# Patient Record
Sex: Female | Born: 1960 | Race: White | Hispanic: No | Marital: Single | Smoking: Former smoker
Health system: Southern US, Community
[De-identification: ages and names within clinical notes are randomized; demographics above are authoritative.]

## PROBLEM LIST (undated history)

## (undated) DIAGNOSIS — M170 Bilateral primary osteoarthritis of knee: Secondary | ICD-10-CM

## (undated) DIAGNOSIS — K5904 Chronic idiopathic constipation: Secondary | ICD-10-CM

## (undated) DIAGNOSIS — K55059 Acute (reversible) ischemia of intestine, part and extent unspecified: Secondary | ICD-10-CM

## (undated) DIAGNOSIS — R109 Unspecified abdominal pain: Secondary | ICD-10-CM

## (undated) DIAGNOSIS — K55011 Focal (segmental) acute (reversible) ischemia of small intestine: Secondary | ICD-10-CM

## (undated) DIAGNOSIS — Z87442 Personal history of urinary calculi: Secondary | ICD-10-CM

## (undated) DIAGNOSIS — I5189 Other ill-defined heart diseases: Secondary | ICD-10-CM

## (undated) DIAGNOSIS — K219 Gastro-esophageal reflux disease without esophagitis: Secondary | ICD-10-CM

## (undated) DIAGNOSIS — E785 Hyperlipidemia, unspecified: Secondary | ICD-10-CM

## (undated) DIAGNOSIS — Z7901 Long term (current) use of anticoagulants: Secondary | ICD-10-CM

## (undated) DIAGNOSIS — G8929 Other chronic pain: Secondary | ICD-10-CM

## (undated) DIAGNOSIS — F419 Anxiety disorder, unspecified: Secondary | ICD-10-CM

## (undated) DIAGNOSIS — J189 Pneumonia, unspecified organism: Secondary | ICD-10-CM

## (undated) DIAGNOSIS — I700 Atherosclerosis of aorta: Secondary | ICD-10-CM

## (undated) DIAGNOSIS — E559 Vitamin D deficiency, unspecified: Secondary | ICD-10-CM

## (undated) DIAGNOSIS — R079 Chest pain, unspecified: Secondary | ICD-10-CM

## (undated) DIAGNOSIS — M5441 Lumbago with sciatica, right side: Secondary | ICD-10-CM

## (undated) DIAGNOSIS — I10 Essential (primary) hypertension: Secondary | ICD-10-CM

## (undated) DIAGNOSIS — I959 Hypotension, unspecified: Secondary | ICD-10-CM

## (undated) DIAGNOSIS — I739 Peripheral vascular disease, unspecified: Secondary | ICD-10-CM

## (undated) DIAGNOSIS — K55069 Acute infarction of intestine, part and extent unspecified: Secondary | ICD-10-CM

## (undated) DIAGNOSIS — I2510 Atherosclerotic heart disease of native coronary artery without angina pectoris: Secondary | ICD-10-CM

## (undated) DIAGNOSIS — Z9049 Acquired absence of other specified parts of digestive tract: Secondary | ICD-10-CM

## (undated) DIAGNOSIS — C801 Malignant (primary) neoplasm, unspecified: Secondary | ICD-10-CM

## (undated) DIAGNOSIS — N390 Urinary tract infection, site not specified: Secondary | ICD-10-CM

## (undated) DIAGNOSIS — K551 Chronic vascular disorders of intestine: Secondary | ICD-10-CM

## (undated) DIAGNOSIS — R002 Palpitations: Secondary | ICD-10-CM

## (undated) DIAGNOSIS — J449 Chronic obstructive pulmonary disease, unspecified: Secondary | ICD-10-CM

## (undated) DIAGNOSIS — R0600 Dyspnea, unspecified: Secondary | ICD-10-CM

## (undated) DIAGNOSIS — C44301 Unspecified malignant neoplasm of skin of nose: Secondary | ICD-10-CM

## (undated) DIAGNOSIS — R7303 Prediabetes: Secondary | ICD-10-CM

## (undated) DIAGNOSIS — Z85828 Personal history of other malignant neoplasm of skin: Secondary | ICD-10-CM

## (undated) DIAGNOSIS — I209 Angina pectoris, unspecified: Secondary | ICD-10-CM

## (undated) DIAGNOSIS — Z6841 Body Mass Index (BMI) 40.0 and over, adult: Secondary | ICD-10-CM

## (undated) DIAGNOSIS — F32A Depression, unspecified: Secondary | ICD-10-CM

## (undated) DIAGNOSIS — I509 Heart failure, unspecified: Secondary | ICD-10-CM

## (undated) DIAGNOSIS — G510 Bell's palsy: Secondary | ICD-10-CM

## (undated) DIAGNOSIS — K56609 Unspecified intestinal obstruction, unspecified as to partial versus complete obstruction: Secondary | ICD-10-CM

## (undated) DIAGNOSIS — R609 Edema, unspecified: Secondary | ICD-10-CM

## (undated) DIAGNOSIS — M1990 Unspecified osteoarthritis, unspecified site: Secondary | ICD-10-CM

## (undated) DIAGNOSIS — M7121 Synovial cyst of popliteal space [Baker], right knee: Secondary | ICD-10-CM

## (undated) DIAGNOSIS — I480 Paroxysmal atrial fibrillation: Secondary | ICD-10-CM

## (undated) DIAGNOSIS — R600 Localized edema: Secondary | ICD-10-CM

## (undated) DIAGNOSIS — N179 Acute kidney failure, unspecified: Secondary | ICD-10-CM

## (undated) DIAGNOSIS — D649 Anemia, unspecified: Secondary | ICD-10-CM

## (undated) DIAGNOSIS — F339 Major depressive disorder, recurrent, unspecified: Secondary | ICD-10-CM

## (undated) HISTORY — DX: Gastro-esophageal reflux disease without esophagitis: K21.9

## (undated) HISTORY — DX: Urinary tract infection, site not specified: N39.0

## (undated) HISTORY — DX: Hypotension, unspecified: I95.9

## (undated) HISTORY — DX: Major depressive disorder, recurrent, unspecified: F33.9

## (undated) HISTORY — PX: CHOLECYSTECTOMY: SHX55

## (undated) HISTORY — PX: APPENDECTOMY: SHX54

## (undated) HISTORY — DX: Focal (segmental) acute (reversible) ischemia of small intestine: K55.011

## (undated) HISTORY — DX: Body Mass Index (BMI) 40.0 and over, adult: Z684

## (undated) HISTORY — DX: Chronic vascular disorders of intestine: K55.1

## (undated) HISTORY — DX: Lumbago with sciatica, right side: M54.41

## (undated) HISTORY — DX: Acute kidney failure, unspecified: N17.9

## (undated) HISTORY — DX: Acute (reversible) ischemia of intestine, part and extent unspecified: K55.059

## (undated) HISTORY — DX: Essential (primary) hypertension: I10

## (undated) HISTORY — DX: Morbid (severe) obesity due to excess calories: E66.01

## (undated) HISTORY — PX: VAGINAL HYSTERECTOMY: SUR661

## (undated) HISTORY — DX: Bell's palsy: G51.0

## (undated) HISTORY — DX: Unspecified abdominal pain: R10.9

## (undated) HISTORY — DX: Chronic idiopathic constipation: K59.04

## (undated) HISTORY — DX: Bilateral primary osteoarthritis of knee: M17.0

## (undated) HISTORY — DX: Acute infarction of intestine, part and extent unspecified: K55.069

## (undated) HISTORY — DX: Hyperlipidemia, unspecified: E78.5

## (undated) HISTORY — DX: Personal history of other malignant neoplasm of skin: Z85.828

## (undated) HISTORY — DX: Unspecified intestinal obstruction, unspecified as to partial versus complete obstruction: K56.609

## (undated) HISTORY — PX: COLON SURGERY: SHX602

---

## 2003-12-12 ENCOUNTER — Other Ambulatory Visit: Payer: Self-pay

## 2004-05-20 ENCOUNTER — Other Ambulatory Visit: Payer: Self-pay

## 2005-05-03 ENCOUNTER — Emergency Department: Payer: Self-pay | Admitting: Emergency Medicine

## 2005-05-04 ENCOUNTER — Emergency Department: Payer: Self-pay | Admitting: Emergency Medicine

## 2005-05-10 ENCOUNTER — Emergency Department: Payer: Self-pay | Admitting: Emergency Medicine

## 2005-12-23 ENCOUNTER — Emergency Department: Payer: Self-pay | Admitting: Emergency Medicine

## 2005-12-23 ENCOUNTER — Other Ambulatory Visit: Payer: Self-pay

## 2006-10-24 IMAGING — CR LEFT THUMB 2+V
1 series · 1 of 1 positions shown · non-contrast
Comparison: none

REASON FOR EXAM: Injury
COMMENTS:

PROCEDURE:     DXR - DXR THUMB LEFT HAND (1ST DIGIT)  - May 10, 2005 [DATE]
RESULT:     Multiple views of the LEFT thumb show no evidence of fracture,
foreign body or soft tissue swelling.

[view not recorded]
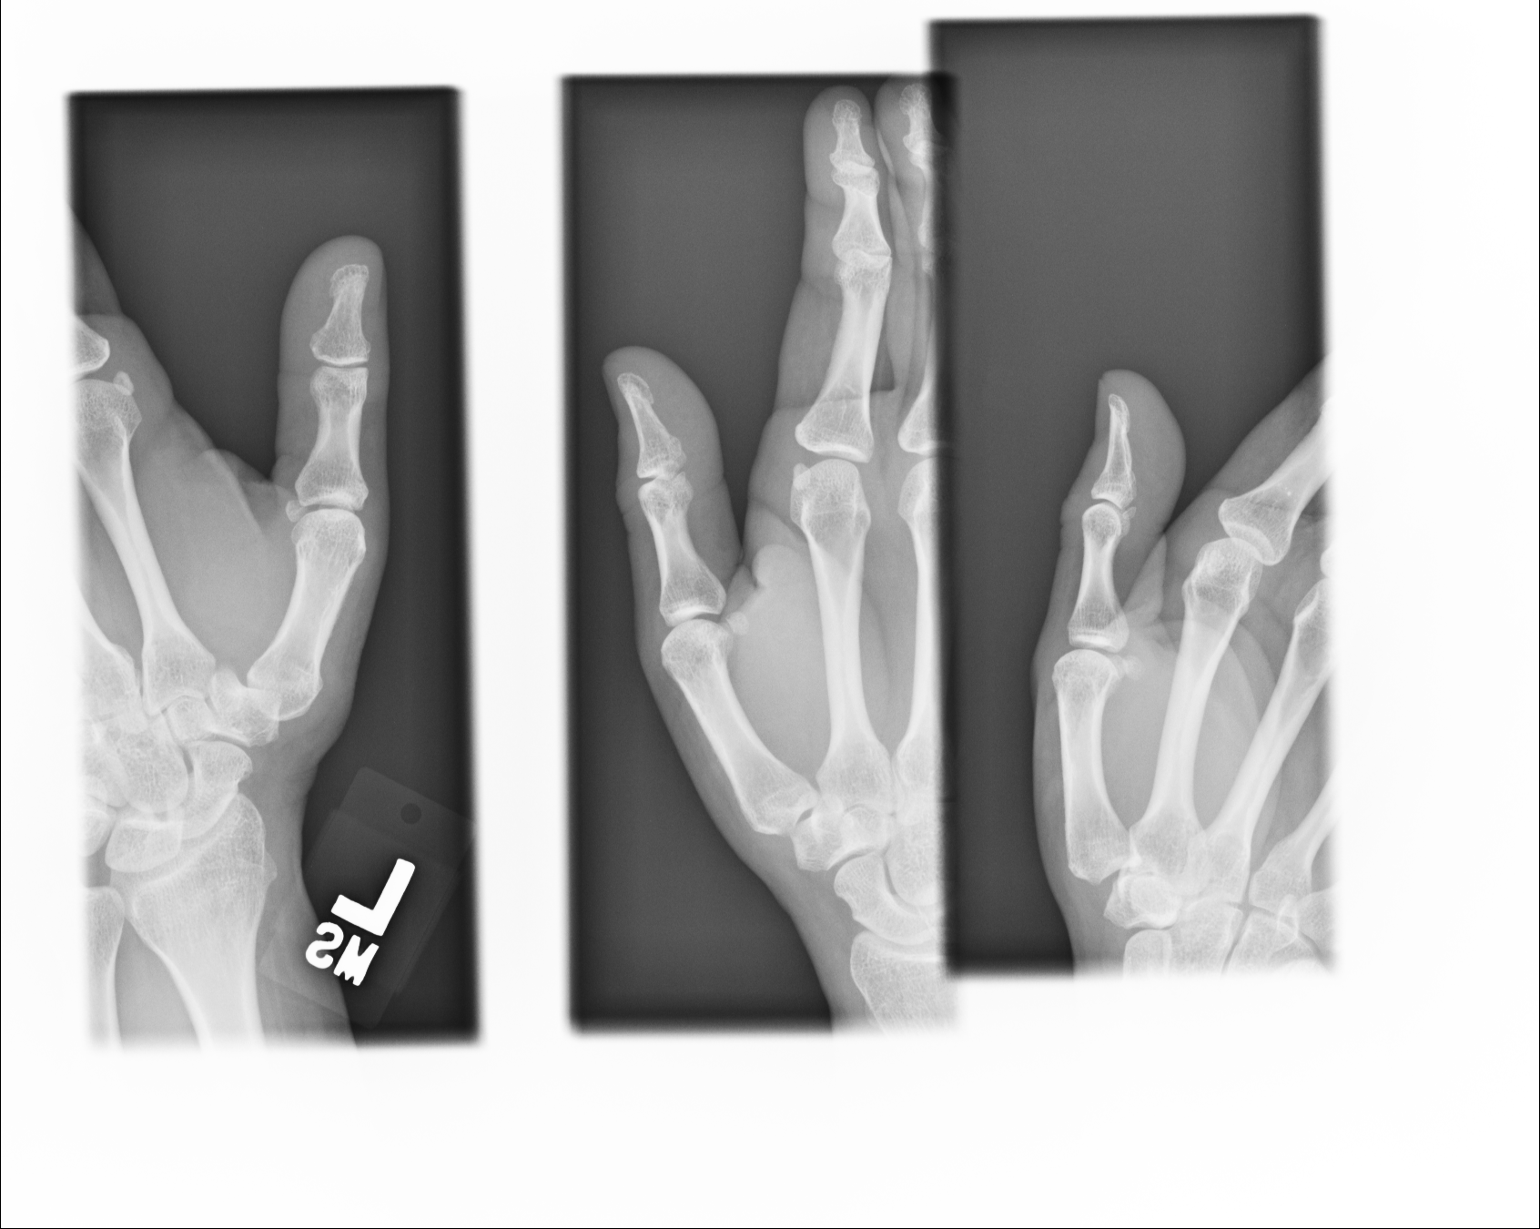

[1 of 1 positions shown; findings below may reference images not displayed]

IMPRESSION: Normal LEFT thumb.

## 2006-11-10 ENCOUNTER — Emergency Department: Payer: Self-pay | Admitting: Internal Medicine

## 2006-12-08 ENCOUNTER — Emergency Department: Payer: Self-pay | Admitting: General Practice

## 2007-07-04 ENCOUNTER — Emergency Department: Payer: Self-pay | Admitting: Emergency Medicine

## 2008-04-16 ENCOUNTER — Ambulatory Visit: Payer: Self-pay | Admitting: Family Medicine

## 2008-04-16 ENCOUNTER — Emergency Department: Payer: Self-pay | Admitting: Emergency Medicine

## 2008-05-06 ENCOUNTER — Emergency Department: Payer: Self-pay | Admitting: Unknown Physician Specialty

## 2008-06-24 ENCOUNTER — Emergency Department: Payer: Self-pay

## 2008-09-30 ENCOUNTER — Ambulatory Visit: Payer: Self-pay

## 2008-10-21 ENCOUNTER — Ambulatory Visit: Payer: Self-pay

## 2008-11-17 ENCOUNTER — Emergency Department: Payer: Self-pay | Admitting: Emergency Medicine

## 2008-12-17 IMAGING — CR DG KNEE 1-2V*R*
1 series · 2 of 2 positions shown · non-contrast
Comparison: none

REASON FOR EXAM: Fall
COMMENTS:

PROCEDURE:     DXR - DXR KNEE RIGHT AP AND LATERAL  - July 04, 2007 [DATE]
RESULT:     Images of the RIGHT knee demonstrate no definite fracture,
dislocation or radiopaque foreign body. Mild degenerative changes are
present.

[Series 1: view not recorded · 0.17mm/px · 2 of 2 slices shown]
[im 1/2]
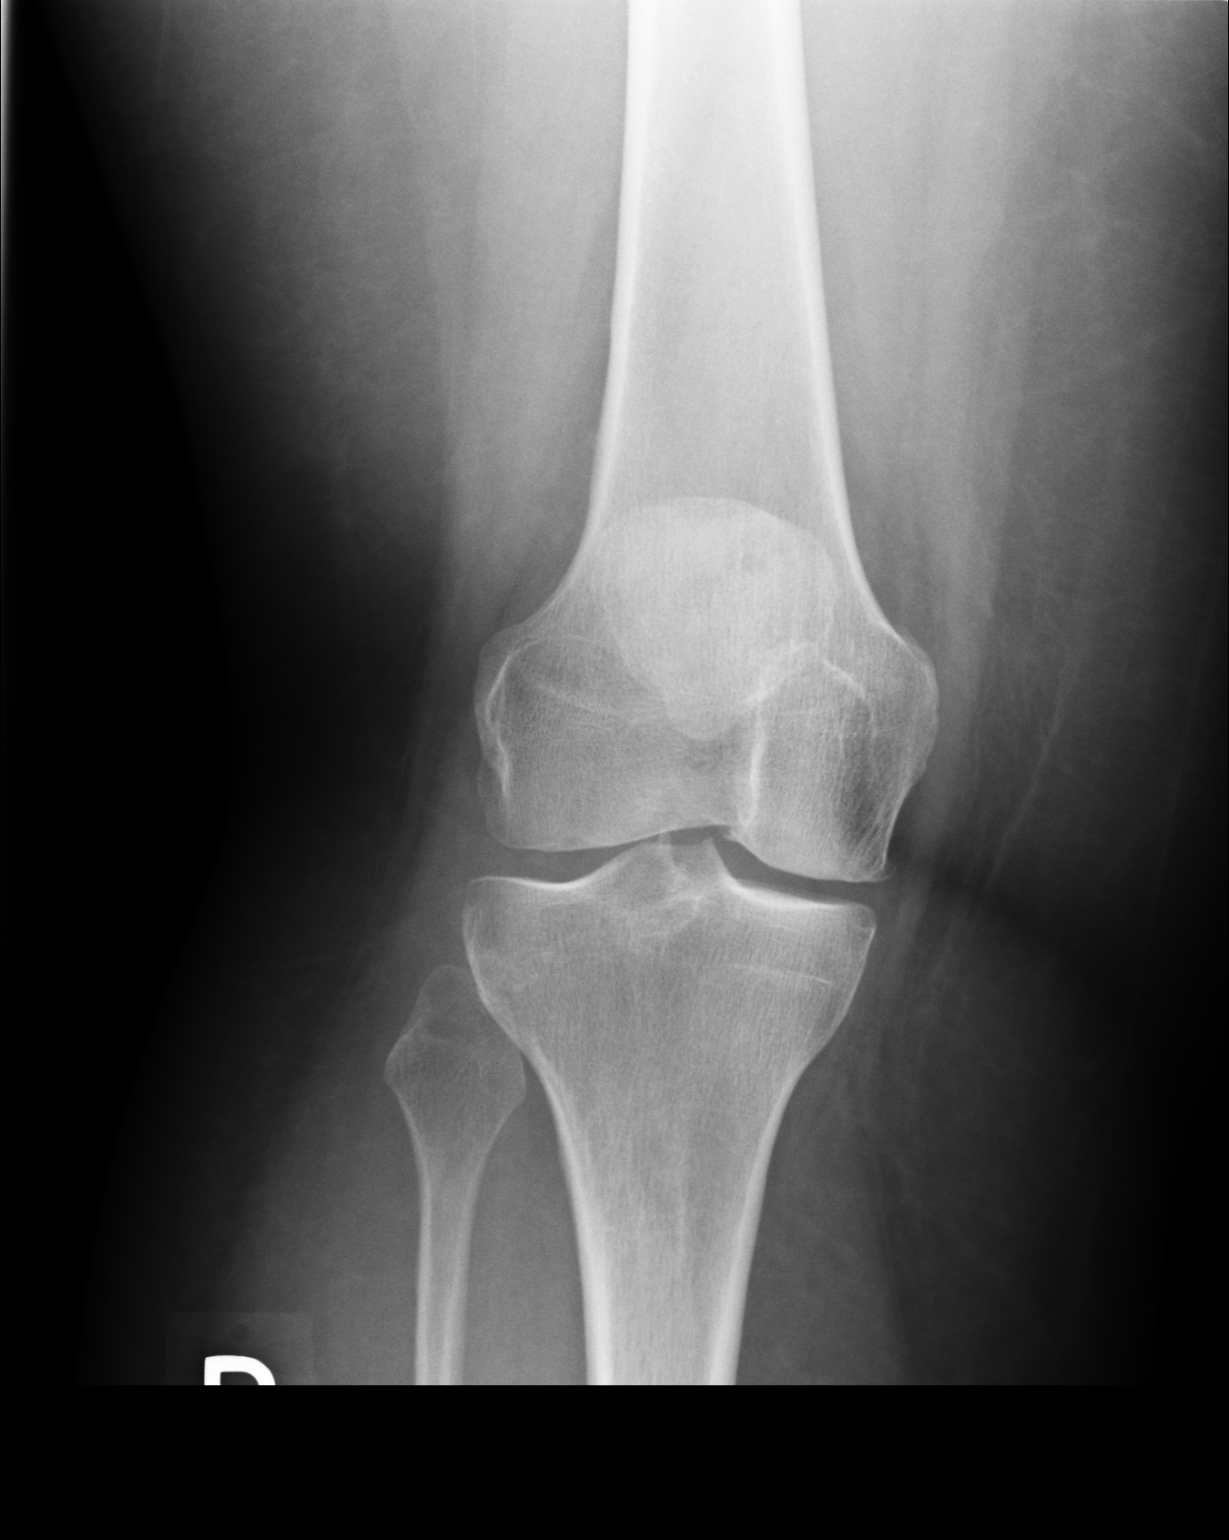
[im 2/2]
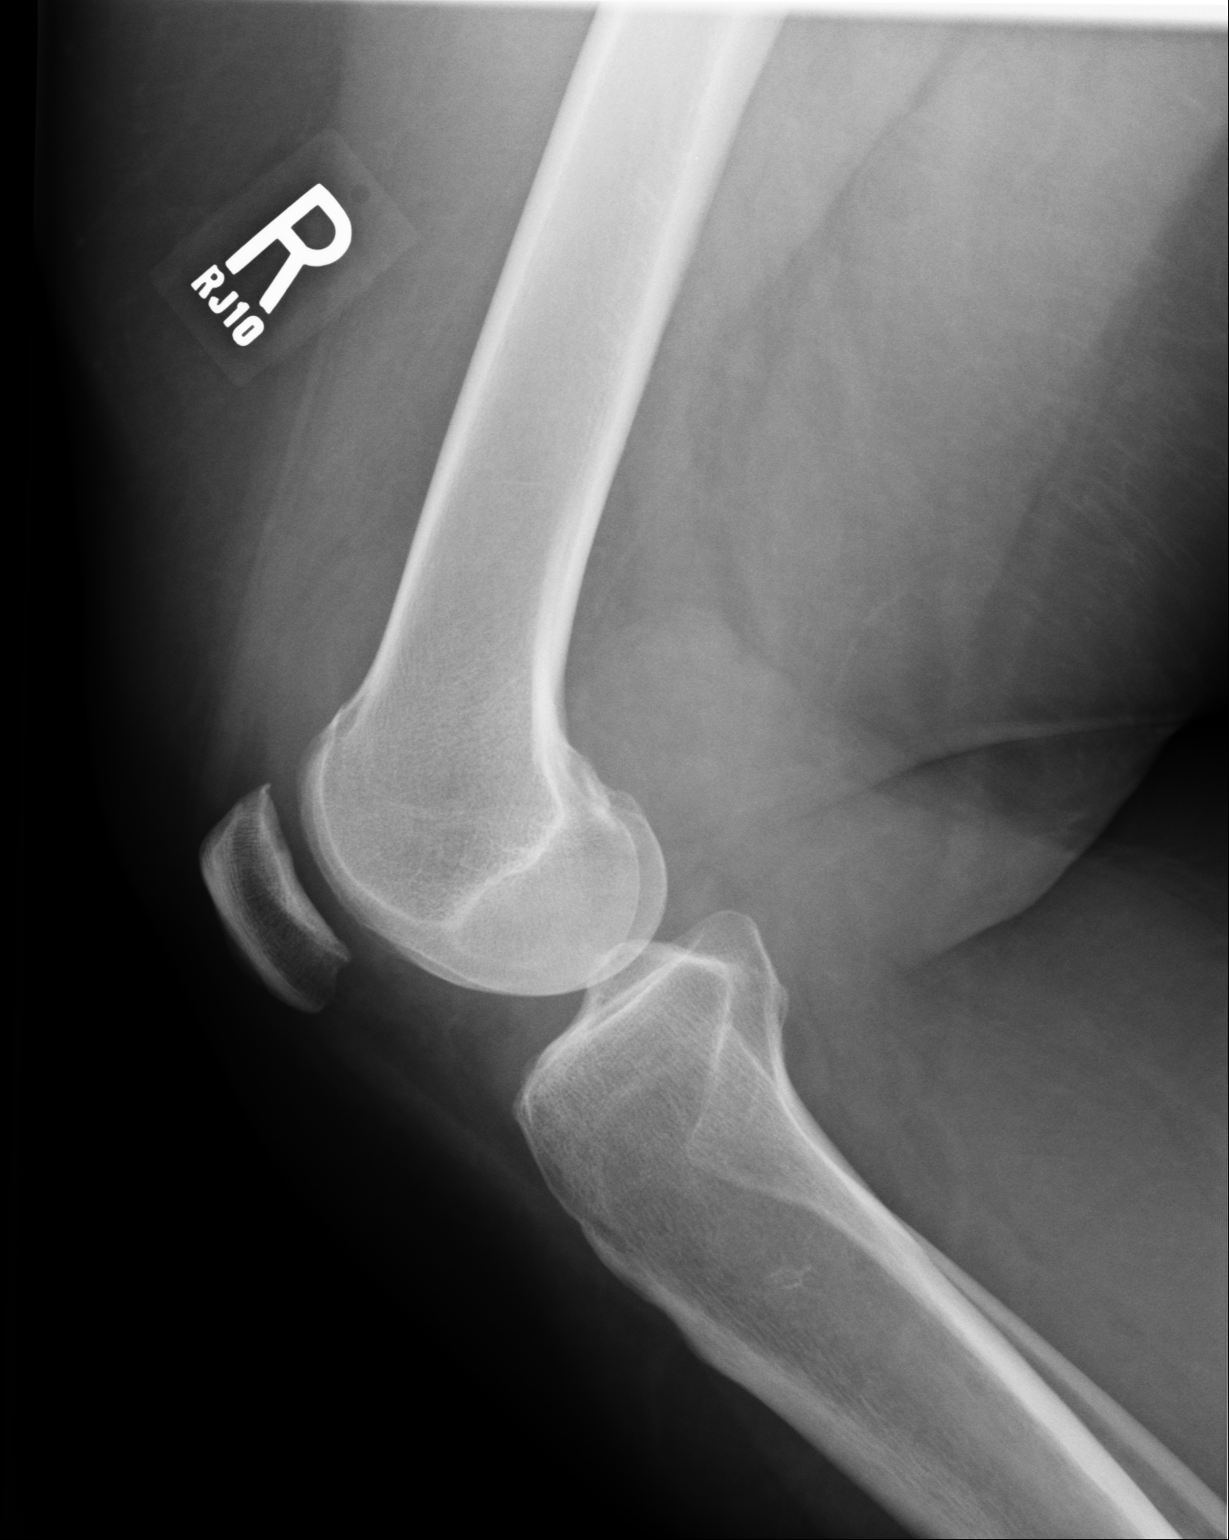

[2 of 2 positions shown; findings below may reference images not displayed]

IMPRESSION: No acute bony abnormality is demonstrated.

## 2008-12-17 IMAGING — CR LEFT INDEX FINGER 2+V
1 series · 3 of 3 positions shown · non-contrast
Comparison: none

REASON FOR EXAM: fall
COMMENTS:

PROCEDURE:     DXR - DXR FINGER INDEX 2ND DIGIT LT HA  - July 04, 2007 [DATE]
RESULT:     Images of the LEFT index finger demonstrate no fracture,
dislocation or radiopaque foreign body evident.

[Series 1: view not recorded · 0.17mm/px · 3 of 3 slices shown]
[im 1/3]
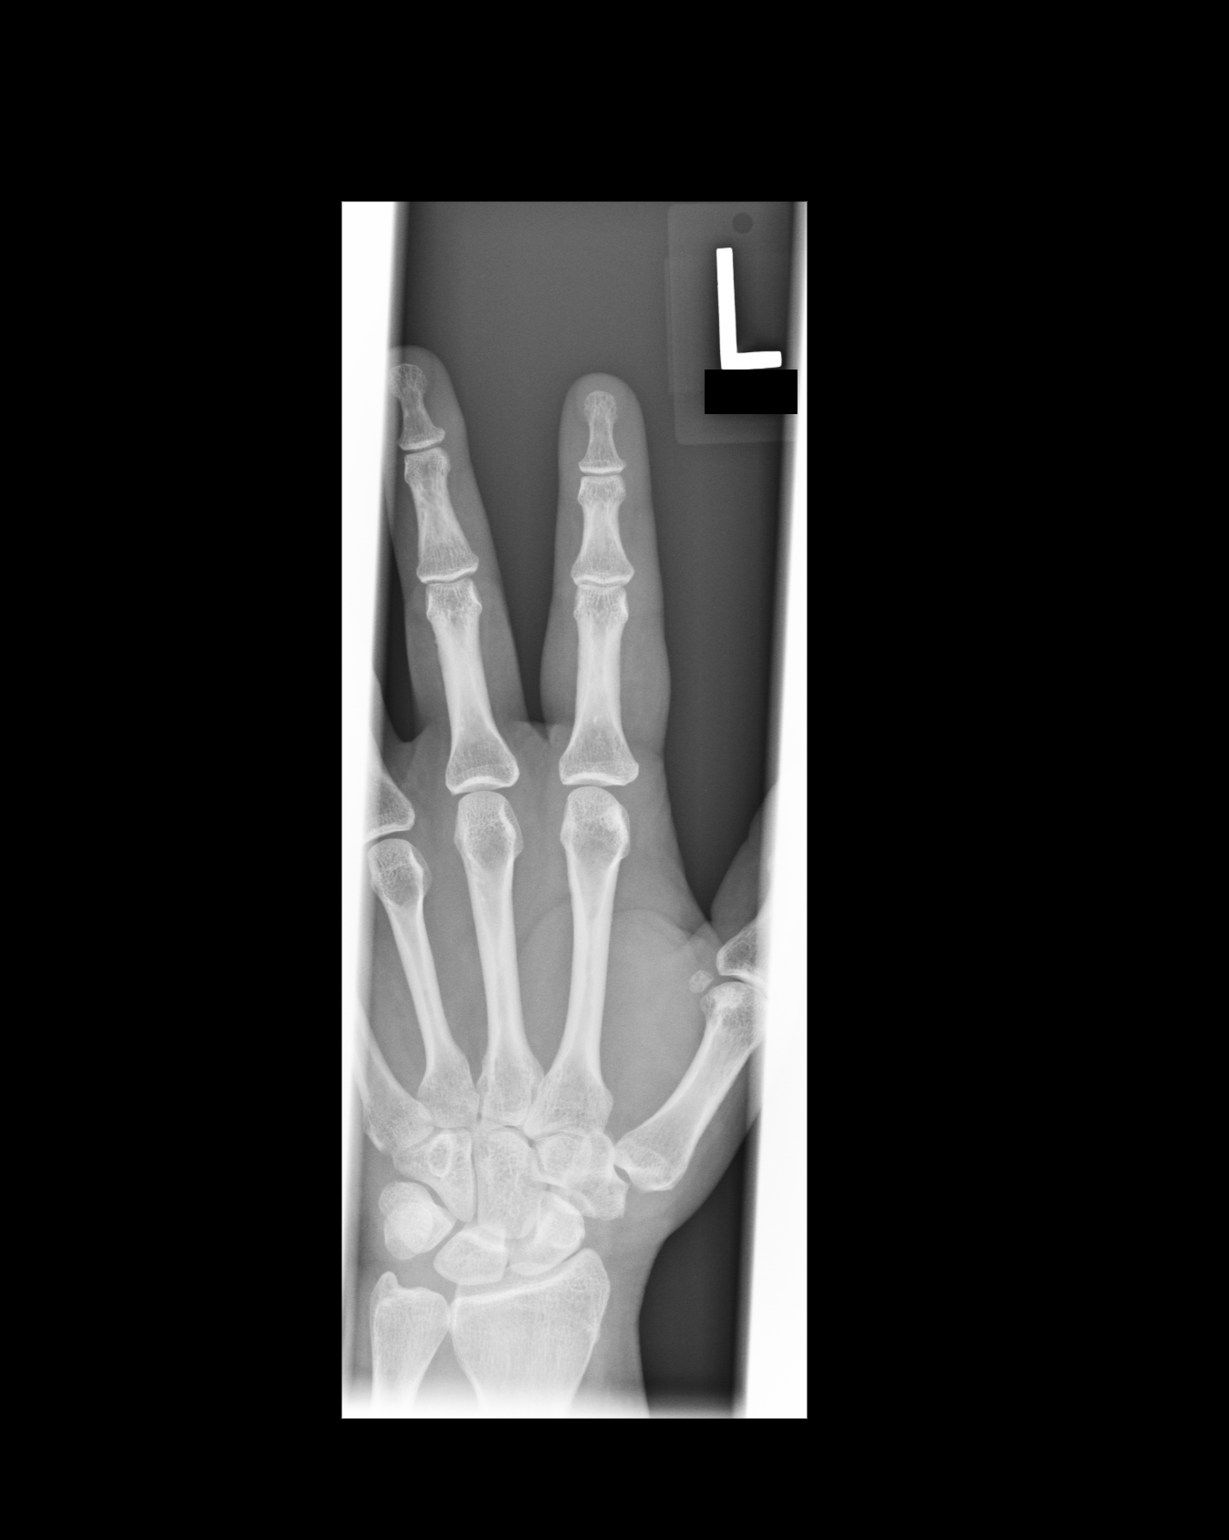
[im 2/3]
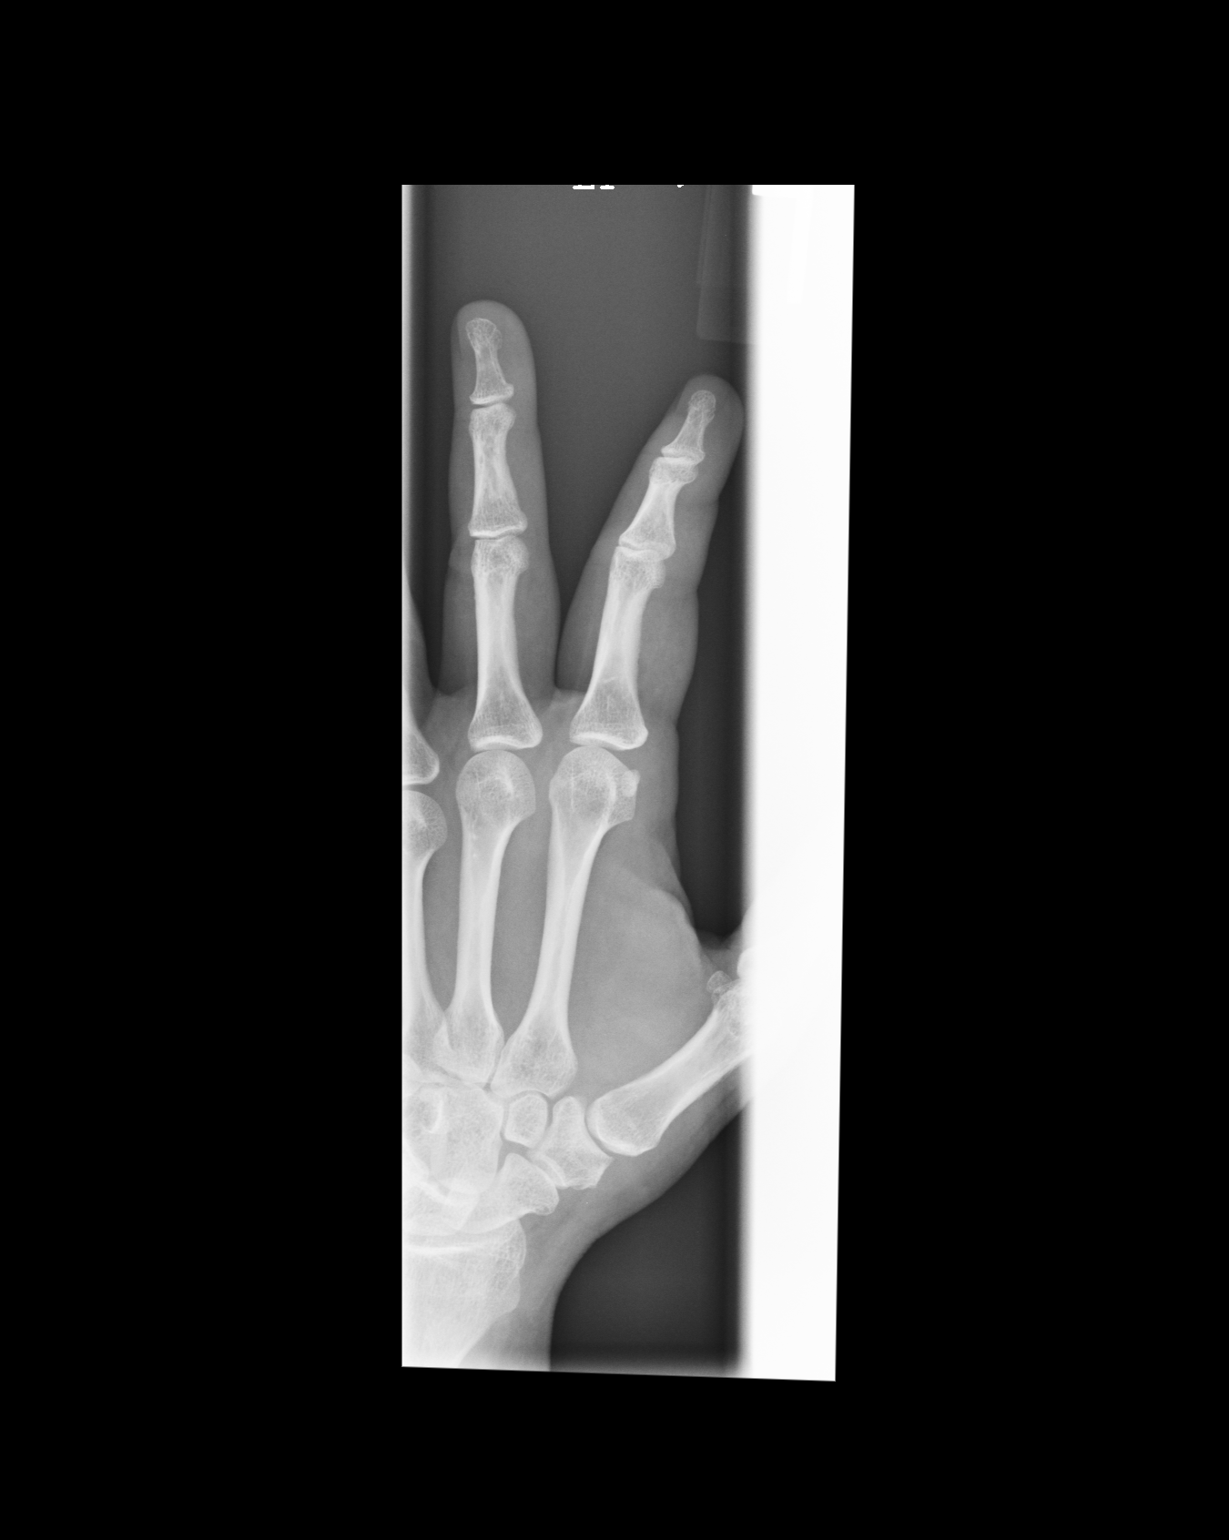
[im 3/3]
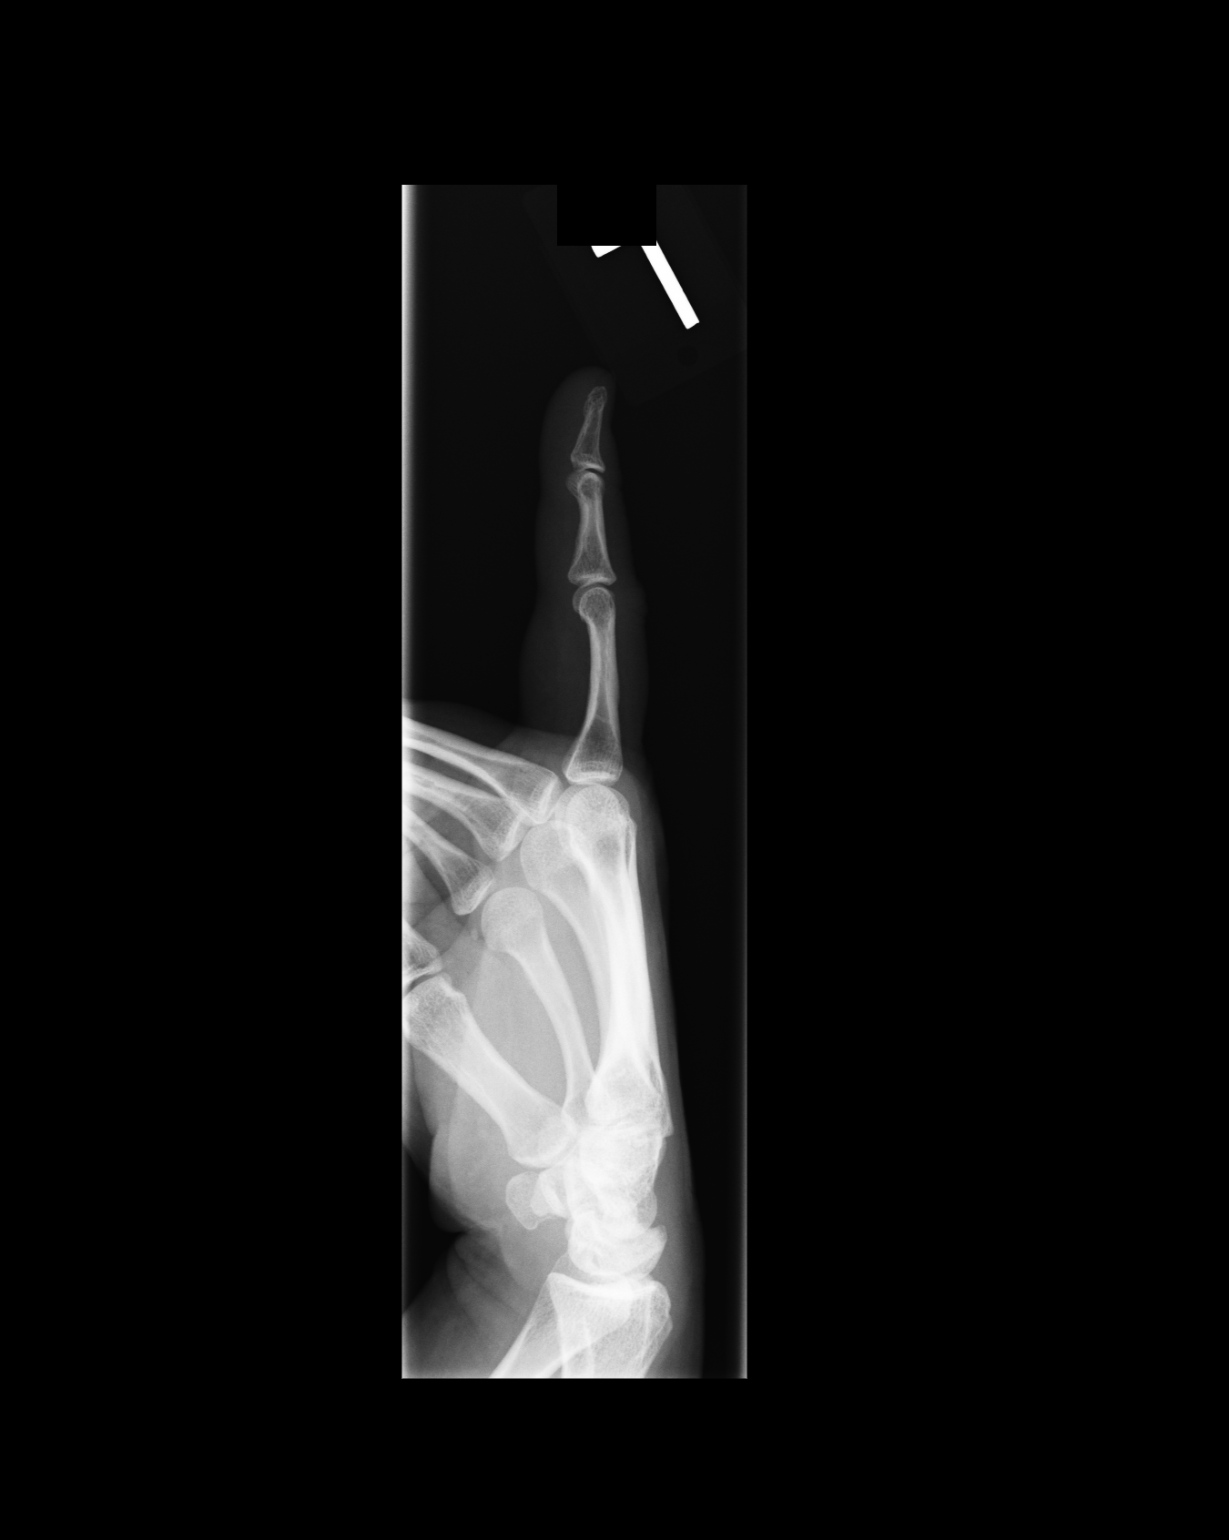

[3 of 3 positions shown; findings below may reference images not displayed]

IMPRESSION: Please see above.

## 2009-04-22 ENCOUNTER — Ambulatory Visit: Payer: Self-pay

## 2009-09-30 IMAGING — CR DG FOOT COMPLETE 3+V*L*
1 series · 3 of 3 positions shown · non-contrast
Comparison: none

REASON FOR EXAM: pain lt foot
COMMENTS:

[Series 1: view not recorded · 0.17mm/px · 3 of 3 slices shown]
[im 1/3]
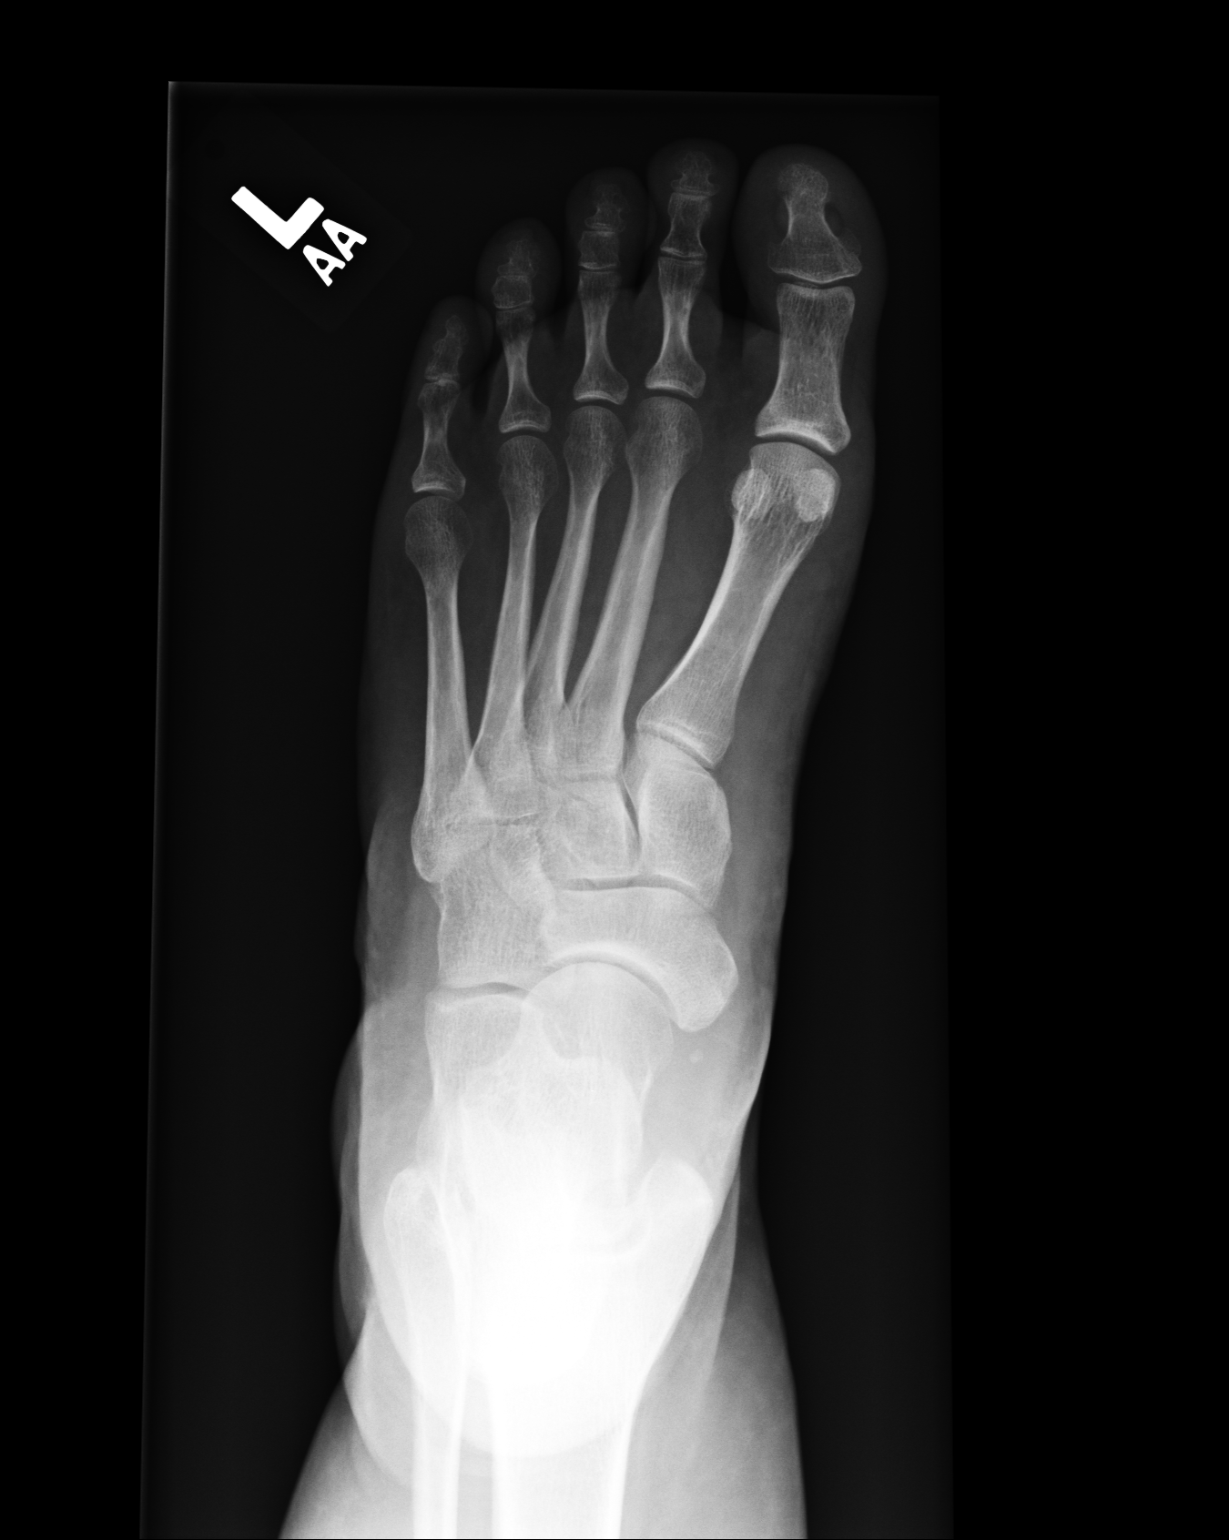
[im 2/3]
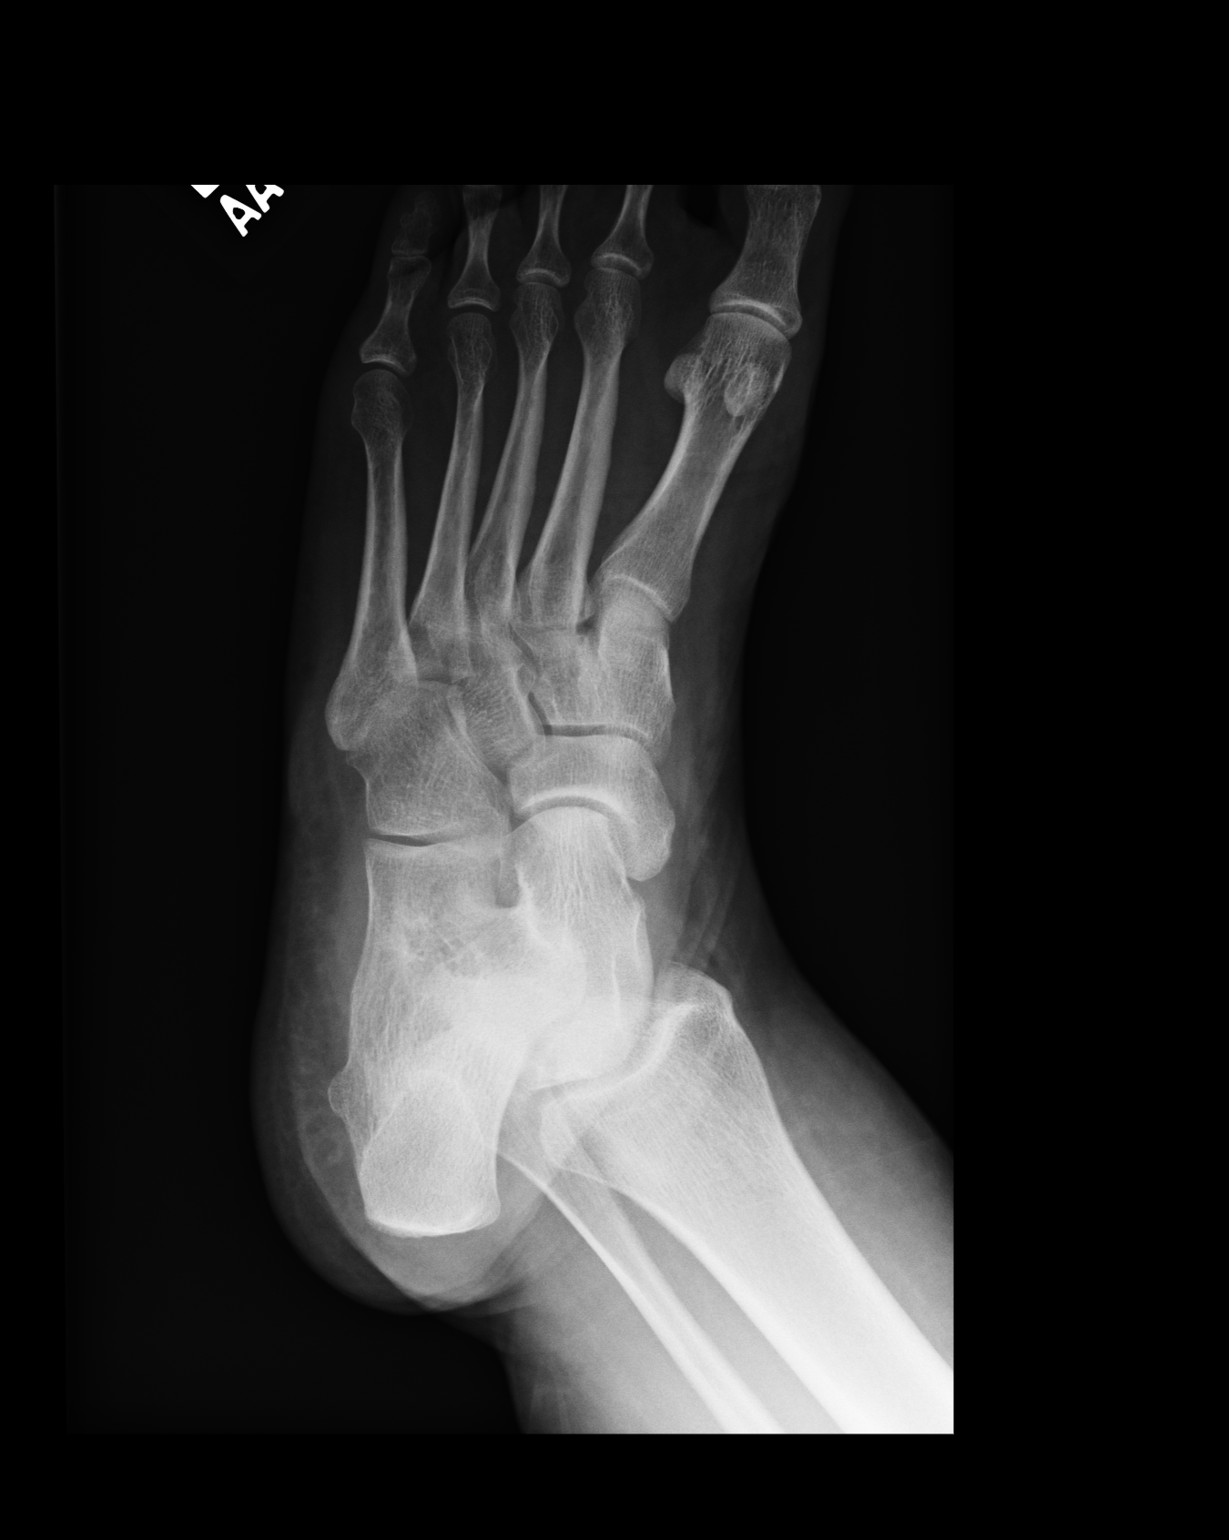
[im 3/3]
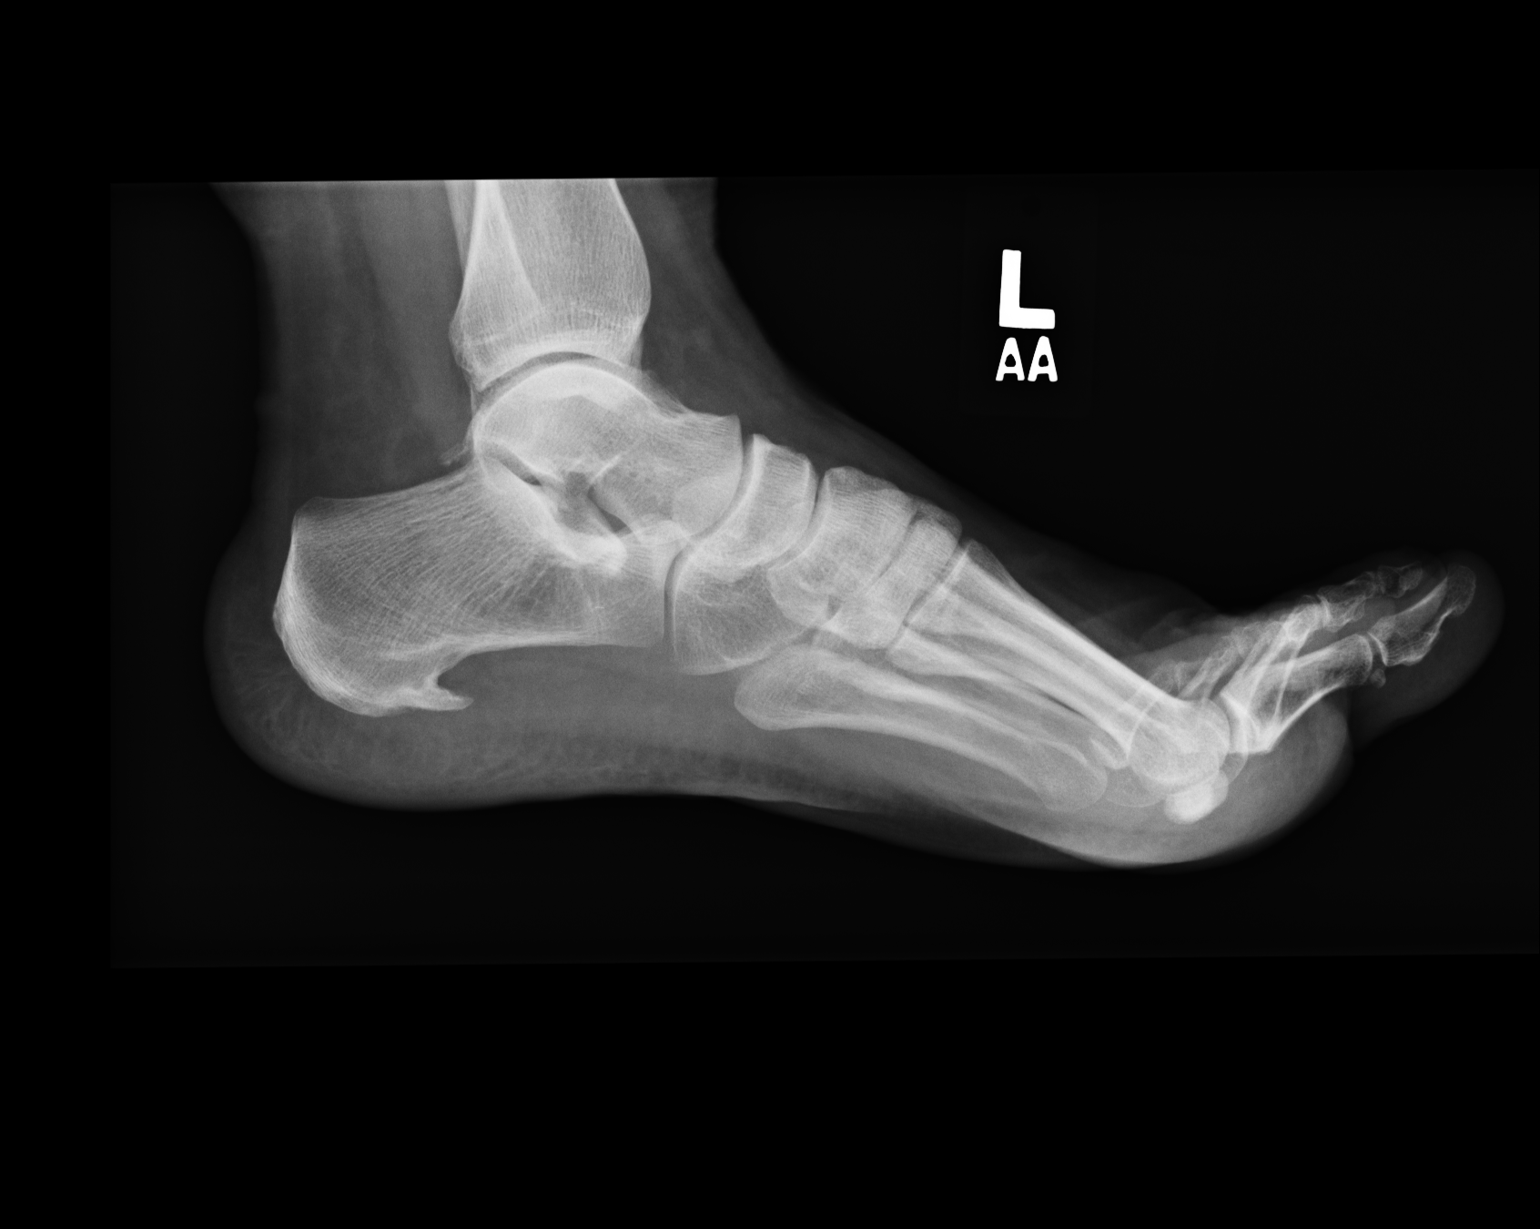

[3 of 3 positions shown; findings below may reference images not displayed]

PROCEDURE:     DXR - DXR FOOT LT COMP W/OBLIQUES  - April 16, 2008  [DATE]

RESULT:     No fracture, dislocation or other acute bony abnormality is
identified. Particular attention to the MP joint of the great toe shows no
erosive or arthritic changes to radiographically suggest gout. In the
lateral view, a plantar calcaneal spur is noted.
IMPRESSION: 1. No acute changes are identified.
2. A plantar calcaneal spur is observed.

## 2009-09-30 IMAGING — CR DG ANKLE COMPLETE 3+V*L*
1 series · 4 of 4 positions shown · non-contrast
Comparison: none

REASON FOR EXAM: lt ankle pain
COMMENTS:

PROCEDURE:     DXR - DXR ANKLE LEFT COMPLETE  - April 16, 2008  [DATE]
RESULT:     No fracture, dislocation or other acute bony abnormality is
seen. The ankle mortise is well maintained. No erosive or cystic changes are
observed. In the lateral view, there is noted a plantar calcaneal spur.

[Series 1: view not recorded · 0.17mm/px · 4 of 4 slices shown]
[im 1/4]
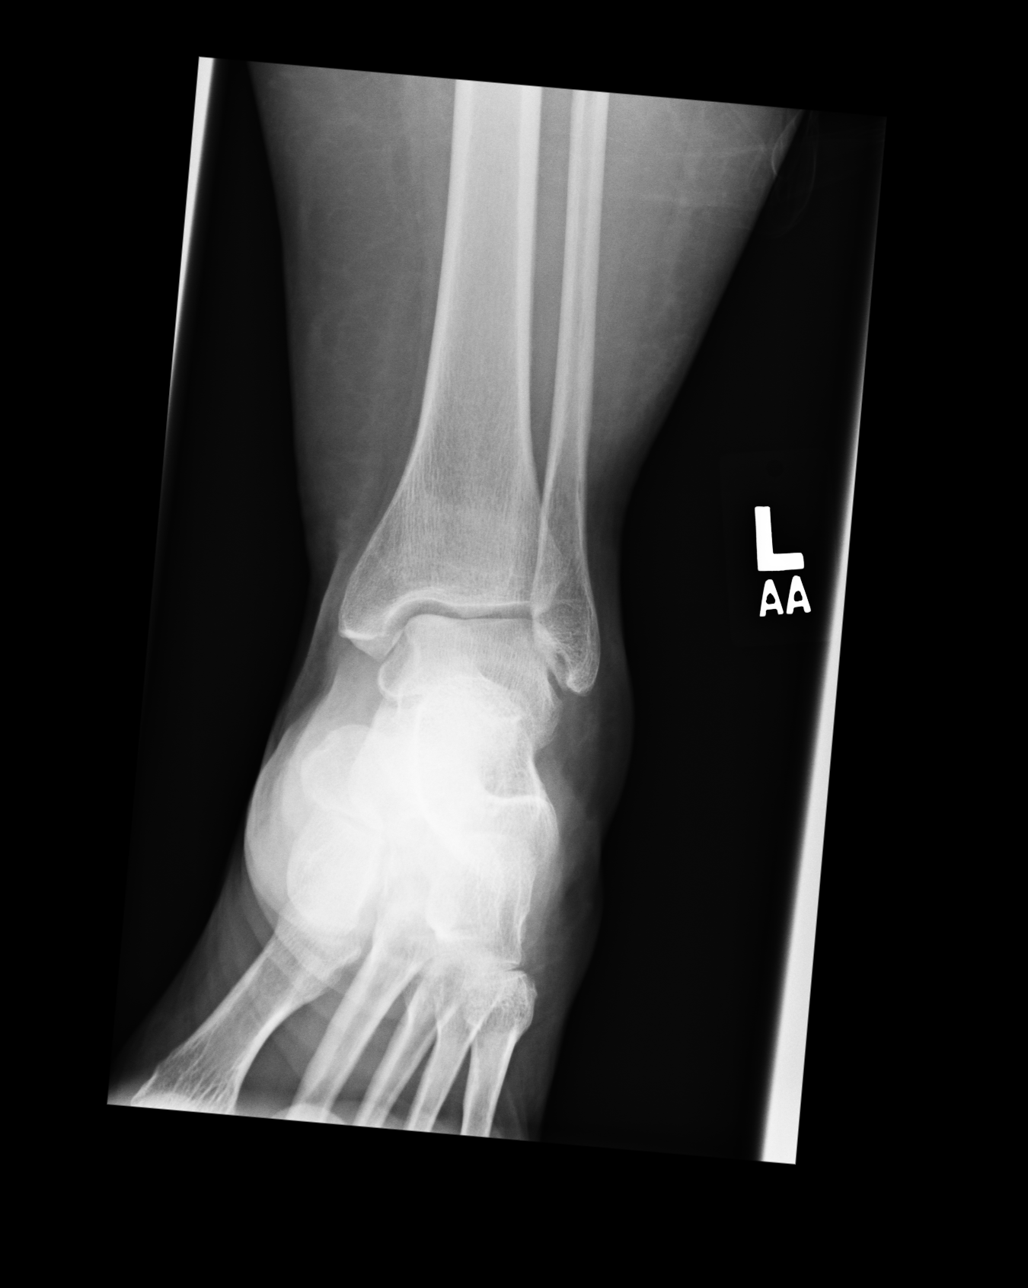
[im 2/4]
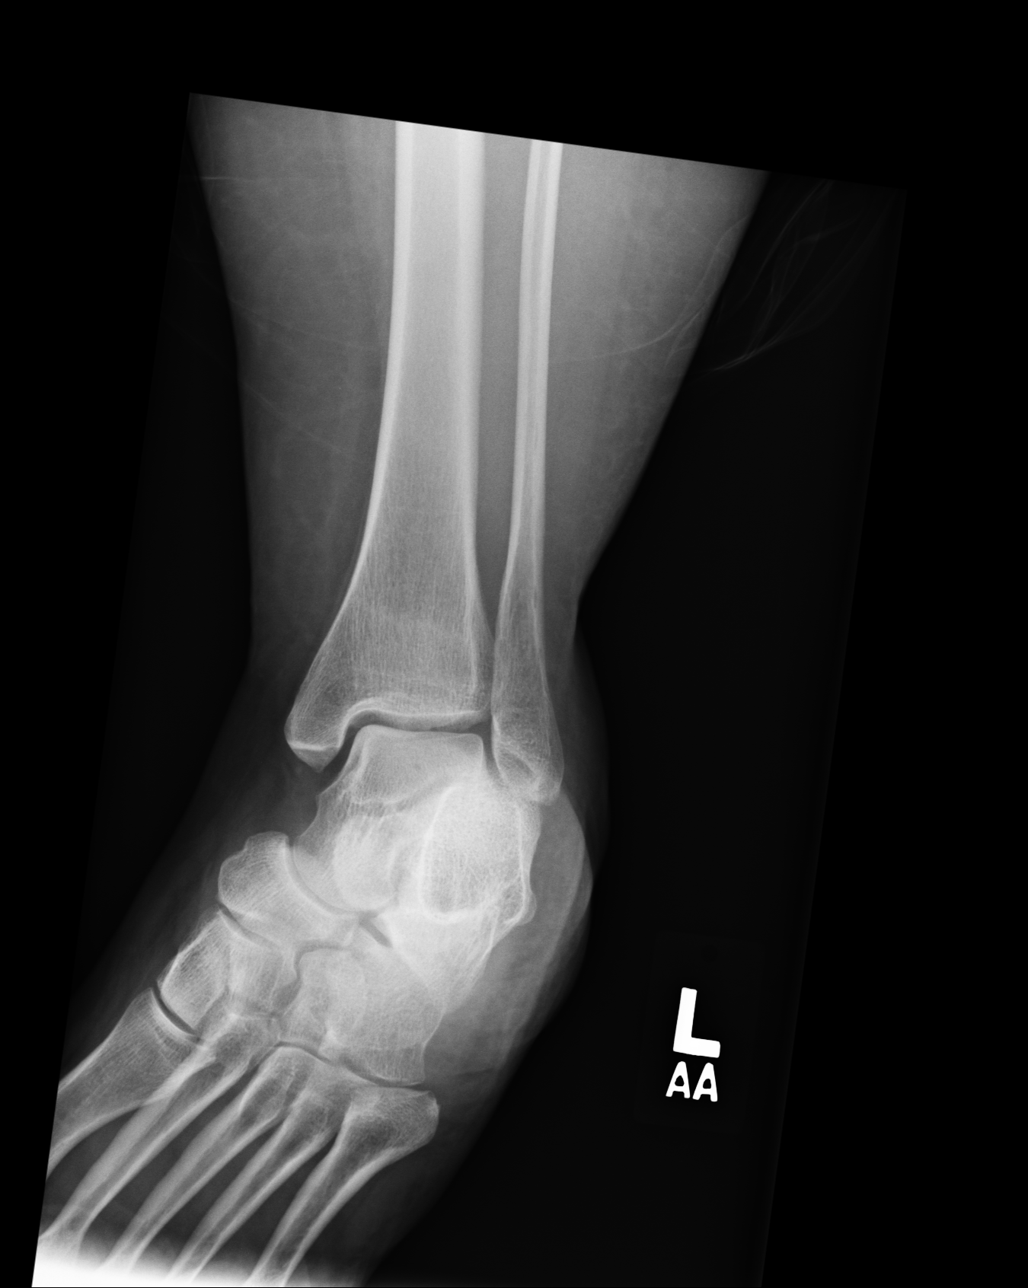
[im 3/4]
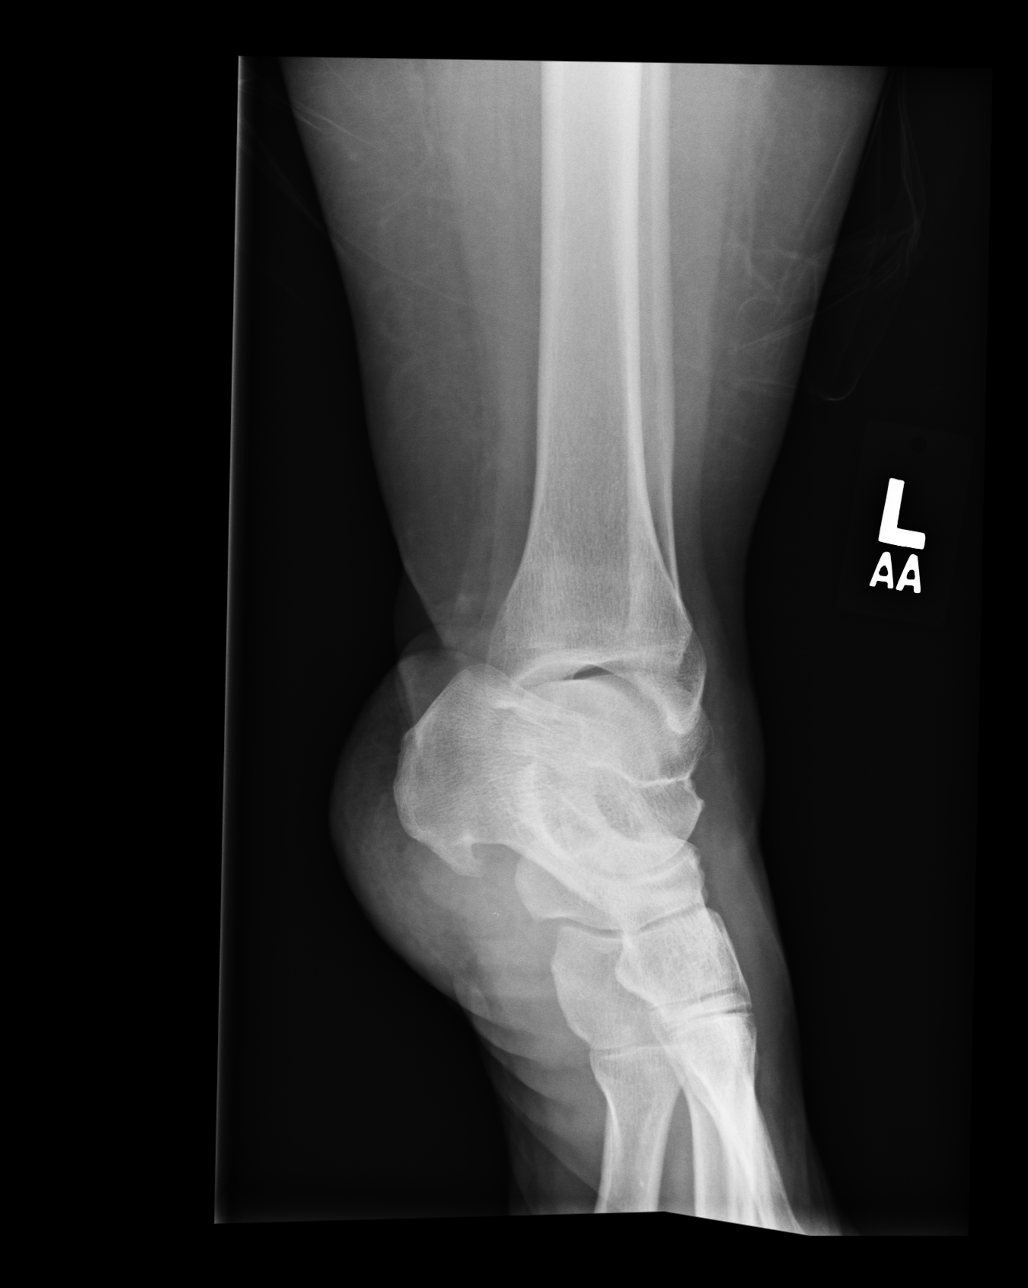
[im 4/4]
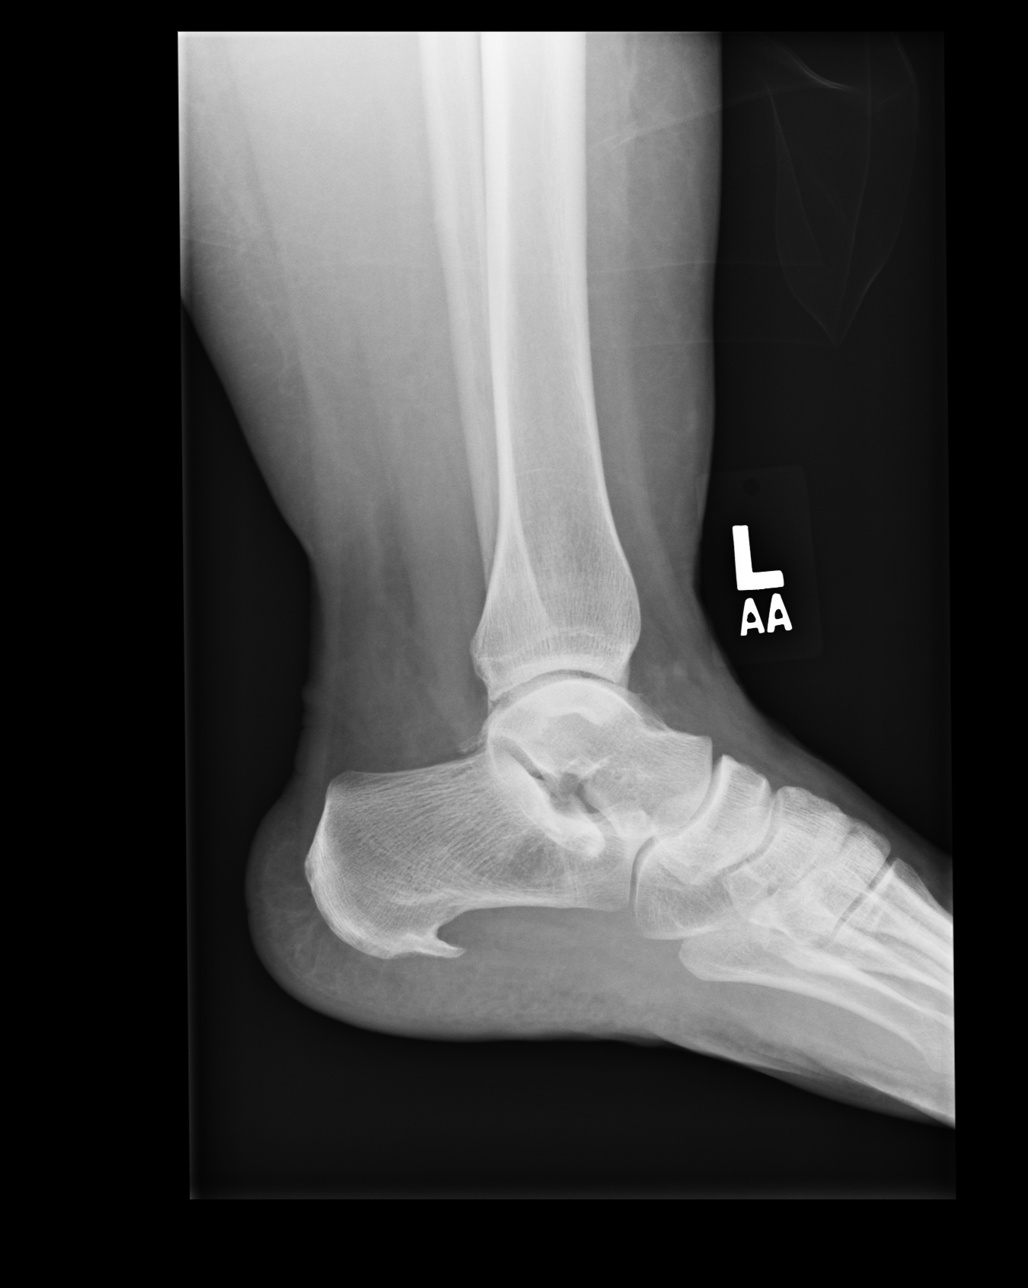

[4 of 4 positions shown; findings below may reference images not displayed]

IMPRESSION: 1. No significant osseous abnormalities about the LEFT ankle are identified.
2. There is a plantar calcaneal spur.

## 2009-12-08 IMAGING — CR DG LUMBAR SPINE 2-3V
1 series · 3 of 3 positions shown · non-contrast
Comparison: none

REASON FOR EXAM: back pain
COMMENTS:

[Series 1: view not recorded · 0.17mm/px · 3 of 3 slices shown]
[im 1/3]
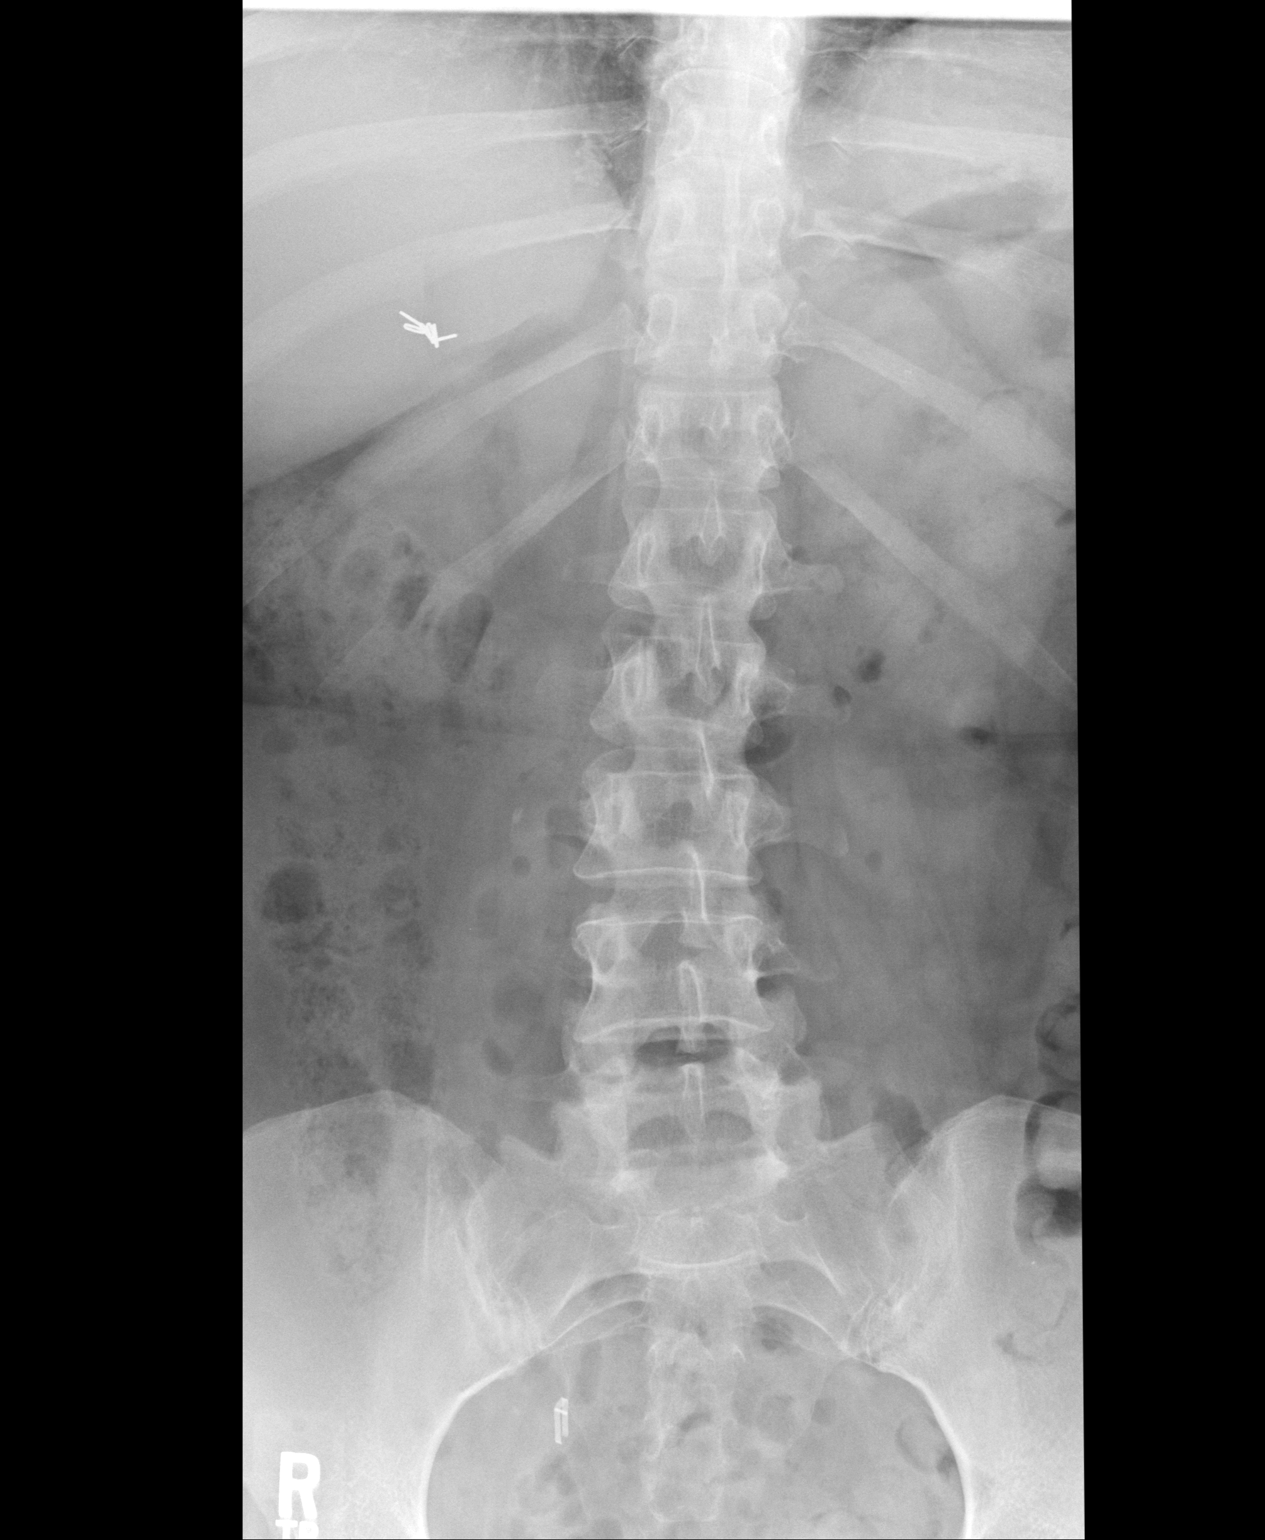
[im 2/3]
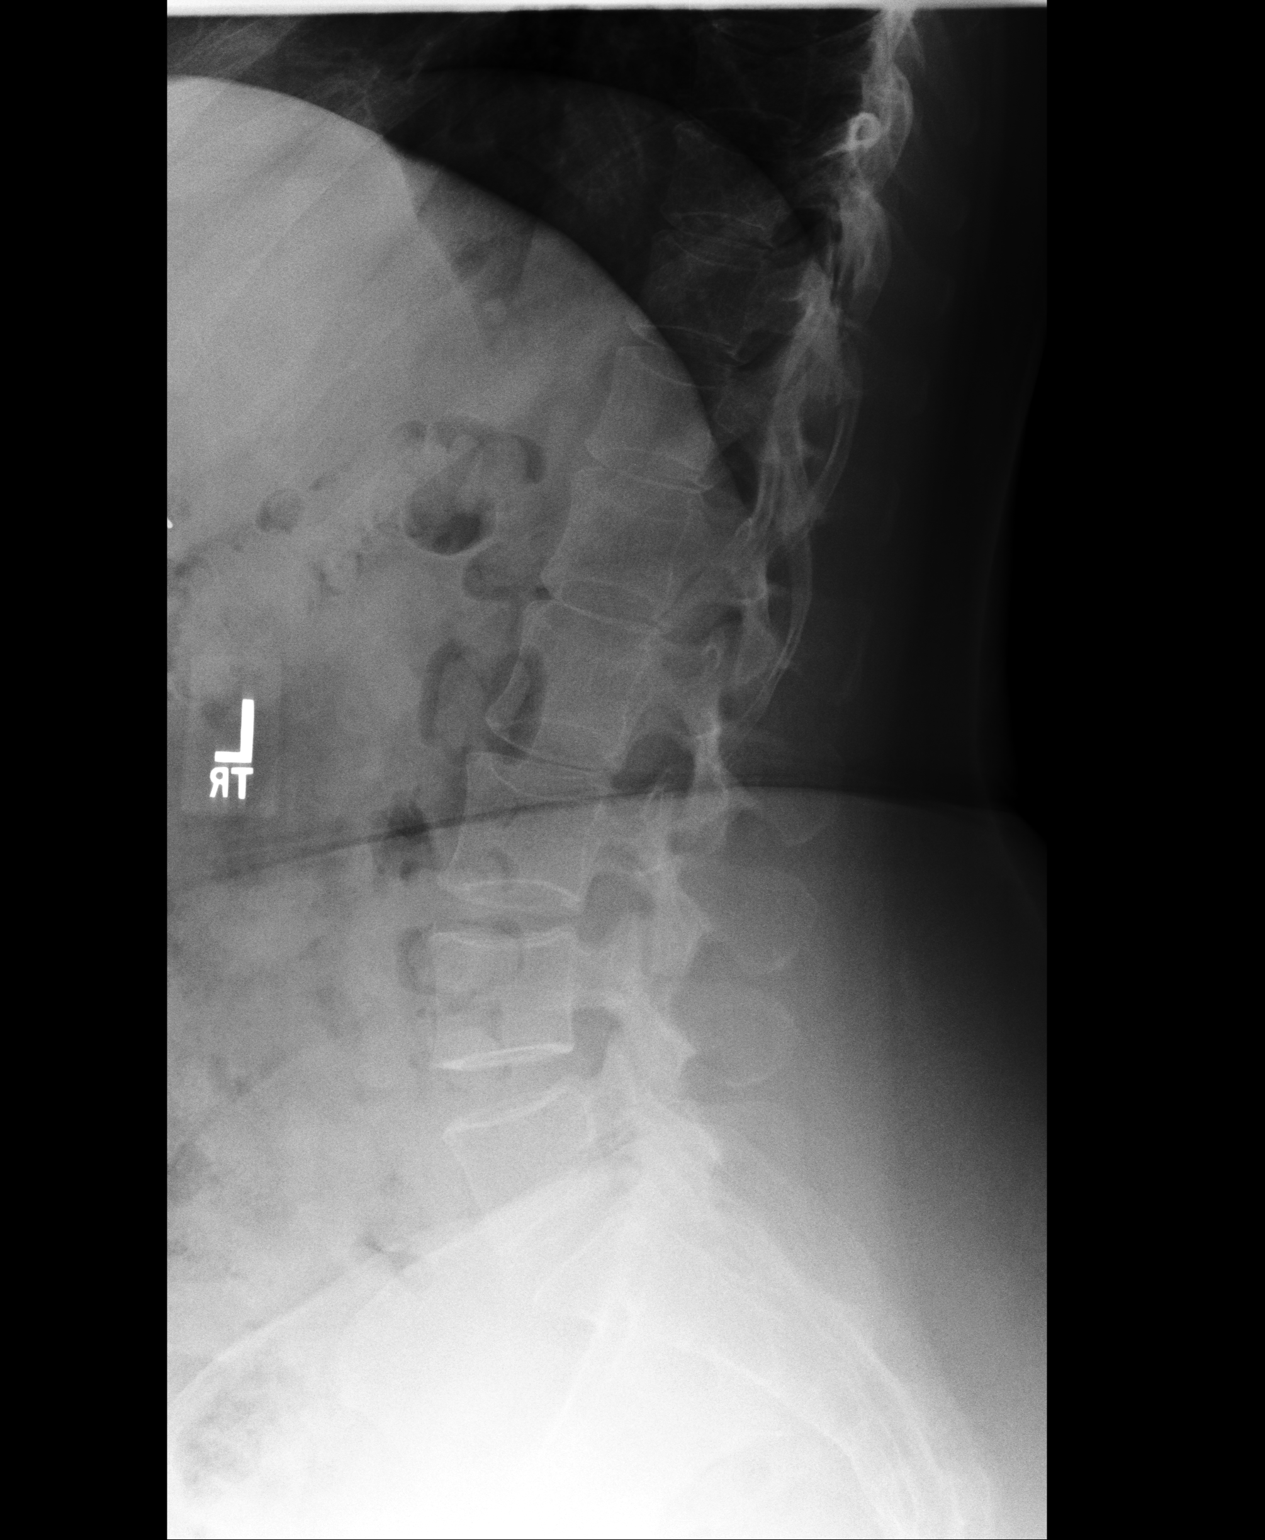
[im 3/3]
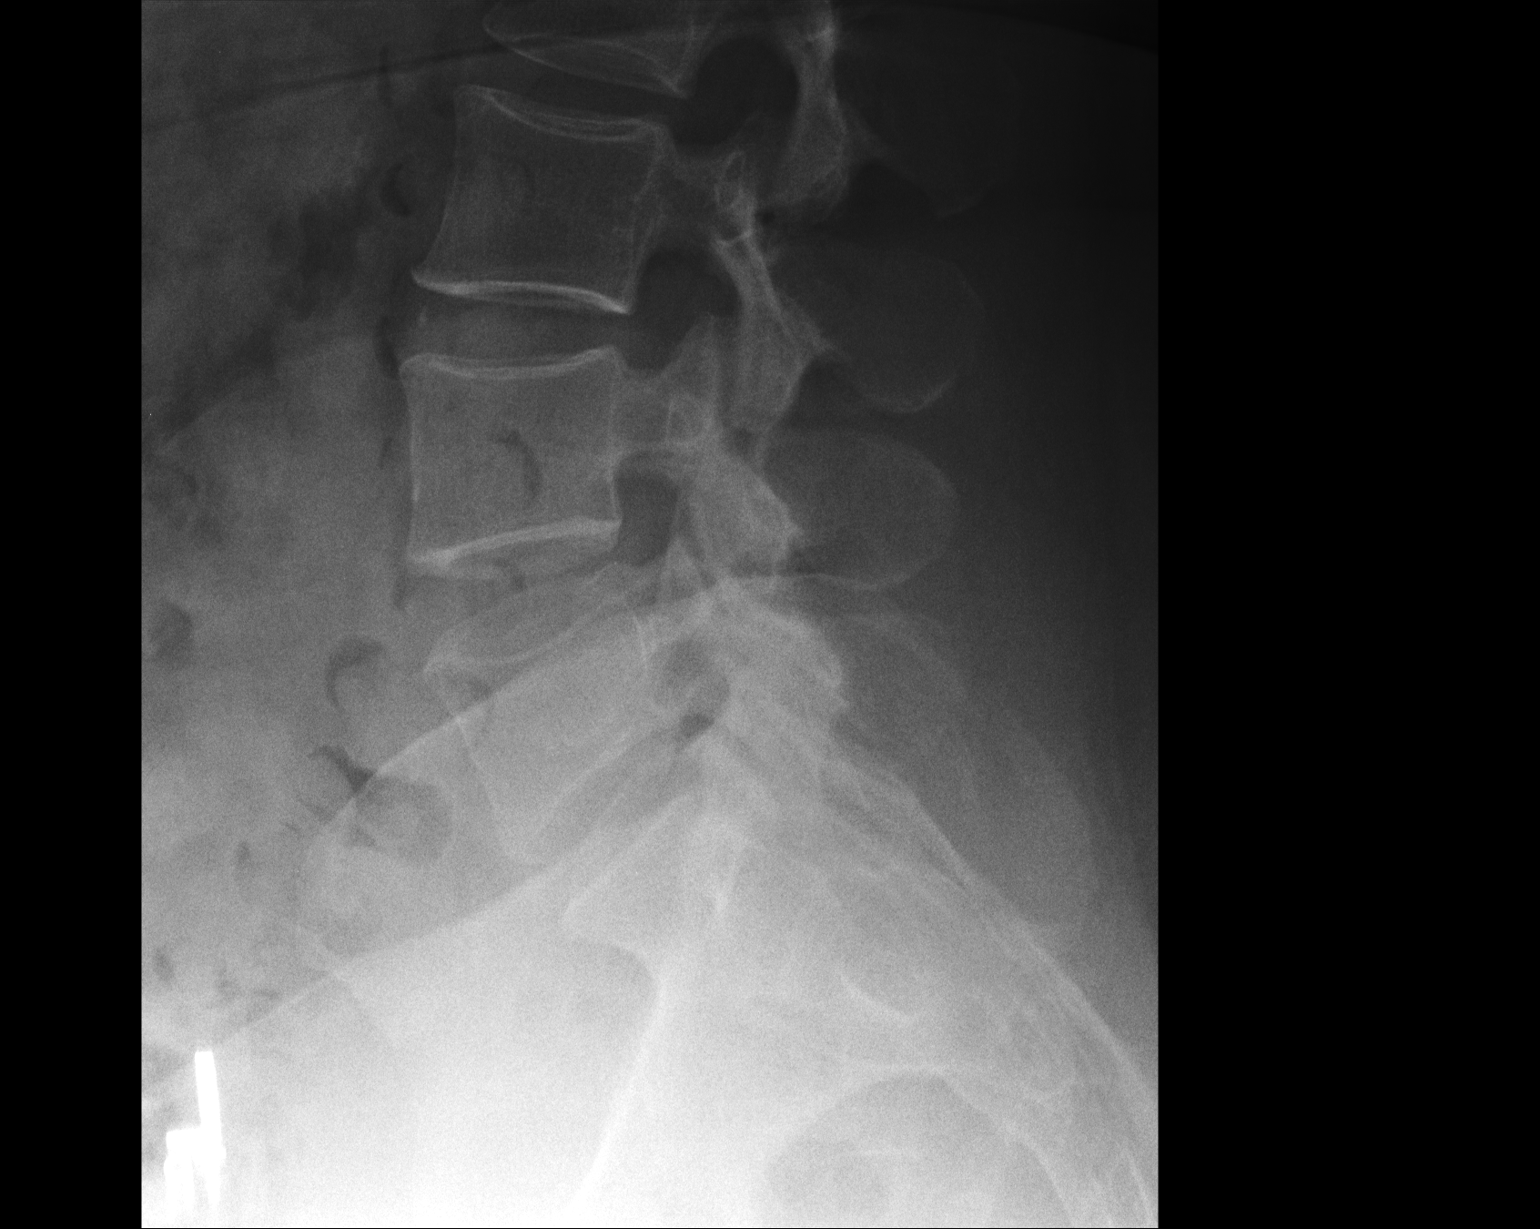

[3 of 3 positions shown; findings below may reference images not displayed]

PROCEDURE:     DXR - DXR LUMBAR SPINE AP AND LATERAL  - June 24, 2008  [DATE]

RESULT:     No acute or focal bony abnormality is identified.  Calcification
is noted over the RIGHT L3 transverse process. This could represent a
ureteral stone or bony calcification.  The patient has had a prior
cholecystectomy.  Tubal ligation clip noted.
IMPRESSION: No acute or focal abnormality is identified. Please see discussion above.

## 2009-12-08 IMAGING — CR DG FOOT COMPLETE 3+V*L*
1 series · 3 of 3 positions shown · non-contrast
Comparison: none

REASON FOR EXAM: mid-foot pain
COMMENTS:

PROCEDURE:     DXR - DXR FOOT LT COMP W/OBLIQUES  - June 24, 2008  [DATE]
RESULT:     No acute bony or joint abnormality is identified. There is no
evidence of fracture or dislocation.

[Series 1: view not recorded · 0.17mm/px · 3 of 3 slices shown]
[im 1/3]
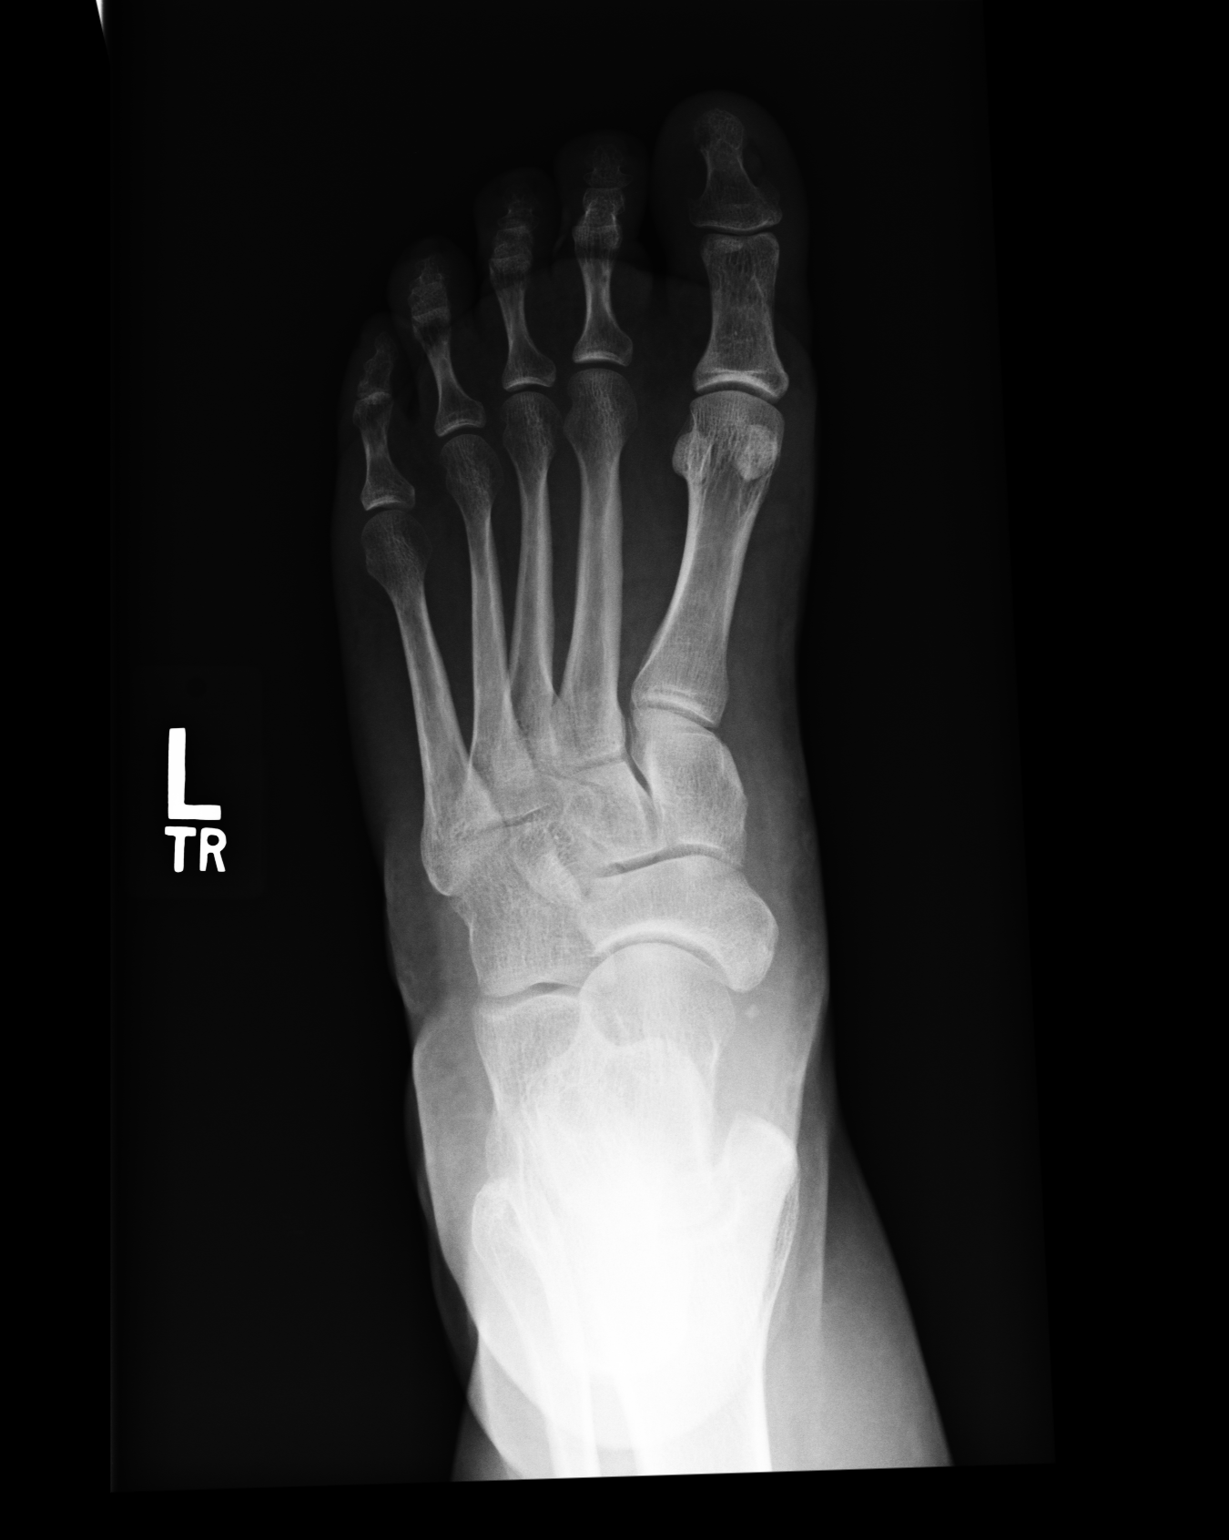
[im 2/3]
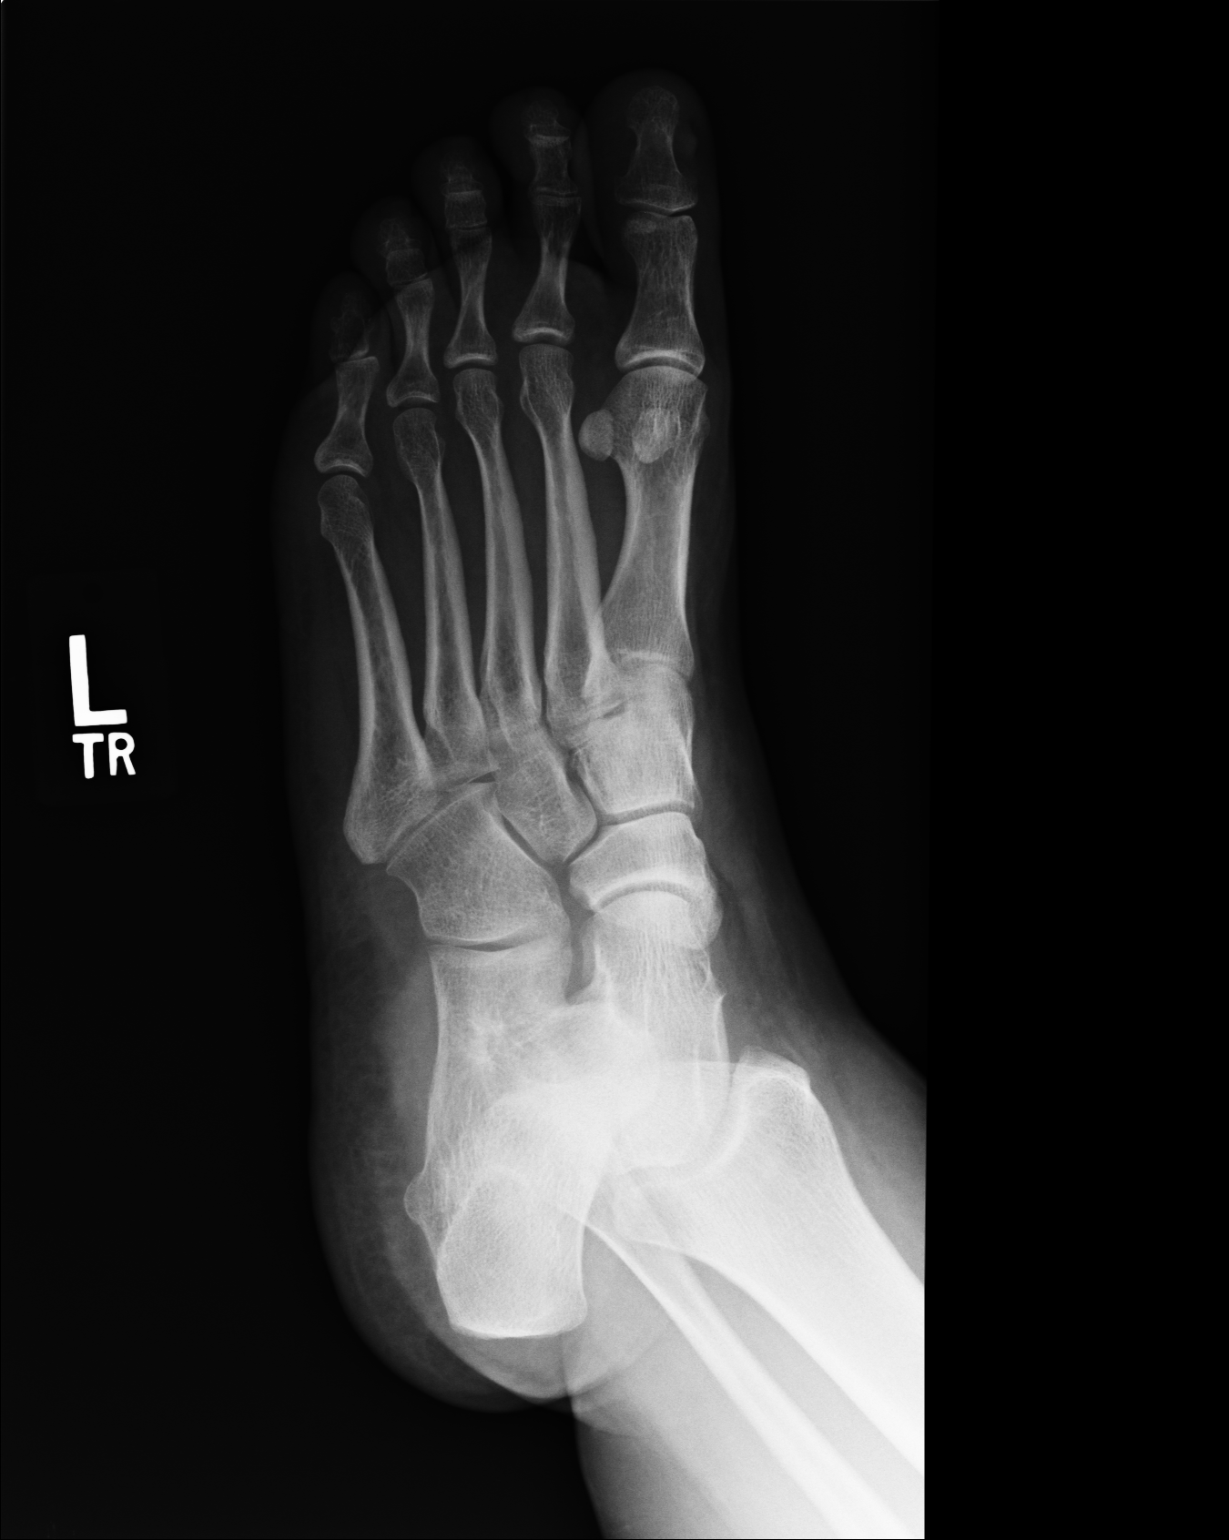
[im 3/3]
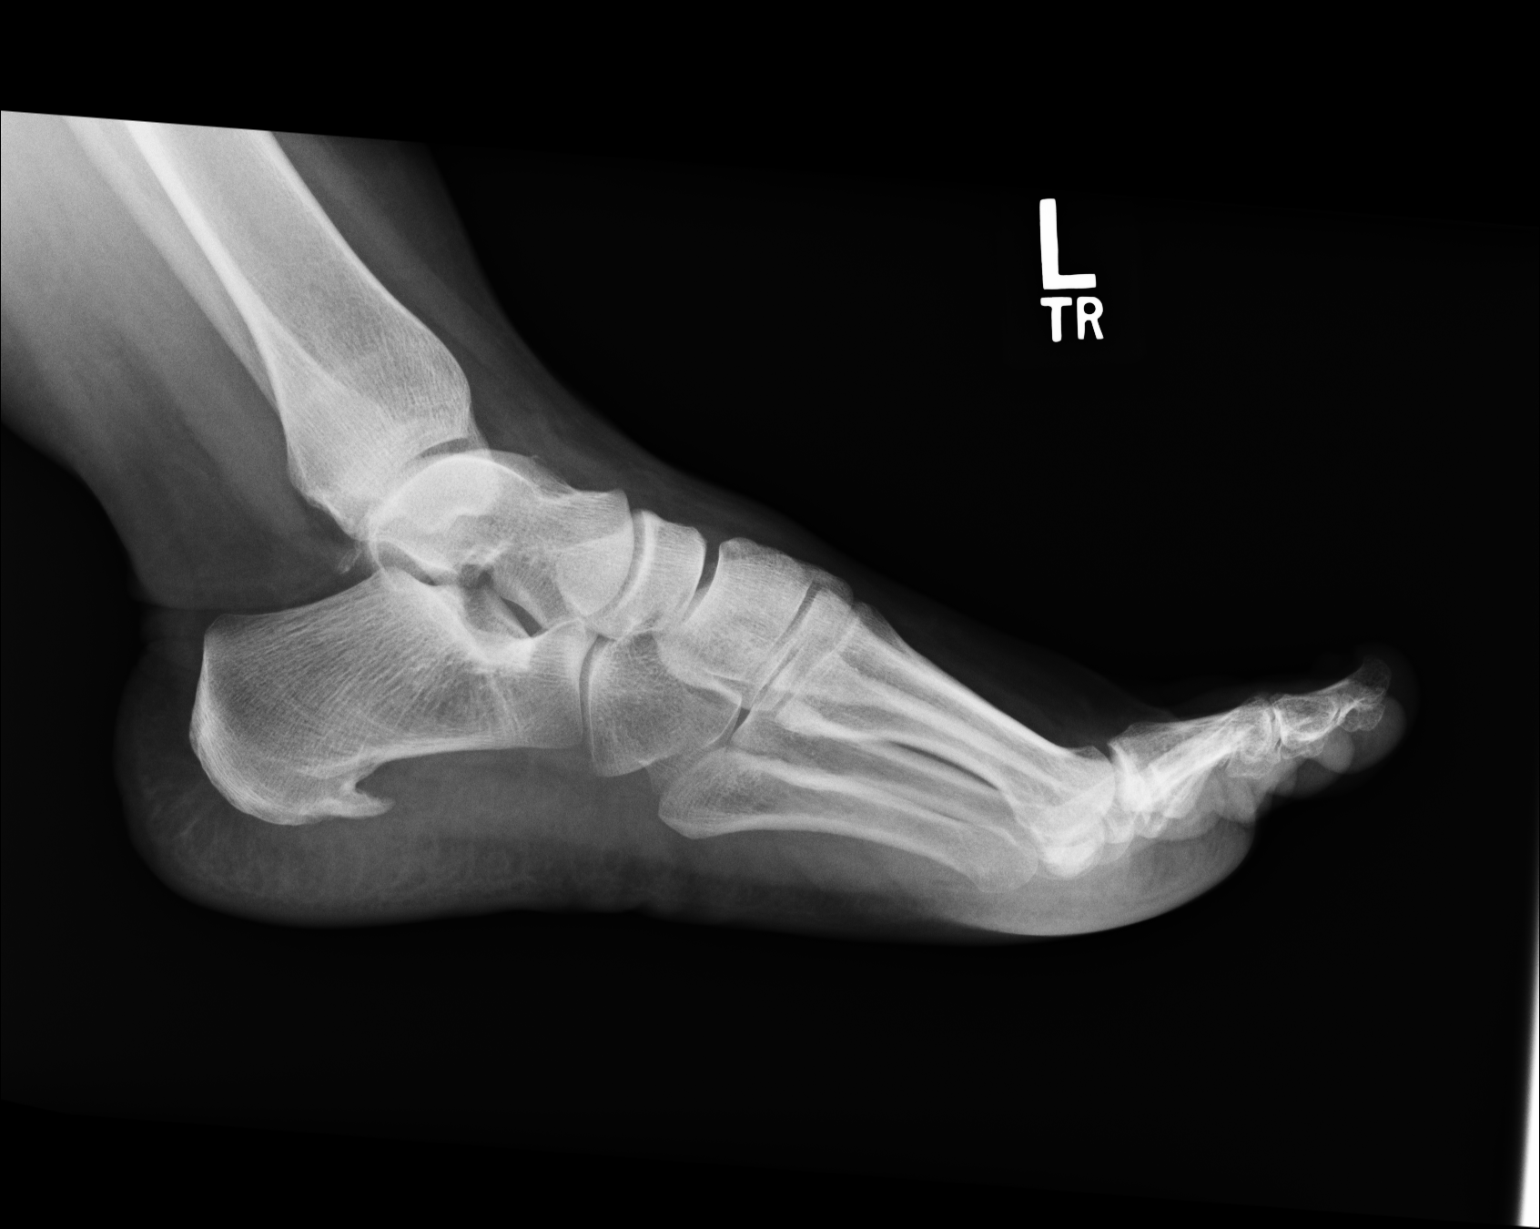

[3 of 3 positions shown; findings below may reference images not displayed]

IMPRESSION: 1.     No acute abnormality.
2.     Soft tissue swelling noted about the LEFT foot.  LEFT foot is stable
from 04/16/08.

## 2010-01-29 ENCOUNTER — Emergency Department: Payer: Self-pay | Admitting: Emergency Medicine

## 2010-01-31 ENCOUNTER — Emergency Department: Payer: Self-pay | Admitting: Emergency Medicine

## 2010-03-22 ENCOUNTER — Ambulatory Visit: Payer: Self-pay

## 2010-03-26 IMAGING — CR DG KNEE 1-2V*R*
1 series · 2 of 2 positions shown · non-contrast
Comparison: none

REASON FOR EXAM: Pain
COMMENTS:

PROCEDURE:     DXR - DXR KNEE RIGHT AP AND LATERAL  - October 10, 2008 [DATE]
RESULT:     Images of the right knee show no definite fracture, dislocation
or radiopaque foreign body. Degenerative changes are present.

[Series 1: view not recorded · 0.17mm/px · 2 of 2 slices shown]
[im 1/2]
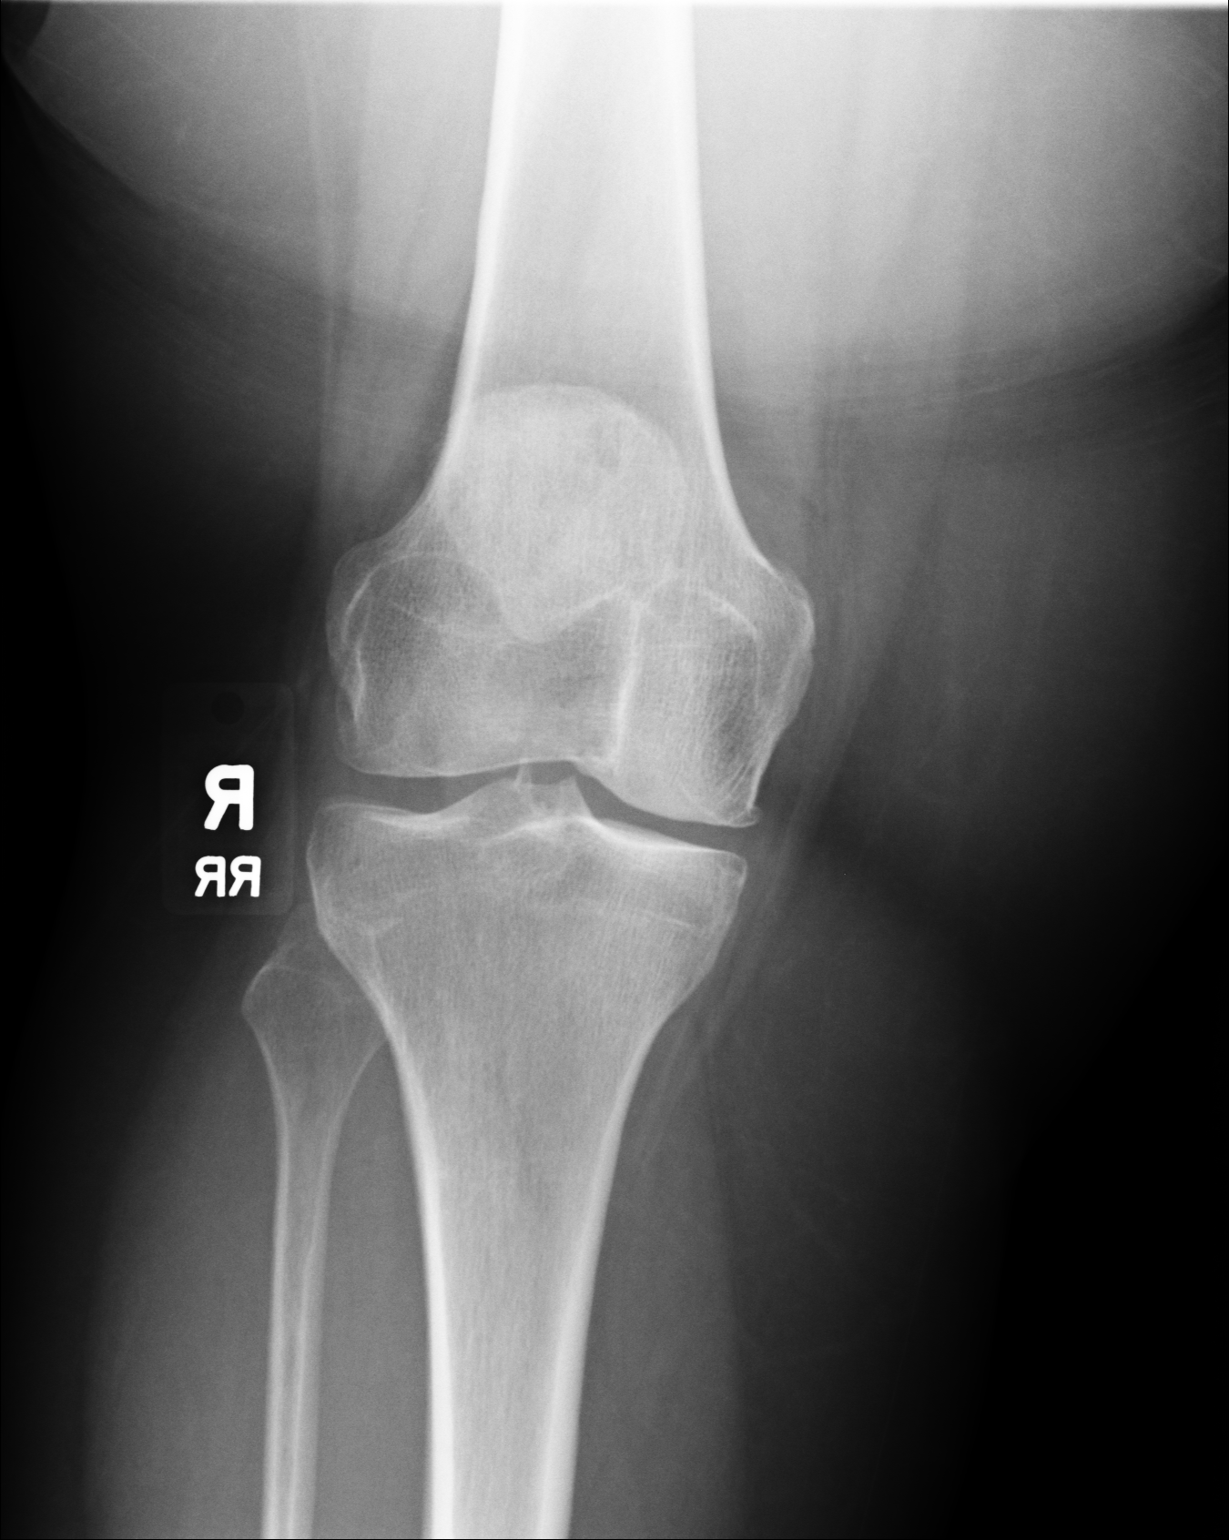
[im 2/2]
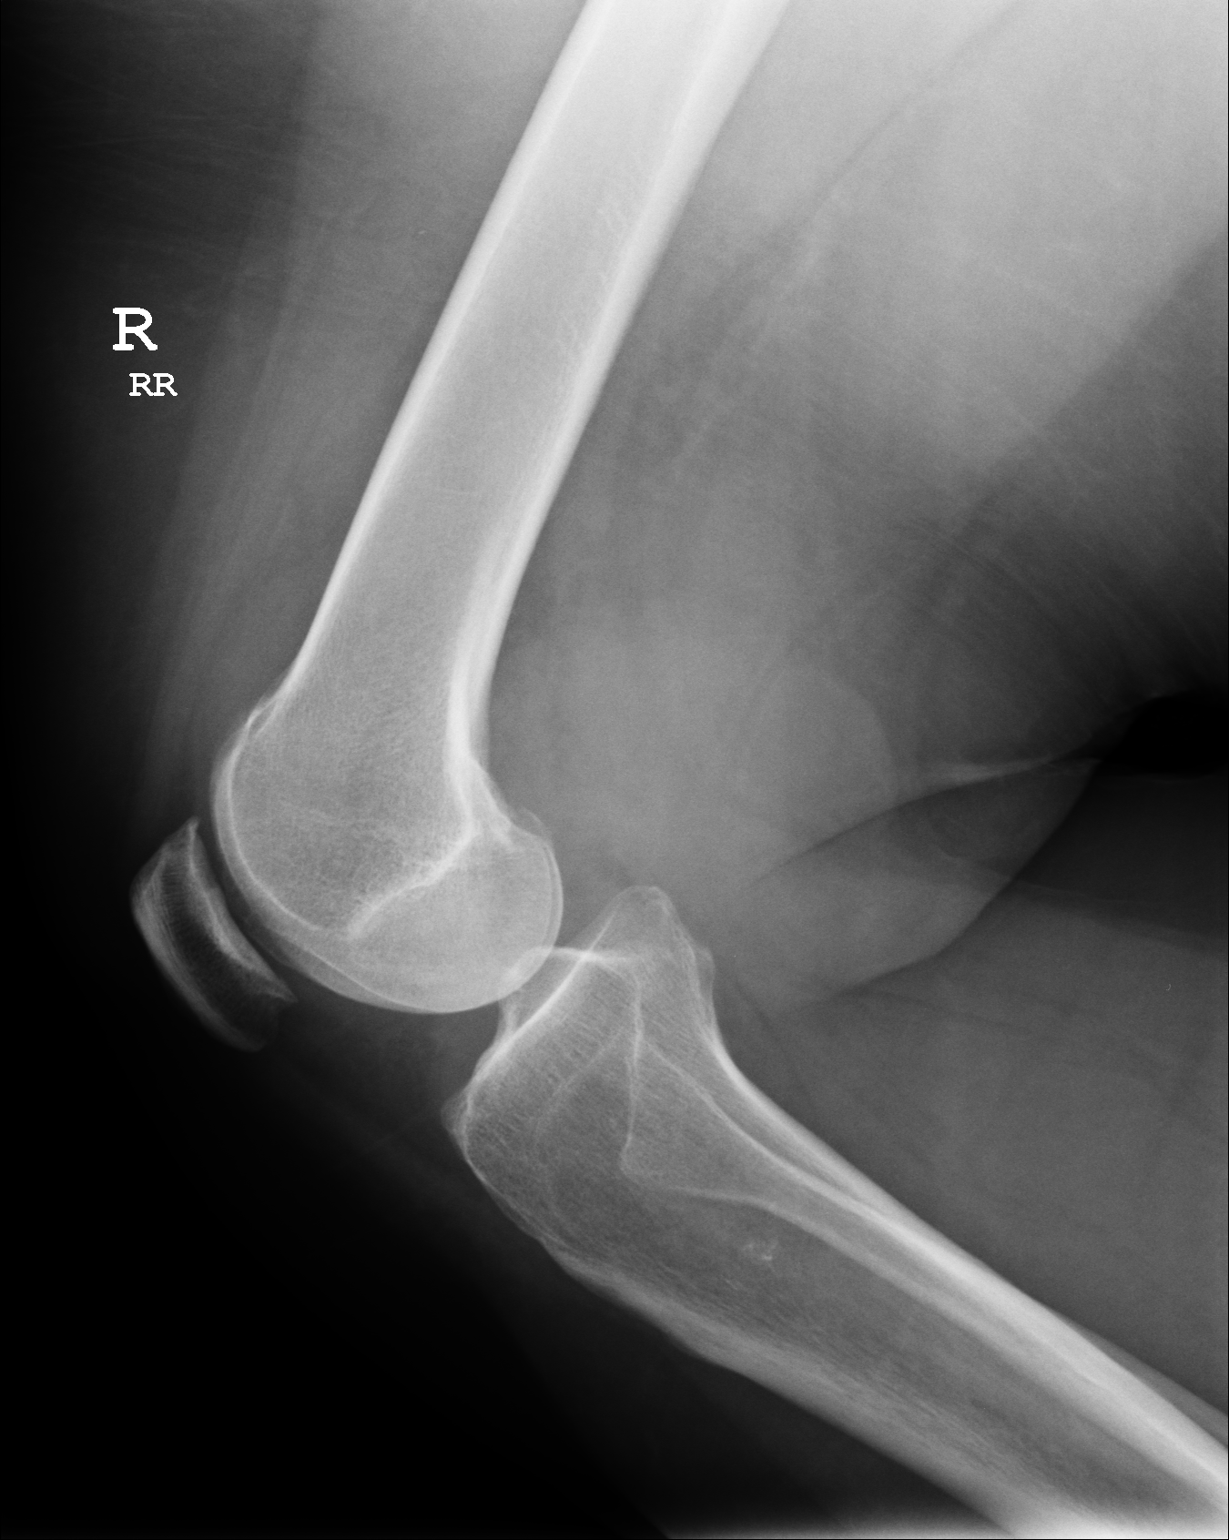

[2 of 2 positions shown; findings below may reference images not displayed]

IMPRESSION: Please see above. The appearance is stable compared to
07/04/2007.

## 2010-04-06 IMAGING — US ULTRASOUND LEFT BREAST
1 series · 15 of 15 positions shown · non-contrast
Comparison: none

REASON FOR EXAM: left breast nodular density
COMMENTS:

[Series 1: ultrasound left breast · 15 of 15 slices shown]
[im 1/15]
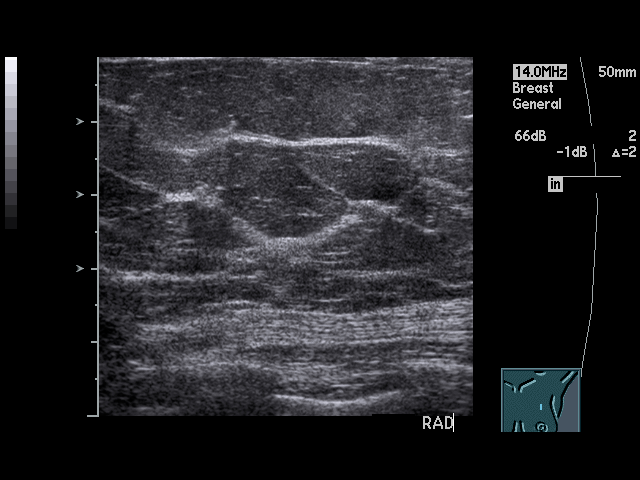
[im 2/15]
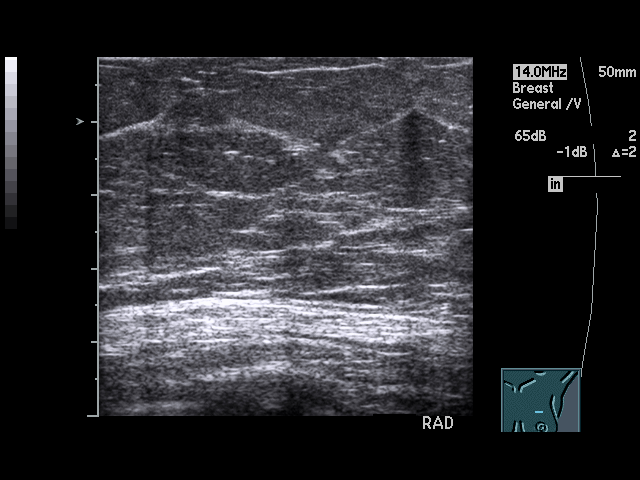
[im 3/15]
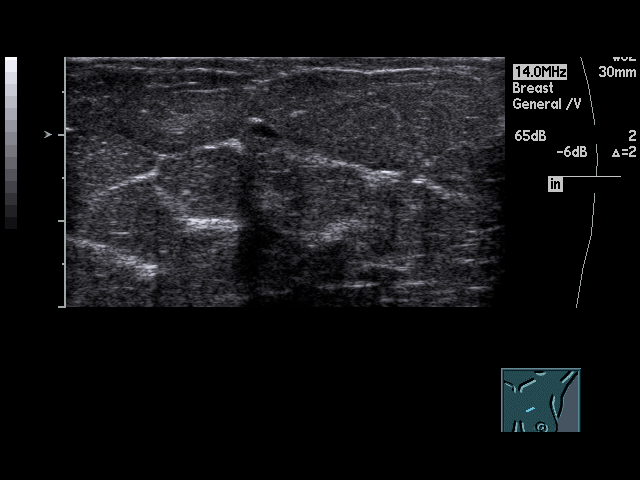
[im 4/15]
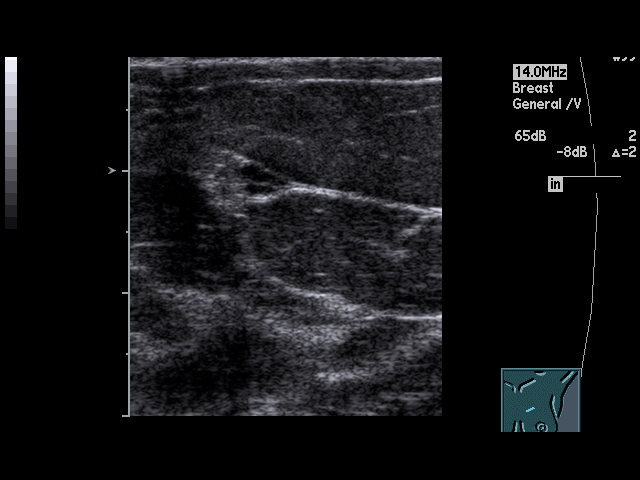
[im 5/15]
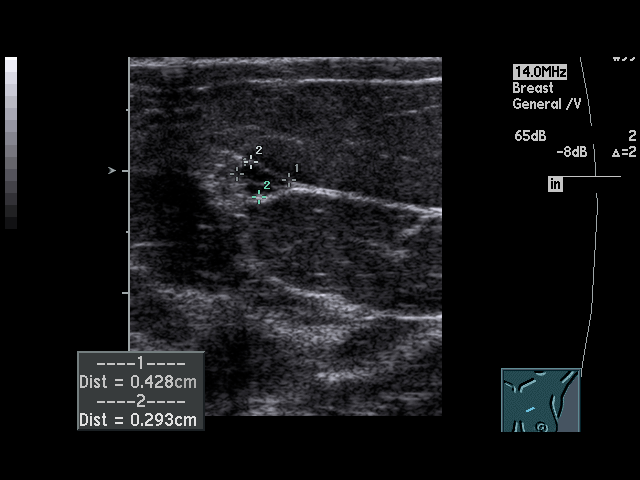
[im 6/15]
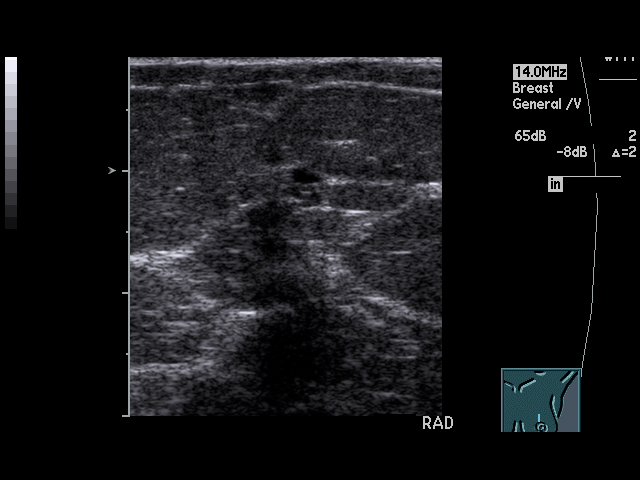
[im 7/15]
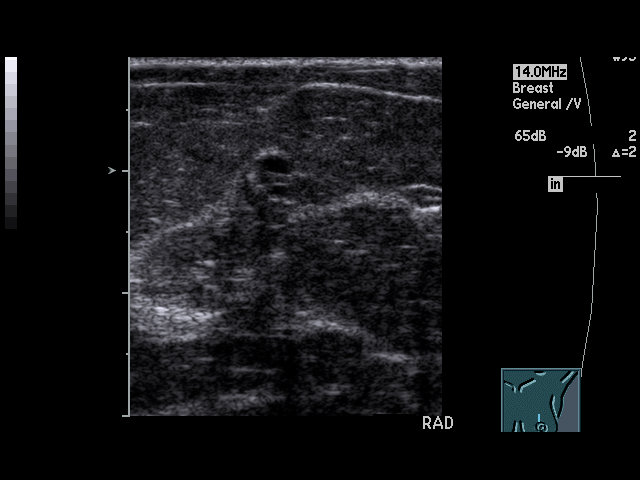
[im 8/15]
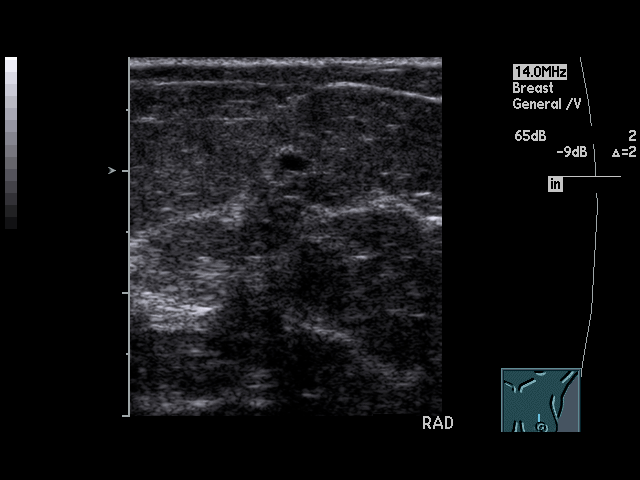
[im 9/15]
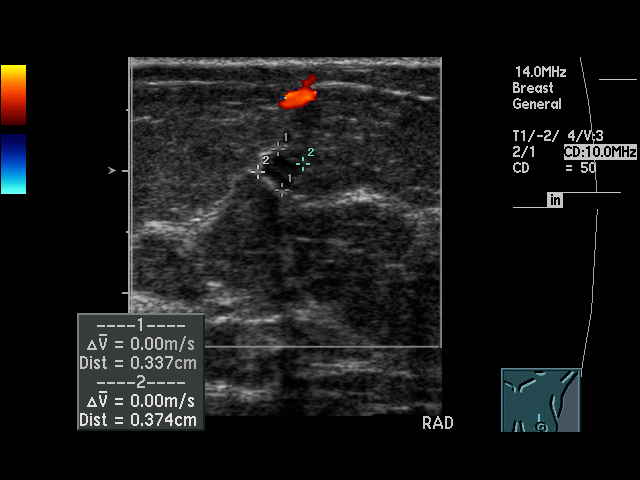
[im 10/15]
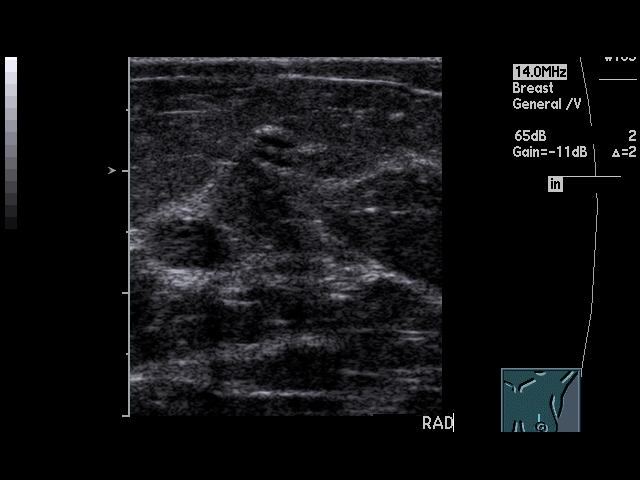
[im 11/15]
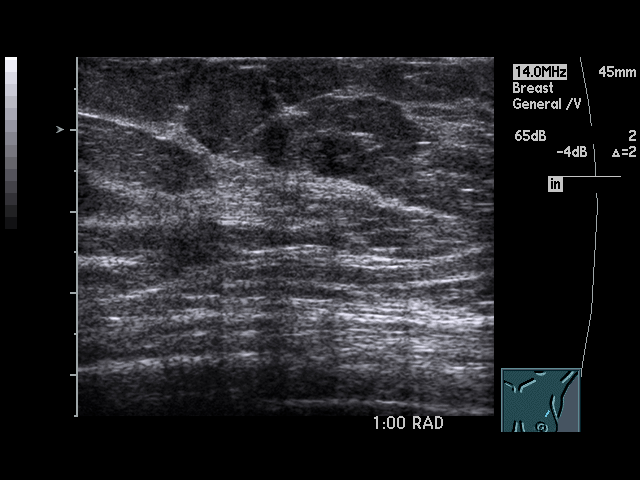
[im 12/15]
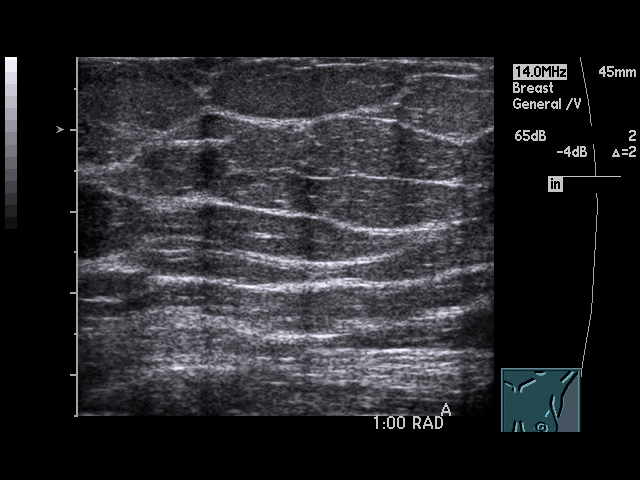
[im 13/15]
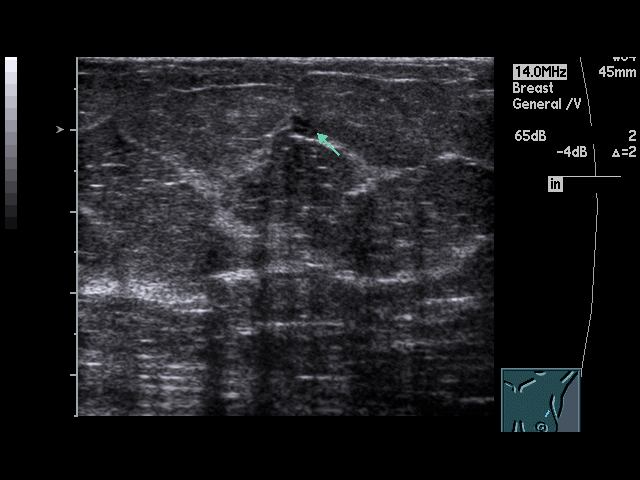
[im 14/15]
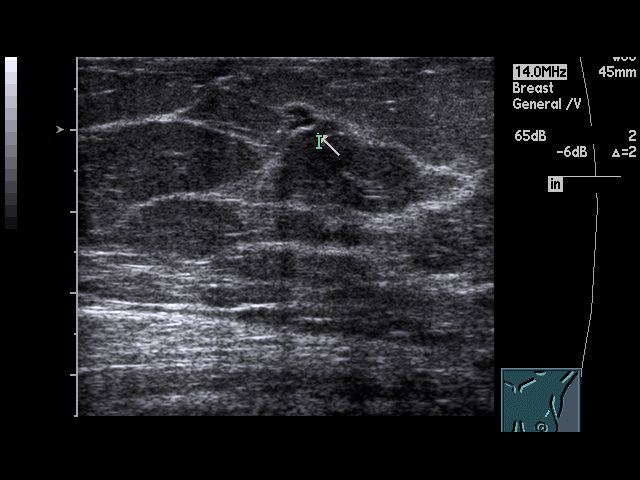
[im 15/15]
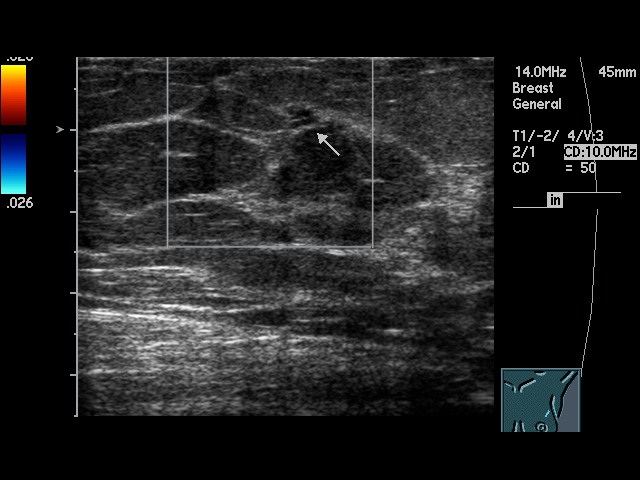

[15 of 15 positions shown; findings below may reference images not displayed]

PROCEDURE:     US  - US LT BREAST ([REDACTED])  - October 21, 2008 [DATE]

RESULT:     The superior aspect of the anterior portion of the LEFT breast
was evaluated.  This is approximately the 11 o'clock to 1 o'clock positions.
At the 12 o'clock position there is an anechoic structure measuring
approximately 3 x 4 mm most compatible with a cyst.  A very tiny amount of
through transmission can be seen on several images. At the 11 o'clock
position there is an approximately 3 x 4 mm diameter anechoic structure as
well. These are closely applied to one another and likely are responsible
for the mammographic findings.
IMPRESSION: 1. There are two tiny anechoic structures present at the approximately [DATE]
position of the LEFT breast which likely corresponds to the mammographic
findings. Please see the dictation for the supplemental mammographic views
of this same day for final recommendations and BI-RAD classification.

## 2010-04-19 ENCOUNTER — Emergency Department: Payer: Self-pay | Admitting: Unknown Physician Specialty

## 2010-04-22 ENCOUNTER — Emergency Department: Payer: Self-pay | Admitting: Internal Medicine

## 2010-05-03 IMAGING — CR DG KNEE COMPLETE 4+V*L*
1 series · 4 of 4 positions shown · non-contrast
Comparison: none

REASON FOR EXAM: injury pain swelling
COMMENTS:   LMP: not preg

PROCEDURE:     DXR - DXR KNEE LT COMP WITH OBLIQUES  - November 17, 2008  [DATE]
RESULT:     Comparison: No comparison

[Series 1: view not recorded · 0.17mm/px · 4 of 4 slices shown]
[im 1/4]
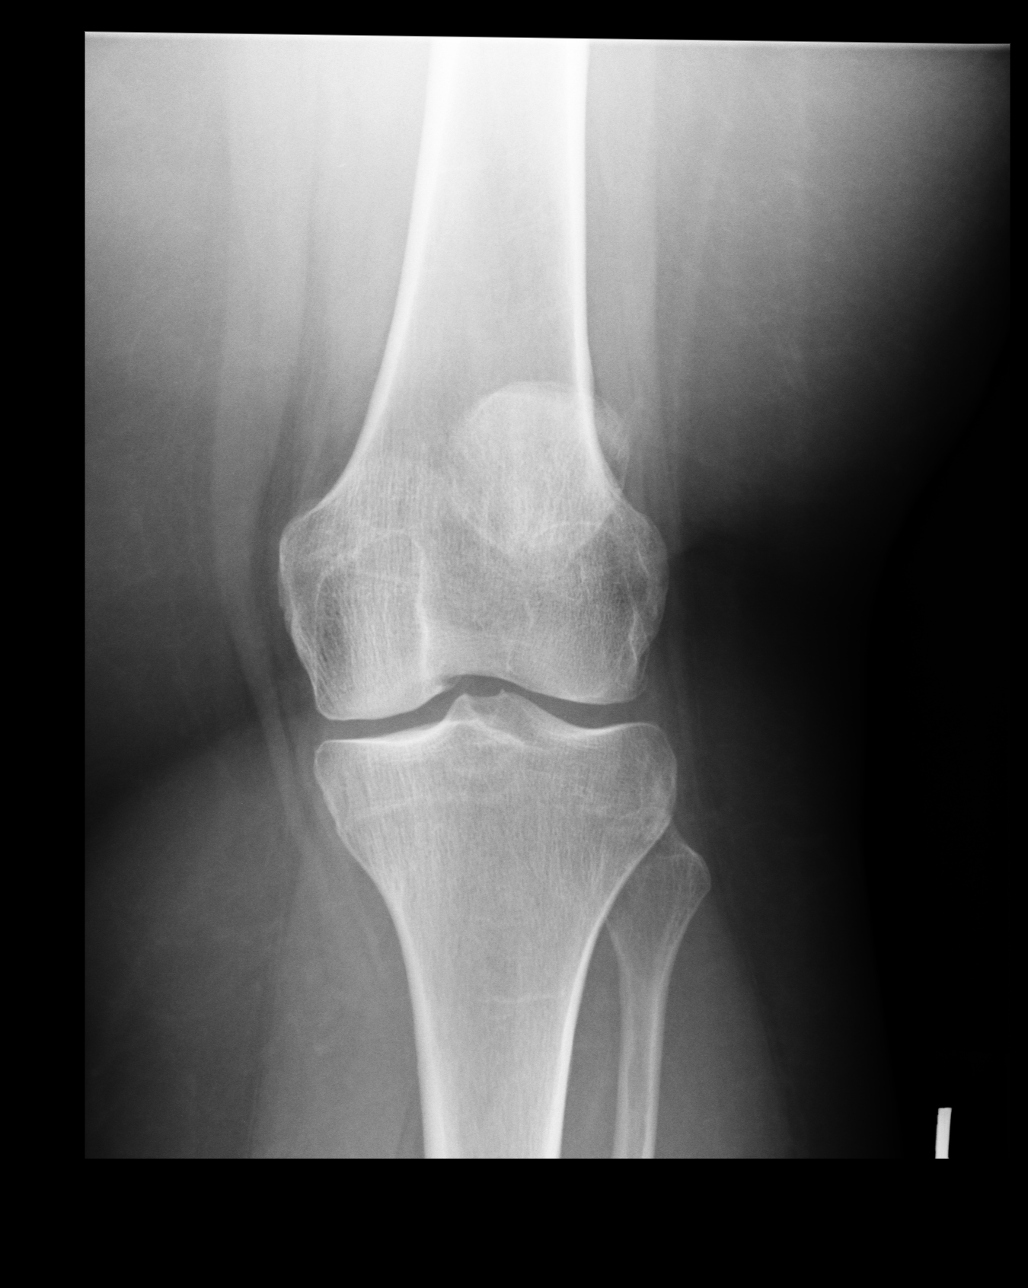
[im 2/4]
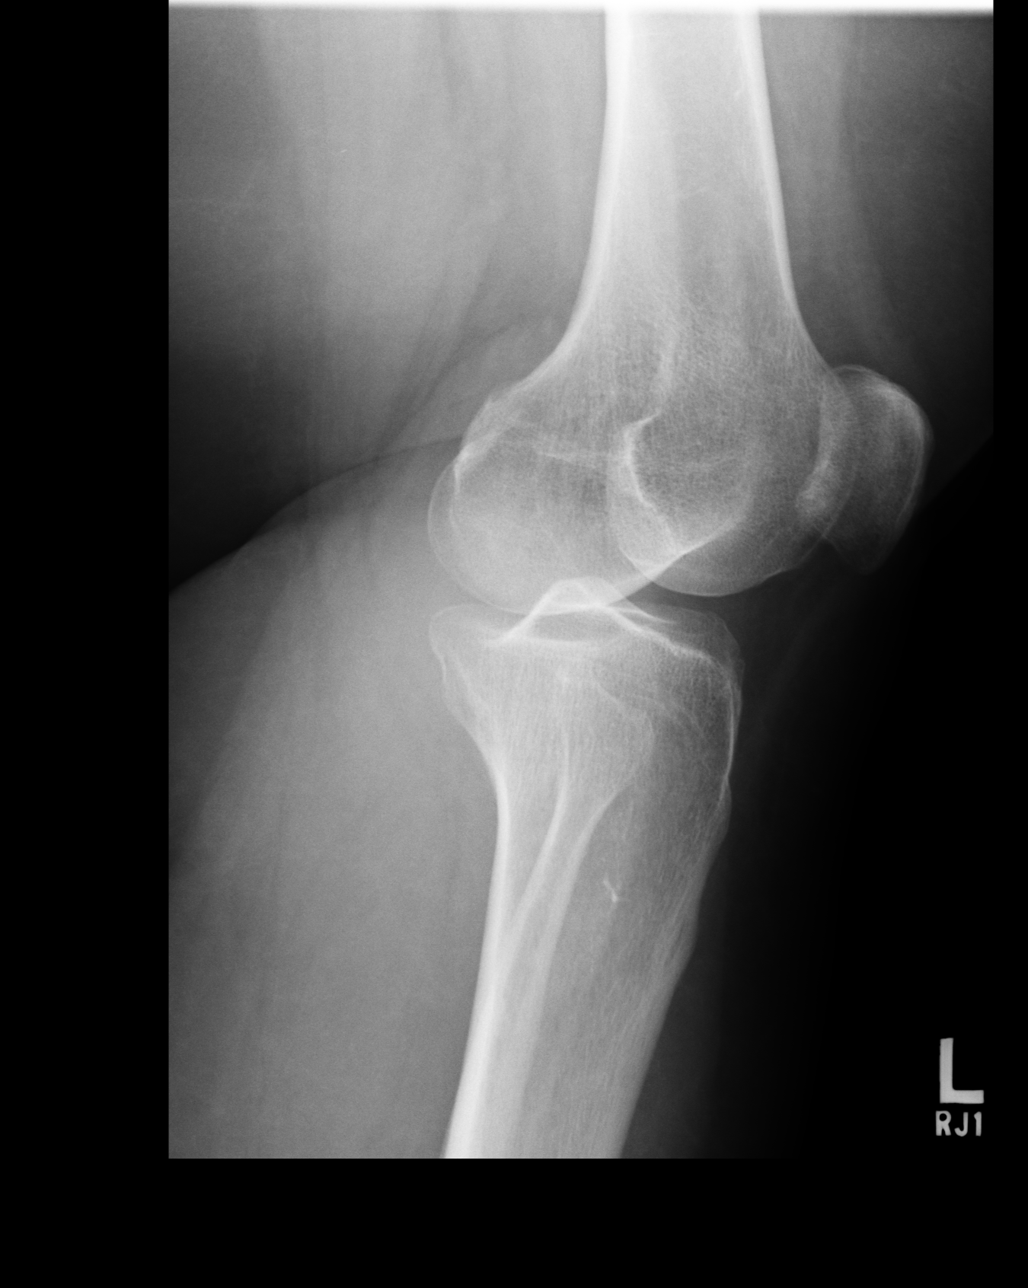
[im 3/4]
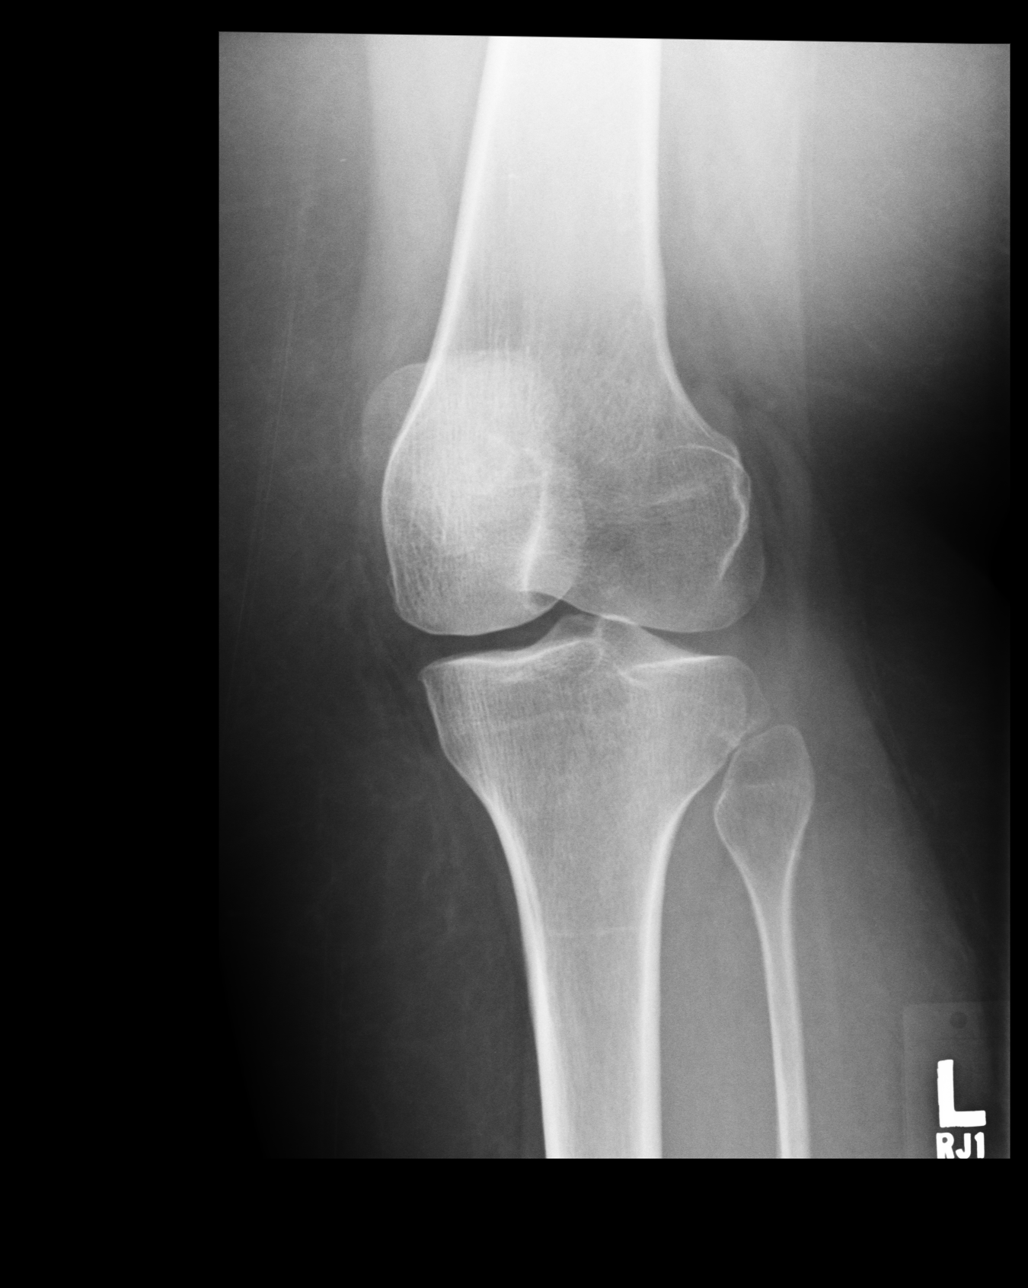
[im 4/4]
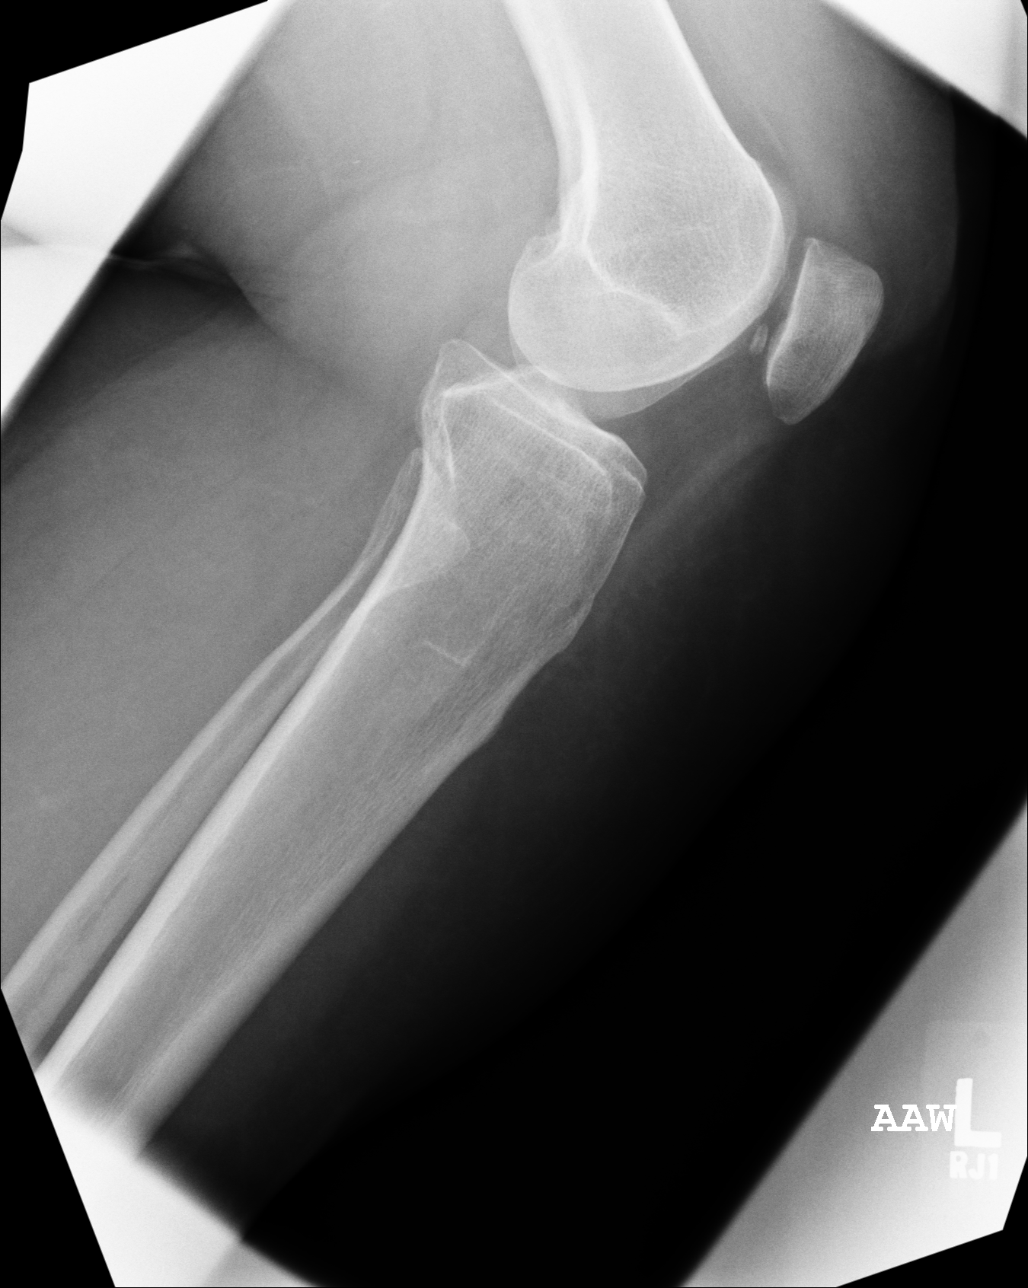

[4 of 4 positions shown; findings below may reference images not displayed]

FINDINGS: 4 views of the left knee demonstrates no acute fracture or dislocation.
There is no significant joint effusion.

There is a well corticated ossific density projecting in the inferior
patellofemoral compartment most concerning for a loose body.
IMPRESSION: No acute osseous injury of the left knee.

There is a well corticated ossific density projecting in the inferior
patellofemoral compartment most concerning for a loose body.

## 2010-05-03 IMAGING — CR SACRUM AND COCCYX - 2+ VIEW
1 series · 4 of 4 positions shown · non-contrast
Comparison: No comparison

REASON FOR EXAM: fall ,injury , pain
COMMENTS:   LMP: not preg

PROCEDURE:     DXR - DXR SACRUM AND COCCYX  - November 17, 2008  [DATE]
RESULT:     History: Fall

[Series 1: view not recorded · 0.17mm/px · 4 of 4 slices shown]
[im 1/4]
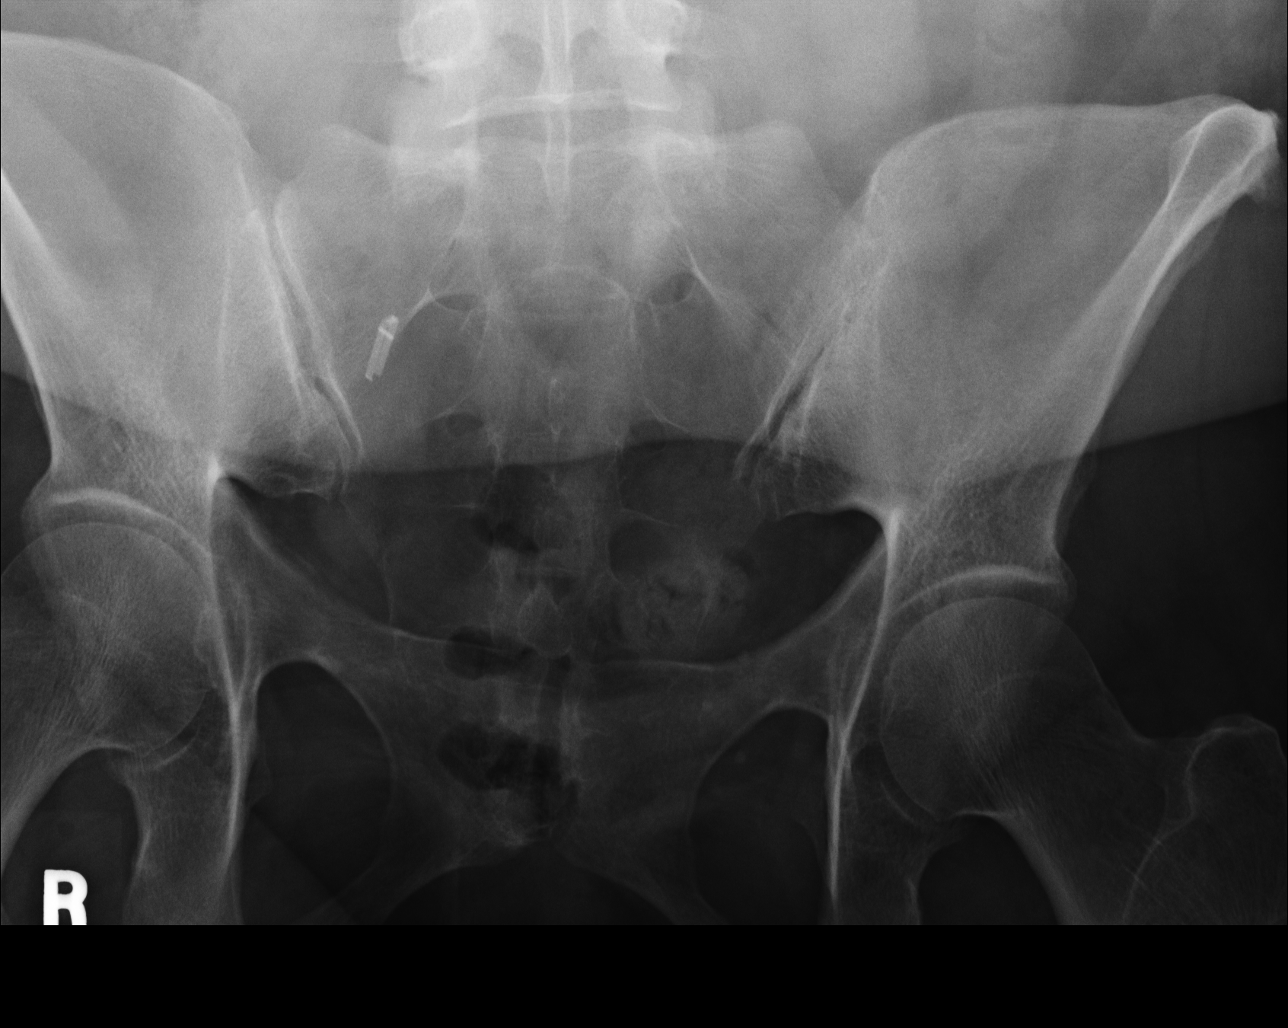
[im 2/4]
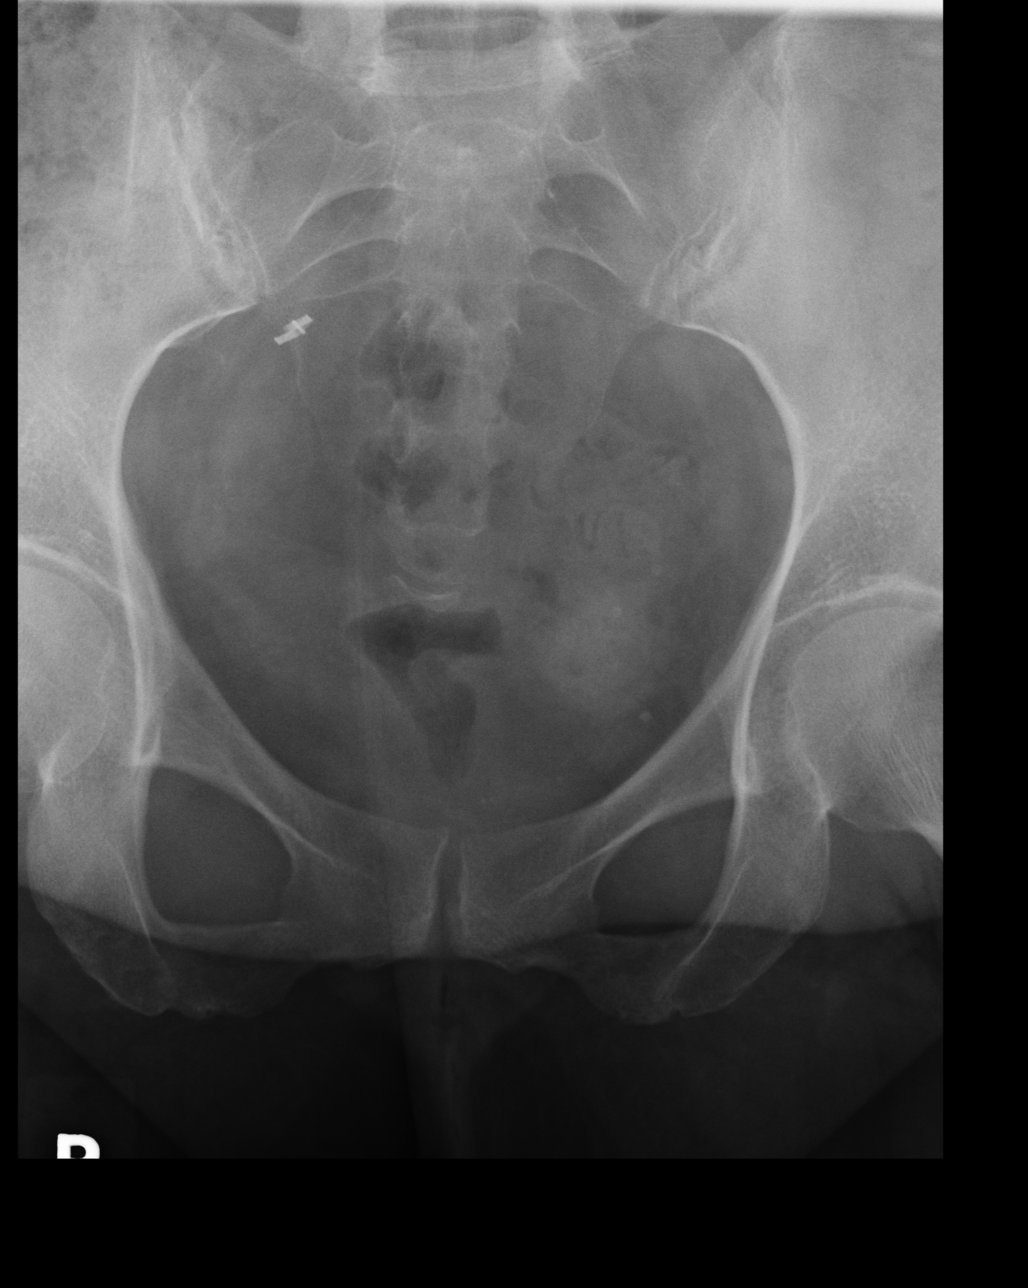
[im 3/4]
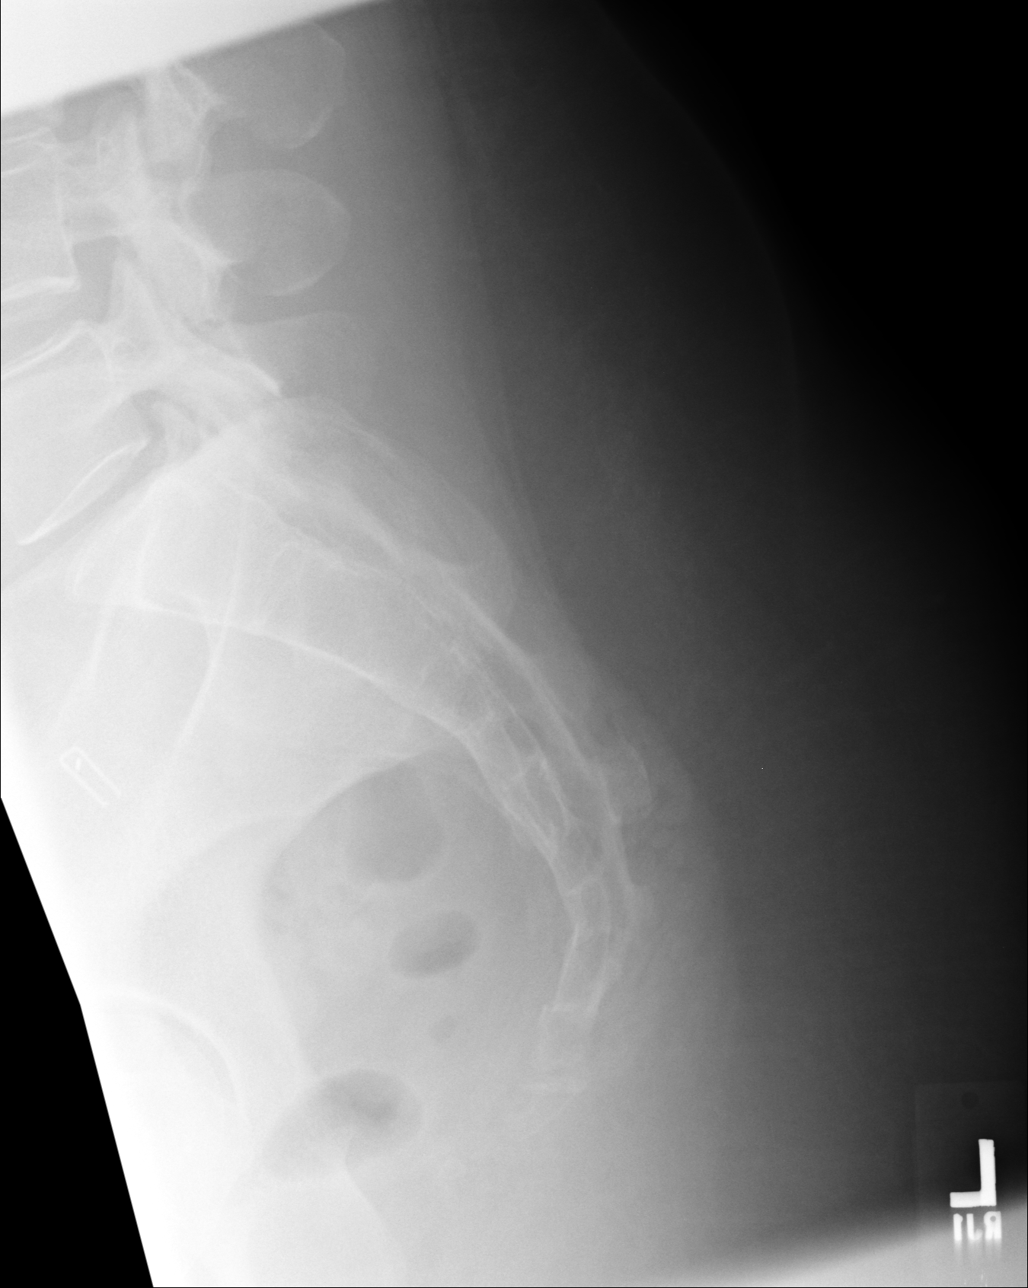
[im 4/4]
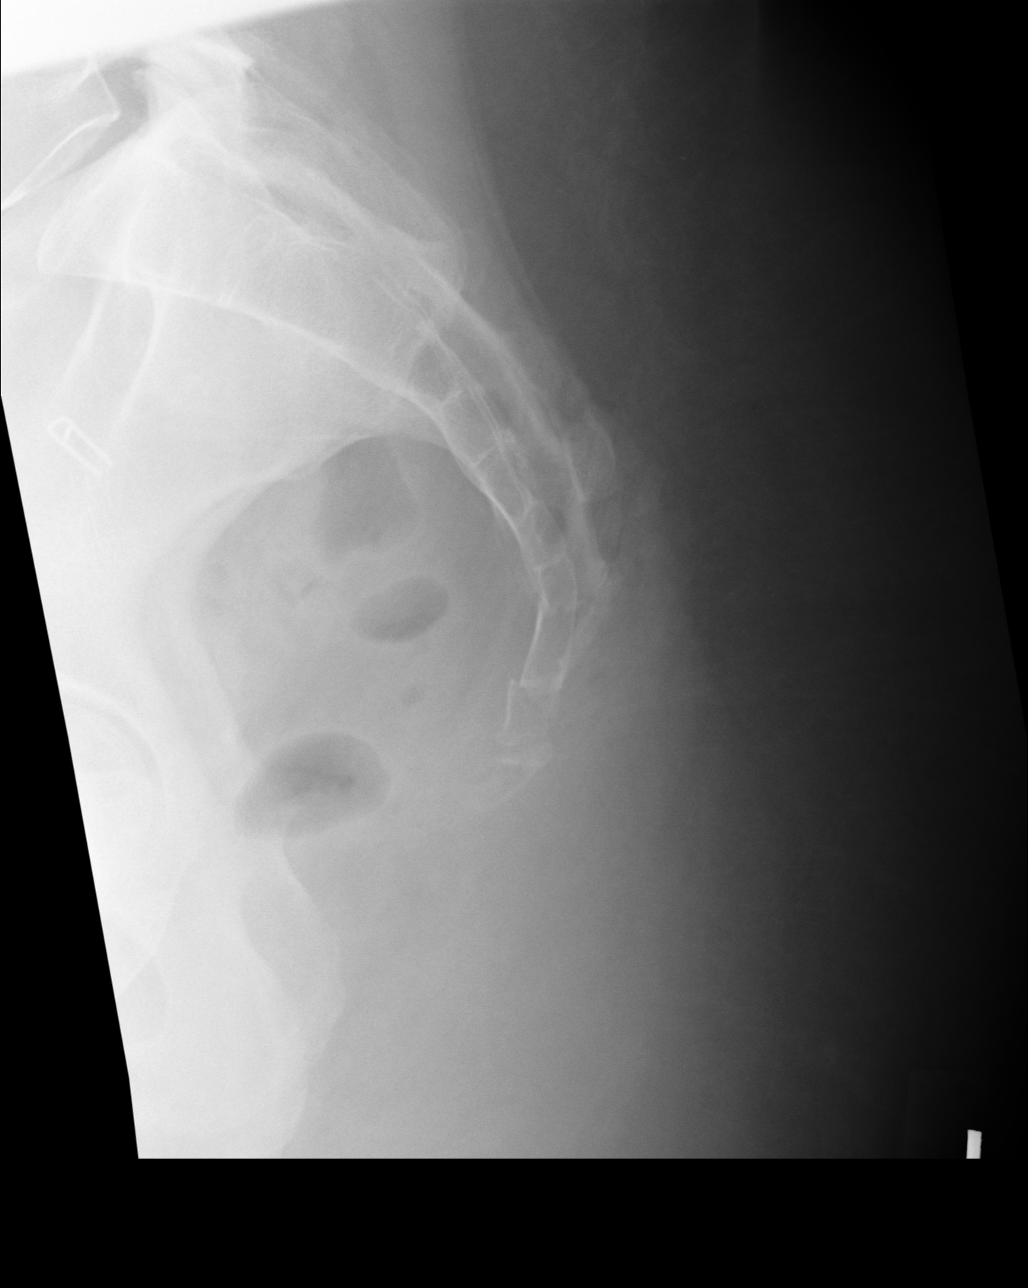

[4 of 4 positions shown; findings below may reference images not displayed]

FINDINGS: AP and lateral views of the sacrum and coccyx demonstrates a minimally
displaced fracture of the distal coccyx. No other fractures are identified.
The sacroiliac joints are normal. The visualized portions of bilateral hips
are unremarkable.
IMPRESSION: Minimally displaced fracture of the distal coccyx.

## 2011-07-15 IMAGING — CT CT HEAD WITHOUT CONTRAST
2 series · 16 of 30 positions shown, 20 images · non-contrast
Comparison: none

REASON FOR EXAM: lelft sided facial numbness
COMMENTS:

[Series 2: without · axial · non-contrast · 0.39mm/px · z∈[+893,+1013]mm · 13 of 30 slices shown, 17 images]
[im 3/30  brain]
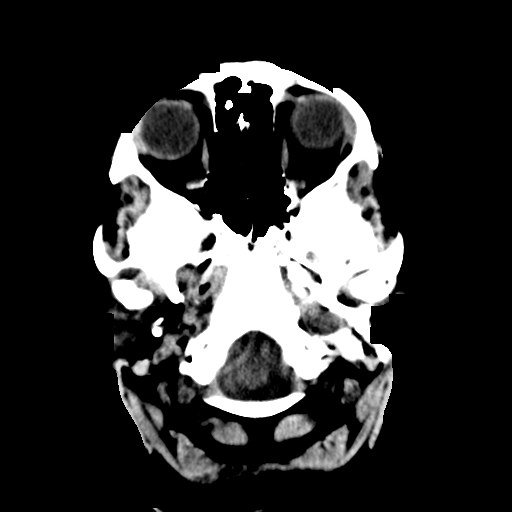
[im 3/30  bone]
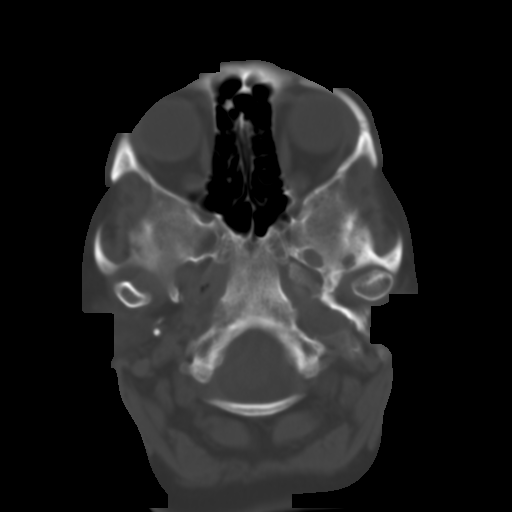
[im 5/30  brain]
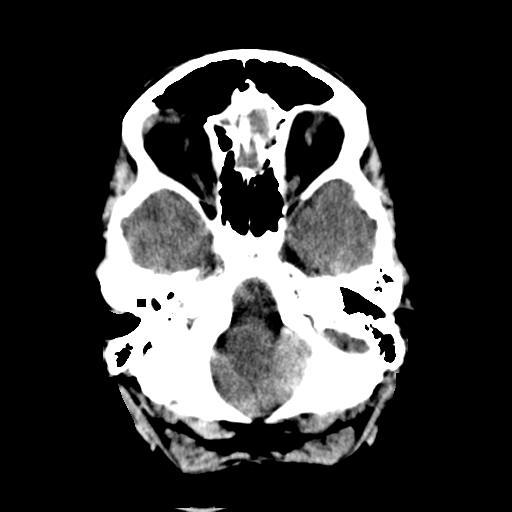
[im 7/30  brain]
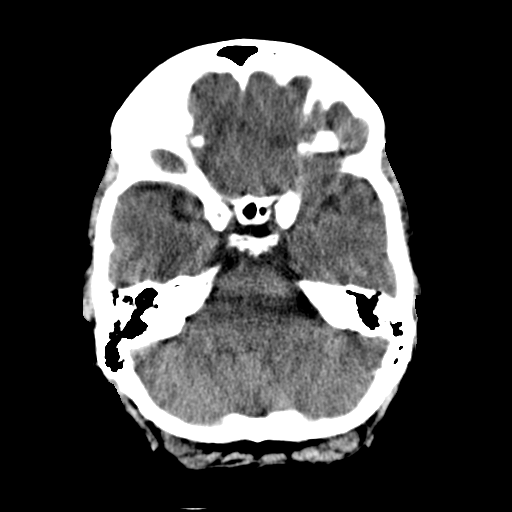
[im 9/30  brain]
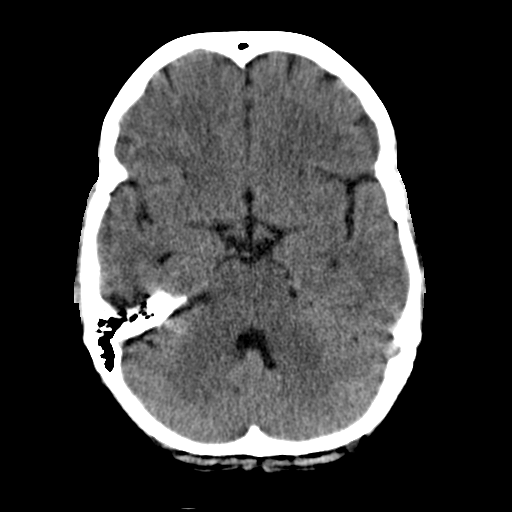
[im 11/30  brain]
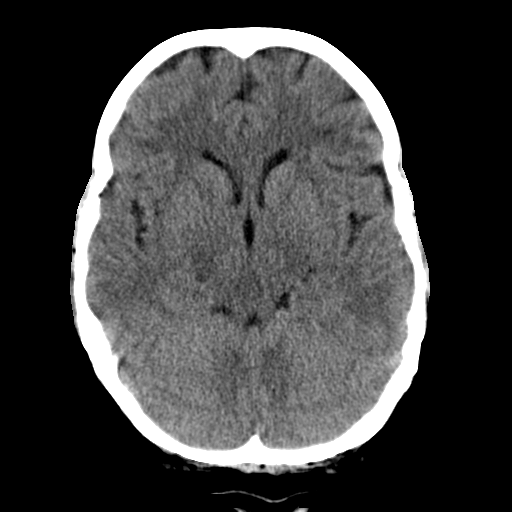
[im 11/30  bone]
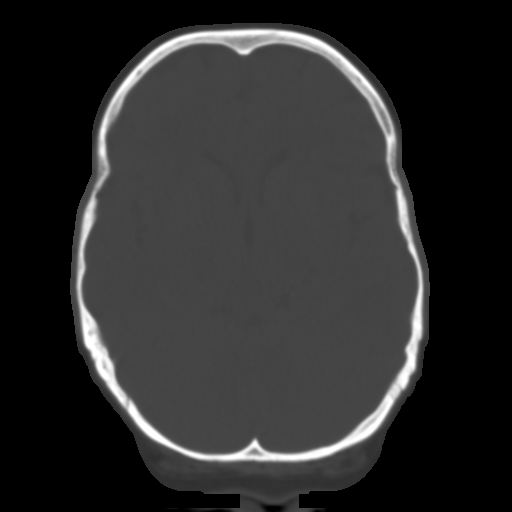
[im 13/30  brain]
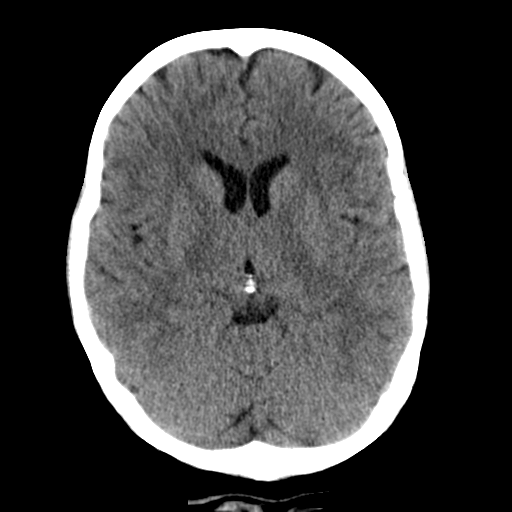
[im 15/30  brain]
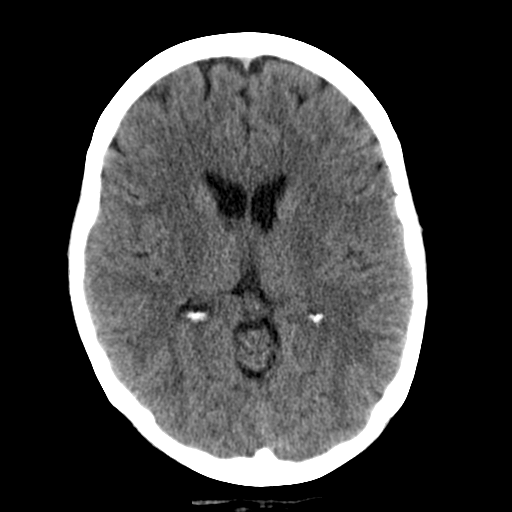
[im 17/30  brain]
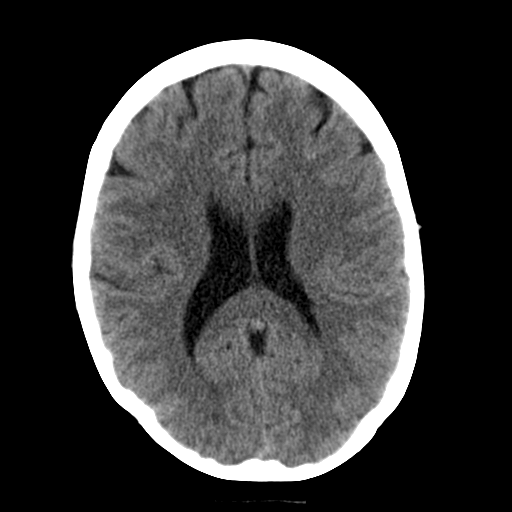
[im 19/30  brain]
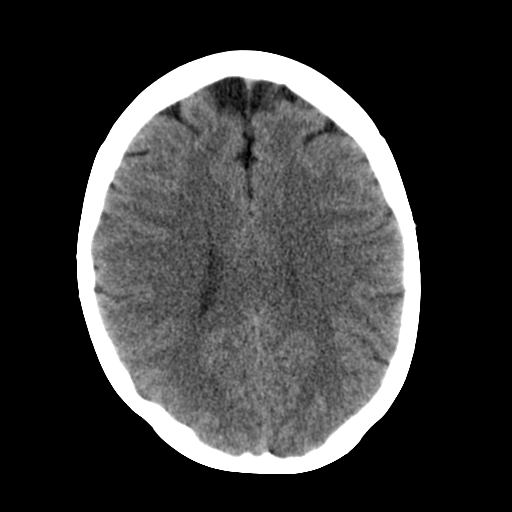
[im 19/30  bone]
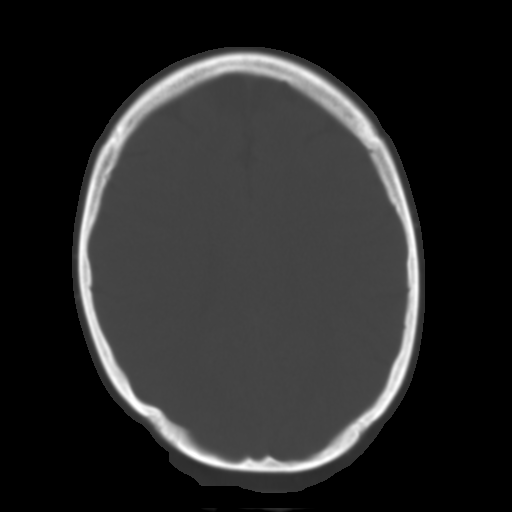
[im 21/30  brain]
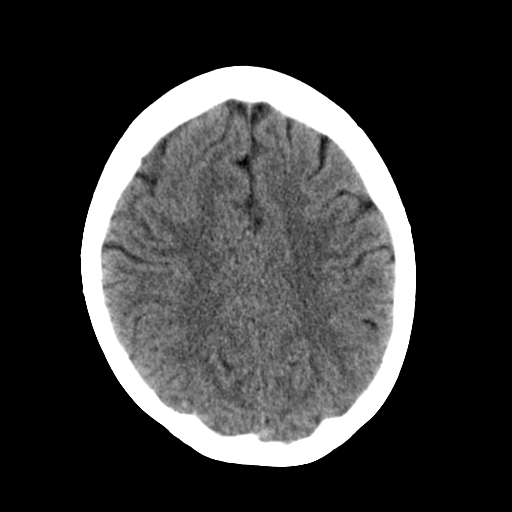
[im 23/30  brain]
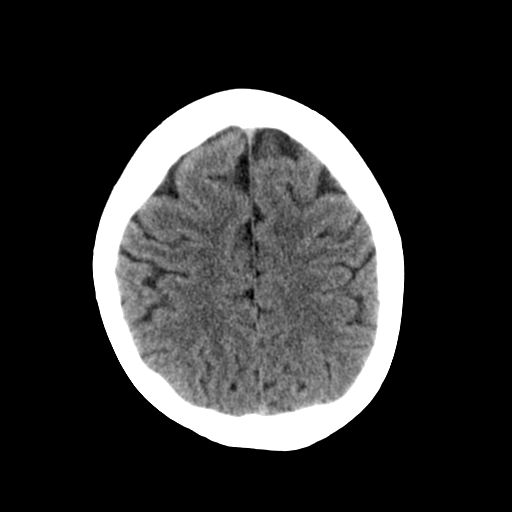
[im 25/30  brain]
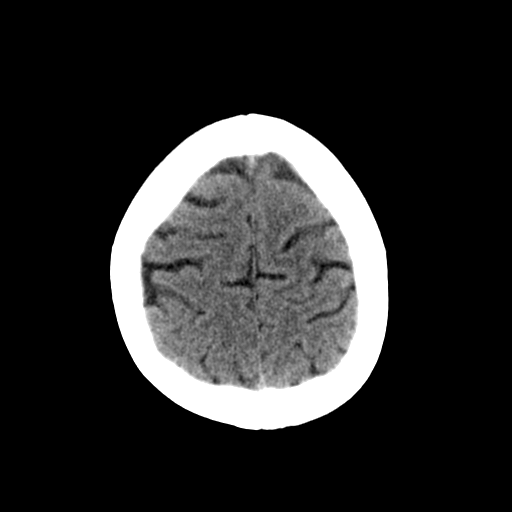
[im 27/30  brain]
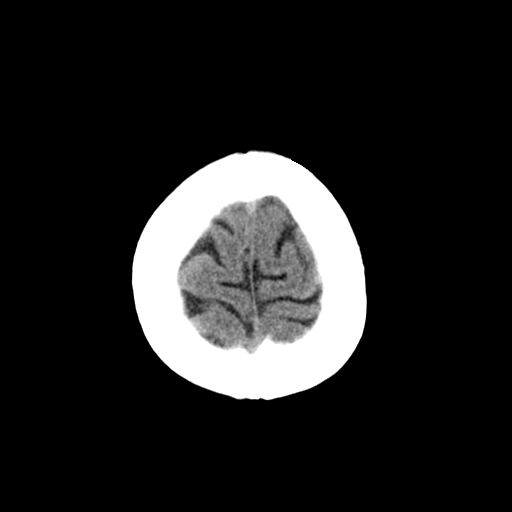
[im 27/30  bone]
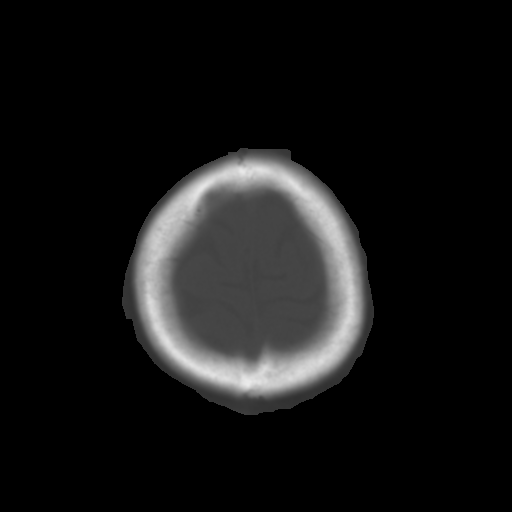

[Series 3: bone · axial · 0.39mm/px · z∈[+893,+933]mm · 3 of 30 slices shown]
[im 3/30  bone]
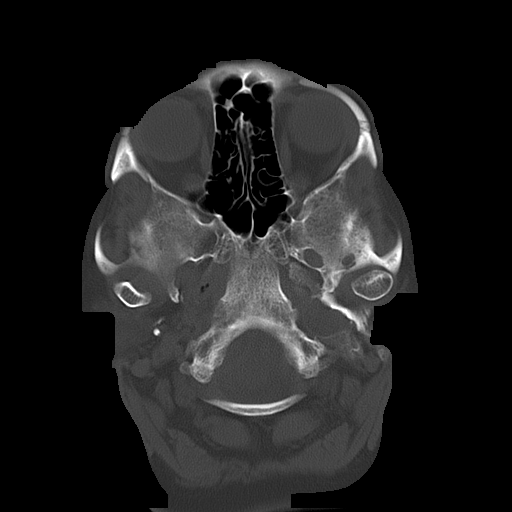
[im 7/30  bone]
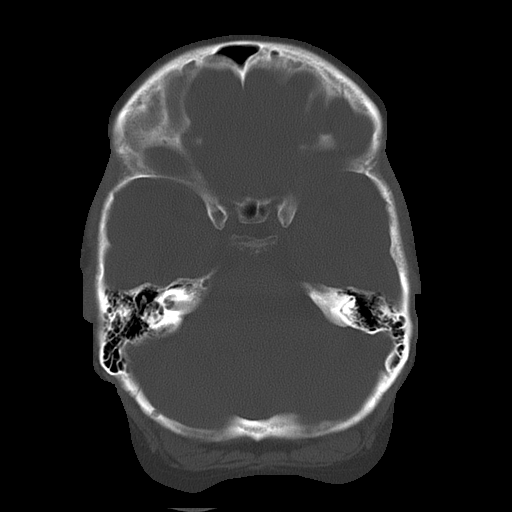
[im 11/30  bone]
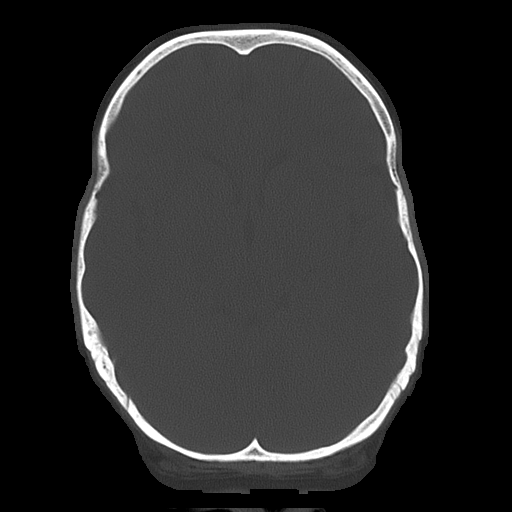

[16 of 30 positions shown; findings below may reference images not displayed]

PROCEDURE:     CT  - CT HEAD WITHOUT CONTRAST  - January 29, 2010  [DATE]

RESULT:     Axial noncontrast CT scanning was performed through the brain at
5 mm intervals and slice thicknesses.

The ventricles are normal in size and position. There is no intracranial
hemorrhage nor intracranial mass effect. The cerebellum and brainstem are
normal in density. At bone window settings the observed portions of the
paranasal sinuses are clear. There is no evidence of an acute skull
fracture. The observed portions of the paranasal sinuses and mastoid air
cells are clear.
IMPRESSION: I see no acute intracranial abnormality.

## 2011-07-17 IMAGING — CT CT HEAD WITHOUT CONTRAST
2 series · 16 of 30 positions shown, 20 images · non-contrast
Comparison: none

REASON FOR EXAM: dx with Bells Palsy on [REDACTED] / today symptoms worse
with left arm weakness/ p
COMMENTS:   May transport without cardiac monitor

[Series 2: without · axial · non-contrast · 0.43mm/px · z∈[-152,-32]mm · 13 of 28 slices shown, 17 images]
[im 2/28  brain]
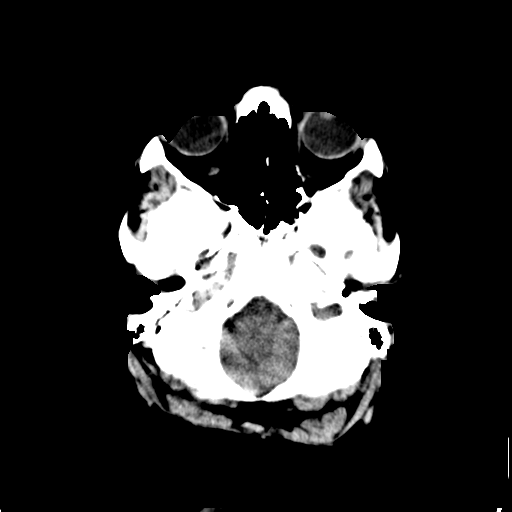
[im 2/28  bone]
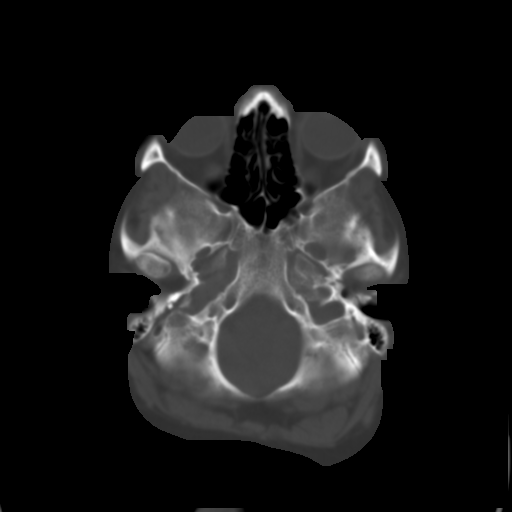
[im 4/28  brain]
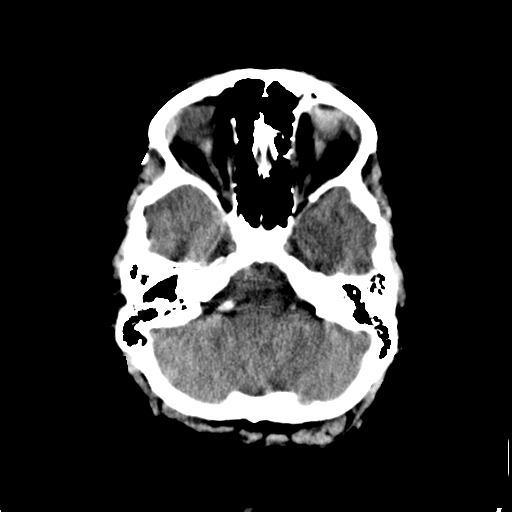
[im 6/28  brain]
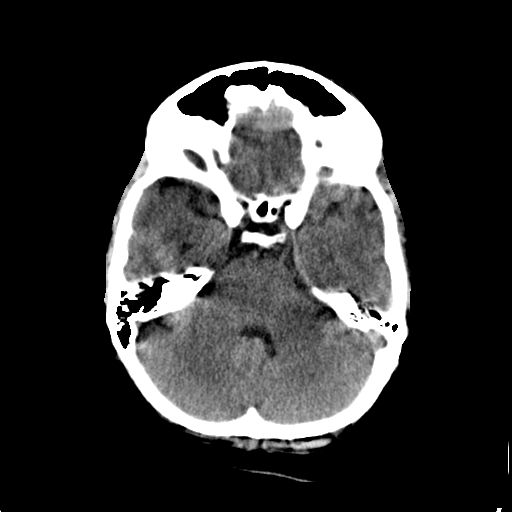
[im 8/28  brain]
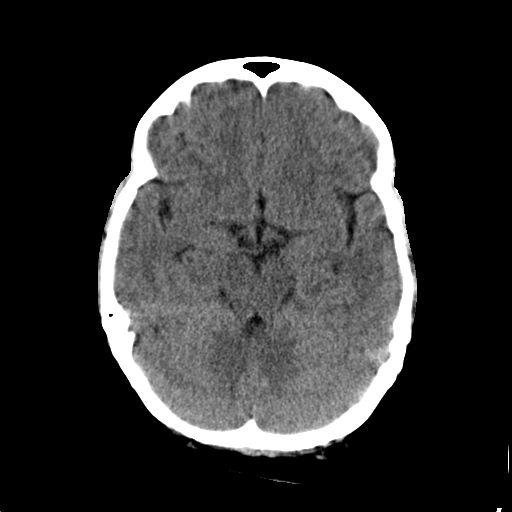
[im 10/28  brain]
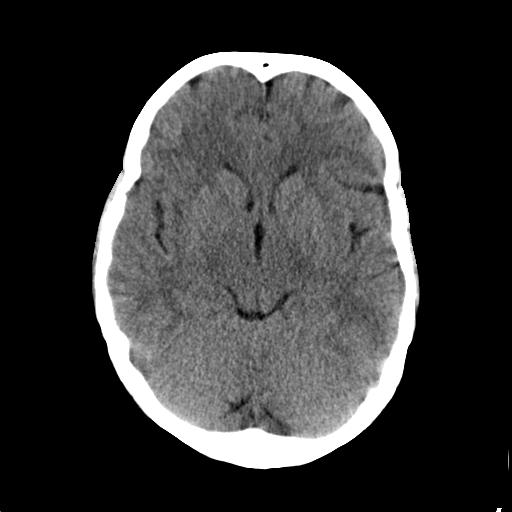
[im 10/28  bone]
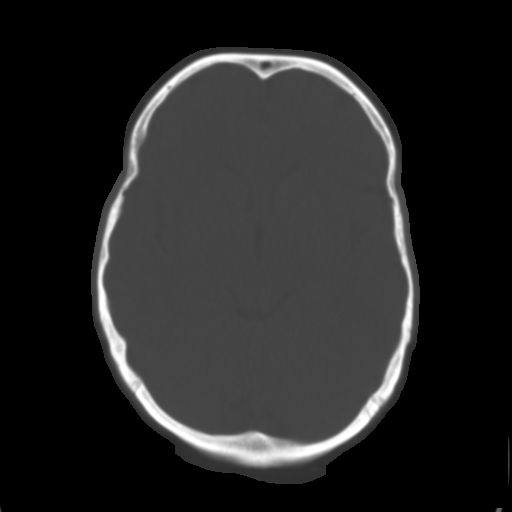
[im 12/28  brain]
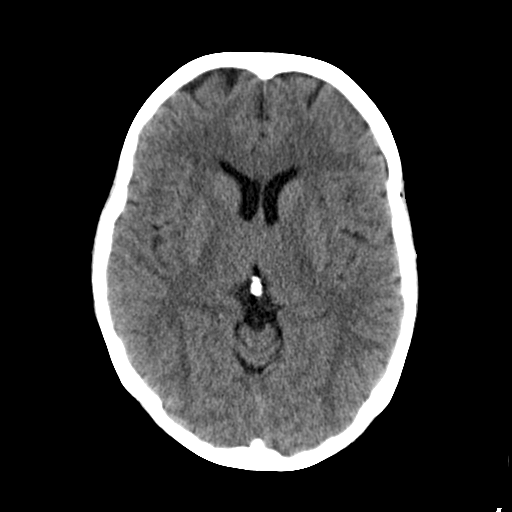
[im 14/28  brain]
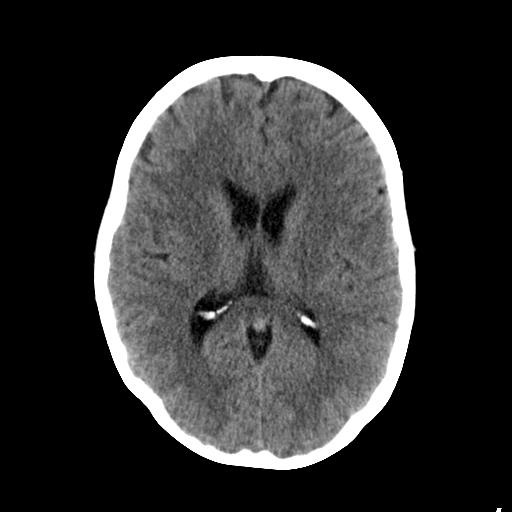
[im 16/28  brain]
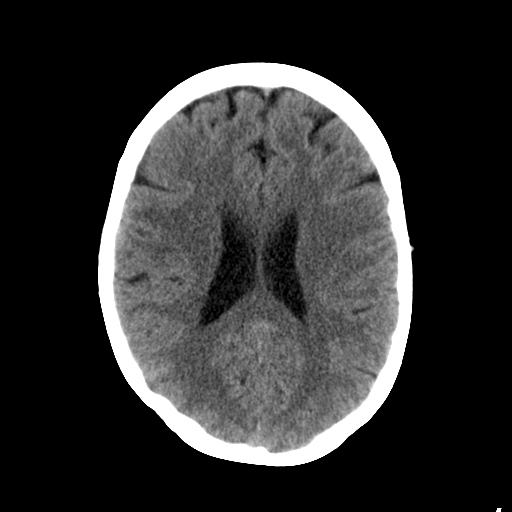
[im 18/28  brain]
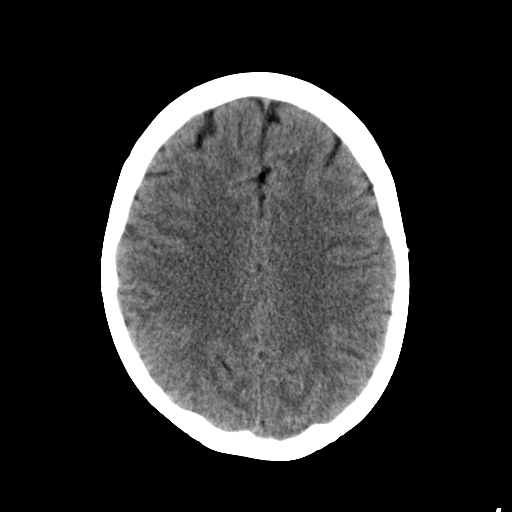
[im 18/28  bone]
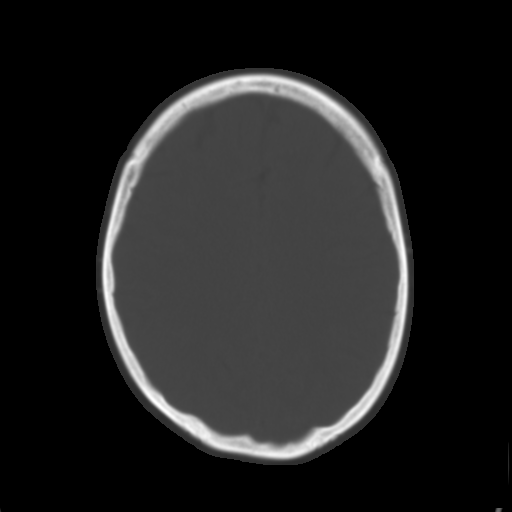
[im 20/28  brain]
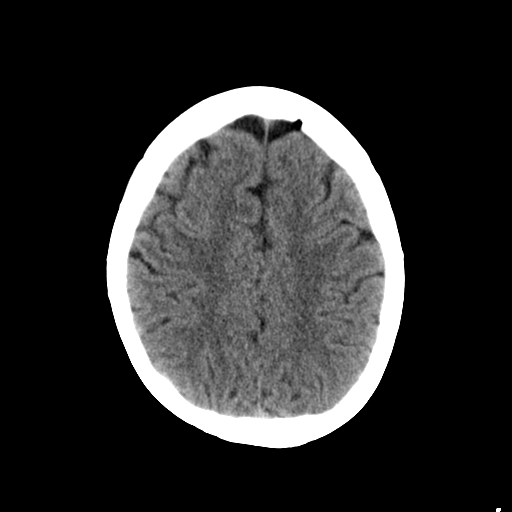
[im 22/28  brain]
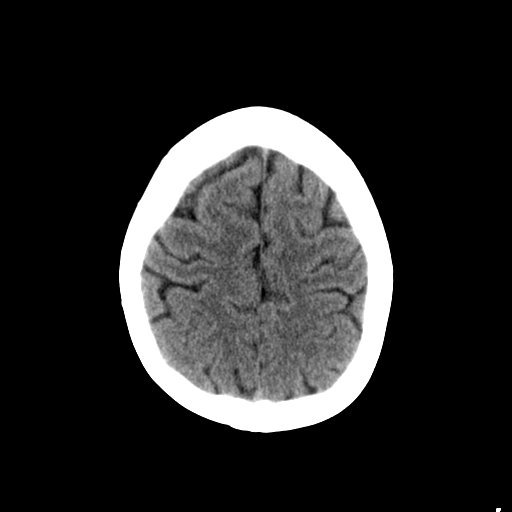
[im 24/28  brain]
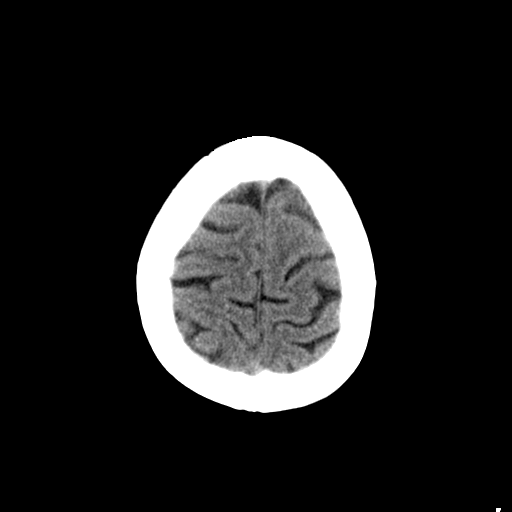
[im 26/28  brain]
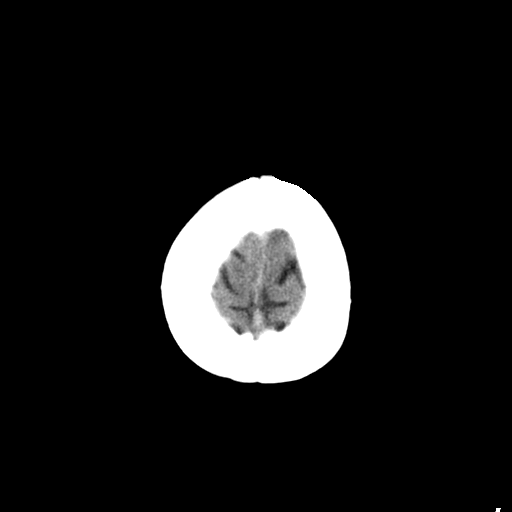
[im 26/28  bone]
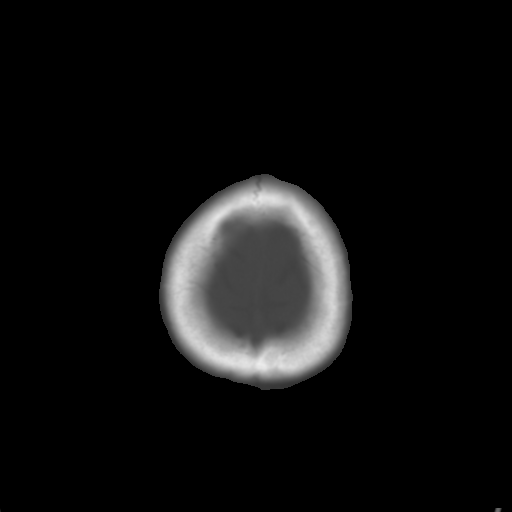

[Series 3: bone · axial · 0.43mm/px · z∈[-152,-112]mm · 3 of 28 slices shown]
[im 2/28  bone]
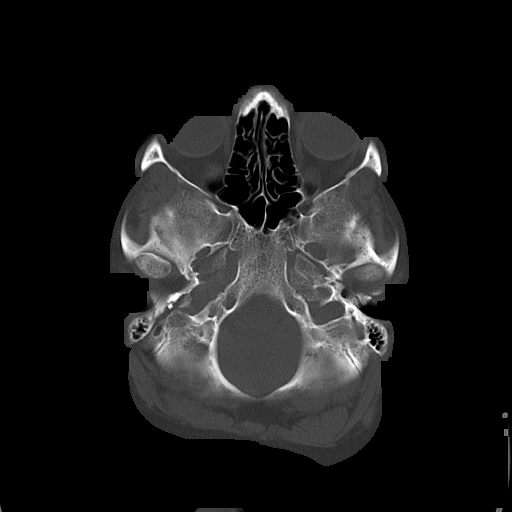
[im 6/28  bone]
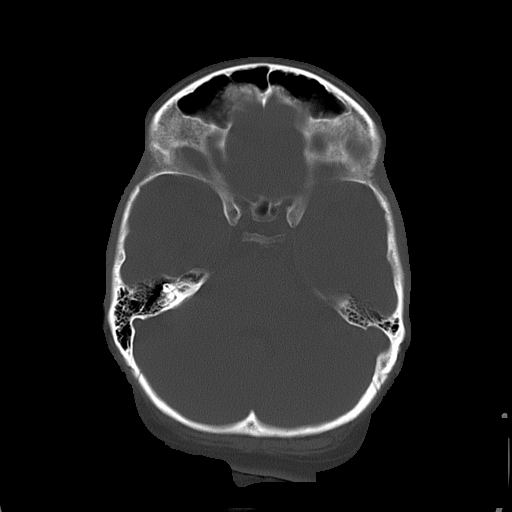
[im 10/28  bone]
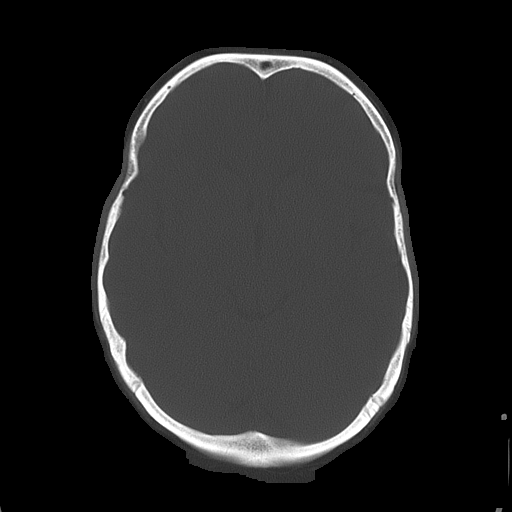

[16 of 30 positions shown; findings below may reference images not displayed]

PROCEDURE:     CT  - CT HEAD WITHOUT CONTRAST  - January 31, 2010  [DATE]

RESULT:     Noncontrast emergent CT of the brain is compared to the study
dated 01/29/2010.

The ventricles and sulci are normal. There is no hemorrhage. There is no
focal mass, mass-effect or midline shift. There is no evidence of edema or
territorial infarct. The bone windows demonstrate normal aeration of the
paranasal sinuses and mastoid air cells. There is no skull fracture
demonstrated.
IMPRESSION: 1. No acute intracranial abnormality. No significant interval change
demonstrated.

## 2011-07-24 ENCOUNTER — Emergency Department: Payer: Self-pay | Admitting: *Deleted

## 2011-12-27 ENCOUNTER — Ambulatory Visit: Payer: Self-pay

## 2012-07-21 LAB — COMPREHENSIVE METABOLIC PANEL
Albumin: 3.7 g/dL (ref 3.4–5.0)
Alkaline Phosphatase: 99 U/L (ref 50–136)
Anion Gap: 13 (ref 7–16)
BUN: 14 mg/dL (ref 7–18)
Bilirubin,Total: 0.2 mg/dL (ref 0.2–1.0)
Calcium, Total: 8.8 mg/dL (ref 8.5–10.1)
Chloride: 106 mmol/L (ref 98–107)
Co2: 23 mmol/L (ref 21–32)
Creatinine: 0.87 mg/dL (ref 0.60–1.30)
EGFR (African American): >60
EGFR (Non-African Amer.): >60
Glucose: 97 mg/dL (ref 65–99)
Osmolality: 284 (ref 275–301)
Potassium: 3.7 mmol/L (ref 3.5–5.1)
SGOT(AST): 11 U/L — ABNORMAL LOW (ref 15–37)
SGPT (ALT): 24 U/L (ref 12–78)
Sodium: 142 mmol/L (ref 136–145)
Total Protein: 7.4 g/dL (ref 6.4–8.2)

## 2012-07-21 LAB — CBC
HCT: 41.3 % (ref 35.0–47.0)
HGB: 14.3 g/dL (ref 12.0–16.0)
MCH: 32.5 pg (ref 26.0–34.0)
MCHC: 34.6 g/dL (ref 32.0–36.0)
MCV: 94 fL (ref 80–100)
Platelet: 261 10*3/uL (ref 150–440)
RBC: 4.4 10*6/uL (ref 3.80–5.20)
RDW: 13.6 % (ref 11.5–14.5)
WBC: 10.4 10*3/uL (ref 3.6–11.0)

## 2012-07-21 LAB — TROPONIN I: Troponin-I: <0.02 ng/mL

## 2012-07-22 ENCOUNTER — Inpatient Hospital Stay: Payer: Self-pay | Admitting: Internal Medicine

## 2012-07-22 DIAGNOSIS — R079 Chest pain, unspecified: Secondary | ICD-10-CM

## 2012-07-22 LAB — URINALYSIS, COMPLETE
Bilirubin,UR: NEGATIVE
Glucose,UR: NEGATIVE mg/dL (ref 0–75)
Ketone: NEGATIVE
Leukocyte Esterase: NEGATIVE
Nitrite: NEGATIVE
Ph: 5 (ref 4.5–8.0)
Protein: NEGATIVE
RBC,UR: 1 /HPF (ref 0–5)
Specific Gravity: 1.016 (ref 1.003–1.030)
Squamous Epithelial: 3
WBC UR: 2 /HPF (ref 0–5)

## 2012-07-22 LAB — CK TOTAL AND CKMB (NOT AT ARMC)
CK, Total: 39 U/L (ref 21–215)
CK, Total: 38 U/L (ref 21–215)
CK, Total: 36 U/L (ref 21–215)
CK-MB: 0.5 ng/mL (ref 0.5–3.6)
CK-MB: <0.5 ng/mL — ABNORMAL LOW (ref 0.5–3.6)
CK-MB: <0.5 ng/mL — ABNORMAL LOW (ref 0.5–3.6)

## 2012-07-22 LAB — SEDIMENTATION RATE: Erythrocyte Sed Rate: 11 mm/hr (ref 0–30)

## 2012-07-22 LAB — LIPID PANEL
Cholesterol: 304 mg/dL — ABNORMAL HIGH (ref 0–200)
HDL Cholesterol: 51 mg/dL (ref 40–60)
Ldl Cholesterol, Calc: 191 mg/dL — ABNORMAL HIGH (ref 0–100)
Triglycerides: 311 mg/dL — ABNORMAL HIGH (ref 0–200)
VLDL Cholesterol, Calc: 62 mg/dL — ABNORMAL HIGH (ref 5–40)

## 2012-07-22 LAB — TROPONIN I
Troponin-I: <0.02 ng/mL
Troponin-I: <0.02 ng/mL

## 2012-07-22 LAB — HEMOGLOBIN A1C: Hemoglobin A1C: 5.7 % (ref 4.2–6.3)

## 2012-07-22 LAB — LIPASE, BLOOD: Lipase: 245 U/L (ref 73–393)

## 2012-07-23 LAB — COMPREHENSIVE METABOLIC PANEL
Albumin: 3.3 g/dL — ABNORMAL LOW (ref 3.4–5.0)
Alkaline Phosphatase: 88 U/L (ref 50–136)
Anion Gap: 12 (ref 7–16)
BUN: 16 mg/dL (ref 7–18)
Bilirubin,Total: 0.3 mg/dL (ref 0.2–1.0)
Calcium, Total: 8.9 mg/dL (ref 8.5–10.1)
Chloride: 107 mmol/L (ref 98–107)
Co2: 23 mmol/L (ref 21–32)
Creatinine: 0.93 mg/dL (ref 0.60–1.30)
EGFR (African American): >60
EGFR (Non-African Amer.): >60
Glucose: 140 mg/dL — ABNORMAL HIGH (ref 65–99)
Osmolality: 287 (ref 275–301)
Potassium: 3.8 mmol/L (ref 3.5–5.1)
SGOT(AST): 11 U/L — ABNORMAL LOW (ref 15–37)
SGPT (ALT): 19 U/L (ref 12–78)
Sodium: 142 mmol/L (ref 136–145)
Total Protein: 6.8 g/dL (ref 6.4–8.2)

## 2012-07-23 LAB — CBC WITH DIFFERENTIAL/PLATELET
Basophil #: 0 10*3/uL (ref 0.0–0.1)
Basophil %: 0.5 %
Eosinophil #: 0.2 10*3/uL (ref 0.0–0.7)
Eosinophil %: 2.5 %
HCT: 39.4 % (ref 35.0–47.0)
HGB: 13.3 g/dL (ref 12.0–16.0)
Lymphocyte #: 3.1 10*3/uL (ref 1.0–3.6)
Lymphocyte %: 37.8 %
MCH: 31.7 pg (ref 26.0–34.0)
MCHC: 33.7 g/dL (ref 32.0–36.0)
MCV: 94 fL (ref 80–100)
Monocyte #: 0.4 x10 3/mm (ref 0.2–0.9)
Monocyte %: 5.5 %
Neutrophil #: 4.3 10*3/uL (ref 1.4–6.5)
Neutrophil %: 53.7 %
Platelet: 244 10*3/uL (ref 150–440)
RBC: 4.2 10*6/uL (ref 3.80–5.20)
RDW: 13.6 % (ref 11.5–14.5)
WBC: 8.1 10*3/uL (ref 3.6–11.0)

## 2012-07-23 LAB — PROTIME-INR
INR: 0.8
Prothrombin Time: 11.9 secs (ref 11.5–14.7)

## 2012-07-24 ENCOUNTER — Encounter: Payer: Self-pay | Admitting: *Deleted

## 2012-08-06 ENCOUNTER — Ambulatory Visit (INDEPENDENT_AMBULATORY_CARE_PROVIDER_SITE_OTHER): Payer: Self-pay | Admitting: Cardiovascular Disease

## 2012-08-06 ENCOUNTER — Encounter: Payer: Self-pay | Admitting: Cardiovascular Disease

## 2012-08-06 DIAGNOSIS — R079 Chest pain, unspecified: Secondary | ICD-10-CM

## 2012-08-06 DIAGNOSIS — R0989 Other specified symptoms and signs involving the circulatory and respiratory systems: Secondary | ICD-10-CM

## 2012-08-06 NOTE — Progress Notes (Addendum)
HPI  This is a 51 year old Caucasian female who is here today for a followup visit from [facility] regarding chest pain. She has past medical history of hypertension, hyperlipidemia, morbid obesity and GERD. She was hospitalized briefly at [facility] late last month with leg weakness and stuttering speech which was worrisome for possible acute cerebrovascular accident. However, she underwent full workup including MRI, echocardiogram and carotid duplex. All were unremarkable. Echocardiogram showed normal LV systolic function. While she was in the hospital, she had episodes of sharp chest discomfort which has been happening over the last few months. She describes substernal sharp discomfort lasting from a few seconds to a few minutes. This happens at rest and not with physical activities. When she exerts herself, she complains of significant dyspnea. She doesn't exercise on a regular basis. She has chronic back pain and lower extremity arthritis. Her cardiac enzymes were negative. She was referred here to consider a stress test.  Allergies no known allergies   Current Outpatient Prescriptions on File Prior to Visit  Medication Sig Dispense Refill  . aspirin 325 MG tablet Take 325 mg by mouth daily.      [name] atorvastatin (LIPITOR) 40 MG tablet Take 40 mg by mouth daily.      . citalopram (CELEXA) 20 MG tablet Take 20 mg by mouth daily.      . metoprolol tartrate (LOPRESSOR) 25 MG tablet Take 25 mg by mouth 2 (two) times daily.         Past Medical History  Diagnosis Date  . Hypertension   . Hyperlipidemia   . GERD (gastroesophageal reflux disease)   . Bell's palsy   . Morbid obesity with BMI of 40.0-44.9, adult      Past Surgical History  Procedure Date  . Cholecystectomy   . Vaginal hysterectomy      Family History  Problem Relation Age of Onset  . Hypertension Mother   . Heart disease Mother   . Heart attack Mother   . Hypertension Father      History   Social History  . Marital  Status: Single    Spouse Name: N/A    Number of Children: N/A  . Years of Education: N/A   Occupational History  . Not on file.   Social History Main Topics  . Smoking status: Current Every Day Smoker    Types: Cigarettes  . Smokeless tobacco: Not on file  . Alcohol Use: No  . Drug Use: No  . Sexually Active:    Other Topics Concern  . Not on file   Social History Narrative  . No narrative on file     PHYSICAL EXAM   There were no vitals taken for this visit.  Constitutional: She is oriented to person, place, and time. She appears well-developed and well-nourished. No distress.  HENT: No nasal discharge.  Head: Normocephalic and atraumatic.  Eyes: Pupils are equal and round. Right eye exhibits no discharge. Left eye exhibits no discharge.  Neck: Normal range of motion. Neck supple. No JVD present. No thyromegaly present.  Cardiovascular: Normal rate, regular rhythm, normal heart sounds. Exam reveals no gallop and no friction rub. No murmur heard.  Pulmonary/Chest: Effort normal and breath sounds normal. No stridor. No respiratory distress. She has no wheezes. She has no rales. She exhibits no tenderness.  Abdominal: Soft. Bowel sounds are normal. She exhibits no distension. There is no tenderness. There is no rebound and no guarding.  Musculoskeletal: Normal range of motion. She exhibits  no edema and no tenderness.  Neurological: She is alert and oriented to person, place, and time. Coordination normal.  Skin: Skin is warm and dry. No rash noted. She is not diaphoretic. No erythema. No pallor.  Psychiatric: She has a normal mood and affect. Her behavior is normal. Judgment and thought content normal.    EKG: NSR.    ASSESSMENT AND PLAN

## 2012-08-06 NOTE — Assessment & Plan Note (Signed)
Her chest pain is overall atypical and does not seem to be cardiac in nature. However, she complains of significant exertional dyspnea and has multiple risk factors for coronary artery disease. Thus, I think it's reasonable to evaluate with a cardiac stress testing for risk stratification. The patient is not able to exercise on a treadmill due to obesity, back pain and arthritis. Thus, I recommend a pharmacologic nuclear stress test. Aggressive treatment of risk factors is recommended.

## 2012-08-06 NOTE — Patient Instructions (Signed)
Your physician has requested that you have a lexiscan myoview. For further information please visit [email or web address]. Please follow instruction sheet, as given.  Follow up as needed 

## 2012-08-13 ENCOUNTER — Telehealth: Payer: Self-pay | Admitting: Cardiovascular Disease

## 2012-08-13 ENCOUNTER — Ambulatory Visit: Payer: Self-pay | Admitting: Cardiovascular Disease

## 2012-08-13 DIAGNOSIS — R079 Chest pain, unspecified: Secondary | ICD-10-CM

## 2012-08-13 NOTE — Telephone Encounter (Signed)
Called patient to let her know that her stress images from her myoview were normal today per Dr. Mariah Milling.  He wanted me to let her know that she did not need to come for 2nd part tomorrow the resting images.  Informed patient and she understood. Sending to [facility] as FYI since this is Dr. Kirke Corin patient and he ordered myoview.

## 2012-08-14 ENCOUNTER — Encounter: Payer: Self-pay | Admitting: Cardiovascular Disease

## 2012-08-14 NOTE — Telephone Encounter (Signed)
fyi

## 2012-08-19 ENCOUNTER — Other Ambulatory Visit: Payer: Self-pay

## 2012-08-19 ENCOUNTER — Ambulatory Visit: Payer: Self-pay | Admitting: Cardiovascular Disease

## 2012-08-19 ENCOUNTER — Encounter: Payer: Self-pay | Admitting: Cardiovascular Disease

## 2012-08-21 ENCOUNTER — Encounter: Payer: Self-pay | Admitting: Cardiovascular Disease

## 2012-09-01 ENCOUNTER — Emergency Department: Payer: Self-pay | Admitting: Emergency Medicine

## 2012-09-13 ENCOUNTER — Other Ambulatory Visit: Payer: Self-pay | Admitting: Cardiovascular Disease

## 2012-09-13 DIAGNOSIS — R079 Chest pain, unspecified: Secondary | ICD-10-CM

## 2012-09-18 ENCOUNTER — Emergency Department: Payer: Self-pay | Admitting: Emergency Medicine

## 2012-09-23 ENCOUNTER — Emergency Department: Payer: Self-pay | Admitting: Emergency Medicine

## 2012-10-10 ENCOUNTER — Emergency Department: Payer: Self-pay | Admitting: Emergency Medicine

## 2012-12-22 ENCOUNTER — Emergency Department: Payer: Self-pay | Admitting: Emergency Medicine

## 2012-12-22 LAB — BASIC METABOLIC PANEL
Anion Gap: 9 (ref 7–16)
BUN: 17 mg/dL (ref 7–18)
Calcium, Total: 8.4 mg/dL — ABNORMAL LOW (ref 8.5–10.1)
Chloride: 103 mmol/L (ref 98–107)
Co2: 24 mmol/L (ref 21–32)
Creatinine: 0.79 mg/dL (ref 0.60–1.30)
EGFR (African American): >60
EGFR (Non-African Amer.): >60
Glucose: 124 mg/dL — ABNORMAL HIGH (ref 65–99)
Osmolality: 275 (ref 275–301)
Potassium: 3.6 mmol/L (ref 3.5–5.1)
Sodium: 136 mmol/L (ref 136–145)

## 2012-12-22 LAB — CBC
HCT: 44.9 % (ref 35.0–47.0)
HGB: 15.1 g/dL (ref 12.0–16.0)
MCH: 31 pg (ref 26.0–34.0)
MCHC: 33.7 g/dL (ref 32.0–36.0)
MCV: 92 fL (ref 80–100)
Platelet: 242 10*3/uL (ref 150–440)
RBC: 4.87 10*6/uL (ref 3.80–5.20)
RDW: 13.6 % (ref 11.5–14.5)
WBC: 9.4 10*3/uL (ref 3.6–11.0)

## 2012-12-22 LAB — TROPONIN I: Troponin-I: <0.02 ng/mL

## 2012-12-22 LAB — CK TOTAL AND CKMB (NOT AT ARMC)
CK, Total: 37 U/L (ref 21–215)
CK-MB: <0.5 ng/mL — ABNORMAL LOW (ref 0.5–3.6)

## 2012-12-30 ENCOUNTER — Emergency Department: Payer: Self-pay | Admitting: Emergency Medicine

## 2012-12-31 ENCOUNTER — Encounter: Payer: Self-pay | Admitting: Cardiovascular Disease

## 2013-01-06 IMAGING — CR DG CHEST 1V PORT
1 series · 1 of 1 positions shown · non-contrast
Comparison: none

REASON FOR EXAM: chest pain
COMMENTS:

[view not recorded]
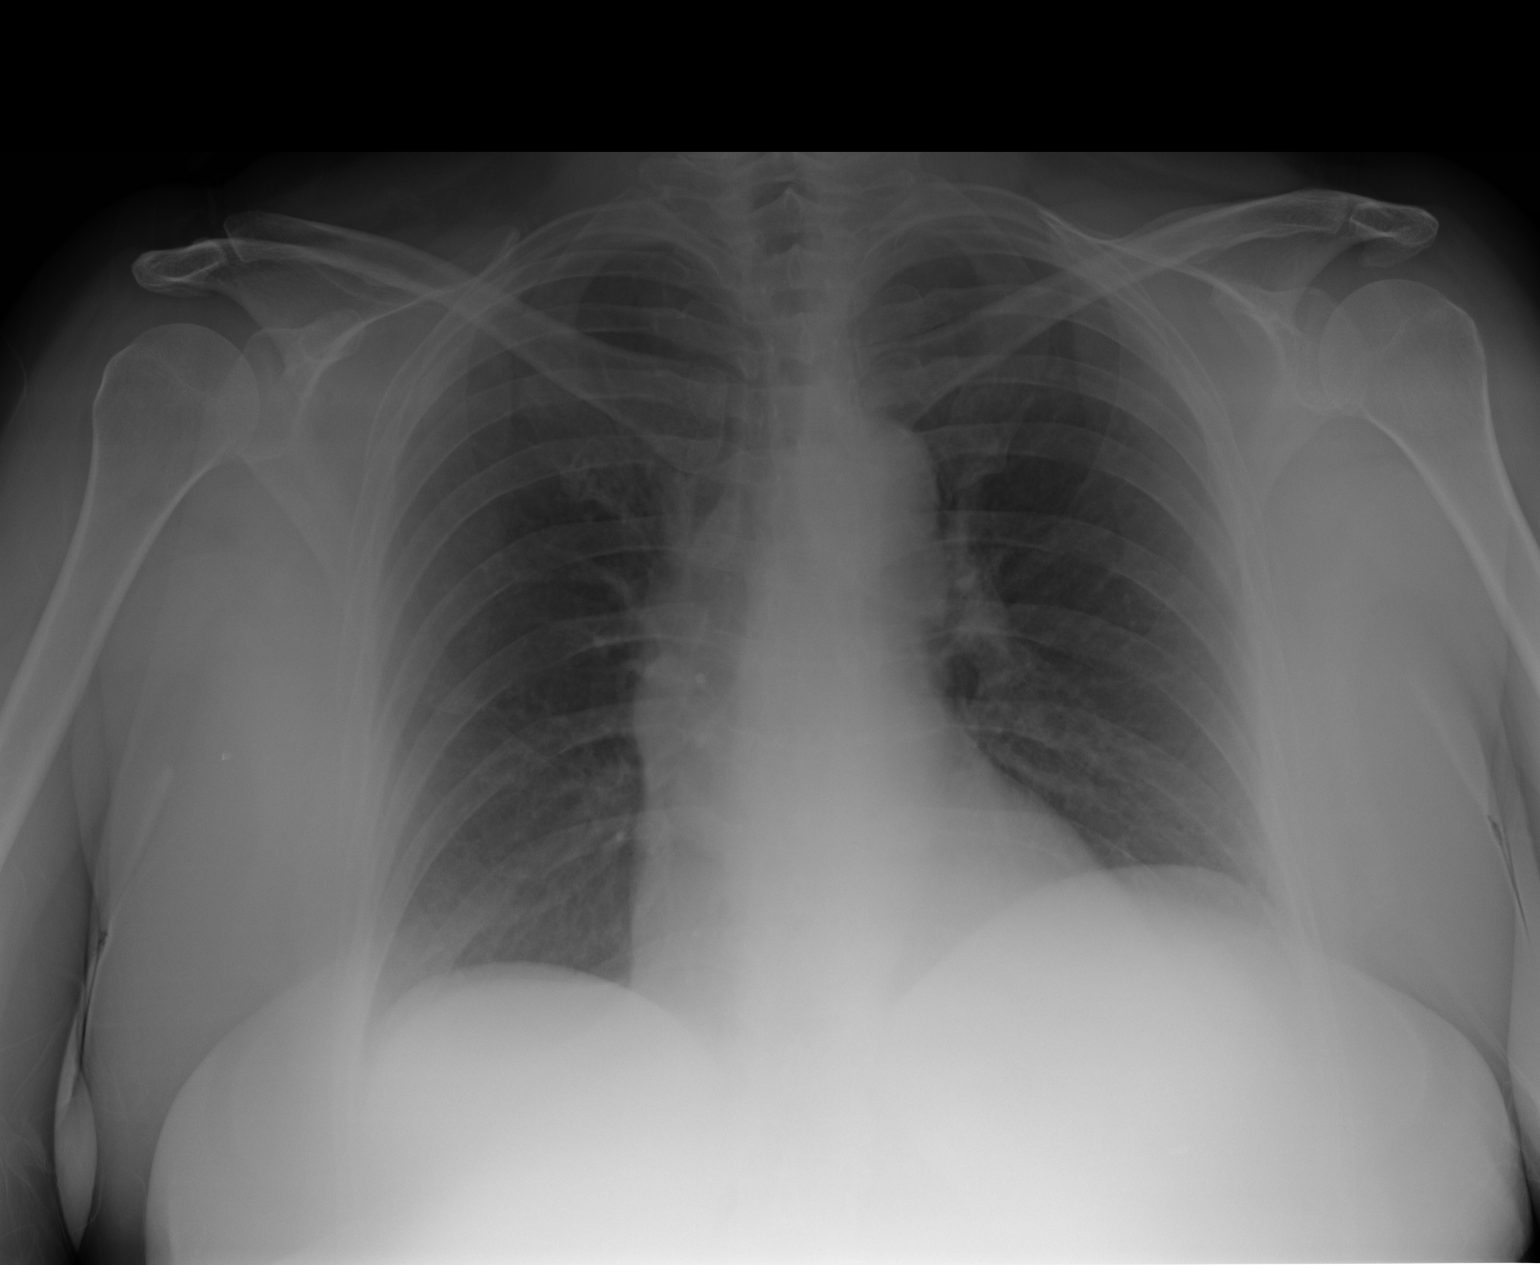

[1 of 1 positions shown; findings below may reference images not displayed]

PROCEDURE:     DXR - DXR PORTABLE CHEST SINGLE VIEW  - July 24, 2011  [DATE]

RESULT:     There is no previous exam for comparison.

The lungs are clear. The heart and pulmonary vessels are normal. The bony
and mediastinal structures are unremarkable. There is no effusion. There is
no pneumothorax or evidence of congestive failure.
IMPRESSION: No acute cardiopulmonary disease.

## 2013-01-06 IMAGING — US ULTRASOUND AORTA
1 series · 17 of 20 positions shown · non-contrast
Comparison: none

REASON FOR EXAM: back pain with htn
COMMENTS:

[Series 1: ultrasound aorta · 17 of 20 slices shown]
[im 1/20]
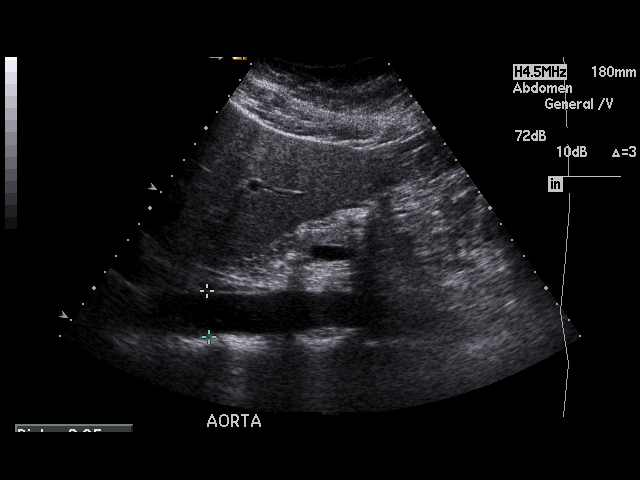
[im 2/20]
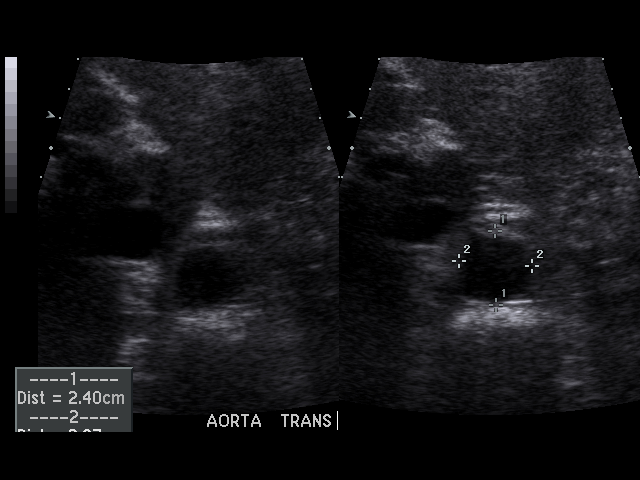
[im 3/20]
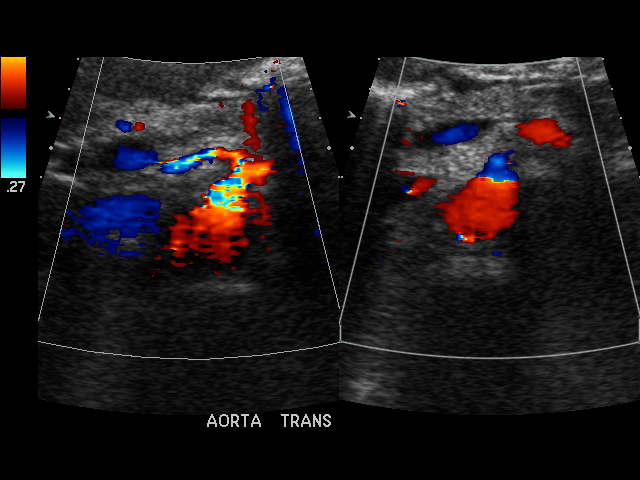
[im 5/20]
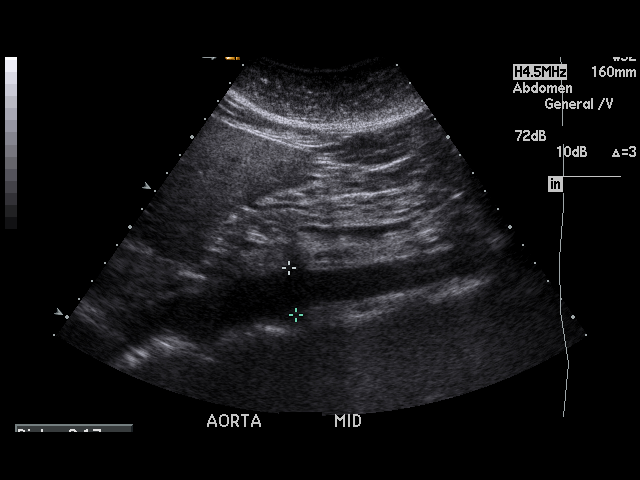
[im 6/20]
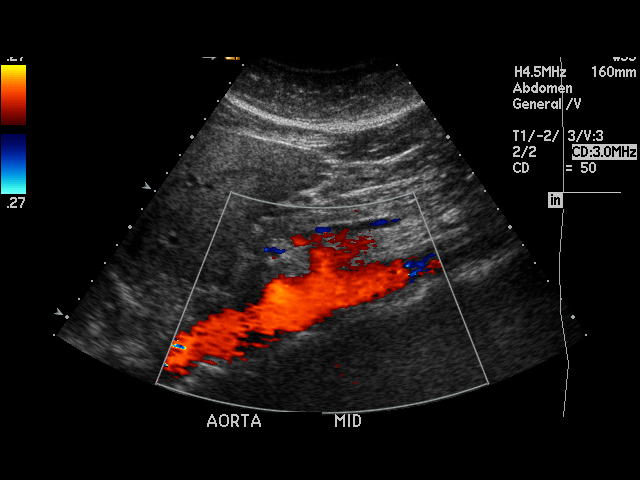
[im 7/20]
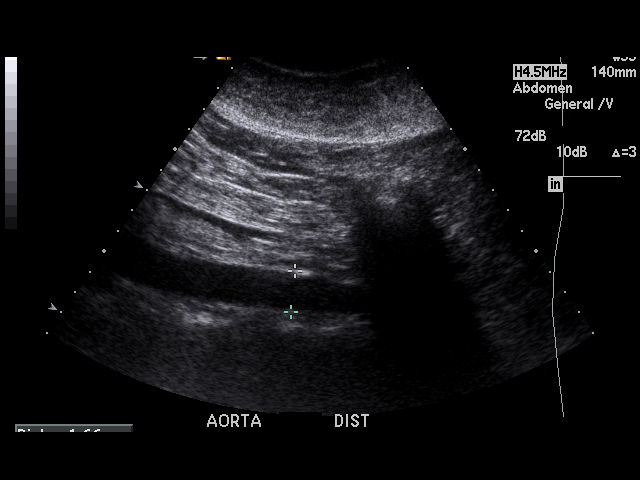
[im 8/20]
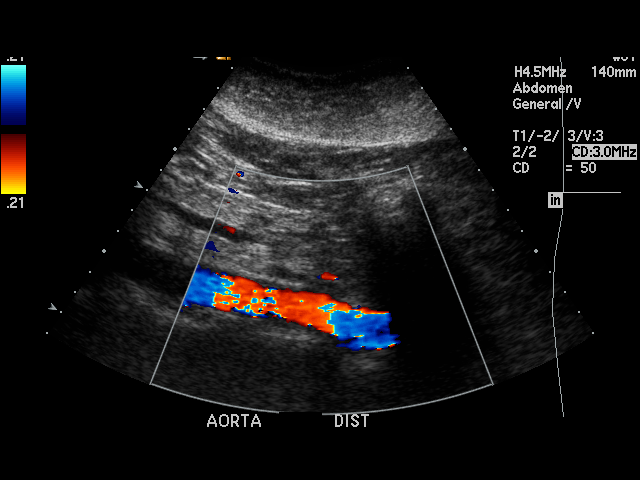
[im 9/20]
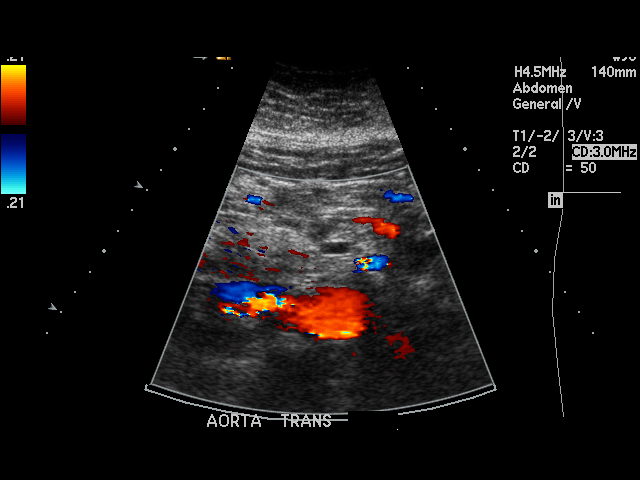
[im 11/20]
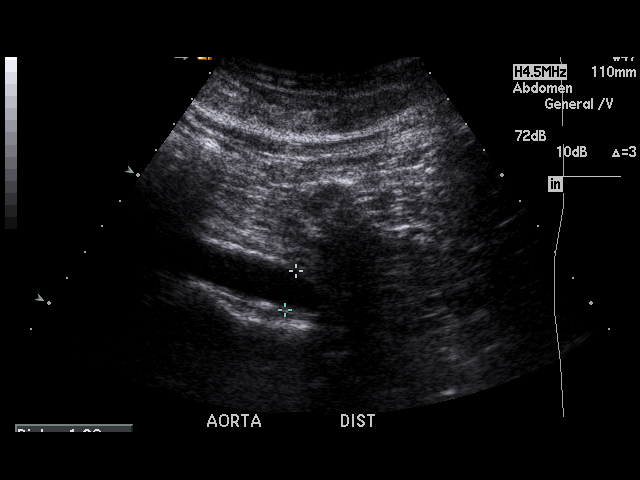
[im 12/20]
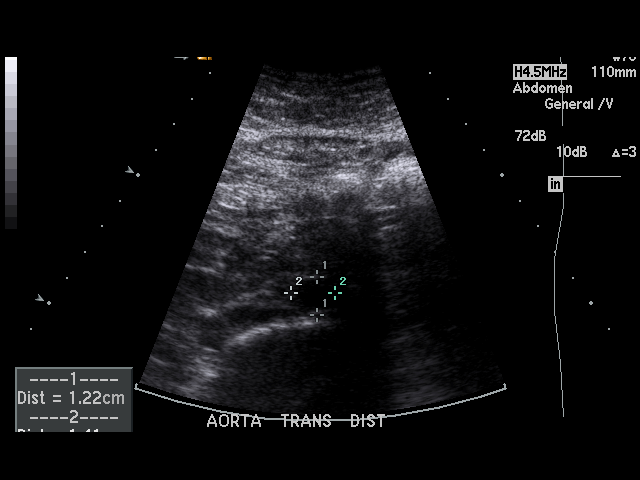
[im 13/20]
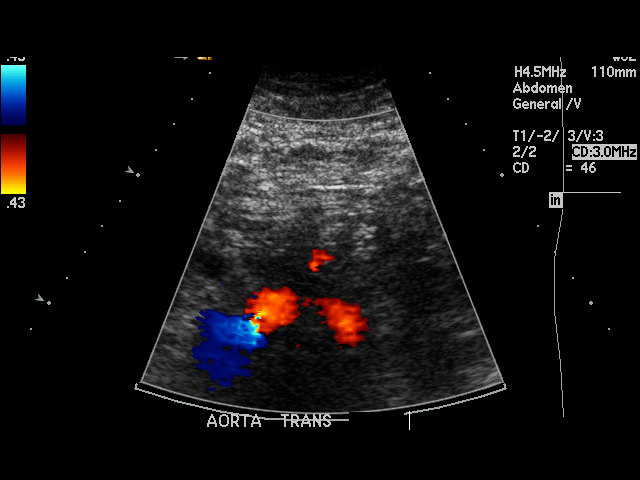
[im 14/20]
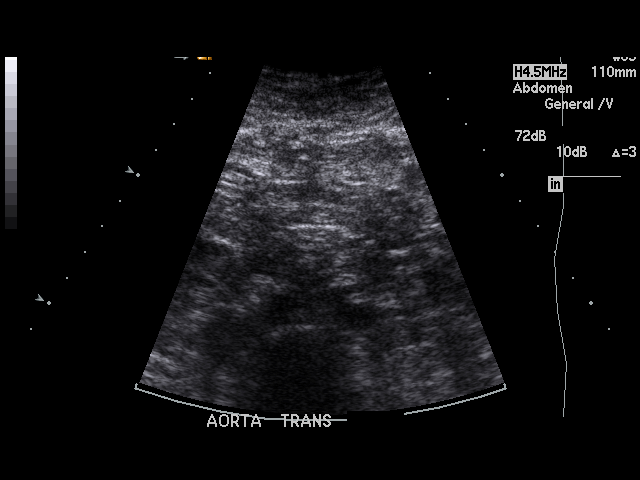
[im 15/20]
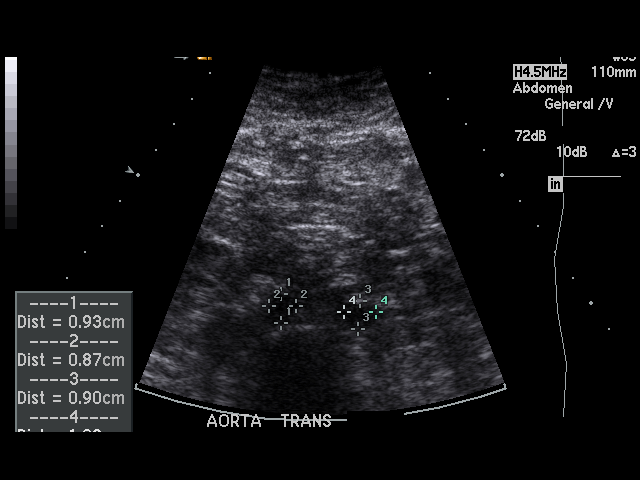
[im 16/20]
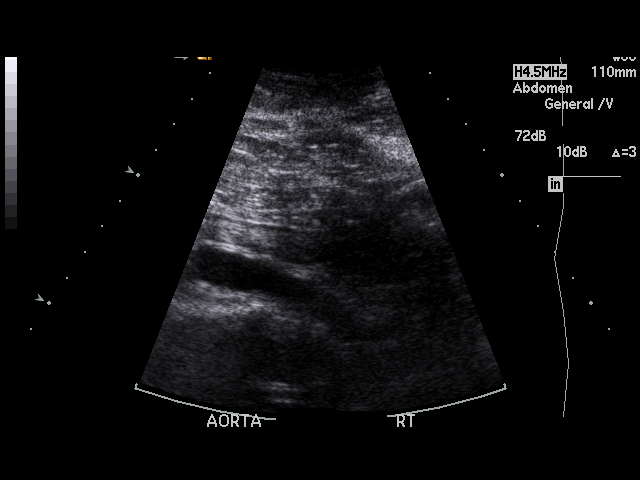
[im 18/20]
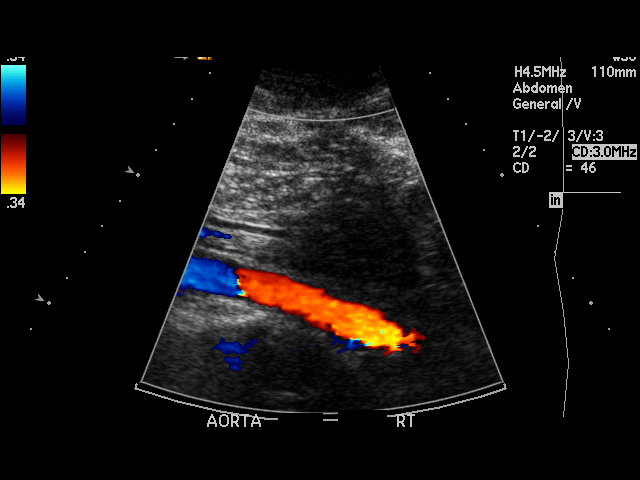
[im 19/20]
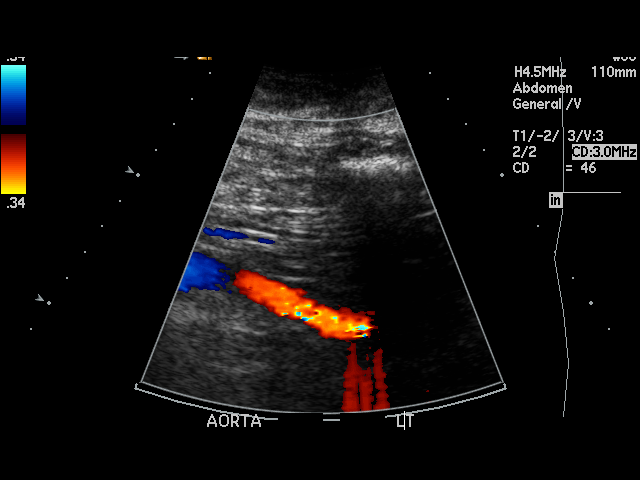
[im 20/20]
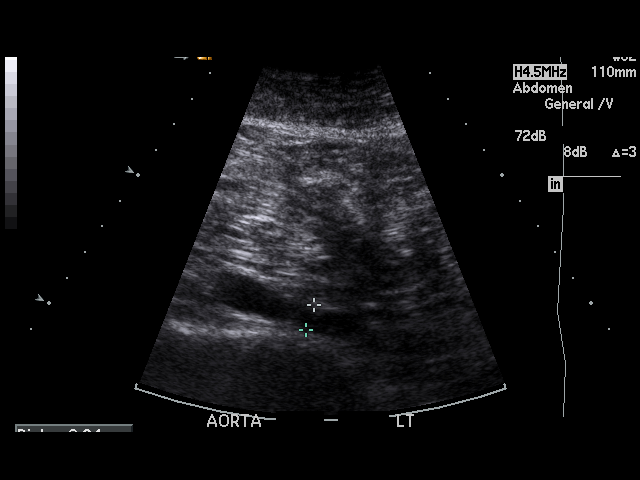

[17 of 20 positions shown; findings below may reference images not displayed]

PROCEDURE:     US  - US AORTA  - July 24, 2011  [DATE]

RESULT:     The abdominal aorta demonstrates normal tapering distally
without evidence of aortic or iliac aneurysmal dilation. Proximal aorta
shows maximal AP dimension of 2.4 cm with a transverse dimension of 2.37 cm.
Distally the anterior posterior dimension is 1.7 cm to 1.3 cm.
IMPRESSION: No evidence of abdominal aortic aneurysm.

## 2013-03-05 ENCOUNTER — Emergency Department: Payer: Self-pay | Admitting: Internal Medicine

## 2013-03-05 LAB — URINALYSIS, COMPLETE
Bilirubin,UR: NEGATIVE
Glucose,UR: NEGATIVE mg/dL (ref 0–75)
Hyaline Cast: 1
Ketone: NEGATIVE
Nitrite: NEGATIVE
Ph: 5 (ref 4.5–8.0)
Protein: NEGATIVE
RBC,UR: 43 /HPF (ref 0–5)
Specific Gravity: 1.028 (ref 1.003–1.030)
Squamous Epithelial: 10
WBC UR: 55 /HPF (ref 0–5)

## 2013-03-20 ENCOUNTER — Emergency Department: Payer: Self-pay | Admitting: Emergency Medicine

## 2013-04-02 ENCOUNTER — Ambulatory Visit: Payer: Self-pay

## 2013-04-13 ENCOUNTER — Emergency Department: Payer: Self-pay | Admitting: Internal Medicine

## 2013-04-17 ENCOUNTER — Emergency Department: Payer: Self-pay | Admitting: Unknown Physician Specialty

## 2013-04-17 LAB — BASIC METABOLIC PANEL
Anion Gap: 8 (ref 7–16)
BUN: 16 mg/dL (ref 7–18)
Calcium, Total: 9.1 mg/dL (ref 8.5–10.1)
Chloride: 105 mmol/L (ref 98–107)
Co2: 26 mmol/L (ref 21–32)
Creatinine: 0.85 mg/dL (ref 0.60–1.30)
EGFR (African American): >60
EGFR (Non-African Amer.): >60
Glucose: 82 mg/dL (ref 65–99)
Osmolality: 278 (ref 275–301)
Potassium: 3.5 mmol/L (ref 3.5–5.1)
Sodium: 139 mmol/L (ref 136–145)

## 2013-04-17 LAB — URINALYSIS, COMPLETE
Bilirubin,UR: NEGATIVE
Glucose,UR: NEGATIVE mg/dL (ref 0–75)
Ketone: NEGATIVE
Leukocyte Esterase: NEGATIVE
Nitrite: NEGATIVE
Ph: 6 (ref 4.5–8.0)
Protein: NEGATIVE
RBC,UR: 5 /HPF (ref 0–5)
Specific Gravity: 1.016 (ref 1.003–1.030)
Squamous Epithelial: 4
WBC UR: 1 /HPF (ref 0–5)

## 2013-04-17 LAB — CBC
HCT: 41 % (ref 35.0–47.0)
HGB: 13.8 g/dL (ref 12.0–16.0)
MCH: 30.1 pg (ref 26.0–34.0)
MCHC: 33.6 g/dL (ref 32.0–36.0)
MCV: 90 fL (ref 80–100)
Platelet: 279 10*3/uL (ref 150–440)
RBC: 4.57 10*6/uL (ref 3.80–5.20)
RDW: 14.4 % (ref 11.5–14.5)
WBC: 8.9 10*3/uL (ref 3.6–11.0)

## 2013-08-21 ENCOUNTER — Emergency Department: Payer: Self-pay | Admitting: Emergency Medicine

## 2013-08-21 LAB — COMPREHENSIVE METABOLIC PANEL
Albumin: 3.7 g/dL (ref 3.4–5.0)
Alkaline Phosphatase: 107 U/L (ref 50–136)
Anion Gap: 4 — ABNORMAL LOW (ref 7–16)
BUN: 19 mg/dL — ABNORMAL HIGH (ref 7–18)
Bilirubin,Total: 0.3 mg/dL (ref 0.2–1.0)
Calcium, Total: 8.9 mg/dL (ref 8.5–10.1)
Chloride: 108 mmol/L — ABNORMAL HIGH (ref 98–107)
Co2: 27 mmol/L (ref 21–32)
Creatinine: 0.79 mg/dL (ref 0.60–1.30)
EGFR (African American): >60
EGFR (Non-African Amer.): >60
Glucose: 85 mg/dL (ref 65–99)
Osmolality: 279 (ref 275–301)
Potassium: 3.9 mmol/L (ref 3.5–5.1)
SGOT(AST): 7 U/L — ABNORMAL LOW (ref 15–37)
SGPT (ALT): 18 U/L (ref 12–78)
Sodium: 139 mmol/L (ref 136–145)
Total Protein: 7 g/dL (ref 6.4–8.2)

## 2013-08-21 LAB — URINALYSIS, COMPLETE
Bacteria: NONE SEEN
Blood: NEGATIVE
Glucose,UR: NEGATIVE mg/dL (ref 0–75)
Ketone: NEGATIVE
Leukocyte Esterase: NEGATIVE
Nitrite: NEGATIVE
Ph: 5 (ref 4.5–8.0)
Protein: 30
RBC,UR: 9 /HPF (ref 0–5)
Specific Gravity: 1.038 (ref 1.003–1.030)
Squamous Epithelial: 8
WBC UR: 1 /HPF (ref 0–5)

## 2013-08-21 LAB — CBC
HCT: 40.1 % (ref 35.0–47.0)
HGB: 13.5 g/dL (ref 12.0–16.0)
MCH: 30.3 pg (ref 26.0–34.0)
MCHC: 33.6 g/dL (ref 32.0–36.0)
MCV: 90 fL (ref 80–100)
Platelet: 278 10*3/uL (ref 150–440)
RBC: 4.46 10*6/uL (ref 3.80–5.20)
RDW: 14.1 % (ref 11.5–14.5)
WBC: 7.5 10*3/uL (ref 3.6–11.0)

## 2013-08-21 LAB — GC/CHLAMYDIA PROBE AMP

## 2013-08-21 LAB — WET PREP, GENITAL

## 2013-10-03 ENCOUNTER — Emergency Department: Payer: Self-pay | Admitting: Emergency Medicine

## 2013-10-03 LAB — URINALYSIS, COMPLETE
Glucose,UR: NEGATIVE mg/dL (ref 0–75)
Ketone: NEGATIVE
Nitrite: NEGATIVE
Ph: 5 (ref 4.5–8.0)
Protein: 30
RBC,UR: 45 /HPF (ref 0–5)
Specific Gravity: 1.03 (ref 1.003–1.030)
Squamous Epithelial: 30
WBC UR: 400 /HPF (ref 0–5)

## 2013-10-05 LAB — URINE CULTURE

## 2013-10-06 ENCOUNTER — Emergency Department: Payer: Self-pay | Admitting: Emergency Medicine

## 2013-10-06 LAB — CBC WITH DIFFERENTIAL/PLATELET
Basophil #: 0.1 10*3/uL (ref 0.0–0.1)
Basophil %: 1.3 %
Eosinophil #: 0.1 10*3/uL (ref 0.0–0.7)
Eosinophil %: 2 %
HCT: 39.3 % (ref 35.0–47.0)
HGB: 12.8 g/dL (ref 12.0–16.0)
Lymphocyte #: 1.8 10*3/uL (ref 1.0–3.6)
Lymphocyte %: 25.5 %
MCH: 28.9 pg (ref 26.0–34.0)
MCHC: 32.6 g/dL (ref 32.0–36.0)
MCV: 89 fL (ref 80–100)
Monocyte #: 0.4 x10 3/mm (ref 0.2–0.9)
Monocyte %: 6 %
Neutrophil #: 4.5 10*3/uL (ref 1.4–6.5)
Neutrophil %: 65.2 %
Platelet: 256 10*3/uL (ref 150–440)
RBC: 4.43 10*6/uL (ref 3.80–5.20)
RDW: 14.2 % (ref 11.5–14.5)
WBC: 6.9 10*3/uL (ref 3.6–11.0)

## 2013-10-06 LAB — URINALYSIS, COMPLETE
Bacteria: NONE SEEN
Bilirubin,UR: NEGATIVE
Blood: NEGATIVE
Glucose,UR: NEGATIVE mg/dL (ref 0–75)
Ketone: NEGATIVE
Leukocyte Esterase: NEGATIVE
Nitrite: NEGATIVE
Ph: 5 (ref 4.5–8.0)
Protein: 30
RBC,UR: 27 /HPF (ref 0–5)
Specific Gravity: 1.033 (ref 1.003–1.030)
Squamous Epithelial: 9
WBC UR: 4 /HPF (ref 0–5)

## 2013-10-06 LAB — BASIC METABOLIC PANEL
Anion Gap: 6 — ABNORMAL LOW (ref 7–16)
BUN: 23 mg/dL — ABNORMAL HIGH (ref 7–18)
Calcium, Total: 9 mg/dL (ref 8.5–10.1)
Chloride: 109 mmol/L — ABNORMAL HIGH (ref 98–107)
Co2: 22 mmol/L (ref 21–32)
Creatinine: 1.01 mg/dL (ref 0.60–1.30)
EGFR (African American): >60
EGFR (Non-African Amer.): >60
Glucose: 122 mg/dL — ABNORMAL HIGH (ref 65–99)
Osmolality: 279 (ref 275–301)
Potassium: 3.8 mmol/L (ref 3.5–5.1)
Sodium: 137 mmol/L (ref 136–145)

## 2013-10-06 LAB — WET PREP, GENITAL

## 2013-10-06 LAB — GC/CHLAMYDIA PROBE AMP

## 2013-11-14 DIAGNOSIS — R102 Pelvic and perineal pain: Secondary | ICD-10-CM

## 2013-11-14 DIAGNOSIS — N3946 Mixed incontinence: Secondary | ICD-10-CM | POA: Insufficient documentation

## 2013-11-14 DIAGNOSIS — G8929 Other chronic pain: Secondary | ICD-10-CM | POA: Insufficient documentation

## 2013-12-25 ENCOUNTER — Emergency Department: Payer: Self-pay | Admitting: Emergency Medicine

## 2014-01-05 ENCOUNTER — Emergency Department: Payer: Self-pay | Admitting: Emergency Medicine

## 2014-01-05 LAB — TROPONIN I: Troponin-I: <0.02 ng/mL

## 2014-01-05 LAB — CBC
HCT: 37.3 % (ref 35.0–47.0)
HGB: 12.6 g/dL (ref 12.0–16.0)
MCH: 30.2 pg (ref 26.0–34.0)
MCHC: 33.8 g/dL (ref 32.0–36.0)
MCV: 89 fL (ref 80–100)
Platelet: 238 10*3/uL (ref 150–440)
RBC: 4.17 10*6/uL (ref 3.80–5.20)
RDW: 14.5 % (ref 11.5–14.5)
WBC: 6.1 10*3/uL (ref 3.6–11.0)

## 2014-01-05 LAB — BASIC METABOLIC PANEL
Anion Gap: 10 (ref 7–16)
BUN: 11 mg/dL (ref 7–18)
Calcium, Total: 8.2 mg/dL — ABNORMAL LOW (ref 8.5–10.1)
Chloride: 108 mmol/L — ABNORMAL HIGH (ref 98–107)
Co2: 21 mmol/L (ref 21–32)
Creatinine: 0.89 mg/dL (ref 0.60–1.30)
EGFR (African American): >60
EGFR (Non-African Amer.): >60
Glucose: 125 mg/dL — ABNORMAL HIGH (ref 65–99)
Osmolality: 278 (ref 275–301)
Potassium: 3.2 mmol/L — ABNORMAL LOW (ref 3.5–5.1)
Sodium: 139 mmol/L (ref 136–145)

## 2014-01-05 IMAGING — US US CAROTID DUPLEX BILAT
1 series · 14 of 24 positions shown · non-contrast
Comparison: none

REASON FOR EXAM: cva
COMMENTS:

[Series 1: us carotid duplex bilat · 0.07mm/px · 14 of 58 slices shown]
[im 1/58]
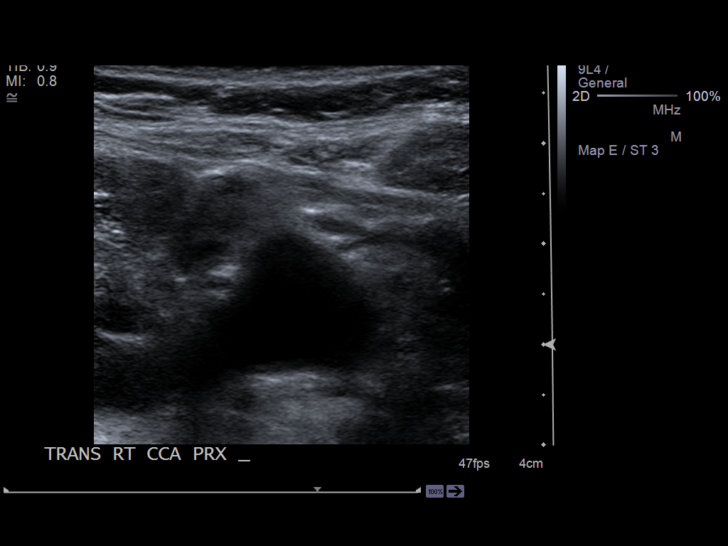
[im 5/58]
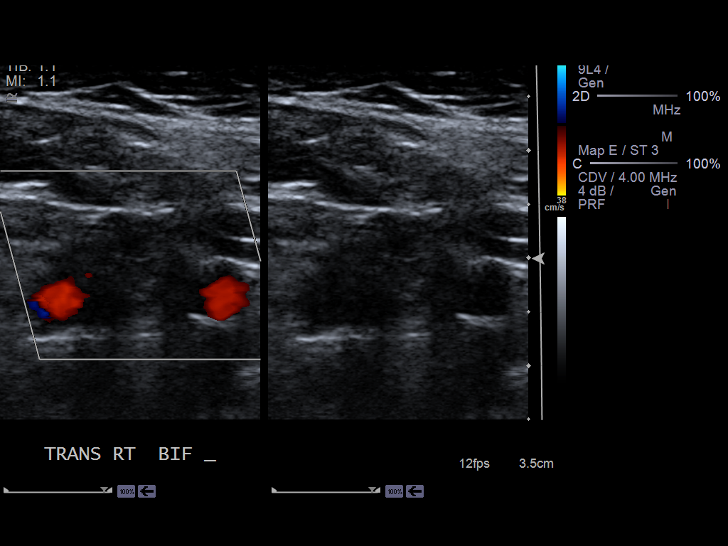
[im 10/58]
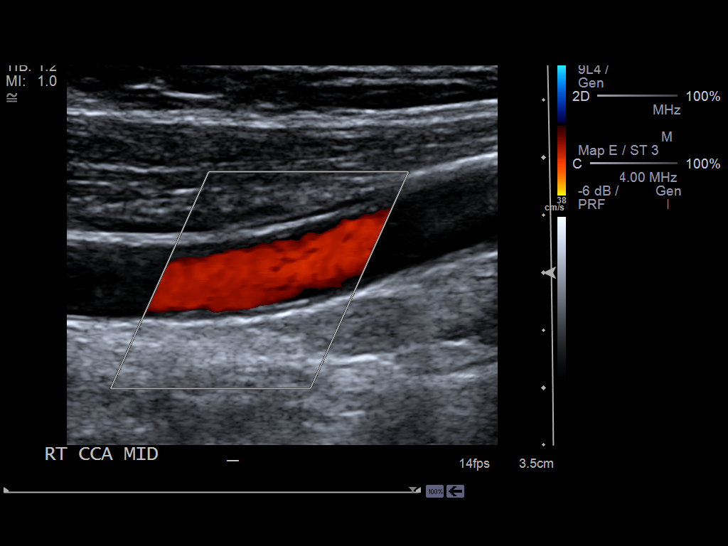
[im 15/58]
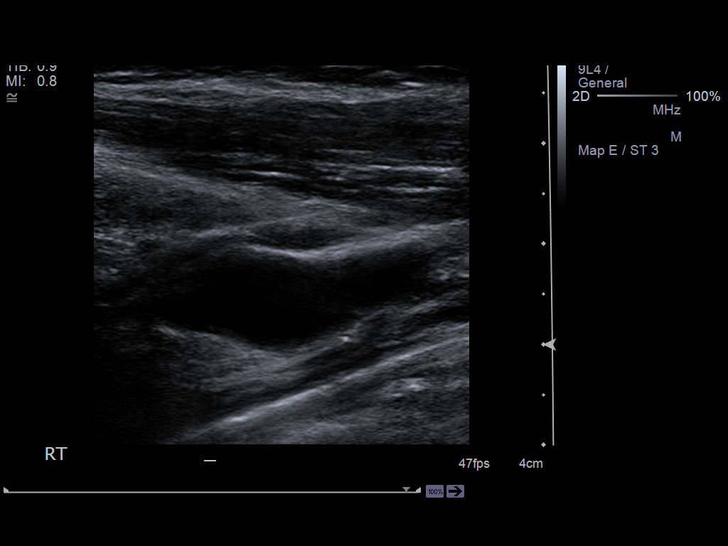
[im 18/58]
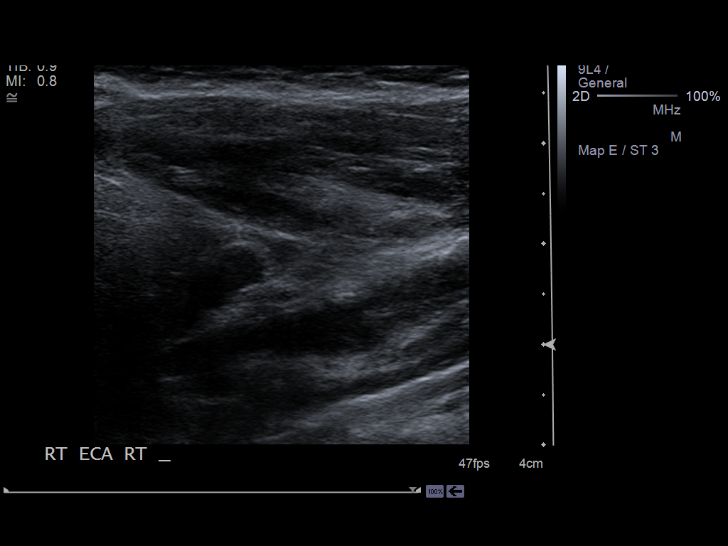
[im 23/58]
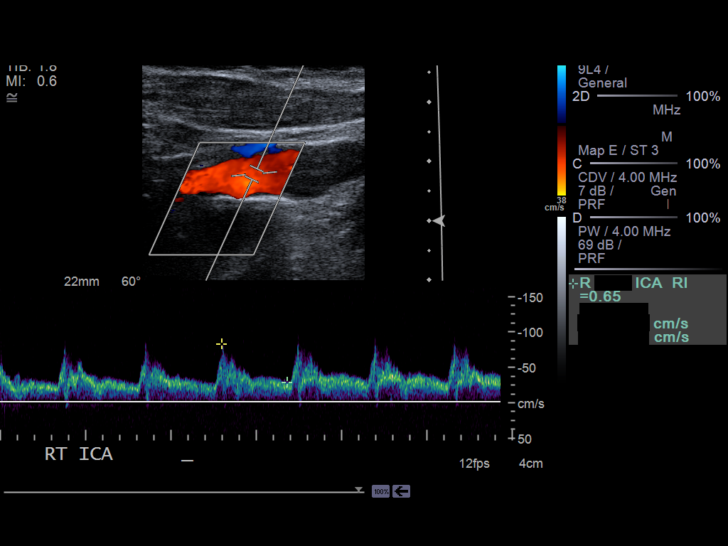
[im 28/58]
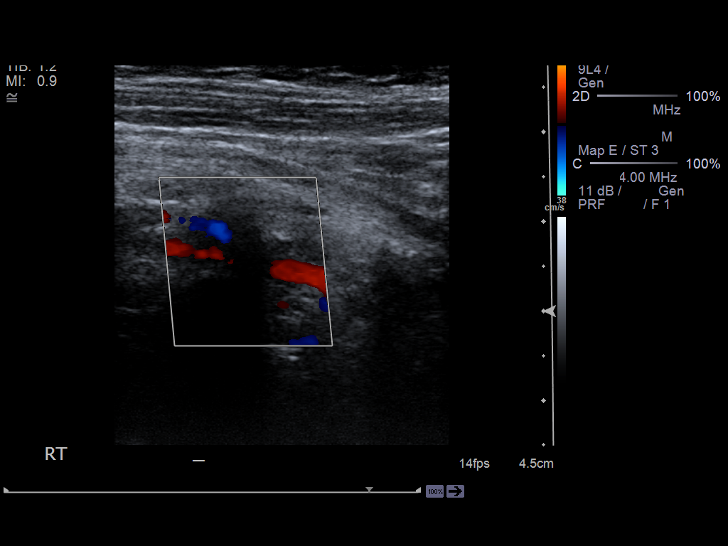
[im 30/58]
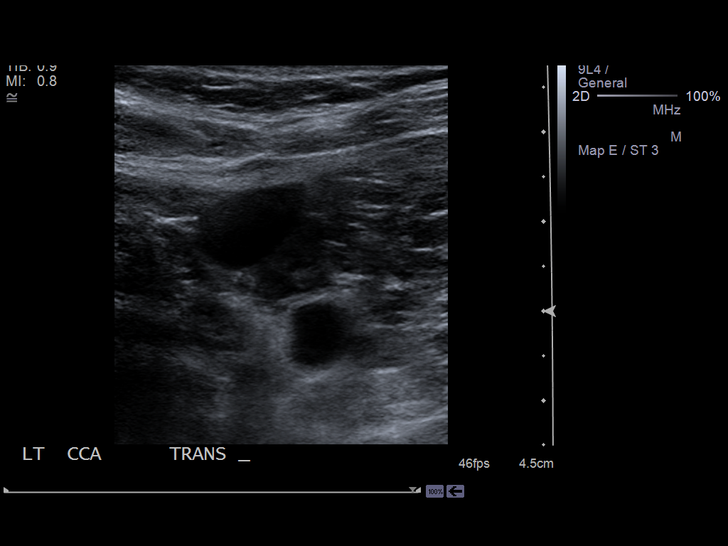
[im 35/58]
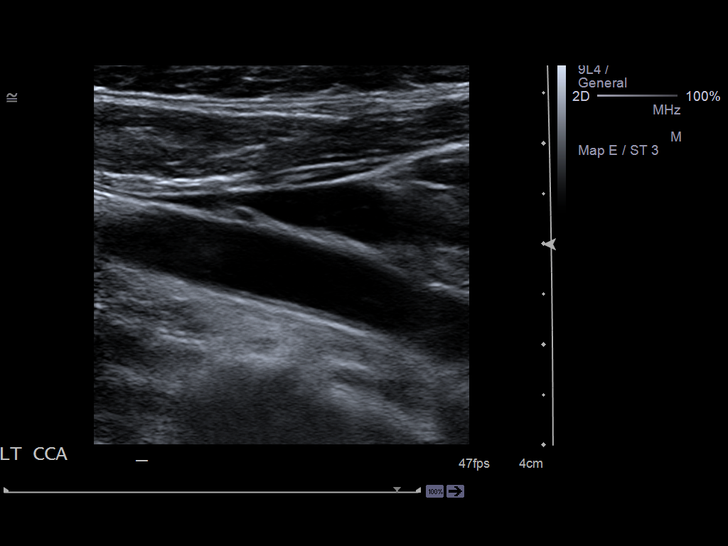
[im 40/58]
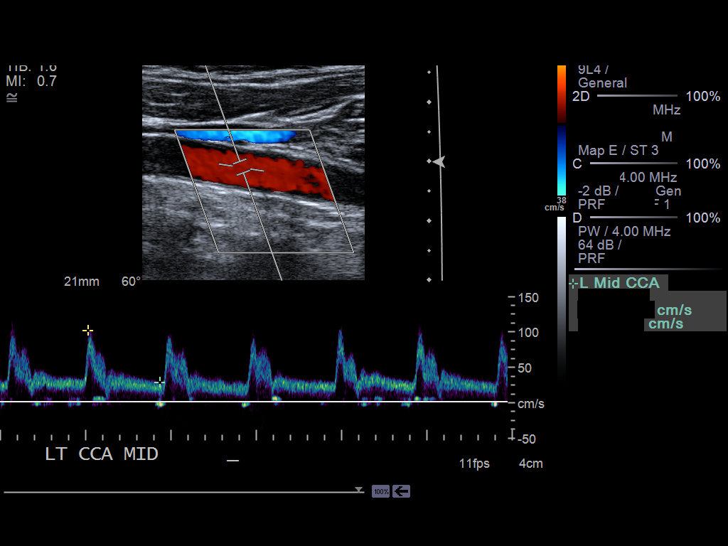
[im 45/58]
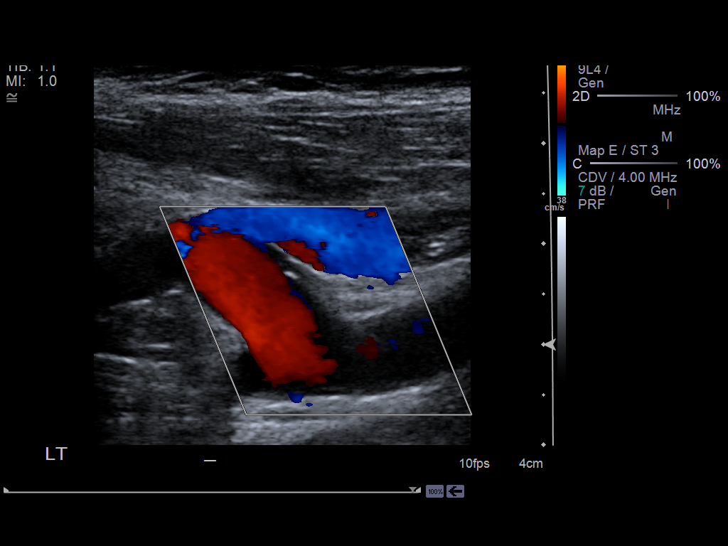
[im 48/58]
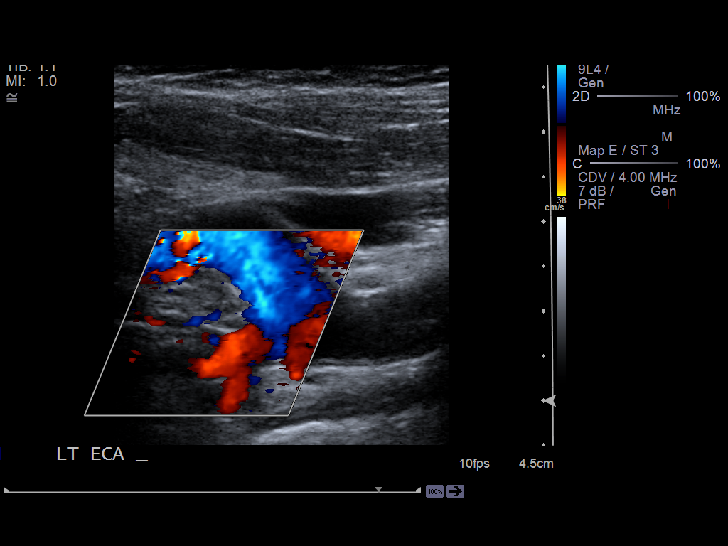
[im 53/58]
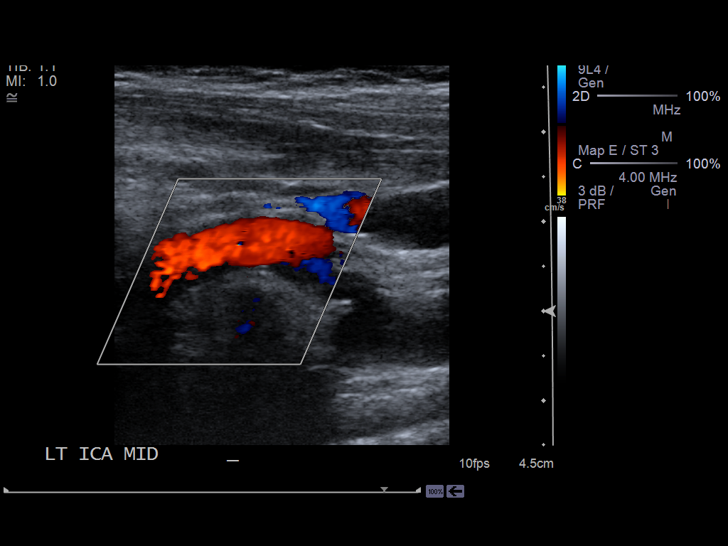
[im 58/58]
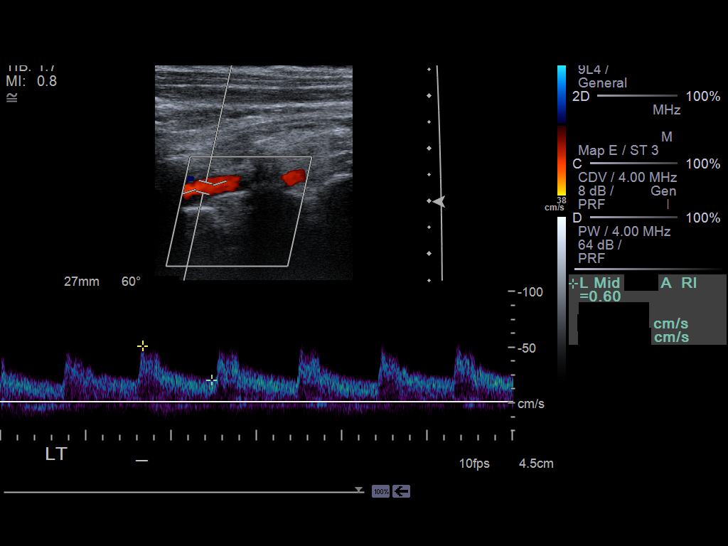

[14 of 24 positions shown; findings below may reference images not displayed]

PROCEDURE:     US  - US CAROTID DOPPLER BILATERAL  - July 22, 2012 [DATE]

RESULT:     The carotid arteries were evaluated with duplex sonography.
Neither the right nor left carotid systems demonstrate significant calcified
plaque. A small amount of soft plaque is present in the proximal ICA on the
left. The waveform patterns and color flow images do not suggest significant
turbulence.

On the right the peak internal carotid systolic velocity measured 111 cm per
second and the peak common carotid velocity measured 120 cm per second
corresponding to a normal ratio of 0.9. On the left the peak internal
carotid systolic velocity measured 118 cm per second and the peak common
carotid velocity measured 118 cm per second corresponding to normal ratio of
1.0. The vertebral arteries are normal in flow direction bilaterally.
IMPRESSION: There is no evidence of a hemodynamically significant
carotid stenosis.

[REDACTED]

## 2014-01-05 IMAGING — MR MRI HEAD WITHOUT CONTRAST
5 of 6 series · 36 of 48 positions shown · non-contrast
Comparison: none

REASON FOR EXAM: CVA
COMMENTS:

PROCEDURE:     MR  - MR BRAIN WO CONTRAST  - July 22, 2012 [DATE]
RESULT:     History: Slurred speech and language is.
Comparison Study: Head CT of 07/22/2012

[Series 2: T1 · sagittal · 5.0mm · 0.45mm/px · 4 of 28 slices shown]
[im 1/28]
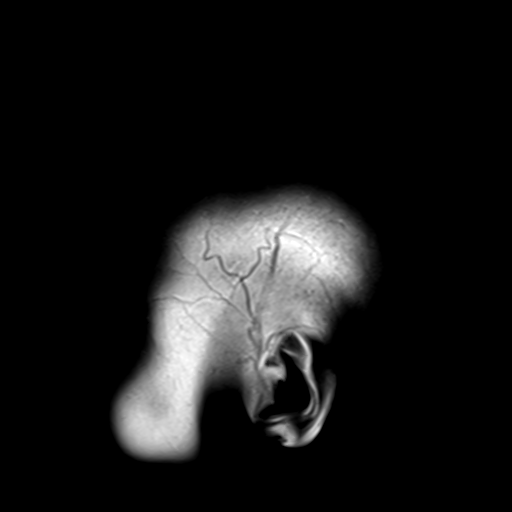
[im 4/28]
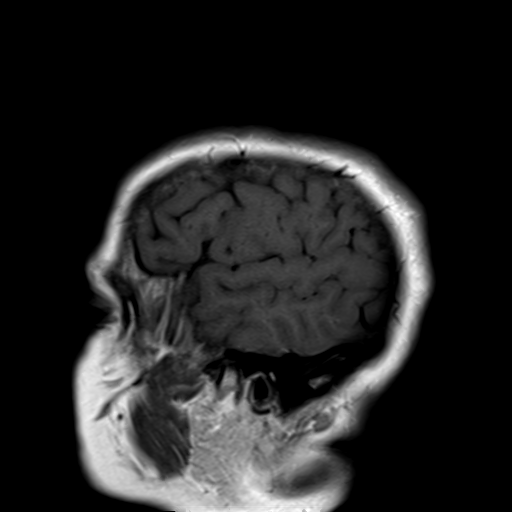
[im 8/28]
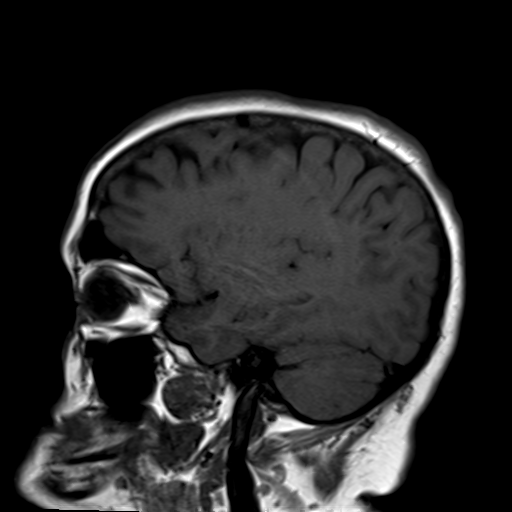
[im 12/28]
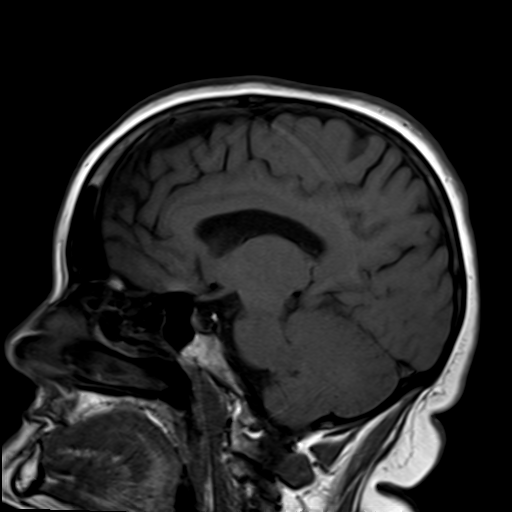

[Series 6: T2 · axial · 5.0mm · 0.60mm/px · z∈[-38,+159]mm · 8 of 26 slices shown]
[im 1/26]
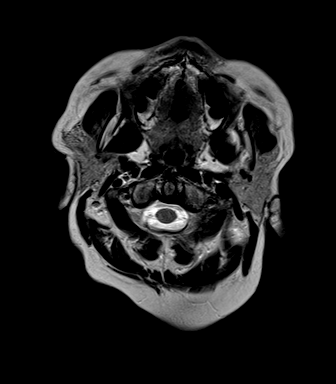
[im 4/26]
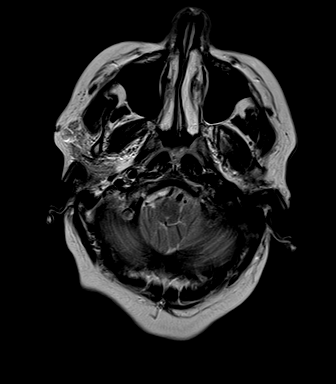
[im 8/26]
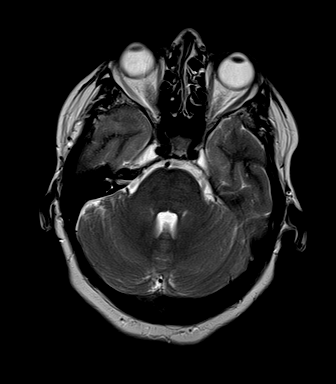
[im 11/26]
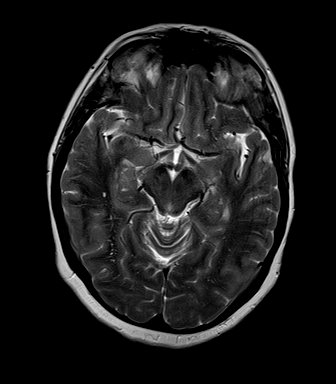
[im 15/26]
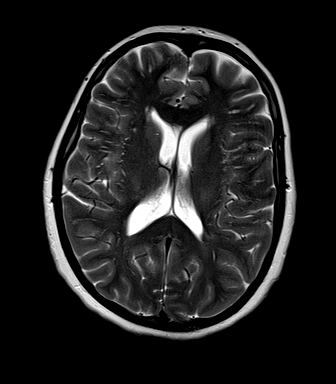
[im 18/26]
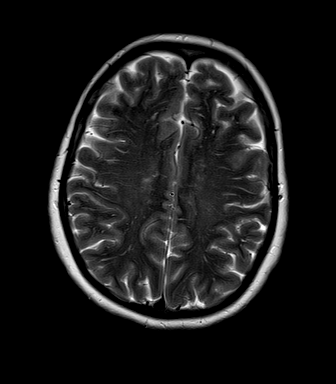
[im 22/26]
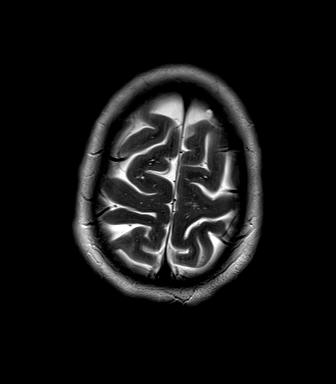
[im 26/26]
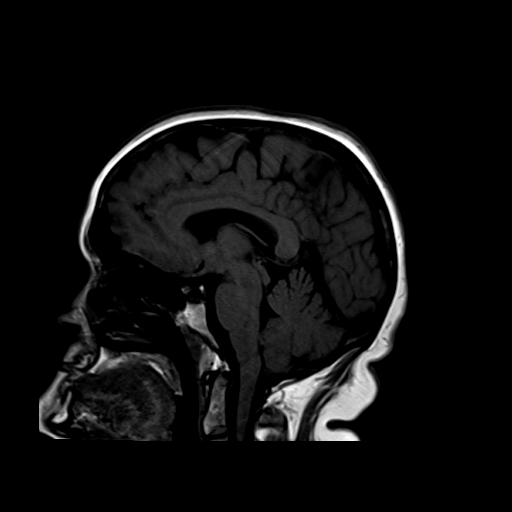

[Series 7: FLAIR · axial · 5.0mm · 0.45mm/px · z∈[-38,+159]mm · 8 of 26 slices shown]
[im 1/26]
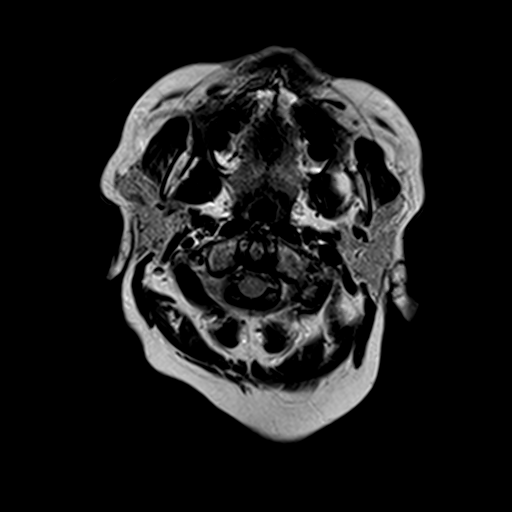
[im 4/26]
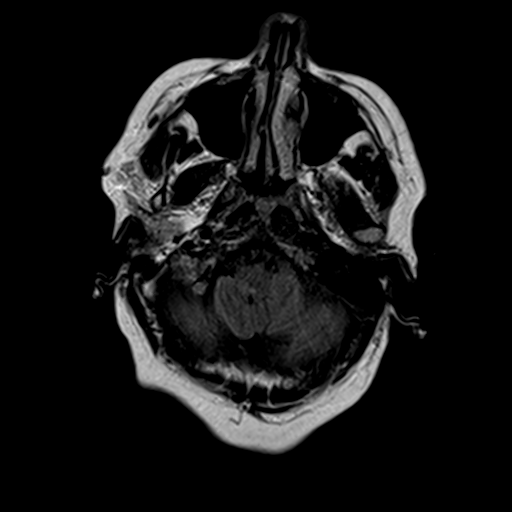
[im 8/26]
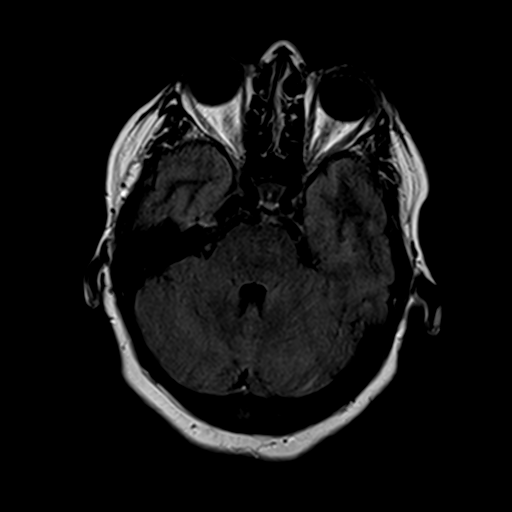
[im 11/26]
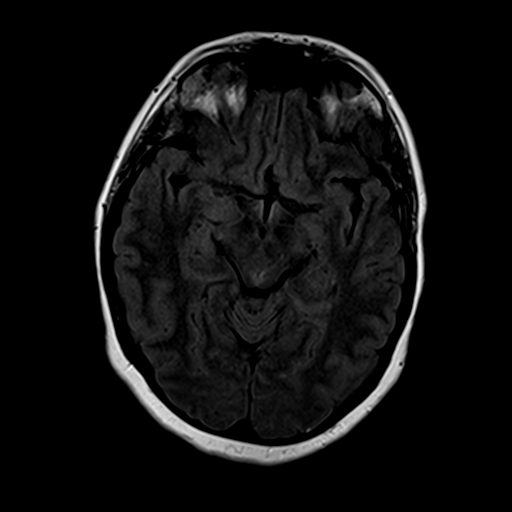
[im 15/26]
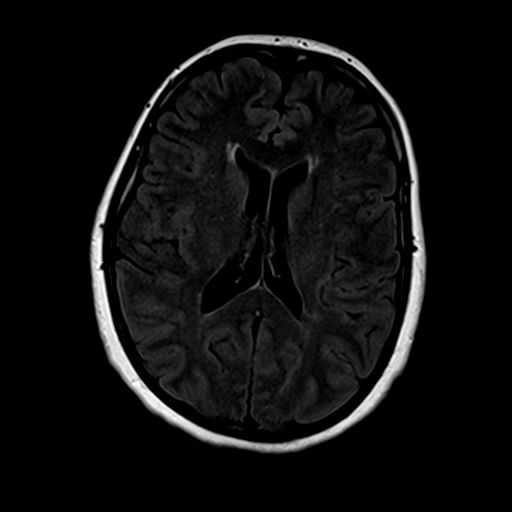
[im 18/26]
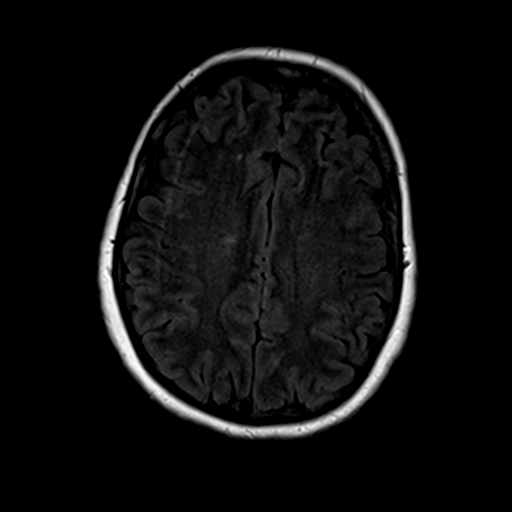
[im 22/26]
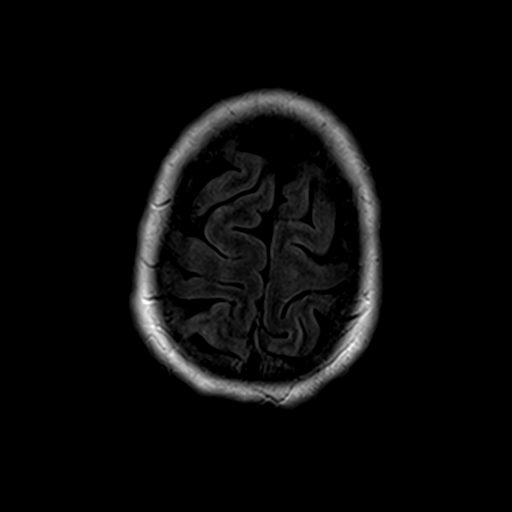
[im 26/26]
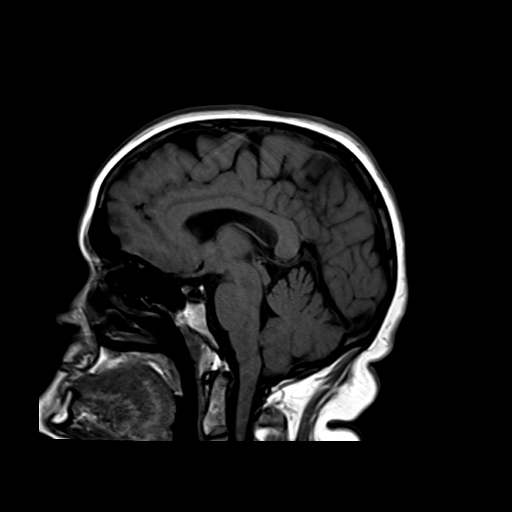

[Series 5002: DWI · axial · 5.0mm · 1.80mm/px · z∈[-38,+162]mm · 8 of 26 slices shown]
[im 1/26]
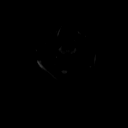
[im 4/26]
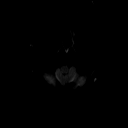
[im 8/26]
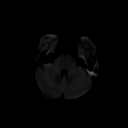
[im 11/26]
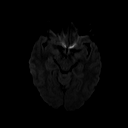
[im 15/26]
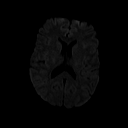
[im 18/26]
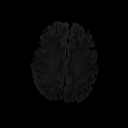
[im 22/26]
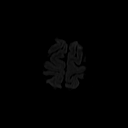
[im 26/26]
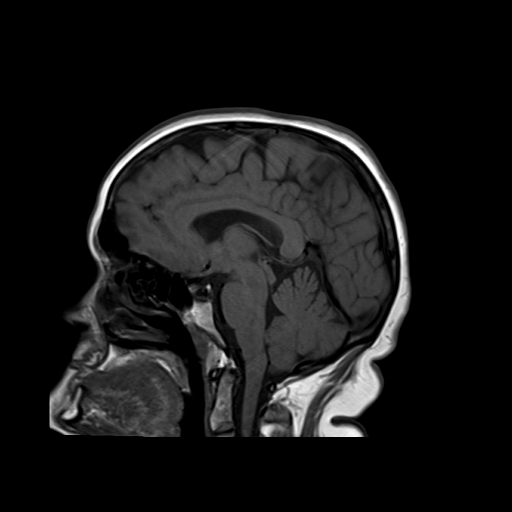

[Series 5005: ADC · axial · 5.0mm · 1.80mm/px · z∈[-38,+159]mm · 8 of 26 slices shown]
[im 1/26]
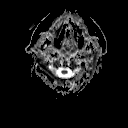
[im 4/26]
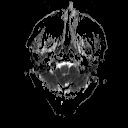
[im 8/26]
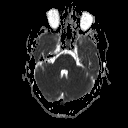
[im 11/26]
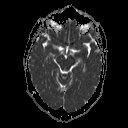
[im 15/26]
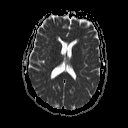
[im 18/26]
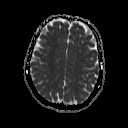
[im 22/26]
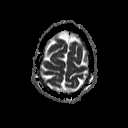
[im 26/26]
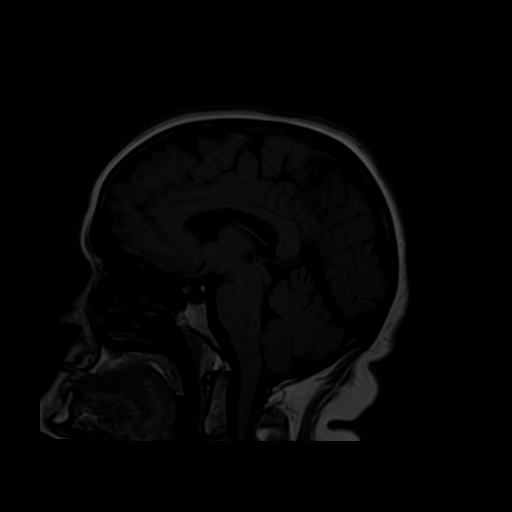

[36 of 48 positions shown; findings below may reference images not displayed]

FINDINGS: Multiplanar, multisequence images brain is obtained.
Diffusion-weighted images reveal no evidence of acute ischemia. White matter
changes suggesting chronic ischemia noted. Posterior fossa including VIII
nerve complexes are normal. Sinuses and orbits are unremarkable.
IMPRESSION: No acute or focal abnormality.

## 2014-01-05 IMAGING — CT CT HEAD WITHOUT CONTRAST
2 series · 15 of 30 positions shown, 19 images · non-contrast
Comparison: none

REASON FOR EXAM: STUTTERING
COMMENTS:   May transport without cardiac monitor

PROCEDURE:     CT  - CT HEAD WITHOUT CONTRAST  - July 22, 2012  [DATE]
RESULT:     Comparison:  None
TECHNIQUE: Multiple axial images from the foramen magnum to the vertex were
obtained without IV contrast.

[Series 2: without · axial · non-contrast · 0.43mm/px · z∈[-157,-37]mm · 13 of 30 slices shown, 17 images]
[im 3/30  brain]
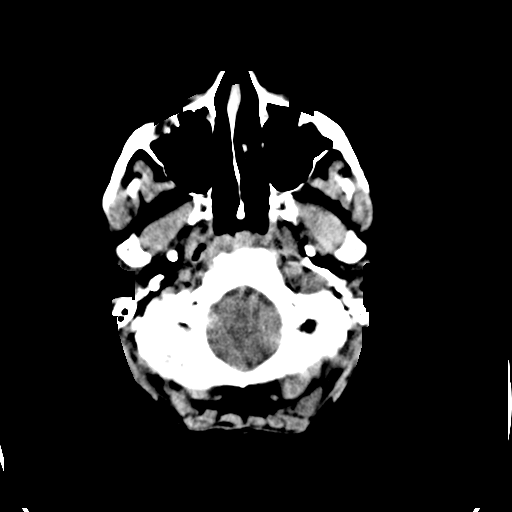
[im 3/30  bone]
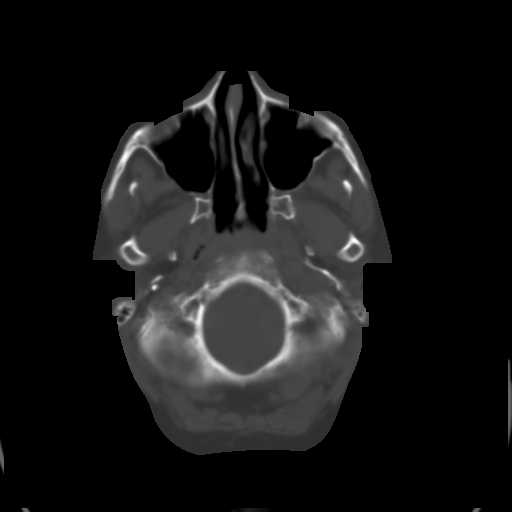
[im 5/30  brain]
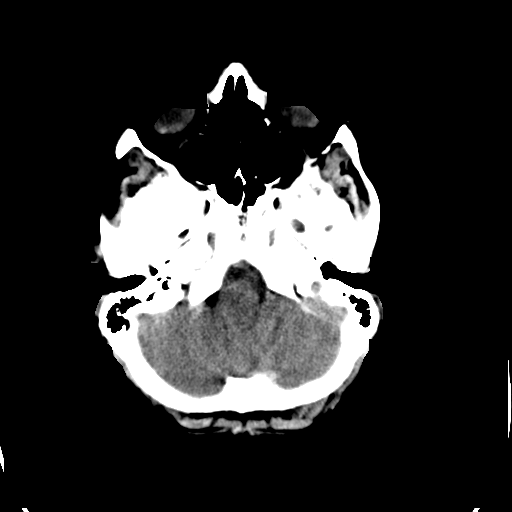
[im 7/30  brain]
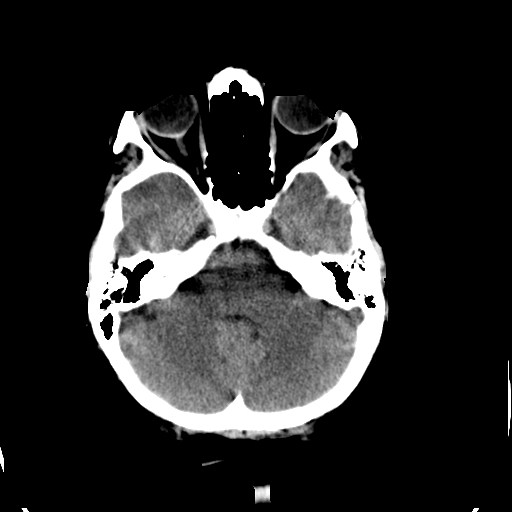
[im 9/30  brain]
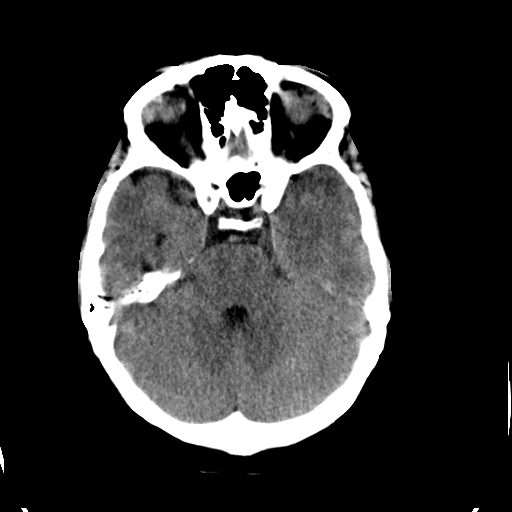
[im 11/30  brain]
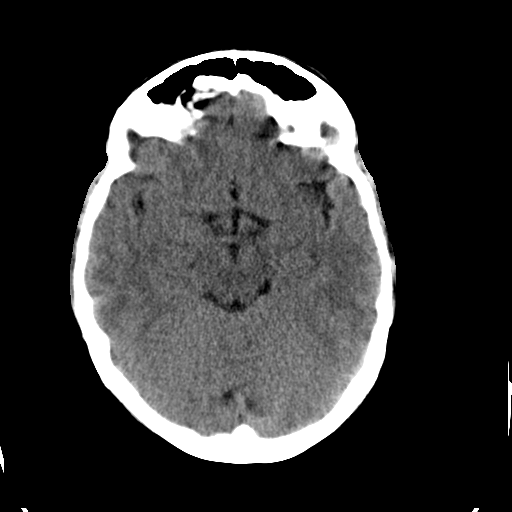
[im 11/30  bone]
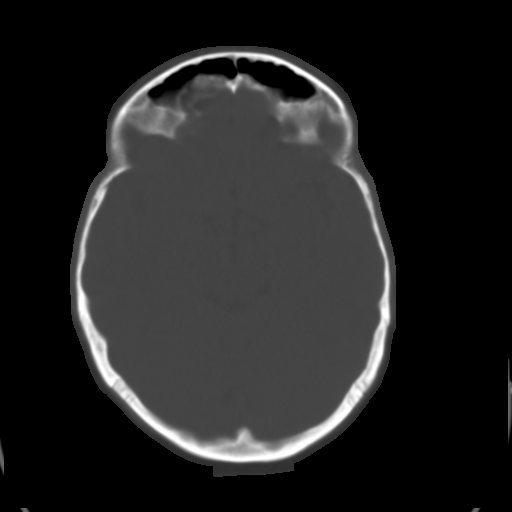
[im 13/30  brain]
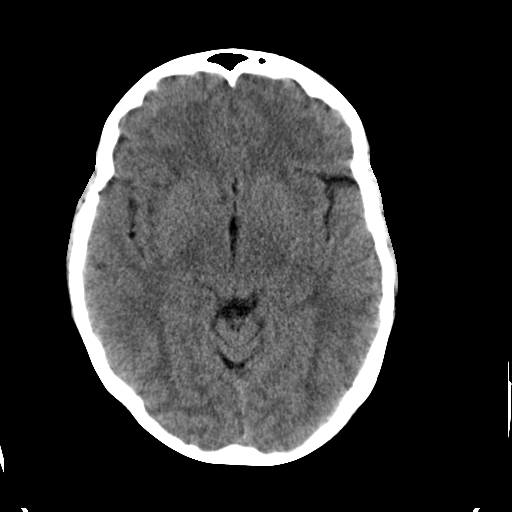
[im 15/30  brain]
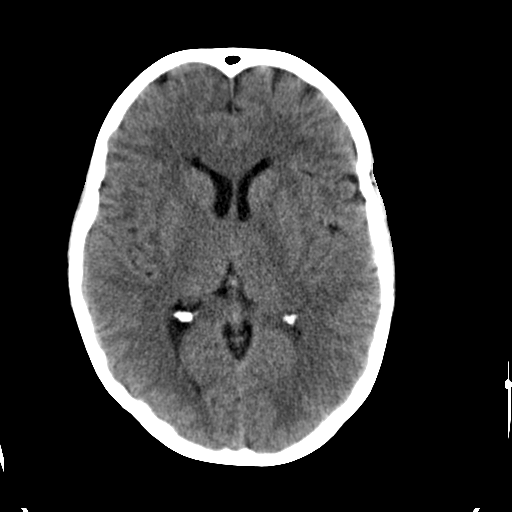
[im 17/30  brain]
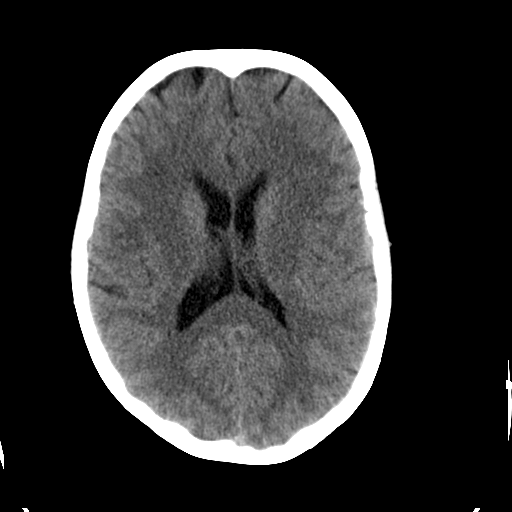
[im 19/30  brain]
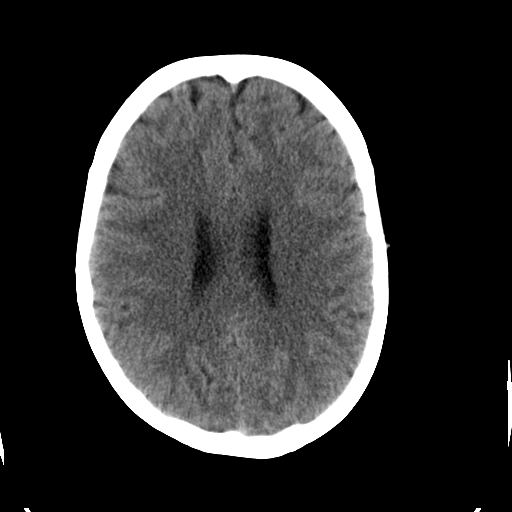
[im 19/30  bone]
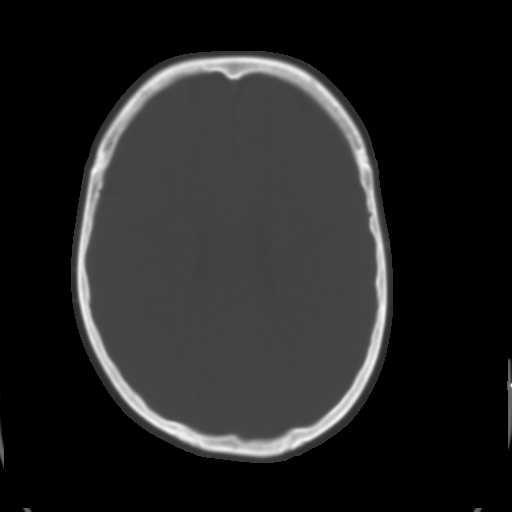
[im 21/30  brain]
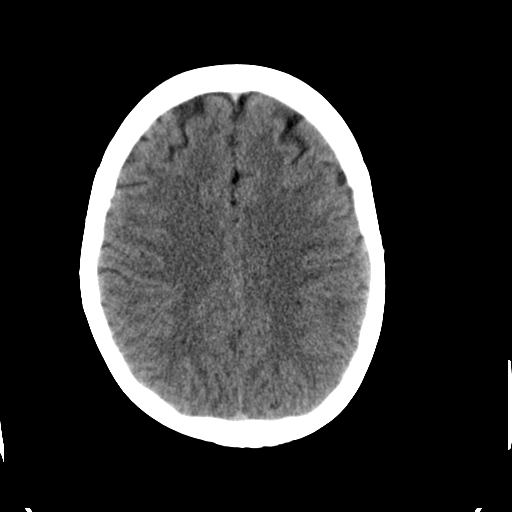
[im 23/30  brain]
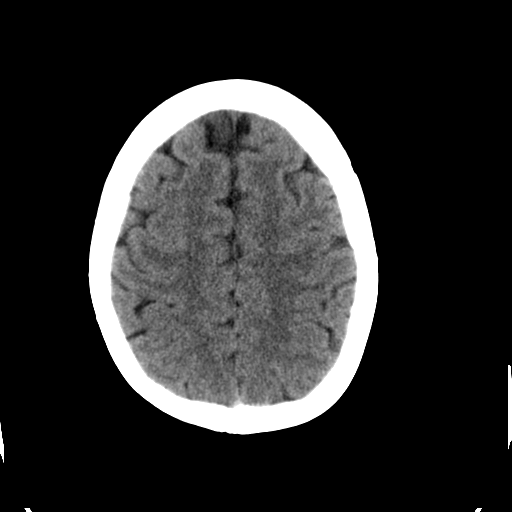
[im 25/30  brain]
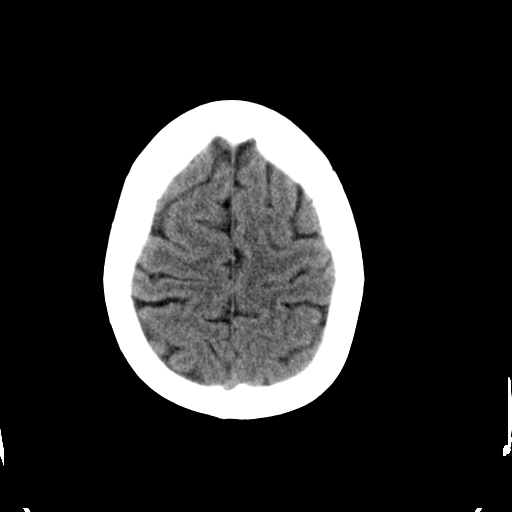
[im 27/30  brain]
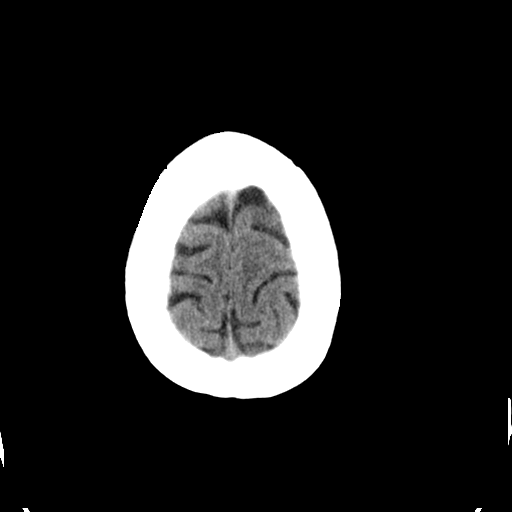
[im 27/30  bone]
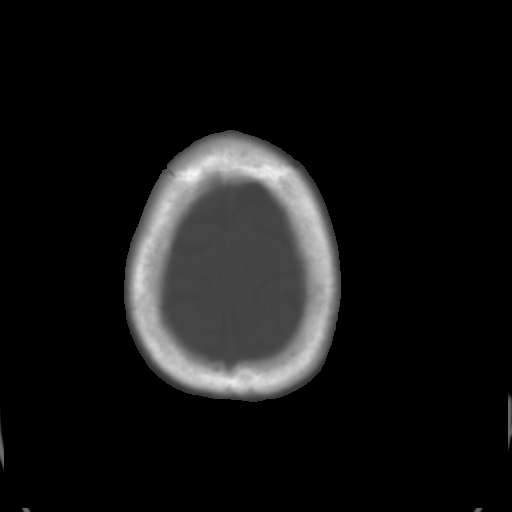

[Series 3: bone · axial · 0.43mm/px · z∈[-157,-137]mm · 2 of 30 slices shown]
[im 3/30  bone]
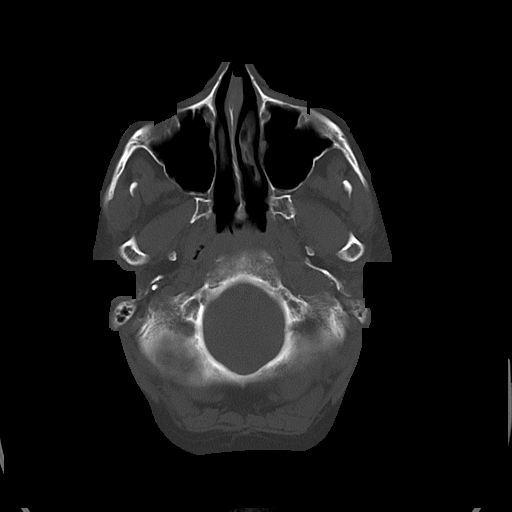
[im 7/30  bone]
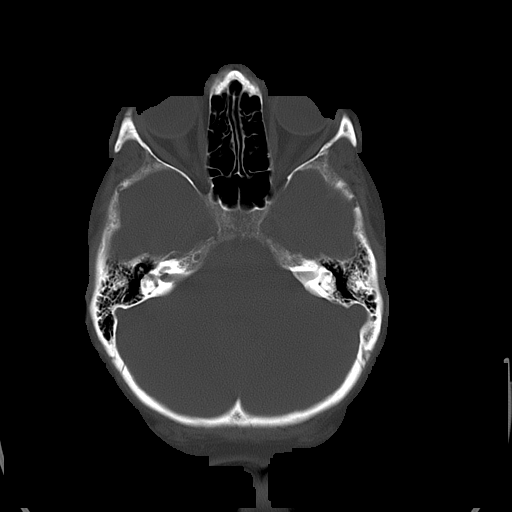

[15 of 30 positions shown; findings below may reference images not displayed]

FINDINGS: There is no evidence of mass effect, midline shift, or extra-axial fluid
collections.  There is no evidence of a space-occupying lesion or
intracranial hemorrhage. There is no evidence of a cortical-based area of
acute infarction. There is subtle subcortical posterior right frontal lobe
white matter low attenuation which may secondary to microangiopathy versus
ischemia versus vasculitis versus other etiologies.

The ventricles and sulci are appropriate for the patient's age. The basal
cisterns are patent.

Visualized portions of the orbits are unremarkable. The visualized portions
of the paranasal sinuses and mastoid air cells are unremarkable.

The osseous structures are unremarkable.
IMPRESSION: There is subtle subcortical posterior right frontal lobe white matter low
attenuation which may secondary to microangiopathy versus ischemia versus
vasculitis versus other etiologies. Recommend further evaluation with MRI of
the brain.

[REDACTED]

## 2014-01-08 ENCOUNTER — Emergency Department: Payer: Self-pay | Admitting: Emergency Medicine

## 2014-01-08 LAB — URINALYSIS, COMPLETE
Bilirubin,UR: NEGATIVE
Glucose,UR: NEGATIVE mg/dL (ref 0–75)
Ketone: NEGATIVE
Nitrite: NEGATIVE
Ph: 6 (ref 4.5–8.0)
Protein: 500
Specific Gravity: 1.035 (ref 1.003–1.030)

## 2014-01-09 DIAGNOSIS — N390 Urinary tract infection, site not specified: Secondary | ICD-10-CM | POA: Insufficient documentation

## 2014-01-09 HISTORY — DX: Urinary tract infection, site not specified: N39.0

## 2014-01-10 LAB — URINE CULTURE

## 2014-01-16 ENCOUNTER — Emergency Department: Payer: Self-pay | Admitting: Emergency Medicine

## 2014-02-10 ENCOUNTER — Emergency Department: Payer: Self-pay | Admitting: Emergency Medicine

## 2014-02-15 IMAGING — CR DG KNEE COMPLETE 4+V*R*
1 series · 5 of 5 positions shown · non-contrast
Comparison: none

REASON FOR EXAM: right knee pain
COMMENTS:

PROCEDURE:     DXR - DXR KNEE RT COMP WITH OBLIQUES  - September 01, 2012 [DATE]
RESULT:     Comparison: 10/10/2008

[Series 1: t knee ap right · 0.14mm/px · 5 of 5 slices shown]
[im 1/5]
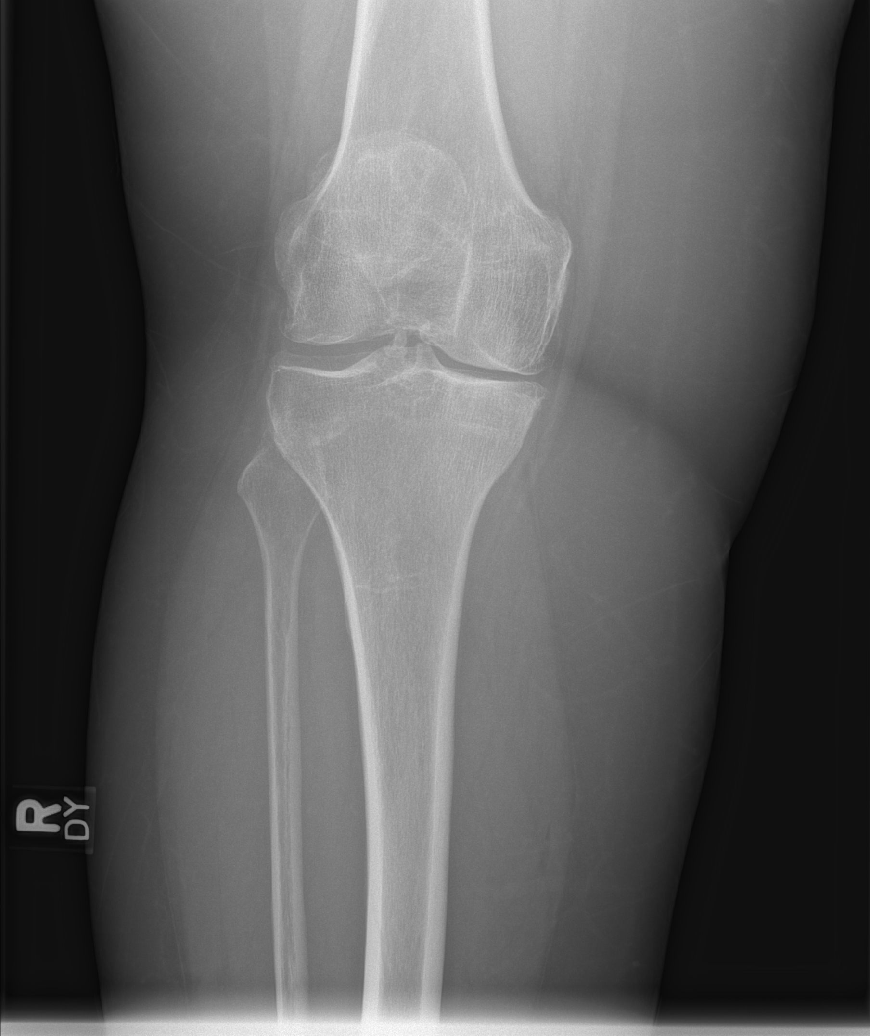
[im 2/5]
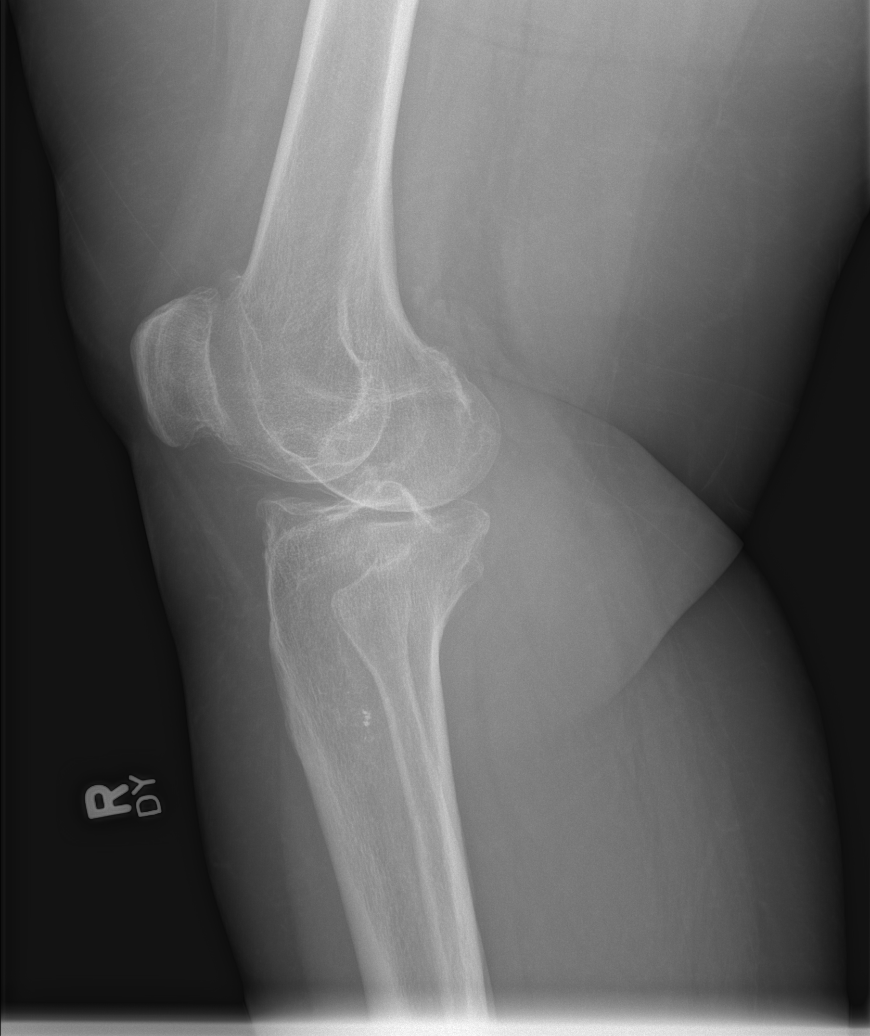
[im 3/5]
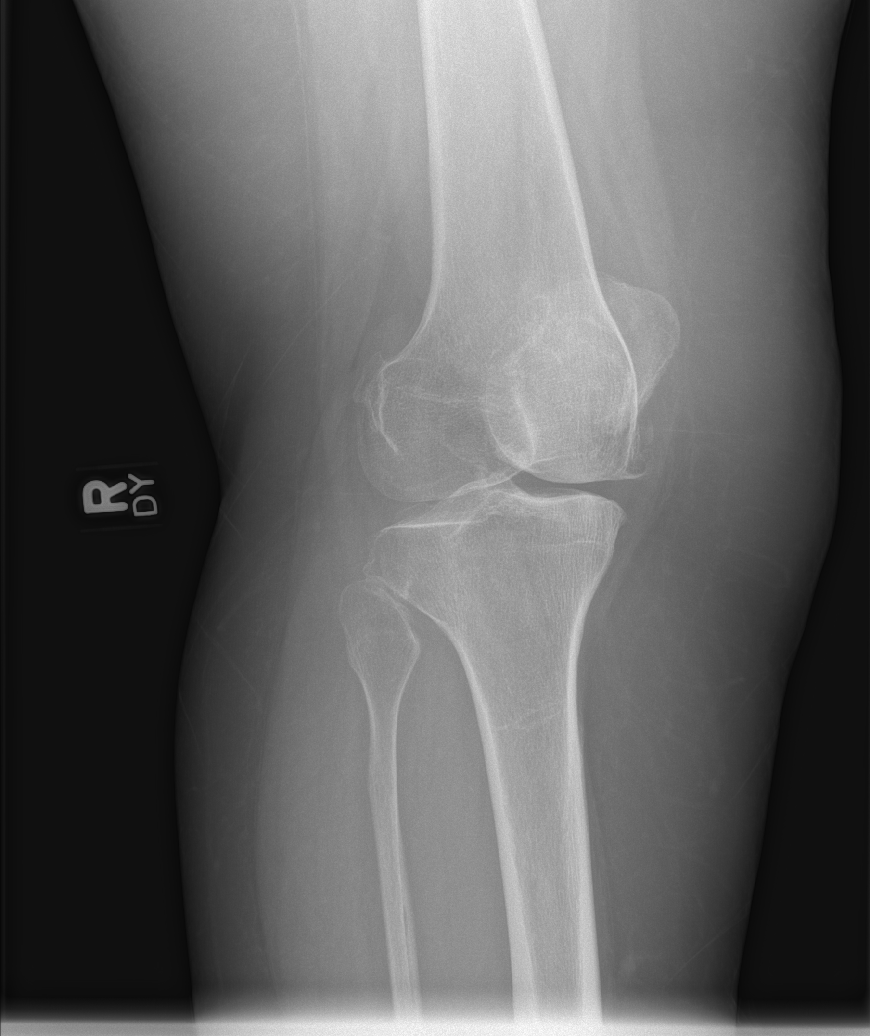
[im 4/5]
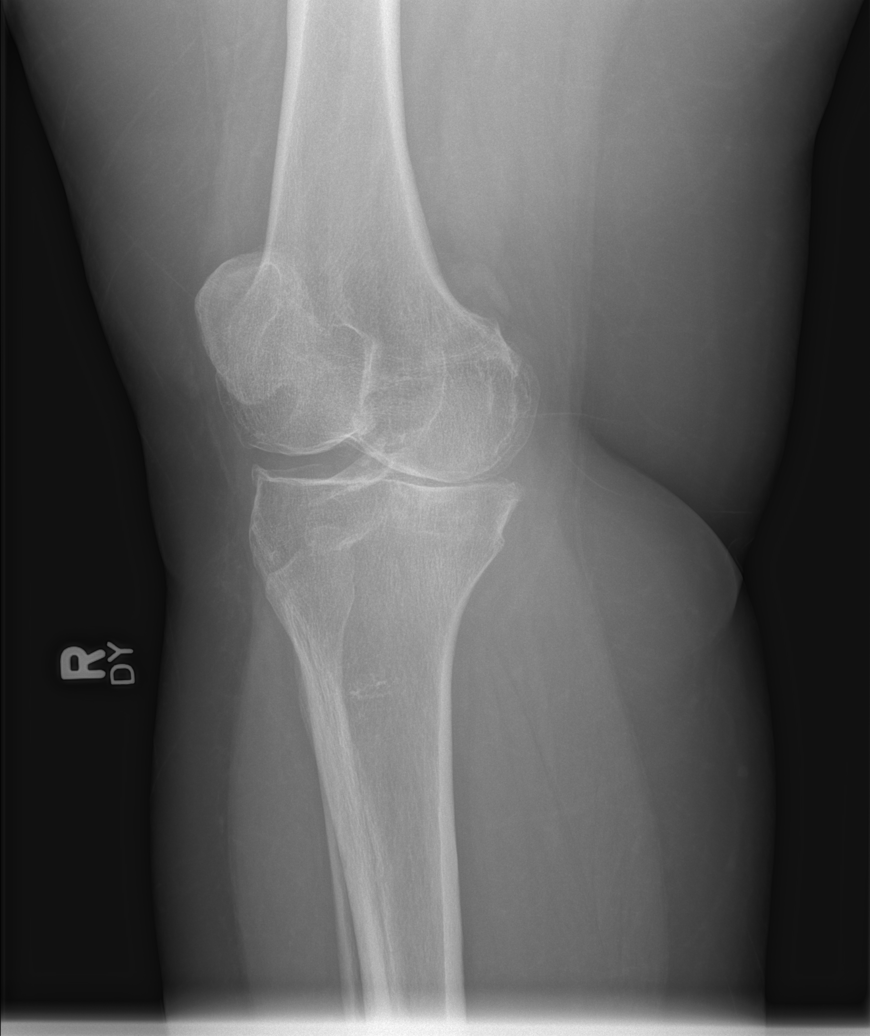
[im 5/5]
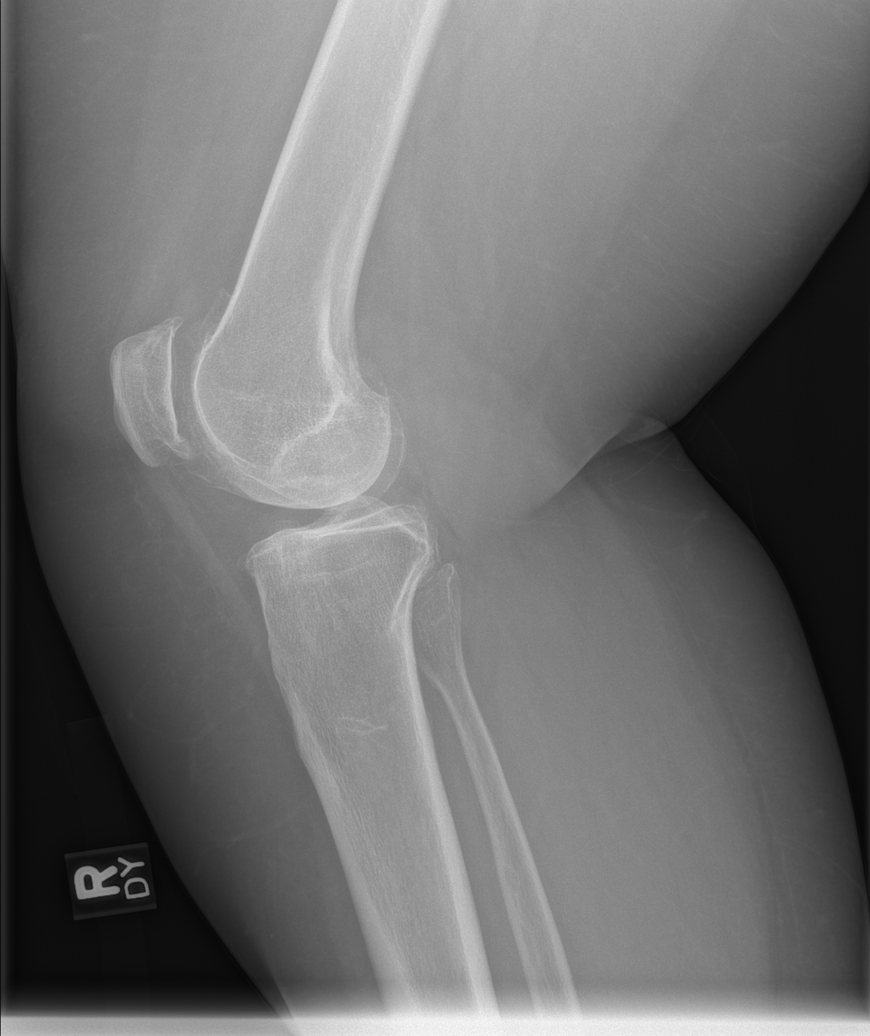

[5 of 5 positions shown; findings below may reference images not displayed]

FINDINGS: 4 views of the right knee demonstrates no acute fracture or dislocation.
There is no significant joint effusion. There are  degenerative changes
probably involving the medial tibiofemoral compartment. In
IMPRESSION: No acute osseous injury of the right knee.

[REDACTED]

## 2014-03-09 IMAGING — CR DG KNEE COMPLETE 4+V*R*
1 series · 4 of 4 positions shown · non-contrast
Comparison: none

REASON FOR EXAM: atraumatic pain
COMMENTS:

PROCEDURE:     DXR - DXR KNEE RT COMP WITH OBLIQUES  - September 23, 2012  [DATE]
RESULT:     Comparison:  None

[Series 1: t knee ap right · 0.14mm/px · 4 of 4 slices shown]
[im 1/4]
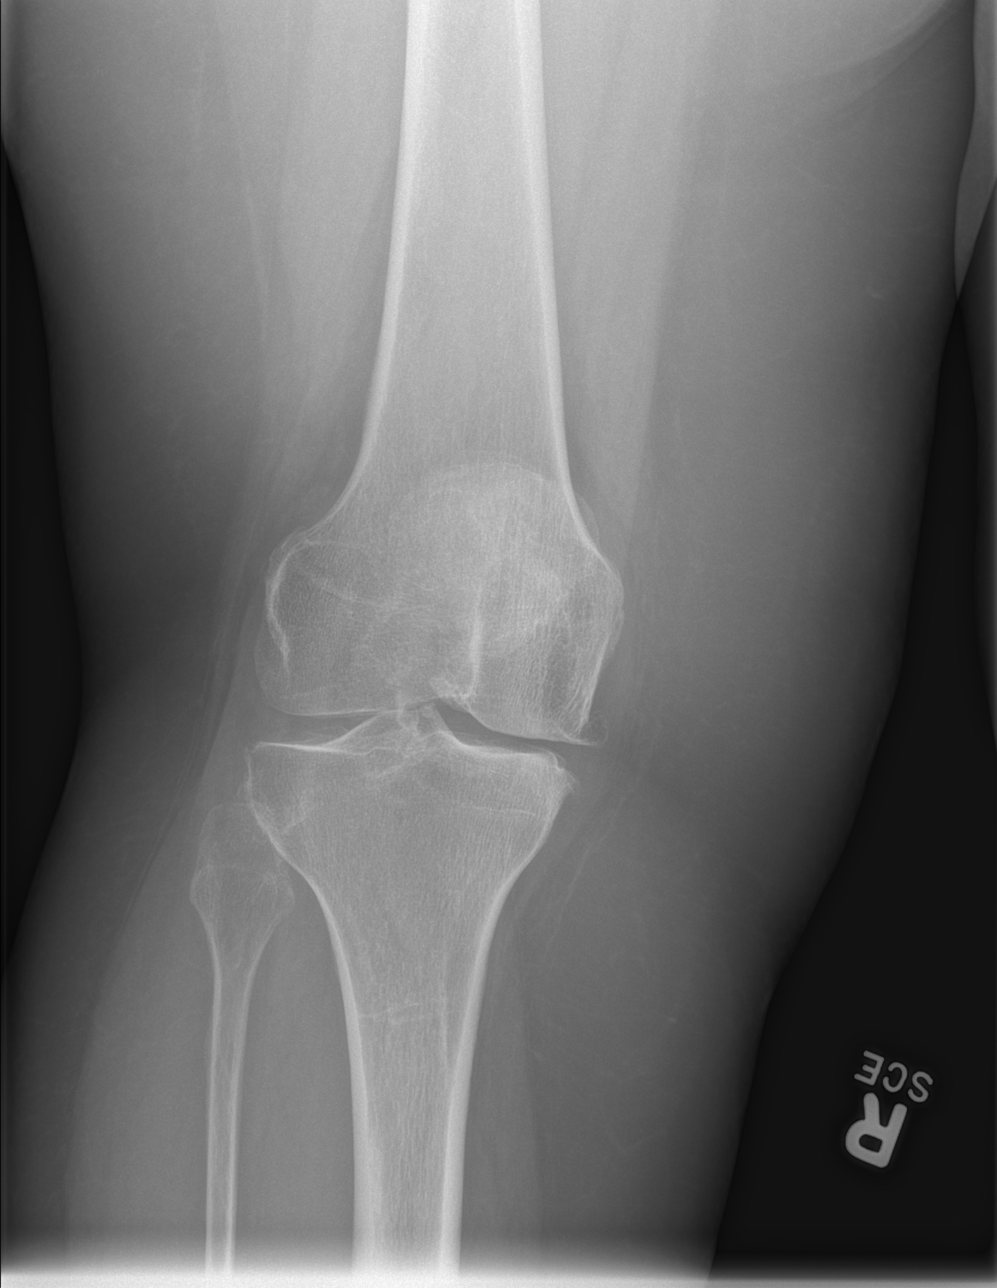
[im 2/4]
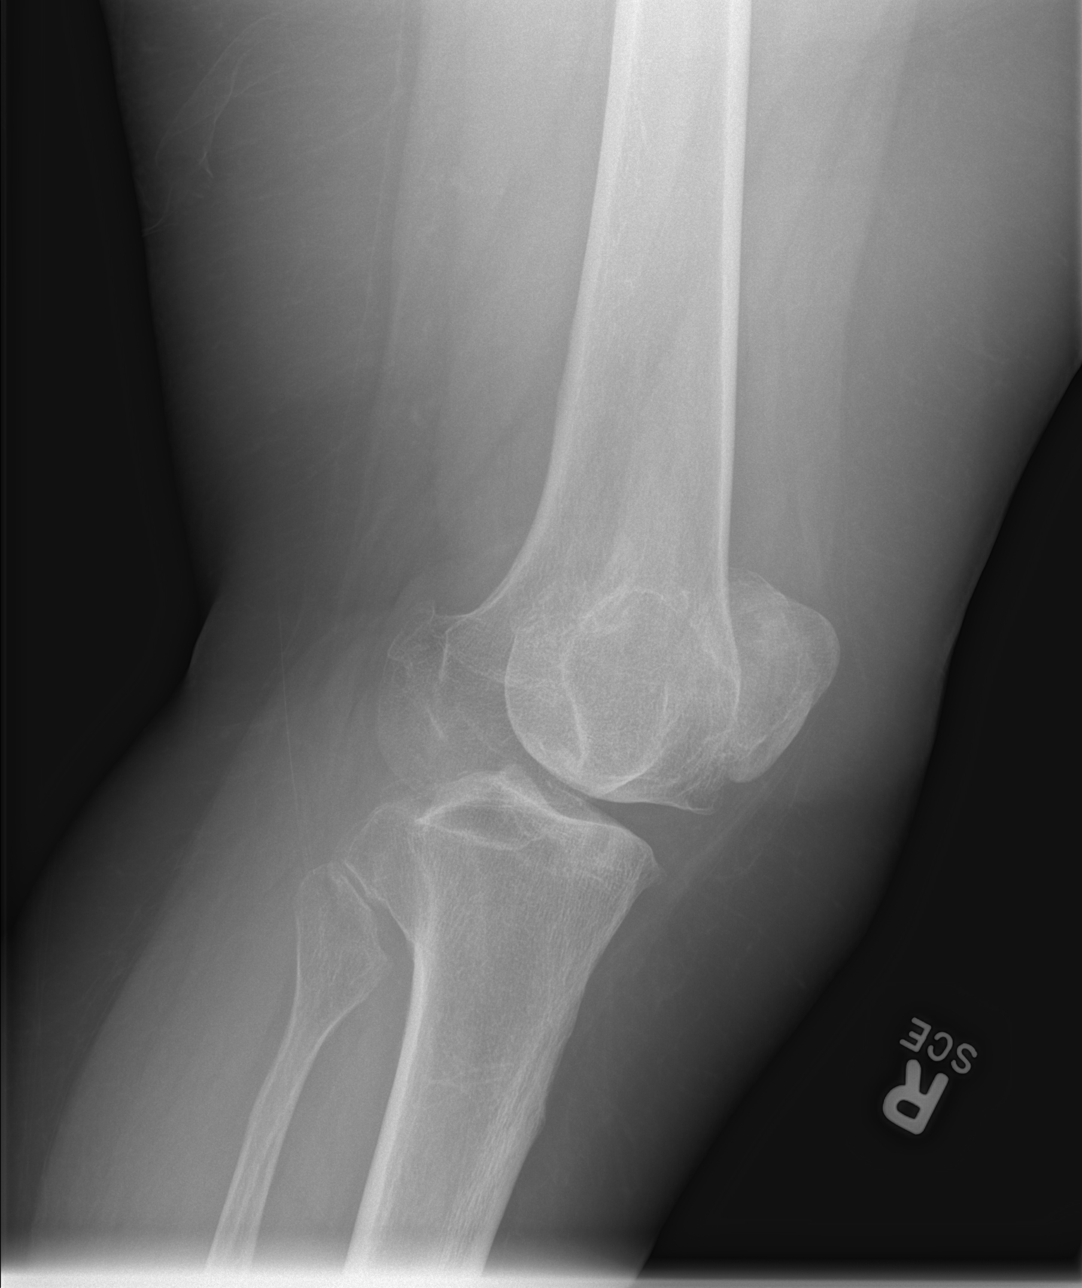
[im 3/4]
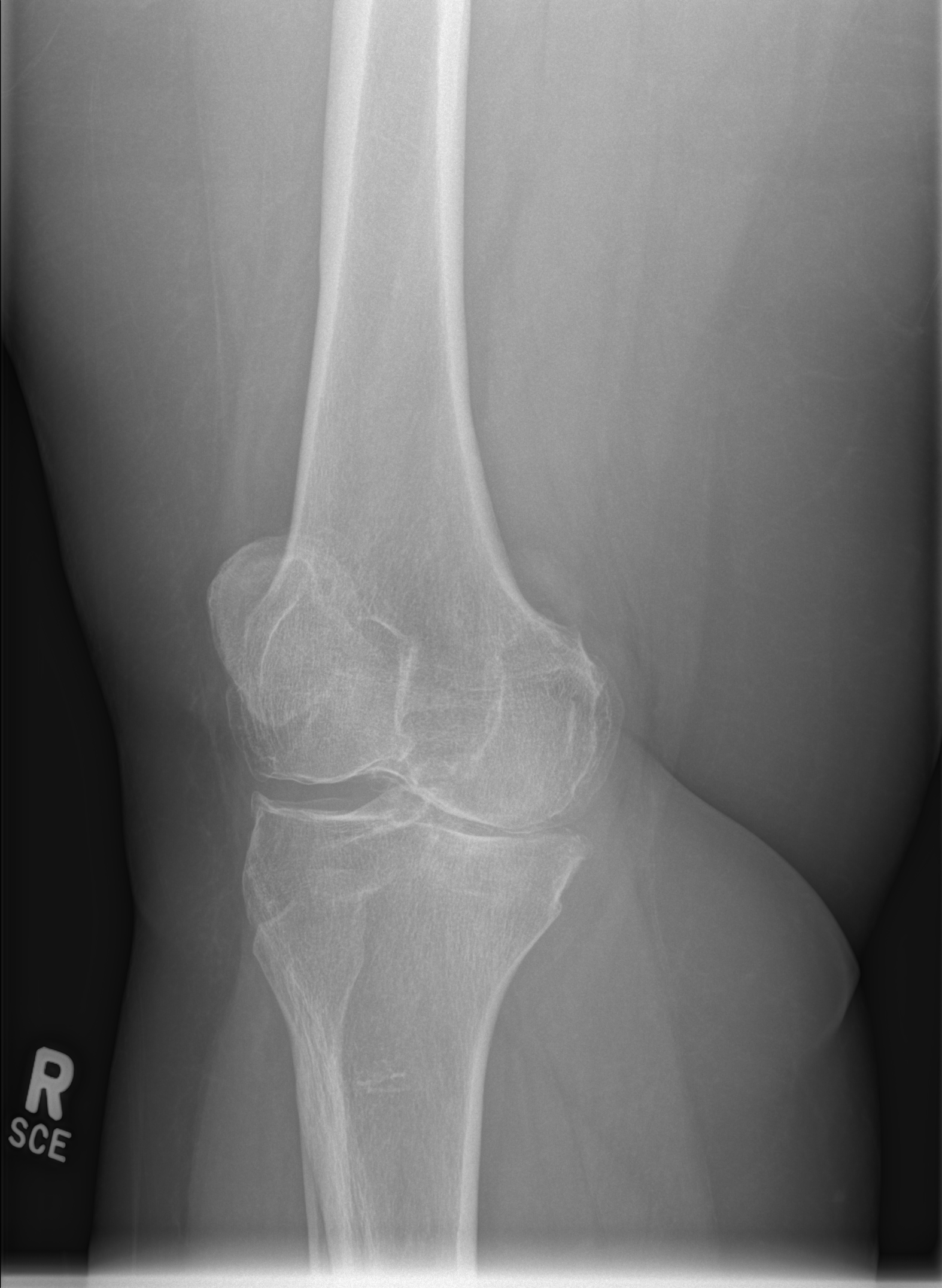
[im 4/4]
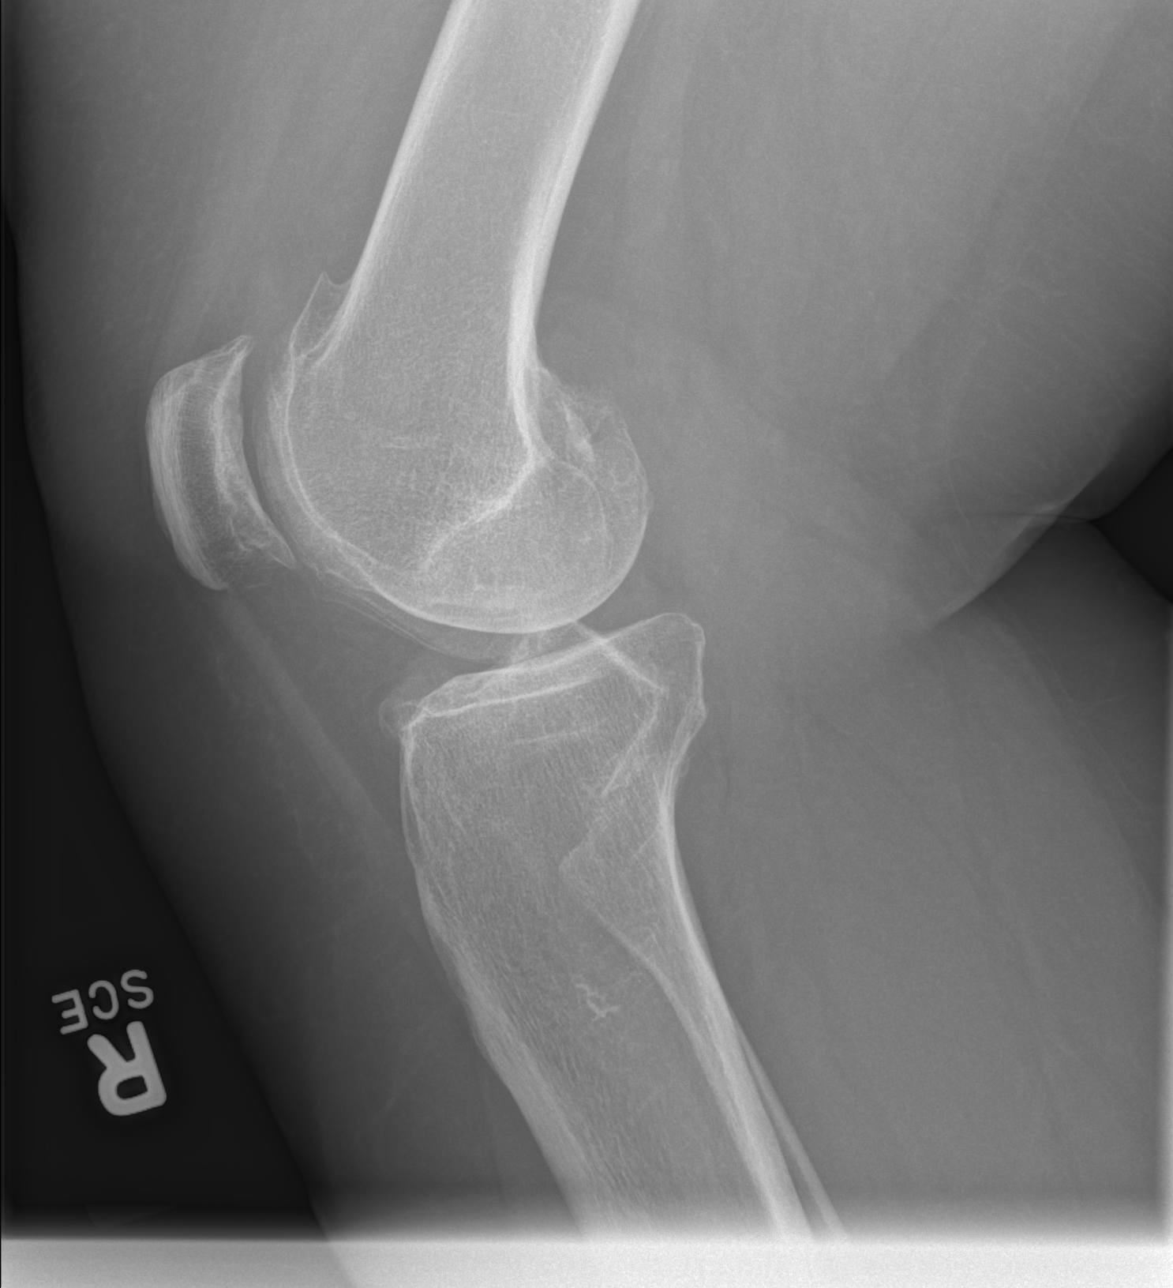

[4 of 4 positions shown; findings below may reference images not displayed]

FINDINGS: AP and lateral views of the right knee demonstrates no acute fracture or
dislocation. There is no significant joint effusion. There is generalized
osteopenia. There is degenerative joint disease most severe in the medial
tibiofemoral compartment and patellofemoral compartment with joint space
narrowing and marginal osteophytes.
IMPRESSION: No acute osseous injury of the right knee.

[REDACTED]

## 2014-04-13 ENCOUNTER — Emergency Department: Payer: Self-pay | Admitting: Emergency Medicine

## 2014-04-15 ENCOUNTER — Ambulatory Visit: Payer: Self-pay

## 2014-05-11 ENCOUNTER — Ambulatory Visit: Payer: Self-pay | Admitting: Family Medicine

## 2014-06-07 IMAGING — CR DG CHEST 2V
1 series · 2 of 2 positions shown · non-contrast
Comparison: none

REASON FOR EXAM: Chest Pain
COMMENTS:

[Series 1: pa · 0.17mm/px · 2 of 2 slices shown]
[im 1/2]
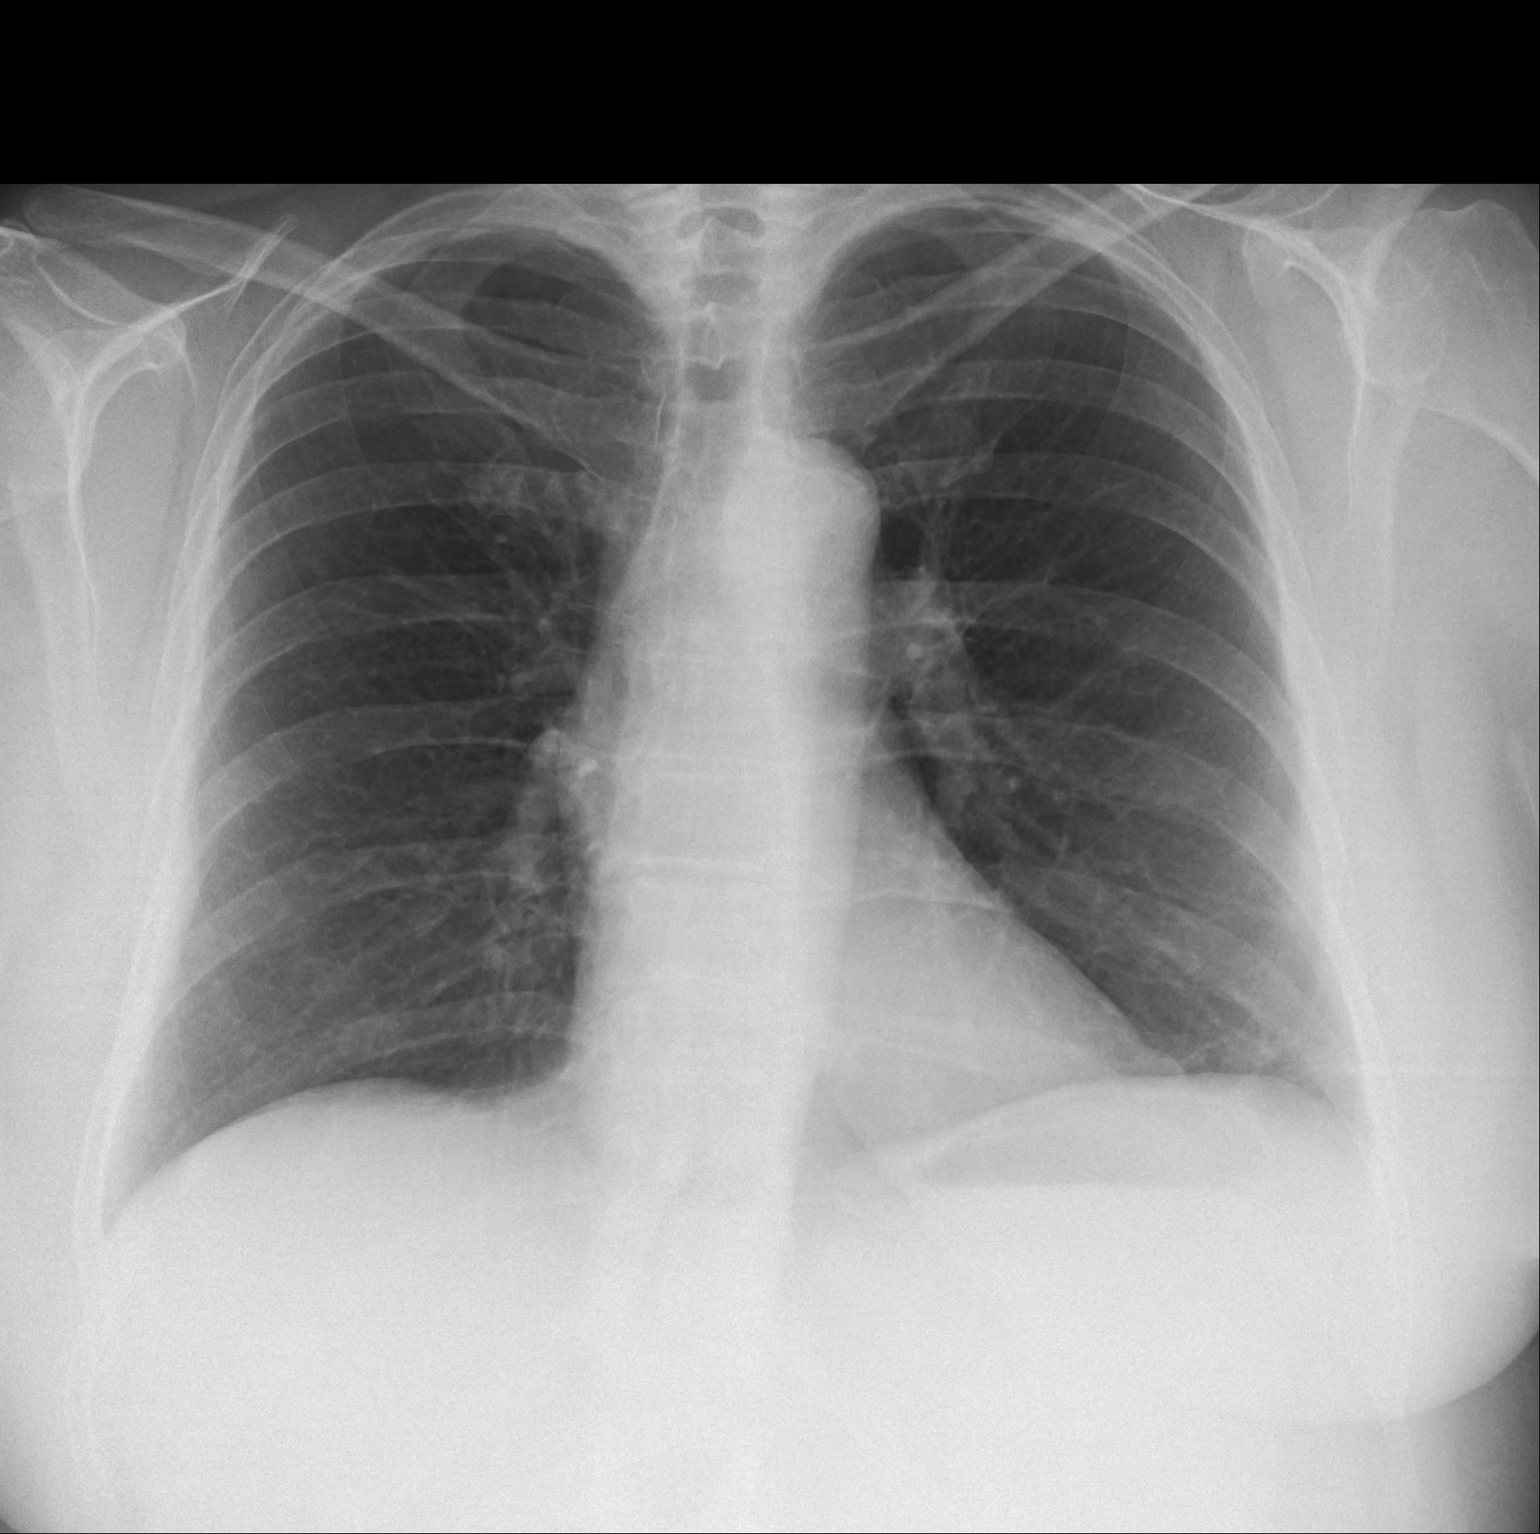
[im 2/2]
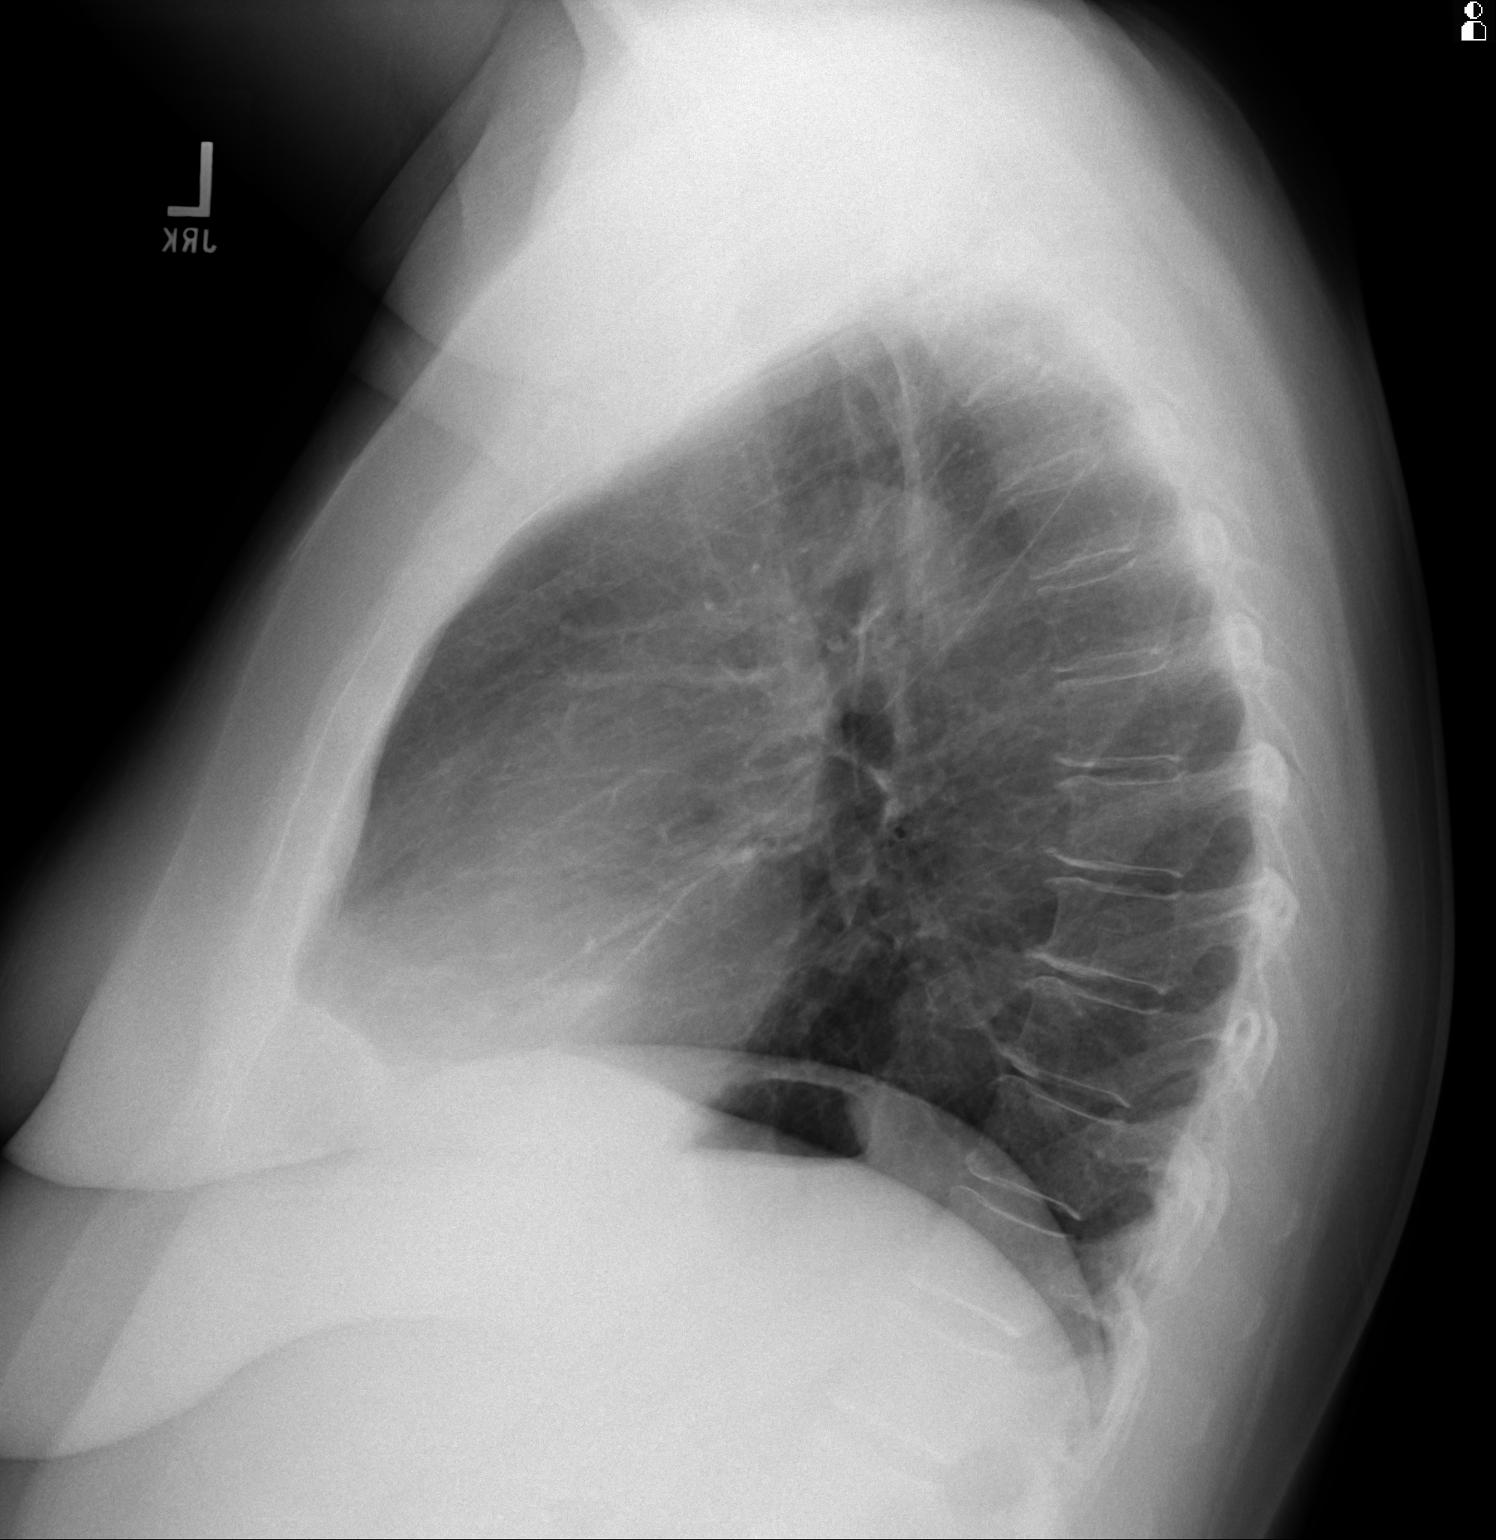

[2 of 2 positions shown; findings below may reference images not displayed]

PROCEDURE:     DXR - DXR CHEST PA (OR AP) AND LATERAL  - December 22, 2012 [DATE]

RESULT:     Comparison is made to the previous exam of 23 July, 2012. The
lungs are clear. The heart and pulmonary vessels are normal. The bony and
mediastinal structures are unremarkable. There is no effusion. There is no
pneumothorax or evidence of congestive failure.
IMPRESSION: No acute cardiopulmonary disease.

[REDACTED]

## 2014-06-08 IMAGING — CT CT ABD-PELV W/ CM
1 of 2 series · 15 of 32 positions shown, 19 images · IV contrast (isovue)
Comparison: None

REASON FOR EXAM: (1) vomiting and back pain eval; (2) vomiting and back
pain eval
COMMENTS:

PROCEDURE:     CT  - CT ABDOMEN / PELVIS  W  - December 23, 2012  [DATE]
RESULT:     History: Vomiting, back
TECHNIQUE: Multiple axial images of the abdomen and pelvis were performed
from the lung bases to the pubic symphysis, without p.o. contrast and with
100 ml of Isovue 300 intravenous contrast.

[Series 2: 3mm soft tissue · axial · 0.78mm/px · z∈[-500,-62]mm · 15 of 160 slices shown, 19 images]
[im 7/160  soft-tissue]
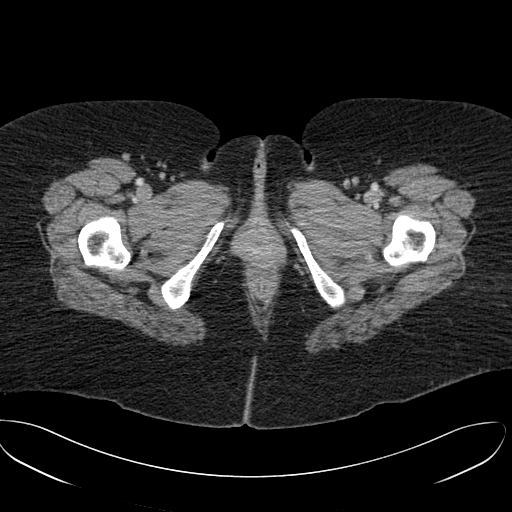
[im 7/160  bone]
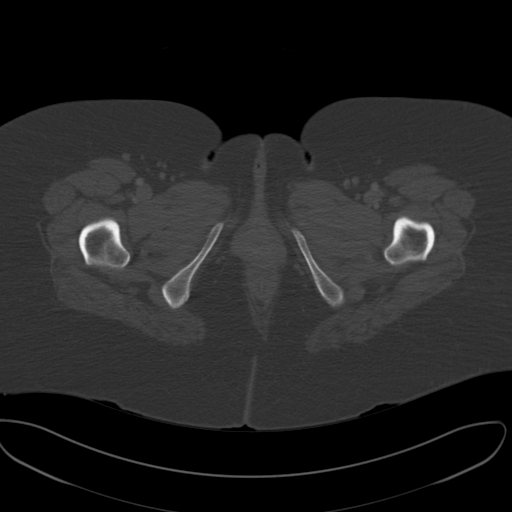
[im 20/160  soft-tissue]
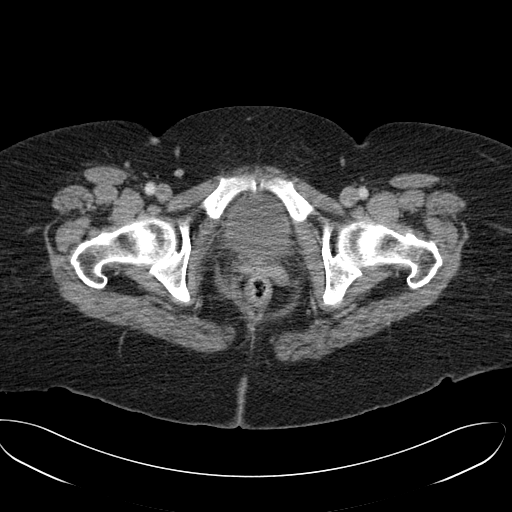
[im 34/160  soft-tissue]
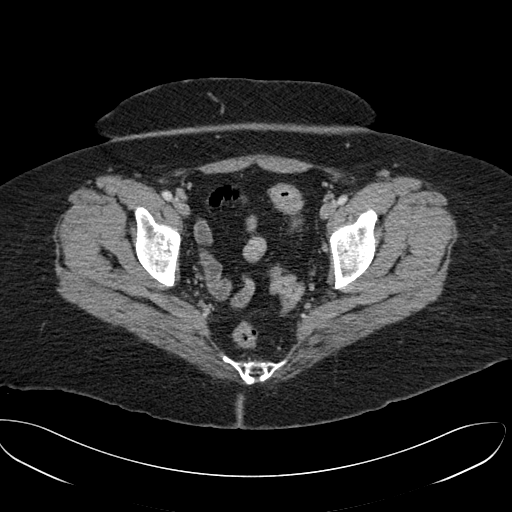
[im 47/160  soft-tissue]
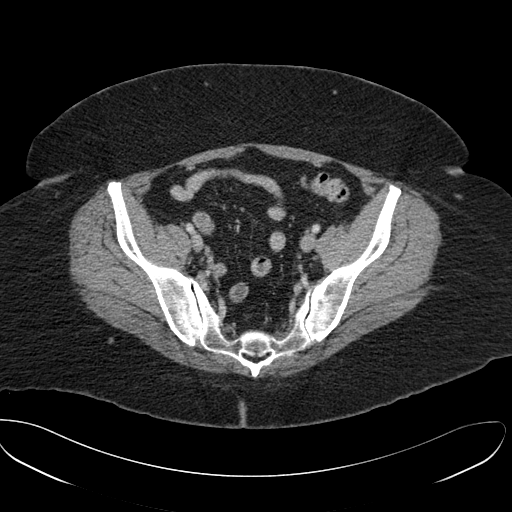
[im 54/160  soft-tissue]
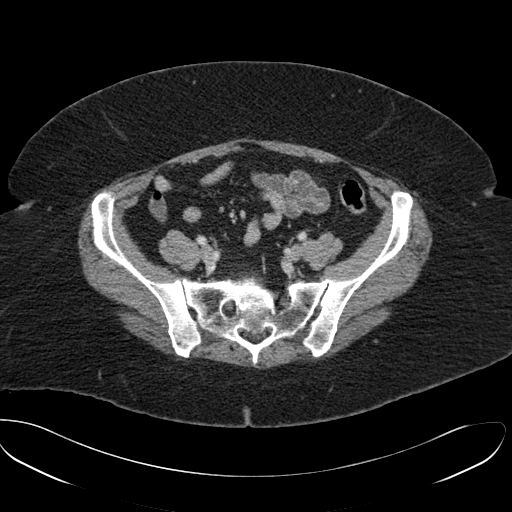
[im 67/160  soft-tissue]
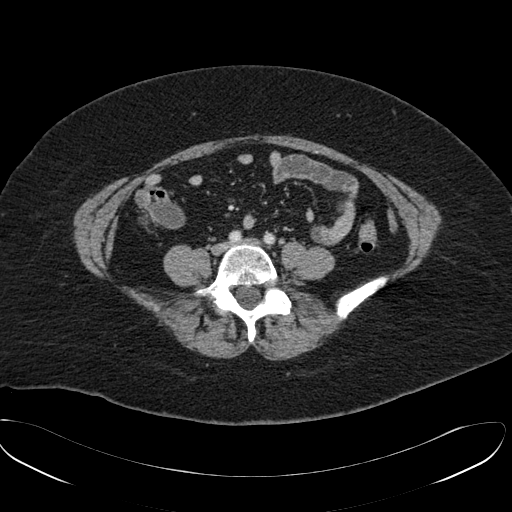
[im 80/160  soft-tissue]
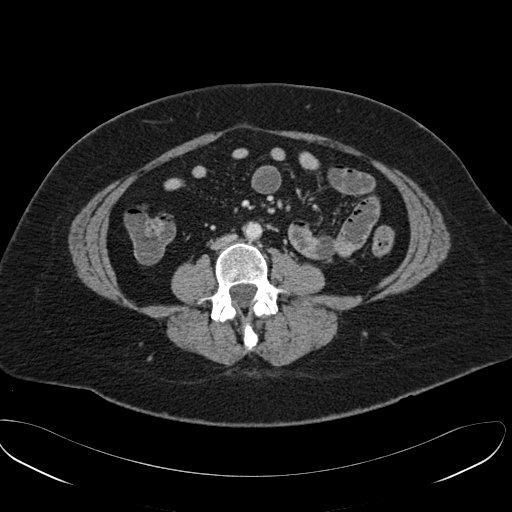
[im 93/160  soft-tissue]
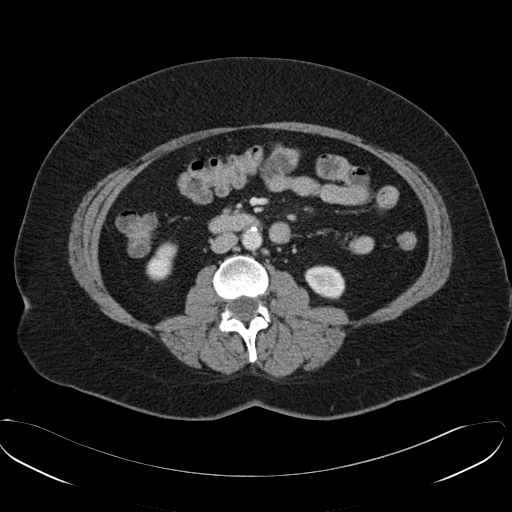
[im 107/160  soft-tissue]
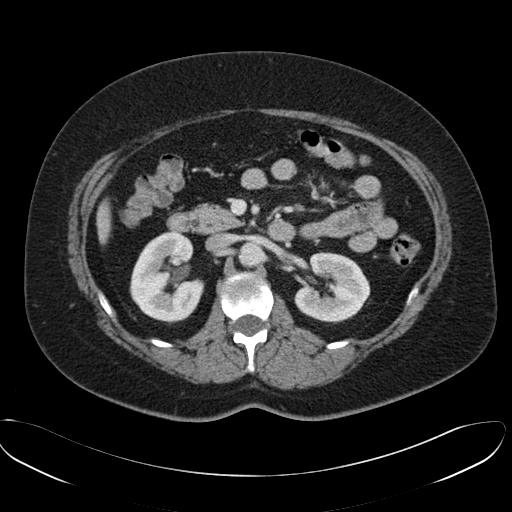
[im 107/160  bone]
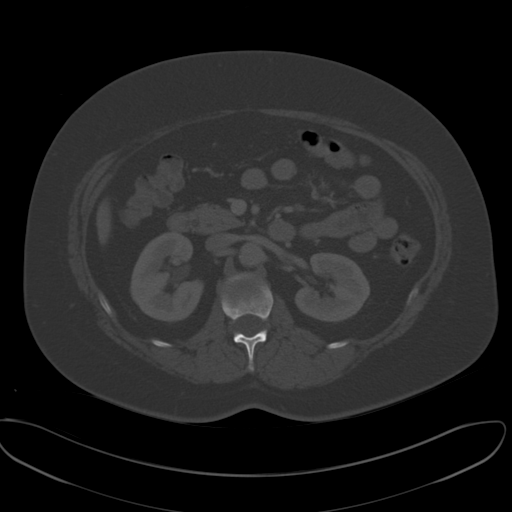
[im 113/160  soft-tissue]
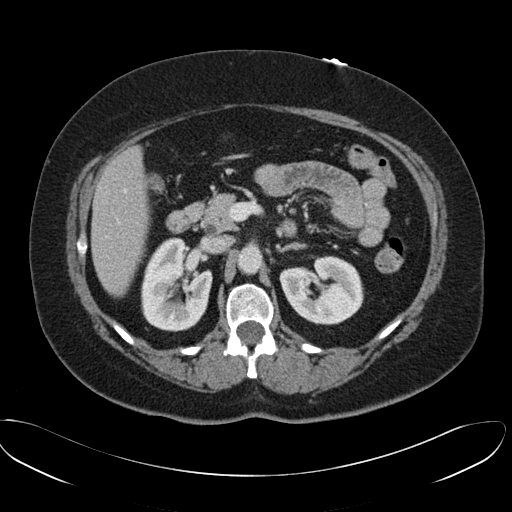
[im 126/160  soft-tissue]
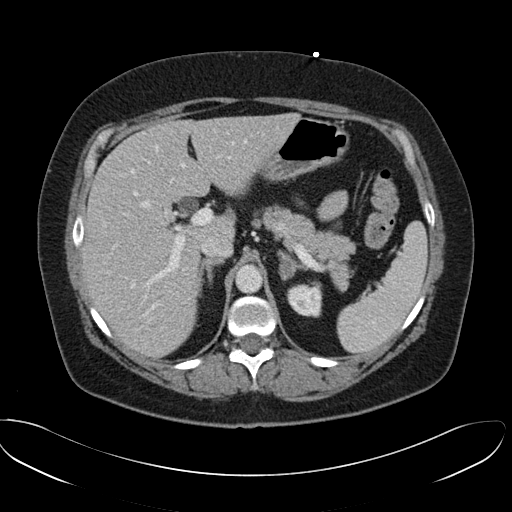
[im 133/160  lung]
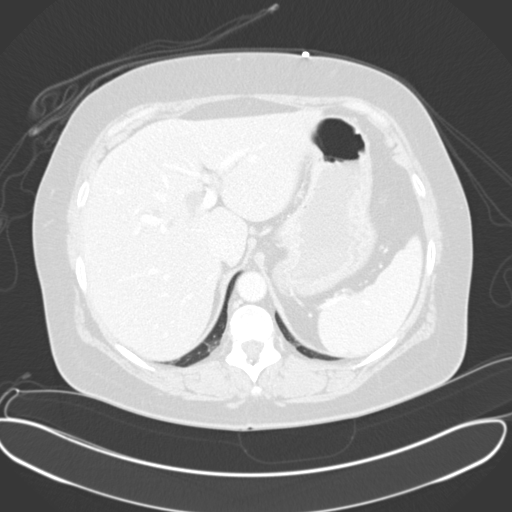
[im 140/160  soft-tissue]
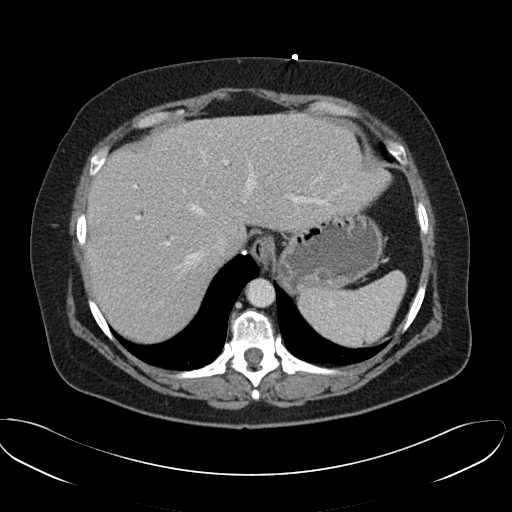
[im 140/160  lung]
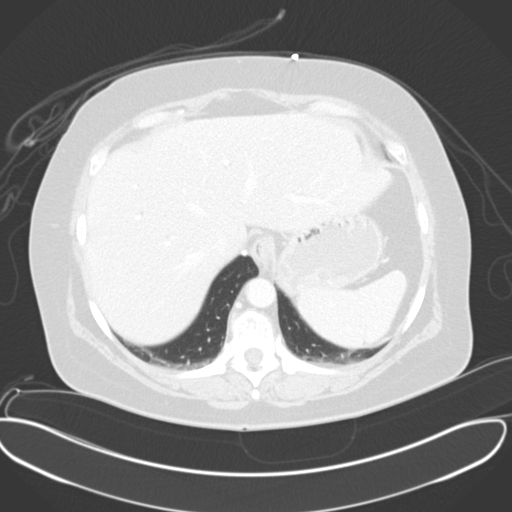
[im 146/160  lung]
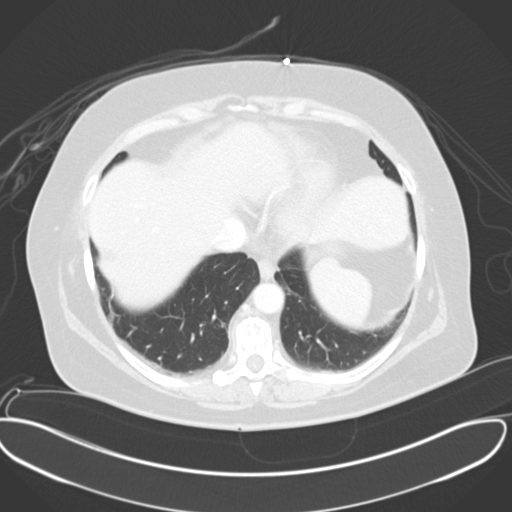
[im 153/160  soft-tissue]
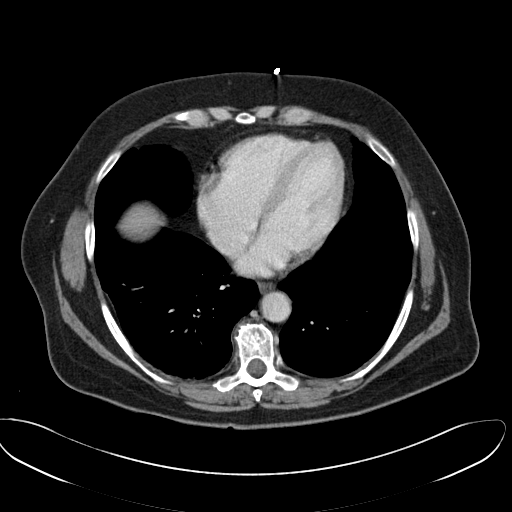
[im 153/160  lung]
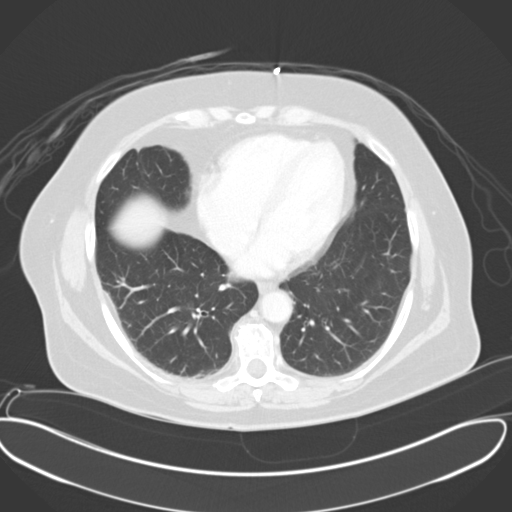

[15 of 32 positions shown; findings below may reference images not displayed]

FINDINGS: The lung bases are clear. There is no pneumothorax. The heart size is
normal.

The liver demonstrates no focal abnormality. There is no intrahepatic or
extrahepatic biliary ductal dilatation. The gallbladder is unremarkable. The
spleen demonstrates no focal abnormality. The kidneys, right adrenal gland,
and pancreas are normal. There is a 16mm left adrenal nodule of
indeterminate etiology. The bladder is unremarkable.

The stomach, duodenum, small intestine, and large intestine demonstrate no
dilatation.  There is no pneumoperitoneum, pneumatosis, or portal venous
gas. There is no abdominal or pelvic free fluid. There is no
lymphadenopathy.

The abdominal aorta is normal in caliber .

The osseous structures are unremarkable.
IMPRESSION: 1. No acute abdominal or pelvic pathology.

2. Left adrenal nodule. Recommend further evaluation with MRI for better
characterization.

[REDACTED]

## 2014-06-28 ENCOUNTER — Emergency Department: Payer: Self-pay | Admitting: Emergency Medicine

## 2014-07-06 ENCOUNTER — Emergency Department: Payer: Self-pay | Admitting: Emergency Medicine

## 2014-08-27 ENCOUNTER — Emergency Department: Payer: Self-pay | Admitting: Emergency Medicine

## 2014-08-27 LAB — BASIC METABOLIC PANEL
Anion Gap: 6 — ABNORMAL LOW (ref 7–16)
BUN: 16 mg/dL (ref 7–18)
Calcium, Total: 8.6 mg/dL (ref 8.5–10.1)
Chloride: 110 mmol/L — ABNORMAL HIGH (ref 98–107)
Co2: 26 mmol/L (ref 21–32)
Creatinine: 0.95 mg/dL (ref 0.60–1.30)
EGFR (African American): >60
EGFR (Non-African Amer.): >60
Glucose: 111 mg/dL — ABNORMAL HIGH (ref 65–99)
Osmolality: 285 (ref 275–301)
Potassium: 4.3 mmol/L (ref 3.5–5.1)
Sodium: 142 mmol/L (ref 136–145)

## 2014-08-27 LAB — CBC WITH DIFFERENTIAL/PLATELET
Basophil #: 0 10*3/uL (ref 0.0–0.1)
Basophil %: 0.3 %
Eosinophil #: 0 10*3/uL (ref 0.0–0.7)
Eosinophil %: 0.3 %
HCT: 42.5 % (ref 35.0–47.0)
HGB: 13.8 g/dL (ref 12.0–16.0)
Lymphocyte #: 1.7 10*3/uL (ref 1.0–3.6)
Lymphocyte %: 23.2 %
MCH: 29.8 pg (ref 26.0–34.0)
MCHC: 32.6 g/dL (ref 32.0–36.0)
MCV: 91 fL (ref 80–100)
Monocyte #: 0.4 x10 3/mm (ref 0.2–0.9)
Monocyte %: 5.2 %
Neutrophil #: 5.3 10*3/uL (ref 1.4–6.5)
Neutrophil %: 71 %
Platelet: 287 10*3/uL (ref 150–440)
RBC: 4.65 10*6/uL (ref 3.80–5.20)
RDW: 13.9 % (ref 11.5–14.5)
WBC: 7.4 10*3/uL (ref 3.6–11.0)

## 2014-08-27 LAB — TROPONIN I: Troponin-I: <0.02 ng/mL

## 2014-09-27 IMAGING — CR DG FEMUR 2V*R*
1 series · 4 of 4 positions shown · non-contrast
Comparison: none

REASON FOR EXAM: pain for 2 months  no hx of injury   flex 40
COMMENTS:

PROCEDURE:     DXR - DXR FEMUR RIGHT  - April 13, 2013 [DATE]
RESULT:     There is no evidence of fracture, dislocation, or malalignment.
There is peripheral osteophytosis involving the femoral condyles and
undersurface of the patella.

[Series 1: t femur proximal ap right · 0.14mm/px · 4 of 4 slices shown]
[im 1/4]
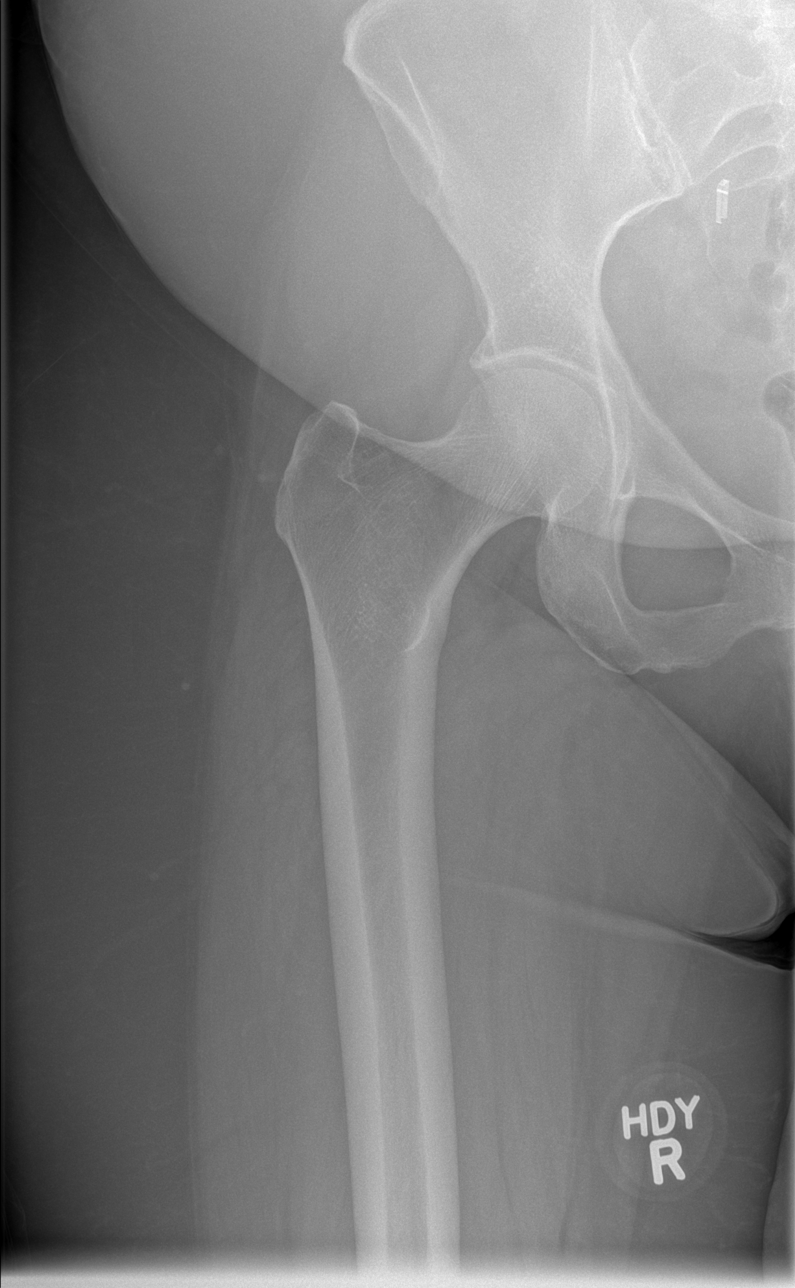
[im 2/4]
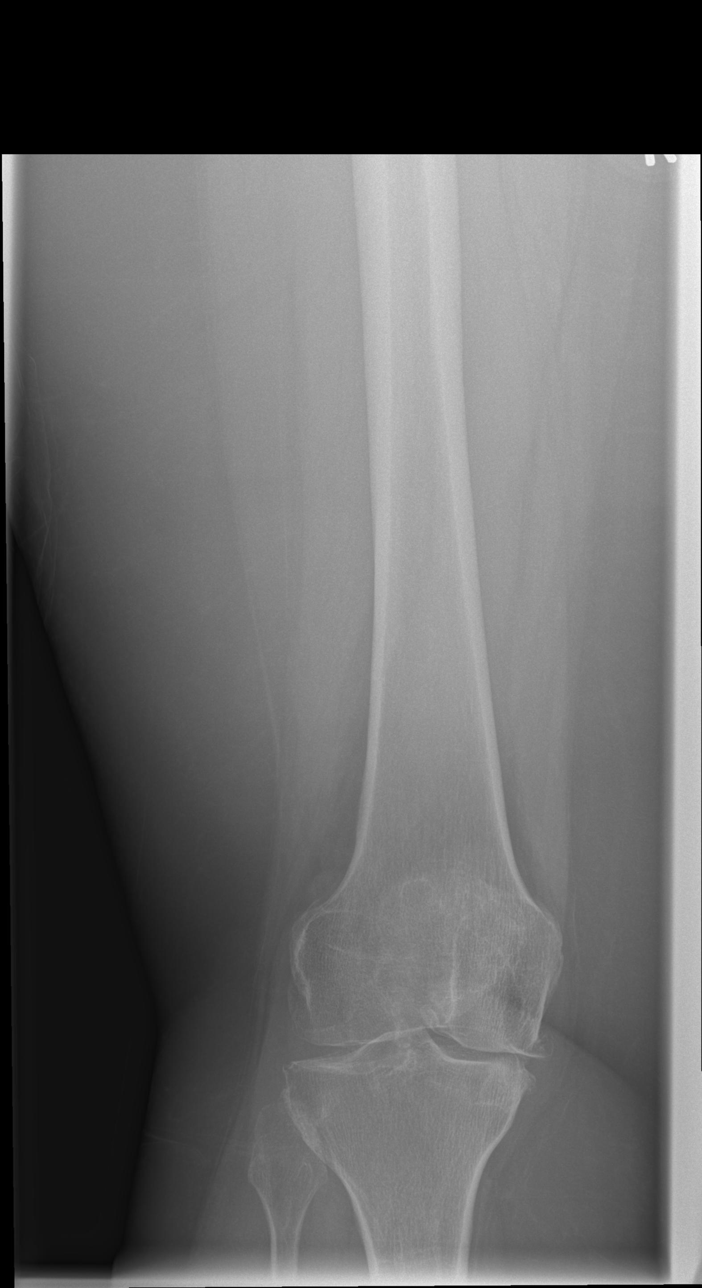
[im 3/4]
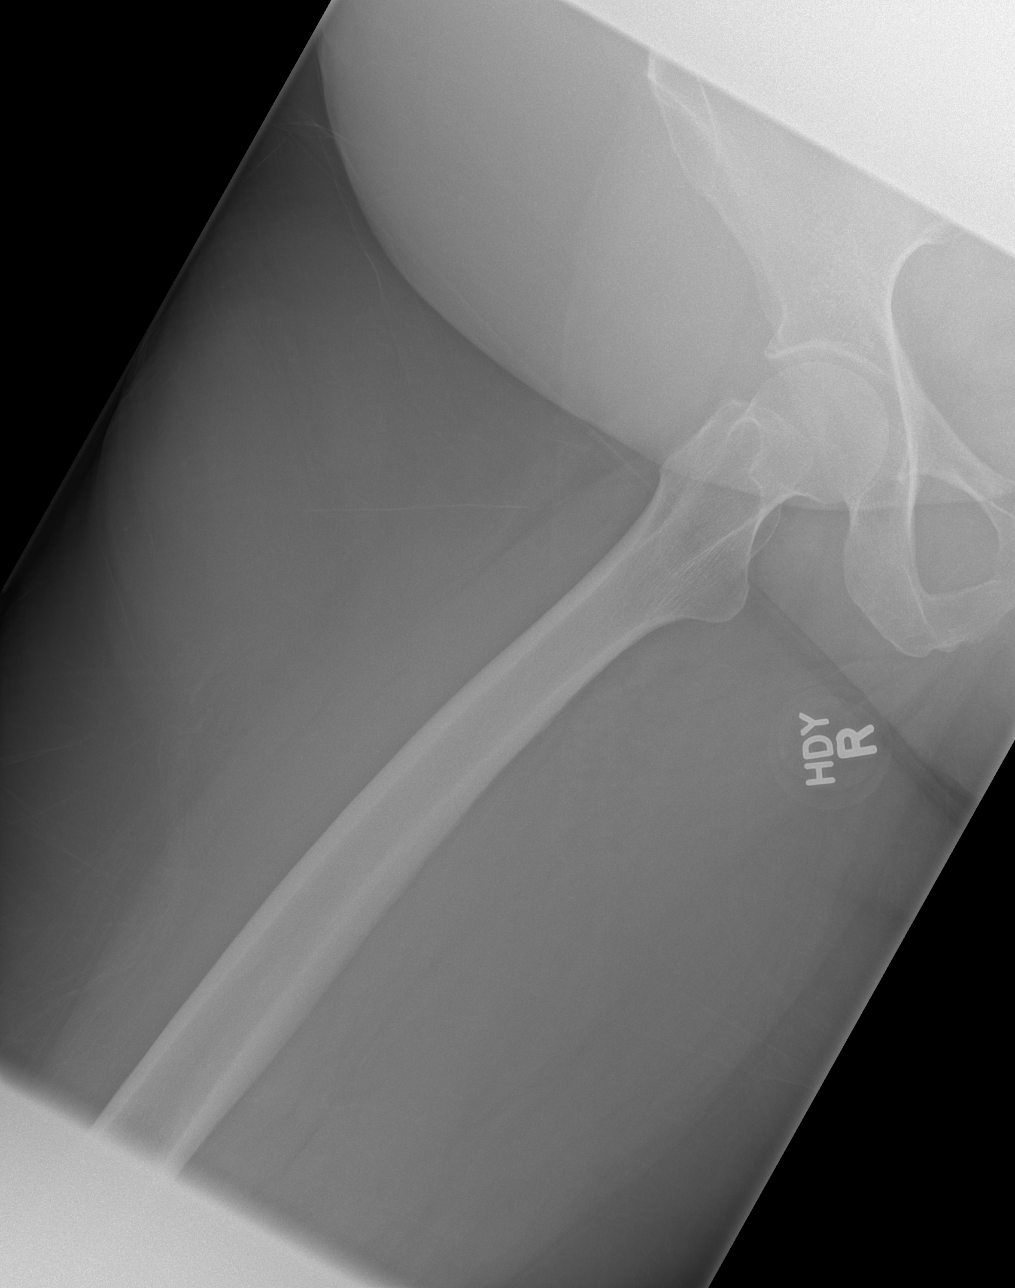
[im 4/4]
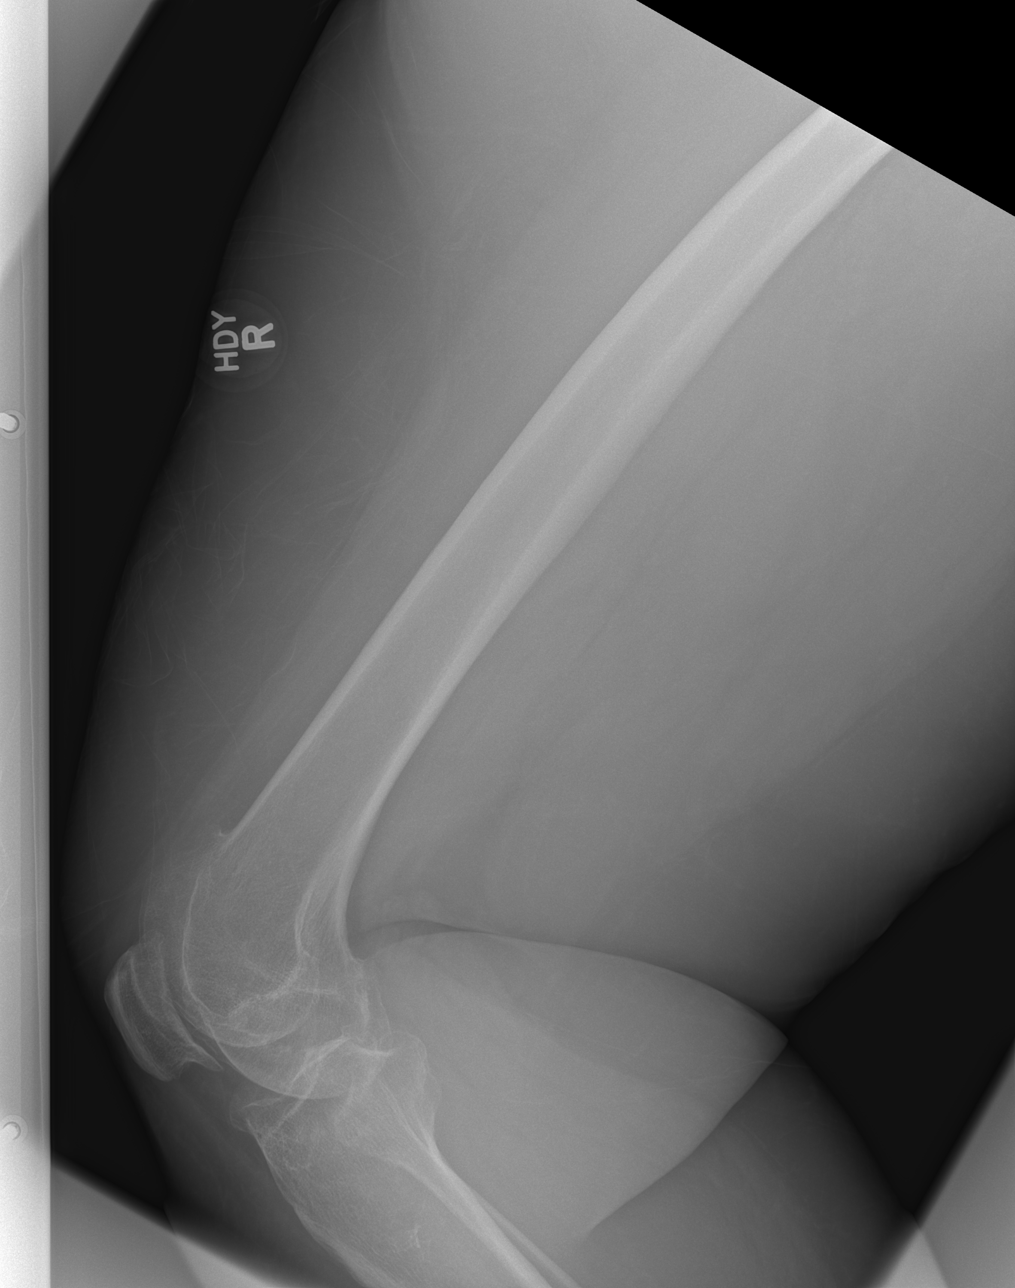

[4 of 4 positions shown; findings below may reference images not displayed]

IMPRESSION: 1. No evidence of acute abnormalities.
2. If there are persistent complaints of pain or persistent clinical
concern, a repeat evaluation in 7-10 days and/or MRI is recommended if
clinically warranted.
3. Osteoarthritic changes involving the knee

## 2015-01-19 ENCOUNTER — Emergency Department: Payer: Self-pay | Admitting: Emergency Medicine

## 2015-01-19 LAB — COMPREHENSIVE METABOLIC PANEL
Albumin: 3.8 g/dL
Alkaline Phosphatase: 84 U/L
Anion Gap: 7 (ref 7–16)
BUN: 12 mg/dL
Bilirubin,Total: 0.5 mg/dL
Calcium, Total: 9 mg/dL
Chloride: 107 mmol/L
Co2: 25 mmol/L
Creatinine: 0.74 mg/dL
EGFR (African American): >60
EGFR (Non-African Amer.): >60
Glucose: 123 mg/dL — ABNORMAL HIGH
Potassium: 3.7 mmol/L
SGOT(AST): 17 U/L
SGPT (ALT): 11 U/L — ABNORMAL LOW
Sodium: 139 mmol/L
Total Protein: 7.1 g/dL

## 2015-01-19 LAB — URINALYSIS, COMPLETE
Bilirubin,UR: NEGATIVE
Glucose,UR: NEGATIVE mg/dL (ref 0–75)
Ketone: NEGATIVE
Leukocyte Esterase: NEGATIVE
Nitrite: NEGATIVE
Ph: 6 (ref 4.5–8.0)
Protein: NEGATIVE
RBC,UR: 3 /HPF (ref 0–5)
Specific Gravity: 1.019 (ref 1.003–1.030)
Squamous Epithelial: 3
WBC UR: 2 /HPF (ref 0–5)

## 2015-01-19 LAB — CBC
HCT: 44.9 % (ref 35.0–47.0)
HGB: 15 g/dL (ref 12.0–16.0)
MCH: 29.7 pg (ref 26.0–34.0)
MCHC: 33.4 g/dL (ref 32.0–36.0)
MCV: 89 fL (ref 80–100)
Platelet: 260 10*3/uL (ref 150–440)
RBC: 5.06 10*6/uL (ref 3.80–5.20)
RDW: 14.6 % — ABNORMAL HIGH (ref 11.5–14.5)
WBC: 8.4 10*3/uL (ref 3.6–11.0)

## 2015-01-19 LAB — LIPASE, BLOOD: Lipase: 33 U/L

## 2015-02-09 NOTE — H&P (Signed)
PATIENT NAME:  Carla [name], Carla [name] MR#:  [id number] DATE OF BIRTH:  July 02, 1961  DATE OF ADMISSION:  07/22/2012  REFERRING PHYSICIAN: Dr. Lurline Hare PRIMARY CARE PHYSICIAN: None  CHIEF COMPLAINT: Stuttering of speech and left leg weakness.   HISTORY OF PRESENT ILLNESS: The patient is a very pleasant 54 year old Caucasian female with past medical history of hypertension, hyperlipidemia, gastroesophageal reflux disease, Bell's palsy, tobacco abuse, and morbid obesity who presented to [facility] with a two day history of stuttering speech and left leg weakness. As above the patient reports that her symptoms began approximately two days ago. She initially developed stuttering of speech. She also developed left lower extremity weakness and was stumbling. She states that she became scared and was afraid to come in for evaluation. The patient's daughter noticed these symptoms and advised her to come to the Emergency Department. Patient also has a boyfriend who stated that she should go in for evaluation. CT scan of the head was performed in the Emergency Department which was negative for acute CVA. She was given aspirin 325 mg in the Emergency Department. She currently denies any visual disturbance. She reports that she has been having some intermittent episodes of chest pain. They have numbered sometimes 4 to 5 episodes a day. These episodes of chest pain last several minutes and are located in the center of her chest and often radiate to the left arm. However, she does not have a history of known underlying coronary artery disease. The patient has history of active tobacco abuse, at least 40 pack-years. Patient does not follow with a primary care physician and she is currently not on any medications at home other than Tylenol.   PAST MEDICAL HISTORY:  1. Hypertension.  2. Hyperlipidemia.  3. Gastroesophageal reflux disease.  4. Bell's palsy.  5. Tobacco abuse which is active.  6. Morbid  obesity with a BMI of 40.8.   PAST SURGICAL HISTORY:  1. Cholecystectomy. 2. Hysterectomy.   ALLERGIES: No known drug allergies.   HOME MEDICATIONS: Tylenol, otherwise, no prescribed medications.   SOCIAL HISTORY: Patient lives in [place]. She is unmarried. She has three adult children. 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 She has active tobacco abuse, at least 40 pack-year history. She denies alcohol or illicit drug use.   FAMILY HISTORY: Mother is alive and has history of hypertension, coronary artery disease and myocardial infarction. The patient's father is also alive and has history of hypertension, diabetes mellitus, and has had five CVAs in the past.    REVIEW OF SYSTEMS: CONSTITUTIONAL: Denies fevers, chills, or weight loss. EYES: Currently denies diplopia, blurry vision or loss of vision. HEENT: Denies headaches, hearing loss, tinnitus, epistaxis, sore throat. CARDIOVASCULAR: As above, has had episodes of chest pain, sometimes 4 to 5 a day, located in the middle of her chest with radiation to the left arm. RESPIRATORY: Denies cough, shortness of breath, hemoptysis. GASTROINTESTINAL: Denies nausea, vomiting, diarrhea. GENITOURINARY: Denies frequency, urgency, dysuria. MUSCULOSKELETAL: Denies joint pain, swelling, or redness. INTEGUMENTARY: Denies skin rashes or lesions. NEUROLOGIC: As in history of present illness patient has had stuttering speech for two days and also has left lower extremity weakness with stumbling gait. PSYCHIATRIC: Denies depression, bipolar disorder. ENDOCRINE: Denies polyuria, polydipsia, or polyphagia. HEMATOLOGIC/LYMPHATIC: Denies easy bruisability, bleeding, or swollen lymph nodes. ALLERGY/IMMUNOLOGIC: Denies seasonal allergies or history of immunodeficiency.   PHYSICAL EXAMINATION:  VITAL SIGNS: Temperature 98.3, pulse 80, respirations 17, blood pressure 115/66 currently,  initial blood pressure upon arrival was 178/97.    GENERAL: Obese Caucasian female who appears her stated age, currently no acute distress.   HEENT: Normocephalic, atraumatic. Extraocular movements are intact. Pupils are equal, round, reactive to light. No scleral icterus. Conjunctivae are pink. No epistaxis noted. Gross hearing intact. Oral mucosa moist.   NECK: Supple without JVD. No carotid bruits were heard.   LUNGS: Clear to auscultation bilaterally with normal respiratory effort.   CARDIOVASCULAR: S1, S2 regular rate and rhythm. No obvious murmurs or rubs.   ABDOMEN: Obese, soft, nontender, nondistended. Bowel sounds positive. No rebound or guarding. No gross organomegaly appreciated.   EXTREMITIES: No clubbing, cyanosis, edema.   NEUROLOGIC: Patient is alert and oriented to time, person, and place. Strength is 5/5 in both upper extremities, in the right lower extremity strength is 5/5 and in the left lower extremity strength appears to be 4/5. She does not have as much strength to resistance as compared to the right lower extremity.   SKIN: Warm and dry. No rashes noted.   MUSCULOSKELETAL: No joint redness, swelling or tenderness appreciated.   PSYCHIATRIC: Patient with appropriate affect and appears to have good insight into her current illness.   LABORATORY, DIAGNOSTIC AND RADIOLOGICAL DATA: Sodium 142, potassium 3.7, chloride 106, CO2 23, BUN 14, creatinine 0.87, glucose 97, total protein 7.4, albumin 3.7, total bilirubin 0.2, alkaline phosphatase 99, AST 11, ALT 24, troponin less than 0.02. CBC shows WBC 10.4, hemoglobin 14.3, hematocrit 41, platelets 261. EKG was performed which shows normal sinus rhythm, otherwise no acute ST or T wave changes noted. CT head preliminary findings show no acute intracranial abnormality.   IMPRESSION AND PLAN: This is a 54 year old Caucasian female with past medical history of hypertension, hyperlipidemia, gastroesophageal reflux disease, Bell's palsy, tobacco abuse, morbid obesity who presented  to [facility] with stuttering speech and left lower extremity weakness consistent with acute cerebrovascular accident. 1. Acute cerebrovascular accident. This is manifested with stuttering speech and left lower extremity weakness. The patient was given aspirin 325 mg in the Emergency Department. The patient has had these symptoms for the past two days. Therefore, she is not a candidate for TPA therapy. We will proceed with MRI of the brain. Will also obtain 2-D echocardiogram as well as carotid duplex. We will continue the patient on aspirin for now. We will also obtain speech language pathology consultation as well as physical therapy consultation. In addition we will obtain consultation with neurology.  2. Hypertension. This has been untreated at home. We will start the patient on metoprolol 12.5 mg p.o. b.i.d. given recent history of chest pain.  3. Chest pain. Patient has been having 4 to 5 episodes of chest pain per day at home. Chest pain is located in the center of her chest and radiates to the left arm. Patient certainly has risk factors for underlying coronary artery disease including hypertension, obesity, tobacco abuse, and hyperlipidemia. However, I do not think it is currently the appropriate time to obtain stress testing given the acute cerebrovascular accident. Patient will be maintained on aspirin as well as beta blocker. We will cycle cardiac enzymes for now. Will also obtain cardiology consultation.  4. Hyperlipidemia. Will obtain fasting lipid profile and start the patient on Lipitor 10 mg p.o. daily for now.  5. Tobacco abuse. The patient counseled extensively on tobacco cessation given suspected acute cerebrovascular accident now as well as coronary artery disease risk factors. Patient verbalized understanding.  6. Deep vein thrombosis prophylaxis.  Will start the patient on heparin 5000 units sub-Q q.12 hours.  7. CODE STATUS: FULL CODE.   TIME SPENT: One hour.    ____________________________ Tama High, MD mnl:cms D: 07/22/2012 03:39:37 ET T: 07/22/2012 06:42:33 ET JOB#: [id number]  cc: Tama High, MD, <Dictator> Mariah Milling Shelene Krage MD ELECTRONICALLY SIGNED 07/30/2012 23:30

## 2015-02-09 NOTE — Consult Note (Signed)
PATIENT NAME:  Carla [name], Carla [name] MR#:  [id number] DATE OF BIRTH:  [birth year]  DATE OF CONSULTATION:  07/22/2012  REFERRING PHYSICIAN:  Anthonette Legato, MD  CONSULTING PHYSICIAN:  Muhammad A. Fletcher Anon, MD  REASON FOR CONSULTATION: Chest pain.   HISTORY OF PRESENT ILLNESS: This is a pleasant 54 year old Caucasian female with past medical history of hypertension, hyperlipidemia, gastroesophageal reflux disease, Bell's palsy,  tobacco use, and obesity. She reports no previous cardiac history. She presented to the Emergency Room with a two day history of slurred speech and left leg weakness. The patient likely has a stroke. She had a CT scan of the head done which showed no acute abnormalities. She reports left-sided chest discomfort of more than one month duration. This has been intermittent. It is described as sharp pain happening mostly at rest and lasted for a few minutes. It occasionally happens with physical activities as well. It is associated with mild dyspnea. The patient's cardiac enzymes were noted to be normal.   PAST MEDICAL HISTORY:  1. Hypertension.  2. Hyperlipidemia.  3. Gastroesophageal reflux disease.  4. Bell's palsy.  5. Tobacco abuse.  6. Obesity.   PAST SURGICAL HISTORY:  1. Cholecystectomy. 2. Hysterectomy.   ALLERGIES: No known drug allergies.   HOME MEDICATIONS: Acetaminophen as needed. No other prescribed medications.   SOCIAL HISTORY: The patient lives in [place]. She is currently unemployed. She smokes 1 pack per day. She denies any alcohol or recreational drug use.   FAMILY HISTORY: Family history is remarkable for coronary artery disease and stroke.   REVIEW OF SYSTEMS: A 10 point review of systems was performed. It is negative other than what is mentioned in the history of present illness.   PHYSICAL EXAMINATION:   GENERAL: The patient appears to be at her stated age in no acute distress.   VITAL SIGNS: Temperature 97.5, pulse 68, respiratory rate 18, blood  pressure 116/79, oxygen saturation is 94% on 3 liters nasal cannula.   HEENT: Normocephalic, atraumatic.   NECK: No JVD or carotid bruits.   RESPIRATORY: Normal respiratory effort with no use of accessory muscles. Auscultation reveals normal breath sounds.   CARDIOVASCULAR: Normal PMI. Normal S1 and S2 with no gallops or murmurs.   ABDOMEN: Benign, nontender, nondistended.   EXTREMITIES: No clubbing, cyanosis, or edema.   SKIN: Warm and dry with no rash.   PSYCHIATRIC: She is alert, oriented x3 with normal mood and affect. She is noted to have slurred speech. There is weakness in the left lower extremity and the left arm.   LABORATORY AND DIAGNOSTIC DATA: Renal function was normal. CBC was normal. Cardiac enzymes within normal limits. Lipid profile showed an LDL of 191 and triglycerides of 311.   ECG showed sinus rhythm with no significant ST or T wave changes.   IMPRESSION:  1. Ischemic stroke.  2. Atypical chest pain with multiple risk factors for coronary artery disease.   RECOMMENDATIONS: The patient appears to have suffered from an ischemic stroke. She still has significant slurred speech currently. The etiology of her stroke is not entirely clear. I agree with brain MRI. She is also going to have a transthoracic echocardiogram to look for cardiac source of emboli as well as carotid duplex ultrasound. She is currently on aspirin. She was also started on metoprolol and atorvastatin. Her chest pain is overall atypical. However, she has multiple risk factors for coronary artery disease. It is probably worth investigating with ischemic cardiac evaluation at some point once her acute neurologic issues  are resolved. Currently her ECG does not show significant ischemic changes and cardiac enzymes are negative. Thus, there is no urgency in cardiac evaluation.   ____________________________ Mertie Clause Fletcher Anon, MD maa:drc D: 07/22/2012 09:34:27 ET T: 07/22/2012 09:58:24  ET JOB#: [id number]  cc: Rogue Jury A. Fletcher Anon, MD, <Dictator> Wellington Hampshire MD ELECTRONICALLY SIGNED 07/26/2012 13:18

## 2015-02-09 NOTE — Discharge Summary (Signed)
PATIENT NAME:  Carla [name], Carla [name] MR#:  [id number] DATE OF BIRTH:  01/05/1961  DATE OF ADMISSION:  07/22/2012 DATE OF DISCHARGE:  07/23/2012  ADMITTING PHYSICIAN: Anthonette Legato, MD  DISCHARGING PHYSICIAN: Gladstone Lighter, MD  PRIMARY CARE PHYSICIAN: None. Appointment is scheduled at the [facility] at the time of discharge.  CONSULTANTS:  1. Gurney Maxin, MD - Neurology. 2. Kathlyn Sacramento, MD - Cardiology.  DISCHARGE DIAGNOSES:  1. Stuttering speech, likely psychogenic. No evidence of any stroke.  2. Depression and anxiety.  3. Hypertension.  4. Hyperlipidemia.  5. Tobacco use disorder   DISCHARGE HOME MEDICATIONS: 1. Aspirin 325 mg p.o. daily.  2. Metoprolol 25 mg p.o. twice a day. 3. Celexa 20 mg p.o. daily.  4. Atorvastatin 40 mg p.o. daily.   DISCHARGE DIET: Low sodium, low fat diet.   DISCHARGE ACTIVITY: As tolerated.   FOLLOWUP INSTRUCTIONS:  1. Primary care physician follow-up in 1 to 2 weeks.  2. Myoview as an outpatient.  3. Home health physical therapy and speech therapy. 4. Referral to [facility].  5. An appointment has been scheduled with the [facility] for 08/13/2012 at 5:00 p.m.   LABS/RADIOLOGIC STUDIES: WBC 8.1, hemoglobin 13.3, hematocrit 39.4, and platelet count 244.   Sodium 142, potassium 3.8, chloride 107, bicarbonate 23, BUN 16, creatinine 0.93, glucose 140, and calcium 8.9.   ALT 19, AST 11, alkaline phosphatase 88, total bilirubin 0.3, and albumin 3.3. INR 0.8. Troponins have remained negative while in the hospital. Her ANA panel is negative other than nonspecific slightly elevated anti-SSB. ESR is within normal limits at 11 and CRP slightly elevated at 9.  Her ultrasound carotid Doppler's bilaterally are showing no evidence of hemodynamically significant carotid stenosis.   MRI of the brain is showing no acute or focal abnormality.   Echo Doppler is showing no cardiac source of emboli, ejection fraction greater than 55%,  and mild concentric LVH is noted. No atrial septal defect is noted.   LDL is 191, HDL 51, triglycerides 311, and total cholesterol 304. Hemoglobin A1C is 5.7.  CT of the head on admission is showing subtle subcortical posterior right frontal lobe white matter low attenuation secondary to microangiopathy versus ischemia versus vasculitis, but MRI was negative.   Urinalysis was negative for any infection.   BRIEF HOSPITAL COURSE: Ms. Stoecker is a 54 year old Caucasian female with past medical history significant for hypertension, gastroesophageal reflux disease, and hyperlipidemia who did not take any medications at home and was brought in secondary to stuttering of the speech and also left leg weakness going on for a few days prior to admission.  1. Stuttering speech and left leg weakness, initially felt to be possible CVA/ TIA. She did not have any neurological deficits other than stuttering speech on examination. Neurology was consulted. Her strength was five out of five in all four extremities and no sensory deficits were noted. She does have intact cranial nerves. Her tongue strength is normal. She does not have swallowing dysfunction. Her speech is stuttering but once she was on the floor she did not stutter much. Less likely to be an organic dysfunction so felt more like psychogenic. The patient denies any depression, anxiety or stress going on in her life. No suicidal intentions. However, the speech pathologist has noted that she was a little tearful after she had some family last evening. However, she reports she is not depressed on my examination, other than regular stress. So she is started on aspirin and  statin and advised to stop smoking. She is referred to [facility] and she will be discharged home with home physical therapy and speech therapy. Her tests including MRI, carotid Dopplers, and Echo were normal. She had an ANCA panel done which was negative, to rule out vasculitis.   2. Hypertension. The patient was started on metoprolol while in the hospital.  3. Hyperlipidemia. Strongly counseled about low sodium and low fat diet. She was started on Lipitor while in the hospital.  4. Subclinical depression. She was started on Celexa. 5. Smoking. She was extensively counseled on smoking cessation and was on a nicotine patch while in the hospital.  CODE STATUS: FULL CODE.   DISCHARGE DISPOSITION: Home with home health physical therapy and speech therapy.   DISCHARGE CONDITION: Stable.   TIME SPENT ON DISCHARGE: 45 minutes.  ____________________________ Gladstone Lighter, MD rk:slb D: 07/23/2012 15:04:55 ET T: 07/24/2012 11:07:56 ET JOB#: [id number]  cc: Gladstone Lighter, MD, <Dictator> [facility] Gladstone Lighter MD ELECTRONICALLY SIGNED 07/25/2012 14:58

## 2015-02-09 NOTE — Consult Note (Signed)
Referring Physician:  Tama High   Primary Care Physician:  Sheldon Silvan, [address] [place], [place], [place] [address], [phone]  Reason for Consult:  Admit Date: 22-Jul-2012   Reason for Consult: CVA   History of Present Illness:  History of Present Illness:   PATIENT NAME:  Carla [name], Carla [name] [id number] OF BIRTH:  [birth year] OF ADMISSION:  07/22/2012 CARE PHYSICIAN: None COMPLAINT: Stuttering of speech and left leg weakness.  OF PRESENT ILLNESS: woman with a history of hypertension, hyperlipidemia, Bell's palsy, tobacco abuse, and morbid obesity who presented to [facility] with a three day history of stuttering speech and left leg weakness. She says her symptoms really started on Friday.  She had abrupt onset difficulty getting her words out with stuttering as the primary symptom.  She says that her symptoms worsened over the weekend.  She felt unsteady walking as well.  She reports feeling some weakness in the left leg, although this is now resolved.  The symptoms were constant and did not come and go.  Nothing made them better or worse.  There was not precipitating factor.  She felt that the symptoms were of moderate severity.  She has not had difficulty eating despite the speech deficit.  HCT was negative for acute changes.  She has been started on ASA 325 mg daily.   MEDICAL HISTORY:  1. Hypertension.  2. Hyperlipidemia.  Gastroesophageal reflux disease.  Bell's palsy.  Tobacco abuse which is active.  Morbid obesity with a BMI of 40.8.  SURGICAL HISTORY:  1. Cholecystectomy. 2. Hysterectomy.   ALLERGIES: No known drug allergies.  MEDICATIONS: Tylenol, otherwise, no prescribed medications.  HISTORY: Patient lives in [place]. She is unmarried. She has three adult children. 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 She has active tobacco abuse, at least 40 pack-year history. She denies alcohol or  illicit drug use.  HISTORY: Mother is alive and has history of hypertension, coronary artery disease and myocardial infarction. The patient's father is also alive and has history of hypertension, diabetes mellitus, and has had five CVAs in the past.    GENERAL.well developed woman.  Normocephalic and atraumatic. exam without pharmacologic dilation shows normal disc size, appearance and C/D ratio.  There is no papilledema. and S2 sounds are within normal limits, without murmurs, gallops, or rubs. - Obese- NormalDrift - Absent bilaterally.- Normal ambulation, no unsteadiness, efficient turns.    Shoulder abduction (deltoid/supraspinatus, axillary/suprascapular n, C5)   Elbow flexion (biceps brachii, musculoskeletal n, C5-6)   Elbow extension (triceps, radial n, C7)   Finger adduction (interossei, ulnar n, T1)    Hip flexion (iliopsoas, L1/L2)   Knee flexion (hamstrings, sciatic n, L5/S1)    Knee extension (quadriceps, femoral n, L3/4)   Ankle dorsiflexion (tibialis anterior, deep fibular n, L4/5)   Ankle plantarflexion (gastroc, tibial n, S1)  STATUS.is oriented to person, place and time.  Recent and remote memory are intact.  Attention span and concentration are intact.  Naming, repetition, and comprehension speech are within normal limits.  Expressive speech is normal with the exception of occasional stuttering while speaking, but there is no apraxia to the voice to suggest expressive aphasia.  There are no paraphasic errors.  Patient's fund of knowledge is within normal limits for educational level. NERVES.   CN II (normal visual acuity and visual fields)   CN III, IV, VI (extraocular muscles are intact)   CN V (  facial sensation is intact bilaterally)   CN VII (facial strength is intact bilaterally)   CN VIII (hearing is intact bilaterally)   CN IX/X (palate elevates midline, normal phonation)   CN XI (shoulder shrug strength is normal and symmetric)   CN XII (tongue protrudes midline) to pain and temp  bilaterally (spinothalamic tracts)to position and vibration bilaterally (dorsal columns)    Biceps   Brachioradialis    Patellar   Achillessign is absent bilaterally. to nose testing is within normal limits bilaterally. DIAGNOSTIC AND RADIOLOGICAL DATA: Sodium 142, potassium 3.7, chloride 106, CO2 23, BUN 14, creatinine 0.87, glucose 97, total protein 7.4, albumin 3.7, total bilirubin 0.2, alkaline phosphatase 99, AST 11, ALT 24, troponin less than 0.02. CBC shows WBC 10.4, hemoglobin 14.3, hematocrit 41, platelets 261. EKG was performed which shows normal sinus rhythm, otherwise no acute ST or T wave changes noted. CT head preliminary findings show no acute intracranial abnormality.   IMPRESSION AND RECS:woman with a history of hypertension, hyperlipidemia, Bell's palsy, tobacco abuse, and morbid obesity who presented to [facility] with three days of stuttering speech and left leg weakness.  Overall, the stuttering speech deficit does not appear to be organic.  This does not represent a typical expressive aphasia as the fluidity is normal once the stuttering is overcome which it always is.  However, the patient should proceed with a stroke workup as the exam cannot rule this out entirely.    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.Brain MRI/A to evaluate for other structural lesions or dissections.If workup is negative then psychiatry consult for possible conversion disorder.Carotid doppler to rule out carotid stenosisTTE with bubble study to evaluate for intracardiac shunts or cardiac thrombiMonitor on telemetry and repeat EKG to r/o afibCheck fasting lipid panel, TSH and HbA1cCycle cardiac enzymesCardiac monitoring as described above.Antiplatelet therapy: aspirin 81 mg PO  dailyTREATMENT:Per ATP III guidlines, given that patient has other stroke risk factors such as diabetes, elevated CRP, carotid disease, treat if LDL > 70PRESSURE CONTROL:During the first several hours after stroke, control for factors such as anxiety/stress/pain. When these factors are controlled, if blood pressure remains higher than 220/110, treat with a short acting agent (IV labetalol).Allow permissive HTN for BP less than 220/110The 2 trials of antihypertensive therapy to prevent recurrent stroke that have shown benefit started medication >2 weeks and >4 weeks after the acute stroke.PT, OT, Speech evaluationsConsult speech therapy for swallowing assessment and stroke education.Strict NPO until cleared by speech therapy.Fall PrecautionsCounseling for smoke cessation as needed.DVT prophylaxis with [place] lovenox. Melrose Nakayama, MD   ROS:   General denies complaints     HEENT no complaints     Lungs no complaints     Cardiac no complaints     GI no complaints     GU no complaints     Musculoskeletal no complaints     Extremities no complaints     Skin no complaints     Neuro no complaints     Endocrine no complaints    Past Medical/Surgical Hx:  hypertension:   GERD - Esophageal Reflux:   Hypercholesterolemia:   Bells Palsy:   Denies:   Cholecystectomy:   Hysterectomy - Total:   Home Medications: Medication Instructions Last Modified Date/Time  Tylenol [place] Extra Strength 500 mg oral tablet 2 tab(s) orally every  6 hours, As Needed- for Pain  30-Sep-13 02:08   Allergies:  No Known Allergies:   Vital Signs: **Vital Signs.:   30-Sep-13 16:25   Vital Signs Type Routine   Temperature Temperature (F) 97.9   Celsius 36.6   Temperature Source Oral   Pulse Pulse 74   Respirations Respirations 18   Systolic BP Systolic BP 932   Diastolic BP (mmHg) Diastolic BP (mmHg) 76   Mean BP 94   Pulse Ox % Pulse Ox % 94   Pulse Ox Activity Level  At rest   Oxygen Delivery Room Air/ 21 %    Lab Results:  Hepatic:  29-Sep-13 21:25    Bilirubin, Total 0.2   Alkaline Phosphatase 99   SGPT (ALT) 24   SGOT (AST)  11   Total Protein, Serum 7.4   Albumin, Serum 3.7  Cardiology:  29-Sep-13 21:20    Ventricular Rate 89   Atrial Rate 89   P-R Interval 152   QRS Duration 88   QT 360   QTc 438   P Axis 32   R Axis 21   T Axis 45   ECG interpretation *** Poor data quality, interpretation may be adversely affected Sinus rhythm with occasional Premature ventricular complexes Otherwise normal ECG When compared with ECG of 24-Jul-2011 16:56, Premature ventricular complexes are now Present ----------unconfirmed---------- Confirmed by OVERREAD, NOT ([id number]), editor PEARSON, BARBARA ([id number]) on 07/22/2012 1:46:01 PM  Routine Chem:  29-Sep-13 21:25    Lipase 245 (Result(s) reported on 22 Jul 2012 at 01:41AM.)   Glucose, Serum 97   BUN 14   Creatinine (comp) 0.87   Sodium, Serum 142   Potassium, Serum 3.7   Chloride, Serum 106   CO2, Serum 23   Calcium (Total), Serum 8.8   Osmolality (calc) 284   eGFR (African American) >60   eGFR (Non-African American) >60 (eGFR values <4mL/min/1.73 m2 may be an indication of chronic kidney disease (CKD). Calculated eGFR is useful in patients with stable renal function. The eGFR calculation will not be reliable in acutely ill patients when serum creatinine is changing rapidly. It is not useful in  patients on dialysis. The eGFR calculation may not be applicable to patients at the low and high extremes of body sizes, pregnant women, and vegetarians.)   Anion Gap 13  30-Sep-13 05:49    Hemoglobin A1c ([facility]) 5.7 (The American Diabetes Association recommends that a primary goal of therapy should be <7% and that physicians should reevaluate the treatment regimen in patients with HbA1c values consistently >8%.)   Cholesterol, Serum  304   Triglycerides, Serum  311   HDL ([facility]) 51   VLDL Cholesterol Calculated  62   LDL Cholesterol  Calculated  191 (Result(s) reported on 22 Jul 2012 at 08:12AM.)  Cardiac:  29-Sep-13 21:25    Troponin I < 0.02 (0.00-0.05 0.05 ng/mL or less: NEGATIVE  Repeat testing in 3-6 hrs  if clinically indicated. >0.05 ng/mL: POTENTIAL  MYOCARDIAL INJURY. Repeat  testing in 3-6 hrs if  clinically indicated. NOTE: An increase or decrease  of 30% or more on serial  testing suggests a  clinically important change)  30-Sep-13 05:49    Troponin I < 0.02 (0.00-0.05 0.05 ng/mL or less: NEGATIVE  Repeat testing in 3-6 hrs  if clinically indicated. >0.05 ng/mL: POTENTIAL  MYOCARDIAL INJURY. Repeat  testing in 3-6 hrs if  clinically indicated. NOTE: An increase or decrease  of 30% or more on serial  testing suggests a  clinically important change)   CK, Total 39   CPK-MB, Serum 0.5 (Result(s) reported on 22 Jul 2012 at 06:17AM.)    14:41    Troponin I < 0.02 (0.00-0.05 0.05 ng/mL or less: NEGATIVE  Repeat testing in 3-6 hrs  if clinically indicated. >0.05 ng/mL: POTENTIAL  MYOCARDIAL INJURY. Repeat  testing in 3-6 hrs if  clinically indicated. NOTE: An increase or decrease  of 30% or more on serial  testing suggests a  clinically important change)   CK, Total 38   CPK-MB, Serum  < 0.5 (Result(s) reported on 22 Jul 2012 at 03:17PM.)  Routine UA:  30-Sep-13 04:50    Color (UA) Yellow   Clarity (UA) Hazy   Glucose (UA) Negative   Bilirubin (UA) Negative   Ketones (UA) Negative   Specific Gravity (UA) 1.016   Blood (UA) 1+   pH (UA) 5.0   Protein (UA) Negative   Nitrite (UA) Negative   Leukocyte Esterase (UA) Negative (Result(s) reported on 22 Jul 2012 at 05:09AM.)   RBC (UA) 1 /HPF   WBC (UA) 2 /HPF   Bacteria (UA) TRACE   Epithelial Cells (UA) 3 /HPF (Result(s) reported on 22 Jul 2012 at 05:09AM.)  Routine Hem:  29-Sep-13 21:25    WBC (CBC) 10.4   RBC (CBC) 4.40   Hemoglobin (CBC) 14.3   Hematocrit (CBC) 41.3   Platelet Count (CBC) 261 (Result(s) reported on 21 Jul 2012 at 09:46PM.)   MCV 94   MCH 32.5   MCHC 34.6   RDW 13.6  30-Sep-13 14:41    Erythrocyte Sed Rate 11 (Result(s) reported on 22 Jul 2012 at 03:34PM.)   Radiology Results:  [place]:    30-Sep-13 10:59, US Carotid Doppler Bilateral   US Carotid Doppler Bilateral    REASON FOR EXAM:    cva  COMMENTS:       PROCEDURE: [place]  - US CAROTID DOPPLER BILATERAL  - Jul 22 2012 10:59AM     RESULT: The carotid arteries were evaluated with duplex sonography.   Neither the right nor left carotid systems demonstrate significant  calcified plaque. A small amount of soft plaque is present in the   proximal ICA on the left. The waveform patterns and color flow images do   not suggest significant turbulence.    On the right the peak internal carotid systolic velocity measured 111 cm   per second and the peak common carotid velocity measured 120 cm per   second corresponding to a normal ratio of 0.9. On the left the peak   internal carotid systolic velocity measured 118 cm per second and the     peak common carotid velocitymeasured 118 cm per second corresponding to   normal ratio of 1.0. The vertebral arteries are normal in flow direction   bilaterally.    IMPRESSION:  There is no evidence of a hemodynamically significant   carotid stenosis.     Dictation Site: 2          Verified By: DAVID A. Martinique, M.D., MD  CT:    30-Sep-13 01:47, CT Head Without Contrast   CT Head Without Contrast    REASON FOR EXAM:    STUTTERING  COMMENTS:   May transport without cardiac monitor    PROCEDURE: CT  - CT HEAD WITHOUT CONTRAST  - Jul 22 2012  1:47AM     RESULT: Comparison:  None    Technique: Multiple axial images from the foramen magnum to the vertex  were obtained without IV contrast.    Findings:      There is no evidence of mass effect, midline shift, or extra-axial fluid   collections.  There is no evidence of a space-occupying lesion or   intracranial hemorrhage. There is no evidence of a  cortical-based area of     acute infarction. There is subtle subcortical posterior right frontal   lobe white matter low attenuation which may secondary to microangiopathy   versus ischemia versus vasculitis versus other etiologies.    The ventricles and sulci are appropriate for the patient's age. The basal   cisterns are patent.    Visualized portions of the orbits are unremarkable. The visualized   portions of the paranasal sinuses and mastoid air cells are unremarkable.     The osseous structures are unremarkable.    IMPRESSION:      There is subtle subcortical posterior right frontal lobe white matter low     attenuation which may secondary to microangiopathy versus ischemia versus   vasculitis versus other etiologies. Recommend furtherevaluation with MRI   of the brain.    Dictation Site: 1          Verified By: Jennette Banker, M.D., MD   Electronic Signatures: Anabel Bene (MD)  (Signed 30-Sep-13 18:50)  Authored: REFERRING PHYSICIAN, Primary Care Physician, Consult, History of Present Illness, Review of Systems, PAST MEDICAL/SURGICAL HISTORY, HOME MEDICATIONS, ALLERGIES, NURSING VITAL SIGNS, LAB RESULTS, RADIOLOGY RESULTS   Last Updated: 30-Sep-13 18:50 by Anabel Bene (MD)

## 2015-02-12 ENCOUNTER — Emergency Department: Admit: 2015-02-12 | Discharge status: Against Medical Advice | Payer: Self-pay | Admitting: Emergency Medicine

## 2015-02-12 LAB — CBC
HCT: 40.9 % (ref 35.0–47.0)
HGB: 13.4 g/dL (ref 12.0–16.0)
MCH: 29.4 pg (ref 26.0–34.0)
MCHC: 32.7 g/dL (ref 32.0–36.0)
MCV: 90 fL (ref 80–100)
Platelet: 272 10*3/uL (ref 150–440)
RBC: 4.55 10*6/uL (ref 3.80–5.20)
RDW: 14.8 % — ABNORMAL HIGH (ref 11.5–14.5)
WBC: 6.9 10*3/uL (ref 3.6–11.0)

## 2015-02-12 LAB — TROPONIN I: Troponin-I: <0.03 ng/mL

## 2015-02-12 LAB — BASIC METABOLIC PANEL
Anion Gap: 6 — ABNORMAL LOW (ref 7–16)
BUN: 15 mg/dL
Calcium, Total: 9 mg/dL
Chloride: 107 mmol/L
Co2: 27 mmol/L
Creatinine: 0.78 mg/dL
EGFR (African American): >60
EGFR (Non-African Amer.): >60
Glucose: 120 mg/dL — ABNORMAL HIGH
Potassium: 2.9 mmol/L — ABNORMAL LOW
Sodium: 140 mmol/L

## 2015-02-12 LAB — PRO B NATRIURETIC PEPTIDE: B-Type Natriuretic Peptide: 42 pg/mL

## 2015-02-14 ENCOUNTER — Emergency Department
Admit: 2015-02-14 | Disposition: A | Discharge status: Home / Self Care | Payer: Self-pay | Admitting: Emergency Medicine

## 2015-03-05 ENCOUNTER — Emergency Department
Admission: EM | Admit: 2015-03-05 | Discharge: 2015-03-05 | Disposition: A | Discharge status: Home / Self Care | Payer: Self-pay | Attending: Emergency Medicine | Admitting: Emergency Medicine

## 2015-03-05 ENCOUNTER — Encounter: Payer: Self-pay | Admitting: Emergency Medicine

## 2015-03-05 DIAGNOSIS — Z79899 Other long term (current) drug therapy: Secondary | ICD-10-CM | POA: Insufficient documentation

## 2015-03-05 DIAGNOSIS — Z791 Long term (current) use of non-steroidal anti-inflammatories (NSAID): Secondary | ICD-10-CM | POA: Insufficient documentation

## 2015-03-05 DIAGNOSIS — I10 Essential (primary) hypertension: Secondary | ICD-10-CM | POA: Insufficient documentation

## 2015-03-05 DIAGNOSIS — G8929 Other chronic pain: Secondary | ICD-10-CM | POA: Insufficient documentation

## 2015-03-05 DIAGNOSIS — Z720 Tobacco use: Secondary | ICD-10-CM | POA: Insufficient documentation

## 2015-03-05 DIAGNOSIS — M1711 Unilateral primary osteoarthritis, right knee: Secondary | ICD-10-CM | POA: Insufficient documentation

## 2015-03-05 DIAGNOSIS — M1990 Unspecified osteoarthritis, unspecified site: Secondary | ICD-10-CM

## 2015-03-05 DIAGNOSIS — Z7982 Long term (current) use of aspirin: Secondary | ICD-10-CM | POA: Insufficient documentation

## 2015-03-05 MED ORDER — DEXAMETHASONE SODIUM PHOSPHATE 10 MG/ML IJ SOLN
10.0000 mg | Freq: Once | INTRAMUSCULAR | Status: AC
  Administered 2015-03-05: 10 mg via INTRAMUSCULAR

## 2015-03-05 MED ORDER — DEXAMETHASONE SODIUM PHOSPHATE 10 MG/ML IJ SOLN
INTRAMUSCULAR | Status: AC
  Filled 2015-03-05: qty 1

## 2015-03-05 MED ORDER — TRAMADOL HCL 50 MG PO TABS
50.0000 mg | ORAL_TABLET | Freq: Once | ORAL | Status: AC
  Administered 2015-03-05: 50 mg via ORAL

## 2015-03-05 MED ORDER — MELOXICAM 7.5 MG PO TABS: 7.5000 mg | ORAL_TABLET | Freq: Every day | ORAL | Status: DC

## 2015-03-05 MED ORDER — TRAMADOL HCL 50 MG PO TABS: 50.0000 mg | ORAL_TABLET | Freq: Four times a day (QID) | ORAL | Status: DC | PRN

## 2015-03-05 MED ORDER — TRAMADOL HCL 50 MG PO TABS
ORAL_TABLET | ORAL | Status: AC
  Filled 2015-03-05: qty 1

## 2015-03-05 MED ORDER — KETOROLAC TROMETHAMINE 60 MG/2ML IM SOLN
INTRAMUSCULAR | Status: AC
  Filled 2015-03-05: qty 2

## 2015-03-05 MED ORDER — KETOROLAC TROMETHAMINE 60 MG/2ML IM SOLN
60.0000 mg | Freq: Once | INTRAMUSCULAR | Status: AC
  Administered 2015-03-05: 60 mg via INTRAMUSCULAR

## 2015-03-05 NOTE — ED Notes (Signed)
Pt. In from the front with c/o of rt. Knee pain that moves into rt. Hip.  Pt. States non-traumatic injury to rt. knee that started yesterday.  Pt. 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  Pt. Also states hx of cysts to the back of bilateral knees.

## 2015-03-05 NOTE — ED Provider Notes (Signed)
CSN: [id number]     Arrival date & time 03/05/15  1509 History   First MD Initiated Contact with Patient 03/05/15 1542     Chief Complaint  Patient presents with  . Knee Pain     (Consider location/radiation/quality/duration/timing/severity/associated sxs/prior Treatment) HPI patient states she's been having worsening knee pain that started yesterday. Rates the pain as approximately a 9-10 out of 10 with any type of movement states that she was on regular prescriptions for tramadol which were taken away from her because she had an ER visit in which she filled a prescription for another medication and is currently working that out with her doctor states she was seen here in April and told that she has bilateral Baker's cyst which may be contributing to her problems denies any trauma states that the pain just seems to be shooting up to her hip worse than normal denies any stress swelling in her lower extremities and otherwise no complaints at this time except for severe arthritic pain in her knee nothing seemingly making it better any type of movement walking touch to the area making it worse denies any fevers or chills shortness of breath or chest pain.  Past Medical History  Diagnosis Date  . Hypertension   . Hyperlipidemia   . GERD (gastroesophageal reflux disease)   . Bell's palsy   . Morbid obesity with BMI of 40.0-44.9, adult    Past Surgical History  Procedure Laterality Date  . Cholecystectomy    . Vaginal hysterectomy     Family History  Problem Relation Age of Onset  . Hypertension Mother   . Heart disease Mother   . Heart attack Mother   . Hypertension Father    History  Substance Use Topics  . Smoking status: Current Every Day Smoker    Types: Cigarettes  . Smokeless tobacco: Not on file  . Alcohol Use: No   OB History    No data available     Review of Systems  Negative 6 systems as best review the patient except for noted in history of present illness Denies  any swelling of the lower extremities chest pain shortness of breath fever cough nausea numbness tingling or weakness that is new     Allergies  Review of patient's allergies indicates no known allergies.  Home Medications   Prior to Admission medications   Medication Sig Start Date End Date Taking? Authorizing Provider  aspirin 325 MG tablet Take 325 mg by mouth daily.    Historical Provider, MD  atorvastatin (LIPITOR) 40 MG tablet Take 40 mg by mouth daily.    Historical Provider, MD  citalopram (CELEXA) 20 MG tablet Take 20 mg by mouth daily.    Historical Provider, MD  meloxicam (MOBIC) 7.5 MG tablet Take 1 tablet (7.5 mg total) by mouth daily. 03/05/15 03/04/16  Kodi Guerrera William C Jachin Coury, PA-C  metoprolol tartrate (LOPRESSOR) 25 MG tablet Take 25 mg by mouth 2 (two) times daily.    Historical Provider, MD  traMADol (ULTRAM) 50 MG tablet Take 1 tablet (50 mg total) by mouth every 6 (six) hours as needed. 03/05/15   Garielle Mroz William C Tammra Pressman, PA-C   BP 164/135 mmHg  Pulse 121  Temp(Src) 98.4 F (36.9 C) (Oral)  Resp 18  Ht 5\' 3"  (1.6 m)  Wt 225 lb (102.059 kg)  BMI 39.87 kg/m2  SpO2 100% Physical Exam physical exam reveals a morbidly obese Caucasian female appearing stated age well-developed well-nourished in no acute distress vitals reviewed  in the nurse's note Head ears eyes nose neck and throat exam were negative cardiovascular showed a regular rate and rhythm on exam rate was 100 bpm Pulmonary lungs are clear to auscultation bilaterally Abdomen soft flat nontender bowel sounds positive in all 4 quadrants Musculoskeletal she has tenderness with palpation to any part of the anterior portion of her knee negative Homans sign no peripheral edema Skin appears for rash or disease Psychologically patient appears anxious  ED Course  Procedures (including critical care time) Labs Review Labs Reviewed - No data to display  Imaging Review No results found.   EKG Interpretation None      diagnostic impression on this patient arthritic pain patient was given Toradol Ultram and Decadron in the ER today we'll discharge her with prescription for meloxicam and a 2 day course of tramadol she is to follow-up with either orthopedics or her primary care provider on Monday for reevaluation for treatment return here for any acute concerns or symptoms was explained this patient that she could not keep returning to the ER for narcotics for her chronic pain  MDM   Final diagnoses:  Arthritis  Chronic pain        Sury Wentworth Verdene Rio, PA-C 03/05/15 1619  Lisa Roca, MD 03/05/15 [phone]

## 2015-03-05 NOTE — ED Notes (Signed)
Pt to ED with c/o right knee pain that radiates up to right hip area, denies any injury, states pain started yesterday

## 2015-03-05 NOTE — Discharge Instructions (Signed)
Arthritis, Nonspecific °Arthritis is inflammation of a joint. This usually means pain, redness, warmth or swelling are present. One or more joints may be involved. There are a number of types of arthritis. Your caregiver may not be able to tell what type of arthritis you have right away. °CAUSES  °The most common cause of arthritis is the wear and tear on the joint (osteoarthritis). This causes damage to the cartilage, which can break down over time. The knees, hips, back and neck are most often affected by this type of arthritis. °Other types of arthritis and common causes of joint pain include: °· Sprains and other injuries near the joint. Sometimes minor sprains and injuries cause pain and swelling that develop hours later. °· Rheumatoid arthritis. This affects hands, feet and knees. It usually affects both sides of your body at the same time. It is often associated with chronic ailments, fever, weight loss and general weakness. °· Crystal arthritis. Gout and pseudo gout can cause occasional acute severe pain, redness and swelling in the foot, ankle, or knee. °· Infectious arthritis. Bacteria can get into a joint through a break in overlying skin. This can cause infection of the joint. Bacteria and viruses can also spread through the blood and affect your joints. °· Drug, infectious and allergy reactions. Sometimes joints can become mildly painful and slightly swollen with these types of illnesses. °SYMPTOMS  °· Pain is the main symptom. °· Your joint or joints can also be red, swollen and warm or hot to the touch. °· You may have a fever with certain types of arthritis, or even feel overall ill. °· The joint with arthritis will hurt with movement. Stiffness is present with some types of arthritis. °DIAGNOSIS  °Your caregiver will suspect arthritis based on your description of your symptoms and on your exam. Testing may be needed to find the type of arthritis: °· Blood and sometimes urine tests. °· X-ray tests  and sometimes CT or MRI scans. °· Removal of fluid from the joint (arthrocentesis) is done to check for bacteria, crystals or other causes. Your caregiver (or a specialist) will numb the area over the joint with a local anesthetic, and use a needle to remove joint fluid for examination. This procedure is only minimally uncomfortable. °· Even with these tests, your caregiver may not be able to tell what kind of arthritis you have. Consultation with a specialist (rheumatologist) may be helpful. °TREATMENT  °Your caregiver will discuss with you treatment specific to your type of arthritis. If the specific type cannot be determined, then the following general recommendations may apply. °Treatment of severe joint pain includes: °· Rest. °· Elevation. °· Anti-inflammatory medication (for example, ibuprofen) may be prescribed. Avoiding activities that cause increased pain. °· Only take over-the-counter or prescription medicines for pain and discomfort as recommended by your caregiver. °· Cold packs over an inflamed joint may be used for 10 to 15 minutes every hour. Hot packs sometimes feel better, but do not use overnight. Do not use hot packs if you are diabetic without your caregiver's permission. °· A cortisone shot into arthritic joints may help reduce pain and swelling. °· Any acute arthritis that gets worse over the next 1 to 2 days needs to be looked at to be sure there is no joint infection. °Long-term arthritis treatment involves modifying activities and lifestyle to reduce joint stress jarring. This can include weight loss. Also, exercise is needed to nourish the joint cartilage and remove waste. This helps keep the muscles   around the joint strong. °HOME CARE INSTRUCTIONS  °· Do not take aspirin to relieve pain if gout is suspected. This elevates uric acid levels. °· Only take over-the-counter or prescription medicines for pain, discomfort or fever as directed by your caregiver. °· Rest the joint as much as  possible. °· If your joint is swollen, keep it elevated. °· Use crutches if the painful joint is in your leg. °· Drinking plenty of fluids may help for certain types of arthritis. °· Follow your caregiver's dietary instructions. °· Try low-impact exercise such as: °¨ Swimming. °¨ Water aerobics. °¨ Biking. °¨ Walking. °· Morning stiffness is often relieved by a warm shower. °· Put your joints through regular range-of-motion. °SEEK MEDICAL CARE IF:  °· You do not feel better in 24 hours or are getting worse. °· You have side effects to medications, or are not getting better with treatment. °SEEK IMMEDIATE MEDICAL CARE IF:  °· You have a fever. °· You develop severe joint pain, swelling or redness. °· Many joints are involved and become painful and swollen. °· There is severe back pain and/or leg weakness. °· You have loss of bowel or bladder control. °Document Released: 11/16/2004 Document Revised: 01/01/2012 Document Reviewed: 12/02/2008 °[employer]® Patient Information ©2015 [employer]. This information is not intended to replace advice given to you by your health care provider. Make sure you discuss any questions you have with your health care provider. ° °

## 2015-03-22 IMAGING — CT CT HEAD WITHOUT CONTRAST
1 series · 16 of 30 positions shown, 20 images · non-contrast
Comparison: MRI brain 07/22/2012

CLINICAL DATA: Fall last week.  Scalp hematoma.  Dizziness.

EXAM:
CT HEAD WITHOUT CONTRAST
TECHNIQUE: Contiguous axial images were obtained from the base of the skull
through the vertex without intravenous contrast.

[Series 2: head wo · axial · 0.42mm/px · z∈[-47,+79]mm · 16 of 32 slices shown, 20 images]
[im 2/32  brain]
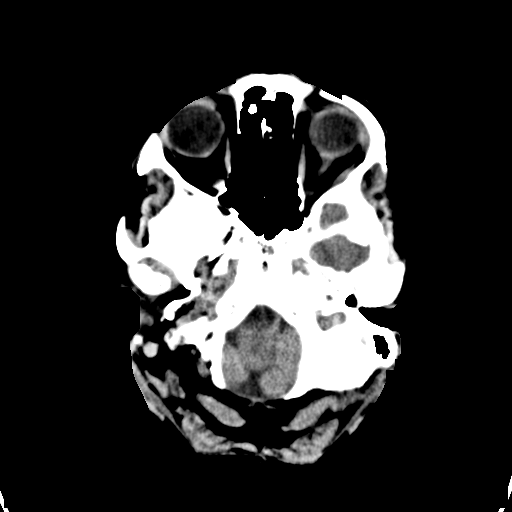
[im 2/32  bone]
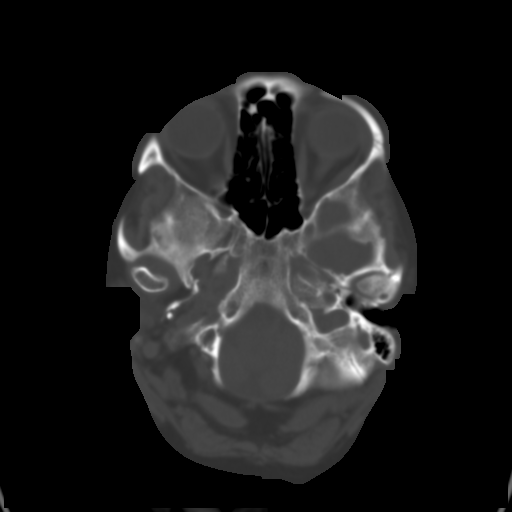
[im 4/32  brain]
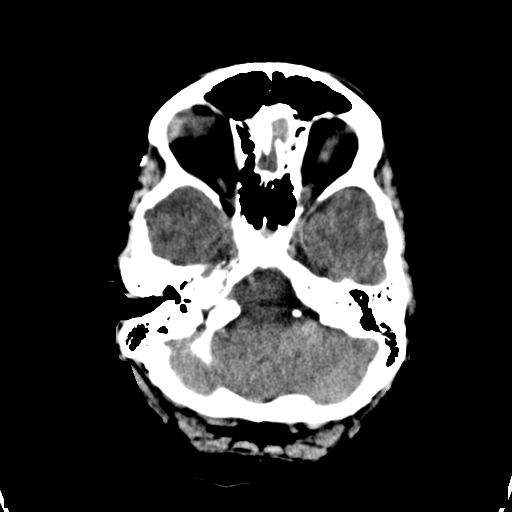
[im 6/32  brain]
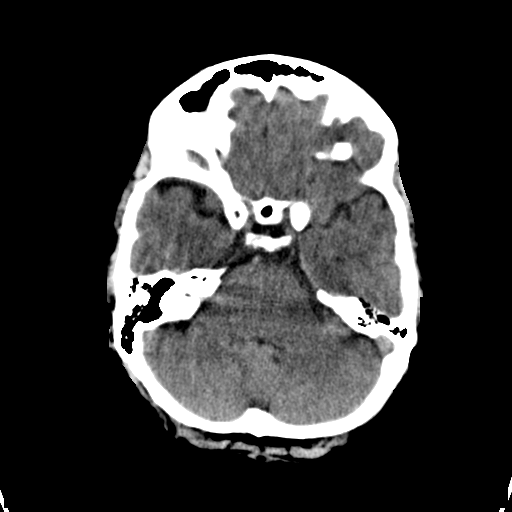
[im 8/32  brain]
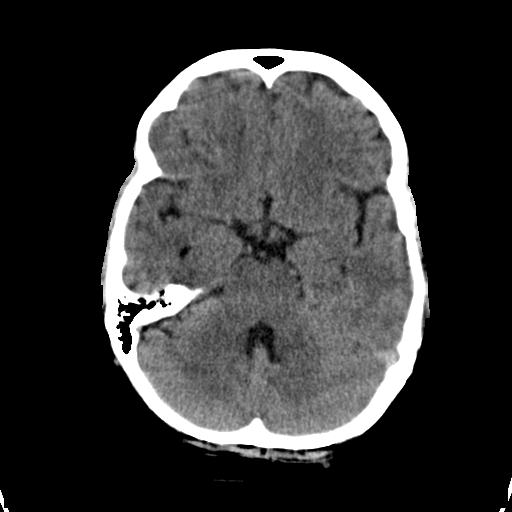
[im 9/32  brain]
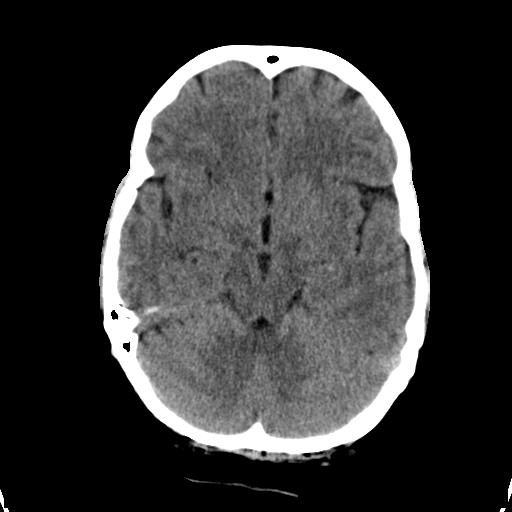
[im 9/32  bone]
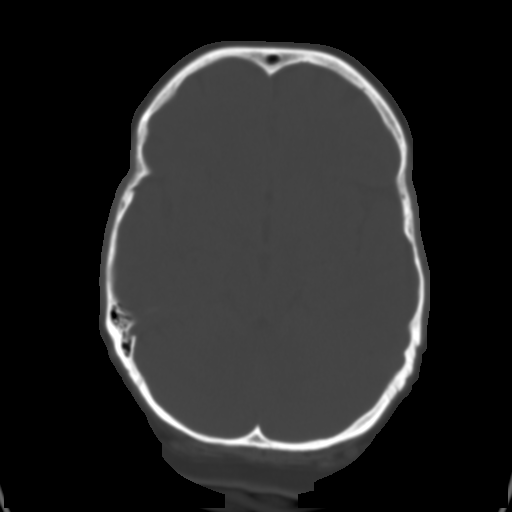
[im 11/32  brain]
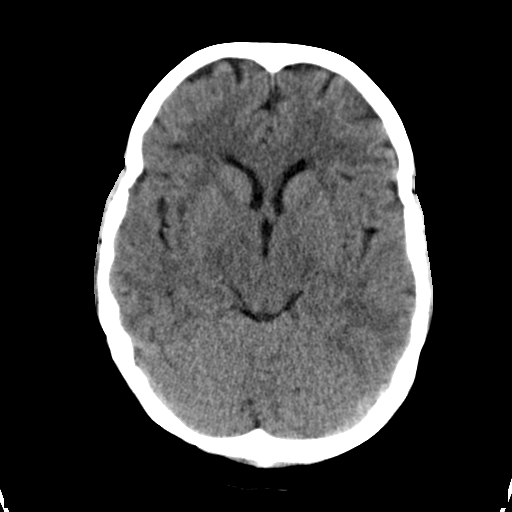
[im 13/32  brain]
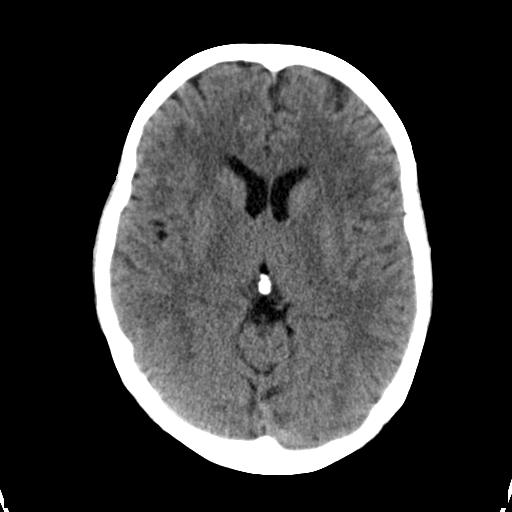
[im 15/32  brain]
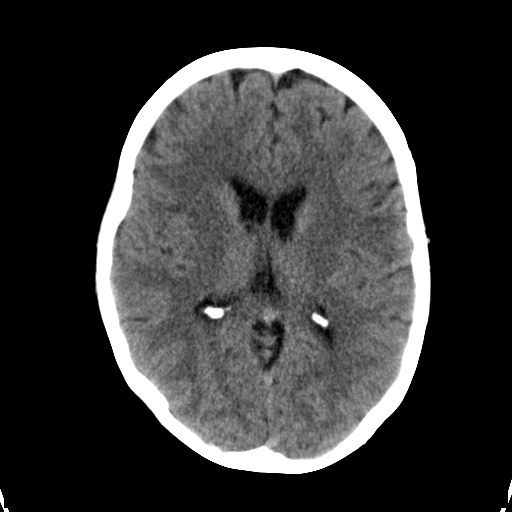
[im 17/32  brain]
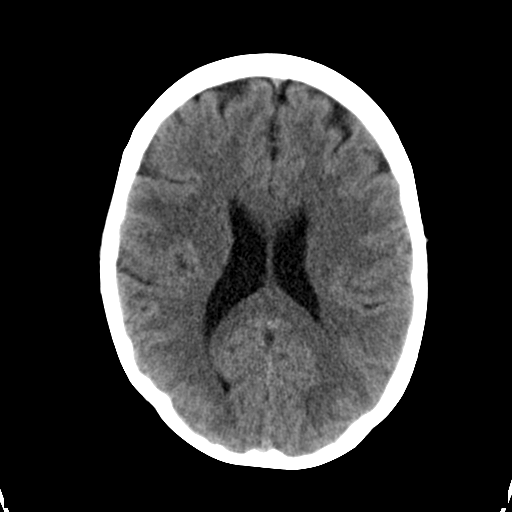
[im 17/32  bone]
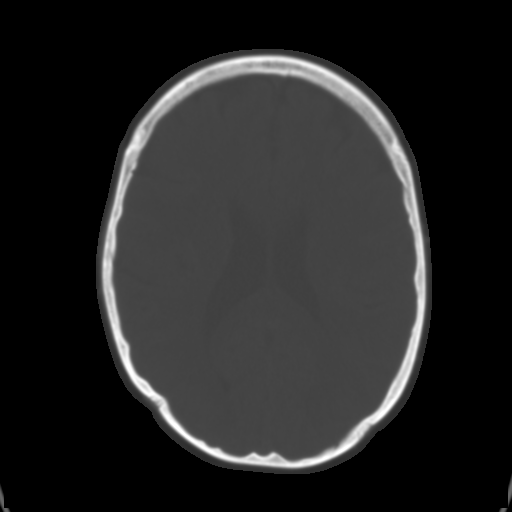
[im 19/32  brain]
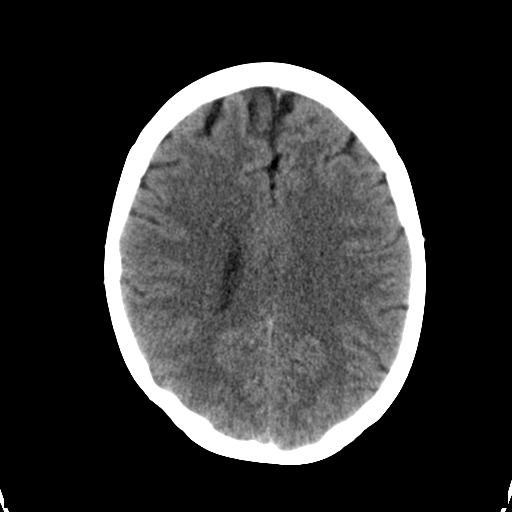
[im 21/32  brain]
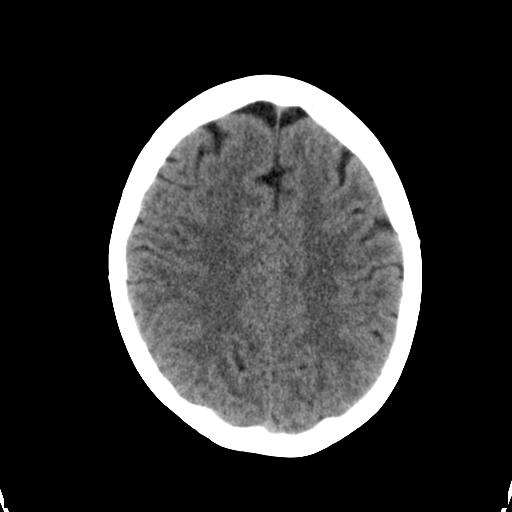
[im 23/32  brain]
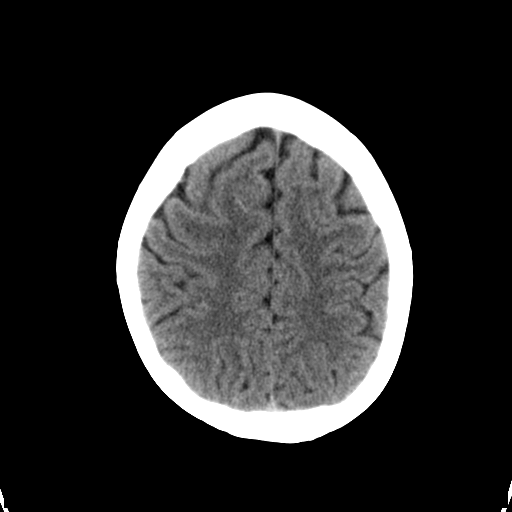
[im 24/32  brain]
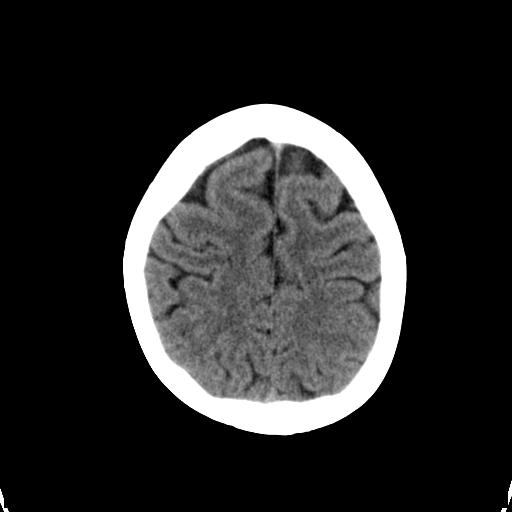
[im 24/32  bone]
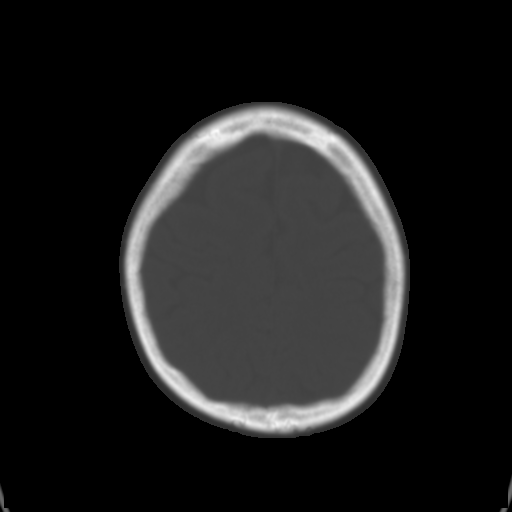
[im 26/32  brain]
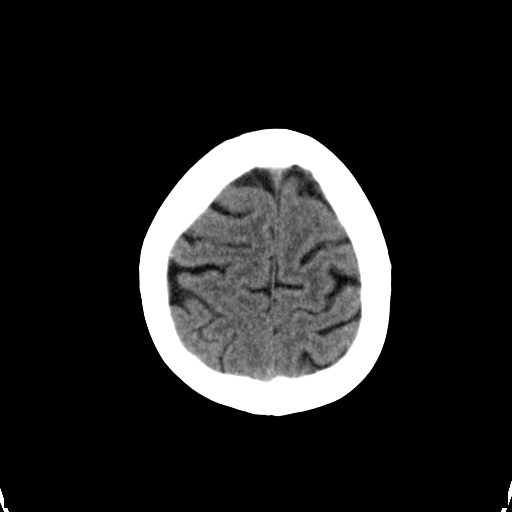
[im 28/32  brain]
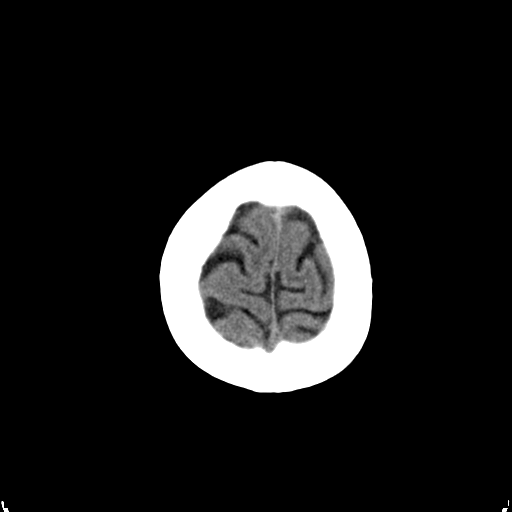
[im 30/32  brain]
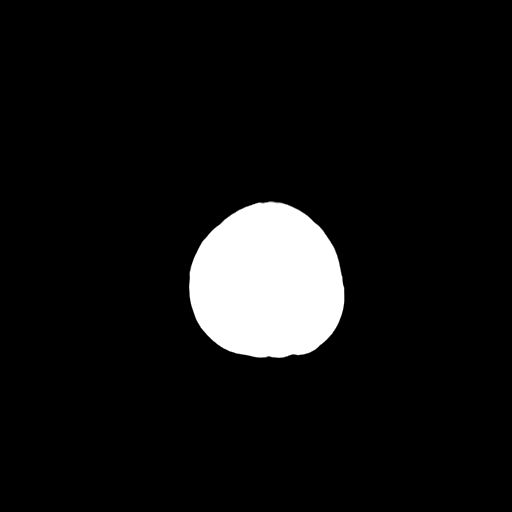

[16 of 30 positions shown; findings below may reference images not displayed]

FINDINGS: No acute cortical infarct, hemorrhage, or mass lesion is present.
The ventricles are of normal size. No significant extra-axial fluid
collection is evident. The paranasal sinuses and mastoid air cells
are clear. The osseous skull is intact.
IMPRESSION: Negative CT of the head.

## 2015-04-07 ENCOUNTER — Emergency Department: Payer: Self-pay

## 2015-04-07 ENCOUNTER — Emergency Department
Admission: EM | Admit: 2015-04-07 | Discharge: 2015-04-07 | Disposition: A | Discharge status: Home / Self Care | Payer: Self-pay | Attending: Student | Admitting: Student

## 2015-04-07 DIAGNOSIS — Z79899 Other long term (current) drug therapy: Secondary | ICD-10-CM | POA: Insufficient documentation

## 2015-04-07 DIAGNOSIS — M545 Low back pain, unspecified: Secondary | ICD-10-CM

## 2015-04-07 DIAGNOSIS — M25552 Pain in left hip: Secondary | ICD-10-CM | POA: Insufficient documentation

## 2015-04-07 DIAGNOSIS — Z7982 Long term (current) use of aspirin: Secondary | ICD-10-CM | POA: Insufficient documentation

## 2015-04-07 DIAGNOSIS — Z791 Long term (current) use of non-steroidal anti-inflammatories (NSAID): Secondary | ICD-10-CM | POA: Insufficient documentation

## 2015-04-07 DIAGNOSIS — I10 Essential (primary) hypertension: Secondary | ICD-10-CM | POA: Insufficient documentation

## 2015-04-07 DIAGNOSIS — Z720 Tobacco use: Secondary | ICD-10-CM | POA: Insufficient documentation

## 2015-04-07 MED ORDER — KETOROLAC TROMETHAMINE 60 MG/2ML IM SOLN
60.0000 mg | Freq: Once | INTRAMUSCULAR | Status: AC
  Administered 2015-04-07: 60 mg via INTRAMUSCULAR

## 2015-04-07 MED ORDER — ORPHENADRINE CITRATE 30 MG/ML IJ SOLN
60.0000 mg | INTRAMUSCULAR | Status: AC
  Administered 2015-04-07: 60 mg via INTRAMUSCULAR

## 2015-04-07 MED ORDER — ORPHENADRINE CITRATE ER 100 MG PO TB12: 100.0000 mg | ORAL_TABLET | Freq: Two times a day (BID) | ORAL | Status: DC | PRN

## 2015-04-07 MED ORDER — KETOROLAC TROMETHAMINE 10 MG PO TABS: 10.0000 mg | ORAL_TABLET | Freq: Three times a day (TID) | ORAL | Status: DC

## 2015-04-07 NOTE — ED Notes (Signed)
Patient transported to X-ray 

## 2015-04-07 NOTE — ED Provider Notes (Signed)
[facility] Emergency Department Provider Note ____________________________________________  Time seen: 1130  I have reviewed the triage vital signs and the nursing notes.  HISTORY  Chief Complaint Hip Pain  HPI Carla [name] is a 54 y.o. female reports to the ED for worsening of her chronic left hip pain over the last 3 days. She denies injury, trauma, fall, or accident. She describesas a 8 out of 10 in triage. She also gives a history of chronic degenerative joint disease in the bilateral knees, and speculates she may have the same disease in her hips.  Past Medical History  Diagnosis Date  . Hypertension   . Hyperlipidemia   . GERD (gastroesophageal reflux disease)   . Bell's palsy   . Morbid obesity with BMI of 40.0-44.9, adult     Patient Active Problem List   Diagnosis Date Noted  . Chest pain 08/06/2012    Past Surgical History  Procedure Laterality Date  . Cholecystectomy    . Vaginal hysterectomy      Current Outpatient Rx  Name  Route  Sig  Dispense  Refill  . aspirin 325 MG tablet   Oral   Take 325 mg by mouth daily.         [name] atorvastatin (LIPITOR) 40 MG tablet   Oral   Take 40 mg by mouth daily.         . citalopram (CELEXA) 20 MG tablet   Oral   Take 20 mg by mouth daily.         [name] ketorolac (TORADOL) 10 MG tablet   Oral   Take 1 tablet (10 mg total) by mouth every 8 (eight) hours.   15 tablet   0   . meloxicam (MOBIC) 7.5 MG tablet   Oral   Take 1 tablet (7.5 mg total) by mouth daily.   30 tablet   0   . metoprolol tartrate (LOPRESSOR) 25 MG tablet   Oral   Take 25 mg by mouth 2 (two) times daily.         . orphenadrine (NORFLEX) 100 MG tablet   Oral   Take 1 tablet (100 mg total) by mouth 2 (two) times daily as needed for muscle spasms.   20 tablet   0   . traMADol (ULTRAM) 50 MG tablet   Oral   Take 1 tablet (50 mg total) by mouth every 6 (six) hours as needed.   12 tablet   0      Allergies Review of patient's allergies indicates no known allergies.  Family History  Problem Relation Age of Onset  . Hypertension Mother   . Heart disease Mother   . Heart attack Mother   . Hypertension Father     Social History History  Substance Use Topics  . Smoking status: Current Every Day Smoker    Types: Cigarettes  . Smokeless tobacco: Not on file  . Alcohol Use: No   Review of Systems  Constitutional: Negative for fever. Eyes: Negative for visual changes. ENT: Negative for sore throat. Cardiovascular: Negative for chest pain. Respiratory: Negative for shortness of breath. Gastrointestinal: Negative for abdominal pain, vomiting and diarrhea. Genitourinary: Negative for dysuria. Musculoskeletal: Negative for back pain. Left hip pain, chronically. Skin: Negative for rash. Neurological: Negative for headaches, focal weakness or numbness. ____________________________________________  PHYSICAL EXAM:  VITAL SIGNS: ED Triage Vitals  Enc Vitals Group     BP 04/07/15 0950 186/88 mmHg     Pulse Rate 04/07/15 0950  66     Resp 04/07/15 0950 18     Temp 04/07/15 0950 97.8 F (36.6 C)     Temp Source 04/07/15 0950 Oral     SpO2 04/07/15 0950 98 %     Weight 04/07/15 0950 225 lb (102.059 kg)     Height 04/07/15 0950 5\' 3"  (1.6 m)     Head Cir --      Peak Flow --      Pain Score 04/07/15 0954 8     Pain Loc --      Pain Edu? --      Excl. in [place]? --    Constitutional: Alert and oriented. Well appearing and in no distress. Eyes: Conjunctivae are normal. PERRL. Normal extraocular movements. ENT   Head: Normocephalic and atraumatic.   Nose: No congestion/rhinnorhea.   Mouth/Throat: Mucous membranes are moist.   Neck: No stridor. Cardiovascular: Normal rate, regular rhythm.  Respiratory: Normal respiratory effort. No wheezes/rales/rhonchi. Gastrointestinal: Soft and nontender. No distention. Musculoskeletal: Nontender with normal range of  motion in all extremities. Left hip with normal and full passive ROM without catch, click, lock, or giveway. No lower extremity tenderness nor edema. Neurologic:  Normal speech and language. No gross focal neurologic deficits are appreciated. CN II_XII grossly intact. Normal lumbar SLR bilateral Skin:  Skin is warm, dry and intact. No rash noted. Psychiatric: Mood and affect are normal. Patient exhibits appropriate insight and judgment. ___________________________   RADIOLOGY Left Hip IMPRESSION: Negative. ____________________________________________  PROCEDURES Toradol 60 mg IM Norflex 60 mg IM ____________________________________________  INITIAL IMPRESSION / ASSESSMENT AND PLAN / ED COURSE  Acute on chronic left hip pain without radiologic evidence of degenerative joint disease or acute fracture. Radiology results to the patient and family. Referral to [facility] and Dr. Mack Guise a needed. Prescription Toradol and Norflex given. ____________________________________________  FINAL CLINICAL IMPRESSION(S) / ED DIAGNOSES  Final diagnoses:  Hip pain, acute, left  Lumbosacral pain     Melvenia Needles, PA-C 04/07/15 [place], [place] 04/13/15 [phone]

## 2015-04-07 NOTE — Discharge Instructions (Signed)
Arthralgia °Your caregiver has diagnosed you as suffering from an arthralgia. Arthralgia means there is pain in a joint. This can come from many reasons including: °· Bruising the joint which causes soreness (inflammation) in the joint. °· Wear and tear on the joints which occur as we grow older (osteoarthritis). °· Overusing the joint. °· Various forms of arthritis. °· Infections of the joint. °Regardless of the cause of pain in your joint, most of these different pains respond to anti-inflammatory drugs and rest. The exception to this is when a joint is infected, and these cases are treated with antibiotics, if it is a bacterial infection. °HOME CARE INSTRUCTIONS  °· Rest the injured area for as long as directed by your caregiver. Then slowly start using the joint as directed by your caregiver and as the pain allows. Crutches as directed may be useful if the ankles, knees or hips are involved. If the knee was splinted or casted, continue use and care as directed. If an stretchy or elastic wrapping bandage has been applied today, it should be removed and re-applied every 3 to 4 hours. It should not be applied tightly, but firmly enough to keep swelling down. Watch toes and feet for swelling, bluish discoloration, coldness, numbness or excessive pain. If any of these problems (symptoms) occur, remove the ace bandage and re-apply more loosely. If these symptoms persist, contact your caregiver or return to this location. °· For the first 24 hours, keep the injured extremity elevated on pillows while lying down. °· Apply ice for 15-20 minutes to the sore joint every couple hours while awake for the first half day. Then 03-04 times per day for the first 48 hours. Put the ice in a plastic bag and place a towel between the bag of ice and your skin. °· Wear any splinting, casting, elastic bandage applications, or slings as instructed. °· Only take over-the-counter or prescription medicines for pain, discomfort, or fever as  directed by your caregiver. Do not use aspirin immediately after the injury unless instructed by your physician. Aspirin can cause increased bleeding and bruising of the tissues. °· If you were given crutches, continue to use them as instructed and do not resume weight bearing on the sore joint until instructed. °Persistent pain and inability to use the sore joint as directed for more than 2 to 3 days are warning signs indicating that you should see a caregiver for a follow-up visit as soon as possible. Initially, a hairline fracture (break in bone) may not be evident on X-rays. Persistent pain and swelling indicate that further evaluation, non-weight bearing or use of the joint (use of crutches or slings as instructed), or further X-rays are indicated. X-rays may sometimes not show a small fracture until a week or 10 days later. Make a follow-up appointment with your own caregiver or one to whom we have referred you. A radiologist (specialist in reading X-rays) may read your X-rays. Make sure you know how you are to obtain your X-ray results. Do not assume everything is normal if you do not hear from us. °SEEK MEDICAL CARE IF: °Bruising, swelling, or pain increases. °SEEK IMMEDIATE MEDICAL CARE IF:  °· Your fingers or toes are numb or blue. °· The pain is not responding to medications and continues to stay the same or get worse. °· The pain in your joint becomes severe. °· You develop a fever over 102° F (38.9° C). °· It becomes impossible to move or use the joint. °MAKE SURE YOU:  °·   Understand these instructions.  Will watch your condition.  Will get help right away if you are not doing well or get worse. Document Released: 10/09/2005 Document Revised: 01/01/2012 Document Reviewed: 05/27/2008 [facility] Patient Information 2015 [place], [place]. This information is not intended to replace advice given to you by your health care provider. Make sure you discuss any questions you have with your health care  provider.  Back Pain, Adult Back pain is very common. The pain often gets better over time. The cause of back pain is usually not dangerous. Most people can learn to manage their back pain on their own.  HOME CARE   Stay active. Start with short walks on flat ground if you can. Try to walk farther each day.  Do not sit, drive, or stand in one place for more than 30 minutes. Do not stay in bed.  Do not avoid exercise or work. Activity can help your back heal faster.  Be careful when you bend or lift an object. Bend at your knees, keep the object close to you, and do not twist.  Sleep on a firm mattress. Lie on your side, and bend your knees. If you lie on your back, put a pillow under your knees.  Only take medicines as told by your doctor.  Put ice on the injured area.  Put ice in a plastic bag.  Place a towel between your skin and the bag.  Leave the ice on for 15-20 minutes, 03-04 times a day for the first 2 to 3 days. After that, you can switch between ice and heat packs.  Ask your doctor about back exercises or massage.  Avoid feeling anxious or stressed. Find good ways to deal with stress, such as exercise. GET HELP RIGHT AWAY IF:   Your pain does not go away with rest or medicine.  Your pain does not go away in 1 week.  You have new problems.  You do not feel well.  The pain spreads into your legs.  You cannot control when you poop (bowel movement) or pee (urinate).  Your arms or legs feel weak or lose feeling (numbness).  You feel sick to your stomach (nauseous) or throw up (vomit).  You have belly (abdominal) pain.  You feel like you may pass out (faint). MAKE SURE YOU:   Understand these instructions.  Will watch your condition.  Will get help right away if you are not doing well or get worse. Document Released: 03/27/2008 Document Revised: 01/01/2012 Document Reviewed: 02/10/2014 [facility] Patient Information 2015 [place], [place]. This information is  not intended to replace advice given to you by your health care provider. Make sure you discuss any questions you have with your health care provider.  Take the prescription meds as directed.  HOLD your Meloxicam while you are taking the Ketorolac.  Follow-up with [facility] or Dr. Mack Guise for ongoing symptoms.

## 2015-04-07 NOTE — ED Notes (Signed)
Patient arrives to [facility] ED ambulatory via POV with c/o non-traumatic hip pain. Patient states she has had pain like this before but not this bad. Patient has not seen an MD for this issue

## 2015-04-09 ENCOUNTER — Emergency Department: Payer: Self-pay

## 2015-04-09 ENCOUNTER — Other Ambulatory Visit: Payer: Self-pay

## 2015-04-09 ENCOUNTER — Emergency Department
Admission: EM | Admit: 2015-04-09 | Discharge: 2015-04-09 | Disposition: A | Discharge status: Home / Self Care | Payer: Self-pay | Attending: Emergency Medicine | Admitting: Emergency Medicine

## 2015-04-09 ENCOUNTER — Encounter: Payer: Self-pay | Admitting: Emergency Medicine

## 2015-04-09 DIAGNOSIS — Z7982 Long term (current) use of aspirin: Secondary | ICD-10-CM | POA: Insufficient documentation

## 2015-04-09 DIAGNOSIS — R109 Unspecified abdominal pain: Secondary | ICD-10-CM

## 2015-04-09 DIAGNOSIS — M1612 Unilateral primary osteoarthritis, left hip: Secondary | ICD-10-CM | POA: Insufficient documentation

## 2015-04-09 DIAGNOSIS — Z9119 Patient's noncompliance with other medical treatment and regimen: Secondary | ICD-10-CM | POA: Insufficient documentation

## 2015-04-09 DIAGNOSIS — N200 Calculus of kidney: Secondary | ICD-10-CM | POA: Insufficient documentation

## 2015-04-09 DIAGNOSIS — Z720 Tobacco use: Secondary | ICD-10-CM | POA: Insufficient documentation

## 2015-04-09 DIAGNOSIS — Z79899 Other long term (current) drug therapy: Secondary | ICD-10-CM | POA: Insufficient documentation

## 2015-04-09 DIAGNOSIS — I10 Essential (primary) hypertension: Secondary | ICD-10-CM | POA: Insufficient documentation

## 2015-04-09 LAB — CBC
HCT: 45.8 % (ref 35.0–47.0)
Hemoglobin: 15.1 g/dL (ref 12.0–16.0)
MCH: 29.8 pg (ref 26.0–34.0)
MCHC: 33 g/dL (ref 32.0–36.0)
MCV: 90.2 fL (ref 80.0–100.0)
Platelets: 289 10*3/uL (ref 150–440)
RBC: 5.07 MIL/uL (ref 3.80–5.20)
RDW: 14.8 % — ABNORMAL HIGH (ref 11.5–14.5)
WBC: 10.5 10*3/uL (ref 3.6–11.0)

## 2015-04-09 LAB — URINALYSIS COMPLETE WITH MICROSCOPIC (ARMC ONLY)
Bilirubin Urine: NEGATIVE
Glucose, UA: NEGATIVE mg/dL
Ketones, ur: NEGATIVE mg/dL
Leukocytes, UA: NEGATIVE
Nitrite: POSITIVE — AB
Protein, ur: 100 mg/dL — AB
Specific Gravity, Urine: 1.025 (ref 1.005–1.030)
pH: 5 (ref 5.0–8.0)

## 2015-04-09 LAB — COMPREHENSIVE METABOLIC PANEL
ALT: 12 U/L — ABNORMAL LOW (ref 14–54)
AST: 13 U/L — ABNORMAL LOW (ref 15–41)
Albumin: 4.1 g/dL (ref 3.5–5.0)
Alkaline Phosphatase: 97 U/L (ref 38–126)
Anion gap: 13 (ref 5–15)
BUN: 11 mg/dL (ref 6–20)
CO2: 24 mmol/L (ref 22–32)
Calcium: 9.4 mg/dL (ref 8.9–10.3)
Chloride: 106 mmol/L (ref 101–111)
Creatinine, Ser: 0.93 mg/dL (ref 0.44–1.00)
GFR calc Af Amer: >60 mL/min (ref >60)
GFR calc non Af Amer: >60 mL/min (ref >60)
Glucose, Bld: 116 mg/dL — ABNORMAL HIGH (ref 65–99)
Potassium: 3.6 mmol/L (ref 3.5–5.1)
Sodium: 143 mmol/L (ref 135–145)
Total Bilirubin: 0.4 mg/dL (ref 0.3–1.2)
Total Protein: 7.7 g/dL (ref 6.5–8.1)

## 2015-04-09 LAB — LIPASE, BLOOD: Lipase: 42 U/L (ref 22–51)

## 2015-04-09 MED ORDER — MORPHINE SULFATE 4 MG/ML IJ SOLN
4.0000 mg | Freq: Once | INTRAMUSCULAR | Status: AC
  Administered 2015-04-09: 4 mg via INTRAVENOUS

## 2015-04-09 MED ORDER — SODIUM CHLORIDE 0.9 % IV BOLUS (SEPSIS)
1000.0000 mL | Freq: Once | INTRAVENOUS | Status: AC
  Administered 2015-04-09: 1000 mL via INTRAVENOUS

## 2015-04-09 MED ORDER — OXYCODONE-ACETAMINOPHEN 5-325 MG PO TABS: 1.0000 | ORAL_TABLET | Freq: Four times a day (QID) | ORAL | Status: DC | PRN

## 2015-04-09 MED ORDER — TAMSULOSIN HCL 0.4 MG PO CAPS: 0.4000 mg | ORAL_CAPSULE | Freq: Every day | ORAL | Status: DC

## 2015-04-09 MED ORDER — ONDANSETRON HCL 4 MG/2ML IJ SOLN
INTRAMUSCULAR | Status: AC
  Filled 2015-04-09: qty 2

## 2015-04-09 MED ORDER — ONDANSETRON 4 MG PO TBDP: 4.0000 mg | ORAL_TABLET | Freq: Four times a day (QID) | ORAL | Status: DC | PRN

## 2015-04-09 MED ORDER — CEPHALEXIN 500 MG PO CAPS: 500.0000 mg | ORAL_CAPSULE | Freq: Four times a day (QID) | ORAL | Status: DC

## 2015-04-09 MED ORDER — KETOROLAC TROMETHAMINE 30 MG/ML IJ SOLN
INTRAMUSCULAR | Status: AC
Start: 2015-04-09 — End: 2015-04-09
  Administered 2015-04-09: 30 mg
  Filled 2015-04-09: qty 1

## 2015-04-09 MED ORDER — ONDANSETRON HCL 4 MG/2ML IJ SOLN
4.0000 mg | INTRAMUSCULAR | Status: AC
  Administered 2015-04-09: 4 mg via INTRAVENOUS

## 2015-04-09 MED ORDER — ONDANSETRON HCL 4 MG/2ML IJ SOLN
INTRAMUSCULAR | Status: AC
Start: 2015-04-09 — End: 2015-04-09
  Administered 2015-04-09: 4 mg
  Filled 2015-04-09: qty 2

## 2015-04-09 MED ORDER — MORPHINE SULFATE 4 MG/ML IJ SOLN
INTRAMUSCULAR | Status: AC
  Administered 2015-04-09: 4 mg via INTRAVENOUS
  Filled 2015-04-09: qty 1

## 2015-04-09 MED ORDER — METOCLOPRAMIDE HCL 5 MG/ML IJ SOLN
10.0000 mg | Freq: Once | INTRAMUSCULAR | Status: AC
  Administered 2015-04-09: 10 mg via INTRAVENOUS

## 2015-04-09 MED ORDER — METOCLOPRAMIDE HCL 5 MG/ML IJ SOLN
INTRAMUSCULAR | Status: AC
  Administered 2015-04-09: 10 mg via INTRAVENOUS
  Filled 2015-04-09: qty 2

## 2015-04-09 MED ORDER — MORPHINE SULFATE 4 MG/ML IJ SOLN
INTRAMUSCULAR | Status: DC
Start: 2015-04-09 — End: 2015-04-09
  Filled 2015-04-09: qty 1

## 2015-04-09 NOTE — ED Provider Notes (Signed)
[facility] Emergency Department Provider Note  ____________________________________________  Time seen: Approximately 4:17 PM  I have reviewed the triage vital signs and the nursing notes.   HISTORY  Chief Complaint Flank Pain    HPI Carla [name] is a 54 y.o. female who about 1-1/2 hours before arrival had sudden severe left back pain. She states she began having severe sharp pain in her left back, with nausea and vomiting because it was so painful. She rates the pain as 10 out of 10, sharp, and severe. Associated symptoms include some dark or blood in her urine over the last hour.  Patient has a history of cholecystectomy, appendectomy, and hysterectomy. She is a smoker. She does not use any alcohol.    Past Medical History  Diagnosis Date  . Hypertension   . Hyperlipidemia   . GERD (gastroesophageal reflux disease)   . Bell's palsy   . Morbid obesity with BMI of 40.0-44.9, adult     Patient Active Problem List   Diagnosis Date Noted  . Chest pain 08/06/2012    Past Surgical History  Procedure Laterality Date  . Cholecystectomy    . Vaginal hysterectomy      Current Outpatient Rx  Name  Route  Sig  Dispense  Refill  . aspirin EC 81 MG tablet   Oral   Take 81 mg by mouth daily.         [name] atorvastatin (LIPITOR) 40 MG tablet   Oral   Take 40 mg by mouth at bedtime.          . citalopram (CELEXA) 20 MG tablet   Oral   Take 20 mg by mouth daily.         . metoprolol tartrate (LOPRESSOR) 25 MG tablet   Oral   Take 25 mg by mouth 2 (two) times daily.         . cephALEXin (KEFLEX) 500 MG capsule   Oral   Take 1 capsule (500 mg total) by mouth 4 (four) times daily.   40 capsule   0   . ketorolac (TORADOL) 10 MG tablet   Oral   Take 1 tablet (10 mg total) by mouth every 8 (eight) hours. Patient not taking: Reported on 04/09/2015   15 tablet   0   . meloxicam (MOBIC) 7.5 MG tablet   Oral   Take 1 tablet (7.5 mg total) by  mouth daily. Patient not taking: Reported on 04/09/2015   30 tablet   0   . ondansetron (ZOFRAN ODT) 4 MG disintegrating tablet   Oral   Take 1 tablet (4 mg total) by mouth every 6 (six) hours as needed for nausea or vomiting.   20 tablet   0   . orphenadrine (NORFLEX) 100 MG tablet   Oral   Take 1 tablet (100 mg total) by mouth 2 (two) times daily as needed for muscle spasms. Patient not taking: Reported on 04/09/2015   20 tablet   0   . oxyCODONE-acetaminophen (ROXICET) 5-325 MG per tablet   Oral   Take 1 tablet by mouth every 6 (six) hours as needed for severe pain.   20 tablet   0   . tamsulosin (FLOMAX) 0.4 MG CAPS capsule   Oral   Take 1 capsule (0.4 mg total) by mouth daily.   7 capsule   0   . traMADol (ULTRAM) 50 MG tablet   Oral   Take 1 tablet (50 mg total) by mouth  every 6 (six) hours as needed. Patient not taking: Reported on 04/09/2015   12 tablet   0     Allergies Review of patient's allergies indicates no known allergies.  Family History  Problem Relation Age of Onset  . Hypertension Mother   . Heart disease Mother   . Heart attack Mother   . Hypertension Father     Social History History  Substance Use Topics  . Smoking status: Current Every Day Smoker    Types: Cigarettes  . Smokeless tobacco: Not on file  . Alcohol Use: No    Review of Systems Constitutional: No fever/chills Eyes: No visual changes. ENT: No sore throat. Cardiovascular: Denies chest pain. Respiratory: Denies shortness of breath. Gastrointestinal: See history of present illness.  No diarrhea.  No constipation. Genitourinary: Negative for dysuria. Musculoskeletal: Negative for back pain. Patient has been experiencing some achy pain in the area of her left hip recently, but states she has "arthritis there". Skin: Negative for rash. Neurological: Negative for headaches, focal weakness or numbness.  10-point ROS otherwise  negative.  ____________________________________________   PHYSICAL EXAM:  VITAL SIGNS: ED Triage Vitals  Enc Vitals Group     BP 04/09/15 1501 196/124 mmHg     Pulse Rate 04/09/15 1501 92     Resp 04/09/15 1501 24     Temp 04/09/15 1501 97.7 F (36.5 C)     Temp Source 04/09/15 1501 Oral     SpO2 04/09/15 1501 99 %     Weight 04/09/15 1501 225 lb (102.059 kg)     Height 04/09/15 1501 5\' 3"  (1.6 m)     Head Cir --      Peak Flow --      Pain Score 04/09/15 1502 10     Pain Loc --      Pain Edu? --      Excl. in [place]? --     Constitutional: Alert and oriented. He isn't sitting on edge of bed vomiting, and severe pain. She does appear to be in extremis due to pain. Eyes: Conjunctivae are normal. PERRL. EOMI. Head: Atraumatic. Nose: No congestion/rhinnorhea. Mouth/Throat: Mucous membranes are moist.  Oropharynx non-erythematous. Neck: No stridor.   Cardiovascular: Slightly tachycardic, regular. Grossly normal heart sounds.  Good peripheral circulation. Respiratory: Normal respiratory effort.  No retractions. Lungs CTAB. Gastrointestinal: She has significant left-sided CVA tenderness. She also has moderate tenderness in the left flank. No right sided abdominal pain. No epigastric pain. No distention. No abdominal bruits.  Musculoskeletal: No lower extremity tenderness nor edema.  No joint effusions. Neurologic:  Normal speech and language. No gross focal neurologic deficits are appreciated. Speech is normal. No gait instability. Skin:  Skin is warm, dry and intact. No rash noted. Psychiatric: Mood and affect are normal. Speech and behavior are normal.  ____________________________________________   LABS (all labs ordered are listed, but only abnormal results are displayed)  Labs Reviewed  CBC - Abnormal; Notable for the following:    RDW 14.8 (*)    All other components within normal limits  COMPREHENSIVE METABOLIC PANEL - Abnormal; Notable for the following:    Glucose,  Bld 116 (*)    AST 13 (*)    ALT 12 (*)    All other components within normal limits  URINALYSIS COMPLETEWITH MICROSCOPIC ([facility] ONLY) - Abnormal; Notable for the following:    Color, Urine AMBER (*)    APPearance CLOUDY (*)    Hgb urine dipstick 3+ (*)    Protein,  ur 100 (*)    Nitrite POSITIVE (*)    Bacteria, UA RARE (*)    Squamous Epithelial / LPF 0-5 (*)    All other components within normal limits  LIPASE, BLOOD   ____________________________________________  EKG  ED ECG REPORT I, QUALE, MARK, the attending physician, personally viewed and interpreted this ECG.  Date: 04/09/2015 EKG Time: 1642 Rate: 85 Rhythm: normal sinus rhythm QRS Axis: normal Intervals: normal ST/T Wave abnormalities: normal Conduction Disutrbances: none Narrative Interpretation: unremarkable  ____________________________________________  RADIOLOGY  CT RENAL STONE STUDY (Final result) Result time: 04/09/15 16:33:16   Final result by Rad Results In Interface (04/09/15 16:33:16)   Narrative:   CLINICAL DATA: Left-sided abdominal and pelvic pain beginning 2 hours ago, nausea and vomiting  EXAM: CT ABDOMEN AND PELVIS WITHOUT CONTRAST  TECHNIQUE: Multidetector CT imaging of the abdomen and pelvis was performed following the standard protocol without IV contrast.  COMPARISON: 01/19/2015  FINDINGS: Lower chest: Lung bases are clear.  Hepatobiliary: Cholecystectomy clips are noted. Unenhanced liver is unremarkable.  Pancreas: Normal  Spleen: Normal  Adrenals/Urinary Tract: Subjective nodularity of both adrenal glands, left greater than right, without measurable mass. 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 There is new moderate left hydroureteronephrosis to the level of a 3 mm proximal left ureteral calculus image 40, with mild very ureteral stranding. Distal ureteral decompression is identified. The bladder is unremarkable. No right renal or ureteral  radiopaque calculus.  Stomach/Bowel: No bowel wall thickening or focal segmental dilatation is identified.  Vascular/Lymphatic: Mild atheromatous aortic calcification without aneurysm. No lymphadenopathy.  Other: Right ovary appears unremarkable, with tubal ligation clips in place. Left ovary and uterus presumed surgically absent.  No free air or fluid.  Musculoskeletal: No acute osseous abnormality. Mild lower lumbar spine disc degenerative change.  IMPRESSION: 3 mm proximal left ureteral calculus producing moderate left hydroureteronephrosis.    ____________________________________________   PROCEDURES  Procedure(s) performed: None  Critical Care performed: No  ____________________________________________   INITIAL IMPRESSION / ASSESSMENT AND PLAN / ED COURSE  Pertinent labs & imaging results that were available during my care of the patient were reviewed by me and considered in my medical decision making (see chart for details).  Sudden onset severe left flank pain. Patient reports associated hematuria and pain over her left side with significant left-sided CVA tenderness. Based on her history, nephrolithiasis is certainly high on the differential. She's had an appendectomy and cholecystectomy. Other considerations would be perforated ulcer, unlikely but not impossible AAA, or other acute etiology. No cardiopulmonary symptoms. We will obtain an EKG.  ----------------------------------------- 4:23 PM on 04/09/2015 -----------------------------------------    Patient's lab. She has hematuria. Her pain is improving but she still feels nauseated and is still in some pain. We will continue to treat her for both. We await CT scan, based on her significant her hematuria I expect nephrolithiasis but we await CT.  ----------------------------------------- 4:45 PM on 04/09/2015 -----------------------------------------  CT of affirms clinical diagnosis. Left-sided  hydronephrosis with left ureteral calculus. ____________________________________________ ----------------------------------------- 5:37 PM on 04/09/2015 -----------------------------------------  Patient appears much improved. She is resting comfortably. Fully alert and conversant. States her pain and nausea are much improved. She appears stable at this time. There is no evidence to suggest she has a complicated or infected stone. Normal white count and afebrile. She has many red cells in her urine, this accompanied with a few white cells and is likely due to her kidney stone not represent infection. Because there are some  white cells present I we'll place her on cephalexin for the next 10 days. I discuss careful return precautions with the patient.  She is not driving herself home. We discussed safe use of oxycodone. The patient is quite agreeable. She agrees to only use as prescribed and not drivable taking.  Discharge to home. Left-sided kidney stone. Following up with urology.  FINAL CLINICAL IMPRESSION(S) / ED DIAGNOSES  Final diagnoses:  Left flank pain  Kidney stone on left side      Delman Kitten, MD 04/09/15 1746

## 2015-04-09 NOTE — Discharge Instructions (Signed)
Kidney Stones  Follow-up with urology. No driving today or while using oxycodone. Please take only as prescribed and never more than 1 tablet every 6 hours. Return to the ER right away if you develop a fever, severe abdominal pain, or vomiting again, or other new concerns or symptoms arise.  Kidney stones (urolithiasis) are deposits that form inside your kidneys. The intense pain is caused by the stone moving through the urinary tract. When the stone moves, the ureter goes into spasm around the stone. The stone is usually passed in the urine.  CAUSES   A disorder that makes certain neck glands produce too much parathyroid hormone (primary hyperparathyroidism).  A buildup of uric acid crystals, similar to gout in your joints.  Narrowing (stricture) of the ureter.  A kidney obstruction present at birth (congenital obstruction).  Previous surgery on the kidney or ureters.  Numerous kidney infections. SYMPTOMS   Feeling sick to your stomach (nauseous).  Throwing up (vomiting).  Blood in the urine (hematuria).  Pain that usually spreads (radiates) to the groin.  Frequency or urgency of urination. DIAGNOSIS   Taking a history and physical exam.  Blood or urine tests.  CT scan.  Occasionally, an examination of the inside of the urinary bladder (cystoscopy) is performed. TREATMENT   Observation.  Increasing your fluid intake.  Extracorporeal shock wave lithotripsy--This is a noninvasive procedure that uses shock waves to break up kidney stones.  Surgery may be needed if you have severe pain or persistent obstruction. There are various surgical procedures. Most of the procedures are performed with the use of small instruments. Only small incisions are needed to accommodate these instruments, so recovery time is minimized. The size, location, and chemical composition are all important variables that will determine the proper choice of action for you. Talk to your health care  provider to better understand your situation so that you will minimize the risk of injury to yourself and your kidney.  HOME CARE INSTRUCTIONS   Drink enough water and fluids to keep your urine clear or pale yellow. This will help you to pass the stone or stone fragments.  Strain all urine through the provided strainer. Keep all particulate matter and stones for your health care provider to see. The stone causing the pain may be as small as a grain of salt. It is very important to use the strainer each and every time you pass your urine. The collection of your stone will allow your health care provider to analyze it and verify that a stone has actually passed. The stone analysis will often identify what you can do to reduce the incidence of recurrences.  Only take over-the-counter or prescription medicines for pain, discomfort, or fever as directed by your health care provider.  Make a follow-up appointment with your health care provider as directed.  Get follow-up X-rays if required. The absence of pain does not always mean that the stone has passed. It may have only stopped moving. If the urine remains completely obstructed, it can cause loss of kidney function or even complete destruction of the kidney. It is your responsibility to make sure X-rays and follow-ups are completed. Ultrasounds of the kidney can show blockages and the status of the kidney. Ultrasounds are not associated with any radiation and can be performed easily in a matter of minutes. SEEK MEDICAL CARE IF:  You experience pain that is progressive and unresponsive to any pain medicine you have been prescribed. SEEK IMMEDIATE MEDICAL CARE IF:  Pain cannot be controlled with the prescribed medicine.  You have a fever or shaking chills.  The severity or intensity of pain increases over 18 hours and is not relieved by pain medicine.  You develop a new onset of abdominal pain.  You feel faint or pass out.  You are unable to  urinate. MAKE SURE YOU:   Understand these instructions.  Will watch your condition.  Will get help right away if you are not doing well or get worse. Document Released: 10/09/2005 Document Revised: 06/11/2013 Document Reviewed: 03/12/2013 [facility] Patient Information 2015 [place], [place]. This information is not intended to replace advice given to you by your health care provider. Make sure you discuss any questions you have with your health care provider.

## 2015-04-09 NOTE — ED Notes (Signed)
Patient to ED with c/o left flank pain that started about an hour prior to arrival, reports some nausea as well.

## 2015-06-03 ENCOUNTER — Emergency Department
Admission: EM | Admit: 2015-06-03 | Discharge: 2015-06-03 | Disposition: A | Discharge status: Home / Self Care | Payer: Self-pay | Attending: Emergency Medicine | Admitting: Emergency Medicine

## 2015-06-03 DIAGNOSIS — Z792 Long term (current) use of antibiotics: Secondary | ICD-10-CM | POA: Insufficient documentation

## 2015-06-03 DIAGNOSIS — Z79899 Other long term (current) drug therapy: Secondary | ICD-10-CM | POA: Insufficient documentation

## 2015-06-03 DIAGNOSIS — Z7982 Long term (current) use of aspirin: Secondary | ICD-10-CM | POA: Insufficient documentation

## 2015-06-03 DIAGNOSIS — Z720 Tobacco use: Secondary | ICD-10-CM | POA: Insufficient documentation

## 2015-06-03 DIAGNOSIS — I10 Essential (primary) hypertension: Secondary | ICD-10-CM | POA: Insufficient documentation

## 2015-06-03 DIAGNOSIS — L731 Pseudofolliculitis barbae: Secondary | ICD-10-CM | POA: Insufficient documentation

## 2015-06-03 DIAGNOSIS — N764 Abscess of vulva: Secondary | ICD-10-CM | POA: Insufficient documentation

## 2015-06-03 MED ORDER — SULFAMETHOXAZOLE-TRIMETHOPRIM 800-160 MG PO TABS: 1.0000 | ORAL_TABLET | Freq: Two times a day (BID) | ORAL | Status: DC

## 2015-06-03 MED ORDER — NABUMETONE 750 MG PO TABS: 750.0000 mg | ORAL_TABLET | Freq: Two times a day (BID) | ORAL | Status: DC

## 2015-06-03 NOTE — ED Provider Notes (Signed)
[facility] Emergency Department Provider Note ____________________________________________  Time seen: 1545  I have reviewed the triage vital signs and the nursing notes.  HISTORY  Chief Complaint  Abscess  HPI Carla [name] is a 54 y.o. female was to the ED with 2 weeks complaint of tender swollen nodule to the right side of her bulbar. She denies any spontaneous drainage, and reports she is been standing in the shower to help healing. She's been dosing Tylenol and Motrin without significantly to her symptoms.His any fevers, chills, sweats, or nausea and vomiting. She reports previous similar occurrences some requiring incision and drainage in the provider's office.  Past Medical History  Diagnosis Date  . Hypertension   . Hyperlipidemia   . GERD (gastroesophageal reflux disease)   . Bell's palsy   . Morbid obesity with BMI of 40.0-44.9, adult     Patient Active Problem List   Diagnosis Date Noted  . Chest pain 08/06/2012   Past Surgical History  Procedure Laterality Date  . Cholecystectomy    . Vaginal hysterectomy      Current Outpatient Rx  Name  Route  Sig  Dispense  Refill  . aspirin EC 81 MG tablet   Oral   Take 81 mg by mouth daily.         [name] atorvastatin (LIPITOR) 40 MG tablet   Oral   Take 40 mg by mouth at bedtime.          . cephALEXin (KEFLEX) 500 MG capsule   Oral   Take 1 capsule (500 mg total) by mouth 4 (four) times daily.   40 capsule   0   . citalopram (CELEXA) 20 MG tablet   Oral   Take 20 mg by mouth daily.         . metoprolol tartrate (LOPRESSOR) 25 MG tablet   Oral   Take 25 mg by mouth 2 (two) times daily.         . nabumetone (RELAFEN) 750 MG tablet   Oral   Take 1 tablet (750 mg total) by mouth 2 (two) times daily.   30 tablet   0   . ondansetron (ZOFRAN ODT) 4 MG disintegrating tablet   Oral   Take 1 tablet (4 mg total) by mouth every 6 (six) hours as needed for nausea or vomiting.   20  tablet   0   . orphenadrine (NORFLEX) 100 MG tablet   Oral   Take 1 tablet (100 mg total) by mouth 2 (two) times daily as needed for muscle spasms. Patient not taking: Reported on 04/09/2015   20 tablet   0   . oxyCODONE-acetaminophen (ROXICET) 5-325 MG per tablet   Oral   Take 1 tablet by mouth every 6 (six) hours as needed for severe pain.   20 tablet   0   . sulfamethoxazole-trimethoprim (BACTRIM DS,SEPTRA DS) 800-160 MG per tablet   Oral   Take 1 tablet by mouth 2 (two) times daily.   20 tablet   0   . tamsulosin (FLOMAX) 0.4 MG CAPS capsule   Oral   Take 1 capsule (0.4 mg total) by mouth daily.   7 capsule   0   . traMADol (ULTRAM) 50 MG tablet   Oral   Take 1 tablet (50 mg total) by mouth every 6 (six) hours as needed. Patient not taking: Reported on 04/09/2015   12 tablet   0    Allergies Review of patient's allergies indicates  no known allergies.  Family History  Problem Relation Age of Onset  . Hypertension Mother   . Heart disease Mother   . Heart attack Mother   . Hypertension Father     Social History Social History  Substance Use Topics  . Smoking status: Current Every Day Smoker    Types: Cigarettes  . Smokeless tobacco: Not on file  . Alcohol Use: No   Review of Systems  Constitutional: Negative for fever. Eyes: Negative for visual changes. ENT: Negative for sore throat. Cardiovascular: Negative for chest pain. Respiratory: Negative for shortness of breath. Gastrointestinal: Negative for abdominal pain, vomiting and diarrhea. Genitourinary: Negative for dysuria. Musculoskeletal: Negative for back pain. Skin: Negative for rash. Tender nodule to vulva as above. Neurological: Negative for headaches, focal weakness or numbness. ____________________________________________  PHYSICAL EXAM:  VITAL SIGNS: ED Triage Vitals  Enc Vitals Group     BP 06/03/15 1414 159/120 mmHg     Pulse Rate 06/03/15 1414 97     Resp 06/03/15 1414 18      Temp 06/03/15 1414 98.7 F (37.1 C)     Temp Source 06/03/15 1414 Oral     SpO2 06/03/15 1414 98 %     Weight 06/03/15 1414 220 lb (99.791 kg)     Height 06/03/15 1414 5\' 3"  (1.6 m)     Head Cir --      Peak Flow --      Pain Score 06/03/15 1536 8     Pain Loc --      Pain Edu? --      Excl. in [place]? --    Constitutional: Alert and oriented. Well appearing and in no distress. Eyes: Conjunctivae are normal. PERRL. Normal extraocular movements. ENT   Head: Normocephalic and atraumatic.   Nose: No congestion/rhinnorhea.   Mouth/Throat: Mucous membranes are moist.   Neck: Supple. No thyromegaly. Hematological/Lymphatic/Immunilogical: No cervical lymphadenopathy. Cardiovascular: Normal rate, regular rhythm.  Respiratory: Normal respiratory effort. No wheezes/rales/rhonchi. Gastrointestinal: Soft and nontender. No distention. Musculoskeletal: Nontender with normal range of motion in all extremities.  Neurologic:  Normal gait without ataxia. Normal speech and language. No gross focal neurologic deficits are appreciated. Skin:  Skin is warm, dry and intact. No rash noted. Right vulvar with firm, tender, deep cystic formation suggestive of ingrown hair. No appreciable fluctuance, pointing, or central punctum. Psychiatric: Mood and affect are normal. Patient exhibits appropriate insight and judgment. ____________________________________________  INITIAL IMPRESSION / ASSESSMENT AND PLAN / ED COURSE  Small, deep vulvar abscess without evidence of pointing, fluctuance, or punctum to suggest need for incision & drainage. Suggest start Bactrim DS and apply warm compresses to promote healing. Return to the ED in 3-5 days for wound check as needed.  ____________________________________________  FINAL CLINICAL IMPRESSION(S) / ED DIAGNOSES  Final diagnoses:  Ingrown hair  Vulvar abscess     Melvenia Needles, PA-C 06/03/15 1641  Hinda Kehr, MD 06/03/15 2217

## 2015-06-03 NOTE — ED Notes (Signed)
Pt c/o abscess to the right groin for the past 2 weeks.Marland Kitchendenies drainage

## 2015-06-03 NOTE — Discharge Instructions (Signed)
Ingrown Hair An ingrown hair is a hair that curls and re-enters the skin instead of growing straight out of the skin. It happens most often with curly hair. It is usually more severe in the neck area, but it can occur in any shaved area, including the beard area, groin, scalp, and legs. An ingrown hair may cause small pockets of infection. CAUSES  Shaving closely, tweezing, or waxing, especially curly hair. Using hair removal creams can sometimes lead to ingrown hairs, especially in the groin. SYMPTOMS   Small bumps on the skin. The bumps may be filled with pus.  Pain.  Itching. DIAGNOSIS  Your caregiver can usually tell what is wrong by doing a physical exam. TREATMENT  If there is a severe infection, your caregiver may prescribe antibiotic medicines. Laser hair removal may also be done to help prevent regrowth of the hair. HOME CARE INSTRUCTIONS   Do not shave irritated skin. You may start shaving again once the irritation has gone away.  If you are prone to ingrown hairs, consider not shaving as much as possible.  If antibiotics are prescribed, take them as directed. Finish them even if you start to feel better.  You may use a facial sponge in a gentle circular motion to help dislodge ingrown hairs on the face.  You may use a hair removal cream weekly, especially on the legs and underarms. Stop using the cream if it irritates your skin. Use caution when using hair removal creams in the groin area. SHAVING INSTRUCTIONS AFTER TREATMENT  Shower before shaving. Keep areas to be shaved packed in warm, moist wraps for several minutes before shaving. The warm, moist environment helps soften the hairs and makes ingrown hairs less likely to occur.  Use thick shaving gels.  Use a bump fighter razor that cuts hair slightly above the skin level or use an electric shaver with a longer shave setting.  Shave in the direction of hair growth. Avoid making multiple razor strokes.  Use  moisturizing lotions after shaving. Document Released: 01/15/2001 Document Revised: 04/09/2012 Document Reviewed: 01/09/2012 [facility] Patient Information 2015 [place], [place]. This information is not intended to replace advice given to you by your health care provider. Make sure you discuss any questions you have with your health care provider.  Take the antibiotic as directed, until completely gone.  Apply warm, moist compresses to the area 3-4 time daily to promote healing.  Return to the ED or [facility] for wound check and possible drainage is a head forms.

## 2015-06-06 ENCOUNTER — Emergency Department
Admission: EM | Admit: 2015-06-06 | Discharge: 2015-06-06 | Disposition: A | Discharge status: Home / Self Care | Payer: Self-pay | Attending: Emergency Medicine | Admitting: Emergency Medicine

## 2015-06-06 ENCOUNTER — Encounter: Payer: Self-pay | Admitting: Emergency Medicine

## 2015-06-06 DIAGNOSIS — Z792 Long term (current) use of antibiotics: Secondary | ICD-10-CM | POA: Insufficient documentation

## 2015-06-06 DIAGNOSIS — Z79899 Other long term (current) drug therapy: Secondary | ICD-10-CM | POA: Insufficient documentation

## 2015-06-06 DIAGNOSIS — Z720 Tobacco use: Secondary | ICD-10-CM | POA: Insufficient documentation

## 2015-06-06 DIAGNOSIS — L02416 Cutaneous abscess of left lower limb: Secondary | ICD-10-CM | POA: Insufficient documentation

## 2015-06-06 DIAGNOSIS — I10 Essential (primary) hypertension: Secondary | ICD-10-CM | POA: Insufficient documentation

## 2015-06-06 DIAGNOSIS — Z7982 Long term (current) use of aspirin: Secondary | ICD-10-CM | POA: Insufficient documentation

## 2015-06-06 MED ORDER — HYDROCODONE-ACETAMINOPHEN 5-325 MG PO TABS: 1.0000 | ORAL_TABLET | ORAL | Status: DC | PRN

## 2015-06-06 MED ORDER — LIDOCAINE HCL (PF) 1 % IJ SOLN
5.0000 mL | Freq: Once | INTRAMUSCULAR | Status: AC
  Administered 2015-06-06: 5 mL
  Filled 2015-06-06: qty 5

## 2015-06-06 MED ORDER — HYDROCODONE-ACETAMINOPHEN 5-325 MG PO TABS
1.0000 | ORAL_TABLET | Freq: Once | ORAL | Status: AC
  Administered 2015-06-06: 1 via ORAL
  Filled 2015-06-06: qty 1

## 2015-06-06 NOTE — ED Provider Notes (Signed)
[facility] Emergency Department Provider Note ____________________________________________  Time seen: Approximately 3:19 PM  I have reviewed the triage vital signs and the nursing notes.   HISTORY  Chief Complaint Abscess   HPI Carla [name] is a 54 y.o. female is here with complaint of abscess to the upper left leg and right groin area. She was seen on 8/11 and started on a sulfa drug. She was told to return here in 2 days if drainage of these areas was needed. She is continued to take the antibiotic, these have continued to be very painful. She states the one on the right drained some this morning. Currently her pain is 9 out of 10.   Past Medical History  Diagnosis Date  . Hypertension   . Hyperlipidemia   . GERD (gastroesophageal reflux disease)   . Bell's palsy   . Morbid obesity with BMI of 40.0-44.9, adult     Patient Active Problem List   Diagnosis Date Noted  . Chest pain 08/06/2012    Past Surgical History  Procedure Laterality Date  . Cholecystectomy    . Vaginal hysterectomy      Current Outpatient Rx  Name  Route  Sig  Dispense  Refill  . aspirin EC 81 MG tablet   Oral   Take 81 mg by mouth daily.         [name] atorvastatin (LIPITOR) 40 MG tablet   Oral   Take 40 mg by mouth at bedtime.          . cephALEXin (KEFLEX) 500 MG capsule   Oral   Take 1 capsule (500 mg total) by mouth 4 (four) times daily.   40 capsule   0   . citalopram (CELEXA) 20 MG tablet   Oral   Take 20 mg by mouth daily.         [name] HYDROcodone-acetaminophen (NORCO/VICODIN) 5-325 MG per tablet   Oral   Take 1 tablet by mouth every 4 (four) hours as needed for moderate pain.   20 tablet   0   . metoprolol tartrate (LOPRESSOR) 25 MG tablet   Oral   Take 25 mg by mouth 2 (two) times daily.         . nabumetone (RELAFEN) 750 MG tablet   Oral   Take 1 tablet (750 mg total) by mouth 2 (two) times daily.   30 tablet   0   . ondansetron (ZOFRAN  ODT) 4 MG disintegrating tablet   Oral   Take 1 tablet (4 mg total) by mouth every 6 (six) hours as needed for nausea or vomiting.   20 tablet   0   . orphenadrine (NORFLEX) 100 MG tablet   Oral   Take 1 tablet (100 mg total) by mouth 2 (two) times daily as needed for muscle spasms. Patient not taking: Reported on 04/09/2015   20 tablet   0   . sulfamethoxazole-trimethoprim (BACTRIM DS,SEPTRA DS) 800-160 MG per tablet   Oral   Take 1 tablet by mouth 2 (two) times daily.   20 tablet   0   . tamsulosin (FLOMAX) 0.4 MG CAPS capsule   Oral   Take 1 capsule (0.4 mg total) by mouth daily.   7 capsule   0     Allergies Review of patient's allergies indicates no known allergies.  Family History  Problem Relation Age of Onset  . Hypertension Mother   . Heart disease Mother   . Heart attack Mother   .  Hypertension Father     Social History Social History  Substance Use Topics  . Smoking status: Current Every Day Smoker    Types: Cigarettes  . Smokeless tobacco: None  . Alcohol Use: No    Review of Systems Constitutional: No fever/chills Eyes: No visual changes. Cardiovascular: Denies chest pain. Respiratory: Denies shortness of breath. Gastrointestinal: No abdominal pain.  No nausea, no vomiting. Genitourinary: Negative for dysuria. Musculoskeletal: Negative for back pain. Skin: Negative for rash. Neurological: Negative for headaches  10-point ROS otherwise negative.  ____________________________________________   PHYSICAL EXAM:  VITAL SIGNS: ED Triage Vitals  Enc Vitals Group     BP 06/06/15 1419 159/99 mmHg     Pulse Rate 06/06/15 1419 85     Resp 06/06/15 1419 16     Temp 06/06/15 1419 98.7 F (37.1 C)     Temp Source 06/06/15 1419 Oral     SpO2 06/06/15 1419 97 %     Weight 06/06/15 1419 220 lb (99.791 kg)     Height 06/06/15 1419 5\' 2"  (1.575 m)     Head Cir --      Peak Flow --      Pain Score 06/06/15 1419 9     Pain Loc --      Pain Edu?  --      Excl. in [place]? --     Constitutional: Alert and oriented. Well appearing and in no acute distress. Eyes: Conjunctivae are normal. PERRL. EOMI. Head: Atraumatic. Nose: No congestion/rhinnorhea. Neck: No stridor.   Cardiovascular: Normal rate, regular rhythm. Grossly normal heart sounds.  Good peripheral circulation. Respiratory: Normal respiratory effort.  No retractions. Lungs CTAB. Gastrointestinal: Soft and nontender. No distention. Musculoskeletal: No lower extremity tenderness nor edema.  No joint effusions. Neurologic:  Normal speech and language. No gross focal neurologic deficits are appreciated. No gait instability. Skin:  Skin is warm, dry there is a 1.5 cm abscess on the medial left thigh. Area is markedly tender with a fluctuant area. Abscess on the right is approximately same size 1.5 cm, markedly tender and hard to touch Psychiatric: Mood and affect are normal. Speech and behavior are normal.  ____________________________________________   LABS (all labs ordered are listed, but only abnormal results are displayed)  Labs Reviewed - No data to display  PROCEDURES  Procedure(s) performed: 1.5 cm abscess on the medial left thigh was prepped with saline and draped with sterile technique. 1% lidocaine was injected 2 cc. Incision was made with a #11 blade. There was a minimal amount of purulent material removed from the area. Area was packed with iodoform gauze.  Patient tolerated the procedure. Dressing was applied to the area.   Critical Care performed: No  ____________________________________________   INITIAL IMPRESSION / ASSESSMENT AND PLAN / ED COURSE  Pertinent labs & imaging results that were available during my care of the patient were reviewed by me and considered in my medical decision making (see chart for details).  Patient was already taking antibiotic's. She is continue taking these. She is return in 2 days for removal of the packing. She may continue  to use warm compresses on the one on the right.   FINAL CLINICAL IMPRESSION(S) / ED DIAGNOSES  Final diagnoses:  Abscess of left thigh      Johnn Hai, PA-C 06/06/15 2121  Delman Kitten, MD 06/07/15 [id number]

## 2015-06-06 NOTE — ED Notes (Signed)
Pt reports abscess to left upper leg/groin area and abscess to right pubic area.

## 2015-06-06 NOTE — ED Notes (Signed)
NAD noted at time of D/C. Pt taken to lobby via wheelchair. Denies questions/concerns at this time.

## 2015-06-06 NOTE — Discharge Instructions (Signed)
Abscess An abscess (boil or furuncle) is an infected area on or under the skin. This area is filled with yellowish-white fluid (pus) and other material (debris). HOME CARE   Only take medicines as told by your doctor.  If you were given antibiotic medicine, take it as directed. Finish the medicine even if you start to feel better.  If gauze is used, follow your doctor's directions for changing the gauze.  To avoid spreading the infection:  Keep your abscess covered with a bandage.  Wash your hands well.  Do not share personal care items, towels, or whirlpools with others.  Avoid skin contact with others.  Keep your skin and clothes clean around the abscess.  Keep all doctor visits as told. GET HELP RIGHT AWAY IF:   You have more pain, puffiness (swelling), or redness in the wound site.  You have more fluid or blood coming from the wound site.  You have muscle aches, chills, or you feel sick.  You have a fever. MAKE SURE YOU:   Understand these instructions.  Will watch your condition.  Will get help right away if you are not doing well or get worse. Document Released: 03/27/2008 Document Revised: 04/09/2012 Document Reviewed: 12/22/2011 [facility] Patient Information 2015 [place], [place]. This information is not intended to replace advice given to you by your health care provider. Make sure you discuss any questions you have with your health care provider.   RETURN TO ER  IN 2 DAYS CONTINUE TAKING YOUR ANTIBIOTICS

## 2015-06-10 IMAGING — CR DG CHEST 2V
1 series · 2 of 2 positions shown · non-contrast
Comparison: None.

CLINICAL DATA: Right lower rib pain

EXAM:
CHEST  2 VIEW

[Series 1: w chest pa · 0.14mm/px · 2 of 2 slices shown]
[im 1/2]
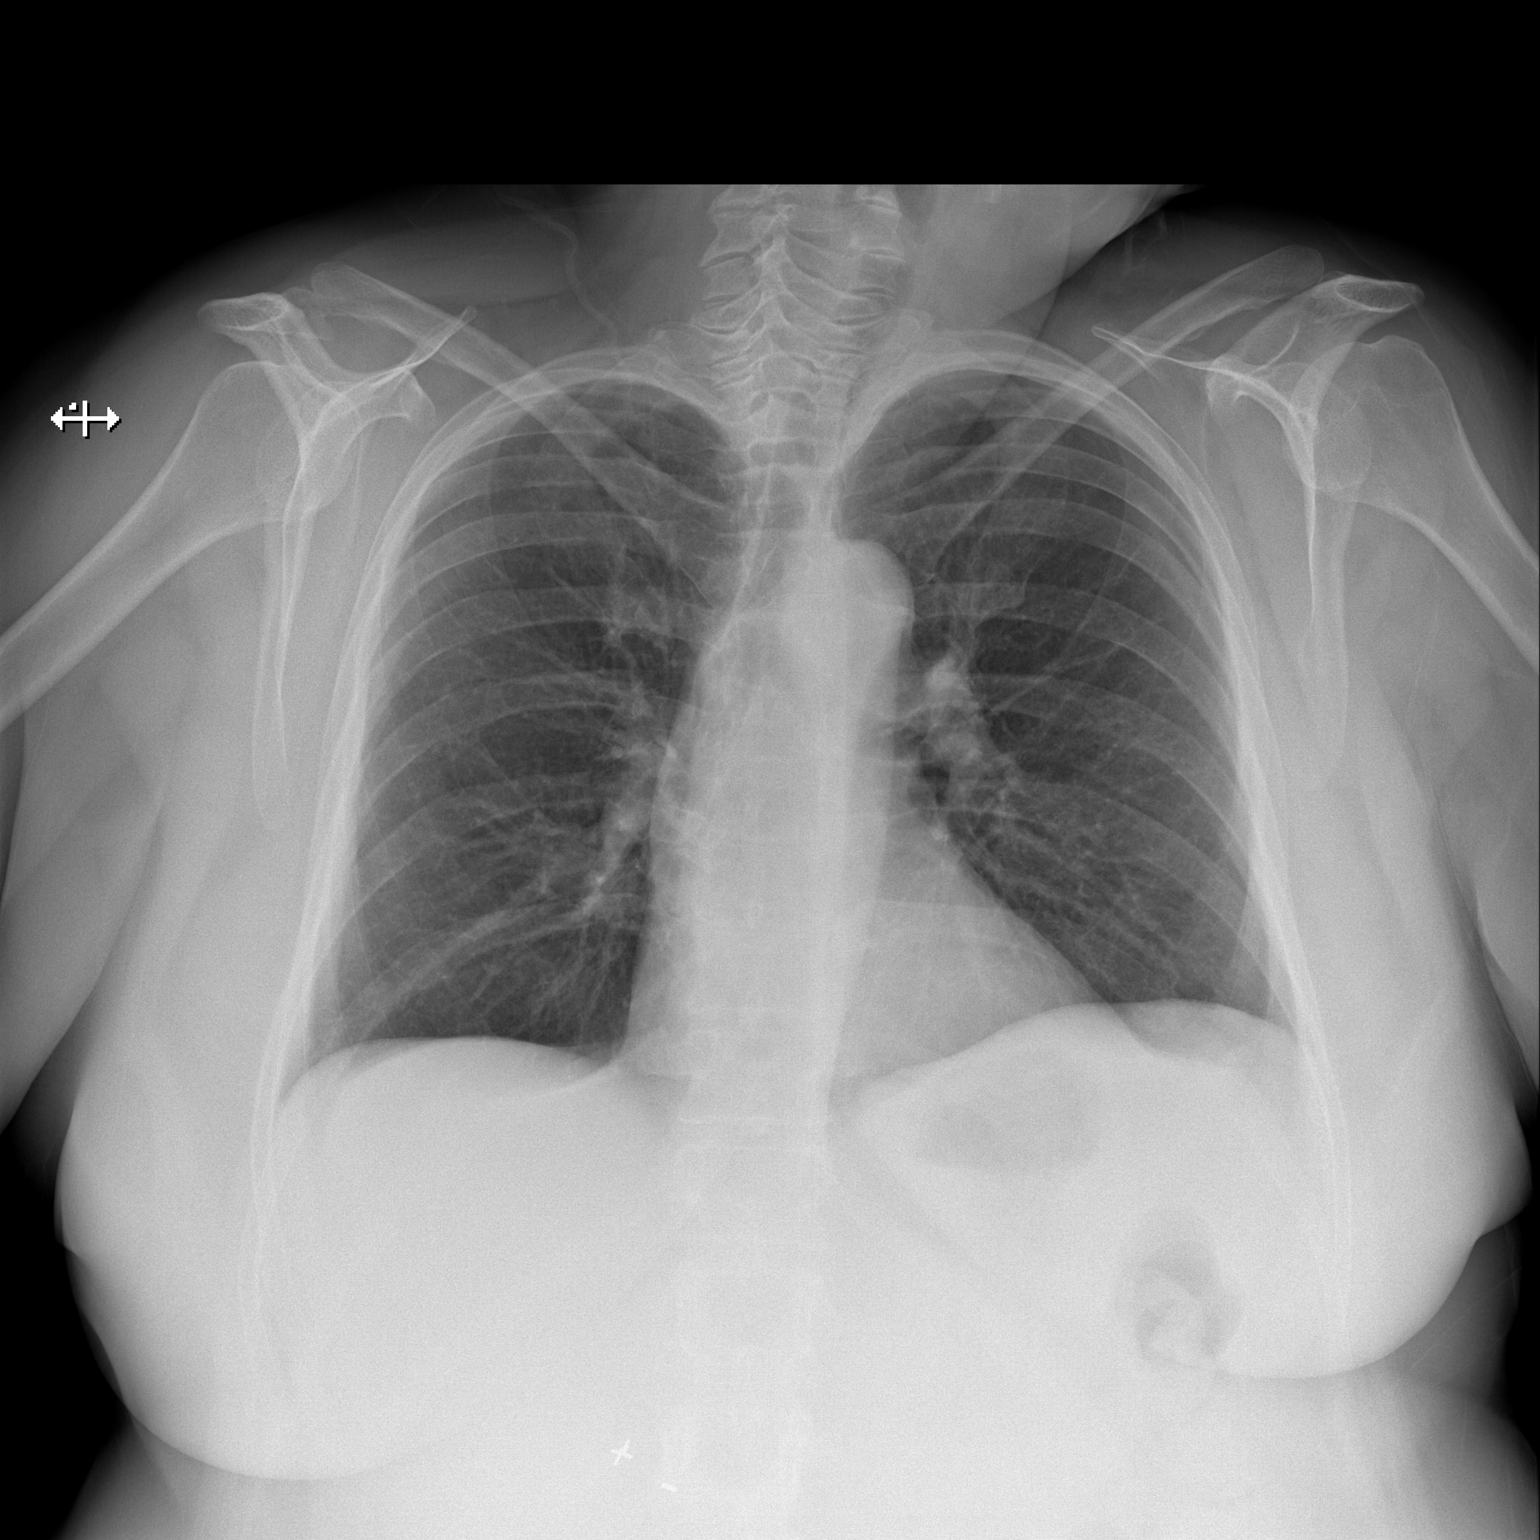
[im 2/2]
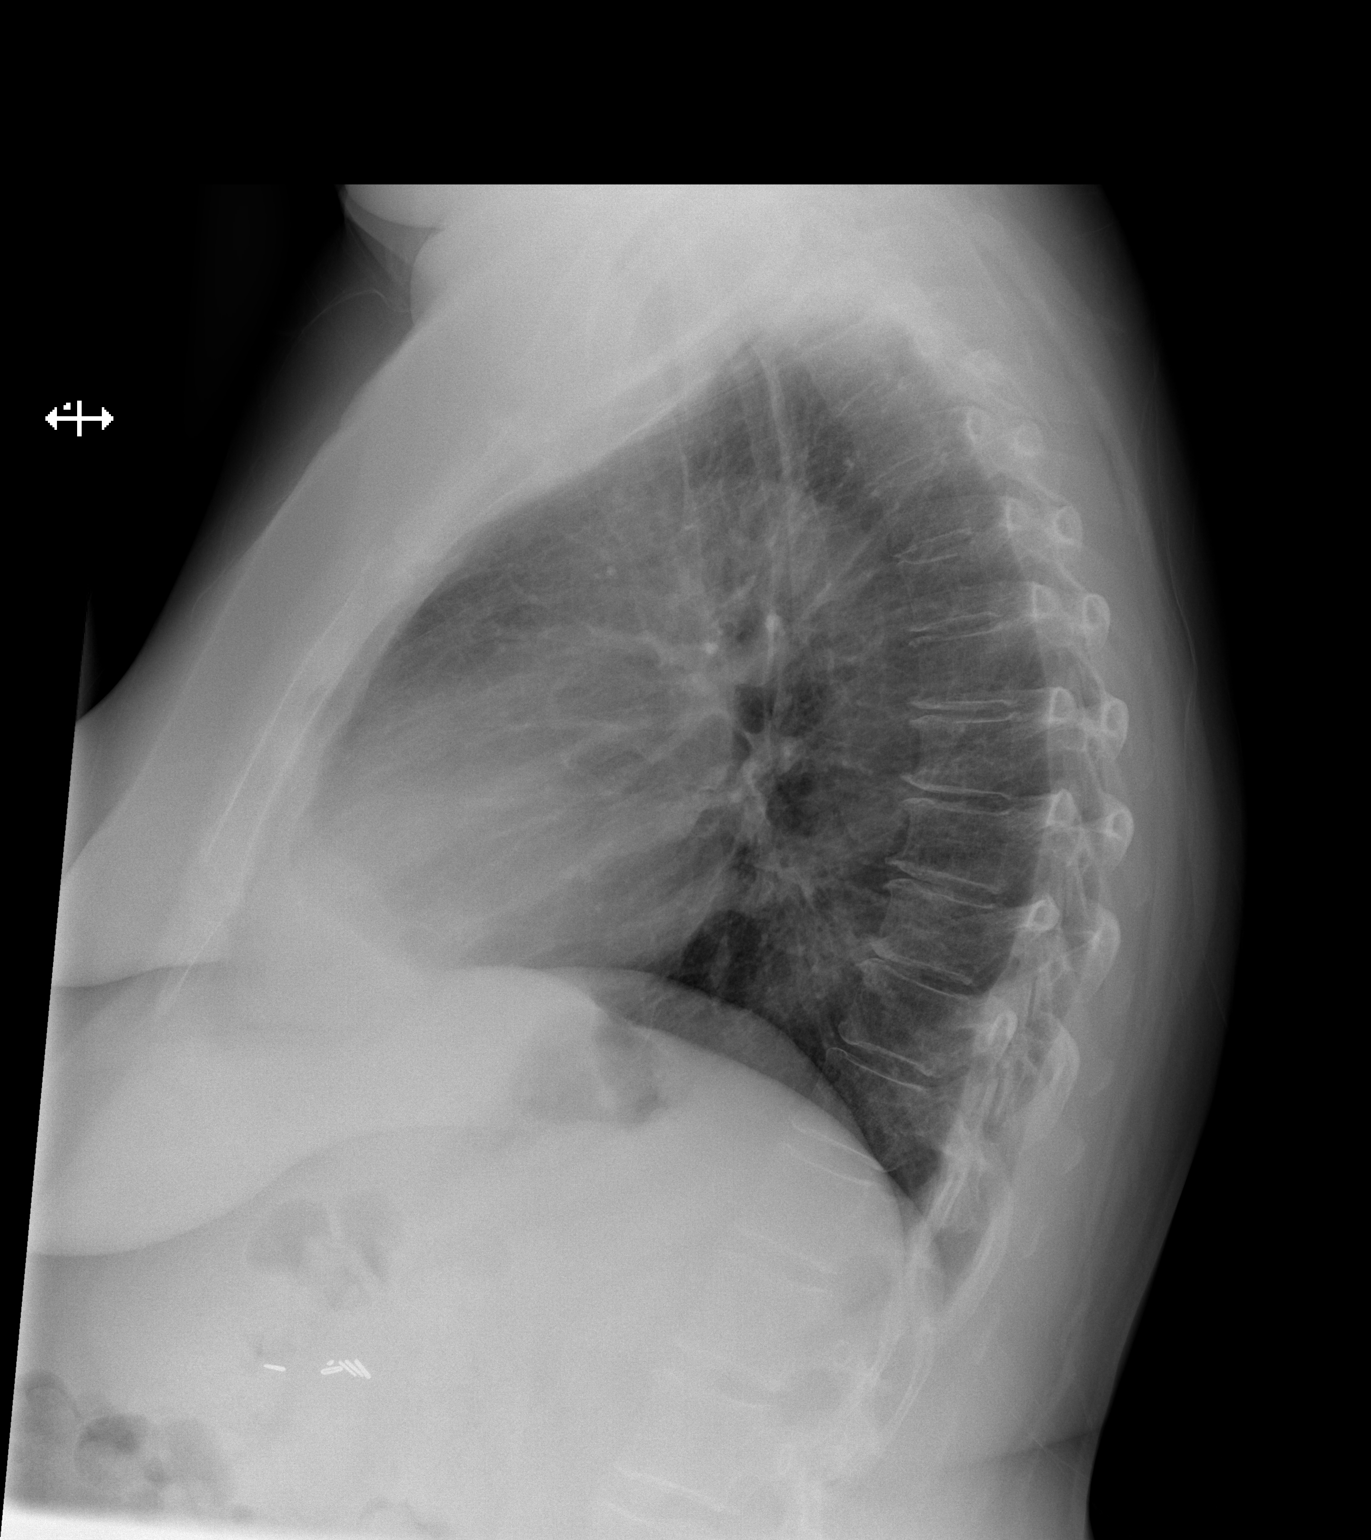

[2 of 2 positions shown; findings below may reference images not displayed]

FINDINGS: There is no focal parenchymal opacity, pleural effusion, or
pneumothorax. The heart and mediastinal contours are unremarkable.

Mild thoracic spine spondylosis.
IMPRESSION: No active cardiopulmonary disease.

## 2015-07-06 ENCOUNTER — Emergency Department: Payer: Self-pay

## 2015-07-06 ENCOUNTER — Encounter: Payer: Self-pay | Admitting: Emergency Medicine

## 2015-07-06 DIAGNOSIS — Z79899 Other long term (current) drug therapy: Secondary | ICD-10-CM | POA: Insufficient documentation

## 2015-07-06 DIAGNOSIS — Z7982 Long term (current) use of aspirin: Secondary | ICD-10-CM | POA: Insufficient documentation

## 2015-07-06 DIAGNOSIS — W1839XA Other fall on same level, initial encounter: Secondary | ICD-10-CM | POA: Insufficient documentation

## 2015-07-06 DIAGNOSIS — Z720 Tobacco use: Secondary | ICD-10-CM | POA: Insufficient documentation

## 2015-07-06 DIAGNOSIS — Z792 Long term (current) use of antibiotics: Secondary | ICD-10-CM | POA: Insufficient documentation

## 2015-07-06 DIAGNOSIS — Y9389 Activity, other specified: Secondary | ICD-10-CM | POA: Insufficient documentation

## 2015-07-06 DIAGNOSIS — I10 Essential (primary) hypertension: Secondary | ICD-10-CM | POA: Insufficient documentation

## 2015-07-06 DIAGNOSIS — Y9289 Other specified places as the place of occurrence of the external cause: Secondary | ICD-10-CM | POA: Insufficient documentation

## 2015-07-06 DIAGNOSIS — Y998 Other external cause status: Secondary | ICD-10-CM | POA: Insufficient documentation

## 2015-07-06 DIAGNOSIS — Z791 Long term (current) use of non-steroidal anti-inflammatories (NSAID): Secondary | ICD-10-CM | POA: Insufficient documentation

## 2015-07-06 DIAGNOSIS — S8002XA Contusion of left knee, initial encounter: Secondary | ICD-10-CM | POA: Insufficient documentation

## 2015-07-06 NOTE — ED Notes (Signed)
Pt arrived to the ED via wheel chair for right knee pain after sustaining a mechanical fall. Pt is AOx4 in no apparent distress. Mild swelling noticed, pulses and sensation are intact with decreased range of motion on affected knee.

## 2015-07-07 ENCOUNTER — Telehealth: Payer: Self-pay | Admitting: Emergency Medicine

## 2015-07-07 ENCOUNTER — Emergency Department
Admission: EM | Admit: 2015-07-07 | Discharge: 2015-07-07 | Disposition: A | Discharge status: Home / Self Care | Payer: Self-pay | Attending: Emergency Medicine | Admitting: Emergency Medicine

## 2015-07-07 DIAGNOSIS — M25561 Pain in right knee: Secondary | ICD-10-CM

## 2015-07-07 DIAGNOSIS — S8991XA Unspecified injury of right lower leg, initial encounter: Secondary | ICD-10-CM

## 2015-07-07 MED ORDER — OXYCODONE-ACETAMINOPHEN 5-325 MG PO TABS
1.0000 | ORAL_TABLET | Freq: Once | ORAL | Status: AC
  Administered 2015-07-07: 1 via ORAL
  Filled 2015-07-07: qty 1

## 2015-07-07 MED ORDER — OXYCODONE-ACETAMINOPHEN 5-325 MG PO TABS: 1.0000 | ORAL_TABLET | Freq: Once | ORAL | Status: DC

## 2015-07-07 NOTE — Discharge Instructions (Signed)

## 2015-07-07 NOTE — ED Notes (Signed)
[facility] called asking for instrucions for percocet rx.  Dr Corky Downs --1 tablet every 4-6 hours as needed.

## 2015-07-07 NOTE — ED Provider Notes (Signed)
[facility] Emergency Department Provider Note  ____________________________________________  Time seen: 1:40 AM  I have reviewed the triage vital signs and the nursing notes.   HISTORY  Chief Complaint Knee Injury      HPI CARAN STORCK is a 54 y.o. female presents with history of accidental fall onto her left knee today. Patient states current pain score is 8 out of 10 and worse with movement. Patient also admits to swelling and bruising noted to the left knee. She denies any head injury no loss of consciousness     Past Medical History  Diagnosis Date  . Hypertension   . Hyperlipidemia   . GERD (gastroesophageal reflux disease)   . Bell's palsy   . Morbid obesity with BMI of 40.0-44.9, adult     Patient Active Problem List   Diagnosis Date Noted  . Chest pain 08/06/2012    Past Surgical History  Procedure Laterality Date  . Cholecystectomy    . Vaginal hysterectomy      Current Outpatient Rx  Name  Route  Sig  Dispense  Refill  . aspirin EC 81 MG tablet   Oral   Take 81 mg by mouth daily.         [name] atorvastatin (LIPITOR) 40 MG tablet   Oral   Take 40 mg by mouth at bedtime.          . cephALEXin (KEFLEX) 500 MG capsule   Oral   Take 1 capsule (500 mg total) by mouth 4 (four) times daily.   40 capsule   0   . citalopram (CELEXA) 20 MG tablet   Oral   Take 20 mg by mouth daily.         [name] HYDROcodone-acetaminophen (NORCO/VICODIN) 5-325 MG per tablet   Oral   Take 1 tablet by mouth every 4 (four) hours as needed for moderate pain.   20 tablet   0   . metoprolol tartrate (LOPRESSOR) 25 MG tablet   Oral   Take 25 mg by mouth 2 (two) times daily.         . nabumetone (RELAFEN) 750 MG tablet   Oral   Take 1 tablet (750 mg total) by mouth 2 (two) times daily.   30 tablet   0   . ondansetron (ZOFRAN ODT) 4 MG disintegrating tablet   Oral   Take 1 tablet (4 mg total) by mouth every 6 (six) hours as needed for  nausea or vomiting.   20 tablet   0   . orphenadrine (NORFLEX) 100 MG tablet   Oral   Take 1 tablet (100 mg total) by mouth 2 (two) times daily as needed for muscle spasms. Patient not taking: Reported on 04/09/2015   20 tablet   0   . sulfamethoxazole-trimethoprim (BACTRIM DS,SEPTRA DS) 800-160 MG per tablet   Oral   Take 1 tablet by mouth 2 (two) times daily.   20 tablet   0   . tamsulosin (FLOMAX) 0.4 MG CAPS capsule   Oral   Take 1 capsule (0.4 mg total) by mouth daily.   7 capsule   0     Allergies Review of patient's allergies indicates no known allergies.  Family History  Problem Relation Age of Onset  . Hypertension Mother   . Heart disease Mother   . Heart attack Mother   . Hypertension Father     Social History Social History  Substance Use Topics  . Smoking status: Current Every  Day Smoker    Types: Cigarettes  . Smokeless tobacco: None  . Alcohol Use: No    Review of Systems  Constitutional: Negative for fever. Eyes: Negative for visual changes. ENT: Negative for sore throat. Cardiovascular: Negative for chest pain. Respiratory: Negative for shortness of breath. Gastrointestinal: Negative for abdominal pain, vomiting and diarrhea. Genitourinary: Negative for dysuria. Musculoskeletal: Negative for back pain. Positive for left knee pain Skin: Negative for rash. Neurological: Negative for headaches, focal weakness or numbness.  10-point ROS otherwise negative.  ____________________________________________   PHYSICAL EXAM:  VITAL SIGNS: ED Triage Vitals  Enc Vitals Group     BP 07/06/15 2237 157/102 mmHg     Pulse Rate 07/06/15 2237 93     Resp 07/06/15 2237 18     Temp 07/06/15 2237 98.3 F (36.8 C)     Temp Source 07/06/15 2237 Oral     SpO2 07/06/15 2234 97 %     Weight 07/06/15 2237 218 lb (98.884 kg)     Height 07/06/15 2237 5\' 3"  (1.6 m)     Head Cir --      Peak Flow --      Pain Score 07/06/15 2238 7     Pain Loc --       Pain Edu? --      Excl. in [place]? --      Constitutional: Alert and oriented. Well appearing and in no distress. Eyes: Conjunctivae are normal. PERRL. Normal extraocular movements. ENT   Head: Normocephalic and atraumatic.   Nose: No congestion/rhinnorhea.   Mouth/Throat: Mucous membranes are moist.   Neck: No stridor. Hematological/Lymphatic/Immunilogical: No cervical lymphadenopathy. Cardiovascular: Normal rate, regular rhythm. Normal and symmetric distal pulses are present in all extremities. No murmurs, rubs, or gallops. Respiratory: Normal respiratory effort without tachypnea nor retractions. Breath sounds are clear and equal bilaterally. No wheezes/rales/rhonchi. Gastrointestinal: Soft and nontender. No distention. There is no CVA tenderness. Genitourinary: deferred Musculoskeletal: Left knee ecchymoses swelling and pain to palpation. Pain with valgus and varus maneuvers as well as anterior draw. Neurologic:  Normal speech and language. No gross focal neurologic deficits are appreciated. Speech is normal.  Skin:  Skin is warm, dry and intact. No rash noted. Psychiatric: Mood and affect are normal. Speech and behavior are normal. Patient exhibits appropriate insight and judgment.   RADIOLOGY    DG Knee Complete 4 Views Right (Final result) Result time: 07/06/15 23:17:34   Final result by Rad Results In Interface (07/06/15 23:17:34)   Narrative:   CLINICAL DATA: Fall this afternoon with bruising, swelling and pain anterior right knee.  EXAM: RIGHT KNEE - COMPLETE 4+ VIEW  COMPARISON: 05/11/2014  FINDINGS: Examination demonstrates moderate tricompartmental osteoarthritic change. There is mild diffuse decreased bone mineralization. There is no acute fracture or dislocation. There is no significant joint effusion.  IMPRESSION: No acute findings.  Moderate osteoarthritic change.   Electronically Signed By: Marin Olp M.D. On: 07/06/2015 23:17           INITIAL IMPRESSION / ASSESSMENT AND PLAN / ED COURSE  Pertinent labs & imaging results that were available during my care of the patient were reviewed by me and considered in my medical decision making (see chart for details).  History of physical exam consistent with left knee contusion. Concern for possible ligamentous injury was conveyed to the patient and her family and a such follow-up with Dr. Marry Guan was stressed. Received Percocet one tablet as well as knee immobilizer and crutches.  ____________________________________________   FINAL  CLINICAL IMPRESSION(S) / ED DIAGNOSES  Final diagnoses:  Right knee injury, initial encounter  Right knee pain      Gregor Hams, MD 07/07/15 [phone]

## 2015-07-27 ENCOUNTER — Encounter: Payer: Self-pay | Admitting: Emergency Medicine

## 2015-07-27 ENCOUNTER — Emergency Department
Admission: EM | Admit: 2015-07-27 | Discharge: 2015-07-27 | Disposition: A | Discharge status: Home / Self Care | Payer: Self-pay | Attending: Student | Admitting: Student

## 2015-07-27 ENCOUNTER — Emergency Department: Payer: Self-pay

## 2015-07-27 DIAGNOSIS — I10 Essential (primary) hypertension: Secondary | ICD-10-CM | POA: Insufficient documentation

## 2015-07-27 DIAGNOSIS — Z7982 Long term (current) use of aspirin: Secondary | ICD-10-CM | POA: Insufficient documentation

## 2015-07-27 DIAGNOSIS — M25559 Pain in unspecified hip: Secondary | ICD-10-CM

## 2015-07-27 DIAGNOSIS — S79911A Unspecified injury of right hip, initial encounter: Secondary | ICD-10-CM | POA: Insufficient documentation

## 2015-07-27 DIAGNOSIS — S79912A Unspecified injury of left hip, initial encounter: Secondary | ICD-10-CM | POA: Insufficient documentation

## 2015-07-27 DIAGNOSIS — S3210XA Unspecified fracture of sacrum, initial encounter for closed fracture: Secondary | ICD-10-CM | POA: Insufficient documentation

## 2015-07-27 DIAGNOSIS — Z79899 Other long term (current) drug therapy: Secondary | ICD-10-CM | POA: Insufficient documentation

## 2015-07-27 DIAGNOSIS — W01198A Fall on same level from slipping, tripping and stumbling with subsequent striking against other object, initial encounter: Secondary | ICD-10-CM | POA: Insufficient documentation

## 2015-07-27 DIAGNOSIS — M25551 Pain in right hip: Secondary | ICD-10-CM

## 2015-07-27 DIAGNOSIS — M25552 Pain in left hip: Secondary | ICD-10-CM

## 2015-07-27 DIAGNOSIS — S39012A Strain of muscle, fascia and tendon of lower back, initial encounter: Secondary | ICD-10-CM | POA: Insufficient documentation

## 2015-07-27 DIAGNOSIS — Y9389 Activity, other specified: Secondary | ICD-10-CM | POA: Insufficient documentation

## 2015-07-27 DIAGNOSIS — M170 Bilateral primary osteoarthritis of knee: Secondary | ICD-10-CM | POA: Insufficient documentation

## 2015-07-27 DIAGNOSIS — Z720 Tobacco use: Secondary | ICD-10-CM | POA: Insufficient documentation

## 2015-07-27 DIAGNOSIS — Y92002 Bathroom of unspecified non-institutional (private) residence single-family (private) house as the place of occurrence of the external cause: Secondary | ICD-10-CM | POA: Insufficient documentation

## 2015-07-27 DIAGNOSIS — Y998 Other external cause status: Secondary | ICD-10-CM | POA: Insufficient documentation

## 2015-07-27 MED ORDER — OXYCODONE-ACETAMINOPHEN 5-325 MG PO TABS: 1.0000 | ORAL_TABLET | Freq: Four times a day (QID) | ORAL | Status: DC | PRN

## 2015-07-27 MED ORDER — OXYCODONE-ACETAMINOPHEN 5-325 MG PO TABS
ORAL_TABLET | ORAL | Status: AC
  Administered 2015-07-27: 1
  Filled 2015-07-27: qty 1

## 2015-07-27 NOTE — ED Notes (Signed)
Pt states she slipped and fell on bathroom floor and landed on both knees, states she then slipped again and fell on lower back, c/o pain to bilateral knees and lower back

## 2015-07-27 NOTE — ED Provider Notes (Signed)
CSN: [id number]     Arrival date & time 07/27/15  1408 History   First MD Initiated Contact with Patient 07/27/15 1423     Chief Complaint  Patient presents with  . Fall      HPI Comments: 54 year old female presents today complaining of bilateral knee pain, bilateral hip pain and low back pain secondary to 2 falls that occurred this morning. Patient reports that she fell forward onto both her knees in her bathroom. She slipped on some water in the floor. Then after she got up she fell again hitting her back on her bathtub. She was able to get up by herself. Has been ambulatory since the fall.  Patient is a 54 y.o. female presenting with fall.  Fall Associated symptoms include arthralgias, joint swelling and myalgias. Pertinent negatives include no headaches or neck pain.    Past Medical History  Diagnosis Date  . Hypertension   . Hyperlipidemia   . GERD (gastroesophageal reflux disease)   . Bell's palsy   . Morbid obesity with BMI of 40.0-44.9, adult [facility])    Past Surgical History  Procedure Laterality Date  . Cholecystectomy    . Vaginal hysterectomy     Family History  Problem Relation Age of Onset  . Hypertension Mother   . Heart disease Mother   . Heart attack Mother   . Hypertension Father    Social History  Substance Use Topics  . Smoking status: Current Every Day Smoker    Types: Cigarettes  . Smokeless tobacco: None  . Alcohol Use: No   OB History    No data available     Review of Systems  Musculoskeletal: Positive for myalgias, back pain, joint swelling and arthralgias. Negative for gait problem, neck pain and neck stiffness.  Neurological: Negative for syncope and headaches.  All other systems reviewed and are negative.     Allergies  Review of patient's allergies indicates no known allergies.  Home Medications   Prior to Admission medications   Medication Sig Start Date End Date Taking? Authorizing Provider  aspirin EC 81 MG tablet Take 81 mg  by mouth daily.    Historical Provider, MD  atorvastatin (LIPITOR) 40 MG tablet Take 40 mg by mouth at bedtime.     Historical Provider, MD  citalopram (CELEXA) 20 MG tablet Take 20 mg by mouth daily.    Historical Provider, MD  metoprolol tartrate (LOPRESSOR) 25 MG tablet Take 25 mg by mouth 2 (two) times daily.    Historical Provider, MD  nabumetone (RELAFEN) 750 MG tablet Take 1 tablet (750 mg total) by mouth 2 (two) times daily. 06/03/15   Jenise V Bacon Menshew, PA-C  ondansetron (ZOFRAN ODT) 4 MG disintegrating tablet Take 1 tablet (4 mg total) by mouth every 6 (six) hours as needed for nausea or vomiting. 04/09/15   Delman Kitten, MD  oxyCODONE-acetaminophen (ROXICET) 5-325 MG tablet Take 1 tablet by mouth every 6 (six) hours as needed. 07/27/15 07/26/16  Shayne Alken V, PA-C   BP 145/110 mmHg  Pulse 95  Temp(Src) 97.8 F (36.6 C) (Oral)  Resp 18  Ht 5\' 3"  (1.6 m)  Wt 218 lb (98.884 kg)  BMI 38.63 kg/m2  SpO2 98% Physical Exam  Constitutional: She is oriented to person, place, and time. Vital signs are normal. She appears well-developed and well-nourished. She is active.  Non-toxic appearance. She does not have a sickly appearance. She does not appear ill.  Morbidly obese  HENT:  Head: Normocephalic and  atraumatic.  Neck: Normal range of motion. Neck supple.  Musculoskeletal:       Right hip: She exhibits tenderness. She exhibits normal range of motion.       Left hip: She exhibits tenderness. She exhibits normal range of motion.       Right knee: She exhibits normal range of motion. Tenderness found.       Left knee: She exhibits normal range of motion. Tenderness found.       Lumbar back: She exhibits tenderness and bony tenderness.  Diffusely tender to palpation of her lumbar spine. Right and Left lumbar paraspinal muscles tender to palpation Hips with tenderness to palpation over greater trochanters bilaterally. Full range of motion intact.  Neurological: She is alert and oriented  to person, place, and time.  Skin: Skin is warm and dry.  No ecchymosis noted to back.  Psychiatric: She has a normal mood and affect. Her behavior is normal. Judgment and thought content normal.  Nursing note and vitals reviewed.   ED Course  Procedures (including critical care time) Labs Review Labs Reviewed - No data to display  Imaging Review Dg Lumbar Spine 2-3 Views  07/27/2015   CLINICAL DATA:  The patient slipped and fell down on the bathroom floor with low back pain.  EXAM: LUMBAR SPINE - 2-3 VIEW  COMPARISON:  CT abdomen pelvis April 09, 2015  FINDINGS: There is question cortical discontinuity in the distal sacrum not seen on prior sagittal CT, question fracture. No other fracture or dislocation is identified. There is scoliosis of spine.  IMPRESSION: There is question cortical discontinuity in the distal sacrum not seen on prior sagittal CT, question fracture.   Electronically Signed   By: Abelardo Diesel M.D.   On: 07/27/2015 15:18   Dg Knee Complete 4 Views Left  07/27/2015   CLINICAL DATA:  Acute left knee pain after fall in bathroom. Initial encounter.  EXAM: LEFT KNEE - COMPLETE 4+ VIEW  COMPARISON:  None.  FINDINGS: There is no evidence of fracture, dislocation, or joint effusion. There is no evidence of arthropathy or other focal bone abnormality. Soft tissues are unremarkable.  IMPRESSION: Normal left knee.   Electronically Signed   By: Marijo Conception, M.D.   On: 07/27/2015 15:15   Dg Knee Complete 4 Views Right  07/27/2015   CLINICAL DATA:  Status post fall with right knee pain today.  EXAM: RIGHT KNEE - COMPLETE 4+ VIEW  COMPARISON:  None.  FINDINGS: There is no evidence of fracture, dislocation, or joint effusion. There are degenerative joint changes with narrowed joint space and osteophyte formation. Soft tissues are unremarkable.  IMPRESSION: No acute fracture or dislocation. Osteoarthritic changes of the right knee.   Electronically Signed   By: Abelardo Diesel M.D.   On:  07/27/2015 15:19   Dg Hips Bilat With Pelvis 2v  07/27/2015   CLINICAL DATA:  Pain following fall  EXAM: DG HIP (WITH OR WITHOUT PELVIS) 4+V BILAT  COMPARISON:  None.  FINDINGS: Frontal pelvis as well as frontal and lateral hip images bilaterally obtained. There is no fracture or dislocation. Joint spaces appear intact. No erosive change. There is a surgical clip in the right pelvis.  IMPRESSION: No fracture or dislocation.  No appreciable arthropathy.   Electronically Signed   By: Lowella Grip III M.D.   On: 07/27/2015 15:13   I have personally reviewed and evaluated these images and lab results as part of my medical decision-making.   EKG Interpretation  None      MDM  I reviewed lumbar spine xray and there is an irregularity of distal sacral bone. Pt's injury is not consistent with the trauma that would cause a sacral fracture. Advised her to follow up with pcp for further care. Short course of percocet 5-325 #20.   Final diagnoses:  Hip pain, acute, unspecified laterality  Bilateral hip pain  Primary osteoarthritis of both knees  Lumbar strain, initial encounter  Sacral fracture, closed, initial encounter        Corliss Parish, PA-C 07/27/15 1604  Orbie Pyo, MD 07/28/15 1630

## 2015-08-24 ENCOUNTER — Emergency Department
Admission: EM | Admit: 2015-08-24 | Discharge: 2015-08-24 | Disposition: A | Discharge status: Home / Self Care | Payer: Self-pay | Attending: Emergency Medicine | Admitting: Emergency Medicine

## 2015-08-24 ENCOUNTER — Encounter: Payer: Self-pay | Admitting: Emergency Medicine

## 2015-08-24 DIAGNOSIS — E785 Hyperlipidemia, unspecified: Secondary | ICD-10-CM | POA: Insufficient documentation

## 2015-08-24 DIAGNOSIS — R110 Nausea: Secondary | ICD-10-CM | POA: Insufficient documentation

## 2015-08-24 DIAGNOSIS — Z79899 Other long term (current) drug therapy: Secondary | ICD-10-CM | POA: Insufficient documentation

## 2015-08-24 DIAGNOSIS — D2321 Other benign neoplasm of skin of right ear and external auricular canal: Secondary | ICD-10-CM

## 2015-08-24 DIAGNOSIS — I10 Essential (primary) hypertension: Secondary | ICD-10-CM | POA: Insufficient documentation

## 2015-08-24 DIAGNOSIS — M1711 Unilateral primary osteoarthritis, right knee: Secondary | ICD-10-CM | POA: Insufficient documentation

## 2015-08-24 DIAGNOSIS — L720 Epidermal cyst: Secondary | ICD-10-CM | POA: Insufficient documentation

## 2015-08-24 DIAGNOSIS — Z720 Tobacco use: Secondary | ICD-10-CM | POA: Insufficient documentation

## 2015-08-24 DIAGNOSIS — Z7982 Long term (current) use of aspirin: Secondary | ICD-10-CM | POA: Insufficient documentation

## 2015-08-24 MED ORDER — TRAMADOL HCL 50 MG PO TABS
50.0000 mg | ORAL_TABLET | Freq: Once | ORAL | Status: AC
  Administered 2015-08-24: 50 mg via ORAL
  Filled 2015-08-24: qty 1

## 2015-08-24 MED ORDER — NAPROXEN 500 MG PO TABS
500.0000 mg | ORAL_TABLET | Freq: Once | ORAL | Status: AC
  Administered 2015-08-24: 500 mg via ORAL
  Filled 2015-08-24: qty 1

## 2015-08-24 MED ORDER — TRAMADOL HCL 50 MG PO TABS: 50.0000 mg | ORAL_TABLET | Freq: Four times a day (QID) | ORAL | Status: DC | PRN

## 2015-08-24 MED ORDER — MELOXICAM 15 MG PO TABS: 15.0000 mg | ORAL_TABLET | Freq: Every day | ORAL | Status: DC

## 2015-08-24 NOTE — ED Notes (Signed)
Pt reports right knee pain x5 weeks to top of knee, pt also reports swollen area behind right ear x1 month, no redness or warmth present.

## 2015-08-24 NOTE — ED Notes (Signed)
Right knee pain

## 2015-08-24 NOTE — ED Provider Notes (Signed)
[facility] Emergency Department Provider Note  ____________________________________________  Time seen: Approximately 11:32 AM  I have reviewed the triage vital signs and the nursing notes.   HISTORY  Chief Complaint Lymphadenopathy and Knee Pain    HPI Carla [name] is a 54 y.o. female patient complaining of right knee pain for 5 weeks but is worse in the last 5 days. Patient was seen on 07/27/2015 for bilateral knee pain secondary to a fall. During that exam x-rays were taken of the knee shows moderate degenerative changes in the right knee. The right knee was unremarkable. Patient was advised to follow-up with orthopedics or her family doctor. Patient did not follow-up as directed. Patient also complaining of a nausea lesion behind her right ear this been there for 1 month. Patient states the lesions are nonpainful. Patient denies any fever hearing loss or vertigo with this complaint. No palliative measures taken for both complaints. Patient is rating her knee pain as a 9/10. She describes the pain as aching.   Past Medical History  Diagnosis Date  . Hypertension   . Hyperlipidemia   . GERD (gastroesophageal reflux disease)   . Bell's palsy   . Morbid obesity with BMI of 40.0-44.9, adult [facility])     Patient Active Problem List   Diagnosis Date Noted  . Chest pain 08/06/2012    Past Surgical History  Procedure Laterality Date  . Cholecystectomy    . Vaginal hysterectomy      Current Outpatient Rx  Name  Route  Sig  Dispense  Refill  . aspirin EC 81 MG tablet   Oral   Take 81 mg by mouth daily.         [name] atorvastatin (LIPITOR) 40 MG tablet   Oral   Take 40 mg by mouth at bedtime.          . citalopram (CELEXA) 20 MG tablet   Oral   Take 20 mg by mouth daily.         . metoprolol tartrate (LOPRESSOR) 25 MG tablet   Oral   Take 25 mg by mouth 2 (two) times daily.         . nabumetone (RELAFEN) 750 MG tablet   Oral   Take 1  tablet (750 mg total) by mouth 2 (two) times daily.   30 tablet   0   . ondansetron (ZOFRAN ODT) 4 MG disintegrating tablet   Oral   Take 1 tablet (4 mg total) by mouth every 6 (six) hours as needed for nausea or vomiting.   20 tablet   0   . oxyCODONE-acetaminophen (ROXICET) 5-325 MG tablet   Oral   Take 1 tablet by mouth every 6 (six) hours as needed.   20 tablet   0     Allergies Review of patient's allergies indicates no known allergies.  Family History  Problem Relation Age of Onset  . Hypertension Mother   . Heart disease Mother   . Heart attack Mother   . Hypertension Father     Social History Social History  Substance Use Topics  . Smoking status: Current Every Day Smoker    Types: Cigarettes  . Smokeless tobacco: None  . Alcohol Use: No    Review of Systems Constitutional: No fever/chills Eyes: No visual changes. ENT: No sore throat. Cardiovascular: Denies chest pain. Respiratory: Denies shortness of breath. Gastrointestinal: No abdominal pain.  No nausea, no vomiting.  No diarrhea.  No constipation. Genitourinary: Negative for dysuria.  Musculoskeletal: Negative for back pain. Skin: Negative for rash. Nodular lesion behind right ear. Neurological: Negative for headaches, focal weakness or numbness. Endocrine:hyperlipidemia hyperlipidemia, hypertension, and morbid obesity. 10-point ROS otherwise negative.  ____________________________________________   PHYSICAL EXAM:  VITAL SIGNS: ED Triage Vitals  Enc Vitals Group     BP 08/24/15 1115 184/103 mmHg     Pulse Rate 08/24/15 1115 65     Resp 08/24/15 1115 16     Temp 08/24/15 1115 97.7 F (36.5 C)     Temp Source 08/24/15 1115 Oral     SpO2 08/24/15 1115 96 %     Weight 08/24/15 1115 218 lb (98.884 kg)     Height 08/24/15 1115 5\' 3"  (1.6 m)     Head Cir --      Peak Flow --      Pain Score 08/24/15 1116 9     Pain Loc --      Pain Edu? --      Excl. in [place]? --     Constitutional: Alert  and oriented. Well appearing and in no acute distress. Eyes: Conjunctivae are normal. PERRL. EOMI. Head: Atraumatic. Nose: No congestion/rhinnorhea. Mouth/Throat: Mucous membranes are moist.  Oropharynx non-erythematous. Neck: No stridor.  No cervical spine tenderness to palpation. Hematological/Lymphatic/Immunilogical: No cervical lymphadenopathy. Cardiovascular: Normal rate, regular rhythm. Grossly normal heart sounds.  Good peripheral circulation. Respiratory: Normal respiratory effort.  No retractions. Lungs CTAB. Gastrointestinal: Soft and nontender. No distention. No abdominal bruits. No CVA tenderness. Musculoskeletal: No lower extremity tenderness nor edema.  No joint effusions. Neurologic:  Normal speech and language. No gross focal neurologic deficits are appreciated. No gait instability. Skin:  Skin is warm, dry and intact. No rash noted. 1 cm mobile nodule lesion behind right ear. Psychiatric: Mood and affect are normal. Speech and behavior are normal.  ____________________________________________   LABS (all labs ordered are listed, but only abnormal results are displayed)  Labs Reviewed - No data to display ____________________________________________  EKG   ____________________________________________  RADIOLOGY  Reviewed x-ray from previous exam. ____________________________________________   PROCEDURES  Procedure(s) performed: None  Critical Care performed: No  ____________________________________________   INITIAL IMPRESSION / ASSESSMENT AND PLAN / ED COURSE  Pertinent labs & imaging results that were available during my care of the patient were reviewed by me and considered in my medical decision making (see chart for details).  Knee pain secondary to also arthritis. Cystic lesion behind right ear. Advised patient to follow-up with orthopedics for definitive evaluation and treatment of her right knee pain. Patient advised to follow-up with ear nose  and throat clinic for evaluation of the cystic mass behind her right ear. Patient given a prescription for Mobic and tramadol. ____________________________________________   FINAL CLINICAL IMPRESSION(S) / ED DIAGNOSES  Final diagnoses:  Primary osteoarthritis of right knee  Dermoid cyst of ear, right      Sable Feil, PA-C 08/24/15 [place], MD 08/24/15 2200

## 2015-08-24 NOTE — Discharge Instructions (Signed)
Advised to follow-up with the ear nose and throat clinic for cystic lesion behind the right ear.

## 2015-08-27 ENCOUNTER — Emergency Department
Admission: EM | Admit: 2015-08-27 | Discharge: 2015-08-27 | Disposition: A | Discharge status: Home / Self Care | Payer: Self-pay | Attending: Student | Admitting: Student

## 2015-08-27 ENCOUNTER — Encounter: Payer: Self-pay | Admitting: *Deleted

## 2015-08-27 DIAGNOSIS — G8929 Other chronic pain: Secondary | ICD-10-CM | POA: Insufficient documentation

## 2015-08-27 DIAGNOSIS — I10 Essential (primary) hypertension: Secondary | ICD-10-CM | POA: Insufficient documentation

## 2015-08-27 DIAGNOSIS — M25561 Pain in right knee: Secondary | ICD-10-CM

## 2015-08-27 DIAGNOSIS — Z7982 Long term (current) use of aspirin: Secondary | ICD-10-CM | POA: Insufficient documentation

## 2015-08-27 DIAGNOSIS — Z79899 Other long term (current) drug therapy: Secondary | ICD-10-CM | POA: Insufficient documentation

## 2015-08-27 DIAGNOSIS — Z720 Tobacco use: Secondary | ICD-10-CM | POA: Insufficient documentation

## 2015-08-27 DIAGNOSIS — Z791 Long term (current) use of non-steroidal anti-inflammatories (NSAID): Secondary | ICD-10-CM | POA: Insufficient documentation

## 2015-08-27 DIAGNOSIS — M1711 Unilateral primary osteoarthritis, right knee: Secondary | ICD-10-CM | POA: Insufficient documentation

## 2015-08-27 MED ORDER — CYCLOBENZAPRINE HCL 5 MG PO TABS: 5.0000 mg | ORAL_TABLET | Freq: Three times a day (TID) | ORAL | Status: DC | PRN

## 2015-08-27 MED ORDER — HYDROCODONE-ACETAMINOPHEN 5-325 MG PO TABS: 1.0000 | ORAL_TABLET | Freq: Three times a day (TID) | ORAL | Status: DC | PRN

## 2015-08-27 NOTE — Discharge Instructions (Signed)
Chronic Pain  Chronic pain can be defined as pain that is off and on and lasts for 3-6 months or longer. Many things cause chronic pain, which can make it difficult to make a diagnosis. There are many treatment options available for chronic pain. However, finding a treatment that works well for you may require trying various approaches until the right one is found. Many people benefit from a combination of two or more types of treatment to control their pain.  SYMPTOMS   Chronic pain can occur anywhere in the body and can range from mild to very severe. Some types of chronic pain include:  · Headache.  · Low back pain.  · Cancer pain.  · Arthritis pain.  · Neurogenic pain. This is pain resulting from damage to nerves.   People with chronic pain may also have other symptoms such as:  · Depression.  · Anger.  · Insomnia.  · Anxiety.  DIAGNOSIS   Your health care provider will help diagnose your condition over time. In many cases, the initial focus will be on excluding possible conditions that could be causing the pain. Depending on your symptoms, your health care provider may order tests to diagnose your condition. Some of these tests may include:   · Blood tests.    · CT scan.    · MRI.    · X-rays.    · Ultrasounds.    · Nerve conduction studies.    You may need to see a specialist.   TREATMENT   Finding treatment that works well may take time. You may be referred to a pain specialist. He or she may prescribe medicine or therapies, such as:   · Mindful meditation or yoga.  · Shots (injections) of numbing or pain-relieving medicines into the spine or area of pain.  · Local electrical stimulation.  · Acupuncture.    · Massage therapy.    · Aroma, color, light, or sound therapy.    · Biofeedback.    · Working with a physical therapist to keep from getting stiff.    · Regular, gentle exercise.    · Cognitive or behavioral therapy.    · Group support.    Sometimes, surgery may be recommended.   HOME CARE INSTRUCTIONS    · Take all medicines as directed by your health care provider.    · Lessen stress in your life by relaxing and doing things such as listening to calming music.    · Exercise or be active as directed by your health care provider.    · Eat a healthy diet and include things such as vegetables, fruits, fish, and lean meats in your diet.    · Keep all follow-up appointments with your health care provider.    · Attend a support group with others suffering from chronic pain.  SEEK MEDICAL CARE IF:   · Your pain gets worse.    · You develop a new pain that was not there before.    · You cannot tolerate medicines given to you by your health care provider.    · You have new symptoms since your last visit with your health care provider.    SEEK IMMEDIATE MEDICAL CARE IF:   · You feel weak.    · You have decreased sensation or numbness.    · You lose control of bowel or bladder function.    · Your pain suddenly gets much worse.    · You develop shaking.  · You develop chills.  · You develop confusion.  · You develop chest pain.  · You develop shortness of breath.    MAKE SURE YOU:  ·   Document Revised: 06/11/2013 Document Reviewed: 04/04/2013 Elsevier Interactive Patient Education 2016 Elsevier Inc[employer]  Osteoarthritis Osteoarthritis is a disease that causes soreness and inflammation of a joint. It occurs when the cartilage at the affected joint wears down. Cartilage acts as a cushion, covering the ends of bones where they meet to form a joint. Osteoarthritis is the most common form of arthritis. It often occurs in older people. The joints affected most often by this condition include those in  the:  Ends of the fingers.  Thumbs.  Neck.  Lower back.  Knees.  Hips. CAUSES  Over time, the cartilage that covers the ends of bones begins to wear away. This causes bone to rub on bone, producing pain and stiffness in the affected joints.  RISK FACTORS Certain factors can increase your chances of having osteoarthritis, including:  Older age.  Excessive body weight.  Overuse of joints.  Previous joint injury. SIGNS AND SYMPTOMS   Pain, swelling, and stiffness in the joint.  Over time, the joint may lose its normal shape.  Small deposits of bone (osteophytes) may grow on the edges of the joint.  Bits of bone or cartilage can break off and float inside the joint space. This may cause more pain and damage. DIAGNOSIS  Your health care provider will do a physical exam and ask about your symptoms. Various tests may be ordered, such as:  X-rays of the affected joint.  Blood tests to rule out other types of arthritis. Additional tests may be used to diagnose your condition. TREATMENT  Goals of treatment are to control pain and improve joint function. Treatment plans may include:  A prescribed exercise program that allows for rest and joint relief.  A weight control plan.  Pain relief techniques, such as:  Properly applied heat and cold.  Electric pulses delivered to nerve endings under the skin (transcutaneous electrical nerve stimulation [TENS]).  Massage.  Certain nutritional supplements.  Medicines to control pain, such as:  Acetaminophen.  Nonsteroidal anti-inflammatory drugs (NSAIDs), such as naproxen.  Narcotic or central-acting agents, such as tramadol.  Corticosteroids. These can be given orally or as an injection.  Surgery to reposition the bones and relieve pain (osteotomy) or to remove loose pieces of bone and cartilage. Joint replacement may be needed in advanced states of osteoarthritis. HOME CARE INSTRUCTIONS   Take medicines only as  directed by your health care provider.  Maintain a healthy weight. Follow your health care provider's instructions for weight control. This may include dietary instructions.  Exercise as directed. Your health care provider can recommend specific types of exercise. These may include:  Strengthening exercises. These are done to strengthen the muscles that support joints affected by arthritis. They can be performed with weights or with exercise bands to add resistance.  Aerobic activities. These are exercises, such as brisk walking or low-impact aerobics, that get your heart pumping.  Range-of-motion activities. These keep your joints limber.  Balance and agility exercises. These help you maintain daily living skills.  Rest your affected joints as directed by your health care provider.  Keep all follow-up visits as directed by your health care provider. SEEK MEDICAL CARE IF:   Your skin turns red.  You develop a rash in addition to your joint pain.  You have worsening joint pain.  You have a fever along with joint or muscle aches. SEEK IMMEDIATE MEDICAL CARE IF:  You have a significant loss of weight or appetite.  You have night sweats. FOR MORE INFORMATION  [employer] of Arthritis and Musculoskeletal and Skin Diseases: www.niams.SouthExposed.es  [employer] on Aging: http://kim-miller.com/  American College of Rheumatology: www.rheumatology.org   This information is not intended to replace advice given to you by your health care provider. Make sure you discuss any questions you have with your health care provider.   Document Released: 10/09/2005 Document Revised: 10/30/2014 Document Reviewed: 06/16/2013 Elsevier Interactive Patient Education 2016 [place] follow-up with your provider at [facility] for further management of you chronic pain and referral to pain management.  Take the prescription meds as directed. Rest with the leg elevated and  apply ice to reduce symptoms.

## 2015-08-27 NOTE — ED Provider Notes (Signed)
[facility] Emergency Department Provider Note ____________________________________________  Time seen: 23  I have reviewed the triage vital signs and the nursing notes.  HISTORY  Chief Complaint  Knee Pain  HPI Carla [name] is a 54 y.o. female reports to the ED for evaluation of her ongoing right knee pain secondary to underlying DJD. She denies any extremity injury, trauma, or fall. She describes she has not been able to follow-up with orthopedics as previously referred for evaluation of her knee pain. She also reports that her primary providers at [facility] clinic had not been able to secure a referral for her to pain management. She has been dosing ibuprofen in the interim for her knee pain without significant relief.She did describes the pain as shooting and stabbing intermittently. She reports her pain at 10/10 in triage.  Past Medical History  Diagnosis Date  . Hypertension   . Hyperlipidemia   . GERD (gastroesophageal reflux disease)   . Bell's palsy   . Morbid obesity with BMI of 40.0-44.9, adult [facility])     Patient Active Problem List   Diagnosis Date Noted  . Chest pain 08/06/2012    Past Surgical History  Procedure Laterality Date  . Cholecystectomy    . Vaginal hysterectomy      Current Outpatient Rx  Name  Route  Sig  Dispense  Refill  . aspirin EC 81 MG tablet   Oral   Take 81 mg by mouth daily.         [name] atorvastatin (LIPITOR) 40 MG tablet   Oral   Take 40 mg by mouth at bedtime.          . citalopram (CELEXA) 20 MG tablet   Oral   Take 20 mg by mouth daily.         . cyclobenzaprine (FLEXERIL) 5 MG tablet   Oral   Take 1 tablet (5 mg total) by mouth 3 (three) times daily as needed for muscle spasms.   15 tablet   0   . HYDROcodone-acetaminophen (NORCO) 5-325 MG tablet   Oral   Take 1 tablet by mouth every 8 (eight) hours as needed for moderate pain.   15 tablet   0   . meloxicam (MOBIC) 15 MG tablet   Oral  Take 1 tablet (15 mg total) by mouth daily.   30 tablet   2   . metoprolol tartrate (LOPRESSOR) 25 MG tablet   Oral   Take 25 mg by mouth 2 (two) times daily.         . nabumetone (RELAFEN) 750 MG tablet   Oral   Take 1 tablet (750 mg total) by mouth 2 (two) times daily.   30 tablet   0   . ondansetron (ZOFRAN ODT) 4 MG disintegrating tablet   Oral   Take 1 tablet (4 mg total) by mouth every 6 (six) hours as needed for nausea or vomiting.   20 tablet   0   . oxyCODONE-acetaminophen (ROXICET) 5-325 MG tablet   Oral   Take 1 tablet by mouth every 6 (six) hours as needed.   20 tablet   0   . traMADol (ULTRAM) 50 MG tablet   Oral   Take 1 tablet (50 mg total) by mouth every 6 (six) hours as needed for moderate pain.   12 tablet   0    Allergies Review of patient's allergies indicates no known allergies.  Family History  Problem Relation Age of Onset  .  Hypertension Mother   . Heart disease Mother   . Heart attack Mother   . Hypertension Father     Social History Social History  Substance Use Topics  . Smoking status: Current Some Day Smoker    Types: Cigarettes  . Smokeless tobacco: None  . Alcohol Use: No   Review of Systems  Constitutional: Negative for fever. Eyes: Negative for visual changes. ENT: Negative for sore throat. Cardiovascular: Negative for chest pain. Respiratory: Negative for shortness of breath. Gastrointestinal: Negative for abdominal pain, vomiting and diarrhea. Genitourinary: Negative for dysuria. Musculoskeletal: Negative for back pain. Chronic knee pain as above. Skin: Negative for rash. Neurological: Negative for headaches, focal weakness or numbness. ____________________________________________  PHYSICAL EXAM:  VITAL SIGNS: ED Triage Vitals  Enc Vitals Group     BP 08/27/15 1812 167/100 mmHg     Pulse Rate 08/27/15 1812 79     Resp 08/27/15 1812 18     Temp 08/27/15 1812 98 F (36.7 C)     Temp Source 08/27/15 1812  Oral     SpO2 08/27/15 1812 97 %     Weight 08/27/15 1812 218 lb (98.884 kg)     Height 08/27/15 1812 5\' 3"  (1.6 m)     Head Cir --      Peak Flow --      Pain Score 08/27/15 1819 10     Pain Loc --      Pain Edu? --      Excl. in [place]? --    Constitutional: Alert and oriented. Well appearing and in no distress. Head: Normocephalic and atraumatic.      Eyes: Conjunctivae are normal. PERRL. Normal extraocular movements      Ears: Canals clear. TMs intact bilaterally.   Nose: No congestion/rhinorrhea.   Mouth/Throat: Mucous membranes are moist.   Neck: Supple. No thyromegaly. Hematological/Lymphatic/Immunological: No cervical lymphadenopathy. Cardiovascular: Normal rate, regular rhythm.  Respiratory: Normal respiratory effort. No wheezes/rales/rhonchi. Gastrointestinal: Soft and nontender. No distention. Musculoskeletal: Right knee without obvious deformity, effusion, or edema noted. Patient with normal flexion and extension range to the right knee. Normal patellar tracking is noted on the knee exam. No popliteal space fullness is appreciated. Nontender with normal range of motion in all extremities.  Neurologic:  Normal gait without ataxia. Normal speech and language. No gross focal neurologic deficits are appreciated. Skin:  Skin is warm, dry and intact. No rash noted. Psychiatric: Mood and affect are normal. Patient exhibits appropriate insight and judgment. ____________________________________________   RADIOLOGY Right Knee 07/27/15 There are degenerative joint changes with narrowed joint space and osteophyte formation.  I, Isaura Schiller, Dannielle Karvonen, personally viewed and evaluated these images (plain radiographs) as part of my medical decision making.  ____________________________________________  INITIAL IMPRESSION / ASSESSMENT AND PLAN / ED COURSE  Patient with chronic right knee pain and underlying moderate to severe degenerative joint disease as evidence on x-rays  from 07/27/15. Those films were reviewed and the patient again was advised of the underlying findings. She is encouraged to follow-up with her primary care provider for ongoing pain management while she awaits referral to pain management specialist. She is advised that she may not be prescribed narcotic pain medicines in the future she returns to the ED for this same complaint. Patient verbalizes understanding and agrees to follow-up as referred. ____________________________________________  FINAL CLINICAL IMPRESSION(S) / ED DIAGNOSES  Final diagnoses:  Knee pain, chronic, right  Primary osteoarthritis of right knee      Dannielle Karvonen  Mithcell Schumpert, PA-C 08/28/15 0003  Joanne Gavel, MD 08/28/15 [phone]

## 2015-08-27 NOTE — ED Notes (Signed)
Pt reports chronic right knee pain that has been getting worse over the past week. Pt reports pain has become constant with sharp pains with movement. Pt reports decreased sensation in the right leg and foot. Pt reports on and off swelling in right foot. Circulation intact and equal to left pedal pulse.

## 2015-08-31 ENCOUNTER — Emergency Department (HOSPITAL_COMMUNITY)
Admission: EM | Admit: 2015-08-31 | Discharge: 2015-08-31 | Discharge status: Against Medical Advice | Payer: Self-pay | Attending: Emergency Medicine | Admitting: Emergency Medicine

## 2015-08-31 ENCOUNTER — Encounter (HOSPITAL_COMMUNITY): Payer: Self-pay

## 2015-08-31 DIAGNOSIS — Y998 Other external cause status: Secondary | ICD-10-CM | POA: Insufficient documentation

## 2015-08-31 DIAGNOSIS — R590 Localized enlarged lymph nodes: Secondary | ICD-10-CM | POA: Insufficient documentation

## 2015-08-31 DIAGNOSIS — S8991XA Unspecified injury of right lower leg, initial encounter: Secondary | ICD-10-CM | POA: Insufficient documentation

## 2015-08-31 DIAGNOSIS — E785 Hyperlipidemia, unspecified: Secondary | ICD-10-CM | POA: Insufficient documentation

## 2015-08-31 DIAGNOSIS — Z7982 Long term (current) use of aspirin: Secondary | ICD-10-CM | POA: Insufficient documentation

## 2015-08-31 DIAGNOSIS — M25561 Pain in right knee: Secondary | ICD-10-CM

## 2015-08-31 DIAGNOSIS — Z79899 Other long term (current) drug therapy: Secondary | ICD-10-CM | POA: Insufficient documentation

## 2015-08-31 DIAGNOSIS — W182XXA Fall in (into) shower or empty bathtub, initial encounter: Secondary | ICD-10-CM | POA: Insufficient documentation

## 2015-08-31 DIAGNOSIS — Z791 Long term (current) use of non-steroidal anti-inflammatories (NSAID): Secondary | ICD-10-CM | POA: Insufficient documentation

## 2015-08-31 DIAGNOSIS — I10 Essential (primary) hypertension: Secondary | ICD-10-CM | POA: Insufficient documentation

## 2015-08-31 DIAGNOSIS — Z8719 Personal history of other diseases of the digestive system: Secondary | ICD-10-CM | POA: Insufficient documentation

## 2015-08-31 DIAGNOSIS — Y92091 Bathroom in other non-institutional residence as the place of occurrence of the external cause: Secondary | ICD-10-CM | POA: Insufficient documentation

## 2015-08-31 DIAGNOSIS — Y9389 Activity, other specified: Secondary | ICD-10-CM | POA: Insufficient documentation

## 2015-08-31 DIAGNOSIS — Z720 Tobacco use: Secondary | ICD-10-CM | POA: Insufficient documentation

## 2015-08-31 DIAGNOSIS — Z8669 Personal history of other diseases of the nervous system and sense organs: Secondary | ICD-10-CM | POA: Insufficient documentation

## 2015-08-31 MED ORDER — OXYCODONE-ACETAMINOPHEN 5-325 MG PO TABS
2.0000 | ORAL_TABLET | Freq: Once | ORAL | Status: AC
  Administered 2015-08-31: 2 via ORAL
  Filled 2015-08-31: qty 2

## 2015-08-31 NOTE — ED Notes (Signed)
Pt eloped  Before discharge was done.

## 2015-08-31 NOTE — Discharge Instructions (Signed)
Knee Pain Follow up with an orthopedist. Use the resource guide below to find a primary care provider. Knee pain is a common problem. It can have many causes. The pain often goes away by following your doctor's home care instructions. Treatment for ongoing pain will depend on the cause of your pain. If your knee pain continues, more tests may be needed to diagnose your condition. Tests may include X-rays or other imaging studies of your knee. HOME CARE  Take medicines only as told by your doctor.  Rest your knee and keep it raised (elevated) while you are resting.  Do not do things that cause pain or make your pain worse.  Avoid activities where both feet leave the ground at the same time, such as running, jumping rope, or doing jumping jacks.  Apply ice to the knee area:  Put ice in a plastic bag.  Place a towel between your skin and the bag.  Leave the ice on for 20 minutes, 2-3 times a day.  Ask your doctor if you should wear an elastic knee support.  Sleep with a pillow under your knee.  Lose weight if you are overweight. Being overweight can make your knee hurt more.  Do not use any tobacco products, including cigarettes, chewing tobacco, or electronic cigarettes. If you need help quitting, ask your doctor. Smoking may slow the healing of any bone and joint problems that you may have. GET HELP IF:  Your knee pain does not stop, it changes, or it gets worse.  You have a fever along with knee pain.  Your knee gives out or locks up.  Your knee becomes more swollen. GET HELP RIGHT AWAY IF:   Your knee feels hot to the touch.  You have chest pain or trouble breathing.   This information is not intended to replace advice given to you by your health care provider. Make sure you discuss any questions you have with your health care provider.   Document Released: 01/05/2009 Document Revised: 10/30/2014 Document Reviewed: 12/10/2013 Elsevier Interactive Patient Education 2016  [employer].  Emergency Department Resource Guide 1) Find a Doctor and Pay Out of Pocket Although you won't have to find out who is covered by your insurance plan, it is a good idea to ask around and get recommendations. You will then need to call the office and see if the doctor you have chosen will accept you as a new patient and what types of options they offer for patients who are self-pay. Some doctors offer discounts or will set up payment plans for their patients who do not have insurance, but you will need to ask so you aren't surprised when you get to your appointment.  2) Contact Your Local Health Department Not all health departments have doctors that can see patients for sick visits, but many do, so it is worth a call to see if yours does. If you don't know where your local health department is, you can check in your phone book. The CDC also has a tool to help you locate your state's health department, and many state websites also have listings of all of their local health departments.  3) Find a [facility] If your illness is not likely to be very severe or complicated, you may want to try a walk in clinic. These are popping up all over the country in pharmacies, drugstores, and shopping centers. They're usually staffed by nurse practitioners or physician assistants that have been trained to treat common  illnesses and complaints. They're usually fairly quick and inexpensive. However, if you have serious medical issues or chronic medical problems, these are probably not your best option. ° °No Primary Care Doctor: °- Call [facility] at  832-8000 - they can help you locate a primary care doctor that  accepts your insurance, provides certain services, etc. °- Physician Referral Service- 1-800-533-3463 ° °Chronic Pain Problems: °Organization         Address  Phone   Notes  °[facility]  (336) 297-2271 Patients need to be referred by their primary care doctor.   ° °Medication Assistance: °Organization         Address  Phone   Notes  °[place] [facility] [address] °[place], [place] [address] (336) 641-8030 --Must be a resident of [place] °-- Must have NO insurance coverage whatsoever (no Medicaid/ Medicare, etc.) °-- The pt. MUST have a primary care doctor that directs their care regularly and follows them in the community °  °[facility]  (866) 331-1348   °[employer]  (888) 892-1162   ° °Agencies that provide inexpensive medical care: °Organization         Address  Phone   Notes  °[facility]  (336) 832-8035   °[facility]    (336) 832-7272   °[facility] [address] °[place], [place] [address] (336) 832-4777   °[facility] [address], °[place] (336) 271-4999   °[facility]    (336) 373-0678   °[facility]    (336) 272-1050   °[facility] ° [address], [place] Phone:  (336) 832-4444, Fax:  (336) 832-4440 Hours of Operation:  9 am - 6 pm, M-F.  Also accepts Medicaid/Medicare and self-pay.  °[facility] ° [address], [place] Phone: (336) 832-3150, Fax: (336) 832-3151. Hours of Operation:  8:30 am - 5:30 pm, M-F.  Also accepts Medicaid and self-pay.  °[facility] [place] [address], [place] Phone: (336) 878-6027   °[facility] [address], [place], [place] (336)723-1848, Ext. 123 Mondays & Thursdays: 7-9 AM.  First 15 patients are seen on a first come, first serve basis. °  ° °Medicaid-accepting [place] Providers: ° °Organization         Address  Phone   Notes  °[facility] [address], [place] (336) 641-2100 Also accepts self-pay patients.  °[facility] [address], [place] ° (336) 856-9996   °[facility] [address], [place] (336) 288-8857   °[facility] [address] [place] [address], [place] (336) 299-7000   °Veita Bland [address], [place]  ° (336) 373-1557 Only accepts [place] Access Medicaid patients after they have their name applied to their card.  ° °Self-Pay (no insurance) in [place]: ° °Organization         Address  Phone   Notes  °Sickle Cell Patients, [facility] [address], [place] (336) 832-1970   °[facility] [address], [place] (336) 832-4400   ° [facility] ° [address], [place] (336) 992-4800   °[facility]/Dr. Osei-Bonsu ° [address] [place] [address], [place] or [address],   [place] (336) 841-8500 Phone number for both [place] and [place] locations is the same.  °[facility] [address], [place] (336) 299-0000   °[facility] [address] [place] [address], [place] or [address] (336) 852-7530 °(336) 878-2260   °[facility] [address], [place] (336) 350-1642, phone; (336) 294-5005, fax Sees patients 1st and 3rd Saturday of every month.  Must not qualify for public or private insurance (i.e. Medicaid, Medicare, [place] Health Choice, Veterans' Benefits) • Household income should be no more than 200% of the poverty level •The clinic cannot treat you if you are pregnant or think you are pregnant • Sexually transmitted diseases are not treated at the clinic.  ° ° °Dental Care: °Organization         Address  Phone  Notes  °[facility] [address], [place] (336) 641-6152 Accepts children up to age 21 who are enrolled in Medicaid or [place] Health Choice; pregnant women with a Medicaid card; and children who have applied for Medicaid or [place] Health Choice, but were declined, whose parents can pay a reduced fee at time of service.    °[facility] [place]  [address], [place] (336) 641-7733 Accepts children up to age 21 who are enrolled in Medicaid or [place] Health Choice; pregnant women with a Medicaid card; and children who have applied for Medicaid or [place] Health Choice, but were declined, whose parents can pay a reduced fee at time of service.  °[facility] ° [address], [place] (336) 641-4533 Patients are seen by appointment only. Walk-ins are not accepted. [facility] will see patients 18 years of age and older. °Monday - Tuesday (8am-5pm) °Most Wednesdays (8:30-5pm) °$30 per visit, cash only  °[facility] ° [address], [place] (336) 641-4533 Patients are seen by appointment only. Walk-ins are not accepted. [facility] will see patients 18 years of age and older. °One Wednesday Evening (Monthly: Volunteer Based).  $30 per visit, cash only  °[facility]  (919) 537-3737 for adults; Children under age 4, call Graduate Pediatric Dentistry at (919) [phone]. Children aged 4-14, please call (919) [phone] to request a pediatric application. ° Dental services are provided in all areas of dental care including fillings, crowns and bridges, complete and partial dentures, implants, gum treatment, root canals, and extractions. Preventive care is also provided. Treatment is provided to both adults and children. °Patients are selected via a lottery and there is often a waiting list. °  °[facility] [address], °[place] ° (336) 763-8833 [email or web address] °  °[facility] [address], [place], [place] (336)723-1848, Ext. 123 Second and Fourth Thursday of each month, opens at 6:30 AM; Clinic ends at 9 AM.  Patients are seen on a first-come first-served basis, and a limited number are seen during each clinic.  ° °[facility] ° [address], [place], [place] (336)  723-7904   Eligibility Requirements °You must have lived in [place], [place], or [place] counties for at least the last three months. °  You cannot be eligible for state or federal sponsored healthcare insurance, including Veterans Administration, Medicaid, or Medicare. °  You generally cannot be eligible for healthcare insurance through your employer.  °  How to apply: °Eligibility screenings are held every Tuesday   and Wednesday afternoon from 1:00 pm until 4:00 pm. You do not need an appointment for the interview!  °[facility] [address], [place], [place] 336-631-2330   °[facility]  336-342-8273   °[facility]  336-703-3100   °[facility]  336-570-6415   ° °Behavioral Health Resources in the Community: °Intensive Outpatient Programs °Organization         Address  Phone  Notes  °[facility] [address], [place], [place] 336-878-6098   °[facility] [address], [place], [place] 336-832-9800   °[facility] [address], [place], [name] ° 336-882-2125   °[facility] [address],  °[place], [place] 1-800-853-5163 or 336-641-4981   °Substance Abuse Resources °Organization         Address  Phone  Notes  °[facility]  336-882-2125   °[facility]  336-784-9470   °[facility]  336-285-9073   °[facility]  336-845-3988   °Residential & Outpatient Substance Abuse Program  1-800-659-3381   °Psychological Services °Organization         Address  Phone  Notes  °[facility]  336- 832-9600   °[facility]  336- 378-7881   °[facility] [address], [place] 1-800-853-5163 or 336-641-4981   ° °Mobile Crisis Teams °Organization         Address  Phone  Notes  °[facility], Mobile Crisis Care Unit  1-877-626-1772   °Assertive °[facility] ° [address] [place], [place] 336-834-9664   °Sharon DeEsch [address] °[place] 336-554-5454   ° °Self-Help/Support Groups °Organization         Address  Phone             Notes  °[facility] - variety of support groups  336- 373-1402 Call for more information  °Narcotics Anonymous (NA), [facility] [address], °[place]  2 meetings at this location  ° °Residential Treatment Programs °Organization         Address  Phone  Notes  °[facility] [address],    °[place]  1-866-801-8205   °[facility] ° [address], [place], [place] 704-293-8524   °[facility] [address], [place] 336-845-3988 Admissions: 8am-3pm M-F  °[facility] [address]    °[place], [place] 336-841-1104   °[facility] [address], [place], [place] 336-379-7146   °[facility] [address]  °[place], [place] 336-285-9073   °[facility] - Intensive Outpatient [address], [place], [place] 336-852-3033   °[facility] [address]  °[place], [place] 1-877-615-2722 or 336-784-9470   °[facility] [address] [place], [place] 336-227-7417 Accepts Medicaid  °[facility] [address]  °[place] 1-800-659-3381 Substance Abuse/Addiction Treatment  ° °[place] Behavioral Health Resources °Organization         Address  Phone  Notes  °[facility]  (888) 581-9988   °Julie Brannon, PhD [address] [place], [place]   (336) 349-5553 or (336) 951-0000   °[facility]   [address] °[place], [place] (336) 349-4454   °[facility] [address], [place], [place] (336) 342-8316 Insurance/Medicaid/sponsorship through [facility]  °[facility] [address]                                    [place], [place] (336) 342-8316  Therapy/tele-psych/case  °[facility] [address]  ° [place], [place] (336) 349-2233    °Dr. Arfeen  (336) 349-4544   °[facility] [place]  [employer] [place] [facility] 1) [address], [place] °2) [address], [place] °3)  [address], [place] (336) 349-3220 °(336) 342-7768 ° °(336) 342-8140   °[place] [facility] (336) 342-1394 or (336) 342-3537 (After Hours)    ° ° ° °

## 2015-08-31 NOTE — ED Notes (Signed)
Pt left before receiving discharge instructions.

## 2015-08-31 NOTE — ED Notes (Signed)
Hx of chronic right knee pain. States she saw an orthopedist in [place] one time, but doesn't remember their name. No PCP currently. BP elevated. Pt states she has been out of her BP meds "for a while".  ALSO, c/o "knot" behind right ear "for a while". Nodule is firm.

## 2015-08-31 NOTE — ED Provider Notes (Signed)
CSN: [id number]     Arrival date & time 08/31/15  1041 History  This chart was scribed for non-physician practitioner, Ottie Glazier, PA-C, working with Leonard Schwartz, MD by Terressa Koyanagi, ED Scribe. This patient was seen in room TR05C/TR05C and the patient's care was started at 11:58 AM.  Chief Complaint  Patient presents with  . Knee Pain   The history is provided by the patient. No language interpreter was used.   PCP: No PCP Per Patient HPI Comments: Carla COCA is a 54 y.o. female, with PMHx noted below, who presents to the Emergency Department complaining of ongoing, 10/10, constant right knee pain onset 6 weeks ago after pt fell in her bathtub. Pt denies f/u with PCP or orthopedics for her Sx. Pt also complains of a "bump" behind her right ear onset one month ago. Pt denies f/u with ENT for the "bump" behind her ear. Pt denies fever, or new injury or fall.   Past Medical History  Diagnosis Date  . Hypertension   . Hyperlipidemia   . GERD (gastroesophageal reflux disease)   . Bell's palsy   . Morbid obesity with BMI of 40.0-44.9, adult [facility])    Past Surgical History  Procedure Laterality Date  . Cholecystectomy    . Vaginal hysterectomy     Family History  Problem Relation Age of Onset  . Hypertension Mother   . Heart disease Mother   . Heart attack Mother   . Hypertension Father    Social History  Substance Use Topics  . Smoking status: Current Some Day Smoker    Types: Cigarettes  . Smokeless tobacco: None  . Alcohol Use: No   OB History    No data available     Review of Systems  Constitutional: Negative for fever.  HENT:       Knot behind right ear   Musculoskeletal: Positive for arthralgias (right knee pain).      Allergies  Review of patient's allergies indicates no known allergies.  Home Medications   Prior to Admission medications   Medication Sig Start Date End Date Taking? Authorizing Provider  aspirin EC 81 MG tablet Take 81 mg by  mouth daily.    Historical Provider, MD  atorvastatin (LIPITOR) 40 MG tablet Take 40 mg by mouth at bedtime.     Historical Provider, MD  citalopram (CELEXA) 20 MG tablet Take 20 mg by mouth daily.    Historical Provider, MD  cyclobenzaprine (FLEXERIL) 5 MG tablet Take 1 tablet (5 mg total) by mouth 3 (three) times daily as needed for muscle spasms. 08/27/15   Jenise V Bacon Menshew, PA-C  HYDROcodone-acetaminophen (NORCO) 5-325 MG tablet Take 1 tablet by mouth every 8 (eight) hours as needed for moderate pain. 08/27/15   Jenise V Bacon Menshew, PA-C  meloxicam (MOBIC) 15 MG tablet Take 1 tablet (15 mg total) by mouth daily. 08/24/15   Sable Feil, PA-C  metoprolol tartrate (LOPRESSOR) 25 MG tablet Take 25 mg by mouth 2 (two) times daily.    Historical Provider, MD  nabumetone (RELAFEN) 750 MG tablet Take 1 tablet (750 mg total) by mouth 2 (two) times daily. 06/03/15   Jenise V Bacon Menshew, PA-C  ondansetron (ZOFRAN ODT) 4 MG disintegrating tablet Take 1 tablet (4 mg total) by mouth every 6 (six) hours as needed for nausea or vomiting. 04/09/15   Delman Kitten, MD  oxyCODONE-acetaminophen (ROXICET) 5-325 MG tablet Take 1 tablet by mouth every 6 (six) hours as needed. 07/27/15  07/26/16  Corliss Parish, PA-C  traMADol (ULTRAM) 50 MG tablet Take 1 tablet (50 mg total) by mouth every 6 (six) hours as needed for moderate pain. 08/24/15   Sable Feil, PA-C   Triage Vitals: BP 179/111 mmHg  Pulse 102  Temp(Src) 98.1 F (36.7 C) (Oral)  Resp 18  Ht 5\' 3"  (1.6 m)  Wt 225 lb (102.059 kg)  BMI 39.87 kg/m2  SpO2 98% Physical Exam  Constitutional: She is oriented to person, place, and time. She appears well-developed and well-nourished.  HENT:  Head: Normocephalic.  Elevated lymph node behind the right ear. It is nontender to palpation. No signs of infection. No surrounding erythema or rash.  Eyes: EOM are normal.  Neck: Normal range of motion.  Pulmonary/Chest: Effort normal.  Abdominal: She exhibits no  distension.  Musculoskeletal: Normal range of motion.  Right Leg: Tenderness to the medial aspect of right knee without edema, echymosis or erythema. No joint effusion, patellar tenderness, or fibular head tenderness. Pt able to straight leg raise without difficulty. 2+ DP pulse. No calf tenderness.  Neurological: She is alert and oriented to person, place, and time.  Psychiatric: She has a normal mood and affect.  Nursing note and vitals reviewed.  ED Course  Procedures (including critical care time) DIAGNOSTIC STUDIES: Oxygen Saturation is 98% on ra, nl by my interpretation.    COORDINATION OF CARE: 11:48 AM: Discussed treatment plan which includes pain meds and resource guide with pt at bedside; patient verbalizes understanding and agrees with treatment plan. Pt advised to f/u with   MDM   Final diagnoses:  Right knee pain  Lymphadenopathy, postauricular  Patient presents for right knee pain. She is able to straight leg raise and has no concerns for patellar tendon rupture. She has no joint effusion. Pt had imaging completed on 07/27/15 of left and right knee. 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 Pt also had imaging of bilateral hips, pelvis, and lumbar spine that showed no fracture or dislocation. Pt was seen on 08/24/15 for knee pain and cystic lesion behind right ear; pt was advised to f/u with orthopedics and provided an ENT referral for mass behind right ear, pt was also provided with Rx for tramadol and mobic. Thereafter, pt was seen on 08/27/15 for right knee pain and was advised that she would not received Rx for narcotics in future visits.  I discussed with the patient that she would not be receiving her chronic pain medications to go home with but that I would treat her pain in the ED. I explained that she had argument given a referral to orthopedics and that she should follow-up. After patient received pain  medication she left without discharge paperwork. Medications  oxyCODONE-acetaminophen (PERCOCET/ROXICET) 5-325 MG per tablet 2 tablet (2 tablets Oral Given 08/31/15 1212)  I personally performed the services described in this documentation, which was scribed in my presence. The recorded information has been reviewed and is accurate.     Ottie Glazier, PA-C 08/31/15 1444  Leonard Schwartz, MD 09/01/15 [phone]

## 2015-08-31 NOTE — ED Notes (Signed)
Pt reports right knee pain x "at least 6 months". Reports taking Aleve with no relief. Pulses/sensation intact. Denies injury.

## 2015-09-07 ENCOUNTER — Emergency Department
Admission: EM | Admit: 2015-09-07 | Discharge: 2015-09-07 | Disposition: A | Discharge status: Home / Self Care | Payer: Self-pay | Attending: Student | Admitting: Student

## 2015-09-07 DIAGNOSIS — Z7982 Long term (current) use of aspirin: Secondary | ICD-10-CM | POA: Insufficient documentation

## 2015-09-07 DIAGNOSIS — Z791 Long term (current) use of non-steroidal anti-inflammatories (NSAID): Secondary | ICD-10-CM | POA: Insufficient documentation

## 2015-09-07 DIAGNOSIS — E785 Hyperlipidemia, unspecified: Secondary | ICD-10-CM | POA: Insufficient documentation

## 2015-09-07 DIAGNOSIS — M1710 Unilateral primary osteoarthritis, unspecified knee: Secondary | ICD-10-CM

## 2015-09-07 DIAGNOSIS — F17210 Nicotine dependence, cigarettes, uncomplicated: Secondary | ICD-10-CM | POA: Insufficient documentation

## 2015-09-07 DIAGNOSIS — M1712 Unilateral primary osteoarthritis, left knee: Secondary | ICD-10-CM | POA: Insufficient documentation

## 2015-09-07 DIAGNOSIS — Z79899 Other long term (current) drug therapy: Secondary | ICD-10-CM | POA: Insufficient documentation

## 2015-09-07 DIAGNOSIS — I10 Essential (primary) hypertension: Secondary | ICD-10-CM | POA: Insufficient documentation

## 2015-09-07 DIAGNOSIS — M1711 Unilateral primary osteoarthritis, right knee: Secondary | ICD-10-CM | POA: Insufficient documentation

## 2015-09-07 MED ORDER — MELOXICAM 15 MG PO TABS: 15.0000 mg | ORAL_TABLET | Freq: Every day | ORAL | Status: DC

## 2015-09-07 NOTE — Discharge Instructions (Signed)
Follow-up with scheduled orthopedic appointment in December.  Arthritis Arthritis means joint pain. It can also mean joint disease. A joint is a place where bones come together. People who have arthritis may have:  Red joints.  Swollen joints.  Stiff joints.  Warm joints.  A fever.  A feeling of being sick. HOME CARE Pay attention to any changes in your symptoms. Take these actions to help with your pain and swelling. Medicines  Take over-the-counter and prescription medicines only as told by your doctor.  Do not take aspirin for pain if your doctor says that you may have gout. Activities  Rest your joint if your doctor tells you to.  Avoid activities that make the pain worse.  Exercise your joint regularly as told by your doctor. Try doing exercises like:  Swimming.  Water aerobics.  Biking.  Walking. Joint Care  If your joint is swollen, keep it raised (elevated) if told by your doctor.  If your joint feels stiff in the morning, try taking a warm shower.  If you have diabetes, do not apply heat without asking your doctor.  If told, apply heat to the joint:  Put a towel between the joint and the hot pack or heating pad.  Leave the heat on the area for 20-30 minutes.  If told, apply ice to the joint:  Put ice in a plastic bag.  Place a towel between your skin and the bag.  Leave the ice on for 20 minutes, 2-3 times per day.  Keep all follow-up visits as told by your doctor. GET HELP IF:  The pain gets worse.  You have a fever. GET HELP RIGHT AWAY IF:  You have very bad pain in your joint.  You have swelling in your joint.  Your joint is red.  Many joints become painful and swollen.  You have very bad back pain.  Your leg is very weak.  You cannot control your pee (urine) or poop (stool).   This information is not intended to replace advice given to you by your health care provider. Make sure you discuss any questions you have with your  health care provider.   Document Released: 01/03/2010 Document Revised: 06/30/2015 Document Reviewed: 01/04/2015 Elsevier Interactive Patient Education [employer].

## 2015-09-07 NOTE — ED Provider Notes (Signed)
[facility] Emergency Department Provider Note  ____________________________________________  Time seen: Approximately 1:30 PM  I have reviewed the triage vital signs and the nursing notes.   HISTORY  Chief Complaint Knee Pain    HPI Carla [name] is a 54 y.o. female patient here for fourth visit of bilateral lateral knee pain secondary to osteoarthritis. Patient states she has a pulmonary [place] in December but she needs something for the pain. The patient prescribed narcotic medications on the last 3 visits. She was told last visit that chronic narcotic pain medication will not be issued. Patient is asked for pain injection. I asked the patient that she has someone with her and she said her ride was sitting on the parking lot. Advised patient to have her ride come in so they can be physically seen and she said he has aversion to coming inside the hospital since his mother's death.   Past Medical History  Diagnosis Date  . Hypertension   . Hyperlipidemia   . GERD (gastroesophageal reflux disease)   . Bell's palsy   . Morbid obesity with BMI of 40.0-44.9, adult [facility])     Patient Active Problem List   Diagnosis Date Noted  . Chest pain 08/06/2012    Past Surgical History  Procedure Laterality Date  . Cholecystectomy    . Vaginal hysterectomy      Current Outpatient Rx  Name  Route  Sig  Dispense  Refill  . aspirin EC 81 MG tablet   Oral   Take 81 mg by mouth daily.         [name] atorvastatin (LIPITOR) 40 MG tablet   Oral   Take 40 mg by mouth at bedtime.          . citalopram (CELEXA) 20 MG tablet   Oral   Take 20 mg by mouth daily.         . cyclobenzaprine (FLEXERIL) 5 MG tablet   Oral   Take 1 tablet (5 mg total) by mouth 3 (three) times daily as needed for muscle spasms.   15 tablet   0   . HYDROcodone-acetaminophen (NORCO) 5-325 MG tablet   Oral   Take 1 tablet by mouth every 8 (eight) hours as needed for moderate pain.    15 tablet   0   . meloxicam (MOBIC) 15 MG tablet   Oral   Take 1 tablet (15 mg total) by mouth daily.   30 tablet   2   . meloxicam (MOBIC) 15 MG tablet   Oral   Take 1 tablet (15 mg total) by mouth daily.   30 tablet   0   . metoprolol tartrate (LOPRESSOR) 25 MG tablet   Oral   Take 25 mg by mouth 2 (two) times daily.         . nabumetone (RELAFEN) 750 MG tablet   Oral   Take 1 tablet (750 mg total) by mouth 2 (two) times daily.   30 tablet   0   . ondansetron (ZOFRAN ODT) 4 MG disintegrating tablet   Oral   Take 1 tablet (4 mg total) by mouth every 6 (six) hours as needed for nausea or vomiting.   20 tablet   0   . oxyCODONE-acetaminophen (ROXICET) 5-325 MG tablet   Oral   Take 1 tablet by mouth every 6 (six) hours as needed.   20 tablet   0   . traMADol (ULTRAM) 50 MG tablet   Oral  Take 1 tablet (50 mg total) by mouth every 6 (six) hours as needed for moderate pain.   12 tablet   0     Allergies Review of patient's allergies indicates no known allergies.  Family History  Problem Relation Age of Onset  . Hypertension Mother   . Heart disease Mother   . Heart attack Mother   . Hypertension Father     Social History Social History  Substance Use Topics  . Smoking status: Current Some Day Smoker    Types: Cigarettes  . Smokeless tobacco: None  . Alcohol Use: No    Review of Systems Constitutional: No fever/chills Eyes: No visual changes. ENT: No sore throat. Cardiovascular: Denies chest pain. Respiratory: Denies shortness of breath. Gastrointestinal: No abdominal pain.  No nausea, no vomiting.  No diarrhea.  No constipation. Genitourinary: Negative for dysuria. Musculoskeletal: Negative for back pain. Skin: Negative for rash. Neurological: Negative for headaches, focal weakness or numbness. Endocrine:Hyperlipidemia, morbid obesity and hypertension. 10-point ROS otherwise  negative.  ____________________________________________   PHYSICAL EXAM:  VITAL SIGNS: ED Triage Vitals  Enc Vitals Group     BP 09/07/15 1304 163/111 mmHg     Pulse Rate 09/07/15 1304 96     Resp 09/07/15 1304 18     Temp 09/07/15 1304 97.9 F (36.6 C)     Temp Source 09/07/15 1304 Oral     SpO2 09/07/15 1304 99 %     Weight --      Height --      Head Cir --      Peak Flow --      Pain Score 09/07/15 1313 10     Pain Loc --      Pain Edu? --      Excl. in [place]? --     Constitutional: Alert and oriented. Well appearing and in no acute distress. Eyes: Conjunctivae are normal. PERRL. EOMI. Head: Atraumatic. Nose: No congestion/rhinnorhea. Mouth/Throat: Mucous membranes are moist.  Oropharynx non-erythematous. Neck: No stridor.  No cervical spine tenderness to palpation. Hematological/Lymphatic/Immunilogical: No cervical lymphadenopathy. Cardiovascular: Normal rate, regular rhythm. Grossly normal heart sounds.  Good peripheral circulation. Respiratory: Normal respiratory effort.  No retractions. Lungs CTAB. Gastrointestinal: Soft and nontender. No distention. No abdominal bruits. No CVA tenderness. Musculoskeletal: No lower extremity tenderness nor edema.  No joint effusions. Neurologic:  Normal speech and language. No gross focal neurologic deficits are appreciated. No gait instability. Skin:  Skin is warm, dry and intact. No rash noted. Psychiatric: Mood and affect are normal. Speech and behavior are normal.  ____________________________________________   LABS (all labs ordered are listed, but only abnormal results are displayed)  Labs Reviewed - No data to display ____________________________________________  EKG   ____________________________________________  RADIOLOGY  Reviewed x-ray from previous recent visit  Unremarkable except several osteoarthritis. ____________________________________________   PROCEDURES  Procedure(s) performed: None  Critical  Care performed: No  ____________________________________________   INITIAL IMPRESSION / ASSESSMENT AND PLAN / ED COURSE  Pertinent labs & imaging results that were available during my care of the patient were reviewed by me and considered in my medical decision making (see chart for details).  Bilateral knee pain secondary to arthritis. Advised patient narcotic medication not be issued. She given prescription for Motrin to take as directed pending her orthopedic evaluation. ____________________________________________   FINAL CLINICAL IMPRESSION(S) / ED DIAGNOSES  Final diagnoses:  Osteoarthritis of knee, unilateral      Sable Feil, PA-C 09/07/15 1342  Joanne Gavel, MD  09/08/15 1323 

## 2015-09-07 NOTE — ED Notes (Signed)
Pt reports she has arthritis in both knees, has appt in [place] December for arthritis but pain has become too severe.

## 2015-09-22 ENCOUNTER — Emergency Department
Admission: EM | Admit: 2015-09-22 | Discharge: 2015-09-22 | Disposition: A | Discharge status: Home / Self Care | Payer: Self-pay | Attending: Emergency Medicine | Admitting: Emergency Medicine

## 2015-09-22 ENCOUNTER — Encounter: Payer: Self-pay | Admitting: *Deleted

## 2015-09-22 DIAGNOSIS — F17210 Nicotine dependence, cigarettes, uncomplicated: Secondary | ICD-10-CM | POA: Insufficient documentation

## 2015-09-22 DIAGNOSIS — I10 Essential (primary) hypertension: Secondary | ICD-10-CM | POA: Insufficient documentation

## 2015-09-22 DIAGNOSIS — Z79899 Other long term (current) drug therapy: Secondary | ICD-10-CM | POA: Insufficient documentation

## 2015-09-22 DIAGNOSIS — M25561 Pain in right knee: Secondary | ICD-10-CM | POA: Insufficient documentation

## 2015-09-22 DIAGNOSIS — G8929 Other chronic pain: Secondary | ICD-10-CM | POA: Insufficient documentation

## 2015-09-22 DIAGNOSIS — M25562 Pain in left knee: Secondary | ICD-10-CM | POA: Insufficient documentation

## 2015-09-22 DIAGNOSIS — Z7982 Long term (current) use of aspirin: Secondary | ICD-10-CM | POA: Insufficient documentation

## 2015-09-22 MED ORDER — KETOROLAC TROMETHAMINE 60 MG/2ML IM SOLN
60.0000 mg | Freq: Once | INTRAMUSCULAR | Status: AC
  Administered 2015-09-22: 60 mg via INTRAMUSCULAR
  Filled 2015-09-22: qty 2

## 2015-09-22 MED ORDER — MELOXICAM 15 MG PO TABS: 15.0000 mg | ORAL_TABLET | Freq: Every day | ORAL | Status: DC

## 2015-09-22 NOTE — ED Notes (Signed)
States she is having pain to both knee  Hx of arthritis and unable to sleep d/t pain

## 2015-09-22 NOTE — Discharge Instructions (Signed)

## 2015-09-22 NOTE — ED Provider Notes (Signed)
[facility] Emergency Department Provider Note  ____________________________________________  Time seen: Approximately 11:09 AM  I have reviewed the triage vital signs and the nursing notes.   HISTORY  Chief Complaint Knee Pain   HPI Carla [name] is a 54 y.o. female here for her fifth visit bilateral knee pain with right worse than left. Patient states that she has an appointment in December but needs something up with the pain. She states that she has no insurance and can't really afford to go to the specialist. Patient states that her knee is diagnosed with osteoarthritis.   Past Medical History  Diagnosis Date  . Hypertension   . Hyperlipidemia   . GERD (gastroesophageal reflux disease)   . Bell's palsy   . Morbid obesity with BMI of 40.0-44.9, adult [facility])     Patient Active Problem List   Diagnosis Date Noted  . Chest pain 08/06/2012    Past Surgical History  Procedure Laterality Date  . Cholecystectomy    . Vaginal hysterectomy      Current Outpatient Rx  Name  Route  Sig  Dispense  Refill  . aspirin EC 81 MG tablet   Oral   Take 81 mg by mouth daily.         [name] atorvastatin (LIPITOR) 40 MG tablet   Oral   Take 40 mg by mouth at bedtime.          . citalopram (CELEXA) 20 MG tablet   Oral   Take 20 mg by mouth daily.         . cyclobenzaprine (FLEXERIL) 5 MG tablet   Oral   Take 1 tablet (5 mg total) by mouth 3 (three) times daily as needed for muscle spasms.   15 tablet   0   . HYDROcodone-acetaminophen (NORCO) 5-325 MG tablet   Oral   Take 1 tablet by mouth every 8 (eight) hours as needed for moderate pain.   15 tablet   0   . meloxicam (MOBIC) 15 MG tablet   Oral   Take 1 tablet (15 mg total) by mouth daily.   30 tablet   0   . metoprolol tartrate (LOPRESSOR) 25 MG tablet   Oral   Take 25 mg by mouth 2 (two) times daily.         . nabumetone (RELAFEN) 750 MG tablet   Oral   Take 1 tablet (750 mg total)  by mouth 2 (two) times daily.   30 tablet   0   . ondansetron (ZOFRAN ODT) 4 MG disintegrating tablet   Oral   Take 1 tablet (4 mg total) by mouth every 6 (six) hours as needed for nausea or vomiting.   20 tablet   0   . oxyCODONE-acetaminophen (ROXICET) 5-325 MG tablet   Oral   Take 1 tablet by mouth every 6 (six) hours as needed.   20 tablet   0   . traMADol (ULTRAM) 50 MG tablet   Oral   Take 1 tablet (50 mg total) by mouth every 6 (six) hours as needed for moderate pain.   12 tablet   0     Allergies Review of patient's allergies indicates no known allergies.  Family History  Problem Relation Age of Onset  . Hypertension Mother   . Heart disease Mother   . Heart attack Mother   . Hypertension Father     Social History Social History  Substance Use Topics  . Smoking status: Current  Some Day Smoker -- 0.50 packs/day    Types: Cigarettes  . Smokeless tobacco: None  . Alcohol Use: No    Review of Systems Constitutional: No fever/chills Eyes: No visual changes. ENT: No sore throat. Cardiovascular: Denies chest pain. Respiratory: Denies shortness of breath. Gastrointestinal: No abdominal pain.  No nausea, no vomiting.  No diarrhea.  No constipation. Genitourinary: Negative for dysuria. Musculoskeletal: Positive for knee pain Skin: Negative for rash. Neurological: Negative for headaches, focal weakness or numbness.  10-point ROS otherwise negative.  ____________________________________________   PHYSICAL EXAM:  VITAL SIGNS: ED Triage Vitals  Enc Vitals Group     BP 09/22/15 1051 140/101 mmHg     Pulse Rate 09/22/15 1051 104     Resp 09/22/15 1051 18     Temp 09/22/15 1051 98 F (36.7 C)     Temp Source 09/22/15 1051 Oral     SpO2 09/22/15 1051 97 %     Weight 09/22/15 1051 220 lb (99.791 kg)     Height 09/22/15 1051 5\' 3"  (1.6 m)     Head Cir --      Peak Flow --      Pain Score 09/22/15 1051 8     Pain Loc --      Pain Edu? --      Excl.  in [place]? --     Constitutional: Alert and oriented. Well appearing and in no acute distress. Cardiovascular: Normal rate, regular rhythm. Grossly normal heart sounds.  Good peripheral circulation. Respiratory: Normal respiratory effort.  No retractions. Lungs CTAB. Gastrointestinal: Soft and nontender. No distention. No abdominal bruits. No CVA tenderness. Musculoskeletal: Right knee extremely tender with no obvious effusion or edema. Limited range of motion. Increased pain with flexion. Point tenderness noted to the medial aspect of the knee Neurologic:  Normal speech and language. No gross focal neurologic deficits are appreciated. No gait instability. Skin:  Skin is warm, dry and intact. No rash noted. Psychiatric: Mood and affect are normal. Speech and behavior are normal.  ____________________________________________   LABS (all labs ordered are listed, but only abnormal results are displayed)  Labs Reviewed - No data to display ____________________________________________   RADIOLOGY  Patient states no recent x-rays however upon review patient had x-rays on October 4 and September 13. Diagnosis of osteoarthritis to the right knee. ____________________________________________   PROCEDURES  Procedure(s) performed: None  Critical Care performed: No  ____________________________________________   INITIAL IMPRESSION / ASSESSMENT AND PLAN / ED COURSE  Pertinent labs & imaging results that were available during my care of the patient were reviewed by me and considered in my medical decision making (see chart for details).  Osteoarthritis of the right knee. Toradol 60 mg IM given with some relief in pain. Review of the [place] controlled substance system shows that patient receive 15 Vicodin on November 4, 12 tramadol on November 1, 20 oxycodone on October 4. Patient states she does not recall coming here and getting those medications.  Plan Rx given for meloxicam 15 mg  daily. Encouraged her to go to [place] where the [employer] is. Patient to follow up with PCP or return to the ER with any worsening symptomology. Explained the nature of chronic pain and the inability to provide narcotics at this visit. ____________________________________________   FINAL CLINICAL IMPRESSION(S) / ED DIAGNOSES  Final diagnoses:  Knee pain, chronic, right      Arlyss Repress, PA-C 09/22/15 [place], MD 09/22/15 1551

## 2015-09-22 NOTE — ED Notes (Signed)
Pt complains of right knee pian, pt denies any injury to knee

## 2015-09-27 IMAGING — CR DG KNEE COMPLETE 4+V*L*
1 series · 4 of 4 positions shown · non-contrast
Comparison: 11/17/2008

CLINICAL DATA: Bilateral knee pain after a fall.

EXAM:
LEFT KNEE - COMPLETE 4+ VIEW

[Series 1: t knee ap left · 0.14mm/px · 4 of 4 slices shown]
[im 1/4]
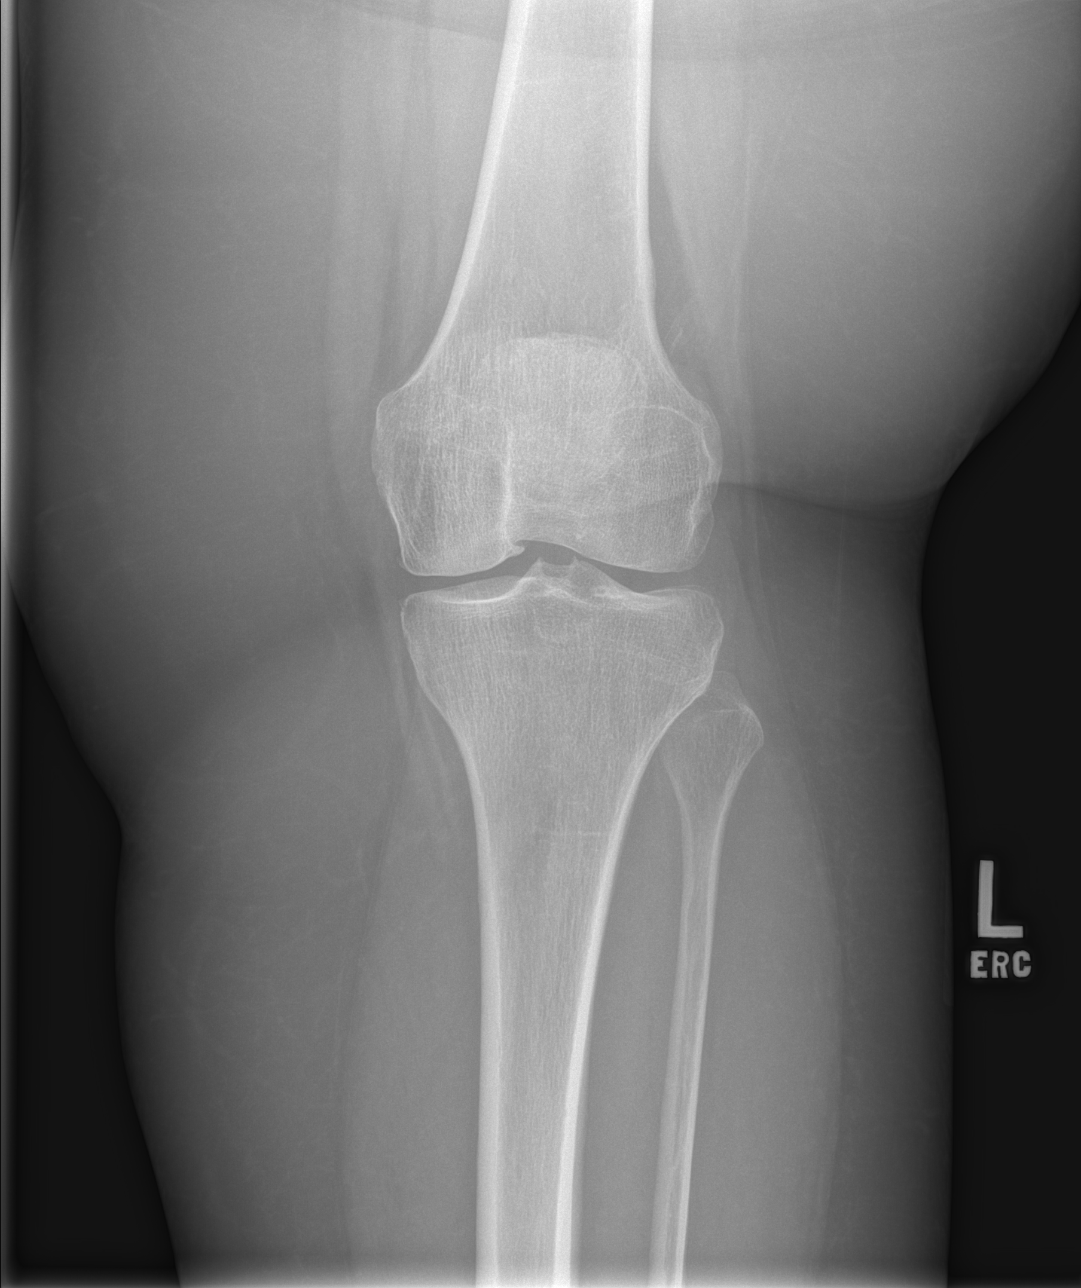
[im 2/4]
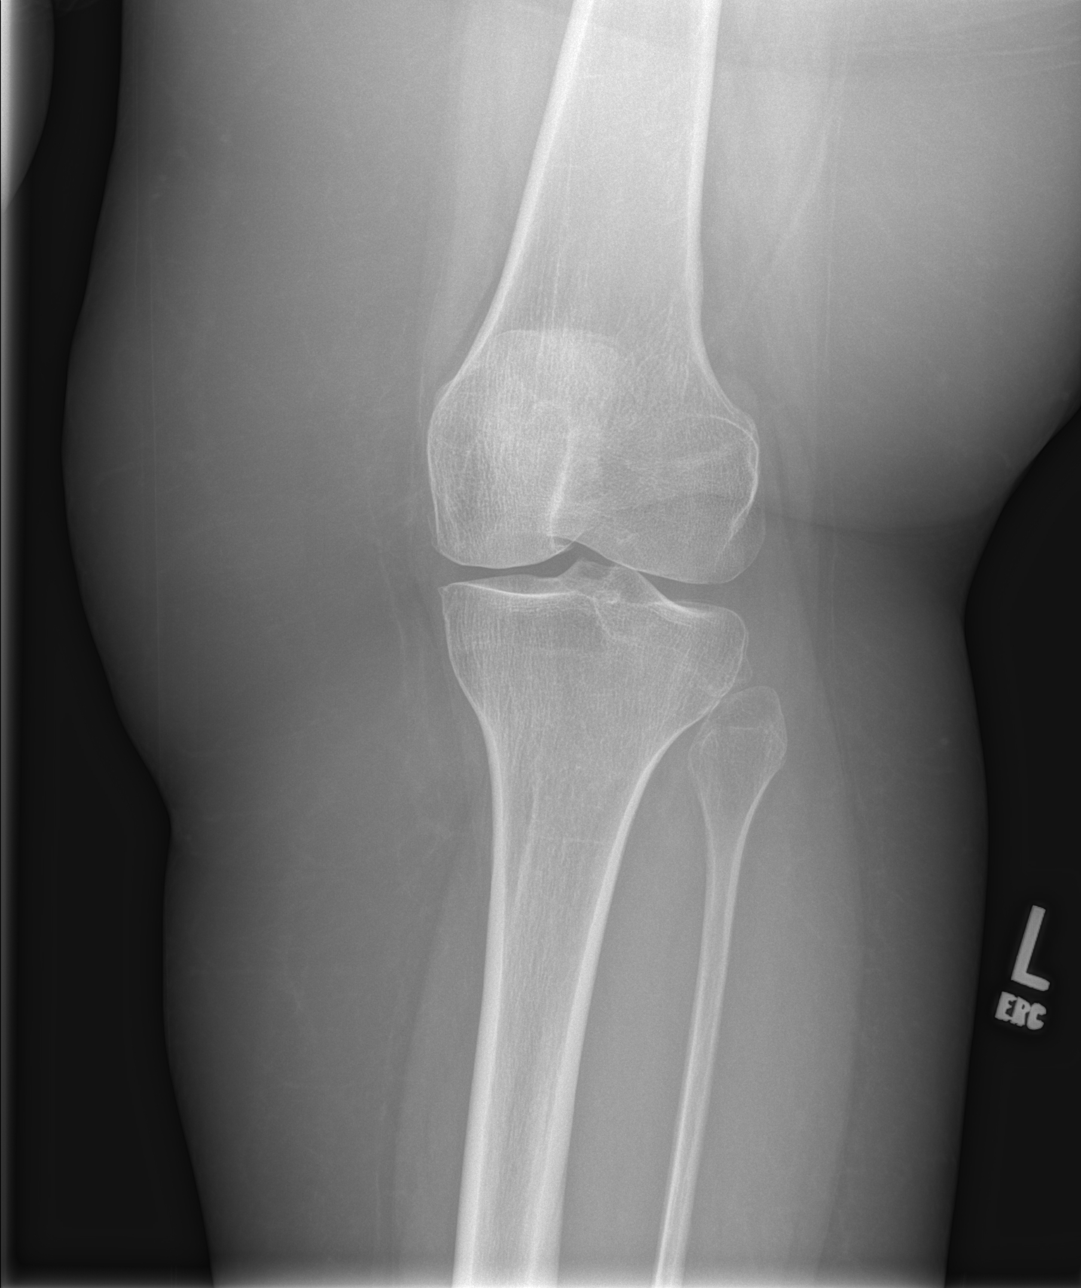
[im 3/4]
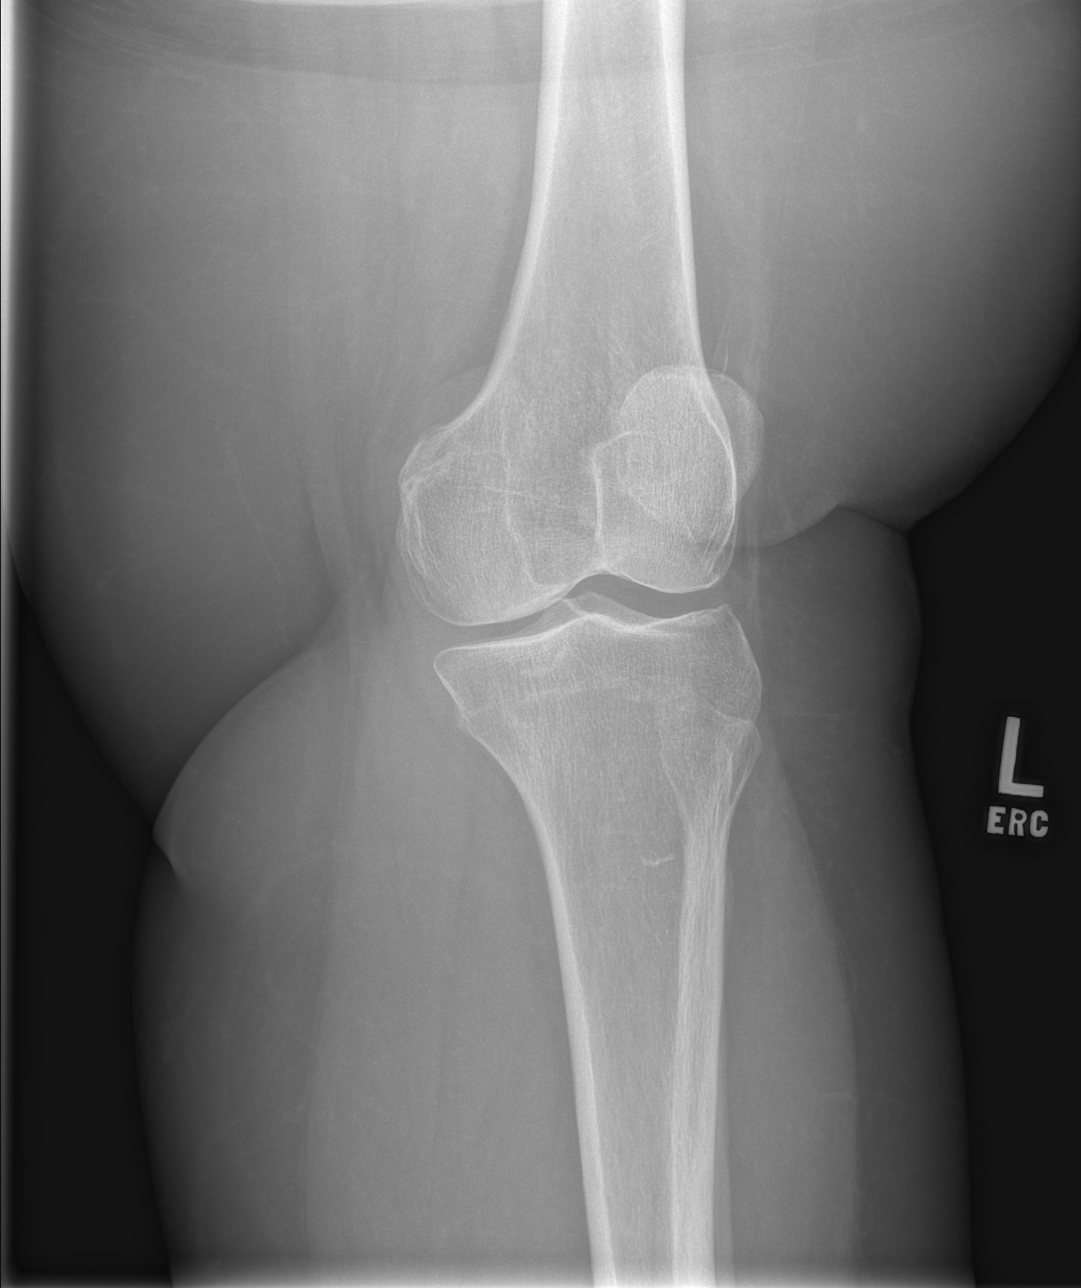
[im 4/4]
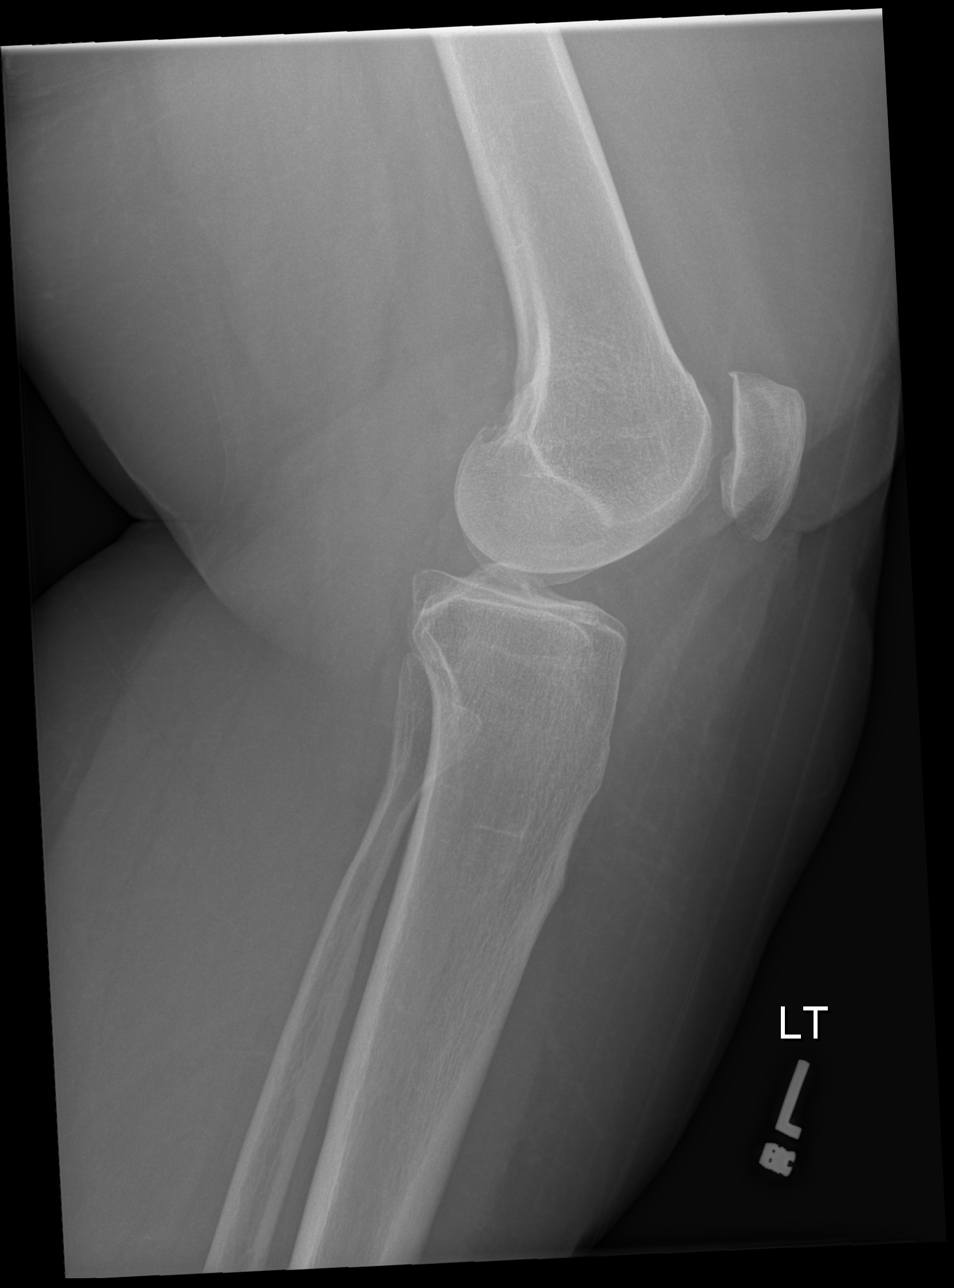

[4 of 4 positions shown; findings below may reference images not displayed]

FINDINGS: There is no evidence of fracture, dislocation, or joint effusion.
There is no evidence of arthropathy or other focal bone abnormality.
Soft tissues are unremarkable.
IMPRESSION: Negative.

## 2015-09-27 IMAGING — CR DG KNEE COMPLETE 4+V*R*
1 series · 4 of 4 positions shown · non-contrast
Comparison: 09/23/2012

CLINICAL DATA: Bilateral knee pain after a fall.

EXAM:
RIGHT KNEE - COMPLETE 4+ VIEW

[Series 1: t knee ap right · 0.14mm/px · 4 of 4 slices shown]
[im 1/4]
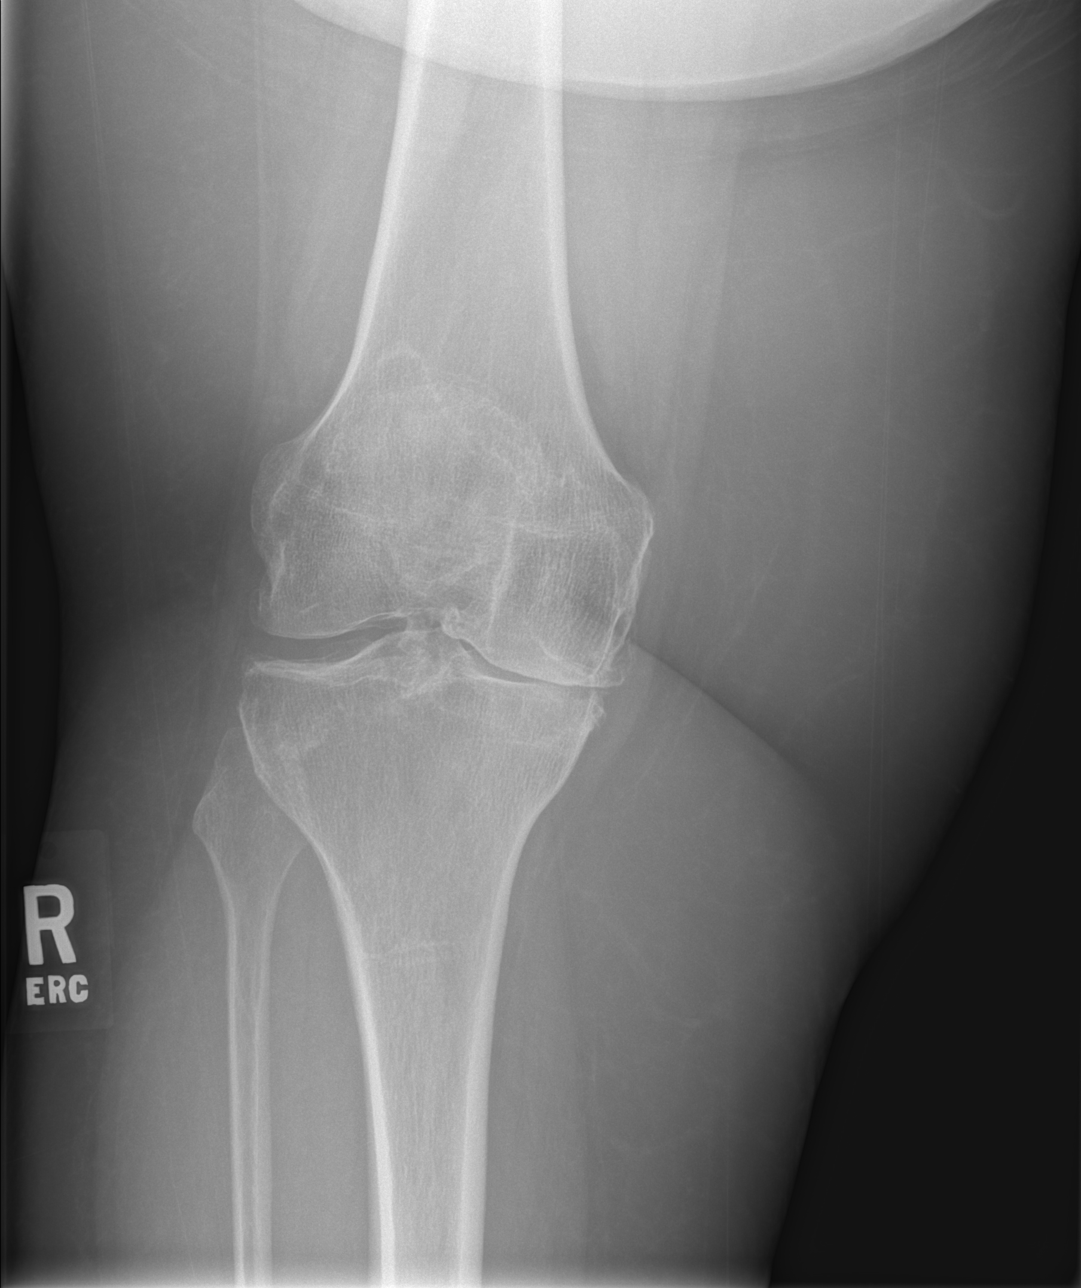
[im 2/4]
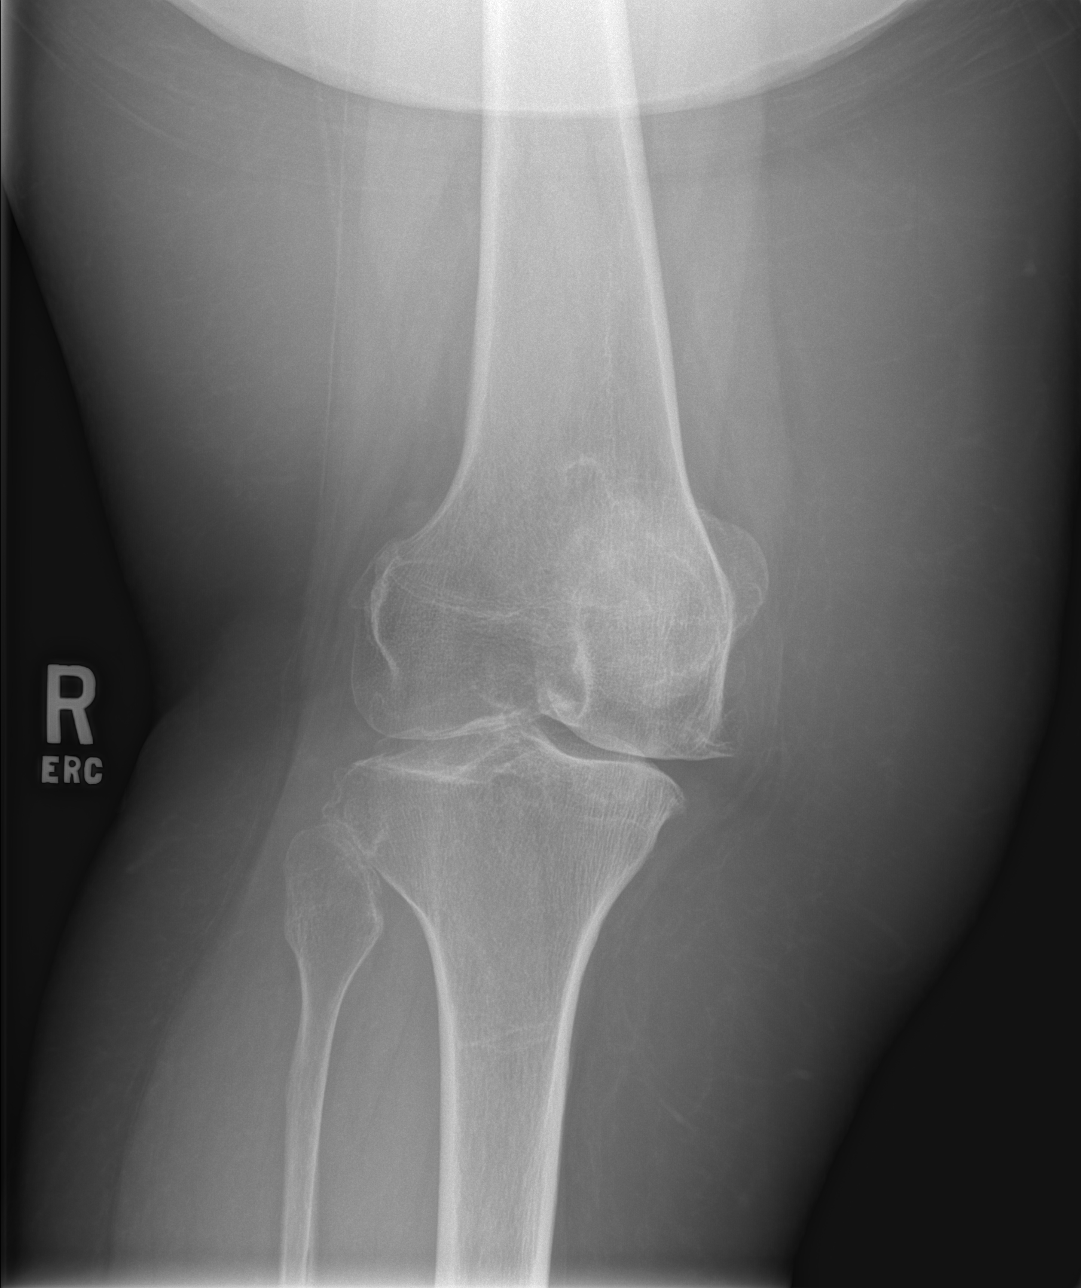
[im 3/4]
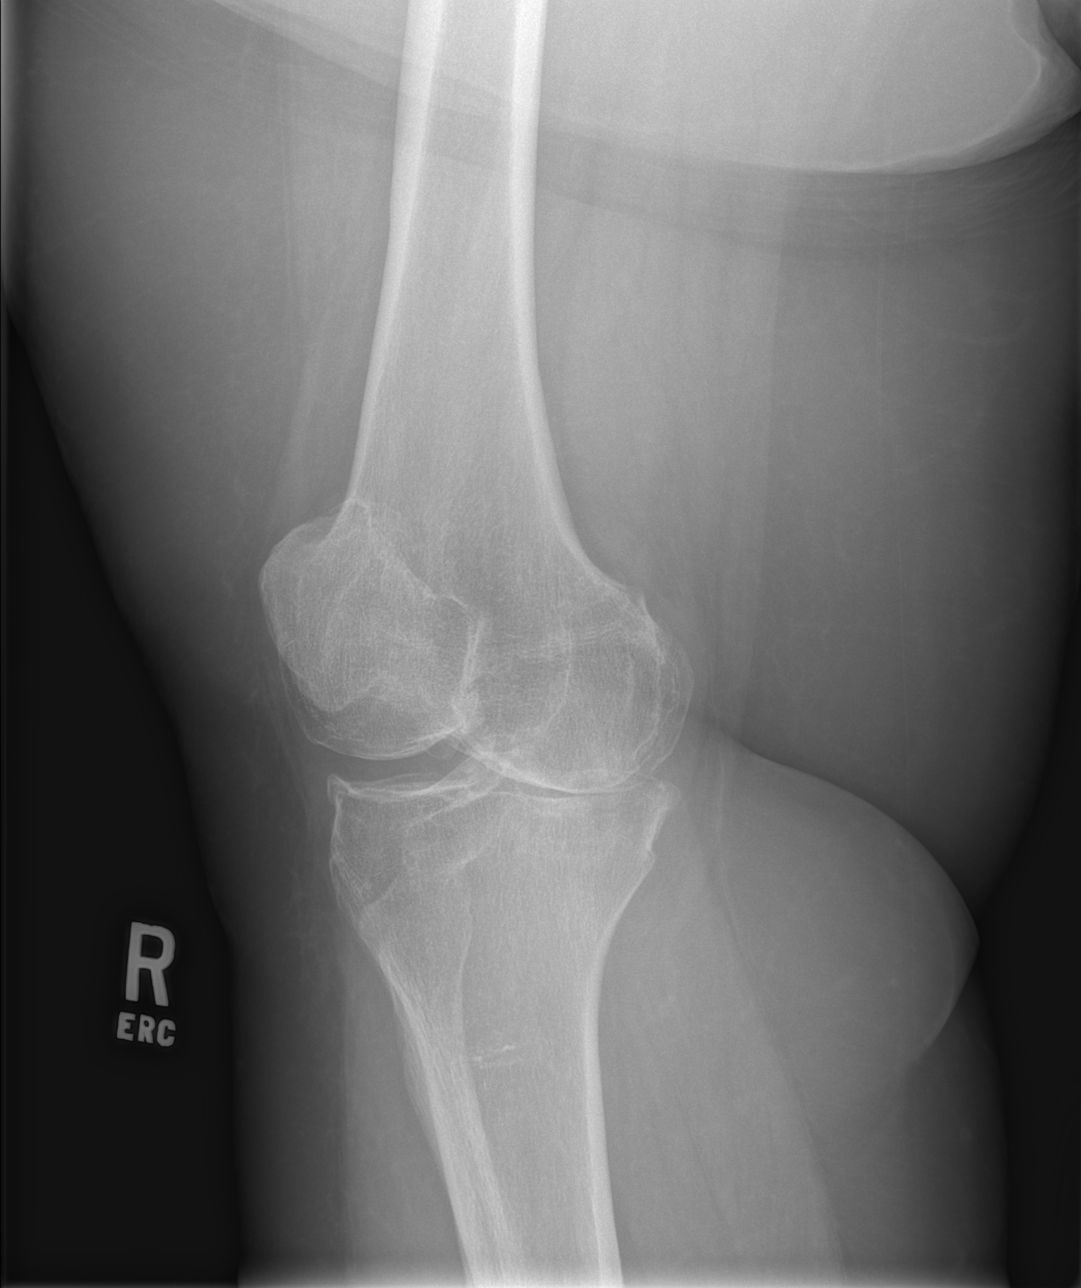
[im 4/4]
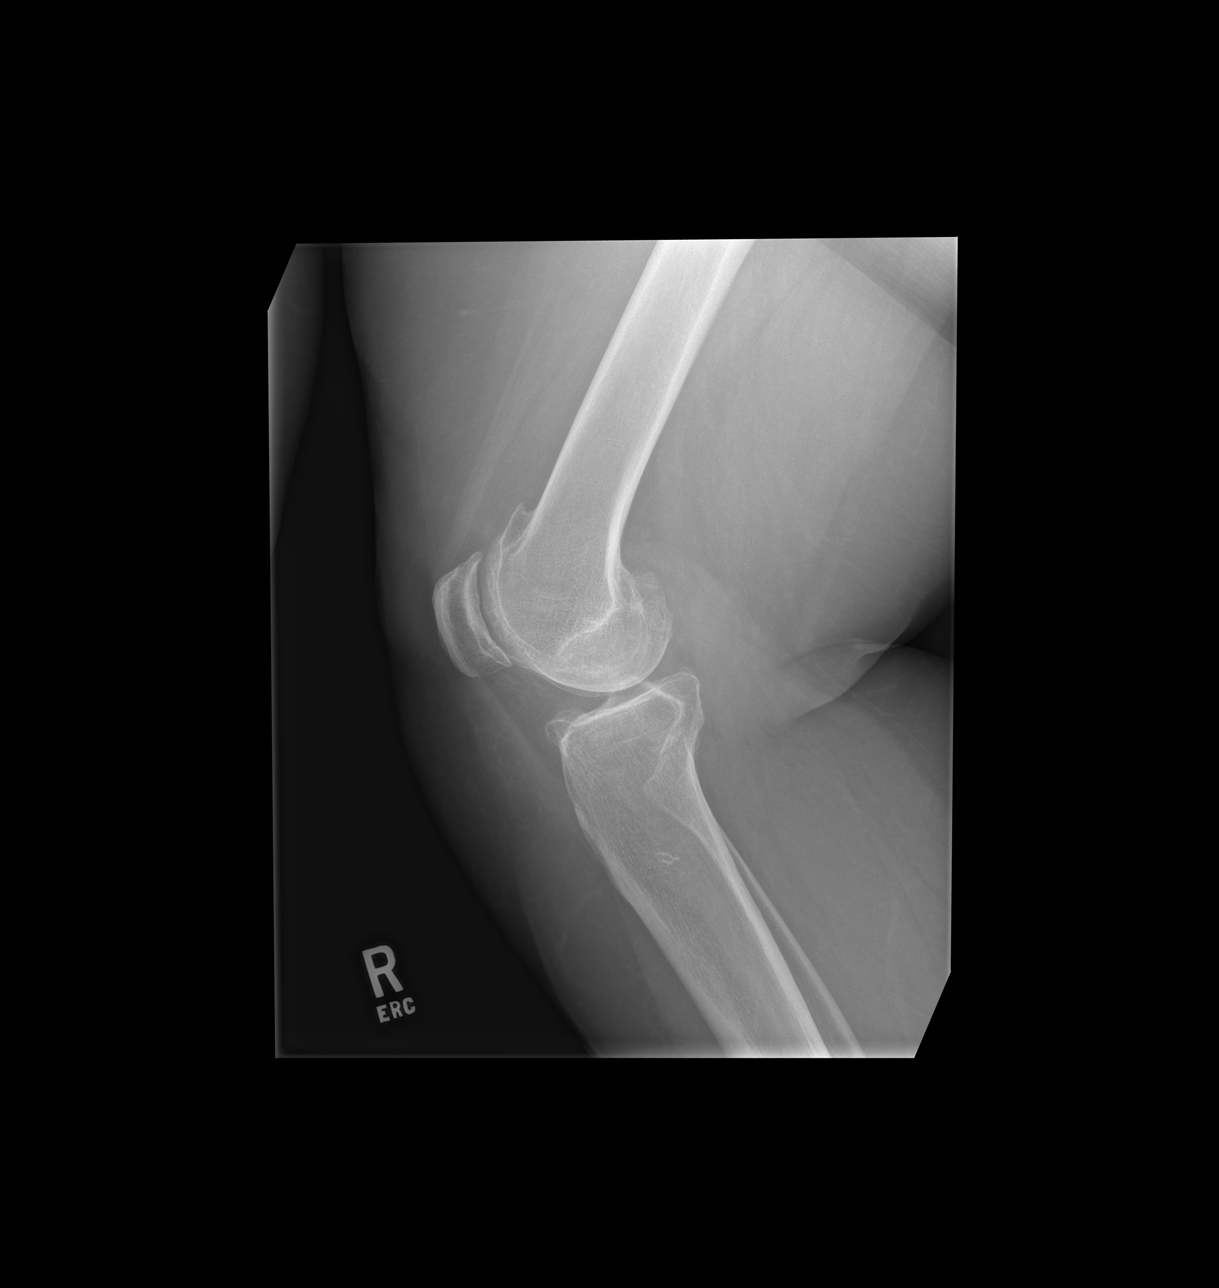

[4 of 4 positions shown; findings below may reference images not displayed]

FINDINGS: Tricompartment degenerative changes with prominent medial
compartment narrowing and osteophytosis. No evidence of acute
fracture or dislocation. No significant effusion. No radiopaque soft
tissue foreign bodies. Degenerative changes are progressing since
the previous study.
IMPRESSION: Progressive degenerative changes in the right knee. No acute
fractures.

## 2015-09-29 IMAGING — MG MM DIGITAL SCREENING BILAT W/ CAD
1 series · 5 of 5 positions shown · non-contrast
Comparison: Previous exam(s).

CLINICAL DATA: Screening.

EXAM:
DIGITAL SCREENING BILATERAL MAMMOGRAM WITH CAD

[R CC · right · 5 of 5 slices shown]
[im 1/5]
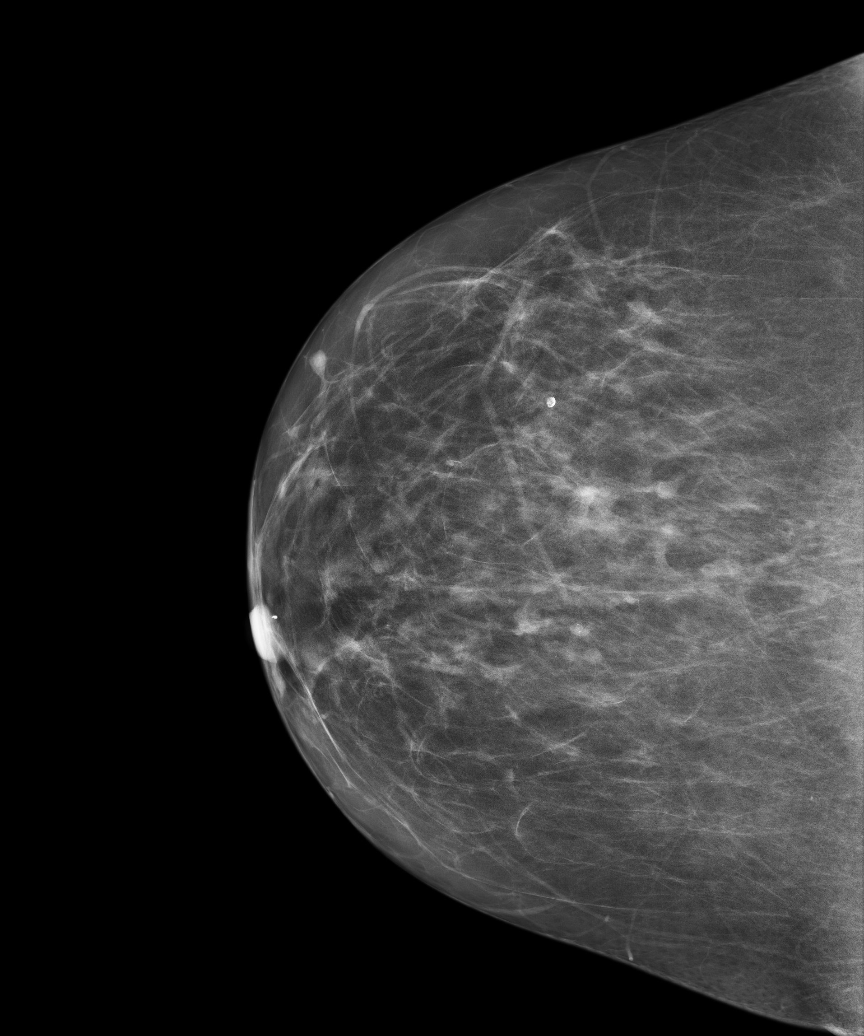
[im 2/5]
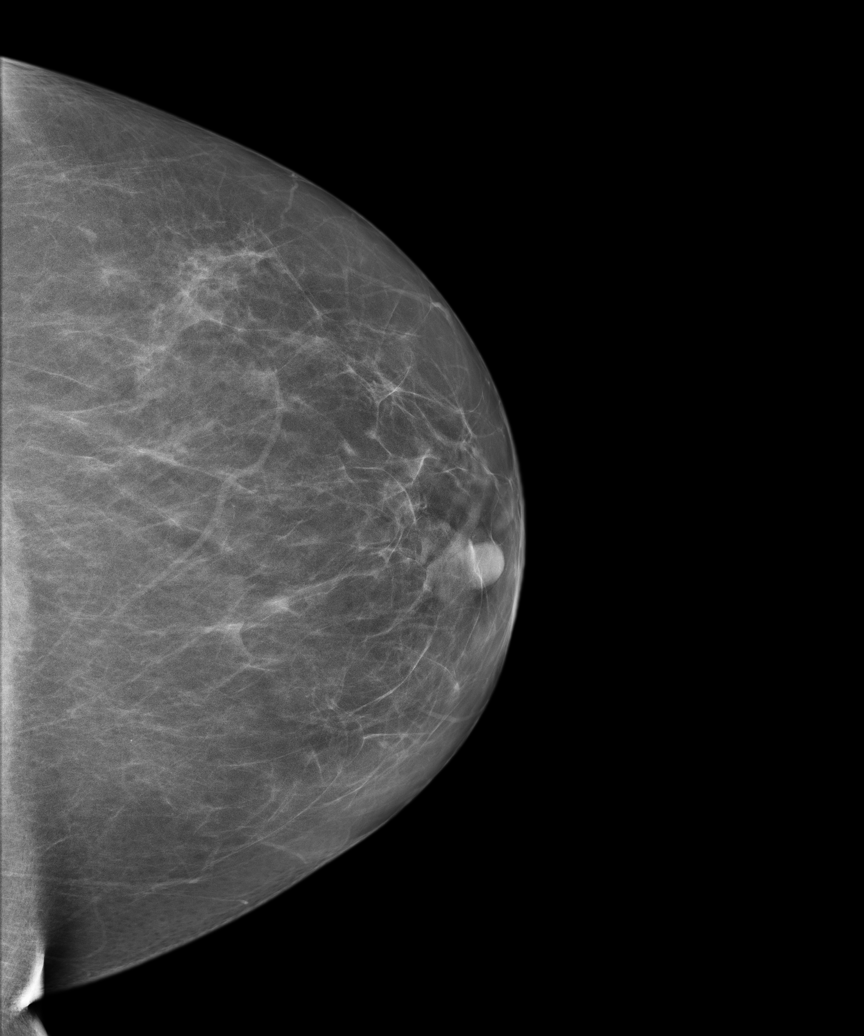
[im 3/5]
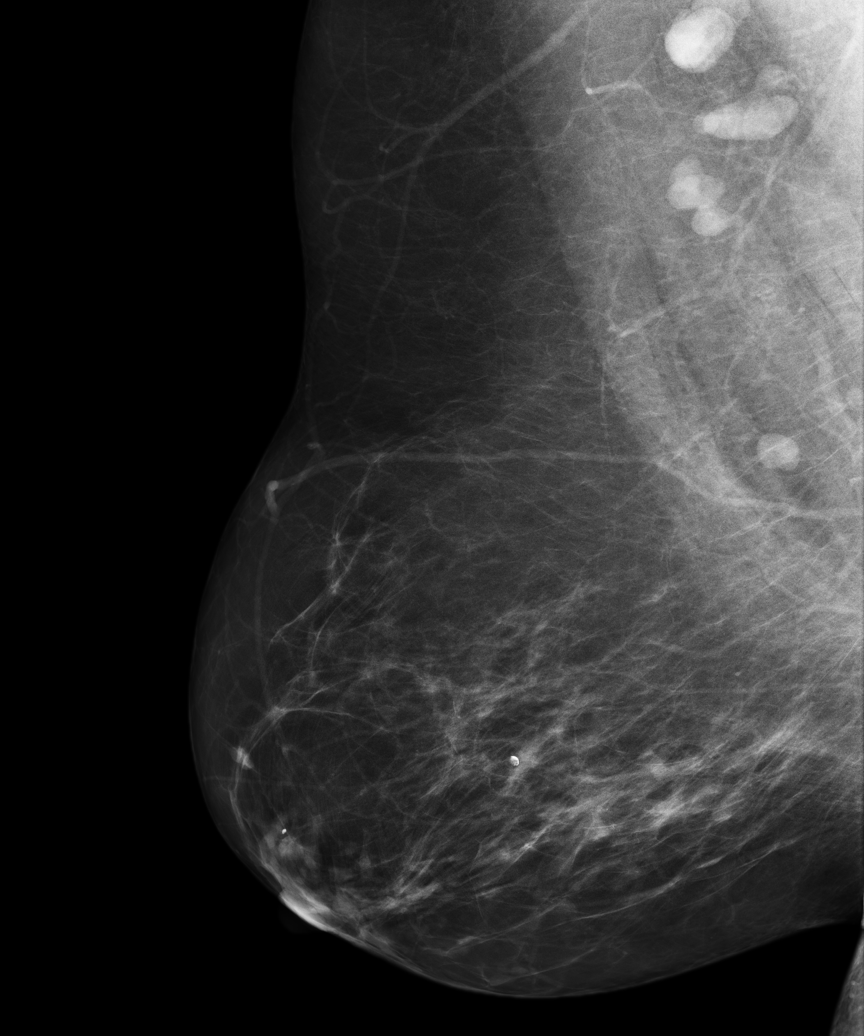
[im 4/5]
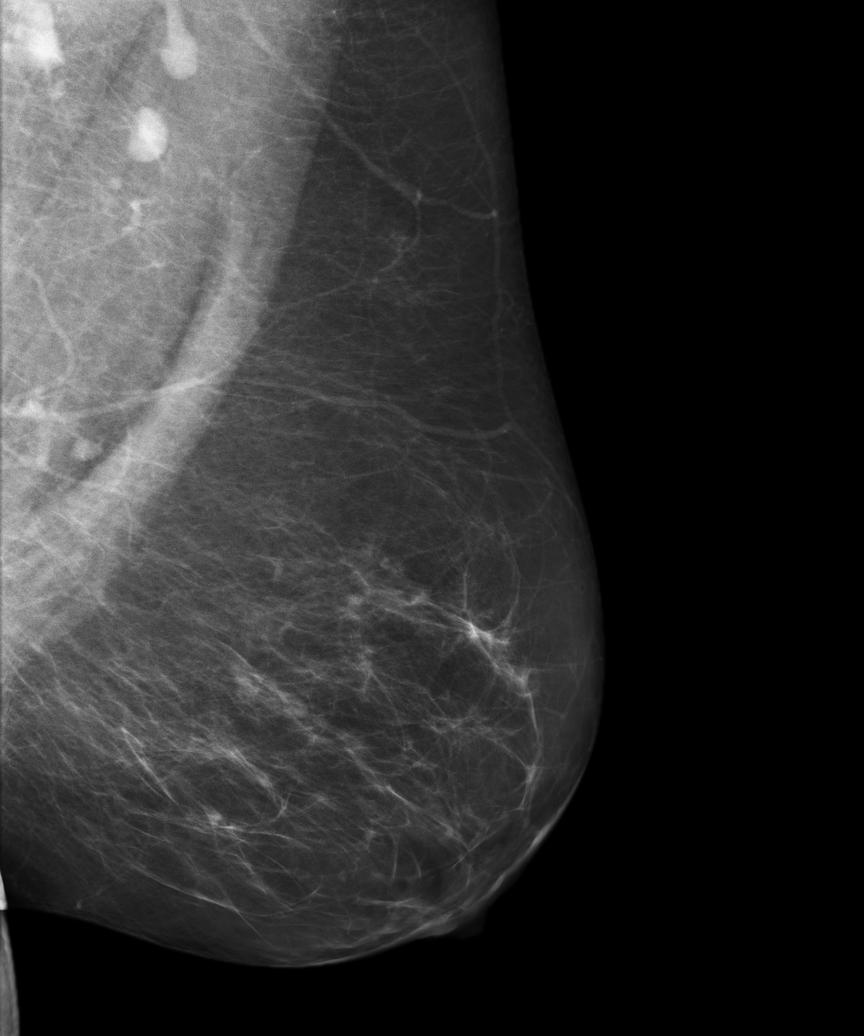
[im 5/5]
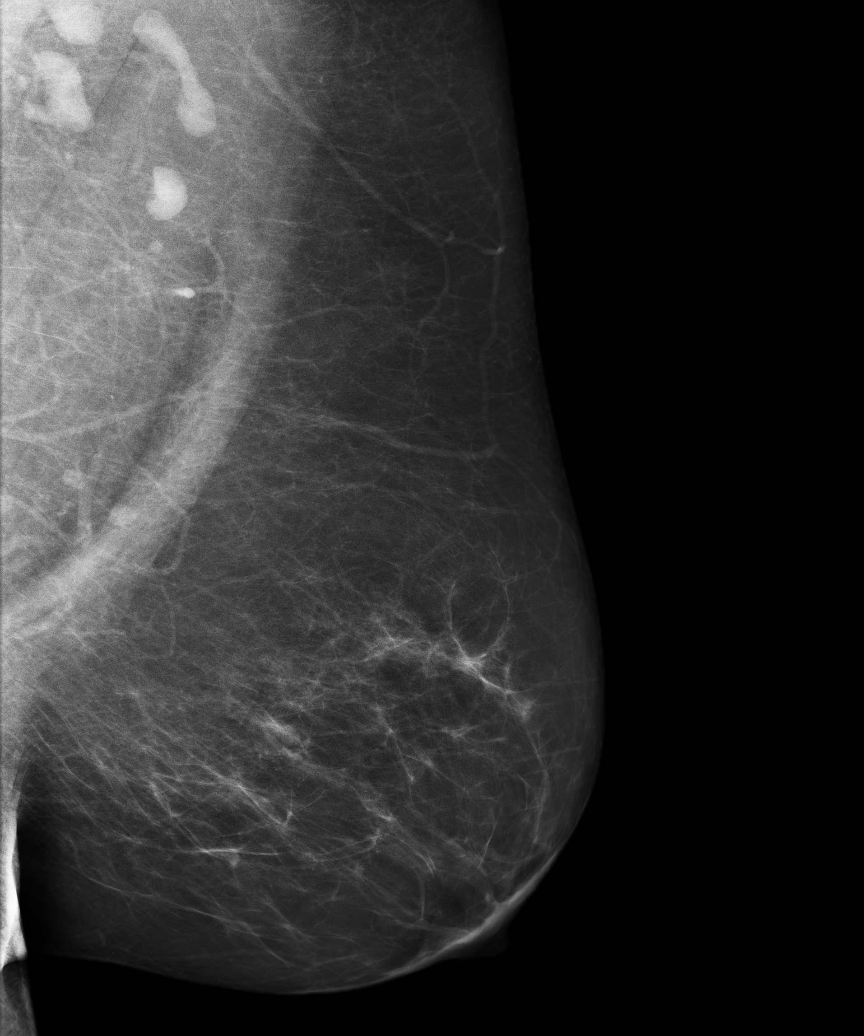

[5 of 5 positions shown; findings below may reference images not displayed]

ACR Breast Density Category b: There are scattered areas of
fibroglandular density.
FINDINGS: There are no findings suspicious for malignancy. Images were
processed with CAD.
IMPRESSION: No mammographic evidence of malignancy. A result letter of this
screening mammogram will be mailed directly to the patient.

RECOMMENDATION:
Screening mammogram in one year. (Code:AS-G-LCT)

BI-RADS CATEGORY  1: Negative.

## 2015-10-25 IMAGING — CR DG KNEE COMPLETE 4+V*R*
1 series · 4 of 4 positions shown · non-contrast
Comparison: 04/13/2014

CLINICAL DATA: Right knee pain.

EXAM:
RIGHT KNEE - COMPLETE 4+ VIEW

[Series 1: oblique · 0.17mm/px · 4 of 4 slices shown]
[im 1/4]
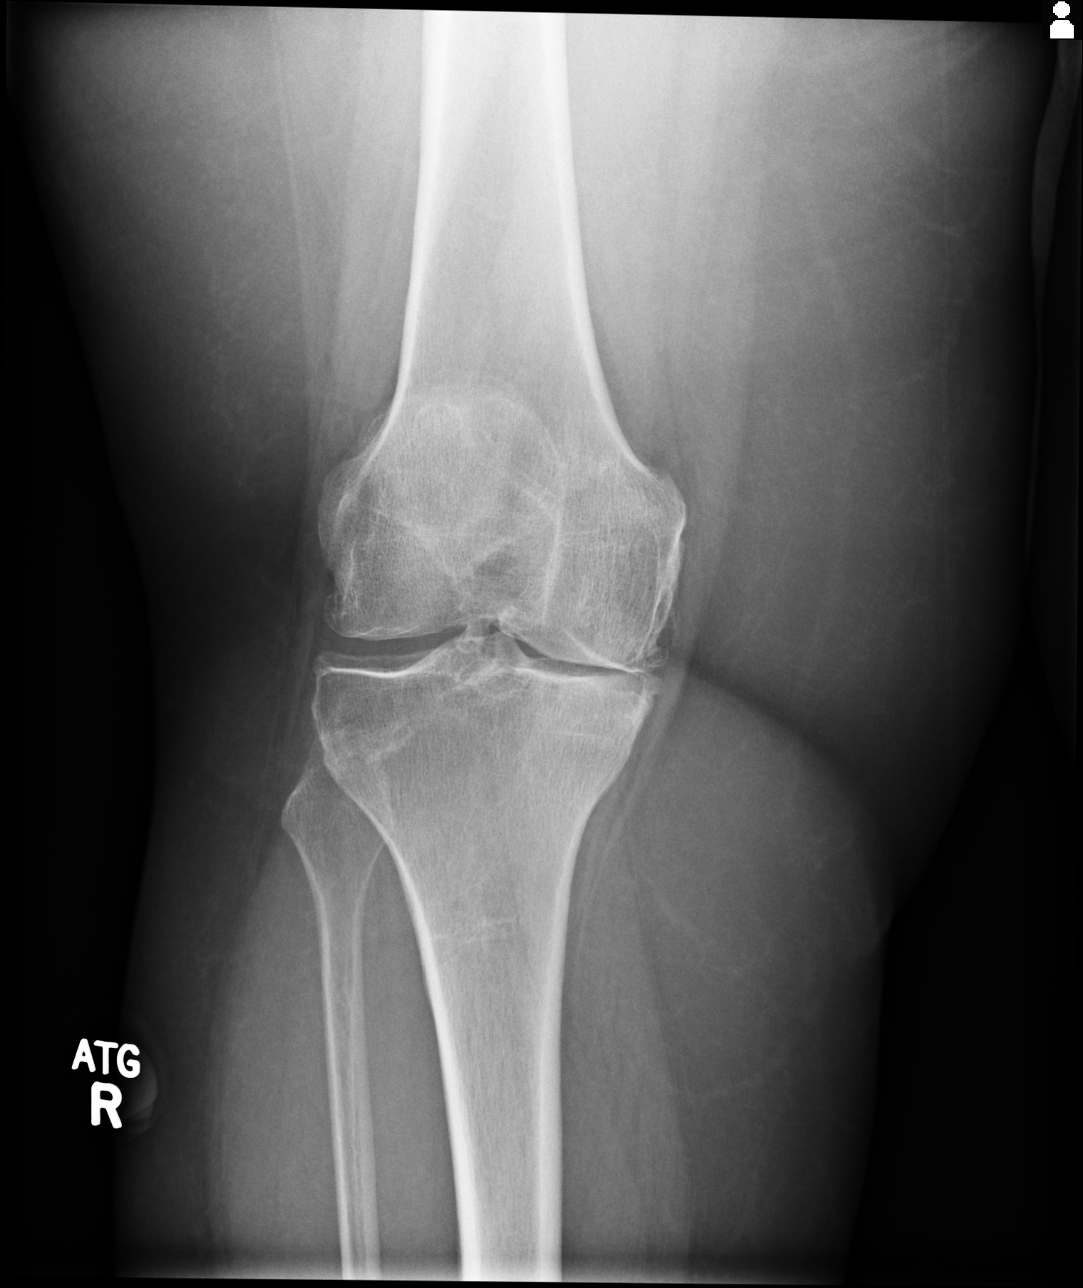
[im 2/4]
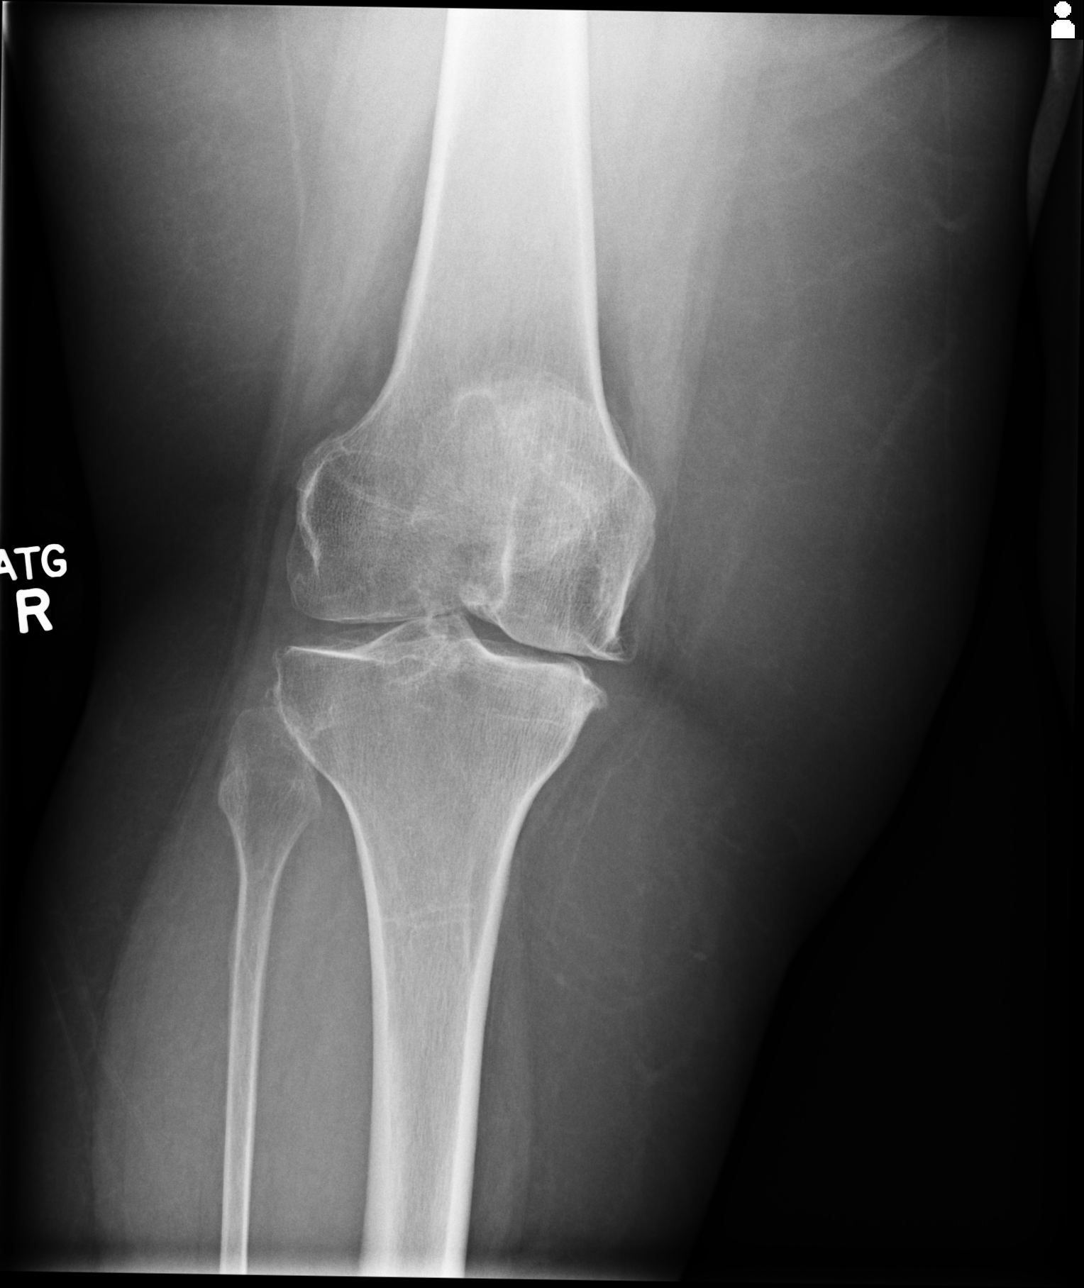
[im 3/4]
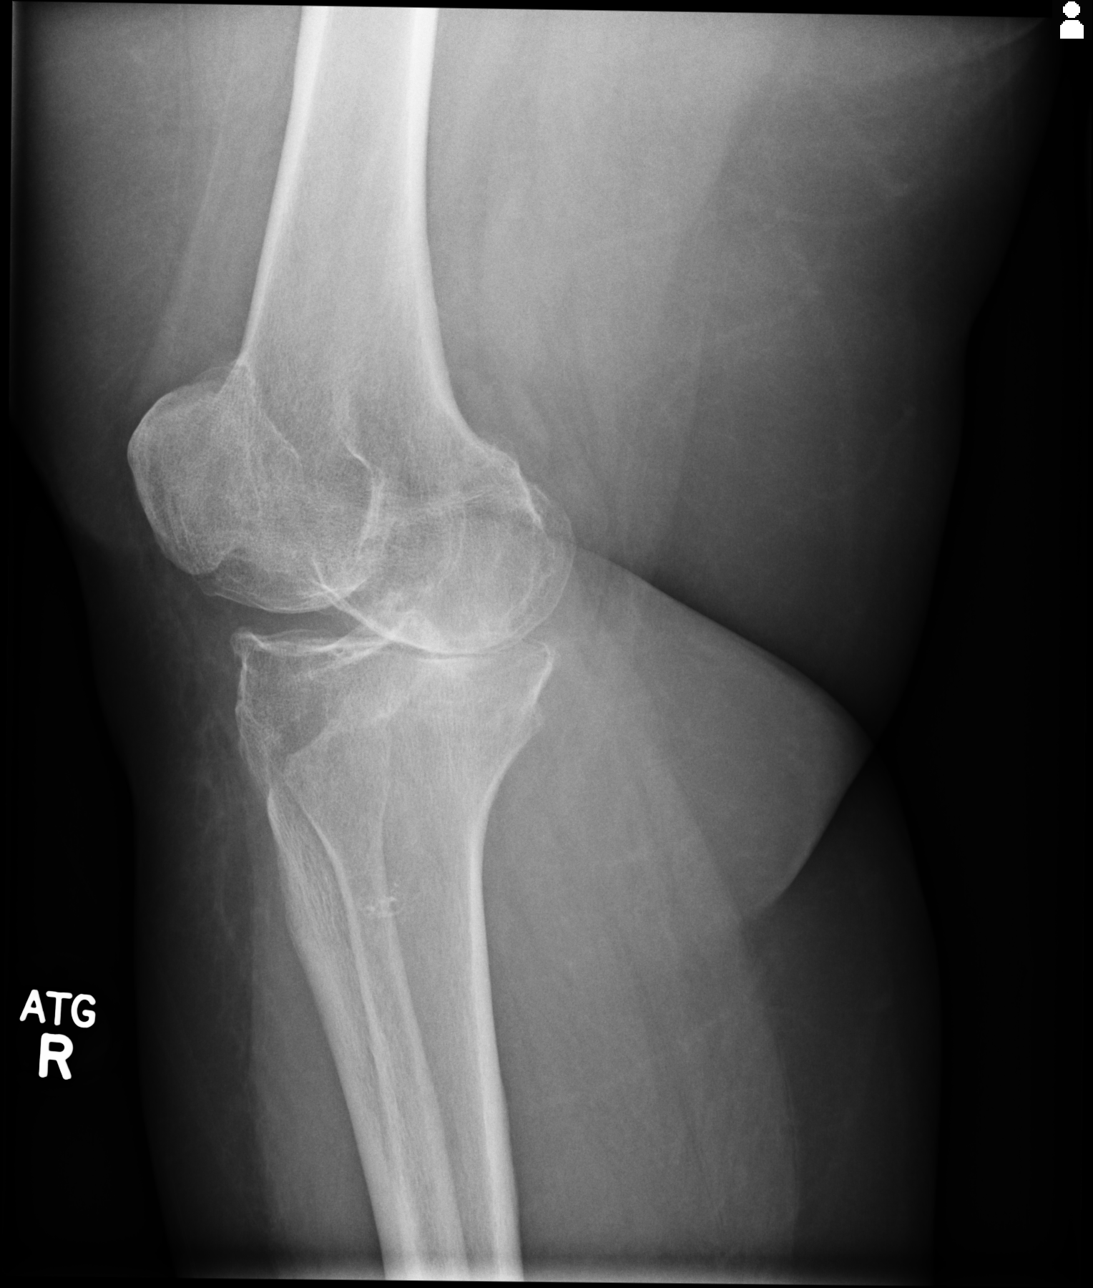
[im 4/4]
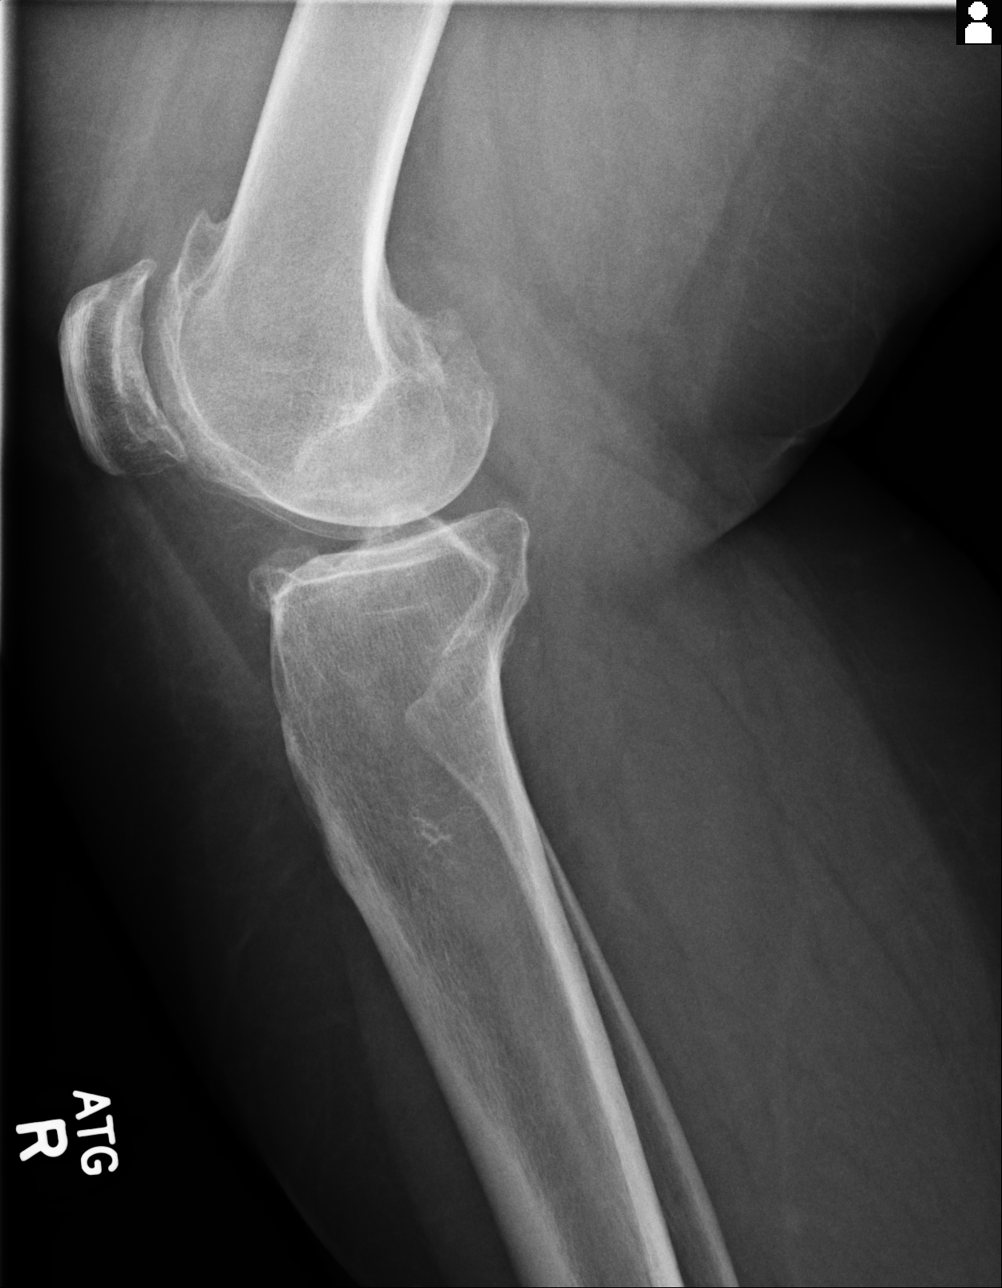

[4 of 4 positions shown; findings below may reference images not displayed]

FINDINGS: There is moderately severe osteoarthritis of the patellofemoral and
medial compartments and to a lesser degree in the lateral
compartment. There has been no change since the prior exam. Tiny
joint effusion.
IMPRESSION: Stable moderately severe osteoarthritis as described.

## 2015-10-25 IMAGING — CR DG KNEE COMPLETE 4+V*L*
1 series · 4 of 4 positions shown · non-contrast
Comparison: 04/13/2014

CLINICAL DATA: Left knee pain.

EXAM:
LEFT KNEE - COMPLETE 4+ VIEW

[Series 1: oblique · 0.17mm/px · 4 of 4 slices shown]
[im 1/4]
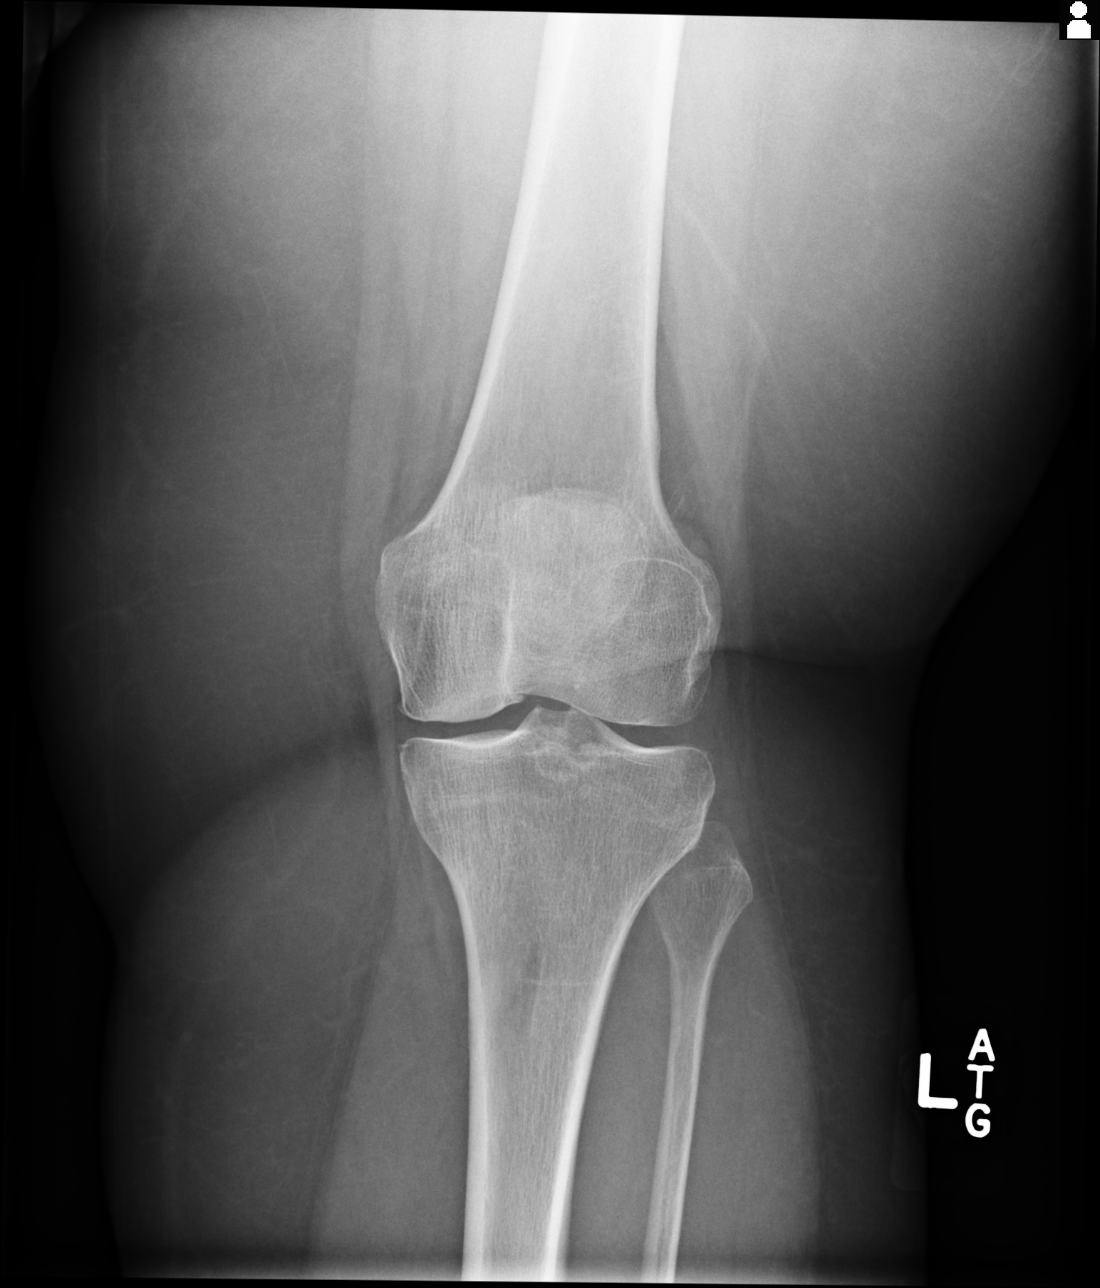
[im 2/4]
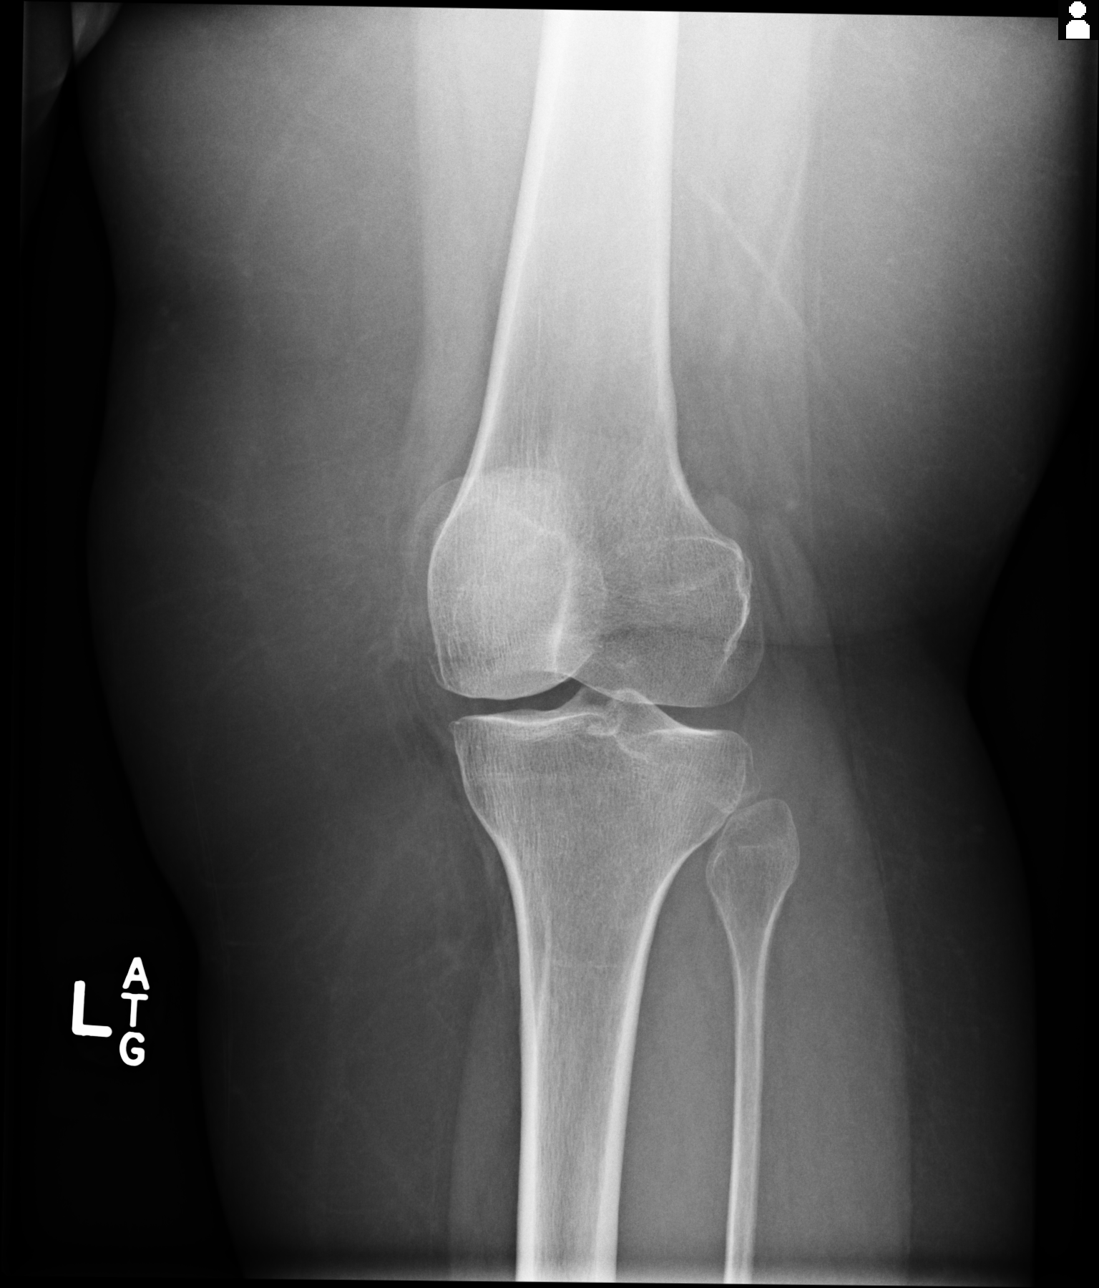
[im 3/4]
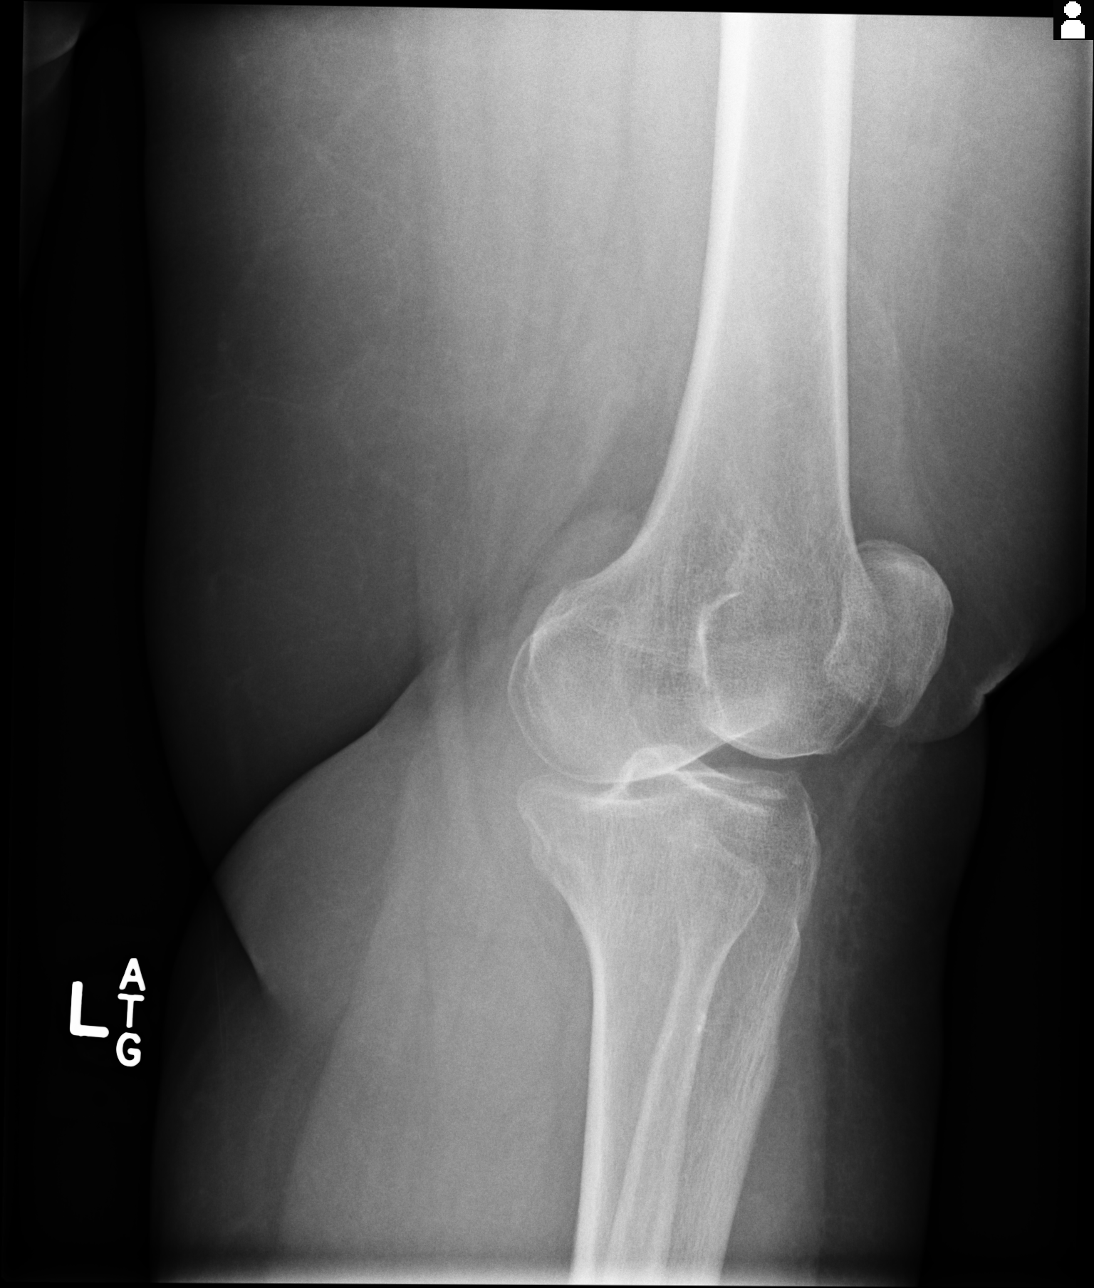
[im 4/4]
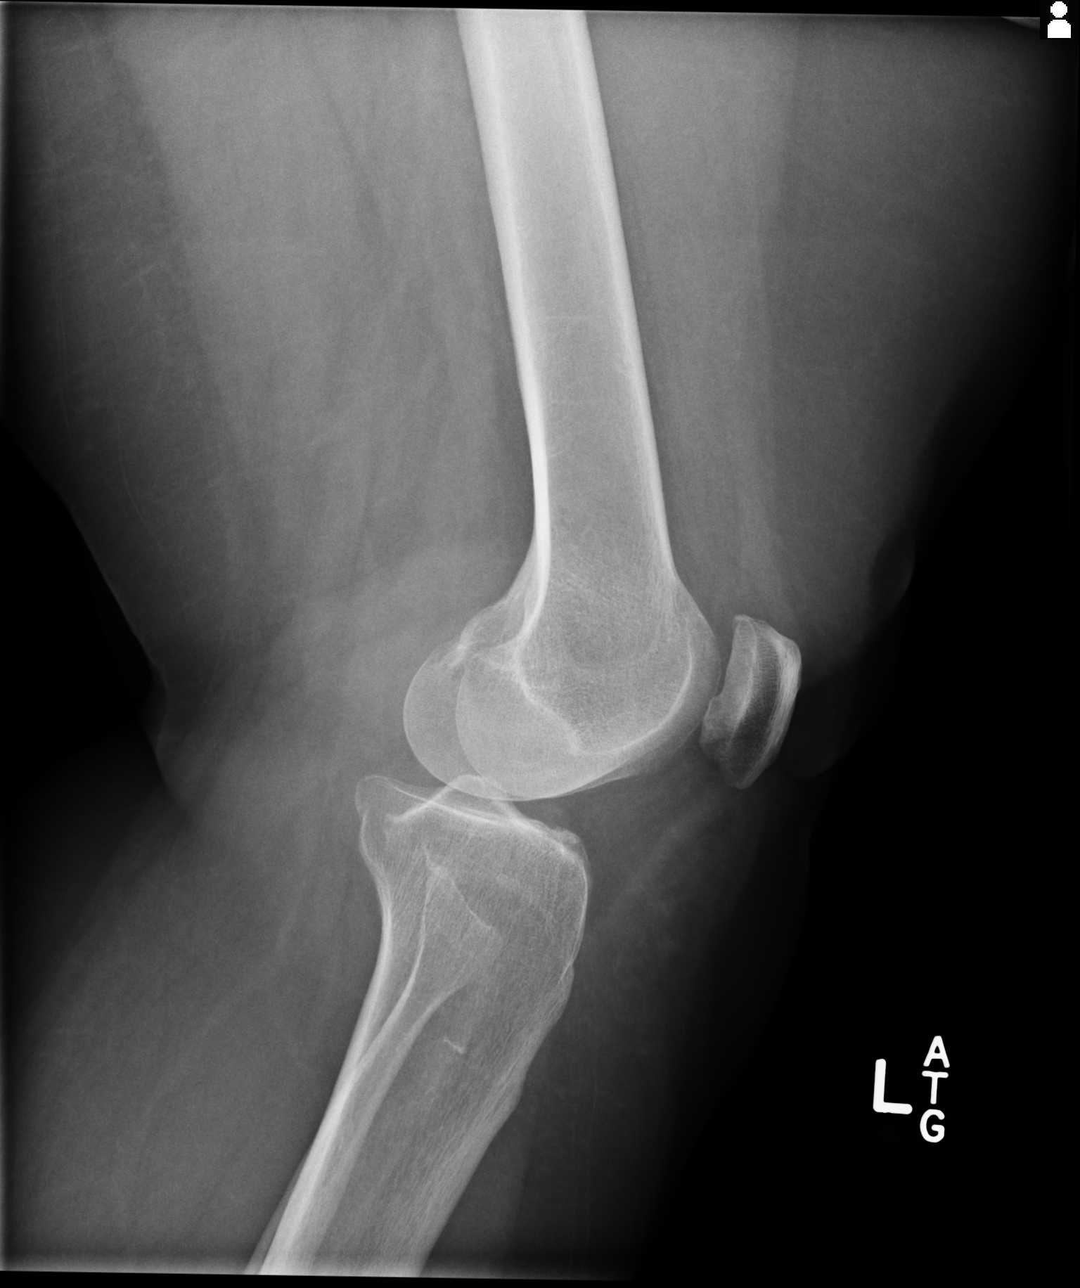

[4 of 4 positions shown; findings below may reference images not displayed]

FINDINGS: There is slight degenerative changes in the medial and
patellofemoral compartment marginal osteophyte formation. Minimal
medial joint space narrowing. No acute osseous abnormalities. No
effusion.
IMPRESSION: Slight arthritic changes, stable.

## 2015-11-20 ENCOUNTER — Emergency Department: Payer: Self-pay

## 2015-11-20 ENCOUNTER — Emergency Department
Admission: EM | Admit: 2015-11-20 | Discharge: 2015-11-20 | Disposition: A | Discharge status: Home / Self Care | Payer: Self-pay | Attending: Emergency Medicine | Admitting: Emergency Medicine

## 2015-11-20 DIAGNOSIS — Y998 Other external cause status: Secondary | ICD-10-CM | POA: Insufficient documentation

## 2015-11-20 DIAGNOSIS — I10 Essential (primary) hypertension: Secondary | ICD-10-CM | POA: Insufficient documentation

## 2015-11-20 DIAGNOSIS — Z791 Long term (current) use of non-steroidal anti-inflammatories (NSAID): Secondary | ICD-10-CM | POA: Insufficient documentation

## 2015-11-20 DIAGNOSIS — F17210 Nicotine dependence, cigarettes, uncomplicated: Secondary | ICD-10-CM | POA: Insufficient documentation

## 2015-11-20 DIAGNOSIS — W19XXXA Unspecified fall, initial encounter: Secondary | ICD-10-CM

## 2015-11-20 DIAGNOSIS — Z7982 Long term (current) use of aspirin: Secondary | ICD-10-CM | POA: Insufficient documentation

## 2015-11-20 DIAGNOSIS — Y9289 Other specified places as the place of occurrence of the external cause: Secondary | ICD-10-CM | POA: Insufficient documentation

## 2015-11-20 DIAGNOSIS — Z79899 Other long term (current) drug therapy: Secondary | ICD-10-CM | POA: Insufficient documentation

## 2015-11-20 DIAGNOSIS — S300XXA Contusion of lower back and pelvis, initial encounter: Secondary | ICD-10-CM | POA: Insufficient documentation

## 2015-11-20 DIAGNOSIS — W1839XA Other fall on same level, initial encounter: Secondary | ICD-10-CM | POA: Insufficient documentation

## 2015-11-20 DIAGNOSIS — Y9389 Activity, other specified: Secondary | ICD-10-CM | POA: Insufficient documentation

## 2015-11-20 DIAGNOSIS — S8000XA Contusion of unspecified knee, initial encounter: Secondary | ICD-10-CM | POA: Insufficient documentation

## 2015-11-20 MED ORDER — CYCLOBENZAPRINE HCL 10 MG PO TABS: 10.0000 mg | ORAL_TABLET | Freq: Three times a day (TID) | ORAL | Status: DC | PRN

## 2015-11-20 MED ORDER — CYCLOBENZAPRINE HCL 10 MG PO TABS
10.0000 mg | ORAL_TABLET | Freq: Once | ORAL | Status: AC
  Administered 2015-11-20: 10 mg via ORAL
  Filled 2015-11-20: qty 1

## 2015-11-20 MED ORDER — IBUPROFEN 800 MG PO TABS: 800.0000 mg | ORAL_TABLET | Freq: Three times a day (TID) | ORAL | Status: DC | PRN

## 2015-11-20 MED ORDER — HYDROCODONE-ACETAMINOPHEN 5-325 MG PO TABS
2.0000 | ORAL_TABLET | Freq: Once | ORAL | Status: AC
  Administered 2015-11-20: 2 via ORAL
  Filled 2015-11-20: qty 2

## 2015-11-20 MED ORDER — HYDROCODONE-ACETAMINOPHEN 5-325 MG PO TABS: 1.0000 | ORAL_TABLET | ORAL | Status: DC | PRN

## 2015-11-20 MED ORDER — IBUPROFEN 800 MG PO TABS
800.0000 mg | ORAL_TABLET | Freq: Once | ORAL | Status: AC
  Administered 2015-11-20: 800 mg via ORAL
  Filled 2015-11-20: qty 1

## 2015-11-20 NOTE — Discharge Instructions (Signed)
Cryotherapy °Cryotherapy means treatment with cold. Ice or gel packs can be used to reduce both pain and swelling. Ice is the most helpful within the first 24 to 48 hours after an injury or flare-up from overusing a muscle or joint. Sprains, strains, spasms, burning pain, shooting pain, and aches can all be eased with ice. Ice can also be used when recovering from surgery. Ice is effective, has very few side effects, and is safe for most people to use. °PRECAUTIONS  °Ice is not a safe treatment option for people with: °· Raynaud phenomenon. This is a condition affecting small blood vessels in the extremities. Exposure to cold may cause your problems to return. °· Cold hypersensitivity. There are many forms of cold hypersensitivity, including: °· Cold urticaria. Red, itchy hives appear on the skin when the tissues begin to warm after being iced. °· Cold erythema. This is a red, itchy rash caused by exposure to cold. °· Cold hemoglobinuria. Red blood cells break down when the tissues begin to warm after being iced. The hemoglobin that carry oxygen are passed into the urine because they cannot combine with blood proteins fast enough. °· Numbness or altered sensitivity in the area being iced. °If you have any of the following conditions, do not use ice until you have discussed cryotherapy with your caregiver: °· Heart conditions, such as arrhythmia, angina, or chronic heart disease. °· High blood pressure. °· Healing wounds or open skin in the area being iced. °· Current infections. °· Rheumatoid arthritis. °· Poor circulation. °· Diabetes. °Ice slows the blood flow in the region it is applied. This is beneficial when trying to stop inflamed tissues from spreading irritating chemicals to surrounding tissues. However, if you expose your skin to cold temperatures for too long or without the proper protection, you can damage your skin or nerves. Watch for signs of skin damage due to cold. °HOME CARE INSTRUCTIONS °Follow  these tips to use ice and cold packs safely. °· Place a dry or damp towel between the ice and skin. A damp towel will cool the skin more quickly, so you may need to shorten the time that the ice is used. °· For a more rapid response, add gentle compression to the ice. °· Ice for no more than 10 to 20 minutes at a time. The bonier the area you are icing, the less time it will take to get the benefits of ice. °· Check your skin after 5 minutes to make sure there are no signs of a poor response to cold or skin damage. °· Rest 20 minutes or more between uses. °· Once your skin is numb, you can end your treatment. You can test numbness by very lightly touching your skin. The touch should be so light that you do not see the skin dimple from the pressure of your fingertip. When using ice, most people will feel these normal sensations in this order: cold, burning, aching, and numbness. °· Do not use ice on someone who cannot communicate their responses to pain, such as small children or people with dementia. °HOW TO MAKE AN ICE PACK °Ice packs are the most common way to use ice therapy. Other methods include ice massage, ice baths, and cryosprays. Muscle creams that cause a cold, tingly feeling do not offer the same benefits that ice offers and should not be used as a substitute unless recommended by your caregiver. °To make an ice pack, do one of the following: °· Place crushed ice or a   bag of frozen vegetables in a sealable plastic bag. Squeeze out the excess air. Place this bag inside another plastic bag. Slide the bag into a pillowcase or place a damp towel between your skin and the bag.  Mix 3 parts water with 1 part rubbing alcohol. Freeze the mixture in a sealable plastic bag. When you remove the mixture from the freezer, it will be slushy. Squeeze out the excess air. Place this bag inside another plastic bag. Slide the bag into a pillowcase or place a damp towel between your skin and the bag. SEEK MEDICAL CARE  IF:  You develop white spots on your skin. This may give the skin a blotchy (mottled) appearance.  Your skin turns blue or pale.  Your skin becomes waxy or hard.  Your swelling gets worse. MAKE SURE YOU:   Understand these instructions.  Will watch your condition.  Will get help right away if you are not doing well or get worse.   This information is not intended to replace advice given to you by your health care provider. Make sure you discuss any questions you have with your health care provider.   Document Released: 06/05/2011 Document Revised: 10/30/2014 Document Reviewed: 06/05/2011 Elsevier Interactive Patient Education 2016 [place] A contusion is a deep bruise. Contusions are the result of a blunt injury to tissues and muscle fibers under the skin. The injury causes bleeding under the skin. The skin overlying the contusion may turn blue, purple, or yellow. Minor injuries will give you a painless contusion, but more severe contusions may stay painful and swollen for a few weeks.  CAUSES  This condition is usually caused by a blow, trauma, or direct force to an area of the body. SYMPTOMS  Symptoms of this condition include:  Swelling of the injured area.  Pain and tenderness in the injured area.  Discoloration. The area may have redness and then turn blue, purple, or yellow. DIAGNOSIS  This condition is diagnosed based on a physical exam and medical history. An X-ray, CT scan, or MRI may be needed to determine if there are any associated injuries, such as broken bones (fractures). TREATMENT  Specific treatment for this condition depends on what area of the body was injured. In general, the best treatment for a contusion is resting, icing, applying pressure to (compression), and elevating the injured area. This is often called the RICE strategy. Over-the-counter anti-inflammatory medicines may also be recommended for pain control.  HOME CARE INSTRUCTIONS     Rest the injured area.  If directed, apply ice to the injured area:  Put ice in a plastic bag.  Place a towel between your skin and the bag.  Leave the ice on for 20 minutes, 2-3 times per day.  If directed, apply light compression to the injured area using an elastic bandage. Make sure the bandage is not wrapped too tightly. Remove and reapply the bandage as directed by your health care provider.  If possible, raise (elevate) the injured area above the level of your heart while you are sitting or lying down.  Take over-the-counter and prescription medicines only as told by your health care provider. SEEK MEDICAL CARE IF:  Your symptoms do not improve after several days of treatment.  Your symptoms get worse.  You have difficulty moving the injured area. SEEK IMMEDIATE MEDICAL CARE IF:   You have severe pain.  You have numbness in a hand or foot.  Your hand or foot turns pale or cold.  This information is not intended to replace advice given to you by your health care provider. Make sure you discuss any questions you have with your health care provider. °  °Document Released: 07/19/2005 Document Revised: 06/30/2015 Document Reviewed: 02/24/2015 °Elsevier Interactive Patient Education ©2016 [employer] ° °

## 2015-11-20 NOTE — ED Provider Notes (Signed)
[facility] Emergency Department Provider Note  ____________________________________________  Time seen: Approximately 5:14 PM  I have reviewed the triage vital signs and the nursing notes.   HISTORY  Chief Complaint Fall    HPI Carla [name] is a 55 y.o. female who presents for evaluation of low back pain and bilateral knee pain. Patient states that she fell earlier today complaints of severe pain. She reports that she is able to walk but that increase pain. Has a lot of point tenderness but no bruising noted. So the pain is worsened with movement and relieved with nothing.   Past Medical History  Diagnosis Date  . Hypertension   . Hyperlipidemia   . GERD (gastroesophageal reflux disease)   . Bell's palsy   . Morbid obesity with BMI of 40.0-44.9, adult [facility])     Patient Active Problem List   Diagnosis Date Noted  . Chest pain 08/06/2012    Past Surgical History  Procedure Laterality Date  . Cholecystectomy    . Vaginal hysterectomy      Current Outpatient Rx  Name  Route  Sig  Dispense  Refill  . aspirin EC 81 MG tablet   Oral   Take 81 mg by mouth daily.         [name] atorvastatin (LIPITOR) 40 MG tablet   Oral   Take 40 mg by mouth at bedtime.          . citalopram (CELEXA) 20 MG tablet   Oral   Take 20 mg by mouth daily.         . cyclobenzaprine (FLEXERIL) 10 MG tablet   Oral   Take 1 tablet (10 mg total) by mouth every 8 (eight) hours as needed for muscle spasms.   30 tablet   1   . HYDROcodone-acetaminophen (NORCO) 5-325 MG tablet   Oral   Take 1-2 tablets by mouth every 4 (four) hours as needed for moderate pain.   15 tablet   0   . ibuprofen (ADVIL,MOTRIN) 800 MG tablet   Oral   Take 1 tablet (800 mg total) by mouth every 8 (eight) hours as needed.   30 tablet   0   . meloxicam (MOBIC) 15 MG tablet   Oral   Take 1 tablet (15 mg total) by mouth daily.   30 tablet   0   . metoprolol tartrate (LOPRESSOR) 25 MG  tablet   Oral   Take 25 mg by mouth 2 (two) times daily.         . ondansetron (ZOFRAN ODT) 4 MG disintegrating tablet   Oral   Take 1 tablet (4 mg total) by mouth every 6 (six) hours as needed for nausea or vomiting.   20 tablet   0     Allergies Review of patient's allergies indicates no known allergies.  Family History  Problem Relation Age of Onset  . Hypertension Mother   . Heart disease Mother   . Heart attack Mother   . Hypertension Father     Social History Social History  Substance Use Topics  . Smoking status: Current Some Day Smoker -- 0.50 packs/day    Types: Cigarettes  . Smokeless tobacco: Not on file  . Alcohol Use: No    Review of Systems Constitutional: No fever/chills Eyes: No visual changes. ENT: No sore throat. Cardiovascular: Denies chest pain. Respiratory: Denies shortness of breath. Gastrointestinal: No abdominal pain.  No nausea, no vomiting.  No diarrhea.  No constipation.  Genitourinary: Negative for dysuria. Musculoskeletal: Positive for low back pain and bilateral knee pain. Skin: Negative for rash. Neurological: Negative for headaches, focal weakness or numbness.  10-point ROS otherwise negative.  ____________________________________________   PHYSICAL EXAM:  VITAL SIGNS: ED Triage Vitals  Enc Vitals Group     BP 11/20/15 1641 174/103 mmHg     Pulse Rate 11/20/15 1641 104     Resp 11/20/15 1641 18     Temp 11/20/15 1641 98 F (36.7 C)     Temp Source 11/20/15 1641 Oral     SpO2 11/20/15 1641 96 %     Weight 11/20/15 1641 225 lb (102.059 kg)     Height 11/20/15 1641 5\' 3"  (1.6 m)     Head Cir --      Peak Flow --      Pain Score 11/20/15 1642 9     Pain Loc --      Pain Edu? --      Excl. in [place]? --     Constitutional: Alert and oriented. Well appearing and in no acute distress. Head: Atraumatic. Nose: No congestion/rhinnorhea. Mouth/Throat: Mucous membranes are moist.  Oropharynx non-erythematous. Neck: No  stridor.   Cardiovascular: Normal rate, regular rhythm. Grossly normal heart sounds.  Good peripheral circulation. Respiratory: Normal respiratory effort.  No retractions. Lungs CTAB. Gastrointestinal: Soft and nontender. No distention. No abdominal bruits. No CVA tenderness. Musculoskeletal: Bilateral tenderness noted to both knees. No ecchymosis or bruising. No edema or effusions. Positive tenderness to lumbar spine. Neurologic:  Normal speech and language. No gross focal neurologic deficits are appreciated. No gait instability. Skin:  Skin is warm, dry and intact. No rash noted. Psychiatric: Mood and affect are normal. Speech and behavior are normal.  ____________________________________________   LABS (all labs ordered are listed, but only abnormal results are displayed)  Labs Reviewed - No data to display   RADIOLOGY  No acute clinical or osseous findings noted on bilateral knees and lumbar spine. Negative for effusion and dislocation. ____________________________________________   PROCEDURES  Procedure(s) performed: None  Critical Care performed: No  ____________________________________________   INITIAL IMPRESSION / ASSESSMENT AND PLAN / ED COURSE  Pertinent labs & imaging results that were available during my care of the patient were reviewed by me and considered in my medical decision making (see chart for details).  Status post fall with acute lumbar contusion and bilateral knee contusion. Reassurance provided to the patient Rx given for Motrin 800 mg 3 times a day and Flexeril 10 mg 3 times a day. Patient follow-up with PCP or return to the ER with any worsening symptomology. She voices no other emergency medical complaints at this time. ____________________________________________   FINAL CLINICAL IMPRESSION(S) / ED DIAGNOSES  Final diagnoses:  Fall, initial encounter  Contusion of lower back, initial encounter  Knee contusion, unspecified laterality, initial  encounter      Arlyss Repress, PA-C 11/20/15 1809  Lavonia Drafts, MD 11/20/15 2218

## 2015-11-20 NOTE — ED Notes (Signed)
Pt reports she was helping her daughter move a refrigerator and fell out the door. Pain in knees and lower back. Denies hitting head

## 2015-12-06 ENCOUNTER — Emergency Department
Admission: EM | Admit: 2015-12-06 | Discharge: 2015-12-06 | Disposition: A | Discharge status: Home / Self Care | Payer: Self-pay | Attending: Emergency Medicine | Admitting: Emergency Medicine

## 2015-12-06 DIAGNOSIS — Z7982 Long term (current) use of aspirin: Secondary | ICD-10-CM | POA: Insufficient documentation

## 2015-12-06 DIAGNOSIS — F419 Anxiety disorder, unspecified: Secondary | ICD-10-CM | POA: Insufficient documentation

## 2015-12-06 DIAGNOSIS — Z79899 Other long term (current) drug therapy: Secondary | ICD-10-CM | POA: Insufficient documentation

## 2015-12-06 DIAGNOSIS — I10 Essential (primary) hypertension: Secondary | ICD-10-CM | POA: Insufficient documentation

## 2015-12-06 DIAGNOSIS — F17210 Nicotine dependence, cigarettes, uncomplicated: Secondary | ICD-10-CM | POA: Insufficient documentation

## 2015-12-06 DIAGNOSIS — R21 Rash and other nonspecific skin eruption: Secondary | ICD-10-CM | POA: Insufficient documentation

## 2015-12-06 DIAGNOSIS — Z791 Long term (current) use of non-steroidal anti-inflammatories (NSAID): Secondary | ICD-10-CM | POA: Insufficient documentation

## 2015-12-06 MED ORDER — CEPHALEXIN 500 MG PO CAPS: 500.0000 mg | ORAL_CAPSULE | Freq: Three times a day (TID) | ORAL | Status: DC

## 2015-12-06 MED ORDER — MUPIROCIN 2 % EX OINT: TOPICAL_OINTMENT | CUTANEOUS | Status: DC

## 2015-12-06 NOTE — ED Provider Notes (Signed)
[facility] Emergency Department Provider Note  ____________________________________________  Time seen: Approximately 10:56 AM  I have reviewed the triage vital signs and the nursing notes.   HISTORY  Chief Complaint No chief complaint on file.   HPI Carla [name] is a 55 y.o. female is here with complaint of a sore to the corner of her mouth for about one week. Patient states that it is not painful and at times is swollen and then gets better. She has been putting over-the-counter cream on it without any results. She denies any fever or chills. Area does not appear to be spreading but she is anxious to know whether this is contagious since she has a grandchild.She rated her pain on a scale to the nurse as an 8/10 however when I asked she denies pain.   Past Medical History  Diagnosis Date  . Hypertension   . Hyperlipidemia   . GERD (gastroesophageal reflux disease)   . Bell's palsy   . Morbid obesity with BMI of 40.0-44.9, adult [facility])     Patient Active Problem List   Diagnosis Date Noted  . Chest pain 08/06/2012    Past Surgical History  Procedure Laterality Date  . Cholecystectomy    . Vaginal hysterectomy      Current Outpatient Rx  Name  Route  Sig  Dispense  Refill  . aspirin EC 81 MG tablet   Oral   Take 81 mg by mouth daily.         [name] atorvastatin (LIPITOR) 40 MG tablet   Oral   Take 40 mg by mouth at bedtime.          . cephALEXin (KEFLEX) 500 MG capsule   Oral   Take 1 capsule (500 mg total) by mouth 3 (three) times daily.   21 capsule   0   . citalopram (CELEXA) 20 MG tablet   Oral   Take 20 mg by mouth daily.         [name] ibuprofen (ADVIL,MOTRIN) 800 MG tablet   Oral   Take 1 tablet (800 mg total) by mouth every 8 (eight) hours as needed.   30 tablet   0   . meloxicam (MOBIC) 15 MG tablet   Oral   Take 1 tablet (15 mg total) by mouth daily.   30 tablet   0   . metoprolol tartrate (LOPRESSOR) 25 MG tablet  Oral   Take 25 mg by mouth 2 (two) times daily.         . mupirocin ointment (BACTROBAN) 2 %      Apply to affected area 3 times daily   22 g   0   . ondansetron (ZOFRAN ODT) 4 MG disintegrating tablet   Oral   Take 1 tablet (4 mg total) by mouth every 6 (six) hours as needed for nausea or vomiting.   20 tablet   0     Allergies Review of patient's allergies indicates no known allergies.  Family History  Problem Relation Age of Onset  . Hypertension Mother   . Heart disease Mother   . Heart attack Mother   . Hypertension Father     Social History Social History  Substance Use Topics  . Smoking status: Current Some Day Smoker -- 0.50 packs/day    Types: Cigarettes  . Smokeless tobacco: Not on file  . Alcohol Use: No    Review of Systems Constitutional: No fever/chills ENT: Positive sore external right side mouth Cardiovascular:  Denies chest pain. Respiratory: Denies shortness of breath. Skin: Positive for rash right mouth. Neurological: Negative for headaches, focal weakness or numbness.  10-point ROS otherwise negative.  ____________________________________________   PHYSICAL EXAM:  VITAL SIGNS: ED Triage Vitals  Enc Vitals Group     BP 12/06/15 1013 159/94 mmHg     Pulse Rate 12/06/15 1013 85     Resp 12/06/15 1013 18     Temp 12/06/15 1013 97.9 F (36.6 C)     Temp Source 12/06/15 1013 Oral     SpO2 12/06/15 1013 95 %     Weight 12/06/15 1013 225 lb (102.059 kg)     Height 12/06/15 1013 5\' 3"  (1.6 m)     Head Cir --      Peak Flow --      Pain Score 12/06/15 1014 8     Pain Loc --      Pain Edu? --      Excl. in [place]? --     Constitutional: Alert and oriented. Well appearing and in no acute distress. Eyes: Conjunctivae are normal. PERRL. EOMI. Head: Atraumatic. Nose: No congestion/rhinnorhea. Mouth/Throat: Mucous membranes are moist.  Oropharynx non-erythematous. Neck: No stridor.   Hematological/Lymphatic/Immunilogical: No cervical  lymphadenopathy. Cardiovascular: Normal rate, regular rhythm. Grossly normal heart sounds.  Good peripheral circulation. Respiratory: Normal respiratory effort.  No retractions. Lungs CTAB. Gastrointestinal: Soft and nontender. No distention.  Musculoskeletal: Moves upper and lower extremities without any difficulty. Normal gait was noted. Neurologic:  Normal speech and language. No gross focal neurologic deficits are appreciated.  Skin:  Right corner of the mouth there is a red macular lesion 2. There is no drainage, warmth or tenderness. Portion of this area looks like an early superficial ulcer. There is no other lesions noted on patient's face. Psychiatric: Mood and affect are normal. Speech and behavior are normal.  ____________________________________________   LABS (all labs ordered are listed, but only abnormal results are displayed)  Labs Reviewed - No data to display   PROCEDURES  Procedure(s) performed: None  Critical Care performed: No  ____________________________________________   INITIAL IMPRESSION / ASSESSMENT AND PLAN / ED COURSE  Pertinent labs & imaging results that were available during my care of the patient were reviewed by me and considered in my medical decision making (see chart for details).  Patient was given a prescription for Keflex along with Bactroban ointment. She is follow-up with her primary care doctor or [place] if any continued problems. ____________________________________________   FINAL CLINICAL IMPRESSION(S) / ED DIAGNOSES  Final diagnoses:  Skin eruption      Johnn Hai, PA-C 12/06/15 1528  Lavonia Drafts, MD 12/07/15 1409

## 2015-12-06 NOTE — ED Notes (Signed)
States she developed pain and sore area to corner of mouth about 1 week

## 2015-12-27 ENCOUNTER — Emergency Department
Admission: EM | Admit: 2015-12-27 | Discharge: 2015-12-27 | Disposition: A | Discharge status: Home / Self Care | Payer: Self-pay | Attending: Emergency Medicine | Admitting: Emergency Medicine

## 2015-12-27 ENCOUNTER — Emergency Department: Payer: Self-pay

## 2015-12-27 DIAGNOSIS — Z791 Long term (current) use of non-steroidal anti-inflammatories (NSAID): Secondary | ICD-10-CM | POA: Insufficient documentation

## 2015-12-27 DIAGNOSIS — Y998 Other external cause status: Secondary | ICD-10-CM | POA: Insufficient documentation

## 2015-12-27 DIAGNOSIS — Z7982 Long term (current) use of aspirin: Secondary | ICD-10-CM | POA: Insufficient documentation

## 2015-12-27 DIAGNOSIS — W010XXA Fall on same level from slipping, tripping and stumbling without subsequent striking against object, initial encounter: Secondary | ICD-10-CM | POA: Insufficient documentation

## 2015-12-27 DIAGNOSIS — Z79899 Other long term (current) drug therapy: Secondary | ICD-10-CM | POA: Insufficient documentation

## 2015-12-27 DIAGNOSIS — E785 Hyperlipidemia, unspecified: Secondary | ICD-10-CM | POA: Insufficient documentation

## 2015-12-27 DIAGNOSIS — Y9389 Activity, other specified: Secondary | ICD-10-CM | POA: Insufficient documentation

## 2015-12-27 DIAGNOSIS — Z792 Long term (current) use of antibiotics: Secondary | ICD-10-CM | POA: Insufficient documentation

## 2015-12-27 DIAGNOSIS — S8001XA Contusion of right knee, initial encounter: Secondary | ICD-10-CM | POA: Insufficient documentation

## 2015-12-27 DIAGNOSIS — Y9289 Other specified places as the place of occurrence of the external cause: Secondary | ICD-10-CM | POA: Insufficient documentation

## 2015-12-27 DIAGNOSIS — I10 Essential (primary) hypertension: Secondary | ICD-10-CM | POA: Insufficient documentation

## 2015-12-27 DIAGNOSIS — F17210 Nicotine dependence, cigarettes, uncomplicated: Secondary | ICD-10-CM | POA: Insufficient documentation

## 2015-12-27 MED ORDER — OXYCODONE-ACETAMINOPHEN 5-325 MG PO TABS
1.0000 | ORAL_TABLET | Freq: Once | ORAL | Status: AC
  Administered 2015-12-27: 1 via ORAL
  Filled 2015-12-27: qty 1

## 2015-12-27 MED ORDER — OXYCODONE-ACETAMINOPHEN 7.5-325 MG PO TABS: 1.0000 | ORAL_TABLET | Freq: Four times a day (QID) | ORAL | Status: DC | PRN

## 2015-12-27 MED ORDER — IBUPROFEN 600 MG PO TABS
600.0000 mg | ORAL_TABLET | Freq: Once | ORAL | Status: AC
  Administered 2015-12-27: 600 mg via ORAL
  Filled 2015-12-27: qty 1

## 2015-12-27 MED ORDER — IBUPROFEN 800 MG PO TABS: 800.0000 mg | ORAL_TABLET | Freq: Three times a day (TID) | ORAL | Status: DC | PRN

## 2015-12-27 NOTE — Discharge Instructions (Signed)
Contusion A contusion is a deep bruise. Contusions happen when an injury causes bleeding under the skin. Symptoms of bruising include pain, swelling, and discolored skin. The skin may turn blue, purple, or yellow. HOME CARE: Wear Ace wrap and ambulate with support for 2-3 days as needed.  Rest the injured area.  If told, put ice on the injured area.  Put ice in a plastic bag.  Place a towel between your skin and the bag.  Leave the ice on for 20 minutes, 2-3 times per day.  If told, put light pressure (compression) on the injured area using an elastic bandage. Make sure the bandage is not too tight. Remove it and put it back on as told by your doctor.  If possible, raise (elevate) the injured area above the level of your heart while you are sitting or lying down.  Take over-the-counter and prescription medicines only as told by your doctor. GET HELP IF:  Your symptoms do not get better after several days of treatment.  Your symptoms get worse.  You have trouble moving the injured area. GET HELP RIGHT AWAY IF:   You have very bad pain.  You have a loss of feeling (numbness) in a hand or foot.  Your hand or foot turns pale or cold.   This information is not intended to replace advice given to you by your health care provider. Make sure you discuss any questions you have with your health care provider.   Document Released: 03/27/2008 Document Revised: 06/30/2015 Document Reviewed: 02/24/2015 Elsevier Interactive Patient Education 2016 [place] A contusion is a deep bruise. Contusions are the result of a blunt injury to tissues and muscle fibers under the skin. The injury causes bleeding under the skin. The skin overlying the contusion may turn blue, purple, or yellow. Minor injuries will give you a painless contusion, but more severe contusions may stay painful and swollen for a few weeks.  CAUSES  This condition is usually caused by a blow, trauma, or direct  force to an area of the body. SYMPTOMS  Symptoms of this condition include:  Swelling of the injured area.  Pain and tenderness in the injured area.  Discoloration. The area may have redness and then turn blue, purple, or yellow. DIAGNOSIS  This condition is diagnosed based on a physical exam and medical history. An X-ray, CT scan, or MRI may be needed to determine if there are any associated injuries, such as broken bones (fractures). TREATMENT  Specific treatment for this condition depends on what area of the body was injured. In general, the best treatment for a contusion is resting, icing, applying pressure to (compression), and elevating the injured area. This is often called the RICE strategy. Over-the-counter anti-inflammatory medicines may also be recommended for pain control.  HOME CARE INSTRUCTIONS   Rest the injured area.  If directed, apply ice to the injured area:  Put ice in a plastic bag.  Place a towel between your skin and the bag.  Leave the ice on for 20 minutes, 2-3 times per day.  If directed, apply light compression to the injured area using an elastic bandage. Make sure the bandage is not wrapped too tightly. Remove and reapply the bandage as directed by your health care provider.  If possible, raise (elevate) the injured area above the level of your heart while you are sitting or lying down.  Take over-the-counter and prescription medicines only as told by your health care provider. [place]  IF:  Your symptoms do not improve after several days of treatment.  Your symptoms get worse.  You have difficulty moving the injured area. SEEK IMMEDIATE MEDICAL CARE IF:   You have severe pain.  You have numbness in a hand or foot.  Your hand or foot turns pale or cold.   This information is not intended to replace advice given to you by your health care provider. Make sure you discuss any questions you have with your health care provider.     Document Released: 07/19/2005 Document Revised: 06/30/2015 Document Reviewed: 02/24/2015 Elsevier Interactive Patient Education [employer].

## 2015-12-27 NOTE — ED Notes (Signed)
Pt reports tripping and injuring her right knee. Pt able to ambulate without assistance

## 2015-12-27 NOTE — ED Notes (Signed)
States she fell about 2 hours ago  Landed on right knee  No deformity noted but having increased pain with standing

## 2015-12-27 NOTE — ED Provider Notes (Signed)
[facility] Emergency Department Provider Note  ____________________________________________  Time seen: Approximately 2:28 PM  I have reviewed the triage vital signs and the nursing notes.   HISTORY  Chief Complaint Extremity Pain    HPI Carla [name] is a 55 y.o. female patient complaining of right knee pain secondary to a fall. Patient states she stepped on uneven a minute and fell landing on her right knee. Patient state increased pain with weightbearing. Patient rates her pain as 8/10. No palliative measures taken for this complaint.This occurred approximately 2 hours ago.   Past Medical History  Diagnosis Date  . Hypertension   . Hyperlipidemia   . GERD (gastroesophageal reflux disease)   . Bell's palsy   . Morbid obesity with BMI of 40.0-44.9, adult [facility])     Patient Active Problem List   Diagnosis Date Noted  . Chest pain 08/06/2012    Past Surgical History  Procedure Laterality Date  . Cholecystectomy    . Vaginal hysterectomy      Current Outpatient Rx  Name  Route  Sig  Dispense  Refill  . aspirin EC 81 MG tablet   Oral   Take 81 mg by mouth daily.         [name] atorvastatin (LIPITOR) 40 MG tablet   Oral   Take 40 mg by mouth at bedtime.          . cephALEXin (KEFLEX) 500 MG capsule   Oral   Take 1 capsule (500 mg total) by mouth 3 (three) times daily.   21 capsule   0   . citalopram (CELEXA) 20 MG tablet   Oral   Take 20 mg by mouth daily.         [name] ibuprofen (ADVIL,MOTRIN) 800 MG tablet   Oral   Take 1 tablet (800 mg total) by mouth every 8 (eight) hours as needed.   30 tablet   0   . ibuprofen (ADVIL,MOTRIN) 800 MG tablet   Oral   Take 1 tablet (800 mg total) by mouth every 8 (eight) hours as needed.   30 tablet   0   . meloxicam (MOBIC) 15 MG tablet   Oral   Take 1 tablet (15 mg total) by mouth daily.   30 tablet   0   . metoprolol tartrate (LOPRESSOR) 25 MG tablet   Oral   Take 25 mg by mouth 2  (two) times daily.         . mupirocin ointment (BACTROBAN) 2 %      Apply to affected area 3 times daily   22 g   0   . ondansetron (ZOFRAN ODT) 4 MG disintegrating tablet   Oral   Take 1 tablet (4 mg total) by mouth every 6 (six) hours as needed for nausea or vomiting.   20 tablet   0   . oxyCODONE-acetaminophen (PERCOCET) 7.5-325 MG tablet   Oral   Take 1 tablet by mouth every 6 (six) hours as needed for severe pain.   12 tablet   0     Allergies Review of patient's allergies indicates no known allergies.  Family History  Problem Relation Age of Onset  . Hypertension Mother   . Heart disease Mother   . Heart attack Mother   . Hypertension Father     Social History Social History  Substance Use Topics  . Smoking status: Current Some Day Smoker -- 0.50 packs/day    Types: Cigarettes  . Smokeless  tobacco: Not on file  . Alcohol Use: No    Review of Systems Constitutional: No fever/chills Eyes: No visual changes. ENT: No sore throat. Cardiovascular: Denies chest pain. Respiratory: Denies shortness of breath. Gastrointestinal: No abdominal pain.  No nausea, no vomiting.  No diarrhea.  No constipation. Genitourinary: Negative for dysuria. Musculoskeletal: Negative for back pain. Skin: Negative for rash. Neurological: Negative for headaches, focal weakness or numbness. Endocrine:Hypertension and hyperlipidemia. 10-point ROS otherwise negative.  ____________________________________________   PHYSICAL EXAM:  VITAL SIGNS: ED Triage Vitals  Enc Vitals Group     BP 12/27/15 1234 185/106 mmHg     Pulse Rate 12/27/15 1234 77     Resp 12/27/15 1234 18     Temp 12/27/15 1234 97.8 F (36.6 C)     Temp src --      SpO2 12/27/15 1234 99 %     Weight 12/27/15 1309 225 lb (102.059 kg)     Height 12/27/15 1309 5\' 3"  (1.6 m)     Head Cir --      Peak Flow --      Pain Score 12/27/15 1234 8     Pain Loc --      Pain Edu? --      Excl. in [place]? --      Constitutional: Alert and oriented. Well appearing and in no acute distress. Eyes: Conjunctivae are normal. PERRL. EOMI. Head: Atraumatic. Nose: No congestion/rhinnorhea. Mouth/Throat: Mucous membranes are moist.  Oropharynx non-erythematous. Neck: No stridor.  No cervical spine tenderness to palpation. Hematological/Lymphatic/Immunilogical: No cervical lymphadenopathy. Cardiovascular: Normal rate, regular rhythm. Grossly normal heart sounds.  Good peripheral circulation. Respiratory: Normal respiratory effort.  No retractions. Lungs CTAB. Gastrointestinal: Soft and nontender. No distention. No abdominal bruits. No CVA tenderness. Musculoskeletal: No lower extremity tenderness nor edema.  No joint effusions. Neurologic:  Normal speech and language. No gross focal neurologic deficits are appreciated. No gait instability. Skin:  Skin is warm, dry and intact. No rash noted. Psychiatric: Mood and affect are normal. Speech and behavior are normal.  ____________________________________________   LABS (all labs ordered are listed, but only abnormal results are displayed)  Labs Reviewed - No data to display ____________________________________________  EKG   ____________________________________________  RADIOLOGY  No acute findings on x-ray. Patient does have moderate arthritis. ____________________________________________   PROCEDURES  Procedure(s) performed: None  Critical Care performed: No  ____________________________________________   INITIAL IMPRESSION / ASSESSMENT AND PLAN / ED COURSE  Pertinent labs & imaging results that were available during my care of the patient were reviewed by me and considered in my medical decision making (see chart for details).  Right knee contusion secondary to fall. Discussed x-ray findings with patient. Patient placed in Ace wrap and use crutches for ambulation. Patient advised follow-up family doctor in 3 days for reevaluation.  Patient given prescription for ibuprofen and Percocets. ____________________________________________   FINAL CLINICAL IMPRESSION(S) / ED DIAGNOSES  Final diagnoses:  Knee contusion, right, initial encounter      Sable Feil, PA-C 12/27/15 1435  Eula Listen, MD 12/27/15 1534

## 2016-01-19 DIAGNOSIS — M25562 Pain in left knee: Secondary | ICD-10-CM

## 2016-01-19 DIAGNOSIS — I10 Essential (primary) hypertension: Secondary | ICD-10-CM | POA: Insufficient documentation

## 2016-01-19 DIAGNOSIS — K219 Gastro-esophageal reflux disease without esophagitis: Secondary | ICD-10-CM

## 2016-01-19 DIAGNOSIS — R7303 Prediabetes: Secondary | ICD-10-CM | POA: Insufficient documentation

## 2016-01-19 DIAGNOSIS — E782 Mixed hyperlipidemia: Secondary | ICD-10-CM | POA: Insufficient documentation

## 2016-01-19 DIAGNOSIS — F339 Major depressive disorder, recurrent, unspecified: Secondary | ICD-10-CM | POA: Insufficient documentation

## 2016-01-19 DIAGNOSIS — E785 Hyperlipidemia, unspecified: Secondary | ICD-10-CM | POA: Insufficient documentation

## 2016-01-19 DIAGNOSIS — M25561 Pain in right knee: Secondary | ICD-10-CM

## 2016-01-19 DIAGNOSIS — G8929 Other chronic pain: Secondary | ICD-10-CM | POA: Insufficient documentation

## 2016-01-19 HISTORY — DX: Gastro-esophageal reflux disease without esophagitis: K21.9

## 2016-01-19 HISTORY — DX: Essential (primary) hypertension: I10

## 2016-01-19 HISTORY — DX: Major depressive disorder, recurrent, unspecified: F33.9

## 2016-01-24 ENCOUNTER — Ambulatory Visit: Payer: Self-pay | Attending: Oncology

## 2016-01-24 ENCOUNTER — Ambulatory Visit
Admission: RE | Admit: 2016-01-24 | Discharge: 2016-01-24 | Disposition: A | Discharge status: Home / Self Care | Payer: Self-pay | Source: Ambulatory Visit | Attending: Oncology | Admitting: Oncology

## 2016-01-24 VITALS — BP 115/77 | HR 60 | Temp 97.9°F | Resp 18 | Ht 63.39 in | Wt 235.3 lb

## 2016-01-24 DIAGNOSIS — Z0000 Encounter for general adult medical examination without abnormal findings: Secondary | ICD-10-CM

## 2016-01-24 NOTE — Progress Notes (Signed)
Subjective:     Patient ID: ZURIA STHILL, female   DOB: 09-Mar-1961, 55 y.o.   MRN: [id number]  HPI   Review of Systems     Objective:   Physical Exam  Pulmonary/Chest: Right breast exhibits no inverted nipple, no mass, no nipple discharge, no skin change and no tenderness. Left breast exhibits no inverted nipple, no mass, no nipple discharge, no skin change and no tenderness. Breasts are symmetrical.       Assessment:     55 year old  patient presents for [facility] clinic visit.  Patient screened, and meets BCCCP eligibility.  Patient does not have insurance, Medicare or Medicaid.  Handout given on Affordable Care Act.  Instructed patient on breast self-exam using teach back method.  CBE unremarkable.  No mass or lump palpated.  Patient states her mother was followed by Dr. Inez Pilgrim for breast cancer twice.     Plan:     Sent for bilateral screening mammogram.

## 2016-01-26 NOTE — Progress Notes (Signed)
Letter mailed from [facility] to notify of normal mammogram results.  Patient to return in one year for annual screening.  Copy to [facility]. 

## 2016-01-28 ENCOUNTER — Emergency Department
Admission: EM | Admit: 2016-01-28 | Discharge: 2016-01-28 | Disposition: A | Discharge status: Home / Self Care | Payer: Self-pay | Attending: Emergency Medicine | Admitting: Emergency Medicine

## 2016-01-28 ENCOUNTER — Encounter: Payer: Self-pay | Admitting: Emergency Medicine

## 2016-01-28 DIAGNOSIS — Z7982 Long term (current) use of aspirin: Secondary | ICD-10-CM | POA: Insufficient documentation

## 2016-01-28 DIAGNOSIS — I10 Essential (primary) hypertension: Secondary | ICD-10-CM | POA: Insufficient documentation

## 2016-01-28 DIAGNOSIS — H6501 Acute serous otitis media, right ear: Secondary | ICD-10-CM | POA: Insufficient documentation

## 2016-01-28 DIAGNOSIS — F17210 Nicotine dependence, cigarettes, uncomplicated: Secondary | ICD-10-CM | POA: Insufficient documentation

## 2016-01-28 DIAGNOSIS — G510 Bell's palsy: Secondary | ICD-10-CM | POA: Insufficient documentation

## 2016-01-28 DIAGNOSIS — Z79899 Other long term (current) drug therapy: Secondary | ICD-10-CM | POA: Insufficient documentation

## 2016-01-28 DIAGNOSIS — Z6841 Body Mass Index (BMI) 40.0 and over, adult: Secondary | ICD-10-CM | POA: Insufficient documentation

## 2016-01-28 DIAGNOSIS — E785 Hyperlipidemia, unspecified: Secondary | ICD-10-CM | POA: Insufficient documentation

## 2016-01-28 MED ORDER — NAPROXEN 500 MG PO TABS: 500.0000 mg | ORAL_TABLET | Freq: Two times a day (BID) | ORAL | Status: DC

## 2016-01-28 MED ORDER — AMOXICILLIN 875 MG PO TABS: 875.0000 mg | ORAL_TABLET | Freq: Two times a day (BID) | ORAL | Status: DC

## 2016-01-28 MED ORDER — FLUTICASONE PROPIONATE 50 MCG/ACT NA SUSP: 2.0000 | Freq: Every day | NASAL | Status: DC

## 2016-01-28 NOTE — ED Notes (Signed)
Pt states that she woke up 3 days ago having rt lower jaw pain that radiates up to her rt ear. Pt states that she doesn't have any teeth so doesn't think the pain is from her mouth, pt does state that it hurts to touch her rt ear. Pt denies cough or congestion

## 2016-01-28 NOTE — ED Notes (Signed)
Right ear and right face pain, onset of symptoms 2 days ago.

## 2016-01-28 NOTE — ED Provider Notes (Signed)
[facility] Emergency Department Provider Note  ____________________________________________  Time seen: Approximately 2:00 PM  I have reviewed the triage vital signs and the nursing notes.   HISTORY  Chief Complaint Otalgia    HPI Carla [name] is a 55 y.o. female , NAD, presents to the emergency department with three-day history of right ear pain that radiates to the right upper jaw. States she woke with the pain and has not changed since the onset. Has not taken anything to decrease the pain denies any nasal congestion, runny nose, sinus pressure, headache. Denies any changes in hearing or tinnitus. Has not had any cough or congestion. Does not have any drainage from the ears. No known sick contacts. Notes she had her teeth removed some time ago and has not had any mouth pain. Pain worsens when she touches the ear or side of her face. Has not noticed any rashes or skin sores.   Past Medical History  Diagnosis Date  . Hypertension   . Hyperlipidemia   . GERD (gastroesophageal reflux disease)   . Bell's palsy   . Morbid obesity with BMI of 40.0-44.9, adult [facility])     Patient Active Problem List   Diagnosis Date Noted  . Chest pain 08/06/2012    Past Surgical History  Procedure Laterality Date  . Cholecystectomy    . Vaginal hysterectomy      Current Outpatient Rx  Name  Route  Sig  Dispense  Refill  . amoxicillin (AMOXIL) 875 MG tablet   Oral   Take 1 tablet (875 mg total) by mouth 2 (two) times daily.   20 tablet   0   . aspirin EC 81 MG tablet   Oral   Take 81 mg by mouth daily.         [name] atorvastatin (LIPITOR) 40 MG tablet   Oral   Take 40 mg by mouth at bedtime.          . citalopram (CELEXA) 20 MG tablet   Oral   Take 20 mg by mouth daily. Reported on 01/24/2016         . fluticasone (FLONASE) 50 MCG/ACT nasal spray   Each Nare   Place 2 sprays into both nostrils daily.   16 g   0   . metoprolol tartrate (LOPRESSOR) 25  MG tablet   Oral   Take 25 mg by mouth 2 (two) times daily.         . naproxen (NAPROSYN) 500 MG tablet   Oral   Take 1 tablet (500 mg total) by mouth 2 (two) times daily with a meal.   14 tablet   0     Allergies Review of patient's allergies indicates no known allergies.  Family History  Problem Relation Age of Onset  . Hypertension Mother   . Heart disease Mother   . Heart attack Mother   . Breast cancer Mother   . Hypertension Father   . Breast cancer Paternal Aunt     Social History Social History  Substance Use Topics  . Smoking status: Current Some Day Smoker -- 0.50 packs/day    Types: Cigarettes  . Smokeless tobacco: None  . Alcohol Use: No     Review of Systems  Constitutional: No fever/chills Eyes: No visual changes. No discharge, redness ENT: Positive right ear pain. No sore throat, mouth pain, nasal congestion, runny nose, sinus pressure. Cardiovascular: No chest pain. Respiratory: No cough. No shortness of breath.  Gastrointestinal: No  abdominal pain.  No nausea, vomiting.  Musculoskeletal: Negative for neck pain.  Skin: Negative for rash him a skin sores. Neurological: Negative for headaches, focal weakness or numbness. 10-point ROS otherwise negative.  ____________________________________________   PHYSICAL EXAM:  VITAL SIGNS: ED Triage Vitals  Enc Vitals Group     BP 01/28/16 1244 132/80 mmHg     Pulse Rate 01/28/16 1244 51     Resp 01/28/16 1244 18     Temp 01/28/16 1244 98.1 F (36.7 C)     Temp Source 01/28/16 1244 Oral     SpO2 01/28/16 1244 95 %     Weight 01/28/16 1244 230 lb (104.327 kg)     Height 01/28/16 1244 5\' 3"  (1.6 m)     Head Cir --      Peak Flow --      Pain Score 01/28/16 1245 9     Pain Loc --      Pain Edu? --      Excl. in [place]? --     Constitutional: Alert and oriented. Well appearing and in no acute distress. Eyes: Conjunctivae are normal. PERRL. EOMI without pain.  Head: Atraumatic. ENT:      Ears:  Right TM visualized with moderate serous effusion, mild bulging but no erythema or perforation. Light reflex is dull. Left TM visualized without effusion, bulging, erythema, perforation. Light reflex is normal.      Nose: No congestion/rhinnorhea.      Mouth/Throat: Mucous membranes are moist.  Gumline without any erythema, swelling or skin sores. No pharyngeal erythema, swelling or exudates. Neck: No stridor. Supple with full range of motion. Hematological/Lymphatic/Immunilogical: No cervical lymphadenopathy. Cardiovascular: Normal rate, regular rhythm. Normal S1 and S2.   Respiratory: Normal respiratory effort without tachypnea or retractions. Lungs CTAB. Neurologic:  Normal speech and language. No gross focal neurologic deficits are appreciated.  Skin:  Skin is warm, dry and intact. No rash noted. Psychiatric: Mood and affect are normal. Speech and behavior are normal. Patient exhibits appropriate insight and judgement.   ____________________________________________   LABS  None  ____________________________________________  EKG  None ____________________________________________  RADIOLOGY  None ____________________________________________    PROCEDURES  Procedure(s) performed: None    Medications - No data to display   ____________________________________________   INITIAL IMPRESSION / ASSESSMENT AND PLAN / ED COURSE  Patient's diagnosis is consistent with right acute serous otitis media. Patient will be discharged home with prescriptions for amoxicillin, Flonase, Naprosyn to take as directed. Patient is to follow up with her primary care provider or [facility] if symptoms persist past this treatment course. Patient is given ED precautions to return to the ED for any worsening or new symptoms.    ____________________________________________  FINAL CLINICAL IMPRESSION(S) / ED DIAGNOSES  Final diagnoses:  Right acute serous otitis media, recurrence  not specified      NEW MEDICATIONS STARTED DURING THIS VISIT:  New Prescriptions   AMOXICILLIN (AMOXIL) 875 MG TABLET    Take 1 tablet (875 mg total) by mouth 2 (two) times daily.   FLUTICASONE (FLONASE) 50 MCG/ACT NASAL SPRAY    Place 2 sprays into both nostrils daily.   NAPROXEN (NAPROSYN) 500 MG TABLET    Take 1 tablet (500 mg total) by mouth 2 (two) times daily with a meal.         Braxton Feathers, PA-C 01/28/16 1411  Daymon Larsen, MD 01/28/16 1438

## 2016-01-28 NOTE — Discharge Instructions (Signed)
Otitis Media, Adult °Otitis media is redness, soreness, and puffiness (swelling) in the space just behind your eardrum (middle ear). It may be caused by allergies or infection. It often happens along with a cold. °HOME CARE °· Take your medicine as told. Finish it even if you start to feel better. °· Only take over-the-counter or prescription medicines for pain, discomfort, or fever as told by your doctor. °· Follow up with your doctor as told. °GET HELP IF: °· You have otitis media only in one ear, or bleeding from your nose, or both. °· You notice a lump on your neck. °· You are not getting better in 3-5 days. °· You feel worse instead of better. °GET HELP RIGHT AWAY IF:  °· You have pain that is not helped with medicine. °· You have puffiness, redness, or pain around your ear. °· You get a stiff neck. °· You cannot move part of your face (paralysis). °· You notice that the bone behind your ear hurts when you touch it. °MAKE SURE YOU:  °· Understand these instructions. °· Will watch your condition. °· Will get help right away if you are not doing well or get worse. °  °This information is not intended to replace advice given to you by your health care provider. Make sure you discuss any questions you have with your health care provider. °  °Document Released: 03/27/2008 Document Revised: 10/30/2014 Document Reviewed: 05/06/2013 °Elsevier Interactive Patient Education ©2016 [employer] ° ° °

## 2016-06-01 DIAGNOSIS — Z85828 Personal history of other malignant neoplasm of skin: Secondary | ICD-10-CM | POA: Insufficient documentation

## 2016-06-01 HISTORY — DX: Personal history of other malignant neoplasm of skin: Z85.828

## 2016-07-04 IMAGING — CT CT ABD-PELV W/ CM
2 of 5 series · 16 of 46 positions shown, 18 images · IV contrast (omnipaque)
Comparison: 12/23/2012 CT.

CLINICAL DATA: 54-year-old female complaining of mid to lower
abdominal pain radiating to lower back for the past 3 days worse
with eating and drinking. History of chronic urinary tract
infections, hypertension, hysterectomy, appendectomy and
cholecystectomy. History of skin cancer. Initial encounter.

EXAM:
CT ABDOMEN AND PELVIS WITH CONTRAST
TECHNIQUE: Multidetector CT imaging of the abdomen and pelvis was performed
using the standard protocol following bolus administration of
intravenous contrast.
CONTRAST:  100 cc Omnipaque 300.

[Series 2: routine abd pel with · axial · 0.75mm/px · z∈[-268,+182]mm · 13 of 101 slices shown, 15 images]
[im 6/101  soft-tissue]
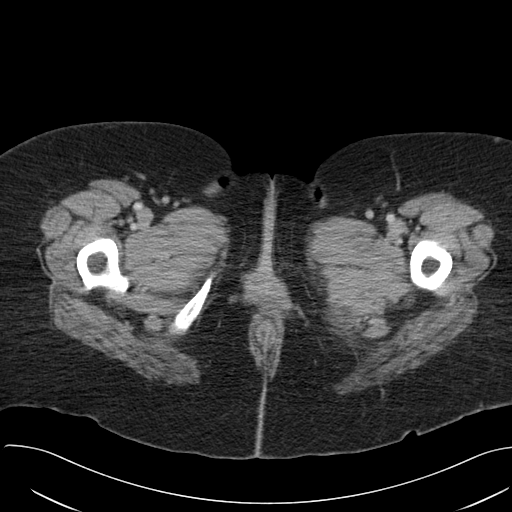
[im 6/101  bone]
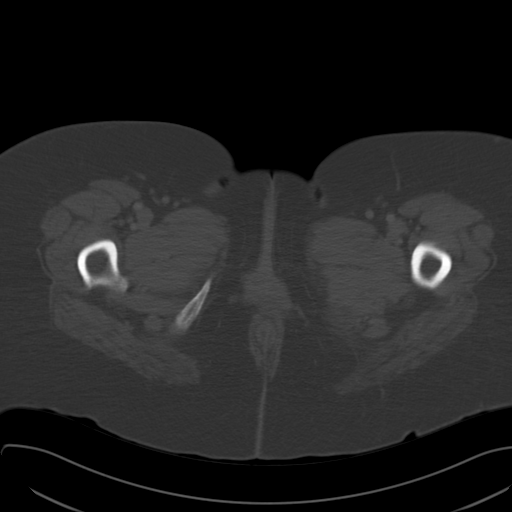
[im 16/101  soft-tissue]
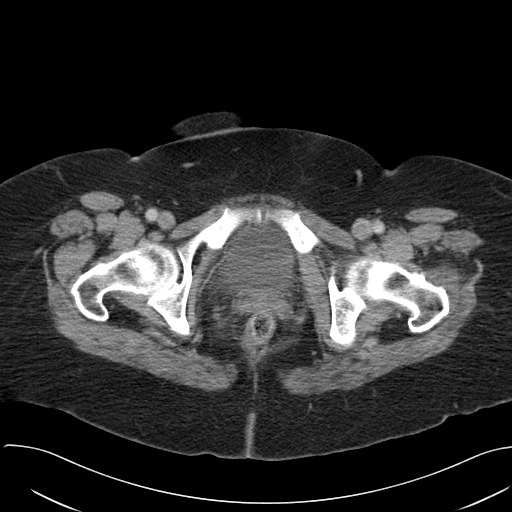
[im 21/101  soft-tissue]
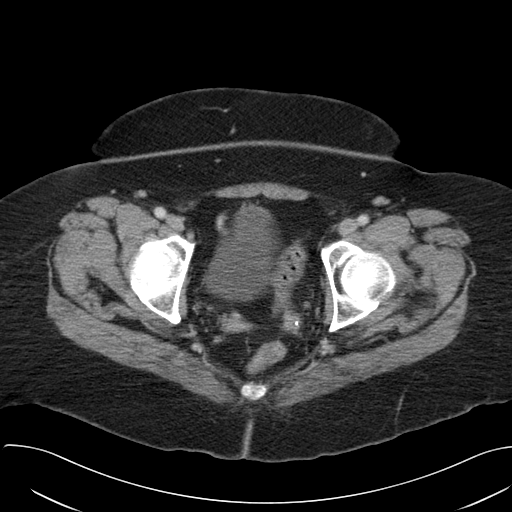
[im 31/101  soft-tissue]
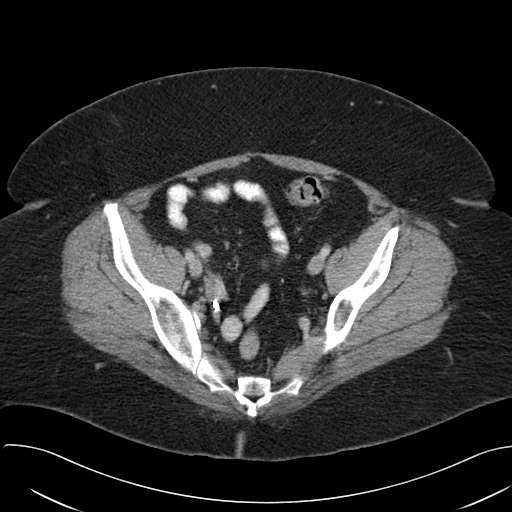
[im 36/101  soft-tissue]
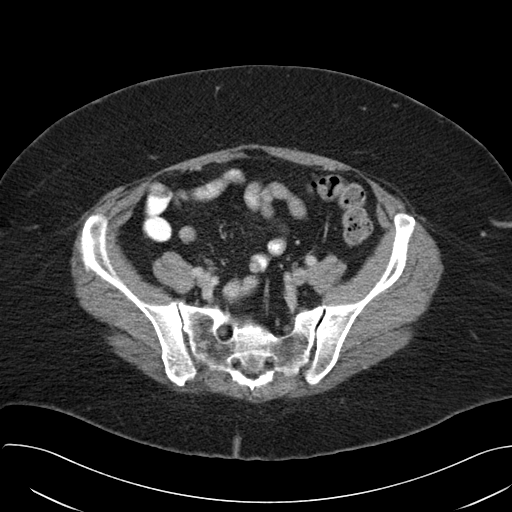
[im 46/101  soft-tissue]
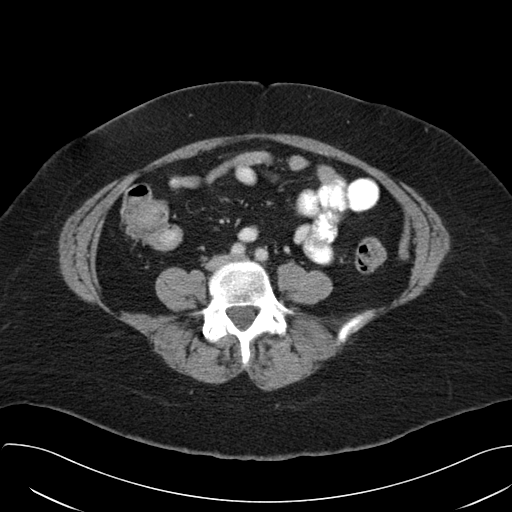
[im 51/101  soft-tissue]
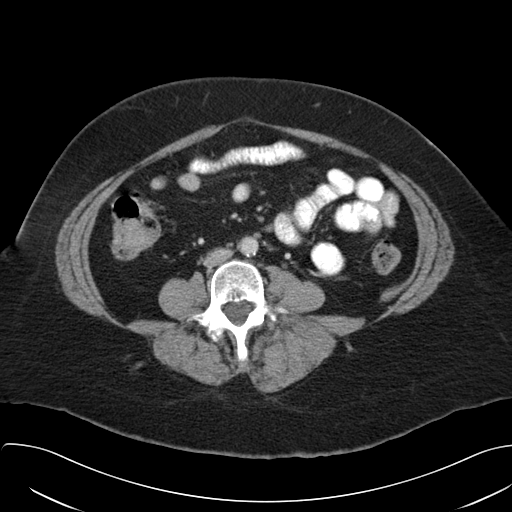
[im 56/101  soft-tissue]
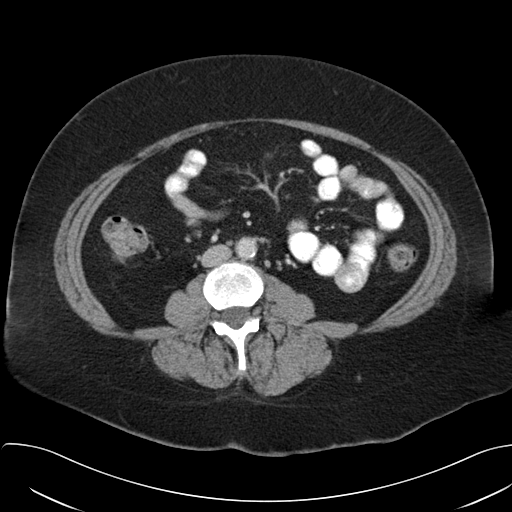
[im 66/101  soft-tissue]
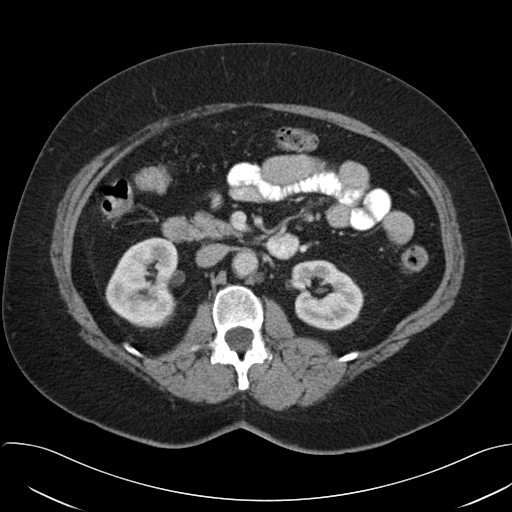
[im 66/101  bone]
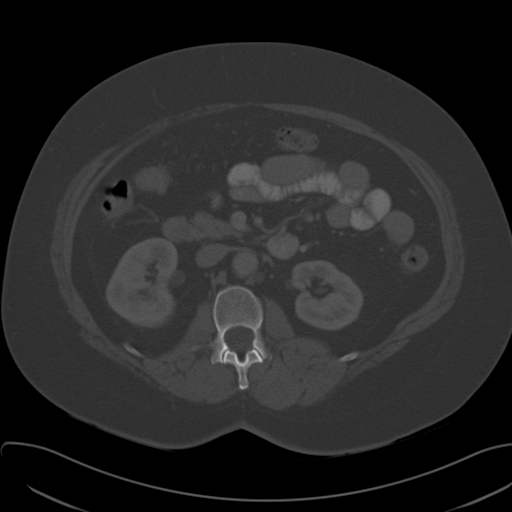
[im 71/101  soft-tissue]
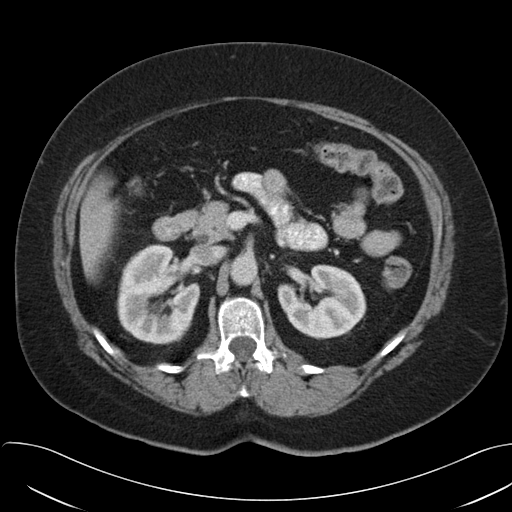
[im 81/101  soft-tissue]
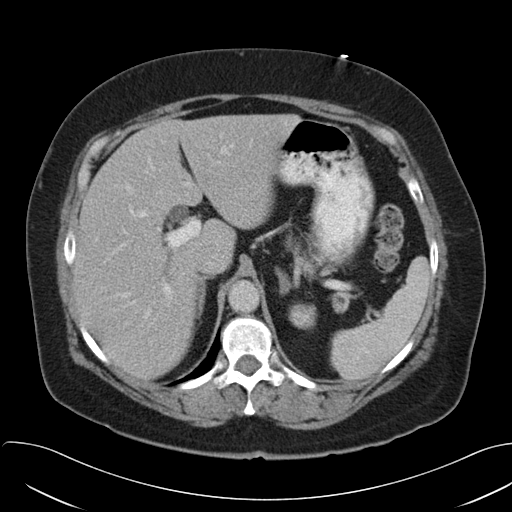
[im 86/101  soft-tissue]
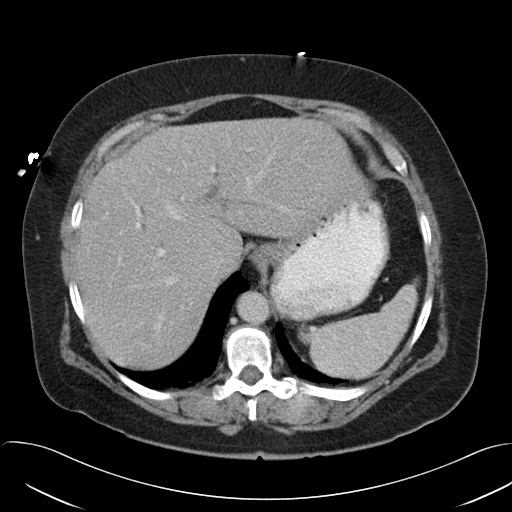
[im 96/101  soft-tissue]
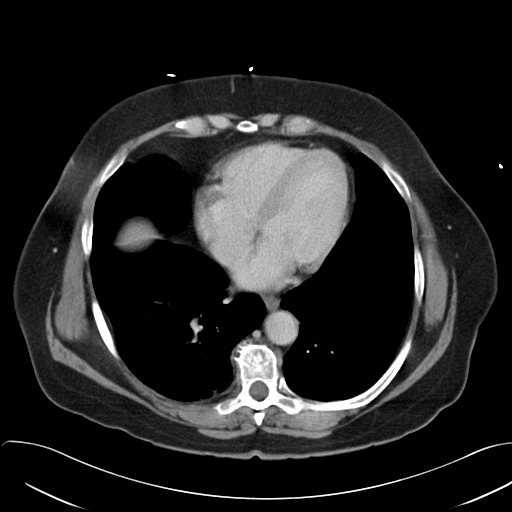

[Series 6: cor routine abd pel with · coronal · 0.82mm/px · 3 of 155 slices shown]
[im 52/155  soft-tissue]
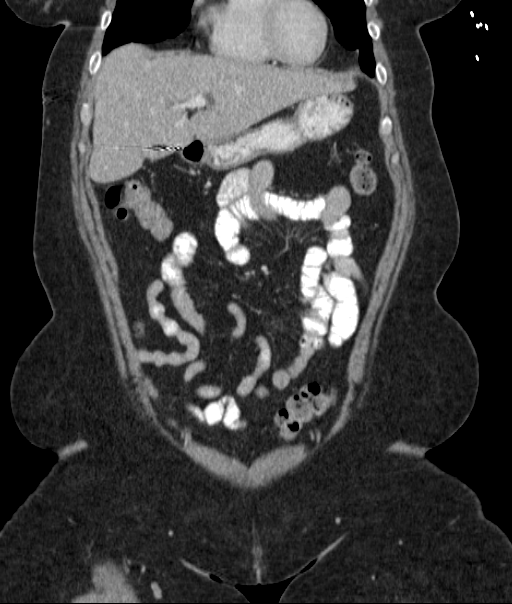
[im 69/155  soft-tissue]
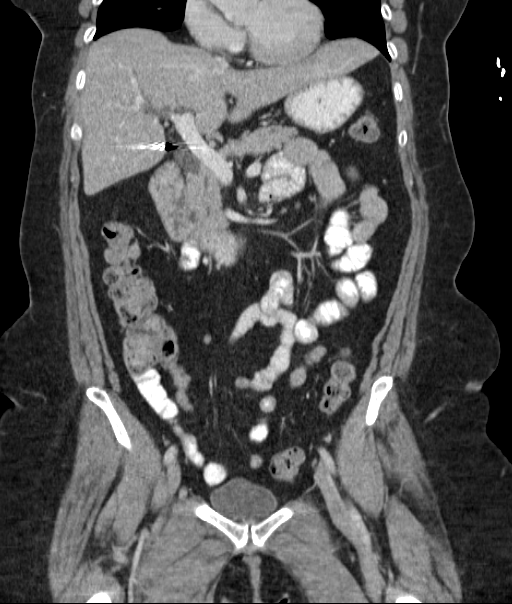
[im 86/155  soft-tissue]
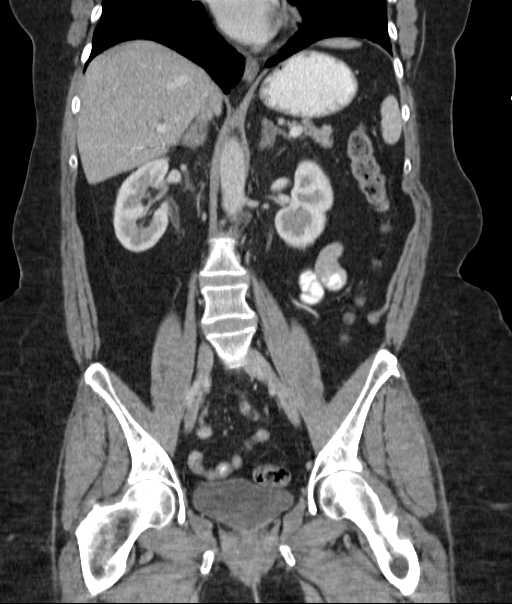

[16 of 46 positions shown; findings below may reference images not displayed]

FINDINGS: No extra luminal bowel inflammatory process, free fluid or free air.
No bowel containing hernia.

Mild prominence of intrahepatic and extrahepatic biliary ducts
unchanged from prior exam most consistent with result of prior
cholecystectomy.

No worrisome hepatic, splenic, pancreatic, renal or right adrenal
lesion. Hyperplasia with nodularity left adrenal gland unchanged.

Mild atherosclerotic type changes abdominal aorta and iliac arteries
without aneurysmal dilation.

No adenopathy.

No bony destructive lesion. Mild curvature lumbar spine convex to
the right with mild lumbar degenerative changes.

Post hysterectomy. Noncontrast filled views of the urinary bladder
unremarkable.

Lung bases clear.  Heart size within normal limits.
IMPRESSION: No acute abnormality.  Please see above.

## 2016-07-16 DIAGNOSIS — Z6841 Body Mass Index (BMI) 40.0 and over, adult: Secondary | ICD-10-CM

## 2016-07-23 ENCOUNTER — Emergency Department: Payer: Self-pay

## 2016-07-23 ENCOUNTER — Emergency Department
Admission: EM | Admit: 2016-07-23 | Discharge: 2016-07-23 | Disposition: A | Discharge status: Home / Self Care | Payer: Self-pay | Attending: Emergency Medicine | Admitting: Emergency Medicine

## 2016-07-23 DIAGNOSIS — I10 Essential (primary) hypertension: Secondary | ICD-10-CM | POA: Insufficient documentation

## 2016-07-23 DIAGNOSIS — R609 Edema, unspecified: Secondary | ICD-10-CM

## 2016-07-23 DIAGNOSIS — Z79899 Other long term (current) drug therapy: Secondary | ICD-10-CM | POA: Insufficient documentation

## 2016-07-23 DIAGNOSIS — Z7982 Long term (current) use of aspirin: Secondary | ICD-10-CM | POA: Insufficient documentation

## 2016-07-23 DIAGNOSIS — F8081 Childhood onset fluency disorder: Secondary | ICD-10-CM | POA: Insufficient documentation

## 2016-07-23 DIAGNOSIS — F17210 Nicotine dependence, cigarettes, uncomplicated: Secondary | ICD-10-CM | POA: Insufficient documentation

## 2016-07-23 DIAGNOSIS — N3001 Acute cystitis with hematuria: Secondary | ICD-10-CM | POA: Insufficient documentation

## 2016-07-23 DIAGNOSIS — R600 Localized edema: Secondary | ICD-10-CM | POA: Insufficient documentation

## 2016-07-23 LAB — URINALYSIS COMPLETE WITH MICROSCOPIC (ARMC ONLY)
Bilirubin Urine: NEGATIVE
Glucose, UA: NEGATIVE mg/dL
Ketones, ur: NEGATIVE mg/dL
Nitrite: POSITIVE — AB
Protein, ur: 30 mg/dL — AB
Specific Gravity, Urine: 1.011 (ref 1.005–1.030)
pH: 6 (ref 5.0–8.0)

## 2016-07-23 LAB — DIFFERENTIAL
Basophils Absolute: 0.1 10*3/uL (ref 0–0.1)
Basophils Relative: 1 %
Eosinophils Absolute: 0.3 10*3/uL (ref 0–0.7)
Eosinophils Relative: 3 %
Lymphocytes Relative: 31 %
Lymphs Abs: 2.5 10*3/uL (ref 1.0–3.6)
Monocytes Absolute: 0.6 10*3/uL (ref 0.2–0.9)
Monocytes Relative: 7 %
Neutro Abs: 4.5 10*3/uL (ref 1.4–6.5)
Neutrophils Relative %: 58 %

## 2016-07-23 LAB — CBC
HCT: 38.9 % (ref 35.0–47.0)
Hemoglobin: 12.7 g/dL (ref 12.0–16.0)
MCH: 29.9 pg (ref 26.0–34.0)
MCHC: 32.7 g/dL (ref 32.0–36.0)
MCV: 91.4 fL (ref 80.0–100.0)
Platelets: 247 10*3/uL (ref 150–440)
RBC: 4.26 MIL/uL (ref 3.80–5.20)
RDW: 13.8 % (ref 11.5–14.5)
WBC: 7.8 10*3/uL (ref 3.6–11.0)

## 2016-07-23 LAB — COMPREHENSIVE METABOLIC PANEL
ALT: 13 U/L — ABNORMAL LOW (ref 14–54)
AST: 13 U/L — ABNORMAL LOW (ref 15–41)
Albumin: 3.6 g/dL (ref 3.5–5.0)
Alkaline Phosphatase: 93 U/L (ref 38–126)
Anion gap: 8 (ref 5–15)
BUN: 16 mg/dL (ref 6–20)
CO2: 24 mmol/L (ref 22–32)
Calcium: 8.7 mg/dL — ABNORMAL LOW (ref 8.9–10.3)
Chloride: 106 mmol/L (ref 101–111)
Creatinine, Ser: 0.8 mg/dL (ref 0.44–1.00)
GFR calc Af Amer: >60 mL/min (ref >60)
GFR calc non Af Amer: >60 mL/min (ref >60)
Glucose, Bld: 109 mg/dL — ABNORMAL HIGH (ref 65–99)
Potassium: 4.1 mmol/L (ref 3.5–5.1)
Sodium: 138 mmol/L (ref 135–145)
Total Bilirubin: 0.2 mg/dL — ABNORMAL LOW (ref 0.3–1.2)
Total Protein: 7 g/dL (ref 6.5–8.1)

## 2016-07-23 LAB — PROTIME-INR
INR: 0.88
Prothrombin Time: 11.9 seconds (ref 11.4–15.2)

## 2016-07-23 LAB — APTT: aPTT: 26 seconds (ref 24–36)

## 2016-07-23 MED ORDER — CEPHALEXIN 500 MG PO CAPS: 500.0000 mg | ORAL_CAPSULE | Freq: Three times a day (TID) | ORAL | 0 refills | Status: AC

## 2016-07-23 MED ORDER — CEPHALEXIN 500 MG PO CAPS
500.0000 mg | ORAL_CAPSULE | Freq: Once | ORAL | Status: AC
  Administered 2016-07-23: 500 mg via ORAL
  Filled 2016-07-23: qty 1

## 2016-07-23 NOTE — ED Notes (Signed)
Pt returned from [place]

## 2016-07-23 NOTE — ED Notes (Signed)
Pt is at MRI

## 2016-07-23 NOTE — ED Provider Notes (Signed)
[facility] Emergency Department Provider Note   ____________________________________________   First MD Initiated Contact with Patient 07/23/16 1519     (approximate)  I have reviewed the triage vital signs and the nursing notes.   HISTORY  Chief Complaint Aphasia   HPI Carla [name] is a 55 y.o. female with a history of Bell's palsy, hyperlipidemia as well as hypertension and a family history of stroke that is coming in with stuttering speech that started last night at 7 PM. She is denying any weakness or numbness. Michela Pitcher that she had similar symptoms about one year ago and was diagnosed with Bell's palsy however she left facial droop at that time. She is denying any pain. However, she does say she has increased swelling in her right lower extremity. She has chronic edema to the bilateral lower extremities but says over the past 3 days that she has had increased swelling to the right lower extremity.   Past Medical History:  Diagnosis Date  . Bell's palsy   . GERD (gastroesophageal reflux disease)   . Hyperlipidemia   . Hypertension   . Morbid obesity with BMI of 40.0-44.9, adult [facility])     Patient Active Problem List   Diagnosis Date Noted  . Chest pain 08/06/2012    Past Surgical History:  Procedure Laterality Date  . CHOLECYSTECTOMY    . VAGINAL HYSTERECTOMY      Prior to Admission medications   Medication Sig Start Date End Date Taking? Authorizing Provider  amoxicillin (AMOXIL) 875 MG tablet Take 1 tablet (875 mg total) by mouth 2 (two) times daily. 01/28/16   Jami L Hagler, PA-C  aspirin EC 81 MG tablet Take 81 mg by mouth daily.    Historical Provider, MD  atorvastatin (LIPITOR) 40 MG tablet Take 40 mg by mouth at bedtime.     Historical Provider, MD  citalopram (CELEXA) 20 MG tablet Take 20 mg by mouth daily. Reported on 01/24/2016    Historical Provider, MD  fluticasone (FLONASE) 50 MCG/ACT nasal spray Place 2 sprays into both nostrils  daily. 01/28/16   Jami L Hagler, PA-C  metoprolol tartrate (LOPRESSOR) 25 MG tablet Take 25 mg by mouth 2 (two) times daily.    Historical Provider, MD  naproxen (NAPROSYN) 500 MG tablet Take 1 tablet (500 mg total) by mouth 2 (two) times daily with a meal. 01/28/16   Jami L Hagler, PA-C    Allergies Review of patient's allergies indicates no known allergies.  Family History  Problem Relation Age of Onset  . Hypertension Mother   . Heart disease Mother   . Heart attack Mother   . Breast cancer Mother   . Hypertension Father   . Breast cancer Paternal Aunt     Social History Social History  Substance Use Topics  . Smoking status: Current Some Day Smoker    Packs/day: 0.50    Types: Cigarettes  . Smokeless tobacco: Never Used  . Alcohol use No    Review of Systems Constitutional: No fever/chills Eyes: No visual changes. ENT: No sore throat. Cardiovascular: Denies chest pain. Respiratory: Denies shortness of breath. Gastrointestinal: No abdominal pain.  No nausea, no vomiting.  No diarrhea.  No constipation. Genitourinary: Negative for dysuria. Musculoskeletal: Negative for back pain. Skin: Negative for rash. Neurological: Negative for headaches, focal weakness or numbness.  10-point ROS otherwise negative.  ____________________________________________   PHYSICAL EXAM:  VITAL SIGNS: ED Triage Vitals  Enc Vitals Group     BP 07/23/16  1530 128/86     Pulse Rate 07/23/16 1545 (!) 51     Resp 07/23/16 1530 17     Temp 07/23/16 1530 98.1 F (36.7 C)     Temp Source 07/23/16 1530 Oral     SpO2 07/23/16 1530 94 %     Weight 07/23/16 1531 230 lb (104.3 kg)     Height 07/23/16 1531 5\' 3"  (1.6 m)     Head Circumference --      Peak Flow --      Pain Score 07/23/16 1531 10     Pain Loc --      Pain Edu? --      Excl. in [place]? --     Constitutional: Alert and oriented. Well appearing and in no acute distress. Eyes: Conjunctivae are normal. PERRL. EOMI. Head:  Atraumatic. Nose: No congestion/rhinnorhea. Mouth/Throat: Mucous membranes are moist.   Neck: No stridor.   Cardiovascular: Normal rate, regular rhythm. Grossly normal heart sounds.  Good peripheral circulation With intact and bilaterally equal dorsalis pedis pulses. Respiratory: Normal respiratory effort.  No retractions. Lungs CTAB. Gastrointestinal: Soft and nontender. No distention.  Musculoskeletal: Moderate bilateral lower extremity edema with the right lower extremity being greater than the left. Neurologic:  Stuttering speech but with appropriate word choice. Skin:  Skin is warm, dry and intact. No rash noted. Psychiatric: Mood and affect are normal. Speech and behavior are normal.  NIH Stroke Scale  Person Administering Scale: Doran Stabler  Administer stroke scale items in the order listed. Record performance in each category after each subscale exam. Do not go back and change scores. Follow directions provided for each exam technique. Scores should reflect what the patient does, not what the clinician thinks the patient can do. The clinician should record answers while administering the exam and work quickly. Except where indicated, the patient should not be coached (i.e., repeated requests to patient to make a special effort).   1a  Level of consciousness: 0=alert; keenly responsive  1b. LOC questions:  0=Performs both tasks correctly  1c. LOC commands: 0=Performs both tasks correctly  2.  Best Gaze: 0=normal  3.  Visual: 0=No visual loss  4. Facial Palsy: 0=Normal symmetric movement  5a.  Motor left arm: 0=No drift, limb holds 90 (or 45) degrees for full 10 seconds  5b.  Motor right arm: 0=No drift, limb holds 90 (or 45) degrees for full 10 seconds  6a. motor left leg: 2=Some effort against gravity, limb cannot get to or maintain (if cured) 90 (or 45) degrees, drifts down to bed, but has some effort against gravity  6b  Motor right leg:  2=Some effort against gravity,  limb cannot get to or maintain (if cured) 90 (or 45) degrees, drifts down to bed, but has some effort against gravity  7. Limb Ataxia: 0=Absent  8.  Sensory: 0=Normal; no sensory loss  9. Best Language:  1=Mild to moderate aphasia; some obvious loss of fluency or facility of comprehension without significant limitation on ideas expressed or form of expression.  10. Dysarthria: 0=Normal  11. Extinction and Inattention: 0=No abnormality  12. Distal motor function: 0=Normal   Total:   5   ____________________________________________   LABS (all labs ordered are listed, but only abnormal results are displayed)  Labs Reviewed  COMPREHENSIVE METABOLIC PANEL - Abnormal; Notable for the following:       Result Value   Glucose, Bld 109 (*)    Calcium 8.7 (*)    AST  13 (*)    ALT 13 (*)    Total Bilirubin 0.2 (*)    All other components within normal limits  URINALYSIS COMPLETEWITH MICROSCOPIC ([facility] ONLY) - Abnormal; Notable for the following:    Color, Urine YELLOW (*)    APPearance CLOUDY (*)    Hgb urine dipstick 2+ (*)    Protein, ur 30 (*)    Nitrite POSITIVE (*)    Leukocytes, UA 3+ (*)    Bacteria, UA RARE (*)    Squamous Epithelial / LPF 6-30 (*)    All other components within normal limits  PROTIME-INR  APTT  CBC  DIFFERENTIAL  I-STAT TROPOININ, ED   ____________________________________________  EKG  ED ECG REPORT I, Doran Stabler, the attending physician, personally viewed and interpreted this ECG.   Date: 07/23/2016  EKG Time: 1545  Rate: 52  Rhythm: sinus bradycardia  Axis: Normal  Intervals:none  ST&T Change: No ST elevation or depression. Machine read as minimal ST elevation in the inferior leads but this appears to be from a baseline disturbance. No abnormal T-wave inversions.  ____________________________________________  RADIOLOGY  CT Head Wo Contrast (Accession [id number]) (Order [id number])  Imaging  Date: 07/23/2016 Department: [facility] EMERGENCY DEPARTMENT Released By: Donella Stade, RN (auto-released) Authorizing: Merlyn Lot, MD  Exam Information   Status Exam Begun  Exam Ended   Final ----------------------------------------- 4:25 PM on 07/23/2016 -----------------------------------------   07/23/2016 3:20 PM 07/23/2016 3:23 PM  PACS Images   Show images for CT Head Wo Contrast  Study Result   CLINICAL DATA:  Code stroke. 55 year old female with slurred speech since 1900 hours yesterday. Initial encounter.  EXAM: CT HEAD WITHOUT CONTRAST  TECHNIQUE: Contiguous axial images were obtained from the base of the skull through the vertex without intravenous contrast.  COMPARISON:  Head CT without contrast 10/06/2013. Brain MRI 07/22/2012.  FINDINGS: Brain: Stable cerebral volume. No midline shift, ventriculomegaly, mass effect, evidence of mass lesion, intracranial hemorrhage or evidence of cortically based acute infarction. Gray-white matter differentiation is within normal limits throughout the brain.  Vascular: No suspicious intracranial vascular hyperdensity. Mild Calcified atherosclerosis at the skull base.  Skull: No osseous abnormality identified.  Sinuses/Orbits: Visualized paranasal sinuses and mastoids are stable and well pneumatized.  Other: Negative visualized orbit and scalp soft tissues.  ASPECTS [facility] Stroke Program Early CT Score)  Total score (0-10 with 10 being normal): 10.  IMPRESSION: 1. Stable and normal noncontrast CT appearance of the brain. 2. ASPECTS is 10. 3. Study discussed by telephone with Dr. Merlyn Lot on 07/23/2016 at 15:35 .   Electronically Signed   By: Genevie Ann M.D.   On: 07/23/2016 15:35    MR Brain Wo Contrast (Accession [id number]) (Order [id number])  Imaging  Date: 07/23/2016 Department: [facility] EMERGENCY DEPARTMENT Released By/Authorizing: Orbie Pyo, MD  (auto-released)  Exam Information   Status Exam Begun  Exam Ended   Final [99] 07/23/2016 4:57 PM 07/23/2016 5:26 PM  PACS Images   Show images for MR Brain Wo Contrast  Study Result   CLINICAL DATA:  Slurred speech this started yesterday. History of Bell's palsy.  EXAM: MRI HEAD WITHOUT CONTRAST  TECHNIQUE: Multiplanar, multiecho pulse sequences of the brain and surrounding structures were obtained without intravenous contrast.  COMPARISON:  07/22/2012  FINDINGS: Brain: No acute infarction, hemorrhage, hydrocephalus, extra-axial collection or mass lesion.  There is mild white matter disease with scattered FLAIR hyperintensities in the periventricular and deep regions. Given  differences in scan quality the pattern and extent is similar to 2013. This is usually from chronic microvascular disease, especially in the setting of hypertension and hyperlipidemia. Demyelination is considered given patient's age, but there is no specific demyelinating pattern or progression.  Vascular: Normal flow voids.  Skull and upper cervical spine: Normal marrow signal.  Sinuses/Orbits: Negative.  IMPRESSION: 1. No acute finding.  Negative for infarct. 2. Mild, nonspecific white matter disease without significant progression since 2013.   Electronically Signed   By: Monte Fantasia M.D.   On: 07/23/2016 17:46   US Venous Img Lower Unilateral Right (Accession [id number]) (Order [id number])  Imaging  Date: 07/23/2016 Department: [facility] EMERGENCY DEPARTMENT Released By/Authorizing: Orbie Pyo, MD (auto-released)  Exam Information   Status Exam Begun  Exam Ended   Final [99] 07/23/2016 5:39 PM 07/23/2016 6:05 PM  PACS Images   Show images for US Venous Img Lower Unilateral Right  Study Result   CLINICAL DATA:  55 year old female with right lower extremity swelling x3 days  EXAM: Right LOWER EXTREMITY VENOUS DOPPLER  ULTRASOUND  TECHNIQUE: Gray-scale sonography with graded compression, as well as color Doppler and duplex ultrasound were performed to evaluate the lower extremity deep venous systems from the level of the common femoral vein and including the common femoral, femoral, profunda femoral, popliteal and calf veins including the posterior tibial, peroneal and gastrocnemius veins when visible. The superficial great saphenous vein was also interrogated. Spectral Doppler was utilized to evaluate flow at rest and with distal augmentation maneuvers in the common femoral, femoral and popliteal veins.  COMPARISON:  Ultrasound dated 02/14/2015  FINDINGS: Contralateral Common Femoral Vein: Respiratory phasicity is normal and symmetric with the symptomatic side. No evidence of thrombus. Normal compressibility.  Common Femoral Vein: No evidence of thrombus. Normal compressibility, respiratory phasicity and response to augmentation.  Saphenofemoral Junction: No evidence of thrombus. Normal compressibility and flow on color Doppler imaging.  Profunda Femoral Vein: No evidence of thrombus. Normal compressibility and flow on color Doppler imaging.  Femoral Vein: No evidence of thrombus. Normal compressibility, respiratory phasicity and response to augmentation.  Popliteal Vein: No evidence of thrombus. Normal compressibility, respiratory phasicity and response to augmentation.  Calf Veins: No evidence of thrombus. Normal compressibility and flow on color Doppler imaging.  Superficial Great Saphenous Vein: No evidence of thrombus. Normal compressibility and flow on color Doppler imaging.  Venous Reflux:  None.  Other Findings: There is a 4.1 x 1.4 x 2.9 cm cystic structure in the right popliteal fossa, likely a Baker's cyst.  IMPRESSION: No evidence of deep venous thrombosis in the right lower extremity.  Probable right Baker cyst.   Electronically Signed   By: Anner Crete M.D.   On: 07/23/2016 18:42     ____________________________________________   PROCEDURES  Procedure(s) performed:   Procedures  Critical Care performed:   ____________________________________________   INITIAL IMPRESSION / ASSESSMENT AND PLAN / ED COURSE  Pertinent labs & imaging results that were available during my care of the patient were reviewed by me and considered in my medical decision making (see chart for details).  ----------------------------------------- 4:25 PM on 07/23/2016 -----------------------------------------  Case was discussed with Dr. Clovia Cuff of the specialist on-call neurology service who gets the impression that the patient's symptoms are psychogenic. He says that if the patient is able to follow-up next Wednesday days with a neurologist that she would be appropriate for discharge but otherwise would need an MRI and further workup. Patient and her  family member the bedside also say that she has difficulty with lifting her legs and that the motion deficits/strength deficits in her bilateral lower extremities are chronic.  Clinical Course   ----------------------------------------- 6:57 PM on 07/23/2016 -----------------------------------------  Patient with very reassuring workup including a negative MRI as well as DVT study of the right lower extremity. The specialist on-call neurologist recommended discussing the case with the Hospital neurology consultant. I discussed the case with Dr. Irish Elders of neurology who says that the patient should be appropriate for discharge to home and outpatient neurologic follow-up. I discussed this plan with the patient and she is understanding Malinda comply. I'll be giving her the phone number for neurology as an outpatient.  ____________________________________________   FINAL CLINICAL IMPRESSION(S) / ED DIAGNOSES  Stuttering.    NEW MEDICATIONS STARTED DURING THIS VISIT:  New Prescriptions    No medications on file     Note:  This document was prepared using Dragon voice recognition software and may include unintentional dictation errors.    Orbie Pyo, MD 07/23/16 [phone]

## 2016-07-23 NOTE — ED Triage Notes (Signed)
Pt with slurred speech that started yesterday at 1900. Pt with hx bells palsy

## 2016-07-23 NOTE — ED Notes (Signed)
SOC in progress.  

## 2016-07-27 LAB — URINE CULTURE: Culture: >=100000 — AB

## 2016-07-28 NOTE — Progress Notes (Signed)
ED Culture Results   Allergies: NKDA Visit Date: 07/23/16 Chief Complaint: aphasia Culture Type: urine Culture Results: >100,000 E. Coli and >100,000 Klebsiella pneuoniae, ESBL positive Original Abx given: Cephalexin  Original Abx sensitive, intermediate, or resistant: E.coli- resistant, K. pneumo- sensitive Recommended Abx: will not recommend another abx for ESBL E.coli, as this is most likely a chronic problem and pt did not present with signs or symptoms of UTI. Ok to not treat per Dr. Quentin Cornwall.  ED Physician: Dr. Bonna Gains Patient: No Prescription Called into: N/A  Loree Fee, PharmD 2:29 PM 07/28/2016

## 2016-08-03 ENCOUNTER — Emergency Department: Payer: Self-pay

## 2016-08-03 ENCOUNTER — Encounter: Payer: Self-pay | Admitting: Emergency Medicine

## 2016-08-03 ENCOUNTER — Emergency Department
Admission: EM | Admit: 2016-08-03 | Discharge: 2016-08-03 | Disposition: A | Discharge status: Home / Self Care | Payer: Self-pay | Attending: Emergency Medicine | Admitting: Emergency Medicine

## 2016-08-03 DIAGNOSIS — Z7982 Long term (current) use of aspirin: Secondary | ICD-10-CM | POA: Insufficient documentation

## 2016-08-03 DIAGNOSIS — F17210 Nicotine dependence, cigarettes, uncomplicated: Secondary | ICD-10-CM | POA: Insufficient documentation

## 2016-08-03 DIAGNOSIS — W19XXXA Unspecified fall, initial encounter: Secondary | ICD-10-CM

## 2016-08-03 DIAGNOSIS — Y929 Unspecified place or not applicable: Secondary | ICD-10-CM | POA: Insufficient documentation

## 2016-08-03 DIAGNOSIS — M79644 Pain in right finger(s): Secondary | ICD-10-CM | POA: Insufficient documentation

## 2016-08-03 DIAGNOSIS — W010XXA Fall on same level from slipping, tripping and stumbling without subsequent striking against object, initial encounter: Secondary | ICD-10-CM | POA: Insufficient documentation

## 2016-08-03 DIAGNOSIS — M7918 Myalgia, other site: Secondary | ICD-10-CM

## 2016-08-03 DIAGNOSIS — Z79899 Other long term (current) drug therapy: Secondary | ICD-10-CM | POA: Insufficient documentation

## 2016-08-03 DIAGNOSIS — Y999 Unspecified external cause status: Secondary | ICD-10-CM | POA: Insufficient documentation

## 2016-08-03 DIAGNOSIS — M1711 Unilateral primary osteoarthritis, right knee: Secondary | ICD-10-CM | POA: Insufficient documentation

## 2016-08-03 DIAGNOSIS — M25551 Pain in right hip: Secondary | ICD-10-CM | POA: Insufficient documentation

## 2016-08-03 DIAGNOSIS — Y9301 Activity, walking, marching and hiking: Secondary | ICD-10-CM | POA: Insufficient documentation

## 2016-08-03 DIAGNOSIS — I10 Essential (primary) hypertension: Secondary | ICD-10-CM | POA: Insufficient documentation

## 2016-08-03 MED ORDER — NAPROXEN 500 MG PO TABS: 500.0000 mg | ORAL_TABLET | Freq: Two times a day (BID) | ORAL | 0 refills | Status: DC

## 2016-08-03 MED ORDER — KETOROLAC TROMETHAMINE 60 MG/2ML IM SOLN
15.0000 mg | Freq: Once | INTRAMUSCULAR | Status: AC
  Administered 2016-08-03: 15 mg via INTRAMUSCULAR
  Filled 2016-08-03: qty 2

## 2016-08-03 NOTE — ED Provider Notes (Signed)
[facility] Emergency Department Provider Note  ____________________________________________  Time seen: Approximately 9:56 AM  I have reviewed the triage vital signs and the nursing notes.   HISTORY  Chief Complaint Fall    HPI TALEIGH SEPE is a 55 y.o. female who complains of pain in the right hip, right index finger, and right knee after multiple falls while chasing her grandchild through the woods in a rainstorm. She reports that she slipped and fell multiple times and mod puddles and has had pain since then. No head injury or loss of consciousness. No neck pain, numbness [place] or weakness.     Past Medical History:  Diagnosis Date  . Bell's palsy   . GERD (gastroesophageal reflux disease)   . Hyperlipidemia   . Hypertension   . Morbid obesity with BMI of 40.0-44.9, adult [facility])      Patient Active Problem List   Diagnosis Date Noted  . Chest pain 08/06/2012     Past Surgical History:  Procedure Laterality Date  . CHOLECYSTECTOMY    . VAGINAL HYSTERECTOMY       Prior to Admission medications   Medication Sig Start Date End Date Taking? Authorizing Provider  aspirin EC 81 MG tablet Take 81 mg by mouth daily.   Yes Historical Provider, MD  atorvastatin (LIPITOR) 40 MG tablet Take 40 mg by mouth at bedtime.    Yes Historical Provider, MD  citalopram (CELEXA) 20 MG tablet Take 20 mg by mouth daily. Reported on 01/24/2016   Yes Historical Provider, MD  metoprolol tartrate (LOPRESSOR) 25 MG tablet Take 25 mg by mouth 2 (two) times daily.   Yes Historical Provider, MD  naproxen (NAPROSYN) 500 MG tablet Take 1 tablet (500 mg total) by mouth 2 (two) times daily with a meal. 08/03/16   Carrie Mew, MD     Allergies Review of patient's allergies indicates no known allergies.   Family History  Problem Relation Age of Onset  . Hypertension Mother   . Heart disease Mother   . Heart attack Mother   . Breast cancer Mother   .  Hypertension Father   . Breast cancer Paternal Aunt     Social History Social History  Substance Use Topics  . Smoking status: Current Some Day Smoker    Packs/day: 0.50    Types: Cigarettes  . Smokeless tobacco: Never Used  . Alcohol use No    Review of Systems  Constitutional:   No fever or chills.  Cardiovascular:   No chest pain. Respiratory:   No dyspnea or cough. Gastrointestinal:   Negative for abdominal pain, vomiting and diarrhea.  Musculoskeletal:   Multiple muscular skeletal pain complaints as above Neurological:   Negative for headaches 10-point ROS otherwise negative.  ____________________________________________   PHYSICAL EXAM:  VITAL SIGNS: ED Triage Vitals  Enc Vitals Group     BP 08/03/16 0850 (!) 179/111     Pulse Rate 08/03/16 0850 (!) 118     Resp 08/03/16 0850 20     Temp 08/03/16 0850 97.9 F (36.6 C)     Temp Source 08/03/16 0850 Oral     SpO2 08/03/16 0850 99 %     Weight 08/03/16 0851 250 lb (113.4 kg)     Height 08/03/16 0851 5\' 6"  (1.676 m)     Head Circumference --      Peak Flow --      Pain Score 08/03/16 0851 10     Pain Loc --  Pain Edu? --      Excl. in [place]? --     Vital signs reviewed, nursing assessments reviewed.   Constitutional:   Alert and oriented. Well appearing and in no distress. Eyes:   No scleral icterus. No conjunctival pallor. PERRL. EOMI.  No nystagmus. ENT   Head:   Normocephalic and atraumatic.   Nose:   No congestion/rhinnorhea. No septal hematoma   Mouth/Throat:   MMM, no pharyngeal erythema. No peritonsillar mass.    Neck:   No stridor. No SubQ emphysema. No meningismus. Hematological/Lymphatic/Immunilogical:   No cervical lymphadenopathy. Cardiovascular:   RRR. Symmetric bilateral radial and DP pulses.  No murmurs.  Respiratory:   Normal respiratory effort without tachypnea nor retractions. Breath sounds are clear and equal bilaterally. No wheezes/rales/rhonchi. Gastrointestinal:   Soft  Without focal tenderness. Non distended. There is no CVA tenderness.  No rebound, rigidity, or guarding. Genitourinary:   deferred Musculoskeletal:   Diffuse tenderness with palpation throughout the entire body.. This includes tenderness over the indicated area of injury in the right index finger, right knee, and lower back. No focal midline spinal tenderness. Full range of motion in joints, no focal bony tenderness. No deformities or step-offs. Neurologic:   Normal speech and language.  CN 2-10 normal. Motor grossly intact. No gross focal neurologic deficits are appreciated.  Skin:    Skin is warm, dry and intact. No rash noted.  No petechiae, purpura, or bullae.  ____________________________________________    LABS (pertinent positives/negatives) (all labs ordered are listed, but only abnormal results are displayed) Labs Reviewed - No data to display ____________________________________________   EKG    ____________________________________________    RADIOLOGY  X-ray L-spine unremarkable X-ray pelvis unremarkable X-ray right knee unremarkable X-ray left index finger unremarkable  ____________________________________________   PROCEDURES Procedures  ____________________________________________   INITIAL IMPRESSION / ASSESSMENT AND PLAN / ED COURSE  Pertinent labs & imaging results that were available during my care of the patient were reviewed by me and considered in my medical decision making (see chart for details).  Patient presents with pain in right index finger, lower back, and right knee after multiple mechanical falls tried to walk to the [place] and mod puddles in the rain last night.  Given the patient's history of depression, unreliable physical exam with pain elicited everywhere that I palpate including places where she reports no pain, and her history of frequent controlled substance prescriptions over the past year as obtained from the [place]  controlled substance reporting system, it appears the patient has a very low pain tolerance, hurting physical exam is unreliable, and cell get x-rays of all the areas where she reports pain. Vital low suspicion for acute fracture or other serious traumatic injury and think that this is all musculoskeletal pain and contusions. Intramuscular Toradol for pain control.     Clinical Course    ----------------------------------------- 11:08 AM on 08/03/2016 -----------------------------------------  X-rays negative. A trace patient had a tachycardia of about 118, on exam this is not present and she has regular rate and rhythm. Vital signs stable. Low suspicion for internal hemorrhage or shock or infection or sepsis. ____________________________________________   FINAL CLINICAL IMPRESSION(S) / ED DIAGNOSES  Final diagnoses:  Fall, initial encounter  Osteoarthritis of right knee, unspecified osteoarthritis type  Musculoskeletal pain       Portions of this note were generated with dragon dictation software. Dictation errors may occur despite best attempts at proofreading.    Carrie Mew, MD 08/03/16 [phone]

## 2016-08-03 NOTE — ED Triage Notes (Signed)
Reports walking thru the woods last night to find her grandson and slipped in the mud several times. C/o right side rib pain, right knee pain and tailbone pain.

## 2016-08-03 NOTE — ED Notes (Signed)
Pt to xray

## 2016-08-06 ENCOUNTER — Emergency Department: Payer: Self-pay

## 2016-08-06 ENCOUNTER — Emergency Department
Admission: EM | Admit: 2016-08-06 | Discharge: 2016-08-06 | Disposition: A | Discharge status: Home / Self Care | Payer: Self-pay | Attending: Emergency Medicine | Admitting: Emergency Medicine

## 2016-08-06 DIAGNOSIS — Z7982 Long term (current) use of aspirin: Secondary | ICD-10-CM | POA: Insufficient documentation

## 2016-08-06 DIAGNOSIS — W010XXA Fall on same level from slipping, tripping and stumbling without subsequent striking against object, initial encounter: Secondary | ICD-10-CM | POA: Insufficient documentation

## 2016-08-06 DIAGNOSIS — Y999 Unspecified external cause status: Secondary | ICD-10-CM | POA: Insufficient documentation

## 2016-08-06 DIAGNOSIS — S20211A Contusion of right front wall of thorax, initial encounter: Secondary | ICD-10-CM | POA: Insufficient documentation

## 2016-08-06 DIAGNOSIS — Y929 Unspecified place or not applicable: Secondary | ICD-10-CM | POA: Insufficient documentation

## 2016-08-06 DIAGNOSIS — Z79899 Other long term (current) drug therapy: Secondary | ICD-10-CM | POA: Insufficient documentation

## 2016-08-06 DIAGNOSIS — I10 Essential (primary) hypertension: Secondary | ICD-10-CM | POA: Insufficient documentation

## 2016-08-06 DIAGNOSIS — Y939 Activity, unspecified: Secondary | ICD-10-CM | POA: Insufficient documentation

## 2016-08-06 DIAGNOSIS — R0789 Other chest pain: Secondary | ICD-10-CM

## 2016-08-06 DIAGNOSIS — F17210 Nicotine dependence, cigarettes, uncomplicated: Secondary | ICD-10-CM | POA: Insufficient documentation

## 2016-08-06 MED ORDER — ORPHENADRINE CITRATE 30 MG/ML IJ SOLN
60.0000 mg | INTRAMUSCULAR | Status: AC
  Administered 2016-08-06: 60 mg via INTRAMUSCULAR
  Filled 2016-08-06: qty 2

## 2016-08-06 MED ORDER — CYCLOBENZAPRINE HCL 5 MG PO TABS: 5.0000 mg | ORAL_TABLET | Freq: Three times a day (TID) | ORAL | 0 refills | Status: DC | PRN

## 2016-08-06 MED ORDER — KETOROLAC TROMETHAMINE 10 MG PO TABS: 10.0000 mg | ORAL_TABLET | Freq: Three times a day (TID) | ORAL | 0 refills | Status: DC

## 2016-08-06 MED ORDER — KETOROLAC TROMETHAMINE 30 MG/ML IJ SOLN
30.0000 mg | Freq: Once | INTRAMUSCULAR | Status: AC
  Administered 2016-08-06: 30 mg via INTRAMUSCULAR

## 2016-08-06 MED ORDER — KETOROLAC TROMETHAMINE 30 MG/ML IJ SOLN
30.0000 mg | Freq: Once | INTRAMUSCULAR | Status: DC
  Filled 2016-08-06: qty 1

## 2016-08-06 NOTE — ED Provider Notes (Signed)
[facility] Emergency Department Provider Note ____________________________________________  Time seen: 1555  I have reviewed the triage vital signs and the nursing notes.  HISTORY  Chief Complaint  Rib Injury  HPI Carla [name] is a 55 y.o. female reports continued right rib pain after as slip and fall last week. She was evaluated here the day after the accident. She was not evaluated for any rib injury at the time, but had multiple x-rays for other complaints. She reports pain to the right ribs with deep inspiration and transitioning from sit to stand.   Past Medical History:  Diagnosis Date  . Bell's palsy   . GERD (gastroesophageal reflux disease)   . Hyperlipidemia   . Hypertension   . Morbid obesity with BMI of 40.0-44.9, adult [facility])     Patient Active Problem List   Diagnosis Date Noted  . Chest pain 08/06/2012    Past Surgical History:  Procedure Laterality Date  . CHOLECYSTECTOMY    . VAGINAL HYSTERECTOMY      Prior to Admission medications   Medication Sig Start Date End Date Taking? Authorizing Provider  aspirin EC 81 MG tablet Take 81 mg by mouth daily.    Historical Provider, MD  atorvastatin (LIPITOR) 40 MG tablet Take 40 mg by mouth at bedtime.     Historical Provider, MD  citalopram (CELEXA) 20 MG tablet Take 20 mg by mouth daily. Reported on 01/24/2016    Historical Provider, MD  cyclobenzaprine (FLEXERIL) 5 MG tablet Take 1 tablet (5 mg total) by mouth 3 (three) times daily as needed for muscle spasms. 08/06/16   Rajendra Spiller V Bacon Allisson Schindel, PA-C  ketorolac (TORADOL) 10 MG tablet Take 1 tablet (10 mg total) by mouth every 8 (eight) hours. 08/06/16   Ahamed Hofland V Bacon Maxime Beckner, PA-C  metoprolol tartrate (LOPRESSOR) 25 MG tablet Take 25 mg by mouth 2 (two) times daily.    Historical Provider, MD  naproxen (NAPROSYN) 500 MG tablet Take 1 tablet (500 mg total) by mouth 2 (two) times daily with a meal. 08/03/16   Carrie Mew, MD     Allergies Review of patient's allergies indicates no known allergies.  Family History  Problem Relation Age of Onset  . Hypertension Mother   . Heart disease Mother   . Heart attack Mother   . Breast cancer Mother   . Hypertension Father   . Breast cancer Paternal Aunt     Social History Social History  Substance Use Topics  . Smoking status: Current Some Day Smoker    Packs/day: 0.50    Types: Cigarettes  . Smokeless tobacco: Never Used  . Alcohol use No    Review of Systems  Constitutional: Negative for fever. Cardiovascular: Negative for chest pain. Respiratory: Negative for shortness of breath. Gastrointestinal: Negative for abdominal pain, vomiting and diarrhea. Musculoskeletal: Reports right chest wall pain as above. Negative for back pain. Skin: Negative for rash. Neurological: Negative for headaches, focal weakness or numbness. ____________________________________________  PHYSICAL EXAM:  VITAL SIGNS: ED Triage Vitals  Enc Vitals Group     BP 08/06/16 1512 (!) 144/89     Pulse Rate 08/06/16 1511 93     Resp 08/06/16 1511 18     Temp 08/06/16 1511 98.3 F (36.8 C)     Temp Source 08/06/16 1511 Oral     SpO2 08/06/16 1511 98 %     Weight 08/06/16 1511 230 lb (104.3 kg)     Height 08/06/16 1511 5\' 3"  (1.6 m)  Head Circumference --      Peak Flow --      Pain Score 08/06/16 1511 10     Pain Loc --      Pain Edu? --      Excl. in [place]? --    Constitutional: Alert and oriented. Well appearing and in no distress. Head: Normocephalic and atraumatic. Cardiovascular: Normal rate, regular rhythm. Normal distal pulses. Respiratory: Normal respiratory effort. No wheezes/rales/rhonchi. Gastrointestinal: Soft and nontender. No distention. Musculoskeletal: Chest wall without any obvious deformity, ecchymosis, bruise or other injury. Patient localizes pain to the lateral aspect of the lower right ribs. Nontender with normal range of motion in all extremities.   Neurologic:  Normal gait without ataxia. Normal speech and language. No gross focal neurologic deficits are appreciated. Skin:  Skin is warm, dry and intact. No rash noted. ____________________________________________  PROCEDURES  Toradol 30 mg IM Norflex 60 mg IM ____________________________________________  INITIAL IMPRESSION / ASSESSMENT AND PLAN / ED COURSE  Patient with anterior chest wall pain and right rib contusion without evidence of fracture or dislocation. She is discharged with prescriptions for Flexeril and ketorolac to dose addition to her regularly prescribed tramadol. She will apply ice compresses and follow with primary care provider for ongoing symptom management.  Clinical Course   ____________________________________________  FINAL CLINICAL IMPRESSION(S) / ED DIAGNOSES  Final diagnoses:  Anterior chest wall pain  Rib contusion, right, initial encounter      Melvenia Needles, PA-C 08/06/16 1733    Delman Kitten, MD 08/06/16 2201

## 2016-08-06 NOTE — ED Triage Notes (Signed)
Pt states she slipped and fell on Monday and has been having right rib pain since, states she was seen here for it but they did not xray her ribs.

## 2016-08-06 NOTE — ED Notes (Signed)
See triage note  States she slipped and fell  Was seen the day after the fall  But now having pain to right rib area

## 2016-08-06 NOTE — Discharge Instructions (Signed)
Your x-ray did not reveal any rib fractures. Take the prescription meds as directed. Apply ice or cool compresses to reduce pain and swelling. Follow-up with [facility] for continued symptoms. Take you home Tramadol for additional pain relief.

## 2016-09-13 DIAGNOSIS — G894 Chronic pain syndrome: Secondary | ICD-10-CM | POA: Insufficient documentation

## 2016-09-20 IMAGING — CR DG HIP (WITH OR WITHOUT PELVIS) 2-3V*L*
1 series · 3 of 3 positions shown · non-contrast
Comparison: None.

CLINICAL DATA: Acute left hip pain

EXAM:
LEFT HIP (WITH PELVIS) 2-3 VIEWS

[Series 1: dg hip unilat with pelvis 2-3 views left · 0.14mm/px · 3 of 3 slices shown]
[im 1/3]
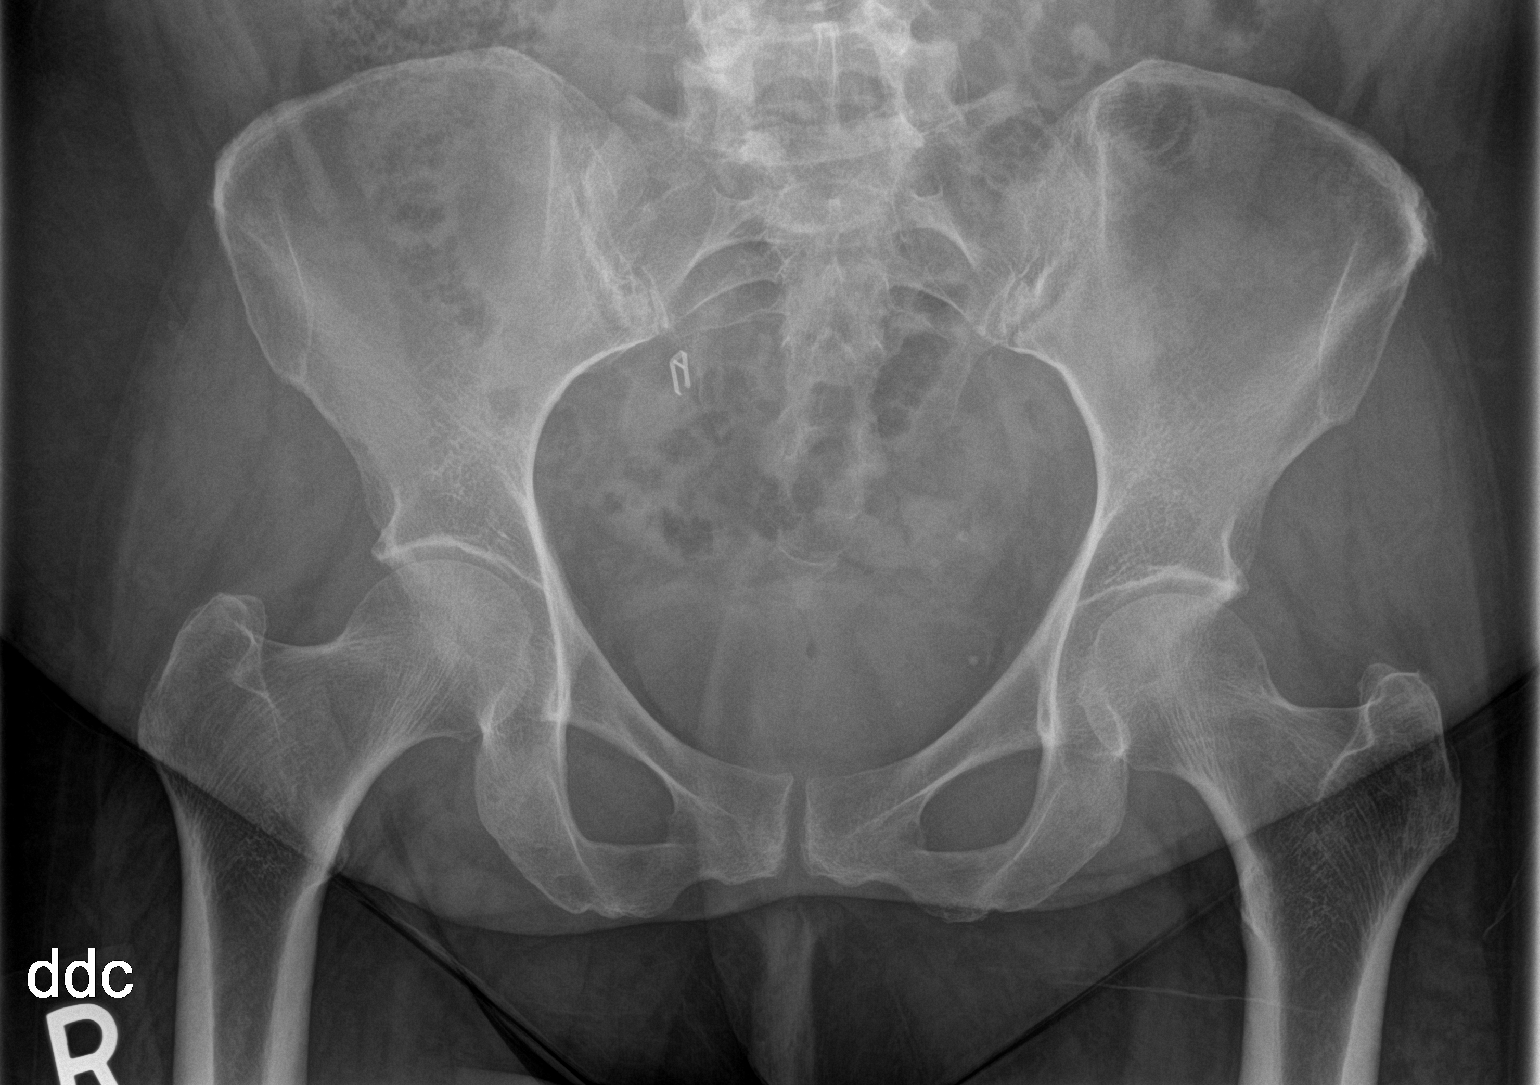
[im 2/3]
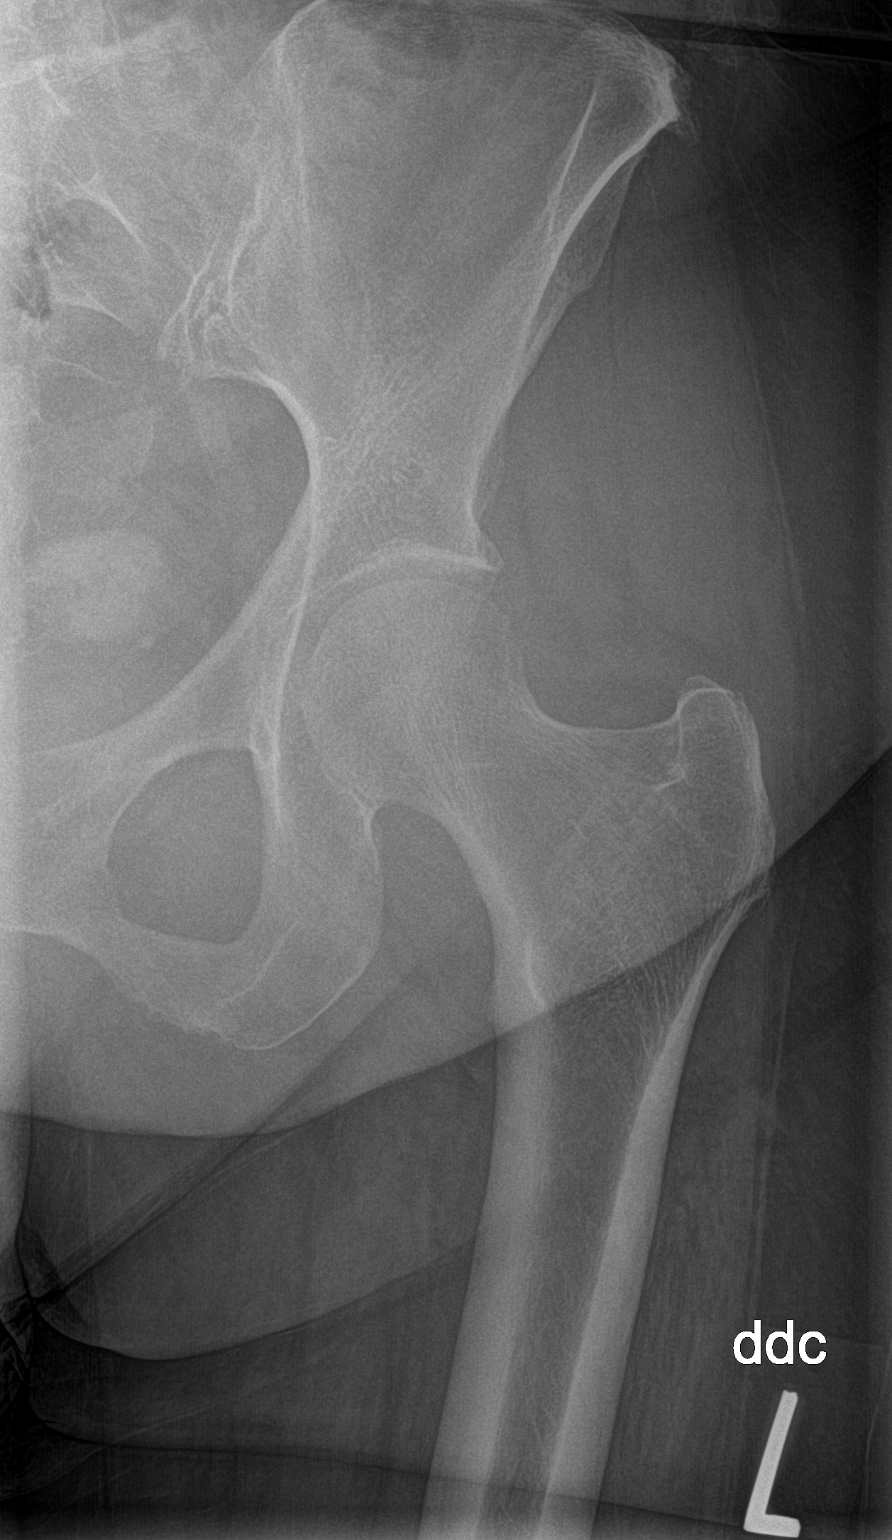
[im 3/3]
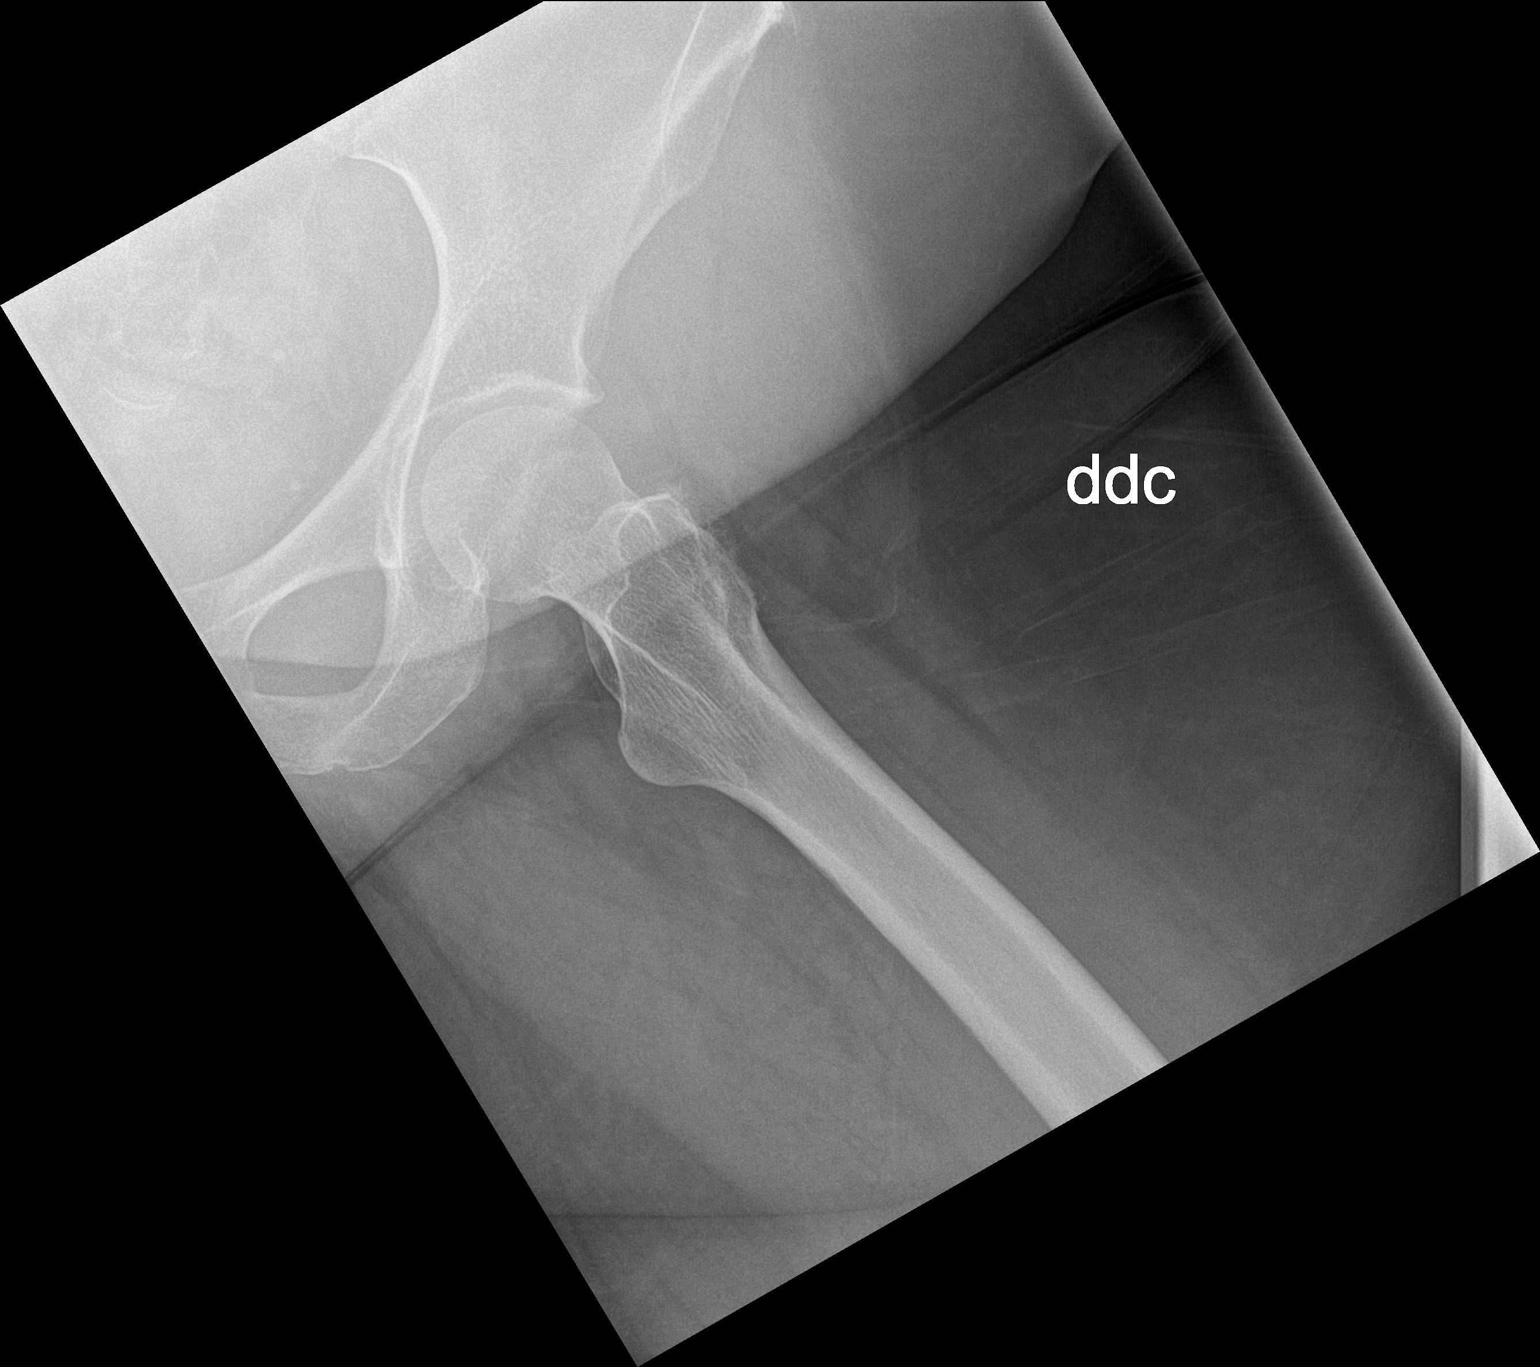

[3 of 3 positions shown; findings below may reference images not displayed]

FINDINGS: There is no evidence of hip fracture or dislocation. There is no
evidence of arthropathy or other focal bone abnormality.
IMPRESSION: Negative.

## 2016-09-22 IMAGING — CT CT RENAL STONE PROTOCOL
1 of 2 series · 15 of 32 positions shown, 19 images · non-contrast
Comparison: 01/19/2015

CLINICAL DATA: Left-sided abdominal and pelvic pain beginning 2
hours ago, nausea and vomiting

EXAM:
CT ABDOMEN AND PELVIS WITHOUT CONTRAST
TECHNIQUE: Multidetector CT imaging of the abdomen and pelvis was performed
following the standard protocol without IV contrast.

[Series 2: stone standard full · axial · 0.74mm/px · z∈[-492,-57]mm · 15 of 95 slices shown, 19 images]
[im 4/95  soft-tissue]
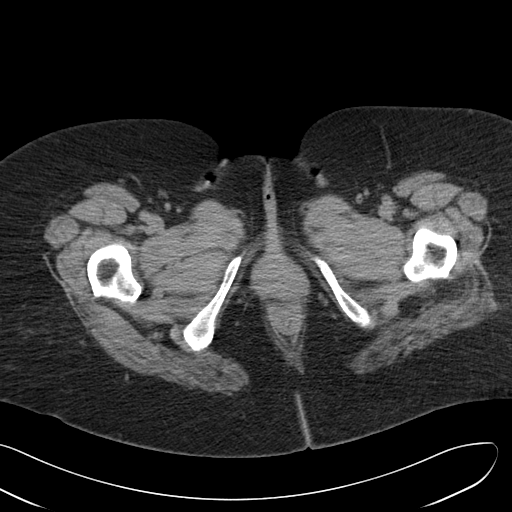
[im 4/95  bone]
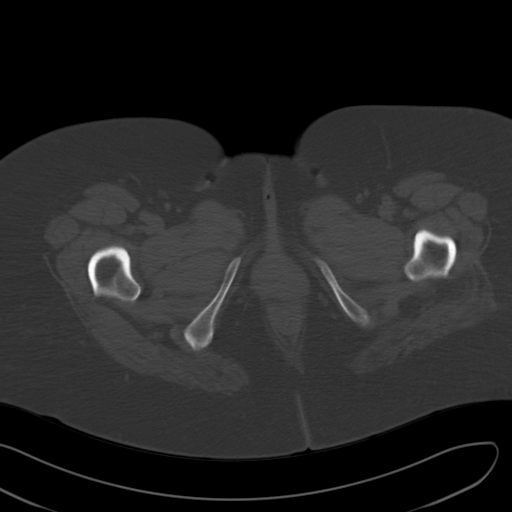
[im 12/95  soft-tissue]
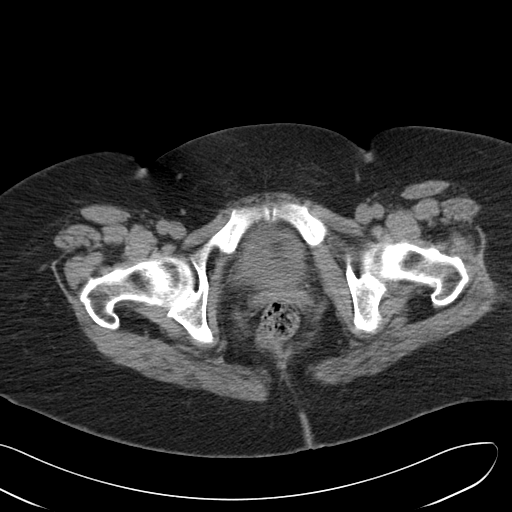
[im 19/95  soft-tissue]
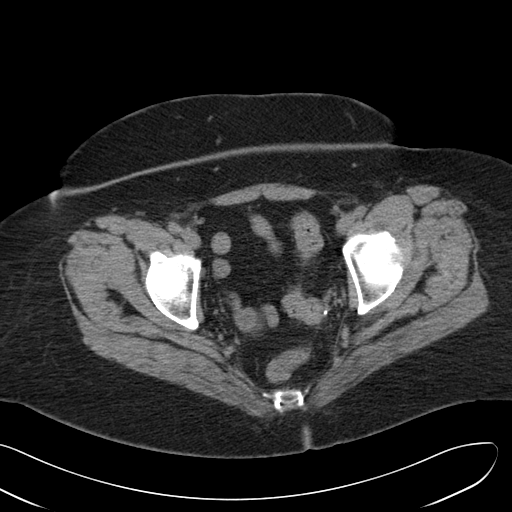
[im 27/95  soft-tissue]
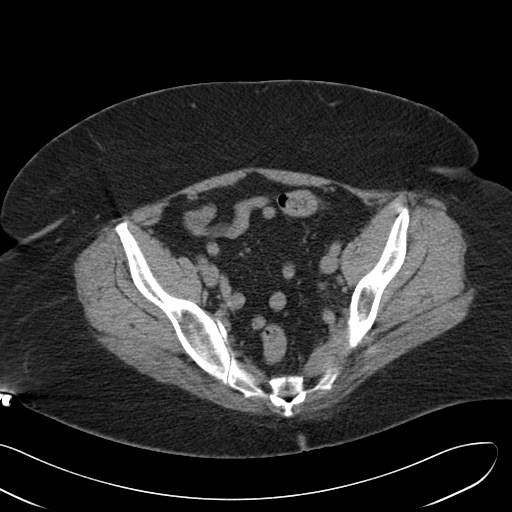
[im 34/95  soft-tissue]
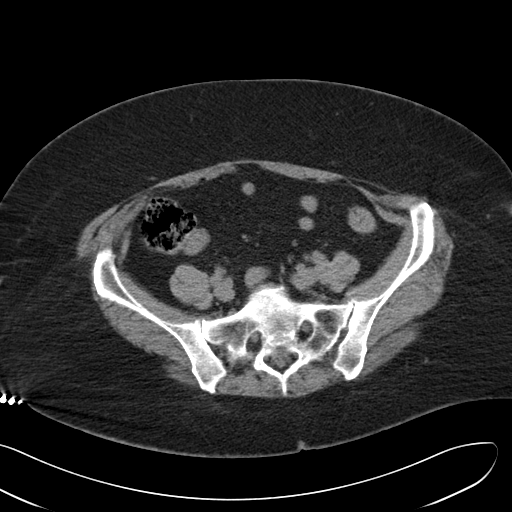
[im 42/95  soft-tissue]
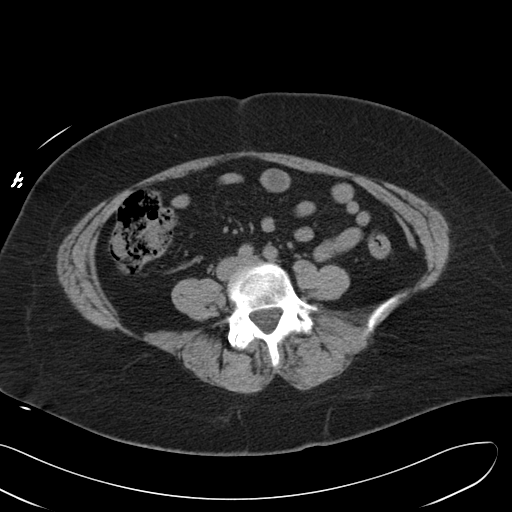
[im 49/95  soft-tissue]
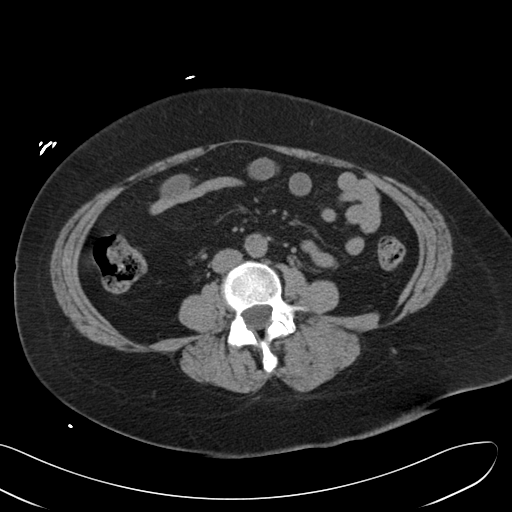
[im 53/95  soft-tissue]
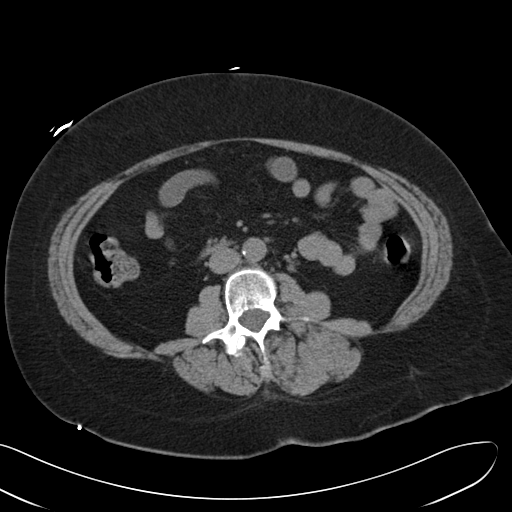
[im 61/95  soft-tissue]
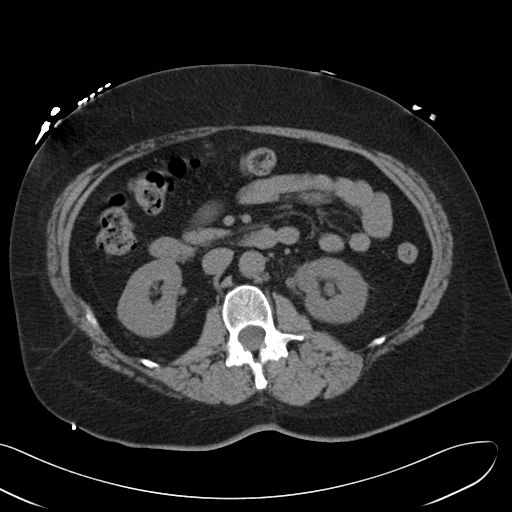
[im 61/95  bone]
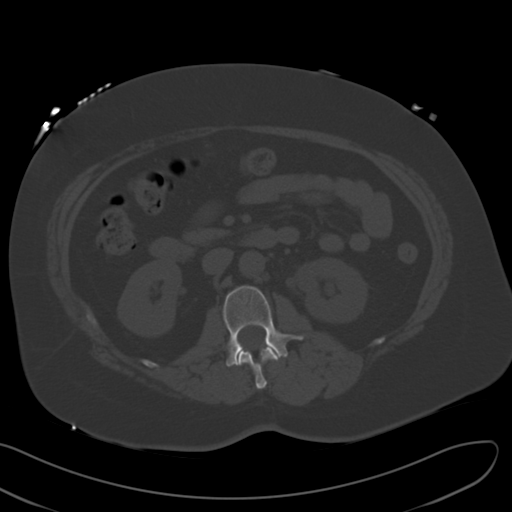
[im 68/95  soft-tissue]
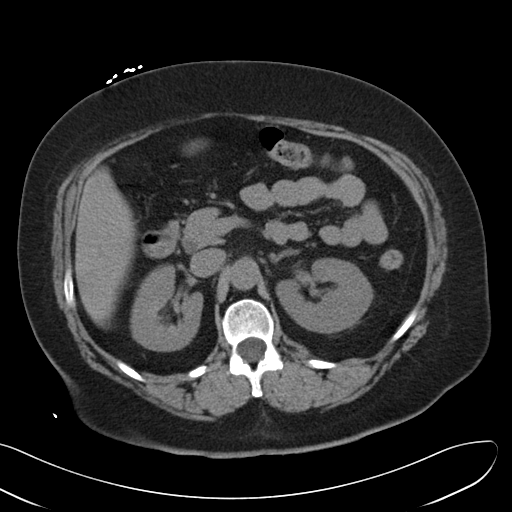
[im 76/95  soft-tissue]
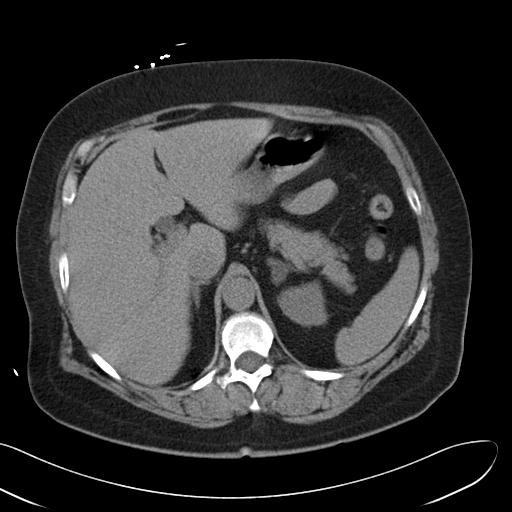
[im 79/95  lung]
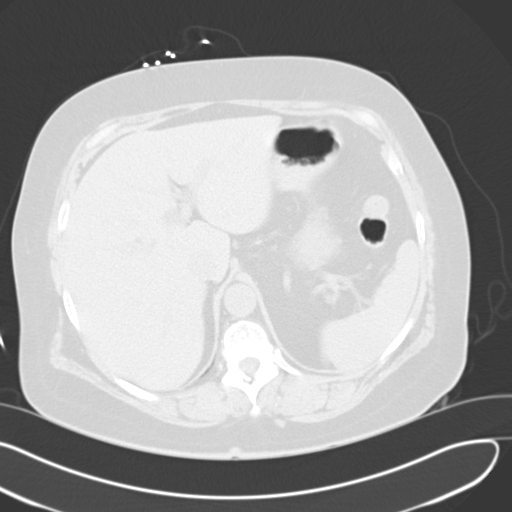
[im 83/95  soft-tissue]
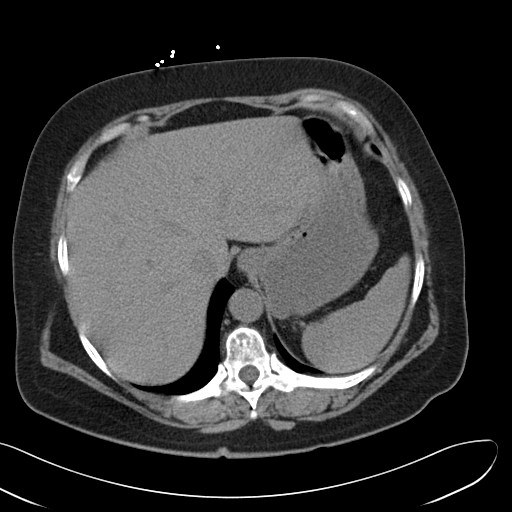
[im 83/95  lung]
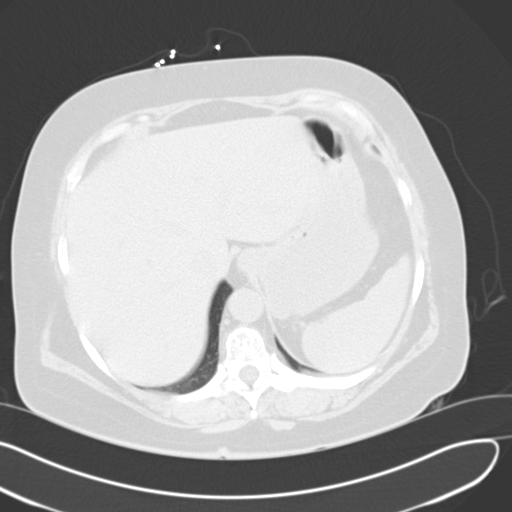
[im 87/95  lung]
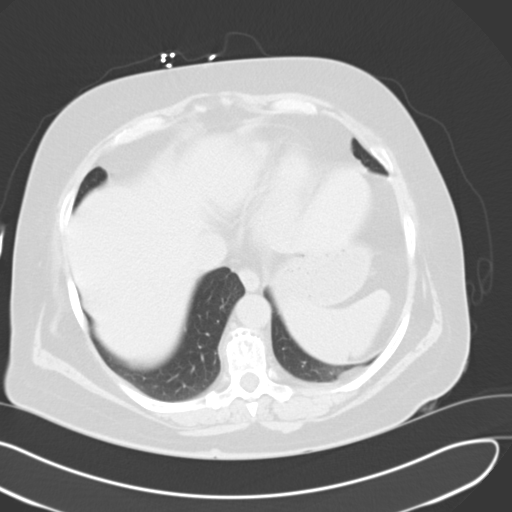
[im 91/95  soft-tissue]
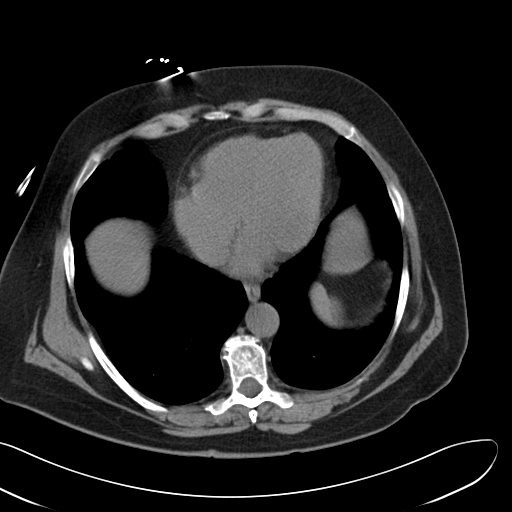
[im 91/95  lung]
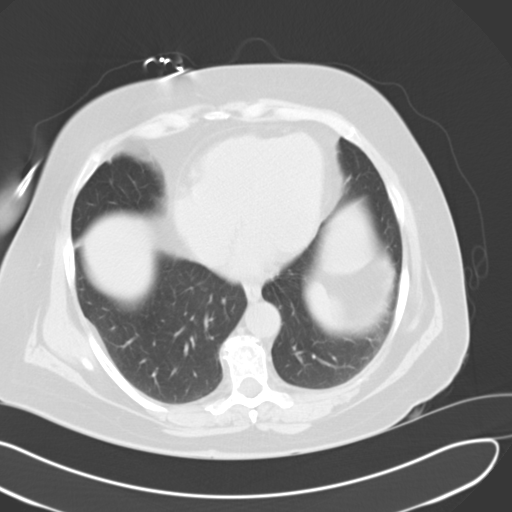

[15 of 32 positions shown; findings below may reference images not displayed]

FINDINGS: Lower chest:  Lung bases are clear.

Hepatobiliary: Cholecystectomy clips are noted. Unenhanced liver is
unremarkable.

Pancreas: Normal

Spleen: Normal

Adrenals/Urinary Tract: Subjective nodularity of both adrenal
glands, left greater than right, without measurable mass. This most
likely indicates adrenal hyperplasia and is stable. There is new
moderate left hydroureteronephrosis to the level of a 3 mm proximal
left ureteral calculus image 40, with mild very ureteral stranding.
Distal ureteral decompression is identified. The bladder is
unremarkable. No right renal or ureteral radiopaque calculus.

Stomach/Bowel: No bowel wall thickening or focal segmental
dilatation is identified.

Vascular/Lymphatic: Mild atheromatous aortic calcification without
aneurysm. No lymphadenopathy.

Other: Right ovary appears unremarkable, with tubal ligation clips
in place. Left ovary and uterus presumed surgically absent.

No free air or fluid.

Musculoskeletal: No acute osseous abnormality. Mild lower lumbar
spine disc degenerative change.
IMPRESSION: 3 mm proximal left ureteral calculus producing moderate left
hydroureteronephrosis.

## 2016-10-25 ENCOUNTER — Emergency Department
Admission: EM | Admit: 2016-10-25 | Discharge: 2016-10-25 | Disposition: A | Discharge status: Home / Self Care | Payer: Self-pay | Attending: Emergency Medicine | Admitting: Emergency Medicine

## 2016-10-25 DIAGNOSIS — Z791 Long term (current) use of non-steroidal anti-inflammatories (NSAID): Secondary | ICD-10-CM | POA: Insufficient documentation

## 2016-10-25 DIAGNOSIS — Z79899 Other long term (current) drug therapy: Secondary | ICD-10-CM | POA: Insufficient documentation

## 2016-10-25 DIAGNOSIS — M545 Low back pain, unspecified: Secondary | ICD-10-CM

## 2016-10-25 DIAGNOSIS — N3000 Acute cystitis without hematuria: Secondary | ICD-10-CM | POA: Insufficient documentation

## 2016-10-25 DIAGNOSIS — F17210 Nicotine dependence, cigarettes, uncomplicated: Secondary | ICD-10-CM | POA: Insufficient documentation

## 2016-10-25 DIAGNOSIS — J069 Acute upper respiratory infection, unspecified: Secondary | ICD-10-CM | POA: Insufficient documentation

## 2016-10-25 DIAGNOSIS — I10 Essential (primary) hypertension: Secondary | ICD-10-CM | POA: Insufficient documentation

## 2016-10-25 DIAGNOSIS — Z7982 Long term (current) use of aspirin: Secondary | ICD-10-CM | POA: Insufficient documentation

## 2016-10-25 DIAGNOSIS — B9789 Other viral agents as the cause of diseases classified elsewhere: Secondary | ICD-10-CM

## 2016-10-25 DIAGNOSIS — G8929 Other chronic pain: Secondary | ICD-10-CM | POA: Insufficient documentation

## 2016-10-25 LAB — URINALYSIS, COMPLETE (UACMP) WITH MICROSCOPIC
Bilirubin Urine: NEGATIVE
Glucose, UA: NEGATIVE mg/dL
Ketones, ur: NEGATIVE mg/dL
Nitrite: NEGATIVE
Protein, ur: 30 mg/dL — AB
Specific Gravity, Urine: 1.043 — ABNORMAL HIGH (ref 1.005–1.030)
pH: 5 (ref 5.0–8.0)

## 2016-10-25 MED ORDER — SULFAMETHOXAZOLE-TRIMETHOPRIM 800-160 MG PO TABS: 1.0000 | ORAL_TABLET | Freq: Two times a day (BID) | ORAL | 0 refills | Status: AC

## 2016-10-25 MED ORDER — DICLOFENAC SODIUM 50 MG PO TBEC: 50.0000 mg | DELAYED_RELEASE_TABLET | Freq: Two times a day (BID) | ORAL | 0 refills | Status: DC

## 2016-10-25 MED ORDER — DICLOFENAC SODIUM 75 MG PO TBEC
75.0000 mg | DELAYED_RELEASE_TABLET | Freq: Once | ORAL | Status: DC
  Filled 2016-10-25: qty 1

## 2016-10-25 NOTE — ED Notes (Addendum)
Pt c/o urinary freq and pain after voiding.  Sts that pain been going on "a while' but worse last 2 days.

## 2016-10-25 NOTE — ED Triage Notes (Signed)
Pt c/o URI sx  X 2  Days and extreme pain around urethra after urination X 1 month. Pt alert and oriented X4, active, cooperative, pt in NAD. RR even and unlabored, color WNL.

## 2016-10-25 NOTE — ED Provider Notes (Signed)
[facility] Emergency Department Provider Note ____________________________________________  Time seen: 1601  I have reviewed the triage vital signs and the nursing notes.  HISTORY  Chief Complaint  URI; Dysuria; and Cough  HPI Carla [name] is a 56 y.o. female who is well-known to this ED. She presentstoday with complaints of urinary frequency that has been intermittent for the last month. She reports that they have been worse over the last 2 days. She describes urinary frequency and pain after voiding. Denies any vaginal discharge, abnormal vaginal bleeding, or pelvic pain. She also has complaint of some cough and cold symptoms including chills, sneezing, and nonoperative cough. She is not taking any medications over-the-counter for symptom relief. She denies any frank fevers, nausea, vomiting, or diarrhea. She does note ongoing complaints of her chronic back pain.  Past Medical History:  Diagnosis Date  . Bell's palsy   . GERD (gastroesophageal reflux disease)   . Hyperlipidemia   . Hypertension   . Morbid obesity with BMI of 40.0-44.9, adult [facility])     Patient Active Problem List   Diagnosis Date Noted  . Chest pain 08/06/2012    Past Surgical History:  Procedure Laterality Date  . CHOLECYSTECTOMY    . VAGINAL HYSTERECTOMY      Prior to Admission medications   Medication Sig Start Date End Date Taking? Authorizing Provider  aspirin EC 81 MG tablet Take 81 mg by mouth daily.    Historical Provider, MD  atorvastatin (LIPITOR) 40 MG tablet Take 40 mg by mouth at bedtime.     Historical Provider, MD  citalopram (CELEXA) 20 MG tablet Take 20 mg by mouth daily. Reported on 01/24/2016    Historical Provider, MD  cyclobenzaprine (FLEXERIL) 5 MG tablet Take 1 tablet (5 mg total) by mouth 3 (three) times daily as needed for muscle spasms. 08/06/16   Koron Godeaux V Bacon Terissa Haffey, PA-C  diclofenac (VOLTAREN) 50 MG EC tablet Take 1 tablet (50 mg total) by mouth 2 (two)  times daily. 10/25/16   Charron Coultas V Bacon Javarious Elsayed, PA-C  ketorolac (TORADOL) 10 MG tablet Take 1 tablet (10 mg total) by mouth every 8 (eight) hours. 08/06/16   Wynona Duhamel V Bacon Asahel Risden, PA-C  metoprolol tartrate (LOPRESSOR) 25 MG tablet Take 25 mg by mouth 2 (two) times daily.    Historical Provider, MD  naproxen (NAPROSYN) 500 MG tablet Take 1 tablet (500 mg total) by mouth 2 (two) times daily with a meal. 08/03/16   Carrie Mew, MD  sulfamethoxazole-trimethoprim (BACTRIM DS,SEPTRA DS) 800-160 MG tablet Take 1 tablet by mouth 2 (two) times daily. 10/25/16 10/30/16  Dannielle Karvonen Niara Bunker, PA-C    Allergies Patient has no known allergies.  Family History  Problem Relation Age of Onset  . Hypertension Mother   . Heart disease Mother   . Heart attack Mother   . Breast cancer Mother   . Hypertension Father   . Breast cancer Paternal Aunt     Social History Social History  Substance Use Topics  . Smoking status: Current Some Day Smoker    Packs/day: 0.50    Types: Cigarettes  . Smokeless tobacco: Never Used  . Alcohol use No    Review of Systems  Constitutional: Negative for fever. Eyes: Negative for visual changes. ENT: Negative for sore throat. Cardiovascular: Negative for chest pain. Respiratory: Negative for shortness of breath. Reports cough. Gastrointestinal: Negative for abdominal pain, vomiting and diarrhea. Genitourinary: Positive for dysuria. Musculoskeletal: Positive for back pain. Neurological: Negative for  headaches, focal weakness or numbness. ____________________________________________  PHYSICAL EXAM:  VITAL SIGNS: ED Triage Vitals  Enc Vitals Group     BP 10/25/16 1528 (!) 144/104     Pulse Rate 10/25/16 1526 (!) 103     Resp 10/25/16 1526 16     Temp 10/25/16 1526 97.6 F (36.4 C)     Temp Source 10/25/16 1526 Oral     SpO2 10/25/16 1526 99 %     Weight 10/25/16 1527 235 lb (106.6 kg)     Height 10/25/16 1527 5\' 3"  (1.6 m)     Head Circumference --       Peak Flow --      Pain Score 10/25/16 1527 10     Pain Loc --      Pain Edu? --      Excl. in [place]? --    Constitutional: Alert and oriented. Well appearing and in no distress. Head: Normocephalic and atraumatic. Eyes: Conjunctivae are normal. PERRL. Normal extraocular movements Ears: Canals clear. TMs intact bilaterally. Nose: No congestion/rhinorrhea/epistaxis. Mouth/Throat: Mucous membranes are moist. Neck: Supple. No thyromegaly. Hematological/Lymphatic/Immunological: No cervical lymphadenopathy. Cardiovascular: Normal rate, regular rhythm. Normal distal pulses. Respiratory: Normal respiratory effort. No wheezes/rales/rhonchi. Gastrointestinal: Soft and nontender. No distention. GU: Deferred Musculoskeletal: Nontender with normal range of motion in all extremities.  Neurologic: Normal speech and language. No gross focal neurologic deficits are appreciated. Skin:  Skin is warm, dry and intact. No rash noted. ____________________________________________   LABS (pertinent positives/negatives) Labs Reviewed  URINALYSIS, COMPLETE (UACMP) WITH MICROSCOPIC - Abnormal; Notable for the following:       Result Value   Color, Urine AMBER (*)    APPearance HAZY (*)    Specific Gravity, Urine 1.043 (*)    Hgb urine dipstick SMALL (*)    Protein, ur 30 (*)    Leukocytes, UA TRACE (*)    Bacteria, UA FEW (*)    Squamous Epithelial / LPF 0-5 (*)    All other components within normal limits  URINE CULTURE  ____________________________________________  PROCEDURES  Voltaren 75 mg PO ____________________________________________  INITIAL IMPRESSION / ASSESSMENT AND PLAN / ED COURSE  Patient with symptoms consistent with a cystitis. She is discharged with a prescription for Bactrim and diclofenac diagnosis directed. She is also advised to take over-the-counter Delsym cough syrup as well as a daily allergy medicine. She should follow with primary care provider for ongoing symptom  management.  Clinical Course    ____________________________________________  FINAL CLINICAL IMPRESSION(S) / ED DIAGNOSES  Final diagnoses:  Viral URI with cough  Acute cystitis without hematuria  Chronic bilateral low back pain without sciatica      Melvenia Needles, PA-C 10/25/16 1649    Eula Listen, MD 10/25/16 2327

## 2016-10-25 NOTE — Discharge Instructions (Signed)
Take the antibiotic as directed until all pills are gone. Take the pain medicine for your back pain. Follow-up with your provider for continued symptoms.

## 2016-10-27 LAB — URINE CULTURE: Special Requests: NORMAL

## 2016-12-19 IMAGING — CR DG KNEE COMPLETE 4+V*R*
1 series · 4 of 4 positions shown · non-contrast
Comparison: 05/11/2014

CLINICAL DATA: Fall this afternoon with bruising, swelling and pain
anterior right knee.

EXAM:
RIGHT KNEE - COMPLETE 4+ VIEW

[Series 1: dg knee complete 4 views right · 0.14mm/px · 4 of 4 slices shown]
[im 1/4]
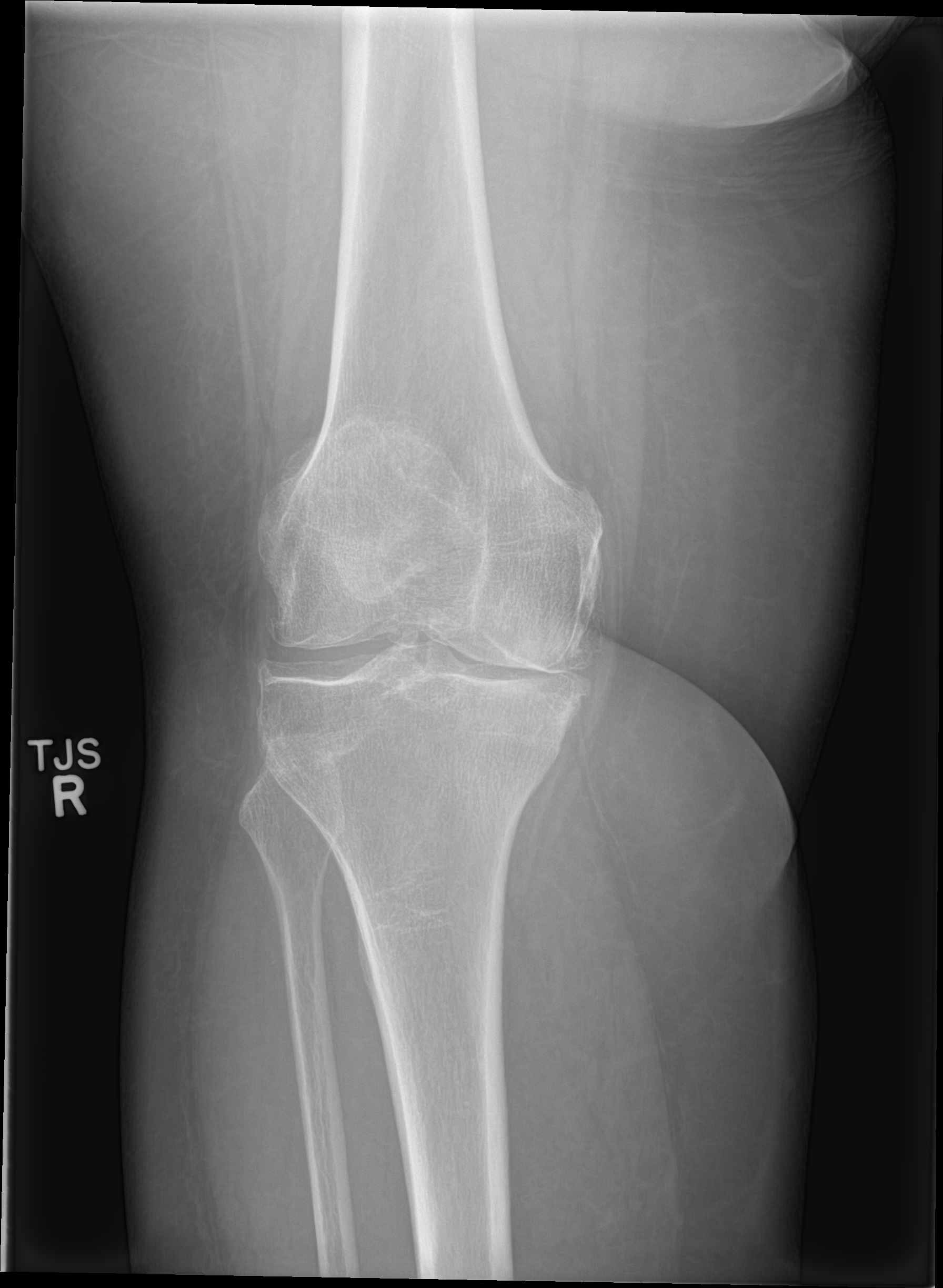
[im 2/4]
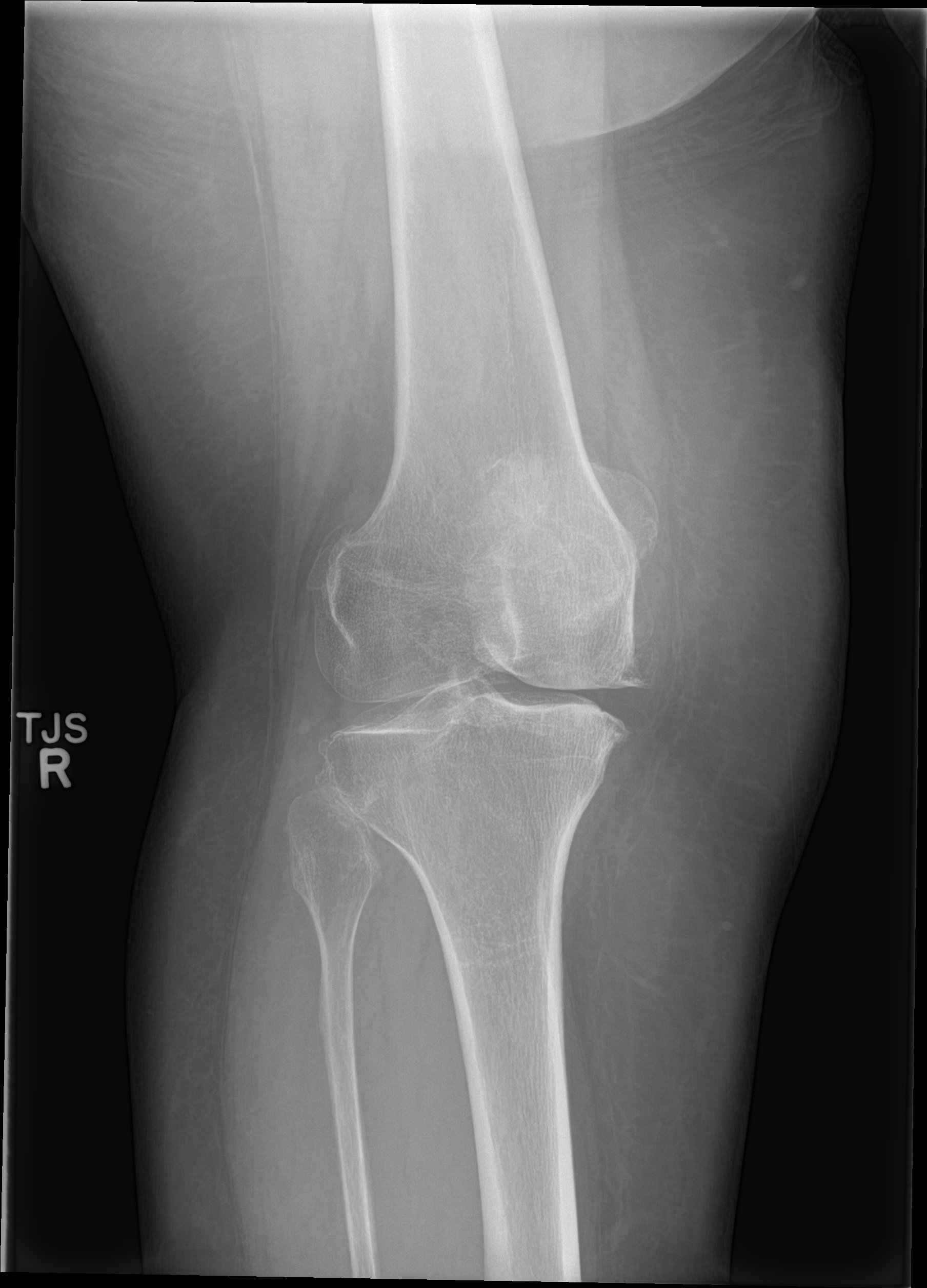
[im 3/4]
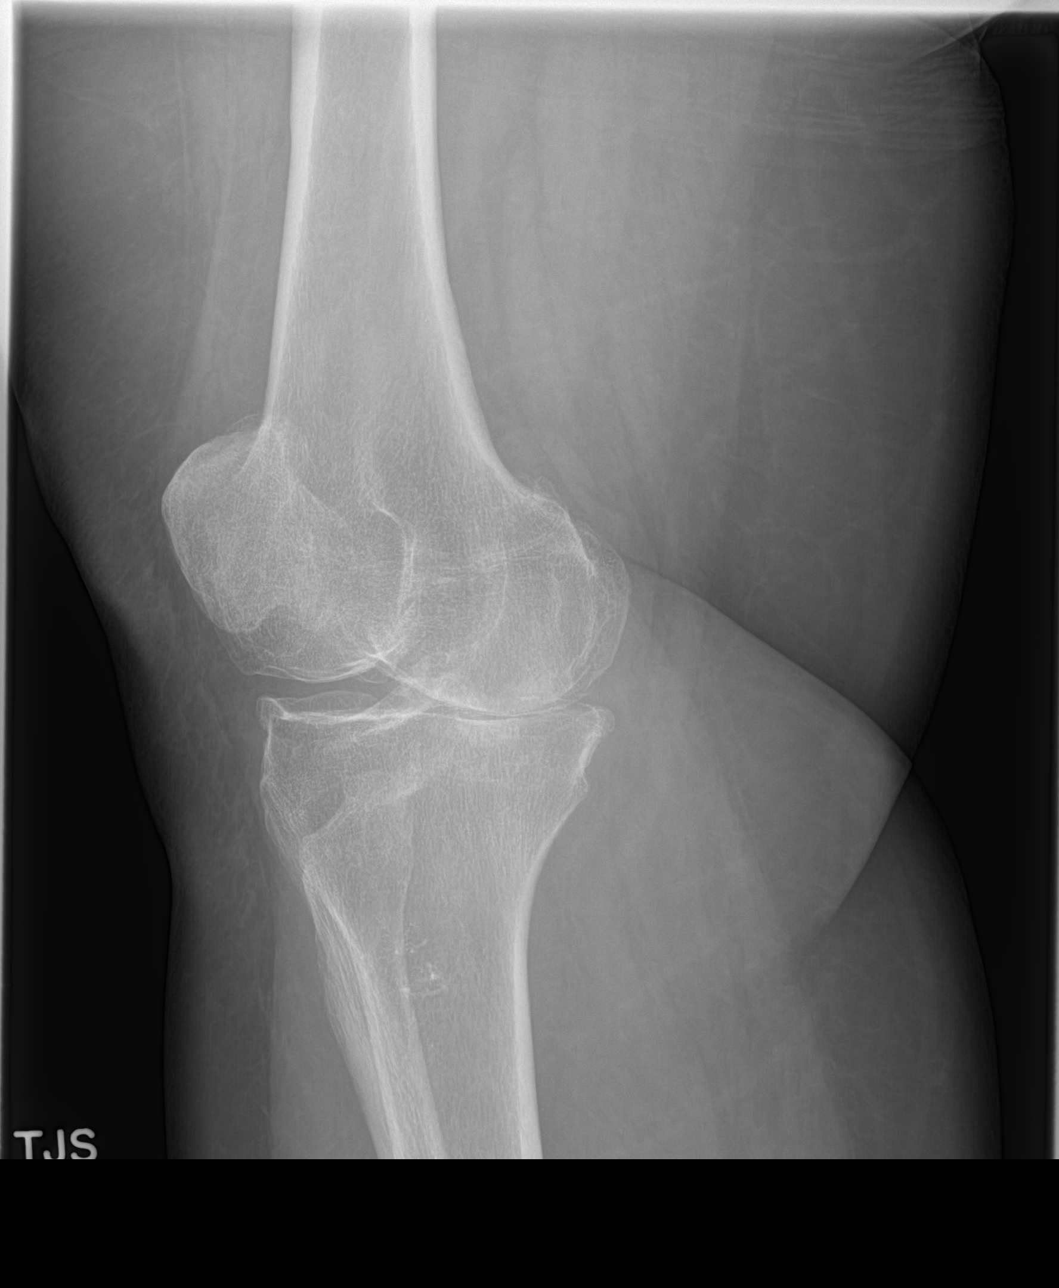
[im 4/4]
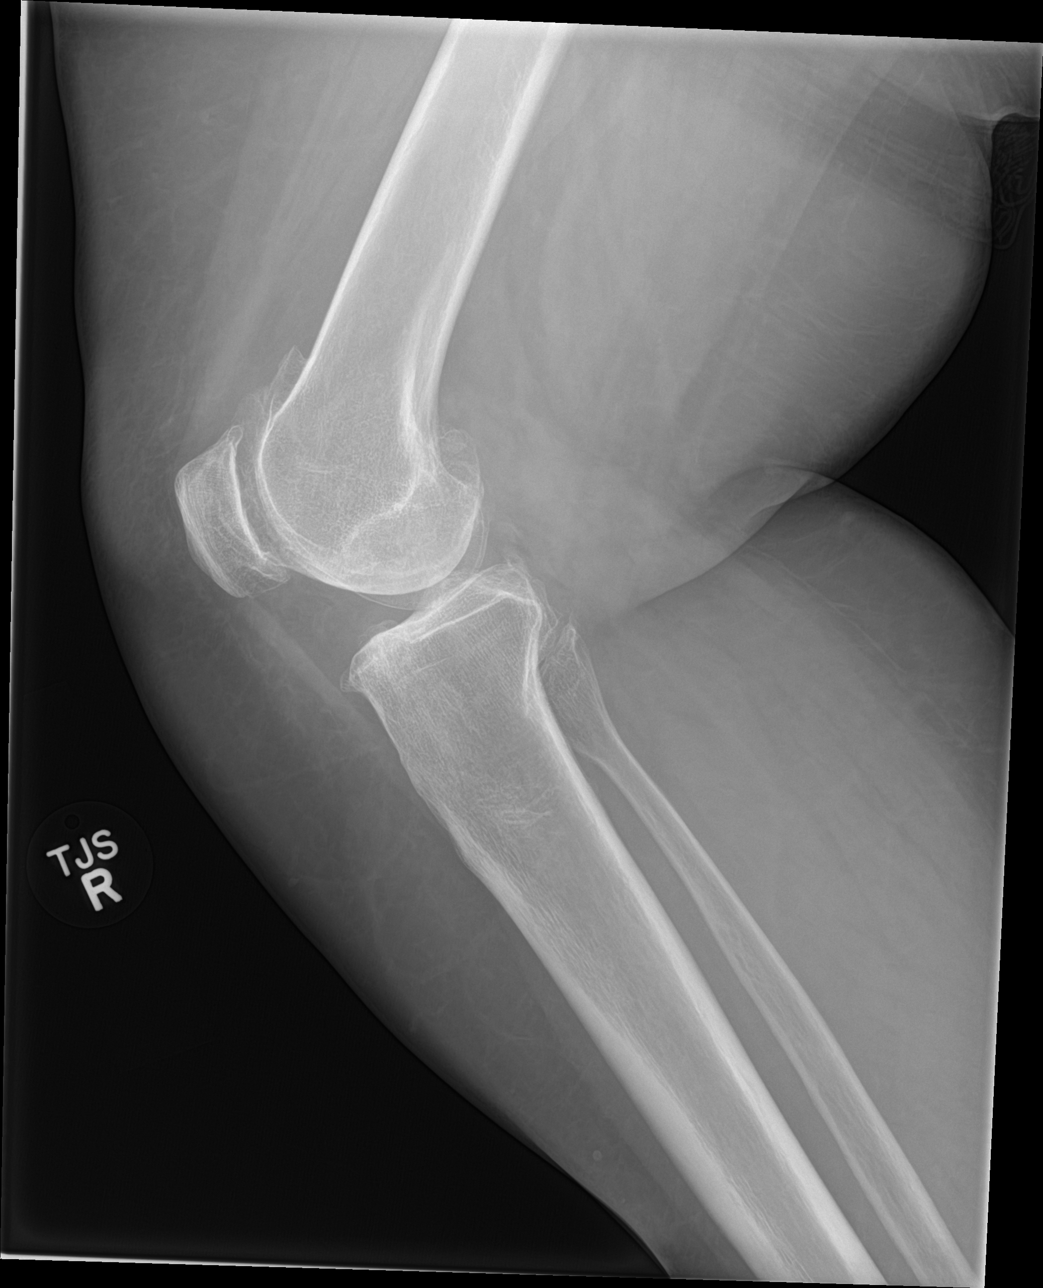

[4 of 4 positions shown; findings below may reference images not displayed]

FINDINGS: Examination demonstrates moderate tricompartmental osteoarthritic
change. There is mild diffuse decreased bone mineralization. There
is no acute fracture or dislocation. There is no significant joint
effusion.
IMPRESSION: No acute findings.

Moderate osteoarthritic change.

## 2017-01-09 IMAGING — CR DG LUMBAR SPINE 2-3V
1 series · 3 of 3 positions shown · non-contrast
Comparison: CT abdomen pelvis April 09, 2015

CLINICAL DATA: The patient slipped and fell down on the bathroom
floor with low back pain.

EXAM:
LUMBAR SPINE - 2-3 VIEW

[Series 1: dg lumbar spine 2-3 views · 0.14mm/px · 3 of 3 slices shown]
[im 1/3]
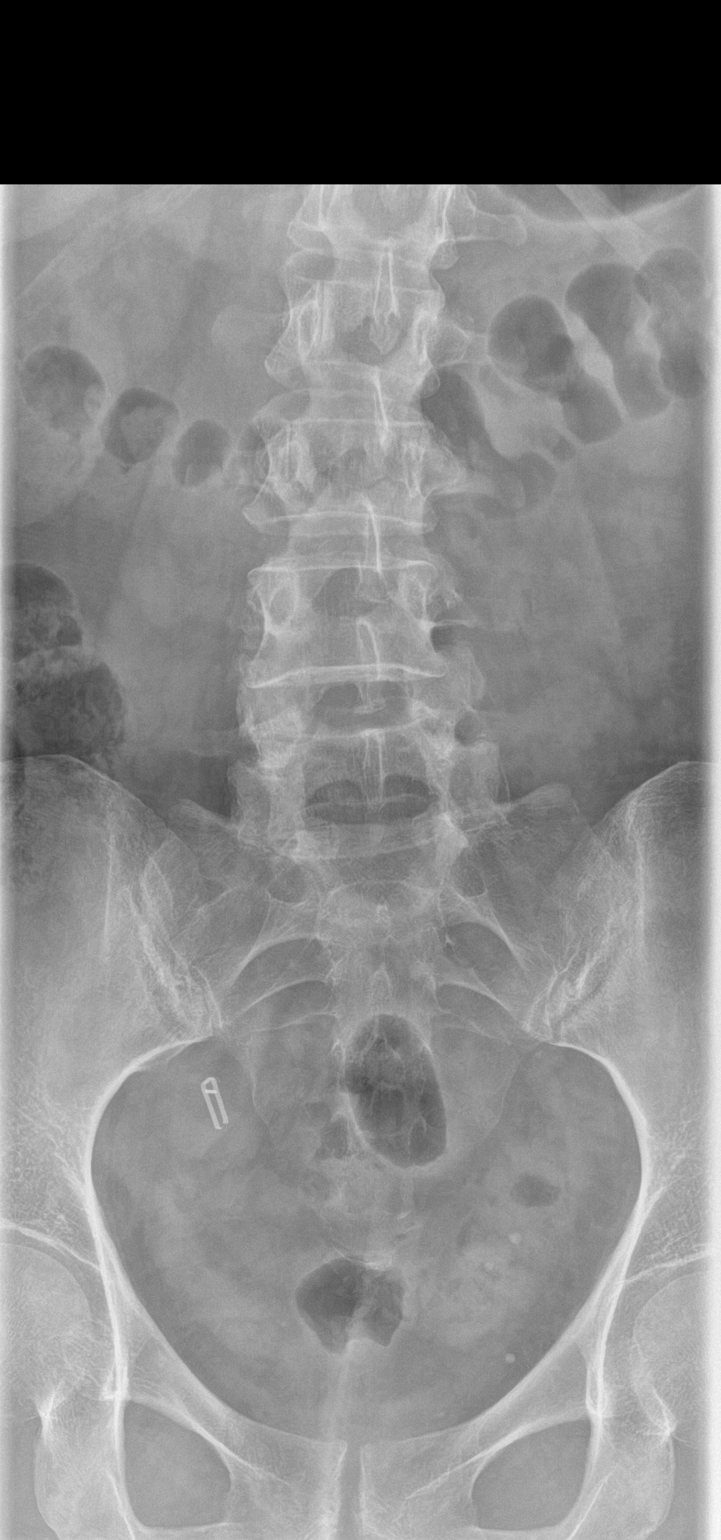
[im 2/3]
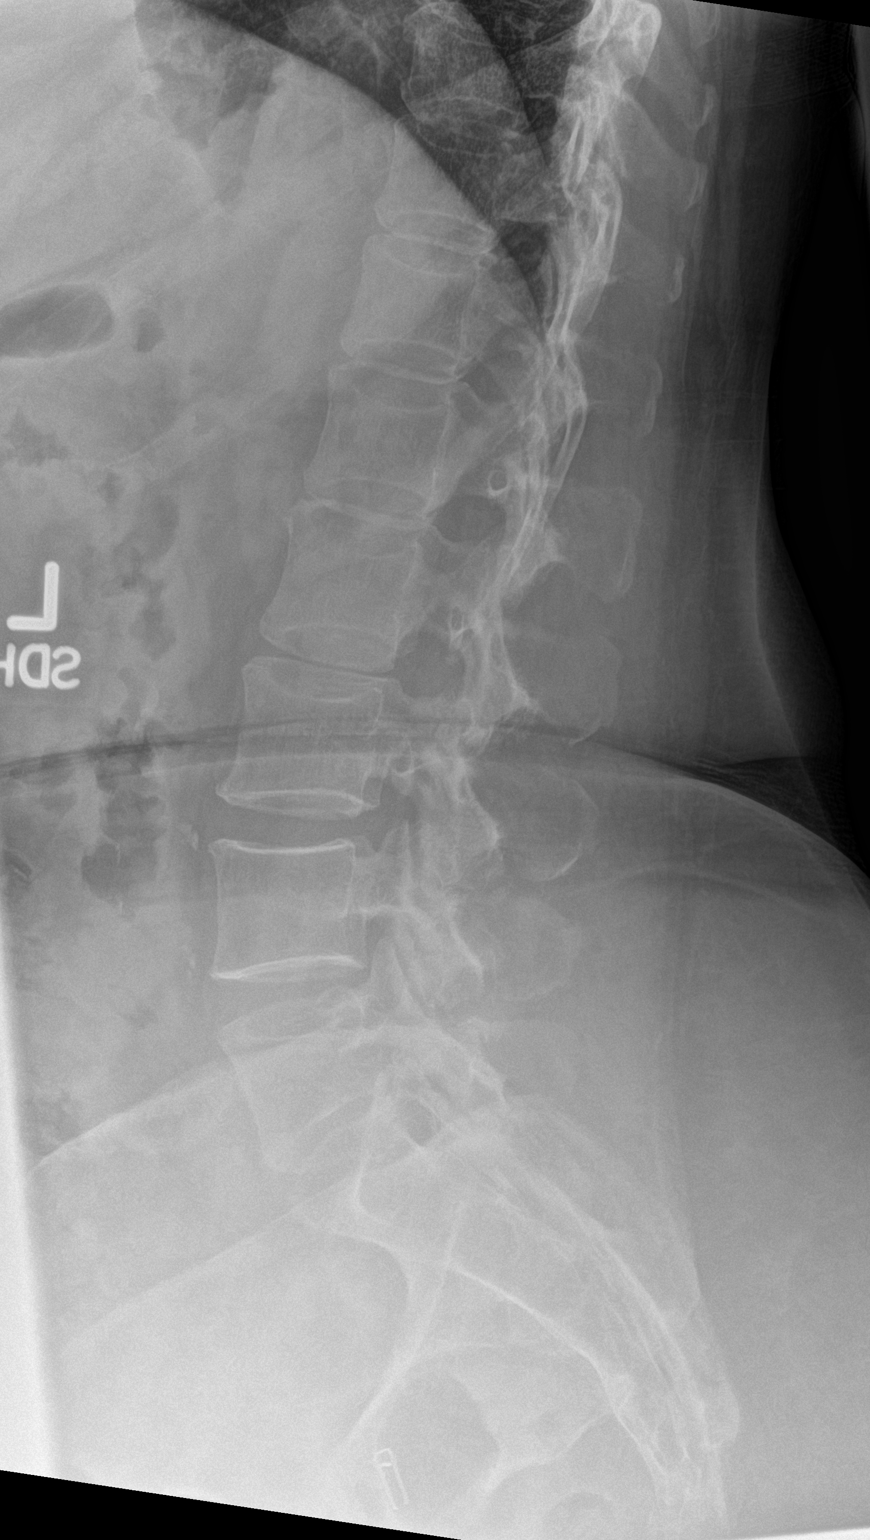
[im 3/3]
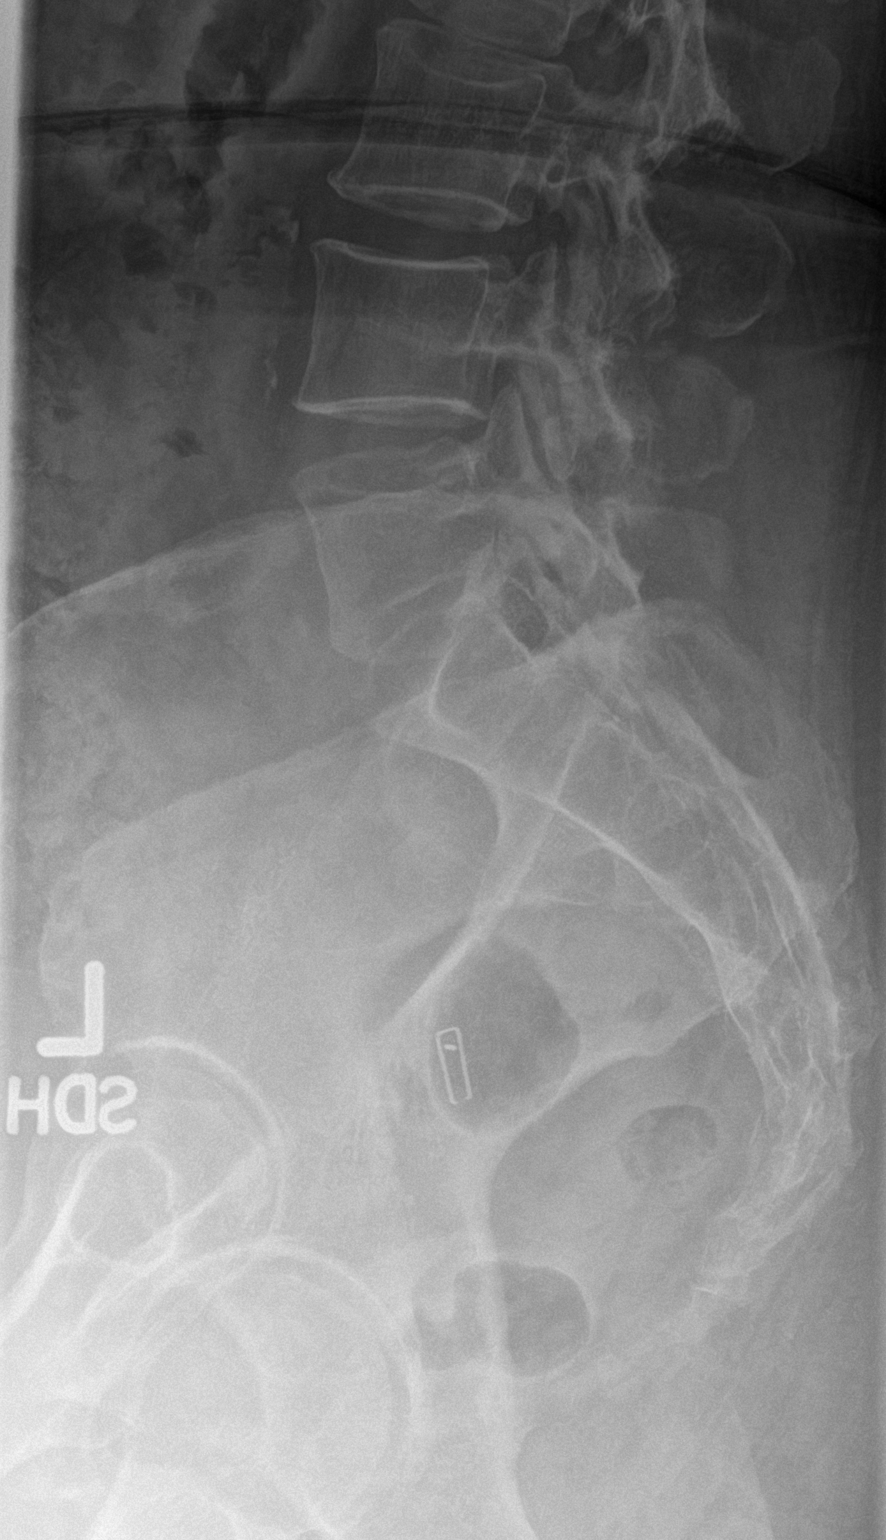

[3 of 3 positions shown; findings below may reference images not displayed]

FINDINGS: There is question cortical discontinuity in the distal sacrum not
seen on prior sagittal CT, question fracture. No other fracture or
dislocation is identified. There is scoliosis of spine.
IMPRESSION: There is question cortical discontinuity in the distal sacrum not
seen on prior sagittal CT, question fracture.

## 2017-01-09 IMAGING — CR DG HIP (WITH OR WITHOUT PELVIS) 2V BILAT
1 series · 5 of 5 positions shown · non-contrast
Comparison: None.

CLINICAL DATA: Pain following fall

EXAM:
DG HIP (WITH OR WITHOUT PELVIS) 4+V BILAT

[Series 1: view not recorded · 0.14mm/px · 5 of 5 slices shown]
[im 1/5]
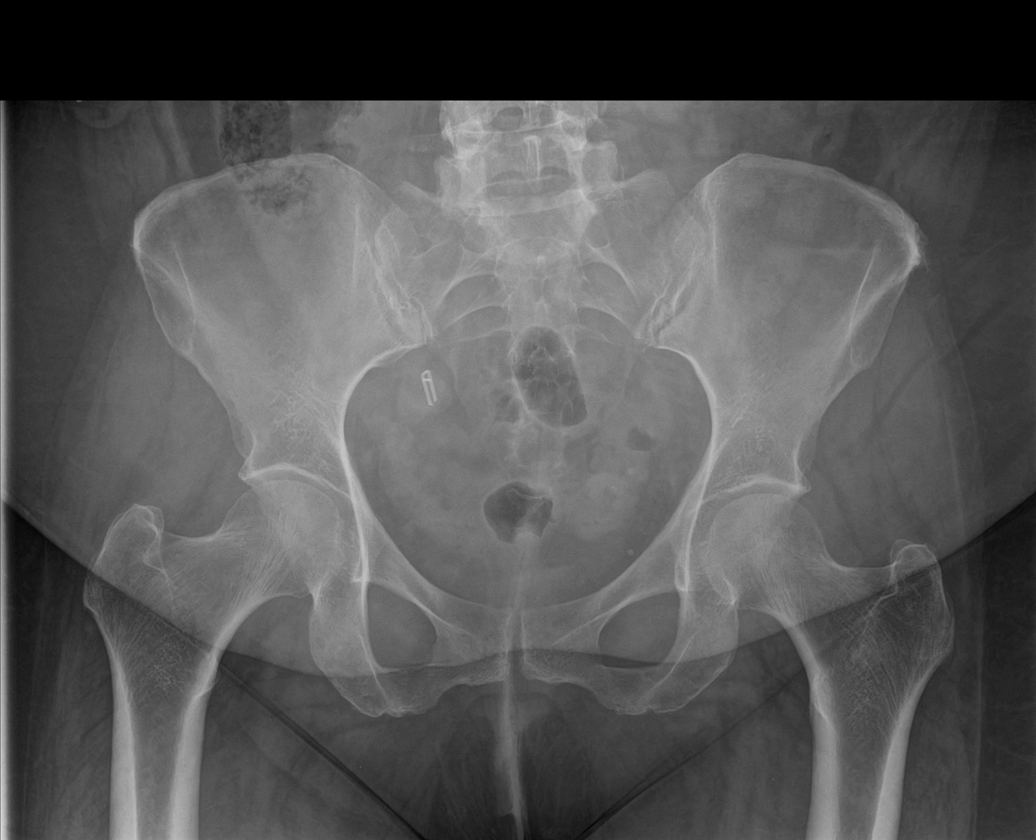
[im 2/5]
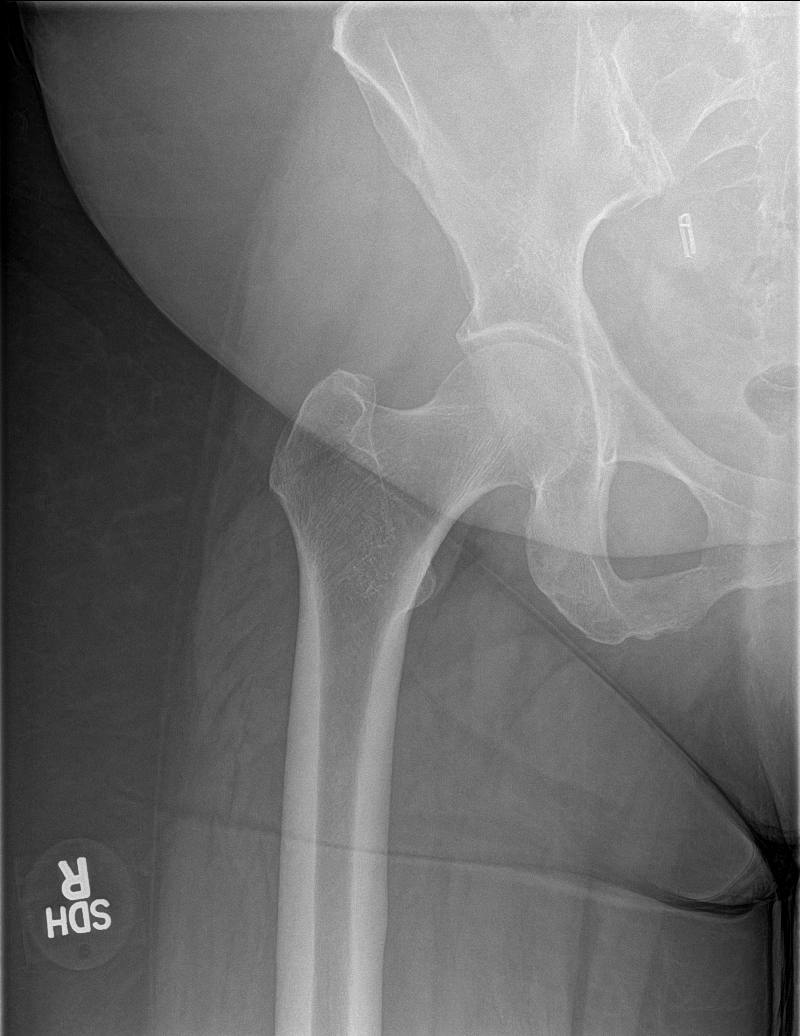
[im 3/5]
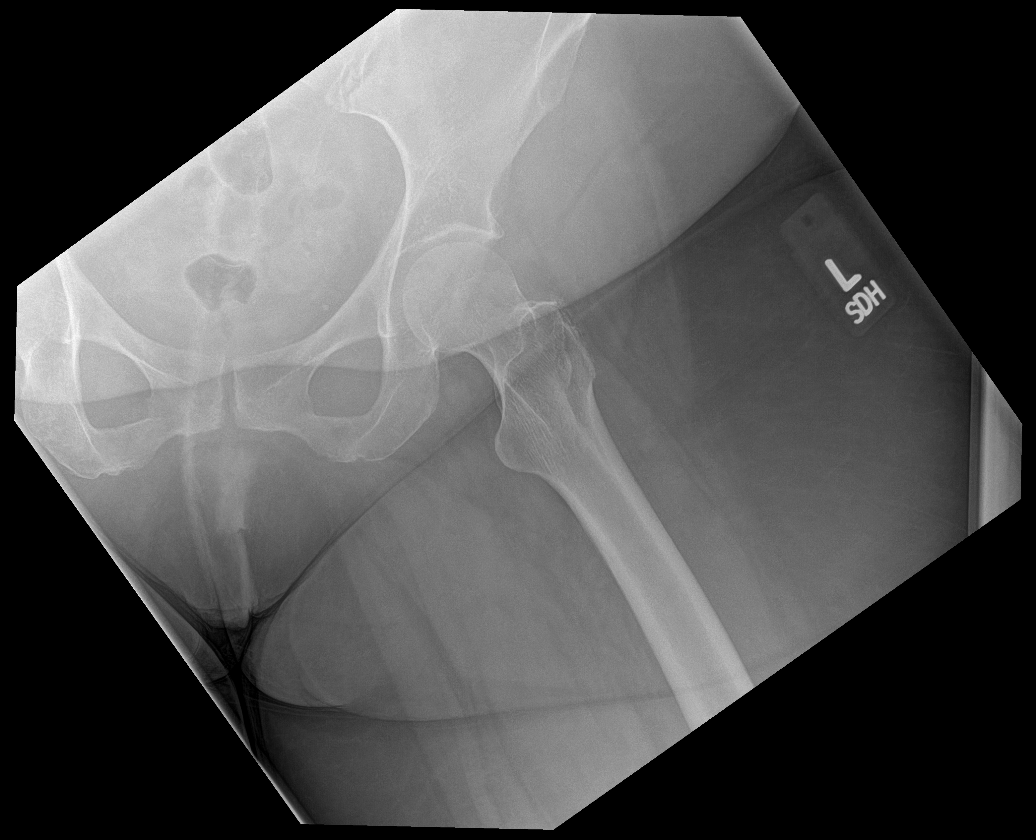
[im 4/5]
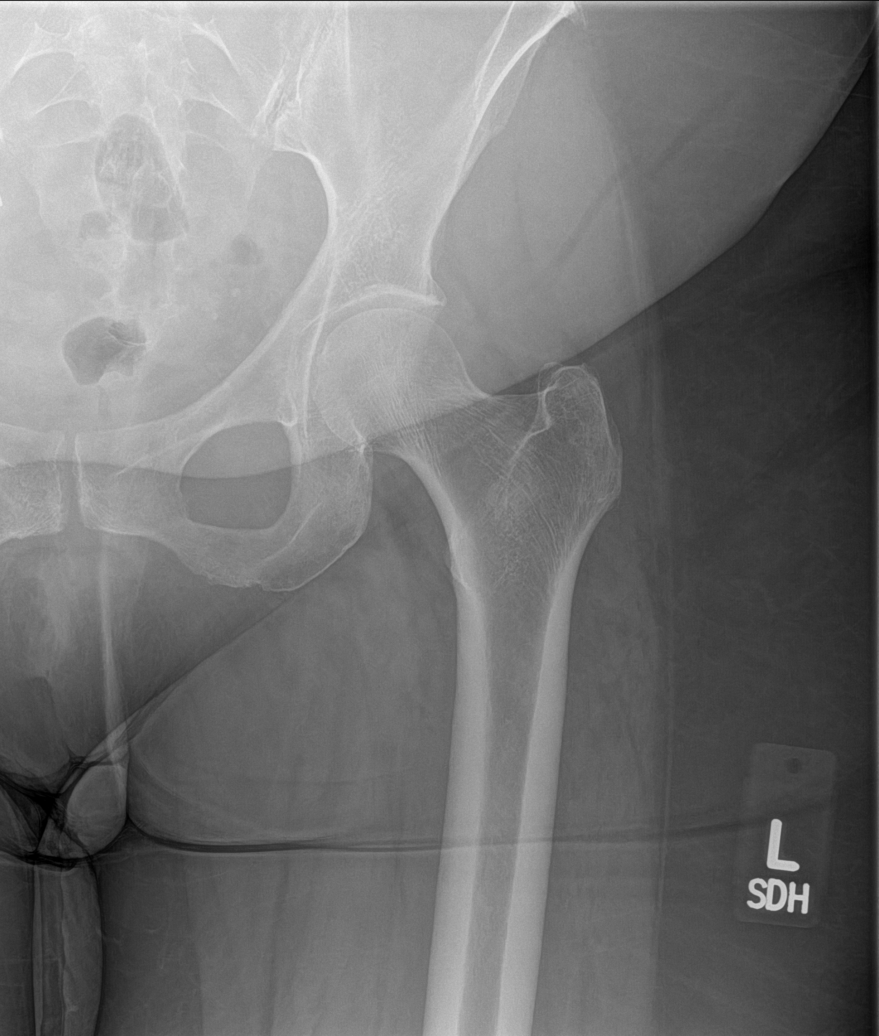
[im 5/5]
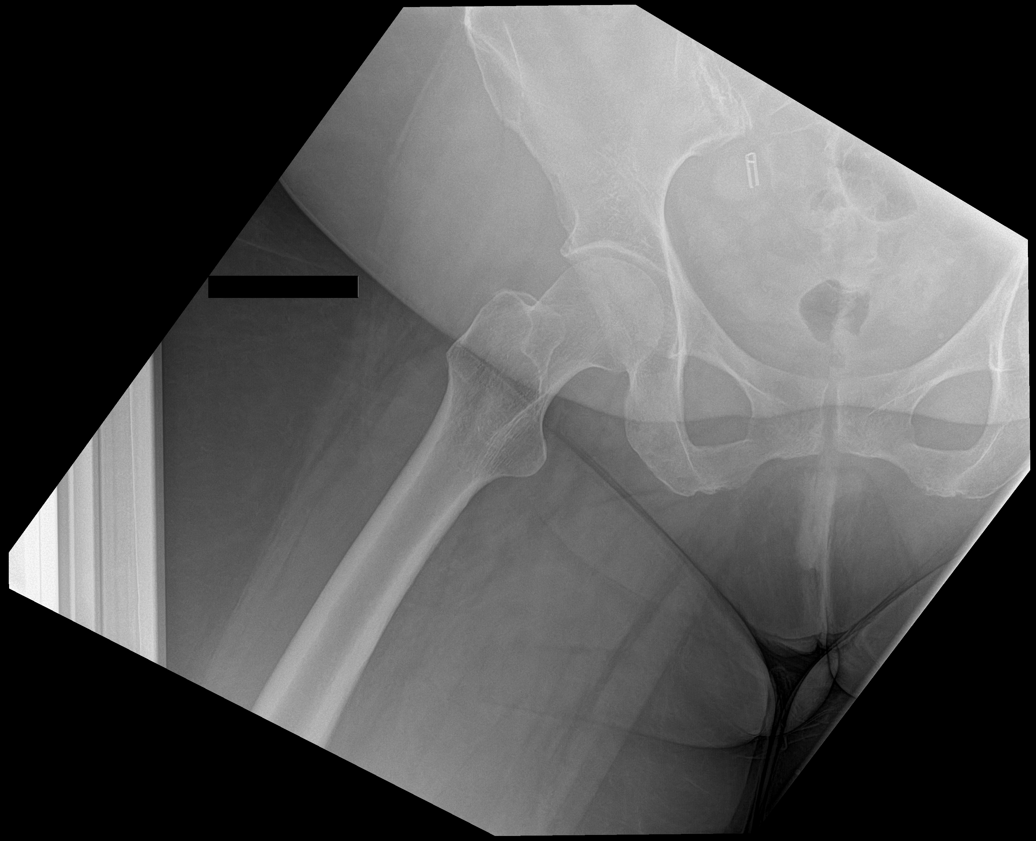

[5 of 5 positions shown; findings below may reference images not displayed]

FINDINGS: Frontal pelvis as well as frontal and lateral hip images bilaterally
obtained. There is no fracture or dislocation. Joint spaces appear
intact. No erosive change. There is a surgical clip in the right
pelvis.
IMPRESSION: No fracture or dislocation.  No appreciable arthropathy.

## 2017-01-09 IMAGING — CR DG KNEE COMPLETE 4+V*L*
1 series · 4 of 4 positions shown · non-contrast
Comparison: None.

CLINICAL DATA: Acute left knee pain after fall in bathroom. Initial
encounter.

EXAM:
LEFT KNEE - COMPLETE 4+ VIEW

[Series 1: dg knee complete 4 views left · 0.14mm/px · 4 of 4 slices shown]
[im 1/4]
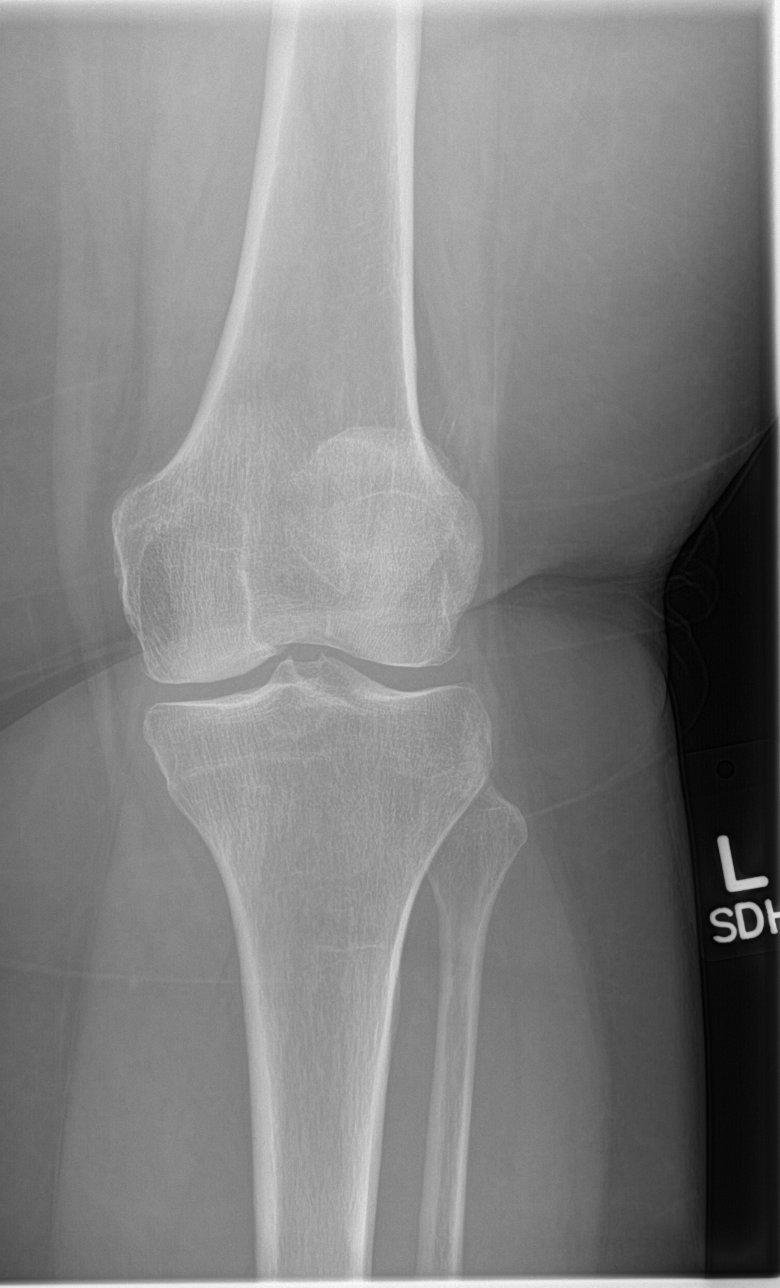
[im 2/4]
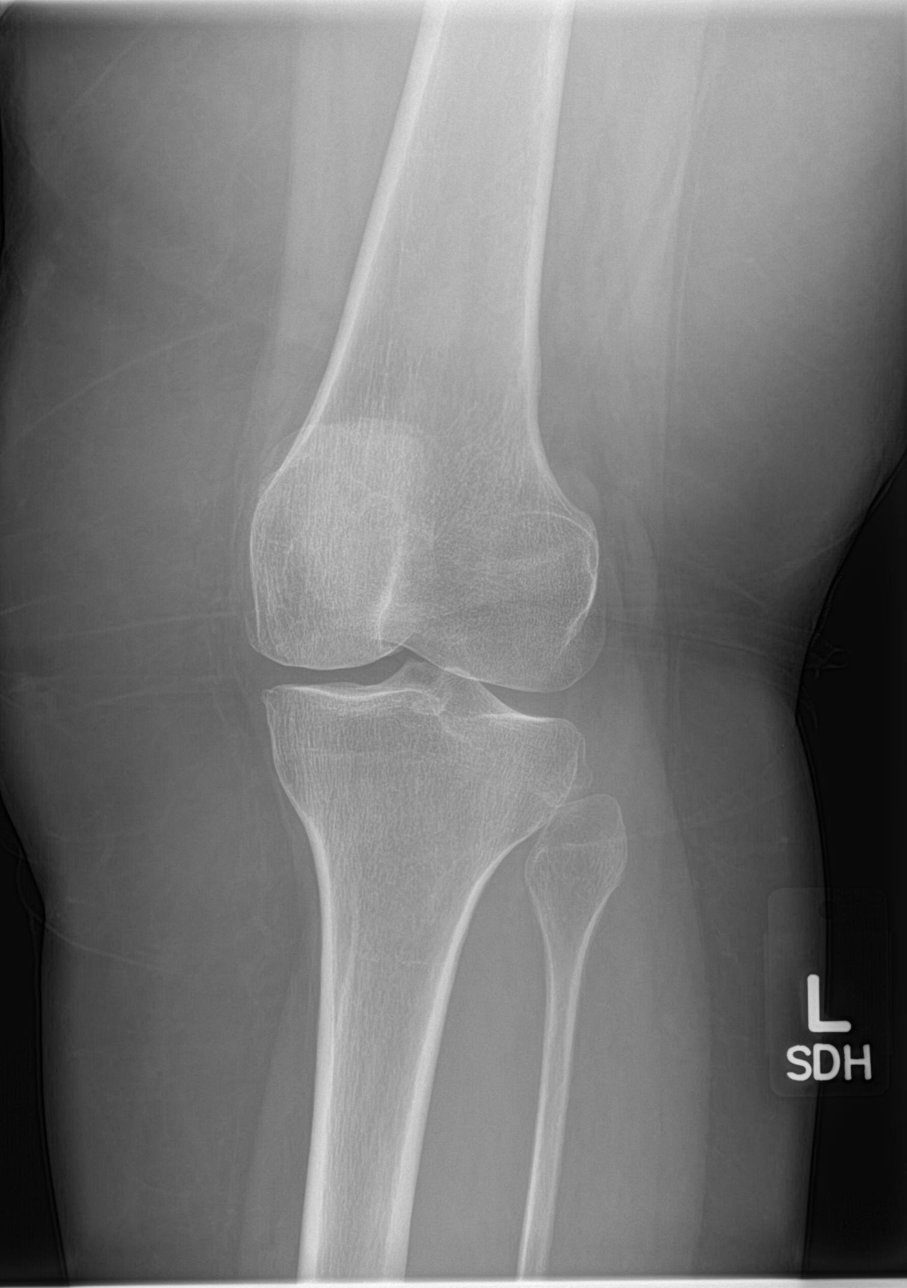
[im 3/4]
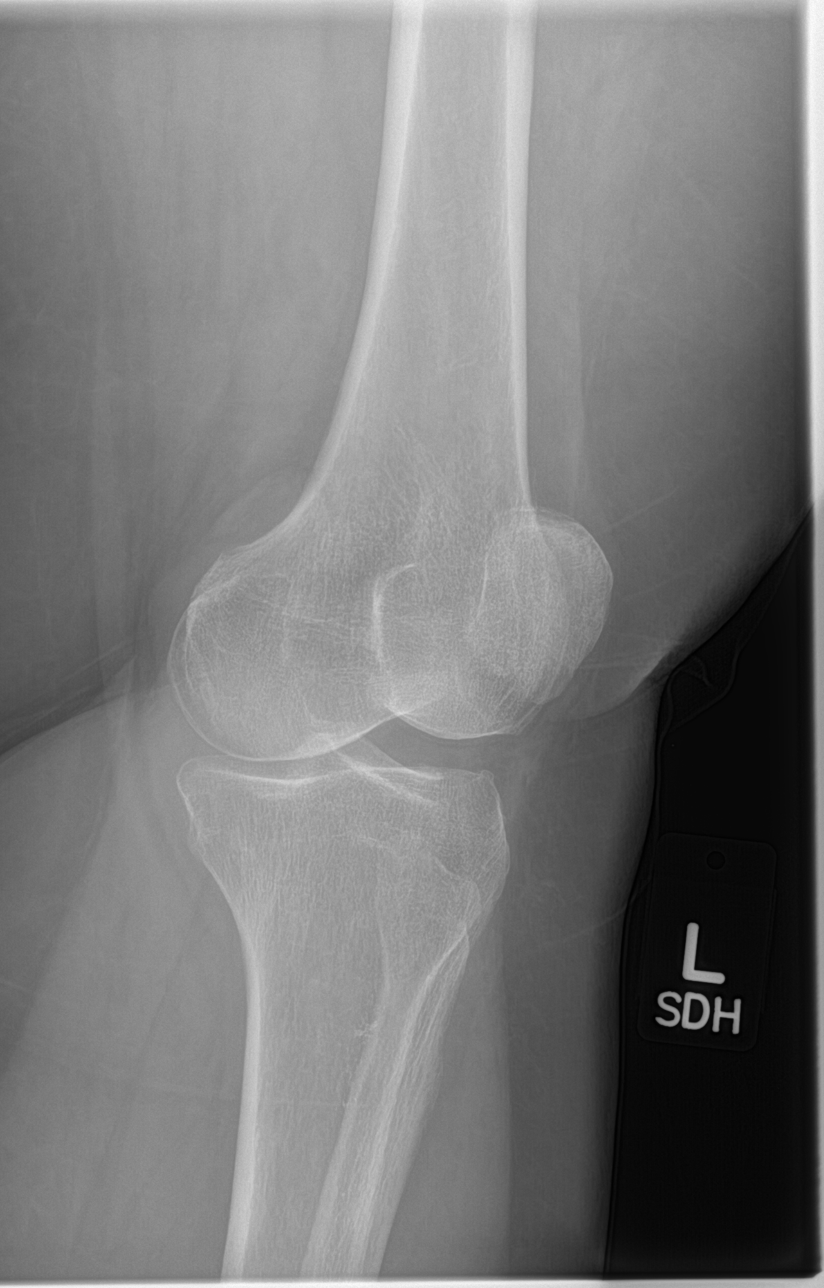
[im 4/4]
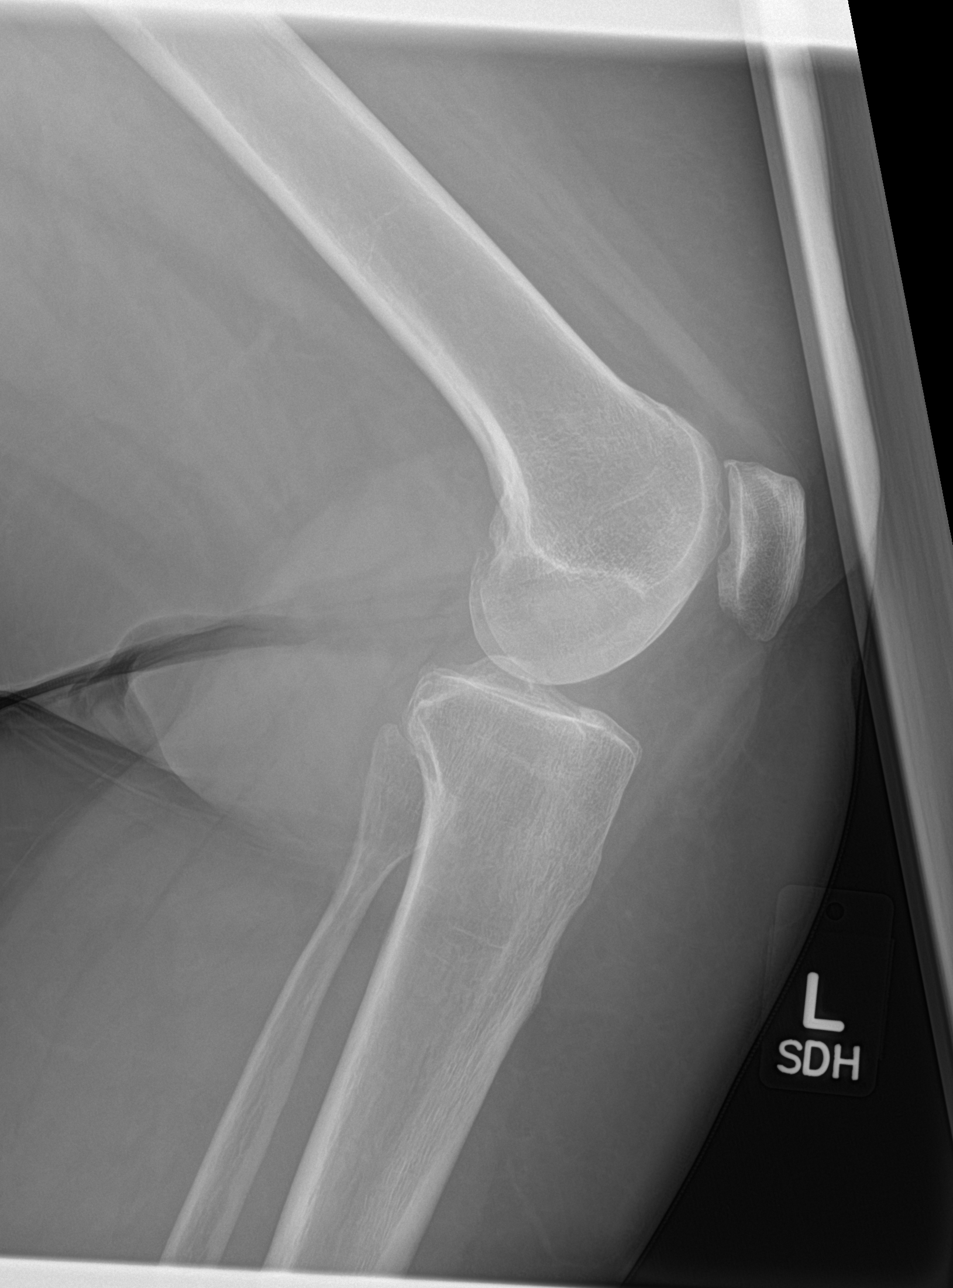

[4 of 4 positions shown; findings below may reference images not displayed]

FINDINGS: There is no evidence of fracture, dislocation, or joint effusion.
There is no evidence of arthropathy or other focal bone abnormality.
Soft tissues are unremarkable.
IMPRESSION: Normal left knee.

## 2017-01-09 IMAGING — CR DG KNEE COMPLETE 4+V*R*
1 series · 4 of 4 positions shown · non-contrast
Comparison: None.

CLINICAL DATA: Status post fall with right knee pain today.

EXAM:
RIGHT KNEE - COMPLETE 4+ VIEW

[Series 1: dg knee complete 4 views right · 0.14mm/px · 4 of 4 slices shown]
[im 1/4]
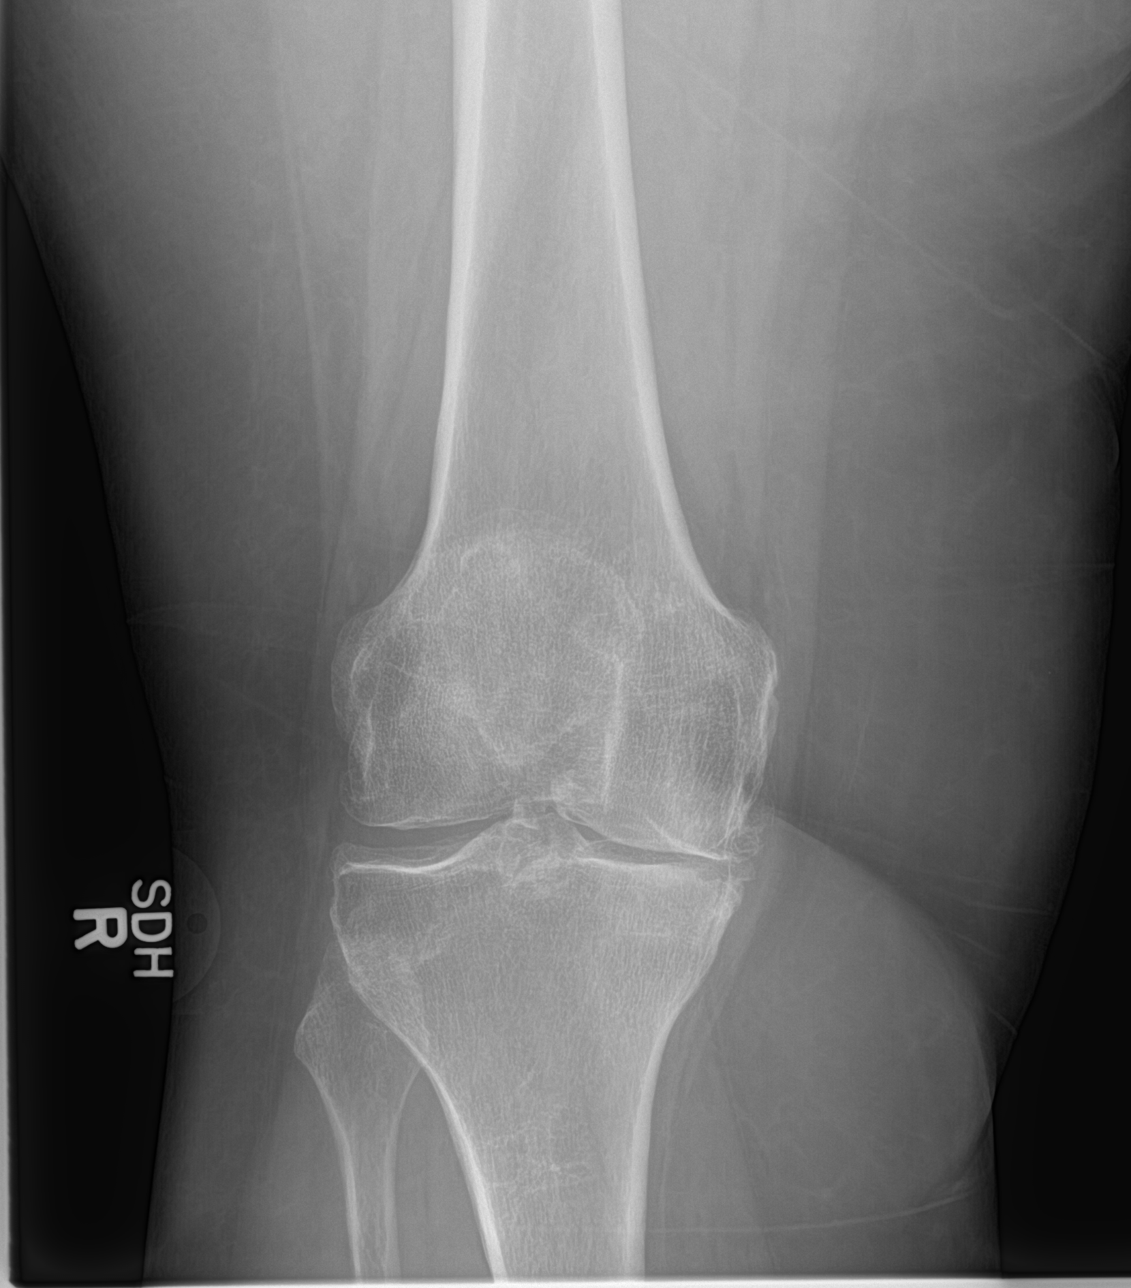
[im 2/4]
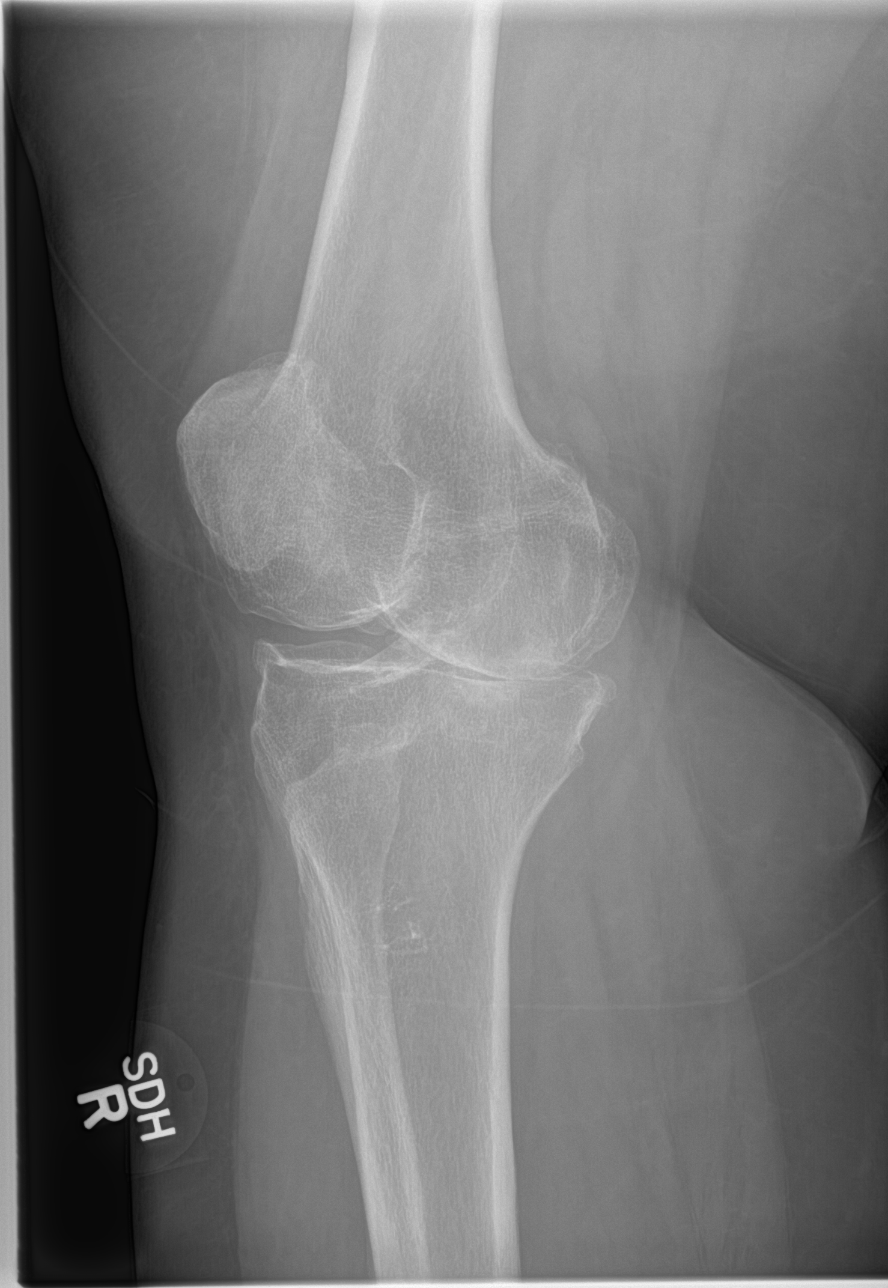
[im 3/4]
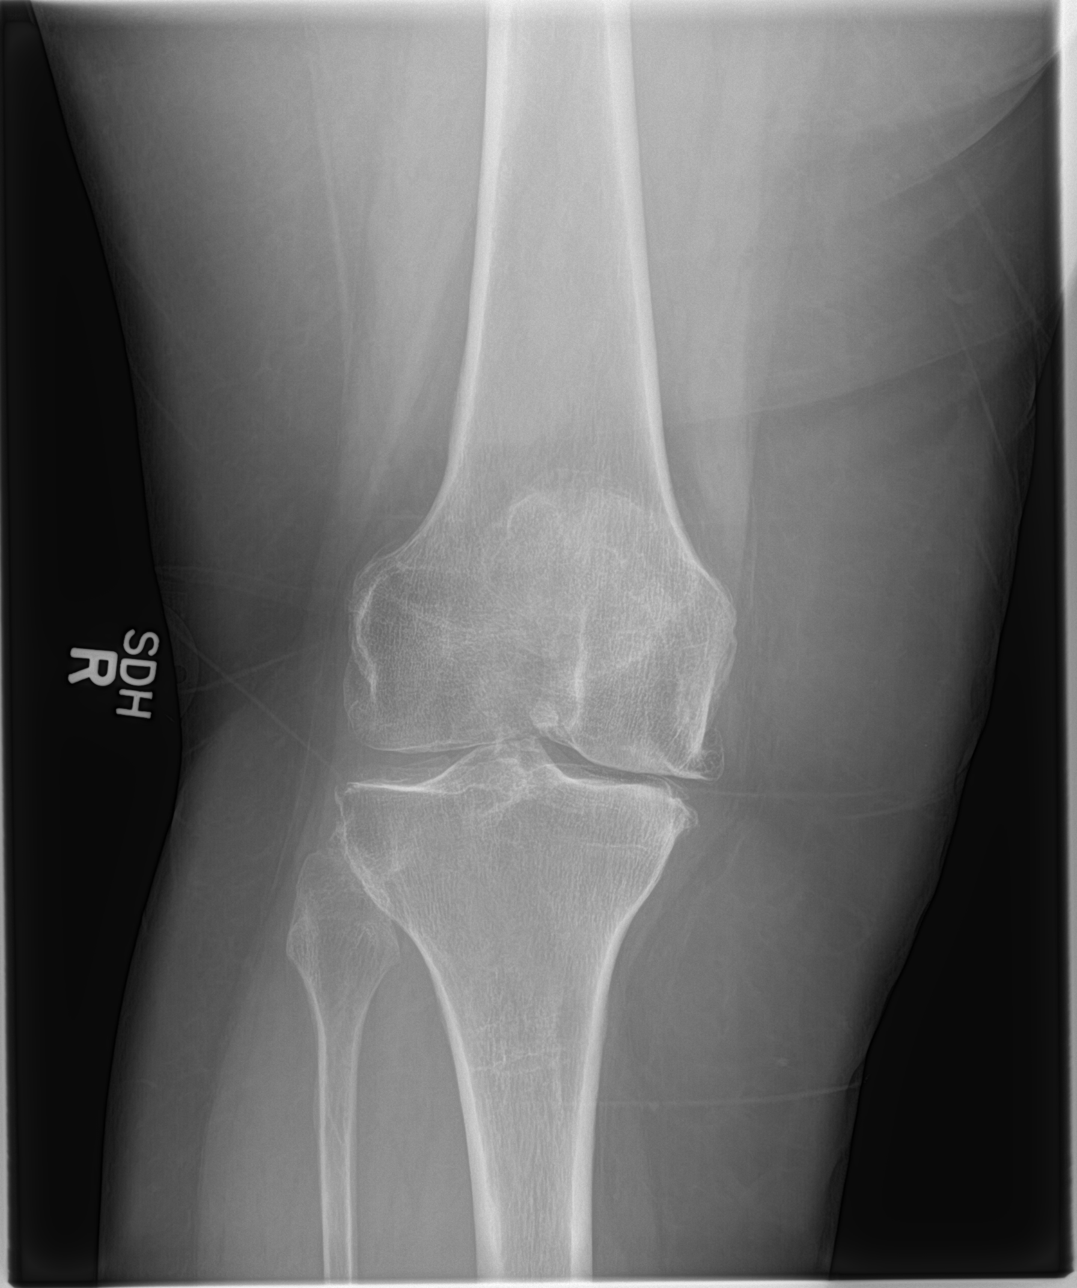
[im 4/4]
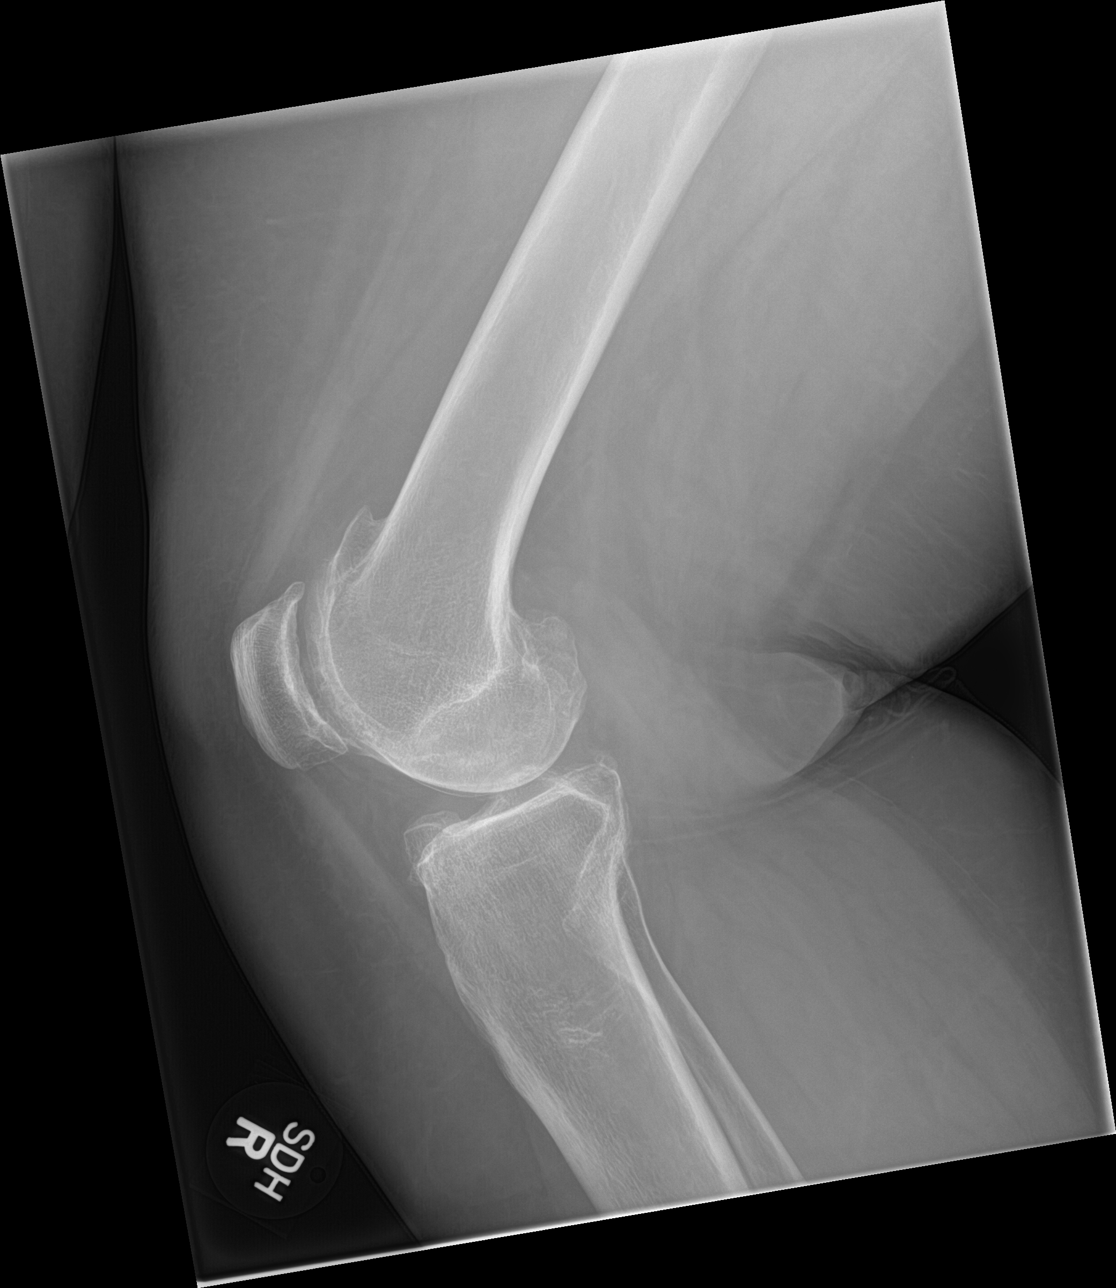

[4 of 4 positions shown; findings below may reference images not displayed]

FINDINGS: There is no evidence of fracture, dislocation, or joint effusion.
There are degenerative joint changes with narrowed joint space and
osteophyte formation. Soft tissues are unremarkable.
IMPRESSION: No acute fracture or dislocation. Osteoarthritic changes of the
right knee.

## 2017-03-05 ENCOUNTER — Ambulatory Visit: Payer: Self-pay | Attending: Oncology

## 2017-03-05 ENCOUNTER — Ambulatory Visit
Admission: RE | Admit: 2017-03-05 | Discharge: 2017-03-05 | Disposition: A | Discharge status: Home / Self Care | Payer: Self-pay | Source: Ambulatory Visit | Attending: Oncology | Admitting: Oncology

## 2017-03-05 VITALS — BP 156/88 | HR 63 | Temp 98.3°F | Ht 63.0 in | Wt 232.0 lb

## 2017-03-05 DIAGNOSIS — Z0000 Encounter for general adult medical examination without abnormal findings: Secondary | ICD-10-CM

## 2017-03-05 NOTE — Progress Notes (Signed)
Subjective:     Patient ID: Carla [name], female   DOB: 1961-07-30, 56 y.o.   MRN: [id number]  HPI   Review of Systems     Objective:   Physical Exam  Pulmonary/Chest: Right breast exhibits no inverted nipple, no mass, no nipple discharge, no skin change and no tenderness. Left breast exhibits no inverted nipple, no mass, no nipple discharge, no skin change and no tenderness. Breasts are symmetrical.       Assessment:     56 year old patient presents for [facility] clinic visit.  Patient screened, and meets [facility] eligibility.  Patient does not have insurance, Medicare or Medicaid.  Handout given on Affordable Care Act.  Instructed patient on breast self-exam using teach back method.  CBE unremarkable.  No mass or lump palpated.   Patient reports she did not take her blood pressure medication this morning because she overslept.  Reviewed effectiveness, and importance of taking medication as prescribed, at the same time daily.    Plan:     Sent for bilateral screening mammogram.

## 2017-03-20 NOTE — Progress Notes (Signed)
Letter mailed from [facility] to notify of normal mammogram results.  Patient to return in one year for annual screening.  Copy to [facility]. 

## 2017-03-24 ENCOUNTER — Emergency Department
Admission: EM | Admit: 2017-03-24 | Discharge: 2017-03-24 | Disposition: A | Discharge status: Home / Self Care | Payer: Medicaid Other | Attending: Emergency Medicine | Admitting: Emergency Medicine

## 2017-03-24 ENCOUNTER — Emergency Department: Payer: Medicaid Other

## 2017-03-24 ENCOUNTER — Encounter: Payer: Self-pay | Admitting: Emergency Medicine

## 2017-03-24 DIAGNOSIS — Z7982 Long term (current) use of aspirin: Secondary | ICD-10-CM | POA: Insufficient documentation

## 2017-03-24 DIAGNOSIS — I10 Essential (primary) hypertension: Secondary | ICD-10-CM | POA: Diagnosis not present

## 2017-03-24 DIAGNOSIS — R052 Subacute cough: Secondary | ICD-10-CM

## 2017-03-24 DIAGNOSIS — F17210 Nicotine dependence, cigarettes, uncomplicated: Secondary | ICD-10-CM | POA: Insufficient documentation

## 2017-03-24 DIAGNOSIS — R05 Cough: Secondary | ICD-10-CM | POA: Diagnosis present

## 2017-03-24 DIAGNOSIS — Z79899 Other long term (current) drug therapy: Secondary | ICD-10-CM | POA: Diagnosis not present

## 2017-03-24 MED ORDER — BENZONATATE 100 MG PO CAPS: 100.0000 mg | ORAL_CAPSULE | Freq: Three times a day (TID) | ORAL | 0 refills | Status: DC | PRN

## 2017-03-24 NOTE — ED Notes (Signed)
See triage note  States she lives in a motel and has bed bugs  Was setting out bombs' for the bug   States she leaned down and the mist went in her face  No resp distress noted on arrival

## 2017-03-24 NOTE — ED Triage Notes (Signed)
Pt to ed with c/o inhaling a bug spray today.  Pt states she was setting them off in her house and then breathed in some of the air from it.  Pt reports it caused her to cough.  Pt in no acute resp distress at this time, breathing easy, sats 97%, able to speak in full complete sentences.

## 2017-03-24 NOTE — ED Provider Notes (Signed)
[facility] Emergency Department Provider Note   ____________________________________________   First MD Initiated Contact with Patient 03/24/17 1010     (approximate)  I have reviewed the triage vital signs and the nursing notes.   HISTORY  Chief Complaint Toxic Inhalation    HPI Carla [name] is a 56 y.o. female patient complain cough and mild bronchial irritation secondary to inhalation aerosol bug spray. Patient denies acute discomfort the concern long-term effects secondary to exposure. Patient denies pain at this time. Past Medical History:  Diagnosis Date  . Bell's palsy   . GERD (gastroesophageal reflux disease)   . Hyperlipidemia   . Hypertension   . Morbid obesity with BMI of 40.0-44.9, adult [facility])     Patient Active Problem List   Diagnosis Date Noted  . Chest pain 08/06/2012    Past Surgical History:  Procedure Laterality Date  . CHOLECYSTECTOMY    . VAGINAL HYSTERECTOMY      Prior to Admission medications   Medication Sig Start Date End Date Taking? Authorizing Provider  aspirin EC 81 MG tablet Take 81 mg by mouth daily.    [provider]  atorvastatin (LIPITOR) 40 MG tablet Take 40 mg by mouth at bedtime.     [provider]  benzonatate (TESSALON PERLES) 100 MG capsule Take 1 capsule (100 mg total) by mouth 3 (three) times daily as needed for cough. 03/24/17   Sable Feil, PA-C  citalopram (CELEXA) 20 MG tablet Take 20 mg by mouth daily. Reported on 01/24/2016    [provider]  cyclobenzaprine (FLEXERIL) 5 MG tablet Take 1 tablet (5 mg total) by mouth 3 (three) times daily as needed for muscle spasms. 08/06/16   Menshew, Dannielle Karvonen, PA-C  diclofenac (VOLTAREN) 50 MG EC tablet Take 1 tablet (50 mg total) by mouth 2 (two) times daily. 10/25/16   Menshew, Dannielle Karvonen, PA-C  ketorolac (TORADOL) 10 MG tablet Take 1 tablet (10 mg total) by mouth every 8 (eight) hours. 08/06/16   Menshew, Dannielle Karvonen, PA-C  metoprolol tartrate (LOPRESSOR) 25 MG tablet Take 25 mg by mouth 2 (two) times daily.    [provider]  naproxen (NAPROSYN) 500 MG tablet Take 1 tablet (500 mg total) by mouth 2 (two) times daily with a meal. 08/03/16   Carrie Mew, MD    Allergies Patient has no known allergies.  Family History  Problem Relation Age of Onset  . Hypertension Mother   . Heart disease Mother   . Heart attack Mother   . Breast cancer Mother   . Hypertension Father   . Breast cancer Paternal Aunt     Social History Social History  Substance Use Topics  . Smoking status: Current Some Day Smoker    Packs/day: 0.50    Types: Cigarettes  . Smokeless tobacco: Never Used  . Alcohol use No    Review of Systems Constitutional: No fever/chills Eyes: No visual changes. ENT: No sore throat. Cardiovascular: Denies chest pain. Respiratory: Denies shortness of breath. coughing Gastrointestinal: No abdominal pain.  No nausea, no vomiting.  No diarrhea.  No constipation. Genitourinary: Negative for dysuria. Musculoskeletal: Negative for back pain. Skin: Negative for rash. Neurological: Negative for headaches, focal weakness or numbness. Endocrine:hyperlipidemia and hypertension  ____________________________________________   PHYSICAL EXAM:  VITAL SIGNS: ED Triage Vitals [03/24/17 0959]  Enc Vitals Group     BP (!) 153/88     Pulse Rate 98  Resp 18     Temp 98 F (36.7 C)     Temp Source Oral     SpO2 97 %     Weight 232 lb (105.2 kg)     Height      Head Circumference      Peak Flow      Pain Score 0     Pain Loc      Pain Edu?      Excl. in [place]?     Constitutional: Alert and oriented. Well appearing and in no acute distress. Eyes: Conjunctivae are normal. PERRL. EOMI. Head: Atraumatic. Nose: No congestion/rhinnorhea. Mouth/Throat: Mucous membranes are moist.  Oropharynx non-erythematous. Neck: No stridor.  No cervical spine tenderness to  palpation. Hematological/Lymphatic/Immunilogical: No cervical lymphadenopathy. Cardiovascular: Normal rate, regular rhythm. Grossly normal heart sounds.  Good peripheral circulation. Respiratory: Normal respiratory effort.  No retractions. Lungs CTAB. Gastrointestinal: Soft and nontender. No distention. No abdominal bruits. No CVA tenderness. Musculoskeletal: No lower extremity tenderness nor edema.  No joint effusions. Neurologic:  Normal speech and language. No gross focal neurologic deficits are appreciated. No gait instability. Skin:  Skin is warm, dry and intact. No rash noted. Psychiatric: Mood and affect are normal. Speech and behavior are normal.  ____________________________________________   LABS (all labs ordered are listed, but only abnormal results are displayed)  Labs Reviewed - No data to display ____________________________________________  EKG   ____________________________________________  RADIOLOGY  Findings on chest x-ray ____________________________________________   PROCEDURES  Procedure(s) performed: None  Procedures  Critical Care performed: No  ____________________________________________   INITIAL IMPRESSION / ASSESSMENT AND PLAN / ED COURSE  Pertinent labs & imaging results that were available during my care of the patient were reviewed by me and considered in my medical decision making (see chart for details).  Resolved cough secondary to chemical exposure. Discussed negative x-ray finding with patient. Patient given discharge care instructions and advised [facility].      ____________________________________________   FINAL CLINICAL IMPRESSION(S) / ED DIAGNOSES  Final diagnoses:  Subacute cough      NEW MEDICATIONS STARTED DURING THIS VISIT:  Discharge Medication List as of 03/24/2017 10:53 AM    START taking these medications   Details  benzonatate (TESSALON PERLES) 100 MG capsule  Take 1 capsule (100 mg total) by mouth 3 (three) times daily as needed for cough., Starting Sat 03/24/2017, Print         Note:  This document was prepared using Dragon voice recognition software and may include unintentional dictation errors.    Sable Feil, PA-C 03/24/17 1058    Lavonia Drafts, MD 03/24/17 1126

## 2017-05-05 ENCOUNTER — Emergency Department
Admission: EM | Admit: 2017-05-05 | Discharge: 2017-05-05 | Disposition: A | Discharge status: Home / Self Care | Payer: Medicaid Other | Attending: Emergency Medicine | Admitting: Emergency Medicine

## 2017-05-05 ENCOUNTER — Emergency Department: Payer: Medicaid Other

## 2017-05-05 ENCOUNTER — Encounter: Payer: Self-pay | Admitting: Emergency Medicine

## 2017-05-05 DIAGNOSIS — Z7982 Long term (current) use of aspirin: Secondary | ICD-10-CM | POA: Diagnosis not present

## 2017-05-05 DIAGNOSIS — M85851 Other specified disorders of bone density and structure, right thigh: Secondary | ICD-10-CM | POA: Diagnosis not present

## 2017-05-05 DIAGNOSIS — M25551 Pain in right hip: Secondary | ICD-10-CM | POA: Diagnosis present

## 2017-05-05 DIAGNOSIS — I10 Essential (primary) hypertension: Secondary | ICD-10-CM | POA: Insufficient documentation

## 2017-05-05 DIAGNOSIS — Z791 Long term (current) use of non-steroidal anti-inflammatories (NSAID): Secondary | ICD-10-CM | POA: Diagnosis not present

## 2017-05-05 DIAGNOSIS — Z79899 Other long term (current) drug therapy: Secondary | ICD-10-CM | POA: Diagnosis not present

## 2017-05-05 DIAGNOSIS — M1711 Unilateral primary osteoarthritis, right knee: Secondary | ICD-10-CM | POA: Diagnosis not present

## 2017-05-05 DIAGNOSIS — F17210 Nicotine dependence, cigarettes, uncomplicated: Secondary | ICD-10-CM | POA: Diagnosis not present

## 2017-05-05 DIAGNOSIS — M1990 Unspecified osteoarthritis, unspecified site: Secondary | ICD-10-CM

## 2017-05-05 IMAGING — CR DG KNEE COMPLETE 4+V*R*
1 series · 4 of 4 positions shown · non-contrast
Comparison: The radiograph 07/27/2015.

CLINICAL DATA: Pt was helping her daughter move a refrigerator and
she fell out the door. Pain across her lower back but no radiating
pain, numbness or tingling. Pain in both knees on the anterior part
of her patella and medial to her patella. Initial encounter.

EXAM:
RIGHT KNEE - COMPLETE 4+ VIEW

[Series 3: t knee ap right · 0.14mm/px · 4 of 4 slices shown]
[im 1/4]
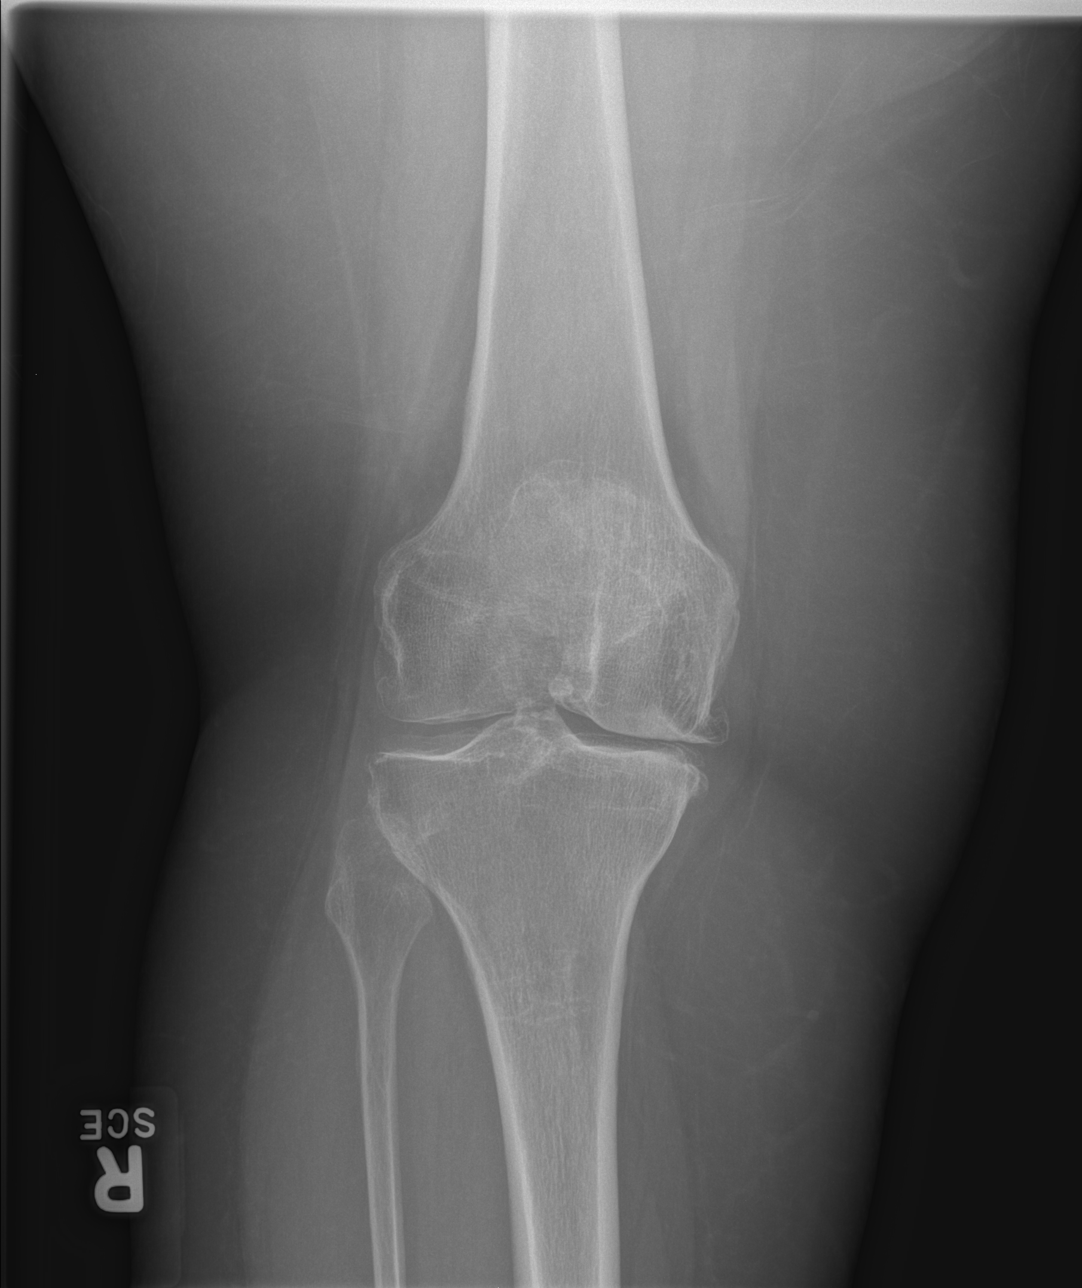
[im 2/4]
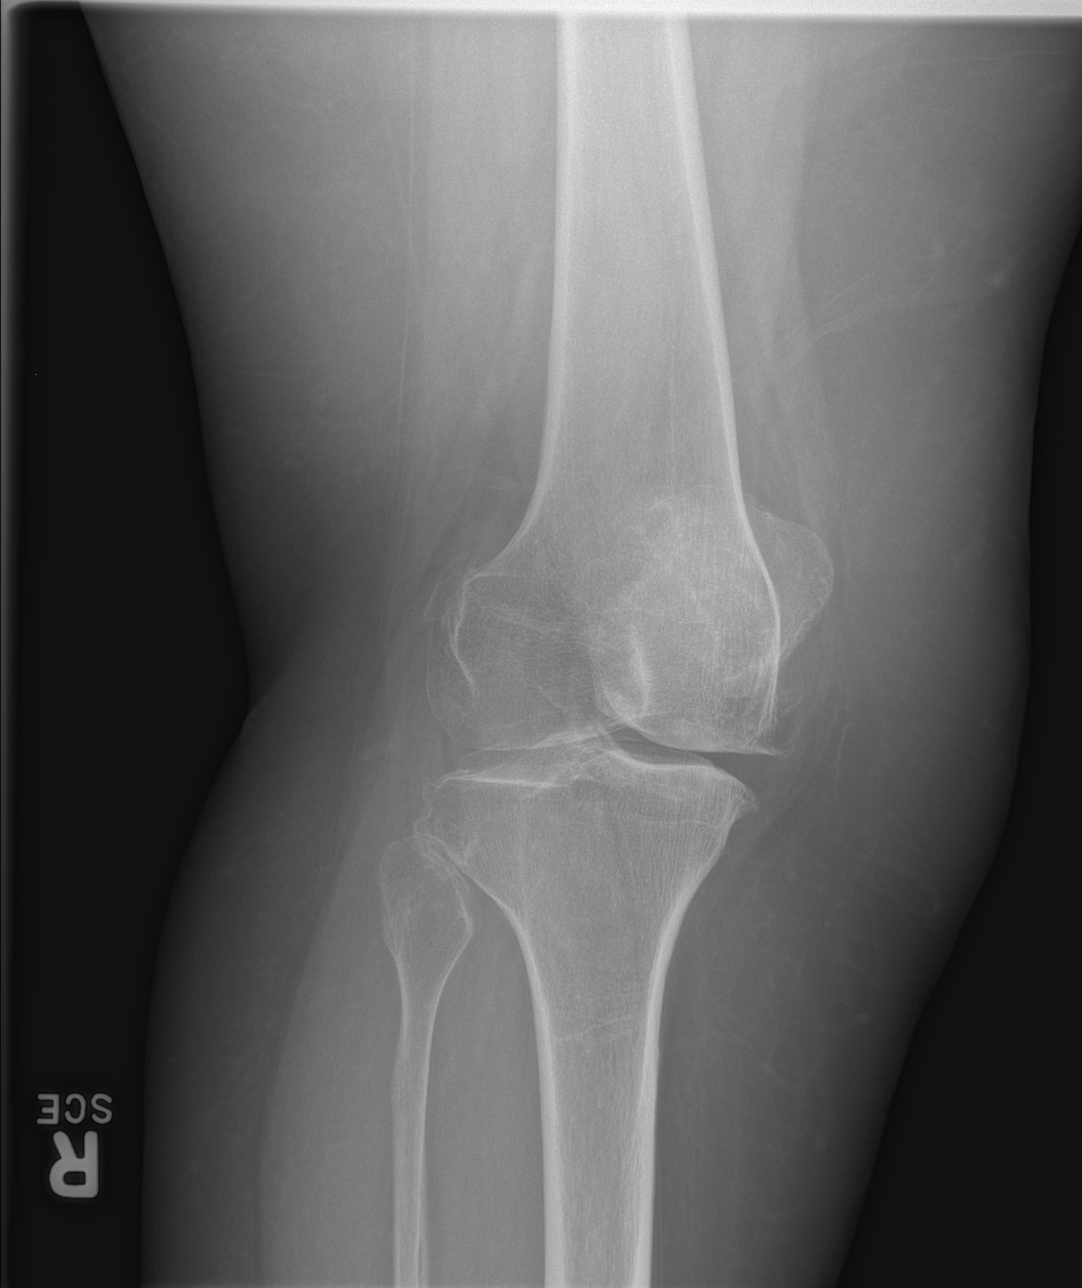
[im 3/4]
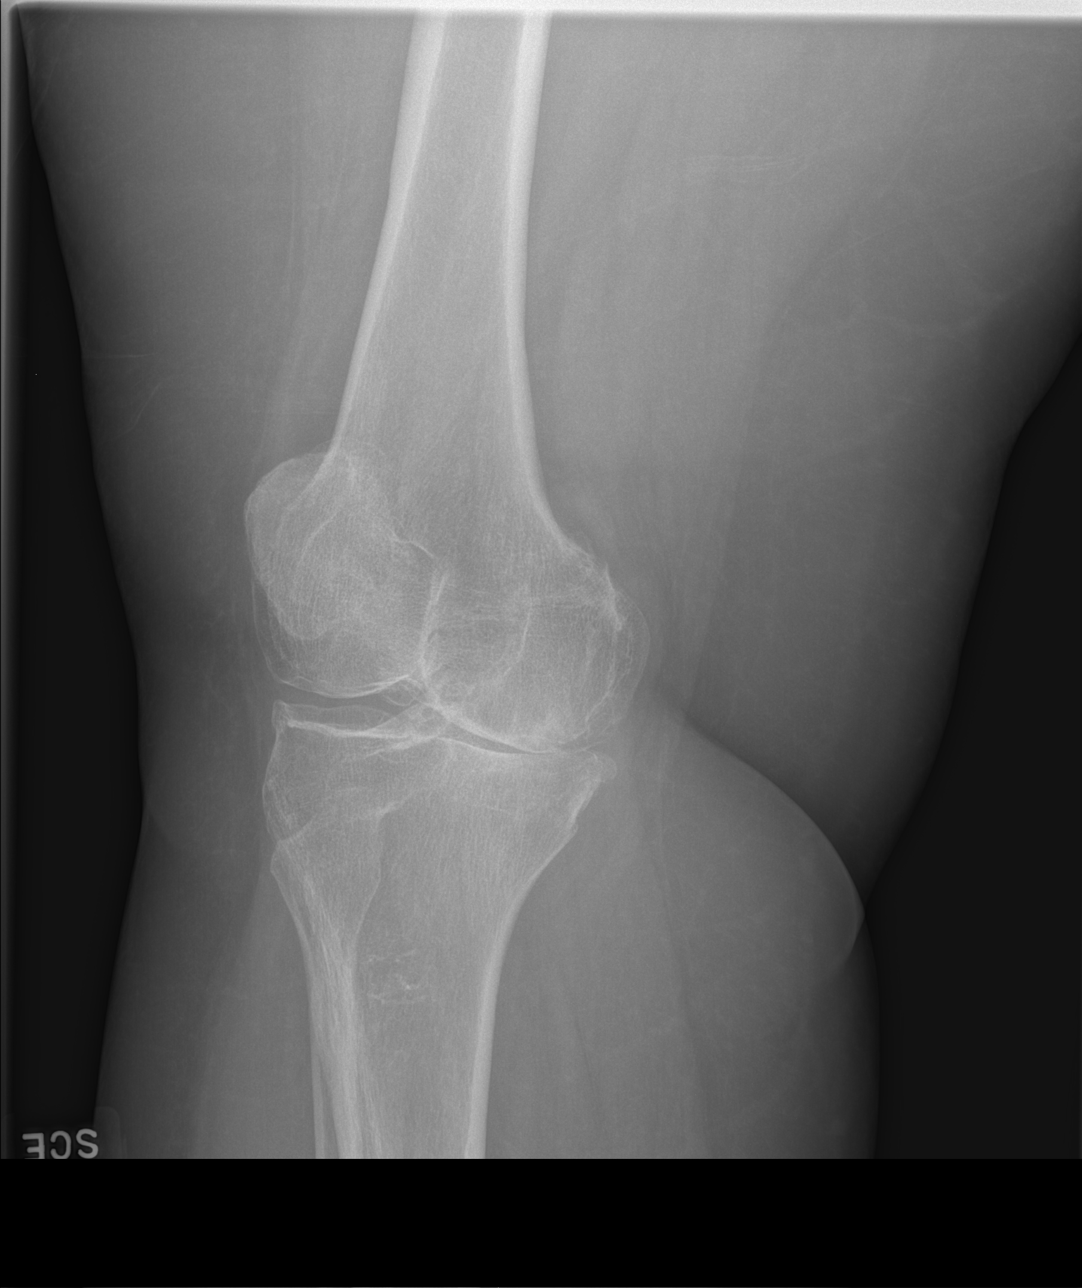
[im 4/4]
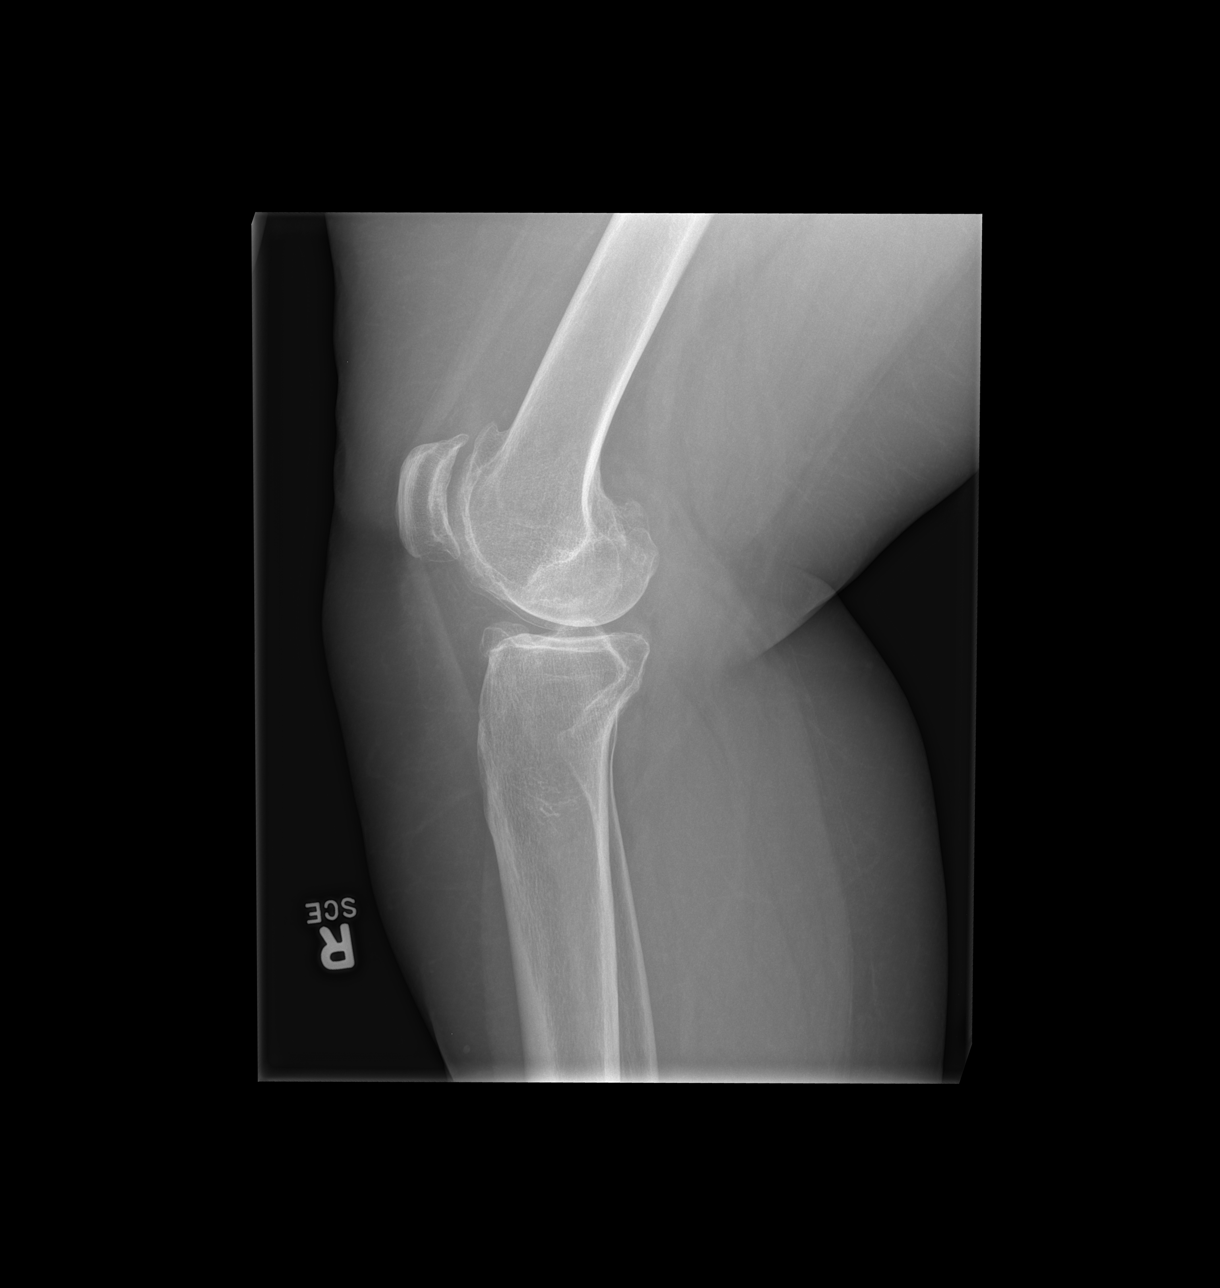

[4 of 4 positions shown; findings below may reference images not displayed]

FINDINGS: Normal anatomic alignment. No evidence for acute fracture
dislocation. Marked tricompartmental osteophytosis with medial
compartment joint space height loss. No joint effusion.
IMPRESSION: No acute osseous abnormality.  Tricompartmental osteoarthritis.

## 2017-05-05 IMAGING — CR DG LUMBAR SPINE 2-3V
1 series · 3 of 3 positions shown · non-contrast
Comparison: 07/27/2015.

CLINICAL DATA: Low back pain after lifting refrigerator.

EXAM:
LUMBAR SPINE - 2-3 VIEW

[Series 3: t lumbar spine lat · 0.14mm/px · 3 of 3 slices shown]
[im 1/3]
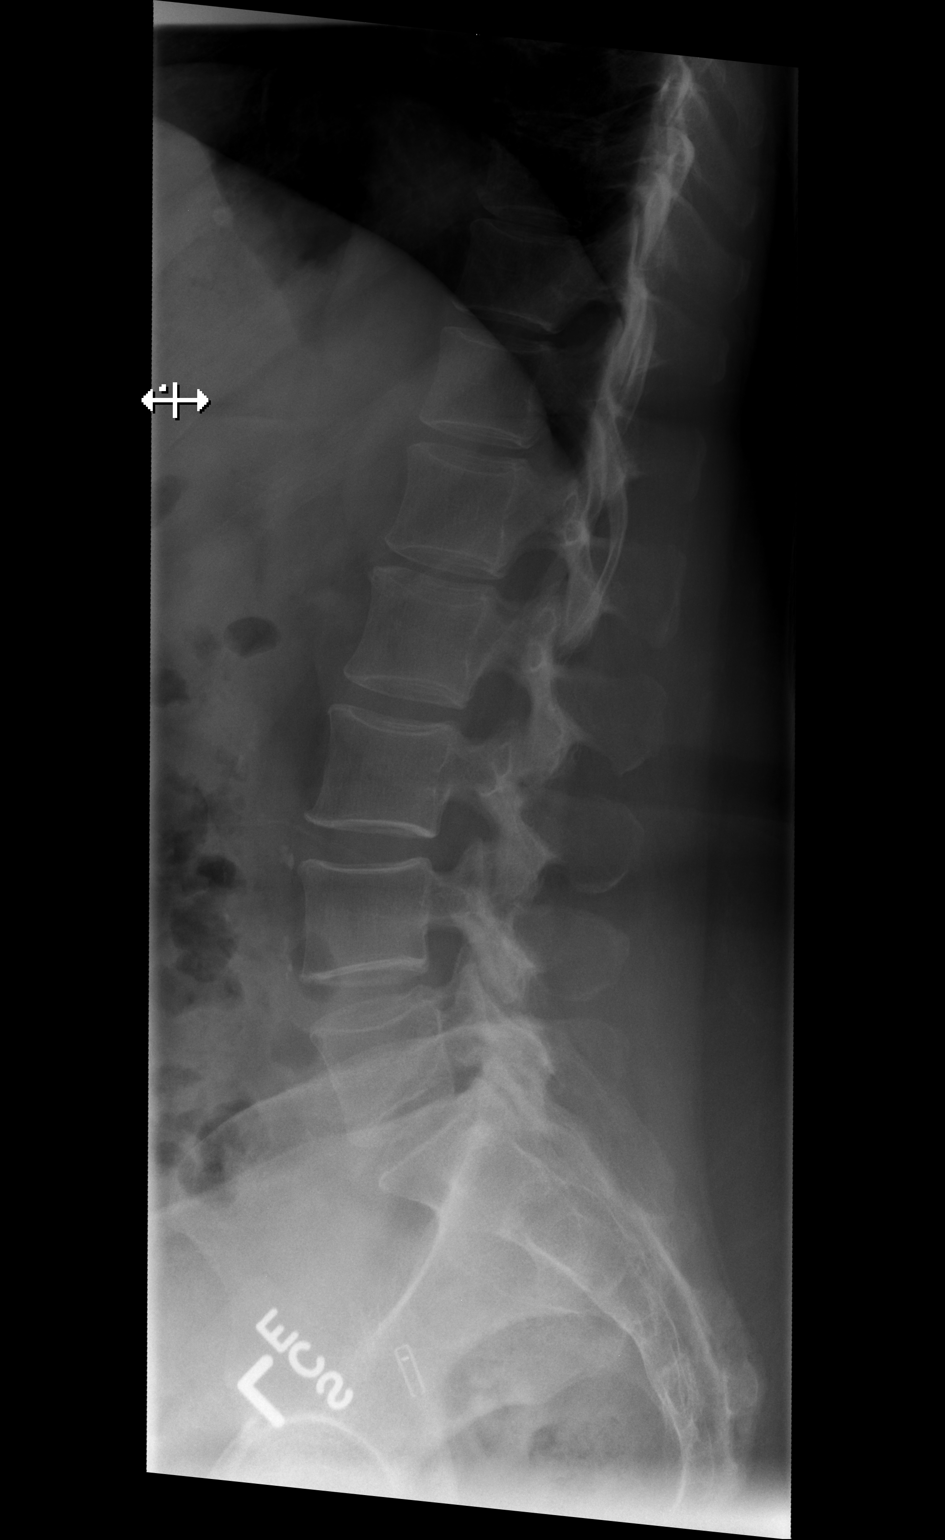
[im 2/3]
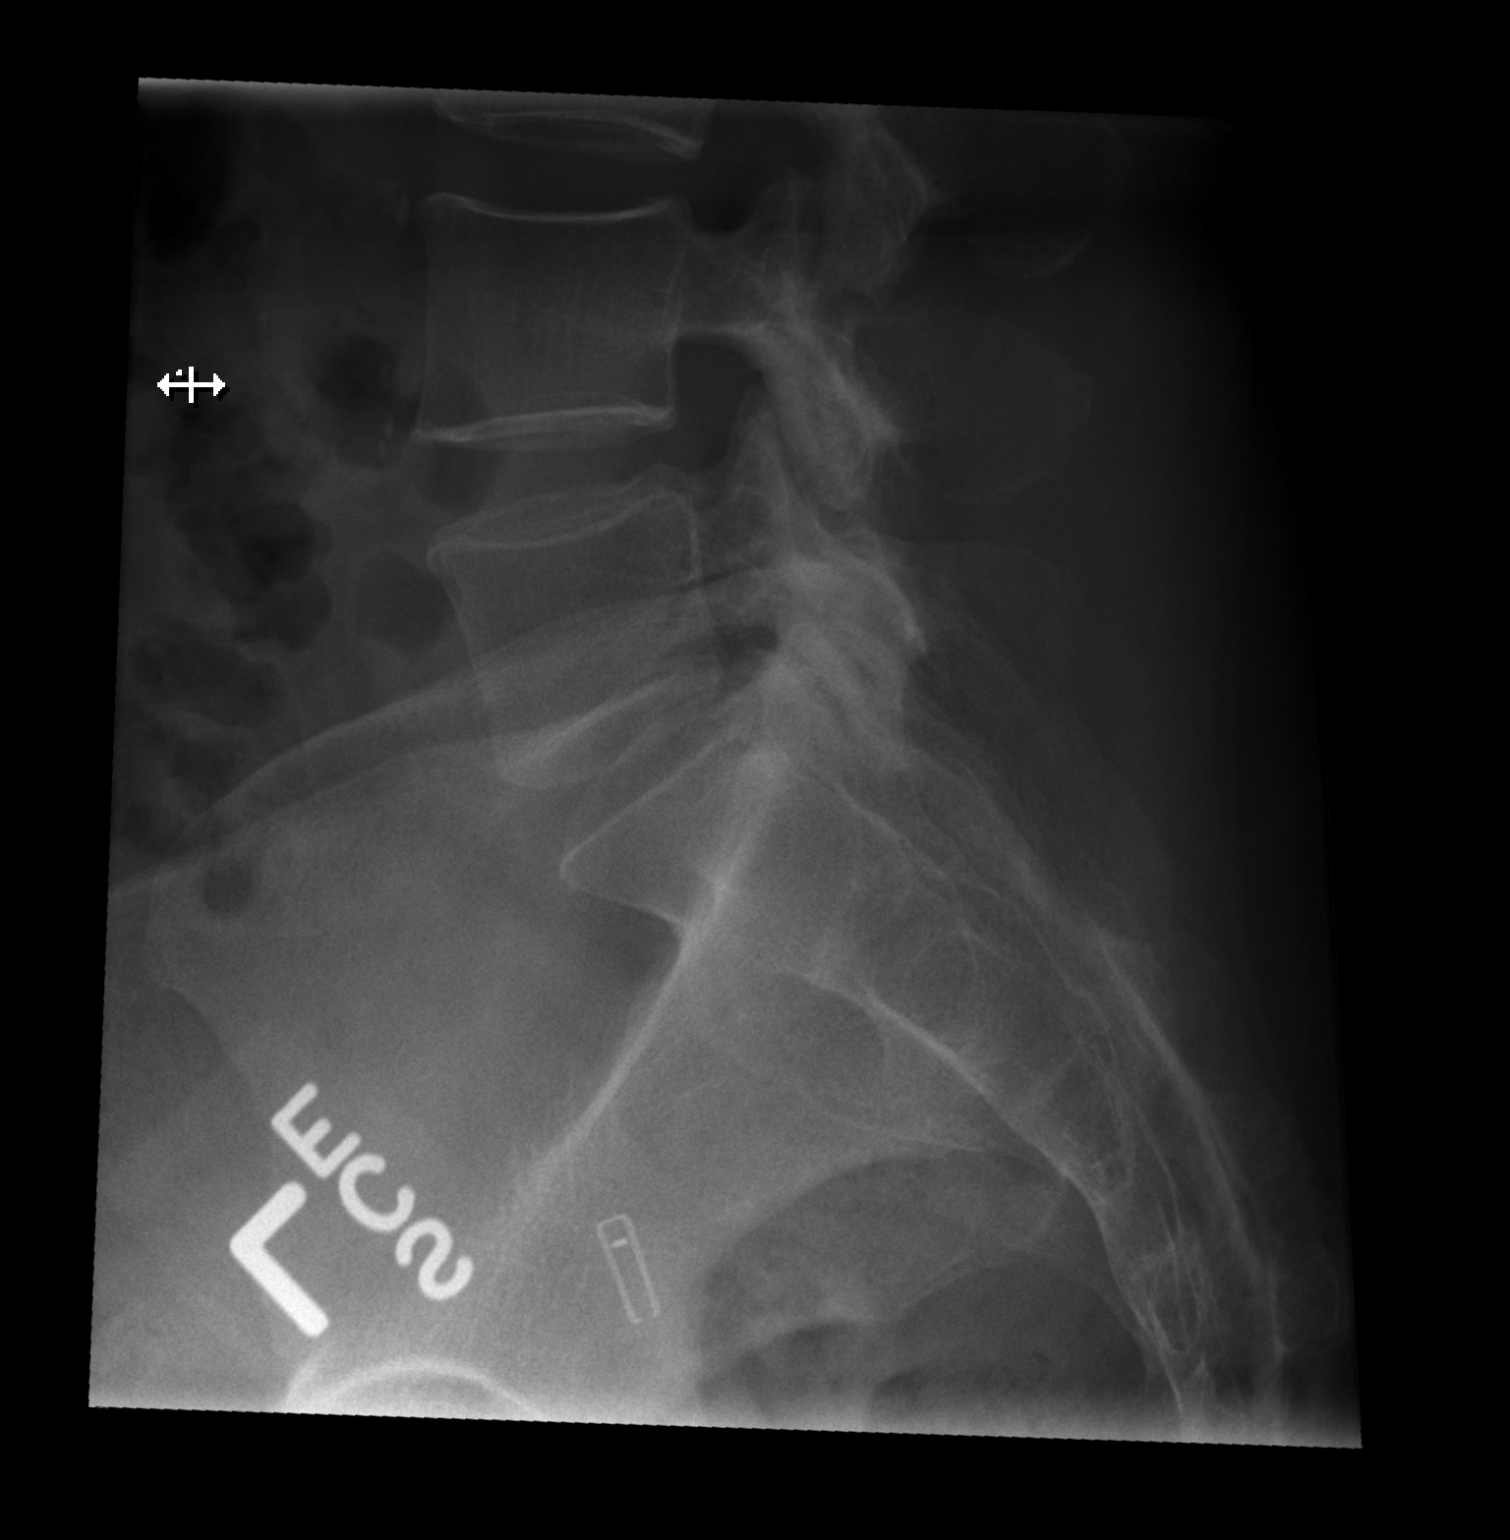
[im 3/3]
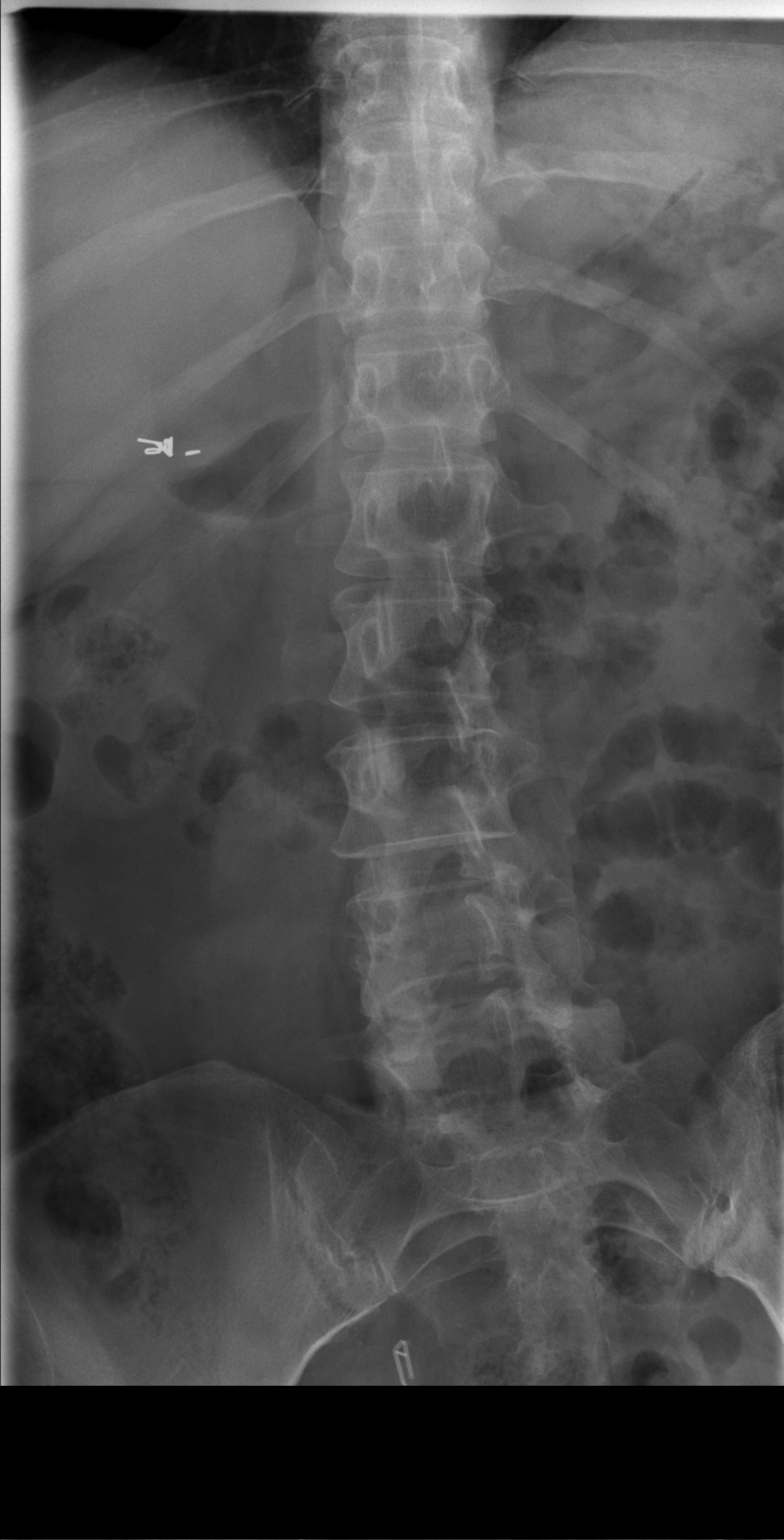

[3 of 3 positions shown; findings below may reference images not displayed]

FINDINGS: There is no evidence of lumbar spine fracture. Alignment is normal.
Intervertebral disc spaces are maintained.
IMPRESSION: Negative.

## 2017-05-05 IMAGING — CR DG KNEE COMPLETE 4+V*L*
1 series · 4 of 4 positions shown · non-contrast
Comparison: 07/27/2015

CLINICAL DATA: Fall.  Pain in both anterior knees.

EXAM:
LEFT KNEE - COMPLETE 4+ VIEW

[Series 3: t knee lat left · 0.14mm/px · 4 of 4 slices shown]
[im 1/4]
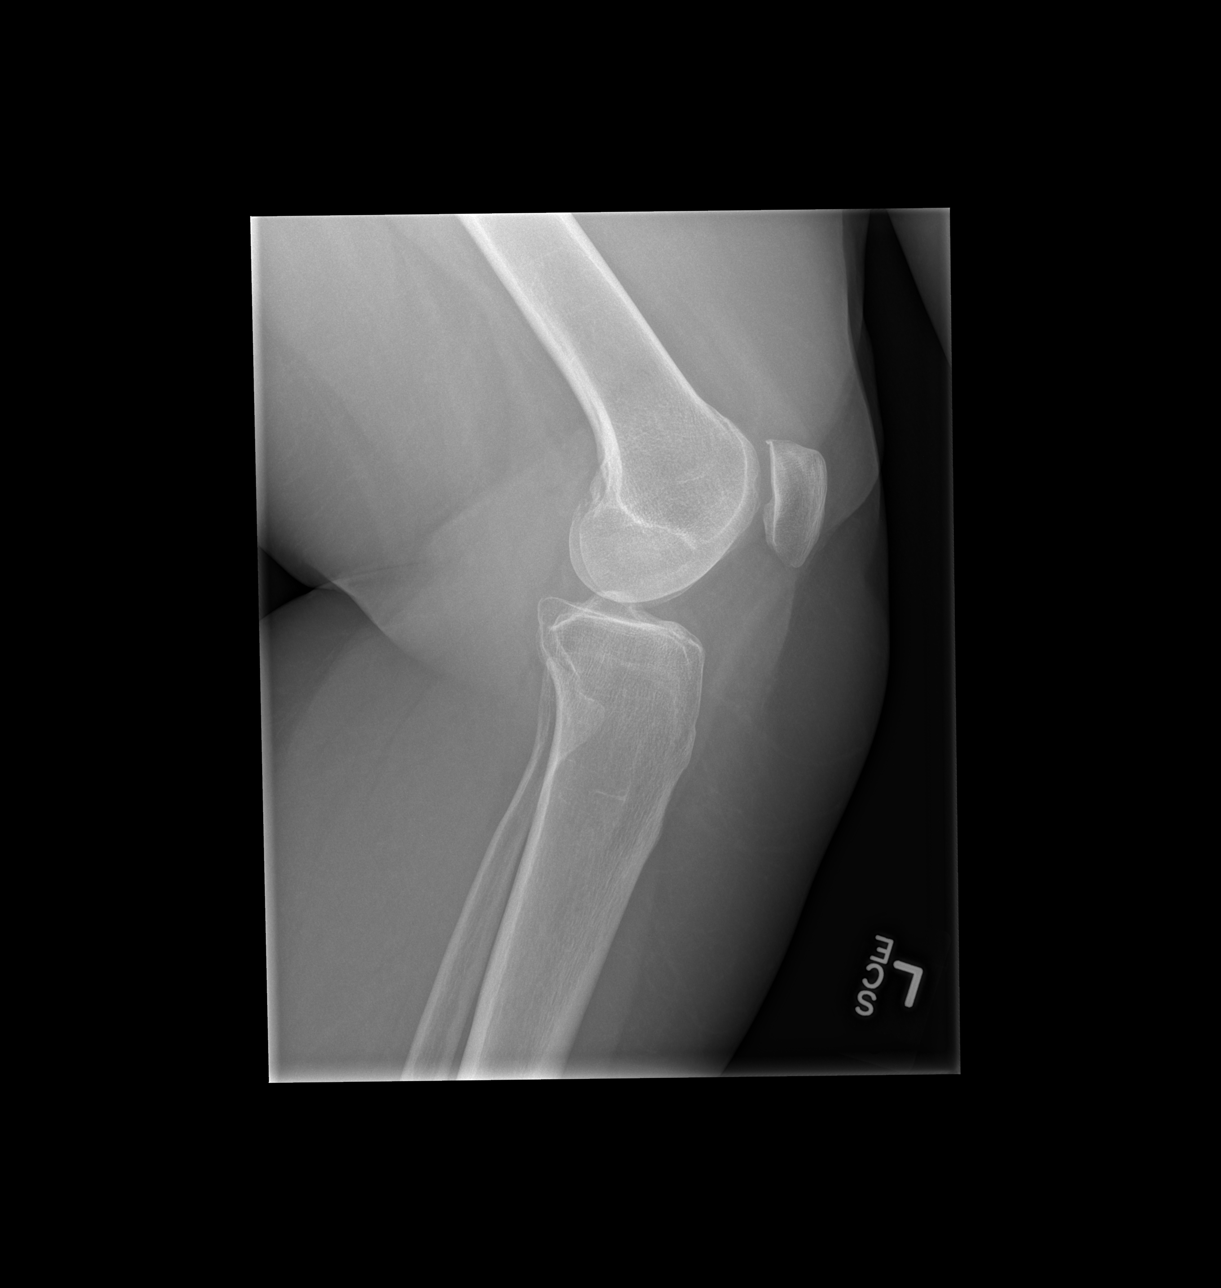
[im 2/4]
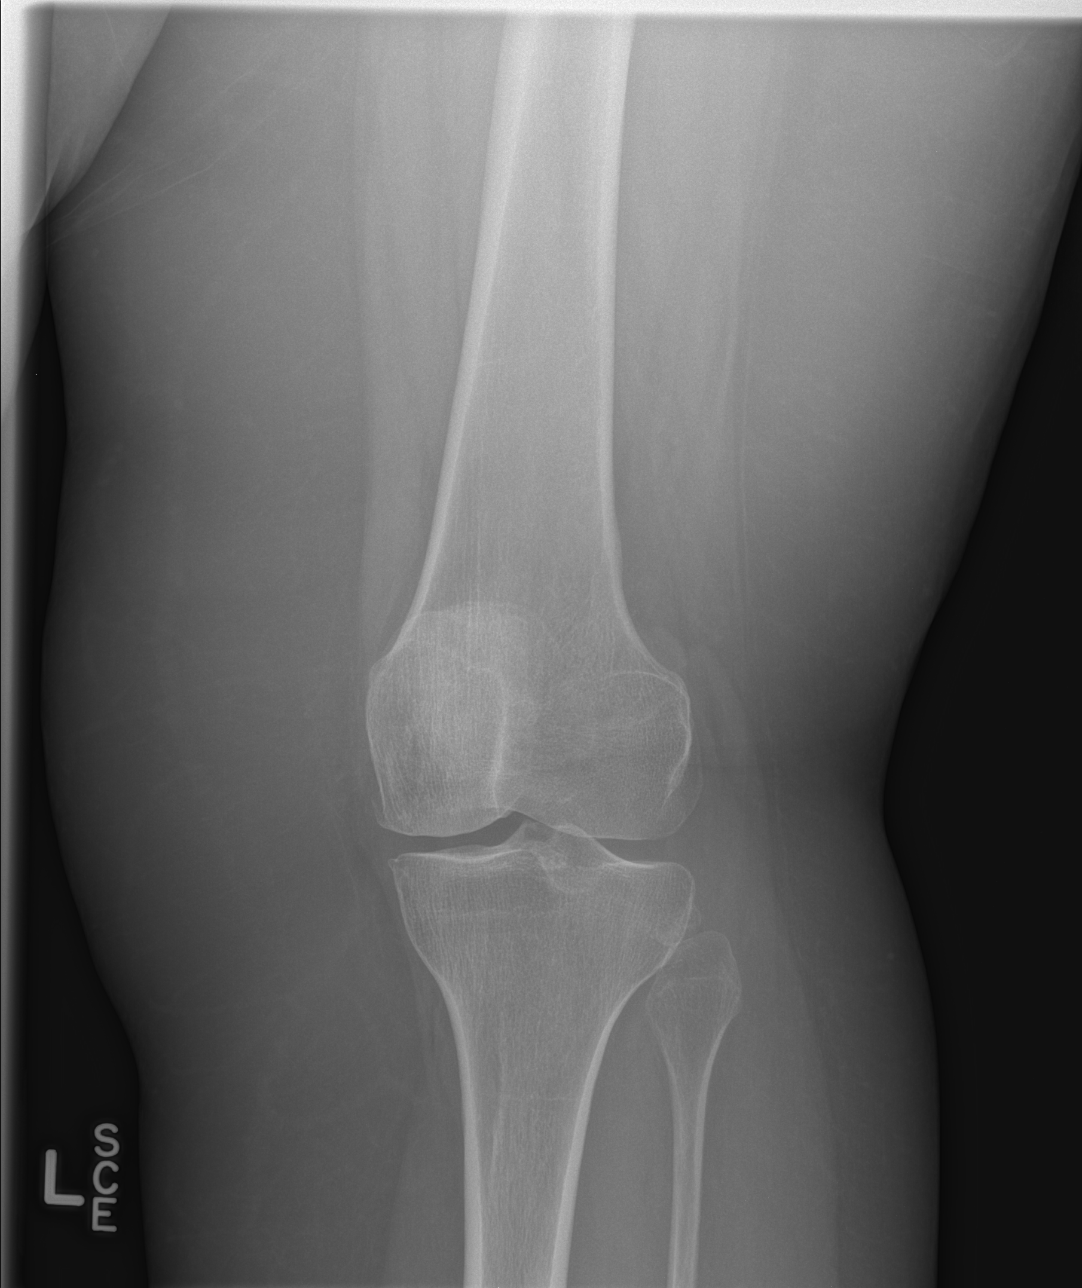
[im 3/4]
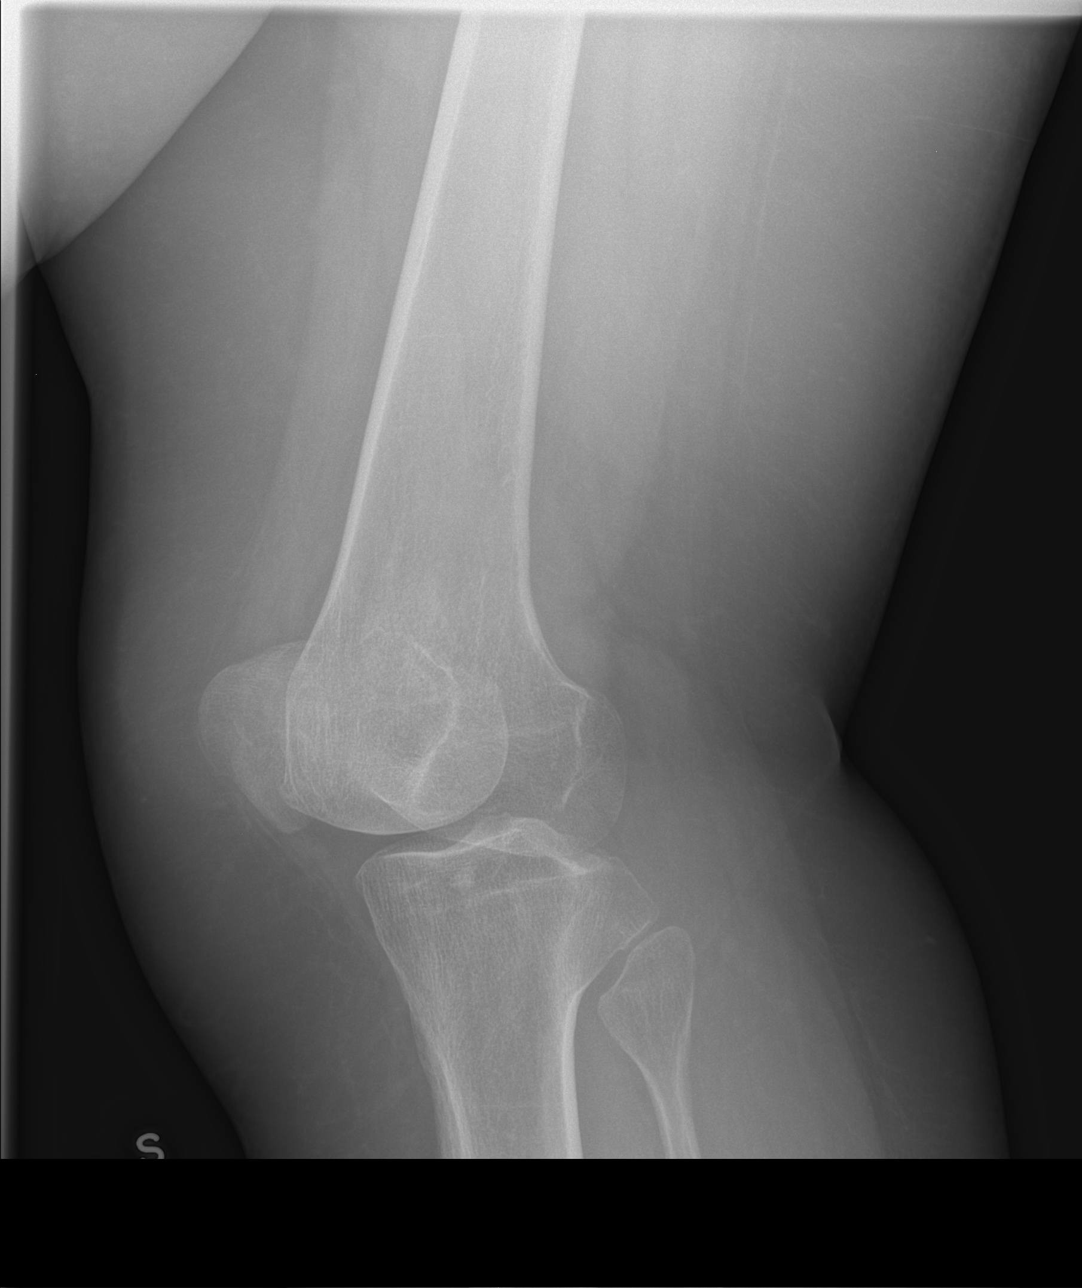
[im 4/4]
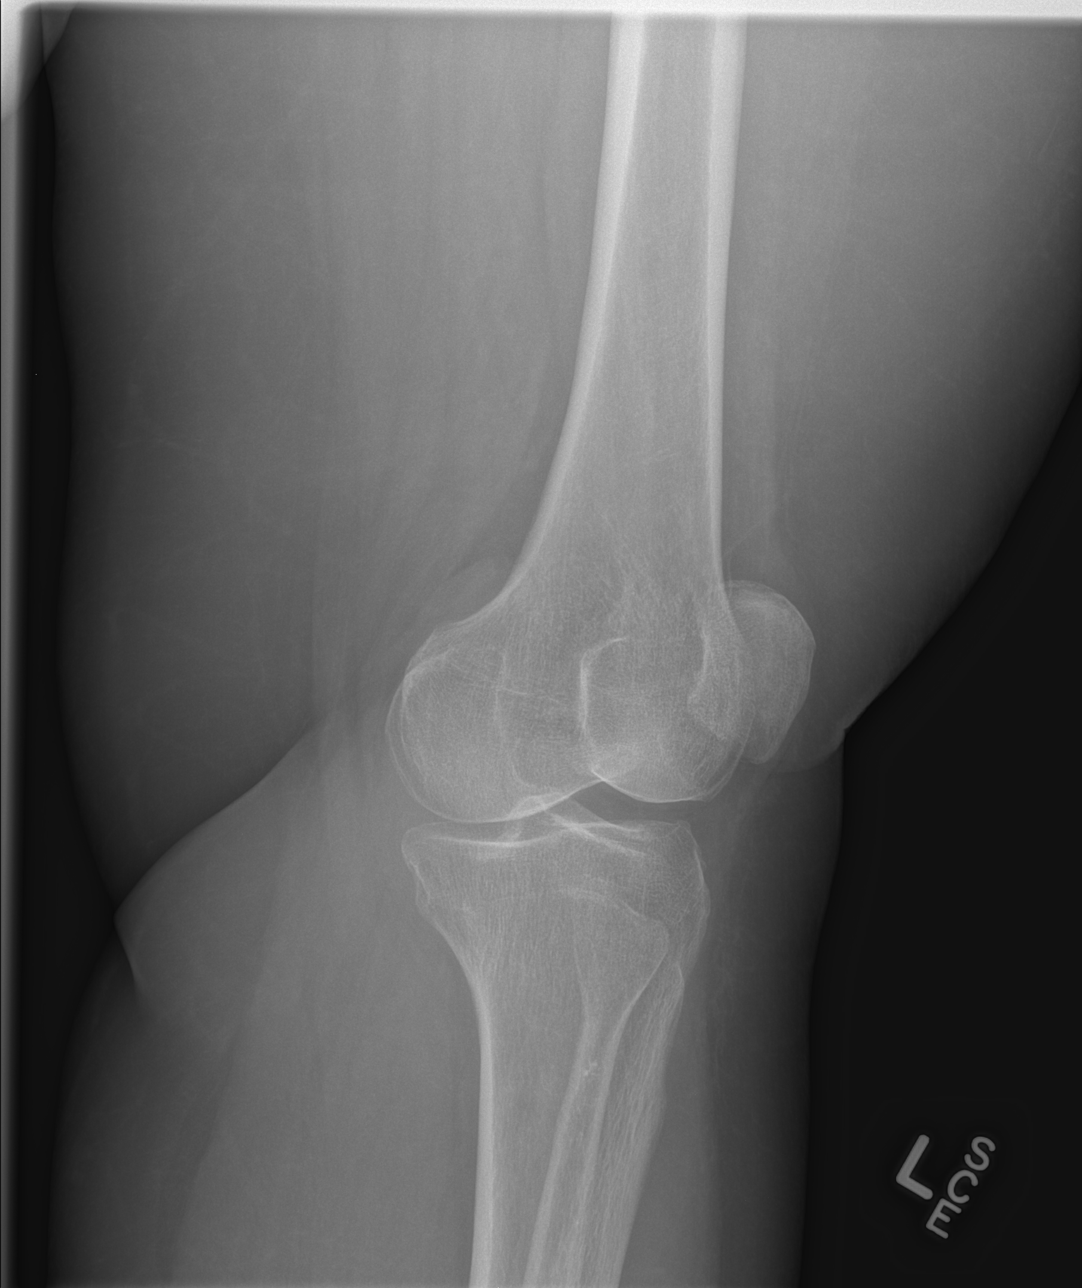

[4 of 4 positions shown; findings below may reference images not displayed]

FINDINGS: There is no evidence of fracture, dislocation, or joint effusion.
There is no evidence of arthropathy or other focal bone abnormality.
Soft tissues are unremarkable.
IMPRESSION: Negative.

## 2017-05-05 MED ORDER — MELOXICAM 7.5 MG PO TABS: 7.5000 mg | ORAL_TABLET | Freq: Every day | ORAL | 0 refills | Status: DC

## 2017-05-05 MED ORDER — PREDNISONE 10 MG PO TABS: ORAL_TABLET | ORAL | 0 refills | Status: DC

## 2017-05-05 MED ORDER — PREDNISONE 20 MG PO TABS
60.0000 mg | ORAL_TABLET | Freq: Once | ORAL | Status: AC
  Administered 2017-05-05: 60 mg via ORAL
  Filled 2017-05-05: qty 3

## 2017-05-05 MED ORDER — OXYCODONE-ACETAMINOPHEN 5-325 MG PO TABS
1.0000 | ORAL_TABLET | Freq: Once | ORAL | Status: AC
  Administered 2017-05-05: 1 via ORAL
  Filled 2017-05-05: qty 1

## 2017-05-05 NOTE — ED Provider Notes (Signed)
[facility] Emergency Department Provider Note  ____________________________________________  Time seen: Approximately 6:10 PM  I have reviewed the triage vital signs and the nursing notes.   HISTORY  Chief Complaint Leg Pain    HPI Carla [name] is a 56 y.o. female that presents to emergency department with right hip and right knee pain that worsened today. Pain is sharp in character. It is worse with walking. Patient states that she knows that she has osteoarthritis in her right knee. She states that it is "bone-on-bone." She is supposed to have a knee replacement surgery and is on the wait list for this. Pain has gradually been worsening in her knee and today started causing pain in her hip. She states that she walks differently to avoid the pain on that side. She takes tramadol twice daily for pain. She denies fever, shortness breath, chest pain, nausea, vomiting, abdominal pain, calf pain, numbness, tingling.   Past Medical History:  Diagnosis Date  . Bell's palsy   . GERD (gastroesophageal reflux disease)   . Hyperlipidemia   . Hypertension   . Morbid obesity with BMI of 40.0-44.9, adult [facility])     Patient Active Problem List   Diagnosis Date Noted  . Chest pain 08/06/2012    Past Surgical History:  Procedure Laterality Date  . CHOLECYSTECTOMY    . VAGINAL HYSTERECTOMY      Prior to Admission medications   Medication Sig Start Date End Date Taking? Authorizing Provider  gabapentin (NEURONTIN) 300 MG capsule Take 300 mg by mouth 2 (two) times daily.   Yes [provider]  traMADol (ULTRAM) 50 MG tablet Take 50 mg by mouth every 6 (six) hours as needed.   Yes [provider]  aspirin EC 81 MG tablet Take 81 mg by mouth daily.    [provider]  atorvastatin (LIPITOR) 40 MG tablet Take 40 mg by mouth at bedtime.     [provider]  benzonatate (TESSALON PERLES) 100 MG capsule Take 1 capsule (100 mg total) by  mouth 3 (three) times daily as needed for cough. 03/24/17   Sable Feil, PA-C  citalopram (CELEXA) 20 MG tablet Take 20 mg by mouth daily. Reported on 01/24/2016    [provider]  cyclobenzaprine (FLEXERIL) 5 MG tablet Take 1 tablet (5 mg total) by mouth 3 (three) times daily as needed for muscle spasms. 08/06/16   Menshew, Dannielle Karvonen, PA-C  diclofenac (VOLTAREN) 50 MG EC tablet Take 1 tablet (50 mg total) by mouth 2 (two) times daily. 10/25/16   Menshew, Dannielle Karvonen, PA-C  ketorolac (TORADOL) 10 MG tablet Take 1 tablet (10 mg total) by mouth every 8 (eight) hours. 08/06/16   Menshew, Dannielle Karvonen, PA-C  meloxicam (MOBIC) 7.5 MG tablet Take 1 tablet (7.5 mg total) by mouth daily. 05/05/17 05/05/18  Laban Emperor, PA-C  metoprolol tartrate (LOPRESSOR) 25 MG tablet Take 25 mg by mouth 2 (two) times daily.    [provider]  naproxen (NAPROSYN) 500 MG tablet Take 1 tablet (500 mg total) by mouth 2 (two) times daily with a meal. 08/03/16   Carrie Mew, MD  predniSONE (DELTASONE) 10 MG tablet Take 6 tablets on day 1, take 5 tablets on day 2, take 4 tablets on day 3, take 3 tablets on day 4, take 2 tablets on day 5, take 1 tablet on day 6 05/05/17   Laban Emperor, PA-C    Allergies Patient has no known allergies.  Family History  Problem Relation Age of Onset  . Hypertension Mother   . Heart disease Mother   . Heart attack Mother   . Breast cancer Mother   . Hypertension Father   . Breast cancer Paternal Aunt     Social History Social History  Substance Use Topics  . Smoking status: Current Some Day Smoker    Packs/day: 0.50    Types: Cigarettes  . Smokeless tobacco: Never Used  . Alcohol use No     Review of Systems  Constitutional: No fever/chills Cardiovascular: No chest pain. Respiratory: No SOB. Gastrointestinal: No abdominal pain.  No nausea, no vomiting.  Musculoskeletal: Positive for hip pain. Positive for knee pain. Skin: Negative for rash,  abrasions, lacerations, ecchymosis. Neurological: Negative for headaches, numbness or tingling   ____________________________________________   PHYSICAL EXAM:  VITAL SIGNS: ED Triage Vitals  Enc Vitals Group     BP 05/05/17 1626 (!) 162/99     Pulse Rate 05/05/17 1626 99     Resp 05/05/17 1626 18     Temp 05/05/17 1626 97.7 F (36.5 C)     Temp Source 05/05/17 1626 Oral     SpO2 05/05/17 1626 98 %     Weight 05/05/17 1627 237 lb (107.5 kg)     Height 05/05/17 1627 5\' 3"  (1.6 m)     Head Circumference --      Peak Flow --      Pain Score 05/05/17 1633 10     Pain Loc --      Pain Edu? --      Excl. in [place]? --      Constitutional: Alert and oriented. Well appearing and in no acute distress. Eyes: Conjunctivae are normal. PERRL. EOMI. Head: Atraumatic. ENT:      Ears:      Nose: No congestion/rhinnorhea.      Mouth/Throat: Mucous membranes are moist.  Neck: No stridor.  Cardiovascular: Normal rate, regular rhythm.  Good peripheral circulation. 2+ dorsalis pedis pulses. Respiratory: Normal respiratory effort without tachypnea or retractions. Lungs CTAB. Good air entry to the bases with no decreased or absent breath sounds. Gastrointestinal: Bowel sounds 4 quadrants. Soft and nontender to palpation. No guarding or rigidity. No palpable masses. No distention. Musculoskeletal: Full range of motion to all extremities. No gross deformities appreciated. Tenderness to palpation over her hip bursa. No tenderness to palpation over knee. No calf tenderness to palpation. No visible swelling. Neurologic:  Normal speech and language. No gross focal neurologic deficits are appreciated.  Skin:  Skin is warm, dry and intact. No rash noted.   ____________________________________________   LABS (all labs ordered are listed, but only abnormal results are displayed)  Labs Reviewed - No data to  display ____________________________________________  EKG   ____________________________________________  RADIOLOGY Robinette Haines, personally viewed and evaluated these images (plain radiographs) as part of my medical decision making, as well as reviewing the written report by the radiologist.  Dg Knee 2 Views Right  Result Date: 05/05/2017 CLINICAL DATA:  Pain in RIGHT knee, history of arthritis EXAM: RIGHT KNEE - 1-2 VIEW COMPARISON:  08/03/2016 FINDINGS: Osseous demineralization. Tricompartmental osteoarthritic changes with joint space narrowing and spur formation. No acute fracture, dislocation or bone destruction. No knee joint effusion. IMPRESSION: Osseous demineralization with tricompartmental osteoarthritic changes RIGHT knee. No acute abnormalities Electronically Signed   By: Lavonia Dana M.D.   On: 05/05/2017 19:27   Dg Hip Unilat With Pelvis 2-3 Views Right  Result Date: 05/05/2017  CLINICAL DATA:  RIGHT hip pain for 3 days EXAM: DG HIP (WITH OR WITHOUT PELVIS) 2-3V RIGHT COMPARISON:  None FINDINGS: Mild osseous demineralization. Hip and SI joint spaces symmetric and preserved. No acute fracture, dislocation, or bone destruction. Single surgical clip projects over pelvis. IMPRESSION: No acute osseous abnormalities. Electronically Signed   By: Lavonia Dana M.D.   On: 05/05/2017 19:26    ____________________________________________    PROCEDURES  Procedure(s) performed:    Procedures    Medications  oxyCODONE-acetaminophen (PERCOCET/ROXICET) 5-325 MG per tablet 1 tablet (1 tablet Oral Given 05/05/17 1831)  predniSONE (DELTASONE) tablet 60 mg (60 mg Oral Given 05/05/17 2014)     ____________________________________________   INITIAL IMPRESSION / ASSESSMENT AND PLAN / ED COURSE  Pertinent labs & imaging results that were available during my care of the patient were reviewed by me and considered in my medical decision making (see chart for details).  Review of the [place]  CSRS was performed in accordance of the [place] prior to dispensing any controlled drugs.   Patient presented to the emergency department for evaluation of right hip and knee pain. Vital signs and exam are reassuring. No acute bony abnormalities on hip or knee x-ray. Hip pain is likely because patient has been walking differently due to knee pain. Pain improved with percocet in ED. She takes tramadol daily for pain. No calf pain or leg swelling. Patient will be discharged home with prescriptions for prednisone and meloxicam. Patient is to follow up with PCP as directed. Patient is given ED precautions to return to the ED for any worsening or new symptoms.     ____________________________________________  FINAL CLINICAL IMPRESSION(S) / ED DIAGNOSES  Final diagnoses:  Osteoarthritis of right knee, unspecified osteoarthritis type  Osteopenia of right hip      NEW MEDICATIONS STARTED DURING THIS VISIT:  Discharge Medication List as of 05/05/2017  8:13 PM    START taking these medications   Details  meloxicam (MOBIC) 7.5 MG tablet Take 1 tablet (7.5 mg total) by mouth daily., Starting Sat 05/05/2017, Until Sun 05/05/2018, Print    predniSONE (DELTASONE) 10 MG tablet Take 6 tablets on day 1, take 5 tablets on day 2, take 4 tablets on day 3, take 3 tablets on day 4, take 2 tablets on day 5, take 1 tablet on day 6, Print            This chart was dictated using voice recognition software/Dragon. Despite best efforts to proofread, errors can occur which can change the meaning. Any change was purely unintentional.    Laban Emperor, PA-C 05/06/17 1544    Nance Pear, MD 05/06/17 [phone]

## 2017-05-05 NOTE — ED Notes (Signed)
See triage note  States she is having increased pain to right knee  Hx of arthritis to same  Denies any new injury  Ambulates with sl limp d/t pain

## 2017-05-05 NOTE — ED Notes (Signed)
Patient calling out asking about results.  Informed patient per provider that the provider will come update patient in about 10 minutes with results.

## 2017-05-05 NOTE — ED Triage Notes (Addendum)
Pt reports arthritis to right knee at baseline (takes gabapentin and tramadol for it) but normal meds are not helping.  C/o arthritis getting worse and meds not helping; feels same just increase in pain.  Also pain to right hip.  Pain worse to both with ambulation and sitting.  Pain eases most when laying down.  Pt supposed to have knee replacement soon, reports it is bone on bone

## 2017-05-16 ENCOUNTER — Emergency Department: Payer: Medicaid Other

## 2017-05-16 ENCOUNTER — Emergency Department
Admission: EM | Admit: 2017-05-16 | Discharge: 2017-05-16 | Disposition: A | Discharge status: Home / Self Care | Payer: Medicaid Other | Attending: Emergency Medicine | Admitting: Emergency Medicine

## 2017-05-16 DIAGNOSIS — M5441 Lumbago with sciatica, right side: Secondary | ICD-10-CM | POA: Insufficient documentation

## 2017-05-16 DIAGNOSIS — I10 Essential (primary) hypertension: Secondary | ICD-10-CM | POA: Diagnosis not present

## 2017-05-16 DIAGNOSIS — F17210 Nicotine dependence, cigarettes, uncomplicated: Secondary | ICD-10-CM | POA: Insufficient documentation

## 2017-05-16 DIAGNOSIS — G8929 Other chronic pain: Secondary | ICD-10-CM | POA: Insufficient documentation

## 2017-05-16 DIAGNOSIS — Z79899 Other long term (current) drug therapy: Secondary | ICD-10-CM | POA: Insufficient documentation

## 2017-05-16 DIAGNOSIS — M549 Dorsalgia, unspecified: Secondary | ICD-10-CM | POA: Diagnosis present

## 2017-05-16 DIAGNOSIS — Z7982 Long term (current) use of aspirin: Secondary | ICD-10-CM | POA: Insufficient documentation

## 2017-05-16 MED ORDER — CYCLOBENZAPRINE HCL 10 MG PO TABS: 10.0000 mg | ORAL_TABLET | Freq: Three times a day (TID) | ORAL | 0 refills | Status: DC | PRN

## 2017-05-16 MED ORDER — METHYLPREDNISOLONE SODIUM SUCC 125 MG IJ SOLR
125.0000 mg | Freq: Once | INTRAMUSCULAR | Status: AC
  Administered 2017-05-16: 125 mg via INTRAMUSCULAR
  Filled 2017-05-16: qty 2

## 2017-05-16 MED ORDER — ORPHENADRINE CITRATE 30 MG/ML IJ SOLN: 60.0000 mg | Freq: Two times a day (BID) | INTRAMUSCULAR | Status: DC

## 2017-05-16 MED ORDER — METHYLPREDNISOLONE 4 MG PO TBPK: ORAL_TABLET | ORAL | 0 refills | Status: DC

## 2017-05-16 NOTE — ED Triage Notes (Signed)
Pt reports lower back pain with radiation into right leg that started 3 days ago.

## 2017-05-16 NOTE — ED Provider Notes (Signed)
[facility] Emergency Department Provider Note   ____________________________________________   First MD Initiated Contact with Patient 05/16/17 0732     (approximate)  I have reviewed the triage vital signs and the nursing notes.   HISTORY  Chief Complaint Back Pain    HPI Carla [name] is a 56 y.o. female patient complaining of low back 10 vertical component to the right lower extremity for 3 days. Patient denies bladder or bowel dysfunction. Patient denies numbness or tenderness to the lower extremities. Patient has a history of arthritis with degenerative changes noticed on x-ray of the lumbar spine last year.He wasin blood pressures elevated upon arrival. Patient state took a.m. blood pressure medication prior to arrival. No palliative measures for back pain. Patient rates pain as a 10 over 10. Patient described the pain as "achy".  Past Medical History:  Diagnosis Date  . Bell's palsy   . GERD (gastroesophageal reflux disease)   . Hyperlipidemia   . Hypertension   . Morbid obesity with BMI of 40.0-44.9, adult [facility])     Patient Active Problem List   Diagnosis Date Noted  . Chest pain 08/06/2012    Past Surgical History:  Procedure Laterality Date  . CHOLECYSTECTOMY    . VAGINAL HYSTERECTOMY      Prior to Admission medications   Medication Sig Start Date End Date Taking? Authorizing Provider  aspirin EC 81 MG tablet Take 81 mg by mouth daily.    [provider]  atorvastatin (LIPITOR) 40 MG tablet Take 40 mg by mouth at bedtime.     [provider]  benzonatate (TESSALON PERLES) 100 MG capsule Take 1 capsule (100 mg total) by mouth 3 (three) times daily as needed for cough. 03/24/17   Sable Feil, PA-C  citalopram (CELEXA) 20 MG tablet Take 20 mg by mouth daily. Reported on 01/24/2016    [provider]  cyclobenzaprine (FLEXERIL) 10 MG tablet Take 1 tablet (10 mg total) by mouth 3 (three) times daily as needed.  05/16/17   Sable Feil, PA-C  cyclobenzaprine (FLEXERIL) 5 MG tablet Take 1 tablet (5 mg total) by mouth 3 (three) times daily as needed for muscle spasms. 08/06/16   Menshew, Dannielle Karvonen, PA-C  diclofenac (VOLTAREN) 50 MG EC tablet Take 1 tablet (50 mg total) by mouth 2 (two) times daily. 10/25/16   Menshew, Dannielle Karvonen, PA-C  gabapentin (NEURONTIN) 300 MG capsule Take 300 mg by mouth 2 (two) times daily.    [provider]  ketorolac (TORADOL) 10 MG tablet Take 1 tablet (10 mg total) by mouth every 8 (eight) hours. 08/06/16   Menshew, Dannielle Karvonen, PA-C  meloxicam (MOBIC) 7.5 MG tablet Take 1 tablet (7.5 mg total) by mouth daily. 05/05/17 05/05/18  Laban Emperor, PA-C  methylPREDNISolone (MEDROL DOSEPAK) 4 MG TBPK tablet Take Tapered dose as directed 05/16/17   Sable Feil, PA-C  metoprolol tartrate (LOPRESSOR) 25 MG tablet Take 25 mg by mouth 2 (two) times daily.    [provider]  naproxen (NAPROSYN) 500 MG tablet Take 1 tablet (500 mg total) by mouth 2 (two) times daily with a meal. 08/03/16   Carrie Mew, MD  predniSONE (DELTASONE) 10 MG tablet Take 6 tablets on day 1, take 5 tablets on day 2, take 4 tablets on day 3, take 3 tablets on day 4, take 2 tablets on day 5, take 1 tablet on day 6 05/05/17   Laban Emperor, PA-C  traMADol (  ULTRAM) 50 MG tablet Take 50 mg by mouth every 6 (six) hours as needed.    [provider]    Allergies Patient has no known allergies.  Family History  Problem Relation Age of Onset  . Hypertension Mother   . Heart disease Mother   . Heart attack Mother   . Breast cancer Mother   . Hypertension Father   . Breast cancer Paternal Aunt     Social History Social History  Substance Use Topics  . Smoking status: Current Some Day Smoker    Packs/day: 0.50    Types: Cigarettes  . Smokeless tobacco: Never Used  . Alcohol use No    Review of Systems Constitutional: No fever/chills Eyes: No visual changes. ENT:  No sore throat. Cardiovascular: Denies chest pain. Respiratory: Denies shortness of breath. Gastrointestinal: No abdominal pain.  No nausea, no vomiting.  No diarrhea.  No constipation. Genitourinary: Negative for dysuria. Musculoskeletal: Positive for back pain  Skin: Negative for rash. Neurological: Negative for headaches, focal weakness or numbness. Endocrine:Hypertension hyperlipidemia    ____________________________________________   PHYSICAL EXAM:  VITAL SIGNS: ED Triage Vitals  Enc Vitals Group     BP 05/16/17 0721 (!) 191/113     Pulse Rate 05/16/17 0721 98     Resp 05/16/17 0721 18     Temp 05/16/17 0721 97.9 F (36.6 C)     Temp Source 05/16/17 0721 Oral     SpO2 05/16/17 0721 99 %     Weight 05/16/17 0718 237 lb (107.5 kg)     Height 05/16/17 0718 5\' 3"  (1.6 m)     Head Circumference --      Peak Flow --      Pain Score 05/16/17 0718 10     Pain Loc --      Pain Edu? --      Excl. in [place]? --     Constitutional: Alert and oriented. Well appearing and in no acute distress. Cardiovascular: Normal rate, regular rhythm. Grossly normal heart sounds.  Good peripheral circulation.Elevated blood pressure Respiratory: Normal respiratory effort.  No retractions. Lungs CTAB. Gastrointestinal: Soft and nontender. No distention. No abdominal bruits. No CVA tenderness. Musculoskeletal: No lower extremity tenderness nor edema.  No joint effusions. Neurologic:  Normal speech and language. No gross focal neurologic deficits are appreciated. No gait instability. Skin:  Skin is warm, dry and intact. No rash noted. Psychiatric: Mood and affect are normal. Speech and behavior are normal.  ____________________________________________   LABS (all labs ordered are listed, but only abnormal results are displayed)  Labs Reviewed - No data to display ____________________________________________  EKG   ____________________________________________  RADIOLOGY  Dg Lumbar Spine  Complete  Result Date: 05/16/2017 CLINICAL DATA:  Low back pain with radiation to the right leg beginning 3 days ago. No known injury. EXAM: LUMBAR SPINE - COMPLETE 4+ VIEW COMPARISON:  08/03/2016; 11/20/2015 ; CT abdomen pelvis - 04/19/2015 FINDINGS: There are 5 non rib-bearing lumbar type vertebral bodies. There is a mild scoliotic curvature of the thoracolumbar spine with dominant caudal component convex the right measuring approximately 6 degrees (as measured from the superior endplate of L2 to the inferior endplate of L4). No anterolisthesis or retrolisthesis. No definite pars defects. Lumbar vertebral body heights appear preserved. Mild to moderate multilevel lumbar spine DDD, likely worse at L1-L2 with disc space height loss, endplate irregularity and sclerosis Limited visualization of the bilateral SI joints is normal. Post cholecystectomy. A tubal ligation clip versus a displaced cholecystectomy  clip overlies the right hemipelvis, unchanged. Moderate colonic stool burden without evidence of enteric obstruction. IMPRESSION: 1. Stable examination without interval change. 2. Mild scoliotic curvature of the thoracolumbar spine with associated mild to moderate multilevel lumbar spine DDD, likely worse at L1-L2. Electronically Signed   By: Sandi Mariscal M.D.   On: 05/16/2017 08:07    ______Increasing degenerative disc disease of the lumbar spine since previous x-ray/ear. ______________________________________   PROCEDURES  Procedure(s) performed: None  Procedures  Critical Care performed: No  ____________________________________________   INITIAL IMPRESSION / ASSESSMENT AND PLAN / ED COURSE  Pertinent labs & imaging results that were available during my care of the patient were reviewed by me and considered in my medical decision making (see chart for details).  Radicular back pain secondary to degenerative disc disease. Discuss x-ray finding with patient. Patient given discharge care  instructions. Patient advised follow her treating orthopedic doctor for continued care.      ____________________________________________   FINAL CLINICAL IMPRESSION(S) / ED DIAGNOSES  Final diagnoses:  Chronic low back pain with right-sided sciatica, unspecified back pain laterality      NEW MEDICATIONS STARTED DURING THIS VISIT:  New Prescriptions   CYCLOBENZAPRINE (FLEXERIL) 10 MG TABLET    Take 1 tablet (10 mg total) by mouth 3 (three) times daily as needed.   METHYLPREDNISOLONE (MEDROL DOSEPAK) 4 MG TBPK TABLET    Take Tapered dose as directed     Note:  This document was prepared using Dragon voice recognition software and may include unintentional dictation errors.    Sable Feil, PA-C 05/16/17 [id number]    Carrie Mew, MD 05/18/17 2018

## 2017-05-16 NOTE — ED Notes (Signed)
Waiting on pts driver (daughter) to arrive before giving meds.

## 2017-05-16 NOTE — ED Notes (Signed)
See triage note. Pain 10/10 lower back and down right leg. Acute on chronic pain.  Per Pt, she has been taking multiple antiinflammatory meds with no relief. None this am.

## 2017-06-11 IMAGING — CR DG KNEE COMPLETE 4+V*R*
1 series · 4 of 4 positions shown · non-contrast
Comparison: Radiographs 11/20/2015 and 07/27/2015.

CLINICAL DATA: Knee pain since falling approximately 2 hours ago.

EXAM:
RIGHT KNEE - COMPLETE 4+ VIEW

[Series 1: dg knee complete 4 views right · 0.14mm/px · 4 of 4 slices shown]
[im 1/4]
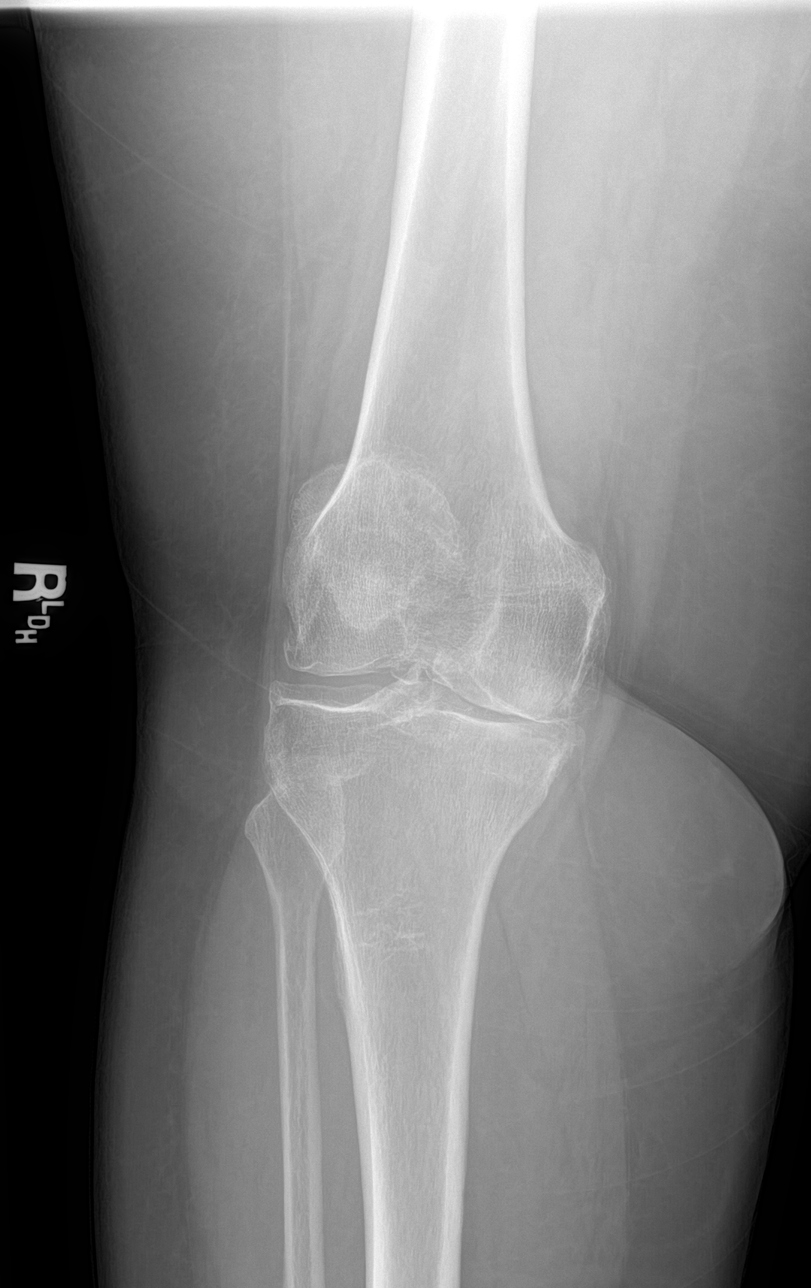
[im 2/4]
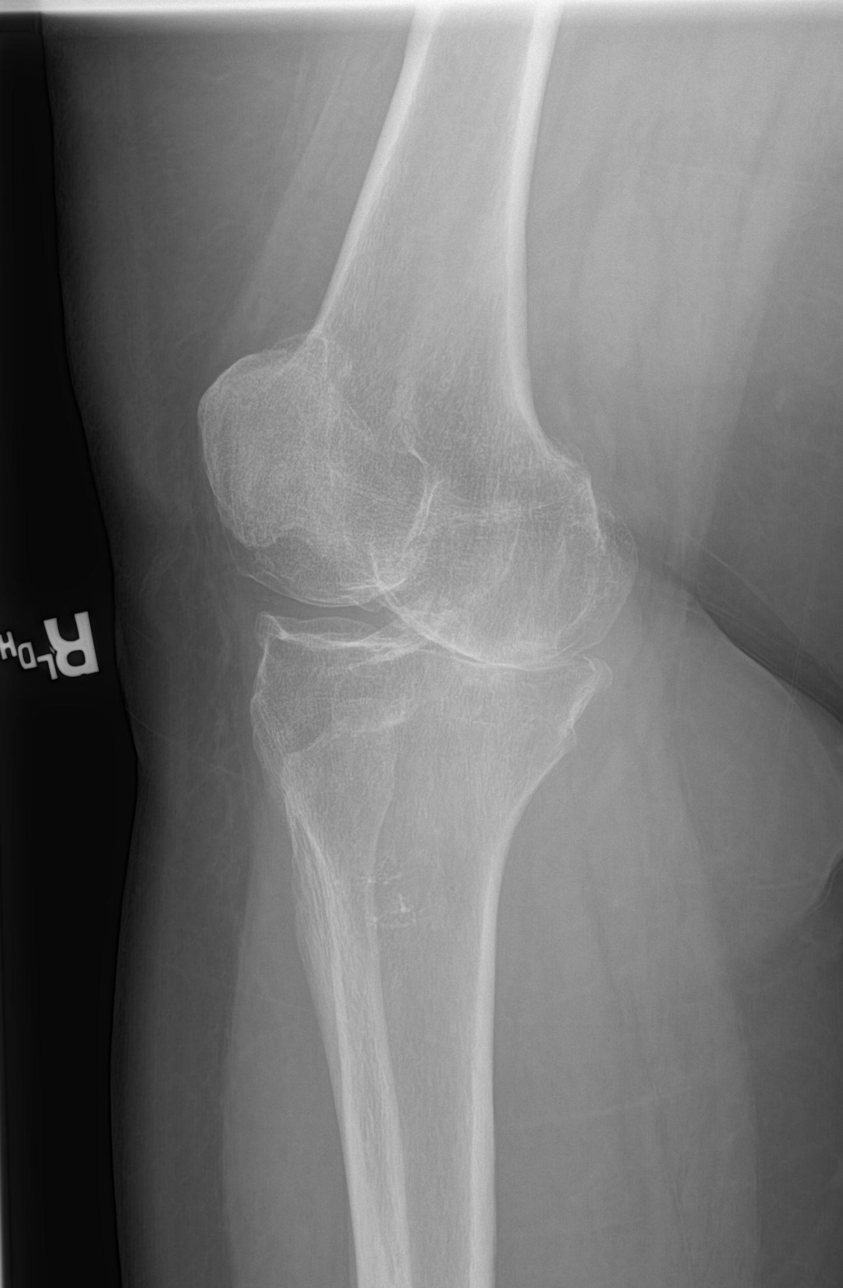
[im 3/4]
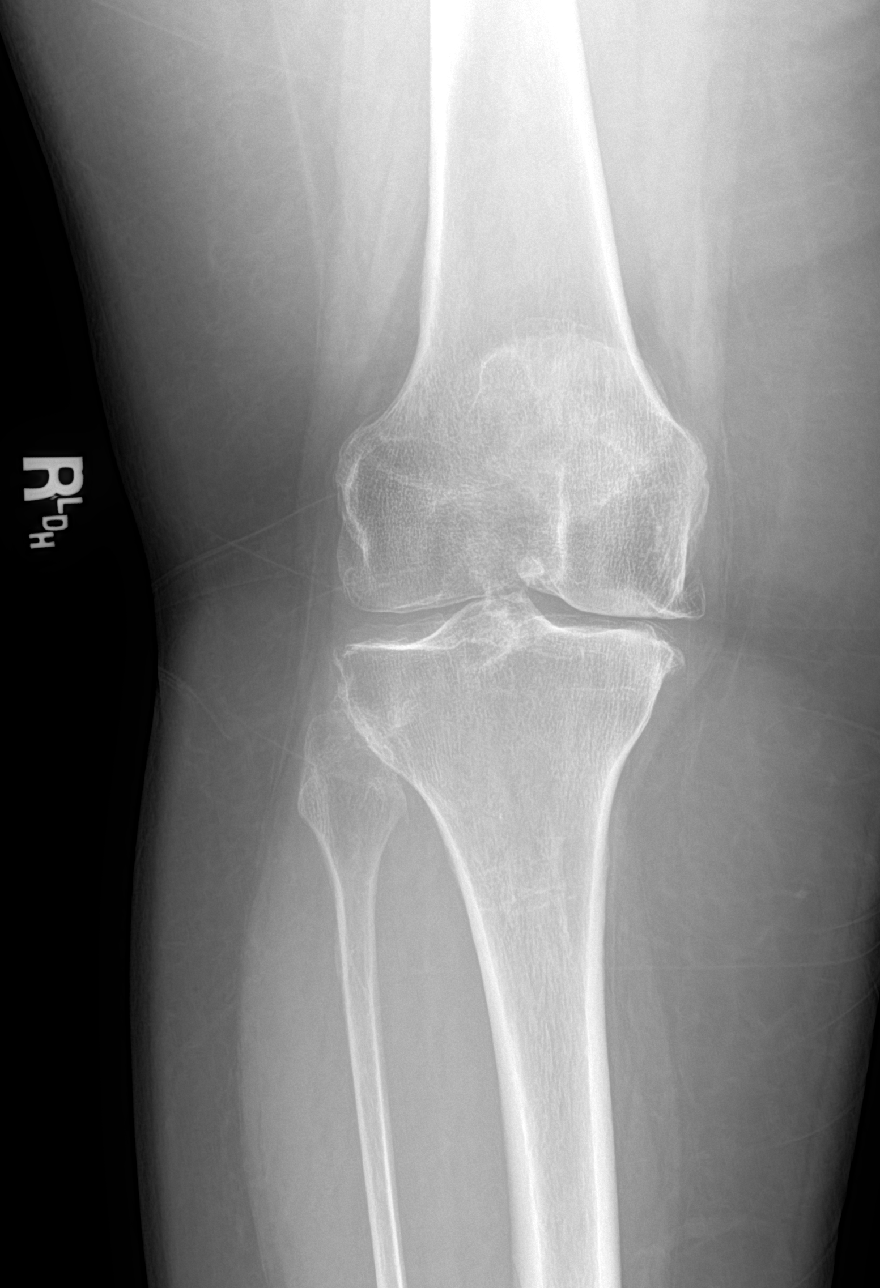
[im 4/4]
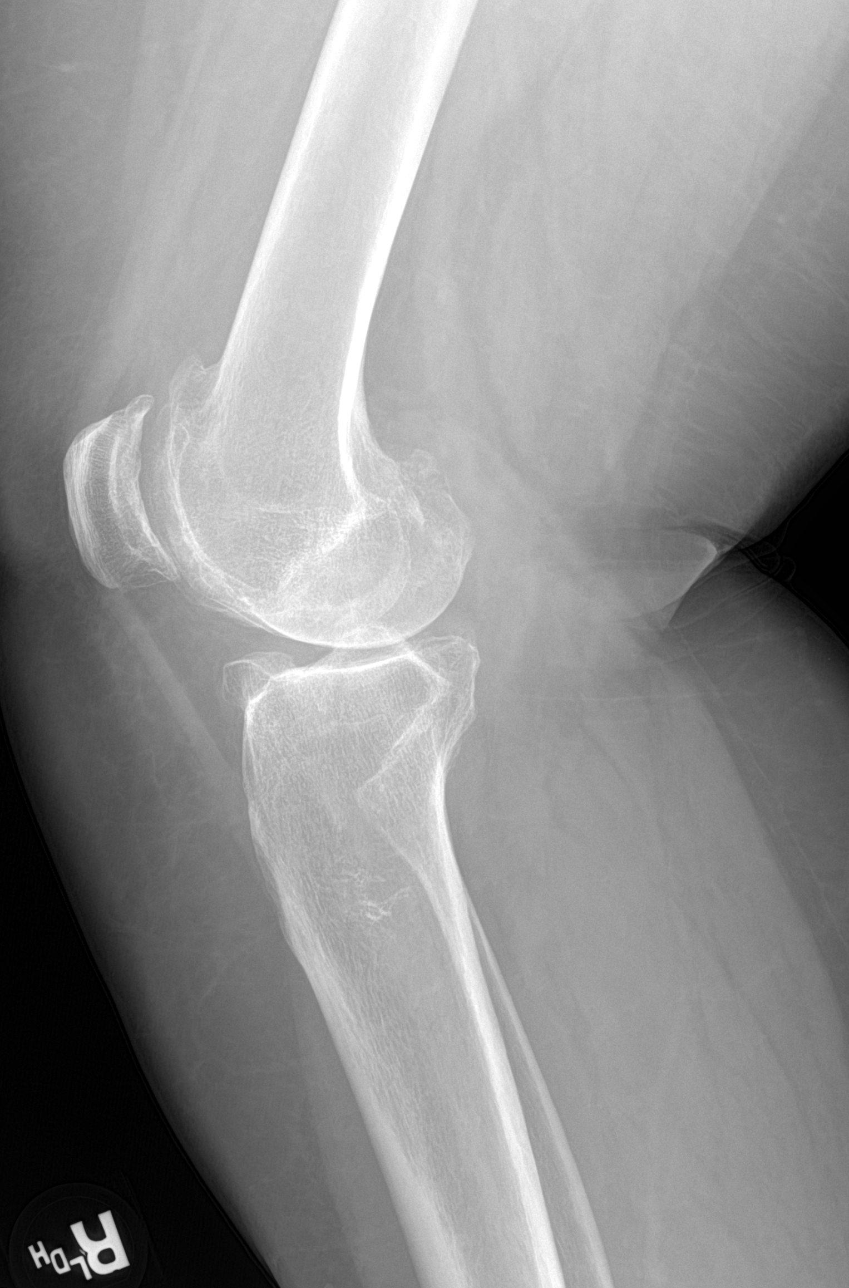

[4 of 4 positions shown; findings below may reference images not displayed]

FINDINGS: The bones are demineralized. There is no evidence of acute fracture
or dislocation. There are tricompartmental degenerative changes with
osteophytes and joint space loss most advanced in the medial and
patellofemoral compartments. No significant joint effusion
identified.
IMPRESSION: Stable right knee radiographs demonstrating tricompartmental
osteoarthritis and osteopenia. No acute osseous findings.

## 2017-06-13 DIAGNOSIS — M170 Bilateral primary osteoarthritis of knee: Secondary | ICD-10-CM | POA: Insufficient documentation

## 2017-06-13 DIAGNOSIS — M5441 Lumbago with sciatica, right side: Secondary | ICD-10-CM

## 2017-06-13 HISTORY — DX: Lumbago with sciatica, right side: M54.41

## 2017-06-13 HISTORY — DX: Bilateral primary osteoarthritis of knee: M17.0

## 2017-06-18 ENCOUNTER — Emergency Department
Admission: EM | Admit: 2017-06-18 | Discharge: 2017-06-18 | Disposition: A | Discharge status: Home / Self Care | Payer: Medicaid Other | Source: Home / Self Care

## 2017-06-18 ENCOUNTER — Emergency Department: Payer: Medicaid Other

## 2017-06-18 DIAGNOSIS — R079 Chest pain, unspecified: Secondary | ICD-10-CM | POA: Insufficient documentation

## 2017-06-18 LAB — CBC
HCT: 42.7 % (ref 35.0–47.0)
Hemoglobin: 14.3 g/dL (ref 12.0–16.0)
MCH: 29.9 pg (ref 26.0–34.0)
MCHC: 33.5 g/dL (ref 32.0–36.0)
MCV: 89.5 fL (ref 80.0–100.0)
Platelets: 294 10*3/uL (ref 150–440)
RBC: 4.77 MIL/uL (ref 3.80–5.20)
RDW: 14.9 % — ABNORMAL HIGH (ref 11.5–14.5)
WBC: 13.5 10*3/uL — ABNORMAL HIGH (ref 3.6–11.0)

## 2017-06-18 LAB — COMPREHENSIVE METABOLIC PANEL
ALT: 38 U/L (ref 14–54)
AST: 23 U/L (ref 15–41)
Albumin: 3.9 g/dL (ref 3.5–5.0)
Alkaline Phosphatase: 135 U/L — ABNORMAL HIGH (ref 38–126)
Anion gap: 12 (ref 5–15)
BUN: 16 mg/dL (ref 6–20)
CO2: 26 mmol/L (ref 22–32)
Calcium: 9.6 mg/dL (ref 8.9–10.3)
Chloride: 98 mmol/L — ABNORMAL LOW (ref 101–111)
Creatinine, Ser: 1.36 mg/dL — ABNORMAL HIGH (ref 0.44–1.00)
GFR calc Af Amer: 49 mL/min — ABNORMAL LOW (ref >60)
GFR calc non Af Amer: 42 mL/min — ABNORMAL LOW (ref >60)
Glucose, Bld: 167 mg/dL — ABNORMAL HIGH (ref 65–99)
Potassium: 3.4 mmol/L — ABNORMAL LOW (ref 3.5–5.1)
Sodium: 136 mmol/L (ref 135–145)
Total Bilirubin: 0.6 mg/dL (ref 0.3–1.2)
Total Protein: 8.4 g/dL — ABNORMAL HIGH (ref 6.5–8.1)

## 2017-06-18 LAB — TROPONIN I: Troponin I: <0.03 ng/mL (ref <0.03)

## 2017-06-18 NOTE — ED Triage Notes (Signed)
Pt in with co left sternal chest pain x 2 days no cardiac history. States has had some shob and nausea with pain.

## 2017-06-19 ENCOUNTER — Inpatient Hospital Stay
Admission: EM | Admit: 2017-06-19 | Discharge: 2017-06-28 | DRG: 357 | Disposition: A | Discharge status: Home / Self Care | Payer: Medicaid Other | Attending: Surgery | Admitting: Surgery

## 2017-06-19 ENCOUNTER — Other Ambulatory Visit: Payer: Self-pay

## 2017-06-19 ENCOUNTER — Emergency Department: Payer: Medicaid Other

## 2017-06-19 ENCOUNTER — Telehealth: Payer: Self-pay | Admitting: Emergency Medicine

## 2017-06-19 ENCOUNTER — Encounter: Payer: Self-pay | Admitting: Emergency Medicine

## 2017-06-19 DIAGNOSIS — I160 Hypertensive urgency: Secondary | ICD-10-CM | POA: Diagnosis present

## 2017-06-19 DIAGNOSIS — K55059 Acute (reversible) ischemia of intestine, part and extent unspecified: Secondary | ICD-10-CM

## 2017-06-19 DIAGNOSIS — K55011 Focal (segmental) acute (reversible) ischemia of small intestine: Principal | ICD-10-CM

## 2017-06-19 DIAGNOSIS — K55069 Acute infarction of intestine, part and extent unspecified: Secondary | ICD-10-CM | POA: Diagnosis present

## 2017-06-19 DIAGNOSIS — Z90710 Acquired absence of both cervix and uterus: Secondary | ICD-10-CM

## 2017-06-19 DIAGNOSIS — E785 Hyperlipidemia, unspecified: Secondary | ICD-10-CM | POA: Diagnosis present

## 2017-06-19 DIAGNOSIS — R109 Unspecified abdominal pain: Secondary | ICD-10-CM

## 2017-06-19 DIAGNOSIS — K219 Gastro-esophageal reflux disease without esophagitis: Secondary | ICD-10-CM | POA: Diagnosis present

## 2017-06-19 DIAGNOSIS — Z79899 Other long term (current) drug therapy: Secondary | ICD-10-CM

## 2017-06-19 DIAGNOSIS — I10 Essential (primary) hypertension: Secondary | ICD-10-CM | POA: Diagnosis present

## 2017-06-19 DIAGNOSIS — Z7982 Long term (current) use of aspirin: Secondary | ICD-10-CM | POA: Diagnosis not present

## 2017-06-19 DIAGNOSIS — Z9049 Acquired absence of other specified parts of digestive tract: Secondary | ICD-10-CM

## 2017-06-19 DIAGNOSIS — Z7952 Long term (current) use of systemic steroids: Secondary | ICD-10-CM

## 2017-06-19 DIAGNOSIS — R002 Palpitations: Secondary | ICD-10-CM | POA: Diagnosis present

## 2017-06-19 DIAGNOSIS — E876 Hypokalemia: Secondary | ICD-10-CM | POA: Diagnosis present

## 2017-06-19 DIAGNOSIS — F17210 Nicotine dependence, cigarettes, uncomplicated: Secondary | ICD-10-CM | POA: Diagnosis present

## 2017-06-19 DIAGNOSIS — K55029 Acute infarction of small intestine, extent unspecified: Secondary | ICD-10-CM | POA: Diagnosis present

## 2017-06-19 DIAGNOSIS — K5900 Constipation, unspecified: Secondary | ICD-10-CM | POA: Diagnosis not present

## 2017-06-19 DIAGNOSIS — I7409 Other arterial embolism and thrombosis of abdominal aorta: Secondary | ICD-10-CM | POA: Diagnosis present

## 2017-06-19 DIAGNOSIS — R1084 Generalized abdominal pain: Secondary | ICD-10-CM | POA: Diagnosis present

## 2017-06-19 DIAGNOSIS — Z6841 Body Mass Index (BMI) 40.0 and over, adult: Secondary | ICD-10-CM | POA: Diagnosis not present

## 2017-06-19 HISTORY — DX: Acute (reversible) ischemia of intestine, part and extent unspecified: K55.059

## 2017-06-19 HISTORY — DX: Chest pain, unspecified: R07.9

## 2017-06-19 HISTORY — DX: Atherosclerosis of aorta: I70.0

## 2017-06-19 HISTORY — DX: Other ill-defined heart diseases: I51.89

## 2017-06-19 HISTORY — DX: Synovial cyst of popliteal space (Baker), right knee: M71.21

## 2017-06-19 LAB — BASIC METABOLIC PANEL
Anion gap: 13 (ref 5–15)
BUN: 15 mg/dL (ref 6–20)
CO2: 24 mmol/L (ref 22–32)
Calcium: 9.6 mg/dL (ref 8.9–10.3)
Chloride: 97 mmol/L — ABNORMAL LOW (ref 101–111)
Creatinine, Ser: 0.74 mg/dL (ref 0.44–1.00)
GFR calc Af Amer: >60 mL/min (ref >60)
GFR calc non Af Amer: >60 mL/min (ref >60)
Glucose, Bld: 140 mg/dL — ABNORMAL HIGH (ref 65–99)
Potassium: 3.3 mmol/L — ABNORMAL LOW (ref 3.5–5.1)
Sodium: 134 mmol/L — ABNORMAL LOW (ref 135–145)

## 2017-06-19 LAB — CBC
HCT: 43.5 % (ref 35.0–47.0)
Hemoglobin: 14.4 g/dL (ref 12.0–16.0)
MCH: 29.6 pg (ref 26.0–34.0)
MCHC: 33.2 g/dL (ref 32.0–36.0)
MCV: 89 fL (ref 80.0–100.0)
Platelets: 317 10*3/uL (ref 150–440)
RBC: 4.88 MIL/uL (ref 3.80–5.20)
RDW: 14.5 % (ref 11.5–14.5)
WBC: 17 10*3/uL — ABNORMAL HIGH (ref 3.6–11.0)

## 2017-06-19 LAB — URINALYSIS, COMPLETE (UACMP) WITH MICROSCOPIC
Bilirubin Urine: NEGATIVE
Glucose, UA: NEGATIVE mg/dL
Ketones, ur: 5 mg/dL — AB
Leukocytes, UA: NEGATIVE
Nitrite: NEGATIVE
Protein, ur: 30 mg/dL — AB
Specific Gravity, Urine: 1.029 (ref 1.005–1.030)
pH: 5 (ref 5.0–8.0)

## 2017-06-19 LAB — MRSA PCR SCREENING: MRSA by PCR: NEGATIVE

## 2017-06-19 LAB — GLUCOSE, CAPILLARY: Glucose-Capillary: 122 mg/dL — ABNORMAL HIGH (ref 65–99)

## 2017-06-19 LAB — MAGNESIUM: Magnesium: 1.7 mg/dL (ref 1.7–2.4)

## 2017-06-19 LAB — LACTIC ACID, PLASMA
Lactic Acid, Venous: 1.3 mmol/L (ref 0.5–1.9)
Lactic Acid, Venous: 1.6 mmol/L (ref 0.5–1.9)

## 2017-06-19 LAB — LIPASE, BLOOD: Lipase: 24 U/L (ref 11–51)

## 2017-06-19 LAB — TROPONIN I: Troponin I: <0.03 ng/mL (ref <0.03)

## 2017-06-19 LAB — PROTIME-INR
INR: 1.15
Prothrombin Time: 14.6 seconds (ref 11.4–15.2)

## 2017-06-19 LAB — APTT: aPTT: 103 seconds — ABNORMAL HIGH (ref 24–36)

## 2017-06-19 MED ORDER — PIPERACILLIN-TAZOBACTAM 3.375 G IVPB 30 MIN
3.3750 g | Freq: Once | INTRAVENOUS | Status: AC
  Administered 2017-06-19: 3.375 g via INTRAVENOUS

## 2017-06-19 MED ORDER — ONDANSETRON 4 MG PO TBDP
ORAL_TABLET | ORAL | Status: AC
  Filled 2017-06-19: qty 1

## 2017-06-19 MED ORDER — ONDANSETRON HCL 4 MG/2ML IJ SOLN
4.0000 mg | Freq: Once | INTRAMUSCULAR | Status: AC
  Administered 2017-06-19: 4 mg via INTRAVENOUS
  Filled 2017-06-19: qty 2

## 2017-06-19 MED ORDER — ONDANSETRON 4 MG PO TBDP
4.0000 mg | ORAL_TABLET | Freq: Once | ORAL | Status: AC | PRN
  Administered 2017-06-19: 4 mg via ORAL

## 2017-06-19 MED ORDER — IOPAMIDOL (ISOVUE-370) INJECTION 76%
125.0000 mL | Freq: Once | INTRAVENOUS | Status: AC | PRN
  Administered 2017-06-19: 125 mL via INTRAVENOUS
  Filled 2017-06-19: qty 125

## 2017-06-19 MED ORDER — LACTATED RINGERS IV BOLUS (SEPSIS)
1000.0000 mL | Freq: Once | INTRAVENOUS | Status: AC
  Administered 2017-06-19: 1000 mL via INTRAVENOUS

## 2017-06-19 MED ORDER — ONDANSETRON HCL 4 MG/2ML IJ SOLN: 4.0000 mg | Freq: Four times a day (QID) | INTRAMUSCULAR | Status: DC | PRN

## 2017-06-19 MED ORDER — KETOROLAC TROMETHAMINE 30 MG/ML IJ SOLN
15.0000 mg | INTRAMUSCULAR | Status: AC
  Administered 2017-06-19: 15 mg via INTRAVENOUS
  Filled 2017-06-19: qty 1

## 2017-06-19 MED ORDER — SODIUM CHLORIDE 0.9 % IV BOLUS (SEPSIS)
1000.0000 mL | Freq: Once | INTRAVENOUS | Status: AC
  Administered 2017-06-19: 1000 mL via INTRAVENOUS

## 2017-06-19 MED ORDER — PANTOPRAZOLE SODIUM 40 MG IV SOLR
40.0000 mg | INTRAVENOUS | Status: DC
  Administered 2017-06-19 – 2017-06-24 (×6): 40 mg via INTRAVENOUS
  Filled 2017-06-19 (×6): qty 40

## 2017-06-19 MED ORDER — HYDROMORPHONE HCL 1 MG/ML IJ SOLN
INTRAMUSCULAR | Status: AC
  Filled 2017-06-19: qty 1

## 2017-06-19 MED ORDER — PIPERACILLIN-TAZOBACTAM 3.375 G IVPB: 3.3750 g | Freq: Four times a day (QID) | INTRAVENOUS | Status: DC

## 2017-06-19 MED ORDER — HEPARIN (PORCINE) IN NACL 100-0.45 UNIT/ML-% IJ SOLN
1500.0000 [IU]/h | INTRAMUSCULAR | Status: DC
  Administered 2017-06-19: 1200 [IU]/h via INTRAVENOUS
  Administered 2017-06-20: 1350 [IU]/h via INTRAVENOUS
  Administered 2017-06-22 – 2017-06-27 (×8): 1500 [IU]/h via INTRAVENOUS
  Filled 2017-06-19 (×11): qty 250

## 2017-06-19 MED ORDER — HYDROMORPHONE HCL 1 MG/ML IJ SOLN
1.0000 mg | Freq: Once | INTRAMUSCULAR | Status: AC
  Administered 2017-06-19: 1 mg via INTRAVENOUS

## 2017-06-19 MED ORDER — LABETALOL HCL 5 MG/ML IV SOLN
10.0000 mg | INTRAVENOUS | Status: DC | PRN
  Administered 2017-06-19: 10 mg via INTRAVENOUS
  Filled 2017-06-19: qty 4

## 2017-06-19 MED ORDER — ACETAMINOPHEN 10 MG/ML IV SOLN
1000.0000 mg | Freq: Once | INTRAVENOUS | Status: AC
  Administered 2017-06-19: 1000 mg via INTRAVENOUS
  Filled 2017-06-19: qty 100

## 2017-06-19 MED ORDER — HEPARIN BOLUS VIA INFUSION
3750.0000 [IU] | Freq: Once | INTRAVENOUS | Status: AC
  Administered 2017-06-19: 3750 [IU] via INTRAVENOUS
  Filled 2017-06-19: qty 3750

## 2017-06-19 MED ORDER — POTASSIUM CHLORIDE 10 MEQ/100ML IV SOLN
10.0000 meq | INTRAVENOUS | Status: AC
  Administered 2017-06-19 – 2017-06-20 (×2): 10 meq via INTRAVENOUS
  Filled 2017-06-19 (×4): qty 100

## 2017-06-19 MED ORDER — NAPROXEN 500 MG PO TABS
500.0000 mg | ORAL_TABLET | Freq: Once | ORAL | Status: AC
  Administered 2017-06-19: 500 mg via ORAL

## 2017-06-19 MED ORDER — PIPERACILLIN-TAZOBACTAM 3.375 G IVPB 30 MIN
INTRAVENOUS | Status: AC
  Filled 2017-06-19: qty 50

## 2017-06-19 MED ORDER — HYDROMORPHONE HCL 1 MG/ML IJ SOLN
0.5000 mg | INTRAMUSCULAR | Status: DC | PRN
  Administered 2017-06-19 – 2017-06-20 (×5): 0.5 mg via INTRAVENOUS
  Filled 2017-06-19 (×5): qty 1

## 2017-06-19 MED ORDER — NAPROXEN 500 MG PO TABS
ORAL_TABLET | ORAL | Status: AC
  Filled 2017-06-19: qty 1

## 2017-06-19 MED ORDER — ONDANSETRON 4 MG PO TBDP
4.0000 mg | ORAL_TABLET | Freq: Four times a day (QID) | ORAL | Status: DC | PRN
Start: 2017-06-19 — End: 2017-06-28
  Filled 2017-06-19: qty 1

## 2017-06-19 MED ORDER — DEXTROSE IN LACTATED RINGERS 5 % IV SOLN
INTRAVENOUS | Status: DC
  Administered 2017-06-19 – 2017-06-20 (×2): via INTRAVENOUS
  Administered 2017-06-21: 125 mL/h via INTRAVENOUS
  Administered 2017-06-21 – 2017-06-25 (×10): via INTRAVENOUS
  Filled 2017-06-19 (×4): qty 1000

## 2017-06-19 MED ORDER — PIPERACILLIN-TAZOBACTAM 3.375 G IVPB: 3.3750 g | Freq: Three times a day (TID) | INTRAVENOUS | Status: DC

## 2017-06-19 MED ORDER — HYDROMORPHONE HCL 1 MG/ML IJ SOLN
1.0000 mg | Freq: Once | INTRAMUSCULAR | Status: AC
  Administered 2017-06-19: 1 mg via INTRAVENOUS
  Filled 2017-06-19: qty 1

## 2017-06-19 NOTE — ED Notes (Signed)
External female catheter in place.

## 2017-06-19 NOTE — ED Notes (Signed)
Erasmo Downer RN gave pt her cell phone and informed her daughter would like her to call her

## 2017-06-19 NOTE — H&P (Addendum)
SURGICAL HISTORY & PHYSICAL (cpt [phone])  HISTORY OF PRESENT ILLNESS (HPI):  56 y.o. female presented to [facility] ED today with worsening upper abdominal pain. She reports that the pain first started 6 1/2 days ago. She attributed the pain to constipation and took 4 Ex-Lax, followed by 3 stool softeners, after which she passed small non-bloody BM's, but also after which the pain worsened and yesterday became intolerable. She continues to pass flatus. Over the past 3 weeks, patient reports she's been experiencing chest palpitations with intermittently "very fast" heart rate. She denies prior similar history, but reportedly developed acute onset of Bell's palsy 3 years ago without any known history of atrial fibrillation or stroke. Along with her pain, she reports nausea without emesis and states she at baseline get SOB easily with even short distance ambulation and needs to stop ~twice for SOB when walking up a flight of stairs. She otherwise reports +nausea without emesis and +subjective chills, denies CP. She saw her PMD a week ago, but denies mentioning any of the above.  PAST MEDICAL HISTORY (PMH):  Past Medical History:  Diagnosis Date  . Bell's palsy   . GERD (gastroesophageal reflux disease)   . Hyperlipidemia   . Hypertension   . Morbid obesity with BMI of 40.0-44.9, adult ([place])     Reviewed. Otherwise negative.   PAST SURGICAL HISTORY ([place]):  Past Surgical History:  Procedure Laterality Date  . CHOLECYSTECTOMY    . VAGINAL HYSTERECTOMY      Reviewed. Otherwise negative.   MEDICATIONS:  Prior to Admission medications   Medication Sig Start Date End Date Taking? Authorizing Provider  aspirin EC 81 MG tablet Take 81 mg by mouth daily.   Yes [provider]  atorvastatin (LIPITOR) 80 MG tablet Take 80 mg by mouth at bedtime.   Yes [provider]  gabapentin (NEURONTIN) 300 MG capsule Take 300 mg by mouth 2 (two) times daily.   Yes [provider]   lisinopril (PRINIVIL,ZESTRIL) 40 MG tablet Take 40 mg by mouth daily.   Yes [provider]  metoprolol tartrate (LOPRESSOR) 25 MG tablet Take 25 mg by mouth 2 (two) times daily.   Yes [provider]  omeprazole (PRILOSEC) 20 MG capsule Take 20 mg by mouth 2 (two) times daily.   Yes [provider]  traMADol (ULTRAM) 50 MG tablet Take 50 mg by mouth every 6 (six) hours as needed for moderate pain.    Yes [provider]     ALLERGIES:  No Known Allergies   SOCIAL HISTORY:  Social History   Social History  . Marital status: Single    Spouse name: N/A  . Number of children: N/A  . Years of education: N/A   Occupational History  . Not on file.   Social History Main Topics  . Smoking status: Current Some Day Smoker    Packs/day: 0.50    Types: Cigarettes  . Smokeless tobacco: Never Used  . Alcohol use No  . Drug use: No  . Sexual activity: Not on file   Other Topics Concern  . Not on file   Social History Narrative  . No narrative on file    The patient currently resides (home / rehab facility / nursing home): Home  The patient normally is (ambulatory / bedbound) : Ambulatory   FAMILY HISTORY:  Family History  Problem Relation Age of Onset  . Hypertension Mother   . Heart disease Mother   . Heart attack Mother   .  Breast cancer Mother   . Hypertension Father   . Breast cancer Paternal Aunt     Otherwise negative.   REVIEW OF SYSTEMS:  Constitutional: denies any other weight loss, fever, chills, or sweats  Eyes: denies any other vision changes, history of eye injury  ENT: denies sore throat, hearing problems  Respiratory: denies shortness of breath, wheezing  Cardiovascular: denies chest pain, palpitations  Gastrointestinal: abdominal pain, N/V, and bowel function as per HPI  Genitourinary: denies burning with urination or urinary frequency Musculoskeletal: denies any other joint pains or cramps  Skin: Denies any other  rashes or skin discolorations  Neurological: denies any other headache, dizziness, weakness  Psychiatric: denies any other depression, anxiety   All other review of systems were otherwise negative.  VITAL SIGNS:  Temp:  [98 F (36.7 C)] 98 F (36.7 C) (08/28 1318) Pulse Rate:  [66-83] 75 (08/28 1930) Resp:  [17-20] 18 (08/28 1930) BP: (136-192)/(91-106) 192/97 (08/28 1930) SpO2:  [94 %-100 %] 100 % (08/28 1930) Weight:  [227 lb (103 kg)] 227 lb (103 kg) (08/28 1307)     Height: 5\' 2"  (157.5 cm) Weight: 227 lb (103 kg) BMI (Calculated): 41.51   INTAKE/OUTPUT:  This shift: No intake/output data recorded.  Last 2 shifts: @IOLAST2SHIFTS @  PHYSICAL EXAM:  Constitutional:  -- Obese body habitus  -- Awake, alert, and oriented x3  Eyes:  -- Pupils equally round and reactive to light  -- No scleral icterus  Ear, nose, throat:  -- No jugular venous distension  Pulmonary:  -- No crackles  -- Equally decreased breath sounds bilaterally, attributable to pain and body habitus -- Breathing non-labored at rest Cardiovascular:  -- S1, S2 present  -- No pericardial rubs  Gastrointestinal:  -- Abdomen soft and non-distended with severe upper abdominal tenderness to palpation, no guarding or rebound tenderness -- No abdominal masses appreciated, pulsatile or otherwise  Musculoskeletal and Integumentary:  -- Wounds or skin discoloration: None appreciated -- Extremities: B/L UE and LE FROM, hands and feet warm  Neurologic:  -- Motor function: Intact and symmetric -- Sensation: Intact and symmetric  Pulse/Doppler Exam: (p=palpable; d=doppler signals; 0=none)    Right   Left   Brach  p   p   Rad  p   p   Fem  p   p   DP  p   p   Labs:  CBC Latest Ref Rng & Units 06/19/2017 06/18/2017 07/23/2016  WBC 3.6 - 11.0 K/uL 17.0(H) 13.5(H) 7.8  Hemoglobin 12.0 - 16.0 g/dL 14.4 14.3 12.7  Hematocrit 35.0 - 47.0 % 43.5 42.7 38.9  Platelets 150 - 440 K/uL 317 294 247   CMP Latest Ref Rng &  Units 06/19/2017 06/18/2017 07/23/2016  Glucose 65 - 99 mg/dL 140(H) 167(H) 109(H)  BUN 6 - 20 mg/dL 15 16 16   Creatinine 0.44 - 1.00 mg/dL 0.74 1.36(H) 0.80  Sodium 135 - 145 mmol/L 134(L) 136 138  Potassium 3.5 - 5.1 mmol/L 3.3(L) 3.4(L) 4.1  Chloride 101 - 111 mmol/L 97(L) 98(L) 106  CO2 22 - 32 mmol/L 24 26 24   Calcium 8.9 - 10.3 mg/dL 9.6 9.6 8.7(L)  Total Protein 6.5 - 8.1 g/dL - 8.4(H) 7.0  Total Bilirubin 0.3 - 1.2 mg/dL - 0.6 0.2(L)  Alkaline Phos 38 - 126 U/L - 135(H) 93  AST 15 - 41 U/L - 23 13(L)  ALT 14 - 54 U/L - 38 13(L)   Lactate: 1.3  Imaging studies:  CTA Abdomen and Pelvis (  06/19/2017) - personally reviewed and discussed with patient 1. 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 2. A short segment of small bowel shows wall thickening and adjacent inflammation. This lies in the pelvis. Given the findings in the superior mesenteric artery, focal small bowel ischemia is suspected. This could, alternatively, be inflammatory or infectious in etiology. No pneumatosis intestinalis or portal venous air. 3. No other significant vascular stenosis. 4. 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 No aneurysm or dissection.  CTA Chest (06/19/2017) 1. No evidence of an aortic dissection. 2. No acute findings. 3. Mild coronary artery calcifications.  EKG (06/19/2017) Normal sinus rhythm  Assessment/Plan: (ICD-10's: K55.011) 56 y.o. 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    - NPO, IVF  - admit to step-down  - therapeutic heparin bolus and infusion initiated,  IV antibiotics (Zosyn)  - surgical intervention with likely small bowel resection and SMA embolectomy vs endovascular intervention discussed with patient, vascular surgery (Dr. Lucky Cowboy), and [facility] OR, but considering symptoms x 1 week and normal lactate, will closely monitor serial abdominal exams following heparinization and reassess  - all risks, benefits, and alternatives to laparotomy with possible small bowel resection and possible arterial embolectomy vs angiogram +/- endovascular intervention were discussed with the patient and her family, all of their questions were answered to their expressed satisfaction, patient expresses she wishes to proceed if condition warrants, and informed consent was obtained.  - medical consultation also requested for HTN, palpitations, SOB, medical management, and optimization  All of the above findings and recommendations were discussed with the patient and her family, and all of her and family's questions were answered to their expressed satisfaction.  -- Carla [name] Rosana Hoes, MD, [place]: [facility] - Partnering for exceptional care. Office: [phone]

## 2017-06-19 NOTE — Consult Note (Signed)
ANTICOAGULATION CONSULT NOTE - Initial Consult  Pharmacy Consult for heparin drip Indication: mesentaric ischemia  No Known Allergies  Patient Measurements: Height: 5\' 2"  (157.5 cm) Weight: 227 lb (103 kg) IBW/kg (Calculated) : 50.1 Heparin Dosing Weight: 74.7kg  Vital Signs: Temp: 98 F (36.7 C) (08/28 1318) Temp Source: Oral (08/28 1318) BP: 187/94 (08/28 1921) Pulse Rate: 80 (08/28 1921)  Labs:  Recent Labs  06/18/17 1954 06/19/17 1314  HGB 14.3 14.4  HCT 42.7 43.5  PLT 294 317  CREATININE 1.36* 0.74  TROPONINI <0.03 <0.03    Estimated Creatinine Clearance: 87.3 mL/min (by C-G formula based on SCr of 0.74 mg/dL).   Medical History: Past Medical History:  Diagnosis Date  . Bell's palsy   . GERD (gastroesophageal reflux disease)   . Hyperlipidemia   . Hypertension   . Morbid obesity with BMI of 40.0-44.9, adult ([facility])     Medications:  Scheduled:  . heparin  3,750 Units Intravenous Once  . ondansetron        Assessment: Pt is a 56 year old female who presents with abdominal/chest pain. 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 Pharmacy consulted to dose heparin drip.  Goal of Therapy:  Heparin level 0.3-0.7 units/ml Monitor platelets by anticoagulation protocol: Yes   Plan:  Give 3750 units bolus x 1 Start heparin infusion at 1200 units/hr Check anti-Xa level in 6 hours and daily while on heparin Continue to monitor H&H and platelets  Chasyn Cinque D Jevante Hollibaugh, Pharm.D, BCPS Clinical Pharmacist  06/19/2017,7:22 PM

## 2017-06-19 NOTE — ED Notes (Addendum)
pts daughter brandy called wanting to speak with pt. Informed her that there are no phones in the room and pt daughter states "i know they always bring her one".  Explained to daughter that RN is getting ready to transport pt and will get phone from other side once return. Daughter states "i have been trying to call for 2 hours". Informed her that this RN has not spoken to her and unaware of who she has spoken to and will get her a phone as soon as finish taking of car other pt.  Daughter states "she knows my number this is her daughter brandy".

## 2017-06-19 NOTE — Consult Note (Signed)
Reason for Consult:  Chief Complaint  Patient presents with  . Chest Pain   Referring Physician: dr. Rosana Hoes  Reason for consult elevated blood pressure and palpitations  Carla [name] is an 56 y.o. female. With past medical history of GERD, hypertension, hyperlipidemia and morbid obesity came into the ED with a chief complaint of generalized abdominal pain for one week which has been worse for 3 days associated with nausea. Denies any hematemesis. Patient is admitted to surgery for ischemic bowel and hospitalist team is consulted for elevated blood pressure and palpitations. Patient is reporting severe abdominal pain during my examination and also reporting intermittent episodes of palpitations for several weeks, not seen by any cardiologist in the past    Past Medical History:  Diagnosis Date  . Bell's palsy   . GERD (gastroesophageal reflux disease)   . Hyperlipidemia   . Hypertension   . Morbid obesity with BMI of 40.0-44.9, adult [facility])     Past Surgical History:  Procedure Laterality Date  . CHOLECYSTECTOMY    . VAGINAL HYSTERECTOMY      Family History  Problem Relation Age of Onset  . Hypertension Mother   . Heart disease Mother   . Heart attack Mother   . Breast cancer Mother   . Hypertension Father   . Breast cancer Paternal Aunt     Social History:  reports that she has been smoking Cigarettes.  She has been smoking about 0.50 packs per day. She has never used smokeless tobacco. She reports that she does not drink alcohol or use drugs.  Allergies: No Known Allergies  Medications: I have reviewed the patient's current medications.  Results for orders placed or performed during the hospital encounter of 06/19/17 (from the past 48 hour(s))  Urinalysis, Complete w Microscopic     Status: Abnormal   Collection Time: 06/19/17  1:07 PM  Result Value Ref Range   Color, Urine AMBER (A) YELLOW    Comment: BIOCHEMICALS MAY BE AFFECTED BY COLOR   APPearance CLOUDY (A)  CLEAR   Specific Gravity, Urine 1.029 1.005 - 1.030   pH 5.0 5.0 - 8.0   Glucose, UA NEGATIVE NEGATIVE mg/dL   Hgb urine dipstick MODERATE (A) NEGATIVE   Bilirubin Urine NEGATIVE NEGATIVE   Ketones, ur 5 (A) NEGATIVE mg/dL   Protein, ur 30 (A) NEGATIVE mg/dL   Nitrite NEGATIVE NEGATIVE   Leukocytes, UA NEGATIVE NEGATIVE   RBC / HPF 6-30 0 - 5 RBC/hpf   WBC, UA 0-5 0 - 5 WBC/hpf   Bacteria, UA FEW (A) NONE SEEN   Squamous Epithelial / LPF 6-30 (A) NONE SEEN   Mucus PRESENT   Basic metabolic panel     Status: Abnormal   Collection Time: 06/19/17  1:14 PM  Result Value Ref Range   Sodium 134 (L) 135 - 145 mmol/L   Potassium 3.3 (L) 3.5 - 5.1 mmol/L   Chloride 97 (L) 101 - 111 mmol/L   CO2 24 22 - 32 mmol/L   Glucose, Bld 140 (H) 65 - 99 mg/dL   BUN 15 6 - 20 mg/dL   Creatinine, Ser 0.74 0.44 - 1.00 mg/dL   Calcium 9.6 8.9 - 10.3 mg/dL   GFR calc non Af Amer >60 >60 mL/min   GFR calc Af Amer >60 >60 mL/min    Comment: (NOTE) The eGFR has been calculated using the CKD EPI equation. This calculation has not been validated in all clinical situations. eGFR's persistently <60 mL/min signify possible Chronic  Kidney Disease.    Anion gap 13 5 - 15  CBC     Status: Abnormal   Collection Time: 06/19/17  1:14 PM  Result Value Ref Range   WBC 17.0 (H) 3.6 - 11.0 K/uL   RBC 4.88 3.80 - 5.20 MIL/uL   Hemoglobin 14.4 12.0 - 16.0 g/dL   HCT 43.5 35.0 - 47.0 %   MCV 89.0 80.0 - 100.0 fL   MCH 29.6 26.0 - 34.0 pg   MCHC 33.2 32.0 - 36.0 g/dL   RDW 14.5 11.5 - 14.5 %   Platelets 317 150 - 440 K/uL  Troponin I     Status: None   Collection Time: 06/19/17  1:14 PM  Result Value Ref Range   Troponin I <0.03 <0.03 ng/mL  Lipase, blood     Status: None   Collection Time: 06/19/17  1:14 PM  Result Value Ref Range   Lipase 24 11 - 51 U/L  Lactic acid, plasma     Status: None   Collection Time: 06/19/17  6:09 PM  Result Value Ref Range   Lactic Acid, Venous 1.3 0.5 - 1.9 mmol/L    Dg  Chest 2 View  Result Date: 06/19/2017 CLINICAL DATA:  Left chest pain for 3 days EXAM: CHEST  2 VIEW COMPARISON:  June 18, 2017 FINDINGS: The heart size and mediastinal contours are within normal limits. There is no focal infiltrate, pulmonary edema, or pleural effusion. Degenerative joint changes of the spine are noted. Prior cholecystectomy clips are noted. IMPRESSION: No active cardiopulmonary disease. Electronically Signed   By: Abelardo Diesel M.D.   On: 06/19/2017 13:54   Dg Chest 2 View  Result Date: 06/18/2017 CLINICAL DATA:  Acute shortness of breath. EXAM: CHEST  2 VIEW COMPARISON:  03/24/2017 and prior radiographs FINDINGS: The cardiomediastinal silhouette is unremarkable. There is no evidence of focal airspace disease, pulmonary edema, suspicious pulmonary nodule/mass, pleural effusion, or pneumothorax. No acute bony abnormalities are identified. IMPRESSION: No active cardiopulmonary disease. Electronically Signed   By: Margarette Canada M.D.   On: 06/18/2017 20:38   Ct Angio Chest Aorta W And/or Wo Contrast  Result Date: 06/19/2017 CLINICAL DATA:  Pt in with co left sternal chest pain x 2 days no cardiac history. States has had some shob and nausea with pain. EXAM: CT ANGIOGRAPHY CHEST, ABDOMEN AND PELVIS TECHNIQUE: Multidetector CT imaging through the chest, abdomen and pelvis was performed using the standard protocol during bolus administration of intravenous contrast. Multiplanar reconstructed images and MIPs were obtained and reviewed to evaluate the vascular anatomy. CONTRAST:  125 mL of Isovue 370 intravenous contrast COMPARISON:  Abdomen and pelvis CT, 04/09/2015 FINDINGS: CTA CHEST FINDINGS Cardiovascular: Heart normal in size and configuration. Mild coronary artery calcifications. Thoracic aorta is normal in caliber. There are minor aortic atherosclerotic calcifications along the arch. Great vessels are normal in caliber. No aortic dissection. Aortic arch branch vessels are widely patent.  Mediastinum/Nodes: No neck base, axillary, mediastinal or hilar masses or adenopathy. Trachea is widely patent. Esophagus is unremarkable. Normal thyroid. Lungs/Pleura: Lungs are clear. No pleural effusion or pneumothorax. Musculoskeletal: No fracture or acute finding. No osteoblastic or osteolytic lesions. Review of the MIP images confirms the above findings. CTA ABDOMEN AND PELVIS FINDINGS VASCULAR Aorta: Atherosclerotic disease is noted throughout the abdominal aorta, mostly the infrarenal portion. There is eccentric plaque with a component of thrombus along the right aspect of the infrarenal aorta extending to the bifurcation. No significant narrowing of the abdominal aorta. No  aneurysm. No dissection. Celiac: Patent without evidence of aneurysm, dissection, vasculitis or significant stenosis. SMA: There is low attenuation material extending for 2.2 cm from the origin of the superior mesenteric artery distally. This causes significant narrowing of the vessel, 70-90%. This is likely thrombus. Distal to this, the vessel is widely patent as are its branch vessels. Renals: Single right and 2 left renal arteries are widely patent. IMA: Some plaque is evident at the origin of the inferior mesenteric artery with at least mild narrowing. The vessel is patent. Inflow: Mild atherosclerotic disease noted along the common iliac arteries without significant stenosis. External iliac vessels and common femoral arteries are widely patent. Veins: No obvious venous abnormality within the limitations of this arterial phase study. Review of the MIP images confirms the above findings. NON-VASCULAR Hepatobiliary: Liver is unremarkable. Gallbladder surgically absent. Common bile duct dilated 9 mm with distal tapering. Pancreas: Unremarkable. No pancreatic ductal dilatation or surrounding inflammatory changes. Spleen: Normal in size without focal abnormality. Adrenals/Urinary Tract: There is nodular thickening throughout the left  adrenal gland most likely nodular hyperplasia. Multiple small adenomas are possible. Right adrenal gland is unremarkable. Kidneys are normal in size, orientation and position. There is symmetric enhancement. Mild bilateral renal cortical thinning. No renal masses or stones. No hydronephrosis. Normal ureters. Bladder is unremarkable. Stomach/Bowel: There is a single loop of small bowel in the central to upper pelvis with wall thickening and adjacent hazy inflammation. There is no pneumatosis intestinalis. Remainder of the small bowel is normal in caliber with normal wall thickness and no other associated mesenteric inflammation. Stomach and colon are unremarkable. Appendix not visualized. Lymphatic: No adenopathy. Reproductive: Status post hysterectomy. No adnexal masses. Other: No abdominal wall hernia.  No ascites. Musculoskeletal: No fracture or acute finding. No osteoblastic or osteolytic lesions. Review of the MIP images confirms the above findings. IMPRESSION: CTA CHEST 1. No evidence of an aortic dissection. 2. No acute findings. 3. Mild coronary artery calcifications. CTA ABDOMEN AND PELVIS 1. 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 2. A short segment of small bowel shows wall thickening and adjacent inflammation. This lies in the pelvis. Given the findings in the superior mesenteric artery, focal small bowel ischemia is suspected. This could, alternatively, be inflammatory or infectious in etiology. No pneumatosis intestinalis or portal venous air. 3. No other significant vascular stenosis. 4. 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 No aneurysm or dissection. Critical Value/emergent results were called by telephone at the time of interpretation on 06/19/2017 at 6:03 pm to Dr. Carrie Mew , who verbally acknowledged these results. Electronically Signed    By: Lajean Manes M.D.   On: 06/19/2017 18:03   Ct Angio Abd/pel W And/or Wo Contrast  Result Date: 06/19/2017 CLINICAL DATA:  Pt in with co left sternal chest pain x 2 days no cardiac history. States has had some shob and nausea with pain. EXAM: CT ANGIOGRAPHY CHEST, ABDOMEN AND PELVIS TECHNIQUE: Multidetector CT imaging through the chest, abdomen and pelvis was performed using the standard protocol during bolus administration of intravenous contrast. Multiplanar reconstructed images and MIPs were obtained and reviewed to evaluate the vascular anatomy. CONTRAST:  125 mL of Isovue 370 intravenous contrast COMPARISON:  Abdomen and pelvis CT, 04/09/2015 FINDINGS: CTA CHEST FINDINGS Cardiovascular: Heart normal in size and configuration. Mild coronary artery calcifications. Thoracic aorta is normal in caliber. There are minor aortic atherosclerotic calcifications along  the arch. Great vessels are normal in caliber. No aortic dissection. Aortic arch branch vessels are widely patent. Mediastinum/Nodes: No neck base, axillary, mediastinal or hilar masses or adenopathy. Trachea is widely patent. Esophagus is unremarkable. Normal thyroid. Lungs/Pleura: Lungs are clear. No pleural effusion or pneumothorax. Musculoskeletal: No fracture or acute finding. No osteoblastic or osteolytic lesions. Review of the MIP images confirms the above findings. CTA ABDOMEN AND PELVIS FINDINGS VASCULAR Aorta: Atherosclerotic disease is noted throughout the abdominal aorta, mostly the infrarenal portion. There is eccentric plaque with a component of thrombus along the right aspect of the infrarenal aorta extending to the bifurcation. No significant narrowing of the abdominal aorta. No aneurysm. No dissection. Celiac: Patent without evidence of aneurysm, dissection, vasculitis or significant stenosis. SMA: There is low attenuation material extending for 2.2 cm from the origin of the superior mesenteric artery distally. This causes  significant narrowing of the vessel, 70-90%. This is likely thrombus. Distal to this, the vessel is widely patent as are its branch vessels. Renals: Single right and 2 left renal arteries are widely patent. IMA: Some plaque is evident at the origin of the inferior mesenteric artery with at least mild narrowing. The vessel is patent. Inflow: Mild atherosclerotic disease noted along the common iliac arteries without significant stenosis. External iliac vessels and common femoral arteries are widely patent. Veins: No obvious venous abnormality within the limitations of this arterial phase study. Review of the MIP images confirms the above findings. NON-VASCULAR Hepatobiliary: Liver is unremarkable. Gallbladder surgically absent. Common bile duct dilated 9 mm with distal tapering. Pancreas: Unremarkable. No pancreatic ductal dilatation or surrounding inflammatory changes. Spleen: Normal in size without focal abnormality. Adrenals/Urinary Tract: There is nodular thickening throughout the left adrenal gland most likely nodular hyperplasia. Multiple small adenomas are possible. Right adrenal gland is unremarkable. Kidneys are normal in size, orientation and position. There is symmetric enhancement. Mild bilateral renal cortical thinning. No renal masses or stones. No hydronephrosis. Normal ureters. Bladder is unremarkable. Stomach/Bowel: There is a single loop of small bowel in the central to upper pelvis with wall thickening and adjacent hazy inflammation. There is no pneumatosis intestinalis. Remainder of the small bowel is normal in caliber with normal wall thickness and no other associated mesenteric inflammation. Stomach and colon are unremarkable. Appendix not visualized. Lymphatic: No adenopathy. Reproductive: Status post hysterectomy. No adnexal masses. Other: No abdominal wall hernia.  No ascites. Musculoskeletal: No fracture or acute finding. No osteoblastic or osteolytic lesions. Review of the MIP images  confirms the above findings. IMPRESSION: CTA CHEST 1. No evidence of an aortic dissection. 2. No acute findings. 3. Mild coronary artery calcifications. CTA ABDOMEN AND PELVIS 1. 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 2. A short segment of small bowel shows wall thickening and adjacent inflammation. This lies in the pelvis. Given the findings in the superior mesenteric artery, focal small bowel ischemia is suspected. This could, alternatively, be inflammatory or infectious in etiology. No pneumatosis intestinalis or portal venous air. 3. No other significant vascular stenosis. 4. 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 No aneurysm or dissection. Critical Value/emergent results were called by telephone at the time of interpretation on 06/19/2017 at 6:03 pm to Dr. Carrie Mew , who verbally acknowledged these results. Electronically Signed   By: Lajean Manes M.D.   On: 06/19/2017 18:03    ROS:  CONSTITUTIONAL: Denies fevers, chills. Denies any  fatigue, weakness.  EYES: Denies blurry vision, double vision, eye pain. EARS, NOSE, THROAT: Denies tinnitus, ear pain, hearing loss. RESPIRATORY: Denies cough, wheeze, shortness of breath.  CARDIOVASCULAR:  Reporting intermittent episodes of palpitations.Denies chest pain,  edema.  GASTROINTESTINAL: Reporting severe abdominal pain associated with nausea denies any diarrhea or vomiting. Denies bright red blood per rectum. GENITOURINARY: Denies dysuria, hematuria. ENDOCRINE: Denies nocturia or thyroid problems. HEMATOLOGIC AND LYMPHATIC: Denies easy bruising or bleeding. SKIN: Denies rash or lesion. MUSCULOSKELETAL: Denies pain in neck, back, shoulder, knees, hips or arthritic symptoms.  NEUROLOGIC: Denies paralysis, paresthesias.  PSYCHIATRIC: Denies anxiety or depressive symptoms. Blood pressure (!)  198/83, pulse 76, temperature 98 F (36.7 C), temperature source Oral, resp. rate 18, height '5\' 2"'$  (1.575 m), weight 103 kg (227 lb), SpO2 96 %.   PHYSICAL EXAMINATION:  GENERAL: Well-nourished, well-developed  currently in no acute distress.  HEAD: Normocephalic, atraumatic.  EYES: Pupils equal, round, and reactive to light. Extraocular muscles intact. No scleral icterus.  MOUTH: Moist mucosal membranes. Dentition intact. No abscess noted. EARS, NOSE, THROAT: Clear without exudates. No external lesions.  NECK: Supple. No thyromegaly. No nodules. No JVD.  PULMONARY: Clear to auscultation bilaterally without wheezes, rales, or rhonchi. No use of accessory muscles. Good respiratory effort. CHEST: Nontender to palpation.  CARDIOVASCULAR: S1, S2, regular rate and rhythm. No murmurs, rubs, or gallops.  GASTROINTESTINAL: Soft, generalized abdominal tenderness is present ,distended. MUSCULOSKELETAL: No swelling, clubbing, edema. Range of motion full in all extremities. NEUROLOGIC: Cranial nerves II-XII intact. No gross focal neurological deficits. Sensation intact. Reflexes intact. SKIN: No ulcerations, lesions, rash, cyanosis. Skin warm, dry. Turgor intact. PSYCHIATRIC: Mood, affect within normal limits. Patient awake, alert, oriented x 3. Insight and judgment intact.   Assessment/Plan:  Carla [name] is an 56 y.o. female. With past medical history of GERD, hypertension, hyperlipidemia and morbid obesity came into the ED with a chief complaint of generalized abdominal pain for one week which has been worse for 3 days associated with nausea. Denies any hematemesis. Patient is admitted to surgery for ischemic bowel and hospitalist team is consulted for elevated blood pressure and palpitations.   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 Rosana Hoes is planning to do surgery tonight On heparin drip Vascular surgery was consulted  GI prophylaxis with Protonix and IV DVT prophylaxis patient is on heparin drip  Thank you Dr. Rosana Hoes for allowing hospitalist team to take care of the patient  TOTAL TIME TAKING CARE OF THIS PATIENT: 41 minutes.   Note: This dictation was prepared with Dragon dictation along with smaller phrase technology. Any transcriptional errors that result from this process are unintentional.   '@[facility]'$ @ Pager - [phone] 06/19/2017, 8:18 PM

## 2017-06-19 NOTE — ED Notes (Signed)
Pt told xray she has to lay down.  Pt placed in subwait for comfort. Given pillow.

## 2017-06-19 NOTE — Progress Notes (Addendum)
ADDENDUM: Patient again seen and examined, reports her abdomen "still hurts" but "less" >3 hours since her last pain medication administered (interval between requests for pain medication increasing). On exam, former less severe lower abdominal pain now completely resolved, though still moderate focal upper abdominal tenderness to palpation, no generalized peritonitis. If patient continues to slowly improve without resolution of abdominal pain, may favor diagnostic laparoscopy to assess for still suspected small bowel ischemia despite 1 week duration of symptoms, normal lactate, and slowly but incompletely improving abdominal exam on heparin anticoagulation. Continue IVF, abx, and check morning WBC.  RN asked to call if patient's pain worsens or if abnormal VS.  -- [name] E. Rosana Hoes, MD, [place]: [facility] - Partnering for exceptional care. Office: [place] Hospital Day(s): 0.   Post op day(s):  [name]   Interval History: Patient seen and examined with RN and family at bedside. Patient reports her pain has improved and is now better controlled (though it has not completely resolved) with less medication than she received while in the ED, at which time her pain was not well controlled despite pain medications. Her BP and HR have also normalized. She now requests ice chips or a moist towel to wet her mouth and denies N/V, fever/chills, CP, or SOB.  Review of Systems:  Constitutional: denies fever, chills  HEENT: denies cough or congestion  Respiratory: denies any shortness of breath  Cardiovascular: denies chest pain or palpitations  Gastrointestinal: abdominal pain, N/V, and bowel function as per interval history Genitourinary: denies burning with urination or urinary frequency Musculoskeletal: denies pain, decreased motor or sensation Integumentary: denies any other rashes or skin discolorations Neurological: denies  HA or vision/hearing changes   Vital signs in last 24 hours: [min-max] current  Temp:  [98 F (36.7 C)-98.9 F (37.2 C)] 98.9 F (37.2 C) (08/28 2136) Pulse Rate:  [66-83] 80 (08/28 2300) Resp:  [11-20] 15 (08/28 2300) BP: (128-198)/(67-106) 128/67 (08/28 2300) SpO2:  [94 %-100 %] 96 % (08/28 2300) Weight:  [225 lb 8.5 oz (102.3 kg)-227 lb (103 kg)] 225 lb 8.5 oz (102.3 kg) (08/28 2136)     Height: 5\' 2"  (157.5 cm) Weight: 225 lb 8.5 oz (102.3 kg) BMI (Calculated): 41.24   Intake/Output this shift:  Total I/O In: 1089.6 [I.V.:39.6; IV Piggyback:1050] Out: -    Intake/Output last 2 shifts:  @IOLAST2SHIFTS @   Physical Exam:  Constitutional: alert, cooperative and no distress  HENT: normocephalic without obvious abnormality  Eyes: PERRL, EOM's grossly intact and symmetric  Neuro: CN II - XII grossly intact and symmetric without deficit  Respiratory: breathing non-labored at rest  Cardiovascular: regular rate and sinus rhythm Gastrointestinal: soft and non-distended with moderate upper abdominal tenderness to palpation Musculoskeletal: UE and LE FROM, motor and sensation grossly intact, NT  Pulse/Doppler Exam: (p=palpable; d=doppler signals; 0=none)     Right   Left   Brach  p   p   Rad  p   p   Fem  p   p   DP  p   p   Labs:  CBC Latest Ref Rng & Units 06/19/2017 06/18/2017 07/23/2016  WBC 3.6 - 11.0 K/uL 17.0(H) 13.5(H) 7.8  Hemoglobin 12.0 - 16.0 g/dL 14.4 14.3 12.7  Hematocrit 35.0 - 47.0 % 43.5 42.7 38.9  Platelets 150 - 440 K/uL 317 294 247   CMP Latest Ref Rng & Units 06/19/2017 06/18/2017 07/23/2016  Glucose  65 - 99 mg/dL 140(H) 167(H) 109(H)  BUN 6 - 20 mg/dL 15 16 16   Creatinine 0.44 - 1.00 mg/dL 0.74 1.36(H) 0.80  Sodium 135 - 145 mmol/L 134(L) 136 138  Potassium 3.5 - 5.1 mmol/L 3.3(L) 3.4(L) 4.1  Chloride 101 - 111 mmol/L 97(L) 98(L) 106  CO2 22 - 32 mmol/L 24 26 24   Calcium 8.9 - 10.3 mg/dL 9.6 9.6 8.7(L)  Total Protein 6.5 - 8.1 g/dL - 8.4(H) 7.0  Total  Bilirubin 0.3 - 1.2 mg/dL - 0.6 0.2(L)  Alkaline Phos 38 - 126 U/L - 135(H) 93  AST 15 - 41 U/L - 23 13(L)  ALT 14 - 54 U/L - 38 13(L)   Imaging studies: No new pertinent imaging studies   Assessment/Plan: (ICD-10's: K55.011) 56 y.o. female with acute/subacute mesenteric ischemia with at least threatened small intestine, complicated by recent heart palpitations (possibly representing new onset of paroxysmal atrial fibrillation) and pertinent comorbidities including morbid obesity (BMI 42), HTN, HLD, chronic SOB with even minimal exertion, GERD, and chronic Bell's palsy.               - NPO, IVF             - therapeutic heparin infusion, IV antibiotics (Zosyn)             - remain concerned regarding need for surgical intervention with likely small bowel resection and SMA embolectomy vs endovascular intervention, less likely arterial bypass, discussed with patient and her family, all of their questions were answered to their expressed satisfaction, patient expresses she wishes to proceed if condition warrants, and informed consent was obtained.  - will continue to monitor abdominal exam as long as continues to improve, suggesting threatened bowel without necrosis  - discussed with and will contact Dr. Lucky Cowboy (vascular surgery) if patient's condition worsens, requires operative intervention             - medical management and optimization of HTN, palpitations, SOB, and comorbidities  All of the above findings and recommendations were discussed with the patient and her family, and all of her and family's questions were answered to their expressed satisfaction.  -- Marilynne Drivers Rosana Hoes, MD, [place]: [facility] - Partnering for exceptional care. Office: [phone]

## 2017-06-19 NOTE — Telephone Encounter (Signed)
Called patient due to lwot to inquire about condition and follow up plans. Says she cant see her doctor because they are in [place] and it's too far away.  She continues to have pain and is tearful on phone.  She plans to return to the ED.

## 2017-06-19 NOTE — ED Provider Notes (Addendum)
[facility] Emergency Department Provider Note  ____________________________________________  Time seen: Approximately 6:15 PM  I have reviewed the triage vital signs and the nursing notes.   HISTORY  Chief Complaint Chest Pain    HPI Carla [name] is a 56 y.o. female who complains of sudden onset onset of generalized abdominal pain3 days ago. No aggravating or alleviating factors Associated with nausea and vomiting. pAin is sharp and severe.No hematemesis or bloody stool. Has chills . Over the last 2 days the pain radiates into the chest and to the back. No dizziness syncope vision changes numbness tingling weakness or headaches. Patient denies cough runny nose or sore throat. No sick contacts.     Past Medical History:  Diagnosis Date  . Bell's palsy   . GERD (gastroesophageal reflux disease)   . Hyperlipidemia   . Hypertension   . Morbid obesity with BMI of 40.0-44.9, adult [facility])      Patient Active Problem List   Diagnosis Date Noted  . Chest pain 08/06/2012     Past Surgical History:  Procedure Laterality Date  . CHOLECYSTECTOMY    . VAGINAL HYSTERECTOMY       Prior to Admission medications   Medication Sig Start Date End Date Taking? Authorizing Provider  aspirin EC 81 MG tablet Take 81 mg by mouth daily.    [provider]  atorvastatin (LIPITOR) 40 MG tablet Take 40 mg by mouth at bedtime.     [provider]  benzonatate (TESSALON PERLES) 100 MG capsule Take 1 capsule (100 mg total) by mouth 3 (three) times daily as needed for cough. 03/24/17   Sable Feil, PA-C  citalopram (CELEXA) 20 MG tablet Take 20 mg by mouth daily. Reported on 01/24/2016    [provider]  cyclobenzaprine (FLEXERIL) 10 MG tablet Take 1 tablet (10 mg total) by mouth 3 (three) times daily as needed. 05/16/17   Sable Feil, PA-C  cyclobenzaprine (FLEXERIL) 5 MG tablet Take 1 tablet (5 mg total) by mouth 3 (three) times daily as  needed for muscle spasms. 08/06/16   Menshew, Dannielle Karvonen, PA-C  diclofenac (VOLTAREN) 50 MG EC tablet Take 1 tablet (50 mg total) by mouth 2 (two) times daily. 10/25/16   Menshew, Dannielle Karvonen, PA-C  gabapentin (NEURONTIN) 300 MG capsule Take 300 mg by mouth 2 (two) times daily.    [provider]  ketorolac (TORADOL) 10 MG tablet Take 1 tablet (10 mg total) by mouth every 8 (eight) hours. 08/06/16   Menshew, Dannielle Karvonen, PA-C  meloxicam (MOBIC) 7.5 MG tablet Take 1 tablet (7.5 mg total) by mouth daily. 05/05/17 05/05/18  Laban Emperor, PA-C  methylPREDNISolone (MEDROL DOSEPAK) 4 MG TBPK tablet Take Tapered dose as directed 05/16/17   Sable Feil, PA-C  metoprolol tartrate (LOPRESSOR) 25 MG tablet Take 25 mg by mouth 2 (two) times daily.    [provider]  naproxen (NAPROSYN) 500 MG tablet Take 1 tablet (500 mg total) by mouth 2 (two) times daily with a meal. 08/03/16   Carrie Mew, MD  predniSONE (DELTASONE) 10 MG tablet Take 6 tablets on day 1, take 5 tablets on day 2, take 4 tablets on day 3, take 3 tablets on day 4, take 2 tablets on day 5, take 1 tablet on day 6 05/05/17   Laban Emperor, PA-C  traMADol (ULTRAM) 50 MG tablet Take 50 mg by mouth every 6 (six) hours as needed.    [provider]     Allergies Patient has no known allergies.   Family History  Problem Relation Age of Onset  . Hypertension Mother   . Heart disease Mother   . Heart attack Mother   . Breast cancer Mother   . Hypertension Father   . Breast cancer Paternal Aunt     Social History Social History  Substance Use Topics  . Smoking status: Current Some Day Smoker    Packs/day: 0.50    Types: Cigarettes  . Smokeless tobacco: Never Used  . Alcohol use No    Review of Systems  Constitutional:   No fever or chills.  ENT:   No sore throat. No rhinorrhea. Cardiovascular:  Positive as above chest painthoutsyncope. Respiratory:   No dyspnea or  cough. Gastrointestinal:   Positive as above abdominal pain and vomiting.  Musculoskeletal:   Negative for focal pain or swelling All other systems reviewed and are negative except as documented above in ROS and HPI.  ____________________________________________   PHYSICAL EXAM:  VITAL SIGNS: ED Triage Vitals  Enc Vitals Group     BP 06/19/17 1318 (!) 136/106     Pulse Rate 06/19/17 1318 83     Resp 06/19/17 1318 19     Temp 06/19/17 1318 98 F (36.7 C)     Temp Source 06/19/17 1318 Oral     SpO2 06/19/17 1318 97 %     Weight 06/19/17 1307 227 lb (103 kg)     Height 06/19/17 1307 5\' 2"  (1.575 m)     Head Circumference --      Peak Flow --      Pain Score 06/19/17 1306 10     Pain Loc --      Pain Edu? --      Excl. in [place]? --     Vital signs reviewed, nursing assessments reviewed.   Constitutional:   Alert and oriented.ncomfortable but not in distress. Obese Eyes:   No scleral icterus.  EOMI. No nystagmus. No conjunctival pallor. PERRL. ENT   Head:   Normocephalic and atraumatic.   Nose:   No congestion/rhinnorhea.    Mouth/Throat:   MMM, no pharyngeal erythema. No peritonsillar mass.    Neck:   No meningismus. Full ROM Hematological/Lymphatic/Immunilogical:   No cervical lymphadenopathy. Cardiovascular:   RRR. Symmetric bilateral radial and DP pulses.  No murmurs.  Respiratory:   Normal respiratory effort without tachypnea/retractions. Breath sounds are clear and equal bilaterally. No wheezes/rales/rhonchi. Gastrointestinal:   Soft with generalized tenderness. Mildly distended. There is rebound without rigidity or guarding Genitourinary:   deferred Musculoskeletal:   Normal range of motion in all extremities. No joint effusions.  No lower extremity tenderness.  No edema. Neurologic:   Normal speech and language.  Motor grossly intact. No gross focal neurologic deficits are appreciated.  Skin:    Skin is warm, dry and intact. No rash noted.  No petechiae,  purpura, or bullae.  ____________________________________________    LABS (pertinent positives/negatives) (all labs ordered are listed, but only abnormal results are displayed) Labs Reviewed  BASIC METABOLIC PANEL - Abnormal; Notable for the following:       Result Value   Sodium 134 (*)    Potassium 3.3 (*)    Chloride 97 (*)    Glucose, Bld 140 (*)    All other components within normal limits  CBC - Abnormal; Notable for the following:    WBC 17.0 (*)    All other components within normal limits  URINALYSIS, COMPLETE (UACMP) WITH MICROSCOPIC - Abnormal; Notable for the following:    Color, Urine AMBER (*)    APPearance CLOUDY (*)    Hgb urine dipstick MODERATE (*)    Ketones, ur 5 (*)    Protein, ur 30 (*)    Bacteria, UA FEW (*)    Squamous Epithelial / LPF 6-30 (*)    All other components within normal limits  TROPONIN I  LIPASE, BLOOD  LACTIC ACID, PLASMA  LACTIC ACID, PLASMA   ____________________________________________   EKG  Interpreted by me  Date: 06/19/2017  Rate: 89  Rhythm: normal sinus rhythm  QRS Axis: normal  Intervals: normal  ST/T Wave abnormalities: normal  Conduction Disutrbances: none  Narrative Interpretation: unremarkable      ____________________________________________    RADIOLOGY  Dg Chest 2 View  Result Date: 06/19/2017 CLINICAL DATA:  Left chest pain for 3 days EXAM: CHEST  2 VIEW COMPARISON:  June 18, 2017 FINDINGS: The heart size and mediastinal contours are within normal limits. There is no focal infiltrate, pulmonary edema, or pleural effusion. Degenerative joint changes of the spine are noted. Prior cholecystectomy clips are noted. IMPRESSION: No active cardiopulmonary disease. Electronically Signed   By: Abelardo Diesel M.D.   On: 06/19/2017 13:54   Dg Chest 2 View  Result Date: 06/18/2017 CLINICAL DATA:  Acute shortness of breath. EXAM: CHEST  2 VIEW COMPARISON:  03/24/2017 and prior radiographs FINDINGS: The  cardiomediastinal silhouette is unremarkable. There is no evidence of focal airspace disease, pulmonary edema, suspicious pulmonary nodule/mass, pleural effusion, or pneumothorax. No acute bony abnormalities are identified. IMPRESSION: No active cardiopulmonary disease. Electronically Signed   By: Margarette Canada M.D.   On: 06/18/2017 20:38   Ct Angio Chest Aorta W And/or Wo Contrast  Result Date: 06/19/2017 CLINICAL DATA:  Pt in with co left sternal chest pain x 2 days no cardiac history. States has had some shob and nausea with pain. EXAM: CT ANGIOGRAPHY CHEST, ABDOMEN AND PELVIS TECHNIQUE: Multidetector CT imaging through the chest, abdomen and pelvis was performed using the standard protocol during bolus administration of intravenous contrast. Multiplanar reconstructed images and MIPs were obtained and reviewed to evaluate the vascular anatomy. CONTRAST:  125 mL of Isovue 370 intravenous contrast COMPARISON:  Abdomen and pelvis CT, 04/09/2015 FINDINGS: CTA CHEST FINDINGS Cardiovascular: Heart normal in size and configuration. Mild coronary artery calcifications. Thoracic aorta is normal in caliber. There are minor aortic atherosclerotic calcifications along the arch. Great vessels are normal in caliber. No aortic dissection. Aortic arch branch vessels are widely patent. Mediastinum/Nodes: No neck base, axillary, mediastinal or hilar masses or adenopathy. Trachea is widely patent. Esophagus is unremarkable. Normal thyroid. Lungs/Pleura: Lungs are clear. No pleural effusion or pneumothorax. Musculoskeletal: No fracture or acute finding. No osteoblastic or osteolytic lesions. Review of the MIP images confirms the above findings. CTA ABDOMEN AND PELVIS FINDINGS VASCULAR Aorta: Atherosclerotic disease is noted throughout the abdominal aorta, mostly the infrarenal portion. There is eccentric plaque with a component of thrombus along the right aspect of the infrarenal aorta extending to the bifurcation. No significant  narrowing of the abdominal aorta. No aneurysm. No dissection. Celiac: Patent without evidence of aneurysm, dissection, vasculitis or significant stenosis. SMA: There is low attenuation material extending for 2.2 cm from the origin of the superior mesenteric artery distally. This causes significant narrowing of the vessel, 70-90%. This is likely thrombus. Distal to this, the vessel is widely patent as are its branch vessels. Renals: Single right and 2  left renal arteries are widely patent. IMA: Some plaque is evident at the origin of the inferior mesenteric artery with at least mild narrowing. The vessel is patent. Inflow: Mild atherosclerotic disease noted along the common iliac arteries without significant stenosis. External iliac vessels and common femoral arteries are widely patent. Veins: No obvious venous abnormality within the limitations of this arterial phase study. Review of the MIP images confirms the above findings. NON-VASCULAR Hepatobiliary: Liver is unremarkable. Gallbladder surgically absent. Common bile duct dilated 9 mm with distal tapering. Pancreas: Unremarkable. No pancreatic ductal dilatation or surrounding inflammatory changes. Spleen: Normal in size without focal abnormality. Adrenals/Urinary Tract: There is nodular thickening throughout the left adrenal gland most likely nodular hyperplasia. Multiple small adenomas are possible. Right adrenal gland is unremarkable. Kidneys are normal in size, orientation and position. There is symmetric enhancement. Mild bilateral renal cortical thinning. No renal masses or stones. No hydronephrosis. Normal ureters. Bladder is unremarkable. Stomach/Bowel: There is a single loop of small bowel in the central to upper pelvis with wall thickening and adjacent hazy inflammation. There is no pneumatosis intestinalis. Remainder of the small bowel is normal in caliber with normal wall thickness and no other associated mesenteric inflammation. Stomach and colon are  unremarkable. Appendix not visualized. Lymphatic: No adenopathy. Reproductive: Status post hysterectomy. No adnexal masses. Other: No abdominal wall hernia.  No ascites. Musculoskeletal: No fracture or acute finding. No osteoblastic or osteolytic lesions. Review of the MIP images confirms the above findings. IMPRESSION: CTA CHEST 1. No evidence of an aortic dissection. 2. No acute findings. 3. Mild coronary artery calcifications. CTA ABDOMEN AND PELVIS 1. 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 2. A short segment of small bowel shows wall thickening and adjacent inflammation. This lies in the pelvis. Given the findings in the superior mesenteric artery, focal small bowel ischemia is suspected. This could, alternatively, be inflammatory or infectious in etiology. No pneumatosis intestinalis or portal venous air. 3. No other significant vascular stenosis. 4. 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 No aneurysm or dissection. Critical Value/emergent results were called by telephone at the time of interpretation on 06/19/2017 at 6:03 pm to Dr. Carrie Mew , who verbally acknowledged these results. Electronically Signed   By: Lajean Manes M.D.   On: 06/19/2017 18:03   Ct Angio Abd/pel W And/or Wo Contrast  Result Date: 06/19/2017 CLINICAL DATA:  Pt in with co left sternal chest pain x 2 days no cardiac history. States has had some shob and nausea with pain. EXAM: CT ANGIOGRAPHY CHEST, ABDOMEN AND PELVIS TECHNIQUE: Multidetector CT imaging through the chest, abdomen and pelvis was performed using the standard protocol during bolus administration of intravenous contrast. Multiplanar reconstructed images and MIPs were obtained and reviewed to evaluate the vascular anatomy. CONTRAST:  125 mL of Isovue 370 intravenous contrast COMPARISON:  Abdomen and pelvis CT,  04/09/2015 FINDINGS: CTA CHEST FINDINGS Cardiovascular: Heart normal in size and configuration. Mild coronary artery calcifications. Thoracic aorta is normal in caliber. There are minor aortic atherosclerotic calcifications along the arch. Great vessels are normal in caliber. No aortic dissection. Aortic arch branch vessels are widely patent. Mediastinum/Nodes: No neck base, axillary, mediastinal or hilar masses or adenopathy. Trachea is widely patent. Esophagus is unremarkable. Normal thyroid. Lungs/Pleura: Lungs are clear. No pleural effusion or pneumothorax. Musculoskeletal: No fracture or acute finding. No osteoblastic or osteolytic lesions. Review of the MIP images  confirms the above findings. CTA ABDOMEN AND PELVIS FINDINGS VASCULAR Aorta: Atherosclerotic disease is noted throughout the abdominal aorta, mostly the infrarenal portion. There is eccentric plaque with a component of thrombus along the right aspect of the infrarenal aorta extending to the bifurcation. No significant narrowing of the abdominal aorta. No aneurysm. No dissection. Celiac: Patent without evidence of aneurysm, dissection, vasculitis or significant stenosis. SMA: There is low attenuation material extending for 2.2 cm from the origin of the superior mesenteric artery distally. This causes significant narrowing of the vessel, 70-90%. This is likely thrombus. Distal to this, the vessel is widely patent as are its branch vessels. Renals: Single right and 2 left renal arteries are widely patent. IMA: Some plaque is evident at the origin of the inferior mesenteric artery with at least mild narrowing. The vessel is patent. Inflow: Mild atherosclerotic disease noted along the common iliac arteries without significant stenosis. External iliac vessels and common femoral arteries are widely patent. Veins: No obvious venous abnormality within the limitations of this arterial phase study. Review of the MIP images confirms the above findings.  NON-VASCULAR Hepatobiliary: Liver is unremarkable. Gallbladder surgically absent. Common bile duct dilated 9 mm with distal tapering. Pancreas: Unremarkable. No pancreatic ductal dilatation or surrounding inflammatory changes. Spleen: Normal in size without focal abnormality. Adrenals/Urinary Tract: There is nodular thickening throughout the left adrenal gland most likely nodular hyperplasia. Multiple small adenomas are possible. Right adrenal gland is unremarkable. Kidneys are normal in size, orientation and position. There is symmetric enhancement. Mild bilateral renal cortical thinning. No renal masses or stones. No hydronephrosis. Normal ureters. Bladder is unremarkable. Stomach/Bowel: There is a single loop of small bowel in the central to upper pelvis with wall thickening and adjacent hazy inflammation. There is no pneumatosis intestinalis. Remainder of the small bowel is normal in caliber with normal wall thickness and no other associated mesenteric inflammation. Stomach and colon are unremarkable. Appendix not visualized. Lymphatic: No adenopathy. Reproductive: Status post hysterectomy. No adnexal masses. Other: No abdominal wall hernia.  No ascites. Musculoskeletal: No fracture or acute finding. No osteoblastic or osteolytic lesions. Review of the MIP images confirms the above findings. IMPRESSION: CTA CHEST 1. No evidence of an aortic dissection. 2. No acute findings. 3. Mild coronary artery calcifications. CTA ABDOMEN AND PELVIS 1. 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 2. A short segment of small bowel shows wall thickening and adjacent inflammation. This lies in the pelvis. Given the findings in the superior mesenteric artery, focal small bowel ischemia is suspected. This could, alternatively, be inflammatory or infectious in etiology. No pneumatosis intestinalis or portal venous air. 3. No other significant vascular  stenosis. 4. 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 No aneurysm or dissection. Critical Value/emergent results were called by telephone at the time of interpretation on 06/19/2017 at 6:03 pm to Dr. Carrie Mew , who verbally acknowledged these results. Electronically Signed   By: Lajean Manes M.D.   On: 06/19/2017 18:03    ____________________________________________   PROCEDURES Procedures CRITICAL CARE Performed by: Joni Fears, Shanyce Daris   Total critical care time: 35 minutes  Critical care time was exclusive of separately billable procedures and treating other patients.  Critical care was necessary to treat or prevent imminent or life-threatening deterioration.  Critical care was time spent personally by me on the following activities: development of treatment plan with patient and/or surrogate as well as nursing,  discussions with consultants, evaluation of patient's response to treatment, examination of patient, obtaining history from patient or surrogate, ordering and performing treatments and interventions, ordering and review of laboratory studies, ordering and review of radiographic studies, pulse oximetry and re-evaluation of patient's condition.  ____________________________________________   INITIAL IMPRESSION / ASSESSMENT AND PLAN / ED COURSE  Pertinent labs & imaging results that were available during my care of the patient were reviewed by me and considered in my medical decision making (see chart for details).    Clinical Course as of Jun 19 1829  Tue Jun 19, 2017  1648 P/w gen body aches, gen abd pain radiating to chest and back, with nausea and sob.  Very likely viral syndrome, but will also get CTA c/a/p to eval for dissection. Pt not in distress, nl BP.   [PS]  1811 Scan d/w rads, concern for SMA origin obstruction.  Will d/w surgery.   [PS]    Clinical Course User Index [PS] Carrie Mew, MD    ----------------------------------------- 6:25 PM on 06/19/2017 -----------------------------------------  Discussed with surgery Dr. Burt Knack who will evaluate for possible exploratory laparotomy. We'll check lactate. Dilaudid for continued pain control. Continue IV fluids.We'll hold off anticoagulation briefly until surgery has a chance to evaluate. If she is going emergently to OR They may wish to hold anticoagulation.   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 Dictation errors may occur despite best attempts at proofreading.    Carrie Mew, MD 06/19/17 1830    Carrie Mew, MD 06/19/17 [phone]

## 2017-06-19 NOTE — Consult Note (Signed)
Name: Carla [name] MRN: [id number] DOB: December 19, 1960    ADMISSION DATE:  06/19/2017  CONSULTATION DATE:  06/19/17  REFERRING MD :  Dr. Tama High  CHIEF COMPLAINT:  Abdominal Pain  BRIEF PATIENT DESCRIPTION: 56 year old female with acute subacute mesenteric ischemia with at least threatened small intestine  With new onset of atrial fibrillation.  SIGNIFICANT EVENTS  8/28 >> Patient admitted to the ICU with acute/subacute mesentric ischemia  STUDIES:  06/19/17 CT Angio Abdominal/pelvis>>There is an apparent thrombus or embolus in the proximal superiormesenteric artery causing severe, 70-90%, narrowing.. A short segment of small bowel shows wall thickening and adjacentinflammation. This lies in the pelvis. Given the findings in thesuperior mesenteric artery, focal small bowel ischemia is suspected. This could, alternatively, be inflammatory or infectious inetiology. No pneumatosis intestinalis or portal venous air.. No other significant vascular stenosis.4. Significant atherosclerotic disease is noted of the infrarenalabdominal aorta associated with some mural thrombus. No aneurysm or dissection.  HISTORY OF PRESENT ILLNESS: Carla [name] is a 56 year old female with known history of Bell's Palsy,GERD,Hypertension , Hyperlipidemia and Morbid obesity.  Patient presented to [facility] on 8/28 with worsening abdominal pain.  She thought that her pain is related to constipation and therefore took 4 EX-lAX, Followed by 3 stool softeners after which she passed small BM's after which she had worsening pain.   She continue to pass flatus and experienced new onset of atrial fibrillation.  Patient was admitted to the SDU for further management.  PAST MEDICAL HISTORY :   has a past medical history of Bell's palsy; GERD (gastroesophageal reflux disease); Hyperlipidemia; Hypertension; and Morbid obesity with BMI of 40.0-44.9, adult ([place]).  has a past surgical history that includes Cholecystectomy and Vaginal  hysterectomy. Prior to Admission medications   Medication Sig Start Date End Date Taking? Authorizing Provider  aspirin EC 81 MG tablet Take 81 mg by mouth daily.   Yes [provider]  atorvastatin (LIPITOR) 80 MG tablet Take 80 mg by mouth at bedtime.   Yes [provider]  gabapentin (NEURONTIN) 300 MG capsule Take 300 mg by mouth 2 (two) times daily.   Yes [provider]  lisinopril (PRINIVIL,ZESTRIL) 40 MG tablet Take 40 mg by mouth daily.   Yes [provider]  metoprolol tartrate (LOPRESSOR) 25 MG tablet Take 25 mg by mouth 2 (two) times daily.   Yes [provider]  omeprazole (PRILOSEC) 20 MG capsule Take 20 mg by mouth 2 (two) times daily.   Yes [provider]  traMADol (ULTRAM) 50 MG tablet Take 50 mg by mouth every 6 (six) hours as needed for moderate pain.    Yes [provider]   No Known Allergies  FAMILY HISTORY:  family history includes Breast cancer in her mother and paternal aunt; Heart attack in her mother; Heart disease in her mother; Hypertension in her father and mother. SOCIAL HISTORY:  reports that she has been smoking Cigarettes.  She has been smoking about 0.50 packs per day. She has never used smokeless tobacco. She reports that she does not drink alcohol or use drugs.  REVIEW OF SYSTEMS:   Constitutional: Negative for fever, chills, weight loss, malaise/fatigue and diaphoresis.  HENT: Negative for hearing loss, ear pain, nosebleeds, congestion, sore throat, neck pain, tinnitus and ear discharge.   Eyes: Negative for blurred vision, double vision, photophobia, pain, discharge and redness.  Respiratory: Negative for cough, hemoptysis, sputum production, shortness of breath, wheezing and stridor.   Cardiovascular: Negative for  chest pain, palpitations, orthopnea, claudication, leg swelling and PND.  Gastrointestinal: Negative for heartburn, nausea, vomiting, abdominal pain, diarrhea, constipation, blood  in stool and melena.  Genitourinary: Negative for dysuria, urgency, frequency, hematuria and flank pain.  Musculoskeletal: Negative for myalgias, back pain, joint pain and falls.  Skin: Negative for itching and rash.  Neurological: Negative for dizziness, tingling, tremors, sensory change, speech change, focal weakness, seizures, loss of consciousness, weakness and headaches.  Endo/Heme/Allergies: Negative for environmental allergies and polydipsia. Does not bruise/bleed easily.  SUBJECTIVE: Patient was tearful and complained on pain even on light palpation.  VITAL SIGNS: Temp:  [98 F (36.7 C)-98.9 F (37.2 C)] 98.9 F (37.2 C) (08/28 2136) Pulse Rate:  [66-83] 80 (08/28 2300) Resp:  [11-20] 15 (08/28 2300) BP: (128-198)/(67-106) 128/67 (08/28 2300) SpO2:  [94 %-100 %] 96 % (08/28 2300) Weight:  [102.3 kg (225 lb 8.5 oz)-103 kg (227 lb)] 102.3 kg (225 lb 8.5 oz) (08/28 2136)  PHYSICAL EXAMINATION: General:  Middle aged white female, found on bed, tearful Neuro:  Awake,Alert and oriented HEENT: AT,[place],No jvd Cardiovascular: s1s2, regular,no m/r/g Lungs:  Clear bilaterally, no wheezes,crackles,rhonchi Abdomen: Tender on light palpation, no guarding or rebound tenderness. Musculoskeletal: no edema/cyanosis Skin: warm.dry and Intact   Recent Labs Lab 06/18/17 1954 06/19/17 1314  NA 136 134*  K 3.4* 3.3*  CL 98* 97*  CO2 26 24  BUN 16 15  CREATININE 1.36* 0.74  GLUCOSE 167* 140*    Recent Labs Lab 06/18/17 1954 06/19/17 1314  HGB 14.3 14.4  HCT 42.7 43.5  WBC 13.5* 17.0*  PLT 294 317   Dg Chest 2 View  Result Date: 06/19/2017 CLINICAL DATA:  Left chest pain for 3 days EXAM: CHEST  2 VIEW COMPARISON:  June 18, 2017 FINDINGS: The heart size and mediastinal contours are within normal limits. There is no focal infiltrate, pulmonary edema, or pleural effusion. Degenerative joint changes of the spine are noted. Prior cholecystectomy clips are noted. IMPRESSION: No active  cardiopulmonary disease. Electronically Signed   By: Abelardo Diesel M.D.   On: 06/19/2017 13:54   Dg Chest 2 View  Result Date: 06/18/2017 CLINICAL DATA:  Acute shortness of breath. EXAM: CHEST  2 VIEW COMPARISON:  03/24/2017 and prior radiographs FINDINGS: The cardiomediastinal silhouette is unremarkable. There is no evidence of focal airspace disease, pulmonary edema, suspicious pulmonary nodule/mass, pleural effusion, or pneumothorax. No acute bony abnormalities are identified. IMPRESSION: No active cardiopulmonary disease. Electronically Signed   By: Margarette Canada M.D.   On: 06/18/2017 20:38   Ct Angio Chest Aorta W And/or Wo Contrast  Result Date: 06/19/2017 CLINICAL DATA:  Pt in with co left sternal chest pain x 2 days no cardiac history. States has had some shob and nausea with pain. EXAM: CT ANGIOGRAPHY CHEST, ABDOMEN AND PELVIS TECHNIQUE: Multidetector CT imaging through the chest, abdomen and pelvis was performed using the standard protocol during bolus administration of intravenous contrast. Multiplanar reconstructed images and MIPs were obtained and reviewed to evaluate the vascular anatomy. CONTRAST:  125 mL of Isovue 370 intravenous contrast COMPARISON:  Abdomen and pelvis CT, 04/09/2015 FINDINGS: CTA CHEST FINDINGS Cardiovascular: Heart normal in size and configuration. Mild coronary artery calcifications. Thoracic aorta is normal in caliber. There are minor aortic atherosclerotic calcifications along the arch. Great vessels are normal in caliber. No aortic dissection. Aortic arch branch vessels are widely patent. Mediastinum/Nodes: No neck base, axillary, mediastinal or hilar masses or adenopathy. Trachea is widely patent. Esophagus is unremarkable. Normal thyroid. Lungs/Pleura:  Lungs are clear. No pleural effusion or pneumothorax. Musculoskeletal: No fracture or acute finding. No osteoblastic or osteolytic lesions. Review of the MIP images confirms the above findings. CTA ABDOMEN AND PELVIS  FINDINGS VASCULAR Aorta: Atherosclerotic disease is noted throughout the abdominal aorta, mostly the infrarenal portion. There is eccentric plaque with a component of thrombus along the right aspect of the infrarenal aorta extending to the bifurcation. No significant narrowing of the abdominal aorta. No aneurysm. No dissection. Celiac: Patent without evidence of aneurysm, dissection, vasculitis or significant stenosis. SMA: There is low attenuation material extending for 2.2 cm from the origin of the superior mesenteric artery distally. This causes significant narrowing of the vessel, 70-90%. This is likely thrombus. Distal to this, the vessel is widely patent as are its branch vessels. Renals: Single right and 2 left renal arteries are widely patent. IMA: Some plaque is evident at the origin of the inferior mesenteric artery with at least mild narrowing. The vessel is patent. Inflow: Mild atherosclerotic disease noted along the common iliac arteries without significant stenosis. External iliac vessels and common femoral arteries are widely patent. Veins: No obvious venous abnormality within the limitations of this arterial phase study. Review of the MIP images confirms the above findings. NON-VASCULAR Hepatobiliary: Liver is unremarkable. Gallbladder surgically absent. Common bile duct dilated 9 mm with distal tapering. Pancreas: Unremarkable. No pancreatic ductal dilatation or surrounding inflammatory changes. Spleen: Normal in size without focal abnormality. Adrenals/Urinary Tract: There is nodular thickening throughout the left adrenal gland most likely nodular hyperplasia. Multiple small adenomas are possible. Right adrenal gland is unremarkable. Kidneys are normal in size, orientation and position. There is symmetric enhancement. Mild bilateral renal cortical thinning. No renal masses or stones. No hydronephrosis. Normal ureters. Bladder is unremarkable. Stomach/Bowel: There is a single loop of small bowel in  the central to upper pelvis with wall thickening and adjacent hazy inflammation. There is no pneumatosis intestinalis. Remainder of the small bowel is normal in caliber with normal wall thickness and no other associated mesenteric inflammation. Stomach and colon are unremarkable. Appendix not visualized. Lymphatic: No adenopathy. Reproductive: Status post hysterectomy. No adnexal masses. Other: No abdominal wall hernia.  No ascites. Musculoskeletal: No fracture or acute finding. No osteoblastic or osteolytic lesions. Review of the MIP images confirms the above findings. IMPRESSION: CTA CHEST 1. No evidence of an aortic dissection. 2. No acute findings. 3. Mild coronary artery calcifications. CTA ABDOMEN AND PELVIS 1. 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 2. A short segment of small bowel shows wall thickening and adjacent inflammation. This lies in the pelvis. Given the findings in the superior mesenteric artery, focal small bowel ischemia is suspected. This could, alternatively, be inflammatory or infectious in etiology. No pneumatosis intestinalis or portal venous air. 3. No other significant vascular stenosis. 4. 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 No aneurysm or dissection. Critical Value/emergent results were called by telephone at the time of interpretation on 06/19/2017 at 6:03 pm to Dr. Carrie Mew , who verbally acknowledged these results. Electronically Signed   By: Lajean Manes M.D.   On: 06/19/2017 18:03   Ct Angio Abd/pel W And/or Wo Contrast  Result Date: 06/19/2017 CLINICAL DATA:  Pt in with co left sternal chest pain x 2 days no cardiac history. States has had some shob and nausea with pain. EXAM: CT ANGIOGRAPHY CHEST, ABDOMEN AND PELVIS TECHNIQUE: Multidetector CT imaging  through the chest, abdomen and pelvis was performed using the  standard protocol during bolus administration of intravenous contrast. Multiplanar reconstructed images and MIPs were obtained and reviewed to evaluate the vascular anatomy. CONTRAST:  125 mL of Isovue 370 intravenous contrast COMPARISON:  Abdomen and pelvis CT, 04/09/2015 FINDINGS: CTA CHEST FINDINGS Cardiovascular: Heart normal in size and configuration. Mild coronary artery calcifications. Thoracic aorta is normal in caliber. There are minor aortic atherosclerotic calcifications along the arch. Great vessels are normal in caliber. No aortic dissection. Aortic arch branch vessels are widely patent. Mediastinum/Nodes: No neck base, axillary, mediastinal or hilar masses or adenopathy. Trachea is widely patent. Esophagus is unremarkable. Normal thyroid. Lungs/Pleura: Lungs are clear. No pleural effusion or pneumothorax. Musculoskeletal: No fracture or acute finding. No osteoblastic or osteolytic lesions. Review of the MIP images confirms the above findings. CTA ABDOMEN AND PELVIS FINDINGS VASCULAR Aorta: Atherosclerotic disease is noted throughout the abdominal aorta, mostly the infrarenal portion. There is eccentric plaque with a component of thrombus along the right aspect of the infrarenal aorta extending to the bifurcation. No significant narrowing of the abdominal aorta. No aneurysm. No dissection. Celiac: Patent without evidence of aneurysm, dissection, vasculitis or significant stenosis. SMA: There is low attenuation material extending for 2.2 cm from the origin of the superior mesenteric artery distally. This causes significant narrowing of the vessel, 70-90%. This is likely thrombus. Distal to this, the vessel is widely patent as are its branch vessels. Renals: Single right and 2 left renal arteries are widely patent. IMA: Some plaque is evident at the origin of the inferior mesenteric artery with at least mild narrowing. The vessel is patent. Inflow: Mild atherosclerotic disease noted along the common iliac  arteries without significant stenosis. External iliac vessels and common femoral arteries are widely patent. Veins: No obvious venous abnormality within the limitations of this arterial phase study. Review of the MIP images confirms the above findings. NON-VASCULAR Hepatobiliary: Liver is unremarkable. Gallbladder surgically absent. Common bile duct dilated 9 mm with distal tapering. Pancreas: Unremarkable. No pancreatic ductal dilatation or surrounding inflammatory changes. Spleen: Normal in size without focal abnormality. Adrenals/Urinary Tract: There is nodular thickening throughout the left adrenal gland most likely nodular hyperplasia. Multiple small adenomas are possible. Right adrenal gland is unremarkable. Kidneys are normal in size, orientation and position. There is symmetric enhancement. Mild bilateral renal cortical thinning. No renal masses or stones. No hydronephrosis. Normal ureters. Bladder is unremarkable. Stomach/Bowel: There is a single loop of small bowel in the central to upper pelvis with wall thickening and adjacent hazy inflammation. There is no pneumatosis intestinalis. Remainder of the small bowel is normal in caliber with normal wall thickness and no other associated mesenteric inflammation. Stomach and colon are unremarkable. Appendix not visualized. Lymphatic: No adenopathy. Reproductive: Status post hysterectomy. No adnexal masses. Other: No abdominal wall hernia.  No ascites. Musculoskeletal: No fracture or acute finding. No osteoblastic or osteolytic lesions. Review of the MIP images confirms the above findings. IMPRESSION: CTA CHEST 1. No evidence of an aortic dissection. 2. No acute findings. 3. Mild coronary artery calcifications. CTA ABDOMEN AND PELVIS 1. 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 2. A short segment of small bowel shows wall thickening and adjacent inflammation. This lies in the pelvis.  Given the findings in the superior mesenteric artery, focal small bowel ischemia is suspected. This could, alternatively, be inflammatory or infectious in etiology. No pneumatosis intestinalis or portal venous air. 3.  No other significant vascular stenosis. 4. 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 No aneurysm or dissection. Critical Value/emergent results were called by telephone at the time of interpretation on 06/19/2017 at 6:03 pm to Dr. Carrie Mew , who verbally acknowledged these results. Electronically Signed   By: Lajean Manes M.D.   On: 06/19/2017 18:03    ASSESSMENT / PLAN:  - acute/subacute mesenteric ischemia with at least threatened small intestine -thrombus or embolus in the proximal superiormesenteric artery causing severe, 70-90%, narrowing. - New onset of Atrial fibrillation - Hx of Hypertension/Hyperlipidemia - Hx of GERD - Abdominal Pain  Plan Surgery following the patient Keep Patient NPO Continue IVF Continue Heparin gtt Continue I/v zosyn F/U echo Cardiology consulted for new onset of afib Continue Hydromorphone Continue Atrorvastain Continue metoprolol/lisinopril     Suzan Manon,AG-ACNP Pulmonary and [place]   06/19/2017, 11:50 PM

## 2017-06-20 ENCOUNTER — Encounter
Admission: EM | Disposition: A | Discharge status: Home / Self Care | Payer: Self-pay | Source: Home / Self Care | Attending: Surgery

## 2017-06-20 ENCOUNTER — Inpatient Hospital Stay: Admit: 2017-06-20 | Payer: Medicaid Other

## 2017-06-20 ENCOUNTER — Inpatient Hospital Stay (HOSPITAL_COMMUNITY)
Admit: 2017-06-20 | Discharge: 2017-06-20 | Disposition: A | Discharge status: Home / Self Care | Payer: Medicaid Other | Attending: Vascular Surgery | Admitting: Vascular Surgery

## 2017-06-20 DIAGNOSIS — R000 Tachycardia, unspecified: Secondary | ICD-10-CM

## 2017-06-20 DIAGNOSIS — K55011 Focal (segmental) acute (reversible) ischemia of small intestine: Principal | ICD-10-CM

## 2017-06-20 DIAGNOSIS — R1084 Generalized abdominal pain: Secondary | ICD-10-CM

## 2017-06-20 DIAGNOSIS — K551 Chronic vascular disorders of intestine: Secondary | ICD-10-CM

## 2017-06-20 DIAGNOSIS — R002 Palpitations: Secondary | ICD-10-CM

## 2017-06-20 DIAGNOSIS — R109 Unspecified abdominal pain: Secondary | ICD-10-CM

## 2017-06-20 DIAGNOSIS — K55059 Acute (reversible) ischemia of intestine, part and extent unspecified: Secondary | ICD-10-CM

## 2017-06-20 HISTORY — PX: VISCERAL ARTERY INTERVENTION: CATH118277

## 2017-06-20 LAB — BASIC METABOLIC PANEL
Anion gap: 8 (ref 5–15)
BUN: 11 mg/dL (ref 6–20)
CO2: 26 mmol/L (ref 22–32)
Calcium: 8.4 mg/dL — ABNORMAL LOW (ref 8.9–10.3)
Chloride: 105 mmol/L (ref 101–111)
Creatinine, Ser: 0.63 mg/dL (ref 0.44–1.00)
GFR calc Af Amer: >60 mL/min (ref >60)
GFR calc non Af Amer: >60 mL/min (ref >60)
Glucose, Bld: 113 mg/dL — ABNORMAL HIGH (ref 65–99)
Potassium: 3.1 mmol/L — ABNORMAL LOW (ref 3.5–5.1)
Sodium: 139 mmol/L (ref 135–145)

## 2017-06-20 LAB — LACTIC ACID, PLASMA
Lactic Acid, Venous: 1.5 mmol/L (ref 0.5–1.9)
Lactic Acid, Venous: 2.9 mmol/L (ref 0.5–1.9)
Lactic Acid, Venous: 0.8 mmol/L (ref 0.5–1.9)

## 2017-06-20 LAB — ECHOCARDIOGRAM COMPLETE
Height: 62 in
Weight: 3600 oz

## 2017-06-20 LAB — CBC
HCT: 36.1 % (ref 35.0–47.0)
Hemoglobin: 12.3 g/dL (ref 12.0–16.0)
MCH: 30.1 pg (ref 26.0–34.0)
MCHC: 34 g/dL (ref 32.0–36.0)
MCV: 88.5 fL (ref 80.0–100.0)
Platelets: 240 10*3/uL (ref 150–440)
RBC: 4.08 MIL/uL (ref 3.80–5.20)
RDW: 14.6 % — ABNORMAL HIGH (ref 11.5–14.5)
WBC: 13.5 10*3/uL — ABNORMAL HIGH (ref 3.6–11.0)

## 2017-06-20 LAB — HEPARIN LEVEL (UNFRACTIONATED)
Heparin Unfractionated: 0.28 IU/mL — ABNORMAL LOW (ref 0.30–0.70)
Heparin Unfractionated: 0.52 IU/mL (ref 0.30–0.70)
Heparin Unfractionated: 0.43 IU/mL (ref 0.30–0.70)

## 2017-06-20 LAB — TSH: TSH: 3.779 u[IU]/mL (ref 0.350–4.500)

## 2017-06-20 LAB — POTASSIUM: Potassium: 4.1 mmol/L (ref 3.5–5.1)

## 2017-06-20 LAB — MAGNESIUM: Magnesium: 2.4 mg/dL (ref 1.7–2.4)

## 2017-06-20 SURGERY — VISCERAL ARTERY INTERVENTION
Anesthesia: Moderate Sedation

## 2017-06-20 MED ORDER — HEPARIN SODIUM (PORCINE) 1000 UNIT/ML IJ SOLN
INTRAMUSCULAR | Status: AC
  Filled 2017-06-20: qty 1

## 2017-06-20 MED ORDER — LIDOCAINE-EPINEPHRINE (PF) 2 %-1:200000 IJ SOLN
INTRAMUSCULAR | Status: AC
  Filled 2017-06-20: qty 20

## 2017-06-20 MED ORDER — HEPARIN BOLUS VIA INFUSION
1100.0000 [IU] | Freq: Once | INTRAVENOUS | Status: AC
  Administered 2017-06-20: 1100 [IU] via INTRAVENOUS
  Filled 2017-06-20: qty 1100

## 2017-06-20 MED ORDER — MIDAZOLAM HCL 5 MG/5ML IJ SOLN
INTRAMUSCULAR | Status: AC
  Filled 2017-06-20: qty 5

## 2017-06-20 MED ORDER — SODIUM CHLORIDE 0.9 % IV SOLN: INTRAVENOUS | Status: DC

## 2017-06-20 MED ORDER — FENTANYL CITRATE (PF) 100 MCG/2ML IJ SOLN
INTRAMUSCULAR | Status: AC
  Filled 2017-06-20: qty 2

## 2017-06-20 MED ORDER — METOPROLOL TARTRATE 25 MG PO TABS
25.0000 mg | ORAL_TABLET | Freq: Two times a day (BID) | ORAL | Status: DC
  Administered 2017-06-20 – 2017-06-22 (×5): 25 mg via ORAL
  Filled 2017-06-20 (×6): qty 1

## 2017-06-20 MED ORDER — ALTEPLASE 2 MG IJ SOLR
INTRAMUSCULAR | Status: AC
  Filled 2017-06-20: qty 4

## 2017-06-20 MED ORDER — FENTANYL CITRATE (PF) 100 MCG/2ML IJ SOLN
INTRAMUSCULAR | Status: DC | PRN
  Administered 2017-06-20 (×3): 50 ug via INTRAVENOUS
  Administered 2017-06-20 (×2): 25 ug via INTRAVENOUS

## 2017-06-20 MED ORDER — POTASSIUM CHLORIDE 10 MEQ/100ML IV SOLN
10.0000 meq | INTRAVENOUS | Status: AC
  Administered 2017-06-20 (×4): 10 meq via INTRAVENOUS
  Filled 2017-06-20 (×4): qty 100

## 2017-06-20 MED ORDER — ATORVASTATIN CALCIUM 20 MG PO TABS
80.0000 mg | ORAL_TABLET | Freq: Every day | ORAL | Status: DC
  Administered 2017-06-21 – 2017-06-27 (×7): 80 mg via ORAL
  Filled 2017-06-20 (×8): qty 4

## 2017-06-20 MED ORDER — PIPERACILLIN-TAZOBACTAM 3.375 G IVPB
3.3750 g | Freq: Three times a day (TID) | INTRAVENOUS | Status: DC
  Administered 2017-06-20 – 2017-06-24 (×13): 3.375 g via INTRAVENOUS
  Filled 2017-06-20 (×14): qty 50

## 2017-06-20 MED ORDER — HEPARIN (PORCINE) IN NACL 2-0.9 UNIT/ML-% IJ SOLN
INTRAMUSCULAR | Status: AC
  Filled 2017-06-20: qty 1000

## 2017-06-20 MED ORDER — HEPARIN SODIUM (PORCINE) 1000 UNIT/ML IJ SOLN
INTRAMUSCULAR | Status: DC | PRN
  Administered 2017-06-20: 5000 [IU] via INTRAVENOUS

## 2017-06-20 MED ORDER — ASPIRIN EC 81 MG PO TBEC
81.0000 mg | DELAYED_RELEASE_TABLET | Freq: Every day | ORAL | Status: DC
  Administered 2017-06-20 – 2017-06-28 (×8): 81 mg via ORAL
  Filled 2017-06-20 (×9): qty 1

## 2017-06-20 MED ORDER — MIDAZOLAM HCL 2 MG/2ML IJ SOLN
INTRAMUSCULAR | Status: DC | PRN
  Administered 2017-06-20: 1 mg via INTRAVENOUS
  Administered 2017-06-20: 2 mg via INTRAVENOUS
  Administered 2017-06-20: 1 mg via INTRAVENOUS

## 2017-06-20 MED ORDER — MORPHINE SULFATE (PF) 2 MG/ML IV SOLN
INTRAVENOUS | Status: AC
  Filled 2017-06-20: qty 1

## 2017-06-20 MED ORDER — MAGNESIUM SULFATE 2 GM/50ML IV SOLN
2.0000 g | Freq: Once | INTRAVENOUS | Status: AC
  Administered 2017-06-20: 2 g via INTRAVENOUS
  Filled 2017-06-20: qty 50

## 2017-06-20 MED ORDER — IOPAMIDOL (ISOVUE-300) INJECTION 61%
INTRAVENOUS | Status: DC | PRN
  Administered 2017-06-20: 55 mL via INTRAVENOUS

## 2017-06-20 MED ORDER — CEFUROXIME SODIUM 1.5 G IV SOLR
1.5000 g | INTRAVENOUS | Status: AC
  Administered 2017-06-20: 1.5 g via INTRAVENOUS
  Filled 2017-06-20: qty 1.5

## 2017-06-20 MED ORDER — MORPHINE SULFATE (PF) 2 MG/ML IV SOLN
2.0000 mg | INTRAVENOUS | Status: DC | PRN
  Administered 2017-06-20 – 2017-06-21 (×7): 2 mg via INTRAVENOUS
  Filled 2017-06-20 (×6): qty 1

## 2017-06-20 MED ORDER — ALTEPLASE 2 MG IJ SOLR
INTRAMUSCULAR | Status: DC | PRN
  Administered 2017-06-20: 4 mg

## 2017-06-20 MED ORDER — LISINOPRIL 20 MG PO TABS
40.0000 mg | ORAL_TABLET | Freq: Every day | ORAL | Status: DC
  Administered 2017-06-20 – 2017-06-21 (×2): 40 mg via ORAL
  Filled 2017-06-20 (×2): qty 2

## 2017-06-20 SURGICAL SUPPLY — 20 items
CANISTER PENUMBRA MAX (MISCELLANEOUS) ×2 IMPLANT
CATH BEACON 5 .035 65 KMP TIP (CATHETERS) ×2 IMPLANT
CATH C2 65CM (CATHETERS) ×2 IMPLANT
CATH INDIGO 6 ST-TIP 135CM (CATHETERS) ×2 IMPLANT
CATH PIG 70CM (CATHETERS) ×2 IMPLANT
COVER PROBE U/S 5X48 (MISCELLANEOUS) ×2 IMPLANT
DEVICE PRESTO INFLATION (MISCELLANEOUS) ×2 IMPLANT
DEVICE STARCLOSE SE CLOSURE (Vascular Products) ×2 IMPLANT
GLIDEWIRE STIFF .35X180X3 HYDR (WIRE) ×2 IMPLANT
PACK ANGIOGRAPHY (CUSTOM PROCEDURE TRAY) ×2 IMPLANT
SHEATH BRITE TIP 5FRX11 (SHEATH) ×2 IMPLANT
SHEATH PINNACLE ST 6F 45CM (SHEATH) ×2 IMPLANT
SHIELD RADPAD SCOOP 12X17 (MISCELLANEOUS) ×2 IMPLANT
STENT LIFESTREAM 7X16X80 (Permanent Stent) ×2 IMPLANT
STENT LIFESTREAM 7X26X80 (Permanent Stent) ×2 IMPLANT
TOWEL OR 17X26 4PK STRL BLUE (TOWEL DISPOSABLE) ×2 IMPLANT
TUBING ASPIRATION INDIGO (MISCELLANEOUS) ×2 IMPLANT
WIRE J 3MM .035X145CM (WIRE) ×2 IMPLANT
WIRE MAGIC TOR.035 180C (WIRE) ×2 IMPLANT
WIRE MAGIC TORQUE 260C (WIRE) ×2 IMPLANT

## 2017-06-20 NOTE — Progress Notes (Signed)
Dr Lucky Cowboy called regarding pt c/o numbness in right foot toes. Foot warm. Toes a little cool but same as left foot. Dorsalis pedis pulse strong. Wiggles toes and moves foot without difficulty . Right groin site with dry intact occlusive dressing. No evidence of bleeding/hematoma. Dr Burt Knack came by to see pt shortly after return from Vascular lab.  Refer to his note. Heparin was restarted at 1350u/hr at 1800.  [place] NP aware of first lactic acid 2.9

## 2017-06-20 NOTE — Progress Notes (Signed)
ANTICOAGULATION CONSULT NOTE - Follow Up Consult  Pharmacy Consult for Heparin  Indication: mesenteric ischemia  No Known Allergies  Patient Measurements: Height: 5\' 2"  (157.5 cm) Weight: 225 lb 8.5 oz (102.3 kg) IBW/kg (Calculated) : 50.1 Heparin Dosing Weight: 74.7 kg   Vital Signs: Temp: 98.3 F (36.8 C) (08/29 0000) Temp Source: Oral (08/29 0000) BP: 122/67 (08/29 0300) Pulse Rate: 64 (08/29 0300)  Labs:  Recent Labs  06/18/17 1954 06/19/17 1314 06/19/17 2007 06/20/17 0209  HGB 14.3 14.4  --  12.3  HCT 42.7 43.5  --  36.1  PLT 294 317  --  240  APTT  --   --  103*  --   LABPROT  --   --  14.6  --   INR  --   --  1.15  --   HEPARINUNFRC  --   --   --  0.28*  CREATININE 1.36* 0.74  --  0.63  TROPONINI <0.03 <0.03  --   --     Estimated Creatinine Clearance: 87 mL/min (by C-G formula based on SCr of 0.63 mg/dL).   Medications:  Prescriptions Prior to Admission  Medication Sig Dispense Refill Last Dose  . aspirin EC 81 MG tablet Take 81 mg by mouth daily.   06/18/2017 at 1200  . atorvastatin (LIPITOR) 80 MG tablet Take 80 mg by mouth at bedtime.   06/16/2017 at pm  . gabapentin (NEURONTIN) 300 MG capsule Take 300 mg by mouth 2 (two) times daily.   06/05/2017 at unknown  . lisinopril (PRINIVIL,ZESTRIL) 40 MG tablet Take 40 mg by mouth daily.   06/18/2017 at pm  . metoprolol tartrate (LOPRESSOR) 25 MG tablet Take 25 mg by mouth 2 (two) times daily.   06/18/2017 at 1200  . omeprazole (PRILOSEC) 20 MG capsule Take 20 mg by mouth 2 (two) times daily.   06/05/2017 at unknown  . traMADol (ULTRAM) 50 MG tablet Take 50 mg by mouth every 6 (six) hours as needed for moderate pain.    PRN at PRN    Assessment: CrCl = 87 ml/min   Goal of Therapy:  Heparin level 0.3-0.7 units/ml Monitor platelets by anticoagulation protocol: Yes   Plan:  8/29:  HL @ 0200 = 0.28 Will order heparin 1100 units IV X 1 and increase drip rate to 1350 units/hr.  Will recheck HL 6 hrs after rate  change.   [name] 06/20/2017,3:58 AM

## 2017-06-20 NOTE — Progress Notes (Signed)
Initial Nutrition Assessment  DOCUMENTATION CODES:   Morbid obesity  INTERVENTION:  Will monitor for diet advancement per Surgeon/MD.  Once diet able to be advanced, recommend providing Ensure Enlive po BID, each supplement provides 350 kcal and 20 grams of protein.  NUTRITION DIAGNOSIS:   Inadequate oral intake related to poor appetite, nausea (abdominal pain) as evidenced by per patient/family report.  GOAL:   Patient will meet greater than or equal to 90% of their needs  MONITOR:   Diet advancement, Labs, Weight trends, I & O's  REASON FOR ASSESSMENT:   Malnutrition Screening Tool    ASSESSMENT:   56 year old female with PMHx of HTN, HLD, GERD, Bell's palsy who presented with one week history of abdominal pain that had progressed to severe pain over the last 2 days, also with nausea and poor appetite. 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   -Plan is for angiography today with planned intervention (likely thrombectomy, thrombolytic therapy, covered stent placement).  Spoke with patient at bedside. She reports she has had a poor appetite this past week in setting of her abdominal pain and nausea. Has not had any emesis. She reports she has not been eating at all this week due to the pain. Has not even been tolerating sips of liquids well. Prior to onset of symptoms she was eating well. Patient reports she has not had a bowel movement for one week. However, noted that it was charted her last BM was on 8/26, so unsure which is accurate.  Patient reports UBW is around 230 lbs. Patient was 229.6 lbs on 03/23/2017. She was 225 lbs on 06/13/2017. If current weight is accurate, patient has actually remained weight stable this past week without intake.  Medications reviewed and include: atorvastatin, lisinipril, Lopressor, pantoprazole, D5-LR @ 125 ml/hr (3000 ml, 150 grams dextrose, 510 kcal daily), heparin, Zosyn, potassium chloride 10 mEq 4 times today  IV.  Labs reviewed: Potassium 3.1.  Nutrition-Focused physical exam completed. Findings are mild fat depletion noted only in upper arm region, no muscle depletion, and mild edema.   Patient is at risk for acute malnutrition in setting of intake </=50% energy needs for >/=5 days. However, she does not meet criteria for malnutrition at this time as she does not have significant weight loss or wasting on exam.  Discussed with RN.  Diet Order:  Diet NPO time specified  Skin:  Reviewed, no issues  Last BM:  PTA (06/17/2017 per chart)  Height:   Ht Readings from Last 1 Encounters:  06/19/17 5\' 2"  (1.575 m)    Weight:   Wt Readings from Last 1 Encounters:  06/19/17 225 lb 8.5 oz (102.3 kg)    Ideal Body Weight:  50 kg  BMI:  Body mass index is 41.25 kg/m.  Estimated Nutritional Needs:   Kcal:  1880-2190 (MSJ x 1.2-1.4)  Protein:  92-112 grams (0.9-1.1 grams/kg)  Fluid:  1.5-1.75 L/day (30-35 ml/kg IBW)  EDUCATION NEEDS:   No education needs identified at this time  Willey Blade, MS, RD, LDN Pager: [phone] After Hours Pager: [phone]

## 2017-06-20 NOTE — Consult Note (Signed)
    Consulted for "probable Afib" secondary to palpitations. Review of her entire telemetry since ED and all available 12-lead EKGs show sinus rhythm. Discussed with RN, no Afib has been seen since she was in the ED or admitted. Call if Afib. Consider outpatient cardiac monitoring. Cardiology to sign off.

## 2017-06-20 NOTE — Progress Notes (Signed)
ANTICOAGULATION CONSULT NOTE - Follow Up Consult  Pharmacy Consult for Heparin  Indication: mesenteric ischemia  No Known Allergies  Patient Measurements: Height: 5\' 2"  (157.5 cm) Weight: 225 lb 8.5 oz (102.3 kg) IBW/kg (Calculated) : 50.1 Heparin Dosing Weight: 74.7 kg   Vital Signs: Temp: 97.6 F (36.4 C) (08/29 0400) Temp Source: Oral (08/29 0400) BP: 115/63 (08/29 1200) Pulse Rate: 71 (08/29 1100)  Labs:  Recent Labs  06/18/17 1954 06/19/17 1314 06/19/17 2007 06/20/17 0209 06/20/17 0949  HGB 14.3 14.4  --  12.3  --   HCT 42.7 43.5  --  36.1  --   PLT 294 317  --  240  --   APTT  --   --  103*  --   --   LABPROT  --   --  14.6  --   --   INR  --   --  1.15  --   --   HEPARINUNFRC  --   --   --  0.28* 0.52  CREATININE 1.36* 0.74  --  0.63  --   TROPONINI <0.03 <0.03  --   --   --     Estimated Creatinine Clearance: 87 mL/min (by C-G formula based on SCr of 0.63 mg/dL).   Medications:  Prescriptions Prior to Admission  Medication Sig Dispense Refill Last Dose  . aspirin EC 81 MG tablet Take 81 mg by mouth daily.   06/18/2017 at 1200  . atorvastatin (LIPITOR) 80 MG tablet Take 80 mg by mouth at bedtime.   06/16/2017 at pm  . gabapentin (NEURONTIN) 300 MG capsule Take 300 mg by mouth 2 (two) times daily.   06/05/2017 at unknown  . lisinopril (PRINIVIL,ZESTRIL) 40 MG tablet Take 40 mg by mouth daily.   06/18/2017 at pm  . metoprolol tartrate (LOPRESSOR) 25 MG tablet Take 25 mg by mouth 2 (two) times daily.   06/18/2017 at 1200  . omeprazole (PRILOSEC) 20 MG capsule Take 20 mg by mouth 2 (two) times daily.   06/05/2017 at unknown  . traMADol (ULTRAM) 50 MG tablet Take 50 mg by mouth every 6 (six) hours as needed for moderate pain.    PRN at PRN    Assessment: 56 y/o F admitted with ischemic bowel and hypertensive urgency. Patient has thrombectomy scheduled for later this PM.   Goal of Therapy:  Heparin level 0.3-0.7 units/ml Monitor platelets by anticoagulation  protocol: Yes   Plan:  Continue heparin infusion at current rate and recheck a HL in 6 hours and/or f/u plans for anticoagulation.    Ulice Dash D 06/20/2017,2:06 PM

## 2017-06-20 NOTE — Care Management (Signed)
RNCM met with patient because she is listed at self-pay/uninsuranced.  She states she lives with her boyfriend here in [place]. She states she goes to [facility] for care but would appreciate any medication assistance closer to her [place] home.  She states she is usually independent and able to drive. She is not on Oxygen at home.  Referral to made to [facility] and Medication management. RNCM to follow for [facility] assistance if Medication management cannot assistant.

## 2017-06-20 NOTE — Discharge Instructions (Signed)
Information on my medicine - ELIQUIS (apixaban)  This medication education was reviewed with me or my healthcare representative as part of my discharge preparation.  The pharmacist that spoke with me during my hospital stay was:  Durwin Reges, Student-PharmD  Why was Eliquis prescribed for you? Eliquis was prescribed for you to reduce the risk of forming blood clots that can cause a stroke if you have a medical condition called atrial fibrillation (a type of irregular heartbeat) OR to reduce the risk of a blood clots forming after orthopedic surgery.  What do You need to know about Eliquis ? Take your Eliquis TWICE DAILY - one tablet in the morning and one tablet in the evening with or without food.  It would be best to take the doses about the same time each day.  If you have difficulty swallowing the tablet whole please discuss with your pharmacist how to take the medication safely.  Take Eliquis exactly as prescribed by your doctor and DO NOT stop taking Eliquis without talking to the doctor who prescribed the medication.  Stopping may increase your risk of developing a new clot or stroke.  Refill your prescription before you run out.  After discharge, you should have regular check-up appointments with your healthcare provider that is prescribing your Eliquis.  In the future your dose may need to be changed if your kidney function or weight changes by a significant amount or as you get older.  What do you do if you miss a dose? If you miss a dose, take it as soon as you remember on the same day and resume taking twice daily.  Do not take more than one dose of ELIQUIS at the same time.  Important Safety Information A possible side effect of Eliquis is bleeding. You should call your healthcare provider right away if you experience any of the following: ? Bleeding from an injury or your nose that does not stop. ? Unusual colored urine (red or dark brown) or unusual colored stools  (red or black). ? Unusual bruising for unknown reasons. ? A serious fall or if you hit your head (even if there is no bleeding).  Some medicines may interact with Eliquis and might increase your risk of bleeding or clotting while on Eliquis. To help avoid this, consult your healthcare provider or pharmacist prior to using any new prescription or non-prescription medications, including herbals, vitamins, non-steroidal anti-inflammatory drugs (NSAIDs) and supplements.  This website has more information on Eliquis (apixaban): [email or web address].

## 2017-06-20 NOTE — Progress Notes (Signed)
Pharmacy Consult for Electrolyte Monitoring Indication: Hypokalemia  No Known Allergies  Patient Measurements: Height: 5\' 2"  (157.5 cm) Weight: 225 lb 8.5 oz (102.3 kg) IBW/kg (Calculated) : 50.1  Vital Signs: Temp: 97.6 F (36.4 C) (08/29 0400) Temp Source: Oral (08/29 0400) BP: 119/73 (08/29 1400) Pulse Rate: 71 (08/29 1100) Intake/Output from previous day: 08/28 0701 - 08/29 0700 In: 2149.3 [I.V.:949.3; IV Piggyback:1200] Out: 240 [Urine:240] Intake/Output from this shift: Total I/O In: 1179.6 [I.V.:729.6; IV Piggyback:450] Out: -   Labs:  Recent Labs  06/18/17 1954 06/19/17 1314 06/19/17 2007 06/20/17 0209  WBC 13.5* 17.0*  --  13.5*  HGB 14.3 14.4  --  12.3  HCT 42.7 43.5  --  36.1  PLT 294 317  --  240  APTT  --   --  103*  --   CREATININE 1.36* 0.74  --  0.63  MG  --   --  1.7  --   ALBUMIN 3.9  --   --   --   PROT 8.4*  --   --   --   AST 23  --   --   --   ALT 38  --   --   --   ALKPHOS 135*  --   --   --   BILITOT 0.6  --   --   --    Estimated Creatinine Clearance: 87 mL/min (by C-G formula based on SCr of 0.63 mg/dL).  Sodium (mmol/L)  Date Value  06/20/2017 139  02/12/2015 140   Potassium (mmol/L)  Date Value  06/20/2017 3.1 (L)  02/12/2015 2.9 (L)   Calcium (mg/dL)  Date Value  06/20/2017 8.4 (L)   Calcium, Total (mg/dL)  Date Value  02/12/2015 9.0    Assessment: Pharmacy consulted to assist in managing electrolytes in this 56 y/o F with mesenteric ischemia.   Plan:  Patient received 4 runs of IV KCl and 2 g magnesium sulfate this AM. Will recheck K and mag this PM and f/u AM labs.   Ulice Dash D 06/20/2017,2:53 PM

## 2017-06-20 NOTE — Progress Notes (Signed)
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 She has been admitted the ICU to rule out bowel ischemia. Workup has been thoroughly reviewed and I had discussed this patient both with Dr. Rosana Hoes and with the emergency room physician and now the ICU staff.  Today the patient describes ongoing abdominal pain which is no better but no worse she has some nausea but no vomiting and no fevers or chills.  Objective: Vital signs in last 24 hours: Temp:  [97.6 F (36.4 C)-98.9 F (37.2 C)] 97.6 F (36.4 C) (08/29 0400) Pulse Rate:  [64-83] 78 (08/29 0700) Resp:  [11-20] 18 (08/29 0700) BP: (96-198)/(59-106) 110/72 (08/29 0700) SpO2:  [93 %-100 %] 96 % (08/29 0700) Weight:  [225 lb 8.5 oz (102.3 kg)-227 lb (103 kg)] 225 lb 8.5 oz (102.3 kg) (08/28 2136) Last BM Date: 06/17/17  Intake/Output from previous day: 08/28 0701 - 08/29 0700 In: 2149.3 [I.V.:949.3; IV Piggyback:1200] Out: 240 [Urine:240] Intake/Output this shift: Total I/O In: 13.5 [I.V.:13.5] Out: -   Physical exam:  Appears quite uncomfortable. Vital signs are reviewed stable no sign of tachycardia or hypotension. She is afebrile.  Abdomen is obese soft moderately tender with some mild guarding but no rebound or percussion tenderness  Calves are nontender  Lab Results: CBC   Recent Labs  06/19/17 1314 06/20/17 0209  WBC 17.0* 13.5*  HGB 14.4 12.3  HCT 43.5 36.1  PLT 317 240   BMET  Recent Labs  06/19/17 1314 06/20/17 0209  NA 134* 139  K 3.3* 3.1*  CL 97* 105  CO2 24 26  GLUCOSE 140* 113*  BUN 15 11  CREATININE 0.74 0.63  CALCIUM 9.6 8.4*   PT/INR  Recent Labs  06/19/17 2007  LABPROT 14.6  INR 1.15   ABG No results for input(s): PHART, HCO3 in the last 72 hours.  Invalid input(s): PCO2, PO2  Studies/Results: Dg Chest 2 View  Result Date: 06/19/2017 CLINICAL DATA:  Left chest pain for 3 days EXAM: CHEST  2 VIEW COMPARISON:  June 18, 2017 FINDINGS: The heart size and mediastinal contours are within normal limits. There is no focal infiltrate, pulmonary edema, or pleural effusion. Degenerative joint changes of the spine are noted. Prior cholecystectomy clips are noted. IMPRESSION: No active cardiopulmonary disease. Electronically Signed   By: Abelardo Diesel M.D.   On: 06/19/2017 13:54   Dg Chest 2 View  Result Date: 06/18/2017 CLINICAL DATA:  Acute shortness of breath. EXAM: CHEST  2 VIEW COMPARISON:  03/24/2017 and prior radiographs FINDINGS: The cardiomediastinal silhouette is unremarkable. There is no evidence of focal airspace disease, pulmonary edema, suspicious pulmonary nodule/mass, pleural effusion, or pneumothorax. No acute bony abnormalities are identified. IMPRESSION: No active cardiopulmonary disease. Electronically Signed   By: Margarette Canada M.D.   On: 06/18/2017 20:38   Ct Angio Chest Aorta W And/or Wo Contrast  Result Date: 06/19/2017 CLINICAL DATA:  Pt in with co left sternal chest pain x 2 days no cardiac history. States has had some shob and nausea with pain. EXAM: CT ANGIOGRAPHY CHEST, ABDOMEN AND PELVIS TECHNIQUE: Multidetector CT imaging through the chest, abdomen and pelvis was performed using the standard protocol during bolus administration of intravenous contrast. Multiplanar reconstructed images and MIPs were obtained and reviewed to evaluate the vascular anatomy. CONTRAST:  125 mL of Isovue 370 intravenous contrast COMPARISON:  Abdomen and pelvis CT, 04/09/2015 FINDINGS: CTA CHEST FINDINGS Cardiovascular: Heart normal in size and  configuration. Mild coronary artery calcifications. Thoracic aorta is normal in caliber. There are minor aortic atherosclerotic calcifications along the arch. Great vessels are normal in caliber. No aortic dissection. Aortic arch branch vessels are widely patent. Mediastinum/Nodes: No neck base, axillary, mediastinal or hilar masses or adenopathy. Trachea is widely patent. Esophagus is  unremarkable. Normal thyroid. Lungs/Pleura: Lungs are clear. No pleural effusion or pneumothorax. Musculoskeletal: No fracture or acute finding. No osteoblastic or osteolytic lesions. Review of the MIP images confirms the above findings. CTA ABDOMEN AND PELVIS FINDINGS VASCULAR Aorta: Atherosclerotic disease is noted throughout the abdominal aorta, mostly the infrarenal portion. There is eccentric plaque with a component of thrombus along the right aspect of the infrarenal aorta extending to the bifurcation. No significant narrowing of the abdominal aorta. No aneurysm. No dissection. Celiac: Patent without evidence of aneurysm, dissection, vasculitis or significant stenosis. SMA: There is low attenuation material extending for 2.2 cm from the origin of the superior mesenteric artery distally. This causes significant narrowing of the vessel, 70-90%. This is likely thrombus. Distal to this, the vessel is widely patent as are its branch vessels. Renals: Single right and 2 left renal arteries are widely patent. IMA: Some plaque is evident at the origin of the inferior mesenteric artery with at least mild narrowing. The vessel is patent. Inflow: Mild atherosclerotic disease noted along the common iliac arteries without significant stenosis. External iliac vessels and common femoral arteries are widely patent. Veins: No obvious venous abnormality within the limitations of this arterial phase study. Review of the MIP images confirms the above findings. NON-VASCULAR Hepatobiliary: Liver is unremarkable. Gallbladder surgically absent. Common bile duct dilated 9 mm with distal tapering. Pancreas: Unremarkable. No pancreatic ductal dilatation or surrounding inflammatory changes. Spleen: Normal in size without focal abnormality. Adrenals/Urinary Tract: There is nodular thickening throughout the left adrenal gland most likely nodular hyperplasia. Multiple small adenomas are possible. Right adrenal gland is unremarkable. Kidneys  are normal in size, orientation and position. There is symmetric enhancement. Mild bilateral renal cortical thinning. No renal masses or stones. No hydronephrosis. Normal ureters. Bladder is unremarkable. Stomach/Bowel: There is a single loop of small bowel in the central to upper pelvis with wall thickening and adjacent hazy inflammation. There is no pneumatosis intestinalis. Remainder of the small bowel is normal in caliber with normal wall thickness and no other associated mesenteric inflammation. Stomach and colon are unremarkable. Appendix not visualized. Lymphatic: No adenopathy. Reproductive: Status post hysterectomy. No adnexal masses. Other: No abdominal wall hernia.  No ascites. Musculoskeletal: No fracture or acute finding. No osteoblastic or osteolytic lesions. Review of the MIP images confirms the above findings. IMPRESSION: CTA CHEST 1. No evidence of an aortic dissection. 2. No acute findings. 3. Mild coronary artery calcifications. CTA ABDOMEN AND PELVIS 1. 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 2. A short segment of small bowel shows wall thickening and adjacent inflammation. This lies in the pelvis. Given the findings in the superior mesenteric artery, focal small bowel ischemia is suspected. This could, alternatively, be inflammatory or infectious in etiology. No pneumatosis intestinalis or portal venous air. 3. No other significant vascular stenosis. 4. 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 No aneurysm or dissection. Critical Value/emergent results were called by telephone at the time of interpretation on 06/19/2017 at 6:03 pm to Dr. Carrie Mew , who verbally acknowledged these results. Electronically Signed   By: Shanon Brow  Ormond M.D.   On: 06/19/2017 18:03   Ct Angio Abd/pel W And/or Wo Contrast  Result Date: 06/19/2017 CLINICAL  DATA:  Pt in with co left sternal chest pain x 2 days no cardiac history. States has had some shob and nausea with pain. EXAM: CT ANGIOGRAPHY CHEST, ABDOMEN AND PELVIS TECHNIQUE: Multidetector CT imaging through the chest, abdomen and pelvis was performed using the standard protocol during bolus administration of intravenous contrast. Multiplanar reconstructed images and MIPs were obtained and reviewed to evaluate the vascular anatomy. CONTRAST:  125 mL of Isovue 370 intravenous contrast COMPARISON:  Abdomen and pelvis CT, 04/09/2015 FINDINGS: CTA CHEST FINDINGS Cardiovascular: Heart normal in size and configuration. Mild coronary artery calcifications. Thoracic aorta is normal in caliber. There are minor aortic atherosclerotic calcifications along the arch. Great vessels are normal in caliber. No aortic dissection. Aortic arch branch vessels are widely patent. Mediastinum/Nodes: No neck base, axillary, mediastinal or hilar masses or adenopathy. Trachea is widely patent. Esophagus is unremarkable. Normal thyroid. Lungs/Pleura: Lungs are clear. No pleural effusion or pneumothorax. Musculoskeletal: No fracture or acute finding. No osteoblastic or osteolytic lesions. Review of the MIP images confirms the above findings. CTA ABDOMEN AND PELVIS FINDINGS VASCULAR Aorta: Atherosclerotic disease is noted throughout the abdominal aorta, mostly the infrarenal portion. There is eccentric plaque with a component of thrombus along the right aspect of the infrarenal aorta extending to the bifurcation. No significant narrowing of the abdominal aorta. No aneurysm. No dissection. Celiac: Patent without evidence of aneurysm, dissection, vasculitis or significant stenosis. SMA: There is low attenuation material extending for 2.2 cm from the origin of the superior mesenteric artery distally. This causes significant narrowing of the vessel, 70-90%. This is likely thrombus. Distal to this, the vessel is widely patent as are its branch  vessels. Renals: Single right and 2 left renal arteries are widely patent. IMA: Some plaque is evident at the origin of the inferior mesenteric artery with at least mild narrowing. The vessel is patent. Inflow: Mild atherosclerotic disease noted along the common iliac arteries without significant stenosis. External iliac vessels and common femoral arteries are widely patent. Veins: No obvious venous abnormality within the limitations of this arterial phase study. Review of the MIP images confirms the above findings. NON-VASCULAR Hepatobiliary: Liver is unremarkable. Gallbladder surgically absent. Common bile duct dilated 9 mm with distal tapering. Pancreas: Unremarkable. No pancreatic ductal dilatation or surrounding inflammatory changes. Spleen: Normal in size without focal abnormality. Adrenals/Urinary Tract: There is nodular thickening throughout the left adrenal gland most likely nodular hyperplasia. Multiple small adenomas are possible. Right adrenal gland is unremarkable. Kidneys are normal in size, orientation and position. There is symmetric enhancement. Mild bilateral renal cortical thinning. No renal masses or stones. No hydronephrosis. Normal ureters. Bladder is unremarkable. Stomach/Bowel: There is a single loop of small bowel in the central to upper pelvis with wall thickening and adjacent hazy inflammation. There is no pneumatosis intestinalis. Remainder of the small bowel is normal in caliber with normal wall thickness and no other associated mesenteric inflammation. Stomach and colon are unremarkable. Appendix not visualized. Lymphatic: No adenopathy. Reproductive: Status post hysterectomy. No adnexal masses. Other: No abdominal wall hernia.  No ascites. Musculoskeletal: No fracture or acute finding. No osteoblastic or osteolytic lesions. Review of the MIP images confirms the above findings. IMPRESSION: CTA CHEST 1. No evidence of an aortic dissection. 2. No acute findings. 3. Mild coronary artery  calcifications. CTA ABDOMEN AND PELVIS 1. There is an apparent thrombus or embolus in the  proximal superior mesenteric artery causing severe, 70-90%, narrowing. 2. A short segment of small bowel shows wall thickening and adjacent inflammation. This lies in the pelvis. Given the findings in the superior mesenteric artery, focal small bowel ischemia is suspected. This could, alternatively, be inflammatory or infectious in etiology. No pneumatosis intestinalis or portal venous air. 3. No other significant vascular stenosis. 4. 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 No aneurysm or dissection. Critical Value/emergent results were called by telephone at the time of interpretation on 06/19/2017 at 6:03 pm to Dr. Carrie Mew , who verbally acknowledged these results. Electronically Signed   By: Lajean Manes M.D.   On: 06/19/2017 18:03    Anti-infectives: Anti-infectives    Start     Dose/Rate Route Frequency Ordered Stop   06/20/17 0230  piperacillin-tazobactam (ZOSYN) IVPB 3.375 g     3.375 g 12.5 mL/hr over 240 Minutes Intravenous Every 8 hours 06/20/17 0143     06/20/17 0000  piperacillin-tazobactam (ZOSYN) IVPB 3.375 g  Status:  Discontinued     3.375 g 12.5 mL/hr over 240 Minutes Intravenous Every 8 hours 06/19/17 2000 06/20/17 0143   06/19/17 2015  piperacillin-tazobactam (ZOSYN) IVPB 3.375 g     3.375 g 100 mL/hr over 30 Minutes Intravenous  Once 06/19/17 2000 06/19/17 2301   06/19/17 1930  piperacillin-tazobactam (ZOSYN) IVPB 3.375 g  Status:  Discontinued     3.375 g 12.5 mL/hr over 240 Minutes Intravenous Every 6 hours 06/19/17 1924 06/19/17 2000      Assessment/Plan:  This a patient with relative bowel ischemia and 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 The etiology the thrombus is unclear and she is undergoing a workup for possible atrial fibrillation with an echocardiogram.  Currently it appears that her bowel  ischemia is probably reversible and she shows no sign of absolute necrosis at this time. I base this on her bicarbonate being stable and her lactate being normal in the face of normal vital signs. She warrants continued ICU stay and reexamination as well as anticoagulation. Dr. Rosana Hoes had spoken with Dr. dew last night and she may benefit from vascular intervention based on Dr. Bunnie Domino examination and experience. I will defer to him concerning further vascular evaluation.  Florene Glen, MD, FACS  06/20/2017

## 2017-06-20 NOTE — Progress Notes (Signed)
Findings and performance of the angiography etc. discussed with Dr. dew.  Patient visited in the ICU. Her chief complaint is "I'm cold" when asked about her abdomen she states it's about the same. No nausea or vomiting.  Vital signs reviewed heart rate is in the 80s abdomen is soft nondistended nontympanitic diffusely tender without peritoneal signs.  No new labs this afternoon.  The concern in this patient is that she likely has thrombus starting in the heart has a byproduct of atrial fibrillation however she's been in sinus rhythm. The etiology remains unclear. However the biggest concern here is that she could have necrosis of her small intestine due to the occlusion of the SMA in spite of revascularization. Her labs and vital signs did not suggest that at this time. However this may require exploration if after 24-48 hours she remains with considerable pain and tenderness. Labs will be repeated in the morning and vital signs will be reviewed.

## 2017-06-20 NOTE — Progress Notes (Signed)
Report given to Erin, RN

## 2017-06-20 NOTE — Consult Note (Signed)
[facility] Vascular Consult Note  MRN : [id number]  Carla [name] is a 56 y.o. (1960-11-26) female who presents with chief complaint of  Chief Complaint  Patient presents with  . Chest Pain  .  History of Present Illness: I am asked by Dr. Rosana Hoes to see the patient regarding SMA thrombosis.  The patient presents with about a 2 day history of severe abdominal pain. She has been having some degree of abdominal pain for about a week now. She also had some palpitations over the past few weeks that she had noticed. Over the past 24-48 hours her pain is become much more intense. She is nauseated and has a poor appetite. She had no previous episodes similar to this. She denies antecedent food fear, weight loss, or abdominal pain prior to this recent episode. She is also having some pain in her hips and thighs. As part of her workup she had a CT scan which I have independently reviewed. She does not have any thoracic aortic pathology and has minimal atherosclerosis throughout the chest abdomen and pelvis. There is a nearly occlusive thrombus at the origin of the SMA tracking several centimeters distally. This is almost certainly embolic in nature as there is not associated native SMA disease. The celiac artery is patent. The distal aorta has thrombus/embolus as well tracking into the proximal iliac arteries. This does not appear high-grade at its occlusive nature, but is at least mild to moderate.  Current Facility-Administered Medications  Medication Dose Route Frequency Provider Last Rate Last Dose  . atorvastatin (LIPITOR) tablet 80 mg  80 mg Oral QHS Varughese, Bincy S, NP      . dextrose 5 % in lactated ringers infusion   Intravenous Continuous Vickie Epley, MD 125 mL/hr at 06/20/17 0500    . heparin ADULT infusion 100 units/mL (25000 units/281mL sodium chloride 0.45%)  1,350 Units/hr Intravenous Continuous Vickie Epley, MD 13.5 mL/hr at 06/20/17 0500 1,350 Units/hr  at 06/20/17 0500  . labetalol (NORMODYNE,TRANDATE) injection 10 mg  10 mg Intravenous Q2H PRN Gouru, Aruna, MD   10 mg at 06/19/17 2016  . lisinopril (PRINIVIL,ZESTRIL) tablet 40 mg  40 mg Oral Daily Varughese, Bincy S, NP   40 mg at 06/20/17 0903  . metoprolol tartrate (LOPRESSOR) tablet 25 mg  25 mg Oral BID Varughese, Bincy S, NP   25 mg at 06/20/17 0903  . morphine 2 MG/ML injection 2 mg  2 mg Intravenous Q1H PRN Flora Lipps, MD      . ondansetron (ZOFRAN-ODT) disintegrating tablet 4 mg  4 mg Oral Q6H PRN Vickie Epley, MD       Or  . ondansetron [facility]) injection 4 mg  4 mg Intravenous Q6H PRN Vickie Epley, MD      . pantoprazole (PROTONIX) injection 40 mg  40 mg Intravenous Q24H Gouru, Illene Silver, MD   40 mg at 06/19/17 2217  . piperacillin-tazobactam (ZOSYN) IVPB 3.375 g  3.375 g Intravenous Q8H Vickie Epley, MD   Stopped at 06/20/17 [id number]  . potassium chloride 10 mEq in 100 mL IVPB  10 mEq Intravenous Q1 Hr x 4 Napoleon Form, RPH 100 mL/hr at 06/20/17 1017 10 mEq at 06/20/17 1017    Past Medical History:  Diagnosis Date  . Bell's palsy   . GERD (gastroesophageal reflux disease)   . Hyperlipidemia   . Hypertension   . Morbid obesity with BMI of 40.0-44.9, adult (HCC)     Past  Surgical History:  Procedure Laterality Date  . CHOLECYSTECTOMY    . VAGINAL HYSTERECTOMY      Social History Social History  Substance Use Topics  . Smoking status: Current Some Day Smoker    Packs/day: 0.50    Types: Cigarettes  . Smokeless tobacco: Never Used  . Alcohol use No  No IVDU  Family History Family History  Problem Relation Age of Onset  . Hypertension Mother   . Heart disease Mother   . Heart attack Mother   . Breast cancer Mother   . Hypertension Father   . Breast cancer Paternal Aunt     No Known Allergies   REVIEW OF SYSTEMS (Negative unless checked)  Constitutional: [] Weight loss  [] Fever  [] Chills Cardiac: [] Chest pain   [] Chest pressure    [x] Palpitations   [] Shortness of breath when laying flat   [] Shortness of breath at rest   [] Shortness of breath with exertion. Vascular:  [] Pain in legs with walking   [] Pain in legs at rest   [] Pain in legs when laying flat   [] Claudication   [] Pain in feet when walking  [] Pain in feet at rest  [] Pain in feet when laying flat   [] History of DVT   [] Phlebitis   [] Swelling in legs   [] Varicose veins   [] Non-healing ulcers Pulmonary:   [] Uses home oxygen   [] Productive cough   [] Hemoptysis   [] Wheeze  [] COPD   [] Asthma Neurologic:  [] Dizziness  [] Blackouts   [] Seizures   [] History of stroke   [] History of TIA  [] Aphasia   [] Temporary blindness   [] Dysphagia   [] Weakness or numbness in arms   [] Weakness or numbness in legs Musculoskeletal:  [] Arthritis   [] Joint swelling   [] Joint pain   [] Low back pain Hematologic:  [] Easy bruising  [] Easy bleeding   [] Hypercoagulable state   [] Anemic  [] Hepatitis Gastrointestinal:  [] Blood in stool   [] Vomiting blood  [x] Gastroesophageal reflux/heartburn   [] Difficulty swallowing. X positive for abdominal pain Genitourinary:  [] Chronic kidney disease   [] Difficult urination  [] Frequent urination  [] Burning with urination   [] Blood in urine Skin:  [] Rashes   [] Ulcers   [] Wounds Psychological:  [x] History of anxiety   []  History of major depression.  Physical Examination  Vitals:   06/20/17 0700 06/20/17 0800 06/20/17 0900 06/20/17 1000  BP: 110/72 116/65 130/71 111/66  Pulse: 78 84 77 86  Resp: 18 15 15 14   Temp:      TempSrc:      SpO2: 96% 97% 98% 97%  Weight:      Height:       Body mass index is 41.25 kg/m. Gen:  WD/WN, obese Head: [place]/AT, No temporalis wasting.  Ear/Nose/Throat: Hearing grossly intact, nares w/o erythema or drainage, oropharynx w/o Erythema/Exudate Eyes: Sclera non-icteric, conjunctiva clear Neck: Trachea midline.  No JVD.  Pulmonary:  Good air movement, respirations not labored, equal bilaterally.  Cardiac: RRR, normal S1,  S2. Vascular:  Vessel Right Left  Radial Palpable Palpable                          PT 1+ Palpable 1+ Palpable  DP 1+ Palpable 1+ Palpable   Gastrointestinal: soft, tender throughout with some guarding. Musculoskeletal: M/S 5/5 throughout.  Extremities without ischemic changes.  No deformity or atrophy. No edema. Neurologic: Sensation grossly intact in extremities.  Symmetrical.  Speech is fluent. Motor exam as listed above. Psychiatric: Judgment intact, Mood & affect appropriate  for pt's clinical situation which is somewhat anxious Dermatologic: No rashes or ulcers noted.  No cellulitis or open wounds.       CBC Lab Results  Component Value Date   WBC 13.5 (H) 06/20/2017   HGB 12.3 06/20/2017   HCT 36.1 06/20/2017   MCV 88.5 06/20/2017   PLT 240 06/20/2017    BMET    Component Value Date/Time   NA 139 06/20/2017 0209   NA 140 02/12/2015 1137   K 3.1 (L) 06/20/2017 0209   K 2.9 (L) 02/12/2015 1137   CL 105 06/20/2017 0209   CL 107 02/12/2015 1137   CO2 26 06/20/2017 0209   CO2 27 02/12/2015 1137   GLUCOSE 113 (H) 06/20/2017 0209   GLUCOSE 120 (H) 02/12/2015 1137   BUN 11 06/20/2017 0209   BUN 15 02/12/2015 1137   CREATININE 0.63 06/20/2017 0209   CREATININE 0.78 02/12/2015 1137   CALCIUM 8.4 (L) 06/20/2017 0209   CALCIUM 9.0 02/12/2015 1137   GFRNONAA >60 06/20/2017 0209   GFRNONAA >60 02/12/2015 1137   GFRAA >60 06/20/2017 0209   GFRAA >60 02/12/2015 1137   Estimated Creatinine Clearance: 87 mL/min (by C-G formula based on SCr of 0.63 mg/dL).  COAG Lab Results  Component Value Date   INR 1.15 06/19/2017   INR 0.88 07/23/2016   INR 0.8 07/23/2012    Radiology Dg Chest 2 View  Result Date: 06/19/2017 CLINICAL DATA:  Left chest pain for 3 days EXAM: CHEST  2 VIEW COMPARISON:  June 18, 2017 FINDINGS: The heart size and mediastinal contours are within normal limits. There is no focal infiltrate, pulmonary edema, or pleural effusion. Degenerative  joint changes of the spine are noted. Prior cholecystectomy clips are noted. IMPRESSION: No active cardiopulmonary disease. Electronically Signed   By: Abelardo Diesel M.D.   On: 06/19/2017 13:54   Dg Chest 2 View  Result Date: 06/18/2017 CLINICAL DATA:  Acute shortness of breath. EXAM: CHEST  2 VIEW COMPARISON:  03/24/2017 and prior radiographs FINDINGS: The cardiomediastinal silhouette is unremarkable. There is no evidence of focal airspace disease, pulmonary edema, suspicious pulmonary nodule/mass, pleural effusion, or pneumothorax. No acute bony abnormalities are identified. IMPRESSION: No active cardiopulmonary disease. Electronically Signed   By: Margarette Canada M.D.   On: 06/18/2017 20:38   Ct Angio Chest Aorta W And/or Wo Contrast  Result Date: 06/19/2017 CLINICAL DATA:  Pt in with co left sternal chest pain x 2 days no cardiac history. States has had some shob and nausea with pain. EXAM: CT ANGIOGRAPHY CHEST, ABDOMEN AND PELVIS TECHNIQUE: Multidetector CT imaging through the chest, abdomen and pelvis was performed using the standard protocol during bolus administration of intravenous contrast. Multiplanar reconstructed images and MIPs were obtained and reviewed to evaluate the vascular anatomy. CONTRAST:  125 mL of Isovue 370 intravenous contrast COMPARISON:  Abdomen and pelvis CT, 04/09/2015 FINDINGS: CTA CHEST FINDINGS Cardiovascular: Heart normal in size and configuration. Mild coronary artery calcifications. Thoracic aorta is normal in caliber. There are minor aortic atherosclerotic calcifications along the arch. Great vessels are normal in caliber. No aortic dissection. Aortic arch branch vessels are widely patent. Mediastinum/Nodes: No neck base, axillary, mediastinal or hilar masses or adenopathy. Trachea is widely patent. Esophagus is unremarkable. Normal thyroid. Lungs/Pleura: Lungs are clear. No pleural effusion or pneumothorax. Musculoskeletal: No fracture or acute finding. No osteoblastic or  osteolytic lesions. Review of the MIP images confirms the above findings. CTA ABDOMEN AND PELVIS FINDINGS VASCULAR Aorta: Atherosclerotic disease is noted  throughout the abdominal aorta, mostly the infrarenal portion. There is eccentric plaque with a component of thrombus along the right aspect of the infrarenal aorta extending to the bifurcation. No significant narrowing of the abdominal aorta. No aneurysm. No dissection. Celiac: Patent without evidence of aneurysm, dissection, vasculitis or significant stenosis. SMA: There is low attenuation material extending for 2.2 cm from the origin of the superior mesenteric artery distally. This causes significant narrowing of the vessel, 70-90%. This is likely thrombus. Distal to this, the vessel is widely patent as are its branch vessels. Renals: Single right and 2 left renal arteries are widely patent. IMA: Some plaque is evident at the origin of the inferior mesenteric artery with at least mild narrowing. The vessel is patent. Inflow: Mild atherosclerotic disease noted along the common iliac arteries without significant stenosis. External iliac vessels and common femoral arteries are widely patent. Veins: No obvious venous abnormality within the limitations of this arterial phase study. Review of the MIP images confirms the above findings. NON-VASCULAR Hepatobiliary: Liver is unremarkable. Gallbladder surgically absent. Common bile duct dilated 9 mm with distal tapering. Pancreas: Unremarkable. No pancreatic ductal dilatation or surrounding inflammatory changes. Spleen: Normal in size without focal abnormality. Adrenals/Urinary Tract: There is nodular thickening throughout the left adrenal gland most likely nodular hyperplasia. Multiple small adenomas are possible. Right adrenal gland is unremarkable. Kidneys are normal in size, orientation and position. There is symmetric enhancement. Mild bilateral renal cortical thinning. No renal masses or stones. No hydronephrosis.  Normal ureters. Bladder is unremarkable. Stomach/Bowel: There is a single loop of small bowel in the central to upper pelvis with wall thickening and adjacent hazy inflammation. There is no pneumatosis intestinalis. Remainder of the small bowel is normal in caliber with normal wall thickness and no other associated mesenteric inflammation. Stomach and colon are unremarkable. Appendix not visualized. Lymphatic: No adenopathy. Reproductive: Status post hysterectomy. No adnexal masses. Other: No abdominal wall hernia.  No ascites. Musculoskeletal: No fracture or acute finding. No osteoblastic or osteolytic lesions. Review of the MIP images confirms the above findings. IMPRESSION: CTA CHEST 1. No evidence of an aortic dissection. 2. No acute findings. 3. Mild coronary artery calcifications. CTA ABDOMEN AND PELVIS 1. 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 2. A short segment of small bowel shows wall thickening and adjacent inflammation. This lies in the pelvis. Given the findings in the superior mesenteric artery, focal small bowel ischemia is suspected. This could, alternatively, be inflammatory or infectious in etiology. No pneumatosis intestinalis or portal venous air. 3. No other significant vascular stenosis. 4. 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 No aneurysm or dissection. Critical Value/emergent results were called by telephone at the time of interpretation on 06/19/2017 at 6:03 pm to Dr. Carrie Mew , who verbally acknowledged these results. Electronically Signed   By: Lajean Manes M.D.   On: 06/19/2017 18:03   Ct Angio Abd/pel W And/or Wo Contrast  Result Date: 06/19/2017 CLINICAL DATA:  Pt in with co left sternal chest pain x 2 days no cardiac history. States has had some shob and nausea with pain. EXAM: CT ANGIOGRAPHY CHEST, ABDOMEN AND PELVIS  TECHNIQUE: Multidetector CT imaging through the chest, abdomen and pelvis was performed using the standard protocol during bolus administration of intravenous contrast. Multiplanar reconstructed images and MIPs were obtained and reviewed to evaluate the vascular anatomy. CONTRAST:  125 mL of Isovue 370  intravenous contrast COMPARISON:  Abdomen and pelvis CT, 04/09/2015 FINDINGS: CTA CHEST FINDINGS Cardiovascular: Heart normal in size and configuration. Mild coronary artery calcifications. Thoracic aorta is normal in caliber. There are minor aortic atherosclerotic calcifications along the arch. Great vessels are normal in caliber. No aortic dissection. Aortic arch branch vessels are widely patent. Mediastinum/Nodes: No neck base, axillary, mediastinal or hilar masses or adenopathy. Trachea is widely patent. Esophagus is unremarkable. Normal thyroid. Lungs/Pleura: Lungs are clear. No pleural effusion or pneumothorax. Musculoskeletal: No fracture or acute finding. No osteoblastic or osteolytic lesions. Review of the MIP images confirms the above findings. CTA ABDOMEN AND PELVIS FINDINGS VASCULAR Aorta: Atherosclerotic disease is noted throughout the abdominal aorta, mostly the infrarenal portion. There is eccentric plaque with a component of thrombus along the right aspect of the infrarenal aorta extending to the bifurcation. No significant narrowing of the abdominal aorta. No aneurysm. No dissection. Celiac: Patent without evidence of aneurysm, dissection, vasculitis or significant stenosis. SMA: There is low attenuation material extending for 2.2 cm from the origin of the superior mesenteric artery distally. This causes significant narrowing of the vessel, 70-90%. This is likely thrombus. Distal to this, the vessel is widely patent as are its branch vessels. Renals: Single right and 2 left renal arteries are widely patent. IMA: Some plaque is evident at the origin of the inferior mesenteric artery with at least mild  narrowing. The vessel is patent. Inflow: Mild atherosclerotic disease noted along the common iliac arteries without significant stenosis. External iliac vessels and common femoral arteries are widely patent. Veins: No obvious venous abnormality within the limitations of this arterial phase study. Review of the MIP images confirms the above findings. NON-VASCULAR Hepatobiliary: Liver is unremarkable. Gallbladder surgically absent. Common bile duct dilated 9 mm with distal tapering. Pancreas: Unremarkable. No pancreatic ductal dilatation or surrounding inflammatory changes. Spleen: Normal in size without focal abnormality. Adrenals/Urinary Tract: There is nodular thickening throughout the left adrenal gland most likely nodular hyperplasia. Multiple small adenomas are possible. Right adrenal gland is unremarkable. Kidneys are normal in size, orientation and position. There is symmetric enhancement. Mild bilateral renal cortical thinning. No renal masses or stones. No hydronephrosis. Normal ureters. Bladder is unremarkable. Stomach/Bowel: There is a single loop of small bowel in the central to upper pelvis with wall thickening and adjacent hazy inflammation. There is no pneumatosis intestinalis. Remainder of the small bowel is normal in caliber with normal wall thickness and no other associated mesenteric inflammation. Stomach and colon are unremarkable. Appendix not visualized. Lymphatic: No adenopathy. Reproductive: Status post hysterectomy. No adnexal masses. Other: No abdominal wall hernia.  No ascites. Musculoskeletal: No fracture or acute finding. No osteoblastic or osteolytic lesions. Review of the MIP images confirms the above findings. IMPRESSION: CTA CHEST 1. No evidence of an aortic dissection. 2. No acute findings. 3. Mild coronary artery calcifications. CTA ABDOMEN AND PELVIS 1. 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 2. A short  segment of small bowel shows wall thickening and adjacent inflammation. This lies in the pelvis. Given the findings in the superior mesenteric artery, focal small bowel ischemia is suspected. This could, alternatively, be inflammatory or infectious in etiology. No pneumatosis intestinalis or portal venous air. 3. No other significant vascular stenosis. 4. 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 No aneurysm or dissection. Critical Value/emergent results were called by telephone at the time of interpretation on 06/19/2017 at  6:03 pm to Dr. Carrie Mew , who verbally acknowledged these results. Electronically Signed   By: Lajean Manes M.D.   On: 06/19/2017 18:03      Assessment/Plan 1. 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  This is a critical and life threatening situation. The patient has stabilized and is doing okay on heparin, but this is likely not a great long-term option. I would recommend angiography with planned intervention. This would likely include thrombectomy, thrombolytic therapy, and a covered stent placement. I discussed the grave nature of the situation with the patient. I discussed this would be less invasive than open surgical therapy, that would also be an option. She prefers to proceed with angiography and I will schedule that for later today. The source of this thrombus is almost certainly cardiac in origin. She has almost no atherosclerosis present. She also has distal aortic thrombus which are become consistent with an embolus as well. She did not have antecedent chronic visceral ischemic symptoms so this is not an acute on chronic situation. 2. 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 Would likely perform some degree of thrombectomy and lytic therapy on this as well. Discussed with the patient that this would be on the way to the SMA which is the primary target, the  treatment of this may be necessary as well.  3. HTN. Stable on outpatient medications and blood pressure control important in reducing the progression of atherosclerotic disease. On appropriate oral medications. 4. Hyperlipidemia. lipid control important in reducing the progression of atherosclerotic disease. Continue statin therapy 5. Tobacco use.  Increases atherosclerotic risk as well as thrombotic risk. Smoking cessation of benefit.   [name], MD  06/20/2017 10:52 AM    This note was created with Dragon medical transcription system.  Any error is purely unintentional

## 2017-06-20 NOTE — Progress Notes (Signed)
Patient transferred by bed to Special [facility].  Alert with no distress noted.

## 2017-06-20 NOTE — Progress Notes (Addendum)
[facility] NAME: Carla [name]    MR#:  [id number]  DATE OF BIRTH:  1961-05-20  SUBJECTIVE:  CHIEF COMPLAINT:   Chief Complaint  Patient presents with  . Chest Pain   Still abdominal pain and nausea, on heparin drip. REVIEW OF SYSTEMS:  Review of Systems  Constitutional: Negative for chills, fever and malaise/fatigue.  HENT: Negative for sore throat.   Eyes: Negative for blurred vision and double vision.  Respiratory: Negative for cough, hemoptysis, shortness of breath, wheezing and stridor.   Cardiovascular: Negative for chest pain, palpitations, orthopnea and leg swelling.  Gastrointestinal: Positive for nausea and vomiting. Negative for abdominal pain, blood in stool, diarrhea and melena.  Genitourinary: Negative for dysuria, flank pain and hematuria.  Musculoskeletal: Negative for back pain and joint pain.  Skin: Negative for rash.  Neurological: Negative for dizziness, sensory change, focal weakness, seizures, loss of consciousness, weakness and headaches.  Endo/Heme/Allergies: Negative for polydipsia.  Psychiatric/Behavioral: Negative for depression. The patient is not nervous/anxious.     DRUG ALLERGIES:  No Known Allergies VITALS:  Blood pressure 112/79, pulse 82, temperature 99 F (37.2 C), resp. rate 16, height 5\' 2"  (1.575 m), weight 225 lb (102.1 kg), SpO2 100 %. PHYSICAL EXAMINATION:  Physical Exam  Constitutional: She is oriented to person, place, and time and well-developed, well-nourished, and in no distress.  obese  HENT:  Head: Normocephalic.  Mouth/Throat: Oropharynx is clear and moist.  Eyes: Pupils are equal, round, and reactive to light. Conjunctivae and EOM are normal. No scleral icterus.  Neck: Normal range of motion. Neck supple. No JVD present. No tracheal deviation present.  Cardiovascular: Normal rate, regular rhythm and normal heart sounds.  Exam reveals no gallop.   No murmur  heard. Pulmonary/Chest: Effort normal and breath sounds normal. No respiratory distress. She has no wheezes. She has no rales.  Abdominal: Soft. Bowel sounds are normal. She exhibits no distension. There is tenderness. There is no rebound.  Diffuse tenderness  Musculoskeletal: Normal range of motion. She exhibits no edema or tenderness.  Neurological: She is alert and oriented to person, place, and time. No cranial nerve deficit.  Skin: No rash noted. No erythema.  Psychiatric: Affect normal.   LABORATORY PANEL:  Female CBC  Recent Labs Lab 06/20/17 0209  WBC 13.5*  HGB 12.3  HCT 36.1  PLT 240   ------------------------------------------------------------------------------------------------------------------ Chemistries   Recent Labs Lab 06/18/17 1954  06/19/17 2007 06/20/17 0209  NA 136  < >  --  139  K 3.4*  < >  --  3.1*  CL 98*  < >  --  105  CO2 26  < >  --  26  GLUCOSE 167*  < >  --  113*  BUN 16  < >  --  11  CREATININE 1.36*  < >  --  0.63  CALCIUM 9.6  < >  --  8.4*  MG  --   --  1.7  --   AST 23  --   --   --   ALT 38  --   --   --   ALKPHOS 135*  --   --   --   BILITOT 0.6  --   --   --   < > = values in this interval not displayed. RADIOLOGY:  Ct Angio Chest Aorta W And/or Wo Contrast  Result Date: 06/19/2017 CLINICAL DATA:  Pt in with co left sternal  chest pain x 2 days no cardiac history. States has had some shob and nausea with pain. EXAM: CT ANGIOGRAPHY CHEST, ABDOMEN AND PELVIS TECHNIQUE: Multidetector CT imaging through the chest, abdomen and pelvis was performed using the standard protocol during bolus administration of intravenous contrast. Multiplanar reconstructed images and MIPs were obtained and reviewed to evaluate the vascular anatomy. CONTRAST:  125 mL of Isovue 370 intravenous contrast COMPARISON:  Abdomen and pelvis CT, 04/09/2015 FINDINGS: CTA CHEST FINDINGS Cardiovascular: Heart normal in size and configuration. Mild coronary artery  calcifications. Thoracic aorta is normal in caliber. There are minor aortic atherosclerotic calcifications along the arch. Great vessels are normal in caliber. No aortic dissection. Aortic arch branch vessels are widely patent. Mediastinum/Nodes: No neck base, axillary, mediastinal or hilar masses or adenopathy. Trachea is widely patent. Esophagus is unremarkable. Normal thyroid. Lungs/Pleura: Lungs are clear. No pleural effusion or pneumothorax. Musculoskeletal: No fracture or acute finding. No osteoblastic or osteolytic lesions. Review of the MIP images confirms the above findings. CTA ABDOMEN AND PELVIS FINDINGS VASCULAR Aorta: Atherosclerotic disease is noted throughout the abdominal aorta, mostly the infrarenal portion. There is eccentric plaque with a component of thrombus along the right aspect of the infrarenal aorta extending to the bifurcation. No significant narrowing of the abdominal aorta. No aneurysm. No dissection. Celiac: Patent without evidence of aneurysm, dissection, vasculitis or significant stenosis. SMA: There is low attenuation material extending for 2.2 cm from the origin of the superior mesenteric artery distally. This causes significant narrowing of the vessel, 70-90%. This is likely thrombus. Distal to this, the vessel is widely patent as are its branch vessels. Renals: Single right and 2 left renal arteries are widely patent. IMA: Some plaque is evident at the origin of the inferior mesenteric artery with at least mild narrowing. The vessel is patent. Inflow: Mild atherosclerotic disease noted along the common iliac arteries without significant stenosis. External iliac vessels and common femoral arteries are widely patent. Veins: No obvious venous abnormality within the limitations of this arterial phase study. Review of the MIP images confirms the above findings. NON-VASCULAR Hepatobiliary: Liver is unremarkable. Gallbladder surgically absent. Common bile duct dilated 9 mm with distal  tapering. Pancreas: Unremarkable. No pancreatic ductal dilatation or surrounding inflammatory changes. Spleen: Normal in size without focal abnormality. Adrenals/Urinary Tract: There is nodular thickening throughout the left adrenal gland most likely nodular hyperplasia. Multiple small adenomas are possible. Right adrenal gland is unremarkable. Kidneys are normal in size, orientation and position. There is symmetric enhancement. Mild bilateral renal cortical thinning. No renal masses or stones. No hydronephrosis. Normal ureters. Bladder is unremarkable. Stomach/Bowel: There is a single loop of small bowel in the central to upper pelvis with wall thickening and adjacent hazy inflammation. There is no pneumatosis intestinalis. Remainder of the small bowel is normal in caliber with normal wall thickness and no other associated mesenteric inflammation. Stomach and colon are unremarkable. Appendix not visualized. Lymphatic: No adenopathy. Reproductive: Status post hysterectomy. No adnexal masses. Other: No abdominal wall hernia.  No ascites. Musculoskeletal: No fracture or acute finding. No osteoblastic or osteolytic lesions. Review of the MIP images confirms the above findings. IMPRESSION: CTA CHEST 1. No evidence of an aortic dissection. 2. No acute findings. 3. Mild coronary artery calcifications. CTA ABDOMEN AND PELVIS 1. 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 2. A short segment of small bowel shows wall thickening and adjacent inflammation. This lies in the pelvis. Given the findings in  the superior mesenteric artery, focal small bowel ischemia is suspected. This could, alternatively, be inflammatory or infectious in etiology. No pneumatosis intestinalis or portal venous air. 3. No other significant vascular stenosis. 4. 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 No  aneurysm or dissection. Critical Value/emergent results were called by telephone at the time of interpretation on 06/19/2017 at 6:03 pm to Dr. Carrie Mew , who verbally acknowledged these results. Electronically Signed   By: Lajean Manes M.D.   On: 06/19/2017 18:03   Ct Angio Abd/pel W And/or Wo Contrast  Result Date: 06/19/2017 CLINICAL DATA:  Pt in with co left sternal chest pain x 2 days no cardiac history. States has had some shob and nausea with pain. EXAM: CT ANGIOGRAPHY CHEST, ABDOMEN AND PELVIS TECHNIQUE: Multidetector CT imaging through the chest, abdomen and pelvis was performed using the standard protocol during bolus administration of intravenous contrast. Multiplanar reconstructed images and MIPs were obtained and reviewed to evaluate the vascular anatomy. CONTRAST:  125 mL of Isovue 370 intravenous contrast COMPARISON:  Abdomen and pelvis CT, 04/09/2015 FINDINGS: CTA CHEST FINDINGS Cardiovascular: Heart normal in size and configuration. Mild coronary artery calcifications. Thoracic aorta is normal in caliber. There are minor aortic atherosclerotic calcifications along the arch. Great vessels are normal in caliber. No aortic dissection. Aortic arch branch vessels are widely patent. Mediastinum/Nodes: No neck base, axillary, mediastinal or hilar masses or adenopathy. Trachea is widely patent. Esophagus is unremarkable. Normal thyroid. Lungs/Pleura: Lungs are clear. No pleural effusion or pneumothorax. Musculoskeletal: No fracture or acute finding. No osteoblastic or osteolytic lesions. Review of the MIP images confirms the above findings. CTA ABDOMEN AND PELVIS FINDINGS VASCULAR Aorta: Atherosclerotic disease is noted throughout the abdominal aorta, mostly the infrarenal portion. There is eccentric plaque with a component of thrombus along the right aspect of the infrarenal aorta extending to the bifurcation. No significant narrowing of the abdominal aorta. No aneurysm. No dissection. Celiac:  Patent without evidence of aneurysm, dissection, vasculitis or significant stenosis. SMA: There is low attenuation material extending for 2.2 cm from the origin of the superior mesenteric artery distally. This causes significant narrowing of the vessel, 70-90%. This is likely thrombus. Distal to this, the vessel is widely patent as are its branch vessels. Renals: Single right and 2 left renal arteries are widely patent. IMA: Some plaque is evident at the origin of the inferior mesenteric artery with at least mild narrowing. The vessel is patent. Inflow: Mild atherosclerotic disease noted along the common iliac arteries without significant stenosis. External iliac vessels and common femoral arteries are widely patent. Veins: No obvious venous abnormality within the limitations of this arterial phase study. Review of the MIP images confirms the above findings. NON-VASCULAR Hepatobiliary: Liver is unremarkable. Gallbladder surgically absent. Common bile duct dilated 9 mm with distal tapering. Pancreas: Unremarkable. No pancreatic ductal dilatation or surrounding inflammatory changes. Spleen: Normal in size without focal abnormality. Adrenals/Urinary Tract: There is nodular thickening throughout the left adrenal gland most likely nodular hyperplasia. Multiple small adenomas are possible. Right adrenal gland is unremarkable. Kidneys are normal in size, orientation and position. There is symmetric enhancement. Mild bilateral renal cortical thinning. No renal masses or stones. No hydronephrosis. Normal ureters. Bladder is unremarkable. Stomach/Bowel: There is a single loop of small bowel in the central to upper pelvis with wall thickening and adjacent hazy inflammation. There is no pneumatosis intestinalis. Remainder of the small bowel is normal in caliber with normal wall thickness  and no other associated mesenteric inflammation. Stomach and colon are unremarkable. Appendix not visualized. Lymphatic: No adenopathy.  Reproductive: Status post hysterectomy. No adnexal masses. Other: No abdominal wall hernia.  No ascites. Musculoskeletal: No fracture or acute finding. No osteoblastic or osteolytic lesions. Review of the MIP images confirms the above findings. IMPRESSION: CTA CHEST 1. No evidence of an aortic dissection. 2. No acute findings. 3. Mild coronary artery calcifications. CTA ABDOMEN AND PELVIS 1. 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 2. A short segment of small bowel shows wall thickening and adjacent inflammation. This lies in the pelvis. Given the findings in the superior mesenteric artery, focal small bowel ischemia is suspected. This could, alternatively, be inflammatory or infectious in etiology. No pneumatosis intestinalis or portal venous air. 3. No other significant vascular stenosis. 4. 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 No aneurysm or dissection. Critical Value/emergent results were called by telephone at the time of interpretation on 06/19/2017 at 6:03 pm to Dr. Carrie Mew , who verbally acknowledged these results. Electronically Signed   By: Lajean Manes M.D.   On: 06/19/2017 18:03   ASSESSMENT AND PLAN:   Carla [name] is an 56 y.o. female. With past medical history of GERD, hypertension, hyperlipidemia and morbid obesity came into the ED with a chief complaint of generalized abdominal pain for one week which has been worse for 3 days associated with nausea. Denies any hematemesis. Patient is admitted to surgery for ischemic bowel and hospitalist team is consulted for elevated blood pressure and palpitations.   # hypertensive urgency -from uncontrolled abdominal pain  Pain control. IV labetalol as needed, BP is better control.  #Palpitations-intermittent Per cardiology consult,all available 12-lead EKGs show sinus  rhythm.  #hypokalemia given potassium,follow-up level. Magnesium level is normal.  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 On heparin drip Nothing by mouth and IVF support. Angiogram and possible thrombectomy by Dr. Lucky Cowboy today.  Hyperlipidemia. Continue statin therapy Obesity. Tobacco abuse. Smoking cessation was counseled for 3-4 minutes.  I discussed with Dr. Rockey Situ. All the records are reviewed and case discussed with Care Management/Social Worker. Management plans discussed with the patient, family and they are in agreement.  CODE STATUS: Full Code  TOTAL TIME TAKING CARE OF THIS PATIENT: 39 minutes.   More than 50% of the time was spent in counseling/coordination of care: YES  POSSIBLE D/C IN 3 DAYS, DEPENDING ON CLINICAL CONDITION.   Demetrios Loll M.D on 06/20/2017 at 3:17 PM  Between 7am to 6pm - Pager - [phone]  After 6pm go to [email or web address] - [facility]

## 2017-06-20 NOTE — Op Note (Addendum)
[facility] Percutaneous Study/Intervention Procedural Note   Date: 06/20/2017  Surgeon(s): [name], MD  Assistants: none  Pre-operative Diagnosis: 1.  Acute Mesenteric ischemia 2. Embolus/thrombosis of the SMA 3. Distal aortic embolus/thrombosis with mild stenosis on CT   Post-operative diagnosis: Same  Procedure(s) Performed: 1. Ultrasound guidance for vascular access right femoral artery  2. Catheter placement into SMA into the secondary branches from right femoral approach 3. Aortogram and selective angiogram of the SMA  4.  Catheter directed thrombolytic therapy to the SMA with 4 mg of TPA instilled into the thrombus  5.  Mechanical thrombectomy to the SMA with the penumbra cat 6 device 6. Stent to the SMA with 7 mm diameter x 26 mm length and 7 mm diameter by 16 mm length balloon expandable covered life stream stents 7. StarClose closure device right femoral artery  Contrast: 55 cc  Fluoro time: 7.7 minutes  EBL: 300 cc  Anesthesia: Approximately 45 minutes of Moderate conscious sedation using 4 mg of Versed and 200 mcg of Fentanyl  Indications: Patient is a 56 y.o. female who has symptoms consistent with acute mesenteric ischemia. The patient has a CT scan showing a clear embolus with near occlusive situation in the proximal SMA. There was also mild embolus/thrombosis of the distal aorta on the CT scan. The patient is brought in for angiography for further evaluation and potential treatment. Risks and benefits are discussed and informed consent is obtained  Procedure: The patient was identified and appropriate procedural time out was performed. The patient was then placed supine on the table and prepped and draped in the usual sterile fashion.Moderate conscious sedation was administered during a face to face encounter with the patient throughout the procedure with my  supervision of the RN administering medicines and monitoring the patient's vital signs, pulse oximetry, telemetry and mental status throughout from the start of the procedure until the patient was taken to the recovery room. Ultrasound was used to evaluate the right common femoral artery. It was patent. A digital ultrasound image was acquired. A Seldinger needle was used to access the right common femoral artery under direct ultrasound guidance and a permanent image was performed. A 0.035 J wire was advanced without resistance and a 5Fr sheath was placed. Pigtail catheter was placed into the aorta and an AP aortogram was performed. This demonstrated renal arteries appeared to be widely patent and there was good flow in the celiac artery without a lot of SMA flow seen. The distal aorta had mild narrowing from the embolus/thrombus creating only about a 30% stenosis. We transitioned to the lateral projection to image the celiac and SMA. The lateral image demonstrated widely patent celiac artery with an abrupt occlusion of the SMA just beyond its origin with what appeared to be an embolus with near occlusive stenosis resultant. The patient was given 5000 units of IV heparin.We upsized to a 6 Fr sheath.  A C2 catheter was used to selectively cannulate the SMA. Based on her symptoms and these findings, I elected to treat this to try to improve the patient's clinical course. I crossed the lesion without difficulty with Glidewire and a C2 catheter and confirm intraluminal flow in the distal SMA beyond the primary and secondary branches. I then placed the sheath at the origin of the SMA and delivered 4 mg of TPA through the C2 catheter directly into the embolus and thrombus. This was allowed to dwell for approximately 10 minutes. The #6 catheter was then used  with multiple passes made to evacuate the thrombus from the SMA. Multiple passes were made which markedly improved and now had a flow lumen but a greater than  50% narrowing with some residual thrombus was still seen. I elected to place covered stents over this area. I then used a 7 mm diameter x 26 mm length balloon expandable stent to perform treatment of the SMA. I inflated the balloon to 8 atm. The first stent was placed at the location of the initial branch of the SMA but still left about a centimeter before the origin. A longer stent would not have fit through the 6 [place] sheath. A second life stream stent was then deployed and this was 7 mm in diameter by 16 mm in length hanging back into the aorta a couple of millimeters and overlapping into the previous stent a couple of millimeters. This was inflated to 12 atm. On completion angiogram following this, less than 10% residual stenosis was identified and in both the AP and lateral projection there was brisk flow into the SMA and its branches at this point. At this point, I elected to terminate the procedure. The diagnostic catheter was removed. StarClose closure device was deployed in usual fashion with excellent hemostatic result. The patient was taken to the recovery room in stable condition having tolerated the procedure well.     Findings:near occlusive embolus/thrombus of the proximal SMA with scant flow distally on initial imaging. The celiac artery is widely patent. The renal arteries were widely patent. There was mild thrombus/embolus in the distal aorta creating a less than 30% stenosis.  Disposition: Patient was taken to the recovery room in stable condition having tolerated the procedure well.  Complications: None  [name] 06/20/2017 4:48 PM   This note was created with Dragon Medical transcription system. Any errors in dictation are purely unintentional.

## 2017-06-21 ENCOUNTER — Telehealth: Payer: Self-pay | Admitting: Pharmacy Technician

## 2017-06-21 ENCOUNTER — Encounter: Payer: Self-pay | Admitting: Vascular Surgery

## 2017-06-21 DIAGNOSIS — K55069 Acute infarction of intestine, part and extent unspecified: Secondary | ICD-10-CM

## 2017-06-21 DIAGNOSIS — K55059 Acute (reversible) ischemia of intestine, part and extent unspecified: Secondary | ICD-10-CM | POA: Diagnosis present

## 2017-06-21 LAB — CBC
HCT: 30.6 % — ABNORMAL LOW (ref 35.0–47.0)
Hemoglobin: 10.5 g/dL — ABNORMAL LOW (ref 12.0–16.0)
MCH: 30.3 pg (ref 26.0–34.0)
MCHC: 34.4 g/dL (ref 32.0–36.0)
MCV: 88.1 fL (ref 80.0–100.0)
Platelets: 229 10*3/uL (ref 150–440)
RBC: 3.47 MIL/uL — ABNORMAL LOW (ref 3.80–5.20)
RDW: 14.8 % — ABNORMAL HIGH (ref 11.5–14.5)
WBC: 10.7 10*3/uL (ref 3.6–11.0)

## 2017-06-21 LAB — HIV ANTIBODY (ROUTINE TESTING W REFLEX): HIV Screen 4th Generation wRfx: NONREACTIVE

## 2017-06-21 LAB — LACTIC ACID, PLASMA
Lactic Acid, Venous: 1.2 mmol/L (ref 0.5–1.9)
Lactic Acid, Venous: 1.2 mmol/L (ref 0.5–1.9)

## 2017-06-21 LAB — BASIC METABOLIC PANEL
Anion gap: 7 (ref 5–15)
BUN: 6 mg/dL (ref 6–20)
CO2: 27 mmol/L (ref 22–32)
Calcium: 7.9 mg/dL — ABNORMAL LOW (ref 8.9–10.3)
Chloride: 106 mmol/L (ref 101–111)
Creatinine, Ser: 0.67 mg/dL (ref 0.44–1.00)
GFR calc Af Amer: >60 mL/min (ref >60)
GFR calc non Af Amer: >60 mL/min (ref >60)
Glucose, Bld: 117 mg/dL — ABNORMAL HIGH (ref 65–99)
Potassium: 3.5 mmol/L (ref 3.5–5.1)
Sodium: 140 mmol/L (ref 135–145)

## 2017-06-21 LAB — HEPARIN LEVEL (UNFRACTIONATED)
Heparin Unfractionated: 0.28 IU/mL — ABNORMAL LOW (ref 0.30–0.70)
Heparin Unfractionated: 0.47 IU/mL (ref 0.30–0.70)

## 2017-06-21 MED ORDER — MORPHINE SULFATE 2 MG/ML IV SOLN
INTRAVENOUS | Status: DC
  Administered 2017-06-21: 15:00:00 via INTRAVENOUS
  Administered 2017-06-21: 7.5 mg via INTRAVENOUS
  Administered 2017-06-22: 23:00:00 via INTRAVENOUS
  Administered 2017-06-22: 12 mg via INTRAVENOUS
  Administered 2017-06-23: 20:00:00 via INTRAVENOUS
  Administered 2017-06-23: 9 mg via INTRAVENOUS
  Administered 2017-06-23: 15 mg via INTRAVENOUS
  Administered 2017-06-23: 6 mg via INTRAVENOUS
  Administered 2017-06-24: 3 mg via INTRAVENOUS
  Administered 2017-06-24: 16.5 mg via INTRAVENOUS
  Administered 2017-06-24: 18 mg via INTRAVENOUS
  Filled 2017-06-21 (×4): qty 30

## 2017-06-21 MED ORDER — SODIUM CHLORIDE 0.9% FLUSH: 9.0000 mL | INTRAVENOUS | Status: DC | PRN

## 2017-06-21 MED ORDER — DIPHENHYDRAMINE HCL 12.5 MG/5ML PO ELIX
12.5000 mg | ORAL_SOLUTION | Freq: Four times a day (QID) | ORAL | Status: DC | PRN
  Filled 2017-06-21: qty 5

## 2017-06-21 MED ORDER — DIPHENHYDRAMINE HCL 50 MG/ML IJ SOLN: 12.5000 mg | Freq: Four times a day (QID) | INTRAMUSCULAR | Status: DC | PRN

## 2017-06-21 MED ORDER — ONDANSETRON HCL 4 MG/2ML IJ SOLN: 4.0000 mg | Freq: Four times a day (QID) | INTRAMUSCULAR | Status: DC | PRN

## 2017-06-21 MED ORDER — CHLORHEXIDINE GLUCONATE 0.12 % MT SOLN
15.0000 mL | Freq: Two times a day (BID) | OROMUCOSAL | Status: DC
  Administered 2017-06-21 – 2017-06-27 (×11): 15 mL via OROMUCOSAL
  Filled 2017-06-21 (×10): qty 15

## 2017-06-21 MED ORDER — ORAL CARE MOUTH RINSE
15.0000 mL | Freq: Two times a day (BID) | OROMUCOSAL | Status: DC
  Administered 2017-06-23 – 2017-06-27 (×8): 15 mL via OROMUCOSAL

## 2017-06-21 MED ORDER — NALOXONE HCL 0.4 MG/ML IJ SOLN: 0.4000 mg | INTRAMUSCULAR | Status: DC | PRN

## 2017-06-21 MED ORDER — MORPHINE SULFATE (PF) 2 MG/ML IV SOLN
2.0000 mg | INTRAVENOUS | Status: DC | PRN
  Administered 2017-06-21 (×2): 2 mg via INTRAVENOUS
  Filled 2017-06-21 (×2): qty 1

## 2017-06-21 MED ORDER — SODIUM CHLORIDE 0.9 % IV SOLN: INTRAVENOUS | Status: DC

## 2017-06-21 MED ORDER — HEPARIN BOLUS VIA INFUSION
1000.0000 [IU] | Freq: Once | INTRAVENOUS | Status: AC
Start: 2017-06-21 — End: 2017-06-21
  Administered 2017-06-21: 1000 [IU] via INTRAVENOUS
  Filled 2017-06-21: qty 1000

## 2017-06-21 NOTE — Progress Notes (Signed)
ANTICOAGULATION CONSULT NOTE - Follow Up Consult  Pharmacy Consult for Heparin  Indication: mesenteric ischemia  No Known Allergies  Patient Measurements: Height: 5\' 2"  (157.5 cm) Weight: 225 lb (102.1 kg) IBW/kg (Calculated) : 50.1 Heparin Dosing Weight: 74.7 kg   Vital Signs: Temp: 98.2 F (36.8 C) (08/30 1554) Temp Source: Oral (08/30 1554) BP: 117/71 (08/30 2121) Pulse Rate: 75 (08/30 2121)  Labs:  Recent Labs  06/19/17 1314 06/19/17 2007 06/20/17 0209  06/20/17 1827 06/21/17 0256 06/21/17 0923 06/21/17 2035  HGB 14.4  --  12.3  --   --  10.5*  --   --   HCT 43.5  --  36.1  --   --  30.6*  --   --   PLT 317  --  240  --   --  229  --   --   APTT  --  103*  --   --   --   --   --   --   LABPROT  --  14.6  --   --   --   --   --   --   INR  --  1.15  --   --   --   --   --   --   HEPARINUNFRC  --   --  0.28*  < > 0.43  --  0.28* 0.47  CREATININE 0.74  --  0.63  --   --  0.67  --   --   TROPONINI <0.03  --   --   --   --   --   --   --   < > = values in this interval not displayed.  Estimated Creatinine Clearance: 86.8 mL/min (by C-G formula based on SCr of 0.67 mg/dL).   Medications:  Prescriptions Prior to Admission  Medication Sig Dispense Refill Last Dose  . aspirin EC 81 MG tablet Take 81 mg by mouth daily.   06/18/2017 at 1200  . atorvastatin (LIPITOR) 80 MG tablet Take 80 mg by mouth at bedtime.   06/16/2017 at pm  . gabapentin (NEURONTIN) 300 MG capsule Take 300 mg by mouth 2 (two) times daily.   06/05/2017 at unknown  . lisinopril (PRINIVIL,ZESTRIL) 40 MG tablet Take 40 mg by mouth daily.   06/18/2017 at pm  . metoprolol tartrate (LOPRESSOR) 25 MG tablet Take 25 mg by mouth 2 (two) times daily.   06/18/2017 at 1200  . omeprazole (PRILOSEC) 20 MG capsule Take 20 mg by mouth 2 (two) times daily.   06/05/2017 at unknown  . traMADol (ULTRAM) 50 MG tablet Take 50 mg by mouth every 6 (six) hours as needed for moderate pain.    PRN at PRN    Assessment: 56 y/o F  admitted with ischemic bowel and hypertensive urgency. 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 Patient's heparin is currently infusing at 1,500 units/hr.   Goal of Therapy:  Heparin level 0.3-0.7 units/ml Monitor platelets by anticoagulation protocol: Yes   Plan:  Continue heparin drip at 1500 units/hr. Will recheck anti-Xa level with am labs.   Pharmacy will continue to monitor and adjust per consult.    Simpson,Michael L 06/21/2017,9:43 PM

## 2017-06-21 NOTE — Progress Notes (Signed)
Visit patient this afternoon. She has ongoing pain that is no better than this morning but this morning was better than yesterday. She has no nausea or vomiting and in fact is hungry.  Vital signs reviewed heart rate in the 80s.  Abdomen remains soft diffusely tender without peritoneal signs  Labs reviewed and no sign of acidosis.  This a patient with thrombosis of the SMA and distal aorta which is been cleared by Dr. dew with stenting. The patient is somewhat of a dilemma as all of her vital signs in subjective findings as well as physical exam suggests that she should be improving but her pain continues to be present. I will start her on some liquids but reexamine with the understanding that she may 90 to go to the operating room should she worsen or her vital signs change at any time.

## 2017-06-21 NOTE — Progress Notes (Signed)
Pharmacy Consult for Electrolyte Monitoring Indication: Hypokalemia  No Known Allergies  Patient Measurements: Height: 5\' 2"  (157.5 cm) Weight: 225 lb (102.1 kg) IBW/kg (Calculated) : 50.1  Vital Signs: Temp: 98.2 F (36.8 C) (08/30 1200) Temp Source: Oral (08/30 1200) BP: 123/79 (08/30 1200) Pulse Rate: 74 (08/30 1200) Intake/Output from previous day: 08/29 0701 - 08/30 0700 In: 3096.1 [I.V.:2546.1; IV Piggyback:550] Out: 900 [Urine:900] Intake/Output from this shift: No intake/output data recorded.  Labs:  Recent Labs  06/18/17 1954 06/19/17 1314 06/19/17 2007 06/20/17 0209 06/20/17 1827 06/21/17 0256  WBC 13.5* 17.0*  --  13.5*  --  10.7  HGB 14.3 14.4  --  12.3  --  10.5*  HCT 42.7 43.5  --  36.1  --  30.6*  PLT 294 317  --  240  --  229  APTT  --   --  103*  --   --   --   CREATININE 1.36* 0.74  --  0.63  --  0.67  MG  --   --  1.7  --  2.4  --   ALBUMIN 3.9  --   --   --   --   --   PROT 8.4*  --   --   --   --   --   AST 23  --   --   --   --   --   ALT 38  --   --   --   --   --   ALKPHOS 135*  --   --   --   --   --   BILITOT 0.6  --   --   --   --   --    Estimated Creatinine Clearance: 86.8 mL/min (by C-G formula based on SCr of 0.67 mg/dL).  Sodium (mmol/L)  Date Value  06/21/2017 140  02/12/2015 140   Potassium (mmol/L)  Date Value  06/21/2017 3.5  02/12/2015 2.9 (L)   Calcium (mg/dL)  Date Value  06/21/2017 7.9 (L)   Calcium, Total (mg/dL)  Date Value  02/12/2015 9.0    Assessment: Pharmacy consulted to assist in managing electrolytes in this 56 y/o F with mesenteric ischemia. Patient received Potassium 46mEq IV and magnesium 2g IV on 8/29. Patient is currently on D5LR at 161mL/hr.   Plan:  No replacement warranted at this time. Will recheck electrolytes with am labs.   Pharmacy will continue to monitor and adjust per consult.   Carla [name] L 06/21/2017,1:44 PM

## 2017-06-21 NOTE — Telephone Encounter (Signed)
Provided patient with new packet information for [facility].  Provided patient with information about [facility] and [employer].  [facility]

## 2017-06-21 NOTE — Progress Notes (Signed)
[facility]  Daily Progress Note   Subjective  - 1 Day Post-Op  Feels better this morning but still having a fair bit of [name]. When sits when anyone touches her. Making urine. Her vital signs are pretty much normal with a normal heart rate and blood pressure. Her lactate is normal. Her white count has normalized. Her bicarbonate is normal. All of her objective signs seemed to be markedly improved. She asks for something to eat this morning which is encouraging as well.  Objective Vitals:   06/21/17 0600 06/21/17 0700 06/21/17 0744 06/21/17 0800  BP: 115/70 (!) 97/53  113/73  Pulse: 77 73  86  Resp: (!) 24 (!) 23  11  Temp:   98.1 F (36.7 C)   TempSrc:   Oral   SpO2: 95% 95%  97%  Weight:      Height:        Intake/Output Summary (Last 24 hours) at 06/21/17 0951 Last data filed at 06/21/17 0745  Gross per 24 hour  Intake          2530.61 ml  Output              900 ml  Net          1630.61 ml    PULM  CTAB CV  RRR VASC  Abdomen remains tender. Access site is without hematoma.  Laboratory CBC    Component Value Date/Time   WBC 10.7 06/21/2017 0256   HGB 10.5 (L) 06/21/2017 0256   HGB 13.4 02/12/2015 1137   HCT 30.6 (L) 06/21/2017 0256   HCT 40.9 02/12/2015 1137   PLT 229 06/21/2017 0256   PLT 272 02/12/2015 1137    BMET    Component Value Date/Time   NA 140 06/21/2017 0256   NA 140 02/12/2015 1137   K 3.5 06/21/2017 0256   K 2.9 (L) 02/12/2015 1137   CL 106 06/21/2017 0256   CL 107 02/12/2015 1137   CO2 27 06/21/2017 0256   CO2 27 02/12/2015 1137   GLUCOSE 117 (H) 06/21/2017 0256   GLUCOSE 120 (H) 02/12/2015 1137   BUN 6 06/21/2017 0256   BUN 15 02/12/2015 1137   CREATININE 0.67 06/21/2017 0256   CREATININE 0.78 02/12/2015 1137   CALCIUM 7.9 (L) 06/21/2017 0256   CALCIUM 9.0 02/12/2015 1137   GFRNONAA >60 06/21/2017 0256   GFRNONAA >60 02/12/2015 1137   GFRAA >60 06/21/2017 0256   GFRAA >60 02/12/2015 1137     Assessment/Planning: POD #1 s/p SMA thrombectomy, thrombolysis, and covered stent placement   Seems to be doing a fair bit better but her continued [name] is somewhat worrisome.  Dr. Burt Knack has already seen her this morning and we'll reassess her later. If she continues to have [name], he is considering laparotomy for full evaluation and she could certainly have segmental areas of ischemia that were present even prior to revascularization.  Her perfusion should be markedly improved but I would continue anticoagulation.  I would still sitting this was of cardiac origin even with a normal echocardiogram. Consideration for transesophageal echocardiogram could be given, but even if negative I would treat this like a cardiac embolus and maintain anticoagulation and consider outpatient telemetry monitoring.    Carla [name]  06/21/2017, 9:51 AM

## 2017-06-21 NOTE — Progress Notes (Signed)
Spoke with Dr.Cooper regarding Pharmacy recommendations to switch to PCA pump for better pain control. Dr. Burt Knack acknowledged that PCA pump was acceptable. Also mentioned that pharmacy had concerns about needing heparin bolus and rate adjustment based on lab result. Dr.Cooper verbalized that bolus was acceptable. He will return later this am to reassess patient.Will continue to assess.

## 2017-06-21 NOTE — Progress Notes (Signed)
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 dew. Today she states she feels better but still has pain. She's been medicated "only" 3 times last night this report coming from the RN. This seems to be an improvement over the previous 12 hours. Patient is reportedly having bowel sounds from the RN as well and the patient is hungry.  Patient was seen last night and again this morning by Dr. Tama High but not documented. He and I spoke concerning his visits and her progress.  Objective: Vital signs in last 24 hours: Temp:  [98.1 F (36.7 C)-99.7 F (37.6 C)] 98.1 F (36.7 C) (08/30 0744) Pulse Rate:  [71-94] 86 (08/30 0800) Resp:  [10-25] 11 (08/30 0800) BP: (95-150)/(48-96) 113/73 (08/30 0800) SpO2:  [94 %-100 %] 97 % (08/30 0800) Weight:  [225 lb (102.1 kg)] 225 lb (102.1 kg) (08/29 1507) Last BM Date: 06/17/17  Intake/Output from previous day: 08/29 0701 - 08/30 0700 In: 3096.1 [I.V.:2546.1; IV Piggyback:550] Out: 900 [Urine:900] Intake/Output this shift: No intake/output data recorded.  Physical exam:  Awake alert somewhat tearful. Vital signs reviewed and stable  Abdomen is soft tender diffusely as before but somewhat less so. No peritoneal signs Calves are nontender No icterus no jaundice  Lab Results: CBC   Recent Labs  06/20/17 0209 06/21/17 0256  WBC 13.5* 10.7  HGB 12.3 10.5*  HCT 36.1 30.6*  PLT 240 229   BMET  Recent Labs  06/20/17 0209 06/20/17 1827 06/21/17 0256  NA 139  --  140  K 3.1* 4.1 3.5  CL 105  --  106  CO2 26  --  27  GLUCOSE 113*  --  117*  BUN 11  --  6  CREATININE 0.63  --  0.67  CALCIUM 8.4*  --  7.9*   PT/INR  Recent Labs  06/19/17 2007  LABPROT 14.6  INR 1.15   ABG No results for input(s): PHART, HCO3 in the last 72 hours.  Invalid input(s): PCO2, PO2  Studies/Results: Dg Chest 2 View  Result Date:  06/19/2017 CLINICAL DATA:  Left chest pain for 3 days EXAM: CHEST  2 VIEW COMPARISON:  June 18, 2017 FINDINGS: The heart size and mediastinal contours are within normal limits. There is no focal infiltrate, pulmonary edema, or pleural effusion. Degenerative joint changes of the spine are noted. Prior cholecystectomy clips are noted. IMPRESSION: No active cardiopulmonary disease. Electronically Signed   By: Abelardo Diesel M.D.   On: 06/19/2017 13:54   Ct Angio Chest Aorta W And/or Wo Contrast  Result Date: 06/19/2017 CLINICAL DATA:  Pt in with co left sternal chest pain x 2 days no cardiac history. States has had some shob and nausea with pain. EXAM: CT ANGIOGRAPHY CHEST, ABDOMEN AND PELVIS TECHNIQUE: Multidetector CT imaging through the chest, abdomen and pelvis was performed using the standard protocol during bolus administration of intravenous contrast. Multiplanar reconstructed images and MIPs were obtained and reviewed to evaluate the vascular anatomy. CONTRAST:  125 mL of Isovue 370 intravenous contrast COMPARISON:  Abdomen and pelvis CT, 04/09/2015 FINDINGS: CTA CHEST FINDINGS Cardiovascular: Heart normal in size and configuration. Mild coronary artery calcifications. Thoracic aorta is normal in caliber. There are minor aortic atherosclerotic calcifications along the arch. Great vessels are normal in caliber. No aortic dissection. Aortic arch branch vessels are widely patent. Mediastinum/Nodes: No neck base, axillary, mediastinal or hilar masses or adenopathy. Trachea is widely  patent. Esophagus is unremarkable. Normal thyroid. Lungs/Pleura: Lungs are clear. No pleural effusion or pneumothorax. Musculoskeletal: No fracture or acute finding. No osteoblastic or osteolytic lesions. Review of the MIP images confirms the above findings. CTA ABDOMEN AND PELVIS FINDINGS VASCULAR Aorta: Atherosclerotic disease is noted throughout the abdominal aorta, mostly the infrarenal portion. There is eccentric plaque with a  component of thrombus along the right aspect of the infrarenal aorta extending to the bifurcation. No significant narrowing of the abdominal aorta. No aneurysm. No dissection. Celiac: Patent without evidence of aneurysm, dissection, vasculitis or significant stenosis. SMA: There is low attenuation material extending for 2.2 cm from the origin of the superior mesenteric artery distally. This causes significant narrowing of the vessel, 70-90%. This is likely thrombus. Distal to this, the vessel is widely patent as are its branch vessels. Renals: Single right and 2 left renal arteries are widely patent. IMA: Some plaque is evident at the origin of the inferior mesenteric artery with at least mild narrowing. The vessel is patent. Inflow: Mild atherosclerotic disease noted along the common iliac arteries without significant stenosis. External iliac vessels and common femoral arteries are widely patent. Veins: No obvious venous abnormality within the limitations of this arterial phase study. Review of the MIP images confirms the above findings. NON-VASCULAR Hepatobiliary: Liver is unremarkable. Gallbladder surgically absent. Common bile duct dilated 9 mm with distal tapering. Pancreas: Unremarkable. No pancreatic ductal dilatation or surrounding inflammatory changes. Spleen: Normal in size without focal abnormality. Adrenals/Urinary Tract: There is nodular thickening throughout the left adrenal gland most likely nodular hyperplasia. Multiple small adenomas are possible. Right adrenal gland is unremarkable. Kidneys are normal in size, orientation and position. There is symmetric enhancement. Mild bilateral renal cortical thinning. No renal masses or stones. No hydronephrosis. Normal ureters. Bladder is unremarkable. Stomach/Bowel: There is a single loop of small bowel in the central to upper pelvis with wall thickening and adjacent hazy inflammation. There is no pneumatosis intestinalis. Remainder of the small bowel is  normal in caliber with normal wall thickness and no other associated mesenteric inflammation. Stomach and colon are unremarkable. Appendix not visualized. Lymphatic: No adenopathy. Reproductive: Status post hysterectomy. No adnexal masses. Other: No abdominal wall hernia.  No ascites. Musculoskeletal: No fracture or acute finding. No osteoblastic or osteolytic lesions. Review of the MIP images confirms the above findings. IMPRESSION: CTA CHEST 1. No evidence of an aortic dissection. 2. No acute findings. 3. Mild coronary artery calcifications. CTA ABDOMEN AND PELVIS 1. 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 2. A short segment of small bowel shows wall thickening and adjacent inflammation. This lies in the pelvis. Given the findings in the superior mesenteric artery, focal small bowel ischemia is suspected. This could, alternatively, be inflammatory or infectious in etiology. No pneumatosis intestinalis or portal venous air. 3. No other significant vascular stenosis. 4. 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 No aneurysm or dissection. Critical Value/emergent results were called by telephone at the time of interpretation on 06/19/2017 at 6:03 pm to Dr. Carrie Mew , who verbally acknowledged these results. Electronically Signed   By: Lajean Manes M.D.   On: 06/19/2017 18:03   Ct Angio Abd/pel W And/or Wo Contrast  Result Date: 06/19/2017 CLINICAL DATA:  Pt in with co left sternal chest pain x 2 days no cardiac history. States has had some shob and nausea with pain. EXAM: CT ANGIOGRAPHY CHEST,  ABDOMEN AND PELVIS TECHNIQUE: Multidetector CT imaging through the chest, abdomen and pelvis was performed using the standard protocol during bolus administration of intravenous contrast. Multiplanar reconstructed images and MIPs were obtained and reviewed to  evaluate the vascular anatomy. CONTRAST:  125 mL of Isovue 370 intravenous contrast COMPARISON:  Abdomen and pelvis CT, 04/09/2015 FINDINGS: CTA CHEST FINDINGS Cardiovascular: Heart normal in size and configuration. Mild coronary artery calcifications. Thoracic aorta is normal in caliber. There are minor aortic atherosclerotic calcifications along the arch. Great vessels are normal in caliber. No aortic dissection. Aortic arch branch vessels are widely patent. Mediastinum/Nodes: No neck base, axillary, mediastinal or hilar masses or adenopathy. Trachea is widely patent. Esophagus is unremarkable. Normal thyroid. Lungs/Pleura: Lungs are clear. No pleural effusion or pneumothorax. Musculoskeletal: No fracture or acute finding. No osteoblastic or osteolytic lesions. Review of the MIP images confirms the above findings. CTA ABDOMEN AND PELVIS FINDINGS VASCULAR Aorta: Atherosclerotic disease is noted throughout the abdominal aorta, mostly the infrarenal portion. There is eccentric plaque with a component of thrombus along the right aspect of the infrarenal aorta extending to the bifurcation. No significant narrowing of the abdominal aorta. No aneurysm. No dissection. Celiac: Patent without evidence of aneurysm, dissection, vasculitis or significant stenosis. SMA: There is low attenuation material extending for 2.2 cm from the origin of the superior mesenteric artery distally. This causes significant narrowing of the vessel, 70-90%. This is likely thrombus. Distal to this, the vessel is widely patent as are its branch vessels. Renals: Single right and 2 left renal arteries are widely patent. IMA: Some plaque is evident at the origin of the inferior mesenteric artery with at least mild narrowing. The vessel is patent. Inflow: Mild atherosclerotic disease noted along the common iliac arteries without significant stenosis. External iliac vessels and common femoral arteries are widely patent. Veins: No obvious venous  abnormality within the limitations of this arterial phase study. Review of the MIP images confirms the above findings. NON-VASCULAR Hepatobiliary: Liver is unremarkable. Gallbladder surgically absent. Common bile duct dilated 9 mm with distal tapering. Pancreas: Unremarkable. No pancreatic ductal dilatation or surrounding inflammatory changes. Spleen: Normal in size without focal abnormality. Adrenals/Urinary Tract: There is nodular thickening throughout the left adrenal gland most likely nodular hyperplasia. Multiple small adenomas are possible. Right adrenal gland is unremarkable. Kidneys are normal in size, orientation and position. There is symmetric enhancement. Mild bilateral renal cortical thinning. No renal masses or stones. No hydronephrosis. Normal ureters. Bladder is unremarkable. Stomach/Bowel: There is a single loop of small bowel in the central to upper pelvis with wall thickening and adjacent hazy inflammation. There is no pneumatosis intestinalis. Remainder of the small bowel is normal in caliber with normal wall thickness and no other associated mesenteric inflammation. Stomach and colon are unremarkable. Appendix not visualized. Lymphatic: No adenopathy. Reproductive: Status post hysterectomy. No adnexal masses. Other: No abdominal wall hernia.  No ascites. Musculoskeletal: No fracture or acute finding. No osteoblastic or osteolytic lesions. Review of the MIP images confirms the above findings. IMPRESSION: CTA CHEST 1. No evidence of an aortic dissection. 2. No acute findings. 3. Mild coronary artery calcifications. CTA ABDOMEN AND PELVIS 1. 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 2. A short segment of small bowel shows wall thickening and adjacent inflammation. This lies in the pelvis. Given the findings in the superior mesenteric artery, focal small bowel ischemia is suspected. This could, alternatively, be  inflammatory or infectious in etiology. No  pneumatosis intestinalis or portal venous air. 3. No other significant vascular stenosis. 4. 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 No aneurysm or dissection. Critical Value/emergent results were called by telephone at the time of interpretation on 06/19/2017 at 6:03 pm to Dr. Carrie Mew , who verbally acknowledged these results. Electronically Signed   By: Lajean Manes M.D.   On: 06/19/2017 18:03    Anti-infectives: Anti-infectives    Start     Dose/Rate Route Frequency Ordered Stop   06/20/17 1100  cefUROXime (ZINACEF) 1.5 g in dextrose 5 % 50 mL IVPB     1.5 g 100 mL/hr over 30 Minutes Intravenous 30 min pre-op 06/20/17 1100 06/20/17 1625   06/20/17 0230  piperacillin-tazobactam (ZOSYN) IVPB 3.375 g     3.375 g 12.5 mL/hr over 240 Minutes Intravenous Every 8 hours 06/20/17 0143     06/20/17 0000  piperacillin-tazobactam (ZOSYN) IVPB 3.375 g  Status:  Discontinued     3.375 g 12.5 mL/hr over 240 Minutes Intravenous Every 8 hours 06/19/17 2000 06/20/17 0143   06/19/17 2015  piperacillin-tazobactam (ZOSYN) IVPB 3.375 g     3.375 g 100 mL/hr over 30 Minutes Intravenous  Once 06/19/17 2000 06/19/17 2301   06/19/17 1930  piperacillin-tazobactam (ZOSYN) IVPB 3.375 g  Status:  Discontinued     3.375 g 12.5 mL/hr over 240 Minutes Intravenous Every 6 hours 06/19/17 1924 06/19/17 2000      Assessment/Plan:  Labs are personally reviewed. White blood cell count is normalized and bicarbonate remains normal. Lactate remains normal but there was a spike last night. With all this in mind and the patient's continued pain which may consider either laparoscopy or a laparotomy either later today or tomorrow should she not progress. I had spoken with her yesterday and with Dr. dew concerning the potential for an exploratory laparotomy if she did not promptly improved. She has  subjectively improved as well as objectively and remains with labs that are not showing ischemia and her vital signs remained normal. I will reexamine her later today.  Florene Glen, MD, FACS  06/21/2017

## 2017-06-21 NOTE — Progress Notes (Signed)
[facility] [place] NAME: Carla [name]    MR#:  [id number]  DATE OF BIRTH:  Jun 23, 1961  SUBJECTIVE:  CHIEF COMPLAINT:   Chief Complaint  Patient presents with  . Chest Pain   Still abdominal pain and nausea, on heparin drip. REVIEW OF SYSTEMS:  Review of Systems  Constitutional: Negative for chills, fever and malaise/fatigue.  HENT: Negative for sore throat.   Eyes: Negative for blurred vision and double vision.  Respiratory: Negative for cough, hemoptysis, shortness of breath, wheezing and stridor.   Cardiovascular: Negative for chest pain, palpitations, orthopnea and leg swelling.  Gastrointestinal: Positive for abdominal pain and nausea. Negative for blood in stool, diarrhea, melena and vomiting.  Genitourinary: Negative for dysuria, flank pain and hematuria.  Musculoskeletal: Negative for back pain and joint pain.  Skin: Negative for rash.  Neurological: Negative for dizziness, sensory change, focal weakness, seizures, loss of consciousness, weakness and headaches.  Endo/Heme/Allergies: Negative for polydipsia.  Psychiatric/Behavioral: Negative for depression. The patient is not nervous/anxious.     DRUG ALLERGIES:  No Known Allergies VITALS:  Blood pressure 123/79, pulse 74, temperature 98.2 F (36.8 C), temperature source Oral, resp. rate 16, height 5\' 2"  (1.575 m), weight 225 lb (102.1 kg), SpO2 100 %. PHYSICAL EXAMINATION:  Physical Exam  Constitutional: She is oriented to person, place, and time and well-developed, well-nourished, and in no distress.  obese  HENT:  Head: Normocephalic.  Mouth/Throat: Oropharynx is clear and moist.  Eyes: Pupils are equal, round, and reactive to light. Conjunctivae and EOM are normal. No scleral icterus.  Neck: Normal range of motion. Neck supple. No JVD present. No tracheal deviation present.  Cardiovascular: Normal rate, regular rhythm and normal heart sounds.  Exam reveals no gallop.    No murmur heard. Pulmonary/Chest: Effort normal and breath sounds normal. No respiratory distress. She has no wheezes. She has no rales.  Abdominal: Soft. Bowel sounds are normal. She exhibits no distension. There is tenderness. There is no rebound.  Diffuse tenderness  Musculoskeletal: Normal range of motion. She exhibits no edema or tenderness.  Neurological: She is alert and oriented to person, place, and time. No cranial nerve deficit.  Skin: No rash noted. No erythema.  Psychiatric: Affect normal.   LABORATORY PANEL:  Female CBC  Recent Labs Lab 06/21/17 0256  WBC 10.7  HGB 10.5*  HCT 30.6*  PLT 229   ------------------------------------------------------------------------------------------------------------------ Chemistries   Recent Labs Lab 06/18/17 1954  06/20/17 1827 06/21/17 0256  NA 136  < >  --  140  K 3.4*  < > 4.1 3.5  CL 98*  < >  --  106  CO2 26  < >  --  27  GLUCOSE 167*  < >  --  117*  BUN 16  < >  --  6  CREATININE 1.36*  < >  --  0.67  CALCIUM 9.6  < >  --  7.9*  MG  --   < > 2.4  --   AST 23  --   --   --   ALT 38  --   --   --   ALKPHOS 135*  --   --   --   BILITOT 0.6  --   --   --   < > = values in this interval not displayed. RADIOLOGY:  No results found. ASSESSMENT AND PLAN:   Carla [name] is an 56 y.o. female. With past medical history  of GERD, hypertension, hyperlipidemia and morbid obesity came into the ED with a chief complaint of generalized abdominal pain for one week which has been worse for 3 days associated with nausea. Denies any hematemesis. Patient is admitted to surgery for ischemic bowel and hospitalist team is consulted for elevated blood pressure and palpitations.   # hypertensive urgency -from uncontrolled abdominal pain  Pain control. IV labetalol as needed, BP is better control.  #Palpitations-intermittent Per cardiology consult,all available 12-lead EKGs show sinus rhythm. Echo: No cardiac source of emboli was  indentified. EF 60%.  #hypokalemia given potassium and improved. Magnesium level is normal.  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 POD #1 s/p SMA thrombectomy, thrombolysis, and covered stent placement Continue heparin drip, consider cardiac origin even with a normal echocardiogram. Consideration for transesophageal echocardiogram could be given, but even if negative I would treat this like a cardiac embolus and maintain anticoagulation and consider outpatient telemetry monitoring per Dr. Lucky Cowboy.  Start liquid diet but she may needs to go to the operating room should she worsen or her vital signs change at any time per Dr. Lucky Cowboy.  Hyperlipidemia. Continue statin therapy Obesity. Tobacco abuse. Smoking cessation was counseled.  I discussed with Dr. Fletcher Anon for TEE. All the records are reviewed and case discussed with Care Management/Social Worker. Management plans discussed with the patient, family and they are in agreement.  CODE STATUS: Full Code  TOTAL TIME TAKING CARE OF THIS PATIENT: 33 minutes.   More than 50% of the time was spent in counseling/coordination of care: YES  POSSIBLE D/C IN 3 DAYS, DEPENDING ON CLINICAL CONDITION.   Demetrios Loll M.D on 06/21/2017 at 3:07 PM  Between 7am to 6pm - Pager - [phone]  After 6pm go to [email or web address] - [facility]

## 2017-06-21 NOTE — Progress Notes (Signed)
ANTICOAGULATION CONSULT NOTE - Follow Up Consult  Pharmacy Consult for Heparin  Indication: mesenteric ischemia  No Known Allergies  Patient Measurements: Height: 5\' 2"  (157.5 cm) Weight: 225 lb (102.1 kg) IBW/kg (Calculated) : 50.1 Heparin Dosing Weight: 74.7 kg   Vital Signs: Temp: 98.2 F (36.8 C) (08/30 1200) Temp Source: Oral (08/30 1200) BP: 123/79 (08/30 1200) Pulse Rate: 74 (08/30 1200)  Labs:  Recent Labs  06/18/17 1954 06/19/17 1314 06/19/17 2007  06/20/17 0209 06/20/17 0949 06/20/17 1827 06/21/17 0256 06/21/17 0923  HGB 14.3 14.4  --   --  12.3  --   --  10.5*  --   HCT 42.7 43.5  --   --  36.1  --   --  30.6*  --   PLT 294 317  --   --  240  --   --  229  --   APTT  --   --  103*  --   --   --   --   --   --   LABPROT  --   --  14.6  --   --   --   --   --   --   INR  --   --  1.15  --   --   --   --   --   --   HEPARINUNFRC  --   --   --   < > 0.28* 0.52 0.43  --  0.28*  CREATININE 1.36* 0.74  --   --  0.63  --   --  0.67  --   TROPONINI <0.03 <0.03  --   --   --   --   --   --   --   < > = values in this interval not displayed.  Estimated Creatinine Clearance: 86.8 mL/min (by C-G formula based on SCr of 0.67 mg/dL).   Medications:  Prescriptions Prior to Admission  Medication Sig Dispense Refill Last Dose  . aspirin EC 81 MG tablet Take 81 mg by mouth daily.   06/18/2017 at 1200  . atorvastatin (LIPITOR) 80 MG tablet Take 80 mg by mouth at bedtime.   06/16/2017 at pm  . gabapentin (NEURONTIN) 300 MG capsule Take 300 mg by mouth 2 (two) times daily.   06/05/2017 at unknown  . lisinopril (PRINIVIL,ZESTRIL) 40 MG tablet Take 40 mg by mouth daily.   06/18/2017 at pm  . metoprolol tartrate (LOPRESSOR) 25 MG tablet Take 25 mg by mouth 2 (two) times daily.   06/18/2017 at 1200  . omeprazole (PRILOSEC) 20 MG capsule Take 20 mg by mouth 2 (two) times daily.   06/05/2017 at unknown  . traMADol (ULTRAM) 50 MG tablet Take 50 mg by mouth every 6 (six) hours as needed  for moderate pain.    PRN at PRN    Assessment: 56 y/o F admitted with ischemic bowel and hypertensive urgency. 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 Patient's heparin is currently infusing at 1,350 units/hr.   Goal of Therapy:  Heparin level 0.3-0.7 units/ml Monitor platelets by anticoagulation protocol: Yes   Plan:  Will bolus 1000 units and increase heparin to 1500 units/hr. Will obtain follow up anti-Xa level at 1900.   Pharmacy will continue to monitor and adjust per consult.    Simpson,Michael L 06/21/2017,1:48 PM

## 2017-06-21 NOTE — Consult Note (Signed)
Cardiology Consult    Patient ID: [name] MRN: [id number], DOB/AGE: 01-19-1961   Admit date: 06/19/2017 Date of Consult: 06/21/2017  Primary Physician: Patient, No Pcp Per Primary Cardiologist: Carla Mages. Fletcher Anon, MD (2013) Requesting Provider: Q. [name]  Patient Profile    Carla [name] is a 56 y.o. female with a history of HTN, HL, Bell's Palsy, GERD, and chest pain w/ neg MV (2013), who is being seen today for the evaluation of ? SMA embolus and need for TEE at the request of Dr. Bridgett [name].  Past Medical History   Past Medical History:  Diagnosis Date  . Abdominal aortic atherosclerosis ([place])    a. 05/2017 CTA abd/pelvis: significant atherosclerotic dzs of the infrarenal abd Ao w/ some mural thrombus. No aneurysm or dissection.  . Baker's cyst of knee, right    a. 07/2016 U/S: 4.1 x 1.4 x 2.9 cystic structure in R poplitetal fossa.  . Bell's palsy   . Chest pain    a. 08/2012 Lexiscan MV: EF 54%, non ischemia/infarct.  . Diastolic dysfunction    a. 05/2017 Echo: Ef 60-65%, no rwma, Gr1 DD, no source of cardiac emboli.  . 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 05/2017 CTA Abd/pelvis: apparent thrombus or embolus in prox SMA (70-90%); b. 05/2017 catheter directed tPA, mechanical thrombectomy, and stenting of the SMA.  [name] GERD (gastroesophageal reflux disease)   . Hyperlipidemia   . Hypertension   . Morbid obesity with BMI of 40.0-44.9, adult [facility])     Past Surgical History:  Procedure Laterality Date  . CHOLECYSTECTOMY    . VAGINAL HYSTERECTOMY    . VISCERAL ARTERY INTERVENTION N/A 06/20/2017   Procedure: Visceral Artery Intervention, possible aortic thrombectomy;  Surgeon: Carla Huxley, MD;  Location: [place] [facility];  Service: Cardiovascular;  Laterality: N/A;     Allergies  No Known Allergies  History of Present Illness    56 y/o ? with a h/o HTN, HL, GERD, and obesity.  In 2013, she was admitted with stroke like symptoms and negative w/u.  She was ultimately dx  with Bell's Palsy.  Echo @ the time showed nl LV fxn.  She did report c/p during that hospitalization and subsequently underwent outpt stress testing, which was non-ischemic.  She has not been seen by cardiology since then.  She says that over the years she has had intermittent sharp, left sided chest pain that may last days at a time and is worsened by deep breathing and certain position changes.  This typically occurs once every couple/months and resolves spontaneously.  Over the past few yrs, she has also noted intermittent palpitations associated with 'pounding' in her chest and dyspnea,, occurring about once/month, lasting about 30 mins, and resolving spontaneously.  Finally, over the past 6 months, she has noted some progression of baseline DOE.  She says that activity is limited due to a h/o arthritis, and in that setting, she experiences DOE after vacuuming only one room in her house.  She does not typically experience exertional chest pain.  She was in her usoh until about a week prior to admission, when she began to experience progressive upper abdominal discomfort that she initially attributed to constipation.  Despite use of stool softener and laxatives, with small volume bowel mvmts, abdominal pain worsened, prompting her to present to the [facility] ED on 8/27.  There, she exhibited leukocytosis (13.5  17.0) with nl lactic acid.  ECG was non-acute.  Troponin nl.  CTA  of the chest, abd, pelvis was performed and was neg for dissection but did show apparent thrombus vs embolus in the SMA.  Infrarenal Abd Ao mural thrombus/atherosclerosis and coronary Ca2+ were also noted.  Vascular surgery was consulted and pt was admitted and underwent catheter directed delivery of tPA with thrombectomy.  Since then, she has continued to have abdominal discomfort with stable labs and hemodynamics.  She has been maintained on heparin and concern remains for potential need for exploratory lap.    Echo was performed on 8/29  and showed nl EF w/ grade 1 diast dysfxn.  No source of embolus was noted.  She has remained in sinus rhythm on tele.  We've been asked to eval for ongoing concern for possible cardioembolic source of SMA embolus.  Inpatient Medications    . aspirin EC  81 mg Oral Daily  . atorvastatin  80 mg Oral QHS  . chlorhexidine  15 mL Mouth Rinse BID  . heparin  1,000 Units Intravenous Once  . lisinopril  40 mg Oral Daily  . mouth rinse  15 mL Mouth Rinse q12n4p  . metoprolol tartrate  25 mg Oral BID  . morphine   Intravenous Q4H  . pantoprazole (PROTONIX) IV  40 mg Intravenous Q24H    Family History    Family History  Problem Relation Age of Onset  . Hypertension Mother   . Heart disease Mother   . Heart attack Mother   . Breast cancer Mother   . Hypertension Father   . Breast cancer Paternal Aunt     Social History    Social History   Social History  . Marital status: Single    Spouse name: N/A  . Number of children: N/A  . Years of education: N/A   Occupational History  . Not on file.   Social History Main Topics  . Smoking status: Current Some Day Smoker    Packs/day: 0.50    Types: Cigarettes  . Smokeless tobacco: Never Used  . Alcohol use No  . Drug use: No  . Sexual activity: Not on file   Other Topics Concern  . Not on file   Social History Narrative  . No narrative on file     Review of Systems    General:  No chills, fever, night sweats or weight changes.  Cardiovascular:  +++ intermittent, sharp chest pain occurring once every few mos and lasting days @ a time, +++ dyspnea on exertion, no edema, orthopnea, +++ tachypalpitations @ least once/month x 30 mins, no paroxysmal nocturnal dyspnea. Dermatological: No rash, lesions/masses Respiratory: No cough, +++ dyspnea Urologic: No hematuria, dysuria Abdominal:   +++ nausea, +++ abd pain, no vomiting, diarrhea, bright red blood per rectum, melena, or hematemesis Neurologic:  No visual changes, wkns, changes  in mental status. All other systems reviewed and are otherwise negative except as noted above.  Physical Exam    Blood pressure 123/79, pulse 74, temperature 98.2 F (36.8 C), temperature source Oral, resp. rate 16, height 5\' 2"  (1.575 m), weight 225 lb (102.1 kg), SpO2 100 %.  General: Pleasant, NAD Psych: Flat affect. Neuro: Alert and oriented X 3. Moves all extremities spontaneously. HEENT: Normal  Neck: Supple without bruits or JVD. Lungs:  Resp regular and unlabored, diminished breath sounds bilat. Heart: RRR no s3, s4, or murmurs. Abdomen: Soft, diffusely tender, non-distended, BS + x 4.  Extremities: No clubbing, cyanosis or edema. DP/PT/Radials 2+ and equal bilaterally.  Labs     Recent  Labs  06/18/17 1954 06/19/17 1314  TROPONINI <0.03 <0.03   Lab Results  Component Value Date   WBC 10.7 06/21/2017   HGB 10.5 (L) 06/21/2017   HCT 30.6 (L) 06/21/2017   MCV 88.1 06/21/2017   PLT 229 06/21/2017    Recent Labs Lab 06/18/17 1954  06/21/17 0256  NA 136  < > 140  K 3.4*  < > 3.5  CL 98*  < > 106  CO2 26  < > 27  BUN 16  < > 6  CREATININE 1.36*  < > 0.67  CALCIUM 9.6  < > 7.9*  PROT 8.4*  --   --   BILITOT 0.6  --   --   ALKPHOS 135*  --   --   ALT 38  --   --   AST 23  --   --   GLUCOSE 167*  < > 117*  < > = values in this interval not displayed.  Radiology Studies    Dg Chest 2 View  Result Date: 06/19/2017 CLINICAL DATA:  Left chest pain for 3 days EXAM: CHEST  2 VIEW COMPARISON:  June 18, 2017 FINDINGS: The heart size and mediastinal contours are within normal limits. There is no focal infiltrate, pulmonary edema, or pleural effusion. Degenerative joint changes of the spine are noted. Prior cholecystectomy clips are noted. IMPRESSION: No active cardiopulmonary disease. Electronically Signed   By: Abelardo Diesel M.D.   On: 06/19/2017 13:54   Dg Chest 2 View  Result Date: 06/18/2017 CLINICAL DATA:  Acute shortness of breath. EXAM: CHEST  2 VIEW  COMPARISON:  03/24/2017 and prior radiographs FINDINGS: The cardiomediastinal silhouette is unremarkable. There is no evidence of focal airspace disease, pulmonary edema, suspicious pulmonary nodule/mass, pleural effusion, or pneumothorax. No acute bony abnormalities are identified. IMPRESSION: No active cardiopulmonary disease. Electronically Signed   By: Margarette Canada M.D.   On: 06/18/2017 20:38   Ct Angio Chest Aorta W And/or Wo Contrast  Result Date: 06/19/2017 CLINICAL DATA:  Pt in with co left sternal chest pain x 2 days no cardiac history. States has had some shob and nausea with pain. EXAM: CT ANGIOGRAPHY CHEST, ABDOMEN AND PELVIS TECHNIQUE: Multidetector CT imaging through the chest, abdomen and pelvis was performed using the standard protocol during bolus administration of intravenous contrast. Multiplanar reconstructed images and MIPs were obtained and reviewed to evaluate the vascular anatomy. CONTRAST:  125 mL of Isovue 370 intravenous contrast COMPARISON:  Abdomen and pelvis CT, 04/09/2015 FINDINGS: CTA CHEST FINDINGS Cardiovascular: Heart normal in size and configuration. Mild coronary artery calcifications. Thoracic aorta is normal in caliber. There are minor aortic atherosclerotic calcifications along the arch. Great vessels are normal in caliber. No aortic dissection. Aortic arch branch vessels are widely patent. Mediastinum/Nodes: No neck base, axillary, mediastinal or hilar masses or adenopathy. Trachea is widely patent. Esophagus is unremarkable. Normal thyroid. Lungs/Pleura: Lungs are clear. No pleural effusion or pneumothorax. Musculoskeletal: No fracture or acute finding. No osteoblastic or osteolytic lesions. Review of the MIP images confirms the above findings. CTA ABDOMEN AND PELVIS FINDINGS VASCULAR Aorta: Atherosclerotic disease is noted throughout the abdominal aorta, mostly the infrarenal portion. There is eccentric plaque with a component of thrombus along the right aspect of the  infrarenal aorta extending to the bifurcation. No significant narrowing of the abdominal aorta. No aneurysm. No dissection. Celiac: Patent without evidence of aneurysm, dissection, vasculitis or significant stenosis. SMA: There is low attenuation material extending for 2.2 cm from the origin  of the superior mesenteric artery distally. This causes significant narrowing of the vessel, 70-90%. This is likely thrombus. Distal to this, the vessel is widely patent as are its branch vessels. Renals: Single right and 2 left renal arteries are widely patent. IMA: Some plaque is evident at the origin of the inferior mesenteric artery with at least mild narrowing. The vessel is patent. Inflow: Mild atherosclerotic disease noted along the common iliac arteries without significant stenosis. External iliac vessels and common femoral arteries are widely patent. Veins: No obvious venous abnormality within the limitations of this arterial phase study. Review of the MIP images confirms the above findings. NON-VASCULAR Hepatobiliary: Liver is unremarkable. Gallbladder surgically absent. Common bile duct dilated 9 mm with distal tapering. Pancreas: Unremarkable. No pancreatic ductal dilatation or surrounding inflammatory changes. Spleen: Normal in size without focal abnormality. Adrenals/Urinary Tract: There is nodular thickening throughout the left adrenal gland most likely nodular hyperplasia. Multiple small adenomas are possible. Right adrenal gland is unremarkable. Kidneys are normal in size, orientation and position. There is symmetric enhancement. Mild bilateral renal cortical thinning. No renal masses or stones. No hydronephrosis. Normal ureters. Bladder is unremarkable. Stomach/Bowel: There is a single loop of small bowel in the central to upper pelvis with wall thickening and adjacent hazy inflammation. There is no pneumatosis intestinalis. Remainder of the small bowel is normal in caliber with normal wall thickness and no  other associated mesenteric inflammation. Stomach and colon are unremarkable. Appendix not visualized. Lymphatic: No adenopathy. Reproductive: Status post hysterectomy. No adnexal masses. Other: No abdominal wall hernia.  No ascites. Musculoskeletal: No fracture or acute finding. No osteoblastic or osteolytic lesions. Review of the MIP images confirms the above findings. IMPRESSION: CTA CHEST 1. No evidence of an aortic dissection. 2. No acute findings. 3. Mild coronary artery calcifications. CTA ABDOMEN AND PELVIS 1. 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 2. A short segment of small bowel shows wall thickening and adjacent inflammation. This lies in the pelvis. Given the findings in the superior mesenteric artery, focal small bowel ischemia is suspected. This could, alternatively, be inflammatory or infectious in etiology. No pneumatosis intestinalis or portal venous air. 3. No other significant vascular stenosis. 4. 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 No aneurysm or dissection. Critical Value/emergent results were called by telephone at the time of interpretation on 06/19/2017 at 6:03 pm to Dr. Carrie Mew , who verbally acknowledged these results. Electronically Signed   By: Lajean Manes M.D.   On: 06/19/2017 18:03   Ct Angio Abd/pel W And/or Wo Contrast  Result Date: 06/19/2017 CLINICAL DATA:  Pt in with co left sternal chest pain x 2 days no cardiac history. States has had some shob and nausea with pain. EXAM: CT ANGIOGRAPHY CHEST, ABDOMEN AND PELVIS TECHNIQUE: Multidetector CT imaging through the chest, abdomen and pelvis was performed using the standard protocol during bolus administration of intravenous contrast. Multiplanar reconstructed images and MIPs were obtained and reviewed to evaluate the vascular anatomy. CONTRAST:  125 mL of Isovue  370 intravenous contrast COMPARISON:  Abdomen and pelvis CT, 04/09/2015 FINDINGS: CTA CHEST FINDINGS Cardiovascular: Heart normal in size and configuration. Mild coronary artery calcifications. Thoracic aorta is normal in caliber. There are minor aortic atherosclerotic calcifications along the arch. Great vessels are normal in caliber. No aortic dissection. Aortic arch branch vessels are widely patent. Mediastinum/Nodes: No neck base, axillary, mediastinal or hilar masses  or adenopathy. Trachea is widely patent. Esophagus is unremarkable. Normal thyroid. Lungs/Pleura: Lungs are clear. No pleural effusion or pneumothorax. Musculoskeletal: No fracture or acute finding. No osteoblastic or osteolytic lesions. Review of the MIP images confirms the above findings. CTA ABDOMEN AND PELVIS FINDINGS VASCULAR Aorta: Atherosclerotic disease is noted throughout the abdominal aorta, mostly the infrarenal portion. There is eccentric plaque with a component of thrombus along the right aspect of the infrarenal aorta extending to the bifurcation. No significant narrowing of the abdominal aorta. No aneurysm. No dissection. Celiac: Patent without evidence of aneurysm, dissection, vasculitis or significant stenosis. SMA: There is low attenuation material extending for 2.2 cm from the origin of the superior mesenteric artery distally. This causes significant narrowing of the vessel, 70-90%. This is likely thrombus. Distal to this, the vessel is widely patent as are its branch vessels. Renals: Single right and 2 left renal arteries are widely patent. IMA: Some plaque is evident at the origin of the inferior mesenteric artery with at least mild narrowing. The vessel is patent. Inflow: Mild atherosclerotic disease noted along the common iliac arteries without significant stenosis. External iliac vessels and common femoral arteries are widely patent. Veins: No obvious venous abnormality within the limitations of this arterial phase study.  Review of the MIP images confirms the above findings. NON-VASCULAR Hepatobiliary: Liver is unremarkable. Gallbladder surgically absent. Common bile duct dilated 9 mm with distal tapering. Pancreas: Unremarkable. No pancreatic ductal dilatation or surrounding inflammatory changes. Spleen: Normal in size without focal abnormality. Adrenals/Urinary Tract: There is nodular thickening throughout the left adrenal gland most likely nodular hyperplasia. Multiple small adenomas are possible. Right adrenal gland is unremarkable. Kidneys are normal in size, orientation and position. There is symmetric enhancement. Mild bilateral renal cortical thinning. No renal masses or stones. No hydronephrosis. Normal ureters. Bladder is unremarkable. Stomach/Bowel: There is a single loop of small bowel in the central to upper pelvis with wall thickening and adjacent hazy inflammation. There is no pneumatosis intestinalis. Remainder of the small bowel is normal in caliber with normal wall thickness and no other associated mesenteric inflammation. Stomach and colon are unremarkable. Appendix not visualized. Lymphatic: No adenopathy. Reproductive: Status post hysterectomy. No adnexal masses. Other: No abdominal wall hernia.  No ascites. Musculoskeletal: No fracture or acute finding. No osteoblastic or osteolytic lesions. Review of the MIP images confirms the above findings. IMPRESSION: CTA CHEST 1. No evidence of an aortic dissection. 2. No acute findings. 3. Mild coronary artery calcifications. CTA ABDOMEN AND PELVIS 1. 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 2. A short segment of small bowel shows wall thickening and adjacent inflammation. This lies in the pelvis. Given the findings in the superior mesenteric artery, focal small bowel ischemia is suspected. This could, alternatively, be inflammatory or infectious in etiology. No pneumatosis intestinalis or  portal venous air. 3. No other significant vascular stenosis. 4. 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 No aneurysm or dissection. Critical Value/emergent results were called by telephone at the time of interpretation on 06/19/2017 at 6:03 pm to Dr. Carrie Mew , who verbally acknowledged these results. Electronically Signed   By: Lajean Manes M.D.   On: 06/19/2017 18:03    ECG & Cardiac Imaging    RSR, 89, no acute st/t changes _____________    2D Echocardiogram 8.29.2018  Study Conclusions  - Left ventricle: The cavity size was normal. There was mild  concentric hypertrophy. Systolic function was normal. The   estimated ejection fraction was in the range of 60% to 65%. Wall   motion was normal; there were no regional wall motion   abnormalities. Doppler parameters are consistent with abnormal   left ventricular relaxation (grade 1 diastolic dysfunction). - Left atrium: The atrium was normal in size. - Right ventricle: Systolic function was normal. - Pulmonary arteries: Systolic pressure was within the normal   range.  Impressions:  - No cardiac source of emboli was indentified. _____________   Assessment & Plan    1.  SMA thrombus/embolus:  Pt presented 8/27 with a 5-6 day h/o progressive abd pain despite stool softeners and laxatives.  She was found to have an SMA thrombus vs embolus and subsequently underwent catheter directed tPA administration, thrombectomy, PTA and stenting on 8/28.  She continues to have mild to moderate, diffuse abd discomfort and is being evaluated closely by [facility] for possible need for ex lap.  In the setting of concern for SMA embolus with h/o palpitations, we have been asked to evaluate for need for TEE.  TTE has been performed and showed nl EF w/o obvious source for embolus.  I discussed the role of TEE with Carla [name] today.  After careful review of history and examination,  the risks and benefits of transesophageal echocardiogram have been explained including risks of esophageal damage, perforation (1:10,000 risk), bleeding, pharyngeal hematoma as well as other potential complications associated with conscious sedation including aspiration, arrhythmia, respiratory failure and death. Alternatives to treatment were discussed, questions were answered. Patient is willing to proceed.  As such, I will arrange for tomorrow morning.  2.  Atypical Chest Pain:  Pt reports a several year h/o intermittent, sharp, chest pain occurring ~ once every 1-2 months w/o associated Ss, lasting days @ a time, worsened by deep breathing and certain position changes.  Trop has been nl here.  Echo shows nl EF w/o WMA.  She previously underwent stress testing in 08/2012 in the setting of atypical chest pain.  CTA this admission did show mild coronary artery Ca2+.  We can consider outpt stress testing but given atypical Ss and absence of objective evidence for ischemia at this time, she will not need an ischemic eval prior to proceeding with ex lap, if necessary.  Cont  blocker and statin rx.  3.  DOE:  Pt with progressive DOE over the past year.  She says activity is limited by OA, and she is deconditioned.  As above, echo w/ nl LV fxn.  She does have gr1 DD.  She is euvolemic today.  HR/BP stable.  As above, we will consider outpt stress testing.  4.  Tachypalpitations:  Pt with a h/o tachypalps occurring about 1x/month, associated with dyspnea, lasting about 30 mins, and resolving spontaneously.  No symptoms since admission and no events on tele.  Cont to follow tele.  If no significant arrhythmias noted, we will arrange for outpt 30 day event monitoring, esp in light of concern for possible Afib in the setting of SMA embolus.  Cont  blocker.  5.  Essential HTN:  Stable.  6.  HL:  LDL of 218 on 8/22.  Cont high potency statin therapy.   Murray Hodgkins, NP  06/21/2017 2:24 PM     Signed, Murray Hodgkins, NP 06/21/2017, 2:22 PM

## 2017-06-22 ENCOUNTER — Encounter: Payer: Self-pay | Admitting: Cardiovascular Disease

## 2017-06-22 ENCOUNTER — Telehealth: Payer: Self-pay

## 2017-06-22 ENCOUNTER — Other Ambulatory Visit: Payer: Self-pay | Admitting: Nurse Practitioner

## 2017-06-22 ENCOUNTER — Inpatient Hospital Stay (HOSPITAL_COMMUNITY)
Admit: 2017-06-22 | Discharge: 2017-06-22 | Disposition: A | Discharge status: Home / Self Care | Payer: Medicaid Other | Attending: Nurse Practitioner | Admitting: Nurse Practitioner

## 2017-06-22 ENCOUNTER — Encounter
Admission: EM | Disposition: A | Discharge status: Home / Self Care | Payer: Self-pay | Source: Home / Self Care | Attending: Surgery

## 2017-06-22 DIAGNOSIS — R002 Palpitations: Secondary | ICD-10-CM

## 2017-06-22 DIAGNOSIS — K55069 Acute infarction of intestine, part and extent unspecified: Secondary | ICD-10-CM

## 2017-06-22 HISTORY — PX: TEE WITHOUT CARDIOVERSION: SHX5443

## 2017-06-22 LAB — BASIC METABOLIC PANEL
Anion gap: 7 (ref 5–15)
BUN: 5 mg/dL — ABNORMAL LOW (ref 6–20)
CO2: 26 mmol/L (ref 22–32)
Calcium: 8 mg/dL — ABNORMAL LOW (ref 8.9–10.3)
Chloride: 108 mmol/L (ref 101–111)
Creatinine, Ser: 0.66 mg/dL (ref 0.44–1.00)
GFR calc Af Amer: >60 mL/min (ref >60)
GFR calc non Af Amer: >60 mL/min (ref >60)
Glucose, Bld: 108 mg/dL — ABNORMAL HIGH (ref 65–99)
Potassium: 3.3 mmol/L — ABNORMAL LOW (ref 3.5–5.1)
Sodium: 141 mmol/L (ref 135–145)

## 2017-06-22 LAB — CBC
HCT: 28.7 % — ABNORMAL LOW (ref 35.0–47.0)
Hemoglobin: 9.7 g/dL — ABNORMAL LOW (ref 12.0–16.0)
MCH: 30.1 pg (ref 26.0–34.0)
MCHC: 33.8 g/dL (ref 32.0–36.0)
MCV: 89.1 fL (ref 80.0–100.0)
Platelets: 240 10*3/uL (ref 150–440)
RBC: 3.23 MIL/uL — ABNORMAL LOW (ref 3.80–5.20)
RDW: 14.2 % (ref 11.5–14.5)
WBC: 7.8 10*3/uL (ref 3.6–11.0)

## 2017-06-22 LAB — MAGNESIUM: Magnesium: 2 mg/dL (ref 1.7–2.4)

## 2017-06-22 LAB — HEPARIN LEVEL (UNFRACTIONATED): Heparin Unfractionated: 0.34 IU/mL (ref 0.30–0.70)

## 2017-06-22 SURGERY — ECHOCARDIOGRAM, TRANSESOPHAGEAL
Anesthesia: Choice

## 2017-06-22 MED ORDER — BOOST / RESOURCE BREEZE PO LIQD
1.0000 | Freq: Three times a day (TID) | ORAL | Status: DC
  Administered 2017-06-22 – 2017-06-23 (×2): 1 via ORAL

## 2017-06-22 MED ORDER — FENTANYL CITRATE (PF) 100 MCG/2ML IJ SOLN
INTRAMUSCULAR | Status: AC
  Filled 2017-06-22: qty 2

## 2017-06-22 MED ORDER — MIDAZOLAM HCL 5 MG/5ML IJ SOLN
INTRAMUSCULAR | Status: AC
  Administered 2017-06-22: 2 mg
  Filled 2017-06-22: qty 5

## 2017-06-22 MED ORDER — POTASSIUM CHLORIDE CRYS ER 20 MEQ PO TBCR: 40.0000 meq | EXTENDED_RELEASE_TABLET | Freq: Once | ORAL | Status: AC

## 2017-06-22 MED ORDER — POTASSIUM CHLORIDE 10 MEQ/100ML IV SOLN
10.0000 meq | INTRAVENOUS | Status: DC
  Administered 2017-06-22 (×2): 10 meq via INTRAVENOUS
  Filled 2017-06-22 (×4): qty 100

## 2017-06-22 MED ORDER — BUTAMBEN-TETRACAINE-BENZOCAINE 2-2-14 % EX AERO
INHALATION_SPRAY | CUTANEOUS | Status: AC
  Filled 2017-06-22: qty 20

## 2017-06-22 MED ORDER — LIDOCAINE VISCOUS 2 % MT SOLN
OROMUCOSAL | Status: AC
  Filled 2017-06-22: qty 15

## 2017-06-22 MED ORDER — POTASSIUM CHLORIDE 20 MEQ PO PACK
40.0000 meq | PACK | Freq: Once | ORAL | Status: AC
  Administered 2017-06-22: 40 meq via ORAL
  Filled 2017-06-22: qty 2

## 2017-06-22 NOTE — Telephone Encounter (Signed)
-----   Message from Stana Bunting, RN sent at 06/22/2017  9:45 AM EDT ----- [name] taken care of the 30 day monitor, can you make the appt w/ Arida happen?  ----- Message ----- From: Rogelia Mire, NP Sent: 06/22/2017   8:32 AM To: [facility] Triage  Good morning,  I placed an order for a 30 day event monitor for [name] - palpitations w/ SMA embolus - concern for Afib.  She'll prob go home over the weekend or early next week and will need monitor mailed to her and then f/u with Arida in about 6 wks.  Thanks,  Gerald Stabs

## 2017-06-22 NOTE — Telephone Encounter (Signed)
Attempted to enter pt's info into [employer] for 30 day monitor. Pt's address is listed as a PO Box, and [employer] will only ship to a physical address. Left message for pt to call back w/ an address to ship monitor to.

## 2017-06-22 NOTE — CV Procedure (Signed)
TEE was performed without complications. It showed normal LV systolic function with no evidence of intracardiac thrombus or PFO. No cardiac source of embolism. Full report to follow.

## 2017-06-22 NOTE — Progress Notes (Signed)
*  PRELIMINARY RESULTS* Echocardiogram Echocardiogram Transesophageal has been performed.  Carla [name] 06/22/2017, 8:11 AM

## 2017-06-22 NOTE — Progress Notes (Signed)
ANTICOAGULATION CONSULT NOTE - Follow Up Consult  Pharmacy Consult for Heparin  Indication: mesenteric ischemia  No Known Allergies  Patient Measurements: Height: 5\' 2"  (157.5 cm) Weight: 225 lb (102.1 kg) IBW/kg (Calculated) : 50.1 Heparin Dosing Weight: 74.7 kg   Vital Signs: Temp: 97.7 F (36.5 C) (08/31 0900) Temp Source: Oral (08/31 0900) BP: 109/77 (08/31 1403) Pulse Rate: 67 (08/31 1403)  Labs:  Recent Labs  06/19/17 2007  06/20/17 7342  06/21/17 0256 06/21/17 0923 06/21/17 2035 06/22/17 0252  HGB  --   < > 12.3  --  10.5*  --   --  9.7*  HCT  --   --  36.1  --  30.6*  --   --  28.7*  PLT  --   --  240  --  229  --   --  240  APTT 103*  --   --   --   --   --   --   --   LABPROT 14.6  --   --   --   --   --   --   --   INR 1.15  --   --   --   --   --   --   --   HEPARINUNFRC  --   --  0.28*  < >  --  0.28* 0.47 0.34  CREATININE  --   --  0.63  --  0.67  --   --  0.66  < > = values in this interval not displayed.  Estimated Creatinine Clearance: 86.8 mL/min (by C-G formula based on SCr of 0.66 mg/dL).   Medications:  Prescriptions Prior to Admission  Medication Sig Dispense Refill Last Dose  . aspirin EC 81 MG tablet Take 81 mg by mouth daily.   06/18/2017 at 1200  . atorvastatin (LIPITOR) 80 MG tablet Take 80 mg by mouth at bedtime.   06/16/2017 at pm  . gabapentin (NEURONTIN) 300 MG capsule Take 300 mg by mouth 2 (two) times daily.   06/05/2017 at unknown  . lisinopril (PRINIVIL,ZESTRIL) 40 MG tablet Take 40 mg by mouth daily.   06/18/2017 at pm  . metoprolol tartrate (LOPRESSOR) 25 MG tablet Take 25 mg by mouth 2 (two) times daily.   06/18/2017 at 1200  . omeprazole (PRILOSEC) 20 MG capsule Take 20 mg by mouth 2 (two) times daily.   06/05/2017 at unknown  . traMADol (ULTRAM) 50 MG tablet Take 50 mg by mouth every 6 (six) hours as needed for moderate pain.    PRN at PRN    Assessment: 56 y/o F admitted with ischemic bowel and hypertensive urgency. 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 Patient's heparin is currently infusing at 1,500 units/hr.   Goal of Therapy:  Heparin level 0.3-0.7 units/ml Monitor platelets by anticoagulation protocol: Yes   Plan:  Will continue heparin infusion at 1500 unit/hr and f/u AM labs.   Pharmacy will continue to monitor and adjust per consult.    Ulice Dash D 06/22/2017,2:05 PM

## 2017-06-22 NOTE — Progress Notes (Signed)
Pt is alert and oriented. Pt achieving 1500 on incentive spirometry. Pt remains on RA and on Heparin drip at 1500 units and hour. Report given to [place], Therapist, sports. Full assessment in EPIC.

## 2017-06-22 NOTE — Progress Notes (Signed)
[facility] [place] NAME: Carla [name]    MR#:  [id number]  DATE OF BIRTH:  06-09-1961  SUBJECTIVE:  CHIEF COMPLAINT:   Chief Complaint  Patient presents with  . Chest Pain   better abdominal pain and nausea, on heparin drip. REVIEW OF SYSTEMS:  Review of Systems  Constitutional: Negative for chills, fever and malaise/fatigue.  HENT: Negative for sore throat.   Eyes: Negative for blurred vision and double vision.  Respiratory: Negative for cough, hemoptysis, shortness of breath, wheezing and stridor.   Cardiovascular: Negative for chest pain, palpitations, orthopnea and leg swelling.  Gastrointestinal: Positive for abdominal pain. Negative for blood in stool, diarrhea, melena, nausea and vomiting.  Genitourinary: Negative for dysuria, flank pain and hematuria.  Musculoskeletal: Negative for back pain and joint pain.  Skin: Negative for rash.  Neurological: Negative for dizziness, sensory change, focal weakness, seizures, loss of consciousness, weakness and headaches.  Endo/Heme/Allergies: Negative for polydipsia.  Psychiatric/Behavioral: Negative for depression. The patient is not nervous/anxious.     DRUG ALLERGIES:  No Known Allergies VITALS:  Blood pressure 109/77, pulse 67, temperature 97.7 F (36.5 C), temperature source Oral, resp. rate (!) 26, height 5\' 2"  (1.575 m), weight 225 lb (102.1 kg), SpO2 100 %. PHYSICAL EXAMINATION:  Physical Exam  Constitutional: She is oriented to person, place, and time and well-developed, well-nourished, and in no distress.  obese  HENT:  Head: Normocephalic.  Mouth/Throat: Oropharynx is clear and moist.  Eyes: Pupils are equal, round, and reactive to light. Conjunctivae and EOM are normal. No scleral icterus.  Neck: Normal range of motion. Neck supple. No JVD present. No tracheal deviation present.  Cardiovascular: Normal rate, regular rhythm and normal heart sounds.  Exam reveals no  gallop.   No murmur heard. Pulmonary/Chest: Effort normal and breath sounds normal. No respiratory distress. She has no wheezes. She has no rales.  Abdominal: Soft. Bowel sounds are normal. She exhibits no distension. There is tenderness. There is no rebound.  Diffuse tenderness  Musculoskeletal: Normal range of motion. She exhibits no edema or tenderness.  Neurological: She is alert and oriented to person, place, and time. No cranial nerve deficit.  Skin: No rash noted. No erythema.  Psychiatric: Affect normal.   LABORATORY PANEL:  Female CBC  Recent Labs Lab 06/22/17 0252  WBC 7.8  HGB 9.7*  HCT 28.7*  PLT 240   ------------------------------------------------------------------------------------------------------------------ Chemistries   Recent Labs Lab 06/18/17 1954  06/22/17 0252  NA 136  < > 141  K 3.4*  < > 3.3*  CL 98*  < > 108  CO2 26  < > 26  GLUCOSE 167*  < > 108*  BUN 16  < > 5*  CREATININE 1.36*  < > 0.66  CALCIUM 9.6  < > 8.0*  MG  --   < > 2.0  AST 23  --   --   ALT 38  --   --   ALKPHOS 135*  --   --   BILITOT 0.6  --   --   < > = values in this interval not displayed. RADIOLOGY:  No results found. ASSESSMENT AND PLAN:   Carla [name] is an 56 y.o. female. With past medical history of GERD, hypertension, hyperlipidemia and morbid obesity came into the ED with a chief complaint of generalized abdominal pain for one week which has been worse for 3 days associated with nausea. Denies any hematemesis. Patient is admitted to  surgery for ischemic bowel and hospitalist team is consulted for elevated blood pressure and palpitations.   # hypertensive urgency -from uncontrolled abdominal pain  Pain control. On IV labetalol as needed, BP is low side.hold Lopressor  #Palpitations-intermittent, resolved. HR is normal. Per cardiology consult,all available 12-lead EKGs show sinus rhythm. Echo: No cardiac source of emboli was indentified. EF  60%.  #hypokalemia given potassium and improved. Magnesium level is normal. K is 3.3 today, give KCl. Follow-up BMP.  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 POD #2 s/p SMA thrombectomy, thrombolysis, and covered stent placement Continue heparin drip, consider cardiac origin even with a normal echocardiogram. Consideration for transesophageal echocardiogram could be given, but even if negative I would treat this like a cardiac embolus and maintain anticoagulation and consider outpatient telemetry monitoring per Dr. Lucky Cowboy. TEE: No cardiac source of emboli was indentified.  Restarted liquid diet but she should stay in the ICU for continued monitoring at this point and remain on heparin per Dr. Burt Knack.  Hyperlipidemia. Continue statin therapy Obesity. Tobacco abuse. Smoking cessation was counseled.  The patient's blood pressure is controlled, echocardiograph is unremarkable and cardiology on board. Sign off. All the records are reviewed and case discussed with Care Management/Social Worker. Management plans discussed with the patient, family and they are in agreement.  CODE STATUS: Full Code  TOTAL TIME TAKING CARE OF THIS PATIENT: 33 minutes.   More than 50% of the time was spent in counseling/coordination of care: YES  POSSIBLE D/C IN ? DAYS, DEPENDING ON CLINICAL CONDITION.   Demetrios Loll M.D on 06/22/2017 at 4:38 PM  Between 7am to 6pm - Pager - [phone]  After 6pm go to [email or web address] - [facility]

## 2017-06-22 NOTE — Telephone Encounter (Signed)
L mom to call and schedule f/u in 6 weeks with Dr. Fletcher Anon

## 2017-06-22 NOTE — Progress Notes (Signed)
Nutrition Follow-up  DOCUMENTATION CODES:   Morbid obesity  INTERVENTION:  1. Boost Breeze po TID, each supplement provides 250 kcal and 9 grams of protein 2. Monitor for diet advancement vs need for TPN  NUTRITION DIAGNOSIS:   Inadequate oral intake related to poor appetite, nausea (abdominal pain) as evidenced by per patient/family report. -ongoing  GOAL:   Patient will meet greater than or equal to 90% of their needs -not meeting  MONITOR:   Diet advancement, Labs, Weight trends, I & O's  REASON FOR ASSESSMENT:   Malnutrition Screening Tool    ASSESSMENT:   56 year old female with PMHx of HTN, HLD, GERD, Bell's palsy who presented with one week history of abdominal pain that had progressed to severe pain over the last 2 days, also with nausea and poor appetite. 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  Spoke with Dr. Burt Knack about patient, still unsure of the cause of her pain and symptoms, will continue to monitor. May need to go to the OR at some point. Boost breeze ordered. She is very hungry right now. Consumed 100% of her liquids this morning. Asking for Ensure.   Intake/Output Summary (Last 24 hours) at 06/22/17 1111 Last data filed at 06/22/17 1000  Gross per 24 hour  Intake          5247.95 ml  Output             1350 ml  Net          3897.95 ml  8L Fluid Positive Weight stable  Labs reviewed:  K 3.3 Medications reviewed and include:  Morphine PCA, D5LR at 122mL/hr --> 510 calories,  Meal Completion: 100%  Diet Order:  Diet clear liquid Room service appropriate? Yes; Fluid consistency: Thin  Skin:  Reviewed, no issues  Last BM:  PTA (06/17/2017 per chart)  Height:   Ht Readings from Last 1 Encounters:  06/22/17 5\' 2"  (1.575 m)    Weight:   Wt Readings from Last 1 Encounters:  06/22/17 225 lb (102.1 kg)    Ideal Body Weight:  50 kg  BMI:  Body mass index is 41.15 kg/m.  Estimated Nutritional Needs:    Kcal:  1880-2190 (MSJ x 1.2-1.4)  Protein:  92-112 grams (0.9-1.1 grams/kg)  Fluid:  1.5-1.75 L/day (30-35 ml/kg IBW)  EDUCATION NEEDS:   No education needs identified at this time  Satira Anis. Donivan Thammavong, MS, RD LDN Inpatient Clinical Dietitian Pager [phone]

## 2017-06-22 NOTE — Progress Notes (Signed)
Late entry: Patient was seen earlier this afternoon.  No changes to complaints of abdominal pain at this time  Vital signs are reviewed and stable No real change to abdominal exam. No peritoneal signs  Patient is tolerating a clear liquid diet continue current therapy including heparin drip. May be able to move to the floor tomorrow.

## 2017-06-22 NOTE — Progress Notes (Signed)
Pharmacy Consult for Electrolyte Monitoring Indication: Hypokalemia  No Known Allergies  Patient Measurements: Height: 5\' 2"  (157.5 cm) Weight: 225 lb (102.1 kg) IBW/kg (Calculated) : 50.1  Vital Signs: Temp: 97.7 F (36.5 C) (08/31 0900) Temp Source: Oral (08/31 0900) BP: 109/77 (08/31 1403) Pulse Rate: 67 (08/31 1403) Intake/Output from previous day: 08/30 0701 - 08/31 0700 In: 4168 [P.O.:1080; I.V.:2788; IV Piggyback:300] Out: 1350 [FYBOF:7510] Intake/Output from this shift: Total I/O In: 1080 [I.V.:980; IV Piggyback:100] Out: 0   Labs:  Recent Labs  06/19/17 2007 06/20/17 0209 06/20/17 1827 06/21/17 0256 06/22/17 0252  WBC  --  13.5*  --  10.7 7.8  HGB  --  12.3  --  10.5* 9.7*  HCT  --  36.1  --  30.6* 28.7*  PLT  --  240  --  229 240  APTT 103*  --   --   --   --   CREATININE  --  0.63  --  0.67 0.66  MG 1.7  --  2.4  --  2.0   Estimated Creatinine Clearance: 86.8 mL/min (by C-G formula based on SCr of 0.66 mg/dL).  Sodium (mmol/L)  Date Value  06/22/2017 141  02/12/2015 140   Potassium (mmol/L)  Date Value  06/22/2017 3.3 (L)  02/12/2015 2.9 (L)   Calcium (mg/dL)  Date Value  06/22/2017 8.0 (L)   Calcium, Total (mg/dL)  Date Value  02/12/2015 9.0    Assessment: Pharmacy consulted to assist in managing electrolytes in this 56 y/o F with mesenteric ischemia.   Plan:  Diet initiated with feeding supplement that should help to maintain normal K level. K replaced this AM with oral and IV supplementation. Will f/u AM labs.   Pharmacy will continue to monitor and adjust per consult.   Ulice Dash D 06/22/2017,2:59 PM

## 2017-06-22 NOTE — Progress Notes (Signed)
Stop by the ICU earlier today and the patient was downstairs having her TEE performed. Her condition was discussed with the nurse at that time. She is using her PCA.  CC: SMA thrombosis Subjective: This patient with SMA thrombosis. She is in the ICU. She states that her pain is better today again showing improvement over yesterday but she is using her PCA periodically. The RN who I discussed this with question whether or not her pain threshold was abnormal in that she does not believe she was having as much pain as she is complaining of. But overall the patient states she's feeling better. She is also tolerating clear liquid diet.  Objective: Vital signs in last 24 hours: Temp:  [97.7 F (36.5 C)-98.8 F (37.1 C)] 97.7 F (36.5 C) (08/31 0725) Pulse Rate:  [56-85] 62 (08/31 0801) Resp:  [11-32] 18 (08/31 0815) BP: (81-129)/(56-87) 101/70 (08/31 0815) SpO2:  [93 %-100 %] 96 % (08/31 0815) Weight:  [225 lb (102.1 kg)] 225 lb (102.1 kg) (08/31 0725) Last BM Date: 06/17/17  Intake/Output from previous day: 08/30 0701 - 08/31 0700 In: 4168 [P.O.:1080; I.V.:2788; IV Piggyback:300] Out: 1350 [Urine:1350] Intake/Output this shift: No intake/output data recorded.  Physical exam:  Abdominal exam is essentially unchanged but somewhat less tender. Certainly no peritoneal signs are noted. Vital signs reviewed and stable. Calves are nontender. No icterus no jaundice  Lab Results: CBC   Recent Labs  06/21/17 0256 06/22/17 0252  WBC 10.7 7.8  HGB 10.5* 9.7*  HCT 30.6* 28.7*  PLT 229 240   BMET  Recent Labs  06/21/17 0256 06/22/17 0252  NA 140 141  K 3.5 3.3*  CL 106 108  CO2 27 26  GLUCOSE 117* 108*  BUN 6 5*  CREATININE 0.67 0.66  CALCIUM 7.9* 8.0*   PT/INR  Recent Labs  06/19/17 2007  LABPROT 14.6  INR 1.15   ABG No results for input(s): PHART, HCO3 in the last 72 hours.  Invalid input(s): PCO2, PO2  Studies/Results: No results  found.  Anti-infectives: Anti-infectives    Start     Dose/Rate Route Frequency Ordered Stop   06/20/17 1100  cefUROXime (ZINACEF) 1.5 g in dextrose 5 % 50 mL IVPB     1.5 g 100 mL/hr over 30 Minutes Intravenous 30 min pre-op 06/20/17 1100 06/20/17 1625   06/20/17 0230  piperacillin-tazobactam (ZOSYN) IVPB 3.375 g     3.375 g 12.5 mL/hr over 240 Minutes Intravenous Every 8 hours 06/20/17 0143     06/20/17 0000  piperacillin-tazobactam (ZOSYN) IVPB 3.375 g  Status:  Discontinued     3.375 g 12.5 mL/hr over 240 Minutes Intravenous Every 8 hours 06/19/17 2000 06/20/17 0143   06/19/17 2015  piperacillin-tazobactam (ZOSYN) IVPB 3.375 g     3.375 g 100 mL/hr over 30 Minutes Intravenous  Once 06/19/17 2000 06/19/17 2301   06/19/17 1930  piperacillin-tazobactam (ZOSYN) IVPB 3.375 g  Status:  Discontinued     3.375 g 12.5 mL/hr over 240 Minutes Intravenous Every 6 hours 06/19/17 1924 06/19/17 2000      Assessment/Plan:  This patient with a perplexing problem in that she has had SMA thrombosis area of the etiology of this is still unclear but believed to be cardiac in nature. Of importance is the fact that she continues to have considerable pain and is using a PCA but since this has been going on for several days approaching a week she has not shown any signs of bowel necrosis. She has not  shown signs of sepsis. Her vital signs are been normal her white blood cell count is now normal her bicarbonate and lactate levels are normal and her exam has not shown any signs of peritoneal irritation. One would expect at least at this point that frank perforation would have occurred.  I'm still concerned this patient may require surgery but her pain threshold may be altered as the RN had suggested but the fact that she is now at least 7 days from the onset of her symptoms one would expect that with revascularization any sort of bowel necrosis, anti-mesenteric necrosis, would have presented itself by now in the  form of florid sepsis and frank leakage.  She's been restarted on her clear liquids which she is tolerating after the TEE but I think she should stay in the ICU for continued monitoring at this point and remain on heparin.  Florene Glen, MD, FACS  06/22/2017

## 2017-06-22 NOTE — Telephone Encounter (Signed)
-----   Message from Rogelia Mire, NP sent at 06/22/2017  8:32 AM EDT ----- Good morning,  I placed an order for a 30 day event monitor for [name] - palpitations w/ SMA embolus - concern for Afib.  She'll prob go home over the weekend or early next week and will need monitor mailed to her and then f/u with Arida in about 6 wks.  Thanks,  Gerald Stabs

## 2017-06-22 NOTE — Sedation Documentation (Signed)
TEE total sedation: Versed 2 mg IV. Pt. Tolerated procedure well.

## 2017-06-23 LAB — BASIC METABOLIC PANEL
Anion gap: 7 (ref 5–15)
BUN: <5 mg/dL — ABNORMAL LOW (ref 6–20)
CO2: 28 mmol/L (ref 22–32)
Calcium: 8.4 mg/dL — ABNORMAL LOW (ref 8.9–10.3)
Chloride: 108 mmol/L (ref 101–111)
Creatinine, Ser: 0.67 mg/dL (ref 0.44–1.00)
GFR calc Af Amer: >60 mL/min (ref >60)
GFR calc non Af Amer: >60 mL/min (ref >60)
Glucose, Bld: 110 mg/dL — ABNORMAL HIGH (ref 65–99)
Potassium: 4 mmol/L (ref 3.5–5.1)
Sodium: 143 mmol/L (ref 135–145)

## 2017-06-23 LAB — CBC
HCT: 30.2 % — ABNORMAL LOW (ref 35.0–47.0)
Hemoglobin: 10.2 g/dL — ABNORMAL LOW (ref 12.0–16.0)
MCH: 30.4 pg (ref 26.0–34.0)
MCHC: 33.8 g/dL (ref 32.0–36.0)
MCV: 89.8 fL (ref 80.0–100.0)
Platelets: 261 10*3/uL (ref 150–440)
RBC: 3.37 MIL/uL — ABNORMAL LOW (ref 3.80–5.20)
RDW: 14.4 % (ref 11.5–14.5)
WBC: 5.1 10*3/uL (ref 3.6–11.0)

## 2017-06-23 LAB — HEPARIN LEVEL (UNFRACTIONATED): Heparin Unfractionated: 0.46 IU/mL (ref 0.30–0.70)

## 2017-06-23 LAB — MAGNESIUM: Magnesium: 1.8 mg/dL (ref 1.7–2.4)

## 2017-06-23 MED ORDER — ENSURE ENLIVE PO LIQD
237.0000 mL | Freq: Two times a day (BID) | ORAL | Status: DC
  Administered 2017-06-23 – 2017-06-28 (×10): 237 mL via ORAL

## 2017-06-23 NOTE — Progress Notes (Signed)
Report called to Ledell Noss, RN on [place]. No questions currently. Patient transported on telemetry. Patient updated on plan of care. Elink and CCMD notified of transfer. Wilnette Kales

## 2017-06-23 NOTE — Progress Notes (Signed)
Pharmacy Consult for Electrolyte Monitoring Indication: Hypokalemia  No Known Allergies  Patient Measurements: Height: 5\' 2"  (157.5 cm) Weight: 225 lb (102.1 kg) IBW/kg (Calculated) : 50.1  Vital Signs: Temp: 98.3 F (36.8 C) (09/01 0400) Temp Source: Oral (09/01 0400) BP: 145/75 (09/01 0907) Pulse Rate: 67 (09/01 0907) Intake/Output from previous day: 08/31 0701 - 09/01 0700 In: 1230 [I.V.:980; IV Piggyback:250] Out: 1850 [Urine:1850] Intake/Output from this shift: Total I/O In: 3220 [I.V.:3220] Out: -   Labs:  Recent Labs  06/20/17 1827 06/21/17 0256 06/22/17 0252 06/23/17 0523  WBC  --  10.7 7.8 5.1  HGB  --  10.5* 9.7* 10.2*  HCT  --  30.6* 28.7* 30.2*  PLT  --  229 240 261  CREATININE  --  0.67 0.66 0.67  MG 2.4  --  2.0 1.8   Estimated Creatinine Clearance: 86.8 mL/min (by C-G formula based on SCr of 0.67 mg/dL).  Sodium (mmol/L)  Date Value  06/23/2017 143  02/12/2015 140   Potassium (mmol/L)  Date Value  06/23/2017 4.0  02/12/2015 2.9 (L)   Calcium (mg/dL)  Date Value  06/23/2017 8.4 (L)   Calcium, Total (mg/dL)  Date Value  02/12/2015 9.0    Assessment: Pharmacy consulted to assist in managing electrolytes in this 56 y/o F with mesenteric ischemia.   Plan:  8/31: Diet initiated with feeding supplement that should help to maintain normal K level. K replaced this AM with oral and IV supplementation. Will f/u AM labs.   9/1: K 4.0, Mag 1.8 - WNL no supplementation needed at this time. Will f/u AM labs.   Pharmacy will continue to monitor and adjust per consult.   Rocky Morel 06/23/2017,9:37 AM

## 2017-06-23 NOTE — Progress Notes (Signed)
CC: Mesenteric thrombosis Subjective: This a patient who is status post revascularization following a thrombus in these SMA. Today she feels much better her pain is much improved and she is hungry she has been tolerating a clear liquid diet she has no nausea vomiting fevers or chills she has not had a bowel movement yet.  Objective: Vital signs in last 24 hours: Temp:  [97.7 F (36.5 C)-98.3 F (36.8 C)] 98.3 F (36.8 C) (09/01 0400) Pulse Rate:  [57-78] 62 (09/01 0800) Resp:  [9-26] 14 (09/01 0840) BP: (108-167)/(73-107) 155/107 (09/01 0800) SpO2:  [89 %-100 %] 97 % (09/01 0840) Last BM Date: 06/17/17  Intake/Output from previous day: 08/31 0701 - 09/01 0700 In: 1230 [I.V.:980; IV Piggyback:250] Out: 1850 [Urine:1850] Intake/Output this shift: No intake/output data recorded.  Physical exam:  Vital signs are stable and afebrile Awake alert and oriented Abdomen is soft nondistended much less tender no peritoneal signs Calves are nontender No icterus No jaundice  Lab Results: CBC   Recent Labs  06/22/17 0252 06/23/17 0523  WBC 7.8 5.1  HGB 9.7* 10.2*  HCT 28.7* 30.2*  PLT 240 261   BMET  Recent Labs  06/22/17 0252 06/23/17 0523  NA 141 143  K 3.3* 4.0  CL 108 108  CO2 26 28  GLUCOSE 108* 110*  BUN 5* <5*  CREATININE 0.66 0.67  CALCIUM 8.0* 8.4*   PT/INR No results for input(s): LABPROT, INR in the last 72 hours. ABG No results for input(s): PHART, HCO3 in the last 72 hours.  Invalid input(s): PCO2, PO2  Studies/Results: No results found.  Anti-infectives: Anti-infectives    Start     Dose/Rate Route Frequency Ordered Stop   06/20/17 1100  cefUROXime (ZINACEF) 1.5 g in dextrose 5 % 50 mL IVPB     1.5 g 100 mL/hr over 30 Minutes Intravenous 30 min pre-op 06/20/17 1100 06/20/17 1625   06/20/17 0230  piperacillin-tazobactam (ZOSYN) IVPB 3.375 g     3.375 g 12.5 mL/hr over 240 Minutes Intravenous Every 8 hours 06/20/17 0143     06/20/17 0000   piperacillin-tazobactam (ZOSYN) IVPB 3.375 g  Status:  Discontinued     3.375 g 12.5 mL/hr over 240 Minutes Intravenous Every 8 hours 06/19/17 2000 06/20/17 0143   06/19/17 2015  piperacillin-tazobactam (ZOSYN) IVPB 3.375 g     3.375 g 100 mL/hr over 30 Minutes Intravenous  Once 06/19/17 2000 06/19/17 2301   06/19/17 1930  piperacillin-tazobactam (ZOSYN) IVPB 3.375 g  Status:  Discontinued     3.375 g 12.5 mL/hr over 240 Minutes Intravenous Every 6 hours 06/19/17 1924 06/19/17 2000      Assessment/Plan:  All labs are personally reviewed. Patient is doing quite well will advance diet and moved to the floor. I suspect that her long-term anticoagulation can be started and transitioned off of heparin at any time but I will leave that up to the internal medicine team. No sign of bowel ischemia at this time.  Florene Glen, MD, FACS  06/23/2017

## 2017-06-23 NOTE — Progress Notes (Signed)
ANTICOAGULATION CONSULT NOTE - Follow Up Consult  Pharmacy Consult for Heparin  Indication: mesenteric ischemia  No Known Allergies  Patient Measurements: Height: 5\' 2"  (157.5 cm) Weight: 225 lb (102.1 kg) IBW/kg (Calculated) : 50.1 Heparin Dosing Weight: 74.7 kg   Vital Signs: Temp: 98.3 F (36.8 C) (09/01 0400) Temp Source: Oral (09/01 0400) BP: 125/81 (09/01 0500) Pulse Rate: 57 (09/01 0500)  Labs:  Recent Labs  06/21/17 0256  06/21/17 2035 06/22/17 0252 06/23/17 0523  HGB 10.5*  --   --  9.7* 10.2*  HCT 30.6*  --   --  28.7* 30.2*  PLT 229  --   --  240 261  HEPARINUNFRC  --   < > 0.47 0.34 0.46  CREATININE 0.67  --   --  0.66 0.67  < > = values in this interval not displayed.  Estimated Creatinine Clearance: 86.8 mL/min (by C-G formula based on SCr of 0.67 mg/dL).   Medications:  Prescriptions Prior to Admission  Medication Sig Dispense Refill Last Dose  . aspirin EC 81 MG tablet Take 81 mg by mouth daily.   06/18/2017 at 1200  . atorvastatin (LIPITOR) 80 MG tablet Take 80 mg by mouth at bedtime.   06/16/2017 at pm  . gabapentin (NEURONTIN) 300 MG capsule Take 300 mg by mouth 2 (two) times daily.   06/05/2017 at unknown  . lisinopril (PRINIVIL,ZESTRIL) 40 MG tablet Take 40 mg by mouth daily.   06/18/2017 at pm  . metoprolol tartrate (LOPRESSOR) 25 MG tablet Take 25 mg by mouth 2 (two) times daily.   06/18/2017 at 1200  . omeprazole (PRILOSEC) 20 MG capsule Take 20 mg by mouth 2 (two) times daily.   06/05/2017 at unknown  . traMADol (ULTRAM) 50 MG tablet Take 50 mg by mouth every 6 (six) hours as needed for moderate pain.    PRN at PRN    Assessment: 56 y/o F admitted with ischemic bowel and hypertensive urgency. 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 Patient's heparin is currently infusing at 1,500 units/hr.   09/01 AM labs: Heparin resulted at 0.46.  Goal of Therapy:  Heparin level 0.3-0.7  units/ml Monitor platelets by anticoagulation protocol: Yes   Plan:  Will continue heparin infusion at 1500 unit/hr and f/u AM labs.   Pharmacy will continue to monitor and adjust per consult.    Paulina Fusi, PharmD, BCPS 06/23/2017 6:20 AM

## 2017-06-24 LAB — CBC
HCT: 28.9 % — ABNORMAL LOW (ref 35.0–47.0)
Hemoglobin: 9.9 g/dL — ABNORMAL LOW (ref 12.0–16.0)
MCH: 30.1 pg (ref 26.0–34.0)
MCHC: 34.2 g/dL (ref 32.0–36.0)
MCV: 88 fL (ref 80.0–100.0)
Platelets: 281 10*3/uL (ref 150–440)
RBC: 3.28 MIL/uL — ABNORMAL LOW (ref 3.80–5.20)
RDW: 14.4 % (ref 11.5–14.5)
WBC: 5.6 10*3/uL (ref 3.6–11.0)

## 2017-06-24 LAB — BASIC METABOLIC PANEL
Anion gap: 6 (ref 5–15)
BUN: 5 mg/dL — ABNORMAL LOW (ref 6–20)
CO2: 29 mmol/L (ref 22–32)
Calcium: 8.3 mg/dL — ABNORMAL LOW (ref 8.9–10.3)
Chloride: 104 mmol/L (ref 101–111)
Creatinine, Ser: 0.75 mg/dL (ref 0.44–1.00)
GFR calc Af Amer: >60 mL/min (ref >60)
GFR calc non Af Amer: >60 mL/min (ref >60)
Glucose, Bld: 130 mg/dL — ABNORMAL HIGH (ref 65–99)
Potassium: 3.7 mmol/L (ref 3.5–5.1)
Sodium: 139 mmol/L (ref 135–145)

## 2017-06-24 LAB — HEPARIN LEVEL (UNFRACTIONATED): Heparin Unfractionated: 0.46 IU/mL (ref 0.30–0.70)

## 2017-06-24 LAB — MAGNESIUM: Magnesium: 1.7 mg/dL (ref 1.7–2.4)

## 2017-06-24 MED ORDER — MAGNESIUM SULFATE 2 GM/50ML IV SOLN
2.0000 g | Freq: Once | INTRAVENOUS | Status: AC
  Administered 2017-06-24: 2 g via INTRAVENOUS
  Filled 2017-06-24: qty 50

## 2017-06-24 MED ORDER — HYDROCODONE-ACETAMINOPHEN 5-325 MG PO TABS
1.0000 | ORAL_TABLET | ORAL | Status: DC | PRN
  Administered 2017-06-24 – 2017-06-25 (×4): 1 via ORAL
  Filled 2017-06-24 (×5): qty 1

## 2017-06-24 MED ORDER — WARFARIN - PHARMACIST DOSING INPATIENT: Freq: Every day | Status: DC

## 2017-06-24 MED ORDER — WARFARIN SODIUM 7.5 MG PO TABS
7.5000 mg | ORAL_TABLET | Freq: Once | ORAL | Status: AC
  Administered 2017-06-24: 7.5 mg via ORAL
  Filled 2017-06-24: qty 1

## 2017-06-24 MED ORDER — MORPHINE SULFATE (PF) 2 MG/ML IV SOLN
2.0000 mg | INTRAVENOUS | Status: DC | PRN
  Administered 2017-06-24 – 2017-06-26 (×9): 2 mg via INTRAVENOUS
  Filled 2017-06-24 (×9): qty 1

## 2017-06-24 NOTE — Consult Note (Signed)
ANTICOAGULATION CONSULT NOTE - Initial Consult  Pharmacy Consult for warfarin Indication: mesenteric ischemia  No Known Allergies  Patient Measurements: Height: 5\' 2"  (157.5 cm) Weight: 225 lb (102.1 kg) IBW/kg (Calculated) : 50.1 Heparin Dosing Weight:   Vital Signs: Temp: 97.6 F (36.4 C) (09/02 0351) Temp Source: Oral (09/02 0351) BP: 101/53 (09/02 0351) Pulse Rate: 100 (09/02 0351)  Labs:  Recent Labs  06/22/17 0252 06/23/17 0523 06/24/17 0525  HGB 9.7* 10.2* 9.9*  HCT 28.7* 30.2* 28.9*  PLT 240 261 281  HEPARINUNFRC 0.34 0.46 0.46  CREATININE 0.66 0.67 0.75    Estimated Creatinine Clearance: 86.8 mL/min (by C-G formula based on SCr of 0.75 mg/dL).   Medical History: Past Medical History:  Diagnosis Date  . Abdominal aortic atherosclerosis ([place])    a. 05/2017 CTA abd/pelvis: significant atherosclerotic dzs of the infrarenal abd Ao w/ some mural thrombus. No aneurysm or dissection.  . Baker's cyst of knee, right    a. 07/2016 U/S: 4.1 x 1.4 x 2.9 cystic structure in R poplitetal fossa.  . Bell's palsy   . Chest pain    a. 08/2012 Lexiscan MV: EF 54%, non ischemia/infarct.  . Diastolic dysfunction    a. 05/2017 Echo: Ef 60-65%, no rwma, Gr1 DD, no source of cardiac emboli.  . 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 05/2017 CTA Abd/pelvis: apparent thrombus or embolus in prox SMA (70-90%); b. 05/2017 catheter directed tPA, mechanical thrombectomy, and stenting of the SMA.  [name] GERD (gastroesophageal reflux disease)   . Hyperlipidemia   . Hypertension   . Morbid obesity with BMI of 40.0-44.9, adult (HCC)     Medications:  Scheduled:  . aspirin EC  81 mg Oral Daily  . atorvastatin  80 mg Oral QHS  . chlorhexidine  15 mL Mouth Rinse BID  . feeding supplement (ENSURE ENLIVE)  237 mL Oral BID BM  . mouth rinse  15 mL Mouth Rinse q12n4p  . pantoprazole (PROTONIX) IV  40 mg Intravenous Q24H  . warfarin  7.5 mg Oral ONCE-1800  . 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 Pt is s/p thrmbectomy. Currently on heparin drip. No cardiac source of emboli found.  Goal of Therapy:  INR 2-3 Monitor platelets by anticoagulation protocol: Yes   Plan:  Will start with warfarin 7.5mg  tonight. Follow up INR in the AM. Continue heparin drip until INR is therapeutic x 2  Abhiraj Dozal D Varun Jourdan, Pharm.D, BCPS Clinical Pharmacist  06/24/2017,1:29 PM

## 2017-06-24 NOTE — Progress Notes (Signed)
Per Dr. Burt Knack okay to discontinue Morphine PCA order and Order set. Care order instruction was placed to discontinue PCA. Also per MD okay to discontinue telemetry and continuous pulse ox. Per MD place order for general medicine consult for heparin and coumadin management. RN to place orders.

## 2017-06-24 NOTE — Progress Notes (Signed)
CC: SMA thrombosis Subjective: Patient feels well today she has minimal if any abdominal pain. No nausea vomiting no fevers or chills. She is tolerating a full liquid diet and would request moving to soft diet. She is also on IV heparin and a PCA.  Objective: Vital signs in last 24 hours: Temp:  [97.6 F (36.4 C)-98.3 F (36.8 C)] 97.6 F (36.4 C) (09/02 0351) Pulse Rate:  [62-107] 100 (09/02 0351) Resp:  [12-19] 12 (09/02 0353) BP: (101-155)/(53-104) 101/53 (09/02 0351) SpO2:  [91 %-100 %] 95 % (09/02 0353) FiO2 (%):  [100 %] 100 % (09/01 2000) Last BM Date: 06/17/17  Intake/Output from previous day: 09/01 0701 - 09/02 0700 In: 5737.4 [I.V.:5666.3; IV Piggyback:71.1] Out: 2325 [Urine:2325] Intake/Output this shift: Total I/O In: 519 [I.V.:477; IV Piggyback:42] Out: -   Physical exam:  Awake alert and oriented Vital signs stable and reviewed Abdomen soft and nontender today Calves are nontender No icterus no jaundice  Lab Results: CBC   Recent Labs  06/23/17 0523 06/24/17 0525  WBC 5.1 5.6  HGB 10.2* 9.9*  HCT 30.2* 28.9*  PLT 261 281   BMET  Recent Labs  06/23/17 0523 06/24/17 0525  NA 143 139  K 4.0 3.7  CL 108 104  CO2 28 29  GLUCOSE 110* 130*  BUN <5* 5*  CREATININE 0.67 0.75  CALCIUM 8.4* 8.3*   PT/INR No results for input(s): LABPROT, INR in the last 72 hours. ABG No results for input(s): PHART, HCO3 in the last 72 hours.  Invalid input(s): PCO2, PO2  Studies/Results: No results found.  Anti-infectives: Anti-infectives    Start     Dose/Rate Route Frequency Ordered Stop   06/20/17 1100  cefUROXime (ZINACEF) 1.5 g in dextrose 5 % 50 mL IVPB     1.5 g 100 mL/hr over 30 Minutes Intravenous 30 min pre-op 06/20/17 1100 06/20/17 1625   06/20/17 0230  piperacillin-tazobactam (ZOSYN) IVPB 3.375 g  Status:  Discontinued     3.375 g 12.5 mL/hr over 240 Minutes Intravenous Every 8 hours 06/20/17 0143 06/24/17 0833   06/20/17 0000   piperacillin-tazobactam (ZOSYN) IVPB 3.375 g  Status:  Discontinued     3.375 g 12.5 mL/hr over 240 Minutes Intravenous Every 8 hours 06/19/17 2000 06/20/17 0143   06/19/17 2015  piperacillin-tazobactam (ZOSYN) IVPB 3.375 g     3.375 g 100 mL/hr over 30 Minutes Intravenous  Once 06/19/17 2000 06/19/17 2301   06/19/17 1930  piperacillin-tazobactam (ZOSYN) IVPB 3.375 g  Status:  Discontinued     3.375 g 12.5 mL/hr over 240 Minutes Intravenous Every 6 hours 06/19/17 1924 06/19/17 2000      Assessment/Plan:  Labs reviewed Patient doing quite well. Will DC PCA and Foley catheter and advance diet. Her graft patient could be transitioned to an oral anticoagulant but I will leave this up to prime doc as to choice and timing.  Florene Glen, MD, FACS  06/24/2017

## 2017-06-24 NOTE — Progress Notes (Signed)
ANTICOAGULATION CONSULT NOTE - Follow Up Consult  Pharmacy Consult for Heparin  Indication: mesenteric ischemia  No Known Allergies  Patient Measurements: Height: 5\' 2"  (157.5 cm) Weight: 225 lb (102.1 kg) IBW/kg (Calculated) : 50.1 Heparin Dosing Weight: 74.7 kg   Vital Signs: Temp: 97.6 F (36.4 C) (09/02 0351) Temp Source: Oral (09/02 0351) BP: 101/53 (09/02 0351) Pulse Rate: 100 (09/02 0351)  Labs:  Recent Labs  06/22/17 0252 06/23/17 0523 06/24/17 0525  HGB 9.7* 10.2* 9.9*  HCT 28.7* 30.2* 28.9*  PLT 240 261 281  HEPARINUNFRC 0.34 0.46 0.46  CREATININE 0.66 0.67 0.75    Estimated Creatinine Clearance: 86.8 mL/min (by C-G formula based on SCr of 0.75 mg/dL).   Medications:  Prescriptions Prior to Admission  Medication Sig Dispense Refill Last Dose  . aspirin EC 81 MG tablet Take 81 mg by mouth daily.   06/18/2017 at 1200  . atorvastatin (LIPITOR) 80 MG tablet Take 80 mg by mouth at bedtime.   06/16/2017 at pm  . gabapentin (NEURONTIN) 300 MG capsule Take 300 mg by mouth 2 (two) times daily.   06/05/2017 at unknown  . lisinopril (PRINIVIL,ZESTRIL) 40 MG tablet Take 40 mg by mouth daily.   06/18/2017 at pm  . metoprolol tartrate (LOPRESSOR) 25 MG tablet Take 25 mg by mouth 2 (two) times daily.   06/18/2017 at 1200  . omeprazole (PRILOSEC) 20 MG capsule Take 20 mg by mouth 2 (two) times daily.   06/05/2017 at unknown  . traMADol (ULTRAM) 50 MG tablet Take 50 mg by mouth every 6 (six) hours as needed for moderate pain.    PRN at PRN    Assessment: 56 y/o F admitted with ischemic bowel and hypertensive urgency. 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 Patient's heparin is currently infusing at 1,500 units/hr.   09/01 AM labs: Heparin resulted at 0.46.  Goal of Therapy:  Heparin level 0.3-0.7 units/ml Monitor platelets by anticoagulation protocol: Yes   Plan:  Will continue heparin infusion at 1500 unit/hr  and f/u AM labs.   Pharmacy will continue to monitor and adjust per consult.   9/2 AM heparin level 0.46 Continue current regimen. Recheck heparin level and CBC with tomorrow AM labs.  Sim Boast, PharmD, BCPS  06/24/17 6:47 AM

## 2017-06-24 NOTE — Progress Notes (Signed)
[place] at [place] NAME: Carla [name]    MR#:  [id number]  DATE OF BIRTH:  Nov 22, 1960  SUBJECTIVE:  CHIEF COMPLAINT:   Chief Complaint  Patient presents with  . Chest Pain   No abdominal pain or nausea, tolerated diet, on heparin drip. REVIEW OF SYSTEMS:  Review of Systems  Constitutional: Negative for chills, fever and malaise/fatigue.  HENT: Negative for sore throat.   Eyes: Negative for blurred vision and double vision.  Respiratory: Negative for cough, hemoptysis, shortness of breath, wheezing and stridor.   Cardiovascular: Negative for chest pain, palpitations, orthopnea and leg swelling.  Gastrointestinal: Negative for abdominal pain, blood in stool, diarrhea, melena, nausea and vomiting.  Genitourinary: Negative for dysuria, flank pain and hematuria.  Musculoskeletal: Negative for back pain and joint pain.  Skin: Negative for rash.  Neurological: Negative for dizziness, sensory change, focal weakness, seizures, loss of consciousness, weakness and headaches.  Endo/Heme/Allergies: Negative for polydipsia.  Psychiatric/Behavioral: Negative for depression. The patient is not nervous/anxious.     DRUG ALLERGIES:  No Known Allergies VITALS:  Blood pressure (!) 101/53, pulse 100, temperature 97.6 F (36.4 C), temperature source Oral, resp. rate 14, height 5\' 2"  (1.575 m), weight 225 lb (102.1 kg), SpO2 94 %. PHYSICAL EXAMINATION:  Physical Exam  Constitutional: She is oriented to person, place, and time and well-developed, well-nourished, and in no distress.  obese  HENT:  Head: Normocephalic.  Mouth/Throat: Oropharynx is clear and moist.  Eyes: Pupils are equal, round, and reactive to light. Conjunctivae and EOM are normal. No scleral icterus.  Neck: Normal range of motion. Neck supple. No JVD present. No tracheal deviation present.  Cardiovascular: Normal rate, regular rhythm and normal heart sounds.  Exam reveals no gallop.    No murmur heard. Pulmonary/Chest: Effort normal and breath sounds normal. No respiratory distress. She has no wheezes. She has no rales.  Abdominal: Soft. Bowel sounds are normal. She exhibits no distension. There is no tenderness. There is no rebound.  Diffuse tenderness  Musculoskeletal: Normal range of motion. She exhibits no edema or tenderness.  Neurological: She is alert and oriented to person, place, and time. No cranial nerve deficit.  Skin: No rash noted. No erythema.  Psychiatric: Affect normal.   LABORATORY PANEL:  Female CBC  Recent Labs Lab 06/24/17 0525  WBC 5.6  HGB 9.9*  HCT 28.9*  PLT 281   ------------------------------------------------------------------------------------------------------------------ Chemistries   Recent Labs Lab 06/18/17 1954  06/24/17 0525  NA 136  < > 139  K 3.4*  < > 3.7  CL 98*  < > 104  CO2 26  < > 29  GLUCOSE 167*  < > 130*  BUN 16  < > 5*  CREATININE 1.36*  < > 0.75  CALCIUM 9.6  < > 8.3*  MG  --   < > 1.7  AST 23  --   --   ALT 38  --   --   ALKPHOS 135*  --   --   BILITOT 0.6  --   --   < > = values in this interval not displayed. RADIOLOGY:  No results found. ASSESSMENT AND PLAN:   Carla [name] is an 56 y.o. female. With past medical history of GERD, hypertension, hyperlipidemia and morbid obesity came into the ED with a chief complaint of generalized abdominal pain for one week which has been worse for 3 days associated with nausea. Denies any hematemesis. Patient is admitted  to surgery for ischemic bowel.   # hypertensive urgency, improved. BP is low side. hold Lopressor  #Palpitations-intermittent, resolved. HR is normal. Per cardiology consult,all available 12-lead EKGs show sinus rhythm. Echo: No cardiac source of emboli was indentified. EF 60%.  #hypokalemia given potassium and improved. Magnesium level is normal.  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 S/p SMA thrombectomy,  thrombolysis, and covered stent placement. TEE: No cardiac source of emboli was indentified. On heparin drip, could be transitioned to an oral anticoagulant per Dr. Burt Knack. Start coumadin PTD.  Hyperlipidemia. Continue statin therapy Obesity. Tobacco abuse. Smoking cessation was counseled.  All the records are reviewed and case discussed with Care Management/Social Worker. Management plans discussed with the patient, family and they are in agreement.  CODE STATUS: Full Code  TOTAL TIME TAKING CARE OF THIS PATIENT: 32 minutes.   More than 50% of the time was spent in counseling/coordination of care: YES  POSSIBLE D/C IN 2 DAYS, DEPENDING ON CLINICAL CONDITION.   Demetrios Loll M.D on 06/24/2017 at 1:02 PM  Between 7am to 6pm - Pager - [phone]  After 6pm go to [email or web address] - [facility]

## 2017-06-24 NOTE — Progress Notes (Signed)
Pharmacy Consult for Electrolyte Monitoring Indication: Hypokalemia  No Known Allergies  Patient Measurements: Height: 5\' 2"  (157.5 cm) Weight: 225 lb (102.1 kg) IBW/kg (Calculated) : 50.1  Vital Signs: Temp: 97.6 F (36.4 C) (09/02 0351) Temp Source: Oral (09/02 0351) BP: 101/53 (09/02 0351) Pulse Rate: 100 (09/02 0351) Intake/Output from previous day: 09/01 0701 - 09/02 0700 In: 5737.4 [I.V.:5666.3; IV Piggyback:71.1] Out: 2325 [Urine:2325] Intake/Output from this shift: Total I/O In: 519 [I.V.:477; IV Piggyback:42] Out: -   Labs:  Recent Labs  06/22/17 0252 06/23/17 0523 06/24/17 0525  WBC 7.8 5.1 5.6  HGB 9.7* 10.2* 9.9*  HCT 28.7* 30.2* 28.9*  PLT 240 261 281  CREATININE 0.66 0.67 0.75  MG 2.0 1.8 1.7   Estimated Creatinine Clearance: 86.8 mL/min (by C-G formula based on SCr of 0.75 mg/dL).  Sodium (mmol/L)  Date Value  06/24/2017 139  02/12/2015 140   Potassium (mmol/L)  Date Value  06/24/2017 3.7  02/12/2015 2.9 (L)   Calcium (mg/dL)  Date Value  06/24/2017 8.3 (L)   Calcium, Total (mg/dL)  Date Value  02/12/2015 9.0    Assessment: Pharmacy consulted to assist in managing electrolytes in this 56 y/o F with mesenteric ischemia.   Plan:  8/31: Diet initiated with feeding supplement that should help to maintain normal K level. K replaced this AM with oral and IV supplementation. Will f/u AM labs.   9/1: K 4.0, Mag 1.8 - WNL no supplementation needed at this time. Will f/u AM labs.   9/2: K 3.7, Mag 1.7 - will order mag sulfate 2 g IV x1, recheck AM labs.   Pharmacy will continue to monitor and adjust per consult.   Rocky Morel 06/24/2017,11:52 AM

## 2017-06-25 LAB — CBC
HCT: 29.6 % — ABNORMAL LOW (ref 35.0–47.0)
Hemoglobin: 9.9 g/dL — ABNORMAL LOW (ref 12.0–16.0)
MCH: 29.5 pg (ref 26.0–34.0)
MCHC: 33.5 g/dL (ref 32.0–36.0)
MCV: 88 fL (ref 80.0–100.0)
Platelets: 288 10*3/uL (ref 150–440)
RBC: 3.37 MIL/uL — ABNORMAL LOW (ref 3.80–5.20)
RDW: 14.6 % — ABNORMAL HIGH (ref 11.5–14.5)
WBC: 5.2 10*3/uL (ref 3.6–11.0)

## 2017-06-25 LAB — BASIC METABOLIC PANEL
Anion gap: 6 (ref 5–15)
BUN: 9 mg/dL (ref 6–20)
CO2: 29 mmol/L (ref 22–32)
Calcium: 8.3 mg/dL — ABNORMAL LOW (ref 8.9–10.3)
Chloride: 103 mmol/L (ref 101–111)
Creatinine, Ser: 0.62 mg/dL (ref 0.44–1.00)
GFR calc Af Amer: >60 mL/min (ref >60)
GFR calc non Af Amer: >60 mL/min (ref >60)
Glucose, Bld: 140 mg/dL — ABNORMAL HIGH (ref 65–99)
Potassium: 3.8 mmol/L (ref 3.5–5.1)
Sodium: 138 mmol/L (ref 135–145)

## 2017-06-25 LAB — PROTIME-INR
INR: 0.98
Prothrombin Time: 12.9 seconds (ref 11.4–15.2)

## 2017-06-25 LAB — MAGNESIUM: Magnesium: 2.1 mg/dL (ref 1.7–2.4)

## 2017-06-25 LAB — HEPARIN LEVEL (UNFRACTIONATED): Heparin Unfractionated: 0.44 IU/mL (ref 0.30–0.70)

## 2017-06-25 MED ORDER — ALPRAZOLAM 0.5 MG PO TABS
0.5000 mg | ORAL_TABLET | Freq: Two times a day (BID) | ORAL | Status: DC | PRN
  Administered 2017-06-25 (×2): 0.5 mg via ORAL
  Filled 2017-06-25 (×2): qty 1

## 2017-06-25 MED ORDER — WARFARIN SODIUM 7.5 MG PO TABS
7.5000 mg | ORAL_TABLET | Freq: Once | ORAL | Status: AC
  Administered 2017-06-25: 7.5 mg via ORAL
  Filled 2017-06-25: qty 1

## 2017-06-25 MED ORDER — PANTOPRAZOLE SODIUM 40 MG PO TBEC
40.0000 mg | DELAYED_RELEASE_TABLET | ORAL | Status: DC
  Administered 2017-06-25 – 2017-06-27 (×3): 40 mg via ORAL
  Filled 2017-06-25 (×3): qty 1

## 2017-06-25 MED ORDER — OXYCODONE HCL 5 MG PO TABS
5.0000 mg | ORAL_TABLET | ORAL | Status: DC | PRN
  Administered 2017-06-25: 10 mg via ORAL
  Administered 2017-06-25: 5 mg via ORAL
  Administered 2017-06-26 – 2017-06-28 (×11): 10 mg via ORAL
  Filled 2017-06-25: qty 2
  Filled 2017-06-25: qty 1
  Filled 2017-06-25 (×11): qty 2

## 2017-06-25 MED ORDER — ACETAMINOPHEN 500 MG PO TABS: 1000.0000 mg | ORAL_TABLET | Freq: Four times a day (QID) | ORAL | Status: DC | PRN

## 2017-06-25 MED ORDER — METOPROLOL TARTRATE 25 MG PO TABS
12.5000 mg | ORAL_TABLET | Freq: Two times a day (BID) | ORAL | Status: DC
  Administered 2017-06-25 – 2017-06-28 (×7): 12.5 mg via ORAL
  Filled 2017-06-25 (×7): qty 1

## 2017-06-25 NOTE — Progress Notes (Signed)
Pharmacy Consult for Electrolyte Monitoring Indication: Hypokalemia  No Known Allergies  Patient Measurements: Height: 5\' 2"  (157.5 cm) Weight: 225 lb (102.1 kg) IBW/kg (Calculated) : 50.1  Vital Signs: Temp: 98.4 F (36.9 C) (09/02 2108) Temp Source: Oral (09/02 2108) BP: 151/79 (09/02 2108) Pulse Rate: 94 (09/02 2108) Intake/Output from previous day: 09/02 0701 - 09/03 0700 In: 2811 [I.V.:2769; IV Piggyback:42] Out: 2600 [Urine:2600] Intake/Output from this shift: Total I/O In: 979 [I.V.:979] Out: 1500 [Urine:1500]  Labs:  Recent Labs  06/23/17 0523 06/24/17 0525 06/25/17 0259  WBC 5.1 5.6 5.2  HGB 10.2* 9.9* 9.9*  HCT 30.2* 28.9* 29.6*  PLT 261 281 288  CREATININE 0.67 0.75 0.62  MG 1.8 1.7 2.1   Estimated Creatinine Clearance: 86.8 mL/min (by C-G formula based on SCr of 0.62 mg/dL).  Sodium (mmol/L)  Date Value  06/25/2017 138  02/12/2015 140   Potassium (mmol/L)  Date Value  06/25/2017 3.8  02/12/2015 2.9 (L)   Calcium (mg/dL)  Date Value  06/25/2017 8.3 (L)   Calcium, Total (mg/dL)  Date Value  02/12/2015 9.0    Assessment: Pharmacy consulted to assist in managing electrolytes in this 56 y/o F with mesenteric ischemia.   Plan:  8/31: Diet initiated with feeding supplement that should help to maintain normal K level. K replaced this AM with oral and IV supplementation. Will f/u AM labs.   9/1: K 4.0, Mag 1.8 - WNL no supplementation needed at this time. Will f/u AM labs.   9/2: K 3.7, Mag 1.7 - will order mag sulfate 2 g IV x1, recheck AM labs.   9/3 Electrolyte panel, no replacement indicated. F/u BMP in AM.  Pharmacy will continue to monitor and adjust per consult.   Carla [name] 06/25/2017,4:53 AM

## 2017-06-25 NOTE — Care Management (Signed)
RNCM spoke with patient regarding Coumadin use at discharge and the need to follow up for labs. She agrees. She goes to a [facility] however feels that it would be better to go somewhere closer such as [facility]. I have notified [facility] of this need. RNCM team to continue to follow.

## 2017-06-25 NOTE — Consult Note (Signed)
ANTICOAGULATION CONSULT NOTE - Follow up [place] for warfarin Indication: mesenteric ischemia  No Known Allergies  Patient Measurements: Height: 5\' 2"  (157.5 cm) Weight: 225 lb (102.1 kg) IBW/kg (Calculated) : 50.1   Vital Signs: Temp: 98.4 F (36.9 C) (09/03 0615) Temp Source: Oral (09/03 0615) BP: 148/97 (09/03 0615) Pulse Rate: 80 (09/03 0615)  Labs:  Recent Labs  06/23/17 0523 06/24/17 0525 06/25/17 0259  HGB 10.2* 9.9* 9.9*  HCT 30.2* 28.9* 29.6*  PLT 261 281 288  LABPROT  --   --  12.9  INR  --   --  0.98  HEPARINUNFRC 0.46 0.46 0.44  CREATININE 0.67 0.75 0.62    Estimated Creatinine Clearance: 86.8 mL/min (by C-G formula based on SCr of 0.62 mg/dL).   Assessment: Pt is a 56 year old female who was found to have a SMA thrombosis. Pt is s/p thrmbectomy. Currently on heparin drip. No cardiac source of emboli found. INR 8/28 =1.15.   Date INR Warfarin dose 9/2  -- 7.5 mg 9/3 0.98   Goal of Therapy:  INR 2-3 Monitor platelets by anticoagulation protocol: Yes   Plan:  Will repeat warfarin 7.5mg  tonight. Follow up INR in the AM. Continue heparin drip until INR is therapeutic x 2. CBC stable.   Pharmacy will continue to follow.   Rayna Sexton, PharmD, BCPS Clinical Pharmacist 06/25/2017 10:28 AM   06/25/2017,10:25 AM

## 2017-06-25 NOTE — Care Management (Addendum)
Eliquis coupon delivered to patient- Patient does not have health insurance. I spoke with Dr.  Hampton Abbot -- RNCM to follow up with [facility] (email sent to [facility] to establish appointment for PT/INR). Plan discussed with MD regarding 30-day coverage for Eliquis will allow time to get patient established with [facility] for PT/INR monitoring/adjusting dose. Plan is unclear at this time. Colletta Maryland RNCM included in this discussion as she will assume care of this patient tomorrow (06/26/17).

## 2017-06-25 NOTE — Progress Notes (Signed)
PHARMACIST - PHYSICIAN COMMUNICATION  CONCERNING: IV to Oral Route Change Policy  RECOMMENDATION: This patient is receiving pantoprazole by the intravenous route.  Based on criteria approved by the Pharmacy and Therapeutics Committee, the intravenous medication(s) is/are being converted to the equivalent oral dose form(s).   DESCRIPTION: These criteria include:  The patient is eating (either orally or via tube) and/or has been taking other orally administered medications for a least 24 hours  The patient has no evidence of active gastrointestinal bleeding or impaired GI absorption (gastrectomy, short bowel, patient on TNA or NPO).  If you have questions about this conversion, please contact the Pharmacy Department  []   [phone] )  [facility] [x]   [phone] )  [facility] []   [phone] )  [facility] []   [phone] )  [facility] []   [phone] )  [place], [facility] 06/25/2017 10:32 AM

## 2017-06-25 NOTE — Progress Notes (Signed)
Carla [name] at [place] NAME: Carla [name]    MR#:  [id number]  DATE OF BIRTH:  [birth year]  SUBJECTIVE:  CHIEF COMPLAINT:   Chief Complaint  Patient presents with  . Chest Pain   No abdominal pain or nausea, tolerated diet, on heparin drip. REVIEW OF SYSTEMS:  Review of Systems  Constitutional: Negative for chills, fever and malaise/fatigue.  HENT: Negative for sore throat.   Eyes: Negative for blurred vision and double vision.  Respiratory: Negative for cough, hemoptysis, shortness of breath, wheezing and stridor.   Cardiovascular: Negative for chest pain, palpitations, orthopnea and leg swelling.  Gastrointestinal: Negative for abdominal pain, blood in stool, diarrhea, melena, nausea and vomiting.  Genitourinary: Negative for dysuria, flank pain and hematuria.  Musculoskeletal: Negative for back pain and joint pain.  Skin: Negative for rash.  Neurological: Negative for dizziness, sensory change, focal weakness, seizures, loss of consciousness, weakness and headaches.  Endo/Heme/Allergies: Negative for polydipsia.  Psychiatric/Behavioral: Negative for depression. The patient is not nervous/anxious.     DRUG ALLERGIES:  No Known Allergies VITALS:  Blood pressure (!) 148/97, pulse 80, temperature 98.4 F (36.9 C), temperature source Oral, resp. rate 18, height 5\' 2"  (1.575 m), weight 225 lb (102.1 kg), SpO2 93 %. PHYSICAL EXAMINATION:  Physical Exam  Constitutional: She is oriented to person, place, and time and well-developed, well-nourished, and in no distress.  obese  HENT:  Head: Normocephalic.  Mouth/Throat: Oropharynx is clear and moist.  Eyes: Pupils are equal, round, and reactive to light. Conjunctivae and EOM are normal. No scleral icterus.  Neck: Normal range of motion. Neck supple. No JVD present. No tracheal deviation present.  Cardiovascular: Normal rate, regular rhythm and normal heart sounds.  Exam reveals no gallop.     No murmur heard. Pulmonary/Chest: Effort normal and breath sounds normal. No respiratory distress. She has no wheezes. She has no rales.  Abdominal: Soft. Bowel sounds are normal. She exhibits no distension. There is no tenderness. There is no rebound.  Musculoskeletal: Normal range of motion. She exhibits no edema or tenderness.  Neurological: She is alert and oriented to person, place, and time. No cranial nerve deficit.  Skin: No rash noted. No erythema.  Psychiatric: Affect normal.   LABORATORY PANEL:  Female CBC  Recent Labs Lab 06/25/17 0259  WBC 5.2  HGB 9.9*  HCT 29.6*  PLT 288   ------------------------------------------------------------------------------------------------------------------ Chemistries   Recent Labs Lab 06/18/17 1954  06/25/17 0259  NA 136  < > 138  K 3.4*  < > 3.8  CL 98*  < > 103  CO2 26  < > 29  GLUCOSE 167*  < > 140*  BUN 16  < > 9  CREATININE 1.36*  < > 0.62  CALCIUM 9.6  < > 8.3*  MG  --   < > 2.1  AST 23  --   --   ALT 38  --   --   ALKPHOS 135*  --   --   BILITOT 0.6  --   --   < > = values in this interval not displayed. RADIOLOGY:  No results found. ASSESSMENT AND PLAN:   Carla [name] is an 56 y.o. female. With past medical history of GERD, hypertension, hyperlipidemia and morbid obesity came into the ED with a chief complaint of generalized abdominal pain for one week which has been worse for 3 days associated with nausea. Denies any hematemesis. Patient is admitted to surgery  for ischemic bowel.   # hypertensive urgency, improved. resume Lopressor  #Palpitations-intermittent, resolved. HR is normal. Per cardiology consult,all available 12-lead EKGs show sinus rhythm. Echo: No cardiac source of emboli was indentified. EF 60%.  #hypokalemia given potassium and improved. Magnesium level is normal.  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 S/p SMA thrombectomy, thrombolysis, and covered stent  placement. TEE: No cardiac source of emboli was indentified. On heparin drip, could be transitioned to an oral anticoagulant per Dr. Burt Knack. Started coumadin PTD. Per Dr. Hampton Abbot, Eliquis would be a possible option for outpatient anticoagulation, and this would avoid the need for frequent INR checks.    Hyperlipidemia. Continue statin therapy Obesity. Tobacco abuse. Smoking cessation was counseled.  Medically stable, sign off. All the records are reviewed and case discussed with Care Management/Social Worker. Management plans discussed with the patient, family and they are in agreement.  CODE STATUS: Full Code  TOTAL TIME TAKING CARE OF THIS PATIENT: 25 minutes.   More than 50% of the time was spent in counseling/coordination of care: YES  POSSIBLE D/C IN 1-2 DAYS, DEPENDING ON CLINICAL CONDITION.   Carla [name] M.D on 06/25/2017 at 12:48 PM  Between 7am to 6pm - Pager - [phone]  After 6pm go to [email or web address] - [facility]

## 2017-06-25 NOTE — Evaluation (Signed)
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 She had catheter placed into SMA and mechanical thrombolysis performed along with TPA administration.   Clinical Impression  Patient underwent major abdominal revascularization procedure and appears to be doing well afterwards, though she has continued to report pain in her abdomen. 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 Today she requires cga-min A for bed mobility and transfers secondary to weakness (has been on bed rest for almost 1 week with liquid diet). She is able to ambulate to the door with 3 separate rest breaks secondary to fatigue in her LEs. Overall she tolerated session well with no increase in abdominal discomfort. Given her significant limitation in mobility currently, she would benefit from STR once medically stable to discharge.     Follow Up Recommendations SNF    Equipment Recommendations  Rolling walker with 5" wheels    Recommendations for Other Services       Precautions / Restrictions Precautions Precautions: Fall Restrictions Weight Bearing Restrictions: No      Mobility  Bed Mobility Overal bed mobility: Needs Assistance Bed Mobility: Supine to Sit     Supine to sit: Min guard;Min assist     General bed mobility comments: Patient requires assistance to elevate her torso even with log roll and use of UEs technique.   Transfers Overall transfer level: Needs assistance Equipment used: Rolling walker (2 wheeled) Transfers: Sit to/from Stand Sit to Stand: Min guard;Min assist         General transfer comment: Patient is able to slowly complete transfer with cuing for proper  technique with RW and pushing rather than pulling RW   Ambulation/Gait Ambulation/Gait assistance: Min guard Ambulation Distance (Feet): 12 Feet Assistive device: Rolling walker (2 wheeled) Gait Pattern/deviations: Decreased step length - right;Decreased step length - left;Trunk flexed;Decreased stance time - right   Gait velocity interpretation: <1.8 ft/sec, indicative of risk for recurrent falls General Gait Details: Patient initially noted to have difficulty WBing through RLE, per patient she has progressing OA and needs a TKR. She is able to ambulate to the doorway with several seated rest breaks and is able to increase the ROM and WBing in gait on RLE throughout bouts.   Stairs            Wheelchair Mobility    Modified Rankin (Stroke Patients Only)       Balance Overall balance assessment: Needs assistance Sitting-balance support: No upper extremity supported Sitting balance-[name]: Good     Standing balance support: Bilateral upper extremity supported Standing balance-[name]: Fair                               Pertinent Vitals/Pain Pain Assessment: Faces Faces Pain Scale: Hurts little more Pain Location: Abdominal area, R knee  Pain Descriptors / Indicators: Tightness Pain Intervention(s): Monitored during session;Limited activity within patient's tolerance;Repositioned    Home Living Family/patient expects to be discharged to:: Private residence Living Arrangements: Spouse/significant other Available Help at Discharge: Family Type of Home: House         Home Equipment: Gilford Rile -  2 wheels      Prior Function Level of Independence: Independent with assistive device(s)         Comments: Patient reports she lived at home with her husband prior to this admission, she has used a RW in the past at home.      Hand Dominance        Extremity/Trunk Assessment   Upper Extremity Assessment Upper Extremity Assessment: Overall WFL  for tasks assessed    Lower Extremity Assessment Lower Extremity Assessment: Overall WFL for tasks assessed       Communication   Communication: No difficulties  Cognition Arousal/Alertness: Awake/alert Behavior During Therapy: WFL for tasks assessed/performed Overall Cognitive Status: Within Functional Limits for tasks assessed                                        General Comments      Exercises     Assessment/Plan    PT Assessment Patient needs continued PT services  PT Problem List Decreased strength;Decreased activity tolerance;Decreased balance;Pain;Decreased knowledge of use of DME;Decreased mobility       PT Treatment Interventions DME instruction;Gait training;Stair training;Balance training;Therapeutic activities;Therapeutic exercise;Patient/family education;Neuromuscular re-education;Functional mobility training    PT Goals (Current goals can be found in the Care Plan section)  Acute Rehab PT Goals Patient Stated Goal: To return home  PT Goal Formulation: With patient Time For Goal Achievement: 07/09/17 Potential to Achieve Goals: Good    Frequency Min 2X/week   Barriers to discharge        Co-evaluation               AM-PAC PT "6 Clicks" Daily Activity  Outcome Measure Difficulty turning over in bed (including adjusting bedclothes, sheets and blankets)?: A Little Difficulty moving from lying on back to sitting on the side of the bed? : A Little Difficulty sitting down on and standing up from a chair with arms (e.g., wheelchair, bedside commode, etc,.)?: A Little Help needed moving to and from a bed to chair (including a wheelchair)?: A Little Help needed walking in hospital room?: A Lot Help needed climbing 3-5 steps with a railing? : A Lot 6 Click Score: 16    End of Session Equipment Utilized During Treatment: Gait belt Activity Tolerance: Patient tolerated treatment well Patient left: in chair;with chair alarm set;with  call bell/phone within reach Nurse Communication: Mobility status PT Visit Diagnosis: Difficulty in walking, not elsewhere classified (R26.2);Muscle weakness (generalized) (M62.81)    Time: 7353-2992 PT Time Calculation (min) (ACUTE ONLY): 21 min   Charges:   PT Evaluation $PT Eval Moderate Complexity: 1 Mod     PT G Codes:   PT G-Codes **NOT FOR INPATIENT CLASS** Functional Assessment Tool Used: AM-PAC 6 Clicks Basic Mobility Functional Limitation: Mobility: Walking and moving around Mobility: Walking and Moving Around Current Status (E2683): At least 40 percent but less than 60 percent impaired, limited or restricted Mobility: Walking and Moving Around Goal Status [phone]): At least 20 percent but less than 40 percent impaired, limited or restricted   Royce Macadamia PT, DPT, CSCS    06/25/2017, 4:03 PM

## 2017-06-25 NOTE — Progress Notes (Signed)
06/25/2017  Subjective: No acute events overnight.  Has remained therapeutic on Heparin drip and was started on coumadin last night.  Reports that will have intermittent abdomina pain in the low abdomen, which occurs not during meals, but sometimes after.  However, all of her labs have normalized.  Vital signs: Temp:  [98 F (36.7 C)-98.4 F (36.9 C)] 98.4 F (36.9 C) (09/03 0615) Pulse Rate:  [80-94] 80 (09/03 0615) Resp:  [14-18] 18 (09/03 0615) BP: (105-151)/(56-97) 148/97 (09/03 0615) SpO2:  [93 %-95 %] 93 % (09/03 0615)   Intake/Output: 09/02 0701 - 09/03 0700 In: 2811 [I.V.:2769; IV Piggyback:42] Out: 2600 [Urine:2600] Last BM Date: 06/17/17  Physical Exam: Constitutional: No acute distress Abdomen:  Soft, nondistended, obese, with some tenderness to palpation over the low abdomen, but no peritoneal signs.  Labs:   Recent Labs  06/24/17 0525 06/25/17 0259  WBC 5.6 5.2  HGB 9.9* 9.9*  HCT 28.9* 29.6*  PLT 281 288    Recent Labs  06/24/17 0525 06/25/17 0259  NA 139 138  K 3.7 3.8  CL 104 103  CO2 29 29  GLUCOSE 130* 140*  BUN 5* 9  CREATININE 0.75 0.62  CALCIUM 8.3* 8.3*    Recent Labs  06/25/17 0259  LABPROT 12.9  INR 0.98    Imaging: No results found.  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  Discussed with pharmacy that Eliquis would be a possible option for outpatient anticoagulation, and this would avoid the need for frequent INR checks.  Will check with CM/SW about affordability of this medication as outpatient.  If so, then will make plans to transition her to Eliquis for home.  Otherwise will continue with plans for coumadin.   Melvyn Neth, [place]

## 2017-06-25 NOTE — Progress Notes (Signed)
ANTICOAGULATION CONSULT NOTE - Follow Up Consult  Pharmacy Consult for Heparin  Indication: mesenteric ischemia  No Known Allergies  Patient Measurements: Height: 5\' 2"  (157.5 cm) Weight: 225 lb (102.1 kg) IBW/kg (Calculated) : 50.1 Heparin Dosing Weight: 74.7 kg   Vital Signs: Temp: 98.4 F (36.9 C) (09/02 2108) Temp Source: Oral (09/02 2108) BP: 151/79 (09/02 2108) Pulse Rate: 94 (09/02 2108)  Labs:  Recent Labs  06/23/17 0523 06/24/17 0525 06/25/17 0259  HGB 10.2* 9.9* 9.9*  HCT 30.2* 28.9* 29.6*  PLT 261 281 288  LABPROT  --   --  12.9  INR  --   --  0.98  HEPARINUNFRC 0.46 0.46 0.44  CREATININE 0.67 0.75 0.62    Estimated Creatinine Clearance: 86.8 mL/min (by C-G formula based on SCr of 0.62 mg/dL).   Medications:  Prescriptions Prior to Admission  Medication Sig Dispense Refill Last Dose  . aspirin EC 81 MG tablet Take 81 mg by mouth daily.   06/18/2017 at 1200  . atorvastatin (LIPITOR) 80 MG tablet Take 80 mg by mouth at bedtime.   06/16/2017 at pm  . gabapentin (NEURONTIN) 300 MG capsule Take 300 mg by mouth 2 (two) times daily.   06/05/2017 at unknown  . lisinopril (PRINIVIL,ZESTRIL) 40 MG tablet Take 40 mg by mouth daily.   06/18/2017 at pm  . metoprolol tartrate (LOPRESSOR) 25 MG tablet Take 25 mg by mouth 2 (two) times daily.   06/18/2017 at 1200  . omeprazole (PRILOSEC) 20 MG capsule Take 20 mg by mouth 2 (two) times daily.   06/05/2017 at unknown  . traMADol (ULTRAM) 50 MG tablet Take 50 mg by mouth every 6 (six) hours as needed for moderate pain.    PRN at PRN    Assessment: 56 y/o F admitted with ischemic bowel and hypertensive urgency. 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 Patient's heparin is currently infusing at 1,500 units/hr.   09/01 AM labs: Heparin resulted at 0.46.  Goal of Therapy:  Heparin level 0.3-0.7 units/ml Monitor platelets by anticoagulation protocol: Yes   Plan:   Will continue heparin infusion at 1500 unit/hr and f/u AM labs.   Pharmacy will continue to monitor and adjust per consult.   9/2 AM heparin level 0.46 Continue current regimen. Recheck heparin level and CBC with tomorrow AM labs.  9/3 AM heparin level 0.44. Continue current regimen. Recheck heparin level and CBC with tomorrow AM labs.  Sim Boast, PharmD, BCPS  06/25/17 4:56 AM

## 2017-06-26 LAB — BASIC METABOLIC PANEL
Anion gap: 8 (ref 5–15)
BUN: 15 mg/dL (ref 6–20)
CO2: 27 mmol/L (ref 22–32)
Calcium: 8.7 mg/dL — ABNORMAL LOW (ref 8.9–10.3)
Chloride: 104 mmol/L (ref 101–111)
Creatinine, Ser: 0.79 mg/dL (ref 0.44–1.00)
GFR calc Af Amer: >60 mL/min (ref >60)
GFR calc non Af Amer: >60 mL/min (ref >60)
Glucose, Bld: 111 mg/dL — ABNORMAL HIGH (ref 65–99)
Potassium: 4 mmol/L (ref 3.5–5.1)
Sodium: 139 mmol/L (ref 135–145)

## 2017-06-26 LAB — CBC
HCT: 28.8 % — ABNORMAL LOW (ref 35.0–47.0)
Hemoglobin: 9.8 g/dL — ABNORMAL LOW (ref 12.0–16.0)
MCH: 30.5 pg (ref 26.0–34.0)
MCHC: 34.1 g/dL (ref 32.0–36.0)
MCV: 89.5 fL (ref 80.0–100.0)
Platelets: 305 10*3/uL (ref 150–440)
RBC: 3.22 MIL/uL — ABNORMAL LOW (ref 3.80–5.20)
RDW: 14.7 % — ABNORMAL HIGH (ref 11.5–14.5)
WBC: 6.6 10*3/uL (ref 3.6–11.0)

## 2017-06-26 LAB — PROTIME-INR
INR: 1.17
Prothrombin Time: 14.8 seconds (ref 11.4–15.2)

## 2017-06-26 LAB — HEPARIN LEVEL (UNFRACTIONATED): Heparin Unfractionated: 0.5 IU/mL (ref 0.30–0.70)

## 2017-06-26 MED ORDER — BISACODYL 10 MG RE SUPP
10.0000 mg | Freq: Once | RECTAL | Status: AC
Start: 2017-06-26 — End: 2017-06-26
  Administered 2017-06-26: 10 mg via RECTAL
  Filled 2017-06-26: qty 1

## 2017-06-26 MED ORDER — POLYETHYLENE GLYCOL 3350 17 G PO PACK
17.0000 g | PACK | Freq: Every day | ORAL | Status: DC
  Administered 2017-06-26: 17 g via ORAL
  Filled 2017-06-26: qty 1

## 2017-06-26 MED ORDER — WARFARIN SODIUM 7.5 MG PO TABS
7.5000 mg | ORAL_TABLET | Freq: Once | ORAL | Status: AC
  Administered 2017-06-26: 7.5 mg via ORAL
  Filled 2017-06-26: qty 1

## 2017-06-26 MED ORDER — POLYETHYLENE GLYCOL 3350 17 G PO PACK
17.0000 g | PACK | Freq: Two times a day (BID) | ORAL | Status: DC
  Administered 2017-06-26 – 2017-06-28 (×4): 17 g via ORAL
  Filled 2017-06-26 (×4): qty 1

## 2017-06-26 NOTE — Progress Notes (Signed)
Nutrition Follow-up  DOCUMENTATION CODES:   Morbid obesity  INTERVENTION:   Ensure Enlive po BID, each supplement provides 350 kcal and 20 grams of protein  NUTRITION DIAGNOSIS:   Inadequate oral intake related to poor appetite, nausea (abdominal pain) as evidenced by per patient/family report. -resolving  GOAL:   Patient will meet greater than or equal to 90% of their needs  MONITOR:   PO intake, Supplement acceptance, Labs, Weight trends  ASSESSMENT:   56 year old female with PMHx of HTN, HLD, GERD, Bell's palsy who presented with one week history of abdominal pain. 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 Pt advanced to regular diet today. No new weight since 8/31. Per chart, PT recommending SNF placement at discharge.   Medications reviewed and include: aspirin, Protonix, miralax, heparin, warfarin, morphine, oxycodone   Labs reviewed: Ca 8.7(L), Mg 2.1 wnl Hgb 9.8(L), Hct 28.8(L)  Diet Order:  Diet regular Room service appropriate? Yes; Fluid consistency: Thin  Skin:  Reviewed, no issues  Last BM:  PTA (06/17/2017 per chart)  Height:   Ht Readings from Last 1 Encounters:  06/22/17 5\' 2"  (1.575 m)    Weight:   Wt Readings from Last 1 Encounters:  06/22/17 225 lb (102.1 kg)    Ideal Body Weight:  50 kg  BMI:  Body mass index is 41.15 kg/m.  Estimated Nutritional Needs:   Kcal:  1880-2190 (MSJ x 1.2-1.4)  Protein:  92-112 grams (0.9-1.1 grams/kg)  Fluid:  1.5-1.75 L/day (30-35 ml/kg IBW)  EDUCATION NEEDS:   No education needs identified at this time  Koleen Distance MS, RD, [place] Pager #- [phone] After Hours Pager: [phone]

## 2017-06-26 NOTE — Progress Notes (Signed)
ANTICOAGULATION CONSULT NOTE - Follow Up Consult  Pharmacy Consult for Heparin  Indication: mesenteric ischemia  No Known Allergies  Patient Measurements: Height: 5\' 2"  (157.5 cm) Weight: 225 lb (102.1 kg) IBW/kg (Calculated) : 50.1 Heparin Dosing Weight: 74.7 kg   Vital Signs: Temp: 98 F (36.7 C) (09/04 0355) Temp Source: Oral (09/04 0355) BP: 115/70 (09/04 0355) Pulse Rate: 70 (09/04 0355)  Labs:  Recent Labs  06/24/17 0525 06/25/17 0259 06/26/17 0433  HGB 9.9* 9.9* 9.8*  HCT 28.9* 29.6* 28.8*  PLT 281 288 305  LABPROT  --  12.9 14.8  INR  --  0.98 1.17  HEPARINUNFRC 0.46 0.44 0.50  CREATININE 0.75 0.62 0.79    Estimated Creatinine Clearance: 86.8 mL/min (by C-G formula based on SCr of 0.79 mg/dL).   Medications:  Prescriptions Prior to Admission  Medication Sig Dispense Refill Last Dose  . aspirin EC 81 MG tablet Take 81 mg by mouth daily.   06/18/2017 at 1200  . atorvastatin (LIPITOR) 80 MG tablet Take 80 mg by mouth at bedtime.   06/16/2017 at pm  . gabapentin (NEURONTIN) 300 MG capsule Take 300 mg by mouth 2 (two) times daily.   06/05/2017 at unknown  . lisinopril (PRINIVIL,ZESTRIL) 40 MG tablet Take 40 mg by mouth daily.   06/18/2017 at pm  . metoprolol tartrate (LOPRESSOR) 25 MG tablet Take 25 mg by mouth 2 (two) times daily.   06/18/2017 at 1200  . omeprazole (PRILOSEC) 20 MG capsule Take 20 mg by mouth 2 (two) times daily.   06/05/2017 at unknown  . traMADol (ULTRAM) 50 MG tablet Take 50 mg by mouth every 6 (six) hours as needed for moderate pain.    PRN at PRN    Assessment: 56 y/o F admitted with ischemic bowel and hypertensive urgency. 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 Patient's heparin is currently infusing at 1,500 units/hr.   09/01 AM labs: Heparin resulted at 0.46.  Goal of Therapy:  Heparin level 0.3-0.7 units/ml Monitor platelets by anticoagulation protocol: Yes   Plan:  Will  continue heparin infusion at 1500 unit/hr and f/u AM labs.   Pharmacy will continue to monitor and adjust per consult.   9/2 AM heparin level 0.46 Continue current regimen. Recheck heparin level and CBC with tomorrow AM labs.  9/3 AM heparin level 0.44. Continue current regimen. Recheck heparin level and CBC with tomorrow AM labs.  9/4 AM heparin level 0.50. Continue current regimen. Recheck heparin level and CBC with tomorrow AM labs.  Sim Boast, PharmD, BCPS  06/26/17 6:16 AM

## 2017-06-26 NOTE — Care Management (Signed)
Confirmed with Keri at [facility] that they have Eliquis 5mg  or Xarelto 10 mg, 15 mg, 20 mg in stock and available at no charge to the patient at discharge.

## 2017-06-26 NOTE — Telephone Encounter (Signed)
Patient currently admitted

## 2017-06-26 NOTE — Telephone Encounter (Signed)
Spoke with the inpatient nurse Abby and she was able to get the patients physical address. [address], [place], [place] [address]. Will enter information for patient to have monitor shipped to her home.

## 2017-06-26 NOTE — Consult Note (Signed)
ANTICOAGULATION CONSULT NOTE - Follow up [place] for warfarin Indication: mesenteric ischemia  No Known Allergies  Patient Measurements: Height: 5\' 2"  (157.5 cm) Weight: 225 lb (102.1 kg) IBW/kg (Calculated) : 50.1   Vital Signs: Temp: 98.2 F (36.8 C) (09/04 1339) Temp Source: Oral (09/04 1339) BP: 114/56 (09/04 1339) Pulse Rate: 81 (09/04 1339)  Labs:  Recent Labs  06/24/17 0525 06/25/17 0259 06/26/17 0433  HGB 9.9* 9.9* 9.8*  HCT 28.9* 29.6* 28.8*  PLT 281 288 305  LABPROT  --  12.9 14.8  INR  --  0.98 1.17  HEPARINUNFRC 0.46 0.44 0.50  CREATININE 0.75 0.62 0.79    Estimated Creatinine Clearance: 86.8 mL/min (by C-G formula based on SCr of 0.79 mg/dL).   Assessment: Pt is a 56 year old female who was found to have a SMA thrombosis. Pt is s/p thrmbectomy. Currently on heparin drip. No cardiac source of emboli found. INR 8/28 =1.15.   Date INR Warfarin dose 9/2  -- 7.5 mg 9/3 0.98 7.5 mg 9/4       1.17  Goal of Therapy:  INR 2-3 Monitor platelets by anticoagulation protocol: Yes   Plan:  Will repeat warfarin 7.5mg  tonight. Follow up INR in the AM. Continue heparin drip until INR is therapeutic x 2. CBC stable.   Pharmacy will continue to follow.   Paulina Fusi, PharmD, BCPS 06/26/2017 3:54 PM

## 2017-06-26 NOTE — Progress Notes (Signed)
06/26/2017  Subjective: No acute events.  Patient able to work with PT yesterday, who recommended SNF for placement.  Pain controlled.  No BMs yet since admission.  Vital signs: Temp:  [98 F (36.7 C)-98.2 F (36.8 C)] 98 F (36.7 C) (09/04 0355) Pulse Rate:  [70-88] 70 (09/04 0355) Resp:  [16-18] 16 (09/04 0355) BP: (115-141)/(69-77) 115/70 (09/04 0355) SpO2:  [96 %-97 %] 96 % (09/04 0355)   Intake/Output: 09/03 0701 - 09/04 0700 In: 2603.5 [I.V.:2603.5] Out: 4950 [Urine:4950] Last BM Date: 06/17/17  Physical Exam: Constitutional: No acute distress Abdomen:  Soft, obese, nondistended, with mild tenderness in low abdomen.  Labs:   Recent Labs  06/25/17 0259 06/26/17 0433  WBC 5.2 6.6  HGB 9.9* 9.8*  HCT 29.6* 28.8*  PLT 288 305    Recent Labs  06/25/17 0259 06/26/17 0433  NA 138 139  K 3.8 4.0  CL 103 104  CO2 29 27  GLUCOSE 140* 111*  BUN 9 15  CREATININE 0.62 0.79  CALCIUM 8.3* 8.7*    Recent Labs  06/25/17 0259 06/26/17 0433  LABPROT 12.9 14.8  INR 0.98 1.17    Imaging: No results found.  Assessment/Plan: 56 yo female w/ SMA thrombosis s/p thrombectomy and stent placement.  --continue heparin drip and daily coumadin.  INR 1.17 today. --change to regular diet --start miralax for constipation which could also be contributing to her pain. --start placement search for discharge, possibly in two days when INR therapeutic.   Melvyn Neth, [place]

## 2017-06-26 NOTE — Progress Notes (Signed)
Pharmacy Consult for Electrolyte Monitoring Indication: Hypokalemia  No Known Allergies  Patient Measurements: Height: 5\' 2"  (157.5 cm) Weight: 225 lb (102.1 kg) IBW/kg (Calculated) : 50.1  Vital Signs: Temp: 98.2 F (36.8 C) (09/04 1339) Temp Source: Oral (09/04 1339) BP: 114/56 (09/04 1339) Pulse Rate: 81 (09/04 1339) Intake/Output from previous day: 09/03 0701 - 09/04 0700 In: 2603.5 [I.V.:2603.5] Out: 4950 [Urine:4950] Intake/Output from this shift: Total I/O In: 552 [P.O.:480; I.V.:72] Out: 1550 [Urine:1550]  Labs:  Recent Labs  06/24/17 0525 06/25/17 0259 06/26/17 0433  WBC 5.6 5.2 6.6  HGB 9.9* 9.9* 9.8*  HCT 28.9* 29.6* 28.8*  PLT 281 288 305  CREATININE 0.75 0.62 0.79  MG 1.7 2.1  --    Estimated Creatinine Clearance: 86.8 mL/min (by C-G formula based on SCr of 0.79 mg/dL).  Sodium (mmol/L)  Date Value  06/26/2017 139  02/12/2015 140   Potassium (mmol/L)  Date Value  06/26/2017 4.0  02/12/2015 2.9 (L)   Calcium (mg/dL)  Date Value  06/26/2017 8.7 (L)   Calcium, Total (mg/dL)  Date Value  02/12/2015 9.0    Assessment: Pharmacy consulted to assist in managing electrolytes in this 56 y/o F with mesenteric ischemia.   Plan:  Electrolyte panel within range, no replacement indicated. F/u BMP in AM.  Pharmacy will continue to monitor and adjust per consult.   Paulina Fusi, PharmD, BCPS 06/26/2017 3:55 PM

## 2017-06-26 NOTE — Progress Notes (Signed)
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 She had catheter placed into SMA and mechanical thrombolysis performed along with TPA administration.     PT Comments    Pt demonstrates significant improvement in mobility with therapist today. She is modified independent for bed mobility and CGA only for transfers and ambulation. She is able to ambulate 2 laps in the room with therapist. Gait speed is decreased but functional for full household ambulation. Pt demonstrates safe use of rolling walker and denies DOE with ambulation. She is able to complete all seated and supine exercises as instructed. Discharge disposition updated to home with Carla [name],The PT. She would benefit from Carla [name] (3-in-1) for nighttime toileting, to raise her commodes, and to utilize in shower. RN notified that pt is safe to ambulate to/from bathroom with staff. Pt will benefit from PT services to address deficits in strength, balance, and mobility in order to return to full function at home.    Follow Up Recommendations  Home health PT     Equipment Recommendations  3in1 (PT);Other (comment) (Pt reports she already has a walker at home)    Recommendations for Other Services       Precautions / Restrictions Precautions Precautions: Fall Restrictions Weight Bearing Restrictions: No    Mobility  Bed Mobility Overal bed mobility: Modified Independent             General bed mobility comments: HOB elevated and bed rails utilized but good speed/sequencing with bed mobility  Transfers Overall transfer level: Needs assistance Equipment used: Rolling walker (2 wheeled) Transfers: Sit to/from Stand Sit to Stand: Min guard         General transfer comment: Pt demonstrates safe hand placement.  Increased time required. Stable once upright with bilateral UE support on rolling walker. Unable to perform sit to stand without UE assist  Ambulation/Gait Ambulation/Gait assistance: Min guard Ambulation Distance (Feet): 60 Feet Assistive device: Rolling walker (2 wheeled) Gait Pattern/deviations: Decreased step length - right;Decreased step length - left;Trunk flexed Gait velocity: Decreased but functional for household ambulation Gait velocity interpretation: <1.8 ft/sec, indicative of risk for recurrent falls General Gait Details: Pt able to ambulate 2 laps in the room with therapist. Gait speed is decreased but functional for full household ambulation. Pt demonstrates safe use of rolling walker and denies DOE with ambulation   Stairs            Wheelchair Mobility    Modified Rankin (Stroke Patients Only)       Balance Overall balance assessment: Needs assistance Sitting-balance support: No upper extremity supported Sitting balance-[name]: Good     Standing balance support: No upper extremity supported Standing balance-[name]: Fair Standing balance comment: Able to balance in standing without UE support                            Cognition Arousal/Alertness: Awake/alert Behavior During Therapy: WFL for tasks assessed/performed Overall Cognitive Status: Within Functional Limits for tasks assessed                                        Exercises General Exercises - Lower Extremity Long  Arc Quad: Strengthening;Both;15 reps;Seated Heel Slides: Strengthening;Both;15 reps;Seated Hip ABduction/ADduction: Strengthening;Both;15 reps;Seated Straight Leg Raises: Strengthening;Both;Supine;15 reps Hip Flexion/Marching: Strengthening;Both;15 reps;Seated Heel Raises: Strengthening;Both;10 reps;Seated Other Exercises Other Exercises: Sit to stand without UE support x 5    General Comments        Pertinent Vitals/Pain Pain  Assessment: 0-10 Pain Score: 8  Pain Location: L abdomen Pain Intervention(s): Monitored during session    Home Living                      Prior Function            PT Goals (current goals can now be found in the care plan section) Acute Rehab PT Goals Patient Stated Goal: To return home  PT Goal Formulation: With patient Time For Goal Achievement: 07/09/17 Potential to Achieve Goals: Good Progress towards PT goals: Progressing toward goals    Frequency    Min 2X/week      PT Plan Current plan remains appropriate    Co-evaluation              AM-PAC PT "6 Clicks" Daily Activity  Outcome Measure  Difficulty turning over in bed (including adjusting bedclothes, sheets and blankets)?: A Little Difficulty moving from lying on back to sitting on the side of the bed? : A Little Difficulty sitting down on and standing up from a chair with arms (e.g., wheelchair, bedside commode, etc,.)?: A Little Help needed moving to and from a bed to chair (including a wheelchair)?: A Little Help needed walking in [name] room?: A Little Help needed climbing 3-5 steps with a railing? : A Little 6 Click Score: 18    End of Session Equipment Utilized During Treatment: Gait belt Activity Tolerance: Patient tolerated treatment well Patient left: in bed;with bed alarm set;with call bell/phone within reach;with family/visitor present Nurse Communication: Mobility status PT Visit Diagnosis: Difficulty in walking, not elsewhere classified (R26.2);Muscle weakness (generalized) (M62.81)     Time: 0814-4818 PT Time Calculation (min) (ACUTE ONLY): 26 min  Charges:  $Gait Training: 8-22 mins $Therapeutic Exercise: 8-22 mins                    G Codes:       Lyndel Safe Heith Haigler PT, DPT     Anwar Crill 06/26/2017, 5:18 PM

## 2017-06-27 LAB — BASIC METABOLIC PANEL
Anion gap: 9 (ref 5–15)
BUN: 18 mg/dL (ref 6–20)
CO2: 27 mmol/L (ref 22–32)
Calcium: 9.1 mg/dL (ref 8.9–10.3)
Chloride: 103 mmol/L (ref 101–111)
Creatinine, Ser: 0.79 mg/dL (ref 0.44–1.00)
GFR calc Af Amer: >60 mL/min (ref >60)
GFR calc non Af Amer: >60 mL/min (ref >60)
Glucose, Bld: 107 mg/dL — ABNORMAL HIGH (ref 65–99)
Potassium: 4.6 mmol/L (ref 3.5–5.1)
Sodium: 139 mmol/L (ref 135–145)

## 2017-06-27 LAB — CBC
HCT: 30.6 % — ABNORMAL LOW (ref 35.0–47.0)
Hemoglobin: 10.3 g/dL — ABNORMAL LOW (ref 12.0–16.0)
MCH: 29.7 pg (ref 26.0–34.0)
MCHC: 33.5 g/dL (ref 32.0–36.0)
MCV: 88.6 fL (ref 80.0–100.0)
Platelets: 372 10*3/uL (ref 150–440)
RBC: 3.46 MIL/uL — ABNORMAL LOW (ref 3.80–5.20)
RDW: 14.9 % — ABNORMAL HIGH (ref 11.5–14.5)
WBC: 7.8 10*3/uL (ref 3.6–11.0)

## 2017-06-27 LAB — PROTIME-INR
INR: 1.53
Prothrombin Time: 18.3 seconds — ABNORMAL HIGH (ref 11.4–15.2)

## 2017-06-27 LAB — HEPARIN LEVEL (UNFRACTIONATED): Heparin Unfractionated: 0.5 IU/mL (ref 0.30–0.70)

## 2017-06-27 MED ORDER — MAGNESIUM CITRATE PO SOLN
1.0000 | Freq: Once | ORAL | Status: AC
  Administered 2017-06-27: 1 via ORAL
  Filled 2017-06-27: qty 296

## 2017-06-27 MED ORDER — APIXABAN 5 MG PO TABS
5.0000 mg | ORAL_TABLET | Freq: Two times a day (BID) | ORAL | Status: DC
  Administered 2017-06-27 – 2017-06-28 (×3): 5 mg via ORAL
  Filled 2017-06-27 (×3): qty 1

## 2017-06-27 NOTE — Progress Notes (Addendum)
06/27/17 Addendum:  Spoke with Case Manager and have been informed that the patient would be able to have eliquis free of charge for anticoagulation management.  Given this, will transition the patient today to Eliquis with Pharmacy's assistance.  Plan would be to discharge the patient on Eliquis, possibly tomorrow.  Olean Ree, MD    06/27/2017  Subjective: No acute events overnight.  Given Miralax and suppository with only very small smear of BM.  Reports having intermittent low abdominal pain. But denies any nausea or vomiting.  Vital signs: Temp:  [98.2 F (36.8 C)-99 F (37.2 C)] 98.3 F (36.8 C) (09/05 0429) Pulse Rate:  [80-96] 80 (09/05 0429) Resp:  [16-20] 20 (09/05 0429) BP: (98-116)/(55-61) 98/55 (09/05 0429) SpO2:  [95 %-98 %] 95 % (09/05 0429)   Intake/Output: 09/04 0701 - 09/05 0700 In: 1035.3 [P.O.:720; I.V.:315.3] Out: 3150 [Urine:3150] Last BM Date: 06/26/17  Physical Exam: Constitutional: No acute distress Abdomen:  Soft, obese, with mild discomfort in the lower abdomen to palpation. No peritoneal signs.  Labs:   Recent Labs  06/26/17 0433 06/27/17 0416  WBC 6.6 7.8  HGB 9.8* 10.3*  HCT 28.8* 30.6*  PLT 305 372    Recent Labs  06/26/17 0433 06/27/17 0416  NA 139 139  K 4.0 4.6  CL 104 103  CO2 27 27  GLUCOSE 111* 107*  BUN 15 18  CREATININE 0.79 0.79  CALCIUM 8.7* 9.1    Recent Labs  06/26/17 0433 06/27/17 0416  LABPROT 14.8 18.3*  INR 1.17 1.53    Imaging: No results found.  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  -Continue IV heparin drip. INR today is 1.53. Continue Coumadin per pharmacy. -We'll start magnesium citrate today to help the patient have a bowel movement. This may be contributing to her lower abdominal discomfort. -Physical therapy evaluated the patient yesterday again and recommended home health PT. Appreciate their input. We'll discuss with  case manager and social worker for hopefully discharged tomorrow depending on her INR level and bowel function.   Melvyn Neth, [place]

## 2017-06-27 NOTE — Care Management (Signed)
Patient in agreement to home health PT services if approved by the [facility]. Request submitted for a total of $585 to pay for 3 visits.  Patient will requires [facility] at discharge.  RNCM following.

## 2017-06-27 NOTE — Care Management (Signed)
Patient states that she wants to continue her care with her PCP Dr. Carlene Coria at [facility].  She confirms that she does not want to change to a local PCP.  RNCM spoke with Cyril Mourning at [facility].  Patient can obtain her medications from the clinic even though her PCP is not in [facility].

## 2017-06-27 NOTE — Care Management (Addendum)
Notified by Norberto Sorenson that patient had requested to speak to someone regarding applying for Medicaid.  I spoke with Trecia Rogers in the financial counseling office who stated that she met with the patient on 06/20/17.  Per Amy due to patient not being eligible for for disability, she would also not qualify for Medicaid at this time.  Amy ask that I provide patient with her number, in case the patient no longer has her business card and has further questions.  Updated patient and phone number provided.

## 2017-06-27 NOTE — Progress Notes (Signed)
ANTICOAGULATION CONSULT NOTE - Initial Consult  Pharmacy Consult for Apixaban  Indication: mesenteric ischemia, SMA Thrombosis   No Known Allergies  Patient Measurements: Height: 5\' 2"  (157.5 cm) Weight: 225 lb (102.1 kg) IBW/kg (Calculated) : 50.1 Vital Signs: Temp: 98.3 F (36.8 C) (09/05 0429) Temp Source: Oral (09/05 0429) BP: 98/55 (09/05 0429) Pulse Rate: 80 (09/05 0429)  Labs:  Recent Labs  06/25/17 0259 06/26/17 0433 06/27/17 0416  HGB 9.9* 9.8* 10.3*  HCT 29.6* 28.8* 30.6*  PLT 288 305 372  LABPROT 12.9 14.8 18.3*  INR 0.98 1.17 1.53  HEPARINUNFRC 0.44 0.50 0.50  CREATININE 0.62 0.79 0.79    Estimated Creatinine Clearance: 86.8 mL/min (by C-G formula based on SCr of 0.79 mg/dL).   Medical History: Past Medical History:  Diagnosis Date  . Abdominal aortic atherosclerosis ([place])    a. 05/2017 CTA abd/pelvis: significant atherosclerotic dzs of the infrarenal abd Ao w/ some mural thrombus. No aneurysm or dissection.  . Baker's cyst of knee, right    a. 07/2016 U/S: 4.1 x 1.4 x 2.9 cystic structure in R poplitetal fossa.  . Bell's palsy   . Chest pain    a. 08/2012 Lexiscan MV: EF 54%, non ischemia/infarct.  . Diastolic dysfunction    a. 05/2017 Echo: Ef 60-65%, no rwma, Gr1 DD, no source of cardiac emboli.  . 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 05/2017 CTA Abd/pelvis: apparent thrombus or embolus in prox SMA (70-90%); b. 05/2017 catheter directed tPA, mechanical thrombectomy, and stenting of the SMA.  [name] GERD (gastroesophageal reflux disease)   . Hyperlipidemia   . Hypertension   . Morbid obesity with BMI of 40.0-44.9, adult [facility])     Assessment: Pharmacy consulted for apixaban dosing in 56 yo female with mesenteric ischemia/SMA Thrombosis.   Plan:  After discussion with Dr. Hampton Abbot- Patient will be started on apixaban 5mg  BID. Heparin drip has been discontinued.   Pernell Dupre, PharmD, BCPS Clinical Pharmacist 06/27/2017 11:08  AM

## 2017-06-28 MED ORDER — APIXABAN 5 MG PO TABS: 5.0000 mg | ORAL_TABLET | Freq: Two times a day (BID) | ORAL | 5 refills | Status: DC

## 2017-06-28 MED ORDER — POLYETHYLENE GLYCOL 3350 17 G PO PACK: 17.0000 g | PACK | Freq: Two times a day (BID) | ORAL | 0 refills | Status: DC

## 2017-06-28 MED ORDER — OXYCODONE HCL 5 MG PO TABS
5.0000 mg | ORAL_TABLET | ORAL | 0 refills | Status: DC | PRN
Start: 2017-06-28 — End: 2017-08-02

## 2017-06-28 MED ORDER — ALPRAZOLAM 0.5 MG PO TABS: 0.5000 mg | ORAL_TABLET | Freq: Two times a day (BID) | ORAL | 0 refills | Status: DC | PRN

## 2017-06-28 NOTE — Progress Notes (Signed)
Pt discharged per MD order. Discharge paperwork reviewed with pt. Questions answered to satisfaction. Prescriptions given to pt. Pt taken to car via wheelchair.

## 2017-06-28 NOTE — Discharge Summary (Signed)
Patient ID: Carla [name] MRN: [id number] DOB/AGE: 06-12-1961 56 y.o.  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  She was taken to IR with Dr. Lucky Cowboy from vascular surgery and underwent SMA thrombectomy and stent placement.  Post-operatively, she was started on a Heparin drip for anticoagulation.  Her diet was slowly advanced.  She was tolerating a regular diet and was transitioned to Coumadin therapy with Pharmacy assistance.  Case Management team was involved in her care and was able to set her up with free Eliquis therapy.  The thought was that this would be better so she did not have to go through frequent INR checks and dose changes.  The patient agreed to the Eliquis therapy.  She was also constipated and had not had a bowel movement since admission and was having crampy abdominal discomfort in the low abdomen as a result.  She was started on Miralax and also magnesium citrate and had a bowel movement on 9/5.  PT evaluated the patient and recommended home health PT.  A bedside commode was also provided.  She was deemed ready for discharge to home.  Consults:  Vascular surgery, Case Management, Physical Therapy, Internal Medicine  Disposition: 01-Home or Self Care  Discharge Instructions    Call MD for:  difficulty breathing, headache or visual disturbances    Complete by:  As directed    Call MD for:  persistant nausea and vomiting    Complete by:  As directed    Call MD for:  redness, tenderness, or signs of infection (pain, swelling, redness, odor or  green/yellow discharge around incision site)    Complete by:  As directed    Call MD for:  severe uncontrolled pain    Complete by:  As directed    Call MD for:  temperature >100.4    Complete by:  As directed    Diet - low sodium heart healthy    Complete by:  As directed    Discharge instructions    Complete by:  As directed    1.  Take Eliquis as indicated 2.  Continue Miralax as needed for constipation.   Driving Restrictions    Complete by:  As directed    Do not drive while taking narcotics for pain control.   Increase activity slowly    Complete by:  As directed      Allergies as of 06/28/2017   No Known Allergies     Medication List    TAKE these medications   ALPRAZolam 0.5 MG tablet Commonly known as:  XANAX Take 1 tablet (0.5 mg total) by mouth 2 (two) times daily as needed for anxiety.   apixaban 5 MG Tabs tablet Commonly known as:  ELIQUIS Take 1 tablet (5 mg total) by mouth 2 (two) times daily.   aspirin EC 81 MG tablet Take 81 mg by mouth daily.   atorvastatin 80 MG tablet Commonly known as:  LIPITOR Take 80 mg by mouth at bedtime.   gabapentin 300 MG capsule Commonly known as:  NEURONTIN Take 300 mg by mouth 2 (  two) times daily.   lisinopril 40 MG tablet Commonly known as:  PRINIVIL,ZESTRIL Take 40 mg by mouth daily.   metoprolol tartrate 25 MG tablet Commonly known as:  LOPRESSOR Take 25 mg by mouth 2 (two) times daily.   omeprazole 20 MG capsule Commonly known as:  PRILOSEC Take 20 mg by mouth 2 (two) times daily.   oxyCODONE 5 MG immediate release tablet Commonly known as:  Oxy IR/ROXICODONE Take 1 tablet (5 mg total) by mouth every 4 (four) hours as needed for moderate pain or severe pain.   polyethylene glycol packet Commonly known as:  MIRALAX / GLYCOLAX Take 17 g by mouth 2 (two) times daily.   traMADol 50 MG tablet Commonly known as:  ULTRAM Take 50 mg by mouth every 6 (six) hours as needed for moderate pain.             Durable Medical Equipment        Start     Ordered   06/28/17 1006  For home use only DME Bedside commode  Once    Question:  Patient needs a bedside commode to treat with the following condition  Answer:  Physical deconditioning   06/28/17 1005       Discharge Care Instructions        Start     Ordered   06/28/17 0000  ALPRAZolam (XANAX) 0.5 MG tablet  2 times daily PRN     06/28/17 0955   06/28/17 0000  apixaban (ELIQUIS) 5 MG TABS tablet  2 times daily     06/28/17 0955   06/28/17 0000  oxyCODONE (OXY IR/ROXICODONE) 5 MG immediate release tablet  Every 4 hours PRN     06/28/17 0955   06/28/17 0000  polyethylene glycol (MIRALAX / GLYCOLAX) packet  2 times daily     06/28/17 0955   06/28/17 0000  Diet - low sodium heart healthy     06/28/17 0955   06/28/17 0000  Increase activity slowly     06/28/17 0955   06/28/17 0000  Driving Restrictions    Comments:  Do not drive while taking narcotics for pain control.   06/28/17 0955   06/28/17 0000  Call MD for:  temperature >100.4     06/28/17 0955   06/28/17 0000  Call MD for:  persistant nausea and vomiting     06/28/17 0955   06/28/17 0000  Call MD for:  severe uncontrolled pain     06/28/17 0955   06/28/17 0000  Call MD for:  redness, tenderness, or signs of infection (pain, swelling, redness, odor or green/yellow discharge around incision site)     06/28/17 0955   06/28/17 0000  Call MD for:  difficulty breathing, headache or visual disturbances     06/28/17 0955   06/28/17 0000  Discharge instructions    Comments:  1.  Take Eliquis as indicated 2.  Continue Miralax as needed for constipation.   06/28/17 0955     Follow-up Information    Olean Ree, MD Follow up in 2 week(s).   Specialty:  Surgery Contact information: [place] [address] [phone]        Algernon Huxley, MD Follow up in 2 week(s).   Specialties:  Vascular Surgery, Radiology, Interventional  Cardiology Contact information: [place] [address] [phone]

## 2017-06-28 NOTE — Progress Notes (Signed)
[facility]  Patient Status: Home address: [address] [place], [place] [address] [place] 06/28/2017, 12:04 PM

## 2017-06-28 NOTE — Care Management (Signed)
Patient to discharge home today.  Eliquis rx faxed to [facility].  Patient to pick up rx at discharge. Patient provided with [employer] for "Free and low [place] in [place]". Patient also provided application to [facility] and [facility].  Request for $585 has been approved for [facility] PT by [place] [facility].  Referral has been made to [facility] with [place].  BSC to be delivered prior to discharge.  RNCM signing off.

## 2017-06-29 ENCOUNTER — Telehealth: Payer: Self-pay | Admitting: Pharmacy Technician

## 2017-06-29 ENCOUNTER — Telehealth: Payer: Self-pay

## 2017-06-29 NOTE — Telephone Encounter (Signed)
Patient provided with new patient packet.  [place] [facility]

## 2017-06-29 NOTE — Telephone Encounter (Signed)
Hospital Follow-up call made to patient at this time. Spoke with Sabino Snipes. Hospital Follow-up interview questions below.  1. How are you feeling? Having some pain can't walk and be up for no more than 5 minutes at a time without feeling nausea    2. Is your pain controlled? Yes  3. What are you doing for the pain? Taking oxycodone   4. Are you having any Nausea or Vomiting? Having nausea  5. Are you having any Fever or Chills? Not fever having but for the last couple of days has been having some hot flashes and server sweats that cause her to drip  6. Are you having any Constipation or Diarrhea? had a small bowl movement this morning but still having some discomfort and cramping    7. Do you have any questions or concerns at this time? None   Discussion: Changed patients appointment to 9/14 at 10AM with Dr. Hampton Abbot. Patient verbalized understanding at this time

## 2017-06-30 ENCOUNTER — Ambulatory Visit (INDEPENDENT_AMBULATORY_CARE_PROVIDER_SITE_OTHER): Payer: Self-pay

## 2017-06-30 DIAGNOSIS — R002 Palpitations: Secondary | ICD-10-CM

## 2017-07-02 ENCOUNTER — Ambulatory Visit: Payer: Self-pay | Admitting: Pharmacy Technician

## 2017-07-02 DIAGNOSIS — Z79899 Other long term (current) drug therapy: Secondary | ICD-10-CM

## 2017-07-02 NOTE — Progress Notes (Signed)
Completed [facility] application and contract.  Patient agreed to all terms of the [facility] contract.  Patient approved to receive medication assistance through 2018 at [facility], as long as eligibility continues to be met.  Provided patient with community resource material based on her particular needs.    Eliquis Prescription Application completed with patient.  Forwarded to Dr. Awilda Metro for signature.  Upon receipt of signed application from provider and, Eliquis Prescription Application will be submitted to [place].  [facility]

## 2017-07-05 ENCOUNTER — Encounter: Payer: Self-pay | Admitting: Surgery

## 2017-07-06 ENCOUNTER — Encounter: Payer: Self-pay | Admitting: Surgery

## 2017-07-09 IMAGING — MG MM DIGITAL SCREENING BILAT W/ TOMO W/ CAD
8 of 12 series · 8 of 28 positions shown · non-contrast
Comparison: Previous exam(s).

CLINICAL DATA: Screening.

EXAM:
2D DIGITAL SCREENING BILATERAL MAMMOGRAM WITH CAD AND ADJUNCT TOMO

[R MLO]
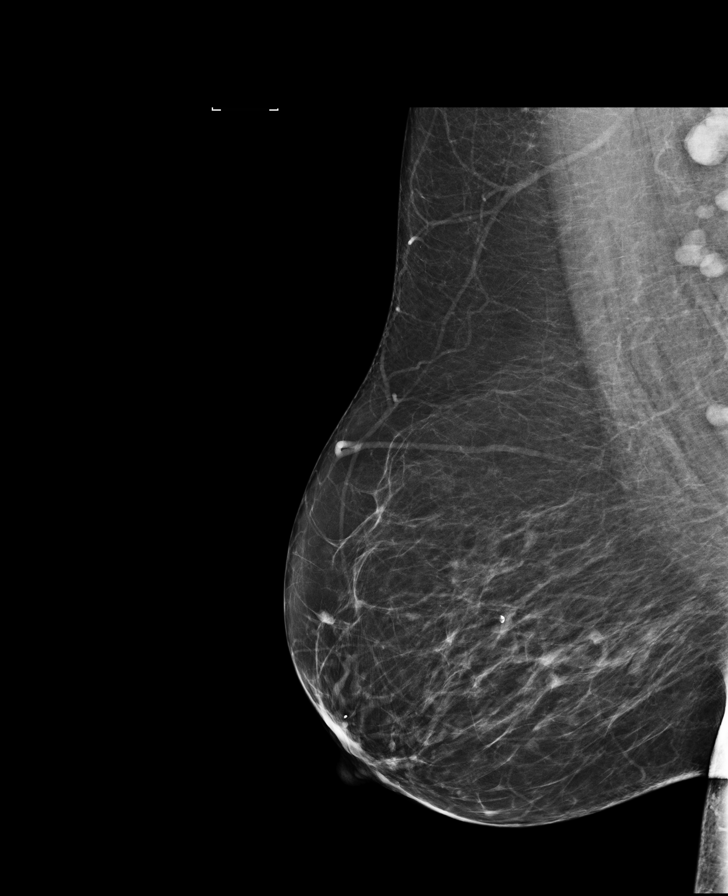

[L MLO]
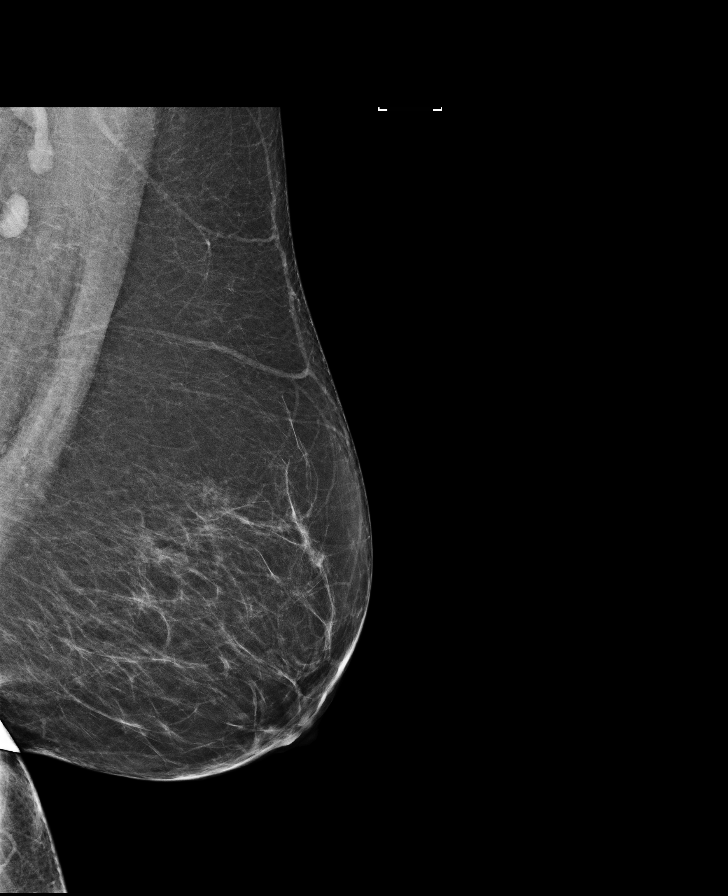

[L CC synth-2D]
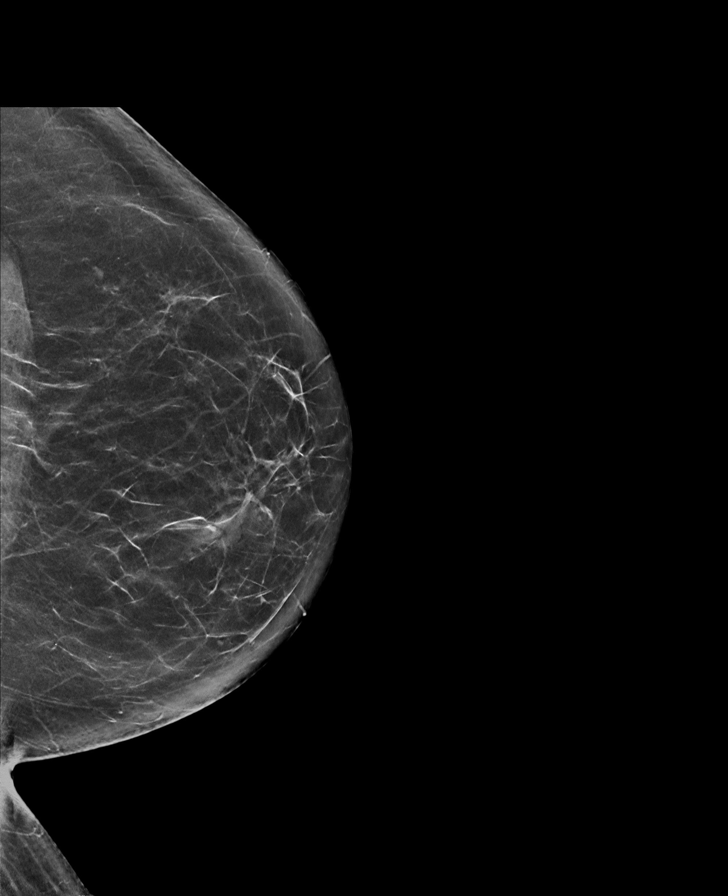

[L CC]
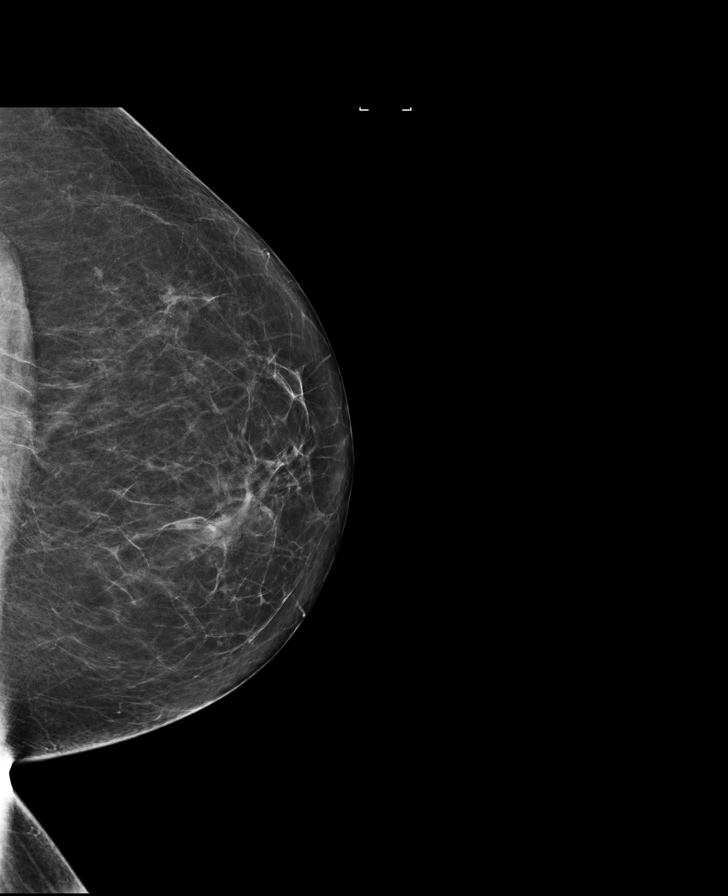

[R CC]
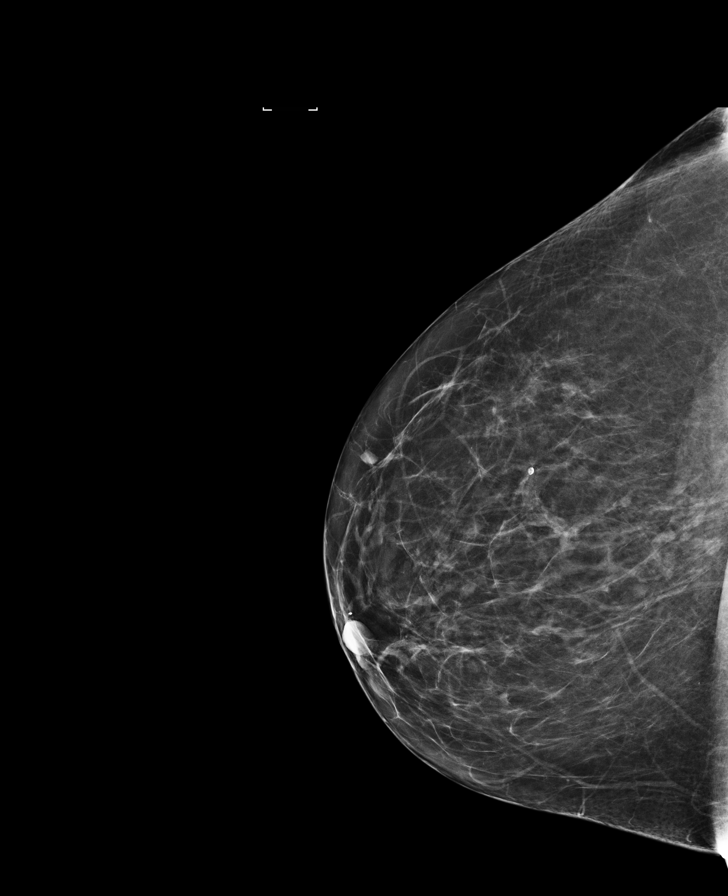

[R CC synth-2D]
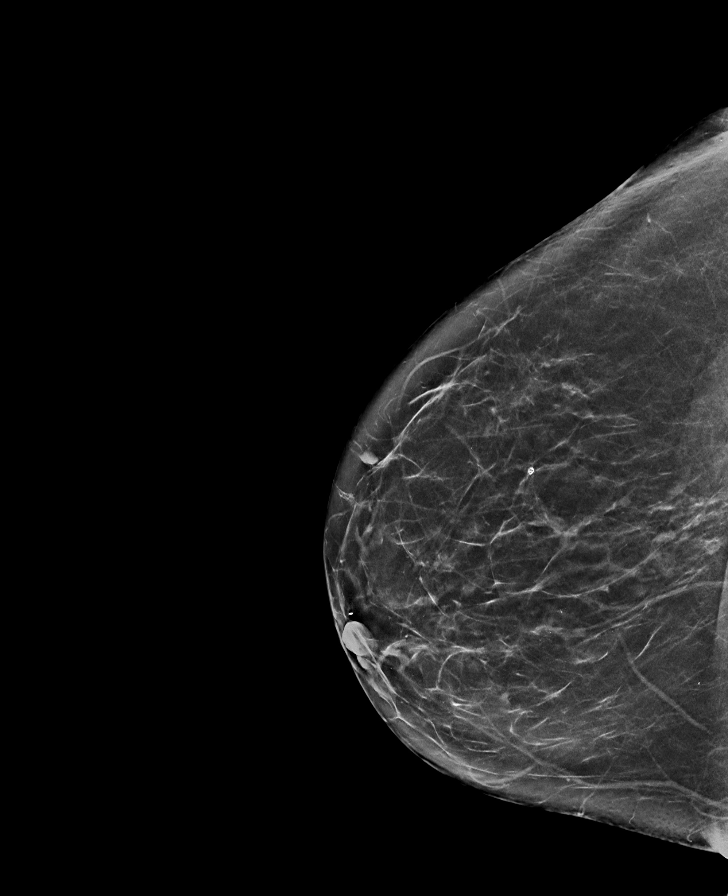

[R MLO synth-2D]
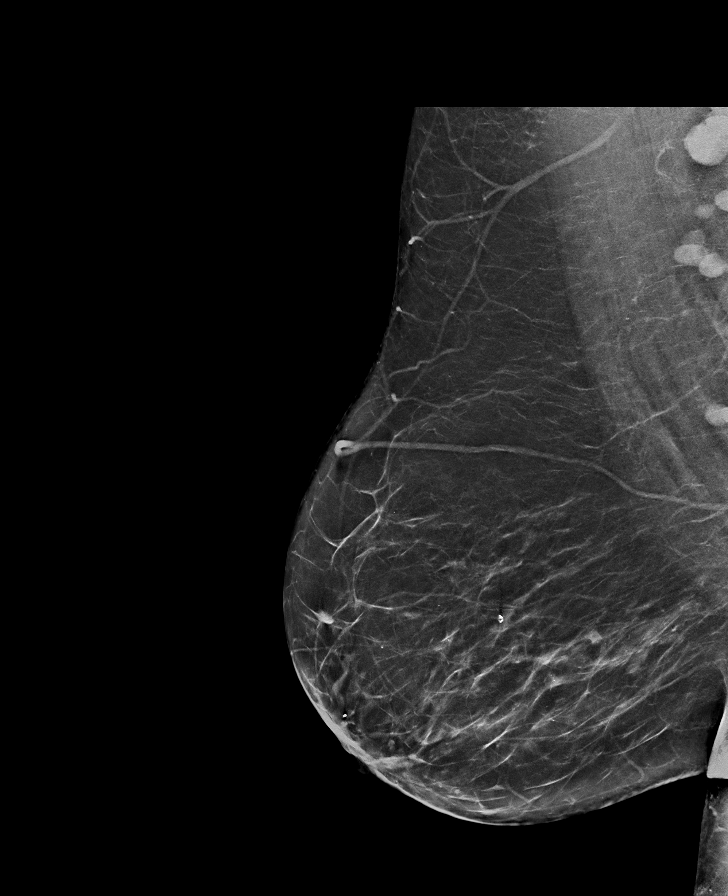

[L MLO synth-2D]
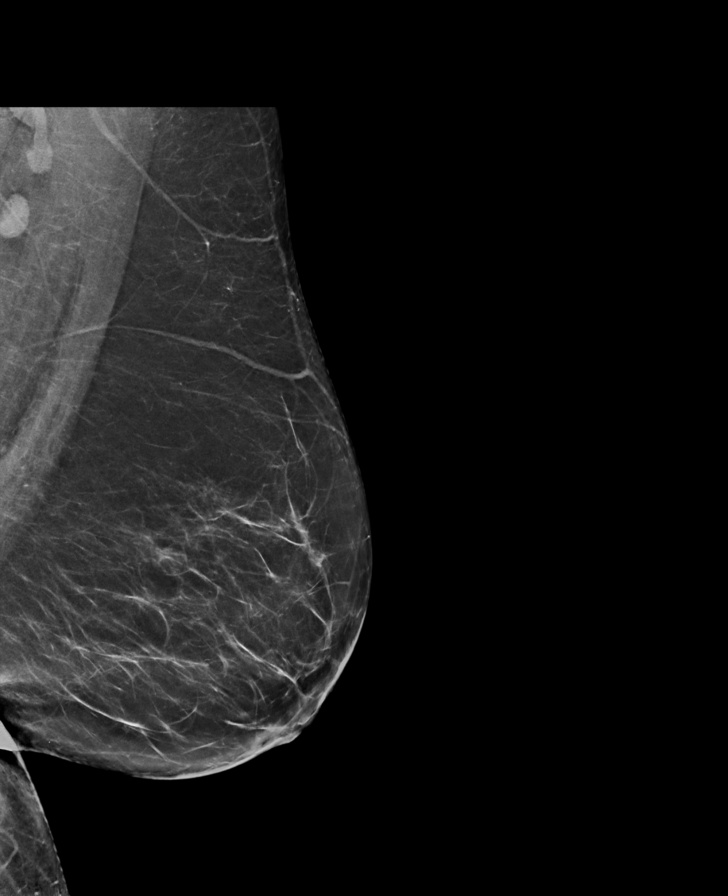

[8 of 28 positions shown; findings below may reference images not displayed]

ACR Breast Density Category b: There are scattered areas of
fibroglandular density.
FINDINGS: There are no findings suspicious for malignancy. Images were
processed with CAD.
IMPRESSION: No mammographic evidence of malignancy. A result letter of this
screening mammogram will be mailed directly to the patient.

RECOMMENDATION:
Screening mammogram in one year. (Code:97-6-RS4)

BI-RADS CATEGORY  1: Negative.

## 2017-07-13 ENCOUNTER — Ambulatory Visit (INDEPENDENT_AMBULATORY_CARE_PROVIDER_SITE_OTHER): Payer: MEDICAID | Admitting: Vascular Surgery

## 2017-07-16 ENCOUNTER — Encounter: Payer: Self-pay | Admitting: Surgery

## 2017-07-16 ENCOUNTER — Ambulatory Visit (INDEPENDENT_AMBULATORY_CARE_PROVIDER_SITE_OTHER): Payer: MEDICAID | Admitting: Vascular Surgery

## 2017-07-19 ENCOUNTER — Encounter: Payer: Self-pay | Admitting: Pharmacist

## 2017-07-25 ENCOUNTER — Telehealth: Payer: Self-pay | Admitting: Nurse Practitioner

## 2017-07-25 NOTE — Telephone Encounter (Signed)
Spoke with patient to check on the event monitor which was ordered for her. She reports that she wore it some and then her mother passed away unexpectedly. She has not worn it since then but she still has it with her. Instructed her to please try and resume wearing it and then mail it in when finished. She verbalized understanding with no further questions at this time.

## 2017-07-29 ENCOUNTER — Inpatient Hospital Stay
Admission: EM | Admit: 2017-07-29 | Discharge: 2017-08-02 | DRG: 394 | Disposition: A | Discharge status: Home / Self Care | Payer: Medicaid Other | Attending: Internal Medicine | Admitting: Internal Medicine

## 2017-07-29 ENCOUNTER — Encounter: Payer: Self-pay | Admitting: Emergency Medicine

## 2017-07-29 ENCOUNTER — Inpatient Hospital Stay: Payer: Medicaid Other

## 2017-07-29 DIAGNOSIS — R1084 Generalized abdominal pain: Secondary | ICD-10-CM | POA: Diagnosis present

## 2017-07-29 DIAGNOSIS — K5909 Other constipation: Secondary | ICD-10-CM | POA: Diagnosis present

## 2017-07-29 DIAGNOSIS — Z79899 Other long term (current) drug therapy: Secondary | ICD-10-CM

## 2017-07-29 DIAGNOSIS — N390 Urinary tract infection, site not specified: Secondary | ICD-10-CM | POA: Diagnosis present

## 2017-07-29 DIAGNOSIS — B961 Klebsiella pneumoniae [K. pneumoniae] as the cause of diseases classified elsewhere: Secondary | ICD-10-CM | POA: Diagnosis present

## 2017-07-29 DIAGNOSIS — Z6841 Body Mass Index (BMI) 40.0 and over, adult: Secondary | ICD-10-CM | POA: Diagnosis not present

## 2017-07-29 DIAGNOSIS — E861 Hypovolemia: Secondary | ICD-10-CM | POA: Diagnosis present

## 2017-07-29 DIAGNOSIS — G8929 Other chronic pain: Secondary | ICD-10-CM | POA: Diagnosis present

## 2017-07-29 DIAGNOSIS — I959 Hypotension, unspecified: Secondary | ICD-10-CM

## 2017-07-29 DIAGNOSIS — N179 Acute kidney failure, unspecified: Secondary | ICD-10-CM | POA: Diagnosis present

## 2017-07-29 DIAGNOSIS — M170 Bilateral primary osteoarthritis of knee: Secondary | ICD-10-CM | POA: Diagnosis present

## 2017-07-29 DIAGNOSIS — I2510 Atherosclerotic heart disease of native coronary artery without angina pectoris: Secondary | ICD-10-CM | POA: Diagnosis present

## 2017-07-29 DIAGNOSIS — I119 Hypertensive heart disease without heart failure: Secondary | ICD-10-CM | POA: Diagnosis present

## 2017-07-29 DIAGNOSIS — K219 Gastro-esophageal reflux disease without esophagitis: Secondary | ICD-10-CM | POA: Diagnosis present

## 2017-07-29 DIAGNOSIS — K55069 Acute infarction of intestine, part and extent unspecified: Secondary | ICD-10-CM | POA: Diagnosis present

## 2017-07-29 DIAGNOSIS — E785 Hyperlipidemia, unspecified: Secondary | ICD-10-CM | POA: Diagnosis present

## 2017-07-29 DIAGNOSIS — T464X5A Adverse effect of angiotensin-converting-enzyme inhibitors, initial encounter: Secondary | ICD-10-CM | POA: Diagnosis present

## 2017-07-29 DIAGNOSIS — K551 Chronic vascular disorders of intestine: Principal | ICD-10-CM | POA: Diagnosis present

## 2017-07-29 DIAGNOSIS — I5189 Other ill-defined heart diseases: Secondary | ICD-10-CM | POA: Diagnosis present

## 2017-07-29 DIAGNOSIS — R109 Unspecified abdominal pain: Secondary | ICD-10-CM

## 2017-07-29 DIAGNOSIS — F17210 Nicotine dependence, cigarettes, uncomplicated: Secondary | ICD-10-CM | POA: Diagnosis present

## 2017-07-29 DIAGNOSIS — Z7901 Long term (current) use of anticoagulants: Secondary | ICD-10-CM

## 2017-07-29 DIAGNOSIS — K5904 Chronic idiopathic constipation: Secondary | ICD-10-CM

## 2017-07-29 HISTORY — DX: Acute kidney failure, unspecified: N17.9

## 2017-07-29 HISTORY — DX: Malignant (primary) neoplasm, unspecified: C80.1

## 2017-07-29 HISTORY — DX: Hypotension, unspecified: I95.9

## 2017-07-29 LAB — URINE DRUG SCREEN, QUALITATIVE (ARMC ONLY)
Amphetamines, Ur Screen: NOT DETECTED
Barbiturates, Ur Screen: NOT DETECTED
Benzodiazepine, Ur Scrn: NOT DETECTED
Cannabinoid 50 Ng, Ur ~~LOC~~: NOT DETECTED
Cocaine Metabolite,Ur ~~LOC~~: NOT DETECTED
MDMA (Ecstasy)Ur Screen: NOT DETECTED
Methadone Scn, Ur: NOT DETECTED
Opiate, Ur Screen: NOT DETECTED
Phencyclidine (PCP) Ur S: NOT DETECTED
Tricyclic, Ur Screen: NOT DETECTED

## 2017-07-29 LAB — COMPREHENSIVE METABOLIC PANEL
ALT: 13 U/L — ABNORMAL LOW (ref 14–54)
AST: 16 U/L (ref 15–41)
Albumin: 3.3 g/dL — ABNORMAL LOW (ref 3.5–5.0)
Alkaline Phosphatase: 88 U/L (ref 38–126)
Anion gap: 9 (ref 5–15)
BUN: 21 mg/dL — ABNORMAL HIGH (ref 6–20)
CO2: 23 mmol/L (ref 22–32)
Calcium: 8.7 mg/dL — ABNORMAL LOW (ref 8.9–10.3)
Chloride: 105 mmol/L (ref 101–111)
Creatinine, Ser: 1.73 mg/dL — ABNORMAL HIGH (ref 0.44–1.00)
GFR calc Af Amer: 37 mL/min — ABNORMAL LOW (ref >60)
GFR calc non Af Amer: 32 mL/min — ABNORMAL LOW (ref >60)
Glucose, Bld: 116 mg/dL — ABNORMAL HIGH (ref 65–99)
Potassium: 4.2 mmol/L (ref 3.5–5.1)
Sodium: 137 mmol/L (ref 135–145)
Total Bilirubin: 0.4 mg/dL (ref 0.3–1.2)
Total Protein: 6.7 g/dL (ref 6.5–8.1)

## 2017-07-29 LAB — CBC
HCT: 33 % — ABNORMAL LOW (ref 35.0–47.0)
Hemoglobin: 11.4 g/dL — ABNORMAL LOW (ref 12.0–16.0)
MCH: 30.8 pg (ref 26.0–34.0)
MCHC: 34.6 g/dL (ref 32.0–36.0)
MCV: 88.8 fL (ref 80.0–100.0)
Platelets: 255 10*3/uL (ref 150–440)
RBC: 3.72 MIL/uL — ABNORMAL LOW (ref 3.80–5.20)
RDW: 15.4 % — ABNORMAL HIGH (ref 11.5–14.5)
WBC: 4.9 10*3/uL (ref 3.6–11.0)

## 2017-07-29 LAB — URINALYSIS, ROUTINE W REFLEX MICROSCOPIC
Bilirubin Urine: NEGATIVE
Glucose, UA: NEGATIVE mg/dL
Hgb urine dipstick: NEGATIVE
Ketones, ur: NEGATIVE mg/dL
Nitrite: NEGATIVE
Protein, ur: NEGATIVE mg/dL
Specific Gravity, Urine: 1.013 (ref 1.005–1.030)
pH: 5 (ref 5.0–8.0)

## 2017-07-29 LAB — LACTIC ACID, PLASMA: Lactic Acid, Venous: 0.8 mmol/L (ref 0.5–1.9)

## 2017-07-29 LAB — SEDIMENTATION RATE: Sed Rate: 49 mm/hr — ABNORMAL HIGH (ref 0–30)

## 2017-07-29 LAB — LIPASE, BLOOD: Lipase: 35 U/L (ref 11–51)

## 2017-07-29 MED ORDER — OXYCODONE HCL 5 MG PO TABS
5.0000 mg | ORAL_TABLET | Freq: Four times a day (QID) | ORAL | Status: DC | PRN
  Administered 2017-07-29 – 2017-08-02 (×9): 5 mg via ORAL
  Filled 2017-07-29 (×9): qty 1

## 2017-07-29 MED ORDER — NICOTINE 14 MG/24HR TD PT24
14.0000 mg | MEDICATED_PATCH | Freq: Every day | TRANSDERMAL | Status: DC
  Administered 2017-07-29 – 2017-08-01 (×3): 14 mg via TRANSDERMAL
  Filled 2017-07-29 (×4): qty 1

## 2017-07-29 MED ORDER — FENTANYL CITRATE (PF) 100 MCG/2ML IJ SOLN
50.0000 ug | Freq: Once | INTRAMUSCULAR | Status: AC
Start: 2017-07-29 — End: 2017-07-29
  Administered 2017-07-29: 50 ug via INTRAVENOUS
  Filled 2017-07-29: qty 2

## 2017-07-29 MED ORDER — ALPRAZOLAM 0.5 MG PO TABS: 0.5000 mg | ORAL_TABLET | Freq: Two times a day (BID) | ORAL | Status: DC | PRN

## 2017-07-29 MED ORDER — DEXTROSE-NACL 5-0.9 % IV SOLN
INTRAVENOUS | Status: AC
  Administered 2017-07-29 – 2017-07-30 (×3): via INTRAVENOUS

## 2017-07-29 MED ORDER — ATORVASTATIN CALCIUM 20 MG PO TABS
80.0000 mg | ORAL_TABLET | Freq: Every day | ORAL | Status: DC
  Administered 2017-07-29 – 2017-07-31 (×3): 80 mg via ORAL
  Filled 2017-07-29 (×4): qty 4

## 2017-07-29 MED ORDER — POLYETHYLENE GLYCOL 3350 17 G PO PACK
17.0000 g | PACK | Freq: Two times a day (BID) | ORAL | Status: DC
  Administered 2017-07-29 – 2017-07-31 (×3): 17 g via ORAL
  Filled 2017-07-29 (×3): qty 1

## 2017-07-29 MED ORDER — APIXABAN 5 MG PO TABS
5.0000 mg | ORAL_TABLET | Freq: Two times a day (BID) | ORAL | Status: DC
  Administered 2017-07-29 – 2017-08-02 (×8): 5 mg via ORAL
  Filled 2017-07-29 (×7): qty 1
  Filled 2017-07-29: qty 2
  Filled 2017-07-29: qty 1

## 2017-07-29 MED ORDER — ONDANSETRON HCL 4 MG/2ML IJ SOLN
4.0000 mg | Freq: Once | INTRAMUSCULAR | Status: AC
  Administered 2017-07-29: 4 mg via INTRAVENOUS
  Filled 2017-07-29: qty 2

## 2017-07-29 MED ORDER — SODIUM CHLORIDE 0.9 % IV BOLUS (SEPSIS)
1000.0000 mL | Freq: Once | INTRAVENOUS | Status: AC
  Administered 2017-07-29: 1000 mL via INTRAVENOUS

## 2017-07-29 MED ORDER — ONDANSETRON HCL 4 MG/2ML IJ SOLN
4.0000 mg | Freq: Four times a day (QID) | INTRAMUSCULAR | Status: DC | PRN
  Administered 2017-08-01: 4 mg via INTRAVENOUS
  Filled 2017-07-29: qty 2

## 2017-07-29 MED ORDER — GABAPENTIN 300 MG PO CAPS
300.0000 mg | ORAL_CAPSULE | Freq: Two times a day (BID) | ORAL | Status: DC
  Administered 2017-07-29 – 2017-08-02 (×8): 300 mg via ORAL
  Filled 2017-07-29 (×8): qty 1

## 2017-07-29 MED ORDER — ACETAMINOPHEN 325 MG PO TABS
650.0000 mg | ORAL_TABLET | Freq: Four times a day (QID) | ORAL | Status: DC | PRN
  Administered 2017-08-01: 650 mg via ORAL
  Filled 2017-07-29: qty 2

## 2017-07-29 MED ORDER — PANTOPRAZOLE SODIUM 40 MG PO TBEC
40.0000 mg | DELAYED_RELEASE_TABLET | Freq: Every day | ORAL | Status: DC
  Administered 2017-07-30 – 2017-08-02 (×4): 40 mg via ORAL
  Filled 2017-07-29 (×4): qty 1

## 2017-07-29 MED ORDER — ACETAMINOPHEN 650 MG RE SUPP: 650.0000 mg | Freq: Four times a day (QID) | RECTAL | Status: DC | PRN

## 2017-07-29 MED ORDER — OXYBUTYNIN CHLORIDE 5 MG PO TABS
5.0000 mg | ORAL_TABLET | Freq: Three times a day (TID) | ORAL | Status: DC
  Administered 2017-07-29 – 2017-08-02 (×12): 5 mg via ORAL
  Filled 2017-07-29 (×12): qty 1

## 2017-07-29 MED ORDER — ONDANSETRON HCL 4 MG PO TABS
4.0000 mg | ORAL_TABLET | Freq: Four times a day (QID) | ORAL | Status: DC | PRN
  Administered 2017-07-30: 4 mg via ORAL
  Filled 2017-07-29: qty 1

## 2017-07-29 NOTE — ED Notes (Addendum)
Pt states not eating - no weight loss per our records but pt states clothes feel more loose. States she is able to eat "some" but not like she normally does. Generalized low abd pain. This has been going on since previous admission (she states it was going on prior and that was why she was here). States trouble with constipation and ex-lax not working.

## 2017-07-29 NOTE — ED Notes (Signed)
Pt states tried to follow up with dr but that they refused to see her b/c she has no insurance. Believes she was suppose to have a f/u ultrasound to make sure her bowels were okay after the blood clot was removed from her mesenteric artery.

## 2017-07-29 NOTE — ED Triage Notes (Signed)
Pt to ED via POV with c/o intermit lower abd pain. Pt was hospitalized x76month ago for abd blockages and blood clots. Pt states decreased in appetite and same pain. Denies vomiting or diarrhea. Pt A&Ox4, VS stable

## 2017-07-29 NOTE — ED Notes (Signed)
Attempted report - told they havent assigned it yet. Time elapsed 14 minutes. Will call back.

## 2017-07-29 NOTE — H&P (Signed)
[facility] NAME: Carla [name]    MR#:  [id number]  DATE OF BIRTH:  Jan 30, 1961  DATE OF ADMISSION:  07/29/2017  PRIMARY CARE PHYSICIAN: Patient, No Pcp Per   REQUESTING/REFERRING PHYSICIAN:   CHIEF COMPLAINT:   Chief Complaint  Patient presents with  . Abdominal Pain    HISTORY OF PRESENT ILLNESS: Carla [name]  is a 56 y.o. female with below past medical history, discharged early last month for superior mesenteric artery embolism and status post thrombectomy with stent placement by IR, subsequently treated with Eliquis s/p discharge, presents today with continued intermittent lower abdominal pain radiating across her umbilicus since prior to her admission in August of this year, complains of inability to tolerate by mouth, constipation greater than a week despite use of Ex-Lax, in the emergency room patient was found to be hypotensive with systolic blood pressure in the 80s, creatinine 1.5 at baseline normal renal function, low suspicion for recurrence of intra-abdominal thrombosis though cannot be excluded entirely, Hospital services clinic contacted further evaluation/care. Patient violated the bedside emergency room, does not appear to be in any acute distress, resting comfortably in bed, patient is complaining of abdominal pain out of proportion to physical examination, contrast study unable to be done given acute kidney injury, patient denies any fevers, chills, night sweats, weakness, fatigue, emesis, nausea, diarrhea, chest pain, shortness of breath, dysuria, patient now being admitted for acute on chronic abdominal pain, acute kidney injury, and hypotension.   PAST MEDICAL HISTORY:   Past Medical History:  Diagnosis Date  . Abdominal aortic atherosclerosis ([place])    a. 05/2017 CTA abd/pelvis: significant atherosclerotic dzs of the infrarenal abd Ao w/ some mural thrombus. No aneurysm or dissection.  . Baker's cyst of knee, right    a.  07/2016 U/S: 4.1 x 1.4 x 2.9 cystic structure in R poplitetal fossa.  . Bell's palsy   . Chest pain    a. 08/2012 Lexiscan MV: EF 54%, non ischemia/infarct.  . Diastolic dysfunction    a. 05/2017 Echo: Ef 60-65%, no rwma, Gr1 DD, no source of cardiac emboli.  . 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 05/2017 CTA Abd/pelvis: apparent thrombus or embolus in prox SMA (70-90%); b. 05/2017 catheter directed tPA, mechanical thrombectomy, and stenting of the SMA.  [name] GERD (gastroesophageal reflux disease)   . Hyperlipidemia   . Hypertension   . Morbid obesity with BMI of 40.0-44.9, adult ([place])     PAST SURGICAL HISTORY: Past Surgical History:  Procedure Laterality Date  . CHOLECYSTECTOMY    . TEE WITHOUT CARDIOVERSION N/A 06/22/2017   Procedure: TRANSESOPHAGEAL ECHOCARDIOGRAM (TEE);  Surgeon: Wellington Hampshire, MD;  Location: [facility] ORS;  Service: Cardiovascular;  Laterality: N/A;  . VAGINAL HYSTERECTOMY    . VISCERAL ARTERY INTERVENTION N/A 06/20/2017   Procedure: Visceral Artery Intervention, possible aortic thrombectomy;  Surgeon: Algernon Huxley, MD;  Location: [facility];  Service: Cardiovascular;  Laterality: N/A;    SOCIAL HISTORY:  Social History  Substance Use Topics  . Smoking status: Current Some Day Smoker    Packs/day: 0.50    Types: Cigarettes  . Smokeless tobacco: Never Used  . Alcohol use No    FAMILY HISTORY:  Family History  Problem Relation Age of Onset  . Hypertension Mother   . Heart disease Mother   . Heart attack Mother   . Breast cancer Mother   . Hypertension Father   . Breast  cancer Paternal Aunt     DRUG ALLERGIES: No Known Allergies  REVIEW OF SYSTEMS:   CONSTITUTIONAL: No fever, fatigue or weakness.  EYES: No blurred or double vision.  EARS, NOSE, AND THROAT: No tinnitus or ear pain.  RESPIRATORY: No cough, shortness of breath, wheezing or hemoptysis.  CARDIOVASCULAR: No chest pain, orthopnea, edema.  GASTROINTESTINAL: +Acute on  chronic abdominal pain, poor appetite, No nausea, vomiting, diarrhea  GENITOURINARY: No dysuria, hematuria.  ENDOCRINE: No polyuria, nocturia,  HEMATOLOGY: No anemia, easy bruising or bleeding SKIN: No rash or lesion. MUSCULOSKELETAL: No joint pain or arthritis.   NEUROLOGIC: No tingling, numbness, weakness.  PSYCHIATRY: No anxiety or depression.   MEDICATIONS AT HOME:  Prior to Admission medications   Medication Sig Start Date End Date Taking? Authorizing Provider  apixaban (ELIQUIS) 5 MG TABS tablet Take 1 tablet (5 mg total) by mouth 2 (two) times daily. 06/28/17  Yes Piscoya, Jacqulyn Bath, MD  atorvastatin (LIPITOR) 80 MG tablet Take 80 mg by mouth at bedtime.   Yes [provider]  esomeprazole (NEXIUM) 40 MG capsule Take 40 mg by mouth 2 (two) times daily before a meal.    Yes [provider]  gabapentin (NEURONTIN) 300 MG capsule Take 300 mg by mouth 2 (two) times daily.   Yes [provider]  lisinopril (PRINIVIL,ZESTRIL) 40 MG tablet Take 40 mg by mouth daily.   Yes [provider]  metoprolol tartrate (LOPRESSOR) 25 MG tablet Take 50 mg by mouth 2 (two) times daily.    Yes [provider]  oxybutynin (DITROPAN) 5 MG tablet Take 1 tablet by mouth 3 (three) times daily as needed. 06/13/17  Yes [provider]  traMADol (ULTRAM) 50 MG tablet Take 50 mg by mouth every 6 (six) hours as needed for moderate pain.    Yes [provider]  Vitamin D, Ergocalciferol, (DRISDOL) 50000 units CAPS capsule Take 1 capsule by mouth once a week. 06/13/17  Yes [provider]  ALPRAZolam Duanne Moron) 0.5 MG tablet Take 1 tablet (0.5 mg total) by mouth 2 (two) times daily as needed for anxiety. Patient not taking: Reported on 07/29/2017 06/28/17   Olean Ree, MD  oxyCODONE (OXY IR/ROXICODONE) 5 MG immediate release tablet Take 1 tablet (5 mg total) by mouth every 4 (four) hours as needed for moderate pain or severe pain. Patient not taking: Reported  on 07/29/2017 06/28/17   Olean Ree, MD  polyethylene glycol (MIRALAX / GLYCOLAX) packet Take 17 g by mouth 2 (two) times daily. Patient not taking: Reported on 07/29/2017 06/28/17   Olean Ree, MD      PHYSICAL EXAMINATION:   VITAL SIGNS: Blood pressure (!) 87/66, pulse 64, temperature 98.5 F (36.9 C), temperature source Oral, resp. rate 15, height 5\' 4"  (1.626 m), weight 104.8 kg (231 lb), SpO2 97 %.  GENERAL:  56 y.o.-year-old patient lying in the bed with no acute distress.  EYES: Pupils equal, round, reactive to light and accommodation. No scleral icterus. Extraocular muscles intact.  HEENT: Head atraumatic, normocephalic. Oropharynx and nasopharynx clear.  NECK:  Supple, no jugular venous distention. No thyroid enlargement, no tenderness.  LUNGS: Normal breath sounds bilaterally, no wheezing, rales,rhonchi or crepitation. No use of accessory muscles of respiration.  CARDIOVASCULAR: S1, S2 normal. No murmurs, rubs, or gallops.  ABDOMEN: Soft, nontender, nondistended. Bowel sounds present. No organomegaly or mass. No rebound or guarding  EXTREMITIES: No pedal edema, cyanosis, or clubbing.  NEUROLOGIC: Cranial nerves II through XII are intact. Muscle strength 5/5  in all extremities. Sensation intact. Gait not checked.  PSYCHIATRIC: The patient is alert and oriented x 3.  SKIN: No obvious rash, lesion, or ulcer.   LABORATORY PANEL:   CBC  Recent Labs Lab 07/29/17 1131  WBC 4.9  HGB 11.4*  HCT 33.0*  PLT 255  MCV 88.8  MCH 30.8  MCHC 34.6  RDW 15.4*   ------------------------------------------------------------------------------------------------------------------  Chemistries   Recent Labs Lab 07/29/17 1131  NA 137  K 4.2  CL 105  CO2 23  GLUCOSE 116*  BUN 21*  CREATININE 1.73*  CALCIUM 8.7*  AST 16  ALT 13*  ALKPHOS 88  BILITOT 0.4    ------------------------------------------------------------------------------------------------------------------ estimated creatinine clearance is 42.3 mL/min (A) (by C-G formula based on SCr of 1.73 mg/dL (H)). ------------------------------------------------------------------------------------------------------------------ No results for input(s): TSH, T4TOTAL, T3FREE, THYROIDAB in the last 72 hours.  Invalid input(s): FREET3   Coagulation profile No results for input(s): INR, PROTIME in the last 168 hours. ------------------------------------------------------------------------------------------------------------------- No results for input(s): DDIMER in the last 72 hours. -------------------------------------------------------------------------------------------------------------------  Cardiac Enzymes No results for input(s): CKMB, TROPONINI, MYOGLOBIN in the last 168 hours.  Invalid input(s): CK ------------------------------------------------------------------------------------------------------------------ Invalid input(s): POCBNP  ---------------------------------------------------------------------------------------------------------------  Urinalysis    Component Value Date/Time   COLORURINE AMBER (A) 06/19/2017 1307   APPEARANCEUR CLOUDY (A) 06/19/2017 1307   APPEARANCEUR Clear 01/19/2015 1112   LABSPEC 1.029 06/19/2017 1307   LABSPEC 1.019 01/19/2015 1112   PHURINE 5.0 06/19/2017 1307   GLUCOSEU NEGATIVE 06/19/2017 1307   GLUCOSEU Negative 01/19/2015 1112   HGBUR MODERATE (A) 06/19/2017 1307   BILIRUBINUR NEGATIVE 06/19/2017 1307   BILIRUBINUR Negative 01/19/2015 1112   KETONESUR 5 (A) 06/19/2017 1307   PROTEINUR 30 (A) 06/19/2017 1307   NITRITE NEGATIVE 06/19/2017 1307   LEUKOCYTESUR NEGATIVE 06/19/2017 1307   LEUKOCYTESUR Negative 01/19/2015 1112     RADIOLOGY: No results found.  EKG: Orders placed or performed during the hospital encounter of  07/29/17  . EKG 12-Lead  . EKG 12-Lead  . ED EKG  . ED EKG    IMPRESSION AND PLAN: 1 acute on chronic abdominal pain Pain out of proportion to physical examination, noted recent history of SMA embolism s/p thrombectomy/stent placement by IR 4-5 weeks ago, poor appetite, with complaint of constipation Admit to regular nursing floor bed, check urinalysis, lactic acid level, sedimentation rate, urine drug screen, check acute abdominal series, contrast CT scan unable to be done given acute kidney injury-avoid nephrotoxic agents, IV fluids for rehydration, repeat BMP in morning, and consider possible contrast study in the morning if renal function improves for further 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  2 subacute SMA embolism Diagnosis in August 2018-status post treatment above Continue Eliquis  3 acute kidney injury Baseline renal function is normal Most likely secondary to combination of poor appetite and ACE inhibitor use - clinically appears euvolemic ACE inhibitor, avoid nephrotoxic agents, IV fluids for rehydration, strict monitoring, and repeat BMP in the morning  4 acute on chronic constipation Continue MiraLAX when necessary, lactulose by mouth 1 now, check acute abdominal series for further evaluation  5 chronic GERD without esophagitis PPI daily  6 chronic tobacco smoking abuse/dependency Nicotine patch daily with cessation counseling ordered  Full code Condition stable Prognosis fair DVT prophylaxis-on Eliquis Disposition home in 1-2 days     All the records are reviewed and case discussed with ED provider. Management plans discussed with the patient, family and they are in agreement.  CODE STATUS: Code Status History    Date Active  Date Inactive Code Status Order ID Comments User Context   06/19/2017  8:19 PM 06/28/2017  4:38 PM Full Code [id number]  Vickie Epley, MD ED       TOTAL TIME TAKING CARE OF THIS PATIENT: 35 minutes.    Avel Peace  Salary M.D on 07/29/2017   Between 7am to 6pm - Pager - [phone]  After 6pm go to [email or web address] - password [email or web address] [facility]  Office  [phone]  CC: Primary care physician; Patient, No Pcp Per   Note: This dictation was prepared with Dragon dictation along with smaller phrase technology. Any transcriptional errors that result from this process are unintentional.

## 2017-07-29 NOTE — ED Provider Notes (Signed)
[facility] Emergency Department Provider Note ____________________________________________   I have reviewed the triage vital signs and the triage nursing note.  HISTORY  Chief Complaint Abdominal Pain   Historian Patient  HPI Carla [name] is a 56 y.o. female Presents with complaint of abdominal cramping and points to her mid abdomen but then points all over. She states that she had this pain when she was in the hospital in August and was diagnosed with SMA occlusion. She states that she never really felt any better although she was discharged with oxycodone and states that once the pain medicine ran out she persisted to have abdominal pain.  Does not report any fever, nausea, vomiting, black or bloody stools.  Symptoms are moderate and nothing makes it worse or better.    Past Medical History:  Diagnosis Date  . Abdominal aortic atherosclerosis ([place])    a. 05/2017 CTA abd/pelvis: significant atherosclerotic dzs of the infrarenal abd Ao w/ some mural thrombus. No aneurysm or dissection.  . Baker's cyst of knee, right    a. 07/2016 U/S: 4.1 x 1.4 x 2.9 cystic structure in R poplitetal fossa.  . Bell's palsy   . Chest pain    a. 08/2012 Lexiscan MV: EF 54%, non ischemia/infarct.  . Diastolic dysfunction    a. 05/2017 Echo: Ef 60-65%, no rwma, Gr1 DD, no source of cardiac emboli.  . 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 05/2017 CTA Abd/pelvis: apparent thrombus or embolus in prox SMA (70-90%); b. 05/2017 catheter directed tPA, mechanical thrombectomy, and stenting of the SMA.  [name] GERD (gastroesophageal reflux disease)   . Hyperlipidemia   . Hypertension   . Morbid obesity with BMI of 40.0-44.9, adult [facility])     Patient Active Problem List   Diagnosis Date Noted  . 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 Generalized abdominal pain   . Occlusive mesenteric ischemia ([place]) 06/19/2017  . 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  Acute focal ischemia of small intestine ([facility])   . Acute right-sided low back pain with right-sided sciatica 06/13/2017  . Primary osteoarthritis of both knees 06/13/2017  . Pain syndrome, chronic 09/13/2016  . Morbid obesity with BMI of 40.0-44.9, adult ([place]) 07/16/2016  . Personal history of other malignant neoplasm of skin 06/01/2016  . Chronic pain of both knees 01/19/2016  . Essential hypertension with goal blood pressure less than 140/90 01/19/2016  . Gastroesophageal reflux disease 01/19/2016  . Hyperlipidemia 01/19/2016  . Prediabetes 01/19/2016  . Recurrent major depressive disorder ([place]) 01/19/2016  . Urinary tract infection 01/09/2014  . Chronic pelvic pain in female 11/14/2013  . Mixed stress and urge urinary incontinence 11/14/2013  . Chest pain 08/06/2012    Past Surgical History:  Procedure Laterality Date  . CHOLECYSTECTOMY    . TEE WITHOUT CARDIOVERSION N/A 06/22/2017   Procedure: TRANSESOPHAGEAL ECHOCARDIOGRAM (TEE);  Surgeon: Wellington Hampshire, MD;  Location: [facility] ORS;  Service: Cardiovascular;  Laterality: N/A;  . VAGINAL HYSTERECTOMY    . VISCERAL ARTERY INTERVENTION N/A 06/20/2017   Procedure: Visceral Artery Intervention, possible aortic thrombectomy;  Surgeon: Algernon Huxley, MD;  Location: [place] CV LAB;  Service: Cardiovascular;  Laterality: N/A;    Prior to Admission medications   Medication Sig Start Date End Date Taking? Authorizing Provider  acetaminophen (TYLENOL) 500 MG tablet Take 1 tablet by mouth every 6 (six) hours as needed. 06/13/17   [provider]  ALPRAZolam Duanne Moron) 0.5 MG tablet  Take 1 tablet (0.5 mg total) by mouth 2 (two) times daily as needed for anxiety. 06/28/17   Olean Ree, MD  apixaban (ELIQUIS) 5 MG TABS tablet Take 1 tablet (5 mg total) by mouth 2 (two) times daily. 06/28/17   Olean Ree, MD  aspirin EC 81 MG tablet Take 81 mg by mouth daily.    [provider]  atorvastatin (LIPITOR) 80 MG tablet Take 80 mg  by mouth at bedtime.    [provider]  clobetasol ointment (TEMOVATE) 9.38 % Apply 2 application topically 2 (two) times daily. 06/13/17   [provider]  diclofenac (VOLTAREN) 50 MG EC tablet Take 1 tablet by mouth daily. 10/25/16   [provider]  escitalopram (LEXAPRO) 10 MG tablet Take 10 mg by mouth daily.    [provider]  esomeprazole (NEXIUM) 40 MG capsule Take 40 mg by mouth daily before breakfast.    [provider]  gabapentin (NEURONTIN) 300 MG capsule Take 300 mg by mouth 2 (two) times daily.    [provider]  hydrOXYzine (ATARAX/VISTARIL) 50 MG tablet Take 1 tablet by mouth 3 (three) times daily as needed. 01/22/17   [provider]  ibuprofen (ADVIL,MOTRIN) 400 MG tablet Take 1 tablet by mouth every 8 (eight) hours as needed. 06/13/17   [provider]  lisinopril (PRINIVIL,ZESTRIL) 40 MG tablet Take 40 mg by mouth daily.    [provider]  metoprolol succinate (TOPROL-XL) 100 MG 24 hr tablet Take 100 mg by mouth daily.    [provider]  metoprolol tartrate (LOPRESSOR) 25 MG tablet Take 25 mg by mouth 2 (two) times daily.    [provider]  omeprazole (PRILOSEC) 20 MG capsule Take 20 mg by mouth 2 (two) times daily.    [provider]  oxybutynin (DITROPAN) 5 MG tablet Take 1 tablet by mouth 3 (three) times daily as needed. 06/13/17   [provider]  oxyCODONE (OXY IR/ROXICODONE) 5 MG immediate release tablet Take 1 tablet (5 mg total) by mouth every 4 (four) hours as needed for moderate pain or severe pain. 06/28/17   Olean Ree, MD  PARoxetine (PAXIL) 20 MG tablet Take 20 mg by mouth every morning.    [provider]  polyethylene glycol (MIRALAX / GLYCOLAX) packet Take 17 g by mouth 2 (two) times daily. 06/28/17   Olean Ree, MD  rosuvastatin (CRESTOR) 10 MG tablet Take 1 tablet by mouth daily. 04/05/14   [provider]  traMADol (ULTRAM) 50  MG tablet Take 50 mg by mouth every 6 (six) hours as needed for moderate pain.     [provider]  Vitamin D, Ergocalciferol, (DRISDOL) 50000 units CAPS capsule Take 1 capsule by mouth once a week. 06/13/17   [provider]    No Known Allergies  Family History  Problem Relation Age of Onset  . Hypertension Mother   . Heart disease Mother   . Heart attack Mother   . Breast cancer Mother   . Hypertension Father   . Breast cancer Paternal Aunt     Social History Social History  Substance Use Topics  . Smoking status: Current Some Day Smoker    Packs/day: 0.50    Types: Cigarettes  . Smokeless tobacco: Never Used  . Alcohol use No    Review of Systems  Constitutional: Negative for fever. Eyes: Negative for visual changes. ENT: Negative for sore throat. Cardiovascular: Negative for chest pain. Respiratory: Negative for shortness of  breath. Gastrointestinal: Negative for vomiting and diarrhea. Genitourinary: Negative for dysuria. Musculoskeletal: Negative for back pain. Skin: Negative for rash. Neurological: Negative for headache.  ____________________________________________   PHYSICAL EXAM:  VITAL SIGNS: ED Triage Vitals  Enc Vitals Group     BP 07/29/17 1135 98/61     Pulse Rate 07/29/17 1135 66     Resp 07/29/17 1135 (!) 8     Temp 07/29/17 1135 98.5 F (36.9 C)     Temp Source 07/29/17 1135 Oral     SpO2 07/29/17 1135 96 %     Weight 07/29/17 1132 231 lb (104.8 kg)     Height 07/29/17 1132 5\' 4"  (1.626 m)     Head Circumference --      Peak Flow --      Pain Score 07/29/17 1132 0     Pain Loc --      Pain Edu? --      Excl. in [place]? --      Constitutional: Alert and oriented. Well appearing and in no distress. HEENT   Head: Normocephalic and atraumatic.      Eyes: Conjunctivae are normal. Pupils equal and round.       Ears:         Nose: No congestion/rhinnorhea.   Mouth/Throat: Mucous membranes are moist.   Neck: No  stridor. Cardiovascular/Chest: Normal rate, regular rhythm.  No murmurs, rubs, or gallops. Respiratory: Normal respiratory effort without tachypnea nor retractions. Breath sounds are clear and equal bilaterally. No wheezes/rales/rhonchi. Gastrointestinal: Soft. No distention, no guarding, no rebound.  Obese, mild tendnerness diffusely.  Genitourinary/rectal:Deferred Musculoskeletal: Nontender with normal range of motion in all extremities. No joint effusions.  No lower extremity tenderness.  No edema. Neurologic:  Normal speech and language. No gross or focal neurologic deficits are appreciated. Skin:  Skin is warm, dry and intact. No rash noted. Psychiatric: Mood and affect are normal. Speech and behavior are normal. Patient exhibits appropriate insight and judgment.   ____________________________________________  LABS (pertinent positives/negatives) I, Lisa Roca, MD the attending physician have reviewed the labs noted below.  Labs Reviewed  COMPREHENSIVE METABOLIC PANEL - Abnormal; Notable for the following:       Result Value   Glucose, Bld 116 (*)    BUN 21 (*)    Creatinine, Ser 1.73 (*)    Calcium 8.7 (*)    Albumin 3.3 (*)    ALT 13 (*)    GFR calc non Af Amer 32 (*)    GFR calc Af Amer 37 (*)    All other components within normal limits  CBC - Abnormal; Notable for the following:    RBC 3.72 (*)    Hemoglobin 11.4 (*)    HCT 33.0 (*)    RDW 15.4 (*)    All other components within normal limits  LIPASE, BLOOD  URINALYSIS, COMPLETE (UACMP) WITH MICROSCOPIC    ____________________________________________    EKG I, Lisa Roca, MD, the attending physician have personally viewed and interpreted all ECGs.  None ____________________________________________  RADIOLOGY All Xrays were viewed by me.  Imaging interpreted by Radiologist, and I, Lisa Roca, MD the attending physician have reviewed the radiologist interpretation noted  below.  None __________________________________________  PROCEDURES  Procedure(s) performed: None  Critical Care performed: None  ____________________________________________  No current facility-administered medications on file prior to encounter.    Current Outpatient Prescriptions on File Prior to Encounter  Medication Sig Dispense Refill  . acetaminophen (TYLENOL) 500 MG tablet Take 1 tablet  by mouth every 6 (six) hours as needed.    . ALPRAZolam (XANAX) 0.5 MG tablet Take 1 tablet (0.5 mg total) by mouth 2 (two) times daily as needed for anxiety. 10 tablet 0  . apixaban (ELIQUIS) 5 MG TABS tablet Take 1 tablet (5 mg total) by mouth 2 (two) times daily. 60 tablet 5  . aspirin EC 81 MG tablet Take 81 mg by mouth daily.    [name] atorvastatin (LIPITOR) 80 MG tablet Take 80 mg by mouth at bedtime.    . clobetasol ointment (TEMOVATE) 6.07 % Apply 2 application topically 2 (two) times daily.    . diclofenac (VOLTAREN) 50 MG EC tablet Take 1 tablet by mouth daily.    [name] escitalopram (LEXAPRO) 10 MG tablet Take 10 mg by mouth daily.    [name] esomeprazole (NEXIUM) 40 MG capsule Take 40 mg by mouth daily before breakfast.    . gabapentin (NEURONTIN) 300 MG capsule Take 300 mg by mouth 2 (two) times daily.    . hydrOXYzine (ATARAX/VISTARIL) 50 MG tablet Take 1 tablet by mouth 3 (three) times daily as needed.    [name] ibuprofen (ADVIL,MOTRIN) 400 MG tablet Take 1 tablet by mouth every 8 (eight) hours as needed.    [name] lisinopril (PRINIVIL,ZESTRIL) 40 MG tablet Take 40 mg by mouth daily.    . metoprolol succinate (TOPROL-XL) 100 MG 24 hr tablet Take 100 mg by mouth daily.    . metoprolol tartrate (LOPRESSOR) 25 MG tablet Take 25 mg by mouth 2 (two) times daily.    [name] omeprazole (PRILOSEC) 20 MG capsule Take 20 mg by mouth 2 (two) times daily.    [name] oxybutynin (DITROPAN) 5 MG tablet Take 1 tablet by mouth 3 (three) times daily as needed.    [name] oxyCODONE (OXY IR/ROXICODONE) 5 MG immediate release tablet Take 1  tablet (5 mg total) by mouth every 4 (four) hours as needed for moderate pain or severe pain. 15 tablet 0  . PARoxetine (PAXIL) 20 MG tablet Take 20 mg by mouth every morning.    . polyethylene glycol (MIRALAX / GLYCOLAX) packet Take 17 g by mouth 2 (two) times daily. 14 each 0  . rosuvastatin (CRESTOR) 10 MG tablet Take 1 tablet by mouth daily.    . traMADol (ULTRAM) 50 MG tablet Take 50 mg by mouth every 6 (six) hours as needed for moderate pain.     . Vitamin D, Ergocalciferol, (DRISDOL) 50000 units CAPS capsule Take 1 capsule by mouth once a week.      ____________________________________________  ED COURSE / ASSESSMENT AND PLAN  Pertinent labs & imaging results that were available during my care of the patient were reviewed by me and considered in my medical decision making (see chart for details).    Ms. Tillis is here presenting with a complaint of abdominal pain, but it sounds like this is actually chronic abdominal pain that occurred before and during the hospitalization and persisted after treatment for the SMA occlusion in August. It sounds like the pain is less well controlled because she is no longer on chronic pain medication at this point. Next the next line on exam she is found to be hypotensive. She did take her metoprolol, however her laboratory studies also indicate acute renal failure, so some of her hypotension may be related to hypovolemic status.  She is reporting vomiting or diarrhea.  Clinically on exam her abdomen is soft and just diffusely tender. I will try a little bit of fentanyl for  symptomatic relief, and certainly start working on hydration to help with her hypotension as well as acute renal failure.  Kidney function cannot tolerate CT scan with contrast at this point, and again my concern for acute intra-abdominal emergency is relatively low given that the pain she describes is the same before during and after hospitalization.  Awaiting ua.  Patient does not  report having a fever, I don't think that she has sepsis, but given the hypotension I will go ahead and add a lactate as well as a blood and urine culture.    DIFFERENTIAL DIAGNOSIS: Differential diagnosis includes, but is not limited to, biliary disease (biliary colic, acute cholecystitis, cholangitis, choledocholithiasis, etc), intrathoracic causes for epigastric abdominal pain including ACS, gastritis, duodenitis, pancreatitis, small bowel or large bowel obstruction, abdominal aortic aneurysm, hernia, and gastritis.  CONSULTATIONS:   Hospitalist for admission.   Patient / Family / Caregiver informed of clinical course, medical decision-making process, and agree with plan.   ___________________________________________   FINAL CLINICAL IMPRESSION(S) / ED DIAGNOSES   Final diagnoses:  Acute renal failure, unspecified acute renal failure type ([place])  Hypotension, unspecified hypotension type              Note: This dictation was prepared with Dragon dictation. Any transcriptional errors that result from this process are unintentional    Lisa Roca, MD 07/29/17 1329

## 2017-07-30 ENCOUNTER — Inpatient Hospital Stay: Payer: Medicaid Other

## 2017-07-30 ENCOUNTER — Encounter: Payer: Self-pay | Admitting: Radiology

## 2017-07-30 DIAGNOSIS — I10 Essential (primary) hypertension: Secondary | ICD-10-CM

## 2017-07-30 DIAGNOSIS — K55011 Focal (segmental) acute (reversible) ischemia of small intestine: Secondary | ICD-10-CM

## 2017-07-30 DIAGNOSIS — R109 Unspecified abdominal pain: Secondary | ICD-10-CM

## 2017-07-30 LAB — BASIC METABOLIC PANEL
Anion gap: 4 — ABNORMAL LOW (ref 5–15)
BUN: 15 mg/dL (ref 6–20)
CO2: 25 mmol/L (ref 22–32)
Calcium: 8 mg/dL — ABNORMAL LOW (ref 8.9–10.3)
Chloride: 113 mmol/L — ABNORMAL HIGH (ref 101–111)
Creatinine, Ser: 1.11 mg/dL — ABNORMAL HIGH (ref 0.44–1.00)
GFR calc Af Amer: >60 mL/min (ref >60)
GFR calc non Af Amer: 54 mL/min — ABNORMAL LOW (ref >60)
Glucose, Bld: 111 mg/dL — ABNORMAL HIGH (ref 65–99)
Potassium: 3.8 mmol/L (ref 3.5–5.1)
Sodium: 142 mmol/L (ref 135–145)

## 2017-07-30 MED ORDER — DEXTROSE-NACL 5-0.9 % IV SOLN
INTRAVENOUS | Status: AC
  Administered 2017-07-30 – 2017-07-31 (×2): via INTRAVENOUS

## 2017-07-30 MED ORDER — IOPAMIDOL (ISOVUE-300) INJECTION 61%
15.0000 mL | INTRAVENOUS | Status: AC
  Administered 2017-07-30 (×2): 15 mL via INTRAVENOUS

## 2017-07-30 MED ORDER — IOPAMIDOL (ISOVUE-300) INJECTION 61%
100.0000 mL | Freq: Once | INTRAVENOUS | Status: AC | PRN
  Administered 2017-07-30: 100 mL via INTRAVENOUS

## 2017-07-30 NOTE — Progress Notes (Signed)
Dr. Molli Hazard returned paged, no new orders at this time, just monitor pt, he will be in to see her this morning. B/P now 91/51. She is not to receive any BP meds this A.M.

## 2017-07-30 NOTE — Progress Notes (Signed)
Pt. BP low (77/33), pt. Asymptomatic, alert and oriented with no complaints, prime paged awaiting call back.

## 2017-07-30 NOTE — Progress Notes (Signed)
Dr. Marcille Blanco returned page, he wants me to call Dr. Jerelyn Charles, paged, Dr. Jerelyn Charles awaiting call back.

## 2017-07-30 NOTE — Progress Notes (Signed)
[place] [facility] [place] NAME: Carla [name]    MR#:  [id number]  DATE OF BIRTH:  [birth year]  SUBJECTIVE:  CHIEF COMPLAINT:  Patient is reporting lower abdominal pain 8 out of 10. Reports nausea no vomiting.  REVIEW OF SYSTEMS:  CONSTITUTIONAL: No fever, fatigue or weakness.  EYES: No blurred or double vision.  EARS, NOSE, AND THROAT: No tinnitus or ear pain.  RESPIRATORY: No cough, shortness of breath, wheezing or hemoptysis.  CARDIOVASCULAR: No chest pain, orthopnea, edema.  GASTROINTESTINAL: Reporting lower abdominal pain with nausea but denies any vomiting or diarrhea  GENITOURINARY: No dysuria, hematuria.  ENDOCRINE: No polyuria, nocturia,  HEMATOLOGY: No anemia, easy bruising or bleeding SKIN: No rash or lesion. MUSCULOSKELETAL: No joint pain or arthritis.   NEUROLOGIC: No tingling, numbness, weakness.  PSYCHIATRY: No anxiety or depression.   DRUG ALLERGIES:  No Known Allergies  VITALS:  Blood pressure (!) 90/49, pulse (!) 57, temperature 97.6 F (36.4 C), temperature source Oral, resp. rate 18, height 5\' 4"  (1.626 m), weight 104.8 kg (231 lb), SpO2 96 %.  PHYSICAL EXAMINATION:  GENERAL:  56 y.o.-year-old patient lying in the bed with no acute distress.  EYES: Pupils equal, round, reactive to light and accommodation. No scleral icterus. Extraocular muscles intact.  HEENT: Head atraumatic, normocephalic. Oropharynx and nasopharynx clear.  NECK:  Supple, no jugular venous distention. No thyroid enlargement, no tenderness.  LUNGS: Normal breath sounds bilaterally, no wheezing, rales,rhonchi or crepitation. No use of accessory muscles of respiration.  CARDIOVASCULAR: S1, S2 normal. No murmurs, rubs, or gallops.  ABDOMEN: Soft, Lower abdomen tender, no rebound tenderness nondistended. Bowel sounds present.  EXTREMITIES: No pedal edema, cyanosis, or clubbing.  NEUROLOGIC: Cranial nerves II through XII are intact. Muscle strength 5/5  in all extremities. Sensation intact. Gait not checked.  PSYCHIATRIC: The patient is alert and oriented x 3.  SKIN: No obvious rash, lesion, or ulcer.    LABORATORY PANEL:   CBC  Recent Labs Lab 07/29/17 1131  WBC 4.9  HGB 11.4*  HCT 33.0*  PLT 255   ------------------------------------------------------------------------------------------------------------------  Chemistries   Recent Labs Lab 07/29/17 1131 07/30/17 0341  NA 137 142  K 4.2 3.8  CL 105 113*  CO2 23 25  GLUCOSE 116* 111*  BUN 21* 15  CREATININE 1.73* 1.11*  CALCIUM 8.7* 8.0*  AST 16  --   ALT 13*  --   ALKPHOS 88  --   BILITOT 0.4  --    ------------------------------------------------------------------------------------------------------------------  Cardiac Enzymes No results for input(s): TROPONINI in the last 168 hours. ------------------------------------------------------------------------------------------------------------------  RADIOLOGY:  Acute Abdominal Series  Result Date: 07/29/2017 CLINICAL DATA:  Abdominal pain. EXAM: DG ABDOMEN ACUTE W/ 1V CHEST COMPARISON:  CT abdomen pelvis and chest x-ray dated June 19, 2017. FINDINGS: There are a few loops of borderline dilated small bowel in the left upper quadrant. No air-fluid levels. Air and stool are noted throughout the colon. No pneumoperitoneum. Prior cholecystectomy. Proximal SMA stent. Surgical clips in the right pelvis. No acute osseous abnormality. Heart size and mediastinal contours are within normal limits. Both lungs are clear. IMPRESSION: 1. Few loops of borderline dilated small bowel in the left upper quadrant, nonspecific, and could reflect enteritis. No definite evidence of obstruction. 2.  No active cardiopulmonary disease. Electronically Signed   By: Titus Dubin M.D.   On: 07/29/2017 16:54    EKG:   Orders placed or performed during the hospital encounter of 07/29/17  . EKG  12-Lead  . EKG 12-Lead  . ED EKG  . ED EKG   . EKG 12-Lead  . EKG 12-Lead    ASSESSMENT AND PLAN:    1 acute on chronic abdominal pain  noted recent history of SMA embolism s/p thrombectomy/stent placement by IR 4-5 weeks ago, poor appetite, with complaint of constipation Urinalysis with trace leukoesterase but negative nitrites. Urine culture and sensitivity  Normal lactic acid level sedimentation rate above  normal range at 49 urine drug screen-negative   2 subacute SMA embolism Diagnosis in August 2018-status post treatment above Continue Eliquis  3 acute kidney injury prerenal from poor by mouth intake and ACE inhibitor use-  renal function improved with creatinine 1.11 today ,Baseline renal function is normal  clinically hypovolemic hold ACE inhibitor ACE inhibitor, avoid nephrotoxic agents, IV fluids for rehydration  repeat BMP in the morning  4 acute on chronic constipation Continue MiraLAX when necessary, lactulose by mouth 1 now, check acute abdominal series for further evaluation  5 chronic GERD without esophagitis PPI daily  6 chronic tobacco smoking abuse/dependency Nicotine patch daily with cessation counseling ordered  Full code  DVT prophylaxis-on Eliquis  All the records are reviewed and case discussed with Care Management/Social Workerr. Management plans discussed with the patient, family and they are in agreement.  CODE STATUS: FC   TOTAL TIME TAKING CARE OF THIS PATIENT: 35 minutes.   POSSIBLE D/C IN 2-3  DAYS, DEPENDING ON CLINICAL CONDITION.  Note: This dictation was prepared with Dragon dictation along with smaller phrase technology. Any transcriptional errors that result from this process are unintentional.   Nicholes Mango M.D on 07/30/2017 at 10:05 AM  Between 7am to 6pm - Pager - [phone] After 6pm go to [email or web address] - password [email or web address] [place] [facility]  Office  [phone]  CC: Primary care physician; Patient, No Pcp Per

## 2017-07-30 NOTE — Progress Notes (Signed)
Chaplain rounding unit visited with pt. Pt was talking with sister on phone when [facility] arrived. Pt talked about her illness and she also mentioned her mom who passed away on 07-27-2023. Pt was emotion and her eyes misty when she mentioned the death of her mother. [place] provided pastoral support and presence. Pt to be transported to CT Scan this PM.     07/30/17 1500  Clinical Encounter Type  Visited With Patient  Visit Type Initial  Referral From Chaplain  Consult/Referral To [place];Other (Comment)

## 2017-07-30 NOTE — Progress Notes (Signed)
Pt family brought in food and a milk shake for patient.  Pt actively drinking and eating.  Pt on Clear Liquid Diet.  Advised pt and educated family that she is not allowed to take in anything but Clear Liquids.  Pt stated understanding.  Removed food and drink from pt.  Will continue to monitor.

## 2017-07-30 NOTE — Consult Note (Signed)
[facility] Vascular Consult Note  MRN : [id number]  Carla [name] is a 56 y.o. (Sep 20, 1961) female who presents with chief complaint of  Chief Complaint  Patient presents with  . Abdominal Pain  .  History of Present Illness: I am asked to see the patient by Dr. Margaretmary Eddy for abdominal pain. 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 She has had intermittent mid abdominal pain since that time. One of her biggest complaints today is constipation. She sounds like she is abusing stool softener/cathartics to try to have bowel movements without results. She says her appetite is poor and her energy level was low. She seems to be eating okay today and has not lost a significant amount of weight. She reports she has been taking her blood thinners. She denies fever or chills. On admission her lactate was normal and her white blood cell count was normal. She underwent a CT scan which I have independently reviewed. Her SMA stent appears to be widely patent with excellent perfusion distally. The distal aortic thrombus that was seen on her last study is improved although not entirely resolved. It has more the appearance of mural thrombus at the aortic bifurcation at this point. It does not appear flow limiting. She has not had any signs of bleeding or problems taking her blood thinners.  Current Facility-Administered Medications  Medication Dose Route Frequency Provider Last Rate Last Dose  . acetaminophen (TYLENOL) tablet 650 mg  650 mg Oral Q6H PRN Salary, Montell D, MD       Or  . acetaminophen (TYLENOL) suppository 650 mg  650 mg Rectal Q6H PRN Salary, Montell D, MD      . ALPRAZolam Duanne Moron) tablet 0.5 mg  0.5 mg Oral BID PRN Salary, Montell D, MD      . apixaban (ELIQUIS) tablet 5 mg  5 mg Oral BID Salary, Montell D, MD   5 mg at 07/30/17 1038  . atorvastatin (LIPITOR) tablet 80 mg  80 mg Oral  QHS Loney Hering D, MD   80 mg at 07/29/17 2156  . gabapentin (NEURONTIN) capsule 300 mg  300 mg Oral BID Salary, Montell D, MD   300 mg at 07/30/17 1039  . nicotine (NICODERM CQ - dosed in mg/24 hours) patch 14 mg  14 mg Transdermal Daily Salary, Montell D, MD   14 mg at 07/29/17 1733  . ondansetron (ZOFRAN) tablet 4 mg  4 mg Oral Q6H PRN Salary, Montell D, MD   4 mg at 07/30/17 1039   Or  . ondansetron (ZOFRAN) injection 4 mg  4 mg Intravenous Q6H PRN Salary, Montell D, MD      . oxybutynin (DITROPAN) tablet 5 mg  5 mg Oral TID Loney Hering D, MD   5 mg at 07/30/17 1617  . oxyCODONE (Oxy IR/ROXICODONE) immediate release tablet 5 mg  5 mg Oral Q6H PRN Salary, Montell D, MD   5 mg at 07/30/17 1640  . pantoprazole (PROTONIX) EC tablet 40 mg  40 mg Oral QAC breakfast Salary, Montell D, MD   40 mg at 07/30/17 1039  . polyethylene glycol (MIRALAX / GLYCOLAX) packet 17 g  17 g Oral BID Loney Hering D, MD   17 g at 07/29/17 2156    Past Medical History:  Diagnosis Date  . Abdominal aortic atherosclerosis ([place])    a. 05/2017 CTA abd/pelvis: significant atherosclerotic dzs  of the infrarenal abd Ao w/ some mural thrombus. No aneurysm or dissection.  . Baker's cyst of knee, right    a. 07/2016 U/S: 4.1 x 1.4 x 2.9 cystic structure in R poplitetal fossa.  . Bell's palsy   . Cancer ([place])   . Chest pain    a. 08/2012 Lexiscan MV: EF 54%, non ischemia/infarct.  . Diastolic dysfunction    a. 05/2017 Echo: Ef 60-65%, no rwma, Gr1 DD, no source of cardiac emboli.  . 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 05/2017 CTA Abd/pelvis: apparent thrombus or embolus in prox SMA (70-90%); b. 05/2017 catheter directed tPA, mechanical thrombectomy, and stenting of the SMA.  [name] GERD (gastroesophageal reflux disease)   . Hyperlipidemia   . Hypertension   . Morbid obesity with BMI of 40.0-44.9, adult [facility])     Past Surgical History:  Procedure Laterality Date  . CHOLECYSTECTOMY    . TEE WITHOUT  CARDIOVERSION N/A 06/22/2017   Procedure: TRANSESOPHAGEAL ECHOCARDIOGRAM (TEE);  Surgeon: Wellington Hampshire, MD;  Location: [facility] ORS;  Service: Cardiovascular;  Laterality: N/A;  . VAGINAL HYSTERECTOMY    . VISCERAL ARTERY INTERVENTION N/A 06/20/2017   Procedure: Visceral Artery Intervention, possible aortic thrombectomy;  Surgeon: Algernon Huxley, MD;  Location: [place] [facility];  Service: Cardiovascular;  Laterality: N/A;    Social History Social History  Substance Use Topics  . Smoking status: Current Some Day Smoker    Packs/day: 0.50    Types: Cigarettes  . Smokeless tobacco: Never Used  . Alcohol use No  No IVDU  Family History Family History  Problem Relation Age of Onset  . Hypertension Mother   . Heart disease Mother   . Heart attack Mother   . Breast cancer Mother   . Hypertension Father   . Breast cancer Paternal Aunt     No Known Allergies   REVIEW OF SYSTEMS (Negative unless checked)  Constitutional: [] Weight loss  [] Fever  [] Chills Cardiac: [] Chest pain   [] Chest pressure   [] Palpitations   [] Shortness of breath when laying flat   [] Shortness of breath at rest   [x] Shortness of breath with exertion. Vascular:  [] Pain in legs with walking   [] Pain in legs at rest   [] Pain in legs when laying flat   [] Claudication   [] Pain in feet when walking  [] Pain in feet at rest  [] Pain in feet when laying flat   [] History of DVT   [] Phlebitis   [] Swelling in legs   [] Varicose veins   [] Non-healing ulcers Pulmonary:   [] Uses home oxygen   [] Productive cough   [] Hemoptysis   [] Wheeze  [] COPD   [] Asthma Neurologic:  [] Dizziness  [] Blackouts   [] Seizures   [] History of stroke   [] History of TIA  [] Aphasia   [] Temporary blindness   [] Dysphagia   [] Weakness or numbness in arms   [] Weakness or numbness in legs Musculoskeletal:  [] Arthritis   [] Joint swelling   [] Joint pain   [] Low back pain Hematologic:  [] Easy bruising  [] Easy bleeding   [] Hypercoagulable state   [] Anemic   [] Hepatitis Gastrointestinal:  [] Blood in stool   [] Vomiting blood  [x] Gastroesophageal reflux/heartburn   [] Difficulty swallowing. Positive for abdominal pain and constipation with poor appetite. Genitourinary:  [] Chronic kidney disease   [] Difficult urination  [] Frequent urination  [] Burning with urination   [] Blood in urine Skin:  [] Rashes   [] Ulcers   [] Wounds Psychological:  [] History of anxiety   []  History of major depression.  Physical Examination  Vitals:   07/30/17 0617 07/30/17 0626 07/30/17 0628 07/30/17 0701  BP: (!) 77/33 (!) 80/47 (!) 84/40 (!) 90/49  Pulse: (!) 55 (!) 57 (!) 57 (!) 57  Resp: 18     Temp: 97.6 F (36.4 C)     TempSrc: Oral     SpO2: 96%     Weight:      Height:       Body mass index is 39.65 kg/m. Gen:  WD/WN, appears older than stated age. Head: [place]/AT, No temporalis wasting.  Ear/Nose/Throat: Hearing grossly intact, nares w/o erythema or drainage, oropharynx w/o Erythema/Exudate Eyes: Sclera non-icteric, conjunctiva clear Neck: Trachea midline.  No JVD.  Pulmonary:  Good air movement, respirations not labored, equal bilaterally.  Cardiac: RRR, normal S1, S2. Vascular:  Vessel Right Left  Radial Palpable Palpable                          PT Palpable Palpable  DP Palpable Palpable   Gastrointestinal: soft, non-tender/non-distended. No guarding/reflex. Palpation does not worsen her pain today Musculoskeletal: M/S 5/5 throughout.  Extremities without ischemic changes.  No deformity or atrophy.  Neurologic: Sensation grossly intact in extremities.  Symmetrical.  Speech is fluent. Motor exam as listed above. Psychiatric: Judgment intact, Mood & affect appropriate for pt's clinical situation. Dermatologic: No rashes or ulcers noted.  No cellulitis or open wounds.       CBC Lab Results  Component Value Date   WBC 4.9 07/29/2017   HGB 11.4 (L) 07/29/2017   HCT 33.0 (L) 07/29/2017   MCV 88.8 07/29/2017   PLT 255 07/29/2017    BMET     Component Value Date/Time   NA 142 07/30/2017 0341   NA 140 02/12/2015 1137   K 3.8 07/30/2017 0341   K 2.9 (L) 02/12/2015 1137   CL 113 (H) 07/30/2017 0341   CL 107 02/12/2015 1137   CO2 25 07/30/2017 0341   CO2 27 02/12/2015 1137   GLUCOSE 111 (H) 07/30/2017 0341   GLUCOSE 120 (H) 02/12/2015 1137   BUN 15 07/30/2017 0341   BUN 15 02/12/2015 1137   CREATININE 1.11 (H) 07/30/2017 0341   CREATININE 0.78 02/12/2015 1137   CALCIUM 8.0 (L) 07/30/2017 0341   CALCIUM 9.0 02/12/2015 1137   GFRNONAA 54 (L) 07/30/2017 0341   GFRNONAA >60 02/12/2015 1137   GFRAA >60 07/30/2017 0341   GFRAA >60 02/12/2015 1137   Estimated Creatinine Clearance: 65.9 mL/min (A) (by C-G formula based on SCr of 1.11 mg/dL (H)).  COAG Lab Results  Component Value Date   INR 1.53 06/27/2017   INR 1.17 06/26/2017   INR 0.98 06/25/2017    Radiology Ct Abdomen Pelvis W Contrast  Result Date: 07/30/2017 CLINICAL DATA:  Abdominal pain for 6 weeks EXAM: CT ABDOMEN AND PELVIS WITH CONTRAST TECHNIQUE: Multidetector CT imaging of the abdomen and pelvis was performed using the standard protocol following bolus administration of intravenous contrast. CONTRAST:  138mL ISOVUE-300 IOPAMIDOL (ISOVUE-300) INJECTION 61% COMPARISON:  CT 06/19/2017 FINDINGS: Lower chest: Lung bases are clear. Hepatobiliary: No focal hepatic lesion. Postcholecystectomy. No biliary dilatation. Pancreas: Pancreas is normal. No ductal dilatation. No pancreatic inflammation. Spleen: Normal spleen Adrenals/urinary tract: Thickening of LEFT adrenal gland. On delayed imaging gland has Hounsfield units less than 10 consistent benign adenoma. Kidneys enhance symmetrically. No obstruction. Ureters and bladder normal Stomach/Bowel: Stomach and duodenum are normal. Mesenteric inflammation associated loop of small bowel in the upper pelvis.  Inflammation occurs over approximately 5 cm segment and associated with mild bowel wall thickening and edema. (Image 64,  series 6 and image 61, series 2). This is site of prior of focal ischemia of the small bowel. The bowel wall thickening is improved compared to prior. Inflammation similar. No additional small bowel lesions are present. Terminal ileum is normal. Appendix not identified. Appendix not identified. The: And rectosigmoid colon are normal. Vascular/Lymphatic: Stent within the superior mesenteric artery which fills distally to the stent. Celiac trunk patent. IMA patent minimal atherosclerotic calcification. Reproductive: Post hysterectomy Other: No free fluid. Musculoskeletal: No aggressive osseous lesion. IMPRESSION: 1. Persistent but improved segment of mid small bowel inflammation / edema presumably related to partial SMA occlusion seen on CT 06/19/2017. Interval placement of a stent in the SMA with good filling distally. 2. No additional acute findings the abdomen pelvis. 3. No additional sites of thromboembolism or ischemia. Electronically Signed   By: Suzy Bouchard M.D.   On: 07/30/2017 16:09   Acute Abdominal Series  Result Date: 07/29/2017 CLINICAL DATA:  Abdominal pain. EXAM: DG ABDOMEN ACUTE W/ 1V CHEST COMPARISON:  CT abdomen pelvis and chest x-ray dated June 19, 2017. FINDINGS: There are a few loops of borderline dilated small bowel in the left upper quadrant. No air-fluid levels. Air and stool are noted throughout the colon. No pneumoperitoneum. Prior cholecystectomy. Proximal SMA stent. Surgical clips in the right pelvis. No acute osseous abnormality. Heart size and mediastinal contours are within normal limits. Both lungs are clear. IMPRESSION: 1. Few loops of borderline dilated small bowel in the left upper quadrant, nonspecific, and could reflect enteritis. No definite evidence of obstruction. 2.  No active cardiopulmonary disease. Electronically Signed   By: Titus Dubin M.D.   On: 07/29/2017 16:54      Assessment/Plan 1. Abdominal pain.  Etiology not entirely clear but would be  suspicious of stricture or bowel issue from previous event 6 weeks ago. SMA stent is widely patent and there is nothing further to do from a vascular standpoint.  Would recommend General Surgery consultation for further evaluation. Would recommend continuing Eliquis as tolerated but can hold if any general surgery is required. 2. Constipation. Similar etiology to number 1?.  3. HTN. Stable on outpatient medications and blood pressure control important in reducing the progression of atherosclerotic disease. On appropriate oral medications. 4. Aortic thrombus seen previously looks better than on previous study with minimal distal aortic thrombus on her CT. 5. Hyperlipidemia. Statin therapy   [name], MD  07/30/2017 5:42 PM    This note was created with Dragon medical transcription system.  Any error is purely unintentional

## 2017-07-31 DIAGNOSIS — K5904 Chronic idiopathic constipation: Secondary | ICD-10-CM

## 2017-07-31 MED ORDER — DEXTROSE 5 % IV SOLN
1.0000 g | Freq: Every day | INTRAVENOUS | Status: DC
  Administered 2017-07-31 – 2017-08-02 (×3): 1 g via INTRAVENOUS
  Filled 2017-07-31 (×3): qty 10

## 2017-07-31 MED ORDER — BISACODYL 10 MG RE SUPP
10.0000 mg | Freq: Every day | RECTAL | Status: DC | PRN
  Administered 2017-08-01: 10 mg via RECTAL
  Filled 2017-07-31: qty 1

## 2017-07-31 MED ORDER — POLYETHYLENE GLYCOL 3350 17 G PO PACK
17.0000 g | PACK | Freq: Two times a day (BID) | ORAL | Status: DC
  Administered 2017-07-31 – 2017-08-01 (×3): 17 g via ORAL
  Filled 2017-07-31 (×4): qty 1

## 2017-07-31 NOTE — Progress Notes (Signed)
Okay to let fluid order expire per MD, will reassess labs in the A.M. Pt tolerating full liquid diet.

## 2017-07-31 NOTE — Care Management (Signed)
Patient from home with abdominal pain.  Patient lives at home with her boyfriend.  Patient is uninsured.  Followed by Dr Carlene Coria in [place].  Previous admission patient was provided application too [facility]  [facility] [place].  Patient was set up to obtain her Eliquis from [facility] at that time, as well as provided with a 30 day coupon.  Previous admission atient was set up for 3 home health PT visits by [place], paid for by the [employer]. Patient was provided with a BSC at that time.  RNCM following for discharge planning.

## 2017-07-31 NOTE — Consult Note (Signed)
SURGICAL CONSULTATION NOTE (initial) - cpt: 99254  HISTORY OF PRESENT ILLNESS (HPI):  56 y.o. female presented to [facility] ED 2 days ago for evaluation of abdominal pain. Patient reports crampy intermittent abdominal pain with no BM x 10 days despite her having taken Miralax x 2 and multiple stool softeners all on one day. Patient otherwise reports +flatus and denies any constant or post-prandial abdominal pain, N/V, fever/chills, CP, or SOB. She also reports history of chronic constipation.  Surgery is consulted by medical physician Dr. Margaretmary Eddy in this context for evaluation and management of abdominal pain.  PAST MEDICAL HISTORY (PMH):  Past Medical History:  Diagnosis Date  . Abdominal aortic atherosclerosis ([place])    a. 05/2017 CTA abd/pelvis: significant atherosclerotic dzs of the infrarenal abd Ao w/ some mural thrombus. No aneurysm or dissection.  . Baker's cyst of knee, right    a. 07/2016 U/S: 4.1 x 1.4 x 2.9 cystic structure in R poplitetal fossa.  . Bell's palsy   . Cancer ([place])   . Chest pain    a. 08/2012 Lexiscan MV: EF 54%, non ischemia/infarct.  . Diastolic dysfunction    a. 05/2017 Echo: Ef 60-65%, no rwma, Gr1 DD, no source of cardiac emboli.  . 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 05/2017 CTA Abd/pelvis: apparent thrombus or embolus in prox SMA (70-90%); b. 05/2017 catheter directed tPA, mechanical thrombectomy, and stenting of the SMA.  [name] GERD (gastroesophageal reflux disease)   . Hyperlipidemia   . Hypertension   . Morbid obesity with BMI of 40.0-44.9, adult ([place])      PAST SURGICAL HISTORY ([place]):  Past Surgical History:  Procedure Laterality Date  . CHOLECYSTECTOMY    . TEE WITHOUT CARDIOVERSION N/A 06/22/2017   Procedure: TRANSESOPHAGEAL ECHOCARDIOGRAM (TEE);  Surgeon: Wellington Hampshire, MD;  Location: [facility] ORS;  Service: Cardiovascular;  Laterality: N/A;  . VAGINAL HYSTERECTOMY    . VISCERAL ARTERY INTERVENTION N/A 06/20/2017   Procedure: Visceral Artery  Intervention, possible aortic thrombectomy;  Surgeon: Algernon Huxley, MD;  Location: [facility] [place];  Service: Cardiovascular;  Laterality: N/A;     MEDICATIONS:  Prior to Admission medications   Medication Sig Start Date End Date Taking? Authorizing Provider  apixaban (ELIQUIS) 5 MG TABS tablet Take 1 tablet (5 mg total) by mouth 2 (two) times daily. 06/28/17  Yes Piscoya, Jacqulyn Bath, MD  atorvastatin (LIPITOR) 80 MG tablet Take 80 mg by mouth at bedtime.   Yes [provider]  esomeprazole (NEXIUM) 40 MG capsule Take 40 mg by mouth 2 (two) times daily before a meal.    Yes [provider]  gabapentin (NEURONTIN) 300 MG capsule Take 300 mg by mouth 2 (two) times daily.   Yes [provider]  lisinopril (PRINIVIL,ZESTRIL) 40 MG tablet Take 40 mg by mouth daily.   Yes [provider]  metoprolol tartrate (LOPRESSOR) 25 MG tablet Take 50 mg by mouth 2 (two) times daily.    Yes [provider]  oxybutynin (DITROPAN) 5 MG tablet Take 1 tablet by mouth 3 (three) times daily as needed. 06/13/17  Yes [provider]  traMADol (ULTRAM) 50 MG tablet Take 50 mg by mouth every 6 (six) hours as needed for moderate pain.    Yes [provider]  Vitamin D, Ergocalciferol, (DRISDOL) 50000 units CAPS capsule Take 1 capsule by mouth once a week. 06/13/17  Yes [provider]  ALPRAZolam Duanne Moron) 0.5 MG tablet Take 1 tablet (0.5 mg total) by  mouth 2 (two) times daily as needed for anxiety. Patient not taking: Reported on 07/29/2017 06/28/17   Olean Ree, MD  oxyCODONE (OXY IR/ROXICODONE) 5 MG immediate release tablet Take 1 tablet (5 mg total) by mouth every 4 (four) hours as needed for moderate pain or severe pain. Patient not taking: Reported on 07/29/2017 06/28/17   Olean Ree, MD  polyethylene glycol (MIRALAX / GLYCOLAX) packet Take 17 g by mouth 2 (two) times daily. Patient not taking: Reported on 07/29/2017 06/28/17   Olean Ree, MD      ALLERGIES:  No Known Allergies   SOCIAL HISTORY:  Social History   Social History  . Marital status: Single    Spouse name: N/A  . Number of children: N/A  . Years of education: N/A   Occupational History  . Unemployed    Social History Main Topics  . Smoking status: Current Some Day Smoker    Packs/day: 0.50    Types: Cigarettes  . Smokeless tobacco: Never Used  . Alcohol use No  . Drug use: No  . Sexual activity: Not on file   Other Topics Concern  . Not on file   Social History Narrative   Lives in [place] with boyfriend.  Does not routinely exercise.    The patient currently resides (home / rehab facility / nursing home): Home The patient normally is (ambulatory / bedbound): Ambulatory   FAMILY HISTORY:  Family History  Problem Relation Age of Onset  . Hypertension Mother   . Heart disease Mother   . Heart attack Mother   . Breast cancer Mother   . Hypertension Father   . Breast cancer Paternal Aunt      REVIEW OF SYSTEMS:  Constitutional: denies weight loss, fever, chills, or sweats  Eyes: denies any other vision changes, history of eye injury  ENT: denies sore throat, hearing problems  Respiratory: denies shortness of breath, wheezing  Cardiovascular: denies chest pain, palpitations  Gastrointestinal: abdominal pain, N/V, and bowel function as per HPI Genitourinary: denies burning with urination or urinary frequency Musculoskeletal: denies any other joint pains or cramps  Skin: denies any other rashes or skin discolorations  Neurological: denies any other headache, dizziness, weakness  Psychiatric: denies any other depression, anxiety   All other review of systems were negative   VITAL SIGNS:  Temp:  [98.2 F (36.8 C)] 98.2 F (36.8 C) (10/09 1147) Pulse Rate:  [75-100] 92 (10/09 1147) Resp:  [18-20] 18 (10/09 0459) BP: (100-113)/(54-71) 104/71 (10/09 1147) SpO2:  [95 %-96 %] 96 % (10/09 1147)     Height: 5\' 4"  (162.6 cm) Weight: 231 lb  (104.8 kg) BMI (Calculated): 39.63   INTAKE/OUTPUT:  This shift: Total I/O In: 539.6 [I.V.:539.6] Out: 0   Last 2 shifts: @IOLAST2SHIFTS @   PHYSICAL EXAM:  Constitutional:  -- Obese body habitus  -- Awake, alert, and oriented x3  Eyes:  -- Pupils equally round and reactive to light  -- No scleral icterus  Ear, nose, and throat:  -- No jugular venous distension  Pulmonary:  -- No crackles  -- Equal breath sounds bilaterally -- Breathing non-labored at rest Cardiovascular:  -- S1, S2 present  -- No pericardial rubs Gastrointestinal:  -- Abdomen soft, obese, and non-distended with mild peri-colonic abdominal tenderness to palpation, no guarding or rebound tenderness -- No abdominal masses appreciated, pulsatile or otherwise  Musculoskeletal and Integumentary:  -- Wounds or skin discoloration: None appreciated -- Extremities: B/L UE and LE FROM, hands and feet warm  Neurologic:  -- Motor function: intact and symmetric -- Sensation: intact and symmetric  Labs:  CBC Latest Ref Rng & Units 07/29/2017 06/27/2017 06/26/2017  WBC 3.6 - 11.0 K/uL 4.9 7.8 6.6  Hemoglobin 12.0 - 16.0 g/dL 11.4(L) 10.3(L) 9.8(L)  Hematocrit 35.0 - 47.0 % 33.0(L) 30.6(L) 28.8(L)  Platelets 150 - 440 K/uL 255 372 305   CMP Latest Ref Rng & Units 07/30/2017 07/29/2017 06/27/2017  Glucose 65 - 99 mg/dL 111(H) 116(H) 107(H)  BUN 6 - 20 mg/dL 15 21(H) 18  Creatinine 0.44 - 1.00 mg/dL 1.11(H) 1.73(H) 0.79  Sodium 135 - 145 mmol/L 142 137 139  Potassium 3.5 - 5.1 mmol/L 3.8 4.2 4.6  Chloride 101 - 111 mmol/L 113(H) 105 103  CO2 22 - 32 mmol/L 25 23 27   Calcium 8.9 - 10.3 mg/dL 8.0(L) 8.7(L) 9.1  Total Protein 6.5 - 8.1 g/dL - 6.7 -  Total Bilirubin 0.3 - 1.2 mg/dL - 0.4 -  Alkaline Phos 38 - 126 U/L - 88 -  AST 15 - 41 U/L - 16 -  ALT 14 - 54 U/L - 13(L) -   Lactic Acid (07/29/2017): 0.8  Imaging studies:  CT Abdomen and Pelvis with Contrast (07/29/2017) - personally reviewed with patient and her family  bedside 1. Persistent but improved segment of mid small bowel inflammation / edema presumably related to partial SMA occlusion seen on CT 06/19/2017. Interval placement of a stent in the SMA with good filling distally. 2. No additional acute findings the abdomen pelvis. 3. No additional sites of thromboembolism or ischemia.  Assessment/Plan: (ICD-10's: K56.04) 56 y.o. female with acute on chronic constipation, complicated by focal enteritis at site of recent small bowel ischemia secondary to SMA thromboembolus s/p SMA stent placement without clinically significant stricture or SBO on abdominal CT yesterday and by pertinent comorbidities including obesity (BMI 40), HTN, HLD, CAD with diastolic CHF, aortic thrombus, Bell's Palsy, and cancer not otherwise specified.   - okay to advance to full liquids   - no indication for urgent surgical intervention at this time  - both acute and maintenance bowel regimen for constipation   - medical management of comorbidities per medical team  - DVT prophylaxis, ambulation encouraged  All of the above findings and recommendations were discussed with the patient and her family, and all of patient's and her family's questions were answered to their expressed satisfaction.  Thank you for the opportunity to participate in this patient's care.   -- Marilynne Drivers Rosana Hoes, MD, [place]: [facility] - Partnering for exceptional care. Office: [phone]

## 2017-07-31 NOTE — Progress Notes (Signed)
[place] at [place] NAME: Carla [name]    MR#:  [id number]  DATE OF BIRTH:  1961-01-28  SUBJECTIVE:  CHIEF COMPLAINT:  Patient is still reporting lower abdominal pain 8 out of 10. Last bowel movement was 10 days ago Reports nausea no vomiting.   REVIEW OF SYSTEMS:  CONSTITUTIONAL: No fever, fatigue or weakness.  EYES: No blurred or double vision.  EARS, NOSE, AND THROAT: No tinnitus or ear pain.  RESPIRATORY: No cough, shortness of breath, wheezing or hemoptysis.  CARDIOVASCULAR: No chest pain, orthopnea, edema.  GASTROINTESTINAL: Reporting lower abdominal pain with nausea but denies any vomiting or diarrhea  GENITOURINARY: No dysuria, hematuria.  ENDOCRINE: No polyuria, nocturia,  HEMATOLOGY: No anemia, easy bruising or bleeding SKIN: No rash or lesion. MUSCULOSKELETAL: No joint pain or arthritis.   NEUROLOGIC: No tingling, numbness, weakness.  PSYCHIATRY: No anxiety or depression.   DRUG ALLERGIES:  No Known Allergies  VITALS:  Blood pressure (!) 100/54, pulse 75, temperature 98.2 F (36.8 C), temperature source Oral, resp. rate 18, height 5\' 4"  (1.626 m), weight 104.8 kg (231 lb), SpO2 96 %.  PHYSICAL EXAMINATION:  GENERAL:  56 y.o.-year-old patient lying in the bed with no acute distress.  EYES: Pupils equal, round, reactive to light and accommodation. No scleral icterus. Extraocular muscles intact.  HEENT: Head atraumatic, normocephalic. Oropharynx and nasopharynx clear.  NECK:  Supple, no jugular venous distention. No thyroid enlargement, no tenderness.  LUNGS: Normal breath sounds bilaterally, no wheezing, rales,rhonchi or crepitation. No use of accessory muscles of respiration.  CARDIOVASCULAR: S1, S2 normal. No murmurs, rubs, or gallops.  ABDOMEN: Soft, Lower abdomen tender, no rebound tenderness nondistended. Bowel sounds present.  EXTREMITIES: No pedal edema, cyanosis, or clubbing.  NEUROLOGIC: Cranial nerves II  through XII are intact. Muscle strength 5/5 in all extremities. Sensation intact. Gait not checked.  PSYCHIATRIC: The patient is alert and oriented x 3.  SKIN: No obvious rash, lesion, or ulcer.    LABORATORY PANEL:   CBC  Recent Labs Lab 07/29/17 1131  WBC 4.9  HGB 11.4*  HCT 33.0*  PLT 255   ------------------------------------------------------------------------------------------------------------------  Chemistries   Recent Labs Lab 07/29/17 1131 07/30/17 0341  NA 137 142  K 4.2 3.8  CL 105 113*  CO2 23 25  GLUCOSE 116* 111*  BUN 21* 15  CREATININE 1.73* 1.11*  CALCIUM 8.7* 8.0*  AST 16  --   ALT 13*  --   ALKPHOS 88  --   BILITOT 0.4  --    ------------------------------------------------------------------------------------------------------------------  Cardiac Enzymes No results for input(s): TROPONINI in the last 168 hours. ------------------------------------------------------------------------------------------------------------------  RADIOLOGY:  Ct Abdomen Pelvis W Contrast  Result Date: 07/30/2017 CLINICAL DATA:  Abdominal pain for 6 weeks EXAM: CT ABDOMEN AND PELVIS WITH CONTRAST TECHNIQUE: Multidetector CT imaging of the abdomen and pelvis was performed using the standard protocol following bolus administration of intravenous contrast. CONTRAST:  130mL ISOVUE-300 IOPAMIDOL (ISOVUE-300) INJECTION 61% COMPARISON:  CT 06/19/2017 FINDINGS: Lower chest: Lung bases are clear. Hepatobiliary: No focal hepatic lesion. Postcholecystectomy. No biliary dilatation. Pancreas: Pancreas is normal. No ductal dilatation. No pancreatic inflammation. Spleen: Normal spleen Adrenals/urinary tract: Thickening of LEFT adrenal gland. On delayed imaging gland has Hounsfield units less than 10 consistent benign adenoma. Kidneys enhance symmetrically. No obstruction. Ureters and bladder normal Stomach/Bowel: Stomach and duodenum are normal. Mesenteric inflammation associated loop of  small bowel in the upper pelvis. Inflammation occurs over approximately 5 cm segment and associated with  mild bowel wall thickening and edema. (Image 64, series 6 and image 61, series 2). This is site of prior of focal ischemia of the small bowel. The bowel wall thickening is improved compared to prior. Inflammation similar. No additional small bowel lesions are present. Terminal ileum is normal. Appendix not identified. Appendix not identified. The: And rectosigmoid colon are normal. Vascular/Lymphatic: Stent within the superior mesenteric artery which fills distally to the stent. Celiac trunk patent. IMA patent minimal atherosclerotic calcification. Reproductive: Post hysterectomy Other: No free fluid. Musculoskeletal: No aggressive osseous lesion. IMPRESSION: 1. Persistent but improved segment of mid small bowel inflammation / edema presumably related to partial SMA occlusion seen on CT 06/19/2017. Interval placement of a stent in the SMA with good filling distally. 2. No additional acute findings the abdomen pelvis. 3. No additional sites of thromboembolism or ischemia. Electronically Signed   By: Suzy Bouchard M.D.   On: 07/30/2017 16:09   Acute Abdominal Series  Result Date: 07/29/2017 CLINICAL DATA:  Abdominal pain. EXAM: DG ABDOMEN ACUTE W/ 1V CHEST COMPARISON:  CT abdomen pelvis and chest x-ray dated June 19, 2017. FINDINGS: There are a few loops of borderline dilated small bowel in the left upper quadrant. No air-fluid levels. Air and stool are noted throughout the colon. No pneumoperitoneum. Prior cholecystectomy. Proximal SMA stent. Surgical clips in the right pelvis. No acute osseous abnormality. Heart size and mediastinal contours are within normal limits. Both lungs are clear. IMPRESSION: 1. Few loops of borderline dilated small bowel in the left upper quadrant, nonspecific, and could reflect enteritis. No definite evidence of obstruction. 2.  No active cardiopulmonary disease. Electronically  Signed   By: Titus Dubin M.D.   On: 07/29/2017 16:54    EKG:   Orders placed or performed during the hospital encounter of 07/29/17  . EKG 12-Lead  . EKG 12-Lead  . ED EKG  . ED EKG  . EKG 12-Lead  . EKG 12-Lead    ASSESSMENT AND PLAN:    1 acute on chronic abdominal pain  noted recent history of SMA embolism s/p thrombectomy/stent placement by IR 4-5 weeks ago, poor appetite, with complaint of constipation Will give laxatives for constipation Urinalysis with trace leukoesterase but negative nitrites. Urine culture greater than 100,000 colonies of gram-negative rods. Will start her on IV Rocephin  Normal lactic acid level sedimentation rate above  normal range at 49 urine drug screen-negative CT abdomen which was performed yesterday Consult Gen. 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-status post treatment above Continue Eliquis Discussed with vascular surgery no interventions are needed at this time from the standpoint and recommended general surgery consult for acute abdominal pain  3 acute kidney injury prerenal from poor by mouth intake and ACE inhibitor use-  renal function improved with creatinine 1.11 today ,Baseline renal function is normal  clinically hypovolemic hold ACE inhibitor ACE inhibitor, avoid nephrotoxic agents, IV fluids for rehydration  repeat BMP in the morning  4 acute on chronic constipation Continue MiraLAX when necessary, lactulose by mouth 1 now, check acute abdominal series for further evaluation  5 chronic GERD without esophagitis PPI daily  6 chronic tobacco smoking abuse/dependency Nicotine patch daily with cessation counseling ordered  Full code  DVT prophylaxis-on Eliquis  All the records are reviewed and case discussed with Care Management/Social Workerr. Management plans discussed with the patient, family and they are in agreement.  CODE STATUS: FC   TOTAL TIME TAKING CARE OF THIS PATIENT: 35  minutes.  POSSIBLE D/C IN 2-3  DAYS, DEPENDING ON CLINICAL CONDITION.  Note: This dictation was prepared with Dragon dictation along with smaller phrase technology. Any transcriptional errors that result from this process are unintentional.   Nicholes Mango M.D on 07/31/2017 at 10:54 AM  Between 7am to 6pm - Pager - [phone] After 6pm go to [email or web address] - password [email or web address] [facility]  Office  [phone]  CC: Primary care physician; Patient, No Pcp Per

## 2017-08-01 LAB — URINE CULTURE: Culture: >=100000 — AB

## 2017-08-01 MED ORDER — MAGNESIUM HYDROXIDE 400 MG/5ML PO SUSP
960.0000 mL | Freq: Once | ORAL | Status: AC
  Administered 2017-08-01: 960 mL via RECTAL
  Filled 2017-08-01: qty 473

## 2017-08-01 MED ORDER — LACTULOSE 10 GM/15ML PO SOLN
30.0000 g | Freq: Once | ORAL | Status: AC
  Administered 2017-08-01: 30 g via ORAL

## 2017-08-01 MED ORDER — LACTULOSE 10 GM/15ML PO SOLN
30.0000 g | Freq: Four times a day (QID) | ORAL | Status: DC
  Administered 2017-08-01 – 2017-08-02 (×3): 30 g via ORAL
  Filled 2017-08-01 (×3): qty 60

## 2017-08-01 NOTE — Progress Notes (Signed)
[place] at [place] NAME: Carla [name]    MR#:  [id number]  DATE OF BIRTH:  24-Jan-1961  SUBJECTIVE:  CHIEF COMPLAINT:  Patient is still reporting lower abdominal pain 8 out of 10. Last bowel movement was 12 days ago   With enema patient had very small bowel movement according to the nurse   REVIEW OF SYSTEMS:  CONSTITUTIONAL: No fever, fatigue or weakness.  EYES: No blurred or double vision.  EARS, NOSE, AND THROAT: No tinnitus or ear pain.  RESPIRATORY: No cough, shortness of breath, wheezing or hemoptysis.  CARDIOVASCULAR: No chest pain, orthopnea, edema.  GASTROINTESTINAL: Reporting lower abdominal pain with nausea but denies any vomiting or diarrhea  GENITOURINARY: No dysuria, hematuria.  ENDOCRINE: No polyuria, nocturia,  HEMATOLOGY: No anemia, easy bruising or bleeding SKIN: No rash or lesion. MUSCULOSKELETAL: No joint pain or arthritis.   NEUROLOGIC: No tingling, numbness, weakness.  PSYCHIATRY: No anxiety or depression.   DRUG ALLERGIES:  No Known Allergies  VITALS:  Blood pressure (!) 148/86, pulse 99, temperature 98.1 F (36.7 C), temperature source Oral, resp. rate 18, height 5\' 4"  (1.626 m), weight 104.8 kg (231 lb), SpO2 93 %.  PHYSICAL EXAMINATION:  GENERAL:  56 y.o.-year-old patient lying in the bed with no acute distress.  EYES: Pupils equal, round, reactive to light and accommodation. No scleral icterus. Extraocular muscles intact.  HEENT: Head atraumatic, normocephalic. Oropharynx and nasopharynx clear.  NECK:  Supple, no jugular venous distention. No thyroid enlargement, no tenderness.  LUNGS: Normal breath sounds bilaterally, no wheezing, rales,rhonchi or crepitation. No use of accessory muscles of respiration.  CARDIOVASCULAR: S1, S2 normal. No murmurs, rubs, or gallops.  ABDOMEN: Soft, Lower abdomen tender, no rebound tenderness nondistended. Bowel sounds present.  EXTREMITIES: No pedal edema, cyanosis, or  clubbing.  NEUROLOGIC: Cranial nerves II through XII are intact. Muscle strength 5/5 in all extremities. Sensation intact. Gait not checked.  PSYCHIATRIC: The patient is alert and oriented x 3.  SKIN: No obvious rash, lesion, or ulcer.    LABORATORY PANEL:   CBC  Recent Labs Lab 07/29/17 1131  WBC 4.9  HGB 11.4*  HCT 33.0*  PLT 255   ------------------------------------------------------------------------------------------------------------------  Chemistries   Recent Labs Lab 07/29/17 1131 07/30/17 0341  NA 137 142  K 4.2 3.8  CL 105 113*  CO2 23 25  GLUCOSE 116* 111*  BUN 21* 15  CREATININE 1.73* 1.11*  CALCIUM 8.7* 8.0*  AST 16  --   ALT 13*  --   ALKPHOS 88  --   BILITOT 0.4  --    ------------------------------------------------------------------------------------------------------------------  Cardiac Enzymes No results for input(s): TROPONINI in the last 168 hours. ------------------------------------------------------------------------------------------------------------------  RADIOLOGY:  No results found.  EKG:   Orders placed or performed during the hospital encounter of 07/29/17  . EKG 12-Lead  . EKG 12-Lead  . ED EKG  . ED EKG  . EKG 12-Lead  . EKG 12-Lead    ASSESSMENT AND PLAN:    1 acute on chronic abdominal pain  noted recent history of SMA embolism s/p thrombectomy/stent placement by IR 4-5 weeks ago, poor appetite, with complaint of constipation Will give laxatives for constipation Urinalysis with trace leukoesterase but negative nitrites. Urine culture greater than 100,000 colonies of gram-negative rods. on IV Rocephin  Normal lactic acid level sedimentation rate above  normal range at 49 urine drug screen-negative CT abdomen was performed during this hospital course   Gen. 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-status post  treatment above Continue Eliquis Discussed with vascular surgery no interventions are needed at this time from the standpoint and recommended general surgery consult for acute abdominal pain  3 acute kidney injury prerenal from poor by mouth intake and ACE inhibitor use-  renal function improved with creatinine 1.11 today ,Baseline renal function is normal  clinically hypovolemic hold ACE inhibitor ACE inhibitor, avoid nephrotoxic agents, IV fluids for rehydration  repeat BMP in the morning  4 acute on chronic constipation Continue MiraLAX when necessary, lactulose by mouth 1 now, check acute abdominal series for further evaluation  5 chronic GERD without esophagitis PPI daily  6 chronic tobacco smoking abuse/dependency Nicotine patch daily with cessation counseling ordered  Full code  DVT prophylaxis-on Eliquis  All the records are reviewed and case discussed with Care Management/Social Workerr. Management plans discussed with the patient, family and they are in agreement.  CODE STATUS: FC   TOTAL TIME TAKING CARE OF THIS PATIENT: 35 minutes.   POSSIBLE D/C IN 2  DAYS, DEPENDING ON CLINICAL CONDITION.  Note: This dictation was prepared with Dragon dictation along with smaller phrase technology. Any transcriptional errors that result from this process are unintentional.   Nicholes Mango M.D on 08/01/2017 at 4:14 PM  Between 7am to 6pm - Pager - [phone] After 6pm go to [email or web address] - password [email or web address] [facility]  Office  [phone]  CC: Primary care physician; Patient, No Pcp Per

## 2017-08-01 NOTE — Progress Notes (Signed)
Patient is unable to tolerate the SMOG enema.  She groans in pain when the lubricated enema is inserted in her rectum.  She is only able to hold a small amount of enema for about 3-5 minutes.  Dr patel notified and order received for lactulose

## 2017-08-01 NOTE — Progress Notes (Signed)
SURGICAL PROGRESS NOTE (cpt [phone])  Hospital Day(s): 3.   Post op day(s):  [name]   Interval History: Patient seen and examined, no acute events or new complaints overnight. Patient continues to report crampy intermittent abdominal pain with +flatus and tolerating full liquids diet (though complains about its taste), denies N/V, abdominal distention, fever/chills, CP, or SOB.  Review of Systems:  Constitutional: denies fever, chills  HEENT: denies cough or congestion  Respiratory: denies any shortness of breath  Cardiovascular: denies chest pain or palpitations  Gastrointestinal: abdominal pain, N/V, and bowel function as per interval history Genitourinary: denies burning with urination or urinary frequency Musculoskeletal: denies pain, decreased motor or sensation Integumentary: denies any other rashes or skin discolorations Neurological: denies HA or vision/hearing changes   Vital signs in last 24 hours: [min-max] current  Temp:  [98.2 F (36.8 C)-98.4 F (36.9 C)] 98.2 F (36.8 C) (10/10 0432) Pulse Rate:  [79-105] 79 (10/10 0432) Resp:  [16-18] 18 (10/10 0432) BP: (104-142)/(59-74) 115/59 (10/10 0432) SpO2:  [96 %-100 %] 97 % (10/10 0432)     Height: 5\' 4"  (162.6 cm) Weight: 231 lb (104.8 kg) BMI (Calculated): 39.63   Intake/Output this shift:  No intake/output data recorded.   Intake/Output last 2 shifts:  @IOLAST2SHIFTS @   Physical Exam:  Constitutional: alert, cooperative and no distress  HENT: normocephalic without obvious abnormality  Eyes: PERRL, EOM's grossly intact and symmetric  Neuro: CN II - XII grossly intact and symmetric without deficit  Respiratory: breathing non-labored at rest  Cardiovascular: regular rate and sinus rhythm  Gastrointestinal: soft, obese, and non-distended with mild peri-colonic tenderness to palpation, no guarding or rebound tenderness Musculoskeletal: UE and LE FROM, motor and sensation grossly intact, NT   Labs:  CBC Latest Ref Rng  & Units 07/29/2017 06/27/2017 06/26/2017  WBC 3.6 - 11.0 K/uL 4.9 7.8 6.6  Hemoglobin 12.0 - 16.0 g/dL 11.4(L) 10.3(L) 9.8(L)  Hematocrit 35.0 - 47.0 % 33.0(L) 30.6(L) 28.8(L)  Platelets 150 - 440 K/uL 255 372 305   CMP Latest Ref Rng & Units 07/30/2017 07/29/2017 06/27/2017  Glucose 65 - 99 mg/dL 111(H) 116(H) 107(H)  BUN 6 - 20 mg/dL 15 21(H) 18  Creatinine 0.44 - 1.00 mg/dL 1.11(H) 1.73(H) 0.79  Sodium 135 - 145 mmol/L 142 137 139  Potassium 3.5 - 5.1 mmol/L 3.8 4.2 4.6  Chloride 101 - 111 mmol/L 113(H) 105 103  CO2 22 - 32 mmol/L 25 23 27   Calcium 8.9 - 10.3 mg/dL 8.0(L) 8.7(L) 9.1  Total Protein 6.5 - 8.1 g/dL - 6.7 -  Total Bilirubin 0.3 - 1.2 mg/dL - 0.4 -  Alkaline Phos 38 - 126 U/L - 88 -  AST 15 - 41 U/L - 16 -  ALT 14 - 54 U/L - 13(L) -   Imaging studies: No new pertinent imaging studies   Assessment/Plan: (ICD-10's: K70.04) 56 y.o. 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              - continue full liquids for now             - no indication for urgent surgical intervention at this time             -  both acute and maintenance bowel regimens for constipation (enema + lactulose PO ordered)             - medical management of comorbidities per medical team             - DVT prophylaxis, ambulation encouraged  All of the above findings and recommendations were discussed with the patient and her family, and all of patient's and her family's questions were answered to their expressed satisfaction.  Thank you for the opportunity to participate in  this patient's care.   -- Marilynne Drivers Rosana Hoes, MD, [place]: [facility] - Partnering for exceptional care. Office: [phone]

## 2017-08-01 NOTE — Progress Notes (Signed)
Pt was able to tolerate less than half of SMOG enema and had minimal results. Pt complaining of abd pain and was given Tylenol.

## 2017-08-02 DIAGNOSIS — R1084 Generalized abdominal pain: Secondary | ICD-10-CM

## 2017-08-02 LAB — CBC
HCT: 32.9 % — ABNORMAL LOW (ref 35.0–47.0)
Hemoglobin: 11.2 g/dL — ABNORMAL LOW (ref 12.0–16.0)
MCH: 29.7 pg (ref 26.0–34.0)
MCHC: 34.1 g/dL (ref 32.0–36.0)
MCV: 87 fL (ref 80.0–100.0)
Platelets: 236 10*3/uL (ref 150–440)
RBC: 3.79 MIL/uL — ABNORMAL LOW (ref 3.80–5.20)
RDW: 14.9 % — ABNORMAL HIGH (ref 11.5–14.5)
WBC: 5.3 10*3/uL (ref 3.6–11.0)

## 2017-08-02 LAB — BASIC METABOLIC PANEL
Anion gap: 6 (ref 5–15)
BUN: 7 mg/dL (ref 6–20)
CO2: 26 mmol/L (ref 22–32)
Calcium: 8.8 mg/dL — ABNORMAL LOW (ref 8.9–10.3)
Chloride: 113 mmol/L — ABNORMAL HIGH (ref 101–111)
Creatinine, Ser: 1.15 mg/dL — ABNORMAL HIGH (ref 0.44–1.00)
GFR calc Af Amer: >60 mL/min (ref >60)
GFR calc non Af Amer: 52 mL/min — ABNORMAL LOW (ref >60)
Glucose, Bld: 104 mg/dL — ABNORMAL HIGH (ref 65–99)
Potassium: 3.4 mmol/L — ABNORMAL LOW (ref 3.5–5.1)
Sodium: 145 mmol/L (ref 135–145)

## 2017-08-02 MED ORDER — POLYETHYLENE GLYCOL 3350 17 G PO PACK: 17.0000 g | PACK | Freq: Every day | ORAL | 0 refills | Status: DC

## 2017-08-02 MED ORDER — CEPHALEXIN 500 MG PO CAPS
500.0000 mg | ORAL_CAPSULE | Freq: Two times a day (BID) | ORAL | Status: DC
  Administered 2017-08-02: 500 mg via ORAL
  Filled 2017-08-02: qty 1

## 2017-08-02 MED ORDER — NICOTINE 14 MG/24HR TD PT24: 14.0000 mg | MEDICATED_PATCH | Freq: Every day | TRANSDERMAL | 0 refills | Status: DC

## 2017-08-02 MED ORDER — CEPHALEXIN 500 MG PO CAPS: 500.0000 mg | ORAL_CAPSULE | Freq: Two times a day (BID) | ORAL | 0 refills | Status: DC

## 2017-08-02 MED ORDER — ACETAMINOPHEN 325 MG PO TABS: 650.0000 mg | ORAL_TABLET | Freq: Four times a day (QID) | ORAL | Status: DC | PRN

## 2017-08-02 NOTE — Progress Notes (Signed)
Chaplain made a follow up visit with pt. CH met pt and family at bedside. Pt talked about her health challenges. Pt spoke about her interpersonal relationship, the loss her mother, and concerns about her son and grandchildren. Pt appeared emotional and tearful when she talked about the loss of her mother and not being able to see her children and grandchildren regularly. CH validated pt's feelings and losses, provided prayer and pastoral presence.    08/02/17 1200  Clinical Encounter Type  Visited With Patient and family together  Visit Type Follow-up  Referral From Chaplain  Consult/Referral To Chaplain  Spiritual Encounters  Spiritual Needs Prayer;Other (Comment)   

## 2017-08-02 NOTE — Care Management (Signed)
Patient discharging home today.  Patient to discharge with PO keflex.  Rx faxed to Medication Management, and patient to pick it up after discharge.  RNCM signing off.

## 2017-08-02 NOTE — Progress Notes (Signed)
Pt alert and oriented x4, no complaints of pain or discomfort.  Bed in low position, call bell within reach.  Bed alarms on and functioning.  Assessment done and charted.  Will continue to monitor and do hourly rounding throughout the shift 

## 2017-08-02 NOTE — Progress Notes (Signed)
CC: Abdominal pain Subjective: Patient continues to have abdominal pain but it is better. She's had some nausea but no emesis she is on the telephone at the time of this interview.  Objective: Vital signs in last 24 hours: Temp:  [98.2 F (36.8 C)-98.6 F (37 C)] 98.2 F (36.8 C) (10/11 0456) Pulse Rate:  [100-105] 100 (10/11 0456) Resp:  [16] 16 (10/11 0456) BP: (144-153)/(79-92) 153/79 (10/11 0456) SpO2:  [100 %] 100 % (10/11 0456) Last BM Date: 08/02/17  Intake/Output from previous day: 10/10 0701 - 10/11 0700 In: 50 [IV Piggyback:50] Out: 300 [Urine:300] Intake/Output this shift: No intake/output data recorded.  Physical exam:  No icterus no jaundice. Vital signs reviewed and stable. Abdomen is soft and minimally tender no guarding or rebound tenderness. Calves are nontender no icterus no jaundice  Lab Results: CBC   Recent Labs  08/02/17 0425  WBC 5.3  HGB 11.2*  HCT 32.9*  PLT 236   BMET  Recent Labs  08/02/17 0425  NA 145  K 3.4*  CL 113*  CO2 26  GLUCOSE 104*  BUN 7  CREATININE 1.15*  CALCIUM 8.8*   PT/INR No results for input(s): LABPROT, INR in the last 72 hours. ABG No results for input(s): PHART, HCO3 in the last 72 hours.  Invalid input(s): PCO2, PO2  Studies/Results: No results found.  Anti-infectives: Anti-infectives    Start     Dose/Rate Route Frequency Ordered Stop   08/02/17 1100  cephALEXin (KEFLEX) capsule 500 mg     500 mg Oral Every 12 hours 08/02/17 1049     07/31/17 1115  cefTRIAXone (ROCEPHIN) 1 g in dextrose 5 % 50 mL IVPB  Status:  Discontinued     1 g 100 mL/hr over 30 Minutes Intravenous Daily 07/31/17 1102 08/02/17 1049      Assessment/Plan:  Abdominal pain likely secondary to ischemia. No sign of irreversible ischemia at this time however patient's exam is tender but without peritoneal signs and her labs are normal and personally reviewed. I would recommend continued observation only.  Florene Glen, MD,  FACS  08/02/2017

## 2017-08-02 NOTE — Progress Notes (Signed)
Discharge: Pt d/c from room via wheelchair, Family member with the pt. Discharge instructions given to the patient and family members.  No questions from pt, reintegrated to the pt to call or go to the ED for chest discomfort. Pt dressed in street clothes and left with discharge papers and prescriptions in hand. IV d/ced, tele removed and no complaints of pain or discomfort. 

## 2017-08-02 NOTE — Progress Notes (Signed)
Pain noted   0 /10.  Pt told of discharge impending dr orders

## 2017-08-02 NOTE — Discharge Summary (Signed)
[facility] NAME: Carla [name]    MR#:  [id number]  DATE OF BIRTH:  1960-10-25  DATE OF ADMISSION:  07/29/2017 ADMITTING PHYSICIAN: Avel Peace Salary, MD  DATE OF DISCHARGE: 08/02/17  PRIMARY CARE PHYSICIAN: Carla [name]    ADMISSION DIAGNOSIS:  Hypotension, unspecified hypotension type [I95.9] Acute renal failure, unspecified acute renal failure type ([place]) [N17.9]  DISCHARGE DIAGNOSIS:  Principal Problem:   Abdominal pain Active Problems:   AKI (acute kidney injury) ([place])   Hypotension UTI Severe constipation  SECONDARY DIAGNOSIS:   Past Medical History:  Diagnosis Date  . Abdominal aortic atherosclerosis ([place])    a. 05/2017 CTA abd/pelvis: significant atherosclerotic dzs of the infrarenal abd Ao w/ some mural thrombus. No aneurysm or dissection.  . Baker's cyst of knee, right    a. 07/2016 U/S: 4.1 x 1.4 x 2.9 cystic structure in R poplitetal fossa.  . Bell's palsy   . Cancer ([place])   . Chest pain    a. 08/2012 Lexiscan MV: EF 54%, non ischemia/infarct.  . Diastolic dysfunction    a. 05/2017 Echo: Ef 60-65%, no rwma, Gr1 DD, no source of cardiac emboli.  . 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 05/2017 CTA Abd/pelvis: apparent thrombus or embolus in prox SMA (70-90%); b. 05/2017 catheter directed tPA, mechanical thrombectomy, and stenting of the SMA.  [name] GERD (gastroesophageal reflux disease)   . Hyperlipidemia   . Hypertension   . Morbid obesity with BMI of 40.0-44.9, adult [facility])     HOSPITAL COURSE:   HISTORY OF PRESENT ILLNESS: Carla [name]  is a 56 y.o. female with below past medical history, discharged early last month for superior mesenteric artery embolism and status post thrombectomy with stent placement by IR, subsequently treated with Eliquis s/p discharge, presents today with continued intermittent lower abdominal pain radiating across her umbilicus since prior to her admission in August  of this year, complains of inability to tolerate by mouth, constipation greater than a week despite use of Ex-Lax, in the emergency room patient was found to be hypotensive with systolic blood pressure in the 80s, creatinine 1.5 at baseline normal renal function, low suspicion for recurrence of intra-abdominal thrombosis though cannot be excluded entirely, Hospital services clinic contacted further evaluation/care. Patient violated the bedside emergency room, does not appear to be in any acute distress, resting comfortably in bed, patient is complaining of abdominal pain out of proportion to physical examination, contrast study unable to be done given acute kidney injury, patient denies any fevers, chills, night sweats, weakness, fatigue, emesis, nausea, diarrhea, chest pain, shortness of breath, dysuria, patient now being admitted for acute on chronic abdominal pain, acute kidney injury, and hypotension.   1acute on chronic abdominal pain  noted recent history of SMAembolism s/pthrombectomy/stent placement by IR 4-5weeks ago, poor appetite, with complaint of constipation Urinalysis with trace leukoesterase but negative nitrites. Urine culture greater than 100,000 colonies of gram-negative rods Klebsiella, Treated with IV Rocephin , Normal lactic acid level; sensitive to Rocephin and cefazolin. Will discharge with by mouth Keflex  sedimentation rate above  normal range at 49 urine drug screen-negative CT abdomen was performed during this hospital course   Gen. Surgery Dr. Burt Knack thinks abdominal pain is likely secondary to ischemia but no signs of reversible ischemia, he is recommending no interventions at this time but treating constipation and UTI and continue to observe the patient Laxatives and enemas were given and patient  had multiple bowel movements today, clinically feels better  2subacute SMAembolism Diagnosis in August 2018-status post treatment above Continue Eliquis Discussed with  vascular surgery no interventions are needed at this time from the standpoint and recommended general surgery consult for acute abdominal pain. Please see surgery recommendations as above  3acute kidney injury prerenal from poor by mouth intake and ACE inhibitor use-  renal function improved with creatinine 1.11 ,Baseline renal function is normal  clinically hypovolemic hold ACE inhibitor , avoid nephrotoxic agents, IV fluids for rehydration provided    4acute on chronic constipation Continue MiraLAX when necessary, lactulose by mouth 1 now, check acute abdominal series for further evaluation  5chronic GERD without esophagitis PPI daily  6chronic tobacco smoking abuse/dependency Nicotine patch daily with cessation counseling ordered  7. Hypertension Currently patient's blood pressure is soft holding ACE inhibitor and metoprolol  Full code  DVT prophylaxis-on Eliquis  DISCHARGE CONDITIONS:   Stable  CONSULTS OBTAINED:  Treatment Team:  Nicholes Mango, MD Algernon Huxley, MD Vickie Epley, MD   PROCEDURES None  DRUG ALLERGIES:  No Known Allergies  DISCHARGE MEDICATIONS:   Current Discharge Medication List    START taking these medications   Details  acetaminophen (TYLENOL) 325 MG tablet Take 2 tablets (650 mg total) by mouth every 6 (six) hours as needed for mild pain (or Fever >/= 101).    cephALEXin (KEFLEX) 500 MG capsule Take 1 capsule (500 mg total) by mouth every 12 (twelve) hours. Qty: 10 capsule, Refills: 0    nicotine (NICODERM CQ - DOSED IN MG/24 HOURS) 14 mg/24hr patch Place 1 patch (14 mg total) onto the skin daily. Qty: 28 patch, Refills: 0      CONTINUE these medications which have CHANGED   Details  polyethylene glycol (MIRALAX / GLYCOLAX) packet Take 17 g by mouth daily. Qty: 14 each, Refills: 0      CONTINUE these medications which have NOT CHANGED   Details  apixaban (ELIQUIS) 5 MG TABS tablet Take 1 tablet (5 mg total) by mouth 2  (two) times daily. Qty: 60 tablet, Refills: 5    atorvastatin (LIPITOR) 80 MG tablet Take 80 mg by mouth at bedtime.    esomeprazole (NEXIUM) 40 MG capsule Take 40 mg by mouth 2 (two) times daily before a meal.     gabapentin (NEURONTIN) 300 MG capsule Take 300 mg by mouth 2 (two) times daily.    oxybutynin (DITROPAN) 5 MG tablet Take 1 tablet by mouth 3 (three) times daily as needed.    traMADol (ULTRAM) 50 MG tablet Take 50 mg by mouth every 6 (six) hours as needed for moderate pain.     Vitamin D, Ergocalciferol, (DRISDOL) 50000 units CAPS capsule Take 1 capsule by mouth once a week.    ALPRAZolam (XANAX) 0.5 MG tablet Take 1 tablet (0.5 mg total) by mouth 2 (two) times daily as needed for anxiety. Qty: 10 tablet, Refills: 0      STOP taking these medications     lisinopril (PRINIVIL,ZESTRIL) 40 MG tablet      metoprolol tartrate (LOPRESSOR) 25 MG tablet      oxyCODONE (OXY IR/ROXICODONE) 5 MG immediate release tablet          DISCHARGE INSTRUCTIONS:   Follow-up with primary care physician in a week; refer to  general surgery if needed   DIET:  Cardiac diet  DISCHARGE CONDITION:  Stable  ACTIVITY:  Activity as tolerated  OXYGEN:  Home Oxygen: No.   Oxygen  Delivery: room air  DISCHARGE LOCATION:  home   If you experience worsening of your admission symptoms, develop shortness of breath, life threatening emergency, suicidal or homicidal thoughts you must seek medical attention immediately by calling 911 or calling your MD immediately  if symptoms less severe.  You Must read complete instructions/literature along with all the possible adverse reactions/side effects for all the Medicines you take and that have been prescribed to you. Take any new Medicines after you have completely understood and accpet all the possible adverse reactions/side effects.   Please note  You were cared for by a hospitalist during your hospital stay. If you have any questions about  your discharge medications or the care you received while you were in the hospital after you are discharged, you can call the unit and asked to speak with the hospitalist on call if the hospitalist that took care of you is not available. Once you are discharged, your primary care physician will handle any further medical issues. Please note that NO REFILLS for any discharge medications will be authorized once you are discharged, as it is imperative that you return to your primary care physician (or establish a relationship with a primary care physician if you do not have one) for your aftercare needs so that they can reassess your need for medications and monitor your lab values.     Today  Chief Complaint  Patient presents with  . Abdominal Pain   Patient's constipation resolved and had multiple bowel movements today. Feeling much better Tolerating diet and wants to go home. Patient's son a bedsidet   ROS:  CONSTITUTIONAL: Denies fevers, chills. Denies any fatigue, weakness.  EYES: Denies blurry vision, double vision, eye pain. EARS, NOSE, THROAT: Denies tinnitus, ear pain, hearing loss. RESPIRATORY: Denies cough, wheeze, shortness of breath.  CARDIOVASCULAR: Denies chest pain, palpitations, edema.  GASTROINTESTINAL: Denies nausea, vomiting, diarrhea, abdominal pain. Denies bright red blood [name] rectum. GENITOURINARY: Denies dysuria, hematuria. ENDOCRINE: Denies nocturia or thyroid problems. HEMATOLOGIC AND LYMPHATIC: Denies easy bruising or bleeding. SKIN: Denies rash or lesion. MUSCULOSKELETAL: Denies pain in neck, back, shoulder, knees, hips or arthritic symptoms.  NEUROLOGIC: Denies paralysis, paresthesias.  PSYCHIATRIC: Denies anxiety or depressive symptoms.   VITAL SIGNS:  Blood pressure 122/68, pulse 87, temperature 98.1 F (36.7 C), temperature source Oral, resp. rate 16, height 5\' 4"  (1.626 m), weight 104.8 kg (231 lb), SpO2 98 %.  I/O:    Intake/Output Summary (Last 24  hours) at 08/02/17 1441 Last data filed at 08/02/17 1417  Gross [name] 24 hour  Intake              120 ml  Output              300 ml  Net             -180 ml    PHYSICAL EXAMINATION:  GENERAL:  56 y.o.-year-old patient lying in the bed with no acute distress.  EYES: Pupils equal, round, reactive to light and accommodation. No scleral icterus. Extraocular muscles intact.  HEENT: Head atraumatic, normocephalic. Oropharynx and nasopharynx clear.  NECK:  Supple, no jugular venous distention. No thyroid enlargement, no tenderness.  LUNGS: Normal breath sounds bilaterally, no wheezing, rales,rhonchi or crepitation. No use of accessory muscles of respiration.  CARDIOVASCULAR: S1, S2 normal. No murmurs, rubs, or gallops.  ABDOMEN: Soft, non-tender, non-distended. Bowel sounds present. No organomegaly or mass.  EXTREMITIES: No pedal edema, cyanosis, or clubbing.  NEUROLOGIC: Cranial nerves II through XII  are intact. Muscle strength 5/5 in all extremities. Sensation intact. Gait not checked.  PSYCHIATRIC: The patient is alert and oriented x 3.  SKIN: No obvious rash, lesion, or ulcer.   DATA REVIEW:   CBC  Recent Labs Lab 08/02/17 0425  WBC 5.3  HGB 11.2*  HCT 32.9*  PLT 236    Chemistries   Recent Labs Lab 07/29/17 1131  08/02/17 0425  NA 137  < > 145  K 4.2  < > 3.4*  CL 105  < > 113*  CO2 23  < > 26  GLUCOSE 116*  < > 104*  BUN 21*  < > 7  CREATININE 1.73*  < > 1.15*  CALCIUM 8.7*  < > 8.8*  AST 16  --   --   ALT 13*  --   --   ALKPHOS 88  --   --   BILITOT 0.4  --   --   < > = values in this interval not displayed.  Cardiac Enzymes No results for input(s): TROPONINI in the last 168 hours.  Microbiology Results  Results for orders placed or performed during the hospital encounter of 07/29/17  Urine culture     Status: Abnormal   Collection Time: 07/29/17  1:30 PM  Result Value Ref Range Status   Specimen Description URINE, CLEAN CATCH  Final   Special Requests  NONE  Final   Culture >=100,000 COLONIES/mL KLEBSIELLA PNEUMONIAE (A)  Final   Report Status 08/01/2017 FINAL  Final   Organism ID, Bacteria KLEBSIELLA PNEUMONIAE (A)  Final      Susceptibility   Klebsiella pneumoniae - MIC*    AMPICILLIN >=32 RESISTANT Resistant     CEFAZOLIN <=4 SENSITIVE Sensitive     CEFTRIAXONE <=1 SENSITIVE Sensitive     CIPROFLOXACIN <=0.25 SENSITIVE Sensitive     GENTAMICIN <=1 SENSITIVE Sensitive     IMIPENEM <=0.25 SENSITIVE Sensitive     NITROFURANTOIN 64 INTERMEDIATE Intermediate     TRIMETH/SULFA <=20 SENSITIVE Sensitive     AMPICILLIN/SULBACTAM 4 SENSITIVE Sensitive     PIP/TAZO <=4 SENSITIVE Sensitive     Extended ESBL NEGATIVE Sensitive     * >=100,000 COLONIES/mL KLEBSIELLA PNEUMONIAE  Culture, blood (routine x 2)     Status: None (Preliminary result)   Collection Time: 07/29/17  4:41 PM  Result Value Ref Range Status   Specimen Description BLOOD LEFT ANTECUBITAL  Final   Special Requests   Final    BOTTLES DRAWN AEROBIC AND ANAEROBIC Blood Culture adequate volume   Culture NO GROWTH 4 DAYS  Final   Report Status PENDING  Incomplete  Culture, blood (routine x 2)     Status: None (Preliminary result)   Collection Time: 07/29/17  4:54 PM  Result Value Ref Range Status   Specimen Description BLOOD BLOOD RIGHT HAND  Final   Special Requests   Final    BOTTLES DRAWN AEROBIC AND ANAEROBIC Blood Culture results may not be optimal due to an inadequate volume of blood received in culture bottles   Culture NO GROWTH 4 DAYS  Final   Report Status PENDING  Incomplete    RADIOLOGY:  Ct Abdomen Pelvis W Contrast  Result Date: 07/30/2017 CLINICAL DATA:  Abdominal pain for 6 weeks EXAM: CT ABDOMEN AND PELVIS WITH CONTRAST TECHNIQUE: Multidetector CT imaging of the abdomen and pelvis was performed using the standard protocol following bolus administration of intravenous contrast. CONTRAST:  147mL ISOVUE-300 IOPAMIDOL (ISOVUE-300) INJECTION 61% COMPARISON:  CT  06/19/2017 FINDINGS:  Lower chest: Lung bases are clear. Hepatobiliary: No focal hepatic lesion. Postcholecystectomy. No biliary dilatation. Pancreas: Pancreas is normal. No ductal dilatation. No pancreatic inflammation. Spleen: Normal spleen Adrenals/urinary tract: Thickening of LEFT adrenal gland. On delayed imaging gland has Hounsfield units less than 10 consistent benign adenoma. Kidneys enhance symmetrically. No obstruction. Ureters and bladder normal Stomach/Bowel: Stomach and duodenum are normal. Mesenteric inflammation associated loop of small bowel in the upper pelvis. Inflammation occurs over approximately 5 cm segment and associated with mild bowel wall thickening and edema. (Image 64, series 6 and image 61, series 2). This is site of prior of focal ischemia of the small bowel. The bowel wall thickening is improved compared to prior. Inflammation similar. No additional small bowel lesions are present. Terminal ileum is normal. Appendix not identified. Appendix not identified. The: And rectosigmoid colon are normal. Vascular/Lymphatic: Stent within the superior mesenteric artery which fills distally to the stent. Celiac trunk patent. IMA patent minimal atherosclerotic calcification. Reproductive: Post hysterectomy Other: No free fluid. Musculoskeletal: No aggressive osseous lesion. IMPRESSION: 1. Persistent but improved segment of mid small bowel inflammation / edema presumably related to partial SMA occlusion seen on CT 06/19/2017. Interval placement of a stent in the SMA with good filling distally. 2. No additional acute findings the abdomen pelvis. 3. No additional sites of thromboembolism or ischemia. Electronically Signed   By: Suzy Bouchard M.D.   On: 07/30/2017 16:09   Acute Abdominal Series  Result Date: 07/29/2017 CLINICAL DATA:  Abdominal pain. EXAM: DG ABDOMEN ACUTE W/ 1V CHEST COMPARISON:  CT abdomen pelvis and chest x-ray dated June 19, 2017. FINDINGS: There are a few loops of  borderline dilated small bowel in the left upper quadrant. No air-fluid levels. Air and stool are noted throughout the colon. No pneumoperitoneum. Prior cholecystectomy. Proximal SMA stent. Surgical clips in the right pelvis. No acute osseous abnormality. Heart size and mediastinal contours are within normal limits. Both lungs are clear. IMPRESSION: 1. Few loops of borderline dilated small bowel in the left upper quadrant, nonspecific, and could reflect enteritis. No definite evidence of obstruction. 2.  No active cardiopulmonary disease. Electronically Signed   By: Titus Dubin M.D.   On: 07/29/2017 16:54    EKG:   Orders placed or performed during the hospital encounter of 07/29/17  . EKG 12-Lead  . EKG 12-Lead  . ED EKG  . ED EKG  . EKG 12-Lead  . EKG 12-Lead      Management plans discussed with the patient, family and they are in agreement.  CODE STATUS:     Code Status Orders        Start     Ordered   07/29/17 1600  Full code  Continuous     07/29/17 1559    Code Status History    Date Active Date Inactive Code Status Order ID Comments User Context   06/19/2017  8:19 PM 06/28/2017  4:38 PM Full Code [id number]  Vickie Epley, MD ED      TOTAL TIME TAKING CARE OF THIS PATIENT: 45  minutes.   Note: This dictation was prepared with Dragon dictation along with smaller phrase technology. Any transcriptional errors that result from this process are unintentional.   @[facility] @  on 08/02/2017 at 2:41 PM  Between 7am to 6pm - Pager - [phone]  After 6pm go to [email or web address] - password [email or web address] [facility]  Office  [phone]  CC: Primary care physician; Carla [name]

## 2017-08-02 NOTE — Discharge Instructions (Signed)
° °  Abdominal Pain, Adult Abdominal pain can be caused by many things. Often, abdominal pain is not serious and it gets better with no treatment or by being treated at home. However, sometimes abdominal pain is serious. Your health care provider will do a medical history and a physical exam to try to determine the cause of your abdominal pain. Follow these instructions at home:  Take over-the-counter and prescription medicines only as told by your health care provider. Do not take a laxative unless told by your health care provider.  Drink enough fluid to keep your urine clear or pale yellow.  Watch your condition for any changes.  Keep all follow-up visits as told by your health care provider. This is important. Contact a health care provider if:  Your abdominal pain changes or gets worse.  You are not hungry or you lose weight without trying.  You are constipated or have diarrhea for more than 2-3 days.  You have pain when you urinate or have a bowel movement.  Your abdominal pain wakes you up at night.  Your pain gets worse with meals, after eating, or with certain foods.  You are throwing up and cannot keep anything down.  You have a fever. Get help right away if:  Your pain does not go away as soon as your health care provider told you to expect.  You cannot stop throwing up.  Your pain is only in areas of the abdomen, such as the right side or the left lower portion of the abdomen.  You have bloody or black stools, or stools that look like tar.  You have severe pain, cramping, or bloating in your abdomen.  You have signs of dehydration, such as: ? Dark urine, very little urine, or no urine. ? Cracked lips. ? Dry mouth. ? Sunken eyes. ? Sleepiness. ? Weakness. This information is not intended to replace advice given to you by your health care provider. Make sure you discuss any questions you have with your health care provider. Document Released: 07/19/2005  Document Revised: 04/28/2016 Document Reviewed: 03/22/2016 Elsevier Interactive Patient Education  2017 [place] with primary care physician in a week; refer to  general surgery if needed

## 2017-08-03 LAB — CULTURE, BLOOD (ROUTINE X 2)
Culture: NO GROWTH
Culture: NO GROWTH
Special Requests: ADEQUATE

## 2017-08-06 ENCOUNTER — Inpatient Hospital Stay: Payer: Medicaid Other

## 2017-08-06 ENCOUNTER — Encounter: Payer: Self-pay | Admitting: Emergency Medicine

## 2017-08-06 ENCOUNTER — Inpatient Hospital Stay
Admission: EM | Admit: 2017-08-06 | Discharge: 2017-08-17 | DRG: 329 | Disposition: A | Discharge status: Home / Self Care | Payer: Medicaid Other | Attending: Surgery | Admitting: Surgery

## 2017-08-06 ENCOUNTER — Emergency Department: Payer: Medicaid Other

## 2017-08-06 DIAGNOSIS — K56609 Unspecified intestinal obstruction, unspecified as to partial versus complete obstruction: Secondary | ICD-10-CM | POA: Diagnosis present

## 2017-08-06 DIAGNOSIS — E785 Hyperlipidemia, unspecified: Secondary | ICD-10-CM | POA: Diagnosis present

## 2017-08-06 DIAGNOSIS — F4322 Adjustment disorder with anxiety: Secondary | ICD-10-CM | POA: Diagnosis present

## 2017-08-06 DIAGNOSIS — K55019 Acute (reversible) ischemia of small intestine, extent unspecified: Secondary | ICD-10-CM | POA: Diagnosis present

## 2017-08-06 DIAGNOSIS — K219 Gastro-esophageal reflux disease without esophagitis: Secondary | ICD-10-CM | POA: Diagnosis present

## 2017-08-06 DIAGNOSIS — I2510 Atherosclerotic heart disease of native coronary artery without angina pectoris: Secondary | ICD-10-CM | POA: Diagnosis present

## 2017-08-06 DIAGNOSIS — R0789 Other chest pain: Secondary | ICD-10-CM | POA: Diagnosis not present

## 2017-08-06 DIAGNOSIS — K551 Chronic vascular disorders of intestine: Secondary | ICD-10-CM | POA: Diagnosis present

## 2017-08-06 DIAGNOSIS — G8929 Other chronic pain: Secondary | ICD-10-CM | POA: Diagnosis present

## 2017-08-06 DIAGNOSIS — K5909 Other constipation: Secondary | ICD-10-CM | POA: Diagnosis present

## 2017-08-06 DIAGNOSIS — F17210 Nicotine dependence, cigarettes, uncomplicated: Secondary | ICD-10-CM | POA: Diagnosis present

## 2017-08-06 DIAGNOSIS — I110 Hypertensive heart disease with heart failure: Secondary | ICD-10-CM | POA: Diagnosis present

## 2017-08-06 DIAGNOSIS — E43 Unspecified severe protein-calorie malnutrition: Secondary | ICD-10-CM | POA: Diagnosis present

## 2017-08-06 DIAGNOSIS — Z6841 Body Mass Index (BMI) 40.0 and over, adult: Secondary | ICD-10-CM | POA: Diagnosis not present

## 2017-08-06 DIAGNOSIS — K567 Ileus, unspecified: Secondary | ICD-10-CM

## 2017-08-06 DIAGNOSIS — R109 Unspecified abdominal pain: Secondary | ICD-10-CM

## 2017-08-06 DIAGNOSIS — K660 Peritoneal adhesions (postprocedural) (postinfection): Secondary | ICD-10-CM | POA: Diagnosis present

## 2017-08-06 DIAGNOSIS — I5032 Chronic diastolic (congestive) heart failure: Secondary | ICD-10-CM | POA: Diagnosis present

## 2017-08-06 DIAGNOSIS — K9189 Other postprocedural complications and disorders of digestive system: Secondary | ICD-10-CM

## 2017-08-06 DIAGNOSIS — R079 Chest pain, unspecified: Secondary | ICD-10-CM

## 2017-08-06 HISTORY — DX: Unspecified intestinal obstruction, unspecified as to partial versus complete obstruction: K56.609

## 2017-08-06 LAB — COMPREHENSIVE METABOLIC PANEL
ALT: 10 U/L — ABNORMAL LOW (ref 14–54)
AST: 17 U/L (ref 15–41)
Albumin: 3.2 g/dL — ABNORMAL LOW (ref 3.5–5.0)
Alkaline Phosphatase: 99 U/L (ref 38–126)
Anion gap: 11 (ref 5–15)
BUN: 12 mg/dL (ref 6–20)
CO2: 25 mmol/L (ref 22–32)
Calcium: 9.4 mg/dL (ref 8.9–10.3)
Chloride: 103 mmol/L (ref 101–111)
Creatinine, Ser: 1.05 mg/dL — ABNORMAL HIGH (ref 0.44–1.00)
GFR calc Af Amer: >60 mL/min (ref >60)
GFR calc non Af Amer: 58 mL/min — ABNORMAL LOW (ref >60)
Glucose, Bld: 118 mg/dL — ABNORMAL HIGH (ref 65–99)
Potassium: 4 mmol/L (ref 3.5–5.1)
Sodium: 139 mmol/L (ref 135–145)
Total Bilirubin: 0.5 mg/dL (ref 0.3–1.2)
Total Protein: 6.8 g/dL (ref 6.5–8.1)

## 2017-08-06 LAB — CBC
HCT: 35 % (ref 35.0–47.0)
Hemoglobin: 11.9 g/dL — ABNORMAL LOW (ref 12.0–16.0)
MCH: 29.8 pg (ref 26.0–34.0)
MCHC: 33.9 g/dL (ref 32.0–36.0)
MCV: 87.9 fL (ref 80.0–100.0)
Platelets: 269 10*3/uL (ref 150–440)
RBC: 3.98 MIL/uL (ref 3.80–5.20)
RDW: 14.8 % — ABNORMAL HIGH (ref 11.5–14.5)
WBC: 6.7 10*3/uL (ref 3.6–11.0)

## 2017-08-06 LAB — URINALYSIS, COMPLETE (UACMP) WITH MICROSCOPIC
Bacteria, UA: NONE SEEN
Bilirubin Urine: NEGATIVE
Glucose, UA: NEGATIVE mg/dL
Hgb urine dipstick: NEGATIVE
Ketones, ur: NEGATIVE mg/dL
Leukocytes, UA: NEGATIVE
Nitrite: NEGATIVE
Protein, ur: NEGATIVE mg/dL
Specific Gravity, Urine: >1.046 — ABNORMAL HIGH (ref 1.005–1.030)
pH: 5 (ref 5.0–8.0)

## 2017-08-06 LAB — LIPASE, BLOOD: Lipase: 25 U/L (ref 11–51)

## 2017-08-06 MED ORDER — KCL IN DEXTROSE-NACL 20-5-0.45 MEQ/L-%-% IV SOLN
INTRAVENOUS | Status: DC
  Administered 2017-08-06 – 2017-08-10 (×7): via INTRAVENOUS
  Filled 2017-08-06 (×14): qty 1000

## 2017-08-06 MED ORDER — ONDANSETRON HCL 4 MG/2ML IJ SOLN
4.0000 mg | Freq: Once | INTRAMUSCULAR | Status: AC
  Administered 2017-08-06: 4 mg via INTRAVENOUS
  Filled 2017-08-06: qty 2

## 2017-08-06 MED ORDER — GABAPENTIN 300 MG PO CAPS
300.0000 mg | ORAL_CAPSULE | Freq: Two times a day (BID) | ORAL | Status: DC
  Filled 2017-08-06 (×5): qty 1

## 2017-08-06 MED ORDER — METOPROLOL TARTRATE 50 MG PO TABS
50.0000 mg | ORAL_TABLET | Freq: Two times a day (BID) | ORAL | Status: DC
  Administered 2017-08-08 – 2017-08-17 (×15): 50 mg via ORAL
  Filled 2017-08-06 (×18): qty 1

## 2017-08-06 MED ORDER — ONDANSETRON HCL 4 MG PO TABS
4.0000 mg | ORAL_TABLET | Freq: Four times a day (QID) | ORAL | Status: DC | PRN
  Filled 2017-08-06 (×2): qty 1

## 2017-08-06 MED ORDER — HEPARIN SODIUM (PORCINE) 5000 UNIT/ML IJ SOLN
5000.0000 [IU] | Freq: Three times a day (TID) | INTRAMUSCULAR | Status: DC
  Administered 2017-08-07 – 2017-08-09 (×5): 5000 [IU] via SUBCUTANEOUS
  Filled 2017-08-06 (×4): qty 1

## 2017-08-06 MED ORDER — HYDROMORPHONE HCL 1 MG/ML IJ SOLN
0.5000 mg | Freq: Once | INTRAMUSCULAR | Status: AC
  Administered 2017-08-06: 0.5 mg via INTRAVENOUS
  Filled 2017-08-06: qty 1

## 2017-08-06 MED ORDER — IOPAMIDOL (ISOVUE-370) INJECTION 76%
100.0000 mL | Freq: Once | INTRAVENOUS | Status: AC | PRN
  Administered 2017-08-06: 100 mL via INTRAVENOUS

## 2017-08-06 MED ORDER — HYDROMORPHONE HCL 1 MG/ML IJ SOLN
INTRAMUSCULAR | Status: AC
  Administered 2017-08-06: 0.5 mg via INTRAVENOUS
  Filled 2017-08-06: qty 1

## 2017-08-06 MED ORDER — ALPRAZOLAM 0.5 MG PO TABS
0.5000 mg | ORAL_TABLET | Freq: Two times a day (BID) | ORAL | Status: DC | PRN
  Administered 2017-08-11 (×2): 0.5 mg via ORAL
  Filled 2017-08-06 (×3): qty 1

## 2017-08-06 MED ORDER — NICOTINE 14 MG/24HR TD PT24
14.0000 mg | MEDICATED_PATCH | Freq: Every day | TRANSDERMAL | Status: DC
  Administered 2017-08-07 – 2017-08-08 (×2): 14 mg via TRANSDERMAL
  Filled 2017-08-06 (×7): qty 1

## 2017-08-06 MED ORDER — APIXABAN 5 MG PO TABS: 5.0000 mg | ORAL_TABLET | Freq: Two times a day (BID) | ORAL | Status: DC

## 2017-08-06 MED ORDER — LISINOPRIL 20 MG PO TABS
40.0000 mg | ORAL_TABLET | Freq: Every day | ORAL | Status: DC
  Administered 2017-08-08 – 2017-08-10 (×3): 40 mg via ORAL
  Filled 2017-08-06 (×4): qty 2

## 2017-08-06 MED ORDER — HYDROMORPHONE HCL 1 MG/ML IJ SOLN
0.5000 mg | Freq: Once | INTRAMUSCULAR | Status: AC
  Administered 2017-08-06: 0.5 mg via INTRAVENOUS

## 2017-08-06 MED ORDER — ONDANSETRON HCL 4 MG/2ML IJ SOLN
4.0000 mg | Freq: Four times a day (QID) | INTRAMUSCULAR | Status: DC | PRN
  Administered 2017-08-06 – 2017-08-15 (×2): 4 mg via INTRAVENOUS
  Filled 2017-08-06 (×3): qty 2

## 2017-08-06 MED ORDER — MORPHINE SULFATE (PF) 2 MG/ML IV SOLN
2.0000 mg | INTRAVENOUS | Status: DC | PRN
  Administered 2017-08-06 – 2017-08-10 (×20): 2 mg via INTRAVENOUS
  Filled 2017-08-06 (×21): qty 1

## 2017-08-06 NOTE — ED Triage Notes (Signed)
Last BM 10/11.

## 2017-08-06 NOTE — ED Provider Notes (Signed)
[facility] Emergency Department Provider Note   ____________________________________________   First MD Initiated Contact with Patient 08/06/17 1730     (approximate)  I have reviewed the triage vital signs and the nursing notes.   HISTORY  Chief Complaint Abdominal Pain    HPI Carla [name] is a 56 y.o. female Patient has a known history of bowel ischemia. She reports she's been having pain since she got out of the hospital or actually 2 days after she got out of the hospital last week. Pain is getting worse. Her doctor told her to come back if the pain got worse so she did. Pain comes on as soon as she starts to eat something and then last for a while afterwards. Patient gets some nausea with it. Patient has no other complaints except for this recurrent abdominal pain that she gets because of ischemia. The pain is moderate to severe achy   Past Medical History:  Diagnosis Date  . Abdominal aortic atherosclerosis ([name])    a. 05/2017 CTA abd/pelvis: significant atherosclerotic dzs of the infrarenal abd Ao w/ some mural thrombus. No aneurysm or dissection.  . Baker's cyst of knee, right    a. 07/2016 U/S: 4.1 x 1.4 x 2.9 cystic structure in R poplitetal fossa.  . Bell's palsy   . Cancer ([place])   . Chest pain    a. 08/2012 Lexiscan MV: EF 54%, non ischemia/infarct.  . Diastolic dysfunction    a. 05/2017 Echo: Ef 60-65%, no rwma, Gr1 DD, no source of cardiac emboli.  . 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 05/2017 CTA Abd/pelvis: apparent thrombus or embolus in prox SMA (70-90%); b. 05/2017 catheter directed tPA, mechanical thrombectomy, and stenting of the SMA.  [name] GERD (gastroesophageal reflux disease)   . Hyperlipidemia   . Hypertension   . Morbid obesity with BMI of 40.0-44.9, adult [facility])     Patient Active Problem List   Diagnosis Date Noted  . AKI (acute kidney injury) ([place]) 07/29/2017  . Hypotension 07/29/2017  . Embolus of superior  mesenteric artery ([place])   . Abdominal pain   . 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 Acute focal ischemia of small intestine (HCC)   . Acute right-sided low back pain with right-sided sciatica 06/13/2017  . Primary osteoarthritis of both knees 06/13/2017  . Pain syndrome, chronic 09/13/2016  . Morbid obesity with BMI of 40.0-44.9, adult ([place]) 07/16/2016  . Personal history of other malignant neoplasm of skin 06/01/2016  . Chronic pain of both knees 01/19/2016  . Essential hypertension with goal blood pressure less than 140/90 01/19/2016  . Gastroesophageal reflux disease 01/19/2016  . Hyperlipidemia 01/19/2016  . Prediabetes 01/19/2016  . Recurrent major depressive disorder ([place]) 01/19/2016  . Urinary tract infection 01/09/2014  . Chronic pelvic pain in female 11/14/2013  . Mixed stress and urge urinary incontinence 11/14/2013  . Chest pain 08/06/2012    Past Surgical History:  Procedure Laterality Date  . CHOLECYSTECTOMY    . TEE WITHOUT CARDIOVERSION N/A 06/22/2017   Procedure: TRANSESOPHAGEAL ECHOCARDIOGRAM (TEE);  Surgeon: Wellington Hampshire, MD;  Location: [facility] ORS;  Service: Cardiovascular;  Laterality: N/A;  . VAGINAL HYSTERECTOMY    . VISCERAL ARTERY INTERVENTION N/A 06/20/2017   Procedure: Visceral Artery Intervention, possible aortic thrombectomy;  Surgeon: Algernon Huxley, MD;  Location: [facility];  Service: Cardiovascular;  Laterality: N/A;    Prior to Admission medications  Medication Sig Start Date End Date Taking? Authorizing Provider  acetaminophen (TYLENOL) 325 MG tablet Take 2 tablets (650 mg total) by mouth every 6 (six) hours as needed for mild pain (or Fever >/= 101). 08/02/17   Gouru, Illene Silver, MD  ALPRAZolam Duanne Moron) 0.5 MG tablet Take 1 tablet (0.5 mg total) by mouth 2 (two) times daily as needed for anxiety. Patient not taking: Reported on 07/29/2017 06/28/17   Olean Ree, MD  apixaban  (ELIQUIS) 5 MG TABS tablet Take 1 tablet (5 mg total) by mouth 2 (two) times daily. 06/28/17   Olean Ree, MD  atorvastatin (LIPITOR) 80 MG tablet Take 80 mg by mouth at bedtime.    [provider]  cephALEXin (KEFLEX) 500 MG capsule Take 1 capsule (500 mg total) by mouth every 12 (twelve) hours. 08/02/17   Gouru, Illene Silver, MD  esomeprazole (NEXIUM) 40 MG capsule Take 40 mg by mouth 2 (two) times daily before a meal.     [provider]  gabapentin (NEURONTIN) 300 MG capsule Take 300 mg by mouth 2 (two) times daily.    [provider]  nicotine (NICODERM CQ - DOSED IN MG/24 HOURS) 14 mg/24hr patch Place 1 patch (14 mg total) onto the skin daily. 08/03/17   Gouru, Illene Silver, MD  oxybutynin (DITROPAN) 5 MG tablet Take 1 tablet by mouth 3 (three) times daily as needed. 06/13/17   [provider]  polyethylene glycol (MIRALAX / GLYCOLAX) packet Take 17 g by mouth daily. 08/02/17   Gouru, Illene Silver, MD  traMADol (ULTRAM) 50 MG tablet Take 50 mg by mouth every 6 (six) hours as needed for moderate pain.     [provider]  Vitamin D, Ergocalciferol, (DRISDOL) 50000 units CAPS capsule Take 1 capsule by mouth once a week. 06/13/17   [provider]    Allergies Patient has no known allergies.  Family History  Problem Relation Age of Onset  . Hypertension Mother   . Heart disease Mother   . Heart attack Mother   . Breast cancer Mother   . Hypertension Father   . Breast cancer Paternal Aunt     Social History Social History  Substance Use Topics  . Smoking status: Current Some Day Smoker    Packs/day: 0.50    Types: Cigarettes  . Smokeless tobacco: Never Used  . Alcohol use No    Review of Systems  Constitutional: No fever/chills Eyes: No visual changes. ENT: No sore throat. Cardiovascular: Denies chest pain. Respiratory: Denies shortness of breath. Gastrointestinal: see history of present illness Genitourinary: Negative for  dysuria. Musculoskeletal: patient reports pain radiates to her back Skin: Negative for rash. Neurological: Negative for headaches, focal weakness  {  ____________________________________________   PHYSICAL EXAM:  VITAL SIGNS: ED Triage Vitals  Enc Vitals Group     BP 08/06/17 1555 115/74     Pulse Rate 08/06/17 1555 73     Resp 08/06/17 1555 18     Temp 08/06/17 1555 98.7 F (37.1 C)     Temp Source 08/06/17 1555 Oral     SpO2 08/06/17 1555 93 %     Weight 08/06/17 1553 231 lb (104.8 kg)     Height 08/06/17 1553 5\' 4"  (1.626 m)     Head Circumference --      Peak Flow --      Pain Score 08/06/17 1552 8     Pain Loc --      Pain Edu? --      Excl.  in [place]? --     Constitutional: Alert and oriented. Well appearing and in no acute distress. Eyes: Conjunctivae are normal. PERRL. EOMI. Head: Atraumatic. Nose: No congestion/rhinnorhea. Mouth/Throat: Mucous membranes are moist.  Oropharynx non-erythematous. Neck: No stridor.   Cardiovascular: Normal rate, regular rhythm. Grossly normal heart sounds.  Good peripheral circulation. Respiratory: Normal respiratory effort.  No retractions. Lungs CTAB. Gastrointestinal: Soft  No distention. No abdominal bruits. No CVA tenderness. Musculoskeletal: No lower extremity tenderness nor edema.  No joint effusions. Neurologic:  Normal speech and language. No gross focal neurologic deficits are appreciated. No gait instability. Skin:  Skin is warm, dry and intact. No rash noted. Psychiatric: Mood and affect are normal. Speech and behavior are normal.  ____________________________________________   LABS (all labs ordered are listed, but only abnormal results are displayed)  Labs Reviewed  COMPREHENSIVE METABOLIC PANEL - Abnormal; Notable for the following:       Result Value   Glucose, Bld 118 (*)    Creatinine, Ser 1.05 (*)    Albumin 3.2 (*)    ALT 10 (*)    GFR calc non Af Amer 58 (*)    All other components within normal limits   CBC - Abnormal; Notable for the following:    Hemoglobin 11.9 (*)    RDW 14.8 (*)    All other components within normal limits  LIPASE, BLOOD  URINALYSIS, COMPLETE (UACMP) WITH MICROSCOPIC   ____________________________________________  EKG   ____________________________________________  RADIOLOGY   ____________________________________________   PROCEDURES  Procedure(s) performed:  Procedures  Critical Care performed:   ____________________________________________   INITIAL IMPRESSION / ASSESSMENT AND PLAN / ED COURSE   and had thrombectomy done by IR several weeks previously. Patient at the time of her last visit was also constipated and had a UTI general surgery at that time thought there was nothing that could be gained from further surgery.patient was on L course at the time of her last hospitalization   discussed in detail with Dr. Burt Knack who reviews the CT scan. He feels this is not a complete bowel obstruction he will put the patient in and complete evaluation of her. Patient has at least a partial small bowel obstruction diagnosed on CT scan and apparently more bowel ischemia with the fact that the SMA is hazy past and of the stent.  ____________________________________________   FINAL CLINICAL IMPRESSION(S) / ED DIAGNOSES  Final diagnoses:  Abdominal pain, unspecified abdominal location  SBO (small bowel obstruction) ([place])  Chronic mesenteric ischemia (HCC)      NEW MEDICATIONS STARTED DURING THIS VISIT:  New Prescriptions   No medications on file     Note:  This document was prepared using Dragon voice recognition software and may include unintentional dictation errors.    Nena Polio, MD 08/06/17 [phone]

## 2017-08-06 NOTE — ED Notes (Signed)
This RN put the syringe for the Zofran in the sharps container and also accidentally put the remainder of the Dilaudid in there too.  This RN was unable to waste the 0.5 of remaining Dilaudid.  Registration was in the room at this time.

## 2017-08-06 NOTE — ED Notes (Signed)
Kasey RN, aware of bed assigned  

## 2017-08-06 NOTE — ED Triage Notes (Signed)
Just discharged from hospital last Thursday.  Arrives today with c/o abdominal pain.  Patient has recently had a blockage to intestines, kidney problems.

## 2017-08-06 NOTE — ED Notes (Signed)
Patient transported to CT 

## 2017-08-06 NOTE — H&P (Signed)
Carla [name] is an 56 y.o. female.    Chief Complaint: abdominal pain  HPI:his patient well-known to me into the surgical service. She had an original episode of acute on chronic mesenteric I dew, whereher SMA was stented.That stent is patent. The patient s in the hospital last week with abdominal pain and discharge. STHEN SHE"S HAD ONGOING ABDOMINAL PAIAND STATES SHE"NOT PASSING GAS OR HAD A BOWEL MOVEMENT SINCE LAST WEEK> She denies fevers or chills. A CT scan in the emergency room suggest a bowel obstruction with a transition point in an area where prior ischemia was identified.  Past Medical History:  Diagnosis Date  . Abdominal aortic atherosclerosis ([place])    a. 05/2017 CTA abd/pelvis: significant atherosclerotic dzs of the infrarenal abd Ao w/ some mural thrombus. No aneurysm or dissection.  . Baker's cyst of knee, right    a. 07/2016 U/S: 4.1 x 1.4 x 2.9 cystic structure in R poplitetal fossa.  . Bell's palsy   . Cancer ([place])   . Chest pain    a. 08/2012 Lexiscan MV: EF 54%, non ischemia/infarct.  . Diastolic dysfunction    a. 05/2017 Echo: Ef 60-65%, no rwma, Gr1 DD, no source of cardiac emboli.  . 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 05/2017 CTA Abd/pelvis: apparent thrombus or embolus in prox SMA (70-90%); b. 05/2017 catheter directed tPA, mechanical thrombectomy, and stenting of the SMA.  [name] GERD (gastroesophageal reflux disease)   . Hyperlipidemia   . Hypertension   . Morbid obesity with BMI of 40.0-44.9, adult [facility])     Past Surgical History:  Procedure Laterality Date  . CHOLECYSTECTOMY    . TEE WITHOUT CARDIOVERSION N/A 06/22/2017   Procedure: TRANSESOPHAGEAL ECHOCARDIOGRAM (TEE);  Surgeon: Wellington Hampshire, MD;  Location: [facility] ORS;  Service: Cardiovascular;  Laterality: N/A;  . VAGINAL HYSTERECTOMY    . VISCERAL ARTERY INTERVENTION N/A 06/20/2017   Procedure: Visceral Artery Intervention, possible aortic thrombectomy;  Surgeon: Algernon Huxley, MD;  Location:  [facility];  Service: Cardiovascular;  Laterality: N/A;    Family History  Problem Relation Age of Onset  . Hypertension Mother   . Heart disease Mother   . Heart attack Mother   . Breast cancer Mother   . Hypertension Father   . Breast cancer Paternal Aunt    Social History:  reports that she has been smoking Cigarettes.  She has been smoking about 0.50 packs per day. She has never used smokeless tobacco. She reports that she does not drink alcohol or use drugs.  Allergies: No Known Allergies   (Not in a hospital admission)   Review of Systems  Constitutional: Negative for chills and fever.  HENT: Negative.   Eyes: Negative.   Respiratory: Negative.   Cardiovascular: Negative.   Gastrointestinal: Positive for abdominal pain, constipation and nausea. Negative for blood in stool, diarrhea, heartburn, melena and vomiting.  Genitourinary: Negative.   Musculoskeletal: Negative.   Skin: Negative.   Neurological: Negative.   Endo/Heme/Allergies: Negative.   Psychiatric/Behavioral: Negative.      Physical Exam:  BP 121/77   Pulse 61   Temp 98.7 F (37.1 C) (Oral)   Resp 14   Ht _0  (1.626 m)   Wt 231 lb (104.8 kg)   SpO2 95%   BMI 39.65 kg/m   Physical Exam  Constitutional: She is oriented to person, place, and time and well-developed, well-nourished, and in no distress. No distress.  HENT:  Head: Normocephalic and  atraumatic.  Eyes: Pupils are equal, round, and reactive to light. Right eye exhibits no discharge. Left eye exhibits no discharge. No scleral icterus.  Neck: Normal range of motion.  Cardiovascular: Normal rate, regular rhythm and normal heart sounds.   Pulmonary/Chest: Effort normal and breath sounds normal. No respiratory distress. She has no wheezes. She has no rales.  Abdominal: Soft. She exhibits no distension. There is tenderness. There is no rebound and no guarding.  Minimal tenderness no guarding no rebound no percussion tenderness  slightly distended. But nontympanitic. This exam is not much different than what it was last week when I saw the patient in the hospital.  Musculoskeletal: Normal range of motion. She exhibits edema.  Lymphadenopathy:    She has no cervical adenopathy.  Neurological: She is alert and oriented to person, place, and time.  Skin: Skin is warm and dry. She is not diaphoretic. No erythema.  Psychiatric: Mood and affect normal.  Vitals reviewed.       Results for orders placed or performed during the hospital encounter of 08/06/17 (from the past 48 hour(s))  Lipase, blood     Status: None   Collection Time: 08/06/17  3:54 PM  Result Value Ref Range   Lipase 25 11 - 51 U/L  Comprehensive metabolic panel     Status: Abnormal   Collection Time: 08/06/17  3:54 PM  Result Value Ref Range   Sodium 139 135 - 145 mmol/L   Potassium 4.0 3.5 - 5.1 mmol/L   Chloride 103 101 - 111 mmol/L   CO2 25 22 - 32 mmol/L   Glucose, Bld 118 (H) 65 - 99 mg/dL   BUN 12 6 - 20 mg/dL   Creatinine, Ser 1.05 (H) 0.44 - 1.00 mg/dL   Calcium 9.4 8.9 - 10.3 mg/dL   Total Protein 6.8 6.5 - 8.1 g/dL   Albumin 3.2 (L) 3.5 - 5.0 g/dL   AST 17 15 - 41 U/L   ALT 10 (L) 14 - 54 U/L   Alkaline Phosphatase 99 38 - 126 U/L   Total Bilirubin 0.5 0.3 - 1.2 mg/dL   GFR calc non Af Amer 58 (L) >60 mL/min   GFR calc Af Amer >60 >60 mL/min    Comment: (NOTE) The eGFR has been calculated using the CKD EPI equation. This calculation has not been validated in all clinical situations. eGFR's persistently <60 mL/min signify possible Chronic Kidney Disease.    Anion gap 11 5 - 15  CBC     Status: Abnormal   Collection Time: 08/06/17  3:54 PM  Result Value Ref Range   WBC 6.7 3.6 - 11.0 K/uL   RBC 3.98 3.80 - 5.20 MIL/uL   Hemoglobin 11.9 (L) 12.0 - 16.0 g/dL   HCT 35.0 35.0 - 47.0 %   MCV 87.9 80.0 - 100.0 fL   MCH 29.8 26.0 - 34.0 pg   MCHC 33.9 32.0 - 36.0 g/dL   RDW 14.8 (H) 11.5 - 14.5 %   Platelets 269 150 - 440  K/uL   Ct Angio Abd/pel W/ And/or W/o  Result Date: 08/06/2017 CLINICAL DATA:  Continued intermittent lower abdominal pain. History of SMA thrombectomy and stent placement. EXAM: CTA ABDOMEN AND PELVIS wITHOUT AND WITH CONTRAST TECHNIQUE: Multidetector CT imaging of the abdomen and pelvis was performed using the standard protocol during bolus administration of intravenous contrast. Multiplanar reconstructed images and MIPs were obtained and reviewed to evaluate the vascular anatomy. CONTRAST:  100 cc Isovue 370 COMPARISON:  07/30/2017 FINDINGS: VASCULAR Aorta: Patent with atherosclerotic calcification and mural thrombus most prominent just proximal to the bifurcation. Celiac: Widely patent. SMA: Proximal SMA stent extends about 10 mm into the lumen of the aorta. Lumen of the stent opacifies and there is opacification of the SMA distal to the proximal stent. Renals: Widely patent. Tiny accessory left renal artery evident arising caudal to the main left renal artery and supplying the lower pole. Single right renal artery appears widely patent. IMA: Patent. Inflow: Patent. Proximal Outflow: Patent. Veins: Non-opacified on this early arterial phase study. Review of the MIP images confirms the above findings. NON-VASCULAR Lower chest: Calcified granuloma identified posterior right lower lobe. Hepatobiliary: No focal abnormality within the liver parenchyma. Gallbladder surgically absent. Common duct measures 13 mm, similar to prior. Pancreas: No focal mass lesion. No dilatation of the main duct. No intraparenchymal cyst. No peripancreatic edema. Spleen: No splenomegaly. No focal mass lesion. Adrenals/Urinary Tract: Right adrenal gland unremarkable. 15 mm left adrenal adenoma had an average attenuation of Hounsfield units on prior imaging suggesting adenoma. Kidneys unremarkable. No evidence for hydroureter. The urinary bladder appears normal for the degree of distention. Stomach/Bowel: Stomach is nondistended. No  gastric wall thickening. No evidence of outlet obstruction. Duodenum is normally positioned as is the ligament of Treitz. Jejunal loops are fluid-filled and distended measuring up to 3 cm diameter. Ileal loops are also fluid-filled and distended measuring up to 3.5 cm diameter. Interloop mesenteric fluid is identified in the pelvis. Transition zone in the ileum is identified on image 157 of series 4. This is an essentially the same location as the ischemic bowel loop seen on the study from 06/19/2017. On today's study, this represents about a 4-5 cm narrowed segment of small bowel with transmural circumferential hyperenhancement, a characteristic appearance for post ischemic stricture. Small bowel distal to this location is decompressed. Terminal ileum is decompressed. The appendix is not visualized, but there is no edema or inflammation in the region of the cecum. No gross colonic mass. No colonic wall thickening. No substantial diverticular change. Lymphatic: Scattered small mesenteric and retroperitoneal lymph nodes are evident. Insert no pelvic sidewall lymphadenopathy No pelvic sidewall lymphadenopathy. Reproductive: Uterus surgically absent.  There is no adnexal mass. Other: Small volume intraperitoneal free fluid evident. Musculoskeletal: Bone windows reveal no worrisome lytic or sclerotic osseous lesions. IMPRESSION: VASCULAR SMA stent appears patent. 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 Electronically Signed   By: Misty Stanley M.D.   On: 08/06/2017 18:47     Assessment/Plan  cT scan is py reviewed. Transition point is noted in an area where prior ischemia was present. Patient does not appear to be acutely ill however and  Pain complaints as well as her physical exam or appears unchanged as it was from last week. I plan would be to admit  the patient to the hospital. I will place a nasogastric tube because of the distention of the small bowel. Rehydrate the patient and then perform a small bowel series when appropriate to assess if there is truly a fixed stricture in  Florene Glen, MD, FACS

## 2017-08-07 ENCOUNTER — Inpatient Hospital Stay: Payer: Medicaid Other

## 2017-08-07 LAB — BASIC METABOLIC PANEL
Anion gap: 6 (ref 5–15)
BUN: 14 mg/dL (ref 6–20)
CO2: 27 mmol/L (ref 22–32)
Calcium: 8.6 mg/dL — ABNORMAL LOW (ref 8.9–10.3)
Chloride: 105 mmol/L (ref 101–111)
Creatinine, Ser: 0.93 mg/dL (ref 0.44–1.00)
GFR calc Af Amer: >60 mL/min (ref >60)
GFR calc non Af Amer: >60 mL/min (ref >60)
Glucose, Bld: 132 mg/dL — ABNORMAL HIGH (ref 65–99)
Potassium: 3.8 mmol/L (ref 3.5–5.1)
Sodium: 138 mmol/L (ref 135–145)

## 2017-08-07 LAB — CBC
HCT: 33.3 % — ABNORMAL LOW (ref 35.0–47.0)
Hemoglobin: 11.1 g/dL — ABNORMAL LOW (ref 12.0–16.0)
MCH: 29.7 pg (ref 26.0–34.0)
MCHC: 33.4 g/dL (ref 32.0–36.0)
MCV: 89 fL (ref 80.0–100.0)
Platelets: 218 10*3/uL (ref 150–440)
RBC: 3.74 MIL/uL — ABNORMAL LOW (ref 3.80–5.20)
RDW: 14.8 % — ABNORMAL HIGH (ref 11.5–14.5)
WBC: 7.8 10*3/uL (ref 3.6–11.0)

## 2017-08-07 MED ORDER — IOPAMIDOL (ISOVUE-300) INJECTION 61%
120.0000 mL | Freq: Once | INTRAVENOUS | Status: AC | PRN
  Administered 2017-08-07: 120 mL

## 2017-08-07 NOTE — Progress Notes (Signed)
CC: SBO Subjective: Main complaint today is nose and throat pain from the NG tube. Denies any gas. Some abdominal pain. CT scan from yesterday and KUB from today personally reviewed. There is mild dilation of the small bowel loops , no free air. Her white count, creatinine and bicarbonate are normal  Objective: Vital signs in last 24 hours: Temp:  [98.7 F (37.1 C)] 98.7 F (37.1 C) (10/16 0438) Pulse Rate:  [53-84] 84 (10/16 0438) Resp:  [14-22] 16 (10/16 0438) BP: (101-147)/(68-81) 120/68 (10/16 0438) SpO2:  [90 %-98 %] 96 % (10/16 0438) Weight:  [99.9 kg (220 lb 4.8 oz)-104.8 kg (231 lb)] 99.9 kg (220 lb 4.8 oz) (10/15 2301)    Intake/Output from previous day: 10/15 0701 - 10/16 0700 In: 375 [I.V.:375] Out: 250 [Emesis/NG output:250] Intake/Output this shift: No intake/output data recorded.  Physical exam: NAD Abd: soft, mild diffuse tenderness, no peritonitis Ext: well perfused and no edema   NAD, Lab Results: CBC   Recent Labs  08/06/17 1554 08/07/17 0411  WBC 6.7 7.8  HGB 11.9* 11.1*  HCT 35.0 33.3*  PLT 269 218   BMET  Recent Labs  08/06/17 1554 08/07/17 0411  NA 139 138  K 4.0 3.8  CL 103 105  CO2 25 27  GLUCOSE 118* 132*  BUN 12 14  CREATININE 1.05* 0.93  CALCIUM 9.4 8.6*   PT/INR No results for input(s): LABPROT, INR in the last 72 hours. ABG No results for input(s): PHART, HCO3 in the last 72 hours.  Invalid input(s): PCO2, PO2  Studies/Results: Dg Abd 2 Views  Result Date: 08/07/2017 CLINICAL DATA:  SBO, abdominal pain EXAM: ABDOMEN - 2 VIEW COMPARISON:  08/06/2017 FINDINGS: Enteric tube terminates in the proximal gastric body. Mildly prominent loops of small bowel in the left mid abdomen, correlating with suspected small bowel obstruction on CT. Colon is not decompressed. Cholecystectomy clips. Vascular stent in the upper mid abdomen. Excretory contrast in the bladder. IMPRESSION: Mildly prominent loops of small bowel in the left mid  abdomen, correlating with suspected small bowel obstruction on CT. Enteric tube terminates in the proximal gastric body Electronically Signed   By: Julian Hy M.D.   On: 08/07/2017 08:24   Dg Abd Portable 1 View  Result Date: 08/06/2017 CLINICAL DATA:  56 y/o  F; readjustment of enteric tube. EXAM: PORTABLE ABDOMEN - 1 VIEW COMPARISON:  08/06/2017 abdomen radiographs. FINDINGS: Enteric tube tip now projects over gastric body. Right upper quadrant cholecystectomy clips. Normal bowel gas pattern. Mild dextrocurvature of lumbar spine appear IMPRESSION: Enteric tube has been readjusted with tip projecting over gastric body. Electronically Signed   By: Kristine Garbe M.D.   On: 08/06/2017 22:52   Dg Abd Portable 1 View  Result Date: 08/06/2017 CLINICAL DATA:  NG tube placement EXAM: PORTABLE ABDOMEN - 1 VIEW COMPARISON:  Same day CT of the abdomen and pelvis FINDINGS: A gastric tube is seen coiled in the distal esophagus with the tip projecting back up the esophagus. The tip is excluded however on this current exam. Mild gaseous distention of small bowel loops with air-fluid level in the stomach noted. Cholecystectomy clips are seen in the right upper quadrant. IMPRESSION: Malpositioned NG tube is seen coiling back up the esophagus. The tip is excluded on current exam. Electronically Signed   By: Ashley Royalty M.D.   On: 08/06/2017 22:44   Ct Angio Abd/pel W/ And/or W/o  Result Date: 08/06/2017 CLINICAL DATA:  Continued intermittent lower abdominal pain. History of  SMA thrombectomy and stent placement. EXAM: CTA ABDOMEN AND PELVIS wITHOUT AND WITH CONTRAST TECHNIQUE: Multidetector CT imaging of the abdomen and pelvis was performed using the standard protocol during bolus administration of intravenous contrast. Multiplanar reconstructed images and MIPs were obtained and reviewed to evaluate the vascular anatomy. CONTRAST:  100 cc Isovue 370 COMPARISON:  07/30/2017 FINDINGS: VASCULAR Aorta:  Patent with atherosclerotic calcification and mural thrombus most prominent just proximal to the bifurcation. Celiac: Widely patent. SMA: Proximal SMA stent extends about 10 mm into the lumen of the aorta. Lumen of the stent opacifies and there is opacification of the SMA distal to the proximal stent. Renals: Widely patent. Tiny accessory left renal artery evident arising caudal to the main left renal artery and supplying the lower pole. Single right renal artery appears widely patent. IMA: Patent. Inflow: Patent. Proximal Outflow: Patent. Veins: Non-opacified on this early arterial phase study. Review of the MIP images confirms the above findings. NON-VASCULAR Lower chest: Calcified granuloma identified posterior right lower lobe. Hepatobiliary: No focal abnormality within the liver parenchyma. Gallbladder surgically absent. Common duct measures 13 mm, similar to prior. Pancreas: No focal mass lesion. No dilatation of the main duct. No intraparenchymal cyst. No peripancreatic edema. Spleen: No splenomegaly. No focal mass lesion. Adrenals/Urinary Tract: Right adrenal gland unremarkable. 15 mm left adrenal adenoma had an average attenuation of Hounsfield units on prior imaging suggesting adenoma. Kidneys unremarkable. No evidence for hydroureter. The urinary bladder appears normal for the degree of distention. Stomach/Bowel: Stomach is nondistended. No gastric wall thickening. No evidence of outlet obstruction. Duodenum is normally positioned as is the ligament of Treitz. Jejunal loops are fluid-filled and distended measuring up to 3 cm diameter. Ileal loops are also fluid-filled and distended measuring up to 3.5 cm diameter. Interloop mesenteric fluid is identified in the pelvis. Transition zone in the ileum is identified on image 157 of series 4. This is an essentially the same location as the ischemic bowel loop seen on the study from 06/19/2017. On today's study, this represents about a 4-5 cm narrowed segment of  small bowel with transmural circumferential hyperenhancement, a characteristic appearance for post ischemic stricture. Small bowel distal to this location is decompressed. Terminal ileum is decompressed. The appendix is not visualized, but there is no edema or inflammation in the region of the cecum. No gross colonic mass. No colonic wall thickening. No substantial diverticular change. Lymphatic: Scattered small mesenteric and retroperitoneal lymph nodes are evident. Insert no pelvic sidewall lymphadenopathy No pelvic sidewall lymphadenopathy. Reproductive: Uterus surgically absent.  There is no adnexal mass. Other: Small volume intraperitoneal free fluid evident. Musculoskeletal: Bone windows reveal no worrisome lytic or sclerotic osseous lesions. IMPRESSION: VASCULAR SMA stent appears patent. 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 Electronically Signed   By: Misty Stanley M.D.   On: 08/06/2017 18:47    Anti-infectives: Anti-infectives    None      Assessment/Plan: SBO ? Ischemic stricture I will order SBFT NO need for emergent surgical intervention I will hold off eloquis today  Caroleen Hamman, MD, FACS  08/07/2017

## 2017-08-07 NOTE — Progress Notes (Signed)
Initial Nutrition Assessment  DOCUMENTATION CODES:   Morbid obesity  INTERVENTION:   RD will monitor for diet advancement vs need for nutrition support   NUTRITION DIAGNOSIS:   Inadequate oral intake related to acute illness (SBO) as evidenced by NPO status.  GOAL:   Patient will meet greater than or equal to 90% of their needs  MONITOR:   Diet advancement, Labs, Weight trends, I & O's  REASON FOR ASSESSMENT:   Malnutrition Screening Tool    ASSESSMENT:   56 y.o. 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 CT scan in the emergency room suggest a bowel obstruction with a transition point in an area where prior ischemia was identified.   Unable to see pt today. Pt having SBFT today. Pt NPO with NGT in place. Per chart, pt with recent 10lb weight loss but RD is unsure of the time frame. RD will follow up with pt at a later date to obtain nutrition focused physical exam and nutrition history. RD will monitor for diet advancement vs need for nutrition support.   Medications reviewed and include: heparin, nicotine, NaCl w/ dextrose 5% and KCl, morphine   Labs reviewed: Ca 8.6(L) adj. 9.24 wnl, alb 3.2(L)  Unable to complete Nutrition-Focused physical exam at this time.   Diet Order:  Diet NPO time specified Except for: Sips with Meds  Skin:  Reviewed, no issues  Last BM:  10/11- type 7  Height:   Ht Readings from Last 1 Encounters:  08/06/17 5\' 2"  (1.575 m)    Weight:   Wt Readings from Last 1 Encounters:  08/06/17 220 lb 4.8 oz (99.9 kg)    Ideal Body Weight:  50 kg  BMI:   Body mass index is 40.29 kg/m.  Estimated Nutritional Needs:   Kcal:  1600-1900kcal/day   Protein:  100-110g/day   Fluid:  >1.6L/day   EDUCATION NEEDS:   Education needs no appropriate at this time  Koleen Distance MS, RD, [place] Pager #[phone] After Hours Pager: [phone]

## 2017-08-08 ENCOUNTER — Encounter
Admission: EM | Disposition: A | Discharge status: Home / Self Care | Payer: Self-pay | Source: Home / Self Care | Attending: Surgery

## 2017-08-08 ENCOUNTER — Inpatient Hospital Stay: Payer: Medicaid Other | Admitting: Certified Registered Nurse Anesthetist

## 2017-08-08 ENCOUNTER — Inpatient Hospital Stay: Payer: Medicaid Other

## 2017-08-08 ENCOUNTER — Encounter: Payer: Self-pay | Admitting: Anesthesiology

## 2017-08-08 DIAGNOSIS — E43 Unspecified severe protein-calorie malnutrition: Secondary | ICD-10-CM | POA: Insufficient documentation

## 2017-08-08 HISTORY — PX: LAPAROTOMY: SHX154

## 2017-08-08 LAB — CBC
HCT: 33.7 % — ABNORMAL LOW (ref 35.0–47.0)
Hemoglobin: 11.5 g/dL — ABNORMAL LOW (ref 12.0–16.0)
MCH: 29.9 pg (ref 26.0–34.0)
MCHC: 34.1 g/dL (ref 32.0–36.0)
MCV: 87.7 fL (ref 80.0–100.0)
Platelets: 209 10*3/uL (ref 150–440)
RBC: 3.84 MIL/uL (ref 3.80–5.20)
RDW: 14.8 % — ABNORMAL HIGH (ref 11.5–14.5)
WBC: 5.1 10*3/uL (ref 3.6–11.0)

## 2017-08-08 LAB — COMPREHENSIVE METABOLIC PANEL
ALT: 20 U/L (ref 14–54)
AST: 16 U/L (ref 15–41)
Albumin: 2.9 g/dL — ABNORMAL LOW (ref 3.5–5.0)
Alkaline Phosphatase: 112 U/L (ref 38–126)
Anion gap: 10 (ref 5–15)
BUN: 7 mg/dL (ref 6–20)
CO2: 26 mmol/L (ref 22–32)
Calcium: 8.4 mg/dL — ABNORMAL LOW (ref 8.9–10.3)
Chloride: 105 mmol/L (ref 101–111)
Creatinine, Ser: 0.95 mg/dL (ref 0.44–1.00)
GFR calc Af Amer: >60 mL/min (ref >60)
GFR calc non Af Amer: >60 mL/min (ref >60)
Glucose, Bld: 135 mg/dL — ABNORMAL HIGH (ref 65–99)
Potassium: 3.7 mmol/L (ref 3.5–5.1)
Sodium: 141 mmol/L (ref 135–145)
Total Bilirubin: 0.5 mg/dL (ref 0.3–1.2)
Total Protein: 6.1 g/dL — ABNORMAL LOW (ref 6.5–8.1)

## 2017-08-08 LAB — GLUCOSE, CAPILLARY: Glucose-Capillary: 114 mg/dL — ABNORMAL HIGH (ref 65–99)

## 2017-08-08 LAB — MAGNESIUM: Magnesium: 2.1 mg/dL (ref 1.7–2.4)

## 2017-08-08 LAB — TRIGLYCERIDES: Triglycerides: 159 mg/dL — ABNORMAL HIGH (ref <150)

## 2017-08-08 SURGERY — LAPAROTOMY, EXPLORATORY
Anesthesia: General | Site: Abdomen | Wound class: Contaminated

## 2017-08-08 MED ORDER — FENTANYL CITRATE (PF) 100 MCG/2ML IJ SOLN
INTRAMUSCULAR | Status: AC
  Filled 2017-08-08: qty 2

## 2017-08-08 MED ORDER — FAT EMULSION 20 % IV EMUL
250.0000 mL | INTRAVENOUS | Status: AC
  Filled 2017-08-08: qty 250

## 2017-08-08 MED ORDER — BUPIVACAINE LIPOSOME 1.3 % IJ SUSP
INTRAMUSCULAR | Status: AC
  Filled 2017-08-08: qty 20

## 2017-08-08 MED ORDER — TRACE MINERALS CR-CU-MN-SE-ZN 10-1000-500-60 MCG/ML IV SOLN
INTRAVENOUS | Status: AC
  Filled 2017-08-08: qty 984

## 2017-08-08 MED ORDER — ACETAMINOPHEN 10 MG/ML IV SOLN
1000.0000 mg | Freq: Four times a day (QID) | INTRAVENOUS | Status: AC
  Administered 2017-08-09 (×4): 1000 mg via INTRAVENOUS
  Filled 2017-08-08 (×4): qty 100

## 2017-08-08 MED ORDER — DEXTROSE 5 % IV SOLN
2.0000 g | Freq: Four times a day (QID) | INTRAVENOUS | Status: DC
  Administered 2017-08-08 – 2017-08-11 (×12): 2 g via INTRAVENOUS
  Filled 2017-08-08 (×16): qty 2

## 2017-08-08 MED ORDER — HYDROMORPHONE HCL 1 MG/ML IJ SOLN
0.5000 mg | INTRAMUSCULAR | Status: AC | PRN
  Administered 2017-08-08 (×4): 0.5 mg via INTRAVENOUS

## 2017-08-08 MED ORDER — SODIUM CHLORIDE 0.9 % IJ SOLN
INTRAMUSCULAR | Status: AC
  Filled 2017-08-08: qty 50

## 2017-08-08 MED ORDER — MIDAZOLAM HCL 2 MG/2ML IJ SOLN
INTRAMUSCULAR | Status: AC
  Filled 2017-08-08: qty 2

## 2017-08-08 MED ORDER — MENTHOL 3 MG MT LOZG
1.0000 | LOZENGE | OROMUCOSAL | Status: DC | PRN
  Filled 2017-08-08: qty 9

## 2017-08-08 MED ORDER — ONDANSETRON HCL 4 MG/2ML IJ SOLN
INTRAMUSCULAR | Status: AC
  Filled 2017-08-08: qty 2

## 2017-08-08 MED ORDER — DEXMEDETOMIDINE HCL IN NACL 200 MCG/50ML IV SOLN
INTRAVENOUS | Status: DC | PRN
  Administered 2017-08-08 (×2): 12 ug via INTRAVENOUS

## 2017-08-08 MED ORDER — GLYCOPYRROLATE 0.2 MG/ML IJ SOLN
INTRAMUSCULAR | Status: DC | PRN
  Administered 2017-08-08: 0.2 mg via INTRAVENOUS

## 2017-08-08 MED ORDER — FENTANYL CITRATE (PF) 100 MCG/2ML IJ SOLN
25.0000 ug | INTRAMUSCULAR | Status: AC | PRN
  Administered 2017-08-08 (×6): 25 ug via INTRAVENOUS

## 2017-08-08 MED ORDER — ACETAMINOPHEN 10 MG/ML IV SOLN
INTRAVENOUS | Status: AC
  Filled 2017-08-08: qty 100

## 2017-08-08 MED ORDER — MIDAZOLAM HCL 2 MG/2ML IJ SOLN
INTRAMUSCULAR | Status: DC | PRN
  Administered 2017-08-08: 2 mg via INTRAVENOUS

## 2017-08-08 MED ORDER — DEXAMETHASONE SODIUM PHOSPHATE 10 MG/ML IJ SOLN
INTRAMUSCULAR | Status: DC | PRN
Start: 2017-08-08 — End: 2017-08-08
  Administered 2017-08-08: 10 mg via INTRAVENOUS

## 2017-08-08 MED ORDER — SUCCINYLCHOLINE CHLORIDE 20 MG/ML IJ SOLN
INTRAMUSCULAR | Status: DC | PRN
  Administered 2017-08-08: 120 mg via INTRAVENOUS

## 2017-08-08 MED ORDER — DEXMEDETOMIDINE HCL IN NACL 80 MCG/20ML IV SOLN
INTRAVENOUS | Status: AC
  Filled 2017-08-08: qty 20

## 2017-08-08 MED ORDER — THIAMINE HCL 100 MG/ML IJ SOLN
INTRAVENOUS | Status: DC
  Filled 2017-08-08: qty 984

## 2017-08-08 MED ORDER — SUCCINYLCHOLINE CHLORIDE 20 MG/ML IJ SOLN
INTRAMUSCULAR | Status: AC
  Filled 2017-08-08: qty 1

## 2017-08-08 MED ORDER — PROPOFOL 10 MG/ML IV BOLUS
INTRAVENOUS | Status: DC | PRN
  Administered 2017-08-08: 150 mg via INTRAVENOUS

## 2017-08-08 MED ORDER — ACETAMINOPHEN 10 MG/ML IV SOLN
INTRAVENOUS | Status: DC | PRN
  Administered 2017-08-08: 1000 mg via INTRAVENOUS

## 2017-08-08 MED ORDER — LIDOCAINE HCL (CARDIAC) 20 MG/ML IV SOLN
INTRAVENOUS | Status: DC | PRN
  Administered 2017-08-08: 50 mg via INTRAVENOUS

## 2017-08-08 MED ORDER — ROCURONIUM BROMIDE 100 MG/10ML IV SOLN
INTRAVENOUS | Status: DC | PRN
  Administered 2017-08-08 (×2): 25 mg via INTRAVENOUS

## 2017-08-08 MED ORDER — BUPIVACAINE-EPINEPHRINE (PF) 0.25% -1:200000 IJ SOLN
INTRAMUSCULAR | Status: AC
  Filled 2017-08-08: qty 30

## 2017-08-08 MED ORDER — SUGAMMADEX SODIUM 200 MG/2ML IV SOLN
INTRAVENOUS | Status: DC | PRN
  Administered 2017-08-08: 200 mg via INTRAVENOUS

## 2017-08-08 MED ORDER — ONDANSETRON HCL 4 MG/2ML IJ SOLN
4.0000 mg | Freq: Four times a day (QID) | INTRAMUSCULAR | Status: DC
  Administered 2017-08-09 – 2017-08-17 (×32): 4 mg via INTRAVENOUS
  Filled 2017-08-08 (×34): qty 2

## 2017-08-08 MED ORDER — DEXAMETHASONE SODIUM PHOSPHATE 10 MG/ML IJ SOLN
INTRAMUSCULAR | Status: AC
  Filled 2017-08-08: qty 1

## 2017-08-08 MED ORDER — FENTANYL CITRATE (PF) 100 MCG/2ML IJ SOLN
25.0000 ug | INTRAMUSCULAR | Status: AC | PRN
  Administered 2017-08-08 (×2): 25 ug via INTRAVENOUS

## 2017-08-08 MED ORDER — BUPIVACAINE LIPOSOME 1.3 % IJ SUSP
INTRAMUSCULAR | Status: DC | PRN
  Administered 2017-08-08: 20 mL

## 2017-08-08 MED ORDER — PROPOFOL 10 MG/ML IV BOLUS
INTRAVENOUS | Status: AC
  Filled 2017-08-08: qty 20

## 2017-08-08 MED ORDER — LIDOCAINE HCL (PF) 2 % IJ SOLN
INTRAMUSCULAR | Status: AC
  Filled 2017-08-08: qty 10

## 2017-08-08 MED ORDER — FENTANYL CITRATE (PF) 100 MCG/2ML IJ SOLN
INTRAMUSCULAR | Status: AC
  Administered 2017-08-08: 25 ug via INTRAVENOUS
  Filled 2017-08-08: qty 2

## 2017-08-08 MED ORDER — INSULIN ASPART 100 UNIT/ML ~~LOC~~ SOLN
0.0000 [IU] | SUBCUTANEOUS | Status: DC
  Administered 2017-08-09 – 2017-08-10 (×2): 2 [IU] via SUBCUTANEOUS
  Administered 2017-08-10: 3 [IU] via SUBCUTANEOUS
  Administered 2017-08-10: 2 [IU] via SUBCUTANEOUS
  Administered 2017-08-10: 3 [IU] via SUBCUTANEOUS
  Administered 2017-08-10 – 2017-08-11 (×3): 2 [IU] via SUBCUTANEOUS
  Administered 2017-08-11 (×2): 3 [IU] via SUBCUTANEOUS
  Administered 2017-08-11 – 2017-08-12 (×2): 2 [IU] via SUBCUTANEOUS
  Administered 2017-08-12: 3 [IU] via SUBCUTANEOUS
  Administered 2017-08-12 – 2017-08-13 (×4): 2 [IU] via SUBCUTANEOUS
  Administered 2017-08-13: 3 [IU] via SUBCUTANEOUS
  Administered 2017-08-13 – 2017-08-14 (×4): 2 [IU] via SUBCUTANEOUS
  Filled 2017-08-08 (×22): qty 1

## 2017-08-08 MED ORDER — SODIUM CHLORIDE 0.9 % IV SOLN
INTRAVENOUS | Status: DC | PRN
  Administered 2017-08-08: 18:00:00 via INTRAVENOUS

## 2017-08-08 MED ORDER — HYDROMORPHONE HCL 1 MG/ML IJ SOLN
INTRAMUSCULAR | Status: AC
  Administered 2017-08-08: 0.5 mg via INTRAVENOUS
  Filled 2017-08-08: qty 1

## 2017-08-08 MED ORDER — GLYCOPYRROLATE 0.2 MG/ML IJ SOLN
INTRAMUSCULAR | Status: AC
  Filled 2017-08-08: qty 1

## 2017-08-08 MED ORDER — ONDANSETRON HCL 4 MG/2ML IJ SOLN
INTRAMUSCULAR | Status: DC | PRN
  Administered 2017-08-08: 4 mg via INTRAVENOUS

## 2017-08-08 MED ORDER — SODIUM CHLORIDE 0.9 % IV SOLN
INTRAVENOUS | Status: DC | PRN
  Administered 2017-08-08: 17:00:00 via INTRAVENOUS

## 2017-08-08 MED ORDER — ROCURONIUM BROMIDE 50 MG/5ML IV SOLN
INTRAVENOUS | Status: AC
  Filled 2017-08-08: qty 1

## 2017-08-08 MED ORDER — BUPIVACAINE-EPINEPHRINE (PF) 0.25% -1:200000 IJ SOLN
INTRAMUSCULAR | Status: DC | PRN
  Administered 2017-08-08: 30 mL via PERINEURAL

## 2017-08-08 MED ORDER — SODIUM CHLORIDE 0.9 % IJ SOLN
INTRAMUSCULAR | Status: DC | PRN
  Administered 2017-08-08: 50 mL

## 2017-08-08 MED ORDER — FENTANYL CITRATE (PF) 100 MCG/2ML IJ SOLN
INTRAMUSCULAR | Status: DC | PRN
  Administered 2017-08-08: 50 ug via INTRAVENOUS
  Administered 2017-08-08: 100 ug via INTRAVENOUS
  Administered 2017-08-08: 50 ug via INTRAVENOUS

## 2017-08-08 SURGICAL SUPPLY — 48 items
APPLIER CLIP 11 MED OPEN (CLIP)
APPLIER CLIP 13 LRG OPEN (CLIP)
BLADE CLIPPER SURG (BLADE) ×2 IMPLANT
BLADE SURG 15 STRL LF DISP TIS (BLADE) ×1 IMPLANT
BLADE SURG 15 STRL SS (BLADE) ×1
CANISTER SUCT 3000ML PPV (MISCELLANEOUS) ×2 IMPLANT
CHLORAPREP W/TINT 26ML (MISCELLANEOUS) ×2 IMPLANT
CLIP APPLIE 11 MED OPEN (CLIP) IMPLANT
CLIP APPLIE 13 LRG OPEN (CLIP) IMPLANT
DRAPE LAPAROTOMY 100X77 ABD (DRAPES) ×2 IMPLANT
DRAPE TABLE BACK 80X90 (DRAPES) ×2 IMPLANT
DRSG OPSITE POSTOP 4X12 (GAUZE/BANDAGES/DRESSINGS) ×2 IMPLANT
DRSG TEGADERM 2-3/8X2-3/4 SM (GAUZE/BANDAGES/DRESSINGS) ×4 IMPLANT
DRSG TELFA 3X8 NADH (GAUZE/BANDAGES/DRESSINGS) ×2 IMPLANT
ELECT BLADE 6.5 EXT (BLADE) ×2 IMPLANT
ELECT REM PT RETURN 9FT ADLT (ELECTROSURGICAL) ×2
ELECTRODE REM PT RTRN 9FT ADLT (ELECTROSURGICAL) ×1 IMPLANT
GAUZE SPONGE 4X4 12PLY STRL (GAUZE/BANDAGES/DRESSINGS) ×4 IMPLANT
GLOVE BIO SURGEON STRL SZ7 (GLOVE) ×2 IMPLANT
GOWN STRL REUS W/ TWL LRG LVL3 (GOWN DISPOSABLE) ×2 IMPLANT
GOWN STRL REUS W/TWL LRG LVL3 (GOWN DISPOSABLE) ×2
HANDLE SUCTION POOLE (INSTRUMENTS) ×1 IMPLANT
HANDLE YANKAUER SUCT BULB TIP (MISCELLANEOUS) ×2 IMPLANT
LIGASURE IMPACT 36 18CM CVD LR (INSTRUMENTS) IMPLANT
NEEDLE HYPO 22GX1.5 SAFETY (NEEDLE) ×4 IMPLANT
NEEDLE HYPO 25X1 1.5 SAFETY (NEEDLE) ×2 IMPLANT
PACK BASIN MAJOR ARMC (MISCELLANEOUS) ×2 IMPLANT
RELOAD PROXIMATE 75MM BLUE (ENDOMECHANICALS) ×6 IMPLANT
SPONGE LAP 18X18 5 PK (GAUZE/BANDAGES/DRESSINGS) ×2 IMPLANT
SPONGE LAP 18X36 2PK (MISCELLANEOUS) IMPLANT
STAPLER PROXIMATE 75MM BLUE (STAPLE) ×2 IMPLANT
STAPLER SKIN PROX 35W (STAPLE) ×2 IMPLANT
SUCTION POOLE HANDLE (INSTRUMENTS) ×2
SUT PDS AB 0 CT1 27 (SUTURE) ×6 IMPLANT
SUT PDS AB 1 TP1 96 (SUTURE) ×4 IMPLANT
SUT SILK 2 0 (SUTURE) ×1
SUT SILK 2 0 SH CR/8 (SUTURE) ×2 IMPLANT
SUT SILK 2 0SH CR/8 30 (SUTURE) ×2 IMPLANT
SUT SILK 2-0 18XBRD TIE 12 (SUTURE) ×1 IMPLANT
SUT VIC AB 0 CT1 36 (SUTURE) ×4 IMPLANT
SUT VIC AB 2-0 SH 27 (SUTURE) ×2
SUT VIC AB 2-0 SH 27XBRD (SUTURE) ×2 IMPLANT
SYR 30ML LL (SYRINGE) ×4 IMPLANT
SYR 3ML LL SCALE MARK (SYRINGE) ×2 IMPLANT
SYR BULB IRRIG 60ML STRL (SYRINGE) ×2 IMPLANT
TAPE MICROFOAM 4IN (TAPE) ×2 IMPLANT
TRAY FOLEY W/METER SILVER 16FR (SET/KITS/TRAYS/PACK) ×2 IMPLANT
WATER STERILE IRR 1000ML POUR (IV SOLUTION) ×2 IMPLANT

## 2017-08-08 NOTE — Anesthesia Procedure Notes (Signed)
Procedure Name: Intubation Performed by: Rolla Plate Pre-anesthesia Checklist: Patient identified, Patient being monitored, Timeout performed, Emergency Drugs available and Suction available Patient Re-evaluated:Patient Re-evaluated prior to induction Oxygen Delivery Method: Circle system utilized Preoxygenation: Pre-oxygenation with 100% oxygen Induction Type: IV induction, Rapid sequence and Cricoid Pressure applied Laryngoscope Size: Miller and 2 Grade View: Grade I Tube type: Oral Tube size: 7.0 mm Number of attempts: 1 Airway Equipment and Method: Stylet Placement Confirmation: ETT inserted through vocal cords under direct vision,  positive ETCO2 and breath sounds checked- equal and bilateral Secured at: 21 cm Tube secured with: Tape Dental Injury: Teeth and Oropharynx as per pre-operative assessment  Comments: OP clear, NGT to suction

## 2017-08-08 NOTE — Op Note (Signed)
PROCEDURES: 1. Lysis of adhesions  2. Small bowel resection w Primary anastomosis  Pre-operative Diagnosis: SBO  Post-operative Diagnosis: same  Surgeon: Marjory Lies Pabon   Assistants: Dr. Genevive Bi  Anesthesia: General endotracheal anesthesia  ASA Class: 3  Surgeon: Caroleen Hamman , MD FACS  Anesthesia: Gen. with endotracheal tube  Findings: Stenotic lesion within Ileum measuring 5 cms Raw areas peritoneum ( secondary to anticoagulation)  Estimated Blood Loss: 50cc         Specimens:  Small        Complications: none   Procedure Details  The patient was seen again in the Holding Room. The benefits, complications, treatment options, and expected outcomes were discussed with the patient. The risks of bleeding, infection, recurrence of symptoms, failure to resolve symptoms,  bowel injury, any of which could require further surgery were reviewed with the patient.   The patient was taken to Operating Room, identified as Carla [name] and the procedure verified.  A Time Out was held and the above information confirmed.  Prior to the induction of general anesthesia, antibiotic prophylaxis was administered. VTE prophylaxis was in place. General endotracheal anesthesia was then administered and tolerated well. After the induction, the abdomen was prepped with Chloraprep and draped in the sterile fashion. The patient was positioned in the supine position.   There is midline laparotomy performed with a 10 blade knife and electrocautery was used to dissect through the cutaneous tissue. The fascia was elevated between kocher clamps and the peritoneum and the fascia incised with Metzenbaum scissors. Incision extended with electrocautery in standard fashion. There was significant adhesions from the omentum to the abdominal wall that were lysed with a combination of electrocautery and and Metzenbaum scissors.There is also some inter loop that were lysed with scissors. We found a transition zone at the mid  ileum consistent with ischemic stenotic lesion. This measured about 5 cm in length. We created a window within the mesentery of the lesion proximally and distally and divided the segment using a 75 standard GIA stapler. An side-to-side functional end-to-end stapled anastomosis was created in standard fashion with a 75 GIA stapler The mesentery defect was approximated with a 2-0 Vicryl. The anastomosis was widely patent. Ose some areas within the peritoneum that were raw secondary to the coagulation. These areas were controlled with a combination of electrocautery and 2-0 silks. Run the bowel from ligament of Treitz to the terminal ileum without any other pathology. There was no evidence of an acute ischemia. A second look showed no evidence of any bleeding or any other injuries. We changed gloves and place a new tray to close the abdomen with a0 PDS suture in a running fashion and the skin was closed with staples. Liposomal Marcaine was injected on all incision sites under direct visualization. Needle and laparotomy count were correct and there were no immediate occasions  Caroleen Hamman, MD, FACS

## 2017-08-08 NOTE — Anesthesia Post-op Follow-up Note (Signed)
Anesthesia QCDR form completed.        

## 2017-08-08 NOTE — Progress Notes (Signed)
PHARMACY - ADULT TOTAL PARENTERAL NUTRITION CONSULT NOTE   Pharmacy Consult for TPN Indication: small bowel obstruction  Patient Measurements: Height: 5\' 2"  (157.5 cm) Weight: 220 lb 4.8 oz (99.9 kg) IBW/kg (Calculated) : 50.1 TPN AdjBW (KG): 62.6 Body mass index is 40.29 kg/m. Usual Weight:   Assessment:   Goals: Kcal:  1700-2000kcal/day   Protein:  100-110g/day   Fluid:  >1.6L/day   GI:  Endo:  Insulin requirements in the past 24 hours: 0 Lytes: all within normal limits  Renal: Pulm: Cards:  Hepatobil: Neuro: ID:  Best Practices: TPN Access: TPN start date:  Nutritional Goals (per RD recommendation on ): kCal: Protein:   Current Nutrition:   Plan:   Clinimix E5/15 at 51ml/hr with 10 ml MVI, 1 ml of Trace element and 100 mg Thiamine   20% lipid emulsion at 33ml/hr Hold 20% lipid emulsion for first 7 days for ICU patients per ASPEN guidelines (Start date ) Will recheck electrolytes in am.   Carla [name] D 08/08/2017,11:08 AM

## 2017-08-08 NOTE — Progress Notes (Signed)
Nutrition Follow Up Note  DOCUMENTATION CODES:   Severe malnutrition in context of acute illness/injury  INTERVENTION:   Initiate TPN as pt without adequate nutrition for 6 days  Recommend Clinimix 5/15 with electrolytes at 19m/hr + 20% lipids '@20ml'$ /hr x 12 hrs/day (Goal rate 876mhr once labs stable)  Regimen provides 1178kcal/day, 49g/day protein, 122474molume  Add MVI daily   Add IV thiamine '100mg'$  daily   Pt at high refeeding risk; recommend check P, K, and Mg daily for 3 days after TPN iniatition.   Daily weights  Recommend reduce IVF rate to 60m64m- provides 347kcal/day   Recommend check triglycerides   NUTRITION DIAGNOSIS:   Malnutrition (severe) related to acute illness (SBO) as evidenced by energy intake < or equal to 50% for > or equal to 5 days, 5 percent weight loss in 1 week.  GOAL:   Patient will meet greater than or equal to 90% of their needs  MONITOR:   Labs, Weight trends, I & O's, Other (Comment) (TPN)  REASON FOR ASSESSMENT:   Malnutrition Screening Tool    ASSESSMENT:   56 y35. 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 CT scan in the emergency room suggest a bowel obstruction with a transition point in an area where prior ischemia was identified.   Met with pt in room today. Pt reports poor appetite and oral intake for the past month but reports that she has not eaten anything for 6 days. Per chart, pt has lost 10lbs(5%) in one week; this is significant. Pt with NGT in place on suction with 1000ml62mput since yesterday. Pt status post SBFT study  yesterday; noted to have partial obstruction in the proximal ileum. Plan to initiate TPN today.   Medications reviewed and include: heparin, nicotine, NaCl w/ dextrose 5% and KCl '@125ml'$ /hr, morphine   Labs reviewed: Ca 8.4(L) adj. 9.28 wnl, alb 2.9(L) cbgs- 132, 135 x 24 hrs  Nutrition-Focused physical exam completed. Findings are no fat depletion, no muscle depletion, and no edema.   Diet Order:  Diet NPO time specified Except for: Sips with Meds  Skin:  Reviewed, no issues  Last BM:  10/11- type 7  Height:   Ht Readings from Last 1 Encounters:  08/06/17 '5\' 2"'$  (1.575 m)    Weight:   Wt Readings from Last 1 Encounters:  08/06/17 220 lb 4.8 oz (99.9 kg)    Ideal Body Weight:  50 kg  BMI:  Body mass index is 40.29 kg/m.  Estimated Nutritional Needs:   Kcal:  1700-2000kcal/day   Protein:  100-110g/day   Fluid:  >1.6L/day   EDUCATION NEEDS:   Education needs addressed  CaseyKoleen DistanceRD, LDN Pager #- [phone] Hours Pager: [phone]

## 2017-08-08 NOTE — Progress Notes (Signed)
Patient seen on return from PACU. She appears uncomfortable but her vital signs are stable.She is to be  Nothing by mouth for some time with a nsogastric tube. TPN has been orderCC line hasand it is ordered for tomorrow morning. Stable postop.

## 2017-08-08 NOTE — Anesthesia Preprocedure Evaluation (Signed)
Anesthesia Evaluation  Patient identified by MRN, date of birth, ID band Patient awake    Reviewed: Allergy & Precautions, H&P , NPO status , Patient's Chart, lab work & pertinent test results, reviewed documented beta blocker date and time   History of Anesthesia Complications Negative for: history of anesthetic complications  Airway Mallampati: III  TM Distance: >3 FB Neck ROM: full    Dental  (+) Dental Advidsory Given, Edentulous Upper, Edentulous Lower   Pulmonary shortness of breath and with exertion, neg sleep apnea, neg COPD, neg recent URI, Current Smoker,           Cardiovascular Exercise Tolerance: Good hypertension, (-) angina+ Peripheral Vascular Disease and +CHF (diastolic dysfunction)  (-) CAD, (-) Past MI, (-) Cardiac Stents and (-) CABG (-) dysrhythmias Atrial Fibrillation (-) Valvular Problems/Murmurs     Neuro/Psych PSYCHIATRIC DISORDERS (Depression) negative neurological ROS     GI/Hepatic Neg liver ROS, GERD  ,  Endo/Other  neg diabetesMorbid obesity  Renal/GU Renal disease  negative genitourinary   Musculoskeletal   Abdominal   Peds  Hematology negative hematology ROS (+)   Anesthesia Other Findings Past Medical History: No date: Abdominal aortic atherosclerosis ([place])     Comment:  a. 05/2017 CTA abd/pelvis: significant atherosclerotic               dzs of the infrarenal abd Ao w/ some mural thrombus. No               aneurysm or dissection. No date: Baker's cyst of knee, right     Comment:  a. 07/2016 U/S: 4.1 x 1.4 x 2.9 cystic structure in R               poplitetal fossa. No date: Bell's palsy No date: Cancer [facility]) No date: Chest pain     Comment:  a. 08/2012 Lexiscan MV: EF 54%, non ischemia/infarct. No date: Diastolic dysfunction     Comment:  a. 05/2017 Echo: Ef 60-65%, no rwma, Gr1 DD, no source of              cardiac emboli. 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 05/2017 CTA Abd/pelvis: apparent thrombus or embolus               in prox SMA (70-90%); b. 05/2017 catheter directed tPA,               mechanical thrombectomy, and stenting of the SMA. No date: GERD (gastroesophageal reflux disease) No date: Hyperlipidemia No date: Hypertension No date: Morbid obesity with BMI of 40.0-44.9, adult (HCC)   Reproductive/Obstetrics negative OB ROS                             Anesthesia Physical Anesthesia Plan  ASA: III and emergent  Anesthesia Plan: General   Post-op Pain Management:    Induction: Intravenous, Rapid sequence and Cricoid pressure planned  PONV Risk Score and Plan: 2 and Dexamethasone and Ondansetron  Airway Management Planned: Oral ETT  Additional Equipment:   Intra-op Plan:   Post-operative Plan: Extubation in OR  Informed Consent: I have reviewed the patients History and Physical, chart, labs and discussed the procedure including the risks, benefits and alternatives for the proposed anesthesia with the patient or authorized representative who has indicated his/her understanding and acceptance.   Dental Advisory Given  Plan Discussed with: Anesthesiologist, CRNA and Surgeon  Anesthesia Plan Comments:  Anesthesia Quick Evaluation  

## 2017-08-08 NOTE — Progress Notes (Signed)
Pt seen and examined, CT scan and films reviewed. Persistent pain and hight NGT output.  Although the const rast reaches the colon I definitely think we need to address the ischemic stricture.  We will proceed w laparotomy today. Procedure explained w the pt in detail, risks , benefits and possible complications including are not limited to bowel injury hernias, infection, bleeding.she understands and wishes to proceed. We will go ahead and also start TPN and a PICC line given her malnutrition.

## 2017-08-08 NOTE — Transfer of Care (Signed)
Immediate Anesthesia Transfer of Care Note  Patient: Carla [name]  Procedure(s) Performed: EXPLORATORY LAPAROTOMY POSSIBLE BOWEL RESECTION (N/A Abdomen)  Patient Location: PACU  Anesthesia Type:General  Level of Consciousness: awake, alert , oriented and patient cooperative  Airway & Oxygen Therapy: Patient Spontanous Breathing and Patient connected to face mask oxygen  Post-op Assessment: Report given to RN and Post -op Vital signs reviewed and stable  Post vital signs: Reviewed and stable  Last Vitals:  Vitals:   08/08/17 1348 08/08/17 1529  BP: (!) 180/90 (!) 160/100  Pulse: 88 95  Resp: 16 16  Temp: 37 C 37.8 C  SpO2: 96% 96%    Last Pain:  Vitals:   08/08/17 1529  TempSrc: Tympanic  PainSc: 10-Worst pain ever      Patients Stated Pain Goal: 0 (35/32/99 2426)  Complications: No apparent anesthesia complications

## 2017-08-08 NOTE — Anesthesia Postprocedure Evaluation (Signed)
Anesthesia Post Note  Patient: Carla [name]  Procedure(s) Performed: EXPLORATORY LAPAROTOMY POSSIBLE BOWEL RESECTION (N/A Abdomen)  Patient location during evaluation: PACU Anesthesia Type: General Level of consciousness: awake and alert Pain management: pain level controlled Vital Signs Assessment: post-procedure vital signs reviewed and stable Respiratory status: spontaneous breathing, nonlabored ventilation, respiratory function stable and patient connected to nasal cannula oxygen Cardiovascular status: blood pressure returned to baseline and stable Postop Assessment: no apparent nausea or vomiting Anesthetic complications: no     Last Vitals:  Vitals:   08/08/17 2015 08/08/17 2118  BP: (!) 163/96 (!) 156/98  Pulse: 100 (!) 112  Resp: 20 20  Temp: 36.6 C 36.6 C  SpO2: 96% 96%    Last Pain:  Vitals:   08/08/17 2118  TempSrc: Oral  PainSc:                  [name]

## 2017-08-09 ENCOUNTER — Encounter: Payer: Self-pay | Admitting: Surgery

## 2017-08-09 ENCOUNTER — Inpatient Hospital Stay: Payer: Medicaid Other

## 2017-08-09 LAB — CBC
HCT: 35.1 % (ref 35.0–47.0)
HCT: 35 % (ref 35.0–47.0)
Hemoglobin: 11.5 g/dL — ABNORMAL LOW (ref 12.0–16.0)
Hemoglobin: 11.5 g/dL — ABNORMAL LOW (ref 12.0–16.0)
MCH: 29.2 pg (ref 26.0–34.0)
MCH: 28.9 pg (ref 26.0–34.0)
MCHC: 32.9 g/dL (ref 32.0–36.0)
MCHC: 32.9 g/dL (ref 32.0–36.0)
MCV: 88.6 fL (ref 80.0–100.0)
MCV: 87.8 fL (ref 80.0–100.0)
Platelets: 244 10*3/uL (ref 150–440)
Platelets: 255 10*3/uL (ref 150–440)
RBC: 3.96 MIL/uL (ref 3.80–5.20)
RBC: 3.98 MIL/uL (ref 3.80–5.20)
RDW: 15 % — ABNORMAL HIGH (ref 11.5–14.5)
RDW: 15 % — ABNORMAL HIGH (ref 11.5–14.5)
WBC: 10.1 10*3/uL (ref 3.6–11.0)
WBC: 11.1 10*3/uL — ABNORMAL HIGH (ref 3.6–11.0)

## 2017-08-09 LAB — COMPREHENSIVE METABOLIC PANEL
ALT: 22 U/L (ref 14–54)
AST: 26 U/L (ref 15–41)
Albumin: 2.7 g/dL — ABNORMAL LOW (ref 3.5–5.0)
Alkaline Phosphatase: 105 U/L (ref 38–126)
Anion gap: 9 (ref 5–15)
BUN: 9 mg/dL (ref 6–20)
CO2: 25 mmol/L (ref 22–32)
Calcium: 8.1 mg/dL — ABNORMAL LOW (ref 8.9–10.3)
Chloride: 103 mmol/L (ref 101–111)
Creatinine, Ser: 0.95 mg/dL (ref 0.44–1.00)
GFR calc Af Amer: >60 mL/min (ref >60)
GFR calc non Af Amer: >60 mL/min (ref >60)
Glucose, Bld: 190 mg/dL — ABNORMAL HIGH (ref 65–99)
Potassium: 4.4 mmol/L (ref 3.5–5.1)
Sodium: 137 mmol/L (ref 135–145)
Total Bilirubin: 0.4 mg/dL (ref 0.3–1.2)
Total Protein: 6.2 g/dL — ABNORMAL LOW (ref 6.5–8.1)

## 2017-08-09 LAB — PREALBUMIN: Prealbumin: 19.1 mg/dL (ref 18–38)

## 2017-08-09 LAB — BASIC METABOLIC PANEL
Anion gap: 11 (ref 5–15)
BUN: 10 mg/dL (ref 6–20)
CO2: 26 mmol/L (ref 22–32)
Calcium: 8.3 mg/dL — ABNORMAL LOW (ref 8.9–10.3)
Chloride: 102 mmol/L (ref 101–111)
Creatinine, Ser: 0.92 mg/dL (ref 0.44–1.00)
GFR calc Af Amer: >60 mL/min (ref >60)
GFR calc non Af Amer: >60 mL/min (ref >60)
Glucose, Bld: 152 mg/dL — ABNORMAL HIGH (ref 65–99)
Potassium: 4.4 mmol/L (ref 3.5–5.1)
Sodium: 139 mmol/L (ref 135–145)

## 2017-08-09 LAB — DIFFERENTIAL
Basophils Absolute: 0 10*3/uL (ref 0–0.1)
Basophils Relative: 0 %
Eosinophils Absolute: 0 10*3/uL (ref 0–0.7)
Eosinophils Relative: 0 %
Lymphocytes Relative: 8 %
Lymphs Abs: 0.8 10*3/uL — ABNORMAL LOW (ref 1.0–3.6)
Monocytes Absolute: 0.6 10*3/uL (ref 0.2–0.9)
Monocytes Relative: 6 %
Neutro Abs: 8.7 10*3/uL — ABNORMAL HIGH (ref 1.4–6.5)
Neutrophils Relative %: 86 %

## 2017-08-09 LAB — PHOSPHORUS: Phosphorus: 3.3 mg/dL (ref 2.5–4.6)

## 2017-08-09 LAB — APTT: aPTT: 32 seconds (ref 24–36)

## 2017-08-09 LAB — GLUCOSE, CAPILLARY
Glucose-Capillary: 108 mg/dL — ABNORMAL HIGH (ref 65–99)
Glucose-Capillary: 146 mg/dL — ABNORMAL HIGH (ref 65–99)

## 2017-08-09 LAB — HEPARIN LEVEL (UNFRACTIONATED): Heparin Unfractionated: 0.79 IU/mL — ABNORMAL HIGH (ref 0.30–0.70)

## 2017-08-09 LAB — TRIGLYCERIDES: Triglycerides: 119 mg/dL (ref <150)

## 2017-08-09 LAB — MAGNESIUM: Magnesium: 2 mg/dL (ref 1.7–2.4)

## 2017-08-09 MED ORDER — LORAZEPAM 2 MG/ML IJ SOLN
2.0000 mg | Freq: Four times a day (QID) | INTRAMUSCULAR | Status: DC | PRN
  Administered 2017-08-09 – 2017-08-10 (×2): 2 mg via INTRAVENOUS
  Filled 2017-08-09 (×3): qty 1

## 2017-08-09 MED ORDER — PANTOPRAZOLE SODIUM 40 MG IV SOLR
40.0000 mg | Freq: Two times a day (BID) | INTRAVENOUS | Status: DC
  Administered 2017-08-09 – 2017-08-13 (×8): 40 mg via INTRAVENOUS
  Filled 2017-08-09 (×8): qty 40

## 2017-08-09 MED ORDER — HYDROMORPHONE HCL 1 MG/ML IJ SOLN
0.5000 mg | INTRAMUSCULAR | Status: DC | PRN
  Administered 2017-08-09 – 2017-08-10 (×8): 0.5 mg via INTRAVENOUS
  Filled 2017-08-09 (×9): qty 0.5

## 2017-08-09 MED ORDER — HEPARIN (PORCINE) IN NACL 100-0.45 UNIT/ML-% IJ SOLN
900.0000 [IU]/h | INTRAMUSCULAR | Status: DC
  Administered 2017-08-09: 1000 [IU]/h via INTRAVENOUS
  Administered 2017-08-10 – 2017-08-11 (×2): 900 [IU]/h via INTRAVENOUS
  Filled 2017-08-09 (×3): qty 250

## 2017-08-09 MED ORDER — FAT EMULSION 20 % IV EMUL
250.0000 mL | INTRAVENOUS | Status: AC
  Administered 2017-08-09: 250 mL via INTRAVENOUS
  Filled 2017-08-09: qty 250

## 2017-08-09 MED ORDER — SODIUM CHLORIDE 0.9% FLUSH
10.0000 mL | Freq: Two times a day (BID) | INTRAVENOUS | Status: DC
  Administered 2017-08-09 – 2017-08-11 (×6): 10 mL
  Administered 2017-08-12: 30 mL
  Administered 2017-08-12 – 2017-08-17 (×10): 10 mL

## 2017-08-09 MED ORDER — METOPROLOL TARTRATE 5 MG/5ML IV SOLN
5.0000 mg | Freq: Four times a day (QID) | INTRAVENOUS | Status: DC
  Administered 2017-08-09 – 2017-08-12 (×6): 5 mg via INTRAVENOUS
  Filled 2017-08-09 (×8): qty 5

## 2017-08-09 MED ORDER — SODIUM CHLORIDE 0.9% FLUSH: 10.0000 mL | INTRAVENOUS | Status: DC | PRN

## 2017-08-09 MED ORDER — TRACE MINERALS CR-CU-MN-SE-ZN 10-1000-500-60 MCG/ML IV SOLN
INTRAVENOUS | Status: AC
  Administered 2017-08-09: 18:00:00 via INTRAVENOUS
  Filled 2017-08-09: qty 984

## 2017-08-09 NOTE — Progress Notes (Addendum)
PHARMACY - ADULT TOTAL PARENTERAL NUTRITION CONSULT NOTE   Pharmacy Consult for TPN Indication: small bowel obstruction  Patient Measurements: Height: 5\' 2"  (157.5 cm) Weight: 214 lb 3.2 oz (97.2 kg) IBW/kg (Calculated) : 50.1 TPN AdjBW (KG): 62.6 Body mass index is 39.18 kg/m. Usual Weight:   Assessment: RD Spoke to RN, plan to have PICC line placed at 1100 today (no TPN was given yesterday) and initiate TPN at 1800.   Goals: Kcal:  1700-2000kcal/day   Protein:  100-110g/day   Fluid:  >1.6L/day   GI:  Endo:  Insulin requirements in the past 24 hours: 0 Lytes: all within normal limits  Renal: Pulm: Cards:  Hepatobil: Neuro: ID:  Best Practices: TPN Access: TPN start date:  Nutritional Goals (per RD recommendation on ): kCal: Protein:   Current Nutrition:   Plan:   Continue Clinimix E5/15 at 75ml/hr with 10 ml MVI, 1 ml of Trace element and 100 mg Thiamine   20% lipid emulsion at 68ml/hr Will recheck electrolytes in am.   Taivon Haroon D 08/09/2017,12:20 PM

## 2017-08-09 NOTE — Progress Notes (Signed)
Nutrition Follow Up Note  DOCUMENTATION CODES:   Severe malnutrition in context of acute illness/injury  INTERVENTION:   Initiate TPN as pt without adequate nutrition for 7 days  Recommend Clinimix 5/15 with electrolytes at 49ml/hr + 20% lipids @20ml /hr x 12 hrs/day (Goal rate 7ml/hr once labs stable)  Regimen provides 1178kcal/day, 49g/day protein, 127ml volume  Add MVI daily   Add IV thiamine 100mg  daily   Pt at high refeeding risk; recommend check P, K, and Mg daily for 3 days after TPN iniatition.   Daily weights  Recommend reduce IVF rate to 21ml/hr- provides 347kcal/day   NUTRITION DIAGNOSIS:   Malnutrition (severe) related to acute illness (SBO) as evidenced by energy intake < or equal to 50% for > or equal to 5 days, 5 percent weight loss in 1 week.  GOAL:   Patient will meet greater than or equal to 90% of their needs  MONITOR:   Labs, Weight trends, I & O's, Other (Comment) (TPN)  ASSESSMENT:   56 y.o. 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 CT scan in the emergency room suggest a bowel obstruction with a transition point in an area where prior ischemia was identified.   Pt unable to have PICC line placed yesterday. Pt s/p lysis of adhesions and small bowel resection w/ primary anastomosis 10/17 secondary to stenotic lesion within Ileum measuring 5 cms. Spoke to RN, plan to have PICC line placed at 1100 today and initiate TPN at 1800. Per chart, pt's weight is down 6lbs since admit. Pt at high refeeding risk; monitor K, P, and Mg for 3 days after TPN initiation.     Medications reviewed and include: heparin, insulin, nicotine, zofran, cefoxitin, NaCl w/ dextrose 5% and KCl @125ml /hr, morphine   Labs reviewed: K 4.4 wnl, Ca 8.1(L) adj. 9.14 wnl, alb 2.7(L), P 3.3 wnl, Mg 2.0 wnl Triglycerides- 119 cbgs- 132, 135, 190 x 48 hrs  Diet Order:  Diet NPO time specified Except for: Sips with Meds .TPN (CLINIMIX-E) Adult  Skin:  Reviewed, no issues  Last BM:  10/11- type 7  Height:   Ht Readings from Last 1 Encounters:  08/06/17 5\' 2"  (1.575 m)    Weight:   Wt Readings from Last 1 Encounters:  08/08/17 214 lb 3.2 oz (97.2 kg)    Ideal Body Weight:  50 kg  BMI:  Body mass index is 39.18 kg/m.  Estimated Nutritional Needs:   Kcal:  1700-2000kcal/day   Protein:  100-110g/day   Fluid:  >1.6L/day   EDUCATION NEEDS:   Education needs addressed  Koleen Distance MS, RD, LDN Pager #[phone] After Hours Pager: [phone]

## 2017-08-09 NOTE — Progress Notes (Addendum)
ANTICOAGULATION CONSULT NOTE - Initial Consult  Pharmacy Consult for Heparin  Indication: ischemic bowel s/p stent   No Known Allergies  Patient Measurements: Height: 5\' 2"  (157.5 cm) Weight: 214 lb 3.2 oz (97.2 kg) IBW/kg (Calculated) : 50.1 Heparin Dosing Weight: 73  Vital Signs: Temp: 99.3 F (37.4 C) (10/18 2021) Temp Source: Oral (10/18 2021) BP: 161/75 (10/18 2021) Pulse Rate: 70 (10/18 2021)  Labs:  Recent Labs  08/08/17 0718 08/09/17 0433 08/09/17 1013 08/09/17 1331 08/09/17 2119  HGB 11.5* 11.5* 11.5*  --   --   HCT 33.7* 35.1 35.0  --   --   PLT 209 244 255  --   --   APTT  --   --   --  32  --   HEPARINUNFRC  --   --   --   --  0.79*  CREATININE 0.95 0.95 0.92  --   --     Estimated Creatinine Clearance: 73.4 mL/min (by C-G formula based on SCr of 0.92 mg/dL).   Medical History: Past Medical History:  Diagnosis Date  . Abdominal aortic atherosclerosis ([place])    a. 05/2017 CTA abd/pelvis: significant atherosclerotic dzs of the infrarenal abd Ao w/ some mural thrombus. No aneurysm or dissection.  . Baker's cyst of knee, right    a. 07/2016 U/S: 4.1 x 1.4 x 2.9 cystic structure in R poplitetal fossa.  . Bell's palsy   . Cancer ([place])   . Chest pain    a. 08/2012 Lexiscan MV: EF 54%, non ischemia/infarct.  . Diastolic dysfunction    a. 05/2017 Echo: Ef 60-65%, no rwma, Gr1 DD, no source of cardiac emboli.  . 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 05/2017 CTA Abd/pelvis: apparent thrombus or embolus in prox SMA (70-90%); b. 05/2017 catheter directed tPA, mechanical thrombectomy, and stenting of the SMA.  [name] GERD (gastroesophageal reflux disease)   . Hyperlipidemia   . Hypertension   . Morbid obesity with BMI of 40.0-44.9, adult (HCC)     Medications:  Prescriptions Prior to Admission  Medication Sig Dispense Refill Last Dose  . apixaban (ELIQUIS) 5 MG TABS tablet Take 1 tablet (5 mg total) by mouth 2 (two) times daily. 60 tablet 5 08/06/2017  at 0800  . atorvastatin (LIPITOR) 80 MG tablet Take 80 mg by mouth at bedtime.   08/05/2017 at 2000  . cephALEXin (KEFLEX) 500 MG capsule Take 1 capsule (500 mg total) by mouth every 12 (twelve) hours. 10 capsule 0 08/06/2017 at 0800  . esomeprazole (NEXIUM) 40 MG capsule Take 40 mg by mouth 2 (two) times daily before a meal.    08/06/2017 at 0800  . gabapentin (NEURONTIN) 300 MG capsule Take 300 mg by mouth 2 (two) times daily.   08/06/2017 at 0800  . lisinopril (PRINIVIL,ZESTRIL) 40 MG tablet Take 40 mg by mouth daily.   08/06/2017 at 0800  . metoprolol tartrate (LOPRESSOR) 50 MG tablet Take 50 mg by mouth 2 (two) times daily.   08/06/2017 at 0800  . senna-docusate (SENOKOT-S) 8.6-50 MG tablet Take 2 tablets by mouth 2 (two) times daily.   08/06/2017 at 0800  . Vitamin D, Ergocalciferol, (DRISDOL) 50000 units CAPS capsule Take 1 capsule by mouth once a week.   Past Week at 0800  . acetaminophen (TYLENOL) 325 MG tablet Take 2 tablets (650 mg total) by mouth every 6 (six) hours as needed for mild pain (or Fever >/= 101).   prn at prn  . ALPRAZolam (  XANAX) 0.5 MG tablet Take 1 tablet (0.5 mg total) by mouth 2 (two) times daily as needed for anxiety. (Patient not taking: Reported on 07/29/2017) 10 tablet 0 Not Taking at Unknown time  . nicotine (NICODERM CQ - DOSED IN MG/24 HOURS) 14 mg/24hr patch Place 1 patch (14 mg total) onto the skin daily. (Patient not taking: Reported on 08/06/2017) 28 patch 0 Not Taking at Unknown time  . oxybutynin (DITROPAN) 5 MG tablet Take 1 tablet by mouth 3 (three) times daily as needed.   prn at prn  . polyethylene glycol (MIRALAX / GLYCOLAX) packet Take 17 g by mouth daily. (Patient not taking: Reported on 08/06/2017) 14 each 0 Not Taking at Unknown time  . traMADol (ULTRAM) 50 MG tablet Take 50 mg by mouth every 6 (six) hours as needed for moderate pain.    prn at prn   Scheduled:  . gabapentin  300 mg Oral BID  . insulin aspart  0-15 Units Subcutaneous Q4H  .  lisinopril  40 mg Oral Daily  . metoprolol tartrate  5 mg Intravenous Q6H  . metoprolol tartrate  50 mg Oral BID  . nicotine  14 mg Transdermal Daily  . ondansetron (ZOFRAN) IV  4 mg Intravenous Q6H  . pantoprazole (PROTONIX) IV  40 mg Intravenous BID  . sodium chloride flush  10-40 mL Intracatheter Q12H    Assessment: Pharmacy consulted to dose and monitor heparin in this 56 year old female being treated for ischemic bowel status post stent therapy;.  10/18 21:00 HL resulted at 0.79  Goal of Therapy:  Heparin level 0.3-0.7 units/ml Monitor platelets by anticoagulation protocol: Yes   Plan:  Will decrease drip rate to 900 units/hr and check next Heparin level in 6hr.  Paulina Fusi, PharmD, BCPS 08/09/2017 10:14 PM    10/19 0400 heparin level 0.60. Recheck in 6 hours to confirm.  Sim Boast, PharmD, BCPS  08/10/17 5:09 AM

## 2017-08-09 NOTE — Progress Notes (Signed)
Foley removed per MD order at 1400.

## 2017-08-09 NOTE — Progress Notes (Signed)
POD # 1 s/p bowel resection  Doing well AVSS Creat and hb stable  PE NAD Abd: [place] intact, no peritonitis  A/p keep ngt, suspect ileus,  TPN  May start anticoagulation today w heparin ( Pharm consult) PT consult to increase mobility

## 2017-08-09 NOTE — Plan of Care (Signed)
Problem: Fluid Volume: Goal: Ability to maintain a balanced intake and output will improve Outcome: Not Progressing Patient is NPO except sips with meds.  Problem: Nutrition: Goal: Adequate nutrition will be maintained Outcome: Not Progressing Patient is NPO except sips with meds.

## 2017-08-09 NOTE — Plan of Care (Signed)
Problem: Nutrition: Goal: Adequate nutrition will be maintained Outcome: Progressing TPN started

## 2017-08-09 NOTE — Progress Notes (Signed)
Peripherally Inserted Central Catheter/Midline Placement  The IV Nurse has discussed with the patient and/or persons authorized to consent for the patient, the purpose of this procedure and the potential benefits and risks involved with this procedure.  The benefits include less needle sticks, lab draws from the catheter, and the patient may be discharged home with the catheter. Risks include, but not limited to, infection, bleeding, blood clot (thrombus formation), and puncture of an artery; nerve damage and irregular heartbeat and possibility to perform a PICC exchange if needed/ordered by physician.  Alternatives to this procedure were also discussed.  Bard Power PICC patient education guide, fact sheet on infection prevention and patient information card has been provided to patient /or left at bedside.    PICC/Midline Placement Documentation  PICC Double Lumen 67/34/19 PICC Right Basilic 40 cm 2 cm (Active)  Indication for Insertion or Continuance of Line Administration of hyperosmolar/irritating solutions (i.e. TPN, Vancomycin, etc.) 08/09/2017  1:11 PM  Exposed Catheter (cm) 2 cm 08/09/2017  1:11 PM  Site Assessment Clean;Dry;Intact 08/09/2017  1:11 PM  Lumen #1 Status Flushed;Saline locked;Blood return noted 08/09/2017  1:11 PM  Lumen #2 Status Flushed;Saline locked;Blood return noted 08/09/2017  1:11 PM  Dressing Type Transparent 08/09/2017  1:11 PM  Dressing Status Clean;Dry;Intact;Antimicrobial disc in place 08/09/2017  1:11 PM  Dressing Change Due 08/16/17 08/09/2017  1:11 PM       Yahya Boldman, Nicolette Bang 08/09/2017, 1:14 PM

## 2017-08-09 NOTE — Progress Notes (Signed)
Called Dr. Burt Knack regarding patient complaint of chest tightness.  Instructed to give po metoprolol that patient refused earlier.  Continuing to monitor patient.  Carla [name] N  08/09/2017  2:16 AM

## 2017-08-09 NOTE — Care Management (Signed)
Post op bowel resection.  Self pay.  PCP Murthi . Obtains medications from Medication Management. Patient currently with NG, plan for PICC and TPN

## 2017-08-09 NOTE — Progress Notes (Signed)
ANTICOAGULATION CONSULT NOTE - Initial Consult  Pharmacy Consult for Heparin  Indication: ischemic bowel s/p stent   No Known Allergies  Patient Measurements: Height: 5\' 2"  (157.5 cm) Weight: 214 lb 3.2 oz (97.2 kg) IBW/kg (Calculated) : 50.1 Heparin Dosing Weight: 73  Vital Signs: Temp: 98.4 F (36.9 C) (10/18 0742) Temp Source: Oral (10/18 0742) BP: 183/89 (10/18 1131) Pulse Rate: 78 (10/18 1131)  Labs:  Recent Labs  08/08/17 0718 08/09/17 0433 08/09/17 1013  HGB 11.5* 11.5* 11.5*  HCT 33.7* 35.1 35.0  PLT 209 244 255  CREATININE 0.95 0.95 0.92    Estimated Creatinine Clearance: 73.4 mL/min (by C-G formula based on SCr of 0.92 mg/dL).   Medical History: Past Medical History:  Diagnosis Date  . Abdominal aortic atherosclerosis ([place])    a. 05/2017 CTA abd/pelvis: significant atherosclerotic dzs of the infrarenal abd Ao w/ some mural thrombus. No aneurysm or dissection.  . Baker's cyst of knee, right    a. 07/2016 U/S: 4.1 x 1.4 x 2.9 cystic structure in R poplitetal fossa.  . Bell's palsy   . Cancer ([place])   . Chest pain    a. 08/2012 Lexiscan MV: EF 54%, non ischemia/infarct.  . Diastolic dysfunction    a. 05/2017 Echo: Ef 60-65%, no rwma, Gr1 DD, no source of cardiac emboli.  . 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 05/2017 CTA Abd/pelvis: apparent thrombus or embolus in prox SMA (70-90%); b. 05/2017 catheter directed tPA, mechanical thrombectomy, and stenting of the SMA.  [name] GERD (gastroesophageal reflux disease)   . Hyperlipidemia   . Hypertension   . Morbid obesity with BMI of 40.0-44.9, adult (HCC)     Medications:  Prescriptions Prior to Admission  Medication Sig Dispense Refill Last Dose  . apixaban (ELIQUIS) 5 MG TABS tablet Take 1 tablet (5 mg total) by mouth 2 (two) times daily. 60 tablet 5 08/06/2017 at 0800  . atorvastatin (LIPITOR) 80 MG tablet Take 80 mg by mouth at bedtime.   08/05/2017 at 2000  . cephALEXin (KEFLEX) 500 MG capsule  Take 1 capsule (500 mg total) by mouth every 12 (twelve) hours. 10 capsule 0 08/06/2017 at 0800  . esomeprazole (NEXIUM) 40 MG capsule Take 40 mg by mouth 2 (two) times daily before a meal.    08/06/2017 at 0800  . gabapentin (NEURONTIN) 300 MG capsule Take 300 mg by mouth 2 (two) times daily.   08/06/2017 at 0800  . lisinopril (PRINIVIL,ZESTRIL) 40 MG tablet Take 40 mg by mouth daily.   08/06/2017 at 0800  . metoprolol tartrate (LOPRESSOR) 50 MG tablet Take 50 mg by mouth 2 (two) times daily.   08/06/2017 at 0800  . senna-docusate (SENOKOT-S) 8.6-50 MG tablet Take 2 tablets by mouth 2 (two) times daily.   08/06/2017 at 0800  . Vitamin D, Ergocalciferol, (DRISDOL) 50000 units CAPS capsule Take 1 capsule by mouth once a week.   Past Week at 0800  . acetaminophen (TYLENOL) 325 MG tablet Take 2 tablets (650 mg total) by mouth every 6 (six) hours as needed for mild pain (or Fever >/= 101).   prn at prn  . ALPRAZolam (XANAX) 0.5 MG tablet Take 1 tablet (0.5 mg total) by mouth 2 (two) times daily as needed for anxiety. (Patient not taking: Reported on 07/29/2017) 10 tablet 0 Not Taking at Unknown time  . nicotine (NICODERM CQ - DOSED IN MG/24 HOURS) 14 mg/24hr patch Place 1 patch (14 mg total) onto the skin daily. (Patient  not taking: Reported on 08/06/2017) 28 patch 0 Not Taking at Unknown time  . oxybutynin (DITROPAN) 5 MG tablet Take 1 tablet by mouth 3 (three) times daily as needed.   prn at prn  . polyethylene glycol (MIRALAX / GLYCOLAX) packet Take 17 g by mouth daily. (Patient not taking: Reported on 08/06/2017) 14 each 0 Not Taking at Unknown time  . traMADol (ULTRAM) 50 MG tablet Take 50 mg by mouth every 6 (six) hours as needed for moderate pain.    prn at prn   Scheduled:  . gabapentin  300 mg Oral BID  . insulin aspart  0-15 Units Subcutaneous Q4H  . lisinopril  40 mg Oral Daily  . metoprolol tartrate  5 mg Intravenous Q6H  . metoprolol tartrate  50 mg Oral BID  . nicotine  14 mg Transdermal  Daily  . ondansetron (ZOFRAN) IV  4 mg Intravenous Q6H    Assessment: Pharmacy consulted to dose and monitor heparin in this 56 year old female being treated for ischemic bowel status post stent therapy;[name] Goal of Therapy:  Heparin level 0.3-0.7 units/ml Monitor platelets by anticoagulation protocol: Yes   Plan:  No bolus will be given per MD. Will start Heparin gtt @ 1000 units/hr. Will check Heparin level @ 1900.   Marcelis Wissner D 08/09/2017,12:26 PM

## 2017-08-09 NOTE — Progress Notes (Signed)
Called Dr. Burt Knack regarding patient complaints of tightness in chest.  Doctor ordered an EKG and portable chest xray.  Carla [name] 08/09/2017  08/09/2017  6:22 AM

## 2017-08-10 DIAGNOSIS — K5904 Chronic idiopathic constipation: Secondary | ICD-10-CM

## 2017-08-10 LAB — COMPREHENSIVE METABOLIC PANEL
ALT: 16 U/L (ref 14–54)
AST: 19 U/L (ref 15–41)
Albumin: 2.6 g/dL — ABNORMAL LOW (ref 3.5–5.0)
Alkaline Phosphatase: 95 U/L (ref 38–126)
Anion gap: 6 (ref 5–15)
BUN: 10 mg/dL (ref 6–20)
CO2: 28 mmol/L (ref 22–32)
Calcium: 8.2 mg/dL — ABNORMAL LOW (ref 8.9–10.3)
Chloride: 104 mmol/L (ref 101–111)
Creatinine, Ser: 0.84 mg/dL (ref 0.44–1.00)
GFR calc Af Amer: >60 mL/min (ref >60)
GFR calc non Af Amer: >60 mL/min (ref >60)
Glucose, Bld: 150 mg/dL — ABNORMAL HIGH (ref 65–99)
Potassium: 4.2 mmol/L (ref 3.5–5.1)
Sodium: 138 mmol/L (ref 135–145)
Total Bilirubin: 0.3 mg/dL (ref 0.3–1.2)
Total Protein: 6.2 g/dL — ABNORMAL LOW (ref 6.5–8.1)

## 2017-08-10 LAB — HEPARIN LEVEL (UNFRACTIONATED)
Heparin Unfractionated: 0.6 IU/mL (ref 0.30–0.70)
Heparin Unfractionated: 0.51 IU/mL (ref 0.30–0.70)

## 2017-08-10 LAB — PHOSPHORUS: Phosphorus: 3.1 mg/dL (ref 2.5–4.6)

## 2017-08-10 LAB — GLUCOSE, CAPILLARY
Glucose-Capillary: 102 mg/dL — ABNORMAL HIGH (ref 65–99)
Glucose-Capillary: 136 mg/dL — ABNORMAL HIGH (ref 65–99)
Glucose-Capillary: 145 mg/dL — ABNORMAL HIGH (ref 65–99)
Glucose-Capillary: 149 mg/dL — ABNORMAL HIGH (ref 65–99)
Glucose-Capillary: 152 mg/dL — ABNORMAL HIGH (ref 65–99)
Glucose-Capillary: 156 mg/dL — ABNORMAL HIGH (ref 65–99)

## 2017-08-10 LAB — CBC
HCT: 32.7 % — ABNORMAL LOW (ref 35.0–47.0)
Hemoglobin: 11.3 g/dL — ABNORMAL LOW (ref 12.0–16.0)
MCH: 30.4 pg (ref 26.0–34.0)
MCHC: 34.5 g/dL (ref 32.0–36.0)
MCV: 88.1 fL (ref 80.0–100.0)
Platelets: 239 10*3/uL (ref 150–440)
RBC: 3.71 MIL/uL — ABNORMAL LOW (ref 3.80–5.20)
RDW: 14.8 % — ABNORMAL HIGH (ref 11.5–14.5)
WBC: 11 10*3/uL (ref 3.6–11.0)

## 2017-08-10 LAB — SURGICAL PATHOLOGY

## 2017-08-10 LAB — MAGNESIUM: Magnesium: 1.9 mg/dL (ref 1.7–2.4)

## 2017-08-10 MED ORDER — SODIUM CHLORIDE 0.9 % IV SOLN
INTRAVENOUS | Status: DC
  Administered 2017-08-10 – 2017-08-11 (×2): via INTRAVENOUS

## 2017-08-10 MED ORDER — THIAMINE HCL 100 MG/ML IJ SOLN
INTRAVENOUS | Status: AC
  Administered 2017-08-10: 19:00:00 via INTRAVENOUS
  Filled 2017-08-10: qty 1992

## 2017-08-10 MED ORDER — FAT EMULSION 20 % IV EMUL
250.0000 mL | INTRAVENOUS | Status: AC
  Administered 2017-08-10: 250 mL via INTRAVENOUS
  Filled 2017-08-10: qty 250

## 2017-08-10 MED ORDER — ACETAMINOPHEN 10 MG/ML IV SOLN
1000.0000 mg | Freq: Four times a day (QID) | INTRAVENOUS | Status: AC
  Administered 2017-08-10 – 2017-08-11 (×4): 1000 mg via INTRAVENOUS
  Filled 2017-08-10 (×5): qty 100

## 2017-08-10 MED ORDER — KETOROLAC TROMETHAMINE 15 MG/ML IJ SOLN
15.0000 mg | Freq: Four times a day (QID) | INTRAMUSCULAR | Status: DC
  Administered 2017-08-10 – 2017-08-12 (×8): 15 mg via INTRAVENOUS
  Filled 2017-08-10 (×8): qty 1

## 2017-08-10 MED ORDER — SODIUM CHLORIDE 0.9 % IV BOLUS (SEPSIS)
1000.0000 mL | Freq: Once | INTRAVENOUS | Status: AC
  Administered 2017-08-10: 1000 mL via INTRAVENOUS

## 2017-08-10 MED ORDER — LISINOPRIL 20 MG PO TABS: 20.0000 mg | ORAL_TABLET | Freq: Every day | ORAL | Status: DC

## 2017-08-10 NOTE — Progress Notes (Signed)
Physical Therapy Evaluation Patient Details Name: Carla [name] MRN: [id number] DOB: September 17, 1961 Today's Date: 08/10/2017   History of Present Illness  Pt admitted for small bowel obstruction, in August 2018 pt seen for superior mesenteric artery embolism s/p thrombectomy with stent placement. Pt complaints of lower abdominal pain and not passing gas or having a BM since last week. On 10/17 had exploratory laparotomy and possible bowel resection.  Clinical Impression  Pt is a pleasant 56 year old female admitted for small bowel obstruction. Pt performed supine there-ex and bed mobility with min-mod assist. Pt able to roll onto both sides with min assist and maintain position to change brief. Pt able to rise from supine to seated EOB with mod assist, +2 for physical assistance, able to maintain position for several minutes. Pt stated multiples times she did not want to try standing due to pain. Pt limited in PT participation due to pain, required significant encouragement from PT and RN to perform activities, and cues to breathe. O2 sats on room air >95% throughout. Pt demonstrates deficits with strength, endurance and mobility activities. Would benefit from further skilled PT services to address above deficits and promote optimal return to home. Recommend transition to [place] upon DC from acute hospitalization    Follow Up Recommendations Home health PT    Equipment Recommendations  None recommended by PT    Recommendations for Other Services       Precautions / Restrictions Precautions Precautions: Fall Restrictions Weight Bearing Restrictions: No      Mobility  Bed Mobility Overal bed mobility: Needs Assistance Bed Mobility: Supine to Sit     Supine to sit: Mod assist;+2 for physical assistance     General bed mobility comments: Pt able to follow commands for proper sequencing, able to grasp bed rail for UE support and move LEs off bed. Pt required several rest breaks due to pain.  +2 assist to help guide trunk and LE to proper position. Pt able to get to seated EOB.   Transfers                 General transfer comment: Deferred, pt limited due to pain  Ambulation/Gait             General Gait Details: Deferred, pt limited due to pain  Stairs            Wheelchair Mobility    Modified Rankin (Stroke Patients Only)       Balance Overall balance assessment: Needs assistance Sitting-balance support: Feet supported Sitting balance-[name]: Fair Sitting balance - Comments: Pt able to sit at EOB and maintain position for several minutes, hands on bed rail to support upright position, CGA for safety        Standing balance comment: Deferred                             Pertinent Vitals/Pain Pain Assessment: 0-10 Pain Score: 10-Worst pain ever Pain Location: stomach Pain Descriptors / Indicators: Sore Pain Intervention(s): Limited activity within patient's tolerance;Monitored during session;Repositioned;Other (comment) (communicated with RN prior to session about status with meds)    Home Living Family/patient expects to be discharged to:: Other (Comment) (Pt reports living in motel room for past 6 years) Living Arrangements: Spouse/significant other Available Help at Discharge: Family Type of Home: Other(Comment) (motel room) Home Access: Level entry     Home Layout: One level Home Equipment: Environmental consultant - 2 wheels  Prior Function Level of Independence: Independent with assistive device(s)         Comments: Pt states being independent with ADLs, uses a RW for amb, still drives.      Hand Dominance        Extremity/Trunk Assessment   Upper Extremity Assessment Upper Extremity Assessment: Generalized weakness (UE MMT grossly 3+/5, difficult to assess due to pain)    Lower Extremity Assessment Lower Extremity Assessment: Generalized weakness (LE MMT grossly 3+/5, difficult to assess due to pain)     Cervical / Trunk Assessment Cervical / Trunk Assessment: Normal  Communication   Communication: No difficulties  Cognition Arousal/Alertness: Lethargic Behavior During Therapy: Anxious Overall Cognitive Status: Within Functional Limits for tasks assessed                                        General Comments      Exercises Other Exercises Other Exercises: Supine ther-ex 10x, ankle pumps, hip abd/add, heel slides. Cues for proper technique and min assist due to pt reports of pain.  Other Exercises: Assisted with changing pt's brief and bed chuck, pt able to roll onto both sides while utilizing bed rail, able to maintain position to change brief. Cues for proper rolling technique   Assessment/Plan    PT Assessment Patient needs continued PT services  PT Problem List Decreased strength;Decreased range of motion;Decreased activity tolerance;Decreased balance;Decreased mobility;Decreased coordination;Decreased knowledge of use of DME;Pain       PT Treatment Interventions DME instruction;Gait training;Functional mobility training;Therapeutic activities;Therapeutic exercise;Stair training;Balance training;Patient/family education    PT Goals (Current goals can be found in the Care Plan section)  Acute Rehab PT Goals Patient Stated Goal: to decrease pain PT Goal Formulation: With patient Potential to Achieve Goals: Good    Frequency Min 2X/week   Barriers to discharge        Co-evaluation               AM-PAC PT "6 Clicks" Daily Activity  Outcome Measure Difficulty turning over in bed (including adjusting bedclothes, sheets and blankets)?: Unable Difficulty moving from lying on back to sitting on the side of the bed? : Unable Difficulty sitting down on and standing up from a chair with arms (e.g., wheelchair, bedside commode, etc,.)?: Unable Help needed moving to and from a bed to chair (including a wheelchair)?: A Little Help needed walking in  hospital room?: A Little Help needed climbing 3-5 steps with a railing? : A Lot 6 Click Score: 11    End of Session   Activity Tolerance: Patient limited by pain Patient left: in bed;with call bell/phone within reach;with nursing/sitter in room Nurse Communication: Mobility status;Patient requests pain meds;Other (comment) (RN present in room to provide +2 assist and encouragement ) PT Visit Diagnosis: Other abnormalities of gait and mobility (R26.89);Muscle weakness (generalized) (M62.81);Pain Pain - part of body:  (stomach)    Time: 1245-8099 PT Time Calculation (min) (ACUTE ONLY): 48 min   Charges:         PT G Codes:   PT G-Codes **NOT FOR INPATIENT CLASS** Functional Assessment Tool Used: AM-PAC 6 Clicks Basic Mobility Functional Limitation: Mobility: Walking and moving around Mobility: Walking and Moving Around Current Status (I3382): At least 60 percent but less than 80 percent impaired, limited or restricted Mobility: Walking and Moving Around Goal Status [phone]): At least 40 percent but less than 60 percent impaired,  limited or restricted    Manfred Arch, SPT  Manfred Arch 08/10/2017, 1:53 PM

## 2017-08-10 NOTE — Progress Notes (Addendum)
PHARMACY - ADULT TOTAL PARENTERAL NUTRITION CONSULT NOTE   Pharmacy Consult for TPN Indication: small bowel obstruction  Patient Measurements: Height: 5\' 2"  (157.5 cm) Weight: 214 lb 3.2 oz (97.2 kg) IBW/kg (Calculated) : 50.1 TPN AdjBW (KG): 62.6 Body mass index is 39.18 kg/m. Usual Weight:   Assessment:  Goals: Kcal:  1700-2000kcal/day   Protein:  100-110g/day   Fluid:  >1.6L/day   GI:  Endo:  Insulin requirements in the past 24 hours: 11 Lytes: all within normal limits  Renal: Pulm: Cards:  Hepatobil: Neuro: ID:  Best Practices: TPN Access: TPN start date:  Nutritional Goals (per RD recommendation on ): kCal: Protein:   Current Nutrition:   Plan:   Continue Clinimix E5/15 at 83 ml/hr with 10 ml MVI, 1 ml of Trace element and 100 mg Thiamine   20% lipid emulsion at 92ml/hr Will recheck electrolytes in am.   Carla [name] D 08/10/2017,1:06 PM

## 2017-08-10 NOTE — Progress Notes (Signed)
POD # 2 heparin started hb stable No flatus Pain continues to be an issue Creat ok AVSS  PE NAD Abd: soft, decrease bs. Incisions c/d/i  A/P Doing well IV acetaminophen for adjuvant pain control Prolonged ileus Continue NPO, NGT, TPN mobilize

## 2017-08-10 NOTE — Plan of Care (Signed)
Problem: Activity: Goal: Ability to tolerate increased activity will improve Outcome: Progressing Pt was able to sit on edge of bed today.

## 2017-08-10 NOTE — Progress Notes (Addendum)
Nutrition Follow Up Note  DOCUMENTATION CODES:   Severe malnutrition in context of acute illness/injury  INTERVENTION:   Advance Clinimix 5/15 with electrolytes to goal rate of 10ml/hr  Continue 20% lipids @20ml /hr x 12 hrs/day  Regimen provides 1894kcal/day, 100g/day protein, 2264ml volume  Continue MVI daily   Continue IV thiamine 100mg  daily   Pt at high refeeding risk; continue to monitor P, K, and Mg daily for 2 days   Daily weights  Recommend discontinue IVF   NUTRITION DIAGNOSIS:   Malnutrition (severe) related to acute illness (SBO) as evidenced by energy intake < or equal to 50% for > or equal to 5 days, 5 percent weight loss in 1 week.  GOAL:   Patient will meet greater than or equal to 90% of their needs  MONITOR:   Labs, Weight trends, I & O's, Other (Comment) (TPN)  ASSESSMENT:   56 y.o. 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 CT scan in the emergency room suggest a bowel obstruction with a transition point in an area where prior ischemia was identified.   PICC Double Lumen 16/10/96 PICC Right Basilic 40 cm 2 cm   Pt initiated on TPN yesterday; tolerating well. Recommend advance to goal rate today. Pt with NGT in place. Will order daily weights. Pt with 6lb weight loss since admit.    Medications reviewed and include: insulin, nicotine, zofran, protonix, cefoxitin, heparin, NaCl w/ dextrose 5% and KCl @125ml /hr, morphine   Labs reviewed: K 4.2 wnl, Ca 8.2(L) adj. 9.32 wnl, alb 2.6(L), P 3.1 wnl, Mg 1.9 wnl Prealbumin- 19.1- 10/18 triglycerides 119- 10/18   cbgs- 190, 152, 150 x 24 hrs  Diet Order:  Diet NPO time specified Except for: Sips with Meds TPN (CLINIMIX-E) Adult  Skin:  Reviewed, no issues  Last BM:  10/11- type 7  Height:   Ht Readings from Last 1 Encounters:  08/06/17 5\' 2"  (1.575 m)    Weight:   Wt Readings from Last 1 Encounters:  08/08/17 214 lb 3.2 oz (97.2 kg)    Ideal Body Weight:  50 kg  BMI:  Body mass index is 39.18 kg/m.  Estimated Nutritional Needs:   Kcal:  1700-2000kcal/day   Protein:  100-110g/day   Fluid:  >1.6L/day   EDUCATION NEEDS:   Education needs addressed  Koleen Distance MS, RD, LDN Pager #[phone] After Hours Pager: [phone]

## 2017-08-11 ENCOUNTER — Inpatient Hospital Stay: Payer: Medicaid Other

## 2017-08-11 LAB — CBC
HCT: 30.3 % — ABNORMAL LOW (ref 35.0–47.0)
Hemoglobin: 10.1 g/dL — ABNORMAL LOW (ref 12.0–16.0)
MCH: 29.9 pg (ref 26.0–34.0)
MCHC: 33.4 g/dL (ref 32.0–36.0)
MCV: 89.3 fL (ref 80.0–100.0)
Platelets: 232 10*3/uL (ref 150–440)
RBC: 3.39 MIL/uL — ABNORMAL LOW (ref 3.80–5.20)
RDW: 14.7 % — ABNORMAL HIGH (ref 11.5–14.5)
WBC: 9.5 10*3/uL (ref 3.6–11.0)

## 2017-08-11 LAB — BASIC METABOLIC PANEL
Anion gap: 5 (ref 5–15)
BUN: 20 mg/dL (ref 6–20)
CO2: 25 mmol/L (ref 22–32)
Calcium: 8.2 mg/dL — ABNORMAL LOW (ref 8.9–10.3)
Chloride: 106 mmol/L (ref 101–111)
Creatinine, Ser: 1 mg/dL (ref 0.44–1.00)
GFR calc Af Amer: >60 mL/min (ref >60)
GFR calc non Af Amer: >60 mL/min (ref >60)
Glucose, Bld: 118 mg/dL — ABNORMAL HIGH (ref 65–99)
Potassium: 3.9 mmol/L (ref 3.5–5.1)
Sodium: 136 mmol/L (ref 135–145)

## 2017-08-11 LAB — GLUCOSE, CAPILLARY
Glucose-Capillary: 122 mg/dL — ABNORMAL HIGH (ref 65–99)
Glucose-Capillary: 123 mg/dL — ABNORMAL HIGH (ref 65–99)
Glucose-Capillary: 130 mg/dL — ABNORMAL HIGH (ref 65–99)
Glucose-Capillary: 135 mg/dL — ABNORMAL HIGH (ref 65–99)
Glucose-Capillary: 151 mg/dL — ABNORMAL HIGH (ref 65–99)
Glucose-Capillary: 167 mg/dL — ABNORMAL HIGH (ref 65–99)

## 2017-08-11 LAB — HEPARIN LEVEL (UNFRACTIONATED): Heparin Unfractionated: 0.43 IU/mL (ref 0.30–0.70)

## 2017-08-11 MED ORDER — METOCLOPRAMIDE HCL 5 MG/ML IJ SOLN
5.0000 mg | Freq: Four times a day (QID) | INTRAMUSCULAR | Status: AC
  Administered 2017-08-11 – 2017-08-13 (×8): 5 mg via INTRAVENOUS
  Filled 2017-08-11 (×8): qty 2

## 2017-08-11 MED ORDER — FLEET ENEMA 7-19 GM/118ML RE ENEM: 1.0000 | ENEMA | Freq: Once | RECTAL | Status: DC

## 2017-08-11 MED ORDER — GABAPENTIN 400 MG PO CAPS
400.0000 mg | ORAL_CAPSULE | Freq: Three times a day (TID) | ORAL | Status: DC
  Administered 2017-08-16: 400 mg via ORAL
  Filled 2017-08-11 (×10): qty 1

## 2017-08-11 MED ORDER — LORAZEPAM 2 MG/ML IJ SOLN: 2.0000 mg | Freq: Three times a day (TID) | INTRAMUSCULAR | Status: DC | PRN

## 2017-08-11 MED ORDER — TRACE MINERALS CR-CU-MN-SE-ZN 10-1000-500-60 MCG/ML IV SOLN
INTRAVENOUS | Status: AC
  Administered 2017-08-11: 19:00:00 via INTRAVENOUS
  Filled 2017-08-11: qty 1992

## 2017-08-11 MED ORDER — FAT EMULSION 20 % IV EMUL
250.0000 mL | INTRAVENOUS | Status: AC
  Administered 2017-08-11: 250 mL via INTRAVENOUS
  Filled 2017-08-11: qty 250

## 2017-08-11 MED ORDER — OXYCODONE-ACETAMINOPHEN 5-325 MG PO TABS
1.0000 | ORAL_TABLET | ORAL | Status: DC | PRN
  Administered 2017-08-11: 1 via ORAL
  Administered 2017-08-11: 2 via ORAL
  Administered 2017-08-11: 1 via ORAL
  Administered 2017-08-11 – 2017-08-14 (×9): 2 via ORAL
  Administered 2017-08-14: 1 via ORAL
  Administered 2017-08-14 – 2017-08-15 (×3): 2 via ORAL
  Filled 2017-08-11: qty 2
  Filled 2017-08-11: qty 1
  Filled 2017-08-11 (×8): qty 2
  Filled 2017-08-11: qty 1
  Filled 2017-08-11 (×4): qty 2
  Filled 2017-08-11: qty 1
  Filled 2017-08-11: qty 2

## 2017-08-11 NOTE — Plan of Care (Signed)
Problem: Pain Managment: Goal: General experience of comfort will improve Outcome: Progressing Pt continues to complain of 10/10 pain. Medicated with scheduled toradol. Pt asleep on reassessment.

## 2017-08-11 NOTE — Progress Notes (Addendum)
ANTICOAGULATION CONSULT NOTE - Initial Consult  Pharmacy Consult for Heparin  Indication: ischemic bowel s/p stent   No Known Allergies  Patient Measurements: Height: 5\' 2"  (157.5 cm) Weight: 214 lb 3.2 oz (97.2 kg) IBW/kg (Calculated) : 50.1 Heparin Dosing Weight: 73  Vital Signs: Temp: 98.3 F (36.8 C) (10/20 0410) Temp Source: Oral (10/20 0410) BP: 105/51 (10/20 0410) Pulse Rate: 91 (10/20 0410)  Labs:  Recent Labs  08/09/17 1013 08/09/17 1331  08/10/17 0402 08/10/17 1012 08/11/17 0452  HGB 11.5*  --   --  11.3*  --  10.1*  HCT 35.0  --   --  32.7*  --  30.3*  PLT 255  --   --  239  --  232  APTT  --  32  --   --   --   --   HEPARINUNFRC  --   --   < > 0.60 0.51 0.43  CREATININE 0.92  --   --  0.84  --  1.00  < > = values in this interval not displayed.  Estimated Creatinine Clearance: 67.5 mL/min (by C-G formula based on SCr of 1 mg/dL).   Medical History: Past Medical History:  Diagnosis Date  . Abdominal aortic atherosclerosis ([place])    a. 05/2017 CTA abd/pelvis: significant atherosclerotic dzs of the infrarenal abd Ao w/ some mural thrombus. No aneurysm or dissection.  . Baker's cyst of knee, right    a. 07/2016 U/S: 4.1 x 1.4 x 2.9 cystic structure in R poplitetal fossa.  . Bell's palsy   . Cancer ([place])   . Chest pain    a. 08/2012 Lexiscan MV: EF 54%, non ischemia/infarct.  . Diastolic dysfunction    a. 05/2017 Echo: Ef 60-65%, no rwma, Gr1 DD, no source of cardiac emboli.  . 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 05/2017 CTA Abd/pelvis: apparent thrombus or embolus in prox SMA (70-90%); b. 05/2017 catheter directed tPA, mechanical thrombectomy, and stenting of the SMA.  [name] GERD (gastroesophageal reflux disease)   . Hyperlipidemia   . Hypertension   . Morbid obesity with BMI of 40.0-44.9, adult (HCC)     Medications:  Prescriptions Prior to Admission  Medication Sig Dispense Refill Last Dose  . apixaban (ELIQUIS) 5 MG TABS tablet Take 1  tablet (5 mg total) by mouth 2 (two) times daily. 60 tablet 5 08/06/2017 at 0800  . atorvastatin (LIPITOR) 80 MG tablet Take 80 mg by mouth at bedtime.   08/05/2017 at 2000  . cephALEXin (KEFLEX) 500 MG capsule Take 1 capsule (500 mg total) by mouth every 12 (twelve) hours. 10 capsule 0 08/06/2017 at 0800  . esomeprazole (NEXIUM) 40 MG capsule Take 40 mg by mouth 2 (two) times daily before a meal.    08/06/2017 at 0800  . gabapentin (NEURONTIN) 300 MG capsule Take 300 mg by mouth 2 (two) times daily.   08/06/2017 at 0800  . lisinopril (PRINIVIL,ZESTRIL) 40 MG tablet Take 40 mg by mouth daily.   08/06/2017 at 0800  . metoprolol tartrate (LOPRESSOR) 50 MG tablet Take 50 mg by mouth 2 (two) times daily.   08/06/2017 at 0800  . senna-docusate (SENOKOT-S) 8.6-50 MG tablet Take 2 tablets by mouth 2 (two) times daily.   08/06/2017 at 0800  . Vitamin D, Ergocalciferol, (DRISDOL) 50000 units CAPS capsule Take 1 capsule by mouth once a week.   Past Week at 0800  . acetaminophen (TYLENOL) 325 MG tablet Take 2 tablets (650 mg total)  by mouth every 6 (six) hours as needed for mild pain (or Fever >/= 101).   prn at prn  . ALPRAZolam (XANAX) 0.5 MG tablet Take 1 tablet (0.5 mg total) by mouth 2 (two) times daily as needed for anxiety. (Patient not taking: Reported on 07/29/2017) 10 tablet 0 Not Taking at Unknown time  . nicotine (NICODERM CQ - DOSED IN MG/24 HOURS) 14 mg/24hr patch Place 1 patch (14 mg total) onto the skin daily. (Patient not taking: Reported on 08/06/2017) 28 patch 0 Not Taking at Unknown time  . oxybutynin (DITROPAN) 5 MG tablet Take 1 tablet by mouth 3 (three) times daily as needed.   prn at prn  . polyethylene glycol (MIRALAX / GLYCOLAX) packet Take 17 g by mouth daily. (Patient not taking: Reported on 08/06/2017) 14 each 0 Not Taking at Unknown time  . traMADol (ULTRAM) 50 MG tablet Take 50 mg by mouth every 6 (six) hours as needed for moderate pain.    prn at prn   Scheduled:  . gabapentin   300 mg Oral BID  . insulin aspart  0-15 Units Subcutaneous Q4H  . ketorolac  15 mg Intravenous Q6H  . lisinopril  20 mg Oral Daily  . metoprolol tartrate  5 mg Intravenous Q6H  . metoprolol tartrate  50 mg Oral BID  . nicotine  14 mg Transdermal Daily  . ondansetron (ZOFRAN) IV  4 mg Intravenous Q6H  . pantoprazole (PROTONIX) IV  40 mg Intravenous BID  . sodium chloride flush  10-40 mL Intracatheter Q12H    Assessment: Pharmacy consulted to dose and monitor heparin in this 56 year old female being treated for ischemic bowel status post stent therapy;.  10/18 21:00 HL resulted at 0.79 10/19 @ 0400 = 0.6           @ 1012 = 0.41 10/20 @ 0500 :  HL = 0.43  Goal of Therapy:  Heparin level 0.3-0.7 units/ml Monitor platelets by anticoagulation protocol: Yes   Plan:  Will decrease drip rate to 900 units/hr and check next Heparin level in 6hr.   10/20:  HL @ 0500 = 0.43  .   Will continue this pt on current rate and recheck HL on 10/21 @ 0500.    Lydie Stammen D 08/11/17 6:07 AM

## 2017-08-11 NOTE — Progress Notes (Signed)
POD # 3 Overall doing ok Main issues is mobility and pain ( chronic) AVSS NO BM  ? Flatus Hb and creat stable She has been non complaint and apparently had a Milk shake despite being NPO  PE NAD Abd: incision c/d/i. No infection + BS  A/P Resolving ileus DC NGT Keep npo  Decrease narcotic use Mobilize D/w nursing staff in detail D/w pt in detail about importance of being reliable and following rules

## 2017-08-11 NOTE — Plan of Care (Signed)
Problem: Pain Managment: Goal: General experience of comfort will improve Outcome: Not Progressing Pain is not well controlled.

## 2017-08-11 NOTE — Progress Notes (Signed)
PHARMACY - ADULT TOTAL PARENTERAL NUTRITION CONSULT NOTE   Pharmacy Consult for TPN Indication: small bowel obstruction  Patient Measurements: Height: 5\' 2"  (157.5 cm) Weight: 220 lb 8 oz (100 kg) IBW/kg (Calculated) : 50.1 TPN AdjBW (KG): 62.6 Body mass index is 40.33 kg/m. Usual Weight:   Assessment:  Goals: Kcal:  1700-2000kcal/day   Protein:  100-110g/day   Fluid:  >1.6L/day   GI:  Endo:  Insulin requirements in the past 24 hours: 11 Lytes: all within normal limits  Renal: Pulm: Cards:  Hepatobil: Neuro: ID:  Best Practices: TPN Access: TPN start date:  Nutritional Goals (per RD recommendation on ): kCal: Protein:   Current Nutrition:   Plan:   Continue Clinimix E5/15 at 83 ml/hr with 10 ml MVI, 1 ml of Trace element and 100 mg Thiamine   20% lipid emulsion at 95ml/hr Will recheck electrolytes in am.   10/20 :   Ca = 8.2 ,   Albumin = 2.6,  Corrected Ca = 9.32.  All electrolytes within normal limits.    Carla [name] D 08/11/2017,6:17 AM

## 2017-08-11 NOTE — Plan of Care (Signed)
Problem: Activity: Goal: Risk for activity intolerance will decrease Outcome: Progressing Patient ambulated to the chair and then to the bedside commode. Tolerated fair but in a lot of pain.

## 2017-08-12 LAB — COMPREHENSIVE METABOLIC PANEL
ALT: 12 U/L — ABNORMAL LOW (ref 14–54)
AST: 13 U/L — ABNORMAL LOW (ref 15–41)
Albumin: 2.1 g/dL — ABNORMAL LOW (ref 3.5–5.0)
Alkaline Phosphatase: 117 U/L (ref 38–126)
Anion gap: 9 (ref 5–15)
BUN: 25 mg/dL — ABNORMAL HIGH (ref 6–20)
CO2: 22 mmol/L (ref 22–32)
Calcium: 8.3 mg/dL — ABNORMAL LOW (ref 8.9–10.3)
Chloride: 106 mmol/L (ref 101–111)
Creatinine, Ser: 0.96 mg/dL (ref 0.44–1.00)
GFR calc Af Amer: >60 mL/min (ref >60)
GFR calc non Af Amer: >60 mL/min (ref >60)
Glucose, Bld: 127 mg/dL — ABNORMAL HIGH (ref 65–99)
Potassium: 3.6 mmol/L (ref 3.5–5.1)
Sodium: 137 mmol/L (ref 135–145)
Total Bilirubin: 0.4 mg/dL (ref 0.3–1.2)
Total Protein: 5.6 g/dL — ABNORMAL LOW (ref 6.5–8.1)

## 2017-08-12 LAB — HEPARIN LEVEL (UNFRACTIONATED): Heparin Unfractionated: 0.32 IU/mL (ref 0.30–0.70)

## 2017-08-12 LAB — GLUCOSE, CAPILLARY
Glucose-Capillary: 104 mg/dL — ABNORMAL HIGH (ref 65–99)
Glucose-Capillary: 119 mg/dL — ABNORMAL HIGH (ref 65–99)
Glucose-Capillary: 124 mg/dL — ABNORMAL HIGH (ref 65–99)
Glucose-Capillary: 135 mg/dL — ABNORMAL HIGH (ref 65–99)
Glucose-Capillary: 139 mg/dL — ABNORMAL HIGH (ref 65–99)
Glucose-Capillary: 154 mg/dL — ABNORMAL HIGH (ref 65–99)

## 2017-08-12 MED ORDER — APIXABAN 5 MG PO TABS
5.0000 mg | ORAL_TABLET | Freq: Two times a day (BID) | ORAL | Status: DC
  Administered 2017-08-12 – 2017-08-17 (×10): 5 mg via ORAL
  Filled 2017-08-12 (×11): qty 1

## 2017-08-12 MED ORDER — MAGNESIUM CITRATE PO SOLN
1.0000 | Freq: Once | ORAL | Status: AC
Start: 2017-08-12 — End: 2017-08-12
  Administered 2017-08-12: 1 via ORAL
  Filled 2017-08-12: qty 296

## 2017-08-12 MED ORDER — FLEET ENEMA 7-19 GM/118ML RE ENEM
1.0000 | ENEMA | Freq: Once | RECTAL | Status: AC
  Administered 2017-08-12: 1 via RECTAL

## 2017-08-12 MED ORDER — KETOROLAC TROMETHAMINE 15 MG/ML IJ SOLN
30.0000 mg | Freq: Four times a day (QID) | INTRAMUSCULAR | Status: AC
  Administered 2017-08-12 – 2017-08-15 (×12): 30 mg via INTRAVENOUS
  Filled 2017-08-12 (×12): qty 2

## 2017-08-12 MED ORDER — TRACE MINERALS CR-CU-MN-SE-ZN 10-1000-500-60 MCG/ML IV SOLN
INTRAVENOUS | Status: AC
  Administered 2017-08-12: 18:00:00 via INTRAVENOUS
  Filled 2017-08-12: qty 1992

## 2017-08-12 MED ORDER — POLYETHYLENE GLYCOL 3350 17 G PO PACK
17.0000 g | PACK | Freq: Two times a day (BID) | ORAL | Status: DC
  Administered 2017-08-12 – 2017-08-14 (×6): 17 g via ORAL
  Filled 2017-08-12 (×9): qty 1

## 2017-08-12 MED ORDER — FAT EMULSION 20 % IV EMUL
250.0000 mL | INTRAVENOUS | Status: AC
  Administered 2017-08-12: 250 mL via INTRAVENOUS
  Filled 2017-08-12: qty 250

## 2017-08-12 NOTE — Progress Notes (Signed)
Ambulated twice today. Poor tolerance with ambulation. Enema, magnesium citrate and miralax given with no result at this time. Continue to monitor.

## 2017-08-12 NOTE — Progress Notes (Signed)
ANTICOAGULATION CONSULT NOTE - Initial Consult  Pharmacy Consult for Heparin  Indication: ischemic bowel s/p stent   No Known Allergies  Patient Measurements: Height: 5\' 2"  (157.5 cm) Weight: 220 lb 8 oz (100 kg) IBW/kg (Calculated) : 50.1 Heparin Dosing Weight: 73  Vital Signs: Temp: 99.4 F (37.4 C) (10/21 0515) Temp Source: Oral (10/21 0515) BP: 130/63 (10/21 0515) Pulse Rate: 104 (10/21 0515)  Labs:  Recent Labs  08/09/17 1013 08/09/17 1331  08/10/17 0402 08/10/17 1012 08/11/17 0452 08/12/17 0409  HGB 11.5*  --   --  11.3*  --  10.1*  --   HCT 35.0  --   --  32.7*  --  30.3*  --   PLT 255  --   --  239  --  232  --   APTT  --  32  --   --   --   --   --   HEPARINUNFRC  --   --   < > 0.60 0.51 0.43 0.32  CREATININE 0.92  --   --  0.84  --  1.00 0.96  < > = values in this interval not displayed.  Estimated Creatinine Clearance: 71.6 mL/min (by C-G formula based on SCr of 0.96 mg/dL).   Medical History: Past Medical History:  Diagnosis Date  . Abdominal aortic atherosclerosis ([place])    a. 05/2017 CTA abd/pelvis: significant atherosclerotic dzs of the infrarenal abd Ao w/ some mural thrombus. No aneurysm or dissection.  . Baker's cyst of knee, right    a. 07/2016 U/S: 4.1 x 1.4 x 2.9 cystic structure in R poplitetal fossa.  . Bell's palsy   . Cancer ([place])   . Chest pain    a. 08/2012 Lexiscan MV: EF 54%, non ischemia/infarct.  . Diastolic dysfunction    a. 05/2017 Echo: Ef 60-65%, no rwma, Gr1 DD, no source of cardiac emboli.  . 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 05/2017 CTA Abd/pelvis: apparent thrombus or embolus in prox SMA (70-90%); b. 05/2017 catheter directed tPA, mechanical thrombectomy, and stenting of the SMA.  [name] GERD (gastroesophageal reflux disease)   . Hyperlipidemia   . Hypertension   . Morbid obesity with BMI of 40.0-44.9, adult (HCC)     Medications:  Prescriptions Prior to Admission  Medication Sig Dispense Refill Last Dose  .  apixaban (ELIQUIS) 5 MG TABS tablet Take 1 tablet (5 mg total) by mouth 2 (two) times daily. 60 tablet 5 08/06/2017 at 0800  . atorvastatin (LIPITOR) 80 MG tablet Take 80 mg by mouth at bedtime.   08/05/2017 at 2000  . cephALEXin (KEFLEX) 500 MG capsule Take 1 capsule (500 mg total) by mouth every 12 (twelve) hours. 10 capsule 0 08/06/2017 at 0800  . esomeprazole (NEXIUM) 40 MG capsule Take 40 mg by mouth 2 (two) times daily before a meal.    08/06/2017 at 0800  . gabapentin (NEURONTIN) 300 MG capsule Take 300 mg by mouth 2 (two) times daily.   08/06/2017 at 0800  . lisinopril (PRINIVIL,ZESTRIL) 40 MG tablet Take 40 mg by mouth daily.   08/06/2017 at 0800  . metoprolol tartrate (LOPRESSOR) 50 MG tablet Take 50 mg by mouth 2 (two) times daily.   08/06/2017 at 0800  . senna-docusate (SENOKOT-S) 8.6-50 MG tablet Take 2 tablets by mouth 2 (two) times daily.   08/06/2017 at 0800  . Vitamin D, Ergocalciferol, (DRISDOL) 50000 units CAPS capsule Take 1 capsule by mouth once a week.   Past  Week at 0800  . acetaminophen (TYLENOL) 325 MG tablet Take 2 tablets (650 mg total) by mouth every 6 (six) hours as needed for mild pain (or Fever >/= 101).   prn at prn  . ALPRAZolam (XANAX) 0.5 MG tablet Take 1 tablet (0.5 mg total) by mouth 2 (two) times daily as needed for anxiety. (Patient not taking: Reported on 07/29/2017) 10 tablet 0 Not Taking at Unknown time  . nicotine (NICODERM CQ - DOSED IN MG/24 HOURS) 14 mg/24hr patch Place 1 patch (14 mg total) onto the skin daily. (Patient not taking: Reported on 08/06/2017) 28 patch 0 Not Taking at Unknown time  . oxybutynin (DITROPAN) 5 MG tablet Take 1 tablet by mouth 3 (three) times daily as needed.   prn at prn  . polyethylene glycol (MIRALAX / GLYCOLAX) packet Take 17 g by mouth daily. (Patient not taking: Reported on 08/06/2017) 14 each 0 Not Taking at Unknown time  . traMADol (ULTRAM) 50 MG tablet Take 50 mg by mouth every 6 (six) hours as needed for moderate pain.     prn at prn   Scheduled:  . gabapentin  400 mg Oral TID  . insulin aspart  0-15 Units Subcutaneous Q4H  . ketorolac  15 mg Intravenous Q6H  . metoCLOPramide (REGLAN) injection  5 mg Intravenous Q6H  . metoprolol tartrate  5 mg Intravenous Q6H  . metoprolol tartrate  50 mg Oral BID  . nicotine  14 mg Transdermal Daily  . ondansetron (ZOFRAN) IV  4 mg Intravenous Q6H  . pantoprazole (PROTONIX) IV  40 mg Intravenous BID  . sodium chloride flush  10-40 mL Intracatheter Q12H  . sodium phosphate  1 enema Rectal Once    Assessment: Pharmacy consulted to dose and monitor heparin in this 56 year old female being treated for ischemic bowel status post stent therapy;.  10/18 21:00 HL resulted at 0.79 10/19 @ 0400 = 0.6           @ 1012 = 0.41 10/20 @ 0500 :  HL = 0.43  Goal of Therapy:  Heparin level 0.3-0.7 units/ml Monitor platelets by anticoagulation protocol: Yes   Plan:  Will decrease drip rate to 900 units/hr and check next Heparin level in 6hr.   10/20:  HL @ 0500 = 0.43  .   Will continue this pt on current rate and recheck HL on 10/21 @ 0500.   10/21:  HL @ 0400 = 0.32.  Will continue this pt on current rate and recheck HL on 10/22 @ 0500.    Carla [name] D 08/12/17 5:19 AM

## 2017-08-12 NOTE — Progress Notes (Signed)
Patient states at 2300 "I need to be changed I have peed". Explained to patient to call for help to get up to void and not void in the bed.. States" I don't want to get up". Encouraged patient and assisted to [facility] and explained we will put her on q 2 hour up to [facility] schedule. Patient crying. Denies being incontinent at home.

## 2017-08-12 NOTE — Progress Notes (Addendum)
POD # 4 Slowly improving Chronic pain and anxiety are the min issue as the pt decline to ambulate AVSS Taking clears NO BMs Path reviewed and d/w pt  PE NAD Abd: incision c/d/i, + bs, no infection   A/P Resolving ileus Advance diet Dc heparin drip and start eliquis She needs to ambulate daily d/w pt in detail PT needs to see her daily. D/W Nursing staff in detail.

## 2017-08-12 NOTE — Plan of Care (Signed)
Problem: Bowel/Gastric: Goal: Gastrointestinal status for postoperative course will improve Outcome: Not Progressing Has not moved bowels at this time

## 2017-08-12 NOTE — Progress Notes (Signed)
PHARMACY - ADULT TOTAL PARENTERAL NUTRITION CONSULT NOTE   Pharmacy Consult for TPN Indication: small bowel obstruction  Patient Measurements: Height: 5\' 2"  (157.5 cm) Weight: 223 lb (101.2 kg) IBW/kg (Calculated) : 50.1 TPN AdjBW (KG): 62.6 Body mass index is 40.79 kg/m. Usual Weight:   Assessment:  Goals: Kcal:  1700-2000kcal/day   Protein:  100-110g/day   Fluid:  >1.6L/day   GI:  Endo:  Insulin requirements in the past 24 hours: 11 Lytes: all within normal limits  Renal: Pulm: Cards:  Hepatobil: Neuro: ID:  Best Practices: TPN Access: TPN start date:  Nutritional Goals (per RD recommendation on ): kCal: Protein:   Current Nutrition:   Plan:   Continue Clinimix E5/15 at 83 ml/hr with 10 ml MVI, 1 ml of Trace element and 100 mg Thiamine   20% lipid emulsion at 64ml/hr Will recheck electrolytes in am.   Kynslie Ringle K 08/12/2017,2:12 PM

## 2017-08-13 LAB — DIFFERENTIAL
Basophils Absolute: 0.1 10*3/uL (ref 0–0.1)
Basophils Relative: 1 %
Eosinophils Absolute: 0.1 10*3/uL (ref 0–0.7)
Eosinophils Relative: 1 %
Lymphocytes Relative: 16 %
Lymphs Abs: 1.4 10*3/uL (ref 1.0–3.6)
Monocytes Absolute: 0.6 10*3/uL (ref 0.2–0.9)
Monocytes Relative: 7 %
Neutro Abs: 6.9 10*3/uL — ABNORMAL HIGH (ref 1.4–6.5)
Neutrophils Relative %: 75 %

## 2017-08-13 LAB — COMPREHENSIVE METABOLIC PANEL
ALT: 15 U/L (ref 14–54)
AST: 18 U/L (ref 15–41)
Albumin: 2 g/dL — ABNORMAL LOW (ref 3.5–5.0)
Alkaline Phosphatase: 163 U/L — ABNORMAL HIGH (ref 38–126)
Anion gap: 11 (ref 5–15)
BUN: 25 mg/dL — ABNORMAL HIGH (ref 6–20)
CO2: 25 mmol/L (ref 22–32)
Calcium: 8.5 mg/dL — ABNORMAL LOW (ref 8.9–10.3)
Chloride: 102 mmol/L (ref 101–111)
Creatinine, Ser: 0.95 mg/dL (ref 0.44–1.00)
GFR calc Af Amer: >60 mL/min (ref >60)
GFR calc non Af Amer: >60 mL/min (ref >60)
Glucose, Bld: 162 mg/dL — ABNORMAL HIGH (ref 65–99)
Potassium: 3.8 mmol/L (ref 3.5–5.1)
Sodium: 138 mmol/L (ref 135–145)
Total Bilirubin: 0.6 mg/dL (ref 0.3–1.2)
Total Protein: 5.8 g/dL — ABNORMAL LOW (ref 6.5–8.1)

## 2017-08-13 LAB — CBC
HCT: 29.3 % — ABNORMAL LOW (ref 35.0–47.0)
Hemoglobin: 9.5 g/dL — ABNORMAL LOW (ref 12.0–16.0)
MCH: 28.7 pg (ref 26.0–34.0)
MCHC: 32.5 g/dL (ref 32.0–36.0)
MCV: 88.3 fL (ref 80.0–100.0)
Platelets: 289 10*3/uL (ref 150–440)
RBC: 3.32 MIL/uL — ABNORMAL LOW (ref 3.80–5.20)
RDW: 15 % — ABNORMAL HIGH (ref 11.5–14.5)
WBC: 9 10*3/uL (ref 3.6–11.0)

## 2017-08-13 LAB — GLUCOSE, CAPILLARY
Glucose-Capillary: 104 mg/dL — ABNORMAL HIGH (ref 65–99)
Glucose-Capillary: 135 mg/dL — ABNORMAL HIGH (ref 65–99)
Glucose-Capillary: 137 mg/dL — ABNORMAL HIGH (ref 65–99)
Glucose-Capillary: 144 mg/dL — ABNORMAL HIGH (ref 65–99)
Glucose-Capillary: 145 mg/dL — ABNORMAL HIGH (ref 65–99)
Glucose-Capillary: 153 mg/dL — ABNORMAL HIGH (ref 65–99)

## 2017-08-13 LAB — PHOSPHORUS: Phosphorus: 4.9 mg/dL — ABNORMAL HIGH (ref 2.5–4.6)

## 2017-08-13 LAB — TRIGLYCERIDES: Triglycerides: 167 mg/dL — ABNORMAL HIGH (ref <150)

## 2017-08-13 LAB — PREALBUMIN: Prealbumin: 10.7 mg/dL — ABNORMAL LOW (ref 18–38)

## 2017-08-13 LAB — MAGNESIUM: Magnesium: 2.2 mg/dL (ref 1.7–2.4)

## 2017-08-13 MED ORDER — TRACE MINERALS CR-CU-MN-SE-ZN 10-1000-500-60 MCG/ML IV SOLN: INTRAVENOUS | Status: DC

## 2017-08-13 MED ORDER — NICOTINE 7 MG/24HR TD PT24
7.0000 mg | MEDICATED_PATCH | Freq: Every day | TRANSDERMAL | Status: DC
Start: 2017-08-14 — End: 2017-08-17
  Filled 2017-08-13 (×4): qty 1

## 2017-08-13 MED ORDER — FAT EMULSION 20 % IV EMUL
250.0000 mL | INTRAVENOUS | Status: AC
  Administered 2017-08-13: 250 mL via INTRAVENOUS
  Filled 2017-08-13: qty 250

## 2017-08-13 MED ORDER — LACTULOSE 10 GM/15ML PO SOLN: 30.0000 g | Freq: Every day | ORAL | Status: DC | PRN

## 2017-08-13 MED ORDER — GLUCERNA SHAKE PO LIQD
237.0000 mL | Freq: Three times a day (TID) | ORAL | Status: DC
  Administered 2017-08-13: 237 mL via ORAL

## 2017-08-13 MED ORDER — SENNOSIDES-DOCUSATE SODIUM 8.6-50 MG PO TABS
1.0000 | ORAL_TABLET | Freq: Every day | ORAL | Status: DC
  Administered 2017-08-13 – 2017-08-16 (×3): 1 via ORAL
  Filled 2017-08-13 (×3): qty 1

## 2017-08-13 MED ORDER — PANTOPRAZOLE SODIUM 40 MG PO TBEC: 40.0000 mg | DELAYED_RELEASE_TABLET | Freq: Every day | ORAL | Status: DC

## 2017-08-13 MED ORDER — TRACE MINERALS CR-CU-MN-SE-ZN 10-1000-500-60 MCG/ML IV SOLN
INTRAVENOUS | Status: AC
  Administered 2017-08-13: 18:00:00 via INTRAVENOUS
  Filled 2017-08-13: qty 1560

## 2017-08-13 NOTE — Progress Notes (Signed)
[place] [facility] Day(s): 7.   Post op day(s): 5 Days Post-Op.   Interval History: Patient seen and examined, no acute events or new complaints overnight, though patient continues to report more nausea than abdominal pain with +flatus, no BM, denies emesis, fever/chills, CP, or SOB and states she has ambulated in the halls once yesterday. She also describes she has been eating and drinking very little despite having been ordered a regular diet with TPN ongoing.  Review of Systems:  Constitutional: denies fever, chills  HEENT: denies cough or congestion  Respiratory: denies any shortness of breath  Cardiovascular: denies chest pain or palpitations  Gastrointestinal: abdominal pain, N/V, and bowel function as per interval history Genitourinary: denies burning with urination or urinary frequency Musculoskeletal: denies pain, decreased motor or sensation Integumentary: denies any other rashes or skin discolorations except post-surgical abdominal wound Neurological: denies HA or vision/hearing changes   Vital signs in last 24 hours: [min-max] current  Temp:  [97.3 F (36.3 C)-99.1 F (37.3 C)] 98.5 F (36.9 C) (10/22 0745) Pulse Rate:  [79-105] 92 (10/22 0745) Resp:  [18-20] 18 (10/22 0745) BP: (118-158)/(59-81) 158/74 (10/22 0745) SpO2:  [93 %-100 %] 96 % (10/22 0745) Weight:  [221 lb (100.2 kg)] 221 lb (100.2 kg) (10/22 0500)     Height: 5\' 2"  (157.5 cm) Weight: 221 lb (100.2 kg) BMI (Calculated): 40.41   Intake/Output this shift:  No intake/output data recorded.   Intake/Output last 2 shifts:  @IOLAST2SHIFTS @   Physical Exam:  Constitutional: alert, cooperative and no distress  HENT: normocephalic without obvious abnormality  Eyes: PERRL, EOM's grossly intact and symmetric  Neuro: CN II - XII grossly intact and symmetric without deficit  Respiratory: breathing non-labored at rest  Cardiovascular: regular rate and sinus rhythm  Gastrointestinal: soft, obese,  and not visibly distended with mild peri-incisional tenderness to palpation, incision well-approximated with mild incisional serosanguinous drainage, but no peri-incisional erythema Musculoskeletal: UE and LE FROM, motor and sensation grossly intact, NT   Labs:  CBC Latest Ref Rng & Units 08/13/2017 08/11/2017 08/10/2017  WBC 3.6 - 11.0 K/uL 9.0 9.5 11.0  Hemoglobin 12.0 - 16.0 g/dL 9.5(L) 10.1(L) 11.3(L)  Hematocrit 35.0 - 47.0 % 29.3(L) 30.3(L) 32.7(L)  Platelets 150 - 440 K/uL 289 232 239   CMP Latest Ref Rng & Units 08/13/2017 08/12/2017 08/11/2017  Glucose 65 - 99 mg/dL 162(H) 127(H) 118(H)  BUN 6 - 20 mg/dL 25(H) 25(H) 20  Creatinine 0.44 - 1.00 mg/dL 0.95 0.96 1.00  Sodium 135 - 145 mmol/L 138 137 136  Potassium 3.5 - 5.1 mmol/L 3.8 3.6 3.9  Chloride 101 - 111 mmol/L 102 106 106  CO2 22 - 32 mmol/L 25 22 25   Calcium 8.9 - 10.3 mg/dL 8.5(L) 8.3(L) 8.2(L)  Total Protein 6.5 - 8.1 g/dL 5.8(L) 5.6(L) -  Total Bilirubin 0.3 - 1.2 mg/dL 0.6 0.4 -  Alkaline Phos 38 - 126 U/L 163(H) 117 -  AST 15 - 41 U/L 18 13(L) -  ALT 14 - 54 U/L 15 12(L) -   Imaging studies: No new pertinent imaging studies   Assessment/Plan: (ICD-10's: K33.52) 56 y.o. female with slowly resolving post-operative ileus and constipation 5 Days Post-Op s/p exploratory laparotomy with lysis of adhesions and small bowel resection with primary anastomosis for SBO attributed to focal enteritis/thickening/scar tissue at site of recent small bowel (ileum) ischemia secondary to SMA thromboembolus with aortic thrombus (not intracardiac thrombus on echo) s/p SMA stent placement, complicated by chronic severe constipation and  other pertinent comorbidities including obesity (BMI 40), HTN, HLD, CAD with diastolic CHF, aortic thrombus, Bell's Palsy, and cancer not otherwise specified.   - pain control prn, minimize narcotics  - soft diet with Glucerna oral supplement shakes  - decrease TPN to 2/3 rate per discussion with  dietition  - continue to monitor abdominal exam and bowel function  - agree with continue oral Eliquis anticoagulation  - medical management of comorbidities  - ambulation strongly encouraged  All of the above findings and recommendations were discussed with the patient and patient's family, and all of patient's and family's questions were answered to their expressed satisfaction.  -- Marilynne Drivers Rosana Hoes, MD, [place]: [facility] - Partnering for exceptional care. Office: [phone]

## 2017-08-13 NOTE — Progress Notes (Signed)
Nutrition Follow Up Note  DOCUMENTATION CODES:   Severe malnutrition in context of acute illness/injury  INTERVENTION:   Recommend decrease Clinimix 5/15 with electrolytes to 72ml/hr  Continue 20% lipids @20ml /hr x 12 hrs/day  Regimen provides 1588kcal/day (93% estimated needs), 78g/day protein (78% estimated needs), 1858ml volume  Recommend check phosphorus 10/23.   Continue MVI daily   Discontinue IV thiamine 100mg  daily   Continue daily weights  NUTRITION DIAGNOSIS:   Malnutrition (severe) related to acute illness (SBO) as evidenced by energy intake < or equal to 50% for > or equal to 5 days, 5 percent weight loss in 1 week.  GOAL:   Patient will meet greater than or equal to 90% of their needs   -meeting with TPN  MONITOR:   Labs, Weight trends, I & O's, Other (Comment) (TPN)  ASSESSMENT:   56 y.o. 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 CT scan in the emergency room suggest a bowel obstruction with a transition point in an area where prior ischemia was identified.   PICC Double Lumen 66/29/47 PICC Right Basilic 40 cm 2 cm   Pt tolerating TPN. Pt was noted to have drank milkshake despite NPO. Pt tolerating clears 10/20. Pt advanced to regular diet yesterday; pt tolerated regular diet yesterday but reports nausea today and is not eating. Still no BM. NGT removed 10/20. Per chart, pt with 7lb weight gain since admit. Phosphorus up a little. Recommend decrease TPN rate to 70ml/hr to help with volume status and elevating phosphorus. If pt able to tolerate oral diet without  nausea, can decrease to 1/2 goal rate tomorrow (64ml/hr).    Medications reviewed and include: insulin, reglan, nicotine, zofran, protonix, miralax, oxycodone   Labs reviewed: K 3.8 wnl, BUN 25(H), Ca 8.5(L) adj. 10.1 wnl, alb 2.0(L), P 4.9(H), Mg 2.2 wnl, AlkPhos 163(H) Prealbumin- 19.1- 10/18 triglycerides 167(H) cbgs- 127, 162 x 24 hrs  Diet Order:  TPN (CLINIMIX-E) Adult DIET SOFT Room service appropriate? Yes; Fluid consistency: Thin  Skin:  Reviewed, no issues  Last BM:  10/15- type 7  Height:   Ht Readings from Last 1 Encounters:  08/06/17 5\' 2"  (1.575 m)    Weight:   Wt Readings from Last 1 Encounters:  08/13/17 227 lb 11.2 oz (103.3 kg)    Ideal Body Weight:  50 kg  BMI:  Body mass index is 41.65 kg/m.  Estimated Nutritional Needs:   Kcal:  1700-2000kcal/day   Protein:  100-110g/day   Fluid:  >1.6L/day   EDUCATION NEEDS:   Education needs addressed  Koleen Distance MS, RD, LDN Pager #[phone] After Hours Pager: [phone]

## 2017-08-13 NOTE — Progress Notes (Signed)
PHARMACY - ADULT TOTAL PARENTERAL NUTRITION CONSULT NOTE   Pharmacy Consult for TPN Indication: small bowel obstruction  Patient Measurements: Height: 5\' 2"  (157.5 cm) Weight: 227 lb 11.2 oz (103.3 kg) IBW/kg (Calculated) : 50.1 TPN AdjBW (KG): 62.6 Body mass index is 41.65 kg/m. Usual Weight:   Assessment:   GI:  Endo:  Insulin requirements in the past 24 hours: 9 units Lytes: K 3.8, Mg 2.2, Phos 4.9  Renal: Pulm: Cards:  Hepatobil: Neuro: ID:  Best Practices: TPN Access: TPN start date: August 10 2017   Current Nutrition:  Clinimix E 5/15 at 83 mL/hr with MVI, trace elements, and thiamine 20% lipid emulsion at 20 mL/hr over 12 hours  Plan:  No electrolyte supplementation needed at this time. Will recheck labs tomorrow morning.  Reduce Clinimix E 5/15 to 65 mL/hr. Continue MVI and trace elements per RD recommendations. Will Continue 20% lipid emulsion at 20 mL/hr over 12 hours  Lenis Noon, PharmD, BCPS Clinical Pharmacist 08/13/2017,1:20 PM

## 2017-08-13 NOTE — Plan of Care (Signed)
Problem: Activity: Goal: Ability to tolerate increased activity will improve Outcome: Progressing Patient ambulated in the hallway. One lap around nurses station. 160 ft. Tolerated fair. Complaints of nausea.

## 2017-08-14 LAB — GLUCOSE, CAPILLARY
Glucose-Capillary: 101 mg/dL — ABNORMAL HIGH (ref 65–99)
Glucose-Capillary: 109 mg/dL — ABNORMAL HIGH (ref 65–99)
Glucose-Capillary: 118 mg/dL — ABNORMAL HIGH (ref 65–99)
Glucose-Capillary: 128 mg/dL — ABNORMAL HIGH (ref 65–99)
Glucose-Capillary: 134 mg/dL — ABNORMAL HIGH (ref 65–99)
Glucose-Capillary: 141 mg/dL — ABNORMAL HIGH (ref 65–99)

## 2017-08-14 LAB — MAGNESIUM: Magnesium: 2.7 mg/dL — ABNORMAL HIGH (ref 1.7–2.4)

## 2017-08-14 LAB — POTASSIUM: Potassium: 4.2 mmol/L (ref 3.5–5.1)

## 2017-08-14 LAB — PHOSPHORUS: Phosphorus: 4.9 mg/dL — ABNORMAL HIGH (ref 2.5–4.6)

## 2017-08-14 MED ORDER — ALUM & MAG HYDROXIDE-SIMETH 200-200-20 MG/5ML PO SUSP
15.0000 mL | Freq: Once | ORAL | Status: AC
  Administered 2017-08-14: 15 mL via ORAL
  Filled 2017-08-14: qty 30

## 2017-08-14 MED ORDER — FAT EMULSION 20 % IV EMUL
180.0000 mL | INTRAVENOUS | Status: DC
  Administered 2017-08-14: 180 mL via INTRAVENOUS
  Filled 2017-08-14: qty 180

## 2017-08-14 MED ORDER — PREMIER PROTEIN SHAKE
11.0000 [oz_av] | Freq: Two times a day (BID) | ORAL | Status: DC
  Administered 2017-08-14: 11 [oz_av] via ORAL

## 2017-08-14 MED ORDER — PANTOPRAZOLE SODIUM 40 MG PO TBEC
40.0000 mg | DELAYED_RELEASE_TABLET | Freq: Two times a day (BID) | ORAL | Status: DC
  Administered 2017-08-14 (×2): 40 mg via ORAL
  Filled 2017-08-14 (×3): qty 1

## 2017-08-14 MED ORDER — TRACE MINERALS CR-CU-MN-SE-ZN 10-1000-500-60 MCG/ML IV SOLN
INTRAVENOUS | Status: DC
  Administered 2017-08-14: 18:00:00 via INTRAVENOUS
  Filled 2017-08-14 (×3): qty 984

## 2017-08-14 MED ORDER — ALUM & MAG HYDROXIDE-SIMETH 200-200-20 MG/5ML PO SUSP
15.0000 mL | Freq: Four times a day (QID) | ORAL | Status: DC | PRN
Start: 2017-08-14 — End: 2017-08-17
  Administered 2017-08-14 – 2017-08-15 (×2): 15 mL via ORAL
  Filled 2017-08-14 (×2): qty 30

## 2017-08-14 NOTE — Progress Notes (Signed)
PHARMACY - ADULT TOTAL PARENTERAL NUTRITION CONSULT NOTE   Pharmacy Consult for TPN Indication: small bowel obstruction  Patient Measurements: Height: 5\' 2"  (157.5 cm) Weight: 229 lb 14.4 oz (104.3 kg) IBW/kg (Calculated) : 50.1 TPN AdjBW (KG): 62.6 Body mass index is 42.05 kg/m. Usual Weight:   Assessment:   GI:  Endo: Insulin requirements in the past 24 hours: 11 units Lytes: K 4.2, Mg 2.7, Phos 4.9  Renal:Scr: 0.53m, CrCl: 74.59ml/min Pulm: Cards:  Hepatobil: Neuro: ID:  Best Practices: TPN Access:10/19 TPN start date: August 10 2017   Current Nutrition:  Clinimix E 5/15 at 65 mL/hr with MVI, trace elements, and thiamine 20% lipid emulsion at 20 mL/hr over 12 hours  Plan:  No electrolyte supplementation needed at this time. Will recheck K, Phos and Mag with AM labs.   Change to Clinimix 5/15 without electrolytes and reduce rate to 61ml/hr  Per RD recommendations.    Also Reduce 20% lipid emulsion to 15 mL/hr over 12 hours  Continue with MVI and Trace elements   Regimen provides 1059kcal/day (62% estimated needs), 49g/day protein (49% estimated needs), 1162ml volume  Pernell Dupre, PharmD, BCPS Clinical Pharmacist 08/14/2017,11:03 AM

## 2017-08-14 NOTE — Progress Notes (Addendum)
Pt complaining of severe acid reflux. Requesting that TPN and fat emulsions be stopped. Educated pt on importance of TPN and fat emulsions for adequate nutrition. Offered to give mylanta when due. Notified Dr. Anselm Jungling, verbal order received to stop TPN and fat emulsions for tonight and have pt educated again tomorrow. Encouraged PO intake, lunch box provided.

## 2017-08-14 NOTE — Plan of Care (Signed)
Problem: Skin Integrity: Goal: Risk for impaired skin integrity will decrease Outcome: Progressing Patient is no longer using a brief. Incontinence has resolved. Perineal area clean and dry. Patient has also began to ambulate more.   Problem: Activity: Goal: Risk for activity intolerance will decrease Outcome: Progressing Patient is tolerating activity better than yesterday. She plans to walk a few more laps around the nurses station after lunch.   Problem: Bowel/Gastric: Goal: Will not experience complications related to bowel motility Outcome: Progressing Patient passed a medium amount of mushy stool.

## 2017-08-14 NOTE — Progress Notes (Signed)
Nutrition Follow Up Note  DOCUMENTATION CODES:   Severe malnutrition in context of acute illness/injury  INTERVENTION:   Recommend change to Clinimix 5/15 without electrolytes at 51m/hr  Decrease 20% lipids to 155mhr x 12 hrs/day  Regimen provides 1059kcal/day (62% estimated needs), 49g/day protein (49% estimated needs), 116482molume  Recommend check K, P, and Mg 10/24.   Continue MVI daily   Continue daily weights  Add Premier Protein BID, each supplement provides 160 kcal and 30 grams of protein.   NUTRITION DIAGNOSIS:   Malnutrition (severe) related to acute illness (SBO) as evidenced by energy intake < or equal to 50% for > or equal to 5 days, 5 percent weight loss in 1 week.  GOAL:   Patient will meet greater than or equal to 90% of their needs   -meeting 50-60% of estimated needs with TPN  MONITOR:   Labs, Weight trends, I & O's, Other (Comment) (TPN)  ASSESSMENT:   56 2o. 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 CT scan in the emergency room suggest a bowel obstruction with a transition point in an area where prior ischemia was identified.   PICC Double Lumen 10/40/34/74CC Right Basilic 40 cm 2 cm   Met with pt in room today. Pt reports continued nausea today that she relates to severe acid reflux. Pt reports that she has not eaten anything today but she did eat 75% of her breakfast yesterday. Pt had unopened strawberry Ensure on her bedside table that she states " I will drink it if its chocolate". RD will switch pt to Premier Protein as this has 30g protein. Pt's  weight is up another 2lbs today; pt with 9lb weight gain since admit. Pt is documented to be +13L since admit. Spoke to RN, plan to start acid reducers to help with reflux. RD spoke to pt about the importance of adequate energy and protein intake in order to discontinue TPN. Pt agrees to drink supplements once reflux resolved. Pt reports flatus and a small bowel movement yesterday. Recommend decrease TPN to 1/2 goal rate and remove electrolytes as P and Mg up today; recheck labs tomorrow.         Medications reviewed and include: insulin, reglan, nicotine, zofran, protonix, miralax, oxycodone   Labs reviewed: K 4.2 wnl, P 4.9(H), Mg 2.7(H) Prealbumin- 10.7 Hgb 9.5(L), Hct 29.3(L)- 10/22 triglycerides 167(H)- 10/22 cbgs- 118, 127, 162 x 48 hrs  Diet Order:  DIET SOFT Room service appropriate? Yes; Fluid consistency: Thin .TPN (CLINIMIX-E) Adult  Skin:  Reviewed, no issues  Last BM:  10/15- type 7  Height:   Ht Readings from Last 1 Encounters:  08/06/17 '5\' 2"'$  (1.575 m)    Weight:   Wt Readings from Last 1 Encounters:  08/14/17 229 lb 14.4 oz (104.3 kg)    Ideal Body Weight:  50 kg  BMI:  Body mass index is 42.05 kg/m.  Estimated Nutritional Needs:   Kcal:  1700-2000kcal/day   Protein:  100-110g/day   Fluid:  >1.6L/day   EDUCATION NEEDS:   Education needs addressed  CasKoleen Distance, RD, LDN Pager #- [phone] Hours Pager: [phone]

## 2017-08-14 NOTE — Progress Notes (Signed)
[facility] Day(s): 8.   Post op day(s): 6 Days Post-Op.   Interval History: Patient seen and examined, no acute events or new complaints overnight. Patient complains of severe heartburn, though describes abdominal pain and nausea have improved. She reportedly finished 75% of her large breakfast yesterday morning, but has since continued to have a limited appetite that patient attributes to heartburn. She otherwise reports +flatus and +BM, denies fever/chills, CP, or SOB and ambulated around the nurses' station at least twice yesterday with assist.  Review of Systems:  Constitutional: denies fever, chills  HEENT: denies cough or congestion  Respiratory: denies any shortness of breath  Cardiovascular: denies chest pain or palpitations  Gastrointestinal: abdominal pain, N/V, and bowel function as per interval history Genitourinary: denies burning with urination or urinary frequency Musculoskeletal: denies pain, decreased motor or sensation Integumentary: denies any other rashes or skin discolorations except post-surgical abdominal wound Neurological: denies HA or vision/hearing changes   Vital signs in last 24 hours: [min-max] current  Temp:  [98 F (36.7 C)-98.8 F (37.1 C)] 98 F (36.7 C) (10/23 0409) Pulse Rate:  [85-94] 85 (10/23 0409) Resp:  [16-18] 18 (10/23 0409) BP: (131-158)/(72-80) 131/79 (10/23 0409) SpO2:  [96 %-98 %] 97 % (10/23 0409) Weight:  [227 lb 11.2 oz (103.3 kg)-229 lb 14.4 oz (104.3 kg)] 229 lb 14.4 oz (104.3 kg) (10/23 0409)     Height: 5\' 2"  (157.5 cm) Weight: 229 lb 14.4 oz (104.3 kg) BMI (Calculated): 42.04   Intake/Output this shift:  No intake/output data recorded.   Intake/Output last 2 shifts:  @IOLAST2SHIFTS @   Physical Exam:  Constitutional: alert, cooperative and no distress  HENT: normocephalic without obvious abnormality  Eyes: PERRL, EOM's grossly intact and symmetric  Neuro: CN II - XII grossly intact and symmetric  without deficit  Respiratory: breathing non-labored at rest  Cardiovascular: regular rate and sinus rhythm  Gastrointestinal: soft, obese, and not visibly distended with mild peri-incisional tenderness to palpation, incision well-approximated with mild incisional serosanguinous drainage, but no peri-incisional erythema Musculoskeletal: UE and LE FROM, motor and sensation grossly intact, NT   Labs:  CBC Latest Ref Rng & Units 08/13/2017 08/11/2017 08/10/2017  WBC 3.6 - 11.0 K/uL 9.0 9.5 11.0  Hemoglobin 12.0 - 16.0 g/dL 9.5(L) 10.1(L) 11.3(L)  Hematocrit 35.0 - 47.0 % 29.3(L) 30.3(L) 32.7(L)  Platelets 150 - 440 K/uL 289 232 239   CMP Latest Ref Rng & Units 08/14/2017 08/13/2017 08/12/2017  Glucose 65 - 99 mg/dL - 162(H) 127(H)  BUN 6 - 20 mg/dL - 25(H) 25(H)  Creatinine 0.44 - 1.00 mg/dL - 0.95 0.96  Sodium 135 - 145 mmol/L - 138 137  Potassium 3.5 - 5.1 mmol/L 4.2 3.8 3.6  Chloride 101 - 111 mmol/L - 102 106  CO2 22 - 32 mmol/L - 25 22  Calcium 8.9 - 10.3 mg/dL - 8.5(L) 8.3(L)  Total Protein 6.5 - 8.1 g/dL - 5.8(L) 5.6(L)  Total Bilirubin 0.3 - 1.2 mg/dL - 0.6 0.4  Alkaline Phos 38 - 126 U/L - 163(H) 117  AST 15 - 41 U/L - 18 13(L)  ALT 14 - 54 U/L - 15 12(L)   Imaging studies: No new pertinent imaging studies   Assessment/Plan: (ICD-10's: K41.52) 56 y.o. 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              - pain  control prn, minimize narcotics             - soft diet with Premiere high protein shakes             - decrease TPN to 1/2 rate per discussion with dietition             - continue to monitor abdominal exam and bowel function             - agree with continue oral Eliquis anticoagulation             - medical management of comorbidities             - ambulation strongly encouraged  All of the above findings and recommendations were discussed with the patient and patient's family, and all of patient's and family's questions were answered to their expressed satisfaction.  -- Marilynne Drivers Rosana Hoes, MD, [place]: [facility] - Partnering for exceptional care. Office: [phone]

## 2017-08-14 NOTE — Progress Notes (Signed)
Physical Therapy Treatment Patient Details Name: Carla [name] MRN: [id number] DOB: 1961/01/02 Today's Date: 08/14/2017    History of Present Illness Pt admitted for small bowel obstruction, in August 2018 pt seen for superior mesenteric artery embolism s/p thrombectomy with stent placement. Pt complaints of lower abdominal pain and not passing gas or having a BM since last week. On 10/17 had exploratory laparotomy and possible bowel resection.    PT Comments    Pt is making good progress towards goals. Pt performed transfers/amb with RW with CGA. Pt received in bathroom, assisted with toileting tasks. Pt amb one lap around nurses station, able to maintain steady pace, said hello to staff in hallway, stopped at water fountain for a water break. Pt required significant encouragement throughout session to perform mobility activities, appeared anxious but motivated to participate. Nursing was present during session to provide medications and encouragement. Will continue to progress with mobility.    Follow Up Recommendations  Home health PT     Equipment Recommendations  Rolling walker with 5" wheels    Recommendations for Other Services       Precautions / Restrictions Precautions Precautions: Fall Restrictions Weight Bearing Restrictions: No    Mobility  Bed Mobility               General bed mobility comments: Pt received in bathroom  Transfers Overall transfer level: Needs assistance Equipment used: Rolling walker (2 wheeled) Transfers: Sit to/from Stand Sit to Stand: Min guard         General transfer comment: Pt able to rise from toilet with use of rail, no cues or assist needed, CGA for support  Ambulation/Gait Ambulation/Gait assistance: Min guard Ambulation Distance (Feet): 230 Feet Assistive device: Rolling walker (2 wheeled) Gait Pattern/deviations: Step-through pattern     General Gait Details: Pt amb with alternating step-through gait, able to  maintain steady pace, no LOB or SOB. Pt stopped at water fountain for water break. Pt said hello to staff in hallway.    Stairs            Wheelchair Mobility    Modified Rankin (Stroke Patients Only)       Balance Overall balance assessment: Needs assistance Sitting-balance support: Feet supported Sitting balance-[name]: Fair Sitting balance - Comments: Pt able to sit on toilet, sit on EOB and maintain position to take meds. Supervision.    Standing balance support: Bilateral upper extremity supported Standing balance-[name]: Good Standing balance comment: Relies on RW for UE support, able to drink from water fountain, wash hands at sink, no assist needed.                            Cognition Arousal/Alertness: Awake/alert Behavior During Therapy: Anxious Overall Cognitive Status: Within Functional Limits for tasks assessed                                        Exercises Other Exercises Other Exercises: Assisted pt in bathroom to perform toileting tasks and wash hands at sink    General Comments        Pertinent Vitals/Pain Pain Assessment: 0-10 Pain Score: 10-Worst pain ever Pain Location: stomach Pain Descriptors / Indicators: Sore Pain Intervention(s): Limited activity within patient's tolerance;Monitored during session;Repositioned;Patient requesting pain meds-RN notified    Home Living  Prior Function            PT Goals (current goals can now be found in the care plan section) Acute Rehab PT Goals Patient Stated Goal: to walk 2 laps tomorrow PT Goal Formulation: With patient Potential to Achieve Goals: Good Progress towards PT goals: Progressing toward goals    Frequency    Min 2X/week      PT Plan Current plan remains appropriate    Co-evaluation              AM-PAC PT "6 Clicks" Daily Activity  Outcome Measure  Difficulty turning over in bed (including  adjusting bedclothes, sheets and blankets)?: Unable Difficulty moving from lying on back to sitting on the side of the bed? : Unable Difficulty sitting down on and standing up from a chair with arms (e.g., wheelchair, bedside commode, etc,.)?: Unable Help needed moving to and from a bed to chair (including a wheelchair)?: A Little Help needed walking in hospital room?: A Little Help needed climbing 3-5 steps with a railing? : A Little 6 Click Score: 12    End of Session Equipment Utilized During Treatment: Gait belt Activity Tolerance: Patient tolerated treatment well Patient left: in chair;with call bell/phone within reach;with chair alarm set Nurse Communication: Mobility status PT Visit Diagnosis: Other abnormalities of gait and mobility (R26.89);Muscle weakness (generalized) (M62.81);Pain Pain - part of body:  (stomach)     Time: 3976-7341 PT Time Calculation (min) (ACUTE ONLY): 33 min  Charges:                       G Codes:  Functional Assessment Tool Used: AM-PAC 6 Clicks Basic Mobility Functional Limitation: Mobility: Walking and moving around Mobility: Walking and Moving Around Current Status (P3790): At least 60 percent but less than 80 percent impaired, limited or restricted Mobility: Walking and Moving Around Goal Status [phone]): At least 40 percent but less than 60 percent impaired, limited or restricted   Manfred Arch, SPT   Manfred Arch 08/14/2017, 2:54 PM

## 2017-08-15 ENCOUNTER — Inpatient Hospital Stay: Payer: Medicaid Other

## 2017-08-15 ENCOUNTER — Encounter: Payer: Self-pay | Admitting: Radiology

## 2017-08-15 ENCOUNTER — Other Ambulatory Visit: Payer: Self-pay

## 2017-08-15 DIAGNOSIS — F4322 Adjustment disorder with anxiety: Secondary | ICD-10-CM

## 2017-08-15 LAB — POTASSIUM: Potassium: 4.7 mmol/L (ref 3.5–5.1)

## 2017-08-15 LAB — GLUCOSE, CAPILLARY
Glucose-Capillary: 111 mg/dL — ABNORMAL HIGH (ref 65–99)
Glucose-Capillary: 112 mg/dL — ABNORMAL HIGH (ref 65–99)
Glucose-Capillary: 117 mg/dL — ABNORMAL HIGH (ref 65–99)
Glucose-Capillary: 83 mg/dL (ref 65–99)
Glucose-Capillary: 87 mg/dL (ref 65–99)
Glucose-Capillary: 98 mg/dL (ref 65–99)

## 2017-08-15 LAB — MAGNESIUM: Magnesium: 2.7 mg/dL — ABNORMAL HIGH (ref 1.7–2.4)

## 2017-08-15 LAB — PHOSPHORUS: Phosphorus: 4.3 mg/dL (ref 2.5–4.6)

## 2017-08-15 LAB — TROPONIN I
Troponin I: 0.03 ng/mL (ref <0.03)
Troponin I: 0.04 ng/mL (ref <0.03)

## 2017-08-15 MED ORDER — NON FORMULARY: 20.0000 mg | Freq: Two times a day (BID) | Status: DC

## 2017-08-15 MED ORDER — TRACE MINERALS CR-CU-MN-SE-ZN 10-1000-500-60 MCG/ML IV SOLN
INTRAVENOUS | Status: DC
  Filled 2017-08-15: qty 984

## 2017-08-15 MED ORDER — IOPAMIDOL (ISOVUE-300) INJECTION 61%
15.0000 mL | INTRAVENOUS | Status: DC
  Administered 2017-08-15: 15 mL via ORAL

## 2017-08-15 MED ORDER — FAT EMULSION 20 % IV EMUL
250.0000 mL | INTRAVENOUS | Status: DC
  Filled 2017-08-15: qty 250

## 2017-08-15 MED ORDER — PROMETHAZINE HCL 25 MG/ML IJ SOLN
12.5000 mg | Freq: Four times a day (QID) | INTRAMUSCULAR | Status: DC | PRN
  Administered 2017-08-15 – 2017-08-16 (×5): 12.5 mg via INTRAVENOUS
  Filled 2017-08-15 (×5): qty 1

## 2017-08-15 MED ORDER — LORAZEPAM 2 MG/ML IJ SOLN
0.5000 mg | Freq: Three times a day (TID) | INTRAMUSCULAR | Status: DC
  Administered 2017-08-15 – 2017-08-17 (×5): 0.5 mg via INTRAVENOUS
  Filled 2017-08-15 (×5): qty 1

## 2017-08-15 MED ORDER — OXYCODONE-ACETAMINOPHEN 5-325 MG PO TABS
1.0000 | ORAL_TABLET | ORAL | Status: DC | PRN
  Administered 2017-08-16 – 2017-08-17 (×5): 1 via ORAL
  Filled 2017-08-15 (×5): qty 1

## 2017-08-15 MED ORDER — CALCIUM CARBONATE ANTACID 500 MG PO CHEW: 200.0000 mg | CHEWABLE_TABLET | Freq: Two times a day (BID) | ORAL | Status: DC | PRN

## 2017-08-15 MED ORDER — MORPHINE SULFATE (PF) 2 MG/ML IV SOLN
1.0000 mg | Freq: Once | INTRAVENOUS | Status: AC
  Administered 2017-08-15: 1 mg via INTRAVENOUS
  Filled 2017-08-15: qty 1

## 2017-08-15 MED ORDER — FAMOTIDINE IN NACL 20-0.9 MG/50ML-% IV SOLN
20.0000 mg | Freq: Two times a day (BID) | INTRAVENOUS | Status: DC
  Administered 2017-08-15 (×2): 20 mg via INTRAVENOUS
  Filled 2017-08-15 (×5): qty 50

## 2017-08-15 MED ORDER — OMEPRAZOLE 20 MG PO CPDR
20.0000 mg | DELAYED_RELEASE_CAPSULE | Freq: Two times a day (BID) | ORAL | Status: DC
  Administered 2017-08-15 – 2017-08-16 (×3): 20 mg via ORAL
  Filled 2017-08-15 (×6): qty 1

## 2017-08-15 MED ORDER — MORPHINE SULFATE (PF) 2 MG/ML IV SOLN: 2.0000 mg | INTRAVENOUS | Status: DC | PRN

## 2017-08-15 MED ORDER — IOPAMIDOL (ISOVUE-300) INJECTION 61%
100.0000 mL | Freq: Once | INTRAVENOUS | Status: AC | PRN
  Administered 2017-08-15: 100 mL via INTRAVENOUS

## 2017-08-15 NOTE — Progress Notes (Signed)
PHARMACY - ADULT TOTAL PARENTERAL NUTRITION CONSULT NOTE   Pharmacy Consult for TPN Indication: small bowel obstruction  Patient Measurements: Height: 5\' 2"  (157.5 cm) Weight: 233 lb (105.7 kg) IBW/kg (Calculated) : 50.1 TPN AdjBW (KG): 62.6 Body mass index is 42.62 kg/m. Usual Weight:   Assessment:   GI:  Endo: Insulin requirements in the past 24 hours: 11 units Lytes: K 4.7, Mg 2.7, Phos 4.9  Renal:Scr: 0.80m, CrCl: 74.29ml/min Pulm: Cards:  Hepatobil: Neuro: ID:  Best Practices: TPN Access:10/19 TPN start date: August 10 2017   Current Nutrition:  Clinimix E 5/15 at 65 mL/hr with MVI, trace elements, and thiamine 20% lipid emulsion at 20 mL/hr over 12 hours  Plan:  Patient complained of acid reflux on yesterday and requested TPN and fat be stopped. Spoke with RD Will this afternoon and RD spoke with MD Rosana Hoes who will speak with patient.  Will restart Clinimix 5/15 @ 41 ml/hr with trace elements and mvi. Will also order fat emulsion @ 15 ml/hr over 12 hours.     Octavia Velador D, Clinical Pharmacist 08/15/2017,2:21 PM

## 2017-08-15 NOTE — Progress Notes (Addendum)
Nutrition Follow-up  DOCUMENTATION CODES:   Severe malnutrition in context of acute illness/injury  INTERVENTION:   Recommend restart Clinimix 5/15 without electrolytes at 17ml/hr  20% lipids to 59ml/hr x 12 hrs/day  Regimen provides 1059kcal/day (62% estimated needs), 49g/day protein (49% estimated needs), 1153ml volume  Recommend check K, P, and Mg 10/25.  Continue MVI daily   Continue daily weights  D/C premier protein  NUTRITION DIAGNOSIS:   Malnutrition related to acute illness (SBO) as evidenced by energy intake < or equal to 50% for > or equal to 5 days, percent weight loss. -ongoing  GOAL:   Patient will meet greater than or equal to 90% of their needs -not meeting currently, will meet 50-60% of needs with TPN  MONITOR:   Labs, Weight trends, I & O's, Other (Comment) (TPN)  REASON FOR ASSESSMENT:   Malnutrition Screening Tool    ASSESSMENT:   56 y.o. 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 CT scan in the emergency room suggest a bowel obstruction with a transition point in an area where prior ischemia was identified.  Patient was complaining of severe acid reflux last night, stating that she felt TPN and ILE was causing it. TPN was stopped at that time per Dr. Anselm Jungling. Spoke with patient this morning about how she was feeling. Continues to have nausea and heartburn. Encouraged her to utilize TPN due to her 1L of vomit, inability to tolerate diet. She continues to insist that TPN made her heartburn worse and that she doesn't want it. Spoke with surgery - will discuss  with patient to encourage TPN. Discussed with pharmacy. No PO intake today. Weight stable from yesterday, up 9 pounds since admit.  Access: Double Lumen R PICC 10/18 >>   Intake/Output Summary (Last 24 hours) at 08/15/17 1525 Last data filed at 08/15/17 1400  Gross per 24 hour  Intake           558.67 ml  Output             1500 ml  Net          -941.33 ml  574mL UOP documented yesterday, 221mL so far today. 12.8L Fluid positive  Labs reviewed:  Mg 2.7  Medications reviewed and include:  Miralax, Senokot-S, Zofran, Novolog 0-15 Units every 4 hours, Pepcid, Pantoprazole   Diet Order:  DIET SOFT Room service appropriate? Yes; Fluid consistency: Thin TPN (CLINIMIX) Adult without lytes TPN (CLINIMIX) Adult without lytes  Skin:  Reviewed, no issues  Last BM:  08/15/2017  Height:   Ht Readings from Last 1 Encounters:  08/06/17 5\' 2"  (1.575 m)    Weight:   Wt Readings from Last 1 Encounters:  08/15/17 229 lb 4.8 oz (104 kg)    Ideal Body Weight:  50 kg  BMI:  Body mass index is 41.94 kg/m.  Estimated Nutritional Needs:   Kcal:  1700-2000kcal/day   Protein:  100-110g/day   Fluid:  >1.6L/day   EDUCATION NEEDS:   Education needs addressed  Satira Anis. Aniken Monestime, MS, RD LDN Inpatient Clinical Dietitian Pager [phone]

## 2017-08-15 NOTE — Progress Notes (Signed)
Reported pts emesis occurrence of 1000 mL to Dr. Rosana Hoes.

## 2017-08-15 NOTE — Progress Notes (Signed)
Pt refused TPN and Fats. Dr. Rosana Hoes is aware and he made the patient aware that it is her choice but it is advised that she is in need of the nutrients.

## 2017-08-15 NOTE — Progress Notes (Addendum)
ADDENDUM: Non-bloody emesis x 1 Liter per patient's RN. Considering prolonged ileus with difficult-to-control GERD, nausea, and now emesis, will check CT abdomen and pelvis to assess for abscess or potentially contributing post-operative process otherwise.  -- Marilynne Drivers Rosana Hoes, MD, [place]: [facility] - Partnering for exceptional care. Office: [place] Hospital Day(s): 9.   Post op day(s): 7 Days Post-Op.   Interval History: Patient seen and examined, no acute events or new complaints overnight. Patient reports heartburn, nausea, and abdominal pain have all improved with multiple loose but non-watery BM's following chronic constipation and post-operative ileus, though patient continues to complain of heartburn with nausea and 10 - 30 mL episodes of emesis x 1 - 2, denies fever/chills, CP, or SOB.  Review of Systems:  Constitutional: denies fever, chills  HEENT: denies cough or congestion  Respiratory: denies any shortness of breath  Cardiovascular: denies chest pain or palpitations  Gastrointestinal: abdominal pain, N/V, and bowel function as per interval history Genitourinary: denies burning with urination or urinary frequency Musculoskeletal: denies pain, decreased motor or sensation Integumentary: denies any other rashes or skin discolorations except post-surgical abdominal wound Neurological: denies HA or vision/hearing changes   Vital signs in last 24 hours: [min-max] current  Temp:  [98.2 F (36.8 C)-98.4 F (36.9 C)] 98.2 F (36.8 C) (10/24 0324) Pulse Rate:  [80-112] 99 (10/24 0324) Resp:  [20] 20 (10/24 0324) BP: (111-161)/(60-88) 161/88 (10/24 0324) SpO2:  [98 %-100 %] 100 % (10/24 0324) Weight:  [233 lb (105.7 kg)] 233 lb (105.7 kg) (10/24 0324)     Height: 5\' 2"  (157.5 cm) Weight: 233 lb (105.7 kg) BMI (Calculated): 42.61   Intake/Output this shift:  No intake/output data recorded.    Intake/Output last 2 shifts:  @IOLAST2SHIFTS @   Physical Exam:  Constitutional: alert, cooperative and no distress  HENT: normocephalic without obvious abnormality  Eyes: PERRL, EOM's grossly intact and symmetric  Neuro: CN II - XII grossly intact and symmetric without deficit  Respiratory: breathing non-labored at rest  Cardiovascular: regular rate and sinus rhythm  Gastrointestinal: soft, obese, and not visibly distended with mild peri-incisional tenderness to palpation, incision well-approximated with no drainage able to be expressed and dressing c/d/i (despite RN reporting staining overnight), no peri-incisional erythema Musculoskeletal: UE and LE FROM, motor and sensation grossly intact, NT   Labs:  CBC Latest Ref Rng & Units 08/13/2017 08/11/2017 08/10/2017  WBC 3.6 - 11.0 K/uL 9.0 9.5 11.0  Hemoglobin 12.0 - 16.0 g/dL 9.5(L) 10.1(L) 11.3(L)  Hematocrit 35.0 - 47.0 % 29.3(L) 30.3(L) 32.7(L)  Platelets 150 - 440 K/uL 289 232 239   CMP Latest Ref Rng & Units 08/15/2017 08/14/2017 08/13/2017  Glucose 65 - 99 mg/dL - - 162(H)  BUN 6 - 20 mg/dL - - 25(H)  Creatinine 0.44 - 1.00 mg/dL - - 0.95  Sodium 135 - 145 mmol/L - - 138  Potassium 3.5 - 5.1 mmol/L 4.7 4.2 3.8  Chloride 101 - 111 mmol/L - - 102  CO2 22 - 32 mmol/L - - 25  Calcium 8.9 - 10.3 mg/dL - - 8.5(L)  Total Protein 6.5 - 8.1 g/dL - - 5.8(L)  Total Bilirubin 0.3 - 1.2 mg/dL - - 0.6  Alkaline Phos 38 - 126 U/L - - 163(H)  AST 15 - 41 U/L - - 18  ALT 14 - 54 U/L - - 15   Imaging studies: No new pertinent imaging studies  Assessment/Plan:(ICD-10's: K54.52) 56 y.o.femalewith slowly resolving post-operative ileus and constipation 7 Days Post-Ops/p exploratory laparotomy with lysis of adhesions and small bowel resection with primary anastomosisfor SBO attributed to focal enteritis/thickening/scar tissueat site of recent small bowel (ileum) ischemia secondary to SMA thromboembolus with aortic thrombus (not  intracardiac thrombus on echo) s/p SMA stent placement, complicated by chronic severe constipation and other pertinent comorbidities including obesity (BMI 40), HTN, HLD, CAD with diastolic CHF, aortic thrombus, Bell's Palsy, and cancer not otherwise specified.  - pain control prn, minimize narcotics - soft diet with Premiere high protein shakes, TPN - continue to monitor abdominal exam and bowel function - agree with continue oral Eliquis anticoagulation - medical management of comorbidities - ambulation strongly encouraged  All of the above findings and recommendations were discussed with the patient, and all of patient's questions were answered to her expressed satisfaction.  -- Marilynne Drivers Rosana Hoes, MD, [place]: [facility] - Partnering for exceptional care. Office: [phone]

## 2017-08-15 NOTE — Progress Notes (Signed)
Pt reported chest pain. VSS. Rn reported to Dr. Rosana Hoes. Internal medicine consult placed. Spoke with Dr. Manuella Ghazi. Pt ordered EKG, morphine, pepcid IV, and maalox/mylanta. Troponins drawn on pt. Pt began reporting relief

## 2017-08-15 NOTE — Consult Note (Signed)
[facility] Carla [name] Face-to-Face Psychiatry Consult   Reason for Consult: Consult for 56 year old woman with multiple medical problems currently recovering from bowel surgery and having trouble getting back into regular oral intake.  Consult for what appeared to possibly be exaggerated pain and distress Referring Physician:  Anselm Jungling Patient Identification: Carla [name] MRN:  [id number] Principal Diagnosis: Adjustment disorder with anxiety Diagnosis:   Patient Active Problem List   Diagnosis Date Noted  . Adjustment disorder with anxiety [F43.22] 08/15/2017  . Chronic idiopathic constipation [K59.04]   . Protein-calorie malnutrition, severe [E43] 08/08/2017  . SBO (small bowel obstruction) ([place]) [K56.609] 08/06/2017  . Chronic mesenteric ischemia ([place]) [K55.1]   . AKI (acute kidney injury) ([place]) [N17.9] 07/29/2017  . Hypotension [I95.9] 07/29/2017  . 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[place]) [K55.059]   . Abdominal pain [R10.9]   . Occlusive mesenteric ischemia ([place]) [K55.059] 06/19/2017  . 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 Acute focal ischemia of small intestine ([place]) [K55.011]   . Acute right-sided low back pain with right-sided sciatica [M54.41] 06/13/2017  . Primary osteoarthritis of both knees [M17.0] 06/13/2017  . Pain syndrome, chronic [G89.4] 09/13/2016  . Morbid obesity with BMI of 40.0-44.9, adult ([place]) [E66.01, Z68.41] 07/16/2016  . Personal history of other malignant neoplasm of skin [Z85.828] 06/01/2016  . Chronic pain of both knees [M25.561, M25.562, G89.29] 01/19/2016  . Essential hypertension with goal blood pressure less than 140/90 [I10] 01/19/2016  . Gastroesophageal reflux disease [K21.9] 01/19/2016  . Hyperlipidemia [E78.5] 01/19/2016  . Prediabetes [R73.03] 01/19/2016  . Recurrent major depressive disorder ([place]) [F33.9] 01/19/2016  . Urinary tract infection [N39.0] 01/09/2014  . Chronic pelvic pain in female [R10.2, G89.29] 11/14/2013  . 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 Chest pain [R07.9] 08/06/2012    Total Time spent with patient: 1 hour  Subjective:   Carla [name] is a 56 y.o. female patient admitted with "I am throwing up".  HPI: Patient interviewed chart reviewed.  Her sisters came in later in the interview also and mediation.  Patient was not a very willing or forthcoming historian.  Consult was requested because of perceived exaggerated complaints of pain or unrealistic complaints of pain being caused by parenteral nutrition.  On interview today I found the patient awake and responsive but unwilling to make much eye contact.  Looked at me very little.  Mostly kept her eyes shut.  Told me that she was still feeling sick to her stomach and throwing up all through the day.  She tells me that she is most concerned about the pain which she indicates is in her chest and abdomen where she had the surgery.  She also feels like she is having heartburn type pain that is worse when she tries to eat.  She denies having significant pain in any other part of her body but when I specifically ask did say that her legs hurt.  I was able to touch her gently on her extremities and did not elicit any kind of pain reaction.  Patient denies feeling depressed.  Denies feeling hopeless.  Denies any suicidal thoughts.  Admits that she is having a little difficulty sleeping.  Denies any hallucinations delirium or psychotic symptoms.  Patient has been prescribed as needed medicine for anxiety but has not been making use of it.  Family tells me that her mother died just 2 weeks previously as well.  Also points out that the patient has been very stressed by pending  knee surgery that was interrupted by now this abdominal surgery.  Social history: Lives at home with her husband.  There is family who are close with her.  Denies any social problems.  Medical history: Multiple and extensive medical problems.  Abdominal aortic atherosclerosis  also thrombus status post recent serious abdominal surgery.  Past history of heart disease hyperlipidemia hypertension Bell's palsy  Substance abuse history: Nothing in the chart indicates any past concerns about substance abuse.  Patient denies alcohol or drug abuse.  Looking back over her prescriptions it appears that she has been prescribed tramadol and occasionally slightly stronger narcotics but has not been on regular doses of antianxiety medicines.  Past Psychiatric History: Patient denies ever seeing a doctor for mental health problems denies any history of depression.  Does admit that she has talked to doctors about anxiety in the past but will not go into any detail about it.  No history of hospitalization no history of suicide attempts.  Currently she is being offered 1/2 mg of alprazolam twice a day as needed but has not been taking it  Risk to Self: Is patient at risk for suicide?: No Risk to Others:   Prior Inpatient Therapy:   Prior Outpatient Therapy:    Past Medical History:  Past Medical History:  Diagnosis Date  . Abdominal aortic atherosclerosis ([place])    a. 05/2017 CTA abd/pelvis: significant atherosclerotic dzs of the infrarenal abd Ao w/ some mural thrombus. No aneurysm or dissection.  . Baker's cyst of knee, right    a. 07/2016 U/S: 4.1 x 1.4 x 2.9 cystic structure in R poplitetal fossa.  . Bell's palsy   . Cancer ([place])   . Chest pain    a. 08/2012 Lexiscan MV: EF 54%, non ischemia/infarct.  . Diastolic dysfunction    a. 05/2017 Echo: Ef 60-65%, no rwma, Gr1 DD, no source of cardiac emboli.  . 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[place])    a. 05/2017 CTA Abd/pelvis: apparent thrombus or embolus in prox SMA (70-90%); b. 05/2017 catheter directed tPA, mechanical thrombectomy, and stenting of the SMA.  [name] GERD (gastroesophageal reflux disease)   . Hyperlipidemia   . Hypertension   . Morbid obesity with BMI of 40.0-44.9, adult [facility])     Past Surgical History:  Procedure  Laterality Date  . CHOLECYSTECTOMY    . LAPAROTOMY N/A 08/08/2017   Procedure: EXPLORATORY LAPAROTOMY POSSIBLE BOWEL RESECTION;  Surgeon: Jules Husbands, MD;  Location: [facility] ORS;  Service: General;  Laterality: N/A;  . TEE WITHOUT CARDIOVERSION N/A 06/22/2017   Procedure: TRANSESOPHAGEAL ECHOCARDIOGRAM (TEE);  Surgeon: Wellington Hampshire, MD;  Location: [facility] ORS;  Service: Cardiovascular;  Laterality: N/A;  . VAGINAL HYSTERECTOMY    . VISCERAL ARTERY INTERVENTION N/A 06/20/2017   Procedure: Visceral Artery Intervention, possible aortic thrombectomy;  Surgeon: Algernon Huxley, MD;  Location: [place] CV LAB;  Service: Cardiovascular;  Laterality: N/A;   Family History:  Family History  Problem Relation Age of Onset  . Hypertension Mother   . Heart disease Mother   . Heart attack Mother   . Breast cancer Mother   . Hypertension Father   . Breast cancer Paternal Aunt    Family Psychiatric  History: Denies any Social History:  History  Alcohol Use No     History  Drug Use No    Social History   Social History  . Marital status: Single    Spouse name: N/A  . Number of children: N/A  .  Years of education: N/A   Occupational History  . Unemployed    Social History Main Topics  . Smoking status: Current Some Day Smoker    Packs/day: 0.50    Types: Cigarettes  . Smokeless tobacco: Never Used  . Alcohol use No  . Drug use: No  . Sexual activity: No   Other Topics Concern  . None   Social History Narrative   Lives in [place] with boyfriend.  Does not routinely exercise.   Additional Social History:    Allergies:  No Known Allergies  Labs:  Results for orders placed or performed during the hospital encounter of 08/06/17 (from the past 48 hour(s))  Glucose, capillary     Status: Abnormal   Collection Time: 08/13/17  8:31 PM  Result Value Ref Range   Glucose-Capillary 137 (H) 65 - 99 mg/dL  Glucose, capillary     Status: Abnormal   Collection Time: 08/14/17 12:09 AM   Result Value Ref Range   Glucose-Capillary 134 (H) 65 - 99 mg/dL  Phosphorus     Status: Abnormal   Collection Time: 08/14/17  3:47 AM  Result Value Ref Range   Phosphorus 4.9 (H) 2.5 - 4.6 mg/dL  Potassium     Status: None   Collection Time: 08/14/17  3:47 AM  Result Value Ref Range   Potassium 4.2 3.5 - 5.1 mmol/L  Magnesium     Status: Abnormal   Collection Time: 08/14/17  3:47 AM  Result Value Ref Range   Magnesium 2.7 (H) 1.7 - 2.4 mg/dL  Glucose, capillary     Status: Abnormal   Collection Time: 08/14/17  4:08 AM  Result Value Ref Range   Glucose-Capillary 128 (H) 65 - 99 mg/dL  Glucose, capillary     Status: Abnormal   Collection Time: 08/14/17  7:35 AM  Result Value Ref Range   Glucose-Capillary 141 (H) 65 - 99 mg/dL   Comment 1 Notify RN   Glucose, capillary     Status: Abnormal   Collection Time: 08/14/17 11:39 AM  Result Value Ref Range   Glucose-Capillary 118 (H) 65 - 99 mg/dL   Comment 1 Notify RN   Glucose, capillary     Status: Abnormal   Collection Time: 08/14/17  3:58 PM  Result Value Ref Range   Glucose-Capillary 101 (H) 65 - 99 mg/dL   Comment 1 Notify RN   Glucose, capillary     Status: Abnormal   Collection Time: 08/14/17  8:24 PM  Result Value Ref Range   Glucose-Capillary 109 (H) 65 - 99 mg/dL  Glucose, capillary     Status: Abnormal   Collection Time: 08/15/17 12:33 AM  Result Value Ref Range   Glucose-Capillary 112 (H) 65 - 99 mg/dL  Glucose, capillary     Status: Abnormal   Collection Time: 08/15/17  3:21 AM  Result Value Ref Range   Glucose-Capillary 117 (H) 65 - 99 mg/dL  Potassium     Status: None   Collection Time: 08/15/17  3:38 AM  Result Value Ref Range   Potassium 4.7 3.5 - 5.1 mmol/L  Magnesium     Status: Abnormal   Collection Time: 08/15/17  3:38 AM  Result Value Ref Range   Magnesium 2.7 (H) 1.7 - 2.4 mg/dL  Phosphorus     Status: None   Collection Time: 08/15/17  3:38 AM  Result Value Ref Range   Phosphorus 4.3 2.5 - 4.6  mg/dL  Glucose, capillary     Status: Abnormal  Collection Time: 08/15/17  7:37 AM  Result Value Ref Range   Glucose-Capillary 111 (H) 65 - 99 mg/dL   Comment 1 Notify RN   Glucose, capillary     Status: None   Collection Time: 08/15/17 11:43 AM  Result Value Ref Range   Glucose-Capillary 98 65 - 99 mg/dL   Comment 1 Notify RN   Troponin I     Status: Abnormal   Collection Time: 08/15/17  2:57 PM  Result Value Ref Range   Troponin I 0.03 (HH) <0.03 ng/mL    Comment: CRITICAL RESULT CALLED TO, READ BACK BY AND VERIFIED WITH PHYLLIS BRIGGS 08/15/17 @ 1617  MLK   Glucose, capillary     Status: None   Collection Time: 08/15/17  4:18 PM  Result Value Ref Range   Glucose-Capillary 83 65 - 99 mg/dL    Current Facility-Administered Medications  Medication Dose Route Frequency Provider Last Rate Last Dose  . alum & mag hydroxide-simeth (MAALOX/MYLANTA) 200-200-20 MG/5ML suspension 15 mL  15 mL Oral Q6H PRN Vickie Epley, MD   15 mL at 08/15/17 1438  . apixaban (ELIQUIS) tablet 5 mg  5 mg Oral BID Caroleen Hamman F, MD   5 mg at 08/14/17 2108  . calcium carbonate (TUMS - dosed in mg elemental calcium) chewable tablet 200 mg of elemental calcium  200 mg of elemental calcium Oral BID PRN Piscoya, Jose, MD      . famotidine (PEPCID) IVPB 20 mg premix  20 mg Intravenous Q12H Vickie Epley, MD   Stopped at 08/15/17 1544  . TPN (CLINIMIX) Adult without lytes   Intravenous Continuous TPN Larene Beach, [facility]       And  . fat emulsion 20 % infusion 250 mL  250 mL Intravenous Continuous TPN Larene Beach, RPH      . gabapentin (NEURONTIN) capsule 400 mg  400 mg Oral TID Pabon, Diego F, MD      . insulin aspart (novoLOG) injection 0-15 Units  0-15 Units Subcutaneous Q4H Larene Beach, [facility]   2 Units at 08/14/17 8527  . LORazepam (ATIVAN) injection 0.5 mg  0.5 mg Intravenous Q8H Kazoua Gossen T, MD      . metoprolol tartrate (LOPRESSOR) tablet 50 mg  50 mg Oral BID Florene Glen, MD    50 mg at 08/14/17 2108  . morphine 2 MG/ML injection 2 mg  2 mg Intravenous Q4H PRN Vaughan Basta, MD      . nicotine (NICODERM CQ - dosed in mg/24 hr) patch 7 mg  7 mg Transdermal Daily Vickie Epley, MD      . ondansetron [facility]) tablet 4 mg  4 mg Oral Q6H PRN Florene Glen, MD       Or  . ondansetron [facility]) injection 4 mg  4 mg Intravenous Q6H PRN Florene Glen, MD   4 mg at 08/15/17 1044  . ondansetron (ZOFRAN) injection 4 mg  4 mg Intravenous Q6H Pabon, Diego F, MD   4 mg at 08/15/17 1231  . oxyCODONE-acetaminophen (PERCOCET/ROXICET) 5-325 MG per tablet 1-2 tablet  1-2 tablet Oral Q4H PRN Jules Husbands, MD   2 tablet at 08/14/17 2044  . pantoprazole (PROTONIX) EC tablet 40 mg  40 mg Oral BID Vickie Epley, MD   40 mg at 08/14/17 2108  . polyethylene glycol (MIRALAX / GLYCOLAX) packet 17 g  17 g Oral BID Caroleen Hamman F, MD   17 g at 08/14/17 2108  .  promethazine (PHENERGAN) injection 12.5 mg  12.5 mg Intravenous Q6H PRN Piscoya, Jose, MD   12.5 mg at 08/15/17 1543  . senna-docusate (Senokot-S) tablet 1 tablet  1 tablet Oral QHS Vickie Epley, MD   1 tablet at 08/14/17 2108  . sodium chloride flush (NS) 0.9 % injection 10-40 mL  10-40 mL Intracatheter Q12H Florene Glen, MD   10 mL at 08/15/17 1044  . sodium chloride flush (NS) 0.9 % injection 10-40 mL  10-40 mL Intracatheter PRN Florene Glen, MD      . TPN [facility]) Adult without lytes   Intravenous Continuous TPN Pernell Dupre, [facility]   Stopped at 08/14/17 2015    Musculoskeletal: Strength & Muscle Tone: decreased Gait & Station: unsteady Patient leans: N/A  Psychiatric Specialty Exam: Physical Exam  Nursing note and vitals reviewed. Constitutional: She appears well-developed.  HENT:  Head: Normocephalic and atraumatic.  Eyes: Pupils are equal, round, and reactive to light. Conjunctivae are normal.  Neck: Normal range of motion.  Cardiovascular: Normal heart sounds.   Respiratory: Effort  normal. No respiratory distress.  GI: There is tenderness.  Musculoskeletal: Normal range of motion.  Neurological: She is alert.  Skin: Skin is warm and dry.  Psychiatric: Her mood appears anxious. Her affect is blunt. Her speech is delayed. She is withdrawn. Thought content is not paranoid. She expresses no homicidal and no suicidal ideation.    Review of Systems  Constitutional: Positive for malaise/fatigue and weight loss.  HENT: Negative.   Eyes: Negative.   Respiratory: Negative.   Cardiovascular: Negative.   Gastrointestinal: Positive for abdominal pain, nausea and vomiting.  Musculoskeletal: Negative.   Skin: Negative.   Neurological: Positive for weakness.  Psychiatric/Behavioral: Negative for depression, hallucinations, memory loss, substance abuse and suicidal ideas. The patient is not nervous/anxious and does not have insomnia.     Blood pressure (!) 145/64, pulse (!) 116, temperature 97.8 F (36.6 C), temperature source Oral, resp. rate (!) 26, height 5\' 2"  (1.575 m), weight 229 lb 4.8 oz (104 kg), SpO2 97 %.Body mass index is 41.94 kg/m.  General Appearance: Casual  Eye Contact:  Minimal  Speech:  Slow  Volume:  Decreased  Mood:  Anxious and Dysphoric  Affect:  Congruent  Thought Process:  Descriptions of Associations: Circumstantial  Orientation:  Full (Time, Place, and Person)  Thought Content:  Rumination  Suicidal Thoughts:  No  Homicidal Thoughts:  No  Memory:  Immediate;   Fair Recent;   Fair Remote;   Fair  Judgement:  Fair  Insight:  Shallow  Psychomotor Activity:  Decreased  Concentration:  Concentration: Fair  Recall:  [employer] of Knowledge:  Fair  Language:  Fair  Akathisia:  No  Handed:  Right  AIMS (if indicated):     Assets:  Housing Social Support  ADL's:  Impaired  Cognition:  Impaired,  Mild  Sleep:        Treatment Plan Summary: Daily contact with patient to assess and evaluate symptoms and progress in treatment, Medication  management and Plan 56 year old woman with multiple medical problems including currently having frequent nausea and vomiting that has limited her ability to take p.o. nutrition.  TPN has been attempted but the patient complained that TPN was making her stomach hurt much worse and has refused to allow it to be restarted.  On interview I found the patient to be only partially cooperative.  She did not act as though she were in terrible pain but  she made minimal eye contact with me and answered questions as simply as possible.  Part of this seemed to be that she was taking the attitude of wanting to make it clear to me "I am not crazy" as soon as I introduced myself.  I tried several times to reassure her that we just wanted to help with calming her nerves and making this difficult hospital stay easier for her.  This did not seem to be of much reassurance to her.  Patient does not appear to clearly have depression.  No evidence suicidal or homicidal ideation.  Probably he is very anxious and having somatic symptoms that are worsened by her anxiety.  My recommendation is that we go ahead and give her some standing benzodiazepines to help with the nerves.  Do not want to give her so much that it makes her sedated.  I am discontinuing the as needed Xanax and replacing it with 1/2 mg of Ativan 3 times a day to see if this kind of low-dose round-the-clock treatment helps enough with the anxiety that she is better able to eat and cooperate with treatment.  I reassured her that I would check to make sure that everything was being done to manage her complaints of reflux type pain.  She did not ask me for any other additional pain medicine or have any other complaints.  I will continue to follow up in the hospital as needed.  Disposition: Patient does not meet criteria for psychiatric inpatient admission. Supportive therapy provided about ongoing stressors.  Alethia Berthold, MD 08/15/2017 5:25 PM

## 2017-08-15 NOTE — Progress Notes (Signed)
Called Dr. Anselm Jungling and gave him Troponin result. He said he was aware and he is waiting on the next blood draw.

## 2017-08-15 NOTE — Consult Note (Signed)
[place] [facility] [place] NAME: Carla [name]    MR#:  [id number]  DATE OF BIRTH:  Dec 26, 1960  DATE OF ADMISSION:  08/06/2017  PRIMARY CARE PHYSICIAN: Patient, No Pcp Per   REQUESTING/REFERRING PHYSICIAN: Rosana Hoes  CHIEF COMPLAINT:   Chief Complaint  Patient presents with  . Abdominal Pain    HISTORY OF PRESENT ILLNESS: Carla [name]  is a 56 y.o. female with a known history of AA atherosclerosis, Bell's palsy, Arthritis, SMA emboli- s/p thrombolysis and stent, Hyperlipidemia, Htn, Obesity- On Eliquis - admitted with c/o abdominal pain and now s/p exploratory laparotomy with lysis of adhesions and small bowel resection with primary anastomosisfor SBO - Post op day 9. Not good oral intake , so on TPN and have cont c/o epigastric and chest pain. TPN is stopped due to her concern of " That makes my pain worse." Medical consult called in for chest pain today. She describes the pain is stabbing type and last for few min every time , no relation with food or position.  PAST MEDICAL HISTORY:   Past Medical History:  Diagnosis Date  . Abdominal aortic atherosclerosis ([employer])    a. 05/2017 CTA abd/pelvis: significant atherosclerotic dzs of the infrarenal abd Ao w/ some mural thrombus. No aneurysm or dissection.  . Baker's cyst of knee, right    a. 07/2016 U/S: 4.1 x 1.4 x 2.9 cystic structure in R poplitetal fossa.  . Bell's palsy   . Cancer ([place])   . Chest pain    a. 08/2012 Lexiscan MV: EF 54%, non ischemia/infarct.  . Diastolic dysfunction    a. 05/2017 Echo: Ef 60-65%, no rwma, Gr1 DD, no source of cardiac emboli.  . 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[place])    a. 05/2017 CTA Abd/pelvis: apparent thrombus or embolus in prox SMA (70-90%); b. 05/2017 catheter directed tPA, mechanical thrombectomy, and stenting of the SMA.  [name] GERD (gastroesophageal reflux disease)   . Hyperlipidemia   . Hypertension   . Morbid obesity with BMI of 40.0-44.9, adult ([place])      PAST SURGICAL HISTORY: Past Surgical History:  Procedure Laterality Date  . CHOLECYSTECTOMY    . LAPAROTOMY N/A 08/08/2017   Procedure: EXPLORATORY LAPAROTOMY POSSIBLE BOWEL RESECTION;  Surgeon: Jules Husbands, MD;  Location: [facility] ORS;  Service: General;  Laterality: N/A;  . TEE WITHOUT CARDIOVERSION N/A 06/22/2017   Procedure: TRANSESOPHAGEAL ECHOCARDIOGRAM (TEE);  Surgeon: Wellington Hampshire, MD;  Location: [facility] ORS;  Service: Cardiovascular;  Laterality: N/A;  . VAGINAL HYSTERECTOMY    . VISCERAL ARTERY INTERVENTION N/A 06/20/2017   Procedure: Visceral Artery Intervention, possible aortic thrombectomy;  Surgeon: Algernon Huxley, MD;  Location: [facility] [place] [facility];  Service: Cardiovascular;  Laterality: N/A;    SOCIAL HISTORY:  Social History  Substance Use Topics  . Smoking status: Current Some Day Smoker    Packs/day: 0.50    Types: Cigarettes  . Smokeless tobacco: Never Used  . Alcohol use No    FAMILY HISTORY:  Family History  Problem Relation Age of Onset  . Hypertension Mother   . Heart disease Mother   . Heart attack Mother   . Breast cancer Mother   . Hypertension Father   . Breast cancer Paternal Aunt     DRUG ALLERGIES: No Known Allergies  REVIEW OF SYSTEMS:   CONSTITUTIONAL: No fever, fatigue or weakness.  EYES: No blurred or double vision.  EARS, NOSE, AND THROAT: No tinnitus or ear pain.  RESPIRATORY: No cough, shortness of breath, wheezing or hemoptysis.  CARDIOVASCULAR: have chest pain,no orthopnea, edema.  GASTROINTESTINAL: No nausea, vomiting, diarrhea , have abdominal pain.  GENITOURINARY: No dysuria, hematuria.  ENDOCRINE: No polyuria, nocturia,  HEMATOLOGY: No anemia, easy bruising or bleeding SKIN: No rash or lesion. MUSCULOSKELETAL: No joint pain or arthritis.   NEUROLOGIC: No tingling, numbness, weakness.  PSYCHIATRY: No anxiety or depression.   MEDICATIONS AT HOME:  Prior to Admission medications   Medication Sig Start Date End Date  Taking? Authorizing Provider  apixaban (ELIQUIS) 5 MG TABS tablet Take 1 tablet (5 mg total) by mouth 2 (two) times daily. 06/28/17  Yes Piscoya, Jacqulyn Bath, MD  atorvastatin (LIPITOR) 80 MG tablet Take 80 mg by mouth at bedtime.   Yes [provider]  cephALEXin (KEFLEX) 500 MG capsule Take 1 capsule (500 mg total) by mouth every 12 (twelve) hours. 08/02/17  Yes Gouru, Illene Silver, MD  esomeprazole (NEXIUM) 40 MG capsule Take 40 mg by mouth 2 (two) times daily before a meal.    Yes [provider]  gabapentin (NEURONTIN) 300 MG capsule Take 300 mg by mouth 2 (two) times daily.   Yes [provider]  lisinopril (PRINIVIL,ZESTRIL) 40 MG tablet Take 40 mg by mouth daily.   Yes [provider]  metoprolol tartrate (LOPRESSOR) 50 MG tablet Take 50 mg by mouth 2 (two) times daily.   Yes [provider]  senna-docusate (SENOKOT-S) 8.6-50 MG tablet Take 2 tablets by mouth 2 (two) times daily.   Yes [provider]  Vitamin D, Ergocalciferol, (DRISDOL) 50000 units CAPS capsule Take 1 capsule by mouth once a week. 06/13/17  Yes [provider]  acetaminophen (TYLENOL) 325 MG tablet Take 2 tablets (650 mg total) by mouth every 6 (six) hours as needed for mild pain (or Fever >/= 101). 08/02/17   Gouru, Illene Silver, MD  ALPRAZolam Duanne Moron) 0.5 MG tablet Take 1 tablet (0.5 mg total) by mouth 2 (two) times daily as needed for anxiety. Patient not taking: Reported on 07/29/2017 06/28/17   Olean Ree, MD  nicotine (NICODERM CQ - DOSED IN MG/24 HOURS) 14 mg/24hr patch Place 1 patch (14 mg total) onto the skin daily. Patient not taking: Reported on 08/06/2017 08/03/17   Nicholes Mango, MD  oxybutynin (DITROPAN) 5 MG tablet Take 1 tablet by mouth 3 (three) times daily as needed. 06/13/17   [provider]  polyethylene glycol (MIRALAX / GLYCOLAX) packet Take 17 g by mouth daily. Patient not taking: Reported on 08/06/2017 08/02/17   Nicholes Mango, MD  traMADol (ULTRAM) 50  MG tablet Take 50 mg by mouth every 6 (six) hours as needed for moderate pain.     [provider]      PHYSICAL EXAMINATION:   VITAL SIGNS: Blood pressure (!) 145/64, pulse (!) 116, temperature 97.8 F (36.6 C), temperature source Oral, resp. rate (!) 26, height 5\' 2"  (1.575 m), weight 104 kg (229 lb 4.8 oz), SpO2 97 %.  GENERAL:  56 y.o.-year-old patient lying in the bed with acute distress due to pains.  EYES: Pupils equal, round, reactive to light and accommodation. No scleral icterus. Extraocular muscles intact.  HEENT: Head atraumatic, normocephalic. Oropharynx and nasopharynx clear.  NECK:  Supple, no jugular venous distention. No thyroid enlargement, no tenderness.  LUNGS: Normal breath sounds bilaterally, no wheezing or crepitation. No use of accessory muscles of respiration.  CARDIOVASCULAR: S1, S2 normal. No murmurs, rubs, or gallops.  ABDOMEN: Soft, tender, some distended. Bowel sounds present.  No organomegaly or mass.  EXTREMITIES: She have pedal edema,no cyanosis, or clubbing. She just started moaning with pain, while I touch with one finger on her leg to check Edema. NEUROLOGIC: Cranial nerves II through XII are intact. Muscle strength 3-4/5 in all extremities. Sensation intact. Gait not checked.  PSYCHIATRIC: The patient is alert and oriented x 3. Very anxious about her pain and does not appear rational about her pain.  SKIN: No obvious rash, lesion, or ulcer.   LABORATORY PANEL:   CBC  Recent Labs Lab 08/09/17 0433 08/09/17 1013 08/10/17 0402 08/11/17 0452 08/13/17 0443  WBC 10.1 11.1* 11.0 9.5 9.0  HGB 11.5* 11.5* 11.3* 10.1* 9.5*  HCT 35.1 35.0 32.7* 30.3* 29.3*  PLT 244 255 239 232 289  MCV 88.6 87.8 88.1 89.3 88.3  MCH 29.2 28.9 30.4 29.9 28.7  MCHC 32.9 32.9 34.5 33.4 32.5  RDW 15.0* 15.0* 14.8* 14.7* 15.0*  LYMPHSABS 0.8*  --   --   --  1.4  MONOABS 0.6  --   --   --  0.6  EOSABS 0.0  --   --   --  0.1  BASOSABS 0.0  --   --   --  0.1    ------------------------------------------------------------------------------------------------------------------  Chemistries   Recent Labs Lab 08/09/17 0433 08/09/17 1013 08/10/17 0402 08/11/17 0452 08/12/17 0409 08/13/17 0443 08/14/17 0347 08/15/17 0338  NA 137 139 138 136 137 138  --   --   K 4.4 4.4 4.2 3.9 3.6 3.8 4.2 4.7  CL 103 102 104 106 106 102  --   --   CO2 25 26 28 25 22 25   --   --   GLUCOSE 190* 152* 150* 118* 127* 162*  --   --   BUN 9 10 10 20  25* 25*  --   --   CREATININE 0.95 0.92 0.84 1.00 0.96 0.95  --   --   CALCIUM 8.1* 8.3* 8.2* 8.2* 8.3* 8.5*  --   --   MG 2.0  --  1.9  --   --  2.2 2.7* 2.7*  AST 26  --  19  --  13* 18  --   --   ALT 22  --  16  --  12* 15  --   --   ALKPHOS 105  --  95  --  117 163*  --   --   BILITOT 0.4  --  0.3  --  0.4 0.6  --   --    ------------------------------------------------------------------------------------------------------------------ estimated creatinine clearance is 74 mL/min (by C-G formula based on SCr of 0.95 mg/dL). ------------------------------------------------------------------------------------------------------------------ No results for input(s): TSH, T4TOTAL, T3FREE, THYROIDAB in the last 72 hours.  Invalid input(s): FREET3   Coagulation profile No results for input(s): INR, PROTIME in the last 168 hours. ------------------------------------------------------------------------------------------------------------------- No results for input(s): DDIMER in the last 72 hours. -------------------------------------------------------------------------------------------------------------------  Cardiac Enzymes No results for input(s): CKMB, TROPONINI, MYOGLOBIN in the last 168 hours.  Invalid input(s): CK ------------------------------------------------------------------------------------------------------------------ Invalid input(s):  POCBNP  ---------------------------------------------------------------------------------------------------------------  Urinalysis    Component Value Date/Time   COLORURINE YELLOW (A) 08/06/2017 2143   APPEARANCEUR CLEAR (A) 08/06/2017 2143   APPEARANCEUR Clear 01/19/2015 1112   LABSPEC >1.046 (H) 08/06/2017 2143   LABSPEC 1.019 01/19/2015 1112   PHURINE 5.0 08/06/2017 2143   GLUCOSEU NEGATIVE 08/06/2017 2143   GLUCOSEU Negative 01/19/2015 1112   HGBUR NEGATIVE 08/06/2017 2143   BILIRUBINUR NEGATIVE 08/06/2017 2143   BILIRUBINUR Negative 01/19/2015 1112  [place] NEGATIVE 08/06/2017 2143   PROTEINUR NEGATIVE 08/06/2017 2143   NITRITE NEGATIVE 08/06/2017 2143   LEUKOCYTESUR NEGATIVE 08/06/2017 2143   LEUKOCYTESUR Negative 01/19/2015 1112     RADIOLOGY: No results found.  EKG: Orders placed or performed during the hospital encounter of 08/06/17  . EKG 12-Lead  . EKG 12-Lead  . EKG 12-Lead  . EKG 12-Lead    IMPRESSION AND PLAN:  * Chest pain  She does not have cardiac history   TEE and TTE done in August, without any clear abnormalities in August.   Hx of smoking and Hypertension, HLD make her at higher risk to have CAD, but I suspect her pain is Non cardiac in origin,   No hypoxia and already on anticoagulant, less likely PE.   Check for troponin .   EKG reviewed by Me, no change from previous other than tachycardia, sinus rhythm.   She have high anxiety and pain c/o in chest , abdomen, even legs , when I felt for edema.     I feel there is some underlying psych issues    Called psych evel.   I will wait for troponin, if any doubt- will call cardio consult.  * Epigastric pain    She claims, pain was worse by her TPN.    ( I was covering admission pager for cross covering pt;s on floor last night, and was called about this pt c/o pain and requesting to stop TPN. I had advised at that time to stop TPN and re-visit this issue in mornign.) I   Now after reviewing  her details, I encouraged her to cont TPN, also spoke to her family about this.   Will add Morphin INJ for pain control.    She is already on pantoprazol BID.  * Recent SMA Thrombus, s/p Thrombolysis    SBO and s/p Lysis of adhesion and partial bowel resection with end to end anastomosis.       Manage per surgery, Cont eliquis.  * Htn   Ocnt Lopressor  * Active smoking   Counceled to Quit for 4 min. On Nicotine patch.      All the records are reviewed and case discussed with ED provider. Management plans discussed with the patient, family and they are in agreement.  CODE STATUS: full.    Code Status Orders        Start     Ordered   08/06/17 2020  Full code  Continuous     08/06/17 2022    Code Status History    Date Active Date Inactive Code Status Order ID Comments User Context   07/29/2017  4:00 PM 08/02/2017  6:14 PM Full Code [id number]  Gorden Harms, MD Inpatient   06/19/2017  8:19 PM 06/28/2017  4:38 PM Full Code [id number]  Vickie Epley, MD ED       TOTAL TIME TAKING CARE OF THIS PATIENT: 50 minutes.    Vaughan Basta M.D on 08/15/2017   Between 7am to 6pm - Pager - [phone]  After 6pm go to [email or web address] - password [email or web address] [facility]  Office  [phone]  CC: Primary care physician; Patient, No Pcp Per   Note: This dictation was prepared with Dragon dictation along with smaller phrase technology. Any transcriptional errors that result from this process are unintentional.

## 2017-08-16 LAB — COMPREHENSIVE METABOLIC PANEL
ALT: 32 U/L (ref 14–54)
AST: 14 U/L — ABNORMAL LOW (ref 15–41)
Albumin: 2 g/dL — ABNORMAL LOW (ref 3.5–5.0)
Alkaline Phosphatase: 208 U/L — ABNORMAL HIGH (ref 38–126)
Anion gap: 7 (ref 5–15)
BUN: 18 mg/dL (ref 6–20)
CO2: 25 mmol/L (ref 22–32)
Calcium: 7.9 mg/dL — ABNORMAL LOW (ref 8.9–10.3)
Chloride: 105 mmol/L (ref 101–111)
Creatinine, Ser: 0.88 mg/dL (ref 0.44–1.00)
GFR calc Af Amer: >60 mL/min (ref >60)
GFR calc non Af Amer: >60 mL/min (ref >60)
Glucose, Bld: 94 mg/dL (ref 65–99)
Potassium: 4.8 mmol/L (ref 3.5–5.1)
Sodium: 137 mmol/L (ref 135–145)
Total Bilirubin: 0.2 mg/dL — ABNORMAL LOW (ref 0.3–1.2)
Total Protein: 5.9 g/dL — ABNORMAL LOW (ref 6.5–8.1)

## 2017-08-16 LAB — GLUCOSE, CAPILLARY
Glucose-Capillary: 85 mg/dL (ref 65–99)
Glucose-Capillary: 87 mg/dL (ref 65–99)
Glucose-Capillary: 89 mg/dL (ref 65–99)
Glucose-Capillary: 97 mg/dL (ref 65–99)
Glucose-Capillary: 97 mg/dL (ref 65–99)
Glucose-Capillary: 98 mg/dL (ref 65–99)

## 2017-08-16 LAB — TROPONIN I: Troponin I: 0.03 ng/mL (ref <0.03)

## 2017-08-16 LAB — MAGNESIUM: Magnesium: 2.5 mg/dL — ABNORMAL HIGH (ref 1.7–2.4)

## 2017-08-16 LAB — PHOSPHORUS: Phosphorus: 3.9 mg/dL (ref 2.5–4.6)

## 2017-08-16 MED ORDER — ADULT MULTIVITAMIN W/MINERALS CH
1.0000 | ORAL_TABLET | Freq: Every day | ORAL | Status: DC
  Administered 2017-08-16: 1 via ORAL
  Filled 2017-08-16: qty 1

## 2017-08-16 MED ORDER — INSULIN ASPART 100 UNIT/ML ~~LOC~~ SOLN: 0.0000 [IU] | Freq: Three times a day (TID) | SUBCUTANEOUS | Status: DC

## 2017-08-16 MED ORDER — ENSURE ENLIVE PO LIQD
237.0000 mL | Freq: Two times a day (BID) | ORAL | Status: DC
  Administered 2017-08-16 – 2017-08-17 (×2): 237 mL via ORAL

## 2017-08-16 MED ORDER — FAMOTIDINE 20 MG PO TABS
20.0000 mg | ORAL_TABLET | Freq: Two times a day (BID) | ORAL | Status: DC
  Administered 2017-08-16 – 2017-08-17 (×3): 20 mg via ORAL
  Filled 2017-08-16 (×3): qty 1

## 2017-08-16 NOTE — Progress Notes (Signed)
Called and spoke with Minette Brine RN, pts. unit nurse. Minette Brine spoke with MD while this nurse was on the phone and stated that "We will leave the PICC line in for now. If anything changes, I will let you know." No need for peripheral IV at this time. Fran Lowes, RN VAST

## 2017-08-16 NOTE — Progress Notes (Signed)
[facility] Day(s): 10.   Post op day(s): 8 Days Post-Op.   Interval History: Patient seen and examined, no acute events or new complaints overnight. Patient reports for the first time this admission that her heartburn and nausea are both improved, and she she has been drinking water, denies abdominal pain and asks when she can go home. She otherwise also denies fever/chills, CP, or SOB, says she feels ready/able to walk in the halls again today.  Review of Systems:  Constitutional: denies fever, chills  HEENT: denies cough or congestion  Respiratory: denies any shortness of breath  Cardiovascular: denies chest pain or palpitations  Gastrointestinal: abdominal pain, N/V, and bowel function as per interval history Genitourinary: denies burning with urination or urinary frequency Musculoskeletal: denies pain, decreased motor or sensation Integumentary: denies any other rashes or skin discolorations except post-surgical abdominal wound Neurological: denies HA or vision/hearing changes   Vital signs in last 24 hours: [min-max] current  Temp:  [97.8 F (36.6 C)-98.4 F (36.9 C)] 98 F (36.7 C) (10/25 0550) Pulse Rate:  [85-116] 85 (10/25 0550) Resp:  [20-26] 20 (10/25 0550) BP: (131-154)/(64-80) 131/67 (10/25 0550) SpO2:  [97 %-98 %] 98 % (10/25 0550) Weight:  [223 lb 3.2 oz (101.2 kg)-229 lb 4.8 oz (104 kg)] 223 lb 3.2 oz (101.2 kg) (10/25 0550)     Height: 5\' 2"  (157.5 cm) Weight: 223 lb 3.2 oz (101.2 kg) BMI (Calculated): 40.81   Intake/Output this shift:  No intake/output data recorded.   Intake/Output last 2 shifts:  @IOLAST2SHIFTS @   Physical Exam:  Constitutional: alert, cooperative and no distress  HENT: normocephalic without obvious abnormality  Eyes: PERRL, EOM's grossly intact and symmetric  Neuro: CN II - XII grossly intact and symmetric without deficit  Respiratory: breathing non-labored at rest  Cardiovascular: regular rate and sinus rhythm   Gastrointestinal: soft, obese, and not visibly distended with mild peri-incisional tenderness to palpation, incision well-approximated with no drainage able to be expressed and dressing c/d/i (despite RN reporting staining overnight), no peri-incisional erythema Musculoskeletal: UE and LE FROM, motor and sensation grossly intact, NT   Labs:  CBC Latest Ref Rng & Units 08/13/2017 08/11/2017 08/10/2017  WBC 3.6 - 11.0 K/uL 9.0 9.5 11.0  Hemoglobin 12.0 - 16.0 g/dL 9.5(L) 10.1(L) 11.3(L)  Hematocrit 35.0 - 47.0 % 29.3(L) 30.3(L) 32.7(L)  Platelets 150 - 440 K/uL 289 232 239   CMP Latest Ref Rng & Units 08/16/2017 08/15/2017 08/14/2017  Glucose 65 - 99 mg/dL 94 - -  BUN 6 - 20 mg/dL 18 - -  Creatinine 0.44 - 1.00 mg/dL 0.88 - -  Sodium 135 - 145 mmol/L 137 - -  Potassium 3.5 - 5.1 mmol/L 4.8 4.7 4.2  Chloride 101 - 111 mmol/L 105 - -  CO2 22 - 32 mmol/L 25 - -  Calcium 8.9 - 10.3 mg/dL 7.9(L) - -  Total Protein 6.5 - 8.1 g/dL 5.9(L) - -  Total Bilirubin 0.3 - 1.2 mg/dL 0.2(L) - -  Alkaline Phos 38 - 126 U/L 208(H) - -  AST 15 - 41 U/L 14(L) - -  ALT 14 - 54 U/L 32 - -   Cardiac markers:  Lab Results  Component Value Date   CKMB < 0.5 (L) 12/22/2012   Troponins (08/16/2017): 0.03, 0.04  Imaging studies:  CT Abdomen and Pelvis with Contrast (08/15/2017) - personally reviewed and discussed with patient and her family 1. Findings most suggestive of small-bowel ileus. No evidence of high-grade obstruction.  No pneumatosis or portal venous gas. 2. Contrast within the colon from prior barium study. 3. No acute findings otherwise.   Assessment/Plan:(ICD-10's: K48.52) 56 y.o.femalewith slowly resolving post-operative ileus, constipation, and GERD (since starting home BID Nexium + Ativan TID) without CT evidence of abscess, leak, or obstruction 8Days s/p exploratory laparotomy with lysis of adhesions and small bowel resection with primary anastomosisfor SBO attributed to focal  enteritis/scar/stenosisat site of recent small bowel (ileum) ischemia secondary to SMA thrombus attributed to aortic thrombus (no intracardiac thrombus on echo) s/p SMA stent placement and complicated by chronic severe constipation and comorbidities including obesity (BMI 40), adjustment disorder with generalized anxiety disorder, HTN, HLD, CAD with diastolic CHF, aortic thrombus, Bell's Palsy, and cancer not otherwise specified.  - pain control prn, minimize narcotics - continue to monitor abdominal exam and bowel function - soft diet with [employer] high protein shakes, refusing TPN  - appreciate medicine and psychiatry consultations/recommendations - agree with continue oral Eliquis anticoagulation - medical management of comorbidities - ambulation strongly encouraged  - discharge planning  All of the above findings and recommendations were discussed with the patient and several members of her family, and all of patient's and her family's questions were answered to their expressed satisfaction.  -- Marilynne Drivers Rosana Hoes, MD, [place]: [facility] - Partnering for exceptional care. Office: [phone]

## 2017-08-16 NOTE — Progress Notes (Signed)
[facility] NAME: Carla [name]    MR#:  [id number]  DATE OF BIRTH:  [birth year]  SUBJECTIVE:  CHIEF COMPLAINT:   Chief Complaint  Patient presents with  . Abdominal Pain   - Patient admitted for abdominal pain and had laparotomy and small bowel resection and adhesion of lysis, postoperative day 10 today. - medical consult was requested for chest pain yesterday- complains of heartburn - Continues to refuse TPN  REVIEW OF SYSTEMS:  Review of Systems  Constitutional: Positive for malaise/fatigue. Negative for chills and fever.  HENT: Negative for congestion, hearing loss and nosebleeds.   Eyes: Negative for blurred vision and double vision.  Respiratory: Negative for cough, shortness of breath and wheezing.   Cardiovascular: Negative for chest pain, palpitations and leg swelling.  Gastrointestinal: Positive for heartburn and nausea. Negative for abdominal pain, constipation, diarrhea and vomiting.  Genitourinary: Negative for dysuria.  Musculoskeletal: Negative for myalgias.  Neurological: Negative for dizziness, speech change, focal weakness, seizures and headaches.  Psychiatric/Behavioral: Negative for depression.    DRUG ALLERGIES:  No Known Allergies  VITALS:  Blood pressure 132/68, pulse 90, temperature 98.3 F (36.8 C), temperature source Oral, resp. rate 20, height 5\' 2"  (1.575 m), weight 101.2 kg (223 lb 3.2 oz), SpO2 96 %.  PHYSICAL EXAMINATION:  Physical Exam  GENERAL:  56 y.o.-year-old patient lying in the bed with no acute distress.  EYES: Pupils equal, round, reactive to light and accommodation. No scleral icterus. Extraocular muscles intact.  HEENT: Head atraumatic, normocephalic. Oropharynx and nasopharynx clear.  NECK:  Supple, no jugular venous distention. No thyroid enlargement, no tenderness.  LUNGS: Normal breath sounds bilaterally, no wheezing, rales,rhonchi or crepitation. No use of accessory muscles of  respiration.  CARDIOVASCULAR: S1, S2 normal. No murmurs, rubs, or gallops.  ABDOMEN: Soft abdomen, midline incision with dressing in place. Generalized tenderness with no guarding or rigidity. Nondistended. Bowel sounds present. No organomegaly or mass.  EXTREMITIES: No pedal edema, cyanosis, or clubbing.  NEUROLOGIC: Cranial nerves II through XII are intact. Muscle strength 5/5 in all extremities. Sensation intact. Gait not checked.  PSYCHIATRIC: The patient is alert and oriented x 3.  SKIN: No obvious rash, lesion, or ulcer.    LABORATORY PANEL:   CBC  Recent Labs Lab 08/13/17 0443  WBC 9.0  HGB 9.5*  HCT 29.3*  PLT 289   ------------------------------------------------------------------------------------------------------------------  Chemistries   Recent Labs Lab 08/16/17 0503  NA 137  K 4.8  CL 105  CO2 25  GLUCOSE 94  BUN 18  CREATININE 0.88  CALCIUM 7.9*  MG 2.5*  AST 14*  ALT 32  ALKPHOS 208*  BILITOT 0.2*   ------------------------------------------------------------------------------------------------------------------  Cardiac Enzymes  Recent Labs Lab 08/15/17 2217  TROPONINI 0.04*   ------------------------------------------------------------------------------------------------------------------  RADIOLOGY:  Dg Chest 2 View  Result Date: 08/15/2017 CLINICAL DATA:  Chest pain and lethargy EXAM: CHEST  2 VIEW COMPARISON:  08/09/2017 FINDINGS: Cardiac shadow is within normal limits. Right-sided PICC line is noted in satisfactory position. Very minimal atelectatic changes are noted in the bases. No sizable effusion is seen. Degenerative change of the thoracic spine is noted. IMPRESSION: Minimal bibasilar atelectasis. Right-sided PICC line in satisfactory position. Electronically Signed   By: Inez Catalina M.D.   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 Continue abdominal pain EXAM: CT ABDOMEN  AND PELVIS WITH CONTRAST TECHNIQUE: Multidetector CT imaging of the abdomen and pelvis was performed using the standard protocol following bolus administration of intravenous contrast. CONTRAST:  165mL ISOVUE-300 IOPAMIDOL (ISOVUE-300) INJECTION 61% COMPARISON:  CT 08/06/2017, small-bowel follow-through 10168 FINDINGS: Lower chest: Lung bases are clear. Hepatobiliary: No focal hepatic lesion. Postcholecystectomy. No biliary dilatation. Pancreas: Pancreas is normal. No ductal dilatation. No pancreatic inflammation. Spleen: Normal spleen Adrenals/urinary tract: Thickening of the LEFT adrenal gland similar prior. Kidneys normal. Ureters and bladder normal Stomach/Bowel: Stomach is normal. The duodenum is fluid-filled. The proximal small bowel is fluid-filled similar to comparison exam. Small bowel loops are upper limits of normal in diameter at 3.7 cm unchanged from prior. There is a bowel anastomosis in the RIGHT lower quadrant with inflammation in the adjacent mesenteries. No evidence of high-grade obstruction. There is contrast in the ascending colon from small bowel follow-through 08/07/2017 Colon rectosigmoid colon normal. Vascular/Lymphatic: Abdominal aorta is normal. Stent in the superior mesenteric artery. No adenopathy Reproductive: Post hysterectomy Other: Smaller free fluid the pelvis. Midline the surgical wound without complication. Musculoskeletal: No aggressive osseous lesion. IMPRESSION: 1. Findings most suggestive of small-bowel ileus. No evidence of high-grade obstruction. No pneumatosis or portal venous gas. 2. Contrast within the colon from prior barium study. 3. No acute findings otherwise. Electronically Signed   By: Suzy Bouchard M.D.   On: 08/15/2017 16:27    EKG:   Orders placed or performed during the hospital encounter of 08/06/17  . EKG 12-Lead  . EKG 12-Lead  . EKG 12-Lead  . 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  #1 chest pain-secondary to reflux disease -Stress test in the past have been negative, TEE and TTE were normal in August. -EKG without any new changes. - Troponins are plateaued - no further cardiac work up at this time - already on eliquis, less likely to be PE  #2 GERD- protonix BID If symptoms do not improve- please consult GI  #3 Recent SMA thrombus- s/p thrombectomy and thrombolysis - on eliquis -Currently status post adhesion lysis and partial small  bowel resection with end to end anastomosis - management per surgery - on TPN- refusing TPN  #4 HTN- hypertension  #5 DVT Prophylaxis- on eliquis  PT consult   Nothing else medically going on, will sign off Please consult if needed. Thanks.    All the records are reviewed and case discussed with Care Management/Social Workerr. Management plans discussed with the patient, family and they are in agreement.  CODE STATUS: Full Code  TOTAL TIME TAKING CARE OF THIS PATIENT: 37 minutes.     Jahni Paul M.D on 08/16/2017 at 12:46 PM  Between 7am to 6pm - Pager - [phone]  After 6pm go to [email or web address] - password [email or web address] [facility]  Office  [phone]  CC: Primary care physician; Patient, No Pcp Per

## 2017-08-16 NOTE — Consult Note (Signed)
[facility] Face-to-Face Psychiatry Consult   Reason for Consult: Consult for 56 year old woman with multiple medical problems currently recovering from bowel surgery and having trouble getting back into regular oral intake.  Consult for what appeared to possibly be exaggerated pain and distress Referring Physician:  Anselm Jungling Patient Identification: Carla [name] MRN:  [id number] Principal Diagnosis: Adjustment disorder with anxiety Diagnosis:   Patient Active Problem List   Diagnosis Date Noted  . Adjustment disorder with anxiety [F43.22] 08/15/2017  . Chronic idiopathic constipation [K59.04]   . Protein-calorie malnutrition, severe [E43] 08/08/2017  . SBO (small bowel obstruction) ([place]) [K56.609] 08/06/2017  . Chronic mesenteric ischemia ([place]) [K55.1]   . AKI (acute kidney injury) ([place]) [N17.9] 07/29/2017  . Hypotension [I95.9] 07/29/2017  . 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[place]) [K55.059]   . Abdominal pain [R10.9]   . Occlusive mesenteric ischemia ([place]) [K55.059] 06/19/2017  . 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 Acute focal ischemia of small intestine ([place]) [K55.011]   . Acute right-sided low back pain with right-sided sciatica [M54.41] 06/13/2017  . Primary osteoarthritis of both knees [M17.0] 06/13/2017  . Pain syndrome, chronic [G89.4] 09/13/2016  . Morbid obesity with BMI of 40.0-44.9, adult ([place]) [E66.01, Z68.41] 07/16/2016  . Personal history of other malignant neoplasm of skin [Z85.828] 06/01/2016  . Chronic pain of both knees [M25.561, M25.562, G89.29] 01/19/2016  . Essential hypertension with goal blood pressure less than 140/90 [I10] 01/19/2016  . Gastroesophageal reflux disease [K21.9] 01/19/2016  . Hyperlipidemia [E78.5] 01/19/2016  . Prediabetes [R73.03] 01/19/2016  . Recurrent major depressive disorder ([place]) [F33.9] 01/19/2016  . Urinary tract infection [N39.0] 01/09/2014  . Chronic pelvic pain in female [R10.2, G89.29] 11/14/2013  . 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 Chest pain [R07.9] 08/06/2012    Total Time spent with patient: 20 minutes  Subjective:   Carla [name] is a 56 y.o. female patient admitted with "I am throwing up".  Follow-up for 56 year old woman with abdominal complaints. Yesterday I had put her on standing doses of Ativan. Whether or not that contributed today it sounds like she has been feeling better. She has been able to keep down a little bit of by mouth. She was sleeping when I came in but was easily arousable and made eye contact and was able to carry on a brief conversation. No new complaints.  HPI: Patient interviewed chart reviewed.  Her sisters came in later in the interview also and mediation.  Patient was not a very willing or forthcoming historian.  Consult was requested because of perceived exaggerated complaints of pain or unrealistic complaints of pain being caused by parenteral nutrition.  On interview today I found the patient awake and responsive but unwilling to make much eye contact.  Looked at me very little.  Mostly kept her eyes shut.  Told me that she was still feeling sick to her stomach and throwing up all through the day.  She tells me that she is most concerned about the pain which she indicates is in her chest and abdomen where she had the surgery.  She also feels like she is having heartburn type pain that is worse when she tries to eat.  She denies having significant pain in any other part of her body but when I specifically ask did say that her legs hurt.  I was able to touch her gently on her extremities and did not elicit any kind of pain reaction.  Patient denies feeling depressed.  Denies feeling hopeless.  Denies any suicidal thoughts.  Admits that she is having a little difficulty sleeping.  Denies any hallucinations delirium or psychotic symptoms.  Patient has been prescribed as needed medicine for anxiety but has not been making use of it.  Family tells me  that her mother died just 2 weeks previously as well.  Also points out that the patient has been very stressed by pending knee surgery that was interrupted by now this abdominal surgery.  Social history: Lives at home with her husband.  There is family who are close with her.  Denies any social problems.  Medical history: Multiple and extensive medical problems.  Abdominal aortic atherosclerosis also thrombus status post recent serious abdominal surgery.  Past history of heart disease hyperlipidemia hypertension Bell's palsy  Substance abuse history: Nothing in the chart indicates any past concerns about substance abuse.  Patient denies alcohol or drug abuse.  Looking back over her prescriptions it appears that she has been prescribed tramadol and occasionally slightly stronger narcotics but has not been on regular doses of antianxiety medicines.  Past Psychiatric History: Patient denies ever seeing a doctor for mental health problems denies any history of depression.  Does admit that she has talked to doctors about anxiety in the past but will not go into any detail about it.  No history of hospitalization no history of suicide attempts.  Currently she is being offered 1/2 mg of alprazolam twice a day as needed but has not been taking it  Risk to Self: Is patient at risk for suicide?: No Risk to Others:   Prior Inpatient Therapy:   Prior Outpatient Therapy:    Past Medical History:  Past Medical History:  Diagnosis Date  . Abdominal aortic atherosclerosis ([place])    a. 05/2017 CTA abd/pelvis: significant atherosclerotic dzs of the infrarenal abd Ao w/ some mural thrombus. No aneurysm or dissection.  . Baker's cyst of knee, right    a. 07/2016 U/S: 4.1 x 1.4 x 2.9 cystic structure in R poplitetal fossa.  . Bell's palsy   . Cancer ([place])   . Chest pain    a. 08/2012 Lexiscan MV: EF 54%, non ischemia/infarct.  . Diastolic dysfunction    a. 05/2017 Echo: Ef 60-65%, no rwma, Gr1 DD, no source of  cardiac emboli.  . 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[place])    a. 05/2017 CTA Abd/pelvis: apparent thrombus or embolus in prox SMA (70-90%); b. 05/2017 catheter directed tPA, mechanical thrombectomy, and stenting of the SMA.  [name] GERD (gastroesophageal reflux disease)   . Hyperlipidemia   . Hypertension   . Morbid obesity with BMI of 40.0-44.9, adult [facility])     Past Surgical History:  Procedure Laterality Date  . CHOLECYSTECTOMY    . LAPAROTOMY N/A 08/08/2017   Procedure: EXPLORATORY LAPAROTOMY POSSIBLE BOWEL RESECTION;  Surgeon: Jules Husbands, MD;  Location: [facility] ORS;  Service: General;  Laterality: N/A;  . TEE WITHOUT CARDIOVERSION N/A 06/22/2017   Procedure: TRANSESOPHAGEAL ECHOCARDIOGRAM (TEE);  Surgeon: Wellington Hampshire, MD;  Location: [facility] ORS;  Service: Cardiovascular;  Laterality: N/A;  . VAGINAL HYSTERECTOMY    . VISCERAL ARTERY INTERVENTION N/A 06/20/2017   Procedure: Visceral Artery Intervention, possible aortic thrombectomy;  Surgeon: Algernon Huxley, MD;  Location: [place] CV LAB;  Service: Cardiovascular;  Laterality: N/A;   Family History:  Family History  Problem Relation Age of Onset  . Hypertension Mother   . Heart disease Mother   . Heart attack Mother   .  Breast cancer Mother   . Hypertension Father   . Breast cancer Paternal Aunt    Family Psychiatric  History: Denies any Social History:  History  Alcohol Use No     History  Drug Use No    Social History   Social History  . Marital status: Single    Spouse name: N/A  . Number of children: N/A  . Years of education: N/A   Occupational History  . Unemployed    Social History Main Topics  . Smoking status: Current Some Day Smoker    Packs/day: 0.50    Types: Cigarettes  . Smokeless tobacco: Never Used  . Alcohol use No  . Drug use: No  . Sexual activity: No   Other Topics Concern  . None   Social History Narrative   Lives in [place] with boyfriend.  Does not routinely exercise.    Additional Social History:    Allergies:  No Known Allergies  Labs:  Results for orders placed or performed during the hospital encounter of 08/06/17 (from the past 48 hour(s))  Glucose, capillary     Status: Abnormal   Collection Time: 08/15/17 12:33 AM  Result Value Ref Range   Glucose-Capillary 112 (H) 65 - 99 mg/dL  Glucose, capillary     Status: Abnormal   Collection Time: 08/15/17  3:21 AM  Result Value Ref Range   Glucose-Capillary 117 (H) 65 - 99 mg/dL  Potassium     Status: None   Collection Time: 08/15/17  3:38 AM  Result Value Ref Range   Potassium 4.7 3.5 - 5.1 mmol/L  Magnesium     Status: Abnormal   Collection Time: 08/15/17  3:38 AM  Result Value Ref Range   Magnesium 2.7 (H) 1.7 - 2.4 mg/dL  Phosphorus     Status: None   Collection Time: 08/15/17  3:38 AM  Result Value Ref Range   Phosphorus 4.3 2.5 - 4.6 mg/dL  Glucose, capillary     Status: Abnormal   Collection Time: 08/15/17  7:37 AM  Result Value Ref Range   Glucose-Capillary 111 (H) 65 - 99 mg/dL   Comment 1 Notify RN   Glucose, capillary     Status: None   Collection Time: 08/15/17 11:43 AM  Result Value Ref Range   Glucose-Capillary 98 65 - 99 mg/dL   Comment 1 Notify RN   Troponin I     Status: Abnormal   Collection Time: 08/15/17  2:57 PM  Result Value Ref Range   Troponin I 0.03 (HH) <0.03 ng/mL    Comment: CRITICAL RESULT CALLED TO, READ BACK BY AND VERIFIED WITH PHYLLIS BRIGGS 08/15/17 @ 1617  MLK   Glucose, capillary     Status: None   Collection Time: 08/15/17  4:18 PM  Result Value Ref Range   Glucose-Capillary 83 65 - 99 mg/dL  Glucose, capillary     Status: None   Collection Time: 08/15/17  7:55 PM  Result Value Ref Range   Glucose-Capillary 87 65 - 99 mg/dL  Troponin I     Status: Abnormal   Collection Time: 08/15/17 10:17 PM  Result Value Ref Range   Troponin I 0.04 (HH) <0.03 ng/mL    Comment: CRITICAL VALUE NOTED. VALUE IS CONSISTENT WITH PREVIOUSLY REPORTED/CALLED  VALUE / [place]  Glucose, capillary     Status: None   Collection Time: 08/16/17 12:22 AM  Result Value Ref Range   Glucose-Capillary 89 65 - 99 mg/dL  Glucose, capillary  Status: None   Collection Time: 08/16/17  3:48 AM  Result Value Ref Range   Glucose-Capillary 97 65 - 99 mg/dL  Comprehensive metabolic panel     Status: Abnormal   Collection Time: 08/16/17  5:03 AM  Result Value Ref Range   Sodium 137 135 - 145 mmol/L   Potassium 4.8 3.5 - 5.1 mmol/L   Chloride 105 101 - 111 mmol/L   CO2 25 22 - 32 mmol/L   Glucose, Bld 94 65 - 99 mg/dL   BUN 18 6 - 20 mg/dL   Creatinine, Ser 0.88 0.44 - 1.00 mg/dL   Calcium 7.9 (L) 8.9 - 10.3 mg/dL   Total Protein 5.9 (L) 6.5 - 8.1 g/dL   Albumin 2.0 (L) 3.5 - 5.0 g/dL   AST 14 (L) 15 - 41 U/L   ALT 32 14 - 54 U/L   Alkaline Phosphatase 208 (H) 38 - 126 U/L   Total Bilirubin 0.2 (L) 0.3 - 1.2 mg/dL   GFR calc non Af Amer >60 >60 mL/min   GFR calc Af Amer >60 >60 mL/min    Comment: (NOTE) The eGFR has been calculated using the CKD EPI equation. This calculation has not been validated in all clinical situations. eGFR's persistently <60 mL/min signify possible Chronic Kidney Disease.    Anion gap 7 5 - 15  Magnesium     Status: Abnormal   Collection Time: 08/16/17  5:03 AM  Result Value Ref Range   Magnesium 2.5 (H) 1.7 - 2.4 mg/dL  Phosphorus     Status: None   Collection Time: 08/16/17  5:03 AM  Result Value Ref Range   Phosphorus 3.9 2.5 - 4.6 mg/dL  Glucose, capillary     Status: None   Collection Time: 08/16/17  7:32 AM  Result Value Ref Range   Glucose-Capillary 85 65 - 99 mg/dL  Glucose, capillary     Status: None   Collection Time: 08/16/17 11:50 AM  Result Value Ref Range   Glucose-Capillary 87 65 - 99 mg/dL  Troponin I     Status: Abnormal   Collection Time: 08/16/17  3:18 PM  Result Value Ref Range   Troponin I 0.03 (HH) <0.03 ng/mL    Comment: CRITICAL VALUE NOTED. VALUE IS CONSISTENT WITH PREVIOUSLY REPORTED/CALLED  VALUE KLW  Glucose, capillary     Status: None   Collection Time: 08/16/17  5:31 PM  Result Value Ref Range   Glucose-Capillary 98 65 - 99 mg/dL    Current Facility-Administered Medications  Medication Dose Route Frequency Provider Last Rate Last Dose  . alum & mag hydroxide-simeth (MAALOX/MYLANTA) 200-200-20 MG/5ML suspension 15 mL  15 mL Oral Q6H PRN Vickie Epley, MD   15 mL at 08/15/17 1438  . apixaban (ELIQUIS) tablet 5 mg  5 mg Oral BID Caroleen Hamman F, MD   5 mg at 08/16/17 0936  . calcium carbonate (TUMS - dosed in mg elemental calcium) chewable tablet 200 mg of elemental calcium  200 mg of elemental calcium Oral BID PRN Piscoya, Jose, MD      . famotidine (PEPCID) tablet 20 mg  20 mg Oral BID Vickie Epley, MD   20 mg at 08/16/17 0936  . feeding supplement (ENSURE ENLIVE) (ENSURE ENLIVE) liquid 237 mL  237 mL Oral BID BM Vickie Epley, MD   237 mL at 08/16/17 1638  . gabapentin (NEURONTIN) capsule 400 mg  400 mg Oral TID Caroleen Hamman F, MD   400 mg at 08/16/17  1638  . insulin aspart (novoLOG) injection 0-15 Units  0-15 Units Subcutaneous TID AC & HS Ancil Linsey, MD      . LORazepam (ATIVAN) injection 0.5 mg  0.5 mg Intravenous Q8H Clapacs, John T, MD   0.5 mg at 08/16/17 1336  . metoprolol tartrate (LOPRESSOR) tablet 50 mg  50 mg Oral BID Lattie Haw, MD   50 mg at 08/16/17 5674  . multivitamin with minerals tablet 1 tablet  1 tablet Oral Daily Ancil Linsey, MD   1 tablet at 08/16/17 1638  . nicotine (NICODERM CQ - dosed in mg/24 hr) patch 7 mg  7 mg Transdermal Daily Ancil Linsey, MD      . omeprazole (PRILOSEC) capsule 20 mg  20 mg Oral BID AC Ancil Linsey, MD   20 mg at 08/16/17 1638  . ondansetron (ZOFRAN) tablet 4 mg  4 mg Oral Q6H PRN Lattie Haw, MD       Or  . ondansetron [facility]) injection 4 mg  4 mg Intravenous Q6H PRN Lattie Haw, MD   4 mg at 08/15/17 1044  . ondansetron (ZOFRAN) injection 4 mg  4 mg Intravenous Q6H Pabon,  Diego F, MD   4 mg at 08/16/17 1237  . oxyCODONE-acetaminophen (PERCOCET/ROXICET) 5-325 MG per tablet 1 tablet  1 tablet Oral Q4H PRN Ancil Linsey, MD   1 tablet at 08/16/17 1959  . polyethylene glycol (MIRALAX / GLYCOLAX) packet 17 g  17 g Oral BID Sterling Big F, MD   17 g at 08/14/17 2108  . promethazine (PHENERGAN) injection 12.5 mg  12.5 mg Intravenous Q6H PRN Piscoya, Jose, MD   12.5 mg at 08/15/17 2213  . senna-docusate (Senokot-S) tablet 1 tablet  1 tablet Oral QHS Ancil Linsey, MD   1 tablet at 08/14/17 2108  . sodium chloride flush (NS) 0.9 % injection 10-40 mL  10-40 mL Intracatheter Q12H Lattie Haw, MD   10 mL at 08/16/17 [id number]  . sodium chloride flush (NS) 0.9 % injection 10-40 mL  10-40 mL Intracatheter PRN Lattie Haw, MD        Musculoskeletal: Strength & Muscle Tone: decreased Gait & Station: unsteady Patient leans: N/A  Psychiatric Specialty Exam: Physical Exam  Nursing note and vitals reviewed. Constitutional: She appears well-developed.  HENT:  Head: Normocephalic and atraumatic.  Eyes: Pupils are equal, round, and reactive to light. Conjunctivae are normal.  Neck: Normal range of motion.  Cardiovascular: Normal heart sounds.   Respiratory: Effort normal. No respiratory distress.  GI: There is no tenderness.  Musculoskeletal: Normal range of motion.  Neurological: She is alert.  Skin: Skin is warm and dry.  Psychiatric: Her mood appears not anxious. Her affect is blunt. Her speech is delayed. She is withdrawn. Thought content is not paranoid. She expresses no homicidal and no suicidal ideation.    Review of Systems  Constitutional: Positive for malaise/fatigue and weight loss.  HENT: Negative.   Eyes: Negative.   Respiratory: Negative.   Cardiovascular: Negative.   Gastrointestinal: Negative for abdominal pain, nausea and vomiting.  Musculoskeletal: Negative.   Skin: Negative.   Neurological: Positive for weakness.   Psychiatric/Behavioral: Negative for depression, hallucinations, memory loss, substance abuse and suicidal ideas. The patient is not nervous/anxious and does not have insomnia.     Blood pressure (!) 141/81, pulse (!) 103, temperature 98.6 F (37 C), temperature source Oral, resp. rate 20, height 5\' 2"  (1.575 m), weight 223 lb  3.2 oz (101.2 kg), SpO2 100 %.Body mass index is 40.82 kg/m.  General Appearance: Casual  Eye Contact:  Fair  Speech:  Slow  Volume:  Decreased  Mood:  Euthymic  Affect:  Congruent  Thought Process:  Descriptions of Associations: Circumstantial  Orientation:  Full (Time, Place, and Person)  Thought Content:  Rumination  Suicidal Thoughts:  No  Homicidal Thoughts:  No  Memory:  Immediate;   Fair Recent;   Fair Remote;   Fair  Judgement:  Fair  Insight:  Shallow  Psychomotor Activity:  Decreased  Concentration:  Concentration: Fair  Recall:  [employer] of Knowledge:  Fair  Language:  Fair  Akathisia:  No  Handed:  Right  AIMS (if indicated):     Assets:  Housing Social Support  ADL's:  Impaired  Cognition:  Impaired,  Mild  Sleep:        Treatment Plan Summary: Daily contact with patient to assess and evaluate symptoms and progress in treatment, Medication management and Plan Intervention seems not to hurt anything. It sounds like she's been able to stay awake during the day. I would continue the Ativan dose for now. If she is going to be discharged to a rehabilitation-type facility I would probably recommend cutting the dose down to a quarter milligram 3 times a day or discontinuing it completely after transfer especially if she is going to be on oral medicine only. No change in the orders however for today. I will follow up tomorrow if needed.  Disposition: Patient does not meet criteria for psychiatric inpatient admission. Supportive therapy provided about ongoing stressors.  Alethia Berthold, MD 08/16/2017 8:27 PM

## 2017-08-16 NOTE — Progress Notes (Signed)
Nutrition Follow Up Note  DOCUMENTATION CODES:   Severe malnutrition in context of acute illness/injury  INTERVENTION:   TPN not appropriate at this point as pt's SBO resolved; if pt unable to meet estimated needs would recommend post pyloric NGT and enteral feeds.    Add Ensure Enlive po BID, each supplement provides 350 kcal and 20 grams of protein  MVI  Snacks   NUTRITION DIAGNOSIS:   Malnutrition (severe) related to acute illness (SBO) as evidenced by energy intake < or equal to 50% for > or equal to 5 days, 5 percent weight loss in 1 week.  GOAL:   Patient will meet greater than or equal to 90% of their needs   -not meeting   MONITOR:   PO intake, Supplement acceptance, Labs, Weight trends, I & O's  ASSESSMENT:   56 y.o. 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 CT scan in the emergency room suggest a bowel obstruction with a transition point in an area where prior ischemia was identified.   PICC Double Lumen 47/42/59 PICC Right Basilic 40 cm 2 cm   Met with pt and pt's sister in room today. Pt reports that she is feeling much better today. Pt reports that her reflux/heartburn is improved and she denies any nausea or vomiting today. Pt's sister present at bedside today. Sister brought in biscuits and gravy today for pt but pt had not yet gotten a chance to eat at time of RD visit. Pt refusing TPN as she believes this was making her heartburn worse. TPN is no longer appropriate for this pt as her SBO is resolved and she is able to take nutrition PO. If pt continues to have poor oral  intake; would recommend post pyloric NGT and enteral feeds. RD discussed with pt the importance of adequate protein and energy intake to preserve lean muscle and prevent ileus. Pt is willing to drink chocolate Ensure and eat snacks in order to avoid feeding tube. Pt also willing to eat foods brought from home; RD encouraged low fat high protein foods.          Medications reviewed and include: pepcid, insulin, MVI, omeprazole, nicotine, zofran,  miralax, senokot, oxycodone   Labs reviewed: Ca 7.9(L) adj. 9.5 wnl, P 3.9 wnl, Mg 2.5(H), AlkPhos 208(H), alb 2.0(L) Prealbumin- 10.7- 10/22 triglycerides 167(H)- 10/22  Diet Order:  DIET SOFT Room service appropriate? Yes; Fluid consistency: Thin  Skin:  Reviewed, no issues  Last BM:  10/25- type 7  Height:   Ht Readings from Last 1 Encounters:  08/06/17 '5\' 2"'$  (1.575 m)    Weight:   Wt Readings from Last 1 Encounters:  08/16/17 223 lb 3.2 oz (101.2 kg)    Ideal Body Weight:  50 kg  BMI:  Body mass index is 40.82 kg/m.  Estimated Nutritional Needs:   Kcal:  1700-2000kcal/day   Protein:  100-110g/day   Fluid:  >1.6L/day   EDUCATION NEEDS:   Education needs addressed  Koleen Distance MS, RD, LDN Pager #[phone] After Hours Pager: [phone]

## 2017-08-16 NOTE — Progress Notes (Signed)
PT Cancellation Note  Patient Details Name: Carla [name] MRN: [id number] DOB: 06/22/1961   Cancelled Treatment:    Reason Eval/Treat Not Completed: Patient declined, no reason specified. Pt stating not feeling very feel and having bouts of heartburn, will continue efforts as schedule permits.    Jasreet Dickie  Kasin Tonkinson, PTA 08/16/2017, 11:22 AM

## 2017-08-17 LAB — GLUCOSE, CAPILLARY
Glucose-Capillary: 95 mg/dL (ref 65–99)
Glucose-Capillary: 104 mg/dL — ABNORMAL HIGH (ref 65–99)

## 2017-08-17 MED ORDER — SENNOSIDES-DOCUSATE SODIUM 8.6-50 MG PO TABS: 2.0000 | ORAL_TABLET | Freq: Every evening | ORAL | Status: DC | PRN

## 2017-08-17 MED ORDER — ENSURE ENLIVE PO LIQD: 237.0000 mL | Freq: Three times a day (TID) | ORAL | 12 refills | Status: DC

## 2017-08-17 MED ORDER — ONDANSETRON HCL 4 MG PO TABS: 4.0000 mg | ORAL_TABLET | Freq: Four times a day (QID) | ORAL | 0 refills | Status: DC | PRN

## 2017-08-17 MED ORDER — OXYCODONE-ACETAMINOPHEN 5-325 MG PO TABS: 1.0000 | ORAL_TABLET | Freq: Four times a day (QID) | ORAL | 0 refills | Status: DC | PRN

## 2017-08-17 NOTE — Care Management (Signed)
Patient to discharge today.  Patient lives in a hotel with her husband (as had the last 6 years).  PCP Murthi at [facility].  Previous admission patient was set up with Medication Management  For Eliquis and her other medication.  Zofran rx faxed to medication management, and confirmed they have it in stock.  Patient is self pay.  PT assessed today and recommended home health PT. Patient ambulated 2 laps around the nursing station this morning, and has previously received home health PT visits paid for by [facility].  Patient already has walker in the home from previous admission.  Patient to discharge with dry dressings on abdomen.  Bedside RN to educate patient on dressing changes prior to discharge.  No home health services arranged at discharge.  RNCM signing off.

## 2017-08-17 NOTE — Discharge Instructions (Signed)
In addition to included general post-operative instructions for Small Bowel Resection,  Diet: Resume home heart healthy diet.   Activity: No heavy lifting >20 pounds (children, pets, laundry, garbage) or strenuous activity until follow-up, but light activity and walking are encouraged. Do not drive or drink alcohol if taking narcotic pain medications.  Wound care: Apply and change dry gauze dressing over post-surgical abdominal wound Daily + as needed if small amount pink drainage continues. You may continue to shower/get incision wet with soapy water and pat dry (do not rub incisions), but no baths or submerging incision underwater until follow-up.   Medications: Resume all home medications. For mild to moderate pain: acetaminophen (Tylenol) or ibuprofen (if no kidney disease). Combining Tylenol with alcohol can substantially increase your risk of causing liver disease. Narcotic pain medications, if prescribed, can be used for severe pain, though may cause nausea, constipation, and drowsiness. Do not combine Tylenol and Percocet within a 6 hour period as Percocet contains Tylenol. If you do not need the narcotic pain medication, you do not need to fill the prescription. A prescription for antinausea medicine will be provided as needed, but you should expect nausea will continue to improve without needing medication as you continue to recover.  Call office [phone]) at any time if any questions, worsening pain, fevers/chills, bleeding, drainage from incision site, or other concerns.

## 2017-08-17 NOTE — Discharge Summary (Signed)
Physician Discharge Summary  Patient ID: HABIBA TRELOAR MRN: [id number] DOB/AGE: [birth year] 56 y.o.  Admit date: 08/06/2017 Discharge date: 08/17/2017  Admission Diagnoses:  Discharge Diagnoses:  Principal Problem:   Adjustment disorder with anxiety Active Problems:   SBO (small bowel obstruction) (HCC)   Protein-calorie malnutrition, severe   Discharged Condition: good  Hospital Course: 56 y.o. 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 Informed consent was obtained and documented, and patient underwent uneventful open small bowel resection with primary anastomosis ([place], 08/08/2017).  Post-operatively, patient experienced a prolonged post-operative ileus, for which CT was repeated on POD#7, which demonstrated no additional findings. Patient's ileus then resolved, but she then reported CP, for which medical consultation and workup were obtained, after which psychiatry consultation was advised and requested. Patient's PO Protonix was switched to her home Prilosec for GERD. Patient's pain, nausea, and GERD then improved/resolved, and advancement of patient's diet and ambulation were well-tolerated. The remainder of patient's hospital course was essentially unremarkable, and discharge planning was initiated accordingly with patient safely able to be discharged home with appropriate discharge instructions, pain control and antiemetic, home PT, and outpatient surgical follow-up after all of her and family's questions were answered to their expressed satisfaction.  Consults: psychiatry and hospitalist  Significant Diagnostic Studies: radiology: CT + upper GI with  small bowel follow-through: mild SBO due to post-ischemic small bowel stricture  Treatments: IV hydration, surgery: open small bowel resection with primary anastomosis, and bowel regimen for severe chronic constipation  Discharge Exam: Blood pressure 114/62, pulse 83, temperature 98.1 F (36.7 C), temperature source Oral, resp. rate 18, height 5\' 2"  (1.575 m), weight 221 lb (100.2 kg), SpO2 98 %. General appearance: alert, cooperative, appears older than stated age, no distress and moderately obese Resp: clear to auscultation bilaterally and breathing non-labored on room air at rest or with ambulation Cardio: regular rate and rhythm, S1, S2 normal, no murmur, click, rub or gallop GI: soft, obese, and not visibly distended with mild peri-incisional tenderness to palpation, incision well-approximated with no drainage able to be expressed and dressing c/d/i (despite RN reporting staining overnight), no peri-incisional erythema  Disposition: 01-Home or Self Care   Allergies as of 08/17/2017   No Known Allergies     Medication List    STOP taking these medications   cephALEXin 500 MG capsule Commonly known as:  KEFLEX     TAKE these medications   acetaminophen 325 MG tablet Commonly known as:  TYLENOL Take 2 tablets (650 mg total) by mouth every 6 (six) hours as needed for mild pain (or Fever >/= 101).   ALPRAZolam 0.5 MG tablet Commonly known as:  XANAX Take 1 tablet (0.5 mg total) by mouth 2 (two) times daily as needed for anxiety.   apixaban 5 MG Tabs tablet Commonly known as:  ELIQUIS Take 1 tablet (5 mg total) by mouth 2 (two) times daily.   atorvastatin 80 MG tablet Commonly known as:  LIPITOR Take 80 mg by mouth at bedtime.   feeding supplement (ENSURE ENLIVE) Liqd Take 237 mLs by mouth 3 (three) times daily between meals.   gabapentin 300 MG capsule Commonly known as:  NEURONTIN Take 300 mg by mouth 2 (two) times daily.  lisinopril 40 MG tablet Commonly known  as:  PRINIVIL,ZESTRIL Take 40 mg by mouth daily.   metoprolol tartrate 50 MG tablet Commonly known as:  LOPRESSOR Take 50 mg by mouth 2 (two) times daily.   NEXIUM 40 MG capsule Generic drug:  esomeprazole Take 40 mg by mouth 2 (two) times daily before a meal.   nicotine 14 mg/24hr patch Commonly known as:  NICODERM CQ - dosed in mg/24 hours Place 1 patch (14 mg total) onto the skin daily.   ondansetron 4 MG tablet Commonly known as:  ZOFRAN Take 1 tablet (4 mg total) by mouth every 6 (six) hours as needed for nausea.   oxybutynin 5 MG tablet Commonly known as:  DITROPAN Take 1 tablet by mouth 3 (three) times daily as needed.   oxyCODONE-acetaminophen 5-325 MG tablet Commonly known as:  PERCOCET/ROXICET Take 1 tablet by mouth every 6 (six) hours as needed for severe pain.   polyethylene glycol packet Commonly known as:  MIRALAX / GLYCOLAX Take 17 g by mouth daily.   senna-docusate 8.6-50 MG tablet Commonly known as:  Senokot-S Take 2 tablets by mouth at bedtime as needed for mild constipation. What changed:  when to take this  reasons to take this   traMADol 50 MG tablet Commonly known as:  ULTRAM Take 50 mg by mouth every 6 (six) hours as needed for moderate pain.   Vitamin D (Ergocalciferol) 50000 units Caps capsule Commonly known as:  DRISDOL Take 1 capsule by mouth once a week.      Follow-up Information    Jules Husbands, MD. Go on 08/27/2017.   Specialty:  General Surgery Why:  Monday at 10:45am for hospital follow-up Contact information: [address] [place] [address] [phone]           Signed: Vickie Epley 08/17/2017, 2:57 PM

## 2017-08-17 NOTE — Progress Notes (Signed)
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 Pt complaints of lower abdominal pain and not passing gas or having a BM since last week. On 10/17 had exploratory laparotomy and possible bowel resection.    PT Comments    Pt presented in bed indicating urgency in use of BSC. Pt required no assist in rolling to EOB but required minA for truncal support. After use of BSC (+BM), pt able to maintain good balance with RW while PTA adjusted brief. Pt agreeable to ambulate to recliner then indicated that she has been having BM all am. Nsg entered and verified this info, also noted that pt ambulated this am (2 laps around unit). Pt agreeable to gentle seated therex as noted below. Pt indicating increased fatigue which she thinks is due to frequent BM. Pt remained in recliner at end of session with phone within reach and current needs met.    Follow Up Recommendations  Home health PT     Equipment Recommendations  Rolling walker with 5" wheels    Recommendations for Other Services       Precautions / Restrictions Precautions Precautions: Fall Restrictions Weight Bearing Restrictions: No    Mobility  Bed Mobility Overal bed mobility: Needs Assistance Bed Mobility: Supine to Sit     Supine to sit: Min assist     General bed mobility comments: required minA for truncal support  Transfers Overall transfer level: Needs assistance Equipment used: Rolling walker (2 wheeled) Transfers: Sit to/from Stand Sit to Stand: Min guard         General transfer comment: min guard on/off BSC, decreased eccentric control noted with sitting  Ambulation/Gait Ambulation/Gait assistance: Min guard Ambulation Distance (Feet): 15 Feet Assistive device:  Rolling walker (2 wheeled) Gait Pattern/deviations: Step-through pattern     General Gait Details: ambulated within room only   Stairs            Wheelchair Mobility    Modified Rankin (Stroke Patients Only)       Balance Overall balance assessment: Needs assistance Sitting-balance support: Feet supported Sitting balance-[name]: Good Sitting balance - Comments: observed while on BSC and in recliner   Standing balance support: Bilateral upper extremity supported Standing balance-[name]: Good Standing balance comment: use of RW                            Cognition Arousal/Alertness: Awake/alert Behavior During Therapy: Anxious Overall Cognitive Status: Within Functional Limits for tasks assessed                                        Exercises General Exercises - Lower Extremity Ankle Circles/Pumps: Strengthening;Both;10 reps;Seated Long Arc Quad: AROM;Strengthening;Both;10 reps;Seated Hip ABduction/ADduction: AROM;Strengthening;Both;10 reps;Seated Hip Flexion/Marching: AROM;Strengthening;Both;10 reps;Seated Other Exercises Other Exercises: manually resisted HS pulls x 10 bilaterally    General Comments        Pertinent Vitals/Pain Pain Assessment: Faces Faces Pain Scale: Hurts little more Pain Location: stomach Pain Intervention(s): Limited activity within patient's tolerance;Monitored during session    Home Living  Prior Function            PT Goals (current goals can now be found in the care plan section) Acute Rehab PT Goals Patient Stated Goal: to walk 2 laps tomorrow PT Goal Formulation: With patient Potential to Achieve Goals: Good Progress towards PT goals: Progressing toward goals    Frequency    Min 2X/week      PT Plan Current plan remains appropriate    Co-evaluation              AM-PAC PT "6 Clicks" Daily Activity  Outcome Measure  Difficulty turning  over in bed (including adjusting bedclothes, sheets and blankets)?: A Little Difficulty moving from lying on back to sitting on the side of the bed? : Unable Difficulty sitting down on and standing up from a chair with arms (e.g., wheelchair, bedside commode, etc,.)?: A Little Help needed moving to and from a bed to chair (including a wheelchair)?: A Little Help needed walking in hospital room?: A Little Help needed climbing 3-5 steps with a railing? : A Little 6 Click Score: 16    End of Session   Activity Tolerance: Patient tolerated treatment well Patient left: in chair;with call bell/phone within reach Nurse Communication: Mobility status PT Visit Diagnosis: Other abnormalities of gait and mobility (R26.89)     Time: 8833-7445 PT Time Calculation (min) (ACUTE ONLY): 16 min  Charges:  $Therapeutic Exercise: 8-22 mins                      Valla Pacey  Kamill Fulbright, PTA 08/17/2017, 1:43 PM

## 2017-08-17 NOTE — Progress Notes (Signed)
Carla [name] to be D/C'd home per MD order.  Discussed prescriptions and follow up appointments with the patient. Prescriptions given to patient, medication list explained in detail. Pt verbalized understanding.  Allergies as of 08/17/2017   No Known Allergies     Medication List    STOP taking these medications   cephALEXin 500 MG capsule Commonly known as:  KEFLEX     TAKE these medications   acetaminophen 325 MG tablet Commonly known as:  TYLENOL Take 2 tablets (650 mg total) by mouth every 6 (six) hours as needed for mild pain (or Fever >/= 101).   ALPRAZolam 0.5 MG tablet Commonly known as:  XANAX Take 1 tablet (0.5 mg total) by mouth 2 (two) times daily as needed for anxiety.   apixaban 5 MG Tabs tablet Commonly known as:  ELIQUIS Take 1 tablet (5 mg total) by mouth 2 (two) times daily.   atorvastatin 80 MG tablet Commonly known as:  LIPITOR Take 80 mg by mouth at bedtime.   feeding supplement (ENSURE ENLIVE) Liqd Take 237 mLs by mouth 3 (three) times daily between meals.   gabapentin 300 MG capsule Commonly known as:  NEURONTIN Take 300 mg by mouth 2 (two) times daily.   lisinopril 40 MG tablet Commonly known as:  PRINIVIL,ZESTRIL Take 40 mg by mouth daily.   metoprolol tartrate 50 MG tablet Commonly known as:  LOPRESSOR Take 50 mg by mouth 2 (two) times daily.   NEXIUM 40 MG capsule Generic drug:  esomeprazole Take 40 mg by mouth 2 (two) times daily before [name] meal.   nicotine 14 mg/24hr patch Commonly known as:  NICODERM CQ - dosed in mg/24 hours Place 1 patch (14 mg total) onto the skin daily.   ondansetron 4 MG tablet Commonly known as:  ZOFRAN Take 1 tablet (4 mg total) by mouth every 6 (six) hours as needed for nausea.   oxybutynin 5 MG tablet Commonly known as:  DITROPAN Take 1 tablet by mouth 3 (three) times daily as needed.   oxyCODONE-acetaminophen 5-325 MG tablet Commonly known as:  PERCOCET/ROXICET Take 1 tablet by mouth every 6 (six)  hours as needed for severe pain.   polyethylene glycol packet Commonly known as:  MIRALAX / GLYCOLAX Take 17 g by mouth daily.   senna-docusate 8.6-50 MG tablet Commonly known as:  Senokot-S Take 2 tablets by mouth at bedtime as needed for mild constipation. What changed:  when to take this  reasons to take this   traMADol 50 MG tablet Commonly known as:  ULTRAM Take 50 mg by mouth every 6 (six) hours as needed for moderate pain.   Vitamin D (Ergocalciferol) 50000 units Caps capsule Commonly known as:  DRISDOL Take 1 capsule by mouth once [name] week.       Vitals:   08/17/17 1050 08/17/17 1352  BP: 124/68 114/62  Pulse: 94 83  Resp: 20 18  Temp:  98.1 F (36.7 C)  SpO2: 96% 98%    Skin clean, dry and intact without evidence of skin break down, no evidence of skin tears noted. IV catheter discontinued intact. Site without signs and symptoms of complications. Dressing and pressure applied. Pt denies pain at this time. No complaints noted.  An After Visit Summary was printed and given to the patient. Patient escorted via [place], and D/C home via private auto.  Carla [name]

## 2017-08-20 ENCOUNTER — Telehealth: Payer: Self-pay

## 2017-08-20 NOTE — Telephone Encounter (Signed)
Post-op call made to patient at this time. Spoke with patient. Post-op interview questions below.  1. How are you feeling? Not too good.Patient stated that she has been feeling bad. She stated that she has redness on her umbilical incision and is draining all the time.   2. Is your pain controlled? Not at this time.  3. What are you doing for the pain? Taking the pain medication that is not currently helping her at all.  4. Are you having any Nausea or Vomiting? No  5. Are you having any Fever or Chills? No  6. Are you having any Constipation or Diarrhea? No  7. Is there any Swelling or Bruising you are concerned about? No  8. Do you have any questions or concerns at this time?  Yes.   Discussion: Due to how she has been feeling, I recommended for her to go to the ED today and be seen by our doctor on call. However, patient declined to go to the ED. I then tried to schedule her an appointment tomorrow morning and she also declined stating that she did not have a ride until Wednesday. I told her that we needed to see her to make sure that her incisions were not getting infected. However, patient stated that she could come in until Wednesday. I also recommended for her to take Ibuprofen for her pain and she declined stating that if the narcotic was not touching the pain, then why would the Ibuprofen help. I told her that it was to help her the inflammation that she was having. patient still declined taking it.  I scheduled her an appointment for Wednesday 08/22/2017 at 3:00 PM with Dr. Hampton Abbot. Patient agreed to come then.

## 2017-08-21 ENCOUNTER — Other Ambulatory Visit: Payer: Self-pay

## 2017-08-22 ENCOUNTER — Ambulatory Visit (INDEPENDENT_AMBULATORY_CARE_PROVIDER_SITE_OTHER): Payer: Self-pay | Admitting: Surgery

## 2017-08-22 ENCOUNTER — Encounter: Payer: Self-pay | Admitting: Surgery

## 2017-08-22 VITALS — BP 126/86 | HR 75 | Temp 97.5°F | Ht 62.0 in | Wt 211.4 lb

## 2017-08-22 DIAGNOSIS — Z09 Encounter for follow-up examination after completed treatment for conditions other than malignant neoplasm: Secondary | ICD-10-CM

## 2017-08-22 MED ORDER — IBUPROFEN 600 MG PO TABS: 600.0000 mg | ORAL_TABLET | Freq: Three times a day (TID) | ORAL | 0 refills | Status: DC | PRN

## 2017-08-22 MED ORDER — AMOXICILLIN-POT CLAVULANATE 875-125 MG PO TABS: 1.0000 | ORAL_TABLET | Freq: Two times a day (BID) | ORAL | Status: DC

## 2017-08-22 NOTE — Progress Notes (Signed)
08/22/2017  HPI: Patient is s/p exploratory laparotomy with small bowel resection with Dr. Dahlia Byes on 10/17 for a stenotic lesion within ileum due to prior mesenteric ischemia.  She was discharged to home on 10/26 after a prolonged stay due to abdominal pain and nausea.  She presents today for follow up.  Overall, the patient reports that she continues to have pain, particularly in the right lower quadrant, that is constant in nature.  She also reports incisional pain and some bloody drainage from the lower portion of her wound.  Denies any fevers or chills and reports eating well without any constipation or diarrhea or worsening pain with po intake.  Vital signs: BP 126/86   Pulse 75   Temp (!) 97.5 F (36.4 C) (Oral)   Ht 5\' 2"  (1.575 m)   Wt 95.9 kg (211 lb 6.4 oz)   BMI 38.67 kg/m    Physical Exam: Constitutional: No acute distress Abdomen:  Soft, obese, nondistended.  She has tenderness that's referred to the right lower quadrant even with palpation on the left side or epigastric areas.  She does have some incisional pain particularly at the lower portion of the wound.  There is no active drainage and the overlying skin appears well approximated, but there is some erythema surrounding the edges that is worse at the lower portion of the wound compared to the top of the wound.  Three staples were removed to try to probe the wound with q-tip, but no separation was noted or made.  Remaining staples were left in place.  Assessment/Plan: 56 yo female s/p exploratory laparotomy with small bowel resection  --Discussed with patient that she could be having some low grade wound infection.  Will prescribe Augmentin for now and reassess next week.  If her wound has improved by then, then will be able to remove her staples. --For her pain, will give her an abdominal binder to help decrease the strain and tension on the incision itself and prescribe Motrin 600 mg q8hr prn.  She mentioned that toradol  worked very well while in the hospital. She will also continue her Percocet that was prescribed upon discharge. --She will follow up next week with Dr. Dahlia Byes to reassess her wound and pain.  If there is no improvement, she may need labs and/or CT scan to further assess. --Return precautions have been given.  Patient understands this plan and all of her questions have been answered.   Melvyn Neth, [place]

## 2017-08-22 NOTE — Patient Instructions (Addendum)
We have removed some of the staples and applied steri-strips. These will begin to fall off in 7-10 days. You will need to continue to keep a dressing over the area for the drainage you are having.  Please pick up your medicine at the pharmacy.  Please see your follow up appointment listed below.  We have provided an Abdominal binder for you today to wear.

## 2017-08-27 ENCOUNTER — Encounter: Payer: Self-pay | Admitting: Surgery

## 2017-08-27 ENCOUNTER — Telehealth: Payer: Self-pay | Admitting: Pharmacist

## 2017-08-27 ENCOUNTER — Other Ambulatory Visit: Payer: Self-pay

## 2017-08-27 MED ORDER — AMOXICILLIN-POT CLAVULANATE 875-125 MG PO TABS: 1.0000 | ORAL_TABLET | Freq: Two times a day (BID) | ORAL | 0 refills | Status: DC

## 2017-08-27 NOTE — Telephone Encounter (Signed)
08/27/17 Faxed [facility] application for Eliquis 5mg  Take 1 tablet two times a day.Carla [name]

## 2017-08-29 ENCOUNTER — Encounter: Payer: Self-pay | Admitting: Surgery

## 2017-08-29 ENCOUNTER — Ambulatory Visit (INDEPENDENT_AMBULATORY_CARE_PROVIDER_SITE_OTHER): Payer: Self-pay | Admitting: Surgery

## 2017-08-29 ENCOUNTER — Encounter: Payer: Self-pay | Admitting: General Surgery

## 2017-08-29 VITALS — BP 134/85 | HR 87 | Temp 98.0°F | Ht 62.0 in | Wt 207.0 lb

## 2017-08-29 DIAGNOSIS — Z09 Encounter for follow-up examination after completed treatment for conditions other than malignant neoplasm: Secondary | ICD-10-CM

## 2017-08-29 NOTE — Progress Notes (Signed)
S/p lap and SB resection She has chronic pain and taken care of her has been very challenging. She seems to have turned the corner Continues to smoke    PE NAD Abd: staples removed. , incision no infection. There is only erythema at the insertion point of the staples, likely from reaction to the metal No peritonitis  A/p Doing well RTC 2-3 weeks No complications

## 2017-08-29 NOTE — Patient Instructions (Signed)
Today we have removed your staples and placed steri stripes. These are placed to reinforce the wound. These will fall off in about 2-3 weeks. DO NOT pull them off. Try to keep this are dry at all times. It is okay to shower and just be sure to get pat the area dry.  Continue and complete your antibiotics.  We will see you back in office as listed below:

## 2017-09-20 ENCOUNTER — Encounter: Payer: Self-pay | Admitting: Surgery

## 2017-09-20 ENCOUNTER — Encounter: Payer: Self-pay | Admitting: Pharmacist

## 2017-09-25 ENCOUNTER — Encounter: Payer: Self-pay | Admitting: Surgery

## 2017-09-25 ENCOUNTER — Telehealth: Payer: Self-pay | Admitting: Surgery

## 2017-09-25 DIAGNOSIS — M179 Osteoarthritis of knee, unspecified: Secondary | ICD-10-CM | POA: Insufficient documentation

## 2017-09-25 DIAGNOSIS — M1710 Unilateral primary osteoarthritis, unspecified knee: Secondary | ICD-10-CM | POA: Insufficient documentation

## 2017-09-25 DIAGNOSIS — M5137 Other intervertebral disc degeneration, lumbosacral region: Secondary | ICD-10-CM | POA: Insufficient documentation

## 2017-09-25 DIAGNOSIS — M51379 Other intervertebral disc degeneration, lumbosacral region without mention of lumbar back pain or lower extremity pain: Secondary | ICD-10-CM | POA: Insufficient documentation

## 2017-09-25 NOTE — Telephone Encounter (Signed)
Left a message for the patient to call the office to reschedule her no showed appointment with Dr. Dahlia Byes on 09/20/2017. I have also mailed a letter to the patient to have the her call and reschedule this appointment.please reschedule when patient calls.

## 2017-11-13 ENCOUNTER — Encounter (INDEPENDENT_AMBULATORY_CARE_PROVIDER_SITE_OTHER): Payer: Self-pay

## 2017-12-28 ENCOUNTER — Telehealth: Payer: Self-pay | Admitting: Pharmacy Technician

## 2017-12-28 NOTE — Telephone Encounter (Signed)
Patient failed to provide 2019 financial documentation.  No additional medication assistance will be provided by [facility] without the required proof of income documentation.  Patient notified by letter.  Carla [name] Carla [name] Care Manager [facility] 

## 2018-01-06 IMAGING — CT CT HEAD W/O CM
3 series · 15 of 46 positions shown, 18 images · non-contrast
Comparison: Head CT without contrast 10/06/2013. Brain MRI
07/22/2012.

CLINICAL DATA: Code stroke. 55-year-old female with slurred speech
since 9444 hours yesterday. Initial encounter.

EXAM:
CT HEAD WITHOUT CONTRAST
TECHNIQUE: Contiguous axial images were obtained from the base of the skull
through the vertex without intravenous contrast.

[Series 2: head wo · axial · 0.42mm/px · z∈[-43,+77]mm · 9 of 29 slices shown, 12 images]
[im 3/29  brain]
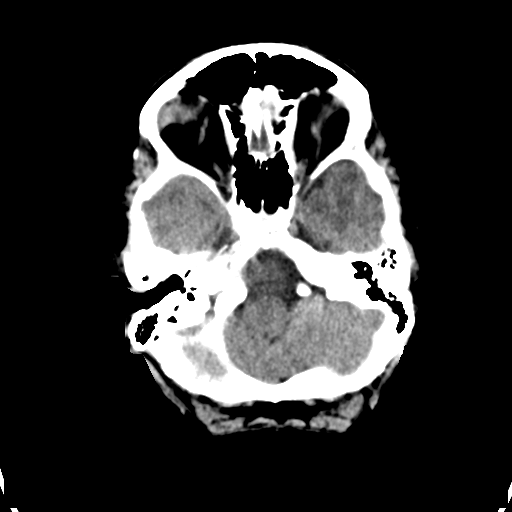
[im 3/29  bone]
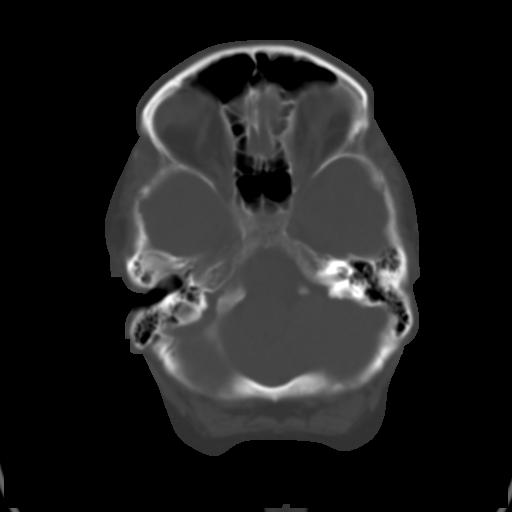
[im 6/29  brain]
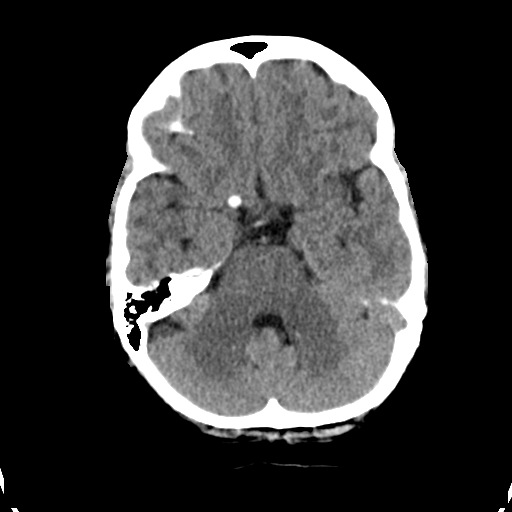
[im 9/29  brain]
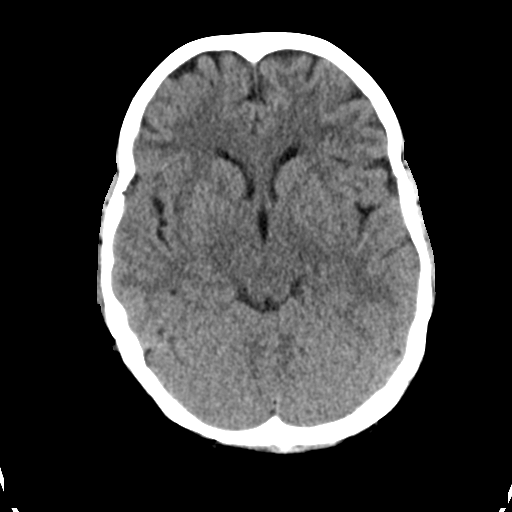
[im 12/29  brain]
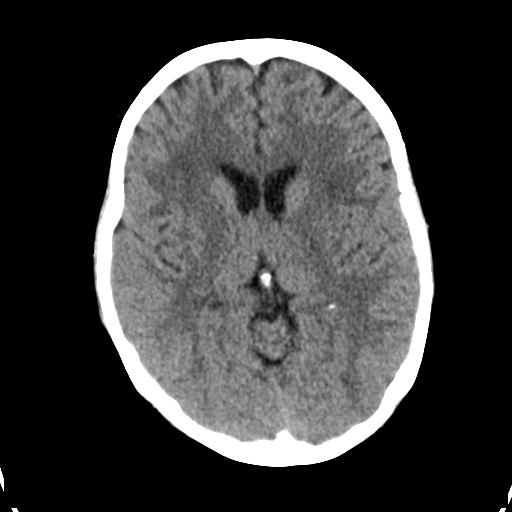
[im 15/29  brain]
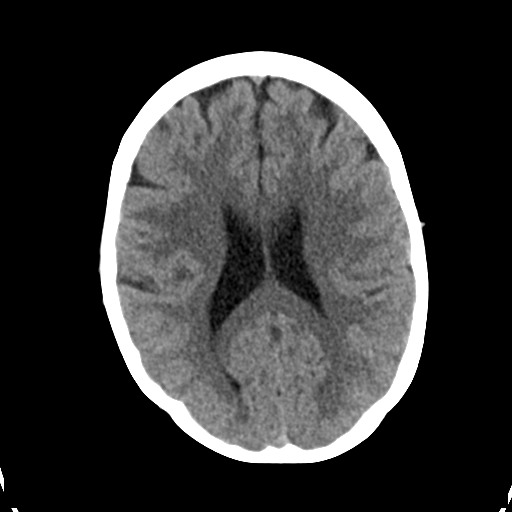
[im 15/29  bone]
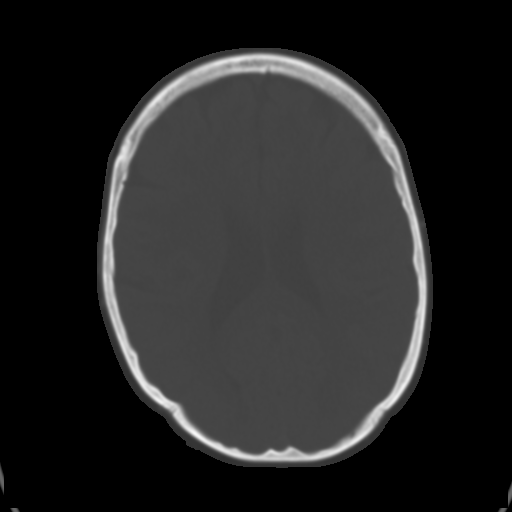
[im 18/29  brain]
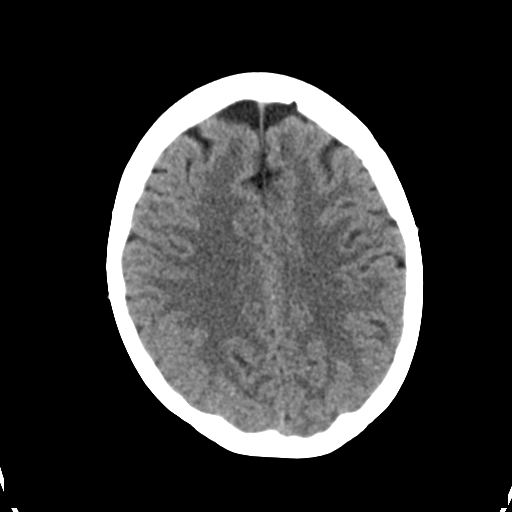
[im 21/29  brain]
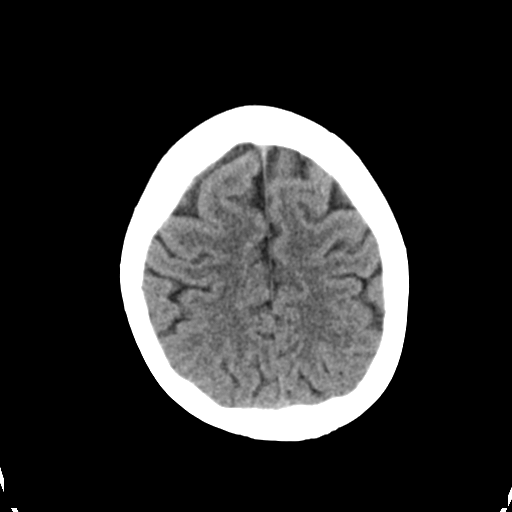
[im 24/29  brain]
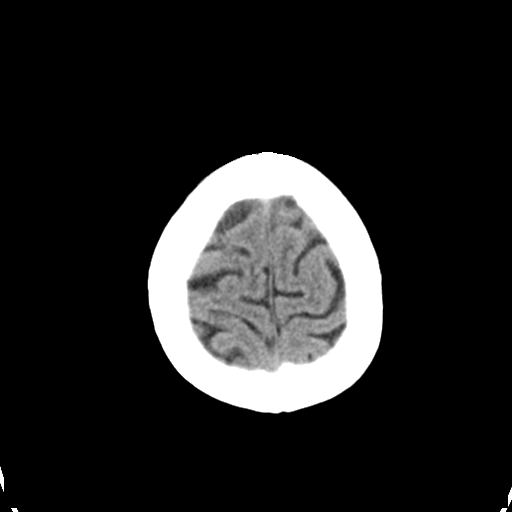
[im 27/29  brain]
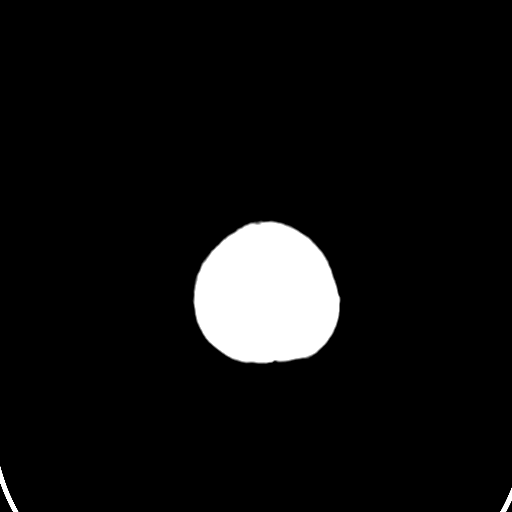
[im 27/29  bone]
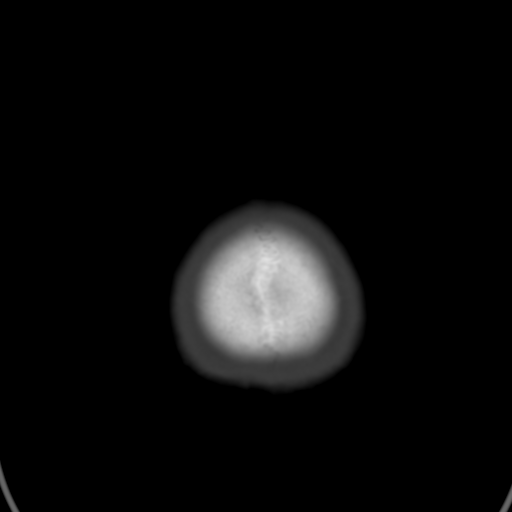

[Series 4: coronal soft tissue · coronal · 0.28mm/px · 3 of 66 slices shown]
[im 22/66  brain]
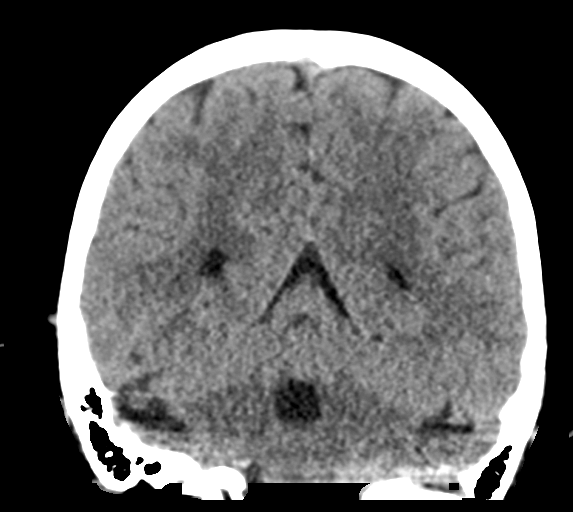
[im 29/66  brain]
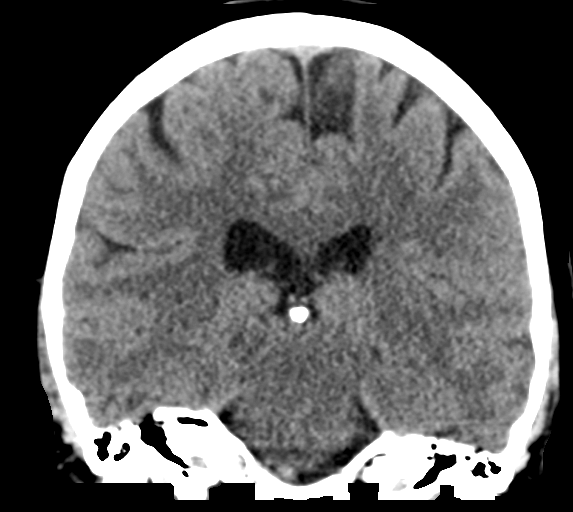
[im 37/66  brain]
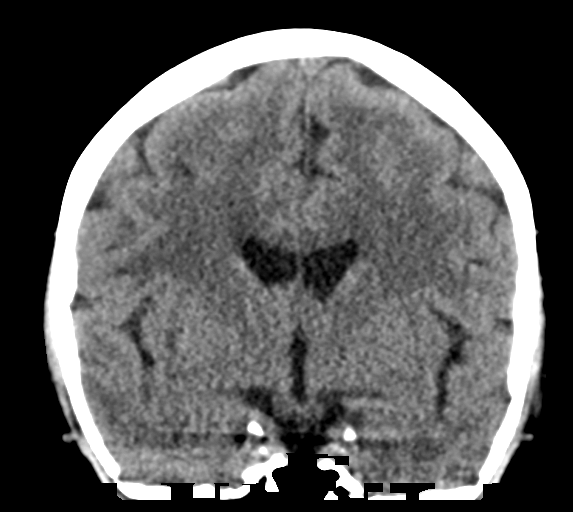

[Series 5: sagittal soft tissue · sagittal · 0.28mm/px · 3 of 52 slices shown]
[im 18/52  brain]
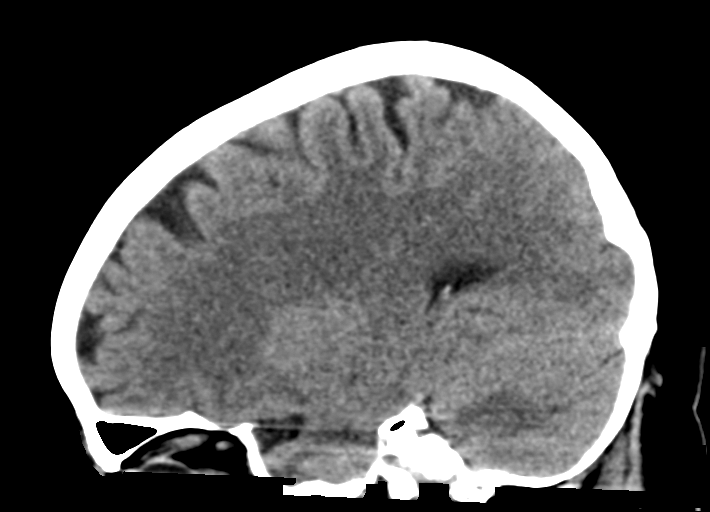
[im 26/52  brain]
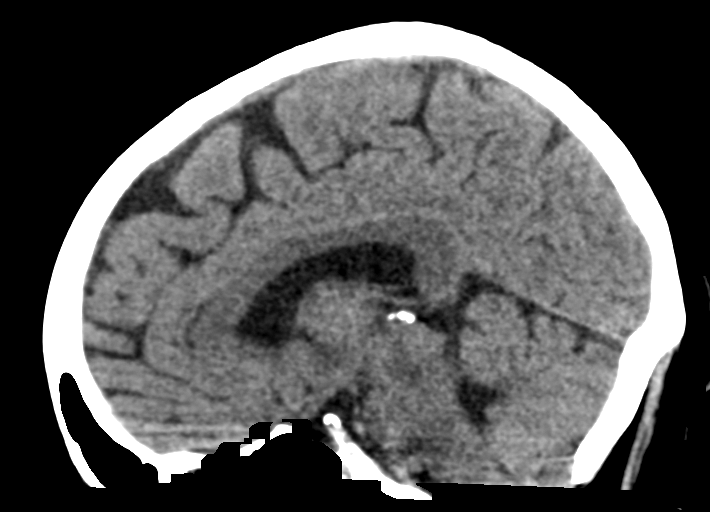
[im 35/52  brain]
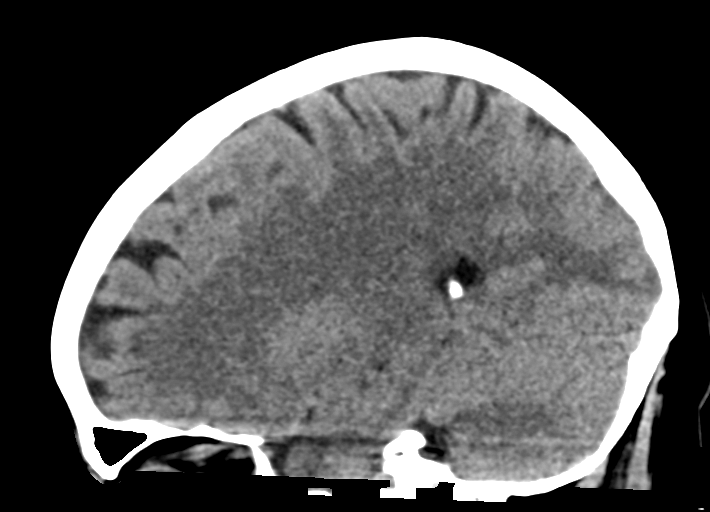

[15 of 46 positions shown; findings below may reference images not displayed]

FINDINGS: Brain: Stable cerebral volume. No midline shift, ventriculomegaly,
mass effect, evidence of mass lesion, intracranial hemorrhage or
evidence of cortically based acute infarction. Gray-white matter
differentiation is within normal limits throughout the brain.

Vascular: No suspicious intracranial vascular hyperdensity. Mild
Calcified atherosclerosis at the skull base.

Skull: No osseous abnormality identified.

Sinuses/Orbits: Visualized paranasal sinuses and mastoids are stable
and well pneumatized.

Other: Negative visualized orbit and scalp soft tissues.

ASPECTS (Alberta Stroke Program Early CT Score)

Total score (0-10 with 10 being normal): 10.
IMPRESSION: 1. Stable and normal noncontrast CT appearance of the brain.
2. ASPECTS is 10.
3. Study discussed by telephone with Dr. YI SCHORR on

## 2018-01-17 IMAGING — CR DG FINGER INDEX 2+V*R*
3 series · 3 of 3 positions shown · non-contrast
Comparison: None.

CLINICAL DATA: Right second finger pain after multiple falls.

EXAM:
RIGHT INDEX FINGER 2+V

[finger ap]
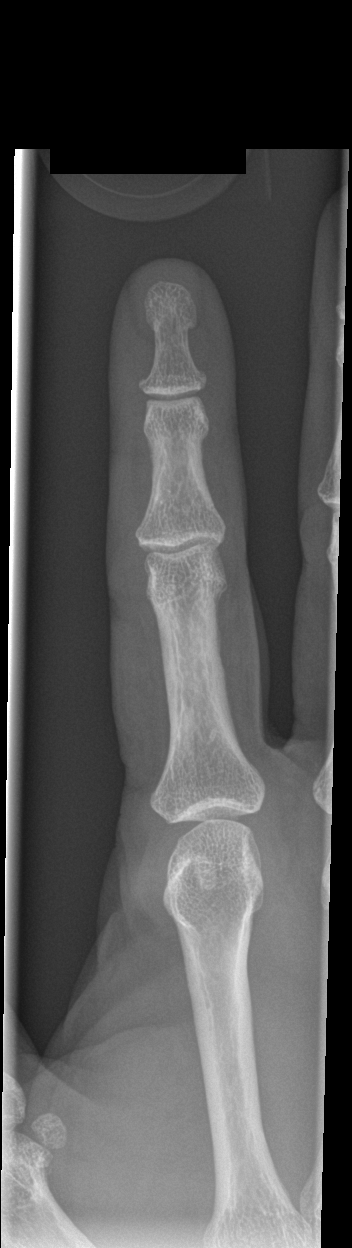

[finger obl]
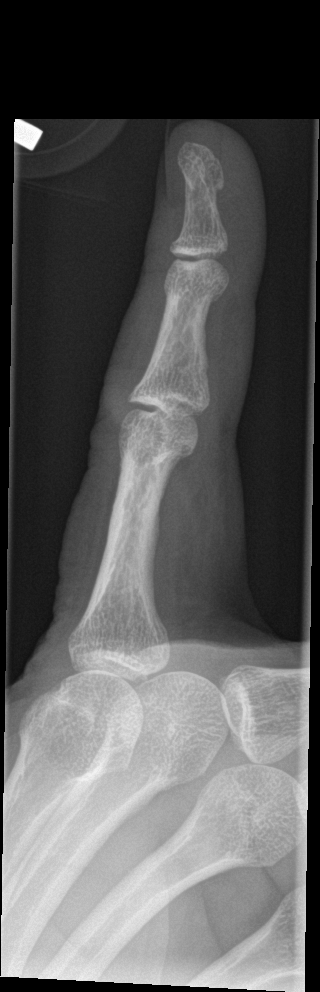

[finger lat]
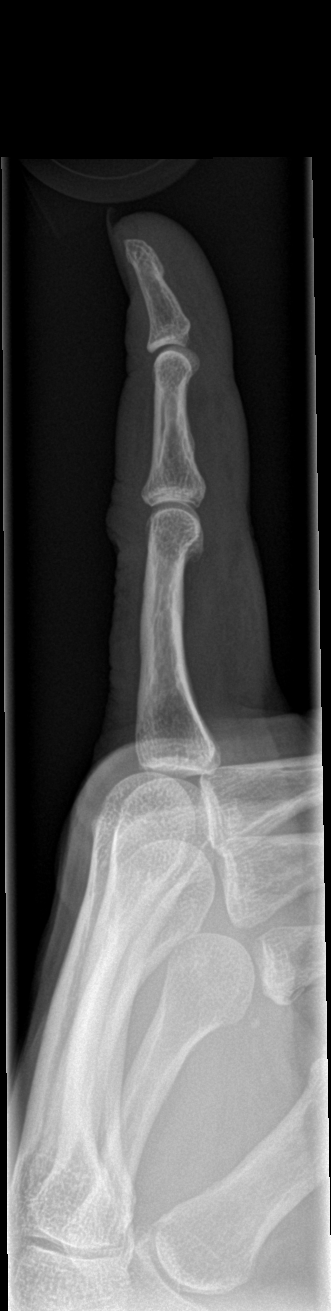

[3 of 3 positions shown; findings below may reference images not displayed]

FINDINGS: There is no evidence of fracture or dislocation. There is no
evidence of arthropathy or other focal bone abnormality. Soft
tissues are unremarkable.
IMPRESSION: Negative.

## 2018-01-17 IMAGING — CR DG LUMBAR SPINE 2-3V
3 series · 3 of 3 positions shown · non-contrast
Comparison: 11/20/2015

CLINICAL DATA: Fall last night, right side pain

EXAM:
LUMBAR SPINE - 2-3 VIEW

[l-spine ap]
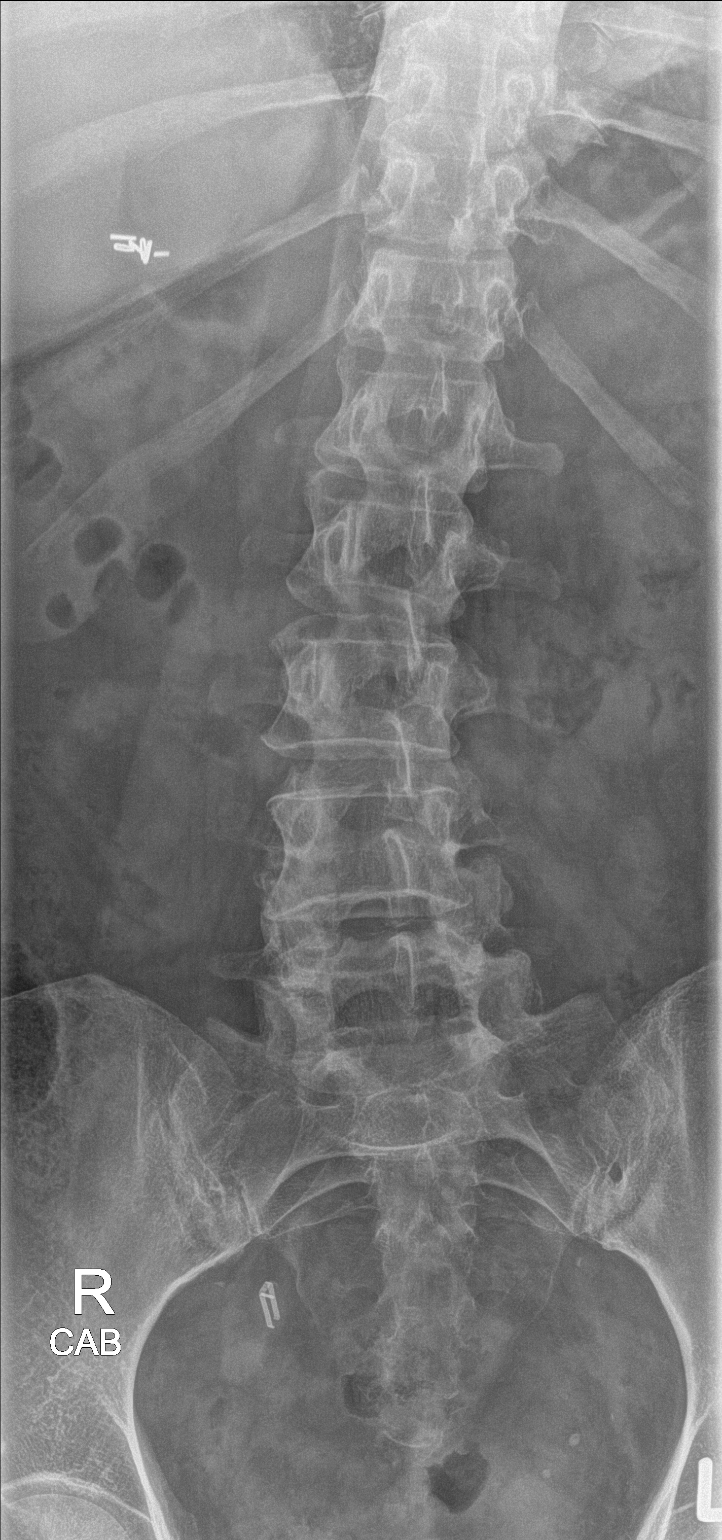

[l-spine lat]
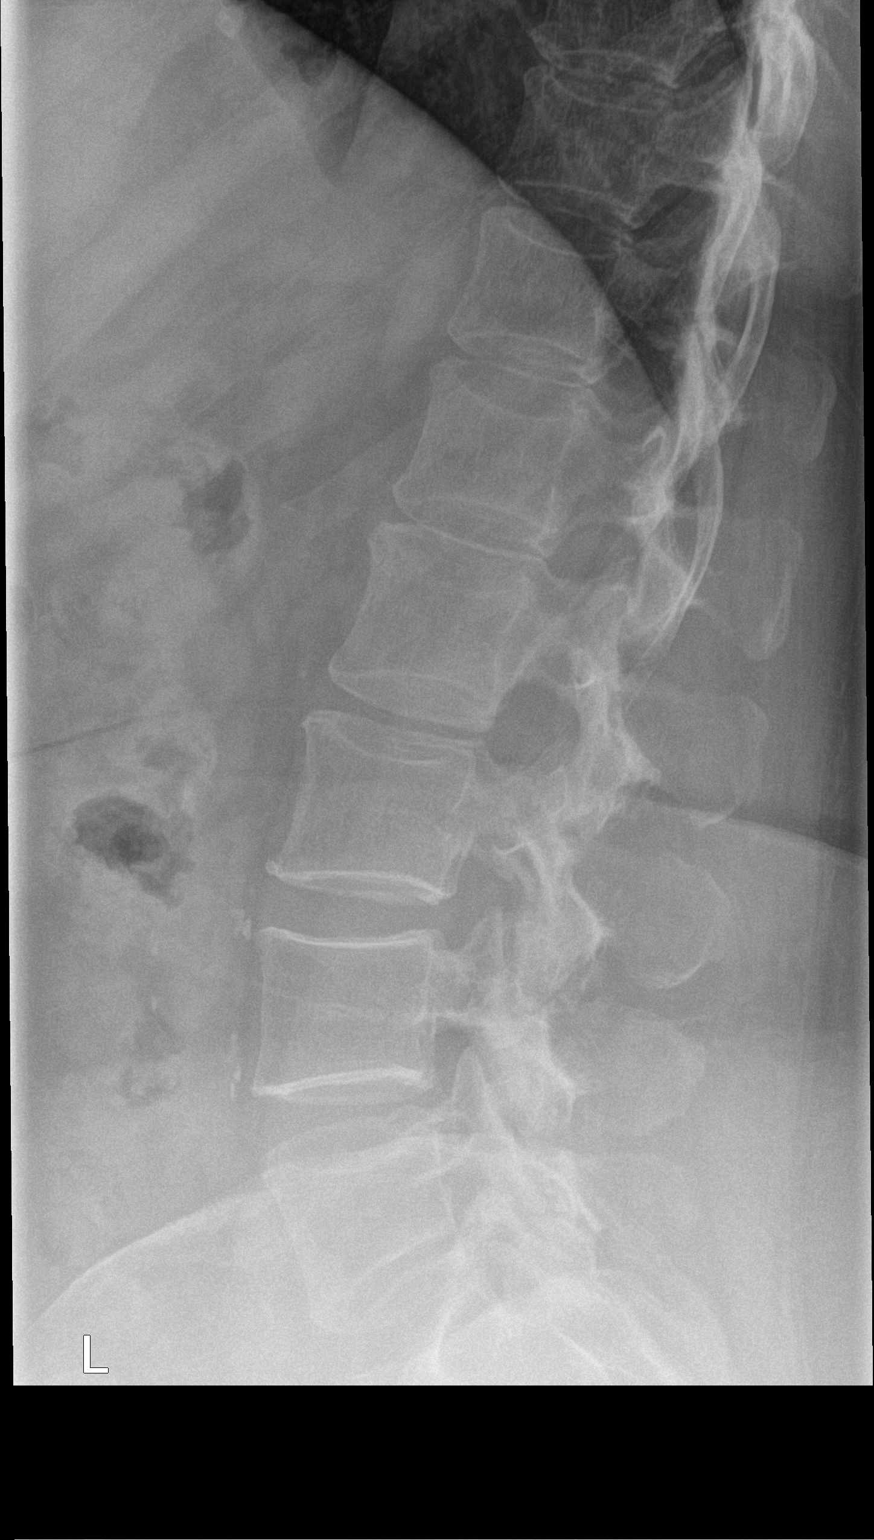

[l-spine spot]
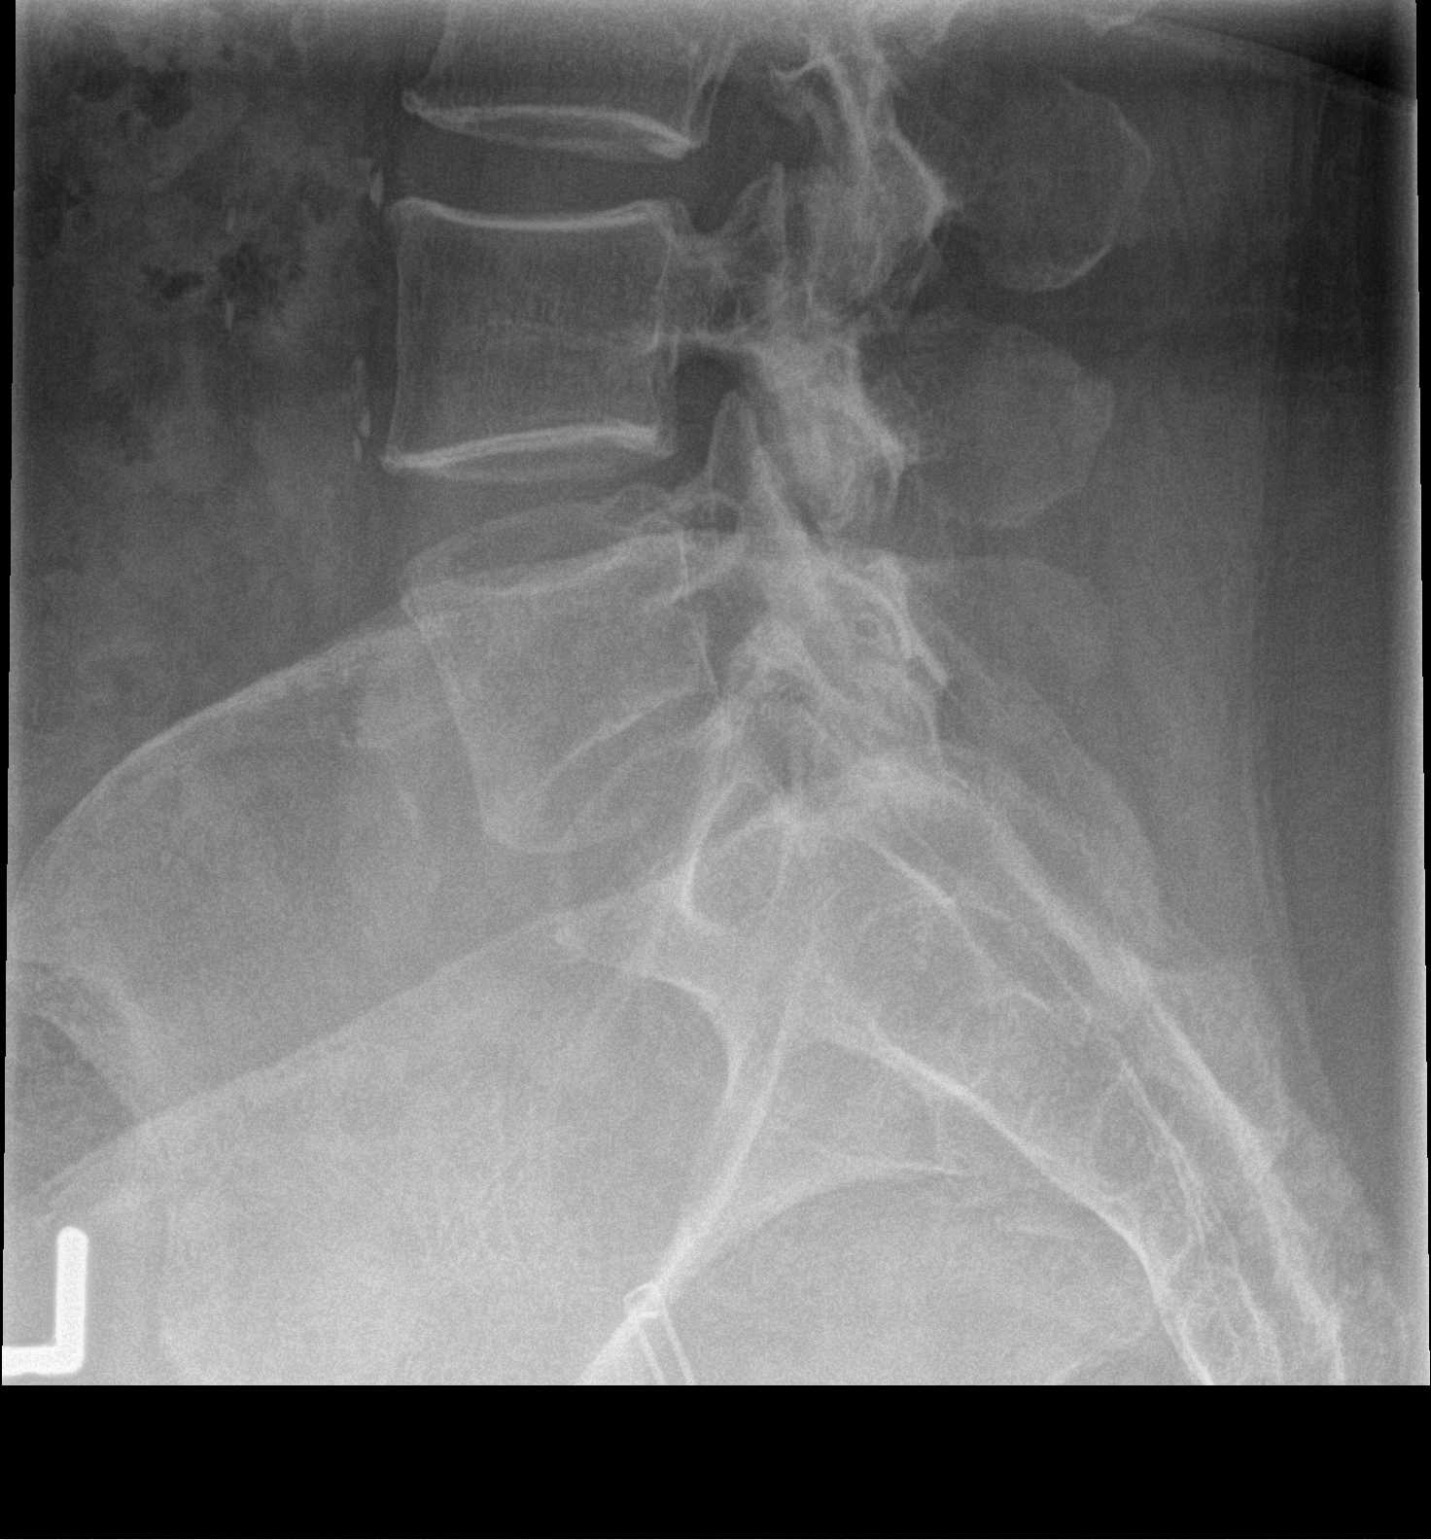

[3 of 3 positions shown; findings below may reference images not displayed]

FINDINGS: Three views of the lumbar spine submitted. No acute fracture or
subluxation. Mild disc space flattening at L5-S1 level. Alignment
and vertebral body heights are preserved. Mild dextroscoliosis.
IMPRESSION: No acute fracture or subluxation. Mild disc space flattening at
L5-S1 level.

## 2018-01-17 IMAGING — CR DG KNEE COMPLETE 4+V*R*
4 series · 4 of 4 positions shown · non-contrast
Comparison: None.

CLINICAL DATA: Right knee pain after multiple falls.

EXAM:
RIGHT KNEE - COMPLETE 4+ VIEW

[knee ap]
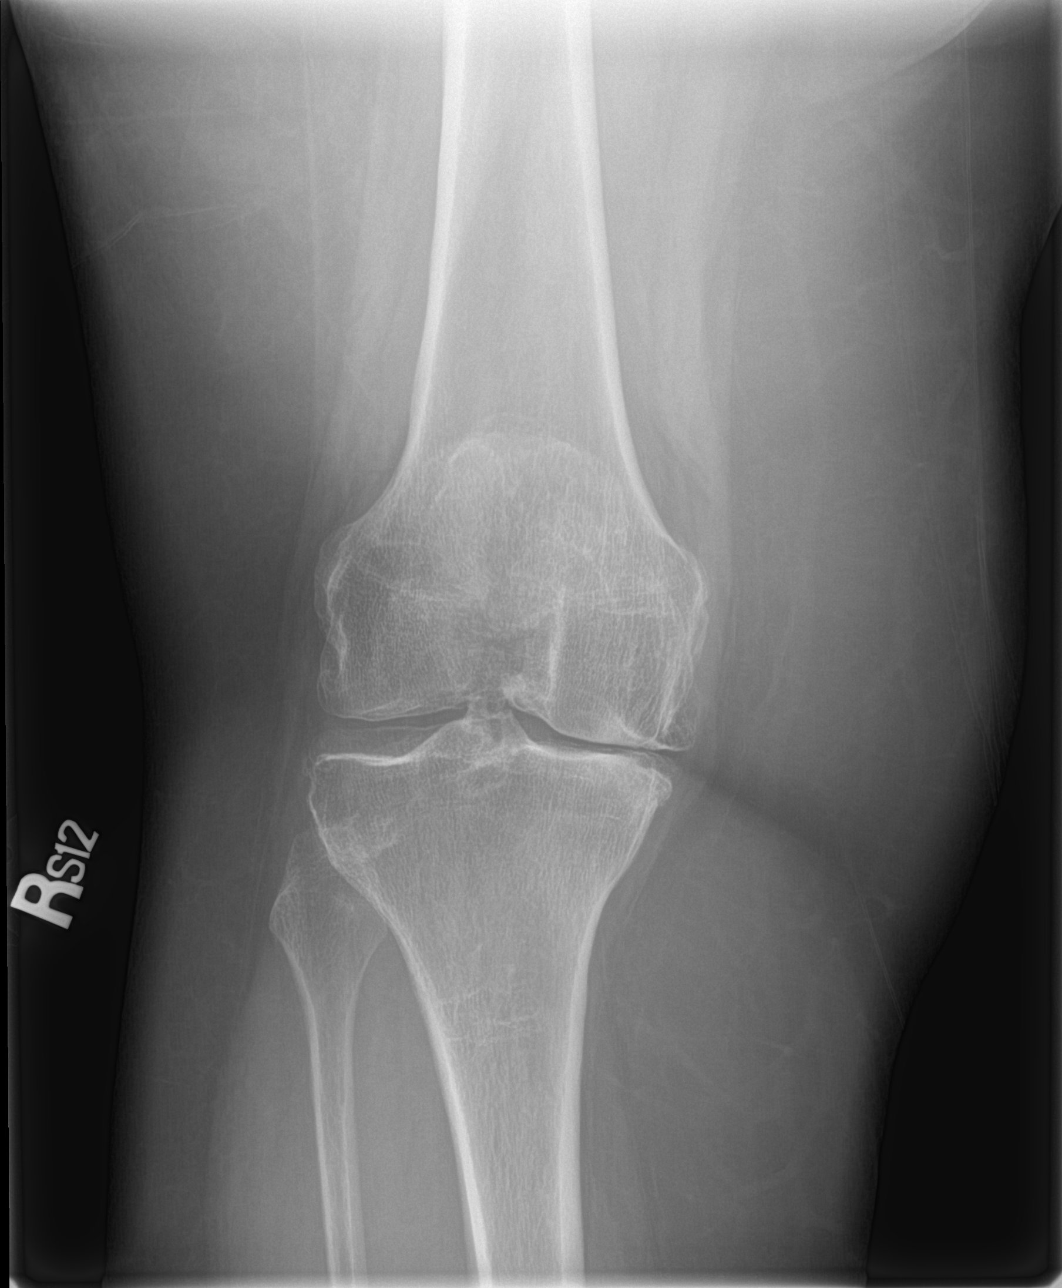

[knee obl (1 of 2)]
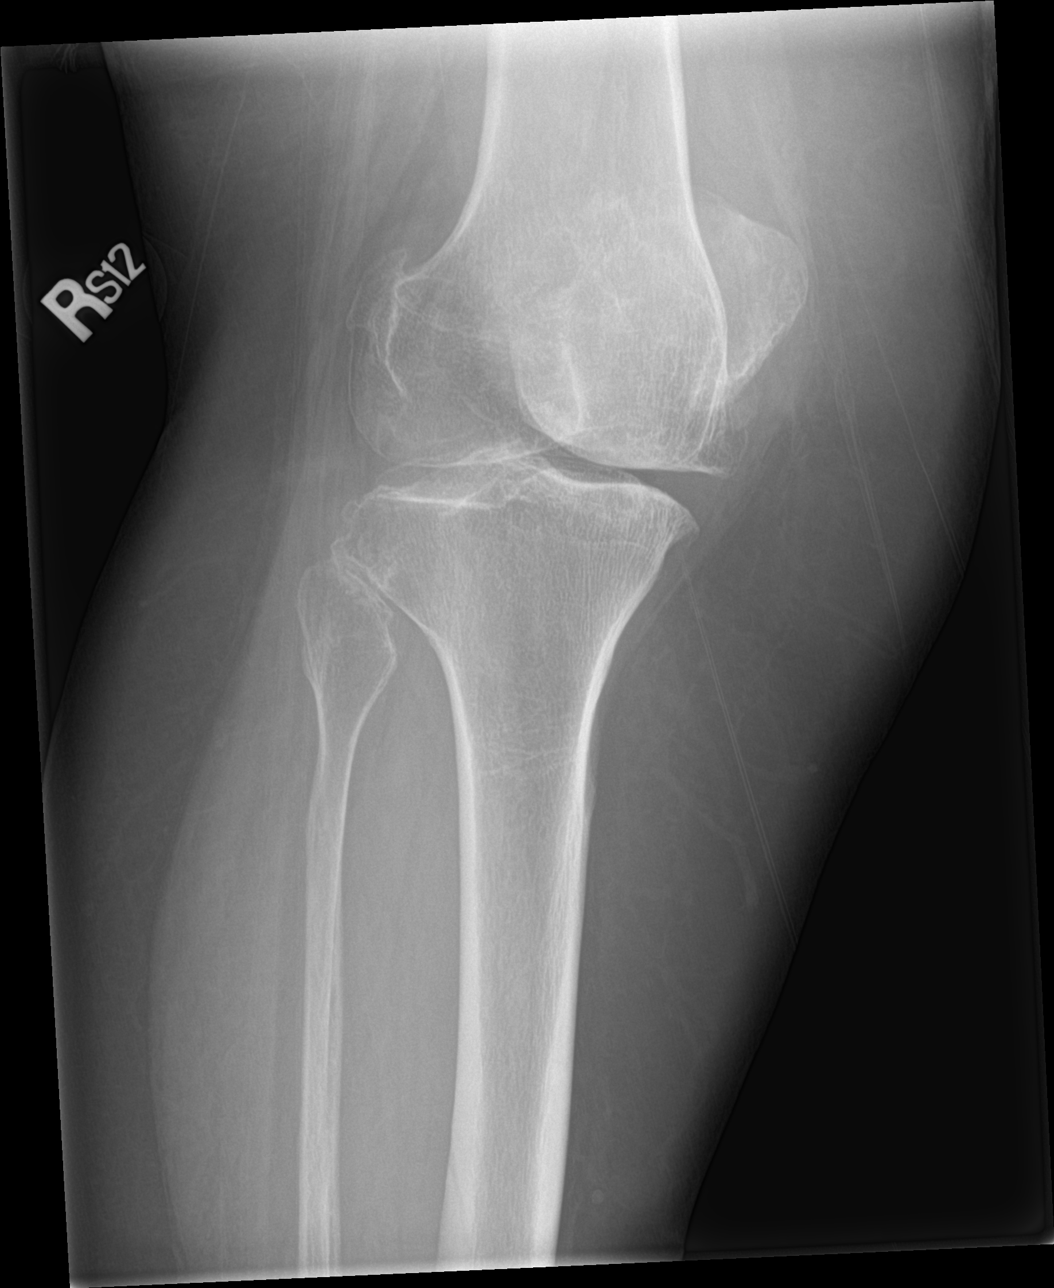

[knee obl (2 of 2)]
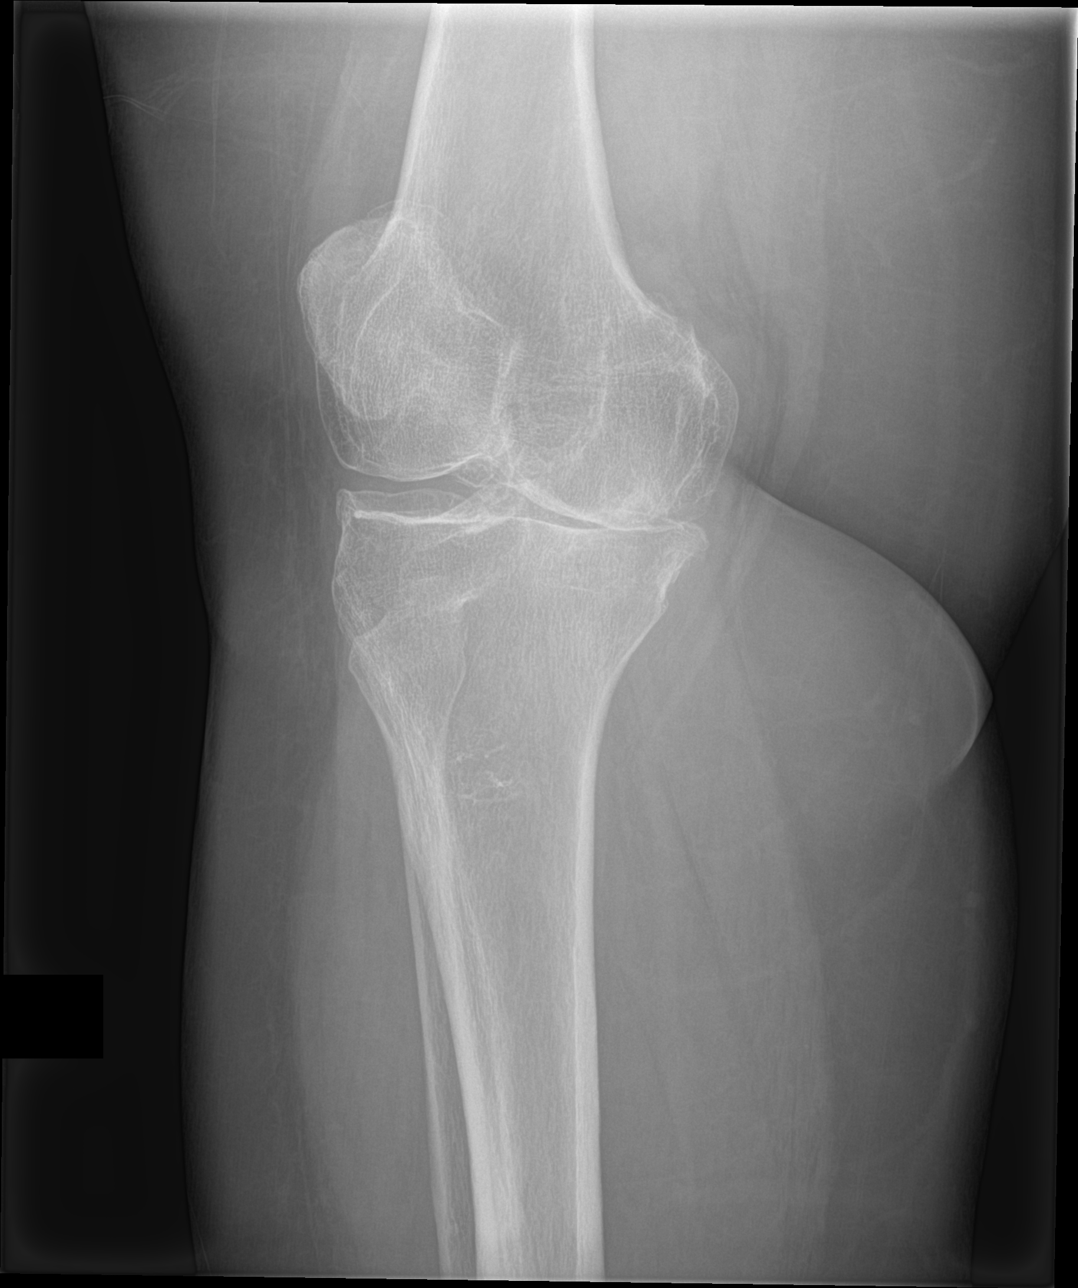

[knee lat]
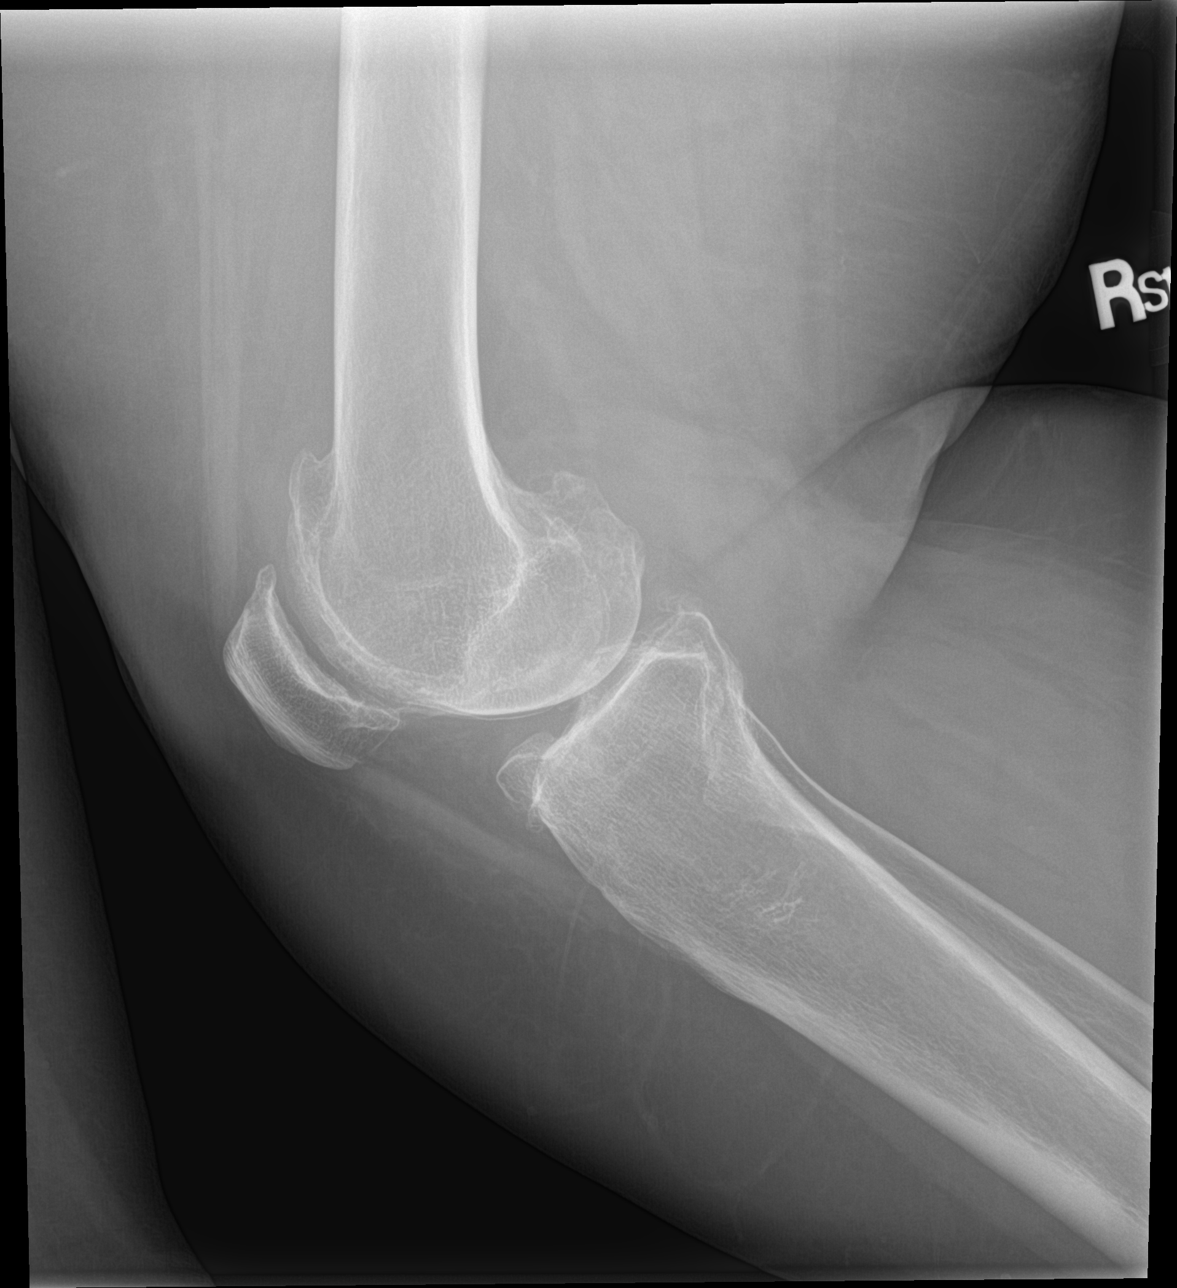

[4 of 4 positions shown; findings below may reference images not displayed]

FINDINGS: No fracture, dislocation, joint effusion or suspicious focal osseous
lesion. Moderate tricompartmental osteoarthritis, most prominent in
the patellofemoral and medial compartments. No radiopaque foreign
body.
IMPRESSION: No fracture, joint effusion or dislocation. Moderate
tricompartmental osteoarthritis in the right knee.

## 2018-01-17 IMAGING — CR DG PELVIS 1-2V
1 series · 1 of 1 positions shown · non-contrast
Comparison: 04/09/2015 CT abdomen/pelvis

CLINICAL DATA: Multiple mechanical falls.  Low back and hip pain.

EXAM:
PELVIS - 1-2 VIEW

[pelvis ap]
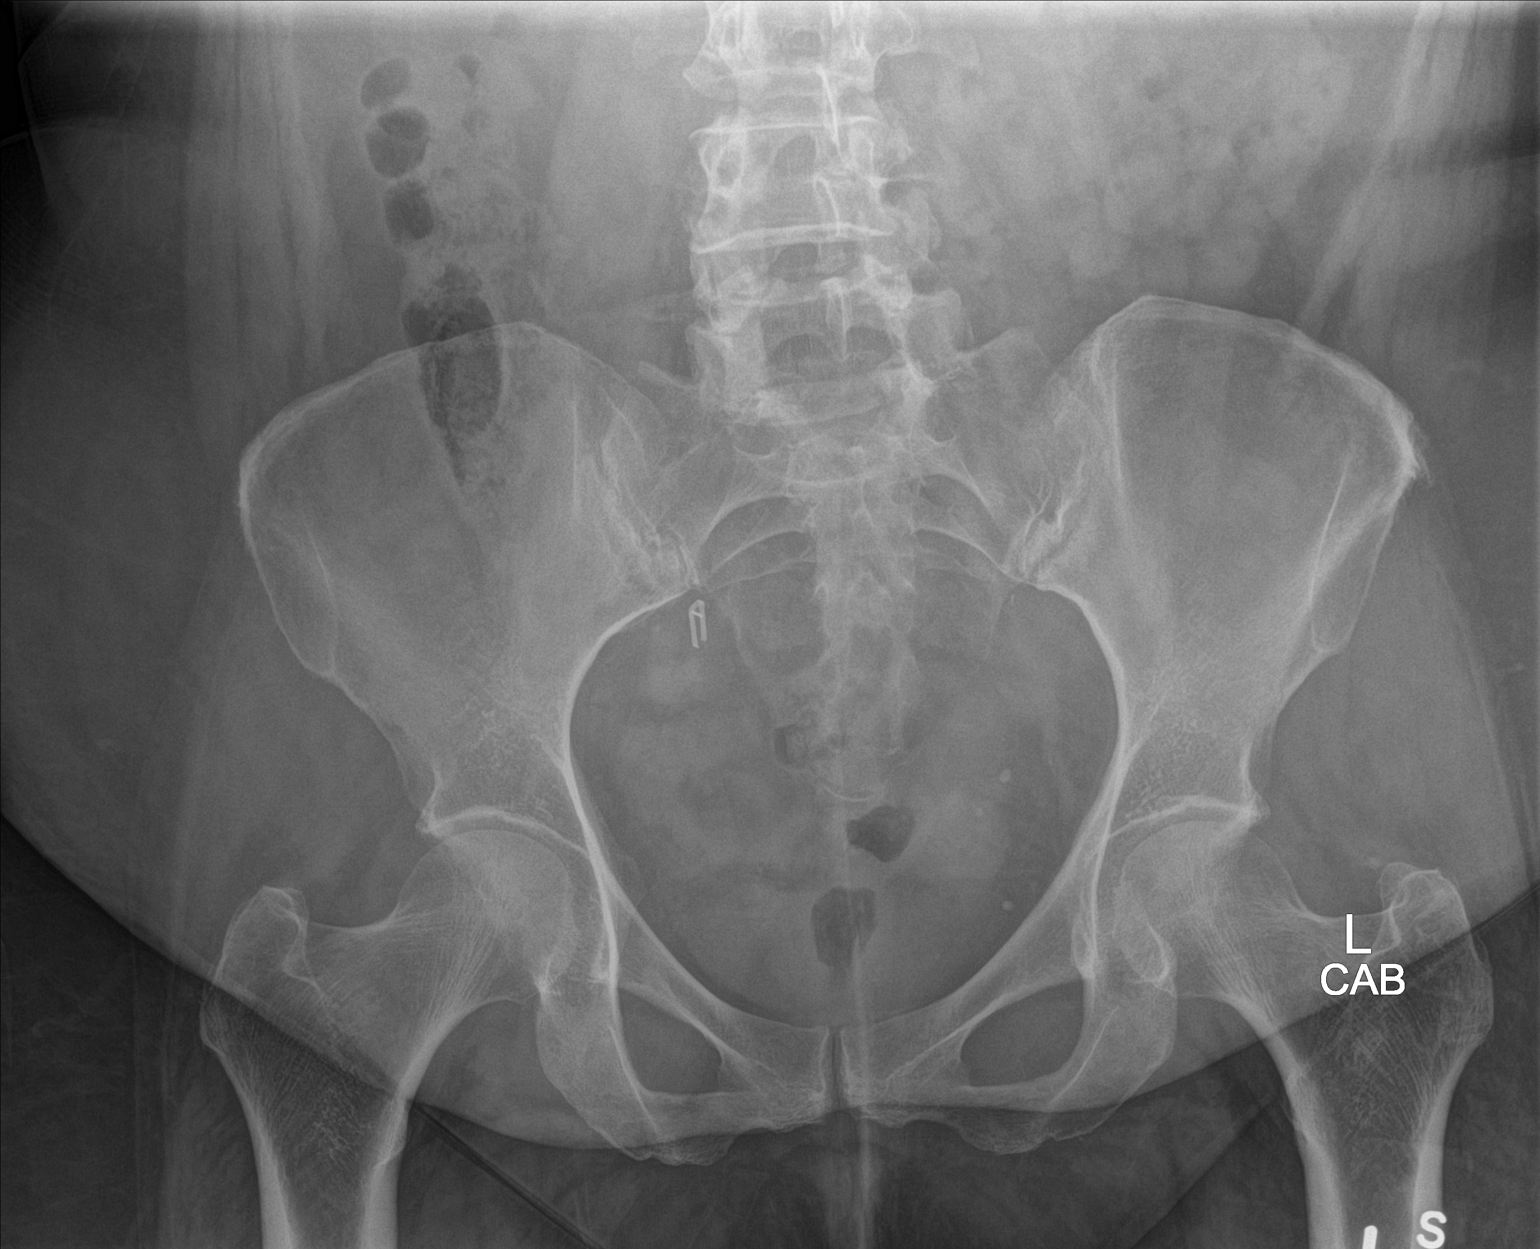

[1 of 1 positions shown; findings below may reference images not displayed]

FINDINGS: No pelvic fracture or diastasis. No suspicious focal osseous lesion.
No evidence of hip malalignment on this single frontal view.
Surgical clip overlies the upper right deep pelvis. Spondylotic
changes are seen in the visualized lower lumbar spine.
IMPRESSION: No pelvic fracture.

## 2018-01-20 IMAGING — CR DG RIBS W/ CHEST 3+V*R*
1 series · 5 of 5 positions shown · non-contrast
Comparison: Chest x-ray dated 12/25/2013.

CLINICAL DATA: Pt to ED c/o right anterior lower rib pain post fall
[REDACTED], reports pain worse when taking a deep breath and moving
from sitting to standing. BB marks pain. Pt denies previous injury.

EXAM:
RIGHT RIBS AND CHEST - 3+ VIEW

[Series 1: dg ribs unilateral w/chest right · 0.14mm/px · 5 of 5 slices shown]
[im 1/5]
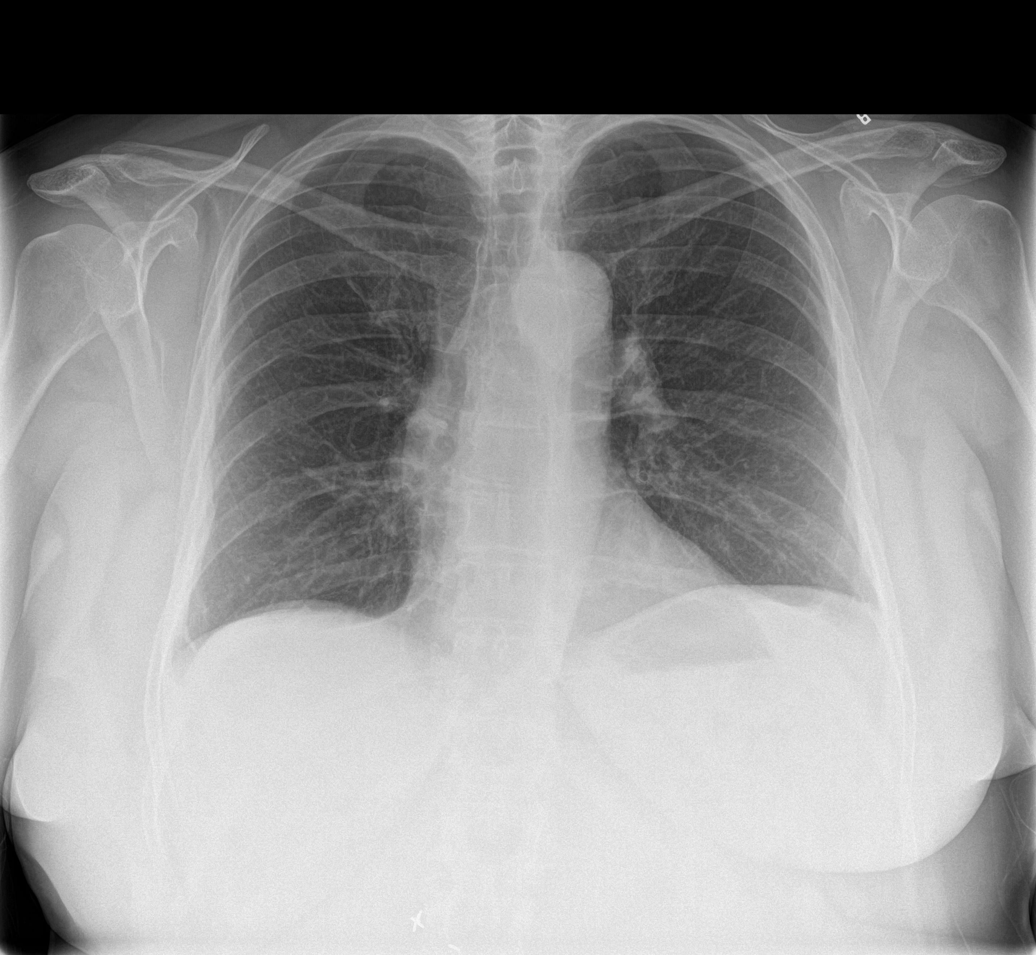
[im 2/5]
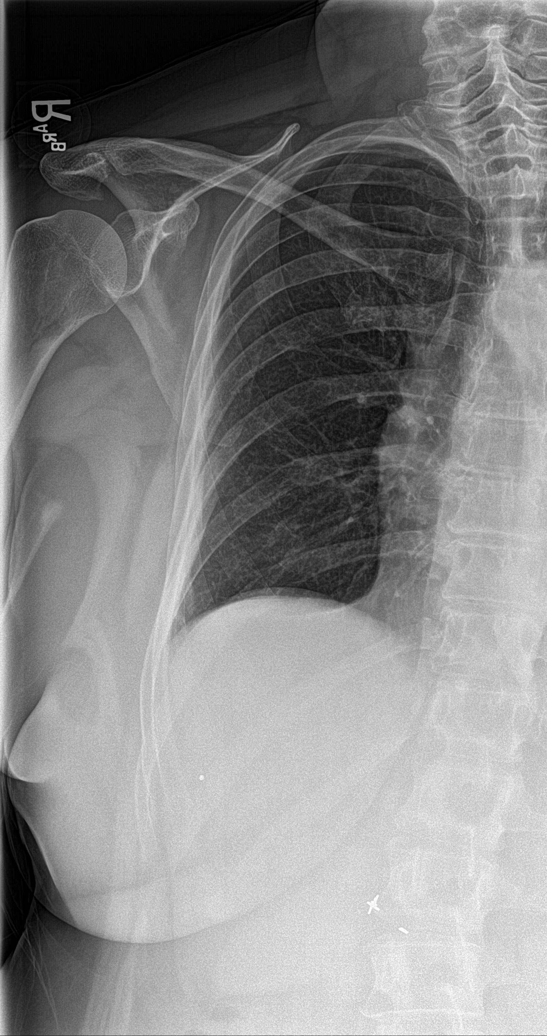
[im 3/5]
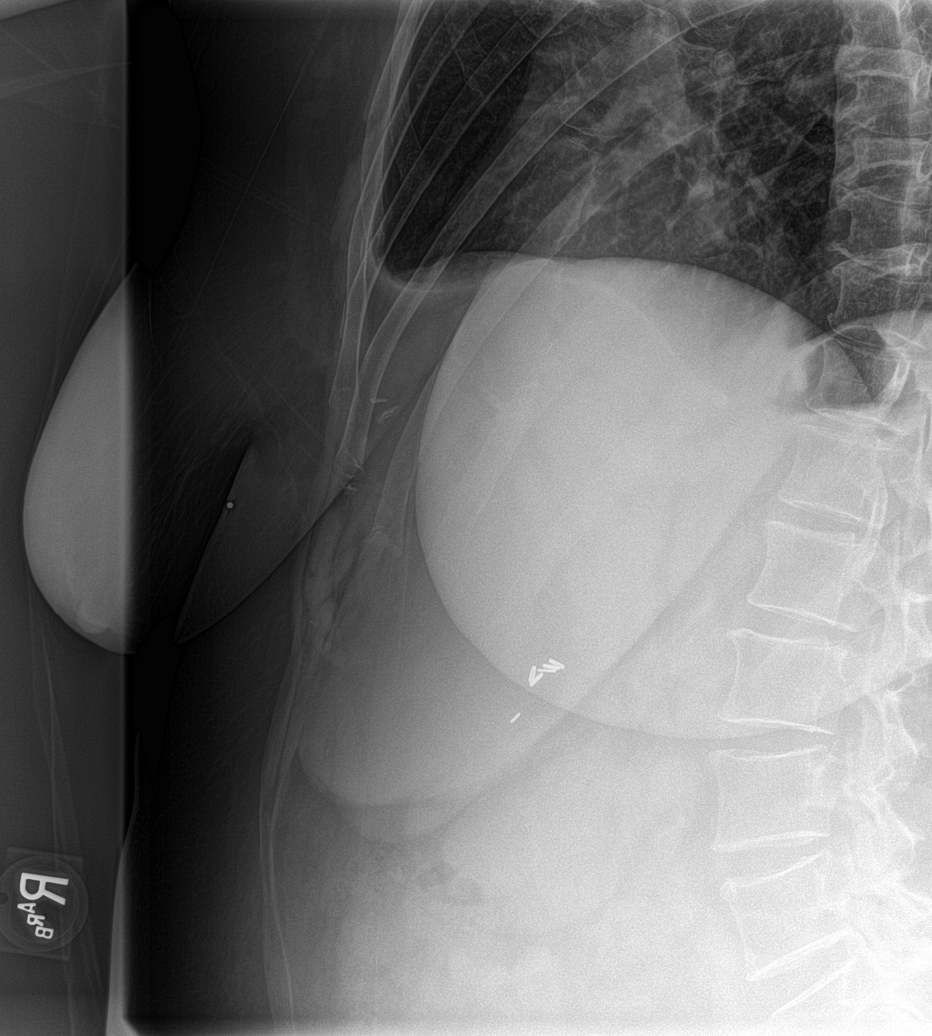
[im 4/5]
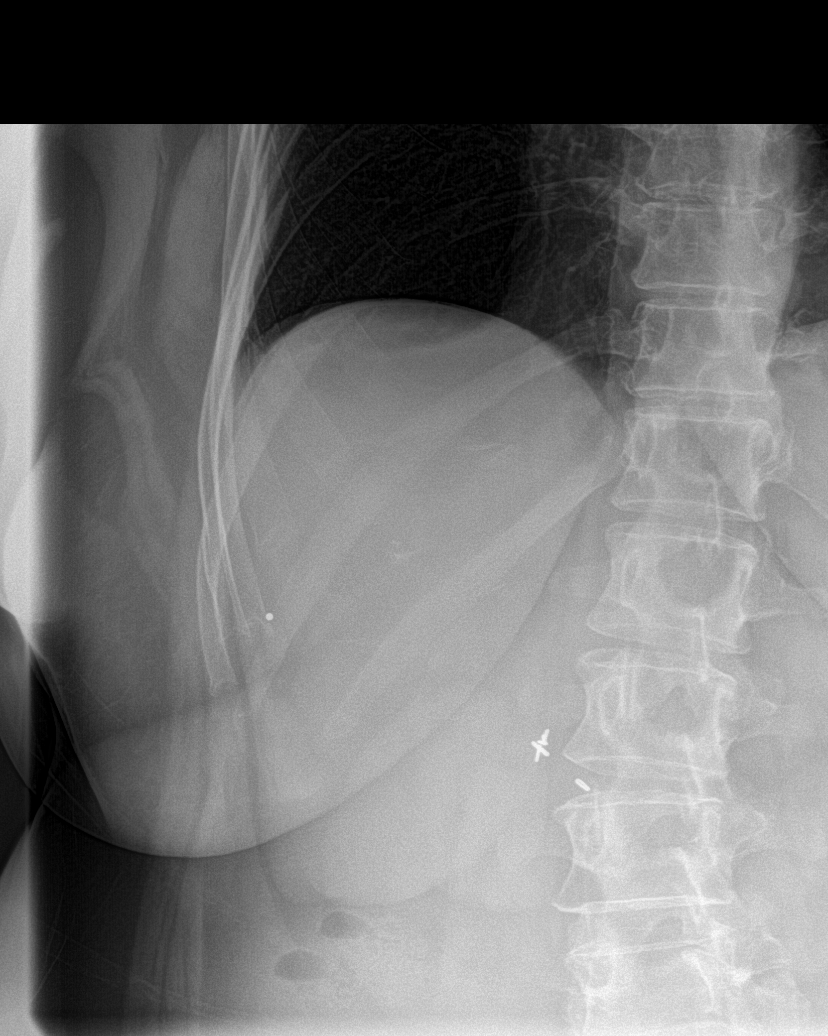
[im 5/5]
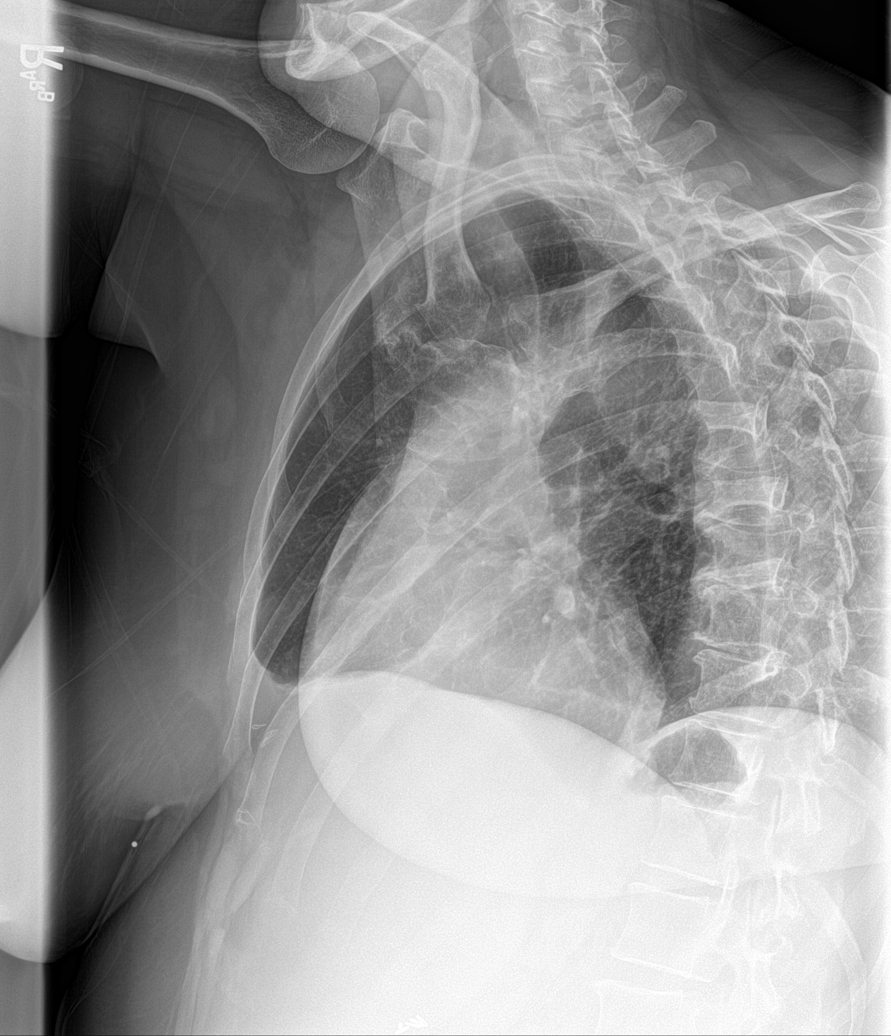

[5 of 5 positions shown; findings below may reference images not displayed]

FINDINGS: Single view of the chest and four views of the right ribs are
provided.

Heart size and mediastinal contours are normal. Lungs are clear. No
pleural effusion or pneumothorax seen. Osseous and soft tissue
structures about the chest are unremarkable. No right-sided rib
fracture or displacement seen.
IMPRESSION: Negative.

## 2018-01-24 ENCOUNTER — Inpatient Hospital Stay
Admission: EM | Admit: 2018-01-24 | Discharge: 2018-01-31 | DRG: 270 | Disposition: A | Discharge status: Home / Self Care | Payer: Medicare Other | Attending: Internal Medicine | Admitting: Internal Medicine

## 2018-01-24 ENCOUNTER — Other Ambulatory Visit: Payer: Self-pay

## 2018-01-24 ENCOUNTER — Encounter: Payer: Self-pay | Admitting: Emergency Medicine

## 2018-01-24 ENCOUNTER — Emergency Department: Payer: Medicare Other

## 2018-01-24 DIAGNOSIS — K559 Vascular disorder of intestine, unspecified: Secondary | ICD-10-CM | POA: Diagnosis present

## 2018-01-24 DIAGNOSIS — Z90710 Acquired absence of both cervix and uterus: Secondary | ICD-10-CM | POA: Diagnosis not present

## 2018-01-24 DIAGNOSIS — Z7901 Long term (current) use of anticoagulants: Secondary | ICD-10-CM

## 2018-01-24 DIAGNOSIS — Z79899 Other long term (current) drug therapy: Secondary | ICD-10-CM

## 2018-01-24 DIAGNOSIS — T82818A Embolism of vascular prosthetic devices, implants and grafts, initial encounter: Principal | ICD-10-CM | POA: Diagnosis present

## 2018-01-24 DIAGNOSIS — M170 Bilateral primary osteoarthritis of knee: Secondary | ICD-10-CM | POA: Diagnosis present

## 2018-01-24 DIAGNOSIS — I509 Heart failure, unspecified: Secondary | ICD-10-CM | POA: Diagnosis present

## 2018-01-24 DIAGNOSIS — F4322 Adjustment disorder with anxiety: Secondary | ICD-10-CM | POA: Diagnosis present

## 2018-01-24 DIAGNOSIS — I700 Atherosclerosis of aorta: Secondary | ICD-10-CM | POA: Diagnosis present

## 2018-01-24 DIAGNOSIS — I110 Hypertensive heart disease with heart failure: Secondary | ICD-10-CM | POA: Diagnosis present

## 2018-01-24 DIAGNOSIS — K55069 Acute infarction of intestine, part and extent unspecified: Secondary | ICD-10-CM | POA: Diagnosis present

## 2018-01-24 DIAGNOSIS — K219 Gastro-esophageal reflux disease without esophagitis: Secondary | ICD-10-CM | POA: Diagnosis present

## 2018-01-24 DIAGNOSIS — E785 Hyperlipidemia, unspecified: Secondary | ICD-10-CM | POA: Diagnosis present

## 2018-01-24 DIAGNOSIS — F17210 Nicotine dependence, cigarettes, uncomplicated: Secondary | ICD-10-CM | POA: Diagnosis present

## 2018-01-24 DIAGNOSIS — E782 Mixed hyperlipidemia: Secondary | ICD-10-CM | POA: Diagnosis present

## 2018-01-24 DIAGNOSIS — G629 Polyneuropathy, unspecified: Secondary | ICD-10-CM | POA: Diagnosis present

## 2018-01-24 DIAGNOSIS — I10 Essential (primary) hypertension: Secondary | ICD-10-CM | POA: Diagnosis present

## 2018-01-24 DIAGNOSIS — Z7982 Long term (current) use of aspirin: Secondary | ICD-10-CM

## 2018-01-24 DIAGNOSIS — R109 Unspecified abdominal pain: Secondary | ICD-10-CM

## 2018-01-24 DIAGNOSIS — R7303 Prediabetes: Secondary | ICD-10-CM | POA: Diagnosis present

## 2018-01-24 DIAGNOSIS — R140 Abdominal distension (gaseous): Secondary | ICD-10-CM

## 2018-01-24 LAB — URINALYSIS, COMPLETE (UACMP) WITH MICROSCOPIC
Bacteria, UA: NONE SEEN
Bilirubin Urine: NEGATIVE
Glucose, UA: NEGATIVE mg/dL
Ketones, ur: NEGATIVE mg/dL
Leukocytes, UA: NEGATIVE
Nitrite: NEGATIVE
Protein, ur: NEGATIVE mg/dL
Specific Gravity, Urine: 1.001 — ABNORMAL LOW (ref 1.005–1.030)
WBC, UA: NONE SEEN WBC/hpf (ref 0–5)
pH: 6 (ref 5.0–8.0)

## 2018-01-24 LAB — COMPREHENSIVE METABOLIC PANEL
ALT: 12 U/L — ABNORMAL LOW (ref 14–54)
AST: 17 U/L (ref 15–41)
Albumin: 4 g/dL (ref 3.5–5.0)
Alkaline Phosphatase: 91 U/L (ref 38–126)
Anion gap: 9 (ref 5–15)
BUN: 9 mg/dL (ref 6–20)
CO2: 22 mmol/L (ref 22–32)
Calcium: 8.9 mg/dL (ref 8.9–10.3)
Chloride: 107 mmol/L (ref 101–111)
Creatinine, Ser: 0.98 mg/dL (ref 0.44–1.00)
GFR calc Af Amer: >60 mL/min (ref >60)
GFR calc non Af Amer: >60 mL/min (ref >60)
Glucose, Bld: 109 mg/dL — ABNORMAL HIGH (ref 65–99)
Potassium: 3.4 mmol/L — ABNORMAL LOW (ref 3.5–5.1)
Sodium: 138 mmol/L (ref 135–145)
Total Bilirubin: 0.4 mg/dL (ref 0.3–1.2)
Total Protein: 7.5 g/dL (ref 6.5–8.1)

## 2018-01-24 LAB — CBC
HCT: 39.4 % (ref 35.0–47.0)
Hemoglobin: 12.5 g/dL (ref 12.0–16.0)
MCH: 26.1 pg (ref 26.0–34.0)
MCHC: 31.6 g/dL — ABNORMAL LOW (ref 32.0–36.0)
MCV: 82.4 fL (ref 80.0–100.0)
Platelets: 309 10*3/uL (ref 150–440)
RBC: 4.78 MIL/uL (ref 3.80–5.20)
RDW: 17.8 % — ABNORMAL HIGH (ref 11.5–14.5)
WBC: 8.2 10*3/uL (ref 3.6–11.0)

## 2018-01-24 LAB — PROTIME-INR
INR: 0.95
Prothrombin Time: 12.6 seconds (ref 11.4–15.2)

## 2018-01-24 LAB — APTT: aPTT: 28 seconds (ref 24–36)

## 2018-01-24 LAB — LACTIC ACID, PLASMA: Lactic Acid, Venous: 1.4 mmol/L (ref 0.5–1.9)

## 2018-01-24 LAB — HEPARIN LEVEL (UNFRACTIONATED): Heparin Unfractionated: <0.1 IU/mL — ABNORMAL LOW (ref 0.30–0.70)

## 2018-01-24 LAB — TYPE AND SCREEN
ABO/RH(D): A POS
Antibody Screen: NEGATIVE

## 2018-01-24 MED ORDER — GABAPENTIN 300 MG PO CAPS
300.0000 mg | ORAL_CAPSULE | Freq: Two times a day (BID) | ORAL | Status: DC
  Administered 2018-01-24 – 2018-01-31 (×14): 300 mg via ORAL
  Filled 2018-01-24 (×15): qty 1

## 2018-01-24 MED ORDER — ONDANSETRON HCL 4 MG PO TABS
4.0000 mg | ORAL_TABLET | Freq: Four times a day (QID) | ORAL | Status: DC | PRN
  Administered 2018-01-29 – 2018-01-31 (×3): 4 mg via ORAL
  Filled 2018-01-24 (×3): qty 1

## 2018-01-24 MED ORDER — ONDANSETRON HCL 4 MG/2ML IJ SOLN
4.0000 mg | Freq: Once | INTRAMUSCULAR | Status: AC
  Administered 2018-01-24: 4 mg via INTRAVENOUS
  Filled 2018-01-24: qty 2

## 2018-01-24 MED ORDER — METOPROLOL TARTRATE 50 MG PO TABS
50.0000 mg | ORAL_TABLET | Freq: Two times a day (BID) | ORAL | Status: DC
  Administered 2018-01-24 – 2018-01-30 (×9): 50 mg via ORAL
  Filled 2018-01-24 (×13): qty 1

## 2018-01-24 MED ORDER — ATORVASTATIN CALCIUM 20 MG PO TABS
80.0000 mg | ORAL_TABLET | Freq: Every day | ORAL | Status: DC
  Administered 2018-01-24 – 2018-01-30 (×7): 80 mg via ORAL
  Filled 2018-01-24 (×7): qty 4

## 2018-01-24 MED ORDER — IOPAMIDOL (ISOVUE-370) INJECTION 76%
100.0000 mL | Freq: Once | INTRAVENOUS | Status: AC | PRN
  Administered 2018-01-24: 100 mL via INTRAVENOUS

## 2018-01-24 MED ORDER — ACETAMINOPHEN 650 MG RE SUPP: 650.0000 mg | Freq: Four times a day (QID) | RECTAL | Status: DC | PRN

## 2018-01-24 MED ORDER — SODIUM CHLORIDE 0.9 % IV SOLN
INTRAVENOUS | Status: AC
  Administered 2018-01-24: via INTRAVENOUS

## 2018-01-24 MED ORDER — ASPIRIN EC 81 MG PO TBEC
81.0000 mg | DELAYED_RELEASE_TABLET | Freq: Every day | ORAL | Status: DC
  Administered 2018-01-25 – 2018-01-31 (×7): 81 mg via ORAL
  Filled 2018-01-24 (×7): qty 1

## 2018-01-24 MED ORDER — MORPHINE SULFATE (PF) 4 MG/ML IV SOLN
4.0000 mg | INTRAVENOUS | Status: DC | PRN
  Administered 2018-01-25 (×2): 4 mg via INTRAVENOUS
  Filled 2018-01-24 (×2): qty 1

## 2018-01-24 MED ORDER — ACETAMINOPHEN 325 MG PO TABS
650.0000 mg | ORAL_TABLET | Freq: Four times a day (QID) | ORAL | Status: DC | PRN
  Administered 2018-01-30 – 2018-01-31 (×3): 650 mg via ORAL
  Filled 2018-01-24 (×3): qty 2

## 2018-01-24 MED ORDER — SENNOSIDES-DOCUSATE SODIUM 8.6-50 MG PO TABS
2.0000 | ORAL_TABLET | Freq: Every evening | ORAL | Status: DC | PRN
  Filled 2018-01-24: qty 2

## 2018-01-24 MED ORDER — MORPHINE SULFATE (PF) 4 MG/ML IV SOLN
4.0000 mg | Freq: Once | INTRAVENOUS | Status: AC
Start: 2018-01-24 — End: 2018-01-24
  Administered 2018-01-24: 4 mg via INTRAVENOUS
  Filled 2018-01-24: qty 1

## 2018-01-24 MED ORDER — NICOTINE 14 MG/24HR TD PT24
14.0000 mg | MEDICATED_PATCH | Freq: Every day | TRANSDERMAL | Status: DC
  Administered 2018-01-24 – 2018-01-31 (×8): 14 mg via TRANSDERMAL
  Filled 2018-01-24 (×8): qty 1

## 2018-01-24 MED ORDER — HEPARIN (PORCINE) IN NACL 100-0.45 UNIT/ML-% IJ SOLN
1200.0000 [IU]/h | INTRAMUSCULAR | Status: DC
  Administered 2018-01-24: 1300 [IU]/h via INTRAVENOUS
  Administered 2018-01-25: 1150 [IU]/h via INTRAVENOUS
  Administered 2018-01-27: 1050 [IU]/h via INTRAVENOUS
  Administered 2018-01-28 (×2): 1200 [IU]/h via INTRAVENOUS
  Filled 2018-01-24 (×5): qty 250

## 2018-01-24 MED ORDER — PANTOPRAZOLE SODIUM 40 MG PO TBEC
40.0000 mg | DELAYED_RELEASE_TABLET | Freq: Two times a day (BID) | ORAL | Status: DC
  Administered 2018-01-24 – 2018-01-31 (×14): 40 mg via ORAL
  Filled 2018-01-24 (×14): qty 1

## 2018-01-24 MED ORDER — LISINOPRIL 20 MG PO TABS
40.0000 mg | ORAL_TABLET | Freq: Every day | ORAL | Status: DC
  Administered 2018-01-27 – 2018-01-30 (×4): 40 mg via ORAL
  Filled 2018-01-24 (×5): qty 2

## 2018-01-24 MED ORDER — MORPHINE SULFATE (PF) 4 MG/ML IV SOLN
4.0000 mg | Freq: Once | INTRAVENOUS | Status: AC
  Administered 2018-01-24: 4 mg via INTRAVENOUS
  Filled 2018-01-24: qty 1

## 2018-01-24 MED ORDER — HEPARIN BOLUS VIA INFUSION
4500.0000 [IU] | Freq: Once | INTRAVENOUS | Status: AC
  Administered 2018-01-24: 4500 [IU] via INTRAVENOUS
  Filled 2018-01-24: qty 4500

## 2018-01-24 MED ORDER — OXYCODONE HCL 5 MG PO TABS
5.0000 mg | ORAL_TABLET | ORAL | Status: DC | PRN
  Administered 2018-01-24 – 2018-01-30 (×18): 5 mg via ORAL
  Filled 2018-01-24 (×18): qty 1

## 2018-01-24 MED ORDER — ONDANSETRON HCL 4 MG/2ML IJ SOLN
4.0000 mg | Freq: Four times a day (QID) | INTRAMUSCULAR | Status: DC | PRN
  Administered 2018-01-27 – 2018-01-31 (×7): 4 mg via INTRAVENOUS
  Filled 2018-01-24 (×5): qty 2

## 2018-01-24 MED ORDER — ALPRAZOLAM 0.5 MG PO TABS: 0.5000 mg | ORAL_TABLET | Freq: Two times a day (BID) | ORAL | Status: DC | PRN

## 2018-01-24 NOTE — ED Triage Notes (Signed)
Pt via pov from home with abdominal pain x 1 week. States she had part of her colon removed in October due to aortic aneurysm that cut off blood supply to colon. She states she had some blood in her stool today. Pt states that the right side of her abdomen continues to grow larger and harder than her left. Pt alert & oriented with NAD noted.

## 2018-01-24 NOTE — Progress Notes (Addendum)
ANTICOAGULATION CONSULT NOTE - Initial Consult  Pharmacy Consult for Heparin  Indication: occluded SMA stent  No Known Allergies  Patient Measurements: Height: 5\' 3"  (160 cm) Weight: 215 lb (97.5 kg) IBW/kg (Calculated) : 52.4 Heparin Dosing Weight: 75.1 kg   Vital Signs: Temp: 98.6 F (37 C) (04/04 1754) Temp Source: Oral (04/04 1754) BP: 168/104 (04/04 2017) Pulse Rate: 104 (04/04 2017)  Labs: Recent Labs    01/24/18 1808 01/24/18 2109  HGB 12.5  --   HCT 39.4  --   PLT 309  --   APTT  --  28  LABPROT  --  12.6  INR  --  0.95  CREATININE 0.98  --     Estimated Creatinine Clearance: 69.5 mL/min (by C-G formula based on SCr of 0.98 mg/dL).   Medical History: Past Medical History:  Diagnosis Date  . Abdominal aortic atherosclerosis ([place])    a. 05/2017 CTA abd/pelvis: significant atherosclerotic dzs of the infrarenal abd Ao w/ some mural thrombus. No aneurysm or dissection.  . Abdominal pain   . Acute focal ischemia of small intestine (HCC)   . Acute right-sided low back pain with right-sided sciatica 06/13/2017  . AKI (acute kidney injury) ([place]) 07/29/2017  . Baker's cyst of knee, right    a. 07/2016 U/S: 4.1 x 1.4 x 2.9 cystic structure in R poplitetal fossa.  . Bell's palsy   . Cancer ([place])   . Chest pain    a. 08/2012 Lexiscan MV: EF 54%, non ischemia/infarct.  . Chronic idiopathic constipation   . Chronic mesenteric ischemia ([place])   . Diastolic dysfunction    a. 05/2017 Echo: Ef 60-65%, no rwma, Gr1 DD, no source of cardiac emboli.  . 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 05/2017 CTA Abd/pelvis: apparent thrombus or embolus in prox SMA (70-90%); b. 05/2017 catheter directed tPA, mechanical thrombectomy, and stenting of the SMA.  . Essential hypertension with goal blood pressure less than 140/90 01/19/2016  . Gastroesophageal reflux disease 01/19/2016  . GERD (gastroesophageal reflux disease)   . Hyperlipidemia   . Hypertension   . Hypotension  07/29/2017  . Morbid obesity with BMI of 40.0-44.9, adult ([place])   . Occlusive mesenteric ischemia ([place]) 06/19/2017  . Personal history of other malignant neoplasm of skin 06/01/2016  . Primary osteoarthritis of both knees 06/13/2017  . Recurrent major depressive disorder ([place]) 01/19/2016  . SBO (small bowel obstruction) ([place]) 08/06/2017  . 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 Urinary tract infection 01/09/2014    Medications:   (Not in a hospital admission)  Assessment: Pharmacy consulted to dose heparin in this 57 year old female admitted with occluded SMA stent.  Pt was on eliquis at home, pt states last dose was 4/3 AM.  CrCl = 69.5 ml/min  Goal of Therapy:  Heparin level 0.3-0.7 units/ml Monitor platelets by anticoagulation protocol: Yes   Plan:  Give 4500 units bolus x 1 Start heparin infusion at 1300 units/hr Check anti-Xa level in 6 hours and daily while on heparin Continue to monitor H&H and platelets  Will monitor aPTT if baseline HL is elevated.   Lian Pounds D 01/24/2018,9:48 PM

## 2018-01-24 NOTE — ED Provider Notes (Signed)
[facility] Emergency Department Provider Note  ____________________________________________  Time seen: Approximately 8:28 PM  I have reviewed the triage vital signs and the nursing notes.   HISTORY  Chief Complaint Abdominal Pain   HPI Carla [name] is a 57 y.o. female mesenteric ischemia from acute emboli status post mechanical thrombectomy, directed TPA and stenting of the SMA in 2018, on Eliquis, SBO with bowel resection, CHF, HTN, HLD who presents for evaluation of abdominal pain.  Patient reports intermittent abdominal pain since October 2018 after she had bowel resection from an SBO.  For the last 7 days the pain has become constant, sharp, severe, worse when she eats, mostly on the right side of her abdomen.  She denies constipation or diarrhea.  She has had nausea but no vomiting.  She denies dysuria or hematuria, fever or chills.  Patient reports that she has not seen any of her surgeons since being discharged from the hospital 2018 due to lack of insurance.  Past Medical History:  Diagnosis Date  . Abdominal aortic atherosclerosis ([place])    a. 05/2017 CTA abd/pelvis: significant atherosclerotic dzs of the infrarenal abd Ao w/ some mural thrombus. No aneurysm or dissection.  . Abdominal pain   . Acute focal ischemia of small intestine (HCC)   . Acute right-sided low back pain with right-sided sciatica 06/13/2017  . AKI (acute kidney injury) ([place]) 07/29/2017  . Baker's cyst of knee, right    a. 07/2016 U/S: 4.1 x 1.4 x 2.9 cystic structure in R poplitetal fossa.  . Bell's palsy   . Cancer ([place])   . Chest pain    a. 08/2012 Lexiscan MV: EF 54%, non ischemia/infarct.  . Chronic idiopathic constipation   . Chronic mesenteric ischemia ([place])   . Diastolic dysfunction    a. 05/2017 Echo: Ef 60-65%, no rwma, Gr1 DD, no source of cardiac emboli.  . 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 05/2017 CTA Abd/pelvis: apparent thrombus or embolus in prox  SMA (70-90%); b. 05/2017 catheter directed tPA, mechanical thrombectomy, and stenting of the SMA.  . Essential hypertension with goal blood pressure less than 140/90 01/19/2016  . Gastroesophageal reflux disease 01/19/2016  . GERD (gastroesophageal reflux disease)   . Hyperlipidemia   . Hypertension   . Hypotension 07/29/2017  . Morbid obesity with BMI of 40.0-44.9, adult ([place])   . Occlusive mesenteric ischemia ([place]) 06/19/2017  . Personal history of other malignant neoplasm of skin 06/01/2016  . Primary osteoarthritis of both knees 06/13/2017  . Recurrent major depressive disorder ([place]) 01/19/2016  . SBO (small bowel obstruction) ([place]) 08/06/2017  . 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 Urinary tract infection 01/09/2014    Patient Active Problem List   Diagnosis Date Noted  . Adjustment disorder with anxiety 08/15/2017  . Chronic idiopathic constipation   . Protein-calorie malnutrition, severe 08/08/2017  . SBO (small bowel obstruction) ([place]) 08/06/2017  . Chronic mesenteric ischemia ([place])   . AKI (acute kidney injury) ([place]) 07/29/2017  . Hypotension 07/29/2017  . 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 Abdominal pain   . Occlusive mesenteric ischemia ([place]) 06/19/2017  . 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 Acute focal ischemia of small intestine (HCC)   . Acute right-sided low back pain with right-sided sciatica 06/13/2017  . Primary osteoarthritis of both knees 06/13/2017  . Pain syndrome, chronic 09/13/2016  . Morbid obesity with BMI of 40.0-44.9, adult ([place]) 07/16/2016  . Personal  history of other malignant neoplasm of skin 06/01/2016  . Chronic pain of both knees 01/19/2016  . Essential hypertension with goal blood pressure less than 140/90 01/19/2016  . Gastroesophageal reflux disease 01/19/2016  . Hyperlipidemia 01/19/2016  . Prediabetes 01/19/2016  . Recurrent major depressive disorder ([place]) 01/19/2016  . Urinary tract infection 01/09/2014  .  Chronic pelvic pain in female 11/14/2013  . Mixed stress and urge urinary incontinence 11/14/2013  . Chest pain 08/06/2012    Past Surgical History:  Procedure Laterality Date  . CHOLECYSTECTOMY    . LAPAROTOMY N/A 08/08/2017   Procedure: EXPLORATORY LAPAROTOMY POSSIBLE BOWEL RESECTION;  Surgeon: Jules Husbands, MD;  Location: [facility] ORS;  Service: General;  Laterality: N/A;  . TEE WITHOUT CARDIOVERSION N/A 06/22/2017   Procedure: TRANSESOPHAGEAL ECHOCARDIOGRAM (TEE);  Surgeon: Wellington Hampshire, MD;  Location: [facility] ORS;  Service: Cardiovascular;  Laterality: N/A;  . VAGINAL HYSTERECTOMY    . VISCERAL ARTERY INTERVENTION N/A 06/20/2017   Procedure: Visceral Artery Intervention, possible aortic thrombectomy;  Surgeon: Algernon Huxley, MD;  Location: [place] CV LAB;  Service: Cardiovascular;  Laterality: N/A;    Prior to Admission medications   Medication Sig Start Date End Date Taking? Authorizing Provider  acetaminophen (TYLENOL) 325 MG tablet Take 2 tablets (650 mg total) by mouth every 6 (six) hours as needed for mild pain (or Fever >/= 101). 08/02/17   Gouru, Illene Silver, MD  ALPRAZolam Duanne Moron) 0.5 MG tablet Take 1 tablet (0.5 mg total) by mouth 2 (two) times daily as needed for anxiety. 06/28/17   Olean Ree, MD  amoxicillin-clavulanate (AUGMENTIN) 875-125 MG tablet Take 1 tablet 2 (two) times daily by mouth. 08/27/17   Olean Ree, MD  apixaban (ELIQUIS) 5 MG TABS tablet Take 1 tablet (5 mg total) by mouth 2 (two) times daily. 06/28/17   Olean Ree, MD  aspirin EC 81 MG tablet Take by mouth.    [provider]  atorvastatin (LIPITOR) 80 MG tablet Take 80 mg by mouth at bedtime.    [provider]  clobetasol ointment (TEMOVATE) 0.05 % Apply topically. 06/13/17   [provider]  diclofenac (VOLTAREN) 50 MG EC tablet Take by mouth. 10/25/16   [provider]  esomeprazole (NEXIUM) 40 MG capsule Take 40 mg by mouth 2 (two) times daily before a meal.      [provider]  feeding supplement, ENSURE ENLIVE, (ENSURE ENLIVE) LIQD Take 237 mLs by mouth 3 (three) times daily between meals. 08/17/17   Vickie Epley, MD  gabapentin (NEURONTIN) 300 MG capsule Take 300 mg by mouth 2 (two) times daily.    [provider]  hydrOXYzine (ATARAX/VISTARIL) 50 MG tablet Take by mouth. 01/22/17   [provider]  ibuprofen (ADVIL,MOTRIN) 600 MG tablet Take 1 tablet (600 mg total) by mouth every 8 (eight) hours as needed for mild pain or moderate pain. 08/22/17   Olean Ree, MD  lisinopril (PRINIVIL,ZESTRIL) 40 MG tablet Take 40 mg by mouth daily.    [provider]  metoprolol tartrate (LOPRESSOR) 50 MG tablet Take 50 mg by mouth 2 (two) times daily.    [provider]  nicotine (NICODERM CQ - DOSED IN MG/24 HOURS) 14 mg/24hr patch Place 1 patch (14 mg total) onto the skin daily. 08/03/17   Nicholes Mango, MD  omeprazole (PRILOSEC) 20 MG capsule Take by mouth. 06/13/17   [provider]  ondansetron (ZOFRAN) 4 MG tablet Take 1 tablet (4 mg total) by mouth every 6 (  six) hours as needed for nausea. 08/17/17   Vickie Epley, MD  oxybutynin (DITROPAN) 5 MG tablet Take 1 tablet by mouth 3 (three) times daily as needed. 06/13/17   [provider]  oxybutynin (DITROPAN) 5 MG tablet Take by mouth. 06/13/17   [provider]  polyethylene glycol (MIRALAX / GLYCOLAX) packet Take 17 g by mouth daily. 08/02/17   Gouru, Illene Silver, MD  senna-docusate (SENOKOT-S) 8.6-50 MG tablet Take 2 tablets by mouth at bedtime as needed for mild constipation. 08/17/17   Vickie Epley, MD  Vitamin D, Ergocalciferol, (DRISDOL) 50000 units CAPS capsule Take 1 capsule by mouth once a week. 06/13/17   [provider]  Vitamin D, Ergocalciferol, (DRISDOL) 50000 units CAPS capsule Take by mouth. 06/13/17   [provider]    Allergies Patient has no known allergies.  Family History  Problem Relation Age of  Onset  . Hypertension Mother   . Heart disease Mother   . Heart attack Mother   . Breast cancer Mother   . Hypertension Father   . Breast cancer Paternal Aunt     Social History Social History   Tobacco Use  . Smoking status: Current Every Day Smoker    Packs/day: 0.50    Types: Cigarettes  . Smokeless tobacco: Never Used  Substance Use Topics  . Alcohol use: No  . Drug use: No    Review of Systems  Constitutional: Negative for fever. Eyes: Negative for visual changes. ENT: Negative for sore throat. Neck: No neck pain  Cardiovascular: Negative for chest pain. Respiratory: Negative for shortness of breath. Gastrointestinal: + R sided abdominal pain. No vomiting or diarrhea. Genitourinary: Negative for dysuria. Musculoskeletal: Negative for back pain. Skin: Negative for rash. Neurological: Negative for headaches, weakness or numbness. Psych: No SI or HI  ____________________________________________   PHYSICAL EXAM:  VITAL SIGNS: ED Triage Vitals  Enc Vitals Group     BP 01/24/18 1754 (!) 156/116     Pulse Rate 01/24/18 1754 (!) 102     Resp 01/24/18 1754 18     Temp 01/24/18 1754 98.6 F (37 C)     Temp Source 01/24/18 1754 Oral     SpO2 01/24/18 1754 100 %     Weight 01/24/18 1756 215 lb (97.5 kg)     Height 01/24/18 1756 5\' 3"  (1.6 m)     Head Circumference --      Peak Flow --      Pain Score 01/24/18 1756 7     Pain Loc --      Pain Edu? --      Excl. in [place]? --     Constitutional: Alert and oriented. Well appearing and in no apparent distress. HEENT:      Head: Normocephalic and atraumatic.         Eyes: Conjunctivae are normal. Sclera is non-icteric.       Mouth/Throat: Mucous membranes are moist.       Neck: Supple with no signs of meningismus. Cardiovascular: Tachycardic with regular rhythm. No murmurs, gallops, or rubs. 2+ symmetrical distal pulses are present in all extremities. No JVD. Respiratory: Normal respiratory effort. Lungs are clear  to auscultation bilaterally. No wheezes, crackles, or rhonchi.  Gastrointestinal: Obese, diffusely tender on the right quadrant, and non distended with hypoactive bowel sounds. No rebound or guarding. Genitourinary: No CVA tenderness. Rectal exam showing red tinged stool guaiac negative Musculoskeletal: Nontender with normal range of motion in all extremities. No edema, cyanosis,  or erythema of extremities. Neurologic: Normal speech and language. Face is symmetric. Moving all extremities. No gross focal neurologic deficits are appreciated. Skin: Skin is warm, dry and intact. No rash noted. Psychiatric: Mood and affect are normal. Speech and behavior are normal.  ____________________________________________   LABS (all labs ordered are listed, but only abnormal results are displayed)  Labs Reviewed  COMPREHENSIVE METABOLIC PANEL - Abnormal; Notable for the following components:      Result Value   Potassium 3.4 (*)    Glucose, Bld 109 (*)    ALT 12 (*)    All other components within normal limits  CBC - Abnormal; Notable for the following components:   MCHC 31.6 (*)    RDW 17.8 (*)    All other components within normal limits  URINALYSIS, COMPLETE (UACMP) WITH MICROSCOPIC - Abnormal; Notable for the following components:   Color, Urine COLORLESS (*)    APPearance CLEAR (*)    Specific Gravity, Urine 1.001 (*)    Hgb urine dipstick SMALL (*)    Squamous Epithelial / LPF 0-5 (*)    All other components within normal limits  LACTIC ACID, PLASMA  POC OCCULT BLOOD, ED  TYPE AND SCREEN   ____________________________________________  EKG  none  ____________________________________________  RADIOLOGY  I have personally reviewed the images performed during this visit and I agree with the Radiologist's read.   Interpretation by Radiologist:  Ct Angio Abd/pel W And/or Wo Contrast  Result Date: 01/24/2018 CLINICAL DATA:  57 y/o F; abdominal pain and blood in the stool. History of  bowel resection and abdominal aortic aneurysm. EXAM: CTA ABDOMEN AND PELVIS wITHOUT AND WITH CONTRAST TECHNIQUE: Multidetector CT imaging of the abdomen and pelvis was performed using the standard protocol during bolus administration of intravenous contrast. Multiplanar reconstructed images and MIPs were obtained and reviewed to evaluate the vascular anatomy. CONTRAST:  192mL ISOVUE-370 IOPAMIDOL (ISOVUE-370) INJECTION 76% COMPARISON:  08/15/2017 CT abdomen and pelvis. FINDINGS: VASCULAR Aorta: Normal caliber aorta without aneurysm, dissection, vasculitis or significant stenosis. Extensive mixed plaque. Celiac: Patent without evidence of aneurysm, dissection, vasculitis or significant stenosis. SMA: Stent within the SMA origin which appears occluded. The SMA downstream to the stent normally enhances which may be due to collateral circulation. Renals: Both renal arteries are patent without evidence of aneurysm, dissection, vasculitis, fibromuscular dysplasia or significant stenosis. IMA: Patent without evidence of aneurysm, dissection, vasculitis or significant stenosis. Inflow: Patent without evidence of aneurysm, dissection, vasculitis or significant stenosis. Proximal Outflow: Bilateral common femoral and visualized portions of the superficial and profunda femoral arteries are patent without evidence of aneurysm, dissection, vasculitis or significant stenosis. Veins: No obvious venous abnormality within the limitations of this arterial phase study. Review of the MIP images confirms the above findings. NON-VASCULAR Lower chest: No acute abnormality. Hepatobiliary: No focal liver abnormality is seen. Status post cholecystectomy. No biliary dilatation. Pancreas: Unremarkable. No pancreatic ductal dilatation or surrounding inflammatory changes. Spleen: Normal in size without focal abnormality. Adrenals/Urinary Tract: Stable left adrenal hypertrophy. No focal kidney lesion. No hydronephrosis. No urinary stone disease.  Normal bladder. Stomach/Bowel: Stomach is within normal limits. Appendix not identified. Partial small-bowel resection with patent right lower quadrant anastomosis. No evidence of bowel wall thickening, distention, or inflammatory changes. Lymphatic: No enlarged abdominal or pelvic lymph nodes. Reproductive: Status post hysterectomy. No adnexal masses. Other: Postsurgical changes within the lower anterior abdominal wall with diastasis rectus and a broad-based hernia containing loops of small and large bowel, no associated bowel obstruction or edema.  Musculoskeletal: No fracture is seen. IMPRESSION: VASCULAR 1. SMA origin stent appears occluded. The SMA downstream to the stent normally enhances, probably due to collateral circulation. 2. No dissection or aneurysm. Extensive abdominal aortic atherosclerosis. NON-VASCULAR Postsurgical changes in the abdominal wall with a broad-based hernia and partial small bowel resection with patent anastomosis. No acute process identified. Electronically Signed   By: Kristine Garbe M.D.   On: 01/24/2018 20:10     ____________________________________________   PROCEDURES  Procedure(s) performed: None Procedures Critical Care performed: yes  CRITICAL CARE Performed by: Rudene Re  ?  Total critical care time: 35 min  Critical care time was exclusive of separately billable procedures and treating other patients.  Critical care was necessary to treat or prevent imminent or life-threatening deterioration.  Critical care was time spent personally by me on the following activities: development of treatment plan with patient and/or surrogate as well as nursing, discussions with consultants, evaluation of patient's response to treatment, examination of patient, obtaining history from patient or surrogate, ordering and performing treatments and interventions, ordering and review of laboratory studies, ordering and review of radiographic studies, pulse  oximetry and re-evaluation of patient's condition.  ____________________________________________   INITIAL IMPRESSION / ASSESSMENT AND PLAN / ED COURSE  57 y.o. female mesenteric ischemia from acute emboli status post mechanical thrombectomy, directed TPA and stenting of the SMA in 2018, on Eliquis, SBO with bowel resection, CHF, HTN, HLD who presents for evaluation of abdominal pain x 1 week.  Patient is well-appearing, slightly tachycardic, abdomen is obese, tender to palpation on the right quadrants, no rebound or guarding.  CT was done showing occlusion of mesenteric a stent with some collateral flow to the gut, no dead bowel, normal white count and lactic.  Discussed with Dr. Delana Meyer and expressed my concerns the patient will not be able to get outpatient care due to lack of insurance.  He recommended starting patient on heparin and admitting to the hospitalist for pain control and vascular evaluation.  Dr. Rosana Hoes, surgery was also consulted but according to him no surgical intervention at this time.  Rectal exam was done showing red tinged stool but guaiac negative with normal hemoglobin.  No signs of active bleeding.  Heparin will be started.  Pain control with morphine.  Discussed with the hospitalist for admission.      As part of my medical decision making, I reviewed the following data within the [place] notes reviewed and incorporated, Labs reviewed , EKG interpreted , Old chart reviewed, Radiograph reviewed , Discussed with admitting physician , A consult was requested and obtained from this/these consultant(s) Vascular surgery and general surgery, Notes from prior ED visits and [place] Controlled Substance Database    Pertinent labs & imaging results that were available during my care of the patient were reviewed by me and considered in my medical decision making (see chart for details).    ____________________________________________   FINAL CLINICAL  IMPRESSION(S) / ED DIAGNOSES  Final diagnoses:  Mesenteric ischemia ([place])      NEW MEDICATIONS STARTED DURING THIS VISIT:  ED Discharge Orders    None       Note:  This document was prepared using Dragon voice recognition software and may include unintentional dictation errors.    Rudene Re, MD 01/24/18 [phone]

## 2018-01-24 NOTE — H&P (Signed)
[facility] NAME: Carla [name]    MR#:  [id number]  DATE OF BIRTH:  07-01-1961  DATE OF ADMISSION:  01/24/2018  PRIMARY CARE PHYSICIAN: Irean Hong, MD   REQUESTING/REFERRING PHYSICIAN: Alfred Levins, MD  CHIEF COMPLAINT:   Chief Complaint  Patient presents with  . Abdominal Pain    HISTORY OF PRESENT ILLNESS:  Carla [name]  is a 57 y.o. female who presents with severe abdominal pain.  Patient states that she has been having some abdominal distention getting worse over the past couple of days.  Today she developed severe pain.  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  CT abdomen pelvis today shows thrombosis of her SMA stent.  Vascular surgery contacted by ED physician and request admission with heparin drip and plan for procedure to reopen her stent tomorrow.  Hospitalist were called for admission  PAST MEDICAL HISTORY:   Past Medical History:  Diagnosis Date  . Abdominal aortic atherosclerosis ([place])    a. 05/2017 CTA abd/pelvis: significant atherosclerotic dzs of the infrarenal abd Ao w/ some mural thrombus. No aneurysm or dissection.  . Abdominal pain   . Acute focal ischemia of small intestine (HCC)   . Acute right-sided low back pain with right-sided sciatica 06/13/2017  . AKI (acute kidney injury) ([place]) 07/29/2017  . Baker's cyst of knee, right    a. 07/2016 U/S: 4.1 x 1.4 x 2.9 cystic structure in R poplitetal fossa.  . Bell's palsy   . Cancer ([place])   . Chest pain    a. 08/2012 Lexiscan MV: EF 54%, non ischemia/infarct.  . Chronic idiopathic constipation   . Chronic mesenteric ischemia ([place])   . Diastolic dysfunction    a. 05/2017 Echo: Ef 60-65%, no rwma, Gr1 DD, no source of cardiac emboli.  . 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 05/2017 CTA Abd/pelvis: apparent thrombus or embolus in prox SMA  (70-90%); b. 05/2017 catheter directed tPA, mechanical thrombectomy, and stenting of the SMA.  . Essential hypertension with goal blood pressure less than 140/90 01/19/2016  . Gastroesophageal reflux disease 01/19/2016  . GERD (gastroesophageal reflux disease)   . Hyperlipidemia   . Hypertension   . Hypotension 07/29/2017  . Morbid obesity with BMI of 40.0-44.9, adult ([place])   . Occlusive mesenteric ischemia ([place]) 06/19/2017  . Personal history of other malignant neoplasm of skin 06/01/2016  . Primary osteoarthritis of both knees 06/13/2017  . Recurrent major depressive disorder ([place]) 01/19/2016  . SBO (small bowel obstruction) ([place]) 08/06/2017  . 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 Urinary tract infection 01/09/2014     PAST SURGICAL HISTORY:   Past Surgical History:  Procedure Laterality Date  . CHOLECYSTECTOMY    . LAPAROTOMY N/A 08/08/2017   Procedure: EXPLORATORY LAPAROTOMY POSSIBLE BOWEL RESECTION;  Surgeon: Jules Husbands, MD;  Location: [facility] ORS;  Service: General;  Laterality: N/A;  . TEE WITHOUT CARDIOVERSION N/A 06/22/2017   Procedure: TRANSESOPHAGEAL ECHOCARDIOGRAM (TEE);  Surgeon: Wellington Hampshire, MD;  Location: [facility] ORS;  Service: Cardiovascular;  Laterality: N/A;  . VAGINAL HYSTERECTOMY    . VISCERAL ARTERY INTERVENTION N/A 06/20/2017   Procedure: Visceral Artery Intervention, possible aortic thrombectomy;  Surgeon: Algernon Huxley, MD;  Location: [facility];  Service: Cardiovascular;  Laterality: N/A;     SOCIAL HISTORY:   Social History  Tobacco Use  . Smoking status: Current Every Day Smoker    Packs/day: 0.50    Types: Cigarettes  . Smokeless tobacco: Never Used  Substance Use Topics  . Alcohol use: No     FAMILY HISTORY:   Family History  Problem Relation Age of Onset  . Hypertension Mother   . Heart disease Mother   . Heart attack Mother   . Breast cancer Mother   . Hypertension Father   . Breast cancer Paternal Aunt      DRUG  ALLERGIES:  No Known Allergies  MEDICATIONS AT HOME:   Prior to Admission medications   Medication Sig Start Date End Date Taking? Authorizing Provider  acetaminophen (TYLENOL) 325 MG tablet Take 2 tablets (650 mg total) by mouth every 6 (six) hours as needed for mild pain (or Fever >/= 101). 08/02/17   Gouru, Illene Silver, MD  ALPRAZolam Duanne Moron) 0.5 MG tablet Take 1 tablet (0.5 mg total) by mouth 2 (two) times daily as needed for anxiety. 06/28/17   Olean Ree, MD  apixaban (ELIQUIS) 5 MG TABS tablet Take 1 tablet (5 mg total) by mouth 2 (two) times daily. 06/28/17   Olean Ree, MD  aspirin EC 81 MG tablet Take by mouth.    [provider]  atorvastatin (LIPITOR) 80 MG tablet Take 80 mg by mouth at bedtime.    [provider]  clobetasol ointment (TEMOVATE) 0.05 % Apply topically. 06/13/17   [provider]  diclofenac (VOLTAREN) 50 MG EC tablet Take by mouth. 10/25/16   [provider]  esomeprazole (NEXIUM) 40 MG capsule Take 40 mg by mouth 2 (two) times daily before a meal.     [provider]  feeding supplement, ENSURE ENLIVE, (ENSURE ENLIVE) LIQD Take 237 mLs by mouth 3 (three) times daily between meals. 08/17/17   Vickie Epley, MD  gabapentin (NEURONTIN) 300 MG capsule Take 300 mg by mouth 2 (two) times daily.    [provider]  hydrOXYzine (ATARAX/VISTARIL) 50 MG tablet Take by mouth. 01/22/17   [provider]  ibuprofen (ADVIL,MOTRIN) 600 MG tablet Take 1 tablet (600 mg total) by mouth every 8 (eight) hours as needed for mild pain or moderate pain. 08/22/17   Olean Ree, MD  lisinopril (PRINIVIL,ZESTRIL) 40 MG tablet Take 40 mg by mouth daily.    [provider]  metoprolol tartrate (LOPRESSOR) 50 MG tablet Take 50 mg by mouth 2 (two) times daily.    [provider]  nicotine (NICODERM CQ - DOSED IN MG/24 HOURS) 14 mg/24hr patch Place 1 patch (14 mg total) onto the skin daily. 08/03/17   Nicholes Mango, MD   omeprazole (PRILOSEC) 20 MG capsule Take by mouth. 06/13/17   [provider]  ondansetron (ZOFRAN) 4 MG tablet Take 1 tablet (4 mg total) by mouth every 6 (six) hours as needed for nausea. 08/17/17   Vickie Epley, MD  oxybutynin (DITROPAN) 5 MG tablet Take 1 tablet by mouth 3 (three) times daily as needed. 06/13/17   [provider]  oxybutynin (DITROPAN) 5 MG tablet Take by mouth. 06/13/17   [provider]  polyethylene glycol (MIRALAX / GLYCOLAX) packet Take 17 g by mouth daily. 08/02/17   Gouru, Illene Silver, MD  senna-docusate (SENOKOT-S) 8.6-50 MG tablet Take 2 tablets by mouth at bedtime as needed for mild constipation. 08/17/17   Vickie Epley, MD  Vitamin D, Ergocalciferol, (DRISDOL) 50000 units CAPS capsule Take 1 capsule by mouth once a week. 06/13/17  [provider]  Vitamin D, Ergocalciferol, (DRISDOL) 50000 units CAPS capsule Take by mouth. 06/13/17   [provider]    REVIEW OF SYSTEMS:  Review of Systems  Constitutional: Negative for chills, fever, malaise/fatigue and weight loss.  HENT: Negative for ear pain, hearing loss and tinnitus.   Eyes: Negative for blurred vision, double vision, pain and redness.  Respiratory: Negative for cough, hemoptysis and shortness of breath.   Cardiovascular: Negative for chest pain, palpitations, orthopnea and leg swelling.  Gastrointestinal: Positive for abdominal pain. Negative for constipation, diarrhea, nausea and vomiting.  Genitourinary: Negative for dysuria, frequency and hematuria.  Musculoskeletal: Negative for back pain, joint pain and neck pain.  Skin:       No acne, rash, or lesions  Neurological: Negative for dizziness, tremors, focal weakness and weakness.  Endo/Heme/Allergies: Negative for polydipsia. Does not bruise/bleed easily.  Psychiatric/Behavioral: Negative for depression. The patient is not nervous/anxious and does not have insomnia.      VITAL SIGNS:   Vitals:    01/24/18 1754 01/24/18 1756 01/24/18 2017  BP: (!) 156/116  (!) 168/104  Pulse: (!) 102  (!) 104  Resp: 18  20  Temp: 98.6 F (37 C)    TempSrc: Oral    SpO2: 100%  98%  Weight:  97.5 kg (215 lb)   Height:  5\' 3"  (1.6 m)    Wt Readings from Last 3 Encounters:  01/24/18 97.5 kg (215 lb)  08/29/17 93.9 kg (207 lb)  08/22/17 95.9 kg (211 lb 6.4 oz)    PHYSICAL EXAMINATION:  Physical Exam  Vitals reviewed. Constitutional: She is oriented to person, place, and time. She appears well-developed and well-nourished. No distress.  HENT:  Head: Normocephalic and atraumatic.  Mouth/Throat: Oropharynx is clear and moist.  Eyes: Pupils are equal, round, and reactive to light. Conjunctivae and EOM are normal. No scleral icterus.  Neck: Normal range of motion. Neck supple. No JVD present. No thyromegaly present.  Cardiovascular: Normal rate, regular rhythm and intact distal pulses. Exam reveals no gallop and no friction rub.  No murmur heard. Respiratory: Effort normal and breath sounds normal. No respiratory distress. She has no wheezes. She has no rales.  GI: Soft. Bowel sounds are normal. She exhibits no distension. There is tenderness (Mild, diffuse).  Musculoskeletal: Normal range of motion. She exhibits no edema.  No arthritis, no gout  Lymphadenopathy:    She has no cervical adenopathy.  Neurological: She is alert and oriented to person, place, and time. No cranial nerve deficit.  No dysarthria, no aphasia  Skin: Skin is warm and dry. No rash noted. No erythema.  Psychiatric: She has a normal mood and affect. Her behavior is normal. Judgment and thought content normal.    LABORATORY PANEL:   CBC Recent Labs  Lab 01/24/18 1808  WBC 8.2  HGB 12.5  HCT 39.4  PLT 309   ------------------------------------------------------------------------------------------------------------------  Chemistries  Recent Labs  Lab 01/24/18 1808  NA 138  K 3.4*  CL 107  CO2 22  GLUCOSE  109*  BUN 9  CREATININE 0.98  CALCIUM 8.9  AST 17  ALT 12*  ALKPHOS 91  BILITOT 0.4   ------------------------------------------------------------------------------------------------------------------  Cardiac Enzymes No results for input(s): TROPONINI in the last 168 hours. ------------------------------------------------------------------------------------------------------------------  RADIOLOGY:  Ct Angio Abd/pel W And/or Wo Contrast  Result Date: 01/24/2018 CLINICAL DATA:  57 y/o F; abdominal pain and blood in the stool. History of bowel resection and abdominal aortic aneurysm. EXAM: CTA ABDOMEN AND  PELVIS wITHOUT AND WITH CONTRAST TECHNIQUE: Multidetector CT imaging of the abdomen and pelvis was performed using the standard protocol during bolus administration of intravenous contrast. Multiplanar reconstructed images and MIPs were obtained and reviewed to evaluate the vascular anatomy. CONTRAST:  142mL ISOVUE-370 IOPAMIDOL (ISOVUE-370) INJECTION 76% COMPARISON:  08/15/2017 CT abdomen and pelvis. FINDINGS: VASCULAR Aorta: Normal caliber aorta without aneurysm, dissection, vasculitis or significant stenosis. Extensive mixed plaque. Celiac: Patent without evidence of aneurysm, dissection, vasculitis or significant stenosis. SMA: Stent within the SMA origin which appears occluded. The SMA downstream to the stent normally enhances which may be due to collateral circulation. Renals: Both renal arteries are patent without evidence of aneurysm, dissection, vasculitis, fibromuscular dysplasia or significant stenosis. IMA: Patent without evidence of aneurysm, dissection, vasculitis or significant stenosis. Inflow: Patent without evidence of aneurysm, dissection, vasculitis or significant stenosis. Proximal Outflow: Bilateral common femoral and visualized portions of the superficial and profunda femoral arteries are patent without evidence of aneurysm, dissection, vasculitis or significant stenosis.  Veins: No obvious venous abnormality within the limitations of this arterial phase study. Review of the MIP images confirms the above findings. NON-VASCULAR Lower chest: No acute abnormality. Hepatobiliary: No focal liver abnormality is seen. Status post cholecystectomy. No biliary dilatation. Pancreas: Unremarkable. No pancreatic ductal dilatation or surrounding inflammatory changes. Spleen: Normal in size without focal abnormality. Adrenals/Urinary Tract: Stable left adrenal hypertrophy. No focal kidney lesion. No hydronephrosis. No urinary stone disease. Normal bladder. Stomach/Bowel: Stomach is within normal limits. Appendix not identified. Partial small-bowel resection with patent right lower quadrant anastomosis. No evidence of bowel wall thickening, distention, or inflammatory changes. Lymphatic: No enlarged abdominal or pelvic lymph nodes. Reproductive: Status post hysterectomy. No adnexal masses. Other: Postsurgical changes within the lower anterior abdominal wall with diastasis rectus and a broad-based hernia containing loops of small and large bowel, no associated bowel obstruction or edema. Musculoskeletal: No fracture is seen. IMPRESSION: VASCULAR 1. SMA origin stent appears occluded. The SMA downstream to the stent normally enhances, probably due to collateral circulation. 2. No dissection or aneurysm. Extensive abdominal aortic atherosclerosis. NON-VASCULAR Postsurgical changes in the abdominal wall with a broad-based hernia and partial small bowel resection with patent anastomosis. No acute process identified. Electronically Signed   By: Kristine Garbe M.D.   On: 01/24/2018 20:10    EKG:   Orders placed or performed during the hospital encounter of 08/06/17  . EKG 12-Lead  . EKG 12-Lead  . EKG 12-Lead  . EKG 12-Lead    IMPRESSION AND PLAN:  Principal Problem:   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HCC) -heparin drip, vascular surgery consult, monitor closely for any change  in her abdominal exam Active Problems:   Essential hypertension -stable, continue home meds, blood pressure goal here is less than 160/100   Adjustment disorder with anxiety -home dose anxiolytics   Gastroesophageal reflux disease -home dose PPI   Hyperlipidemia -home dose antilipid  Chart review performed and case discussed with ED provider. Labs, imaging and/or ECG reviewed by provider and discussed with patient/family. Management plans discussed with the patient and/or family.  DVT PROPHYLAXIS: Systemic anticoagulation  GI PROPHYLAXIS: PPI  ADMISSION STATUS: Inpatient  CODE STATUS: Full Code Status History    Date Active Date Inactive Code Status Order ID Comments User Context   08/06/2017 2022 08/17/2017 2158 Full Code [id number]  Florene Glen, MD ED   07/29/2017 1600 08/02/2017 1814 Full Code [id number]  Gorden Harms, MD Inpatient   06/19/2017 2019 06/28/2017 1638 Full Code [id number]  Rosana Hoes,  Arn Medal, MD ED      TOTAL TIME TAKING CARE OF THIS PATIENT: 45 minutes.   Chosen Garron [place] 01/24/2018, 9:38 PM  [facility]  Office  [phone]  CC: Primary care physician; Irean Hong, MD  Note:  This document was prepared using Dragon voice recognition software and may include unintentional dictation errors.

## 2018-01-24 NOTE — ED Notes (Signed)
Apolonio Schneiders RN aware of bed assigned

## 2018-01-25 DIAGNOSIS — K55011 Focal (segmental) acute (reversible) ischemia of small intestine: Secondary | ICD-10-CM

## 2018-01-25 DIAGNOSIS — R109 Unspecified abdominal pain: Secondary | ICD-10-CM

## 2018-01-25 DIAGNOSIS — I10 Essential (primary) hypertension: Secondary | ICD-10-CM

## 2018-01-25 LAB — BASIC METABOLIC PANEL
Anion gap: 9 (ref 5–15)
BUN: 7 mg/dL (ref 6–20)
CO2: 24 mmol/L (ref 22–32)
Calcium: 8.5 mg/dL — ABNORMAL LOW (ref 8.9–10.3)
Chloride: 107 mmol/L (ref 101–111)
Creatinine, Ser: 0.95 mg/dL (ref 0.44–1.00)
GFR calc Af Amer: >60 mL/min (ref >60)
GFR calc non Af Amer: >60 mL/min (ref >60)
Glucose, Bld: 101 mg/dL — ABNORMAL HIGH (ref 65–99)
Potassium: 3.7 mmol/L (ref 3.5–5.1)
Sodium: 140 mmol/L (ref 135–145)

## 2018-01-25 LAB — HEPARIN LEVEL (UNFRACTIONATED)
Heparin Unfractionated: 0.96 IU/mL — ABNORMAL HIGH (ref 0.30–0.70)
Heparin Unfractionated: 0.77 IU/mL — ABNORMAL HIGH (ref 0.30–0.70)
Heparin Unfractionated: 0.4 IU/mL (ref 0.30–0.70)

## 2018-01-25 LAB — CBC
HCT: 34.3 % — ABNORMAL LOW (ref 35.0–47.0)
Hemoglobin: 10.9 g/dL — ABNORMAL LOW (ref 12.0–16.0)
MCH: 26.1 pg (ref 26.0–34.0)
MCHC: 31.8 g/dL — ABNORMAL LOW (ref 32.0–36.0)
MCV: 82.1 fL (ref 80.0–100.0)
Platelets: 285 10*3/uL (ref 150–440)
RBC: 4.18 MIL/uL (ref 3.80–5.20)
RDW: 17.7 % — ABNORMAL HIGH (ref 11.5–14.5)
WBC: 7.1 10*3/uL (ref 3.6–11.0)

## 2018-01-25 MED ORDER — MORPHINE SULFATE (PF) 4 MG/ML IV SOLN
4.0000 mg | INTRAVENOUS | Status: DC | PRN
  Administered 2018-01-25 – 2018-01-29 (×11): 4 mg via INTRAVENOUS
  Filled 2018-01-25 (×10): qty 1

## 2018-01-25 MED ORDER — SODIUM CHLORIDE 0.9 % IV SOLN
INTRAVENOUS | Status: AC
  Administered 2018-01-25: 14:00:00 via INTRAVENOUS

## 2018-01-25 NOTE — Progress Notes (Addendum)
ANTICOAGULATION CONSULT NOTE  Pharmacy Consult for Heparin  Indication: occluded SMA stent  No Known Allergies  Patient Measurements: Height: 5\' 3"  (160 cm) Weight: 215 lb (97.5 kg) IBW/kg (Calculated) : 52.4 Heparin Dosing Weight: 75.1 kg   Vital Signs: Temp: 97.7 F (36.5 C) (04/05 0520) Temp Source: Oral (04/05 0520) BP: 121/99 (04/05 1408) Pulse Rate: 78 (04/05 1408)  Labs: Recent Labs    01/24/18 1808 01/24/18 2109 01/25/18 0325 01/25/18 1429  HGB 12.5  --  10.9*  --   HCT 39.4  --  34.3*  --   PLT 309  --  285  --   APTT  --  28  --   --   LABPROT  --  12.6  --   --   INR  --  0.95  --   --   HEPARINUNFRC  --  <0.10* 0.96* 0.77*  CREATININE 0.98  --  0.95  --     Estimated Creatinine Clearance: 71.7 mL/min (by C-G formula based on SCr of 0.95 mg/dL).   Medical History: Past Medical History:  Diagnosis Date  . Abdominal aortic atherosclerosis ([place])    a. 05/2017 CTA abd/pelvis: significant atherosclerotic dzs of the infrarenal abd Ao w/ some mural thrombus. No aneurysm or dissection.  . Abdominal pain   . Acute focal ischemia of small intestine (HCC)   . Acute right-sided low back pain with right-sided sciatica 06/13/2017  . AKI (acute kidney injury) ([place]) 07/29/2017  . Baker's cyst of knee, right    a. 07/2016 U/S: 4.1 x 1.4 x 2.9 cystic structure in R poplitetal fossa.  . Bell's palsy   . Cancer ([place])   . Chest pain    a. 08/2012 Lexiscan MV: EF 54%, non ischemia/infarct.  . Chronic idiopathic constipation   . Chronic mesenteric ischemia ([place])   . Diastolic dysfunction    a. 05/2017 Echo: Ef 60-65%, no rwma, Gr1 DD, no source of cardiac emboli.  . 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 05/2017 CTA Abd/pelvis: apparent thrombus or embolus in prox SMA (70-90%); b. 05/2017 catheter directed tPA, mechanical thrombectomy, and stenting of the SMA.  . Essential hypertension with goal blood pressure less than 140/90 01/19/2016  . Gastroesophageal  reflux disease 01/19/2016  . GERD (gastroesophageal reflux disease)   . Hyperlipidemia   . Hypertension   . Hypotension 07/29/2017  . Morbid obesity with BMI of 40.0-44.9, adult ([place])   . Occlusive mesenteric ischemia ([place]) 06/19/2017  . Personal history of other malignant neoplasm of skin 06/01/2016  . Primary osteoarthritis of both knees 06/13/2017  . Recurrent major depressive disorder ([place]) 01/19/2016  . SBO (small bowel obstruction) ([place]) 08/06/2017  . 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 Urinary tract infection 01/09/2014    Medications:  Facility-Administered Medications Prior to Admission  Medication Dose Route Frequency Provider Last Rate Last Dose  . [DISCONTINUED] amoxicillin-clavulanate (AUGMENTIN) 875-125 MG per tablet 1 tablet  1 tablet Oral Q12H Piscoya, Jose, MD       Medications Prior to Admission  Medication Sig Dispense Refill Last Dose  . acetaminophen (TYLENOL) 325 MG tablet Take 2 tablets (650 mg total) by mouth every 6 (six) hours as needed for mild pain (or Fever >/= 101).   prn at prn  . apixaban (ELIQUIS) 5 MG TABS tablet Take 1 tablet (5 mg total) by mouth 2 (two) times daily. 60 tablet 5 01/23/2018 at Unknown time  . aspirin EC 81 MG tablet Take  81 mg by mouth daily.    01/23/2018 at Unknown time  . atorvastatin (LIPITOR) 80 MG tablet Take 80 mg by mouth at bedtime.   01/23/2018 at Unknown time  . gabapentin (NEURONTIN) 300 MG capsule Take 300 mg by mouth 3 (three) times daily.    01/23/2018 at Unknown time  . ibuprofen (ADVIL,MOTRIN) 600 MG tablet Take 1 tablet (600 mg total) by mouth every 8 (eight) hours as needed for mild pain or moderate pain. 30 tablet 0 prn at prn  . lisinopril (PRINIVIL,ZESTRIL) 40 MG tablet Take 40 mg by mouth daily.   01/23/2018 at Unknown time  . metoprolol tartrate (LOPRESSOR) 50 MG tablet Take 50 mg by mouth 2 (two) times daily.   01/23/2018 at Unknown time  . omeprazole (PRILOSEC) 20 MG capsule Take 20 mg by mouth daily.    01/23/2018 at  Unknown time  . ondansetron (ZOFRAN) 4 MG tablet Take 1 tablet (4 mg total) by mouth every 6 (six) hours as needed for nausea. 20 tablet 0 prn at prn  . senna-docusate (SENOKOT-S) 8.6-50 MG tablet Take 2 tablets by mouth at bedtime as needed for mild constipation. (Patient taking differently: Take 2 tablets by mouth at bedtime. )   01/23/2018 at Unknown time    Assessment: Pharmacy consulted to dose heparin in this 57 year old female admitted with occluded SMA stent.  Pt was on eliquis at home, pt states last dose was 4/3 AM.  CrCl = 69.5 ml/min  Goal of Therapy:  Heparin level 0.3-0.7 units/ml Monitor platelets by anticoagulation protocol: Yes   Plan:  Give 4500 units bolus x 1 Start heparin infusion at 1300 units/hr Check anti-Xa level in 6 hours and daily while on heparin Continue to monitor H&H and platelets  Will monitor aPTT if baseline HL is elevated.   HL = 0.77, supratherapeutic. I will decrease the rate to 1050 units/hr and obtain a follow up level in 6 hours. Pharmacy will continue to follow and adjust.    Lendon Ka, PharmD Pharmacy Resident 01/25/2018,3:46 PM    4/5 PM heparin level 0.40. Continue current regimen. Recheck heparin level and CBC with tomorrow AM labs.  Sim Boast, PharmD, BCPS  01/25/18 10:52 PM

## 2018-01-25 NOTE — Progress Notes (Signed)
[place] [facility] [place] NAME: Carla [name]    MR#:  [id number]  DATE OF BIRTH:  September 23, 1961  SUBJECTIVE:  CHIEF COMPLAINT:   Chief Complaint  Patient presents with  . Abdominal Pain   She has a history of superior mesenteric artery stenting.  Had severe abdominal pain so came to the emergency room and CT angiogram of the abdomen showed in-stent thrombosis.  Vascular had advised to start on heparin IV drip for possible procedure. Patient still had significant abdominal pain when I saw her.  REVIEW OF SYSTEMS:  CONSTITUTIONAL: No fever, fatigue or weakness.  EYES: No blurred or double vision.  EARS, NOSE, AND THROAT: No tinnitus or ear pain.  RESPIRATORY: No cough, shortness of breath, wheezing or hemoptysis.  CARDIOVASCULAR: No chest pain, orthopnea, edema.  GASTROINTESTINAL: No nausea, vomiting, diarrhea ,have  abdominal pain.  GENITOURINARY: No dysuria, hematuria.  ENDOCRINE: No polyuria, nocturia,  HEMATOLOGY: No anemia, easy bruising or bleeding SKIN: No rash or lesion. MUSCULOSKELETAL: No joint pain or arthritis.   NEUROLOGIC: No tingling, numbness, weakness.  PSYCHIATRY: No anxiety or depression.   ROS  DRUG ALLERGIES:  No Known Allergies  VITALS:  Blood pressure 118/80, pulse 61, temperature 98.6 F (37 C), resp. rate 18, height 5\' 3"  (1.6 m), weight 97.5 kg (215 lb), SpO2 97 %.  PHYSICAL EXAMINATION:  GENERAL:  57 y.o.-year-old patient lying in the bed with no acute distress.  EYES: Pupils equal, round, reactive to light and accommodation. No scleral icterus. Extraocular muscles intact.  HEENT: Head atraumatic, normocephalic. Oropharynx and nasopharynx clear.  NECK:  Supple, no jugular venous distention. No thyroid enlargement, no tenderness.  LUNGS: Normal breath sounds bilaterally, no wheezing, rales,rhonchi or crepitation. No use of accessory muscles of respiration.  CARDIOVASCULAR: S1, S2 normal. No murmurs, rubs, or gallops.   ABDOMEN: Soft, nontender, nondistended. Bowel sounds present. No organomegaly or mass.  EXTREMITIES: No pedal edema, cyanosis, or clubbing.  NEUROLOGIC: Cranial nerves II through XII are intact. Muscle strength 5/5 in all extremities. Sensation intact. Gait not checked.  PSYCHIATRIC: The patient is alert and oriented x 3.  SKIN: No obvious rash, lesion, or ulcer.   Physical Exam LABORATORY PANEL:   CBC Recent Labs  Lab 01/25/18 0325  WBC 7.1  HGB 10.9*  HCT 34.3*  PLT 285   ------------------------------------------------------------------------------------------------------------------  Chemistries  Recent Labs  Lab 01/24/18 1808 01/25/18 0325  NA 138 140  K 3.4* 3.7  CL 107 107  CO2 22 24  GLUCOSE 109* 101*  BUN 9 7  CREATININE 0.98 0.95  CALCIUM 8.9 8.5*  AST 17  --   ALT 12*  --   ALKPHOS 91  --   BILITOT 0.4  --    ------------------------------------------------------------------------------------------------------------------  Cardiac Enzymes No results for input(s): TROPONINI in the last 168 hours. ------------------------------------------------------------------------------------------------------------------  RADIOLOGY:  Ct Angio Abd/pel W And/or Wo Contrast  Result Date: 01/24/2018 CLINICAL DATA:  57 y/o F; abdominal pain and blood in the stool. History of bowel resection and abdominal aortic aneurysm. EXAM: CTA ABDOMEN AND PELVIS wITHOUT AND WITH CONTRAST TECHNIQUE: Multidetector CT imaging of the abdomen and pelvis was performed using the standard protocol during bolus administration of intravenous contrast. Multiplanar reconstructed images and MIPs were obtained and reviewed to evaluate the vascular anatomy. CONTRAST:  174mL ISOVUE-370 IOPAMIDOL (ISOVUE-370) INJECTION 76% COMPARISON:  08/15/2017 CT abdomen and pelvis. FINDINGS: VASCULAR Aorta: Normal caliber aorta without aneurysm, dissection, vasculitis or significant stenosis. Extensive mixed plaque.  Celiac: Patent without evidence of aneurysm, dissection, vasculitis or significant stenosis. SMA: Stent within the SMA origin which appears occluded. The SMA downstream to the stent normally enhances which may be due to collateral circulation. Renals: Both renal arteries are patent without evidence of aneurysm, dissection, vasculitis, fibromuscular dysplasia or significant stenosis. IMA: Patent without evidence of aneurysm, dissection, vasculitis or significant stenosis. Inflow: Patent without evidence of aneurysm, dissection, vasculitis or significant stenosis. Proximal Outflow: Bilateral common femoral and visualized portions of the superficial and profunda femoral arteries are patent without evidence of aneurysm, dissection, vasculitis or significant stenosis. Veins: No obvious venous abnormality within the limitations of this arterial phase study. Review of the MIP images confirms the above findings. NON-VASCULAR Lower chest: No acute abnormality. Hepatobiliary: No focal liver abnormality is seen. Status post cholecystectomy. No biliary dilatation. Pancreas: Unremarkable. No pancreatic ductal dilatation or surrounding inflammatory changes. Spleen: Normal in size without focal abnormality. Adrenals/Urinary Tract: Stable left adrenal hypertrophy. No focal kidney lesion. No hydronephrosis. No urinary stone disease. Normal bladder. Stomach/Bowel: Stomach is within normal limits. Appendix not identified. Partial small-bowel resection with patent right lower quadrant anastomosis. No evidence of bowel wall thickening, distention, or inflammatory changes. Lymphatic: No enlarged abdominal or pelvic lymph nodes. Reproductive: Status post hysterectomy. No adnexal masses. Other: Postsurgical changes within the lower anterior abdominal wall with diastasis rectus and a broad-based hernia containing loops of small and large bowel, no associated bowel obstruction or edema. Musculoskeletal: No fracture is seen. IMPRESSION:  VASCULAR 1. SMA origin stent appears occluded. The SMA downstream to the stent normally enhances, probably due to collateral circulation. 2. No dissection or aneurysm. Extensive abdominal aortic atherosclerosis. NON-VASCULAR Postsurgical changes in the abdominal wall with a broad-based hernia and partial small bowel resection with patent anastomosis. No acute process identified. Electronically Signed   By: Kristine Garbe M.D.   On: 01/24/2018 20:10    ASSESSMENT AND PLAN:   Principal Problem:   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HCC) Active Problems:   Essential hypertension with goal blood pressure less than 140/90   Gastroesophageal reflux disease   Hyperlipidemia   Adjustment disorder with anxiety  * 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HCC) -heparin drip, vascular surgery consult, monitor closely for any change in her abdominal exam Vascular suggested to start her on diet and possibly angiogram on Monday. *  Essential hypertension -stable, continue home meds, blood pressure goal here is less than 160/100 *  Adjustment disorder with anxiety -home dose anxiolytics  * Gastroesophageal reflux disease -home dose PPI  * Hyperlipidemia -home dose antilipid    All the records are reviewed and case discussed with Care Management/Social Workerr. Management plans discussed with the patient, family and they are in agreement.  CODE STATUS: full.  TOTAL TIME TAKING CARE OF THIS PATIENT: 35  minutes.     POSSIBLE D/C IN 1-2 DAYS, DEPENDING ON CLINICAL CONDITION.   Vaughan Basta M.D on 01/25/2018   Between 7am to 6pm - Pager - [phone]  After 6pm go to [email or web address] - password [email or web address] [facility]  Office  [phone]  CC: Primary care physician; Irean Hong, MD  Note: This dictation was prepared with Dragon dictation along with smaller phrase technology. Any transcriptional errors that result from this process are unintentional.

## 2018-01-25 NOTE — Progress Notes (Signed)
ANTICOAGULATION CONSULT NOTE - Initial Consult  Pharmacy Consult for Heparin  Indication: occluded SMA stent  No Known Allergies  Patient Measurements: Height: 5\' 3"  (160 cm) Weight: 215 lb (97.5 kg) IBW/kg (Calculated) : 52.4 Heparin Dosing Weight: 75.1 kg   Vital Signs: Temp: 97.7 F (36.5 C) (04/05 0520) Temp Source: Oral (04/05 0520) BP: 98/66 (04/05 0520) Pulse Rate: 67 (04/05 0520)  Labs: Recent Labs    01/24/18 1808 01/24/18 2109 01/25/18 0325  HGB 12.5  --  10.9*  HCT 39.4  --  34.3*  PLT 309  --  285  APTT  --  28  --   LABPROT  --  12.6  --   INR  --  0.95  --   HEPARINUNFRC  --  <0.10* 0.96*  CREATININE 0.98  --  0.95    Estimated Creatinine Clearance: 71.7 mL/min (by C-G formula based on SCr of 0.95 mg/dL).   Medical History: Past Medical History:  Diagnosis Date  . Abdominal aortic atherosclerosis ([place])    a. 05/2017 CTA abd/pelvis: significant atherosclerotic dzs of the infrarenal abd Ao w/ some mural thrombus. No aneurysm or dissection.  . Abdominal pain   . Acute focal ischemia of small intestine (HCC)   . Acute right-sided low back pain with right-sided sciatica 06/13/2017  . AKI (acute kidney injury) ([place]) 07/29/2017  . Baker's cyst of knee, right    a. 07/2016 U/S: 4.1 x 1.4 x 2.9 cystic structure in R poplitetal fossa.  . Bell's palsy   . Cancer ([place])   . Chest pain    a. 08/2012 Lexiscan MV: EF 54%, non ischemia/infarct.  . Chronic idiopathic constipation   . Chronic mesenteric ischemia ([place])   . Diastolic dysfunction    a. 05/2017 Echo: Ef 60-65%, no rwma, Gr1 DD, no source of cardiac emboli.  . 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[place])    a. 05/2017 CTA Abd/pelvis: apparent thrombus or embolus in prox SMA (70-90%); b. 05/2017 catheter directed tPA, mechanical thrombectomy, and stenting of the SMA.  . Essential hypertension with goal blood pressure less than 140/90 01/19/2016  . Gastroesophageal reflux disease 01/19/2016  . GERD  (gastroesophageal reflux disease)   . Hyperlipidemia   . Hypertension   . Hypotension 07/29/2017  . Morbid obesity with BMI of 40.0-44.9, adult ([place])   . Occlusive mesenteric ischemia ([place]) 06/19/2017  . Personal history of other malignant neoplasm of skin 06/01/2016  . Primary osteoarthritis of both knees 06/13/2017  . Recurrent major depressive disorder ([place]) 01/19/2016  . SBO (small bowel obstruction) ([place]) 08/06/2017  . 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[place])   . Urinary tract infection 01/09/2014    Medications:  Facility-Administered Medications Prior to Admission  Medication Dose Route Frequency Provider Last Rate Last Dose  . [DISCONTINUED] amoxicillin-clavulanate (AUGMENTIN) 875-125 MG per tablet 1 tablet  1 tablet Oral Q12H Piscoya, Jose, MD       Medications Prior to Admission  Medication Sig Dispense Refill Last Dose  . acetaminophen (TYLENOL) 325 MG tablet Take 2 tablets (650 mg total) by mouth every 6 (six) hours as needed for mild pain (or Fever >/= 101).   prn at prn  . apixaban (ELIQUIS) 5 MG TABS tablet Take 1 tablet (5 mg total) by mouth 2 (two) times daily. 60 tablet 5 01/23/2018 at Unknown time  . aspirin EC 81 MG tablet Take 81 mg by mouth daily.    01/23/2018 at Unknown time  . atorvastatin (LIPITOR) 80 MG tablet Take 80  mg by mouth at bedtime.   01/23/2018 at Unknown time  . gabapentin (NEURONTIN) 300 MG capsule Take 300 mg by mouth 3 (three) times daily.    01/23/2018 at Unknown time  . ibuprofen (ADVIL,MOTRIN) 600 MG tablet Take 1 tablet (600 mg total) by mouth every 8 (eight) hours as needed for mild pain or moderate pain. 30 tablet 0 prn at prn  . lisinopril (PRINIVIL,ZESTRIL) 40 MG tablet Take 40 mg by mouth daily.   01/23/2018 at Unknown time  . metoprolol tartrate (LOPRESSOR) 50 MG tablet Take 50 mg by mouth 2 (two) times daily.   01/23/2018 at Unknown time  . omeprazole (PRILOSEC) 20 MG capsule Take 20 mg by mouth daily.    01/23/2018 at Unknown time  . ondansetron  (ZOFRAN) 4 MG tablet Take 1 tablet (4 mg total) by mouth every 6 (six) hours as needed for nausea. 20 tablet 0 prn at prn  . senna-docusate (SENOKOT-S) 8.6-50 MG tablet Take 2 tablets by mouth at bedtime as needed for mild constipation. (Patient taking differently: Take 2 tablets by mouth at bedtime. )   01/23/2018 at Unknown time    Assessment: Pharmacy consulted to dose heparin in this 57 year old female admitted with occluded SMA stent.  Pt was on eliquis at home, pt states last dose was 4/3 AM.  CrCl = 69.5 ml/min  Goal of Therapy:  Heparin level 0.3-0.7 units/ml Monitor platelets by anticoagulation protocol: Yes   Plan:  Give 4500 units bolus x 1 Start heparin infusion at 1300 units/hr Check anti-Xa level in 6 hours and daily while on heparin Continue to monitor H&H and platelets  Will monitor aPTT if baseline HL is elevated.   04/05 AM heparin level 0.96. Decrease rate to 1150 units/hr. Recheck in 6 hours.  Halei Hanover S 01/25/2018,5:48 AM

## 2018-01-25 NOTE — Clinical Social Work Note (Signed)
CSW consulted for assistance with meds and obtaining a primary care physician. CSW has informed RN CM who will address these issues. Shela Leff MSW,LCSW [phone]

## 2018-01-25 NOTE — Consult Note (Signed)
[facility] Vascular Consult Note  MRN : [id number]  Carla [name] is a 57 y.o. ([birth year]) female who presents with chief complaint of  Chief Complaint  Patient presents with  . Abdominal Pain   History of Present Illness:  The patient is a 57 year old female with a past medical history of hypertension, hyperlipidemia, GERD, prediabetic, peripheral artery disease, chronic tobacco abuse well-known to our clinic and has been treated in the past for superior mesenteric artery thrombosis/stenosis.  The patient was last seen by our practice on July 30, 2017 as a consult.  The patient is s/p a mesenteric angiogram with superior mesenteric artery thrombolysis in October 2018.  The patient was encouraged to follow-up in our office as an outpatient however she did not.  The patient is also status post bowel resection for a small bowel obstruction.   The patient presented to the [facility] emergency department on January 24, 2017 complaining of intermittent abdominal pain since undergoing her small bowel obstruction/mesenteric endovascular intervention in October 2018.  The patient reported to the emergency room staff that over the last week she has been experiencing postprandial pain which has progressively worsened located to the right upper quadrant.  She denied constipation or diarrhea.  She stated she had nausea but no vomiting.  During my interview with the patient today, the patient denied any postprandial pain, nausea, vomiting or constipation.  The patient states that she experiences intermittent abdominal pain and intermittent non-bloody diarrhea.  She notes her intermittent abdominal pain has worsened over the last week which prompted her to seek medical attention in the emergency department however has not worsened since she has been admitted to the hospital. The patient denies any fever.  Of note, the patient was in bed crying asking for a diet as she  was hungry.  CTA of the abdomen and pelvis conducted through the emergency department was notable for SMA origin stent appears occluded. The SMA downstream to the stent normally enhances, probably due to collateral circulation. No dissection or aneurysm. Extensive abdominal aortic atherosclerosis.  The vascular surgery service was consulted by the emergency department Dr. Alfred Levins for possible endovascular intervention.  The patient was admitted and heparin drip was initiated.  Current Facility-Administered Medications  Medication Dose Route Frequency Provider Last Rate Last Dose  . 0.9 %  sodium chloride infusion   Intravenous Continuous Vaughan Basta, MD      . acetaminophen (TYLENOL) tablet 650 mg  650 mg Oral Q6H PRN Lance Coon, MD       Or  . acetaminophen (TYLENOL) suppository 650 mg  650 mg Rectal Q6H PRN Lance Coon, MD      . ALPRAZolam Duanne Moron) tablet 0.5 mg  0.5 mg Oral BID PRN Lance Coon, MD      . aspirin EC tablet 81 mg  81 mg Oral Daily Lance Coon, MD   81 mg at 01/25/18 1014  . atorvastatin (LIPITOR) tablet 80 mg  80 mg Oral Corwin Levins, MD   80 mg at 01/24/18 2335  . gabapentin (NEURONTIN) capsule 300 mg  300 mg Oral BID Lance Coon, MD   300 mg at 01/25/18 1014  . heparin ADULT infusion 100 units/mL (25000 units/225mL sodium chloride 0.45%)  1,150 Units/hr Intravenous Continuous Lance Coon, MD 11.5 mL/hr at 01/25/18 0659 1,150 Units/hr at 01/25/18 0659  . lisinopril (PRINIVIL,ZESTRIL) tablet 40 mg  40 mg Oral Daily Lance Coon, MD      . metoprolol tartrate (LOPRESSOR)  tablet 50 mg  50 mg Oral BID Lance Coon, MD   50 mg at 01/24/18 2335  . morphine 4 MG/ML injection 4 mg  4 mg Intravenous Q2H PRN Vaughan Basta, MD      . nicotine (NICODERM CQ - dosed in mg/24 hours) patch 14 mg  14 mg Transdermal Daily Lance Coon, MD   14 mg at 01/25/18 1016  . ondansetron (ZOFRAN) tablet 4 mg  4 mg Oral Q6H PRN Lance Coon, MD       Or  .  ondansetron [facility]) injection 4 mg  4 mg Intravenous Q6H PRN Lance Coon, MD      . oxyCODONE (Oxy IR/ROXICODONE) immediate release tablet 5 mg  5 mg Oral Q4H PRN Lance Coon, MD   5 mg at 01/25/18 1201  . pantoprazole (PROTONIX) EC tablet 40 mg  40 mg Oral BID Lance Coon, MD   40 mg at 01/25/18 1014  . senna-docusate (Senokot-S) tablet 2 tablet  2 tablet Oral QHS PRN Lance Coon, MD       Past Medical History:  Diagnosis Date  . Abdominal aortic atherosclerosis ([place])    a. 05/2017 CTA abd/pelvis: significant atherosclerotic dzs of the infrarenal abd Ao w/ some mural thrombus. No aneurysm or dissection.  . Abdominal pain   . Acute focal ischemia of small intestine ([facility])   . Acute right-sided low back pain with right-sided sciatica 06/13/2017  . AKI (acute kidney injury) ([place]) 07/29/2017  . Baker's cyst of knee, right    a. 07/2016 U/S: 4.1 x 1.4 x 2.9 cystic structure in R poplitetal fossa.  . Bell's palsy   . Cancer ([place])   . Chest pain    a. 08/2012 Lexiscan MV: EF 54%, non ischemia/infarct.  . Chronic idiopathic constipation   . Chronic mesenteric ischemia ([place])   . Diastolic dysfunction    a. 05/2017 Echo: Ef 60-65%, no rwma, Gr1 DD, no source of cardiac emboli.  . 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)    a. 05/2017 CTA Abd/pelvis: apparent thrombus or embolus in prox SMA (70-90%); b. 05/2017 catheter directed tPA, mechanical thrombectomy, and stenting of the SMA.  . Essential hypertension with goal blood pressure less than 140/90 01/19/2016  . Gastroesophageal reflux disease 01/19/2016  . GERD (gastroesophageal reflux disease)   . Hyperlipidemia   . Hypertension   . Hypotension 07/29/2017  . Morbid obesity with BMI of 40.0-44.9, adult ([place])   . Occlusive mesenteric ischemia ([place]) 06/19/2017  . Personal history of other malignant neoplasm of skin 06/01/2016  . Primary osteoarthritis of both knees 06/13/2017  . Recurrent major depressive disorder ([place]) 01/19/2016  . SBO  (small bowel obstruction) ([place]) 08/06/2017  . 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[place])   . Urinary tract infection 01/09/2014   Past Surgical History:  Procedure Laterality Date  . CHOLECYSTECTOMY    . LAPAROTOMY N/A 08/08/2017   Procedure: EXPLORATORY LAPAROTOMY POSSIBLE BOWEL RESECTION;  Surgeon: Jules Husbands, MD;  Location: [facility] ORS;  Service: General;  Laterality: N/A;  . TEE WITHOUT CARDIOVERSION N/A 06/22/2017   Procedure: TRANSESOPHAGEAL ECHOCARDIOGRAM (TEE);  Surgeon: Wellington Hampshire, MD;  Location: [facility] ORS;  Service: Cardiovascular;  Laterality: N/A;  . VAGINAL HYSTERECTOMY    . VISCERAL ARTERY INTERVENTION N/A 06/20/2017   Procedure: Visceral Artery Intervention, possible aortic thrombectomy;  Surgeon: Algernon Huxley, MD;  Location: [facility];  Service: Cardiovascular;  Laterality: N/A;   Social History Social History   Tobacco Use  . Smoking status:  Current Every Day Smoker    Packs/day: 0.50    Types: Cigarettes  . Smokeless tobacco: Never Used  Substance Use Topics  . Alcohol use: No  . Drug use: No   Family History Family History  Problem Relation Age of Onset  . Hypertension Mother   . Heart disease Mother   . Heart attack Mother   . Breast cancer Mother   . Hypertension Father   . Breast cancer Paternal Aunt   The patient denies any peripheral artery disease, renal disease or bleeding/clotting disorder.  No Known Allergies  REVIEW OF SYSTEMS (Negative unless checked)  Constitutional: [] Weight loss  [] Fever  [] Chills Cardiac: [] Chest pain   [] Chest pressure   [] Palpitations   [] Shortness of breath when laying flat   [] Shortness of breath at rest   [] Shortness of breath with exertion. Vascular:  [] Pain in legs with walking   [] Pain in legs at rest   [] Pain in legs when laying flat   [] Claudication   [] Pain in feet when walking  [] Pain in feet at rest  [] Pain in feet when laying flat   [] History of DVT   [] Phlebitis   [] Swelling in legs    [] Varicose veins   [] Non-healing ulcers Pulmonary:   [] Uses home oxygen   [] Productive cough   [] Hemoptysis   [] Wheeze  [] COPD   [] Asthma Neurologic:  [] Dizziness  [] Blackouts   [] Seizures   [] History of stroke   [] History of TIA  [] Aphasia   [] Temporary blindness   [] Dysphagia   [] Weakness or numbness in arms   [] Weakness or numbness in legs Musculoskeletal:  [] Arthritis   [] Joint swelling   [] Joint pain   [] Low back pain Hematologic:  [] Easy bruising  [] Easy bleeding   [] Hypercoagulable state   [] Anemic  [] Hepatitis Gastrointestinal:  [] Blood in stool   [] Vomiting blood  [x] Gastroesophageal reflux/heartburn   [] Difficulty swallowing. Genitourinary:  [] Chronic kidney disease   [] Difficult urination  [] Frequent urination  [] Burning with urination   [] Blood in urine Skin:  [] Rashes   [] Ulcers   [] Wounds Psychological:  [x] History of anxiety   [x]  History of major depression.  Positive for abdominal pain.  Physical Examination  Vitals:   01/24/18 2215 01/24/18 2259 01/25/18 0520 01/25/18 1017  BP:  (!) 166/101 98/66 (!) 90/57  Pulse: 81 88 67 62  Resp:  20 20   Temp:  97.6 F (36.4 C) 97.7 F (36.5 C)   TempSrc:  Oral Oral   SpO2: 96% 99% 97%   Weight:      Height:       Body mass index is 38.09 kg/m. Gen:  WD/WN, NAD Head: [place]/AT, No temporalis wasting. Prominent temp pulse not noted. Ear/Nose/Throat: Hearing grossly intact, nares w/o erythema or drainage, oropharynx w/o Erythema/Exudate Eyes: Sclera non-icteric, conjunctiva clear Neck: Trachea midline.  No JVD.  Pulmonary:  Good air movement, respirations not labored, equal bilaterally.  Cardiac: RRR, normal S1, S2. Vascular:  Vessel Right Left  Radial Palpable Palpable  Ulnar Palpable Palpable  Brachial Palpable Palpable  Carotid Palpable, without bruit Palpable, without bruit  Aorta Not palpable N/A  Femoral Palpable Palpable  Popliteal Palpable Palpable  PT Palpable Palpable  DP Palpable Palpable   Gastrointestinal:  soft, non-tender/non-distended. No guarding/reflex.  Musculoskeletal: M/S 5/5 throughout.  Extremities without ischemic changes.  No deformity or atrophy.  Mild edema. Neurologic: Sensation grossly intact in extremities.  Symmetrical.  Speech is fluent. Motor exam as listed above. Psychiatric: Judgment intact, Mood & affect appropriate for pt's  clinical situation. Dermatologic: No rashes or ulcers noted.  No cellulitis or open wounds. Lymph : No Cervical, Axillary, or Inguinal lymphadenopathy.  CBC Lab Results  Component Value Date   WBC 7.1 01/25/2018   HGB 10.9 (L) 01/25/2018   HCT 34.3 (L) 01/25/2018   MCV 82.1 01/25/2018   PLT 285 01/25/2018   BMET    Component Value Date/Time   NA 140 01/25/2018 0325   NA 140 02/12/2015 1137   K 3.7 01/25/2018 0325   K 2.9 (L) 02/12/2015 1137   CL 107 01/25/2018 0325   CL 107 02/12/2015 1137   CO2 24 01/25/2018 0325   CO2 27 02/12/2015 1137   GLUCOSE 101 (H) 01/25/2018 0325   GLUCOSE 120 (H) 02/12/2015 1137   BUN 7 01/25/2018 0325   BUN 15 02/12/2015 1137   CREATININE 0.95 01/25/2018 0325   CREATININE 0.78 02/12/2015 1137   CALCIUM 8.5 (L) 01/25/2018 0325   CALCIUM 9.0 02/12/2015 1137   GFRNONAA >60 01/25/2018 0325   GFRNONAA >60 02/12/2015 1137   GFRAA >60 01/25/2018 0325   GFRAA >60 02/12/2015 1137   Estimated Creatinine Clearance: 71.7 mL/min (by C-G formula based on SCr of 0.95 mg/dL).  COAG Lab Results  Component Value Date   INR 0.95 01/24/2018   INR 1.53 06/27/2017   INR 1.17 06/26/2017   Radiology Ct Angio Abd/pel W And/or Wo Contrast  Result Date: 01/24/2018 CLINICAL DATA:  57 y/o F; abdominal pain and blood in the stool. History of bowel resection and abdominal aortic aneurysm. EXAM: CTA ABDOMEN AND PELVIS wITHOUT AND WITH CONTRAST TECHNIQUE: Multidetector CT imaging of the abdomen and pelvis was performed using the standard protocol during bolus administration of intravenous contrast. Multiplanar reconstructed images  and MIPs were obtained and reviewed to evaluate the vascular anatomy. CONTRAST:  147mL ISOVUE-370 IOPAMIDOL (ISOVUE-370) INJECTION 76% COMPARISON:  08/15/2017 CT abdomen and pelvis. FINDINGS: VASCULAR Aorta: Normal caliber aorta without aneurysm, dissection, vasculitis or significant stenosis. Extensive mixed plaque. Celiac: Patent without evidence of aneurysm, dissection, vasculitis or significant stenosis. SMA: Stent within the SMA origin which appears occluded. The SMA downstream to the stent normally enhances which may be due to collateral circulation. Renals: Both renal arteries are patent without evidence of aneurysm, dissection, vasculitis, fibromuscular dysplasia or significant stenosis. IMA: Patent without evidence of aneurysm, dissection, vasculitis or significant stenosis. Inflow: Patent without evidence of aneurysm, dissection, vasculitis or significant stenosis. Proximal Outflow: Bilateral common femoral and visualized portions of the superficial and profunda femoral arteries are patent without evidence of aneurysm, dissection, vasculitis or significant stenosis. Veins: No obvious venous abnormality within the limitations of this arterial phase study. Review of the MIP images confirms the above findings. NON-VASCULAR Lower chest: No acute abnormality. Hepatobiliary: No focal liver abnormality is seen. Status post cholecystectomy. No biliary dilatation. Pancreas: Unremarkable. No pancreatic ductal dilatation or surrounding inflammatory changes. Spleen: Normal in size without focal abnormality. Adrenals/Urinary Tract: Stable left adrenal hypertrophy. No focal kidney lesion. No hydronephrosis. No urinary stone disease. Normal bladder. Stomach/Bowel: Stomach is within normal limits. Appendix not identified. Partial small-bowel resection with patent right lower quadrant anastomosis. No evidence of bowel wall thickening, distention, or inflammatory changes. Lymphatic: No enlarged abdominal or pelvic lymph  nodes. Reproductive: Status post hysterectomy. No adnexal masses. Other: Postsurgical changes within the lower anterior abdominal wall with diastasis rectus and a broad-based hernia containing loops of small and large bowel, no associated bowel obstruction or edema. Musculoskeletal: No fracture is seen. IMPRESSION: VASCULAR 1. SMA origin stent  appears occluded. The SMA downstream to the stent normally enhances, probably due to collateral circulation. 2. No dissection or aneurysm. Extensive abdominal aortic atherosclerosis. NON-VASCULAR Postsurgical changes in the abdominal wall with a broad-based hernia and partial small bowel resection with patent anastomosis. No acute process identified. Electronically Signed   By: Kristine Garbe M.D.   On: 01/24/2018 20:10   Assessment/Plan The patient is a 57 year old female with a past medical history of hypertension, hyperlipidemia, GERD, prediabetic, peripheral artery disease, chronic tobacco abuse well-known to our clinic and has been treated in the past for superior mesenteric artery thrombosis/stenosis.  Patient presented to the [facility] ED yesterday complaining of a week's worth of progressively worsening abdominal pain.  CTA of the abdomen and pelvis was notable for possible SMA stent occlusion - stable 1.  Mesenteric artery stenosis: Patient has been treated in the past for mesenteric artery stenosis/thrombosis.  Patient's last intervention was September/October 2018.  The patient has not followed up in our office and she states "she does not have money for doctors".  Today, the patient denies any postprandial pain, nausea or vomiting.  Patient denies any fever.  The patient is experiencing intermittent abdominal pain and intermittent nonbloody diarrhea.  The patient's abdominal pain has not worsened.  The patient was crying during our examination as she was hungry and would like to eat.  Normal white count.  Lactic acid is not  elevated.  Kidney function is normal.  There is reconstitution of the SMA downstream due to collateral circulation.  There is no indication for emergent intervention at this time.  Will plan on a mesenteric angiogram with Dr. Lucky Cowboy on Monday.  Procedure, risks and benefits spine to the patient and her two sons are present. All questions were answered.  Patient wishes to proceed. 2. Tobacco Abuse: We had a discussion for approximately 10 minutes regarding the absolute need for smoking cessation due to the deleterious nature of tobacco on the vascular system. We discussed the tobacco use would diminish patency of any intervention, and likely significantly worsen progressio of disease. We discussed multiple agents for quitting including replacement therapy or medications to reduce cravings such as Chantix. The patient voices their understanding of the importance of smoking cessation. 3. Hyperlipidemia: Encouraged good control as its slows the progression of atherosclerotic disease 4. Hypertension: Encouraged good control as its slows the progression of atherosclerotic disease 5. Pre-diabetic: Encouraged good control as its slows the progression of atherosclerotic disease  Discussed with Dr. Mayme Genta, PA-C  01/25/2018 12:55 PM  This note was created with Dragon medical transcription system.  Any error is purely unintentional.

## 2018-01-26 ENCOUNTER — Inpatient Hospital Stay: Payer: Medicare Other

## 2018-01-26 LAB — CBC
HCT: 33.6 % — ABNORMAL LOW (ref 35.0–47.0)
Hemoglobin: 10.5 g/dL — ABNORMAL LOW (ref 12.0–16.0)
MCH: 25.8 pg — ABNORMAL LOW (ref 26.0–34.0)
MCHC: 31.4 g/dL — ABNORMAL LOW (ref 32.0–36.0)
MCV: 82.3 fL (ref 80.0–100.0)
Platelets: 215 10*3/uL (ref 150–440)
RBC: 4.08 MIL/uL (ref 3.80–5.20)
RDW: 17.5 % — ABNORMAL HIGH (ref 11.5–14.5)
WBC: 4.6 10*3/uL (ref 3.6–11.0)

## 2018-01-26 LAB — HEPARIN LEVEL (UNFRACTIONATED): Heparin Unfractionated: 0.32 IU/mL (ref 0.30–0.70)

## 2018-01-26 MED ORDER — LACTULOSE 10 GM/15ML PO SOLN: 30.0000 g | Freq: Two times a day (BID) | ORAL | Status: DC | PRN

## 2018-01-26 NOTE — Progress Notes (Signed)
[facility] [place] NAME: Carla [name]    MR#:  [id number]  DATE OF BIRTH:  [birth year]  SUBJECTIVE:   Patient here due to abdominal pain and noted to have mesenteric ischemia.  Patient is on a heparin drip.  Continues to have some abdominal pain and complaining of some distention.  REVIEW OF SYSTEMS:    Review of Systems  Constitutional: Negative for chills and fever.  HENT: Negative for congestion and tinnitus.   Eyes: Negative for blurred vision and double vision.  Respiratory: Negative for cough, shortness of breath and wheezing.   Cardiovascular: Negative for chest pain, orthopnea and PND.  Gastrointestinal: Positive for abdominal pain. Negative for diarrhea, nausea and vomiting.  Genitourinary: Negative for dysuria and hematuria.  Neurological: Negative for dizziness, sensory change and focal weakness.  All other systems reviewed and are negative.   Nutrition: heart Healthy/Carb control Tolerating Diet: yes Tolerating PT: Await Eval.   DRUG ALLERGIES:  No Known Allergies  VITALS:  Blood pressure 118/81, pulse 69, temperature 98 F (36.7 C), temperature source Oral, resp. rate 20, height 5\' 3"  (1.6 m), weight 97.5 kg (215 lb), SpO2 100 %.  PHYSICAL EXAMINATION:   Physical Exam  GENERAL:  57 y.o.-year-old patient lying in bed in no acute distress.  EYES: Pupils equal, round, reactive to light and accommodation. No scleral icterus. Extraocular muscles intact.  HEENT: Head atraumatic, normocephalic. Oropharynx and nasopharynx clear.  NECK:  Supple, no jugular venous distention. No thyroid enlargement, no tenderness.  LUNGS: Normal breath sounds bilaterally, no wheezing, rales, rhonchi. No use of accessory muscles of respiration.  CARDIOVASCULAR: S1, S2 normal. No murmurs, rubs, or gallops.  ABDOMEN: Soft, nontender, nondistended. Bowel sounds present. No organomegaly or mass.  EXTREMITIES: No cyanosis, clubbing, +1-2 edema b/l     NEUROLOGIC: Cranial nerves II through XII are intact. No focal Motor or sensory deficits b/l.   PSYCHIATRIC: The patient is alert and oriented x 3.  SKIN: No obvious rash, lesion, or ulcer.    LABORATORY PANEL:   CBC Recent Labs  Lab 01/26/18 0600  WBC 4.6  HGB 10.5*  HCT 33.6*  PLT 215   ------------------------------------------------------------------------------------------------------------------  Chemistries  Recent Labs  Lab 01/24/18 1808 01/25/18 0325  NA 138 140  K 3.4* 3.7  CL 107 107  CO2 22 24  GLUCOSE 109* 101*  BUN 9 7  CREATININE 0.98 0.95  CALCIUM 8.9 8.5*  AST 17  --   ALT 12*  --   ALKPHOS 91  --   BILITOT 0.4  --    ------------------------------------------------------------------------------------------------------------------  Cardiac Enzymes No results for input(s): TROPONINI in the last 168 hours. ------------------------------------------------------------------------------------------------------------------  RADIOLOGY:  Dg Abd 1 View  Result Date: 01/26/2018 CLINICAL DATA:  Patient presented 2 days ago with severe abdominal pain related to SMA stent occlusion. She now has abdominal distention. EXAM: ABDOMEN - 1 VIEW COMPARISON:  None. FINDINGS: Bowel gas pattern unremarkable without evidence of obstruction or significant ileus. Moderate stool burden in the colon. SMA stent. Tubal ligation clip in the RIGHT side of the pelvis. Prior cholecystectomy. Mild degenerative changes involving the LOWER lumbar spine. IMPRESSION: No acute abdominal abnormality.  Moderate colonic stool burden. Electronically Signed   By: Evangeline Dakin M.D.   On: 01/26/2018 13:06   Ct Angio Abd/pel W And/or Wo Contrast  Result Date: 01/24/2018 CLINICAL DATA:  57 y/o F; abdominal pain and blood in the stool. History of bowel resection and abdominal aortic aneurysm.  EXAM: CTA ABDOMEN AND PELVIS wITHOUT AND WITH CONTRAST TECHNIQUE: Multidetector CT imaging of the abdomen  and pelvis was performed using the standard protocol during bolus administration of intravenous contrast. Multiplanar reconstructed images and MIPs were obtained and reviewed to evaluate the vascular anatomy. CONTRAST:  126mL ISOVUE-370 IOPAMIDOL (ISOVUE-370) INJECTION 76% COMPARISON:  08/15/2017 CT abdomen and pelvis. FINDINGS: VASCULAR Aorta: Normal caliber aorta without aneurysm, dissection, vasculitis or significant stenosis. Extensive mixed plaque. Celiac: Patent without evidence of aneurysm, dissection, vasculitis or significant stenosis. SMA: Stent within the SMA origin which appears occluded. The SMA downstream to the stent normally enhances which may be due to collateral circulation. Renals: Both renal arteries are patent without evidence of aneurysm, dissection, vasculitis, fibromuscular dysplasia or significant stenosis. IMA: Patent without evidence of aneurysm, dissection, vasculitis or significant stenosis. Inflow: Patent without evidence of aneurysm, dissection, vasculitis or significant stenosis. Proximal Outflow: Bilateral common femoral and visualized portions of the superficial and profunda femoral arteries are patent without evidence of aneurysm, dissection, vasculitis or significant stenosis. Veins: No obvious venous abnormality within the limitations of this arterial phase study. Review of the MIP images confirms the above findings. NON-VASCULAR Lower chest: No acute abnormality. Hepatobiliary: No focal liver abnormality is seen. Status post cholecystectomy. No biliary dilatation. Pancreas: Unremarkable. No pancreatic ductal dilatation or surrounding inflammatory changes. Spleen: Normal in size without focal abnormality. Adrenals/Urinary Tract: Stable left adrenal hypertrophy. No focal kidney lesion. No hydronephrosis. No urinary stone disease. Normal bladder. Stomach/Bowel: Stomach is within normal limits. Appendix not identified. Partial small-bowel resection with patent right lower quadrant  anastomosis. No evidence of bowel wall thickening, distention, or inflammatory changes. Lymphatic: No enlarged abdominal or pelvic lymph nodes. Reproductive: Status post hysterectomy. No adnexal masses. Other: Postsurgical changes within the lower anterior abdominal wall with diastasis rectus and a broad-based hernia containing loops of small and large bowel, no associated bowel obstruction or edema. Musculoskeletal: No fracture is seen. IMPRESSION: VASCULAR 1. SMA origin stent appears occluded. The SMA downstream to the stent normally enhances, probably due to collateral circulation. 2. No dissection or aneurysm. Extensive abdominal aortic atherosclerosis. NON-VASCULAR Postsurgical changes in the abdominal wall with a broad-based hernia and partial small bowel resection with patent anastomosis. No acute process identified. Electronically Signed   By: Kristine Garbe M.D.   On: 01/24/2018 20:10     ASSESSMENT AND PLAN:   57 year old female with past medical history of hypertension, hyperlipidemia, peripheral vascular disease, osteoarthritis, obesity, GERD, previous history of superior mesenteric thrombosis who presents to the hospital due to abdominal pain and noted to have mesenteric ischemia.  1.  Mesenteric ischemia-patient presented to the hospital due to worsening abdominal pain and CT scan findings were suggestive of SMA stent restenosis.  Patient has a previous history of similar issues and her stent looks like it to be occluded. -Continue supportive care with pain control, continue heparin drip.  Seen by vascular surgery and plan for angiogram on Monday.  2.  Essential hypertension-continue Metoprolol, lisinopril.  3.  Hyperlipidemia-continue atorvastatin.  4.  Anxiety-continue Xanax.  5.  Neuropathy-continue gabapentin.  6.  Constipation -she complains of some abdominal distention today.  KUB is consistent with moderate stool burden.  Continue Senokot, will add some lactulose as  needed and follow.  7.  Tobacco abuse-continue nicotine patch.     All the records are reviewed and case discussed with Care Management/Social Worker. Management plans discussed with the patient, family and they are in agreement.  CODE STATUS: Full code  DVT Prophylaxis:  Heparin drip  TOTAL TIME TAKING CARE OF THIS PATIENT: 30 minutes.   POSSIBLE D/C IN 2-3 DAYS, DEPENDING ON CLINICAL CONDITION.   Henreitta Leber M.D on 01/26/2018 at 2:39 PM  Between 7am to 6pm - Pager - [phone]  After 6pm go to [email or web address] - Proofreader  [facility]  Office  [phone]  CC: Primary care physician; Irean Hong, MD

## 2018-01-26 NOTE — Progress Notes (Signed)
ANTICOAGULATION CONSULT NOTE  Pharmacy Consult for Heparin  Indication: occluded SMA stent  No Known Allergies  Patient Measurements: Height: 5\' 3"  (160 cm) Weight: 215 lb (97.5 kg) IBW/kg (Calculated) : 52.4 Heparin Dosing Weight: 75.1 kg   Vital Signs: Temp: 98.2 F (36.8 C) (04/06 0614) Temp Source: Oral (04/06 0614) BP: 109/55 (04/06 0614) Pulse Rate: 58 (04/06 0614)  Labs: Recent Labs    01/24/18 1808  01/24/18 2109 01/25/18 0325 01/25/18 1429 01/25/18 2129 01/26/18 0600  HGB 12.5  --   --  10.9*  --   --  10.5*  HCT 39.4  --   --  34.3*  --   --  33.6*  PLT 309  --   --  285  --   --  215  APTT  --   --  28  --   --   --   --   LABPROT  --   --  12.6  --   --   --   --   INR  --   --  0.95  --   --   --   --   HEPARINUNFRC  --    < > <0.10* 0.96* 0.77* 0.40 0.32  CREATININE 0.98  --   --  0.95  --   --   --    < > = values in this interval not displayed.    Estimated Creatinine Clearance: 71.7 mL/min (by C-G formula based on SCr of 0.95 mg/dL).   Medical History: Past Medical History:  Diagnosis Date  . Abdominal aortic atherosclerosis ([place])    a. 05/2017 CTA abd/pelvis: significant atherosclerotic dzs of the infrarenal abd Ao w/ some mural thrombus. No aneurysm or dissection.  . Abdominal pain   . Acute focal ischemia of small intestine ([facility])   . Acute right-sided low back pain with right-sided sciatica 06/13/2017  . AKI (acute kidney injury) ([place]) 07/29/2017  . Baker's cyst of knee, right    a. 07/2016 U/S: 4.1 x 1.4 x 2.9 cystic structure in R poplitetal fossa.  . Bell's palsy   . Cancer ([place])   . Chest pain    a. 08/2012 Lexiscan MV: EF 54%, non ischemia/infarct.  . Chronic idiopathic constipation   . Chronic mesenteric ischemia ([place])   . Diastolic dysfunction    a. 05/2017 Echo: Ef 60-65%, no rwma, Gr1 DD, no source of cardiac emboli.  . 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 05/2017 CTA Abd/pelvis: apparent thrombus or embolus in prox  SMA (70-90%); b. 05/2017 catheter directed tPA, mechanical thrombectomy, and stenting of the SMA.  . Essential hypertension with goal blood pressure less than 140/90 01/19/2016  . Gastroesophageal reflux disease 01/19/2016  . GERD (gastroesophageal reflux disease)   . Hyperlipidemia   . Hypertension   . Hypotension 07/29/2017  . Morbid obesity with BMI of 40.0-44.9, adult ([place])   . Occlusive mesenteric ischemia ([place]) 06/19/2017  . Personal history of other malignant neoplasm of skin 06/01/2016  . Primary osteoarthritis of both knees 06/13/2017  . Recurrent major depressive disorder ([place]) 01/19/2016  . SBO (small bowel obstruction) ([place]) 08/06/2017  . 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 Urinary tract infection 01/09/2014    Medications:  Facility-Administered Medications Prior to Admission  Medication Dose Route Frequency Provider Last Rate Last Dose  . [DISCONTINUED] amoxicillin-clavulanate (AUGMENTIN) 875-125 MG per tablet 1 tablet  1 tablet Oral Q12H Olean Ree, MD  Medications Prior to Admission  Medication Sig Dispense Refill Last Dose  . acetaminophen (TYLENOL) 325 MG tablet Take 2 tablets (650 mg total) by mouth every 6 (six) hours as needed for mild pain (or Fever >/= 101).   prn at prn  . apixaban (ELIQUIS) 5 MG TABS tablet Take 1 tablet (5 mg total) by mouth 2 (two) times daily. 60 tablet 5 01/23/2018 at Unknown time  . aspirin EC 81 MG tablet Take 81 mg by mouth daily.    01/23/2018 at Unknown time  . atorvastatin (LIPITOR) 80 MG tablet Take 80 mg by mouth at bedtime.   01/23/2018 at Unknown time  . gabapentin (NEURONTIN) 300 MG capsule Take 300 mg by mouth 3 (three) times daily.    01/23/2018 at Unknown time  . ibuprofen (ADVIL,MOTRIN) 600 MG tablet Take 1 tablet (600 mg total) by mouth every 8 (eight) hours as needed for mild pain or moderate pain. 30 tablet 0 prn at prn  . lisinopril (PRINIVIL,ZESTRIL) 40 MG tablet Take 40 mg by mouth daily.   01/23/2018 at Unknown time   . metoprolol tartrate (LOPRESSOR) 50 MG tablet Take 50 mg by mouth 2 (two) times daily.   01/23/2018 at Unknown time  . omeprazole (PRILOSEC) 20 MG capsule Take 20 mg by mouth daily.    01/23/2018 at Unknown time  . ondansetron (ZOFRAN) 4 MG tablet Take 1 tablet (4 mg total) by mouth every 6 (six) hours as needed for nausea. 20 tablet 0 prn at prn  . senna-docusate (SENOKOT-S) 8.6-50 MG tablet Take 2 tablets by mouth at bedtime as needed for mild constipation. (Patient taking differently: Take 2 tablets by mouth at bedtime. )   01/23/2018 at Unknown time    Assessment: Pharmacy consulted to dose heparin in this 57 year old female admitted with occluded SMA stent.  Pt was on eliquis at home, pt states last dose was 4/3 AM.  CrCl = 69.5 ml/min  Goal of Therapy:  Heparin level 0.3-0.7 units/ml Monitor platelets by anticoagulation protocol: Yes   Plan:  Give 4500 units bolus x 1 Start heparin infusion at 1300 units/hr Check anti-Xa level in 6 hours and daily while on heparin Continue to monitor H&H and platelets  Will monitor aPTT if baseline HL is elevated.   HL = 0.77, supratherapeutic. I will decrease the rate to 1050 units/hr and obtain a follow up level in 6 hours. Pharmacy will continue to follow and adjust.    Lendon Ka, PharmD Pharmacy Resident 01/26/2018,6:48 AM    4/5 PM heparin level 0.40. Continue current regimen. Recheck heparin level and CBC with tomorrow AM labs.  4/6 AM heparin level 0.32. Continue current regimen. Recheck heparin level and CBC with tomorrow AM labs.  Sim Boast, PharmD, BCPS  01/26/18 6:48 AM

## 2018-01-27 LAB — GASTROINTESTINAL PANEL BY PCR, STOOL (REPLACES STOOL CULTURE)

## 2018-01-27 LAB — C DIFFICILE QUICK SCREEN W PCR REFLEX
C Diff antigen: NEGATIVE
C Diff interpretation: NOT DETECTED
C Diff toxin: NEGATIVE

## 2018-01-27 LAB — CBC
HCT: 31.8 % — ABNORMAL LOW (ref 35.0–47.0)
Hemoglobin: 10.6 g/dL — ABNORMAL LOW (ref 12.0–16.0)
MCH: 27.3 pg (ref 26.0–34.0)
MCHC: 33.3 g/dL (ref 32.0–36.0)
MCV: 82.1 fL (ref 80.0–100.0)
Platelets: 193 10*3/uL (ref 150–440)
RBC: 3.87 MIL/uL (ref 3.80–5.20)
RDW: 17.3 % — ABNORMAL HIGH (ref 11.5–14.5)
WBC: 5.3 10*3/uL (ref 3.6–11.0)

## 2018-01-27 LAB — HEPARIN LEVEL (UNFRACTIONATED): Heparin Unfractionated: 0.32 IU/mL (ref 0.30–0.70)

## 2018-01-27 LAB — MRSA PCR SCREENING: MRSA by PCR: NEGATIVE

## 2018-01-27 MED ORDER — CEFAZOLIN SODIUM-DEXTROSE 2-4 GM/100ML-% IV SOLN
2.0000 g | INTRAVENOUS | Status: DC
  Filled 2018-01-27: qty 100

## 2018-01-27 MED ORDER — LOPERAMIDE HCL 2 MG PO CAPS
2.0000 mg | ORAL_CAPSULE | Freq: Three times a day (TID) | ORAL | Status: DC | PRN
  Administered 2018-01-28 – 2018-01-30 (×2): 2 mg via ORAL
  Filled 2018-01-27 (×2): qty 1

## 2018-01-27 MED ORDER — SODIUM CHLORIDE 0.9 % IV SOLN
INTRAVENOUS | Status: DC
  Administered 2018-01-28 – 2018-01-29 (×3): via INTRAVENOUS

## 2018-01-27 NOTE — Progress Notes (Signed)
[facility] [place] NAME: Carla [name]    MR#:  [id number]  DATE OF BIRTH:  1961-03-01  SUBJECTIVE:   Patient having some diarrhea this morning.  Still having some vague abdominal pain.  Plan for mesenteric angiogram tomorrow.  REVIEW OF SYSTEMS:    Review of Systems  Constitutional: Negative for chills and fever.  HENT: Negative for congestion and tinnitus.   Eyes: Negative for blurred vision and double vision.  Respiratory: Negative for cough, shortness of breath and wheezing.   Cardiovascular: Negative for chest pain, orthopnea and PND.  Gastrointestinal: Positive for abdominal pain. Negative for diarrhea, nausea and vomiting.  Genitourinary: Negative for dysuria and hematuria.  Neurological: Negative for dizziness, sensory change and focal weakness.  All other systems reviewed and are negative.   Nutrition: heart Healthy/Carb control Tolerating Diet: yes Tolerating PT: Ambulatory  DRUG ALLERGIES:  No Known Allergies  VITALS:  Blood pressure (!) 95/56, pulse (!) 52, temperature 97.9 F (36.6 C), temperature source Oral, resp. rate 16, height 5\' 3"  (1.6 m), weight 97.5 kg (215 lb), SpO2 98 %.  PHYSICAL EXAMINATION:   Physical Exam  GENERAL:  57 y.o.-year-old patient lying in bed in no acute distress.  EYES: Pupils equal, round, reactive to light and accommodation. No scleral icterus. Extraocular muscles intact.  HEENT: Head atraumatic, normocephalic. Oropharynx and nasopharynx clear.  NECK:  Supple, no jugular venous distention. No thyroid enlargement, no tenderness.  LUNGS: Normal breath sounds bilaterally, no wheezing, rales, rhonchi. No use of accessory muscles of respiration.  CARDIOVASCULAR: S1, S2 normal. No murmurs, rubs, or gallops.  ABDOMEN: Soft, nontender, nondistended. Bowel sounds present. No organomegaly or mass.  EXTREMITIES: No cyanosis, clubbing, +1-2 edema b/l    NEUROLOGIC: Cranial nerves II through XII are  intact. No focal Motor or sensory deficits b/l.   PSYCHIATRIC: The patient is alert and oriented x 3.  SKIN: No obvious rash, lesion, or ulcer.    LABORATORY PANEL:   CBC Recent Labs  Lab 01/27/18 0523  WBC 5.3  HGB 10.6*  HCT 31.8*  PLT 193   ------------------------------------------------------------------------------------------------------------------  Chemistries  Recent Labs  Lab 01/24/18 1808 01/25/18 0325  NA 138 140  K 3.4* 3.7  CL 107 107  CO2 22 24  GLUCOSE 109* 101*  BUN 9 7  CREATININE 0.98 0.95  CALCIUM 8.9 8.5*  AST 17  --   ALT 12*  --   ALKPHOS 91  --   BILITOT 0.4  --    ------------------------------------------------------------------------------------------------------------------  Cardiac Enzymes No results for input(s): TROPONINI in the last 168 hours. ------------------------------------------------------------------------------------------------------------------  RADIOLOGY:  Dg Abd 1 View  Result Date: 01/26/2018 CLINICAL DATA:  Patient presented 2 days ago with severe abdominal pain related to SMA stent occlusion. She now has abdominal distention. EXAM: ABDOMEN - 1 VIEW COMPARISON:  None. FINDINGS: Bowel gas pattern unremarkable without evidence of obstruction or significant ileus. Moderate stool burden in the colon. SMA stent. Tubal ligation clip in the RIGHT side of the pelvis. Prior cholecystectomy. Mild degenerative changes involving the LOWER lumbar spine. IMPRESSION: No acute abdominal abnormality.  Moderate colonic stool burden. Electronically Signed   By: Evangeline Dakin M.D.   On: 01/26/2018 13:06     ASSESSMENT AND PLAN:   57 year old female with past medical history of hypertension, hyperlipidemia, peripheral vascular disease, osteoarthritis, obesity, GERD, previous history of superior mesenteric thrombosis who presents to the hospital due to abdominal pain and noted to have mesenteric ischemia.  1.  Mesenteric  ischemia-patient presented to the hospital due to worsening abdominal pain and CT scan findings were suggestive of SMA stent restenosis.   -Continue supportive care with pain control, continue heparin drip.  Seen by vascular surgery and plan for angiogram on tomorrow.  2.  Essential hypertension-continue Metoprolol, lisinopril.  3.  Hyperlipidemia-continue atorvastatin.  4.  Anxiety-continue Xanax.  5.  Neuropathy-continue gabapentin.  6. Diarrhea - pt. Having some loose stools today. X-ray yesterday showed constipation but patient not given any laxatives.  C. difficile is negative.  Continue supportive care with Imodium and, hold laxatives.  7.  Tobacco abuse-continue nicotine patch.   All the records are reviewed and case discussed with Care Management/Social Worker. Management plans discussed with the patient, family and they are in agreement.  CODE STATUS: Full code  DVT Prophylaxis: Heparin drip  TOTAL TIME TAKING CARE OF THIS PATIENT: 30 minutes.   POSSIBLE D/C IN 2-3 DAYS, DEPENDING ON CLINICAL CONDITION.   Henreitta Leber M.D on 01/27/2018 at 2:56 PM  Between 7am to 6pm - Pager - [phone]  After 6pm go to [email or web address] - Proofreader  [facility]  Office  [phone]  CC: Primary care physician; Irean Hong, MD

## 2018-01-27 NOTE — Progress Notes (Signed)
[facility]  Daily Progress Note   Subjective: Patient seen and examined with family at bedside.  Patient experiencing multiple loose stools this a.m.  Medicine is ruling out C. difficile.  Patient with one episode of nausea after eating this a.m.  Had another long discussion with the patient and her family about the pathophysiology of atherosclerotic disease and his risk factors.  Patient is concerned with her lack of insurance and being able to pay for follow-up appointments and discharge medications.  The patient is currently applying for disability and her lawyer told her not to apply for Medicaid/Medicare at this time.  Discussed the patient's mesenteric angiogram.  Objective: Vitals:   01/26/18 1426 01/26/18 2050 01/27/18 0551 01/27/18 1015  BP: 118/81 (!) 151/58 (!) 142/62 122/85  Pulse: 69 79 64 60  Resp: 20 18 18    Temp: 98 F (36.7 C) 98.7 F (37.1 C) 98.5 F (36.9 C)   TempSrc: Oral Oral Oral   SpO2: 100% 100% 96%   Weight:      Height:        Intake/Output Summary (Last 24 hours) at 01/27/2018 1358 Last data filed at 01/27/2018 0900 Gross per 24 hour  Intake 240 ml  Output 0 ml  Net 240 ml   Physical Exam: A&Ox3, NAD CV: Irregularly irregular Pulmonary: CTA Bilaterally Abdomen: Soft, Nontender, Nondistended, (+) bowel sounds Vascular: Bilateral lower extremity are warm, nontender with minimal edema.   Laboratory: CBC    Component Value Date/Time   WBC 5.3 01/27/2018 0523   HGB 10.6 (L) 01/27/2018 0523   HGB 13.4 02/12/2015 1137   HCT 31.8 (L) 01/27/2018 0523   HCT 40.9 02/12/2015 1137   PLT 193 01/27/2018 0523   PLT 272 02/12/2015 1137   BMET    Component Value Date/Time   NA 140 01/25/2018 0325   NA 140 02/12/2015 1137   K 3.7 01/25/2018 0325   K 2.9 (L) 02/12/2015 1137   CL 107 01/25/2018 0325   CL 107 02/12/2015 1137   CO2 24 01/25/2018 0325   CO2 27 02/12/2015 1137   GLUCOSE 101 (H) 01/25/2018 0325   GLUCOSE 120 (H)  02/12/2015 1137   BUN 7 01/25/2018 0325   BUN 15 02/12/2015 1137   CREATININE 0.95 01/25/2018 0325   CREATININE 0.78 02/12/2015 1137   CALCIUM 8.5 (L) 01/25/2018 0325   CALCIUM 9.0 02/12/2015 1137   GFRNONAA >60 01/25/2018 0325   GFRNONAA >60 02/12/2015 1137   GFRAA >60 01/25/2018 0325   GFRAA >60 02/12/2015 1137   Assessment/Planning: The patient is a 57 year old female with a past medical history of mesenteric ischemia admitted with an occluded SMA stent - stable 1) patient is on the schedule for a mesenteric angiogram with possible intervention with Dr. Lucky Cowboy tomorrow 2) continue heparin 3) medicine to rule out C. Difficile 4) we will consult social work to see if patient can receive assistance to help pay for her medications/follow-up appointments that will be needed 5) pain control  Marcelle Overlie PA-C 01/27/2018 1:58 PM

## 2018-01-27 NOTE — Progress Notes (Deleted)
Patient discharge teaching given, including activity, diet, follow-up appoints, and medications. Patient verbalized understanding of all discharge instructions. IV access was d/c'd. Vitals are stable. Skin is intact except as charted in most recent assessments. Pt refused to be escorted out, to be driven home by family.  Carla [name]  

## 2018-01-27 NOTE — Progress Notes (Signed)
ANTICOAGULATION CONSULT NOTE  Pharmacy Consult for Heparin  Indication: occluded SMA stent  No Known Allergies  Patient Measurements: Height: 5\' 3"  (160 cm) Weight: 215 lb (97.5 kg) IBW/kg (Calculated) : 52.4 Heparin Dosing Weight: 75.1 kg   Vital Signs: Temp: 98.5 F (36.9 C) (04/07 0551) Temp Source: Oral (04/07 0551) BP: 142/62 (04/07 0551) Pulse Rate: 64 (04/07 0551)  Labs: Recent Labs    01/24/18 1808  01/24/18 2109 01/25/18 0325  01/25/18 2129 01/26/18 0600 01/27/18 0523 01/27/18 0636  HGB 12.5  --   --  10.9*  --   --  10.5* 10.6*  --   HCT 39.4  --   --  34.3*  --   --  33.6* 31.8*  --   PLT 309  --   --  285  --   --  215 193  --   APTT  --   --  28  --   --   --   --   --   --   LABPROT  --   --  12.6  --   --   --   --   --   --   INR  --   --  0.95  --   --   --   --   --   --   HEPARINUNFRC  --    < > <0.10* 0.96*   < > 0.40 0.32  --  0.32  CREATININE 0.98  --   --  0.95  --   --   --   --   --    < > = values in this interval not displayed.    Estimated Creatinine Clearance: 71.7 mL/min (by C-G formula based on SCr of 0.95 mg/dL).   Medical History: Past Medical History:  Diagnosis Date  . Abdominal aortic atherosclerosis ([place])    a. 05/2017 CTA abd/pelvis: significant atherosclerotic dzs of the infrarenal abd Ao w/ some mural thrombus. No aneurysm or dissection.  . Abdominal pain   . Acute focal ischemia of small intestine (HCC)   . Acute right-sided low back pain with right-sided sciatica 06/13/2017  . AKI (acute kidney injury) ([place]) 07/29/2017  . Baker's cyst of knee, right    a. 07/2016 U/S: 4.1 x 1.4 x 2.9 cystic structure in R poplitetal fossa.  . Bell's palsy   . Cancer ([place])   . Chest pain    a. 08/2012 Lexiscan MV: EF 54%, non ischemia/infarct.  . Chronic idiopathic constipation   . Chronic mesenteric ischemia ([place])   . Diastolic dysfunction    a. 05/2017 Echo: Ef 60-65%, no rwma, Gr1 DD, no source of cardiac emboli.  . 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 05/2017 CTA Abd/pelvis: apparent thrombus or embolus in prox SMA (70-90%); b. 05/2017 catheter directed tPA, mechanical thrombectomy, and stenting of the SMA.  . Essential hypertension with goal blood pressure less than 140/90 01/19/2016  . Gastroesophageal reflux disease 01/19/2016  . GERD (gastroesophageal reflux disease)   . Hyperlipidemia   . Hypertension   . Hypotension 07/29/2017  . Morbid obesity with BMI of 40.0-44.9, adult ([place])   . Occlusive mesenteric ischemia ([place]) 06/19/2017  . Personal history of other malignant neoplasm of skin 06/01/2016  . Primary osteoarthritis of both knees 06/13/2017  . Recurrent major depressive disorder ([place]) 01/19/2016  . SBO (small bowel obstruction) ([place]) 08/06/2017  . 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 Urinary tract infection  01/09/2014    Medications:  Facility-Administered Medications Prior to Admission  Medication Dose Route Frequency Provider Last Rate Last Dose  . [DISCONTINUED] amoxicillin-clavulanate (AUGMENTIN) 875-125 MG per tablet 1 tablet  1 tablet Oral Q12H Piscoya, Jose, MD       Medications Prior to Admission  Medication Sig Dispense Refill Last Dose  . acetaminophen (TYLENOL) 325 MG tablet Take 2 tablets (650 mg total) by mouth every 6 (six) hours as needed for mild pain (or Fever >/= 101).   prn at prn  . apixaban (ELIQUIS) 5 MG TABS tablet Take 1 tablet (5 mg total) by mouth 2 (two) times daily. 60 tablet 5 01/23/2018 at Unknown time  . aspirin EC 81 MG tablet Take 81 mg by mouth daily.    01/23/2018 at Unknown time  . atorvastatin (LIPITOR) 80 MG tablet Take 80 mg by mouth at bedtime.   01/23/2018 at Unknown time  . gabapentin (NEURONTIN) 300 MG capsule Take 300 mg by mouth 3 (three) times daily.    01/23/2018 at Unknown time  . ibuprofen (ADVIL,MOTRIN) 600 MG tablet Take 1 tablet (600 mg total) by mouth every 8 (eight) hours as needed for mild pain or moderate pain. 30 tablet 0 prn at prn  .  lisinopril (PRINIVIL,ZESTRIL) 40 MG tablet Take 40 mg by mouth daily.   01/23/2018 at Unknown time  . metoprolol tartrate (LOPRESSOR) 50 MG tablet Take 50 mg by mouth 2 (two) times daily.   01/23/2018 at Unknown time  . omeprazole (PRILOSEC) 20 MG capsule Take 20 mg by mouth daily.    01/23/2018 at Unknown time  . ondansetron (ZOFRAN) 4 MG tablet Take 1 tablet (4 mg total) by mouth every 6 (six) hours as needed for nausea. 20 tablet 0 prn at prn  . senna-docusate (SENOKOT-S) 8.6-50 MG tablet Take 2 tablets by mouth at bedtime as needed for mild constipation. (Patient taking differently: Take 2 tablets by mouth at bedtime. )   01/23/2018 at Unknown time    Assessment: Pharmacy consulted to dose heparin in this 57 year old female admitted with occluded SMA stent.  Pt was on eliquis at home, pt states last dose was 4/3 AM.  CrCl = 69.5 ml/min  Goal of Therapy:  Heparin level 0.3-0.7 units/ml Monitor platelets by anticoagulation protocol: Yes   Plan:  Give 4500 units bolus x 1 Start heparin infusion at 1300 units/hr Check anti-Xa level in 6 hours and daily while on heparin Continue to monitor H&H and platelets  Will monitor aPTT if baseline HL is elevated.   HL = 0.77, supratherapeutic. I will decrease the rate to 1050 units/hr and obtain a follow up level in 6 hours. Pharmacy will continue to follow and adjust.    Lendon Ka, PharmD Pharmacy Resident 01/27/2018,7:00 AM    4/5 PM heparin level 0.40. Continue current regimen. Recheck heparin level and CBC with tomorrow AM labs.  4/6 AM heparin level 0.32. Continue current regimen. Recheck heparin level and CBC with tomorrow AM labs.  4/7 AM heparin level 0.32. Continue current regimen. Recheck heparin level and CBC with tomorrow AM labs.   Sim Boast, PharmD, BCPS  01/27/18 7:00 AM

## 2018-01-27 NOTE — Progress Notes (Signed)
Pt has had a total of 4 loose stools since 0900 01/27/2018, no laxatives or stool softeners have been given to pt in the last 48 hours. RN notified prime doctor, new orders to place pt on isolation to rule out CDiff. Pt was educated about isolation purposes and the need for nursing staff to collect her stool. RN will continue to monitor pt.   Irvine Glorioso [employer]

## 2018-01-28 ENCOUNTER — Encounter
Admission: EM | Disposition: A | Discharge status: Home / Self Care | Payer: Self-pay | Source: Home / Self Care | Attending: Specialist

## 2018-01-28 DIAGNOSIS — K558 Other vascular disorders of intestine: Secondary | ICD-10-CM

## 2018-01-28 HISTORY — PX: VISCERAL ARTERY INTERVENTION: CATH118277

## 2018-01-28 LAB — BASIC METABOLIC PANEL
Anion gap: 7 (ref 5–15)
BUN: 11 mg/dL (ref 6–20)
CO2: 22 mmol/L (ref 22–32)
Calcium: 8.6 mg/dL — ABNORMAL LOW (ref 8.9–10.3)
Chloride: 111 mmol/L (ref 101–111)
Creatinine, Ser: 1.03 mg/dL — ABNORMAL HIGH (ref 0.44–1.00)
GFR calc Af Amer: >60 mL/min (ref >60)
GFR calc non Af Amer: 59 mL/min — ABNORMAL LOW (ref >60)
Glucose, Bld: 112 mg/dL — ABNORMAL HIGH (ref 65–99)
Potassium: 3.9 mmol/L (ref 3.5–5.1)
Sodium: 140 mmol/L (ref 135–145)

## 2018-01-28 LAB — CBC
HCT: 33.2 % — ABNORMAL LOW (ref 35.0–47.0)
Hemoglobin: 10.8 g/dL — ABNORMAL LOW (ref 12.0–16.0)
MCH: 26.5 pg (ref 26.0–34.0)
MCHC: 32.4 g/dL (ref 32.0–36.0)
MCV: 81.7 fL (ref 80.0–100.0)
Platelets: 234 10*3/uL (ref 150–440)
RBC: 4.07 MIL/uL (ref 3.80–5.20)
RDW: 17.1 % — ABNORMAL HIGH (ref 11.5–14.5)
WBC: 5.5 10*3/uL (ref 3.6–11.0)

## 2018-01-28 LAB — MAGNESIUM: Magnesium: 2 mg/dL (ref 1.7–2.4)

## 2018-01-28 LAB — HEPARIN LEVEL (UNFRACTIONATED): Heparin Unfractionated: 0.25 IU/mL — ABNORMAL LOW (ref 0.30–0.70)

## 2018-01-28 SURGERY — VISCERAL ARTERY INTERVENTION
Anesthesia: Moderate Sedation

## 2018-01-28 MED ORDER — HEPARIN (PORCINE) IN NACL 2-0.9 UNIT/ML-% IJ SOLN
INTRAMUSCULAR | Status: AC
  Filled 2018-01-28: qty 1000

## 2018-01-28 MED ORDER — IOPAMIDOL (ISOVUE-300) INJECTION 61%
INTRAVENOUS | Status: DC | PRN
  Administered 2018-01-28: 75 mL via INTRA_ARTERIAL

## 2018-01-28 MED ORDER — ALTEPLASE 2 MG IJ SOLR
INTRAMUSCULAR | Status: AC
  Filled 2018-01-28: qty 4

## 2018-01-28 MED ORDER — ONDANSETRON HCL 4 MG/2ML IJ SOLN
INTRAMUSCULAR | Status: AC
  Filled 2018-01-28: qty 2

## 2018-01-28 MED ORDER — LIDOCAINE-EPINEPHRINE (PF) 1 %-1:200000 IJ SOLN
INTRAMUSCULAR | Status: AC
  Filled 2018-01-28: qty 30

## 2018-01-28 MED ORDER — HEPARIN BOLUS VIA INFUSION
1100.0000 [IU] | Freq: Once | INTRAVENOUS | Status: AC
  Administered 2018-01-28: 1100 [IU] via INTRAVENOUS
  Filled 2018-01-28: qty 1100

## 2018-01-28 MED ORDER — MIDAZOLAM HCL 2 MG/2ML IJ SOLN
INTRAMUSCULAR | Status: DC | PRN
  Administered 2018-01-28 (×3): 1 mg via INTRAVENOUS
  Administered 2018-01-28: 2 mg via INTRAVENOUS

## 2018-01-28 MED ORDER — HEPARIN SODIUM (PORCINE) 1000 UNIT/ML IJ SOLN
INTRAMUSCULAR | Status: AC
  Filled 2018-01-28: qty 1

## 2018-01-28 MED ORDER — STERILE WATER FOR INJECTION IJ SOLN
INTRAMUSCULAR | Status: AC
  Filled 2018-01-28: qty 10

## 2018-01-28 MED ORDER — FENTANYL CITRATE (PF) 100 MCG/2ML IJ SOLN
INTRAMUSCULAR | Status: AC
  Filled 2018-01-28: qty 2

## 2018-01-28 MED ORDER — HYDRALAZINE HCL 20 MG/ML IJ SOLN
INTRAMUSCULAR | Status: AC
  Filled 2018-01-28: qty 1

## 2018-01-28 MED ORDER — MORPHINE SULFATE (PF) 2 MG/ML IV SOLN
INTRAVENOUS | Status: AC
  Filled 2018-01-28: qty 2

## 2018-01-28 MED ORDER — SODIUM CHLORIDE 0.9 % IV SOLN: INTRAVENOUS | Status: DC

## 2018-01-28 MED ORDER — ONDANSETRON HCL 4 MG/2ML IJ SOLN
INTRAMUSCULAR | Status: AC
  Administered 2018-01-28: 4 mg via INTRAVENOUS
  Filled 2018-01-28: qty 2

## 2018-01-28 MED ORDER — HEPARIN SODIUM (PORCINE) 1000 UNIT/ML IJ SOLN
INTRAMUSCULAR | Status: DC | PRN
  Administered 2018-01-28: 5000 [IU] via INTRAVENOUS

## 2018-01-28 MED ORDER — HYDRALAZINE HCL 20 MG/ML IJ SOLN
10.0000 mg | Freq: Once | INTRAMUSCULAR | Status: AC
  Administered 2018-01-28: 10 mg via INTRAVENOUS

## 2018-01-28 MED ORDER — MIDAZOLAM HCL 5 MG/5ML IJ SOLN
INTRAMUSCULAR | Status: AC
  Filled 2018-01-28: qty 5

## 2018-01-28 MED ORDER — FENTANYL CITRATE (PF) 100 MCG/2ML IJ SOLN
INTRAMUSCULAR | Status: DC | PRN
  Administered 2018-01-28 (×4): 50 ug via INTRAVENOUS

## 2018-01-28 MED ORDER — ALTEPLASE 2 MG IJ SOLR
INTRAMUSCULAR | Status: DC | PRN
  Administered 2018-01-28: 4 mg

## 2018-01-28 SURGICAL SUPPLY — 27 items
BALLN LUTONIX AV 6X60X75 (BALLOONS) ×2
BALLN ULTRV 018 5X100X75 (BALLOONS) ×2
BALLOON LUTONIX AV 6X60X75 (BALLOONS) ×1 IMPLANT
BALLOON ULTRV 018 5X100X75 (BALLOONS) ×1 IMPLANT
CANISTER PENUMBRA ENGINE (MISCELLANEOUS) ×2 IMPLANT
CATH BEACON 5 .035 65 C2 TIP (CATHETERS) ×2 IMPLANT
CATH BEACON 5 .035 65 KMP TIP (CATHETERS) ×4 IMPLANT
CATH IMAGER II S 5FR 65CM (CATHETERS) ×2 IMPLANT
CATH INDIGO CAT6 KIT (CATHETERS) ×2 IMPLANT
CATH INFINITI JR4 5F (CATHETERS) ×2 IMPLANT
CATH LIMA 6F (CATHETERS) ×2 IMPLANT
CATH MHK2 5FR 80CM (CATHETERS) ×2 IMPLANT
CATH PIG 70CM (CATHETERS) ×2 IMPLANT
CATH VS15FR (CATHETERS) ×2 IMPLANT
DEVICE PRESTO INFLATION (MISCELLANEOUS) ×2 IMPLANT
DEVICE STARCLOSE SE CLOSURE (Vascular Products) ×2 IMPLANT
GLIDEWIRE ADV .035X180CM (WIRE) ×2 IMPLANT
PACK ANGIOGRAPHY (CUSTOM PROCEDURE TRAY) ×2 IMPLANT
SHEATH BRITE TIP 5FRX11 (SHEATH) ×2 IMPLANT
SHEATH BRITE TIP 6FRX11 (SHEATH) ×2 IMPLANT
SHEATH PINNACLE ST 6F 45CM (SHEATH) ×2 IMPLANT
TOWEL OR 17X26 4PK STRL BLUE (TOWEL DISPOSABLE) ×2 IMPLANT
TUBING CONTRAST HIGH PRESS 72 (TUBING) ×2 IMPLANT
VALVE HEMO TOUHY BORST Y (ADAPTER) ×2 IMPLANT
WIRE G V18X300CM (WIRE) ×2 IMPLANT
WIRE J 3MM .035X145CM (WIRE) ×2 IMPLANT
WIRE MAGIC TOR.035 180C (WIRE) ×2 IMPLANT

## 2018-01-28 NOTE — Progress Notes (Signed)
Patient clinically stable post mesenteric angiogram per Dr Lucky Cowboy. Vitals stable. Complaints of 9/10 lower abdominal pain as per pre procedure. Given morphine 4mg  iv for pain, sister at bedside, vitals stable. No bleeding nor hematoma at right groin site. Report called to pat's care nurse with questions answered. Plan reviewed.

## 2018-01-28 NOTE — Progress Notes (Signed)
ANTICOAGULATION CONSULT NOTE  Pharmacy Consult for Heparin  Indication: occluded SMA stent  No Known Allergies  Patient Measurements: Height: 5\' 3"  (160 cm) Weight: 215 lb (97.5 kg) IBW/kg (Calculated) : 52.4 Heparin Dosing Weight: 75.1 kg   Vital Signs: Temp: 98.1 F (36.7 C) (04/08 1421) Temp Source: Oral (04/08 1421) BP: 195/88 (04/08 1745) Pulse Rate: 68 (04/08 1745)  Labs: Recent Labs    01/26/18 0600 01/27/18 0523 01/27/18 0636 01/28/18 0515  HGB 10.5* 10.6*  --  10.8*  HCT 33.6* 31.8*  --  33.2*  PLT 215 193  --  234  HEPARINUNFRC 0.32  --  0.32 0.25*  CREATININE  --   --   --  1.03*    Estimated Creatinine Clearance: 66.2 mL/min (A) (by C-G formula based on SCr of 1.03 mg/dL (H)).   Medical History: Past Medical History:  Diagnosis Date  . Abdominal aortic atherosclerosis ([place])    a. 05/2017 CTA abd/pelvis: significant atherosclerotic dzs of the infrarenal abd Ao w/ some mural thrombus. No aneurysm or dissection.  . Abdominal pain   . Acute focal ischemia of small intestine (HCC)   . Acute right-sided low back pain with right-sided sciatica 06/13/2017  . AKI (acute kidney injury) ([place]) 07/29/2017  . Baker's cyst of knee, right    a. 07/2016 U/S: 4.1 x 1.4 x 2.9 cystic structure in R poplitetal fossa.  . Bell's palsy   . Cancer ([place])   . Chest pain    a. 08/2012 Lexiscan MV: EF 54%, non ischemia/infarct.  . Chronic idiopathic constipation   . Chronic mesenteric ischemia ([place])   . Diastolic dysfunction    a. 05/2017 Echo: Ef 60-65%, no rwma, Gr1 DD, no source of cardiac emboli.  . 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[place])    a. 05/2017 CTA Abd/pelvis: apparent thrombus or embolus in prox SMA (70-90%); b. 05/2017 catheter directed tPA, mechanical thrombectomy, and stenting of the SMA.  . Essential hypertension with goal blood pressure less than 140/90 01/19/2016  . Gastroesophageal reflux disease 01/19/2016  . GERD (gastroesophageal reflux disease)   .  Hyperlipidemia   . Hypertension   . Hypotension 07/29/2017  . Morbid obesity with BMI of 40.0-44.9, adult ([place])   . Occlusive mesenteric ischemia ([place]) 06/19/2017  . Personal history of other malignant neoplasm of skin 06/01/2016  . Primary osteoarthritis of both knees 06/13/2017  . Recurrent major depressive disorder ([place]) 01/19/2016  . SBO (small bowel obstruction) ([place]) 08/06/2017  . 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 Urinary tract infection 01/09/2014    Medications:  Facility-Administered Medications Prior to Admission  Medication Dose Route Frequency Provider Last Rate Last Dose  . [DISCONTINUED] amoxicillin-clavulanate (AUGMENTIN) 875-125 MG per tablet 1 tablet  1 tablet Oral Q12H Piscoya, Jose, MD       Medications Prior to Admission  Medication Sig Dispense Refill Last Dose  . acetaminophen (TYLENOL) 325 MG tablet Take 2 tablets (650 mg total) by mouth every 6 (six) hours as needed for mild pain (or Fever >/= 101).   prn at prn  . apixaban (ELIQUIS) 5 MG TABS tablet Take 1 tablet (5 mg total) by mouth 2 (two) times daily. 60 tablet 5 01/23/2018 at Unknown time  . aspirin EC 81 MG tablet Take 81 mg by mouth daily.    01/23/2018 at Unknown time  . atorvastatin (LIPITOR) 80 MG tablet Take 80 mg by mouth at bedtime.   01/23/2018 at Unknown time  . gabapentin (NEURONTIN) 300 MG  capsule Take 300 mg by mouth 3 (three) times daily.    01/23/2018 at Unknown time  . ibuprofen (ADVIL,MOTRIN) 600 MG tablet Take 1 tablet (600 mg total) by mouth every 8 (eight) hours as needed for mild pain or moderate pain. 30 tablet 0 prn at prn  . lisinopril (PRINIVIL,ZESTRIL) 40 MG tablet Take 40 mg by mouth daily.   01/23/2018 at Unknown time  . metoprolol tartrate (LOPRESSOR) 50 MG tablet Take 50 mg by mouth 2 (two) times daily.   01/23/2018 at Unknown time  . omeprazole (PRILOSEC) 20 MG capsule Take 20 mg by mouth daily.    01/23/2018 at Unknown time  . ondansetron (ZOFRAN) 4 MG tablet Take 1 tablet (4 mg  total) by mouth every 6 (six) hours as needed for nausea. 20 tablet 0 prn at prn  . senna-docusate (SENOKOT-S) 8.6-50 MG tablet Take 2 tablets by mouth at bedtime as needed for mild constipation. (Patient taking differently: Take 2 tablets by mouth at bedtime. )   01/23/2018 at Unknown time    Assessment: Pharmacy consulted to dose heparin in this 57 year old female admitted with occluded SMA stent.  Pt was on eliquis at home, pt states last dose was 4/3 AM.  CrCl = 69.5 ml/min  Goal of Therapy:  Heparin level 0.3-0.7 units/ml Monitor platelets by anticoagulation protocol: Yes   Plan:   Heparin drip was stopped previously for vascular procedure.  Restarted @1749  at previous rate of 1200 units/hr Will check heparin level in 6 hours.    Candelaria Stagers, PharmD Pharmacy Resident  01/28/18 6:07 PM

## 2018-01-28 NOTE — Op Note (Signed)
[facility] Percutaneous Study/Intervention Procedural Note   Date: 01/28/2018  Surgeon(s): [name], MD  Assistants: none  Pre-operative Diagnosis: 1.  Chronic mesenteric ischemia 2.  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 Ultrasound guidance for vascular access right femoral arter 2. Catheter placement into SMA branches from right femoral approach 3. Aortogram and selective angiogram of the SMA 4.  Catheter directed thrombolytic therapy with 4 mg of TPA delivered to the SMA 5.  Mechanical thrombectomy to the SMA including the stent and the more distal SMA beyond the primary branches with the penumbra cat 6 device 6. PTA of the SMA with 6 mm diameter Lutonix drug-coated angioplasty balloon within the previously placed stent and a 5 mm diameter by 10 cm length conventional angioplasty balloon in the mid to distal SMA beyond the primary branches 7. StarClose closure device right femoral artery  Contrast: 65 cc  Fluoro time: 25.5 minutes  EBL: 300 cc  Anesthesia: Approximately 60 minutes of Moderate conscious sedation using 5 mg of Versed and 200 mcg of Fentanyl  Indications: Patient is a 57 y.o. female who has symptoms of abdominal pain that are not clear in their etiology.  She does not really have a weight loss issue or postprandial issues with each meal, but she does have a lot of abdominal pain and CT scan which shows an occlusion of her previous SMA stent. The patient is brought in for angiography for further evaluation and potential treatment. Risks and benefits are discussed and informed consent is obtained  Procedure: The patient was identified and appropriate procedural time out was performed. The patient was then placed supine on the table and prepped and draped in the usual sterile fashion. Moderate conscious sedation was administered during a  face to face encounter with the patient throughout the procedure with my supervision of the RN administering medicines and monitoring the patient's vital signs, pulse oximetry, telemetry and mental status throughout from the start of the procedure until the patient was taken to the recovery room. Ultrasound was used to evaluate the right common femoral artery. It was patent . A digital ultrasound image was acquired. A Seldinger needle was used to access the right common femoral artery under direct ultrasound guidance and a permanent image was performed. A 0.035 J wire was advanced without resistance and a 5Fr sheath was placed. Pigtail catheter was placed into the aorta and an AP aortogram was performed. This demonstrated the aorta appeared to be patent.  The previously placed stent in the SMA appeared to be occluded.  Both renal arteries were widely patent.  Good celiac artery flow was present.  The IMA was large with large collaterals of this IMA and the celiac artery to the SMA distribution. We transitioned to the lateral projection to image the celiac and SMA. The lateral image demonstrated widely patent celiac artery with an occlusion of the SMA stent. The patient was given 5000 units of IV heparin.We upsized to a 6 Fr sheath.  A Kumpe catheter was used to selectively cannulate the SMA stent after multiple other catheters were unsuccessful.  These included a VS 1 catheter, a C2 catheter, obvious 2 catheter, and an IM guide catheter.Based on her symptoms and these findings, I elected to treat the SMA to try to improve the patient's clinical course. I crossed the lesion with a mild amount of difficulty with an advantage wire and the Kumpe catheter.  I confirm intraluminal  flow in the mid SMA beyond the primary branches.  I then instilled 4 mg of TPA with a Kumpe catheter to the proximal SMA including the previously placed stent in the area of occlusion.  This was allowed to dwell for 10-15 minutes. I  then used a 6 mm diameter x 60 mm length Lutonix drug-coated angioplasty balloon to perform percutaneous transluminal angioplasty of the proximal SMA including the previously placed stent in about a centimeter beyond the previously placed stent. I inflated the balloon to 10 Atm and held the inflation for 1 minute. On completion angiogram following this angioplasty, there was a blob of thrombus at the distal edge of the previously placed stent as well as within the stent.  I then used the penumbra cat 6 device and multiple passes with this were used which cleared the SMA stent, the proximal to mid SMA beyond the stent, but some thrombus was seen in the more distal SMA beyond the primary branches.  Passes were again made with the penumbra cat 6 device with some improvement but still a high-grade stenosis blocking several of the branches.  A 5 mm diameter by 10 cm length angioplasty balloon was then treated in the more mid to distal SMA.  This was inflated to 8 atm for 1 minute.  Completion angiogram showed marked improvement although there was 1 of the left going branches that appeared to have most of the thrombus at this point.  This was in an area where multiple other branches seem to provide excellent flow as well and so this seemed to be much less dangerous than the more proximal SMA thrombus.  At this point, I felt we had done all we could do to improve her SMA perfusion. I elected to terminate the procedure. The diagnostic catheter was removed. StarClose closure device was deployed in usual fashion with excellent hemostatic result. The patient was taken to the recovery room in stable condition having tolerated the procedure well.     Findings:Recurrent occlusion of the SMA stent.  Celiac artery, renal arteries, and aorta all appeared widely patent.  Disposition: Patient was taken to the recovery room in stable condition having tolerated the procedure well.  Complications: None  [name] 01/28/2018 5:00 PM   This note was created with Dragon Medical transcription system. Any errors in dictation are purely unintentional.

## 2018-01-28 NOTE — Progress Notes (Signed)
ANTICOAGULATION CONSULT NOTE  Pharmacy Consult for Heparin  Indication: occluded SMA stent  No Known Allergies  Patient Measurements: Height: 5\' 3"  (160 cm) Weight: 215 lb (97.5 kg) IBW/kg (Calculated) : 52.4 Heparin Dosing Weight: 75.1 kg   Vital Signs: Temp: 98.2 F (36.8 C) (04/07 2144) Temp Source: Oral (04/07 2144) BP: 105/59 (04/07 2215) Pulse Rate: 56 (04/07 2144)  Labs: Recent Labs    01/26/18 0600 01/27/18 0523 01/27/18 0636 01/28/18 0515  HGB 10.5* 10.6*  --  10.8*  HCT 33.6* 31.8*  --  33.2*  PLT 215 193  --  234  HEPARINUNFRC 0.32  --  0.32 0.25*  CREATININE  --   --   --  1.03*    Estimated Creatinine Clearance: 66.2 mL/min (A) (by C-G formula based on SCr of 1.03 mg/dL (H)).   Medical History: Past Medical History:  Diagnosis Date  . Abdominal aortic atherosclerosis ([name])    a. 05/2017 CTA abd/pelvis: significant atherosclerotic dzs of the infrarenal abd Ao w/ some mural thrombus. No aneurysm or dissection.  . Abdominal pain   . Acute focal ischemia of small intestine (HCC)   . Acute right-sided low back pain with right-sided sciatica 06/13/2017  . AKI (acute kidney injury) ([place]) 07/29/2017  . Baker's cyst of knee, right    a. 07/2016 U/S: 4.1 x 1.4 x 2.9 cystic structure in R poplitetal fossa.  . Bell's palsy   . Cancer ([place])   . Chest pain    a. 08/2012 Lexiscan MV: EF 54%, non ischemia/infarct.  . Chronic idiopathic constipation   . Chronic mesenteric ischemia ([place])   . Diastolic dysfunction    a. 05/2017 Echo: Ef 60-65%, no rwma, Gr1 DD, no source of cardiac emboli.  . 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[place])    a. 05/2017 CTA Abd/pelvis: apparent thrombus or embolus in prox SMA (70-90%); b. 05/2017 catheter directed tPA, mechanical thrombectomy, and stenting of the SMA.  . Essential hypertension with goal blood pressure less than 140/90 01/19/2016  . Gastroesophageal reflux disease 01/19/2016  . GERD (gastroesophageal reflux disease)   .  Hyperlipidemia   . Hypertension   . Hypotension 07/29/2017  . Morbid obesity with BMI of 40.0-44.9, adult ([place])   . Occlusive mesenteric ischemia ([place]) 06/19/2017  . Personal history of other malignant neoplasm of skin 06/01/2016  . Primary osteoarthritis of both knees 06/13/2017  . Recurrent major depressive disorder ([place]) 01/19/2016  . SBO (small bowel obstruction) ([place]) 08/06/2017  . 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 Urinary tract infection 01/09/2014    Medications:  Facility-Administered Medications Prior to Admission  Medication Dose Route Frequency Provider Last Rate Last Dose  . [DISCONTINUED] amoxicillin-clavulanate (AUGMENTIN) 875-125 MG per tablet 1 tablet  1 tablet Oral Q12H Piscoya, Jose, MD       Medications Prior to Admission  Medication Sig Dispense Refill Last Dose  . acetaminophen (TYLENOL) 325 MG tablet Take 2 tablets (650 mg total) by mouth every 6 (six) hours as needed for mild pain (or Fever >/= 101).   prn at prn  . apixaban (ELIQUIS) 5 MG TABS tablet Take 1 tablet (5 mg total) by mouth 2 (two) times daily. 60 tablet 5 01/23/2018 at Unknown time  . aspirin EC 81 MG tablet Take 81 mg by mouth daily.    01/23/2018 at Unknown time  . atorvastatin (LIPITOR) 80 MG tablet Take 80 mg by mouth at bedtime.   01/23/2018 at Unknown time  . gabapentin (NEURONTIN) 300 MG  capsule Take 300 mg by mouth 3 (three) times daily.    01/23/2018 at Unknown time  . ibuprofen (ADVIL,MOTRIN) 600 MG tablet Take 1 tablet (600 mg total) by mouth every 8 (eight) hours as needed for mild pain or moderate pain. 30 tablet 0 prn at prn  . lisinopril (PRINIVIL,ZESTRIL) 40 MG tablet Take 40 mg by mouth daily.   01/23/2018 at Unknown time  . metoprolol tartrate (LOPRESSOR) 50 MG tablet Take 50 mg by mouth 2 (two) times daily.   01/23/2018 at Unknown time  . omeprazole (PRILOSEC) 20 MG capsule Take 20 mg by mouth daily.    01/23/2018 at Unknown time  . ondansetron (ZOFRAN) 4 MG tablet Take 1 tablet (4 mg  total) by mouth every 6 (six) hours as needed for nausea. 20 tablet 0 prn at prn  . senna-docusate (SENOKOT-S) 8.6-50 MG tablet Take 2 tablets by mouth at bedtime as needed for mild constipation. (Patient taking differently: Take 2 tablets by mouth at bedtime. )   01/23/2018 at Unknown time    Assessment: Pharmacy consulted to dose heparin in this 57 year old female admitted with occluded SMA stent.  Pt was on eliquis at home, pt states last dose was 4/3 AM.  CrCl = 69.5 ml/min  Goal of Therapy:  Heparin level 0.3-0.7 units/ml Monitor platelets by anticoagulation protocol: Yes   Plan:  Give 4500 units bolus x 1 Start heparin infusion at 1300 units/hr Check anti-Xa level in 6 hours and daily while on heparin Continue to monitor H&H and platelets  Will monitor aPTT if baseline HL is elevated.   HL = 0.77, supratherapeutic. I will decrease the rate to 1050 units/hr and obtain a follow up level in 6 hours. Pharmacy will continue to follow and adjust.    Lendon Ka, PharmD Pharmacy Resident 01/28/2018,5:48 AM    4/5 PM heparin level 0.40. Continue current regimen. Recheck heparin level and CBC with tomorrow AM labs.  4/6 AM heparin level 0.32. Continue current regimen. Recheck heparin level and CBC with tomorrow AM labs.  4/7 AM heparin level 0.32. Continue current regimen. Recheck heparin level and CBC with tomorrow AM labs.  4/8 AM heparin level 0.25. 1100 unit bolus and increase rate to 1200 units/hr. Recheck in 6 hours.  Sim Boast, PharmD, BCPS  01/28/18 5:48 AM

## 2018-01-28 NOTE — Progress Notes (Signed)
[facility] NAME: Carla [name]    MR#:  [id number]  DATE OF BIRTH:  29-May-1961  SUBJECTIVE:   Still having diarrhea but stool for C. Diff is (-).  Still complaining of abdominal pain/bloating and going for Angiogram later today.   REVIEW OF SYSTEMS:    Review of Systems  Constitutional: Negative for chills and fever.  HENT: Negative for congestion and tinnitus.   Eyes: Negative for blurred vision and double vision.  Respiratory: Negative for cough, shortness of breath and wheezing.   Cardiovascular: Negative for chest pain, orthopnea and PND.  Gastrointestinal: Positive for abdominal pain. Negative for diarrhea, nausea and vomiting.  Genitourinary: Negative for dysuria and hematuria.  Neurological: Negative for dizziness, sensory change and focal weakness.  All other systems reviewed and are negative.   Nutrition: heart Healthy/Carb control Tolerating Diet: yes Tolerating PT: Ambulatory  DRUG ALLERGIES:  No Known Allergies  VITALS:  Blood pressure 115/74, pulse 68, temperature 98.1 F (36.7 C), temperature source Oral, resp. rate 19, height 5\' 3"  (1.6 m), weight 97.5 kg (215 lb), SpO2 98 %.  PHYSICAL EXAMINATION:   Physical Exam  GENERAL:  57 y.o.-year-old patient lying in bed in mild GI distress.   EYES: Pupils equal, round, reactive to light and accommodation. No scleral icterus. Extraocular muscles intact.  HEENT: Head atraumatic, normocephalic. Oropharynx and nasopharynx clear.  NECK:  Supple, no jugular venous distention. No thyroid enlargement, no tenderness.  LUNGS: Normal breath sounds bilaterally, no wheezing, rales, rhonchi. No use of accessory muscles of respiration.  CARDIOVASCULAR: S1, S2 normal. No murmurs, rubs, or gallops.  ABDOMEN: Soft, nontender, nondistended. Bowel sounds present. No organomegaly or mass.  EXTREMITIES: No cyanosis, clubbing, +1-2 edema b/l    NEUROLOGIC: Cranial nerves II through XII  are intact. No focal Motor or sensory deficits b/l.   PSYCHIATRIC: The patient is alert and oriented x 3.  SKIN: No obvious rash, lesion, or ulcer.    LABORATORY PANEL:   CBC Recent Labs  Lab 01/28/18 0515  WBC 5.5  HGB 10.8*  HCT 33.2*  PLT 234   ------------------------------------------------------------------------------------------------------------------  Chemistries  Recent Labs  Lab 01/24/18 1808  01/28/18 0515  NA 138   < > 140  K 3.4*   < > 3.9  CL 107   < > 111  CO2 22   < > 22  GLUCOSE 109*   < > 112*  BUN 9   < > 11  CREATININE 0.98   < > 1.03*  CALCIUM 8.9   < > 8.6*  MG  --   --  2.0  AST 17  --   --   ALT 12*  --   --   ALKPHOS 91  --   --   BILITOT 0.4  --   --    < > = values in this interval not displayed.   ------------------------------------------------------------------------------------------------------------------  Cardiac Enzymes No results for input(s): TROPONINI in the last 168 hours. ------------------------------------------------------------------------------------------------------------------  RADIOLOGY:  No results found.   ASSESSMENT AND PLAN:   57 year old female with past medical history of hypertension, hyperlipidemia, peripheral vascular disease, osteoarthritis, obesity, GERD, previous history of superior mesenteric thrombosis who presents to the hospital due to abdominal pain and noted to have mesenteric ischemia.  1.  Mesenteric ischemia-patient presented to the hospital due to worsening abdominal pain and CT scan findings were suggestive of SMA stent restenosis.   -Continue supportive care with pain control, continue heparin  drip.  Seen by vascular surgery and plan for angiogram today.   2.  Essential hypertension-continue Metoprolol, lisinopril.  3.  Hyperlipidemia-continue atorvastatin.  4.  Anxiety-continue Xanax.  5.  Neuropathy-continue gabapentin.  6. Diarrhea -continue to hold laxatives, continue Imodium  as needed.  Stool for C. difficile was negative.  7.  Tobacco abuse-continue nicotine patch.  Disposition pending angiogram today.  All the records are reviewed and case discussed with Care Management/Social Worker. Management plans discussed with the patient, family and they are in agreement.  CODE STATUS: Full code  DVT Prophylaxis: Heparin drip  TOTAL TIME TAKING CARE OF THIS PATIENT: 25 minutes.   POSSIBLE D/C IN 2-3 DAYS, DEPENDING ON CLINICAL CONDITION.   Henreitta Leber M.D on 01/28/2018 at 3:59 PM  Between 7am to 6pm - Pager - [phone]  After 6pm go to [email or web address] - Proofreader  [facility]  Office  [phone]  CC: Primary care physician; Irean Hong, MD

## 2018-01-28 NOTE — H&P (Signed)
[facility] History & Physical Update  The patient was interviewed and re-examined.  The patient's previous History and Physical has been reviewed and is unchanged.  There is no change in the plan of care. We plan to proceed with the scheduled procedure.  [name], MD  01/28/2018, 3:06 PM

## 2018-01-29 ENCOUNTER — Encounter: Payer: Self-pay | Admitting: Vascular Surgery

## 2018-01-29 DIAGNOSIS — K558 Other vascular disorders of intestine: Secondary | ICD-10-CM

## 2018-01-29 DIAGNOSIS — R109 Unspecified abdominal pain: Secondary | ICD-10-CM

## 2018-01-29 LAB — HEPARIN LEVEL (UNFRACTIONATED)
Heparin Unfractionated: 0.49 IU/mL (ref 0.30–0.70)
Heparin Unfractionated: 0.5 IU/mL (ref 0.30–0.70)

## 2018-01-29 MED ORDER — ADULT MULTIVITAMIN W/MINERALS CH
1.0000 | ORAL_TABLET | Freq: Every day | ORAL | Status: DC
  Administered 2018-01-29 – 2018-01-31 (×3): 1 via ORAL
  Filled 2018-01-29 (×3): qty 1

## 2018-01-29 MED ORDER — AMLODIPINE BESYLATE 5 MG PO TABS
5.0000 mg | ORAL_TABLET | Freq: Every day | ORAL | Status: DC
  Administered 2018-01-29 – 2018-01-30 (×2): 5 mg via ORAL
  Filled 2018-01-29 (×3): qty 1

## 2018-01-29 MED ORDER — PROMETHAZINE HCL 25 MG/ML IJ SOLN
12.5000 mg | Freq: Four times a day (QID) | INTRAMUSCULAR | Status: DC | PRN
  Administered 2018-01-29: 12.5 mg via INTRAVENOUS
  Filled 2018-01-29: qty 1

## 2018-01-29 MED ORDER — ALUM & MAG HYDROXIDE-SIMETH 200-200-20 MG/5ML PO SUSP
30.0000 mL | Freq: Four times a day (QID) | ORAL | Status: DC | PRN
  Filled 2018-01-29: qty 30

## 2018-01-29 MED ORDER — APIXABAN 2.5 MG PO TABS
5.0000 mg | ORAL_TABLET | Freq: Two times a day (BID) | ORAL | Status: DC
  Administered 2018-01-29 – 2018-01-31 (×5): 5 mg via ORAL
  Filled 2018-01-29 (×5): qty 2

## 2018-01-29 MED ORDER — ENSURE ENLIVE PO LIQD
237.0000 mL | Freq: Two times a day (BID) | ORAL | Status: DC
  Administered 2018-01-29 – 2018-01-31 (×4): 237 mL via ORAL

## 2018-01-29 NOTE — Discharge Instructions (Signed)
You may remove your right groin dressing. You may shower.

## 2018-01-29 NOTE — Care Management (Signed)
RNCM consult for medication assistance.  Full assessment to follow

## 2018-01-29 NOTE — Progress Notes (Signed)
ANTICOAGULATION CONSULT NOTE  Pharmacy Consult for Heparin  Indication: occluded SMA stent  No Known Allergies  Patient Measurements: Height: 5\' 3"  (160 cm) Weight: 215 lb (97.5 kg) IBW/kg (Calculated) : 52.4 Heparin Dosing Weight: 75.1 kg   Vital Signs: Temp: 98.4 F (36.9 C) (04/08 2013) Temp Source: Axillary (04/08 1810) BP: 190/89 (04/08 2013) Pulse Rate: 95 (04/08 2013)  Labs: Recent Labs    01/26/18 0600 01/27/18 0523 01/27/18 0636 01/28/18 0515 01/29/18 0016  HGB 10.5* 10.6*  --  10.8*  --   HCT 33.6* 31.8*  --  33.2*  --   PLT 215 193  --  234  --   HEPARINUNFRC 0.32  --  0.32 0.25* 0.50  CREATININE  --   --   --  1.03*  --     Estimated Creatinine Clearance: 66.2 mL/min (A) (by C-G formula based on SCr of 1.03 mg/dL (H)).   Medical History: Past Medical History:  Diagnosis Date  . Abdominal aortic atherosclerosis ([place])    a. 05/2017 CTA abd/pelvis: significant atherosclerotic dzs of the infrarenal abd Ao w/ some mural thrombus. No aneurysm or dissection.  . Abdominal pain   . Acute focal ischemia of small intestine (HCC)   . Acute right-sided low back pain with right-sided sciatica 06/13/2017  . AKI (acute kidney injury) ([place]) 07/29/2017  . Baker's cyst of knee, right    a. 07/2016 U/S: 4.1 x 1.4 x 2.9 cystic structure in R poplitetal fossa.  . Bell's palsy   . Cancer ([place])   . Chest pain    a. 08/2012 Lexiscan MV: EF 54%, non ischemia/infarct.  . Chronic idiopathic constipation   . Chronic mesenteric ischemia ([place])   . Diastolic dysfunction    a. 05/2017 Echo: Ef 60-65%, no rwma, Gr1 DD, no source of cardiac emboli.  . 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 05/2017 CTA Abd/pelvis: apparent thrombus or embolus in prox SMA (70-90%); b. 05/2017 catheter directed tPA, mechanical thrombectomy, and stenting of the SMA.  . Essential hypertension with goal blood pressure less than 140/90 01/19/2016  . Gastroesophageal reflux disease 01/19/2016  . GERD  (gastroesophageal reflux disease)   . Hyperlipidemia   . Hypertension   . Hypotension 07/29/2017  . Morbid obesity with BMI of 40.0-44.9, adult ([place])   . Occlusive mesenteric ischemia ([place]) 06/19/2017  . Personal history of other malignant neoplasm of skin 06/01/2016  . Primary osteoarthritis of both knees 06/13/2017  . Recurrent major depressive disorder ([place]) 01/19/2016  . SBO (small bowel obstruction) ([place]) 08/06/2017  . 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 Urinary tract infection 01/09/2014    Medications:  Facility-Administered Medications Prior to Admission  Medication Dose Route Frequency Provider Last Rate Last Dose  . [DISCONTINUED] amoxicillin-clavulanate (AUGMENTIN) 875-125 MG per tablet 1 tablet  1 tablet Oral Q12H Piscoya, Jose, MD       Medications Prior to Admission  Medication Sig Dispense Refill Last Dose  . acetaminophen (TYLENOL) 325 MG tablet Take 2 tablets (650 mg total) by mouth every 6 (six) hours as needed for mild pain (or Fever >/= 101).   prn at prn  . apixaban (ELIQUIS) 5 MG TABS tablet Take 1 tablet (5 mg total) by mouth 2 (two) times daily. 60 tablet 5 01/23/2018 at Unknown time  . aspirin EC 81 MG tablet Take 81 mg by mouth daily.    01/23/2018 at Unknown time  . atorvastatin (LIPITOR) 80 MG tablet Take 80 mg by  mouth at bedtime.   01/23/2018 at Unknown time  . gabapentin (NEURONTIN) 300 MG capsule Take 300 mg by mouth 3 (three) times daily.    01/23/2018 at Unknown time  . ibuprofen (ADVIL,MOTRIN) 600 MG tablet Take 1 tablet (600 mg total) by mouth every 8 (eight) hours as needed for mild pain or moderate pain. 30 tablet 0 prn at prn  . lisinopril (PRINIVIL,ZESTRIL) 40 MG tablet Take 40 mg by mouth daily.   01/23/2018 at Unknown time  . metoprolol tartrate (LOPRESSOR) 50 MG tablet Take 50 mg by mouth 2 (two) times daily.   01/23/2018 at Unknown time  . omeprazole (PRILOSEC) 20 MG capsule Take 20 mg by mouth daily.    01/23/2018 at Unknown time  . ondansetron  (ZOFRAN) 4 MG tablet Take 1 tablet (4 mg total) by mouth every 6 (six) hours as needed for nausea. 20 tablet 0 prn at prn  . senna-docusate (SENOKOT-S) 8.6-50 MG tablet Take 2 tablets by mouth at bedtime as needed for mild constipation. (Patient taking differently: Take 2 tablets by mouth at bedtime. )   01/23/2018 at Unknown time    Assessment: Pharmacy consulted to dose heparin in this 57 year old female admitted with occluded SMA stent.  Pt was on eliquis at home, pt states last dose was 4/3 AM.  CrCl = 69.5 ml/min  Goal of Therapy:  Heparin level 0.3-0.7 units/ml Monitor platelets by anticoagulation protocol: Yes   Plan:   Heparin drip was stopped previously for vascular procedure.  Restarted @1749  at previous rate of 1200 units/hr Will check heparin level in 6 hours.   04/09 0000 HL 0.50 therapeutic. Will continue current rate and will recheck anti-Xa @ 0600.  Tobie Lords, PharmD, BCPS Clinical Pharmacist 01/29/2018

## 2018-01-29 NOTE — Progress Notes (Signed)
Initial Nutrition Assessment  DOCUMENTATION CODES:   Obesity unspecified  INTERVENTION:   Ensure Enlive po BID, each supplement provides 350 kcal and 20 grams of protein  MVI daily  Liberalize diet  NUTRITION DIAGNOSIS:   Inadequate oral intake related to acute illness, poor appetite as evidenced by meal completion < 25%.  GOAL:   Patient will meet greater than or equal to 90% of their needs  MONITOR:   PO intake, Supplement acceptance, Labs, Weight trends, I & O's  REASON FOR ASSESSMENT:   NPO/Clear Liquid Diet    ASSESSMENT:   57 year old female with past medical history of hypertension, hyperlipidemia, peripheral vascular disease, osteoarthritis, obesity, GERD, previous history of superior mesenteric thrombosis who presents to the hospital due to abdominal pain and noted to have mesenteric ischemia.   Pt s/p angiogram 4/8  Met with pt in room today. RD familiar with this pt from previous admits. Pt lethargic and sleepy at time of RD visit. Spoke with pt's sister at bedside who reports pt with poor appetite and oral intake for several days pta. Pt's lunch tray was untouched on her side table. Per chart, pt appears to have had some weight gain over the past 6 months. Pt likes chocolate Ensure; RD will order supplements and MVI. Recommend liberalize diet. Pt advanced to regular diet today. Pt noted to have type 7 stool today.    Medications reviewed and include: aspirin, nicotine, protonix, morphine, zofran, oxycodone  Labs reviewed: K 3.9 wnl, creat 1.03(H), Ca 8.6(L), Mg 2.0 wnl- 4/8   NUTRITION - FOCUSED PHYSICAL EXAM:    Most Recent Value  Orbital Region  No depletion  Upper Arm Region  No depletion  Thoracic and Lumbar Region  No depletion  Buccal Region  No depletion  Temple Region  No depletion  Clavicle Bone Region  No depletion  Clavicle and Acromion Bone Region  No depletion  Scapular Bone Region  No depletion  Dorsal Hand  No depletion  Patellar  Region  No depletion  Anterior Thigh Region  No depletion  Posterior Calf Region  No depletion  Edema (RD Assessment)  Mild  Hair  Reviewed  Eyes  Reviewed  Mouth  Reviewed  Skin  Reviewed  Nails  Reviewed     Diet Order:  Diet Heart Room service appropriate? Yes; Fluid consistency: Thin  EDUCATION NEEDS:   Not appropriate for education at this time  Skin:  Skin Assessment: Reviewed RN Assessment  Last BM:  4/8- type 7  Height:   Ht Readings from Last 1 Encounters:  01/28/18 _0  (1.6 m)    Weight:   Wt Readings from Last 1 Encounters:  01/29/18 224 lb 8 oz (101.8 kg)    Ideal Body Weight:  52 kg  BMI:  Body mass index is 39.77 kg/m.  Estimated Nutritional Needs:   Kcal:  1700-2000kcal/day   Protein:  81-102g/day   Fluid:  1.3L/day   Koleen Distance MS, RD, LDN Pager #[phone] After Hours Pager: [phone]

## 2018-01-29 NOTE — Progress Notes (Signed)
[facility]  Daily Progress Note   Subjective: 1 Day Post-Op: Ultrasound guidance for vascular access right femoral artery, Catheter placement into SMA branches from right femoral approach, Aortogram and selective angiogram of the SMA, Catheter directed thrombolytic therapy with 4 mg of TPA delivered to the SMA, Mechanical thrombectomy to the SMA including the stent and the more distal SMA beyond the primary branches with the penumbra cat 6 device, PTA of the SMA with 6 mm diameter Lutonix, drug-coated angioplasty balloon within the previously placed stent and a 74mm diameter by 10cm length conventional angioplasty balloon in the mid to distal SMA beyond the primary branches with StarClose closure device right femoral artery  Patient continues to complain of abdominal pain.  Patient states she is nauseous however has not vomited.  Patient states she is still experiencing diarrhea.  The patient was somewhat narcotized during our examination and was falling asleep during our conversation.  Objective: Vitals:   01/29/18 0216 01/29/18 0429 01/29/18 1334 01/29/18 1416  BP: (!) 160/95 (!) 168/99 138/87   Pulse: 76 75 (!) 58   Resp:  19 16   Temp:  98 F (36.7 C) 98 F (36.7 C)   TempSrc:  Oral Oral   SpO2:  100% 99%   Weight:    224 lb 8 oz (101.8 kg)  Height:        Intake/Output Summary (Last 24 hours) at 01/29/2018 1643 Last data filed at 01/29/2018 1140 Gross per 24 hour  Intake 1539 ml  Output 2025 ml  Net -486 ml   Physical Exam: A&Ox3, NAD CV: RRR Pulmonary: CTA Bilaterally Abdomen: Soft, Nontender, Nondistended, (+) Bowel Sounds Right Groin: Procedure dressing in place.  Minimal drainage on dressing.  There is been swelling to the area. Vascular: Bilateral lower extremity is nontender and warm.   Laboratory: CBC    Component Value Date/Time   WBC 5.5 01/28/2018 0515   HGB 10.8 (L) 01/28/2018 0515   HGB 13.4 02/12/2015 1137   HCT 33.2 (L) 01/28/2018  0515   HCT 40.9 02/12/2015 1137   PLT 234 01/28/2018 0515   PLT 272 02/12/2015 1137   BMET    Component Value Date/Time   NA 140 01/28/2018 0515   NA 140 02/12/2015 1137   K 3.9 01/28/2018 0515   K 2.9 (L) 02/12/2015 1137   CL 111 01/28/2018 0515   CL 107 02/12/2015 1137   CO2 22 01/28/2018 0515   CO2 27 02/12/2015 1137   GLUCOSE 112 (H) 01/28/2018 0515   GLUCOSE 120 (H) 02/12/2015 1137   BUN 11 01/28/2018 0515   BUN 15 02/12/2015 1137   CREATININE 1.03 (H) 01/28/2018 0515   CREATININE 0.78 02/12/2015 1137   CALCIUM 8.6 (L) 01/28/2018 0515   CALCIUM 9.0 02/12/2015 1137   GFRNONAA 59 (L) 01/28/2018 0515   GFRNONAA >60 02/12/2015 1137   GFRAA >60 01/28/2018 0515   GFRAA >60 02/12/2015 1137   Assessment/Planning: The patient is a 57 year old female who presented to [facility] with restenosis of her SMA now status post mesenteric angiogram and revascularization of the SMA - Stable 1) Heparin drip stopped this morning.  Patient to be discharged on aspirin 81mg  on Eliquis 5mg  one tab by mouth twice daily 2) Would consider decreasing the patient's pain medication.  Patient was clearly narcotized during our examination - falling asleep during our conversation. I will stop the patient's as needed morphine. 3) Consulted case management yesterday to see if any assistance  could be provided to help the patient pay for her medications 4) Tried to have another conversation with the patient today about the importance of follow-up in our office.  5) From vascular standpoint, possible discharge home tomorrow if abdominal pain improves / tolerating a regular diet  Discussed with Dr. Ellis Parents Osker Ayoub PA-C 01/29/2018 4:43 PM

## 2018-01-29 NOTE — Progress Notes (Signed)
ANTICOAGULATION CONSULT NOTE  Pharmacy Consult for Heparin  Indication: occluded SMA stent  No Known Allergies  Patient Measurements: Height: 5\' 3"  (160 cm) Weight: 215 lb (97.5 kg) IBW/kg (Calculated) : 52.4 Heparin Dosing Weight: 75.1 kg   Vital Signs: Temp: 98 F (36.7 C) (04/09 0429) Temp Source: Oral (04/09 0429) BP: 168/99 (04/09 0429) Pulse Rate: 75 (04/09 0429)  Labs: Recent Labs    01/27/18 0523  01/28/18 0515 01/29/18 0016 01/29/18 0552  HGB 10.6*  --  10.8*  --   --   HCT 31.8*  --  33.2*  --   --   PLT 193  --  234  --   --   HEPARINUNFRC  --    < > 0.25* 0.50 0.49  CREATININE  --   --  1.03*  --   --    < > = values in this interval not displayed.    Estimated Creatinine Clearance: 66.2 mL/min (A) (by C-G formula based on SCr of 1.03 mg/dL (H)).   Medical History: Past Medical History:  Diagnosis Date  . Abdominal aortic atherosclerosis ([place])    a. 05/2017 CTA abd/pelvis: significant atherosclerotic dzs of the infrarenal abd Ao w/ some mural thrombus. No aneurysm or dissection.  . Abdominal pain   . Acute focal ischemia of small intestine (HCC)   . Acute right-sided low back pain with right-sided sciatica 06/13/2017  . AKI (acute kidney injury) ([place]) 07/29/2017  . Baker's cyst of knee, right    a. 07/2016 U/S: 4.1 x 1.4 x 2.9 cystic structure in R poplitetal fossa.  . Bell's palsy   . Cancer ([place])   . Chest pain    a. 08/2012 Lexiscan MV: EF 54%, non ischemia/infarct.  . Chronic idiopathic constipation   . Chronic mesenteric ischemia ([place])   . Diastolic dysfunction    a. 05/2017 Echo: Ef 60-65%, no rwma, Gr1 DD, no source of cardiac emboli.  . 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 05/2017 CTA Abd/pelvis: apparent thrombus or embolus in prox SMA (70-90%); b. 05/2017 catheter directed tPA, mechanical thrombectomy, and stenting of the SMA.  . Essential hypertension with goal blood pressure less than 140/90 01/19/2016  . Gastroesophageal  reflux disease 01/19/2016  . GERD (gastroesophageal reflux disease)   . Hyperlipidemia   . Hypertension   . Hypotension 07/29/2017  . Morbid obesity with BMI of 40.0-44.9, adult ([place])   . Occlusive mesenteric ischemia ([place]) 06/19/2017  . Personal history of other malignant neoplasm of skin 06/01/2016  . Primary osteoarthritis of both knees 06/13/2017  . Recurrent major depressive disorder ([place]) 01/19/2016  . SBO (small bowel obstruction) ([place]) 08/06/2017  . 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 Urinary tract infection 01/09/2014    Medications:  Facility-Administered Medications Prior to Admission  Medication Dose Route Frequency Provider Last Rate Last Dose  . [DISCONTINUED] amoxicillin-clavulanate (AUGMENTIN) 875-125 MG per tablet 1 tablet  1 tablet Oral Q12H Piscoya, Jose, MD       Medications Prior to Admission  Medication Sig Dispense Refill Last Dose  . acetaminophen (TYLENOL) 325 MG tablet Take 2 tablets (650 mg total) by mouth every 6 (six) hours as needed for mild pain (or Fever >/= 101).   prn at prn  . apixaban (ELIQUIS) 5 MG TABS tablet Take 1 tablet (5 mg total) by mouth 2 (two) times daily. 60 tablet 5 01/23/2018 at Unknown time  . aspirin EC 81 MG tablet Take 81 mg by mouth  daily.    01/23/2018 at Unknown time  . atorvastatin (LIPITOR) 80 MG tablet Take 80 mg by mouth at bedtime.   01/23/2018 at Unknown time  . gabapentin (NEURONTIN) 300 MG capsule Take 300 mg by mouth 3 (three) times daily.    01/23/2018 at Unknown time  . ibuprofen (ADVIL,MOTRIN) 600 MG tablet Take 1 tablet (600 mg total) by mouth every 8 (eight) hours as needed for mild pain or moderate pain. 30 tablet 0 prn at prn  . lisinopril (PRINIVIL,ZESTRIL) 40 MG tablet Take 40 mg by mouth daily.   01/23/2018 at Unknown time  . metoprolol tartrate (LOPRESSOR) 50 MG tablet Take 50 mg by mouth 2 (two) times daily.   01/23/2018 at Unknown time  . omeprazole (PRILOSEC) 20 MG capsule Take 20 mg by mouth daily.    01/23/2018 at  Unknown time  . ondansetron (ZOFRAN) 4 MG tablet Take 1 tablet (4 mg total) by mouth every 6 (six) hours as needed for nausea. 20 tablet 0 prn at prn  . senna-docusate (SENOKOT-S) 8.6-50 MG tablet Take 2 tablets by mouth at bedtime as needed for mild constipation. (Patient taking differently: Take 2 tablets by mouth at bedtime. )   01/23/2018 at Unknown time    Assessment: Pharmacy consulted to dose heparin in this 57 year old female admitted with occluded SMA stent.  Pt was on eliquis at home, pt states last dose was 4/3 AM.  CrCl = 69.5 ml/min  Goal of Therapy:  Heparin level 0.3-0.7 units/ml Monitor platelets by anticoagulation protocol: Yes   Plan:   Heparin drip was stopped previously for vascular procedure.  Restarted @1749  at previous rate of 1200 units/hr Will check heparin level in 6 hours.   04/09 0000 HL 0.50 therapeutic. Will continue current rate and will recheck anti-Xa @ 0600.  04/09 0600 HL 0.49 therapeutic. Will continue current rate and will recheck next HL w/ am labs.  Tobie Lords, PharmD, BCPS Clinical Pharmacist 01/29/2018

## 2018-01-29 NOTE — Progress Notes (Signed)
Notified Dr Anselm Jungling of BP 190/89 and BP has been elevated all day. He stated continue to monitor BP and administer scheduled antihypertensive meds. Will continue to monitor patient.

## 2018-01-29 NOTE — Progress Notes (Signed)
[facility] at [place] NAME: Carla [name]    MR#:  [id number]  DATE OF BIRTH:  [birth year]  SUBJECTIVE:   Patient is status post mesenteric angiogram yesterday and TPA to the SMA with mechanical thrombectomy to the SMA and stent placement to the distal SMA.  Patient still having some vague abdominal pain and diarrhea.  Diarrhea although has not been witnessed by the nursing staff.  REVIEW OF SYSTEMS:    Review of Systems  Constitutional: Negative for chills and fever.  HENT: Negative for congestion and tinnitus.   Eyes: Negative for blurred vision and double vision.  Respiratory: Negative for cough, shortness of breath and wheezing.   Cardiovascular: Negative for chest pain, orthopnea and PND.  Gastrointestinal: Positive for abdominal pain. Negative for diarrhea, nausea and vomiting.  Genitourinary: Negative for dysuria and hematuria.  Neurological: Negative for dizziness, sensory change and focal weakness.  All other systems reviewed and are negative.   Nutrition: heart Healthy/Carb control Tolerating Diet: yes Tolerating PT: Ambulatory  DRUG ALLERGIES:  No Known Allergies  VITALS:  Blood pressure 138/87, pulse (!) 58, temperature 98 F (36.7 C), temperature source Oral, resp. rate 16, height 5\' 3"  (1.6 m), weight 101.8 kg (224 lb 8 oz), SpO2 99 %.  PHYSICAL EXAMINATION:   Physical Exam  GENERAL:  57 y.o.-year-old patient lying in bed in mild GI distress.   EYES: Pupils equal, round, reactive to light and accommodation. No scleral icterus. Extraocular muscles intact.  HEENT: Head atraumatic, normocephalic. Oropharynx and nasopharynx clear.  NECK:  Supple, no jugular venous distention. No thyroid enlargement, no tenderness.  LUNGS: Normal breath sounds bilaterally, no wheezing, rales, rhonchi. No use of accessory muscles of respiration.  CARDIOVASCULAR: S1, S2 normal. No murmurs, rubs, or gallops.  ABDOMEN: Soft, Tender in upper  abdomen but no rebound, rigidity, nondistended. Bowel sounds present. No organomegaly or mass.  EXTREMITIES: No cyanosis, clubbing, +1-2 edema b/l    NEUROLOGIC: Cranial nerves II through XII are intact. No focal Motor or sensory deficits b/l.   PSYCHIATRIC: The patient is alert and oriented x 3.  SKIN: No obvious rash, lesion, or ulcer.    LABORATORY PANEL:   CBC Recent Labs  Lab 01/28/18 0515  WBC 5.5  HGB 10.8*  HCT 33.2*  PLT 234   ------------------------------------------------------------------------------------------------------------------  Chemistries  Recent Labs  Lab 01/24/18 1808  01/28/18 0515  NA 138   < > 140  K 3.4*   < > 3.9  CL 107   < > 111  CO2 22   < > 22  GLUCOSE 109*   < > 112*  BUN 9   < > 11  CREATININE 0.98   < > 1.03*  CALCIUM 8.9   < > 8.6*  MG  --   --  2.0  AST 17  --   --   ALT 12*  --   --   ALKPHOS 91  --   --   BILITOT 0.4  --   --    < > = values in this interval not displayed.   ------------------------------------------------------------------------------------------------------------------  Cardiac Enzymes No results for input(s): TROPONINI in the last 168 hours. ------------------------------------------------------------------------------------------------------------------  RADIOLOGY:  No results found.   ASSESSMENT AND PLAN:   57 year old female with past medical history of hypertension, hyperlipidemia, peripheral vascular disease, osteoarthritis, obesity, GERD, previous history of superior mesenteric thrombosis who presents to the hospital due to abdominal pain and noted to have mesenteric ischemia.  1.  Mesenteric ischemia-patient presented to the hospital due to worsening abdominal pain and CT scan findings were suggestive of SMA stent restenosis.   -Status post mesenteric angiogram with thrombolysis to the SMA with stent placement yesterday.  Switched over from IV heparin drip to oral Eliquis.  Patient still having  some vague abdominal pain.  Discussed with vascular surgery and likely discharge tomorrow and will advance diet today.  2.  Essential hypertension-continue Metoprolol, lisinopril.  3.  Hyperlipidemia-continue atorvastatin.  4.  Anxiety-continue Xanax.  5.  Neuropathy-continue gabapentin.  6. Diarrhea -still having some diarrhea but it is not witnessed by nursing staff.  Continue to hold laxatives for now.  7.  Tobacco abuse-continue nicotine patch.  Likely discharge home tomorrow on Oral Eliquis  All the records are reviewed and case discussed with Care Management/Social Worker. Management plans discussed with the patient, family and they are in agreement.  CODE STATUS: Full code  DVT Prophylaxis: Heparin drip  TOTAL TIME TAKING CARE OF THIS PATIENT: 30 minutes.   POSSIBLE D/C IN 2-3 DAYS, DEPENDING ON CLINICAL CONDITION.   Henreitta Leber M.D on 01/29/2018 at 2:33 PM  Between 7am to 6pm - Pager - [phone]  After 6pm go to [email or web address] - Proofreader  [facility]  Office  [phone]  CC: Primary care physician; Irean Hong, MD

## 2018-01-30 ENCOUNTER — Inpatient Hospital Stay: Payer: Medicare Other

## 2018-01-30 DIAGNOSIS — R0602 Shortness of breath: Secondary | ICD-10-CM

## 2018-01-30 DIAGNOSIS — R079 Chest pain, unspecified: Secondary | ICD-10-CM

## 2018-01-30 LAB — TROPONIN I: Troponin I: 0.03 ng/mL (ref <0.03)

## 2018-01-30 MED ORDER — GABAPENTIN 300 MG PO CAPS: 300.0000 mg | ORAL_CAPSULE | Freq: Three times a day (TID) | ORAL | 0 refills | Status: DC

## 2018-01-30 MED ORDER — LISINOPRIL 40 MG PO TABS: 40.0000 mg | ORAL_TABLET | Freq: Every day | ORAL | 0 refills | Status: DC

## 2018-01-30 MED ORDER — METOPROLOL TARTRATE 50 MG PO TABS: 50.0000 mg | ORAL_TABLET | Freq: Two times a day (BID) | ORAL | 0 refills | Status: DC

## 2018-01-30 MED ORDER — APIXABAN 5 MG PO TABS: 5.0000 mg | ORAL_TABLET | Freq: Two times a day (BID) | ORAL | 0 refills | Status: DC

## 2018-01-30 MED ORDER — OMEPRAZOLE 20 MG PO CPDR: 20.0000 mg | DELAYED_RELEASE_CAPSULE | Freq: Every day | ORAL | 0 refills | Status: DC

## 2018-01-30 MED ORDER — SODIUM CHLORIDE 0.9 % IV SOLN
INTRAVENOUS | Status: DC
  Administered 2018-01-30: 18:00:00 via INTRAVENOUS

## 2018-01-30 MED ORDER — SODIUM CHLORIDE 0.9 % IV BOLUS
500.0000 mL | Freq: Once | INTRAVENOUS | Status: AC
  Administered 2018-01-30: 500 mL via INTRAVENOUS

## 2018-01-30 MED ORDER — ATORVASTATIN CALCIUM 80 MG PO TABS
80.0000 mg | ORAL_TABLET | Freq: Every day | ORAL | 0 refills | Status: DC
Start: 2018-01-30 — End: 2018-06-15

## 2018-01-30 MED ORDER — OXYCODONE HCL 5 MG PO TABS
5.0000 mg | ORAL_TABLET | Freq: Four times a day (QID) | ORAL | Status: DC | PRN
  Administered 2018-01-31 (×2): 5 mg via ORAL
  Filled 2018-01-30 (×2): qty 1

## 2018-01-30 MED ORDER — ONDANSETRON HCL 4 MG PO TABS: 4.0000 mg | ORAL_TABLET | Freq: Four times a day (QID) | ORAL | 0 refills | Status: DC | PRN

## 2018-01-30 NOTE — Care Management (Signed)
Patient admitted for Mesenteric ischemia.  Patient lives at home with significant other.  Patient had been seeing Dr. Carlene Coria, but states she wants to change her PCP to [facility].  Patient was set up with a new patient appointment for 01/31/18.  Patient has previously been to medication management, however she did not submit her financial information submitted for 2019, and they were no longer able to provide her medication.   Sharrie Rothman with Medication Management states they will be able to fill her medications at discharge, however she will have to submit her information to be able to continue getting medication.  Patient made aware.  All scripts were sent to [facility]  And they are in stock.  After scripts were sent MD notified RNCM that patient does not need to pick up Lisinopril due to her BP.  Notified patient, and message left for Medication Management .     Patient was notified that she must keep her appointment with [facility] in order to have a PCP to continue receiving her medication.  It is important that patient receives her Eliquis.  Patient in agreement.   Previous admission patient received charity RW from [facility], and PT provided by [facility] paid for by the [facility].  There is no indication for home PT this admission.  RNCM signing off.

## 2018-01-30 NOTE — Progress Notes (Signed)
01/30/2018 1600  Unit director spoke with daughter, who was upset about pt discharge orders.  Unit director discussed that pt is negative for C. Diff and that nursing staff should be notified of pt's bowel movements. Daughter revealed that the pt's sister has been eating meals instead of pt.  Discussed that we would hold narcotics as pt is lethargic and start IV fluids to maintain BP.  Dr. Verdell Carmine will suspend discharge and pt will have another attending MD tomorrow.  Dola Argyle, RN

## 2018-01-30 NOTE — Discharge Summary (Signed)
[facility] at [place] NAME: Carla [name]    MR#:  [id number]  DATE OF BIRTH:  02-18-1961  DATE OF ADMISSION:  01/24/2018 ADMITTING PHYSICIAN: Lance Coon, MD  DATE OF DISCHARGE: 01/30/2018  PRIMARY CARE PHYSICIAN: Irean Hong, MD    ADMISSION DIAGNOSIS:  Mesenteric ischemia ([place]) [K55.9]  DISCHARGE DIAGNOSIS:  Principal Problem:   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HCC) Active Problems:   Essential hypertension with goal blood pressure less than 140/90   Gastroesophageal reflux disease   Hyperlipidemia   Adjustment disorder with anxiety   SECONDARY DIAGNOSIS:   Past Medical History:  Diagnosis Date  . Abdominal aortic atherosclerosis ([place])    a. 05/2017 CTA abd/pelvis: significant atherosclerotic dzs of the infrarenal abd Ao w/ some mural thrombus. No aneurysm or dissection.  . Abdominal pain   . Acute focal ischemia of small intestine (HCC)   . Acute right-sided low back pain with right-sided sciatica 06/13/2017  . AKI (acute kidney injury) ([place]) 07/29/2017  . Baker's cyst of knee, right    a. 07/2016 U/S: 4.1 x 1.4 x 2.9 cystic structure in R poplitetal fossa.  . Bell's palsy   . Cancer ([place])   . Chest pain    a. 08/2012 Lexiscan MV: EF 54%, non ischemia/infarct.  . Chronic idiopathic constipation   . Chronic mesenteric ischemia ([place])   . Diastolic dysfunction    a. 05/2017 Echo: Ef 60-65%, no rwma, Gr1 DD, no source of cardiac emboli.  . 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[place])    a. 05/2017 CTA Abd/pelvis: apparent thrombus or embolus in prox SMA (70-90%); b. 05/2017 catheter directed tPA, mechanical thrombectomy, and stenting of the SMA.  . Essential hypertension with goal blood pressure less than 140/90 01/19/2016  . Gastroesophageal reflux disease 01/19/2016  . GERD (gastroesophageal reflux disease)   . Hyperlipidemia   . Hypertension   . Hypotension 07/29/2017  . Morbid obesity with BMI of 40.0-44.9, adult ([place])   .  Occlusive mesenteric ischemia ([place]) 06/19/2017  . Personal history of other malignant neoplasm of skin 06/01/2016  . Primary osteoarthritis of both knees 06/13/2017  . Recurrent major depressive disorder ([place]) 01/19/2016  . SBO (small bowel obstruction) ([place]) 08/06/2017  . 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[place])   . Urinary tract infection 01/09/2014    HOSPITAL COURSE:   57 year old female with past medical history of hypertension, hyperlipidemia, peripheral vascular disease, osteoarthritis, obesity, GERD, previous history of superior mesenteric thrombosis who presents to the hospital due to abdominal pain and noted to have mesenteric ischemia.  1.  Mesenteric ischemia-patient presented to the hospital due to worsening abdominal pain and CT scan findings were suggestive of SMA stent restenosis.   -Status post mesenteric angiogram with thrombolysis to the SMA with stent placement on 01/29/18.  -Initially patient was on IV heparin drip but now has been switched over to oral Eliquis.  Patient continues to complain of abdominal pain which is chronic for her.  Her diet has been advanced and she is tolerating a regular diet well.  Patient has high drug-seeking behavior therefore she is not being discharged on any pain medications. -She will continue Eliquis and follow-up with vascular surgery as an outpatient.  2.  Essential hypertension- patient's blood pressure is somewhat on the low side.  We will discontinue her lisinopril for now.  Continue metoprolol. -Further changes to her meds can be done as an outpatient.  3.  Hyperlipidemia- She will continue atorvastatin.  4.  Neuropathy-  she will continue gabapentin.  5. Diarrhea - now resolved and. Stool for C. Diff & Comp culture was (-). Cont. Supportive care with Imodium as needed as outpatient.   6.  Tobacco abuse- while in the hospital pt. Was on nicotine patch and has been strongly advised to quit smoking.     DISCHARGE CONDITIONS:    Stable.   CONSULTS OBTAINED:  Treatment Team:  Katha Cabal, MD  DRUG ALLERGIES:  No Known Allergies  DISCHARGE MEDICATIONS:   Allergies as of 01/30/2018   No Known Allergies     Medication List    STOP taking these medications   ibuprofen 600 MG tablet Commonly known as:  ADVIL,MOTRIN   lisinopril 40 MG tablet Commonly known as:  PRINIVIL,ZESTRIL     TAKE these medications   acetaminophen 325 MG tablet Commonly known as:  TYLENOL Take 2 tablets (650 mg total) by mouth every 6 (six) hours as needed for mild pain (or Fever >/= 101).   apixaban 5 MG Tabs tablet Commonly known as:  ELIQUIS Take 1 tablet (5 mg total) by mouth 2 (two) times daily.   aspirin EC 81 MG tablet Take 81 mg by mouth daily.   atorvastatin 80 MG tablet Commonly known as:  LIPITOR Take 1 tablet (80 mg total) by mouth at bedtime.   gabapentin 300 MG capsule Commonly known as:  NEURONTIN Take 1 capsule (300 mg total) by mouth 3 (three) times daily.   metoprolol tartrate 50 MG tablet Commonly known as:  LOPRESSOR Take 1 tablet (50 mg total) by mouth 2 (two) times daily.   omeprazole 20 MG capsule Commonly known as:  PRILOSEC Take 1 capsule (20 mg total) by mouth daily.   ondansetron 4 MG tablet Commonly known as:  ZOFRAN Take 1 tablet (4 mg total) by mouth every 6 (six) hours as needed for nausea.   senna-docusate 8.6-50 MG tablet Commonly known as:  Senokot-S Take 2 tablets by mouth at bedtime as needed for mild constipation. What changed:  when to take this         DISCHARGE INSTRUCTIONS:   DIET:  Cardiac diet  DISCHARGE CONDITION:  Stable  ACTIVITY:  Activity as tolerated  OXYGEN:  Home Oxygen: No.   Oxygen Delivery: room air  DISCHARGE LOCATION:  home   If you experience worsening of your admission symptoms, develop shortness of breath, life threatening emergency, suicidal or homicidal thoughts you must seek medical attention immediately by calling 911  or calling your MD immediately  if symptoms less severe.  You Must read complete instructions/literature along with all the possible adverse reactions/side effects for all the Medicines you take and that have been prescribed to you. Take any new Medicines after you have completely understood and accpet all the possible adverse reactions/side effects.   Please note  You were cared for by a hospitalist during your hospital stay. If you have any questions about your discharge medications or the care you received while you were in the hospital after you are discharged, you can call the unit and asked to speak with the hospitalist on call if the hospitalist that took care of you is not available. Once you are discharged, your primary care physician will handle any further medical issues. Please note that NO REFILLS for any discharge medications will be authorized once you are discharged, as it is imperative that you return to your primary care physician (or establish a relationship with a primary care physician if you do not  have one) for your aftercare needs so that they can reassess your need for medications and monitor your lab values.     Today   Patient still complaining of some vague abdominal pain.  Patient has chronic pain issues.  She also complains of some dizziness.  Patient is tolerating p.o. well and no observed nausea vomiting noted by nursing staff.  Will discharge home today on oral Eliquis and follow-up with vascular surgery.  VITAL SIGNS:  Blood pressure 108/70, pulse (!) 108, temperature 98.3 F (36.8 C), temperature source Oral, resp. rate 16, height 5\' 3"  (1.6 m), weight 101.8 kg (224 lb 8 oz), SpO2 100 %.  I/O:    Intake/Output Summary (Last 24 hours) at 01/30/2018 1320 Last data filed at 01/30/2018 1103 Gross per 24 hour  Intake 480 ml  Output -  Net 480 ml    PHYSICAL EXAMINATION:   GENERAL:  57 y.o.-year-old patient lying in bed complaining of abdominal pain which is  chronic for the patient.   EYES: Pupils equal, round, reactive to light and accommodation. No scleral icterus. Extraocular muscles intact.  HEENT: Head atraumatic, normocephalic. Oropharynx and nasopharynx clear.  NECK:  Supple, no jugular venous distention. No thyroid enlargement, no tenderness.  LUNGS: Normal breath sounds bilaterally, no wheezing, rales, rhonchi. No use of accessory muscles of respiration.  CARDIOVASCULAR: S1, S2 normal. No murmurs, rubs, or gallops.  ABDOMEN: Soft, Slightly tender in upper abdomen with some voluntary guarding. No rebound, rigidity, nondistended. Bowel sounds present. No organomegaly or mass.  EXTREMITIES: No cyanosis, clubbing, +1-2 edema b/l    NEUROLOGIC: Cranial nerves II through XII are intact. No focal Motor or sensory deficits b/l.   PSYCHIATRIC: The patient is alert and oriented x 3.  SKIN: No obvious rash, lesion, or ulcer.   DATA REVIEW:   CBC Recent Labs  Lab 01/28/18 0515  WBC 5.5  HGB 10.8*  HCT 33.2*  PLT 234    Chemistries  Recent Labs  Lab 01/24/18 1808  01/28/18 0515  NA 138   < > 140  K 3.4*   < > 3.9  CL 107   < > 111  CO2 22   < > 22  GLUCOSE 109*   < > 112*  BUN 9   < > 11  CREATININE 0.98   < > 1.03*  CALCIUM 8.9   < > 8.6*  MG  --   --  2.0  AST 17  --   --   ALT 12*  --   --   ALKPHOS 91  --   --   BILITOT 0.4  --   --    < > = values in this interval not displayed.    Cardiac Enzymes No results for input(s): TROPONINI in the last 168 hours.  Microbiology Results  Results for orders placed or performed during the hospital encounter of 01/24/18  C difficile quick scan w PCR reflex     Status: None   Collection Time: 01/27/18  1:05 PM  Result Value Ref Range Status   C Diff antigen NEGATIVE NEGATIVE Final   C Diff toxin NEGATIVE NEGATIVE Final   C Diff interpretation No C. difficile detected.  Final    Comment: Performed at [facility] [address] [place], [place] [address]   Gastrointestinal Panel by PCR , Stool     Status: None   Collection Time: 01/27/18  1:06 PM  Result Value Ref Range Status   Campylobacter species NOT  DETECTED NOT DETECTED Final   Plesimonas shigelloides NOT DETECTED NOT DETECTED Final   Salmonella species NOT DETECTED NOT DETECTED Final   Yersinia enterocolitica NOT DETECTED NOT DETECTED Final   Vibrio species NOT DETECTED NOT DETECTED Final   Vibrio cholerae NOT DETECTED NOT DETECTED Final   Enteroaggregative E coli (EAEC) NOT DETECTED NOT DETECTED Final   Enteropathogenic E coli (EPEC) NOT DETECTED NOT DETECTED Final   Enterotoxigenic E coli (ETEC) NOT DETECTED NOT DETECTED Final   Shiga like toxin producing E coli (STEC) NOT DETECTED NOT DETECTED Final   Shigella/Enteroinvasive E coli (EIEC) NOT DETECTED NOT DETECTED Final   Cryptosporidium NOT DETECTED NOT DETECTED Final   Cyclospora cayetanensis NOT DETECTED NOT DETECTED Final   Entamoeba histolytica NOT DETECTED NOT DETECTED Final   Giardia lamblia NOT DETECTED NOT DETECTED Final   Adenovirus F40/41 NOT DETECTED NOT DETECTED Final   Astrovirus NOT DETECTED NOT DETECTED Final   Norovirus GI/GII NOT DETECTED NOT DETECTED Final   Rotavirus A NOT DETECTED NOT DETECTED Final   Sapovirus (I, II, IV, and V) NOT DETECTED NOT DETECTED Final    Comment: Performed at [facility], [address] [place], [place] [address]  MRSA PCR Screening     Status: None   Collection Time: 01/27/18  3:30 PM  Result Value Ref Range Status   MRSA by PCR NEGATIVE NEGATIVE Final    Comment:        The GeneXpert MRSA Assay (FDA approved for NASAL specimens only), is one component of a comprehensive MRSA colonization surveillance program. It is not intended to diagnose MRSA infection nor to guide or monitor treatment for MRSA infections. Performed at [facility], [address] [place],  [address]     RADIOLOGY:  No results found.    Management plans discussed  with the patient, family and they are in agreement.  CODE STATUS:     Code Status Orders  (From admission, onward)        Start     Ordered   01/24/18 2306  Full code  Continuous     01/24/18 2305      TOTAL TIME TAKING CARE OF THIS PATIENT: 40 minutes.    Henreitta Leber M.D on 01/30/2018 at 1:20 PM  Between 7am to 6pm - Pager - [phone]  After 6pm go to [email or web address] - Proofreader  [facility]  Office  [phone]  CC: Primary care physician; Irean Hong, MD

## 2018-01-30 NOTE — Progress Notes (Signed)
[place] [facility] [place] NAME: Carla [name]    MR#:  [id number]  DATE OF BIRTH:  02-23-1961  SUBJECTIVE:   Patient is status post mesenteric angiogram yesterday and TPA to the SMA with mechanical thrombectomy to the SMA and stent placement to the distal SMA.  She continues to complain of vague abdominal pain.  Eating, tolerating p.o. well.  REVIEW OF SYSTEMS:    Review of Systems  Constitutional: Negative for chills and fever.  HENT: Negative for congestion and tinnitus.   Eyes: Negative for blurred vision and double vision.  Respiratory: Negative for cough, shortness of breath and wheezing.   Cardiovascular: Negative for chest pain, orthopnea and PND.  Gastrointestinal: Positive for abdominal pain. Negative for diarrhea, nausea and vomiting.  Genitourinary: Negative for dysuria and hematuria.  Neurological: Negative for dizziness, sensory change and focal weakness.  All other systems reviewed and are negative.   Nutrition: heart Healthy/Carb control Tolerating Diet: yes Tolerating PT: Ambulatory  DRUG ALLERGIES:  No Known Allergies  VITALS:  Blood pressure 103/63, pulse 91, temperature 98.6 F (37 C), temperature source Oral, resp. rate 18, height 5\' 3"  (1.6 m), weight 101.8 kg (224 lb 8 oz), SpO2 97 %.  PHYSICAL EXAMINATION:   Physical Exam  GENERAL:  57 y.o.-year-old patient lying in bed in mild GI distress.  EYES: Pupils equal, round, reactive to light and accommodation. No scleral icterus. Extraocular muscles intact.  HEENT: Head atraumatic, normocephalic. Oropharynx and nasopharynx clear.  NECK:  Supple, no jugular venous distention. No thyroid enlargement, no tenderness.  LUNGS: Normal breath sounds bilaterally, no wheezing, rales, rhonchi. No use of accessory muscles of respiration.  CARDIOVASCULAR: S1, S2 normal. No murmurs, rubs, or gallops.  ABDOMEN: Soft, Tender in upper abdomen but no rebound, rigidity, nondistended. Bowel  sounds present. No organomegaly or mass.  EXTREMITIES: No cyanosis, clubbing, +1-2 edema b/l    NEUROLOGIC: Cranial nerves II through XII are intact. No focal Motor or sensory deficits b/l.  Globally weak.  PSYCHIATRIC: The patient is alert and oriented x 3.  SKIN: No obvious rash, lesion, or ulcer.    LABORATORY PANEL:   CBC Recent Labs  Lab 01/28/18 0515  WBC 5.5  HGB 10.8*  HCT 33.2*  PLT 234   ------------------------------------------------------------------------------------------------------------------  Chemistries  Recent Labs  Lab 01/24/18 1808  01/28/18 0515  NA 138   < > 140  K 3.4*   < > 3.9  CL 107   < > 111  CO2 22   < > 22  GLUCOSE 109*   < > 112*  BUN 9   < > 11  CREATININE 0.98   < > 1.03*  CALCIUM 8.9   < > 8.6*  MG  --   --  2.0  AST 17  --   --   ALT 12*  --   --   ALKPHOS 91  --   --   BILITOT 0.4  --   --    < > = values in this interval not displayed.   ------------------------------------------------------------------------------------------------------------------  Cardiac Enzymes No results for input(s): TROPONINI in the last 168 hours. ------------------------------------------------------------------------------------------------------------------  RADIOLOGY:  No results found.   ASSESSMENT AND PLAN:   57 year old female with past medical history of hypertension, hyperlipidemia, peripheral vascular disease, osteoarthritis, obesity, GERD, previous history of superior mesenteric thrombosis who presents to the hospital due to abdominal pain and noted to have mesenteric ischemia.  1.  Mesenteric ischemia-patient presented to the  hospital due to worsening abdominal pain and CT scan findings were suggestive of SMA stent restenosis.   -Status post mesenteric angiogram with thrombolysis to the SMA with stent placement yesterday. Patient still having some vague abdominal pain but she has had chronic abdominal pain.   - cont. Oxycodone for  pain and cont. Eliquis. Further care as per Vascular surgery.   2.  Essential hypertension-continue Metoprolol and will d/c Lisinopril as pt's BP on the low side.    3.  Hyperlipidemia- pt. Will continue atorvastatin.  4.  Anxiety- pt. Will continue Xanax.  5.  Neuropathy- pt. Will continue gabapentin.  6. Diarrhea -still having some diarrhea but it is not witnessed by nursing staff.  Continue to hold laxatives for now.  7.  Tobacco abuse-continue nicotine patch.  Attempted discharge patient today but patient's daughter quite argumentative and upset about her discharge.  Patient is medically stable to be discharged.  Discussed with vascular surgery and she has chronic pain issues and she was going to be discharged on oral Eliquis.  Daughter is concerned about her blood pressure being low and her blood pressure meds have been adjusted.  She is not orthostatic.   All the records are reviewed and case discussed with Care Management/Social Worker. Management plans discussed with the patient, family and they are in agreement.  CODE STATUS: Full code  DVT Prophylaxis: Heparin drip  TOTAL TIME TAKING CARE OF THIS PATIENT: 35 minutes.   POSSIBLE D/C IN 1-2 DAYS, DEPENDING ON CLINICAL CONDITION.   Henreitta Leber M.D on 01/30/2018 at 3:15 PM  Between 7am to 6pm - Pager - [phone]  After 6pm go to [email or web address] - Proofreader  [facility]  Office  [phone]  CC: Primary care physician; Irean Hong, MD

## 2018-01-31 DIAGNOSIS — K559 Vascular disorder of intestine, unspecified: Secondary | ICD-10-CM

## 2018-01-31 LAB — CBC
HCT: 29.6 % — ABNORMAL LOW (ref 35.0–47.0)
Hemoglobin: 9.8 g/dL — ABNORMAL LOW (ref 12.0–16.0)
MCH: 27.2 pg (ref 26.0–34.0)
MCHC: 33.1 g/dL (ref 32.0–36.0)
MCV: 82.1 fL (ref 80.0–100.0)
Platelets: 242 10*3/uL (ref 150–440)
RBC: 3.6 MIL/uL — ABNORMAL LOW (ref 3.80–5.20)
RDW: 17.1 % — ABNORMAL HIGH (ref 11.5–14.5)
WBC: 12.6 10*3/uL — ABNORMAL HIGH (ref 3.6–11.0)

## 2018-01-31 LAB — BASIC METABOLIC PANEL
Anion gap: 6 (ref 5–15)
BUN: 14 mg/dL (ref 6–20)
CO2: 27 mmol/L (ref 22–32)
Calcium: 8.2 mg/dL — ABNORMAL LOW (ref 8.9–10.3)
Chloride: 103 mmol/L (ref 101–111)
Creatinine, Ser: 1.04 mg/dL — ABNORMAL HIGH (ref 0.44–1.00)
GFR calc Af Amer: >60 mL/min (ref >60)
GFR calc non Af Amer: 58 mL/min — ABNORMAL LOW (ref >60)
Glucose, Bld: 114 mg/dL — ABNORMAL HIGH (ref 65–99)
Potassium: 3.2 mmol/L — ABNORMAL LOW (ref 3.5–5.1)
Sodium: 136 mmol/L (ref 135–145)

## 2018-01-31 LAB — LACTIC ACID, PLASMA: Lactic Acid, Venous: 1.4 mmol/L (ref 0.5–1.9)

## 2018-01-31 LAB — MAGNESIUM: Magnesium: 2.3 mg/dL (ref 1.7–2.4)

## 2018-01-31 MED ORDER — POTASSIUM CHLORIDE CRYS ER 20 MEQ PO TBCR
40.0000 meq | EXTENDED_RELEASE_TABLET | Freq: Once | ORAL | Status: AC
  Administered 2018-01-31: 40 meq via ORAL
  Filled 2018-01-31: qty 2

## 2018-01-31 MED ORDER — SODIUM CHLORIDE 0.9 % IV BOLUS
1000.0000 mL | Freq: Once | INTRAVENOUS | Status: AC
  Administered 2018-01-31: 1000 mL via INTRAVENOUS

## 2018-01-31 MED ORDER — POTASSIUM CHLORIDE CRYS ER 20 MEQ PO TBCR: 40.0000 meq | EXTENDED_RELEASE_TABLET | Freq: Two times a day (BID) | ORAL | Status: DC

## 2018-01-31 MED ORDER — DOCUSATE SODIUM 100 MG PO CAPS: 100.0000 mg | ORAL_CAPSULE | Freq: Every day | ORAL | Status: DC | PRN

## 2018-01-31 MED ORDER — OXYCODONE HCL 5 MG PO TABS: 5.0000 mg | ORAL_TABLET | Freq: Four times a day (QID) | ORAL | 0 refills | Status: DC | PRN

## 2018-01-31 MED ORDER — ENSURE ENLIVE PO LIQD: 237.0000 mL | Freq: Two times a day (BID) | ORAL | 12 refills | Status: DC

## 2018-01-31 NOTE — Discharge Summary (Addendum)
[place] at [place] NAME: Carla [name]    MR#:  [id number]  DATE OF BIRTH:  03-27-1961  DATE OF ADMISSION:  01/24/2018 ADMITTING PHYSICIAN: Lance Coon, MD  DATE OF DISCHARGE: 01/31/2018  PRIMARY CARE PHYSICIAN: Irean Hong, MD    ADMISSION DIAGNOSIS:  Mesenteric ischemia ([place]) [K55.9]  DISCHARGE DIAGNOSIS:  Principal Problem:   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HCC) Active Problems:   Essential hypertension with goal blood pressure less than 140/90   Gastroesophageal reflux disease   Hyperlipidemia   Adjustment disorder with anxiety   SECONDARY DIAGNOSIS:   Past Medical History:  Diagnosis Date  . Abdominal aortic atherosclerosis ([place])    a. 05/2017 CTA abd/pelvis: significant atherosclerotic dzs of the infrarenal abd Ao w/ some mural thrombus. No aneurysm or dissection.  . Abdominal pain   . Acute focal ischemia of small intestine (HCC)   . Acute right-sided low back pain with right-sided sciatica 06/13/2017  . AKI (acute kidney injury) ([place]) 07/29/2017  . Baker's cyst of knee, right    a. 07/2016 U/S: 4.1 x 1.4 x 2.9 cystic structure in R poplitetal fossa.  . Bell's palsy   . Cancer ([place])   . Chest pain    a. 08/2012 Lexiscan MV: EF 54%, non ischemia/infarct.  . Chronic idiopathic constipation   . Chronic mesenteric ischemia ([place])   . Diastolic dysfunction    a. 05/2017 Echo: Ef 60-65%, no rwma, Gr1 DD, no source of cardiac emboli.  . 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[place])    a. 05/2017 CTA Abd/pelvis: apparent thrombus or embolus in prox SMA (70-90%); b. 05/2017 catheter directed tPA, mechanical thrombectomy, and stenting of the SMA.  . Essential hypertension with goal blood pressure less than 140/90 01/19/2016  . Gastroesophageal reflux disease 01/19/2016  . GERD (gastroesophageal reflux disease)   . Hyperlipidemia   . Hypertension   . Hypotension 07/29/2017  . Morbid obesity with BMI of 40.0-44.9, adult ([place])   .  Occlusive mesenteric ischemia ([place]) 06/19/2017  . Personal history of other malignant neoplasm of skin 06/01/2016  . Primary osteoarthritis of both knees 06/13/2017  . Recurrent major depressive disorder ([place]) 01/19/2016  . SBO (small bowel obstruction) ([place]) 08/06/2017  . 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[place])   . Urinary tract infection 01/09/2014    HOSPITAL COURSE:   57 year old female with past medical history of hypertension, hyperlipidemia, peripheral vascular disease, osteoarthritis, obesity, GERD, previous history of superior mesenteric thrombosis who presents to the hospital due to abdominal pain and noted to have mesenteric ischemia.  1. Mesenteric ischemia: She presented to the hospital due to worsening abdominal pain and CT scan findings were suggestive of SMA stent restenosis. She is status post mesenteric angiogram with thrombolysis to the SMA with stent placement on 01/29/18.  Initially patient was on IV heparin drip but now has been switched over to Eliquis. She has chronic abdominal pain. I asked Dr Lucky Cowboy to reevaluate her due to abdominal pain.  She is stable to discharge from a vascular point and will follow up with them in 2 weeks. Despite her stent being occluded, she had excellent collateral blood flow  Her diet has been advanced and she is tolerating a regular diet well.   White count was mildly elevated after the procedure which is not uncommon.    2. Essential hypertension: her blood pressure is somewhat on the low side.  We will discontinue her blood pressure medications and she will have outpatient follow up.  3. Hyperlipidemia:She will continue atorvastatin.  4. Neuropathy: She will continue gabapentin.  5. Diarrhea: This has resolved.Stool for C. Diff & Comp culture was (-). Cont. Supportive care with Imodium as needed as outpatient.   6. Tobacco dependence: Patient is encouraged to quit smoking. Counseling was provided for 4 minutes.   7.  Hypokalemia: This was repleted.   DISCHARGE CONDITIONS AND DIET:   Stable   Heart healthy diet  CONSULTS OBTAINED:  Treatment Team:  Katha Cabal, MD  DRUG ALLERGIES:  No Known Allergies  DISCHARGE MEDICATIONS:   Allergies as of 01/31/2018   No Known Allergies     Medication List    STOP taking these medications   ibuprofen 600 MG tablet Commonly known as:  ADVIL,MOTRIN   lisinopril 40 MG tablet Commonly known as:  PRINIVIL,ZESTRIL   metoprolol tartrate 50 MG tablet Commonly known as:  LOPRESSOR     TAKE these medications   acetaminophen 325 MG tablet Commonly known as:  TYLENOL Take 2 tablets (650 mg total) by mouth every 6 (six) hours as needed for mild pain (or Fever >/= 101).   apixaban 5 MG Tabs tablet Commonly known as:  ELIQUIS Take 1 tablet (5 mg total) by mouth 2 (two) times daily.   aspirin EC 81 MG tablet Take 81 mg by mouth daily.   atorvastatin 80 MG tablet Commonly known as:  LIPITOR Take 1 tablet (80 mg total) by mouth at bedtime.   feeding supplement (ENSURE ENLIVE) Liqd Take 237 mLs by mouth 2 (two) times daily between meals.   gabapentin 300 MG capsule Commonly known as:  NEURONTIN Take 1 capsule (300 mg total) by mouth 3 (three) times daily.   omeprazole 20 MG capsule Commonly known as:  PRILOSEC Take 1 capsule (20 mg total) by mouth daily.   ondansetron 4 MG tablet Commonly known as:  ZOFRAN Take 1 tablet (4 mg total) by mouth every 6 (six) hours as needed for nausea.   oxyCODONE 5 MG immediate release tablet Commonly known as:  Oxy IR/ROXICODONE Take 1 tablet (5 mg total) by mouth every 6 (six) hours as needed for moderate pain or severe pain.   senna-docusate 8.6-50 MG tablet Commonly known as:  Senokot-S Take 2 tablets by mouth at bedtime as needed for mild constipation. What changed:  when to take this         Today   CHIEF COMPLAINT:  Patient c/o abdominal pain seen by vascular this am   VITAL SIGNS:   Blood pressure (!) 100/56, pulse 91, temperature 99 F (37.2 C), temperature source Oral, resp. rate 17, height 5\' 3"  (1.6 m), weight 101.8 kg (224 lb 8 oz), SpO2 97 %.   REVIEW OF SYSTEMS:  Review of Systems  Constitutional: Negative.  Negative for chills, fever and malaise/fatigue.  HENT: Negative.  Negative for ear discharge, ear pain, hearing loss, nosebleeds and sore throat.   Eyes: Negative.  Negative for blurred vision and pain.  Respiratory: Negative.  Negative for cough, hemoptysis, shortness of breath and wheezing.   Cardiovascular: Negative.  Negative for chest pain, palpitations and leg swelling.  Gastrointestinal: Positive for abdominal pain. Negative for blood in stool, diarrhea, nausea and vomiting.  Genitourinary: Negative.  Negative for dysuria.  Musculoskeletal: Negative.  Negative for back pain.  Skin: Negative.   Neurological: Negative for dizziness, tremors, speech change, focal weakness, seizures and headaches.  Endo/Heme/Allergies: Negative.  Does not bruise/bleed easily.  Psychiatric/Behavioral: Negative.  Negative for depression, hallucinations and suicidal  ideas.     PHYSICAL EXAMINATION:  GENERAL:  57 y.o.-year-old patient lying in the bed with no acute distress.  NECK:  Supple, no jugular venous distention. No thyroid enlargement, no tenderness.  LUNGS: Normal breath sounds bilaterally, no wheezing, rales,rhonchi  No use of accessory muscles of respiration.  CARDIOVASCULAR: S1, S2 normal. No murmurs, rubs, or gallops.  ABDOMEN: Soft, non-tender, non-distended. Bowel sounds present. No organomegaly or mass.  EXTREMITIES: No pedal edema, cyanosis, or clubbing.  PSYCHIATRIC: The patient is alert and oriented x 3.  SKIN: No obvious rash, lesion, or ulcer.   DATA REVIEW:   CBC Recent Labs  Lab 01/31/18 0452  WBC 12.6*  HGB 9.8*  HCT 29.6*  PLT 242    Chemistries  Recent Labs  Lab 01/24/18 1808  01/31/18 0452  NA 138   < > 136  K 3.4*   < >  3.2*  CL 107   < > 103  CO2 22   < > 27  GLUCOSE 109*   < > 114*  BUN 9   < > 14  CREATININE 0.98   < > 1.04*  CALCIUM 8.9   < > 8.2*  MG  --    < > 2.3  AST 17  --   --   ALT 12*  --   --   ALKPHOS 91  --   --   BILITOT 0.4  --   --    < > = values in this interval not displayed.    Cardiac Enzymes Recent Labs  Lab 01/30/18 1709  TROPONINI 0.03*    Microbiology Results  @MICRORSLT48 @  RADIOLOGY:  Dg Abd 1 View  Result Date: 01/30/2018 CLINICAL DATA:  Pt reports stomach pain and inability to use the bathroom. Pt says last bowel movement was this morning where she produced "very little" and it was soft. EXAM: ABDOMEN - 1 VIEW COMPARISON:  01/26/2018 FINDINGS: Normal bowel gas pattern. There has been a decrease in the colonic stool burden since the prior study. No significant increase colonic stool Surgical clips are upper quadrant reflect prior cholecystectomy. Single metal clip noted in the right pelvis, both findings stable. Soft tissues are otherwise unremarkable. No acute skeletal abnormality. IMPRESSION: 1. No acute findings.  No bowel obstruction. 2. Significant decrease stool when compared to the prior study. No other change. Electronically Signed   By: Lajean Manes M.D.   On: 01/30/2018 19:33      Allergies as of 01/31/2018   No Known Allergies     Medication List    STOP taking these medications   ibuprofen 600 MG tablet Commonly known as:  ADVIL,MOTRIN   lisinopril 40 MG tablet Commonly known as:  PRINIVIL,ZESTRIL   metoprolol tartrate 50 MG tablet Commonly known as:  LOPRESSOR     TAKE these medications   acetaminophen 325 MG tablet Commonly known as:  TYLENOL Take 2 tablets (650 mg total) by mouth every 6 (six) hours as needed for mild pain (or Fever >/= 101).   apixaban 5 MG Tabs tablet Commonly known as:  ELIQUIS Take 1 tablet (5 mg total) by mouth 2 (two) times daily.   aspirin EC 81 MG tablet Take 81 mg by mouth daily.   atorvastatin 80 MG  tablet Commonly known as:  LIPITOR Take 1 tablet (80 mg total) by mouth at bedtime.   feeding supplement (ENSURE ENLIVE) Liqd Take 237 mLs by mouth 2 (two) times daily between meals.   gabapentin 300 MG  capsule Commonly known as:  NEURONTIN Take 1 capsule (300 mg total) by mouth 3 (three) times daily.   omeprazole 20 MG capsule Commonly known as:  PRILOSEC Take 1 capsule (20 mg total) by mouth daily.   ondansetron 4 MG tablet Commonly known as:  ZOFRAN Take 1 tablet (4 mg total) by mouth every 6 (six) hours as needed for nausea.   oxyCODONE 5 MG immediate release tablet Commonly known as:  Oxy IR/ROXICODONE Take 1 tablet (5 mg total) by mouth every 6 (six) hours as needed for moderate pain or severe pain.   senna-docusate 8.6-50 MG tablet Commonly known as:  Senokot-S Take 2 tablets by mouth at bedtime as needed for mild constipation. What changed:  when to take this          Management plans discussed with the patient and she is in agreement. Stable for discharge home  Patient should follow up with pcp  CODE STATUS:     Code Status Orders  (From admission, onward)        Start     Ordered   01/24/18 2306  Full code  Continuous     01/24/18 2305    Code Status History    Date Active Date Inactive Code Status Order ID Comments User Context   08/06/2017 2022 08/17/2017 2158 Full Code [id number]  Florene Glen, MD ED   07/29/2017 1600 08/02/2017 1814 Full Code [id number]  Gorden Harms, MD Inpatient   06/19/2017 2019 06/28/2017 1638 Full Code [id number]  Vickie Epley, MD ED      TOTAL TIME TAKING CARE OF THIS PATIENT: 85minutes.    Note: This dictation was prepared with Dragon dictation along with smaller phrase technology. Any transcriptional errors that result from this process are unintentional.  Rian Busche M.D on 01/31/2018 at 12:36 PM  Between 7am to 6pm - Pager - [phone] After 6pm go to [email or web address] - password [email or web address] [facility]  Office  [phone]  CC: Primary care physician; Irean Hong, MD

## 2018-01-31 NOTE — Progress Notes (Signed)
Pt discharged per MD order. IV removed. Discharge instructions reviewed with pt. Prescriptions reviewed with pt and pt verbalized understanding that prescriptions that had been sent to medication management but they are unable to fill her pain medication. All questions answered to pt satisfaction. Pt taken downstairs in wheelchair by staff with all of her belongings.

## 2018-01-31 NOTE — Evaluation (Signed)
Physical Therapy Evaluation Patient Details Name: Carla [name] MRN: [id number] DOB: 08/02/1961 Today's Date: 01/31/2018   History of Present Illness  Pt admitted for superior mesenteric artery thrombosis with angiogram with thrombectomy with stent placement on 01/28/18. History includes HTN, PVD, OA, obesity, and GERD. pt still complains of abdominal pain this date. Orthostatics negative at this time  Clinical Impression  Pt is a pleasant 57 year old female who was admitted for superior mesenteric artery thrombosis with angiogram with thrombectomy. Pt still with main complaint of pain at this time. Pt performs bed mobility with independence, transfers with mod I, and ambulation with supervision using IV pole. RW in room and pt encouraged to use it for balance as she needs B UE support. Pt demonstrates deficits with strength/pain/balance. Per pt, she demonstrates all bed mobility/transfers/ambulation at baseline level. Pt does not want any further PT needs at this time including HHPT. Discussed with pt to remain active and ambulatory with RN staff during acute hospitalization to prevent muscle weakness. Pt will be dc in house and does not require follow up. RN aware. Will dc current orders.      Follow Up Recommendations Home health PT(however pt refusing any further PT services)    Equipment Recommendations  None recommended by PT    Recommendations for Other Services       Precautions / Restrictions Precautions Precautions: Fall Restrictions Weight Bearing Restrictions: No      Mobility  Bed Mobility Overal bed mobility: Independent             General bed mobility comments: safe technique performed, slightly impulsive and needs cues to wait prior to getting up  Transfers Overall transfer level: Modified independent Equipment used: (IV pole)             General transfer comment: uses hands to push off of bed. Once standind, forward flexed posture  noted  Ambulation/Gait Ambulation/Gait assistance: Supervision Ambulation Distance (Feet): 30 Feet Assistive device: (IV pole) Gait Pattern/deviations: Step-to pattern     General Gait Details: slow antalgic gait, however no LOB noted. Uses IV pole for balance. Able to ambulate in room, however refuses to ambulate in hall due to abdominal pain, requesting to return back to bed.  Stairs            Wheelchair Mobility    Modified Rankin (Stroke Patients Only)       Balance Overall balance assessment: Needs assistance Sitting-balance support: Feet supported Sitting balance-[name]: Normal     Standing balance support: Bilateral upper extremity supported Standing balance-[name]: Good                               Pertinent Vitals/Pain Pain Assessment: Faces Faces Pain Scale: Hurts even more Pain Location: abdomen Pain Descriptors / Indicators: Operative site guarding Pain Intervention(s): Limited activity within patient's tolerance    Home Living Family/patient expects to be discharged to:: Private residence Living Arrangements: Spouse/significant other Available Help at Discharge: Family Type of Home: House Home Access: Level entry     Home Layout: One level Home Equipment: Environmental consultant - 2 wheels      Prior Function Level of Independence: Independent with assistive device(s)         Comments: reports she uses RW at home, however not 24/7     Hand Dominance        Extremity/Trunk Assessment   Upper Extremity Assessment Upper Extremity Assessment: Overall  WFL for tasks assessed    Lower Extremity Assessment Lower Extremity Assessment: Generalized weakness(B LE grossly 4/5)       Communication   Communication: No difficulties  Cognition Arousal/Alertness: Awake/alert Behavior During Therapy: WFL for tasks assessed/performed Overall Cognitive Status: Within Functional Limits for tasks assessed                                         General Comments      Exercises Other Exercises Other Exercises: Further ambulation performed to bathroom with IV pole for assistance. Forward flexed posture due to stomach pain. Pt needs set up for hygiene, however observed good balance while performing cleaning and pt able to stand at sink for washing hands.    Assessment/Plan    PT Assessment All further PT needs can be met in the next venue of care  PT Problem List Decreased strength;Decreased activity tolerance;Pain       PT Treatment Interventions      PT Goals (Current goals can be found in the Care Plan section)  Acute Rehab PT Goals Patient Stated Goal: to go home PT Goal Formulation: All assessment and education complete, DC therapy Time For Goal Achievement: 01/31/18 Potential to Achieve Goals: Good    Frequency     Barriers to discharge        Co-evaluation               AM-PAC PT "6 Clicks" Daily Activity  Outcome Measure Difficulty turning over in bed (including adjusting bedclothes, sheets and blankets)?: None Difficulty moving from lying on back to sitting on the side of the bed? : None Difficulty sitting down on and standing up from a chair with arms (e.g., wheelchair, bedside commode, etc,.)?: None Help needed moving to and from a bed to chair (including a wheelchair)?: None Help needed walking in hospital room?: A Little Help needed climbing 3-5 steps with a railing? : A Little 6 Click Score: 22    End of Session   Activity Tolerance: Patient tolerated treatment well Patient left: in bed(refuses to sit in recliner, refuses bed alarm) Nurse Communication: Mobility status PT Visit Diagnosis: Muscle weakness (generalized) (M62.81);Difficulty in walking, not elsewhere classified (R26.2);Pain Pain - Right/Left: (abdomen) Pain - part of body: (abdomen)    Time: 2330-0762 PT Time Calculation (min) (ACUTE ONLY): 20 min   Charges:   PT Evaluation $PT Eval Low  Complexity: 1 Low PT Treatments $Therapeutic Activity: 8-22 mins   PT G CodesGreggory Stallion, PT, DPT [phone]   Schyler Butikofer 01/31/2018, 3:42 PM

## 2018-01-31 NOTE — Progress Notes (Signed)
[facility]  Daily Progress Note   Subjective  - 3 Days Post-Op  Patient still complains of abdominal pain.  When I walked in the room, she was drinking her regular Dr. Malachi Bonds and had a bag of fast food on the table so she seems to be tolerating food well.  No fevers.  Lactate was normal.  White count was mildly elevated after the procedure which is not uncommon.  Objective Vitals:   01/30/18 2230 01/31/18 0505 01/31/18 0953 01/31/18 1203  BP: 118/63 119/62 (!) 107/59 (!) 100/56  Pulse: 76 91 96 91  Resp:  18  17  Temp:  98.5 F (36.9 C)  99 F (37.2 C)  TempSrc:    Oral  SpO2:  95%  97%  Weight:      Height:        Intake/Output Summary (Last 24 hours) at 01/31/2018 1206 Last data filed at 01/31/2018 0515 Gross per 24 hour  Intake 740 ml  Output 200 ml  Net 540 ml    PULM  CTAB CV  RRR VASC  access site is clean, dry, and intact  Laboratory CBC    Component Value Date/Time   WBC 12.6 (H) 01/31/2018 0452   HGB 9.8 (L) 01/31/2018 0452   HGB 13.4 02/12/2015 1137   HCT 29.6 (L) 01/31/2018 0452   HCT 40.9 02/12/2015 1137   PLT 242 01/31/2018 0452   PLT 272 02/12/2015 1137    BMET    Component Value Date/Time   NA 136 01/31/2018 0452   NA 140 02/12/2015 1137   K 3.2 (L) 01/31/2018 0452   K 2.9 (L) 02/12/2015 1137   CL 103 01/31/2018 0452   CL 107 02/12/2015 1137   CO2 27 01/31/2018 0452   CO2 27 02/12/2015 1137   GLUCOSE 114 (H) 01/31/2018 0452   GLUCOSE 120 (H) 02/12/2015 1137   BUN 14 01/31/2018 0452   BUN 15 02/12/2015 1137   CREATININE 1.04 (H) 01/31/2018 0452   CREATININE 0.78 02/12/2015 1137   CALCIUM 8.2 (L) 01/31/2018 0452   CALCIUM 9.0 02/12/2015 1137   GFRNONAA 58 (L) 01/31/2018 0452   GFRNONAA >60 02/12/2015 1137   GFRAA >60 01/31/2018 0452   GFRAA >60 02/12/2015 1137    Assessment/Planning: POD #3 s/p SMA thrombectomy and angioplasty   At this point, there is nothing further to be done from a vascular point of  view.  Patient should be on anticoagulation.  Patient should stop smoking.  Patient should increase her activity and eat a heart healthy diet  She is stable from discharge from a vascular point of view and can return in our office in several week  It has been discussed with the patient if she continues smoking and does not take her medications her SMA stent will not remain patent.  Despite her stent being occluded, she had excellent collateral blood flow even prior to the procedure and I am not sure that ischemia has anything to do with her pain at this point since she still complains of pain.    [name]  01/31/2018, 12:06 PM

## 2018-02-05 ENCOUNTER — Telehealth: Payer: Self-pay | Admitting: Pharmacy Technician

## 2018-02-05 NOTE — Telephone Encounter (Signed)
Patient given another recertification packet.  Patient understood that no more medication assistance can be provided without current financial documentation.  [facility]

## 2018-02-13 ENCOUNTER — Ambulatory Visit: Payer: Self-pay | Admitting: Pharmacist

## 2018-02-13 ENCOUNTER — Ambulatory Visit: Payer: Self-pay

## 2018-02-13 ENCOUNTER — Encounter: Payer: Self-pay | Admitting: Pharmacist

## 2018-02-13 ENCOUNTER — Telehealth (INDEPENDENT_AMBULATORY_CARE_PROVIDER_SITE_OTHER): Payer: Self-pay | Admitting: Vascular Surgery

## 2018-02-13 VITALS — BP 142/90 | Ht 62.0 in | Wt 216.0 lb

## 2018-02-13 DIAGNOSIS — Z79899 Other long term (current) drug therapy: Secondary | ICD-10-CM

## 2018-02-13 NOTE — Telephone Encounter (Signed)
Called the patient and gave her Dr. Bunnie Domino recommendations regarding the patient's message. Patient kept stating she was in unbearable pain I advised that she go to the ED for evaluation.

## 2018-02-13 NOTE — Telephone Encounter (Signed)
Patient had a Visceral angiogram with you on 01/28/18. See note below.
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
## 2018-02-13 NOTE — Telephone Encounter (Signed)
Patient called stating that she has a lump on the top of her stomach and said she is in intense pain. Stated that she has lost some weight but lump size is the same.

## 2018-02-15 ENCOUNTER — Emergency Department
Admission: EM | Admit: 2018-02-15 | Discharge: 2018-02-15 | Disposition: A | Discharge status: Home / Self Care | Payer: Medicaid Other | Attending: Emergency Medicine | Admitting: Emergency Medicine

## 2018-02-15 ENCOUNTER — Other Ambulatory Visit: Payer: Self-pay

## 2018-02-15 ENCOUNTER — Encounter: Payer: Self-pay | Admitting: Emergency Medicine

## 2018-02-15 DIAGNOSIS — R1011 Right upper quadrant pain: Secondary | ICD-10-CM | POA: Diagnosis present

## 2018-02-15 DIAGNOSIS — Z5321 Procedure and treatment not carried out due to patient leaving prior to being seen by health care provider: Secondary | ICD-10-CM | POA: Insufficient documentation

## 2018-02-15 LAB — PROTIME-INR
INR: 1
Prothrombin Time: 13.1 seconds (ref 11.4–15.2)

## 2018-02-15 LAB — CBC
HCT: 32.9 % — ABNORMAL LOW (ref 35.0–47.0)
Hemoglobin: 10.9 g/dL — ABNORMAL LOW (ref 12.0–16.0)
MCH: 26.7 pg (ref 26.0–34.0)
MCHC: 33.3 g/dL (ref 32.0–36.0)
MCV: 80.2 fL (ref 80.0–100.0)
Platelets: 492 10*3/uL — ABNORMAL HIGH (ref 150–440)
RBC: 4.1 MIL/uL (ref 3.80–5.20)
RDW: 17.8 % — ABNORMAL HIGH (ref 11.5–14.5)
WBC: 6.6 10*3/uL (ref 3.6–11.0)

## 2018-02-15 LAB — LIPASE, BLOOD: Lipase: 47 U/L (ref 11–51)

## 2018-02-15 LAB — COMPREHENSIVE METABOLIC PANEL
ALT: 13 U/L — ABNORMAL LOW (ref 14–54)
AST: 16 U/L (ref 15–41)
Albumin: 3.6 g/dL (ref 3.5–5.0)
Alkaline Phosphatase: 83 U/L (ref 38–126)
Anion gap: 11 (ref 5–15)
BUN: 11 mg/dL (ref 6–20)
CO2: 22 mmol/L (ref 22–32)
Calcium: 9.1 mg/dL (ref 8.9–10.3)
Chloride: 103 mmol/L (ref 101–111)
Creatinine, Ser: 0.84 mg/dL (ref 0.44–1.00)
GFR calc Af Amer: >60 mL/min (ref >60)
GFR calc non Af Amer: >60 mL/min (ref >60)
Glucose, Bld: 122 mg/dL — ABNORMAL HIGH (ref 65–99)
Potassium: 3.7 mmol/L (ref 3.5–5.1)
Sodium: 136 mmol/L (ref 135–145)
Total Bilirubin: 0.3 mg/dL (ref 0.3–1.2)
Total Protein: 7.4 g/dL (ref 6.5–8.1)

## 2018-02-15 LAB — APTT: aPTT: 29 seconds (ref 24–36)

## 2018-02-15 NOTE — ED Triage Notes (Signed)
Abdominal pain and swelling (RUQ) since discharge from recent hospitalization.  Denies emesis. C/O pain, worse after eating.

## 2018-02-18 ENCOUNTER — Telehealth: Payer: Self-pay | Admitting: Emergency Medicine

## 2018-02-18 NOTE — Telephone Encounter (Signed)
Called patient due to lwot to inquire about condition and follow up plans.  Says she has been having large upper abd for about a year.  I asked her to call pcp to have them review labs and see when they can do exam. She stated that they only drew blood.    She she came to the ED because she wanted some xrays.  I explained that the most important thing now is to get an exam.  Then if her pcp determines that xrays are needed they can order them as an outpatient.

## 2018-02-26 ENCOUNTER — Other Ambulatory Visit (INDEPENDENT_AMBULATORY_CARE_PROVIDER_SITE_OTHER): Payer: Self-pay | Admitting: Vascular Surgery

## 2018-02-26 DIAGNOSIS — K559 Vascular disorder of intestine, unspecified: Secondary | ICD-10-CM

## 2018-02-26 DIAGNOSIS — Z9862 Peripheral vascular angioplasty status: Secondary | ICD-10-CM

## 2018-02-27 ENCOUNTER — Ambulatory Visit (INDEPENDENT_AMBULATORY_CARE_PROVIDER_SITE_OTHER): Payer: Self-pay | Admitting: Vascular Surgery

## 2018-02-27 ENCOUNTER — Encounter (INDEPENDENT_AMBULATORY_CARE_PROVIDER_SITE_OTHER): Payer: Self-pay

## 2018-03-11 ENCOUNTER — Encounter (INDEPENDENT_AMBULATORY_CARE_PROVIDER_SITE_OTHER): Payer: Self-pay | Admitting: Vascular Surgery

## 2018-04-07 ENCOUNTER — Observation Stay
Admission: EM | Admit: 2018-04-07 | Discharge: 2018-04-09 | Disposition: A | Discharge status: Home / Self Care | Payer: Medicare Other | Attending: Internal Medicine | Admitting: Internal Medicine

## 2018-04-07 ENCOUNTER — Emergency Department: Payer: Medicare Other

## 2018-04-07 DIAGNOSIS — K439 Ventral hernia without obstruction or gangrene: Secondary | ICD-10-CM | POA: Diagnosis not present

## 2018-04-07 DIAGNOSIS — G510 Bell's palsy: Secondary | ICD-10-CM | POA: Diagnosis not present

## 2018-04-07 DIAGNOSIS — I160 Hypertensive urgency: Secondary | ICD-10-CM | POA: Diagnosis present

## 2018-04-07 DIAGNOSIS — Z7982 Long term (current) use of aspirin: Secondary | ICD-10-CM | POA: Diagnosis not present

## 2018-04-07 DIAGNOSIS — R079 Chest pain, unspecified: Secondary | ICD-10-CM | POA: Diagnosis present

## 2018-04-07 DIAGNOSIS — K551 Chronic vascular disorders of intestine: Secondary | ICD-10-CM | POA: Diagnosis not present

## 2018-04-07 DIAGNOSIS — Z803 Family history of malignant neoplasm of breast: Secondary | ICD-10-CM | POA: Insufficient documentation

## 2018-04-07 DIAGNOSIS — I700 Atherosclerosis of aorta: Secondary | ICD-10-CM | POA: Diagnosis not present

## 2018-04-07 DIAGNOSIS — Z86718 Personal history of other venous thrombosis and embolism: Secondary | ICD-10-CM | POA: Diagnosis not present

## 2018-04-07 DIAGNOSIS — M170 Bilateral primary osteoarthritis of knee: Secondary | ICD-10-CM | POA: Insufficient documentation

## 2018-04-07 DIAGNOSIS — Z9049 Acquired absence of other specified parts of digestive tract: Secondary | ICD-10-CM | POA: Insufficient documentation

## 2018-04-07 DIAGNOSIS — K5904 Chronic idiopathic constipation: Secondary | ICD-10-CM | POA: Insufficient documentation

## 2018-04-07 DIAGNOSIS — Z87891 Personal history of nicotine dependence: Secondary | ICD-10-CM | POA: Diagnosis not present

## 2018-04-07 DIAGNOSIS — Z79899 Other long term (current) drug therapy: Secondary | ICD-10-CM | POA: Insufficient documentation

## 2018-04-07 DIAGNOSIS — Z6838 Body mass index (BMI) 38.0-38.9, adult: Secondary | ICD-10-CM | POA: Diagnosis not present

## 2018-04-07 DIAGNOSIS — Z85828 Personal history of other malignant neoplasm of skin: Secondary | ICD-10-CM | POA: Diagnosis not present

## 2018-04-07 DIAGNOSIS — R52 Pain, unspecified: Secondary | ICD-10-CM

## 2018-04-07 DIAGNOSIS — F329 Major depressive disorder, single episode, unspecified: Secondary | ICD-10-CM | POA: Insufficient documentation

## 2018-04-07 DIAGNOSIS — K219 Gastro-esophageal reflux disease without esophagitis: Secondary | ICD-10-CM | POA: Insufficient documentation

## 2018-04-07 DIAGNOSIS — M5441 Lumbago with sciatica, right side: Secondary | ICD-10-CM | POA: Insufficient documentation

## 2018-04-07 DIAGNOSIS — E785 Hyperlipidemia, unspecified: Secondary | ICD-10-CM | POA: Insufficient documentation

## 2018-04-07 DIAGNOSIS — G9340 Encephalopathy, unspecified: Secondary | ICD-10-CM | POA: Diagnosis present

## 2018-04-07 DIAGNOSIS — Z7901 Long term (current) use of anticoagulants: Secondary | ICD-10-CM | POA: Insufficient documentation

## 2018-04-07 DIAGNOSIS — E782 Mixed hyperlipidemia: Secondary | ICD-10-CM | POA: Diagnosis present

## 2018-04-07 DIAGNOSIS — I998 Other disorder of circulatory system: Secondary | ICD-10-CM | POA: Diagnosis not present

## 2018-04-07 DIAGNOSIS — I10 Essential (primary) hypertension: Principal | ICD-10-CM | POA: Insufficient documentation

## 2018-04-07 DIAGNOSIS — Z8249 Family history of ischemic heart disease and other diseases of the circulatory system: Secondary | ICD-10-CM | POA: Insufficient documentation

## 2018-04-07 DIAGNOSIS — Z90710 Acquired absence of both cervix and uterus: Secondary | ICD-10-CM | POA: Insufficient documentation

## 2018-04-07 LAB — BLOOD GAS, VENOUS
Acid-base deficit: 0.8 mmol/L (ref 0.0–2.0)
Bicarbonate: 24.3 mmol/L (ref 20.0–28.0)
O2 Saturation: 98.2 %
Patient temperature: 37
pCO2, Ven: 41 mmHg — ABNORMAL LOW (ref 44.0–60.0)
pH, Ven: 7.38 (ref 7.250–7.430)
pO2, Ven: 111 mmHg — ABNORMAL HIGH (ref 32.0–45.0)

## 2018-04-07 LAB — CBC WITH DIFFERENTIAL/PLATELET
Basophils Absolute: 0 10*3/uL (ref 0–0.1)
Basophils Relative: 0 %
Eosinophils Absolute: 0 10*3/uL (ref 0–0.7)
Eosinophils Relative: 0 %
HCT: 31 % — ABNORMAL LOW (ref 35.0–47.0)
Hemoglobin: 10.7 g/dL — ABNORMAL LOW (ref 12.0–16.0)
Lymphocytes Relative: 13 %
Lymphs Abs: 1.3 10*3/uL (ref 1.0–3.6)
MCH: 26.7 pg (ref 26.0–34.0)
MCHC: 34.3 g/dL (ref 32.0–36.0)
MCV: 77.7 fL — ABNORMAL LOW (ref 80.0–100.0)
Monocytes Absolute: 0.2 10*3/uL (ref 0.2–0.9)
Monocytes Relative: 3 %
Neutro Abs: 8 10*3/uL — ABNORMAL HIGH (ref 1.4–6.5)
Neutrophils Relative %: 84 %
Platelets: 346 10*3/uL (ref 150–440)
RBC: 4 MIL/uL (ref 3.80–5.20)
RDW: 17.2 % — ABNORMAL HIGH (ref 11.5–14.5)
WBC: 9.5 10*3/uL (ref 3.6–11.0)

## 2018-04-07 LAB — COMPREHENSIVE METABOLIC PANEL
ALT: 11 U/L — ABNORMAL LOW (ref 14–54)
AST: 13 U/L — ABNORMAL LOW (ref 15–41)
Albumin: 3.5 g/dL (ref 3.5–5.0)
Alkaline Phosphatase: 101 U/L (ref 38–126)
Anion gap: 11 (ref 5–15)
BUN: 14 mg/dL (ref 6–20)
CO2: 22 mmol/L (ref 22–32)
Calcium: 8.8 mg/dL — ABNORMAL LOW (ref 8.9–10.3)
Chloride: 100 mmol/L — ABNORMAL LOW (ref 101–111)
Creatinine, Ser: 0.82 mg/dL (ref 0.44–1.00)
GFR calc Af Amer: >60 mL/min (ref >60)
GFR calc non Af Amer: >60 mL/min (ref >60)
Glucose, Bld: 162 mg/dL — ABNORMAL HIGH (ref 65–99)
Potassium: 3.4 mmol/L — ABNORMAL LOW (ref 3.5–5.1)
Sodium: 133 mmol/L — ABNORMAL LOW (ref 135–145)
Total Bilirubin: 0.4 mg/dL (ref 0.3–1.2)
Total Protein: 7.3 g/dL (ref 6.5–8.1)

## 2018-04-07 LAB — URINE DRUG SCREEN, QUALITATIVE (ARMC ONLY)
Amphetamines, Ur Screen: NOT DETECTED
Benzodiazepine, Ur Scrn: NOT DETECTED
Cannabinoid 50 Ng, Ur ~~LOC~~: NOT DETECTED
Cocaine Metabolite,Ur ~~LOC~~: NOT DETECTED
MDMA (Ecstasy)Ur Screen: NOT DETECTED
Methadone Scn, Ur: NOT DETECTED
Opiate, Ur Screen: NOT DETECTED
Phencyclidine (PCP) Ur S: NOT DETECTED
Tricyclic, Ur Screen: NOT DETECTED

## 2018-04-07 LAB — URINALYSIS, COMPLETE (UACMP) WITH MICROSCOPIC
Bacteria, UA: NONE SEEN
Bilirubin Urine: NEGATIVE
Glucose, UA: NEGATIVE mg/dL
Ketones, ur: NEGATIVE mg/dL
Leukocytes, UA: NEGATIVE
Nitrite: NEGATIVE
Protein, ur: NEGATIVE mg/dL
Specific Gravity, Urine: 1.012 (ref 1.005–1.030)
pH: 6 (ref 5.0–8.0)

## 2018-04-07 LAB — TROPONIN I: Troponin I: <0.03 ng/mL (ref <0.03)

## 2018-04-07 MED ORDER — IOPAMIDOL (ISOVUE-370) INJECTION 76%
100.0000 mL | Freq: Once | INTRAVENOUS | Status: AC | PRN
  Administered 2018-04-07: 100 mL via INTRAVENOUS

## 2018-04-07 MED ORDER — NICARDIPINE HCL IN NACL 20-0.86 MG/200ML-% IV SOLN
3.0000 mg/h | Freq: Once | INTRAVENOUS | Status: AC
  Administered 2018-04-07: 5 mg/h via INTRAVENOUS
  Filled 2018-04-07: qty 200

## 2018-04-07 MED ORDER — LABETALOL HCL 5 MG/ML IV SOLN
5.0000 mg | Freq: Once | INTRAVENOUS | Status: AC
  Administered 2018-04-07: 5 mg via INTRAVENOUS
  Filled 2018-04-07: qty 4

## 2018-04-07 NOTE — ED Notes (Signed)
Patient's friend who brought her to the ED states that she had been to wash clothes, on her return at 5 pm she complained of chest pain, abdominal pain and back pain.  Reports shortly after that she began to vomit and complain of dizziness.

## 2018-04-07 NOTE — ED Notes (Signed)
MD Robinson at bedside 

## 2018-04-07 NOTE — ED Triage Notes (Signed)
Patient c/o chest pain and dizziness to first nurse. Patient lethargic, somnolent for this RN. Snoring in room; will not stay awake or follow instructions.

## 2018-04-07 NOTE — ED Notes (Signed)
Patient transported to CT 

## 2018-04-07 NOTE — ED Provider Notes (Addendum)
[facility] Emergency Department Provider Note    First MD Initiated Contact with Patient 04/07/18 2121     (approximate)  I have reviewed the triage vital signs and the nursing notes.   HISTORY  Chief Complaint Altered Mental Status and Chest Pain  Level V Caveat:  Acute encephalopathy   HPI Carla [name] is a 57 y.o. female presents the ER via EMS.  Patient arrives very encephalopathic.  Moving all extremities but unable to provide much history.  Able to answer in 1 or 2 word sentences.  Found to be profoundly hypertensive.  Limited history but per patient's friend and EMS she was reportedly complaining of chest pain that was ripping and tearing through to her back and going down into her stomach.  Is started becoming very confused.  Did have one episode of emesis and was complaining of generalized dizziness.  She is on Eliquis and states that she did take that medication.  Denies any other substance abuse or drug use.    Past Medical History:  Diagnosis Date  . Abdominal aortic atherosclerosis ([place])    a. 05/2017 CTA abd/pelvis: significant atherosclerotic dzs of the infrarenal abd Ao w/ some mural thrombus. No aneurysm or dissection.  . Abdominal pain   . Acute focal ischemia of small intestine (HCC)   . Acute right-sided low back pain with right-sided sciatica 06/13/2017  . AKI (acute kidney injury) ([place]) 07/29/2017  . Baker's cyst of knee, right    a. 07/2016 U/S: 4.1 x 1.4 x 2.9 cystic structure in R poplitetal fossa.  . Bell's palsy   . Cancer ([place])   . Chest pain    a. 08/2012 Lexiscan MV: EF 54%, non ischemia/infarct.  . Chronic idiopathic constipation   . Chronic mesenteric ischemia ([place])   . Diastolic dysfunction    a. 05/2017 Echo: Ef 60-65%, no rwma, Gr1 DD, no source of cardiac emboli.  . 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 05/2017 CTA Abd/pelvis: apparent thrombus or embolus in prox SMA (70-90%); b. 05/2017 catheter directed  tPA, mechanical thrombectomy, and stenting of the SMA.  . Essential hypertension with goal blood pressure less than 140/90 01/19/2016  . Gastroesophageal reflux disease 01/19/2016  . GERD (gastroesophageal reflux disease)   . Hyperlipidemia   . Hypertension   . Hypotension 07/29/2017  . Morbid obesity with BMI of 40.0-44.9, adult ([place])   . Occlusive mesenteric ischemia ([place]) 06/19/2017  . Personal history of other malignant neoplasm of skin 06/01/2016  . Primary osteoarthritis of both knees 06/13/2017  . Recurrent major depressive disorder ([place]) 01/19/2016  . SBO (small bowel obstruction) ([place]) 08/06/2017  . 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 Urinary tract infection 01/09/2014   Family History  Problem Relation Age of Onset  . Hypertension Mother   . Heart disease Mother   . Heart attack Mother   . Breast cancer Mother   . Hypertension Father   . Breast cancer Paternal Aunt    Past Surgical History:  Procedure Laterality Date  . CHOLECYSTECTOMY    . LAPAROTOMY N/A 08/08/2017   Procedure: EXPLORATORY LAPAROTOMY POSSIBLE BOWEL RESECTION;  Surgeon: Jules Husbands, MD;  Location: [facility] ORS;  Service: General;  Laterality: N/A;  . TEE WITHOUT CARDIOVERSION N/A 06/22/2017   Procedure: TRANSESOPHAGEAL ECHOCARDIOGRAM (TEE);  Surgeon: Wellington Hampshire, MD;  Location: [facility] ORS;  Service: Cardiovascular;  Laterality: N/A;  . VAGINAL HYSTERECTOMY    . VISCERAL ARTERY INTERVENTION N/A 06/20/2017  Procedure: Visceral Artery Intervention, possible aortic thrombectomy;  Surgeon: Algernon Huxley, MD;  Location: [facility];  Service: Cardiovascular;  Laterality: N/A;  . VISCERAL ARTERY INTERVENTION N/A 01/28/2018   Procedure: VISCERAL ARTERY INTERVENTION;  Surgeon: Algernon Huxley, MD;  Location: [place];  Service: Cardiovascular;  Laterality: N/A;   Patient Active Problem List   Diagnosis Date Noted  . Accelerated hypertension 04/08/2018  . Acute encephalopathy 04/08/2018    . Adjustment disorder with anxiety 08/15/2017  . Chronic idiopathic constipation   . Protein-calorie malnutrition, severe 08/08/2017  . SBO (small bowel obstruction) ([place]) 08/06/2017  . Chronic mesenteric ischemia ([place])   . AKI (acute kidney injury) ([place]) 07/29/2017  . Hypotension 07/29/2017  . 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 Abdominal pain   . Occlusive mesenteric ischemia ([place]) 06/19/2017  . 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 Acute focal ischemia of small intestine (HCC)   . Acute right-sided low back pain with right-sided sciatica 06/13/2017  . Primary osteoarthritis of both knees 06/13/2017  . Pain syndrome, chronic 09/13/2016  . Morbid obesity with BMI of 40.0-44.9, adult ([place]) 07/16/2016  . Personal history of other malignant neoplasm of skin 06/01/2016  . Chronic pain of both knees 01/19/2016  . Essential hypertension with goal blood pressure less than 140/90 01/19/2016  . Gastroesophageal reflux disease 01/19/2016  . Hyperlipidemia 01/19/2016  . Prediabetes 01/19/2016  . Recurrent major depressive disorder ([place]) 01/19/2016  . Urinary tract infection 01/09/2014  . Chronic pelvic pain in female 11/14/2013  . Mixed stress and urge urinary incontinence 11/14/2013  . Chest pain 08/06/2012      Prior to Admission medications   Medication Sig Start Date End Date Taking? Authorizing Provider  apixaban (ELIQUIS) 5 MG TABS tablet Take 1 tablet (5 mg total) by mouth 2 (two) times daily. 01/30/18 03/01/18  Henreitta Leber, MD  aspirin EC 81 MG tablet Take 81 mg by mouth daily.     [provider]  atorvastatin (LIPITOR) 80 MG tablet Take 1 tablet (80 mg total) by mouth at bedtime. 01/30/18 03/01/18  Henreitta Leber, MD  docusate sodium (COLACE) 100 MG capsule Take 100 mg by mouth daily as needed for mild constipation (AT BEDTIME).    [provider]  DULoxetine (CYMBALTA) 30 MG capsule Take 1 capsule by mouth daily. 08/24/17   [provider]  feeding supplement, ENSURE ENLIVE, (ENSURE ENLIVE) LIQD Take 237 mLs by mouth 2 (two) times daily between meals. 01/31/18   Bettey Costa, MD  gabapentin (NEURONTIN) 300 MG capsule Take 1 capsule (300 mg total) by mouth 3 (three) times daily. 01/30/18 03/01/18  Henreitta Leber, MD  omeprazole (PRILOSEC) 20 MG capsule Take 1 capsule (20 mg total) by mouth daily. 01/30/18 03/01/18  Henreitta Leber, MD  ondansetron (ZOFRAN) 4 MG tablet Take 1 tablet (4 mg total) by mouth every 6 (six) hours as needed for nausea. 01/30/18   Henreitta Leber, MD  traMADol (ULTRAM) 50 MG tablet Take 1 tablet by mouth every 6 (six) hours as needed.    [provider]    Allergies Patient has no known allergies.    Social History Social History   Tobacco Use  . Smoking status: Former Smoker    Packs/day: 0.50    Types: Cigarettes    Last attempt to quit: 01/31/2018    Years since quitting: 0.1  . Smokeless tobacco: Never Used  Substance Use Topics  .  Alcohol use: No  . Drug use: No    Review of Systems Patient denies headaches, rhinorrhea, blurry vision, numbness, shortness of breath, chest pain, edema, cough, abdominal pain, nausea, vomiting, diarrhea, dysuria, fevers, rashes or hallucinations unless otherwise stated above in HPI. ____________________________________________   PHYSICAL EXAM:  VITAL SIGNS: Vitals:   04/07/18 2355 04/08/18 0000  BP: 140/79 (!) 142/79  Pulse: 63 63  Resp: 14 14  Temp:    SpO2: 97% 97%    Constitutional: Critically ill-appearing, encephalopathic, will respond to sternal rub and follows simple commands but quickly falls back asleep.  All extremities.  No evidence of trauma or bleeding Eyes: Conjunctivae are normal.  Pupils 65mm bilaterally Head: Atraumatic. Nose: No congestion/rhinnorhea. Mouth/Throat: Mucous membranes are moist.   Neck: No stridor. Painless ROM.  Cardiovascular: Normal rate, regular rhythm. Grossly normal heart sounds.  Good  peripheral circulation. Respiratory: Normal respiratory effort.  No retractions. Lungs with coarse bibasilar breathsounds Gastrointestinal: Soft and nontender. No distention. No abdominal bruits. No CVA tenderness. Genitourinary: deferred Musculoskeletal: No lower extremity tenderness nor edema.  No joint effusions. Neurologic:  Slurred speech, oopens eyes to sternal rub, localizes to pain and crosses midline., no nystagmus Skin:  Skin is warm, dry and intact. No rash noted. Psychiatric: unable to assess  ____________________________________________   LABS (all labs ordered are listed, but only abnormal results are displayed)  Results for orders placed or performed during the hospital encounter of 04/07/18 (from the past 24 hour(s))  Troponin I     Status: None   Collection Time: 04/07/18  9:35 PM  Result Value Ref Range   Troponin I <0.03 <0.03 ng/mL  Comprehensive metabolic panel     Status: Abnormal   Collection Time: 04/07/18  9:35 PM  Result Value Ref Range   Sodium 133 (L) 135 - 145 mmol/L   Potassium 3.4 (L) 3.5 - 5.1 mmol/L   Chloride 100 (L) 101 - 111 mmol/L   CO2 22 22 - 32 mmol/L   Glucose, Bld 162 (H) 65 - 99 mg/dL   BUN 14 6 - 20 mg/dL   Creatinine, Ser 0.82 0.44 - 1.00 mg/dL   Calcium 8.8 (L) 8.9 - 10.3 mg/dL   Total Protein 7.3 6.5 - 8.1 g/dL   Albumin 3.5 3.5 - 5.0 g/dL   AST 13 (L) 15 - 41 U/L   ALT 11 (L) 14 - 54 U/L   Alkaline Phosphatase 101 38 - 126 U/L   Total Bilirubin 0.4 0.3 - 1.2 mg/dL   GFR calc non Af Amer >60 >60 mL/min   GFR calc Af Amer >60 >60 mL/min   Anion gap 11 5 - 15  CBC with Differential     Status: Abnormal   Collection Time: 04/07/18  9:35 PM  Result Value Ref Range   WBC 9.5 3.6 - 11.0 K/uL   RBC 4.00 3.80 - 5.20 MIL/uL   Hemoglobin 10.7 (L) 12.0 - 16.0 g/dL   HCT 31.0 (L) 35.0 - 47.0 %   MCV 77.7 (L) 80.0 - 100.0 fL   MCH 26.7 26.0 - 34.0 pg   MCHC 34.3 32.0 - 36.0 g/dL   RDW 17.2 (H) 11.5 - 14.5 %   Platelets 346 150 - 440  K/uL   Neutrophils Relative % 84 %   Neutro Abs 8.0 (H) 1.4 - 6.5 K/uL   Lymphocytes Relative 13 %   Lymphs Abs 1.3 1.0 - 3.6 K/uL   Monocytes Relative 3 %   Monocytes Absolute 0.2  0.2 - 0.9 K/uL   Eosinophils Relative 0 %   Eosinophils Absolute 0.0 0 - 0.7 K/uL   Basophils Relative 0 %   Basophils Absolute 0.0 0 - 0.1 K/uL  Urine Drug Screen, Qualitative ([facility] only)     Status: Abnormal   Collection Time: 04/07/18  9:35 PM  Result Value Ref Range   Tricyclic, Ur Screen NONE DETECTED NONE DETECTED   Amphetamines, Ur Screen NONE DETECTED NONE DETECTED   MDMA (Ecstasy)Ur Screen NONE DETECTED NONE DETECTED   Cocaine Metabolite,Ur [place] NONE DETECTED NONE DETECTED   Opiate, Ur Screen NONE DETECTED NONE DETECTED   Phencyclidine (PCP) Ur S NONE DETECTED NONE DETECTED   Cannabinoid 50 Ng, Ur [place] NONE DETECTED NONE DETECTED   Barbiturates, Ur Screen (A) NONE DETECTED    Result not available. Reagent lot number recalled by manufacturer.   Benzodiazepine, Ur Scrn NONE DETECTED NONE DETECTED   Methadone Scn, Ur NONE DETECTED NONE DETECTED  Urinalysis, Complete w Microscopic     Status: Abnormal   Collection Time: 04/07/18  9:35 PM  Result Value Ref Range   Color, Urine YELLOW (A) YELLOW   APPearance CLEAR (A) CLEAR   Specific Gravity, Urine 1.012 1.005 - 1.030   pH 6.0 5.0 - 8.0   Glucose, UA NEGATIVE NEGATIVE mg/dL   Hgb urine dipstick SMALL (A) NEGATIVE   Bilirubin Urine NEGATIVE NEGATIVE   Ketones, ur NEGATIVE NEGATIVE mg/dL   Protein, ur NEGATIVE NEGATIVE mg/dL   Nitrite NEGATIVE NEGATIVE   Leukocytes, UA NEGATIVE NEGATIVE   RBC / HPF 0-5 0 - 5 RBC/hpf   WBC, UA 0-5 0 - 5 WBC/hpf   Bacteria, UA NONE SEEN NONE SEEN   Squamous Epithelial / LPF 0-5 0 - 5  Blood gas, venous     Status: Abnormal   Collection Time: 04/07/18  9:35 PM  Result Value Ref Range   pH, Ven 7.38 7.250 - 7.430   pCO2, Ven 41 (L) 44.0 - 60.0 mmHg   pO2, Ven 111.0 (H) 32.0 - 45.0 mmHg   Bicarbonate 24.3 20.0 -  28.0 mmol/L   Acid-base deficit 0.8 0.0 - 2.0 mmol/L   O2 Saturation 98.2 %   Patient temperature 37.0    Collection site VEIN    Sample type VENOUS    ____________________________________________  EKG My review and personal interpretation at Time: 21:21   Indication: htn  Rate: 75  Rhythm: sinus Axis: normal Other: nonspecific st abn, no stemi ____________________________________________  RADIOLOGY  I personally reviewed all radiographic images ordered to evaluate for the above acute complaints and reviewed radiology reports and findings.  These findings were personally discussed with the patient.  Please see medical record for radiology report.  ____________________________________________   PROCEDURES  Procedure(s) performed:  .Critical Care Performed by: Merlyn Lot, MD Authorized by: Merlyn Lot, MD   Critical care provider statement:    Critical care time (minutes):  40   Critical care time was exclusive of:  Separately billable procedures and treating other patients   Critical care was necessary to treat or prevent imminent or life-threatening deterioration of the following conditions:  CNS failure or compromise   Critical care was time spent personally by me on the following activities:  Development of treatment plan with patient or surrogate, discussions with consultants, evaluation of patient's response to treatment, examination of patient, obtaining history from patient or surrogate, ordering and performing treatments and interventions, ordering and review of laboratory studies, ordering and review of radiographic studies, pulse  oximetry, re-evaluation of patient's condition and review of old [place] performed: yes ____________________________________________   INITIAL IMPRESSION / ASSESSMENT AND PLAN / ED COURSE  Pertinent labs & imaging results that were available during my care of the patient were reviewed by me and considered  in my medical decision making (see chart for details).   DDX: Dehydration, sepsis, pna, uti, hypoglycemia, cva, drug effect, withdrawal, encephalitis   AMEL KITCH is a 58 y.o. who presents to the ED with symptoms as described above.  Patient critically ill.  Bizarre presentation.  She currently protecting her airway moving all extremities but very encephalopathic.  Will take immediately to CT imaging after obtaining IV access, placed on cardiac monitor.  Patient noted to be profoundly hypertensive therefore concerning for hemorrhage.  The patient will be placed on continuous pulse oximetry and telemetry for monitoring.  Laboratory evaluation will be sent to evaluate for the above complaints.     Clinical Course as of Apr 08 29  Sun Apr 07, 2018  2131 Patient with very bizarre presentation.  She does admit to taking Eliquis therefore patient not candidate for TPA.   [PR]  2138 Friend is now bedside and states the patient had sudden onset of confusion chest pain back pain abdominal pain around 4:00.   [PR]  2226 Patient still moving all extremities but very encephalopathic.  Currently obtaining additional IV access to be able to order CT angiogram to rule out dissection.  Was given IV labetalol for her hypertension.  Press is also on the differential.  Possible overdose.  Very broad differential at this time.  We will continue to monitor.   [PR]  Mon Apr 08, 2018  0021 Patient reassessed.  Still protecting her airway.  Satting well.  No evidence of dissection.  Family reports use of baclofen.  Probable overdose, PRESS remains on differential as well as CVA.  Will continue IV antihypertensive medications and plan for admission to hospital for further evaluation and management.  Have discussed with the available family all diagnostics and treatments performed thus far and all questions were answered to the best of my ability.     [PR]    Clinical Course User Index [PR] Merlyn Lot, MD      As part of my medical decision making, I reviewed the following data within the [place] notes reviewed and incorporated, Labs reviewed, notes from prior ED visits and [place] Controlled Substance Database   ____________________________________________   FINAL CLINICAL IMPRESSION(S) / ED DIAGNOSES  Final diagnoses:  Acute encephalopathy  Hypertensive urgency, malignant      NEW MEDICATIONS STARTED DURING THIS VISIT:  New Prescriptions   No medications on file     Note:  This document was prepared using Dragon voice recognition software and may include unintentional dictation errors.    Merlyn Lot, MD 04/08/18 9147    Merlyn Lot, MD 04/08/18 Lupita Shutter    Merlyn Lot, MD 04/08/18 0030

## 2018-04-08 ENCOUNTER — Other Ambulatory Visit: Payer: Medicaid Other

## 2018-04-08 ENCOUNTER — Observation Stay: Payer: Medicare Other

## 2018-04-08 ENCOUNTER — Observation Stay (HOSPITAL_BASED_OUTPATIENT_CLINIC_OR_DEPARTMENT_OTHER)
Admit: 2018-04-08 | Discharge: 2018-04-08 | Disposition: A | Discharge status: Home / Self Care | Payer: Medicare Other | Attending: Internal Medicine | Admitting: Internal Medicine

## 2018-04-08 ENCOUNTER — Other Ambulatory Visit: Payer: Self-pay

## 2018-04-08 ENCOUNTER — Encounter: Payer: Self-pay | Admitting: *Deleted

## 2018-04-08 DIAGNOSIS — I5030 Unspecified diastolic (congestive) heart failure: Secondary | ICD-10-CM

## 2018-04-08 DIAGNOSIS — I10 Essential (primary) hypertension: Secondary | ICD-10-CM | POA: Diagnosis not present

## 2018-04-08 DIAGNOSIS — G9340 Encephalopathy, unspecified: Secondary | ICD-10-CM | POA: Diagnosis present

## 2018-04-08 LAB — BASIC METABOLIC PANEL
Anion gap: 8 (ref 5–15)
BUN: 12 mg/dL (ref 6–20)
CO2: 25 mmol/L (ref 22–32)
Calcium: 9 mg/dL (ref 8.9–10.3)
Chloride: 105 mmol/L (ref 101–111)
Creatinine, Ser: 0.79 mg/dL (ref 0.44–1.00)
GFR calc Af Amer: >60 mL/min (ref >60)
GFR calc non Af Amer: >60 mL/min (ref >60)
Glucose, Bld: 129 mg/dL — ABNORMAL HIGH (ref 65–99)
Potassium: 4 mmol/L (ref 3.5–5.1)
Sodium: 138 mmol/L (ref 135–145)

## 2018-04-08 LAB — AMMONIA: Ammonia: 28 umol/L (ref 9–35)

## 2018-04-08 LAB — CBC
HCT: 33.1 % — ABNORMAL LOW (ref 35.0–47.0)
Hemoglobin: 11.1 g/dL — ABNORMAL LOW (ref 12.0–16.0)
MCH: 26.3 pg (ref 26.0–34.0)
MCHC: 33.4 g/dL (ref 32.0–36.0)
MCV: 78.9 fL — ABNORMAL LOW (ref 80.0–100.0)
Platelets: 357 10*3/uL (ref 150–440)
RBC: 4.2 MIL/uL (ref 3.80–5.20)
RDW: 17.1 % — ABNORMAL HIGH (ref 11.5–14.5)
WBC: 7.7 10*3/uL (ref 3.6–11.0)

## 2018-04-08 LAB — TSH: TSH: 2.823 u[IU]/mL (ref 0.350–4.500)

## 2018-04-08 LAB — TROPONIN I
Troponin I: <0.03 ng/mL (ref <0.03)
Troponin I: <0.03 ng/mL (ref <0.03)
Troponin I: <0.03 ng/mL (ref <0.03)

## 2018-04-08 LAB — ECHOCARDIOGRAM COMPLETE
Height: 63 in
Weight: 3464 oz

## 2018-04-08 MED ORDER — TRAMADOL HCL 50 MG PO TABS
50.0000 mg | ORAL_TABLET | Freq: Four times a day (QID) | ORAL | Status: DC | PRN
  Administered 2018-04-09: 50 mg via ORAL
  Filled 2018-04-08: qty 1

## 2018-04-08 MED ORDER — APIXABAN 5 MG PO TABS
5.0000 mg | ORAL_TABLET | Freq: Two times a day (BID) | ORAL | Status: DC
  Administered 2018-04-08 – 2018-04-09 (×3): 5 mg via ORAL
  Filled 2018-04-08 (×4): qty 1

## 2018-04-08 MED ORDER — ACETAMINOPHEN 325 MG PO TABS: 650.0000 mg | ORAL_TABLET | Freq: Four times a day (QID) | ORAL | Status: DC | PRN

## 2018-04-08 MED ORDER — ATORVASTATIN CALCIUM 20 MG PO TABS
80.0000 mg | ORAL_TABLET | Freq: Every day | ORAL | Status: DC
  Administered 2018-04-08: 80 mg via ORAL
  Filled 2018-04-08: qty 4

## 2018-04-08 MED ORDER — AMLODIPINE BESYLATE 5 MG PO TABS
5.0000 mg | ORAL_TABLET | Freq: Every day | ORAL | Status: DC
  Administered 2018-04-08 – 2018-04-09 (×2): 5 mg via ORAL
  Filled 2018-04-08 (×2): qty 1

## 2018-04-08 MED ORDER — HYDRALAZINE HCL 20 MG/ML IJ SOLN: 10.0000 mg | INTRAMUSCULAR | Status: DC | PRN

## 2018-04-08 MED ORDER — DULOXETINE HCL 30 MG PO CPEP
30.0000 mg | ORAL_CAPSULE | Freq: Every day | ORAL | Status: DC
  Administered 2018-04-08 – 2018-04-09 (×2): 30 mg via ORAL
  Filled 2018-04-08 (×2): qty 1

## 2018-04-08 MED ORDER — ONDANSETRON HCL 4 MG/2ML IJ SOLN: 4.0000 mg | Freq: Four times a day (QID) | INTRAMUSCULAR | Status: DC | PRN

## 2018-04-08 MED ORDER — PANTOPRAZOLE SODIUM 40 MG PO TBEC
40.0000 mg | DELAYED_RELEASE_TABLET | Freq: Every day | ORAL | Status: DC
  Administered 2018-04-08 – 2018-04-09 (×2): 40 mg via ORAL
  Filled 2018-04-08 (×2): qty 1

## 2018-04-08 MED ORDER — ACETAMINOPHEN 650 MG RE SUPP: 650.0000 mg | Freq: Four times a day (QID) | RECTAL | Status: DC | PRN

## 2018-04-08 MED ORDER — DOCUSATE SODIUM 100 MG PO CAPS
100.0000 mg | ORAL_CAPSULE | Freq: Every day | ORAL | Status: DC | PRN
  Administered 2018-04-09: 100 mg via ORAL
  Filled 2018-04-08: qty 1

## 2018-04-08 MED ORDER — ASPIRIN EC 81 MG PO TBEC
81.0000 mg | DELAYED_RELEASE_TABLET | Freq: Every day | ORAL | Status: DC
  Administered 2018-04-08 – 2018-04-09 (×2): 81 mg via ORAL
  Filled 2018-04-08 (×2): qty 1

## 2018-04-08 MED ORDER — LABETALOL HCL 5 MG/ML IV SOLN: 10.0000 mg | INTRAVENOUS | Status: DC | PRN

## 2018-04-08 MED ORDER — ONDANSETRON HCL 4 MG PO TABS: 4.0000 mg | ORAL_TABLET | Freq: Four times a day (QID) | ORAL | Status: DC | PRN

## 2018-04-08 NOTE — Progress Notes (Signed)
[facility] [place] NAME: Carla [name]    MR#:  [id number]  DATE OF BIRTH:  03/16/1961  SUBJECTIVE:  CHIEF COMPLAINT:   Chief Complaint  Patient presents with  . Altered Mental Status  . Chest Pain   -Patient thinks she might have taken an extra dose of her muscle relaxant yesterday.  Came with a acute encephalopathy.  Now alert and oriented.  Complaining of right knee pain  REVIEW OF SYSTEMS:  Review of Systems  Constitutional: Positive for chills and malaise/fatigue. Negative for fever.  HENT: Negative for congestion, ear discharge, hearing loss and nosebleeds.   Eyes: Negative for blurred vision and double vision.  Respiratory: Negative for cough, shortness of breath and wheezing.   Cardiovascular: Negative for chest pain and palpitations.  Gastrointestinal: Negative for abdominal pain, constipation, diarrhea, nausea and vomiting.  Genitourinary: Negative for dysuria.  Musculoskeletal: Positive for joint pain and myalgias.  Neurological: Negative for dizziness, focal weakness, seizures, weakness and headaches.  Psychiatric/Behavioral: Negative for depression.    DRUG ALLERGIES:  No Known Allergies  VITALS:  Blood pressure (!) 166/79, pulse 60, temperature 98 F (36.7 C), temperature source Oral, resp. rate 18, height 5\' 3"  (1.6 m), weight 98.2 kg (216 lb 8 oz), SpO2 93 %.  PHYSICAL EXAMINATION:  Physical Exam  GENERAL:  57 y.o.-year-old  Obese patient lying in the bed with no acute distress.  EYES: Pupils equal, round, reactive to light and accommodation. No scleral icterus. Extraocular muscles intact.  HEENT: Head atraumatic, normocephalic. Oropharynx and nasopharynx clear.  NECK:  Supple, no jugular venous distention. No thyroid enlargement, no tenderness.  LUNGS: Normal breath sounds bilaterally, no wheezing, rales,rhonchi or crepitation. No use of accessory muscles of respiration. Decreased bibasilar breath  sounds CARDIOVASCULAR: S1, S2 normal. No  rubs, or gallops. 2/6 systolic murmur present ABDOMEN: Soft, nontender, nondistended. Bowel sounds present. No organomegaly or mass.  EXTREMITIES: swollen knees- right is more tender to touch. No pedal edema, cyanosis, or clubbing.  NEUROLOGIC: Cranial nerves II through XII are intact. Muscle strength 5/5 in all extremities. Sensation intact. Gait not checked. Global weakness PSYCHIATRIC: The patient is alert and oriented x 3.  SKIN: No obvious rash, lesion, or ulcer.    LABORATORY PANEL:   CBC Recent Labs  Lab 04/08/18 0307  WBC 7.7  HGB 11.1*  HCT 33.1*  PLT 357   ------------------------------------------------------------------------------------------------------------------  Chemistries  Recent Labs  Lab 04/07/18 2135 04/08/18 0307  NA 133* 138  K 3.4* 4.0  CL 100* 105  CO2 22 25  GLUCOSE 162* 129*  BUN 14 12  CREATININE 0.82 0.79  CALCIUM 8.8* 9.0  AST 13*  --   ALT 11*  --   ALKPHOS 101  --   BILITOT 0.4  --    ------------------------------------------------------------------------------------------------------------------  Cardiac Enzymes Recent Labs  Lab 04/08/18 1247  TROPONINI <0.03   ------------------------------------------------------------------------------------------------------------------  RADIOLOGY:  Dg Chest 1 View  Result Date: 04/07/2018 CLINICAL DATA:  Chest pain and dizziness.  Lethargic and somnolent. EXAM: CHEST  1 VIEW COMPARISON:  08/15/2017 FINDINGS: Shallow inspiration. Mild cardiac enlargement. No vascular congestion, edema, or consolidation. Probable linear atelectasis in the lung bases. No blunting of costophrenic angles. No pneumothorax. Mediastinal contours appear intact. Degenerative changes in the spine. IMPRESSION: Shallow inspiration with linear atelectasis in the lung bases. Electronically Signed   By: Lucienne Capers M.D.   On: 04/07/2018 22:24   Ct Head Wo Contrast  Result  Date: 04/07/2018  CLINICAL DATA:  Altered level consciousness EXAM: CT HEAD WITHOUT CONTRAST TECHNIQUE: Contiguous axial images were obtained from the base of the skull through the vertex without intravenous contrast. COMPARISON:  Head CT 07/23/2016 FINDINGS: Brain: No acute intracranial hemorrhage. No focal mass lesion. No CT evidence of acute infarction. No midline shift or mass effect. No hydrocephalus. Basilar cisterns are patent. Vascular: No hyperdense vessel or unexpected calcification. Skull: Normal. Negative for fracture or focal lesion. Sinuses/Orbits: Paranasal sinuses and mastoid air cells are clear. Orbits are clear. Other: None. IMPRESSION: No acute intracranial findings. Electronically Signed   By: Suzy Bouchard M.D.   On: 04/07/2018 22:23   Mr Jodene Nam Head Wo Contrast  Result Date: 04/08/2018 CLINICAL DATA:  Altered level of consciousness. EXAM: MRI HEAD WITHOUT CONTRAST MRA HEAD WITHOUT CONTRAST TECHNIQUE: Multiplanar, multiecho pulse sequences of the brain and surrounding structures were obtained without intravenous contrast. Angiographic images of the head were obtained using MRA technique without contrast. COMPARISON:  07/23/2016 FINDINGS: MRI HEAD FINDINGS Brain: No acute infarction, hemorrhage, hydrocephalus, extra-axial collection or mass lesion. Patchy FLAIR hyperintensity in the cerebral white matter that is stable from prior. Pattern is nonspecific, medical history suggests chronic small vessel ischemia. Normal brain volume. Vascular: Normal dural venous sinus flow voids. Arterial findings noted below. Skull and upper cervical spine: No evidence of marrow lesion Sinuses/Orbits: Negative MRA HEAD FINDINGS The carotid, vertebral, and basilar arteries are smooth and diffusely patent. Fetal type left PCA. An nterior communicating artery is present. Accounting for motion artifact no occlusion or flow limiting stenosis is suspected in the major anterior and posterior circulation vessels. No  convincing aneurysm. IMPRESSION: Brain MRI: 1. No acute finding or change from 2017. 2. Motion degraded. 3. Mild white matter disease. Intracranial MRA: Negative when accounting for motion artifact. Electronically Signed   By: Monte Fantasia M.D.   On: 04/08/2018 10:59   Mr Brain Wo Contrast  Result Date: 04/08/2018 CLINICAL DATA:  Altered level of consciousness. EXAM: MRI HEAD WITHOUT CONTRAST MRA HEAD WITHOUT CONTRAST TECHNIQUE: Multiplanar, multiecho pulse sequences of the brain and surrounding structures were obtained without intravenous contrast. Angiographic images of the head were obtained using MRA technique without contrast. COMPARISON:  07/23/2016 FINDINGS: MRI HEAD FINDINGS Brain: No acute infarction, hemorrhage, hydrocephalus, extra-axial collection or mass lesion. Patchy FLAIR hyperintensity in the cerebral white matter that is stable from prior. Pattern is nonspecific, medical history suggests chronic small vessel ischemia. Normal brain volume. Vascular: Normal dural venous sinus flow voids. Arterial findings noted below. Skull and upper cervical spine: No evidence of marrow lesion Sinuses/Orbits: Negative MRA HEAD FINDINGS The carotid, vertebral, and basilar arteries are smooth and diffusely patent. Fetal type left PCA. An nterior communicating artery is present. Accounting for motion artifact no occlusion or flow limiting stenosis is suspected in the major anterior and posterior circulation vessels. No convincing aneurysm. IMPRESSION: Brain MRI: 1. No acute finding or change from 2017. 2. Motion degraded. 3. Mild white matter disease. Intracranial MRA: Negative when accounting for motion artifact. Electronically Signed   By: Monte Fantasia M.D.   On: 04/08/2018 10:59   Ct Angio Chest/abd/pel For Dissection W And/or Wo Contrast  Result Date: 04/07/2018 CLINICAL DATA:  Chest pain and dizziness. Lethargy. Hypertension. Extensive vascular history. EXAM: CT ANGIOGRAPHY CHEST, ABDOMEN AND PELVIS  TECHNIQUE: Multidetector CT imaging through the chest, abdomen and pelvis was performed using the standard protocol during bolus administration of intravenous contrast. Multiplanar reconstructed images and MIPs were obtained and reviewed to evaluate the vascular  anatomy. CONTRAST:  179mL ISOVUE-370 IOPAMIDOL (ISOVUE-370) INJECTION 76% COMPARISON:  CT abdomen and pelvis 01/24/2018. CTA chest abdomen and pelvis 06/19/2017 FINDINGS: CTA CHEST FINDINGS Cardiovascular: Noncontrast CT images of the chest demonstrate scattered calcifications in the thoracic aorta and coronary arteries. No evidence of intramural hematoma in the aorta. Calcified granuloma along the crus of the diaphragm at the hiatus. Images obtained during arterial phase after intravenous injection of contrast material demonstrates good opacification of the thoracic aorta. No aortic aneurysm or dissection. Great vessel origins are patent. Normal heart size. No pericardial effusion. Central pulmonary arteries are well opacified without evidence of pulmonary embolus. Mediastinum/Nodes: Esophagus is decompressed. No significant lymphadenopathy in the chest. Thyroid gland is unremarkable. Axillary lymph nodes are not pathologically enlarged. Lungs/Pleura: Atelectasis in the lung bases. No evidence of consolidation or edema in the lungs. No pleural effusions. No pneumothorax. Airways are patent. Musculoskeletal: No destructive bone lesions or acute fractures identified. Degenerative changes in the spine. Review of the MIP images confirms the above findings. CTA ABDOMEN AND PELVIS FINDINGS VASCULAR Aorta: Normal caliber abdominal aorta. No aortic dissection. Aortic calcifications are present. Celiac: Celiac axis is patent. No evidence of dissection or aneurysm. No significant stenosis. SMA: There is a stent in the origin of the superior mesenteric artery. The stent is thrombosed with proximal refilling of the SMA after the thrombosed segment. SMA thrombosis is  unchanged since the previous study. Renals: Single right and duplicated left renal arteries are patent. Renal nephrograms are symmetrical. IMA: Inferior mesenteric artery is patent. Inflow: Calcification at the origin of the right common iliac artery but remains patent. No aneurysm or dissection. Veins: No obvious venous abnormality within the limitations of this arterial phase study. Review of the MIP images confirms the above findings. NON-VASCULAR Hepatobiliary: Surgical absence of the gallbladder. No focal liver lesion. Pancreas: Unremarkable. No pancreatic ductal dilatation or surrounding inflammatory changes. Spleen: Normal in size without focal abnormality. Adrenals/Urinary Tract: Adrenal glands are unremarkable. Kidneys are normal, without renal calculi, focal lesion, or hydronephrosis. Bladder is unremarkable. Stomach/Bowel: There is a large ventral abdominal wall hernia containing multiple small bowel loops. No proximal obstruction. Stomach, small bowel, and colon are not abnormally distended. No significant wall thickening. Colon is diffusely stool-filled without distention. Appendix is not identified. Lymphatic: No significant lymphadenopathy. Reproductive: Uterus is surgically absent. No abnormal adnexal masses. Other: No free air or free fluid in the abdomen or pelvis. Musculoskeletal: No destructive bone lesions. Review of the MIP images confirms the above findings. IMPRESSION: 1. No evidence of dissection or aneurysm involving the thoracic or abdominal aorta. 2. Aortic atherosclerosis. 3. 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 This appearance is unchanged since previous studies. 4. Large ventral abdominal wall hernia containing multiple loops of small bowel but without proximal obstruction. Electronically Signed   By: Lucienne Capers M.D.   On: 04/07/2018 23:51    EKG:   Orders placed or performed during the hospital  encounter of 04/07/18  . EKG 12-Lead  . EKG 12-Lead  . ED EKG within 10 minutes  . ED EKG within 10 minutes    ASSESSMENT AND PLAN:   57 year old female with multiple medical problems including chronic mesenteric ischemia, low back pain with sciatica, hypertension, GERD, obesity presents to hospital from home secondary to altered mental status  1.  Acute encephalopathy-likely toxic encephalopathy secondary to increased intake of baclofen. -And is alert and oriented at this time. -CT of the head without  any acute findings, MRI of the brain with no acute findings. -We will get physical therapy consult.  2.  Hypertensive urgency-blood pressure elevated on presentation.  Off nicardipine drip -Start some oral medicines as does not take anything at home -Use hydralazine as needed  3.  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  4.  Hyperlipidemia-on statin  5.  GERD-on PPI  6.  DVT prophylaxis-already on Eliquis   Physical Therapy consulted    All the records are reviewed and case discussed with Care Management/Social Workerr. Management plans discussed with the patient, family and they are in agreement.  CODE STATUS: Full Code  TOTAL TIME TAKING CARE OF THIS PATIENT: 37 minutes.   POSSIBLE D/C IN 1-2 DAYS, DEPENDING ON CLINICAL CONDITION.   Gladstone Lighter M.D on 04/08/2018 at 2:25 PM  Between 7am to 6pm - Pager - [phone]  After 6pm go to [email or web address] - password [email or web address] [facility]  Office  [phone]  CC: Primary care physician; Irean Hong, MD

## 2018-04-08 NOTE — Progress Notes (Signed)
*  PRELIMINARY RESULTS* Echocardiogram 2D Echocardiogram has been performed.  Carla [name] 04/08/2018, 10:02 AM

## 2018-04-08 NOTE — Plan of Care (Signed)
  Problem: Pain Managment: Goal: General experience of comfort will improve Outcome: Progressing   Problem: Safety: Goal: Ability to remain free from injury will improve Outcome: Progressing   Admitted from ED with acute encephalopathy and HTN, BP now under control, pt however only responding to sternal rub. Daughter at bedside, external catheter in place.

## 2018-04-08 NOTE — Care Management Note (Addendum)
Case Management Note  Patient Details  Name: YAZLEEN MOLOCK MRN: [id number] Date of Birth: 1961/07/04  Subjective/Objective:   Admitted to [facility]   With the diagnosis of accelerated hypertension. Lives with significant other at the [facility].  . Daughter is at the bedside.Sees Dr. Carlene Coria in [place]. [facility] and Medication Managentment applications given  [id number],  [place] x 3 visits in the past. Rolling walker and bedside commode in the home. Takes care of all basic activities of daily living herself. Family helps with errands.                Action/Plan:   Expected Discharge Date:                  Expected Discharge Plan:     In-House Referral:     Discharge planning Services     Post Acute Care Choice:    Choice offered to:     DME Arranged:    DME Agency:     HH Arranged:    HH Agency:     Status of Service:     If discussed at [employer] of Stay Meetings, dates discussed:    Additional Comments:  Shelbie Ammons, RN MSN CCM Care Management [phone] 04/08/2018, 9:05 AM

## 2018-04-08 NOTE — Progress Notes (Signed)
Pt requesting stool softener, pt states she takes one at home because sometimes she has trouble moving her bowels, Dr. Jannifer Franklin to put in orders for colace, that's what pt takes at home.   Conley Simmonds, RN, BSN

## 2018-04-08 NOTE — ED Notes (Addendum)
Pt being transported to room 247 at this time by Judson Roch, EDT and this Probation officer.

## 2018-04-08 NOTE — H&P (Signed)
[facility] NAME: Carla [name]    MR#:  [id number]  DATE OF BIRTH:  Jun 16, 1961  DATE OF ADMISSION:  04/07/2018  PRIMARY CARE PHYSICIAN: Irean Hong, MD   REQUESTING/REFERRING PHYSICIAN: Quentin Cornwall, MD  CHIEF COMPLAINT:   Chief Complaint  Patient presents with  . Altered Mental Status  . Chest Pain    HISTORY OF PRESENT ILLNESS:  Carla [name]  is a 57 y.o. female who presents with acute encephalopathy.  Patient's family states that she is has baclofen at home, and might have taken extra baclofen, though there is no clear confirmation of this.  Patient is unable to contribute to her HPI otherwise.  Work-up in the ED is largely within normal limits upfront.  Patient was able to communicate enough intermittently with staff here to complain of some chest pain only.  She was significantly hypertensive when she came in.  Hospitalist were called for admission and further evaluation  PAST MEDICAL HISTORY:   Past Medical History:  Diagnosis Date  . Abdominal aortic atherosclerosis ([place])    a. 05/2017 CTA abd/pelvis: significant atherosclerotic dzs of the infrarenal abd Ao w/ some mural thrombus. No aneurysm or dissection.  . Abdominal pain   . Acute focal ischemia of small intestine (HCC)   . Acute right-sided low back pain with right-sided sciatica 06/13/2017  . AKI (acute kidney injury) ([place]) 07/29/2017  . Baker's cyst of knee, right    a. 07/2016 U/S: 4.1 x 1.4 x 2.9 cystic structure in R poplitetal fossa.  . Bell's palsy   . Cancer ([place])   . Chest pain    a. 08/2012 Lexiscan MV: EF 54%, non ischemia/infarct.  . Chronic idiopathic constipation   . Chronic mesenteric ischemia ([place])   . Diastolic dysfunction    a. 05/2017 Echo: Ef 60-65%, no rwma, Gr1 DD, no source of cardiac emboli.  . 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 05/2017 CTA Abd/pelvis: apparent thrombus or embolus in prox SMA (70-90%); b. 05/2017 catheter  directed tPA, mechanical thrombectomy, and stenting of the SMA.  . Essential hypertension with goal blood pressure less than 140/90 01/19/2016  . Gastroesophageal reflux disease 01/19/2016  . GERD (gastroesophageal reflux disease)   . Hyperlipidemia   . Hypertension   . Hypotension 07/29/2017  . Morbid obesity with BMI of 40.0-44.9, adult ([place])   . Occlusive mesenteric ischemia ([place]) 06/19/2017  . Personal history of other malignant neoplasm of skin 06/01/2016  . Primary osteoarthritis of both knees 06/13/2017  . Recurrent major depressive disorder ([place]) 01/19/2016  . SBO (small bowel obstruction) ([place]) 08/06/2017  . 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 Urinary tract infection 01/09/2014     PAST SURGICAL HISTORY:   Past Surgical History:  Procedure Laterality Date  . CHOLECYSTECTOMY    . LAPAROTOMY N/A 08/08/2017   Procedure: EXPLORATORY LAPAROTOMY POSSIBLE BOWEL RESECTION;  Surgeon: Jules Husbands, MD;  Location: [facility] ORS;  Service: General;  Laterality: N/A;  . TEE WITHOUT CARDIOVERSION N/A 06/22/2017   Procedure: TRANSESOPHAGEAL ECHOCARDIOGRAM (TEE);  Surgeon: Wellington Hampshire, MD;  Location: [facility] ORS;  Service: Cardiovascular;  Laterality: N/A;  . VAGINAL HYSTERECTOMY    . VISCERAL ARTERY INTERVENTION N/A 06/20/2017   Procedure: Visceral Artery Intervention, possible aortic thrombectomy;  Surgeon: Algernon Huxley, MD;  Location: [facility];  Service: Cardiovascular;  Laterality: N/A;  . VISCERAL ARTERY INTERVENTION N/A 01/28/2018   Procedure: VISCERAL ARTERY INTERVENTION;  Surgeon: Algernon Huxley, MD;  Location: [facility];  Service: Cardiovascular;  Laterality: N/A;     SOCIAL HISTORY:   Social History   Tobacco Use  . Smoking status: Former Smoker    Packs/day: 0.50    Types: Cigarettes    Last attempt to quit: 01/31/2018    Years since quitting: 0.1  . Smokeless tobacco: Never Used  Substance Use Topics  . Alcohol use: No     FAMILY HISTORY:    Family History  Problem Relation Age of Onset  . Hypertension Mother   . Heart disease Mother   . Heart attack Mother   . Breast cancer Mother   . Hypertension Father   . Breast cancer Paternal Aunt      DRUG ALLERGIES:  No Known Allergies  MEDICATIONS AT HOME:   Prior to Admission medications   Medication Sig Start Date End Date Taking? Authorizing Provider  apixaban (ELIQUIS) 5 MG TABS tablet Take 1 tablet (5 mg total) by mouth 2 (two) times daily. 01/30/18 03/01/18  Henreitta Leber, MD  aspirin EC 81 MG tablet Take 81 mg by mouth daily.     [provider]  atorvastatin (LIPITOR) 80 MG tablet Take 1 tablet (80 mg total) by mouth at bedtime. 01/30/18 03/01/18  Henreitta Leber, MD  docusate sodium (COLACE) 100 MG capsule Take 100 mg by mouth daily as needed for mild constipation (AT BEDTIME).    [provider]  DULoxetine (CYMBALTA) 30 MG capsule Take 1 capsule by mouth daily. 08/24/17   [provider]  feeding supplement, ENSURE ENLIVE, (ENSURE ENLIVE) LIQD Take 237 mLs by mouth 2 (two) times daily between meals. 01/31/18   Bettey Costa, MD  gabapentin (NEURONTIN) 300 MG capsule Take 1 capsule (300 mg total) by mouth 3 (three) times daily. 01/30/18 03/01/18  Henreitta Leber, MD  omeprazole (PRILOSEC) 20 MG capsule Take 1 capsule (20 mg total) by mouth daily. 01/30/18 03/01/18  Henreitta Leber, MD  ondansetron (ZOFRAN) 4 MG tablet Take 1 tablet (4 mg total) by mouth every 6 (six) hours as needed for nausea. 01/30/18   Henreitta Leber, MD  traMADol (ULTRAM) 50 MG tablet Take 1 tablet by mouth every 6 (six) hours as needed.    [provider]    REVIEW OF SYSTEMS:  Review of Systems  Unable to perform ROS: Acuity of condition     VITAL SIGNS:   Vitals:   04/07/18 2331 04/07/18 2343 04/07/18 2355 04/08/18 0000  BP: (!) 185/100 135/78 140/79 (!) 142/79  Pulse: 72 65 63 63  Resp: 14 14 14 14   Temp:      TempSrc:      SpO2: 98% 96% 97% 97%   Weight:       Wt Readings from Last 3 Encounters:  04/07/18 99.8 kg (220 lb)  02/13/18 98 kg (216 lb)  01/29/18 101.8 kg (224 lb 8 oz)    PHYSICAL EXAMINATION:  Physical Exam  Vitals reviewed. Constitutional: She appears well-developed and well-nourished. No distress.  HENT:  Head: Normocephalic and atraumatic.  Mouth/Throat: Oropharynx is clear and moist.  Eyes: Pupils are equal, round, and reactive to light. Conjunctivae and EOM are normal. No scleral icterus.  Neck: Normal range of motion. Neck supple. No JVD present. No thyromegaly present.  Cardiovascular: Normal rate, regular rhythm and intact distal pulses. Exam reveals no gallop and no friction rub.  No murmur heard. Respiratory: Effort normal and breath sounds normal.  No respiratory distress. She has no wheezes. She has no rales.  GI: Soft. Bowel sounds are normal. She exhibits no distension. There is no tenderness.  Musculoskeletal: Normal range of motion. She exhibits no edema.  No arthritis, no gout  Lymphadenopathy:    She has no cervical adenopathy.  Neurological:  Patient will respond and even wake up some with purposeful movement and some temple speech to painful stimuli, but otherwise is very somnolent  Skin: Skin is warm and dry. No rash noted. No erythema.  Psychiatric:  Unable to assess due to patient condition    LABORATORY PANEL:   CBC Recent Labs  Lab 04/07/18 2135  WBC 9.5  HGB 10.7*  HCT 31.0*  PLT 346   ------------------------------------------------------------------------------------------------------------------  Chemistries  Recent Labs  Lab 04/07/18 2135  NA 133*  K 3.4*  CL 100*  CO2 22  GLUCOSE 162*  BUN 14  CREATININE 0.82  CALCIUM 8.8*  AST 13*  ALT 11*  ALKPHOS 101  BILITOT 0.4   ------------------------------------------------------------------------------------------------------------------  Cardiac Enzymes Recent Labs  Lab 04/07/18 2135  TROPONINI <0.03    ------------------------------------------------------------------------------------------------------------------  RADIOLOGY:  Dg Chest 1 View  Result Date: 04/07/2018 CLINICAL DATA:  Chest pain and dizziness.  Lethargic and somnolent. EXAM: CHEST  1 VIEW COMPARISON:  08/15/2017 FINDINGS: Shallow inspiration. Mild cardiac enlargement. No vascular congestion, edema, or consolidation. Probable linear atelectasis in the lung bases. No blunting of costophrenic angles. No pneumothorax. Mediastinal contours appear intact. Degenerative changes in the spine. IMPRESSION: Shallow inspiration with linear atelectasis in the lung bases. Electronically Signed   By: Lucienne Capers M.D.   On: 04/07/2018 22:24   Ct Head Wo Contrast  Result Date: 04/07/2018 CLINICAL DATA:  Altered level consciousness EXAM: CT HEAD WITHOUT CONTRAST TECHNIQUE: Contiguous axial images were obtained from the base of the skull through the vertex without intravenous contrast. COMPARISON:  Head CT 07/23/2016 FINDINGS: Brain: No acute intracranial hemorrhage. No focal mass lesion. No CT evidence of acute infarction. No midline shift or mass effect. No hydrocephalus. Basilar cisterns are patent. Vascular: No hyperdense vessel or unexpected calcification. Skull: Normal. Negative for fracture or focal lesion. Sinuses/Orbits: Paranasal sinuses and mastoid air cells are clear. Orbits are clear. Other: None. IMPRESSION: No acute intracranial findings. Electronically Signed   By: Suzy Bouchard M.D.   On: 04/07/2018 22:23   Ct Angio Chest/abd/pel For Dissection W And/or Wo Contrast  Result Date: 04/07/2018 CLINICAL DATA:  Chest pain and dizziness. Lethargy. Hypertension. Extensive vascular history. EXAM: CT ANGIOGRAPHY CHEST, ABDOMEN AND PELVIS TECHNIQUE: Multidetector CT imaging through the chest, abdomen and pelvis was performed using the standard protocol during bolus administration of intravenous contrast. Multiplanar reconstructed images  and MIPs were obtained and reviewed to evaluate the vascular anatomy. CONTRAST:  151mL ISOVUE-370 IOPAMIDOL (ISOVUE-370) INJECTION 76% COMPARISON:  CT abdomen and pelvis 01/24/2018. CTA chest abdomen and pelvis 06/19/2017 FINDINGS: CTA CHEST FINDINGS Cardiovascular: Noncontrast CT images of the chest demonstrate scattered calcifications in the thoracic aorta and coronary arteries. No evidence of intramural hematoma in the aorta. Calcified granuloma along the crus of the diaphragm at the hiatus. Images obtained during arterial phase after intravenous injection of contrast material demonstrates good opacification of the thoracic aorta. No aortic aneurysm or dissection. Great vessel origins are patent. Normal heart size. No pericardial effusion. Central pulmonary arteries are well opacified without evidence of pulmonary embolus. Mediastinum/Nodes: Esophagus is decompressed. No significant lymphadenopathy in the chest. Thyroid gland is unremarkable. Axillary lymph nodes are not pathologically enlarged. Lungs/Pleura:  Atelectasis in the lung bases. No evidence of consolidation or edema in the lungs. No pleural effusions. No pneumothorax. Airways are patent. Musculoskeletal: No destructive bone lesions or acute fractures identified. Degenerative changes in the spine. Review of the MIP images confirms the above findings. CTA ABDOMEN AND PELVIS FINDINGS VASCULAR Aorta: Normal caliber abdominal aorta. No aortic dissection. Aortic calcifications are present. Celiac: Celiac axis is patent. No evidence of dissection or aneurysm. No significant stenosis. SMA: There is a stent in the origin of the superior mesenteric artery. The stent is thrombosed with proximal refilling of the SMA after the thrombosed segment. SMA thrombosis is unchanged since the previous study. Renals: Single right and duplicated left renal arteries are patent. Renal nephrograms are symmetrical. IMA: Inferior mesenteric artery is patent. Inflow: Calcification  at the origin of the right common iliac artery but remains patent. No aneurysm or dissection. Veins: No obvious venous abnormality within the limitations of this arterial phase study. Review of the MIP images confirms the above findings. NON-VASCULAR Hepatobiliary: Surgical absence of the gallbladder. No focal liver lesion. Pancreas: Unremarkable. No pancreatic ductal dilatation or surrounding inflammatory changes. Spleen: Normal in size without focal abnormality. Adrenals/Urinary Tract: Adrenal glands are unremarkable. Kidneys are normal, without renal calculi, focal lesion, or hydronephrosis. Bladder is unremarkable. Stomach/Bowel: There is a large ventral abdominal wall hernia containing multiple small bowel loops. No proximal obstruction. Stomach, small bowel, and colon are not abnormally distended. No significant wall thickening. Colon is diffusely stool-filled without distention. Appendix is not identified. Lymphatic: No significant lymphadenopathy. Reproductive: Uterus is surgically absent. No abnormal adnexal masses. Other: No free air or free fluid in the abdomen or pelvis. Musculoskeletal: No destructive bone lesions. Review of the MIP images confirms the above findings. IMPRESSION: 1. No evidence of dissection or aneurysm involving the thoracic or abdominal aorta. 2. Aortic atherosclerosis. 3. 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 This appearance is unchanged since previous studies. 4. Large ventral abdominal wall hernia containing multiple loops of small bowel but without proximal obstruction. Electronically Signed   By: Lucienne Capers M.D.   On: 04/07/2018 23:51    EKG:   Orders placed or performed during the hospital encounter of 04/07/18  . EKG 12-Lead  . EKG 12-Lead  . ED EKG within 10 minutes  . ED EKG within 10 minutes    IMPRESSION AND PLAN:  Principal Problem:   Accelerated hypertension -blood pressure  controlled in the ED with nicardipine.  The patient is currently off nicardipine drip.  We will keep PRN anti-hypertensives in place with blood pressure goal less than 160/100 Active Problems:   Chest pain -patient complained of this, suspect this was also potentially related to her significantly elevated blood pressure.  We will trend her cardiac enzymes and get an echocardiogram and a cardiology consult either way   Acute encephalopathy -unclear etiology at this time, perhaps the patient took extra baclofen tablets, perhaps this is something like PRES or also potentially a stroke.  MRI/MRA of her brain are pending.  Blood pressure control as above.  Further work-up and consults based on results of initial testing   Gastroesophageal reflux disease -home dose PPI   Hyperlipidemia -Home dose antilipid  Chart review performed and case discussed with ED provider. Labs, imaging and/or ECG reviewed by provider and discussed with patient/family. Management plans discussed with the patient and/or family.  DVT PROPHYLAXIS: Systemic anticoagulation  GI PROPHYLAXIS: PPI  ADMISSION STATUS: Observation  CODE STATUS: Full Code Status History    Date Active Date Inactive Code Status Order ID Comments User Context   01/24/2018 2305 01/31/2018 1915 Full Code [id number]  Lance Coon, MD Inpatient   08/06/2017 2022 08/17/2017 2158 Full Code [id number]  Florene Glen, MD ED   07/29/2017 1600 08/02/2017 1814 Full Code [id number]  Gorden Harms, MD Inpatient   06/19/2017 2019 06/28/2017 1638 Full Code [id number]  Vickie Epley, MD ED      TOTAL TIME TAKING CARE OF THIS PATIENT: 40 minutes.   Zacary Bauer [place] 04/08/2018, 12:17 AM  [facility]  Office  [phone]  CC: Primary care physician; Irean Hong, MD  Note:  This document was prepared using Dragon voice recognition software and may include unintentional dictation errors.

## 2018-04-09 DIAGNOSIS — I10 Essential (primary) hypertension: Secondary | ICD-10-CM | POA: Diagnosis not present

## 2018-04-09 MED ORDER — AMLODIPINE BESYLATE 5 MG PO TABS: 5.0000 mg | ORAL_TABLET | Freq: Every day | ORAL | 2 refills | Status: DC

## 2018-04-09 MED ORDER — POLYETHYLENE GLYCOL 3350 17 G PO PACK
17.0000 g | PACK | Freq: Every day | ORAL | Status: DC
  Administered 2018-04-09: 17 g via ORAL
  Filled 2018-04-09: qty 1

## 2018-04-09 MED ORDER — METOPROLOL TARTRATE 25 MG PO TABS: 25.0000 mg | ORAL_TABLET | Freq: Two times a day (BID) | ORAL | 2 refills | Status: DC

## 2018-04-09 MED ORDER — DOCUSATE SODIUM 100 MG PO CAPS: 100.0000 mg | ORAL_CAPSULE | Freq: Two times a day (BID) | ORAL | 1 refills | Status: DC

## 2018-04-09 MED ORDER — LACTULOSE 10 GM/15ML PO SOLN
20.0000 g | Freq: Once | ORAL | Status: DC
  Filled 2018-04-09: qty 30

## 2018-04-09 NOTE — Progress Notes (Signed)
Pt discharged home, with family vss, prescriptions provided, educated on medication regimen, and follow up appointments with discharge instructions.

## 2018-04-09 NOTE — Evaluation (Signed)
Physical Therapy Evaluation Patient Details Name: Carla [name] MRN: [id number] DOB: [birth year] Today's Date: 04/09/2018   History of Present Illness  Pt is a 57 y/o F who presented with abdominal and chest pain and acute encephalopathy.  CT head and MRI brain negative.  Suspect possible toxicity from taking extra baclofen.  Pt with accelerated hypertension in the ED 160/100.  Suspect chest pain possibly due to elevated BP.  Troponin trended and were negative.  Pt's PMH includes R LBP with R sided sciatica, AKI, baker's cyst of R knee, cancer, bell's palsy, embolus of superior mesenteric artery, SBO, osteoarthritis of Bil knees.      Clinical Impression  Pt admitted with above diagnosis. Pt currently with functional limitations due to the deficits listed below (see PT Problem List). Carla [name] denies chest pain, dizziness or lightheadedness throughout session.  She was very pleasant and agreeable to therapy.  Pt was able to complete transfers and ambulate around nurses station x2 with min guard assist for safety.  Pt demonstrates mild instability and mild sway with challenges to balance tested statically and while ambulating. HR up to 113 while ambulating.  Pt able to stand statically with eyes closed without UE support for 20 sec with mild sway but no LOB. BP in supine at start of session: 162/92.  BP in sitting after supine>sit 167/101.  BP in standing 144/97 (pt denies dizziness).  She reports an ~5 year h/o R knee pain and has not received PT for this, thus recommending OPPT at d/c to address this as well as her balance. Pt will benefit from skilled PT to increase their independence and safety with mobility to allow discharge to the venue listed below.      Follow Up Recommendations Outpatient PT(to address balance impairments and R knee pain)    Equipment Recommendations  None recommended by PT    Recommendations for Other Services       Precautions / Restrictions Precautions Precautions:  Fall;Other (comment) Precaution Comments: monitor BP Restrictions Weight Bearing Restrictions: No      Mobility  Bed Mobility Overal bed mobility: Modified Independent             General bed mobility comments: To perform supine>sit the pt uses bed rail with HOB slightly elevated.  Pt returns to supine from sitting independently.   Transfers Overall transfer level: Needs assistance Equipment used: None Transfers: Sit to/from Stand Sit to Stand: Min guard         General transfer comment: Min guard assist for safety.  Mild instability but no LOB.  Pt performs with safe technique.   Ambulation/Gait Ambulation/Gait assistance: Min guard Gait Distance (Feet): 320 Feet Assistive device: Rolling walker (2 wheeled);None Gait Pattern/deviations: Step-through pattern;Decreased stance time - left;Decreased weight shift to left;Antalgic;Wide base of support Gait velocity: decreased   General Gait Details: Wide BOS and pt with increased stance time on RLE and decreased weight shift to LLE.  Dec R knee F.  Pt ambulates 160 ft with RW and able to maintain balance with higher level balance activities. Pt then ambulated without AD x160 ft with mild instability but no LOB.  Pt's gait speed decreases with higher level balance activities while ambulating without AD.  Stairs            Wheelchair Mobility    Modified Rankin (Stroke Patients Only)       Balance Overall balance assessment: Needs assistance;History of Falls Sitting-balance support: No upper extremity supported;Feet supported Sitting balance-[name]:  Good     Standing balance support: No upper extremity supported;During functional activity Standing balance-[name]: Fair Standing balance comment: Pt able to perform static and dynamic activities without AD but would likely lose her balance with perturbation.          Rhomberg - Eyes Opened: 20(mild sway but no LOB)   High level balance activites: Head  turns;Turns;Direction changes High Level Balance Comments: Pt with mild instability and decreased gait speed when performing higher level balance activities while ambulating.              Pertinent Vitals/Pain Pain Assessment: Faces Faces Pain Scale: Hurts little more Pain Location: R knee pain with MMT and mobility Pain Descriptors / Indicators: Aching;Discomfort;Grimacing;Guarding Pain Intervention(s): Limited activity within patient's tolerance;Monitored during session    Normal expects to be discharged to:: Private residence Living Arrangements: Spouse/significant other Available Help at Discharge: Family;Available 24 hours/day Type of Home: (living at the [facility] for the past >5 years) Home Access: Level entry     Home Layout: One level Home Equipment: Environmental consultant - 2 wheels;Cane - single point;Bedside commode      Prior Function Level of Independence: Independent with assistive device(s)         Comments: Pt reports she uses RW "sometimes" and when she doesn't she typically reaches out for the wall or other objects for support.  Pt uses riding cart when in grocery but able to ambulate into store with 1 person HHA.  Pt independent with showering, dressing.  Pt reports one fall with shower transfer a few months ago.      Hand Dominance        Extremity/Trunk Assessment   Upper Extremity Assessment Upper Extremity Assessment: (Strength grossly 4/5 BUE)    Lower Extremity Assessment Lower Extremity Assessment: RLE deficits/detail;LLE deficits/detail RLE Deficits / Details: Pt reports 5 year h/o R knee pain.  Attributes this to OA saying, "that one's bone on bone".  Strength grossly 4-/5 limited by R knee pain.  LLE Deficits / Details: LLE strength grossly 4/5       Communication   Communication: No difficulties  Cognition Arousal/Alertness: Awake/alert Behavior During Therapy: WFL for tasks assessed/performed Overall Cognitive Status:  Within Functional Limits for tasks assessed                                        General Comments General comments (skin integrity, edema, etc.): Pt able to stand statically with eyes closed without UE support for 20 sec with mild sway but no LOB. BP in supine at start of session: 162/92.  BP in sitting after supine>sit 167/101.  BP in standing 144/97 (pt denies dizziness).     Exercises     Assessment/Plan    PT Assessment Patient needs continued PT services  PT Problem List Decreased strength;Decreased range of motion;Decreased activity tolerance;Decreased balance;Decreased safety awareness;Pain       PT Treatment Interventions DME instruction;Gait training;Stair training;Functional mobility training;Therapeutic activities;Therapeutic exercise;Balance training;Neuromuscular re-education;Patient/family education;Modalities    PT Goals (Current goals can be found in the Care Plan section)  Acute Rehab PT Goals Patient Stated Goal: to return home PT Goal Formulation: With patient Time For Goal Achievement: 04/23/18 Potential to Achieve Goals: Good    Frequency Min 2X/week   Barriers to discharge        Co-evaluation  AM-PAC PT "6 Clicks" Daily Activity  Outcome Measure Difficulty turning over in bed (including adjusting bedclothes, sheets and blankets)?: None Difficulty moving from lying on back to sitting on the side of the bed? : A Little Difficulty sitting down on and standing up from a chair with arms (e.g., wheelchair, bedside commode, etc,.)?: A Little Help needed moving to and from a bed to chair (including a wheelchair)?: A Little Help needed walking in hospital room?: A Little Help needed climbing 3-5 steps with a railing? : A Little 6 Click Score: 19    End of Session Equipment Utilized During Treatment: Gait belt Activity Tolerance: Patient tolerated treatment well Patient left: in bed;with call bell/phone within reach;with  bed alarm set;with family/visitor present Nurse Communication: Mobility status;Other (comment)(BP readings) PT Visit Diagnosis: Unsteadiness on feet (R26.81);Muscle weakness (generalized) (M62.81);History of falling (Z91.81);Pain Pain - Right/Left: Right Pain - part of body: Knee    Time: 3532-9924 PT Time Calculation (min) (ACUTE ONLY): 36 min   Charges:   PT Evaluation $PT Eval Low Complexity: 1 Low PT Treatments $Gait Training: 8-22 mins $Therapeutic Activity: 8-22 mins   PT G Codes:        Collie Siad PT, DPT 04/09/2018, 11:50 AM

## 2018-04-09 NOTE — Plan of Care (Signed)
  Problem: Clinical Measurements: Goal: Ability to maintain clinical measurements within normal limits will improve Outcome: Progressing   Problem: Clinical Measurements: Goal: Diagnostic test results will improve Outcome: Progressing   Problem: Activity: Goal: Risk for activity intolerance will decrease Outcome: Progressing   Problem: Pain Managment: Goal: General experience of comfort will improve Outcome: Progressing   Problem: Safety: Goal: Ability to remain free from injury will improve Outcome: Progressing   

## 2018-04-09 NOTE — Discharge Summary (Addendum)
[facility] [place] NAME: Carla [name]    MR#:  [id number]  DATE OF BIRTH:  [birth year]  DATE OF ADMISSION:  04/07/2018   ADMITTING PHYSICIAN: Lance Coon, MD  DATE OF DISCHARGE:  04/09/18  PRIMARY CARE PHYSICIAN: Irean Hong, MD   ADMISSION DIAGNOSIS:   Pain [R52] Acute encephalopathy [G93.40] Hypertensive urgency, malignant [I16.0]  DISCHARGE DIAGNOSIS:   Principal Problem:   Accelerated hypertension Active Problems:   Chest pain   Gastroesophageal reflux disease   Hyperlipidemia   Acute encephalopathy   SECONDARY DIAGNOSIS:   Past Medical History:  Diagnosis Date  . Abdominal aortic atherosclerosis ([place])    a. 05/2017 CTA abd/pelvis: significant atherosclerotic dzs of the infrarenal abd Ao w/ some mural thrombus. No aneurysm or dissection.  . Abdominal pain   . Acute focal ischemia of small intestine (HCC)   . Acute right-sided low back pain with right-sided sciatica 06/13/2017  . AKI (acute kidney injury) ([place]) 07/29/2017  . Baker's cyst of knee, right    a. 07/2016 U/S: 4.1 x 1.4 x 2.9 cystic structure in R poplitetal fossa.  . Bell's palsy   . Cancer ([place])   . Chest pain    a. 08/2012 Lexiscan MV: EF 54%, non ischemia/infarct.  . Chronic idiopathic constipation   . Chronic mesenteric ischemia ([place])   . Diastolic dysfunction    a. 05/2017 Echo: Ef 60-65%, no rwma, Gr1 DD, no source of cardiac emboli.  . 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 05/2017 CTA Abd/pelvis: apparent thrombus or embolus in prox SMA (70-90%); b. 05/2017 catheter directed tPA, mechanical thrombectomy, and stenting of the SMA.  . Essential hypertension with goal blood pressure less than 140/90 01/19/2016  . Gastroesophageal reflux disease 01/19/2016  . GERD (gastroesophageal reflux disease)   . Hyperlipidemia   . Hypertension   . Hypotension 07/29/2017  . Morbid obesity with BMI of 40.0-44.9, adult ([place])   . Occlusive mesenteric  ischemia ([place]) 06/19/2017  . Personal history of other malignant neoplasm of skin 06/01/2016  . Primary osteoarthritis of both knees 06/13/2017  . Recurrent major depressive disorder ([place]) 01/19/2016  . SBO (small bowel obstruction) ([place]) 08/06/2017  . 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 Urinary tract infection 01/09/2014    HOSPITAL COURSE:   57 year old female with multiple medical problems including chronic mesenteric ischemia, low back pain with sciatica, hypertension, GERD, obesity presents to hospital from home secondary to altered mental status  1.  Acute encephalopathy-likely toxic encephalopathy secondary to increased intake of baclofen. -CT of the head without any acute findings, MRI of the brain with no acute findings. - patient has become more alert through the hospital stay and is oriented x 3 and did mention that she took extra baclofen -Ambulated  well with physical therapy. -Counseled about not to use extra medications especially sedatives or pain medicines or muscle relaxants. -Use tramadol for pain.  Also on gabapentin and Cymbalta for chronic nerve pain  2.  Hypertensive urgency-blood pressure elevated on presentation.  Off nicardipine drip -Not on any home medications.  Being discharged on Norvasc and metoprolol  3.  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  4.  Hyperlipidemia-on statin  5.  GERD-on PPI   Physical Therapy consulted-and ambulated well in the hallway.  Did not need any walker either.  Can be discharged home today   DISCHARGE CONDITIONS:   Guarded  CONSULTS OBTAINED:  None  DRUG ALLERGIES:   No Known Allergies DISCHARGE MEDICATIONS:   Allergies as of 04/09/2018   No Known Allergies     Medication List    STOP taking these medications   feeding supplement (ENSURE ENLIVE) Liqd     TAKE these medications   amLODipine 5 MG tablet Commonly known as:  NORVASC Take 1  tablet (5 mg total) by mouth daily. Start taking on:  04/10/2018   apixaban 5 MG Tabs tablet Commonly known as:  ELIQUIS Take 1 tablet (5 mg total) by mouth 2 (two) times daily.   aspirin EC 81 MG tablet Take 81 mg by mouth daily.   atorvastatin 80 MG tablet Commonly known as:  LIPITOR Take 1 tablet (80 mg total) by mouth at bedtime.   CYMBALTA 30 MG capsule Generic drug:  DULoxetine Take 1 capsule by mouth daily.   docusate sodium 100 MG capsule Commonly known as:  COLACE Take 1 capsule (100 mg total) by mouth 2 (two) times daily. What changed:    when to take this  reasons to take this   gabapentin 300 MG capsule Commonly known as:  NEURONTIN Take 1 capsule (300 mg total) by mouth 3 (three) times daily.   metoprolol tartrate 50 MG tablet Commonly known as:  LOPRESSOR Take 50 mg by mouth 2 (two) times daily.   omeprazole 20 MG capsule Commonly known as:  PRILOSEC Take 1 capsule (20 mg total) by mouth daily.   ondansetron 4 MG tablet Commonly known as:  ZOFRAN Take 1 tablet (4 mg total) by mouth every 6 (six) hours as needed for nausea.        DISCHARGE INSTRUCTIONS:   1.  PCP follow-up in 1 to 2 weeks  DIET:   Cardiac diet  ACTIVITY:   Activity as tolerated  OXYGEN:   Home Oxygen: No.  Oxygen Delivery: room air  DISCHARGE LOCATION:   home   If you experience worsening of your admission symptoms, develop shortness of breath, life threatening emergency, suicidal or homicidal thoughts you must seek medical attention immediately by calling 911 or calling your MD immediately  if symptoms less severe.  You Must read complete instructions/literature along with all the possible adverse reactions/side effects for all the Medicines you take and that have been prescribed to you. Take any new Medicines after you have completely understood and accpet all the possible adverse reactions/side effects.   Please note  You were cared for by a hospitalist during  your hospital stay. If you have any questions about your discharge medications or the care you received while you were in the hospital after you are discharged, you can call the unit and asked to speak with the hospitalist on call if the hospitalist that took care of you is not available. Once you are discharged, your primary care physician will handle any further medical issues. Please note that NO REFILLS for any discharge medications will be authorized once you are discharged, as it is imperative that you return to your primary care physician (or establish a relationship with a primary care physician if you do not have one) for your aftercare needs so that they can reassess your need for medications and monitor your lab values.    On the day of Discharge:  VITAL SIGNS:   Blood pressure (!) 166/86, pulse 84, temperature 98.3 F (36.8 C), temperature source Oral, resp. rate 20, height 5\' 3"  (1.6 m), weight 98.2 kg (216 lb 8 oz), SpO2 96 %.  PHYSICAL EXAMINATION:   GENERAL:  56 y.o.-year-old  Obese patient lying in the bed with no acute distress.  EYES: Pupils equal, round, reactive to light and accommodation. No scleral icterus. Extraocular muscles intact.  HEENT: Head atraumatic, normocephalic. Oropharynx and nasopharynx clear.  NECK:  Supple, no jugular venous distention. No thyroid enlargement, no tenderness.  LUNGS: Normal breath sounds bilaterally, no wheezing, rales,rhonchi or crepitation. No use of accessory muscles of respiration. Decreased bibasilar breath sounds CARDIOVASCULAR: S1, S2 normal. No  rubs, or gallops. 2/6 systolic murmur present ABDOMEN: Soft, nontender, nondistended. Bowel sounds present. No organomegaly or mass.  EXTREMITIES: swollen knees- right is more tender to touch. No pedal edema, cyanosis, or clubbing.  NEUROLOGIC: Cranial nerves II through XII are intact. Muscle strength 5/5 in all extremities. Sensation intact. Gait not checked.  PSYCHIATRIC: The patient is  alert and oriented x 3.  SKIN: No obvious rash, lesion, or ulcer.     DATA REVIEW:   CBC Recent Labs  Lab 04/08/18 0307  WBC 7.7  HGB 11.1*  HCT 33.1*  PLT 357    Chemistries  Recent Labs  Lab 04/07/18 2135 04/08/18 0307  NA 133* 138  K 3.4* 4.0  CL 100* 105  CO2 22 25  GLUCOSE 162* 129*  BUN 14 12  CREATININE 0.82 0.79  CALCIUM 8.8* 9.0  AST 13*  --   ALT 11*  --   ALKPHOS 101  --   BILITOT 0.4  --      Microbiology Results  Results for orders placed or performed during the hospital encounter of 01/24/18  C difficile quick scan w PCR reflex     Status: None   Collection Time: 01/27/18  1:05 PM  Result Value Ref Range Status   C Diff antigen NEGATIVE NEGATIVE Final   C Diff toxin NEGATIVE NEGATIVE Final   C Diff interpretation No C. difficile detected.  Final    Comment: Performed at [facility], [address] [place], [place] [address]  Gastrointestinal Panel by PCR , Stool     Status: None   Collection Time: 01/27/18  1:06 PM  Result Value Ref Range Status   Campylobacter species NOT DETECTED NOT DETECTED Final   Plesimonas shigelloides NOT DETECTED NOT DETECTED Final   Salmonella species NOT DETECTED NOT DETECTED Final   Yersinia enterocolitica NOT DETECTED NOT DETECTED Final   Vibrio species NOT DETECTED NOT DETECTED Final   Vibrio cholerae NOT DETECTED NOT DETECTED Final   Enteroaggregative E coli (EAEC) NOT DETECTED NOT DETECTED Final   Enteropathogenic E coli (EPEC) NOT DETECTED NOT DETECTED Final   Enterotoxigenic E coli (ETEC) NOT DETECTED NOT DETECTED Final   Shiga like toxin producing E coli (STEC) NOT DETECTED NOT DETECTED Final   Shigella/Enteroinvasive E coli (EIEC) NOT DETECTED NOT DETECTED Final   Cryptosporidium NOT DETECTED NOT DETECTED Final   Cyclospora cayetanensis NOT DETECTED NOT DETECTED Final   Entamoeba histolytica NOT DETECTED NOT DETECTED Final   Giardia lamblia NOT DETECTED NOT DETECTED Final   Adenovirus F40/41  NOT DETECTED NOT DETECTED Final   Astrovirus NOT DETECTED NOT DETECTED Final   Norovirus GI/GII NOT DETECTED NOT DETECTED Final   Rotavirus A NOT DETECTED NOT DETECTED Final   Sapovirus (I, II, IV, and V) NOT DETECTED NOT DETECTED Final    Comment: Performed at [facility], [address] [place], [place] [address]  MRSA PCR Screening     Status: None   Collection Time: 01/27/18  3:30  PM  Result Value Ref Range Status   MRSA by PCR NEGATIVE NEGATIVE Final    Comment:        The GeneXpert MRSA Assay (FDA approved for NASAL specimens only), is one component of a comprehensive MRSA colonization surveillance program. It is not intended to diagnose MRSA infection nor to guide or monitor treatment for MRSA infections. Performed at [facility], [address], [place],  [address]     RADIOLOGY:  No results found.   Management plans discussed with the patient, family and they are in agreement.  CODE STATUS:     Code Status Orders  (From admission, onward)        Start     Ordered   04/08/18 0118  Full code  Continuous     04/08/18 0117    Code Status History    Date Active Date Inactive Code Status Order ID Comments User Context   01/24/2018 2305 01/31/2018 1915 Full Code [id number]  Lance Coon, MD Inpatient   08/06/2017 2022 08/17/2017 2158 Full Code [id number]  Florene Glen, MD ED   07/29/2017 1600 08/02/2017 1814 Full Code [id number]  Gorden Harms, MD Inpatient   06/19/2017 2019 06/28/2017 1638 Full Code [id number]  Vickie Epley, MD ED      TOTAL TIME TAKING CARE OF THIS PATIENT: 38 minutes.    Gladstone Lighter M.D on 04/09/2018 at 1:58 PM  Between 7am to 6pm - Pager - [phone]  After 6pm go to [email or web address] - Proofreader  [facility]  Office  [phone]  CC: Primary care physician; Irean Hong, MD   Note: This dictation was prepared with Dragon dictation along with smaller  phrase technology. Any transcriptional errors that result from this process are unintentional.

## 2018-04-09 NOTE — Plan of Care (Signed)
  Problem: Pain Managment: Goal: General experience of comfort will improve Outcome: Progressing   Problem: Safety: Goal: Ability to remain free from injury will improve Outcome: Progressing   

## 2018-05-13 ENCOUNTER — Other Ambulatory Visit: Payer: Self-pay

## 2018-05-13 ENCOUNTER — Emergency Department
Admission: EM | Admit: 2018-05-13 | Discharge: 2018-05-13 | Disposition: A | Discharge status: Home / Self Care | Payer: Medicare Other | Attending: Emergency Medicine | Admitting: Emergency Medicine

## 2018-05-13 ENCOUNTER — Encounter: Payer: Self-pay | Admitting: Intensive Care

## 2018-05-13 ENCOUNTER — Emergency Department: Payer: Medicare Other

## 2018-05-13 DIAGNOSIS — I10 Essential (primary) hypertension: Secondary | ICD-10-CM | POA: Diagnosis not present

## 2018-05-13 DIAGNOSIS — Z7982 Long term (current) use of aspirin: Secondary | ICD-10-CM | POA: Diagnosis not present

## 2018-05-13 DIAGNOSIS — R05 Cough: Secondary | ICD-10-CM | POA: Diagnosis present

## 2018-05-13 DIAGNOSIS — F17210 Nicotine dependence, cigarettes, uncomplicated: Secondary | ICD-10-CM | POA: Insufficient documentation

## 2018-05-13 DIAGNOSIS — J209 Acute bronchitis, unspecified: Secondary | ICD-10-CM | POA: Diagnosis not present

## 2018-05-13 DIAGNOSIS — Z79899 Other long term (current) drug therapy: Secondary | ICD-10-CM | POA: Diagnosis not present

## 2018-05-13 MED ORDER — PREDNISONE 10 MG PO TABS: ORAL_TABLET | ORAL | 0 refills | Status: DC

## 2018-05-13 MED ORDER — GUAIFENESIN-CODEINE 100-10 MG/5ML PO SOLN: 5.0000 mL | Freq: Four times a day (QID) | ORAL | 0 refills | Status: DC | PRN

## 2018-05-13 NOTE — ED Notes (Signed)
Pt c/o cough x 1 week stating "dry, hacking at night."

## 2018-05-13 NOTE — ED Provider Notes (Signed)
[facility] Emergency Department Provider Note  ____________________________________________   First MD Initiated Contact with Patient 05/13/18 1355     (approximate)  I have reviewed the triage vital signs and the nursing notes.   HISTORY  Chief Complaint Cough  HPI Carla [name] is a 56 y.o. female is here with complaint of productive cough for 1 week.  Patient denies any fever or chills.  Patient has had a history of bronchitis in the past.  She has taken over-the-counter medication without any relief.  She states that her throat is sore from either coughing or drainage.  She also endorses a frontal headache which is worse with coughing.  Patient continues to smoke but has decreased the amount.  He denies any known diagnosis of COPD.  She rates her pain as a 6/10.   Past Medical History:  Diagnosis Date  . Abdominal aortic atherosclerosis ([place])    a. 05/2017 CTA abd/pelvis: significant atherosclerotic dzs of the infrarenal abd Ao w/ some mural thrombus. No aneurysm or dissection.  . Abdominal pain   . Acute focal ischemia of small intestine (HCC)   . Acute right-sided low back pain with right-sided sciatica 06/13/2017  . AKI (acute kidney injury) ([place]) 07/29/2017  . Baker's cyst of knee, right    a. 07/2016 U/S: 4.1 x 1.4 x 2.9 cystic structure in R poplitetal fossa.  . Bell's palsy   . Cancer ([place])   . Chest pain    a. 08/2012 Lexiscan MV: EF 54%, non ischemia/infarct.  . Chronic idiopathic constipation   . Chronic mesenteric ischemia ([place])   . Diastolic dysfunction    a. 05/2017 Echo: Ef 60-65%, no rwma, Gr1 DD, no source of cardiac emboli.  . 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 05/2017 CTA Abd/pelvis: apparent thrombus or embolus in prox SMA (70-90%); b. 05/2017 catheter directed tPA, mechanical thrombectomy, and stenting of the SMA.  . Essential hypertension with goal blood pressure less than 140/90 01/19/2016  . Gastroesophageal reflux  disease 01/19/2016  . GERD (gastroesophageal reflux disease)   . Hyperlipidemia   . Hypertension   . Hypotension 07/29/2017  . Morbid obesity with BMI of 40.0-44.9, adult ([place])   . Occlusive mesenteric ischemia ([place]) 06/19/2017  . Personal history of other malignant neoplasm of skin 06/01/2016  . Primary osteoarthritis of both knees 06/13/2017  . Recurrent major depressive disorder ([place]) 01/19/2016  . SBO (small bowel obstruction) ([place]) 08/06/2017  . 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 Urinary tract infection 01/09/2014    Patient Active Problem List   Diagnosis Date Noted  . Accelerated hypertension 04/08/2018  . Acute encephalopathy 04/08/2018  . Adjustment disorder with anxiety 08/15/2017  . Chronic idiopathic constipation   . Protein-calorie malnutrition, severe 08/08/2017  . SBO (small bowel obstruction) ([place]) 08/06/2017  . Chronic mesenteric ischemia ([place])   . AKI (acute kidney injury) ([place]) 07/29/2017  . Hypotension 07/29/2017  . 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 Abdominal pain   . Occlusive mesenteric ischemia ([place]) 06/19/2017  . 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 Acute focal ischemia of small intestine (HCC)   . Acute right-sided low back pain with right-sided sciatica 06/13/2017  . Primary osteoarthritis of both knees 06/13/2017  . Pain syndrome, chronic 09/13/2016  . Morbid obesity with BMI of 40.0-44.9, adult ([place]) 07/16/2016  . Personal history of other malignant neoplasm of skin 06/01/2016  . Chronic pain of both knees 01/19/2016  .  Essential hypertension with goal blood pressure less than 140/90 01/19/2016  . Gastroesophageal reflux disease 01/19/2016  . Hyperlipidemia 01/19/2016  . Prediabetes 01/19/2016  . Recurrent major depressive disorder ([place]) 01/19/2016  . Urinary tract infection 01/09/2014  . Chronic pelvic pain in female 11/14/2013  . Mixed stress and urge urinary incontinence 11/14/2013  . Chest pain 08/06/2012     Past Surgical History:  Procedure Laterality Date  . CHOLECYSTECTOMY    . LAPAROTOMY N/A 08/08/2017   Procedure: EXPLORATORY LAPAROTOMY POSSIBLE BOWEL RESECTION;  Surgeon: Jules Husbands, MD;  Location: [facility] ORS;  Service: General;  Laterality: N/A;  . TEE WITHOUT CARDIOVERSION N/A 06/22/2017   Procedure: TRANSESOPHAGEAL ECHOCARDIOGRAM (TEE);  Surgeon: Wellington Hampshire, MD;  Location: [facility] ORS;  Service: Cardiovascular;  Laterality: N/A;  . VAGINAL HYSTERECTOMY    . VISCERAL ARTERY INTERVENTION N/A 06/20/2017   Procedure: Visceral Artery Intervention, possible aortic thrombectomy;  Surgeon: Algernon Huxley, MD;  Location: [place] CV LAB;  Service: Cardiovascular;  Laterality: N/A;  . VISCERAL ARTERY INTERVENTION N/A 01/28/2018   Procedure: VISCERAL ARTERY INTERVENTION;  Surgeon: Algernon Huxley, MD;  Location: [place] [facility] LAB;  Service: Cardiovascular;  Laterality: N/A;    Prior to Admission medications   Medication Sig Start Date End Date Taking? Authorizing Provider  amLODipine (NORVASC) 5 MG tablet Take 1 tablet (5 mg total) by mouth daily. 04/10/18   Gladstone Lighter, MD  apixaban (ELIQUIS) 5 MG TABS tablet Take 1 tablet (5 mg total) by mouth 2 (two) times daily. 01/30/18 04/09/18  Henreitta Leber, MD  aspirin EC 81 MG tablet Take 81 mg by mouth daily.     [provider]  atorvastatin (LIPITOR) 80 MG tablet Take 1 tablet (80 mg total) by mouth at bedtime. 01/30/18 04/09/18  Henreitta Leber, MD  docusate sodium (COLACE) 100 MG capsule Take 1 capsule (100 mg total) by mouth 2 (two) times daily. 04/09/18   Gladstone Lighter, MD  DULoxetine (CYMBALTA) 30 MG capsule Take 1 capsule by mouth daily. 08/24/17   [provider]  gabapentin (NEURONTIN) 300 MG capsule Take 1 capsule (300 mg total) by mouth 3 (three) times daily. 01/30/18 04/09/18  Henreitta Leber, MD  guaiFENesin-codeine 100-10 MG/5ML syrup Take 5 mLs by mouth every 6 (six) hours as needed. 05/13/18   Johnn Hai, PA-C  metoprolol tartrate (LOPRESSOR) 50 MG tablet Take 50 mg by mouth 2 (two) times daily.    [provider]  omeprazole (PRILOSEC) 20 MG capsule Take 1 capsule (20 mg total) by mouth daily. 01/30/18 04/09/18  Henreitta Leber, MD  predniSONE (DELTASONE) 10 MG tablet Take 6 tablets  today, on day 2 take 5 tablets, day 3 take 4 tablets, day 4 take 3 tablets, day 5 take  2 tablets and 1 tablet the last day 05/13/18   Johnn Hai, PA-C    Allergies Patient has no known allergies.  Family History  Problem Relation Age of Onset  . Hypertension Mother   . Heart disease Mother   . Heart attack Mother   . Breast cancer Mother   . Hypertension Father   . Breast cancer Paternal Aunt     Social History Social History   Tobacco Use  . Smoking status: Current Every Day Smoker    Packs/day: 0.50    Types: Cigarettes  . Smokeless tobacco: Never Used  Substance Use Topics  . Alcohol use: No  . Drug use: No    Review  of Systems Constitutional: No fever/chills Eyes: No visual changes. ENT: Positive sore throat. Cardiovascular: Denies chest pain. Respiratory: Denies shortness of breath.  Positive productive cough. Gastrointestinal: No abdominal pain.  No nausea, no vomiting.   Musculoskeletal: Negative for back pain. Skin: Negative for rash. Neurological: Positive for headaches, negative for focal weakness or numbness. ___________________________________________   PHYSICAL EXAM:  VITAL SIGNS: ED Triage Vitals  Enc Vitals Group     BP 05/13/18 1349 115/73     Pulse Rate 05/13/18 1349 77     Resp 05/13/18 1349 18     Temp 05/13/18 1348 98.2 F (36.8 C)     Temp Source 05/13/18 1348 Oral     SpO2 05/13/18 1349 96 %     Weight 05/13/18 1348 220 lb (99.8 kg)     Height 05/13/18 1348 5\' 3"  (1.6 m)     Head Circumference --      Peak Flow --      Pain Score 05/13/18 1348 6     Pain Loc --      Pain Edu? --      Excl. in [place]? --    Constitutional: Alert  and oriented. Well appearing and in no acute distress. Eyes: Conjunctivae are normal.  Head: Atraumatic. Nose: No congestion/rhinnorhea.  Mouth/Throat: Mucous membranes are moist.  Oropharynx non-erythematous.  Moderately injected but no exudate is noted.  Uvula is midline. Neck: No stridor.   Hematological/Lymphatic/Immunilogical: No cervical lymphadenopathy. Cardiovascular: Normal rate, regular rhythm. Grossly normal heart sounds.  Good peripheral circulation. Respiratory: Normal respiratory effort.  No retractions. Lungs CTAB. Musculoskeletal: No lower extremity tenderness nor edema.  No joint effusions. Neurologic:  Normal speech and language. No gross focal neurologic deficits are appreciated. No gait instability. Skin:  Skin is warm, dry and intact. No rash noted. Psychiatric: Mood and affect are normal. Speech and behavior are normal.  ____________________________________________   LABS (all labs ordered are listed, but only abnormal results are displayed)  Labs Reviewed - No data to display  RADIOLOGY  ED MD interpretation:  Chest x-ray is negative for pneumonia.  Official radiology report(s): Dg Chest 2 View  Result Date: 05/13/2018 CLINICAL DATA:  Cough for 1 week EXAM: CHEST - 2 VIEW COMPARISON:  04/07/2018 and prior exams FINDINGS: The cardiomediastinal silhouette is unremarkable. There is no evidence of focal airspace disease, pulmonary edema, suspicious pulmonary nodule/mass, pleural effusion, or pneumothorax. No acute bony abnormalities are identified. IMPRESSION: No active cardiopulmonary disease. Electronically Signed   By: Margarette Canada M.D.   On: 05/13/2018 14:47    ____________________________________________   PROCEDURES  Procedure(s) performed: None  Procedures  Critical Care performed: No  ____________________________________________   INITIAL IMPRESSION / ASSESSMENT AND PLAN / ED COURSE  As part of my medical decision making, I reviewed the  following data within the electronic MEDICAL RECORD NUMBER Notes from prior ED visits and [place] Controlled Substance Database  Patient was made aware that chest x-ray was negative for pneumonia.  She is encouraged to discontinue smoking.  Patient should follow-up with her PCP if any continued problems.  She was discharged with a prescription for Robitussin-AC 1 teaspoon every 6 hours as needed for cough and congestion, prednisone 60 mg 6-day taper and Tylenol/ibuprofen as needed for headache.  She is also encouraged to increase fluids.  ____________________________________________   FINAL CLINICAL IMPRESSION(S) / ED DIAGNOSES  Final diagnoses:  Acute bronchitis, unspecified organism  Cigarette smoker     ED Discharge Orders  Ordered    guaiFENesin-codeine 100-10 MG/5ML syrup  Every 6 hours PRN     05/13/18 1507    predniSONE (DELTASONE) 10 MG tablet     05/13/18 1507       Note:  This document was prepared using Dragon voice recognition software and may include unintentional dictation errors.    Johnn Hai, PA-C 05/13/18 1531    Nance Pear, MD 05/14/18 [phone]

## 2018-05-13 NOTE — ED Triage Notes (Addendum)
Patient c/o productive cough X1 week. OTC with no relief. Patient also c/o headache. Denies recent injury

## 2018-05-13 NOTE — Discharge Instructions (Addendum)
Follow-up with your doctor at [facility] or [facility] acute care if any continued problems.  Begin taking prednisone as directed over the next 6 days.  Also prescription for guaifenesin with codeine as needed for cough and congestion.  Increase fluids.  Decrease smoking.

## 2018-05-16 ENCOUNTER — Encounter: Payer: Self-pay | Admitting: Pharmacist

## 2018-06-10 ENCOUNTER — Other Ambulatory Visit: Payer: Self-pay | Admitting: Gastroenterology

## 2018-06-10 DIAGNOSIS — R1084 Generalized abdominal pain: Secondary | ICD-10-CM

## 2018-06-10 DIAGNOSIS — K559 Vascular disorder of intestine, unspecified: Secondary | ICD-10-CM

## 2018-06-14 ENCOUNTER — Other Ambulatory Visit: Payer: Self-pay

## 2018-06-14 ENCOUNTER — Emergency Department
Admission: EM | Admit: 2018-06-14 | Discharge: 2018-06-14 | Disposition: A | Discharge status: Home / Self Care | Payer: Medicare Other | Source: Home / Self Care

## 2018-06-14 ENCOUNTER — Emergency Department: Payer: Medicare Other

## 2018-06-14 ENCOUNTER — Encounter: Payer: Self-pay | Admitting: Emergency Medicine

## 2018-06-14 ENCOUNTER — Observation Stay
Admission: EM | Admit: 2018-06-14 | Discharge: 2018-06-15 | Disposition: A | Discharge status: Home / Self Care | Payer: Medicare Other | Attending: Internal Medicine | Admitting: Internal Medicine

## 2018-06-14 DIAGNOSIS — R0789 Other chest pain: Principal | ICD-10-CM | POA: Insufficient documentation

## 2018-06-14 DIAGNOSIS — E782 Mixed hyperlipidemia: Secondary | ICD-10-CM | POA: Diagnosis present

## 2018-06-14 DIAGNOSIS — I5032 Chronic diastolic (congestive) heart failure: Secondary | ICD-10-CM | POA: Insufficient documentation

## 2018-06-14 DIAGNOSIS — F17210 Nicotine dependence, cigarettes, uncomplicated: Secondary | ICD-10-CM | POA: Diagnosis not present

## 2018-06-14 DIAGNOSIS — I499 Cardiac arrhythmia, unspecified: Secondary | ICD-10-CM | POA: Insufficient documentation

## 2018-06-14 DIAGNOSIS — Z5321 Procedure and treatment not carried out due to patient leaving prior to being seen by health care provider: Secondary | ICD-10-CM | POA: Insufficient documentation

## 2018-06-14 DIAGNOSIS — I110 Hypertensive heart disease with heart failure: Secondary | ICD-10-CM | POA: Insufficient documentation

## 2018-06-14 DIAGNOSIS — Z7901 Long term (current) use of anticoagulants: Secondary | ICD-10-CM | POA: Diagnosis not present

## 2018-06-14 DIAGNOSIS — Z7982 Long term (current) use of aspirin: Secondary | ICD-10-CM | POA: Diagnosis not present

## 2018-06-14 DIAGNOSIS — I480 Paroxysmal atrial fibrillation: Secondary | ICD-10-CM | POA: Diagnosis present

## 2018-06-14 DIAGNOSIS — R079 Chest pain, unspecified: Secondary | ICD-10-CM | POA: Insufficient documentation

## 2018-06-14 DIAGNOSIS — E785 Hyperlipidemia, unspecified: Secondary | ICD-10-CM | POA: Diagnosis not present

## 2018-06-14 DIAGNOSIS — I2510 Atherosclerotic heart disease of native coronary artery without angina pectoris: Secondary | ICD-10-CM | POA: Diagnosis not present

## 2018-06-14 DIAGNOSIS — I700 Atherosclerosis of aorta: Secondary | ICD-10-CM | POA: Diagnosis not present

## 2018-06-14 DIAGNOSIS — Z6841 Body Mass Index (BMI) 40.0 and over, adult: Secondary | ICD-10-CM | POA: Insufficient documentation

## 2018-06-14 DIAGNOSIS — I739 Peripheral vascular disease, unspecified: Secondary | ICD-10-CM | POA: Insufficient documentation

## 2018-06-14 DIAGNOSIS — Z86718 Personal history of other venous thrombosis and embolism: Secondary | ICD-10-CM | POA: Diagnosis not present

## 2018-06-14 DIAGNOSIS — R0602 Shortness of breath: Secondary | ICD-10-CM | POA: Insufficient documentation

## 2018-06-14 DIAGNOSIS — I4891 Unspecified atrial fibrillation: Secondary | ICD-10-CM

## 2018-06-14 DIAGNOSIS — I10 Essential (primary) hypertension: Secondary | ICD-10-CM | POA: Diagnosis present

## 2018-06-14 DIAGNOSIS — Z79899 Other long term (current) drug therapy: Secondary | ICD-10-CM | POA: Insufficient documentation

## 2018-06-14 DIAGNOSIS — Z85828 Personal history of other malignant neoplasm of skin: Secondary | ICD-10-CM | POA: Insufficient documentation

## 2018-06-14 DIAGNOSIS — K219 Gastro-esophageal reflux disease without esophagitis: Secondary | ICD-10-CM | POA: Diagnosis present

## 2018-06-14 LAB — BASIC METABOLIC PANEL
Anion gap: 10 (ref 5–15)
BUN: 9 mg/dL (ref 6–20)
CO2: 26 mmol/L (ref 22–32)
Calcium: 8.8 mg/dL — ABNORMAL LOW (ref 8.9–10.3)
Chloride: 98 mmol/L (ref 98–111)
Creatinine, Ser: 0.91 mg/dL (ref 0.44–1.00)
GFR calc Af Amer: >60 mL/min (ref >60)
GFR calc non Af Amer: >60 mL/min (ref >60)
Glucose, Bld: 114 mg/dL — ABNORMAL HIGH (ref 70–99)
Potassium: 3.9 mmol/L (ref 3.5–5.1)
Sodium: 134 mmol/L — ABNORMAL LOW (ref 135–145)

## 2018-06-14 LAB — CBC
HCT: 31.2 % — ABNORMAL LOW (ref 35.0–47.0)
Hemoglobin: 10.8 g/dL — ABNORMAL LOW (ref 12.0–16.0)
MCH: 26.7 pg (ref 26.0–34.0)
MCHC: 34.7 g/dL (ref 32.0–36.0)
MCV: 76.9 fL — ABNORMAL LOW (ref 80.0–100.0)
Platelets: 384 10*3/uL (ref 150–440)
RBC: 4.06 MIL/uL (ref 3.80–5.20)
RDW: 18.2 % — ABNORMAL HIGH (ref 11.5–14.5)
WBC: 8.7 10*3/uL (ref 3.6–11.0)

## 2018-06-14 LAB — TROPONIN I: Troponin I: <0.03 ng/mL (ref <0.03)

## 2018-06-14 MED ORDER — ONDANSETRON HCL 4 MG/2ML IJ SOLN
4.0000 mg | Freq: Once | INTRAMUSCULAR | Status: AC
  Administered 2018-06-15: 4 mg via INTRAVENOUS
  Filled 2018-06-14: qty 2

## 2018-06-14 MED ORDER — MORPHINE SULFATE (PF) 2 MG/ML IV SOLN
2.0000 mg | Freq: Once | INTRAVENOUS | Status: AC
  Administered 2018-06-15: 2 mg via INTRAVENOUS
  Filled 2018-06-14: qty 1

## 2018-06-14 NOTE — ED Triage Notes (Signed)
First Nurse Note:  Arrives c/o worsening symptoms.  Patient is AAOx3.  Skin warm and dry.  Respirations regular and non labored.  Drinking a large cup of ice.  No SOB/ DOE.  Voice clear and strong.  NAD

## 2018-06-14 NOTE — ED Triage Notes (Signed)
Pt reports CP, intermittent SOB and feeling like her heart beat is irregular. Pt reports hx of A-fib and states sx's worsening over last few days.

## 2018-06-14 NOTE — ED Triage Notes (Signed)
Pt arrives to ED via POV from home with c/o CP. Pt was seen by her PCP earlier today, sent here for evaluation, but left "because she couldn't sit that long". Pt returns to be seen for same c/o. Pt states central CP with radiation into the right arm. Pt denies N/V/D or fever, no SHOB. Pt with h/x A-fib and recent d/x of CHF.

## 2018-06-14 NOTE — ED Notes (Signed)
AAOx3.  Skin warm and dry.  No SOB/ DOE.  Observed daughter taking patient outside.

## 2018-06-14 NOTE — ED Triage Notes (Signed)
Pt also reports severe lower back pain. Pt reports hx of arthritis in her back.

## 2018-06-14 NOTE — ED Provider Notes (Signed)
[facility] Emergency Department Provider Note   First MD Initiated Contact with Patient 06/14/18 2332     (approximate)  I have reviewed the triage vital signs and the nursing notes.   HISTORY  Chief Complaint Chest Pain   HPI Carla [name] is a 57 y.o. female with below list of chronic medical conditions including CAD, bowel ischemia DVT currently on Eliquis presents to the emergency department with persistent central chest pain with radiation to the right neck and arm x2 days.  Patient states that the pain is persistent however that there are times in the intensity increases.  Current pain score is 9 out of 10.  Patient was in the emergency department earlier this afternoon however left before being seen.  Patient returns at this time secondary to ongoing chest pain.   Past Medical History:  Diagnosis Date  . Abdominal aortic atherosclerosis ([place])    a. 05/2017 CTA abd/pelvis: significant atherosclerotic dzs of the infrarenal abd Ao w/ some mural thrombus. No aneurysm or dissection.  . Abdominal pain   . Acute focal ischemia of small intestine (HCC)   . Acute right-sided low back pain with right-sided sciatica 06/13/2017  . AKI (acute kidney injury) ([place]) 07/29/2017  . Baker's cyst of knee, right    a. 07/2016 U/S: 4.1 x 1.4 x 2.9 cystic structure in R poplitetal fossa.  . Bell's palsy   . Cancer ([place])   . Chest pain    a. 08/2012 Lexiscan MV: EF 54%, non ischemia/infarct.  . Chronic idiopathic constipation   . Chronic mesenteric ischemia ([place])   . Diastolic dysfunction    a. 05/2017 Echo: Ef 60-65%, no rwma, Gr1 DD, no source of cardiac emboli.  . 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 05/2017 CTA Abd/pelvis: apparent thrombus or embolus in prox SMA (70-90%); b. 05/2017 catheter directed tPA, mechanical thrombectomy, and stenting of the SMA.  . Essential hypertension with goal blood pressure less than 140/90 01/19/2016  . Gastroesophageal  reflux disease 01/19/2016  . GERD (gastroesophageal reflux disease)   . Hyperlipidemia   . Hypertension   . Hypotension 07/29/2017  . Morbid obesity with BMI of 40.0-44.9, adult ([place])   . Occlusive mesenteric ischemia ([place]) 06/19/2017  . Personal history of other malignant neoplasm of skin 06/01/2016  . Primary osteoarthritis of both knees 06/13/2017  . Recurrent major depressive disorder ([place]) 01/19/2016  . SBO (small bowel obstruction) ([place]) 08/06/2017  . 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 Urinary tract infection 01/09/2014    Patient Active Problem List   Diagnosis Date Noted  . PAF (paroxysmal atrial fibrillation) ([place]) 06/15/2018  . Accelerated hypertension 04/08/2018  . Acute encephalopathy 04/08/2018  . Adjustment disorder with anxiety 08/15/2017  . Chronic idiopathic constipation   . Protein-calorie malnutrition, severe 08/08/2017  . SBO (small bowel obstruction) ([place]) 08/06/2017  . Chronic mesenteric ischemia ([place])   . AKI (acute kidney injury) ([place]) 07/29/2017  . Hypotension 07/29/2017  . 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 Abdominal pain   . Occlusive mesenteric ischemia ([place]) 06/19/2017  . 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 Acute focal ischemia of small intestine (HCC)   . Acute right-sided low back pain with right-sided sciatica 06/13/2017  . Primary osteoarthritis of both knees 06/13/2017  . Pain syndrome, chronic 09/13/2016  . Morbid obesity with BMI of 40.0-44.9, adult ([place]) 07/16/2016  . Personal history of other malignant neoplasm of skin 06/01/2016  .  Chronic pain of both knees 01/19/2016  . HTN (hypertension) 01/19/2016  . Gastroesophageal reflux disease 01/19/2016  . Hyperlipidemia 01/19/2016  . Prediabetes 01/19/2016  . Recurrent major depressive disorder ([place]) 01/19/2016  . Urinary tract infection 01/09/2014  . Chronic pelvic pain in female 11/14/2013  . Mixed stress and urge urinary incontinence 11/14/2013  . Chest  pain 08/06/2012    Past Surgical History:  Procedure Laterality Date  . CHOLECYSTECTOMY    . LAPAROTOMY N/A 08/08/2017   Procedure: EXPLORATORY LAPAROTOMY POSSIBLE BOWEL RESECTION;  Surgeon: Jules Husbands, MD;  Location: [facility] ORS;  Service: General;  Laterality: N/A;  . TEE WITHOUT CARDIOVERSION N/A 06/22/2017   Procedure: TRANSESOPHAGEAL ECHOCARDIOGRAM (TEE);  Surgeon: Wellington Hampshire, MD;  Location: [facility] ORS;  Service: Cardiovascular;  Laterality: N/A;  . VAGINAL HYSTERECTOMY    . VISCERAL ARTERY INTERVENTION N/A 06/20/2017   Procedure: Visceral Artery Intervention, possible aortic thrombectomy;  Surgeon: Algernon Huxley, MD;  Location: [place] [facility];  Service: Cardiovascular;  Laterality: N/A;  . VISCERAL ARTERY INTERVENTION N/A 01/28/2018   Procedure: VISCERAL ARTERY INTERVENTION;  Surgeon: Algernon Huxley, MD;  Location: [place] [facility];  Service: Cardiovascular;  Laterality: N/A;    Prior to Admission medications   Medication Sig Start Date End Date Taking? Authorizing Provider  amLODipine (NORVASC) 5 MG tablet Take 1 tablet (5 mg total) by mouth daily. 04/10/18  Yes Gladstone Lighter, MD  apixaban (ELIQUIS) 5 MG TABS tablet Take 5 mg by mouth 2 (two) times daily.   Yes [provider]  atorvastatin (LIPITOR) 80 MG tablet Take 80 mg by mouth daily.   Yes [provider]  DULoxetine (CYMBALTA) 30 MG capsule Take 1 capsule by mouth daily. 08/24/17  Yes [provider]  metoprolol tartrate (LOPRESSOR) 25 MG tablet Take 25 mg by mouth 2 (two) times daily.    Yes [provider]  omeprazole (PRILOSEC) 20 MG capsule Take 20 mg by mouth 2 (two) times daily before a meal.   Yes [provider]  Polyethylene Glycol 3350 (PEG 3350) POWD Take 17 g by mouth 2 (two) times daily. 06/10/18  Yes [provider]  traMADol (ULTRAM) 50 MG tablet Take 50 mg by mouth 2 (two) times daily.   Yes [provider]    Allergies No known drug  allergies  Family History  Problem Relation Age of Onset  . Hypertension Mother   . Heart disease Mother   . Heart attack Mother   . Breast cancer Mother   . Hypertension Father   . Breast cancer Paternal Aunt     Social History Social History   Tobacco Use  . Smoking status: Current Every Day Smoker    Packs/day: 0.50    Types: Cigarettes  . Smokeless tobacco: Never Used  Substance Use Topics  . Alcohol use: No  . Drug use: No    Review of Systems Constitutional: No fever/chills Eyes: No visual changes. ENT: No sore throat. Cardiovascular: Positive for chest pain. Respiratory: Positive for shortness of breath. Gastrointestinal: No abdominal pain.  No nausea, no vomiting.  No diarrhea.  No constipation. Genitourinary: Negative for dysuria. Musculoskeletal: Negative for neck pain.  Negative for back pain. Integumentary: Negative for rash. Neurological: Negative for headaches, focal weakness or numbness.   ____________________________________________   PHYSICAL EXAM:  VITAL SIGNS: ED Triage Vitals  Enc Vitals Group     BP 06/14/18 2101 123/69     Pulse Rate 06/14/18 2101 81  Resp 06/14/18 2101 18     Temp 06/14/18 2101 98.9 F (37.2 C)     Temp Source 06/14/18 2101 Oral     SpO2 06/14/18 2101 96 %     Weight 06/14/18 2102 101.2 kg (223 lb 1.7 oz)     Height 06/14/18 2102 1.575 m (5\' 2" )     Head Circumference --      Peak Flow --      Pain Score 06/14/18 2102 8     Pain Loc --      Pain Edu? --      Excl. in [place]? --     Constitutional: Alert and oriented. Well appearing and in no acute distress. Eyes: Conjunctivae are normal.  Head: Atraumatic. Mouth/Throat: Mucous membranes are moist. Oropharynx non-erythematous. Neck: No stridor.   Cardiovascular: Normal rate, regular rhythm. Good peripheral circulation. Grossly normal heart sounds. Respiratory: Normal respiratory effort.  No retractions. Lungs CTAB. Gastrointestinal: Soft and nontender. No  distention.  Musculoskeletal: No lower extremity tenderness nor edema. No gross deformities of extremities. Neurologic:  Normal speech and language. No gross focal neurologic deficits are appreciated.  Skin:  Skin is warm, dry and intact. No rash noted. Psychiatric: Mood and affect are normal. Speech and behavior are normal.  ____________________________________________   LABS (all labs ordered are listed, but only abnormal results are displayed)  Labs Reviewed  TROPONIN I  TROPONIN I  TROPONIN I  BASIC METABOLIC PANEL  CBC   ____________________________________________  EKG  ED ECG REPORT I, [place] Aleece Loyd, the attending physician, personally viewed and interpreted this ECG.   Date: 06/15/2018  EKG Time: 8:56 PM  Rate: 82  Rhythm: Normal sinus rhythm  Axis: Normal  Intervals: Normal  ST&T Change: None  ____________________________________________  RADIOLOGY I, [place] Alfredo, personally viewed and evaluated these images (plain radiographs) as part of my medical decision making, as well as reviewing the written report by the radiologist.  ED MD interpretation: No acute cardiopulmonary disease  Official radiology report(s): Dg Chest 2 View  Result Date: 06/14/2018 CLINICAL DATA:  Chest pain. EXAM: CHEST - 2 VIEW COMPARISON:  Radiographs of May 13, 2018. FINDINGS: The heart size and mediastinal contours are within normal limits. Both lungs are clear. No pneumothorax or pleural effusion is noted. The visualized skeletal structures are unremarkable. IMPRESSION: No active cardiopulmonary disease. Electronically Signed   By: Marijo Conception, M.D.   On: 06/14/2018 13:08      Procedures   ____________________________________________   INITIAL IMPRESSION / ASSESSMENT AND PLAN / ED COURSE  As part of my medical decision making, I reviewed the following data within the [facility]   The 57 year old female presenting to the emergency department with  above-stated history and physical exam secondary to chest pain concern for possible ACS and as such EKG was performed which revealed no evidence of ischemia or infarction laboratory data obtained including troponin which was negative however patient admits to ongoing 9 out of 10 chest pain.  As such patient discussed with Dr. Jannifer Franklin for hospital admission further evaluation and management ____________________________________________  FINAL CLINICAL IMPRESSION(S) / ED DIAGNOSES  Final diagnoses:  Chest pain, unspecified type     MEDICATIONS GIVEN DURING THIS VISIT:  Medications  aspirin EC tablet 81 mg (has no administration in time range)  amLODipine (NORVASC) tablet 5 mg (has no administration in time range)  atorvastatin (LIPITOR) tablet 80 mg (80 mg Oral Given 06/15/18 0214)  metoprolol tartrate (LOPRESSOR) tablet 50 mg (  50 mg Oral Given 06/15/18 0214)  DULoxetine (CYMBALTA) DR capsule 30 mg (has no administration in time range)  pantoprazole (PROTONIX) EC tablet 40 mg (has no administration in time range)  apixaban (ELIQUIS) tablet 5 mg (5 mg Oral Given 06/15/18 0214)  acetaminophen (TYLENOL) tablet 650 mg (has no administration in time range)    Or  acetaminophen (TYLENOL) suppository 650 mg (has no administration in time range)  oxyCODONE (Oxy IR/ROXICODONE) immediate release tablet 5 mg (5 mg Oral Given 06/15/18 0214)  ondansetron (ZOFRAN) tablet 4 mg (has no administration in time range)    Or  ondansetron (ZOFRAN) injection 4 mg (has no administration in time range)  morphine 2 MG/ML injection 2 mg (has no administration in time range)  nitroGLYCERIN (NITROSTAT) SL tablet 0.4 mg (0.4 mg Sublingual Given 06/15/18 0145)  morphine 2 MG/ML injection 2 mg (2 mg Intravenous Given 06/15/18 0030)  ondansetron (ZOFRAN) injection 4 mg (4 mg Intravenous Given 06/15/18 0030)  aspirin chewable tablet 324 mg (324 mg Oral Given 06/15/18 0132)     ED Discharge Orders    None       Note:   This document was prepared using Dragon voice recognition software and may include unintentional dictation errors.    Gregor Hams, MD 06/15/18 [phone]

## 2018-06-15 ENCOUNTER — Other Ambulatory Visit: Payer: Self-pay

## 2018-06-15 ENCOUNTER — Observation Stay
Admit: 2018-06-15 | Discharge: 2018-06-15 | Disposition: A | Discharge status: Home / Self Care | Payer: Medicare Other | Attending: Internal Medicine | Admitting: Internal Medicine

## 2018-06-15 DIAGNOSIS — I480 Paroxysmal atrial fibrillation: Secondary | ICD-10-CM | POA: Diagnosis present

## 2018-06-15 DIAGNOSIS — R0789 Other chest pain: Secondary | ICD-10-CM | POA: Diagnosis not present

## 2018-06-15 LAB — BASIC METABOLIC PANEL
Anion gap: 6 (ref 5–15)
BUN: 9 mg/dL (ref 6–20)
CO2: 28 mmol/L (ref 22–32)
Calcium: 8.5 mg/dL — ABNORMAL LOW (ref 8.9–10.3)
Chloride: 104 mmol/L (ref 98–111)
Creatinine, Ser: 0.88 mg/dL (ref 0.44–1.00)
GFR calc Af Amer: >60 mL/min (ref >60)
GFR calc non Af Amer: >60 mL/min (ref >60)
Glucose, Bld: 98 mg/dL (ref 70–99)
Potassium: 4 mmol/L (ref 3.5–5.1)
Sodium: 138 mmol/L (ref 135–145)

## 2018-06-15 LAB — TROPONIN I
Troponin I: <0.03 ng/mL (ref <0.03)
Troponin I: <0.03 ng/mL (ref <0.03)

## 2018-06-15 LAB — ECHOCARDIOGRAM COMPLETE
Height: 62 in
Weight: 3585.6 oz

## 2018-06-15 LAB — CBC
HCT: 29.8 % — ABNORMAL LOW (ref 35.0–47.0)
Hemoglobin: 10 g/dL — ABNORMAL LOW (ref 12.0–16.0)
MCH: 26 pg (ref 26.0–34.0)
MCHC: 33.5 g/dL (ref 32.0–36.0)
MCV: 77.6 fL — ABNORMAL LOW (ref 80.0–100.0)
Platelets: 350 10*3/uL (ref 150–440)
RBC: 3.83 MIL/uL (ref 3.80–5.20)
RDW: 18 % — ABNORMAL HIGH (ref 11.5–14.5)
WBC: 7.8 10*3/uL (ref 3.6–11.0)

## 2018-06-15 MED ORDER — AMLODIPINE BESYLATE 5 MG PO TABS
5.0000 mg | ORAL_TABLET | Freq: Every day | ORAL | Status: DC
  Administered 2018-06-15: 5 mg via ORAL
  Filled 2018-06-15: qty 1

## 2018-06-15 MED ORDER — ATORVASTATIN CALCIUM 20 MG PO TABS
80.0000 mg | ORAL_TABLET | Freq: Every day | ORAL | Status: DC
  Administered 2018-06-15: 80 mg via ORAL
  Filled 2018-06-15: qty 4

## 2018-06-15 MED ORDER — ONDANSETRON HCL 4 MG PO TABS: 4.0000 mg | ORAL_TABLET | Freq: Four times a day (QID) | ORAL | Status: DC | PRN

## 2018-06-15 MED ORDER — APIXABAN 5 MG PO TABS
5.0000 mg | ORAL_TABLET | Freq: Two times a day (BID) | ORAL | Status: DC
  Administered 2018-06-15 (×2): 5 mg via ORAL
  Filled 2018-06-15 (×2): qty 1

## 2018-06-15 MED ORDER — MORPHINE SULFATE (PF) 2 MG/ML IV SOLN: 2.0000 mg | INTRAVENOUS | Status: DC | PRN

## 2018-06-15 MED ORDER — METOPROLOL TARTRATE 50 MG PO TABS: 50.0000 mg | ORAL_TABLET | Freq: Two times a day (BID) | ORAL | 0 refills | Status: DC

## 2018-06-15 MED ORDER — NITROGLYCERIN 0.4 MG SL SUBL
0.4000 mg | SUBLINGUAL_TABLET | SUBLINGUAL | Status: DC | PRN
  Administered 2018-06-15: 0.4 mg via SUBLINGUAL
  Filled 2018-06-15: qty 1

## 2018-06-15 MED ORDER — OXYCODONE HCL 5 MG PO TABS
5.0000 mg | ORAL_TABLET | ORAL | Status: DC | PRN
  Administered 2018-06-15 (×2): 5 mg via ORAL
  Filled 2018-06-15 (×2): qty 1

## 2018-06-15 MED ORDER — ONDANSETRON HCL 4 MG/2ML IJ SOLN: 4.0000 mg | Freq: Four times a day (QID) | INTRAMUSCULAR | Status: DC | PRN

## 2018-06-15 MED ORDER — ACETAMINOPHEN 325 MG PO TABS: 650.0000 mg | ORAL_TABLET | Freq: Four times a day (QID) | ORAL | Status: DC | PRN

## 2018-06-15 MED ORDER — ASPIRIN EC 81 MG PO TBEC
81.0000 mg | DELAYED_RELEASE_TABLET | Freq: Every day | ORAL | Status: DC
  Administered 2018-06-15: 81 mg via ORAL
  Filled 2018-06-15: qty 1

## 2018-06-15 MED ORDER — ACETAMINOPHEN 650 MG RE SUPP: 650.0000 mg | Freq: Four times a day (QID) | RECTAL | Status: DC | PRN

## 2018-06-15 MED ORDER — ASPIRIN 81 MG PO CHEW
324.0000 mg | CHEWABLE_TABLET | Freq: Once | ORAL | Status: AC
  Administered 2018-06-15: 324 mg via ORAL
  Filled 2018-06-15: qty 4

## 2018-06-15 MED ORDER — METOPROLOL TARTRATE 50 MG PO TABS
50.0000 mg | ORAL_TABLET | Freq: Two times a day (BID) | ORAL | Status: DC
  Administered 2018-06-15 (×2): 50 mg via ORAL
  Filled 2018-06-15 (×2): qty 1

## 2018-06-15 MED ORDER — DULOXETINE HCL 30 MG PO CPEP
30.0000 mg | ORAL_CAPSULE | Freq: Every day | ORAL | Status: DC
  Administered 2018-06-15: 30 mg via ORAL
  Filled 2018-06-15: qty 1

## 2018-06-15 MED ORDER — PANTOPRAZOLE SODIUM 40 MG PO TBEC
40.0000 mg | DELAYED_RELEASE_TABLET | Freq: Every day | ORAL | Status: DC
  Administered 2018-06-15: 40 mg via ORAL
  Filled 2018-06-15: qty 1

## 2018-06-15 NOTE — Plan of Care (Signed)

## 2018-06-15 NOTE — H&P (Signed)
[facility] NAME: Carla [name]    MR#:  [id number]  DATE OF BIRTH:  December 15, 1960  DATE OF ADMISSION:  06/14/2018  PRIMARY CARE PHYSICIAN: Irean Hong, MD   REQUESTING/REFERRING PHYSICIAN: Owens Shark, MD  CHIEF COMPLAINT:   Chief Complaint  Patient presents with  . Chest Pain    HISTORY OF PRESENT ILLNESS:  Carla [name]  is a 57 y.o. female who presents with chief complaint as above.  Patient states that 2 days ago she had an episode of significant central chest pain which radiated to her right arm, to her right jaw, and to her back.  This occurred while she was at rest in her car.  It was associated with significant shortness of breath and some diaphoresis.  She went home and took some aspirin and the pain eased off.  However, it is come back intermittently since then.  She came to the ED today for evaluation and her initial work-up is largely within normal limits, but her pain is persistent.  Hospitalist were called for admission and further evaluation  PAST MEDICAL HISTORY:   Past Medical History:  Diagnosis Date  . Abdominal aortic atherosclerosis ([place])    a. 05/2017 CTA abd/pelvis: significant atherosclerotic dzs of the infrarenal abd Ao w/ some mural thrombus. No aneurysm or dissection.  . Abdominal pain   . Acute focal ischemia of small intestine (HCC)   . Acute right-sided low back pain with right-sided sciatica 06/13/2017  . AKI (acute kidney injury) ([place]) 07/29/2017  . Baker's cyst of knee, right    a. 07/2016 U/S: 4.1 x 1.4 x 2.9 cystic structure in R poplitetal fossa.  . Bell's palsy   . Cancer ([place])   . Chest pain    a. 08/2012 Lexiscan MV: EF 54%, non ischemia/infarct.  . Chronic idiopathic constipation   . Chronic mesenteric ischemia ([place])   . Diastolic dysfunction    a. 05/2017 Echo: Ef 60-65%, no rwma, Gr1 DD, no source of cardiac emboli.  . 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 05/2017 CTA  Abd/pelvis: apparent thrombus or embolus in prox SMA (70-90%); b. 05/2017 catheter directed tPA, mechanical thrombectomy, and stenting of the SMA.  . Essential hypertension with goal blood pressure less than 140/90 01/19/2016  . Gastroesophageal reflux disease 01/19/2016  . GERD (gastroesophageal reflux disease)   . Hyperlipidemia   . Hypertension   . Hypotension 07/29/2017  . Morbid obesity with BMI of 40.0-44.9, adult ([place])   . Occlusive mesenteric ischemia ([place]) 06/19/2017  . Personal history of other malignant neoplasm of skin 06/01/2016  . Primary osteoarthritis of both knees 06/13/2017  . Recurrent major depressive disorder ([place]) 01/19/2016  . SBO (small bowel obstruction) ([place]) 08/06/2017  . 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 Urinary tract infection 01/09/2014     PAST SURGICAL HISTORY:   Past Surgical History:  Procedure Laterality Date  . CHOLECYSTECTOMY    . LAPAROTOMY N/A 08/08/2017   Procedure: EXPLORATORY LAPAROTOMY POSSIBLE BOWEL RESECTION;  Surgeon: Jules Husbands, MD;  Location: [facility] ORS;  Service: General;  Laterality: N/A;  . TEE WITHOUT CARDIOVERSION N/A 06/22/2017   Procedure: TRANSESOPHAGEAL ECHOCARDIOGRAM (TEE);  Surgeon: Wellington Hampshire, MD;  Location: [facility] ORS;  Service: Cardiovascular;  Laterality: N/A;  . VAGINAL HYSTERECTOMY    . VISCERAL ARTERY INTERVENTION N/A 06/20/2017   Procedure: Visceral Artery Intervention, possible aortic thrombectomy;  Surgeon: Algernon Huxley, MD;  Location: [place]  CV LAB;  Service: Cardiovascular;  Laterality: N/A;  . VISCERAL ARTERY INTERVENTION N/A 01/28/2018   Procedure: VISCERAL ARTERY INTERVENTION;  Surgeon: Algernon Huxley, MD;  Location: [place] [facility];  Service: Cardiovascular;  Laterality: N/A;     SOCIAL HISTORY:   Social History   Tobacco Use  . Smoking status: Current Every Day Smoker    Packs/day: 0.50    Types: Cigarettes  . Smokeless tobacco: Never Used  Substance Use Topics  . Alcohol use: No      FAMILY HISTORY:   Family History  Problem Relation Age of Onset  . Hypertension Mother   . Heart disease Mother   . Heart attack Mother   . Breast cancer Mother   . Hypertension Father   . Breast cancer Paternal Aunt      DRUG ALLERGIES:  No Known Allergies  MEDICATIONS AT HOME:   Prior to Admission medications   Medication Sig Start Date End Date Taking? Authorizing Provider  amLODipine (NORVASC) 5 MG tablet Take 1 tablet (5 mg total) by mouth daily. 04/10/18   Gladstone Lighter, MD  apixaban (ELIQUIS) 5 MG TABS tablet Take 1 tablet (5 mg total) by mouth 2 (two) times daily. 01/30/18 04/09/18  Henreitta Leber, MD  aspirin EC 81 MG tablet Take 81 mg by mouth daily.     [provider]  atorvastatin (LIPITOR) 80 MG tablet Take 1 tablet (80 mg total) by mouth at bedtime. 01/30/18 04/09/18  Henreitta Leber, MD  docusate sodium (COLACE) 100 MG capsule Take 1 capsule (100 mg total) by mouth 2 (two) times daily. 04/09/18   Gladstone Lighter, MD  DULoxetine (CYMBALTA) 30 MG capsule Take 1 capsule by mouth daily. 08/24/17   [provider]  gabapentin (NEURONTIN) 300 MG capsule Take 1 capsule (300 mg total) by mouth 3 (three) times daily. 01/30/18 04/09/18  Henreitta Leber, MD  guaiFENesin-codeine 100-10 MG/5ML syrup Take 5 mLs by mouth every 6 (six) hours as needed. 05/13/18   Johnn Hai, PA-C  metoprolol tartrate (LOPRESSOR) 50 MG tablet Take 50 mg by mouth 2 (two) times daily.    [provider]  omeprazole (PRILOSEC) 20 MG capsule Take 1 capsule (20 mg total) by mouth daily. 01/30/18 04/09/18  Henreitta Leber, MD  predniSONE (DELTASONE) 10 MG tablet Take 6 tablets  today, on day 2 take 5 tablets, day 3 take 4 tablets, day 4 take 3 tablets, day 5 take  2 tablets and 1 tablet the last day 05/13/18   Johnn Hai, PA-C    REVIEW OF SYSTEMS:  Review of Systems  Constitutional: Positive for diaphoresis. Negative for chills, fever, malaise/fatigue and  weight loss.  HENT: Negative for ear pain, hearing loss and tinnitus.   Eyes: Negative for blurred vision, double vision, pain and redness.  Respiratory: Positive for shortness of breath. Negative for cough and hemoptysis.   Cardiovascular: Positive for chest pain. Negative for palpitations, orthopnea and leg swelling.  Gastrointestinal: Negative for abdominal pain, constipation, diarrhea, nausea and vomiting.  Genitourinary: Negative for dysuria, frequency and hematuria.  Musculoskeletal: Negative for back pain, joint pain and neck pain.  Skin:       No acne, rash, or lesions  Neurological: Negative for dizziness, tremors, focal weakness and weakness.  Endo/Heme/Allergies: Negative for polydipsia. Does not bruise/bleed easily.  Psychiatric/Behavioral: Negative for depression. The patient is not nervous/anxious and does not have insomnia.      VITAL SIGNS:   Vitals:  06/14/18 2101 06/14/18 2102 06/14/18 2357  BP: 123/69  120/73  Pulse: 81  69  Resp: 18  18  Temp: 98.9 F (37.2 C)    TempSrc: Oral    SpO2: 96%  96%  Weight:  101.2 kg   Height:  5\' 2"  (1.575 m)    Wt Readings from Last 3 Encounters:  06/14/18 101.2 kg  06/14/18 101.2 kg  05/13/18 99.8 kg    PHYSICAL EXAMINATION:  Physical Exam  Vitals reviewed. Constitutional: She is oriented to person, place, and time. She appears well-developed and well-nourished. No distress.  HENT:  Head: Normocephalic and atraumatic.  Mouth/Throat: Oropharynx is clear and moist.  Eyes: Pupils are equal, round, and reactive to light. Conjunctivae and EOM are normal. No scleral icterus.  Neck: Normal range of motion. Neck supple. No JVD present. No thyromegaly present.  Cardiovascular: Normal rate, regular rhythm and intact distal pulses. Exam reveals no gallop and no friction rub.  No murmur heard. Respiratory: Effort normal and breath sounds normal. No respiratory distress. She has no wheezes. She has no rales.  GI: Soft. Bowel  sounds are normal. She exhibits no distension. There is no tenderness.  Musculoskeletal: Normal range of motion. She exhibits no edema.  No arthritis, no gout  Lymphadenopathy:    She has no cervical adenopathy.  Neurological: She is alert and oriented to person, place, and time. No cranial nerve deficit.  No dysarthria, no aphasia  Skin: Skin is warm and dry. No rash noted. No erythema.  Psychiatric: She has a normal mood and affect. Her behavior is normal. Judgment and thought content normal.    LABORATORY PANEL:   CBC Recent Labs  Lab 06/14/18 1248  WBC 8.7  HGB 10.8*  HCT 31.2*  PLT 384   ------------------------------------------------------------------------------------------------------------------  Chemistries  Recent Labs  Lab 06/14/18 1248  NA 134*  K 3.9  CL 98  CO2 26  GLUCOSE 114*  BUN 9  CREATININE 0.91  CALCIUM 8.8*   ------------------------------------------------------------------------------------------------------------------  Cardiac Enzymes Recent Labs  Lab 06/14/18 1248  TROPONINI <0.03   ------------------------------------------------------------------------------------------------------------------  RADIOLOGY:  Dg Chest 2 View  Result Date: 06/14/2018 CLINICAL DATA:  Chest pain. EXAM: CHEST - 2 VIEW COMPARISON:  Radiographs of May 13, 2018. FINDINGS: The heart size and mediastinal contours are within normal limits. Both lungs are clear. No pneumothorax or pleural effusion is noted. The visualized skeletal structures are unremarkable. IMPRESSION: No active cardiopulmonary disease. Electronically Signed   By: Marijo Conception, M.D.   On: 06/14/2018 13:08    EKG:   Orders placed or performed during the hospital encounter of 06/14/18  . ED EKG within 10 minutes  . ED EKG within 10 minutes    IMPRESSION AND PLAN:  Principal Problem:   Chest pain -initial troponin negative, trend cardiac enzymes tonight.  Get an echocardiogram and a  cardiology consult.  Patient states that she was already set up with Dr. Clayborn Bigness her cardiologist for an outpatient stress test. Active Problems:   HTN (hypertension) -continue home meds   PAF (paroxysmal atrial fibrillation) ([place]) -continue rate controlling medication as well as anticoagulation   Gastroesophageal reflux disease -Home dose PPI   Hyperlipidemia -Home dose antilipid  Chart review performed and case discussed with ED provider. Labs, imaging and/or ECG reviewed by provider and discussed with patient/family. Management plans discussed with the patient and/or family.  DVT PROPHYLAXIS: Systemic anticoagulation  GI PROPHYLAXIS:  PPI   ADMISSION STATUS: Observation  CODE STATUS: Full Code Status  History    Date Active Date Inactive Code Status Order ID Comments User Context   04/08/2018 0117 04/09/2018 1913 Full Code [id number]  Lance Coon, MD Inpatient   01/24/2018 2305 01/31/2018 1915 Full Code [id number]  Lance Coon, MD Inpatient   08/06/2017 2022 08/17/2017 2158 Full Code [id number]  Florene Glen, MD ED   07/29/2017 1600 08/02/2017 1814 Full Code [id number]  Gorden Harms, MD Inpatient   06/19/2017 2019 06/28/2017 1638 Full Code [id number]  Vickie Epley, MD ED      TOTAL TIME TAKING CARE OF THIS PATIENT: 40 minutes.   Andrey Hoobler [place] 06/15/2018, 12:33 AM  [facility]  Office  [phone]  CC: Primary care physician; Irean Hong, MD  Note:  This document was prepared using Dragon voice recognition software and may include unintentional dictation errors.

## 2018-06-15 NOTE — Consult Note (Signed)
Cardiology Consultation Note    Patient ID: Carla [name], MRN: [id number], DOB/AGE: 04/03/1961 57 y.o. Admit date: 06/14/2018   Date of Consult: 06/15/2018 Primary Physician: Irean Hong, MD Primary Cardiologist: Dr. Clayborn Bigness  Chief Complaint: chest pain Reason for Consultation: chest pain Requesting MD: Dr. Benjie Karvonen  HPI: Carla [name] is a 57 y.o. 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  This occurred 2 days prior to presentation.  Since admission she has had no further pain.  She has not had pain similar to this in the past.  She is been compliant with her medications including apixaban at 5 mg twice daily as well as enteric-coated aspirin.  Is also on atorvastatin at 80 mg daily.  Patient is ruled out for myocardial infarction.  Troponins have been normal.  EKG sinus rhythm with nonspecific ST-T wave changes.  Echo is pending.  Past Medical History:  Diagnosis Date  . Abdominal aortic atherosclerosis ([place])    a. 05/2017 CTA abd/pelvis: significant atherosclerotic dzs of the infrarenal abd Ao w/ some mural thrombus. No aneurysm or dissection.  . Abdominal pain   . Acute focal ischemia of small intestine (HCC)   . Acute right-sided low back pain with right-sided sciatica 06/13/2017  . AKI (acute kidney injury) ([place]) 07/29/2017  . Baker's cyst of knee, right    a. 07/2016 U/S: 4.1 x 1.4 x 2.9 cystic structure in R poplitetal fossa.  . Bell's palsy   . Cancer ([place])   . Chest pain    a. 08/2012 Lexiscan MV: EF 54%, non ischemia/infarct.  . Chronic idiopathic constipation   . Chronic mesenteric ischemia ([place])   . Diastolic dysfunction    a. 05/2017 Echo: Ef 60-65%, no rwma, Gr1 DD, no source of cardiac emboli.  . 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 05/2017 CTA  Abd/pelvis: apparent thrombus or embolus in prox SMA (70-90%); b. 05/2017 catheter directed tPA, mechanical thrombectomy, and stenting of the SMA.  . Essential hypertension with goal blood pressure less than 140/90 01/19/2016  . Gastroesophageal reflux disease 01/19/2016  . GERD (gastroesophageal reflux disease)   . Hyperlipidemia   . Hypertension   . Hypotension 07/29/2017  . Morbid obesity with BMI of 40.0-44.9, adult ([place])   . Occlusive mesenteric ischemia ([place]) 06/19/2017  . Personal history of other malignant neoplasm of skin 06/01/2016  . Primary osteoarthritis of both knees 06/13/2017  . Recurrent major depressive disorder ([place]) 01/19/2016  . SBO (small bowel obstruction) ([place]) 08/06/2017  . 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 Urinary tract infection 01/09/2014      Surgical History:  Past Surgical History:  Procedure Laterality Date  . CHOLECYSTECTOMY    . LAPAROTOMY N/A 08/08/2017   Procedure: EXPLORATORY LAPAROTOMY POSSIBLE BOWEL RESECTION;  Surgeon: Jules Husbands, MD;  Location: [facility] ORS;  Service: General;  Laterality: N/A;  . TEE WITHOUT CARDIOVERSION N/A 06/22/2017   Procedure: TRANSESOPHAGEAL ECHOCARDIOGRAM (TEE);  Surgeon: Wellington Hampshire, MD;  Location: [facility] ORS;  Service: Cardiovascular;  Laterality: N/A;  . VAGINAL HYSTERECTOMY    . VISCERAL ARTERY INTERVENTION N/A 06/20/2017   Procedure: Visceral Artery Intervention, possible aortic thrombectomy;  Surgeon: Algernon Huxley, MD;  Location: [facility];  Service: Cardiovascular;  Laterality: N/A;  . VISCERAL ARTERY INTERVENTION N/A 01/28/2018  Procedure: VISCERAL ARTERY INTERVENTION;  Surgeon: Algernon Huxley, MD;  Location: [facility];  Service: Cardiovascular;  Laterality: N/A;     Home Meds: Prior to Admission medications   Medication Sig Start Date End Date Taking? Authorizing Provider  amLODipine (NORVASC) 5 MG tablet Take 1 tablet (5 mg total) by mouth daily. 04/10/18  Yes Gladstone Lighter,  MD  apixaban (ELIQUIS) 5 MG TABS tablet Take 5 mg by mouth 2 (two) times daily.   Yes [provider]  atorvastatin (LIPITOR) 80 MG tablet Take 80 mg by mouth daily.   Yes [provider]  DULoxetine (CYMBALTA) 30 MG capsule Take 1 capsule by mouth daily. 08/24/17  Yes [provider]  metoprolol tartrate (LOPRESSOR) 25 MG tablet Take 25 mg by mouth 2 (two) times daily.    Yes [provider]  omeprazole (PRILOSEC) 20 MG capsule Take 20 mg by mouth 2 (two) times daily before a meal.   Yes [provider]  Polyethylene Glycol 3350 (PEG 3350) POWD Take 17 g by mouth 2 (two) times daily. 06/10/18  Yes [provider]  traMADol (ULTRAM) 50 MG tablet Take 50 mg by mouth 2 (two) times daily.   Yes [provider]    Inpatient Medications:  . amLODipine  5 mg Oral Daily  . apixaban  5 mg Oral BID  . aspirin EC  81 mg Oral Daily  . atorvastatin  80 mg Oral QHS  . DULoxetine  30 mg Oral Daily  . metoprolol tartrate  50 mg Oral BID  . pantoprazole  40 mg Oral Daily     Allergies: No Known Allergies  Social History   Socioeconomic History  . Marital status: Single    Spouse name: Not on file  . Number of children: Not on file  . Years of education: Not on file  . Highest education level: Not on file  Occupational History  . Occupation: Unemployed  Social Needs  . Financial resource strain: Not on file  . Food insecurity:    Worry: Not on file    Inability: Not on file  . Transportation needs:    Medical: Not on file    Non-medical: Not on file  Tobacco Use  . Smoking status: Current Every Day Smoker    Packs/day: 0.50    Types: Cigarettes  . Smokeless tobacco: Never Used  Substance and Sexual Activity  . Alcohol use: No  . Drug use: No  . Sexual activity: Never  Lifestyle  . Physical activity:    Days per week: Not on file    Minutes per session: Not on file  . Stress: Not on file  Relationships  . Social  connections:    Talks on phone: Not on file    Gets together: Not on file    Attends religious service: Not on file    Active member of club or organization: Not on file    Attends meetings of clubs or organizations: Not on file    Relationship status: Not on file  . Intimate partner violence:    Fear of current or ex partner: Not on file    Emotionally abused: Not on file    Physically abused: Not on file    Forced sexual activity: Not on file  Other Topics Concern  . Not on file  Social History Narrative   Lives in [place] with boyfriend.  Does not routinely exercise.     Family History  Problem Relation  Age of Onset  . Hypertension Mother   . Heart disease Mother   . Heart attack Mother   . Breast cancer Mother   . Hypertension Father   . Breast cancer Paternal Aunt      Review of Systems: A 12-system review of systems was performed and is negative except as noted in the HPI.  Labs: Recent Labs    06/14/18 1248 06/15/18 0159 06/15/18 0735  TROPONINI <0.03 <0.03 <0.03   Lab Results  Component Value Date   WBC 7.8 06/15/2018   HGB 10.0 (L) 06/15/2018   HCT 29.8 (L) 06/15/2018   MCV 77.6 (L) 06/15/2018   PLT 350 06/15/2018    Recent Labs  Lab 06/15/18 0735  NA 138  K 4.0  CL 104  CO2 28  BUN 9  CREATININE 0.88  CALCIUM 8.5*  GLUCOSE 98   Lab Results  Component Value Date   CHOL 304 (H) 07/22/2012   HDL 51 07/22/2012   LDLCALC 191 (H) 07/22/2012   TRIG 167 (H) 08/13/2017   No results found for: DDIMER  Radiology/Studies:  Dg Chest 2 View  Result Date: 06/14/2018 CLINICAL DATA:  Chest pain. EXAM: CHEST - 2 VIEW COMPARISON:  Radiographs of May 13, 2018. FINDINGS: The heart size and mediastinal contours are within normal limits. Both lungs are clear. No pneumothorax or pleural effusion is noted. The visualized skeletal structures are unremarkable. IMPRESSION: No active cardiopulmonary disease. Electronically Signed   By: Marijo Conception, M.D.   On:  06/14/2018 13:08    Wt Readings from Last 3 Encounters:  06/15/18 101.7 kg  06/14/18 101.2 kg  05/13/18 99.8 kg    EKG: This rhythm with nonspecific ST-T wave changes  Physical Exam: 57 year old female with no current chest pain. Blood pressure 118/61, pulse 61, temperature 98 F (36.7 C), temperature source Oral, resp. rate 18, height 5\' 2"  (1.575 m), weight 101.7 kg, SpO2 95 %. Body mass index is 40.99 kg/m. General: Well developed, well nourished, in no acute distress. Head: Normocephalic, atraumatic, sclera non-icteric, no xanthomas, nares are without discharge.  Neck: Negative for carotid bruits. JVD not elevated. Lungs: Clear bilaterally to auscultation without wheezes, rales, or rhonchi. Breathing is unlabored. Heart: RRR with S1 S2. No murmurs, rubs, or gallops appreciated. Abdomen: Soft, non-tender, non-distended with normoactive bowel sounds. No hepatomegaly. No rebound/guarding. No obvious abdominal masses. Msk:  Strength and tone appear normal for age. Extremities: No clubbing or cyanosis. No edema.  Distal pedal pulses are 2+ and equal bilaterally. Neuro: Alert and oriented X 3. No facial asymmetry. No focal deficit. Moves all extremities spontaneously. Psych:  Responds to questions appropriately with a normal affect.     Assessment and Plan  57 year old female with history of peripheral vascular disease, with history of preserved LV function by echo most recent one done 2 months ago ejection fraction of 60 to 65% no significant valvular disease admitted with chest pain with atypical features.  She is ruled out for myocardial infarction.  Her electrocardiogram has been normal.  Echo is pending.  We will compared to one done 2 months ago.  If there is no appreciable change, would consider discharge to home with outpatient follow-up with Dr. Clayborn Bigness.  Would continue with apixaban given her history of mesenteric infarct and peripheral vascular disease.  Symptoms do not appear  to be acutely ischemic.  Would discharge on current medications with follow-up in 1 week.  Signed, Teodoro Spray MD 06/15/2018, 9:58 AM Pager: [phone]

## 2018-06-15 NOTE — Discharge Summary (Signed)
[facility] [place] NAME: Carla [name]    MR#:  [id number]  DATE OF BIRTH:  Oct 30, 1960  DATE OF ADMISSION:  06/14/2018 ADMITTING PHYSICIAN: Lance Coon, MD  DATE OF DISCHARGE: 06/15/2018  PRIMARY CARE PHYSICIAN: Irean Hong, MD    ADMISSION DIAGNOSIS:  Chest pain, unspecified type [R07.9]  DISCHARGE DIAGNOSIS:  Principal Problem:   Chest pain Active Problems:   HTN (hypertension)   Gastroesophageal reflux disease   Hyperlipidemia   PAF (paroxysmal atrial fibrillation) ([place])   SECONDARY DIAGNOSIS:   Past Medical History:  Diagnosis Date  . Abdominal aortic atherosclerosis ([place])    a. 05/2017 CTA abd/pelvis: significant atherosclerotic dzs of the infrarenal abd Ao w/ some mural thrombus. No aneurysm or dissection.  . Abdominal pain   . Acute focal ischemia of small intestine (HCC)   . Acute right-sided low back pain with right-sided sciatica 06/13/2017  . AKI (acute kidney injury) ([place]) 07/29/2017  . Baker's cyst of knee, right    a. 07/2016 U/S: 4.1 x 1.4 x 2.9 cystic structure in R poplitetal fossa.  . Bell's palsy   . Cancer ([place])   . Chest pain    a. 08/2012 Lexiscan MV: EF 54%, non ischemia/infarct.  . Chronic idiopathic constipation   . Chronic mesenteric ischemia ([place])   . Diastolic dysfunction    a. 05/2017 Echo: Ef 60-65%, no rwma, Gr1 DD, no source of cardiac emboli.  . 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 05/2017 CTA Abd/pelvis: apparent thrombus or embolus in prox SMA (70-90%); b. 05/2017 catheter directed tPA, mechanical thrombectomy, and stenting of the SMA.  . Essential hypertension with goal blood pressure less than 140/90 01/19/2016  . Gastroesophageal reflux disease 01/19/2016  . GERD (gastroesophageal reflux disease)   . Hyperlipidemia   . Hypertension   . Hypotension 07/29/2017  . Morbid obesity with BMI of 40.0-44.9, adult ([place])   . Occlusive mesenteric ischemia ([place]) 06/19/2017  . Personal  history of other malignant neoplasm of skin 06/01/2016  . Primary osteoarthritis of both knees 06/13/2017  . Recurrent major depressive disorder ([place]) 01/19/2016  . SBO (small bowel obstruction) ([place]) 08/06/2017  . 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 Urinary tract infection 01/09/2014    HOSPITAL COURSE:  57 year old female with history of tobacco dependence and chronic diastolic heart failure presents with atypical chest pain.  1.  Atypical chest pain: Patient was ruled out for ACS.  EKG shows normal sinus rhythm with nonspecific ST/T wave changes. Patient was eval by cardiology.  No further recommendations at this point.  Patient will follow-up with her cardiologist outpatient.  2.  History of mesenteric infarct and peripheral vascular disease currently on apixaban  3.Tobacco dependence: Patient is encouraged to quit smoking. Counseling was provided for 4 minutes.  4.  Depression: Continue Cymbalta  5.  Essential hypertension: Continue metoprolol   DISCHARGE CONDITIONS AND DIET:   Stable for discharge on regular diet  CONSULTS OBTAINED:  Treatment Team:  Teodoro Spray, MD  DRUG ALLERGIES:  No Known Allergies  DISCHARGE MEDICATIONS:   Allergies as of 06/15/2018   No Known Allergies     Medication List    TAKE these medications   amLODipine 5 MG tablet Commonly known as:  NORVASC Take 1 tablet (5 mg total) by mouth daily.   apixaban 5 MG Tabs tablet Commonly known as:  ELIQUIS Take 5 mg by mouth 2 (two) times daily.   atorvastatin 80  MG tablet Commonly known as:  LIPITOR Take 80 mg by mouth daily.   CYMBALTA 30 MG capsule Generic drug:  DULoxetine Take 1 capsule by mouth daily.   metoprolol tartrate 50 MG tablet Commonly known as:  LOPRESSOR Take 1 tablet (50 mg total) by mouth 2 (two) times daily. What changed:    medication strength  how much to take   omeprazole 20 MG capsule Commonly known as:  PRILOSEC Take 20 mg by mouth 2 (two)  times daily before a meal.   PEG 3350 Powd Take 17 g by mouth 2 (two) times daily.   traMADol 50 MG tablet Commonly known as:  ULTRAM Take 50 mg by mouth 2 (two) times daily.         Today   CHIEF COMPLAINT:   No chest pain overnight   VITAL SIGNS:  Blood pressure 118/61, pulse 61, temperature 98 F (36.7 C), temperature source Oral, resp. rate 18, height 5\' 2"  (1.575 m), weight 101.7 kg, SpO2 95 %.   REVIEW OF SYSTEMS:  Review of Systems  Constitutional: Negative.  Negative for chills, fever and malaise/fatigue.  HENT: Negative.  Negative for ear discharge, ear pain, hearing loss, nosebleeds and sore throat.   Eyes: Negative.  Negative for blurred vision and pain.  Respiratory: Negative.  Negative for cough, hemoptysis, shortness of breath and wheezing.   Cardiovascular: Negative.  Negative for chest pain, palpitations and leg swelling.  Gastrointestinal: Negative.  Negative for abdominal pain, blood in stool, diarrhea, nausea and vomiting.  Genitourinary: Negative.  Negative for dysuria.  Musculoskeletal: Negative.  Negative for back pain.  Skin: Negative.   Neurological: Negative for dizziness, tremors, speech change, focal weakness, seizures and headaches.  Endo/Heme/Allergies: Negative.  Does not bruise/bleed easily.  Psychiatric/Behavioral: Negative.  Negative for depression, hallucinations and suicidal ideas.     PHYSICAL EXAMINATION:  GENERAL:  57 y.o.-year-old patient lying in the bed with no acute distress.  NECK:  Supple, no jugular venous distention. No thyroid enlargement, no tenderness.  LUNGS: Normal breath sounds bilaterally, no wheezing, rales,rhonchi  No use of accessory muscles of respiration.  CARDIOVASCULAR: S1, S2 normal. No murmurs, rubs, or gallops.  ABDOMEN: Soft, non-tender, non-distended. Bowel sounds present. No organomegaly or mass.  EXTREMITIES: No pedal edema, cyanosis, or clubbing.  PSYCHIATRIC: The patient is alert and oriented x 3.   SKIN: No obvious rash, lesion, or ulcer.   DATA REVIEW:   CBC Recent Labs  Lab 06/15/18 0735  WBC 7.8  HGB 10.0*  HCT 29.8*  PLT 350    Chemistries  Recent Labs  Lab 06/15/18 0735  NA 138  K 4.0  CL 104  CO2 28  GLUCOSE 98  BUN 9  CREATININE 0.88  CALCIUM 8.5*    Cardiac Enzymes Recent Labs  Lab 06/14/18 1248 06/15/18 0159 06/15/18 0735  TROPONINI <0.03 <0.03 <0.03    Microbiology Results  @[email or web address] @  RADIOLOGY:  Dg Chest 2 View  Result Date: 06/14/2018 CLINICAL DATA:  Chest pain. EXAM: CHEST - 2 VIEW COMPARISON:  Radiographs of May 13, 2018. FINDINGS: The heart size and mediastinal contours are within normal limits. Both lungs are clear. No pneumothorax or pleural effusion is noted. The visualized skeletal structures are unremarkable. IMPRESSION: No active cardiopulmonary disease. Electronically Signed   By: Marijo Conception, M.D.   On: 06/14/2018 13:08      Allergies as of 06/15/2018   No Known Allergies     Medication List    TAKE these medications  amLODipine 5 MG tablet Commonly known as:  NORVASC Take 1 tablet (5 mg total) by mouth daily.   apixaban 5 MG Tabs tablet Commonly known as:  ELIQUIS Take 5 mg by mouth 2 (two) times daily.   atorvastatin 80 MG tablet Commonly known as:  LIPITOR Take 80 mg by mouth daily.   CYMBALTA 30 MG capsule Generic drug:  DULoxetine Take 1 capsule by mouth daily.   metoprolol tartrate 50 MG tablet Commonly known as:  LOPRESSOR Take 1 tablet (50 mg total) by mouth 2 (two) times daily. What changed:    medication strength  how much to take   omeprazole 20 MG capsule Commonly known as:  PRILOSEC Take 20 mg by mouth 2 (two) times daily before a meal.   PEG 3350 Powd Take 17 g by mouth 2 (two) times daily.   traMADol 50 MG tablet Commonly known as:  ULTRAM Take 50 mg by mouth 2 (two) times daily.           Management plans discussed with the patient and she is in agreement. Stable  for discharge home  Patient should follow up with pcp  CODE STATUS:     Code Status Orders  (From admission, onward)         Start     Ordered   06/15/18 0143  Full code  Continuous     06/15/18 0142        Code Status History    Date Active Date Inactive Code Status Order ID Comments User Context   04/08/2018 0117 04/09/2018 1913 Full Code [id number]  Lance Coon, MD Inpatient   01/24/2018 2305 01/31/2018 1915 Full Code [id number]  Lance Coon, MD Inpatient   08/06/2017 2022 08/17/2017 2158 Full Code [id number]  Florene Glen, MD ED   07/29/2017 1600 08/02/2017 1814 Full Code [id number]  Gorden Harms, MD Inpatient   06/19/2017 2019 06/28/2017 1638 Full Code [id number]  Vickie Epley, MD ED      TOTAL TIME TAKING CARE OF THIS PATIENT: 38 minutes.    Note: This dictation was prepared with Dragon dictation along with smaller phrase technology. Any transcriptional errors that result from this process are unintentional.  Nikyah Lackman M.D on 06/15/2018 at 12:05 PM  Between 7am to 6pm - Pager - [phone] After 6pm go to [email or web address] - password [email or web address] [facility]  Office  [phone]  CC: Primary care physician; Irean Hong, MD

## 2018-06-15 NOTE — Progress Notes (Signed)
Patient given discharge instructions with prescription. Patient verbalized understanding with no further questions or concerns. Patient waiting for her ride to get here. IV taken out and tele monitor off. Will continue to monitor patient until then.

## 2018-06-15 NOTE — Progress Notes (Signed)
Patient discharged home.

## 2018-06-19 ENCOUNTER — Ambulatory Visit
Admission: RE | Admit: 2018-06-19 | Discharge: 2018-06-19 | Disposition: A | Discharge status: Home / Self Care | Payer: Medicare Other | Source: Ambulatory Visit | Attending: Gastroenterology | Admitting: Gastroenterology

## 2018-06-19 DIAGNOSIS — K439 Ventral hernia without obstruction or gangrene: Secondary | ICD-10-CM | POA: Diagnosis not present

## 2018-06-19 DIAGNOSIS — K559 Vascular disorder of intestine, unspecified: Secondary | ICD-10-CM | POA: Insufficient documentation

## 2018-06-19 DIAGNOSIS — R1084 Generalized abdominal pain: Secondary | ICD-10-CM | POA: Diagnosis present

## 2018-06-19 DIAGNOSIS — I700 Atherosclerosis of aorta: Secondary | ICD-10-CM | POA: Insufficient documentation

## 2018-06-19 DIAGNOSIS — K55069 Acute infarction of intestine, part and extent unspecified: Secondary | ICD-10-CM | POA: Insufficient documentation

## 2018-06-19 DIAGNOSIS — I708 Atherosclerosis of other arteries: Secondary | ICD-10-CM | POA: Insufficient documentation

## 2018-06-19 HISTORY — DX: Heart failure, unspecified: I50.9

## 2018-06-19 MED ORDER — IOPAMIDOL (ISOVUE-370) INJECTION 76%
100.0000 mL | Freq: Once | INTRAVENOUS | Status: AC | PRN
  Administered 2018-06-19: 100 mL via INTRAVENOUS

## 2018-06-21 ENCOUNTER — Encounter (INDEPENDENT_AMBULATORY_CARE_PROVIDER_SITE_OTHER): Payer: Self-pay | Admitting: Nurse Practitioner

## 2018-06-21 ENCOUNTER — Ambulatory Visit (INDEPENDENT_AMBULATORY_CARE_PROVIDER_SITE_OTHER): Payer: Medicare Other | Admitting: Nurse Practitioner

## 2018-06-21 VITALS — BP 150/91 | HR 74 | Resp 14 | Ht 62.0 in | Wt 222.0 lb

## 2018-06-21 DIAGNOSIS — K55069 Acute infarction of intestine, part and extent unspecified: Secondary | ICD-10-CM | POA: Diagnosis not present

## 2018-06-21 DIAGNOSIS — E785 Hyperlipidemia, unspecified: Secondary | ICD-10-CM

## 2018-06-21 DIAGNOSIS — M170 Bilateral primary osteoarthritis of knee: Secondary | ICD-10-CM

## 2018-06-21 DIAGNOSIS — I10 Essential (primary) hypertension: Secondary | ICD-10-CM

## 2018-06-21 MED ORDER — TRAMADOL HCL 50 MG PO TABS: 50.0000 mg | ORAL_TABLET | Freq: Two times a day (BID) | ORAL | 0 refills | Status: DC

## 2018-06-24 ENCOUNTER — Encounter (INDEPENDENT_AMBULATORY_CARE_PROVIDER_SITE_OTHER): Payer: Self-pay | Admitting: Nurse Practitioner

## 2018-06-24 NOTE — Progress Notes (Signed)
Subjective:    Patient ID: Carla [name], female    DOB: 03-09-1961, 57 y.o.   MRN: [id number] Chief Complaint  Patient presents with  . Follow-up    AAA, stomach    HPI  Carla [name] is a 57 y.o. female who presents with referral from [place], [place] regarding abnormal CT scan.  Ms. Jose has had numerous complaints of abdominal pain over the last several months, she underwent a SMA thrombectomy on 01/28/2018.  This thrombectomy was to resolve a recurrent SMA occlusion.  Following the procedure the patient continues to complain of abdominal pain.  She reports that the pain is fairly consistent but worse after eating.  She endorses some nausea, without any vomiting.  She is also concerned for a large hernia located on her right side.  Patient also has multiple complaints of pain elsewhere, such as her back as well as her knees.  The patient underwent a CT angiogram which reveals no evidence of abdominal aortic aneurysm.  The superior mesenteric artery stent is occluded however the remainder of the superior mesenteric artery is patent.  There are also mesenteric collaterals present.  There is also a large ventral abdominal  wall hernia with no evidence of obstruction.  The patient denies any fever, chills.  She denies any current chest pain, shortness of breath, although she has been to the.  She denies any amaurosis fugax, weakness or claudication-like symptoms.  Constitutional: [] Weight loss  [] Fever  [] Chills Cardiac: [] Chest pain   [] Chest pressure   [] Palpitations   [] Shortness of breath when laying flat   [] Shortness of breath with exertion. Vascular:  [] Pain in legs with walking   [] Pain in legs with standing  [] History of DVT   [] Phlebitis   [] Swelling in legs   [] Varicose veins   [] Non-healing ulcers Pulmonary:   [] Uses home oxygen   [] Productive cough   [] Hemoptysis   [] Wheeze  [] COPD   [] Asthma Neurologic:  [] Dizziness   [] Seizures   [] History of stroke   [] History of TIA  [] Aphasia    [] Vissual changes   [] Weakness or numbness in arm   [] Weakness or numbness in leg Musculoskeletal:   [] Joint swelling   [x] Joint pain   [] Low back pain Hematologic:  [] Easy bruising  [] Easy bleeding   [] Hypercoagulable state   [] Anemic Gastrointestinal:  [] Diarrhea   [] Vomiting  [] Gastroesophageal reflux/heartburn   [] Difficulty swallowing. Genitourinary:  [] Chronic kidney disease   [] Difficult urination  [] Frequent urination   [] Blood in urine Skin:  [] Rashes   [] Ulcers  Psychological:  [] History of anxiety   []  History of major depression.     Objective:   Physical Exam  BP (!) 150/91 (BP Location: Right Arm, Patient Position: Sitting)   Pulse 74   Resp 14   Ht 5\' 2"  (1.575 m)   Wt 222 lb (100.7 kg)   BMI 40.60 kg/m   Past Medical History:  Diagnosis Date  . Abdominal aortic atherosclerosis ([place])    a. 05/2017 CTA abd/pelvis: significant atherosclerotic dzs of the infrarenal abd Ao w/ some mural thrombus. No aneurysm or dissection.  . Abdominal pain   . Acute focal ischemia of small intestine (HCC)   . Acute right-sided low back pain with right-sided sciatica 06/13/2017  . AKI (acute kidney injury) ([place]) 07/29/2017  . Baker's cyst of knee, right    a. 07/2016 U/S: 4.1 x 1.4 x 2.9 cystic structure in R poplitetal fossa.  . Bell's palsy   . Cancer ([place])   .  Chest pain    a. 08/2012 Lexiscan MV: EF 54%, non ischemia/infarct.  . CHF (congestive heart failure) ([place])   . Chronic idiopathic constipation   . Chronic mesenteric ischemia ([place])   . Diastolic dysfunction    a. 05/2017 Echo: Ef 60-65%, no rwma, Gr1 DD, no source of cardiac emboli.  . 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 05/2017 CTA Abd/pelvis: apparent thrombus or embolus in prox SMA (70-90%); b. 05/2017 catheter directed tPA, mechanical thrombectomy, and stenting of the SMA.  . Essential hypertension with goal blood pressure less than 140/90 01/19/2016  . Gastroesophageal reflux disease 01/19/2016  . GERD  (gastroesophageal reflux disease)   . Hyperlipidemia   . Hypertension   . Hypotension 07/29/2017  . Morbid obesity with BMI of 40.0-44.9, adult ([place])   . Occlusive mesenteric ischemia ([place]) 06/19/2017  . Personal history of other malignant neoplasm of skin 06/01/2016  . Primary osteoarthritis of both knees 06/13/2017  . Recurrent major depressive disorder ([place]) 01/19/2016  . SBO (small bowel obstruction) ([place]) 08/06/2017  . 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 Urinary tract infection 01/09/2014     Gen: WD/WN, NAD Head: [place]/AT, No temporalis wasting.  Ear/Nose/Throat: Hearing grossly intact, nares w/o erythema or drainage Eyes: PER, EOMI, sclera nonicteric.  Neck: Supple, no masses.  No JVD.  Pulmonary:  Good air movement, no use of accessory muscles.  Cardiac: RRR Vascular:  Vessel Right Left  DP palpable palpable  Gastrointestinal: No guarding/no peritoneal signs. Large abdominal hernia. Musculoskeletal: M/S 5/5 throughout.  No deformity or atrophy.  Neurologic: Pain and light touch intact in extremities.  Symmetrical.  Speech is fluent. Motor exam as listed above. Psychiatric: Judgment intact, Mood & affect appropriate for pt's clinical situation. Dermatologic: No Venous rashes. No Ulcers Noted.  No changes consistent with cellulitis. Lymph : No Cervical lymphadenopathy, no lichenification or skin changes of chronic lymphedema.   Social History   Socioeconomic History  . Marital status: Single    Spouse name: Not on file  . Number of children: Not on file  . Years of education: Not on file  . Highest education level: Not on file  Occupational History  . Occupation: Unemployed  Social Needs  . Financial resource strain: Not on file  . Food insecurity:    Worry: Not on file    Inability: Not on file  . Transportation needs:    Medical: Not on file    Non-medical: Not on file  Tobacco Use  . Smoking status: Current Every Day Smoker    Packs/day: 0.50     Types: Cigarettes  . Smokeless tobacco: Never Used  Substance and Sexual Activity  . Alcohol use: No  . Drug use: No  . Sexual activity: Never  Lifestyle  . Physical activity:    Days per week: Not on file    Minutes per session: Not on file  . Stress: Not on file  Relationships  . Social connections:    Talks on phone: Not on file    Gets together: Not on file    Attends religious service: Not on file    Active member of club or organization: Not on file    Attends meetings of clubs or organizations: Not on file    Relationship status: Not on file  . Intimate partner violence:    Fear of current or ex partner: Not on file    Emotionally abused: Not on file    Physically abused: Not  on file    Forced sexual activity: Not on file  Other Topics Concern  . Not on file  Social History Narrative   Lives in [place] with boyfriend.  Does not routinely exercise.    Past Surgical History:  Procedure Laterality Date  . CHOLECYSTECTOMY    . LAPAROTOMY N/A 08/08/2017   Procedure: EXPLORATORY LAPAROTOMY POSSIBLE BOWEL RESECTION;  Surgeon: Jules Husbands, MD;  Location: [facility] ORS;  Service: General;  Laterality: N/A;  . TEE WITHOUT CARDIOVERSION N/A 06/22/2017   Procedure: TRANSESOPHAGEAL ECHOCARDIOGRAM (TEE);  Surgeon: Wellington Hampshire, MD;  Location: [facility] ORS;  Service: Cardiovascular;  Laterality: N/A;  . VAGINAL HYSTERECTOMY    . VISCERAL ARTERY INTERVENTION N/A 06/20/2017   Procedure: Visceral Artery Intervention, possible aortic thrombectomy;  Surgeon: Algernon Huxley, MD;  Location: [facility];  Service: Cardiovascular;  Laterality: N/A;  . VISCERAL ARTERY INTERVENTION N/A 01/28/2018   Procedure: VISCERAL ARTERY INTERVENTION;  Surgeon: Algernon Huxley, MD;  Location: [facility];  Service: Cardiovascular;  Laterality: N/A;    Family History  Problem Relation Age of Onset  . Hypertension Mother   . Heart disease Mother   . Heart attack Mother   . Breast cancer Mother    . Hypertension Father   . Breast cancer Paternal Aunt     No Known Allergies     Assessment & Plan:   1. 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  The superior mesenteric artery stent is occluded however the remainder of the superior mesenteric artery is patent.  There are also mesenteric collaterals present.  There is also a large ventral abdominal  wall hernia with no evidence of obstruction.  Recommend:  Skin currently has restenosis of her SMA stent due to thrombosis.  However due to her recurrence of thromboses, as well as multiple other medical issues she is not felt to be a candidate for bypass of her SMA.  The patient also has developed multiple collateralization to compensate for SMA thrombosis.  It is felt that trying to undergo repair of revascularization would actually do Ms. Hlad more harm than benefit.  This was discussed with the patient and her daughter this was understood by both.   2. Hypertension, unspecified type Continue antihypertensive medications as already ordered, these medications have been reviewed and there are no changes at this time.   3. Hyperlipidemia, unspecified hyperlipidemia type Continue statin as ordered and reviewed, no changes at this time   4. Primary osteoarthritis of both knees Continue NSAID medications as already ordered, these medications have been reviewed and there are no changes at this time.  Continued activity and therapy was stressed.    Current Outpatient Medications on File Prior to Visit  Medication Sig Dispense Refill  . amLODipine (NORVASC) 5 MG tablet Take 1 tablet (5 mg total) by mouth daily. 30 tablet 2  . apixaban (ELIQUIS) 5 MG TABS tablet Take 5 mg by mouth 2 (two) times daily.    [name] aspirin 81 MG chewable tablet Chew by mouth daily.    [name] atorvastatin (LIPITOR) 80 MG tablet Take 80 mg by mouth daily.    . DULoxetine  (CYMBALTA) 30 MG capsule Take 1 capsule by mouth daily.    . metoprolol tartrate (LOPRESSOR) 50 MG tablet Take 1 tablet (50 mg total) by mouth 2 (two) times daily. 30 tablet 0  . omeprazole (PRILOSEC) 20 MG capsule Take 20  mg by mouth 2 (two) times daily before a meal.    . Polyethylene Glycol 3350 (PEG 3350) POWD Take 17 g by mouth 2 (two) times daily.     No current facility-administered medications on file prior to visit.     There are no Patient Instructions on file for this visit. No follow-ups on file.   Kris Hartmann, NP

## 2018-07-02 DIAGNOSIS — K436 Other and unspecified ventral hernia with obstruction, without gangrene: Secondary | ICD-10-CM | POA: Insufficient documentation

## 2018-07-05 ENCOUNTER — Ambulatory Visit (INDEPENDENT_AMBULATORY_CARE_PROVIDER_SITE_OTHER): Payer: Self-pay | Admitting: Vascular Surgery

## 2018-07-08 ENCOUNTER — Ambulatory Visit
Admission: RE | Admit: 2018-07-08 | Discharge: 2018-07-08 | Disposition: A | Discharge status: Home / Self Care | Payer: Medicare Other | Source: Ambulatory Visit | Attending: Surgery | Admitting: Surgery

## 2018-07-08 ENCOUNTER — Encounter
Admission: RE | Admit: 2018-07-08 | Discharge: 2018-07-08 | Disposition: A | Discharge status: Home / Self Care | Payer: Medicare Other | Source: Ambulatory Visit | Attending: Surgery | Admitting: Surgery

## 2018-07-08 ENCOUNTER — Other Ambulatory Visit: Payer: Self-pay

## 2018-07-08 DIAGNOSIS — Z419 Encounter for procedure for purposes other than remedying health state, unspecified: Secondary | ICD-10-CM

## 2018-07-08 DIAGNOSIS — Z01818 Encounter for other preprocedural examination: Secondary | ICD-10-CM

## 2018-07-08 DIAGNOSIS — R9431 Abnormal electrocardiogram [ECG] [EKG]: Secondary | ICD-10-CM | POA: Insufficient documentation

## 2018-07-08 DIAGNOSIS — M1711 Unilateral primary osteoarthritis, right knee: Secondary | ICD-10-CM

## 2018-07-08 HISTORY — DX: Prediabetes: R73.03

## 2018-07-08 HISTORY — DX: Personal history of urinary calculi: Z87.442

## 2018-07-08 LAB — TYPE AND SCREEN
ABO/RH(D): A POS
Antibody Screen: NEGATIVE

## 2018-07-08 LAB — URINALYSIS, ROUTINE W REFLEX MICROSCOPIC
Bilirubin Urine: NEGATIVE
Glucose, UA: NEGATIVE mg/dL
Hgb urine dipstick: NEGATIVE
Ketones, ur: NEGATIVE mg/dL
Leukocytes, UA: NEGATIVE
Nitrite: NEGATIVE
Protein, ur: NEGATIVE mg/dL
Specific Gravity, Urine: 1.004 — ABNORMAL LOW (ref 1.005–1.030)
pH: 6 (ref 5.0–8.0)

## 2018-07-08 LAB — BASIC METABOLIC PANEL
Anion gap: 9 (ref 5–15)
BUN: 10 mg/dL (ref 6–20)
CO2: 25 mmol/L (ref 22–32)
Calcium: 8.8 mg/dL — ABNORMAL LOW (ref 8.9–10.3)
Chloride: 101 mmol/L (ref 98–111)
Creatinine, Ser: 0.93 mg/dL (ref 0.44–1.00)
GFR calc Af Amer: >60 mL/min (ref >60)
GFR calc non Af Amer: >60 mL/min (ref >60)
Glucose, Bld: 90 mg/dL (ref 70–99)
Potassium: 3.9 mmol/L (ref 3.5–5.1)
Sodium: 135 mmol/L (ref 135–145)

## 2018-07-08 LAB — CBC
HCT: 33.5 % — ABNORMAL LOW (ref 35.0–47.0)
Hemoglobin: 10.9 g/dL — ABNORMAL LOW (ref 12.0–16.0)
MCH: 25.9 pg — ABNORMAL LOW (ref 26.0–34.0)
MCHC: 32.7 g/dL (ref 32.0–36.0)
MCV: 79.2 fL — ABNORMAL LOW (ref 80.0–100.0)
Platelets: 363 10*3/uL (ref 150–440)
RBC: 4.23 MIL/uL (ref 3.80–5.20)
RDW: 19.1 % — ABNORMAL HIGH (ref 11.5–14.5)
WBC: 8.4 10*3/uL (ref 3.6–11.0)

## 2018-07-08 LAB — SURGICAL PCR SCREEN
MRSA, PCR: NEGATIVE
Staphylococcus aureus: NEGATIVE

## 2018-07-08 LAB — PROTIME-INR
INR: 1.04
Prothrombin Time: 13.5 seconds (ref 11.4–15.2)

## 2018-07-08 NOTE — Patient Instructions (Signed)
Your procedure is scheduled on: Thurs 9/19 Report to Day Surgery. To find out your arrival time please call [phone] between [place] on Wed. 9/18.  Remember: Instructions that are not followed completely may result in serious medical risk,  up to and including death, or upon the discretion of your surgeon and anesthesiologist your  surgery may need to be rescheduled.     _X__ 1. Do not eat food after midnight the night before your procedure.                 No gum chewing or hard candies. You may drink clear liquids up to 2 hours                 before you are scheduled to arrive for your surgery- DO not drink clear                 liquids within 2 hours of the start of your surgery.                 Clear Liquids include:  water, apple juice without pulp, clear carbohydrate                 drink such as Clearfast of Gatorade, Black Coffee or Tea (Do not add                 anything to coffee or tea).  __X__2.  On the morning of surgery brush your teeth with toothpaste and water, you                may rinse your mouth with mouthwash if you wish.  Do not swallow any toothpaste of mouthwash.     ___ 3.  No Alcohol for 24 hours before or after surgery.   _X__ 4.  Do Not Smoke or use e-cigarettes For 24 Hours Prior to Your Surgery.                 Do not use any chewable tobacco products for at least 6 hours prior to                 surgery.  ____  5.  Bring all medications with you on the day of surgery if instructed.   __x__  6.  Notify your doctor if there is any change in your medical condition      (cold, fever, infections).     Do not wear jewelry, make-up, hairpins, clips or nail polish. Do not wear lotions, powders, or perfumes. You may wear deodorant. Do not shave 48 hours prior to surgery. Men may shave face and neck. Do not bring valuables to the hospital.    [facility] is not responsible for any belongings or valuables.  Contacts, dentures  or bridgework may not be worn into surgery. Leave your suitcase in the car. After surgery it may be brought to your room. For patients admitted to the hospital, discharge time is determined by your treatment team.   Patients discharged the day of surgery will not be allowed to drive home.   Please read over the following fact sheets that you were given:     __x__ Take these medicines the morning of surgery with A SIP OF WATER:    1. amLODipine (NORVASC) 5 MG tablet  2. DULoxetine (CYMBALTA) 30 MG capsule  3. metoprolol tartrate (LOPRESSOR) 50 MG tablet  4.omeprazole (PRILOSEC) 20 MG capsule  5.traMADol (ULTRAM) 50 MG tablet  6.  ____ Fleet Enema (as directed)   __x__ Use CHG Soap as directed  ____ Use inhalers on the day of surgery  ____ Stop metformin 2 days prior to surgery    ____ Take 1/2 of usual insulin dose the night before surgery. No insulin the morning          of surgery.   ____ Stop apixaban (ELIQUIS) 5 MG TABS tablet today  ____ Stop Anti-inflammatories on    ____ Stop supplements until after surgery.    ____ Bring C-Pap to the hospital.

## 2018-07-09 NOTE — Pre-Procedure Instructions (Signed)
CLEARED MILD RISK BY DR CALLWOOD 07/03/18 ON CHART

## 2018-07-10 MED ORDER — CEFAZOLIN SODIUM-DEXTROSE 2-4 GM/100ML-% IV SOLN
2.0000 g | Freq: Once | INTRAVENOUS | Status: AC
  Administered 2018-07-11: 2 g via INTRAVENOUS

## 2018-07-11 ENCOUNTER — Inpatient Hospital Stay
Admission: RE | Admit: 2018-07-11 | Discharge: 2018-07-15 | DRG: 470 | Disposition: A | Discharge status: Home Health | Payer: Medicare Other | Source: Ambulatory Visit | Attending: Surgery | Admitting: Surgery

## 2018-07-11 ENCOUNTER — Inpatient Hospital Stay: Payer: Medicare Other | Admitting: Certified Registered"

## 2018-07-11 ENCOUNTER — Other Ambulatory Visit: Payer: Self-pay

## 2018-07-11 ENCOUNTER — Encounter
Admission: RE | Disposition: A | Discharge status: Home Health | Payer: Self-pay | Source: Ambulatory Visit | Attending: Surgery

## 2018-07-11 ENCOUNTER — Encounter: Payer: Self-pay | Admitting: *Deleted

## 2018-07-11 ENCOUNTER — Inpatient Hospital Stay: Payer: Medicare Other

## 2018-07-11 DIAGNOSIS — Z7901 Long term (current) use of anticoagulants: Secondary | ICD-10-CM

## 2018-07-11 DIAGNOSIS — Z85828 Personal history of other malignant neoplasm of skin: Secondary | ICD-10-CM | POA: Diagnosis not present

## 2018-07-11 DIAGNOSIS — Z7982 Long term (current) use of aspirin: Secondary | ICD-10-CM

## 2018-07-11 DIAGNOSIS — Z86718 Personal history of other venous thrombosis and embolism: Secondary | ICD-10-CM | POA: Diagnosis not present

## 2018-07-11 DIAGNOSIS — Z9049 Acquired absence of other specified parts of digestive tract: Secondary | ICD-10-CM

## 2018-07-11 DIAGNOSIS — I482 Chronic atrial fibrillation: Secondary | ICD-10-CM | POA: Diagnosis present

## 2018-07-11 DIAGNOSIS — E785 Hyperlipidemia, unspecified: Secondary | ICD-10-CM | POA: Diagnosis present

## 2018-07-11 DIAGNOSIS — F329 Major depressive disorder, single episode, unspecified: Secondary | ICD-10-CM | POA: Diagnosis present

## 2018-07-11 DIAGNOSIS — Z8249 Family history of ischemic heart disease and other diseases of the circulatory system: Secondary | ICD-10-CM | POA: Diagnosis not present

## 2018-07-11 DIAGNOSIS — I700 Atherosclerosis of aorta: Secondary | ICD-10-CM | POA: Diagnosis present

## 2018-07-11 DIAGNOSIS — Z23 Encounter for immunization: Secondary | ICD-10-CM

## 2018-07-11 DIAGNOSIS — Z87442 Personal history of urinary calculi: Secondary | ICD-10-CM

## 2018-07-11 DIAGNOSIS — I110 Hypertensive heart disease with heart failure: Secondary | ICD-10-CM | POA: Diagnosis present

## 2018-07-11 DIAGNOSIS — I509 Heart failure, unspecified: Secondary | ICD-10-CM | POA: Diagnosis present

## 2018-07-11 DIAGNOSIS — G8929 Other chronic pain: Secondary | ICD-10-CM | POA: Diagnosis present

## 2018-07-11 DIAGNOSIS — Z833 Family history of diabetes mellitus: Secondary | ICD-10-CM

## 2018-07-11 DIAGNOSIS — E559 Vitamin D deficiency, unspecified: Secondary | ICD-10-CM | POA: Diagnosis present

## 2018-07-11 DIAGNOSIS — Z96651 Presence of right artificial knee joint: Secondary | ICD-10-CM

## 2018-07-11 DIAGNOSIS — Z90710 Acquired absence of both cervix and uterus: Secondary | ICD-10-CM | POA: Diagnosis not present

## 2018-07-11 DIAGNOSIS — F419 Anxiety disorder, unspecified: Secondary | ICD-10-CM | POA: Diagnosis present

## 2018-07-11 DIAGNOSIS — Z6841 Body Mass Index (BMI) 40.0 and over, adult: Secondary | ICD-10-CM | POA: Diagnosis not present

## 2018-07-11 DIAGNOSIS — M170 Bilateral primary osteoarthritis of knee: Secondary | ICD-10-CM | POA: Diagnosis present

## 2018-07-11 DIAGNOSIS — K219 Gastro-esophageal reflux disease without esophagitis: Secondary | ICD-10-CM | POA: Diagnosis present

## 2018-07-11 DIAGNOSIS — I82409 Acute embolism and thrombosis of unspecified deep veins of unspecified lower extremity: Secondary | ICD-10-CM

## 2018-07-11 DIAGNOSIS — M25561 Pain in right knee: Secondary | ICD-10-CM | POA: Diagnosis present

## 2018-07-11 DIAGNOSIS — Z8349 Family history of other endocrine, nutritional and metabolic diseases: Secondary | ICD-10-CM | POA: Diagnosis not present

## 2018-07-11 HISTORY — PX: TOTAL KNEE ARTHROPLASTY: SHX125

## 2018-07-11 SURGERY — ARTHROPLASTY, KNEE, TOTAL
Anesthesia: Spinal | Site: Knee | Laterality: Right

## 2018-07-11 MED ORDER — BUPIVACAINE-EPINEPHRINE (PF) 0.5% -1:200000 IJ SOLN
INTRAMUSCULAR | Status: DC | PRN
  Administered 2018-07-11: 30 mL via PERINEURAL

## 2018-07-11 MED ORDER — TRANEXAMIC ACID 1000 MG/10ML IV SOLN
INTRAVENOUS | Status: DC | PRN
  Administered 2018-07-11: 1000 mg via INTRAVENOUS

## 2018-07-11 MED ORDER — PROMETHAZINE HCL 25 MG/ML IJ SOLN: 6.2500 mg | INTRAMUSCULAR | Status: DC | PRN

## 2018-07-11 MED ORDER — FLEET ENEMA 7-19 GM/118ML RE ENEM: 1.0000 | ENEMA | Freq: Once | RECTAL | Status: DC | PRN

## 2018-07-11 MED ORDER — ENSURE PO LIQD
237.0000 mL | Freq: Two times a day (BID) | ORAL | Status: DC
  Administered 2018-07-11 – 2018-07-15 (×9): 237 mL via ORAL

## 2018-07-11 MED ORDER — FERROUS SULFATE 325 (65 FE) MG PO TABS
325.0000 mg | ORAL_TABLET | Freq: Every day | ORAL | Status: DC
  Administered 2018-07-11 – 2018-07-15 (×5): 325 mg via ORAL
  Filled 2018-07-11 (×5): qty 1

## 2018-07-11 MED ORDER — PANTOPRAZOLE SODIUM 40 MG PO TBEC
40.0000 mg | DELAYED_RELEASE_TABLET | Freq: Every day | ORAL | Status: DC
  Administered 2018-07-12 – 2018-07-15 (×4): 40 mg via ORAL
  Filled 2018-07-11 (×4): qty 1

## 2018-07-11 MED ORDER — LORAZEPAM 2 MG/ML IJ SOLN
1.0000 mg | Freq: Once | INTRAMUSCULAR | Status: AC
  Administered 2018-07-11: 1 mg via INTRAVENOUS

## 2018-07-11 MED ORDER — PROPOFOL 10 MG/ML IV BOLUS
INTRAVENOUS | Status: AC
  Filled 2018-07-11: qty 20

## 2018-07-11 MED ORDER — FENTANYL CITRATE (PF) 100 MCG/2ML IJ SOLN
INTRAMUSCULAR | Status: AC
  Filled 2018-07-11: qty 2

## 2018-07-11 MED ORDER — METOPROLOL TARTRATE 50 MG PO TABS
50.0000 mg | ORAL_TABLET | Freq: Two times a day (BID) | ORAL | Status: DC
  Administered 2018-07-11 – 2018-07-15 (×8): 50 mg via ORAL
  Filled 2018-07-11 (×9): qty 1

## 2018-07-11 MED ORDER — NALOXONE HCL 0.4 MG/ML IJ SOLN: 0.4000 mg | INTRAMUSCULAR | Status: DC | PRN

## 2018-07-11 MED ORDER — SODIUM CHLORIDE 0.9 % IJ SOLN
INTRAMUSCULAR | Status: AC
  Filled 2018-07-11: qty 50

## 2018-07-11 MED ORDER — DIPHENHYDRAMINE HCL 12.5 MG/5ML PO ELIX: 12.5000 mg | ORAL_SOLUTION | ORAL | Status: DC | PRN

## 2018-07-11 MED ORDER — ACETAMINOPHEN 325 MG PO TABS: 325.0000 mg | ORAL_TABLET | ORAL | Status: DC | PRN

## 2018-07-11 MED ORDER — LORAZEPAM 2 MG/ML IJ SOLN
INTRAMUSCULAR | Status: AC
  Filled 2018-07-11: qty 1

## 2018-07-11 MED ORDER — PROPOFOL 500 MG/50ML IV EMUL
INTRAVENOUS | Status: DC | PRN
  Administered 2018-07-11: 50 ug/kg/min via INTRAVENOUS

## 2018-07-11 MED ORDER — CEFAZOLIN SODIUM-DEXTROSE 2-4 GM/100ML-% IV SOLN
INTRAVENOUS | Status: AC
  Filled 2018-07-11: qty 100

## 2018-07-11 MED ORDER — FENTANYL CITRATE (PF) 100 MCG/2ML IJ SOLN
INTRAMUSCULAR | Status: AC
  Administered 2018-07-11: 50 ug via INTRAVENOUS
  Filled 2018-07-11: qty 2

## 2018-07-11 MED ORDER — MIDAZOLAM HCL 5 MG/5ML IJ SOLN
INTRAMUSCULAR | Status: DC | PRN
  Administered 2018-07-11 (×2): 2 mg via INTRAVENOUS

## 2018-07-11 MED ORDER — NEOMYCIN-POLYMYXIN B GU 40-200000 IR SOLN
Status: DC | PRN
  Administered 2018-07-11: 12 mL
  Administered 2018-07-11: 4 mL

## 2018-07-11 MED ORDER — ATORVASTATIN CALCIUM 80 MG PO TABS
80.0000 mg | ORAL_TABLET | Freq: Every day | ORAL | Status: DC
  Administered 2018-07-11 – 2018-07-14 (×4): 80 mg via ORAL
  Filled 2018-07-11 (×3): qty 4
  Filled 2018-07-11 (×4): qty 1
  Filled 2018-07-11: qty 4
  Filled 2018-07-11: qty 1

## 2018-07-11 MED ORDER — ACETAMINOPHEN 160 MG/5ML PO SOLN
325.0000 mg | ORAL | Status: DC | PRN
  Filled 2018-07-11: qty 20.3

## 2018-07-11 MED ORDER — LACTATED RINGERS IV SOLN
INTRAVENOUS | Status: DC
  Administered 2018-07-11: 07:00:00 via INTRAVENOUS

## 2018-07-11 MED ORDER — BUPIVACAINE HCL (PF) 0.5 % IJ SOLN
INTRAMUSCULAR | Status: DC | PRN
  Administered 2018-07-11: 1.5 mL

## 2018-07-11 MED ORDER — ACETAMINOPHEN 500 MG PO TABS
1000.0000 mg | ORAL_TABLET | Freq: Four times a day (QID) | ORAL | Status: AC
  Administered 2018-07-11 – 2018-07-12 (×3): 1000 mg via ORAL
  Filled 2018-07-11 (×3): qty 2

## 2018-07-11 MED ORDER — ACETAMINOPHEN 325 MG PO TABS
325.0000 mg | ORAL_TABLET | Freq: Four times a day (QID) | ORAL | Status: DC | PRN
  Administered 2018-07-14: 650 mg via ORAL
  Filled 2018-07-11 (×2): qty 2

## 2018-07-11 MED ORDER — BUPIVACAINE LIPOSOME 1.3 % IJ SUSP
INTRAMUSCULAR | Status: AC
  Filled 2018-07-11: qty 20

## 2018-07-11 MED ORDER — MAGNESIUM HYDROXIDE 400 MG/5ML PO SUSP
30.0000 mL | Freq: Every day | ORAL | Status: DC | PRN
  Administered 2018-07-12 – 2018-07-15 (×3): 30 mL via ORAL
  Filled 2018-07-11 (×4): qty 30

## 2018-07-11 MED ORDER — AMLODIPINE BESYLATE 5 MG PO TABS
5.0000 mg | ORAL_TABLET | Freq: Every day | ORAL | Status: DC
  Administered 2018-07-12 – 2018-07-15 (×4): 5 mg via ORAL
  Filled 2018-07-11 (×5): qty 1

## 2018-07-11 MED ORDER — TRAMADOL HCL 50 MG PO TABS
50.0000 mg | ORAL_TABLET | Freq: Four times a day (QID) | ORAL | Status: DC | PRN
  Administered 2018-07-11 – 2018-07-12 (×3): 50 mg via ORAL
  Filled 2018-07-11 (×3): qty 1

## 2018-07-11 MED ORDER — MIDAZOLAM HCL 2 MG/2ML IJ SOLN
INTRAMUSCULAR | Status: AC
  Filled 2018-07-11: qty 2

## 2018-07-11 MED ORDER — BUPIVACAINE-EPINEPHRINE (PF) 0.5% -1:200000 IJ SOLN
INTRAMUSCULAR | Status: AC
  Filled 2018-07-11: qty 30

## 2018-07-11 MED ORDER — KETOROLAC TROMETHAMINE 30 MG/ML IJ SOLN: 30.0000 mg | Freq: Once | INTRAMUSCULAR | Status: DC

## 2018-07-11 MED ORDER — METOCLOPRAMIDE HCL 5 MG/ML IJ SOLN: 5.0000 mg | Freq: Three times a day (TID) | INTRAMUSCULAR | Status: DC | PRN

## 2018-07-11 MED ORDER — DEXTROSE 5 % IV SOLN
3.0000 g | Freq: Four times a day (QID) | INTRAVENOUS | Status: AC
  Administered 2018-07-11 – 2018-07-12 (×3): 3 g via INTRAVENOUS
  Filled 2018-07-11: qty 3000
  Filled 2018-07-11 (×2): qty 3

## 2018-07-11 MED ORDER — HYDROMORPHONE HCL 1 MG/ML IJ SOLN
0.5000 mg | INTRAMUSCULAR | Status: DC | PRN
  Administered 2018-07-11 – 2018-07-12 (×2): 1 mg via INTRAVENOUS
  Filled 2018-07-11 (×2): qty 1

## 2018-07-11 MED ORDER — DOCUSATE SODIUM 100 MG PO CAPS
100.0000 mg | ORAL_CAPSULE | Freq: Two times a day (BID) | ORAL | Status: DC
  Administered 2018-07-11 – 2018-07-15 (×7): 100 mg via ORAL
  Filled 2018-07-11 (×7): qty 1

## 2018-07-11 MED ORDER — SODIUM CHLORIDE 0.9 % IV SOLN
INTRAVENOUS | Status: DC | PRN
  Administered 2018-07-11: 60 mL

## 2018-07-11 MED ORDER — HYDROCODONE-ACETAMINOPHEN 7.5-325 MG PO TABS
1.0000 | ORAL_TABLET | Freq: Once | ORAL | Status: DC | PRN
  Filled 2018-07-11: qty 1

## 2018-07-11 MED ORDER — APIXABAN 5 MG PO TABS
5.0000 mg | ORAL_TABLET | Freq: Two times a day (BID) | ORAL | Status: DC
  Administered 2018-07-12 – 2018-07-15 (×7): 5 mg via ORAL
  Filled 2018-07-11 (×7): qty 1

## 2018-07-11 MED ORDER — NEOMYCIN-POLYMYXIN B GU 40-200000 IR SOLN
Status: AC
  Filled 2018-07-11: qty 20

## 2018-07-11 MED ORDER — DULOXETINE HCL 30 MG PO CPEP
30.0000 mg | ORAL_CAPSULE | Freq: Every day | ORAL | Status: DC
  Administered 2018-07-12 – 2018-07-15 (×4): 30 mg via ORAL
  Filled 2018-07-11 (×4): qty 1

## 2018-07-11 MED ORDER — ONDANSETRON HCL 4 MG/2ML IJ SOLN: 4.0000 mg | Freq: Four times a day (QID) | INTRAMUSCULAR | Status: DC | PRN

## 2018-07-11 MED ORDER — FENTANYL CITRATE (PF) 100 MCG/2ML IJ SOLN
25.0000 ug | INTRAMUSCULAR | Status: DC | PRN
  Administered 2018-07-11 (×3): 50 ug via INTRAVENOUS

## 2018-07-11 MED ORDER — OXYCODONE HCL 5 MG PO TABS
5.0000 mg | ORAL_TABLET | ORAL | Status: DC | PRN
  Administered 2018-07-11 – 2018-07-12 (×4): 10 mg via ORAL
  Administered 2018-07-12: 5 mg via ORAL
  Administered 2018-07-12 – 2018-07-15 (×13): 10 mg via ORAL
  Filled 2018-07-11 (×2): qty 2
  Filled 2018-07-11: qty 1
  Filled 2018-07-11 (×15): qty 2

## 2018-07-11 MED ORDER — METOCLOPRAMIDE HCL 10 MG PO TABS: 5.0000 mg | ORAL_TABLET | Freq: Three times a day (TID) | ORAL | Status: DC | PRN

## 2018-07-11 MED ORDER — MEPERIDINE HCL 50 MG/ML IJ SOLN: 6.2500 mg | INTRAMUSCULAR | Status: DC | PRN

## 2018-07-11 MED ORDER — KETOROLAC TROMETHAMINE 15 MG/ML IJ SOLN
15.0000 mg | Freq: Four times a day (QID) | INTRAMUSCULAR | Status: AC
  Administered 2018-07-11 – 2018-07-12 (×3): 15 mg via INTRAVENOUS
  Filled 2018-07-11 (×3): qty 1

## 2018-07-11 MED ORDER — POLYETHYLENE GLYCOL 3350 17 G PO PACK
17.0000 g | PACK | Freq: Two times a day (BID) | ORAL | Status: DC | PRN
  Administered 2018-07-12 – 2018-07-15 (×2): 17 g via ORAL
  Filled 2018-07-11 (×2): qty 1

## 2018-07-11 MED ORDER — SODIUM CHLORIDE 0.9 % IV SOLN
INTRAVENOUS | Status: DC | PRN
  Administered 2018-07-11: 25 ug/min via INTRAVENOUS

## 2018-07-11 MED ORDER — ONDANSETRON HCL 4 MG PO TABS
4.0000 mg | ORAL_TABLET | Freq: Four times a day (QID) | ORAL | Status: DC | PRN
  Administered 2018-07-11: 4 mg via ORAL
  Filled 2018-07-11: qty 1

## 2018-07-11 MED ORDER — BISACODYL 10 MG RE SUPP
10.0000 mg | Freq: Every day | RECTAL | Status: DC | PRN
  Filled 2018-07-11: qty 1

## 2018-07-11 MED ORDER — TRANEXAMIC ACID 1000 MG/10ML IV SOLN
INTRAVENOUS | Status: AC
  Filled 2018-07-11: qty 10

## 2018-07-11 MED ORDER — FENTANYL CITRATE (PF) 100 MCG/2ML IJ SOLN
INTRAMUSCULAR | Status: DC | PRN
  Administered 2018-07-11 (×4): 50 ug via INTRAVENOUS

## 2018-07-11 SURGICAL SUPPLY — 58 items
BANDAGE ELASTIC 6 LF NS (GAUZE/BANDAGES/DRESSINGS) ×2 IMPLANT
BEARING TIBIAL KNEE AS 14M 71M (Knees) ×2 IMPLANT
BLADE SAW SAG 25X90X1.19 (BLADE) ×2 IMPLANT
BLADE SURG SZ20 CARB STEEL (BLADE) ×2 IMPLANT
CANISTER SUCT 1200ML W/VALVE (MISCELLANEOUS) ×2 IMPLANT
CANISTER SUCT 3000ML PPV (MISCELLANEOUS) ×2 IMPLANT
CEMENT BONE R 1X40 (Cement) ×4 IMPLANT
CEMENT VACUUM MIXING SYSTEM (MISCELLANEOUS) ×2 IMPLANT
CHLORAPREP W/TINT 26ML (MISCELLANEOUS) ×2 IMPLANT
COOLER POLAR GLACIER W/PUMP (MISCELLANEOUS) ×2 IMPLANT
COVER MAYO STAND STRL (DRAPES) ×2 IMPLANT
CUFF TOURN 24 STER (MISCELLANEOUS) IMPLANT
CUFF TOURN 30 STER DUAL PORT (MISCELLANEOUS) ×2 IMPLANT
DRAPE IMP U-DRAPE 54X76 (DRAPES) ×2 IMPLANT
DRAPE INCISE IOBAN 66X45 STRL (DRAPES) ×2 IMPLANT
DRAPE SHEET LG 3/4 BI-LAMINATE (DRAPES) ×2 IMPLANT
DRSG OPSITE POSTOP 4X10 (GAUZE/BANDAGES/DRESSINGS) ×2 IMPLANT
DRSG OPSITE POSTOP 4X8 (GAUZE/BANDAGES/DRESSINGS) ×2 IMPLANT
ELECT CAUTERY BLADE 6.4 (BLADE) ×2 IMPLANT
ELECT REM PT RETURN 9FT ADLT (ELECTROSURGICAL) ×2
ELECTRODE REM PT RTRN 9FT ADLT (ELECTROSURGICAL) ×1 IMPLANT
GLOVE BIO SURGEON STRL SZ7.5 (GLOVE) ×8 IMPLANT
GLOVE BIO SURGEON STRL SZ8 (GLOVE) ×8 IMPLANT
GLOVE BIOGEL PI IND STRL 8 (GLOVE) ×1 IMPLANT
GLOVE BIOGEL PI INDICATOR 8 (GLOVE) ×1
GLOVE INDICATOR 8.0 STRL GRN (GLOVE) ×2 IMPLANT
GOWN STRL REUS W/ TWL LRG LVL3 (GOWN DISPOSABLE) ×1 IMPLANT
GOWN STRL REUS W/ TWL XL LVL3 (GOWN DISPOSABLE) ×1 IMPLANT
GOWN STRL REUS W/TWL LRG LVL3 (GOWN DISPOSABLE) ×1
GOWN STRL REUS W/TWL XL LVL3 (GOWN DISPOSABLE) ×1
HOLDER FOLEY CATH W/STRAP (MISCELLANEOUS) ×2 IMPLANT
HOOD PEEL AWAY FLYTE STAYCOOL (MISCELLANEOUS) ×6 IMPLANT
IMMBOLIZER KNEE 19 BLUE UNIV (SOFTGOODS) ×2 IMPLANT
KIT TURNOVER KIT A (KITS) ×2 IMPLANT
KNEE CR FEMORAL RT 65MM (Femur) ×2 IMPLANT
NDL SAFETY ECLIPSE 18X1.5 (NEEDLE) ×2 IMPLANT
NEEDLE HYPO 18GX1.5 SHARP (NEEDLE) ×2
NEEDLE SPNL 20GX3.5 QUINCKE YW (NEEDLE) ×2 IMPLANT
NS IRRIG 1000ML POUR BTL (IV SOLUTION) ×2 IMPLANT
PACK TOTAL KNEE (MISCELLANEOUS) ×2 IMPLANT
PAD WRAPON POLAR KNEE (MISCELLANEOUS) ×1 IMPLANT
PATELLA STD 34X8.5 (Orthopedic Implant) ×2 IMPLANT
PLATE KNEE TIBIAL 71MM FIXED (Plate) ×2 IMPLANT
PULSAVAC PLUS IRRIG FAN TIP (DISPOSABLE) ×2
SOL .9 NS 3000ML IRR  AL (IV SOLUTION) ×1
SOL .9 NS 3000ML IRR UROMATIC (IV SOLUTION) ×1 IMPLANT
STAPLER SKIN PROX 35W (STAPLE) ×2 IMPLANT
SUCTION FRAZIER HANDLE 10FR (MISCELLANEOUS) ×1
SUCTION TUBE FRAZIER 10FR DISP (MISCELLANEOUS) ×1 IMPLANT
SUT VIC AB 0 CT1 36 (SUTURE) ×6 IMPLANT
SUT VIC AB 2-0 CT1 27 (SUTURE) ×3
SUT VIC AB 2-0 CT1 TAPERPNT 27 (SUTURE) ×3 IMPLANT
SYR 10ML LL (SYRINGE) ×2 IMPLANT
SYR 20CC LL (SYRINGE) ×2 IMPLANT
SYR 30ML LL (SYRINGE) ×6 IMPLANT
TIP FAN IRRIG PULSAVAC PLUS (DISPOSABLE) ×1 IMPLANT
TRAY FOLEY MTR SLVR 16FR STAT (SET/KITS/TRAYS/PACK) ×2 IMPLANT
WRAPON POLAR PAD KNEE (MISCELLANEOUS) ×2

## 2018-07-11 NOTE — Op Note (Signed)
07/11/2018  10:32 AM  Patient:   Carla [name]  Pre-Op Diagnosis:   Degenerative joint disease, right knee.  Post-Op Diagnosis:   Same  Procedure:   Right TKA using all-cemented Biomet Vanguard system with a 65 mm PCR femur, a 71 mm tibial tray with a 14 mm AS E-poly insert, and a 34 x 8.5 mm all-poly 3-pegged domed patella.  Surgeon:   Pascal Lux, MD  Assistant:   Cameron Proud, PA-C   Anesthesia:   Spinal  Findings:   As above  Complications:   None  EBL:   10 cc  Fluids:   800 cc crystalloid  UOP:   200 cc  TT:   97 minutes at 300 mmHg  Drains:   None  Closure:   Staples  Implants:   As above  Brief Clinical Note:   The patient is a 57 year old female with a long history of progressively worsening right knee pain. The patient's symptoms have progressed despite medications, activity modification, injections, etc. The patient's history and examination were consistent with advanced degenerative joint disease of the right knee confirmed by plain radiographs. The patient presents at this time for a right total knee arthroplasty.  Procedure:   The patient was brought into the operating room. After adequate spinal anesthesia was obtained, the patient was lain in the supine position. A Foley catheter was placed by the nurse before the right lower extremity was prepped with ChloraPrep solution and draped sterilely. Preoperative antibiotics were administered. After verifying the proper laterality with a surgical timeout, the limb was exsanguinated with an Esmarch and the tourniquet inflated to 300 mmHg. A standard anterior approach to the knee was made through an approximately 7 inch incision. The incision was carried down through the subcutaneous tissues to expose superficial retinaculum. This was split the length of the incision and the medial flap elevated sufficiently to expose the medial retinaculum. The medial retinaculum was incised, leaving a 3-4 mm cuff of tissue on the  patella. This was extended distally along the medial border of the patellar tendon and proximally through the medial third of the quadriceps tendon. A subtotal fat pad excision was performed before the soft tissues were elevated off the anteromedial and anterolateral aspects of the proximal tibia to the level of the collateral ligaments. The anterior portions of the medial and lateral menisci were removed, as was the anterior cruciate ligament. With the knee flexed to 90, the external tibial guide was positioned and the appropriate proximal tibial cut made. This piece was taken to the back table where it was measured and found to be optimally replicated by a 71 mm component.  Attention was directed to the distal femur. The intramedullary canal was accessed through a 3/8" drill hole. The intramedullary guide was inserted and position in order to obtain a neutral flexion gap. The intercondylar block was positioned with care taken to avoid notching the anterior cortex of the femur. The appropriate cut was made. Next, the distal cutting block was placed at 6 of valgus alignment. Using the 9 mm slot, the distal cut was made. The distal femur was measured and found to be optimally replicated by the 65 mm component. The 65 mm 4-in-1 cutting block was positioned and first the posterior, then the posterior chamfer, the anterior chamfer, femoral and intercondylar cuts were made. At this point, the posterior portions medial and lateral menisci were removed. A trial reduction was performed using the appropriate femoral and tibial components with first the  10 mm, then the 12 mm, and finally the 14 mm insert. The 13 mm insert demonstrated excellent stability to varus and valgus stressing both in flexion and extension while permitting full extension. Patella tracking was assessed and found to be excellent. Therefore, the tibial guide position was marked on the proximal tibia. The patella thickness was measured and found to be  17 mm. Therefore, the appropriate cut was made. The patellar surface was measured and found to be optimally replicated by the 34 mm component. The three peg holes were drilled in place before the trial button was inserted. Patella tracking was assessed and found to be excellent, passing the "no thumb test". The lug holes were drilled into the distal femur before the trial component was removed, leaving only the tibial tray. The keel was then created using the appropriate tower, reamer, and punch.  The bony surfaces were prepared for cementing by irrigating thoroughly with bacitracin saline solution. A bone plug was fashioned from some of the bone that had been removed previously and used to plug the distal femoral canal. In addition, 20 cc of Exparel diluted out to 60 cc with normal saline and 30 cc of 0.5% Sensorcaine were injected into the postero-medial and postero-lateral aspects of the knee, the medial and lateral gutter regions, and the peri-incisional tissues to help with postoperative analgesia. Meanwhile, the cement was being mixed on the back table. When it was ready, the tibial tray was cemented in first. The excess cement was removed using [employer] fast streamer. Next, the femoral component was impacted into place. Again, the excess cement was removed using [employer] fast streamer. The 14 mm trial insert was positioned and the knee brought into extension while the cement hardened. Finally, the patella was cemented into place and secured using the patellar clamp. Again, the excess cement was removed using [employer] fast streamer. Once the cement had hardened, the knee was placed through a range of motion with the findings as described above.  There was some mild anterior posterior laxity in mid section. Therefore, it was elected to proceed with an anterior stabilized insert. The trial insert was removed and, after verifying that no cement had been retained posteriorly, the permanent 14 mm anterior stabilized E-polyethylene  insert was positioned and secured using the appropriate key locking mechanism. Again the knee was placed through a range of motion with the findings as described above.  The wound was copiously irrigated with bacitracin saline solution using the jet lavage system before the quadriceps tendon and retinacular layer were reapproximated using #0 Vicryl interrupted sutures. The superficial retinacular layer also was closed using a running #0 Vicryl suture. A total of 10 cc of transexemic acid (TXA) was injected intra-articularly before the subcutaneous tissues were closed in several layers using 2-0 Vicryl interrupted sutures. The skin was closed using staples. A sterile honeycomb dressing was applied to the skin before the leg was wrapped with an Ace wrap to accommodate the Polar Care device. The patient was then awakened and returned to the recovery room in satisfactory condition after tolerating the procedure well.

## 2018-07-11 NOTE — Progress Notes (Signed)
New Admit  Mental Orientation: A&OX4, pt complains of 10/10 pain but drifts off to sleep while talking at this time. Will ask about vaccines when more awake.  Assessment: Pt has full sensation and can wiggle toes bilaterally. Diminished lung sounds, S1 S2 auscultated. Severe pain but drifts in and out of sleep. Will monitor. Skin: Surgical incision R knee, otherwise intact Safety Measures: Bed alarm on, in lowest position, pt oriented to call bells. 1A Orientation: Complete Family: at bedside, updated on POC.

## 2018-07-11 NOTE — NC FL2 (Signed)
[place] LEVEL OF CARE SCREENING TOOL     IDENTIFICATION  Patient Name: Carla [name] Birthdate: 1960/12/14 Sex: female Admission Date (Current Location): 07/11/2018  [facility] and [place] Number:  Selena Lesser ([id number]) Facility and Address:  [facility], [address], [place], [place] [address]      Provider Number: [id number]  Attending Physician Name and Address:  Corky Mull, MD  Relative Name and Phone Number:       Current Level of Care: Hospital Recommended Level of Care: [place] Prior Approval Number:    Date Approved/Denied:   PASRR Number: [id number])  Discharge Plan: SNF    Current Diagnoses: Patient Active Problem List   Diagnosis Date Noted  . Status post total knee replacement using cement, right 07/11/2018  . PAF (paroxysmal atrial fibrillation) ([place]) 06/15/2018  . Accelerated hypertension 04/08/2018  . Acute encephalopathy 04/08/2018  . Adjustment disorder with anxiety 08/15/2017  . Chronic idiopathic constipation   . Protein-calorie malnutrition, severe 08/08/2017  . SBO (small bowel obstruction) ([place]) 08/06/2017  . Chronic mesenteric ischemia ([place])   . AKI (acute kidney injury) ([place]) 07/29/2017  . Hypotension 07/29/2017  . 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[place])   . Abdominal pain   . Occlusive mesenteric ischemia ([place]) 06/19/2017  . 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 Acute focal ischemia of small intestine (HCC)   . Acute right-sided low back pain with right-sided sciatica 06/13/2017  . Primary osteoarthritis of both knees 06/13/2017  . Pain syndrome, chronic 09/13/2016  . Morbid obesity with BMI of 40.0-44.9, adult ([place]) 07/16/2016  . Personal history of other malignant neoplasm of skin 06/01/2016  . Chronic pain of both knees 01/19/2016  . HTN (hypertension) 01/19/2016  . Gastroesophageal reflux disease 01/19/2016  . Hyperlipidemia 01/19/2016  . Prediabetes  01/19/2016  . Recurrent major depressive disorder ([place]) 01/19/2016  . Urinary tract infection 01/09/2014  . Chronic pelvic pain in female 11/14/2013  . Mixed stress and urge urinary incontinence 11/14/2013  . Chest pain 08/06/2012    Orientation RESPIRATION BLADDER Height & Weight     Self, Situation, Time, Place  O2(2 Liters Oxygen. ) Continent Weight: 226 lb (102.5 kg) Height:  5\' 2"  (157.5 cm)  BEHAVIORAL SYMPTOMS/MOOD NEUROLOGICAL BOWEL NUTRITION STATUS      Continent Diet(Diet: Clear Liquid to be advanced. )  AMBULATORY STATUS COMMUNICATION OF NEEDS Skin   Extensive Assist Verbally Surgical wounds(Incision: Right Knee. )                       Personal Care Assistance Level of Assistance  Bathing, Feeding, Dressing Bathing Assistance: Limited assistance Feeding assistance: Independent Dressing Assistance: Limited assistance     Functional Limitations Info  Sight, Hearing, Speech Sight Info: Adequate Hearing Info: Adequate Speech Info: Adequate    SPECIAL CARE FACTORS FREQUENCY  PT (By licensed PT), OT (By licensed OT)     PT Frequency: (5) OT Frequency: (5)            Contractures      Additional Factors Info  Code Status, Allergies Code Status Info: (Full Code. ) Allergies Info: (No Known Allergies. )           Current Medications (07/11/2018):  This is the current hospital active medication list Current Facility-Administered Medications  Medication Dose Route Frequency Provider Last Rate Last Dose  . acetaminophen (TYLENOL) tablet 1,000 mg  1,000 mg Oral Q6H Poggi, Marshall Cork, MD  1,000 mg at 07/11/18 1240  . [START ON 07/12/2018] acetaminophen (TYLENOL) tablet 325-650 mg  325-650 mg Oral Q6H PRN Poggi, Marshall Cork, MD      . Derrill Memo ON 07/12/2018] amLODipine (NORVASC) tablet 5 mg  5 mg Oral Daily Poggi, Marshall Cork, MD      . Derrill Memo ON 07/12/2018] apixaban (ELIQUIS) tablet 5 mg  5 mg Oral BID Poggi, Marshall Cork, MD      . atorvastatin (LIPITOR) tablet 80 mg  80 mg  Oral QHS Poggi, Marshall Cork, MD      . bisacodyl (DULCOLAX) suppository 10 mg  10 mg Rectal Daily PRN Poggi, Marshall Cork, MD      . ceFAZolin (ANCEF) 2-4 GM/100ML-% IVPB           . ceFAZolin (ANCEF) 3 g in dextrose 5 % 50 mL IVPB  3 g Intravenous Q6H Poggi, Marshall Cork, MD 10 mL/hr at 07/11/18 1353 3 g at 07/11/18 1353  . diphenhydrAMINE (BENADRYL) 12.5 MG/5ML elixir 12.5-25 mg  12.5-25 mg Oral Q4H PRN Poggi, Marshall Cork, MD      . docusate sodium (COLACE) capsule 100 mg  100 mg Oral BID Poggi, Marshall Cork, MD      . Derrill Memo ON 07/12/2018] DULoxetine (CYMBALTA) DR capsule 30 mg  30 mg Oral Daily Poggi, Marshall Cork, MD      . ENSURE liquid 237 mL  237 mL Oral BID Corky Mull, MD   237 mL at 07/11/18 1200  . ferrous sulfate tablet 325 mg  325 mg Oral Daily Poggi, Marshall Cork, MD   325 mg at 07/11/18 1241  . HYDROmorphone (DILAUDID) injection 0.5-1 mg  0.5-1 mg Intravenous Q4H PRN Poggi, Marshall Cork, MD      . ketorolac (TORADOL) 15 MG/ML injection 15 mg  15 mg Intravenous Q6H Poggi, Marshall Cork, MD   15 mg at 07/11/18 1241  . magnesium hydroxide (MILK OF MAGNESIA) suspension 30 mL  30 mL Oral Daily PRN Poggi, Marshall Cork, MD      . metoCLOPramide (REGLAN) tablet 5-10 mg  5-10 mg Oral Q8H PRN Poggi, Marshall Cork, MD       Or  . metoCLOPramide (REGLAN) injection 5-10 mg  5-10 mg Intravenous Q8H PRN Poggi, Marshall Cork, MD      . metoprolol tartrate (LOPRESSOR) tablet 50 mg  50 mg Oral BID Poggi, Marshall Cork, MD      . ondansetron [facility]) tablet 4 mg  4 mg Oral Q6H PRN Poggi, Marshall Cork, MD   4 mg at 07/11/18 1241   Or  . ondansetron (ZOFRAN) injection 4 mg  4 mg Intravenous Q6H PRN Poggi, Marshall Cork, MD      . oxyCODONE (Oxy IR/ROXICODONE) immediate release tablet 5-10 mg  5-10 mg Oral Q4H PRN Poggi, Marshall Cork, MD   10 mg at 07/11/18 1347  . [START ON 07/12/2018] pantoprazole (PROTONIX) EC tablet 40 mg  40 mg Oral Daily Poggi, Marshall Cork, MD      . polyethylene glycol (MIRALAX / GLYCOLAX) packet 17 g  17 g Oral BID PRN Poggi, Marshall Cork, MD      . sodium phosphate (FLEET) 7-19  GM/118ML enema 1 enema  1 enema Rectal Once PRN Poggi, Marshall Cork, MD      . traMADol Veatrice Bourbon) tablet 50 mg  50 mg Oral Q6H PRN Poggi, Marshall Cork, MD   50 mg at 07/11/18 1241     Discharge Medications: Please see discharge summary for a list of  discharge medications.  Relevant Imaging Results:  Relevant Lab Results:   Additional Information (SSN: [id number])  Lorann Tani, Veronia Beets, LCSW

## 2018-07-11 NOTE — H&P (Signed)
Paper H&P to be scanned into permanent record. H&P reviewed and patient re-examined. No changes. 

## 2018-07-11 NOTE — Progress Notes (Signed)
PT Cancellation Note  Patient Details Name: Carla [name] MRN: [id number] DOB: 11/28/1960   Cancelled Treatment:     Attempted to see pt for initial examination POD0. Pt lethargic and refusing PT at this time with noted 10/10 pain with nurse notified. Explained to pt the importance of mobility and trying to participate in PT but pt still refusing. Obtained basic household information from family in room. Will re-attempt PT evaluation tomorrow.    Ernie Avena, SPT 07/11/2018, 3:34 PM

## 2018-07-11 NOTE — Anesthesia Post-op Follow-up Note (Signed)
Anesthesia QCDR form completed.        

## 2018-07-11 NOTE — Anesthesia Preprocedure Evaluation (Signed)
Anesthesia Evaluation  Patient identified by MRN, date of birth, ID band Patient awake    Reviewed: Allergy & Precautions, H&P , NPO status , reviewed documented beta blocker date and time   Airway Mallampati: III  TM Distance: >3 FB Neck ROM: full    Dental  (+) Edentulous Upper, Edentulous Lower   Pulmonary Current Smoker,   Crackles Bilat         Cardiovascular hypertension, + Peripheral Vascular Disease and +CHF  Normal cardiovascular exam  ECHO Study Conclusions  - Left ventricle: Wall thickness was increased in a pattern of mild   LVH. Systolic function was normal. The estimated ejection   fraction was in the range of 60% to 65%. Doppler parameters are   consistent with abnormal left ventricular relaxation (grade 1   diastolic dysfunction). - Aortic valve: Valve area (Vmax): 2.13 cm^2. No aortic stenosis   Neuro/Psych PSYCHIATRIC DISORDERS Depression  Neuromuscular disease    GI/Hepatic GERD  Medicated and Controlled,  Endo/Other  Morbid obesity  Renal/GU Renal disease     Musculoskeletal  (+) Arthritis ,   Abdominal   Peds  Hematology   Anesthesia Other Findings Past Medical History: No date: Abdominal aortic atherosclerosis ([place])     Comment:  a. 05/2017 CTA abd/pelvis: significant atherosclerotic               dzs of the infrarenal abd Ao w/ some mural thrombus. No               aneurysm or dissection. No date: Abdominal pain No date: Acute focal ischemia of small intestine ([place]) 06/13/2017: Acute right-sided low back pain with right-sided sciatica 07/29/2017: AKI (acute kidney injury) ([place]) No date: Baker's cyst of knee, right     Comment:  a. 07/2016 U/S: 4.1 x 1.4 x 2.9 cystic structure in R               poplitetal fossa. No date: Bell's palsy No date: Borderline diabetes No date: Cancer [facility])     Comment:  skin cancer on nose No date: Chest pain     Comment:  a. 08/2012 Lexiscan MV: EF 54%,  non ischemia/infarct. No date: CHF (congestive heart failure) (HCC) No date: Chronic idiopathic constipation No date: Chronic mesenteric ischemia (HCC) No date: Diastolic dysfunction     Comment:  a. 05/2017 Echo: Ef 60-65%, no rwma, Gr1 DD, no source of              cardiac emboli. No date: 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[place])     Comment:  a. 05/2017 CTA Abd/pelvis: apparent thrombus or embolus               in prox SMA (70-90%); b. 05/2017 catheter directed tPA,               mechanical thrombectomy, and stenting of the SMA. 01/19/2016: Essential hypertension with goal blood pressure less than  140/90 01/19/2016: Gastroesophageal reflux disease No date: GERD (gastroesophageal reflux disease) No date: History of kidney stones No date: Hyperlipidemia No date: Hypertension 07/29/2017: Hypotension No date: Morbid obesity with BMI of 40.0-44.9, adult ([place]) 06/19/2017: Occlusive mesenteric ischemia ([place]) 06/01/2016: Personal history of other malignant neoplasm of skin 06/13/2017: Primary osteoarthritis of both knees 01/19/2016: Recurrent major depressive disorder ([place]) 08/06/2017: SBO (small bowel obstruction) (HCC) No date: 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[place]) 01/09/2014: Urinary tract infection  Past Surgical History: No date: APPENDECTOMY No date: CHOLECYSTECTOMY 08/08/2017: LAPAROTOMY; N/A     Comment:  Procedure: EXPLORATORY LAPAROTOMY POSSIBLE BOWEL               RESECTION;  Surgeon: Jules Husbands, MD;  Location: [facility]               ORS;  Service: General;  Laterality: N/A; 06/22/2017: TEE WITHOUT CARDIOVERSION; N/A     Comment:  Procedure: TRANSESOPHAGEAL ECHOCARDIOGRAM (TEE);                Surgeon: Wellington Hampshire, MD;  Location: [facility] ORS;                Service: Cardiovascular;  Laterality: N/A; No date: VAGINAL HYSTERECTOMY 06/20/2017: VISCERAL ARTERY INTERVENTION; N/A     Comment:  Procedure: Visceral Artery Intervention, possible aortic               thrombectomy;  Surgeon: Algernon Huxley, MD;  Location: [place] CV LAB;  Service: Cardiovascular;  Laterality:               N/A; 01/28/2018: VISCERAL ARTERY INTERVENTION; N/A     Comment:  Procedure: VISCERAL ARTERY INTERVENTION;  Surgeon: Algernon Huxley, MD;  Location: [place] CV LAB;  Service:               Cardiovascular;  Laterality: N/A;  BMI    Body Mass Index:  41.34 kg/m      Reproductive/Obstetrics                             Anesthesia Physical Anesthesia Plan  ASA: IV  Anesthesia Plan: Spinal   Post-op Pain Management:    Induction:   PONV Risk Score and Plan: 1 and Treatment may vary due to age or medical condition, TIVA, Ondansetron and Midazolam  Airway Management Planned: Nasal Cannula and Natural Airway  Additional Equipment:   Intra-op Plan:   Post-operative Plan:   Informed Consent: I have reviewed the patients History and Physical, chart, labs and discussed the procedure including the risks, benefits and alternatives for the proposed anesthesia with the patient or authorized representative who has indicated his/her understanding and acceptance.   Dental Advisory Given  Plan Discussed with: CRNA  Anesthesia Plan Comments:         Anesthesia Quick Evaluation

## 2018-07-11 NOTE — Transfer of Care (Signed)
Immediate Anesthesia Transfer of Care Note  Patient: Carla [name]  Procedure(s) Performed: TOTAL KNEE ARTHROPLASTY (Right Knee)  Patient Location: PACU  Anesthesia Type:Spinal  Level of Consciousness: awake, alert  and oriented  Airway & Oxygen Therapy: Patient Spontanous Breathing  Post-op Assessment: Report given to RN  Post vital signs: Reviewed and stable  Last Vitals:  Vitals Value Taken Time  BP 129/104 07/11/2018 10:29 AM  Temp    Pulse 80 07/11/2018 10:30 AM  Resp 18 07/11/2018 10:30 AM  SpO2 96 % 07/11/2018 10:30 AM  Vitals shown include unvalidated device data.  Last Pain:  Vitals:   07/11/18 0614  TempSrc: Tympanic  PainSc: 9          Complications: No apparent anesthesia complications

## 2018-07-11 NOTE — Progress Notes (Signed)
Pt started stuttering words and became very sedated after 1 mg dilaudid. Brandi RN called to help assess pt for stroke. Pt is symmetrical. Pt says this has happened before and is related to bells palsy. Family says they have never seen it before . VSS. Continuous pulse ox applied to pt. MD made aware. PRN order for Narcan ordered if needed. Pt is stable at this time. Will monitor closely and hold sedating medications at this time.

## 2018-07-11 NOTE — Anesthesia Procedure Notes (Addendum)
Spinal  Staffing Anesthesiologist: Alphonsus Sias, MD Performed: anesthesiologist  Spinal Block Patient position: sitting Prep: ChloraPrep Patient monitoring: heart rate, cardiac monitor, continuous pulse ox and blood pressure Approach: right paramedian Location: L3-4 Needle Needle type: Pencil-Tip  Needle gauge: 22 G Needle length: 10 cm Additional Notes Attempted at L2/3 w 24g, unsuccessful. Moved to L3/4, 24g, unsuccessful. Changed to 22g pencil tip @ L3/4, easy CSF, clear 1st pass, tol well

## 2018-07-12 ENCOUNTER — Encounter: Payer: Self-pay | Admitting: Surgery

## 2018-07-12 LAB — CBC WITH DIFFERENTIAL/PLATELET
Basophils Absolute: 0 10*3/uL (ref 0–0.1)
Basophils Relative: 0 %
Eosinophils Absolute: 0 10*3/uL (ref 0–0.7)
Eosinophils Relative: 0 %
HCT: 29.6 % — ABNORMAL LOW (ref 35.0–47.0)
Hemoglobin: 9.7 g/dL — ABNORMAL LOW (ref 12.0–16.0)
Lymphocytes Relative: 15 %
Lymphs Abs: 1.7 10*3/uL (ref 1.0–3.6)
MCH: 25.8 pg — ABNORMAL LOW (ref 26.0–34.0)
MCHC: 32.9 g/dL (ref 32.0–36.0)
MCV: 78.5 fL — ABNORMAL LOW (ref 80.0–100.0)
Monocytes Absolute: 0.8 10*3/uL (ref 0.2–0.9)
Monocytes Relative: 7 %
Neutro Abs: 8.3 10*3/uL — ABNORMAL HIGH (ref 1.4–6.5)
Neutrophils Relative %: 78 %
Platelets: 310 10*3/uL (ref 150–440)
RBC: 3.76 MIL/uL — ABNORMAL LOW (ref 3.80–5.20)
RDW: 18.9 % — ABNORMAL HIGH (ref 11.5–14.5)
WBC: 10.8 10*3/uL (ref 3.6–11.0)

## 2018-07-12 LAB — BASIC METABOLIC PANEL
Anion gap: 9 (ref 5–15)
BUN: 16 mg/dL (ref 6–20)
CO2: 27 mmol/L (ref 22–32)
Calcium: 8.7 mg/dL — ABNORMAL LOW (ref 8.9–10.3)
Chloride: 102 mmol/L (ref 98–111)
Creatinine, Ser: 0.85 mg/dL (ref 0.44–1.00)
GFR calc Af Amer: >60 mL/min (ref >60)
GFR calc non Af Amer: >60 mL/min (ref >60)
Glucose, Bld: 142 mg/dL — ABNORMAL HIGH (ref 70–99)
Potassium: 4.1 mmol/L (ref 3.5–5.1)
Sodium: 138 mmol/L (ref 135–145)

## 2018-07-12 MED ORDER — TRAMADOL HCL 50 MG PO TABS
50.0000 mg | ORAL_TABLET | Freq: Four times a day (QID) | ORAL | Status: DC | PRN
  Administered 2018-07-12 – 2018-07-13 (×3): 100 mg via ORAL
  Administered 2018-07-13: 50 mg via ORAL
  Administered 2018-07-13 – 2018-07-15 (×6): 100 mg via ORAL
  Filled 2018-07-12 (×10): qty 2

## 2018-07-12 MED ORDER — OXYCODONE HCL 5 MG PO TABS: 5.0000 mg | ORAL_TABLET | ORAL | 0 refills | Status: DC | PRN

## 2018-07-12 MED ORDER — TRAMADOL HCL 50 MG PO TABS: 50.0000 mg | ORAL_TABLET | Freq: Four times a day (QID) | ORAL | 0 refills | Status: DC | PRN

## 2018-07-12 NOTE — Evaluation (Signed)
Physical Therapy Evaluation Patient Details Name: Carla [name] MRN: [id number] DOB: Mar 03, 1961 Today's Date: 07/12/2018   History of Present Illness  Pt is a 57 y.o female presenting S/P TKR. PMH significant for cancer, hyperlipidemia, hypertension, bell's palsy, embolus of superior mesenteric artery, SBO, and OA.  Clinical Impression  Pt alert and oriented with overall improved readiness to work PT this morning. Pt having elevated pain levels at rest of 8/10 although able to tolerate all supine exercises. Decent quad contraction noted during quad squeezes and SAQ, however pt unable to perform an active SLR. Pt unable to bring R leg off bed without assistance, but able to manage trunk independently. Poor safety and technique noted during sit to stand with pt requiring significant UE assist on walker as well as min assist from PT. During ambulation, pt relying heavily on UE, self limiting weight bearing through R LE, and step to gait pattern, but only CGA needed for safety. HR elevating to 116 during ambulation. Per continued progress, pt most appropriate for HHPT upon discharge. Pt would continue to benefit from skilled PT in hospital to promote functional improvements in preparation for discharge.     Follow Up Recommendations Home health PT    Equipment Recommendations  None recommended by PT    Recommendations for Other Services       Precautions / Restrictions Precautions Precautions: Fall Required Braces or Orthoses: Knee Immobilizer - Right Knee Immobilizer - Right: Discontinue once straight leg raise with < 10 degree lag Restrictions Weight Bearing Restrictions: Yes RLE Weight Bearing: Weight bearing as tolerated      Mobility  Bed Mobility Overal bed mobility: Needs Assistance Bed Mobility: Supine to Sit     Supine to sit: Min assist     General bed mobility comments: Pt with good effort requiring significant assistance to bring R leg off the side of the bed. Pt  requiring B UE assist to bring herself to upright sitting.   Transfers Overall transfer level: Needs assistance Equipment used: Rolling walker (2 wheeled) Transfers: Sit to/from Stand Sit to Stand: Min assist         General transfer comment: 2 attempts required to perform sit to stand transfer. Pt unable to perfrom sit to stand with B UE on bed and required 1 hand to push off RW. Min assist required to bring pt to full upright standing. Cuing for safety with transfer, proper UE placement on bed, and LE placement.   Ambulation/Gait Ambulation/Gait assistance: Min guard Gait Distance (Feet): 15 Feet Assistive device: Rolling walker (2 wheeled)(R KI ) Gait Pattern/deviations: Step-to pattern;Decreased step length - right;Decreased step length - left;Decreased stance time - right;Decreased stride length     General Gait Details: Good effort with short distance ambulation, however pt demonstrating short step length and heavily relying on UE assist to offload R leg. Pt significantly fatigued following 15 feet and needing to sit.   Stairs            Wheelchair Mobility    Modified Rankin (Stroke Patients Only)       Balance Overall balance assessment: Needs assistance Sitting-balance support: Feet supported;No upper extremity supported Sitting balance-[name]: Normal       Standing balance-[name]: Poor Standing balance comment: Pt able to assist with pulling up diaper in standing with 1 UE on RW for balance.  Pertinent Vitals/Pain Pain Score: 8  Pain Location: R knee Pain Descriptors / Indicators: Aching;Grimacing Pain Intervention(s): Limited activity within patient's tolerance;Monitored during session;Premedicated before session;Repositioned;Ice applied;Utilized relaxation techniques    Home Living Family/patient expects to be discharged to:: Private residence Living Arrangements: Spouse/significant other Available  Help at Discharge: Family Type of Home: (motel room ) Home Access: Level entry     Home Layout: One level Home Equipment: Environmental consultant - 2 wheels;Cane - single point;Bedside commode      Prior Function Level of Independence: Independent with assistive device(s)         Comments: Pt has been using a RW due to B knee pain and back pain     Hand Dominance        Extremity/Trunk Assessment   Upper Extremity Assessment Upper Extremity Assessment: Overall WFL for tasks assessed    Lower Extremity Assessment Lower Extremity Assessment: Generalized weakness(Pt able to perform R quad contraction but significantly limited at this time)       Communication   Communication: No difficulties  Cognition Arousal/Alertness: Awake/alert Behavior During Therapy: WFL for tasks assessed/performed Overall Cognitive Status: Within Functional Limits for tasks assessed                                        General Comments General comments (skin integrity, edema, etc.): HR at rest 73, 83 during supine exercise, 116 following 15 feet of ambulation. BP 143/75 during supine exercise. SpO2 remainig in low to mid 90's on RA.     Exercises Total Joint Exercises Ankle Circles/Pumps: AROM;Both;15 reps;Supine Quad Sets: AROM;Right;10 reps;Supine Gluteal Sets: AROM;Both;15 reps;Supine Towel Squeeze: AROM;Both;10 reps;Supine Short Arc Quad: AAROM;Right;10 reps;Supine(5 AAROM, 5 AROM) Hip ABduction/ADduction: AAROM;Right;10 reps;Supine Straight Leg Raises: AAROM;Right;5 reps;Supine Knee Flexion: AAROM;Right;10 reps;Supine Goniometric ROM: Knee ROM 3-70   Assessment/Plan    PT Assessment Patient needs continued PT services  PT Problem List Decreased strength;Decreased mobility;Decreased range of motion;Decreased activity tolerance;Decreased balance;Decreased knowledge of use of DME;Decreased safety awareness       PT Treatment Interventions DME instruction;Therapeutic  activities;Modalities;Gait training;Therapeutic exercise;Stair training;Balance training;Patient/family education;Functional mobility training    PT Goals (Current goals can be found in the Care Plan section)  Acute Rehab PT Goals Patient Stated Goal: To be reduce pain and return home PT Goal Formulation: With patient Time For Goal Achievement: 07/26/18 Potential to Achieve Goals: Good    Frequency BID   Barriers to discharge        Co-evaluation               AM-PAC PT "6 Clicks" Daily Activity  Outcome Measure Difficulty turning over in bed (including adjusting bedclothes, sheets and blankets)?: Unable Difficulty moving from lying on back to sitting on the side of the bed? : Unable Difficulty sitting down on and standing up from a chair with arms (e.g., wheelchair, bedside commode, etc,.)?: Unable Help needed moving to and from a bed to chair (including a wheelchair)?: A Little Help needed walking in hospital room?: A Little Help needed climbing 3-5 steps with a railing? : Total 6 Click Score: 10    End of Session Equipment Utilized During Treatment: Gait belt;Right knee immobilizer Activity Tolerance: Patient limited by pain Patient left: in chair;with call bell/phone within reach;with chair alarm set;with family/visitor present   PT Visit Diagnosis: Unsteadiness on feet (R26.81);Muscle weakness (generalized) (M62.81);Difficulty in walking, not elsewhere classified (R26.2);Pain Pain -  Right/Left: Right Pain - part of body: Knee    Time: 7290-2111 PT Time Calculation (min) (ACUTE ONLY): 53 min   Charges:              Ernie Avena, SPT 07/12/2018, 12:28 PM

## 2018-07-12 NOTE — Discharge Instructions (Signed)
Diet: As you were doing prior to hospitalization  ° °Shower:  May shower but keep the wounds dry, use an occlusive plastic wrap, NO SOAKING IN TUB.  If the bandage gets wet, change with a clean dry gauze. ° °Dressing:  You may change your dressing as needed. Change the dressing with sterile gauze dressing.   ° °Activity:  Increase activity slowly as tolerated, but follow the weight bearing instructions below.  No lifting or driving for 6 weeks. ° °Weight Bearing:   Weight bearing as tolerated to right lower extremity ° °To prevent constipation: you may use a stool softener such as - ° °Colace (over the counter) 100 mg by mouth twice a day  °Drink plenty of fluids (prune juice may be helpful) and high fiber foods °Miralax (over the counter) for constipation as needed.   ° °Itching:  If you experience itching with your medications, try taking only a single pain pill, or even half a pain pill at a time.  You may take up to 10 pain pills per day, and you can also use benadryl over the counter for itching or also to help with sleep.  ° °Precautions:  If you experience chest pain or shortness of breath - call 911 immediately for transfer to the hospital emergency department!! ° °If you develop a fever greater that 101 F, purulent drainage from wound, increased redness or drainage from wound, or calf pain-Call [facility]                                              °Follow- Up Appointment:  Please call for an appointment to be seen in 2 weeks at [facility] °

## 2018-07-12 NOTE — Progress Notes (Addendum)
Subjective: 1 Day Post-Op Procedure(s) (LRB): TOTAL KNEE ARTHROPLASTY (Right) Patient reports pain as 8 on 0-10 scale.   Patient is well, but has had some minor complaints of pain PT and care management to assist with discharge planning. Negative for chest pain and shortness of breath Fever: Temp 99 last night. Gastrointestinal:Negative for nausea and vomiting  Objective: Vital signs in last 24 hours: Temp:  [97 F (36.1 C)-99 F (37.2 C)] 99 F (37.2 C) (09/20 0409) Pulse Rate:  [75-101] 85 (09/20 0409) Resp:  [11-20] 19 (09/20 0409) BP: (129-167)/(76-104) 167/85 (09/20 0409) SpO2:  [92 %-99 %] 92 % (09/20 0409)  Intake/Output from previous day:  Intake/Output Summary (Last 24 hours) at 07/12/2018 0745 Last data filed at 07/12/2018 0506 Gross per 24 hour  Intake 516.03 ml  Output 1235 ml  Net -718.97 ml    Intake/Output this shift: No intake/output data recorded.  Labs: Recent Labs    07/12/18 0423  HGB 9.7*   Recent Labs    07/12/18 0423  WBC 10.8  RBC 3.76*  HCT 29.6*  PLT 310   Recent Labs    07/12/18 0423  NA 138  K 4.1  CL 102  CO2 27  BUN 16  CREATININE 0.85  GLUCOSE 142*  CALCIUM 8.7*   No results for input(s): LABPT, INR in the last 72 hours.   EXAM General - Patient is Alert, Appropriate and Oriented Extremity - ABD soft Sensation intact distally Intact pulses distally Dorsiflexion/Plantar flexion intact Incision: dressing C/D/I No cellulitis present Dressing/Incision - clean, dry, no drainage Motor Function - intact, moving foot and toes well on exam.  Abdomen soft with normal BS this AM, no tympany.  Past Medical History:  Diagnosis Date  . Abdominal aortic atherosclerosis ([place])    a. 05/2017 CTA abd/pelvis: significant atherosclerotic dzs of the infrarenal abd Ao w/ some mural thrombus. No aneurysm or dissection.  . Abdominal pain   . Acute focal ischemia of small intestine (H[facility])   . Acute right-sided low back pain with  right-sided sciatica 06/13/2017  . AKI (acute kidney injury) ([place]) 07/29/2017  . Baker's cyst of knee, right    a. 07/2016 U/S: 4.1 x 1.4 x 2.9 cystic structure in R poplitetal fossa.  . Bell's palsy   . Borderline diabetes   . Cancer ([place])    skin cancer on nose  . Chest pain    a. 08/2012 Lexiscan MV: EF 54%, non ischemia/infarct.  . CHF (congestive heart failure) ([place])   . Chronic idiopathic constipation   . Chronic mesenteric ischemia ([place])   . Diastolic dysfunction    a. 05/2017 Echo: Ef 60-65%, no rwma, Gr1 DD, no source of cardiac emboli.  . 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 05/2017 CTA Abd/pelvis: apparent thrombus or embolus in prox SMA (70-90%); b. 05/2017 catheter directed tPA, mechanical thrombectomy, and stenting of the SMA.  . Essential hypertension with goal blood pressure less than 140/90 01/19/2016  . Gastroesophageal reflux disease 01/19/2016  . GERD (gastroesophageal reflux disease)   . History of kidney stones   . Hyperlipidemia   . Hypertension   . Hypotension 07/29/2017  . Morbid obesity with BMI of 40.0-44.9, adult ([place])   . Occlusive mesenteric ischemia ([place]) 06/19/2017  . Personal history of other malignant neoplasm of skin 06/01/2016  . Primary osteoarthritis of both knees 06/13/2017  . Recurrent major depressive disorder ([place]) 01/19/2016  . SBO (small bowel obstruction) ([place]) 08/06/2017  . 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 Urinary tract infection 01/09/2014    Assessment/Plan: 1 Day Post-Op Procedure(s) (LRB): TOTAL KNEE ARTHROPLASTY (Right) Active Problems:   Status post total knee replacement using cement, right  Estimated body mass index is 41.34 kg/m as calculated from the following:   Height as of this encounter: 5\' 2"  (1.575 m).   Weight as of this encounter: 102.5 kg. Advance diet Up with therapy D/C IV fluids when tolerating po diet.  Labs reviewed this AM.  CBC and BMP ordered for tomorrow. Pt refused PT yesterday,  encouraged her to participate today, informed her that PT is what is going to help her regain function of her right knee and overall decrease her pain. Begin working on a BM.  DVT Prophylaxis - Foot Pumps, TED hose and Eliquis Weight-Bearing as tolerated to right leg  J. Cameron Proud, PA-C [facility] 07/12/2018, 7:45 AM

## 2018-07-12 NOTE — Progress Notes (Signed)
Physical Therapy Treatment Patient Details Name: Carla [name] MRN: [id number] DOB: 1961-05-10 Today's Date: 07/12/2018    History of Present Illness Pt is a 57 y.o female presenting S/P TKR. PMH significant for cancer, hyperlipidemia, hypertension, bell's palsy, embolus of superior mesenteric artery, SBO, and OA.    PT Comments    Initially attempted to see pt and found her crying in severe pain despite receiving pain medication 1 hour before arrival, pain not improved. Notified RN who was able to give IV Dilaudid prior to session. Pain was a severe limiter this afternoon. Pt reporting 7/10 at rest prior to activity with PT. Decreased strength and quad contraction noted during all exercises this afternoon. Knee ROM not formally taken this afternoon, however noticeable decrease in knee flexion and extension observed. Decreased mobility and increased assistance required for sit to stand limited by pain and strength. Pt ambulating similar to this morning with slightly improved WBing through R leg, but increased pain and HR reaching 138bpm after 15 feet. Due to recent decline in mobility, strength, ROM, and increased pain pt most appropriate for STR.   Follow Up Recommendations  SNF     Equipment Recommendations  None recommended by PT    Recommendations for Other Services       Precautions / Restrictions Precautions Precautions: Fall Required Braces or Orthoses: Knee Immobilizer - Right Knee Immobilizer - Right: Discontinue once straight leg raise with < 10 degree lag Restrictions Weight Bearing Restrictions: Yes RLE Weight Bearing: Weight bearing as tolerated    Mobility  Bed Mobility Overal bed mobility: Needs Assistance Bed Mobility: Supine to Sit     Supine to sit: Min assist     General bed mobility comments: Continued assistance required to bring R leg off of bed. Continued limitations due to pain and significant B UE assist required.   Transfers Overall transfer level:  Needs assistance Equipment used: Rolling walker (2 wheeled) Transfers: Sit to/from Stand Sit to Stand: Mod assist         General transfer comment: Increased difficulty this afternoon with sit to stand. Pt unable to stand from lowest height after multiple attempts; bed raised about 3in to faciliate transfer. Increased assistance required due to pt having increased dificulty. Pt continues to need to utilize 1 UE on bed and 1 UE on RW for assist.   Ambulation/Gait Ambulation/Gait assistance: Min guard Gait Distance (Feet): 15 Feet Assistive device: Rolling walker (2 wheeled) Gait Pattern/deviations: Step-to pattern;Decreased step length - right;Decreased step length - left;Decreased stance time - right;Decreased stride length     General Gait Details: Good effort noted and slightly improved WBing through R LE, however limited by severe pain, emotional distress, and concerns of HR reaching 138bpm. Continued fatigue following short ambulation noted.    Stairs             Wheelchair Mobility    Modified Rankin (Stroke Patients Only)       Balance Overall balance assessment: Needs assistance Sitting-balance support: Feet supported;No upper extremity supported Sitting balance-[name]: Normal       Standing balance-[name]: Poor Standing balance comment: Pt able to assist with pulling up diaper in standing with 1 UE on RW for balance.                             Cognition Arousal/Alertness: Awake/alert Behavior During Therapy: WFL for tasks assessed/performed Overall Cognitive Status: Within Functional Limits for tasks assessed  Exercises Total Joint Exercises Quad Sets: AROM;Right;10 reps;Supine Short Arc Quad: AAROM;Right;10 reps;Supine(5 AAROM, 5 AROM) Hip ABduction/ADduction: AAROM;Right;10 reps;Supine Knee Flexion: AAROM;Right;10 reps;Supine    General Comments        Pertinent  Vitals/Pain Pain Assessment: 0-10 Pain Score: 7 (crying due to pain upon arrival) Pain Location: R knee Pain Descriptors / Indicators: Aching;Grimacing;Crying Pain Intervention(s): Limited activity within patient's tolerance;Monitored during session;Premedicated before session;Patient requesting pain meds-RN notified;Ice applied;Utilized relaxation techniques    Home Living                      Prior Function            PT Goals (current goals can now be found in the care plan section) Progress towards PT goals: Not progressing toward goals - comment(pt with decline in mobility, strength, ROM, and mobility)    Frequency    BID      PT Plan      Co-evaluation              AM-PAC PT "6 Clicks" Daily Activity  Outcome Measure  Difficulty turning over in bed (including adjusting bedclothes, sheets and blankets)?: Unable Difficulty moving from lying on back to sitting on the side of the bed? : Unable Difficulty sitting down on and standing up from a chair with arms (e.g., wheelchair, bedside commode, etc,.)?: Unable Help needed moving to and from a bed to chair (including a wheelchair)?: A Lot Help needed walking in hospital room?: A Little Help needed climbing 3-5 steps with a railing? : Total 6 Click Score: 9    End of Session Equipment Utilized During Treatment: Gait belt;Right knee immobilizer Activity Tolerance: Patient limited by pain Patient left: with call bell/phone within reach;with family/visitor present;in bed;with bed alarm set;with SCD's reapplied Nurse Communication: Mobility status(pain levels) PT Visit Diagnosis: Unsteadiness on feet (R26.81);Muscle weakness (generalized) (M62.81);Difficulty in walking, not elsewhere classified (R26.2);Pain Pain - Right/Left: Right Pain - part of body: Knee     Time: 4142-3953 PT Time Calculation (min) (ACUTE ONLY): 35 min  Charges:                        Ernie Avena, SPT 07/12/2018, 4:43 PM

## 2018-07-12 NOTE — Anesthesia Postprocedure Evaluation (Addendum)
Anesthesia Post Note  Patient: Carla [name]  Procedure(s) Performed: TOTAL KNEE ARTHROPLASTY (Right Knee)  Patient location during evaluation: Nursing Unit Anesthesia Type: Spinal Level of consciousness: awake and alert and oriented Pain management: satisfactory to patient Vital Signs Assessment: post-procedure vital signs reviewed and stable Respiratory status: respiratory function stable Cardiovascular status: stable Anesthetic complications: no     Last Vitals:  Vitals:   07/11/18 2342 07/12/18 0409  BP: (!) 152/98 (!) 167/85  Pulse: 93 85  Resp: 18 19  Temp: 36.8 C 37.2 C  SpO2: 97% 92%    Last Pain:  Vitals:   07/12/18 0503  TempSrc:   PainSc: 7                  Blima Singer

## 2018-07-12 NOTE — Clinical Social Work Note (Signed)
Clinical Social Work Assessment  Patient Details  Name: Carla [name] MRN: [id number] Date of Birth: 1961/02/17  Date of referral:  07/12/18               Reason for consult:  Facility Placement                Permission sought to share information with:    Permission granted to share information::     Name::        Agency::     Relationship::     Contact Information:     Housing/Transportation Living arrangements for the past 2 months:  Single Family Home Source of Information:    Patient Interpreter Needed:  None Criminal Activity/Legal Involvement Pertinent to Current Situation/Hospitalization:  No - Comment as needed Significant Relationships:  Significant Other, Other Family Members Lives with:  Significant Other Do you feel safe going back to the place where you live?  Yes Need for family participation in patient care:  Yes (Comment)  Care giving concerns:  Patient lives in [place] with her significant other [place].    Social Worker assessment / plan:  Holiday representative (CSW) received SNF consult. This morning PT recommended home health however this afternoon PT changed recommendation to SNF. CSW met with patient and her granddaughter was at bedside. Patient was alert and oriented X4 and was laying in the bed. CSW introduced self and explained role of CSW department. Per patient she lives in [place] with her significant other Bobby. CSW explained that PT is now recommending SNF. CSW explained the benefits of SNF. Patient refused SNF and stated that she will go home. Patient reported that she has a lot of support at home including her boyfriend and granddaughter. RN case manager aware of above. CSW will continue to follow and assist as needed.   Employment status:  Disabled (Comment on whether or not currently receiving Disability) Insurance information:  Medicare, Medicaid In [place] PT Recommendations:  [place], Home with [place] / Referral to  community resources:  Skilled Nursing Facility(Patient refused SNF. )  Patient/Family's Response to care:  Patient refused SNF.   Patient/Family's Understanding of and Emotional Response to Diagnosis, Current Treatment, and Prognosis:  Patient was very pleasant and thanked CSW for assistance.   Emotional Assessment Appearance:  Appears stated age Attitude/Demeanor/Rapport:    Affect (typically observed):  Accepting, Adaptable, Pleasant Orientation:  Oriented to Self, Oriented to Place, Oriented to  Time, Oriented to Situation Alcohol / Substance use:  Not Applicable Psych involvement (Current and /or in the community):  No (Comment)  Discharge Needs  Concerns to be addressed:  Discharge Planning Concerns Readmission within the last 30 days:  No Current discharge risk:  Dependent with Mobility Barriers to Discharge:  Continued Medical Work up   [employer], Veronia Beets, LCSW 07/12/2018, 4:28 PM

## 2018-07-12 NOTE — Progress Notes (Signed)
Pt more alert. Pt could not tolerate CPM because of uncontrolled pain. She didn't tolerate sitting on side of bed well. IV right hand infiltrated. Polar care in place with woundvac @ 125. No acute distress noted will continue to monitor
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
## 2018-07-13 ENCOUNTER — Inpatient Hospital Stay: Payer: Medicare Other

## 2018-07-13 LAB — BASIC METABOLIC PANEL
Anion gap: 8 (ref 5–15)
BUN: 13 mg/dL (ref 6–20)
CO2: 30 mmol/L (ref 22–32)
Calcium: 8.6 mg/dL — ABNORMAL LOW (ref 8.9–10.3)
Chloride: 99 mmol/L (ref 98–111)
Creatinine, Ser: 0.67 mg/dL (ref 0.44–1.00)
GFR calc Af Amer: >60 mL/min (ref >60)
GFR calc non Af Amer: >60 mL/min (ref >60)
Glucose, Bld: 123 mg/dL — ABNORMAL HIGH (ref 70–99)
Potassium: 4.5 mmol/L (ref 3.5–5.1)
Sodium: 137 mmol/L (ref 135–145)

## 2018-07-13 MED ORDER — METHOCARBAMOL 500 MG PO TABS
500.0000 mg | ORAL_TABLET | Freq: Four times a day (QID) | ORAL | Status: DC | PRN
  Administered 2018-07-13: 500 mg via ORAL
  Filled 2018-07-13: qty 1

## 2018-07-13 MED ORDER — INFLUENZA VAC SPLIT QUAD 0.5 ML IM SUSY
0.5000 mL | PREFILLED_SYRINGE | INTRAMUSCULAR | Status: AC
  Administered 2018-07-14: 0.5 mL via INTRAMUSCULAR
  Filled 2018-07-13: qty 0.5

## 2018-07-13 NOTE — Progress Notes (Signed)
Physical Therapy Treatment Patient Details Name: Carla [name] MRN: [id number] DOB: 01/11/1961 Today's Date: 07/13/2018    History of Present Illness Pt is a 57 y.o female presenting S/P TKR. PMH significant for cancer, hyperlipidemia, hypertension, bell's palsy, embolus of superior mesenteric artery, SBO, and OA.    PT Comments    Pt agreeable to PT; continues with 9/10 pain R knee. Pt found again lying in bed with towel roll under R knee; once again educated on the need to avoid maintaining knee is small bend and importance of working on gaining full R knee extension. Pt notes this hurts. Pt continues to be very upset/crying throughout session. Pt demonstrates sitting to edge of bed, active R knee flexion stretch, and STS. Min A for STS with very unorthodox approach despite education, see flowsheet comment. Minimal tolerance of weight bearing on RLE. Pt returned to bed with Min A. Distal RLE re assessed with continued firm swelling and noted warmth as compared to L (warmth not present in morning session). Pt asked to DF R foot; pt responds with increased crying and pain. No further treatment attempts. Spoke with nurse regarding concern for possible R DVT; nurse to call MD. Continue PT as appropriate per doppler results.    Follow Up Recommendations  SNF     Equipment Recommendations  None recommended by PT    Recommendations for Other Services       Precautions / Restrictions Precautions Precautions: Fall Knee Immobilizer - Right: Discontinue once straight leg raise with < 10 degree lag Restrictions Weight Bearing Restrictions: Yes RLE Weight Bearing: Weight bearing as tolerated    Mobility  Bed Mobility Overal bed mobility: Needs Assistance Bed Mobility: Rolling;Supine to Sit;Sit to Supine Rolling: Modified independent (Device/Increase time)   Supine to sit: Min assist Sit to supine: Min assist   General bed mobility comments: for RLE  Transfers Overall transfer level:  Needs assistance Equipment used: Rolling walker (2 wheeled) Transfers: Sit to/from Stand Sit to Stand: Min assist         General transfer comment: Education on proper technique/hand placement to allow for weight shift that would assist pt; however, pt ultimately performs unorthodox technique with Min A (both hands on rw, moving to hands on front of walker to forearms on handles then to stand; body twisted with LLE facing front of walker)  Ambulation/Gait             General Gait Details: unable   Stairs             Wheelchair Mobility    Modified Rankin (Stroke Patients Only)       Balance Overall balance assessment: Needs assistance Sitting-balance support: Bilateral upper extremity supported;Feet supported Sitting balance-[name]: Good     Standing balance support: Bilateral upper extremity supported Standing balance-[name]: Fair                              Cognition Arousal/Alertness: Awake/alert Behavior During Therapy: Anxious;WFL for tasks assessed/performed Overall Cognitive Status: Within Functional Limits for tasks assessed                                        Exercises Total Joint Exercises Ankle Circles/Pumps: AROM;Both;20 reps;Supine Quad Sets: Strengthening;Both;10 reps;Standing(with mild weight shift ) Gluteal Sets: Strengthening;Both;10 reps;Standing Heel Slides: AAROM;Right;20 reps;Supine Knee Flexion: AROM;Right;10 reps;Seated Goniometric  ROM: 6-70 Other Exercises Other Exercises: Re bandaged R ace due to ace being too tight causing demarkation and tightness/swelling distal to wrap. Also rolled sock for same purpose    General Comments        Pertinent Vitals/Pain Pain Assessment: 0-10 Pain Score: 9  Pain Location: R knee Pain Descriptors / Indicators: Aching;Grimacing;Constant;Moaning;Throbbing;Tightness;Crying Pain Intervention(s): Monitored during session;Limited activity within  patient's tolerance;Premedicated before session    Home Living                      Prior Function            PT Goals (current goals can now be found in the care plan section) Progress towards PT goals: Progressing toward goals(slowly)    Frequency    BID      PT Plan Current plan remains appropriate    Co-evaluation              AM-PAC PT "6 Clicks" Daily Activity  Outcome Measure  Difficulty turning over in bed (including adjusting bedclothes, sheets and blankets)?: A Little Difficulty moving from lying on back to sitting on the side of the bed? : Unable Difficulty sitting down on and standing up from a chair with arms (e.g., wheelchair, bedside commode, etc,.)?: Unable Help needed moving to and from a bed to chair (including a wheelchair)?: A Lot Help needed walking in hospital room?: A Lot Help needed climbing 3-5 steps with a railing? : Total 6 Click Score: 10    End of Session   Activity Tolerance: Patient limited by pain Patient left: in bed;with call bell/phone within reach;with bed alarm set;with family/visitor present   PT Visit Diagnosis: Unsteadiness on feet (R26.81);Muscle weakness (generalized) (M62.81);Difficulty in walking, not elsewhere classified (R26.2);Pain Pain - Right/Left: Right Pain - part of body: Knee     Time: 1355-1421 PT Time Calculation (min) (ACUTE ONLY): 26 min  Charges:  $Therapeutic Exercise: 8-22 mins $Therapeutic Activity: 8-22 mins                      Larae Grooms, PTA 07/13/2018, 2:55 PM

## 2018-07-13 NOTE — Progress Notes (Signed)
Patient resting in bed, dressing dry and intact on right knee. Polar care in place. Call bell in reach, bed in lowest position. Continue to monitor.

## 2018-07-13 NOTE — Plan of Care (Signed)
  Problem: Education: Goal: Knowledge of General Education information will improve Description: Including pain rating scale, medication(s)/side effects and non-pharmacologic comfort measures Outcome: Progressing   Problem: Health Behavior/Discharge Planning: Goal: Ability to manage health-related needs will improve Outcome: Progressing   Problem: Clinical Measurements: Goal: Ability to maintain clinical measurements within normal limits will improve Outcome: Progressing Goal: Will remain free from infection Outcome: Progressing Goal: Diagnostic test results will improve Outcome: Progressing Goal: Respiratory complications will improve Outcome: Progressing Goal: Cardiovascular complication will be avoided Outcome: Progressing   Problem: Activity: Goal: Risk for activity intolerance will decrease Outcome: Progressing   Problem: Nutrition: Goal: Adequate nutrition will be maintained Outcome: Progressing   Problem: Coping: Goal: Level of anxiety will decrease Outcome: Progressing   Problem: Elimination: Goal: Will not experience complications related to bowel motility Outcome: Progressing Goal: Will not experience complications related to urinary retention Outcome: Progressing   Problem: Pain Managment: Goal: General experience of comfort will improve Outcome: Progressing   Problem: Safety: Goal: Ability to remain free from injury will improve Outcome: Progressing   Problem: Skin Integrity: Goal: Risk for impaired skin integrity will decrease Outcome: Progressing   Problem: Education: Goal: Knowledge of the prescribed therapeutic regimen will improve Outcome: Progressing Goal: Individualized Educational Video(s) Outcome: Progressing   Problem: Activity: Goal: Ability to avoid complications of mobility impairment will improve Outcome: Progressing Goal: Range of joint motion will improve Outcome: Progressing   Problem: Clinical Measurements: Goal:  Postoperative complications will be avoided or minimized Outcome: Progressing   Problem: Pain Management: Goal: Pain level will decrease with appropriate interventions Outcome: Progressing   Problem: Skin Integrity: Goal: Will show signs of wound healing Outcome: Progressing   

## 2018-07-13 NOTE — Progress Notes (Signed)
Physical Therapy Treatment Patient Details Name: Carla [name] MRN: [id number] DOB: [birth year] Today's Date: 07/13/2018    History of Present Illness Pt is a 57 y.o female presenting S/P TKR. PMH significant for cancer, hyperlipidemia, hypertension, bell's palsy, embolus of superior mesenteric artery, SBO, and OA.    PT Comments    Pt agreeable to PT; however, tearful/anxious due to R knee pain. Currently reports 9/10 pain. R ace wrap noted to be quite tight with line of demarcation; also noted at ankle sock line. Bandage re wrapped and socks rolled with improved comfort. Pt also noted upon entry to have towel roll under R knee for comfort; removed and pt educated on avoiding and purpose of keeping R knee straight. Pt states understanding and with comply moving forward. R knee range becoming more limited (6-55 degrees); educated on need for gradual work on range throughout the day for range to improve and ultimately proper healing with improved comfort. Pt participates with basic exercises; encouraged work on these exercises throughout the day. Pt unable to get to the edge of the bed this session due to increasing pain/crying; discussed goal for this this afternoon's session. Pt agreeable. Continue PT to progress compliance, range, strength to improve functional mobility.    Follow Up Recommendations  SNF     Equipment Recommendations  None recommended by PT    Recommendations for Other Services       Precautions / Restrictions Precautions Precautions: Fall Knee Immobilizer - Right: Discontinue once straight leg raise with < 10 degree lag Restrictions Weight Bearing Restrictions: Yes RLE Weight Bearing: Weight bearing as tolerated    Mobility  Bed Mobility Overal bed mobility: Needs Assistance Bed Mobility: Rolling Rolling: Modified independent (Device/Increase time)         General bed mobility comments: Use of rails to roll; too much pain to come to sit at edge of bed this  session  Transfers                    Ambulation/Gait                 Stairs             Wheelchair Mobility    Modified Rankin (Stroke Patients Only)       Balance                                            Cognition Arousal/Alertness: Awake/alert Behavior During Therapy: Anxious;WFL for tasks assessed/performed Overall Cognitive Status: Within Functional Limits for tasks assessed                                        Exercises Total Joint Exercises Ankle Circles/Pumps: AROM;Both;20 reps;Supine Quad Sets: Strengthening;Both;20 reps;Supine Gluteal Sets: Strengthening;Both;20 reps;Supine Heel Slides: AAROM;Right;20 reps;Supine Goniometric ROM: 6-55 Other Exercises Other Exercises: Re bandaged R ace due to ace being too tight causing demarkation and tightness/swelling distal to wrap. Also rolled sock for same purpose    General Comments        Pertinent Vitals/Pain Pain Assessment: 0-10 Pain Score: 9  Pain Location: R knee Pain Descriptors / Indicators: Aching;Grimacing;Constant;Moaning;Throbbing;Tightness;Crying Pain Intervention(s): Monitored during session;Limited activity within patient's tolerance;Premedicated before session    Home Living  Prior Function            PT Goals (current goals can now be found in the care plan section) Progress towards PT goals: Not progressing toward goals - comment    Frequency    BID      PT Plan Current plan remains appropriate    Co-evaluation              AM-PAC PT "6 Clicks" Daily Activity  Outcome Measure  Difficulty turning over in bed (including adjusting bedclothes, sheets and blankets)?: A Little Difficulty moving from lying on back to sitting on the side of the bed? : Unable Difficulty sitting down on and standing up from a chair with arms (e.g., wheelchair, bedside commode, etc,.)?: Unable Help needed  moving to and from a bed to chair (including a wheelchair)?: A Lot Help needed walking in hospital room?: A Lot Help needed climbing 3-5 steps with a railing? : Total 6 Click Score: 10    End of Session   Activity Tolerance: Patient limited by pain Patient left: in bed;with call bell/phone within reach;with bed alarm set;with family/visitor present   PT Visit Diagnosis: Unsteadiness on feet (R26.81);Muscle weakness (generalized) (M62.81);Difficulty in walking, not elsewhere classified (R26.2);Pain Pain - Right/Left: Right Pain - part of body: Knee     Time: 1021-1173 PT Time Calculation (min) (ACUTE ONLY): 23 min  Charges:  $Therapeutic Exercise: 23-37 mins                      Larae Grooms, PTA 07/13/2018, 11:58 AM

## 2018-07-13 NOTE — Progress Notes (Signed)
Subjective: 2 Days Post-Op Procedure(s) (LRB): TOTAL KNEE ARTHROPLASTY (Right) Patient reports pain as 8 on 0-10 scale.   Patient is well, and has had no acute complaints or problems Negative for chest pain and shortness of breath Fever: Temp 99.2 last night. Gastrointestinal:Negative for nausea and vomiting, abd pain  Objective: Vital signs in last 24 hours: Temp:  [98 F (36.7 C)-99.2 F (37.3 C)] 99.2 F (37.3 C) (09/21 0058) Pulse Rate:  [78-98] 98 (09/21 0058) Resp:  [0-20] 20 (09/21 0058) BP: (129-156)/(78-87) 129/78 (09/21 0058) SpO2:  [92 %-96 %] 92 % (09/21 0058)  Intake/Output from previous day:  Intake/Output Summary (Last 24 hours) at 07/13/2018 0832 Last data filed at 07/12/2018 2007 Gross per 24 hour  Intake 480 ml  Output 350 ml  Net 130 ml    Intake/Output this shift: No intake/output data recorded.  Labs: Recent Labs    07/12/18 0423  HGB 9.7*   Recent Labs    07/12/18 0423  WBC 10.8  RBC 3.76*  HCT 29.6*  PLT 310   Recent Labs    07/12/18 0423 07/13/18 0422  NA 138 137  K 4.1 4.5  CL 102 99  CO2 27 30  BUN 16 13  CREATININE 0.85 0.67  GLUCOSE 142* 123*  CALCIUM 8.7* 8.6*   No results for input(s): LABPT, INR in the last 72 hours.   EXAM General - Patient is Alert, Appropriate and Oriented Extremity - ABD soft Sensation intact distally Intact pulses distally Dorsiflexion/Plantar flexion intact Incision: dressing C/D/I No cellulitis present Dressing/Incision - clean, dry, no drainage Motor Function - intact, moving foot and toes well on exam.    Past Medical History:  Diagnosis Date  . Abdominal aortic atherosclerosis ([place])    a. 05/2017 CTA abd/pelvis: significant atherosclerotic dzs of the infrarenal abd Ao w/ some mural thrombus. No aneurysm or dissection.  . Abdominal pain   . Acute focal ischemia of small intestine (HCC)   . Acute right-sided low back pain with right-sided sciatica 06/13/2017  . AKI (acute kidney  injury) ([place]) 07/29/2017  . Baker's cyst of knee, right    a. 07/2016 U/S: 4.1 x 1.4 x 2.9 cystic structure in R poplitetal fossa.  . Bell's palsy   . Borderline diabetes   . Cancer ([place])    skin cancer on nose  . Chest pain    a. 08/2012 Lexiscan MV: EF 54%, non ischemia/infarct.  . CHF (congestive heart failure) ([place])   . Chronic idiopathic constipation   . Chronic mesenteric ischemia ([place])   . Diastolic dysfunction    a. 05/2017 Echo: Ef 60-65%, no rwma, Gr1 DD, no source of cardiac emboli.  . 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 05/2017 CTA Abd/pelvis: apparent thrombus or embolus in prox SMA (70-90%); b. 05/2017 catheter directed tPA, mechanical thrombectomy, and stenting of the SMA.  . Essential hypertension with goal blood pressure less than 140/90 01/19/2016  . Gastroesophageal reflux disease 01/19/2016  . GERD (gastroesophageal reflux disease)   . History of kidney stones   . Hyperlipidemia   . Hypertension   . Hypotension 07/29/2017  . Morbid obesity with BMI of 40.0-44.9, adult ([place])   . Occlusive mesenteric ischemia ([place]) 06/19/2017  . Personal history of other malignant neoplasm of skin 06/01/2016  . Primary osteoarthritis of both knees 06/13/2017  . Recurrent major depressive disorder ([place]) 01/19/2016  . SBO (small bowel obstruction) ([place]) 08/06/2017  . 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  Urinary tract infection 01/09/2014    Assessment/Plan: 2 Days Post-Op Procedure(s) (LRB): TOTAL KNEE ARTHROPLASTY (Right) Active Problems:   Status post total knee replacement using cement, right  Estimated body mass index is 41.34 kg/m as calculated from the following:   Height as of this encounter: 5\' 2"  (1.575 m).   Weight as of this encounter: 102.5 kg. Advance diet Up with therapy D/C IV fluids when tolerating po diet. Needs bowel movement Encouraged incentive spirometer. CBC pending for this morning. Discharge home with home health PT, most likely  tomorrow.   DVT Prophylaxis - Foot Pumps, TED hose and Eliquis Weight-Bearing as tolerated to right leg  Duanne Guess PA-C

## 2018-07-14 LAB — CBC
HCT: 25.8 % — ABNORMAL LOW (ref 35.0–47.0)
Hemoglobin: 8.8 g/dL — ABNORMAL LOW (ref 12.0–16.0)
MCH: 26.9 pg (ref 26.0–34.0)
MCHC: 34.2 g/dL (ref 32.0–36.0)
MCV: 78.7 fL — ABNORMAL LOW (ref 80.0–100.0)
Platelets: 287 10*3/uL (ref 150–440)
RBC: 3.28 MIL/uL — ABNORMAL LOW (ref 3.80–5.20)
RDW: 18.8 % — ABNORMAL HIGH (ref 11.5–14.5)
WBC: 10.2 10*3/uL (ref 3.6–11.0)

## 2018-07-14 LAB — BASIC METABOLIC PANEL
Anion gap: 10 (ref 5–15)
BUN: 14 mg/dL (ref 6–20)
CO2: 30 mmol/L (ref 22–32)
Calcium: 8.7 mg/dL — ABNORMAL LOW (ref 8.9–10.3)
Chloride: 95 mmol/L — ABNORMAL LOW (ref 98–111)
Creatinine, Ser: 0.81 mg/dL (ref 0.44–1.00)
GFR calc Af Amer: >60 mL/min (ref >60)
GFR calc non Af Amer: >60 mL/min (ref >60)
Glucose, Bld: 145 mg/dL — ABNORMAL HIGH (ref 70–99)
Potassium: 4.2 mmol/L (ref 3.5–5.1)
Sodium: 135 mmol/L (ref 135–145)

## 2018-07-14 NOTE — Plan of Care (Signed)
  Problem: Activity: Goal: Risk for activity intolerance will decrease Outcome: Progressing   Problem: Coping: Goal: Level of anxiety will decrease Outcome: Progressing   Problem: Elimination: Goal: Will not experience complications related to bowel motility Outcome: Progressing   Problem: Pain Managment: Goal: General experience of comfort will improve Outcome: Progressing   Problem: Skin Integrity: Goal: Risk for impaired skin integrity will decrease Outcome: Progressing   Problem: Activity: Goal: Ability to avoid complications of mobility impairment will improve Outcome: Progressing Goal: Range of joint motion will improve Outcome: Progressing   Problem: Pain Management: Goal: Pain level will decrease with appropriate interventions Outcome: Progressing   Problem: Skin Integrity: Goal: Will show signs of wound healing Outcome: Progressing

## 2018-07-14 NOTE — Progress Notes (Signed)
Subjective: 3 Days Post-Op Procedure(s) (LRB): TOTAL KNEE ARTHROPLASTY (Right) Patient reports pain as moderate improving from yesterday.  Patient with poor progress with physical therapy yesterday.  Therapy concerned about her blood clots, ultrasounds negative.  Patient is well, and has had no acute complaints or problems Negative for chest pain and shortness of breath Fever: Temp 99.0 last night. Gastrointestinal:Negative for nausea and vomiting, abd pain  Objective: Vital signs in last 24 hours: Temp:  [99 F (37.2 C)] 99 F (37.2 C) (09/21 2313) Pulse Rate:  [92-98] 92 (09/22 0849) Resp:  [17-18] 18 (09/22 0849) BP: (119-121)/(66-76) 121/76 (09/22 0849) SpO2:  [91 %-96 %] 96 % (09/22 0849)  Intake/Output from previous day: No intake or output data in the 24 hours ending 07/14/18 0850  Intake/Output this shift: No intake/output data recorded.  Labs: Recent Labs    07/12/18 0423  HGB 9.7*   Recent Labs    07/12/18 0423  WBC 10.8  RBC 3.76*  HCT 29.6*  PLT 310   Recent Labs    07/13/18 0422 07/14/18 0552  NA 137 135  K 4.5 4.2  CL 99 95*  CO2 30 30  BUN 13 14  CREATININE 0.67 0.81  GLUCOSE 123* 145*  CALCIUM 8.6* 8.7*   No results for input(s): LABPT, INR in the last 72 hours.   EXAM General - Patient is Alert, Appropriate and Oriented Extremity - ABD soft Sensation intact distally Intact pulses distally Dorsiflexion/Plantar flexion intact Incision: dressing C/D/I No cellulitis present  Calf is soft, negative [place] Dressing/Incision - clean, dry, no drainage Motor Function - intact, moving foot and toes well on exam.    Past Medical History:  Diagnosis Date  . Abdominal aortic atherosclerosis ([place])    a. 05/2017 CTA abd/pelvis: significant atherosclerotic dzs of the infrarenal abd Ao w/ some mural thrombus. No aneurysm or dissection.  . Abdominal pain   . Acute focal ischemia of small intestine (HCC)   . Acute right-sided low back pain with  right-sided sciatica 06/13/2017  . AKI (acute kidney injury) ([place]) 07/29/2017  . Baker's cyst of knee, right    a. 07/2016 U/S: 4.1 x 1.4 x 2.9 cystic structure in R poplitetal fossa.  . Bell's palsy   . Borderline diabetes   . Cancer ([place])    skin cancer on nose  . Chest pain    a. 08/2012 Lexiscan MV: EF 54%, non ischemia/infarct.  . CHF (congestive heart failure) ([place])   . Chronic idiopathic constipation   . Chronic mesenteric ischemia ([place])   . Diastolic dysfunction    a. 05/2017 Echo: Ef 60-65%, no rwma, Gr1 DD, no source of cardiac emboli.  . 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 05/2017 CTA Abd/pelvis: apparent thrombus or embolus in prox SMA (70-90%); b. 05/2017 catheter directed tPA, mechanical thrombectomy, and stenting of the SMA.  . Essential hypertension with goal blood pressure less than 140/90 01/19/2016  . Gastroesophageal reflux disease 01/19/2016  . GERD (gastroesophageal reflux disease)   . History of kidney stones   . Hyperlipidemia   . Hypertension   . Hypotension 07/29/2017  . Morbid obesity with BMI of 40.0-44.9, adult ([place])   . Occlusive mesenteric ischemia ([place]) 06/19/2017  . Personal history of other malignant neoplasm of skin 06/01/2016  . Primary osteoarthritis of both knees 06/13/2017  . Recurrent major depressive disorder ([place]) 01/19/2016  . SBO (small bowel obstruction) ([place]) 08/06/2017  . 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 Urinary  tract infection 01/09/2014    Assessment/Plan: 3 Days Post-Op Procedure(s) (LRB): TOTAL KNEE ARTHROPLASTY (Right) Active Problems:   Status post total knee replacement using cement, right  Estimated body mass index is 41.34 kg/m as calculated from the following:   Height as of this encounter: 5\' 2"  (1.575 m).   Weight as of this encounter: 102.5 kg. Advance diet Up with therapy D/C IV fluids when tolerating po diet. Needs bowel movement Encouraged incentive spirometer. CBC pending for this  morning. Care management to assist with discharge.  PT recommended skilled nursing facility but patient refusing.  Patient understands she needs to make progress of physical therapy today.   DVT Prophylaxis - Foot Pumps, TED hose and Eliquis Weight-Bearing as tolerated to right leg  Duanne Guess PA-C

## 2018-07-14 NOTE — Progress Notes (Signed)
During assessment this RN found the patient in bed with pillow under right knee. Educated patient about the need to use towel roll to keep heel elevated, and that pillow under the knee is contraindicated after surgery. Patient hesitant to remove pillow from knee but did agree to remove. Will continue to monitor.

## 2018-07-14 NOTE — Progress Notes (Signed)
Physical Therapy Treatment Patient Details Name: Carla [name] MRN: [id number] DOB: [birth year] Today's Date: 07/14/2018    History of Present Illness Pt is a 57 y.o female presenting S/P TKR. PMH significant for cancer, hyperlipidemia, hypertension, bell's palsy, embolus of superior mesenteric artery, SBO, and OA.    PT Comments    Pt received pain medication prior to session.  Reports pain 6/10 and begins crying soon after entering room.  Participated in exercises as described below.  KI donned.  To edge of bed with min a x 1.  Pt stood with poor safety despite verbal cues and education by leaning on and pulling up on walker.  Once standing, she was able to ambulate 25' x 2 with walker and slow but generally steady gait.  Encouragement and distraction though out session with good results.  She did however begin crying again at end of session due to a recent death of her mother and other family members deaths/injuries.  Encouragement given.  Pt stated she does not have stairs to enter her home.  Unable to tolerate training to date.   Follow Up Recommendations  SNF - pt is refusing SNF stating her family will be able to care for her.  While SNF remains appropriate for her due to limited mobility, pain and questionable HEP compliance upon discharge.  HHPT will be necessary if she continues to decline SNF.     Equipment Recommendations  Rolling walker with 5" wheels;3in1 (PT)    Recommendations for Other Services       Precautions / Restrictions Precautions Precautions: Fall Required Braces or Orthoses: Knee Immobilizer - Right Knee Immobilizer - Right: Discontinue once straight leg raise with < 10 degree lag Restrictions Weight Bearing Restrictions: Yes RLE Weight Bearing: Weight bearing as tolerated    Mobility  Bed Mobility Overal bed mobility: Needs Assistance Bed Mobility: Supine to Sit;Sit to Supine Rolling: Min guard   Supine to sit: Min assist Sit to supine: Min assist    General bed mobility comments: for RLE  Transfers Overall transfer level: Needs assistance Equipment used: Rolling walker (2 wheeled) Transfers: Sit to/from Stand Sit to Stand: Min assist         General transfer comment: continues to lean and pull up on walker despite education for proper techniques  Ambulation/Gait Ambulation/Gait assistance: Min guard Gait Distance (Feet): 25 Feet Assistive device: Rolling walker (2 wheeled) Gait Pattern/deviations: Step-to pattern   Gait velocity interpretation: <1.8 ft/sec, indicate of risk for recurrent falls General Gait Details: 25' x 2 - requried seated rest before return to room.   Stairs             Wheelchair Mobility    Modified Rankin (Stroke Patients Only)       Balance Overall balance assessment: Needs assistance Sitting-balance support: Bilateral upper extremity supported;Feet supported Sitting balance-[name]: Good     Standing balance support: Bilateral upper extremity supported Standing balance-[name]: Fair                              Cognition Arousal/Alertness: Awake/alert Behavior During Therapy: Anxious;WFL for tasks assessed/performed Overall Cognitive Status: Within Functional Limits for tasks assessed                                        Exercises Total Joint Exercises Ankle Circles/Pumps: AROM;Both;Supine;10 reps  Quad Sets: Strengthening;Both;10 reps;Supine Heel Slides: AAROM;Right;Supine;10 reps Hip ABduction/ADduction: AAROM;Right;10 reps;Supine Straight Leg Raises: AAROM;Right;Supine;10 reps Knee Flexion: AROM;Right;10 reps;Seated;5 reps Goniometric ROM: 6-72 self limits    General Comments        Pertinent Vitals/Pain Pain Assessment: 0-10 Pain Score: 6  Pain Location: R knee Pain Descriptors / Indicators: Aching;Grimacing;Constant;Moaning;Throbbing;Tightness;Crying Pain Intervention(s): Limited activity within patient's  tolerance;Monitored during session;Premedicated before session;Ice applied    Home Living                      Prior Function            PT Goals (current goals can now be found in the care plan section) Progress towards PT goals: Progressing toward goals    Frequency    BID      PT Plan Current plan remains appropriate    Co-evaluation              AM-PAC PT "6 Clicks" Daily Activity  Outcome Measure  Difficulty turning over in bed (including adjusting bedclothes, sheets and blankets)?: A Little Difficulty moving from lying on back to sitting on the side of the bed? : A Little Difficulty sitting down on and standing up from a chair with arms (e.g., wheelchair, bedside commode, etc,.)?: Unable Help needed moving to and from a bed to chair (including a wheelchair)?: A Little Help needed walking in hospital room?: A Little Help needed climbing 3-5 steps with a railing? : A Lot 6 Click Score: 15    End of Session Equipment Utilized During Treatment: Gait belt;Right knee immobilizer Activity Tolerance: Patient limited by pain Patient left: in bed;with call bell/phone within reach;with family/visitor present   Pain - Right/Left: Right Pain - part of body: Knee     Time: 0921-1000 PT Time Calculation (min) (ACUTE ONLY): 39 min  Charges:  $Gait Training: 8-22 mins $Therapeutic Exercise: 8-22 mins $Therapeutic Activity: 8-22 mins                     Chesley Noon, PTA 07/14/18, 10:25 AM

## 2018-07-15 MED ORDER — DOCUSATE SODIUM 100 MG PO CAPS: 100.0000 mg | ORAL_CAPSULE | Freq: Two times a day (BID) | ORAL | 0 refills | Status: DC

## 2018-07-15 NOTE — Progress Notes (Signed)
Physical Therapy Treatment Patient Details Name: Carla [name] MRN: [id number] DOB: September 30, 1961 Today's Date: 07/15/2018    History of Present Illness Pt is a 57 y.o female presenting S/P TKR. PMH significant for cancer, hyperlipidemia, hypertension, bell's palsy, embolus of superior mesenteric artery, SBO, and OA.    PT Comments    Pt presents with deficits in strength, transfers, mobility, gait, balance, R knee ROM, and activity tolerance.  Pt tearful and/or lightly crying throughout the session and is highly self-limiting with all activities including therex, mobility, and gait due to pain.  Pt continues to require assistance with bed mobility tasks to manage her RLE in and out of bed.  Pt shows poor carryover regarding proper sequencing during transfers and required extensive verbal cues for UE placement and to ensure R foot forward prior to sitting secondary to [place] donned.  Pt ambulated a max of 25' with a RW with very slow, antalgic cadence but was generally steady without LOB.  Pt is at an elevated risk for functional decline and R knee ROM deficits upon discharge home secondary to highly self-limited behavior but is refusing SNF placement.  Pt will benefit from PT services in a SNF setting upon discharge to safely address above deficits for decreased caregiver assistance and eventual return to PLOF.     Follow Up Recommendations  SNF     Equipment Recommendations  Rolling walker with 5" wheels;3in1 (PT)    Recommendations for Other Services       Precautions / Restrictions Precautions Precautions: Fall Required Braces or Orthoses: Knee Immobilizer - Right Knee Immobilizer - Right: Discontinue once straight leg raise with < 10 degree lag Restrictions Weight Bearing Restrictions: Yes RLE Weight Bearing: Weight bearing as tolerated    Mobility  Bed Mobility Overal bed mobility: Needs Assistance Bed Mobility: Supine to Sit;Sit to Supine     Supine to sit: Min assist Sit to  supine: Min assist   General bed mobility comments: Min A for RLE in and out of bed  Transfers Overall transfer level: Needs assistance Equipment used: Rolling walker (2 wheeled) Transfers: Sit to/from Stand Sit to Stand: Min assist         General transfer comment: Mod verbal cues for proper sequencing with pt impulsive with poor carryover  Ambulation/Gait Ambulation/Gait assistance: Min guard Gait Distance (Feet): 25 Feet Assistive device: Rolling walker (2 wheeled) Gait Pattern/deviations: Step-to pattern Gait velocity: Decreased   General Gait Details: Pt encouraged to amb further but declined secondary to pain and fatigue   Stairs             Wheelchair Mobility    Modified Rankin (Stroke Patients Only)       Balance Overall balance assessment: Needs assistance Sitting-balance support: Bilateral upper extremity supported;Feet supported Sitting balance-[name]: Good     Standing balance support: Bilateral upper extremity supported Standing balance-[name]: Fair                              Cognition Arousal/Alertness: Awake/alert Behavior During Therapy: WFL for tasks assessed/performed Overall Cognitive Status: Within Functional Limits for tasks assessed                                        Exercises Total Joint Exercises Ankle Circles/Pumps: AROM;Both;Supine;10 reps Quad Sets: Strengthening;Both;10 reps;AROM Heel Slides: Supine;10 reps;AROM;AAROM;Both Hip ABduction/ADduction:  AAROM;Right;5 reps Straight Leg Raises: AAROM;Right;Supine;10 reps Long Arc Quad: AROM;Both;10 reps;5 reps Knee Flexion: AROM;Both;5 reps;10 reps Goniometric ROM: R knee AROM: 5-60, highly self-limiting Marching in Standing: AROM;Both;5 reps Other Exercises Other Exercises: Physiological benefits of activity and R knee motion education provided    General Comments        Pertinent Vitals/Pain Pain Assessment: No/denies  pain Pain Score: 8  Pain Location: R knee Pain Descriptors / Indicators: Aching;Grimacing;Constant;Moaning;Throbbing;Crying;Operative site guarding Pain Intervention(s): Premedicated before session;Monitored during session;Limited activity within patient's tolerance    Home Living                      Prior Function            PT Goals (current goals can now be found in the care plan section) Progress towards PT goals: Not progressing toward goals - comment(Limited by R knee pain)    Frequency    BID      PT Plan Current plan remains appropriate    Co-evaluation              AM-PAC PT "6 Clicks" Daily Activity  Outcome Measure                   End of Session Equipment Utilized During Treatment: Gait belt;Right knee immobilizer Activity Tolerance: Patient limited by pain Patient left: in bed;with call bell/phone within reach;with family/visitor present Nurse Communication: Mobility status PT Visit Diagnosis: Unsteadiness on feet (R26.81);Muscle weakness (generalized) (M62.81);Difficulty in walking, not elsewhere classified (R26.2);Pain Pain - Right/Left: Right Pain - part of body: Knee     Time: 7510-2585 PT Time Calculation (min) (ACUTE ONLY): 27 min  Charges:  $Gait Training: 8-22 mins $Therapeutic Exercise: 8-22 mins                     D. Scott Advay [place] PT, DPT 07/15/18, 10:28 AM

## 2018-07-15 NOTE — Care Management Note (Signed)
Case Management Note  Patient Details  Name: GIORGIA WAHLER MRN: [id number] Date of Birth: [birth year]  Subjective/Objective:  Met with patient. She lives at home with her spouse. PT recommending SNF but she has refused. She is very tearful. She will need a walker and bsc. Ordered from [place] with [facility]. Provided a list of home care agencies. Referral to [facility] for HHPT. Discharging on Eliquis as anticoagulant. 30 free Eliquis coupon given with instruction on how to use it. Verified address and phone number as below. [facility] updated.                   Action/Plan: Address: [address],  [place] ,[place] [address]  Expected Discharge Date:  07/15/18               Expected Discharge Plan:  [facility]  In-House Referral:     Discharge planning Services  CM Consult  Post Acute Care Choice:  Durable Medical Equipment, Home Health Choice offered to:  Patient  DME Arranged:  Walker rolling, Bedside commode DME Agency:  [place]:  PT [place]:  [facility] ([facility])  Status of Service:  Completed, signed off  If discussed at [employer] of Stay Meetings, dates discussed:    Additional Comments:  Jolly Mango, RN 07/15/2018, 9:31 AM

## 2018-07-15 NOTE — Progress Notes (Signed)
Discharge instructions and medication regimen reviewed at bedside with patient. Pt. verbalizes understanding of instructions and medication regimen.Prescriptions given to pt. IV removed. No questions or complaints from pt. Pt dressed awaiting ride home.

## 2018-07-15 NOTE — Care Management Important Message (Signed)
Important Message  Patient Details  Name: Carla [name] MRN: [id number] Date of Birth: 02-26-1961   Medicare Important Message Given:  Yes    [name] 07/15/2018, 10:45 AM

## 2018-07-15 NOTE — Progress Notes (Addendum)
Subjective: 4 Days Post-Op Procedure(s) (LRB): TOTAL KNEE ARTHROPLASTY (Right) Patient reports pain as moderate  Patient is well, and has had no acute complaints or problems Negative for chest pain and shortness of breath Fever: Temp 99.0 last night. Gastrointestinal:Negative for nausea and vomiting, abd pain.  Patient states she is passing a lot of gas.  Objective: Vital signs in last 24 hours: Temp:  [98.3 F (36.8 C)-98.6 F (37 C)] 98.6 F (37 C) (09/22 2314) Pulse Rate:  [88-92] 89 (09/22 2314) Resp:  [18] 18 (09/22 2314) BP: (109-125)/(59-84) 123/84 (09/22 2314) SpO2:  [92 %-96 %] 93 % (09/22 2314)  Intake/Output from previous day:  Intake/Output Summary (Last 24 hours) at 07/15/2018 0747 Last data filed at 07/14/2018 1008 Gross per 24 hour  Intake 240 ml  Output -  Net 240 ml    Intake/Output this shift: No intake/output data recorded.  Labs: Recent Labs    07/14/18 0552  HGB 8.8*   Recent Labs    07/14/18 0552  WBC 10.2  RBC 3.28*  HCT 25.8*  PLT 287   Recent Labs    07/13/18 0422 07/14/18 0552  NA 137 135  K 4.5 4.2  CL 99 95*  CO2 30 30  BUN 13 14  CREATININE 0.67 0.81  GLUCOSE 123* 145*  CALCIUM 8.6* 8.7*   No results for input(s): LABPT, INR in the last 72 hours.   EXAM General - Patient is Alert, Appropriate and Oriented  Abdomen -soft nontender, nondistended.  Normal bowel sounds Extremity - ABD soft Sensation intact distally Intact pulses distally Dorsiflexion/Plantar flexion intact Incision: dressing C/D/I No cellulitis present  Calf is soft, negative [place] Dressing/Incision - clean, dry, scant drainage Motor Function - intact, moving foot and toes well on exam.    Past Medical History:  Diagnosis Date  . Abdominal aortic atherosclerosis ([place])    a. 05/2017 CTA abd/pelvis: significant atherosclerotic dzs of the infrarenal abd Ao w/ some mural thrombus. No aneurysm or dissection.  . Abdominal pain   . Acute focal ischemia  of small intestine ([facility])   . Acute right-sided low back pain with right-sided sciatica 06/13/2017  . AKI (acute kidney injury) ([place]) 07/29/2017  . Baker's cyst of knee, right    a. 07/2016 U/S: 4.1 x 1.4 x 2.9 cystic structure in R poplitetal fossa.  . Bell's palsy   . Borderline diabetes   . Cancer ([place])    skin cancer on nose  . Chest pain    a. 08/2012 Lexiscan MV: EF 54%, non ischemia/infarct.  . CHF (congestive heart failure) ([place])   . Chronic idiopathic constipation   . Chronic mesenteric ischemia ([place])   . Diastolic dysfunction    a. 05/2017 Echo: Ef 60-65%, no rwma, Gr1 DD, no source of cardiac emboli.  . 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[place])    a. 05/2017 CTA Abd/pelvis: apparent thrombus or embolus in prox SMA (70-90%); b. 05/2017 catheter directed tPA, mechanical thrombectomy, and stenting of the SMA.  . Essential hypertension with goal blood pressure less than 140/90 01/19/2016  . Gastroesophageal reflux disease 01/19/2016  . GERD (gastroesophageal reflux disease)   . History of kidney stones   . Hyperlipidemia   . Hypertension   . Hypotension 07/29/2017  . Morbid obesity with BMI of 40.0-44.9, adult ([place])   . Occlusive mesenteric ischemia ([place]) 06/19/2017  . Personal history of other malignant neoplasm of skin 06/01/2016  . Primary osteoarthritis of both knees 06/13/2017  . Recurrent major depressive disorder (  [place]) 01/19/2016  . SBO (small bowel obstruction) ([place]) 08/06/2017  . 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 Urinary tract infection 01/09/2014    Assessment/Plan: 4 Days Post-Op Procedure(s) (LRB): TOTAL KNEE ARTHROPLASTY (Right) Active Problems:   Status post total knee replacement using cement, right  Estimated body mass index is 41.34 kg/m as calculated from the following:   Height as of this encounter: 5\' 2"  (1.575 m).   Weight as of this encounter: 102.5 kg. [facility] diet Up with therapy D/C IV fluids when tolerating po diet. Needs bowel  movement, try suppository today.  Patient without nausea vomiting, abdominal pain.  She is passing a lot of gas Encouraged incentive spirometer. Care management to assist with discharge.  PT recommended skilled nursing facility but patient refusing. Discharge home with [facility] today   DVT Prophylaxis - Foot Pumps, TED hose and Eliquis Weight-Bearing as tolerated to right leg  Duanne Guess PA-C

## 2018-07-15 NOTE — Discharge Summary (Signed)
Physician Discharge Summary  Patient ID: Carla [name] MRN: [id number] DOB/AGE: March 27, 1961 57 y.o.  Admit date: 07/11/2018 Discharge date: 07/15/2018  Admission Diagnoses:  PRIMARY OSTEOARTHRITIS OF RIGHT KNEE   Discharge Diagnoses: Patient Active Problem List   Diagnosis Date Noted  . Status post total knee replacement using cement, right 07/11/2018  . PAF (paroxysmal atrial fibrillation) ([place]) 06/15/2018  . Accelerated hypertension 04/08/2018  . Acute encephalopathy 04/08/2018  . Adjustment disorder with anxiety 08/15/2017  . Chronic idiopathic constipation   . Protein-calorie malnutrition, severe 08/08/2017  . SBO (small bowel obstruction) ([place]) 08/06/2017  . Chronic mesenteric ischemia ([place])   . AKI (acute kidney injury) ([place]) 07/29/2017  . Hypotension 07/29/2017  . 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[place])   . Abdominal pain   . Occlusive mesenteric ischemia ([place]) 06/19/2017  . 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 Acute focal ischemia of small intestine (HCC)   . Acute right-sided low back pain with right-sided sciatica 06/13/2017  . Primary osteoarthritis of both knees 06/13/2017  . Pain syndrome, chronic 09/13/2016  . Morbid obesity with BMI of 40.0-44.9, adult ([place]) 07/16/2016  . Personal history of other malignant neoplasm of skin 06/01/2016  . Chronic pain of both knees 01/19/2016  . HTN (hypertension) 01/19/2016  . Gastroesophageal reflux disease 01/19/2016  . Hyperlipidemia 01/19/2016  . Prediabetes 01/19/2016  . Recurrent major depressive disorder ([place]) 01/19/2016  . Urinary tract infection 01/09/2014  . Chronic pelvic pain in female 11/14/2013  . Mixed stress and urge urinary incontinence 11/14/2013  . Chest pain 08/06/2012    Past Medical History:  Diagnosis Date  . Abdominal aortic atherosclerosis ([place])    a. 05/2017 CTA abd/pelvis: significant atherosclerotic dzs of the infrarenal abd Ao w/ some mural thrombus. No aneurysm or dissection.  .  Abdominal pain   . Acute focal ischemia of small intestine (HCC)   . Acute right-sided low back pain with right-sided sciatica 06/13/2017  . AKI (acute kidney injury) ([place]) 07/29/2017  . Baker's cyst of knee, right    a. 07/2016 U/S: 4.1 x 1.4 x 2.9 cystic structure in R poplitetal fossa.  . Bell's palsy   . Borderline diabetes   . Cancer ([place])    skin cancer on nose  . Chest pain    a. 08/2012 Lexiscan MV: EF 54%, non ischemia/infarct.  . CHF (congestive heart failure) ([place])   . Chronic idiopathic constipation   . Chronic mesenteric ischemia ([place])   . Diastolic dysfunction    a. 05/2017 Echo: Ef 60-65%, no rwma, Gr1 DD, no source of cardiac emboli.  . 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[place])    a. 05/2017 CTA Abd/pelvis: apparent thrombus or embolus in prox SMA (70-90%); b. 05/2017 catheter directed tPA, mechanical thrombectomy, and stenting of the SMA.  . Essential hypertension with goal blood pressure less than 140/90 01/19/2016  . Gastroesophageal reflux disease 01/19/2016  . GERD (gastroesophageal reflux disease)   . History of kidney stones   . Hyperlipidemia   . Hypertension   . Hypotension 07/29/2017  . Morbid obesity with BMI of 40.0-44.9, adult ([place])   . Occlusive mesenteric ischemia ([place]) 06/19/2017  . Personal history of other malignant neoplasm of skin 06/01/2016  . Primary osteoarthritis of both knees 06/13/2017  . Recurrent major depressive disorder ([place]) 01/19/2016  . SBO (small bowel obstruction) ([place]) 08/06/2017  . 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 Urinary tract infection 01/09/2014     Transfusion: none   Consultants (if any):  Discharged Condition: Improved  Hospital Course: Carla DALL is an 57 y.o. female who was admitted 07/11/2018 with a diagnosis of right knee osteoarthritis and went to the operating room on 07/11/2018 and underwent the above named procedures.    Surgeries: Procedure(s): TOTAL KNEE ARTHROPLASTY on 07/11/2018 Patient tolerated  the surgery well. Taken to PACU where she was stabilized and then transferred to the orthopedic floor.  Started on Eliquis 5 mg twice daily.  TED hose.  Foot pumps applied bilaterally at 80 mm. Heels elevated on bed with rolled towels. No evidence of DVT. Negative [place]. Physical therapy started on day #1 for gait training and transfer. OT started day #1 for ADL and assisted devices.  Patient's foley was d/c on day #1. Patient's IV was d/c on day #2.  On postop day 2 patient was making slow progress with physical therapy.  Pain slightly improved.  Vital signs stable.  Physical therapy concerned about pain and warmth in the right calf, ultrasound was ordered and negative for DVT.  Patient without signs of compartment syndrome in the right lower leg.  On postop day 3 and 4 patient able to get up and ambulate with physical therapy but making slow progress.  PT recommended patient going to SNF but patient absolutely refusing skilled nursing facility and states she would only go home.  She does have significant help at home with multiple family members. On post op day #4 patient was stable and ready for discharge to home with home health physical therapy.  Implants: Right TKA using all-cemented [employer] Vanguard system with a 65 mm PCR femur, a 71 mm tibial tray with a 14 mm AS E-poly insert, and a 34 x 8.5 mm all-poly 3-pegged domed patella.  She was given perioperative antibiotics:  Anti-infectives (From admission, onward)   Start     Dose/Rate Route Frequency Ordered Stop   07/11/18 1330  ceFAZolin (ANCEF) 3 g in dextrose 5 % 50 mL IVPB     3 g 100 mL/hr over 30 Minutes Intravenous Every 6 hours 07/11/18 1147 07/12/18 0130   07/11/18 0616  ceFAZolin (ANCEF) 2-4 GM/100ML-% IVPB    Note to Pharmacy:  Josephina Shih   : cabinet override      07/11/18 0616 07/11/18 1829   07/10/18 2245  ceFAZolin (ANCEF) IVPB 2g/100 mL premix     2 g 200 mL/hr over 30 Minutes Intravenous  Once 07/10/18 2237 07/11/18  0738    .  She was given sequential compression devices, early ambulation, and Eliquis, TED hose for DVT prophylaxis.  She benefited maximally from the hospital stay and there were no complications.    Recent vital signs:  Vitals:   07/14/18 2146 07/14/18 2314  BP: 125/71 123/84  Pulse:  89  Resp:  18  Temp:  98.6 F (37 C)  SpO2:  93%    Recent laboratory studies:  Lab Results  Component Value Date   HGB 8.8 (L) 07/14/2018   HGB 9.7 (L) 07/12/2018   HGB 10.9 (L) 07/08/2018   Lab Results  Component Value Date   WBC 10.2 07/14/2018   PLT 287 07/14/2018   Lab Results  Component Value Date   INR 1.04 07/08/2018   Lab Results  Component Value Date   NA 135 07/14/2018   K 4.2 07/14/2018   CL 95 (L) 07/14/2018   CO2 30 07/14/2018   BUN 14 07/14/2018   CREATININE 0.81 07/14/2018   GLUCOSE 145 (H) 07/14/2018    Discharge  Medications:   Allergies as of 07/15/2018   No Known Allergies     Medication List    TAKE these medications   amLODipine 5 MG tablet Commonly known as:  NORVASC Take 1 tablet (5 mg total) by mouth daily.   apixaban 5 MG Tabs tablet Commonly known as:  ELIQUIS Take 5 mg by mouth 2 (two) times daily.   atorvastatin 80 MG tablet Commonly known as:  LIPITOR Take 80 mg by mouth at bedtime.   CYMBALTA 30 MG capsule Generic drug:  DULoxetine Take 30 mg by mouth daily.   docusate sodium 100 MG capsule Commonly known as:  COLACE Take 1 capsule (100 mg total) by mouth 2 (two) times daily.   ENSURE Take 237 mLs by mouth 2 (two) times daily.   ferrous sulfate 325 (65 FE) MG tablet Take 325 mg by mouth daily.   metoprolol tartrate 50 MG tablet Commonly known as:  LOPRESSOR Take 1 tablet (50 mg total) by mouth 2 (two) times daily.   omeprazole 20 MG capsule Commonly known as:  PRILOSEC Take 20 mg by mouth 2 (two) times daily before a meal.   oxyCODONE 5 MG immediate release tablet Commonly known as:  Oxy IR/ROXICODONE Take 1-2  tablets (5-10 mg total) by mouth every 4 (four) hours as needed for moderate pain.   polyethylene glycol packet Commonly known as:  MIRALAX / GLYCOLAX Take 17 g by mouth 2 (two) times daily as needed.   traMADol 50 MG tablet Commonly known as:  ULTRAM Take 1 tablet (50 mg total) by mouth every 6 (six) hours as needed for moderate pain. What changed:    when to take this  reasons to take this            Durable Medical Equipment  (From admission, onward)         Start     Ordered   07/11/18 1147  DME Walker rolling  Once    Question:  Patient needs a walker to treat with the following condition  Answer:  Status post total knee replacement using cement, right   07/11/18 1147   07/11/18 1147  DME Bedside commode  Once    Question:  Patient needs a bedside commode to treat with the following condition  Answer:  Status post total knee replacement using cement, right   07/11/18 1147   07/11/18 1147  DME 3 n 1  Once     07/11/18 1147          Diagnostic Studies: Dg Chest 2 View  Result Date: 07/09/2018 CLINICAL DATA:  Preop chest EXAM: CHEST - 2 VIEW COMPARISON:  06/14/2018 FINDINGS: Heart and mediastinal contours are within normal limits. No focal opacities or effusions. No acute bony abnormality. IMPRESSION: No active cardiopulmonary disease. Electronically Signed   By: Rolm Baptise M.D.   On: 07/09/2018 08:56   US Venous Img Lower Unilateral Right  Result Date: 07/13/2018 CLINICAL DATA:  Pain and edema of right lower extremity. Two days post right knee arthroplasty. EXAM: RIGHT LOWER EXTREMITY VENOUS DOPPLER ULTRASOUND TECHNIQUE: Gray-scale sonography with graded compression, as well as color Doppler and duplex ultrasound were performed to evaluate the lower extremity deep venous systems from the level of the common femoral vein and including the common femoral, femoral, profunda femoral, popliteal and calf veins including the posterior tibial, peroneal and gastrocnemius  veins when visible. The superficial great saphenous vein was also interrogated. Spectral Doppler was utilized to evaluate flow at rest  and with distal augmentation maneuvers in the common femoral, femoral and popliteal veins. COMPARISON:  None. FINDINGS: Contralateral Common Femoral Vein: Respiratory phasicity is normal and symmetric with the symptomatic side. No evidence of thrombus. Normal compressibility. Common Femoral Vein: No evidence of thrombus. Normal compressibility, respiratory phasicity and response to augmentation. Saphenofemoral Junction: No evidence of thrombus. Normal compressibility and flow on color Doppler imaging. Profunda Femoral Vein: No evidence of thrombus. Normal compressibility and flow on color Doppler imaging. Femoral Vein: No evidence of thrombus. Normal compressibility, respiratory phasicity and response to augmentation. Popliteal Vein: No evidence of thrombus. Normal compressibility, respiratory phasicity and response to augmentation. Calf Veins: No evidence of thrombus. Normal compressibility and flow on color Doppler imaging. Superficial Great Saphenous Vein: No evidence of thrombus. Normal compressibility. Venous Reflux:  None. Other Findings: No evidence of superficial thrombophlebitis or abnormal fluid collection. IMPRESSION: No evidence of right lower extremity deep venous thrombosis. Electronically Signed   By: Aletta Edouard M.D.   On: 07/13/2018 16:56   Dg Knee Right Port  Result Date: 07/11/2018 CLINICAL DATA:  Right knee replacement. EXAM: PORTABLE RIGHT KNEE - 1-2 VIEW COMPARISON:  05/05/2017. FINDINGS: Total right knee replacement. Hardware intact. Anatomic alignment. No acute bony abnormality. IMPRESSION: Total right knee replacement with anatomic alignment. Electronically Signed   By: Marcello Moores  Register   On: 07/11/2018 11:01   Ct Angio Abd/pel W/ And/or W/o  Result Date: 06/19/2018 CLINICAL DATA:  C/o severe abdominal hernia, pt states swollen abdomen x65yr is  hard to the touch, diffuse abdominal pain, states large portion of colon removed 37yr ago, states hospitalized 2wks ago for heart. Hx COPD, AFIB EXAM: CTA ABDOMEN AND PELVIS WITH CONTRAST TECHNIQUE: Multidetector CT imaging of the abdomen and pelvis was performed using the standard protocol during bolus administration of intravenous contrast. Multiplanar reconstructed images and MIPs were obtained and reviewed to evaluate the vascular anatomy. CONTRAST:  143mL ISOVUE-370 IOPAMIDOL (ISOVUE-370) INJECTION 76% COMPARISON:  01/24/2018 and previous FINDINGS: VASCULAR Aorta: Moderate partially calcified atheromatous plaque particularly in the infrarenal segment. No aneurysm, dissection, or significant stenosis. Celiac: Patent without evidence of aneurysm, dissection, vasculitis or significant stenosis. 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 The native SMA is reconstituted just beyond the stent via visceral collateral vessels. Distally, there has been interval thrombotic occlusion of a right ileocolic branch since prior study. Renals: Duplicated on the right, inferior dominant, widely patent. Duplicated on the left, superior dominant, both patent. IMA: Short-segment origin stenosis probably related aortic wall plaque, patent distally. Inflow: Partially calcified plaque in bilateral common iliac arteries. No aneurysm, dissection, or stenosis. Proximal Outflow: Bilateral common femoral and visualized portions of the superficial and profunda femoral arteries are patent without evidence of aneurysm, dissection, vasculitis or significant stenosis. Veins: Patent hepatic veins, portal vein, superior mesenteric vein, IMV, splenic vein, bilateral renal veins. Incomplete opacification of the iliac venous system probably related to scan timing. No definite venous pathology. Review of the MIP images confirms the above findings. NON-VASCULAR Lower chest: No acute abnormality. Hepatobiliary: No focal liver  abnormality is seen. Status post cholecystectomy. No biliary dilatation. Pancreas: Unremarkable. No pancreatic ductal dilatation or surrounding inflammatory changes. Spleen: Normal in size without focal abnormality. Adrenals/Urinary Tract: Nodular left adrenal hyperplasia stable. Right adrenal normal. No renal mass. No hydronephrosis. Urinary bladder incompletely distended. Stomach/Bowel: Stomach is incompletely distended. Small bowel is nondilated. Anastomotic staple lines in the distal small bowel. Multiple loops of small bowel and cecum protrude into a broad ventral hernia without evidence  of obstruction or strangulation. The remainder of the colon is nondilated, unremarkable. No definite bowel wall thickening nor adjacent inflammatory/edematous change. Lymphatic: No abdominal or pelvic adenopathy. Reproductive: Status post hysterectomy. No adnexal masses. Other: No ascites.  No free air. Musculoskeletal: Broad ventral hernia involving distal small bowel and proximal colon without obstruction or strangulation. Regional bones unremarkable. No fracture or worrisome bone lesion. IMPRESSION: VASCULAR 1. 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 2. Interval distal thrombotic/embolic occlusion involving a right ileocolic arterial branch of the SMA. 3. Patent celiac axis and IMA provide collateral circulation to the distal SMA distribution. 4. Aortoiliac atherosclerosis (ICD10-170.0) without aneurysm or stenosis. NON-VASCULAR 1. Broad ventral hernia involving distal small bowel and proximal colon, without evidence of obstruction or strangulation. 2. No focal bowel wall thickening or other CT suggestion of mesenteric ischemia. Electronically Signed   By: Lucrezia Europe M.D.   On: 06/19/2018 12:51    Disposition:     Follow-up Information    Lattie Corns, PA-C Follow up in 14 day(s).   Specialty:  Physician Assistant Why:  Electa Sniff information: [place] [address] [phone]            Signed: Feliberto Gottron 07/15/2018, 7:56 AM

## 2018-07-17 ENCOUNTER — Ambulatory Visit: Payer: Medicaid Other

## 2018-07-17 ENCOUNTER — Other Ambulatory Visit: Payer: Self-pay | Admitting: Nephrology

## 2018-07-17 DIAGNOSIS — Z96651 Presence of right artificial knee joint: Secondary | ICD-10-CM

## 2018-07-18 ENCOUNTER — Telehealth: Payer: Self-pay | Admitting: Pharmacy Technician

## 2018-07-18 ENCOUNTER — Ambulatory Visit
Admission: RE | Admit: 2018-07-18 | Discharge: 2018-07-18 | Disposition: A | Discharge status: Home / Self Care | Payer: Medicare Other | Source: Ambulatory Visit | Attending: Nephrology | Admitting: Nephrology

## 2018-07-18 DIAGNOSIS — Z96651 Presence of right artificial knee joint: Secondary | ICD-10-CM | POA: Diagnosis present

## 2018-07-18 NOTE — Telephone Encounter (Signed)
Patient has active Medicare A, B & D.  No longer meets [facility]'s eligibility criteria.  Pateint acknowledged that she understood that she is no longer able to obtain medication assistance from [facility].  [facility]

## 2018-07-22 ENCOUNTER — Telehealth: Payer: Self-pay | Admitting: Licensed Clinical Social Worker

## 2018-07-22 NOTE — Telephone Encounter (Signed)
Clinical Education officer, museum (CSW) contacted patient via telephone. Per patient she is still in a lot of pain and is going to Dr. Nicholaus Bloom office on Friday for her follow up appointment. Per patient she is working with home health PT as much as she can. CSW provided emotional support. Patient reported that she is feeling sad however she is not having thoughts of hurting herself. CSW provided patient with outpatient counseling resources including [facility] and [facility]. Per patient she knows where [facility] is located and will likely follow up there. Patient reported no other needs or concerns.   [employer], LCSW [phone]

## 2018-09-06 DIAGNOSIS — M47816 Spondylosis without myelopathy or radiculopathy, lumbar region: Secondary | ICD-10-CM | POA: Insufficient documentation

## 2018-09-07 IMAGING — CR DG CHEST 2V
1 series · 2 of 2 positions shown · non-contrast
Comparison: 08/06/2016

CLINICAL DATA: Inhalational exposure, cough

EXAM:
CHEST  2 VIEW

[Series 1: dg chest 2 view · 0.14mm/px · 2 of 2 slices shown]
[im 1/2]
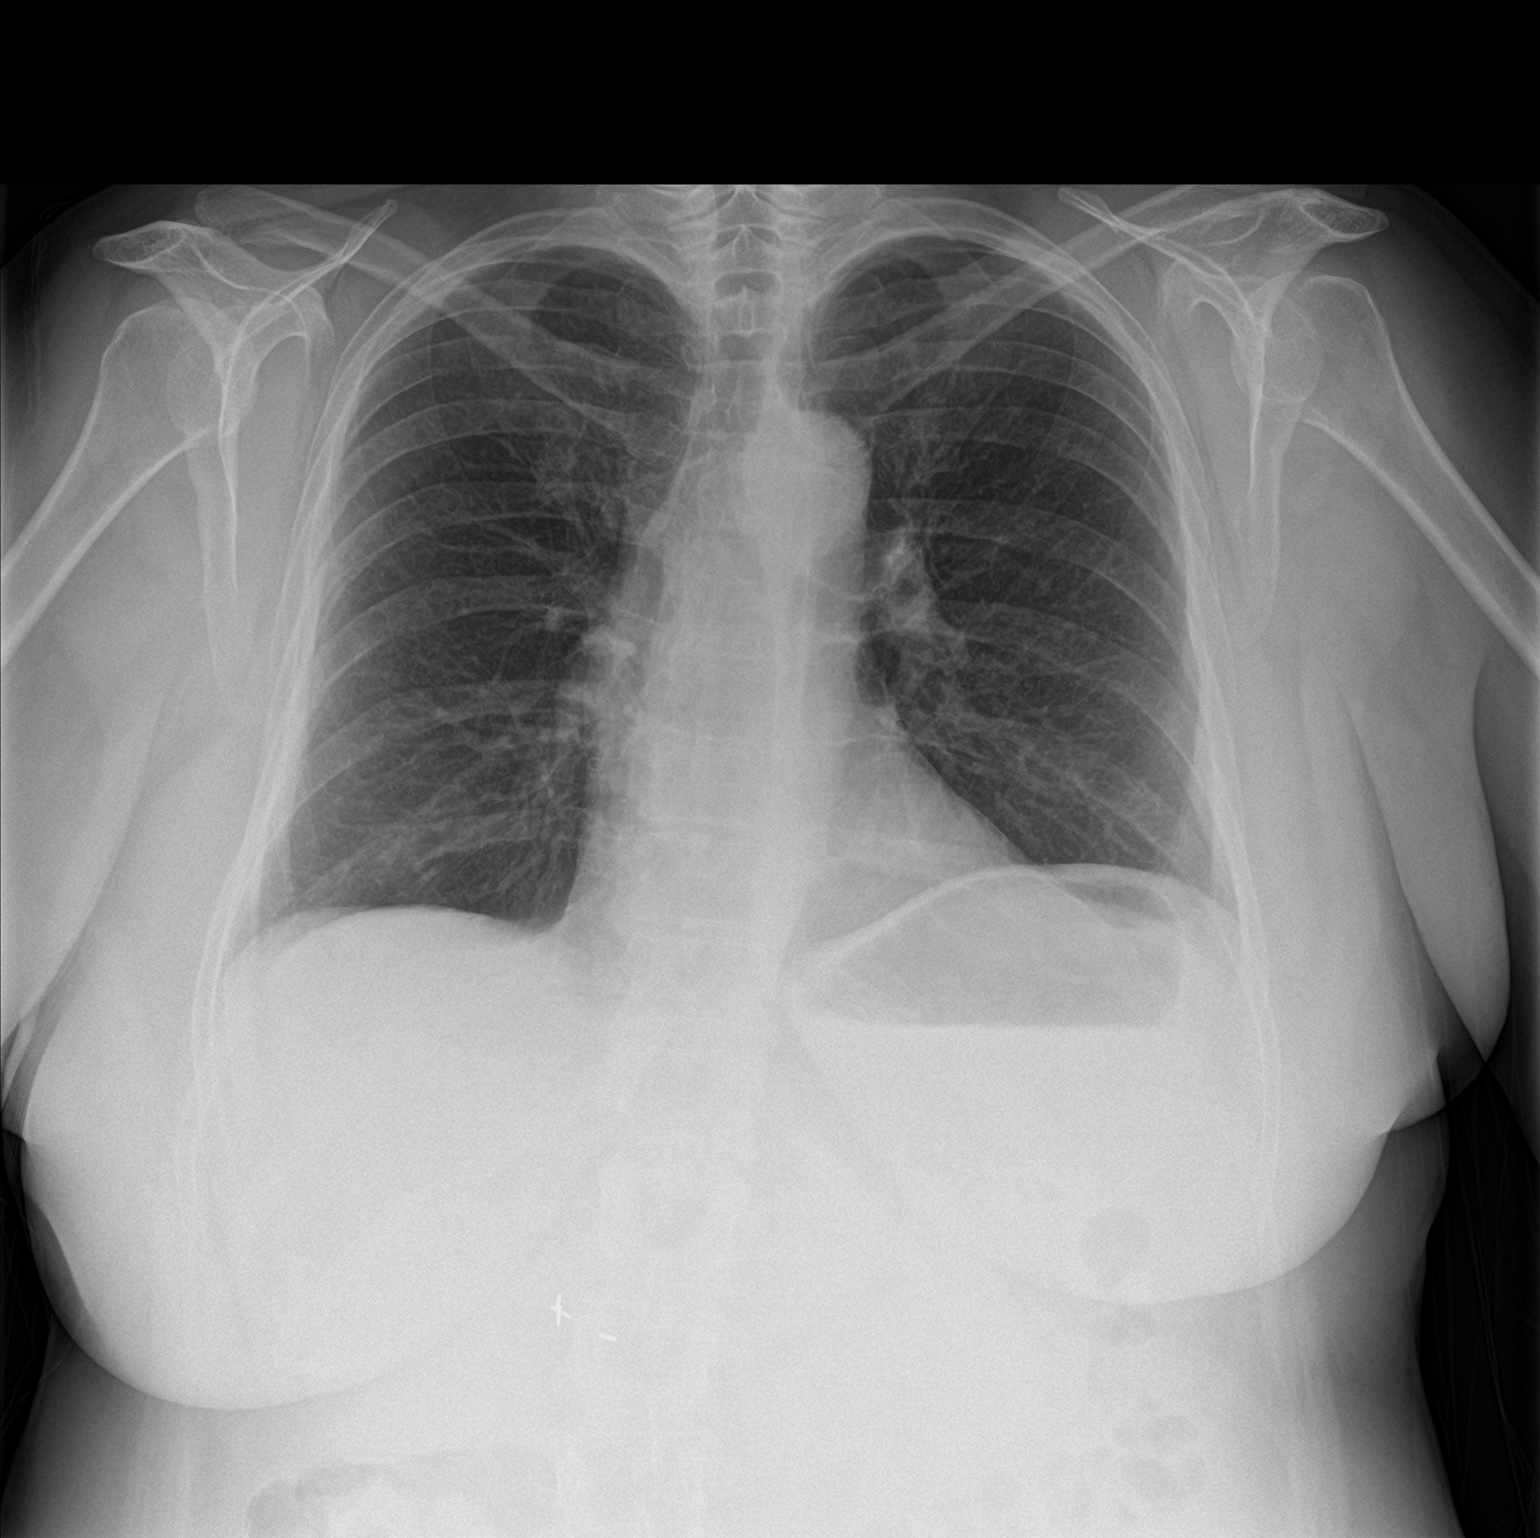
[im 2/2]
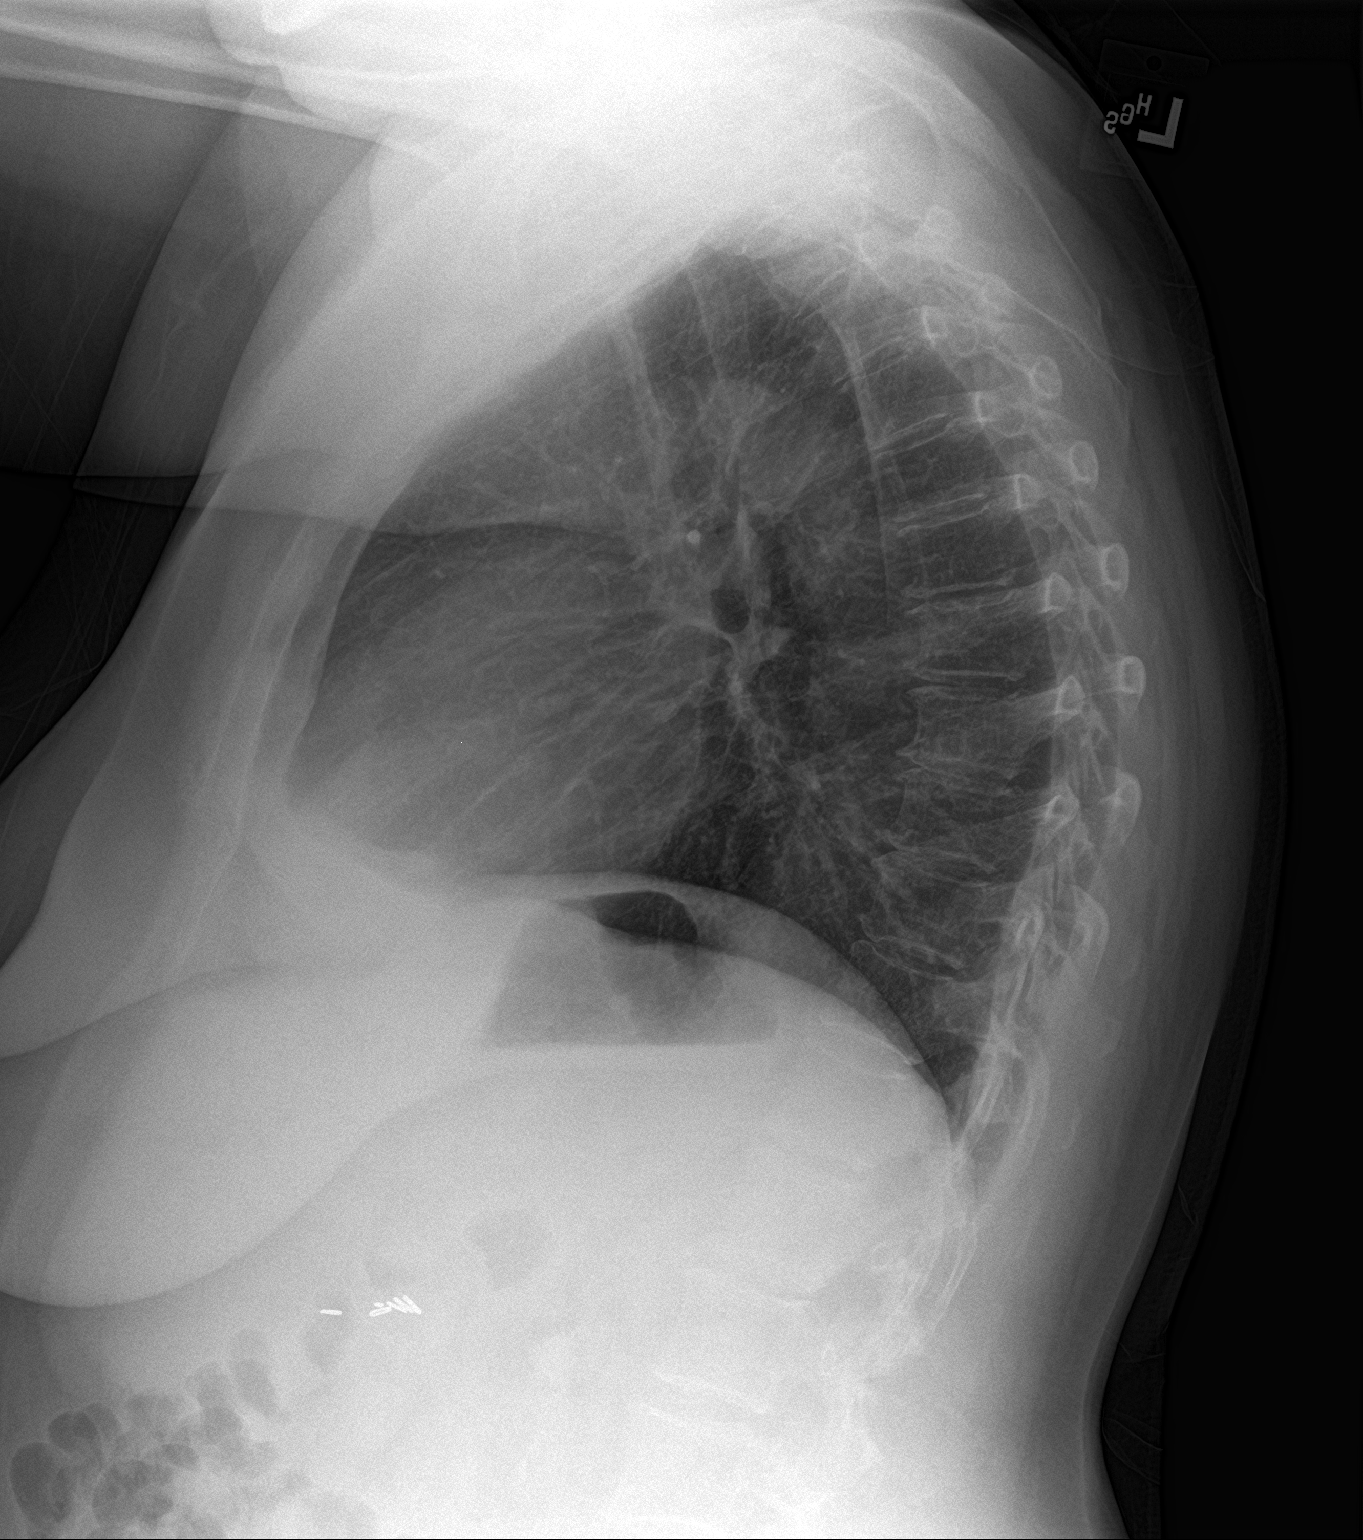

[2 of 2 positions shown; findings below may reference images not displayed]

FINDINGS: The heart size and mediastinal contours are within normal limits.
Both lungs are clear. The visualized skeletal structures are
unremarkable except for minor thoracic degenerative spondylosis.
Remote cholecystectomy. Trachea is midline.
IMPRESSION: No active cardiopulmonary disease.

## 2018-09-12 DIAGNOSIS — I700 Atherosclerosis of aorta: Secondary | ICD-10-CM | POA: Insufficient documentation

## 2018-09-12 DIAGNOSIS — F331 Major depressive disorder, recurrent, moderate: Secondary | ICD-10-CM | POA: Insufficient documentation

## 2018-09-12 DIAGNOSIS — J449 Chronic obstructive pulmonary disease, unspecified: Secondary | ICD-10-CM | POA: Insufficient documentation

## 2018-09-17 ENCOUNTER — Emergency Department
Admission: EM | Admit: 2018-09-17 | Discharge: 2018-09-17 | Disposition: A | Discharge status: Home / Self Care | Payer: Medicare Other | Attending: Emergency Medicine | Admitting: Emergency Medicine

## 2018-09-17 ENCOUNTER — Other Ambulatory Visit: Payer: Self-pay

## 2018-09-17 ENCOUNTER — Encounter: Payer: Self-pay | Admitting: Emergency Medicine

## 2018-09-17 ENCOUNTER — Emergency Department: Payer: Medicare Other

## 2018-09-17 DIAGNOSIS — Z79899 Other long term (current) drug therapy: Secondary | ICD-10-CM | POA: Insufficient documentation

## 2018-09-17 DIAGNOSIS — K439 Ventral hernia without obstruction or gangrene: Secondary | ICD-10-CM | POA: Diagnosis not present

## 2018-09-17 DIAGNOSIS — Z7901 Long term (current) use of anticoagulants: Secondary | ICD-10-CM | POA: Insufficient documentation

## 2018-09-17 DIAGNOSIS — R197 Diarrhea, unspecified: Secondary | ICD-10-CM | POA: Diagnosis not present

## 2018-09-17 DIAGNOSIS — I110 Hypertensive heart disease with heart failure: Secondary | ICD-10-CM | POA: Diagnosis not present

## 2018-09-17 DIAGNOSIS — F17210 Nicotine dependence, cigarettes, uncomplicated: Secondary | ICD-10-CM | POA: Diagnosis not present

## 2018-09-17 DIAGNOSIS — I5032 Chronic diastolic (congestive) heart failure: Secondary | ICD-10-CM | POA: Diagnosis not present

## 2018-09-17 DIAGNOSIS — Z85828 Personal history of other malignant neoplasm of skin: Secondary | ICD-10-CM | POA: Diagnosis not present

## 2018-09-17 DIAGNOSIS — R1031 Right lower quadrant pain: Secondary | ICD-10-CM | POA: Diagnosis present

## 2018-09-17 LAB — BASIC METABOLIC PANEL
Anion gap: 11 (ref 5–15)
BUN: 11 mg/dL (ref 6–20)
CO2: 25 mmol/L (ref 22–32)
Calcium: 9.3 mg/dL (ref 8.9–10.3)
Chloride: 101 mmol/L (ref 98–111)
Creatinine, Ser: 0.81 mg/dL (ref 0.44–1.00)
GFR calc Af Amer: >60 mL/min (ref >60)
GFR calc non Af Amer: >60 mL/min (ref >60)
Glucose, Bld: 109 mg/dL — ABNORMAL HIGH (ref 70–99)
Potassium: 3.7 mmol/L (ref 3.5–5.1)
Sodium: 137 mmol/L (ref 135–145)

## 2018-09-17 LAB — CBC WITH DIFFERENTIAL/PLATELET
Abs Immature Granulocytes: 0.05 10*3/uL (ref 0.00–0.07)
Basophils Absolute: 0.1 10*3/uL (ref 0.0–0.1)
Basophils Relative: 1 %
Eosinophils Absolute: 0.1 10*3/uL (ref 0.0–0.5)
Eosinophils Relative: 1 %
HCT: 45.3 % (ref 36.0–46.0)
Hemoglobin: 14.4 g/dL (ref 12.0–15.0)
Immature Granulocytes: 1 %
Lymphocytes Relative: 30 %
Lymphs Abs: 3.2 10*3/uL (ref 0.7–4.0)
MCH: 28 pg (ref 26.0–34.0)
MCHC: 31.8 g/dL (ref 30.0–36.0)
MCV: 88 fL (ref 80.0–100.0)
Monocytes Absolute: 0.8 10*3/uL (ref 0.1–1.0)
Monocytes Relative: 7 %
Neutro Abs: 6.6 10*3/uL (ref 1.7–7.7)
Neutrophils Relative %: 60 %
Platelets: 282 10*3/uL (ref 150–400)
RBC: 5.15 MIL/uL — ABNORMAL HIGH (ref 3.87–5.11)
RDW: 16.8 % — ABNORMAL HIGH (ref 11.5–15.5)
WBC: 10.8 10*3/uL — ABNORMAL HIGH (ref 4.0–10.5)
nRBC: 0 % (ref 0.0–0.2)

## 2018-09-17 LAB — URINALYSIS, COMPLETE (UACMP) WITH MICROSCOPIC
Bacteria, UA: NONE SEEN
Bilirubin Urine: NEGATIVE
Glucose, UA: NEGATIVE mg/dL
Ketones, ur: NEGATIVE mg/dL
Leukocytes, UA: NEGATIVE
Nitrite: NEGATIVE
Protein, ur: NEGATIVE mg/dL
Specific Gravity, Urine: 1.02 (ref 1.005–1.030)
pH: 6 (ref 5.0–8.0)

## 2018-09-17 LAB — LIPASE, BLOOD: Lipase: 45 U/L (ref 11–51)

## 2018-09-17 LAB — HEPATIC FUNCTION PANEL
ALT: 11 U/L (ref 0–44)
AST: 11 U/L — ABNORMAL LOW (ref 15–41)
Albumin: 4.2 g/dL (ref 3.5–5.0)
Alkaline Phosphatase: 105 U/L (ref 38–126)
Bilirubin, Direct: <0.1 mg/dL (ref 0.0–0.2)
Total Bilirubin: 0.4 mg/dL (ref 0.3–1.2)
Total Protein: 8.2 g/dL — ABNORMAL HIGH (ref 6.5–8.1)

## 2018-09-17 LAB — LACTIC ACID, PLASMA: Lactic Acid, Venous: 1.9 mmol/L (ref 0.5–1.9)

## 2018-09-17 MED ORDER — MORPHINE SULFATE (PF) 4 MG/ML IV SOLN
4.0000 mg | Freq: Once | INTRAVENOUS | Status: AC
  Administered 2018-09-17: 4 mg via INTRAVENOUS
  Filled 2018-09-17: qty 1

## 2018-09-17 MED ORDER — TRAMADOL HCL 50 MG PO TABS: 50.0000 mg | ORAL_TABLET | Freq: Four times a day (QID) | ORAL | 0 refills | Status: AC | PRN

## 2018-09-17 MED ORDER — ONDANSETRON HCL 4 MG/2ML IJ SOLN
4.0000 mg | Freq: Once | INTRAMUSCULAR | Status: AC
  Administered 2018-09-17: 4 mg via INTRAVENOUS
  Filled 2018-09-17: qty 2

## 2018-09-17 MED ORDER — IOPAMIDOL (ISOVUE-300) INJECTION 61%
30.0000 mL | Freq: Once | INTRAVENOUS | Status: AC | PRN
  Administered 2018-09-17: 30 mL via ORAL

## 2018-09-17 MED ORDER — IOPAMIDOL (ISOVUE-300) INJECTION 61%
100.0000 mL | Freq: Once | INTRAVENOUS | Status: AC | PRN
  Administered 2018-09-17: 100 mL via INTRAVENOUS

## 2018-09-17 NOTE — Discharge Instructions (Addendum)
You can take the tramadol as needed for pain.  Follow-up with your regular doctor.  Return to the ER for new, worsening, persistent severe pain, fevers, vomiting, blood in the stool, or any other new or worsening symptoms that concern you.

## 2018-09-17 NOTE — ED Notes (Signed)
Urine sent at this time.

## 2018-09-17 NOTE — ED Triage Notes (Signed)
Pt arrives with complaints of right hernia pain. Pt has been told her hernia is inoperable. Pt states this episode of pain started a week prior.

## 2018-09-17 NOTE — ED Notes (Signed)
I-Stat LAC 2.3

## 2018-09-17 NOTE — ED Notes (Signed)
Reports and hernia p[ain all week, today pain the worse. Awaiting md eval. And soft to touch, pos bowel sound x4quads. Denies n/v/f/c/d.

## 2018-09-17 NOTE — ED Notes (Signed)
Patient drinking oral contrast. meds given . Repot to receiving nurse. Awaiting abd CT.

## 2018-09-17 NOTE — ED Notes (Signed)
Pt stated that her Labia had disappeared on the Left side.

## 2018-09-17 NOTE — ED Provider Notes (Signed)
[facility] Emergency Department Provider Note ____________________________________________   First MD Initiated Contact with Patient 09/17/18 1828     (approximate)  I have reviewed the triage vital signs and the nursing notes.   HISTORY  Chief Complaint Hernia    HPI Carla [name] is a 57 y.o. female with PMH as noted below who presents with right lower quadrant abdominal pain over the last week intermittently, constant since this morning, and associated with diarrhea.  The patient denies nausea or vomiting.  She has no fever or urinary symptoms.  She states that the pain is at the site of a hernia which she has had for at least one year.  She denies any prior history of similar pain.  Past Medical History:  Diagnosis Date  . Abdominal aortic atherosclerosis ([place])    a. 05/2017 CTA abd/pelvis: significant atherosclerotic dzs of the infrarenal abd Ao w/ some mural thrombus. No aneurysm or dissection.  . Abdominal pain   . Acute focal ischemia of small intestine (HCC)   . Acute right-sided low back pain with right-sided sciatica 06/13/2017  . AKI (acute kidney injury) ([place]) 07/29/2017  . Baker's cyst of knee, right    a. 07/2016 U/S: 4.1 x 1.4 x 2.9 cystic structure in R poplitetal fossa.  . Bell's palsy   . Borderline diabetes   . Cancer ([place])    skin cancer on nose  . Chest pain    a. 08/2012 Lexiscan MV: EF 54%, non ischemia/infarct.  . CHF (congestive heart failure) ([place])   . Chronic idiopathic constipation   . Chronic mesenteric ischemia ([place])   . Diastolic dysfunction    a. 05/2017 Echo: Ef 60-65%, no rwma, Gr1 DD, no source of cardiac emboli.  . 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 05/2017 CTA Abd/pelvis: apparent thrombus or embolus in prox SMA (70-90%); b. 05/2017 catheter directed tPA, mechanical thrombectomy, and stenting of the SMA.  . Essential hypertension with goal blood pressure less than 140/90 01/19/2016  . Gastroesophageal  reflux disease 01/19/2016  . GERD (gastroesophageal reflux disease)   . History of kidney stones   . Hyperlipidemia   . Hypertension   . Hypotension 07/29/2017  . Morbid obesity with BMI of 40.0-44.9, adult ([place])   . Occlusive mesenteric ischemia ([place]) 06/19/2017  . Personal history of other malignant neoplasm of skin 06/01/2016  . Primary osteoarthritis of both knees 06/13/2017  . Recurrent major depressive disorder ([place]) 01/19/2016  . SBO (small bowel obstruction) ([place]) 08/06/2017  . 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 Urinary tract infection 01/09/2014    Patient Active Problem List   Diagnosis Date Noted  . Status post total knee replacement using cement, right 07/11/2018  . PAF (paroxysmal atrial fibrillation) ([place]) 06/15/2018  . Accelerated hypertension 04/08/2018  . Acute encephalopathy 04/08/2018  . Adjustment disorder with anxiety 08/15/2017  . Chronic idiopathic constipation   . Protein-calorie malnutrition, severe 08/08/2017  . SBO (small bowel obstruction) ([place]) 08/06/2017  . Chronic mesenteric ischemia ([place])   . AKI (acute kidney injury) ([place]) 07/29/2017  . Hypotension 07/29/2017  . 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 Abdominal pain   . Occlusive mesenteric ischemia ([place]) 06/19/2017  . 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 Acute focal ischemia of small intestine (HCC)   . Acute right-sided low back pain with right-sided sciatica 06/13/2017  . Primary osteoarthritis of both knees 06/13/2017  . Pain syndrome, chronic 09/13/2016  .  Morbid obesity with BMI of 40.0-44.9, adult ([place]) 07/16/2016  . Personal history of other malignant neoplasm of skin 06/01/2016  . Chronic pain of both knees 01/19/2016  . HTN (hypertension) 01/19/2016  . Gastroesophageal reflux disease 01/19/2016  . Hyperlipidemia 01/19/2016  . Prediabetes 01/19/2016  . Recurrent major depressive disorder ([place]) 01/19/2016  . Urinary tract infection 01/09/2014  . Chronic  pelvic pain in female 11/14/2013  . Mixed stress and urge urinary incontinence 11/14/2013  . Chest pain 08/06/2012    Past Surgical History:  Procedure Laterality Date  . APPENDECTOMY    . CHOLECYSTECTOMY    . LAPAROTOMY N/A 08/08/2017   Procedure: EXPLORATORY LAPAROTOMY POSSIBLE BOWEL RESECTION;  Surgeon: Jules Husbands, MD;  Location: [facility] ORS;  Service: General;  Laterality: N/A;  . TEE WITHOUT CARDIOVERSION N/A 06/22/2017   Procedure: TRANSESOPHAGEAL ECHOCARDIOGRAM (TEE);  Surgeon: Wellington Hampshire, MD;  Location: [facility] ORS;  Service: Cardiovascular;  Laterality: N/A;  . TOTAL KNEE ARTHROPLASTY Right 07/11/2018   Procedure: TOTAL KNEE ARTHROPLASTY;  Surgeon: Corky Mull, MD;  Location: [facility] ORS;  Service: Orthopedics;  Laterality: Right;  [name] VAGINAL HYSTERECTOMY    . VISCERAL ARTERY INTERVENTION N/A 06/20/2017   Procedure: Visceral Artery Intervention, possible aortic thrombectomy;  Surgeon: Algernon Huxley, MD;  Location: [facility];  Service: Cardiovascular;  Laterality: N/A;  . VISCERAL ARTERY INTERVENTION N/A 01/28/2018   Procedure: VISCERAL ARTERY INTERVENTION;  Surgeon: Algernon Huxley, MD;  Location: [facility];  Service: Cardiovascular;  Laterality: N/A;    Prior to Admission medications   Medication Sig Start Date End Date Taking? Authorizing Provider  amLODipine (NORVASC) 5 MG tablet Take 1 tablet (5 mg total) by mouth daily. 04/10/18   Gladstone Lighter, MD  apixaban (ELIQUIS) 5 MG TABS tablet Take 5 mg by mouth 2 (two) times daily.    [provider]  atorvastatin (LIPITOR) 80 MG tablet Take 80 mg by mouth at bedtime.     [provider]  docusate sodium (COLACE) 100 MG capsule Take 1 capsule (100 mg total) by mouth 2 (two) times daily. 07/15/18   Duanne Guess, PA-C  DULoxetine (CYMBALTA) 30 MG capsule Take 30 mg by mouth daily.  08/24/17   [provider]  ENSURE (ENSURE) Take 237 mLs by mouth 2 (two) times daily.    [provider]  ferrous sulfate 325 (65 FE) MG tablet Take 325 mg by mouth daily.    [provider]  metoprolol tartrate (LOPRESSOR) 50 MG tablet Take 1 tablet (50 mg total) by mouth 2 (two) times daily. 06/15/18   Bettey Costa, MD  omeprazole (PRILOSEC) 20 MG capsule Take 20 mg by mouth 2 (two) times daily before a meal.    [provider]  oxyCODONE (OXY IR/ROXICODONE) 5 MG immediate release tablet Take 1-2 tablets (5-10 mg total) by mouth every 4 (four) hours as needed for moderate pain. 07/12/18   Lattie Corns, PA-C  polyethylene glycol [facility] / Floria Raveling) packet Take 17 g by mouth 2 (two) times daily as needed.     [provider]  traMADol (ULTRAM) 50 MG tablet Take 1 tablet (50 mg total) by mouth every 6 (six) hours as needed for up to 5 days. 09/17/18 09/22/18  Arta Silence, MD    Allergies Patient has no known allergies.  Family History  Problem Relation Age of Onset  . Hypertension Mother   . Heart disease Mother   . Heart attack Mother   .  Breast cancer Mother   . Hypertension Father   . Breast cancer Paternal Aunt     Social History Social History   Tobacco Use  . Smoking status: Current Every Day Smoker    Packs/day: 0.50    Types: Cigarettes  . Smokeless tobacco: Never Used  Substance Use Topics  . Alcohol use: No  . Drug use: No    Review of Systems  Constitutional: No fever. Eyes: No redness. ENT: No sore throat. Cardiovascular: Denies chest pain. Respiratory: Denies shortness of breath. Gastrointestinal: Positive for diarrhea.  Genitourinary: Negative for dysuria.  Musculoskeletal: Negative for back pain. Skin: Negative for rash. Neurological: Negative for headache.   ____________________________________________   PHYSICAL EXAM:  VITAL SIGNS: ED Triage Vitals  Enc Vitals Group     BP 09/17/18 1713 (!) 150/106     Pulse Rate 09/17/18 1713 94     Resp --      Temp 09/17/18 1713 98.3 F (36.8 C)      Temp Source 09/17/18 1713 Oral     SpO2 09/17/18 1713 98 %     Weight 09/17/18 1713 207 lb (93.9 kg)     Height 09/17/18 1713 5\' 2"  (1.575 m)     Head Circumference --      Peak Flow --      Pain Score 09/17/18 1721 7     Pain Loc --      Pain Edu? --      Excl. in [place]? --     Constitutional: Alert and oriented.  Uncomfortable appearing but in no acute distress. Eyes: Conjunctivae are normal.  Head: Atraumatic. Nose: No congestion/rhinnorhea. Mouth/Throat: Mucous membranes are moist.   Neck: Normal range of motion.  Cardiovascular: Good peripheral circulation. Respiratory: Normal respiratory effort.  Gastrointestinal: Lower abdominal ventral and right lower quadrant hernia with focal tenderness.  Hernia is soft and appears reducible.  No distention.  Genitourinary: No flank tenderness. Musculoskeletal: No lower extremity edema.  Extremities warm and well perfused.  Neurologic:  Normal speech and language. No gross focal neurologic deficits are appreciated.  Skin:  Skin is warm and dry. No rash noted. Psychiatric: Mood and affect are normal. Speech and behavior are normal.  ____________________________________________   LABS (all labs ordered are listed, but only abnormal results are displayed)  Labs Reviewed  BASIC METABOLIC PANEL - Abnormal; Notable for the following components:      Result Value   Glucose, Bld 109 (*)    All other components within normal limits  CBC WITH DIFFERENTIAL/PLATELET - Abnormal; Notable for the following components:   WBC 10.8 (*)    RBC 5.15 (*)    RDW 16.8 (*)    All other components within normal limits  HEPATIC FUNCTION PANEL - Abnormal; Notable for the following components:   Total Protein 8.2 (*)    AST 11 (*)    All other components within normal limits  URINALYSIS, COMPLETE (UACMP) WITH MICROSCOPIC - Abnormal; Notable for the following components:   Color, Urine COLORLESS (*)    APPearance CLEAR (*)    Hgb urine dipstick SMALL (*)      All other components within normal limits  LIPASE, BLOOD  LACTIC ACID, PLASMA  LACTIC ACID, PLASMA   ____________________________________________  EKG   ____________________________________________  RADIOLOGY  CT abdomen: Ventral hernia with no evidence of incarceration or obstruction.  No other acute abnormalities.  ____________________________________________   PROCEDURES  Procedure(s) performed: No  Procedures  Critical Care performed: No ____________________________________________  INITIAL IMPRESSION / ASSESSMENT AND PLAN / ED COURSE  Pertinent labs & imaging results that were available during my care of the patient were reviewed by me and considered in my medical decision making (see chart for details).  57 year old female with PMH as noted above including extensive vascular history and prior abdominal surgical history presents with pain at the site of a ventral lower abdominal hernia on the right side which has been occurring over the last week but worse today.  She also reports some diarrhea.  I reviewed the past medical records in epic; the patient has had some prior ED visits and admissions over the last year for unrelated symptoms.  She had an SMA stent placed in April of this year and an exploratory laparotomy last year.  On exam the patient has some tenderness in the right lower quadrant.  Her vital signs are normal except for hypertension.  Differential includes incarcerated hernia, colitis, diverticulitis, bowel obstruction, or enteritis or other benign cause.  I am also considering UTI/cystitis.  We will obtain labs, CT abdomen, and reassess.  ----------------------------------------- 10:37 PM on 09/17/2018 -----------------------------------------  CT showed no concerning acute findings.  There is a ventral hernia but no evidence of incarceration or obstruction.  The patient's UA and other labs are also unremarkable.  The patient asked me to  perform a pelvic exam because she reported that she felt like her left labia minora had retracted and she was concerned that she could have a mass or other abnormality there.  The exam was performed.  The patient has normal bilateral labia.  I advised her that she may be having normal age-related atrophy and it is just more pronounced on the left.  There does not appear to be any mass or acute abnormality.  The patient is stable for discharge home at this time.  She is remained comfortable throughout her ED stay after the initial evaluation and pain medication.  I suspect that the patient is having a mild enteritis which she is feeling more acutely in the area of the ventral hernia.  I will prescribe the patient some pain medicine for home.  I counseled her on the results of the work-up.  Return precautions given, and she expressed understanding.  ____________________________________________   FINAL CLINICAL IMPRESSION(S) / ED DIAGNOSES  Final diagnoses:  Ventral hernia without obstruction or gangrene  Diarrhea, unspecified type      NEW MEDICATIONS STARTED DURING THIS VISIT:  New Prescriptions   TRAMADOL (ULTRAM) 50 MG TABLET    Take 1 tablet (50 mg total) by mouth every 6 (six) hours as needed for up to 5 days.     Note:  This document was prepared using Dragon voice recognition software and may include unintentional dictation errors.    Arta Silence, MD 09/17/18 2239

## 2018-09-19 ENCOUNTER — Emergency Department: Payer: Medicare Other

## 2018-09-19 ENCOUNTER — Emergency Department
Admission: EM | Admit: 2018-09-19 | Discharge: 2018-09-20 | Disposition: A | Discharge status: Home / Self Care | Payer: Medicare Other | Attending: Emergency Medicine | Admitting: Emergency Medicine

## 2018-09-19 ENCOUNTER — Encounter: Payer: Self-pay | Admitting: Emergency Medicine

## 2018-09-19 ENCOUNTER — Other Ambulatory Visit: Payer: Self-pay

## 2018-09-19 DIAGNOSIS — Y939 Activity, unspecified: Secondary | ICD-10-CM | POA: Diagnosis not present

## 2018-09-19 DIAGNOSIS — W19XXXA Unspecified fall, initial encounter: Secondary | ICD-10-CM | POA: Diagnosis not present

## 2018-09-19 DIAGNOSIS — Z79899 Other long term (current) drug therapy: Secondary | ICD-10-CM | POA: Insufficient documentation

## 2018-09-19 DIAGNOSIS — I110 Hypertensive heart disease with heart failure: Secondary | ICD-10-CM | POA: Diagnosis not present

## 2018-09-19 DIAGNOSIS — Z7901 Long term (current) use of anticoagulants: Secondary | ICD-10-CM | POA: Insufficient documentation

## 2018-09-19 DIAGNOSIS — Y998 Other external cause status: Secondary | ICD-10-CM | POA: Insufficient documentation

## 2018-09-19 DIAGNOSIS — Y929 Unspecified place or not applicable: Secondary | ICD-10-CM | POA: Diagnosis not present

## 2018-09-19 DIAGNOSIS — F17210 Nicotine dependence, cigarettes, uncomplicated: Secondary | ICD-10-CM | POA: Insufficient documentation

## 2018-09-19 DIAGNOSIS — Z85828 Personal history of other malignant neoplasm of skin: Secondary | ICD-10-CM | POA: Insufficient documentation

## 2018-09-19 DIAGNOSIS — S299XXA Unspecified injury of thorax, initial encounter: Secondary | ICD-10-CM | POA: Diagnosis present

## 2018-09-19 DIAGNOSIS — S20212A Contusion of left front wall of thorax, initial encounter: Secondary | ICD-10-CM

## 2018-09-19 DIAGNOSIS — Z96651 Presence of right artificial knee joint: Secondary | ICD-10-CM | POA: Insufficient documentation

## 2018-09-19 DIAGNOSIS — I509 Heart failure, unspecified: Secondary | ICD-10-CM | POA: Insufficient documentation

## 2018-09-19 LAB — COMPREHENSIVE METABOLIC PANEL
ALT: 10 U/L (ref 0–44)
AST: 14 U/L — ABNORMAL LOW (ref 15–41)
Albumin: 3.6 g/dL (ref 3.5–5.0)
Alkaline Phosphatase: 101 U/L (ref 38–126)
Anion gap: 11 (ref 5–15)
BUN: 16 mg/dL (ref 6–20)
CO2: 24 mmol/L (ref 22–32)
Calcium: 8.9 mg/dL (ref 8.9–10.3)
Chloride: 101 mmol/L (ref 98–111)
Creatinine, Ser: 1.08 mg/dL — ABNORMAL HIGH (ref 0.44–1.00)
GFR calc Af Amer: >60 mL/min (ref >60)
GFR calc non Af Amer: 57 mL/min — ABNORMAL LOW (ref >60)
Glucose, Bld: 142 mg/dL — ABNORMAL HIGH (ref 70–99)
Potassium: 3.6 mmol/L (ref 3.5–5.1)
Sodium: 136 mmol/L (ref 135–145)
Total Bilirubin: 0.4 mg/dL (ref 0.3–1.2)
Total Protein: 7.2 g/dL (ref 6.5–8.1)

## 2018-09-19 LAB — CBC WITH DIFFERENTIAL/PLATELET
Abs Immature Granulocytes: 0.04 10*3/uL (ref 0.00–0.07)
Basophils Absolute: 0.1 10*3/uL (ref 0.0–0.1)
Basophils Relative: 1 %
Eosinophils Absolute: 0.1 10*3/uL (ref 0.0–0.5)
Eosinophils Relative: 1 %
HCT: 40.9 % (ref 36.0–46.0)
Hemoglobin: 12.9 g/dL (ref 12.0–15.0)
Immature Granulocytes: 0 %
Lymphocytes Relative: 29 %
Lymphs Abs: 2.9 10*3/uL (ref 0.7–4.0)
MCH: 28.5 pg (ref 26.0–34.0)
MCHC: 31.5 g/dL (ref 30.0–36.0)
MCV: 90.3 fL (ref 80.0–100.0)
Monocytes Absolute: 0.6 10*3/uL (ref 0.1–1.0)
Monocytes Relative: 6 %
Neutro Abs: 6.4 10*3/uL (ref 1.7–7.7)
Neutrophils Relative %: 63 %
Platelets: 262 10*3/uL (ref 150–400)
RBC: 4.53 MIL/uL (ref 3.87–5.11)
RDW: 16.8 % — ABNORMAL HIGH (ref 11.5–15.5)
WBC: 10.1 10*3/uL (ref 4.0–10.5)
nRBC: 0 % (ref 0.0–0.2)

## 2018-09-19 MED ORDER — ONDANSETRON HCL 4 MG/2ML IJ SOLN
INTRAMUSCULAR | Status: AC
  Administered 2018-09-19: 23:00:00
  Filled 2018-09-19: qty 2

## 2018-09-19 MED ORDER — MORPHINE SULFATE (PF) 4 MG/ML IV SOLN
INTRAVENOUS | Status: AC
  Administered 2018-09-19: 23:00:00
  Filled 2018-09-19: qty 1

## 2018-09-19 MED ORDER — IOPAMIDOL (ISOVUE-300) INJECTION 61%
100.0000 mL | Freq: Once | INTRAVENOUS | Status: AC | PRN
  Administered 2018-09-19: 100 mL via INTRAVENOUS

## 2018-09-19 NOTE — ED Provider Notes (Signed)
[facility] Emergency Department Provider Note   ____________________________________________   First MD Initiated Contact with Patient 09/19/18 2215     (approximate)  I have reviewed the triage vital signs and the nursing notes.   HISTORY  Chief Complaint Fall    HPI ZAMIYA DILLARD is a 57 y.o. female who fell and hit the left side of her chest.  She complains of pain in the ribs.  Hurts to take a deep breath and hurts to palpation.  Pain is moderately severe.  She has not had this before.  Just happened.   Past Medical History:  Diagnosis Date  . Abdominal aortic atherosclerosis ([place])    a. 05/2017 CTA abd/pelvis: significant atherosclerotic dzs of the infrarenal abd Ao w/ some mural thrombus. No aneurysm or dissection.  . Abdominal pain   . Acute focal ischemia of small intestine (HCC)   . Acute right-sided low back pain with right-sided sciatica 06/13/2017  . AKI (acute kidney injury) ([place]) 07/29/2017  . Baker's cyst of knee, right    a. 07/2016 U/S: 4.1 x 1.4 x 2.9 cystic structure in R poplitetal fossa.  . Bell's palsy   . Borderline diabetes   . Cancer ([place])    skin cancer on nose  . Chest pain    a. 08/2012 Lexiscan MV: EF 54%, non ischemia/infarct.  . CHF (congestive heart failure) ([place])   . Chronic idiopathic constipation   . Chronic mesenteric ischemia ([place])   . Diastolic dysfunction    a. 05/2017 Echo: Ef 60-65%, no rwma, Gr1 DD, no source of cardiac emboli.  . 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 05/2017 CTA Abd/pelvis: apparent thrombus or embolus in prox SMA (70-90%); b. 05/2017 catheter directed tPA, mechanical thrombectomy, and stenting of the SMA.  . Essential hypertension with goal blood pressure less than 140/90 01/19/2016  . Gastroesophageal reflux disease 01/19/2016  . GERD (gastroesophageal reflux disease)   . History of kidney stones   . Hyperlipidemia   . Hypertension   . Hypotension 07/29/2017  . Morbid  obesity with BMI of 40.0-44.9, adult ([place])   . Occlusive mesenteric ischemia ([place]) 06/19/2017  . Personal history of other malignant neoplasm of skin 06/01/2016  . Primary osteoarthritis of both knees 06/13/2017  . Recurrent major depressive disorder ([place]) 01/19/2016  . SBO (small bowel obstruction) ([place]) 08/06/2017  . 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 Urinary tract infection 01/09/2014    Patient Active Problem List   Diagnosis Date Noted  . Status post total knee replacement using cement, right 07/11/2018  . PAF (paroxysmal atrial fibrillation) ([place]) 06/15/2018  . Accelerated hypertension 04/08/2018  . Acute encephalopathy 04/08/2018  . Adjustment disorder with anxiety 08/15/2017  . Chronic idiopathic constipation   . Protein-calorie malnutrition, severe 08/08/2017  . SBO (small bowel obstruction) ([place]) 08/06/2017  . Chronic mesenteric ischemia ([place])   . AKI (acute kidney injury) ([place]) 07/29/2017  . Hypotension 07/29/2017  . 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 Abdominal pain   . Occlusive mesenteric ischemia ([place]) 06/19/2017  . 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 Acute focal ischemia of small intestine (HCC)   . Acute right-sided low back pain with right-sided sciatica 06/13/2017  . Primary osteoarthritis of both knees 06/13/2017  . Pain syndrome, chronic 09/13/2016  . Morbid obesity with BMI of 40.0-44.9, adult ([place]) 07/16/2016  . Personal history of other malignant neoplasm of skin 06/01/2016  . Chronic pain of  both knees 01/19/2016  . HTN (hypertension) 01/19/2016  . Gastroesophageal reflux disease 01/19/2016  . Hyperlipidemia 01/19/2016  . Prediabetes 01/19/2016  . Recurrent major depressive disorder ([place]) 01/19/2016  . Urinary tract infection 01/09/2014  . Chronic pelvic pain in female 11/14/2013  . Mixed stress and urge urinary incontinence 11/14/2013  . Chest pain 08/06/2012    Past Surgical History:  Procedure Laterality Date    . APPENDECTOMY    . CHOLECYSTECTOMY    . LAPAROTOMY N/A 08/08/2017   Procedure: EXPLORATORY LAPAROTOMY POSSIBLE BOWEL RESECTION;  Surgeon: Jules Husbands, MD;  Location: [facility] ORS;  Service: General;  Laterality: N/A;  . TEE WITHOUT CARDIOVERSION N/A 06/22/2017   Procedure: TRANSESOPHAGEAL ECHOCARDIOGRAM (TEE);  Surgeon: Wellington Hampshire, MD;  Location: [facility] ORS;  Service: Cardiovascular;  Laterality: N/A;  . TOTAL KNEE ARTHROPLASTY Right 07/11/2018   Procedure: TOTAL KNEE ARTHROPLASTY;  Surgeon: Corky Mull, MD;  Location: [facility] ORS;  Service: Orthopedics;  Laterality: Right;  [name] VAGINAL HYSTERECTOMY    . VISCERAL ARTERY INTERVENTION N/A 06/20/2017   Procedure: Visceral Artery Intervention, possible aortic thrombectomy;  Surgeon: Algernon Huxley, MD;  Location: [facility];  Service: Cardiovascular;  Laterality: N/A;  . VISCERAL ARTERY INTERVENTION N/A 01/28/2018   Procedure: VISCERAL ARTERY INTERVENTION;  Surgeon: Algernon Huxley, MD;  Location: [facility];  Service: Cardiovascular;  Laterality: N/A;    Prior to Admission medications   Medication Sig Start Date End Date Taking? Authorizing Provider  amLODipine (NORVASC) 5 MG tablet Take 1 tablet (5 mg total) by mouth daily. 04/10/18   Gladstone Lighter, MD  apixaban (ELIQUIS) 5 MG TABS tablet Take 5 mg by mouth 2 (two) times daily.    [provider]  atorvastatin (LIPITOR) 80 MG tablet Take 80 mg by mouth at bedtime.     [provider]  docusate sodium (COLACE) 100 MG capsule Take 1 capsule (100 mg total) by mouth 2 (two) times daily. 07/15/18   Duanne Guess, PA-C  DULoxetine (CYMBALTA) 30 MG capsule Take 30 mg by mouth daily.  08/24/17   [provider]  ENSURE (ENSURE) Take 237 mLs by mouth 2 (two) times daily.    [provider]  ferrous sulfate 325 (65 FE) MG tablet Take 325 mg by mouth daily.    [provider]  metoprolol tartrate (LOPRESSOR) 50 MG tablet Take 1 tablet (50 mg  total) by mouth 2 (two) times daily. 06/15/18   Bettey Costa, MD  omeprazole (PRILOSEC) 20 MG capsule Take 20 mg by mouth 2 (two) times daily before a meal.    [provider]  oxyCODONE (OXY IR/ROXICODONE) 5 MG immediate release tablet Take 1-2 tablets (5-10 mg total) by mouth every 4 (four) hours as needed for moderate pain. 07/12/18   Lattie Corns, PA-C  polyethylene glycol [facility] / Floria Raveling) packet Take 17 g by mouth 2 (two) times daily as needed.     [provider]  traMADol (ULTRAM) 50 MG tablet Take 1 tablet (50 mg total) by mouth every 6 (six) hours as needed for up to 5 days. 09/17/18 09/22/18  Arta Silence, MD    Allergies Patient has no known allergies.  Family History  Problem Relation Age of Onset  . Hypertension Mother   . Heart disease Mother   . Heart attack Mother   . Breast cancer Mother   . Hypertension Father   . Breast cancer Paternal Aunt     Social History Social  History   Tobacco Use  . Smoking status: Current Every Day Smoker    Packs/day: 0.50    Types: Cigarettes  . Smokeless tobacco: Never Used  Substance Use Topics  . Alcohol use: No  . Drug use: No     Constitutional: No fever/chills Eyes: No visual changes. ENT: No sore throat. Cardiovascular: Left rib pain. Respiratory: Denies shortness of breath. Gastrointestinal: Per abdominal pain.  No nausea, no vomiting.  No diarrhea.  No constipation. Genitourinary: Negative for dysuria. Musculoskeletal: Negative for back pain. Skin: Negative for rash. Neurological: Negative for headaches, focal weakness   ____________________________________________   PHYSICAL EXAM:  VITAL SIGNS: ED Triage Vitals  Enc Vitals Group     BP 09/19/18 2141 137/71     Pulse Rate 09/19/18 2141 68     Resp 09/19/18 2141 18     Temp 09/19/18 2141 97.9 F (36.6 C)     Temp Source 09/19/18 2141 Oral     SpO2 09/19/18 2141 96 %     Weight 09/19/18 2137 206 lb (93.4 kg)     Height  09/19/18 2137 5\' 2"  (1.575 m)     Head Circumference --      Peak Flow --      Pain Score 09/19/18 2137 9     Pain Loc --      Pain Edu? --      Excl. in [place]? --     Constitutional: Alert and oriented.  In pain from left rib injury Eyes: Conjunctivae are normal Head: Atraumatic. Nose: No congestion/rhinnorhea. Mouth/Throat: Mucous membranes are moist.  Oropharynx non-erythematous. Neck: No stridor. Cardiovascular: Normal rate, regular rhythm. Grossly normal heart sounds.  Good peripheral circulation. Respiratory: Normal respiratory effort.  No retractions. Lungs CTAB.  Pain on palpation of left ribs approximately 9 8 and 7 Gastrointestinal: Soft upper quadrant of the abdomen is also tender to palpation.. No distention. No abdominal bruits. No CVA tenderness. Musculoskeletal: No lower extremity tenderness nor edema.  No joint effusions. Neurologic:  Normal speech and language. No gross focal neurologic deficits are appreciated. No gait instability. Skin:  Skin is warm, dry and intact. No rash noted. Psychiatric: Mood and affect are normal. Speech and behavior are normal.  ____________________________________________   LABS (all labs ordered are listed, but only abnormal results are displayed)  Labs Reviewed  COMPREHENSIVE METABOLIC PANEL - Abnormal; Notable for the following components:      Result Value   Glucose, Bld 142 (*)    Creatinine, Ser 1.08 (*)    AST 14 (*)    GFR calc non Af Amer 57 (*)    All other components within normal limits  CBC WITH DIFFERENTIAL/PLATELET - Abnormal; Notable for the following components:   RDW 16.8 (*)    All other components within normal limits  URINALYSIS, COMPLETE (UACMP) WITH MICROSCOPIC   ____________________________________________  EKG   ____________________________________________  RADIOLOGY  ED MD interpretation:    Official radiology report(s): Dg Chest 2 View  Result Date: 09/19/2018 CLINICAL DATA:  Mechanical fall  after she got caught on cement. Fell striking ribs with left-sided pain. EXAM: CHEST - 2 VIEW COMPARISON:  Radiographs 07/08/2018 FINDINGS: The cardiomediastinal contours are normal. Mild streaky bibasilar atelectasis. Pulmonary vasculature is normal. No consolidation, pleural effusion, or pneumothorax. No visualized rib fracture. Mild degenerative change in the spine. IMPRESSION: 1. Mild streaky bibasilar atelectasis.  No pneumothorax. 2. No visualized rib fracture. Electronically Signed   By: Keith Rake M.D.   On: 09/19/2018  22:53    ____________________________________________   PROCEDURES  Procedure(s) performed:   Procedures  Critical Care performed:   ____________________________________________   INITIAL IMPRESSION / ASSESSMENT AND PLAN / ED COURSE  We will evaluate to make sure the patient did not do anything to her liver spleen or kidney.  I will sign the patient out to Dr. Owens Shark.         ____________________________________________   FINAL CLINICAL IMPRESSION(S) / ED DIAGNOSES  Final diagnoses:  Fall, initial encounter     ED Discharge Orders    None       Note:  This document was prepared using Dragon voice recognition software and may include unintentional dictation errors.    Nena Polio, MD 09/19/18 2322

## 2018-09-19 NOTE — ED Triage Notes (Signed)
Pt presents to ED s/p mechanical fall when her shoe got caught on cement. Pt c/o L rib pain s/p fall that is worse with deep inspiration.

## 2018-09-19 NOTE — ED Notes (Signed)
Pt back to room at this time

## 2018-09-24 ENCOUNTER — Emergency Department
Admission: EM | Admit: 2018-09-24 | Discharge: 2018-09-24 | Disposition: A | Discharge status: Home / Self Care | Payer: Medicare Other | Attending: Emergency Medicine | Admitting: Emergency Medicine

## 2018-09-24 ENCOUNTER — Other Ambulatory Visit: Payer: Self-pay

## 2018-09-24 ENCOUNTER — Encounter: Payer: Self-pay | Admitting: Emergency Medicine

## 2018-09-24 DIAGNOSIS — F17210 Nicotine dependence, cigarettes, uncomplicated: Secondary | ICD-10-CM | POA: Insufficient documentation

## 2018-09-24 DIAGNOSIS — G8929 Other chronic pain: Secondary | ICD-10-CM | POA: Insufficient documentation

## 2018-09-24 DIAGNOSIS — Z7901 Long term (current) use of anticoagulants: Secondary | ICD-10-CM | POA: Insufficient documentation

## 2018-09-24 DIAGNOSIS — M25561 Pain in right knee: Secondary | ICD-10-CM | POA: Diagnosis not present

## 2018-09-24 DIAGNOSIS — I509 Heart failure, unspecified: Secondary | ICD-10-CM | POA: Diagnosis not present

## 2018-09-24 DIAGNOSIS — Z79899 Other long term (current) drug therapy: Secondary | ICD-10-CM | POA: Insufficient documentation

## 2018-09-24 DIAGNOSIS — I110 Hypertensive heart disease with heart failure: Secondary | ICD-10-CM | POA: Insufficient documentation

## 2018-09-24 DIAGNOSIS — M25551 Pain in right hip: Secondary | ICD-10-CM | POA: Diagnosis present

## 2018-09-24 MED ORDER — OXYCODONE-ACETAMINOPHEN 5-325 MG PO TABS
1.0000 | ORAL_TABLET | Freq: Once | ORAL | Status: AC
  Administered 2018-09-24: 1 via ORAL
  Filled 2018-09-24: qty 1

## 2018-09-24 MED ORDER — MELOXICAM 7.5 MG PO TABS
15.0000 mg | ORAL_TABLET | Freq: Once | ORAL | Status: AC
  Administered 2018-09-24: 15 mg via ORAL
  Filled 2018-09-24: qty 2

## 2018-09-24 MED ORDER — MELOXICAM 15 MG PO TABS: 15.0000 mg | ORAL_TABLET | Freq: Every day | ORAL | 0 refills | Status: DC

## 2018-09-24 NOTE — ED Provider Notes (Signed)
[facility] Emergency Department Provider Note  ____________________________________________  Time seen: Approximately 11:09 PM  I have reviewed the triage vital signs and the nursing notes.   HISTORY  Chief Complaint Hip Pain    HPI Carla [name] is a 57 y.o. female who presents the emergency department complaining of right hip, right knee pain.  Patient initially reports that symptoms have only been ongoing x1 week.  I reviewed the patient's medical record and it reveals that patient has been complaining of ongoing hip and knee pain for significant period of time.  Patient has had both her orthopedic surgeon as well as her primary care refused to write her more narcotics and they were referring her to pain management.  When I discussed this finding with the patient, she verbalizes that the pain had been ongoing for much longer than 1 week.  No injury.  Patient denies any swelling, erythema of either joint.  Patient reports that the pain is constant, worse with any movement or weightbearing.  Patient denies any other complaints at this time.  She is insistent that I write narcotic prescription for her.    Past Medical History:  Diagnosis Date  . Abdominal aortic atherosclerosis ([place])    a. 05/2017 CTA abd/pelvis: significant atherosclerotic dzs of the infrarenal abd Ao w/ some mural thrombus. No aneurysm or dissection.  . Abdominal pain   . Acute focal ischemia of small intestine (HCC)   . Acute right-sided low back pain with right-sided sciatica 06/13/2017  . AKI (acute kidney injury) ([place]) 07/29/2017  . Baker's cyst of knee, right    a. 07/2016 U/S: 4.1 x 1.4 x 2.9 cystic structure in R poplitetal fossa.  . Bell's palsy   . Borderline diabetes   . Cancer ([place])    skin cancer on nose  . Chest pain    a. 08/2012 Lexiscan MV: EF 54%, non ischemia/infarct.  . CHF (congestive heart failure) ([place])   . Chronic idiopathic constipation   . Chronic mesenteric  ischemia ([place])   . Diastolic dysfunction    a. 05/2017 Echo: Ef 60-65%, no rwma, Gr1 DD, no source of cardiac emboli.  . 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 05/2017 CTA Abd/pelvis: apparent thrombus or embolus in prox SMA (70-90%); b. 05/2017 catheter directed tPA, mechanical thrombectomy, and stenting of the SMA.  . Essential hypertension with goal blood pressure less than 140/90 01/19/2016  . Gastroesophageal reflux disease 01/19/2016  . GERD (gastroesophageal reflux disease)   . History of kidney stones   . Hyperlipidemia   . Hypertension   . Hypotension 07/29/2017  . Morbid obesity with BMI of 40.0-44.9, adult ([place])   . Occlusive mesenteric ischemia ([place]) 06/19/2017  . Personal history of other malignant neoplasm of skin 06/01/2016  . Primary osteoarthritis of both knees 06/13/2017  . Recurrent major depressive disorder ([place]) 01/19/2016  . SBO (small bowel obstruction) ([place]) 08/06/2017  . 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 Urinary tract infection 01/09/2014    Patient Active Problem List   Diagnosis Date Noted  . Status post total knee replacement using cement, right 07/11/2018  . PAF (paroxysmal atrial fibrillation) ([place]) 06/15/2018  . Accelerated hypertension 04/08/2018  . Acute encephalopathy 04/08/2018  . Adjustment disorder with anxiety 08/15/2017  . Chronic idiopathic constipation   . Protein-calorie malnutrition, severe 08/08/2017  . SBO (small bowel obstruction) ([place]) 08/06/2017  . Chronic mesenteric ischemia ([place])   . AKI (acute kidney injury) ([place]) 07/29/2017  .  Hypotension 07/29/2017  . 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 Abdominal pain   . Occlusive mesenteric ischemia ([place]) 06/19/2017  . 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 Acute focal ischemia of small intestine (HCC)   . Acute right-sided low back pain with right-sided sciatica 06/13/2017  . Primary osteoarthritis of both knees 06/13/2017  . Pain syndrome, chronic  09/13/2016  . Morbid obesity with BMI of 40.0-44.9, adult ([place]) 07/16/2016  . Personal history of other malignant neoplasm of skin 06/01/2016  . Chronic pain of both knees 01/19/2016  . HTN (hypertension) 01/19/2016  . Gastroesophageal reflux disease 01/19/2016  . Hyperlipidemia 01/19/2016  . Prediabetes 01/19/2016  . Recurrent major depressive disorder ([place]) 01/19/2016  . Urinary tract infection 01/09/2014  . Chronic pelvic pain in female 11/14/2013  . Mixed stress and urge urinary incontinence 11/14/2013  . Chest pain 08/06/2012    Past Surgical History:  Procedure Laterality Date  . APPENDECTOMY    . CHOLECYSTECTOMY    . LAPAROTOMY N/A 08/08/2017   Procedure: EXPLORATORY LAPAROTOMY POSSIBLE BOWEL RESECTION;  Surgeon: Jules Husbands, MD;  Location: [facility] ORS;  Service: General;  Laterality: N/A;  . TEE WITHOUT CARDIOVERSION N/A 06/22/2017   Procedure: TRANSESOPHAGEAL ECHOCARDIOGRAM (TEE);  Surgeon: Wellington Hampshire, MD;  Location: [facility] ORS;  Service: Cardiovascular;  Laterality: N/A;  . TOTAL KNEE ARTHROPLASTY Right 07/11/2018   Procedure: TOTAL KNEE ARTHROPLASTY;  Surgeon: Corky Mull, MD;  Location: [facility] ORS;  Service: Orthopedics;  Laterality: Right;  [name] VAGINAL HYSTERECTOMY    . VISCERAL ARTERY INTERVENTION N/A 06/20/2017   Procedure: Visceral Artery Intervention, possible aortic thrombectomy;  Surgeon: Algernon Huxley, MD;  Location: [facility];  Service: Cardiovascular;  Laterality: N/A;  . VISCERAL ARTERY INTERVENTION N/A 01/28/2018   Procedure: VISCERAL ARTERY INTERVENTION;  Surgeon: Algernon Huxley, MD;  Location: [facility];  Service: Cardiovascular;  Laterality: N/A;    Prior to Admission medications   Medication Sig Start Date End Date Taking? Authorizing Provider  amLODipine (NORVASC) 5 MG tablet Take 1 tablet (5 mg total) by mouth daily. 04/10/18   Gladstone Lighter, MD  apixaban (ELIQUIS) 5 MG TABS tablet Take 5 mg by mouth 2 (two) times daily.     [provider]  atorvastatin (LIPITOR) 80 MG tablet Take 80 mg by mouth at bedtime.     [provider]  docusate sodium (COLACE) 100 MG capsule Take 1 capsule (100 mg total) by mouth 2 (two) times daily. 07/15/18   Duanne Guess, PA-C  DULoxetine (CYMBALTA) 30 MG capsule Take 30 mg by mouth daily.  08/24/17   [provider]  ENSURE (ENSURE) Take 237 mLs by mouth 2 (two) times daily.    [provider]  ferrous sulfate 325 (65 FE) MG tablet Take 325 mg by mouth daily.    [provider]  meloxicam (MOBIC) 15 MG tablet Take 1 tablet (15 mg total) by mouth daily. 09/24/18   Takeya Marquis, Charline Bills, PA-C  metoprolol tartrate (LOPRESSOR) 50 MG tablet Take 1 tablet (50 mg total) by mouth 2 (two) times daily. 06/15/18   Bettey Costa, MD  omeprazole (PRILOSEC) 20 MG capsule Take 20 mg by mouth 2 (two) times daily before a meal.    [provider]  oxyCODONE (OXY IR/ROXICODONE) 5 MG immediate release tablet Take 1-2 tablets (5-10 mg total) by mouth every 4 (four) hours as needed for moderate pain. 07/12/18   Lattie Corns, PA-C  polyethylene glycol (MIRALAX / GLYCOLAX) packet Take 17 g by mouth 2 (two) times daily as needed.     [provider]    Allergies Patient has no known allergies.  Family History  Problem Relation Age of Onset  . Hypertension Mother   . Heart disease Mother   . Heart attack Mother   . Breast cancer Mother   . Hypertension Father   . Breast cancer Paternal Aunt     Social History Social History   Tobacco Use  . Smoking status: Current Every Day Smoker    Packs/day: 0.50    Types: Cigarettes  . Smokeless tobacco: Never Used  Substance Use Topics  . Alcohol use: No  . Drug use: No     Review of Systems  Constitutional: No fever/chills Eyes: No visual changes. No discharge ENT: No upper respiratory complaints. Cardiovascular: no chest pain. Respiratory: no cough. No SOB. Gastrointestinal: No  abdominal pain.  No nausea, no vomiting.   Musculoskeletal: Positive for right knee and right hip pain Skin: Negative for rash, abrasions, lacerations, ecchymosis. Neurological: Negative for headaches, focal weakness or numbness. 10-point ROS otherwise negative.  ____________________________________________   PHYSICAL EXAM:  VITAL SIGNS: ED Triage Vitals [09/24/18 2055]  Enc Vitals Group     BP 128/72     Pulse Rate 73     Resp 18     Temp 97.7 F (36.5 C)     Temp Source Oral     SpO2 97 %     Weight 206 lb (93.4 kg)     Height 5\' 2"  (1.575 m)     Head Circumference      Peak Flow      Pain Score 9     Pain Loc      Pain Edu?      Excl. in [place]?      Constitutional: Alert and oriented. Well appearing and in no acute distress. Eyes: Conjunctivae are normal. PERRL. EOMI. Head: Atraumatic. Neck: No stridor.    Cardiovascular: Normal rate, regular rhythm. Normal S1 and S2.  Good peripheral circulation. Respiratory: Normal respiratory effort without tachypnea or retractions. Lungs CTAB. Good air entry to the bases with no decreased or absent breath sounds. Musculoskeletal: Full range of motion to all extremities. No gross deformities appreciated.  Visualization of the right knee and right hip reveals no gross erythema, edema, deformity.  Patient is globally tender to palpation everywhere on the right lower extremity.  When asked if any point is more tender than others, patient responds that everywhere hurts equally.  Withdrawing, jumping with palpation globally from lumbar spine to the ankle.  No palpable abnormality or deficits.  Dorsalis pedis pulse intact.  Sensation intact distally. Neurologic:  Normal speech and language. No gross focal neurologic deficits are appreciated.  Skin:  Skin is warm, dry and intact. No rash noted. Psychiatric: Mood and affect are normal. Speech and behavior are normal. Patient exhibits appropriate insight and  judgement.   ____________________________________________   LABS (all labs ordered are listed, but only abnormal results are displayed)  Labs Reviewed - No data to display ____________________________________________  EKG   ____________________________________________  RADIOLOGY   No results found.  ____________________________________________    PROCEDURES  Procedure(s) performed:    Procedures    Medications  oxyCODONE-acetaminophen (PERCOCET/ROXICET) 5-325 MG per tablet 1 tablet (has no administration in time range)  meloxicam (MOBIC) tablet 15 mg (has no administration in time range)     ____________________________________________   INITIAL  IMPRESSION / ASSESSMENT AND PLAN / ED COURSE  Pertinent labs & imaging results that were available during my care of the patient were reviewed by me and considered in my medical decision making (see chart for details).  Review of the [place] CSRS was performed in accordance of the [place] prior to dispensing any controlled drugs.      Patient's diagnosis is consistent with knee pain.  Patient presents the emergency department with ongoing knee and hip pain.  Patient has been evaluated by primary care as well as orthopedics for ongoing pain.  Patient is here requesting narcotic prescription until she can see pain management.  Patient initially stated that her symptoms have been ongoing x1 week.  When discussing patient's medical record, she endorses pain extending much longer than 1 week history.  Patient is insistent that I write narcotics.  She has had 35 prescriptions from 11 different providers over the last year.  In the medical record, both her primary care and orthopedic surgeon refused to fill narcotic prescriptions until she follows with pain management.  At this time, I will not fill a narcotic prescription.  I have offered meloxicam and patient states that she can take NSAIDs.  Patient will be prescribed this medication  and I have informed the patient that neither I nor anybody else in the emergency department will fill an ongoing narcotic prescription for her.  Patient is upset but does understand that she has to wait until her pain management appointment. Patient is given ED precautions to return to the ED for any worsening or new symptoms.     ____________________________________________  FINAL CLINICAL IMPRESSION(S) / ED DIAGNOSES  Final diagnoses:  Chronic pain of right knee      NEW MEDICATIONS STARTED DURING THIS VISIT:  ED Discharge Orders         Ordered    meloxicam (MOBIC) 15 MG tablet  Daily     09/24/18 2315              This chart was dictated using voice recognition software/Dragon. Despite best efforts to proofread, errors can occur which can change the meaning. Any change was purely unintentional.    Darletta Moll, PA-C 09/24/18 2316    Nena Polio, MD 09/25/18 0005

## 2018-09-24 NOTE — ED Triage Notes (Signed)
Patient ambulatory to triage with steady gait, without difficulty or distress noted; pt reports x wk having right hip/back pain radiating down leg; st hx of same with arthritis

## 2018-10-19 IMAGING — CR DG HIP (WITH OR WITHOUT PELVIS) 2-3V*R*
3 series · 3 of 3 positions shown · non-contrast
Comparison: None

CLINICAL DATA: RIGHT hip pain for 3 days

EXAM:
DG HIP (WITH OR WITHOUT PELVIS) 2-3V RIGHT

[hip ap]
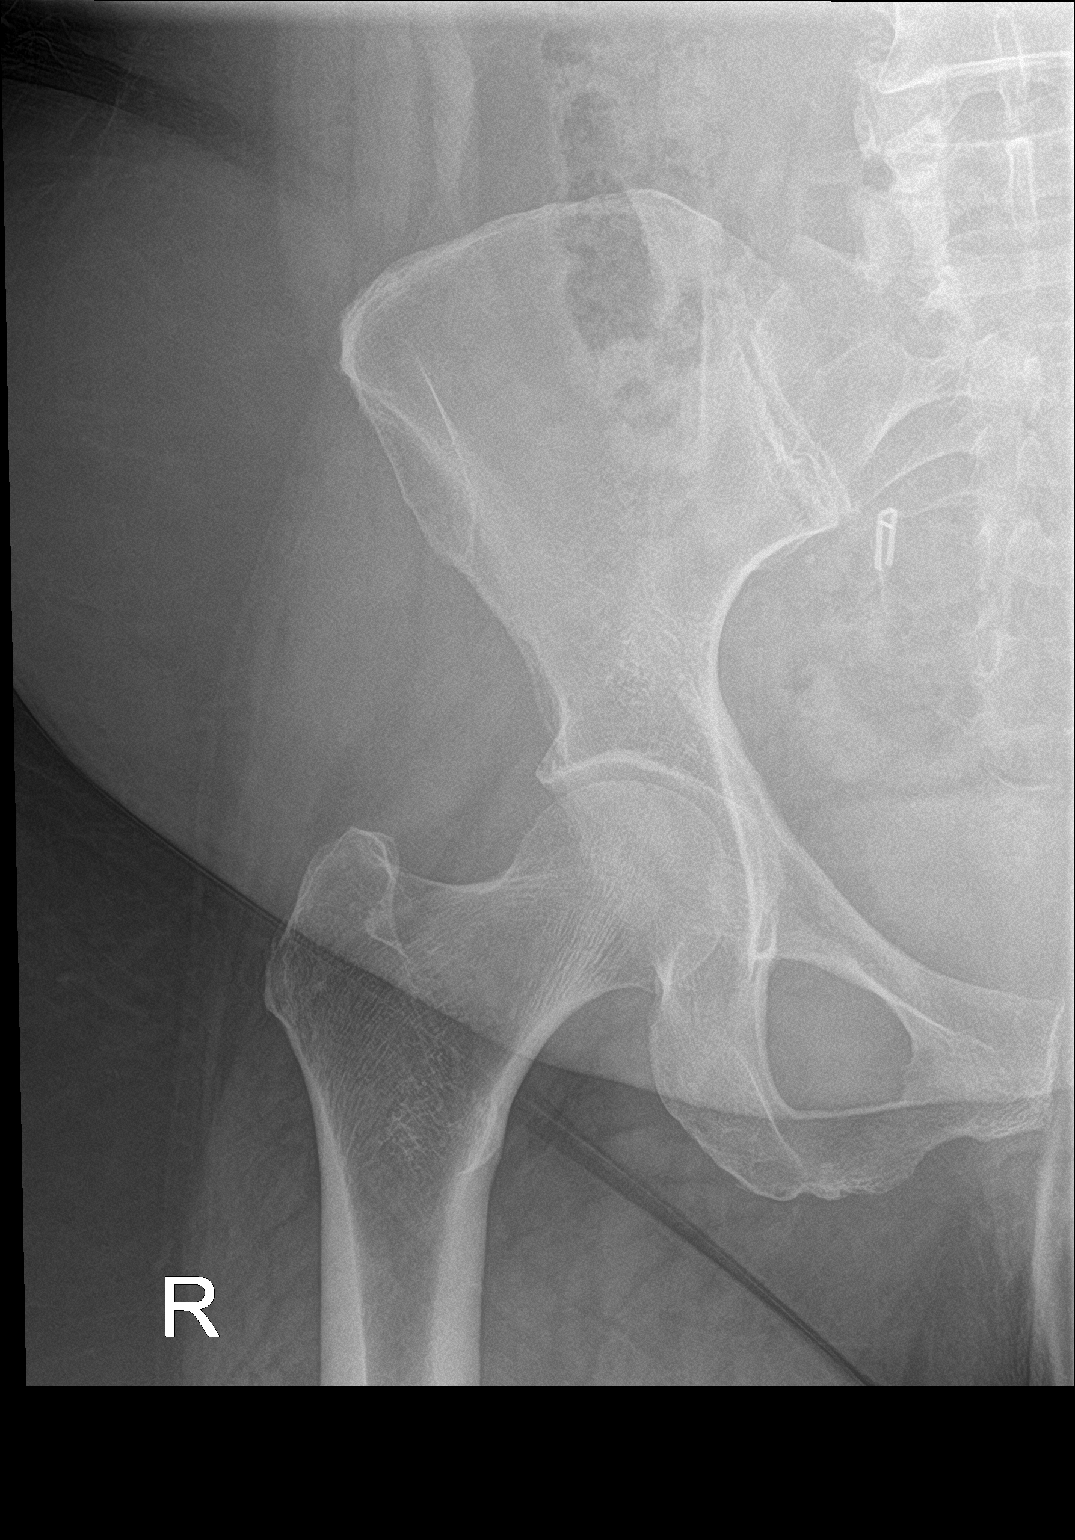

[hip lat]
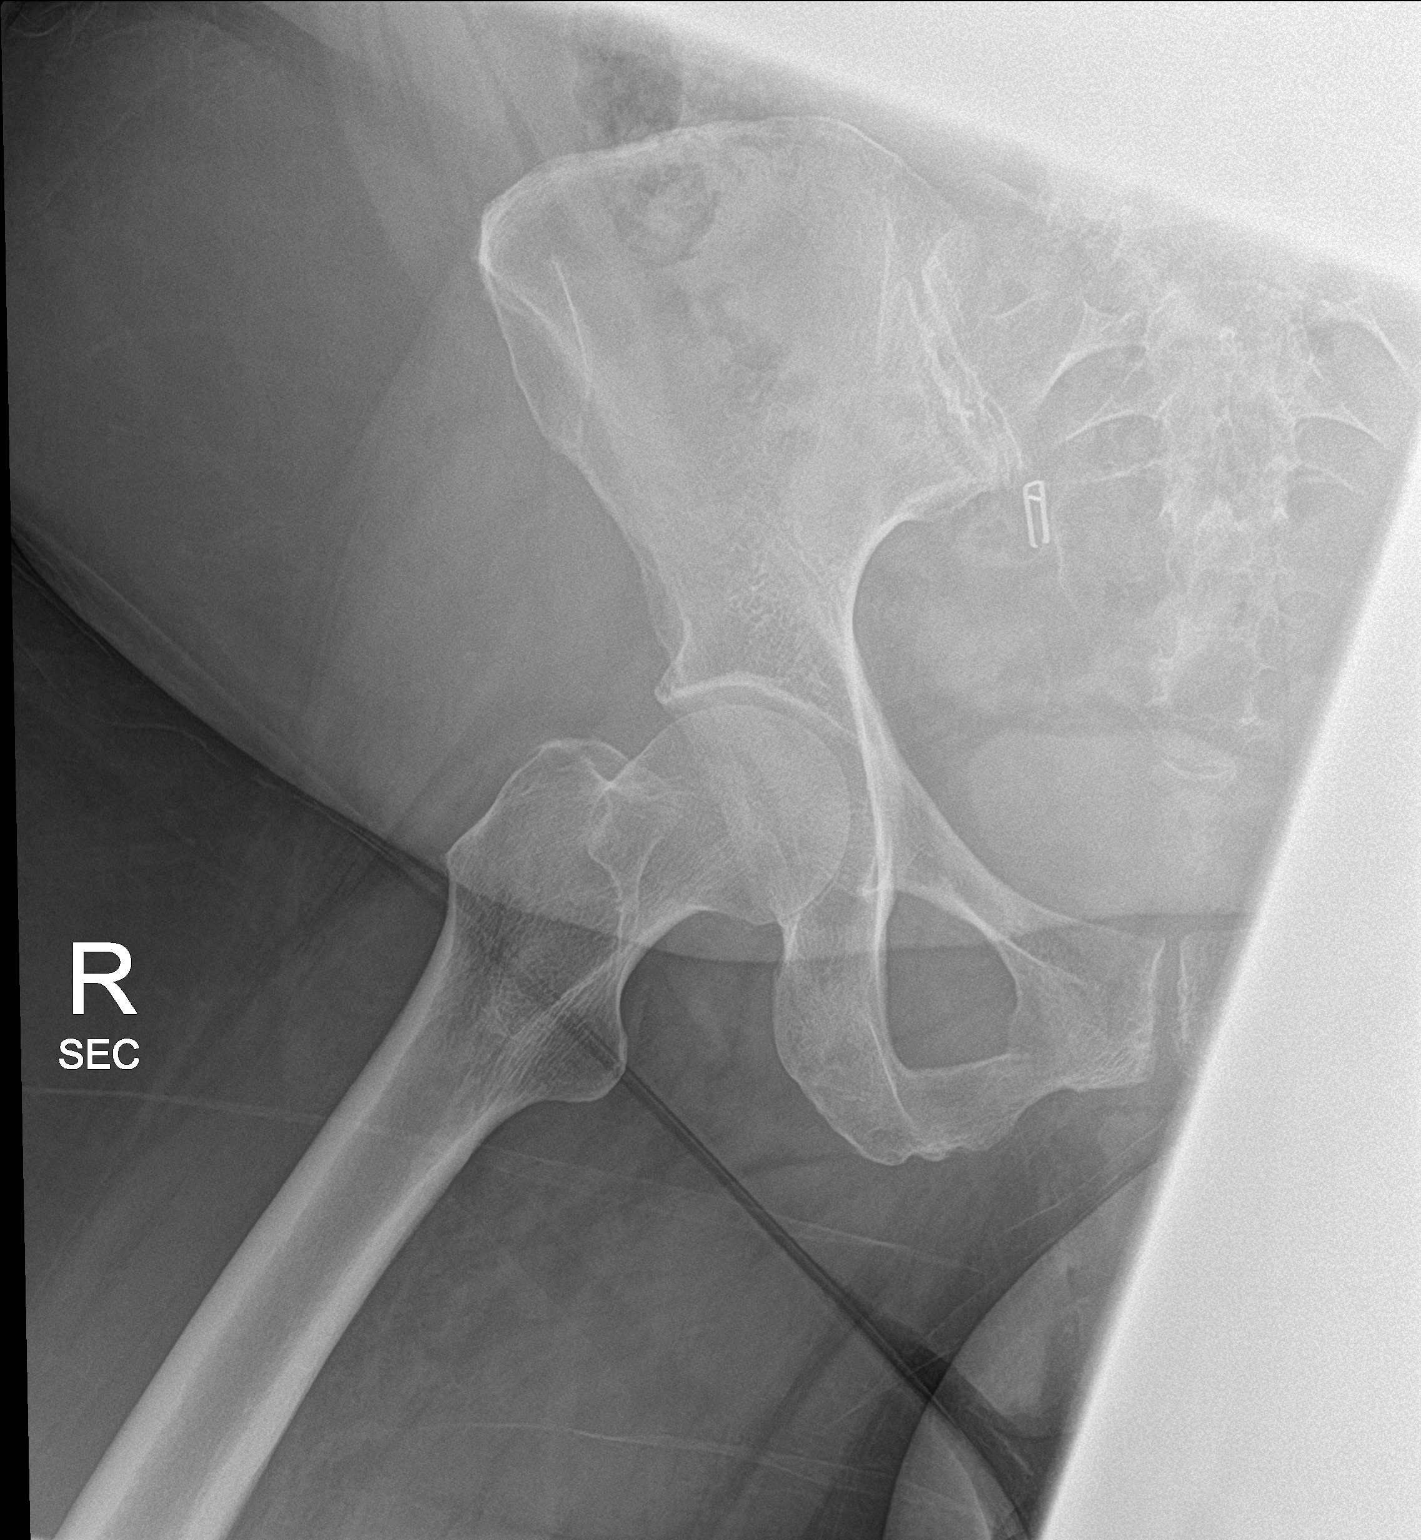

[pelvis ap]
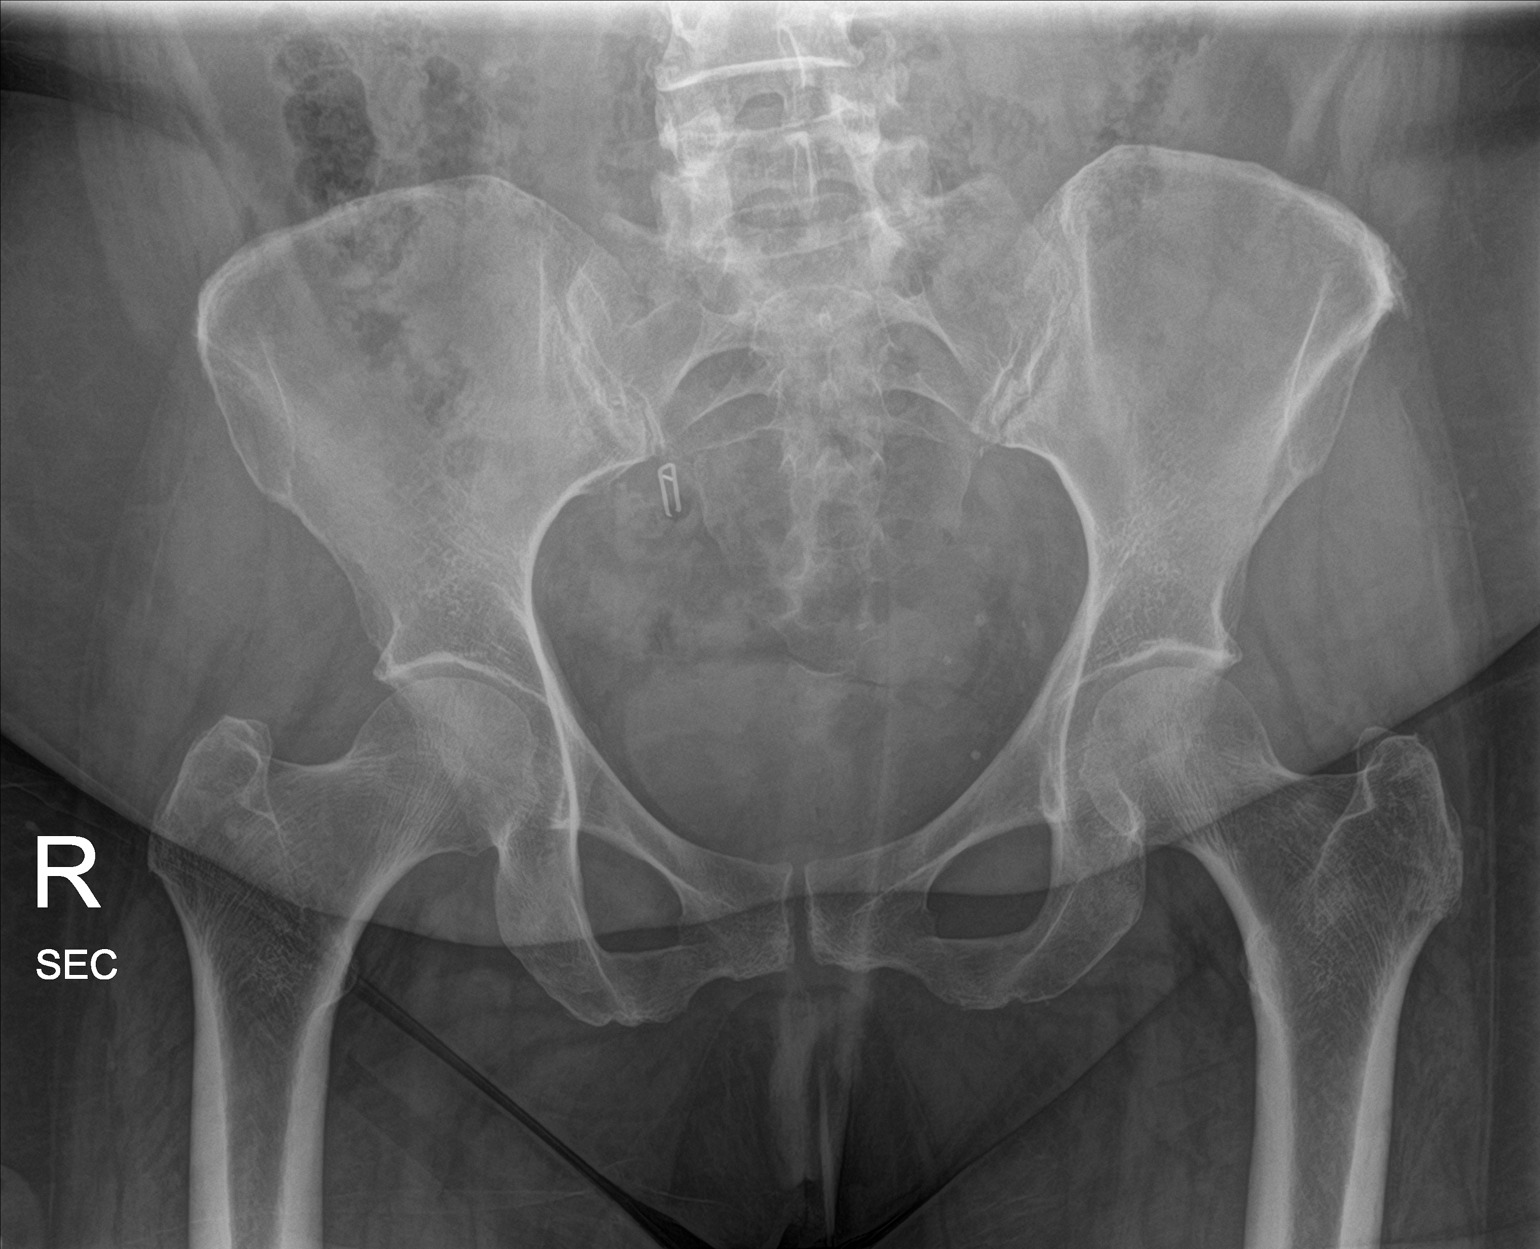

[3 of 3 positions shown; findings below may reference images not displayed]

FINDINGS: Mild osseous demineralization.

Hip and SI joint spaces symmetric and preserved.

No acute fracture, dislocation, or bone destruction.

Single surgical clip projects over pelvis.
IMPRESSION: No acute osseous abnormalities.

## 2018-10-29 ENCOUNTER — Other Ambulatory Visit: Payer: Self-pay | Admitting: Surgery

## 2018-10-30 IMAGING — CR DG LUMBAR SPINE COMPLETE 4+V
5 series · 5 of 5 positions shown · non-contrast
Comparison: 08/03/2016; 11/20/2015 ; CT abdomen pelvis - 04/19/2015

CLINICAL DATA: Low back pain with radiation to the right leg
beginning 3 days ago. No known injury.

EXAM:
LUMBAR SPINE - COMPLETE 4+ VIEW

[l-spine ap]
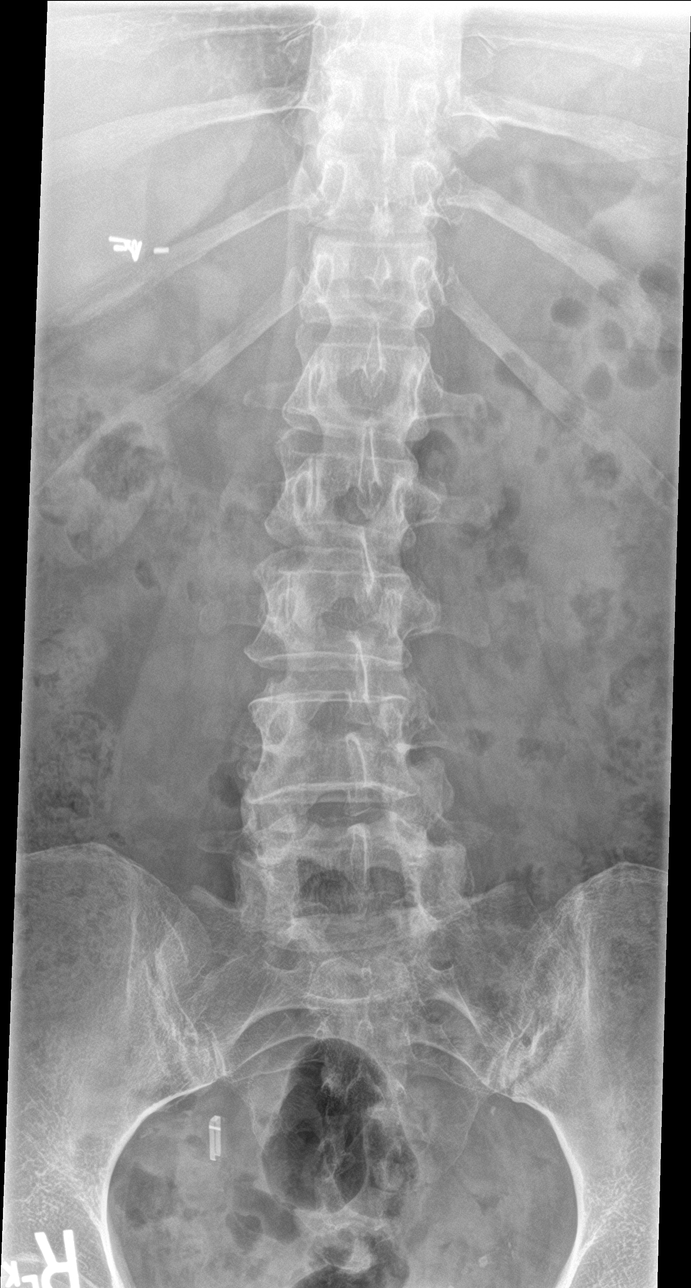

[l-spine obl (1 of 2)]
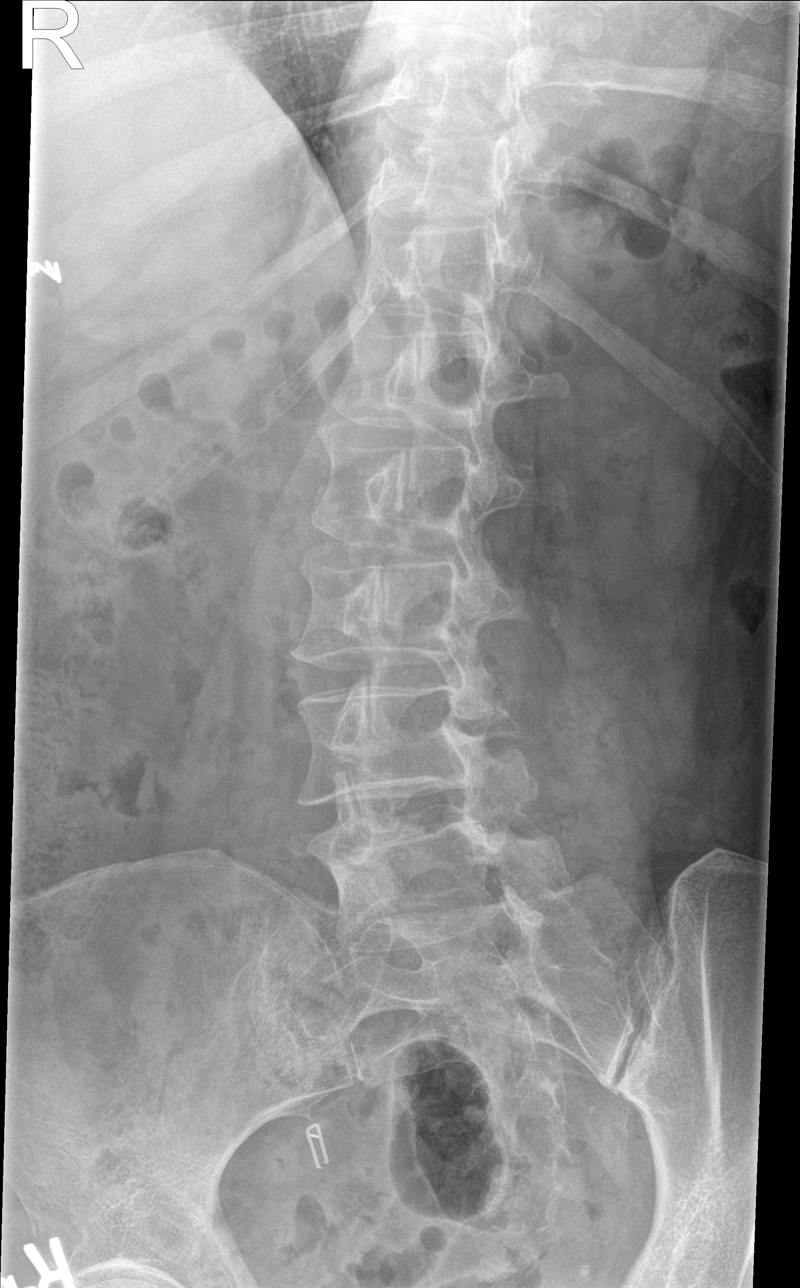

[l-spine obl (2 of 2)]
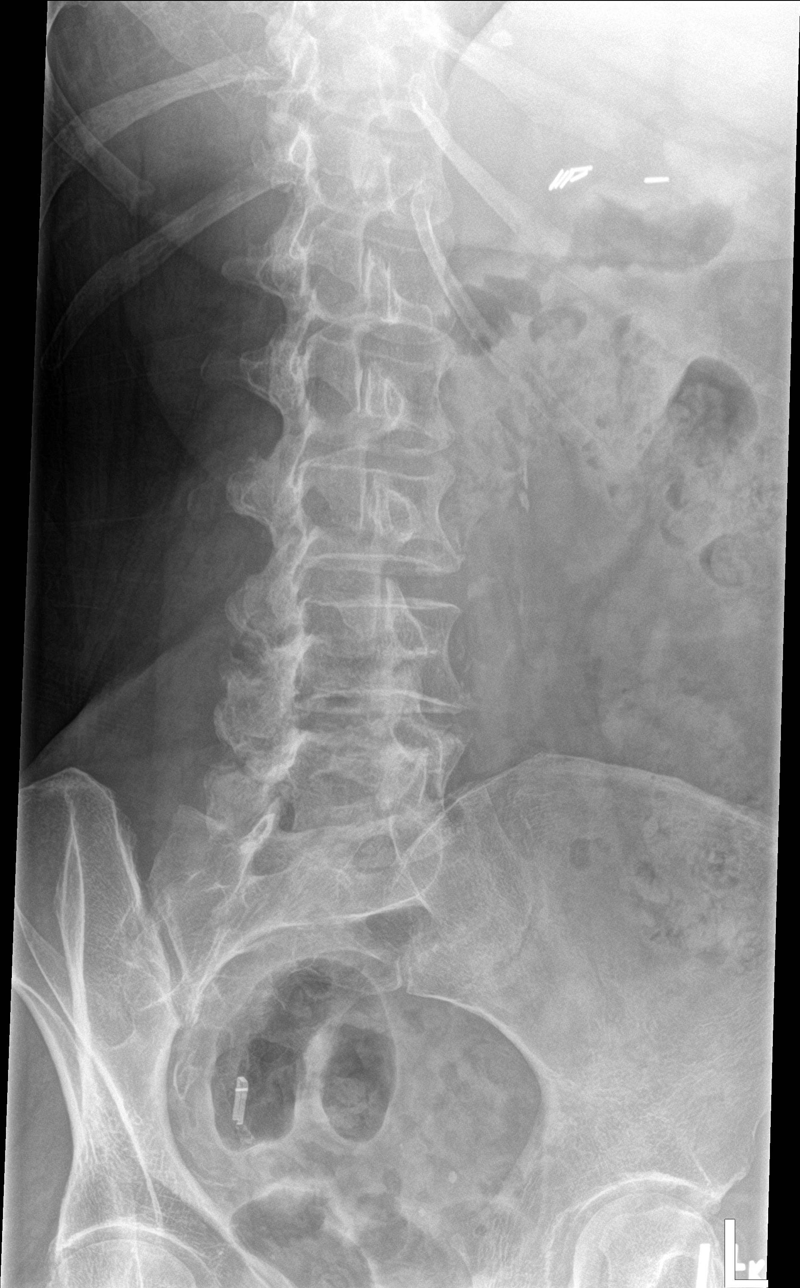

[l-spine lat]
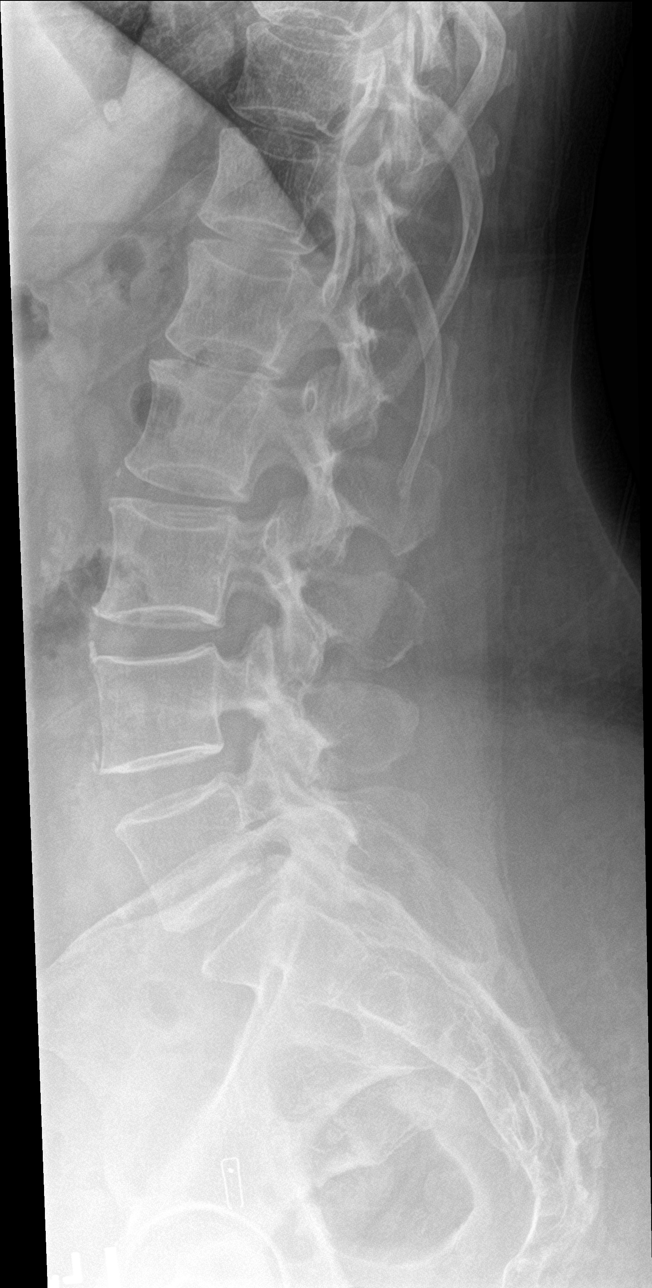

[l-spine spot]
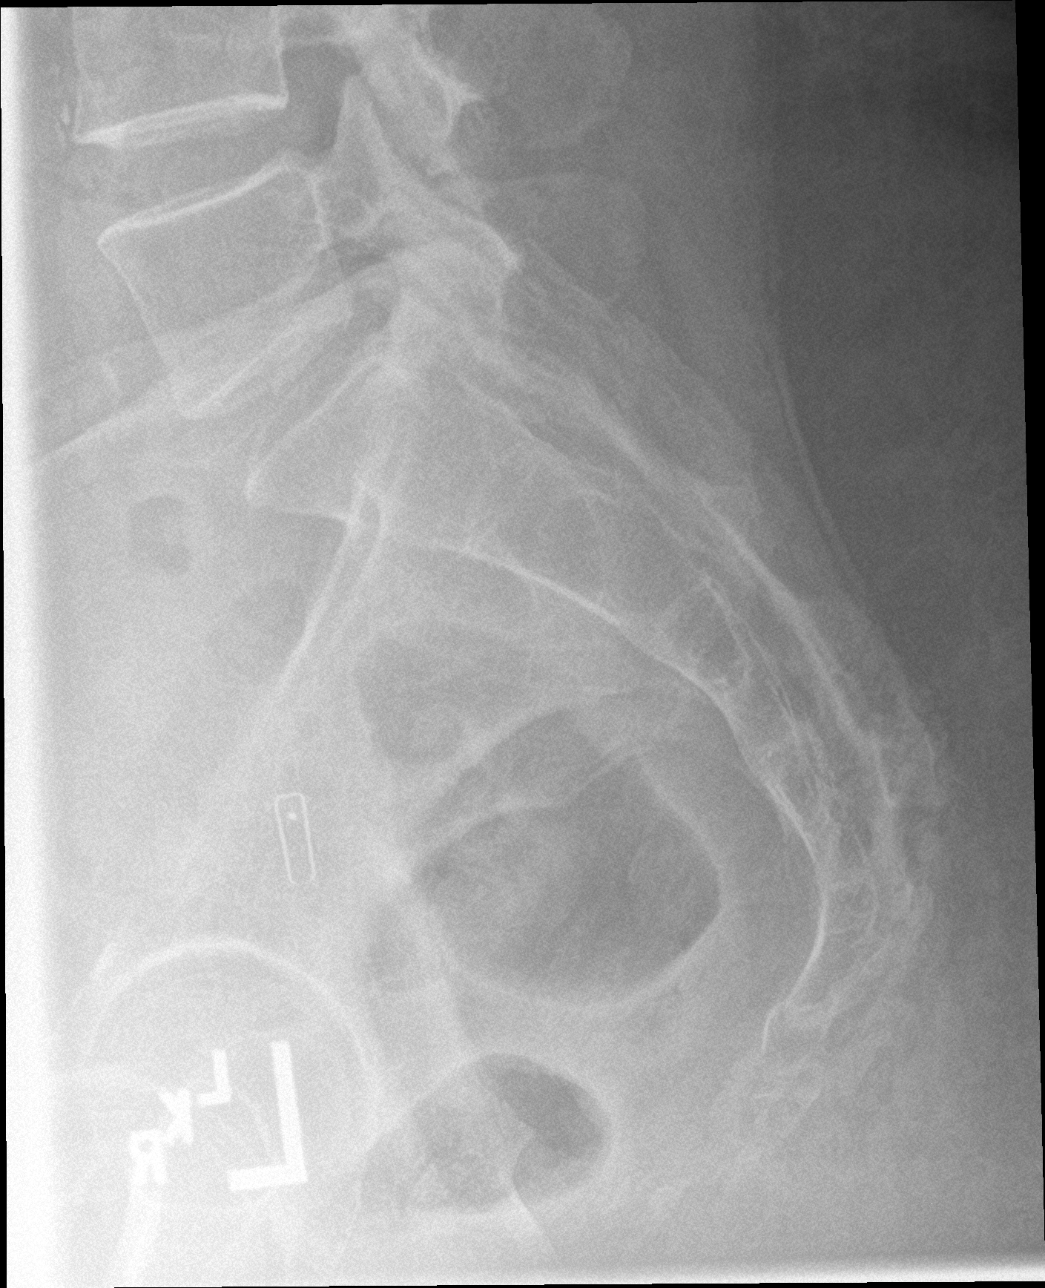

[5 of 5 positions shown; findings below may reference images not displayed]

FINDINGS: There are 5 non rib-bearing lumbar type vertebral bodies.

There is a mild scoliotic curvature of the thoracolumbar spine with
dominant caudal component convex the right measuring approximately 6
degrees (as measured from the superior endplate of L2 to the
inferior endplate of L4). No anterolisthesis or retrolisthesis. No
definite pars defects.

Lumbar vertebral body heights appear preserved.

Mild to moderate multilevel lumbar spine DDD, likely worse at L1-L2
with disc space height loss, endplate irregularity and sclerosis

Limited visualization of the bilateral SI joints is normal.

Post cholecystectomy. A tubal ligation clip versus a displaced
cholecystectomy clip overlies the right hemipelvis, unchanged.
Moderate colonic stool burden without evidence of enteric
obstruction.
IMPRESSION: 1. Stable examination without interval change.
2. Mild scoliotic curvature of the thoracolumbar spine with
associated mild to moderate multilevel lumbar spine DDD, likely
worse at L1-L2.

## 2018-11-06 ENCOUNTER — Ambulatory Visit: Payer: Medicaid Other | Admitting: Nurse Practitioner

## 2018-11-10 ENCOUNTER — Encounter: Payer: Self-pay | Admitting: Emergency Medicine

## 2018-11-10 ENCOUNTER — Emergency Department: Payer: Medicare Other

## 2018-11-10 ENCOUNTER — Emergency Department
Admission: EM | Admit: 2018-11-10 | Discharge: 2018-11-10 | Disposition: A | Discharge status: Home / Self Care | Payer: Medicare Other | Attending: Emergency Medicine | Admitting: Emergency Medicine

## 2018-11-10 DIAGNOSIS — F17210 Nicotine dependence, cigarettes, uncomplicated: Secondary | ICD-10-CM | POA: Diagnosis not present

## 2018-11-10 DIAGNOSIS — K529 Noninfective gastroenteritis and colitis, unspecified: Secondary | ICD-10-CM | POA: Diagnosis not present

## 2018-11-10 DIAGNOSIS — Z79899 Other long term (current) drug therapy: Secondary | ICD-10-CM | POA: Insufficient documentation

## 2018-11-10 DIAGNOSIS — Z85828 Personal history of other malignant neoplasm of skin: Secondary | ICD-10-CM | POA: Insufficient documentation

## 2018-11-10 DIAGNOSIS — R110 Nausea: Secondary | ICD-10-CM | POA: Diagnosis not present

## 2018-11-10 DIAGNOSIS — R197 Diarrhea, unspecified: Secondary | ICD-10-CM | POA: Insufficient documentation

## 2018-11-10 DIAGNOSIS — R109 Unspecified abdominal pain: Secondary | ICD-10-CM | POA: Diagnosis present

## 2018-11-10 DIAGNOSIS — Z96651 Presence of right artificial knee joint: Secondary | ICD-10-CM | POA: Insufficient documentation

## 2018-11-10 DIAGNOSIS — I509 Heart failure, unspecified: Secondary | ICD-10-CM | POA: Diagnosis not present

## 2018-11-10 DIAGNOSIS — Z7901 Long term (current) use of anticoagulants: Secondary | ICD-10-CM | POA: Insufficient documentation

## 2018-11-10 DIAGNOSIS — I110 Hypertensive heart disease with heart failure: Secondary | ICD-10-CM | POA: Insufficient documentation

## 2018-11-10 LAB — URINALYSIS, ROUTINE W REFLEX MICROSCOPIC
Bilirubin Urine: NEGATIVE
Glucose, UA: NEGATIVE mg/dL
Ketones, ur: NEGATIVE mg/dL
Leukocytes, UA: NEGATIVE
Nitrite: NEGATIVE
Protein, ur: NEGATIVE mg/dL
Specific Gravity, Urine: 1.003 — ABNORMAL LOW (ref 1.005–1.030)
pH: 6 (ref 5.0–8.0)

## 2018-11-10 LAB — COMPREHENSIVE METABOLIC PANEL
ALT: 11 U/L (ref 0–44)
AST: 11 U/L — ABNORMAL LOW (ref 15–41)
Albumin: 3.8 g/dL (ref 3.5–5.0)
Alkaline Phosphatase: 118 U/L (ref 38–126)
Anion gap: 9 (ref 5–15)
BUN: 10 mg/dL (ref 6–20)
CO2: 24 mmol/L (ref 22–32)
Calcium: 8.8 mg/dL — ABNORMAL LOW (ref 8.9–10.3)
Chloride: 104 mmol/L (ref 98–111)
Creatinine, Ser: 0.76 mg/dL (ref 0.44–1.00)
GFR calc Af Amer: >60 mL/min (ref >60)
GFR calc non Af Amer: >60 mL/min (ref >60)
Glucose, Bld: 171 mg/dL — ABNORMAL HIGH (ref 70–99)
Potassium: 3.1 mmol/L — ABNORMAL LOW (ref 3.5–5.1)
Sodium: 137 mmol/L (ref 135–145)
Total Bilirubin: 0.6 mg/dL (ref 0.3–1.2)
Total Protein: 7.6 g/dL (ref 6.5–8.1)

## 2018-11-10 LAB — CBC WITH DIFFERENTIAL/PLATELET
Abs Immature Granulocytes: 0.04 10*3/uL (ref 0.00–0.07)
Basophils Absolute: 0 10*3/uL (ref 0.0–0.1)
Basophils Relative: 0 %
Eosinophils Absolute: 0.1 10*3/uL (ref 0.0–0.5)
Eosinophils Relative: 1 %
HCT: 44.7 % (ref 36.0–46.0)
Hemoglobin: 14.1 g/dL (ref 12.0–15.0)
Immature Granulocytes: 0 %
Lymphocytes Relative: 22 %
Lymphs Abs: 2.2 10*3/uL (ref 0.7–4.0)
MCH: 28.5 pg (ref 26.0–34.0)
MCHC: 31.5 g/dL (ref 30.0–36.0)
MCV: 90.5 fL (ref 80.0–100.0)
Monocytes Absolute: 0.5 10*3/uL (ref 0.1–1.0)
Monocytes Relative: 5 %
Neutro Abs: 7.1 10*3/uL (ref 1.7–7.7)
Neutrophils Relative %: 72 %
Platelets: 290 10*3/uL (ref 150–400)
RBC: 4.94 MIL/uL (ref 3.87–5.11)
RDW: 14.4 % (ref 11.5–15.5)
WBC: 10 10*3/uL (ref 4.0–10.5)
nRBC: 0 % (ref 0.0–0.2)

## 2018-11-10 LAB — LIPASE, BLOOD: Lipase: 35 U/L (ref 11–51)

## 2018-11-10 MED ORDER — ONDANSETRON HCL 4 MG PO TABS: 4.0000 mg | ORAL_TABLET | Freq: Three times a day (TID) | ORAL | 0 refills | Status: DC | PRN

## 2018-11-10 MED ORDER — DICYCLOMINE HCL 20 MG PO TABS: 20.0000 mg | ORAL_TABLET | Freq: Three times a day (TID) | ORAL | 0 refills | Status: DC | PRN

## 2018-11-10 MED ORDER — IOHEXOL 300 MG/ML  SOLN
100.0000 mL | Freq: Once | INTRAMUSCULAR | Status: AC | PRN
  Administered 2018-11-10: 100 mL via INTRAVENOUS

## 2018-11-10 MED ORDER — DICYCLOMINE HCL 10 MG PO CAPS
20.0000 mg | ORAL_CAPSULE | Freq: Once | ORAL | Status: AC
  Administered 2018-11-10: 20 mg via ORAL
  Filled 2018-11-10: qty 2

## 2018-11-10 NOTE — ED Provider Notes (Signed)
[facility] Emergency Department Provider Note  ____________________________________________   I have reviewed the triage vital signs and the nursing notes.   HISTORY  Chief Complaint Abdominal pain  History limited by: Not Limited   HPI Carla [name] is a 58 y.o. female who presents to the emergency department today because of concerns for abdominal pain.  She states that the pain started last night.  It has been fairly constant and getting slightly worse in severity since then.  It is located in the center and right side of her abdomen.  She has had increased nausea but no vomiting.  She states she has had multiple episodes of diarrhea since the pain started.  She denies any unusual ingestions.  No known sick contacts.  She denies any fevers.  Per medical record review patient has a history of hysterectomy, SMA thrombus.   Past Medical History:  Diagnosis Date  . Abdominal aortic atherosclerosis ([place])    a. 05/2017 CTA abd/pelvis: significant atherosclerotic dzs of the infrarenal abd Ao w/ some mural thrombus. No aneurysm or dissection.  . Abdominal pain   . Acute focal ischemia of small intestine (HCC)   . Acute right-sided low back pain with right-sided sciatica 06/13/2017  . AKI (acute kidney injury) ([place]) 07/29/2017  . Baker's cyst of knee, right    a. 07/2016 U/S: 4.1 x 1.4 x 2.9 cystic structure in R poplitetal fossa.  . Bell's palsy   . Borderline diabetes   . Cancer ([place])    skin cancer on nose  . Chest pain    a. 08/2012 Lexiscan MV: EF 54%, non ischemia/infarct.  . CHF (congestive heart failure) ([place])   . Chronic idiopathic constipation   . Chronic mesenteric ischemia ([place])   . Diastolic dysfunction    a. 05/2017 Echo: Ef 60-65%, no rwma, Gr1 DD, no source of cardiac emboli.  . 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 05/2017 CTA Abd/pelvis: apparent thrombus or embolus in prox SMA (70-90%); b. 05/2017 catheter directed tPA, mechanical  thrombectomy, and stenting of the SMA.  . Essential hypertension with goal blood pressure less than 140/90 01/19/2016  . Gastroesophageal reflux disease 01/19/2016  . GERD (gastroesophageal reflux disease)   . History of kidney stones   . Hyperlipidemia   . Hypertension   . Hypotension 07/29/2017  . Morbid obesity with BMI of 40.0-44.9, adult ([place])   . Occlusive mesenteric ischemia ([place]) 06/19/2017  . Personal history of other malignant neoplasm of skin 06/01/2016  . Primary osteoarthritis of both knees 06/13/2017  . Recurrent major depressive disorder ([place]) 01/19/2016  . SBO (small bowel obstruction) ([place]) 08/06/2017  . 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 Urinary tract infection 01/09/2014    Patient Active Problem List   Diagnosis Date Noted  . Status post total knee replacement using cement, right 07/11/2018  . PAF (paroxysmal atrial fibrillation) ([place]) 06/15/2018  . Accelerated hypertension 04/08/2018  . Acute encephalopathy 04/08/2018  . Adjustment disorder with anxiety 08/15/2017  . Chronic idiopathic constipation   . Protein-calorie malnutrition, severe 08/08/2017  . SBO (small bowel obstruction) ([place]) 08/06/2017  . Chronic mesenteric ischemia ([place])   . AKI (acute kidney injury) ([place]) 07/29/2017  . Hypotension 07/29/2017  . 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 Abdominal pain   . Occlusive mesenteric ischemia ([place]) 06/19/2017  . 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 Acute focal ischemia of small intestine (HCC)   . Acute right-sided low  back pain with right-sided sciatica 06/13/2017  . Primary osteoarthritis of both knees 06/13/2017  . Pain syndrome, chronic 09/13/2016  . Morbid obesity with BMI of 40.0-44.9, adult ([place]) 07/16/2016  . Personal history of other malignant neoplasm of skin 06/01/2016  . Chronic pain of both knees 01/19/2016  . HTN (hypertension) 01/19/2016  . Gastroesophageal reflux disease 01/19/2016  . Hyperlipidemia  01/19/2016  . Prediabetes 01/19/2016  . Recurrent major depressive disorder ([place]) 01/19/2016  . Urinary tract infection 01/09/2014  . Chronic pelvic pain in female 11/14/2013  . Mixed stress and urge urinary incontinence 11/14/2013  . Chest pain 08/06/2012    Past Surgical History:  Procedure Laterality Date  . APPENDECTOMY    . CHOLECYSTECTOMY    . LAPAROTOMY N/A 08/08/2017   Procedure: EXPLORATORY LAPAROTOMY POSSIBLE BOWEL RESECTION;  Surgeon: Jules Husbands, MD;  Location: [facility] ORS;  Service: General;  Laterality: N/A;  . TEE WITHOUT CARDIOVERSION N/A 06/22/2017   Procedure: TRANSESOPHAGEAL ECHOCARDIOGRAM (TEE);  Surgeon: Wellington Hampshire, MD;  Location: [facility] ORS;  Service: Cardiovascular;  Laterality: N/A;  . TOTAL KNEE ARTHROPLASTY Right 07/11/2018   Procedure: TOTAL KNEE ARTHROPLASTY;  Surgeon: Corky Mull, MD;  Location: [facility] ORS;  Service: Orthopedics;  Laterality: Right;  [name] VAGINAL HYSTERECTOMY    . VISCERAL ARTERY INTERVENTION N/A 06/20/2017   Procedure: Visceral Artery Intervention, possible aortic thrombectomy;  Surgeon: Algernon Huxley, MD;  Location: [place] [facility];  Service: Cardiovascular;  Laterality: N/A;  . VISCERAL ARTERY INTERVENTION N/A 01/28/2018   Procedure: VISCERAL ARTERY INTERVENTION;  Surgeon: Algernon Huxley, MD;  Location: [place] [facility];  Service: Cardiovascular;  Laterality: N/A;    Prior to Admission medications   Medication Sig Start Date End Date Taking? Authorizing Provider  amLODipine (NORVASC) 5 MG tablet Take 1 tablet (5 mg total) by mouth daily. 04/10/18   Gladstone Lighter, MD  apixaban (ELIQUIS) 5 MG TABS tablet Take 5 mg by mouth 2 (two) times daily.    [provider]  atorvastatin (LIPITOR) 80 MG tablet Take 80 mg by mouth at bedtime.     [provider]  docusate sodium (COLACE) 100 MG capsule Take 1 capsule (100 mg total) by mouth 2 (two) times daily. 07/15/18   Duanne Guess, PA-C  DULoxetine (CYMBALTA) 30 MG  capsule Take 30 mg by mouth daily.  08/24/17   [provider]  ENSURE (ENSURE) Take 237 mLs by mouth 2 (two) times daily.    [provider]  ferrous sulfate 325 (65 FE) MG tablet Take 325 mg by mouth daily.    [provider]  meloxicam (MOBIC) 15 MG tablet Take 1 tablet (15 mg total) by mouth daily. 09/24/18   Cuthriell, Charline Bills, PA-C  metoprolol tartrate (LOPRESSOR) 50 MG tablet Take 1 tablet (50 mg total) by mouth 2 (two) times daily. 06/15/18   Bettey Costa, MD  omeprazole (PRILOSEC) 20 MG capsule Take 20 mg by mouth 2 (two) times daily before a meal.    [provider]  oxyCODONE (OXY IR/ROXICODONE) 5 MG immediate release tablet Take 1-2 tablets (5-10 mg total) by mouth every 4 (four) hours as needed for moderate pain. 07/12/18   Lattie Corns, PA-C  polyethylene glycol [facility] / Floria Raveling) packet Take 17 g by mouth 2 (two) times daily as needed.     [provider]    Allergies Patient has no known allergies.  Family History  Problem Relation Age of Onset  . Hypertension Mother   .  Heart disease Mother   . Heart attack Mother   . Breast cancer Mother   . Hypertension Father   . Breast cancer Paternal Aunt     Social History Social History   Tobacco Use  . Smoking status: Current Every Day Smoker    Packs/day: 0.50    Types: Cigarettes  . Smokeless tobacco: Never Used  Substance Use Topics  . Alcohol use: No  . Drug use: No    Review of Systems Constitutional: No fever/chills Eyes: No visual changes. ENT: No sore throat. Cardiovascular: Denies chest pain. Respiratory: Denies shortness of breath. Gastrointestinal: Positive for abdominal pain, nausea and diarrhea.  Genitourinary: Negative for dysuria. Musculoskeletal: Negative for back pain. Skin: Negative for rash. Neurological: Negative for headaches, focal weakness or numbness.  ____________________________________________   PHYSICAL EXAM:  VITAL SIGNS: ED  Triage Vitals  Enc Vitals Group     BP 11/10/18 1604 (!) 133/92     Pulse Rate 11/10/18 1604 (!) 124     Resp 11/10/18 1604 20     Temp 11/10/18 1604 98.2 F (36.8 C)     Temp Source 11/10/18 1604 Oral     SpO2 11/10/18 1604 99 %     Weight 11/10/18 1605 207 lb (93.9 kg)     Height 11/10/18 1605 5\' 2"  (1.575 m)     Head Circumference --      Peak Flow --      Pain Score 11/10/18 1605 7   Constitutional: Alert and oriented.  Eyes: Conjunctivae are normal.  ENT      Head: Normocephalic and atraumatic.      Nose: No congestion/rhinnorhea.      Mouth/Throat: Mucous membranes are moist.      Neck: No stridor. Hematological/Lymphatic/Immunilogical: No cervical lymphadenopathy. Cardiovascular: Normal rate, regular rhythm.  No murmurs, rubs, or gallops.  Respiratory: Normal respiratory effort without tachypnea nor retractions. Breath sounds are clear and equal bilaterally. No wheezes/rales/rhonchi. Gastrointestinal: Soft and somewhat diffusely tender to palpation. Genitourinary: Deferred Musculoskeletal: Normal range of motion in all extremities. No lower extremity edema. Neurologic:  Normal speech and language. No gross focal neurologic deficits are appreciated.  Skin:  Skin is warm, dry and intact. No rash noted. Psychiatric: Mood and affect are normal. Speech and behavior are normal. Patient exhibits appropriate insight and judgment.  ____________________________________________    LABS (pertinent positives/negatives)  Lipase 35 CMP na 137, k 3.1, glu 171, cr 0.76, ca 8.8 UA clear, moderate hgb dipstick, 0-5 rbc and wbc CBC wbc 10.0, hgb 14.1, plt 290 ____________________________________________   EKG  None  ____________________________________________    RADIOLOGY  CT abd/pel No acute abnormality  ____________________________________________   PROCEDURES  Procedures  ____________________________________________   INITIAL IMPRESSION / ASSESSMENT AND PLAN /  ED COURSE  Pertinent labs & imaging results that were available during my care of the patient were reviewed by me and considered in my medical decision making (see chart for details).   Patient presented to the emergency department today because of concerns for abdominal pain.  Patient was also complaining of nausea and diarrhea.  Patient was concern for possible hernia complication.  CT scan did not show any concerning findings around the hernia.  Given the patient's clinical exam and history I do think gastroenteritis likely.  I did discuss this with the patient.  Will plan on discharging with antinausea medication as well as Bentyl.   ____________________________________________   FINAL CLINICAL IMPRESSION(S) / ED DIAGNOSES  Final diagnoses:  Abdominal pain, unspecified  abdominal location  Gastroenteritis     Note: This dictation was prepared with Dragon dictation. Any transcriptional errors that result from this process are unintentional     Nance Pear, MD 11/10/18 1905

## 2018-11-10 NOTE — ED Triage Notes (Signed)
Pt to ED with c/o of increase pain at hernia site. Pt states increase overnight.

## 2018-11-10 NOTE — Discharge Instructions (Addendum)
Please seek medical attention for any high fevers, chest pain, shortness of breath, change in behavior, persistent vomiting, bloody stool or any other new or concerning symptoms.  

## 2018-12-02 IMAGING — CR DG CHEST 2V
2 series · 2 of 2 positions shown · non-contrast
Comparison: 03/24/2017 and prior radiographs

CLINICAL DATA: Acute shortness of breath.

EXAM:
CHEST  2 VIEW

[chest pa]
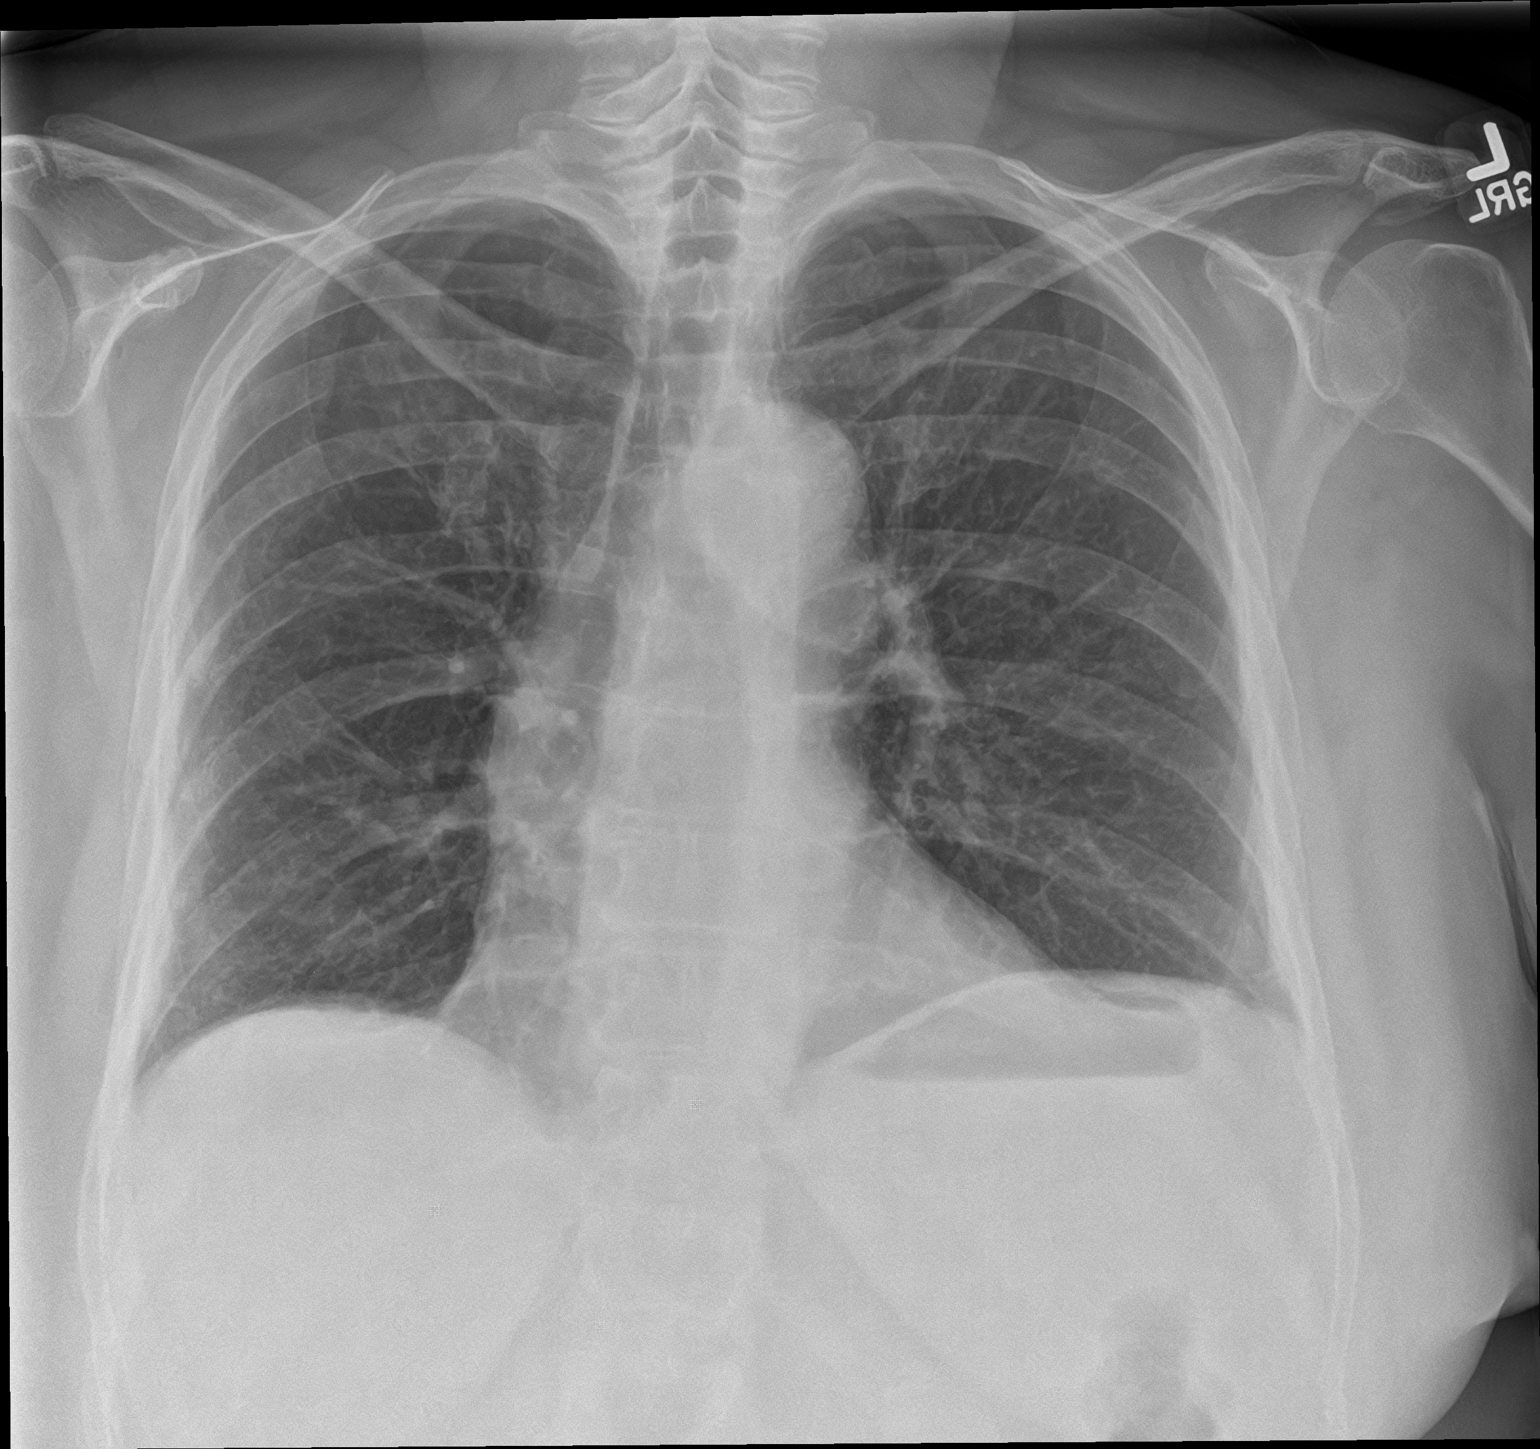

[chest lat]
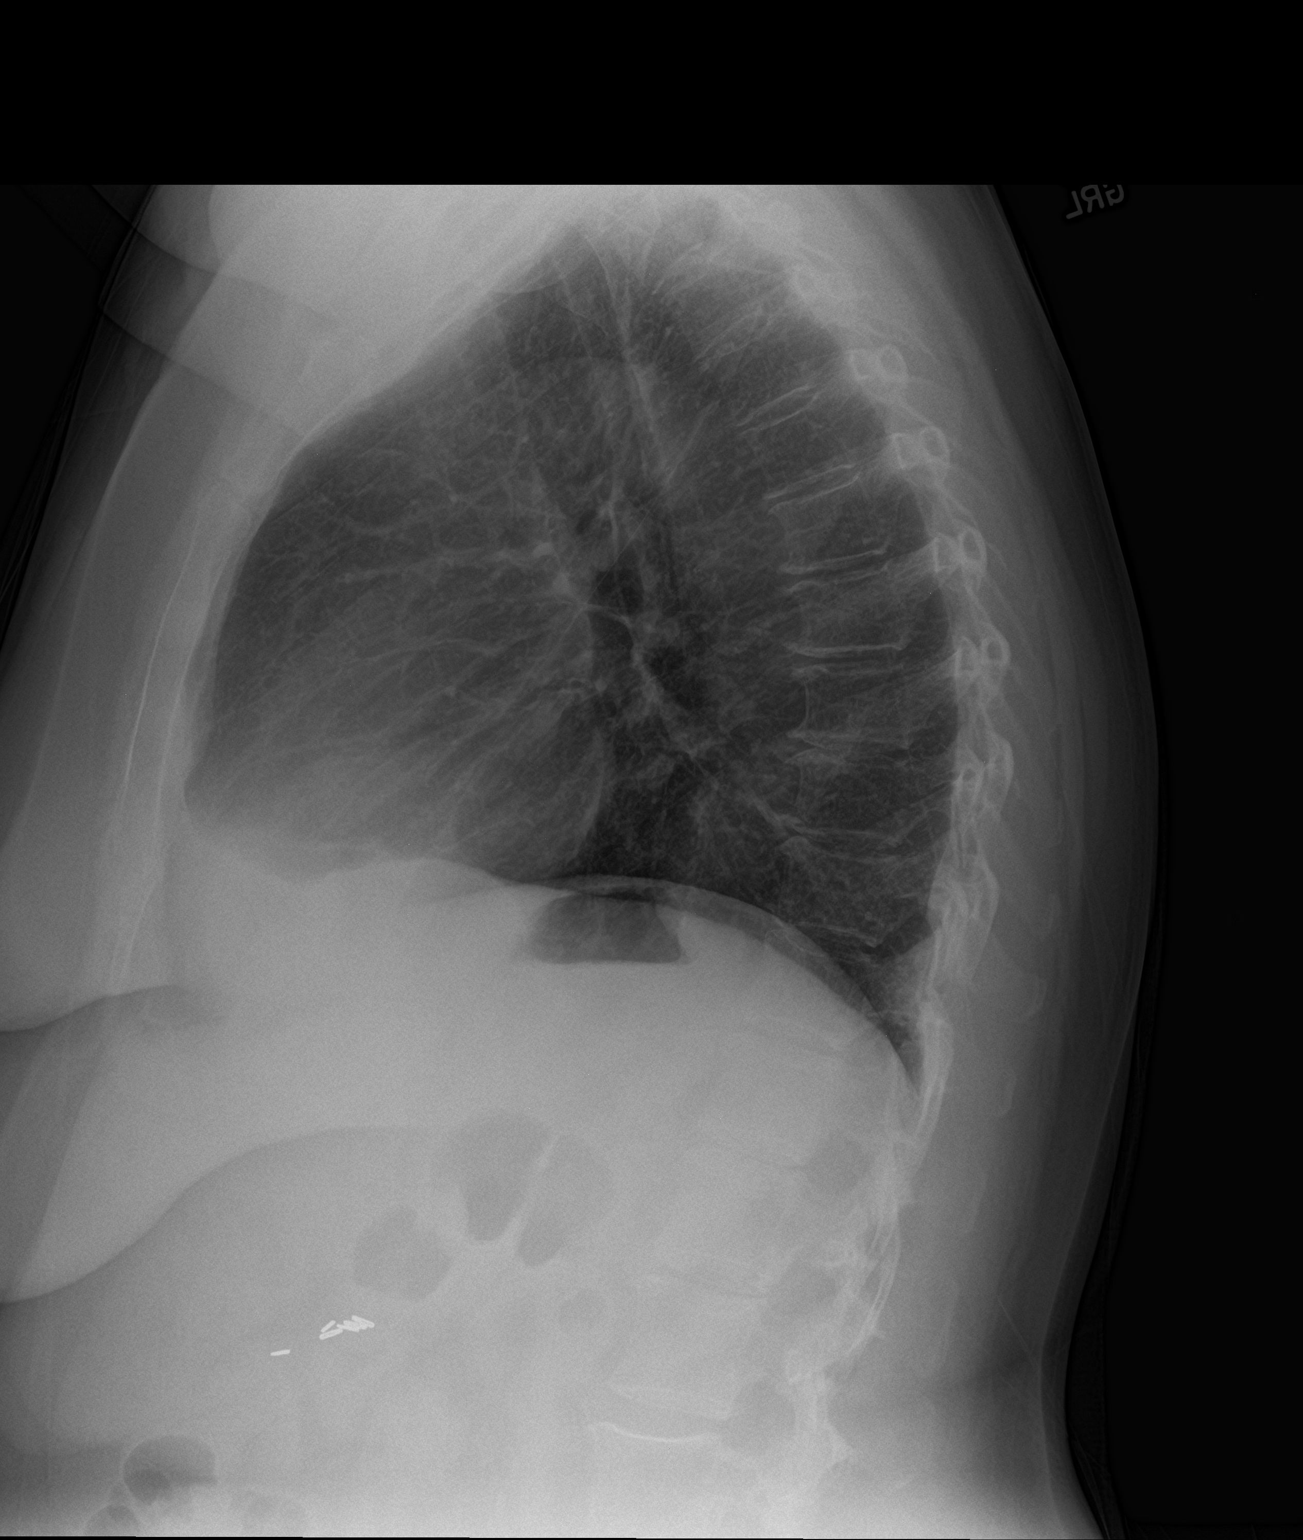

[2 of 2 positions shown; findings below may reference images not displayed]

FINDINGS: The cardiomediastinal silhouette is unremarkable.

There is no evidence of focal airspace disease, pulmonary edema,
suspicious pulmonary nodule/mass, pleural effusion, or pneumothorax.
No acute bony abnormalities are identified.
IMPRESSION: No active cardiopulmonary disease.

## 2018-12-03 IMAGING — CT CT CTA ABD/PEL W/CM AND/OR W/O CM
2 of 7 series · 13 of 46 positions shown, 15 images · IV contrast (APPLIED)
Comparison: Abdomen and pelvis CT, 04/09/2015

CLINICAL DATA: Pt in with co left sternal chest pain x 2 days no
cardiac history. States has had some shob and nausea with pain.

EXAM:
CT ANGIOGRAPHY CHEST, ABDOMEN AND PELVIS
TECHNIQUE: Multidetector CT imaging through the chest, abdomen and pelvis was
performed using the standard protocol during bolus administration of
intravenous contrast. Multiplanar reconstructed images and MIPs were
obtained and reviewed to evaluate the vascular anatomy.
CONTRAST:  125 mL of Isovue 370 intravenous contrast

[Series 6: axial arterial · axial · arterial · 0.86mm/px · z∈[-274,+266]mm · 10 of 211 slices shown, 12 images]
[im 21/211  soft-tissue]
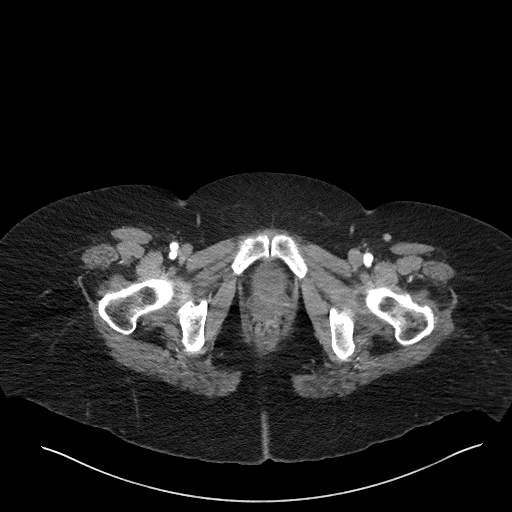
[im 21/211  bone]
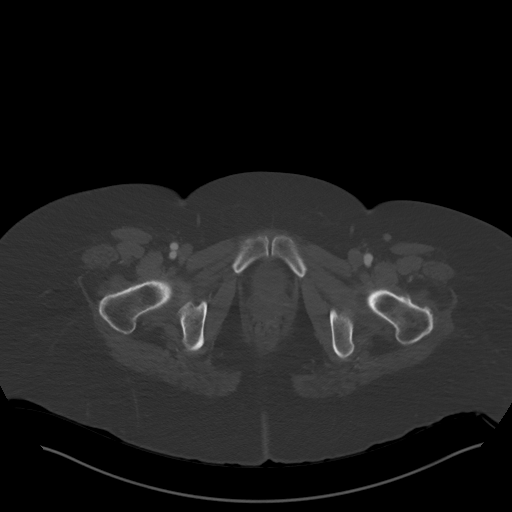
[im 41/211  soft-tissue]
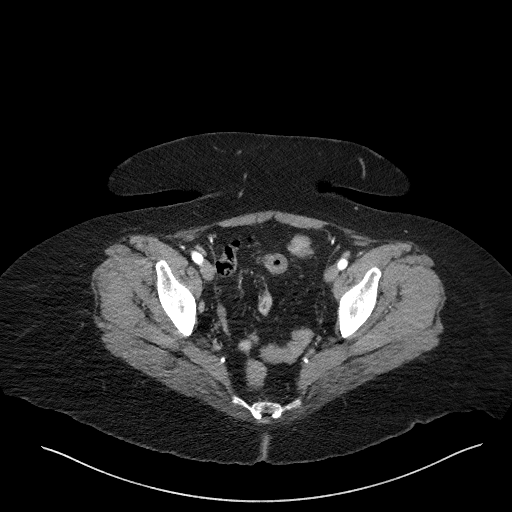
[im 61/211  soft-tissue]
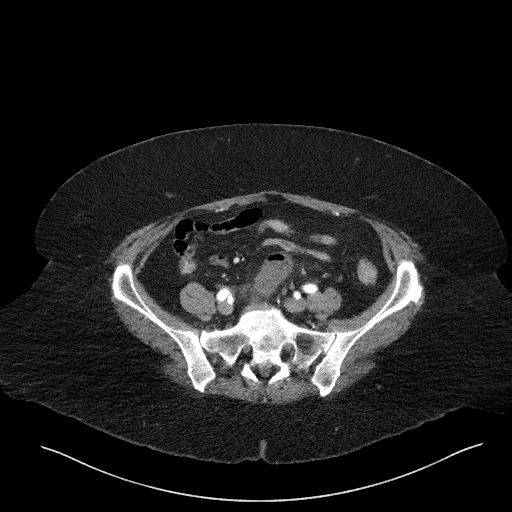
[im 81/211  soft-tissue]
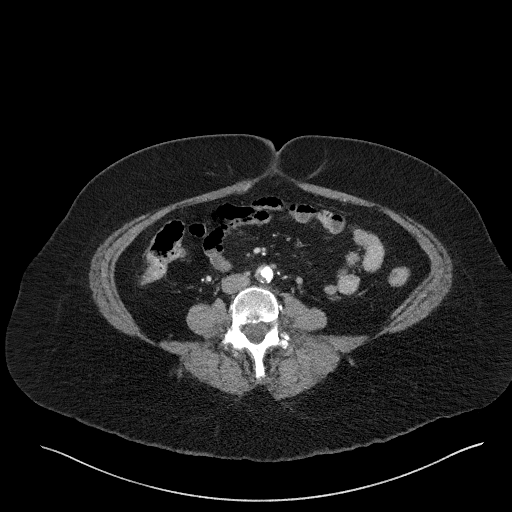
[im 101/211  soft-tissue]
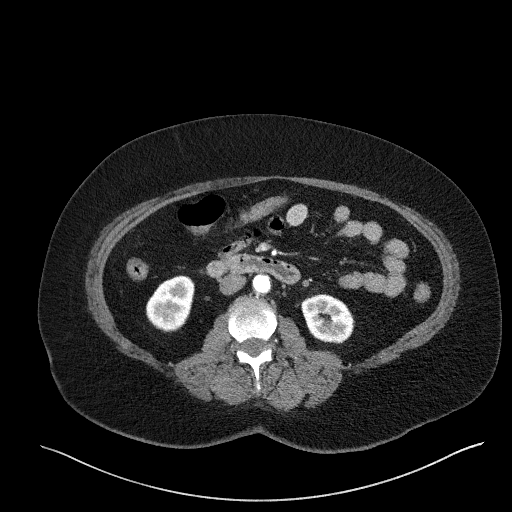
[im 121/211  soft-tissue]
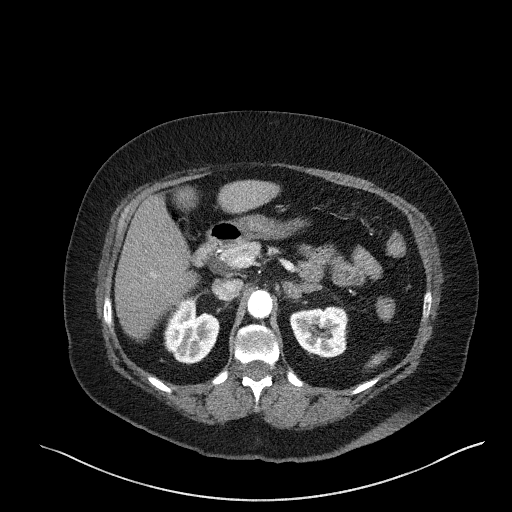
[im 141/211  soft-tissue]
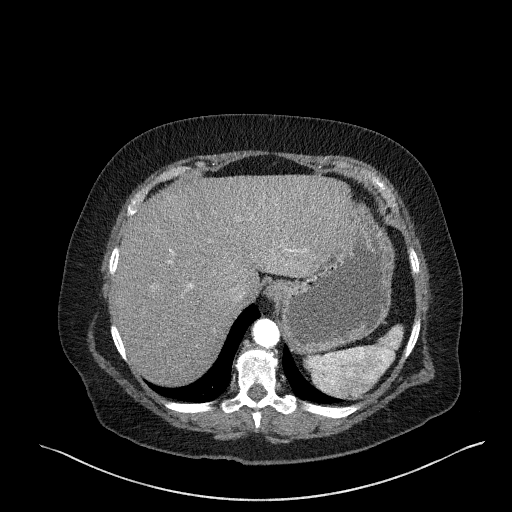
[im 161/211  soft-tissue]
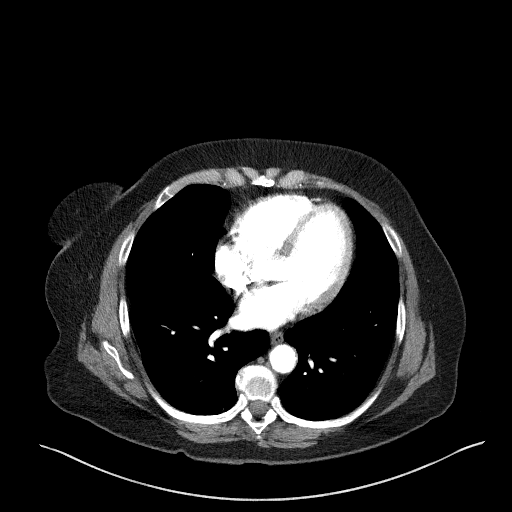
[im 181/211  soft-tissue]
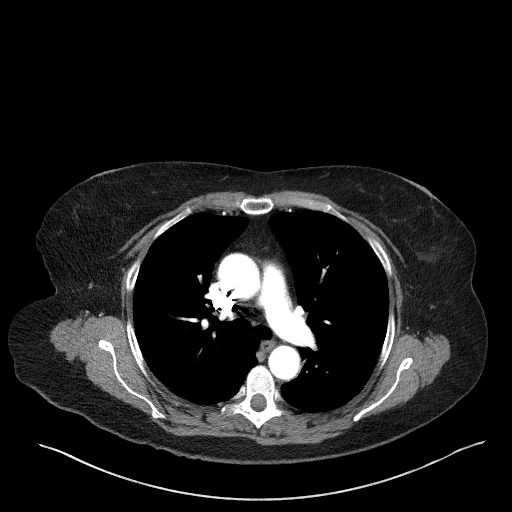
[im 181/211  bone]
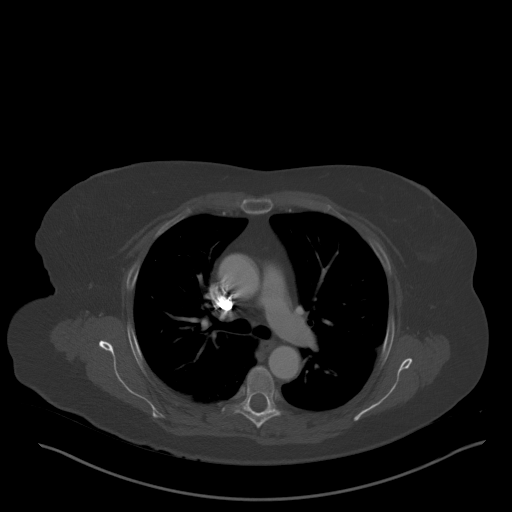
[im 201/211  soft-tissue]
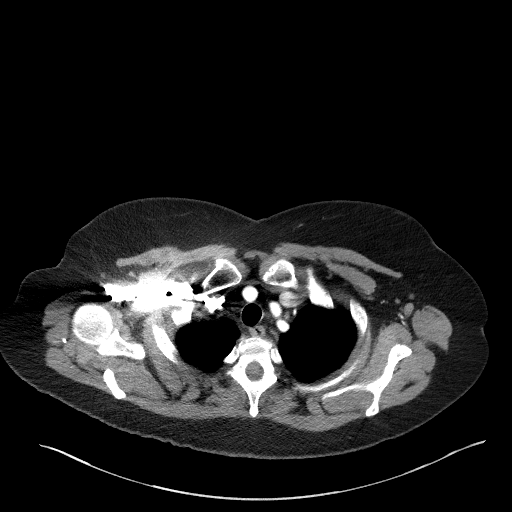

[Series 8: coronals · coronal · 0.87mm/px · 3 of 140 slices shown]
[im 35/140  soft-tissue]
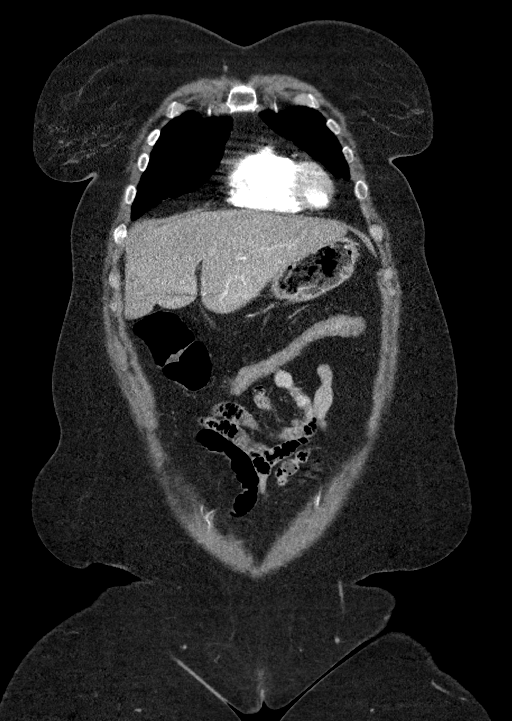
[im 70/140  soft-tissue]
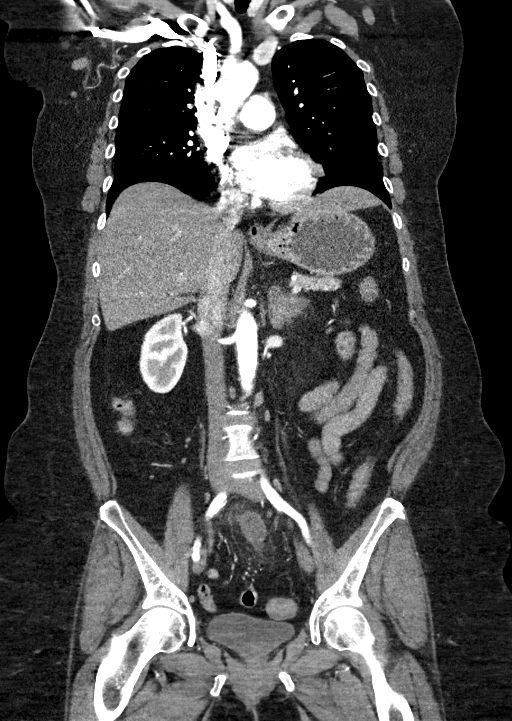
[im 105/140  soft-tissue]
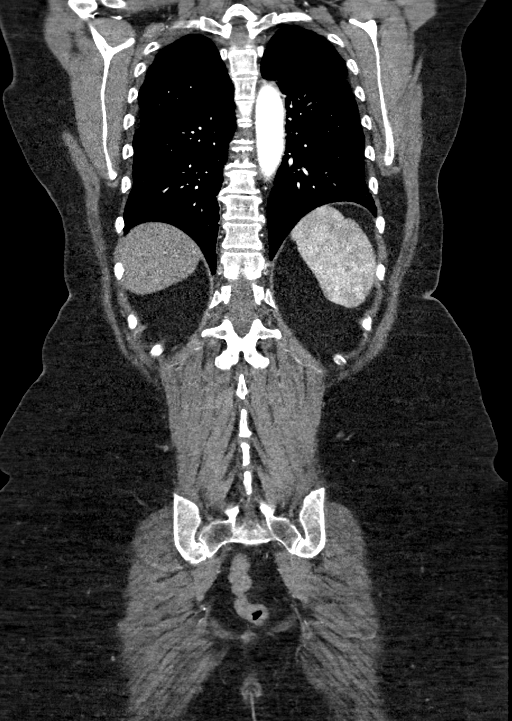

[13 of 46 positions shown; findings below may reference images not displayed]

FINDINGS: CTA CHEST FINDINGS

Cardiovascular: Heart normal in size and configuration. Mild
coronary artery calcifications. Thoracic aorta is normal in caliber.
There are minor aortic atherosclerotic calcifications along the
arch. Great vessels are normal in caliber. No aortic dissection.
Aortic arch branch vessels are widely patent.

Mediastinum/Nodes: No neck base, axillary, mediastinal or hilar
masses or adenopathy. Trachea is widely patent. Esophagus is
unremarkable. Normal thyroid.

Lungs/Pleura: Lungs are clear. No pleural effusion or pneumothorax.

Musculoskeletal: No fracture or acute finding. No osteoblastic or
osteolytic lesions.

Review of the MIP images confirms the above findings.

CTA ABDOMEN AND PELVIS FINDINGS

VASCULAR

Aorta: Atherosclerotic disease is noted throughout the abdominal
aorta, mostly the infrarenal portion. There is eccentric plaque with
a component of thrombus along the right aspect of the infrarenal
aorta extending to the bifurcation. No significant narrowing of the
abdominal aorta. No aneurysm. No dissection.

Celiac: Patent without evidence of aneurysm, dissection, vasculitis
or significant stenosis.

SMA: There is low attenuation material extending for 2.2 cm from the
origin of the superior mesenteric artery distally. This causes
significant narrowing of the vessel, 70-90%. This is likely
thrombus. Distal to this, the vessel is widely patent as are its
branch vessels.

Renals: Single right and 2 left renal arteries are widely patent.

IMA: Some plaque is evident at the origin of the inferior mesenteric
artery with at least mild narrowing. The vessel is patent.

Inflow: Mild atherosclerotic disease noted along the common iliac
arteries without significant stenosis. External iliac vessels and
common femoral arteries are widely patent.

Veins: No obvious venous abnormality within the limitations of this
arterial phase study.

Review of the MIP images confirms the above findings.

NON-VASCULAR

Hepatobiliary: Liver is unremarkable. Gallbladder surgically absent.
Common bile duct dilated 9 mm with distal tapering.

Pancreas: Unremarkable. No pancreatic ductal dilatation or
surrounding inflammatory changes.

Spleen: Normal in size without focal abnormality.

Adrenals/Urinary Tract: There is nodular thickening throughout the
left adrenal gland most likely nodular hyperplasia. Multiple small
adenomas are possible. Right adrenal gland is unremarkable.

Kidneys are normal in size, orientation and position. There is
symmetric enhancement. Mild bilateral renal cortical thinning. No
renal masses or stones. No hydronephrosis. Normal ureters. Bladder
is unremarkable.

Stomach/Bowel: There is a single loop of small bowel in the central
to upper pelvis with wall thickening and adjacent hazy inflammation.
There is no pneumatosis intestinalis. Remainder of the small bowel
is normal in caliber with normal wall thickness and no other
associated mesenteric inflammation. Stomach and colon are
unremarkable. Appendix not visualized.

Lymphatic: No adenopathy.

Reproductive: Status post hysterectomy. No adnexal masses.

Other: No abdominal wall hernia.  No ascites.

Musculoskeletal: No fracture or acute finding. No osteoblastic or
osteolytic lesions.

Review of the MIP images confirms the above findings.
IMPRESSION: CTA CHEST

1. No evidence of an aortic dissection.
2. No acute findings.
3. Mild coronary artery calcifications.
CTA ABDOMEN AND PELVIS

1. There is an apparent thrombus or embolus in the proximal superior
mesenteric artery causing severe, 70-90%, narrowing.
2. A short segment of small bowel shows wall thickening and adjacent
inflammation. This lies in the pelvis. Given the findings in the
superior mesenteric artery, focal small bowel ischemia is suspected.
This could, alternatively, be inflammatory or infectious in
etiology. No pneumatosis intestinalis or portal venous air.
3. No other significant vascular stenosis.
4. Significant atherosclerotic disease is noted of the infrarenal
abdominal aorta associated with some mural thrombus. No aneurysm or
dissection.
Critical Value/emergent results were called by telephone at the time
of interpretation on 06/19/2017 at [DATE] to Dr. CARSEN BUCKLER ,
who verbally acknowledged these results.

## 2018-12-03 IMAGING — CR DG CHEST 2V
2 series · 2 of 2 positions shown · non-contrast
Comparison: June 18, 2017

CLINICAL DATA: Left chest pain for 3 days

EXAM:
CHEST  2 VIEW

[chest pa]
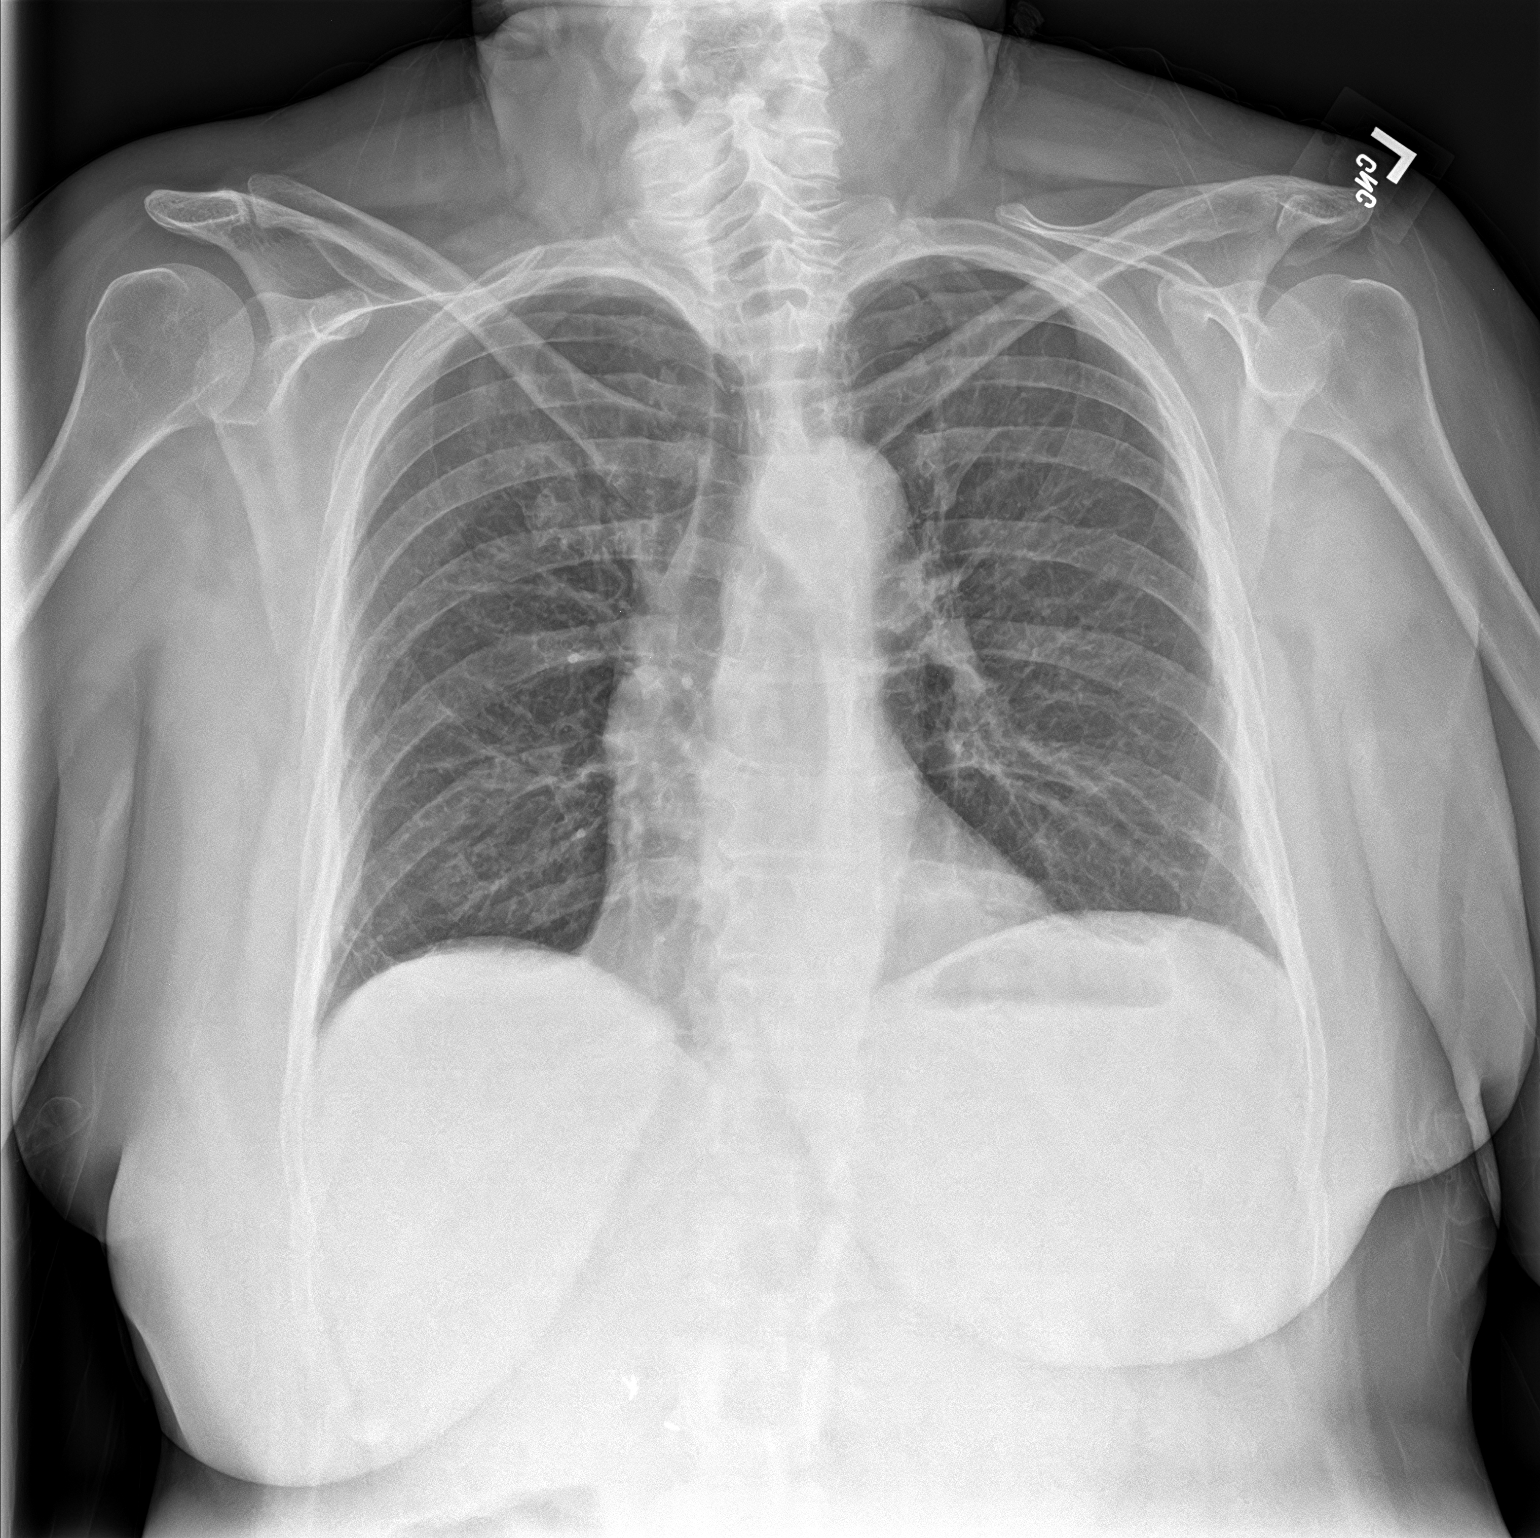

[chest lat]
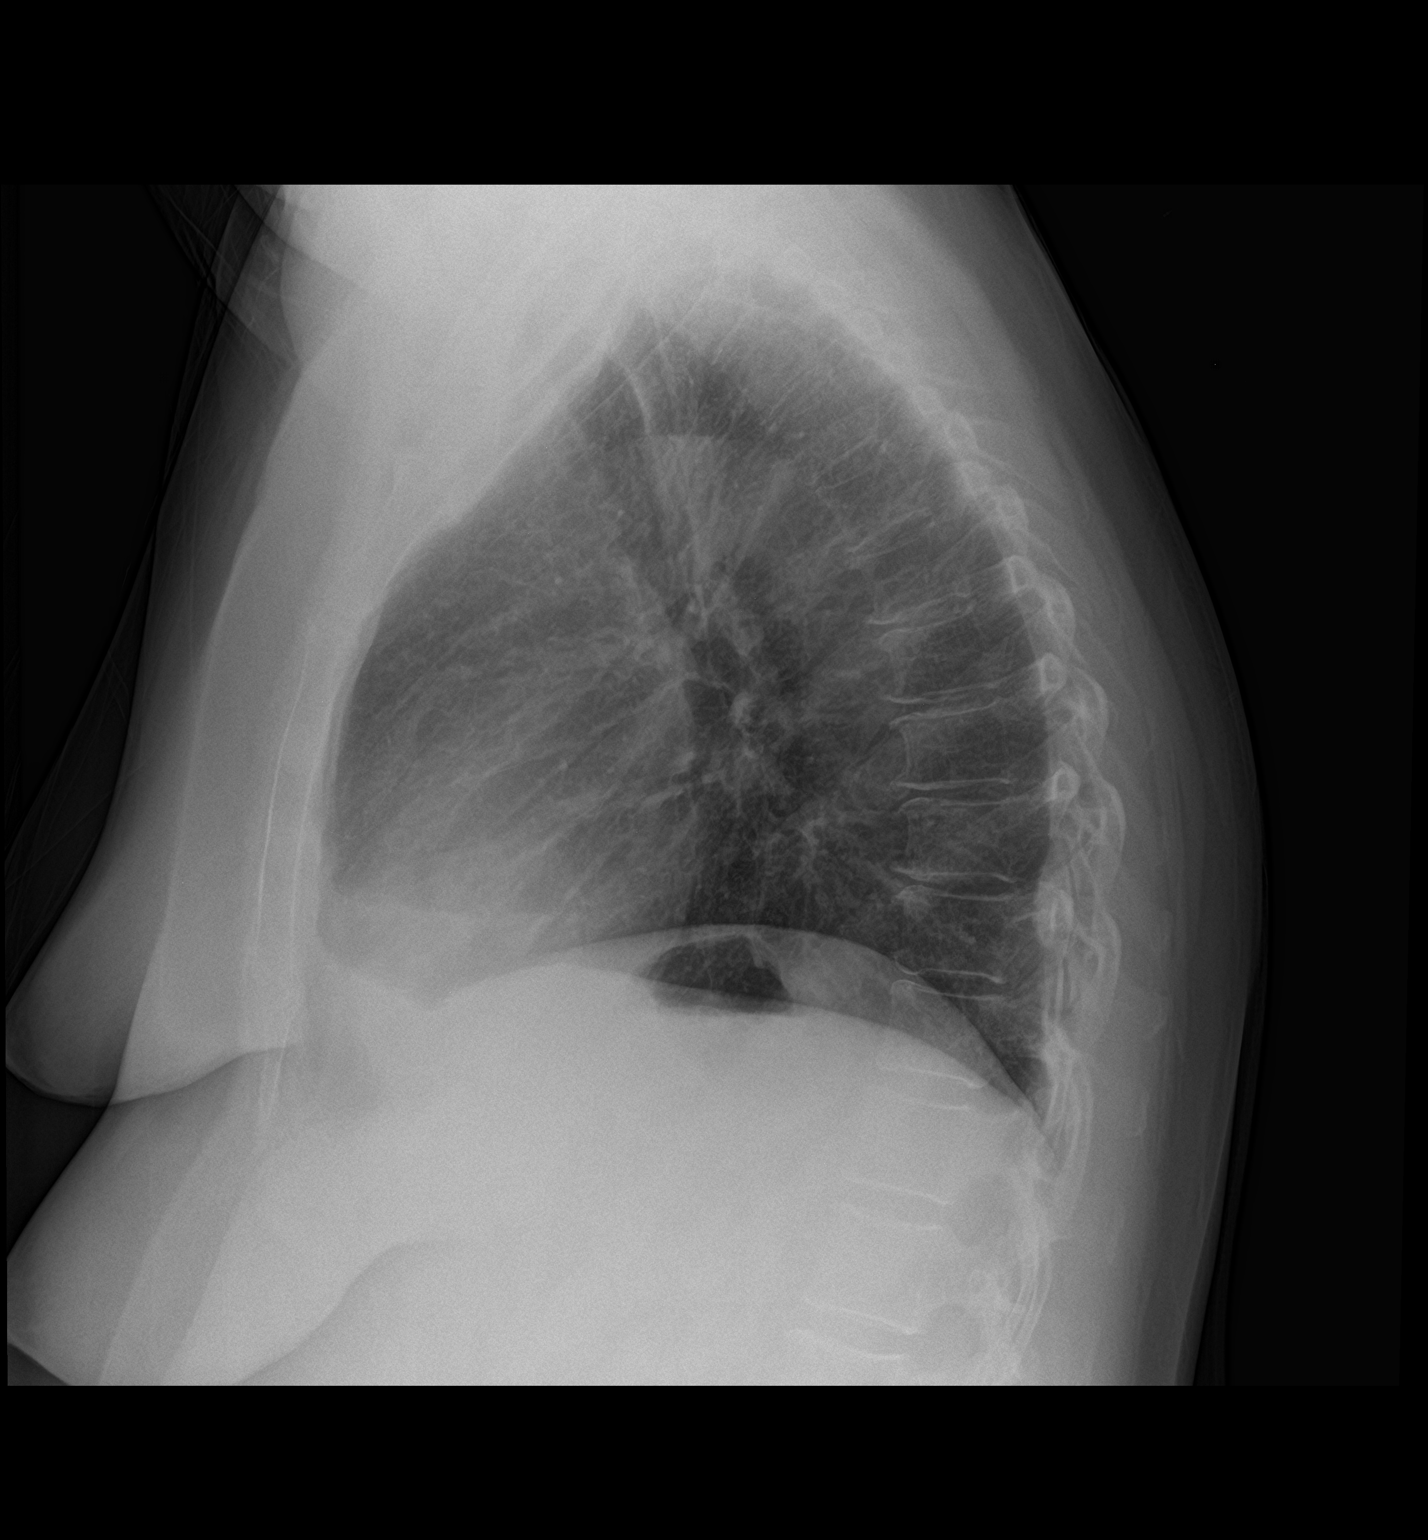

[2 of 2 positions shown; findings below may reference images not displayed]

FINDINGS: The heart size and mediastinal contours are within normal limits.
There is no focal infiltrate, pulmonary edema, or pleural effusion.
Degenerative joint changes of the spine are noted. Prior
cholecystectomy clips are noted.
IMPRESSION: No active cardiopulmonary disease.

## 2018-12-19 ENCOUNTER — Ambulatory Visit: Payer: Medicaid Other | Admitting: Nurse Practitioner

## 2018-12-26 ENCOUNTER — Ambulatory Visit: Payer: Medicaid Other | Admitting: Nurse Practitioner

## 2019-01-12 IMAGING — CR DG ABDOMEN ACUTE W/ 1V CHEST
1 series · 4 of 4 positions shown · non-contrast
Comparison: CT abdomen pelvis and chest x-ray dated June 19, 2017.

CLINICAL DATA: Abdominal pain.

EXAM:
DG ABDOMEN ACUTE W/ 1V CHEST

[Series 1: dg abd acute w/chest · 0.14mm/px · 4 of 4 slices shown]
[im 1/4]
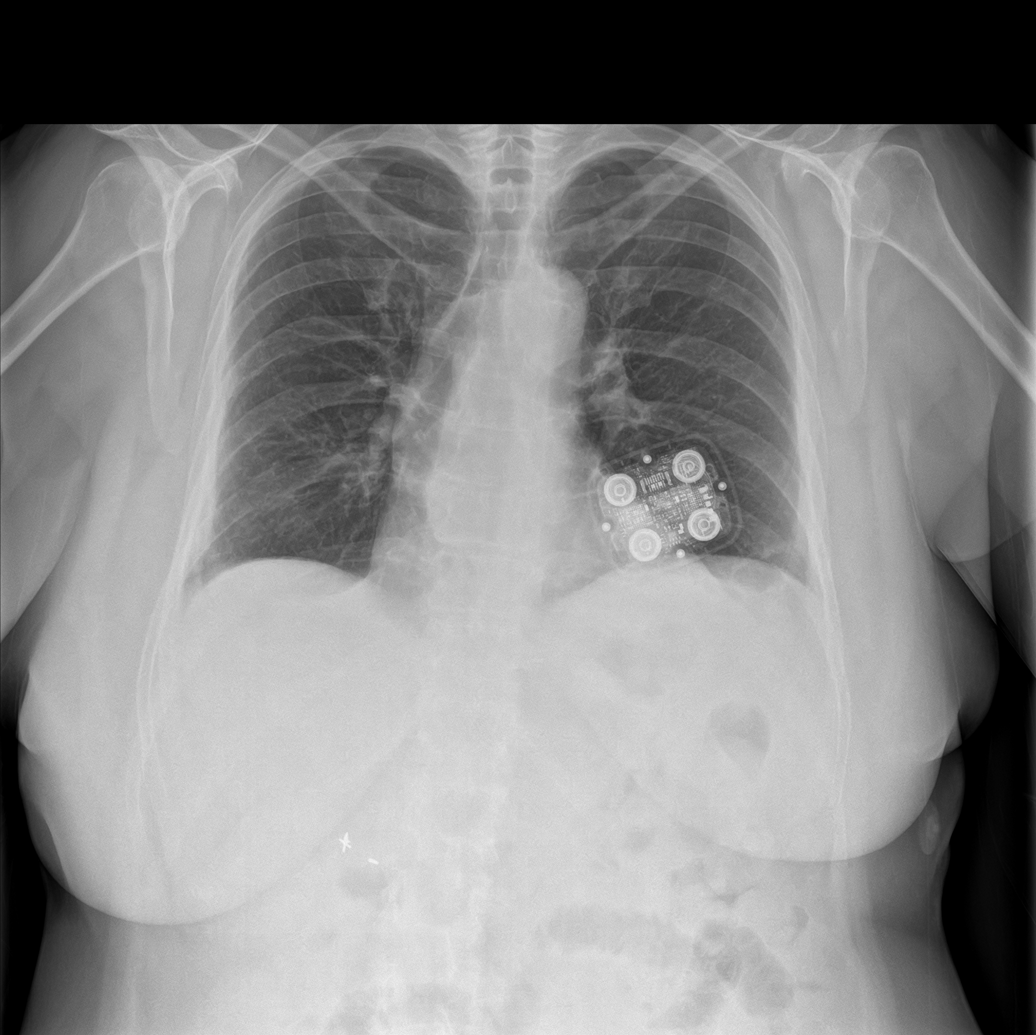
[im 2/4]
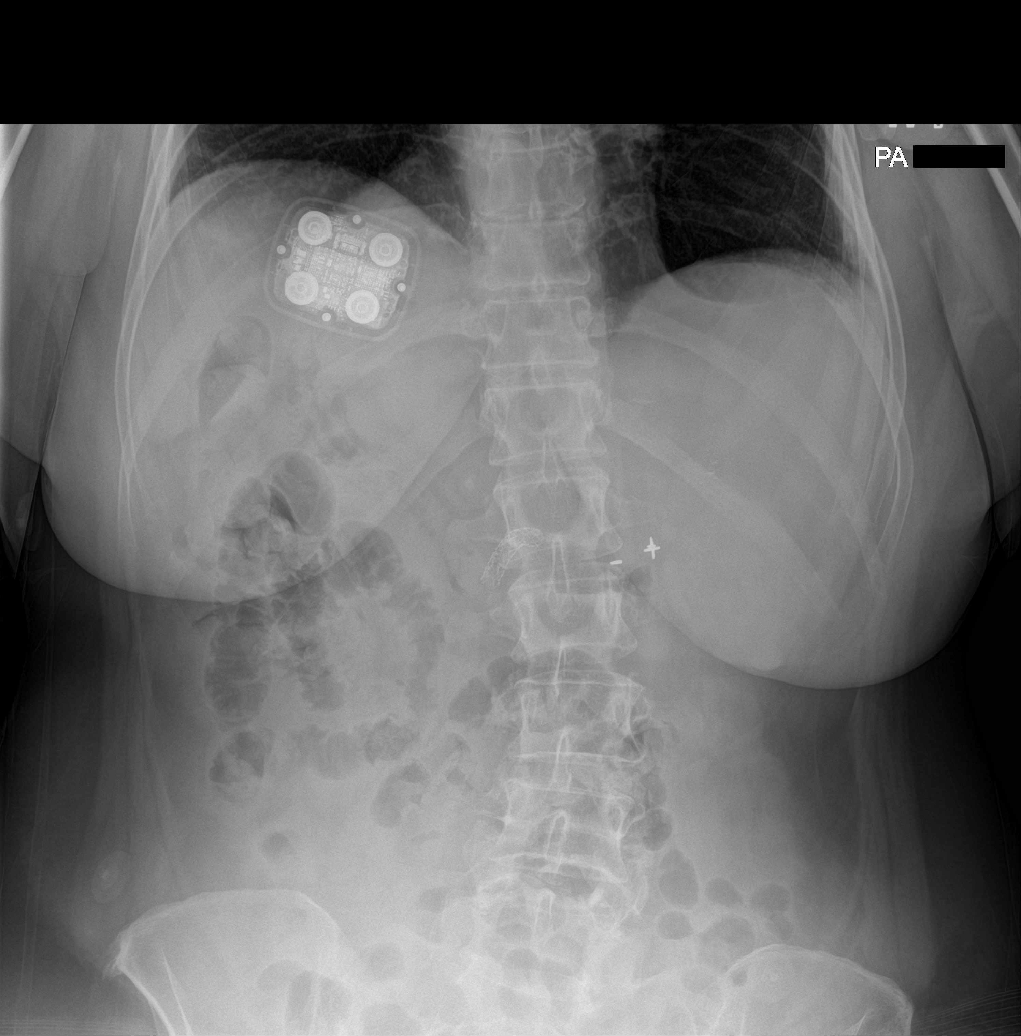
[im 3/4]
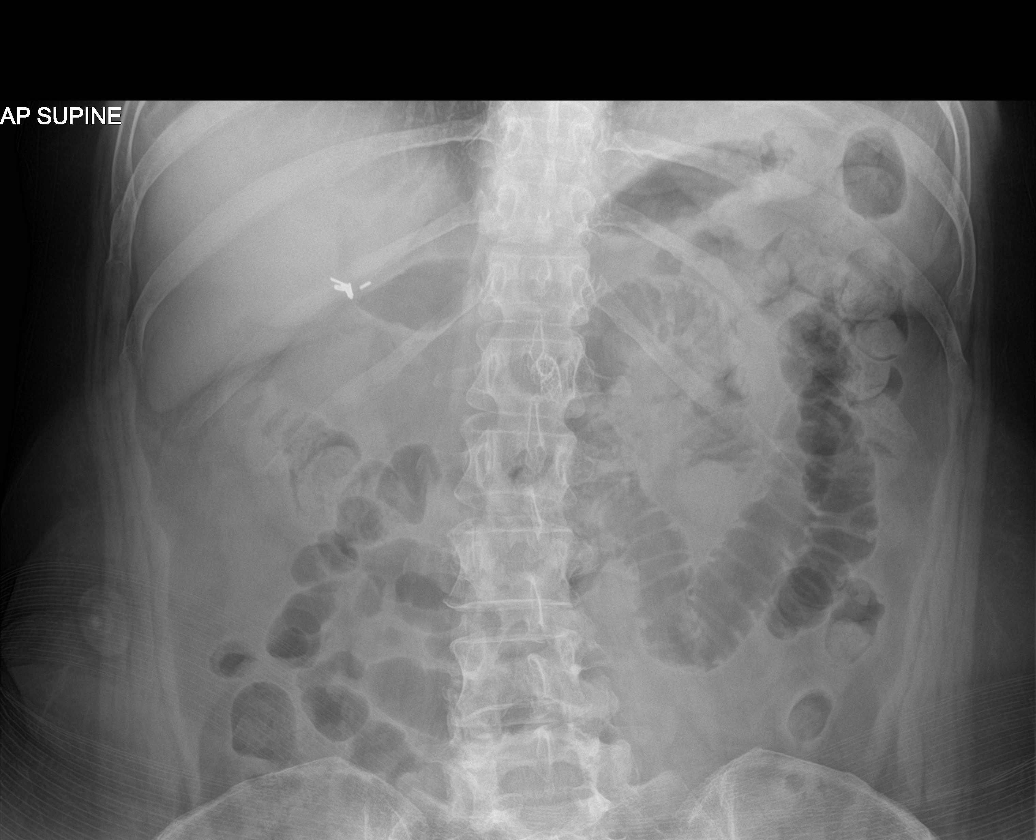
[im 4/4]
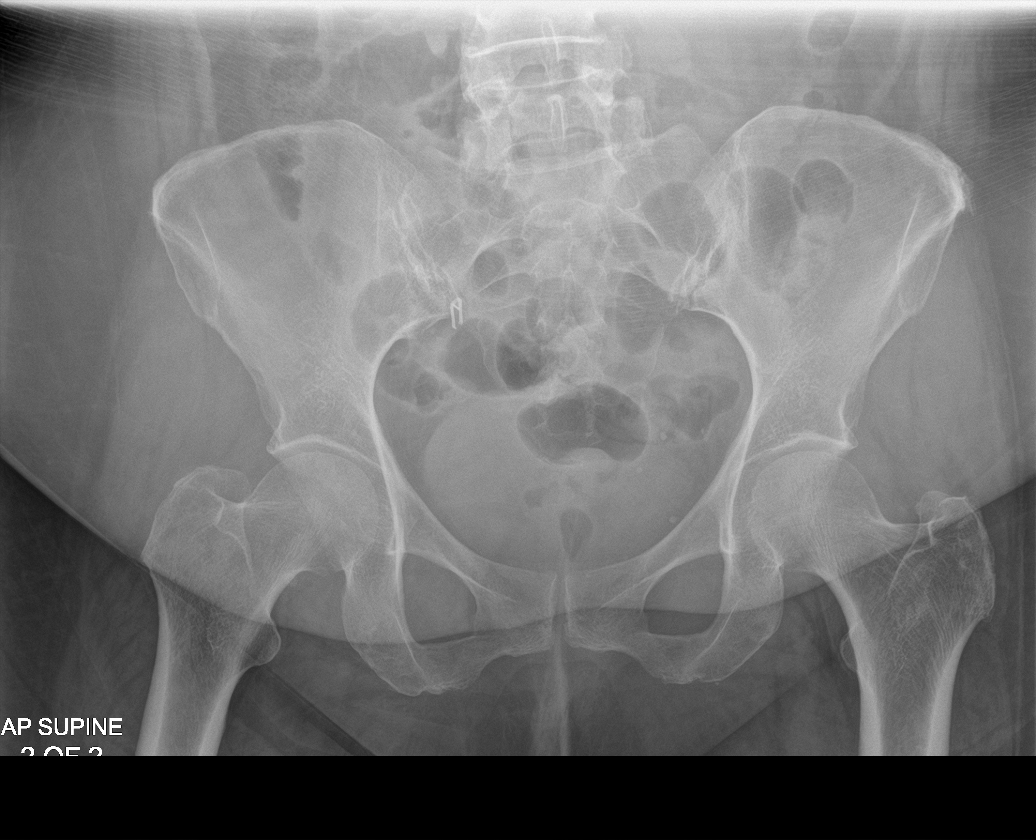

[4 of 4 positions shown; findings below may reference images not displayed]

FINDINGS: There are a few loops of borderline dilated small bowel in the left
upper quadrant. No air-fluid levels. Air and stool are noted
throughout the colon. No pneumoperitoneum. Prior cholecystectomy.
Proximal SMA stent. Surgical clips in the right pelvis. No acute
osseous abnormality.

Heart size and mediastinal contours are within normal limits. Both
lungs are clear.
IMPRESSION: 1. Few loops of borderline dilated small bowel in the left upper
quadrant, nonspecific, and could reflect enteritis. No definite
evidence of obstruction.
2.  No active cardiopulmonary disease.

## 2019-01-20 IMAGING — DX DG ABD PORTABLE 1V
2 series · 2 of 2 positions shown · non-contrast
Comparison: Same day CT of the abdomen and pelvis

CLINICAL DATA: NG tube placement

EXAM:
PORTABLE ABDOMEN - 1 VIEW

[abdomen kub (1 of 2)]
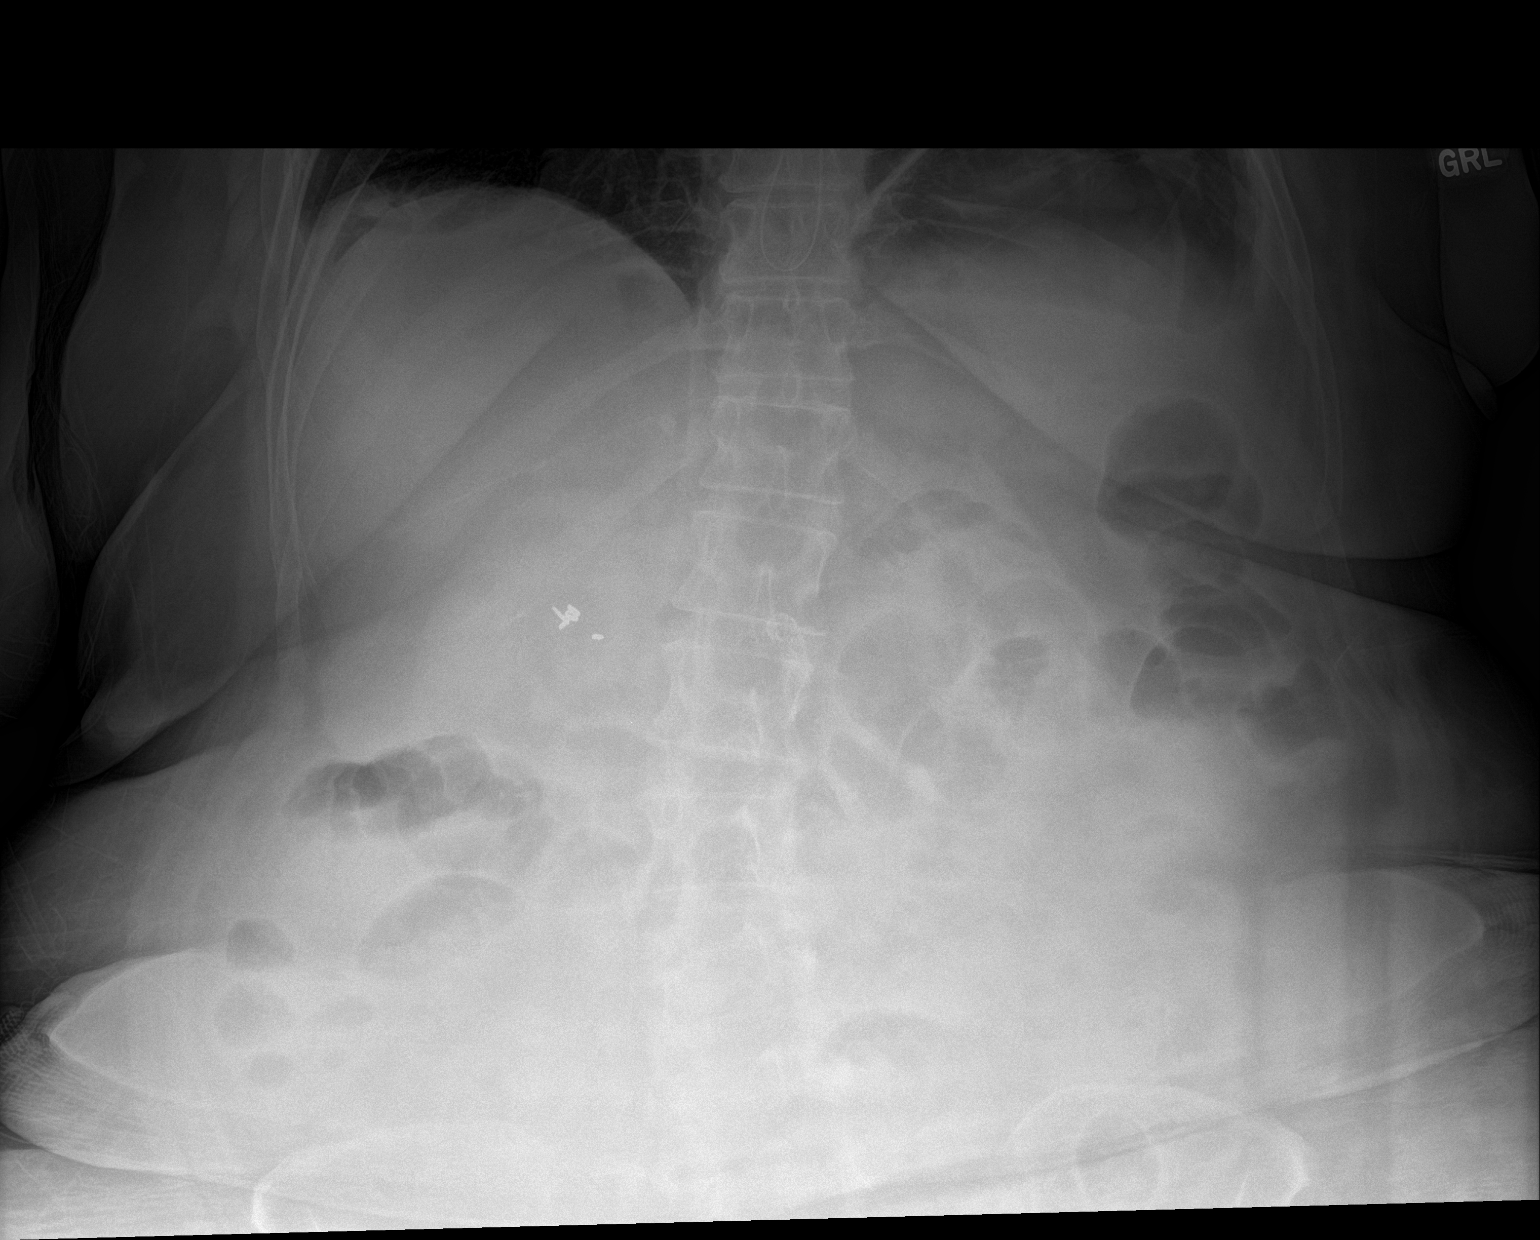

[abdomen kub (2 of 2)]
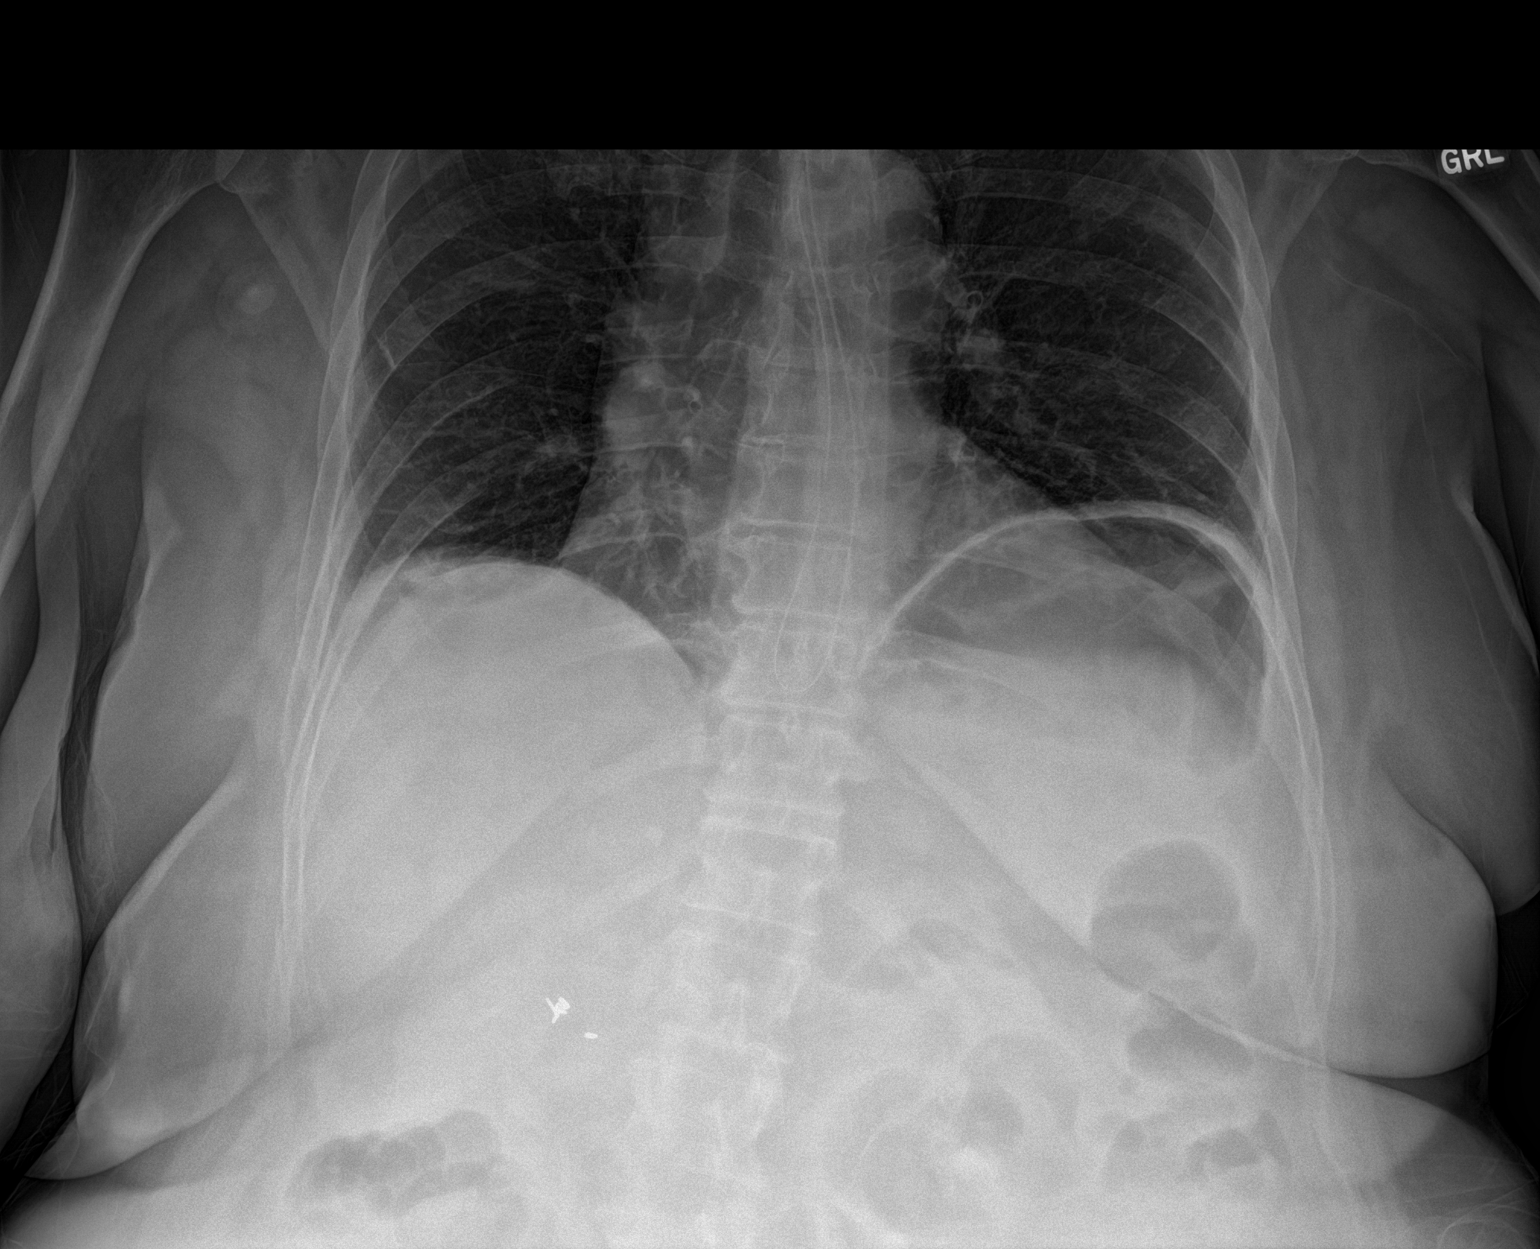

[2 of 2 positions shown; findings below may reference images not displayed]

FINDINGS: A gastric tube is seen coiled in the distal esophagus with the tip
projecting back up the esophagus. The tip is excluded however on
this current exam. Mild gaseous distention of small bowel loops with
air-fluid level in the stomach noted. Cholecystectomy clips are seen
in the right upper quadrant.
IMPRESSION: Malpositioned NG tube is seen coiling back up the esophagus. The tip
is excluded on current exam.

## 2019-01-20 IMAGING — CT CT CTA ABD/PEL W/CM AND/OR W/O CM
3 of 9 series · 10 of 46 positions shown, 16 images · IV contrast (isovue)
Comparison: 07/30/2017

CLINICAL DATA: Continued intermittent lower abdominal pain. History
of SMA thrombectomy and stent placement.

EXAM:
CTA ABDOMEN AND PELVIS wITHOUT AND WITH CONTRAST
TECHNIQUE: Multidetector CT imaging of the abdomen and pelvis was performed
using the standard protocol during bolus administration of
intravenous contrast. Multiplanar reconstructed images and MIPs were
obtained and reviewed to evaluate the vascular anatomy.
CONTRAST:  100 cc Isovue 370

[Series 4: axial arterial · axial · arterial · 0.75mm/px · z∈[-456,-400]mm · 2 of 250 slices shown]
[im 28/250  soft-tissue]
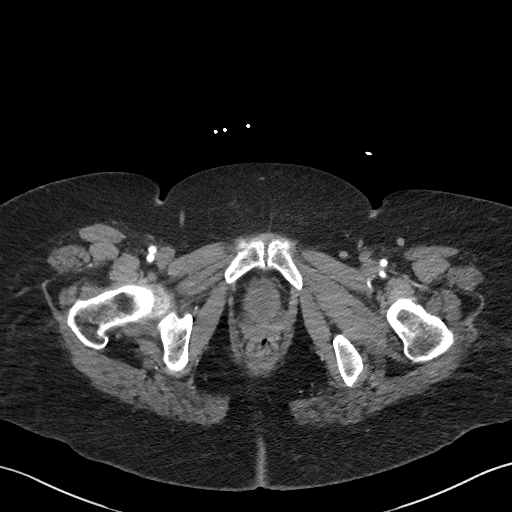
[im 56/250  soft-tissue]
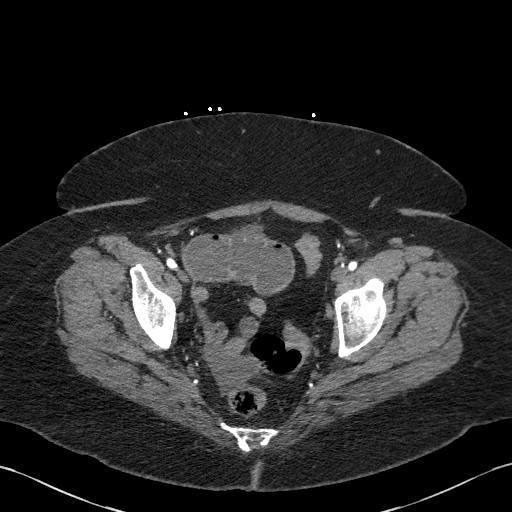

[Series 5: axial venous · axial · portal-venous · 0.75mm/px · z∈[-437,-87]mm · 6 of 100 slices shown, 11 images]
[im 15/100  soft-tissue]
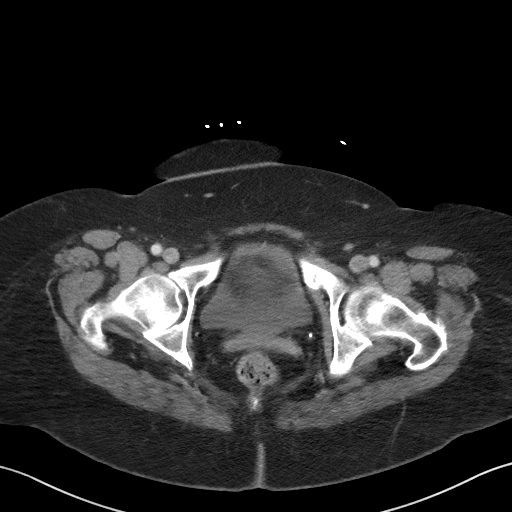
[im 15/100  bone]
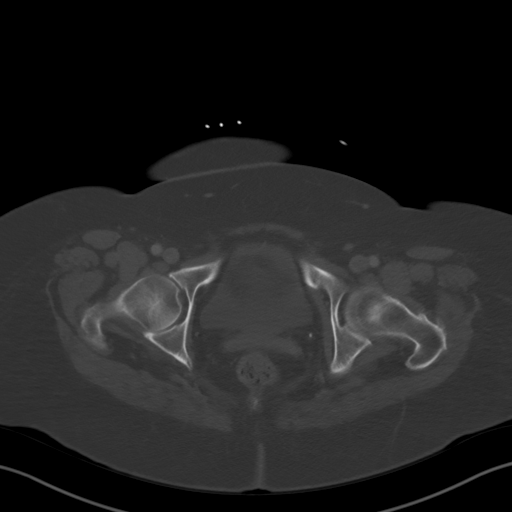
[im 29/100  soft-tissue]
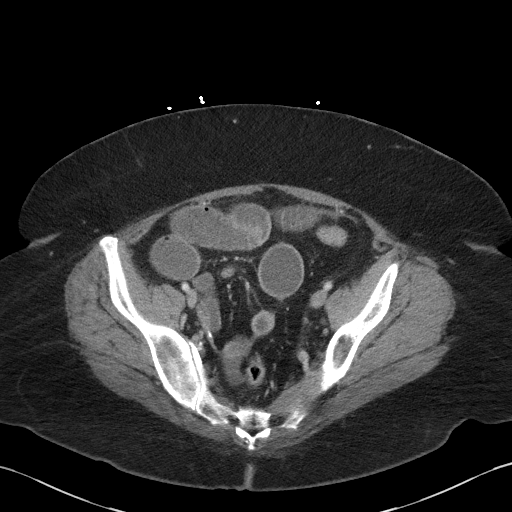
[im 43/100  soft-tissue]
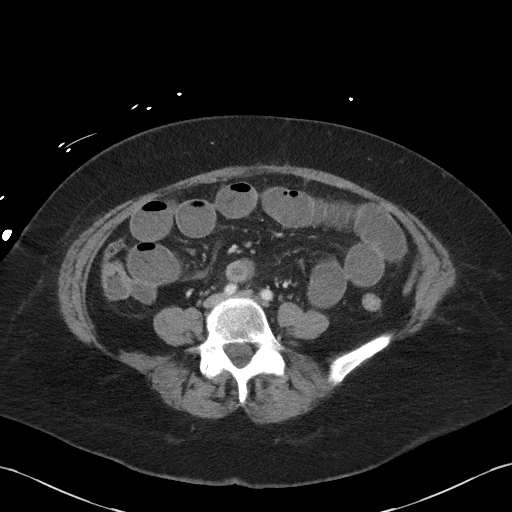
[im 43/100  lung]
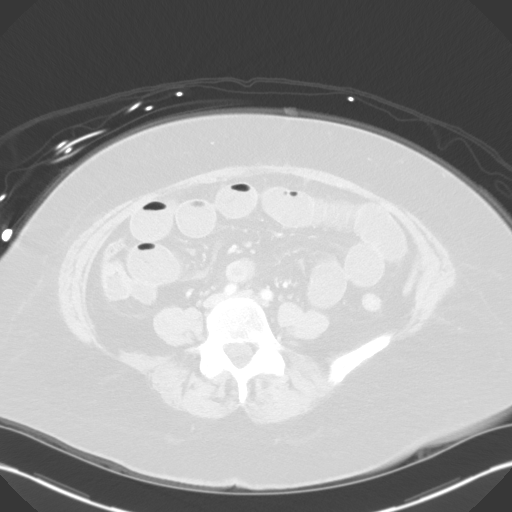
[im 57/100  soft-tissue]
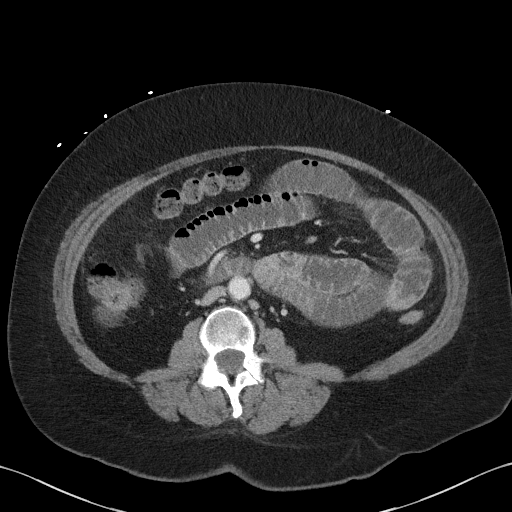
[im 57/100  lung]
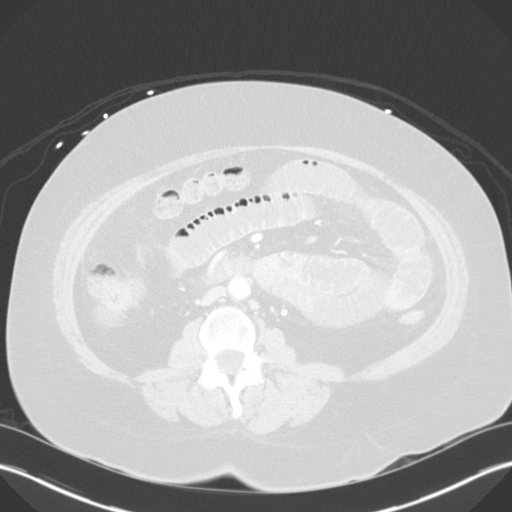
[im 71/100  soft-tissue]
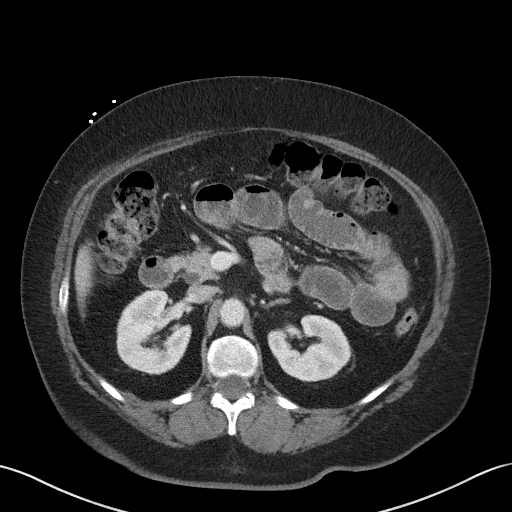
[im 71/100  lung]
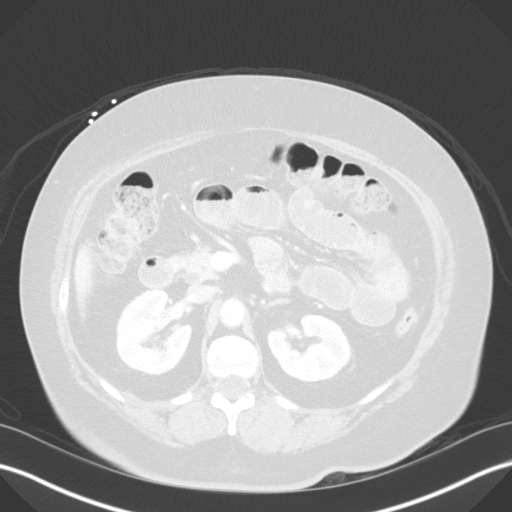
[im 85/100  soft-tissue]
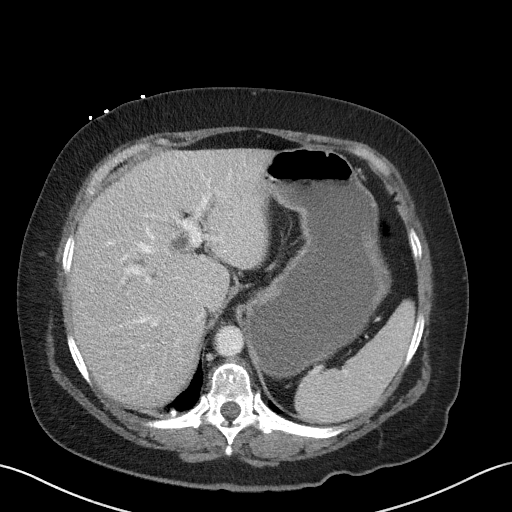
[im 85/100  lung]
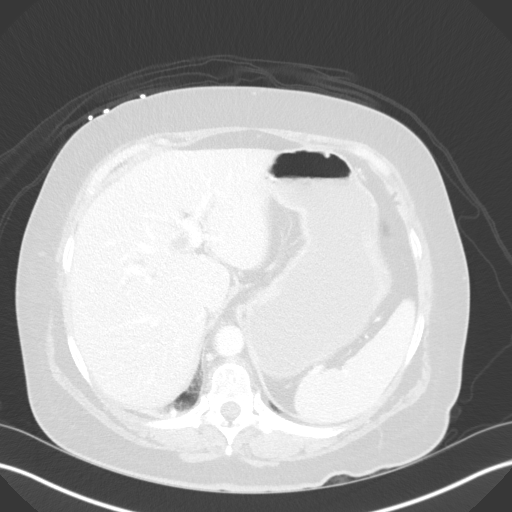

[Series 13: coronal venous mpr · coronal · portal-venous · 0.75mm/px · 2 of 151 slices shown, 3 images]
[im 51/151  soft-tissue]
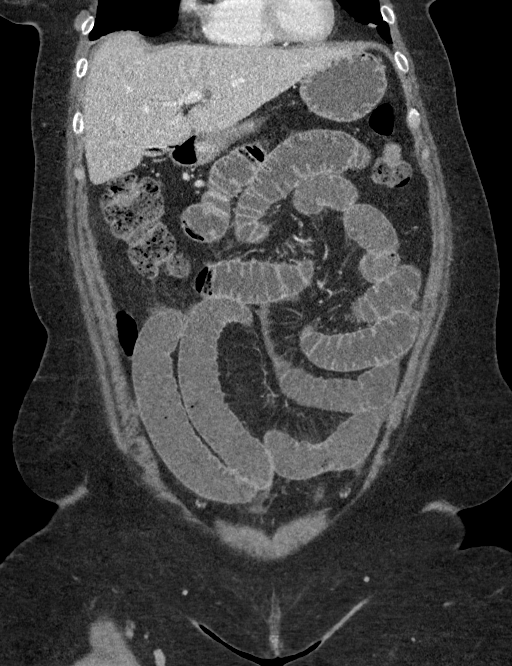
[im 51/151  bone]
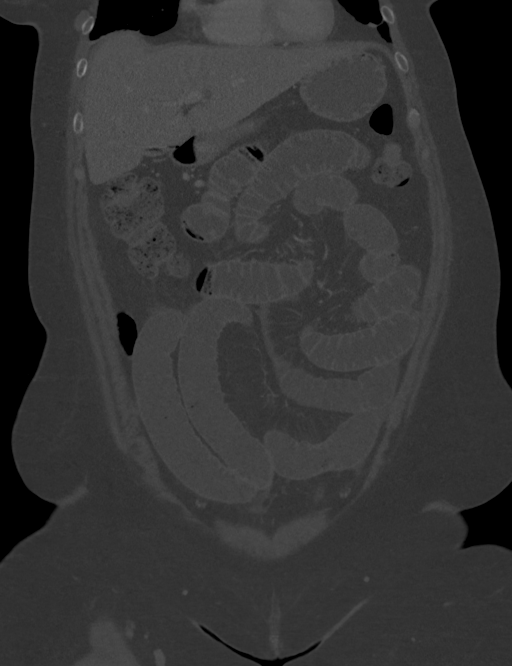
[im 101/151  soft-tissue]
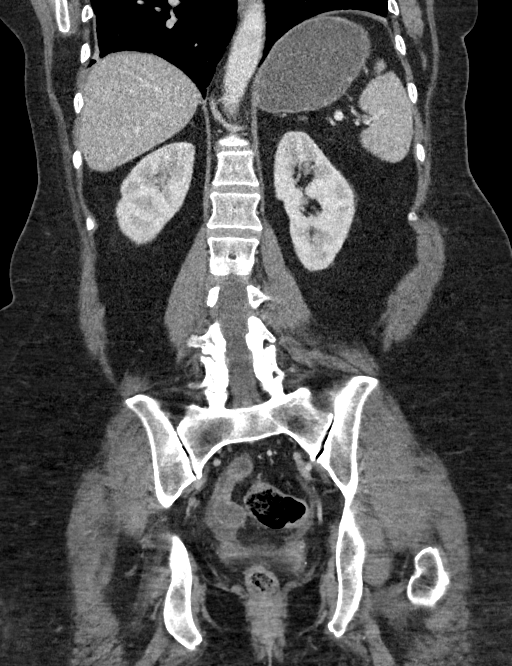

[10 of 46 positions shown; findings below may reference images not displayed]

FINDINGS: VASCULAR

Aorta: Patent with atherosclerotic calcification and mural thrombus
most prominent just proximal to the bifurcation.

Celiac: Widely patent.

SMA: Proximal SMA stent extends about 10 mm into the lumen of the
aorta. Lumen of the stent opacifies and there is opacification of
the SMA distal to the proximal stent.

Renals: Widely patent. Tiny accessory left renal artery evident
arising caudal to the main left renal artery and supplying the lower
pole. Single right renal artery appears widely patent.

IMA: Patent.

Inflow: Patent.

Proximal Outflow: Patent.

Veins: Non-opacified on this early arterial phase study.

Review of the MIP images confirms the above findings.

NON-VASCULAR

Lower chest: Calcified granuloma identified posterior right lower
lobe.

Hepatobiliary: No focal abnormality within the liver parenchyma.
Gallbladder surgically absent. Common duct measures 13 mm, similar
to prior.

Pancreas: No focal mass lesion. No dilatation of the main duct. No
intraparenchymal cyst. No peripancreatic edema.

Spleen: No splenomegaly. No focal mass lesion.

Adrenals/Urinary Tract: Right adrenal gland unremarkable. 15 mm left
adrenal adenoma had an average attenuation of Hounsfield units on
prior imaging suggesting adenoma. Kidneys unremarkable. No evidence
for hydroureter. The urinary bladder appears normal for the degree
of distention.

Stomach/Bowel: Stomach is nondistended. No gastric wall thickening.
No evidence of outlet obstruction. Duodenum is normally positioned
as is the ligament of Treitz. Jejunal loops are fluid-filled and
distended measuring up to 3 cm diameter. Ileal loops are also
fluid-filled and distended measuring up to 3.5 cm diameter.
Interloop mesenteric fluid is identified in the pelvis. Transition
zone in the ileum is identified on image 157 of series 4. This is an
essentially the same location as the ischemic bowel loop seen on the
study from 06/19/2017. On today's study, this represents about a 4-5
cm narrowed segment of small bowel with transmural circumferential
hyperenhancement, a characteristic appearance for post ischemic
stricture. Small bowel distal to this location is decompressed.
Terminal ileum is decompressed. The appendix is not visualized, but
there is no edema or inflammation in the region of the cecum. No
gross colonic mass. No colonic wall thickening. No substantial
diverticular change.

Lymphatic: Scattered small mesenteric and retroperitoneal lymph
nodes are evident. Insert no pelvic sidewall lymphadenopathy No
pelvic sidewall lymphadenopathy.

Reproductive: Uterus surgically absent.  There is no adnexal mass.

Other: Small volume intraperitoneal free fluid evident.

Musculoskeletal: Bone windows reveal no worrisome lytic or sclerotic
osseous lesions.
IMPRESSION: VASCULAR

SMA stent appears patent.

NON-VASCULAR

Small bowel obstruction due to post ischemic small bowel stricture
identified in the mid to distal small bowel, corresponding to the
ischemic bowel loop seen on the study from 06/19/2017.

## 2019-01-20 IMAGING — DX DG ABD PORTABLE 1V
1 series · 1 of 1 positions shown · non-contrast
Comparison: 08/06/2017 abdomen radiographs.

CLINICAL DATA: 56 y/o  F; readjustment of enteric tube.

EXAM:
PORTABLE ABDOMEN - 1 VIEW

[abdomen kub]
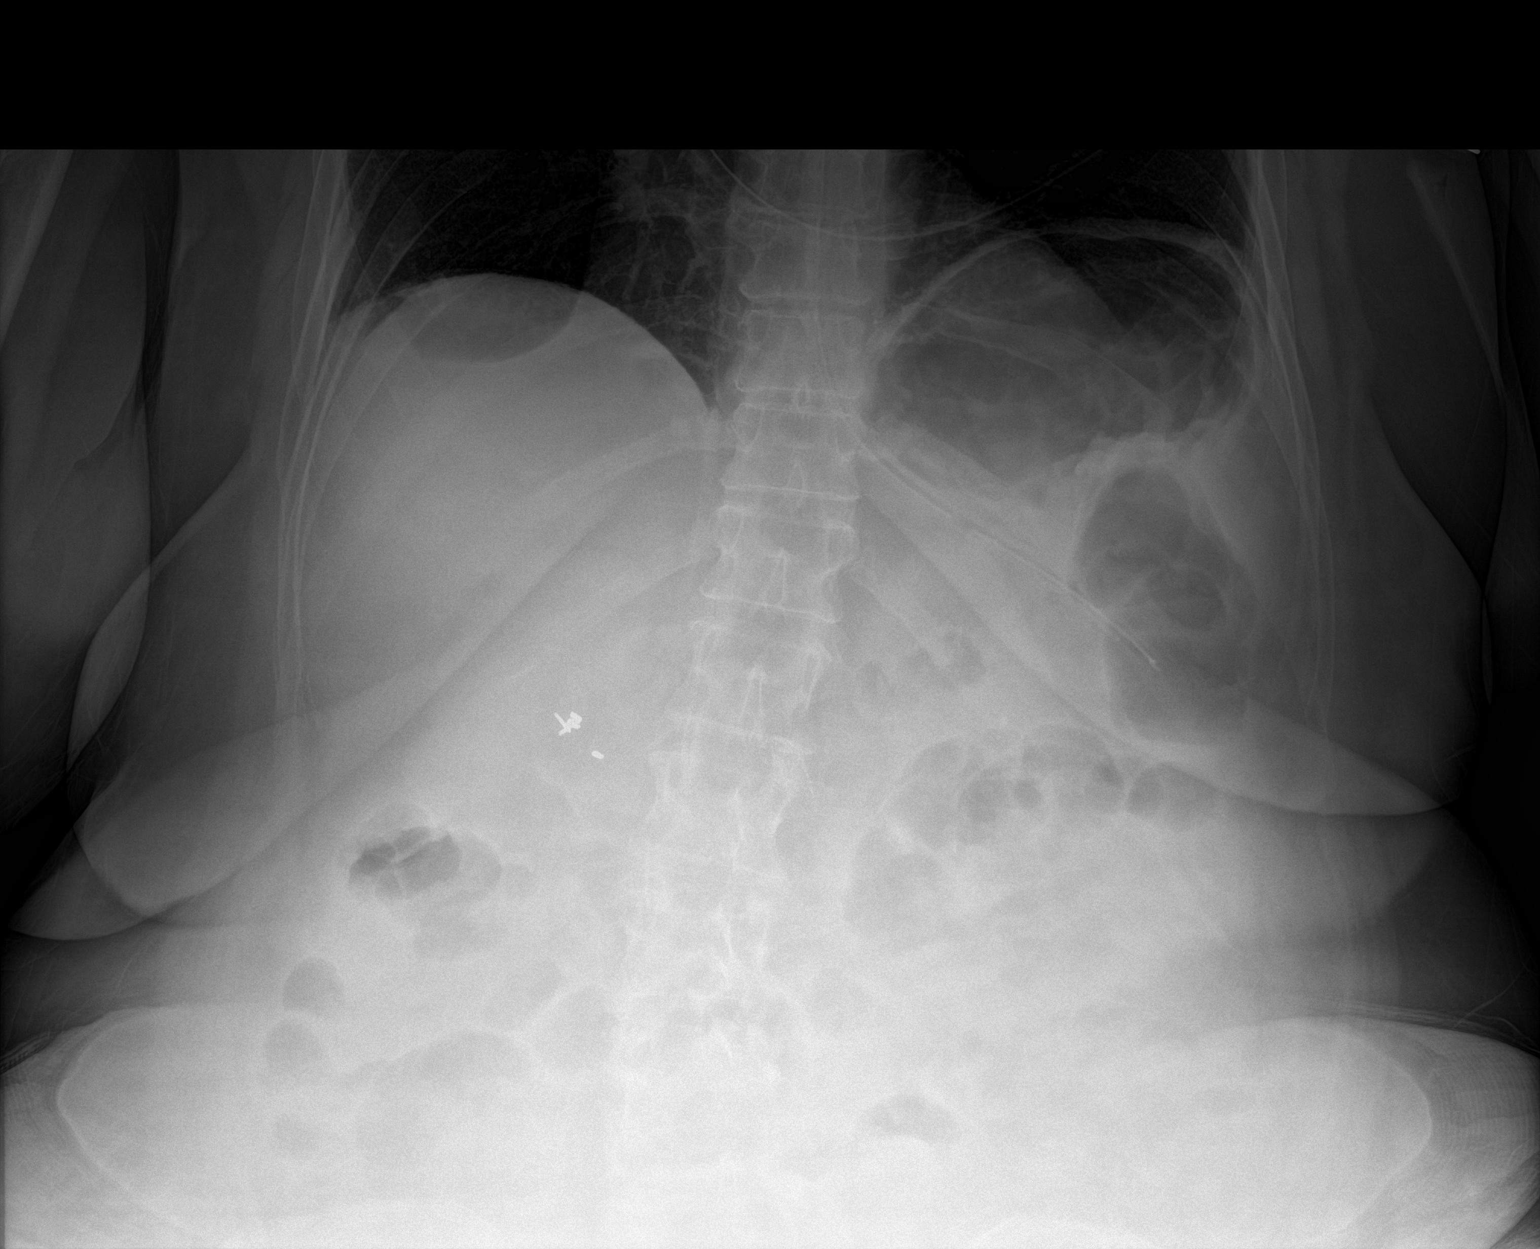

[1 of 1 positions shown; findings below may reference images not displayed]

FINDINGS: Enteric tube tip now projects over gastric body. Right upper
quadrant cholecystectomy clips. Normal bowel gas pattern. Mild
dextrocurvature of lumbar spine appear
IMPRESSION: Enteric tube has been readjusted with tip projecting over gastric
body.

By: Adalbjorn Patagoc M.D.

## 2019-01-21 IMAGING — CR DG ABDOMEN 2V
1 series · 3 of 3 positions shown · non-contrast
Comparison: 08/06/2017

CLINICAL DATA: SBO, abdominal pain

EXAM:
ABDOMEN - 2 VIEW

[Series 1: dg abd 2 views · 0.14mm/px · 3 of 3 slices shown]
[im 1/3]
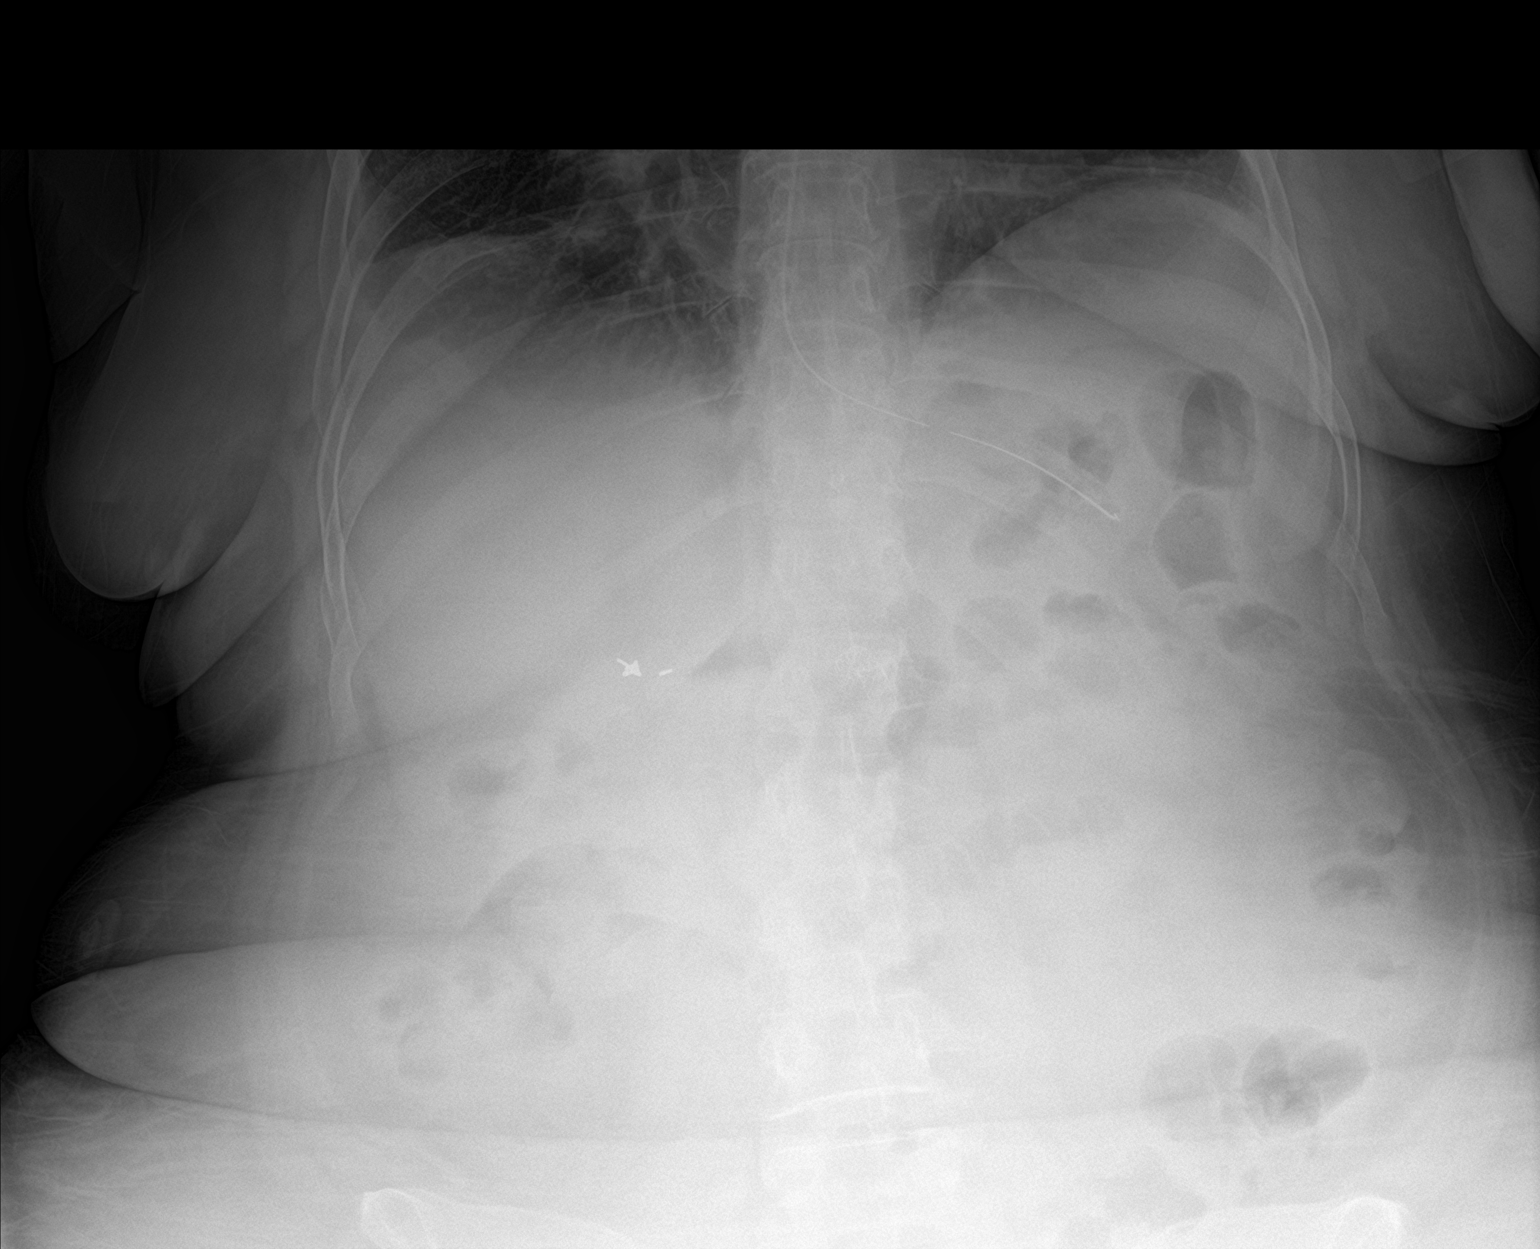
[im 2/3]
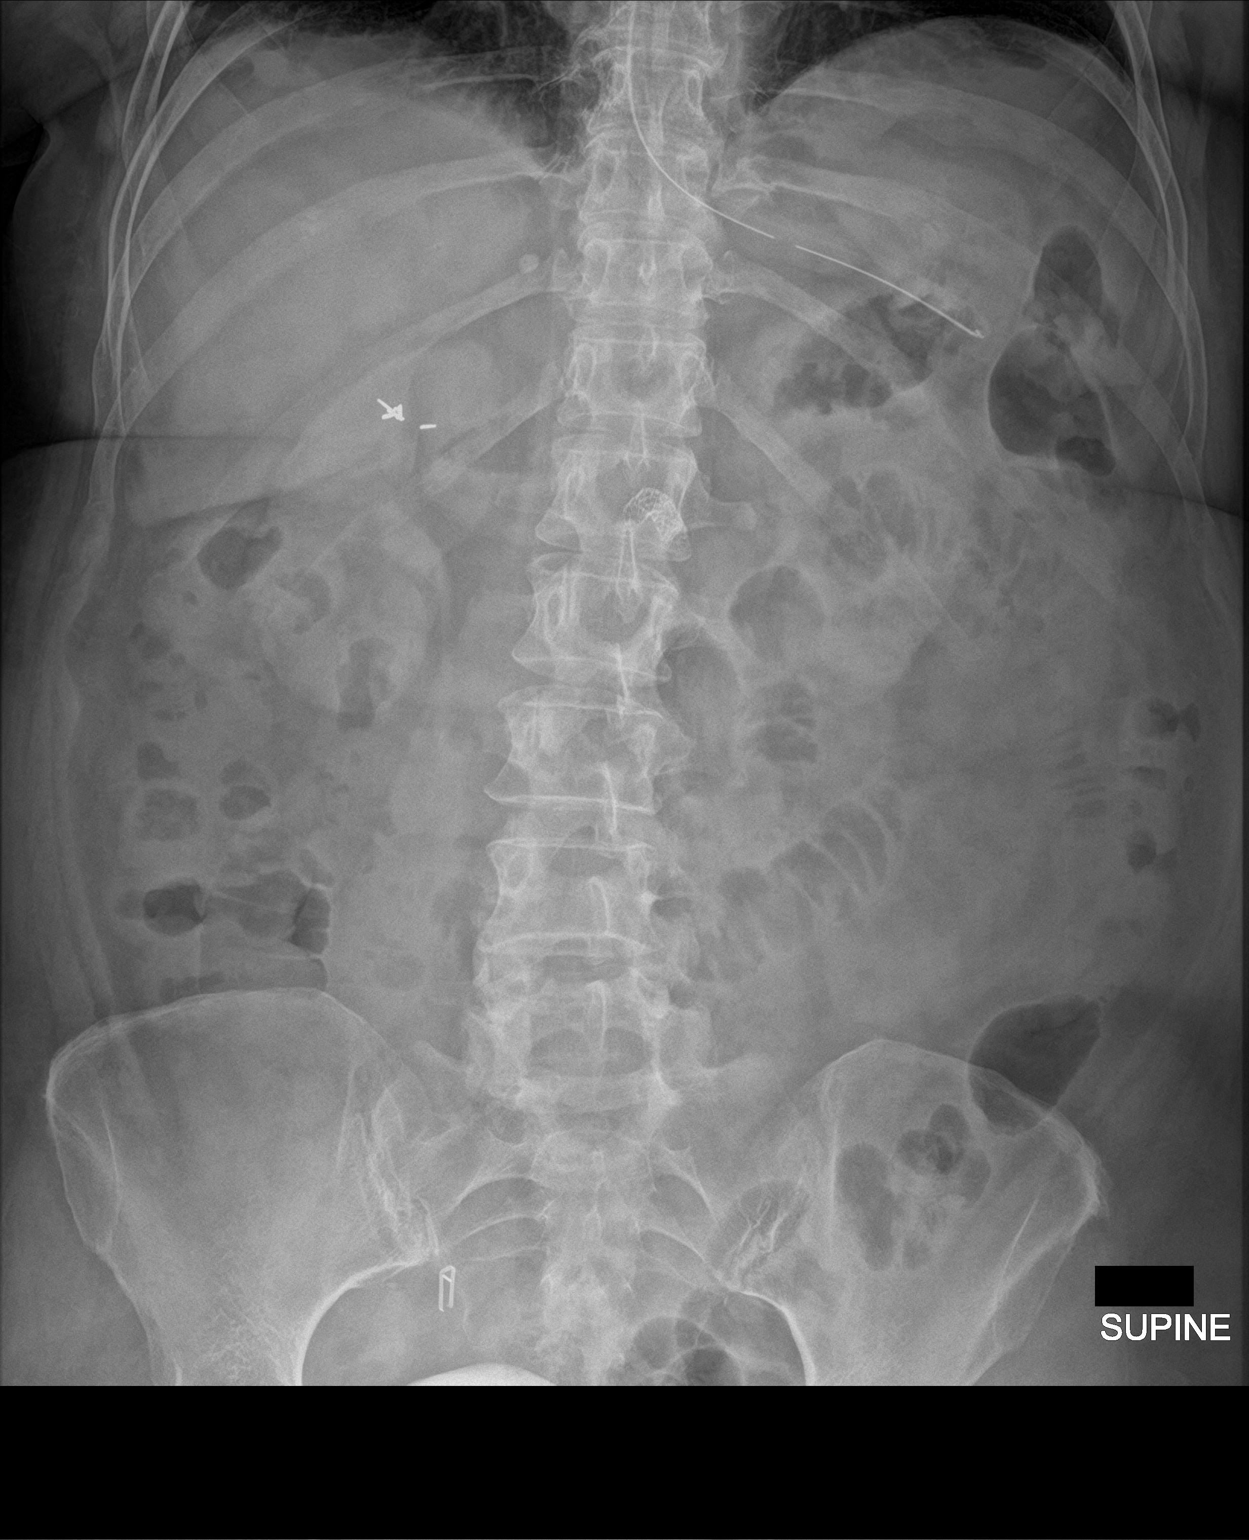
[im 3/3]
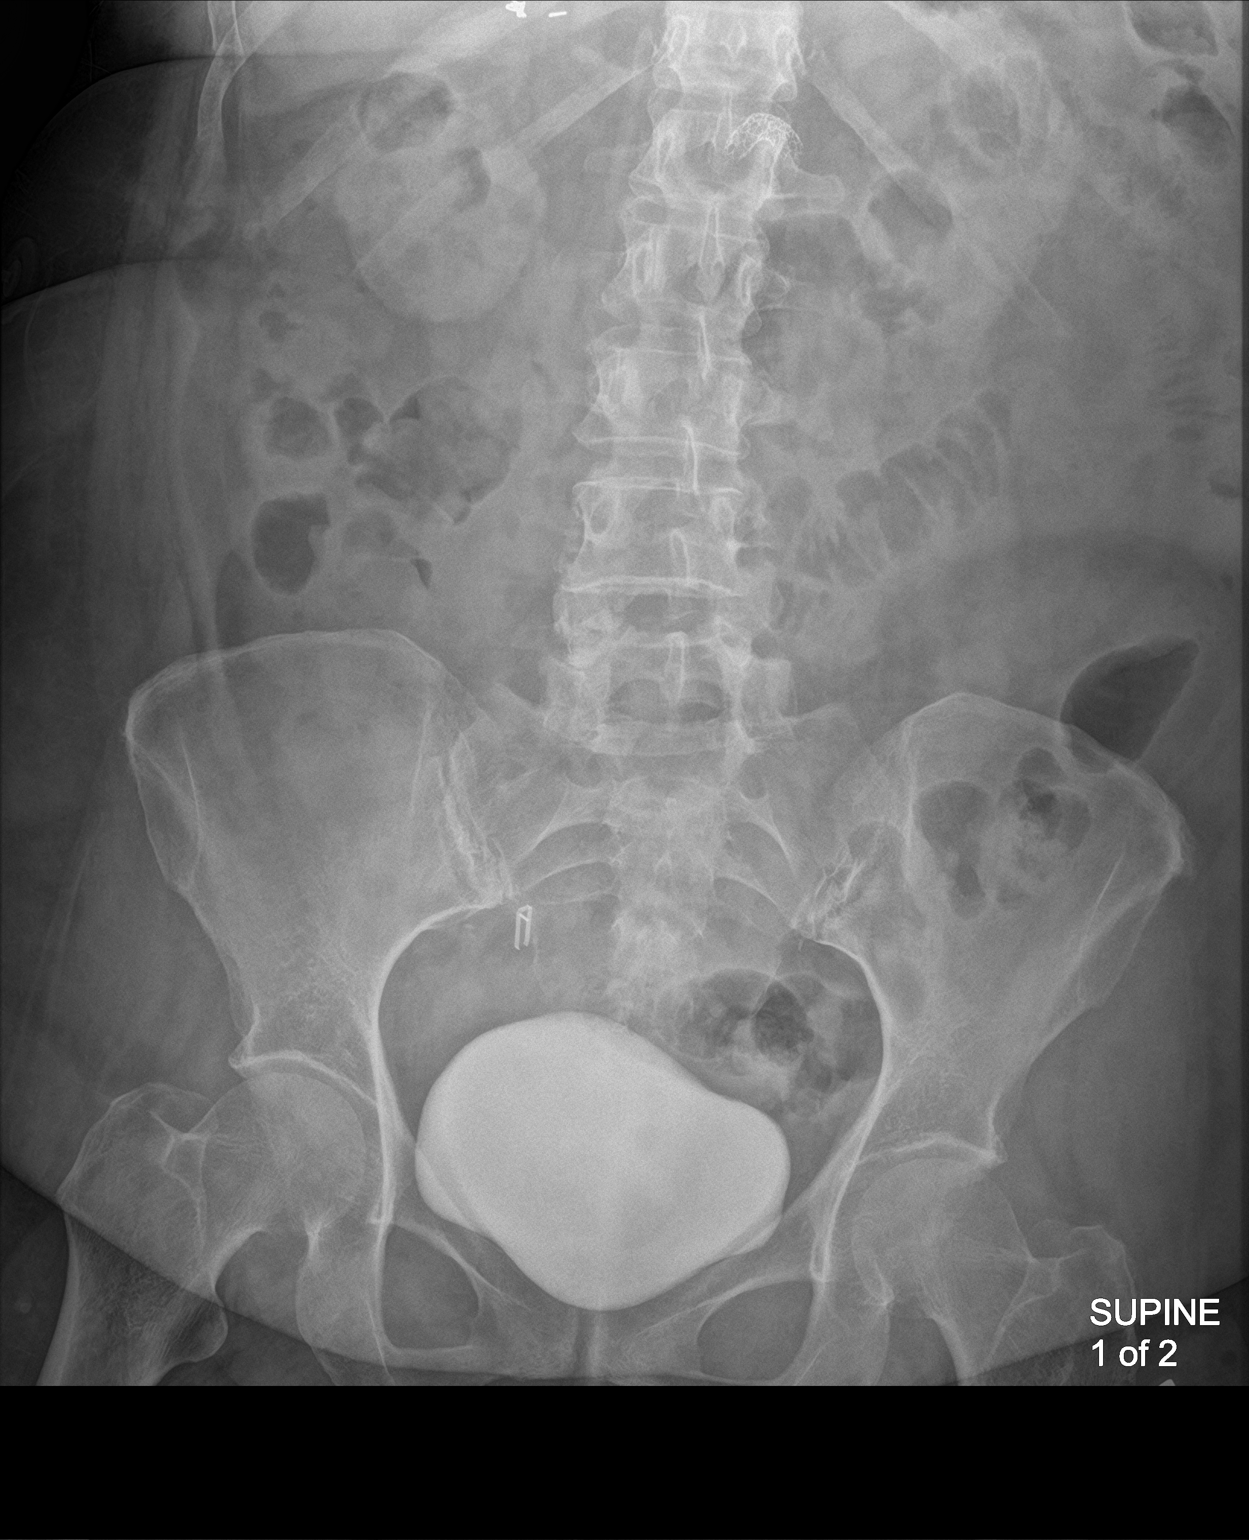

[3 of 3 positions shown; findings below may reference images not displayed]

FINDINGS: Enteric tube terminates in the proximal gastric body.

Mildly prominent loops of small bowel in the left mid abdomen,
correlating with suspected small bowel obstruction on CT. Colon is
not decompressed.

Cholecystectomy clips.

Vascular stent in the upper mid abdomen.

Excretory contrast in the bladder.
IMPRESSION: Mildly prominent loops of small bowel in the left mid abdomen,
correlating with suspected small bowel obstruction on CT.

Enteric tube terminates in the proximal gastric body

## 2019-01-22 IMAGING — DX DG ABD PORTABLE 2V
2 series · 2 of 2 positions shown · non-contrast
Comparison: Radiographs August 07, 2017.

CLINICAL DATA: Small bowel obstruction.

EXAM:
PORTABLE ABDOMEN - 2 VIEW

[abdomen erect (1 of 2)]
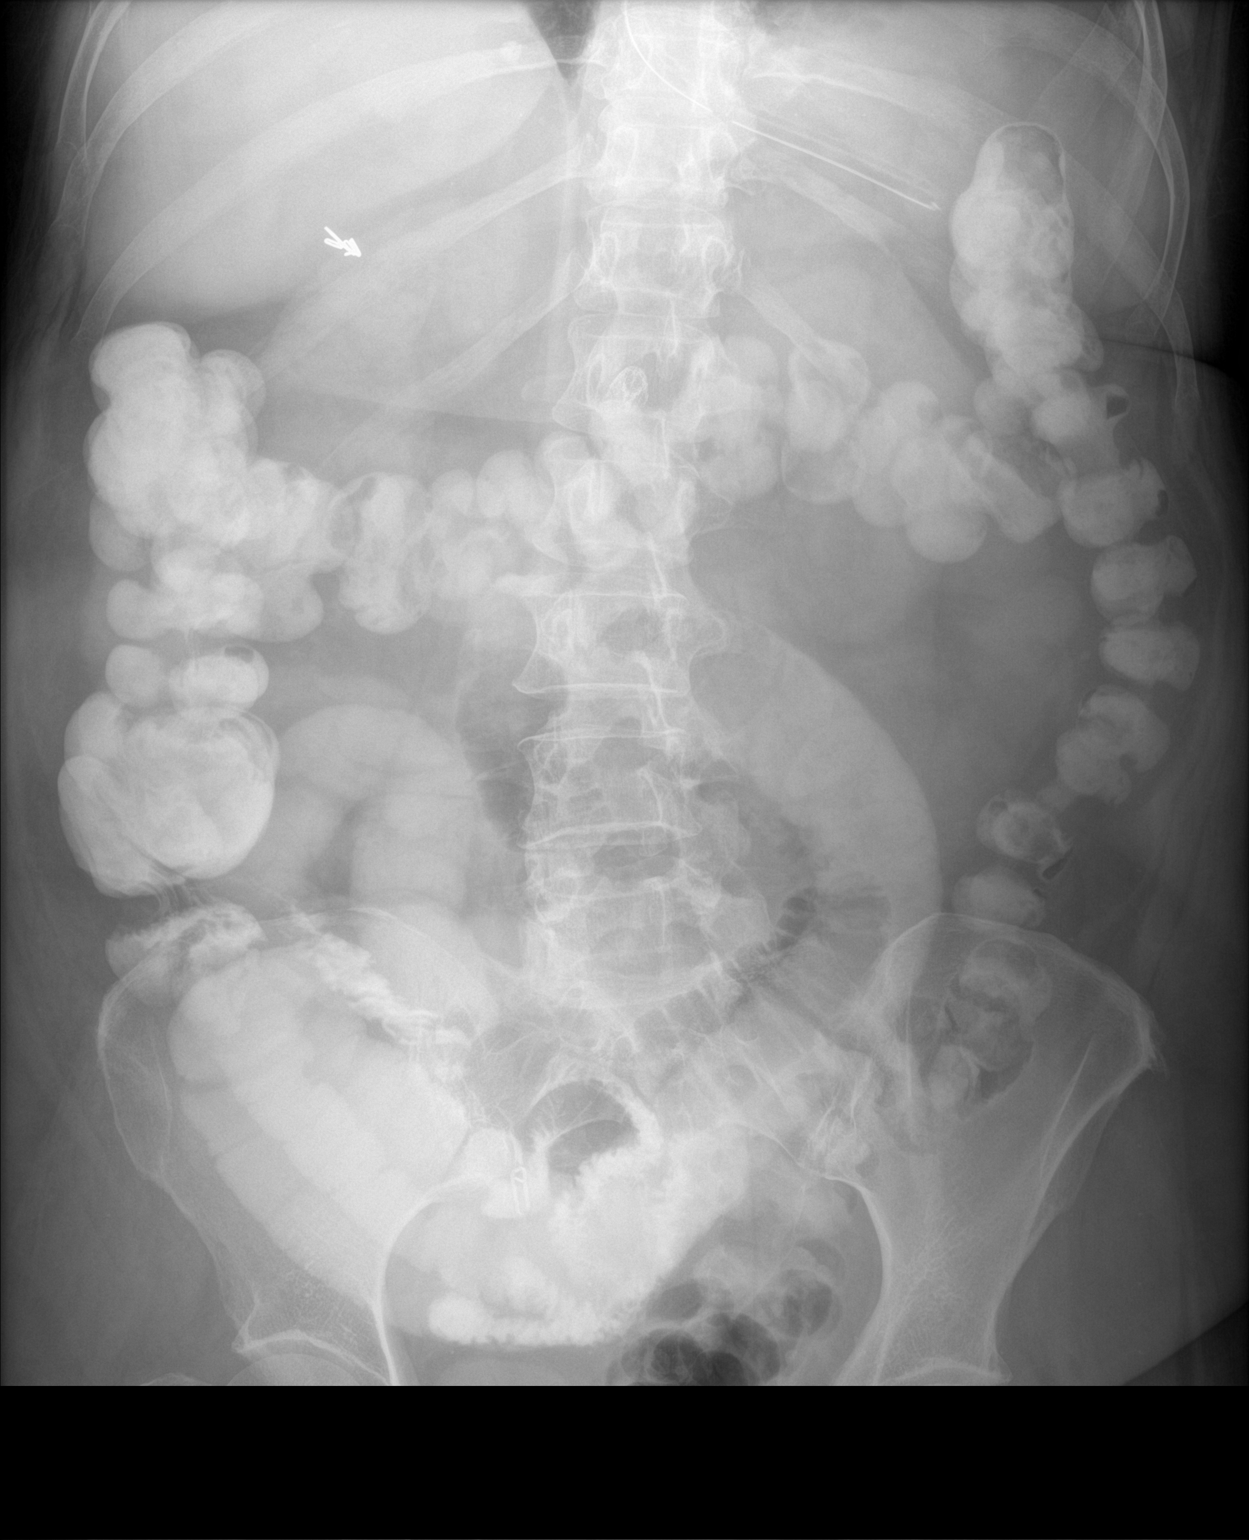

[abdomen erect (2 of 2)]
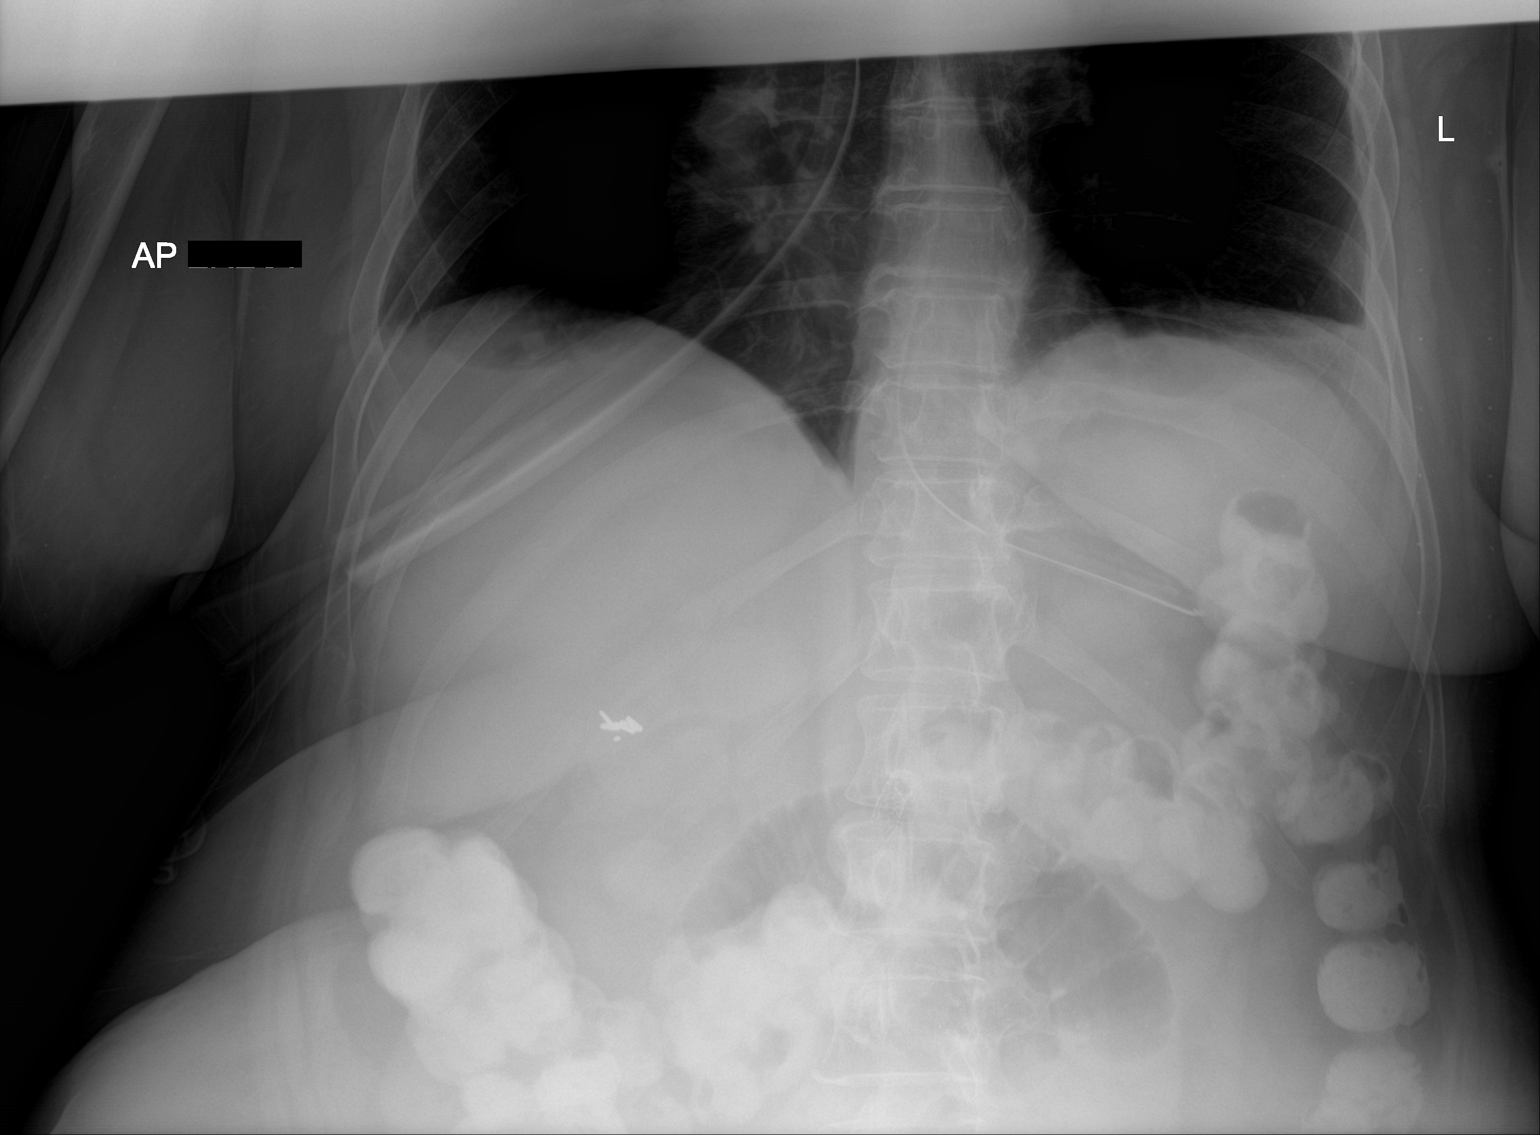

[2 of 2 positions shown; findings below may reference images not displayed]

FINDINGS: Status post cholecystectomy. Residual contrast is seen in nondilated
colon. Residual contrast is noted in dilated small bowel loops
concerning for distal small bowel obstruction. No abnormal
calcifications are noted.
IMPRESSION: Dilated small bowel loops are noted which contain contrast, and this
is concerning for partial distal small bowel obstruction. Contrast
is noted in nondilated colon.

## 2019-01-25 IMAGING — DX DG ABD PORTABLE 2V
3 series · 3 of 3 positions shown · non-contrast
Comparison: 08/08/2017

CLINICAL DATA: Ileus following gastrointestinal surgery

EXAM:
PORTABLE ABDOMEN - 2 VIEW

[abdomen erect]
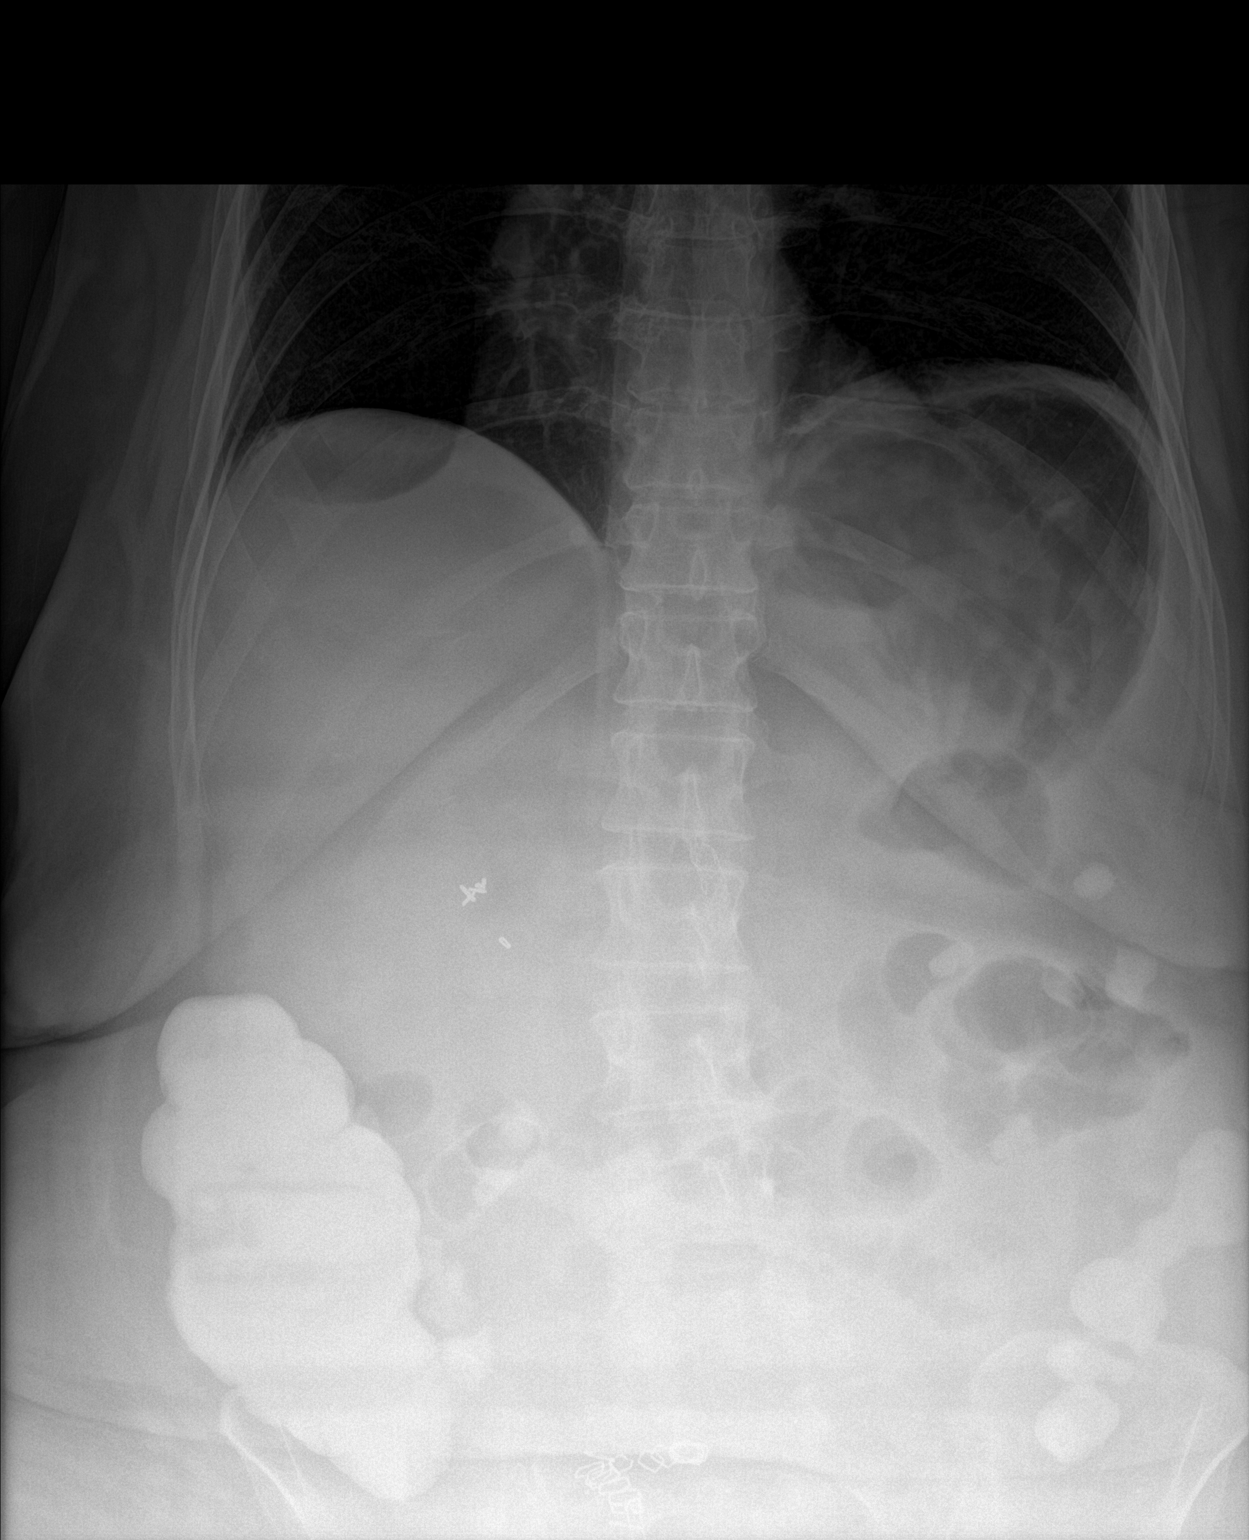

[abdomen supine (1 of 2)]
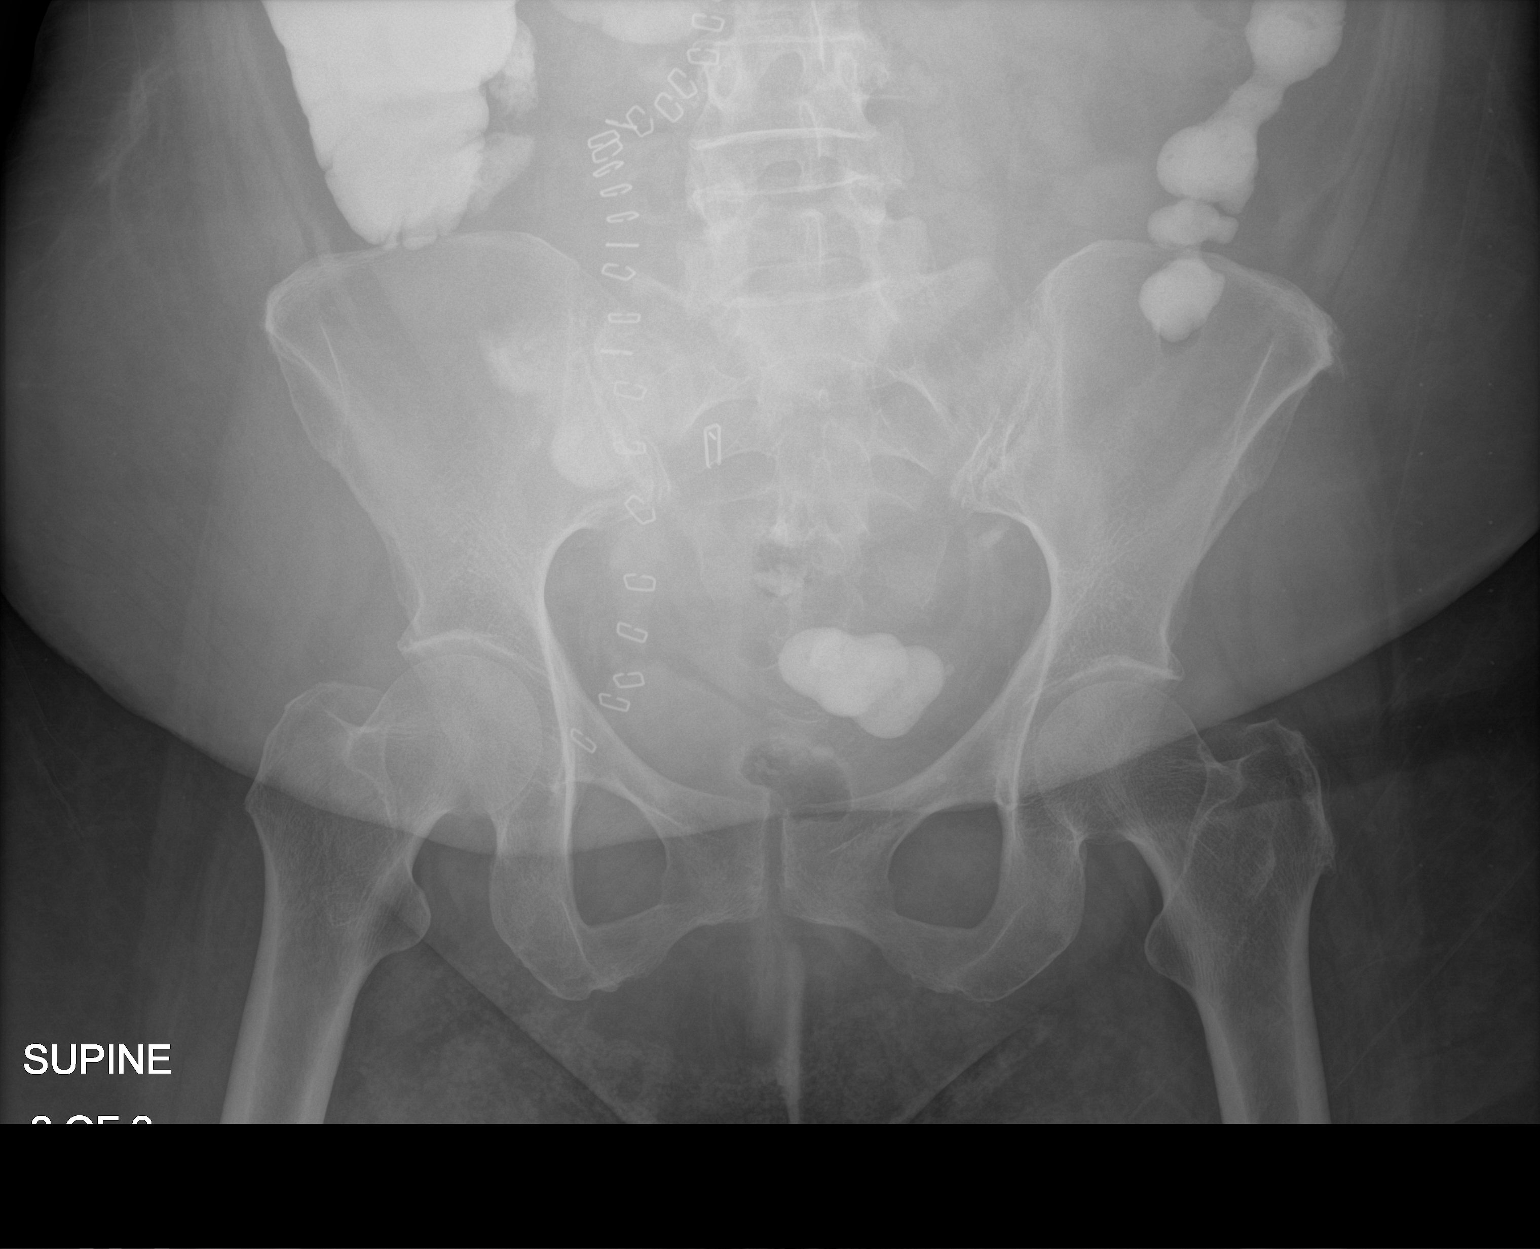

[abdomen supine (2 of 2)]
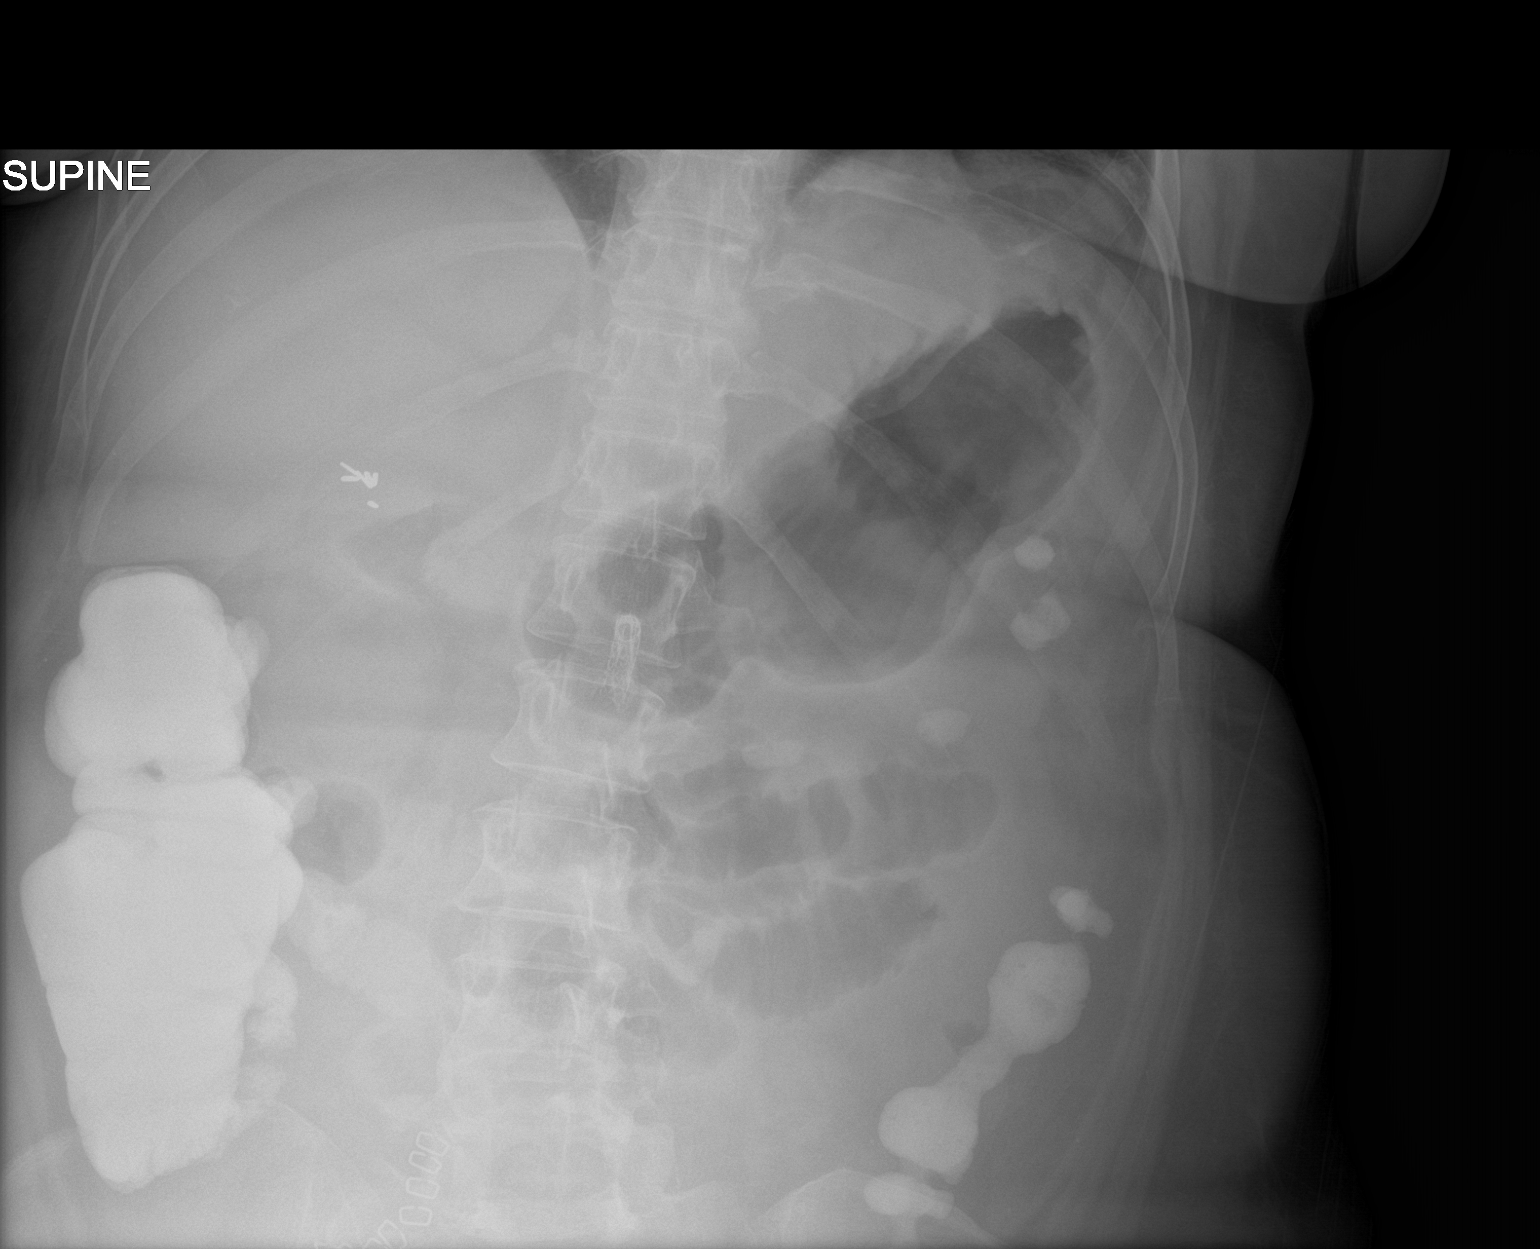

[3 of 3 positions shown; findings below may reference images not displayed]

FINDINGS: NG tube is been removed. Stomach mildly distended. CT oral contrast
in the colon has been partially evacuated. Progression of small
bowel contrast into the right colon since the prior study.
Improvement in small bowel dilatation. No free air.
IMPRESSION: Improvement in ileus/obstruction. Oral contrast has passed to the
small bowel into the colon and has evacuated partially from the
colon. Decreased small bowel dilatation.

## 2019-01-29 IMAGING — CT CT ABD-PELV W/ CM
2 of 5 series · 16 of 46 positions shown, 18 images · IV contrast (APPLIED)
Comparison: CT 08/06/2017, small-bowel follow-through 06071

CLINICAL DATA: Patient status post thromboembolectomy and stent
placement for superior mesenteric artery embolism. Continue
abdominal pain

EXAM:
CT ABDOMEN AND PELVIS WITH CONTRAST
TECHNIQUE: Multidetector CT imaging of the abdomen and pelvis was performed
using the standard protocol following bolus administration of
intravenous contrast.
CONTRAST:  100mL 0HP268-YBB IOPAMIDOL (0HP268-YBB) INJECTION 61%

[Series 2: routine abd/pel with · axial · 0.85mm/px · z∈[-1282,-862]mm · 13 of 100 slices shown, 15 images]
[im 8/100  soft-tissue]
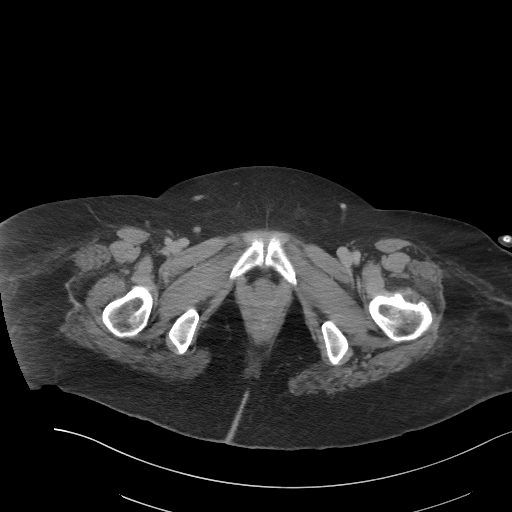
[im 8/100  bone]
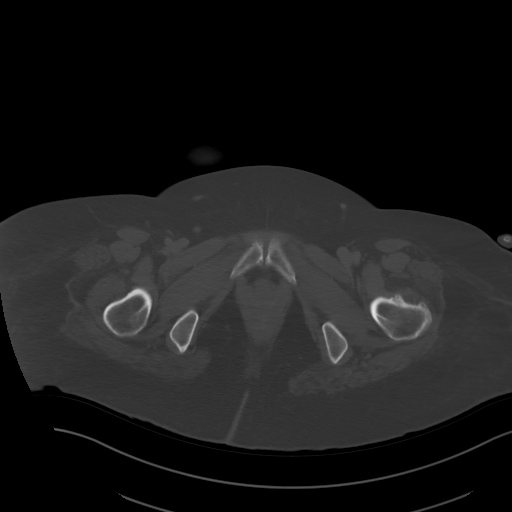
[im 15/100  soft-tissue]
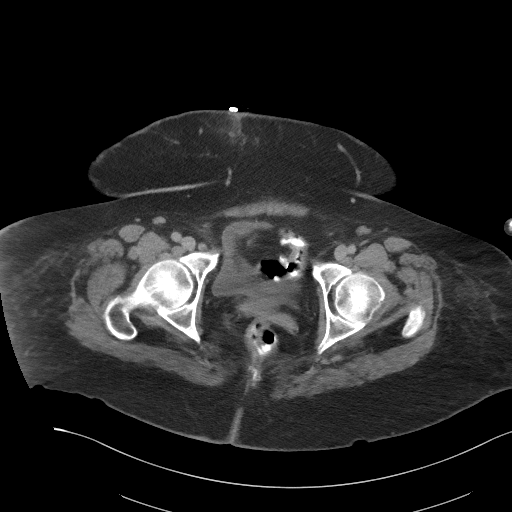
[im 22/100  soft-tissue]
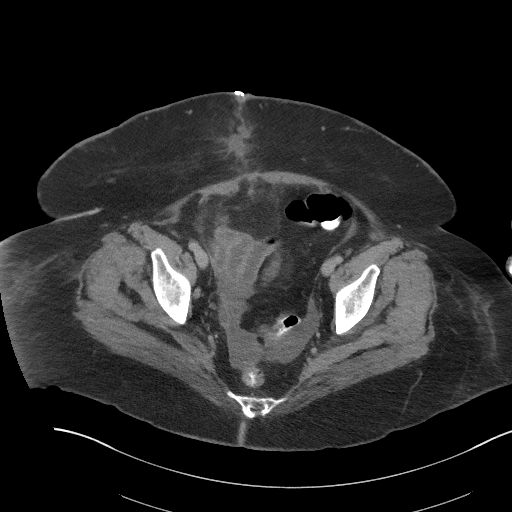
[im 29/100  soft-tissue]
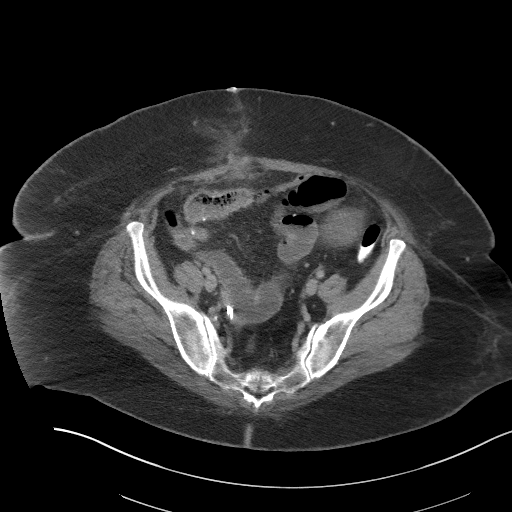
[im 36/100  soft-tissue]
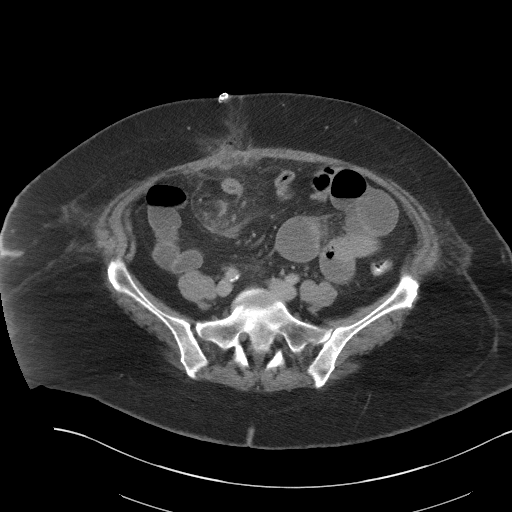
[im 43/100  soft-tissue]
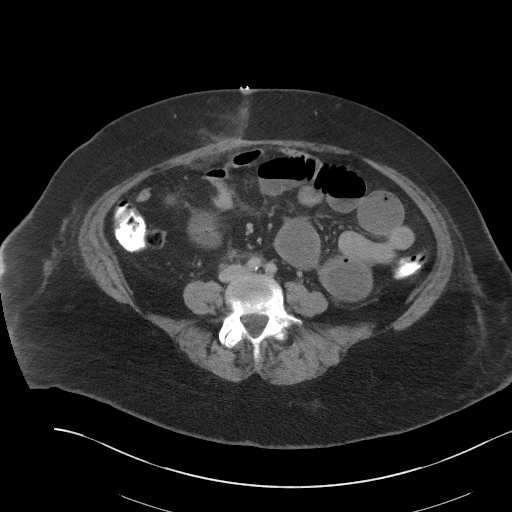
[im 50/100  soft-tissue]
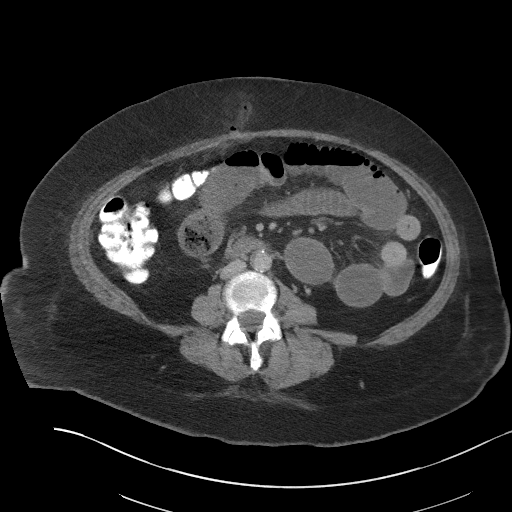
[im 57/100  soft-tissue]
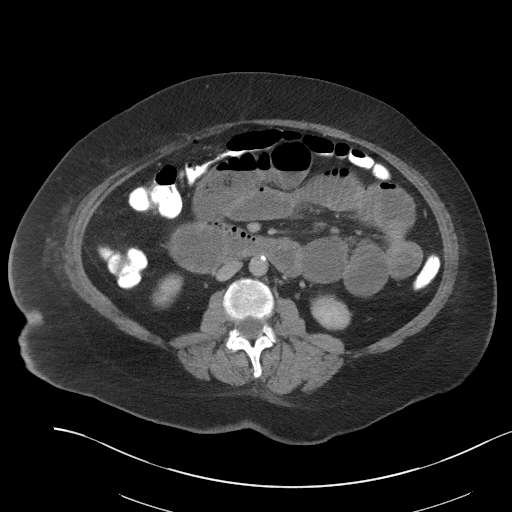
[im 64/100  soft-tissue]
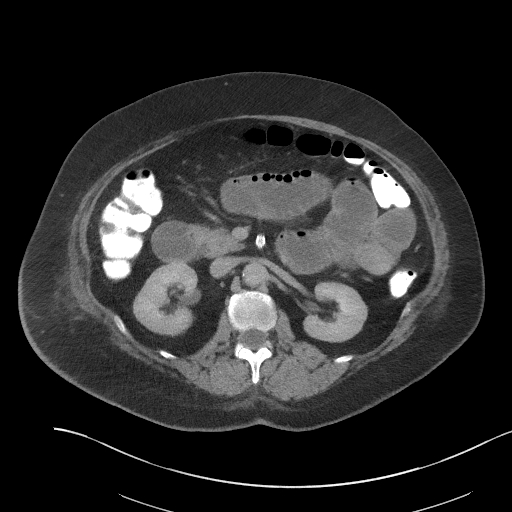
[im 64/100  bone]
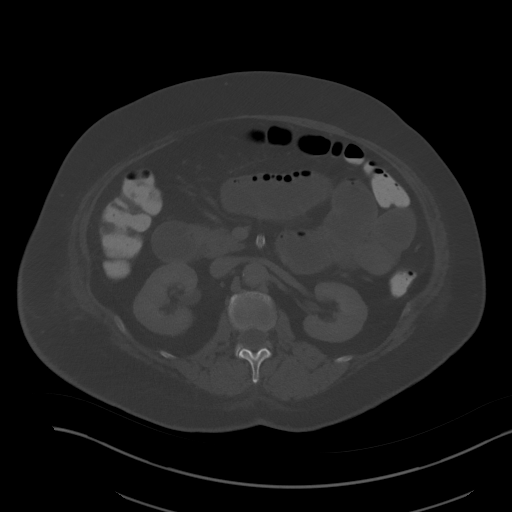
[im 71/100  soft-tissue]
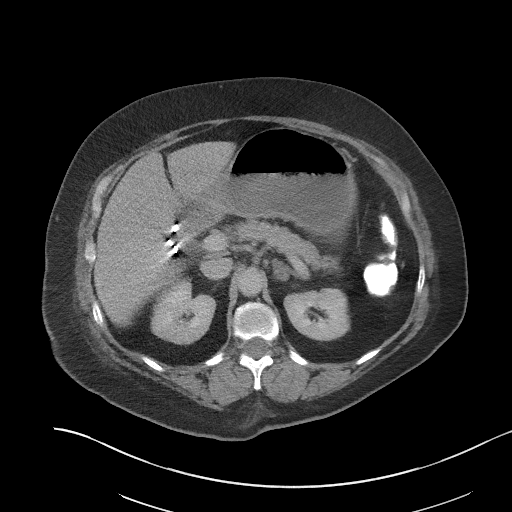
[im 78/100  soft-tissue]
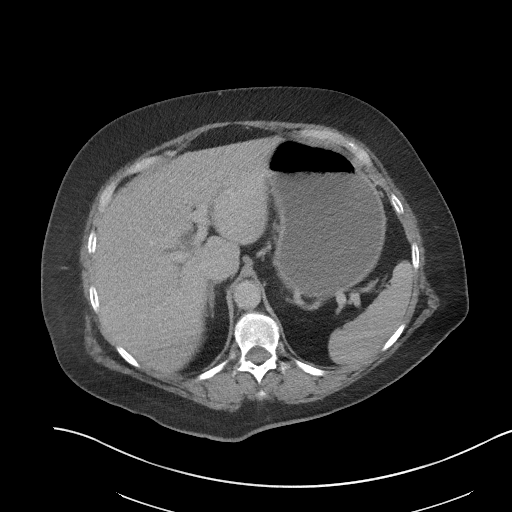
[im 85/100  soft-tissue]
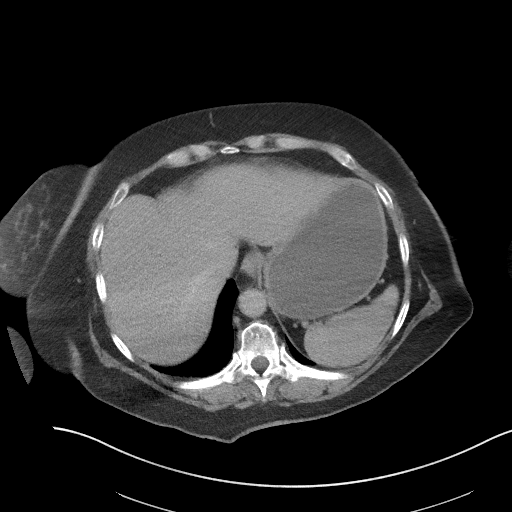
[im 92/100  soft-tissue]
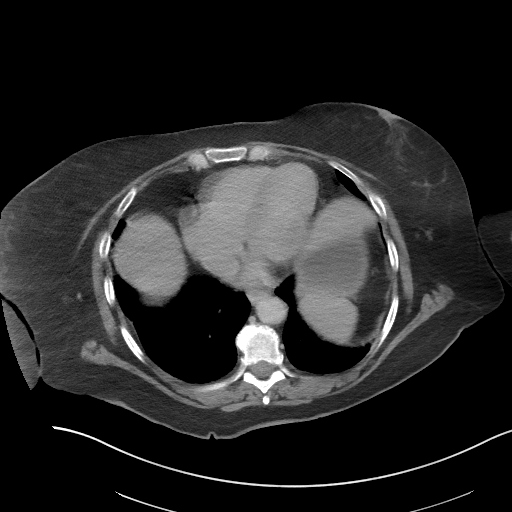

[Series 5: coronal st · coronal · 0.81mm/px · 3 of 101 slices shown]
[im 34/101  soft-tissue]
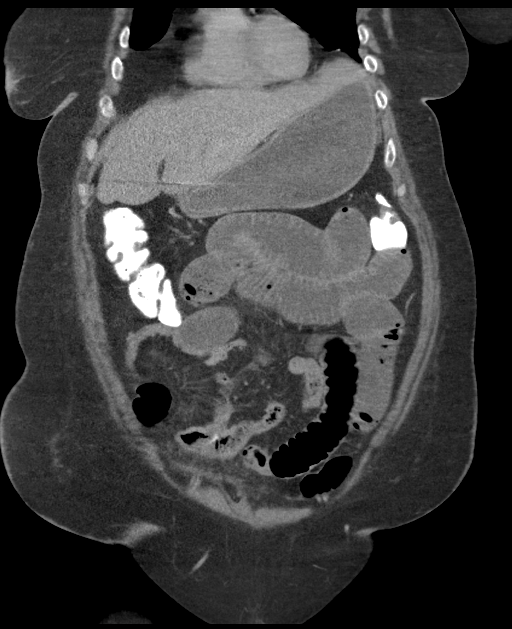
[im 45/101  soft-tissue]
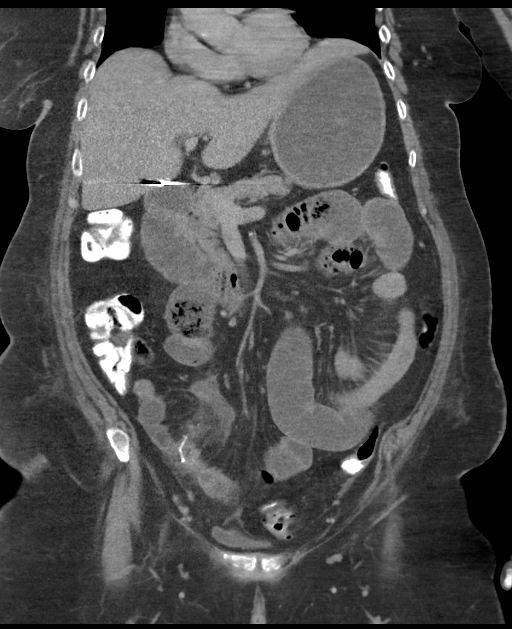
[im 56/101  soft-tissue]
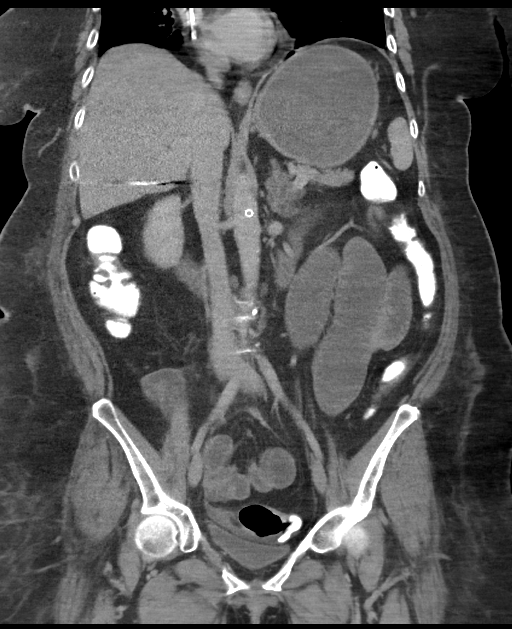

[16 of 46 positions shown; findings below may reference images not displayed]

FINDINGS: Lower chest: Lung bases are clear.

Hepatobiliary: No focal hepatic lesion. Postcholecystectomy. No
biliary dilatation.

Pancreas: Pancreas is normal. No ductal dilatation. No pancreatic
inflammation.

Spleen: Normal spleen

Adrenals/urinary tract: Thickening of the LEFT adrenal gland similar
prior. Kidneys normal. Ureters and bladder normal

Stomach/Bowel: Stomach is normal. The duodenum is fluid-filled. The
proximal small bowel is fluid-filled similar to comparison exam.
Small bowel loops are upper limits of normal in diameter at 3.7 cm
unchanged from prior. There is a bowel anastomosis in the RIGHT
lower quadrant with inflammation in the adjacent mesenteries. No
evidence of high-grade obstruction. There is contrast in the
ascending colon from small bowel follow-through 08/07/2017

Colon rectosigmoid colon normal.

Vascular/Lymphatic: Abdominal aorta is normal. Stent in the superior
mesenteric artery.

No adenopathy

Reproductive: Post hysterectomy

Other: Smaller free fluid the pelvis.

Midline the surgical wound without complication.

Musculoskeletal: No aggressive osseous lesion.
IMPRESSION: 1. Findings most suggestive of small-bowel ileus. No evidence of
high-grade obstruction. No pneumatosis or portal venous gas.
2. Contrast within the colon from prior barium study.
3. No acute findings otherwise.

## 2019-02-03 ENCOUNTER — Observation Stay
Admission: EM | Admit: 2019-02-03 | Discharge: 2019-02-04 | Disposition: A | Discharge status: Home / Self Care | Payer: Medicare Other | Attending: Internal Medicine | Admitting: Internal Medicine

## 2019-02-03 ENCOUNTER — Other Ambulatory Visit: Payer: Self-pay

## 2019-02-03 ENCOUNTER — Encounter: Payer: Self-pay | Admitting: Radiology

## 2019-02-03 ENCOUNTER — Emergency Department: Payer: Medicare Other

## 2019-02-03 DIAGNOSIS — T7840XA Allergy, unspecified, initial encounter: Secondary | ICD-10-CM | POA: Diagnosis present

## 2019-02-03 DIAGNOSIS — I110 Hypertensive heart disease with heart failure: Secondary | ICD-10-CM | POA: Diagnosis not present

## 2019-02-03 DIAGNOSIS — Z9049 Acquired absence of other specified parts of digestive tract: Secondary | ICD-10-CM | POA: Diagnosis not present

## 2019-02-03 DIAGNOSIS — Z96651 Presence of right artificial knee joint: Secondary | ICD-10-CM | POA: Insufficient documentation

## 2019-02-03 DIAGNOSIS — R079 Chest pain, unspecified: Secondary | ICD-10-CM | POA: Diagnosis present

## 2019-02-03 DIAGNOSIS — Z803 Family history of malignant neoplasm of breast: Secondary | ICD-10-CM | POA: Diagnosis not present

## 2019-02-03 DIAGNOSIS — Z881 Allergy status to other antibiotic agents status: Secondary | ICD-10-CM | POA: Insufficient documentation

## 2019-02-03 DIAGNOSIS — R7303 Prediabetes: Secondary | ICD-10-CM | POA: Diagnosis not present

## 2019-02-03 DIAGNOSIS — Z79899 Other long term (current) drug therapy: Secondary | ICD-10-CM | POA: Diagnosis not present

## 2019-02-03 DIAGNOSIS — Z8249 Family history of ischemic heart disease and other diseases of the circulatory system: Secondary | ICD-10-CM | POA: Diagnosis not present

## 2019-02-03 DIAGNOSIS — K219 Gastro-esophageal reflux disease without esophagitis: Secondary | ICD-10-CM | POA: Insufficient documentation

## 2019-02-03 DIAGNOSIS — F17210 Nicotine dependence, cigarettes, uncomplicated: Secondary | ICD-10-CM | POA: Diagnosis not present

## 2019-02-03 DIAGNOSIS — Z6841 Body Mass Index (BMI) 40.0 and over, adult: Secondary | ICD-10-CM | POA: Insufficient documentation

## 2019-02-03 DIAGNOSIS — I5032 Chronic diastolic (congestive) heart failure: Secondary | ICD-10-CM | POA: Insufficient documentation

## 2019-02-03 DIAGNOSIS — R0789 Other chest pain: Secondary | ICD-10-CM | POA: Diagnosis not present

## 2019-02-03 DIAGNOSIS — I482 Chronic atrial fibrillation, unspecified: Secondary | ICD-10-CM | POA: Insufficient documentation

## 2019-02-03 DIAGNOSIS — E785 Hyperlipidemia, unspecified: Secondary | ICD-10-CM | POA: Insufficient documentation

## 2019-02-03 DIAGNOSIS — Z85828 Personal history of other malignant neoplasm of skin: Secondary | ICD-10-CM | POA: Insufficient documentation

## 2019-02-03 DIAGNOSIS — R911 Solitary pulmonary nodule: Secondary | ICD-10-CM | POA: Diagnosis not present

## 2019-02-03 DIAGNOSIS — Z7901 Long term (current) use of anticoagulants: Secondary | ICD-10-CM | POA: Diagnosis not present

## 2019-02-03 LAB — COMPREHENSIVE METABOLIC PANEL
ALT: 15 U/L (ref 0–44)
AST: 14 U/L — ABNORMAL LOW (ref 15–41)
Albumin: 4 g/dL (ref 3.5–5.0)
Alkaline Phosphatase: 113 U/L (ref 38–126)
Anion gap: 15 (ref 5–15)
BUN: 27 mg/dL — ABNORMAL HIGH (ref 6–20)
CO2: 16 mmol/L — ABNORMAL LOW (ref 22–32)
Calcium: 9 mg/dL (ref 8.9–10.3)
Chloride: 103 mmol/L (ref 98–111)
Creatinine, Ser: 1.09 mg/dL — ABNORMAL HIGH (ref 0.44–1.00)
GFR calc Af Amer: >60 mL/min (ref >60)
GFR calc non Af Amer: 56 mL/min — ABNORMAL LOW (ref >60)
Glucose, Bld: 224 mg/dL — ABNORMAL HIGH (ref 70–99)
Potassium: 4.2 mmol/L (ref 3.5–5.1)
Sodium: 134 mmol/L — ABNORMAL LOW (ref 135–145)
Total Bilirubin: 0.7 mg/dL (ref 0.3–1.2)
Total Protein: 7.9 g/dL (ref 6.5–8.1)

## 2019-02-03 LAB — CBC WITH DIFFERENTIAL/PLATELET
Abs Immature Granulocytes: 0.06 10*3/uL (ref 0.00–0.07)
Basophils Absolute: 0 10*3/uL (ref 0.0–0.1)
Basophils Relative: 0 %
Eosinophils Absolute: 0 10*3/uL (ref 0.0–0.5)
Eosinophils Relative: 0 %
HCT: 49.6 % — ABNORMAL HIGH (ref 36.0–46.0)
Hemoglobin: 16.5 g/dL — ABNORMAL HIGH (ref 12.0–15.0)
Immature Granulocytes: 0 %
Lymphocytes Relative: 8 %
Lymphs Abs: 1.2 10*3/uL (ref 0.7–4.0)
MCH: 29.5 pg (ref 26.0–34.0)
MCHC: 33.3 g/dL (ref 30.0–36.0)
MCV: 88.6 fL (ref 80.0–100.0)
Monocytes Absolute: 0.3 10*3/uL (ref 0.1–1.0)
Monocytes Relative: 2 %
Neutro Abs: 13.3 10*3/uL — ABNORMAL HIGH (ref 1.7–7.7)
Neutrophils Relative %: 90 %
Platelets: 366 10*3/uL (ref 150–400)
RBC: 5.6 MIL/uL — ABNORMAL HIGH (ref 3.87–5.11)
RDW: 14 % (ref 11.5–15.5)
WBC: 14.9 10*3/uL — ABNORMAL HIGH (ref 4.0–10.5)
nRBC: 0 % (ref 0.0–0.2)

## 2019-02-03 LAB — TROPONIN I: Troponin I: <0.03 ng/mL (ref <0.03)

## 2019-02-03 MED ORDER — FERROUS SULFATE 325 (65 FE) MG PO TABS
325.0000 mg | ORAL_TABLET | Freq: Every day | ORAL | Status: DC
  Administered 2019-02-03 – 2019-02-04 (×2): 325 mg via ORAL
  Filled 2019-02-03 (×2): qty 1

## 2019-02-03 MED ORDER — PREDNISONE 20 MG PO TABS
20.0000 mg | ORAL_TABLET | Freq: Two times a day (BID) | ORAL | Status: DC
  Administered 2019-02-04: 20 mg via ORAL
  Filled 2019-02-03: qty 1

## 2019-02-03 MED ORDER — ATORVASTATIN CALCIUM 20 MG PO TABS
80.0000 mg | ORAL_TABLET | Freq: Every day | ORAL | Status: DC
  Administered 2019-02-03: 80 mg via ORAL
  Filled 2019-02-03: qty 4

## 2019-02-03 MED ORDER — CEPHALEXIN 500 MG PO CAPS
500.0000 mg | ORAL_CAPSULE | Freq: Three times a day (TID) | ORAL | Status: DC
  Administered 2019-02-03 – 2019-02-04 (×2): 500 mg via ORAL
  Filled 2019-02-03 (×2): qty 1

## 2019-02-03 MED ORDER — MORPHINE SULFATE (PF) 4 MG/ML IV SOLN
4.0000 mg | Freq: Once | INTRAVENOUS | Status: AC
  Administered 2019-02-03: 4 mg via INTRAVENOUS
  Filled 2019-02-03: qty 1

## 2019-02-03 MED ORDER — DIPHENHYDRAMINE HCL 50 MG/ML IJ SOLN
50.0000 mg | Freq: Once | INTRAMUSCULAR | Status: AC
  Administered 2019-02-03: 50 mg via INTRAVENOUS
  Filled 2019-02-03: qty 1

## 2019-02-03 MED ORDER — METOPROLOL TARTRATE 50 MG PO TABS
50.0000 mg | ORAL_TABLET | Freq: Two times a day (BID) | ORAL | Status: DC
  Administered 2019-02-03 – 2019-02-04 (×2): 50 mg via ORAL
  Filled 2019-02-03 (×2): qty 1

## 2019-02-03 MED ORDER — PROMETHAZINE HCL 25 MG/ML IJ SOLN
12.5000 mg | Freq: Once | INTRAMUSCULAR | Status: AC
  Administered 2019-02-03: 12.5 mg via INTRAVENOUS
  Filled 2019-02-03: qty 1

## 2019-02-03 MED ORDER — SULFAMETHOXAZOLE-TRIMETHOPRIM 800-160 MG PO TABS
1.0000 | ORAL_TABLET | Freq: Two times a day (BID) | ORAL | Status: DC
  Filled 2019-02-03: qty 1

## 2019-02-03 MED ORDER — METHYLPREDNISOLONE SODIUM SUCC 125 MG IJ SOLR
125.0000 mg | Freq: Once | INTRAMUSCULAR | Status: AC
  Administered 2019-02-03: 125 mg via INTRAVENOUS
  Filled 2019-02-03: qty 2

## 2019-02-03 MED ORDER — IOHEXOL 350 MG/ML SOLN
75.0000 mL | Freq: Once | INTRAVENOUS | Status: AC | PRN
  Administered 2019-02-03: 75 mL via INTRAVENOUS

## 2019-02-03 MED ORDER — AMLODIPINE BESYLATE 5 MG PO TABS
5.0000 mg | ORAL_TABLET | Freq: Once | ORAL | Status: AC
  Administered 2019-02-03: 5 mg via ORAL
  Filled 2019-02-03: qty 1

## 2019-02-03 MED ORDER — ONDANSETRON HCL 4 MG/2ML IJ SOLN
INTRAMUSCULAR | Status: AC
  Administered 2019-02-03: 4 mg via INTRAVENOUS
  Filled 2019-02-03: qty 2

## 2019-02-03 MED ORDER — ONDANSETRON HCL 4 MG PO TABS: 4.0000 mg | ORAL_TABLET | Freq: Three times a day (TID) | ORAL | Status: DC | PRN

## 2019-02-03 MED ORDER — CLOTRIMAZOLE-BETAMETHASONE 1-0.05 % EX CREA
1.0000 "application " | TOPICAL_CREAM | Freq: Two times a day (BID) | CUTANEOUS | Status: DC | PRN
  Filled 2019-02-03: qty 15

## 2019-02-03 MED ORDER — METOPROLOL TARTRATE 50 MG PO TABS
50.0000 mg | ORAL_TABLET | Freq: Once | ORAL | Status: AC
  Administered 2019-02-03: 50 mg via ORAL
  Filled 2019-02-03: qty 1

## 2019-02-03 MED ORDER — FAMOTIDINE 20 MG PO TABS: 20.0000 mg | ORAL_TABLET | Freq: Every day | ORAL | Status: DC

## 2019-02-03 MED ORDER — ONDANSETRON HCL 4 MG/2ML IJ SOLN: 4.0000 mg | Freq: Four times a day (QID) | INTRAMUSCULAR | Status: DC | PRN

## 2019-02-03 MED ORDER — SULFAMETHOXAZOLE-TRIMETHOPRIM 800-160 MG PO TABS
1.0000 | ORAL_TABLET | Freq: Two times a day (BID) | ORAL | Status: DC
  Administered 2019-02-03: 1 via ORAL
  Filled 2019-02-03 (×2): qty 1

## 2019-02-03 MED ORDER — AMLODIPINE BESYLATE 5 MG PO TABS
5.0000 mg | ORAL_TABLET | Freq: Every day | ORAL | Status: DC
  Administered 2019-02-04: 5 mg via ORAL
  Filled 2019-02-03: qty 1

## 2019-02-03 MED ORDER — POLYETHYLENE GLYCOL 3350 17 G PO PACK: 17.0000 g | PACK | Freq: Two times a day (BID) | ORAL | Status: DC | PRN

## 2019-02-03 MED ORDER — ONDANSETRON HCL 4 MG/2ML IJ SOLN
4.0000 mg | Freq: Once | INTRAMUSCULAR | Status: AC
  Administered 2019-02-03: 4 mg via INTRAVENOUS

## 2019-02-03 MED ORDER — NITROGLYCERIN 2 % TD OINT
1.0000 [in_us] | TOPICAL_OINTMENT | Freq: Once | TRANSDERMAL | Status: AC
  Administered 2019-02-03: 1 [in_us] via TOPICAL
  Filled 2019-02-03: qty 1

## 2019-02-03 MED ORDER — PANTOPRAZOLE SODIUM 40 MG PO TBEC
40.0000 mg | DELAYED_RELEASE_TABLET | Freq: Every day | ORAL | Status: DC
  Administered 2019-02-03 – 2019-02-04 (×2): 40 mg via ORAL
  Filled 2019-02-03 (×2): qty 1

## 2019-02-03 MED ORDER — ONDANSETRON 4 MG PO TBDP
ORAL_TABLET | ORAL | Status: AC
  Administered 2019-02-03: 4 mg via ORAL
  Filled 2019-02-03: qty 1

## 2019-02-03 MED ORDER — DULOXETINE HCL 30 MG PO CPEP
60.0000 mg | ORAL_CAPSULE | Freq: Every day | ORAL | Status: DC
  Administered 2019-02-03 – 2019-02-04 (×2): 60 mg via ORAL
  Filled 2019-02-03 (×2): qty 2

## 2019-02-03 MED ORDER — MORPHINE SULFATE (PF) 2 MG/ML IV SOLN
2.0000 mg | Freq: Once | INTRAVENOUS | Status: AC
  Administered 2019-02-03: 2 mg via INTRAVENOUS
  Filled 2019-02-03: qty 1

## 2019-02-03 MED ORDER — FAMOTIDINE 20 MG PO TABS
20.0000 mg | ORAL_TABLET | Freq: Every day | ORAL | Status: DC
  Administered 2019-02-04: 20 mg via ORAL
  Filled 2019-02-03: qty 1

## 2019-02-03 MED ORDER — MORPHINE SULFATE (PF) 2 MG/ML IV SOLN: 2.0000 mg | INTRAVENOUS | Status: DC | PRN

## 2019-02-03 MED ORDER — ONDANSETRON 4 MG PO TBDP
4.0000 mg | ORAL_TABLET | Freq: Once | ORAL | Status: AC
  Administered 2019-02-03: 05:00:00 4 mg via ORAL

## 2019-02-03 MED ORDER — APIXABAN 5 MG PO TABS
5.0000 mg | ORAL_TABLET | Freq: Two times a day (BID) | ORAL | Status: DC
  Administered 2019-02-03 – 2019-02-04 (×3): 5 mg via ORAL
  Filled 2019-02-03 (×3): qty 1

## 2019-02-03 MED ORDER — FAMOTIDINE IN NACL 20-0.9 MG/50ML-% IV SOLN
20.0000 mg | Freq: Once | INTRAVENOUS | Status: AC
  Administered 2019-02-03: 20 mg via INTRAVENOUS
  Filled 2019-02-03: qty 50

## 2019-02-03 MED ORDER — DIPHENHYDRAMINE HCL 25 MG PO CAPS
25.0000 mg | ORAL_CAPSULE | Freq: Four times a day (QID) | ORAL | Status: DC | PRN
  Administered 2019-02-03 – 2019-02-04 (×2): 25 mg via ORAL
  Filled 2019-02-03 (×2): qty 1

## 2019-02-03 MED ORDER — ACETAMINOPHEN 325 MG PO TABS
650.0000 mg | ORAL_TABLET | ORAL | Status: DC | PRN
  Administered 2019-02-03: 650 mg via ORAL
  Filled 2019-02-03: qty 2

## 2019-02-03 NOTE — H&P (Signed)
[facility] [place] NAME: Carla [name]    MR#:  [id number]  DATE OF BIRTH:  1961/08/19  DATE OF ADMISSION:  02/03/2019  PRIMARY CARE PHYSICIAN: Kirk Ruths, MD   REQUESTING/REFERRING PHYSICIAN:  Cinda Quest  CHIEF COMPLAINT:  Rash, chest pain  HISTORY OF PRESENT ILLNESS:  Carla [name]  is a 58 y.o. female with a known history of hypertension, hyperlipidemia, chronic congestive heart failure, chronic mesenteric ischemia, ischemic colitis status post colectomy and multiple other medical problems is presenting to the ED with a chief complaint of rash, shortness of breath with chest discomfort.  Patient has received IV Solu-Medrol, Benadryl and Pepcid in the ED and the rash resolved but patient was still having chest discomfort associated with exertional dyspnea.  Initial troponin is negative and hospitalist's team is called admit the patient to rule out chest pain.  Patient sees Dr. Vida Roller as an outpatient  PAST MEDICAL HISTORY:   Past Medical History:  Diagnosis Date  . Abdominal aortic atherosclerosis ([place])    a. 05/2017 CTA abd/pelvis: significant atherosclerotic dzs of the infrarenal abd Ao w/ some mural thrombus. No aneurysm or dissection.  . Abdominal pain   . Acute focal ischemia of small intestine (H[facility])   . Acute right-sided low back pain with right-sided sciatica 06/13/2017  . AKI (acute kidney injury) ([place]) 07/29/2017  . Baker's cyst of knee, right    a. 07/2016 U/S: 4.1 x 1.4 x 2.9 cystic structure in R poplitetal fossa.  . Bell's palsy   . Borderline diabetes   . Cancer ([place])    skin cancer on nose  . Chest pain    a. 08/2012 Lexiscan MV: EF 54%, non ischemia/infarct.  . CHF (congestive heart failure) ([place])   . Chronic idiopathic constipation   . Chronic mesenteric ischemia ([place])   . Diastolic dysfunction    a. 05/2017 Echo: Ef 60-65%, no rwma, Gr1 DD, no source of cardiac emboli.  . 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 05/2017 CTA Abd/pelvis: apparent thrombus or embolus in prox SMA (70-90%); b. 05/2017 catheter directed tPA, mechanical thrombectomy, and stenting of the SMA.  . Essential hypertension with goal blood pressure less than 140/90 01/19/2016  . Gastroesophageal reflux disease 01/19/2016  . GERD (gastroesophageal reflux disease)   . History of kidney stones   . Hyperlipidemia   . Hypertension   . Hypotension 07/29/2017  . Morbid obesity with BMI of 40.0-44.9, adult ([place])   . Occlusive mesenteric ischemia ([place]) 06/19/2017  . Personal history of other malignant neoplasm of skin 06/01/2016  . Primary osteoarthritis of both knees 06/13/2017  . Recurrent major depressive disorder ([place]) 01/19/2016  . SBO (small bowel obstruction) ([place]) 08/06/2017  . 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 Urinary tract infection 01/09/2014    PAST SURGICAL HISTOIRY:   Past Surgical History:  Procedure Laterality Date  . APPENDECTOMY    . CHOLECYSTECTOMY    . LAPAROTOMY N/A 08/08/2017   Procedure: EXPLORATORY LAPAROTOMY POSSIBLE BOWEL RESECTION;  Surgeon: Jules Husbands, MD;  Location: [facility];  Service: General;  Laterality: N/A;  . TEE WITHOUT CARDIOVERSION N/A 06/22/2017   Procedure: TRANSESOPHAGEAL ECHOCARDIOGRAM (TEE);  Surgeon: Wellington Hampshire, MD;  Location: [facility] OR[facility];  Service: Cardiovascular;  Laterality: N/A;  . TOTAL KNEE ARTHROPLASTY Right 07/11/2018   Procedure: TOTAL KNEE ARTHROPLASTY;  Surgeon: Corky Mull, MD;  Location: [facility];  Service: Orthopedics;  Laterality: Right;  .  VAGINAL HYSTERECTOMY    . VISCERAL ARTERY INTERVENTION N/A 06/20/2017   Procedure: Visceral Artery Intervention, possible aortic thrombectomy;  Surgeon: Algernon Huxley, MD;  Location: [facility];  Service: Cardiovascular;  Laterality: N/A;  . VISCERAL ARTERY INTERVENTION N/A 01/28/2018   Procedure: VISCERAL ARTERY INTERVENTION;  Surgeon: Algernon Huxley, MD;  Location: [facility];  Service:  Cardiovascular;  Laterality: N/A;    SOCIAL HISTORY:   Social History   Tobacco Use  . Smoking status: Current Every Day Smoker    Packs/day: 0.50    Types: Cigarettes  . Smokeless tobacco: Never Used  Substance Use Topics  . Alcohol use: No    FAMILY HISTORY:   Family History  Problem Relation Age of Onset  . Hypertension Mother   . Heart disease Mother   . Heart attack Mother   . Breast cancer Mother   . Hypertension Father   . Breast cancer Paternal Aunt     DRUG ALLERGIES:  No Known Allergies  REVIEW OF SYSTEMS:  CONSTITUTIONAL: No fever, fatigue or weakness.  EYES: No blurred or double vision.  EARS, NOSE, AND THROAT: No tinnitus or ear pain.  RESPIRATORY: No cough, shortness of breath, wheezing or hemoptysis.  CARDIOVASCULAR: chest pain, dyspnea with exertion, denies orthopnea, edema.  GASTROINTESTINAL: No nausea, vomiting, diarrhea or abdominal pain.  GENITOURINARY: No dysuria, hematuria.  ENDOCRINE: No polyuria, nocturia,  HEMATOLOGY: No anemia, easy bruising or bleeding SKIN: No rash or lesion. MUSCULOSKELETAL: No joint pain or arthritis.   NEUROLOGIC: No tingling, numbness, weakness.  PSYCHIATRY: No anxiety or depression.   MEDICATIONS AT HOME:   Prior to Admission medications   Medication Sig Start Date End Date Taking? Authorizing Provider  amLODipine (NORVASC) 5 MG tablet Take 1 tablet (5 mg total) by mouth daily. 04/10/18  Yes Gladstone Lighter, MD  apixaban (ELIQUIS) 5 MG TABS tablet Take 5 mg by mouth 2 (two) times daily.   Yes [provider]  atorvastatin (LIPITOR) 80 MG tablet Take 80 mg by mouth at bedtime.    Yes [provider]  clotrimazole-betamethasone (LOTRISONE) cream Apply 1 application topically 2 (two) times daily as needed (for dry skin).    Yes [provider]  DULoxetine (CYMBALTA) 60 MG capsule Take 60 mg by mouth daily.   Yes [provider]  ferrous sulfate 325 (65 FE) MG tablet Take 325 mg  by mouth daily.   Yes [provider]  metoprolol tartrate (LOPRESSOR) 50 MG tablet Take 1 tablet (50 mg total) by mouth 2 (two) times daily. 06/15/18  Yes Mody, Ulice Bold, MD  omeprazole (PRILOSEC) 20 MG capsule Take 20 mg by mouth 2 (two) times daily before a meal.   Yes [provider]  ondansetron (ZOFRAN) 4 MG tablet Take 1 tablet (4 mg total) by mouth every 8 (eight) hours as needed. Patient taking differently: Take 4 mg by mouth every 8 (eight) hours as needed for nausea or vomiting.  11/10/18  Yes Nance Pear, MD  polyethylene glycol [facility] / Floria Raveling) packet Take 17 g by mouth 2 (two) times daily as needed for mild constipation or moderate constipation.    Yes [provider]  sulfamethoxazole-trimethoprim (BACTRIM DS,SEPTRA DS) 800-160 MG tablet Take 1 tablet by mouth 2 (two) times daily. 01/27/19 02/03/19 Yes [provider]  dicyclomine (BENTYL) 20 MG tablet Take 1 tablet (20 mg total) by mouth 3 (three) times daily as needed (abdominal pain). Patient not taking: Reported on 02/03/2019 11/10/18  Nance Pear, MD      VITAL SIGNS:  Blood pressure 133/78, pulse 69, temperature 98.1 F (36.7 C), temperature source Oral, resp. rate 16, height 5\' 2"  (1.575 m), weight 90.7 kg, SpO2 98 %.  PHYSICAL EXAMINATION:  GENERAL:  58 y.o.-year-old patient lying in the bed with no acute distress.  EYES: Pupils equal, round, reactive to light and accommodation. No scleral icterus. Extraocular muscles intact.  HEENT: Head atraumatic, normocephalic. Oropharynx and nasopharynx clear.  NECK:  Supple, no jugular venous distention. No thyroid enlargement, no tenderness.  LUNGS: Normal breath sounds bilaterally, no wheezing, rales,rhonchi or crepitation. No use of accessory muscles of respiration.  CARDIOVASCULAR: S1, S2 normal. No murmurs, rubs, or gallops.  ABDOMEN: Soft, nontender, nondistended. Bowel sounds present. No organomegaly or mass.  EXTREMITIES: No pedal  edema, cyanosis, or clubbing.  NEUROLOGIC: Awake, alert and oriented x3 sensation intact. Gait not checked.  PSYCHIATRIC: The patient is alert and oriented x 3.  SKIN: No obvious rash, lesion, or ulcer.   LABORATORY PANEL:   CBC Recent Labs  Lab 02/03/19 0507  WBC 14.9*  HGB 16.5*  HCT 49.6*  PLT 366   ------------------------------------------------------------------------------------------------------------------  Chemistries  Recent Labs  Lab 02/03/19 0507  NA 134*  K 4.2  CL 103  CO2 16*  GLUCOSE 224*  BUN 27*  CREATININE 1.09*  CALCIUM 9.0  AST 14*  ALT 15  ALKPHOS 113  BILITOT 0.7   ------------------------------------------------------------------------------------------------------------------  Cardiac Enzymes Recent Labs  Lab 02/03/19 0507  TROPONINI <0.03   ------------------------------------------------------------------------------------------------------------------  RADIOLOGY:  Dg Chest Portable 1 View  Result Date: 02/03/2019 CLINICAL DATA:  Chest pain.  Shortness of breath. EXAM: PORTABLE CHEST 1 VIEW COMPARISON:  Two-view chest x-ray 09/19/2018 FINDINGS: The heart size is normal. There is no edema or effusion. No focal airspace disease is present. The visualized soft tissues and bony thorax are unremarkable. IMPRESSION: No acute cardiopulmonary disease. Electronically Signed   By: San Morelle M.D.   On: 02/03/2019 05:38   Ct Angio Chest Aorta W And/or Wo Contrast  Result Date: 02/03/2019 CLINICAL DATA:  Hives. Severe hypertension. Epigastric and chest pain extending to the back. EXAM: CT ANGIOGRAPHY CHEST WITH CONTRAST TECHNIQUE: Multidetector CT imaging of the chest was performed using the standard protocol during bolus administration of intravenous contrast. Multiplanar CT image reconstructions and MIPs were obtained to evaluate the vascular anatomy. CONTRAST:  59mL OMNIPAQUE IOHEXOL 350 MG/ML SOLN COMPARISON:  CTA chest 04/07/2018  FINDINGS: Cardiovascular: Heart size is normal. Atherosclerotic calcifications are present within the coronary arteries. No displaced calcifications are present at the aortic arch. CTA demonstrates normal caliber of the aortic arch and descending thoracic aorta with some atherosclerotic change. There is no dissection. A 3 vessel arch configuration is present. There is some calcification at the origins of the great vessels without stenosis or vascular injury. Mediastinum/Nodes: No significant mediastinal, hilar, or axillary adenopathy is present. Thoracic inlet is within normal limits. Lungs/Pleura: Previously noted posterior right pulmonary nodule has increased in size, now measuring 8 mm. A 3 mm right upper lobe nodule is present. Mild dependent atelectasis is present. The lungs are otherwise clear. No significant pleural effusions are present. Upper Abdomen: The patient is status post cholecystectomy. Left adrenal adenoma is stable. No other solid organ lesions are present. There is no significant adenopathy. Musculoskeletal: Vertebral body heights and alignment are maintained. Mild endplate changes are present. No focal lytic or blastic lesions are present. The ribs are unremarkable. Review of the MIP images confirms the  above findings. IMPRESSION: 1. No acute abnormality of the aorta or pulmonary arteries. 2. Atherosclerosis without aneurysm. 3. Increasing size of posterior right pulmonary nodule best seen on image 70 of series 7. Non-contrast chest CT at 6-12 months is recommended. If the nodule is stable at time of repeat CT, then future CT at 18-24 months (from today's scan) is considered optional for low-risk patients, but is recommended for high-risk patients. This recommendation follows the consensus statement: Guidelines for Management of Incidental Pulmonary Nodules Detected on CT Images: From the Fleischner Society 2017; Radiology 2017; 284:228-243. 4. No other acute or focal abnormality to explain the  patient's chest pain. Electronically Signed   By: San Morelle M.D.   On: 02/03/2019 06:36    EKG:   Orders placed or performed during the hospital encounter of 02/03/19  . EKG 12-Lead  . EKG 12-Lead  . Repeat EKG  . Repeat EKG    IMPRESSION AND PLAN:     #Chest pain  Admitted telemetry Cycle cardiac biomarkers Recent echocardiogram in August 2019 has revealed 60 to 65% ejection fraction Cardiology consult placed and notified Dr. Clayborn Bigness and Dr. Nehemiah Massed , both are aware the consult CTA chest negative for pulmonary embolism  #Hives Status post IV Solu-Medrol, Pepcid, Benadryl Clinically improving Continue patient with p.o. prednisone, Pepcid and Benadryl as needed  #Chronic atrial fibrillation no RVR Continue Eliquis her home medication  #Hypertension-continue home medication Norvasc and metoprolol  #Hyperlipidemia Lipitor  #GERD PPI  #Chronic diastolic congestive heart failure no exacerbation not fluid overloaded monitor for symptoms and signs of fluid overload  #Pulmonary nodule repeat CT chest in 6 months  All the records are reviewed and case discussed with ED provider. Management plans discussed with the patient, family and they are in agreement.  CODE STATUS: fc   TOTAL TIME TAKING CARE OF THIS PATIENT: 45  minutes.   Note: This dictation was prepared with Dragon dictation along with smaller phrase technology. Any transcriptional errors that result from this process are unintentional.  Nicholes Mango M.D on 02/03/2019 at 4:00 PM  Between 7am to 6pm - Pager - [phone]  After 6pm go to [email or web address] - password [email or web address] [facility]  [facility]  Office  [phone]  CC: Primary care physician; Kirk Ruths, MD

## 2019-02-03 NOTE — Progress Notes (Signed)
Pt complaining of itching, states that this has been happening ever since she's been on bactrim for her UTI. Benadryl has been given & pt it due to get bactrim again at 2200. MD paged, Dr. Darvin Neighbours to D/C bactrim & order keflex instead. Will continue to monitor. Conley Simmonds, RN, BSN

## 2019-02-03 NOTE — ED Notes (Signed)
ED TO INPATIENT HANDOFF REPORT  ED Nurse Name and Phone #: Tessie Fass [phone]  S Name/Age/Gender Carla [name] 58 y.o. female Room/Bed: ED11A/ED11A  Code Status   Code Status: Full Code  Home/SNF/Other Home Patient oriented to: self, place, time and situation Is this baseline? Yes   Triage Complete: Triage complete  Chief Complaint chest pain  Triage Note Pt arrives to ED from home via [facility] with c/c of chest pain, SoB, NVD, and hives. Sx onset yesterday. Transport vitals reported as 216/120, p100, Sinus tachycardia, 88% on room air. Pt given 162 aspirin given before patient refused more. Pt has Hx of CHF, COPD, A-fib, and HTN. Pt A&Ox4   Allergies No Known Allergies  Level of Care/Admitting Diagnosis ED Disposition    ED Disposition Condition [facility] Area: [facility] [100120]  Level of Care: Telemetry [5]  Diagnosis: Chest pain [426834]  Admitting Physician: Nicholes Mango [[id number]]  Attending Physician: Nicholes Mango [[id number]]  Bed request comments: 2a  PT Class (Do Not Modify): Observation [104]  PT Acc Code (Do Not Modify): Observation [10022]       B Medical/Surgery History Past Medical History:  Diagnosis Date  . Abdominal aortic atherosclerosis ([place])    a. 05/2017 CTA abd/pelvis: significant atherosclerotic dzs of the infrarenal abd Ao w/ some mural thrombus. No aneurysm or dissection.  . Abdominal pain   . Acute focal ischemia of small intestine (HCC)   . Acute right-sided low back pain with right-sided sciatica 06/13/2017  . AKI (acute kidney injury) ([place]) 07/29/2017  . Baker's cyst of knee, right    a. 07/2016 U/S: 4.1 x 1.4 x 2.9 cystic structure in R poplitetal fossa.  . Bell's palsy   . Borderline diabetes   . Cancer ([place])    skin cancer on nose  . Chest pain    a. 08/2012 Lexiscan MV: EF 54%, non ischemia/infarct.  . CHF (congestive heart failure) ([place])   . Chronic idiopathic constipation   . Chronic mesenteric  ischemia ([place])   . Diastolic dysfunction    a. 05/2017 Echo: Ef 60-65%, no rwma, Gr1 DD, no source of cardiac emboli.  . 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 05/2017 CTA Abd/pelvis: apparent thrombus or embolus in prox SMA (70-90%); b. 05/2017 catheter directed tPA, mechanical thrombectomy, and stenting of the SMA.  . Essential hypertension with goal blood pressure less than 140/90 01/19/2016  . Gastroesophageal reflux disease 01/19/2016  . GERD (gastroesophageal reflux disease)   . History of kidney stones   . Hyperlipidemia   . Hypertension   . Hypotension 07/29/2017  . Morbid obesity with BMI of 40.0-44.9, adult ([place])   . Occlusive mesenteric ischemia ([place]) 06/19/2017  . Personal history of other malignant neoplasm of skin 06/01/2016  . Primary osteoarthritis of both knees 06/13/2017  . Recurrent major depressive disorder ([place]) 01/19/2016  . SBO (small bowel obstruction) ([place]) 08/06/2017  . 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 Urinary tract infection 01/09/2014   Past Surgical History:  Procedure Laterality Date  . APPENDECTOMY    . CHOLECYSTECTOMY    . LAPAROTOMY N/A 08/08/2017   Procedure: EXPLORATORY LAPAROTOMY POSSIBLE BOWEL RESECTION;  Surgeon: Jules Husbands, MD;  Location: [facility] ORS;  Service: General;  Laterality: N/A;  . TEE WITHOUT CARDIOVERSION N/A 06/22/2017   Procedure: TRANSESOPHAGEAL ECHOCARDIOGRAM (TEE);  Surgeon: Wellington Hampshire, MD;  Location: [facility] ORS;  Service: Cardiovascular;  Laterality: N/A;  . TOTAL KNEE  ARTHROPLASTY Right 07/11/2018   Procedure: TOTAL KNEE ARTHROPLASTY;  Surgeon: Corky Mull, MD;  Location: [facility] ORS;  Service: Orthopedics;  Laterality: Right;  [name] VAGINAL HYSTERECTOMY    . VISCERAL ARTERY INTERVENTION N/A 06/20/2017   Procedure: Visceral Artery Intervention, possible aortic thrombectomy;  Surgeon: Algernon Huxley, MD;  Location: [place] [facility];  Service: Cardiovascular;  Laterality: N/A;  . VISCERAL ARTERY INTERVENTION N/A  01/28/2018   Procedure: VISCERAL ARTERY INTERVENTION;  Surgeon: Algernon Huxley, MD;  Location: [place] [facility];  Service: Cardiovascular;  Laterality: N/A;     A IV Location/Drains/Wounds Patient Lines/Drains/Airways Status   Active Line/Drains/Airways    Name:   Placement date:   Placement time:   Site:   Days:   Peripheral IV 02/03/19 Left Antecubital   02/03/19    0500    Antecubital   less than 1   Incision (Closed) 07/11/18 Knee Right   07/11/18    0937     207          Intake/Output Last 24 hours  Intake/Output Summary (Last 24 hours) at 02/03/2019 0909 Last data filed at 02/03/2019 0534 Gross per 24 hour  Intake 50 ml  Output -  Net 50 ml    Labs/Imaging Results for orders placed or performed during the hospital encounter of 02/03/19 (from the past 48 hour(s))  Comprehensive metabolic panel     Status: Abnormal   Collection Time: 02/03/19  5:07 AM  Result Value Ref Range   Sodium 134 (L) 135 - 145 mmol/L   Potassium 4.2 3.5 - 5.1 mmol/L   Chloride 103 98 - 111 mmol/L   CO2 16 (L) 22 - 32 mmol/L   Glucose, Bld 224 (H) 70 - 99 mg/dL   BUN 27 (H) 6 - 20 mg/dL   Creatinine, Ser 1.09 (H) 0.44 - 1.00 mg/dL   Calcium 9.0 8.9 - 10.3 mg/dL   Total Protein 7.9 6.5 - 8.1 g/dL   Albumin 4.0 3.5 - 5.0 g/dL   AST 14 (L) 15 - 41 U/L   ALT 15 0 - 44 U/L   Alkaline Phosphatase 113 38 - 126 U/L   Total Bilirubin 0.7 0.3 - 1.2 mg/dL   GFR calc non Af Amer 56 (L) >60 mL/min   GFR calc Af Amer >60 >60 mL/min   Anion gap 15 5 - 15    Comment: Performed at [facility], [address] [place], [place] [address]  Troponin I - Once     Status: None   Collection Time: 02/03/19  5:07 AM  Result Value Ref Range   Troponin I <0.03 <0.03 ng/mL    Comment: Performed at [facility], [address] [place], [place] [address]  CBC with Differential     Status: Abnormal   Collection Time: 02/03/19  5:07 AM  Result Value Ref Range   WBC 14.9 (H) 4.0 - 10.5 K/uL    RBC 5.60 (H) 3.87 - 5.11 MIL/uL   Hemoglobin 16.5 (H) 12.0 - 15.0 g/dL   HCT 49.6 (H) 36.0 - 46.0 %   MCV 88.6 80.0 - 100.0 fL   MCH 29.5 26.0 - 34.0 pg   MCHC 33.3 30.0 - 36.0 g/dL   RDW 14.0 11.5 - 15.5 %   Platelets 366 150 - 400 K/uL   nRBC 0.0 0.0 - 0.2 %   Neutrophils Relative % 90 %   Neutro Abs 13.3 (H) 1.7 - 7.7 K/uL   Lymphocytes  Relative 8 %   Lymphs Abs 1.2 0.7 - 4.0 K/uL   Monocytes Relative 2 %   Monocytes Absolute 0.3 0.1 - 1.0 K/uL   Eosinophils Relative 0 %   Eosinophils Absolute 0.0 0.0 - 0.5 K/uL   Basophils Relative 0 %   Basophils Absolute 0.0 0.0 - 0.1 K/uL   Immature Granulocytes 0 %   Abs Immature Granulocytes 0.06 0.00 - 0.07 K/uL    Comment: Performed at [facility], [address], [place], [place] [address]   Dg Chest Portable 1 View  Result Date: 02/03/2019 CLINICAL DATA:  Chest pain.  Shortness of breath. EXAM: PORTABLE CHEST 1 VIEW COMPARISON:  Two-view chest x-ray 09/19/2018 FINDINGS: The heart size is normal. There is no edema or effusion. No focal airspace disease is present. The visualized soft tissues and bony thorax are unremarkable. IMPRESSION: No acute cardiopulmonary disease. Electronically Signed   By: San Morelle M.D.   On: 02/03/2019 05:38   Ct Angio Chest Aorta W And/or Wo Contrast  Result Date: 02/03/2019 CLINICAL DATA:  Hives. Severe hypertension. Epigastric and chest pain extending to the back. EXAM: CT ANGIOGRAPHY CHEST WITH CONTRAST TECHNIQUE: Multidetector CT imaging of the chest was performed using the standard protocol during bolus administration of intravenous contrast. Multiplanar CT image reconstructions and MIPs were obtained to evaluate the vascular anatomy. CONTRAST:  54mL OMNIPAQUE IOHEXOL 350 MG/ML SOLN COMPARISON:  CTA chest 04/07/2018 FINDINGS: Cardiovascular: Heart size is normal. Atherosclerotic calcifications are present within the coronary arteries. No displaced calcifications are present at the aortic  arch. CTA demonstrates normal caliber of the aortic arch and descending thoracic aorta with some atherosclerotic change. There is no dissection. A 3 vessel arch configuration is present. There is some calcification at the origins of the great vessels without stenosis or vascular injury. Mediastinum/Nodes: No significant mediastinal, hilar, or axillary adenopathy is present. Thoracic inlet is within normal limits. Lungs/Pleura: Previously noted posterior right pulmonary nodule has increased in size, now measuring 8 mm. A 3 mm right upper lobe nodule is present. Mild dependent atelectasis is present. The lungs are otherwise clear. No significant pleural effusions are present. Upper Abdomen: The patient is status post cholecystectomy. Left adrenal adenoma is stable. No other solid organ lesions are present. There is no significant adenopathy. Musculoskeletal: Vertebral body heights and alignment are maintained. Mild endplate changes are present. No focal lytic or blastic lesions are present. The ribs are unremarkable. Review of the MIP images confirms the above findings. IMPRESSION: 1. No acute abnormality of the aorta or pulmonary arteries. 2. Atherosclerosis without aneurysm. 3. Increasing size of posterior right pulmonary nodule best seen on image 70 of series 7. Non-contrast chest CT at 6-12 months is recommended. If the nodule is stable at time of repeat CT, then future CT at 18-24 months (from today's scan) is considered optional for low-risk patients, but is recommended for high-risk patients. This recommendation follows the consensus statement: Guidelines for Management of Incidental Pulmonary Nodules Detected on CT Images: From the Fleischner Society 2017; Radiology 2017; 284:228-243. 4. No other acute or focal abnormality to explain the patient's chest pain. Electronically Signed   By: San Morelle M.D.   On: 02/03/2019 06:36    Pending Labs Unresulted Labs (From admission, onward)    Start      Ordered   02/03/19 0837  HIV antibody (Routine Testing)  Once,   STAT     02/03/19 0839          Vitals/Pain Today's  Vitals   02/03/19 0730 02/03/19 0800 02/03/19 0814 02/03/19 0830  BP: (!) 156/96 134/89  137/86  Pulse: (!) 102 (!) 111  72  Resp: (!) 22 20  16   SpO2: 96% 97%  95%  Weight:      Height:      PainSc:   3      Isolation Precautions No active isolations  Medications Medications  acetaminophen (TYLENOL) tablet 650 mg (has no administration in time range)  ondansetron (ZOFRAN) injection 4 mg (has no administration in time range)  morphine 2 MG/ML injection 2 mg (has no administration in time range)  diphenhydrAMINE (BENADRYL) capsule 25 mg (has no administration in time range)  predniSONE (DELTASONE) tablet 20 mg (has no administration in time range)  famotidine (PEPCID) tablet 20 mg (has no administration in time range)  diphenhydrAMINE (BENADRYL) injection 50 mg (50 mg Intravenous Given 02/03/19 0502)  famotidine (PEPCID) IVPB 20 mg premix (0 mg Intravenous Stopped 02/03/19 0534)  methylPREDNISolone sodium succinate (SOLU-MEDROL) 125 mg/2 mL injection 125 mg (125 mg Intravenous Given 02/03/19 0503)  nitroGLYCERIN (NITROGLYN) 2 % ointment 1 inch (1 inch Topical Given 02/03/19 0512)  ondansetron (ZOFRAN-ODT) disintegrating tablet 4 mg (4 mg Oral Given 02/03/19 0507)  ondansetron (ZOFRAN) injection 4 mg (4 mg Intravenous Given 02/03/19 0522)  promethazine (PHENERGAN) injection 12.5 mg (12.5 mg Intravenous Given 02/03/19 0535)  morphine 4 MG/ML injection 4 mg (4 mg Intravenous Given 02/03/19 0536)  iohexol (OMNIPAQUE) 350 MG/ML injection 75 mL (75 mLs Intravenous Contrast Given 02/03/19 0617)  morphine 2 MG/ML injection 2 mg (2 mg Intravenous Given 02/03/19 0728)  amLODipine (NORVASC) tablet 5 mg (5 mg Oral Given 02/03/19 0725)  metoprolol tartrate (LOPRESSOR) tablet 50 mg (50 mg Oral Given 02/03/19 0726)    Mobility walks Low fall risk   Focused Assessments Cardiac  Assessment Handoff:  Cardiac Rhythm: Normal sinus rhythm Lab Results  Component Value Date   CKTOTAL 37 12/22/2012   CKMB < 0.5 (L) 12/22/2012   TROPONINI <0.03 02/03/2019   No results found for: DDIMER Does the Patient currently have chest pain? No     R Recommendations: See Admitting Provider Note  Report given to:   Additional Notes:

## 2019-02-03 NOTE — ED Notes (Signed)
Patient AAOX4. Vitals Stable. NAD.

## 2019-02-03 NOTE — ED Triage Notes (Signed)
Pt arrives to ED from home via [facility] with c/c of chest pain, SoB, NVD, and hives. Sx onset yesterday. Transport vitals reported as 216/120, p100, Sinus tachycardia, 88% on room air. Pt given 162 aspirin given before patient refused more. Pt has Hx of CHF, COPD, A-fib, and HTN. Pt A&Ox4

## 2019-02-03 NOTE — Progress Notes (Signed)
Family Meeting Note  Advance Directive:yes  Today a meeting took place with the Patient.    The following clinical team members were present during this meeting:MD  The following were discussed:Patient's diagnosis: Chest pain, hives, history of atrial fibrillation on Eliquis, hypertension, hyperlipidemia, GERD, treatment and of care discussed in detail with the patient.  She verbalized understanding of the plan  , Patient's progosis: Unable to determine and Goals for treatment: Full Code  -2 daughters are healthcare POA  Additional follow-up to be provided: Hospitalist and cardiology  Time spent during discussion:17 min  Nicholes Mango, MD

## 2019-02-03 NOTE — ED Provider Notes (Addendum)
[facility] Emergency Department Provider Note   ____________________________________________   First MD Initiated Contact with Patient 02/03/19 [phone]     (approximate)  I have reviewed the triage vital signs and the nursing notes.   HISTORY  Chief Complaint Chest Pain; Shortness of Breath; Emesis; and Nausea    HPI Carla [name] is a 58 y.o. female who reports she was laying in bed around about 7:00 began to get itching all over and hives.  Then she developed chest pain and some shortness of breath.  The pain is pressure in the low chest epigastric area it is now radiating through to the back.  She says it feels like somebody sitting on her.  Nothing seems to make it better or worse.  She is not had any of this before.  Pain is moderate.  Goes along with nausea.         Past Medical History:  Diagnosis Date   Abdominal aortic atherosclerosis ([place])    a. 05/2017 CTA abd/pelvis: significant atherosclerotic dzs of the infrarenal abd Ao w/ some mural thrombus. No aneurysm or dissection.   Abdominal pain    Acute focal ischemia of small intestine (HCC)    Acute right-sided low back pain with right-sided sciatica 06/13/2017   AKI (acute kidney injury) ([place]) 07/29/2017   Baker's cyst of knee, right    a. 07/2016 U/S: 4.1 x 1.4 x 2.9 cystic structure in R poplitetal fossa.   Bell's palsy    Borderline diabetes    Cancer ([place])    skin cancer on nose   Chest pain    a. 08/2012 Lexiscan MV: EF 54%, non ischemia/infarct.   CHF (congestive heart failure) (HCC)    Chronic idiopathic constipation    Chronic mesenteric ischemia (HCC)    Diastolic dysfunction    a. 05/2017 Echo: Ef 60-65%, no rwma, Gr1 DD, no source of cardiac emboli.   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 05/2017 CTA Abd/pelvis: apparent thrombus or embolus in prox SMA (70-90%); b. 05/2017 catheter directed tPA, mechanical thrombectomy, and stenting of the SMA.    Essential hypertension with goal blood pressure less than 140/90 01/19/2016   Gastroesophageal reflux disease 01/19/2016   GERD (gastroesophageal reflux disease)    History of kidney stones    Hyperlipidemia    Hypertension    Hypotension 07/29/2017   Morbid obesity with BMI of 40.0-44.9, adult ([place])    Occlusive mesenteric ischemia ([place]) 06/19/2017   Personal history of other malignant neoplasm of skin 06/01/2016   Primary osteoarthritis of both knees 06/13/2017   Recurrent major depressive disorder ([place]) 01/19/2016   SBO (small bowel obstruction) ([place]) 08/06/2017   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HCC)    Urinary tract infection 01/09/2014    Patient Active Problem List   Diagnosis Date Noted   Status post total knee replacement using cement, right 07/11/2018   PAF (paroxysmal atrial fibrillation) ([place]) 06/15/2018   Accelerated hypertension 04/08/2018   Acute encephalopathy 04/08/2018   Adjustment disorder with anxiety 08/15/2017   Chronic idiopathic constipation    Protein-calorie malnutrition, severe 08/08/2017   SBO (small bowel obstruction) ([place]) 08/06/2017   Chronic mesenteric ischemia (HCC)    AKI (acute kidney injury) ([place]) 07/29/2017   Hypotension 07/29/2017   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HCC)    Abdominal pain    Occlusive mesenteric ischemia ([place]) 06/19/2017   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[place])    Acute focal ischemia of small intestine (  [place])    Acute right-sided low back pain with right-sided sciatica 06/13/2017   Primary osteoarthritis of both knees 06/13/2017   Pain syndrome, chronic 09/13/2016   Morbid obesity with BMI of 40.0-44.9, adult ([place]) 07/16/2016   Personal history of other malignant neoplasm of skin 06/01/2016   Chronic pain of both knees 01/19/2016   HTN (hypertension) 01/19/2016   Gastroesophageal reflux disease 01/19/2016   Hyperlipidemia 01/19/2016   Prediabetes 01/19/2016   Recurrent  major depressive disorder ([place]) 01/19/2016   Urinary tract infection 01/09/2014   Chronic pelvic pain in female 11/14/2013   Mixed stress and urge urinary incontinence 11/14/2013   Chest pain 08/06/2012    Past Surgical History:  Procedure Laterality Date   APPENDECTOMY     CHOLECYSTECTOMY     LAPAROTOMY N/A 08/08/2017   Procedure: EXPLORATORY LAPAROTOMY POSSIBLE BOWEL RESECTION;  Surgeon: Jules Husbands, MD;  Location: [facility] ORS;  Service: General;  Laterality: N/A;   TEE WITHOUT CARDIOVERSION N/A 06/22/2017   Procedure: TRANSESOPHAGEAL ECHOCARDIOGRAM (TEE);  Surgeon: Wellington Hampshire, MD;  Location: [facility] ORS;  Service: Cardiovascular;  Laterality: N/A;   TOTAL KNEE ARTHROPLASTY Right 07/11/2018   Procedure: TOTAL KNEE ARTHROPLASTY;  Surgeon: Corky Mull, MD;  Location: [facility] ORS;  Service: Orthopedics;  Laterality: Right;   VAGINAL HYSTERECTOMY     VISCERAL ARTERY INTERVENTION N/A 06/20/2017   Procedure: Visceral Artery Intervention, possible aortic thrombectomy;  Surgeon: Algernon Huxley, MD;  Location: [facility];  Service: Cardiovascular;  Laterality: N/A;   VISCERAL ARTERY INTERVENTION N/A 01/28/2018   Procedure: VISCERAL ARTERY INTERVENTION;  Surgeon: Algernon Huxley, MD;  Location: [place];  Service: Cardiovascular;  Laterality: N/A;    Prior to Admission medications   Medication Sig Start Date End Date Taking? Authorizing Provider  amLODipine (NORVASC) 5 MG tablet Take 1 tablet (5 mg total) by mouth daily. 04/10/18   Gladstone Lighter, MD  apixaban (ELIQUIS) 5 MG TABS tablet Take 5 mg by mouth 2 (two) times daily.    [provider]  atorvastatin (LIPITOR) 80 MG tablet Take 80 mg by mouth at bedtime.     [provider]  dicyclomine (BENTYL) 20 MG tablet Take 1 tablet (20 mg total) by mouth 3 (three) times daily as needed (abdominal pain). 11/10/18   Nance Pear, MD  DULoxetine (CYMBALTA) 30 MG capsule Take 30 mg by mouth daily.   08/24/17   [provider]  ferrous sulfate 325 (65 FE) MG tablet Take 325 mg by mouth daily.    [provider]  metoprolol tartrate (LOPRESSOR) 50 MG tablet Take 1 tablet (50 mg total) by mouth 2 (two) times daily. 06/15/18   Bettey Costa, MD  omeprazole (PRILOSEC) 20 MG capsule Take 20 mg by mouth 2 (two) times daily before a meal.    [provider]  ondansetron (ZOFRAN) 4 MG tablet Take 1 tablet (4 mg total) by mouth every 8 (eight) hours as needed. 11/10/18   Nance Pear, MD  polyethylene glycol [facility] / Floria Raveling) packet Take 17 g by mouth 2 (two) times daily as needed.     [provider]  traMADol (ULTRAM) 50 MG tablet Take 50 mg by mouth 2 (two) times daily as needed. 10/11/18   [provider]    Allergies Patient has no known allergies.  Family History  Problem Relation Age of Onset   Hypertension Mother    Heart disease Mother    Heart attack Mother  Breast cancer Mother    Hypertension Father    Breast cancer Paternal Aunt     Social History Social History   Tobacco Use   Smoking status: Current Every Day Smoker    Packs/day: 0.50    Types: Cigarettes   Smokeless tobacco: Never Used  Substance Use Topics   Alcohol use: No   Drug use: No    Review of Systems  Constitutional: No fever/chills Eyes: No visual changes. ENT: No sore throat. Cardiovascular:chest pain. Respiratory:shortness of breath. Gastrointestinal: No abdominal pain.   nausea,  vomiting.  No diarrhea.  No constipation. Genitourinary: Negative for dysuria. Musculoskeletal: Negative for back pain. Skin: Negative for rash. Neurological: Negative for headaches, focal weakness   ____________________________________________   PHYSICAL EXAM:  VITAL SIGNS: ED Triage Vitals  Enc Vitals Group     BP 02/03/19 0447 (!) 199/118     Pulse Rate 02/03/19 0447 (!) 119     Resp 02/03/19 0447 (!) 22     Temp --      Temp src --      SpO2  02/03/19 0447 97 %     Weight 02/03/19 0448 200 lb (90.7 kg)     Height 02/03/19 0448 5\' 2"  (1.575 m)     Head Circumference --      Peak Flow --      Pain Score 02/03/19 0448 10     Pain Loc --      Pain Edu? --      Excl. in [place]? --     Constitutional: Alert and oriented.  Nauseated and vomits Eyes: Conjunctivae are normal.  Head: Atraumatic. Nose: No congestion/rhinnorhea. Mouth/Throat: Mucous membranes are moist.  Oropharynx non-erythematous. Neck: No stridor.   Cardiovascular: Normal rate, regular rhythm. Grossly normal heart sounds.  Good peripheral circulation. Respiratory: Normal respiratory effort.  No retractions. Lungs CTAB. Gastrointestinal: Soft and nontender. No distention. No abdominal bruits. No CVA tenderness. Musculoskeletal: No lower extremity tenderness nor edema.  No joint effusions. Neurologic:  Normal speech and language. No gross focal neurologic deficits are appreciated.  Skin:  Skin is warm, dry and intact.  Hives on legs   ____________________________________________   LABS (all labs ordered are listed, but only abnormal results are displayed)  Labs Reviewed  COMPREHENSIVE METABOLIC PANEL - Abnormal; Notable for the following components:      Result Value   Sodium 134 (*)    CO2 16 (*)    Glucose, Bld 224 (*)    BUN 27 (*)    Creatinine, Ser 1.09 (*)    AST 14 (*)    GFR calc non Af Amer 56 (*)    All other components within normal limits  CBC WITH DIFFERENTIAL/PLATELET - Abnormal; Notable for the following components:   WBC 14.9 (*)    RBC 5.60 (*)    Hemoglobin 16.5 (*)    HCT 49.6 (*)    Neutro Abs 13.3 (*)    All other components within normal limits  TROPONIN I  TROPONIN I   ____________________________________________  EKG  EKG read interpreted by me shows normal sinus rhythm rate of 97 normal axis no acute ST-T wave changes _EKG #2 read and interpreted by me shows sinus tachycardia rate of 103 normal axis no acute ST-T wave  changes essentially the same as #1 ___________________________________________  RADIOLOGY  ED MD interpretation:    Official radiology report(s): Dg Chest Portable 1 View  Result Date: 02/03/2019 CLINICAL DATA:  Chest pain.  Shortness of  breath. EXAM: PORTABLE CHEST 1 VIEW COMPARISON:  Two-view chest x-ray 09/19/2018 FINDINGS: The heart size is normal. There is no edema or effusion. No focal airspace disease is present. The visualized soft tissues and bony thorax are unremarkable. IMPRESSION: No acute cardiopulmonary disease. Electronically Signed   By: San Morelle M.D.   On: 02/03/2019 05:38   Ct Angio Chest Aorta W And/or Wo Contrast  Result Date: 02/03/2019 CLINICAL DATA:  Hives. Severe hypertension. Epigastric and chest pain extending to the back. EXAM: CT ANGIOGRAPHY CHEST WITH CONTRAST TECHNIQUE: Multidetector CT imaging of the chest was performed using the standard protocol during bolus administration of intravenous contrast. Multiplanar CT image reconstructions and MIPs were obtained to evaluate the vascular anatomy. CONTRAST:  23mL OMNIPAQUE IOHEXOL 350 MG/ML SOLN COMPARISON:  CTA chest 04/07/2018 FINDINGS: Cardiovascular: Heart size is normal. Atherosclerotic calcifications are present within the coronary arteries. No displaced calcifications are present at the aortic arch. CTA demonstrates normal caliber of the aortic arch and descending thoracic aorta with some atherosclerotic change. There is no dissection. A 3 vessel arch configuration is present. There is some calcification at the origins of the great vessels without stenosis or vascular injury. Mediastinum/Nodes: No significant mediastinal, hilar, or axillary adenopathy is present. Thoracic inlet is within normal limits. Lungs/Pleura: Previously noted posterior right pulmonary nodule has increased in size, now measuring 8 mm. A 3 mm right upper lobe nodule is present. Mild dependent atelectasis is present. The lungs are  otherwise clear. No significant pleural effusions are present. Upper Abdomen: The patient is status post cholecystectomy. Left adrenal adenoma is stable. No other solid organ lesions are present. There is no significant adenopathy. Musculoskeletal: Vertebral body heights and alignment are maintained. Mild endplate changes are present. No focal lytic or blastic lesions are present. The ribs are unremarkable. Review of the MIP images confirms the above findings. IMPRESSION: 1. No acute abnormality of the aorta or pulmonary arteries. 2. Atherosclerosis without aneurysm. 3. Increasing size of posterior right pulmonary nodule best seen on image 70 of series 7. Non-contrast chest CT at 6-12 months is recommended. If the nodule is stable at time of repeat CT, then future CT at 18-24 months (from today's scan) is considered optional for low-risk patients, but is recommended for high-risk patients. This recommendation follows the consensus statement: Guidelines for Management of Incidental Pulmonary Nodules Detected on CT Images: From the Fleischner Society 2017; Radiology 2017; 284:228-243. 4. No other acute or focal abnormality to explain the patient's chest pain. Electronically Signed   By: San Morelle M.D.   On: 02/03/2019 06:36    ____________________________________________   PROCEDURES  Procedure(s) performed (including Critical Care):  Procedures   ____________________________________________   INITIAL IMPRESSION / ASSESSMENT AND PLAN / ED COURSE ----------------------------------------- 6:56 AM on 02/03/2019 -----------------------------------------  Patient's rash is now gone she is no longer itching.  Back pain is still present her chest is feeling much better.  Pressure went away with the Nitropaste.  Blood pressure is down slightly to 027 systolic.  I will give her her metoprolol and her Norvasc and will watch her for a little bit.   ----------------------------------------- 7:26  AM on 02/03/2019 -----------------------------------------  Patient somewhat better still having a little bit of achy chest pain.  However she is a very high risk patient with her history of chronic mesenteric ischemia and superior mesenteric artery embolus morbid obesity hypertension diabetes and hyperlipidemia.  This chest pressure may have been an anginal pain due to the stress from her allergic reaction.  I will asked the hospitalist to watch her for a little bit and rule her out completely.        ____________________________________________   FINAL CLINICAL IMPRESSION(S) / ED DIAGNOSES  Final diagnoses:  Allergic reaction, initial encounter  Chest pain, unspecified type     ED Discharge Orders    None       Note:  This document was prepared using Dragon voice recognition software and may include unintentional dictation errors.    Nena Polio, MD 02/03/19 0720    Nena Polio, MD 02/03/19 [phone]

## 2019-02-03 NOTE — Consult Note (Signed)
[facility]  CARDIOLOGY CONSULT NOTE  Patient ID: Carla [name] MRN: [id number] DOB/AGE: 59-Apr-1962 59 y.o.  Admit date: 02/03/2019 Referring Physician Dr. Nicholes Mango  Primary Physician Dr. Frazier Richards Primary Cardiologist Dr. Clayborn Bigness Reason for Consultation Atypical chest pain   HPI: Carla [name] is a 58 year old female with a past medical history significant for atrial fibrillation, on Eliquis, chronic diastolic congestive heart failure, hypertension, and hyperlipidemia who presented to the ED on 02/03/2019 for an acute onset of hives, shortness of breath, and chest pressure, described as "someone sitting on my chest."  Workup in the ED included ECG which was negative for ischemic changes, troponin negative x1, chest xray negative for acute cardiopulmonary disease, and chest CT negative for acute abnormalities. Of note, had recently been on Bactrim for a UTI, and last dose was taken on 02/02/19.  Symptoms have significantly improved, and she currently only admits to mild chest pressure.  Denies heart racing or palpitations.  Shortness of breath has resolved. Denies lower extremity swelling.  Denies abdominal pain.   She is followed in outpatient cardiology by Dr. Clayborn Bigness. Stress test on 06/28/18 revealed no evidence of ischemia.  Echocardiogram on 06/24/18 revealed normal LV function with an estimated EF of 55-60% with mild LVH, trivial MR, TR, and PR.   Review of systems complete and found to be negative unless listed above     Past Medical History:  Diagnosis Date  . Abdominal aortic atherosclerosis ([place])    a. 05/2017 CTA abd/pelvis: significant atherosclerotic dzs of the infrarenal abd Ao w/ some mural thrombus. No aneurysm or dissection.  . Abdominal pain   . Acute focal ischemia of small intestine (HCC)   . Acute right-sided low back pain with right-sided sciatica 06/13/2017  . AKI (acute kidney injury) ([place]) 07/29/2017  . Baker's cyst of knee, right    a. 07/2016 U/S: 4.1 x 1.4 x  2.9 cystic structure in R poplitetal fossa.  . Bell's palsy   . Borderline diabetes   . Cancer ([place])    skin cancer on nose  . Chest pain    a. 08/2012 Lexiscan MV: EF 54%, non ischemia/infarct.  . CHF (congestive heart failure) ([place])   . Chronic idiopathic constipation   . Chronic mesenteric ischemia ([place])   . Diastolic dysfunction    a. 05/2017 Echo: Ef 60-65%, no rwma, Gr1 DD, no source of cardiac emboli.  . 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 05/2017 CTA Abd/pelvis: apparent thrombus or embolus in prox SMA (70-90%); b. 05/2017 catheter directed tPA, mechanical thrombectomy, and stenting of the SMA.  . Essential hypertension with goal blood pressure less than 140/90 01/19/2016  . Gastroesophageal reflux disease 01/19/2016  . GERD (gastroesophageal reflux disease)   . History of kidney stones   . Hyperlipidemia   . Hypertension   . Hypotension 07/29/2017  . Morbid obesity with BMI of 40.0-44.9, adult ([place])   . Occlusive mesenteric ischemia ([place]) 06/19/2017  . Personal history of other malignant neoplasm of skin 06/01/2016  . Primary osteoarthritis of both knees 06/13/2017  . Recurrent major depressive disorder ([place]) 01/19/2016  . SBO (small bowel obstruction) ([place]) 08/06/2017  . 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 Urinary tract infection 01/09/2014    Past Surgical History:  Procedure Laterality Date  . APPENDECTOMY    . CHOLECYSTECTOMY    . LAPAROTOMY N/A 08/08/2017   Procedure: EXPLORATORY LAPAROTOMY POSSIBLE BOWEL RESECTION;  Surgeon: Jules Husbands, MD;  Location: [facility]  ORS;  Service: General;  Laterality: N/A;  . TEE WITHOUT CARDIOVERSION N/A 06/22/2017   Procedure: TRANSESOPHAGEAL ECHOCARDIOGRAM (TEE);  Surgeon: Wellington Hampshire, MD;  Location: [facility] ORS;  Service: Cardiovascular;  Laterality: N/A;  . TOTAL KNEE ARTHROPLASTY Right 07/11/2018   Procedure: TOTAL KNEE ARTHROPLASTY;  Surgeon: Corky Mull, MD;  Location: [facility] ORS;  Service: Orthopedics;   Laterality: Right;  [name] VAGINAL HYSTERECTOMY    . VISCERAL ARTERY INTERVENTION N/A 06/20/2017   Procedure: Visceral Artery Intervention, possible aortic thrombectomy;  Surgeon: Algernon Huxley, MD;  Location: [place] CV LAB;  Service: Cardiovascular;  Laterality: N/A;  . VISCERAL ARTERY INTERVENTION N/A 01/28/2018   Procedure: VISCERAL ARTERY INTERVENTION;  Surgeon: Algernon Huxley, MD;  Location: [place] CV LAB;  Service: Cardiovascular;  Laterality: N/A;    Medications Prior to Admission  Medication Sig Dispense Refill Last Dose  . amLODipine (NORVASC) 5 MG tablet Take 1 tablet (5 mg total) by mouth daily. 30 tablet 2 02/01/2019 at am  . apixaban (ELIQUIS) 5 MG TABS tablet Take 5 mg by mouth 2 (two) times daily.   02/01/2019 at 1930  . atorvastatin (LIPITOR) 80 MG tablet Take 80 mg by mouth at bedtime.    02/01/2019 at pm  . clotrimazole-betamethasone (LOTRISONE) cream Apply 1 application topically 2 (two) times daily as needed (for dry skin).    Past Month at Unknown time  . DULoxetine (CYMBALTA) 60 MG capsule Take 60 mg by mouth daily.   02/01/2019 at am  . ferrous sulfate 325 (65 FE) MG tablet Take 325 mg by mouth daily.   02/01/2019 at am  . metoprolol tartrate (LOPRESSOR) 50 MG tablet Take 1 tablet (50 mg total) by mouth 2 (two) times daily. 30 tablet 0 02/01/2019 at 1930  . omeprazole (PRILOSEC) 20 MG capsule Take 20 mg by mouth 2 (two) times daily before a meal.   02/01/2019 at pm  . ondansetron (ZOFRAN) 4 MG tablet Take 1 tablet (4 mg total) by mouth every 8 (eight) hours as needed. (Patient taking differently: Take 4 mg by mouth every 8 (eight) hours as needed for nausea or vomiting. ) 20 tablet 0 02/02/2019 at Unknown time  . polyethylene glycol (MIRALAX / GLYCOLAX) packet Take 17 g by mouth 2 (two) times daily as needed for mild constipation or moderate constipation.    Past Month at Unknown time  . sulfamethoxazole-trimethoprim (BACTRIM DS,SEPTRA DS) 800-160 MG tablet Take 1 tablet by mouth  2 (two) times daily.   02/01/2019 at pm  . dicyclomine (BENTYL) 20 MG tablet Take 1 tablet (20 mg total) by mouth 3 (three) times daily as needed (abdominal pain). (Patient not taking: Reported on 02/03/2019) 30 tablet 0 Not Taking at Unknown time   Social History   Socioeconomic History  . Marital status: Single    Spouse name: Not on file  . Number of children: Not on file  . Years of education: Not on file  . Highest education level: Not on file  Occupational History  . Occupation: Unemployed  Social Needs  . Financial resource strain: Not on file  . Food insecurity:    Worry: Not on file    Inability: Not on file  . Transportation needs:    Medical: Not on file    Non-medical: Not on file  Tobacco Use  . Smoking status: Current Every Day Smoker    Packs/day: 0.50    Types: Cigarettes  . Smokeless tobacco: Never Used  Substance and Sexual  Activity  . Alcohol use: No  . Drug use: No  . Sexual activity: Never  Lifestyle  . Physical activity:    Days per week: Not on file    Minutes per session: Not on file  . Stress: Not on file  Relationships  . Social connections:    Talks on phone: Not on file    Gets together: Not on file    Attends religious service: Not on file    Active member of club or organization: Not on file    Attends meetings of clubs or organizations: Not on file    Relationship status: Not on file  . Intimate partner violence:    Fear of current or ex partner: Not on file    Emotionally abused: Not on file    Physically abused: Not on file    Forced sexual activity: Not on file  Other Topics Concern  . Not on file  Social History Narrative   Lives in [place] with boyfriend.  Does not routinely exercise.    Family History  Problem Relation Age of Onset  . Hypertension Mother   . Heart disease Mother   . Heart attack Mother   . Breast cancer Mother   . Hypertension Father   . Breast cancer Paternal Aunt       Review of systems complete and  found to be negative unless listed above      PHYSICAL EXAM  General: Well developed, well nourished, sitting up in bed in no acute distress HEENT:  Normocephalic and atramatic Neck:  No JVD.  Lungs: Clear bilaterally to auscultation and percussion. Heart: HRRR . Normal S1 and S2 without gallops or murmurs.  Abdomen: Bowel sounds are positive, abdomen soft and non-tender  Msk:  Back normal.  Normal strength and tone for age. Extremities: No clubbing, cyanosis or edema.   Neuro: Alert and oriented X 3. Psych:  Good affect, responds appropriately Skin: Two raised, circular welts noted on left great toe.  No evidence of wide spread urticaria   Labs:   Lab Results  Component Value Date   WBC 14.9 (H) 02/03/2019   HGB 16.5 (H) 02/03/2019   HCT 49.6 (H) 02/03/2019   MCV 88.6 02/03/2019   PLT 366 02/03/2019    Recent Labs  Lab 02/03/19 0507  NA 134*  K 4.2  CL 103  CO2 16*  BUN 27*  CREATININE 1.09*  CALCIUM 9.0  PROT 7.9  BILITOT 0.7  ALKPHOS 113  ALT 15  AST 14*  GLUCOSE 224*   Lab Results  Component Value Date   CKTOTAL 37 12/22/2012   CKMB < 0.5 (L) 12/22/2012   TROPONINI <0.03 02/03/2019    Lab Results  Component Value Date   CHOL 304 (H) 07/22/2012   Lab Results  Component Value Date   HDL 51 07/22/2012   Lab Results  Component Value Date   LDLCALC 191 (H) 07/22/2012   Lab Results  Component Value Date   TRIG 167 (H) 08/13/2017   TRIG 119 08/09/2017   TRIG 159 (H) 08/08/2017   No results found for: CHOLHDL No results found for: LDLDIRECT    Radiology: Dg Chest Portable 1 View  Result Date: 02/03/2019 CLINICAL DATA:  Chest pain.  Shortness of breath. EXAM: PORTABLE CHEST 1 VIEW COMPARISON:  Two-view chest x-ray 09/19/2018 FINDINGS: The heart size is normal. There is no edema or effusion. No focal airspace disease is present. The visualized soft tissues and bony thorax are unremarkable.  IMPRESSION: No acute cardiopulmonary disease.  Electronically Signed   By: San Morelle M.D.   On: 02/03/2019 05:38   Ct Angio Chest Aorta W And/or Wo Contrast  Result Date: 02/03/2019 CLINICAL DATA:  Hives. Severe hypertension. Epigastric and chest pain extending to the back. EXAM: CT ANGIOGRAPHY CHEST WITH CONTRAST TECHNIQUE: Multidetector CT imaging of the chest was performed using the standard protocol during bolus administration of intravenous contrast. Multiplanar CT image reconstructions and MIPs were obtained to evaluate the vascular anatomy. CONTRAST:  4mL OMNIPAQUE IOHEXOL 350 MG/ML SOLN COMPARISON:  CTA chest 04/07/2018 FINDINGS: Cardiovascular: Heart size is normal. Atherosclerotic calcifications are present within the coronary arteries. No displaced calcifications are present at the aortic arch. CTA demonstrates normal caliber of the aortic arch and descending thoracic aorta with some atherosclerotic change. There is no dissection. A 3 vessel arch configuration is present. There is some calcification at the origins of the great vessels without stenosis or vascular injury. Mediastinum/Nodes: No significant mediastinal, hilar, or axillary adenopathy is present. Thoracic inlet is within normal limits. Lungs/Pleura: Previously noted posterior right pulmonary nodule has increased in size, now measuring 8 mm. A 3 mm right upper lobe nodule is present. Mild dependent atelectasis is present. The lungs are otherwise clear. No significant pleural effusions are present. Upper Abdomen: The patient is status post cholecystectomy. Left adrenal adenoma is stable. No other solid organ lesions are present. There is no significant adenopathy. Musculoskeletal: Vertebral body heights and alignment are maintained. Mild endplate changes are present. No focal lytic or blastic lesions are present. The ribs are unremarkable. Review of the MIP images confirms the above findings. IMPRESSION: 1. No acute abnormality of the aorta or pulmonary arteries. 2.  Atherosclerosis without aneurysm. 3. Increasing size of posterior right pulmonary nodule best seen on image 70 of series 7. Non-contrast chest CT at 6-12 months is recommended. If the nodule is stable at time of repeat CT, then future CT at 18-24 months (from today's scan) is considered optional for low-risk patients, but is recommended for high-risk patients. This recommendation follows the consensus statement: Guidelines for Management of Incidental Pulmonary Nodules Detected on CT Images: From the Fleischner Society 2017; Radiology 2017; 284:228-243. 4. No other acute or focal abnormality to explain the patient's chest pain. Electronically Signed   By: San Morelle M.D.   On: 02/03/2019 06:36    EKG: Sinus rhythm, no evidence of acute ischemic changes  ASSESSMENT AND PLAN:  1.  Chest pain   -ECG negative for acute ischemic changes, first troponin negative; stress test in September 2019 was negative for ischemic changes; with resolution of symptoms, will defer further cardiac evaluation at this time - can be considered for discharge later today if symptoms remain stable  -Possible reaction to Bactrim with concurrent hives; would recommend caution with use in the future 2.  History of atrial fibrillation   -Currently in sinus rhythm with controlled rate; continue home dose of metoprolol   -Continue Eliquis 5mg  twice daily  3.  History of diastolic dysfunction   -Euvolemic on exam; no further testing indicated at this time  4.  Hypertension   -Was hypertensive on presentation; BP has since stabilized   -Continue current home medications   The history, physical exam findings, and plan of care were all discussed with Dr. Bartholome Bill, and all decision making was made in collaboration.   Signed: Avie Arenas PA-C 02/03/2019, 10:18 AM

## 2019-02-03 NOTE — ED Notes (Signed)
DG at bedside;   Pt requesting "Vicodin or percocet - which ever"  for chronic back pain   Dr Cinda Quest notified

## 2019-02-04 DIAGNOSIS — R0789 Other chest pain: Secondary | ICD-10-CM | POA: Diagnosis not present

## 2019-02-04 MED ORDER — PREDNISONE 50 MG PO TABS: 50.0000 mg | ORAL_TABLET | Freq: Every day | ORAL | 0 refills | Status: DC

## 2019-02-04 MED ORDER — DIPHENHYDRAMINE HCL 25 MG PO TABS: 25.0000 mg | ORAL_TABLET | Freq: Three times a day (TID) | ORAL | 0 refills | Status: DC | PRN

## 2019-02-04 NOTE — Progress Notes (Signed)
IV and tele removed from patient. Discharge instructions given to patient. Verbalized understanding. No acute distress at this time. Awaiting ride.

## 2019-02-04 NOTE — Progress Notes (Signed)
[facility]  SUBJECTIVE: Carla [name] is a 58 year old female who presented to the ED on 02/03/2019 for an acute onset of hives, shortness of breath, and chest pressure, described as "someone sitting on my chest."  Workup in the ED included ECG which was negative for ischemic changes, troponin negative x1, chest xray negative for acute cardiopulmonary disease, and chest CT negative for acute abnormalities. Of note, had recently been on Bactrim for a UTI.   Today, Carla [name] reports that chest pain and shortness of breath has resolved.  Continues to admit to generalized pruritis.  Denies abdominal pain, N/V/D.  Denies orthopnea, PND, or lower extremity swelling.   She is followed in outpatient cardiology by Dr. Clayborn Bigness. Stress test on 06/28/18 revealed no evidence of ischemia.  Echocardiogram on 06/24/18 revealed normal LV function with an estimated EF of 55-60% with mild LVH, trivial MR, TR, and PR.  Vitals:   02/03/19 1649 02/03/19 1931 02/04/19 0521 02/04/19 0722  BP: 115/81 (!) 152/91 130/78 110/61  Pulse: 80 78 (!) 57 (!) 58  Resp:  19 18 20   Temp: 97.7 F (36.5 C) 98.3 F (36.8 C) 97.9 F (36.6 C) 98 F (36.7 C)  TempSrc: Oral Oral Oral Oral  SpO2: 96% 95% 97% 97%  Weight:      Height:         Intake/Output Summary (Last 24 hours) at 02/04/2019 0754 Last data filed at 02/03/2019 2227 Gross per 24 hour  Intake -  Output 200 ml  Net -200 ml      PHYSICAL EXAM  General: Well developed, well nourished, in no acute distress HEENT:  Normocephalic and atramatic Neck:  No JVD.  Lungs: Clear bilaterally to auscultation and percussion. Heart: HRRR . Normal S1 and S2 without gallops or murmurs.  Abdomen: Bowel sounds are positive, abdomen soft and non-tender  Msk:  Back normal. Normal strength and tone for age. Extremities: No clubbing, cyanosis or edema.   Neuro: Alert and oriented X 3. Psych:  Good affect, responds appropriately   LABS: Basic Metabolic Panel: Recent Labs   02/03/19 0507  NA 134*  K 4.2  CL 103  CO2 16*  GLUCOSE 224*  BUN 27*  CREATININE 1.09*  CALCIUM 9.0   Liver Function Tests: Recent Labs    02/03/19 0507  AST 14*  ALT 15  ALKPHOS 113  BILITOT 0.7  PROT 7.9  ALBUMIN 4.0   No results for input(s): LIPASE, AMYLASE in the last 72 hours. CBC: Recent Labs    02/03/19 0507  WBC 14.9*  NEUTROABS 13.3*  HGB 16.5*  HCT 49.6*  MCV 88.6  PLT 366   Cardiac Enzymes: Recent Labs    02/03/19 0507  TROPONINI <0.03   BNP: Invalid input(s): POCBNP D-Dimer: No results for input(s): DDIMER in the last 72 hours. Hemoglobin A1C: No results for input(s): HGBA1C in the last 72 hours. Fasting Lipid Panel: No results for input(s): CHOL, HDL, LDLCALC, TRIG, CHOLHDL, LDLDIRECT in the last 72 hours. Thyroid Function Tests: No results for input(s): TSH, T4TOTAL, T3FREE, THYROIDAB in the last 72 hours.  Invalid input(s): FREET3 Anemia Panel: No results for input(s): VITAMINB12, FOLATE, FERRITIN, TIBC, IRON, RETICCTPCT in the last 72 hours.  Dg Chest Portable 1 View  Result Date: 02/03/2019 CLINICAL DATA:  Chest pain.  Shortness of breath. EXAM: PORTABLE CHEST 1 VIEW COMPARISON:  Two-view chest x-ray 09/19/2018 FINDINGS: The heart size is normal. There is no edema or effusion. No focal airspace disease is present.  The visualized soft tissues and bony thorax are unremarkable. IMPRESSION: No acute cardiopulmonary disease. Electronically Signed   By: San Morelle M.D.   On: 02/03/2019 05:38   Ct Angio Chest Aorta W And/or Wo Contrast  Result Date: 02/03/2019 CLINICAL DATA:  Hives. Severe hypertension. Epigastric and chest pain extending to the back. EXAM: CT ANGIOGRAPHY CHEST WITH CONTRAST TECHNIQUE: Multidetector CT imaging of the chest was performed using the standard protocol during bolus administration of intravenous contrast. Multiplanar CT image reconstructions and MIPs were obtained to evaluate the vascular anatomy. CONTRAST:   62mL OMNIPAQUE IOHEXOL 350 MG/ML SOLN COMPARISON:  CTA chest 04/07/2018 FINDINGS: Cardiovascular: Heart size is normal. Atherosclerotic calcifications are present within the coronary arteries. No displaced calcifications are present at the aortic arch. CTA demonstrates normal caliber of the aortic arch and descending thoracic aorta with some atherosclerotic change. There is no dissection. A 3 vessel arch configuration is present. There is some calcification at the origins of the great vessels without stenosis or vascular injury. Mediastinum/Nodes: No significant mediastinal, hilar, or axillary adenopathy is present. Thoracic inlet is within normal limits. Lungs/Pleura: Previously noted posterior right pulmonary nodule has increased in size, now measuring 8 mm. A 3 mm right upper lobe nodule is present. Mild dependent atelectasis is present. The lungs are otherwise clear. No significant pleural effusions are present. Upper Abdomen: The patient is status post cholecystectomy. Left adrenal adenoma is stable. No other solid organ lesions are present. There is no significant adenopathy. Musculoskeletal: Vertebral body heights and alignment are maintained. Mild endplate changes are present. No focal lytic or blastic lesions are present. The ribs are unremarkable. Review of the MIP images confirms the above findings. IMPRESSION: 1. No acute abnormality of the aorta or pulmonary arteries. 2. Atherosclerosis without aneurysm. 3. Increasing size of posterior right pulmonary nodule best seen on image 70 of series 7. Non-contrast chest CT at 6-12 months is recommended. If the nodule is stable at time of repeat CT, then future CT at 18-24 months (from today's scan) is considered optional for low-risk patients, but is recommended for high-risk patients. This recommendation follows the consensus statement: Guidelines for Management of Incidental Pulmonary Nodules Detected on CT Images: From the Fleischner Society 2017; Radiology  2017; 284:228-243. 4. No other acute or focal abnormality to explain the patient's chest pain. Electronically Signed   By: San Morelle M.D.   On: 02/03/2019 06:36     TELEMETRY: Sinus rhythm, no evidence of ischemic changes  ASSESSMENT AND PLAN:  Active Problems:   Chest pain    1.  Chest pain   -Has resolved, troponin negative, no ischemic changes on EKG; likely in the setting of allergic reaction to Bactrim   -No further workup indicated from a cardiovascular perspective  2.  History of atrial fibrillation   -Currently in sinus rhythm, rate controlled   -Continue home dose of metoprolol and Eliquis  3.  History of diastolic dysfunction   -Euvolemic on exam; no further workup indicated at this time  4.  Hypertension   -Continue home antihypertensive medication regimen   The history, physical exam findings, and plan of care were all discussed with Dr. Bartholome Bill, and all decision making was made in collaboration.   Avie Arenas PA-C 02/04/2019 7:54 AM

## 2019-02-04 NOTE — Plan of Care (Signed)
  Problem: Activity: Goal: Risk for activity intolerance will decrease Outcome: Progressing Note:  Independent in room   Problem: Nutrition: Goal: Adequate nutrition will be maintained Outcome: Progressing   Problem: Coping: Goal: Level of anxiety will decrease Outcome: Progressing   Problem: Pain Managment: Goal: General experience of comfort will improve Outcome: Progressing Note:  No complaints of pain this shift   Problem: Safety: Goal: Ability to remain free from injury will improve Outcome: Progressing   Problem: Skin Integrity: Goal: Risk for impaired skin integrity will decrease Outcome: Progressing   Problem: Education: Goal: Knowledge of General Education information will improve Description Including pain rating scale, medication(s)/side effects and non-pharmacologic comfort measures Outcome: Completed/Met

## 2019-02-04 NOTE — Discharge Instructions (Signed)
Resume diet and activity as before ° ° °

## 2019-02-05 LAB — HIV ANTIBODY (ROUTINE TESTING W REFLEX): HIV Screen 4th Generation wRfx: NONREACTIVE

## 2019-02-10 NOTE — Discharge Summary (Signed)
[facility] NAME: Carla [name]    MR#:  [id number]  DATE OF BIRTH:  07-21-1961  DATE OF ADMISSION:  02/03/2019 ADMITTING PHYSICIAN: Nicholes Mango, MD  DATE OF DISCHARGE: 02/04/2019 11:40 AM  PRIMARY CARE PHYSICIAN: Kirk Ruths, MD   ADMISSION DIAGNOSIS:  Allergic reaction, initial encounter [T78.40XA] Chest pain, unspecified type [R07.9]  DISCHARGE DIAGNOSIS:  Active Problems:   Chest pain   SECONDARY DIAGNOSIS:   Past Medical History:  Diagnosis Date  . Abdominal aortic atherosclerosis ([place])    a. 05/2017 CTA abd/pelvis: significant atherosclerotic dzs of the infrarenal abd Ao w/ some mural thrombus. No aneurysm or dissection.  . Abdominal pain   . Acute focal ischemia of small intestine (H[facility])   . Acute right-sided low back pain with right-sided sciatica 06/13/2017  . AKI (acute kidney injury) ([place]) 07/29/2017  . Baker's cyst of knee, right    a. 07/2016 U/S: 4.1 x 1.4 x 2.9 cystic structure in R poplitetal fossa.  . Bell's palsy   . Borderline diabetes   . Cancer ([place])    skin cancer on nose  . Chest pain    a. 08/2012 Lexiscan MV: EF 54%, non ischemia/infarct.  . CHF (congestive heart failure) ([place])   . Chronic idiopathic constipation   . Chronic mesenteric ischemia ([place])   . Diastolic dysfunction    a. 05/2017 Echo: Ef 60-65%, no rwma, Gr1 DD, no source of cardiac emboli.  . 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 05/2017 CTA Abd/pelvis: apparent thrombus or embolus in prox SMA (70-90%); b. 05/2017 catheter directed tPA, mechanical thrombectomy, and stenting of the SMA.  . Essential hypertension with goal blood pressure less than 140/90 01/19/2016  . Gastroesophageal reflux disease 01/19/2016  . GERD (gastroesophageal reflux disease)   . History of kidney stones   . Hyperlipidemia   . Hypertension   . Hypotension 07/29/2017  . Morbid obesity with BMI of 40.0-44.9, adult ([place])   . Occlusive mesenteric  ischemia ([place]) 06/19/2017  . Personal history of other malignant neoplasm of skin 06/01/2016  . Primary osteoarthritis of both knees 06/13/2017  . Recurrent major depressive disorder ([place]) 01/19/2016  . SBO (small bowel obstruction) ([place]) 08/06/2017  . 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 Urinary tract infection 01/09/2014     ADMITTING HISTORY  HISTORY OF PRESENT ILLNESS:  Carla [name]  is a 58 y.o. female with a known history of hypertension, hyperlipidemia, chronic congestive heart failure, chronic mesenteric ischemia, ischemic colitis status post colectomy and multiple other medical problems is presenting to the ED with a chief complaint of rash, shortness of breath with chest discomfort.  Patient has received IV Solu-Medrol, Benadryl and Pepcid in the ED and the rash resolved but patient was still having chest discomfort associated with exertional dyspnea.  Initial troponin is negative and hospitalist's team is called admit the patient to rule out chest pain.  Patient sees Dr. Vida Roller as an outpatient  HOSPITAL COURSE:  * Atypical chest pain.  Patient admitted to telemetry floor.  Troponin repeated and stable.  EKG showed no acute changes.  Chest pain resolved in the hospital.  Patient was seen by cardiology and did not need any further work-up.  Telemetry showed no arrhythmias.  Outpatient cardiology follow-up  *Drug rash..  This is improving.  Patient given IV Solu-Medrol and later started on prednisone along with as needed Benadryl.  Likely due to Bactrim.  Added to allergy  list.  Patient discharged home with prednisone and Benadryl.  Follow-up with PCP.  *Recent UTI on Bactrim.  Finished course.  Did not need any further antibiotics.  Afebrile.  Normal WBC.  Discharged home in stable condition  CONSULTS OBTAINED:  Treatment Team:  Teodoro Spray, MD  DRUG ALLERGIES:   Allergies  Allergen Reactions  . Bactrim [Sulfamethoxazole-Trimethoprim] Itching    DISCHARGE  MEDICATIONS:   Allergies as of 02/04/2019      Reactions   Bactrim [sulfamethoxazole-trimethoprim] Itching      Medication List    STOP taking these medications   dicyclomine 20 MG tablet Commonly known as:  Bentyl   sulfamethoxazole-trimethoprim 800-160 MG tablet Commonly known as:  BACTRIM DS     TAKE these medications   amLODipine 5 MG tablet Commonly known as:  NORVASC Take 1 tablet (5 mg total) by mouth daily.   apixaban 5 MG Tabs tablet Commonly known as:  ELIQUIS Take 5 mg by mouth 2 (two) times daily.   atorvastatin 80 MG tablet Commonly known as:  LIPITOR Take 80 mg by mouth at bedtime.   clotrimazole-betamethasone cream Commonly known as:  LOTRISONE Apply 1 application topically 2 (two) times daily as needed (for dry skin).   diphenhydrAMINE 25 MG tablet Commonly known as:  BENADRYL Take 1 tablet (25 mg total) by mouth every 8 (eight) hours as needed for itching or allergies.   DULoxetine 60 MG capsule Commonly known as:  CYMBALTA Take 60 mg by mouth daily.   ferrous sulfate 325 (65 FE) MG tablet Take 325 mg by mouth daily.   metoprolol tartrate 50 MG tablet Commonly known as:  LOPRESSOR Take 1 tablet (50 mg total) by mouth 2 (two) times daily.   omeprazole 20 MG capsule Commonly known as:  PRILOSEC Take 20 mg by mouth 2 (two) times daily before a meal.   ondansetron 4 MG tablet Commonly known as:  Zofran Take 1 tablet (4 mg total) by mouth every 8 (eight) hours as needed. What changed:  reasons to take this   polyethylene glycol 17 g packet Commonly known as:  MIRALAX / GLYCOLAX Take 17 g by mouth 2 (two) times daily as needed for mild constipation or moderate constipation.   predniSONE 50 MG tablet Commonly known as:  DELTASONE Take 1 tablet (50 mg total) by mouth daily with breakfast.       Today   VITAL SIGNS:  Blood pressure 110/61, pulse 82, temperature 98 F (36.7 C), temperature source Oral, resp. rate 20, height 5\' 2"  (1.575  m), weight 90.7 kg, SpO2 97 %.  I/O:  No intake or output data in the 24 hours ending 02/10/19 1300  PHYSICAL EXAMINATION:  Physical Exam  GENERAL:  58 y.o.-year-old patient lying in the bed with no acute distress.  LUNGS: Normal breath sounds bilaterally, no wheezing, rales,rhonchi or crepitation. No use of accessory muscles of respiration.  CARDIOVASCULAR: S1, S2 normal. No murmurs, rubs, or gallops.  ABDOMEN: Soft, non-tender, non-distended. Bowel sounds present. No organomegaly or mass.  NEUROLOGIC: Moves all 4 extremities. PSYCHIATRIC: The patient is alert and oriented x 3.  SKIN: No obvious rash, lesion, or ulcer.   DATA REVIEW:   CBC No results for input(s): WBC, HGB, HCT, PLT in the last 168 hours.  Chemistries  No results for input(s): NA, K, CL, CO2, GLUCOSE, BUN, CREATININE, CALCIUM, MG, AST, ALT, ALKPHOS, BILITOT in the last 168 hours.  Invalid input(s): GFRCGP  Cardiac Enzymes No results for input(s): TROPONINI  in the last 168 hours.  Microbiology Results  Results for orders placed or performed during the hospital encounter of 07/08/18  Surgical pcr screen     Status: None   Collection Time: 07/08/18  3:21 PM  Result Value Ref Range Status   MRSA, PCR NEGATIVE NEGATIVE Final   Staphylococcus aureus NEGATIVE NEGATIVE Final    Comment: (NOTE) The Xpert SA Assay (FDA approved for NASAL specimens in patients 66 years of age and older), is one component of a comprehensive surveillance program. It is not intended to diagnose infection nor to guide or monitor treatment. Performed at [facility], [address] [place], [place] [address]     RADIOLOGY:  No results found.  Follow up with PCP in 1 week.  Management plans discussed with the patient, family and they are in agreement.  CODE STATUS:  Code Status History    Date Active Date Inactive Code Status Order ID Comments User Context   02/03/2019 0839 02/04/2019 1445 Full Code [id number]  Nicholes Mango, MD ED   07/11/2018 1147 07/15/2018 1456 Full Code [id number]  Corky Mull, MD Inpatient   06/15/2018 0142 06/15/2018 1632 Full Code [id number]  Lance Coon, MD Inpatient   04/08/2018 0117 04/09/2018 1913 Full Code [id number]  Lance Coon, MD Inpatient   01/24/2018 2305 01/31/2018 1915 Full Code [id number]  Lance Coon, MD Inpatient   08/06/2017 2022 08/17/2017 2158 Full Code [id number]  Florene Glen, MD ED   07/29/2017 1600 08/02/2017 1814 Full Code [id number]  Gorden Harms, MD Inpatient   06/19/2017 2019 06/28/2017 1638 Full Code [id number]  Vickie Epley, MD ED      TOTAL TIME TAKING CARE OF THIS PATIENT ON DAY OF DISCHARGE: more than 30 minutes.   Leia Alf Nuh Lipton M.D on 02/10/2019 at 1:00 PM  Between 7am to 6pm - Pager - [phone]  After 6pm go to [email or web address] - password [email or web address] [facility]  Office  [phone]  CC: Primary care physician; Kirk Ruths, MD  Note: This dictation was prepared with Dragon dictation along with smaller phrase technology. Any transcriptional errors that result from this process are unintentional.

## 2019-03-25 ENCOUNTER — Encounter: Payer: Self-pay | Admitting: Pain Medicine

## 2019-03-25 DIAGNOSIS — R0781 Pleurodynia: Secondary | ICD-10-CM | POA: Insufficient documentation
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
## 2019-03-26 ENCOUNTER — Other Ambulatory Visit: Payer: Self-pay

## 2019-03-26 ENCOUNTER — Telehealth: Payer: Self-pay | Admitting: *Deleted

## 2019-03-26 ENCOUNTER — Ambulatory Visit: Payer: Medicare Other | Attending: Pain Medicine | Admitting: Pain Medicine

## 2019-03-26 DIAGNOSIS — G8929 Other chronic pain: Secondary | ICD-10-CM

## 2019-03-26 DIAGNOSIS — M545 Low back pain, unspecified: Secondary | ICD-10-CM | POA: Insufficient documentation

## 2019-03-26 DIAGNOSIS — M25561 Pain in right knee: Secondary | ICD-10-CM

## 2019-03-26 DIAGNOSIS — M170 Bilateral primary osteoarthritis of knee: Secondary | ICD-10-CM

## 2019-03-26 DIAGNOSIS — M25562 Pain in left knee: Secondary | ICD-10-CM

## 2019-03-26 DIAGNOSIS — Z0000 Encounter for general adult medical examination without abnormal findings: Secondary | ICD-10-CM | POA: Insufficient documentation

## 2019-03-26 DIAGNOSIS — Z789 Other specified health status: Secondary | ICD-10-CM

## 2019-03-26 DIAGNOSIS — G894 Chronic pain syndrome: Secondary | ICD-10-CM

## 2019-03-26 DIAGNOSIS — M47816 Spondylosis without myelopathy or radiculopathy, lumbar region: Secondary | ICD-10-CM

## 2019-03-26 DIAGNOSIS — M899 Disorder of bone, unspecified: Secondary | ICD-10-CM

## 2019-03-26 DIAGNOSIS — Z79899 Other long term (current) drug therapy: Secondary | ICD-10-CM

## 2019-03-26 DIAGNOSIS — M51379 Other intervertebral disc degeneration, lumbosacral region without mention of lumbar back pain or lower extremity pain: Secondary | ICD-10-CM

## 2019-03-26 DIAGNOSIS — M5137 Other intervertebral disc degeneration, lumbosacral region: Secondary | ICD-10-CM

## 2019-03-27 ENCOUNTER — Ambulatory Visit: Payer: Medicaid Other | Admitting: Student in an Organized Health Care Education/Training Program

## 2019-04-16 ENCOUNTER — Telehealth: Payer: Self-pay

## 2019-04-16 NOTE — Progress Notes (Deleted)
Patient's Name: Carla [name]  MRN: [id number]  Referring Provider: Kirk Ruths, MD  DOB: 08-28-1961  PCP: Kirk Ruths, MD  DOS: 04/17/2019  Note by: Gillis Santa, MD  Service setting: Ambulatory outpatient  Specialty: Interventional Pain Management  Location: [facility] (AMB) Pain Management Facility  Visit type: Initial Patient Evaluation  Patient type: New Patient   Primary Reason(s) for Visit: Encounter for initial evaluation of one or more chronic problems (new to examiner) potentially causing chronic pain, and posing a threat to normal musculoskeletal function. (Level of risk: High) CC: No chief complaint on file.  HPI  [name] is a 58 y.o. year old, female patient, who comes today to see [place] for the first time for an initial evaluation of her chronic pain. 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[place]); Acute right-sided low back pain with right-sided sciatica; Chronic knee pain (Primary Area of Pain) (Bilateral); Chronic pelvic pain in female; HTN (hypertension); Gastroesophageal reflux disease; Hyperlipidemia; Mixed stress and urge urinary incontinence; Morbid obesity with BMI of 40.0-44.9, adult ([place]); Chronic pain syndrome; Personal history of other malignant neoplasm of skin; Prediabetes; Osteoarthritis of knee (Bilateral); Urinary tract infection; Recurrent major depressive disorder ([place]); AKI (acute kidney injury) ([place]); Hypotension; SBO (small bowel obstruction) ([place]); Chronic mesenteric ischemia ([place]); Protein-calorie malnutrition, severe; Chronic idiopathic constipation; Adjustment disorder with anxiety; Accelerated hypertension; Acute encephalopathy; Paroxysmal atrial fibrillation ([place]); History of total knee replacement (Right); Aortic atherosclerosis (HCC); COPD (chronic obstructive pulmonary disease) ([place]); DDD  (degenerative disc disease), lumbosacral; Lumbar spondylosis; Moderate episode of recurrent major depressive disorder ([place]); Osteoarthritis of knee; Rib pain on left side; Ventral hernia with obstruction and without gangrene; Pharmacologic therapy; Disorder of skeletal system; Problems influencing health status; and Chronic low back pain (Secondary area of Pain) (Bilateral) w/o sciatica on their problem list. Today she comes in for evaluation of her No chief complaint on file.  Pain Assessment: Location:     Radiating:   Onset:   Duration:   Quality:   Severity:  /10 (subjective, self-reported pain score)  Note: Reported level is compatible with observation.                         When using our objective Pain Scale, levels between 6 and 10/10 are said to belong in an emergency room, as it progressively worsens from a 6/10, described as severely limiting, requiring emergency care not usually available at an outpatient pain management facility. At a 6/10 level, communication becomes difficult and requires great effort. Assistance to reach the emergency department may be required. Facial flushing and profuse sweating along with potentially dangerous increases in heart rate and blood pressure will be evident. Effect on ADL:   Timing:   Modifying factors:   BP:    HR:    Onset and Duration: {Hx; Onset and Duration:210120511} Cause of pain: {Hx; Cause:210120521} Severity: {Pain Severity:210120502} Timing: {Symptoms; Timing:210120501} Aggravating Factors: {Causes; Aggravating pain factors:210120507} Alleviating Factors: {Causes; Alleviating Factors:210120500} Associated Problems: {Hx; Associated problems:210120515} Quality of Pain: {Hx; Symptom quality or Descriptor:210120531} Previous Examinations or Tests: {Hx; Previous examinations or test:210120529} Previous Treatments: {Hx; Previous Treatment:210120503}  The patient comes into the clinics today for the first time for a chronic pain  management evaluation. ***  Today I took the time to provide the patient with information regarding my pain practice. The patient was informed  that my practice is divided into two sections: an interventional pain management section, as well as a completely separate and distinct medication management section. I explained that I have procedure days for my interventional therapies, and evaluation days for follow-ups and medication management. Because of the amount of documentation required during both, they are kept separated. This means that there is the possibility that she may be scheduled for a procedure on one day, and medication management the next. I have also informed her that because of staffing and facility limitations, I no longer take patients for medication management only. To illustrate the reasons for this, I gave the patient the example of surgeons, and how inappropriate it would be to refer a patient to his/her care, just to write for the post-surgical antibiotics on a surgery done by a different surgeon.   Because interventional pain management is my board-certified specialty, the patient was informed that joining my practice means that they are open to any and all interventional therapies. I made it clear that this does not mean that they will be forced to have any procedures done. What this means is that I believe interventional therapies to be essential part of the diagnosis and proper management of chronic pain conditions. Therefore, patients not interested in these interventional alternatives will be better served under the care of a different practitioner.  The patient was also made aware of my Comprehensive Pain Management Safety Guidelines where by joining my practice, they limit all of their nerve blocks and joint injections to those done by our practice, for as long as we are retained to manage their care.   Historic Controlled Substance Pharmacotherapy Review  PMP and historical  list of controlled substances: ***  Highest opioid analgesic regimen found: ***  Most recent opioid analgesic: ***  Current opioid analgesics: ***  Highest recorded MME/day: *** mg/day MME/day: *** mg/day Medications: The patient did not bring the medication(s) to the appointment, as requested in our "New Patient Package" Pharmacodynamics: Desired effects: Analgesia: The patient reports >50% benefit. Reported improvement in function: The patient reports medication allows her to accomplish basic ADLs. Clinically meaningful improvement in function (CMIF): Sustained CMIF goals met Perceived effectiveness: Described as relatively effective, allowing for increase in activities of daily living (ADL) Undesirable effects: Side-effects or Adverse reactions: None reported Historical Monitoring: The patient  reports no history of drug use. List of all UDS Test(s): Lab Results  Component Value Date   MDMA NONE DETECTED 04/07/2018   MDMA NONE DETECTED 07/29/2017   COCAINSCRNUR NONE DETECTED 04/07/2018   COCAINSCRNUR NONE DETECTED 07/29/2017   PCPSCRNUR NONE DETECTED 04/07/2018   PCPSCRNUR NONE DETECTED 07/29/2017   THCU NONE DETECTED 04/07/2018   THCU NONE DETECTED 07/29/2017   List of other Serum/Urine Drug Screening Test(s):  Lab Results  Component Value Date   COCAINSCRNUR NONE DETECTED 04/07/2018   COCAINSCRNUR NONE DETECTED 07/29/2017   THCU NONE DETECTED 04/07/2018   THCU NONE DETECTED 07/29/2017   Historical Background Evaluation: [place] PMP: PDMP not reviewed this encounter. Six (6) year initial data search conducted.             PMP NARX Score Report:  Narcotic: *** Sedative: *** Stimulant: ***  Department of public safety, offender search: Editor, commissioning Information) Non-contributory Risk Assessment Profile: Aberrant behavior: None observed or detected today Risk factors for fatal opioid overdose: None identified today PMP NARX Overdose Risk Score: *** Fatal overdose hazard ratio  (HR): Calculation deferred Non-fatal overdose hazard ratio (HR): Calculation deferred Risk of  opioid abuse or dependence: 0.7-3.0% with doses ? 36 MME/day and 6.1-26% with doses ? 120 MME/day. Substance use disorder (SUD) risk level: See below Personal History of Substance Abuse (SUD-Substance use disorder):  Alcohol:    Illegal Drugs:    Rx Drugs:    ORT Risk Level calculation:    ORT Scoring interpretation table:  Score <3 = Low Risk for SUD  Score between 4-7 = Moderate Risk for SUD  Score >8 = High Risk for Opioid Abuse   PHQ-2 Depression Scale:  Total score:    PHQ-2 Scoring interpretation table: (Score and probability of major depressive disorder)  Score 0 = No depression  Score 1 = 15.4% Probability  Score 2 = 21.1% Probability  Score 3 = 38.4% Probability  Score 4 = 45.5% Probability  Score 5 = 56.4% Probability  Score 6 = 78.6% Probability   PHQ-9 Depression Scale:  Total score:    PHQ-9 Scoring interpretation table:  Score 0-4 = No depression  Score 5-9 = Mild depression  Score 10-14 = Moderate depression  Score 15-19 = Moderately severe depression  Score 20-27 = Severe depression (2.4 times higher risk of SUD and 2.89 times higher risk of overuse)   Pharmacologic Plan: As per protocol, I have not taken over any controlled substance management, pending the results of ordered tests and/or consults.            Initial impression: Pending review of available data and ordered tests.  Meds   Current Outpatient Medications:  .  amLODipine (NORVASC) 5 MG tablet, Take 1 tablet (5 mg total) by mouth daily., Disp: 30 tablet, Rfl: 2 .  apixaban (ELIQUIS) 5 MG TABS tablet, Take 5 mg by mouth 2 (two) times daily., Disp: , Rfl:  .  atorvastatin (LIPITOR) 80 MG tablet, Take 80 mg by mouth at bedtime. , Disp: , Rfl:  .  clotrimazole-betamethasone (LOTRISONE) cream, Apply 1 application topically 2 (two) times daily as needed (for dry skin). , Disp: , Rfl:  .  diphenhydrAMINE  (BENADRYL) 25 MG tablet, Take 1 tablet (25 mg total) by mouth every 8 (eight) hours as needed for itching or allergies., Disp: 20 tablet, Rfl: 0 .  DULoxetine (CYMBALTA) 60 MG capsule, Take 60 mg by mouth daily., Disp: , Rfl:  .  ferrous sulfate 325 (65 FE) MG tablet, Take 325 mg by mouth daily., Disp: , Rfl:  .  metoprolol tartrate (LOPRESSOR) 50 MG tablet, Take 1 tablet (50 mg total) by mouth 2 (two) times daily., Disp: 30 tablet, Rfl: 0 .  omeprazole (PRILOSEC) 20 MG capsule, Take 20 mg by mouth 2 (two) times daily before a meal., Disp: , Rfl:  .  ondansetron (ZOFRAN) 4 MG tablet, Take 1 tablet (4 mg total) by mouth every 8 (eight) hours as needed. (Patient taking differently: Take 4 mg by mouth every 8 (eight) hours as needed for nausea or vomiting. ), Disp: 20 tablet, Rfl: 0  Imaging Review  Cervical Imaging: Cervical MR wo contrast: No results found for this or any previous visit. Cervical MR wo contrast: No procedure found. Cervical MR w/wo contrast: No results found for this or any previous visit. Cervical MR w contrast: No results found for this or any previous visit. Cervical CT wo contrast: No results found for this or any previous visit. Cervical CT w/wo contrast: No results found for this or any previous visit. Cervical CT w/wo contrast: No results found for this or any previous visit. Cervical CT w contrast:  No results found for this or any previous visit. Cervical CT outside: No results found for this or any previous visit. Cervical DG 1 view: No results found for this or any previous visit. Cervical DG 2-3 views: No results found for this or any previous visit. Cervical DG F/E views: No results found for this or any previous visit. Cervical DG 2-3 clearing views: No results found for this or any previous visit. Cervical DG Bending/F/E views: No results found for this or any previous visit. Cervical DG complete: No results found for this or any previous visit. Cervical DG  Myelogram views: No results found for this or any previous visit. Cervical DG Myelogram views: No results found for this or any previous visit. Cervical Discogram views: No results found for this or any previous visit.  Shoulder Imaging: Shoulder-R MR w contrast: No results found for this or any previous visit. Shoulder-L MR w contrast: No results found for this or any previous visit. Shoulder-R MR w/wo contrast: No results found for this or any previous visit. Shoulder-L MR w/wo contrast: No results found for this or any previous visit. Shoulder-R MR wo contrast: No results found for this or any previous visit. Shoulder-L MR wo contrast: No results found for this or any previous visit. Shoulder-R CT w contrast: No results found for this or any previous visit. Shoulder-L CT w contrast: No results found for this or any previous visit. Shoulder-R CT w/wo contrast: No results found for this or any previous visit. Shoulder-L CT w/wo contrast: No results found for this or any previous visit. Shoulder-R CT wo contrast: No results found for this or any previous visit. Shoulder-L CT wo contrast: No results found for this or any previous visit. Shoulder-R DG Arthrogram: No results found for this or any previous visit. Shoulder-L DG Arthrogram: No results found for this or any previous visit. Shoulder-R DG 1 view: No results found for this or any previous visit. Shoulder-L DG 1 view: No results found for this or any previous visit. Shoulder-R DG: No results found for this or any previous visit. Shoulder-L DG: No results found for this or any previous visit.  Thoracic Imaging: Thoracic MR wo contrast: No results found for this or any previous visit. Thoracic MR wo contrast: No procedure found. Thoracic MR w/wo contrast: No results found for this or any previous visit. Thoracic MR w contrast: No results found for this or any previous visit. Thoracic CT wo contrast: No results found for this or any  previous visit. Thoracic CT w/wo contrast: No results found for this or any previous visit. Thoracic CT w/wo contrast: No results found for this or any previous visit. Thoracic CT w contrast: No results found for this or any previous visit. Thoracic DG 2-3 views: No results found for this or any previous visit. Thoracic DG 4 views: No results found for this or any previous visit. Thoracic DG: No results found for this or any previous visit. Thoracic DG w/swimmers view: No results found for this or any previous visit. Thoracic DG Myelogram views: No results found for this or any previous visit. Thoracic DG Myelogram views: No results found for this or any previous visit.  Lumbosacral Imaging: Lumbar MR wo contrast: No results found for this or any previous visit. Lumbar MR wo contrast: No procedure found. Lumbar MR w/wo contrast: No results found for this or any previous visit. Lumbar MR w/wo contrast: No results found for this or any previous visit. Lumbar MR w contrast:  No results found for this or any previous visit. Lumbar CT wo contrast: No results found for this or any previous visit. Lumbar CT w/wo contrast: No results found for this or any previous visit. Lumbar CT w/wo contrast: No results found for this or any previous visit. Lumbar CT w contrast: No results found for this or any previous visit. Lumbar DG 1V: No results found for this or any previous visit. Lumbar DG 1V (Clearing): No results found for this or any previous visit. Lumbar DG 2-3V (Clearing): No results found for this or any previous visit. Lumbar DG 2-3 views:  Results for orders placed during the hospital encounter of 08/03/16  DG Lumbar Spine 2-3 Views   Narrative CLINICAL DATA:  Fall last night, right side pain  EXAM: LUMBAR SPINE - 2-3 VIEW  COMPARISON:  11/20/2015  FINDINGS: Three views of the lumbar spine submitted. No acute fracture or subluxation. Mild disc space flattening at L5-S1 level.  Alignment and vertebral body heights are preserved. Mild dextroscoliosis.  IMPRESSION: No acute fracture or subluxation. Mild disc space flattening at L5-S1 level.   Electronically Signed   By: Lahoma Crocker M.D.   On: 08/03/2016 10:52    Lumbar DG (Complete) 4+V:  Results for orders placed during the hospital encounter of 05/16/17  DG Lumbar Spine Complete   Narrative CLINICAL DATA:  Low back pain with radiation to the right leg beginning 3 days ago. No known injury.  EXAM: LUMBAR SPINE - COMPLETE 4+ VIEW  COMPARISON:  08/03/2016; 11/20/2015 ; CT abdomen pelvis - 04/19/2015  FINDINGS: There are 5 non rib-bearing lumbar type vertebral bodies.  There is a mild scoliotic curvature of the thoracolumbar spine with dominant caudal component convex the right measuring approximately 6 degrees (as measured from the superior endplate of L2 to the inferior endplate of L4). No anterolisthesis or retrolisthesis. No definite pars defects.  Lumbar vertebral body heights appear preserved.  Mild to moderate multilevel lumbar spine DDD, likely worse at L1-L2 with disc space height loss, endplate irregularity and sclerosis  Limited visualization of the bilateral SI joints is normal.  Post cholecystectomy. A tubal ligation clip versus a displaced cholecystectomy clip overlies the right hemipelvis, unchanged. Moderate colonic stool burden without evidence of enteric obstruction.  IMPRESSION: 1. Stable examination without interval change. 2. Mild scoliotic curvature of the thoracolumbar spine with associated mild to moderate multilevel lumbar spine DDD, likely worse at L1-L2.   Electronically Signed   By: Sandi Mariscal M.D.   On: 05/16/2017 08:07          Lumbar DG F/E views: No results found for this or any previous visit.       Lumbar DG Bending views: No results found for this or any previous visit.       Lumbar DG Myelogram views: No results found for this or any previous  visit. Lumbar DG Myelogram: No results found for this or any previous visit. Lumbar DG Myelogram: No results found for this or any previous visit. Lumbar DG Myelogram: No results found for this or any previous visit. Lumbar DG Myelogram Lumbosacral: No results found for this or any previous visit. Lumbar DG Diskogram views: No results found for this or any previous visit. Lumbar DG Diskogram views: No results found for this or any previous visit. Lumbar DG Epidurogram OP: No results found for this or any previous visit. Lumbar DG Epidurogram IP: No results found for this or any previous visit.  Sacroiliac Joint Imaging: Sacroiliac Joint  DG: No results found for this or any previous visit. Sacroiliac Joint MR w/wo contrast: No results found for this or any previous visit. Sacroiliac Joint MR wo contrast: No results found for this or any previous visit.  Spine Imaging: Whole Spine DG Myelogram views: No results found for this or any previous visit. Whole Spine MR Mets screen: No results found for this or any previous visit. Whole Spine MR Mets screen: No results found for this or any previous visit. Whole Spine MR w/wo: No results found for this or any previous visit. MRA Spinal Canal w/ cm: No results found for this or any previous visit. MRA Spinal Canal wo/ cm: No procedure found. MRA Spinal Canal w/wo cm: No results found for this or any previous visit. Spine Outside MR Films: No results found for this or any previous visit. Spine Outside CT Films: No results found for this or any previous visit. CT-Guided Biopsy: No results found for this or any previous visit. CT-Guided Needle Placement: No results found for this or any previous visit. DG Spine outside: No results found for this or any previous visit. IR Spine outside: No results found for this or any previous visit. NM Spine outside: No results found for this or any previous visit.  Hip Imaging: Hip-R MR w contrast: No results  found for this or any previous visit. Hip-L MR w contrast: No results found for this or any previous visit. Hip-R MR w/wo contrast: No results found for this or any previous visit. Hip-L MR w/wo contrast: No results found for this or any previous visit. Hip-R MR wo contrast: No results found for this or any previous visit. Hip-L MR wo contrast: No results found for this or any previous visit. Hip-R CT w contrast: No results found for this or any previous visit. Hip-L CT w contrast: No results found for this or any previous visit. Hip-R CT w/wo contrast: No results found for this or any previous visit. Hip-L CT w/wo contrast: No results found for this or any previous visit. Hip-R CT wo contrast: No results found for this or any previous visit. Hip-L CT wo contrast: No results found for this or any previous visit. Hip-R DG 2-3 views:  Results for orders placed during the hospital encounter of 05/05/17  DG HIP UNILAT WITH PELVIS 2-3 VIEWS RIGHT   Narrative CLINICAL DATA:  RIGHT hip pain for 3 days  EXAM: DG HIP (WITH OR WITHOUT PELVIS) 2-3V RIGHT  COMPARISON:  None  FINDINGS: Mild osseous demineralization.  Hip and SI joint spaces symmetric and preserved.  No acute fracture, dislocation, or bone destruction.  Single surgical clip projects over pelvis.  IMPRESSION: No acute osseous abnormalities.   Electronically Signed   By: Lavonia Dana M.D.   On: 05/05/2017 19:26    Hip-L DG 2-3 views:  Results for orders placed during the hospital encounter of 04/07/15  DG HIP UNILAT WITH PELVIS 2-3 VIEWS LEFT   Narrative CLINICAL DATA:  Acute left hip pain  EXAM: LEFT HIP (WITH PELVIS) 2-3 VIEWS  COMPARISON:  None.  FINDINGS: There is no evidence of hip fracture or dislocation. There is no evidence of arthropathy or other focal bone abnormality.  IMPRESSION: Negative.   Electronically Signed   By: Kathreen Devoid   On: 04/07/2015 12:35    Hip-R DG Arthrogram: No results  found for this or any previous visit. Hip-L DG Arthrogram: No results found for this or any previous visit. Hip-B DG Bilateral:  Results  for orders placed during the hospital encounter of 07/27/15  DG HIPS BILAT WITH PELVIS 2V   Narrative CLINICAL DATA:  Pain following fall  EXAM: DG HIP (WITH OR WITHOUT PELVIS) 4+V BILAT  COMPARISON:  None.  FINDINGS: Frontal pelvis as well as frontal and lateral hip images bilaterally obtained. There is no fracture or dislocation. Joint spaces appear intact. No erosive change. There is a surgical clip in the right pelvis.  IMPRESSION: No fracture or dislocation.  No appreciable arthropathy.   Electronically Signed   By: Lowella Grip III M.D.   On: 07/27/2015 15:13     Knee Imaging: Knee-R MR w contrast: No results found for this or any previous visit. Knee-L MR w contrast: No results found for this or any previous visit. Knee-R MR w/wo contrast: No results found for this or any previous visit. Knee-L MR w/wo contrast: No results found for this or any previous visit. Knee-R MR wo contrast: No results found for this or any previous visit. Knee-L MR wo contrast: No results found for this or any previous visit. Knee-R CT w contrast: No results found for this or any previous visit. Knee-L CT w contrast: No results found for this or any previous visit. Knee-R CT w/wo contrast: No results found for this or any previous visit. Knee-L CT w/wo contrast: No results found for this or any previous visit. Knee-R CT wo contrast: No results found for this or any previous visit. Knee-L CT wo contrast: No results found for this or any previous visit. Knee-R DG 1-2 views:  Results for orders placed during the hospital encounter of 05/05/17  DG Knee 2 Views Right   Narrative CLINICAL DATA:  Pain in RIGHT knee, history of arthritis  EXAM: RIGHT KNEE - 1-2 VIEW  COMPARISON:  08/03/2016  FINDINGS: Osseous demineralization.  Tricompartmental  osteoarthritic changes with joint space narrowing and spur formation.  No acute fracture, dislocation or bone destruction.  No knee joint effusion.  IMPRESSION: Osseous demineralization with tricompartmental osteoarthritic changes RIGHT knee.  No acute abnormalities   Electronically Signed   By: Lavonia Dana M.D.   On: 05/05/2017 19:27    Knee-L DG 1-2 views: No results found for this or any previous visit. Knee-R DG 3 views: No results found for this or any previous visit. Knee-L DG 3 views: No results found for this or any previous visit. Knee-R DG 4 views:  Results for orders placed during the hospital encounter of 08/03/16  DG Knee Complete 4 Views Right   Narrative CLINICAL DATA:  Right knee pain after multiple falls.  EXAM: RIGHT KNEE - COMPLETE 4+ VIEW  COMPARISON:  None.  FINDINGS: No fracture, dislocation, joint effusion or suspicious focal osseous lesion. Moderate tricompartmental osteoarthritis, most prominent in the patellofemoral and medial compartments. No radiopaque foreign body.  IMPRESSION: No fracture, joint effusion or dislocation. Moderate tricompartmental osteoarthritis in the right knee.   Electronically Signed   By: Ilona Sorrel M.D.   On: 08/03/2016 10:57    Knee-L DG 4 views:  Results for orders placed during the hospital encounter of 11/20/15  DG Knee Complete 4 Views Left   Narrative CLINICAL DATA:  Fall.  Pain in both anterior knees.  EXAM: LEFT KNEE - COMPLETE 4+ VIEW  COMPARISON:  07/27/2015  FINDINGS: There is no evidence of fracture, dislocation, or joint effusion. There is no evidence of arthropathy or other focal bone abnormality. Soft tissues are unremarkable.  IMPRESSION: Negative.   Electronically Signed  By: Rolm Baptise M.D.   On: 11/20/2015 17:52    Knee-R DG Arthrogram: No results found for this or any previous visit. Knee-L DG Arthrogram: No results found for this or any previous visit.  Ankle  Imaging: Ankle-R DG Complete: No results found for this or any previous visit. Ankle-L DG Complete: No results found for this or any previous visit.  Foot Imaging: Foot-R DG Complete: No results found for this or any previous visit. Foot-L DG Complete: No results found for this or any previous visit.  Elbow Imaging: Elbow-R DG Complete: No results found for this or any previous visit. Elbow-L DG Complete: No results found for this or any previous visit.  Wrist Imaging: Wrist-R DG Complete: No results found for this or any previous visit. Wrist-L DG Complete: No results found for this or any previous visit.  Hand Imaging: Hand-R DG Complete: No results found for this or any previous visit. Hand-L DG Complete: No results found for this or any previous visit.  Complexity Note: Imaging results reviewed. Results shared with Ms. Donzetta Matters, using Layman's terms.                         ROS  Cardiovascular: {Hx; Cardiovascular History:210120525} Pulmonary or Respiratory: {Hx; Pumonary and/or Respiratory History:210120523} Neurological: {Hx; Neurological:210120504} Review of Past Neurological Studies:  Results for orders placed or performed during the hospital encounter of 04/07/18  MR BRAIN WO CONTRAST   Narrative   CLINICAL DATA:  Altered level of consciousness.  EXAM: MRI HEAD WITHOUT CONTRAST  MRA HEAD WITHOUT CONTRAST  TECHNIQUE: Multiplanar, multiecho pulse sequences of the brain and surrounding structures were obtained without intravenous contrast. Angiographic images of the head were obtained using MRA technique without contrast.  COMPARISON:  07/23/2016  FINDINGS: MRI HEAD FINDINGS  Brain: No acute infarction, hemorrhage, hydrocephalus, extra-axial collection or mass lesion. Patchy FLAIR hyperintensity in the cerebral white matter that is stable from prior. Pattern is nonspecific, medical history suggests chronic small vessel ischemia. Normal brain volume.  Vascular:  Normal dural venous sinus flow voids. Arterial findings noted below.  Skull and upper cervical spine: No evidence of marrow lesion  Sinuses/Orbits: Negative  MRA HEAD FINDINGS  The carotid, vertebral, and basilar arteries are smooth and diffusely patent. Fetal type left PCA. An nterior communicating artery is present. Accounting for motion artifact no occlusion or flow limiting stenosis is suspected in the major anterior and posterior circulation vessels. No convincing aneurysm.  IMPRESSION: Brain MRI:  1. No acute finding or change from 2017. 2. Motion degraded. 3. Mild white matter disease.  Intracranial MRA:  Negative when accounting for motion artifact.   Electronically Signed   By: Monte Fantasia M.D.   On: 04/08/2018 10:59   MR MRA HEAD WO CONTRAST   Narrative   CLINICAL DATA:  Altered level of consciousness.  EXAM: MRI HEAD WITHOUT CONTRAST  MRA HEAD WITHOUT CONTRAST  TECHNIQUE: Multiplanar, multiecho pulse sequences of the brain and surrounding structures were obtained without intravenous contrast. Angiographic images of the head were obtained using MRA technique without contrast.  COMPARISON:  07/23/2016  FINDINGS: MRI HEAD FINDINGS  Brain: No acute infarction, hemorrhage, hydrocephalus, extra-axial collection or mass lesion. Patchy FLAIR hyperintensity in the cerebral white matter that is stable from prior. Pattern is nonspecific, medical history suggests chronic small vessel ischemia. Normal brain volume.  Vascular: Normal dural venous sinus flow voids. Arterial findings noted below.  Skull and upper cervical spine: No evidence of  marrow lesion  Sinuses/Orbits: Negative  MRA HEAD FINDINGS  The carotid, vertebral, and basilar arteries are smooth and diffusely patent. Fetal type left PCA. An nterior communicating artery is present. Accounting for motion artifact no occlusion or flow limiting stenosis is suspected in the major anterior  and posterior circulation vessels. No convincing aneurysm.  IMPRESSION: Brain MRI:  1. No acute finding or change from 2017. 2. Motion degraded. 3. Mild white matter disease.  Intracranial MRA:  Negative when accounting for motion artifact.   Electronically Signed   By: Monte Fantasia M.D.   On: 04/08/2018 10:59   CT Head Wo Contrast   Narrative   CLINICAL DATA:  Altered level consciousness  EXAM: CT HEAD WITHOUT CONTRAST  TECHNIQUE: Contiguous axial images were obtained from the base of the skull through the vertex without intravenous contrast.  COMPARISON:  Head CT 07/23/2016  FINDINGS: Brain: No acute intracranial hemorrhage. No focal mass lesion. No CT evidence of acute infarction. No midline shift or mass effect. No hydrocephalus. Basilar cisterns are patent.  Vascular: No hyperdense vessel or unexpected calcification.  Skull: Normal. Negative for fracture or focal lesion.  Sinuses/Orbits: Paranasal sinuses and mastoid air cells are clear. Orbits are clear.  Other: None.  IMPRESSION: No acute intracranial findings.   Electronically Signed   By: Suzy Bouchard M.D.   On: 04/07/2018 22:23    Psychological-Psychiatric: {Hx; Psychological-Psychiatric History:210120512} Gastrointestinal: {Hx; Gastrointestinal:210120527} Genitourinary: {Hx; Genitourinary:210120506} Hematological: {Hx; Hematological:210120510} Endocrine: {Hx; Endocrine history:210120509} Rheumatologic: {Hx; Rheumatological:210120530} Musculoskeletal: {Hx; Musculoskeletal:210120528} Work History: {Hx; Work history:210120514}  Allergies  Ms. Hove is allergic to bactrim [sulfamethoxazole-trimethoprim].  Laboratory Chemistry   SAFETY SCREENING Profile Lab Results  Component Value Date   STAPHAUREUS NEGATIVE 07/08/2018   MRSAPCR NEGATIVE 07/08/2018   HIV Non Reactive 02/04/2019   Inflammation Markers (CRP: Acute Phase) (ESR: Chronic Phase) Lab Results  Component Value Date    ESRSEDRATE 49 (H) 07/29/2017   LATICACIDVEN 1.9 09/17/2018                         Rheumatology Markers No results found for: RF, ANA, LABURIC, URICUR, LYMEIGGIGMAB, LYMEABIGMQN, HLAB27                      Renal Function Markers Lab Results  Component Value Date   BUN 27 (H) 02/03/2019   CREATININE 1.09 (H) 02/03/2019   GFRAA >60 02/03/2019   GFRNONAA 56 (L) 02/03/2019                             Hepatic Function Markers Lab Results  Component Value Date   AST 14 (L) 02/03/2019   ALT 15 02/03/2019   ALBUMIN 4.0 02/03/2019   ALKPHOS 113 02/03/2019   LIPASE 35 11/10/2018   AMMONIA 28 04/08/2018                        Electrolytes Lab Results  Component Value Date   NA 134 (L) 02/03/2019   K 4.2 02/03/2019   CL 103 02/03/2019   CALCIUM 9.0 02/03/2019   MG 2.3 01/31/2018   PHOS 3.9 08/16/2017                        Neuropathy Markers Lab Results  Component Value Date   HGBA1C 5.7 07/22/2012   HIV Non Reactive 02/04/2019  CNS Tests No results found for: COLORCSF, APPEARCSF, RBCCOUNTCSF, WBCCSF, POLYSCSF, LYMPHSCSF, EOSCSF, PROTEINCSF, GLUCCSF, JCVIRUS, CSFOLI, IGGCSF, LABACHR, ACETBL                      Bone Pathology Markers No results found for: VD25OH, [id number], [id number], [id number], 25OHVITD1, 25OHVITD2, 25OHVITD3, TESTOFREE, TESTOSTERONE                       Coagulation Parameters Lab Results  Component Value Date   INR 1.04 07/08/2018   LABPROT 13.5 07/08/2018   APTT 29 02/15/2018   PLT 366 02/03/2019                        Cardiovascular Markers Lab Results  Component Value Date   BNP 42 02/12/2015   CKTOTAL 37 12/22/2012   CKMB < 0.5 (L) 12/22/2012   TROPONINI <0.03 02/03/2019   HGB 16.5 (H) 02/03/2019   HCT 49.6 (H) 02/03/2019                         ID Test(s) Lab Results  Component Value Date   HIV Non Reactive 02/04/2019   STAPHAUREUS NEGATIVE 07/08/2018   MRSAPCR NEGATIVE 07/08/2018   MICROTEXT   01/08/2014       SOURCE: CLEAN CATCH    ORGANISM 1                >100,000 CFU/ML ESCHERICHIA COLI   ANTIBIOTIC                    ORG#1     AMPICILLIN                    R         CEFAZOLIN                     S         CEFOXITIN                     S         CEFTRIAXONE                   S         CIPROFLOXACIN                 S         GENTAMICIN                    S         IMIPENEM                      S         LEVOFLOXACIN                  S         NITROFURANTOIN                S         TRIMETHOPRIM/SULFAMETHOXAZOLE S             CA Markers No results found for: CEA, CA125, LABCA2                      Endocrine Markers Lab Results  Component Value Date   TSH 2.823 04/08/2018  Note: Lab results reviewed.  [place]  Drug: Ms. Novitsky  reports no history of drug use. Alcohol:  reports no history of alcohol use. Tobacco:  reports that she has been smoking cigarettes. She has been smoking about 0.50 packs per day. She has never used smokeless tobacco. 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[place]), Essential hypertension with goal blood pressure less than 140/90 (01/19/2016), Gastroesophageal reflux disease (01/19/2016), GERD (gastroesophageal reflux disease), History of kidney stones, Hyperlipidemia, Hypertension, Hypotension (07/29/2017), Morbid obesity with BMI of 40.0-44.9, adult ([place]), Occlusive mesenteric ischemia ([place]) (06/19/2017), Personal history of other malignant neoplasm of skin  (06/01/2016), Primary osteoarthritis of both knees (06/13/2017), Recurrent major depressive disorder ([place]) (01/19/2016), SBO (small bowel obstruction) ([place]) (08/06/2017), 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 Family: family history includes Breast cancer in her mother and paternal aunt; Heart attack in her mother; Heart disease in her mother; Hypertension in her father and mother.  Past Surgical History:  Procedure Laterality Date  . APPENDECTOMY    . CHOLECYSTECTOMY    . LAPAROTOMY N/A 08/08/2017   Procedure: EXPLORATORY LAPAROTOMY POSSIBLE BOWEL RESECTION;  Surgeon: Jules Husbands, MD;  Location: [facility];  Service: General;  Laterality: N/A;  . TEE WITHOUT CARDIOVERSION N/A 06/22/2017   Procedure: TRANSESOPHAGEAL ECHOCARDIOGRAM (TEE);  Surgeon: Wellington Hampshire, MD;  Location: [facility];  Service: Cardiovascular;  Laterality: N/A;  . TOTAL KNEE ARTHROPLASTY Right 07/11/2018   Procedure: TOTAL KNEE ARTHROPLASTY;  Surgeon: Corky Mull, MD;  Location: [facility];  Service: Orthopedics;  Laterality: Right;  [name] VAGINAL HYSTERECTOMY    . VISCERAL ARTERY INTERVENTION N/A 06/20/2017   Procedure: Visceral Artery Intervention, possible aortic thrombectomy;  Surgeon: Algernon Huxley, MD;  Location: [facility];  Service: Cardiovascular;  Laterality: N/A;  . VISCERAL ARTERY INTERVENTION N/A 01/28/2018   Procedure: VISCERAL ARTERY INTERVENTION;  Surgeon: Algernon Huxley, MD;  Location: [facility];  Service: Cardiovascular;  Laterality: N/A;   Active Ambulatory Problems    Diagnosis Date Noted  . Chest pain 08/06/2012  . Occlusive mesenteric ischemia ([place]) 06/19/2017  . 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 Acute focal ischemia of small intestine ([facility])   . Abdominal pain   . 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 Acute right-sided low back pain with right-sided sciatica 06/13/2017  . Chronic knee pain (Primary Area of Pain)  (Bilateral) 01/19/2016  . Chronic pelvic pain in female 11/14/2013  . HTN (hypertension) 01/19/2016  . Gastroesophageal reflux disease 01/19/2016  . Hyperlipidemia 01/19/2016  . Mixed stress and urge urinary incontinence 11/14/2013  . Morbid obesity with BMI of 40.0-44.9, adult ([place]) 07/16/2016  . Chronic pain syndrome 09/13/2016  . Personal history of other malignant neoplasm of skin 06/01/2016  . Prediabetes 01/19/2016  . Osteoarthritis of knee (Bilateral) 06/13/2017  . Urinary tract infection 01/09/2014  . Recurrent major depressive disorder ([place]) 01/19/2016  . AKI (acute kidney injury) ([place]) 07/29/2017  . Hypotension 07/29/2017  . SBO (small bowel obstruction) ([place]) 08/06/2017  . Chronic mesenteric ischemia ([place])   . Protein-calorie malnutrition, severe 08/08/2017  . Chronic idiopathic constipation   .  Adjustment disorder with anxiety 08/15/2017  . Accelerated hypertension 04/08/2018  . Acute encephalopathy 04/08/2018  . Paroxysmal atrial fibrillation ([place]) 06/15/2018  . History of total knee replacement (Right) 07/11/2018  . Aortic atherosclerosis ([place]) 09/12/2018  . COPD (chronic obstructive pulmonary disease) ([place]) 09/12/2018  . DDD (degenerative disc disease), lumbosacral 09/25/2017  . Lumbar spondylosis 09/06/2018  . Moderate episode of recurrent major depressive disorder ([place]) 09/12/2018  . Osteoarthritis of knee 09/25/2017  . Rib pain on left side 03/25/2019  . Ventral hernia with obstruction and without gangrene 07/02/2018  . Pharmacologic therapy 03/26/2019  . Disorder of skeletal system 03/26/2019  . Problems influencing health status 03/26/2019  . Chronic low back pain (Secondary area of Pain) (Bilateral) w/o sciatica 03/26/2019   Resolved Ambulatory Problems    Diagnosis Date Noted  . No Resolved Ambulatory Problems   Past Medical History:  Diagnosis Date  . Abdominal aortic atherosclerosis ([place])   . Baker's cyst of knee, right   . Bell's palsy   .  Borderline diabetes   . Cancer ([place])   . CHF (congestive heart failure) ([place])   . Diastolic dysfunction   . Essential hypertension with goal blood pressure less than 140/90 01/19/2016  . GERD (gastroesophageal reflux disease)   . History of kidney stones   . Hypertension    Constitutional Exam  General appearance: Well nourished, well developed, and well hydrated. In no apparent acute distress There were no vitals filed for this visit. BMI Assessment: Estimated body mass index is 36.58 kg/m as calculated from the following:   Height as of 02/03/19: '5\' 2"'$  (1.575 m).   Weight as of 02/03/19: 200 lb (90.7 kg).  BMI interpretation table: BMI level Category Range association with higher incidence of chronic pain  <18 kg/m2 Underweight   18.5-24.9 kg/m2 Ideal body weight   25-29.9 kg/m2 Overweight Increased incidence by 20%  30-34.9 kg/m2 Obese (Class I) Increased incidence by 68%  35-39.9 kg/m2 Severe obesity (Class II) Increased incidence by 136%  >40 kg/m2 Extreme obesity (Class III) Increased incidence by 254%   Patient's current BMI Ideal Body weight  There is no height or weight on file to calculate BMI. Patient weight not recorded   BMI Readings from Last 4 Encounters:  02/03/19 36.58 kg/m  11/10/18 37.86 kg/m  09/24/18 37.68 kg/m  09/19/18 37.68 kg/m   Wt Readings from Last 4 Encounters:  02/03/19 200 lb (90.7 kg)  11/10/18 207 lb (93.9 kg)  09/24/18 206 lb (93.4 kg)  09/19/18 206 lb (93.4 kg)  Psych/Mental status: Alert, oriented x 3 (person, place, & time)       Eyes: PERLA Respiratory: No evidence of acute respiratory distress  Cervical Spine Area Exam  Skin & Axial Inspection: No masses, redness, edema, swelling, or associated skin lesions Alignment: Symmetrical Functional ROM: Unrestricted ROM      Stability: No instability detected Muscle Tone/Strength: Functionally intact. No obvious neuro-muscular anomalies detected. Sensory (Neurological):  Unimpaired Palpation: No palpable anomalies              Upper Extremity (UE) Exam    Side: Right upper extremity  Side: Left upper extremity  Skin & Extremity Inspection: Skin color, temperature, and hair growth are WNL. No peripheral edema or cyanosis. No masses, redness, swelling, asymmetry, or associated skin lesions. No contractures.  Skin & Extremity Inspection: Skin color, temperature, and hair growth are WNL. No peripheral edema or cyanosis. No masses, redness, swelling, asymmetry, or associated skin lesions. No contractures.  Functional  ROM: Unrestricted ROM          Functional ROM: Unrestricted ROM          Muscle Tone/Strength: Functionally intact. No obvious neuro-muscular anomalies detected.  Muscle Tone/Strength: Functionally intact. No obvious neuro-muscular anomalies detected.  Sensory (Neurological): Unimpaired          Sensory (Neurological): Unimpaired          Palpation: No palpable anomalies              Palpation: No palpable anomalies              Provocative Test(s):  Phalen's test: deferred Tinel's test: deferred Apley's scratch test (touch opposite shoulder):  Action 1 (Across chest): deferred Action 2 (Overhead): deferred Action 3 (LB reach): deferred   Provocative Test(s):  Phalen's test: deferred Tinel's test: deferred Apley's scratch test (touch opposite shoulder):  Action 1 (Across chest): deferred Action 2 (Overhead): deferred Action 3 (LB reach): deferred    Thoracic Spine Area Exam  Skin & Axial Inspection: No masses, redness, or swelling Alignment: Symmetrical Functional ROM: Unrestricted ROM Stability: No instability detected Muscle Tone/Strength: Functionally intact. No obvious neuro-muscular anomalies detected. Sensory (Neurological): Unimpaired Muscle strength & Tone: No palpable anomalies  Lumbar Spine Area Exam  Skin & Axial Inspection: No masses, redness, or swelling Alignment: Symmetrical Functional ROM: Unrestricted ROM        Stability: No instability detected Muscle Tone/Strength: Functionally intact. No obvious neuro-muscular anomalies detected. Sensory (Neurological): Unimpaired Palpation: No palpable anomalies       Provocative Tests: Hyperextension/rotation test: deferred today       Lumbar quadrant test (Kemp's test): deferred today       Lateral bending test: deferred today       Patrick's Maneuver: deferred today                   FABER* test: deferred today                   S-I anterior distraction/compression test: deferred today         S-I lateral compression test: deferred today         S-I Thigh-thrust test: deferred today         S-I Gaenslen's test: deferred today         *(Flexion, ABduction and External Rotation)  Gait & Posture Assessment  Ambulation: Unassisted Gait: Relatively normal for age and body habitus Posture: WNL   Lower Extremity Exam    Side: Right lower extremity  Side: Left lower extremity  Stability: No instability observed          Stability: No instability observed          Skin & Extremity Inspection: Skin color, temperature, and hair growth are WNL. No peripheral edema or cyanosis. No masses, redness, swelling, asymmetry, or associated skin lesions. No contractures.  Skin & Extremity Inspection: Skin color, temperature, and hair growth are WNL. No peripheral edema or cyanosis. No masses, redness, swelling, asymmetry, or associated skin lesions. No contractures.  Functional ROM: Unrestricted ROM                  Functional ROM: Unrestricted ROM                  Muscle Tone/Strength: Functionally intact. No obvious neuro-muscular anomalies detected.  Muscle Tone/Strength: Functionally intact. No obvious neuro-muscular anomalies detected.  Sensory (Neurological): Unimpaired        Sensory (Neurological): Unimpaired  DTR: Patellar: deferred today Achilles: deferred today Plantar: deferred today  DTR: Patellar: deferred today Achilles: deferred today Plantar:  deferred today  Palpation: No palpable anomalies  Palpation: No palpable anomalies   Assessment  Primary Diagnosis & Pertinent Problem List: There were no encounter diagnoses.  Visit Diagnosis (New problems to examiner): No diagnosis found. Plan of Care (Initial workup plan)  Note: Ms. Artiga was reminded that as per protocol, today's visit has been an evaluation only. We have not taken over the patient's controlled substance management.  Problem-specific plan: No problem-specific Assessment & Plan notes found for this encounter.  Lab Orders  No laboratory test(s) ordered today   Imaging Orders  No imaging studies ordered today   Referral Orders  No referral(s) requested today   Procedure Orders    No procedure(s) ordered today   Pharmacotherapy (current): Medications ordered:  No orders of the defined types were placed in this encounter.  Medications administered during this visit: Arriyanna Mersch. Faught had no medications administered during this visit.   Pharmacological management options:  Opioid Analgesics: The patient was informed that there is no guarantee that she would be a candidate for opioid analgesics. The decision will be made following CDC guidelines. This decision will be based on the results of diagnostic studies, as well as Ms. Pisarski risk profile.   Membrane stabilizer: To be determined at a later time  Muscle relaxant: To be determined at a later time  NSAID: To be determined at a later time  Other analgesic(s): To be determined at a later time   Interventional management options: Ms. Codd was informed that there is no guarantee that she would be a candidate for interventional therapies. The decision will be based on the results of diagnostic studies, as well as Ms. Pestka risk profile.  Procedure(s) under consideration:  ***   Provider-requested follow-up: No follow-ups on file.  Future Appointments  Date Time Provider [place]  04/17/2019 11:45 AM  Gillis Santa, MD [facility] None    Primary Care Physician: Kirk Ruths, MD Location: [facility] Note by: Gillis Santa, MD Date: 04/17/2019; Time: 10:49 AM  Note: This dictation was prepared with Dragon dictation. Any transcriptional errors that may result from this process are unintentional.

## 2019-04-16 NOTE — Telephone Encounter (Signed)
Called patient x 2 and left message to call so we could go over some information.

## 2019-04-17 ENCOUNTER — Ambulatory Visit: Payer: Medicaid Other | Admitting: Student in an Organized Health Care Education/Training Program

## 2019-07-10 IMAGING — CT CT CTA ABD/PEL W/CM AND/OR W/O CM
3 of 9 series · 11 of 46 positions shown, 17 images · IV contrast (iopamidol)
Comparison: 08/15/2017 CT abdomen and pelvis.

CLINICAL DATA: 57 y/o F; abdominal pain and blood in the stool.
History of bowel resection and abdominal aortic aneurysm.

EXAM:
CTA ABDOMEN AND PELVIS wITHOUT AND WITH CONTRAST
TECHNIQUE: Multidetector CT imaging of the abdomen and pelvis was performed
using the standard protocol during bolus administration of
intravenous contrast. Multiplanar reconstructed images and MIPs were
obtained and reviewed to evaluate the vascular anatomy.
CONTRAST:  100mL FVI2S5-TZN IOPAMIDOL (FVI2S5-TZN) INJECTION 76%

[Series 4: axial arterial · axial · arterial · 0.96mm/px · z∈[-954,-854]mm · 3 of 237 slices shown]
[im 25/237  soft-tissue]
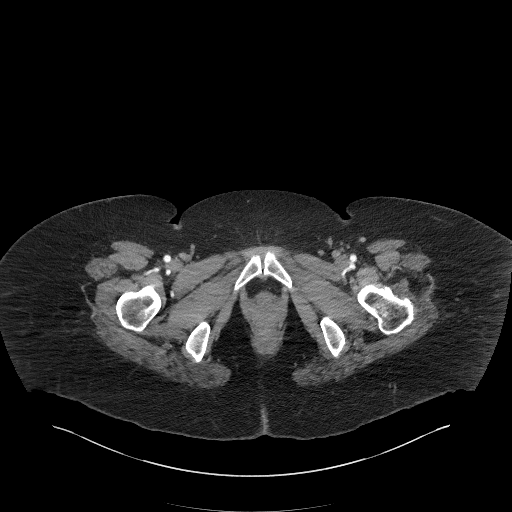
[im 50/237  soft-tissue]
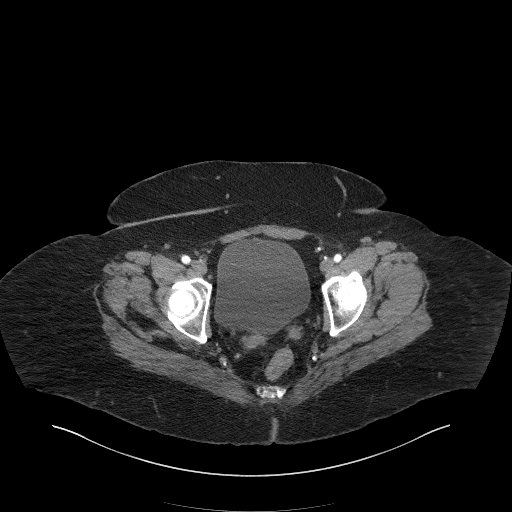
[im 75/237  soft-tissue]
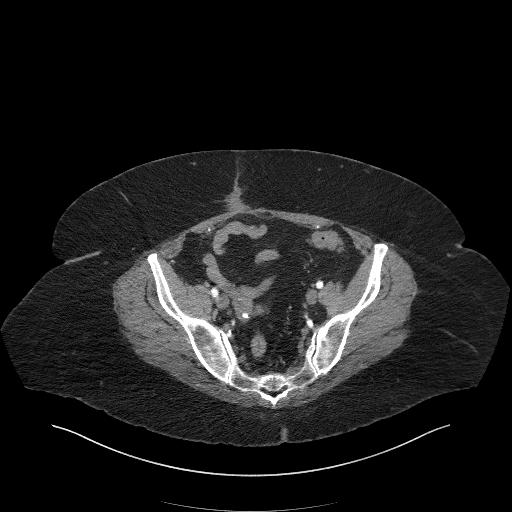

[Series 5: axial venous · axial · portal-venous · 0.96mm/px · z∈[-925,-600]mm · 6 of 93 slices shown, 11 images]
[im 14/93  soft-tissue]
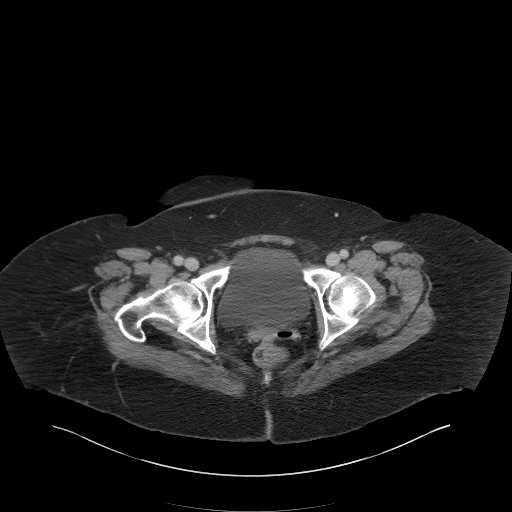
[im 14/93  bone]
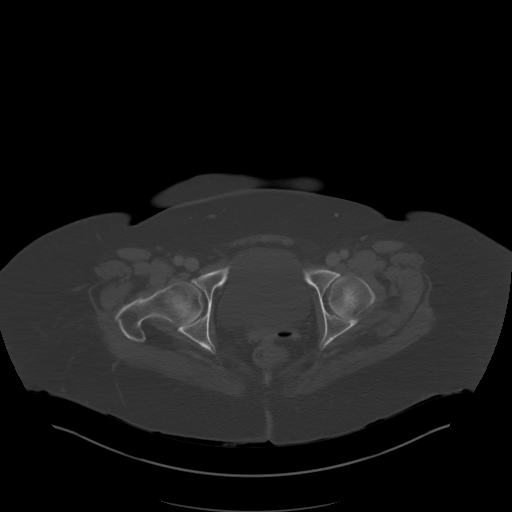
[im 27/93  soft-tissue]
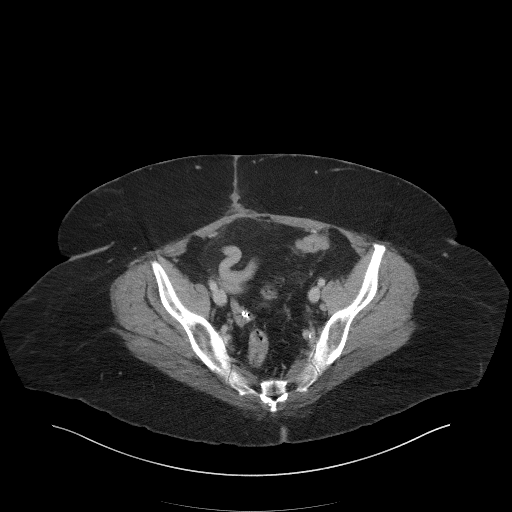
[im 40/93  soft-tissue]
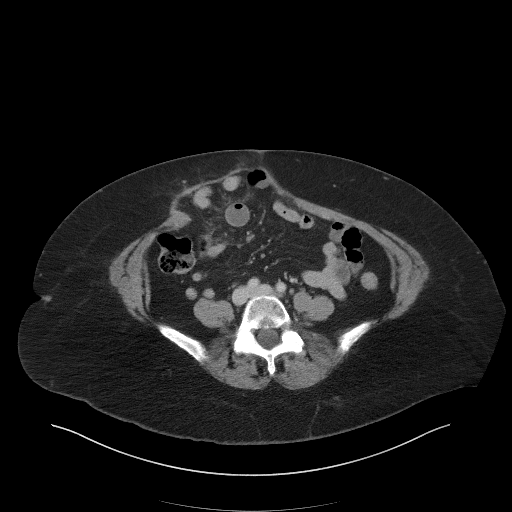
[im 40/93  lung]
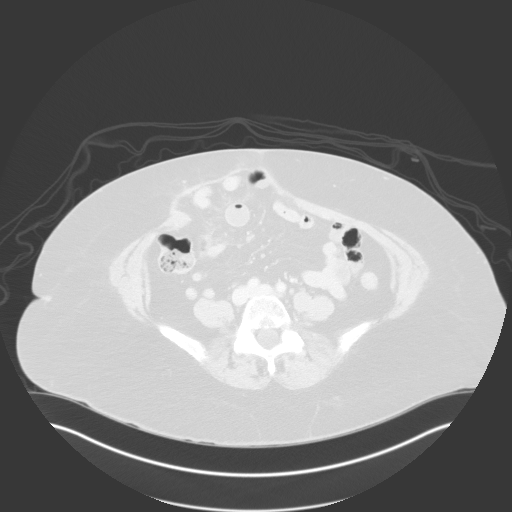
[im 53/93  soft-tissue]
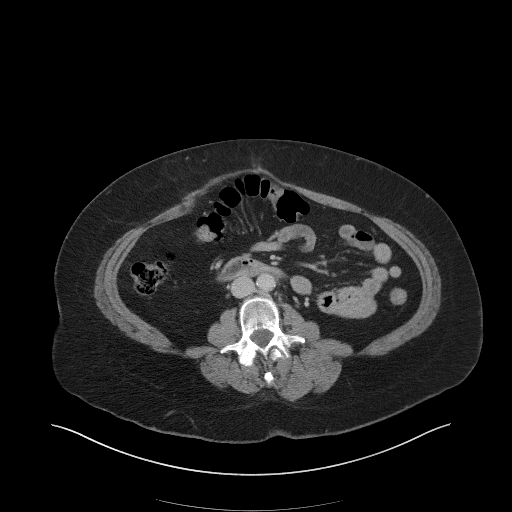
[im 53/93  lung]
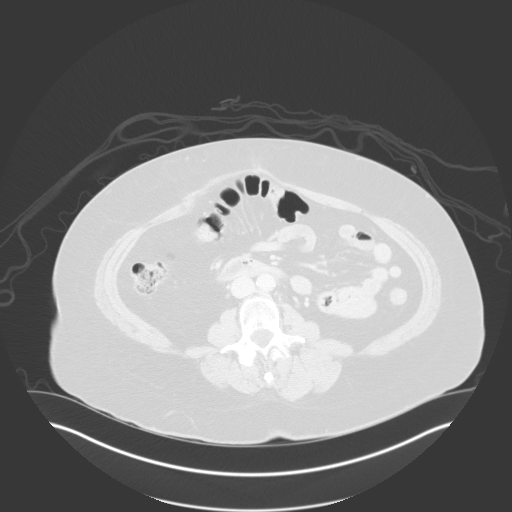
[im 66/93  soft-tissue]
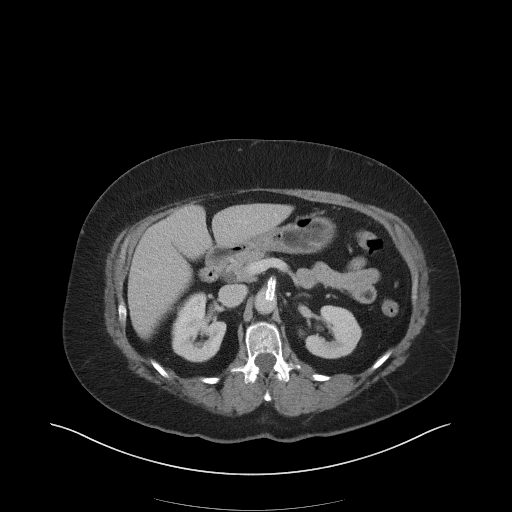
[im 66/93  lung]
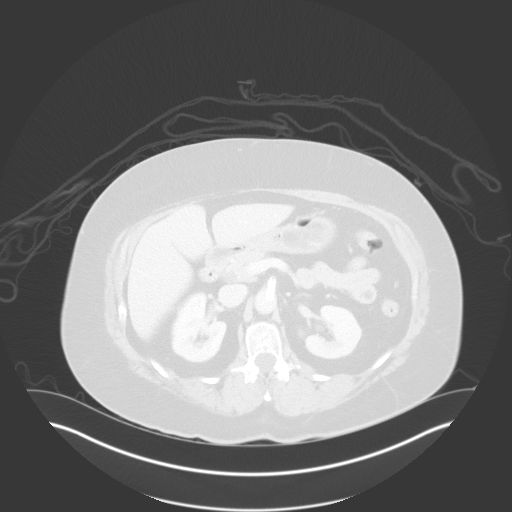
[im 79/93  soft-tissue]
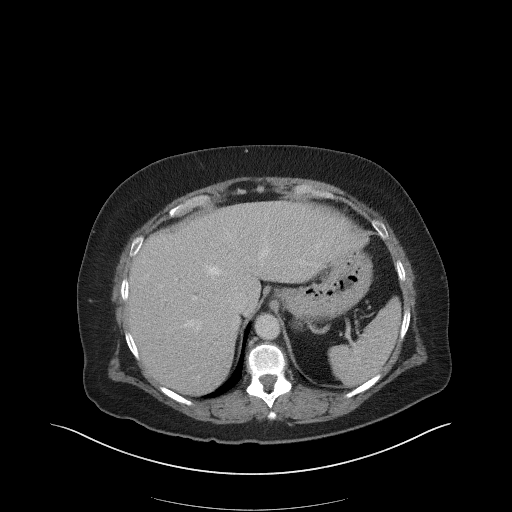
[im 79/93  lung]
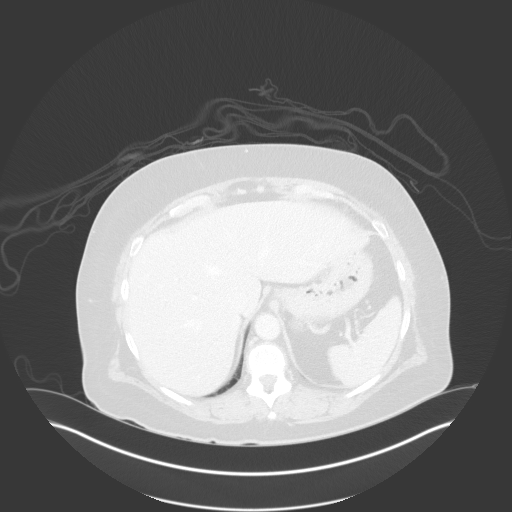

[Series 13: coronal venous mpr · coronal · portal-venous · 0.92mm/px · 2 of 142 slices shown, 3 images]
[im 48/142  soft-tissue]
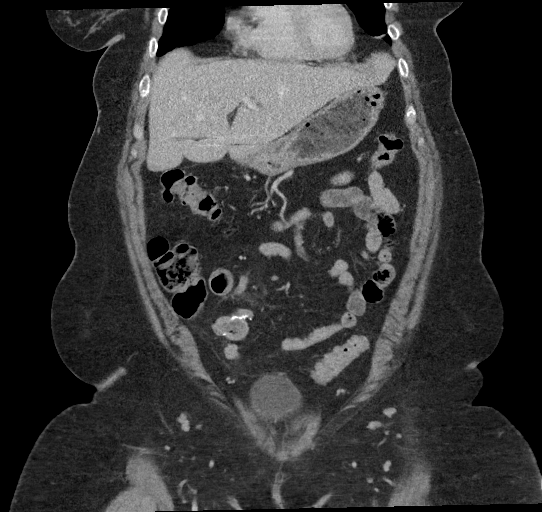
[im 48/142  bone]
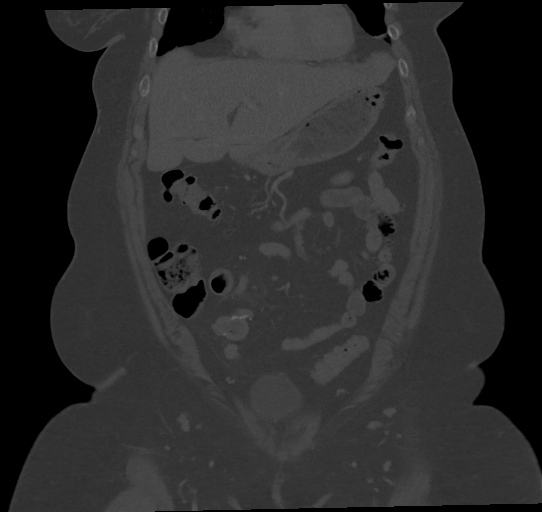
[im 95/142  soft-tissue]
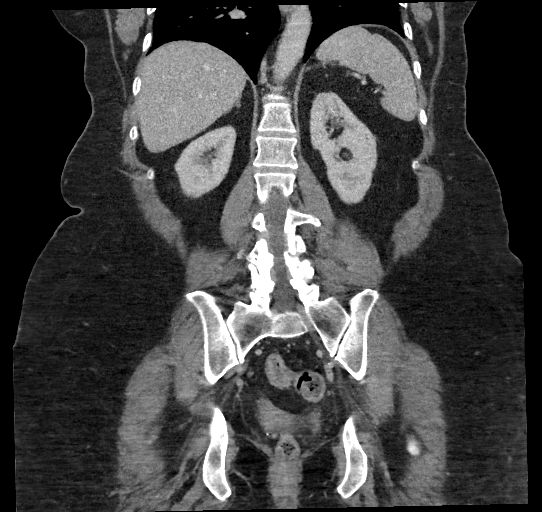

[11 of 46 positions shown; findings below may reference images not displayed]

FINDINGS: VASCULAR

Aorta: Normal caliber aorta without aneurysm, dissection, vasculitis
or significant stenosis. Extensive mixed plaque.

Celiac: Patent without evidence of aneurysm, dissection, vasculitis
or significant stenosis.

SMA: Stent within the SMA origin which appears occluded. The SMA
downstream to the stent normally enhances which may be due to
collateral circulation.

Renals: Both renal arteries are patent without evidence of aneurysm,
dissection, vasculitis, fibromuscular dysplasia or significant
stenosis.

IMA: Patent without evidence of aneurysm, dissection, vasculitis or
significant stenosis.

Inflow: Patent without evidence of aneurysm, dissection, vasculitis
or significant stenosis.

Proximal Outflow: Bilateral common femoral and visualized portions
of the superficial and profunda femoral arteries are patent without
evidence of aneurysm, dissection, vasculitis or significant
stenosis.

Veins: No obvious venous abnormality within the limitations of this
arterial phase study.

Review of the MIP images confirms the above findings.

NON-VASCULAR

Lower chest: No acute abnormality.

Hepatobiliary: No focal liver abnormality is seen. Status post
cholecystectomy. No biliary dilatation.

Pancreas: Unremarkable. No pancreatic ductal dilatation or
surrounding inflammatory changes.

Spleen: Normal in size without focal abnormality.

Adrenals/Urinary Tract: Stable left adrenal hypertrophy. No focal
kidney lesion. No hydronephrosis. No urinary stone disease. Normal
bladder.

Stomach/Bowel: Stomach is within normal limits. Appendix not
identified. Partial small-bowel resection with patent right lower
quadrant anastomosis. No evidence of bowel wall thickening,
distention, or inflammatory changes.

Lymphatic: No enlarged abdominal or pelvic lymph nodes.

Reproductive: Status post hysterectomy. No adnexal masses.

Other: Postsurgical changes within the lower anterior abdominal wall
with diastasis rectus and a broad-based hernia containing loops of
small and large bowel, no associated bowel obstruction or edema.

Musculoskeletal: No fracture is seen.
IMPRESSION: VASCULAR

1. SMA origin stent appears occluded. The SMA downstream to the
stent normally enhances, probably due to collateral circulation.
2. No dissection or aneurysm. Extensive abdominal aortic
atherosclerosis.

NON-VASCULAR

Postsurgical changes in the abdominal wall with a broad-based hernia
and partial small bowel resection with patent anastomosis. No acute
process identified.

By: Mayumi Dombrowski M.D.

## 2019-07-11 ENCOUNTER — Other Ambulatory Visit: Payer: Self-pay

## 2019-07-11 ENCOUNTER — Emergency Department: Payer: Medicare Other

## 2019-07-11 ENCOUNTER — Emergency Department
Admission: EM | Admit: 2019-07-11 | Discharge: 2019-07-11 | Disposition: A | Discharge status: Home / Self Care | Payer: Medicare Other | Attending: Student in an Organized Health Care Education/Training Program | Admitting: Student in an Organized Health Care Education/Training Program

## 2019-07-11 DIAGNOSIS — M5431 Sciatica, right side: Secondary | ICD-10-CM | POA: Diagnosis not present

## 2019-07-11 DIAGNOSIS — F17210 Nicotine dependence, cigarettes, uncomplicated: Secondary | ICD-10-CM | POA: Insufficient documentation

## 2019-07-11 DIAGNOSIS — J449 Chronic obstructive pulmonary disease, unspecified: Secondary | ICD-10-CM | POA: Diagnosis not present

## 2019-07-11 DIAGNOSIS — I509 Heart failure, unspecified: Secondary | ICD-10-CM | POA: Insufficient documentation

## 2019-07-11 DIAGNOSIS — Z79899 Other long term (current) drug therapy: Secondary | ICD-10-CM | POA: Insufficient documentation

## 2019-07-11 DIAGNOSIS — N3000 Acute cystitis without hematuria: Secondary | ICD-10-CM

## 2019-07-11 DIAGNOSIS — M549 Dorsalgia, unspecified: Secondary | ICD-10-CM | POA: Diagnosis present

## 2019-07-11 DIAGNOSIS — Z7901 Long term (current) use of anticoagulants: Secondary | ICD-10-CM | POA: Insufficient documentation

## 2019-07-11 DIAGNOSIS — I110 Hypertensive heart disease with heart failure: Secondary | ICD-10-CM | POA: Diagnosis not present

## 2019-07-11 LAB — URINALYSIS, COMPLETE (UACMP) WITH MICROSCOPIC
Bilirubin Urine: NEGATIVE
Glucose, UA: NEGATIVE mg/dL
Ketones, ur: NEGATIVE mg/dL
Leukocytes,Ua: NEGATIVE
Nitrite: POSITIVE — AB
Protein, ur: NEGATIVE mg/dL
Specific Gravity, Urine: 1.027 (ref 1.005–1.030)
pH: 5 (ref 5.0–8.0)

## 2019-07-11 MED ORDER — HYDROCODONE-ACETAMINOPHEN 5-325 MG PO TABS: 1.0000 | ORAL_TABLET | Freq: Three times a day (TID) | ORAL | 0 refills | Status: DC | PRN

## 2019-07-11 MED ORDER — CEPHALEXIN 500 MG PO CAPS: 500.0000 mg | ORAL_CAPSULE | Freq: Two times a day (BID) | ORAL | 0 refills | Status: AC

## 2019-07-11 MED ORDER — CEPHALEXIN 500 MG PO CAPS: 500.0000 mg | ORAL_CAPSULE | Freq: Once | ORAL | Status: DC

## 2019-07-11 MED ORDER — FLUCONAZOLE 100 MG PO TABS: 150.0000 mg | ORAL_TABLET | Freq: Every day | ORAL | 0 refills | Status: AC

## 2019-07-11 MED ORDER — LIDOCAINE 5 % EX PTCH: 1.0000 | MEDICATED_PATCH | CUTANEOUS | 0 refills | Status: DC

## 2019-07-11 MED ORDER — LIDOCAINE 5 % EX PTCH
1.0000 | MEDICATED_PATCH | CUTANEOUS | Status: DC
  Administered 2019-07-11: 1 via TRANSDERMAL
  Filled 2019-07-11: qty 1

## 2019-07-11 MED ORDER — OXYCODONE-ACETAMINOPHEN 5-325 MG PO TABS
1.0000 | ORAL_TABLET | Freq: Once | ORAL | Status: AC
  Administered 2019-07-11: 1 via ORAL
  Filled 2019-07-11: qty 1

## 2019-07-11 NOTE — ED Notes (Signed)
See triage note  Presents with lower back pain which is radiating into right leg   States of arthritis in lower back  But states this pain is different    Denies any urinary sxs'  Ambulates with slight limp d/t pain

## 2019-07-11 NOTE — ED Triage Notes (Signed)
Pt c/o lower back pain that radiates into the left leg for the past several days. Denies injury. States she has a hx of arthritis in her lower back.

## 2019-07-11 NOTE — ED Notes (Signed)
Patient transported to X-ray 

## 2019-07-11 NOTE — ED Provider Notes (Signed)
[facility] Emergency Department Provider Note  ____________________________________________  Time seen: Approximately 2:03 PM  I have reviewed the triage vital signs and the nursing notes.   HISTORY  Chief Complaint Back Pain    HPI Carla [name] is a 58 y.o. female that presents to the emergency department for evaluation of right low back pain that radiates into right hip for 2 days.  She is walking but with pain.  Patient has had similar pain in the past.  No bowel or bladder dysfunction or saddle anesthesias.  She has taken Tylenol and Motrin for pain. She cannot take muscle relaxers.  No fever, nausea, vomiting, abdominal pain, urinary symptoms, numbness, tingling, weakness.  Past Medical History:  Diagnosis Date  . Abdominal aortic atherosclerosis ([place])    a. 05/2017 CTA abd/pelvis: significant atherosclerotic dzs of the infrarenal abd Ao w/ some mural thrombus. No aneurysm or dissection.  . Abdominal pain   . Acute focal ischemia of small intestine (HCC)   . Acute right-sided low back pain with right-sided sciatica 06/13/2017  . AKI (acute kidney injury) ([place]) 07/29/2017  . Baker's cyst of knee, right    a. 07/2016 U/S: 4.1 x 1.4 x 2.9 cystic structure in R poplitetal fossa.  . Bell's palsy   . Borderline diabetes   . Cancer ([place])    skin cancer on nose  . Chest pain    a. 08/2012 Lexiscan MV: EF 54%, non ischemia/infarct.  . CHF (congestive heart failure) ([place])   . Chronic idiopathic constipation   . Chronic mesenteric ischemia ([place])   . Diastolic dysfunction    a. 05/2017 Echo: Ef 60-65%, no rwma, Gr1 DD, no source of cardiac emboli.  . 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 05/2017 CTA Abd/pelvis: apparent thrombus or embolus in prox SMA (70-90%); b. 05/2017 catheter directed tPA, mechanical thrombectomy, and stenting of the SMA.  . Essential hypertension with goal blood pressure less than 140/90 01/19/2016  . Gastroesophageal reflux  disease 01/19/2016  . GERD (gastroesophageal reflux disease)   . History of kidney stones   . Hyperlipidemia   . Hypertension   . Hypotension 07/29/2017  . Morbid obesity with BMI of 40.0-44.9, adult ([place])   . Occlusive mesenteric ischemia ([place]) 06/19/2017  . Personal history of other malignant neoplasm of skin 06/01/2016  . Primary osteoarthritis of both knees 06/13/2017  . Recurrent major depressive disorder ([place]) 01/19/2016  . SBO (small bowel obstruction) ([place]) 08/06/2017  . 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 Urinary tract infection 01/09/2014    Patient Active Problem List   Diagnosis Date Noted  . Pharmacologic therapy 03/26/2019  . Disorder of skeletal system 03/26/2019  . Problems influencing health status 03/26/2019  . Chronic low back pain (Secondary area of Pain) (Bilateral) w/o sciatica 03/26/2019  . Rib pain on left side 03/25/2019  . Aortic atherosclerosis ([place]) 09/12/2018  . COPD (chronic obstructive pulmonary disease) ([place]) 09/12/2018  . Moderate episode of recurrent major depressive disorder ([place]) 09/12/2018  . Lumbar spondylosis 09/06/2018  . History of total knee replacement (Right) 07/11/2018  . Ventral hernia with obstruction and without gangrene 07/02/2018  . Paroxysmal atrial fibrillation ([place]) 06/15/2018  . Accelerated hypertension 04/08/2018  . Acute encephalopathy 04/08/2018  . DDD (degenerative disc disease), lumbosacral 09/25/2017  . Osteoarthritis of knee 09/25/2017  . Adjustment disorder with anxiety 08/15/2017  . Chronic idiopathic constipation   . Protein-calorie malnutrition, severe 08/08/2017  . SBO (small bowel obstruction) ([place]) 08/06/2017  .  Chronic mesenteric ischemia ([place])   . AKI (acute kidney injury) ([place]) 07/29/2017  . Hypotension 07/29/2017  . 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 Abdominal pain   . Occlusive mesenteric ischemia ([place]) 06/19/2017  . 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 Acute focal  ischemia of small intestine (HCC)   . Acute right-sided low back pain with right-sided sciatica 06/13/2017  . Osteoarthritis of knee (Bilateral) 06/13/2017  . Chronic pain syndrome 09/13/2016  . Morbid obesity with BMI of 40.0-44.9, adult ([place]) 07/16/2016  . Personal history of other malignant neoplasm of skin 06/01/2016  . Chronic knee pain (Primary Area of Pain) (Bilateral) 01/19/2016  . HTN (hypertension) 01/19/2016  . Gastroesophageal reflux disease 01/19/2016  . Hyperlipidemia 01/19/2016  . Prediabetes 01/19/2016  . Recurrent major depressive disorder ([place]) 01/19/2016  . Urinary tract infection 01/09/2014  . Chronic pelvic pain in female 11/14/2013  . Mixed stress and urge urinary incontinence 11/14/2013  . Chest pain 08/06/2012    Past Surgical History:  Procedure Laterality Date  . APPENDECTOMY    . CHOLECYSTECTOMY    . LAPAROTOMY N/A 08/08/2017   Procedure: EXPLORATORY LAPAROTOMY POSSIBLE BOWEL RESECTION;  Surgeon: Jules Husbands, MD;  Location: [facility] ORS;  Service: General;  Laterality: N/A;  . TEE WITHOUT CARDIOVERSION N/A 06/22/2017   Procedure: TRANSESOPHAGEAL ECHOCARDIOGRAM (TEE);  Surgeon: Wellington Hampshire, MD;  Location: [facility] ORS;  Service: Cardiovascular;  Laterality: N/A;  . TOTAL KNEE ARTHROPLASTY Right 07/11/2018   Procedure: TOTAL KNEE ARTHROPLASTY;  Surgeon: Corky Mull, MD;  Location: [facility] ORS;  Service: Orthopedics;  Laterality: Right;  [name] VAGINAL HYSTERECTOMY    . VISCERAL ARTERY INTERVENTION N/A 06/20/2017   Procedure: Visceral Artery Intervention, possible aortic thrombectomy;  Surgeon: Algernon Huxley, MD;  Location: [facility];  Service: Cardiovascular;  Laterality: N/A;  . VISCERAL ARTERY INTERVENTION N/A 01/28/2018   Procedure: VISCERAL ARTERY INTERVENTION;  Surgeon: Algernon Huxley, MD;  Location: [facility];  Service: Cardiovascular;  Laterality: N/A;    Prior to Admission medications   Medication Sig Start Date End Date Taking?  Authorizing Provider  amLODipine (NORVASC) 5 MG tablet Take 1 tablet (5 mg total) by mouth daily. 04/10/18   Gladstone Lighter, MD  apixaban (ELIQUIS) 5 MG TABS tablet Take 5 mg by mouth 2 (two) times daily.    [provider]  atorvastatin (LIPITOR) 80 MG tablet Take 80 mg by mouth at bedtime.     [provider]  cephALEXin (KEFLEX) 500 MG capsule Take 1 capsule (500 mg total) by mouth 2 (two) times daily for 10 days. 07/11/19 07/21/19  Laban Emperor, PA-C  clotrimazole-betamethasone (LOTRISONE) cream Apply 1 application topically 2 (two) times daily as needed (for dry skin).     [provider]  diphenhydrAMINE (BENADRYL) 25 MG tablet Take 1 tablet (25 mg total) by mouth every 8 (eight) hours as needed for itching or allergies. 02/04/19   Hillary Bow, MD  DULoxetine (CYMBALTA) 60 MG capsule Take 60 mg by mouth daily.    [provider]  ferrous sulfate 325 (65 FE) MG tablet Take 325 mg by mouth daily.    [provider]  fluconazole (DIFLUCAN) 100 MG tablet Take 1.5 tablets (150 mg total) by mouth daily. 07/11/19 08/10/19  Laban Emperor, PA-C  HYDROcodone-acetaminophen (NORCO) 5-325 MG tablet Take 1 tablet by mouth every 8 (eight) hours as needed for moderate pain. 07/11/19   Laban Emperor, PA-C  lidocaine (LIDODERM) 5 %  Place 1 patch onto the skin daily. Remove & Discard patch within 12 hours or as directed by MD 07/11/19   Laban Emperor, PA-C  metoprolol tartrate (LOPRESSOR) 50 MG tablet Take 1 tablet (50 mg total) by mouth 2 (two) times daily. 06/15/18   Bettey Costa, MD  omeprazole (PRILOSEC) 20 MG capsule Take 20 mg by mouth 2 (two) times daily before a meal.    [provider]  ondansetron (ZOFRAN) 4 MG tablet Take 1 tablet (4 mg total) by mouth every 8 (eight) hours as needed. Patient taking differently: Take 4 mg by mouth every 8 (eight) hours as needed for nausea or vomiting.  11/10/18   Nance Pear, MD    Allergies Bactrim  [sulfamethoxazole-trimethoprim]  Family History  Problem Relation Age of Onset  . Hypertension Mother   . Heart disease Mother   . Heart attack Mother   . Breast cancer Mother   . Hypertension Father   . Breast cancer Paternal Aunt     Social History Social History   Tobacco Use  . Smoking status: Current Every Day Smoker    Packs/day: 0.50    Types: Cigarettes  . Smokeless tobacco: Never Used  Substance Use Topics  . Alcohol use: No  . Drug use: No     Review of Systems  Constitutional: No fever/chills Cardiovascular: No chest pain. Respiratory: No SOB. Gastrointestinal: No abdominal pain.  No nausea, no vomiting.  Genitourinary: Negative for dysuria. Musculoskeletal: Positive for low back pain. Skin: Negative for rash, abrasions, lacerations, ecchymosis. Neurological: Negative for numbness or tingling   ____________________________________________   PHYSICAL EXAM:  VITAL SIGNS: ED Triage Vitals  Enc Vitals Group     BP 07/11/19 1240 (!) 157/96     Pulse Rate 07/11/19 1240 71     Resp 07/11/19 1240 18     Temp 07/11/19 1240 98.3 F (36.8 C)     Temp Source 07/11/19 1240 Oral     SpO2 07/11/19 1240 98 %     Weight 07/11/19 1241 300 lb (136.1 kg)     Height 07/11/19 1241 5\' 2"  (1.575 m)     Head Circumference --      Peak Flow --      Pain Score 07/11/19 1241 10     Pain Loc --      Pain Edu? --      Excl. in [place]? --      Constitutional: Alert and oriented. Well appearing and in no acute distress. Eyes: Conjunctivae are normal. PERRL. EOMI. Head: Atraumatic. ENT:      Ears:      Nose: No congestion/rhinnorhea.      Mouth/Throat: Mucous membranes are moist.  Neck: No stridor.   Cardiovascular: Normal rate, regular rhythm.  Good peripheral circulation. Respiratory: Normal respiratory effort without tachypnea or retractions. Lungs CTAB. Good air entry to the bases with no decreased or absent breath sounds. Gastrointestinal: Bowel sounds 4  quadrants. Soft and nontender to palpation. No guarding or rigidity. No palpable masses. No distention.  Musculoskeletal: Full range of motion to all extremities. No gross deformities appreciated.  Tenderness to palpation to right SI joint.  Strength equal in lower extremities bilaterally.  Positive straight leg raise.  No foot drop.  Antalgic gait. Neurologic:  Normal speech and language. No gross focal neurologic deficits are appreciated.  Skin:  Skin is warm, dry and intact. No rash noted. Psychiatric: Mood and affect are normal. Speech and behavior are normal. Patient exhibits appropriate insight  and judgement.   ____________________________________________   LABS (all labs ordered are listed, but only abnormal results are displayed)  Labs Reviewed  URINALYSIS, COMPLETE (UACMP) WITH MICROSCOPIC - Abnormal; Notable for the following components:      Result Value   Color, Urine AMBER (*)    APPearance CLOUDY (*)    Hgb urine dipstick MODERATE (*)    Nitrite POSITIVE (*)    Bacteria, UA MANY (*)    All other components within normal limits   ____________________________________________  EKG   ____________________________________________  RADIOLOGY Robinette Haines, personally viewed and evaluated these images (plain radiographs) as part of my medical decision making, as well as reviewing the written report by the radiologist.  Dg Lumbar Spine Complete  Result Date: 07/11/2019 CLINICAL DATA:  Low back pain with left-sided radicular symptoms EXAM: LUMBAR SPINE - COMPLETE 4+ VIEW COMPARISON:  None. FINDINGS: Frontal, lateral, spot lumbosacral lateral, and bilateral oblique views were obtained. There are 5 non-rib-bearing lumbar type vertebral bodies. There is mild mid lumbar dextroscoliosis. There is no fracture or spondylolisthesis. There is fairly mild disc space narrowing at L4-5 and L5-S1. Other disc spaces appear unremarkable. There is slight facet osteoarthritic change at L5-S1  bilaterally. There is aortic atherosclerosis. IMPRESSION: Slight scoliosis. 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 No fracture or spondylolisthesis. Aortic Atherosclerosis (ICD10-I70.0). Electronically Signed   By: Lowella Grip III M.D.   On: 07/11/2019 14:55    ____________________________________________    PROCEDURES  Procedure(s) performed:    Procedures    Medications  lidocaine (LIDODERM) 5 % 1 patch (1 patch Transdermal Patch Applied 07/11/19 1422)  cephALEXin (KEFLEX) capsule 500 mg (has no administration in time range)  oxyCODONE-acetaminophen (PERCOCET/ROXICET) 5-325 MG per tablet 1 tablet (1 tablet Oral Given 07/11/19 1423)     ____________________________________________   INITIAL IMPRESSION / ASSESSMENT AND PLAN / ED COURSE  Pertinent labs & imaging results that were available during my care of the patient were reviewed by me and considered in my medical decision making (see chart for details).  Review of the [place] CSRS was performed in accordance of the [place] prior to dispensing any controlled drugs.   Patient's diagnosis is consistent with sciatica and urinary tract infection.  Vital signs and exam are reassuring.  X-ray consistent with chronic changes.  Findings were discussed with patient.  Urinalysis consistent with infection.  Pain significantly improved with Percocet.  Patient will be discharged home with prescriptions for Keflex, a short course of Percocet and Diflucan prophylactically for yeast infection. Patient is to follow up with primary care and orthopedics as directed.  Referral was given.  Patient is given ED precautions to return to the ED for any worsening or new symptoms.   Carla [name] was evaluated in Emergency Department on 07/11/2019 for the symptoms described in the history of present illness. She was evaluated in the context of the global COVID-19 pandemic, which necessitated consideration that the patient might be at risk for infection  with the SARS-CoV-2 virus that causes COVID-19. Institutional protocols and algorithms that pertain to the evaluation of patients at risk for COVID-19 are in a state of rapid change based on information released by regulatory bodies including the CDC and federal and state organizations. These policies and algorithms were followed during the patient's care in the ED.  ____________________________________________  FINAL CLINICAL IMPRESSION(S) / ED DIAGNOSES  Final diagnoses:  Sciatica of right side  Acute cystitis without hematuria      NEW MEDICATIONS STARTED DURING  THIS VISIT:  ED Discharge Orders         Ordered    lidocaine (LIDODERM) 5 %  Every 24 hours     07/11/19 1609    cephALEXin (KEFLEX) 500 MG capsule  2 times daily     07/11/19 1609    HYDROcodone-acetaminophen (NORCO) 5-325 MG tablet  Every 8 hours PRN     07/11/19 1609    fluconazole (DIFLUCAN) 100 MG tablet  Daily     07/11/19 1621              This chart was dictated using voice recognition software/Dragon. Despite best efforts to proofread, errors can occur which can change the meaning. Any change was purely unintentional.    Laban Emperor, PA-C 07/11/19 Jerolyn Shin, MD 07/14/19 1005

## 2019-07-16 IMAGING — CR DG ABDOMEN 1V
1 series · 1 of 1 positions shown · non-contrast
Comparison: 01/26/2018

CLINICAL DATA: Pt reports stomach pain and inability to use the
bathroom. Pt says last bowel movement was this morning where she
produced "very little" and it was soft.

EXAM:
ABDOMEN - 1 VIEW

[dg abd 1 view]
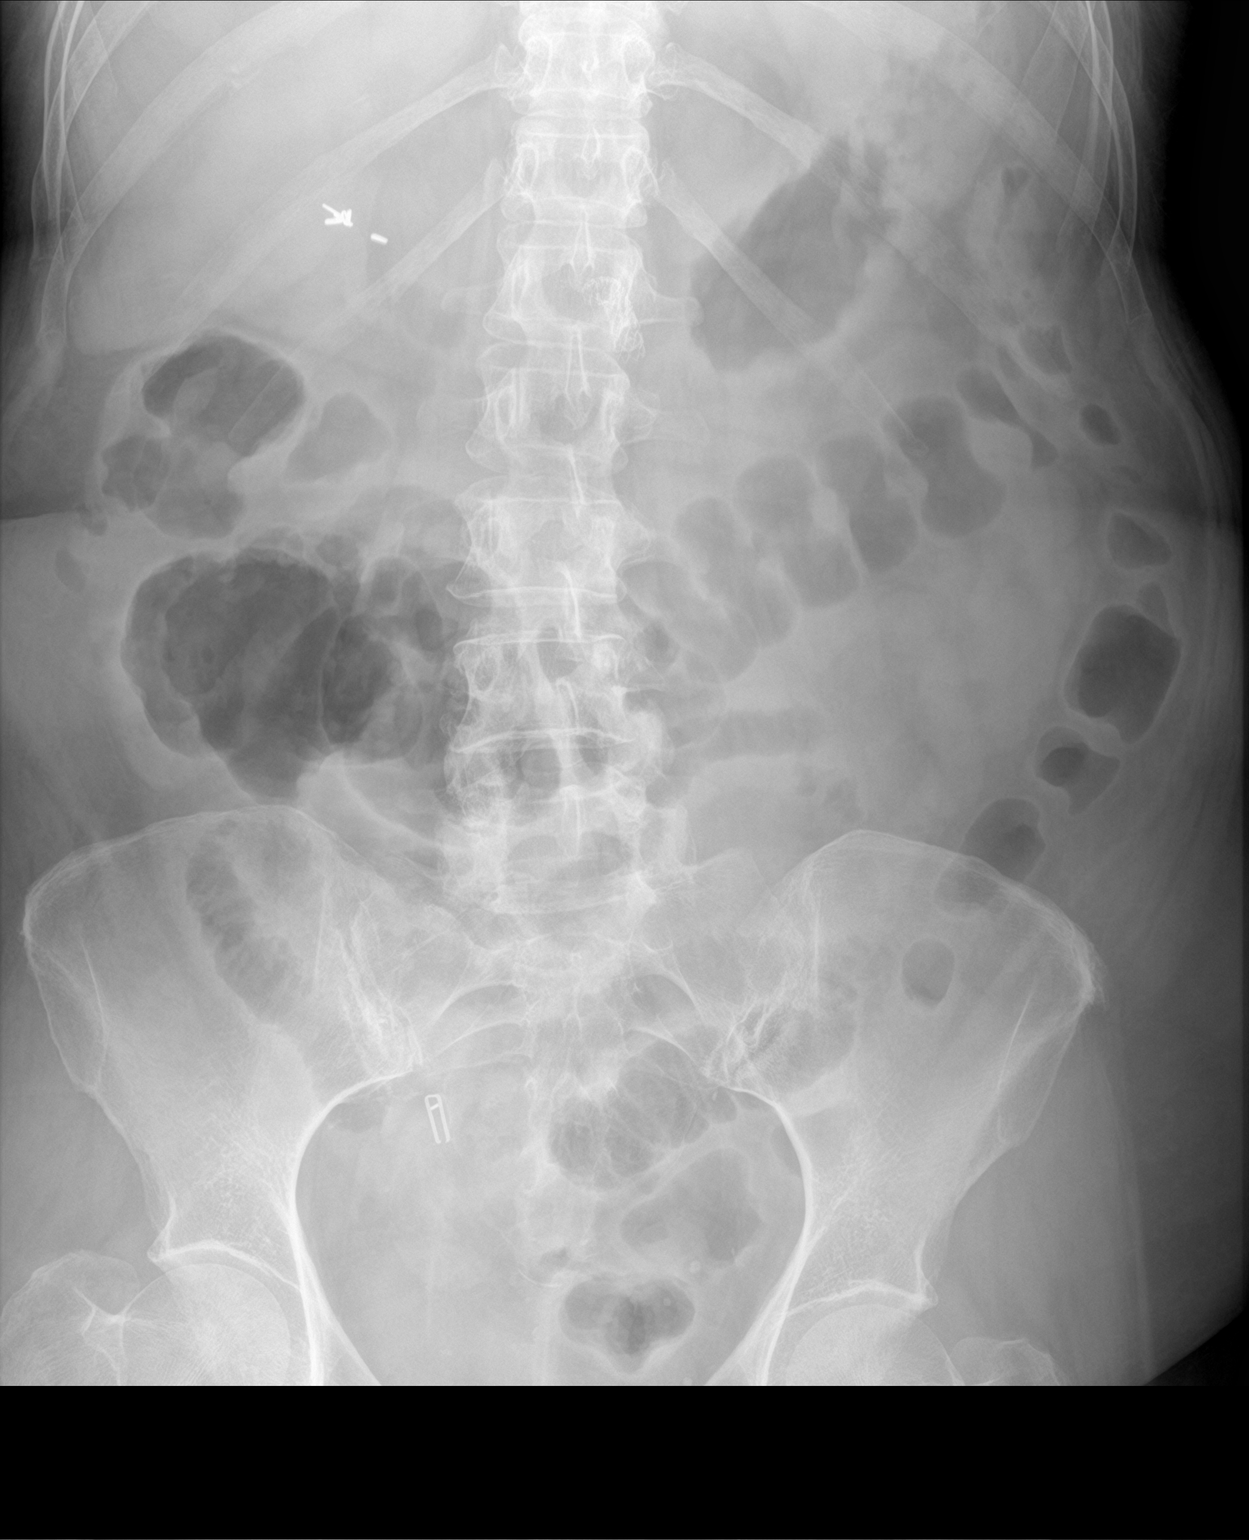

[1 of 1 positions shown; findings below may reference images not displayed]

FINDINGS: Normal bowel gas pattern. There has been a decrease in the colonic
stool burden since the prior study. No significant increase colonic
stool

Surgical clips are upper quadrant reflect prior cholecystectomy.
Single metal clip noted in the right pelvis, both findings stable.

Soft tissues are otherwise unremarkable.

No acute skeletal abnormality.
IMPRESSION: 1. No acute findings.  No bowel obstruction.
2. Significant decrease stool when compared to the prior study. No
other change.

## 2019-09-21 IMAGING — CT CT HEAD W/O CM
3 series · 16 of 47 positions shown, 19 images · non-contrast
Comparison: Head CT 07/23/2016

CLINICAL DATA: Altered level consciousness

EXAM:
CT HEAD WITHOUT CONTRAST
TECHNIQUE: Contiguous axial images were obtained from the base of the skull
through the vertex without intravenous contrast.

[Series 3: head wo · axial · 0.39mm/px · z∈[+673,+798]mm · 10 of 30 slices shown, 13 images]
[im 3/30  brain]
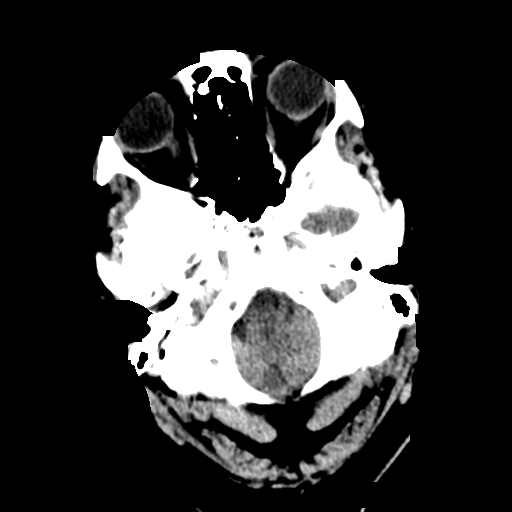
[im 3/30  bone]
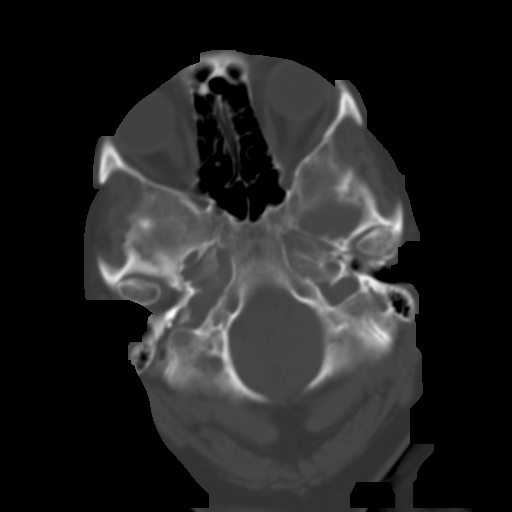
[im 6/30  brain]
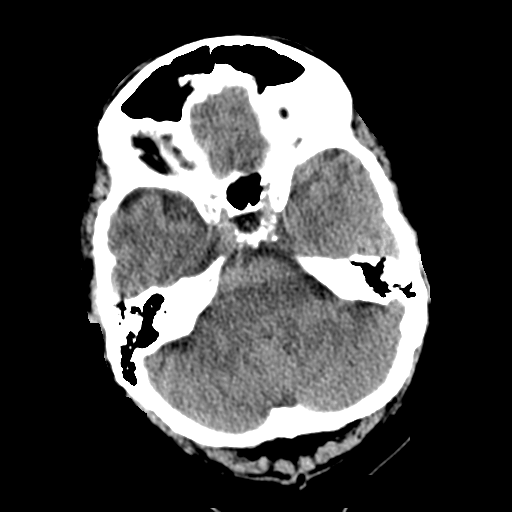
[im 9/30  brain]
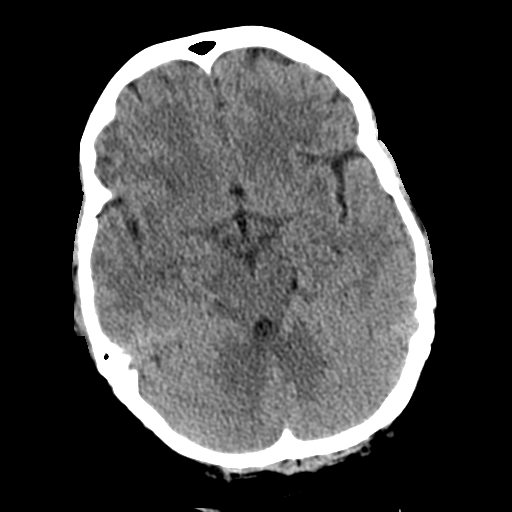
[im 11/30  brain]
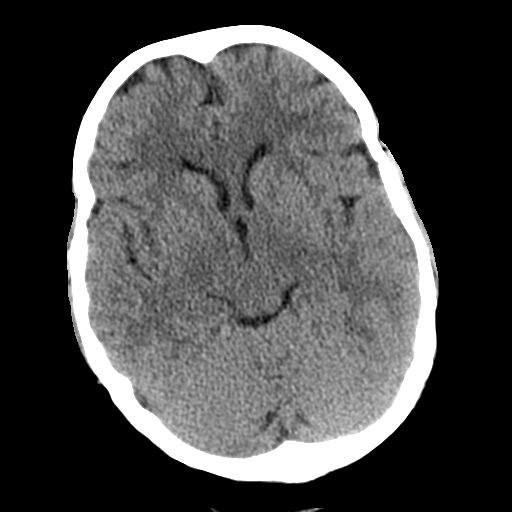
[im 14/30  brain]
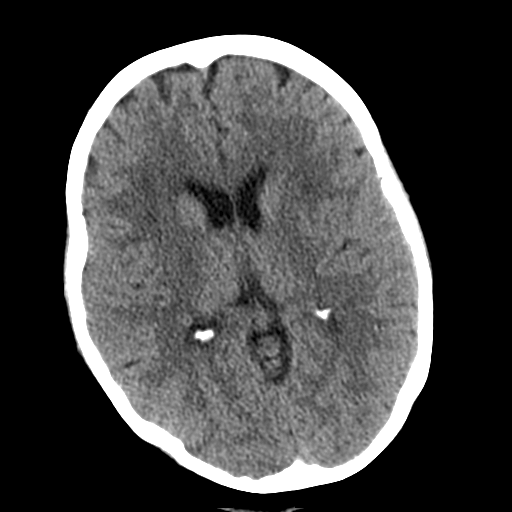
[im 14/30  bone]
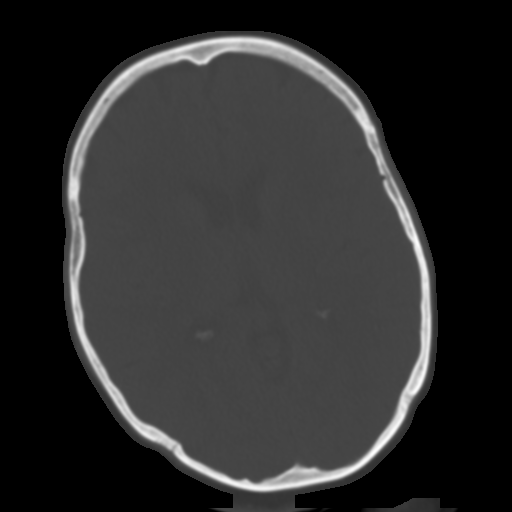
[im 17/30  brain]
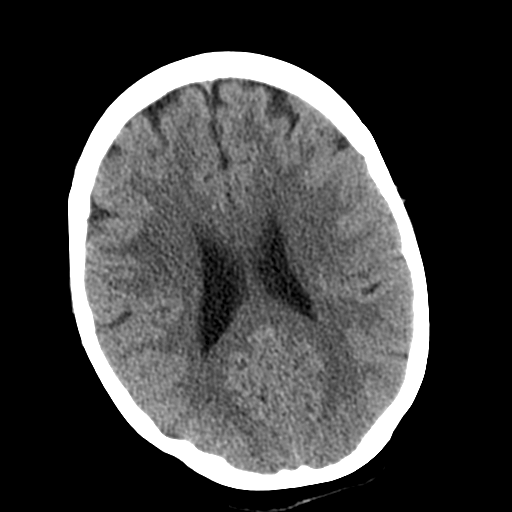
[im 20/30  brain]
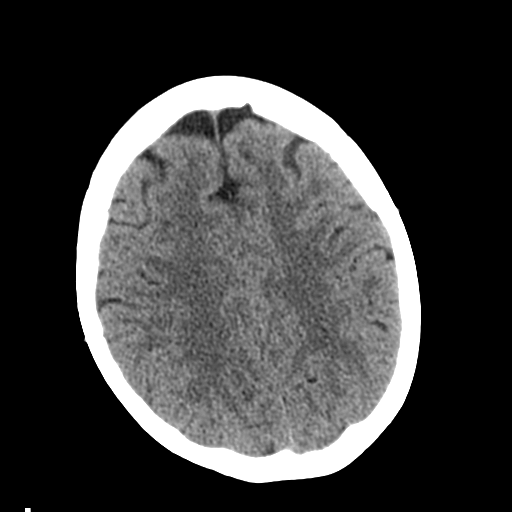
[im 23/30  brain]
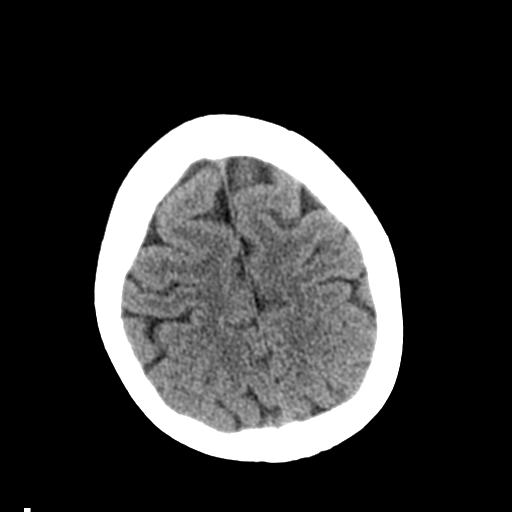
[im 25/30  brain]
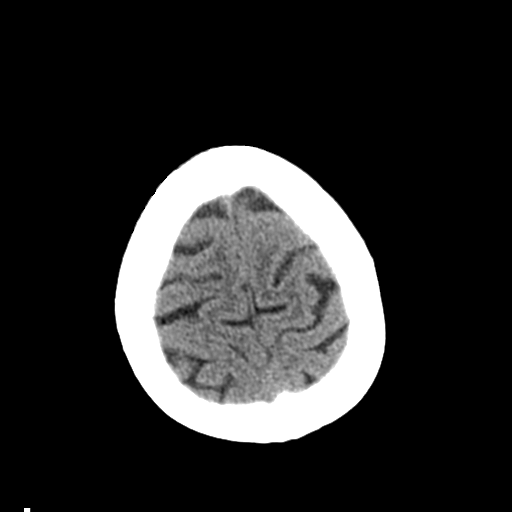
[im 25/30  bone]
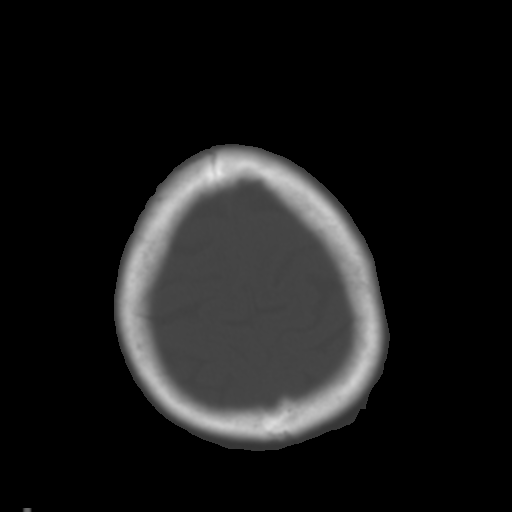
[im 28/30  brain]
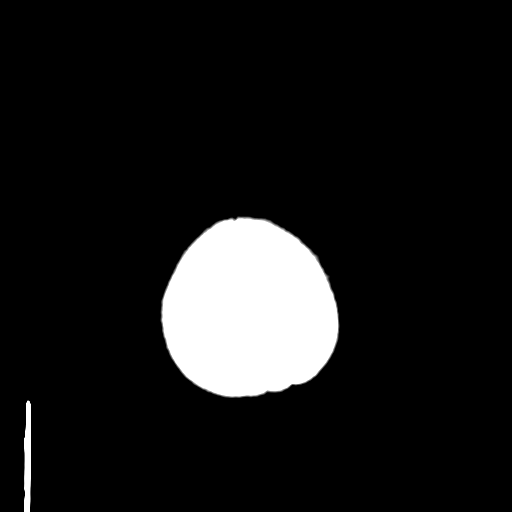

[Series 4: coronal soft tissue · coronal · 0.33mm/px · 3 of 70 slices shown]
[im 24/70  brain]
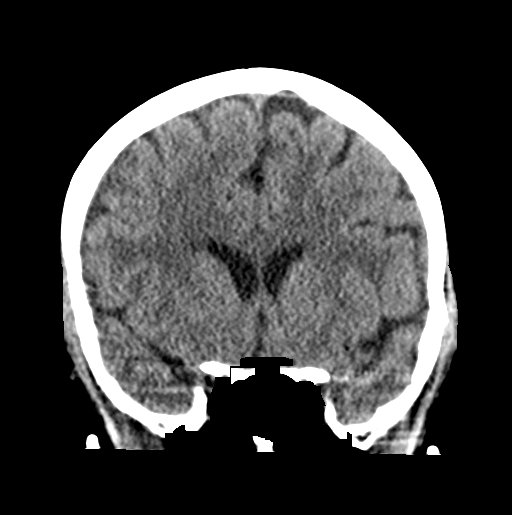
[im 31/70  brain]
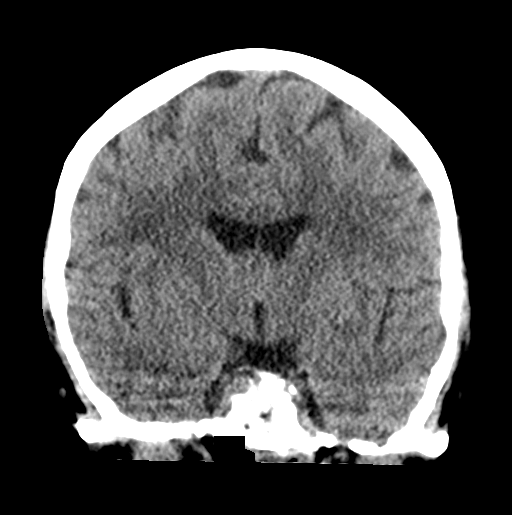
[im 39/70  brain]
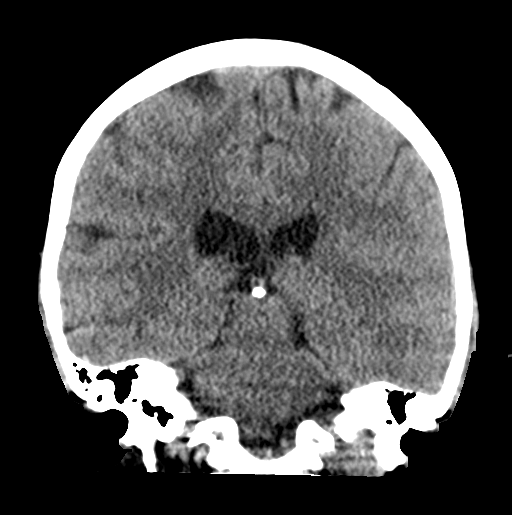

[Series 5: sagittal soft tissue · sagittal · 0.33mm/px · 3 of 53 slices shown]
[im 18/53  brain]
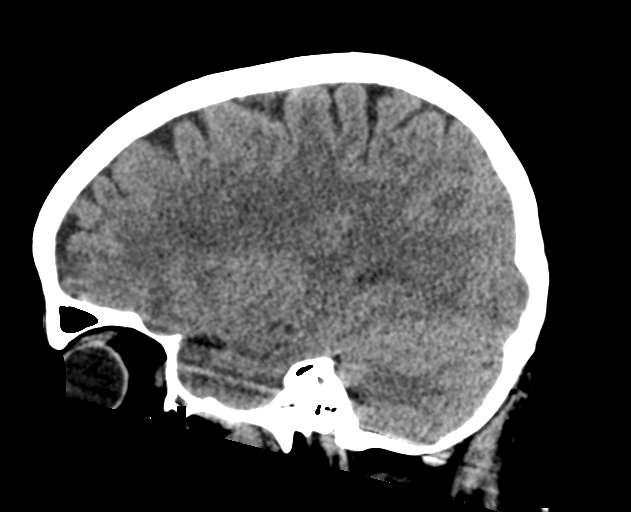
[im 27/53  brain]
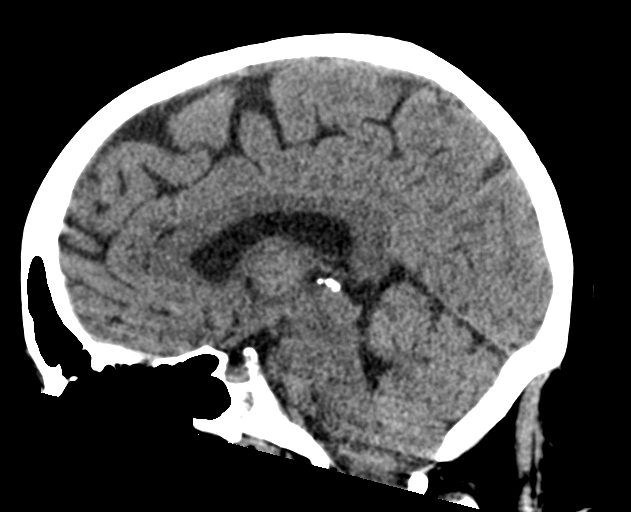
[im 35/53  brain]
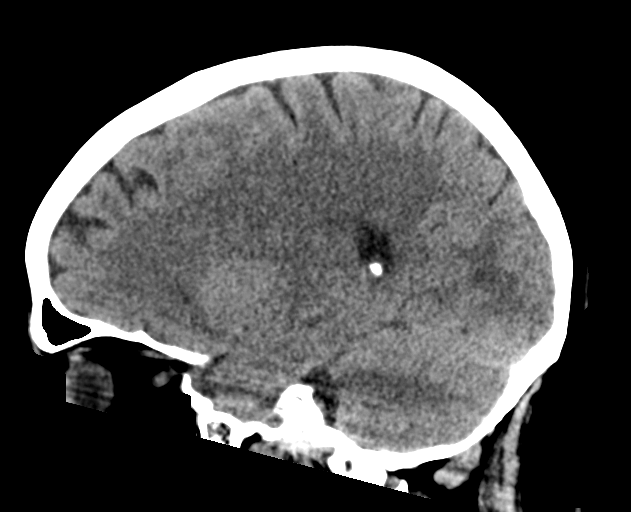

[16 of 47 positions shown; findings below may reference images not displayed]

FINDINGS: Brain: No acute intracranial hemorrhage. No focal mass lesion. No CT
evidence of acute infarction. No midline shift or mass effect. No
hydrocephalus. Basilar cisterns are patent.

Vascular: No hyperdense vessel or unexpected calcification.

Skull: Normal. Negative for fracture or focal lesion.

Sinuses/Orbits: Paranasal sinuses and mastoid air cells are clear.
Orbits are clear.

Other: None.
IMPRESSION: No acute intracranial findings.

## 2019-09-21 IMAGING — CT CT ANGIO CHEST-ABD-PELV FOR DISSECTION W/ AND WO/W CM
2 of 7 series · 13 of 46 positions shown, 15 images · IV contrast (iopamidol)
Comparison: CT abdomen and pelvis 01/24/2018. CTA chest abdomen and
pelvis 06/19/2017

CLINICAL DATA: Chest pain and dizziness. Lethargy. Hypertension.
Extensive vascular history.

EXAM:
CT ANGIOGRAPHY CHEST, ABDOMEN AND PELVIS
TECHNIQUE: Multidetector CT imaging through the chest, abdomen and pelvis was
performed using the standard protocol during bolus administration of
intravenous contrast. Multiplanar reconstructed images and MIPs were
obtained and reviewed to evaluate the vascular anatomy.
CONTRAST:  100mL MTRLB2-DQA IOPAMIDOL (MTRLB2-DQA) INJECTION 76%

[Series 6: axial arterial · axial · arterial · 0.69mm/px · z∈[-740,-236]mm · 10 of 206 slices shown, 12 images]
[im 19/206  soft-tissue]
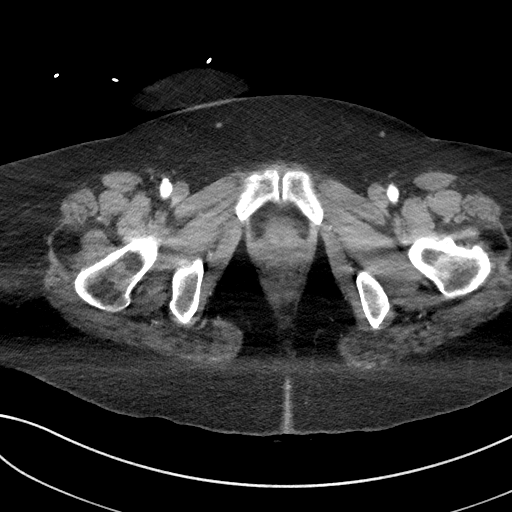
[im 19/206  bone]
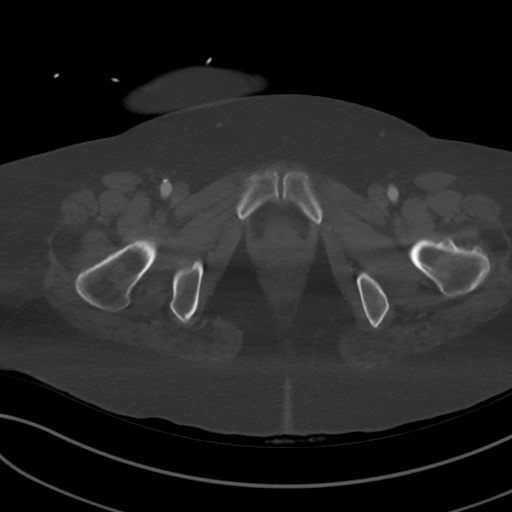
[im 38/206  soft-tissue]
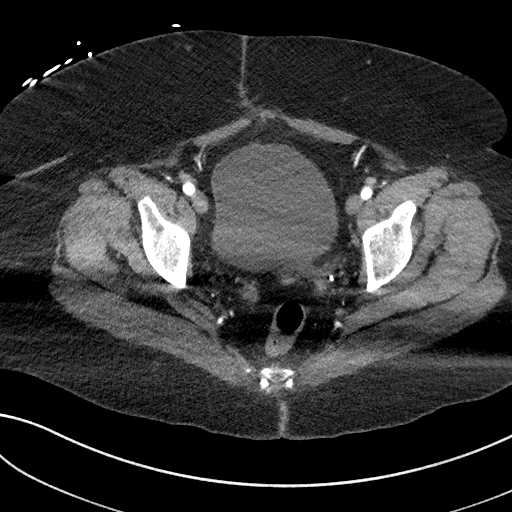
[im 56/206  soft-tissue]
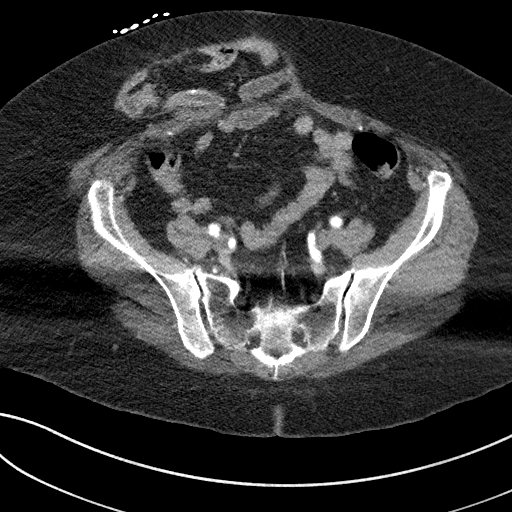
[im 75/206  soft-tissue]
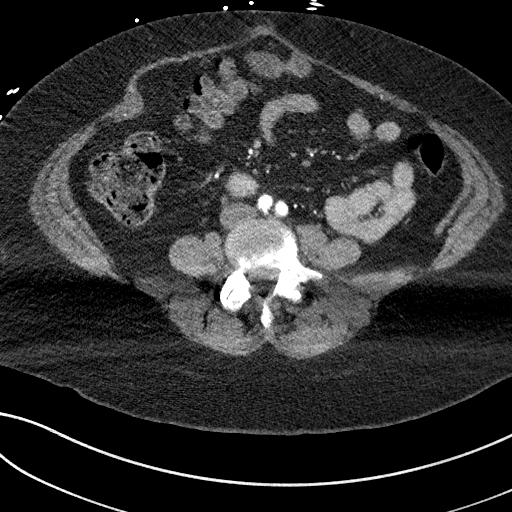
[im 94/206  soft-tissue]
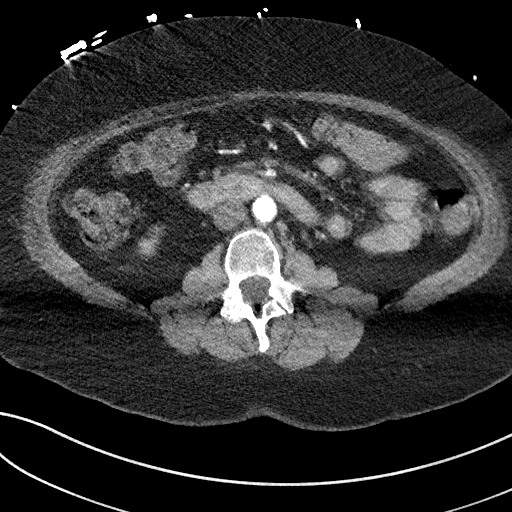
[im 112/206  soft-tissue]
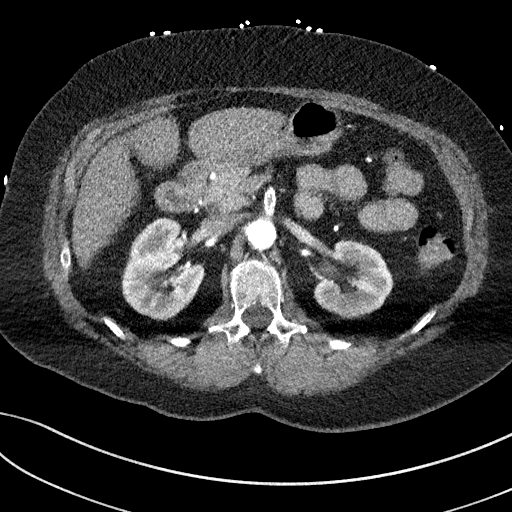
[im 131/206  soft-tissue]
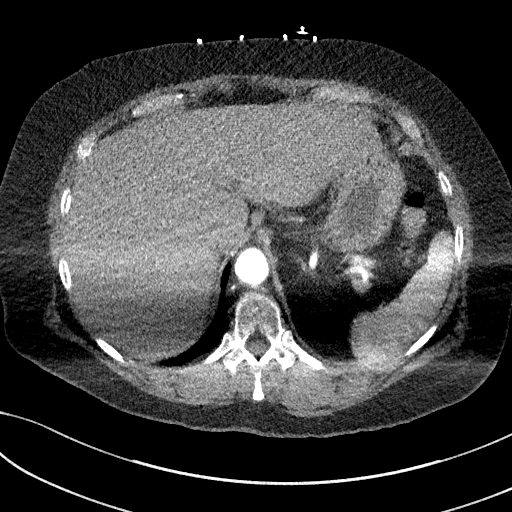
[im 150/206  soft-tissue]
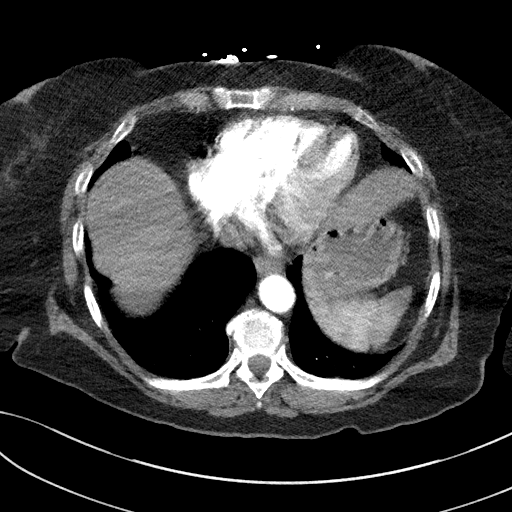
[im 168/206  soft-tissue]
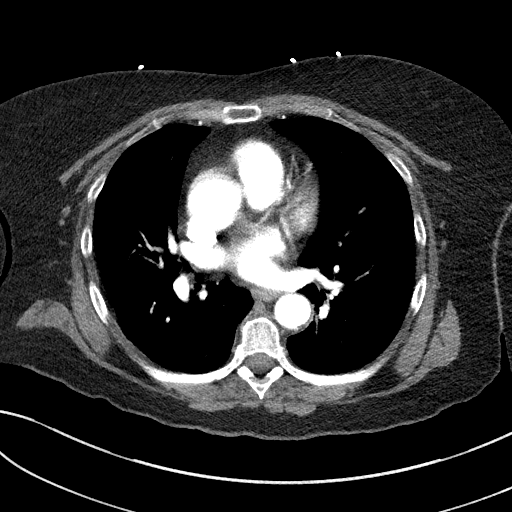
[im 168/206  bone]
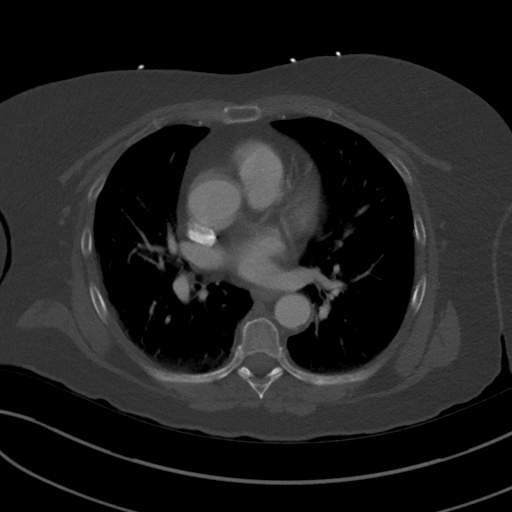
[im 187/206  soft-tissue]
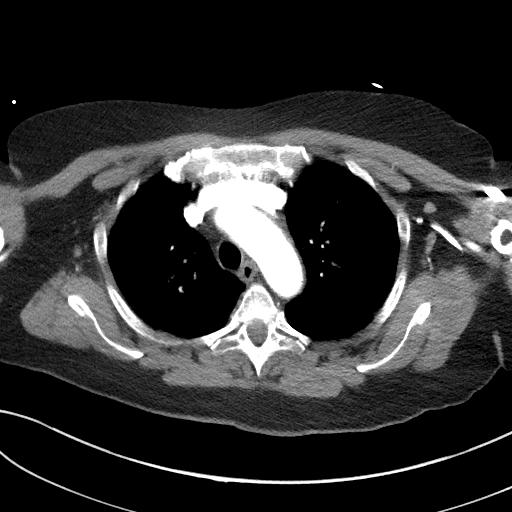

[Series 9: coronals · coronal · 0.83mm/px · 3 of 147 slices shown]
[im 37/147  soft-tissue]
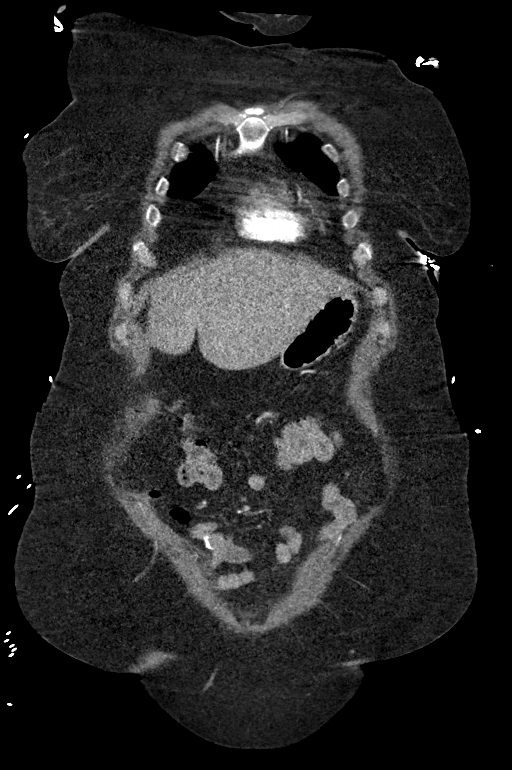
[im 74/147  soft-tissue]
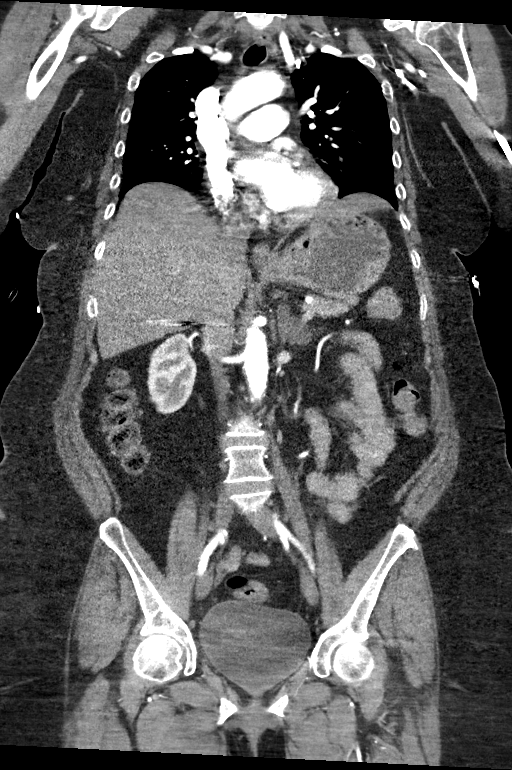
[im 110/147  soft-tissue]
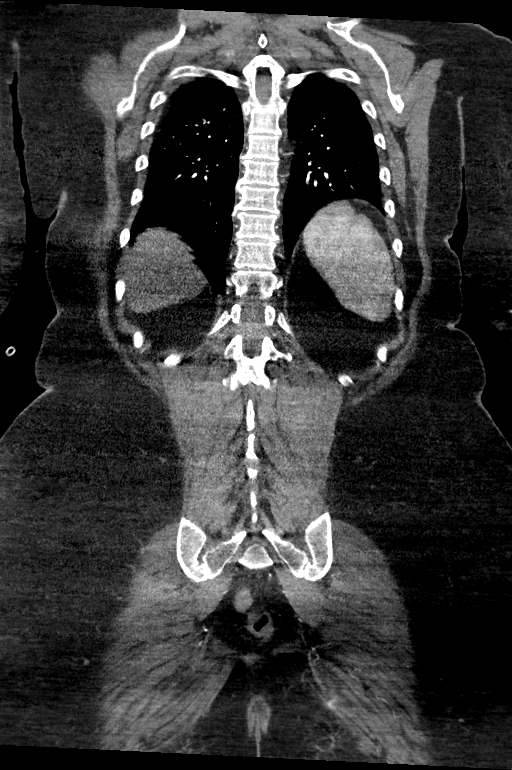

[13 of 46 positions shown; findings below may reference images not displayed]

FINDINGS: CTA CHEST FINDINGS

Cardiovascular: Noncontrast CT images of the chest demonstrate
scattered calcifications in the thoracic aorta and coronary
arteries. No evidence of intramural hematoma in the aorta. Calcified
granuloma along the crus of the diaphragm at the hiatus.

Images obtained during arterial phase after intravenous injection of
contrast material demonstrates good opacification of the thoracic
aorta. No aortic aneurysm or dissection. Great vessel origins are
patent. Normal heart size. No pericardial effusion. Central
pulmonary arteries are well opacified without evidence of pulmonary
embolus.

Mediastinum/Nodes: Esophagus is decompressed. No significant
lymphadenopathy in the chest. Thyroid gland is unremarkable.
Axillary lymph nodes are not pathologically enlarged.

Lungs/Pleura: Atelectasis in the lung bases. No evidence of
consolidation or edema in the lungs. No pleural effusions. No
pneumothorax. Airways are patent.

Musculoskeletal: No destructive bone lesions or acute fractures
identified. Degenerative changes in the spine.

Review of the MIP images confirms the above findings.

CTA ABDOMEN AND PELVIS FINDINGS

VASCULAR

Aorta: Normal caliber abdominal aorta. No aortic dissection. Aortic
calcifications are present.

Celiac: Celiac axis is patent. No evidence of dissection or
aneurysm. No significant stenosis.

SMA: There is a stent in the origin of the superior mesenteric
artery. The stent is thrombosed with proximal refilling of the SMA
after the thrombosed segment. SMA thrombosis is unchanged since the
previous study.

Renals: Single right and duplicated left renal arteries are patent.
Renal nephrograms are symmetrical.

IMA: Inferior mesenteric artery is patent.

Inflow: Calcification at the origin of the right common iliac artery
but remains patent. No aneurysm or dissection.

Veins: No obvious venous abnormality within the limitations of this
arterial phase study.

Review of the MIP images confirms the above findings.

NON-VASCULAR

Hepatobiliary: Surgical absence of the gallbladder. No focal liver
lesion.

Pancreas: Unremarkable. No pancreatic ductal dilatation or
surrounding inflammatory changes.

Spleen: Normal in size without focal abnormality.

Adrenals/Urinary Tract: Adrenal glands are unremarkable. Kidneys are
normal, without renal calculi, focal lesion, or hydronephrosis.
Bladder is unremarkable.

Stomach/Bowel: There is a large ventral abdominal wall hernia
containing multiple small bowel loops. No proximal obstruction.
Stomach, small bowel, and colon are not abnormally distended. No
significant wall thickening. Colon is diffusely stool-filled without
distention. Appendix is not identified.

Lymphatic: No significant lymphadenopathy.

Reproductive: Uterus is surgically absent. No abnormal adnexal
masses.

Other: No free air or free fluid in the abdomen or pelvis.

Musculoskeletal: No destructive bone lesions.

Review of the MIP images confirms the above findings.
IMPRESSION: 1. No evidence of dissection or aneurysm involving the thoracic or
abdominal aorta.
2. Aortic atherosclerosis.
3. Superior mesenteric artery stent is occluded with patency of the
remainder of the superior mesenteric artery. This appearance is
unchanged since previous studies.
4. Large ventral abdominal wall hernia containing multiple loops of
small bowel but without proximal obstruction.

## 2019-09-21 IMAGING — CR DG CHEST 1V
1 series · 1 of 1 positions shown · non-contrast
Comparison: 08/15/2017

CLINICAL DATA: Chest pain and dizziness.  Lethargic and somnolent.

EXAM:
CHEST  1 VIEW

[chest ap]
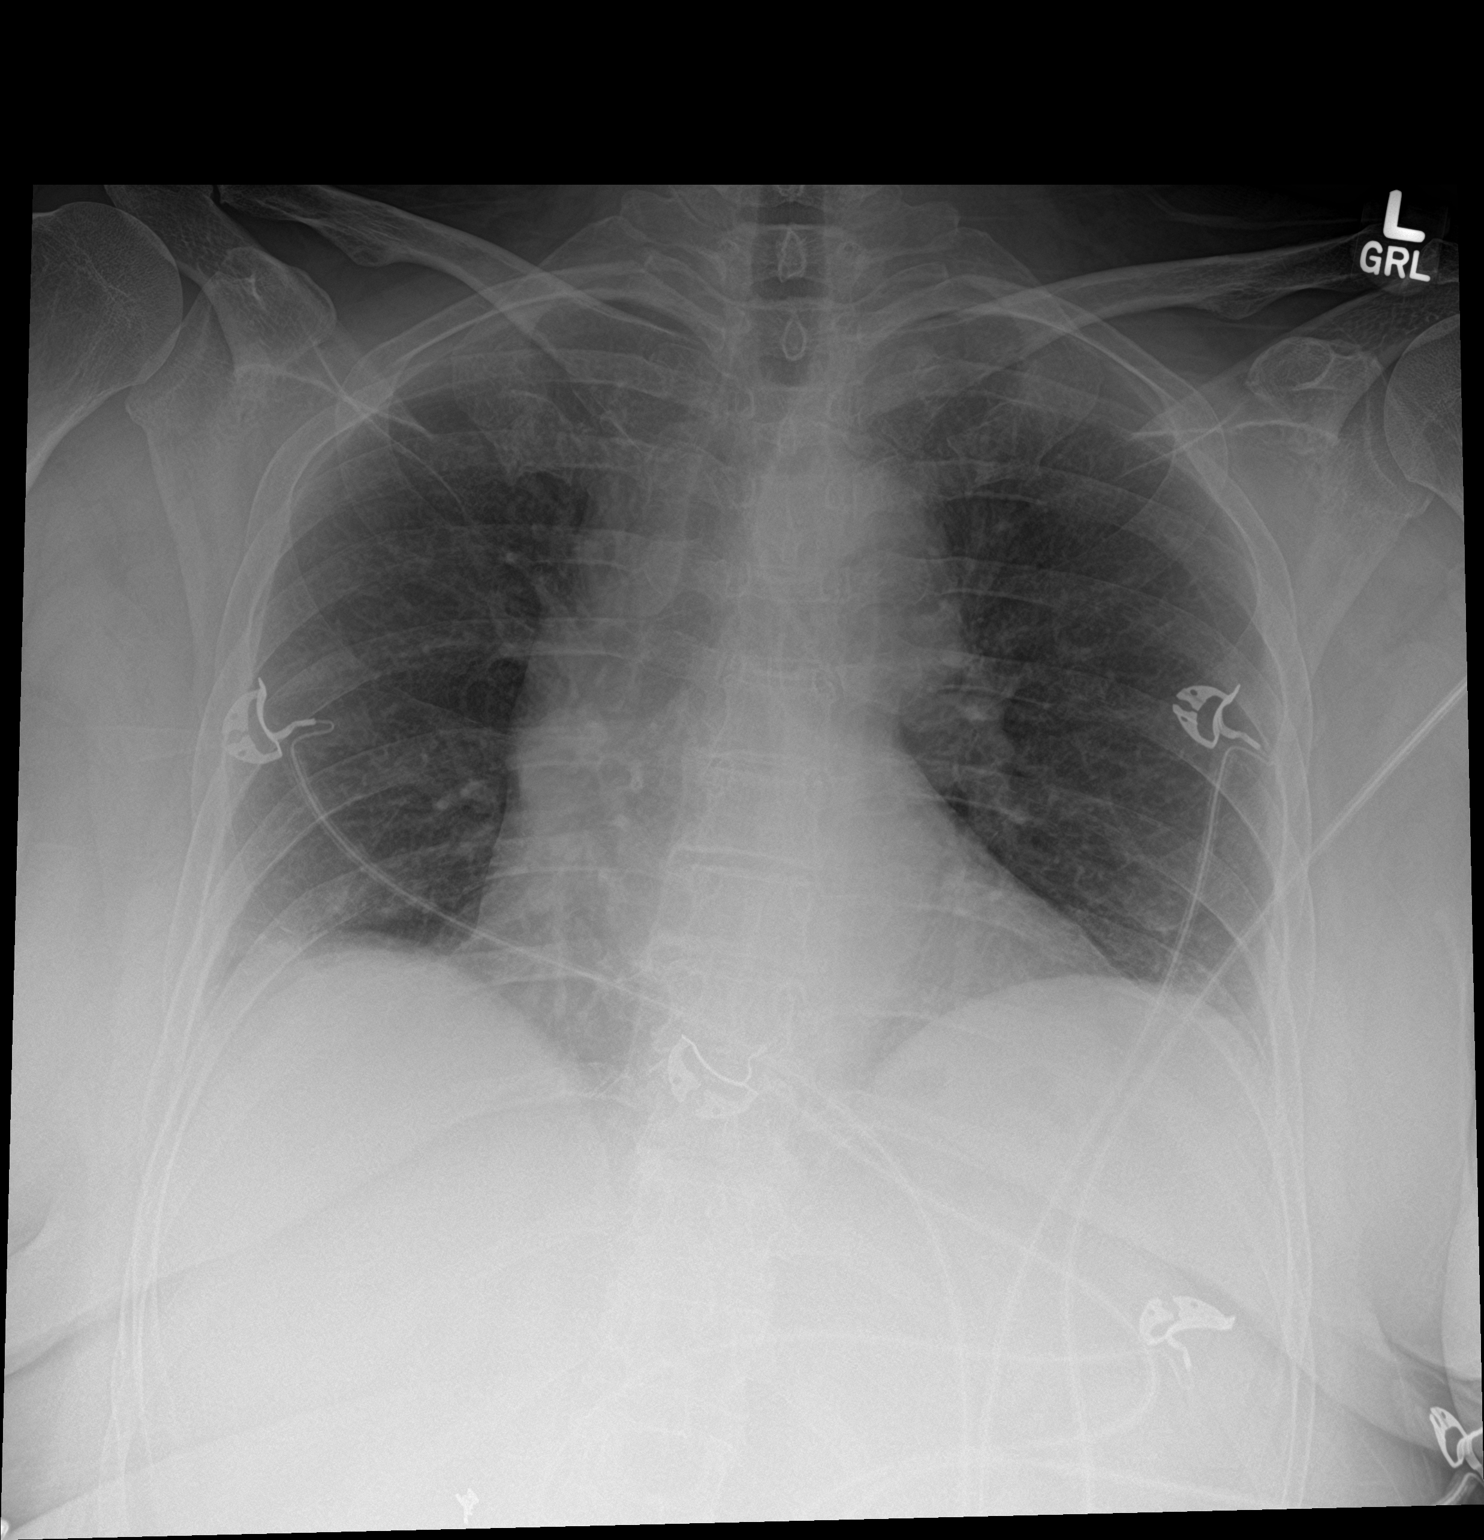

[1 of 1 positions shown; findings below may reference images not displayed]

FINDINGS: Shallow inspiration. Mild cardiac enlargement. No vascular
congestion, edema, or consolidation. Probable linear atelectasis in
the lung bases. No blunting of costophrenic angles. No pneumothorax.
Mediastinal contours appear intact. Degenerative changes in the
spine.
IMPRESSION: Shallow inspiration with linear atelectasis in the lung bases.

## 2019-09-22 IMAGING — MR MR HEAD W/O CM
9 of 11 series · 30 of 48 positions shown · non-contrast
Comparison: 07/23/2016

CLINICAL DATA: Altered level of consciousness.

EXAM:
MRI HEAD WITHOUT CONTRAST
MRA HEAD WITHOUT CONTRAST
TECHNIQUE: Multiplanar, multiecho pulse sequences of the brain and surrounding
structures were obtained without intravenous contrast. Angiographic
images of the head were obtained using MRA technique without
contrast.

[Series 2: GRE · sagittal · 5.0mm · 0.45mm/px · 3 of 25 slices shown (1 of 2)]
[im 1/25]
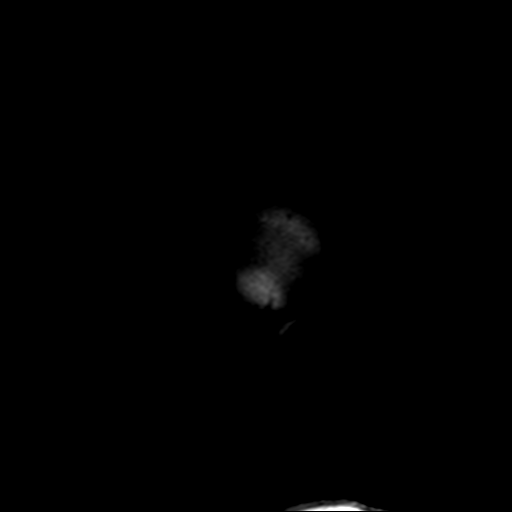
[im 13/25]
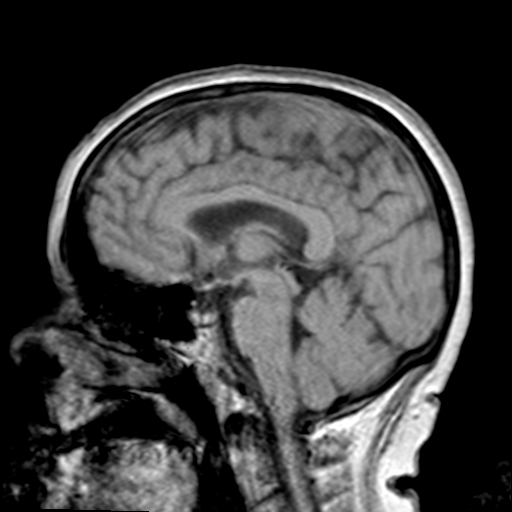
[im 25/25]
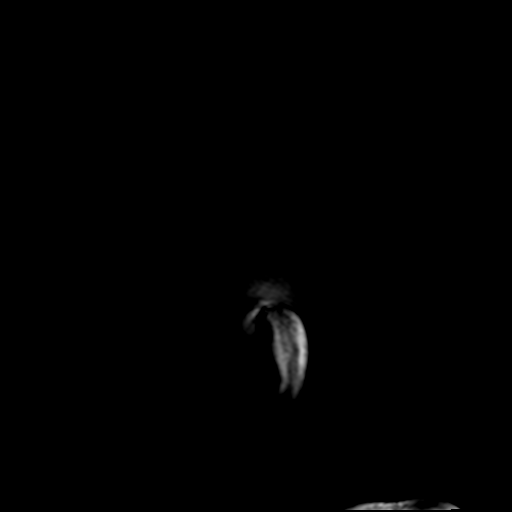

[Series 4: DWI · axial · 3.0mm · 1.80mm/px · z∈[-108,+53]mm · 4 of 49 slices shown (1 of 2)]
[im 1/49]
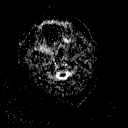
[im 17/49]
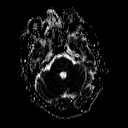
[im 33/49]
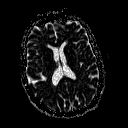
[im 49/49]
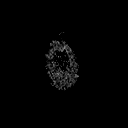

[Series 6: DWI · coronal · 3.0mm · 1.80mm/px · 4 of 49 slices shown (2 of 2)]
[im 1/49]
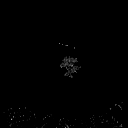
[im 17/49]
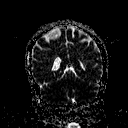
[im 33/49]
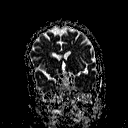
[im 49/49]
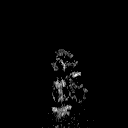

[Series 7: tof_3d_multi-slab · axial · 0.7mm · 0.37mm/px · z∈[-96,-38]mm · 5 of 156 slices shown]
[im 1/156]
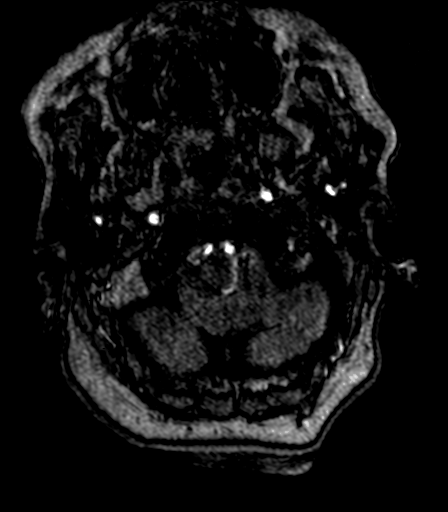
[im 24/156]
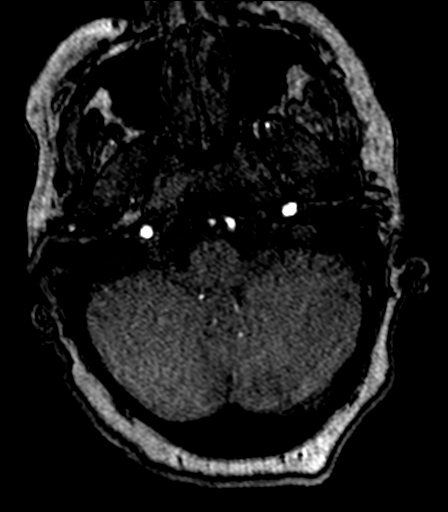
[im 48/156]
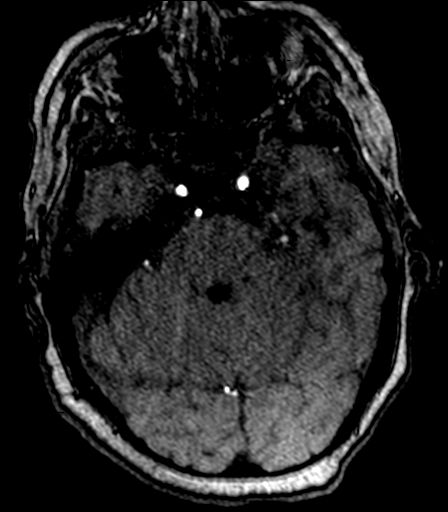
[im 72/156]
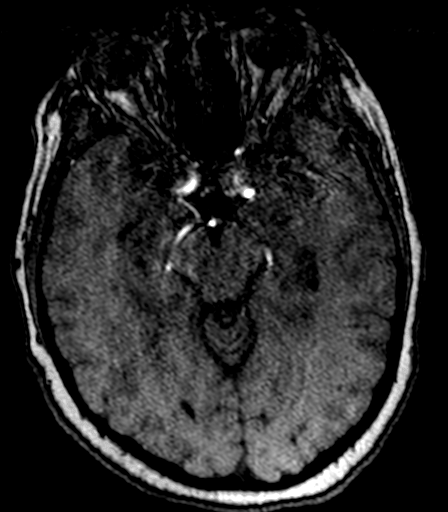
[im 84/156]
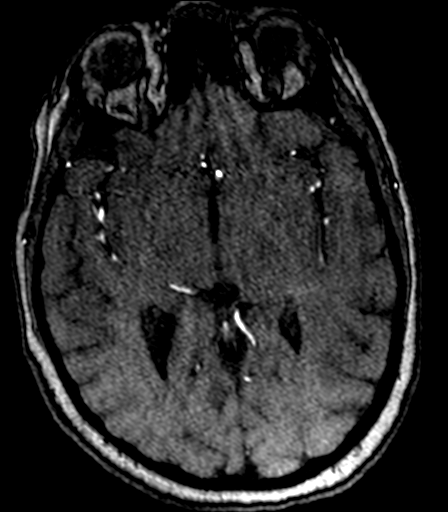

[Series 12: T2 · axial · 5.0mm · 0.45mm/px · z∈[-87,+67]mm · 2 of 25 slices shown (1 of 3)]
[im 1/25]
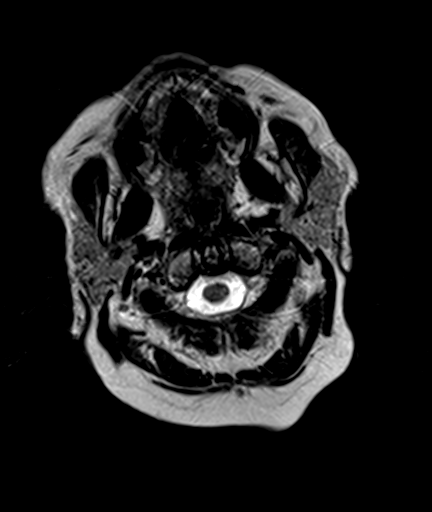
[im 25/25]
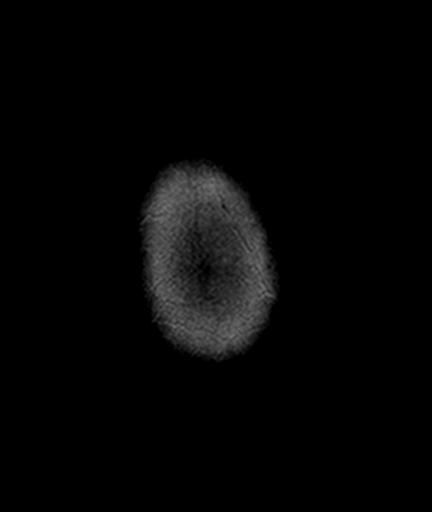

[Series 13: FLAIR · axial · 3.0mm · 0.45mm/px · z∈[-87,+67]mm · 5 of 53 slices shown]
[im 1/53]
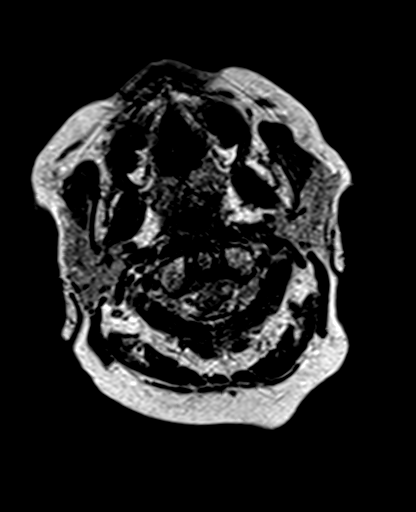
[im 14/53]
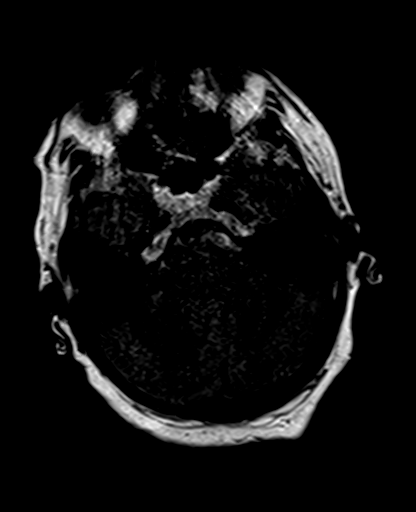
[im 27/53]
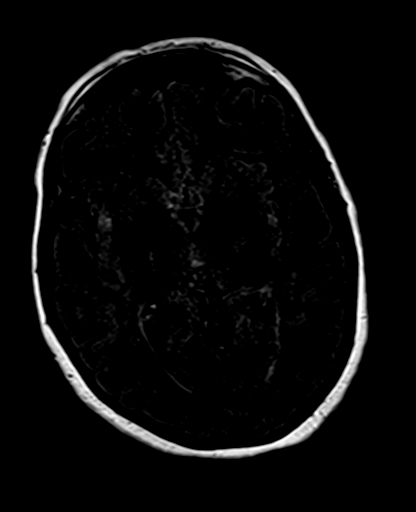
[im 40/53]
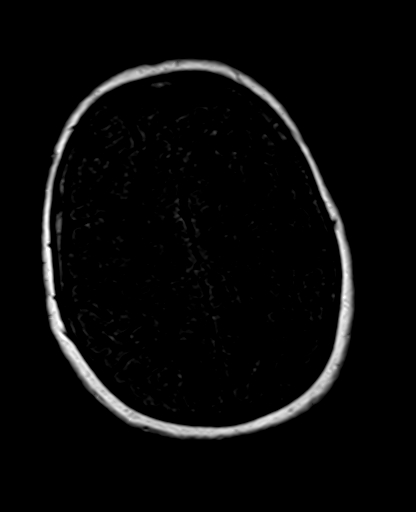
[im 53/53]
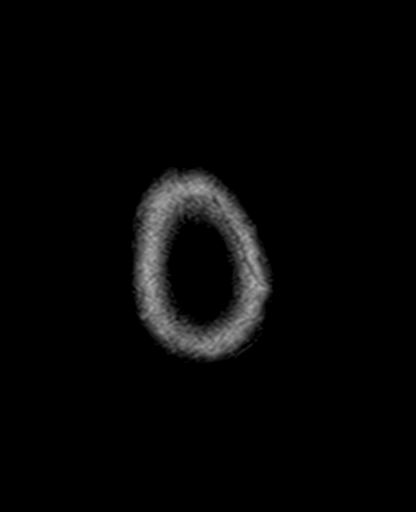

[Series 14: T2 · axial · 5.0mm · 1.20mm/px · z∈[-88,+66]mm · 2 of 25 slices shown (2 of 3)]
[im 1/25]
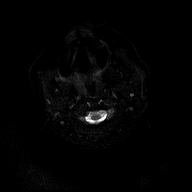
[im 25/25]
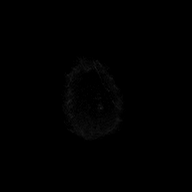

[Series 15: GRE · axial · 5.0mm · 0.45mm/px · z∈[-88,+66]mm · 2 of 25 slices shown (2 of 2)]
[im 1/25]
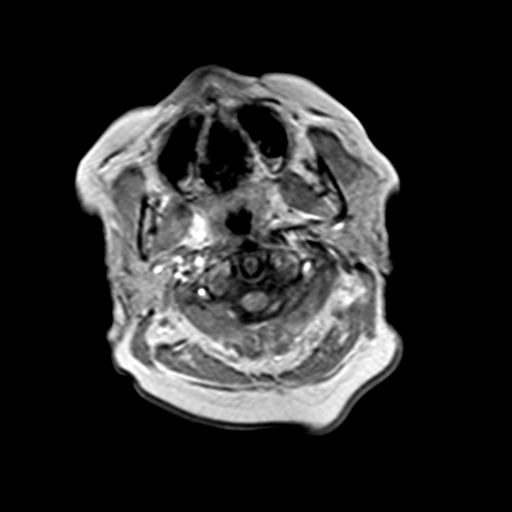
[im 25/25]
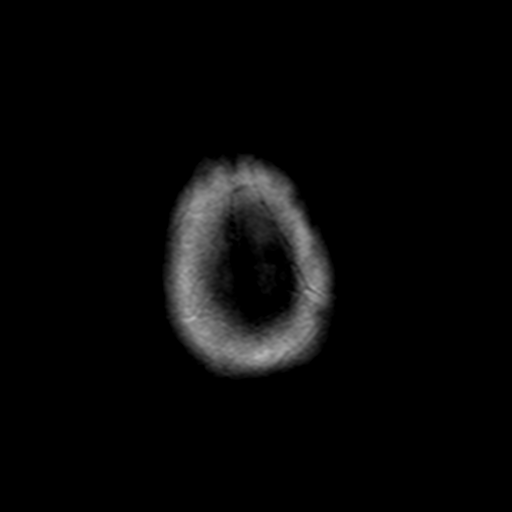

[Series 16: T2 · coronal · 5.0mm · 0.43mm/px · 3 of 30 slices shown (3 of 3)]
[im 1/30]
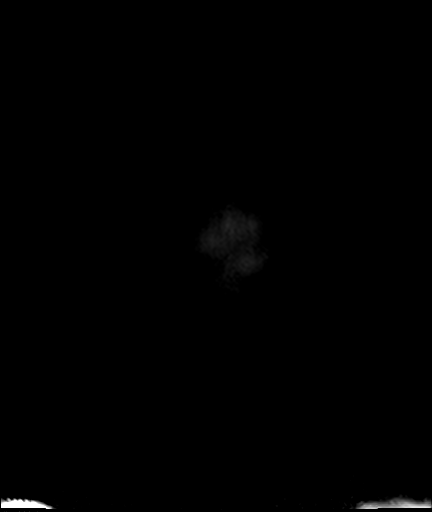
[im 15/30]
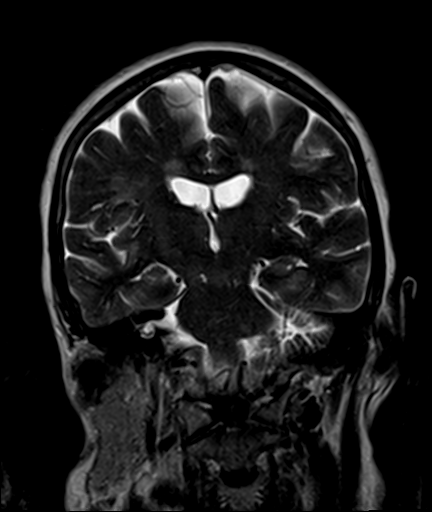
[im 30/30]
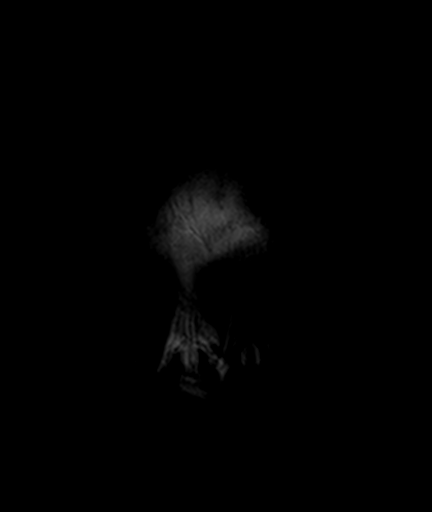

[30 of 48 positions shown; findings below may reference images not displayed]

FINDINGS: MRI HEAD FINDINGS

Brain: No acute infarction, hemorrhage, hydrocephalus, extra-axial
collection or mass lesion. Patchy FLAIR hyperintensity in the
cerebral white matter that is stable from prior. Pattern is
nonspecific, medical history suggests chronic small vessel ischemia.
Normal brain volume.

Vascular: Normal dural venous sinus flow voids. Arterial findings
noted below.

Skull and upper cervical spine: No evidence of marrow lesion

Sinuses/Orbits: Negative

MRA HEAD FINDINGS

The carotid, vertebral, and basilar arteries are smooth and
diffusely patent. Fetal type left PCA. An nterior communicating
artery is present. Accounting for motion artifact no occlusion or
flow limiting stenosis is suspected in the major anterior and
posterior circulation vessels. No convincing aneurysm.
IMPRESSION: Brain MRI:

1. No acute finding or change from 5485.
2. Motion degraded.
3. Mild white matter disease.

Intracranial MRA:

Negative when accounting for motion artifact.

## 2019-10-24 DIAGNOSIS — U071 COVID-19: Secondary | ICD-10-CM

## 2019-10-24 HISTORY — DX: COVID-19: U07.1

## 2019-10-26 ENCOUNTER — Other Ambulatory Visit: Payer: Self-pay

## 2019-10-26 ENCOUNTER — Emergency Department
Admission: EM | Admit: 2019-10-26 | Discharge: 2019-10-26 | Disposition: A | Discharge status: Home / Self Care | Payer: Medicare Other | Attending: Student in an Organized Health Care Education/Training Program | Admitting: Student in an Organized Health Care Education/Training Program

## 2019-10-26 ENCOUNTER — Emergency Department: Payer: Medicare Other

## 2019-10-26 DIAGNOSIS — M79641 Pain in right hand: Secondary | ICD-10-CM | POA: Diagnosis present

## 2019-10-26 DIAGNOSIS — I110 Hypertensive heart disease with heart failure: Secondary | ICD-10-CM | POA: Insufficient documentation

## 2019-10-26 DIAGNOSIS — Z7901 Long term (current) use of anticoagulants: Secondary | ICD-10-CM | POA: Diagnosis not present

## 2019-10-26 DIAGNOSIS — Z79899 Other long term (current) drug therapy: Secondary | ICD-10-CM | POA: Insufficient documentation

## 2019-10-26 DIAGNOSIS — M1810 Unilateral primary osteoarthritis of first carpometacarpal joint, unspecified hand: Secondary | ICD-10-CM

## 2019-10-26 DIAGNOSIS — Z96651 Presence of right artificial knee joint: Secondary | ICD-10-CM | POA: Diagnosis not present

## 2019-10-26 DIAGNOSIS — J449 Chronic obstructive pulmonary disease, unspecified: Secondary | ICD-10-CM | POA: Insufficient documentation

## 2019-10-26 DIAGNOSIS — M1811 Unilateral primary osteoarthritis of first carpometacarpal joint, right hand: Secondary | ICD-10-CM | POA: Diagnosis not present

## 2019-10-26 DIAGNOSIS — I509 Heart failure, unspecified: Secondary | ICD-10-CM | POA: Insufficient documentation

## 2019-10-26 DIAGNOSIS — F17210 Nicotine dependence, cigarettes, uncomplicated: Secondary | ICD-10-CM | POA: Insufficient documentation

## 2019-10-26 MED ORDER — PREDNISONE 10 MG PO TABS: 10.0000 mg | ORAL_TABLET | Freq: Every day | ORAL | 0 refills | Status: DC

## 2019-10-26 MED ORDER — MELOXICAM 15 MG PO TABS: 15.0000 mg | ORAL_TABLET | Freq: Every day | ORAL | 0 refills | Status: DC

## 2019-10-26 NOTE — ED Provider Notes (Signed)
[facility] Emergency Department Provider Note  ____________________________________________  Time seen: Approximately 7:29 PM  I have reviewed the triage vital signs and the nursing notes.   HISTORY  Chief Complaint Arm Pain    HPI Carla [name] is a 59 y.o. female who presents the emergency department complaining of right thumb pain.  Patient reports that she was seen in urgent care 5 days ago for complaint of right thumb pain.  This was nontraumatic in nature.  Patient reports that she was diagnosed with arthritis, placed on steroid and anti-inflammatory.  Patient reports that they did not do an x-ray and is concerned that something else may be going on as the steroid and anti-inflammatory have not improved her symptoms.  Patient denies any injury to the thumb.  No swelling.  No other complaints at this time.         Past Medical History:  Diagnosis Date  . Abdominal aortic atherosclerosis ([place])    a. 05/2017 CTA abd/pelvis: significant atherosclerotic dzs of the infrarenal abd Ao w/ some mural thrombus. No aneurysm or dissection.  . Abdominal pain   . Acute focal ischemia of small intestine (HCC)   . Acute right-sided low back pain with right-sided sciatica 06/13/2017  . AKI (acute kidney injury) ([place]) 07/29/2017  . Baker's cyst of knee, right    a. 07/2016 U/S: 4.1 x 1.4 x 2.9 cystic structure in R poplitetal fossa.  . Bell's palsy   . Borderline diabetes   . Cancer ([place])    skin cancer on nose  . Chest pain    a. 08/2012 Lexiscan MV: EF 54%, non ischemia/infarct.  . CHF (congestive heart failure) ([place])   . Chronic idiopathic constipation   . Chronic mesenteric ischemia ([place])   . Diastolic dysfunction    a. 05/2017 Echo: Ef 60-65%, no rwma, Gr1 DD, no source of cardiac emboli.  . 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 05/2017 CTA Abd/pelvis: apparent thrombus or embolus in prox SMA (70-90%); b. 05/2017 catheter directed tPA, mechanical  thrombectomy, and stenting of the SMA.  . Essential hypertension with goal blood pressure less than 140/90 01/19/2016  . Gastroesophageal reflux disease 01/19/2016  . GERD (gastroesophageal reflux disease)   . History of kidney stones   . Hyperlipidemia   . Hypertension   . Hypotension 07/29/2017  . Morbid obesity with BMI of 40.0-44.9, adult ([place])   . Occlusive mesenteric ischemia ([place]) 06/19/2017  . Personal history of other malignant neoplasm of skin 06/01/2016  . Primary osteoarthritis of both knees 06/13/2017  . Recurrent major depressive disorder ([place]) 01/19/2016  . SBO (small bowel obstruction) ([place]) 08/06/2017  . 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 Urinary tract infection 01/09/2014    Patient Active Problem List   Diagnosis Date Noted  . Pharmacologic therapy 03/26/2019  . Disorder of skeletal system 03/26/2019  . Problems influencing health status 03/26/2019  . Chronic low back pain (Secondary area of Pain) (Bilateral) w/o sciatica 03/26/2019  . Rib pain on left side 03/25/2019  . Aortic atherosclerosis ([place]) 09/12/2018  . COPD (chronic obstructive pulmonary disease) ([place]) 09/12/2018  . Moderate episode of recurrent major depressive disorder ([place]) 09/12/2018  . Lumbar spondylosis 09/06/2018  . History of total knee replacement (Right) 07/11/2018  . Ventral hernia with obstruction and without gangrene 07/02/2018  . Paroxysmal atrial fibrillation ([place]) 06/15/2018  . Accelerated hypertension 04/08/2018  . Acute encephalopathy 04/08/2018  . DDD (degenerative disc disease), lumbosacral 09/25/2017  .  Osteoarthritis of knee 09/25/2017  . Adjustment disorder with anxiety 08/15/2017  . Chronic idiopathic constipation   . Protein-calorie malnutrition, severe 08/08/2017  . SBO (small bowel obstruction) ([place]) 08/06/2017  . Chronic mesenteric ischemia ([employer])   . AKI (acute kidney injury) ([place]) 07/29/2017  . Hypotension 07/29/2017  . Embolus of superior mesenteric artery  ([place])   . Abdominal pain   . Occlusive mesenteric ischemia ([place]) 06/19/2017  . 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 Acute focal ischemia of small intestine (HCC)   . Acute right-sided low back pain with right-sided sciatica 06/13/2017  . Osteoarthritis of knee (Bilateral) 06/13/2017  . Chronic pain syndrome 09/13/2016  . Morbid obesity with BMI of 40.0-44.9, adult ([place]) 07/16/2016  . Personal history of other malignant neoplasm of skin 06/01/2016  . Chronic knee pain (Primary Area of Pain) (Bilateral) 01/19/2016  . HTN (hypertension) 01/19/2016  . Gastroesophageal reflux disease 01/19/2016  . Hyperlipidemia 01/19/2016  . Prediabetes 01/19/2016  . Recurrent major depressive disorder ([place]) 01/19/2016  . Urinary tract infection 01/09/2014  . Chronic pelvic pain in female 11/14/2013  . Mixed stress and urge urinary incontinence 11/14/2013  . Chest pain 08/06/2012    Past Surgical History:  Procedure Laterality Date  . APPENDECTOMY    . CHOLECYSTECTOMY    . LAPAROTOMY N/A 08/08/2017   Procedure: EXPLORATORY LAPAROTOMY POSSIBLE BOWEL RESECTION;  Surgeon: Jules Husbands, MD;  Location: [facility] ORS;  Service: General;  Laterality: N/A;  . TEE WITHOUT CARDIOVERSION N/A 06/22/2017   Procedure: TRANSESOPHAGEAL ECHOCARDIOGRAM (TEE);  Surgeon: Wellington Hampshire, MD;  Location: [facility] ORS;  Service: Cardiovascular;  Laterality: N/A;  . TOTAL KNEE ARTHROPLASTY Right 07/11/2018   Procedure: TOTAL KNEE ARTHROPLASTY;  Surgeon: Corky Mull, MD;  Location: [facility] ORS;  Service: Orthopedics;  Laterality: Right;  [name] VAGINAL HYSTERECTOMY    . VISCERAL ARTERY INTERVENTION N/A 06/20/2017   Procedure: Visceral Artery Intervention, possible aortic thrombectomy;  Surgeon: Algernon Huxley, MD;  Location: [facility];  Service: Cardiovascular;  Laterality: N/A;  . VISCERAL ARTERY INTERVENTION N/A 01/28/2018   Procedure: VISCERAL ARTERY INTERVENTION;  Surgeon: Algernon Huxley, MD;  Location: [facility];  Service: Cardiovascular;  Laterality: N/A;    Prior to Admission medications   Medication Sig Start Date End Date Taking? Authorizing Provider  amLODipine (NORVASC) 5 MG tablet Take 1 tablet (5 mg total) by mouth daily. 04/10/18   Gladstone Lighter, MD  apixaban (ELIQUIS) 5 MG TABS tablet Take 5 mg by mouth 2 (two) times daily.    [provider]  atorvastatin (LIPITOR) 80 MG tablet Take 80 mg by mouth at bedtime.     [provider]  clotrimazole-betamethasone (LOTRISONE) cream Apply 1 application topically 2 (two) times daily as needed (for dry skin).     [provider]  diphenhydrAMINE (BENADRYL) 25 MG tablet Take 1 tablet (25 mg total) by mouth every 8 (eight) hours as needed for itching or allergies. 02/04/19   Hillary Bow, MD  DULoxetine (CYMBALTA) 60 MG capsule Take 60 mg by mouth daily.    [provider]  ferrous sulfate 325 (65 FE) MG tablet Take 325 mg by mouth daily.    [provider]  HYDROcodone-acetaminophen (NORCO) 5-325 MG tablet Take 1 tablet by mouth every 8 (eight) hours as needed for moderate pain. 07/11/19   Laban Emperor, PA-C  lidocaine (LIDODERM) 5 % Place 1 patch onto the skin daily. Remove & Discard patch within 12 hours or  as directed by MD 07/11/19   Laban Emperor, PA-C  meloxicam (MOBIC) 15 MG tablet Take 1 tablet (15 mg total) by mouth daily. 10/26/19   , Charline Bills, PA-C  metoprolol tartrate (LOPRESSOR) 50 MG tablet Take 1 tablet (50 mg total) by mouth 2 (two) times daily. 06/15/18   Bettey Costa, MD  omeprazole (PRILOSEC) 20 MG capsule Take 20 mg by mouth 2 (two) times daily before a meal.    [provider]  ondansetron (ZOFRAN) 4 MG tablet Take 1 tablet (4 mg total) by mouth every 8 (eight) hours as needed. Patient taking differently: Take 4 mg by mouth every 8 (eight) hours as needed for nausea or vomiting.  11/10/18   Nance Pear, MD  predniSONE (DELTASONE) 10 MG tablet Take 1 tablet (10  mg total) by mouth daily. 10/26/19   , Charline Bills, PA-C    Allergies Bactrim [sulfamethoxazole-trimethoprim]  Family History  Problem Relation Age of Onset  . Hypertension Mother   . Heart disease Mother   . Heart attack Mother   . Breast cancer Mother   . Hypertension Father   . Breast cancer Paternal Aunt     Social History Social History   Tobacco Use  . Smoking status: Current Every Day Smoker    Packs/day: 0.50    Types: Cigarettes  . Smokeless tobacco: Never Used  Substance Use Topics  . Alcohol use: No  . Drug use: No     Review of Systems  Constitutional: No fever/chills Eyes: No visual changes. No discharge ENT: No upper respiratory complaints. Cardiovascular: no chest pain. Respiratory: no cough. No SOB. Gastrointestinal: No abdominal pain.  No nausea, no vomiting.  No diarrhea.  No constipation. Musculoskeletal: Positive for right thumb pain Skin: Negative for rash, abrasions, lacerations, ecchymosis. Neurological: Negative for headaches, focal weakness or numbness. 10-point ROS otherwise negative.  ____________________________________________   PHYSICAL EXAM:  VITAL SIGNS: ED Triage Vitals  Enc Vitals Group     BP 10/26/19 1706 (!) 143/95     Pulse Rate 10/26/19 1706 (!) 128     Resp 10/26/19 1706 18     Temp 10/26/19 1706 98.6 F (37 C)     Temp Source 10/26/19 1706 Oral     SpO2 10/26/19 1706 95 %     Weight 10/26/19 1708 240 lb (108.9 kg)     Height 10/26/19 1708 5\' 2"  (1.575 m)     Head Circumference --      Peak Flow --      Pain Score 10/26/19 1707 8     Pain Loc --      Pain Edu? --      Excl. in [place]? --      Constitutional: Alert and oriented. Well appearing and in no acute distress. Eyes: Conjunctivae are normal. PERRL. EOMI. Head: Atraumatic. ENT:      Ears:       Nose: No congestion/rhinnorhea.      Mouth/Throat: Mucous membranes are moist.  Neck: No stridor.    Cardiovascular: Normal rate, regular rhythm. Normal  S1 and S2.  Good peripheral circulation. Respiratory: Normal respiratory effort without tachypnea or retractions. Lungs CTAB. Good air entry to the bases with no decreased or absent breath sounds. Musculoskeletal: Full range of motion to all extremities. No gross deformities appreciated.  Visualization of the right hand reveals no deformity.  No evidence of trauma.  Patient has no erythema or edema appreciated to the hand.  Patient is tender to palpation along  the [facility] joint of the hand extending along the first phalanx.  No palpable abnormality.  No warmth to palpation.  No other significant tenderness to the right hand.  Sensation and capillary refill intact all digits Neurologic:  Normal speech and language. No gross focal neurologic deficits are appreciated.  Skin:  Skin is warm, dry and intact. No rash noted. Psychiatric: Mood and affect are normal. Speech and behavior are normal. Patient exhibits appropriate insight and judgement.   ____________________________________________   LABS (all labs ordered are listed, but only abnormal results are displayed)  Labs Reviewed - No data to display ____________________________________________  EKG   ____________________________________________  RADIOLOGY I personally viewed and evaluated these images as part of my medical decision making, as well as reviewing the written report by the radiologist.  DG Wrist Complete Right  Result Date: 10/26/2019 CLINICAL DATA:  Wrist pain EXAM: RIGHT WRIST - COMPLETE 3+ VIEW COMPARISON:  None. FINDINGS: No fracture or malalignment. Moderate arthritis at the first [facility] joint. Mild soft tissue swelling is present IMPRESSION: No acute osseous abnormality.  Arthritis at the first [facility] joint Electronically Signed   By: Donavan Foil M.D.   On: 10/26/2019 20:23    ____________________________________________    PROCEDURES  Procedure(s) performed:    Procedures    Medications - No data to  display   ____________________________________________   INITIAL IMPRESSION / ASSESSMENT AND PLAN / ED COURSE  Pertinent labs & imaging results that were available during my care of the patient were reviewed by me and considered in my medical decision making (see chart for details).  Review of the [place] CSRS was performed in accordance of the [place] prior to dispensing any controlled drugs.           Patient's diagnosis is consistent with arthritis to the [facility] joint of the right thumb.  Patient presented to the emergency department with nontraumatic right thumb pain.  No evidence of infection.  No loss of range of motion.  No loss of sensation.  Imaging is concerning for arthritic changes.  This matches the clinical picture.  Patient is given a removable thumb spica splint for symptom improvement.  Patiently placed on long course of steroid, anti-inflammatory for symptom relief.  Follow-up with orthopedics as needed..  Patient is given ED precautions to return to the ED for any worsening or new symptoms.     ____________________________________________  FINAL CLINICAL IMPRESSION(S) / ED DIAGNOSES  Final diagnoses:  Arthritis of carpometacarpal (CMC) joint of thumb      NEW MEDICATIONS STARTED DURING THIS VISIT:  ED Discharge Orders         Ordered    predniSONE (DELTASONE) 10 MG tablet  Daily    Note to Pharmacy: Take 6 pills x 2 days, 5 pills x 2 days, 4 pills x 2 days, 3 pills x 2 days, 2 pills x 2 days, and 1 pill x 2 days   10/26/19 2043    meloxicam (MOBIC) 15 MG tablet  Daily     10/26/19 2043              This chart was dictated using voice recognition software/Dragon. Despite best efforts to proofread, errors can occur which can change the meaning. Any change was purely unintentional.    Darletta Moll, PA-C 10/26/19 2044    Merlyn Lot, MD 10/27/19 [phone]

## 2019-10-26 NOTE — ED Triage Notes (Signed)
Pt presents to ED c/o R wrist pain that started this week. Denies falling on it or hitting it. States hx of arthritis. A&O, ambulatory. Refused to sit in lobby because "I don't want to be around all those sick people." states went to walk in clinic and was given prednisone but states "they never took an xray of it."

## 2019-10-26 NOTE — ED Notes (Signed)
PA Sherral Hammers made aware of pt's BP. Okay to d/c per PA. PT informed to follow up with primary care and continue normal medications.

## 2019-10-27 IMAGING — CR DG CHEST 2V
1 series · 2 of 2 positions shown · non-contrast
Comparison: 04/07/2018 and prior exams

CLINICAL DATA: Cough for 1 week

EXAM:
CHEST - 2 VIEW

[Series 1: dg chest 2 view · 0.14mm/px · 2 of 2 slices shown]
[im 1/2]
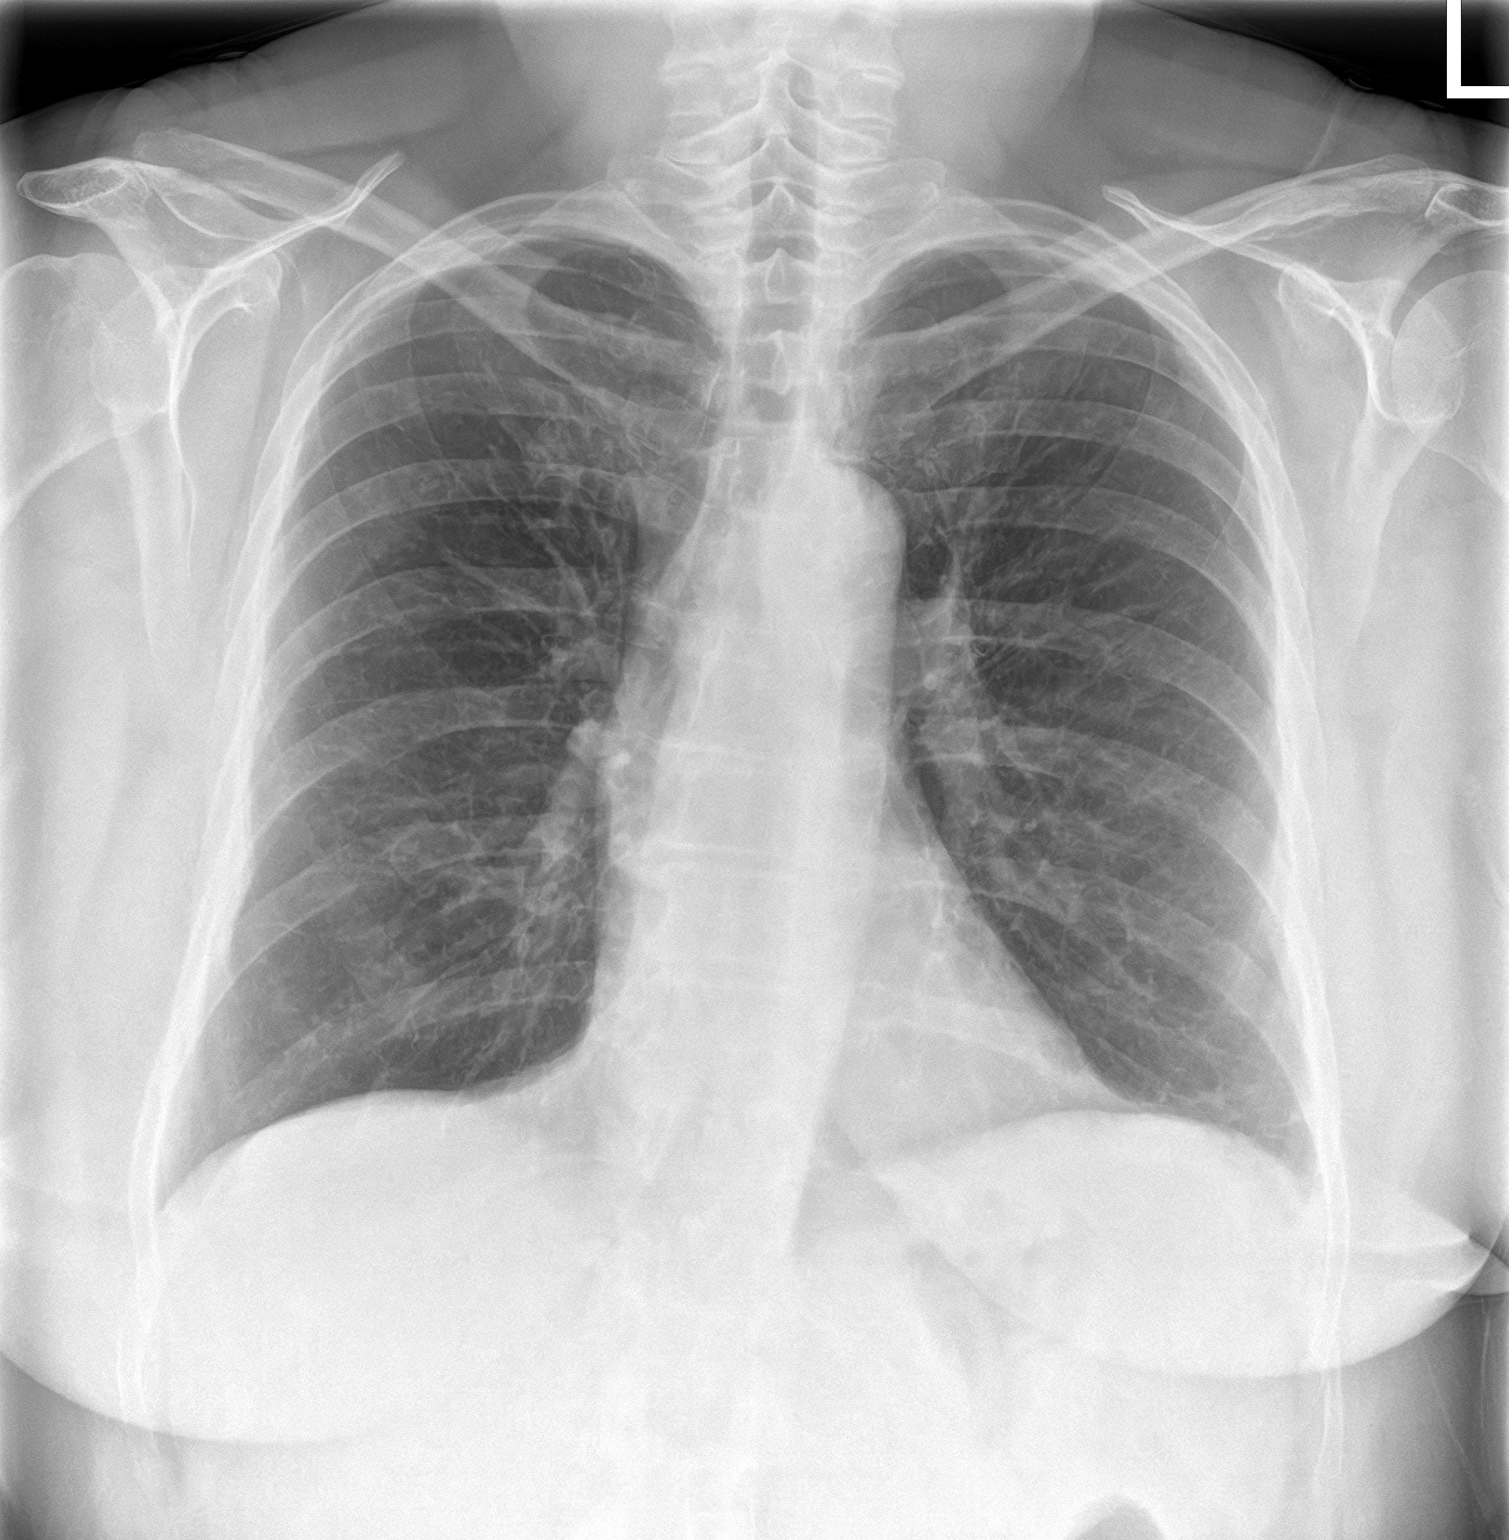
[im 2/2]
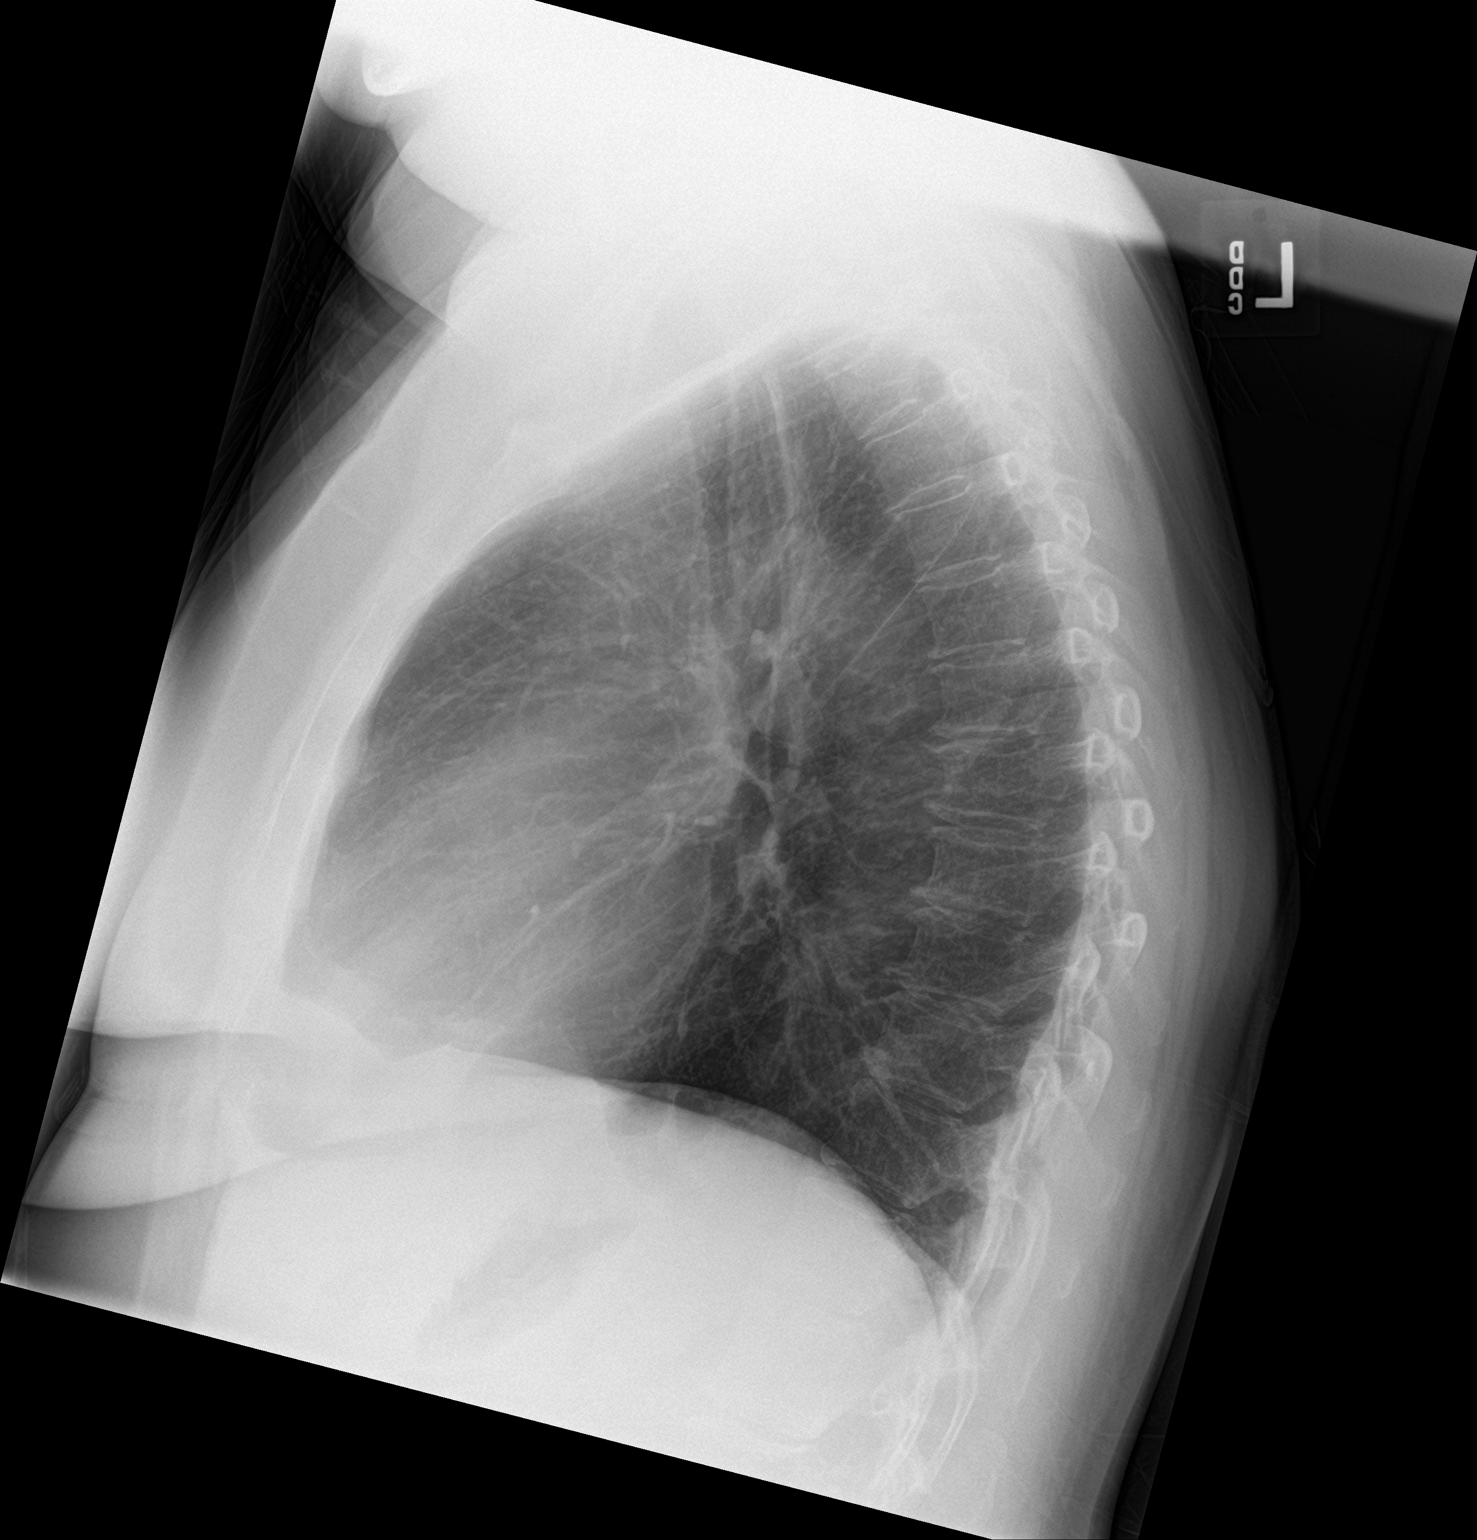

[2 of 2 positions shown; findings below may reference images not displayed]

FINDINGS: The cardiomediastinal silhouette is unremarkable.

There is no evidence of focal airspace disease, pulmonary edema,
suspicious pulmonary nodule/mass, pleural effusion, or pneumothorax.

No acute bony abnormalities are identified.
IMPRESSION: No active cardiopulmonary disease.

## 2019-11-03 ENCOUNTER — Encounter: Payer: Self-pay | Admitting: Emergency Medicine

## 2019-11-03 ENCOUNTER — Emergency Department
Admission: EM | Admit: 2019-11-03 | Discharge: 2019-11-03 | Disposition: A | Discharge status: Home / Self Care | Payer: Medicare Other | Attending: Emergency Medicine | Admitting: Emergency Medicine

## 2019-11-03 ENCOUNTER — Other Ambulatory Visit: Payer: Self-pay

## 2019-11-03 DIAGNOSIS — Z79899 Other long term (current) drug therapy: Secondary | ICD-10-CM | POA: Diagnosis not present

## 2019-11-03 DIAGNOSIS — M79601 Pain in right arm: Secondary | ICD-10-CM

## 2019-11-03 DIAGNOSIS — J449 Chronic obstructive pulmonary disease, unspecified: Secondary | ICD-10-CM | POA: Diagnosis not present

## 2019-11-03 DIAGNOSIS — F17210 Nicotine dependence, cigarettes, uncomplicated: Secondary | ICD-10-CM | POA: Insufficient documentation

## 2019-11-03 DIAGNOSIS — Z96651 Presence of right artificial knee joint: Secondary | ICD-10-CM | POA: Diagnosis not present

## 2019-11-03 DIAGNOSIS — I10 Essential (primary) hypertension: Secondary | ICD-10-CM | POA: Diagnosis not present

## 2019-11-03 DIAGNOSIS — Z7901 Long term (current) use of anticoagulants: Secondary | ICD-10-CM | POA: Diagnosis not present

## 2019-11-03 MED ORDER — LIDOCAINE 5 % EX PTCH
1.0000 | MEDICATED_PATCH | CUTANEOUS | Status: DC
  Administered 2019-11-03: 14:00:00 1 via TRANSDERMAL
  Filled 2019-11-03: qty 1

## 2019-11-03 MED ORDER — LIDOCAINE 5 % EX PTCH: 1.0000 | MEDICATED_PATCH | Freq: Two times a day (BID) | CUTANEOUS | 0 refills | Status: DC

## 2019-11-03 MED ORDER — TRAMADOL HCL 50 MG PO TABS
50.0000 mg | ORAL_TABLET | Freq: Once | ORAL | Status: AC
  Administered 2019-11-03: 14:00:00 50 mg via ORAL
  Filled 2019-11-03: qty 1

## 2019-11-03 MED ORDER — TRAMADOL HCL 50 MG PO TABS: 50.0000 mg | ORAL_TABLET | Freq: Two times a day (BID) | ORAL | 0 refills | Status: DC | PRN

## 2019-11-03 NOTE — ED Provider Notes (Signed)
[facility] Emergency Department Provider Note   ____________________________________________   First MD Initiated Contact with Patient 11/03/19 1343     (approximate)  I have reviewed the triage vital signs and the nursing notes.   HISTORY  Chief Complaint Arm Pain    HPI Carla DAIGLER is a 59 y.o. female patient complain of acute onset of right arm pain with no provocative incident.  Patient recently seen and treated for arthritis of the right wrist.  Patient said no relief with narcotic medications, steroid medications, and NSAIDs.  Patient asked for definitive pain relief.  Patient rates her pain as a 10/10.  Patient stated pain increased with any pressure applied to the right forearm.  Pain also increases with flexion extension of the wrist and pronation of the right forearm.     Patient is right-hand dominant.      Past Medical History:  Diagnosis Date  . Abdominal aortic atherosclerosis ([place])    a. 05/2017 CTA abd/pelvis: significant atherosclerotic dzs of the infrarenal abd Ao w/ some mural thrombus. No aneurysm or dissection.  . Abdominal pain   . Acute focal ischemia of small intestine (HCC)   . Acute right-sided low back pain with right-sided sciatica 06/13/2017  . AKI (acute kidney injury) ([place]) 07/29/2017  . Baker's cyst of knee, right    a. 07/2016 U/S: 4.1 x 1.4 x 2.9 cystic structure in R poplitetal fossa.  . Bell's palsy   . Borderline diabetes   . Cancer ([place])    skin cancer on nose  . Chest pain    a. 08/2012 Lexiscan MV: EF 54%, non ischemia/infarct.  . CHF (congestive heart failure) ([place])   . Chronic idiopathic constipation   . Chronic mesenteric ischemia ([place])   . Diastolic dysfunction    a. 05/2017 Echo: Ef 60-65%, no rwma, Gr1 DD, no source of cardiac emboli.  . 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 05/2017 CTA Abd/pelvis: apparent thrombus or embolus in prox SMA (70-90%); b. 05/2017 catheter directed tPA,  mechanical thrombectomy, and stenting of the SMA.  . Essential hypertension with goal blood pressure less than 140/90 01/19/2016  . Gastroesophageal reflux disease 01/19/2016  . GERD (gastroesophageal reflux disease)   . History of kidney stones   . Hyperlipidemia   . Hypertension   . Hypotension 07/29/2017  . Morbid obesity with BMI of 40.0-44.9, adult ([place])   . Occlusive mesenteric ischemia ([place]) 06/19/2017  . Personal history of other malignant neoplasm of skin 06/01/2016  . Primary osteoarthritis of both knees 06/13/2017  . Recurrent major depressive disorder ([place]) 01/19/2016  . SBO (small bowel obstruction) ([place]) 08/06/2017  . 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 Urinary tract infection 01/09/2014    Patient Active Problem List   Diagnosis Date Noted  . Pharmacologic therapy 03/26/2019  . Disorder of skeletal system 03/26/2019  . Problems influencing health status 03/26/2019  . Chronic low back pain (Secondary area of Pain) (Bilateral) w/o sciatica 03/26/2019  . Rib pain on left side 03/25/2019  . Aortic atherosclerosis ([place]) 09/12/2018  . COPD (chronic obstructive pulmonary disease) ([place]) 09/12/2018  . Moderate episode of recurrent major depressive disorder ([place]) 09/12/2018  . Lumbar spondylosis 09/06/2018  . History of total knee replacement (Right) 07/11/2018  . Ventral hernia with obstruction and without gangrene 07/02/2018  . Paroxysmal atrial fibrillation ([place]) 06/15/2018  . Accelerated hypertension 04/08/2018  . Acute encephalopathy 04/08/2018  . DDD (degenerative disc disease), lumbosacral 09/25/2017  .  Osteoarthritis of knee 09/25/2017  . Adjustment disorder with anxiety 08/15/2017  . Chronic idiopathic constipation   . Protein-calorie malnutrition, severe 08/08/2017  . SBO (small bowel obstruction) ([place]) 08/06/2017  . Chronic mesenteric ischemia ([place])   . AKI (acute kidney injury) ([place]) 07/29/2017  . Hypotension 07/29/2017  . Embolus of superior mesenteric  artery ([place])   . Abdominal pain   . Occlusive mesenteric ischemia ([place]) 06/19/2017  . 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 Acute focal ischemia of small intestine (HCC)   . Acute right-sided low back pain with right-sided sciatica 06/13/2017  . Osteoarthritis of knee (Bilateral) 06/13/2017  . Chronic pain syndrome 09/13/2016  . Morbid obesity with BMI of 40.0-44.9, adult ([place]) 07/16/2016  . Personal history of other malignant neoplasm of skin 06/01/2016  . Chronic knee pain (Primary Area of Pain) (Bilateral) 01/19/2016  . HTN (hypertension) 01/19/2016  . Gastroesophageal reflux disease 01/19/2016  . Hyperlipidemia 01/19/2016  . Prediabetes 01/19/2016  . Recurrent major depressive disorder ([place]) 01/19/2016  . Urinary tract infection 01/09/2014  . Chronic pelvic pain in female 11/14/2013  . Mixed stress and urge urinary incontinence 11/14/2013  . Chest pain 08/06/2012    Past Surgical History:  Procedure Laterality Date  . APPENDECTOMY    . CHOLECYSTECTOMY    . LAPAROTOMY N/A 08/08/2017   Procedure: EXPLORATORY LAPAROTOMY POSSIBLE BOWEL RESECTION;  Surgeon: Jules Husbands, MD;  Location: [facility] ORS;  Service: General;  Laterality: N/A;  . TEE WITHOUT CARDIOVERSION N/A 06/22/2017   Procedure: TRANSESOPHAGEAL ECHOCARDIOGRAM (TEE);  Surgeon: Wellington Hampshire, MD;  Location: [facility] ORS;  Service: Cardiovascular;  Laterality: N/A;  . TOTAL KNEE ARTHROPLASTY Right 07/11/2018   Procedure: TOTAL KNEE ARTHROPLASTY;  Surgeon: Corky Mull, MD;  Location: [facility] ORS;  Service: Orthopedics;  Laterality: Right;  [name] VAGINAL HYSTERECTOMY    . VISCERAL ARTERY INTERVENTION N/A 06/20/2017   Procedure: Visceral Artery Intervention, possible aortic thrombectomy;  Surgeon: Algernon Huxley, MD;  Location: [place] [facility];  Service: Cardiovascular;  Laterality: N/A;  . VISCERAL ARTERY INTERVENTION N/A 01/28/2018   Procedure: VISCERAL ARTERY INTERVENTION;  Surgeon: Algernon Huxley, MD;  Location: [place] [facility];  Service: Cardiovascular;  Laterality: N/A;    Prior to Admission medications   Medication Sig Start Date End Date Taking? Authorizing Provider  amLODipine (NORVASC) 5 MG tablet Take 1 tablet (5 mg total) by mouth daily. 04/10/18   Gladstone Lighter, MD  apixaban (ELIQUIS) 5 MG TABS tablet Take 5 mg by mouth 2 (two) times daily.    [provider]  atorvastatin (LIPITOR) 80 MG tablet Take 80 mg by mouth at bedtime.     [provider]  clotrimazole-betamethasone (LOTRISONE) cream Apply 1 application topically 2 (two) times daily as needed (for dry skin).     [provider]  diphenhydrAMINE (BENADRYL) 25 MG tablet Take 1 tablet (25 mg total) by mouth every 8 (eight) hours as needed for itching or allergies. 02/04/19   Hillary Bow, MD  DULoxetine (CYMBALTA) 60 MG capsule Take 60 mg by mouth daily.    [provider]  ferrous sulfate 325 (65 FE) MG tablet Take 325 mg by mouth daily.    [provider]  HYDROcodone-acetaminophen (NORCO) 5-325 MG tablet Take 1 tablet by mouth every 8 (eight) hours as needed for moderate pain. 07/11/19   Laban Emperor, PA-C  lidocaine (LIDODERM) 5 % Place 1 patch onto the skin daily. Remove & Discard patch within 12 hours or  as directed by MD 07/11/19   Laban Emperor, PA-C  lidocaine (LIDODERM) 5 % Place 1 patch onto the skin every 12 (twelve) hours. Remove & Discard patch within 12 hours or as directed by MD 11/03/19 11/02/20  Sable Feil, PA-C  meloxicam (MOBIC) 15 MG tablet Take 1 tablet (15 mg total) by mouth daily. 10/26/19   Cuthriell, Charline Bills, PA-C  metoprolol tartrate (LOPRESSOR) 50 MG tablet Take 1 tablet (50 mg total) by mouth 2 (two) times daily. 06/15/18   Bettey Costa, MD  omeprazole (PRILOSEC) 20 MG capsule Take 20 mg by mouth 2 (two) times daily before a meal.    [provider]  ondansetron (ZOFRAN) 4 MG tablet Take 1 tablet (4 mg total) by mouth every 8 (eight) hours as needed.  Patient taking differently: Take 4 mg by mouth every 8 (eight) hours as needed for nausea or vomiting.  11/10/18   Nance Pear, MD  predniSONE (DELTASONE) 10 MG tablet Take 1 tablet (10 mg total) by mouth daily. 10/26/19   Cuthriell, Charline Bills, PA-C  traMADol (ULTRAM) 50 MG tablet Take 1 tablet (50 mg total) by mouth every 12 (twelve) hours as needed. 11/03/19   Sable Feil, PA-C    Allergies Bactrim [sulfamethoxazole-trimethoprim]  Family History  Problem Relation Age of Onset  . Hypertension Mother   . Heart disease Mother   . Heart attack Mother   . Breast cancer Mother   . Hypertension Father   . Breast cancer Paternal Aunt     Social History Social History   Tobacco Use  . Smoking status: Current Every Day Smoker    Packs/day: 0.50    Types: Cigarettes  . Smokeless tobacco: Never Used  Substance Use Topics  . Alcohol use: No  . Drug use: No    Review of Systems Constitutional: No fever/chills Eyes: No visual changes. ENT: No sore throat. Cardiovascular: Denies chest pain. Respiratory: Denies shortness of breath. Gastrointestinal: No abdominal pain.  No nausea, no vomiting.  No diarrhea.  No constipation. Genitourinary: Negative for dysuria. Musculoskeletal: Right forearm pain. Skin: Negative for rash. Neurological: Negative for headaches, focal weakness or numbness. Endocrine:  Hyperlipidemia and hypertension. Allergic/Immunilogical: Bactrim  ____________________________________________   PHYSICAL EXAM:  VITAL SIGNS: ED Triage Vitals  Enc Vitals Group     BP 11/03/19 1327 129/83     Pulse Rate 11/03/19 1327 80     Resp 11/03/19 1327 16     Temp 11/03/19 1327 98.2 F (36.8 C)     Temp Source 11/03/19 1327 Oral     SpO2 11/03/19 1327 93 %     Weight 11/03/19 1325 240 lb (108.9 kg)     Height 11/03/19 1325 5\' 2"  (1.575 m)     Head Circumference --      Peak Flow --      Pain Score 11/03/19 1325 10     Pain Loc --      Pain Edu? --      Excl.  in [place]? --    Constitutional: Alert and oriented. Well appearing and in no acute distress.  Morbid obesity. Cardiovascular: Normal rate, regular rhythm. Grossly normal heart sounds.  Good peripheral circulation. Respiratory: Normal respiratory effort.  No retractions. Lungs CTAB. Genitourinary: Deferred Musculoskeletal: No lower extremity tenderness nor edema.  No joint effusions. Neurologic:  Normal speech and language. No gross focal neurologic deficits are appreciated. No gait instability. Skin:  Skin is warm, dry and intact. No rash noted. Psychiatric: Mood  and affect are normal. Speech and behavior are normal.   ____________________________________________   LABS (all labs ordered are listed, but only abnormal results are displayed)  Labs Reviewed - No data to display ____________________________________________  EKG   ____________________________________________  RADIOLOGY  ED MD interpretation:    Official radiology report(s): No results found.  ____________________________________________   PROCEDURES  Procedure(s) performed (including Critical Care):  Procedures   ____________________________________________   INITIAL IMPRESSION / ASSESSMENT AND PLAN / ED COURSE  As part of my medical decision making, I reviewed the following data within the [place]     Patient presents with nontraumatic right arm pain.  Patient physical exam is unremarkable.  Patient has history of arthritis.  Patient refractory to narcotic pain medications, steroids, and NSAIDs.  Patient had a Lidoderm patch applied on was placed in a sling.  Patient given discharge care instruction advised continue medication as directed.  Patient advised to follow-up with PCP.   BENELLI SCHUELKE was evaluated in Emergency Department on 11/03/2019 for the symptoms described in the history of present illness. She was evaluated in the context of the global COVID-19 pandemic, which  necessitated consideration that the patient might be at risk for infection with the SARS-CoV-2 virus that causes COVID-19. Institutional protocols and algorithms that pertain to the evaluation of patients at risk for COVID-19 are in a state of rapid change based on information released by regulatory bodies including the CDC and federal and state organizations. These policies and algorithms were followed during the patient's care in the ED.       ____________________________________________   FINAL CLINICAL IMPRESSION(S) / ED DIAGNOSES  Final diagnoses:  Right arm pain     ED Discharge Orders         Ordered    lidocaine (LIDODERM) 5 %  Every 12 hours     11/03/19 1356    traMADol (ULTRAM) 50 MG tablet  Every 12 hours PRN     11/03/19 1356           Note:  This document was prepared using Dragon voice recognition software and may include unintentional dictation errors.    Sable Feil, PA-C 11/03/19 1408    Earleen Newport, MD 11/03/19 [phone]

## 2019-11-03 NOTE — ED Triage Notes (Signed)
Pt to ED via POV c/o right arm pain that started about 1 hour PTA. Pt states that the pain came out of no where, denies injury or fall. Pt was seen last week for the same thing. Pt states that the pain is different from pain last week.

## 2019-11-03 NOTE — Discharge Instructions (Signed)
Follow discharge care instructions using heat instead of ice.  Take medication as directed follow-up with PCP for continued care.

## 2019-11-07 ENCOUNTER — Emergency Department: Payer: Medicare Other

## 2019-11-07 ENCOUNTER — Emergency Department
Admission: EM | Admit: 2019-11-07 | Discharge: 2019-11-07 | Disposition: A | Discharge status: Home / Self Care | Payer: Medicare Other | Attending: Emergency Medicine | Admitting: Emergency Medicine

## 2019-11-07 ENCOUNTER — Encounter: Payer: Self-pay | Admitting: Emergency Medicine

## 2019-11-07 ENCOUNTER — Other Ambulatory Visit: Payer: Self-pay

## 2019-11-07 DIAGNOSIS — I110 Hypertensive heart disease with heart failure: Secondary | ICD-10-CM | POA: Diagnosis not present

## 2019-11-07 DIAGNOSIS — I4891 Unspecified atrial fibrillation: Secondary | ICD-10-CM | POA: Diagnosis not present

## 2019-11-07 DIAGNOSIS — M79605 Pain in left leg: Secondary | ICD-10-CM | POA: Insufficient documentation

## 2019-11-07 DIAGNOSIS — M79674 Pain in right toe(s): Secondary | ICD-10-CM | POA: Insufficient documentation

## 2019-11-07 DIAGNOSIS — B351 Tinea unguium: Secondary | ICD-10-CM | POA: Diagnosis not present

## 2019-11-07 DIAGNOSIS — M79662 Pain in left lower leg: Secondary | ICD-10-CM

## 2019-11-07 DIAGNOSIS — J449 Chronic obstructive pulmonary disease, unspecified: Secondary | ICD-10-CM | POA: Diagnosis not present

## 2019-11-07 DIAGNOSIS — F17210 Nicotine dependence, cigarettes, uncomplicated: Secondary | ICD-10-CM | POA: Diagnosis not present

## 2019-11-07 DIAGNOSIS — Z7901 Long term (current) use of anticoagulants: Secondary | ICD-10-CM | POA: Diagnosis not present

## 2019-11-07 DIAGNOSIS — Z79899 Other long term (current) drug therapy: Secondary | ICD-10-CM | POA: Diagnosis not present

## 2019-11-07 DIAGNOSIS — I509 Heart failure, unspecified: Secondary | ICD-10-CM | POA: Diagnosis not present

## 2019-11-07 DIAGNOSIS — M7989 Other specified soft tissue disorders: Secondary | ICD-10-CM

## 2019-11-07 DIAGNOSIS — L819 Disorder of pigmentation, unspecified: Secondary | ICD-10-CM

## 2019-11-07 LAB — COMPREHENSIVE METABOLIC PANEL
ALT: 14 U/L (ref 0–44)
AST: 10 U/L — ABNORMAL LOW (ref 15–41)
Albumin: 3.8 g/dL (ref 3.5–5.0)
Alkaline Phosphatase: 85 U/L (ref 38–126)
Anion gap: 12 (ref 5–15)
BUN: 22 mg/dL — ABNORMAL HIGH (ref 6–20)
CO2: 21 mmol/L — ABNORMAL LOW (ref 22–32)
Calcium: 9.3 mg/dL (ref 8.9–10.3)
Chloride: 103 mmol/L (ref 98–111)
Creatinine, Ser: 0.91 mg/dL (ref 0.44–1.00)
GFR calc Af Amer: >60 mL/min (ref >60)
GFR calc non Af Amer: >60 mL/min (ref >60)
Glucose, Bld: 136 mg/dL — ABNORMAL HIGH (ref 70–99)
Potassium: 4.6 mmol/L (ref 3.5–5.1)
Sodium: 136 mmol/L (ref 135–145)
Total Bilirubin: 0.6 mg/dL (ref 0.3–1.2)
Total Protein: 8 g/dL (ref 6.5–8.1)

## 2019-11-07 LAB — CBC WITH DIFFERENTIAL/PLATELET
Abs Immature Granulocytes: 0.08 10*3/uL — ABNORMAL HIGH (ref 0.00–0.07)
Basophils Absolute: 0 10*3/uL (ref 0.0–0.1)
Basophils Relative: 0 %
Eosinophils Absolute: 0 10*3/uL (ref 0.0–0.5)
Eosinophils Relative: 0 %
HCT: 49.4 % — ABNORMAL HIGH (ref 36.0–46.0)
Hemoglobin: 15.8 g/dL — ABNORMAL HIGH (ref 12.0–15.0)
Immature Granulocytes: 1 %
Lymphocytes Relative: 17 %
Lymphs Abs: 2.3 10*3/uL (ref 0.7–4.0)
MCH: 29.8 pg (ref 26.0–34.0)
MCHC: 32 g/dL (ref 30.0–36.0)
MCV: 93.2 fL (ref 80.0–100.0)
Monocytes Absolute: 0.5 10*3/uL (ref 0.1–1.0)
Monocytes Relative: 4 %
Neutro Abs: 10.9 10*3/uL — ABNORMAL HIGH (ref 1.7–7.7)
Neutrophils Relative %: 78 %
Platelets: 254 10*3/uL (ref 150–400)
RBC: 5.3 MIL/uL — ABNORMAL HIGH (ref 3.87–5.11)
RDW: 14.6 % (ref 11.5–15.5)
WBC: 13.8 10*3/uL — ABNORMAL HIGH (ref 4.0–10.5)
nRBC: 0 % (ref 0.0–0.2)

## 2019-11-07 LAB — PROTIME-INR
INR: 1 (ref 0.8–1.2)
Prothrombin Time: 12.7 seconds (ref 11.4–15.2)

## 2019-11-07 MED ORDER — OXYCODONE-ACETAMINOPHEN 5-325 MG PO TABS: 1.0000 | ORAL_TABLET | Freq: Four times a day (QID) | ORAL | 0 refills | Status: DC | PRN

## 2019-11-07 MED ORDER — OXYCODONE-ACETAMINOPHEN 5-325 MG PO TABS
1.0000 | ORAL_TABLET | Freq: Once | ORAL | Status: AC
  Administered 2019-11-07: 13:00:00 1 via ORAL
  Filled 2019-11-07: qty 1

## 2019-11-07 NOTE — ED Triage Notes (Signed)
From [facility], sent here for eval of possible blood clot to left leg , on blood thinners (eliquis) left foot digits 2,3,4 discolored and painful

## 2019-11-07 NOTE — ED Provider Notes (Signed)
[facility] Emergency Department Provider Note   ____________________________________________   First MD Initiated Contact with Patient 11/07/19 1144     (approximate)  I have reviewed the triage vital signs and the nursing notes.   HISTORY  Chief Complaint Foot Pain   HPI Carla [name] is a 59 y.o. female was sent from [facility] to the ED for evaluation of possible blood clot to her left leg.  Patient states that her left second, third and fourth toes are painful and tender to touch.  She states that she has not had an injury to her left foot.  She has been taking ibuprofen and Tylenol without any relief.  Provider that she saw this morning and sent her to the ED as they were concerned that she may have a DVT causing her symptoms.  Patient continues to take Eliquis for a past DVT.  Currently she rates her pain as a 10/10.         Past Medical History:  Diagnosis Date  . Abdominal aortic atherosclerosis ([place])    a. 05/2017 CTA abd/pelvis: significant atherosclerotic dzs of the infrarenal abd Ao w/ some mural thrombus. No aneurysm or dissection.  . Abdominal pain   . Acute focal ischemia of small intestine (HCC)   . Acute right-sided low back pain with right-sided sciatica 06/13/2017  . AKI (acute kidney injury) ([place]) 07/29/2017  . Baker's cyst of knee, right    a. 07/2016 U/S: 4.1 x 1.4 x 2.9 cystic structure in R poplitetal fossa.  . Bell's palsy   . Borderline diabetes   . Cancer ([place])    skin cancer on nose  . Chest pain    a. 08/2012 Lexiscan MV: EF 54%, non ischemia/infarct.  . CHF (congestive heart failure) ([place])   . Chronic idiopathic constipation   . Chronic mesenteric ischemia ([place])   . Diastolic dysfunction    a. 05/2017 Echo: Ef 60-65%, no rwma, Gr1 DD, no source of cardiac emboli.  . 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 05/2017 CTA Abd/pelvis: apparent thrombus or embolus in prox SMA (70-90%); b. 05/2017 catheter directed tPA,  mechanical thrombectomy, and stenting of the SMA.  . Essential hypertension with goal blood pressure less than 140/90 01/19/2016  . Gastroesophageal reflux disease 01/19/2016  . GERD (gastroesophageal reflux disease)   . History of kidney stones   . Hyperlipidemia   . Hypertension   . Hypotension 07/29/2017  . Morbid obesity with BMI of 40.0-44.9, adult ([place])   . Occlusive mesenteric ischemia ([place]) 06/19/2017  . Personal history of other malignant neoplasm of skin 06/01/2016  . Primary osteoarthritis of both knees 06/13/2017  . Recurrent major depressive disorder ([place]) 01/19/2016  . SBO (small bowel obstruction) ([place]) 08/06/2017  . 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 Urinary tract infection 01/09/2014    Patient Active Problem List   Diagnosis Date Noted  . Pharmacologic therapy 03/26/2019  . Disorder of skeletal system 03/26/2019  . Problems influencing health status 03/26/2019  . Chronic low back pain (Secondary area of Pain) (Bilateral) w/o sciatica 03/26/2019  . Rib pain on left side 03/25/2019  . Aortic atherosclerosis ([place]) 09/12/2018  . COPD (chronic obstructive pulmonary disease) ([place]) 09/12/2018  . Moderate episode of recurrent major depressive disorder ([place]) 09/12/2018  . Lumbar spondylosis 09/06/2018  . History of total knee replacement (Right) 07/11/2018  . Ventral hernia with obstruction and without gangrene 07/02/2018  . Paroxysmal atrial fibrillation ([place]) 06/15/2018  .  Accelerated hypertension 04/08/2018  . Acute encephalopathy 04/08/2018  . DDD (degenerative disc disease), lumbosacral 09/25/2017  . Osteoarthritis of knee 09/25/2017  . Adjustment disorder with anxiety 08/15/2017  . Chronic idiopathic constipation   . Protein-calorie malnutrition, severe 08/08/2017  . SBO (small bowel obstruction) ([place]) 08/06/2017  . Chronic mesenteric ischemia ([place])   . AKI (acute kidney injury) ([place]) 07/29/2017  . Hypotension 07/29/2017  . Embolus of superior mesenteric  artery ([place])   . Abdominal pain   . Occlusive mesenteric ischemia ([place]) 06/19/2017  . 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 Acute focal ischemia of small intestine (HCC)   . Acute right-sided low back pain with right-sided sciatica 06/13/2017  . Osteoarthritis of knee (Bilateral) 06/13/2017  . Chronic pain syndrome 09/13/2016  . Morbid obesity with BMI of 40.0-44.9, adult ([place]) 07/16/2016  . Personal history of other malignant neoplasm of skin 06/01/2016  . Chronic knee pain (Primary Area of Pain) (Bilateral) 01/19/2016  . HTN (hypertension) 01/19/2016  . Gastroesophageal reflux disease 01/19/2016  . Hyperlipidemia 01/19/2016  . Prediabetes 01/19/2016  . Recurrent major depressive disorder ([place]) 01/19/2016  . Urinary tract infection 01/09/2014  . Chronic pelvic pain in female 11/14/2013  . Mixed stress and urge urinary incontinence 11/14/2013  . Chest pain 08/06/2012    Past Surgical History:  Procedure Laterality Date  . APPENDECTOMY    . CHOLECYSTECTOMY    . LAPAROTOMY N/A 08/08/2017   Procedure: EXPLORATORY LAPAROTOMY POSSIBLE BOWEL RESECTION;  Surgeon: Jules Husbands, MD;  Location: [facility] ORS;  Service: General;  Laterality: N/A;  . TEE WITHOUT CARDIOVERSION N/A 06/22/2017   Procedure: TRANSESOPHAGEAL ECHOCARDIOGRAM (TEE);  Surgeon: Wellington Hampshire, MD;  Location: [facility] ORS;  Service: Cardiovascular;  Laterality: N/A;  . TOTAL KNEE ARTHROPLASTY Right 07/11/2018   Procedure: TOTAL KNEE ARTHROPLASTY;  Surgeon: Corky Mull, MD;  Location: [facility] ORS;  Service: Orthopedics;  Laterality: Right;  [name] VAGINAL HYSTERECTOMY    . VISCERAL ARTERY INTERVENTION N/A 06/20/2017   Procedure: Visceral Artery Intervention, possible aortic thrombectomy;  Surgeon: Algernon Huxley, MD;  Location: [place] [facility];  Service: Cardiovascular;  Laterality: N/A;  . VISCERAL ARTERY INTERVENTION N/A 01/28/2018   Procedure: VISCERAL ARTERY INTERVENTION;  Surgeon: Algernon Huxley, MD;  Location: [place] [facility];  Service: Cardiovascular;  Laterality: N/A;    Prior to Admission medications   Medication Sig Start Date End Date Taking? Authorizing Provider  amLODipine (NORVASC) 5 MG tablet Take 1 tablet (5 mg total) by mouth daily. 04/10/18   Gladstone Lighter, MD  apixaban (ELIQUIS) 5 MG TABS tablet Take 5 mg by mouth 2 (two) times daily.    [provider]  atorvastatin (LIPITOR) 80 MG tablet Take 80 mg by mouth at bedtime.     [provider]  clotrimazole-betamethasone (LOTRISONE) cream Apply 1 application topically 2 (two) times daily as needed (for dry skin).     [provider]  diphenhydrAMINE (BENADRYL) 25 MG tablet Take 1 tablet (25 mg total) by mouth every 8 (eight) hours as needed for itching or allergies. 02/04/19   Hillary Bow, MD  DULoxetine (CYMBALTA) 60 MG capsule Take 60 mg by mouth daily.    [provider]  ferrous sulfate 325 (65 FE) MG tablet Take 325 mg by mouth daily.    [provider]  lidocaine (LIDODERM) 5 % Place 1 patch onto the skin daily. Remove & Discard patch within 12 hours or as directed by MD 07/11/19   Earleen Newport,  Caryl Pina, PA-C  metoprolol tartrate (LOPRESSOR) 50 MG tablet Take 1 tablet (50 mg total) by mouth 2 (two) times daily. 06/15/18   Bettey Costa, MD  omeprazole (PRILOSEC) 20 MG capsule Take 20 mg by mouth 2 (two) times daily before a meal.    [provider]  ondansetron (ZOFRAN) 4 MG tablet Take 1 tablet (4 mg total) by mouth every 8 (eight) hours as needed. Patient taking differently: Take 4 mg by mouth every 8 (eight) hours as needed for nausea or vomiting.  11/10/18   Nance Pear, MD  oxyCODONE-acetaminophen (PERCOCET) 5-325 MG tablet Take 1 tablet by mouth every 6 (six) hours as needed for severe pain. 11/07/19   Johnn Hai, PA-C    Allergies Bactrim [sulfamethoxazole-trimethoprim]  Family History  Problem Relation Age of [place]  . Hypertension Mother   . Heart disease Mother     . Heart attack Mother   . Breast cancer Mother   . Hypertension Father   . Breast cancer Paternal Aunt     Social History Social History   Tobacco Use  . Smoking status: Current Every Day Smoker    Packs/day: 0.50    Types: Cigarettes  . Smokeless tobacco: Never Used  Substance Use Topics  . Alcohol use: No  . Drug use: No    Review of Systems Constitutional: No fever/chills Cardiovascular: Denies chest pain. Respiratory: Denies shortness of breath. Gastrointestinal: No abdominal pain.  No nausea, no vomiting.   Musculoskeletal: Left foot pain. Skin: Negative for rash. Neurological: Negative for headaches, focal weakness or numbness. ____________________________________________   PHYSICAL EXAM:  VITAL SIGNS: ED Triage Vitals  Enc Vitals Group     BP 11/07/19 1041 (!) 154/86     Pulse Rate 11/07/19 1041 71     Resp 11/07/19 1041 18     Temp 11/07/19 1041 98.4 F (36.9 C)     Temp Source 11/07/19 1041 Oral     SpO2 11/07/19 1041 96 %     Weight 11/07/19 1058 244 lb (110.7 kg)     Height 11/07/19 1058 5\' 2"  (1.575 m)     Head Circumference --      Peak Flow --      Pain Score 11/07/19 1058 10     Pain Loc --      Pain Edu? --      Excl. in [place]? --    Constitutional: Alert and oriented. Well appearing and in no acute distress. Eyes: Conjunctivae are normal.  Head: Atraumatic. Nose: No congestion/rhinnorhea. Neck: No stridor.   Cardiovascular: Normal rate, regular rhythm. Grossly normal heart sounds.  Good peripheral circulation. Respiratory: Normal respiratory effort.  No retractions. Lungs CTAB. Musculoskeletal: On examination of left foot there is no gross deformity however second, third, and fourth toes are markedly tender to light touch.  Skin is intact.  Nails on the toes have fungal involvement.  The nails on the second and third toes specifically appear to have crumbled off in layers.  No ingrown areas are seen and no erythema present.  No drainage  present.  Third toe is slightly discolored.  Capillary refill of the mentioned toes in comparison to the first and fifth are equal.  Motor sensory function intact. Neurologic:  Normal speech and language.  Skin:  Skin is warm, dry and intact.  Note as above and muscle skeletal. Psychiatric: Mood and affect are normal. Speech and behavior are normal.  ____________________________________________   LABS (all labs ordered are listed, but only  abnormal results are displayed)  Labs Reviewed  CBC WITH DIFFERENTIAL/PLATELET - Abnormal; Notable for the following components:      Result Value   WBC 13.8 (*)    RBC 5.30 (*)    Hemoglobin 15.8 (*)    HCT 49.4 (*)    Neutro Abs 10.9 (*)    Abs Immature Granulocytes 0.08 (*)    All other components within normal limits  COMPREHENSIVE METABOLIC PANEL - Abnormal; Notable for the following components:   CO2 21 (*)    Glucose, Bld 136 (*)    BUN 22 (*)    AST 10 (*)    All other components within normal limits  PROTIME-INR   RADIOLOGY   Official radiology report(s): US Venous Img Lower Unilateral Left (DVT)  Result Date: 11/07/2019 CLINICAL DATA:  Left lower extremity pain.  Cold left foot and calf. EXAM: LEFT LOWER EXTREMITY VENOUS DOPPLER ULTRASOUND TECHNIQUE: Gray-scale sonography with graded compression, as well as color Doppler and duplex ultrasound were performed to evaluate the lower extremity deep venous systems from the level of the common femoral vein and including the common femoral, femoral, profunda femoral, popliteal and calf veins including the posterior tibial, peroneal and gastrocnemius veins when visible. The superficial great saphenous vein was also interrogated. Spectral Doppler was utilized to evaluate flow at rest and with distal augmentation maneuvers in the common femoral, femoral and popliteal veins. COMPARISON:  02/14/2015 FINDINGS: Contralateral Common Femoral Vein: Respiratory phasicity is normal and symmetric with the  symptomatic side. No evidence of thrombus. Normal compressibility. Common Femoral Vein: No evidence of thrombus. Normal compressibility, respiratory phasicity and response to augmentation. Saphenofemoral Junction: No evidence of thrombus. Normal compressibility and flow on color Doppler imaging. Profunda Femoral Vein: No evidence of thrombus. Normal compressibility and flow on color Doppler imaging. Femoral Vein: No evidence of thrombus. Normal compressibility, respiratory phasicity and response to augmentation. Popliteal Vein: No evidence of thrombus. Normal compressibility, respiratory phasicity and response to augmentation. Calf Veins: No evidence of thrombus. Normal compressibility and flow on color Doppler imaging. Other Findings: Left common femoral artery, left popliteal artery, left posterior tibial artery and left anterior tibial artery are patent with normal Doppler waveforms. IMPRESSION: 1.  Negative for deep venous thrombosis in left lower extremity. 2. Visualized left lower extremity arteries are patent with normal Doppler waveforms. Electronically Signed   By: Markus Daft M.D.   On: 11/07/2019 12:25   DG Foot Complete Left  Result Date: 11/07/2019 CLINICAL DATA:  Pain at the second-fourth toes EXAM: LEFT FOOT - COMPLETE 3+ VIEW COMPARISON:  06/24/2008 FINDINGS: There is no evidence of fracture or dislocation. Normal bony mineralization. Small os navicular. No cortical destruction or periostitis. Prominent plantar calcaneal spur. There is no evidence of arthropathy or other focal bone abnormality. Soft tissues are unremarkable. IMPRESSION: No acute osseous abnormality of the left foot. Electronically Signed   By: Davina Poke D.O.   On: 11/07/2019 14:00    ____________________________________________   PROCEDURES  Procedure(s) performed (including Critical Care):  Procedures   ____________________________________________   INITIAL IMPRESSION / ASSESSMENT AND PLAN / ED COURSE  As  part of my medical decision making, I reviewed the following data within the electronic MEDICAL RECORD NUMBER Notes from prior ED visits and [place] Controlled Substance Database  59 year old female presents to the ED with complaint of pain to her left second, third, and fourth toes.  Patient was seen earlier at [facility] and sent to the ED for evaluation.  Patient currently  is taking Eliquis for a previous DVT.  Physical exam shows toes involved are extremely tender to light touch.  Skin is intact.  There are some breakage of her nails on the mentioned toes but no infection present.  Ultrasound shows no DVT or vascular compromise.  X-ray confirmed that there was no fracture or acute bony injury to the toes.  While in the ED patient was given Percocet which helped with her pain.  Patient also has an appointment with Dr. Luana Shu in podiatry on Wednesday, 11/12/2019 at 10 AM.  Patient will keep this appointment.  She is aware that she may return to the emergency department over the weekend if any severe worsening of her symptoms.  ____________________________________________   FINAL CLINICAL IMPRESSION(S) / ED DIAGNOSES  Final diagnoses:  Pain in right toe(s)  Nail fungus     ED Discharge Orders         Ordered    oxyCODONE-acetaminophen (PERCOCET) 5-325 MG tablet  Every 6 hours PRN     11/07/19 1417           Note:  This document was prepared using Dragon voice recognition software and may include unintentional dictation errors.    Johnn Hai, PA-C 11/07/19 1517    Harvest Dark, MD 11/07/19 1531

## 2019-11-07 NOTE — ED Notes (Signed)
See triage note. Pt c/o pain to L 2nd, 3rd, and 4th toes. 3rd and 4th darker in color than other toes, cap refill 4s. Cap refill for other toes <3s. Pt reports area is tender to touch.

## 2019-11-07 NOTE — Discharge Instructions (Addendum)
You have an appointment with Dr. Luana Shu on Wednesday, 11/12/2019.  At 10:00 AM.  His office is located in [facility].  Continue regular medications as prescribed by your doctor.  Also a prescription for Percocet was sent to your pharmacy.  Take this as needed for pain.  Be aware that this medication could cause drowsiness and increase your risk for falling.  Return to the emergency department over the weekend if any severe worsening of your symptoms.  Ultrasound shows that you do not have a blood clot in your leg and also x-rays of your toes were negative for any acute bony injury. [name]

## 2019-11-10 ENCOUNTER — Telehealth (INDEPENDENT_AMBULATORY_CARE_PROVIDER_SITE_OTHER): Payer: Self-pay

## 2019-11-10 DIAGNOSIS — G5601 Carpal tunnel syndrome, right upper limb: Secondary | ICD-10-CM | POA: Insufficient documentation

## 2019-11-10 NOTE — Telephone Encounter (Signed)
We can bring her in with ABIs (with toe PPGs) and L art duplex, I don't believe she is ischemic however because the ultrasound tech noted during her DVT study, there was no evidence of arterial occlusion and she had normal waveforms, but we can do a closer look. Dew or me

## 2019-11-11 ENCOUNTER — Other Ambulatory Visit (INDEPENDENT_AMBULATORY_CARE_PROVIDER_SITE_OTHER): Payer: Self-pay | Admitting: Nurse Practitioner

## 2019-11-11 DIAGNOSIS — R209 Unspecified disturbances of skin sensation: Secondary | ICD-10-CM

## 2019-11-11 DIAGNOSIS — M79672 Pain in left foot: Secondary | ICD-10-CM

## 2019-11-14 ENCOUNTER — Encounter (INDEPENDENT_AMBULATORY_CARE_PROVIDER_SITE_OTHER): Payer: Medicare Other

## 2019-11-14 ENCOUNTER — Other Ambulatory Visit: Payer: Self-pay

## 2019-11-14 ENCOUNTER — Encounter (INDEPENDENT_AMBULATORY_CARE_PROVIDER_SITE_OTHER): Payer: Self-pay

## 2019-11-14 ENCOUNTER — Ambulatory Visit (INDEPENDENT_AMBULATORY_CARE_PROVIDER_SITE_OTHER): Payer: Medicare Other | Admitting: Nurse Practitioner

## 2019-11-14 ENCOUNTER — Ambulatory Visit (INDEPENDENT_AMBULATORY_CARE_PROVIDER_SITE_OTHER): Payer: Medicare Other

## 2019-11-14 ENCOUNTER — Other Ambulatory Visit
Admission: RE | Admit: 2019-11-14 | Discharge: 2019-11-14 | Disposition: A | Discharge status: Home / Self Care | Payer: Medicare Other | Source: Ambulatory Visit | Attending: Vascular Surgery | Admitting: Vascular Surgery

## 2019-11-14 ENCOUNTER — Encounter (INDEPENDENT_AMBULATORY_CARE_PROVIDER_SITE_OTHER): Payer: Self-pay | Admitting: Nurse Practitioner

## 2019-11-14 VITALS — BP 117/78 | HR 82 | Resp 17 | Ht 62.0 in | Wt 244.0 lb

## 2019-11-14 DIAGNOSIS — U071 COVID-19: Secondary | ICD-10-CM | POA: Insufficient documentation

## 2019-11-14 DIAGNOSIS — I480 Paroxysmal atrial fibrillation: Secondary | ICD-10-CM

## 2019-11-14 DIAGNOSIS — M79672 Pain in left foot: Secondary | ICD-10-CM

## 2019-11-14 DIAGNOSIS — I70222 Atherosclerosis of native arteries of extremities with rest pain, left leg: Secondary | ICD-10-CM

## 2019-11-14 DIAGNOSIS — R209 Unspecified disturbances of skin sensation: Secondary | ICD-10-CM | POA: Diagnosis not present

## 2019-11-14 DIAGNOSIS — Z8616 Personal history of COVID-19: Secondary | ICD-10-CM

## 2019-11-14 DIAGNOSIS — F17200 Nicotine dependence, unspecified, uncomplicated: Secondary | ICD-10-CM

## 2019-11-14 DIAGNOSIS — Z01812 Encounter for preprocedural laboratory examination: Secondary | ICD-10-CM | POA: Insufficient documentation

## 2019-11-14 HISTORY — DX: Personal history of COVID-19: Z86.16

## 2019-11-14 LAB — SARS CORONAVIRUS 2 (TAT 6-24 HRS): SARS Coronavirus 2: POSITIVE — AB

## 2019-11-14 MED ORDER — OXYCODONE-ACETAMINOPHEN 5-325 MG PO TABS: 1.0000 | ORAL_TABLET | Freq: Three times a day (TID) | ORAL | 0 refills | Status: DC | PRN

## 2019-11-14 NOTE — Progress Notes (Signed)
SUBJECTIVE:  Patient ID: Carla [name], female    DOB: 08-16-1961, 59 y.o.   MRN: [id number] Chief Complaint  Patient presents with  . Follow-up    LLE pain    HPI  Carla [name] is a 59 y.o. female presents today with extreme pain with coldness and discoloration of her left foot.  The patient states that this happened several days ago and it was suddenly.  There was no precipitating event that caused it.  She denies any trauma to the area.  She denies any fever, chills, nausea vomiting or diarrhea.  The patient does have a previous history of proximal atrial fibrillation.  She is currently on Eliquis.  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  This was several years ago however.  The patient denies feeling any sorts of arrhythmia such as sudden dizziness or palpitations in her chest.  Today the patient underwent bilateral ABIs.  Her right ABI is 1.04 with a left of 1.01.  The right TBI 0.94 with a left of 0.44.  The right toes have normal waveforms throughout the first through fifth digits.  The left toes are abnormal.  The first digit has a severely dampened waveform while the rest are all flat.  Past Medical History:  Diagnosis Date  . Abdominal aortic atherosclerosis ([place])    a. 05/2017 CTA abd/pelvis: significant atherosclerotic dzs of the infrarenal abd Ao w/ some mural thrombus. No aneurysm or dissection.  . Abdominal pain   . Acute focal ischemia of small intestine (HCC)   . Acute right-sided low back pain with right-sided sciatica 06/13/2017  . AKI (acute kidney injury) ([place]) 07/29/2017  . Baker's cyst of knee, right    a. 07/2016 U/S: 4.1 x 1.4 x 2.9 cystic structure in R poplitetal fossa.  . Bell's palsy   . Borderline diabetes   . Cancer ([place])    skin cancer on nose  . Chest pain    a. 08/2012 Lexiscan MV: EF 54%, non ischemia/infarct.  . CHF (congestive heart failure) ([place])   . Chronic  idiopathic constipation   . Chronic mesenteric ischemia ([place])   . Diastolic dysfunction    a. 05/2017 Echo: Ef 60-65%, no rwma, Gr1 DD, no source of cardiac emboli.  . 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 05/2017 CTA Abd/pelvis: apparent thrombus or embolus in prox SMA (70-90%); b. 05/2017 catheter directed tPA, mechanical thrombectomy, and stenting of the SMA.  . Essential hypertension with goal blood pressure less than 140/90 01/19/2016  . Gastroesophageal reflux disease 01/19/2016  . GERD (gastroesophageal reflux disease)   . History of kidney stones   . Hyperlipidemia   . Hypertension   . Hypotension 07/29/2017  . Morbid obesity with BMI of 40.0-44.9, adult ([place])   . Occlusive mesenteric ischemia ([place]) 06/19/2017  . Personal history of other malignant neoplasm of skin 06/01/2016  . Primary osteoarthritis of both knees 06/13/2017  . Recurrent major depressive disorder ([place]) 01/19/2016  . SBO (small bowel obstruction) ([place]) 08/06/2017  . 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 Urinary tract infection 01/09/2014    Past Surgical History:  Procedure Laterality Date  . APPENDECTOMY    . CHOLECYSTECTOMY    . LAPAROTOMY N/A 08/08/2017   Procedure: EXPLORATORY LAPAROTOMY POSSIBLE BOWEL RESECTION;  Surgeon: Jules Husbands, MD;  Location: [facility];  Service: General;  Laterality: N/A;  . TEE WITHOUT CARDIOVERSION N/A 06/22/2017  Procedure: TRANSESOPHAGEAL ECHOCARDIOGRAM (TEE);  Surgeon: Wellington Hampshire, MD;  Location: [facility];  Service: Cardiovascular;  Laterality: N/A;  . TOTAL KNEE ARTHROPLASTY Right 07/11/2018   Procedure: TOTAL KNEE ARTHROPLASTY;  Surgeon: Corky Mull, MD;  Location: [facility];  Service: Orthopedics;  Laterality: Right;  [name] VAGINAL HYSTERECTOMY    . VISCERAL ARTERY INTERVENTION N/A 06/20/2017   Procedure: Visceral Artery Intervention, possible aortic thrombectomy;  Surgeon: Algernon Huxley, MD;  Location: [facility];  Service: Cardiovascular;   Laterality: N/A;  . VISCERAL ARTERY INTERVENTION N/A 01/28/2018   Procedure: VISCERAL ARTERY INTERVENTION;  Surgeon: Algernon Huxley, MD;  Location: [facility];  Service: Cardiovascular;  Laterality: N/A;    Social History   Socioeconomic History  . Marital status: Single    Spouse name: Not on file  . Number of children: Not on file  . Years of education: Not on file  . Highest education level: Not on file  Occupational History  . Occupation: Unemployed  Tobacco Use  . Smoking status: Current Every Day Smoker    Packs/day: 0.50    Types: Cigarettes  . Smokeless tobacco: Never Used  Substance and Sexual Activity  . Alcohol use: No  . Drug use: No  . Sexual activity: Never  Other Topics Concern  . Not on file  Social History Narrative   Lives in [place] with boyfriend.  Does not routinely exercise.   Social Determinants of Health   Financial Resource Strain:   . Difficulty of Paying Living Expenses: Not on file  Food Insecurity:   . Worried About Charity fundraiser in the Last Year: Not on file  . Ran Out of Food in the Last Year: Not on file  Transportation Needs:   . Lack of Transportation (Medical): Not on file  . Lack of Transportation (Non-Medical): Not on file  Physical Activity:   . Days of Exercise per Week: Not on file  . Minutes of Exercise per Session: Not on file  Stress:   . Feeling of Stress : Not on file  Social Connections:   . Frequency of Communication with Friends and Family: Not on file  . Frequency of Social Gatherings with Friends and Family: Not on file  . Attends Religious Services: Not on file  . Active Member of Clubs or Organizations: Not on file  . Attends Archivist Meetings: Not on file  . Marital Status: Not on file  Intimate Partner Violence:   . Fear of Current or Ex-Partner: Not on file  . Emotionally Abused: Not on file  . Physically Abused: Not on file  . Sexually Abused: Not on file    Family History    Problem Relation Age of Onset  . Hypertension Mother   . Heart disease Mother   . Heart attack Mother   . Breast cancer Mother   . Hypertension Father   . Breast cancer Paternal Aunt     Allergies  Allergen Reactions  . Bactrim [Sulfamethoxazole-Trimethoprim] Itching     Review of Systems   Review of Systems: Negative Unless Checked Constitutional: [] Weight loss  [] Fever  [] Chills Cardiac: [] Chest pain   [x]  Atrial Fibrillation  [] Palpitations   [] Shortness of breath when laying flat   [] Shortness of breath with exertion. [] Shortness of breath at rest Vascular:  [] Pain in legs with walking   [] Pain in legs with standing [x] Pain in legs when laying flat   [] Claudication    [x] Pain in feet  when laying flat    [] History of DVT   [] Phlebitis   [] Swelling in legs   [] Varicose veins   [] Non-healing ulcers Pulmonary:   [] Uses home oxygen   [] Productive cough   [] Hemoptysis   [] Wheeze  [x] COPD   [] Asthma Neurologic:  [] Dizziness   [] Seizures  [] Blackouts [] History of stroke   [] History of TIA  [] Aphasia   [] Temporary Blindness   [] Weakness or numbness in arm   [] Weakness or numbness in leg Musculoskeletal:   [] Joint swelling   [] Joint pain   [] Low back pain  []  History of Knee Replacement [x] Arthritis [] back Surgeries  []  Spinal Stenosis    Hematologic:  [] Easy bruising  [] Easy bleeding   [] Hypercoagulable state   [] Anemic Gastrointestinal:  [] Diarrhea   [] Vomiting  [] Gastroesophageal reflux/heartburn   [] Difficulty swallowing. [] Abdominal pain Genitourinary:  [] Chronic kidney disease   [] Difficult urination  [] Anuric   [] Blood in urine [] Frequent urination  [] Burning with urination   [] Hematuria Skin:  [] Rashes   [] Ulcers [] Wounds Psychological:  [] History of anxiety   []  History of major depression  []  Memory Difficulties      OBJECTIVE:   Physical Exam  BP 117/78 (BP Location: Right Arm)   Pulse 82   Resp 17   Ht 5\' 2"  (1.575 m)   Wt 244 lb (110.7 kg)   BMI 44.63 kg/m   Gen:  WD/WN, NAD Head: [place]/AT, No temporalis wasting.  Ear/Nose/Throat: Hearing grossly intact, nares w/o erythema or drainage Eyes: PER, EOMI, sclera nonicteric.  Neck: Supple, no masses.  No JVD.  Pulmonary:  Good air movement, no use of accessory muscles.  Cardiac: RRR Vascular:  Great digit and third digit blue, all toes cold very tender to palpation Vessel Right Left  Dorsalis Pedis Palpable Not Palpable  Posterior Tibial Palpable Not Palpable   Gastrointestinal: soft, non-distended. No guarding/no peritoneal signs.  Musculoskeletal: M/S 5/5 throughout.  No deformity or atrophy.  Neurologic: Pain and light touch intact in extremities.  Symmetrical.  Speech is fluent. Motor exam as listed above. Psychiatric: Judgment intact, Mood & affect appropriate for pt's clinical situation. Dermatologic: No Venous rashes. No Ulcers Noted.  No changes consistent with cellulitis. Lymph : No Cervical lymphadenopathy, no lichenification or skin changes of chronic lymphedema.       ASSESSMENT AND PLAN:  1. Paroxysmal atrial fibrillation (HCC) The patient continues to take Eliquis however possible emboli could be the cause for her distal issues.  2. Atherosclerosis of native artery of left lower extremity with rest pain ([place]) Based upon the patient's noninvasive studies and previous history of atrial fibrillation is very likely that this is related to distal emboli that has caused some obstruction which is resulting in her pain.  The patient is very symptomatic and has constant pain.  The other possibility is that this patient may possibly have some sort of distal occlusion which may be treatable by angiogram.  Based on this, we will plan to do an angiogram of the left lower extremity.  I discussed with the patient that this may be purely investigative or could be interventional depending on what is found.  The patient is instructed to continue to take Eliquis.  All risk, benefits and alternatives were  discussed.  Patient agrees to proceed. - oxyCODONE-acetaminophen (PERCOCET) 5-325 MG tablet; Take 1 tablet by mouth every 8 (eight) hours as needed for severe pain.  Dispense: 30 tablet; Refill: 0  3. Tobacco use disorder Smoking cessation was discussed, 3-10 minutes spent on this topic  specifically    Current Outpatient Medications on File Prior to Visit  Medication Sig Dispense Refill  . albuterol (VENTOLIN HFA) 108 (90 Base) MCG/ACT inhaler     . amLODipine (NORVASC) 5 MG tablet Take 1 tablet (5 mg total) by mouth daily. 30 tablet 2  . apixaban (ELIQUIS) 5 MG TABS tablet Take 5 mg by mouth 2 (two) times daily.    [name] atorvastatin (LIPITOR) 80 MG tablet Take 80 mg by mouth at bedtime.     [name] buPROPion (WELLBUTRIN XL) 150 MG 24 hr tablet Take by mouth.    . DULoxetine (CYMBALTA) 60 MG capsule Take 60 mg by mouth daily.    . ferrous sulfate 325 (65 FE) MG tablet Take 325 mg by mouth daily.    . metoprolol tartrate (LOPRESSOR) 50 MG tablet Take 1 tablet (50 mg total) by mouth 2 (two) times daily. 30 tablet 0  . omeprazole (PRILOSEC) 20 MG capsule Take 20 mg by mouth 2 (two) times daily before a meal.    . topiramate (TOPAMAX) 25 MG tablet Take by mouth.    . clotrimazole-betamethasone (LOTRISONE) cream Apply 1 application topically 2 (two) times daily as needed (for dry skin).     [name] diphenhydrAMINE (BENADRYL) 25 MG tablet Take 1 tablet (25 mg total) by mouth every 8 (eight) hours as needed for itching or allergies. (Patient not taking: Reported on 11/14/2019) 20 tablet 0  . lidocaine (LIDODERM) 5 % Place 1 patch onto the skin daily. Remove & Discard patch within 12 hours or as directed by MD (Patient not taking: Reported on 11/14/2019) 30 patch 0  . ondansetron (ZOFRAN) 4 MG tablet Take 1 tablet (4 mg total) by mouth every 8 (eight) hours as needed. (Patient not taking: Reported on 11/14/2019) 20 tablet 0   No current facility-administered medications on file prior to visit.    There are no  Patient Instructions on file for this visit. No follow-ups on file.   Kris Hartmann, NP  This note was completed with Sales executive.  Any errors are purely unintentional.

## 2019-11-14 NOTE — Progress Notes (Unsigned)
complete

## 2019-11-16 ENCOUNTER — Other Ambulatory Visit (INDEPENDENT_AMBULATORY_CARE_PROVIDER_SITE_OTHER): Payer: Self-pay | Admitting: Nurse Practitioner

## 2019-11-16 ENCOUNTER — Telehealth: Payer: Self-pay | Admitting: Unknown Physician Specialty

## 2019-11-16 NOTE — Telephone Encounter (Signed)
Discussed with patient about Covid symptoms and the use of bamlanivimab, a monoclonal antibody infusion for those with mild to moderate Covid symptoms and at a high risk of hospitalization.  Pt is not qualified for this infusion as she is asymptomatic

## 2019-11-17 ENCOUNTER — Encounter
Admission: RE | Disposition: A | Discharge status: Home Health | Payer: Self-pay | Source: Home / Self Care | Attending: Vascular Surgery

## 2019-11-17 ENCOUNTER — Encounter: Payer: Self-pay | Admitting: Vascular Surgery

## 2019-11-17 ENCOUNTER — Inpatient Hospital Stay
Admission: RE | Admit: 2019-11-17 | Discharge: 2019-11-21 | DRG: 271 | Disposition: A | Discharge status: Home Health | Payer: Medicare Other | Attending: Vascular Surgery | Admitting: Vascular Surgery

## 2019-11-17 ENCOUNTER — Other Ambulatory Visit: Payer: Self-pay

## 2019-11-17 DIAGNOSIS — I70222 Atherosclerosis of native arteries of extremities with rest pain, left leg: Principal | ICD-10-CM | POA: Diagnosis present

## 2019-11-17 DIAGNOSIS — Z6841 Body Mass Index (BMI) 40.0 and over, adult: Secondary | ICD-10-CM

## 2019-11-17 DIAGNOSIS — F339 Major depressive disorder, recurrent, unspecified: Secondary | ICD-10-CM | POA: Diagnosis present

## 2019-11-17 DIAGNOSIS — I998 Other disorder of circulatory system: Secondary | ICD-10-CM

## 2019-11-17 DIAGNOSIS — I70229 Atherosclerosis of native arteries of extremities with rest pain, unspecified extremity: Secondary | ICD-10-CM

## 2019-11-17 DIAGNOSIS — K5904 Chronic idiopathic constipation: Secondary | ICD-10-CM | POA: Diagnosis present

## 2019-11-17 DIAGNOSIS — M1991 Primary osteoarthritis, unspecified site: Secondary | ICD-10-CM | POA: Diagnosis present

## 2019-11-17 DIAGNOSIS — I10 Essential (primary) hypertension: Secondary | ICD-10-CM | POA: Diagnosis present

## 2019-11-17 DIAGNOSIS — I708 Atherosclerosis of other arteries: Secondary | ICD-10-CM | POA: Diagnosis present

## 2019-11-17 DIAGNOSIS — I700 Atherosclerosis of aorta: Secondary | ICD-10-CM | POA: Diagnosis present

## 2019-11-17 DIAGNOSIS — Z85828 Personal history of other malignant neoplasm of skin: Secondary | ICD-10-CM

## 2019-11-17 DIAGNOSIS — K219 Gastro-esophageal reflux disease without esophagitis: Secondary | ICD-10-CM | POA: Diagnosis present

## 2019-11-17 DIAGNOSIS — Z79899 Other long term (current) drug therapy: Secondary | ICD-10-CM

## 2019-11-17 DIAGNOSIS — E785 Hyperlipidemia, unspecified: Secondary | ICD-10-CM | POA: Diagnosis present

## 2019-11-17 DIAGNOSIS — Z87442 Personal history of urinary calculi: Secondary | ICD-10-CM

## 2019-11-17 DIAGNOSIS — M79672 Pain in left foot: Secondary | ICD-10-CM | POA: Diagnosis present

## 2019-11-17 DIAGNOSIS — K551 Chronic vascular disorders of intestine: Secondary | ICD-10-CM | POA: Diagnosis present

## 2019-11-17 HISTORY — PX: LOWER EXTREMITY ANGIOGRAPHY: CATH118251

## 2019-11-17 LAB — CREATININE, SERUM
Creatinine, Ser: 0.75 mg/dL (ref 0.44–1.00)
GFR calc Af Amer: >60 mL/min (ref >60)
GFR calc non Af Amer: >60 mL/min (ref >60)

## 2019-11-17 LAB — BUN: BUN: 17 mg/dL (ref 6–20)

## 2019-11-17 SURGERY — LOWER EXTREMITY ANGIOGRAPHY
Anesthesia: Moderate Sedation | Laterality: Left

## 2019-11-17 MED ORDER — MIDAZOLAM HCL 2 MG/2ML IJ SOLN: 1.0000 mg | Freq: Once | INTRAMUSCULAR | Status: DC

## 2019-11-17 MED ORDER — CEFAZOLIN SODIUM-DEXTROSE 2-4 GM/100ML-% IV SOLN
2.0000 g | Freq: Once | INTRAVENOUS | Status: AC
  Administered 2019-11-17: 17:00:00 2 g via INTRAVENOUS
  Filled 2019-11-17: qty 100

## 2019-11-17 MED ORDER — MORPHINE SULFATE (PF) 4 MG/ML IV SOLN
2.0000 mg | INTRAVENOUS | Status: DC | PRN
  Administered 2019-11-17 – 2019-11-18 (×3): 2 mg via INTRAVENOUS
  Filled 2019-11-17 (×3): qty 1

## 2019-11-17 MED ORDER — HEPARIN SODIUM (PORCINE) 5000 UNIT/ML IJ SOLN: 5000.0000 [IU] | Freq: Three times a day (TID) | INTRAMUSCULAR | Status: DC

## 2019-11-17 MED ORDER — ASPIRIN EC 81 MG PO TBEC
81.0000 mg | DELAYED_RELEASE_TABLET | Freq: Every day | ORAL | Status: DC
  Administered 2019-11-18 – 2019-11-21 (×4): 81 mg via ORAL
  Filled 2019-11-17 (×4): qty 1

## 2019-11-17 MED ORDER — ASPIRIN EC 81 MG PO TBEC: 81.0000 mg | DELAYED_RELEASE_TABLET | Freq: Every day | ORAL | Status: DC

## 2019-11-17 MED ORDER — APIXABAN 5 MG PO TABS
5.0000 mg | ORAL_TABLET | Freq: Two times a day (BID) | ORAL | Status: DC
  Administered 2019-11-18 – 2019-11-21 (×7): 5 mg via ORAL
  Filled 2019-11-17 (×7): qty 1

## 2019-11-17 MED ORDER — FENTANYL CITRATE (PF) 100 MCG/2ML IJ SOLN: 50.0000 ug | Freq: Once | INTRAMUSCULAR | Status: DC

## 2019-11-17 MED ORDER — ONDANSETRON HCL 4 MG/2ML IJ SOLN: 4.0000 mg | Freq: Four times a day (QID) | INTRAMUSCULAR | Status: DC | PRN

## 2019-11-17 MED ORDER — DIPHENHYDRAMINE HCL 50 MG/ML IJ SOLN: 50.0000 mg | Freq: Once | INTRAMUSCULAR | Status: DC | PRN

## 2019-11-17 MED ORDER — METHYLPREDNISOLONE SODIUM SUCC 125 MG IJ SOLR: 125.0000 mg | Freq: Once | INTRAMUSCULAR | Status: DC | PRN

## 2019-11-17 MED ORDER — HEPARIN SODIUM (PORCINE) 1000 UNIT/ML IJ SOLN
INTRAMUSCULAR | Status: AC
  Filled 2019-11-17: qty 1

## 2019-11-17 MED ORDER — HYDROMORPHONE HCL 1 MG/ML IJ SOLN: 1.0000 mg | Freq: Once | INTRAMUSCULAR | Status: DC | PRN

## 2019-11-17 MED ORDER — ATORVASTATIN CALCIUM 80 MG PO TABS
80.0000 mg | ORAL_TABLET | Freq: Every day | ORAL | Status: DC
  Administered 2019-11-17 – 2019-11-20 (×4): 80 mg via ORAL
  Filled 2019-11-17 (×4): qty 4
  Filled 2019-11-17 (×5): qty 1

## 2019-11-17 MED ORDER — TIROFIBAN HCL IN NACL 5-0.9 MG/100ML-% IV SOLN
0.1500 ug/kg/min | INTRAVENOUS | Status: DC
  Administered 2019-11-17: 0.15 ug/kg/min via INTRAVENOUS
  Filled 2019-11-17 (×5): qty 100

## 2019-11-17 MED ORDER — ACETAMINOPHEN 325 MG PO TABS
650.0000 mg | ORAL_TABLET | Freq: Four times a day (QID) | ORAL | Status: DC | PRN
  Administered 2019-11-21: 04:00:00 650 mg via ORAL
  Filled 2019-11-17 (×2): qty 2

## 2019-11-17 MED ORDER — DULOXETINE HCL 30 MG PO CPEP
60.0000 mg | ORAL_CAPSULE | Freq: Every day | ORAL | Status: DC
  Administered 2019-11-18 – 2019-11-21 (×4): 60 mg via ORAL
  Filled 2019-11-17 (×5): qty 2

## 2019-11-17 MED ORDER — FENTANYL CITRATE (PF) 100 MCG/2ML IJ SOLN
INTRAMUSCULAR | Status: DC | PRN
  Administered 2019-11-17 (×2): 50 ug via INTRAVENOUS
  Administered 2019-11-17: 25 ug via INTRAVENOUS
  Administered 2019-11-17: 50 ug via INTRAVENOUS
  Administered 2019-11-17 (×3): 25 ug via INTRAVENOUS

## 2019-11-17 MED ORDER — TIROFIBAN (AGGRASTAT) BOLUS VIA INFUSION
25.0000 ug/kg | Freq: Once | INTRAVENOUS | Status: AC
  Filled 2019-11-17: qty 56

## 2019-11-17 MED ORDER — MIDAZOLAM HCL 2 MG/2ML IJ SOLN
INTRAMUSCULAR | Status: AC
  Filled 2019-11-17: qty 2

## 2019-11-17 MED ORDER — TOPIRAMATE 25 MG PO TABS
25.0000 mg | ORAL_TABLET | Freq: Every day | ORAL | Status: DC
  Administered 2019-11-18 – 2019-11-21 (×4): 25 mg via ORAL
  Filled 2019-11-17 (×5): qty 1

## 2019-11-17 MED ORDER — BUPROPION HCL ER (XL) 150 MG PO TB24
150.0000 mg | ORAL_TABLET | Freq: Every day | ORAL | Status: DC
  Administered 2019-11-18 – 2019-11-21 (×4): 150 mg via ORAL
  Filled 2019-11-17 (×4): qty 1

## 2019-11-17 MED ORDER — MIDAZOLAM HCL 5 MG/5ML IJ SOLN
INTRAMUSCULAR | Status: AC
  Filled 2019-11-17: qty 5

## 2019-11-17 MED ORDER — FENTANYL CITRATE (PF) 100 MCG/2ML IJ SOLN
INTRAMUSCULAR | Status: AC
  Filled 2019-11-17: qty 4

## 2019-11-17 MED ORDER — FAMOTIDINE 20 MG PO TABS: 40.0000 mg | ORAL_TABLET | Freq: Once | ORAL | Status: DC | PRN

## 2019-11-17 MED ORDER — HEPARIN SODIUM (PORCINE) 1000 UNIT/ML IJ SOLN
INTRAMUSCULAR | Status: DC | PRN
  Administered 2019-11-17: 5000 [IU] via INTRAVENOUS

## 2019-11-17 MED ORDER — NITROGLYCERIN 1 MG/10 ML FOR IR/CATH LAB
INTRA_ARTERIAL | Status: DC | PRN
  Administered 2019-11-17 (×2): 250 ug

## 2019-11-17 MED ORDER — OXYCODONE HCL 5 MG PO TABS
5.0000 mg | ORAL_TABLET | ORAL | Status: DC | PRN
  Administered 2019-11-20: 5 mg via ORAL
  Filled 2019-11-17: qty 1

## 2019-11-17 MED ORDER — MIDAZOLAM HCL 2 MG/2ML IJ SOLN
INTRAMUSCULAR | Status: DC | PRN
  Administered 2019-11-17 (×2): 0.5 mg via INTRAVENOUS
  Administered 2019-11-17: 1 mg via INTRAVENOUS
  Administered 2019-11-17: 0.5 mg via INTRAVENOUS
  Administered 2019-11-17: 1 mg via INTRAVENOUS
  Administered 2019-11-17: 0.5 mg via INTRAVENOUS
  Administered 2019-11-17: 2 mg via INTRAVENOUS

## 2019-11-17 MED ORDER — MIDAZOLAM HCL 2 MG/ML PO SYRP
8.0000 mg | ORAL_SOLUTION | Freq: Once | ORAL | Status: DC | PRN
  Filled 2019-11-17: qty 4

## 2019-11-17 MED ORDER — OXYCODONE HCL 5 MG PO TABS
10.0000 mg | ORAL_TABLET | ORAL | Status: DC | PRN
  Administered 2019-11-17 – 2019-11-21 (×10): 10 mg via ORAL
  Filled 2019-11-17 (×10): qty 2

## 2019-11-17 MED ORDER — FENTANYL CITRATE (PF) 100 MCG/2ML IJ SOLN
INTRAMUSCULAR | Status: AC
  Filled 2019-11-17: qty 2

## 2019-11-17 MED ORDER — KETOROLAC TROMETHAMINE 15 MG/ML IJ SOLN
15.0000 mg | Freq: Four times a day (QID) | INTRAMUSCULAR | Status: DC
  Administered 2019-11-18: 15 mg via INTRAVENOUS
  Filled 2019-11-17: qty 1

## 2019-11-17 MED ORDER — SODIUM CHLORIDE 0.9 % IV SOLN: INTRAVENOUS | Status: DC

## 2019-11-17 MED ORDER — KETOROLAC TROMETHAMINE 15 MG/ML IJ SOLN: 15.0000 mg | Freq: Four times a day (QID) | INTRAMUSCULAR | Status: DC

## 2019-11-17 MED ORDER — ACETAMINOPHEN 650 MG RE SUPP
650.0000 mg | Freq: Four times a day (QID) | RECTAL | Status: DC | PRN
  Filled 2019-11-17: qty 1

## 2019-11-17 MED ORDER — AMLODIPINE BESYLATE 5 MG PO TABS
5.0000 mg | ORAL_TABLET | Freq: Every day | ORAL | Status: DC
  Administered 2019-11-18 – 2019-11-21 (×4): 5 mg via ORAL
  Filled 2019-11-17 (×4): qty 1

## 2019-11-17 MED ORDER — ALBUTEROL SULFATE HFA 108 (90 BASE) MCG/ACT IN AERS
1.0000 | INHALATION_SPRAY | Freq: Four times a day (QID) | RESPIRATORY_TRACT | Status: DC | PRN
  Filled 2019-11-17: qty 6.7

## 2019-11-17 MED ORDER — METOPROLOL TARTRATE 50 MG PO TABS
50.0000 mg | ORAL_TABLET | Freq: Two times a day (BID) | ORAL | Status: DC
  Administered 2019-11-17 – 2019-11-21 (×7): 50 mg via ORAL
  Filled 2019-11-17 (×7): qty 1

## 2019-11-17 MED ORDER — TIROFIBAN HCL IV 12.5 MG/250 ML
INTRAVENOUS | Status: AC
  Administered 2019-11-17: 19:00:00 2767.5 ug via INTRAVENOUS
  Filled 2019-11-17: qty 250

## 2019-11-17 MED ORDER — PANTOPRAZOLE SODIUM 40 MG PO TBEC
40.0000 mg | DELAYED_RELEASE_TABLET | Freq: Every day | ORAL | Status: DC
  Administered 2019-11-18 – 2019-11-21 (×4): 40 mg via ORAL
  Filled 2019-11-17 (×4): qty 1

## 2019-11-17 SURGICAL SUPPLY — 16 items
BALLN ULTRVRSE 2.5X150X150 (BALLOONS) ×2
BALLOON ULTRVRSE 2.5X150X150 (BALLOONS) IMPLANT
CANISTER PENUMBRA ENGINE (MISCELLANEOUS) ×1 IMPLANT
CATH INDIGO CAT RX KIT (CATHETERS) ×1 IMPLANT
CATH INDIGO CAT6 KIT (CATHETERS) ×1 IMPLANT
CATH INDIGO SEP 6 (CATHETERS) ×1 IMPLANT
CATH PIG 70CM (CATHETERS) ×1 IMPLANT
DEVICE PRESTO INFLATION (MISCELLANEOUS) ×1 IMPLANT
DEVICE STARCLOSE SE CLOSURE (Vascular Products) ×1 IMPLANT
GLIDEWIRE ADV .014X300CM (WIRE) ×1 IMPLANT
GUIDEWIRE PFTE-COATED .018X300 (WIRE) ×1 IMPLANT
PACK ANGIOGRAPHY (CUSTOM PROCEDURE TRAY) ×2 IMPLANT
SHEATH BRITE TIP 5FRX11 (SHEATH) ×1 IMPLANT
SHEATH GUIDING CAROTID 6FRX90 (SHEATH) ×1 IMPLANT
WIRE J 3MM .035X145CM (WIRE) ×1 IMPLANT
WIRE MAGIC TORQUE 260C (WIRE) ×1 IMPLANT

## 2019-11-17 NOTE — H&P (Signed)
[facility] History & Physical Update  The patient was interviewed and re-examined.  The patient's previous History and Physical has been reviewed and is unchanged.  There is no change in the plan of care. We plan to proceed with the scheduled procedure.  [name], MD  11/17/2019, 5:05 PM

## 2019-11-17 NOTE — Op Note (Signed)
[facility]  Percutaneous Study/Intervention Procedural Note   Date of Surgery: 11/17/2019  Surgeon(s):Charitie Hinote    Assistants:none  Pre-operative Diagnosis: PAD with rest pain left foot with acute to subacute ischemia  Post-operative diagnosis:  Same  Procedure(s) Performed:             1.  Ultrasound guidance for vascular access right femoral artery             2.  Catheter placement into left SFA from right femoral approach             3.  Aortogram and selective left lower extremity angiogram including selective imaging of the left anterior tibial and left posterior tibial arteries             4.  Percutaneous transluminal angioplasty of left anterior tibial artery with 2.5 mm diameter by 15 cm length angioplasty balloon             5.   Percutaneous transluminal angioplasty of the left posterior tibial artery with 2.5 mm diameter by 15 cm length angioplasty balloon  6.  Mechanical thrombectomy to both the left anterior tibial and left posterior tibial arteries with both the penumbra cat 6 device and the penumbra CAT Rx device in both vessels             7.  StarClose closure device right femoral artery  EBL: 50 cc  Contrast: 85 cc  Fluoro Time: 17.6 minutes  Moderate Conscious Sedation Time: approximately 55 minutes using 6 mg of Versed and 250 mcg of Fentanyl              Indications:  Patient is a 59 y.o.female with an ischemic left foot over the past week or so. The patient has noninvasive study showing flat digital waveforms and essentially no flow in the toes. The patient is brought in for angiography for further evaluation and potential treatment.  Due to the limb threatening nature of the situation, angiogram was performed for attempted limb salvage. The patient is aware that if the procedure fails, amputation would be expected.  The patient also understands that even with successful revascularization, amputation may still be required due to the  severity of the situation.  Risks and benefits are discussed and informed consent is obtained.   Procedure:  The patient was identified and appropriate procedural time out was performed.  The patient was then placed supine on the table and prepped and draped in the usual sterile fashion. Moderate conscious sedation was administered during a face to face encounter with the patient throughout the procedure with my supervision of the RN administering medicines and monitoring the patient's vital signs, pulse oximetry, telemetry and mental status throughout from the start of the procedure until the patient was taken to the recovery room. Ultrasound was used to evaluate the right common femoral artery.  It was patent .  A digital ultrasound image was acquired.  A Seldinger needle was used to access the right common femoral artery under direct ultrasound guidance and a permanent image was performed.  A 0.035 J wire was advanced without resistance and a 5Fr sheath was placed.  Pigtail catheter was placed into the aorta and an AP aortogram was performed. This demonstrated normal renal arteries, aorta and iliac arteries without significant stenosis.  The previous stent in the SMA appeared to have good flow although in an AP projection was difficult to discern entirely. I then crossed the aortic bifurcation and advanced to  the left femoral head and then down into the left SFA to help opacify the distal vessels. Selective left lower extremity angiogram was then performed. This demonstrated completely normal flow in the common femoral artery, superficial femoral artery and popliteal arteries.  There was a typical tibial trifurcation.  The anterior tibial and posterior tibial arteries were the runoff vessels distally and were basically normal until the distal portions of the vessel about 5 to 8 cm above the ankle.  Essentially no flow was seen in the foot on initial imaging. It was felt that it was in the patient's best  interest to proceed with intervention after these images to avoid a second procedure and a larger amount of contrast and fluoroscopy based off of the findings from the initial angiogram. The patient was systemically heparinized and a 6 French 90 cm sheath was then placed over the torque wire. I then used a Kumpe catheter and the 0.014 advantage wire to get into the anterior tibial artery and perform selective imaging of the anterior tibial artery.  This confirmed the abrupt occlusion several centimeters above the ankle with essentially no flow in the foot.  I was able to get a wire all the way to the distal portions of the metatarsal bones and the anterior tibial artery.  I started with the penumbra cat 6 device and was able to get this down just into the proximal foot but this was the entire length of the device.  Thrombectomy was performed in this location.  Initially, there was basically no improvement.  I then turned my attention to the posterior tibial artery.  Selective imaging was performed through the catheter in the posterior tibial artery confirming the abrupt occlusion several centimeters above the ankle and the posterior tibial artery again with essentially no flow in the foot.  I then used a 0.014 advantage wire and got this all the way to the mid to distal foot and the distal metatarsal bone area.  I started with the penumbra cat 6 device but once again this only got into the proximal foot.  A large plug of thrombus was removed with the penumbra cat 6 device performing thrombectomy in the posterior tibial artery.  I then used a 2.5 mm diameter by 15 cm length angioplasty balloon to inflate from the metatarsal up to the posterior tibial artery just above the ankle.  This was taken to 6 atm for 1 minute.  Completion imaging showed markedly improved flow in the posterior tibial artery with less than 30% residual stenosis although there was still occlusion in the midfoot.  The penumbra CAT Rx device was  taken out into the posterior tibial artery all the way to the metatarsal bones as it was slightly longer.  After some intra-arterial nitroglycerin was given for vasospasm, imaging showed fairly good flow through the posterior tibial artery with good collaterals filling most of the foot.  I then turned my attention back to the anterior tibial artery.  The wire was again taken out to the distal dorsalis pedis artery at the metatarsal bones.  2 inflations with the 2.5 mm diameter by 15 cm length angioplasty balloon up to 6 and 8 atm were done taking this from the midfoot up about 6 to 8 cm above the ankle.  Completion imaging showed better collateral flow but residual occlusion of the dorsalis pedis artery just below the ankle.  The penumbra CAT Rx device was again brought onto the field and several passes were made to try to  clear thrombus from the distal anterior tibial artery and dorsalis pedis artery with mechanical thrombectomy.  There is now some collateral flow through the anterior tibial artery with the posterior tibial artery having pretty good runoff into the foot.  At this point I felt her best option was to place her on Aggrastat overnight and then continue anticoagulation for the small vessel disease and there was not much further I can do today. I elected to terminate the procedure. The sheath was removed and StarClose closure device was deployed in the right femoral artery with excellent hemostatic result. The patient was taken to the recovery room in stable condition having tolerated the procedure well.  Findings:               Aortogram:  Normal renal arteries, aorta and iliac arteries without significant stenosis.  The previous stent in the SMA appeared to have good flow although in an AP projection was difficult to discern entirely.             Left lower Extremity:  This demonstrated completely normal flow in the common femoral artery, superficial femoral artery and popliteal arteries.  There  was a typical tibial trifurcation.  The anterior tibial and posterior tibial arteries were the runoff vessels distally and were basically normal until the distal portions of the vessel about 5 to 8 cm above the ankle.  Essentially no flow was seen in the foot on initial imaging   Disposition: Patient was taken to the recovery room in stable condition having tolerated the procedure well.  Complications: None  [name] 11/17/2019 6:36 PM   This note was created with Dragon Medical transcription system. Any errors in dictation are purely unintentional.

## 2019-11-17 NOTE — H&P (Addendum)
  [facility] Admission History & Physical  MRN : [id number]  Carla [name] is a 59 y.o. ([birth year]) female who presents with chief complaint of "scheduled left lower extremity angiogram".  The patient was last seen on 11/14/19 in our outpatient setting. Please refer to that note.  There is no change in diagnosis, symptoms or physical exam.  Discussed with Dr. Mayme Genta, PA-C  11/17/2019 1:33 PM

## 2019-11-18 ENCOUNTER — Encounter: Payer: Self-pay | Admitting: Vascular Surgery

## 2019-11-18 ENCOUNTER — Inpatient Hospital Stay: Payer: Medicare Other

## 2019-11-18 DIAGNOSIS — F339 Major depressive disorder, recurrent, unspecified: Secondary | ICD-10-CM | POA: Diagnosis present

## 2019-11-18 DIAGNOSIS — M1991 Primary osteoarthritis, unspecified site: Secondary | ICD-10-CM | POA: Diagnosis present

## 2019-11-18 DIAGNOSIS — I70229 Atherosclerosis of native arteries of extremities with rest pain, unspecified extremity: Secondary | ICD-10-CM

## 2019-11-18 DIAGNOSIS — Z87442 Personal history of urinary calculi: Secondary | ICD-10-CM | POA: Diagnosis not present

## 2019-11-18 DIAGNOSIS — Z79899 Other long term (current) drug therapy: Secondary | ICD-10-CM | POA: Diagnosis not present

## 2019-11-18 DIAGNOSIS — K5904 Chronic idiopathic constipation: Secondary | ICD-10-CM | POA: Diagnosis present

## 2019-11-18 DIAGNOSIS — K219 Gastro-esophageal reflux disease without esophagitis: Secondary | ICD-10-CM | POA: Diagnosis present

## 2019-11-18 DIAGNOSIS — I708 Atherosclerosis of other arteries: Secondary | ICD-10-CM | POA: Diagnosis present

## 2019-11-18 DIAGNOSIS — I998 Other disorder of circulatory system: Secondary | ICD-10-CM

## 2019-11-18 DIAGNOSIS — I700 Atherosclerosis of aorta: Secondary | ICD-10-CM | POA: Diagnosis present

## 2019-11-18 DIAGNOSIS — Z6841 Body Mass Index (BMI) 40.0 and over, adult: Secondary | ICD-10-CM | POA: Diagnosis not present

## 2019-11-18 DIAGNOSIS — I10 Essential (primary) hypertension: Secondary | ICD-10-CM | POA: Diagnosis present

## 2019-11-18 DIAGNOSIS — K551 Chronic vascular disorders of intestine: Secondary | ICD-10-CM | POA: Diagnosis present

## 2019-11-18 DIAGNOSIS — I739 Peripheral vascular disease, unspecified: Secondary | ICD-10-CM | POA: Diagnosis present

## 2019-11-18 DIAGNOSIS — M79672 Pain in left foot: Secondary | ICD-10-CM | POA: Diagnosis present

## 2019-11-18 DIAGNOSIS — I70221 Atherosclerosis of native arteries of extremities with rest pain, right leg: Secondary | ICD-10-CM | POA: Diagnosis not present

## 2019-11-18 DIAGNOSIS — I70222 Atherosclerosis of native arteries of extremities with rest pain, left leg: Secondary | ICD-10-CM | POA: Diagnosis present

## 2019-11-18 DIAGNOSIS — Z85828 Personal history of other malignant neoplasm of skin: Secondary | ICD-10-CM | POA: Diagnosis not present

## 2019-11-18 DIAGNOSIS — E785 Hyperlipidemia, unspecified: Secondary | ICD-10-CM | POA: Diagnosis present

## 2019-11-18 LAB — BASIC METABOLIC PANEL WITH GFR
Anion gap: 5 (ref 5–15)
BUN: 18 mg/dL (ref 6–20)
CO2: 25 mmol/L (ref 22–32)
Calcium: 7.9 mg/dL — ABNORMAL LOW (ref 8.9–10.3)
Chloride: 109 mmol/L (ref 98–111)
Creatinine, Ser: 0.83 mg/dL (ref 0.44–1.00)
GFR calc Af Amer: >60 mL/min
GFR calc non Af Amer: >60 mL/min
Glucose, Bld: 133 mg/dL — ABNORMAL HIGH (ref 70–99)
Potassium: 4.3 mmol/L (ref 3.5–5.1)
Sodium: 139 mmol/L (ref 135–145)

## 2019-11-18 LAB — CBC
HCT: 39.9 % (ref 36.0–46.0)
Hemoglobin: 12.1 g/dL (ref 12.0–15.0)
MCH: 29.5 pg (ref 26.0–34.0)
MCHC: 30.3 g/dL (ref 30.0–36.0)
MCV: 97.3 fL (ref 80.0–100.0)
Platelets: 212 10*3/uL (ref 150–400)
RBC: 4.1 MIL/uL (ref 3.87–5.11)
RDW: 14.3 % (ref 11.5–15.5)
WBC: 4.5 10*3/uL (ref 4.0–10.5)
nRBC: 0 % (ref 0.0–0.2)

## 2019-11-18 LAB — MAGNESIUM: Magnesium: 2.1 mg/dL (ref 1.7–2.4)

## 2019-11-18 MED ORDER — KETOROLAC TROMETHAMINE 30 MG/ML IJ SOLN
30.0000 mg | Freq: Four times a day (QID) | INTRAMUSCULAR | Status: AC
  Administered 2019-11-18 – 2019-11-19 (×4): 30 mg via INTRAVENOUS
  Filled 2019-11-18 (×4): qty 1

## 2019-11-18 MED ORDER — IOHEXOL 350 MG/ML SOLN
100.0000 mL | Freq: Once | INTRAVENOUS | Status: AC | PRN
  Administered 2019-11-18: 100 mL via INTRAVENOUS

## 2019-11-18 MED ORDER — SODIUM CHLORIDE 0.9 % IV SOLN: INTRAVENOUS | Status: DC

## 2019-11-18 MED ORDER — GABAPENTIN 300 MG PO CAPS
300.0000 mg | ORAL_CAPSULE | ORAL | Status: AC
  Administered 2019-11-18: 14:00:00 300 mg via ORAL
  Filled 2019-11-18: qty 1

## 2019-11-18 MED ORDER — GABAPENTIN 100 MG PO CAPS
100.0000 mg | ORAL_CAPSULE | Freq: Two times a day (BID) | ORAL | Status: DC
  Administered 2019-11-19 – 2019-11-21 (×5): 100 mg via ORAL
  Filled 2019-11-18 (×5): qty 1

## 2019-11-18 NOTE — Progress Notes (Addendum)
[facility] Daily Progress Note   Subjective: 1 Day Post-Op: 1. Ultrasound guidance for vascular access right femoral artery 2. Catheter placement into left SFA from right femoral approach 3. Aortogram and selective left lower extremity angiogram including selective imaging of the left anterior tibial and left posterior tibial arteries 4. Percutaneous transluminal angioplasty of left anterior tibial artery with 2.5 mm diameter by 15 cm length angioplasty balloon 5.  Percutaneous transluminal angioplasty of the left posterior tibial artery with 2.5 mm diameter by 15 cm length angioplasty balloon             6.  Mechanical thrombectomy to both the left anterior tibial and left posterior tibial arteries with both the penumbra cat 6 device and the penumbra CAT Rx device in both vessels 7. StarClose closure device right femoral artery  Patient with hematoma formation to right groin / abdomen overnight.  Aggrastat stopped.  Stat CTA abdomen / pelvis ordered.  Objective: Vitals:   11/18/19 0402 11/18/19 0409 11/18/19 1029 11/18/19 1249  BP:  (!) 116/59 114/80 116/60  Pulse:  73  66  Resp:  20  16  Temp:  97.6 F (36.4 C)  98.9 F (37.2 C)  TempSrc:  Oral  Oral  SpO2:  96%  97%  Weight: 113.5 kg     Height:        Intake/Output Summary (Last 24 hours) at 11/18/2019 1554 Last data filed at 11/18/2019 1500 Gross per 24 hour  Intake 56.41 ml  Output 250 ml  Net -193.59 ml   Physical Exam: A&Ox3, NAD CV: RRR Pulmonary: CTA Bilaterally Abdomen:   Right Side: Mild ecchymosis noted tracking across right side of abdomen.  Hematoma noted.  Rest of abdomen is soft nondistended nontender. Vascular:  Left lower extremity: Thigh soft.  Calf soft.  Extremities warm distally to toes.  Motor/sensory intact.  Right lower extremity: Soft.  Calf soft.  Extremities warm distally to toes.  Motor/sensory  intact.   Laboratory: CBC    Component Value Date/Time   WBC 4.5 11/18/2019 0450   HGB 12.1 11/18/2019 0450   HGB 13.4 02/12/2015 1137   HCT 39.9 11/18/2019 0450   HCT 40.9 02/12/2015 1137   PLT 212 11/18/2019 0450   PLT 272 02/12/2015 1137   BMET    Component Value Date/Time   NA 139 11/18/2019 0450   NA 140 02/12/2015 1137   K 4.3 11/18/2019 0450   K 2.9 (L) 02/12/2015 1137   CL 109 11/18/2019 0450   CL 107 02/12/2015 1137   CO2 25 11/18/2019 0450   CO2 27 02/12/2015 1137   GLUCOSE 133 (H) 11/18/2019 0450   GLUCOSE 120 (H) 02/12/2015 1137   BUN 18 11/18/2019 0450   BUN 15 02/12/2015 1137   CREATININE 0.83 11/18/2019 0450   CREATININE 0.78 02/12/2015 1137   CALCIUM 7.9 (L) 11/18/2019 0450   CALCIUM 9.0 02/12/2015 1137   GFRNONAA >60 11/18/2019 0450   GFRNONAA >60 02/12/2015 1137   GFRAA >60 11/18/2019 0450   GFRAA >60 02/12/2015 1137   Assessment/Planning: The patient is a 59 year old female with 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  Will order a STAT CT abdomen and pelvis to assess area.  2) atherosclerotic disease to lower extremities: Once active bleeding is ruled out patient will resume Eliquis and aspirin.  Discussed with Dr. Lucky Cowboy  Addendum @ 3:30pm Reviewed CTA images  with Dr. Delana Meyer.  No hematoma appreciated however the patient does have a large hernia.  Will consult general surgery.  Marcelle Overlie PA-C 11/18/2019 3:54 PM

## 2019-11-18 NOTE — Progress Notes (Addendum)
    BRIEF OVERNIGHT PROGRESS REPORT  Notified by primary RN of right above groin hematoma/knot with ecchymosis around the inguinal area. Also noted slightly larger than usual inguinal hernia with bruising.  Given concerns for possible femoral artery pseudoaneurysm/extension of hematoma,  findings were discussed  with vascular surgeon Dr. Lucky Cowboy who recommends removal of Star close closure device with manual pressure compression and stopping tirofiban (Aggrastat).  They will follow with patient in the a.m. for further evaluation.  Vital signs stable patient remains asymptomatic.     Rufina Falco, DNP, CCRN, FNP-C [facility] Nurse Practitioner Between 7pm to 7am - Pager [phone]  After 7am go to [email or web address] - password:[email or web address]  [facility] [employer]  [phone]

## 2019-11-19 ENCOUNTER — Encounter
Admission: RE | Disposition: A | Discharge status: Home Health | Payer: Self-pay | Source: Home / Self Care | Attending: Vascular Surgery

## 2019-11-19 ENCOUNTER — Other Ambulatory Visit (INDEPENDENT_AMBULATORY_CARE_PROVIDER_SITE_OTHER): Payer: Self-pay | Admitting: Vascular Surgery

## 2019-11-19 DIAGNOSIS — I70221 Atherosclerosis of native arteries of extremities with rest pain, right leg: Secondary | ICD-10-CM

## 2019-11-19 DIAGNOSIS — I70222 Atherosclerosis of native arteries of extremities with rest pain, left leg: Secondary | ICD-10-CM

## 2019-11-19 HISTORY — PX: LOWER EXTREMITY INTERVENTION: CATH118252

## 2019-11-19 LAB — BASIC METABOLIC PANEL
Anion gap: 9 (ref 5–15)
BUN: 20 mg/dL (ref 6–20)
CO2: 25 mmol/L (ref 22–32)
Calcium: 8.3 mg/dL — ABNORMAL LOW (ref 8.9–10.3)
Chloride: 110 mmol/L (ref 98–111)
Creatinine, Ser: 0.97 mg/dL (ref 0.44–1.00)
GFR calc Af Amer: >60 mL/min (ref >60)
GFR calc non Af Amer: >60 mL/min (ref >60)
Glucose, Bld: 126 mg/dL — ABNORMAL HIGH (ref 70–99)
Potassium: 4.3 mmol/L (ref 3.5–5.1)
Sodium: 144 mmol/L (ref 135–145)

## 2019-11-19 LAB — CBC
HCT: 37.5 % (ref 36.0–46.0)
Hemoglobin: 11.4 g/dL — ABNORMAL LOW (ref 12.0–15.0)
MCH: 30 pg (ref 26.0–34.0)
MCHC: 30.4 g/dL (ref 30.0–36.0)
MCV: 98.7 fL (ref 80.0–100.0)
Platelets: 180 10*3/uL (ref 150–400)
RBC: 3.8 MIL/uL — ABNORMAL LOW (ref 3.87–5.11)
RDW: 14.2 % (ref 11.5–15.5)
WBC: 4.7 10*3/uL (ref 4.0–10.5)
nRBC: 0 % (ref 0.0–0.2)

## 2019-11-19 LAB — HEMOGLOBIN A1C
Hgb A1c MFr Bld: 6.3 % — ABNORMAL HIGH (ref 4.8–5.6)
Mean Plasma Glucose: 134.11 mg/dL

## 2019-11-19 LAB — GLUCOSE, CAPILLARY: Glucose-Capillary: 183 mg/dL — ABNORMAL HIGH (ref 70–99)

## 2019-11-19 LAB — MAGNESIUM: Magnesium: 2.2 mg/dL (ref 1.7–2.4)

## 2019-11-19 SURGERY — LOWER EXTREMITY INTERVENTION
Anesthesia: Moderate Sedation

## 2019-11-19 MED ORDER — HYDROMORPHONE HCL 1 MG/ML IJ SOLN: 1.0000 mg | Freq: Once | INTRAMUSCULAR | Status: DC | PRN

## 2019-11-19 MED ORDER — FENTANYL CITRATE (PF) 100 MCG/2ML IJ SOLN
INTRAMUSCULAR | Status: AC
  Filled 2019-11-19: qty 2

## 2019-11-19 MED ORDER — HYDROMORPHONE HCL 1 MG/ML IJ SOLN: 1.0000 mg | INTRAMUSCULAR | Status: DC | PRN

## 2019-11-19 MED ORDER — MIDAZOLAM HCL 2 MG/2ML IJ SOLN
INTRAMUSCULAR | Status: DC | PRN
  Administered 2019-11-19 (×2): 2 mg via INTRAVENOUS
  Administered 2019-11-19: 1 mg via INTRAVENOUS
  Administered 2019-11-19: 2 mg via INTRAVENOUS

## 2019-11-19 MED ORDER — CEFAZOLIN SODIUM-DEXTROSE 2-4 GM/100ML-% IV SOLN
2.0000 g | Freq: Once | INTRAVENOUS | Status: AC
  Administered 2019-11-19: 2 g via INTRAVENOUS
  Filled 2019-11-19: qty 100

## 2019-11-19 MED ORDER — FAMOTIDINE 20 MG PO TABS: 40.0000 mg | ORAL_TABLET | Freq: Once | ORAL | Status: DC | PRN

## 2019-11-19 MED ORDER — DIPHENHYDRAMINE HCL 50 MG/ML IJ SOLN: 50.0000 mg | Freq: Once | INTRAMUSCULAR | Status: DC | PRN

## 2019-11-19 MED ORDER — MIDAZOLAM HCL 2 MG/2ML IJ SOLN
INTRAMUSCULAR | Status: AC
  Filled 2019-11-19: qty 2

## 2019-11-19 MED ORDER — IODIXANOL 320 MG/ML IV SOLN
INTRAVENOUS | Status: DC | PRN
  Administered 2019-11-19: 105 mL via INTRA_ARTERIAL

## 2019-11-19 MED ORDER — MIDAZOLAM HCL 2 MG/ML PO SYRP
8.0000 mg | ORAL_SOLUTION | Freq: Once | ORAL | Status: DC | PRN
  Filled 2019-11-19: qty 4

## 2019-11-19 MED ORDER — METHYLPREDNISOLONE SODIUM SUCC 125 MG IJ SOLR: 125.0000 mg | Freq: Once | INTRAMUSCULAR | Status: DC | PRN

## 2019-11-19 MED ORDER — MIDAZOLAM HCL 5 MG/5ML IJ SOLN
INTRAMUSCULAR | Status: AC
  Filled 2019-11-19: qty 5

## 2019-11-19 MED ORDER — FENTANYL CITRATE (PF) 100 MCG/2ML IJ SOLN
INTRAMUSCULAR | Status: DC | PRN
  Administered 2019-11-19 (×4): 50 ug via INTRAVENOUS

## 2019-11-19 MED ORDER — HEPARIN SODIUM (PORCINE) 1000 UNIT/ML IJ SOLN
INTRAMUSCULAR | Status: AC
  Filled 2019-11-19: qty 1

## 2019-11-19 MED ORDER — HEPARIN SODIUM (PORCINE) 1000 UNIT/ML IJ SOLN
INTRAMUSCULAR | Status: DC | PRN
  Administered 2019-11-19: 5000 [IU] via INTRAVENOUS

## 2019-11-19 MED ORDER — ONDANSETRON HCL 4 MG/2ML IJ SOLN: 4.0000 mg | Freq: Four times a day (QID) | INTRAMUSCULAR | Status: DC | PRN

## 2019-11-19 MED ORDER — SODIUM CHLORIDE 0.9 % IV SOLN: INTRAVENOUS | Status: DC

## 2019-11-19 SURGICAL SUPPLY — 20 items
BALLN ULTRVRSE 8X40X75C (BALLOONS) ×2
BALLN ULTRVRSE 8X60X75C (BALLOONS) ×2
BALLOON ULTRVRSE 8X40X75C (BALLOONS) IMPLANT
BALLOON ULTRVRSE 8X60X75C (BALLOONS) IMPLANT
CANISTER PENUMBRA ENGINE (MISCELLANEOUS) ×1 IMPLANT
CATH INDIGO D 50CM (CATHETERS) ×1 IMPLANT
CATH PIG 70CM (CATHETERS) ×1 IMPLANT
DEVICE PRESTO INFLATION (MISCELLANEOUS) ×3 IMPLANT
DEVICE STARCLOSE SE CLOSURE (Vascular Products) ×2 IMPLANT
PACK ANGIOGRAPHY (CUSTOM PROCEDURE TRAY) ×1 IMPLANT
SHEATH BRITE TIP 5FRX11 (SHEATH) ×1 IMPLANT
SHEATH BRITE TIP 6FRX11 (SHEATH) ×1 IMPLANT
SHEATH BRITE TIP 8FRX11 (SHEATH) ×1 IMPLANT
STENT LIFESTREAM 7X26X80 (Permanent Stent) ×1 IMPLANT
STENT LIFESTREAM 7X37X80 (Permanent Stent) ×1 IMPLANT
SYR MEDRAD MARK 7 150ML (SYRINGE) ×1 IMPLANT
TOWEL OR 17X26 4PK STRL BLUE (TOWEL DISPOSABLE) ×1 IMPLANT
TUBING CONTRAST HIGH PRESS 72 (TUBING) ×1 IMPLANT
WIRE J 3MM .035X145CM (WIRE) ×1 IMPLANT
WIRE MAGIC TOR.035 180C (WIRE) ×2 IMPLANT

## 2019-11-19 NOTE — H&P (Signed)
[facility] History & Physical Update  The patient was interviewed and re-examined.  The patient's previous History and Physical has been reviewed and is unchanged.  There is no change in the plan of care. We plan to proceed with the scheduled procedure.  [name], MD  11/19/2019, 3:57 PM

## 2019-11-19 NOTE — Op Note (Signed)
[facility]  Percutaneous Study/Intervention Procedural Note   Date of Surgery: 11/19/2019  Surgeon(s):Samanda Buske    Assistants:none  Pre-operative Diagnosis: PAD with rest pain left foot, recent CT scan showing thrombus in the proximal left common iliac artery  Post-operative diagnosis:  Same  Procedure(s) Performed:             1.  Ultrasound guidance for vascular access bilateral femoral arteries             2.  Catheter placement into aorta from bilateral femoral approaches             3.  Aortogram and bilateral iliofemoral arteriograms             4.   Mechanical thrombectomy to the left common and external iliac arteries with the penumbra CAT D device             5.   Kissing balloon expandable stent placement to bilateral common iliac arteries using 7 mm diameter Lifestream's postdilated with an 8 mm balloon  6.  StarClose closure device bilateral femoral arteries  EBL: 10 cc  Contrast: 105 cc  Fluoro Time: 6.3 minutes  Moderate Conscious Sedation Time: approximately 30 minutes using 7 mg of Versed and 200 mcg of Fentanyl              Indications:  Patient is a 59 y.o.female with rest pain of the left foot.  She had previous tibial intervention earlier in the week. The patient has had a CT scan showing thrombus at the origin of the left common iliac artery tracking down.  This was not well seen on her previous imaging likely due to her body habitus and poor image quality, but could have been the source of the embolus to her foot. The patient is brought in for angiography for further evaluation and potential treatment.  Due to the limb threatening nature of the situation, angiogram was performed for attempted limb salvage. The patient is aware that if the procedure fails, amputation would be expected.  The patient also understands that even with successful revascularization, amputation may still be required due to the severity of the situation.  Risks and  benefits are discussed and informed consent is obtained.   Procedure:  The patient was identified and appropriate procedural time out was performed.  The patient was then placed supine on the table and prepped and draped in the usual sterile fashion. Moderate conscious sedation was administered during a face to face encounter with the patient throughout the procedure with my supervision of the RN administering medicines and monitoring the patient's vital signs, pulse oximetry, telemetry and mental status throughout from the start of the procedure until the patient was taken to the recovery room. Ultrasound was used to evaluate the right common femoral artery.  It was patent .  A digital ultrasound image was acquired.  A Seldinger needle was used to access the right common femoral artery under direct ultrasound guidance and a permanent image was performed.  A 0.035 J wire was advanced without resistance and a 5Fr sheath was placed.  Pigtail catheter was placed into the aorta and an AP aortogram was performed. This demonstrated normal renal arteries, the aorta was fairly normal until its bifurcation, and a tail of thrombus in the left common iliac artery starting basically at its origin extending down to just above the hypogastric artery.  There is mild stenosis in the left external iliac artery.  I felt her  best option for treatment would be performing thrombectomy primarily on this thrombus and then covering the area with a covered stent.  A covered stent will be placed on the right to protect the right side as well as this was at the origin of the left common iliac artery.  Ultrasound was used to visualize the left femoral artery which was patent.  It was accessed under direct ultrasound guidance without difficulty with a Seldinger needle and a permanent image was recorded.  An 8 French sheath was placed on the left and a 6 French sheath was then placed on the right.  The patient was systemically heparinized.  I  then used the penumbra CAT D device to the left femoral sheath and perform mechanical thrombectomy both over the wire and without a wire in the left common proximal external iliac arteries.  2 significant chunks of thrombus were removed.  One appeared more fresh and the other appeared to be a little more organized.  After we ensure that the sheath was clear, imaging was performed which showed a lesion in the left common iliac artery which was likely the site of thrombosis.  I then elected to place a covered stents in both common iliac arteries and 7 mm diameter by 26 mm length stent was selected on the right and a 7 mm diameter by 37 mm length stent was selected on the left.  These were deployed simultaneously in a kissing balloon fashion and inflated to 12 atm.  I then upsized to 8 mm balloons and postdilated the stents up to 8 mm bilaterally inflating the 8 mm balloon to 10 atm.  Completion imaging following this showed patent common iliac artery stents bilaterally and mild stenosis in the left external iliac artery that did not appear to be more than 20 to 25%.  No further thrombus was seen.  I elected to terminate the procedure. The sheath was removed and StarClose closure device was deployed in the left femoral artery with excellent hemostatic result.  Similarly, the sheath was removed and StarClose closure device was deployed in the right femoral artery with excellent hemostatic result.  The patient was taken to the recovery room in stable condition having tolerated the procedure well.  Findings:               Aortogram:  This demonstrated normal renal arteries, the aorta was fairly normal until its bifurcation, and a tail of thrombus in the left common iliac artery starting basically at its origin extending down to just above the hypogastric artery.  There is mild stenosis in the left external iliac artery.             Disposition: Patient was taken to the recovery room in stable condition having  tolerated the procedure well.  Complications: None  [name] 11/19/2019 5:18 PM   This note was created with Dragon Medical transcription system. Any errors in dictation are purely unintentional.

## 2019-11-20 ENCOUNTER — Encounter: Payer: Self-pay | Admitting: Cardiology

## 2019-11-20 LAB — CBC
HCT: 34.7 % — ABNORMAL LOW (ref 36.0–46.0)
Hemoglobin: 10.8 g/dL — ABNORMAL LOW (ref 12.0–15.0)
MCH: 30.3 pg (ref 26.0–34.0)
MCHC: 31.1 g/dL (ref 30.0–36.0)
MCV: 97.5 fL (ref 80.0–100.0)
Platelets: 174 10*3/uL (ref 150–400)
RBC: 3.56 MIL/uL — ABNORMAL LOW (ref 3.87–5.11)
RDW: 14 % (ref 11.5–15.5)
WBC: 5.8 10*3/uL (ref 4.0–10.5)
nRBC: 0 % (ref 0.0–0.2)

## 2019-11-20 LAB — BASIC METABOLIC PANEL
Anion gap: 8 (ref 5–15)
BUN: 15 mg/dL (ref 6–20)
CO2: 25 mmol/L (ref 22–32)
Calcium: 8.1 mg/dL — ABNORMAL LOW (ref 8.9–10.3)
Chloride: 107 mmol/L (ref 98–111)
Creatinine, Ser: 0.82 mg/dL (ref 0.44–1.00)
GFR calc Af Amer: >60 mL/min (ref >60)
GFR calc non Af Amer: >60 mL/min (ref >60)
Glucose, Bld: 104 mg/dL — ABNORMAL HIGH (ref 70–99)
Potassium: 4.3 mmol/L (ref 3.5–5.1)
Sodium: 140 mmol/L (ref 135–145)

## 2019-11-20 MED ORDER — ADULT MULTIVITAMIN W/MINERALS CH
1.0000 | ORAL_TABLET | Freq: Every day | ORAL | Status: DC
  Administered 2019-11-21: 09:00:00 1 via ORAL
  Filled 2019-11-20: qty 1

## 2019-11-20 MED ORDER — ENSURE ENLIVE PO LIQD
237.0000 mL | Freq: Two times a day (BID) | ORAL | Status: DC
  Administered 2019-11-20 – 2019-11-21 (×2): 237 mL via ORAL

## 2019-11-20 NOTE — Evaluation (Signed)
Occupational Therapy Evaluation Patient Details Name: Carla [name] MRN: [id number] DOB: May 14, 1961 Today's Date: 11/20/2019    History of Present Illness admitted for acute hospitalization status post L LE angiogram with mechanical thrombectomy (99991111), complicated by R groin hematoma; s/p aortogram with bilat ileofemoral arteriogram with thrombectomy and stent (1/27).   Clinical Impression   Carla [name] was seen for OT evaluation this date. Prior to hospital admission, pt was independent in all aspects of ADL/IADL management including community mobility without an assistive device. Pt denies falls history in the past 12 months. Pt lives with her partner in a 1 level home with a level entry. Pt received seated in room recliner requesting this Carla [name] turn off her chair alarm so she can go to the bathroom. Pt states she has tried repeatedly to stand, but the chair alarm goes off each time. On one standing attempt pt states her PIV "got pulled out" pt noted with dried blood on R forearm and IV laying to side. This Chief Strategy Officer informed Financial controller through call bell system and requested pt RN be paged to room to assess. Pt ambulates to bathroom using the RW with supervision for safety and min cueing for safe technique. Pt uses room commode independently and is able to stand at sink to wash hands. OT provides education on safe mobility technique as well as energy conservation strategies to support pt safety and functional independence upon hospital DC. Pt returns to bed after ADL mgt. Pt demonstrates baseline independence to perform ADL. No skilled OT needs identified. Will sign off. Please re-consult if additional OT needs arise.     Follow Up Recommendations  No OT follow up    Equipment Recommendations  None recommended by OT    Recommendations for Other Services       Precautions / Restrictions Precautions Precautions: Fall Restrictions Weight Bearing Restrictions: No      Mobility Bed  Mobility Overal bed mobility: Modified Independent                Transfers Overall transfer level: Needs assistance Equipment used: Rolling walker (2 wheeled) Transfers: Sit to/from Stand Sit to Stand: Supervision         General transfer comment: heavy use of UEs to assist and to offload L LE    Balance Overall balance assessment: Needs assistance Sitting-balance support: No upper extremity supported;Feet supported Sitting balance-[name]: Good Sitting balance - Comments: Steady static and dynamic sitting during functional mobility. No LOB appreciated during assessment.   Standing balance support: During functional activity;No upper extremity supported Standing balance-[name]: Good Standing balance comment: Pt performs hand hygiene static standing at sink w/o UE support.                           ADL either performed or assessed with clinical judgement   ADL Overall ADL's : At baseline                                       General ADL Comments: Pt performs toilet transfer and toileting independently during session. States she feels at or near baseline for ADL management despite some increased pain in her LLE with mobility. 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 Pt and provider discuss outpatient options for hand therapy to support functional independence and pain management.     Vision Patient  Visual Report: No change from baseline       Perception     Praxis      Pertinent Vitals/Pain Pain Assessment: Faces Faces Pain Scale: Hurts a little bit Pain Location: L foot, abdomen Pain Descriptors / Indicators: Sore Pain Intervention(s): Limited activity within patient's tolerance;Monitored during session;Repositioned     Hand Dominance Left   Extremity/Trunk Assessment Upper Extremity Assessment Upper Extremity Assessment: Overall WFL for tasks assessed   Lower Extremity Assessment Lower  Extremity Assessment: Overall WFL for tasks assessed   Cervical / Trunk Assessment Cervical / Trunk Assessment: Normal   Communication Communication Communication: No difficulties   Cognition Arousal/Alertness: Awake/alert Behavior During Therapy: WFL for tasks assessed/performed Overall Cognitive Status: Within Functional Limits for tasks assessed                                 General Comments: intermittently tearful during session (when discussing chronic health concerns); OT utilized active listening and therapeutic use of self to provide pt with emotional support and encouragement t/o session.   General Comments       Exercises Other Exercises Other Exercises: Reviewed seated LE therex for use as HEP to encourage strength, flexibility, edema control and circulation.  Patient voiced understanding and agreement of all information provided. Other Exercises: Pt educated in falls prevention strategies, energy conservation strategies including pursed lip breathing and activity pacing, as well as safe transfer techniques and safe use of AE for functional mobility Other Exercises: Ot assists pt with toilet transfer this date. Pt requires supervision for safety during functional mobility. Otherwise independent for ADL management.   Shoulder Instructions      Home Living Family/patient expects to be discharged to:: Private residence Living Arrangements: Spouse/significant other Available Help at Discharge: Family;Available 24 hours/day Type of Home: House Home Access: Level entry     Home Layout: One level               Home Equipment: Walker - 2 wheels;Cane - single point;Bedside commode          Prior Functioning/Environment Level of Independence: Independent        Comments: Indep with ADLs, household and limited community mobilization without assist device. Denies recent fall history.        OT Problem List: Pain;Cardiopulmonary status limiting  activity;Decreased activity tolerance;Decreased safety awareness;Decreased knowledge of use of DME or AE      OT Treatment/Interventions:      OT Goals(Current goals can be found in the care plan section) Acute Rehab OT Goals Patient Stated Goal: to go home OT Goal Formulation: All assessment and education complete, DC therapy Time For Goal Achievement: 11/20/19 Potential to Achieve Goals: Good  OT Frequency:     Barriers to D/C:            Co-evaluation              AM-PAC OT "6 Clicks" Daily Activity     Outcome Measure Help from another person eating meals?: None Help from another person taking care of personal grooming?: None Help from another person toileting, which includes using toliet, bedpan, or urinal?: None Help from another person bathing (including washing, rinsing, drying)?: None Help from another person to put on and taking off regular upper body clothing?: None Help from another person to put on and taking off regular lower body clothing?: None 6 Click Score: 24   End of Session  Equipment Utilized During Treatment: Surveyor, mining Communication: Other (comment)(Pt removed IV prior to OT session.)  Activity Tolerance: Patient tolerated treatment well Patient left: in bed;with call bell/phone within reach;with bed alarm set  OT Visit Diagnosis: Other abnormalities of gait and mobility (R26.89);Pain Pain - Right/Left: Left Pain - part of body: Leg;Ankle and joints of foot                Time: 0218-0245 OT Time Calculation (min): 27 min Charges:  OT General Charges $OT Visit: 1 Visit OT Evaluation $OT Eval Moderate Complexity: 1 Mod OT Treatments $Self Care/Home Management : 8-22 mins  Shara Blazing, M.S., OTR/L Ascom: [phone] 11/20/19, 3:52 PM

## 2019-11-20 NOTE — Progress Notes (Signed)
[facility] Daily Progress Note   Subjective: 11/19/2019 1. Ultrasound guidance for vascular access bilateral femoral arteries 2. Catheter placement into aorta from bilateral femoral approaches 3. Aortogram and bilateral iliofemoral arteriograms 4.  Mechanical thrombectomy to the left common and external iliac arteries with the penumbra CAT D device 5.  Kissing balloon expandable stent placement to bilateral common iliac arteries using 7 mm diameter Lifestream's postdilated with an 8 mm balloon             6. StarClose closure device bilateral femoral arteries  11/17/2019: 1. Ultrasound guidance for vascular access right femoral artery 2. Catheter placement into left SFA from right femoral approach 3. Aortogram and selective left lower extremity angiogram including selective imaging of the left anterior tibial and left posterior tibial arteries 4. Percutaneous transluminal angioplasty of left anterior tibial artery with 2.5 mm diameter by 15 cm length angioplasty balloon 5.  Percutaneous transluminal angioplasty of the left posterior tibial artery with 2.5 mm diameter by 15 cm length angioplasty balloon             6.  Mechanical thrombectomy to both the left anterior tibial and left posterior tibial arteries with both the penumbra cat 6 device and the penumbra CAT Rx device in both vessels 7. StarClose closure device right femoral artery  Patient with some LLE discomfort this AM. No issues overnight.   Objective: Vitals:   11/19/19 1846 11/19/19 1931 11/20/19 0500 11/20/19 0525  BP: (!) 112/56 109/84  111/62  Pulse: (!) 56 (!) 57  66  Resp: 16 20  20   Temp: 98 F (36.7 C) 98.8 F (37.1 C)  98.3 F (36.8 C)  TempSrc: Oral Oral  Oral  SpO2: 98% 100%  93%  Weight:   114.5 kg   Height:        Intake/Output Summary (Last  24 hours) at 11/20/2019 1042 Last data filed at 11/20/2019 [id number] Gross per 24 hour  Intake 2418.3 ml  Output 2425 ml  Net -6.7 ml   Physical Exam: A&Ox3, NAD CV: RRR Pulmonary: CTA Bilaterally Abdomen:              Right Side: Mild ecchymosis noted tracking across right side of abdomen.  Hernia noted.  Rest of abdomen is soft nondistended nontender. Vascular:             Left lower extremity: Thigh soft.  Calf soft.  Extremities warm distally to toes.  Motor/sensory intact.             Right lower extremity: Soft.  Calf soft.  Extremities warm distally to toes.  Motor/sensory intact.   Laboratory: CBC    Component Value Date/Time   WBC 5.8 11/20/2019 0349   HGB 10.8 (L) 11/20/2019 0349   HGB 13.4 02/12/2015 1137   HCT 34.7 (L) 11/20/2019 0349   HCT 40.9 02/12/2015 1137   PLT 174 11/20/2019 0349   PLT 272 02/12/2015 1137   BMET    Component Value Date/Time   NA 140 11/20/2019 0349   NA 140 02/12/2015 1137   K 4.3 11/20/2019 0349   K 2.9 (L) 02/12/2015 1137   CL 107 11/20/2019 0349   CL 107 02/12/2015 1137   CO2 25 11/20/2019 0349   CO2 27 02/12/2015 1137   GLUCOSE 104 (H) 11/20/2019 0349   GLUCOSE 120 (H) 02/12/2015 1137   BUN 15 11/20/2019 0349   BUN 15 02/12/2015 1137   CREATININE 0.82 11/20/2019 0349   CREATININE  0.78 02/12/2015 1137   CALCIUM 8.1 (L) 11/20/2019 0349   CALCIUM 9.0 02/12/2015 1137   GFRNONAA >60 11/20/2019 0349   GFRNONAA >60 02/12/2015 1137   GFRAA >60 11/20/2019 0349   GFRAA >60 02/12/2015 1137   Assessment/Planning: The patient is a 59 year old female with 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 Will follow up with general surgery as an outpatient.   2) atherosclerotic disease to lower extremities: On Eliquis and ASA.  PT OT Ambulation today Most likely discharge home tomorrow  Discussed with Dr.  Ellis Parents [facility] PA-C 11/20/2019 10:42 AM

## 2019-11-20 NOTE — Evaluation (Signed)
Physical Therapy Evaluation Patient Details Name: Carla [name] MRN: [id number] DOB: [birth year] Today's Date: 11/20/2019   History of Present Illness  admitted for acute hospitalization status post L LE angiogram with mechanical thrombectomy (99991111), complicated by R groin hematoma; s/p aortogram with bilat ileofemoral arteriogram with thrombectomy and stent (1/27).  Clinical Impression  Upon evaluation, patient alert and oriented; follows commands and agreeable/eager for OOB activities.  Bilat UE/LE strength and ROM grossly symmetrical and WFL for basic transfers and mobility.  Rates pain in L foot 6-7/10 with dusky discoloration of L third toe noted.  Bilat groin sites clean and dry; ecchymosis to R site noted.  Able to complete bed mobility with mod indep; sit/stand, basic transfers and gait (60') with RW, cga/close sup.  Demonstrates partially reciprocal stepping pattern; heavy WBing on RW to offset L LE stance/WBing.  Good control, stability and overall safety awareness.  Would benefit from continued use of RW upon discharge. Would benefit from skilled PT to address above deficits and promote optimal return to PLOF.; Recommend transition to HHPT upon discharge from acute hospitalization.      Follow Up Recommendations Home health PT    Equipment Recommendations  Rolling walker with 5" wheels    Recommendations for Other Services       Precautions / Restrictions Precautions Precautions: Fall Restrictions Weight Bearing Restrictions: No      Mobility  Bed Mobility Overal bed mobility: Modified Independent                Transfers Overall transfer level: Needs assistance Equipment used: Rolling walker (2 wheeled) Transfers: Sit to/from Stand Sit to Stand: Min guard         General transfer comment: heavy use of UEs to assist and to offload L LE  Ambulation/Gait Ambulation/Gait assistance: Min guard Gait Distance (Feet): 60 Feet Assistive device: Rolling walker (2  wheeled)       General Gait Details: partially reciprocal stepping pattern; heavy WBing on RW to offset L LE stance/WBing.  Good control, stability and overall safety awareness.  Stairs            Wheelchair Mobility    Modified Rankin (Stroke Patients Only)       Balance Overall balance assessment: Needs assistance Sitting-balance support: No upper extremity supported;Feet supported Sitting balance-[name]: Good     Standing balance support: Bilateral upper extremity supported Standing balance-[name]: Fair                               Pertinent Vitals/Pain Pain Assessment: Faces Faces Pain Scale: Hurts even more Pain Location: L foot Pain Descriptors / Indicators: Aching;Guarding;Grimacing Pain Intervention(s): Monitored during session;Repositioned;Limited activity within patient's tolerance    Home Living Family/patient expects to be discharged to:: Private residence Living Arrangements: Spouse/significant other Available Help at Discharge: Family;Available 24 hours/day Type of Home: House Home Access: Level entry     Home Layout: One level        Prior Function Level of Independence: Independent         Comments: Indep with ADLs, household and limited community mobilization without assist device. Denies recent fall history.     Hand Dominance        Extremity/Trunk Assessment   Upper Extremity Assessment Upper Extremity Assessment: Overall WFL for tasks assessed    Lower Extremity Assessment Lower Extremity Assessment: Overall WFL for tasks assessed(grossly at least 4-/5 throughout; dusky discoloration noted to  distal half of L third toe)       Communication   Communication: No difficulties  Cognition Arousal/Alertness: Awake/alert Behavior During Therapy: WFL for tasks assessed/performed Overall Cognitive Status: Within Functional Limits for tasks assessed                                 General  Comments: intermittently tearful during session (when discussing passing of family members); provided with emotional support, empathetic listening, requested chaplain consult per primary RN      General Comments      Exercises Other Exercises Other Exercises: Reviewed seated LE therex for use as HEP to encourage strength, flexibility, edema control and circulation.  Patient voiced understanding and agreement of all information provided.   Assessment/Plan    PT Assessment Patient needs continued PT services  PT Problem List Decreased range of motion;Decreased activity tolerance;Decreased balance;Decreased mobility;Decreased knowledge of use of DME;Decreased skin integrity;Pain       PT Treatment Interventions DME instruction;Gait training;Functional mobility training;Therapeutic activities;Therapeutic exercise;Balance training;Patient/family education    PT Goals (Current goals can be found in the Care Plan section)  Acute Rehab PT Goals Patient Stated Goal: to go home PT Goal Formulation: With patient Time For Goal Achievement: 12/04/19 Potential to Achieve Goals: Good    Frequency Min 2X/week   Barriers to discharge        Co-evaluation               AM-PAC PT "6 Clicks" Mobility  Outcome Measure Help needed turning from your back to your side while in a flat bed without using bedrails?: None Help needed moving from lying on your back to sitting on the side of a flat bed without using bedrails?: None Help needed moving to and from a bed to a chair (including a wheelchair)?: None Help needed standing up from a chair using your arms (e.g., wheelchair or bedside chair)?: A Little Help needed to walk in hospital room?: A Little Help needed climbing 3-5 steps with a railing? : A Little 6 Click Score: 21    End of Session   Activity Tolerance: Patient tolerated treatment well Patient left: in chair;with call bell/phone within reach;with chair alarm set Nurse  Communication: Mobility status PT Visit Diagnosis: Difficulty in walking, not elsewhere classified (R26.2);Pain Pain - Right/Left: Left Pain - part of body: Ankle and joints of foot    Time: [id number] PT Time Calculation (min) (ACUTE ONLY): 32 min   Charges:   PT Evaluation $PT Eval Moderate Complexity: 1 Mod PT Treatments $Therapeutic Activity: 8-22 mins        Ramonita Koenig H. Owens Shark, PT, DPT, NCS 11/20/19, 2:08 PM [phone]

## 2019-11-20 NOTE — Progress Notes (Signed)
Ch spoke wit Pt over the phone to check up on progress. Pt sounded feeble. Pt says she is in touch with family. Ch prayed with Pt.   11/20/19 1531  Clinical Encounter Type  Visited With Patient  Visit Type Initial;Spiritual support  Referral From Other (Comment)  Consult/Referral To Other (Comment)  Spiritual Encounters  Spiritual Needs Prayer;Emotional  Stress Factors  Patient Stress Factors Health changes;Major life changes

## 2019-11-20 NOTE — Progress Notes (Signed)
Nutrition Education Note  RD consulted for nutrition education regarding a Heart Healthy diet.   59 year old female with known history of CAD, COPD, CHF, GERD, MDD who is admitted with  PAD with rest pain left foot and thrombus in the proximal left common iliac artery to the lower extremities status post endovascular intervention 1/27. Pt also COVID 19 positive  Pt is well known to nutrition department and this RD from numerous previous admits. Pt with good appetite and oral intake at baseline. Pt also drinks chocolate Ensure at home. Spoke with pt via phone, pt reports good appetite and oral intake in hospital. Pt reports that she did not eat much of her breakfast today because it was not what she ordered. RD will order chocolate Ensure in setting of COVID 19. RD provided heart healthy diet education today reiterating what we have already talked about in the past as pt has had numerous prior educations.   Lipid Panel     Component Value Date/Time   CHOL 304 (H) 07/22/2012 0549   TRIG 167 (H) 08/13/2017 0443   TRIG 311 (H) 07/22/2012 0549   HDL 51 07/22/2012 0549   VLDL 62 (H) 07/22/2012 0549   LDLCALC 191 (H) 07/22/2012 0549    RD provided "Heart Healthy Nutrition Therapy" handout from the Academy of Nutrition and Dietetics. Reviewed patient's dietary recall. Provided examples on ways to decrease sodium and fat intake in diet. Discouraged intake of processed foods and use of salt shaker. Encouraged fresh fruits and vegetables as well as whole grain sources of carbohydrates to maximize fiber intake. Teach back method used.  Expect poor compliance.  Body mass index is 46.17 kg/m. Pt meets criteria for morbid obesity based on current BMI. Per chart, pt with weight gain pta.   Current diet order is CHO, patient is consuming approximately 100% of meals at this time. Labs and medications reviewed. No further nutrition interventions warranted at this time. RD contact information provided. If  additional nutrition issues arise, please re-consult RD.  Koleen Distance MS, RD, LDN Pager #- [phone] Office#- [phone] After Hours Pager: [phone]

## 2019-11-21 LAB — BASIC METABOLIC PANEL
Anion gap: 8 (ref 5–15)
BUN: 13 mg/dL (ref 6–20)
CO2: 23 mmol/L (ref 22–32)
Calcium: 8.3 mg/dL — ABNORMAL LOW (ref 8.9–10.3)
Chloride: 109 mmol/L (ref 98–111)
Creatinine, Ser: 0.8 mg/dL (ref 0.44–1.00)
GFR calc Af Amer: >60 mL/min (ref >60)
GFR calc non Af Amer: >60 mL/min (ref >60)
Glucose, Bld: 117 mg/dL — ABNORMAL HIGH (ref 70–99)
Potassium: 4.1 mmol/L (ref 3.5–5.1)
Sodium: 140 mmol/L (ref 135–145)

## 2019-11-21 LAB — CBC
HCT: 34.3 % — ABNORMAL LOW (ref 36.0–46.0)
Hemoglobin: 10.8 g/dL — ABNORMAL LOW (ref 12.0–15.0)
MCH: 29.8 pg (ref 26.0–34.0)
MCHC: 31.5 g/dL (ref 30.0–36.0)
MCV: 94.5 fL (ref 80.0–100.0)
Platelets: 210 10*3/uL (ref 150–400)
RBC: 3.63 MIL/uL — ABNORMAL LOW (ref 3.87–5.11)
RDW: 13.6 % (ref 11.5–15.5)
WBC: 5.4 10*3/uL (ref 4.0–10.5)
nRBC: 0 % (ref 0.0–0.2)

## 2019-11-21 LAB — MAGNESIUM: Magnesium: 2.2 mg/dL (ref 1.7–2.4)

## 2019-11-21 MED ORDER — OXYCODONE HCL 5 MG PO TABS: ORAL_TABLET | ORAL | 0 refills | Status: DC

## 2019-11-21 MED ORDER — ASPIRIN 81 MG PO TBEC: 81.0000 mg | DELAYED_RELEASE_TABLET | Freq: Every day | ORAL | 3 refills | Status: DC

## 2019-11-21 MED ORDER — ADULT MULTIVITAMIN W/MINERALS CH: 1.0000 | ORAL_TABLET | Freq: Every day | ORAL | 3 refills | Status: DC

## 2019-11-21 MED ORDER — GABAPENTIN 100 MG PO CAPS: 100.0000 mg | ORAL_CAPSULE | Freq: Two times a day (BID) | ORAL | 3 refills | Status: DC

## 2019-11-21 NOTE — TOC Initial Note (Signed)
Transition of Care [facility]) - Initial/Assessment Note    Patient Details  Name: Carla [name] MRN: [id number] Date of Birth: May 21, 1961  [facility] CM/SW Contact:    Shelbie Hutching, RN Phone Number: 11/21/2019, 11:23 AM  Clinical Narrative:                 Patient admitted with lower limb ischemia and COVID +.  Patient is from home where she lives with her boyfriend.  Patient reports that she lives at the [facility] in [place] [address], and has been living there for 9 years.  Patient reports that she drives and is current with her PCP.  Patient will need a rolling walker at discharge and home health PT.  Floydene Flock with [facility] has accepted the referral for PT.  [facility] will provide the rolling walker.    Expected Discharge Plan: [place] Barriers to Discharge: Barriers Resolved   Patient Goals and CMS Choice Patient states their goals for this hospitalization and ongoing recovery are:: To get better and get my foot better CMS Medicare.gov Compare Post Acute Care list provided to:: Patient Choice offered to / list presented to : Patient  Expected Discharge Plan and Services Expected Discharge Plan: [place] In-house Referral: Clinical Social Work Discharge Planning Services: CM Consult Post Acute Care Choice: [place] arrangements for the past 2 months: Hotel/Motel Expected Discharge Date: 11/21/19               DME Arranged: [place]ford Rile rolling DME Agency: [facility] Date DME Agency Contacted: 11/21/19 Time DME Agency Contacted: 1120 Representative spoke with at DME Agency: [place]: PT [place]: [place] ([place]) Date [place]: 11/21/19 Time [place]: 1121 Representative spoke with at [place]: Floydene Flock  Prior Living Arrangements/Services Living arrangements for the past 2 months: Hotel/Motel Lives with:: Significant Other Patient  language and need for interpreter reviewed:: Yes Do you feel safe going back to the place where you live?: Yes      Need for Family Participation in Patient Care: Yes (Comment)(significant other) Care giver support system in place?: Yes (comment)(significant other)   Criminal Activity/Legal Involvement Pertinent to Current Situation/Hospitalization: No - Comment as needed  Activities of Daily Living Home Assistive Devices/Equipment: None ADL Screening (condition at time of admission) Patient's cognitive ability adequate to safely complete daily activities?: Yes Is the patient deaf or have difficulty hearing?: No Does the patient have difficulty seeing, even when wearing glasses/contacts?: Yes Does the patient have difficulty concentrating, remembering, or making decisions?: No Patient able to express need for assistance with ADLs?: Yes Does the patient have difficulty dressing or bathing?: No Independently performs ADLs?: Yes (appropriate for developmental age) Does the patient have difficulty walking or climbing stairs?: Yes Weakness of Legs: None Weakness of Arms/Hands: None  Permission Sought/Granted Permission sought to share information with : Case Manager, Other (comment) Permission granted to share information with : Yes, Verbal Permission Granted     Permission granted to share info w AGENCY: [place]        Emotional Assessment   Attitude/Demeanor/Rapport: Engaged Affect (typically observed): Accepting Orientation: : Oriented to Self, Oriented to Place, Oriented to  Time, Oriented to Situation Alcohol / Substance Use: Not Applicable Psych Involvement: No (comment)  Admission diagnosis:  Ischemia of left lower extremity [I99.8] Patient Active Problem List   Diagnosis Date Noted  .  Ischemia of left lower extremity 11/17/2019  . Carpal tunnel syndrome, right 11/10/2019  . Health care maintenance 03/26/2019  . Disorder of skeletal system  03/26/2019  . Problems influencing health status 03/26/2019  . Chronic low back pain (Secondary area of Pain) (Bilateral) w/o sciatica 03/26/2019  . Rib pain on left side 03/25/2019  . Aortic atherosclerosis ([place]) 09/12/2018  . COPD (chronic obstructive pulmonary disease) ([place]) 09/12/2018  . Moderate episode of recurrent major depressive disorder ([place]) 09/12/2018  . Lumbar spondylosis 09/06/2018  . History of total knee replacement (Right) 07/11/2018  . Ventral hernia with obstruction and without gangrene 07/02/2018  . Paroxysmal atrial fibrillation ([place]) 06/15/2018  . Accelerated hypertension 04/08/2018  . Acute encephalopathy 04/08/2018  . DDD (degenerative disc disease), lumbosacral 09/25/2017  . Osteoarthritis of knee 09/25/2017  . Adjustment disorder with anxiety 08/15/2017  . Chronic idiopathic constipation   . Protein-calorie malnutrition, severe 08/08/2017  . SBO (small bowel obstruction) ([place]) 08/06/2017  . Chronic mesenteric ischemia ([place])   . AKI (acute kidney injury) ([place]) 07/29/2017  . Hypotension 07/29/2017  . 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[place])   . Abdominal pain   . Occlusive mesenteric ischemia ([place]) 06/19/2017  . 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 Acute focal ischemia of small intestine (HCC)   . Acute right-sided low back pain with right-sided sciatica 06/13/2017  . Osteoarthritis of knee (Bilateral) 06/13/2017  . Chronic pain syndrome 09/13/2016  . Morbid obesity with BMI of 40.0-44.9, adult ([place]) 07/16/2016  . Personal history of other malignant neoplasm of skin 06/01/2016  . Chronic knee pain (Primary Area of Pain) (Bilateral) 01/19/2016  . Essential hypertension with goal blood pressure less than 140/90 01/19/2016  . Gastroesophageal reflux disease 01/19/2016  . Hyperlipidemia 01/19/2016  . Prediabetes 01/19/2016  . Recurrent major depressive disorder ([place]) 01/19/2016  . Urinary tract infection 01/09/2014  . Chronic pelvic pain in  female 11/14/2013  . Mixed stress and urge urinary incontinence 11/14/2013  . Chest pain 08/06/2012   PCP:  Kirk Ruths, MD Pharmacy:   [facility] [address]), [place] [address] [place])  [address] Phone: [phone] Fax: [phone]     Social Determinants of Health (SDOH) Interventions    Readmission Risk Interventions No flowsheet data found.

## 2019-11-21 NOTE — Discharge Summary (Signed)
[facility]    Discharge Summary  Patient ID:  Carla [name] MRN: [id number] DOB/AGE: 11-19-1960 59 y.o.  Admit date: 11/17/2019 Discharge date: 11/21/2019 Date of Surgery: 11/19/2019 Surgeon: Surgeon(s): Algernon Huxley, MD  Admission Diagnosis: Ischemia of left lower extremity [I99.8]  Discharge Diagnoses:  Ischemia of left lower extremity [I99.8]  Secondary Diagnoses: Past Medical History:  Diagnosis Date  . Abdominal aortic atherosclerosis ([place])    a. 05/2017 CTA abd/pelvis: significant atherosclerotic dzs of the infrarenal abd Ao w/ some mural thrombus. No aneurysm or dissection.  . Abdominal pain   . Acute focal ischemia of small intestine (HCC)   . Acute right-sided low back pain with right-sided sciatica 06/13/2017  . AKI (acute kidney injury) ([place]) 07/29/2017  . Baker's cyst of knee, right    a. 07/2016 U/S: 4.1 x 1.4 x 2.9 cystic structure in R poplitetal fossa.  . Bell's palsy   . Borderline diabetes   . Cancer ([place])    skin cancer on nose  . Chest pain    a. 08/2012 Lexiscan MV: EF 54%, non ischemia/infarct.  . CHF (congestive heart failure) ([place])   . Chronic idiopathic constipation   . Chronic mesenteric ischemia ([place])   . Diastolic dysfunction    a. 05/2017 Echo: Ef 60-65%, no rwma, Gr1 DD, no source of cardiac emboli.  . 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 05/2017 CTA Abd/pelvis: apparent thrombus or embolus in prox SMA (70-90%); b. 05/2017 catheter directed tPA, mechanical thrombectomy, and stenting of the SMA.  . Essential hypertension with goal blood pressure less than 140/90 01/19/2016  . Gastroesophageal reflux disease 01/19/2016  . GERD (gastroesophageal reflux disease)   . History of kidney stones   . Hyperlipidemia   . Hypertension   . Hypotension 07/29/2017  . Morbid obesity with BMI of 40.0-44.9, adult ([place])   . Occlusive mesenteric ischemia ([place]) 06/19/2017  . Personal history of other malignant neoplasm of skin  06/01/2016  . Primary osteoarthritis of both knees 06/13/2017  . Recurrent major depressive disorder ([place]) 01/19/2016  . SBO (small bowel obstruction) ([place]) 08/06/2017  . 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 Urinary tract infection 01/09/2014   Discharged Condition: Good  HPI / Hospital Course:  Patient is a 59 y.o.female with an ischemic left foot over the past week or so. The patient has noninvasive study showing flat digital waveforms and essentially no flow in the toes. The patient is brought in for angiography for further evaluation and potential treatment.  Due to the limb threatening nature of the situation, angiogram was performed for attempted limb salvage. The patient is aware that if the procedure fails, amputation would be expected.  The patient also understands that even with successful revascularization, amputation may still be required due to the severity of the situation.  Risks and benefits are discussed and informed consent is obtained. On 11/17/19, the patient underwent:  1. Ultrasound guidance for vascular access right femoral artery 2. Catheter placement into left SFA from right femoral approach 3. Aortogram and selective left lower extremity angiogram including selective imaging of the left anterior tibial and left posterior tibial arteries 4. Percutaneous transluminal angioplasty of left anterior tibial artery with 2.5 mm diameter by 15 cm length angioplasty balloon 5.  Percutaneous transluminal angioplasty of the left posterior tibial artery with 2.5 mm diameter by 15 cm length angioplasty balloon             6.  Mechanical  thrombectomy to both the left anterior tibial and left posterior tibial arteries with both the penumbra cat 6 device and the penumbra CAT Rx device in both vessels 7. StarClose closure device right femoral artery  The patient tolerated the procedure well was transferred  from the recovery room to the surgical floor without issue.  Patient with known right-sided hernia however overnight staff felt this had increased in size and now presented with ecchymosis a CTA of the abdomen / pelvis the following day was notable for:  1) Large right lower abdominal ventral hernia containing bowel without incarceration or obstruction.  General surgery was consulted and reviewed images.  No immediate/emergent intervention needed at this time.  Patient is to follow up with them in approximately 1 month in the outpatient setting.  The CTA was also notable for:  1) Left common iliac artery intraluminal hypodense tubular filling defect appearing nearly occlusive which may be related to vascular injury, thrombus formation or dissection.  Due to CT findings, the patient was taken back to the endovascular suite and on 11/19/19, the patient underwent;  1. Ultrasound guidance for vascular access bilateral femoral arteries 2. Catheter placement into aorta from bilateral femoral approaches 3. Aortogram and bilateral iliofemoral arteriograms 4.  Mechanical thrombectomy to the left common and external iliac arteries with the penumbra CAT D device 5.  Kissing balloon expandable stent placement to bilateral common iliac arteries using 7 mm diameter Lifestream's postdilated with an 8 mm balloon             6. StarClose closure device bilateral femoral arteries  She tolerated the procedure well was transferred back to the medical floor without issue.  The patient received PT/OT evaluations.  Physical therapy recommended a rolling walker and home physical therapy services and deemed it safe for the patient to be discharged home.  No recommendations per OT.  During the patient's brief inpatient stay, her diet was advanced, she was urinating independently, her pain was controlled with the use of p.o./IV pain medication and she was  ambulating at baseline.  On the day of discharge, the patient was afebrile with stable vital signs with improved physical exam.  Physical exam:  A&Ox3, NAD CV: RRR Pulmonary: CTA Bilaterally Abdomen: Right Side:Mild ecchymosis noted tracking across right side of abdomen. Hernia noted. Rest of abdomen is soft nondistended nontender. Vascular: Left lower extremity:Thigh soft. Calf soft. Extremities warm distally to toes. Motor/sensory intact. Right lower extremity:Soft. Calf soft. Extremities warm distally to toes. Motor/sensory intact.  Labs as below  Complications: None  Consults: None  Significant Diagnostic Studies: CBC Lab Results  Component Value Date   WBC 5.4 11/21/2019   HGB 10.8 (L) 11/21/2019   HCT 34.3 (L) 11/21/2019   MCV 94.5 11/21/2019   PLT 210 11/21/2019   BMET    Component Value Date/Time   NA 140 11/21/2019 0816   NA 140 02/12/2015 1137   K 4.1 11/21/2019 0816   K 2.9 (L) 02/12/2015 1137   CL 109 11/21/2019 0816   CL 107 02/12/2015 1137   CO2 23 11/21/2019 0816   CO2 27 02/12/2015 1137   GLUCOSE 117 (H) 11/21/2019 0816   GLUCOSE 120 (H) 02/12/2015 1137   BUN 13 11/21/2019 0816   BUN 15 02/12/2015 1137   CREATININE 0.80 11/21/2019 0816   CREATININE 0.78 02/12/2015 1137   CALCIUM 8.3 (L) 11/21/2019 0816   CALCIUM 9.0 02/12/2015 1137   GFRNONAA >60 11/21/2019 0816   GFRNONAA >60 02/12/2015 1137   GFRAA >  60 11/21/2019 0816   GFRAA >60 02/12/2015 1137   COAG Lab Results  Component Value Date   INR 1.0 11/07/2019   INR 1.04 07/08/2018   INR 1.00 02/15/2018   Disposition:  Discharge to :Home  Allergies as of 11/21/2019      Reactions   Bactrim [sulfamethoxazole-trimethoprim] Itching      Medication List    STOP taking these medications   oxyCODONE-acetaminophen 5-325 MG tablet Commonly known as: Percocet     TAKE these medications   albuterol 108 (90 Base) MCG/ACT inhaler Commonly  known as: VENTOLIN HFA   amLODipine 5 MG tablet Commonly known as: NORVASC Take 1 tablet (5 mg total) by mouth daily.   apixaban 5 MG Tabs tablet Commonly known as: ELIQUIS Take 5 mg by mouth 2 (two) times daily.   aspirin 81 MG EC tablet Take 1 tablet (81 mg total) by mouth daily. Start taking on: November 22, 2019   atorvastatin 80 MG tablet Commonly known as: LIPITOR Take 80 mg by mouth at bedtime.   buPROPion 150 MG 24 hr tablet Commonly known as: WELLBUTRIN XL Take 150 mg by mouth daily.   clotrimazole-betamethasone cream Commonly known as: LOTRISONE Apply 1 application topically 2 (two) times daily as needed (for dry skin).   diphenhydrAMINE 25 MG tablet Commonly known as: BENADRYL Take 1 tablet (25 mg total) by mouth every 8 (eight) hours as needed for itching or allergies.   DULoxetine 60 MG capsule Commonly known as: CYMBALTA Take 60 mg by mouth daily.   ferrous sulfate 325 (65 FE) MG tablet Take 325 mg by mouth daily.   gabapentin 100 MG capsule Commonly known as: NEURONTIN Take 1 capsule (100 mg total) by mouth 2 (two) times daily.   lidocaine 5 % Commonly known as: Lidoderm Place 1 patch onto the skin daily. Remove & Discard patch within 12 hours or as directed by MD   metoprolol tartrate 50 MG tablet Commonly known as: LOPRESSOR Take 1 tablet (50 mg total) by mouth 2 (two) times daily.   multivitamin with minerals Tabs tablet Take 1 tablet by mouth daily. Start taking on: November 22, 2019   omeprazole 20 MG capsule Commonly known as: PRILOSEC Take 20 mg by mouth 2 (two) times daily before a meal.   ondansetron 4 MG tablet Commonly known as: Zofran Take 1 tablet (4 mg total) by mouth every 8 (eight) hours as needed.   oxyCODONE 5 MG immediate release tablet Commonly known as: Oxy IR/ROXICODONE One to Two Tabs Every Six Hours As Needed For Pain   topiramate 25 MG tablet Commonly known as: TOPAMAX Take 25 mg by mouth 2 (two) times daily.             Durable Medical Equipment  (From admission, onward)         Start     Ordered   11/20/19 1737  For home use only DME Walker rolling  Once    Question Answer Comment  Walker: With 5 Inch Wheels   Patient needs a walker to treat with the following condition PAD (peripheral artery disease) ([place])      11/20/19 1738         Verbal and written Discharge instructions given to the patient. Wound care per Discharge AVS Follow-up Information    [place], [place] F, MD Follow up in 1 month(s).   Specialty: General Surgery Why: To discuss your hernia found on CT scan during your inpatient stay.  Contact information: [phone]  [address] [place] [address] [phone]        Algernon Huxley, MD Follow up in 2 week(s).   Specialties: Vascular Surgery, Radiology, Interventional Cardiology Why: Can see Arna Medici or [place]. Will need ABI with visit.  Contact information: [place] [address] [id number]          Signed: Sela Hua, PA-C  11/21/2019, 12:17 PM

## 2019-11-21 NOTE — TOC Transition Note (Signed)
Transition of Care [facility]) - CM/SW Discharge Note   Patient Details  Name: IRHA ARNET MRN: [id number] Date of Birth: [birth year]  Transition of Care [facility]) CM/SW Contact:  Shelbie Hutching, RN Phone Number: 11/21/2019, 1:01 PM   Clinical Narrative:    Patient medically ready to discharge home.  Home health set up with [facility] for PT.  Rolling walker delivered up to the isolation unit for bedside RN to take to patient before DC.  Patient's boyfriend will pick her up and transport her home.    Final next level of care: [place] Barriers to Discharge: Barriers Resolved   Patient Goals and CMS Choice Patient states their goals for this hospitalization and ongoing recovery are:: To get better and get my foot better CMS Medicare.gov Compare Post Acute Care list provided to:: Patient Choice offered to / list presented to : Patient  Discharge Placement                       Discharge Plan and Services In-house Referral: Clinical Social Work Discharge Planning Services: CM Consult Post Acute Care Choice: Home Health          DME Arranged: Walker rolling DME Agency: [employer] Date DME Agency Contacted: 11/21/19 Time DME Agency Contacted: [id number] Representative spoke with at DME Agency: [place]: PT [place]: [place] ([place]) Date [place]: 11/21/19 Time [place]: 1121 Representative spoke with at [place]: [place] (SDOH) Interventions     Readmission Risk Interventions No flowsheet data found.

## 2019-11-21 NOTE — Discharge Instructions (Signed)
Vascular Surgery Discharge Instructions 1) You may shower. Keep groin clean and dry.

## 2019-11-21 NOTE — Progress Notes (Signed)
D/c teaching and instructions have been given to pt according to [facility] policy. Daughter Velna Hatchet was called, educated and updated. PT is going home with a walker

## 2019-11-23 ENCOUNTER — Other Ambulatory Visit: Payer: Self-pay

## 2019-11-23 ENCOUNTER — Emergency Department
Admission: EM | Admit: 2019-11-23 | Discharge: 2019-11-23 | Disposition: A | Discharge status: Home / Self Care | Payer: Medicare Other | Attending: Student | Admitting: Student

## 2019-11-23 ENCOUNTER — Emergency Department: Payer: Medicare Other

## 2019-11-23 DIAGNOSIS — I10 Essential (primary) hypertension: Secondary | ICD-10-CM | POA: Insufficient documentation

## 2019-11-23 DIAGNOSIS — M79672 Pain in left foot: Secondary | ICD-10-CM | POA: Diagnosis present

## 2019-11-23 DIAGNOSIS — F17210 Nicotine dependence, cigarettes, uncomplicated: Secondary | ICD-10-CM | POA: Insufficient documentation

## 2019-11-23 DIAGNOSIS — Z7901 Long term (current) use of anticoagulants: Secondary | ICD-10-CM | POA: Diagnosis not present

## 2019-11-23 DIAGNOSIS — Z7982 Long term (current) use of aspirin: Secondary | ICD-10-CM | POA: Diagnosis not present

## 2019-11-23 DIAGNOSIS — M7989 Other specified soft tissue disorders: Secondary | ICD-10-CM | POA: Diagnosis not present

## 2019-11-23 DIAGNOSIS — J449 Chronic obstructive pulmonary disease, unspecified: Secondary | ICD-10-CM | POA: Insufficient documentation

## 2019-11-23 DIAGNOSIS — Z96651 Presence of right artificial knee joint: Secondary | ICD-10-CM | POA: Insufficient documentation

## 2019-11-23 DIAGNOSIS — Z79899 Other long term (current) drug therapy: Secondary | ICD-10-CM | POA: Diagnosis not present

## 2019-11-23 LAB — APTT: aPTT: 32 seconds (ref 24–36)

## 2019-11-23 LAB — PROTIME-INR
INR: 1.1 (ref 0.8–1.2)
Prothrombin Time: 13.9 seconds (ref 11.4–15.2)

## 2019-11-23 LAB — COMPREHENSIVE METABOLIC PANEL
ALT: 16 U/L (ref 0–44)
AST: 18 U/L (ref 15–41)
Albumin: 3.1 g/dL — ABNORMAL LOW (ref 3.5–5.0)
Alkaline Phosphatase: 105 U/L (ref 38–126)
Anion gap: 10 (ref 5–15)
BUN: 10 mg/dL (ref 6–20)
CO2: 21 mmol/L — ABNORMAL LOW (ref 22–32)
Calcium: 8.1 mg/dL — ABNORMAL LOW (ref 8.9–10.3)
Chloride: 108 mmol/L (ref 98–111)
Creatinine, Ser: 0.83 mg/dL (ref 0.44–1.00)
GFR calc Af Amer: >60 mL/min (ref >60)
GFR calc non Af Amer: >60 mL/min (ref >60)
Glucose, Bld: 118 mg/dL — ABNORMAL HIGH (ref 70–99)
Potassium: 3.9 mmol/L (ref 3.5–5.1)
Sodium: 139 mmol/L (ref 135–145)
Total Bilirubin: 0.3 mg/dL (ref 0.3–1.2)
Total Protein: 6.2 g/dL — ABNORMAL LOW (ref 6.5–8.1)

## 2019-11-23 LAB — CBC WITH DIFFERENTIAL/PLATELET
Abs Immature Granulocytes: 0.04 10*3/uL (ref 0.00–0.07)
Basophils Absolute: 0 10*3/uL (ref 0.0–0.1)
Basophils Relative: 0 %
Eosinophils Absolute: 0.1 10*3/uL (ref 0.0–0.5)
Eosinophils Relative: 2 %
HCT: 34.8 % — ABNORMAL LOW (ref 36.0–46.0)
Hemoglobin: 10.9 g/dL — ABNORMAL LOW (ref 12.0–15.0)
Immature Granulocytes: 1 %
Lymphocytes Relative: 23 %
Lymphs Abs: 1.4 10*3/uL (ref 0.7–4.0)
MCH: 29.9 pg (ref 26.0–34.0)
MCHC: 31.3 g/dL (ref 30.0–36.0)
MCV: 95.6 fL (ref 80.0–100.0)
Monocytes Absolute: 0.5 10*3/uL (ref 0.1–1.0)
Monocytes Relative: 8 %
Neutro Abs: 4 10*3/uL (ref 1.7–7.7)
Neutrophils Relative %: 66 %
Platelets: 288 10*3/uL (ref 150–400)
RBC: 3.64 MIL/uL — ABNORMAL LOW (ref 3.87–5.11)
RDW: 13.8 % (ref 11.5–15.5)
WBC: 6 10*3/uL (ref 4.0–10.5)
nRBC: 0 % (ref 0.0–0.2)

## 2019-11-23 MED ORDER — FENTANYL CITRATE (PF) 100 MCG/2ML IJ SOLN: 50.0000 ug | Freq: Once | INTRAMUSCULAR | Status: DC

## 2019-11-23 MED ORDER — MORPHINE SULFATE (PF) 4 MG/ML IV SOLN
4.0000 mg | Freq: Once | INTRAVENOUS | Status: AC
  Administered 2019-11-23: 4 mg via INTRAVENOUS
  Filled 2019-11-23: qty 1

## 2019-11-23 NOTE — ED Notes (Signed)
Pt presents to ED via POV with c/o L foot pain. Pt states had surgeries to remove blood clots, from chart review patient had surgery on 1/27. Pt noted to have discloration to 3rd toe of L foot. Pt states toe was previously discolored and foot was painful however yesterday pain became worse and and black discoloration to 3rd toe became worse. Pt also c/o new redness to top of L foot. Pt noted to be tearful upon assessment. Pt c/o pain with touch to L foot and LLE. EDP at bedside with doppler to assess for pedal pulse.

## 2019-11-23 NOTE — Discharge Instructions (Addendum)
Thank you for letting us take care of you in the emergency department today.  Please continue to take any regular, prescribed medications. When you take your two tablets of oxycodone (every 6 hours) you can also take 500 or 650 mg of Tylenol with this as well (whichever dosing is easiest based on the over the counter medication).  Please follow up with: - Your vascular doctor to review your ER visit and follow up on your symptoms. Call to schedule an appointment next week.   Please return to the ER for any new or worsening symptoms.

## 2019-11-23 NOTE — ED Notes (Signed)
NAD noted at time of D/C. Pt taken to lobby via wheelchair. Denies comments/concerns regarding D/C instructions. Pt D/C into the care of her boyfriend who she states is here to pick her up.

## 2019-11-23 NOTE — ED Notes (Signed)
EDP at bedside to update patient.

## 2019-11-23 NOTE — ED Triage Notes (Addendum)
Pt had 2 surgeries last week to remove blood cot from left foot and leg - last pm the pt started with severe pain to left middle toe and the toe is becoming discolored/purple PT IS COVID+

## 2019-11-23 NOTE — ED Notes (Signed)
US at bedside

## 2019-11-23 NOTE — ED Provider Notes (Addendum)
[facility] Emergency Department Provider Note  ____________________________________________   First MD Initiated Contact with Patient 11/23/19 1309     (approximate)  I have reviewed the triage vital signs and the nursing notes.  History  Chief Complaint Foot Pain    HPI Carla [name] is a 59 y.o. female with extensive past medical history as noted below, including severe PAD, recent admission for ischemia of the LLE status post angiography on (1/25) who presents to the emergency department for left foot pain and swelling, as well as worsening discoloration to her 3rd toe. She states her toe has already been discolored for some time, but since yesterday it has become darker and gotten worse. She also reports associated, new onset swelling to the top of her foot and ankle area.  This also started yesterday.  This swelling is causing significant discomfort.  Describes the pain as burning, sharp, 10/10 in severity.  No radiation.  No alleviating or aggravating components. No preceding trauma. Has been ambulatory. No weakness, numbness, tingling.   Reports compliance with all of her medications, including her anticoagulation.  Notably, also COVID positive on 1/22. Denies any symptoms related to this.    Past Medical Hx Past Medical History:  Diagnosis Date  . Abdominal aortic atherosclerosis ([place])    a. 05/2017 CTA abd/pelvis: significant atherosclerotic dzs of the infrarenal abd Ao w/ some mural thrombus. No aneurysm or dissection.  . Abdominal pain   . Acute focal ischemia of small intestine (HCC)   . Acute right-sided low back pain with right-sided sciatica 06/13/2017  . AKI (acute kidney injury) ([place]) 07/29/2017  . Baker's cyst of knee, right    a. 07/2016 U/S: 4.1 x 1.4 x 2.9 cystic structure in R poplitetal fossa.  . Bell's palsy   . Borderline diabetes   . Cancer ([place])    skin cancer on nose  . Chest pain    a. 08/2012 Lexiscan MV: EF 54%, non  ischemia/infarct.  . CHF (congestive heart failure) ([place])   . Chronic idiopathic constipation   . Chronic mesenteric ischemia ([place])   . Diastolic dysfunction    a. 05/2017 Echo: Ef 60-65%, no rwma, Gr1 DD, no source of cardiac emboli.  . 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 05/2017 CTA Abd/pelvis: apparent thrombus or embolus in prox SMA (70-90%); b. 05/2017 catheter directed tPA, mechanical thrombectomy, and stenting of the SMA.  . Essential hypertension with goal blood pressure less than 140/90 01/19/2016  . Gastroesophageal reflux disease 01/19/2016  . GERD (gastroesophageal reflux disease)   . History of kidney stones   . Hyperlipidemia   . Hypertension   . Hypotension 07/29/2017  . Morbid obesity with BMI of 40.0-44.9, adult ([place])   . Occlusive mesenteric ischemia ([place]) 06/19/2017  . Personal history of other malignant neoplasm of skin 06/01/2016  . Primary osteoarthritis of both knees 06/13/2017  . Recurrent major depressive disorder ([place]) 01/19/2016  . SBO (small bowel obstruction) ([place]) 08/06/2017  . 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 Urinary tract infection 01/09/2014    Problem List Patient Active Problem List   Diagnosis Date Noted  . Ischemia of left lower extremity 11/17/2019  . Carpal tunnel syndrome, right 11/10/2019  . Health care maintenance 03/26/2019  . Disorder of skeletal system 03/26/2019  . Problems influencing health status 03/26/2019  . Chronic low back pain (Secondary area of Pain) (Bilateral) w/o sciatica 03/26/2019  . Rib pain on left side 03/25/2019  .  Aortic atherosclerosis ([place]) 09/12/2018  . COPD (chronic obstructive pulmonary disease) ([place]) 09/12/2018  . Moderate episode of recurrent major depressive disorder ([place]) 09/12/2018  . Lumbar spondylosis 09/06/2018  . History of total knee replacement (Right) 07/11/2018  . Ventral hernia with obstruction and without gangrene 07/02/2018  . Paroxysmal atrial fibrillation ([place]) 06/15/2018   . Accelerated hypertension 04/08/2018  . Acute encephalopathy 04/08/2018  . DDD (degenerative disc disease), lumbosacral 09/25/2017  . Osteoarthritis of knee 09/25/2017  . Adjustment disorder with anxiety 08/15/2017  . Chronic idiopathic constipation   . Protein-calorie malnutrition, severe 08/08/2017  . SBO (small bowel obstruction) ([place]) 08/06/2017  . Chronic mesenteric ischemia ([place])   . AKI (acute kidney injury) ([place]) 07/29/2017  . Hypotension 07/29/2017  . 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 Abdominal pain   . Occlusive mesenteric ischemia ([place]) 06/19/2017  . 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 Acute focal ischemia of small intestine (HCC)   . Acute right-sided low back pain with right-sided sciatica 06/13/2017  . Osteoarthritis of knee (Bilateral) 06/13/2017  . Chronic pain syndrome 09/13/2016  . Morbid obesity with BMI of 40.0-44.9, adult ([place]) 07/16/2016  . Personal history of other malignant neoplasm of skin 06/01/2016  . Chronic knee pain (Primary Area of Pain) (Bilateral) 01/19/2016  . Essential hypertension with goal blood pressure less than 140/90 01/19/2016  . Gastroesophageal reflux disease 01/19/2016  . Hyperlipidemia 01/19/2016  . Prediabetes 01/19/2016  . Recurrent major depressive disorder ([place]) 01/19/2016  . Urinary tract infection 01/09/2014  . Chronic pelvic pain in female 11/14/2013  . Mixed stress and urge urinary incontinence 11/14/2013  . Chest pain 08/06/2012    Past Surgical Hx Past Surgical History:  Procedure Laterality Date  . APPENDECTOMY    . CHOLECYSTECTOMY    . LAPAROTOMY N/A 08/08/2017   Procedure: EXPLORATORY LAPAROTOMY POSSIBLE BOWEL RESECTION;  Surgeon: Jules Husbands, MD;  Location: [facility] ORS;  Service: General;  Laterality: N/A;  . LOWER EXTREMITY ANGIOGRAPHY Left 11/17/2019   Procedure: LOWER EXTREMITY ANGIOGRAPHY;  Surgeon: Algernon Huxley, MD;  Location: [place] [facility];  Service: Cardiovascular;   Laterality: Left;  . LOWER EXTREMITY INTERVENTION N/A 11/19/2019   Procedure: LOWER EXTREMITY INTERVENTION;  Surgeon: Algernon Huxley, MD;  Location: [place];  Service: Cardiovascular;  Laterality: N/A;  . TEE WITHOUT CARDIOVERSION N/A 06/22/2017   Procedure: TRANSESOPHAGEAL ECHOCARDIOGRAM (TEE);  Surgeon: Wellington Hampshire, MD;  Location: [facility] OR[facility];  Service: Cardiovascular;  Laterality: N/A;  . TOTAL KNEE ARTHROPLASTY Right 07/11/2018   Procedure: TOTAL KNEE ARTHROPLASTY;  Surgeon: Corky Mull, MD;  Location: [facility];  Service: Orthopedics;  Laterality: Right;  [name] VAGINAL HYSTERECTOMY    . VISCERAL ARTERY INTERVENTION N/A 06/20/2017   Procedure: Visceral Artery Intervention, possible aortic thrombectomy;  Surgeon: Algernon Huxley, MD;  Location: [facility];  Service: Cardiovascular;  Laterality: N/A;  . VISCERAL ARTERY INTERVENTION N/A 01/28/2018   Procedure: VISCERAL ARTERY INTERVENTION;  Surgeon: Algernon Huxley, MD;  Location: [facility];  Service: Cardiovascular;  Laterality: N/A;    Medications Prior to Admission medications   Medication Sig Start Date End Date Taking? Authorizing Provider  albuterol (VENTOLIN HFA) 108 (90 Base) MCG/ACT inhaler  09/03/19   [provider]  amLODipine (NORVASC) 5 MG tablet Take 1 tablet (5 mg total) by mouth daily. 04/10/18   Gladstone Lighter, MD  apixaban (ELIQUIS) 5 MG TABS tablet Take 5 mg by mouth 2 (two) times daily.  [provider]  aspirin EC 81 MG EC tablet Take 1 tablet (81 mg total) by mouth daily. 11/22/19   Stegmayer, Joelene Millin A, PA-C  atorvastatin (LIPITOR) 80 MG tablet Take 80 mg by mouth at bedtime.     [provider]  buPROPion (WELLBUTRIN XL) 150 MG 24 hr tablet Take 150 mg by mouth daily.  07/03/19 07/02/20  [provider]  clotrimazole-betamethasone (LOTRISONE) cream Apply 1 application topically 2 (two) times daily as needed (for dry skin).     [provider]    diphenhydrAMINE (BENADRYL) 25 MG tablet Take 1 tablet (25 mg total) by mouth every 8 (eight) hours as needed for itching or allergies. Patient not taking: Reported on 11/14/2019 02/04/19   Hillary Bow, MD  DULoxetine (CYMBALTA) 60 MG capsule Take 60 mg by mouth daily.    [provider]  ferrous sulfate 325 (65 FE) MG tablet Take 325 mg by mouth daily.    [provider]  gabapentin (NEURONTIN) 100 MG capsule Take 1 capsule (100 mg total) by mouth 2 (two) times daily. 11/21/19   Stegmayer, Joelene Millin A, PA-C  lidocaine (LIDODERM) 5 % Place 1 patch onto the skin daily. Remove & Discard patch within 12 hours or as directed by MD Patient not taking: Reported on 11/14/2019 07/11/19   Laban Emperor, PA-C  metoprolol tartrate (LOPRESSOR) 50 MG tablet Take 1 tablet (50 mg total) by mouth 2 (two) times daily. 06/15/18   Bettey Costa, MD  Multiple Vitamin (MULTIVITAMIN WITH MINERALS) TABS tablet Take 1 tablet by mouth daily. 11/22/19   Stegmayer, Janalyn Harder, PA-C  omeprazole (PRILOSEC) 20 MG capsule Take 20 mg by mouth 2 (two) times daily before a meal.    [provider]  ondansetron (ZOFRAN) 4 MG tablet Take 1 tablet (4 mg total) by mouth every 8 (eight) hours as needed. Patient not taking: Reported on 11/14/2019 11/10/18   Nance Pear, MD  oxyCODONE (OXY IR/ROXICODONE) 5 MG immediate release tablet One to Two Tabs Every Six Hours As Needed For Pain 11/21/19   Stegmayer, Joelene Millin A, PA-C  topiramate (TOPAMAX) 25 MG tablet Take 25 mg by mouth 2 (two) times daily.  07/03/19 07/02/20  [provider]    Allergies Bactrim [sulfamethoxazole-trimethoprim]  Family Hx Family History  Problem Relation Age of Onset  . Hypertension Mother   . Heart disease Mother   . Heart attack Mother   . Breast cancer Mother   . Hypertension Father   . Breast cancer Paternal Aunt     Social Hx Social History   Tobacco Use  . Smoking status: Current Every Day Smoker    Packs/day:  0.50    Types: Cigarettes  . Smokeless tobacco: Never Used  Substance Use Topics  . Alcohol use: No  . Drug use: No     Review of Systems  Constitutional: Negative for fever, chills. Eyes: Negative for visual changes. ENT: Negative for sore throat. Cardiovascular: Negative for chest pain. Respiratory: Negative for shortness of breath. Gastrointestinal: Negative for nausea, vomiting.  Genitourinary: Negative for dysuria. Musculoskeletal: + foot swelling, toe discoloration Skin: Negative for rash. Neurological: Negative for headaches.   Physical Exam  Vital Signs: ED Triage Vitals  Enc Vitals Group     BP 11/23/19 1310 126/84     Pulse Rate 11/23/19 1310 77     Resp 11/23/19 1310 20     Temp 11/23/19 1310 98.3 F (36.8 C)     Temp Source 11/23/19 1310 Oral  SpO2 11/23/19 1310 97 %     Weight 11/23/19 1307 245 lb (111.1 kg)     Height 11/23/19 1307 5\' 2"  (1.575 m)     Head Circumference --      Peak Flow --      Pain Score 11/23/19 1307 10     Pain Loc --      Pain Edu? --      Excl. in [place]? --      Constitutional: Alert and oriented.  Head: Normocephalic. Atraumatic. Eyes: Conjunctivae clear. Sclera anicteric. Nose: No congestion. No rhinorrhea. Mouth/Throat: Wearing mask.  Neck: No stridor.   Cardiovascular: Normal rate, regular rhythm.  LLE: Toes warm. Diminished, but present DP pulse by doppler. RLE: Toes warm. Palpable 2+ DP pulse. Loud, biphasic on doppler. Respiratory: Normal respiratory effort.  Lungs CTAB. Gastrointestinal: Soft. Non-tender. Non-distended.  Musculoskeletal: Pitting edema to dorsum of the L foot. Neurologic:  Normal speech and language. Able to wiggle toes and range at ankle, but with discomfort on the LEFT. Skin: Dry purple discoloration to the tip of the 3rd toe on LEFT with small amount of surrounding erythema. Psychiatric: Mood and affect are appropriate for situation.  EKG  Personally reviewed.   Rate: 67 Rhythm:  sinus Axis: normal Intervals: WNL No STEMI    Radiology  US DVT: IMPRESSION:  No evidence of left lower extremity DVT.  2.4 cm Baker cyst in the left popliteal fossa.    Procedures  Procedure(s) performed (including critical care):  Procedures   Initial Impression / Assessment and Plan / ED Course  59 y.o. female who presents to the ED for foot swelling and toe discoloration after recent angiogram with vascular surgery on 1/25.  Patient with a long history of PAD. Reports compliance with her medications, including her Eliquis.  Discussed case with vascular surgery, who states it is not atypical to develop some edema after revascularization procedure due to the increase in flow.  Also not unusual to develop more finite demarcation of areas of ischemia after angiogram as well, which all fits with the patient's presentation today.  No evidence on exam of wet or dry gangrene or superimposed infection.  Afebrile, no leukocytosis. No preceding trauma to suggest fracture.  Will rule out lower extremity DVT, if negative she is otherwise stable for discharge with outpatient vascular follow-up.  Labs without actual derangements.  Ultrasound negative for DVT.  As such, patient stable for discharge with outpatient follow-up.  Advise follow-up with vascular surgery within 1 week.  Continue medications as prescribed.  Patient voiced understanding is comfortable plan at discharge.  Given return precautions.   Final Clinical Impression(s) / ED Diagnosis  Final diagnoses:  Foot pain, left  Foot swelling       Note:  This document was prepared using Dragon voice recognition software and may include unintentional dictation errors.     Lilia Pro., MD 11/23/19 [phone]

## 2019-11-25 ENCOUNTER — Telehealth (INDEPENDENT_AMBULATORY_CARE_PROVIDER_SITE_OTHER): Payer: Self-pay | Admitting: Vascular Surgery

## 2019-11-25 ENCOUNTER — Ambulatory Visit (INDEPENDENT_AMBULATORY_CARE_PROVIDER_SITE_OTHER): Payer: Medicare Other | Admitting: Vascular Surgery

## 2019-11-25 ENCOUNTER — Other Ambulatory Visit: Payer: Self-pay

## 2019-11-25 ENCOUNTER — Encounter (INDEPENDENT_AMBULATORY_CARE_PROVIDER_SITE_OTHER): Payer: Self-pay | Admitting: Vascular Surgery

## 2019-11-25 VITALS — BP 158/98 | HR 96 | Resp 20 | Ht 62.0 in | Wt 243.0 lb

## 2019-11-25 DIAGNOSIS — I480 Paroxysmal atrial fibrillation: Secondary | ICD-10-CM

## 2019-11-25 DIAGNOSIS — I70222 Atherosclerosis of native arteries of extremities with rest pain, left leg: Secondary | ICD-10-CM | POA: Diagnosis not present

## 2019-11-25 DIAGNOSIS — I998 Other disorder of circulatory system: Secondary | ICD-10-CM

## 2019-11-25 DIAGNOSIS — E785 Hyperlipidemia, unspecified: Secondary | ICD-10-CM

## 2019-11-25 DIAGNOSIS — K436 Other and unspecified ventral hernia with obstruction, without gangrene: Secondary | ICD-10-CM

## 2019-11-25 DIAGNOSIS — I10 Essential (primary) hypertension: Secondary | ICD-10-CM

## 2019-11-25 NOTE — Assessment & Plan Note (Signed)
lipid control important in reducing the progression of atherosclerotic disease. Continue statin therapy  

## 2019-11-25 NOTE — Progress Notes (Signed)
MRN : [id number]  Carla [name] is a 59 y.o. ([birth year]) female who presents with chief complaint of  Chief Complaint  Patient presents with  . Leg Pain    LLE toe pain  .  History of Present Illness: Patient returns today in follow up of arterial insufficiency status post 2 revascularization procedures last week.  At this point, her left third toe is black and gangrenous appearing at the tip but the other toes appear viable.  She had intervention all the way down into her foot with recanalization of the posterior tibial artery to be continuous distally.  The anterior tibial artery remains occluded in the foot but was able to be opened down into the foot.  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  She has some bruising at the access sites but they have healed well.  The toe is extremely painful.  She is scheduled to see podiatry tomorrow.  She remains on her anticoagulation.  Current Outpatient Medications  Medication Sig Dispense Refill  . albuterol (VENTOLIN HFA) 108 (90 Base) MCG/ACT inhaler     . amLODipine (NORVASC) 5 MG tablet Take 1 tablet (5 mg total) by mouth daily. 30 tablet 2  . apixaban (ELIQUIS) 5 MG TABS tablet Take 5 mg by mouth 2 (two) times daily.    [name] aspirin EC 81 MG EC tablet Take 1 tablet (81 mg total) by mouth daily. 90 tablet 3  . atorvastatin (LIPITOR) 80 MG tablet Take 80 mg by mouth at bedtime.     [name] buPROPion (WELLBUTRIN XL) 150 MG 24 hr tablet Take 150 mg by mouth daily.     . clotrimazole-betamethasone (LOTRISONE) cream Apply 1 application topically 2 (two) times daily as needed (for dry skin).     [name] diphenhydrAMINE (BENADRYL) 25 MG tablet Take 1 tablet (25 mg total) by mouth every 8 (eight) hours as needed for itching or allergies. 20 tablet 0  . DULoxetine (CYMBALTA) 60 MG capsule Take 60 mg by mouth daily.    . ferrous sulfate 325 (65 FE) MG tablet Take 325 mg by mouth daily.    [name] gabapentin (NEURONTIN) 100 MG capsule  Take 1 capsule (100 mg total) by mouth 2 (two) times daily. 180 capsule 3  . lidocaine (LIDODERM) 5 % Place 1 patch onto the skin daily. Remove & Discard patch within 12 hours or as directed by MD 30 patch 0  . metoprolol tartrate (LOPRESSOR) 50 MG tablet Take 1 tablet (50 mg total) by mouth 2 (two) times daily. 30 tablet 0  . Multiple Vitamin (MULTIVITAMIN WITH MINERALS) TABS tablet Take 1 tablet by mouth daily. 90 tablet 3  . omeprazole (PRILOSEC) 20 MG capsule Take 20 mg by mouth 2 (two) times daily before a meal.    . ondansetron (ZOFRAN) 4 MG tablet Take 1 tablet (4 mg total) by mouth every 8 (eight) hours as needed. 20 tablet 0  . oxyCODONE (OXY IR/ROXICODONE) 5 MG immediate release tablet One to Two Tabs Every Six Hours As Needed For Pain 50 tablet 0  . topiramate (TOPAMAX) 25 MG tablet Take 25 mg by mouth 2 (two) times daily.      No current facility-administered medications for this visit.    Past Medical History:  Diagnosis Date  . Abdominal aortic atherosclerosis ([place])    a. 05/2017 CTA abd/pelvis: significant atherosclerotic dzs of the infrarenal abd Ao w/ some mural thrombus. No aneurysm or dissection.  . Abdominal  pain   . Acute focal ischemia of small intestine ([place])   . Acute right-sided low back pain with right-sided sciatica 06/13/2017  . AKI (acute kidney injury) ([place]) 07/29/2017  . Baker's cyst of knee, right    a. 07/2016 U/S: 4.1 x 1.4 x 2.9 cystic structure in R poplitetal fossa.  . Bell's palsy   . Borderline diabetes   . Cancer ([place])    skin cancer on nose  . Chest pain    a. 08/2012 Lexiscan MV: EF 54%, non ischemia/infarct.  . CHF (congestive heart failure) ([place])   . Chronic idiopathic constipation   . Chronic mesenteric ischemia ([place])   . Diastolic dysfunction    a. 05/2017 Echo: Ef 60-65%, no rwma, Gr1 DD, no source of cardiac emboli.  . 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 05/2017 CTA Abd/pelvis: apparent thrombus or embolus in prox SMA  (70-90%); b. 05/2017 catheter directed tPA, mechanical thrombectomy, and stenting of the SMA.  . Essential hypertension with goal blood pressure less than 140/90 01/19/2016  . Gastroesophageal reflux disease 01/19/2016  . GERD (gastroesophageal reflux disease)   . History of kidney stones   . Hyperlipidemia   . Hypertension   . Hypotension 07/29/2017  . Morbid obesity with BMI of 40.0-44.9, adult ([place])   . Occlusive mesenteric ischemia ([place]) 06/19/2017  . Personal history of other malignant neoplasm of skin 06/01/2016  . Primary osteoarthritis of both knees 06/13/2017  . Recurrent major depressive disorder ([place]) 01/19/2016  . SBO (small bowel obstruction) ([place]) 08/06/2017  . 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 Urinary tract infection 01/09/2014    Past Surgical History:  Procedure Laterality Date  . APPENDECTOMY    . CHOLECYSTECTOMY    . LAPAROTOMY N/A 08/08/2017   Procedure: EXPLORATORY LAPAROTOMY POSSIBLE BOWEL RESECTION;  Surgeon: Jules Husbands, MD;  Location: [facility];  Service: General;  Laterality: N/A;  . LOWER EXTREMITY ANGIOGRAPHY Left 11/17/2019   Procedure: LOWER EXTREMITY ANGIOGRAPHY;  Surgeon: Algernon Huxley, MD;  Location: [facility];  Service: Cardiovascular;  Laterality: Left;  . LOWER EXTREMITY INTERVENTION N/A 11/19/2019   Procedure: LOWER EXTREMITY INTERVENTION;  Surgeon: Algernon Huxley, MD;  Location: [facility];  Service: Cardiovascular;  Laterality: N/A;  . TEE WITHOUT CARDIOVERSION N/A 06/22/2017   Procedure: TRANSESOPHAGEAL ECHOCARDIOGRAM (TEE);  Surgeon: Wellington Hampshire, MD;  Location: [facility];  Service: Cardiovascular;  Laterality: N/A;  . TOTAL KNEE ARTHROPLASTY Right 07/11/2018   Procedure: TOTAL KNEE ARTHROPLASTY;  Surgeon: Corky Mull, MD;  Location: [facility];  Service: Orthopedics;  Laterality: Right;  [name] VAGINAL HYSTERECTOMY    . VISCERAL ARTERY INTERVENTION N/A 06/20/2017   Procedure: Visceral Artery Intervention, possible aortic  thrombectomy;  Surgeon: Algernon Huxley, MD;  Location: [facility];  Service: Cardiovascular;  Laterality: N/A;  . VISCERAL ARTERY INTERVENTION N/A 01/28/2018   Procedure: VISCERAL ARTERY INTERVENTION;  Surgeon: Algernon Huxley, MD;  Location: [facility];  Service: Cardiovascular;  Laterality: N/A;     Social History   Tobacco Use  . Smoking status: Current Every Day Smoker    Packs/day: 0.50    Types: Cigarettes  . Smokeless tobacco: Never Used  Substance Use Topics  . Alcohol use: No  . Drug use: No    Family History  Problem Relation Age of Onset  . Hypertension Mother   . Heart disease Mother   . Heart attack Mother   . Breast  cancer Mother   . Hypertension Father   . Breast cancer Paternal Aunt     Allergies  Allergen Reactions  . Bactrim [Sulfamethoxazole-Trimethoprim] Itching     REVIEW OF SYSTEMS (Negative unless checked)  Constitutional: [] Weight loss  [] Fever  [] Chills Cardiac: [] Chest pain   [] Chest pressure   [] Palpitations   [] Shortness of breath when laying flat   [] Shortness of breath at rest   [] Shortness of breath with exertion. Vascular:  [] Pain in legs with walking   [] Pain in legs at rest   [] Pain in legs when laying flat   [] Claudication   [x] Pain in feet when walking  [x] Pain in feet at rest  [x] Pain in feet when laying flat   [] History of DVT   [] Phlebitis   [] Swelling in legs   [] Varicose veins   [] Non-healing ulcers Pulmonary:   [] Uses home oxygen   [] Productive cough   [] Hemoptysis   [] Wheeze  [] COPD   [] Asthma Neurologic:  [] Dizziness  [] Blackouts   [] Seizures   [] History of stroke   [] History of TIA  [] Aphasia   [] Temporary blindness   [] Dysphagia   [] Weakness or numbness in arms   [] Weakness or numbness in legs Musculoskeletal:  [] Arthritis   [] Joint swelling   [] Joint pain   [] Low back pain Hematologic:  [] Easy bruising  [] Easy bleeding   [] Hypercoagulable state   [] Anemic   Gastrointestinal:  [] Blood in stool   [] Vomiting blood   [] Gastroesophageal reflux/heartburn   [x] Abdominal pain Genitourinary:  [] Chronic kidney disease   [] Difficult urination  [] Frequent urination  [] Burning with urination   [] Hematuria Skin:  [] Rashes   [] Ulcers   [] Wounds Psychological:  [] History of anxiety   []  History of major depression.  Physical Examination  BP (!) 158/98 (BP Location: Right Arm)   Pulse 96   Resp 20   Ht 5\' 2"  (1.575 m)   Wt 243 lb (110.2 kg)   BMI 44.45 kg/m  Gen:  WD/WN, NAD Head: [place]/AT, No temporalis wasting. Ear/Nose/Throat: Hearing grossly intact, nares w/o erythema or drainage Eyes: Conjunctiva clear. Sclera non-icteric Neck: Supple.  Trachea midline Pulmonary:  Good air movement, no use of accessory muscles.  Cardiac: RRR, no JVD Vascular:  Vessel Right Left  Radial Palpable Palpable                          PT Palpable  1+ palpable  DP Palpable  1+ palpable   Gastrointestinal: soft, non-tender/non-distended. No guarding/reflex.  Musculoskeletal: M/S 5/5 throughout.  No deformity or atrophy.  Mild left lower extremity edema.  The tip of the left third toe is dark and discolored.  There is diminished capillary refill on the distal half of the toe. Neurologic: Sensation grossly intact in extremities.  Symmetrical.  Speech is fluent.  Psychiatric: Judgment intact, Mood & affect appropriate for pt's clinical situation. Dermatologic: No rashes or ulcers noted.  No cellulitis or open wounds.       Labs Recent Results (from the past 2160 hour(s))  CBC with Differential     Status: Abnormal   Collection Time: 11/07/19 11:04 AM  Result Value Ref Range   WBC 13.8 (H) 4.0 - 10.5 K/uL   RBC 5.30 (H) 3.87 - 5.11 MIL/uL   Hemoglobin 15.8 (H) 12.0 - 15.0 g/dL   HCT 49.4 (H) 36.0 - 46.0 %   MCV 93.2 80.0 - 100.0 fL   MCH 29.8 26.0 - 34.0 pg   MCHC 32.0 30.0 - 36.0  g/dL   RDW 14.6 11.5 - 15.5 %   Platelets 254 150 - 400 K/uL   nRBC 0.0 0.0 - 0.2 %   Neutrophils Relative % 78 %   Neutro Abs 10.9  (H) 1.7 - 7.7 K/uL   Lymphocytes Relative 17 %   Lymphs Abs 2.3 0.7 - 4.0 K/uL   Monocytes Relative 4 %   Monocytes Absolute 0.5 0.1 - 1.0 K/uL   Eosinophils Relative 0 %   Eosinophils Absolute 0.0 0.0 - 0.5 K/uL   Basophils Relative 0 %   Basophils Absolute 0.0 0.0 - 0.1 K/uL   Immature Granulocytes 1 %   Abs Immature Granulocytes 0.08 (H) 0.00 - 0.07 K/uL    Comment: Performed at [facility], [address] [place], [place] [address]  Protime-INR     Status: None   Collection Time: 11/07/19 11:04 AM  Result Value Ref Range   Prothrombin Time 12.7 11.4 - 15.2 seconds   INR 1.0 0.8 - 1.2    Comment: (NOTE) INR goal varies based on device and disease states. Performed at [facility], [address] [place], [place] [address]   Comprehensive metabolic panel     Status: Abnormal   Collection Time: 11/07/19 11:04 AM  Result Value Ref Range   Sodium 136 135 - 145 mmol/L   Potassium 4.6 3.5 - 5.1 mmol/L   Chloride 103 98 - 111 mmol/L   CO2 21 (L) 22 - 32 mmol/L   Glucose, Bld 136 (H) 70 - 99 mg/dL   BUN 22 (H) 6 - 20 mg/dL   Creatinine, Ser 0.91 0.44 - 1.00 mg/dL   Calcium 9.3 8.9 - 10.3 mg/dL   Total Protein 8.0 6.5 - 8.1 g/dL   Albumin 3.8 3.5 - 5.0 g/dL   AST 10 (L) 15 - 41 U/L   ALT 14 0 - 44 U/L   Alkaline Phosphatase 85 38 - 126 U/L   Total Bilirubin 0.6 0.3 - 1.2 mg/dL   GFR calc non Af Amer >60 >60 mL/min   GFR calc Af Amer >60 >60 mL/min   Anion gap 12 5 - 15    Comment: Performed at [facility], [address] [place], [place] [address]  SARS CORONAVIRUS 2 (TAT 6-24 HRS) Nasopharyngeal Nasopharyngeal Swab     Status: Abnormal   Collection Time: 11/14/19 11:00 AM   Specimen: Nasopharyngeal Swab  Result Value Ref Range   SARS Coronavirus 2 POSITIVE (A) NEGATIVE    Comment: (NOTE) SARS-CoV-2 target nucleic acids are DETECTED. The SARS-CoV-2 RNA is generally detectable in upper and lower respiratory specimens during the acute  phase of infection. Positive results are indicative of the presence of SARS-CoV-2 RNA. Clinical correlation with patient history and other diagnostic information is  necessary to determine patient infection status. Positive results do not rule out bacterial infection or co-infection with other viruses.  The expected result is Negative. Fact Sheet for Patients: SugarRoll.be Fact Sheet for Healthcare Providers: https://www.woods-mathews.com/ This test is not yet approved or cleared by the [place] FDA and  has been authorized for detection and/or diagnosis of SARS-CoV-2 by FDA under an Emergency Use Authorization (EUA). This EUA will remain  in effect (meaning this test can be used) for the duration of the COVID-19 declaration under Section 564(b)(1) of the Act, 21 U.S.C. se ction 360bbb-3(b)(1), unless the authorization is terminated or revoked sooner. Performed at [facility], [place] [address] [place], [place] [address]   BUN  Status: None   Collection Time: 11/17/19  4:53 PM  Result Value Ref Range   BUN 17 6 - 20 mg/dL    Comment: Performed at [facility], [address] [place], [place] [address]  Creatinine, serum     Status: None   Collection Time: 11/17/19  4:53 PM  Result Value Ref Range   Creatinine, Ser 0.75 0.44 - 1.00 mg/dL   GFR calc non Af Amer >60 >60 mL/min   GFR calc Af Amer >60 >60 mL/min    Comment: Performed at [facility], [address] [place], [place] [id number]  Basic metabolic panel     Status: Abnormal   Collection Time: 11/18/19  4:50 AM  Result Value Ref Range   Sodium 139 135 - 145 mmol/L   Potassium 4.3 3.5 - 5.1 mmol/L   Chloride 109 98 - 111 mmol/L   CO2 25 22 - 32 mmol/L   Glucose, Bld 133 (H) 70 - 99 mg/dL   BUN 18 6 - 20 mg/dL   Creatinine, Ser 0.83 0.44 - 1.00 mg/dL   Calcium 7.9 (L) 8.9 - 10.3 mg/dL   GFR calc non Af Amer >60 >60 mL/min   GFR calc Af Amer >60  >60 mL/min   Anion gap 5 5 - 15    Comment: Performed at [facility], [address] [place], [place] [address]  CBC     Status: None   Collection Time: 11/18/19  4:50 AM  Result Value Ref Range   WBC 4.5 4.0 - 10.5 K/uL   RBC 4.10 3.87 - 5.11 MIL/uL   Hemoglobin 12.1 12.0 - 15.0 g/dL   HCT 39.9 36.0 - 46.0 %   MCV 97.3 80.0 - 100.0 fL   MCH 29.5 26.0 - 34.0 pg   MCHC 30.3 30.0 - 36.0 g/dL   RDW 14.3 11.5 - 15.5 %   Platelets 212 150 - 400 K/uL   nRBC 0.0 0.0 - 0.2 %    Comment: Performed at [facility], [address] [place], [place] [address]  Magnesium     Status: None   Collection Time: 11/18/19  4:50 AM  Result Value Ref Range   Magnesium 2.1 1.7 - 2.4 mg/dL    Comment: Performed at [facility], [address] [place], [place] [address]  CBC     Status: Abnormal   Collection Time: 11/19/19  4:19 AM  Result Value Ref Range   WBC 4.7 4.0 - 10.5 K/uL   RBC 3.80 (L) 3.87 - 5.11 MIL/uL   Hemoglobin 11.4 (L) 12.0 - 15.0 g/dL   HCT 37.5 36.0 - 46.0 %   MCV 98.7 80.0 - 100.0 fL   MCH 30.0 26.0 - 34.0 pg   MCHC 30.4 30.0 - 36.0 g/dL   RDW 14.2 11.5 - 15.5 %   Platelets 180 150 - 400 K/uL   nRBC 0.0 0.0 - 0.2 %    Comment: Performed at [facility], [address] [place], [place] [id number]  Basic metabolic panel     Status: Abnormal   Collection Time: 11/19/19  4:19 AM  Result Value Ref Range   Sodium 144 135 - 145 mmol/L   Potassium 4.3 3.5 - 5.1 mmol/L   Chloride 110 98 - 111 mmol/L   CO2 25 22 - 32 mmol/L   Glucose, Bld 126 (H) 70 - 99 mg/dL   BUN 20 6 - 20 mg/dL   Creatinine, Ser 0.97 0.44 -  1.00 mg/dL   Calcium 8.3 (L) 8.9 - 10.3 mg/dL   GFR calc non Af Amer >60 >60 mL/min   GFR calc Af Amer >60 >60 mL/min   Anion gap 9 5 - 15    Comment: Performed at [facility], [address] [place], [place] [address]  Magnesium     Status: None   Collection Time: 11/19/19  4:19 AM  Result Value Ref Range    Magnesium 2.2 1.7 - 2.4 mg/dL    Comment: Performed at [facility], [address] [place], [place] [address]  Hemoglobin A1c     Status: Abnormal   Collection Time: 11/19/19  4:19 AM  Result Value Ref Range   Hgb A1c MFr Bld 6.3 (H) 4.8 - 5.6 %    Comment: (NOTE) Pre diabetes:          5.7%-6.4% Diabetes:              >6.4% Glycemic control for   <7.0% adults with diabetes    Mean Plasma Glucose 134.11 mg/dL    Comment: Performed at [facility], [place] [address] [place], [place] [address]  Glucose, capillary     Status: Abnormal   Collection Time: 11/19/19 10:04 PM  Result Value Ref Range   Glucose-Capillary 183 (H) 70 - 99 mg/dL  CBC     Status: Abnormal   Collection Time: 11/20/19  3:49 AM  Result Value Ref Range   WBC 5.8 4.0 - 10.5 K/uL   RBC 3.56 (L) 3.87 - 5.11 MIL/uL   Hemoglobin 10.8 (L) 12.0 - 15.0 g/dL   HCT 34.7 (L) 36.0 - 46.0 %   MCV 97.5 80.0 - 100.0 fL   MCH 30.3 26.0 - 34.0 pg   MCHC 31.1 30.0 - 36.0 g/dL   RDW 14.0 11.5 - 15.5 %   Platelets 174 150 - 400 K/uL   nRBC 0.0 0.0 - 0.2 %    Comment: Performed at [facility], [address] [place], [place] [id number]  Basic metabolic panel     Status: Abnormal   Collection Time: 11/20/19  3:49 AM  Result Value Ref Range   Sodium 140 135 - 145 mmol/L   Potassium 4.3 3.5 - 5.1 mmol/L   Chloride 107 98 - 111 mmol/L   CO2 25 22 - 32 mmol/L   Glucose, Bld 104 (H) 70 - 99 mg/dL   BUN 15 6 - 20 mg/dL   Creatinine, Ser 0.82 0.44 - 1.00 mg/dL   Calcium 8.1 (L) 8.9 - 10.3 mg/dL   GFR calc non Af Amer >60 >60 mL/min   GFR calc Af Amer >60 >60 mL/min   Anion gap 8 5 - 15    Comment: Performed at [facility], [address] [place], [place] [address]  CBC     Status: Abnormal   Collection Time: 11/21/19  8:16 AM  Result Value Ref Range   WBC 5.4 4.0 - 10.5 K/uL   RBC 3.63 (L) 3.87 - 5.11 MIL/uL   Hemoglobin 10.8 (L) 12.0 - 15.0 g/dL   HCT 34.3 (L) 36.0 - 46.0 %   MCV 94.5  80.0 - 100.0 fL   MCH 29.8 26.0 - 34.0 pg   MCHC 31.5 30.0 - 36.0 g/dL   RDW 13.6 11.5 - 15.5 %   Platelets 210 150 - 400 K/uL   nRBC 0.0 0.0 - 0.2 %    Comment: Performed at [facility], [address] [place], [place] [id number]  Basic metabolic panel     Status: Abnormal   Collection Time: 11/21/19  8:16 AM  Result Value Ref Range   Sodium 140 135 - 145 mmol/L   Potassium 4.1 3.5 - 5.1 mmol/L   Chloride 109 98 - 111 mmol/L   CO2 23 22 - 32 mmol/L   Glucose, Bld 117 (H) 70 - 99 mg/dL   BUN 13 6 - 20 mg/dL   Creatinine, Ser 0.80 0.44 - 1.00 mg/dL   Calcium 8.3 (L) 8.9 - 10.3 mg/dL   GFR calc non Af Amer >60 >60 mL/min   GFR calc Af Amer >60 >60 mL/min   Anion gap 8 5 - 15    Comment: Performed at [facility], [address] [place], [place] [address]  Magnesium     Status: None   Collection Time: 11/21/19  8:16 AM  Result Value Ref Range   Magnesium 2.2 1.7 - 2.4 mg/dL    Comment: Performed at [facility], [address] [place], [place] [address]  Comprehensive metabolic panel     Status: Abnormal   Collection Time: 11/23/19  1:30 PM  Result Value Ref Range   Sodium 139 135 - 145 mmol/L   Potassium 3.9 3.5 - 5.1 mmol/L   Chloride 108 98 - 111 mmol/L   CO2 21 (L) 22 - 32 mmol/L   Glucose, Bld 118 (H) 70 - 99 mg/dL   BUN 10 6 - 20 mg/dL   Creatinine, Ser 0.83 0.44 - 1.00 mg/dL   Calcium 8.1 (L) 8.9 - 10.3 mg/dL   Total Protein 6.2 (L) 6.5 - 8.1 g/dL   Albumin 3.1 (L) 3.5 - 5.0 g/dL   AST 18 15 - 41 U/L   ALT 16 0 - 44 U/L   Alkaline Phosphatase 105 38 - 126 U/L   Total Bilirubin 0.3 0.3 - 1.2 mg/dL   GFR calc non Af Amer >60 >60 mL/min   GFR calc Af Amer >60 >60 mL/min   Anion gap 10 5 - 15    Comment: Performed at [facility], [address] [place], [place] [address]  CBC with Differential     Status: Abnormal   Collection Time: 11/23/19  1:30 PM  Result Value Ref Range   WBC 6.0 4.0 - 10.5 K/uL   RBC 3.64 (L) 3.87 -  5.11 MIL/uL   Hemoglobin 10.9 (L) 12.0 - 15.0 g/dL   HCT 34.8 (L) 36.0 - 46.0 %   MCV 95.6 80.0 - 100.0 fL   MCH 29.9 26.0 - 34.0 pg   MCHC 31.3 30.0 - 36.0 g/dL   RDW 13.8 11.5 - 15.5 %   Platelets 288 150 - 400 K/uL   nRBC 0.0 0.0 - 0.2 %   Neutrophils Relative % 66 %   Neutro Abs 4.0 1.7 - 7.7 K/uL   Lymphocytes Relative 23 %   Lymphs Abs 1.4 0.7 - 4.0 K/uL   Monocytes Relative 8 %   Monocytes Absolute 0.5 0.1 - 1.0 K/uL   Eosinophils Relative 2 %   Eosinophils Absolute 0.1 0.0 - 0.5 K/uL   Basophils Relative 0 %   Basophils Absolute 0.0 0.0 - 0.1 K/uL   Immature Granulocytes 1 %   Abs Immature Granulocytes 0.04 0.00 - 0.07 K/uL    Comment: Performed at [facility], [address] [place], [place] [address]  Protime-INR     Status: None   Collection Time: 11/23/19  1:30 PM  Result Value Ref Range   Prothrombin Time 13.9 11.4 - 15.2 seconds   INR 1.1 0.8 - 1.2    Comment: (NOTE) INR goal varies based on device and disease states. Performed at [facility], [address] [place], [place] [address]   APTT     Status: None   Collection Time: 11/23/19  1:30 PM  Result Value Ref Range   aPTT 32 24 - 36 seconds    Comment: Performed at [facility], [address], [place], [place] [address]    Radiology DG Wrist Complete Right  Result Date: 10/26/2019 CLINICAL DATA:  Wrist pain EXAM: RIGHT WRIST - COMPLETE 3+ VIEW COMPARISON:  None. FINDINGS: No fracture or malalignment. Moderate arthritis at the first [facility] joint. Mild soft tissue swelling is present IMPRESSION: No acute osseous abnormality.  Arthritis at the first [facility] joint Electronically Signed   By: Donavan Foil M.D.   On: 10/26/2019 20:23   PERIPHERAL VASCULAR CATHETERIZATION  Result Date: 11/19/2019 See op note  PERIPHERAL VASCULAR CATHETERIZATION  Result Date: 11/17/2019 See op note  US Venous Img Lower  Left (DVT Study)  Result Date: 11/23/2019 CLINICAL DATA:  Left leg pain  and swelling. EXAM: LEFT LOWER EXTREMITY VENOUS DOPPLER ULTRASOUND TECHNIQUE: Gray-scale sonography with compression, as well as color and duplex ultrasound, were performed to evaluate the deep venous system(s) from the level of the common femoral vein through the popliteal and proximal calf veins. COMPARISON:  None. FINDINGS: VENOUS Normal compressibility of the common femoral, superficial femoral, and popliteal veins, as well as the visualized calf veins. Visualized portions of profunda femoral vein and great saphenous vein unremarkable. No filling defects to suggest DVT on grayscale or color Doppler imaging. Doppler waveforms show normal direction of venous flow, normal respiratory phasicity and response to augmentation. Limited views of the contralateral common femoral vein are unremarkable. OTHER Incidental note is made a small Baker cyst in popliteal fossa which measures 2.4 x 1.0 x 2 1 cm. Limitations: none IMPRESSION: No evidence of left lower extremity DVT. 2.4 cm Baker cyst in the left popliteal fossa. Electronically Signed   By: Marlaine Hind M.D.   On: 11/23/2019 14:18   US Venous Img Lower Unilateral Left (DVT)  Result Date: 11/07/2019 CLINICAL DATA:  Left lower extremity pain.  Cold left foot and calf. EXAM: LEFT LOWER EXTREMITY VENOUS DOPPLER ULTRASOUND TECHNIQUE: Gray-scale sonography with graded compression, as well as color Doppler and duplex ultrasound were performed to evaluate the lower extremity deep venous systems from the level of the common femoral vein and including the common femoral, femoral, profunda femoral, popliteal and calf veins including the posterior tibial, peroneal and gastrocnemius veins when visible. The superficial great saphenous vein was also interrogated. Spectral Doppler was utilized to evaluate flow at rest and with distal augmentation maneuvers in the common femoral, femoral and popliteal veins. COMPARISON:  02/14/2015 FINDINGS: Contralateral Common Femoral Vein:  Respiratory phasicity is normal and symmetric with the symptomatic side. No evidence of thrombus. Normal compressibility. Common Femoral Vein: No evidence of thrombus. Normal compressibility, respiratory phasicity and response to augmentation. Saphenofemoral Junction: No evidence of thrombus. Normal compressibility and flow on color Doppler imaging. Profunda Femoral Vein: No evidence of thrombus. Normal compressibility and flow on color Doppler imaging. Femoral Vein: No evidence of thrombus. Normal compressibility, respiratory phasicity and response to augmentation. Popliteal Vein: No evidence of thrombus. Normal compressibility, respiratory phasicity and response to augmentation. Calf Veins: No evidence of thrombus. Normal compressibility  and flow on color Doppler imaging. Other Findings: Left common femoral artery, left popliteal artery, left posterior tibial artery and left anterior tibial artery are patent with normal Doppler waveforms. IMPRESSION: 1.  Negative for deep venous thrombosis in left lower extremity. 2. Visualized left lower extremity arteries are patent with normal Doppler waveforms. Electronically Signed   By: Markus Daft M.D.   On: 11/07/2019 12:25   DG Foot Complete Left  Result Date: 11/07/2019 CLINICAL DATA:  Pain at the second-fourth toes EXAM: LEFT FOOT - COMPLETE 3+ VIEW COMPARISON:  06/24/2008 FINDINGS: There is no evidence of fracture or dislocation. Normal bony mineralization. Small os navicular. No cortical destruction or periostitis. Prominent plantar calcaneal spur. There is no evidence of arthropathy or other focal bone abnormality. Soft tissues are unremarkable. IMPRESSION: No acute osseous abnormality of the left foot. Electronically Signed   By: Davina Poke D.O.   On: 11/07/2019 14:00   VAS [place] ABI WITH/WO TBI  Result Date: 11/14/2019 LOWER EXTREMITY DOPPLER STUDY Indications: Rest pain, and Painful cold Lt Foot.  Performing Technologist: Almira Coaster RVS  Examination  Guidelines: A complete evaluation includes at minimum, Doppler waveform signals and systolic blood pressure reading at the level of bilateral brachial, anterior tibial, and posterior tibial arteries, when vessel segments are accessible. Bilateral testing is considered an integral part of a complete examination. Photoelectric Plethysmograph (PPG) waveforms and toe systolic pressure readings are included as required and additional duplex testing as needed. Limited examinations for reoccurring indications may be performed as noted.  ABI Findings: +---------+------------------+-----+---------+--------+ Right    Rt Pressure (mmHg)IndexWaveform Comment  +---------+------------------+-----+---------+--------+ Brachial 138                                      +---------+------------------+-----+---------+--------+ ATA      147               1.04 triphasic         +---------+------------------+-----+---------+--------+ PTA      139               0.99 triphasic         +---------+------------------+-----+---------+--------+ Great Toe132               0.94 Normal            +---------+------------------+-----+---------+--------+ +---------+------------------+-----+---------+-------+ Left     Lt Pressure (mmHg)IndexWaveform Comment +---------+------------------+-----+---------+-------+ Brachial 141                                     +---------+------------------+-----+---------+-------+ ATA      141               1.00 triphasic        +---------+------------------+-----+---------+-------+ PTA      143               1.01 triphasic        +---------+------------------+-----+---------+-------+ Great Toe62                0.44 Abnormal         +---------+------------------+-----+---------+-------+ +-------+-----------+-----------+------------+------------+ ABI/TBIToday's ABIToday's TBIPrevious ABIPrevious TBI  +-------+-----------+-----------+------------+------------+ Right  1.04       .94                                 +-------+-----------+-----------+------------+------------+  Left   1.01       .44                                 +-------+-----------+-----------+------------+------------+ TOES Findings: +----------+---------------+--------+-------+ Right ToesPressure (mmHg)WaveformComment +----------+---------------+--------+-------+ 1st Digit 49             Normal          +----------+---------------+--------+-------+ 2nd Digit 45             Normal          +----------+---------------+--------+-------+ 3rd Digit 24             Normal          +----------+---------------+--------+-------+ 4th Digit 26             Normal          +----------+---------------+--------+-------+ 5th Digit 18             Normal          +----------+---------------+--------+-------+  +---------+---------------+--------+-------+ Left ToesPressure (mmHg)WaveformComment +---------+---------------+--------+-------+ 1st Digit3              Abnormal        +---------+---------------+--------+-------+ 2nd Digit1              Absent          +---------+---------------+--------+-------+ 3rd Digit2              Abnormal        +---------+---------------+--------+-------+ 4th Digit0              Absent          +---------+---------------+--------+-------+ 5th Digit1              Absent          +---------+---------------+--------+-------+    Summary: Right: Resting right ankle-brachial index is within normal range. No evidence of significant right lower extremity arterial disease. The right toe-brachial index is normal. Left: Resting left ankle-brachial index is within normal range. No evidence of significant left lower extremity arterial disease. The left toe-brachial index is abnormal.  *See table(s) above for measurements and observations.  Electronically signed by [name] MD  on 11/14/2019 at 12:27:45 PM.    Final    CT Angio Abd/Pel w/ and/or w/o  Addendum Date: 11/18/2019   ADDENDUM REPORT: 11/18/2019 16:59 ADDENDUM: These results were called by telephone at the time of interpretation on 11/18/2019 at 4:59 pm to provider [facility] , who verbally acknowledged these results. Electronically Signed   By: Jerilynn Mages.  Shick M.D.   On: 11/18/2019 16:59   Result Date: 11/18/2019 CLINICAL DATA:  Peripheral vascular disease, right groin pain and hematoma on exam status post right femoral access for left lower extremity intervention yesterday EXAM: CTA ABDOMEN AND PELVIS WITH CONTRAST TECHNIQUE: Multidetector CT imaging of the abdomen and pelvis was performed using the standard protocol during bolus administration of intravenous contrast. Multiplanar reconstructed images and MIPs were obtained and reviewed to evaluate the vascular anatomy. CONTRAST:  16mL OMNIPAQUE IOHEXOL 350 MG/ML SOLN COMPARISON:  11/10/2018 FINDINGS: VASCULAR Aorta: Stable atherosclerotic changes of the aorta most pronounced of the infrarenal segment without aneurysm, dissection, acute occlusion or retroperitoneal hemorrhage/hematoma. Celiac: Widely patent origin including its branches. SMA: Previously stented origin appears chronically occluded. SMA is reconstituted below the stent via mesenteric collateral pathways. Renals: Minor atherosclerotic change but renal arteries remain widely patent. Patent accessory renal artery to the left lower pole noted. IMA:  Origin remains patent off the distal aorta including its branches. Inflow: Iliac atherosclerotic changes noted. The right common, internal and external iliac arteries remain patent. The left common iliac artery has a tubular nearly occlusive hypodense filling defect, images 133 through 143 series 4. This may represent vascular injury, thrombus, or dissection. Below this, the left common iliac artery, internal iliac artery and external iliac artery are patent.  Proximal Outflow: The common femoral, proximal profunda femoral, and proximal superficial femoral arteries demonstrated bilaterally appear patent. Postop changes noted in the right inguinal region without significant acute hematoma. Veins: No veno-occlusive process in the abdomen. Review of the MIP images confirms the above findings. NON-VASCULAR Lower chest: Dependent basilar atelectasis. Hepatobiliary: Remote cholecystectomy. Stable common bile duct dilatation related to post cholecystectomy effect. Mild hepatic steatosis suspected. No large focal hepatic abnormality. Patent portal vein. Pancreas: Unremarkable. No pancreatic ductal dilatation or surrounding inflammatory changes. Spleen: Normal in size without focal abnormality. Adrenals/Urinary Tract: Stable nodular thickening of the adrenal glands suspicious for hyperplasia or small adenomas. No focal renal abnormality or mass. No hydronephrosis or obstruction. Ureters are symmetric and decompressed. No obstructing ureteral calculus. Bladder is collapsed. Stomach/Bowel: Negative for bowel obstruction, significant dilatation, ileus, or free air. No fluid collection, hemorrhage, hematoma, abscess, or ascites. Lymphatic: No bulky abdominopelvic adenopathy. No large inguinal adenopathy. Reproductive: Remote hysterectomy. No adnexal abnormality or mass. No pelvic free fluid or ascites. Other: Chronic large right lower quadrant abdominal wall hernia with protrusion of colon and small bowel within the hernia defect without associated obstruction or incarceration. Musculoskeletal: Degenerative changes noted of the spine. Facet arthropathy of the lower lumbar spine. No acute osseous finding. IMPRESSION: VASCULAR Left common iliac artery intraluminal hypodense tubular filling defect appearing nearly occlusive which may be related to vascular injury, thrombus formation or dissection. Chronically occluded SMA origin stent with distal SMA reconstitution via mesenteric  collateral pathways. Celiac and IMA vasculature remain widely patent. 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 No acute abdominopelvic retroperitoneal hematoma or right inguinal hematoma related to the recent vascular intervention. NON-VASCULAR Stable remote postoperative findings. Mild hepatic steatosis. Large right lower abdominal ventral hernia containing bowel without incarceration or obstruction. Electronically Signed: By: Jerilynn Mages.  Shick M.D. On: 11/18/2019 16:02    Assessment/Plan  Paroxysmal atrial fibrillation (HCC) The foot issue is likely a thromboembolic issue.  She remains on anticoagulation and should stay on this indefinitely.  Essential hypertension with goal blood pressure less than 140/90 blood pressure control important in reducing the progression of atherosclerotic disease. On appropriate oral medications.   Ventral hernia with obstruction and without gangrene Seen on CT scan while in the hospital as well.  Has been referred to general surgery for consideration for repair in the future.  Hyperlipidemia lipid control important in reducing the progression of atherosclerotic disease. Continue statin therapy   Ischemia of left lower extremity Had 2 revascularization procedures performed last week.  At this point, her flow seems to be restored as much as possible.  I would continue anticoagulation.  She is seeing podiatry tomorrow and I think she will need to have her third toe amputated.  We will plan to see her back in several weeks with ABIs.    [name], MD  11/25/2019 2:23 PM    This note was created with Dragon medical transcription system.  Any errors from dictation are purely unintentional

## 2019-11-25 NOTE — Assessment & Plan Note (Signed)
Seen on CT scan while in the hospital as well.  Has been referred to general surgery for consideration for repair in the future.

## 2019-11-25 NOTE — Assessment & Plan Note (Signed)
blood pressure control important in reducing the progression of atherosclerotic disease. On appropriate oral medications.  

## 2019-11-25 NOTE — Assessment & Plan Note (Signed)
Had 2 revascularization procedures performed last week.  At this point, her flow seems to be restored as much as possible.  I would continue anticoagulation.  She is seeing podiatry tomorrow and I think she will need to have her third toe amputated.  We will plan to see her back in several weeks with ABIs.

## 2019-11-25 NOTE — Assessment & Plan Note (Signed)
The foot issue is likely a thromboembolic issue.  She remains on anticoagulation and should stay on this indefinitely.

## 2019-11-28 IMAGING — CR DG CHEST 2V
2 series · 2 of 2 positions shown · non-contrast
Comparison: Radiographs May 13, 2018.

CLINICAL DATA: Chest pain.

EXAM:
CHEST - 2 VIEW

[chest pa]
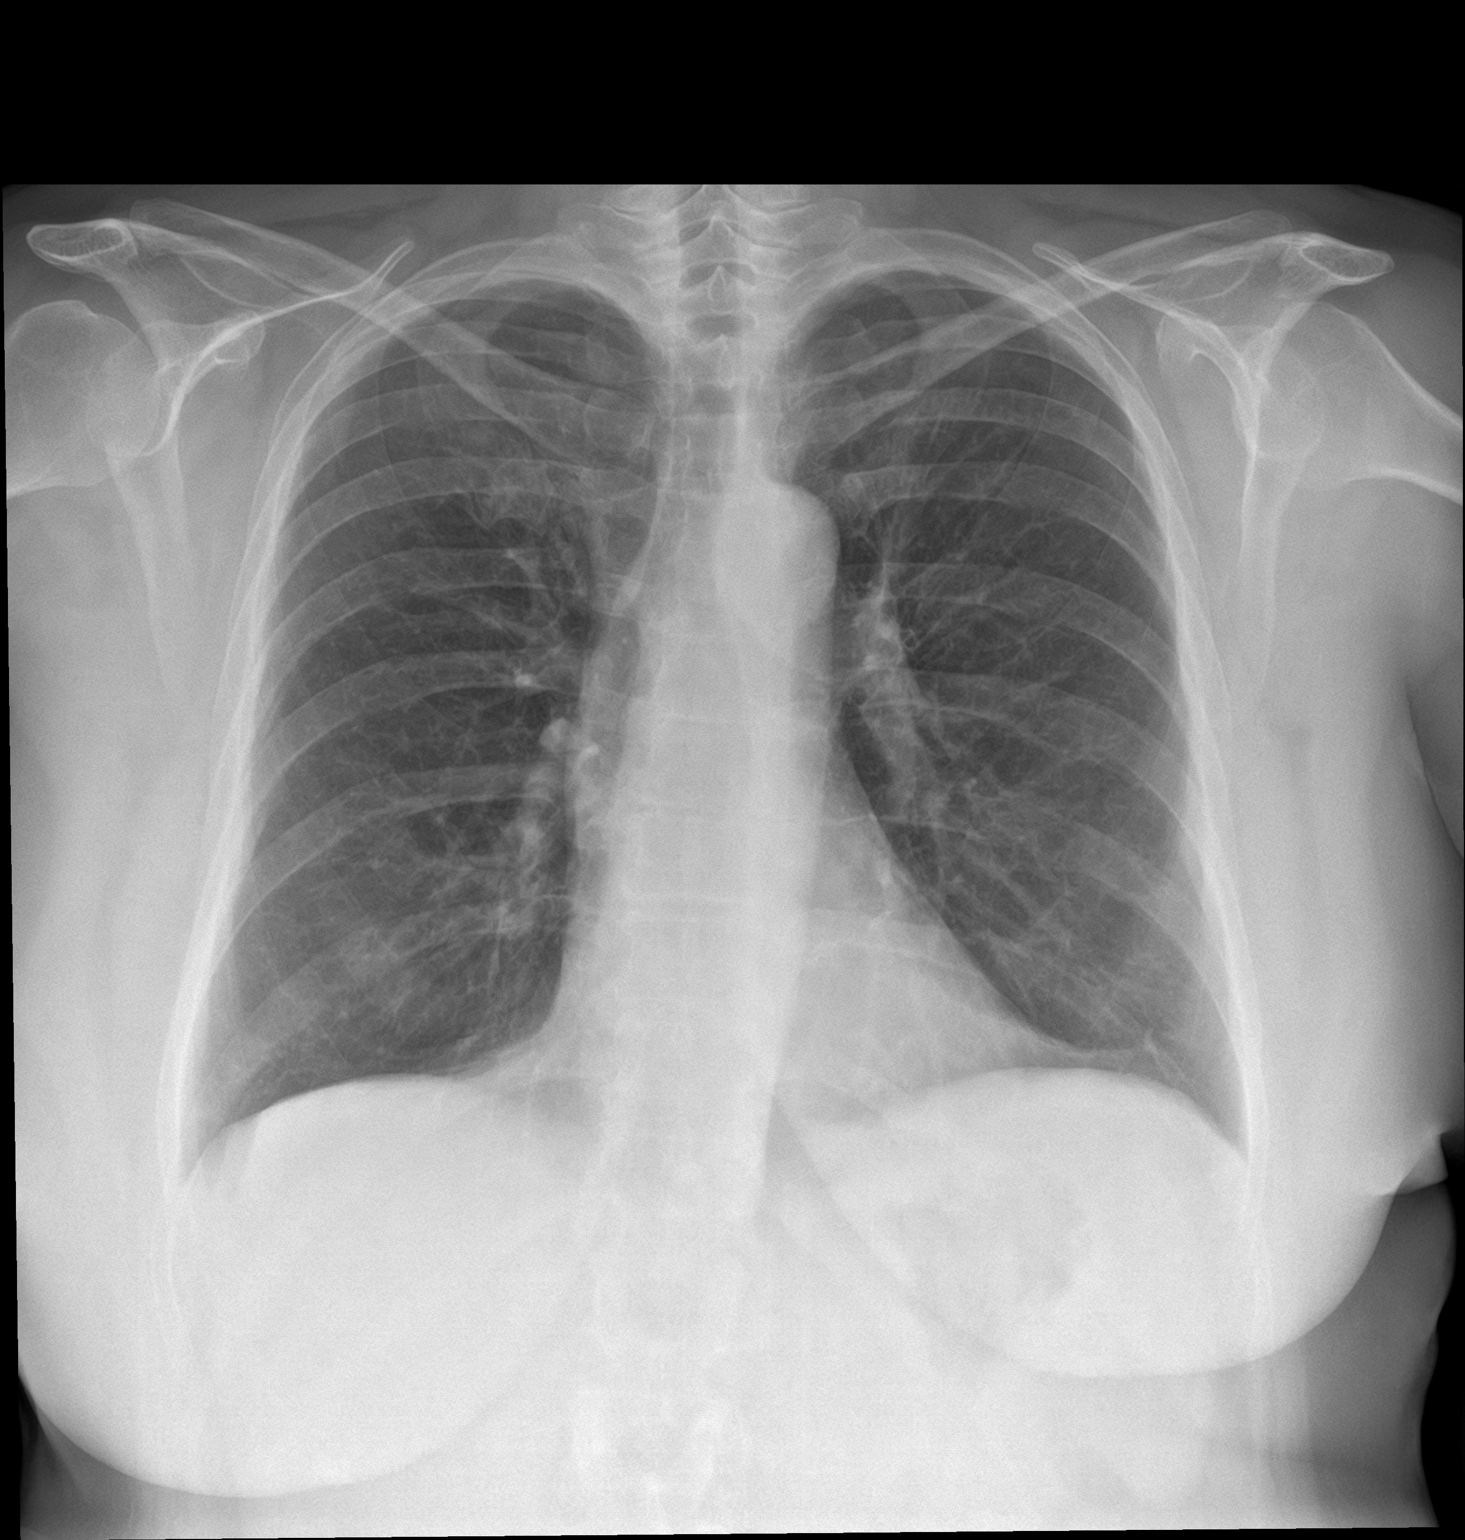

[chest lat]
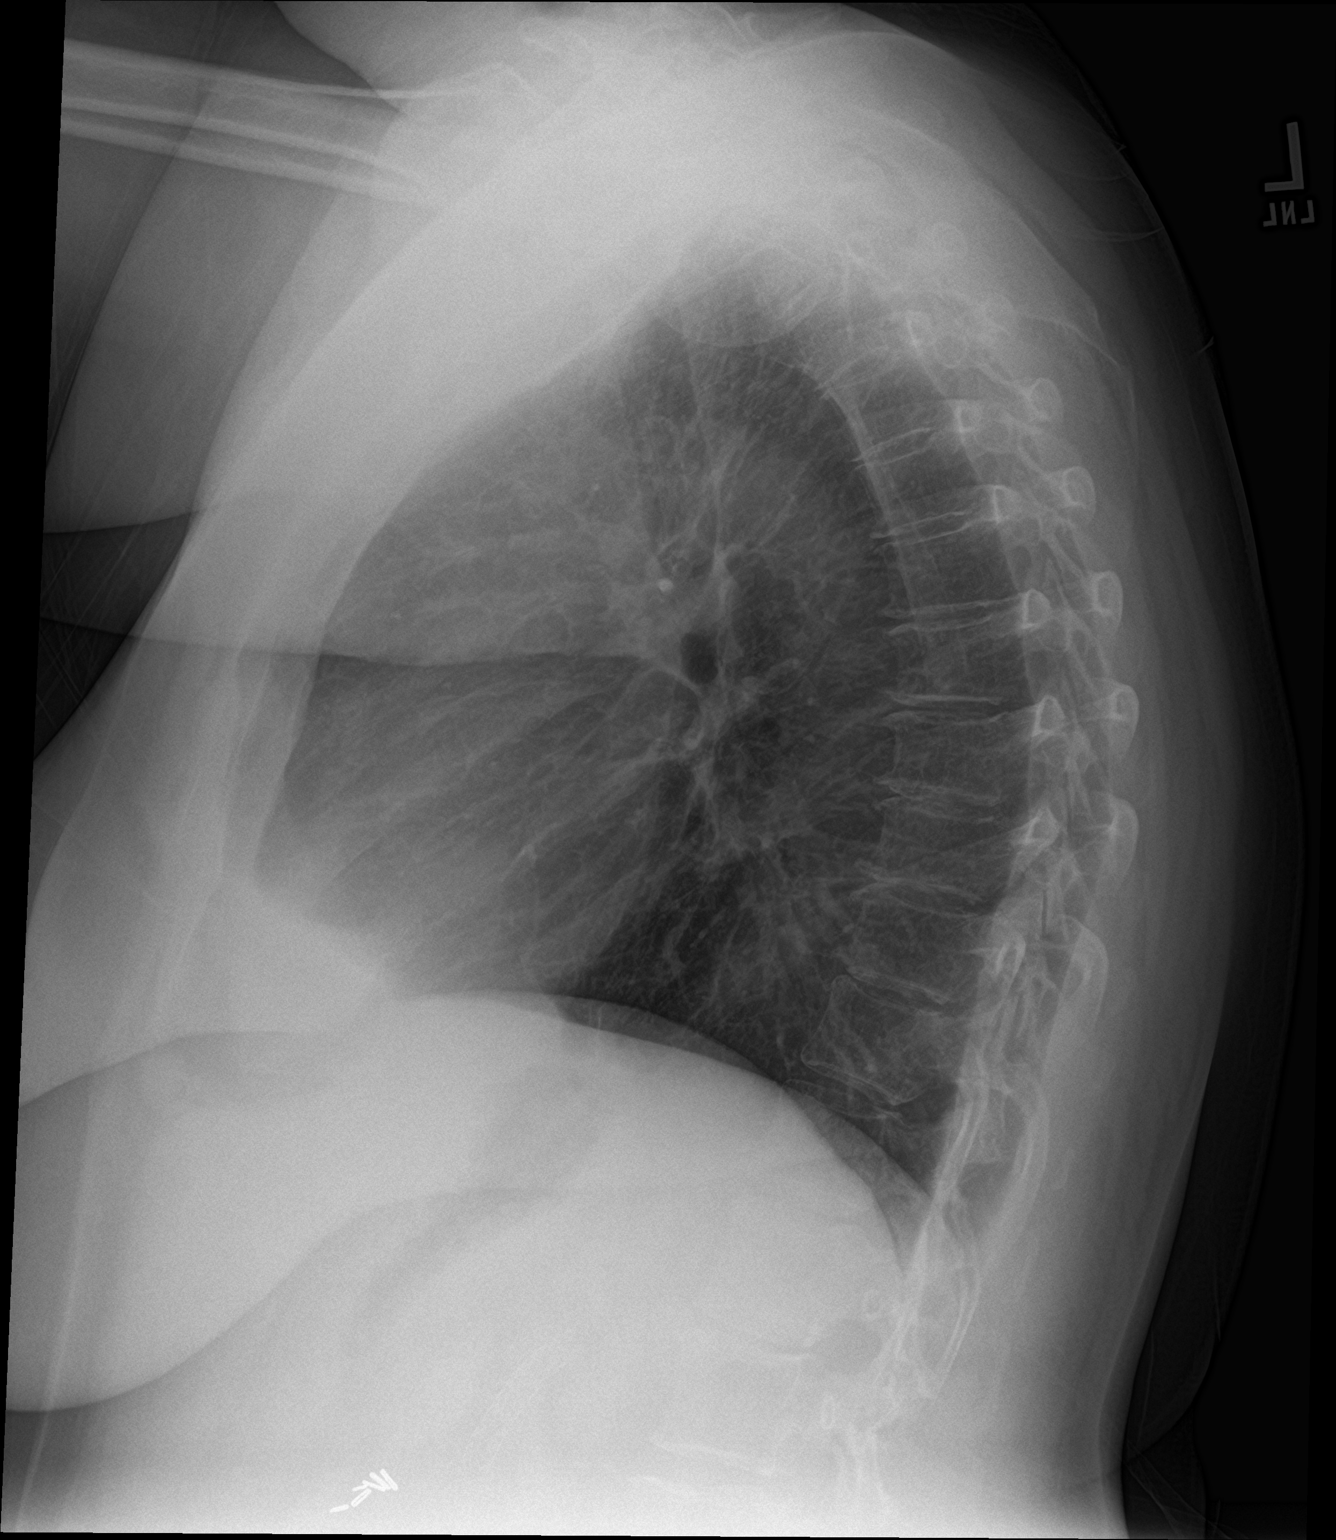

[2 of 2 positions shown; findings below may reference images not displayed]

FINDINGS: The heart size and mediastinal contours are within normal limits.
Both lungs are clear. No pneumothorax or pleural effusion is noted.
The visualized skeletal structures are unremarkable.
IMPRESSION: No active cardiopulmonary disease.

## 2019-12-01 DIAGNOSIS — G8929 Other chronic pain: Secondary | ICD-10-CM | POA: Insufficient documentation

## 2019-12-01 DIAGNOSIS — M7551 Bursitis of right shoulder: Secondary | ICD-10-CM | POA: Insufficient documentation

## 2019-12-01 DIAGNOSIS — M8949 Other hypertrophic osteoarthropathy, multiple sites: Secondary | ICD-10-CM | POA: Insufficient documentation

## 2019-12-01 DIAGNOSIS — M159 Polyosteoarthritis, unspecified: Secondary | ICD-10-CM | POA: Insufficient documentation

## 2019-12-03 ENCOUNTER — Other Ambulatory Visit: Payer: Self-pay | Admitting: Podiatry

## 2019-12-03 IMAGING — CT CT CTA ABD/PEL W/CM AND/OR W/O CM
3 of 10 series · 11 of 46 positions shown, 16 images · IV contrast (iopamidol)
Comparison: 01/24/2018 and previous

CLINICAL DATA: C/o severe abdominal hernia, pt states swollen
abdomen x1yr is hard to the touch, diffuse abdominal pain, states
large portion of colon removed 1yr ago, states hospitalized 2wks ago
for heart. Hx COPD, AFIB

EXAM:
CTA ABDOMEN AND PELVIS WITH CONTRAST
TECHNIQUE: Multidetector CT imaging of the abdomen and pelvis was performed
using the standard protocol during bolus administration of
intravenous contrast. Multiplanar reconstructed images and MIPs were
obtained and reviewed to evaluate the vascular anatomy.
CONTRAST:  100mL 3RIK3C-ENC IOPAMIDOL (3RIK3C-ENC) INJECTION 76%

[Series 7: cor art st cta arterial · coronal · arterial · 0.75mm/px · 2 of 155 slices shown]
[im 52/155  soft-tissue]
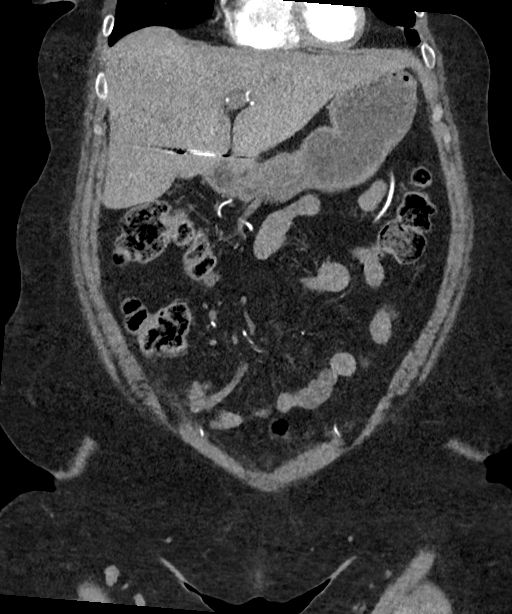
[im 103/155  soft-tissue]
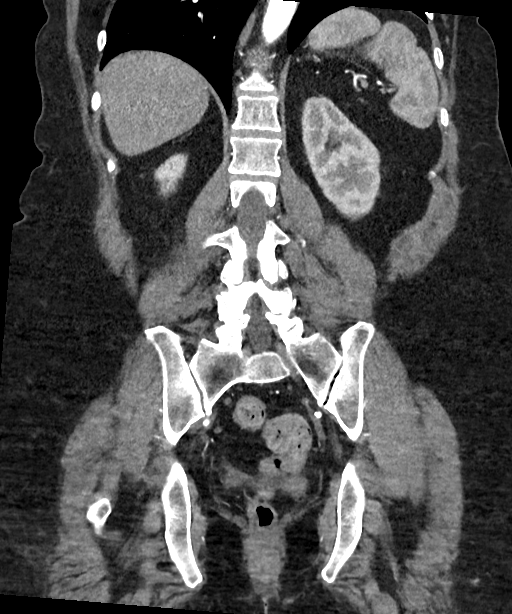

[Series 9: sag art st cta arterial · sagittal · arterial · 0.61mm/px · 1 of 192 slices shown]
[im 6/192  soft-tissue]
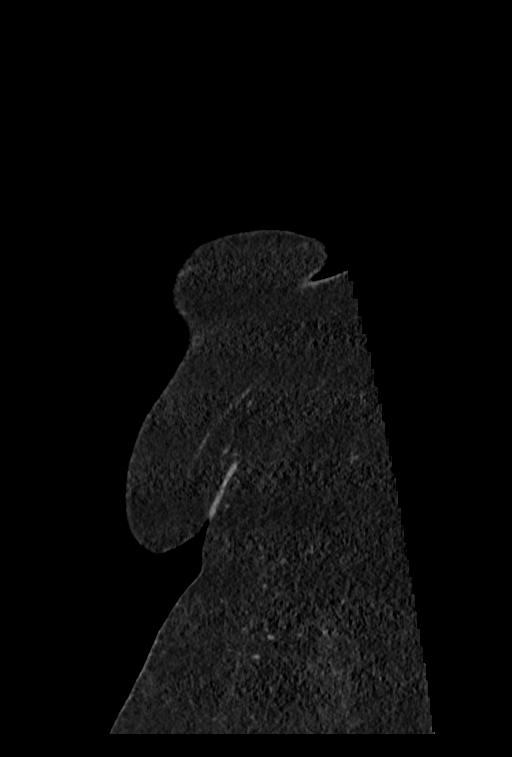

[Series 15: axial st cta venous · axial · portal-venous · 0.75mm/px · z∈[-1596,-1240]mm · 8 of 230 slices shown, 13 images]
[im 26/230  soft-tissue]
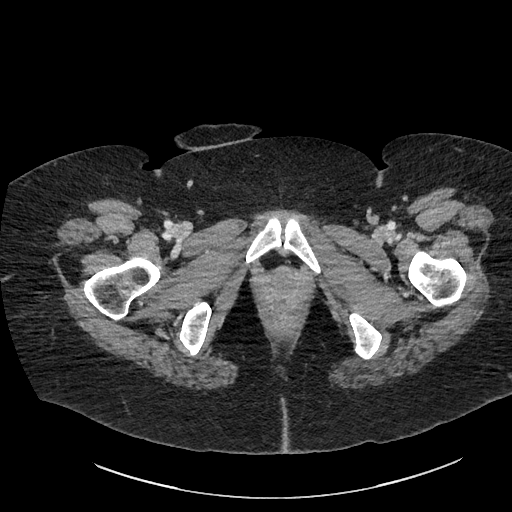
[im 26/230  bone]
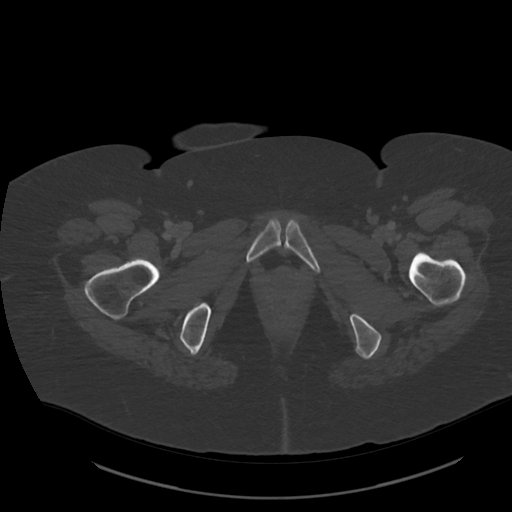
[im 51/230  soft-tissue]
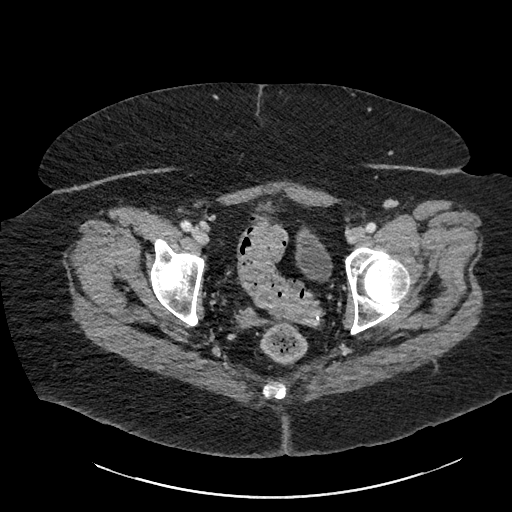
[im 77/230  soft-tissue]
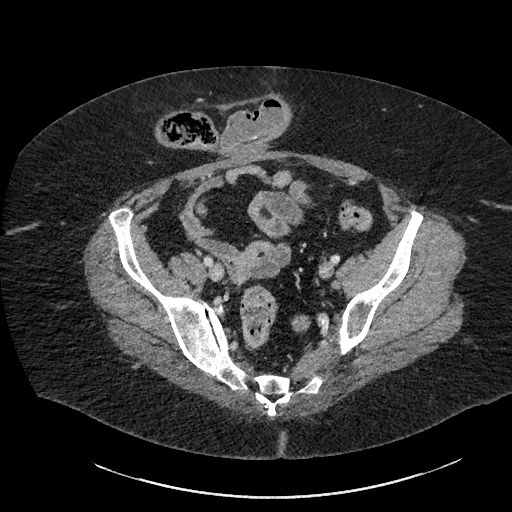
[im 102/230  soft-tissue]
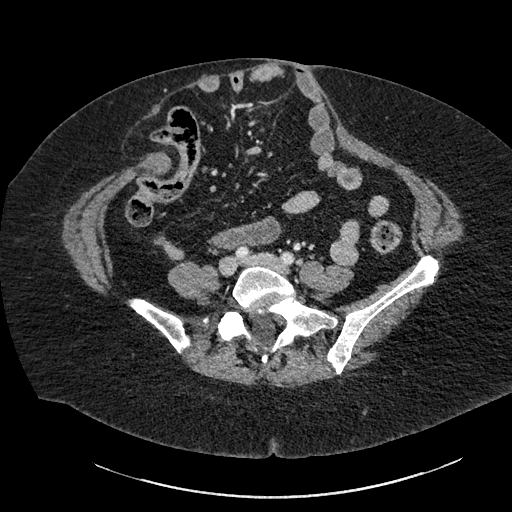
[im 128/230  soft-tissue]
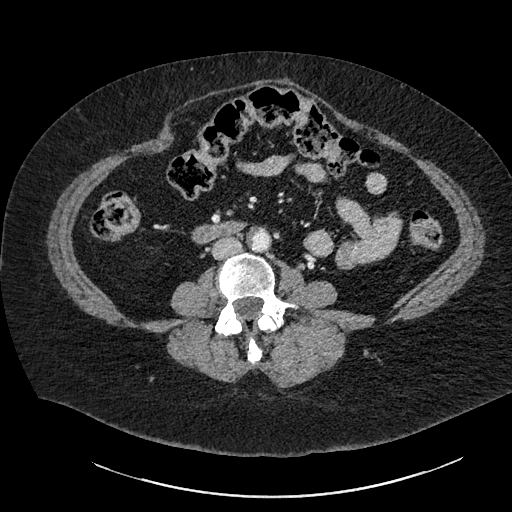
[im 128/230  lung]
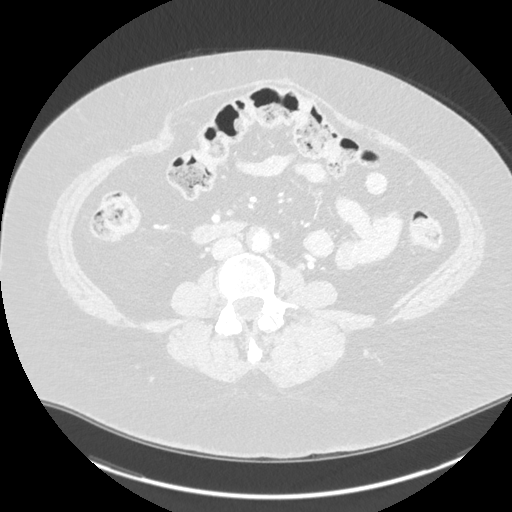
[im 153/230  soft-tissue]
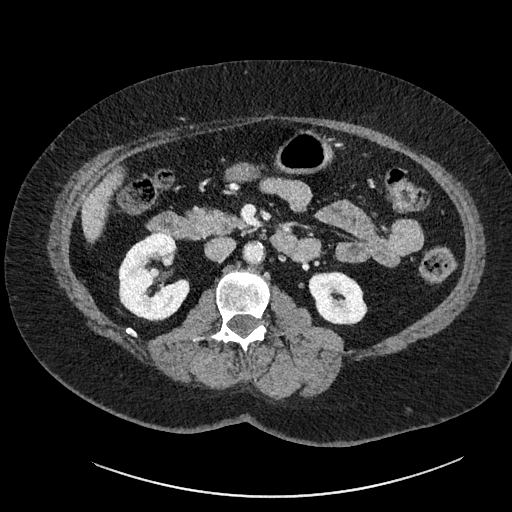
[im 153/230  lung]
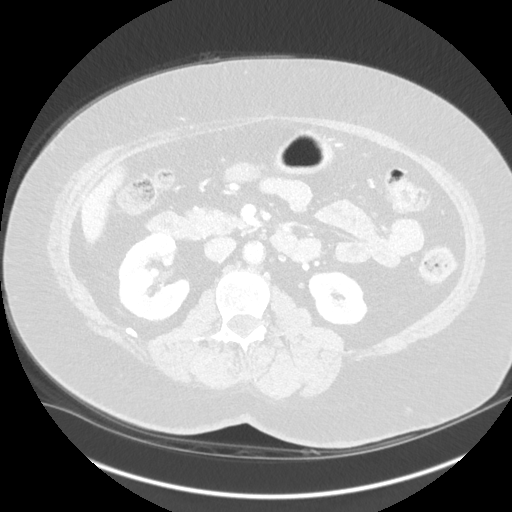
[im 179/230  soft-tissue]
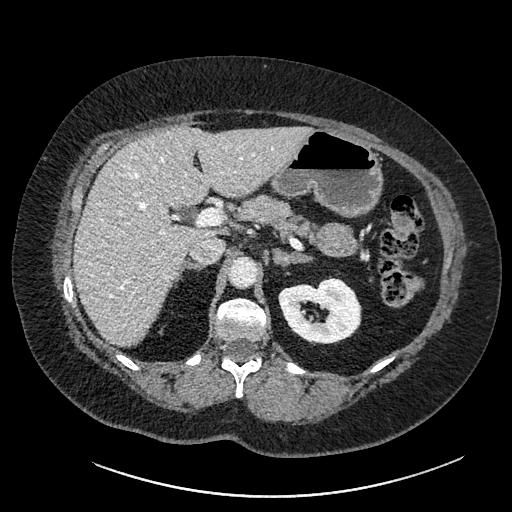
[im 179/230  lung]
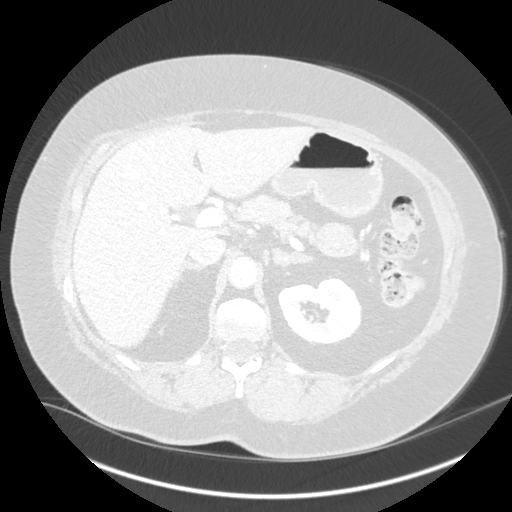
[im 204/230  soft-tissue]
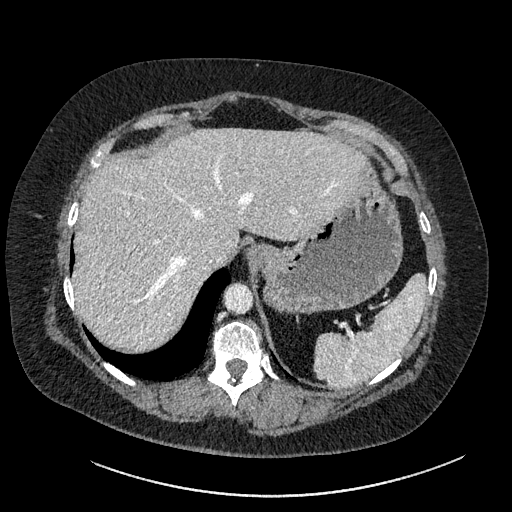
[im 204/230  lung]
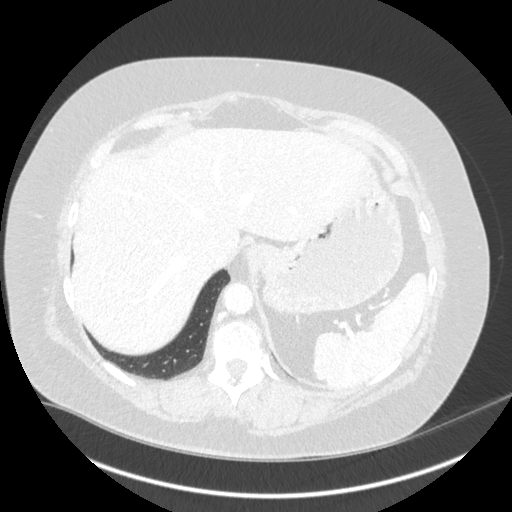

[11 of 46 positions shown; findings below may reference images not displayed]

FINDINGS: VASCULAR

Aorta: Moderate partially calcified atheromatous plaque particularly
in the infrarenal segment. No aneurysm, dissection, or significant
stenosis.

Celiac: Patent without evidence of aneurysm, dissection, vasculitis
or significant stenosis.

SMA: Persistent thrombotic occlusion of the SMA origin stent. The
native SMA is reconstituted just beyond the stent via visceral
collateral vessels. Distally, there has been interval thrombotic
occlusion of a right ileocolic branch since prior study.

Renals: Duplicated on the right, inferior dominant, widely patent.
Duplicated on the left, superior dominant, both patent.

IMA: Short-segment origin stenosis probably related aortic wall
plaque, patent distally.

Inflow: Partially calcified plaque in bilateral common iliac
arteries. No aneurysm, dissection, or stenosis.

Proximal Outflow: Bilateral common femoral and visualized portions
of the superficial and profunda femoral arteries are patent without
evidence of aneurysm, dissection, vasculitis or significant
stenosis.

Veins: Patent hepatic veins, portal vein, superior mesenteric vein,
IMV, splenic vein, bilateral renal veins. Incomplete opacification
of the iliac venous system probably related to scan timing. No
definite venous pathology.

Review of the MIP images confirms the above findings.

NON-VASCULAR

Lower chest: No acute abnormality.

Hepatobiliary: No focal liver abnormality is seen. Status post
cholecystectomy. No biliary dilatation.

Pancreas: Unremarkable. No pancreatic ductal dilatation or
surrounding inflammatory changes.

Spleen: Normal in size without focal abnormality.

Adrenals/Urinary Tract: Nodular left adrenal hyperplasia stable.
Right adrenal normal. No renal mass. No hydronephrosis. Urinary
bladder incompletely distended.

Stomach/Bowel: Stomach is incompletely distended. Small bowel is
nondilated. Anastomotic staple lines in the distal small bowel.
Multiple loops of small bowel and cecum protrude into a broad
ventral hernia without evidence of obstruction or strangulation. The
remainder of the colon is nondilated, unremarkable. No definite
bowel wall thickening nor adjacent inflammatory/edematous change.

Lymphatic: No abdominal or pelvic adenopathy.

Reproductive: Status post hysterectomy. No adnexal masses.

Other: No ascites.  No free air.

Musculoskeletal: Broad ventral hernia involving distal small bowel
and proximal colon without obstruction or strangulation. Regional
bones unremarkable. No fracture or worrisome bone lesion.
IMPRESSION: VASCULAR

1. Persistent occlusion of the SMA origin stent.
2. Interval distal thrombotic/embolic occlusion involving a right
ileocolic arterial branch of the SMA.
3. Patent celiac axis and IMA provide collateral circulation to the
distal SMA distribution.
4. Aortoiliac atherosclerosis ([id number]-170.0) without aneurysm or
stenosis.

NON-VASCULAR

1. Broad ventral hernia involving distal small bowel and proximal
colon, without evidence of obstruction or strangulation.
2. No focal bowel wall thickening or other CT suggestion of
mesenteric ischemia.

## 2019-12-05 ENCOUNTER — Other Ambulatory Visit: Payer: Medicaid Other

## 2019-12-05 ENCOUNTER — Ambulatory Visit (INDEPENDENT_AMBULATORY_CARE_PROVIDER_SITE_OTHER): Payer: Medicare Other | Admitting: Nurse Practitioner

## 2019-12-05 ENCOUNTER — Encounter (INDEPENDENT_AMBULATORY_CARE_PROVIDER_SITE_OTHER): Payer: Medicare Other

## 2019-12-08 ENCOUNTER — Other Ambulatory Visit: Payer: Self-pay

## 2019-12-08 ENCOUNTER — Encounter
Admission: RE | Admit: 2019-12-08 | Discharge: 2019-12-08 | Disposition: A | Discharge status: Home / Self Care | Payer: Medicare Other | Source: Ambulatory Visit | Attending: Podiatry | Admitting: Podiatry

## 2019-12-08 HISTORY — DX: Chronic obstructive pulmonary disease, unspecified: J44.9

## 2019-12-08 HISTORY — DX: Prediabetes: R73.03

## 2019-12-08 NOTE — Patient Instructions (Signed)
Your procedure is scheduled on: Tuesday December 09, 2019 Report to Day Surgery on the 2nd floor of the [facility]. To find out your arrival time, please call [phone] between 1PM - 3PM on: Monday December 08, 2019  REMEMBER: Instructions that are not followed completely may result in serious medical risk, up to and including death; or upon the discretion of your surgeon and anesthesiologist your surgery may need to be rescheduled.  Do not eat food after midnight the night before surgery.  No gum chewing, lozengers or hard candies.  You may however, drink CLEAR liquids up to 2 hours before you are scheduled to arrive for your surgery. Do not drink anything within 2 hours of the start of your surgery.  Clear liquids include: - water   Do NOT drink anything that is not on this list.  Type 1 and Type 2 diabetics should only drink water.  G2 GATORADE DRINK:  Complete drinking 2 hours prior to [place]  No Alcohol for 24 hours before or after surgery.  No Smoking including e-cigarettes for 24 hours prior to surgery.  No chewable tobacco products for at least 6 hours prior to surgery.  No nicotine patches on the day of surgery.  On the morning of surgery brush your teeth with toothpaste and water, you may rinse your mouth with mouthwash if you wish. Do not swallow any toothpaste or mouthwash.  Notify your doctor if there is any change in your medical condition (cold, fever, infection).  Do not wear jewelry, make-up, hairpins, clips or nail polish.  Do not wear lotions, powders, or perfumes.   Do not shave 48 hours prior to surgery.   Contacts and dentures may not be worn into surgery.  Do not bring valuables to the hospital, including drivers license, insurance or credit cards.  [facility] is not responsible for any belongings or valuables.   TAKE THESE MEDICATIONS THE MORNING OF SURGERY: OMEPRAZOLE (take one the night before and one on the  morning of surgery - helps to prevent nausea after surgery.) AMLODIPINE CYMBALTA METOPROLOL WELLBUTRIN   Use CHG Soap  as directed on instruction sheet.  Use inhalers on the day of surgery    Follow recommendations from Cardiologist, Pulmonologist or PCP regarding stopping Aspirin AND Eliquis  Stop Anti-inflammatories (NSAIDS) such as Advil, Aleve, Ibuprofen, Motrin, Naproxen, Naprosyn and Aspirin based products such as Excedrin, Goodys Powder, BC Powder. (May take Tylenol or Acetaminophen if needed.)  Stop ANY OVER THE COUNTER supplements until after surgery. (May continue Vitamin D, Vitamin B, and multivitamin.)  Wear comfortable clothing (specific to your surgery type) to the hospital.  Plan for stool softeners for home use.  If you are being discharged the day of surgery, you will not be allowed to drive home. You will need a responsible adult to drive you home and stay with you that night.   If you are taking public transportation, you will need to have a responsible adult with you. Please confirm with your physician that it is acceptable to use public transportation.   Please call [phone] if you have any questions about these instructions.

## 2019-12-09 ENCOUNTER — Encounter: Payer: Self-pay | Admitting: Podiatry

## 2019-12-09 ENCOUNTER — Ambulatory Visit
Admission: RE | Admit: 2019-12-09 | Discharge: 2019-12-09 | Disposition: A | Discharge status: Home / Self Care | Payer: Medicare Other | Attending: Podiatry | Admitting: Podiatry

## 2019-12-09 ENCOUNTER — Encounter
Admission: RE | Disposition: A | Discharge status: Home / Self Care | Payer: Self-pay | Source: Home / Self Care | Attending: Podiatry

## 2019-12-09 ENCOUNTER — Ambulatory Visit: Payer: Medicare Other | Admitting: Certified Registered Nurse Anesthetist

## 2019-12-09 DIAGNOSIS — I96 Gangrene, not elsewhere classified: Secondary | ICD-10-CM | POA: Insufficient documentation

## 2019-12-09 DIAGNOSIS — Z881 Allergy status to other antibiotic agents status: Secondary | ICD-10-CM | POA: Insufficient documentation

## 2019-12-09 DIAGNOSIS — Z888 Allergy status to other drugs, medicaments and biological substances status: Secondary | ICD-10-CM | POA: Diagnosis not present

## 2019-12-09 DIAGNOSIS — M86172 Other acute osteomyelitis, left ankle and foot: Secondary | ICD-10-CM | POA: Insufficient documentation

## 2019-12-09 DIAGNOSIS — F17210 Nicotine dependence, cigarettes, uncomplicated: Secondary | ICD-10-CM | POA: Insufficient documentation

## 2019-12-09 DIAGNOSIS — G629 Polyneuropathy, unspecified: Secondary | ICD-10-CM | POA: Insufficient documentation

## 2019-12-09 DIAGNOSIS — Z79899 Other long term (current) drug therapy: Secondary | ICD-10-CM | POA: Diagnosis not present

## 2019-12-09 DIAGNOSIS — Z7982 Long term (current) use of aspirin: Secondary | ICD-10-CM | POA: Insufficient documentation

## 2019-12-09 DIAGNOSIS — Z6841 Body Mass Index (BMI) 40.0 and over, adult: Secondary | ICD-10-CM | POA: Diagnosis not present

## 2019-12-09 DIAGNOSIS — I998 Other disorder of circulatory system: Secondary | ICD-10-CM

## 2019-12-09 DIAGNOSIS — J449 Chronic obstructive pulmonary disease, unspecified: Secondary | ICD-10-CM | POA: Insufficient documentation

## 2019-12-09 DIAGNOSIS — Z9862 Peripheral vascular angioplasty status: Secondary | ICD-10-CM | POA: Diagnosis not present

## 2019-12-09 DIAGNOSIS — I739 Peripheral vascular disease, unspecified: Secondary | ICD-10-CM | POA: Insufficient documentation

## 2019-12-09 DIAGNOSIS — I509 Heart failure, unspecified: Secondary | ICD-10-CM | POA: Insufficient documentation

## 2019-12-09 DIAGNOSIS — Z7901 Long term (current) use of anticoagulants: Secondary | ICD-10-CM | POA: Diagnosis not present

## 2019-12-09 DIAGNOSIS — Z8249 Family history of ischemic heart disease and other diseases of the circulatory system: Secondary | ICD-10-CM | POA: Diagnosis not present

## 2019-12-09 DIAGNOSIS — I110 Hypertensive heart disease with heart failure: Secondary | ICD-10-CM | POA: Insufficient documentation

## 2019-12-09 DIAGNOSIS — Z8616 Personal history of COVID-19: Secondary | ICD-10-CM | POA: Insufficient documentation

## 2019-12-09 DIAGNOSIS — K219 Gastro-esophageal reflux disease without esophagitis: Secondary | ICD-10-CM | POA: Insufficient documentation

## 2019-12-09 HISTORY — PX: AMPUTATION TOE: SHX6595

## 2019-12-09 LAB — GLUCOSE, CAPILLARY
Glucose-Capillary: 110 mg/dL — ABNORMAL HIGH (ref 70–99)
Glucose-Capillary: 89 mg/dL (ref 70–99)

## 2019-12-09 SURGERY — AMPUTATION, TOE
Anesthesia: General | Site: Toe | Laterality: Left

## 2019-12-09 MED ORDER — OXYCODONE HCL 5 MG PO TABS: 5.0000 mg | ORAL_TABLET | Freq: Once | ORAL | Status: DC | PRN

## 2019-12-09 MED ORDER — OXYCODONE HCL 5 MG/5ML PO SOLN: 5.0000 mg | Freq: Once | ORAL | Status: DC | PRN

## 2019-12-09 MED ORDER — BUPIVACAINE HCL (PF) 0.5 % IJ SOLN
INTRAMUSCULAR | Status: AC
  Filled 2019-12-09: qty 30

## 2019-12-09 MED ORDER — OXYCODONE-ACETAMINOPHEN 7.5-325 MG PO TABS: 1.0000 | ORAL_TABLET | Freq: Four times a day (QID) | ORAL | 0 refills | Status: AC | PRN

## 2019-12-09 MED ORDER — POVIDONE-IODINE 7.5 % EX SOLN
Freq: Once | CUTANEOUS | Status: DC
  Filled 2019-12-09: qty 118

## 2019-12-09 MED ORDER — CEFAZOLIN SODIUM-DEXTROSE 2-4 GM/100ML-% IV SOLN
2.0000 g | INTRAVENOUS | Status: AC
  Administered 2019-12-09: 11:00:00 2 g via INTRAVENOUS

## 2019-12-09 MED ORDER — FENTANYL CITRATE (PF) 100 MCG/2ML IJ SOLN
INTRAMUSCULAR | Status: AC
  Filled 2019-12-09: qty 2

## 2019-12-09 MED ORDER — ONDANSETRON HCL 4 MG/2ML IJ SOLN
INTRAMUSCULAR | Status: AC
  Filled 2019-12-09: qty 2

## 2019-12-09 MED ORDER — ONDANSETRON HCL 4 MG PO TABS: 4.0000 mg | ORAL_TABLET | Freq: Three times a day (TID) | ORAL | 0 refills | Status: AC | PRN

## 2019-12-09 MED ORDER — MIDAZOLAM HCL 2 MG/2ML IJ SOLN
INTRAMUSCULAR | Status: DC | PRN
  Administered 2019-12-09: 2 mg via INTRAVENOUS

## 2019-12-09 MED ORDER — VANCOMYCIN HCL 1000 MG IV SOLR
INTRAVENOUS | Status: AC
  Filled 2019-12-09: qty 1000

## 2019-12-09 MED ORDER — LACTATED RINGERS IV SOLN: INTRAVENOUS | Status: DC

## 2019-12-09 MED ORDER — MIDAZOLAM HCL 2 MG/2ML IJ SOLN
INTRAMUSCULAR | Status: AC
  Filled 2019-12-09: qty 2

## 2019-12-09 MED ORDER — ONDANSETRON HCL 4 MG/2ML IJ SOLN
INTRAMUSCULAR | Status: DC | PRN
  Administered 2019-12-09: 4 mg via INTRAVENOUS

## 2019-12-09 MED ORDER — CEFAZOLIN SODIUM-DEXTROSE 2-4 GM/100ML-% IV SOLN
INTRAVENOUS | Status: AC
  Filled 2019-12-09: qty 100

## 2019-12-09 MED ORDER — PROPOFOL 10 MG/ML IV BOLUS
INTRAVENOUS | Status: AC
  Filled 2019-12-09: qty 20

## 2019-12-09 MED ORDER — LIDOCAINE HCL (PF) 2 % IJ SOLN
INTRAMUSCULAR | Status: AC
  Filled 2019-12-09: qty 10

## 2019-12-09 MED ORDER — DEXMEDETOMIDINE HCL 200 MCG/2ML IV SOLN
INTRAVENOUS | Status: DC | PRN
  Administered 2019-12-09: 8 ug via INTRAVENOUS

## 2019-12-09 MED ORDER — CEPHALEXIN 500 MG PO CAPS: 500.0000 mg | ORAL_CAPSULE | Freq: Three times a day (TID) | ORAL | 0 refills | Status: AC

## 2019-12-09 MED ORDER — BUPIVACAINE HCL 0.5 % IJ SOLN
INTRAMUSCULAR | Status: DC | PRN
  Administered 2019-12-09 (×2): 10 mL

## 2019-12-09 MED ORDER — PROPOFOL 500 MG/50ML IV EMUL
INTRAVENOUS | Status: DC | PRN
  Administered 2019-12-09: 50 ug/kg/min via INTRAVENOUS

## 2019-12-09 MED ORDER — FENTANYL CITRATE (PF) 100 MCG/2ML IJ SOLN
INTRAMUSCULAR | Status: DC | PRN
  Administered 2019-12-09 (×2): 25 ug via INTRAVENOUS

## 2019-12-09 MED ORDER — FENTANYL CITRATE (PF) 100 MCG/2ML IJ SOLN: 25.0000 ug | INTRAMUSCULAR | Status: DC | PRN

## 2019-12-09 MED ORDER — LIDOCAINE HCL (CARDIAC) PF 100 MG/5ML IV SOSY
PREFILLED_SYRINGE | INTRAVENOUS | Status: DC | PRN
  Administered 2019-12-09: 100 mg via INTRAVENOUS

## 2019-12-09 SURGICAL SUPPLY — 45 items
BLADE MED AGGRESSIVE (BLADE) IMPLANT
BLADE OSC/SAGITTAL MD 5.5X18 (BLADE) IMPLANT
BLADE SURG 15 STRL LF DISP TIS (BLADE) IMPLANT
BLADE SURG 15 STRL SS (BLADE)
BLADE SURG MINI STRL (BLADE) IMPLANT
BNDG CONFORM 2 STRL LF (GAUZE/BANDAGES/DRESSINGS) IMPLANT
BNDG ELASTIC 4X5.8 VLCR STR LF (GAUZE/BANDAGES/DRESSINGS) ×2 IMPLANT
BNDG ESMARK 4X12 TAN STRL LF (GAUZE/BANDAGES/DRESSINGS) ×2 IMPLANT
BNDG GAUZE 4.5X4.1 6PLY STRL (MISCELLANEOUS) ×2 IMPLANT
CANISTER SUCT 1200ML W/VALVE (MISCELLANEOUS) ×2 IMPLANT
CNTNR SPEC 2.5X3XGRAD LEK (MISCELLANEOUS)
CONT SPEC 4OZ STER OR WHT (MISCELLANEOUS)
CONTAINER SPEC 2.5X3XGRAD LEK (MISCELLANEOUS) IMPLANT
COVER WAND RF STERILE (DRAPES) ×2 IMPLANT
CUFF TOURN 18 STER (MISCELLANEOUS) IMPLANT
CUFF TOURN DUAL PL 12 NO SLV (MISCELLANEOUS) IMPLANT
DRAPE FLUOR MINI C-ARM 54X84 (DRAPES) IMPLANT
DURAPREP 26ML APPLICATOR (WOUND CARE) ×2 IMPLANT
ELECT REM PT RETURN 9FT ADLT (ELECTROSURGICAL) ×2
ELECTRODE REM PT RTRN 9FT ADLT (ELECTROSURGICAL) ×1 IMPLANT
GAUZE SPONGE 4X4 12PLY STRL (GAUZE/BANDAGES/DRESSINGS) ×4 IMPLANT
GAUZE XEROFORM 1X8 LF (GAUZE/BANDAGES/DRESSINGS) ×2 IMPLANT
GLOVE BIO SURGEON STRL SZ7 (GLOVE) ×2 IMPLANT
GLOVE INDICATOR 7.0 STRL GRN (GLOVE) ×2 IMPLANT
GOWN STRL REUS W/ TWL LRG LVL3 (GOWN DISPOSABLE) ×2 IMPLANT
GOWN STRL REUS W/TWL LRG LVL3 (GOWN DISPOSABLE) ×2
HANDPIECE VERSAJET DEBRIDEMENT (MISCELLANEOUS) IMPLANT
KIT TURNOVER KIT A (KITS) ×2 IMPLANT
LABEL OR SOLS (LABEL) ×2 IMPLANT
NEEDLE FILTER BLUNT 18X 1/2SAF (NEEDLE) ×1
NEEDLE FILTER BLUNT 18X1 1/2 (NEEDLE) ×1 IMPLANT
NEEDLE HYPO 25X1 1.5 SAFETY (NEEDLE) ×4 IMPLANT
NS IRRIG 500ML POUR BTL (IV SOLUTION) ×2 IMPLANT
PACK EXTREMITY ARMC (MISCELLANEOUS) ×2 IMPLANT
SOL .9 NS 3000ML IRR  AL (IV SOLUTION)
SOL .9 NS 3000ML IRR UROMATIC (IV SOLUTION) IMPLANT
SOL PREP PVP 2OZ (MISCELLANEOUS) ×2
SOLUTION PREP PVP 2OZ (MISCELLANEOUS) ×1 IMPLANT
STOCKINETTE STRL 6IN 960660 (GAUZE/BANDAGES/DRESSINGS) ×2 IMPLANT
SUT ETHILON 3-0 FS-10 30 BLK (SUTURE) ×4
SUT VIC AB 3-0 SH 27 (SUTURE) ×1
SUT VIC AB 3-0 SH 27X BRD (SUTURE) ×1 IMPLANT
SUTURE EHLN 3-0 FS-10 30 BLK (SUTURE) ×2 IMPLANT
SWAB CULTURE AMIES ANAERIB BLU (MISCELLANEOUS) IMPLANT
SYR 10ML LL (SYRINGE) ×2 IMPLANT

## 2019-12-09 NOTE — Anesthesia Preprocedure Evaluation (Signed)
Anesthesia Evaluation  Patient identified by MRN, date of birth, ID band Patient awake    Reviewed: Allergy & Precautions, H&P , NPO status , reviewed documented beta blocker date and time   History of Anesthesia Complications Negative for: history of anesthetic complications  Airway Mallampati: III  TM Distance: <3 FB Neck ROM: limited    Dental  (+) Edentulous Upper, Edentulous Lower, Poor Dentition   Pulmonary COPD, Current Smoker and Patient abstained from smoking.,   Crackles Bilat         Cardiovascular hypertension, (-) angina+ Peripheral Vascular Disease and +CHF  Normal cardiovascular exam  ECHO Study Conclusions  - Left ventricle: Wall thickness was increased in a pattern of mild   LVH. Systolic function was normal. The estimated ejection   fraction was in the range of 60% to 65%. Doppler parameters are   consistent with abnormal left ventricular relaxation (grade 1   diastolic dysfunction). - Aortic valve: Valve area (Vmax): 2.13 cm^2. No aortic stenosis   Neuro/Psych PSYCHIATRIC DISORDERS Depression  Neuromuscular disease    GI/Hepatic GERD  Medicated and Controlled,  Endo/Other  Morbid obesity  Renal/GU Renal disease     Musculoskeletal  (+) Arthritis ,   Abdominal   Peds  Hematology   Anesthesia Other Findings Past Medical History: No date: Abdominal aortic atherosclerosis ([place])     Comment:  a. 05/2017 CTA abd/pelvis: significant atherosclerotic               dzs of the infrarenal abd Ao w/ some mural thrombus. No               aneurysm or dissection. No date: Abdominal pain No date: Acute focal ischemia of small intestine ([place]) 06/13/2017: Acute right-sided low back pain with right-sided sciatica 07/29/2017: AKI (acute kidney injury) ([place]) No date: Baker's cyst of knee, right     Comment:  a. 07/2016 U/S: 4.1 x 1.4 x 2.9 cystic structure in R               poplitetal fossa. No date: Bell's  palsy No date: Borderline diabetes No date: Cancer [facility])     Comment:  skin cancer on nose No date: Chest pain     Comment:  a. 08/2012 Lexiscan MV: EF 54%, non ischemia/infarct. No date: CHF (congestive heart failure) (HCC) No date: Chronic idiopathic constipation No date: Chronic mesenteric ischemia (HCC) No date: Diastolic dysfunction     Comment:  a. 05/2017 Echo: Ef 60-65%, no rwma, Gr1 DD, no source of              cardiac emboli. 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 05/2017 CTA Abd/pelvis: apparent thrombus or embolus               in prox SMA (70-90%); b. 05/2017 catheter directed tPA,               mechanical thrombectomy, and stenting of the SMA. 01/19/2016: Essential hypertension with goal blood pressure less than  140/90 01/19/2016: Gastroesophageal reflux disease No date: GERD (gastroesophageal reflux disease) No date: History of kidney stones No date: Hyperlipidemia No date: Hypertension 07/29/2017: Hypotension No date: Morbid obesity with BMI of 40.0-44.9, adult ([place]) 06/19/2017: Occlusive mesenteric ischemia ([place]) 06/01/2016: Personal history of other malignant neoplasm of skin 06/13/2017: Primary osteoarthritis of both knees 01/19/2016: Recurrent major depressive disorder ([place]) 08/06/2017: SBO (small bowel obstruction) (HCC) No date: 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[place]) 01/09/2014: Urinary  tract infection  Past Surgical History: No date: APPENDECTOMY No date: CHOLECYSTECTOMY 08/08/2017: LAPAROTOMY; N/A     Comment:  Procedure: EXPLORATORY LAPAROTOMY POSSIBLE BOWEL               RESECTION;  Surgeon: Jules Husbands, MD;  Location: [facility]               ORS;  Service: General;  Laterality: N/A; 06/22/2017: TEE WITHOUT CARDIOVERSION; N/A     Comment:  Procedure: TRANSESOPHAGEAL ECHOCARDIOGRAM (TEE);                Surgeon: Wellington Hampshire, MD;  Location: [facility] ORS;                Service: Cardiovascular;  Laterality: N/A; No date:  VAGINAL HYSTERECTOMY 06/20/2017: VISCERAL ARTERY INTERVENTION; N/A     Comment:  Procedure: Visceral Artery Intervention, possible aortic              thrombectomy;  Surgeon: Algernon Huxley, MD;  Location: [place] CV LAB;  Service: Cardiovascular;  Laterality:               N/A; 01/28/2018: VISCERAL ARTERY INTERVENTION; N/A     Comment:  Procedure: VISCERAL ARTERY INTERVENTION;  Surgeon: Algernon Huxley, MD;  Location: [place] CV LAB;  Service:               Cardiovascular;  Laterality: N/A;  BMI    Body Mass Index:  41.34 kg/m      Reproductive/Obstetrics                             Anesthesia Physical  Anesthesia Plan  ASA: IV  Anesthesia Plan: General   Post-op Pain Management:    Induction: Intravenous  PONV Risk Score and Plan: 1 and Treatment may vary due to age or medical condition, TIVA, Ondansetron and Midazolam  Airway Management Planned: Natural Airway and Nasal Cannula  Additional Equipment:   Intra-op Plan:   Post-operative Plan:   Informed Consent: I have reviewed the patients History and Physical, chart, labs and discussed the procedure including the risks, benefits and alternatives for the proposed anesthesia with the patient or authorized representative who has indicated his/her understanding and acceptance.     Dental Advisory Given  Plan Discussed with: CRNA  Anesthesia Plan Comments: (Patient consented for risks of anesthesia including but not limited to:  - adverse reactions to medications - risk of intubation if required - damage to teeth, lips or other oral mucosa - sore throat or hoarseness - Damage to heart, brain, lungs or loss of life  Patient voiced understanding.)        Anesthesia Quick Evaluation

## 2019-12-09 NOTE — Progress Notes (Signed)
Post op shoe applied 

## 2019-12-09 NOTE — Discharge Instructions (Signed)
AMBULATORY SURGERY  DISCHARGE INSTRUCTIONS   1) The drugs that you were given will stay in your system until tomorrow so for the next 24 hours you should not:  A) Drive an automobile B) Make any legal decisions C) Drink any alcoholic beverage   2) You may resume regular meals tomorrow.  Today it is better to start with liquids and gradually work up to solid foods.  You may eat anything you prefer, but it is better to start with liquids, then soup and crackers, and gradually work up to solid foods.   3) Please notify your doctor immediately if you have any unusual bleeding, trouble breathing, redness and pain at the surgery site, drainage, fever, or pain not relieved by medication.    4) Additional Instructions:        Please contact your physician with any problems or Same Day Surgery at [phone], Monday through Friday 6 am to 4 pm, or [place] at [facility] number at [phone].[place] DR. TROXLER, DR. Vickki Muff, AND DR. [place]   1. Take your medication as prescribed.  Pain medication should be taken only as needed.  Do not start your Eliquis until 24 hours after procedure.  2. Keep the dressing clean, dry and intact.  Try to walk minimal until your incision site has healed.  If ambulating, walk with heel contact only on your left side in a surgical shoe at all times.  3. Keep your foot elevated above the heart level for the first 48 hours.  4. Walking to the bathroom and brief periods of walking are acceptable, unless we have instructed you to be non-weight bearing.  5. Always wear your post-op shoe when walking.  Always use your crutches if you are to be non-weight bearing.  6. Do not take a shower. Baths are permissible as long as the foot is kept out of the water.   7. Every hour you are awake:  - Bend your knee 15 times. - Flex foot 15  times - Massage calf 15 times  8. Call [facility] [phone]) if any of the following problems occur: - You develop a temperature or fever. - The bandage becomes saturated with blood. - Medication does not stop your pain. - Injury of the foot occurs. - Any symptoms of infection including redness, odor, or red streaks running from wound.  AMBULATORY SURGERY  DISCHARGE INSTRUCTIONS   5) The drugs that you were given will stay in your system until tomorrow so for the next 24 hours you should not:  D) Drive an automobile E) Make any legal decisions F) Drink any alcoholic beverage   6) You may resume regular meals tomorrow.  Today it is better to start with liquids and gradually work up to solid foods.  You may eat anything you prefer, but it is better to start with liquids, then soup and crackers, and gradually work up to solid foods.   7) Please notify your doctor immediately if you have any unusual bleeding, trouble breathing, redness and pain at the surgery site, drainage, fever, or pain not relieved by medication.    8) Additional Instructions:        Please contact your physician with any problems or Same Day Surgery at [phone], Monday through Friday 6 am to 4 pm, or [place] at [facility] number at [phone].

## 2019-12-09 NOTE — H&P (Signed)
Podiatry history and physical  Chief Complaint: Left third toe distal gangrene   HPI: Carla [name] is a 59 y.o. female who presents with left third toe distal gangrene with associated pain and discomfort.  Patient was seen in clinic about a month ago and noted to have gangrenous changes to the left forefoot more specifically over the third toe.  Patient was subsequently sent to vascular via stat consultation in which left lower extremity revascularization was performed.  Patient subsequently had Covid during that time as well.  Patient subsequently followed up about a month or so after the revascularization for further examination which revealed gangrenous changes to the left third toe further.  Patient presents today for amputation of left third toe after benefits and complications were discussed.  Patient denies nausea, vomiting, fever, chills.  Patient complains of pain in the left third toe.  Patient has continued follow-up with vascular surgery.  PMHx:  Past Medical History:  Diagnosis Date  . Abdominal aortic atherosclerosis ([place])    a. 05/2017 CTA abd/pelvis: significant atherosclerotic dzs of the infrarenal abd Ao w/ some mural thrombus. No aneurysm or dissection.  . Abdominal pain   . Acute focal ischemia of small intestine (HCC)   . Acute right-sided low back pain with right-sided sciatica 06/13/2017  . AKI (acute kidney injury) ([place]) 07/29/2017  . Tezra Mahr's cyst of knee, right    a. 07/2016 U/S: 4.1 x 1.4 x 2.9 cystic structure in R poplitetal fossa.  . Bell's palsy   . Borderline diabetes   . Cancer ([place])    skin cancer on nose  . Chest pain    a. 08/2012 Lexiscan MV: EF 54%, non ischemia/infarct.  . CHF (congestive heart failure) ([place])   . Chronic idiopathic constipation   . Chronic mesenteric ischemia ([place])   . COPD (chronic obstructive pulmonary disease) ([place])   . Diastolic dysfunction    a. 05/2017 Echo: Ef 60-65%, no rwma, Gr1 DD, no source of cardiac emboli.  . Embolus of  superior mesenteric artery ([place])    a. 05/2017 CTA Abd/pelvis: apparent thrombus or embolus in prox SMA (70-90%); b. 05/2017 catheter directed tPA, mechanical thrombectomy, and stenting of the SMA.  . Essential hypertension with goal blood pressure less than 140/90 01/19/2016  . Gastroesophageal reflux disease 01/19/2016  . GERD (gastroesophageal reflux disease)   . History of kidney stones   . Hyperlipidemia   . Hypertension   . Hypotension 07/29/2017  . Morbid obesity with BMI of 40.0-44.9, adult ([place])   . Occlusive mesenteric ischemia ([place]) 06/19/2017  . Personal history of other malignant neoplasm of skin 06/01/2016  . Pre-diabetes   . Primary osteoarthritis of both knees 06/13/2017  . Recurrent major depressive disorder ([place]) 01/19/2016  . SBO (small bowel obstruction) ([place]) 08/06/2017  . 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 Urinary tract infection 01/09/2014    Surgical Hx:  Past Surgical History:  Procedure Laterality Date  . APPENDECTOMY    . CHOLECYSTECTOMY    . LAPAROTOMY N/A 08/08/2017   Procedure: EXPLORATORY LAPAROTOMY POSSIBLE BOWEL RESECTION;  Surgeon: Jules Husbands, MD;  Location: [facility] ORS;  Service: General;  Laterality: N/A;  . LOWER EXTREMITY ANGIOGRAPHY Left 11/17/2019   Procedure: LOWER EXTREMITY ANGIOGRAPHY;  Surgeon: Algernon Huxley, MD;  Location: [place] CV LAB;  Service: Cardiovascular;  Laterality: Left;  . LOWER EXTREMITY INTERVENTION N/A 11/19/2019   Procedure: LOWER EXTREMITY INTERVENTION;  Surgeon: Algernon Huxley, MD;  Location: [place] CV LAB;  Service: Cardiovascular;  Laterality: N/A;  . TEE WITHOUT CARDIOVERSION N/A 06/22/2017   Procedure: TRANSESOPHAGEAL ECHOCARDIOGRAM (TEE);  Surgeon: Wellington Hampshire, MD;  Location: [facility] ORS;  Service: Cardiovascular;  Laterality: N/A;  . TOTAL KNEE ARTHROPLASTY Right 07/11/2018   Procedure: TOTAL KNEE ARTHROPLASTY;  Surgeon: Corky Mull, MD;  Location: [facility] ORS;  Service: Orthopedics;  Laterality:  Right;  [name] VAGINAL HYSTERECTOMY    . VISCERAL ARTERY INTERVENTION N/A 06/20/2017   Procedure: Visceral Artery Intervention, possible aortic thrombectomy;  Surgeon: Algernon Huxley, MD;  Location: [place] CV LAB;  Service: Cardiovascular;  Laterality: N/A;  . VISCERAL ARTERY INTERVENTION N/A 01/28/2018   Procedure: VISCERAL ARTERY INTERVENTION;  Surgeon: Algernon Huxley, MD;  Location: [place] CV LAB;  Service: Cardiovascular;  Laterality: N/A;    FHx:  Family History  Problem Relation Age of Onset  . Hypertension Mother   . Heart disease Mother   . Heart attack Mother   . Breast cancer Mother   . Hypertension Father   . Breast cancer Paternal Aunt     Social History:  reports that she has been smoking cigarettes. She has been smoking about 0.50 packs per day. She has never used smokeless tobacco. She reports that she does not drink alcohol or use drugs.  Allergies:  Allergies  Allergen Reactions  . Gabapentin     Tremors   . Bactrim [Sulfamethoxazole-Trimethoprim] Itching    Review of Systems: General ROS: negative Respiratory ROS: positive for - cough Cardiovascular ROS: no chest pain or dyspnea on exertion Musculoskeletal ROS: positive for - joint pain and joint swelling Neurological ROS: positive for - numbness/tingling Dermatological ROS: positive for Left third toe gangrene  Medications Prior to Admission  Medication Sig Dispense Refill  . acetaminophen (TYLENOL) 500 MG tablet Take 1,000 mg by mouth every 6 (six) hours as needed for moderate pain or headache.    . albuterol (VENTOLIN HFA) 108 (90 Base) MCG/ACT inhaler Inhale 2 puffs into the lungs every 6 (six) hours as needed for wheezing or shortness of breath.     [name] amLODipine (NORVASC) 5 MG tablet Take 1 tablet (5 mg total) by mouth daily. 30 tablet 2  . apixaban (ELIQUIS) 5 MG TABS tablet Take 5 mg by mouth 2 (two) times daily.    [name] atorvastatin (LIPITOR) 80 MG tablet Take 80 mg by mouth at bedtime.     [name]  buPROPion (WELLBUTRIN XL) 150 MG 24 hr tablet Take 150 mg by mouth daily.     . clotrimazole-betamethasone (LOTRISONE) cream Apply 1 application topically 2 (two) times daily as needed (for dry skin).     . DULoxetine (CYMBALTA) 60 MG capsule Take 60 mg by mouth daily.    . ferrous sulfate 325 (65 FE) MG tablet Take 325 mg by mouth daily.    [name] HYDROcodone-acetaminophen (NORCO/VICODIN) 5-325 MG tablet Take 1 tablet by mouth every 6 (six) hours as needed for moderate pain.    . metoprolol tartrate (LOPRESSOR) 50 MG tablet Take 1 tablet (50 mg total) by mouth 2 (two) times daily. 30 tablet 0  . omeprazole (PRILOSEC) 20 MG capsule Take 20 mg by mouth 2 (two) times daily before a meal.    . topiramate (TOPAMAX) 25 MG tablet Take 25 mg by mouth 2 (two) times daily.     [name] aspirin EC 81 MG EC tablet Take 1 tablet (81 mg total) by mouth daily. (Patient not taking: Reported on 12/04/2019) 90 tablet 3  . gabapentin (  NEURONTIN) 100 MG capsule Take 1 capsule (100 mg total) by mouth 2 (two) times daily. (Patient not taking: Reported on 12/04/2019) 180 capsule 3  . lidocaine (LIDODERM) 5 % Place 1 patch onto the skin daily. Remove & Discard patch within 12 hours or as directed by MD (Patient not taking: Reported on 12/04/2019) 30 patch 0  . methylPREDNISolone (MEDROL DOSEPAK) 4 MG TBPK tablet Take 4-24 mg by mouth See admin instructions. (Typical regimens for 21 tablet dose packs of Methylprednisolone 4mg , Prednisone 5mg , and Prednisone 10mg ) Day 1: 2 tabs before breakfast, 1 tab after lunch, 1 tab after supper, and 2 tabs at bedtime. Day 2: 1 tab before breakfast, 1 tab after lunch, 1 tab after supper, and 2 tabs at bedtime. Day 3: 1 tab before breakfast, 1 tab after lunch, 1 tab after supper, and 1 tab at bedtime. Day 4: 1 tab before breakfast, 1 tab after lunch, and 1 tab at bedtime. Day 5: 1 tab before breakfast and 1 tab at bedtime. Day 6: 1 tab before breakfast.    . Multiple Vitamin (MULTIVITAMIN WITH  MINERALS) TABS tablet Take 1 tablet by mouth daily. 90 tablet 3  . oxyCODONE (OXY IR/ROXICODONE) 5 MG immediate release tablet One to Two Tabs Every Six Hours As Needed For Pain (Patient not taking: Reported on 12/04/2019) 50 tablet 0    Physical Exam: General: Alert and oriented.  No apparent distress. CV: RRR, no murmur noted  Resp: Crackles bil, nonlabored breathing  Vascular: DP/PT pulses +1 bilateral, capillary fill time intact to digits of feet x9 with the exception of the left third toe distally which appears gangrenous.  Mild erythema and edema present to the left third toe.  Neuro: Light touch sensation reduced to digits bilaterally.  Derm: Left third toe distally appears gangrenous with erythematous and edematous changes, depth of wound appears to be to bone.  Pain on palpation left third toe.  No other open ulcerations present.  MSK: Pain on palpation left third toe.  Results for orders placed or performed during the hospital encounter of 12/09/19 (from the past 48 hour(s))  Glucose, capillary     Status: Abnormal   Collection Time: 12/09/19  9:48 AM  Result Value Ref Range   Glucose-Capillary 110 (H) 70 - 99 mg/dL   No results found.  Blood pressure 140/81, pulse 65, temperature 98 F (36.7 C), temperature source Oral, resp. rate 20, SpO2 97 %.  Assessment 1. Gangrene left third toe 2. Peripheral vascular disease 3. Neuropathy  Plan -Patient seen and examined. -Discussed all treatment options with the patient both conservative and surgical attempts at correction including potential risks and complications of surgical intervention.  At this time patient is elected for left third toe amputation.  Postoperative course and instructions given to patient in detail.  Patient understands would like to proceed. -Patient presents today for amputation left third toe.  Patient be discharged home afterwards with the appropriate discharge instructions and weightbearing as tolerated  with heel contact only with try to stay off the foot is much possible.  Prescriptions will be sent postoperatively as well.  Caroline More, DPm 12/09/2019, 10:48 AM

## 2019-12-09 NOTE — Transfer of Care (Signed)
Immediate Anesthesia Transfer of Care Note  Patient: Carla [name]  Procedure(s) Performed: AMPUTATION TOE MPJ left (Left Toe)  Patient Location: PACU  Anesthesia Type:General  Level of Consciousness: awake, alert  and oriented  Airway & Oxygen Therapy: Patient Spontanous Breathing  Post-op Assessment: Report given to RN and Post -op Vital signs reviewed and stable  Post vital signs: Reviewed and stable  Last Vitals:  Vitals Value Taken Time  BP 116/99 12/09/19 1145  Temp    Pulse    Resp 15 12/09/19 1147  SpO2    Vitals shown include unvalidated device data.  Last Pain:  Vitals:   12/09/19 0922  TempSrc: Oral         Complications: No apparent anesthesia complications

## 2019-12-09 NOTE — Op Note (Signed)
PODIATRY / FOOT AND ANKLE SURGERY OPERATIVE REPORT    SURGEON: Caroline More, DPM  PRE-OPERATIVE DIAGNOSIS:  1.  Left third toe gangrene 2.  PVD status post revascularization left lower extremity  POST-OPERATIVE DIAGNOSIS: Same  PROCEDURE(S): 1. Left third toe amputation  HEMOSTASIS: No tourniquet  ANESTHESIA: MAC  ESTIMATED BLOOD LOSS: 10 cc  FINDING(S): 1.  Gangrene left third toe to the level of the proximal phalanx base  PATHOLOGY/SPECIMEN(S): Left third toe pathologic specimen  INDICATIONS:   Carla [name] is a 59 y.o. female who presents with left third toe gangrene.  Patient was seen about a month ago in clinic due to having gangrenous changes to the tip of the left third toe.  She was sent for a stat vascular consultation had revascularization performed to left lower extremity.  Patient presents today for surgical intervention consisting of left third toe amputation.  All treatment options of been discussed with the patient both conservative and surgical attempts at correction including potential risks and complications of surgical intervention.  DESCRIPTION: After obtaining full informed written consent, the patient was brought back to the operating room and placed supine upon the operating table.  The patient received IV antibiotics prior to induction.  After obtaining adequate anesthesia, a block was performed about the left third ray utilizing a total of 10 cc of half percent Marcaine plain.  The patient was prepped and draped in the standard fashion.  Attention was directed to the left third toe at the level of the MPJ where a racquet type of incision was made starting dorsally over the third metatarsal phalangeal joint reacting around the proximal phalanx base and then coming back together at the plantar aspect of the third metatarsal phalangeal joint.  This incision was made straight to bone.  An extensor tenotomy and capsulotomy was performed followed by the release of  the collateral and suspensory ligaments as well as the plantar plate and any connection to the flexor tendon.  The third digit was disarticulated and passed off the operative site and sent off for pathology.  The third metatarsal head appeared to be healthy in nature with no signs of osteomyelitis present.  The tissues appear to be healthy and viable at this point appeared to be bleeding adequately.  The Bovie was used to cauterize any bleeding vessels at this point and hemostasis appeared to be well achieved.  The surgical site was flushed with copious amounts normal sterile saline.  The deep fascia and subcutaneous tissue was reapproximated well coapted with 3-0 Vicryl.  The skin was then reapproximated well coapted with 3-0 nylon in a combination of horizontal mattress and simple type stitching.  An additional 10 cc of half percent Marcaine plain was injected about the operative site after the procedure.  Postoperative dressing was applied consisting of Xeroform followed by 4 x 4 gauze, Kerlix, Coban.  The patient tolerated the procedure and anesthesia well was transferred to recovery room vital signs stable vascular status intact to all remaining toes of both feet.  Following appear to postoperative monitoring the patient be discharged home with the following written oral postop instructions: Keep surgical dressings clean, dry, and intact until postoperative appointment, patient instructed on resting and elevation therapy, instructed patient on usage of pain medication as prescribed, antibiotics, antinausea medicine, and restarting Eliquis 24 hours after the procedure.  Patient to try to stay off of the left foot is much as possible until the incision is healed and instructed to try to walk on  her heel only.  Surgical shoe dispensed.  COMPLICATIONS: None  CONDITION: Good, stable  Caroline More, DPM

## 2019-12-09 NOTE — OR Nursing (Signed)
Spoke with Dr. Luana Shu at this time about pt taking elequis on 12/06/19, per provider no changes to scheduled surgery today at this time.

## 2019-12-10 LAB — SURGICAL PATHOLOGY

## 2019-12-10 NOTE — Anesthesia Postprocedure Evaluation (Signed)
Anesthesia Post Note  Patient: Carla [name]  Procedure(s) Performed: AMPUTATION TOE MPJ left (Left Toe)  Patient location during evaluation: PACU Anesthesia Type: General Level of consciousness: awake and alert Pain management: pain level controlled Vital Signs Assessment: post-procedure vital signs reviewed and stable Respiratory status: spontaneous breathing, nonlabored ventilation, respiratory function stable and patient connected to nasal cannula oxygen Cardiovascular status: blood pressure returned to baseline and stable Postop Assessment: no apparent nausea or vomiting Anesthetic complications: no     Last Vitals:  Vitals:   12/09/19 1241 12/09/19 1300  BP: (!) 125/56 (!) 124/55  Pulse: 61 62  Resp: 17 18  Temp: (!) 36.1 C (!) 36.1 C  SpO2:      Last Pain:  Vitals:   12/10/19 0804  TempSrc:   PainSc: 4                  Precious Haws Shamell Suarez

## 2019-12-17 ENCOUNTER — Ambulatory Visit (INDEPENDENT_AMBULATORY_CARE_PROVIDER_SITE_OTHER): Payer: Medicare Other | Admitting: Surgery

## 2019-12-17 ENCOUNTER — Encounter: Payer: Self-pay | Admitting: Surgery

## 2019-12-17 ENCOUNTER — Other Ambulatory Visit: Payer: Self-pay

## 2019-12-17 VITALS — BP 128/82 | HR 67 | Temp 97.7°F | Resp 14 | Ht 62.0 in | Wt 245.2 lb

## 2019-12-17 DIAGNOSIS — I70222 Atherosclerosis of native arteries of extremities with rest pain, left leg: Secondary | ICD-10-CM | POA: Diagnosis not present

## 2019-12-17 DIAGNOSIS — K439 Ventral hernia without obstruction or gangrene: Secondary | ICD-10-CM

## 2019-12-17 NOTE — Patient Instructions (Addendum)
Dr.Pabon discussed with patient that he recommends patient to set goals of trying to lose weight (at least 35 lbs) and also recommends patient to stop or cut back on smoking. Dr.Pabon discussed with patient that we would contact Dr.Yoo to discuss that the patient is ready to move forward with the Bariatrics for the Sleeve Surgery. Dr.Pabon would like to see the patient back in three months.   Ventral Hernia  A ventral hernia is a bulge of tissue from inside the abdomen that pushes through a weak area of the muscles that form the front wall of the abdomen. The tissues inside the abdomen are inside a sac (peritoneum). These tissues include the small intestine, large intestine, and the fatty tissue that covers the intestines (omentum). Sometimes, the bulge that forms a hernia contains intestines. Other hernias contain only fat. Ventral hernias do not go away without surgical treatment. There are several types of ventral hernias. You may have:  A hernia at an incision site from previous abdominal surgery (incisional hernia).  A hernia just above the belly button (epigastric hernia), or at the belly button (umbilical hernia). These types of hernias can develop from heavy lifting or straining.  A hernia that comes and goes (reducible hernia). It may be visible only when you lift or strain. This type of hernia can be pushed back into the abdomen (reduced).  A hernia that traps abdominal tissue inside the hernia (incarcerated hernia). This type of hernia does not reduce.  A hernia that cuts off blood flow to the tissues inside the hernia (strangulated hernia). The tissues can start to die if this happens. This is a very painful bulge that cannot be reduced. A strangulated hernia is a medical emergency. What are the causes? This condition is caused by abdominal tissue putting pressure on an area of weakness in the abdominal muscles. What increases the risk? The following factors may make you more likely to  develop this condition:  Being female.  Being 17 or older.  Being overweight or obese.  Having had previous abdominal surgery, especially if there was an infection after surgery.  Having had an injury to the abdominal wall.  Having had several pregnancies.  Having a buildup of fluid inside the abdomen (ascites). What are the signs or symptoms? The only symptom of a ventral hernia may be a painless bulge in the abdomen. A reducible hernia may be visible only when you strain, cough, or lift. Other symptoms may include:  Dull pain.  A feeling of pressure. Signs and symptoms of a strangulated hernia may include:  Increasing pain.  Nausea and vomiting.  Pain when pressing on the hernia.  The skin over the hernia turning red or purple.  Constipation.  Blood in the stool (feces). How is this diagnosed? This condition may be diagnosed based on:  Your symptoms.  Your medical history.  A physical exam. You may be asked to cough or strain while standing. These actions increase the pressure inside your abdomen and force the hernia through the opening in your muscles. Your health care provider may try to reduce the hernia by pressing on it.  Imaging studies, such as an ultrasound or CT scan. How is this treated? This condition is treated with surgery. If you have a strangulated hernia, surgery is done as soon as possible. If your hernia is small and not incarcerated, you may be asked to lose some weight before surgery. Follow these instructions at home:  Follow instructions from your health care provider  about eating or drinking restrictions.  If you are overweight, your health care provider may recommend that you increase your activity level and eat a healthier diet.  Do not lift anything that is heavier than 10 lb (4.5 kg).  Return to your normal activities as told by your health care provider. Ask your health care provider what activities are safe for you. You may need to  avoid activities that increase pressure on your hernia.  Take over-the-counter and prescription medicines only as told by your health care provider.  Keep all follow-up visits as told by your health care provider. This is important. Contact a health care provider if:  Your hernia gets larger.  Your hernia becomes painful. Get help right away if:  Your hernia becomes increasingly painful.  You have pain along with any of the following: ? Changes in skin color in the area of the hernia. ? Nausea. ? Vomiting. ? Fever. Summary  A ventral hernia is a bulge of tissue from inside the abdomen that pushes through a weak area of the muscles that form the front wall of the abdomen.  This condition is treated with surgery, which may be urgent depending on your hernia.  Do not lift anything that is heavier than 10 lb (4.5 kg), and follow activity instructions from your health care provider. This information is not intended to replace advice given to you by your health care provider. Make sure you discuss any questions you have with your health care provider. Document Revised: 11/21/2017 Document Reviewed: 04/30/2017 Elsevier Patient Education  [place].

## 2019-12-17 NOTE — Progress Notes (Signed)
Outpatient Surgical Follow Up  12/17/2019  Carla [name] is an 59 y.o. female.   Chief Complaint  Patient presents with  . New Patient (Initial Visit)    new pt seen in er/addmission hernia found on CT    HPI: Carla [name] is a 59 year old female well-known to me with multiple comorbidities including coronary artery disease, peripheral vascular disease, borderline diabetes and active smoker who I performed laparotomy and small bowel resection for a bowel obstruction secondary to small bowel ischemic stricture.  Of note she also had significant mesentery vascular disease requiring stent which now is thrombosed.  There is no evidence of acute ischemia within the intestines.  She now has developed a large ventral hernia with loss of domain.  She has intermittent abdominal pain related to the ventral hernia that is moderate in intensity.  No evidence of incarceration or strangulation.  Her BMI is 44.8. She went to a bariatric surgeon for evaluation of a sleeve gastrectomy.  I do think that she is still interested in this but they want her to quit smoking.  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  She has significant history of CHF with a preserved ejection fraction, COPD requires inhalers but no oxygen.  She does have a history of chronic anemia.  CBC was completely normal other than a hemoglobin of 10.9.  CMP was normal.  1 1 AC was 6.3.  Past Medical History:  Diagnosis Date  . Abdominal aortic atherosclerosis ([place])    a. 05/2017 CTA abd/pelvis: significant atherosclerotic dzs of the infrarenal abd Ao w/ some mural thrombus. No aneurysm or dissection.  . Abdominal pain   . Acute focal ischemia of small intestine ([facility])   . Acute right-sided low back pain with right-sided sciatica 06/13/2017  . AKI (acute kidney injury) ([place]) 07/29/2017  . Baker's cyst of knee, right    a. 07/2016 U/S: 4.1 x 1.4 x 2.9 cystic structure in R poplitetal fossa.  . Bell's palsy   . Borderline diabetes   . Cancer  ([place])    skin cancer on nose  . Chest pain    a. 08/2012 Lexiscan MV: EF 54%, non ischemia/infarct.  . CHF (congestive heart failure) ([place])   . Chronic idiopathic constipation   . Chronic mesenteric ischemia ([place])   . COPD (chronic obstructive pulmonary disease) ([place])   . Diastolic dysfunction    a. 05/2017 Echo: Ef 60-65%, no rwma, Gr1 DD, no source of cardiac emboli.  . 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 05/2017 CTA Abd/pelvis: apparent thrombus or embolus in prox SMA (70-90%); b. 05/2017 catheter directed tPA, mechanical thrombectomy, and stenting of the SMA.  . Essential hypertension with goal blood pressure less than 140/90 01/19/2016  . Gastroesophageal reflux disease 01/19/2016  . GERD (gastroesophageal reflux disease)   . History of kidney stones   . Hyperlipidemia   . Hypertension   . Hypotension 07/29/2017  . Morbid obesity with BMI of 40.0-44.9, adult ([place])   . Occlusive mesenteric ischemia ([place]) 06/19/2017  . Personal history of other malignant neoplasm of skin 06/01/2016  . Pre-diabetes   . Primary osteoarthritis of both knees 06/13/2017  . Recurrent major depressive disorder ([place]) 01/19/2016  . SBO (small bowel obstruction) ([place]) 08/06/2017  . 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 Urinary tract infection 01/09/2014    Past Surgical History:  Procedure Laterality Date  . AMPUTATION TOE Left 12/09/2019   Procedure: AMPUTATION TOE MPJ left;  Surgeon:  Caroline More, DPM;  Location: [facility];  Service: Podiatry;  Laterality: Left;  . APPENDECTOMY    . CHOLECYSTECTOMY    . LAPAROTOMY N/A 08/08/2017   Procedure: EXPLORATORY LAPAROTOMY POSSIBLE BOWEL RESECTION;  Surgeon: Jules Husbands, MD;  Location: [facility];  Service: General;  Laterality: N/A;  . LOWER EXTREMITY ANGIOGRAPHY Left 11/17/2019   Procedure: LOWER EXTREMITY ANGIOGRAPHY;  Surgeon: Algernon Huxley, MD;  Location: [facility];  Service: Cardiovascular;  Laterality: Left;  . LOWER EXTREMITY  INTERVENTION N/A 11/19/2019   Procedure: LOWER EXTREMITY INTERVENTION;  Surgeon: Algernon Huxley, MD;  Location: [facility];  Service: Cardiovascular;  Laterality: N/A;  . TEE WITHOUT CARDIOVERSION N/A 06/22/2017   Procedure: TRANSESOPHAGEAL ECHOCARDIOGRAM (TEE);  Surgeon: Wellington Hampshire, MD;  Location: [facility];  Service: Cardiovascular;  Laterality: N/A;  . TOTAL KNEE ARTHROPLASTY Right 07/11/2018   Procedure: TOTAL KNEE ARTHROPLASTY;  Surgeon: Corky Mull, MD;  Location: [facility];  Service: Orthopedics;  Laterality: Right;  [name] VAGINAL HYSTERECTOMY    . VISCERAL ARTERY INTERVENTION N/A 06/20/2017   Procedure: Visceral Artery Intervention, possible aortic thrombectomy;  Surgeon: Algernon Huxley, MD;  Location: [facility];  Service: Cardiovascular;  Laterality: N/A;  . VISCERAL ARTERY INTERVENTION N/A 01/28/2018   Procedure: VISCERAL ARTERY INTERVENTION;  Surgeon: Algernon Huxley, MD;  Location: [facility];  Service: Cardiovascular;  Laterality: N/A;    Family History  Problem Relation Age of Onset  . Hypertension Mother   . Heart disease Mother   . Heart attack Mother   . Breast cancer Mother   . Hypertension Father   . Breast cancer Paternal Aunt     Social History:  reports that she has been smoking cigarettes. She has been smoking about 0.50 packs per day. She has never used smokeless tobacco. She reports that she does not drink alcohol or use drugs.  Allergies:  Allergies  Allergen Reactions  . Gabapentin     Tremors   . Bactrim [Sulfamethoxazole-Trimethoprim] Itching    Medications reviewed.    ROS Full ROS performed and is otherwise negative other than what is stated in HPI   BP 128/82   Pulse 67   Temp 97.7 F (36.5 C) (Temporal)   Resp 14   Ht 5\' 2"  (1.575 m)   Wt 245 lb 3.2 oz (111.2 kg)   SpO2 97%   BMI 44.85 kg/m   Physical Exam Vitals and nursing note reviewed. Exam conducted with a chaperone present.  Constitutional:       General: She is not in acute distress.    Appearance: Normal appearance. She is normal weight.  Eyes:     General: No scleral icterus.       Right eye: No discharge.        Left eye: No discharge.     Extraocular Movements: Extraocular movements intact.  Cardiovascular:     Rate and Rhythm: Normal rate and regular rhythm.  Pulmonary:     Effort: Pulmonary effort is normal. No respiratory distress.     Breath sounds: No stridor. No wheezing or rhonchi.  Abdominal:     General: Abdomen is flat. There is no distension.     Palpations: There is no mass.     Tenderness: There is no abdominal tenderness. There is no guarding.     Hernia: No hernia is present.  Genitourinary:    General: Normal vulva.  Musculoskeletal:     Cervical back:  Normal range of motion and neck supple. No rigidity or tenderness.  Lymphadenopathy:     Cervical: No cervical adenopathy.  Skin:    General: Skin is warm and dry.     Capillary Refill: Capillary refill takes less than 2 seconds.  Neurological:     General: No focal deficit present.     Mental Status: She is alert and oriented to person, place, and time.  Psychiatric:        Mood and Affect: Mood normal.        Behavior: Behavior normal.        Thought Content: Thought content normal.        Judgment: Judgment normal.      Assessment/Plan: Carla [name] is a 59 year old female morbidly obese with ventral hernia and loss of domain of the abdominal wall.  He does have significant comorbidities including COPD, active smoker, obesity and significant peripheral vascular disease.  I had an extensive discussion with the patient regarding her disease process.  In order to attempt a ventral hernia repair she will need to be optimized from a medical perspective.  That will mean that she will have to improve her BMI and a least I will be contemplated to perform surgery if she loses 35 pounds or more.  She also will need to quit smoking before attempting a major  abdominal wall reconstruction.  Discussed with her the possibility about doing gastric of leaf before definitive ventral hernia surgery . We will refer her back to bariatrics to see if she can potentially benefit from a gastric sleeve.  Currently there is no need for emergent surgical intervention and I will see her back in about 3 to 4 months hopefully at that time her medical issues have improved.   Greater than 50% of the 40 minutes  visit was spent in counseling/coordination of care   Caroleen Hamman, MD [facility] Surgeon

## 2019-12-23 ENCOUNTER — Encounter (INDEPENDENT_AMBULATORY_CARE_PROVIDER_SITE_OTHER): Payer: Self-pay | Admitting: Nurse Practitioner

## 2019-12-23 ENCOUNTER — Ambulatory Visit (INDEPENDENT_AMBULATORY_CARE_PROVIDER_SITE_OTHER): Payer: Medicare Other | Admitting: Nurse Practitioner

## 2019-12-23 ENCOUNTER — Ambulatory Visit (INDEPENDENT_AMBULATORY_CARE_PROVIDER_SITE_OTHER): Payer: Medicare Other

## 2019-12-23 ENCOUNTER — Other Ambulatory Visit: Payer: Self-pay

## 2019-12-23 VITALS — BP 117/77 | HR 63 | Resp 12 | Ht 62.0 in | Wt 244.0 lb

## 2019-12-23 DIAGNOSIS — I10 Essential (primary) hypertension: Secondary | ICD-10-CM | POA: Diagnosis not present

## 2019-12-23 DIAGNOSIS — I480 Paroxysmal atrial fibrillation: Secondary | ICD-10-CM | POA: Diagnosis not present

## 2019-12-23 DIAGNOSIS — I998 Other disorder of circulatory system: Secondary | ICD-10-CM

## 2019-12-23 NOTE — Progress Notes (Signed)
SUBJECTIVE:  Patient ID: Carla [name], female    DOB: [birth year], 60 y.o.   MRN: [id number] Chief Complaint  Patient presents with  . Follow-up    3-4 week ABI    HPI  Carla [name] is a 59 y.o. female The patient returns to the office for followup and review of the noninvasive studies. There have been no interval changes in lower extremity symptoms. No interval shortening of the patient's claudication distance or development of rest pain symptoms.  The patient recently underwent amputation of the left third toe due to gangrene.  Per the patient she recently saw her podiatrist and the amputation site is healing well.  She denies any other issues with her toes of that foot.  There have been no significant changes to the patient's overall health care.  The patient denies amaurosis fugax or recent TIA symptoms. There are no recent neurological changes noted. The patient denies history of DVT, PE or superficial thrombophlebitis. The patient denies recent episodes of angina or shortness of breath.   ABI Rt=1.04 and Lt=1.20  (previous ABI's Rt=1.04 and Lt=1.01) Duplex ultrasound of the bilateral tibial arteries reveals triphasic waveforms with good toe waveforms on the left and dampened on the right. Past Medical History:  Diagnosis Date  . Abdominal aortic atherosclerosis ([place])    a. 05/2017 CTA abd/pelvis: significant atherosclerotic dzs of the infrarenal abd Ao w/ some mural thrombus. No aneurysm or dissection.  . Abdominal pain   . Acute focal ischemia of small intestine (HCC)   . Acute right-sided low back pain with right-sided sciatica 06/13/2017  . AKI (acute kidney injury) ([place]) 07/29/2017  . Baker's cyst of knee, right    a. 07/2016 U/S: 4.1 x 1.4 x 2.9 cystic structure in R poplitetal fossa.  . Bell's palsy   . Borderline diabetes   . Cancer ([place])    skin cancer on nose  . Chest pain    a. 08/2012 Lexiscan MV: EF 54%, non ischemia/infarct.  . CHF (congestive heart failure)  ([place])   . Chronic idiopathic constipation   . Chronic mesenteric ischemia ([place])   . COPD (chronic obstructive pulmonary disease) ([place])   . Diastolic dysfunction    a. 05/2017 Echo: Ef 60-65%, no rwma, Gr1 DD, no source of cardiac emboli.  . 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 05/2017 CTA Abd/pelvis: apparent thrombus or embolus in prox SMA (70-90%); b. 05/2017 catheter directed tPA, mechanical thrombectomy, and stenting of the SMA.  . Essential hypertension with goal blood pressure less than 140/90 01/19/2016  . Gastroesophageal reflux disease 01/19/2016  . GERD (gastroesophageal reflux disease)   . History of kidney stones   . Hyperlipidemia   . Hypertension   . Hypotension 07/29/2017  . Morbid obesity with BMI of 40.0-44.9, adult ([place])   . Occlusive mesenteric ischemia ([place]) 06/19/2017  . Personal history of other malignant neoplasm of skin 06/01/2016  . Pre-diabetes   . Primary osteoarthritis of both knees 06/13/2017  . Recurrent major depressive disorder ([place]) 01/19/2016  . SBO (small bowel obstruction) ([place]) 08/06/2017  . 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 Urinary tract infection 01/09/2014    Past Surgical History:  Procedure Laterality Date  . AMPUTATION TOE Left 12/09/2019   Procedure: AMPUTATION TOE MPJ left;  Surgeon: Caroline More, DPM;  Location: [facility];  Service: Podiatry;  Laterality: Left;  . APPENDECTOMY    . CHOLECYSTECTOMY    . LAPAROTOMY N/A 08/08/2017   Procedure:  EXPLORATORY LAPAROTOMY POSSIBLE BOWEL RESECTION;  Surgeon: Jules Husbands, MD;  Location: [facility];  Service: General;  Laterality: N/A;  . LOWER EXTREMITY ANGIOGRAPHY Left 11/17/2019   Procedure: LOWER EXTREMITY ANGIOGRAPHY;  Surgeon: Algernon Huxley, MD;  Location: [facility];  Service: Cardiovascular;  Laterality: Left;  . LOWER EXTREMITY INTERVENTION N/A 11/19/2019   Procedure: LOWER EXTREMITY INTERVENTION;  Surgeon: Algernon Huxley, MD;  Location: [facility];   Service: Cardiovascular;  Laterality: N/A;  . TEE WITHOUT CARDIOVERSION N/A 06/22/2017   Procedure: TRANSESOPHAGEAL ECHOCARDIOGRAM (TEE);  Surgeon: Wellington Hampshire, MD;  Location: [facility];  Service: Cardiovascular;  Laterality: N/A;  . TOTAL KNEE ARTHROPLASTY Right 07/11/2018   Procedure: TOTAL KNEE ARTHROPLASTY;  Surgeon: Corky Mull, MD;  Location: [facility];  Service: Orthopedics;  Laterality: Right;  [name] VAGINAL HYSTERECTOMY    . VISCERAL ARTERY INTERVENTION N/A 06/20/2017   Procedure: Visceral Artery Intervention, possible aortic thrombectomy;  Surgeon: Algernon Huxley, MD;  Location: [facility];  Service: Cardiovascular;  Laterality: N/A;  . VISCERAL ARTERY INTERVENTION N/A 01/28/2018   Procedure: VISCERAL ARTERY INTERVENTION;  Surgeon: Algernon Huxley, MD;  Location: [facility];  Service: Cardiovascular;  Laterality: N/A;    Social History   Socioeconomic History  . Marital status: Single    Spouse name: Not on file  . Number of children: Not on file  . Years of education: Not on file  . Highest education level: Not on file  Occupational History  . Occupation: Unemployed  Tobacco Use  . Smoking status: Current Every Day Smoker    Packs/day: 0.50    Types: Cigarettes  . Smokeless tobacco: Never Used  Substance and Sexual Activity  . Alcohol use: No  . Drug use: No  . Sexual activity: Never  Other Topics Concern  . Not on file  Social History Narrative   Lives in [place] with boyfriend.  Does not routinely exercise.   Social Determinants of Health   Financial Resource Strain:   . Difficulty of Paying Living Expenses: Not on file  Food Insecurity:   . Worried About Charity fundraiser in the Last Year: Not on file  . Ran Out of Food in the Last Year: Not on file  Transportation Needs:   . Lack of Transportation (Medical): Not on file  . Lack of Transportation (Non-Medical): Not on file  Physical Activity:   . Days of Exercise per Week: Not on file  .  Minutes of Exercise per Session: Not on file  Stress:   . Feeling of Stress : Not on file  Social Connections:   . Frequency of Communication with Friends and Family: Not on file  . Frequency of Social Gatherings with Friends and Family: Not on file  . Attends Religious Services: Not on file  . Active Member of Clubs or Organizations: Not on file  . Attends Archivist Meetings: Not on file  . Marital Status: Not on file  Intimate Partner Violence:   . Fear of Current or Ex-Partner: Not on file  . Emotionally Abused: Not on file  . Physically Abused: Not on file  . Sexually Abused: Not on file    Family History  Problem Relation Age of Onset  . Hypertension Mother   . Heart disease Mother   . Heart attack Mother   . Breast cancer Mother   . Hypertension Father   . Breast cancer Paternal Aunt  Allergies  Allergen Reactions  . Gabapentin     Tremors   . Bactrim [Sulfamethoxazole-Trimethoprim] Itching     Review of Systems   Review of Systems: Negative Unless Checked Constitutional: [] Weight loss  [] Fever  [] Chills Cardiac: [] Chest pain   [x]  Atrial Fibrillation  [] Palpitations   [] Shortness of breath when laying flat   [] Shortness of breath with exertion. [] Shortness of breath at rest Vascular:  [] Pain in legs with walking   [] Pain in legs with standing [] Pain in legs when laying flat   [] Claudication    [] Pain in feet when laying flat    [] History of DVT   [] Phlebitis   [] Swelling in legs   [] Varicose veins   [] Non-healing ulcers Pulmonary:   [] Uses home oxygen   [] Productive cough   [] Hemoptysis   [] Wheeze  [x] COPD   [] Asthma Neurologic:  [] Dizziness   [] Seizures  [] Blackouts [] History of stroke   [] History of TIA  [] Aphasia   [] Temporary Blindness   [] Weakness or numbness in arm   [] Weakness or numbness in leg Musculoskeletal:   [] Joint swelling   [] Joint pain   [] Low back pain  []  History of Knee Replacement [x] Arthritis [] back Surgeries  []  Spinal Stenosis      Hematologic:  [] Easy bruising  [] Easy bleeding   [] Hypercoagulable state   [] Anemic Gastrointestinal:  [] Diarrhea   [] Vomiting  [] Gastroesophageal reflux/heartburn   [] Difficulty swallowing. [] Abdominal pain Genitourinary:  [] Chronic kidney disease   [] Difficult urination  [] Anuric   [] Blood in urine [] Frequent urination  [] Burning with urination   [] Hematuria Skin:  [] Rashes   [] Ulcers [x] Wounds Psychological:  [x] History of anxiety   []  History of major depression  []  Memory Difficulties      OBJECTIVE:   Physical Exam  BP 117/77   Pulse 63   Resp 12   Ht 5\' 2"  (1.575 m)   Wt 244 lb (110.7 kg)   BMI 44.63 kg/m   Gen: WD/WN, NAD Head: [place]/AT, No temporalis wasting.  Ear/Nose/Throat: Hearing grossly intact, nares w/o erythema or drainage Eyes: PER, EOMI, sclera nonicteric.  Neck: Supple, no masses.  No JVD.  Pulmonary:  Good air movement, no use of accessory muscles.  Cardiac: RRR Vascular:  Vessel Right Left  Radial Palpable Palpable   Gastrointestinal: soft, non-distended. No guarding/no peritoneal signs.  Musculoskeletal: M/S 5/5 throughout.  No deformity or atrophy.  Neurologic: Pain and light touch intact in extremities.  Symmetrical.  Speech is fluent. Motor exam as listed above. Psychiatric: Judgment intact, Mood & affect appropriate for pt's clinical situation. Dermatologic: No Venous rashes. No Ulcers Noted.  No changes consistent with cellulitis. Lymph : No Cervical lymphadenopathy, no lichenification or skin changes of chronic lymphedema.       ASSESSMENT AND PLAN:  1. Ischemia of left lower extremity Currently patient is taking her anticoagulation as ordered.  Discussed with patient that she can probably anticipate lifetime anticoagulation on Eliquis.  She denies any issues with taking it.  Recommend:  The patient has evidence of atherosclerosis of the lower extremities with claudication.  The patient does not voice lifestyle limiting changes at this point in  time.  Noninvasive studies do not suggest clinically significant change.  No invasive studies, angiography or surgery at this time The patient should continue walking and begin a more formal exercise program.  The patient should continue antiplatelet therapy and aggressive treatment of the lipid abnormalities  No changes in the patient's medications at this time  The patient should continue wearing graduated compression  socks 10-15 mmHg strength to control the mild edema.     2. Essential hypertension with goal blood pressure less than 140/90 Continue antihypertensive medications as already ordered, these medications have been reviewed and there are no changes at this time.   3. Paroxysmal atrial fibrillation (HCC) Patient currently in sinus rhythm.   Current Outpatient Medications on File Prior to Visit  Medication Sig Dispense Refill  . acetaminophen (TYLENOL) 500 MG tablet Take 1,000 mg by mouth every 6 (six) hours as needed for moderate pain or headache.    . albuterol (VENTOLIN HFA) 108 (90 Base) MCG/ACT inhaler Inhale 2 puffs into the lungs every 6 (six) hours as needed for wheezing or shortness of breath.     [name] amLODipine (NORVASC) 5 MG tablet Take 1 tablet (5 mg total) by mouth daily. 30 tablet 2  . apixaban (ELIQUIS) 5 MG TABS tablet Take 5 mg by mouth 2 (two) times daily.    [name] aspirin EC 81 MG EC tablet Take 1 tablet (81 mg total) by mouth daily. 90 tablet 3  . atorvastatin (LIPITOR) 80 MG tablet Take 80 mg by mouth at bedtime.     [name] buPROPion (WELLBUTRIN XL) 150 MG 24 hr tablet Take 150 mg by mouth daily.     . clotrimazole-betamethasone (LOTRISONE) cream Apply 1 application topically 2 (two) times daily as needed (for dry skin).     . DULoxetine (CYMBALTA) 60 MG capsule Take 60 mg by mouth daily.    . ferrous sulfate 325 (65 FE) MG tablet Take 325 mg by mouth daily.    [name] gabapentin (NEURONTIN) 100 MG capsule Take 1 capsule (100 mg total) by mouth 2 (two) times daily. 180  capsule 3  . HYDROcodone-acetaminophen (NORCO/VICODIN) 5-325 MG tablet Take 1 tablet by mouth every 6 (six) hours as needed for moderate pain.    [name] lidocaine (LIDODERM) 5 % Place 1 patch onto the skin daily. Remove & Discard patch within 12 hours or as directed by MD 30 patch 0  . methylPREDNISolone (MEDROL DOSEPAK) 4 MG TBPK tablet Take 4-24 mg by mouth See admin instructions. (Typical regimens for 21 tablet dose packs of Methylprednisolone 4mg , Prednisone 5mg , and Prednisone 10mg ) Day 1: 2 tabs before breakfast, 1 tab after lunch, 1 tab after supper, and 2 tabs at bedtime. Day 2: 1 tab before breakfast, 1 tab after lunch, 1 tab after supper, and 2 tabs at bedtime. Day 3: 1 tab before breakfast, 1 tab after lunch, 1 tab after supper, and 1 tab at bedtime. Day 4: 1 tab before breakfast, 1 tab after lunch, and 1 tab at bedtime. Day 5: 1 tab before breakfast and 1 tab at bedtime. Day 6: 1 tab before breakfast.    . metoprolol tartrate (LOPRESSOR) 50 MG tablet Take 1 tablet (50 mg total) by mouth 2 (two) times daily. 30 tablet 0  . Multiple Vitamin (MULTIVITAMIN WITH MINERALS) TABS tablet Take 1 tablet by mouth daily. 90 tablet 3  . omeprazole (PRILOSEC) 20 MG capsule Take 20 mg by mouth 2 (two) times daily before a meal.    . ondansetron (ZOFRAN) 4 MG tablet TAKE 1 TABLET BY MOUTH EVERY 8 HOURS AS NEEDED FOR UP TO 7 DAYS FOR NAUSEA OR VOMITING    . oxyCODONE-acetaminophen (PERCOCET/ROXICET) 5-325 MG tablet Take by mouth.    . topiramate (TOPAMAX) 25 MG tablet Take 25 mg by mouth 2 (two) times daily.     [name] topiramate (TOPAMAX) 50 MG tablet Take by mouth.    [name]  valACYclovir (VALTREX) 1000 MG tablet Take by mouth.     No current facility-administered medications on file prior to visit.    There are no Patient Instructions on file for this visit. No follow-ups on file.   Kris Hartmann, NP  This note was completed with Sales executive.  Any errors are purely unintentional.

## 2019-12-25 IMAGING — DX DG KNEE 1-2V PORT*R*
2 series · 2 of 2 positions shown · non-contrast
Comparison: 05/05/2017.

CLINICAL DATA: Right knee replacement.

EXAM:
PORTABLE RIGHT KNEE - 1-2 VIEW

[knee ap]
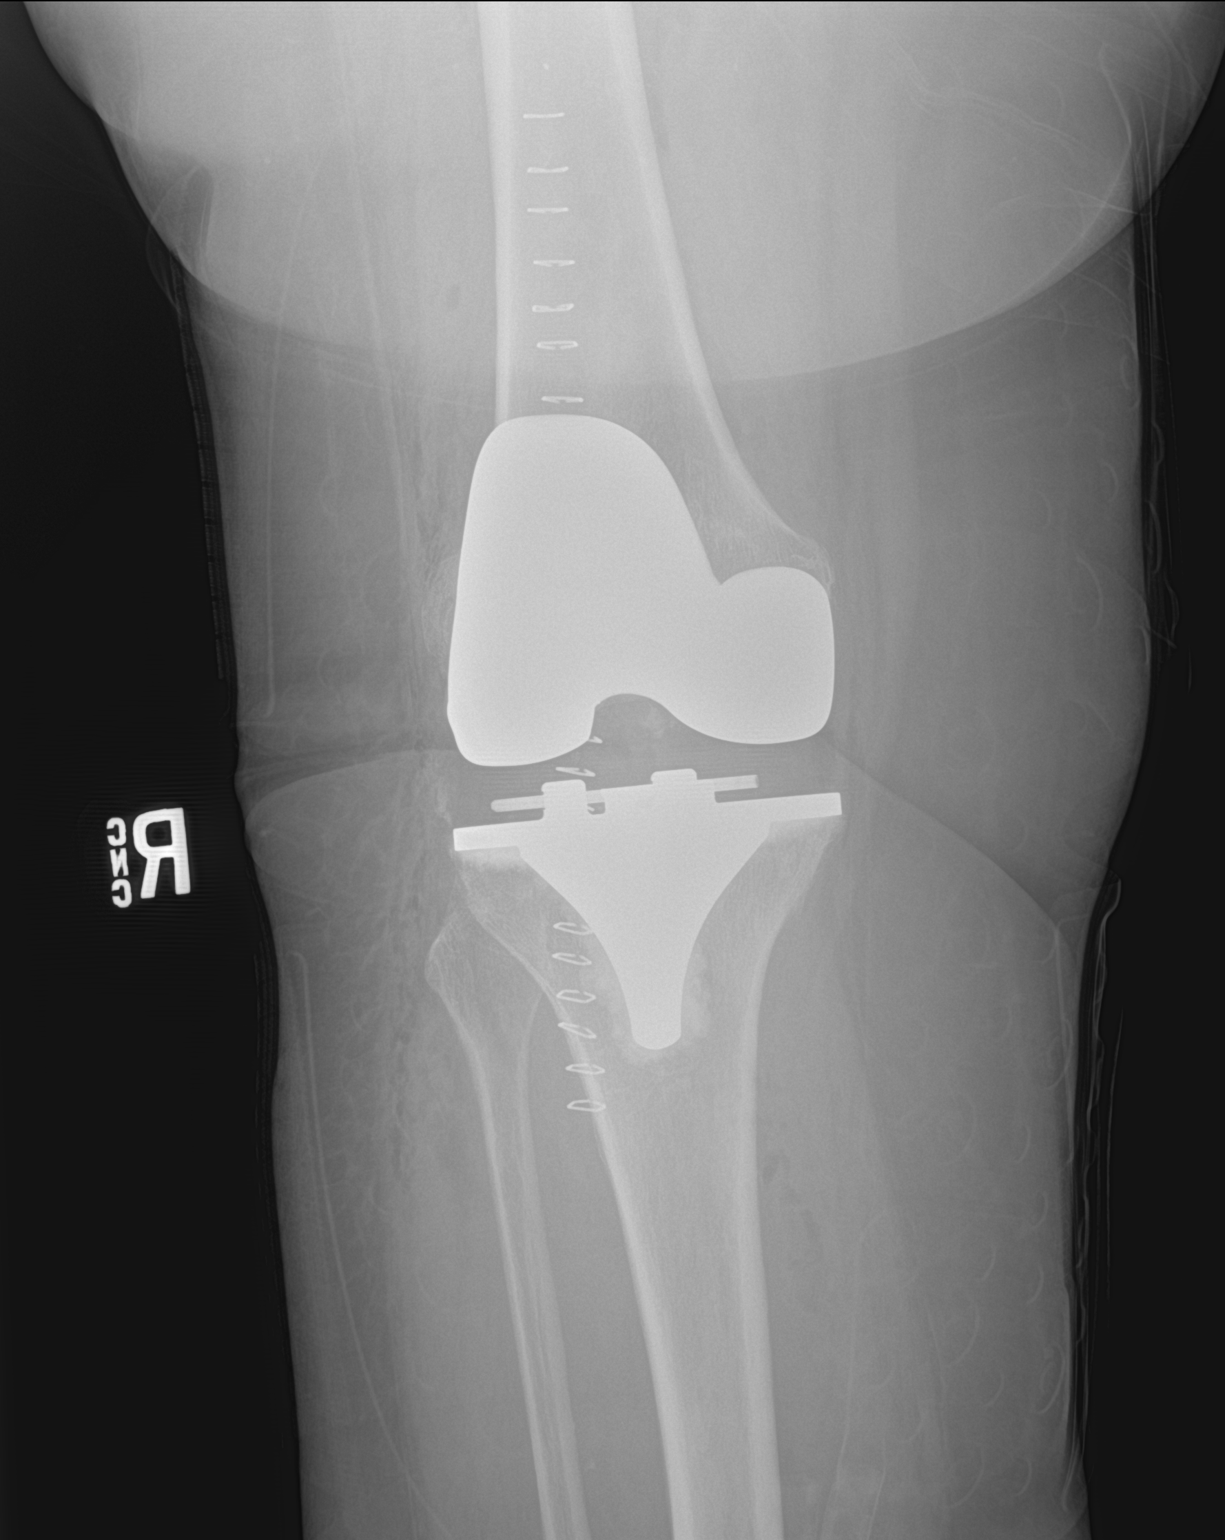

[knee lat]
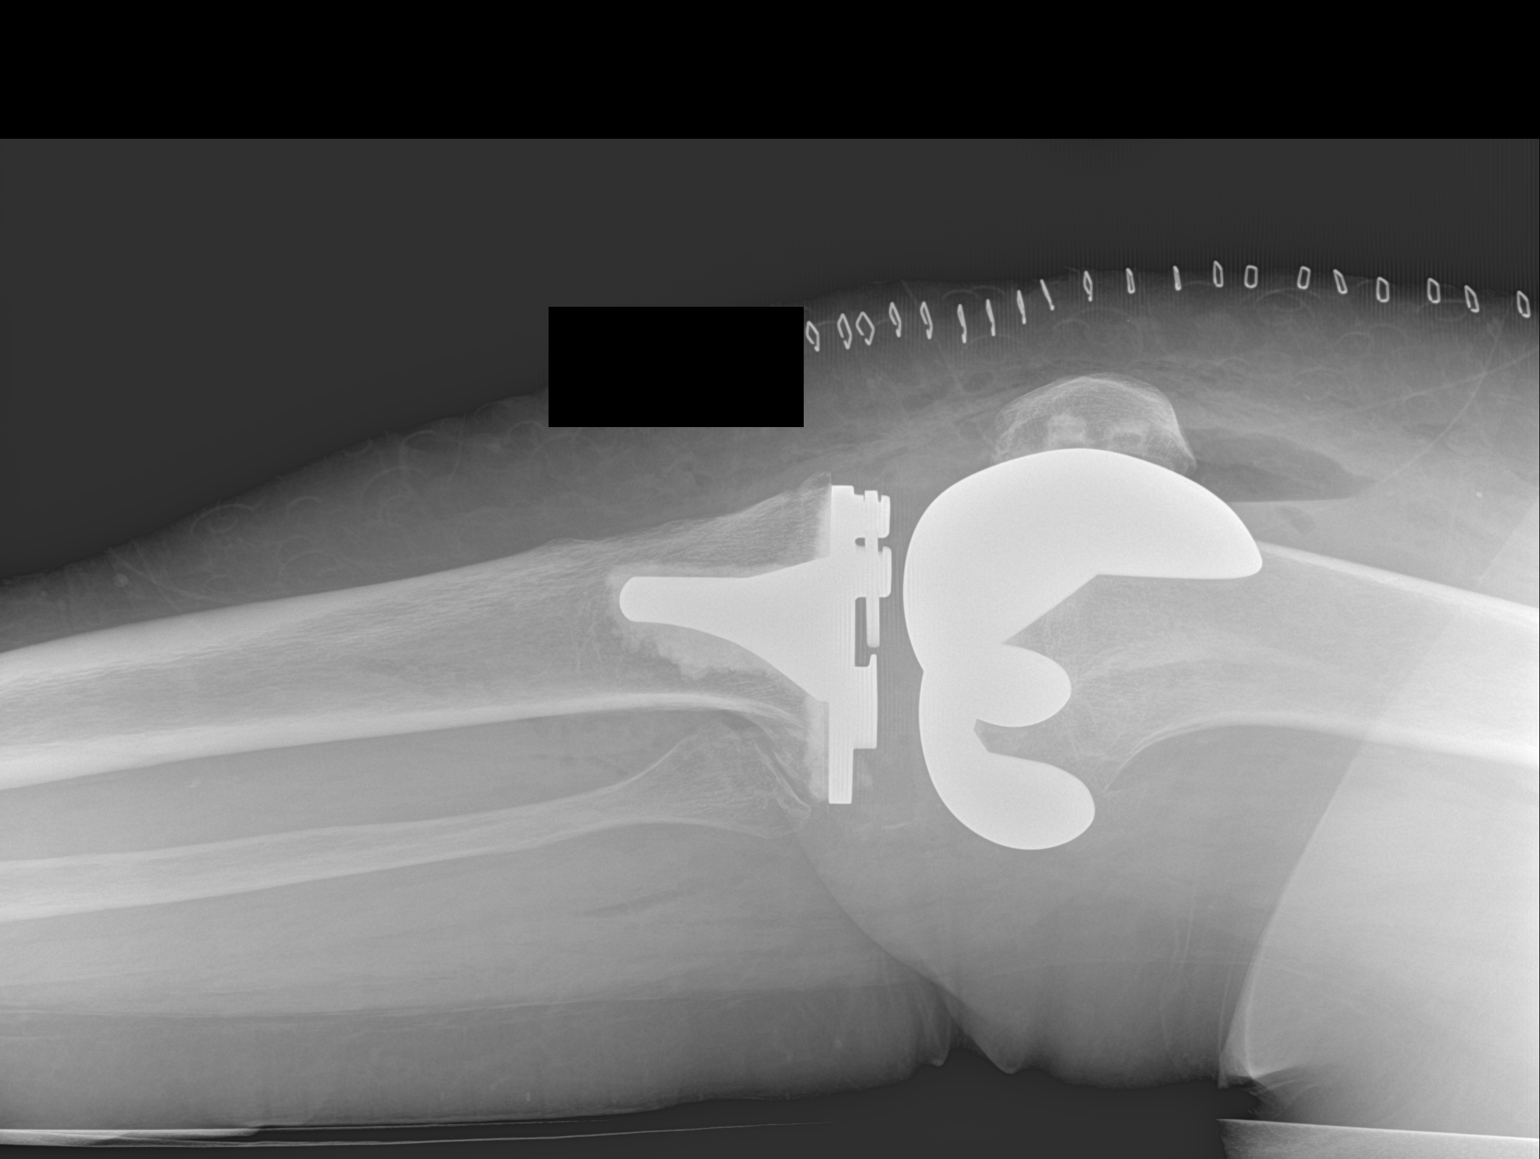

[2 of 2 positions shown; findings below may reference images not displayed]

FINDINGS: Total right knee replacement. Hardware intact. Anatomic alignment.
No acute bony abnormality.
IMPRESSION: Total right knee replacement with anatomic alignment.

## 2019-12-27 IMAGING — US US EXTREM LOW VENOUS*R*
1 series · 13 of 24 positions shown · non-contrast
Comparison: None.

CLINICAL DATA: Pain and edema of right lower extremity. Two days
post right knee arthroplasty.



[Series 1: us extrem low venous*right* · 13 of 33 slices shown]
[im 1/33]
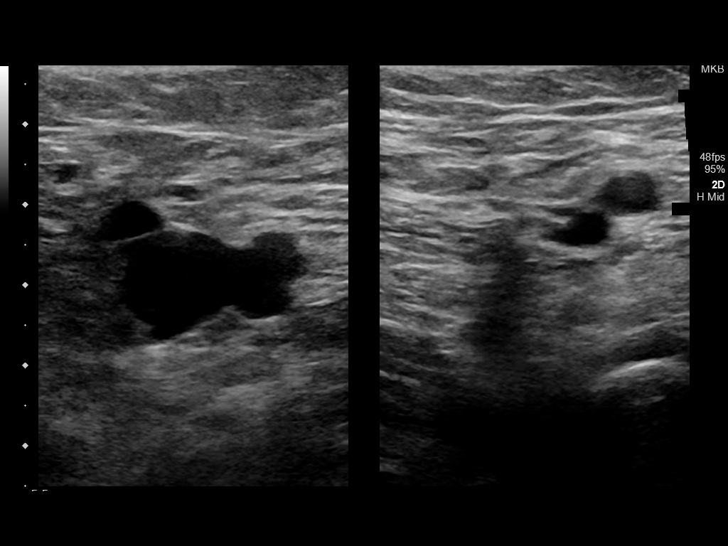
[im 3/33]
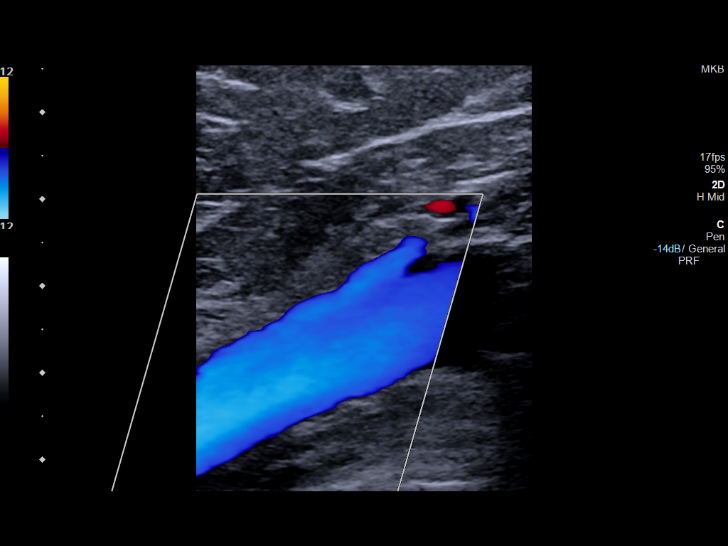
[im 6/33]
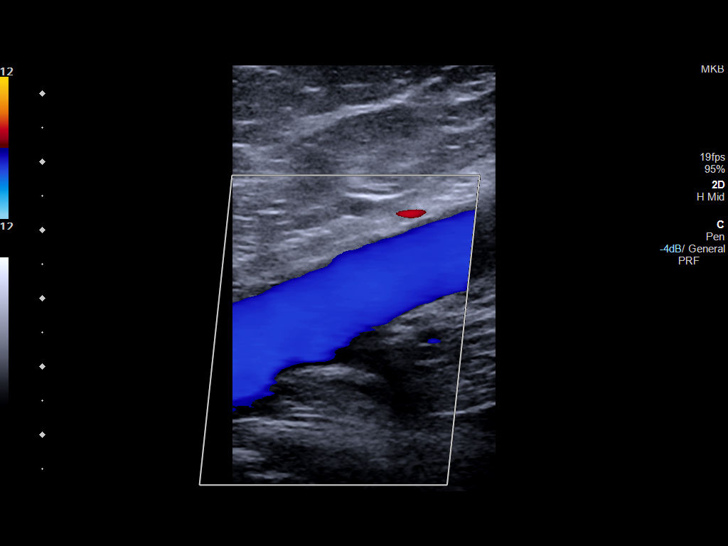
[im 9/33]
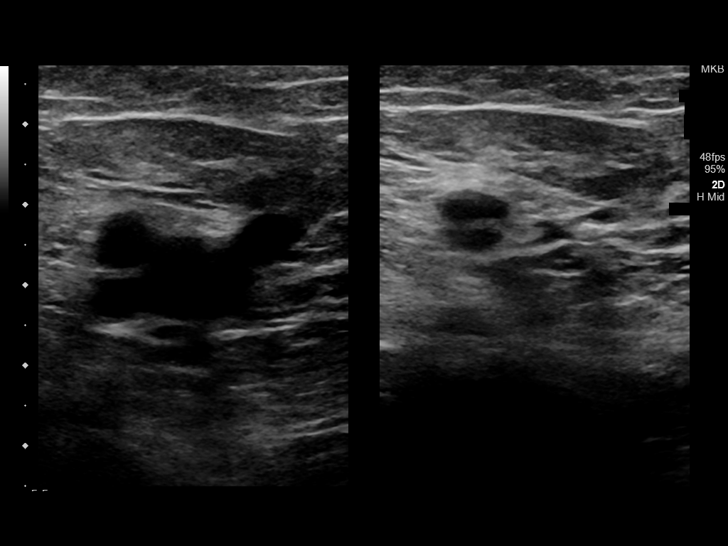
[im 12/33]
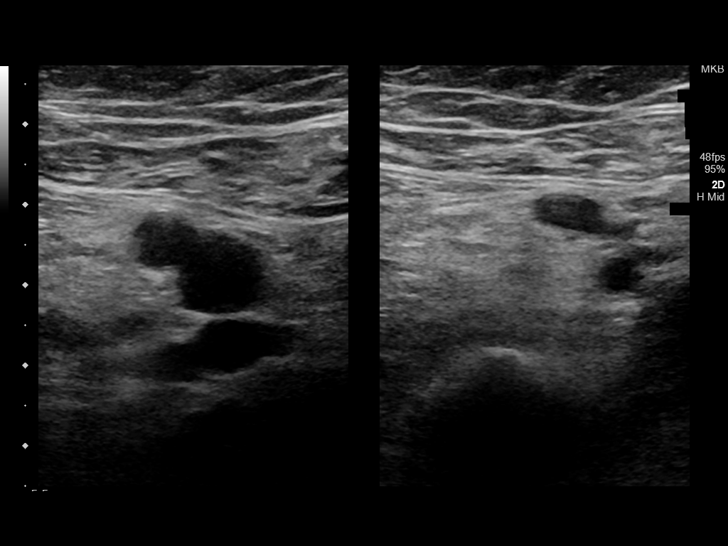
[im 14/33]
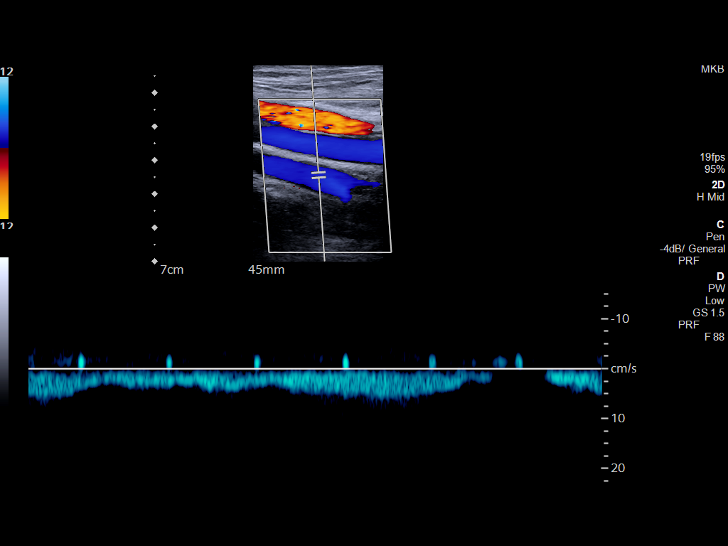
[im 17/33]
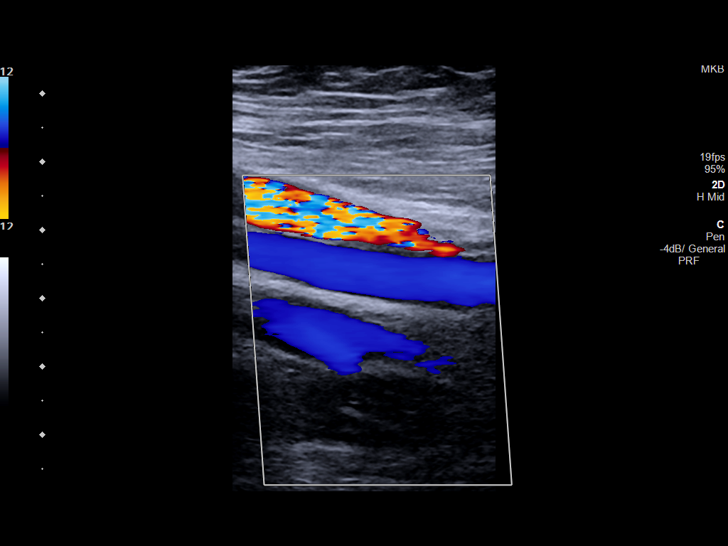
[im 19/33]
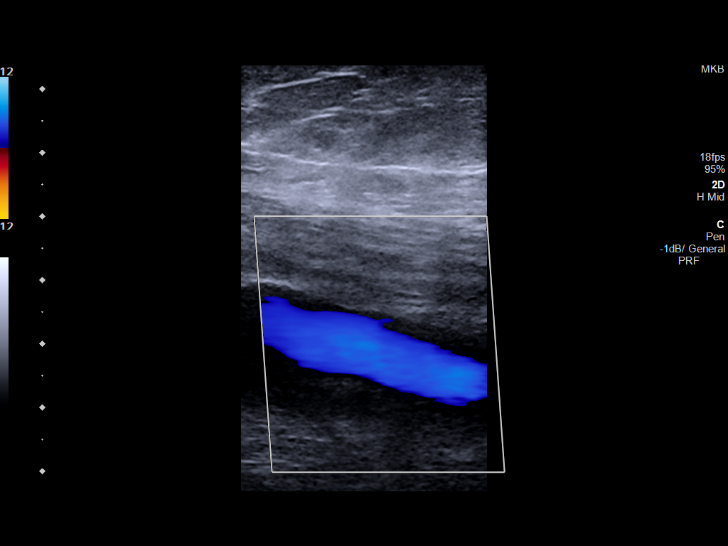
[im 21/33]
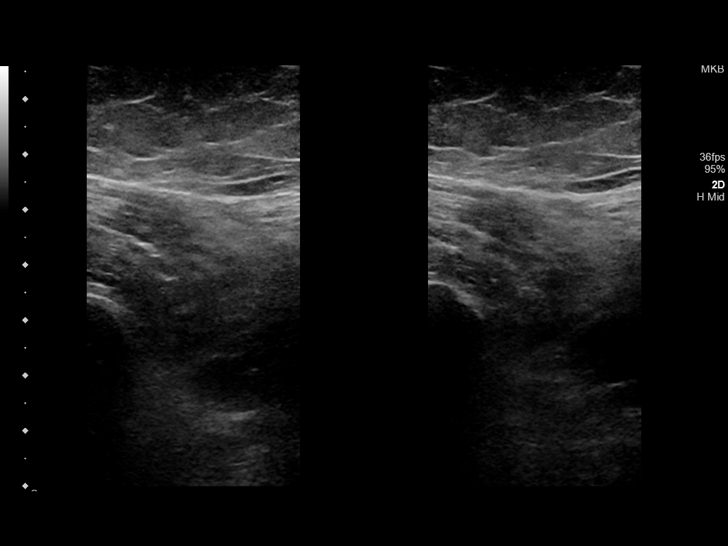
[im 24/33]
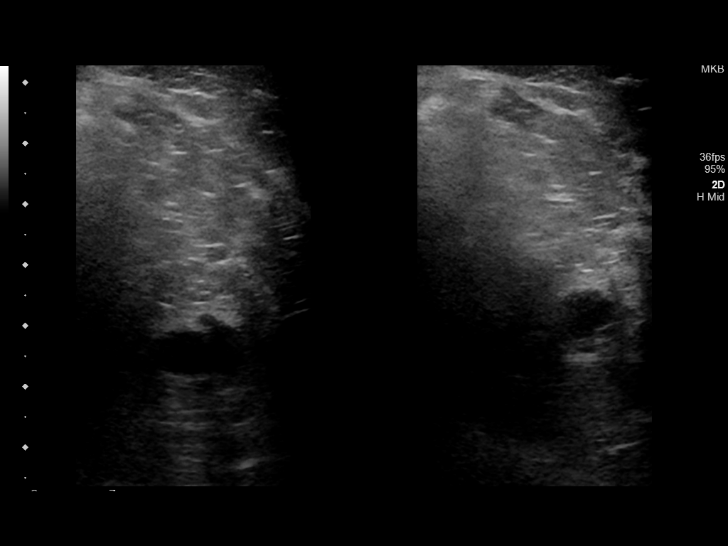
[im 27/33]
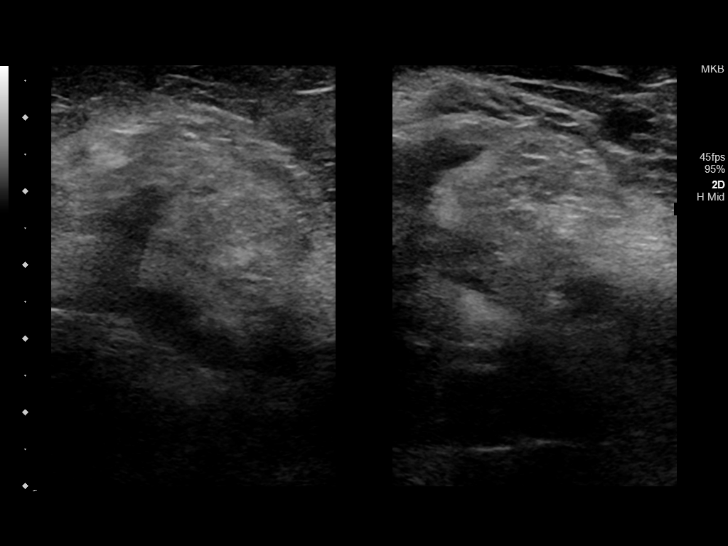
[im 30/33]
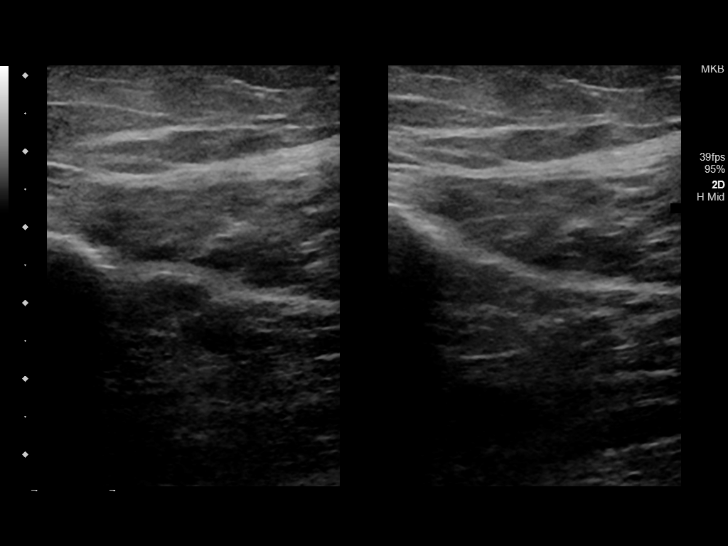
[im 33/33]
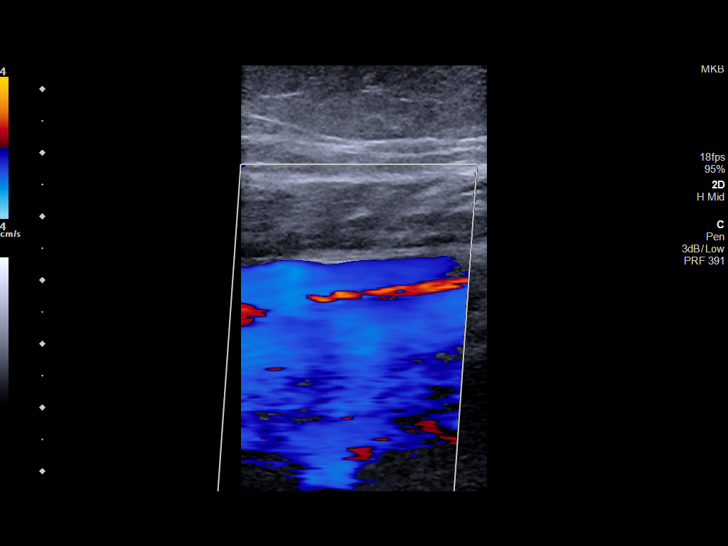

[13 of 24 positions shown; findings below may reference images not displayed]

FINDINGS: Contralateral Common Femoral Vein: Respiratory phasicity is normal
and symmetric with the symptomatic side. No evidence of thrombus.
Normal compressibility.

Common Femoral Vein: No evidence of thrombus. Normal
compressibility, respiratory phasicity and response to augmentation.

Saphenofemoral Junction: No evidence of thrombus. Normal
compressibility and flow on color Doppler imaging.

Profunda Femoral Vein: No evidence of thrombus. Normal
compressibility and flow on color Doppler imaging.

Femoral Vein: No evidence of thrombus. Normal compressibility,
respiratory phasicity and response to augmentation.

Popliteal Vein: No evidence of thrombus. Normal compressibility,
respiratory phasicity and response to augmentation.

Calf Veins: No evidence of thrombus. Normal compressibility and flow
on color Doppler imaging.

Superficial Great Saphenous Vein: No evidence of thrombus. Normal
compressibility.

Venous Reflux:  None.

Other Findings: No evidence of superficial thrombophlebitis or
abnormal fluid collection.
IMPRESSION: No evidence of right lower extremity deep venous thrombosis.

## 2020-01-01 IMAGING — US US EXTREM LOW VENOUS*R*
1 series · 13 of 24 positions shown · non-contrast
Comparison: None.

CLINICAL DATA: 57-year-old female status post right total knee
replacement on 07/11/2018 with progressive pain and swelling



[Series 1: us extrem low venous*right* · 13 of 35 slices shown]
[im 1/35]
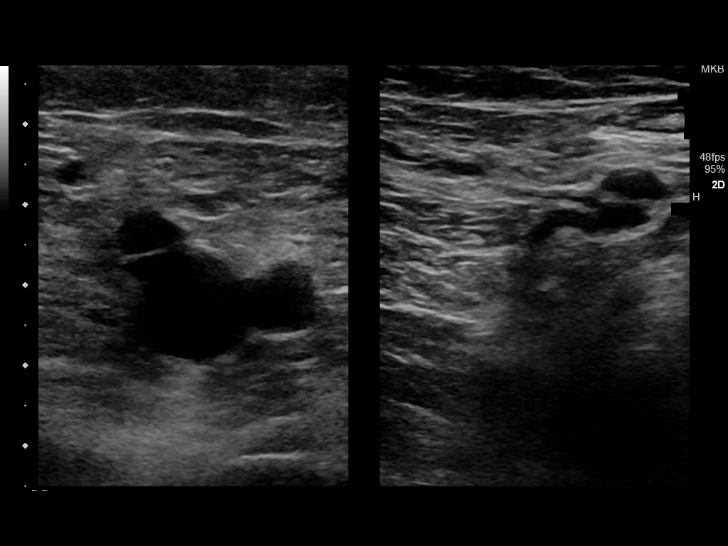
[im 3/35]
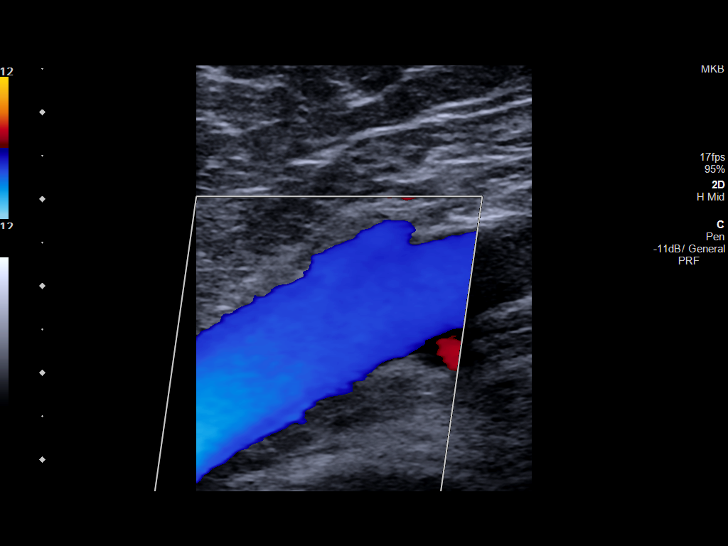
[im 6/35]
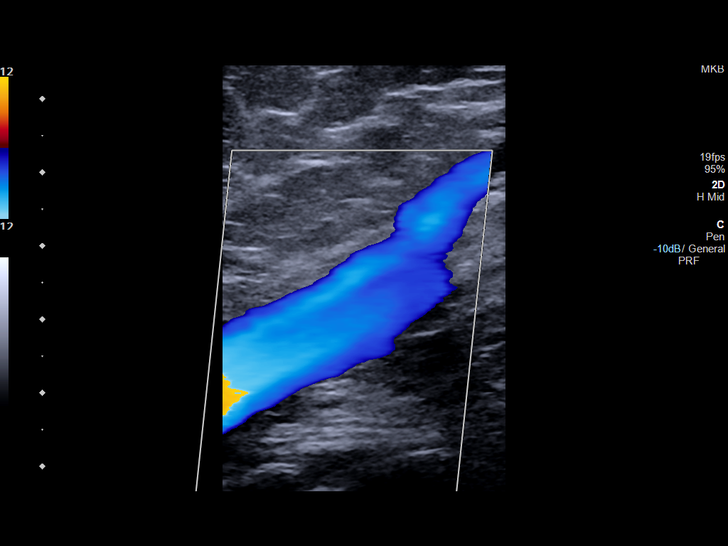
[im 9/35]
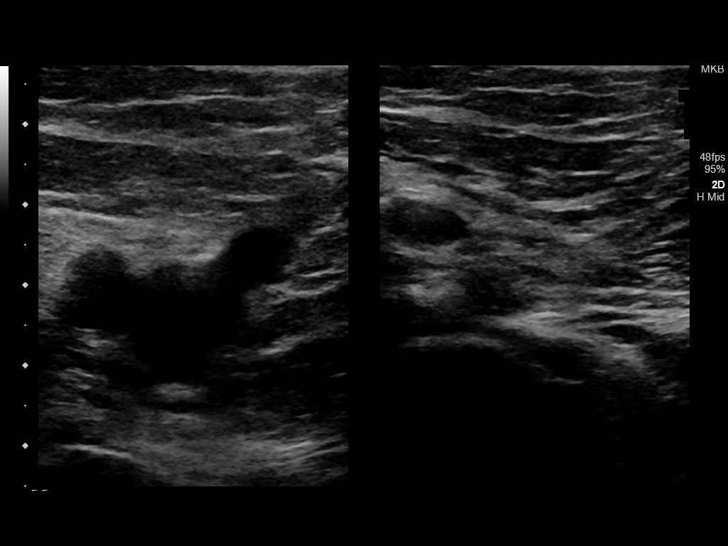
[im 12/35]
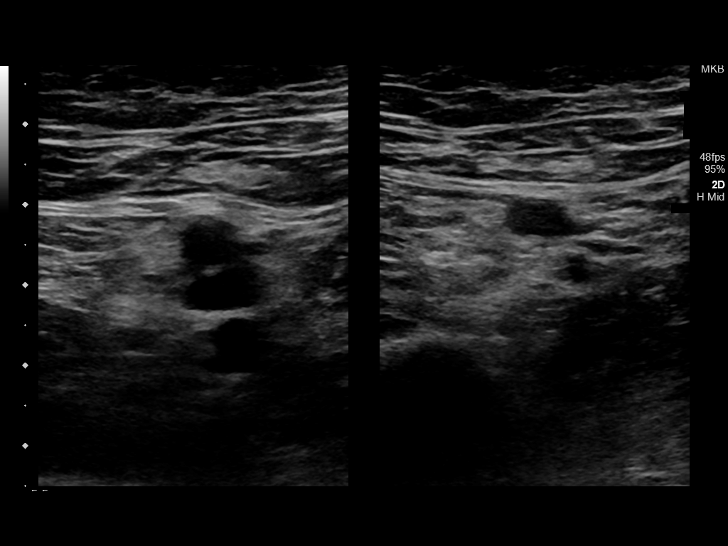
[im 15/35]
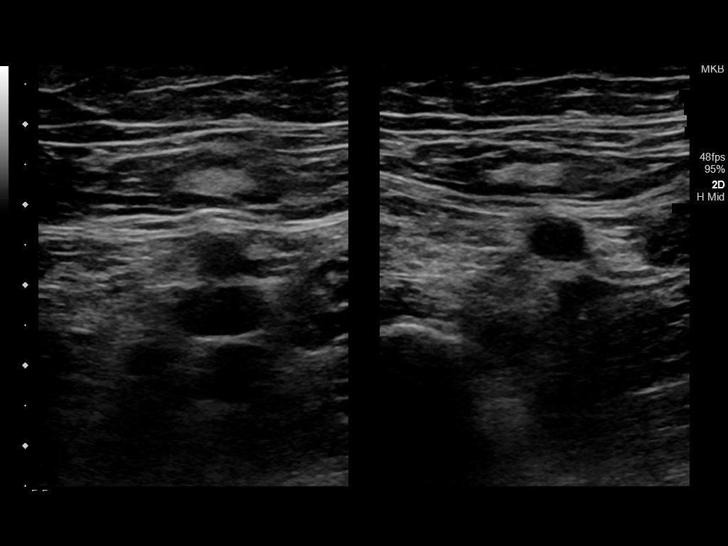
[im 18/35]
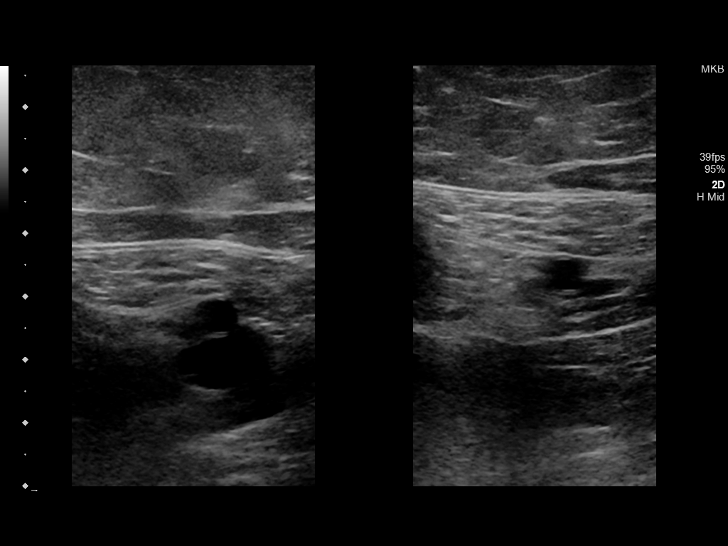
[im 20/35]
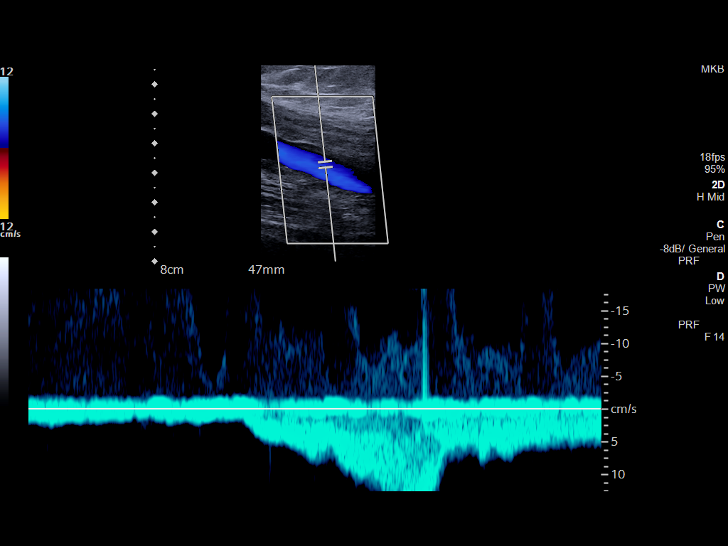
[im 23/35]
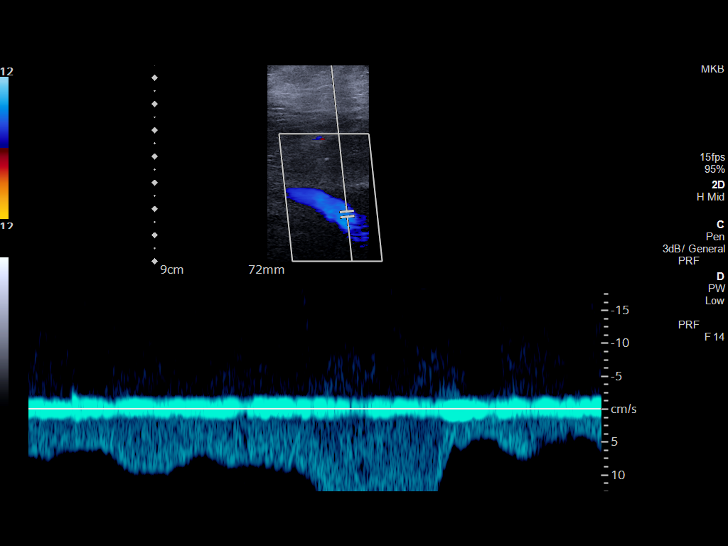
[im 26/35]
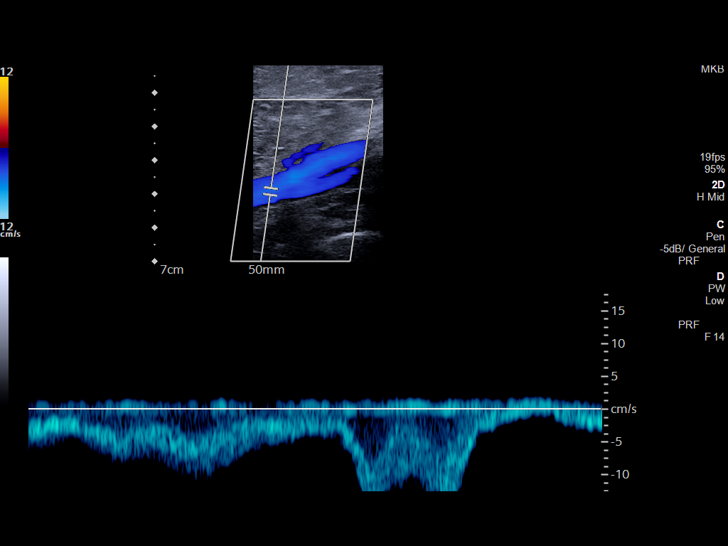
[im 29/35]
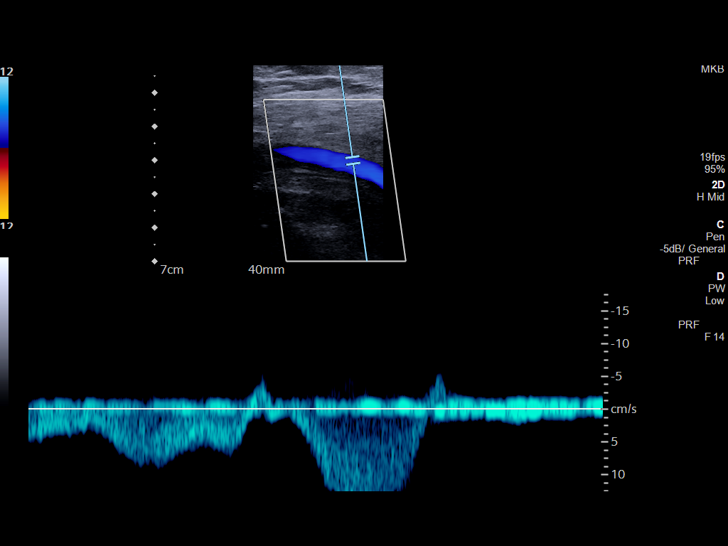
[im 32/35]
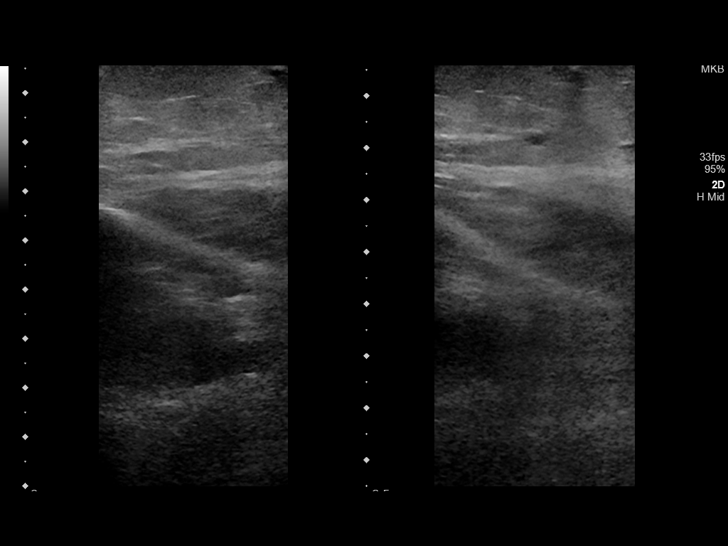
[im 35/35]
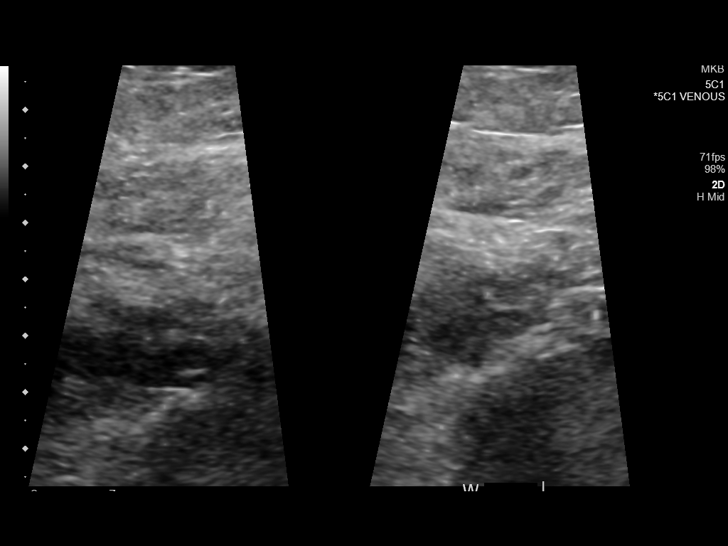

[13 of 24 positions shown; findings below may reference images not displayed]

FINDINGS: Contralateral Common Femoral Vein: Respiratory phasicity is normal
and symmetric with the symptomatic side. No evidence of thrombus.
Normal compressibility.

Common Femoral Vein: No evidence of thrombus. Normal
compressibility, respiratory phasicity and response to augmentation.

Saphenofemoral Junction: No evidence of thrombus. Normal
compressibility and flow on color Doppler imaging.

Profunda Femoral Vein: No evidence of thrombus. Normal
compressibility and flow on color Doppler imaging.

Femoral Vein: No evidence of thrombus. Normal compressibility,
respiratory phasicity and response to augmentation.

Popliteal Vein: No evidence of thrombus. Normal compressibility,
respiratory phasicity and response to augmentation.

Calf Veins: No evidence of thrombus. Normal compressibility and flow
on color Doppler imaging.

Superficial Great Saphenous Vein: No evidence of thrombus. Normal
compressibility.

Venous Reflux:  None.

Other Findings:  None.
IMPRESSION: No evidence of deep venous thrombosis.

## 2020-02-17 ENCOUNTER — Ambulatory Visit: Payer: Medicare Other | Attending: Neurology

## 2020-03-02 IMAGING — CT CT ABD-PELV W/ CM
2 of 5 series · 15 of 46 positions shown, 17 images · IV contrast (APPLIED)
Comparison: CT the abdomen and pelvis 06/11/2018.

CLINICAL DATA: 57-year-old female with pain associated with her
ventral hernia for the past week.

EXAM:
CT ABDOMEN AND PELVIS WITH CONTRAST
TECHNIQUE: Multidetector CT imaging of the abdomen and pelvis was performed
using the standard protocol following bolus administration of
intravenous contrast.
CONTRAST:  30mL 1IWFT4-766 IOPAMIDOL (1IWFT4-766) INJECTION 61%,
100mL 1IWFT4-766 IOPAMIDOL (1IWFT4-766) INJECTION 61%

[Series 2: routine abd/pel with · axial · 0.92mm/px · z∈[-967,-542]mm · 12 of 96 slices shown, 14 images]
[im 6/96  soft-tissue]
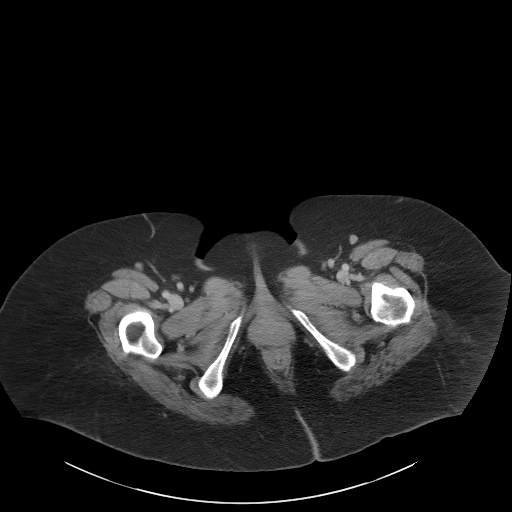
[im 6/96  bone]
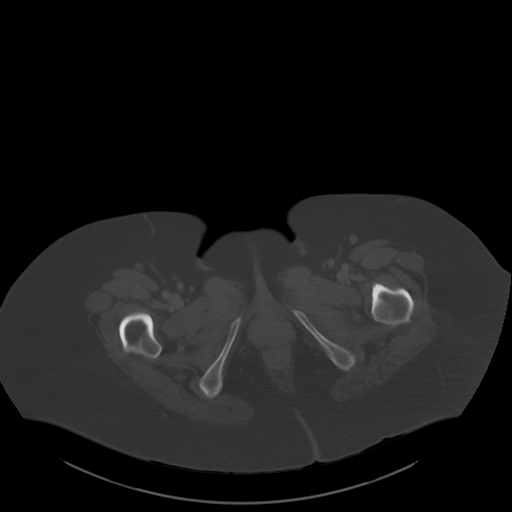
[im 16/96  soft-tissue]
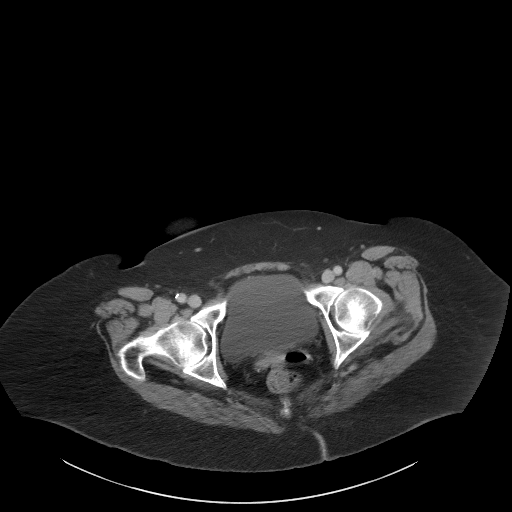
[im 21/96  soft-tissue]
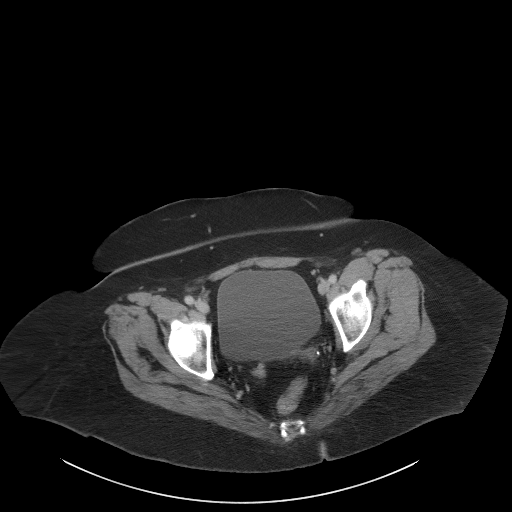
[im 31/96  soft-tissue]
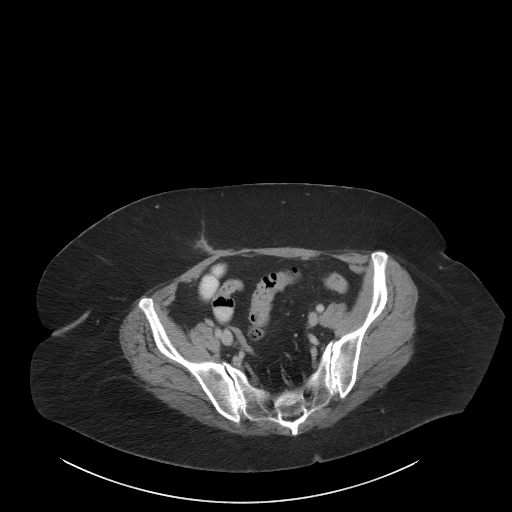
[im 36/96  soft-tissue]
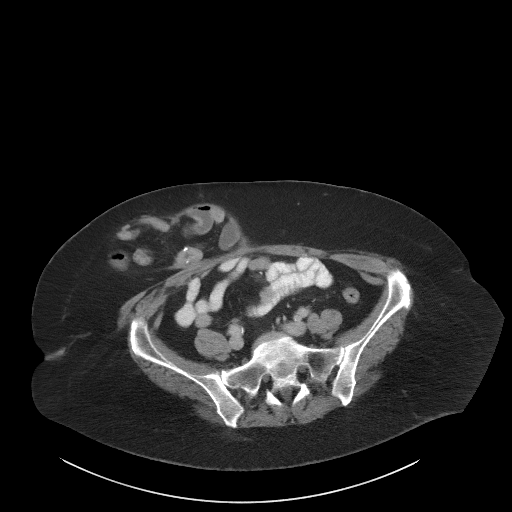
[im 46/96  soft-tissue]
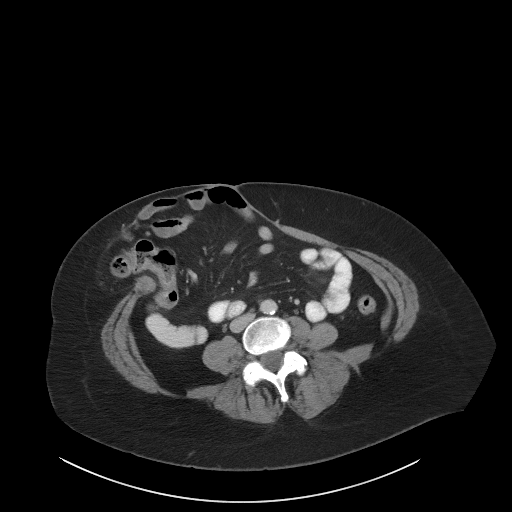
[im 51/96  soft-tissue]
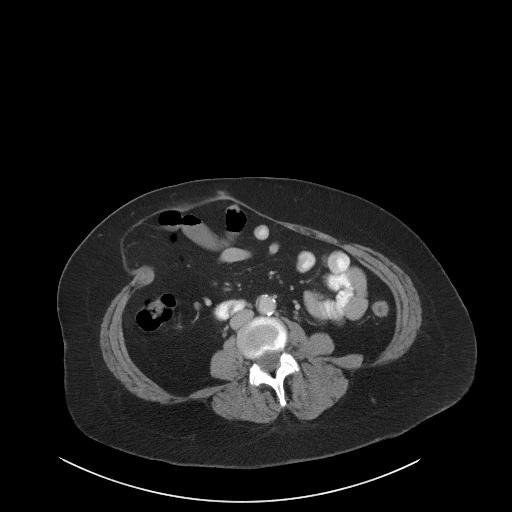
[im 61/96  soft-tissue]
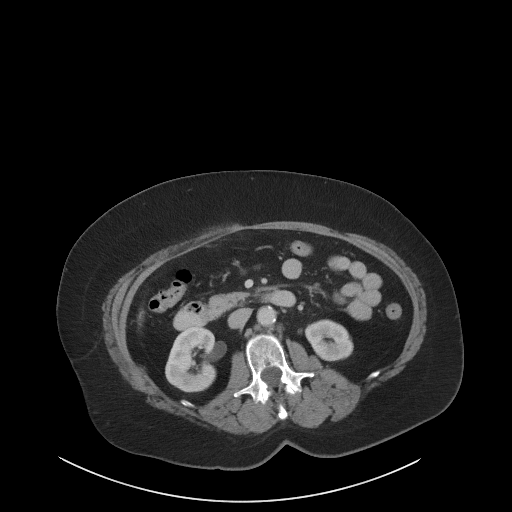
[im 66/96  soft-tissue]
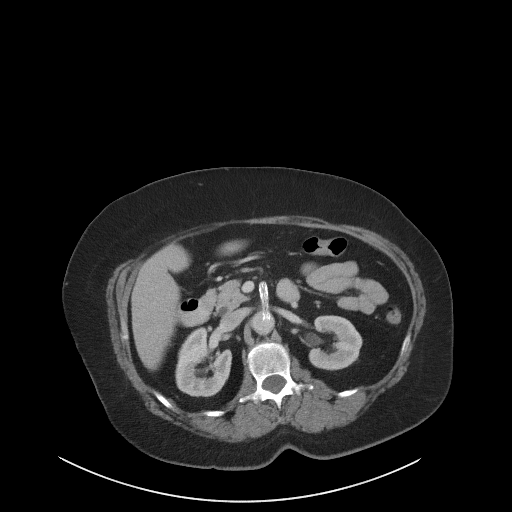
[im 66/96  bone]
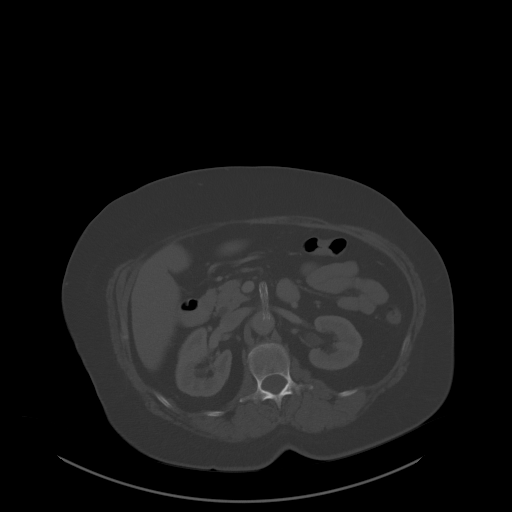
[im 76/96  soft-tissue]
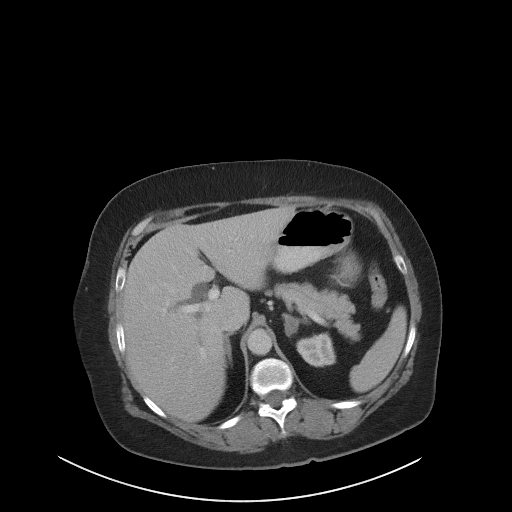
[im 81/96  soft-tissue]
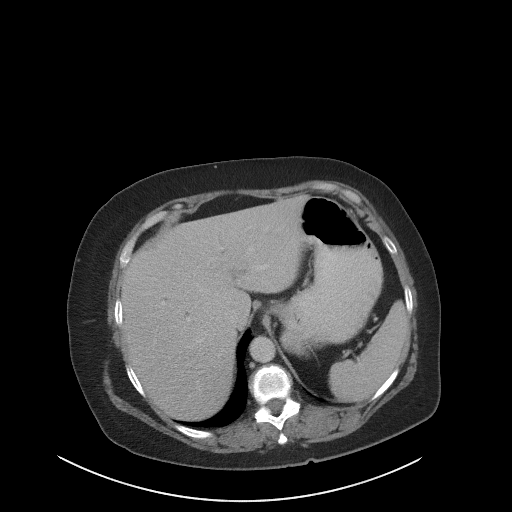
[im 91/96  soft-tissue]
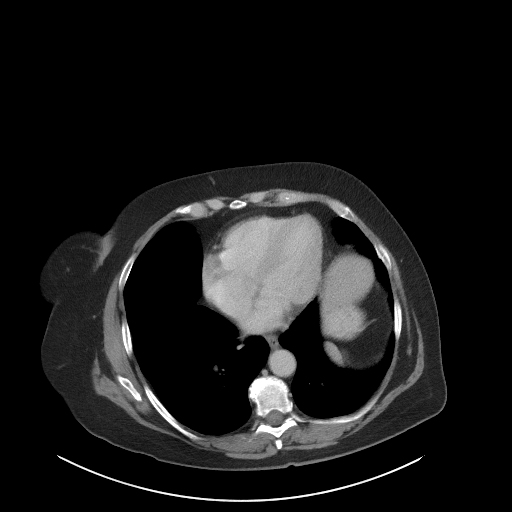

[Series 5: coronal st · coronal · 0.76mm/px · 3 of 92 slices shown]
[im 31/92  soft-tissue]
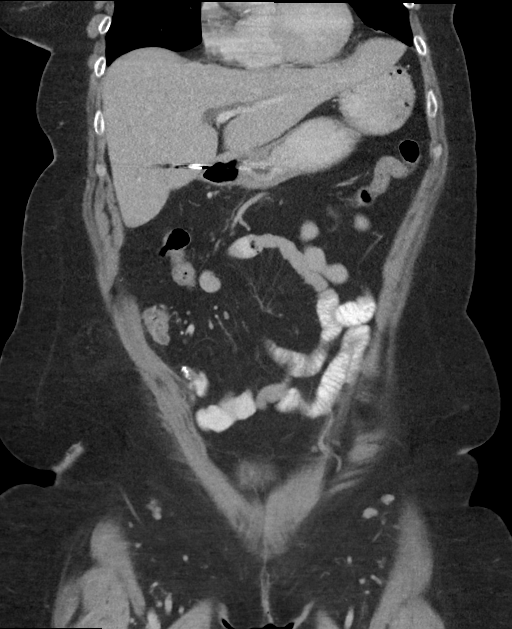
[im 41/92  soft-tissue]
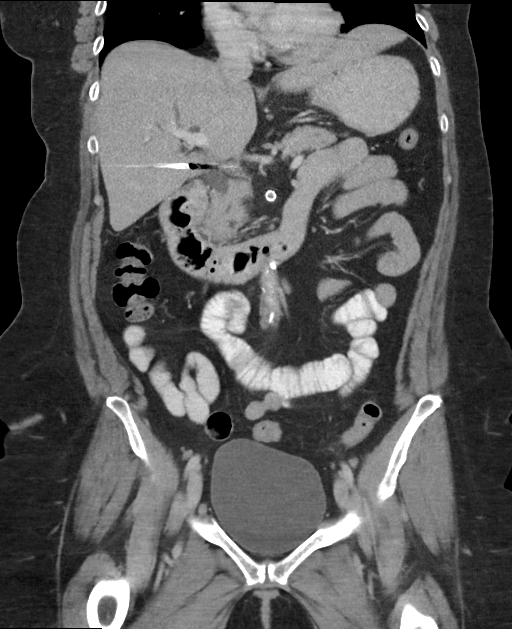
[im 51/92  soft-tissue]
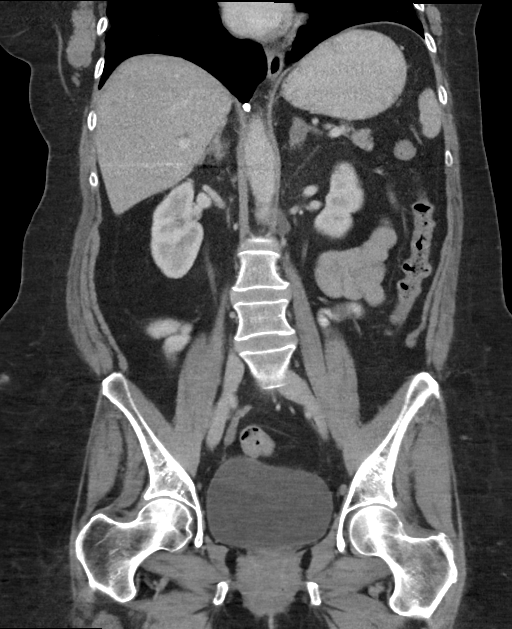

[15 of 46 positions shown; findings below may reference images not displayed]

FINDINGS: Lower chest: Unremarkable.

Hepatobiliary: No suspicious cystic or solid hepatic lesions. No
intra hepatic biliary ductal dilatation. Status post
cholecystectomy. Common bile duct is dilated measuring 15 mm in the
porta hepatis, similar to prior studies, likely reflective of
chronic post cholecystectomy physiology. No calcified
choledocholithiasis identified.

Pancreas: No pancreatic mass. No pancreatic ductal dilatation. No
pancreatic or peripancreatic fluid or inflammatory changes.

Spleen: Unremarkable.

Adrenals/Urinary Tract: 1.6 x 1.9 cm left adrenal nodule, similar to
prior studies, previously characterized as an adenoma. Right adrenal
gland and bilateral kidneys are normal in appearance. No
hydroureteronephrosis. Urinary bladder is normal in appearance.

Stomach/Bowel: Normal appearance of the stomach. No pathologic
dilatation of small bowel or colon. Postoperative changes of partial
small bowel resection in the distal small bowel. Appendix is not
confidently identified may be surgically absent. Several loops of
small bowel and a portion of the ascending colon extend into the
patient's large ventral hernia.

Vascular/Lymphatic: Aortic atherosclerosis, without evidence of
aneurysm or dissection in the abdominal or pelvic vasculature.
Vascular stent in the proximal superior mesenteric artery. No
lymphadenopathy noted in the abdomen or pelvis.

Reproductive: Status post hysterectomy. Ovaries are not confidently
identified may be surgically absent or atrophic.

Other: Large ventral hernia to the right of midline. No significant
volume of ascites. No pneumoperitoneum.

Musculoskeletal: There are no aggressive appearing lytic or blastic
lesions noted in the visualized portions of the skeleton.
IMPRESSION: 1. Large ventral hernia containing several loops of small bowel and
proximal ascending colon, without evidence of bowel incarceration or
obstruction at this time.
2. Aortic atherosclerosis.
3. Left adrenal adenoma is unchanged.
4. Additional incidental findings, as above.

## 2020-03-04 IMAGING — CR DG CHEST 2V
2 series · 2 of 2 positions shown · non-contrast
Comparison: Radiographs 07/08/2018

CLINICAL DATA: Mechanical fall after she got caught on cement. Fell
striking ribs with left-sided pain.

EXAM:
CHEST - 2 VIEW

[chest pa]
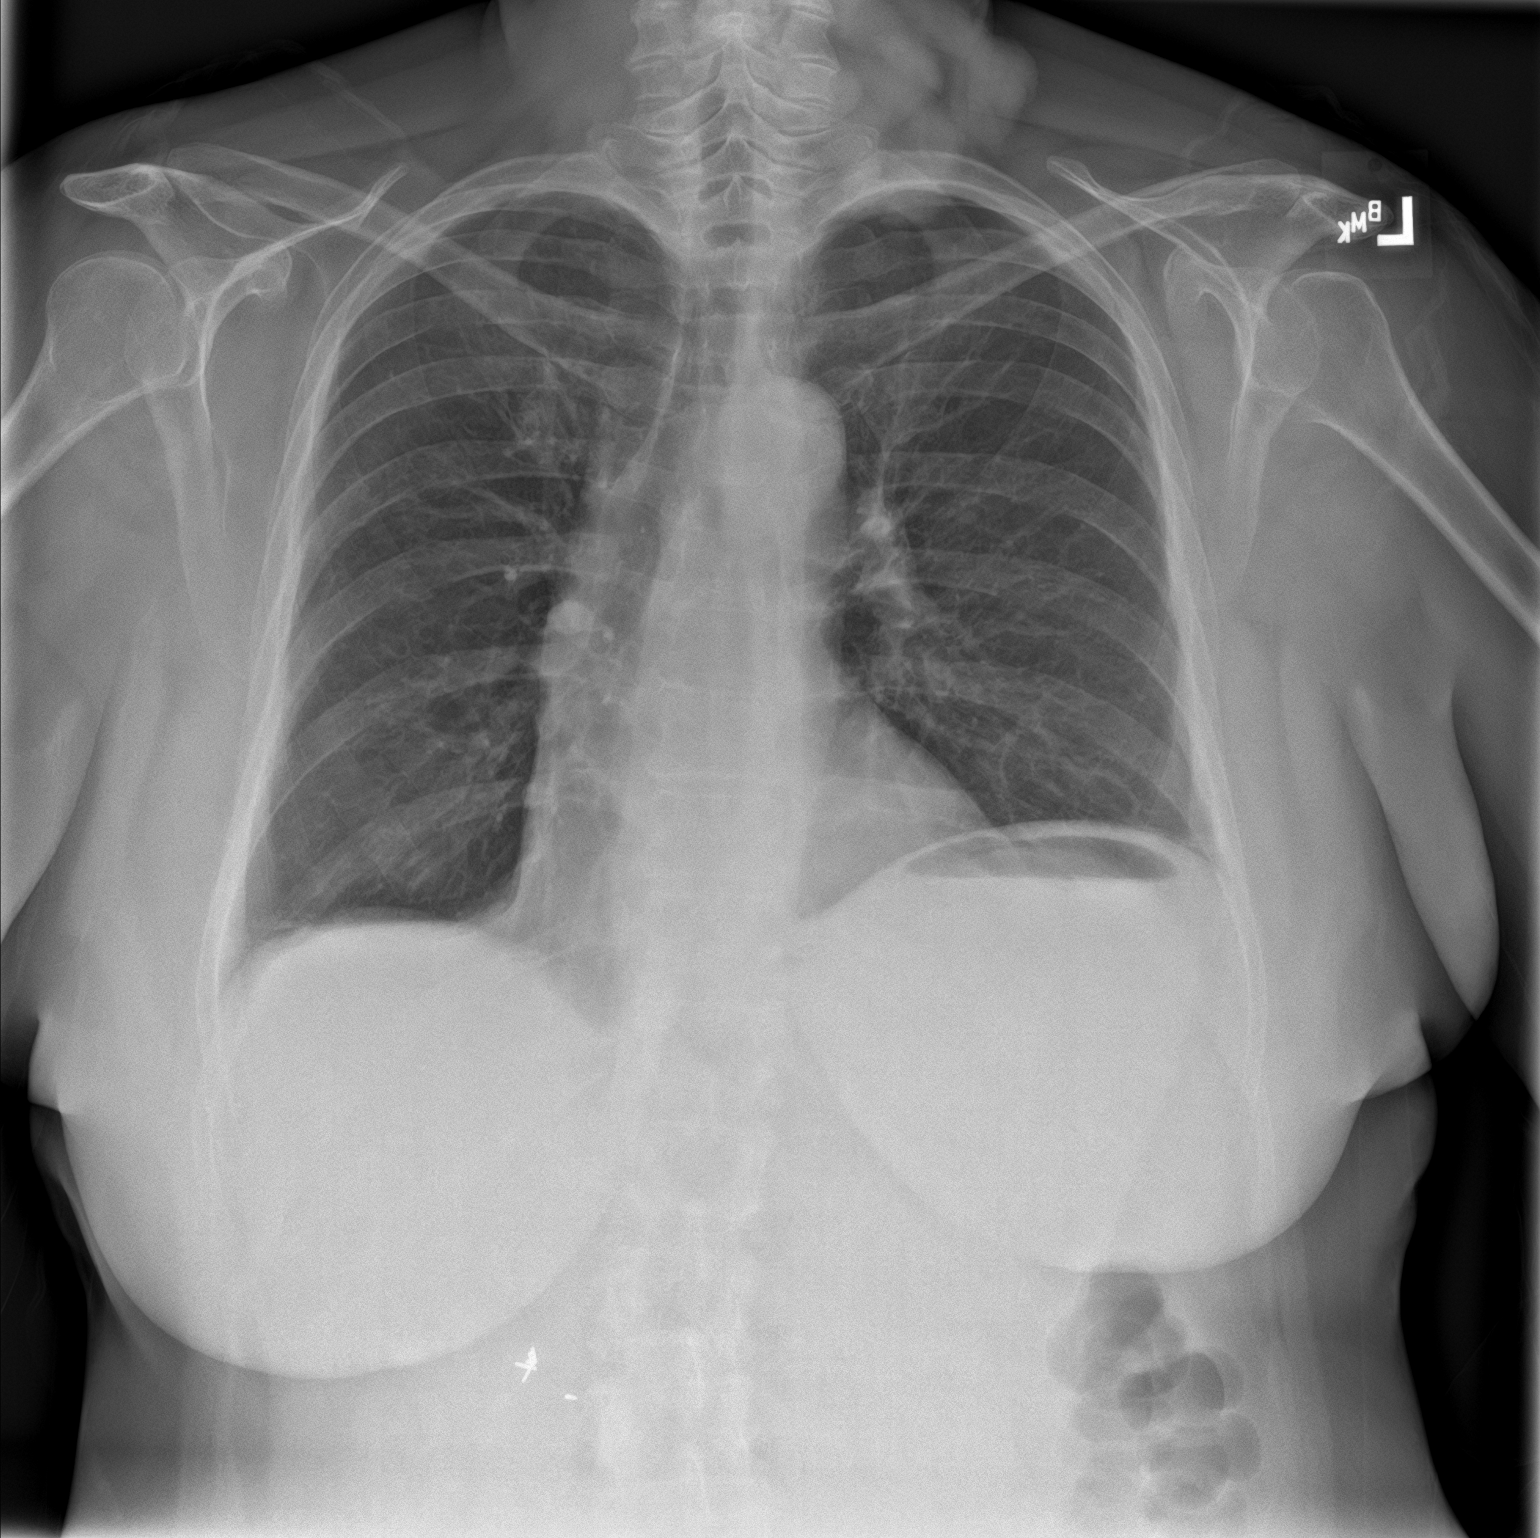

[chest lat]
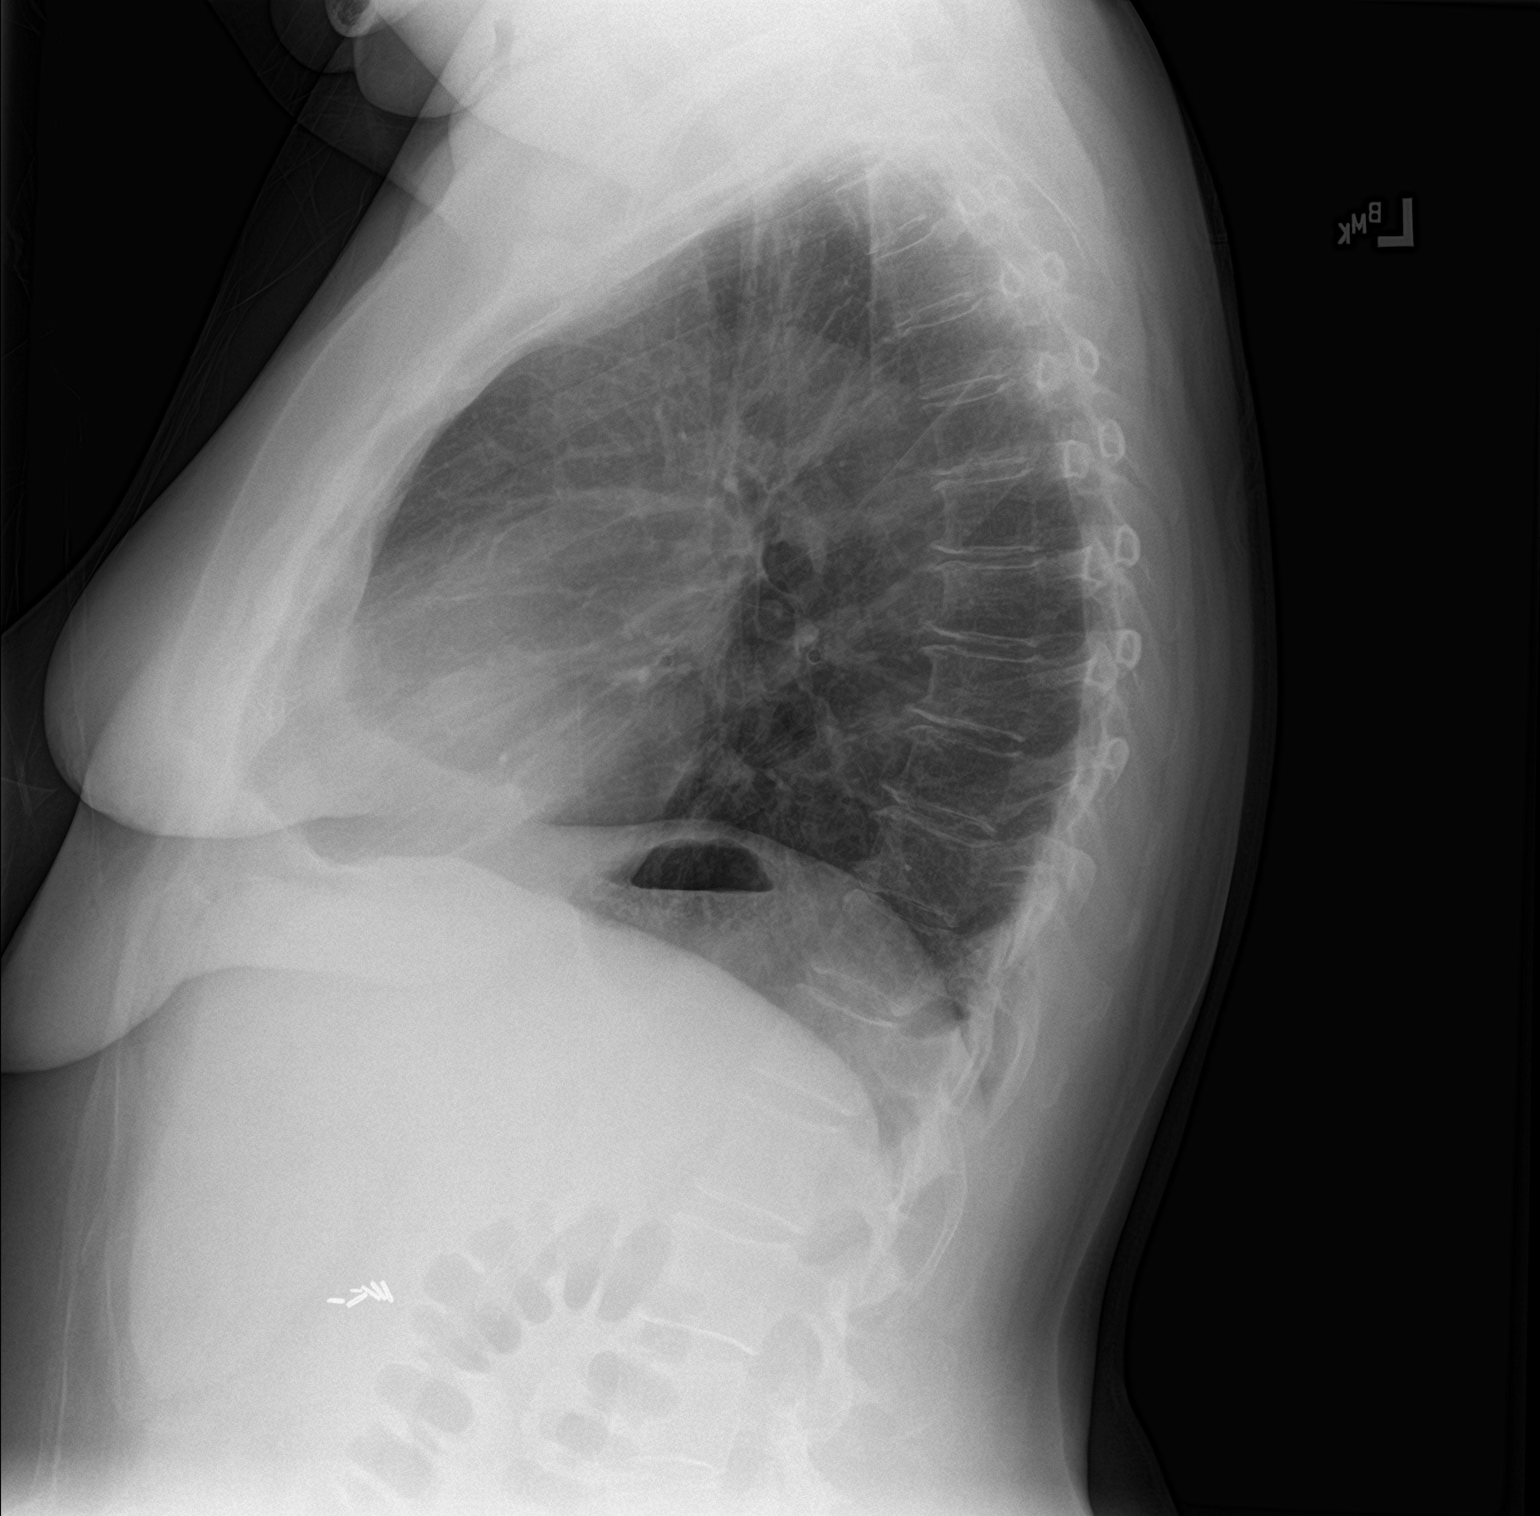

[2 of 2 positions shown; findings below may reference images not displayed]

FINDINGS: The cardiomediastinal contours are normal. Mild streaky bibasilar
atelectasis. Pulmonary vasculature is normal. No consolidation,
pleural effusion, or pneumothorax.

No visualized rib fracture. Mild degenerative change in the spine.
IMPRESSION: 1. Mild streaky bibasilar atelectasis.  No pneumothorax.
2. No visualized rib fracture.

## 2020-03-04 IMAGING — CT CT ABD-PELV W/ CM
2 of 5 series · 16 of 46 positions shown, 18 images · IV contrast (APPLIED)
Comparison: CT 2 days ago 09/17/2018

CLINICAL DATA: Mechanical fall with left chest/rib pain.

EXAM:
CT ABDOMEN AND PELVIS WITH CONTRAST
TECHNIQUE: Multidetector CT imaging of the abdomen and pelvis was performed
using the standard protocol following bolus administration of
intravenous contrast.
CONTRAST:  100mL CLMFD6-B55 IOPAMIDOL (CLMFD6-B55) INJECTION 61%

[Series 2: routine abd/pel with · axial · 0.76mm/px · z∈[-535,-115]mm · 13 of 94 slices shown, 15 images]
[im 5/94  soft-tissue]
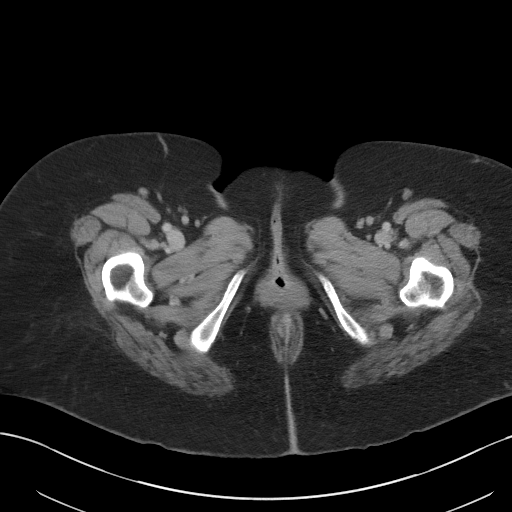
[im 5/94  bone]
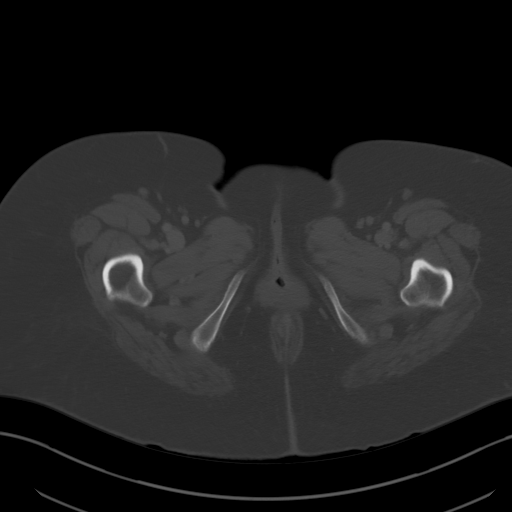
[im 14/94  soft-tissue]
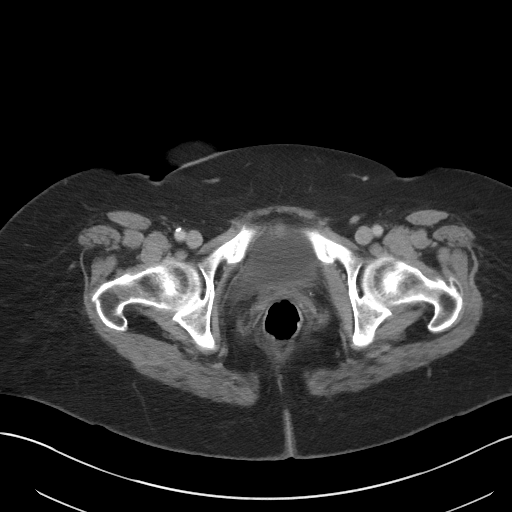
[im 19/94  soft-tissue]
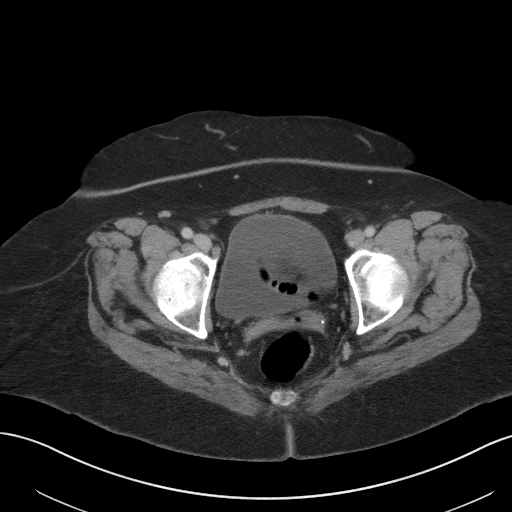
[im 28/94  soft-tissue]
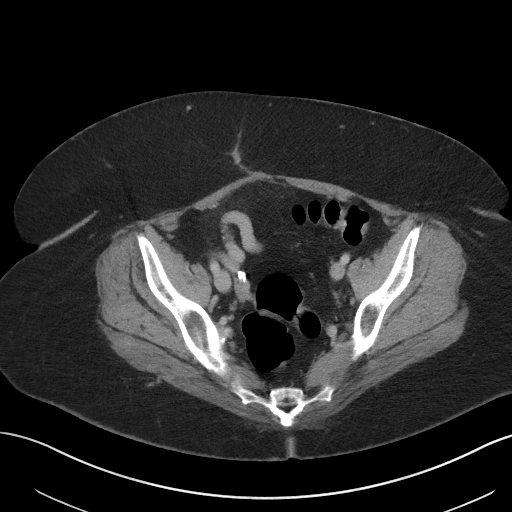
[im 33/94  soft-tissue]
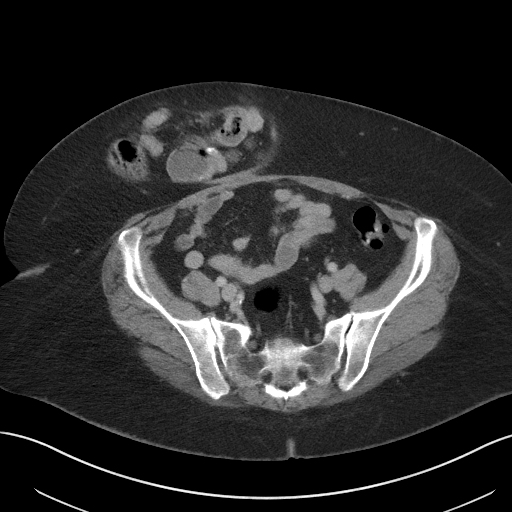
[im 42/94  soft-tissue]
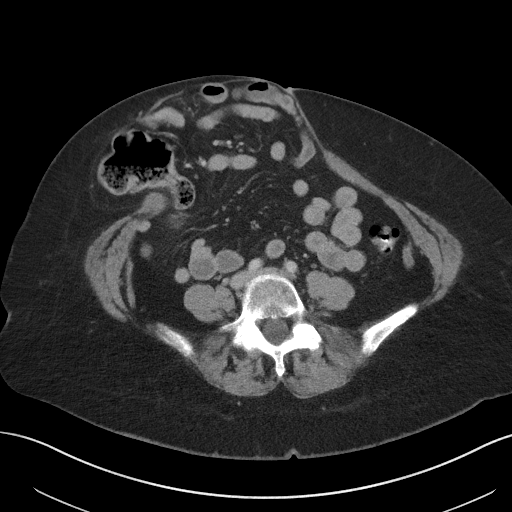
[im 47/94  soft-tissue]
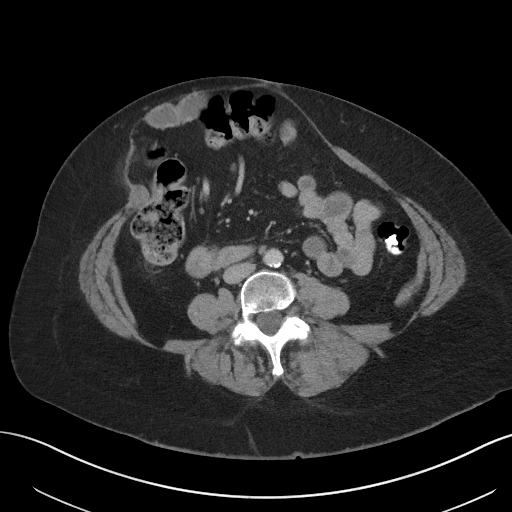
[im 52/94  soft-tissue]
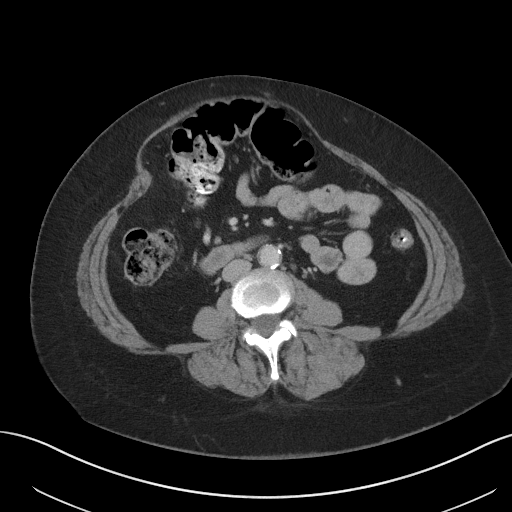
[im 61/94  soft-tissue]
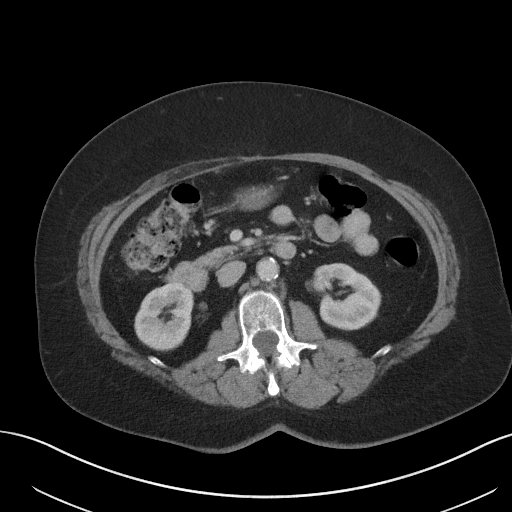
[im 61/94  bone]
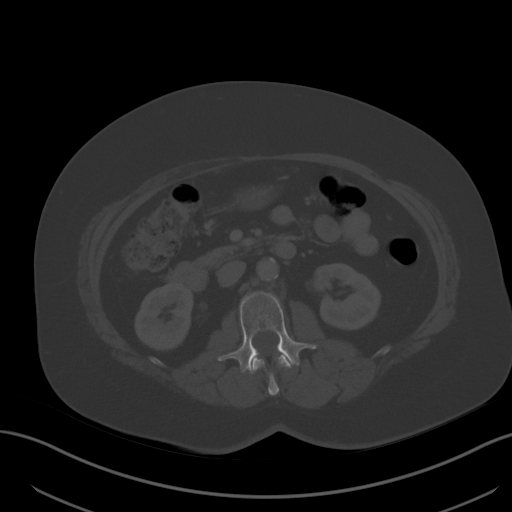
[im 66/94  soft-tissue]
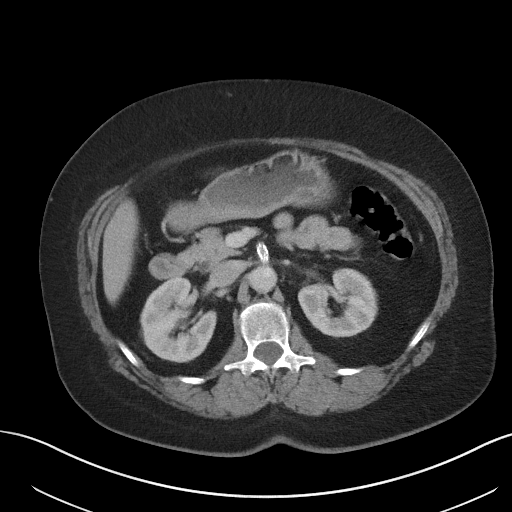
[im 75/94  soft-tissue]
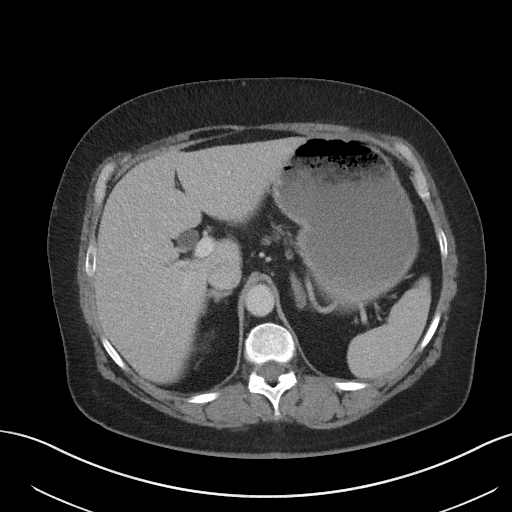
[im 80/94  soft-tissue]
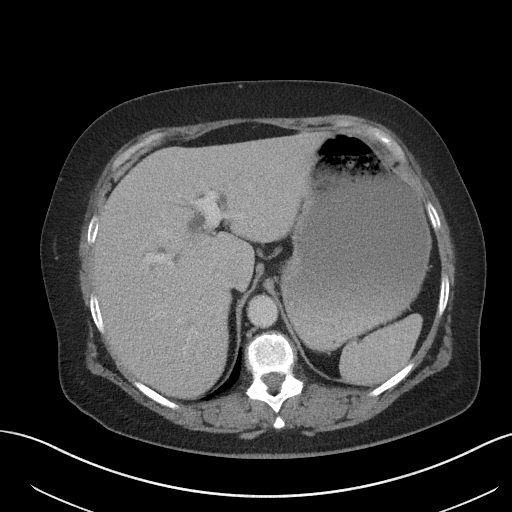
[im 89/94  soft-tissue]
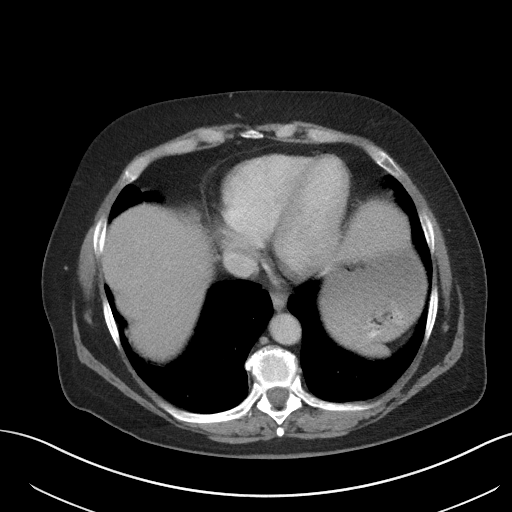

[Series 5: coronal st · coronal · 0.79mm/px · 3 of 103 slices shown]
[im 35/103  soft-tissue]
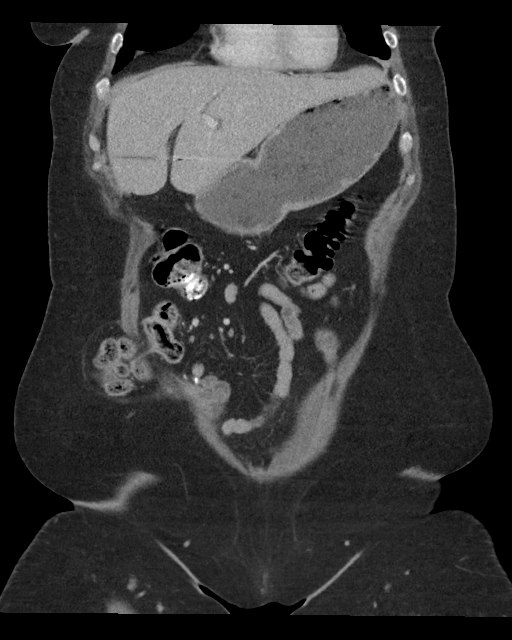
[im 46/103  soft-tissue]
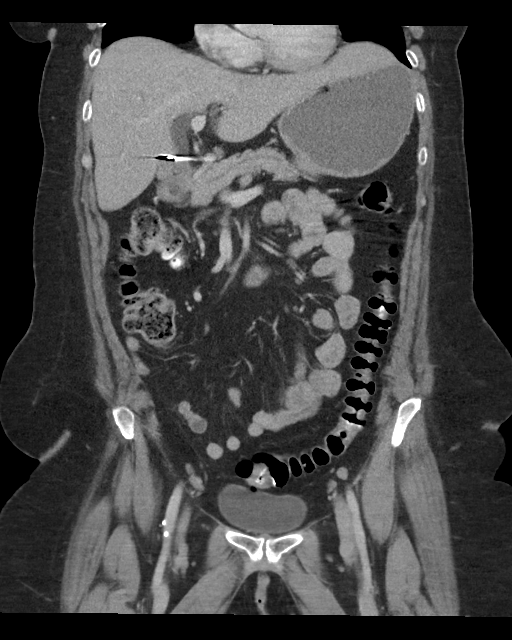
[im 57/103  soft-tissue]
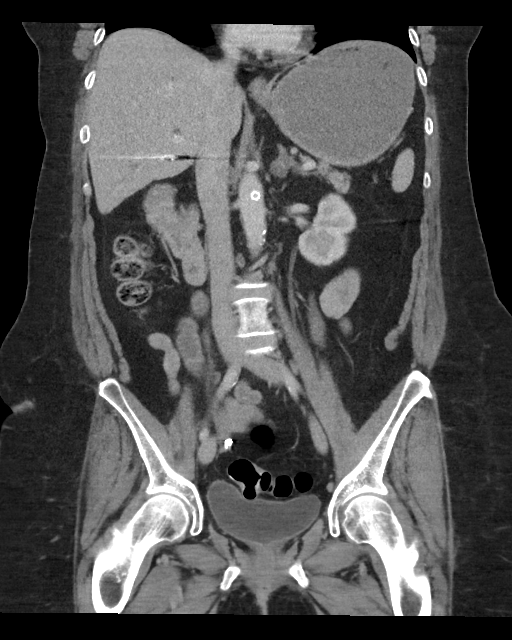

[16 of 46 positions shown; findings below may reference images not displayed]

FINDINGS: Lower chest: No basilar pneumothorax or pleural fluid. Included
lower ribs are intact. Calcified granuloma in the medial right lower
lobe.

Hepatobiliary: No hepatic injury or perihepatic hematoma. Post
cholecystectomy with unchanged intra and extrahepatic biliary ductal
dilatation.

Pancreas: No evidence of injury. No ductal dilatation or
inflammation.

Spleen: No splenic injury or perisplenic hematoma. Splenic cleft
anteriorly as before.

Adrenals/Urinary Tract: Left adrenal adenoma. No adrenal hemorrhage.
No evidence of renal injury. Homogeneous renal enhancement. No
perinephric edema. No bladder injury, bladder physiologically
distended without wall thickening.

Stomach/Bowel: The stomach is distended without gastric wall
thickening. No evidence of bowel injury or mesenteric hematoma. No
bowel wall thickening or inflammatory change. Large ventral
abdominal wall hernia containing nonobstructed noninflamed loops of
colon and small bowel. Enteric suture within small bowel in the
ventral hernia.

Vascular/Lymphatic: No vascular injury. The abdominal aorta and IVC
are intact. No retroperitoneal fluid. Aortic atherosclerosis without
aneurysm. SMA stent as before. No adenopathy.

Reproductive: Status post hysterectomy. No adnexal masses.

Other: Ventral abdominal wall hernia containing large and small
bowel as on prior exam. No free air or free fluid. No confluent body
wall contusion.

Musculoskeletal: No fracture of the pelvis, lumbar and included
lower thoracic spine, or included ribs.
IMPRESSION: 1. No evidence of acute traumatic injury to the abdomen or pelvis.
2. Large ventral abdominal wall hernia containing nonobstructed
noninflamed loops of colon and small bowel.
3.  Aortic Atherosclerosis (J09EQ-[id number]).

## 2020-03-29 ENCOUNTER — Other Ambulatory Visit (INDEPENDENT_AMBULATORY_CARE_PROVIDER_SITE_OTHER): Payer: Self-pay | Admitting: Nurse Practitioner

## 2020-03-29 DIAGNOSIS — I739 Peripheral vascular disease, unspecified: Secondary | ICD-10-CM

## 2020-03-30 ENCOUNTER — Ambulatory Visit (INDEPENDENT_AMBULATORY_CARE_PROVIDER_SITE_OTHER): Payer: Medicare Other | Admitting: Vascular Surgery

## 2020-03-30 ENCOUNTER — Ambulatory Visit (INDEPENDENT_AMBULATORY_CARE_PROVIDER_SITE_OTHER): Payer: Medicare Other

## 2020-03-30 ENCOUNTER — Other Ambulatory Visit: Payer: Self-pay

## 2020-03-30 ENCOUNTER — Encounter (INDEPENDENT_AMBULATORY_CARE_PROVIDER_SITE_OTHER): Payer: Self-pay | Admitting: Vascular Surgery

## 2020-03-30 VITALS — BP 121/84 | HR 65 | Resp 16 | Wt 246.6 lb

## 2020-03-30 DIAGNOSIS — I739 Peripheral vascular disease, unspecified: Secondary | ICD-10-CM

## 2020-03-30 DIAGNOSIS — I70222 Atherosclerosis of native arteries of extremities with rest pain, left leg: Secondary | ICD-10-CM | POA: Diagnosis not present

## 2020-03-30 DIAGNOSIS — I10 Essential (primary) hypertension: Secondary | ICD-10-CM

## 2020-03-30 DIAGNOSIS — I480 Paroxysmal atrial fibrillation: Secondary | ICD-10-CM

## 2020-03-30 DIAGNOSIS — I998 Other disorder of circulatory system: Secondary | ICD-10-CM

## 2020-03-30 DIAGNOSIS — K436 Other and unspecified ventral hernia with obstruction, without gangrene: Secondary | ICD-10-CM

## 2020-03-30 DIAGNOSIS — E785 Hyperlipidemia, unspecified: Secondary | ICD-10-CM

## 2020-03-30 NOTE — Progress Notes (Signed)
MRN : [id number]  Carla [name] is a 59 y.o. (Jul 27, 1961) female who presents with chief complaint of  Chief Complaint  Patient presents with  . Follow-up    ultrasound follow up  .  History of Present Illness: Patient returns today in follow up of her PAD. She has healed her third toe amputation site.  She still has some numbness and burning in her left forefoot, but otherwise her feet and legs are doing well.  Her ABIs today are in the normal range and her digital pressures are in the 90s bilaterally.  Current Outpatient Medications  Medication Sig Dispense Refill  . acetaminophen (TYLENOL) 500 MG tablet Take 1,000 mg by mouth every 6 (six) hours as needed for moderate pain or headache.    . albuterol (VENTOLIN HFA) 108 (90 Base) MCG/ACT inhaler Inhale 2 puffs into the lungs every 6 (six) hours as needed for wheezing or shortness of breath.     [name] amLODipine (NORVASC) 5 MG tablet Take 1 tablet (5 mg total) by mouth daily. 30 tablet 2  . apixaban (ELIQUIS) 5 MG TABS tablet Take 5 mg by mouth 2 (two) times daily.    [name] atorvastatin (LIPITOR) 80 MG tablet Take 80 mg by mouth at bedtime.     [name] buPROPion (WELLBUTRIN XL) 150 MG 24 hr tablet Take 150 mg by mouth daily.     . DULoxetine (CYMBALTA) 60 MG capsule Take 60 mg by mouth daily.    . ferrous sulfate 325 (65 FE) MG tablet Take 325 mg by mouth daily.    [name] gabapentin (NEURONTIN) 100 MG capsule Take 1 capsule (100 mg total) by mouth 2 (two) times daily. 180 capsule 3  . metoprolol tartrate (LOPRESSOR) 50 MG tablet Take 1 tablet (50 mg total) by mouth 2 (two) times daily. 30 tablet 0  . Multiple Vitamin (MULTIVITAMIN WITH MINERALS) TABS tablet Take 1 tablet by mouth daily. 90 tablet 3  . omeprazole (PRILOSEC) 20 MG capsule Take 20 mg by mouth 2 (two) times daily before a meal.    . ondansetron (ZOFRAN) 4 MG tablet TAKE 1 TABLET BY MOUTH EVERY 8 HOURS AS NEEDED FOR UP TO 7 DAYS FOR NAUSEA OR VOMITING    . topiramate (TOPAMAX) 50 MG tablet  Take by mouth.    . valACYclovir (VALTREX) 1000 MG tablet Take by mouth.    [name] aspirin EC 81 MG EC tablet Take 1 tablet (81 mg total) by mouth daily. (Patient not taking: Reported on 03/30/2020) 90 tablet 3  . clotrimazole-betamethasone (LOTRISONE) cream Apply 1 application topically 2 (two) times daily as needed (for dry skin).     [name] HYDROcodone-acetaminophen (NORCO/VICODIN) 5-325 MG tablet Take 1 tablet by mouth every 6 (six) hours as needed for moderate pain.    [name] lidocaine (LIDODERM) 5 % Place 1 patch onto the skin daily. Remove & Discard patch within 12 hours or as directed by MD (Patient not taking: Reported on 03/30/2020) 30 patch 0  . methylPREDNISolone (MEDROL DOSEPAK) 4 MG TBPK tablet Take 4-24 mg by mouth See admin instructions. (Typical regimens for 21 tablet dose packs of Methylprednisolone 4mg , Prednisone 5mg , and Prednisone 10mg ) Day 1: 2 tabs before breakfast, 1 tab after lunch, 1 tab after supper, and 2 tabs at bedtime. Day 2: 1 tab before breakfast, 1 tab after lunch, 1 tab after supper, and 2 tabs at bedtime. Day 3: 1 tab before breakfast, 1 tab after lunch, 1 tab after supper, and 1 tab at  bedtime. Day 4: 1 tab before breakfast, 1 tab after lunch, and 1 tab at bedtime. Day 5: 1 tab before breakfast and 1 tab at bedtime. Day 6: 1 tab before breakfast.    . oxyCODONE-acetaminophen (PERCOCET/ROXICET) 5-325 MG tablet Take by mouth.    . topiramate (TOPAMAX) 25 MG tablet Take 25 mg by mouth 2 (two) times daily.      No current facility-administered medications for this visit.    Past Medical History:  Diagnosis Date  . Abdominal aortic atherosclerosis ([place])    a. 05/2017 CTA abd/pelvis: significant atherosclerotic dzs of the infrarenal abd Ao w/ some mural thrombus. No aneurysm or dissection.  . Abdominal pain   . Acute focal ischemia of small intestine ([facility])   . Acute right-sided low back pain with right-sided sciatica 06/13/2017  . AKI (acute kidney injury) ([employer]) 07/29/2017  .  Baker's cyst of knee, right    a. 07/2016 U/S: 4.1 x 1.4 x 2.9 cystic structure in R poplitetal fossa.  . Bell's palsy   . Borderline diabetes   . Cancer ([place])    skin cancer on nose  . Chest pain    a. 08/2012 Lexiscan MV: EF 54%, non ischemia/infarct.  . CHF (congestive heart failure) ([place])   . Chronic idiopathic constipation   . Chronic mesenteric ischemia ([place])   . COPD (chronic obstructive pulmonary disease) ([place])   . Diastolic dysfunction    a. 05/2017 Echo: Ef 60-65%, no rwma, Gr1 DD, no source of cardiac emboli.  . 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 05/2017 CTA Abd/pelvis: apparent thrombus or embolus in prox SMA (70-90%); b. 05/2017 catheter directed tPA, mechanical thrombectomy, and stenting of the SMA.  . Essential hypertension with goal blood pressure less than 140/90 01/19/2016  . Gastroesophageal reflux disease 01/19/2016  . GERD (gastroesophageal reflux disease)   . History of kidney stones   . Hyperlipidemia   . Hypertension   . Hypotension 07/29/2017  . Morbid obesity with BMI of 40.0-44.9, adult ([place])   . Occlusive mesenteric ischemia ([place]) 06/19/2017  . Personal history of other malignant neoplasm of skin 06/01/2016  . Pre-diabetes   . Primary osteoarthritis of both knees 06/13/2017  . Recurrent major depressive disorder ([place]) 01/19/2016  . SBO (small bowel obstruction) ([place]) 08/06/2017  . 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 Urinary tract infection 01/09/2014    Past Surgical History:  Procedure Laterality Date  . AMPUTATION TOE Left 12/09/2019   Procedure: AMPUTATION TOE MPJ left;  Surgeon: Caroline More, DPM;  Location: [facility] ORS;  Service: Podiatry;  Laterality: Left;  . APPENDECTOMY    . CHOLECYSTECTOMY    . LAPAROTOMY N/A 08/08/2017   Procedure: EXPLORATORY LAPAROTOMY POSSIBLE BOWEL RESECTION;  Surgeon: Jules Husbands, MD;  Location: [facility] ORS;  Service: General;  Laterality: N/A;  . LOWER EXTREMITY ANGIOGRAPHY Left 11/17/2019    Procedure: LOWER EXTREMITY ANGIOGRAPHY;  Surgeon: Algernon Huxley, MD;  Location: [place] [facility];  Service: Cardiovascular;  Laterality: Left;  . LOWER EXTREMITY INTERVENTION N/A 11/19/2019   Procedure: LOWER EXTREMITY INTERVENTION;  Surgeon: Algernon Huxley, MD;  Location: [place] [facility];  Service: Cardiovascular;  Laterality: N/A;  . TEE WITHOUT CARDIOVERSION N/A 06/22/2017   Procedure: TRANSESOPHAGEAL ECHOCARDIOGRAM (TEE);  Surgeon: Wellington Hampshire, MD;  Location: [facility] OR[facility];  Service: Cardiovascular;  Laterality: N/A;  . TOTAL KNEE ARTHROPLASTY Right 07/11/2018   Procedure: TOTAL KNEE ARTHROPLASTY;  Surgeon: Corky Mull, MD;  Location: [facility]  ORS;  Service: Orthopedics;  Laterality: Right;  [name] VAGINAL HYSTERECTOMY    . VISCERAL ARTERY INTERVENTION N/A 06/20/2017   Procedure: Visceral Artery Intervention, possible aortic thrombectomy;  Surgeon: Algernon Huxley, MD;  Location: [place] [facility];  Service: Cardiovascular;  Laterality: N/A;  . VISCERAL ARTERY INTERVENTION N/A 01/28/2018   Procedure: VISCERAL ARTERY INTERVENTION;  Surgeon: Algernon Huxley, MD;  Location: [place] [facility];  Service: Cardiovascular;  Laterality: N/A;     Social History   Tobacco Use  . Smoking status: Current Every Day Smoker    Packs/day: 0.50    Types: Cigarettes  . Smokeless tobacco: Never Used  Substance Use Topics  . Alcohol use: No  . Drug use: No      Family History  Problem Relation Age of Onset  . Hypertension Mother   . Heart disease Mother   . Heart attack Mother   . Breast cancer Mother   . Hypertension Father   . Breast cancer Paternal Aunt     Allergies  Allergen Reactions  . Gabapentin     Tremors   . Bactrim [Sulfamethoxazole-Trimethoprim] Itching    REVIEW OF SYSTEMS (Negative unless checked)  Constitutional: [] ?Weight loss  [] ?Fever  [] ?Chills Cardiac: [] ?Chest pain   [] ?Chest pressure   [] ?Palpitations   [] ?Shortness of breath when laying flat   [] ?Shortness of breath  at rest   [] ?Shortness of breath with exertion. Vascular:  [] ?Pain in legs with walking   [] ?Pain in legs at rest   [] ?Pain in legs when laying flat   [] ?Claudication   [x] ?Pain in feet when walking  [x] ?Pain in feet at rest  [x] ?Pain in feet when laying flat   [] ?History of DVT   [] ?Phlebitis   [] ?Swelling in legs   [] ?Varicose veins   [] ?Non-healing ulcers Pulmonary:   [] ?Uses home oxygen   [] ?Productive cough   [] ?Hemoptysis   [] ?Wheeze  [] ?COPD   [] ?Asthma Neurologic:  [] ?Dizziness  [] ?Blackouts   [] ?Seizures   [] ?History of stroke   [] ?History of TIA  [] ?Aphasia   [] ?Temporary blindness   [] ?Dysphagia   [] ?Weakness or numbness in arms   [] ?Weakness or numbness in legs Musculoskeletal:  [] ?Arthritis   [] ?Joint swelling   [] ?Joint pain   [] ?Low back pain Hematologic:  [] ?Easy bruising  [] ?Easy bleeding   [] ?Hypercoagulable state   [] ?Anemic   Gastrointestinal:  [] ?Blood in stool   [] ?Vomiting blood  [] ?Gastroesophageal reflux/heartburn   [x] ?Abdominal pain Genitourinary:  [] ?Chronic kidney disease   [] ?Difficult urination  [] ?Frequent urination  [] ?Burning with urination   [] ?Hematuria Skin:  [] ?Rashes   [] ?Ulcers   [] ?Wounds Psychological:  [] ?History of anxiety   [] ? History of major depression.  Physical Examination  BP 121/84 (BP Location: Right Arm)   Pulse 65   Resp 16   Wt 246 lb 9.6 oz (111.9 kg)   BMI 45.10 kg/m  Gen:  WD/WN, NAD Head: [place]/AT, No temporalis wasting. Ear/Nose/Throat: Hearing grossly intact, nares w/o erythema or drainage Eyes: Conjunctiva clear. Sclera non-icteric Neck: Supple.  Trachea midline Pulmonary:  Good air movement, no use of accessory muscles.  Cardiac: RRR, no JVD Vascular:  Vessel Right Left  Radial Palpable Palpable                          PT Palpable Palpable  DP Palpable Palpable   Gastrointestinal: soft.  Large ventral hernia predominantly on the right side. Musculoskeletal: M/S 5/5 throughout.  No deformity or atrophy.  Left third  toe amputation site has healed well.  Trace lower extremity edema. Neurologic: Sensation grossly intact in extremities.  Symmetrical.  Speech is fluent.  Psychiatric: Judgment intact, Mood & affect appropriate for pt's clinical situation. Dermatologic: No rashes or ulcers noted.  No cellulitis or open wounds.       Labs No results found for this or any previous visit (from the past 2160 hour(s)).  Radiology No results found.  Assessment/Plan Paroxysmal atrial fibrillation ([place]) She remains on anticoagulation and should stay on this indefinitely.  Essential hypertension with goal blood pressure less than 140/90 blood pressure control important in reducing the progression of atherosclerotic disease. On appropriate oral medications.   Ventral hernia with obstruction and without gangrene Seen on CT scan while in the hospital as well.  Has been referred to general surgery for consideration for repair in the future.  Has been told she needs to lose weight before doing this.  Hyperlipidemia lipid control important in reducing the progression of atherosclerotic disease. Continue statin therapy  Ischemia of left lower extremity Her ABIs today are in the normal range and her digital pressures are in the 90s bilaterally.  Doing well after revascularization.  Digital amputation site is healed.  We can stretch out her follow-up to 6 months.  She will need to remain on indefinite anticoagulation due to her multiple previous embolic events.    [name], MD  03/30/2020 12:15 PM    This note was created with Dragon medical transcription system.  Any errors from dictation are purely unintentional

## 2020-03-30 NOTE — Assessment & Plan Note (Signed)
Her ABIs today are in the normal range and her digital pressures are in the 90s bilaterally.  Doing well after revascularization.  Digital amputation site is healed.  We can stretch out her follow-up to 6 months.  She will need to remain on indefinite anticoagulation due to her multiple previous embolic events.

## 2020-04-20 ENCOUNTER — Other Ambulatory Visit: Payer: Self-pay | Admitting: Physical Medicine and Rehabilitation

## 2020-04-20 ENCOUNTER — Other Ambulatory Visit (HOSPITAL_COMMUNITY): Payer: Self-pay | Admitting: Physical Medicine and Rehabilitation

## 2020-04-20 DIAGNOSIS — M5416 Radiculopathy, lumbar region: Secondary | ICD-10-CM

## 2020-04-25 IMAGING — CT CT ABD-PELV W/ CM
2 of 5 series · 15 of 46 positions shown, 17 images · IV contrast (APPLIED)
Comparison: September 19, 2018

CLINICAL DATA: Increased pain at the hernia site.

EXAM:
CT ABDOMEN AND PELVIS WITH CONTRAST
TECHNIQUE: Multidetector CT imaging of the abdomen and pelvis was performed
using the standard protocol following bolus administration of
intravenous contrast.
CONTRAST:  100mL OMNIPAQUE IOHEXOL 300 MG/ML  SOLN

[Series 2: axial st · axial · 0.98mm/px · z∈[-431,-6]mm · 12 of 95 slices shown, 14 images]
[im 5/95  soft-tissue]
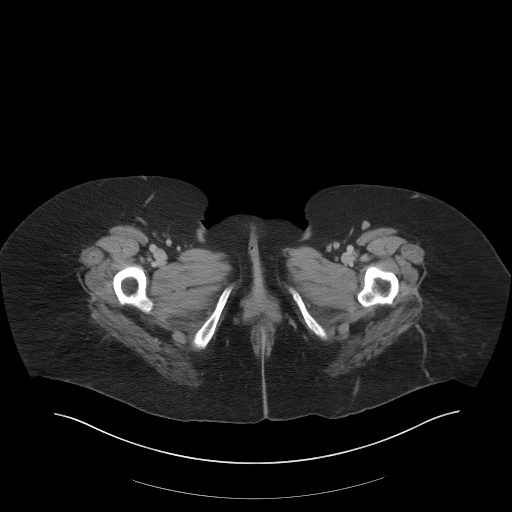
[im 5/95  bone]
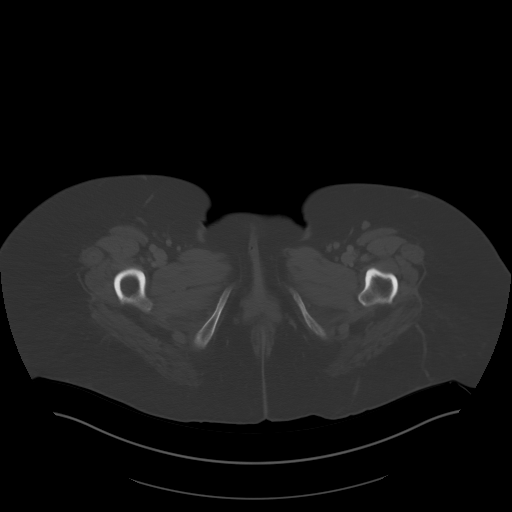
[im 15/95  soft-tissue]
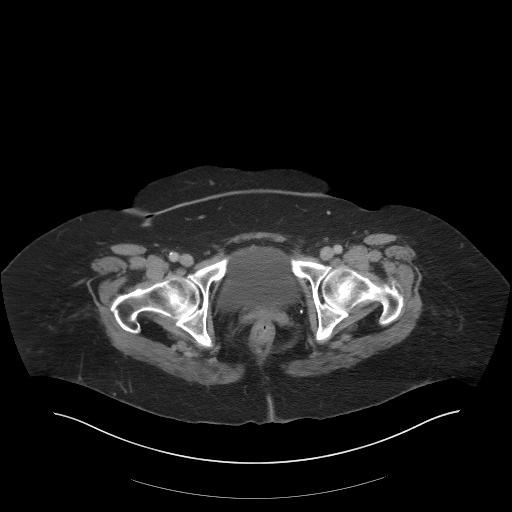
[im 20/95  soft-tissue]
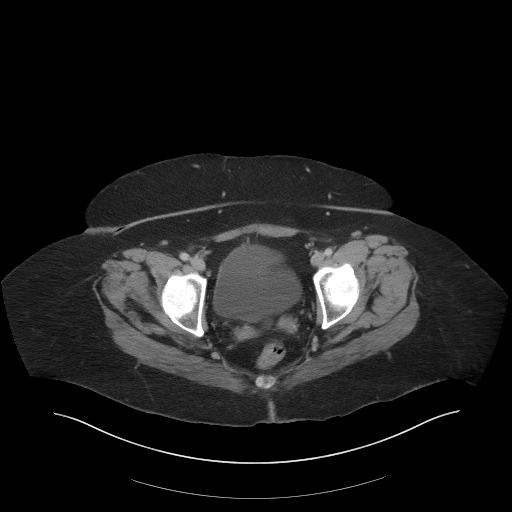
[im 30/95  soft-tissue]
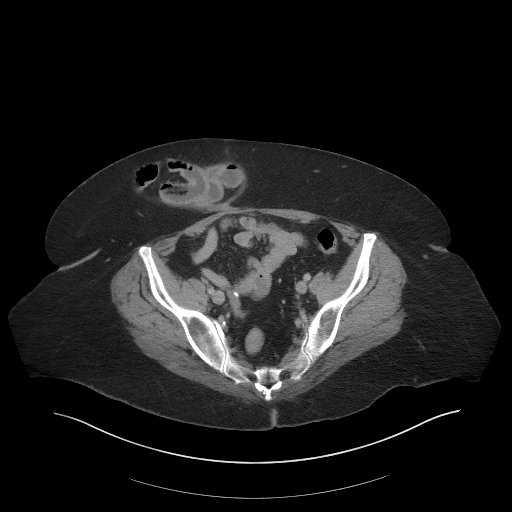
[im 35/95  soft-tissue]
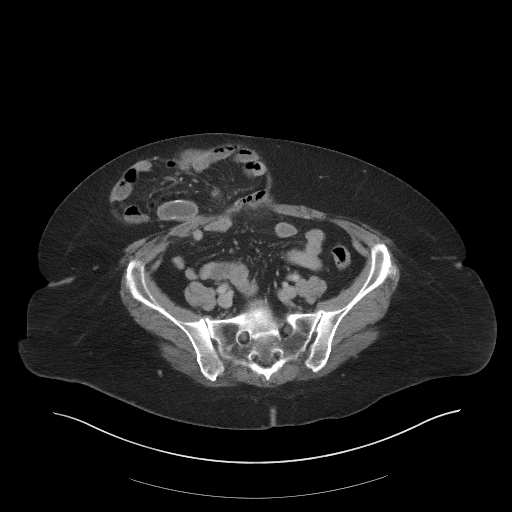
[im 45/95  soft-tissue]
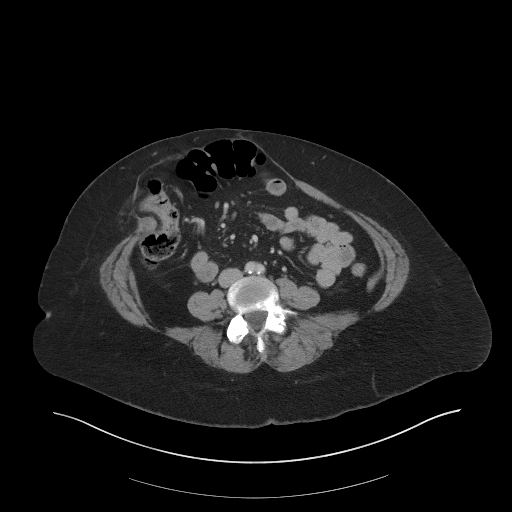
[im 50/95  soft-tissue]
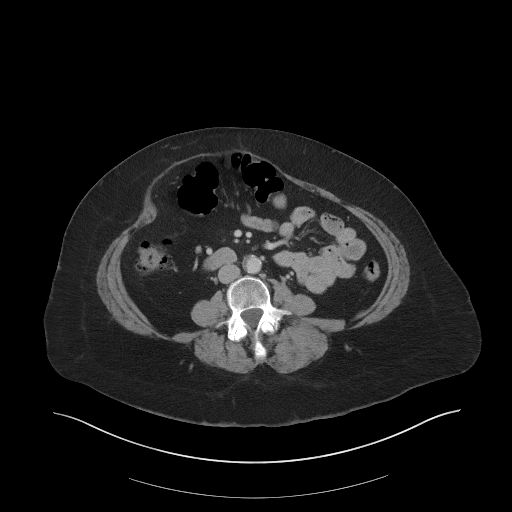
[im 60/95  soft-tissue]
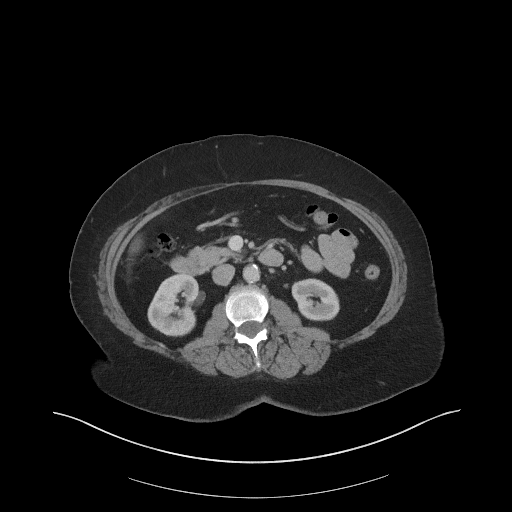
[im 65/95  soft-tissue]
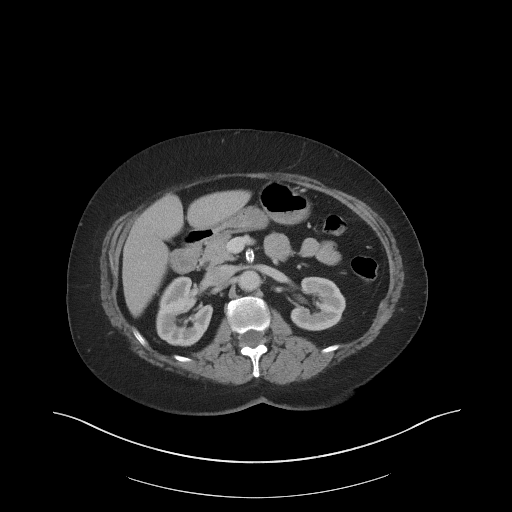
[im 65/95  bone]
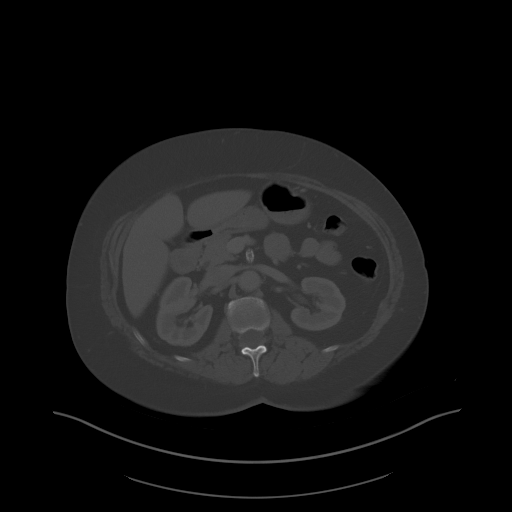
[im 75/95  soft-tissue]
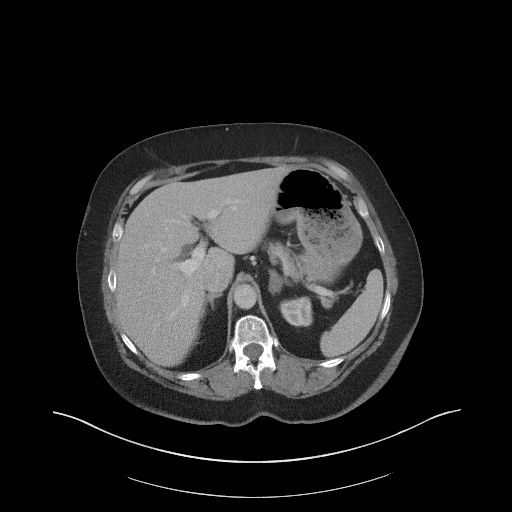
[im 80/95  soft-tissue]
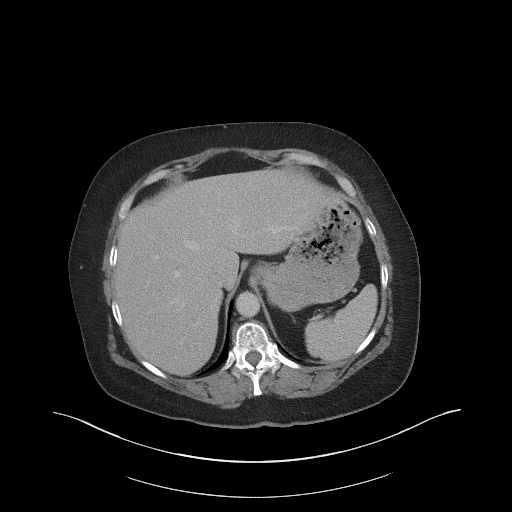
[im 90/95  soft-tissue]
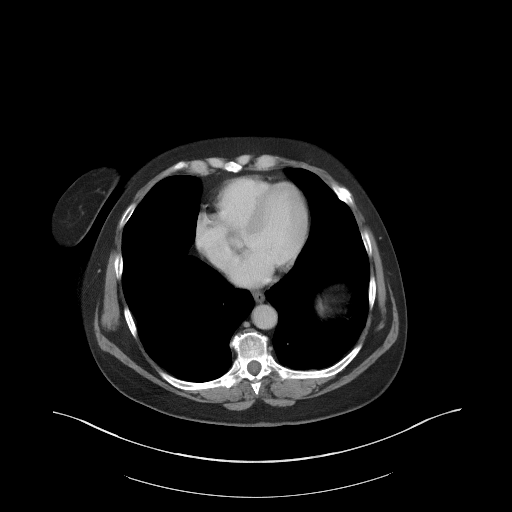

[Series 5: coronal st · coronal · 0.68mm/px · 3 of 97 slices shown]
[im 33/97  soft-tissue]
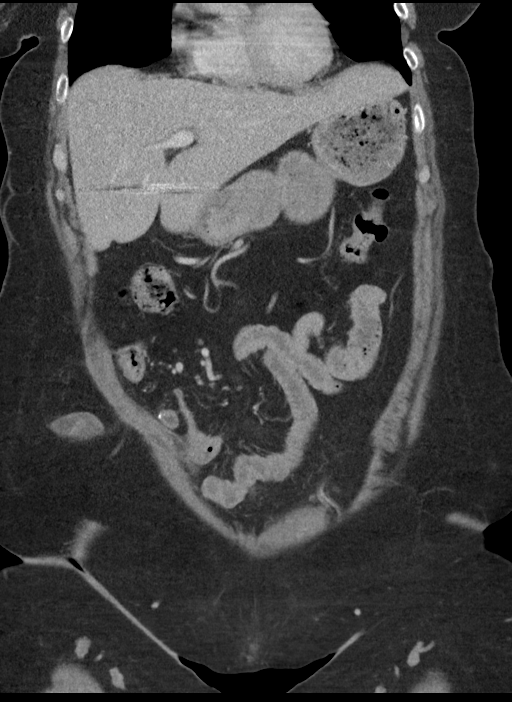
[im 43/97  soft-tissue]
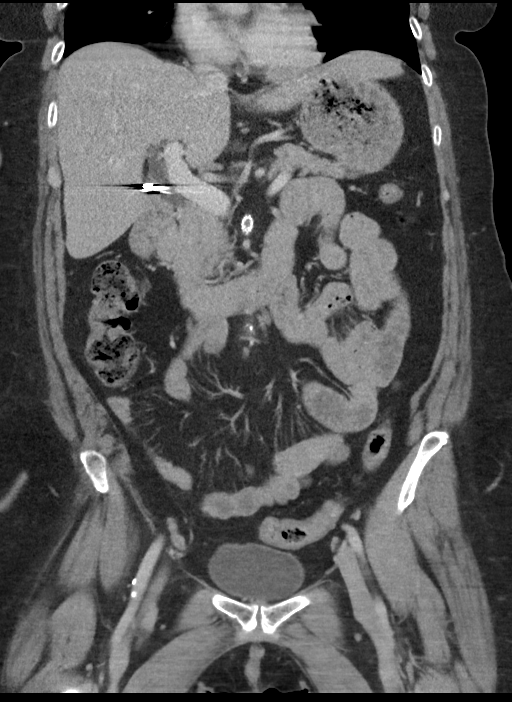
[im 54/97  soft-tissue]
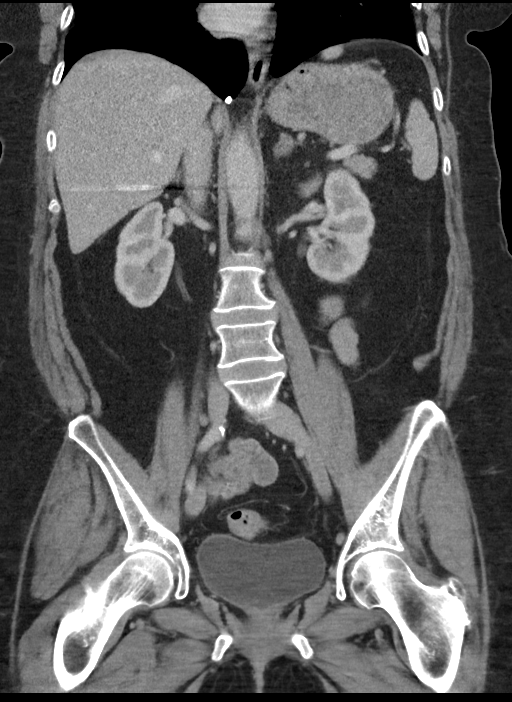

[15 of 46 positions shown; findings below may reference images not displayed]

FINDINGS: Lower chest: Mild scar is identified in the right lung base. The
heart size is normal.

Hepatobiliary: Diffuse low density of liver is identified. No focal
liver lesion is noted. Patient status post prior cholecystectomy.
Postsurgical dilatation of common bile duct measuring 1.3 cm noted.

Pancreas: Unremarkable. No pancreatic ductal dilatation or
surrounding inflammatory changes.

Spleen: Normal in size without focal abnormality.

Adrenals/Urinary Tract: Hypertrophy of left adrenal gland is
unchanged. The right adrenal gland is normal. The kidneys are
normal. No focal kidney lesion is identified no hydronephrosis is
identified bilaterally. The bladder is normal.

Stomach/Bowel: There is a small hiatal hernia. Stomach is otherwise
normal. There is no small bowel obstruction or diverticulitis.
Patient is status post priorbowel surgery of the right lower
quadrant. Patient has a history of prior appendectomy. There is a
large anterior herniation of bowel loops and mesenteric fat in the
lower midline abdomen without evidence of incarceration, unchanged
compared to prior CT.

Vascular/Lymphatic: Aortic atherosclerosis. No enlarged abdominal or
pelvic lymph nodes.

Reproductive: Status post hysterectomy. No adnexal masses.

Other: There is a large anterior herniation of bowel loops and
mesenteric fat in the lower midline abdomen without evidence of
incarceration.

Musculoskeletal: Mild degenerative joint changes of the spine are
noted.
IMPRESSION: No acute abnormality identified.

Large ventral abdominal hernia containing nonobstructed bowel loops
unchanged compared to prior CT.

## 2020-05-03 ENCOUNTER — Other Ambulatory Visit: Payer: Self-pay | Admitting: Family Medicine

## 2020-05-03 DIAGNOSIS — R221 Localized swelling, mass and lump, neck: Secondary | ICD-10-CM

## 2020-05-03 DIAGNOSIS — M2669 Other specified disorders of temporomandibular joint: Secondary | ICD-10-CM

## 2020-05-04 ENCOUNTER — Other Ambulatory Visit: Payer: Self-pay

## 2020-05-04 ENCOUNTER — Ambulatory Visit
Admission: RE | Admit: 2020-05-04 | Discharge: 2020-05-04 | Disposition: A | Discharge status: Home / Self Care | Payer: Medicare Other | Source: Ambulatory Visit | Attending: Physical Medicine and Rehabilitation | Admitting: Physical Medicine and Rehabilitation

## 2020-05-04 DIAGNOSIS — M5416 Radiculopathy, lumbar region: Secondary | ICD-10-CM | POA: Diagnosis not present

## 2020-05-17 ENCOUNTER — Ambulatory Visit (INDEPENDENT_AMBULATORY_CARE_PROVIDER_SITE_OTHER): Payer: Medicare Other | Admitting: Surgery

## 2020-05-17 ENCOUNTER — Encounter: Payer: Self-pay | Admitting: Surgery

## 2020-05-17 ENCOUNTER — Other Ambulatory Visit: Payer: Self-pay

## 2020-05-17 VITALS — BP 143/85 | HR 81 | Temp 98.1°F | Ht 62.0 in | Wt 246.0 lb

## 2020-05-17 DIAGNOSIS — I70222 Atherosclerosis of native arteries of extremities with rest pain, left leg: Secondary | ICD-10-CM

## 2020-05-17 DIAGNOSIS — K439 Ventral hernia without obstruction or gangrene: Secondary | ICD-10-CM | POA: Diagnosis not present

## 2020-05-17 NOTE — Patient Instructions (Addendum)
Continue to follow up with [facility].  Let us know if you would like help with stopping smoking. There is a program we can refer you to.   Follow-up with our office as needed.  Please call and ask to speak with a nurse if you develop questions or concerns.  Follow up here in 3 months

## 2020-05-18 ENCOUNTER — Ambulatory Visit
Admission: RE | Admit: 2020-05-18 | Discharge: 2020-05-18 | Disposition: A | Discharge status: Home / Self Care | Payer: Medicare Other | Source: Ambulatory Visit | Attending: Family Medicine | Admitting: Family Medicine

## 2020-05-18 ENCOUNTER — Other Ambulatory Visit: Payer: Self-pay

## 2020-05-18 DIAGNOSIS — R221 Localized swelling, mass and lump, neck: Secondary | ICD-10-CM

## 2020-05-18 DIAGNOSIS — M2669 Other specified disorders of temporomandibular joint: Secondary | ICD-10-CM | POA: Diagnosis present

## 2020-05-18 MED ORDER — IOHEXOL 300 MG/ML  SOLN
75.0000 mL | Freq: Once | INTRAMUSCULAR | Status: AC | PRN
  Administered 2020-05-18: 75 mL via INTRAVENOUS

## 2020-05-19 ENCOUNTER — Encounter: Payer: Self-pay | Admitting: Surgery

## 2020-05-19 NOTE — Progress Notes (Signed)
Outpatient Surgical Follow Up  05/19/2020  Carla [name] is an 59 y.o. female.   Chief Complaint  Patient presents with  . Follow-up    Ventral Hernia    HPI: [name] is a 59 year old female well-known to me with multiple comorbidities including coronary artery disease, peripheral vascular disease, borderline diabetes and active smoker who I performed laparotomy and small bowel resection for a bowel obstruction secondary to small bowel ischemic stricture 3 years ago.  Of note she also had significant mesentery vascular disease requiring stent which now is thrombosed.  There is no evidence of acute ischemia within the intestines.  She now has developed a large ventral hernia with loss of domain.  She has intermittent abdominal pain related to the ventral hernia that is moderate in intensity.  No evidence of incarceration or strangulation.  Her BMI is 44.9 today and she has actually gained one pound as compared to my prior visit. He still complains of abdominal pain from hernia. sHe still woke about 1 cigarette a day and is trying to quit altogether.  She has significant major depression and has a pending psychiatric evaluation.  She lost her son and her mother 2 years ago and has significant grief issues.  Past Medical History:  Diagnosis Date  . Abdominal aortic atherosclerosis ([place])    a. 05/2017 CTA abd/pelvis: significant atherosclerotic dzs of the infrarenal abd Ao w/ some mural thrombus. No aneurysm or dissection.  . Abdominal pain   . Acute focal ischemia of small intestine (HCC)   . Acute right-sided low back pain with right-sided sciatica 06/13/2017  . AKI (acute kidney injury) ([place]) 07/29/2017  . Baker's cyst of knee, right    a. 07/2016 U/S: 4.1 x 1.4 x 2.9 cystic structure in R poplitetal fossa.  . Bell's palsy   . Borderline diabetes   . Cancer ([place])    skin cancer on nose  . Chest pain    a. 08/2012 Lexiscan MV: EF 54%, non ischemia/infarct.  . CHF (congestive heart failure)  ([place])   . Chronic idiopathic constipation   . Chronic mesenteric ischemia ([place])   . COPD (chronic obstructive pulmonary disease) ([place])   . Diastolic dysfunction    a. 05/2017 Echo: Ef 60-65%, no rwma, Gr1 DD, no source of cardiac emboli.  . 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 05/2017 CTA Abd/pelvis: apparent thrombus or embolus in prox SMA (70-90%); b. 05/2017 catheter directed tPA, mechanical thrombectomy, and stenting of the SMA.  . Essential hypertension with goal blood pressure less than 140/90 01/19/2016  . Gastroesophageal reflux disease 01/19/2016  . GERD (gastroesophageal reflux disease)   . History of kidney stones   . Hyperlipidemia   . Hypertension   . Hypotension 07/29/2017  . Morbid obesity with BMI of 40.0-44.9, adult ([place])   . Occlusive mesenteric ischemia ([place]) 06/19/2017  . Personal history of other malignant neoplasm of skin 06/01/2016  . Pre-diabetes   . Primary osteoarthritis of both knees 06/13/2017  . Recurrent major depressive disorder ([place]) 01/19/2016  . SBO (small bowel obstruction) ([place]) 08/06/2017  . 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 Urinary tract infection 01/09/2014    Past Surgical History:  Procedure Laterality Date  . AMPUTATION TOE Left 12/09/2019   Procedure: AMPUTATION TOE MPJ left;  Surgeon: Caroline More, DPM;  Location: [facility];  Service: Podiatry;  Laterality: Left;  . APPENDECTOMY    . CHOLECYSTECTOMY    . LAPAROTOMY N/A 08/08/2017   Procedure:  EXPLORATORY LAPAROTOMY POSSIBLE BOWEL RESECTION;  Surgeon: Jules Husbands, MD;  Location: [facility];  Service: General;  Laterality: N/A;  . LOWER EXTREMITY ANGIOGRAPHY Left 11/17/2019   Procedure: LOWER EXTREMITY ANGIOGRAPHY;  Surgeon: Algernon Huxley, MD;  Location: [place] [facility];  Service: Cardiovascular;  Laterality: Left;  . LOWER EXTREMITY INTERVENTION N/A 11/19/2019   Procedure: LOWER EXTREMITY INTERVENTION;  Surgeon: Algernon Huxley, MD;  Location: [place] [facility];   Service: Cardiovascular;  Laterality: N/A;  . TEE WITHOUT CARDIOVERSION N/A 06/22/2017   Procedure: TRANSESOPHAGEAL ECHOCARDIOGRAM (TEE);  Surgeon: Wellington Hampshire, MD;  Location: [facility];  Service: Cardiovascular;  Laterality: N/A;  . TOTAL KNEE ARTHROPLASTY Right 07/11/2018   Procedure: TOTAL KNEE ARTHROPLASTY;  Surgeon: Corky Mull, MD;  Location: [facility];  Service: Orthopedics;  Laterality: Right;  [name] VAGINAL HYSTERECTOMY    . VISCERAL ARTERY INTERVENTION N/A 06/20/2017   Procedure: Visceral Artery Intervention, possible aortic thrombectomy;  Surgeon: Algernon Huxley, MD;  Location: [place] [facility];  Service: Cardiovascular;  Laterality: N/A;  . VISCERAL ARTERY INTERVENTION N/A 01/28/2018   Procedure: VISCERAL ARTERY INTERVENTION;  Surgeon: Algernon Huxley, MD;  Location: [place] [facility];  Service: Cardiovascular;  Laterality: N/A;    Family History  Problem Relation Age of Onset  . Hypertension Mother   . Heart disease Mother   . Heart attack Mother   . Breast cancer Mother   . Hypertension Father   . Breast cancer Paternal Aunt     Social History:  reports that she has been smoking cigarettes. She has been smoking about 0.10 packs per day. She has never used smokeless tobacco. She reports that she does not drink alcohol and does not use drugs.  Allergies:  Allergies  Allergen Reactions  . Gabapentin     Tremors   . Bactrim [Sulfamethoxazole-Trimethoprim] Itching    Medications reviewed.    ROS Full ROS performed and is otherwise negative other than what is stated in HPI   BP (!) 143/85   Pulse 81   Temp 98.1 F (36.7 C)   Ht 5\' 2"  (1.575 m)   Wt (!) 246 lb (111.6 kg)   SpO2 98%   BMI 44.99 kg/m   Physical Exam Vitals and nursing note reviewed. Exam conducted with a chaperone present.  Constitutional:      General: She is not in acute distress.    Appearance: Normal appearance. She is obese.  Eyes:     General: No scleral icterus.       Right eye: No  discharge.        Left eye: No discharge.  Cardiovascular:     Rate and Rhythm: Normal rate and regular rhythm.  Pulmonary:     Effort: Pulmonary effort is normal. No respiratory distress.     Breath sounds: Normal breath sounds. No stridor.  Abdominal:     General: Bowel sounds are normal. There is no distension.     Palpations: Abdomen is soft. There is no mass.     Tenderness: There is abdominal tenderness. There is no guarding or rebound.     Hernia: A hernia is present.     Comments: Large pannus.  Reducible ventral hernia with loss of domain of the abdominal wall.  No peritonitis  Musculoskeletal:     Cervical back: Normal range of motion and neck supple. No rigidity.  Skin:    General: Skin is warm and dry.     Capillary Refill:  Capillary refill takes less than 2 seconds.  Neurological:     General: No focal deficit present.     Mental Status: She is alert and oriented to person, place, and time.  Psychiatric:        Mood and Affect: Mood normal.        Behavior: Behavior normal.        Thought Content: Thought content normal.        Judgment: Judgment normal.        No results found for this or any previous visit (from the past 48 hour(s)). CT SOFT TISSUE NECK W CONTRAST  Result Date: 05/19/2020 CLINICAL DATA:  Provided history: Nodule neck, TMJ inflammation. Additional provided: Patient reports right submandibular mass for 2-3 years, patient reports right TMJ will come "in and out," former smoker. EXAM: CT NECK WITH CONTRAST TECHNIQUE: Multidetector CT imaging of the neck was performed using the standard protocol following the bolus administration of intravenous contrast. CONTRAST:  17mL OMNIPAQUE IOHEXOL 300 MG/ML  SOLN COMPARISON:  No pertinent prior exams are available for comparison. FINDINGS: Pharynx and larynx: Nonspecific soft tissue fullness within the left aspect of the nasopharynx without definite discrete mass identified. No swelling or discrete mass is  identified within the oral cavity, pharynx or larynx. Salivary glands: There are 2 nodules within the posterior right parotid gland. There is a 10 mm homogeneously enhancing nodule along the superficial aspect of the posterior gland (series 2, image 29). There is an additional 10 mm nodule more medially which demonstrates heterogeneous enhancement (series 2, image 29). These nodules are located immediately deep to the right-sided skin surface marker. The left parotid and bilateral submandibular glands are unremarkable. Thyroid: Unremarkable. Lymph nodes: 2 nodules within the posterior right parotid gland as described. Elsewhere within the neck, no pathologically enlarged cervical chain lymph nodes are demonstrated. Vascular: The major vascular structures of the neck are patent. Atherosclerotic plaque within the carotid bifurcations and proximal internal carotid arteries bilaterally. Limited intracranial: No abnormality identified. Visualized orbits: Unremarkable Mastoids and visualized paranasal sinuses: No significant paranasal sinus disease or mastoid effusion at the imaged levels. Skeleton: No acute bony abnormality or aggressive osseous lesion. Cervical spondylosis greatest at C6-C7. Trace C2-C3 grade 1 anterolisthesis. Upper chest: No consolidation within the imaged lung apices. Other: The temporomandibular joints are grossly unremarkable. These results will be called to the ordering clinician or representative by the Radiologist Assistant, and communication documented in the PACS or [employer]. IMPRESSION: Two nodules are present within the posterior right parotid gland both measuring 10 mm. The more medial of the two nodules demonstrates heterogeneous enhancement and is highly suspicious for a primary parotid neoplasm. The more superficial of the two nodules demonstrates homogeneous enhancement and may reflect a nonspecific enlarged intraparotid lymph node or primary parotid neoplasm. ENT consultation  is recommended. Consider direct tissue sampling. Subtle nonspecific asymmetric soft tissue prominence within the left posterior nasopharynx. Direct visualization is recommended to exclude a mass at this site. The temporomandibular joints are grossly unremarkable. Cervical spondylosis, greatest at C6-C7. Atherosclerotic plaque within the carotid bifurcations and proximal internal carotid arteries. Electronically Signed   By: Kellie Simmering DO   On: 05/19/2020 07:39    Assessment/Plan:  59 year old female with a symptomatic ventral hernia on morbidly obese patient with a BMI of 45 with significant comorbidities.  She is an active smoker and still not optimized from a medical perspective.  I again have encouraged her to see a bariatric surgeon and also she is  willing to do that.  We have also reviewed different alternative to smoking cessation.  She was very tearful during my visit today and I performed supportive psychotherapy.  I will see her back in the next 3 to 6 months to do another follow-up.  She does not require any emergent surgeries at this time.  She will need to be optimized from weight perspective as well as comorbidities before we attempt a major abdominal wall reconstruction.   Greater than 50% of the 45 minutes  visit was spent in counseling/coordination of care   Caroleen Hamman, MD [facility] Surgeon

## 2020-05-20 ENCOUNTER — Ambulatory Visit: Payer: Medicare Other | Attending: Internal Medicine

## 2020-06-01 ENCOUNTER — Other Ambulatory Visit: Payer: Self-pay | Admitting: Internal Medicine

## 2020-06-03 ENCOUNTER — Other Ambulatory Visit: Payer: Self-pay | Admitting: Otolaryngology

## 2020-06-03 DIAGNOSIS — K118 Other diseases of salivary glands: Secondary | ICD-10-CM

## 2020-06-09 ENCOUNTER — Other Ambulatory Visit: Payer: Self-pay | Admitting: Otolaryngology

## 2020-06-09 ENCOUNTER — Other Ambulatory Visit: Payer: Self-pay | Admitting: Student

## 2020-06-09 DIAGNOSIS — K118 Other diseases of salivary glands: Secondary | ICD-10-CM

## 2020-06-09 NOTE — Progress Notes (Signed)
Patient on schedule for parotid biopsy 06/10/2020, called and spoke with patient on phone with pre procedure instructions given. Made aware to be here @ 1230, NPO after 0600 in case sedation needed, and driver for discharge after procedure. Stated understanding. Also holding Eliquis from 8/17, and approved per Dr Vernard Gambles who is aware of this for procedure. 8/19

## 2020-06-10 ENCOUNTER — Ambulatory Visit
Admission: RE | Admit: 2020-06-10 | Discharge: 2020-06-10 | Disposition: A | Discharge status: Home / Self Care | Payer: Medicare Other | Source: Ambulatory Visit | Attending: Otolaryngology | Admitting: Otolaryngology

## 2020-06-10 ENCOUNTER — Other Ambulatory Visit: Payer: Self-pay

## 2020-06-10 DIAGNOSIS — K118 Other diseases of salivary glands: Secondary | ICD-10-CM

## 2020-06-10 HISTORY — DX: Acquired absence of other specified parts of digestive tract: Z90.49

## 2020-06-10 MED ORDER — FENTANYL CITRATE (PF) 100 MCG/2ML IJ SOLN
INTRAMUSCULAR | Status: AC
  Filled 2020-06-10: qty 4

## 2020-06-10 MED ORDER — FENTANYL CITRATE (PF) 100 MCG/2ML IJ SOLN
INTRAMUSCULAR | Status: AC | PRN
  Administered 2020-06-10: 25 ug via INTRAVENOUS
  Administered 2020-06-10: 50 ug via INTRAVENOUS

## 2020-06-10 MED ORDER — SODIUM CHLORIDE 0.9 % IV SOLN: INTRAVENOUS | Status: DC

## 2020-06-10 MED ORDER — MIDAZOLAM HCL 5 MG/5ML IJ SOLN
INTRAMUSCULAR | Status: AC | PRN
  Administered 2020-06-10: 0.5 mg via INTRAVENOUS
  Administered 2020-06-10: 1 mg via INTRAVENOUS

## 2020-06-10 MED ORDER — MIDAZOLAM HCL 5 MG/5ML IJ SOLN
INTRAMUSCULAR | Status: AC
  Filled 2020-06-10: qty 5

## 2020-06-10 MED ORDER — HYDROCODONE-ACETAMINOPHEN 5-325 MG PO TABS: 1.0000 | ORAL_TABLET | ORAL | Status: DC | PRN

## 2020-06-10 NOTE — Procedures (Signed)
  Procedure: [place] FNA biopsy R parotid nodule x2   EBL:   minimal Complications:  none immediate  See full dictation in [facility].  Dillard Cannon MD Main # [phone] Pager  [phone]

## 2020-06-10 NOTE — Discharge Instructions (Signed)

## 2020-06-11 LAB — CYTOLOGY - NON PAP

## 2020-06-15 ENCOUNTER — Telehealth (INDEPENDENT_AMBULATORY_CARE_PROVIDER_SITE_OTHER): Payer: Self-pay | Admitting: Vascular Surgery

## 2020-06-15 NOTE — Telephone Encounter (Signed)
See note below per Eulogio Ditch NP. Thank you

## 2020-06-15 NOTE — Telephone Encounter (Signed)
Bring her in with abis...me or dew.Marland KitchenMarland Kitchenpreferably this week

## 2020-06-15 NOTE — Telephone Encounter (Signed)
Called stating that her right side down to her right leg has been giving her problems for the past 11days. She states that she can barely walk on the leg without having to stop to rest and the leg and foot goes numb. Patient was last seen 03-30-20 with ABI studies ([facility]). Please advise.

## 2020-06-15 NOTE — Telephone Encounter (Signed)
See note below

## 2020-06-16 ENCOUNTER — Other Ambulatory Visit (INDEPENDENT_AMBULATORY_CARE_PROVIDER_SITE_OTHER): Payer: Self-pay | Admitting: Nurse Practitioner

## 2020-06-16 DIAGNOSIS — M79604 Pain in right leg: Secondary | ICD-10-CM

## 2020-06-17 ENCOUNTER — Telehealth (INDEPENDENT_AMBULATORY_CARE_PROVIDER_SITE_OTHER): Payer: Self-pay

## 2020-06-17 ENCOUNTER — Encounter (INDEPENDENT_AMBULATORY_CARE_PROVIDER_SITE_OTHER): Payer: Self-pay | Admitting: Nurse Practitioner

## 2020-06-17 ENCOUNTER — Encounter (INDEPENDENT_AMBULATORY_CARE_PROVIDER_SITE_OTHER): Payer: Self-pay

## 2020-06-17 ENCOUNTER — Ambulatory Visit (INDEPENDENT_AMBULATORY_CARE_PROVIDER_SITE_OTHER): Payer: Medicare Other

## 2020-06-17 ENCOUNTER — Ambulatory Visit (INDEPENDENT_AMBULATORY_CARE_PROVIDER_SITE_OTHER): Payer: Medicare Other | Admitting: Nurse Practitioner

## 2020-06-17 ENCOUNTER — Other Ambulatory Visit
Admission: RE | Admit: 2020-06-17 | Discharge: 2020-06-17 | Disposition: A | Discharge status: Home / Self Care | Payer: Medicare Other | Source: Ambulatory Visit | Attending: Vascular Surgery | Admitting: Vascular Surgery

## 2020-06-17 ENCOUNTER — Other Ambulatory Visit: Payer: Self-pay

## 2020-06-17 VITALS — BP 142/86 | HR 111 | Resp 18 | Wt 243.0 lb

## 2020-06-17 DIAGNOSIS — F17200 Nicotine dependence, unspecified, uncomplicated: Secondary | ICD-10-CM

## 2020-06-17 DIAGNOSIS — Z01812 Encounter for preprocedural laboratory examination: Secondary | ICD-10-CM | POA: Insufficient documentation

## 2020-06-17 DIAGNOSIS — I70229 Atherosclerosis of native arteries of extremities with rest pain, unspecified extremity: Secondary | ICD-10-CM

## 2020-06-17 DIAGNOSIS — I10 Essential (primary) hypertension: Secondary | ICD-10-CM | POA: Diagnosis not present

## 2020-06-17 DIAGNOSIS — E785 Hyperlipidemia, unspecified: Secondary | ICD-10-CM

## 2020-06-17 DIAGNOSIS — Z20822 Contact with and (suspected) exposure to covid-19: Secondary | ICD-10-CM | POA: Insufficient documentation

## 2020-06-17 DIAGNOSIS — M79604 Pain in right leg: Secondary | ICD-10-CM

## 2020-06-17 LAB — SARS CORONAVIRUS 2 (TAT 6-24 HRS): SARS Coronavirus 2: NEGATIVE

## 2020-06-17 MED ORDER — OXYCODONE-ACETAMINOPHEN 5-325 MG PO TABS: 1.0000 | ORAL_TABLET | ORAL | 0 refills | Status: DC | PRN

## 2020-06-17 NOTE — Telephone Encounter (Signed)
Patient was seen today and scheduled for a RLE angio with Dr. Lucky Cowboy on 06/21/20 with a 1:15 pm arrival time to the [facility]. Covid testing is today 06/17/20 between 8-1 pm at the [facility]. Pre-procedure instructions were discussed and were handed to the patient.

## 2020-06-17 NOTE — Progress Notes (Signed)
Subjective:    Patient ID: Carla [name], female    DOB: 1961-07-06, 59 y.o.   MRN: [id number] Chief Complaint  Patient presents with  . Follow-up    right leg pain     The patient returns to the office for followup and review of the noninvasive studies. There has been a significant deterioration in the lower extremity symptoms.  The patient notes that approximately 2 months ago she received a shot in her back for some of her lumbar issues in the next day or so she began to have severe pain in her right lower extremity.  The patient notes that she is unable to walk for extended distances before having severe pain and weakness and has had several instances of nearly falling.  The patient notes that her right hip hurts and is very sore to the touch.  The patient also has severe rest pain symptoms.   No new ulcers or wounds have occurred since the last visit.  There have been no significant changes to the patient's overall health care.  The patient denies amaurosis fugax or recent TIA symptoms. There are no recent neurological changes noted. The patient denies history of DVT, PE or superficial thrombophlebitis. The patient denies recent episodes of angina or shortness of breath.   ABI's Rt=0.41 and Lt=1.11 (previous ABI's Rt=1.12 and Lt=1.21) Duplex US of the lower extremity arterial system shows dampened monophasic waveforms in the right tibial arteries with biphasic waveforms in the left tibial arteries.   Review of Systems  Cardiovascular:       Rest pain and severe claudication  Neurological: Positive for weakness.  All other systems reviewed and are negative.      Objective:   Physical Exam Vitals reviewed.  HENT:     Head: Normocephalic.  Cardiovascular:     Rate and Rhythm: Normal rate and regular rhythm.     Pulses:          Dorsalis pedis pulses are 0 on the right side and 1+ on the left side.       Posterior tibial pulses are 0 on the right side and 1+ on the left side.    Pulmonary:     Effort: Pulmonary effort is normal.  Skin:    General: Skin is warm.  Neurological:     Mental Status: She is alert and oriented to person, place, and time.     Motor: Weakness present.     Gait: Gait abnormal.  Psychiatric:        Mood and Affect: Mood normal. Affect is tearful.        Behavior: Behavior normal.        Thought Content: Thought content normal.        Judgment: Judgment normal.     BP (!) 142/86 (BP Location: Right Arm)   Pulse (!) 111   Resp 18   Wt 243 lb (110.2 kg)   BMI 44.45 kg/m   Past Medical History:  Diagnosis Date  . Abdominal aortic atherosclerosis ([place])    a. 05/2017 CTA abd/pelvis: significant atherosclerotic dzs of the infrarenal abd Ao w/ some mural thrombus. No aneurysm or dissection.  . Abdominal pain   . Acute focal ischemia of small intestine (H[facility])   . Acute right-sided low back pain with right-sided sciatica 06/13/2017  . AKI (acute kidney injury) ([place]) 07/29/2017  . Baker's cyst of knee, right    a. 07/2016 U/S: 4.1 x 1.4 x 2.9 cystic structure in R  poplitetal fossa.  . Bell's palsy   . Borderline diabetes   . Cancer ([place])    skin cancer on nose  . Chest pain    a. 08/2012 Lexiscan MV: EF 54%, non ischemia/infarct.  . CHF (congestive heart failure) ([place])   . Chronic idiopathic constipation   . Chronic mesenteric ischemia ([place])   . COPD (chronic obstructive pulmonary disease) ([place])   . Diastolic dysfunction    a. 05/2017 Echo: Ef 60-65%, no rwma, Gr1 DD, no source of cardiac emboli.  . 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 05/2017 CTA Abd/pelvis: apparent thrombus or embolus in prox SMA (70-90%); b. 05/2017 catheter directed tPA, mechanical thrombectomy, and stenting of the SMA.  . Essential hypertension with goal blood pressure less than 140/90 01/19/2016  . Gastroesophageal reflux disease 01/19/2016  . GERD (gastroesophageal reflux disease)   . H/O colectomy   . History of kidney stones   . Hyperlipidemia    . Hypertension   . Hypotension 07/29/2017  . Morbid obesity with BMI of 40.0-44.9, adult ([place])   . Occlusive mesenteric ischemia ([place]) 06/19/2017  . Personal history of other malignant neoplasm of skin 06/01/2016  . Pre-diabetes   . Primary osteoarthritis of both knees 06/13/2017  . Recurrent major depressive disorder ([place]) 01/19/2016  . SBO (small bowel obstruction) ([place]) 08/06/2017  . 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 Urinary tract infection 01/09/2014    Social History   Socioeconomic History  . Marital status: Single    Spouse name: Not on file  . Number of children: Not on file  . Years of education: Not on file  . Highest education level: Not on file  Occupational History  . Occupation: Unemployed  Tobacco Use  . Smoking status: Current Some Day Smoker    Packs/day: 0.10    Types: Cigarettes  . Smokeless tobacco: Never Used  Vaping Use  . Vaping Use: Never used  Substance and Sexual Activity  . Alcohol use: No  . Drug use: No  . Sexual activity: Never  Other Topics Concern  . Not on file  Social History Narrative   Lives in [place] with boyfriend.  Does not routinely exercise.   Social Determinants of Health   Financial Resource Strain:   . Difficulty of Paying Living Expenses: Not on file  Food Insecurity:   . Worried About Charity fundraiser in the Last Year: Not on file  . Ran Out of Food in the Last Year: Not on file  Transportation Needs:   . Lack of Transportation (Medical): Not on file  . Lack of Transportation (Non-Medical): Not on file  Physical Activity:   . Days of Exercise per Week: Not on file  . Minutes of Exercise per Session: Not on file  Stress:   . Feeling of Stress : Not on file  Social Connections:   . Frequency of Communication with Friends and Family: Not on file  . Frequency of Social Gatherings with Friends and Family: Not on file  . Attends Religious Services: Not on file  . Active Member of Clubs or Organizations: Not  on file  . Attends Archivist Meetings: Not on file  . Marital Status: Not on file  Intimate Partner Violence:   . Fear of Current or Ex-Partner: Not on file  . Emotionally Abused: Not on file  . Physically Abused: Not on file  . Sexually Abused: Not on file    Past Surgical History:  Procedure Laterality Date  . AMPUTATION TOE Left 12/09/2019   Procedure: AMPUTATION TOE MPJ left;  Surgeon: Caroline More, DPM;  Location: [facility];  Service: Podiatry;  Laterality: Left;  . APPENDECTOMY    . CHOLECYSTECTOMY    . LAPAROTOMY N/A 08/08/2017   Procedure: EXPLORATORY LAPAROTOMY POSSIBLE BOWEL RESECTION;  Surgeon: Jules Husbands, MD;  Location: [facility];  Service: General;  Laterality: N/A;  . LOWER EXTREMITY ANGIOGRAPHY Left 11/17/2019   Procedure: LOWER EXTREMITY ANGIOGRAPHY;  Surgeon: Algernon Huxley, MD;  Location: [facility];  Service: Cardiovascular;  Laterality: Left;  . LOWER EXTREMITY INTERVENTION N/A 11/19/2019   Procedure: LOWER EXTREMITY INTERVENTION;  Surgeon: Algernon Huxley, MD;  Location: [place] [facility];  Service: Cardiovascular;  Laterality: N/A;  . TEE WITHOUT CARDIOVERSION N/A 06/22/2017   Procedure: TRANSESOPHAGEAL ECHOCARDIOGRAM (TEE);  Surgeon: Wellington Hampshire, MD;  Location: [facility];  Service: Cardiovascular;  Laterality: N/A;  . TOTAL KNEE ARTHROPLASTY Right 07/11/2018   Procedure: TOTAL KNEE ARTHROPLASTY;  Surgeon: Corky Mull, MD;  Location: [facility];  Service: Orthopedics;  Laterality: Right;  [name] VAGINAL HYSTERECTOMY    . VISCERAL ARTERY INTERVENTION N/A 06/20/2017   Procedure: Visceral Artery Intervention, possible aortic thrombectomy;  Surgeon: Algernon Huxley, MD;  Location: [facility] [facility];  Service: Cardiovascular;  Laterality: N/A;  . VISCERAL ARTERY INTERVENTION N/A 01/28/2018   Procedure: VISCERAL ARTERY INTERVENTION;  Surgeon: Algernon Huxley, MD;  Location: [facility] [facility];  Service: Cardiovascular;  Laterality: N/A;    Family History    Problem Relation Age of Onset  . Hypertension Mother   . Heart disease Mother   . Heart attack Mother   . Breast cancer Mother   . Hypertension Father   . Breast cancer Paternal Aunt     Allergies  Allergen Reactions  . Gabapentin     Tremors   . Bactrim [Sulfamethoxazole-Trimethoprim] Itching       Assessment & Plan:   1. Rest pain of lower extremity due to atherosclerosis [facility]) Recommend:  The patient has evidence of severe atherosclerotic changes of the right lower extremities with rest pain that is associated with preulcerative changes and impending tissue loss of the foot.  This represents a limb threatening ischemia and places the patient at the risk for limb loss.  Patient should undergo angiography of the right extremity with the hope for intervention for limb salvage.  The risks and benefits as well as the alternative therapies was discussed in detail with the patient.  All questions were answered.  Patient agrees to proceed with angiography.  The patient will follow up with me in the office after the procedure.      - oxyCODONE-acetaminophen (PERCOCET/ROXICET) 5-325 MG tablet; Take 1 tablet by mouth every 4 (four) hours as needed for severe pain.  Dispense: 50 tablet; Refill: 0  2. Essential hypertension with goal blood pressure less than 140/90 Continue antihypertensive medications as already ordered, these medications have been reviewed and there are no changes at this time.   3. Hyperlipidemia, unspecified hyperlipidemia type Continue statin as ordered and reviewed, no changes at this time   4. Tobacco use disorder Smoking cessation was discussed, 3-10 minutes spent on this topic specifically    Current Outpatient Medications on File Prior to Visit  Medication Sig Dispense Refill  . albuterol (VENTOLIN HFA) 108 (90 Base) MCG/ACT inhaler Inhale 2 puffs into the lungs every 6 (six) hours as needed for wheezing or shortness of breath.     [name]  amitriptyline  (ELAVIL) 25 MG tablet Take 25 mg by mouth at bedtime.    [name] amLODipine (NORVASC) 5 MG tablet Take 1 tablet (5 mg total) by mouth daily. 30 tablet 2  . apixaban (ELIQUIS) 5 MG TABS tablet Take 5 mg by mouth 2 (two) times daily.    [name] atorvastatin (LIPITOR) 80 MG tablet Take 80 mg by mouth at bedtime.     [name] buPROPion (WELLBUTRIN XL) 150 MG 24 hr tablet Take 150 mg by mouth daily.     . clotrimazole-betamethasone (LOTRISONE) cream Apply 1 application topically 2 (two) times daily as needed (for dry skin).     . DULoxetine (CYMBALTA) 60 MG capsule Take 60 mg by mouth daily.    . ferrous sulfate 325 (65 FE) MG tablet Take 325 mg by mouth daily.    . Ipratropium-Albuterol (COMBIVENT) 20-100 MCG/ACT AERS respimat Inhale into the lungs.    . metoprolol tartrate (LOPRESSOR) 50 MG tablet Take 1 tablet (50 mg total) by mouth 2 (two) times daily. 30 tablet 0  . Multiple Vitamin (MULTIVITAMIN WITH MINERALS) TABS tablet Take 1 tablet by mouth daily. 90 tablet 3  . omeprazole (PRILOSEC) 20 MG capsule Take 20 mg by mouth 2 (two) times daily before a meal.    . ondansetron (ZOFRAN) 4 MG tablet TAKE 1 TABLET BY MOUTH EVERY 8 HOURS AS NEEDED FOR UP TO 7 DAYS FOR NAUSEA OR VOMITING    . oxybutynin (DITROPAN-XL) 10 MG 24 hr tablet Take 10 mg by mouth daily.    [name] topiramate (TOPAMAX) 50 MG tablet Take by mouth.    . gabapentin (NEURONTIN) 100 MG capsule Take 1 capsule (100 mg total) by mouth 2 (two) times daily. (Patient not taking: Reported on 06/10/2020) 180 capsule 3  . topiramate (TOPAMAX) 25 MG tablet Take 25 mg by mouth 2 (two) times daily.  (Patient not taking: Reported on 06/10/2020)     No current facility-administered medications on file prior to visit.    There are no Patient Instructions on file for this visit. No follow-ups on file.   Kris Hartmann, NP

## 2020-06-20 ENCOUNTER — Encounter: Payer: Self-pay | Admitting: Emergency Medicine

## 2020-06-20 ENCOUNTER — Emergency Department
Admission: EM | Admit: 2020-06-20 | Discharge: 2020-06-20 | Disposition: A | Discharge status: Home / Self Care | Payer: Medicare Other | Attending: Emergency Medicine | Admitting: Emergency Medicine

## 2020-06-20 ENCOUNTER — Other Ambulatory Visit (INDEPENDENT_AMBULATORY_CARE_PROVIDER_SITE_OTHER): Payer: Self-pay | Admitting: Nurse Practitioner

## 2020-06-20 ENCOUNTER — Other Ambulatory Visit: Payer: Self-pay

## 2020-06-20 ENCOUNTER — Emergency Department: Payer: Medicare Other

## 2020-06-20 DIAGNOSIS — M79605 Pain in left leg: Secondary | ICD-10-CM | POA: Diagnosis not present

## 2020-06-20 DIAGNOSIS — Z5321 Procedure and treatment not carried out due to patient leaving prior to being seen by health care provider: Secondary | ICD-10-CM | POA: Insufficient documentation

## 2020-06-20 NOTE — ED Triage Notes (Signed)
Patient states that she has blood clots in her right leg. Patient states that her left leg started hurting today and she is concerned that she has a blood clot in her left leg.

## 2020-06-20 NOTE — ED Notes (Signed)
Patient offered Ibuprofen at this time but the patient declines it.

## 2020-06-21 ENCOUNTER — Encounter: Admission: RE | Payer: Self-pay | Source: Home / Self Care

## 2020-06-21 ENCOUNTER — Telehealth (INDEPENDENT_AMBULATORY_CARE_PROVIDER_SITE_OTHER): Payer: Self-pay

## 2020-06-21 ENCOUNTER — Ambulatory Visit: Admission: RE | Admit: 2020-06-21 | Payer: Medicare Other | Source: Home / Self Care | Admitting: Vascular Surgery

## 2020-06-21 HISTORY — PX: OTHER SURGICAL HISTORY: SHX169

## 2020-06-21 SURGERY — LOWER EXTREMITY ANGIOGRAPHY
Anesthesia: Moderate Sedation | Site: Leg Lower | Laterality: Right

## 2020-06-21 MED ORDER — CEFAZOLIN SODIUM-DEXTROSE 2-4 GM/100ML-% IV SOLN: 2.0000 g | Freq: Once | INTRAVENOUS | Status: DC

## 2020-06-21 NOTE — Telephone Encounter (Signed)
Spoke with Sharyn Lull in [place] at [facility] and the patient has been canceled for her leg angio with Dr. Lucky Cowboy. Per Sharyn Lull the patient was seen at the ED on 06/20/20 and was not happy with her service and went to [facility] and is now having surgery today.

## 2020-06-29 ENCOUNTER — Telehealth (INDEPENDENT_AMBULATORY_CARE_PROVIDER_SITE_OTHER): Payer: Self-pay | Admitting: Vascular Surgery

## 2020-06-29 NOTE — Telephone Encounter (Signed)
I spoke with Dr. Lucky Cowboy and if the patient is still in [facility] (the hospital) and receiving care there, she should consult with the vascular team there,  as she is still under their care.  Dr. Lucky Cowboy said if they need to consult or would like to speak to him, they can contact our office and he would be happy to speak with them.  Otherwise, if she would like a second opinion after she is discharged, we can have her come in this ABIs and a bilateral art duplex to see me or dew

## 2020-06-29 NOTE — Telephone Encounter (Signed)
Called stating that she is in [place] where she has had surgery to have DVT's removed, she states that they successfully removed all except one . Patient is wanting to communicate with JD's Np or JD himself on this matter. Patient was last seen 06-17-20 with ABI studies with [facility]. Please advise.

## 2020-06-30 NOTE — Telephone Encounter (Signed)
I relayed NP's message below to patient. She understands she can come in to be seen once she is discharged from [facility].

## 2020-07-19 IMAGING — CT CT ANGIOGRAPHY CHEST
4 of 7 series · 17 of 46 positions shown · IV contrast (APPLIED)
Comparison: CTA chest 04/07/2018

CLINICAL DATA: Hives. Severe hypertension. Epigastric and chest
pain extending to the back.

EXAM:
CT ANGIOGRAPHY CHEST WITH CONTRAST
TECHNIQUE: Multidetector CT imaging of the chest was performed using the
standard protocol during bolus administration of intravenous
contrast. Multiplanar CT image reconstructions and MIPs were
obtained to evaluate the vascular anatomy.
CONTRAST:  75mL OMNIPAQUE IOHEXOL 350 MG/ML SOLN

[Series 3: axial pre · axial · non-contrast · 0.65mm/px · z∈[-754,-584]mm · 5 of 52 slices shown]
[im 9/52  lung]
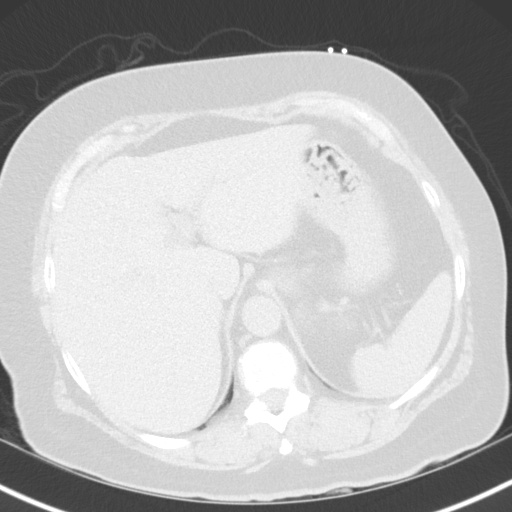
[im 18/52  lung]
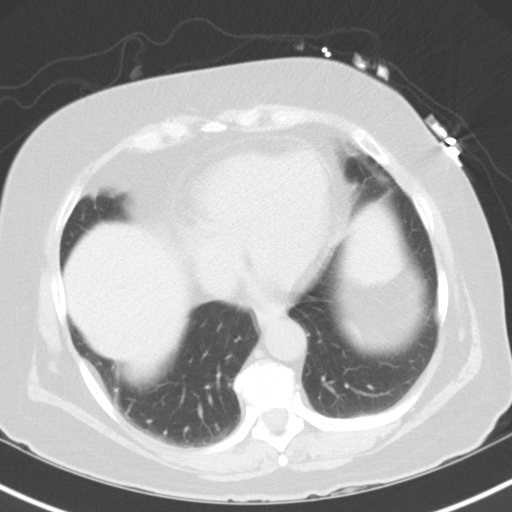
[im 26/52  lung]
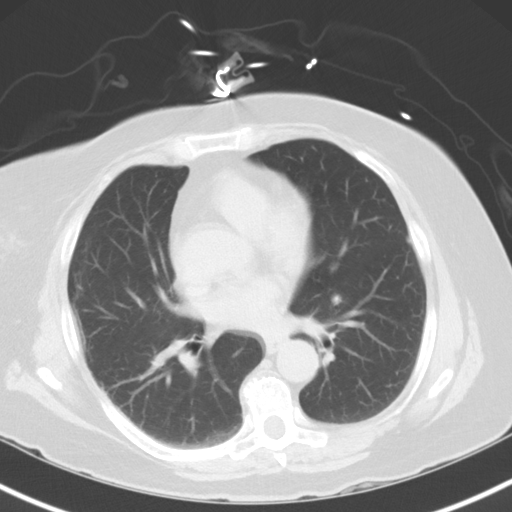
[im 35/52  lung]
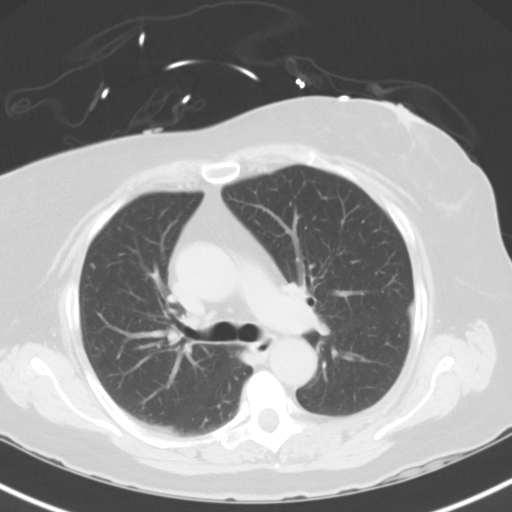
[im 43/52  lung]
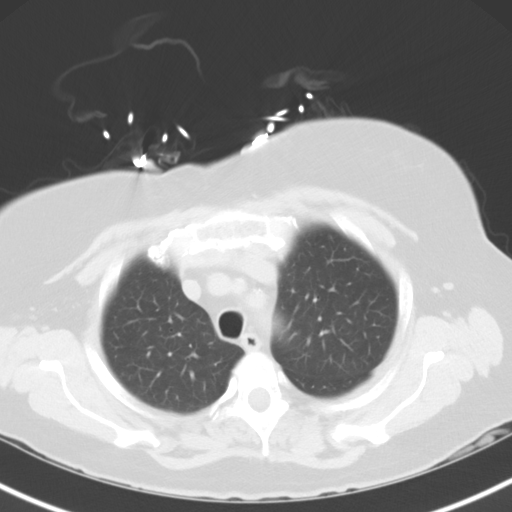

[Series 6: axial arterial · axial · arterial · 0.66mm/px · z∈[-774,-570]mm · 8 of 88 slices shown]
[im 10/88  lung]
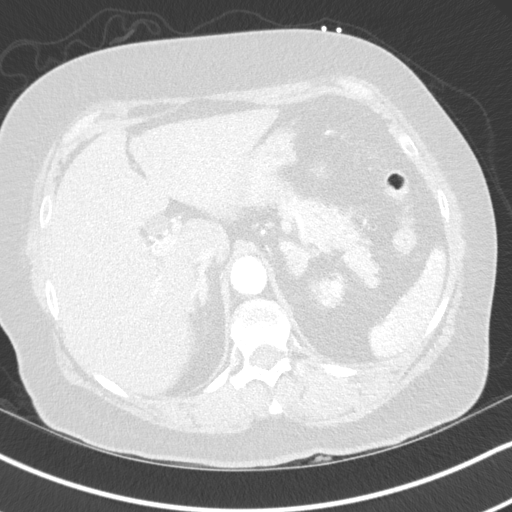
[im 20/88  soft-tissue]
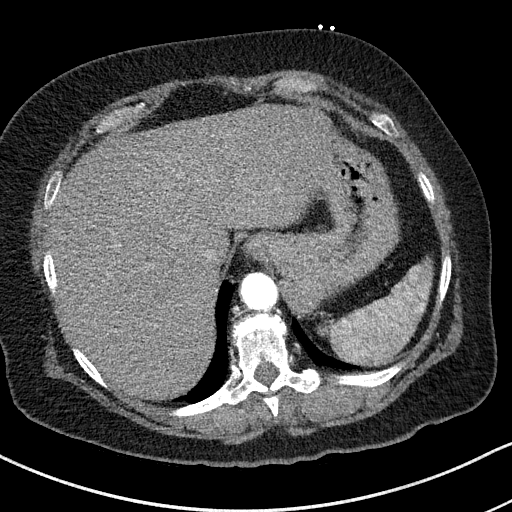
[im 30/88  lung]
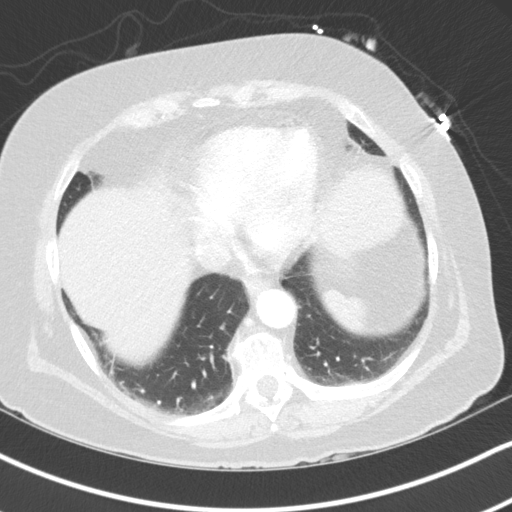
[im 39/88  soft-tissue]
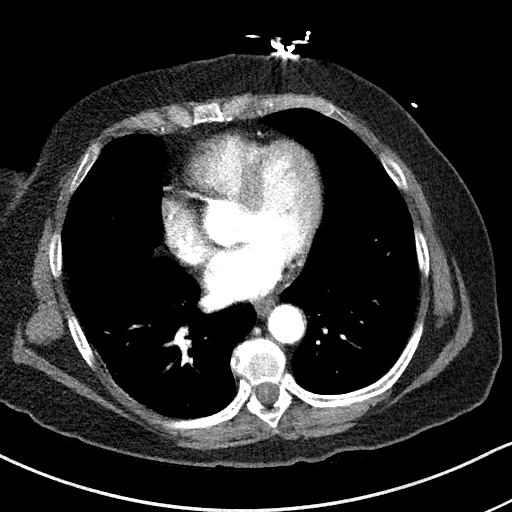
[im 49/88  lung]
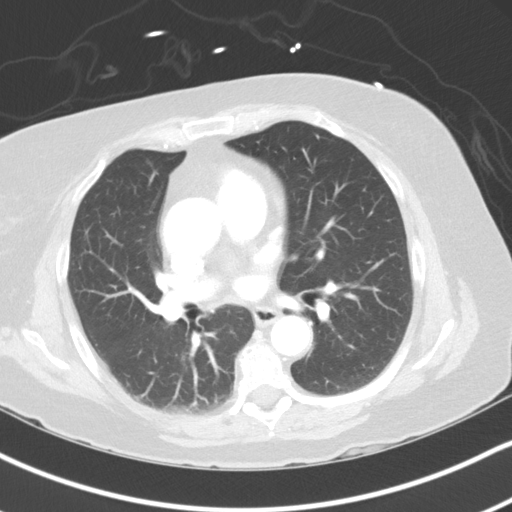
[im 59/88  soft-tissue]
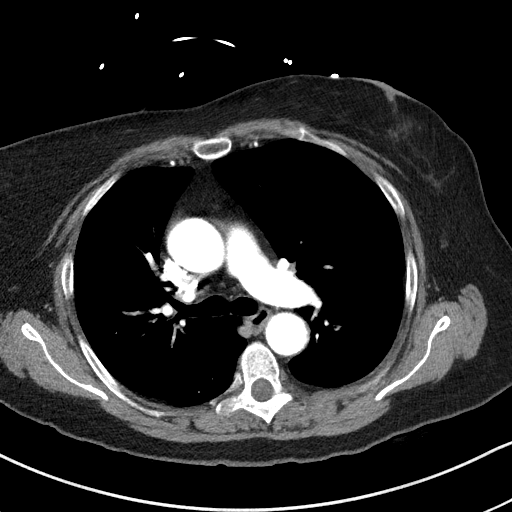
[im 68/88  lung]
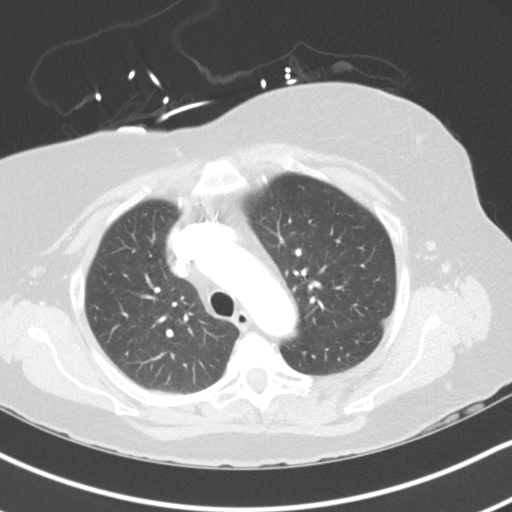
[im 78/88  soft-tissue]
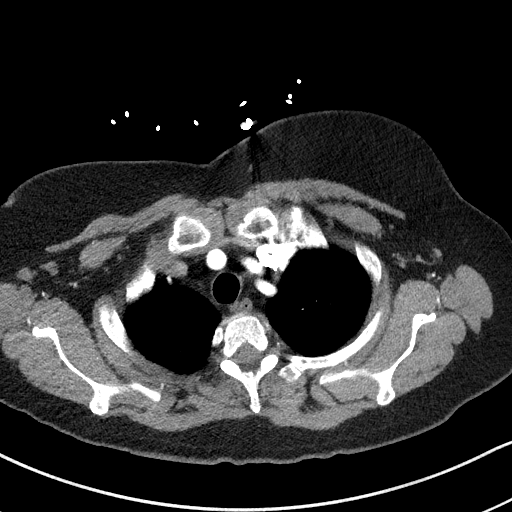

[Series 7: lung · axial · 0.66mm/px · 1 of 131 slices shown]
[im 10/131  soft-tissue]
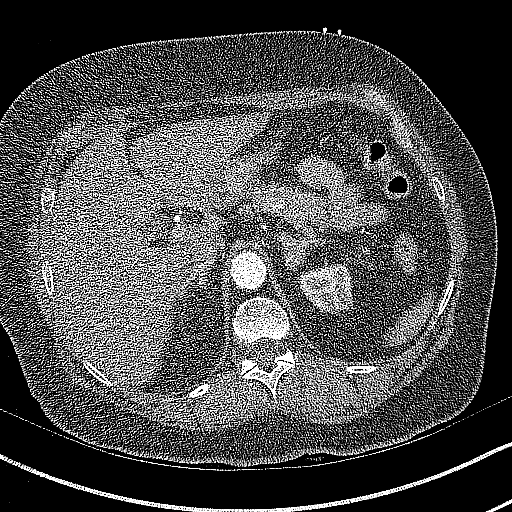

[Series 8: coronals · coronal · 0.55mm/px · 3 of 127 slices shown]
[im 32/127  soft-tissue]
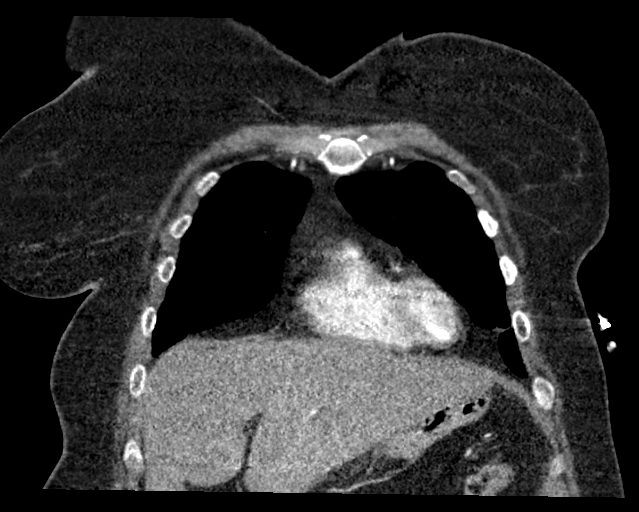
[im 64/127  soft-tissue]
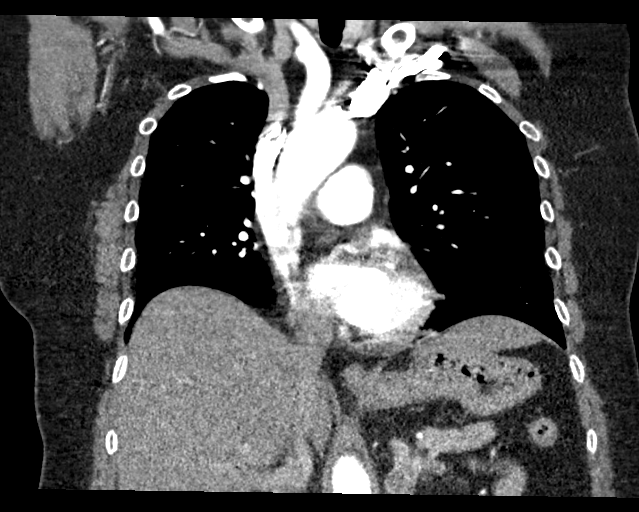
[im 95/127  soft-tissue]
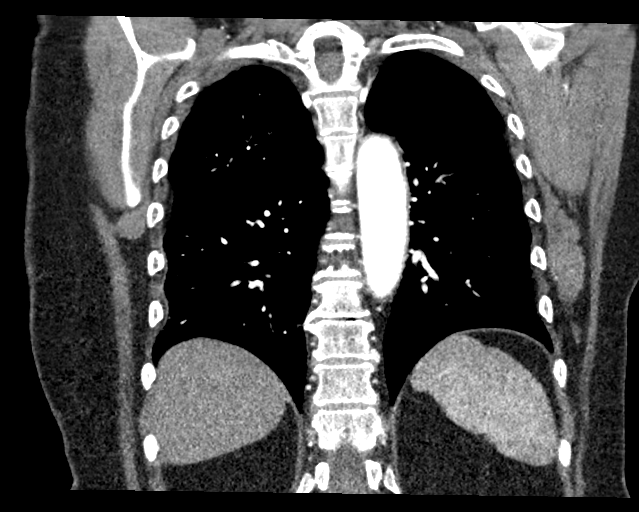

[17 of 46 positions shown; findings below may reference images not displayed]

FINDINGS: Cardiovascular: Heart size is normal. Atherosclerotic calcifications
are present within the coronary arteries.

No displaced calcifications are present at the aortic arch. CTA
demonstrates normal caliber of the aortic arch and descending
thoracic aorta with some atherosclerotic change. There is no
dissection. A 3 vessel arch configuration is present. There is some
calcification at the origins of the great vessels without stenosis
or vascular injury.

Mediastinum/Nodes: No significant mediastinal, hilar, or axillary
adenopathy is present. Thoracic inlet is within normal limits.

Lungs/Pleura: Previously noted posterior right pulmonary nodule has
increased in size, now measuring 8 mm. A 3 mm right upper lobe
nodule is present. Mild dependent atelectasis is present. The lungs
are otherwise clear. No significant pleural effusions are present.

Upper Abdomen: The patient is status post cholecystectomy. Left
adrenal adenoma is stable. No other solid organ lesions are present.
There is no significant adenopathy.

Musculoskeletal: Vertebral body heights and alignment are
maintained. Mild endplate changes are present. No focal lytic or
blastic lesions are present. The ribs are unremarkable.

Review of the MIP images confirms the above findings.
IMPRESSION: 1. No acute abnormality of the aorta or pulmonary arteries.
2. Atherosclerosis without aneurysm.
3. Increasing size of posterior right pulmonary nodule best seen on
image 70 of series 7. Non-contrast chest CT at 6-12 months is
recommended. If the nodule is stable at time of repeat CT, then
future CT at 18-24 months (from today's scan) is considered optional
for low-risk patients, but is recommended for high-risk patients.
This recommendation follows the consensus statement: Guidelines for
Management of Incidental Pulmonary Nodules Detected on CT Images:
4. No other acute or focal abnormality to explain the patient's
chest pain.

## 2020-07-19 IMAGING — DX PORTABLE CHEST - 1 VIEW
1 series · 1 of 1 positions shown · non-contrast
Comparison: Two-view chest x-ray 09/19/2018

CLINICAL DATA: Chest pain.  Shortness of breath.

EXAM:
PORTABLE CHEST 1 VIEW

[chest ap]
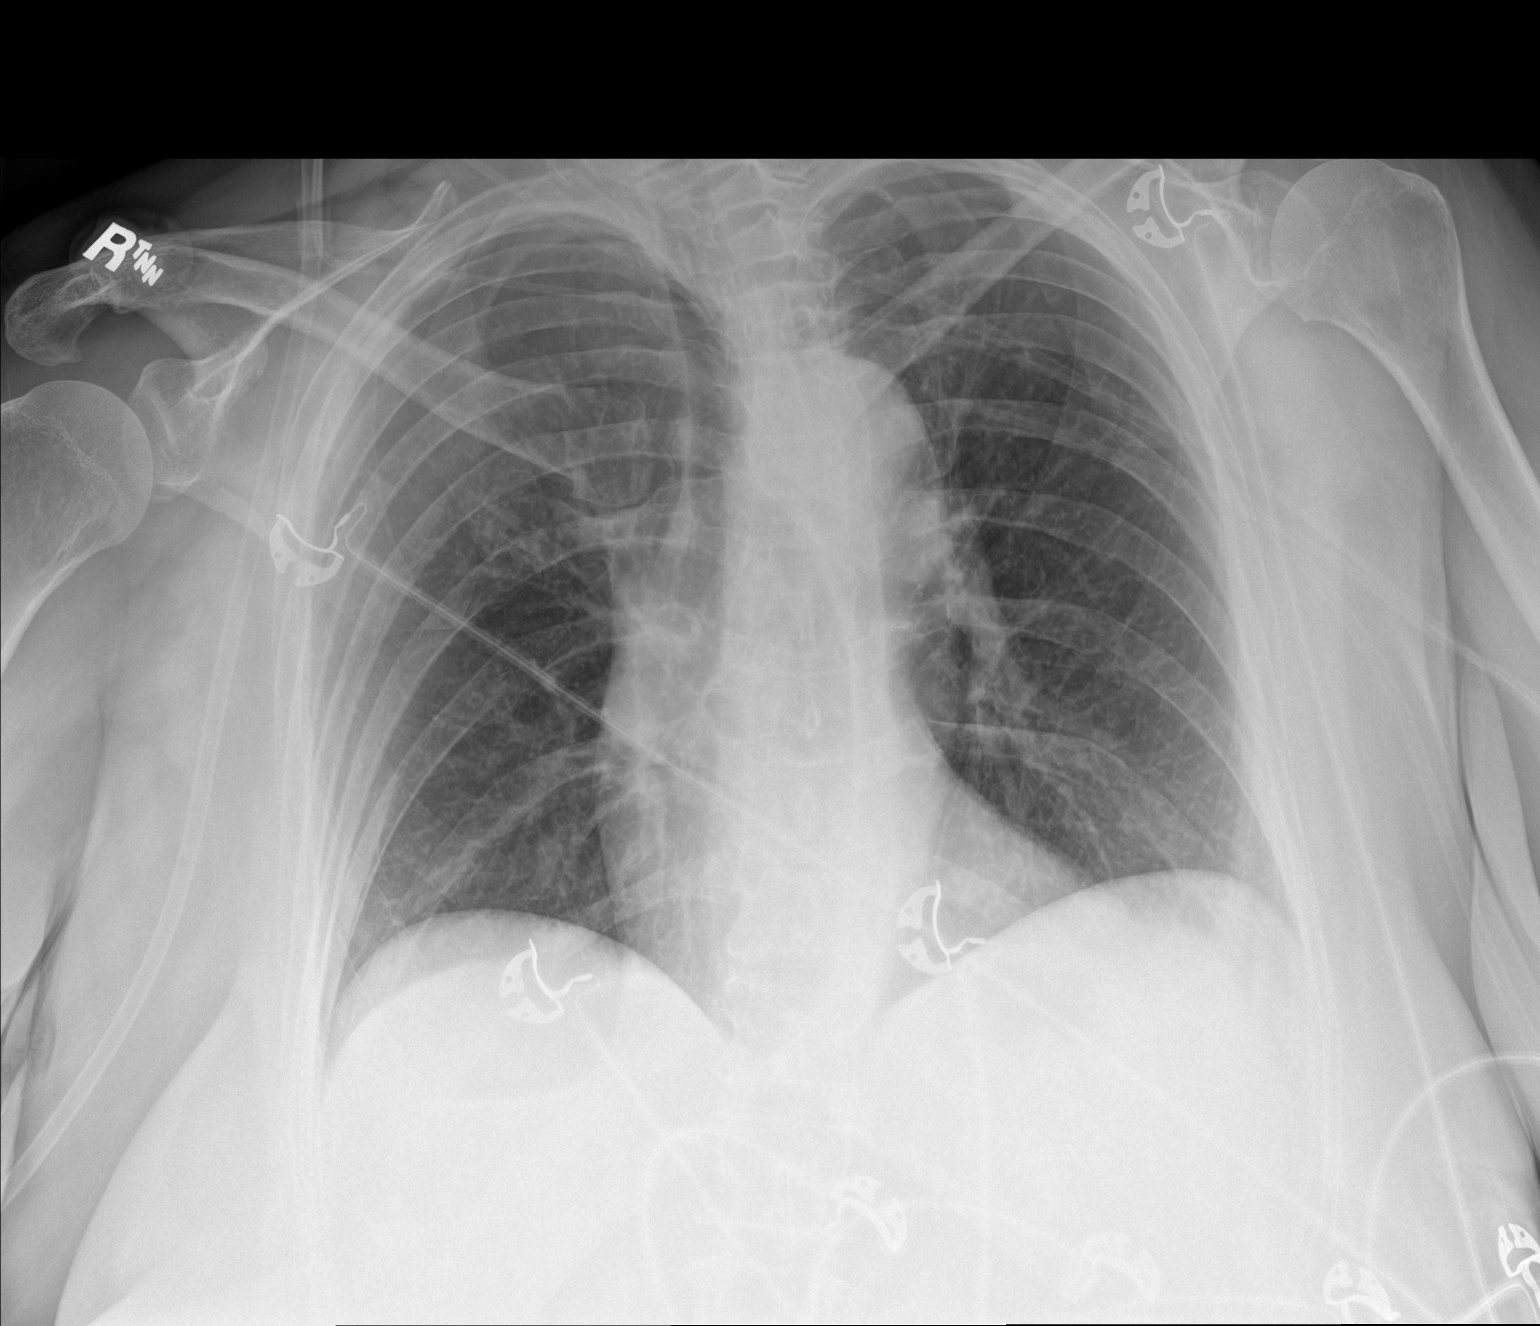

[1 of 1 positions shown; findings below may reference images not displayed]

FINDINGS: The heart size is normal. There is no edema or effusion. No focal
airspace disease is present. The visualized soft tissues and bony
thorax are unremarkable.
IMPRESSION: No acute cardiopulmonary disease.

## 2020-07-22 ENCOUNTER — Ambulatory Visit (INDEPENDENT_AMBULATORY_CARE_PROVIDER_SITE_OTHER): Payer: Medicare Other

## 2020-07-22 ENCOUNTER — Other Ambulatory Visit: Payer: Self-pay

## 2020-07-22 ENCOUNTER — Other Ambulatory Visit (INDEPENDENT_AMBULATORY_CARE_PROVIDER_SITE_OTHER): Payer: Self-pay | Admitting: Nurse Practitioner

## 2020-07-22 ENCOUNTER — Ambulatory Visit (INDEPENDENT_AMBULATORY_CARE_PROVIDER_SITE_OTHER): Payer: Medicare Other | Admitting: Nurse Practitioner

## 2020-07-22 ENCOUNTER — Encounter (INDEPENDENT_AMBULATORY_CARE_PROVIDER_SITE_OTHER): Payer: Self-pay | Admitting: Nurse Practitioner

## 2020-07-22 VITALS — BP 120/88 | HR 79 | Ht 62.0 in | Wt 243.0 lb

## 2020-07-22 DIAGNOSIS — M79672 Pain in left foot: Secondary | ICD-10-CM

## 2020-07-22 DIAGNOSIS — I998 Other disorder of circulatory system: Secondary | ICD-10-CM | POA: Diagnosis not present

## 2020-07-22 DIAGNOSIS — I10 Essential (primary) hypertension: Secondary | ICD-10-CM | POA: Diagnosis not present

## 2020-07-22 DIAGNOSIS — G629 Polyneuropathy, unspecified: Secondary | ICD-10-CM

## 2020-07-22 DIAGNOSIS — Z95820 Peripheral vascular angioplasty status with implants and grafts: Secondary | ICD-10-CM

## 2020-07-22 MED ORDER — PREGABALIN 75 MG PO CAPS: 75.0000 mg | ORAL_CAPSULE | Freq: Two times a day (BID) | ORAL | 0 refills | Status: DC

## 2020-07-22 MED ORDER — HYDROCODONE-ACETAMINOPHEN 5-325 MG PO TABS: 1.0000 | ORAL_TABLET | Freq: Four times a day (QID) | ORAL | 0 refills | Status: DC | PRN

## 2020-07-22 NOTE — Progress Notes (Signed)
Subjective:    Patient ID: Carla [name], female    DOB: [birth year], 59 y.o.   MRN: [id number] Chief Complaint  Patient presents with  . Follow-up    stat per Ouida Sills post sgy    The patient presents today as an emergent referral from her primary care physician Dr. Ouida Sills.  The patient is a previous patient of ours that we typically have seen for PAD and performed several interventions.  Initially the patient was scheduled to have an intervention performed on her right lower extremity by our office however the evening before her left lower extremity became ischemic and she subsequently presented to the emergency room.  Unfortunately the emergency room had an extended wait and the patient left and went to [facility].  The patient ultimately underwent extensive intervention there.  On 06/21/2020 she underwent bilateral iliac stent placement with left lower extremity fasciotomy with left posterior tibial and dorsalis pedis cutdown and right groin cutdown.  She subsequently presented back to [facility] emergency room on 919 for groin and lower extremity erythema.  The sutures and staples removed at [facility].  However the patient presented to her primary care physician with severe pain and throbbing and aching in her left lower extremity.  Today the patient's foot is warm and not acutely discolored.  Several visible scars in place that are not dehisced or showing evidence of infection.  Today noninvasive studies show an ABI of 1.13 on the right with a TBI 0.80.  The left has an ABI of 0.75 with a TBI 0.93.  Previous studies show an ABI 0.41 on the right ABI 1.11 on the left.  The patient has biphasic/triphasic tibial artery waveforms in the right with good toe waveforms.  Patient has biphasic/monophasic waveforms in the left with slightly dampened toe waveforms.     Review of Systems  Cardiovascular: Positive for leg swelling.  Skin: Positive for wound.  Neurological: Positive for numbness.  All  other systems reviewed and are negative.      Objective:   Physical Exam Vitals reviewed.  HENT:     Head: Normocephalic.  Cardiovascular:     Rate and Rhythm: Normal rate.     Pulses: Decreased pulses.  Pulmonary:     Effort: Pulmonary effort is normal.  Musculoskeletal:     Right lower leg: Edema present.  Skin:    General: Skin is warm and dry.  Neurological:     Mental Status: She is alert and oriented to person, place, and time.  Psychiatric:        Mood and Affect: Mood normal.        Behavior: Behavior normal.        Thought Content: Thought content normal.        Judgment: Judgment normal.     BP 120/88   Pulse 79   Ht 5\' 2"  (1.575 m)   Wt 243 lb (110.2 kg)   BMI 44.45 kg/m   Past Medical History:  Diagnosis Date  . Abdominal aortic atherosclerosis ([place])    a. 05/2017 CTA abd/pelvis: significant atherosclerotic dzs of the infrarenal abd Ao w/ some mural thrombus. No aneurysm or dissection.  . Abdominal pain   . Acute focal ischemia of small intestine (HCC)   . Acute right-sided low back pain with right-sided sciatica 06/13/2017  . AKI (acute kidney injury) ([place]) 07/29/2017  . Baker's cyst of knee, right    a. 07/2016 U/S: 4.1 x 1.4 x 2.9 cystic structure in R  poplitetal fossa.  . Bell's palsy   . Borderline diabetes   . Cancer ([place])    skin cancer on nose  . Chest pain    a. 08/2012 Lexiscan MV: EF 54%, non ischemia/infarct.  . CHF (congestive heart failure) ([place])   . Chronic idiopathic constipation   . Chronic mesenteric ischemia ([place])   . COPD (chronic obstructive pulmonary disease) ([place])   . Diastolic dysfunction    a. 05/2017 Echo: Ef 60-65%, no rwma, Gr1 DD, no source of cardiac emboli.  . 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 05/2017 CTA Abd/pelvis: apparent thrombus or embolus in prox SMA (70-90%); b. 05/2017 catheter directed tPA, mechanical thrombectomy, and stenting of the SMA.  . Essential hypertension with goal blood pressure less  than 140/90 01/19/2016  . Gastroesophageal reflux disease 01/19/2016  . GERD (gastroesophageal reflux disease)   . H/O colectomy   . History of kidney stones   . Hyperlipidemia   . Hypertension   . Hypotension 07/29/2017  . Morbid obesity with BMI of 40.0-44.9, adult ([place])   . Occlusive mesenteric ischemia ([place]) 06/19/2017  . Personal history of other malignant neoplasm of skin 06/01/2016  . Pre-diabetes   . Primary osteoarthritis of both knees 06/13/2017  . Recurrent major depressive disorder ([place]) 01/19/2016  . SBO (small bowel obstruction) ([place]) 08/06/2017  . 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 Urinary tract infection 01/09/2014    Social History   Socioeconomic History  . Marital status: Single    Spouse name: Not on file  . Number of children: Not on file  . Years of education: Not on file  . Highest education level: Not on file  Occupational History  . Occupation: Unemployed  Tobacco Use  . Smoking status: Current Some Day Smoker    Packs/day: 0.10    Types: Cigarettes  . Smokeless tobacco: Never Used  Vaping Use  . Vaping Use: Never used  Substance and Sexual Activity  . Alcohol use: No  . Drug use: No  . Sexual activity: Not on file  Other Topics Concern  . Not on file  Social History Narrative   Lives in [place] with boyfriend.  Does not routinely exercise.   Social Determinants of Health   Financial Resource Strain:   . Difficulty of Paying Living Expenses: Not on file  Food Insecurity:   . Worried About Charity fundraiser in the Last Year: Not on file  . Ran Out of Food in the Last Year: Not on file  Transportation Needs:   . Lack of Transportation (Medical): Not on file  . Lack of Transportation (Non-Medical): Not on file  Physical Activity:   . Days of Exercise per Week: Not on file  . Minutes of Exercise per Session: Not on file  Stress:   . Feeling of Stress : Not on file  Social Connections:   . Frequency of Communication with Friends  and Family: Not on file  . Frequency of Social Gatherings with Friends and Family: Not on file  . Attends Religious Services: Not on file  . Active Member of Clubs or Organizations: Not on file  . Attends Archivist Meetings: Not on file  . Marital Status: Not on file  Intimate Partner Violence:   . Fear of Current or Ex-Partner: Not on file  . Emotionally Abused: Not on file  . Physically Abused: Not on file  . Sexually Abused: Not on file    Past Surgical  History:  Procedure Laterality Date  . AMPUTATION TOE Left 12/09/2019   Procedure: AMPUTATION TOE MPJ left;  Surgeon: Caroline More, DPM;  Location: [facility] ORS;  Service: Podiatry;  Laterality: Left;  . APPENDECTOMY    . CHOLECYSTECTOMY    . LAPAROTOMY N/A 08/08/2017   Procedure: EXPLORATORY LAPAROTOMY POSSIBLE BOWEL RESECTION;  Surgeon: Jules Husbands, MD;  Location: [facility] ORS;  Service: General;  Laterality: N/A;  . LOWER EXTREMITY ANGIOGRAPHY Left 11/17/2019   Procedure: LOWER EXTREMITY ANGIOGRAPHY;  Surgeon: Algernon Huxley, MD;  Location: [place] [facility];  Service: Cardiovascular;  Laterality: Left;  . LOWER EXTREMITY INTERVENTION N/A 11/19/2019   Procedure: LOWER EXTREMITY INTERVENTION;  Surgeon: Algernon Huxley, MD;  Location: [place] [facility];  Service: Cardiovascular;  Laterality: N/A;  . TEE WITHOUT CARDIOVERSION N/A 06/22/2017   Procedure: TRANSESOPHAGEAL ECHOCARDIOGRAM (TEE);  Surgeon: Wellington Hampshire, MD;  Location: [facility] ORS;  Service: Cardiovascular;  Laterality: N/A;  . TOTAL KNEE ARTHROPLASTY Right 07/11/2018   Procedure: TOTAL KNEE ARTHROPLASTY;  Surgeon: Corky Mull, MD;  Location: [facility] OR[facility];  Service: Orthopedics;  Laterality: Right;  [name] VAGINAL HYSTERECTOMY    . VISCERAL ARTERY INTERVENTION N/A 06/20/2017   Procedure: Visceral Artery Intervention, possible aortic thrombectomy;  Surgeon: Algernon Huxley, MD;  Location: [place] [facility];  Service: Cardiovascular;  Laterality: N/A;  . VISCERAL ARTERY  INTERVENTION N/A 01/28/2018   Procedure: VISCERAL ARTERY INTERVENTION;  Surgeon: Algernon Huxley, MD;  Location: [place] [facility];  Service: Cardiovascular;  Laterality: N/A;    Family History  Problem Relation Age of Onset  . Hypertension Mother   . Heart disease Mother   . Heart attack Mother   . Breast cancer Mother   . Hypertension Father   . Breast cancer Paternal Aunt     Allergies  Allergen Reactions  . Gabapentin     Tremors   . Bactrim [Sulfamethoxazole-Trimethoprim] Itching       Assessment & Plan:   1. Ischemia of left lower extremity The patient had extensive intervention done at [facility].  Currently noninvasive studies show she has adequate perfusion.  Her wounds show no signs or symptoms of dehiscence or infection.  Due to the fact that the surgery was done at [facility] the patient is encouraged to follow with [facility] however as she was a previous patient prior to his intervention she prefers to remain with our practice.  We will have the patient return in 3 months for noninvasive studies or sooner if issues should arise.  2. Neuropathy The pain that the patient is likely describing is related to neuropathy following an ischemic foot.  Her noninvasive studies today indicate that she does have adequate perfusion and there is no emergent situation at this time.  The patient has several adjuncts to neuropathy been taking now including Topamax as well as Celexa.  She was previously prescribed gabapentin however she notes that it gives her tremors.  She has never tried Lyrica.  We will start the patient on Lyrica as well as submit a referral back to her neurologist for further work-up.  We also discussed that the patient may ultimately need to be referred to pain management if some of these tactics are unsuccessful in controlling her pain.  3. Essential hypertension with goal blood pressure less than 140/90 Continue antihypertensive medications as already ordered, these medications have been  reviewed and there are no changes at this time.    Current Outpatient Medications on File Prior to  Visit  Medication Sig Dispense Refill  . albuterol (VENTOLIN HFA) 108 (90 Base) MCG/ACT inhaler Inhale 2 puffs into the lungs every 6 (six) hours as needed for wheezing or shortness of breath.     [name] amitriptyline (ELAVIL) 25 MG tablet Take 25 mg by mouth at bedtime.    [name] amLODipine (NORVASC) 5 MG tablet Take 1 tablet (5 mg total) by mouth daily. 30 tablet 2  . amoxicillin-clavulanate (AUGMENTIN) 875-125 MG tablet Take by mouth.    [name] apixaban (ELIQUIS) 5 MG TABS tablet Take 5 mg by mouth 2 (two) times daily.    [name] atorvastatin (LIPITOR) 80 MG tablet Take 80 mg by mouth at bedtime.     . clotrimazole-betamethasone (LOTRISONE) cream Apply 1 application topically 2 (two) times daily as needed (for dry skin).     . DULoxetine (CYMBALTA) 60 MG capsule Take 60 mg by mouth daily.    . ferrous sulfate 325 (65 FE) MG tablet Take 325 mg by mouth daily.    . Ipratropium-Albuterol (COMBIVENT) 20-100 MCG/ACT AERS respimat Inhale into the lungs.    . metoprolol tartrate (LOPRESSOR) 50 MG tablet Take 1 tablet (50 mg total) by mouth 2 (two) times daily. 30 tablet 0  . Multiple Vitamin (MULTIVITAMIN WITH MINERALS) TABS tablet Take 1 tablet by mouth daily. 90 tablet 3  . omeprazole (PRILOSEC) 20 MG capsule Take 20 mg by mouth 2 (two) times daily before a meal.    . ondansetron (ZOFRAN) 4 MG tablet TAKE 1 TABLET BY MOUTH EVERY 8 HOURS AS NEEDED FOR UP TO 7 DAYS FOR NAUSEA OR VOMITING    . oxybutynin (DITROPAN-XL) 10 MG 24 hr tablet Take 10 mg by mouth daily.    [name] topiramate (TOPAMAX) 50 MG tablet Take by mouth.    [name] buPROPion (WELLBUTRIN XL) 150 MG 24 hr tablet Take 150 mg by mouth daily.     [name] topiramate (TOPAMAX) 25 MG tablet Take 25 mg by mouth 2 (two) times daily.  (Patient not taking: Reported on 06/10/2020)     No current facility-administered medications on file prior to visit.    There are no Patient  Instructions on file for this visit. No follow-ups on file.   Kris Hartmann, NP

## 2020-08-09 ENCOUNTER — Telehealth (INDEPENDENT_AMBULATORY_CARE_PROVIDER_SITE_OTHER): Payer: Self-pay

## 2020-08-09 NOTE — Telephone Encounter (Signed)
Can you please schedule this patient per Carla Ditch NP? See notes below. Thank you

## 2020-08-09 NOTE — Telephone Encounter (Signed)
Patient called stating she is in unbearable pain on her left leg that has been going on for the past 4 days. Patient also stated she was told she has neuropathy and was given Lyrica. Patient stated she does not  have any more pain medication and wants to be seen. Patient was last seen on 07/22/20 with an ultrasound. Patient had a surgery at [facility].

## 2020-08-09 NOTE — Telephone Encounter (Signed)
Patient should come in with ABIs to see me or dew

## 2020-08-18 ENCOUNTER — Ambulatory Visit (INDEPENDENT_AMBULATORY_CARE_PROVIDER_SITE_OTHER): Payer: Medicare Other | Admitting: Surgery

## 2020-08-18 ENCOUNTER — Other Ambulatory Visit (INDEPENDENT_AMBULATORY_CARE_PROVIDER_SITE_OTHER): Payer: Self-pay | Admitting: Nurse Practitioner

## 2020-08-18 ENCOUNTER — Encounter: Payer: Self-pay | Admitting: Surgery

## 2020-08-18 ENCOUNTER — Other Ambulatory Visit: Payer: Self-pay

## 2020-08-18 VITALS — BP 124/84 | HR 89 | Temp 98.1°F | Ht 62.0 in | Wt 250.0 lb

## 2020-08-18 DIAGNOSIS — I70222 Atherosclerosis of native arteries of extremities with rest pain, left leg: Secondary | ICD-10-CM

## 2020-08-18 DIAGNOSIS — I739 Peripheral vascular disease, unspecified: Secondary | ICD-10-CM

## 2020-08-18 DIAGNOSIS — M79605 Pain in left leg: Secondary | ICD-10-CM

## 2020-08-18 DIAGNOSIS — K439 Ventral hernia without obstruction or gangrene: Secondary | ICD-10-CM | POA: Diagnosis not present

## 2020-08-18 NOTE — Patient Instructions (Addendum)
Continue to work on quitting smoking. Follow up with Bariatrics regarding weight loss surgery.   Follow up here in 6-8 months. We will send you a letter about this appointment.

## 2020-08-19 ENCOUNTER — Ambulatory Visit (INDEPENDENT_AMBULATORY_CARE_PROVIDER_SITE_OTHER): Payer: Medicare Other | Admitting: Nurse Practitioner

## 2020-08-19 ENCOUNTER — Encounter (INDEPENDENT_AMBULATORY_CARE_PROVIDER_SITE_OTHER): Payer: Medicare Other

## 2020-08-19 DIAGNOSIS — M792 Neuralgia and neuritis, unspecified: Secondary | ICD-10-CM | POA: Insufficient documentation

## 2020-08-20 ENCOUNTER — Ambulatory Visit (INDEPENDENT_AMBULATORY_CARE_PROVIDER_SITE_OTHER): Payer: Medicare Other

## 2020-08-20 ENCOUNTER — Other Ambulatory Visit: Payer: Self-pay

## 2020-08-20 ENCOUNTER — Encounter (INDEPENDENT_AMBULATORY_CARE_PROVIDER_SITE_OTHER): Payer: Self-pay | Admitting: Nurse Practitioner

## 2020-08-20 ENCOUNTER — Ambulatory Visit (INDEPENDENT_AMBULATORY_CARE_PROVIDER_SITE_OTHER): Payer: Medicare Other | Admitting: Nurse Practitioner

## 2020-08-20 VITALS — BP 136/74 | HR 76 | Ht 62.0 in | Wt 252.0 lb

## 2020-08-20 DIAGNOSIS — I739 Peripheral vascular disease, unspecified: Secondary | ICD-10-CM

## 2020-08-20 DIAGNOSIS — M79605 Pain in left leg: Secondary | ICD-10-CM

## 2020-08-20 DIAGNOSIS — F17200 Nicotine dependence, unspecified, uncomplicated: Secondary | ICD-10-CM

## 2020-08-20 DIAGNOSIS — I10 Essential (primary) hypertension: Secondary | ICD-10-CM

## 2020-08-20 DIAGNOSIS — I70222 Atherosclerosis of native arteries of extremities with rest pain, left leg: Secondary | ICD-10-CM

## 2020-08-20 DIAGNOSIS — E785 Hyperlipidemia, unspecified: Secondary | ICD-10-CM

## 2020-08-20 MED ORDER — TRAMADOL HCL 50 MG PO TABS: 50.0000 mg | ORAL_TABLET | Freq: Four times a day (QID) | ORAL | 0 refills | Status: DC | PRN

## 2020-08-20 NOTE — Progress Notes (Signed)
Outpatient Surgical Follow Up  08/20/2020  Carla [name] is an 59 y.o. female.   Chief Complaint  Patient presents with  . Follow-up    ventral hernia    HPI: Carla [name] is a 59 year old female following up for the massive ventral hernia with loss of domain.  She had significant medical issues to include coronary artery disease, PVD, obesity continues tobacco abuse and COPD.  Most recently needed left lower extremity revascularization with multiple stents.  She still has some significant chronic pain both in the abdomen and left leg.  She is on antiplatelet therapy and anticoagulation.  He actually has gained 5 pounds since my last visit Recommended bariatric referral before elective abdominal wall reconstruction.  She completed some of the visit but they encouraged her to stop smoking which she has not done so. She continues to have chronic abdominal pain but no evidence of incarceration or strangulation.  He has had a recent lower extremity ultrasound that I have personally reviewed showing no evidence of DVT.  Past Medical History:  Diagnosis Date  . Abdominal aortic atherosclerosis ([place])    a. 05/2017 CTA abd/pelvis: significant atherosclerotic dzs of the infrarenal abd Ao w/ some mural thrombus. No aneurysm or dissection.  . Abdominal pain   . Acute focal ischemia of small intestine (HCC)   . Acute right-sided low back pain with right-sided sciatica 06/13/2017  . AKI (acute kidney injury) ([place]) 07/29/2017  . Baker's cyst of knee, right    a. 07/2016 U/S: 4.1 x 1.4 x 2.9 cystic structure in R poplitetal fossa.  . Bell's palsy   . Borderline diabetes   . Cancer ([place])    skin cancer on nose  . Chest pain    a. 08/2012 Lexiscan MV: EF 54%, non ischemia/infarct.  . CHF (congestive heart failure) ([place])   . Chronic idiopathic constipation   . Chronic mesenteric ischemia ([place])   . COPD (chronic obstructive pulmonary disease) ([place])   . Diastolic dysfunction    a. 05/2017 Echo: Ef 60-65%,  no rwma, Gr1 DD, no source of cardiac emboli.  . 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[place])    a. 05/2017 CTA Abd/pelvis: apparent thrombus or embolus in prox SMA (70-90%); b. 05/2017 catheter directed tPA, mechanical thrombectomy, and stenting of the SMA.  . Essential hypertension with goal blood pressure less than 140/90 01/19/2016  . Gastroesophageal reflux disease 01/19/2016  . GERD (gastroesophageal reflux disease)   . H/O colectomy   . History of kidney stones   . Hyperlipidemia   . Hypertension   . Hypotension 07/29/2017  . Morbid obesity with BMI of 40.0-44.9, adult ([place])   . Occlusive mesenteric ischemia ([place]) 06/19/2017  . Personal history of other malignant neoplasm of skin 06/01/2016  . Pre-diabetes   . Primary osteoarthritis of both knees 06/13/2017  . Recurrent major depressive disorder ([place]) 01/19/2016  . SBO (small bowel obstruction) ([place]) 08/06/2017  . 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 Urinary tract infection 01/09/2014    Past Surgical History:  Procedure Laterality Date  . AMPUTATION TOE Left 12/09/2019   Procedure: AMPUTATION TOE MPJ left;  Surgeon: Caroline More, DPM;  Location: [facility];  Service: Podiatry;  Laterality: Left;  . APPENDECTOMY    . CHOLECYSTECTOMY    . LAPAROTOMY N/A 08/08/2017   Procedure: EXPLORATORY LAPAROTOMY POSSIBLE BOWEL RESECTION;  Surgeon: Jules Husbands, MD;  Location: [facility];  Service: General;  Laterality: N/A;  . LOWER EXTREMITY ANGIOGRAPHY Left 11/17/2019   Procedure:  LOWER EXTREMITY ANGIOGRAPHY;  Surgeon: Algernon Huxley, MD;  Location: [place] [facility];  Service: Cardiovascular;  Laterality: Left;  . LOWER EXTREMITY INTERVENTION N/A 11/19/2019   Procedure: LOWER EXTREMITY INTERVENTION;  Surgeon: Algernon Huxley, MD;  Location: [place] [facility];  Service: Cardiovascular;  Laterality: N/A;  . TEE WITHOUT CARDIOVERSION N/A 06/22/2017   Procedure: TRANSESOPHAGEAL ECHOCARDIOGRAM (TEE);  Surgeon: Wellington Hampshire, MD;  Location:  [facility] OR[facility];  Service: Cardiovascular;  Laterality: N/A;  . TOTAL KNEE ARTHROPLASTY Right 07/11/2018   Procedure: TOTAL KNEE ARTHROPLASTY;  Surgeon: Corky Mull, MD;  Location: [facility] OR[facility];  Service: Orthopedics;  Laterality: Right;  [name] VAGINAL HYSTERECTOMY    . VISCERAL ARTERY INTERVENTION N/A 06/20/2017   Procedure: Visceral Artery Intervention, possible aortic thrombectomy;  Surgeon: Algernon Huxley, MD;  Location: [place] [facility];  Service: Cardiovascular;  Laterality: N/A;  . VISCERAL ARTERY INTERVENTION N/A 01/28/2018   Procedure: VISCERAL ARTERY INTERVENTION;  Surgeon: Algernon Huxley, MD;  Location: [place] [facility];  Service: Cardiovascular;  Laterality: N/A;    Family History  Problem Relation Age of Onset  . Hypertension Mother   . Heart disease Mother   . Heart attack Mother   . Breast cancer Mother   . Hypertension Father   . Breast cancer Paternal Aunt     Social History:  reports that she has been smoking cigarettes. She has been smoking about 0.10 packs per day. She has never used smokeless tobacco. She reports that she does not drink alcohol and does not use drugs.  Allergies:  Allergies  Allergen Reactions  . Gabapentin     Tremors   . Bactrim [Sulfamethoxazole-Trimethoprim] Itching    Medications reviewed.    ROS Full ROS performed and is otherwise negative other than what is stated in HPI   BP 124/84   Pulse 89   Temp 98.1 F (36.7 C)   Ht 5\' 2"  (1.575 m)   Wt 250 lb (113.4 kg)   SpO2 97%   BMI 45.73 kg/m   Physical Exam Vitals and nursing note reviewed. Exam conducted with a chaperone present.  Constitutional:      General: She is not in acute distress.    Appearance: She is obese.  Eyes:     General: No scleral icterus.       Right eye: No discharge.        Left eye: No discharge.  Cardiovascular:     Rate and Rhythm: Normal rate and regular rhythm.  Pulmonary:     Effort: Pulmonary effort is normal.     Breath sounds: Normal breath sounds.   Abdominal:     General: Abdomen is flat. There is no distension.     Palpations: Abdomen is soft. There is no mass.     Tenderness: There is no abdominal tenderness. There is no guarding or rebound.     Hernia: A hernia is present.     Comments: ventral hernia w loss of domain, reducible.  Musculoskeletal:        General: Swelling present. No tenderness. Normal range of motion.     Cervical back: Normal range of motion and neck supple. No rigidity or tenderness.     Right lower leg: Edema present.     Left lower leg: Edema present.  Lymphadenopathy:     Cervical: No cervical adenopathy.  Skin:    General: Skin is warm and dry.     Capillary Refill: Capillary refill takes less  than 2 seconds.  Neurological:     General: No focal deficit present.     Mental Status: She is alert and oriented to person, place, and time.  Psychiatric:        Mood and Affect: Mood normal.        Behavior: Behavior normal.        Thought Content: Thought content normal.        Judgment: Judgment normal.     Assessment/Plan: Patient is a 59 year old female with symptomatic ventral hernia.  She has significant comorbidities including severe peripheral vascular disease, coronary artery disease and morbid obesity.  Her BMI is 45. Cussed with the patient in detail about contraindication for ventral hernia repair in the setting of actively smoking, recent revascularization and BMI of 45.  Encourage her to modify risk factors before elective procedure.  I will be happy to see her in 6 months to 1 year and revisit her situation once again.  There is no need for any emergent surgical intervention at this time Greater than 50% of the 45 minutes  visit was spent in counseling/coordination of care   Caroleen Hamman, MD [place] Surgeon

## 2020-08-22 ENCOUNTER — Encounter (INDEPENDENT_AMBULATORY_CARE_PROVIDER_SITE_OTHER): Payer: Self-pay | Admitting: Nurse Practitioner

## 2020-08-22 NOTE — Progress Notes (Signed)
Subjective:    Patient ID: Carla [name], female    DOB: 1961-07-13, 59 y.o.   MRN: [id number] Chief Complaint  Patient presents with  . Follow-up    69mo U/S    The patient presents today for evaluation of pain in her left lower extremity.  The patient notes that she has a severe stabbing pain in this lower extremity.  This pain has had been in place ever since she had her intervention done at [facility].  During this time the patient underwent a fasciotomy for compartment syndrome.  Unfortunately as a result of this the patient has severe neuropathy.  Previously she was placed on Lyrica however this has not been helpful for her pain.  The patient notes only tramadol has been helpful in treating her pain.  She denies any claudication-like symptoms or rest pain.  She denies any discoloration of her lower extremities.  Today noninvasive studies show an ABI 1.02 on the right and 1.06 on the left.  This is relatively improved from previous studies.  The right ABI was 1.13 with a left ABI 0.75.  The patient has triphasic tibial artery waveforms in the right lower extremity with biphasic waveforms in the left lower extremity the patient has good toe waveforms bilaterally.   Review of Systems  Musculoskeletal: Positive for myalgias.  Neurological: Positive for weakness.  Hematological: Bruises/bleeds easily.  All other systems reviewed and are negative.      Objective:   Physical Exam Vitals reviewed.  HENT:     Head: Normocephalic.  Cardiovascular:     Rate and Rhythm: Normal rate.     Pulses: Normal pulses.  Pulmonary:     Effort: Pulmonary effort is normal.  Skin:    General: Skin is warm and dry.  Neurological:     Mental Status: She is alert and oriented to person, place, and time.  Psychiatric:        Mood and Affect: Mood normal.        Behavior: Behavior normal.        Thought Content: Thought content normal.        Judgment: Judgment normal.     BP 136/74   Pulse 76   Ht 5'  2" (1.575 m)   Wt 252 lb (114.3 kg)   BMI 46.09 kg/m   Past Medical History:  Diagnosis Date  . Abdominal aortic atherosclerosis ([place])    a. 05/2017 CTA abd/pelvis: significant atherosclerotic dzs of the infrarenal abd Ao w/ some mural thrombus. No aneurysm or dissection.  . Abdominal pain   . Acute focal ischemia of small intestine (HCC)   . Acute right-sided low back pain with right-sided sciatica 06/13/2017  . AKI (acute kidney injury) ([place]) 07/29/2017  . Baker's cyst of knee, right    a. 07/2016 U/S: 4.1 x 1.4 x 2.9 cystic structure in R poplitetal fossa.  . Bell's palsy   . Borderline diabetes   . Cancer ([place])    skin cancer on nose  . Chest pain    a. 08/2012 Lexiscan MV: EF 54%, non ischemia/infarct.  . CHF (congestive heart failure) ([place])   . Chronic idiopathic constipation   . Chronic mesenteric ischemia ([place])   . COPD (chronic obstructive pulmonary disease) ([place])   . Diastolic dysfunction    a. 05/2017 Echo: Ef 60-65%, no rwma, Gr1 DD, no source of cardiac emboli.  . 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[place])    a. 05/2017 CTA Abd/pelvis: apparent thrombus or embolus  in prox SMA (70-90%); b. 05/2017 catheter directed tPA, mechanical thrombectomy, and stenting of the SMA.  . Essential hypertension with goal blood pressure less than 140/90 01/19/2016  . Gastroesophageal reflux disease 01/19/2016  . GERD (gastroesophageal reflux disease)   . H/O colectomy   . History of kidney stones   . Hyperlipidemia   . Hypertension   . Hypotension 07/29/2017  . Morbid obesity with BMI of 40.0-44.9, adult ([place])   . Occlusive mesenteric ischemia ([place]) 06/19/2017  . Personal history of other malignant neoplasm of skin 06/01/2016  . Pre-diabetes   . Primary osteoarthritis of both knees 06/13/2017  . Recurrent major depressive disorder ([place]) 01/19/2016  . SBO (small bowel obstruction) ([place]) 08/06/2017  . 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 Urinary tract infection 01/09/2014     Social History   Socioeconomic History  . Marital status: Single    Spouse name: Not on file  . Number of children: Not on file  . Years of education: Not on file  . Highest education level: Not on file  Occupational History  . Occupation: Unemployed  Tobacco Use  . Smoking status: Current Some Day Smoker    Packs/day: 0.10    Types: Cigarettes  . Smokeless tobacco: Never Used  Vaping Use  . Vaping Use: Never used  Substance and Sexual Activity  . Alcohol use: No  . Drug use: No  . Sexual activity: Not on file  Other Topics Concern  . Not on file  Social History Narrative   Lives in [place] with boyfriend.  Does not routinely exercise.   Social Determinants of Health   Financial Resource Strain:   . Difficulty of Paying Living Expenses: Not on file  Food Insecurity:   . Worried About Charity fundraiser in the Last Year: Not on file  . Ran Out of Food in the Last Year: Not on file  Transportation Needs:   . Lack of Transportation (Medical): Not on file  . Lack of Transportation (Non-Medical): Not on file  Physical Activity:   . Days of Exercise per Week: Not on file  . Minutes of Exercise per Session: Not on file  Stress:   . Feeling of Stress : Not on file  Social Connections:   . Frequency of Communication with Friends and Family: Not on file  . Frequency of Social Gatherings with Friends and Family: Not on file  . Attends Religious Services: Not on file  . Active Member of Clubs or Organizations: Not on file  . Attends Archivist Meetings: Not on file  . Marital Status: Not on file  Intimate Partner Violence:   . Fear of Current or Ex-Partner: Not on file  . Emotionally Abused: Not on file  . Physically Abused: Not on file  . Sexually Abused: Not on file    Past Surgical History:  Procedure Laterality Date  . AMPUTATION TOE Left 12/09/2019   Procedure: AMPUTATION TOE MPJ left;  Surgeon: Caroline More, DPM;  Location: [facility] ORS;  Service:  Podiatry;  Laterality: Left;  . APPENDECTOMY    . CHOLECYSTECTOMY    . LAPAROTOMY N/A 08/08/2017   Procedure: EXPLORATORY LAPAROTOMY POSSIBLE BOWEL RESECTION;  Surgeon: Jules Husbands, MD;  Location: [facility] ORS;  Service: General;  Laterality: N/A;  . LOWER EXTREMITY ANGIOGRAPHY Left 11/17/2019   Procedure: LOWER EXTREMITY ANGIOGRAPHY;  Surgeon: Algernon Huxley, MD;  Location: [place] [facility];  Service: Cardiovascular;  Laterality: Left;  . LOWER EXTREMITY INTERVENTION  N/A 11/19/2019   Procedure: LOWER EXTREMITY INTERVENTION;  Surgeon: Algernon Huxley, MD;  Location: [place] [facility];  Service: Cardiovascular;  Laterality: N/A;  . TEE WITHOUT CARDIOVERSION N/A 06/22/2017   Procedure: TRANSESOPHAGEAL ECHOCARDIOGRAM (TEE);  Surgeon: Wellington Hampshire, MD;  Location: [facility];  Service: Cardiovascular;  Laterality: N/A;  . TOTAL KNEE ARTHROPLASTY Right 07/11/2018   Procedure: TOTAL KNEE ARTHROPLASTY;  Surgeon: Corky Mull, MD;  Location: [facility];  Service: Orthopedics;  Laterality: Right;  [name] VAGINAL HYSTERECTOMY    . VISCERAL ARTERY INTERVENTION N/A 06/20/2017   Procedure: Visceral Artery Intervention, possible aortic thrombectomy;  Surgeon: Algernon Huxley, MD;  Location: [place] [facility];  Service: Cardiovascular;  Laterality: N/A;  . VISCERAL ARTERY INTERVENTION N/A 01/28/2018   Procedure: VISCERAL ARTERY INTERVENTION;  Surgeon: Algernon Huxley, MD;  Location: [place] [facility];  Service: Cardiovascular;  Laterality: N/A;    Family History  Problem Relation Age of Onset  . Hypertension Mother   . Heart disease Mother   . Heart attack Mother   . Breast cancer Mother   . Hypertension Father   . Breast cancer Paternal Aunt     Allergies  Allergen Reactions  . Gabapentin     Tremors   . Bactrim [Sulfamethoxazole-Trimethoprim] Itching    CBC Latest Ref Rng & Units 11/23/2019 11/21/2019 11/20/2019  WBC 4.0 - 10.5 K/uL 6.0 5.4 5.8  Hemoglobin 12.0 - 15.0 g/dL 10.9(L) 10.8(L) 10.8(L)   Hematocrit 36 - 46 % 34.8(L) 34.3(L) 34.7(L)  Platelets 150 - 400 K/uL 288 210 174      CMP     Component Value Date/Time   NA 139 11/23/2019 1330   NA 140 02/12/2015 1137   K 3.9 11/23/2019 1330   K 2.9 (L) 02/12/2015 1137   CL 108 11/23/2019 1330   CL 107 02/12/2015 1137   CO2 21 (L) 11/23/2019 1330   CO2 27 02/12/2015 1137   GLUCOSE 118 (H) 11/23/2019 1330   GLUCOSE 120 (H) 02/12/2015 1137   BUN 10 11/23/2019 1330   BUN 15 02/12/2015 1137   CREATININE 0.83 11/23/2019 1330   CREATININE 0.78 02/12/2015 1137   CALCIUM 8.1 (L) 11/23/2019 1330   CALCIUM 9.0 02/12/2015 1137   PROT 6.2 (L) 11/23/2019 1330   PROT 7.1 01/19/2015 1112   ALBUMIN 3.1 (L) 11/23/2019 1330   ALBUMIN 3.8 01/19/2015 1112   AST 18 11/23/2019 1330   AST 17 01/19/2015 1112   ALT 16 11/23/2019 1330   ALT 11 (L) 01/19/2015 1112   ALKPHOS 105 11/23/2019 1330   ALKPHOS 84 01/19/2015 1112   BILITOT 0.3 11/23/2019 1330   BILITOT 0.5 01/19/2015 1112   GFRNONAA >60 11/23/2019 1330   GFRNONAA >60 02/12/2015 1137   GFRAA >60 11/23/2019 1330   GFRAA >60 02/12/2015 1137     @COAG @  Radiology     Assessment & Plan:   1. Left leg pain I had a long discussion with the patient regarding her left lower extremity leg pain.  Based upon the patient's description of pain as well as her previous compartment syndrome while she was at [facility], the patient likely has neuropathy.  The patient is already on a number of medications used to treat neuropathy at her previous visit we attempted Lyrica to see if this would improve her pain however it has not.  Patient has had previous referrals placed to neurology for further work-up and possible treatment plan of neuropathy as well.  Unfortunately because  the patient has neuropathy that resulted from a surgical intervention that was done at [facility] we will not be able to continue with pain management for her.  The patient is welcome to return to [facility] for possible pain management or I  can refer her to a pain management clinic.  We will refill a one-time prescription of tramadol for the patient today. - traMADol (ULTRAM) 50 MG tablet; Take 1 tablet (50 mg total) by mouth every 6 (six) hours as needed.  Dispense: 30 tablet; Refill: 0 - Ambulatory referral to Pain Clinic  2. PVD (peripheral vascular disease) ([place])  Recommend:  The patient has evidence of atherosclerosis of the lower extremities with claudication.  The patient does not voice lifestyle limiting changes at this point in time.  Noninvasive studies do not suggest clinically significant change.  No invasive studies, angiography or surgery at this time The patient should continue walking and begin a more formal exercise program.  The patient should continue antiplatelet therapy and aggressive treatment of the lipid abnormalities  No changes in the patient's medications at this time  The patient should continue wearing graduated compression socks 10-15 mmHg strength to control the mild edema.    3. Essential hypertension with goal blood pressure less than 140/90 Continue antihypertensive medications as already ordered, these medications have been reviewed and there are no changes at this time.   4. Hyperlipidemia, unspecified hyperlipidemia type Continue statin as ordered and reviewed, no changes at this time   5. Tobacco use disorder Smoking cessation was discussed, 3-10 minutes spent on this topic specifically    Current Outpatient Medications on File Prior to Visit  Medication Sig Dispense Refill  . albuterol (VENTOLIN HFA) 108 (90 Base) MCG/ACT inhaler Inhale 2 puffs into the lungs every 6 (six) hours as needed for wheezing or shortness of breath.     [name] amitriptyline (ELAVIL) 25 MG tablet Take 25 mg by mouth at bedtime.    [name] amLODipine (NORVASC) 5 MG tablet Take 1 tablet (5 mg total) by mouth daily. 30 tablet 2  . apixaban (ELIQUIS) 5 MG TABS tablet Take 5 mg by mouth 2 (two) times daily.    [name]  aspirin EC 81 MG tablet Take 81 mg by mouth daily. Swallow whole.    [name] atorvastatin (LIPITOR) 80 MG tablet Take 80 mg by mouth at bedtime.     . clotrimazole-betamethasone (LOTRISONE) cream Apply 1 application topically 2 (two) times daily as needed (for dry skin).     . Cysteamine Bitartrate (PROCYSBI) 300 MG PACK Use 30 each 2 (two) times daily For type II diabetes    . DULoxetine (CYMBALTA) 60 MG capsule Take 60 mg by mouth daily.    . ferrous sulfate 325 (65 FE) MG tablet Take 325 mg by mouth daily.    . fluconazole (DIFLUCAN) 100 MG tablet Take by mouth.    . furosemide (LASIX) 40 MG tablet Take by mouth.    . Ipratropium-Albuterol (COMBIVENT) 20-100 MCG/ACT AERS respimat Inhale into the lungs.    . metoprolol tartrate (LOPRESSOR) 50 MG tablet Take 1 tablet (50 mg total) by mouth 2 (two) times daily. 30 tablet 0  . Multiple Vitamin (MULTIVITAMIN WITH MINERALS) TABS tablet Take 1 tablet by mouth daily. 90 tablet 3  . nitrofurantoin, macrocrystal-monohydrate, (MACROBID) 100 MG capsule Take by mouth.    [name] omeprazole (PRILOSEC) 20 MG capsule Take 20 mg by mouth 2 (two) times daily before a meal.    . ondansetron (ZOFRAN) 4 MG  tablet TAKE 1 TABLET BY MOUTH EVERY 8 HOURS AS NEEDED FOR UP TO 7 DAYS FOR NAUSEA OR VOMITING    . oxybutynin (DITROPAN-XL) 10 MG 24 hr tablet Take 10 mg by mouth daily.    . potassium chloride (KLOR-CON) 10 MEQ tablet Take by mouth.    . pregabalin (LYRICA) 75 MG capsule Take 1 capsule (75 mg total) by mouth 2 (two) times daily. 60 capsule 0  . topiramate (TOPAMAX) 100 MG tablet Take 100 mg by mouth 2 (two) times daily.    [name] topiramate (TOPAMAX) 50 MG tablet Take by mouth.    [name] buPROPion (WELLBUTRIN XL) 150 MG 24 hr tablet Take 150 mg by mouth daily.      No current facility-administered medications on file prior to visit.    There are no Patient Instructions on file for this visit. No follow-ups on file.   Kris Hartmann, NP

## 2020-08-30 ENCOUNTER — Inpatient Hospital Stay: Admission: RE | Admit: 2020-08-30 | Payer: Medicare Other | Source: Ambulatory Visit

## 2020-09-06 ENCOUNTER — Other Ambulatory Visit: Admission: RE | Admit: 2020-09-06 | Payer: Medicare Other | Source: Ambulatory Visit

## 2020-09-08 ENCOUNTER — Ambulatory Visit: Admit: 2020-09-08 | Payer: Medicare Other | Admitting: Otolaryngology

## 2020-09-08 SURGERY — EXCISION, PAROTID GLAND
Anesthesia: General | Laterality: Right

## 2020-09-13 ENCOUNTER — Telehealth (INDEPENDENT_AMBULATORY_CARE_PROVIDER_SITE_OTHER): Payer: Self-pay

## 2020-09-13 NOTE — Telephone Encounter (Addendum)
Patient left a voicemail stating that she has made an appointment with Dr Amalia Hailey with [facility] for Tuesday and she was advise to contact our office for a referral to be sent to them to attach with appointment. I spoke with Eulogio Ditch NP and she recommended for the patient to contact her PCP for requesting referral. Patient has been made aware with medical recommendations.

## 2020-09-14 ENCOUNTER — Ambulatory Visit (INDEPENDENT_AMBULATORY_CARE_PROVIDER_SITE_OTHER): Payer: Medicare Other | Admitting: Podiatry

## 2020-09-14 ENCOUNTER — Other Ambulatory Visit: Payer: Self-pay

## 2020-09-14 DIAGNOSIS — Z79899 Other long term (current) drug therapy: Secondary | ICD-10-CM | POA: Diagnosis not present

## 2020-09-14 DIAGNOSIS — M79675 Pain in left toe(s): Secondary | ICD-10-CM

## 2020-09-14 DIAGNOSIS — D689 Coagulation defect, unspecified: Secondary | ICD-10-CM

## 2020-09-14 DIAGNOSIS — B351 Tinea unguium: Secondary | ICD-10-CM

## 2020-09-14 DIAGNOSIS — M79674 Pain in right toe(s): Secondary | ICD-10-CM

## 2020-09-14 NOTE — Progress Notes (Signed)
SUBJECTIVE Patient presents to office today complaining of elongated, thickened nails that cause pain while ambulating in shoes.  She is unable to trim her own nails.  Patient also states that she has had recent bypass surgeries of her left lower extremity as recent as August 2021.  She has neuropathy associated to the left lower extremity as well as chronic pain.  She currently takes Lyrica for the neuropathy with minimal relief.  Patient is here for further evaluation and treatment.  Past Medical History:  Diagnosis Date  . Abdominal aortic atherosclerosis ([place])    a. 05/2017 CTA abd/pelvis: significant atherosclerotic dzs of the infrarenal abd Ao w/ some mural thrombus. No aneurysm or dissection.  . Abdominal pain   . Acute focal ischemia of small intestine (HCC)   . Acute right-sided low back pain with right-sided sciatica 06/13/2017  . AKI (acute kidney injury) ([place]) 07/29/2017  . Baker's cyst of knee, right    a. 07/2016 U/S: 4.1 x 1.4 x 2.9 cystic structure in R poplitetal fossa.  . Bell's palsy   . Borderline diabetes   . Cancer ([place])    skin cancer on nose  . Chest pain    a. 08/2012 Lexiscan MV: EF 54%, non ischemia/infarct.  . CHF (congestive heart failure) ([place])   . Chronic idiopathic constipation   . Chronic mesenteric ischemia ([place])   . COPD (chronic obstructive pulmonary disease) ([place])   . Diastolic dysfunction    a. 05/2017 Echo: Ef 60-65%, no rwma, Gr1 DD, no source of cardiac emboli.  . 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[place])    a. 05/2017 CTA Abd/pelvis: apparent thrombus or embolus in prox SMA (70-90%); b. 05/2017 catheter directed tPA, mechanical thrombectomy, and stenting of the SMA.  . Essential hypertension with goal blood pressure less than 140/90 01/19/2016  . Gastroesophageal reflux disease 01/19/2016  . GERD (gastroesophageal reflux disease)   . H/O colectomy   . History of kidney stones   . Hyperlipidemia   . Hypertension   . Hypotension 07/29/2017  .  Morbid obesity with BMI of 40.0-44.9, adult ([place])   . Occlusive mesenteric ischemia ([place]) 06/19/2017  . Personal history of other malignant neoplasm of skin 06/01/2016  . Pre-diabetes   . Primary osteoarthritis of both knees 06/13/2017  . Recurrent major depressive disorder ([place]) 01/19/2016  . SBO (small bowel obstruction) ([place]) 08/06/2017  . 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 Urinary tract infection 01/09/2014    OBJECTIVE General Patient is awake, alert, and oriented x 3 and in no acute distress. Derm Skin is dry and supple bilateral. Negative open lesions or macerations. Remaining integument unremarkable. Nails are tender, long, thickened and dystrophic with subungual debris, consistent with onychomycosis, 1-5 bilateral. No signs of infection noted. Vasc  DP and PT pedal pulses palpable to the right lower extremity however they are diminished to the left. Temperature gradient within normal limits.  Neuro Epicritic and protective threshold sensation diminished bilaterally.  Left greater than right. Musculoskeletal Exam history of toe amputation left third toe due to ischemia  ASSESSMENT 1.  Pain due to onychomycosis of toenails both 2.  Peripheral vascular disease left lower extremity 3.  Chronic left lower extremity pain  PLAN OF CARE 1. Patient evaluated today.  2. Instructed to maintain good pedal hygiene and foot care.  3. Mechanical debridement of nails 1-5 bilaterally performed using a nail nipper. Filed with dremel without incident.  4.  Recommend referral from PCP to a pain specialist for  chronic left lower extremity pain.  In the meantime continue Lyrica as prescribed  5.  The patient would like to have the onychomycosis of the toenails treated with oral medication which would provide the best results.  Prior to treatment I did explain that the medication is filtered by the liver.  Order placed for a hepatic function panel.  If within normal limits we will prescribe the  oral Lamisil 250 mg #90  6.  Return to clinic in 3 mos. for routine care   Edrick Kins, DPM [facility]  Dr. Edrick Kins, [address] [place]                                        [place], [place] [address]                Office [phone]  Fax [phone]

## 2020-09-15 LAB — HEPATIC FUNCTION PANEL
ALT: 14 IU/L (ref 0–32)
AST: 8 IU/L (ref 0–40)
Albumin: 3.9 g/dL (ref 3.8–4.9)
Alkaline Phosphatase: 123 IU/L — ABNORMAL HIGH (ref 44–121)
Bilirubin Total: <0.2 mg/dL (ref 0.0–1.2)
Bilirubin, Direct: <0.1 mg/dL (ref 0.00–0.40)
Total Protein: 7 g/dL (ref 6.0–8.5)

## 2020-09-27 ENCOUNTER — Other Ambulatory Visit: Payer: Self-pay | Admitting: Internal Medicine

## 2020-09-27 DIAGNOSIS — Z1231 Encounter for screening mammogram for malignant neoplasm of breast: Secondary | ICD-10-CM

## 2020-09-29 ENCOUNTER — Ambulatory Visit (INDEPENDENT_AMBULATORY_CARE_PROVIDER_SITE_OTHER): Payer: Medicare Other | Admitting: Surgery

## 2020-09-29 ENCOUNTER — Other Ambulatory Visit: Payer: Self-pay

## 2020-09-29 ENCOUNTER — Encounter: Payer: Self-pay | Admitting: Surgery

## 2020-09-29 ENCOUNTER — Telehealth (INDEPENDENT_AMBULATORY_CARE_PROVIDER_SITE_OTHER): Payer: Self-pay

## 2020-09-29 VITALS — BP 120/85 | HR 78 | Temp 98.7°F | Ht 62.0 in | Wt 250.0 lb

## 2020-09-29 DIAGNOSIS — K439 Ventral hernia without obstruction or gangrene: Secondary | ICD-10-CM

## 2020-09-29 DIAGNOSIS — I70222 Atherosclerosis of native arteries of extremities with rest pain, left leg: Secondary | ICD-10-CM

## 2020-09-29 NOTE — Telephone Encounter (Signed)
Unfortunately, I have talked about this with Carla [name] multiple times.  Unfortunately because her surgery was done at [facility] we cannot continue to provide pain relief for her neuropathy.  She was recently seen by Neurology and they are working with her in regards to a regimen.  She should reach out them if the current methods are not working.  As I previously discussed with Carla [name], I will not give any more pain medication to treat her neuropathy. The previous prescription was a one time Rx.  She can also consult [facility].  As also previously offered, we can refer her to pain management.  If the patient is concerned about blood flow, we can bring her in for an ABI study only.  If it is abnormal we can see her but if it has remained consistent, she will have to follow the instructions listed above.  Again, we will not provide pain medication.

## 2020-09-29 NOTE — Telephone Encounter (Signed)
Patient was made aware with medical recommendations and informed that she will contact her PCP for pain medication.

## 2020-09-29 NOTE — Patient Instructions (Addendum)
We have ordered a CT of you abdomen. The CT is scheduled for 10/01/2020 @ 1 pm. Please arrive by 12:45 pm. Pick up contrast kit before Friday and follow the directions. Nothing to eat 4 hours prior to CT. Have your labs done after CT appointment. A follow up appointment has been schedule. Please see below.

## 2020-09-29 NOTE — Telephone Encounter (Signed)
New patient and isn't scheduled to Jan. Jama Flavors in and wanted to speak to Dr.Fallon due to the extreme pain in her left foot. Says she has neuropathy and needs to be seen right away. Says pain keeps her awake a night. Please advise

## 2020-09-29 NOTE — Progress Notes (Signed)
Outpatient Surgical Follow Up  09/29/2020  EDLIN FORD is an 59 y.o. female.   Chief Complaint  Patient presents with  . Follow-up    ventral hernia    HPI: Azile is a 59 year old female well-known to me with a history of laparotomy with small bowel resection for small bowel obstruction caused by small bowel stricture.  She does have significant peripheral vascular disease, chronic pain, COPD with active smoker and a BMI of 45. SHe is anticoagulated and on antiplatelet therapy. SHe comes in with worsening abdominal pain, no fevers no chills no nausea no vomiting.  Pain is intermittent moderate intensity and sharp in nature.  Past Medical History:  Diagnosis Date  . Abdominal aortic atherosclerosis ([place])    a. 05/2017 CTA abd/pelvis: significant atherosclerotic dzs of the infrarenal abd Ao w/ some mural thrombus. No aneurysm or dissection.  . Abdominal pain   . Acute focal ischemia of small intestine (HCC)   . Acute right-sided low back pain with right-sided sciatica 06/13/2017  . AKI (acute kidney injury) ([place]) 07/29/2017  . Baker's cyst of knee, right    a. 07/2016 U/S: 4.1 x 1.4 x 2.9 cystic structure in R poplitetal fossa.  . Bell's palsy   . Borderline diabetes   . Cancer ([place])    skin cancer on nose  . Chest pain    a. 08/2012 Lexiscan MV: EF 54%, non ischemia/infarct.  . CHF (congestive heart failure) ([place])   . Chronic idiopathic constipation   . Chronic mesenteric ischemia ([place])   . COPD (chronic obstructive pulmonary disease) ([place])   . Diastolic dysfunction    a. 05/2017 Echo: Ef 60-65%, no rwma, Gr1 DD, no source of cardiac emboli.  . 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 05/2017 CTA Abd/pelvis: apparent thrombus or embolus in prox SMA (70-90%); b. 05/2017 catheter directed tPA, mechanical thrombectomy, and stenting of the SMA.  . Essential hypertension with goal blood pressure less than 140/90 01/19/2016  . Gastroesophageal reflux disease 01/19/2016  . GERD  (gastroesophageal reflux disease)   . H/O colectomy   . History of kidney stones   . Hyperlipidemia   . Hypertension   . Hypotension 07/29/2017  . Morbid obesity with BMI of 40.0-44.9, adult ([place])   . Occlusive mesenteric ischemia ([place]) 06/19/2017  . Personal history of other malignant neoplasm of skin 06/01/2016  . Pre-diabetes   . Primary osteoarthritis of both knees 06/13/2017  . Recurrent major depressive disorder ([place]) 01/19/2016  . SBO (small bowel obstruction) ([place]) 08/06/2017  . 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 Urinary tract infection 01/09/2014    Past Surgical History:  Procedure Laterality Date  . AMPUTATION TOE Left 12/09/2019   Procedure: AMPUTATION TOE MPJ left;  Surgeon: Caroline More, DPM;  Location: [facility];  Service: Podiatry;  Laterality: Left;  . APPENDECTOMY    . CHOLECYSTECTOMY    . LAPAROTOMY N/A 08/08/2017   Procedure: EXPLORATORY LAPAROTOMY POSSIBLE BOWEL RESECTION;  Surgeon: Jules Husbands, MD;  Location: [facility];  Service: General;  Laterality: N/A;  . LOWER EXTREMITY ANGIOGRAPHY Left 11/17/2019   Procedure: LOWER EXTREMITY ANGIOGRAPHY;  Surgeon: Algernon Huxley, MD;  Location: [facility];  Service: Cardiovascular;  Laterality: Left;  . LOWER EXTREMITY INTERVENTION N/A 11/19/2019   Procedure: LOWER EXTREMITY INTERVENTION;  Surgeon: Algernon Huxley, MD;  Location: [facility];  Service: Cardiovascular;  Laterality: N/A;  . TEE WITHOUT CARDIOVERSION N/A 06/22/2017   Procedure: TRANSESOPHAGEAL ECHOCARDIOGRAM (  TEE);  Surgeon: [place] Hampshire, MD;  Location: [facility];  Service: Cardiovascular;  Laterality: N/A;  . TOTAL KNEE ARTHROPLASTY Right 07/11/2018   Procedure: TOTAL KNEE ARTHROPLASTY;  Surgeon: Corky Mull, MD;  Location: [facility];  Service: Orthopedics;  Laterality: Right;  [name] VAGINAL HYSTERECTOMY    . VISCERAL ARTERY INTERVENTION N/A 06/20/2017   Procedure: Visceral Artery Intervention, possible aortic thrombectomy;  Surgeon: Algernon Huxley, MD;  Location: [facility];  Service: Cardiovascular;  Laterality: N/A;  . VISCERAL ARTERY INTERVENTION N/A 01/28/2018   Procedure: VISCERAL ARTERY INTERVENTION;  Surgeon: Algernon Huxley, MD;  Location: [facility];  Service: Cardiovascular;  Laterality: N/A;    Family History  Problem Relation Age of Onset  . Hypertension Mother   . Heart disease Mother   . Heart attack Mother   . Breast cancer Mother   . Hypertension Father   . Breast cancer Paternal Aunt     Social History:  reports that she has been smoking cigarettes. She has been smoking about 0.10 packs per day. She has never used smokeless tobacco. She reports that she does not drink alcohol and does not use drugs.  Allergies:  Allergies  Allergen Reactions  . Gabapentin     Tremors   . Bactrim [Sulfamethoxazole-Trimethoprim] Itching    Medications reviewed.    ROS Full ROS performed and is otherwise negative other than what is stated in HPI   BP 120/85   Pulse 78   Temp 98.7 F (37.1 C) (Oral)   Ht 5\' 2"  (1.575 m)   Wt 250 lb (113.4 kg)   SpO2 93%   BMI 45.73 kg/m   Physical Exam Vitals and nursing note reviewed. Exam conducted with a chaperone present.  Constitutional:      General: She is not in acute distress.    Appearance: Normal appearance. She is obese.  Cardiovascular:     Rate and Rhythm: Normal rate and regular rhythm.     Heart sounds: No murmur heard.   Pulmonary:     Effort: Pulmonary effort is normal. No respiratory distress.     Breath sounds: Normal breath sounds. No stridor.  Abdominal:     Palpations: Abdomen is soft.     Tenderness: There is no abdominal tenderness. There is no guarding.     Hernia: A hernia is present.     Comments: Evidence of a large ventral hernia with loss of domain.  I do think that is chronically incarcerated.  Is tender to palpation but without peritonitis or evidence of strangulation.  There is some skin changes within the hernia  sac the seem to have a resolve ulceration  Musculoskeletal:        General: Normal range of motion.     Cervical back: Normal range of motion. No rigidity.  Skin:    General: Skin is warm and dry.     Capillary Refill: Capillary refill takes less than 2 seconds.  Neurological:     General: No focal deficit present.     Mental Status: She is alert and oriented to person, place, and time.  Psychiatric:        Mood and Affect: Mood normal.        Behavior: Behavior normal.        Thought Content: Thought content normal.        Judgment: Judgment normal.        No results found for this or any previous visit (  from the past 48 hour(s)). No results found.  Assessment/Plan: 59 year old female with symptomatic ventral hernia significant comorbidities to include BMI of 45.  Given the crescendo symptoms and questionable ulceration of the skin I will make sure that there has been no changes regarding her abdominal wall.  I will also obtain a CT scan of the abdomen pelvis along with a CBC and CMP and we will get short follow-up.  Patient is nontoxic does not need to be admitted to the hospital and does not require emergent surgical intervention.  This a difficult situation given her multiple risk factors but at the same time crescendo symptoms can also be tricky and I am not sureif she will ever be optimized from a weight perspective.  Greater than 50% of the 25 minutes  visit was spent in counseling/coordination of care   Caroleen Hamman, MD [place] Surgeon

## 2020-09-30 ENCOUNTER — Other Ambulatory Visit
Admission: RE | Admit: 2020-09-30 | Discharge: 2020-09-30 | Disposition: A | Discharge status: Home / Self Care | Payer: Medicare Other | Source: Ambulatory Visit | Attending: Surgery | Admitting: Surgery

## 2020-09-30 DIAGNOSIS — Z6841 Body Mass Index (BMI) 40.0 and over, adult: Secondary | ICD-10-CM | POA: Insufficient documentation

## 2020-09-30 DIAGNOSIS — K439 Ventral hernia without obstruction or gangrene: Secondary | ICD-10-CM | POA: Insufficient documentation

## 2020-09-30 LAB — COMPREHENSIVE METABOLIC PANEL
ALT: 13 U/L (ref 0–44)
AST: 15 U/L (ref 15–41)
Albumin: 3.3 g/dL — ABNORMAL LOW (ref 3.5–5.0)
Alkaline Phosphatase: 89 U/L (ref 38–126)
Anion gap: 12 (ref 5–15)
BUN: 10 mg/dL (ref 6–20)
CO2: 24 mmol/L (ref 22–32)
Calcium: 9.2 mg/dL (ref 8.9–10.3)
Chloride: 103 mmol/L (ref 98–111)
Creatinine, Ser: 0.83 mg/dL (ref 0.44–1.00)
GFR, Estimated: >60 mL/min (ref >60)
Glucose, Bld: 146 mg/dL — ABNORMAL HIGH (ref 70–99)
Potassium: 3.7 mmol/L (ref 3.5–5.1)
Sodium: 139 mmol/L (ref 135–145)
Total Bilirubin: 0.5 mg/dL (ref 0.3–1.2)
Total Protein: 7 g/dL (ref 6.5–8.1)

## 2020-10-01 ENCOUNTER — Encounter (INDEPENDENT_AMBULATORY_CARE_PROVIDER_SITE_OTHER): Payer: Medicare Other

## 2020-10-01 ENCOUNTER — Ambulatory Visit (INDEPENDENT_AMBULATORY_CARE_PROVIDER_SITE_OTHER): Payer: Medicare Other | Admitting: Vascular Surgery

## 2020-10-01 ENCOUNTER — Ambulatory Visit
Admission: RE | Admit: 2020-10-01 | Discharge: 2020-10-01 | Disposition: A | Discharge status: Home / Self Care | Payer: Medicare Other | Source: Ambulatory Visit | Attending: Surgery | Admitting: Surgery

## 2020-10-01 ENCOUNTER — Other Ambulatory Visit: Payer: Self-pay

## 2020-10-01 DIAGNOSIS — K439 Ventral hernia without obstruction or gangrene: Secondary | ICD-10-CM

## 2020-10-01 MED ORDER — IOHEXOL 300 MG/ML  SOLN
100.0000 mL | Freq: Once | INTRAMUSCULAR | Status: AC | PRN
  Administered 2020-10-01: 100 mL via INTRAVENOUS

## 2020-10-04 ENCOUNTER — Telehealth: Payer: Self-pay

## 2020-10-04 NOTE — Telephone Encounter (Signed)
-----   Message from Jules Husbands, MD sent at 10/04/2020  9:46 AM EST ----- Please tell her CT did not show any complications , she does have a large hernia ----- Message ----- From: Interface, Rad Results In Sent: 10/01/2020   1:39 PM EST To: Jules Husbands, MD

## 2020-10-04 NOTE — Telephone Encounter (Signed)
Left detailed message for patient notifying her of recent CT results. Informed patient to give our office a callback if she has any questions or concerns regarding the her CT results.

## 2020-10-06 ENCOUNTER — Other Ambulatory Visit: Payer: Self-pay

## 2020-10-06 ENCOUNTER — Encounter: Payer: Self-pay | Admitting: Surgery

## 2020-10-06 ENCOUNTER — Ambulatory Visit (INDEPENDENT_AMBULATORY_CARE_PROVIDER_SITE_OTHER): Payer: Medicare Other | Admitting: Surgery

## 2020-10-06 VITALS — BP 154/81 | HR 93 | Temp 98.1°F | Ht 62.0 in | Wt 251.2 lb

## 2020-10-06 DIAGNOSIS — I70222 Atherosclerosis of native arteries of extremities with rest pain, left leg: Secondary | ICD-10-CM | POA: Diagnosis not present

## 2020-10-06 DIAGNOSIS — I5032 Chronic diastolic (congestive) heart failure: Secondary | ICD-10-CM | POA: Insufficient documentation

## 2020-10-06 DIAGNOSIS — K439 Ventral hernia without obstruction or gangrene: Secondary | ICD-10-CM

## 2020-10-06 NOTE — Patient Instructions (Addendum)
Try to take a 15-30 minute walk daily to maintain a healthy weight.  I have placed the referral to the Pain Clinic-someone from their office will call to schedule an appointment. Please see your follow up appointment listed below.

## 2020-10-11 ENCOUNTER — Encounter: Payer: Self-pay | Admitting: Surgery

## 2020-10-11 NOTE — Progress Notes (Signed)
Outpatient Surgical Follow Up  10/11/2020  Carla [name] is an 59 y.o. female.   Chief Complaint  Patient presents with  . Follow-up    Ventral hernia-discuss CT results    HPI: Carla [name] is a 59 year old female well-known to me with a history of laparotomy with small bowel resection for small bowel obstruction caused by small bowel stricture.  She does have significant peripheral vascular disease, chronic pain, COPD with active smoker and a BMI of 45. SHe is anticoagulated and on antiplatelet therapy. SHe comes in with worsening abdominal pain, no fevers no chills no nausea no vomiting.  Pain is intermittent moderate intensity and sharp in nature. I have performed a CT scan of the abdomen I have personally reviewed showing evidence of a large ventral defect with chronic incarceration of bowel.  There is no evidence of any bowel compromise. He has decreased her smoking and apparently is trying to lose some weight. Does have an upcoming appointment with pain medicine. MP shows normal creatinine normal LFTs and albumin of 3.3.   Past Medical History:  Diagnosis Date  . Abdominal aortic atherosclerosis ([place])    a. 05/2017 CTA abd/pelvis: significant atherosclerotic dzs of the infrarenal abd Ao w/ some mural thrombus. No aneurysm or dissection.  . Abdominal pain   . Acute focal ischemia of small intestine (HCC)   . Acute right-sided low back pain with right-sided sciatica 06/13/2017  . AKI (acute kidney injury) ([place]) 07/29/2017  . Baker's cyst of knee, right    a. 07/2016 U/S: 4.1 x 1.4 x 2.9 cystic structure in R poplitetal fossa.  . Bell's palsy   . Borderline diabetes   . Cancer ([place])    skin cancer on nose  . Chest pain    a. 08/2012 Lexiscan MV: EF 54%, non ischemia/infarct.  . CHF (congestive heart failure) ([place])   . Chronic idiopathic constipation   . Chronic mesenteric ischemia ([place])   . COPD (chronic obstructive pulmonary disease) ([place])   . Diastolic dysfunction    a. 05/2017  Echo: Ef 60-65%, no rwma, Gr1 DD, no source of cardiac emboli.  . 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 05/2017 CTA Abd/pelvis: apparent thrombus or embolus in prox SMA (70-90%); b. 05/2017 catheter directed tPA, mechanical thrombectomy, and stenting of the SMA.  . Essential hypertension with goal blood pressure less than 140/90 01/19/2016  . Gastroesophageal reflux disease 01/19/2016  . GERD (gastroesophageal reflux disease)   . H/O colectomy   . History of kidney stones   . Hyperlipidemia   . Hypertension   . Hypotension 07/29/2017  . Morbid obesity with BMI of 40.0-44.9, adult ([place])   . Occlusive mesenteric ischemia ([place]) 06/19/2017  . Personal history of other malignant neoplasm of skin 06/01/2016  . Pre-diabetes   . Primary osteoarthritis of both knees 06/13/2017  . Recurrent major depressive disorder ([place]) 01/19/2016  . SBO (small bowel obstruction) ([place]) 08/06/2017  . 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 Urinary tract infection 01/09/2014    Past Surgical History:  Procedure Laterality Date  . AMPUTATION TOE Left 12/09/2019   Procedure: AMPUTATION TOE MPJ left;  Surgeon: Caroline More, DPM;  Location: [facility];  Service: Podiatry;  Laterality: Left;  . APPENDECTOMY    . CHOLECYSTECTOMY    . LAPAROTOMY N/A 08/08/2017   Procedure: EXPLORATORY LAPAROTOMY POSSIBLE BOWEL RESECTION;  Surgeon: Jules Husbands, MD;  Location: [facility];  Service: General;  Laterality: N/A;  . LOWER EXTREMITY ANGIOGRAPHY Left  11/17/2019   Procedure: LOWER EXTREMITY ANGIOGRAPHY;  Surgeon: Algernon Huxley, MD;  Location: [facility];  Service: Cardiovascular;  Laterality: Left;  . LOWER EXTREMITY INTERVENTION N/A 11/19/2019   Procedure: LOWER EXTREMITY INTERVENTION;  Surgeon: Algernon Huxley, MD;  Location: [facility];  Service: Cardiovascular;  Laterality: N/A;  . TEE WITHOUT CARDIOVERSION N/A 06/22/2017   Procedure: TRANSESOPHAGEAL ECHOCARDIOGRAM (TEE);  Surgeon: [place] Hampshire, MD;  Location: [facility];  Service: Cardiovascular;  Laterality: N/A;  . TOTAL KNEE ARTHROPLASTY Right 07/11/2018   Procedure: TOTAL KNEE ARTHROPLASTY;  Surgeon: Corky Mull, MD;  Location: [facility];  Service: Orthopedics;  Laterality: Right;  [name] VAGINAL HYSTERECTOMY    . VISCERAL ARTERY INTERVENTION N/A 06/20/2017   Procedure: Visceral Artery Intervention, possible aortic thrombectomy;  Surgeon: Algernon Huxley, MD;  Location: [facility];  Service: Cardiovascular;  Laterality: N/A;  . VISCERAL ARTERY INTERVENTION N/A 01/28/2018   Procedure: VISCERAL ARTERY INTERVENTION;  Surgeon: Algernon Huxley, MD;  Location: [facility];  Service: Cardiovascular;  Laterality: N/A;    Family History  Problem Relation Age of Onset  . Hypertension Mother   . Heart disease Mother   . Heart attack Mother   . Breast cancer Mother   . Hypertension Father   . Breast cancer Paternal Aunt     Social History:  reports that she has been smoking cigarettes. She has been smoking about 0.10 packs per day. She has never used smokeless tobacco. She reports that she does not drink alcohol and does not use drugs.  Allergies:  Allergies  Allergen Reactions  . Gabapentin     Tremors   . Bactrim [Sulfamethoxazole-Trimethoprim] Itching    Medications reviewed.    ROS Full ROS performed and is otherwise negative other than what is stated in HPI   BP (!) 154/81   Pulse 93   Temp 98.1 F (36.7 C) (Oral)   Ht 5\' 2"  (1.575 m)   Wt 251 lb 3.2 oz (113.9 kg)   SpO2 96%   BMI 45.95 kg/m   Physical Exam Physical Exam Vitals and nursing note reviewed. Exam conducted with a chaperone present.  Constitutional:      General: She is not in acute distress.    Appearance: Normal appearance. She is obese.  Cardiovascular:     Rate and Rhythm: Normal rate and regular rhythm.     Heart sounds: No murmur heard.   Pulmonary:     Effort: Pulmonary effort is normal. No respiratory distress.     Breath  sounds: Normal breath sounds. No stridor.  Abdominal:     Palpations: Abdomen is soft.     Tenderness: There is no abdominal tenderness. There is no guarding.     Hernia: A hernia is present.     Comments: Evidence of a large ventral hernia with loss of domain. iT is chronically incarcerated. It  Is tender to palpation but without peritonitis or evidence of strangulation.  There is some skin changes within the hernia .Musculoskeletal:        General: Normal range of motion.     Cervical back: Normal range of motion. No rigidity.  Skin:    General: Skin is warm and dry.     Capillary Refill: Capillary refill takes less than 2 seconds.  Neurological:     General: No focal deficit present.     Mental Status: She is alert and oriented to person, place, and time.  Psychiatric:        Mood and Affect: Mood normal.        Behavior: Behavior normal.        Thought Content: Thought content normal.        Judgment: Judgment normal.     Assessment/Plan: 59 year old female with symptomatic ventral hernia significant comorbidities to include BMI of 45.   Patient continues to have crescendo symptoms.  I do not think that we may have the luxury of optimizing her weight but we will try to work with her as much as possible at decreasing other risk factors and improving her weight.  I stated that I want her to see pain management so we can also establish a game plan from that perspective.  She is committed to walk every day and also committed to stop smoking.  I will see her back in about 3 to 4 weeks and at that time we will tentatively schedule for big surgery.  She fully understand that she is at high risk of recurrence, she is at high risk of perioperative morbidity and mortality but she is also willing to take the risk because of her unbearable symptoms  Patient is nontoxic does not need to be admitted to the hospital and does not require emergent surgical intervention.   Extensive counseling  provided Greater than 50% of the 45 minutes  visit was spent in counseling/coordination of care   Caroleen Hamman, MD [facility] Surgeon

## 2020-10-19 ENCOUNTER — Encounter (INDEPENDENT_AMBULATORY_CARE_PROVIDER_SITE_OTHER): Payer: Medicare Other

## 2020-10-19 ENCOUNTER — Ambulatory Visit (INDEPENDENT_AMBULATORY_CARE_PROVIDER_SITE_OTHER): Payer: Medicare Other | Admitting: Vascular Surgery

## 2020-10-26 ENCOUNTER — Other Ambulatory Visit: Payer: Self-pay

## 2020-10-26 ENCOUNTER — Encounter: Payer: Self-pay | Admitting: Student in an Organized Health Care Education/Training Program

## 2020-10-26 ENCOUNTER — Ambulatory Visit
Payer: Medicare Other | Attending: Student in an Organized Health Care Education/Training Program | Admitting: Student in an Organized Health Care Education/Training Program

## 2020-10-26 VITALS — BP 138/73 | HR 109 | Temp 97.1°F | Resp 20 | Ht 62.0 in | Wt 242.0 lb

## 2020-10-26 DIAGNOSIS — M5137 Other intervertebral disc degeneration, lumbosacral region: Secondary | ICD-10-CM | POA: Insufficient documentation

## 2020-10-26 DIAGNOSIS — M25561 Pain in right knee: Secondary | ICD-10-CM | POA: Insufficient documentation

## 2020-10-26 DIAGNOSIS — F331 Major depressive disorder, recurrent, moderate: Secondary | ICD-10-CM | POA: Diagnosis present

## 2020-10-26 DIAGNOSIS — M25562 Pain in left knee: Secondary | ICD-10-CM | POA: Diagnosis present

## 2020-10-26 DIAGNOSIS — G894 Chronic pain syndrome: Secondary | ICD-10-CM | POA: Insufficient documentation

## 2020-10-26 DIAGNOSIS — M170 Bilateral primary osteoarthritis of knee: Secondary | ICD-10-CM | POA: Diagnosis not present

## 2020-10-26 DIAGNOSIS — M792 Neuralgia and neuritis, unspecified: Secondary | ICD-10-CM | POA: Diagnosis present

## 2020-10-26 DIAGNOSIS — Z96651 Presence of right artificial knee joint: Secondary | ICD-10-CM | POA: Diagnosis present

## 2020-10-26 DIAGNOSIS — K5904 Chronic idiopathic constipation: Secondary | ICD-10-CM | POA: Diagnosis present

## 2020-10-26 DIAGNOSIS — M545 Low back pain, unspecified: Secondary | ICD-10-CM | POA: Insufficient documentation

## 2020-10-26 DIAGNOSIS — F17200 Nicotine dependence, unspecified, uncomplicated: Secondary | ICD-10-CM | POA: Diagnosis present

## 2020-10-26 DIAGNOSIS — Z716 Tobacco abuse counseling: Secondary | ICD-10-CM | POA: Insufficient documentation

## 2020-10-26 DIAGNOSIS — K551 Chronic vascular disorders of intestine: Secondary | ICD-10-CM | POA: Diagnosis present

## 2020-10-26 DIAGNOSIS — G8929 Other chronic pain: Secondary | ICD-10-CM | POA: Insufficient documentation

## 2020-10-26 MED ORDER — ALPHA-LIPOIC ACID 600 MG PO CAPS: 600.0000 mg | ORAL_CAPSULE | Freq: Every day | ORAL | 2 refills | Status: DC

## 2020-10-26 NOTE — Progress Notes (Signed)
Safety precautions to be maintained throughout the outpatient stay will include: orient to surroundings, keep bed in low position, maintain call bell within reach at all times, provide assistance with transfer out of bed and ambulation.  

## 2020-10-26 NOTE — Progress Notes (Signed)
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 malignant neoplasm of skin 06/01/2016  . Prediabetes 01/19/2016  . Osteoarthritis of knee (Bilateral) 06/13/2017  . Urinary tract infection 01/09/2014  . Recurrent major depressive disorder ([place]) 01/19/2016  . AKI (acute kidney injury) ([place]) 07/29/2017  . Hypotension 07/29/2017  . SBO (small bowel obstruction) ([place]) 08/06/2017  . Chronic mesenteric ischemia ([place])   . Protein-calorie malnutrition, severe 08/08/2017  . Chronic idiopathic constipation   . Adjustment disorder with anxiety 08/15/2017  . Accelerated hypertension 04/08/2018  . Acute encephalopathy 04/08/2018  . Paroxysmal atrial fibrillation ([place]) 06/15/2018  . History of total knee replacement (Right) 07/11/2018  . Aortic atherosclerosis ([place]) 09/12/2018  . COPD (chronic obstructive pulmonary disease) ([place]) 09/12/2018  . DDD (degenerative disc disease), lumbosacral 09/25/2017  . Lumbar  spondylosis 09/06/2018  . Moderate episode of recurrent major depressive disorder ([place]) 09/12/2018  . Osteoarthritis of knee 09/25/2017  . Rib pain on left side 03/25/2019  . Ventral hernia with obstruction and without gangrene 07/02/2018  . Health care maintenance 03/26/2019  . Disorder of skeletal system 03/26/2019  . Problems influencing health status 03/26/2019  . Chronic low back pain (Secondary area of Pain) (Bilateral) w/o sciatica 03/26/2019  . Carpal tunnel syndrome, right 11/10/2019  . Ischemia of  left lower extremity 11/17/2019  . Bursitis of right shoulder 12/01/2019  . Chronic thumb pain, right 12/01/2019  . Primary osteoarthritis involving multiple joints 12/01/2019  . Neuropathic pain 08/19/2020  . Tobacco abuse counseling 10/26/2020  . Smoker 10/26/2020   Resolved Ambulatory Problems    Diagnosis Date Noted  . No Resolved Ambulatory Problems   Past Medical History:  Diagnosis Date  . Abdominal aortic atherosclerosis ([place])   . Baker's cyst of knee, right   . Bell's palsy   . Borderline diabetes   . Cancer ([place])   . CHF (congestive heart failure) ([place])   . Diastolic dysfunction   . GERD (gastroesophageal reflux disease)   . H/O colectomy   . History of kidney stones   . Hypertension   . Pre-diabetes    Constitutional Exam  General appearance: alert, cooperative and in mild distress Vitals:   10/26/20 1013  BP: 138/73  Pulse: (!) 109  Resp: 20  Temp: (!) 97.1 F (36.2 C)  TempSrc: Temporal  SpO2: 99%  Weight: 242 lb (109.8 kg)  Height: $Remove'5\' 2"'lKIdFEj$  (1.575 m)   BMI Assessment: Estimated body mass index is 44.26 kg/m as calculated from the following:   Height as of this encounter: $RemoveBeforeD'5\' 2"'XFgBAxPGXziqUx$  (1.575 m).   Weight as of this encounter: 242 lb (109.8 kg).  BMI interpretation table: BMI level Category Range association with higher incidence of chronic pain  <18 kg/m2 Underweight   18.5-24.9 kg/m2 Ideal body weight   25-29.9 kg/m2 Overweight Increased incidence by  20%  30-34.9 kg/m2 Obese (Class I) Increased incidence by 68%  35-39.9 kg/m2 Severe obesity (Class II) Increased incidence by 136%  >40 kg/m2 Extreme obesity (Class III) Increased incidence by 254%   Patient's current BMI Ideal Body weight  Body mass index is 44.26 kg/m. Ideal body weight: 50.1 kg (110 lb 7.2 oz) Adjusted ideal body weight: 74 kg (163 lb 1.1 oz)   BMI Readings from Last 4 Encounters:  10/26/20 44.26 kg/m  10/06/20 45.95 kg/m  09/29/20 45.73 kg/m  08/20/20 46.09 kg/m   Wt Readings from Last 4 Encounters:  10/26/20 242 lb (109.8 kg)  10/06/20 251 lb 3.2 oz (113.9 kg)  09/29/20 250 lb (113.4 kg)  08/20/20 252 lb (114.3 kg)    Psych/Mental status: Alert, oriented x 3 (person, place, & time)       Eyes: PERLA Respiratory: No evidence of acute respiratory distress  Lumbar Exam  Skin & Axial Inspection: No masses, redness, or swelling Alignment: Symmetrical Functional ROM: Pain restricted ROM       Stability: No instability detected Muscle Tone/Strength: Functionally intact. No obvious neuro-muscular anomalies detected. Sensory (Neurological): Musculoskeletal pain pattern   Gait & Posture Assessment  Ambulation: Limited Gait: Antalgic gait (limping) Posture: Difficulty standing up straight, due to pain   Lower Extremity Exam    Side: Right lower extremity  Side: Left lower extremity  Stability: No instability observed          Stability: No instability observed          Skin & Extremity Inspection: Evidence of prior arthroplastic surgery  Skin & Extremity Inspection: Evidence of prior arthroplastic surgery  Functional ROM: Pain restricted ROM                  Functional ROM: Pain restricted ROM                  Muscle Tone/Strength: Functionally intact. No obvious neuro-muscular anomalies detected.  Muscle Tone/Strength:  Functionally intact. No obvious neuro-muscular anomalies detected.  Sensory (Neurological): Musculoskeletal pain pattern        Sensory  (Neurological): Musculoskeletal pain pattern        DTR: Patellar: deferred today Achilles: deferred today Plantar: deferred today  DTR: Patellar: deferred today Achilles: deferred today Plantar: deferred today  Palpation: No palpable anomalies  Palpation: No palpable anomalies   Assessment  Primary Diagnosis & Pertinent Problem List: The primary encounter diagnosis was Chronic pain syndrome. Diagnoses of Chronic knee pain (Primary Area of Pain) (Bilateral), Osteoarthritis of knee (Bilateral), Chronic low back pain (Secondary area of Pain) (Bilateral) w/o sciatica, DDD (degenerative disc disease), lumbosacral, Neuropathic pain, Chronic mesenteric ischemia (HCC), History of total knee replacement (Right), Moderate episode of recurrent major depressive disorder ([place]), Chronic idiopathic constipation, Smoker, and Tobacco abuse counseling were also pertinent to this visit.  Visit Diagnosis (New problems to examiner): 1. Chronic pain syndrome   2. Chronic knee pain (Primary Area of Pain) (Bilateral)   3. Osteoarthritis of knee (Bilateral)   4. Chronic low back pain (Secondary area of Pain) (Bilateral) w/o sciatica   5. DDD (degenerative disc disease), lumbosacral   6. Neuropathic pain   7. Chronic mesenteric ischemia ([place])   8. History of total knee replacement (Right)   9. Moderate episode of recurrent major depressive disorder ([place])   10. Chronic idiopathic constipation   11. Smoker   12. Tobacco abuse counseling    Plan of Care (Initial workup plan)  Note: Ms. Napoleon has communicated to [place] that she is not interested in our services, at this time.  Trilby presents today with persistent and severe lower extremity neuropathic pain that has been refractory to various neuropathic's including amitriptyline, gabapentin, Lyrica.  She is currently on Lyrica 100 mg 3 times a day which is a fairly high dose.  I recommend that she consider increasing her amitriptyline to 50 mg nightly for  analgesic/neuropathic benefit.  She has tried lumbar spinal injections and is not interested in any interventional treatments.  She is not interested in lidocaine infusion for lower extremity neuropathic pain.  I recommend that she consider physical therapy but she is not interested.  Patient does have a history of severe obesity which I think is strongly contributing to her lower back musculoskeletal pain.  I recommend aquatic therapy for the patient she declined.  I do not recommend chronic opioid therapy for her condition in the context of her being on multiple sedative medications, poor historian, psychiatric history including depression and anxiety, significant cardiac history and concerns of obstructive sleep apnea as well as a history of chronic constipation.  I also counseled the patient on smoking cessation.  Given that the patient is not a candidate for chronic opioid therapy nor is she interested in any physical therapy or interventional treatments, and follow-up with her primary care provider for further care.   Pharmacotherapy (current): Medications ordered:  Meds ordered this encounter  Medications  . Alpha-Lipoic Acid 600 MG CAPS    Sig: Take 1 capsule (600 mg total) by mouth daily.    Dispense:  30 capsule    Refill:  2    Do not place medication on "Automatic Refill". Fill one day early if pharmacy is closed on scheduled refill date.   Medications administered during this visit: Elliet Goodnow. [place] had no medications administered during this visit.   Provider-requested follow-up: Return if symptoms worsen or fail to improve.  Future Appointments  Date Time Provider [place]  11/04/2020  9:40 AM [facility]  11/08/2020 10:30 AM Pabon, Marjory Lies, MD [facility] None  12/16/2020 10:15 AM Gardiner Barefoot, DPM [facility]  02/15/2021 10:30 AM [facility] 3 [facility] None  02/15/2021 11:30 AM Dew, Erskine Squibb, MD [facility] None    Note by: Gillis Santa, MD Date: 10/26/2020;  Time: 11:05 AM

## 2020-10-26 NOTE — Patient Instructions (Signed)
Recommend Alpha Lipoic Acid. Take 600mg  every day.

## 2020-11-08 ENCOUNTER — Ambulatory Visit: Payer: Medicare Other | Admitting: Surgery

## 2020-11-10 ENCOUNTER — Encounter: Payer: Self-pay | Admitting: Surgery

## 2020-11-10 ENCOUNTER — Ambulatory Visit (INDEPENDENT_AMBULATORY_CARE_PROVIDER_SITE_OTHER): Payer: Medicare Other | Admitting: Surgery

## 2020-11-10 ENCOUNTER — Telehealth: Payer: Self-pay

## 2020-11-10 ENCOUNTER — Other Ambulatory Visit: Payer: Self-pay

## 2020-11-10 VITALS — BP 136/80 | HR 106 | Temp 98.2°F | Ht 62.0 in | Wt 238.0 lb

## 2020-11-10 DIAGNOSIS — K439 Ventral hernia without obstruction or gangrene: Secondary | ICD-10-CM

## 2020-11-10 MED ORDER — CYCLOBENZAPRINE HCL 5 MG PO TABS: 5.0000 mg | ORAL_TABLET | Freq: Three times a day (TID) | ORAL | 0 refills | Status: DC | PRN

## 2020-11-10 MED ORDER — PREGABALIN 200 MG PO CAPS: 200.0000 mg | ORAL_CAPSULE | Freq: Two times a day (BID) | ORAL | 0 refills | Status: DC

## 2020-11-10 NOTE — Patient Instructions (Addendum)
We will send the referral to the Psychiatrist. We will see you back in 6 weeks. Continue to maintain a healthy weight.   Ventral Hernia  A ventral hernia is a bulge of tissue from inside the abdomen that pushes through a weak area of the muscles that form the front wall of the abdomen. The tissues inside the abdomen are inside a sac (peritoneum). These tissues include the small intestine, large intestine, and the fatty tissue that covers the intestines (omentum). Sometimes, the bulge that forms a hernia contains intestines. Other hernias contain only fat. Ventral hernias do not go away without surgical treatment. There are several types of ventral hernias. You may have:  A hernia at an incision site from previous abdominal surgery (incisional hernia).  A hernia just above the belly button (epigastric hernia), or at the belly button (umbilical hernia). These types of hernias can develop from heavy lifting or straining.  A hernia that comes and goes (reducible hernia). It may be visible only when you lift or strain. This type of hernia can be pushed back into the abdomen (reduced).  A hernia that traps abdominal tissue inside the hernia (incarcerated hernia). This type of hernia does not reduce.  A hernia that cuts off blood flow to the tissues inside the hernia (strangulated hernia). The tissues can start to die if this happens. This is a very painful bulge that cannot be reduced. A strangulated hernia is a medical emergency. What are the causes? This condition is caused by abdominal tissue putting pressure on an area of weakness in the abdominal muscles. What increases the risk? The following factors may make you more likely to develop this condition:  Being age 50 or older.  Being overweight or obese.  Having had previous abdominal surgery, especially if there was an infection after surgery.  Having had an injury to the abdominal wall.  Frequently lifting or pushing heavy  objects.  Having had several pregnancies.  Having a buildup of fluid inside the abdomen (ascites).  Straining to have a bowel movement or to urinate.  Having frequent coughing episodes. What are the signs or symptoms? The only symptom of a ventral hernia may be a painless bulge in the abdomen. A reducible hernia may be visible only when you strain, cough, or lift. Other symptoms may include:  Dull pain.  A feeling of pressure. Signs and symptoms of a strangulated hernia may include:  Increasing pain.  Nausea and vomiting.  Pain when pressing on the hernia.  The skin over the hernia turning red or purple.  Constipation.  Blood in the stool (feces). How is this diagnosed? This condition may be diagnosed based on:  Your symptoms.  Your medical history.  A physical exam. You may be asked to cough or strain while standing. These actions increase the pressure inside your abdomen and force the hernia through the opening in your muscles. Your health care provider may try to reduce the hernia by gently pushing the hernia back in.  Imaging studies, such as an ultrasound or CT scan. How is this treated? This condition is treated with surgery. If you have a strangulated hernia, surgery is done as soon as possible. If your hernia is small and not incarcerated, you may be asked to lose some weight before surgery. Follow these instructions at home:  Follow instructions from your health care provider about eating or drinking restrictions.  If you are overweight, your health care provider may recommend that you increase your activity level and  eat a healthier diet.  Do not lift anything that is heavier than 10 lb (4.5 kg), or the limit that you are told, until your health care provider says that it is safe.  Return to your normal activities as told by your health care provider. Ask your health care provider what activities are safe for you. You may need to avoid activities that  increase pressure on your hernia.  Take over-the-counter and prescription medicines only as told by your health care provider.  Keep all follow-up visits. This is important. Contact a health care provider if:  Your hernia gets larger.  Your hernia becomes painful. Get help right away if:  Your hernia becomes increasingly painful.  You have pain along with any of the following: ? Changes in skin color in the area of the hernia. ? Nausea. ? Vomiting. ? Fever. These symptoms may represent a serious problem that is an emergency. Do not wait to see if the symptoms will go away. Get medical help right away. Call your local emergency services (911 in the [place] Do not drive yourself to the hospital. Summary  A ventral hernia is a bulge of tissue from inside the abdomen that pushes through a weak area of the muscles that form the front wall of the abdomen.  This condition is treated with surgery, which may be urgent depending on your hernia.  Do not lift anything that is heavier than 10 lb (4.5 kg), and follow activity instructions from your health care provider. This information is not intended to replace advice given to you by your health care provider. Make sure you discuss any questions you have with your health care provider. Document Revised: 05/28/2020 Document Reviewed: 05/28/2020 Elsevier Patient Education  2021 [employer].

## 2020-11-10 NOTE — Telephone Encounter (Signed)
Spoke with patient- I let her know [facility] is a walk-in facility-she can go anytime to get registered then begin seeing the doctor-the patient verbalized understanding and stated she would go. I asked her to call the office and let me know when her first appointment will be.

## 2020-11-10 NOTE — Telephone Encounter (Signed)
Left message for patient to return call to office to be informed of the below.   Spoke with [facility] do not accept referrals- walk-ins only-Patient can walk in at any time of the day to complete paperwork and get registered and begin seeing the provider. They offer medication management at this time.

## 2020-11-11 ENCOUNTER — Telehealth: Payer: Self-pay

## 2020-11-11 NOTE — Telephone Encounter (Signed)
Patient called office at this time spoke with Pamala Hurry to let her know she has scheduled an appointment to see the provider at [facility].

## 2020-11-12 NOTE — Progress Notes (Signed)
Carla [name] is a 60 year old female well-known to me with large incisional hernia with loss of domain.  She does have significant comorbidities to include peripheral vascular disease, smoking, COPD, diabetes and a BMI of 43.  She is lost 13 pounds since my last visit and she is going the right direction. He has significant social issues and definitely she is depressed.  She does have chronic pain.  I have sent her to pain clinic unfortunately they deemed her that she was not a candidate for opiates.  She has refused some physical therapy as she is unable to do that  PE morbidly obese female no acute distress she is anxious and she is crying because of her situation Abd: Soft there is a large incisional hernia with loss of domain.  Chronically incarcerated   A/P 60 year old female with symptomatic incisional hernia and multiple comorbidities.  Unfortunately her symptoms are worsening and were getting to the point that we may have to do and proceed with surgery.  Before we do that I have encouraged her to continue to lose weight smoke cessation and also I would like to obtain a psychiatric evaluation.  I am afraid that she may develop complications perioperatively and I will have to optimize her as much as I can. Is not that I spent at least 45 minutes in this encounter with greater than 50% spent in coordination and counseling of her care.  We will see her back after 6 to 8 weeks after she sees psychiatry

## 2020-11-15 ENCOUNTER — Telehealth (INDEPENDENT_AMBULATORY_CARE_PROVIDER_SITE_OTHER): Payer: Self-pay | Admitting: Vascular Surgery

## 2020-11-15 NOTE — Telephone Encounter (Signed)
Patient last seen 08/20/20. Please advise.

## 2020-11-15 NOTE — Telephone Encounter (Signed)
Called stating that the past 3-4 days her right leg has been bothering her. Patient states that the leg is swollen and has become very hard to walk on. Patient was last seen  Patient would like to come in to be seen 07/2920 with abi studies (FB).

## 2020-11-16 NOTE — Telephone Encounter (Signed)
Pt scheduled  

## 2020-11-16 NOTE — Telephone Encounter (Signed)
Please schedule per [place]'s recommendations. Thank you

## 2020-11-16 NOTE — Telephone Encounter (Signed)
She can come in with ABIs and a RLE DVT study..me or dew

## 2020-11-24 ENCOUNTER — Other Ambulatory Visit (INDEPENDENT_AMBULATORY_CARE_PROVIDER_SITE_OTHER): Payer: Self-pay | Admitting: Nurse Practitioner

## 2020-11-24 DIAGNOSIS — M7989 Other specified soft tissue disorders: Secondary | ICD-10-CM

## 2020-11-24 DIAGNOSIS — M79604 Pain in right leg: Secondary | ICD-10-CM

## 2020-11-26 ENCOUNTER — Ambulatory Visit (INDEPENDENT_AMBULATORY_CARE_PROVIDER_SITE_OTHER): Payer: Medicare Other | Admitting: Nurse Practitioner

## 2020-11-26 ENCOUNTER — Encounter (INDEPENDENT_AMBULATORY_CARE_PROVIDER_SITE_OTHER): Payer: Medicare Other

## 2020-12-03 ENCOUNTER — Encounter (INDEPENDENT_AMBULATORY_CARE_PROVIDER_SITE_OTHER): Payer: Self-pay | Admitting: Nurse Practitioner

## 2020-12-03 ENCOUNTER — Ambulatory Visit (INDEPENDENT_AMBULATORY_CARE_PROVIDER_SITE_OTHER): Payer: Medicare Other

## 2020-12-03 ENCOUNTER — Encounter (INDEPENDENT_AMBULATORY_CARE_PROVIDER_SITE_OTHER): Payer: Medicare Other

## 2020-12-03 ENCOUNTER — Ambulatory Visit (INDEPENDENT_AMBULATORY_CARE_PROVIDER_SITE_OTHER): Payer: Medicare Other | Admitting: Nurse Practitioner

## 2020-12-03 ENCOUNTER — Other Ambulatory Visit: Payer: Self-pay

## 2020-12-03 VITALS — BP 130/86 | HR 94 | Resp 16 | Wt 241.2 lb

## 2020-12-03 DIAGNOSIS — R600 Localized edema: Secondary | ICD-10-CM | POA: Diagnosis not present

## 2020-12-03 DIAGNOSIS — M79604 Pain in right leg: Secondary | ICD-10-CM

## 2020-12-03 DIAGNOSIS — M7989 Other specified soft tissue disorders: Secondary | ICD-10-CM

## 2020-12-03 DIAGNOSIS — E785 Hyperlipidemia, unspecified: Secondary | ICD-10-CM | POA: Diagnosis not present

## 2020-12-03 DIAGNOSIS — M792 Neuralgia and neuritis, unspecified: Secondary | ICD-10-CM

## 2020-12-03 DIAGNOSIS — M5137 Other intervertebral disc degeneration, lumbosacral region: Secondary | ICD-10-CM | POA: Diagnosis not present

## 2020-12-03 DIAGNOSIS — I70221 Atherosclerosis of native arteries of extremities with rest pain, right leg: Secondary | ICD-10-CM | POA: Diagnosis not present

## 2020-12-03 MED ORDER — TRAMADOL HCL 50 MG PO TABS: 50.0000 mg | ORAL_TABLET | Freq: Four times a day (QID) | ORAL | 0 refills | Status: DC | PRN

## 2020-12-06 ENCOUNTER — Encounter (INDEPENDENT_AMBULATORY_CARE_PROVIDER_SITE_OTHER): Payer: Self-pay | Admitting: Nurse Practitioner

## 2020-12-06 ENCOUNTER — Telehealth (INDEPENDENT_AMBULATORY_CARE_PROVIDER_SITE_OTHER): Payer: Self-pay

## 2020-12-06 NOTE — Telephone Encounter (Signed)
Spoke with the patient and she is scheduled with Dr. Lucky Cowboy for a RLE angio on 12/13/20 with a 7:45 am arrival time to the [facility]. Covid testing on 12/09/20 between 8-1 pm at the [facility]. Pre-procedure instructions were discussed and will be mailed.

## 2020-12-06 NOTE — H&P (View-Only) (Signed)
Subjective:    Patient ID: Carla [name], female    DOB: [birth year], 60 y.o.   MRN: [id number] Chief Complaint  Patient presents with  . Follow-up    Ultrasound follow up    The patient returns to the office for followup and review of the noninvasive studies. There has been a significant deterioration in the lower extremity symptoms.  The patient notes interval shortening of their claudication distance and development of mild rest pain symptoms. No new ulcers or wounds have occurred since the last visit.  There have been no significant changes to the patient's overall health care.  The patient denies amaurosis fugax or recent TIA symptoms. There are no recent neurological changes noted. The patient denies history of DVT, PE or superficial thrombophlebitis. The patient denies recent episodes of angina or shortness of breath.   ABI's Rt=0.79 and Lt=1.10 (previous ABI's Rt=1.02 and Lt=1.06) Duplex US of the lower extremity arterial system shows biphasic monophasic waveforms bilaterally with strong toe waveforms on the left with dampened on the right.   Review of Systems  Cardiovascular: Positive for leg swelling.       Right leg pain  Musculoskeletal: Positive for back pain.  All other systems reviewed and are negative.      Objective:   Physical Exam Vitals reviewed.  HENT:     Head: Normocephalic.  Cardiovascular:     Rate and Rhythm: Normal rate.  Pulmonary:     Effort: Pulmonary effort is normal.  Neurological:     Mental Status: She is alert and oriented to person, place, and time.     Gait: Gait abnormal.  Psychiatric:        Mood and Affect: Mood is anxious.        Behavior: Behavior normal.        Thought Content: Thought content normal.        Judgment: Judgment normal.     BP 130/86 (BP Location: Right Arm)   Pulse 94   Resp 16   Wt 241 lb 3.2 oz (109.4 kg)   BMI 44.12 kg/m   Past Medical History:  Diagnosis Date  . Abdominal aortic atherosclerosis  ([place])    a. 05/2017 CTA abd/pelvis: significant atherosclerotic dzs of the infrarenal abd Ao w/ some mural thrombus. No aneurysm or dissection.  . Abdominal pain   . Acute focal ischemia of small intestine (HCC)   . Acute right-sided low back pain with right-sided sciatica 06/13/2017  . AKI (acute kidney injury) ([place]) 07/29/2017  . Baker's cyst of knee, right    a. 07/2016 U/S: 4.1 x 1.4 x 2.9 cystic structure in R poplitetal fossa.  . Bell's palsy   . Borderline diabetes   . Cancer ([place])    skin cancer on nose  . Chest pain    a. 08/2012 Lexiscan MV: EF 54%, non ischemia/infarct.  . CHF (congestive heart failure) ([place])   . Chronic idiopathic constipation   . Chronic mesenteric ischemia ([place])   . COPD (chronic obstructive pulmonary disease) ([place])   . Diastolic dysfunction    a. 05/2017 Echo: Ef 60-65%, no rwma, Gr1 DD, no source of cardiac emboli.  . 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[place])    a. 05/2017 CTA Abd/pelvis: apparent thrombus or embolus in prox SMA (70-90%); b. 05/2017 catheter directed tPA, mechanical thrombectomy, and stenting of the SMA.  . Essential hypertension with goal blood pressure less than 140/90 01/19/2016  . Gastroesophageal reflux disease 01/19/2016  . GERD (gastroesophageal  reflux disease)   . H/O colectomy   . History of kidney stones   . Hyperlipidemia   . Hypertension   . Hypotension 07/29/2017  . Morbid obesity with BMI of 40.0-44.9, adult ([place])   . Occlusive mesenteric ischemia ([place]) 06/19/2017  . Personal history of other malignant neoplasm of skin 06/01/2016  . Pre-diabetes   . Primary osteoarthritis of both knees 06/13/2017  . Recurrent major depressive disorder ([place]) 01/19/2016  . SBO (small bowel obstruction) ([place]) 08/06/2017  . 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 Urinary tract infection 01/09/2014    Social History   Socioeconomic History  . Marital status: Single    Spouse name: Not on file  . Number of children: Not on file  .  Years of education: Not on file  . Highest education level: Not on file  Occupational History  . Occupation: Unemployed  Tobacco Use  . Smoking status: Current Some Day Smoker    Packs/day: 0.10    Types: Cigarettes  . Smokeless tobacco: Never Used  Vaping Use  . Vaping Use: Never used  Substance and Sexual Activity  . Alcohol use: No  . Drug use: No  . Sexual activity: Not on file  Other Topics Concern  . Not on file  Social History Narrative   Lives in [place] with boyfriend.  Does not routinely exercise.   Social Determinants of Health   Financial Resource Strain: Not on file  Food Insecurity: Not on file  Transportation Needs: Not on file  Physical Activity: Not on file  Stress: Not on file  Social Connections: Not on file  Intimate Partner Violence: Not on file    Past Surgical History:  Procedure Laterality Date  . AMPUTATION TOE Left 12/09/2019   Procedure: AMPUTATION TOE MPJ left;  Surgeon: Caroline More, DPM;  Location: [facility];  Service: Podiatry;  Laterality: Left;  . APPENDECTOMY    . CHOLECYSTECTOMY    . LAPAROTOMY N/A 08/08/2017   Procedure: EXPLORATORY LAPAROTOMY POSSIBLE BOWEL RESECTION;  Surgeon: Jules Husbands, MD;  Location: [facility];  Service: General;  Laterality: N/A;  . LOWER EXTREMITY ANGIOGRAPHY Left 11/17/2019   Procedure: LOWER EXTREMITY ANGIOGRAPHY;  Surgeon: Algernon Huxley, MD;  Location: [place] [facility];  Service: Cardiovascular;  Laterality: Left;  . LOWER EXTREMITY INTERVENTION N/A 11/19/2019   Procedure: LOWER EXTREMITY INTERVENTION;  Surgeon: Algernon Huxley, MD;  Location: [place] [facility];  Service: Cardiovascular;  Laterality: N/A;  . TEE WITHOUT CARDIOVERSION N/A 06/22/2017   Procedure: TRANSESOPHAGEAL ECHOCARDIOGRAM (TEE);  Surgeon: Wellington Hampshire, MD;  Location: [facility];  Service: Cardiovascular;  Laterality: N/A;  . TOTAL KNEE ARTHROPLASTY Right 07/11/2018   Procedure: TOTAL KNEE ARTHROPLASTY;  Surgeon: Corky Mull, MD;   Location: [facility];  Service: Orthopedics;  Laterality: Right;  [name] VAGINAL HYSTERECTOMY    . VISCERAL ARTERY INTERVENTION N/A 06/20/2017   Procedure: Visceral Artery Intervention, possible aortic thrombectomy;  Surgeon: Algernon Huxley, MD;  Location: [place] [facility];  Service: Cardiovascular;  Laterality: N/A;  . VISCERAL ARTERY INTERVENTION N/A 01/28/2018   Procedure: VISCERAL ARTERY INTERVENTION;  Surgeon: Algernon Huxley, MD;  Location: [place] [facility];  Service: Cardiovascular;  Laterality: N/A;    Family History  Problem Relation Age of Onset  . Hypertension Mother   . Heart disease Mother   . Heart attack Mother   . Breast cancer Mother   . Hypertension Father   . Breast cancer Paternal Aunt  Allergies  Allergen Reactions  . Gabapentin     Tremors   . Bactrim [Sulfamethoxazole-Trimethoprim] Itching    CBC Latest Ref Rng & Units 11/23/2019 11/21/2019 11/20/2019  WBC 4.0 - 10.5 K/uL 6.0 5.4 5.8  Hemoglobin 12.0 - 15.0 g/dL 10.9(L) 10.8(L) 10.8(L)  Hematocrit 36.0 - 46.0 % 34.8(L) 34.3(L) 34.7(L)  Platelets 150 - 400 K/uL 288 210 174      CMP     Component Value Date/Time   NA 139 09/30/2020 1124   NA 140 02/12/2015 1137   K 3.7 09/30/2020 1124   K 2.9 (L) 02/12/2015 1137   CL 103 09/30/2020 1124   CL 107 02/12/2015 1137   CO2 24 09/30/2020 1124   CO2 27 02/12/2015 1137   GLUCOSE 146 (H) 09/30/2020 1124   GLUCOSE 120 (H) 02/12/2015 1137   BUN 10 09/30/2020 1124   BUN 15 02/12/2015 1137   CREATININE 0.83 09/30/2020 1124   CREATININE 0.78 02/12/2015 1137   CALCIUM 9.2 09/30/2020 1124   CALCIUM 9.0 02/12/2015 1137   PROT 7.0 09/30/2020 1124   PROT 7.0 09/14/2020 1139   PROT 7.1 01/19/2015 1112   ALBUMIN 3.3 (L) 09/30/2020 1124   ALBUMIN 3.9 09/14/2020 1139   ALBUMIN 3.8 01/19/2015 1112   AST 15 09/30/2020 1124   AST 17 01/19/2015 1112   ALT 13 09/30/2020 1124   ALT 11 (L) 01/19/2015 1112   ALKPHOS 89 09/30/2020 1124   ALKPHOS 84 01/19/2015 1112    BILITOT 0.5 09/30/2020 1124   BILITOT <0.2 09/14/2020 1139   BILITOT 0.5 01/19/2015 1112   GFRNONAA >60 09/30/2020 1124   GFRNONAA >60 02/12/2015 1137   GFRAA >60 11/23/2019 1330   GFRAA >60 02/12/2015 1137     No results found.     Assessment & Plan:   1. Atherosclerosis of native artery of right lower extremity with rest pain (HCC) Recommend:  The patient has evidence of severe atherosclerotic changes of both lower extremities with rest pain that is associated with preulcerative changes and impending tissue loss of the foot.  This represents a limb threatening ischemia and places the patient at the risk for limb loss.  Patient should undergo angiography of the lower extremities with the hope for intervention for limb salvage.  The risks and benefits as well as the alternative therapies was discussed in detail with the patient.  All questions were answered.  Patient agrees to proceed with angiography.  The patient will follow up with me in the office after the procedure.       2. Neuropathic pain The patient's pain may be related to her lower back or to the current vascular issue.  Patient is currently on neuropathic medications.  She will continue to follow with pain management and neurology.  3. Hyperlipidemia, unspecified hyperlipidemia type Continue statin as ordered and reviewed, no changes at this time   4. DDD (degenerative disc disease), lumbosacral The patient does have significant lower back issues including scoliosis.  If the patient continues to have pain following intervention, lower back causes should be explored.   Current Outpatient Medications on File Prior to Visit  Medication Sig Dispense Refill  . albuterol (VENTOLIN HFA) 108 (90 Base) MCG/ACT inhaler Inhale 2 puffs into the lungs every 6 (six) hours as needed for wheezing or shortness of breath.     . Alpha-Lipoic Acid 600 MG CAPS Take 1 capsule (600 mg total) by mouth daily. 30 capsule 2  .  amitriptyline (ELAVIL) 25 MG tablet Take  25 mg by mouth at bedtime.    [name] amLODipine (NORVASC) 5 MG tablet Take 1 tablet (5 mg total) by mouth daily. 30 tablet 2  . apixaban (ELIQUIS) 5 MG TABS tablet Take 5 mg by mouth 2 (two) times daily.    [name] aspirin EC 81 MG tablet Take 81 mg by mouth daily. Swallow whole.    [name] atorvastatin (LIPITOR) 80 MG tablet Take 80 mg by mouth at bedtime.     . ferrous sulfate 325 (65 FE) MG tablet Take 325 mg by mouth daily.    . furosemide (LASIX) 40 MG tablet Take by mouth.    . Ipratropium-Albuterol (COMBIVENT) 20-100 MCG/ACT AERS respimat Inhale into the lungs.    . metoprolol tartrate (LOPRESSOR) 50 MG tablet Take 1 tablet (50 mg total) by mouth 2 (two) times daily. 30 tablet 0  . Multiple Vitamin (MULTIVITAMIN WITH MINERALS) TABS tablet Take 1 tablet by mouth daily. 90 tablet 3  . omeprazole (PRILOSEC) 20 MG capsule Take 20 mg by mouth 2 (two) times daily before a meal.    . pregabalin (LYRICA) 200 MG capsule Take 1 capsule (200 mg total) by mouth 2 (two) times daily. 90 capsule 0  . buPROPion (WELLBUTRIN XL) 150 MG 24 hr tablet Take 150 mg by mouth daily.     . cyclobenzaprine (FLEXERIL) 5 MG tablet Take 1 tablet (5 mg total) by mouth 3 (three) times daily as needed for muscle spasms. (Patient not taking: Reported on 12/03/2020) 60 tablet 0  . DULoxetine (CYMBALTA) 60 MG capsule Take 60 mg by mouth daily. (Patient not taking: Reported on 12/03/2020)    . ondansetron (ZOFRAN) 4 MG tablet  (Patient not taking: No sig reported)    . oxybutynin (DITROPAN-XL) 10 MG 24 hr tablet Take 10 mg by mouth daily. (Patient not taking: No sig reported)    . potassium chloride (KLOR-CON) 10 MEQ tablet Take by mouth. (Patient not taking: No sig reported)    . topiramate (TOPAMAX) 100 MG tablet Take 100 mg by mouth 2 (two) times daily. (Patient not taking: No sig reported)    . torsemide (DEMADEX) 20 MG tablet Take 20 mg by mouth daily. (Patient not taking: No sig reported)     No  current facility-administered medications on file prior to visit.    There are no Patient Instructions on file for this visit. No follow-ups on file.   Kris Hartmann, NP

## 2020-12-06 NOTE — Progress Notes (Signed)
Subjective:    Patient ID: Carla [name], female    DOB: 27-Oct-1960, 60 y.o.   MRN: [id number] Chief Complaint  Patient presents with  . Follow-up    Ultrasound follow up    The patient returns to the office for followup and review of the noninvasive studies. There has been a significant deterioration in the lower extremity symptoms.  The patient notes interval shortening of their claudication distance and development of mild rest pain symptoms. No new ulcers or wounds have occurred since the last visit.  There have been no significant changes to the patient's overall health care.  The patient denies amaurosis fugax or recent TIA symptoms. There are no recent neurological changes noted. The patient denies history of DVT, PE or superficial thrombophlebitis. The patient denies recent episodes of angina or shortness of breath.   ABI's Rt=0.79 and Lt=1.10 (previous ABI's Rt=1.02 and Lt=1.06) Duplex US of the lower extremity arterial system shows biphasic monophasic waveforms bilaterally with strong toe waveforms on the left with dampened on the right.   Review of Systems  Cardiovascular: Positive for leg swelling.       Right leg pain  Musculoskeletal: Positive for back pain.  All other systems reviewed and are negative.      Objective:   Physical Exam Vitals reviewed.  HENT:     Head: Normocephalic.  Cardiovascular:     Rate and Rhythm: Normal rate.  Pulmonary:     Effort: Pulmonary effort is normal.  Neurological:     Mental Status: She is alert and oriented to person, place, and time.     Gait: Gait abnormal.  Psychiatric:        Mood and Affect: Mood is anxious.        Behavior: Behavior normal.        Thought Content: Thought content normal.        Judgment: Judgment normal.     BP 130/86 (BP Location: Right Arm)   Pulse 94   Resp 16   Wt 241 lb 3.2 oz (109.4 kg)   BMI 44.12 kg/m   Past Medical History:  Diagnosis Date  . Abdominal aortic atherosclerosis  ([place])    a. 05/2017 CTA abd/pelvis: significant atherosclerotic dzs of the infrarenal abd Ao w/ some mural thrombus. No aneurysm or dissection.  . Abdominal pain   . Acute focal ischemia of small intestine (HCC)   . Acute right-sided low back pain with right-sided sciatica 06/13/2017  . AKI (acute kidney injury) ([place]) 07/29/2017  . Baker's cyst of knee, right    a. 07/2016 U/S: 4.1 x 1.4 x 2.9 cystic structure in R poplitetal fossa.  . Bell's palsy   . Borderline diabetes   . Cancer ([place])    skin cancer on nose  . Chest pain    a. 08/2012 Lexiscan MV: EF 54%, non ischemia/infarct.  . CHF (congestive heart failure) ([place])   . Chronic idiopathic constipation   . Chronic mesenteric ischemia ([place])   . COPD (chronic obstructive pulmonary disease) ([place])   . Diastolic dysfunction    a. 05/2017 Echo: Ef 60-65%, no rwma, Gr1 DD, no source of cardiac emboli.  . 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[place])    a. 05/2017 CTA Abd/pelvis: apparent thrombus or embolus in prox SMA (70-90%); b. 05/2017 catheter directed tPA, mechanical thrombectomy, and stenting of the SMA.  . Essential hypertension with goal blood pressure less than 140/90 01/19/2016  . Gastroesophageal reflux disease 01/19/2016  . GERD (gastroesophageal  reflux disease)   . H/O colectomy   . History of kidney stones   . Hyperlipidemia   . Hypertension   . Hypotension 07/29/2017  . Morbid obesity with BMI of 40.0-44.9, adult ([place])   . Occlusive mesenteric ischemia ([place]) 06/19/2017  . Personal history of other malignant neoplasm of skin 06/01/2016  . Pre-diabetes   . Primary osteoarthritis of both knees 06/13/2017  . Recurrent major depressive disorder ([place]) 01/19/2016  . SBO (small bowel obstruction) ([place]) 08/06/2017  . 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 Urinary tract infection 01/09/2014    Social History   Socioeconomic History  . Marital status: Single    Spouse name: Not on file  . Number of children: Not on file  .  Years of education: Not on file  . Highest education level: Not on file  Occupational History  . Occupation: Unemployed  Tobacco Use  . Smoking status: Current Some Day Smoker    Packs/day: 0.10    Types: Cigarettes  . Smokeless tobacco: Never Used  Vaping Use  . Vaping Use: Never used  Substance and Sexual Activity  . Alcohol use: No  . Drug use: No  . Sexual activity: Not on file  Other Topics Concern  . Not on file  Social History Narrative   Lives in [place] with boyfriend.  Does not routinely exercise.   Social Determinants of Health   Financial Resource Strain: Not on file  Food Insecurity: Not on file  Transportation Needs: Not on file  Physical Activity: Not on file  Stress: Not on file  Social Connections: Not on file  Intimate Partner Violence: Not on file    Past Surgical History:  Procedure Laterality Date  . AMPUTATION TOE Left 12/09/2019   Procedure: AMPUTATION TOE MPJ left;  Surgeon: Caroline More, DPM;  Location: [facility];  Service: Podiatry;  Laterality: Left;  . APPENDECTOMY    . CHOLECYSTECTOMY    . LAPAROTOMY N/A 08/08/2017   Procedure: EXPLORATORY LAPAROTOMY POSSIBLE BOWEL RESECTION;  Surgeon: Jules Husbands, MD;  Location: [facility];  Service: General;  Laterality: N/A;  . LOWER EXTREMITY ANGIOGRAPHY Left 11/17/2019   Procedure: LOWER EXTREMITY ANGIOGRAPHY;  Surgeon: Algernon Huxley, MD;  Location: [place] [facility];  Service: Cardiovascular;  Laterality: Left;  . LOWER EXTREMITY INTERVENTION N/A 11/19/2019   Procedure: LOWER EXTREMITY INTERVENTION;  Surgeon: Algernon Huxley, MD;  Location: [place] [facility];  Service: Cardiovascular;  Laterality: N/A;  . TEE WITHOUT CARDIOVERSION N/A 06/22/2017   Procedure: TRANSESOPHAGEAL ECHOCARDIOGRAM (TEE);  Surgeon: Wellington Hampshire, MD;  Location: [facility];  Service: Cardiovascular;  Laterality: N/A;  . TOTAL KNEE ARTHROPLASTY Right 07/11/2018   Procedure: TOTAL KNEE ARTHROPLASTY;  Surgeon: Corky Mull, MD;   Location: [facility];  Service: Orthopedics;  Laterality: Right;  [name] VAGINAL HYSTERECTOMY    . VISCERAL ARTERY INTERVENTION N/A 06/20/2017   Procedure: Visceral Artery Intervention, possible aortic thrombectomy;  Surgeon: Algernon Huxley, MD;  Location: [place] [facility];  Service: Cardiovascular;  Laterality: N/A;  . VISCERAL ARTERY INTERVENTION N/A 01/28/2018   Procedure: VISCERAL ARTERY INTERVENTION;  Surgeon: Algernon Huxley, MD;  Location: [place] [facility];  Service: Cardiovascular;  Laterality: N/A;    Family History  Problem Relation Age of Onset  . Hypertension Mother   . Heart disease Mother   . Heart attack Mother   . Breast cancer Mother   . Hypertension Father   . Breast cancer Paternal Aunt  Allergies  Allergen Reactions  . Gabapentin     Tremors   . Bactrim [Sulfamethoxazole-Trimethoprim] Itching    CBC Latest Ref Rng & Units 11/23/2019 11/21/2019 11/20/2019  WBC 4.0 - 10.5 K/uL 6.0 5.4 5.8  Hemoglobin 12.0 - 15.0 g/dL 10.9(L) 10.8(L) 10.8(L)  Hematocrit 36.0 - 46.0 % 34.8(L) 34.3(L) 34.7(L)  Platelets 150 - 400 K/uL 288 210 174      CMP     Component Value Date/Time   NA 139 09/30/2020 1124   NA 140 02/12/2015 1137   K 3.7 09/30/2020 1124   K 2.9 (L) 02/12/2015 1137   CL 103 09/30/2020 1124   CL 107 02/12/2015 1137   CO2 24 09/30/2020 1124   CO2 27 02/12/2015 1137   GLUCOSE 146 (H) 09/30/2020 1124   GLUCOSE 120 (H) 02/12/2015 1137   BUN 10 09/30/2020 1124   BUN 15 02/12/2015 1137   CREATININE 0.83 09/30/2020 1124   CREATININE 0.78 02/12/2015 1137   CALCIUM 9.2 09/30/2020 1124   CALCIUM 9.0 02/12/2015 1137   PROT 7.0 09/30/2020 1124   PROT 7.0 09/14/2020 1139   PROT 7.1 01/19/2015 1112   ALBUMIN 3.3 (L) 09/30/2020 1124   ALBUMIN 3.9 09/14/2020 1139   ALBUMIN 3.8 01/19/2015 1112   AST 15 09/30/2020 1124   AST 17 01/19/2015 1112   ALT 13 09/30/2020 1124   ALT 11 (L) 01/19/2015 1112   ALKPHOS 89 09/30/2020 1124   ALKPHOS 84 01/19/2015 1112    BILITOT 0.5 09/30/2020 1124   BILITOT <0.2 09/14/2020 1139   BILITOT 0.5 01/19/2015 1112   GFRNONAA >60 09/30/2020 1124   GFRNONAA >60 02/12/2015 1137   GFRAA >60 11/23/2019 1330   GFRAA >60 02/12/2015 1137     No results found.     Assessment & Plan:   1. Atherosclerosis of native artery of right lower extremity with rest pain (HCC) Recommend:  The patient has evidence of severe atherosclerotic changes of both lower extremities with rest pain that is associated with preulcerative changes and impending tissue loss of the foot.  This represents a limb threatening ischemia and places the patient at the risk for limb loss.  Patient should undergo angiography of the lower extremities with the hope for intervention for limb salvage.  The risks and benefits as well as the alternative therapies was discussed in detail with the patient.  All questions were answered.  Patient agrees to proceed with angiography.  The patient will follow up with me in the office after the procedure.       2. Neuropathic pain The patient's pain may be related to her lower back or to the current vascular issue.  Patient is currently on neuropathic medications.  She will continue to follow with pain management and neurology.  3. Hyperlipidemia, unspecified hyperlipidemia type Continue statin as ordered and reviewed, no changes at this time   4. DDD (degenerative disc disease), lumbosacral The patient does have significant lower back issues including scoliosis.  If the patient continues to have pain following intervention, lower back causes should be explored.   Current Outpatient Medications on File Prior to Visit  Medication Sig Dispense Refill  . albuterol (VENTOLIN HFA) 108 (90 Base) MCG/ACT inhaler Inhale 2 puffs into the lungs every 6 (six) hours as needed for wheezing or shortness of breath.     . Alpha-Lipoic Acid 600 MG CAPS Take 1 capsule (600 mg total) by mouth daily. 30 capsule 2  .  amitriptyline (ELAVIL) 25 MG tablet Take  25 mg by mouth at bedtime.    [name] amLODipine (NORVASC) 5 MG tablet Take 1 tablet (5 mg total) by mouth daily. 30 tablet 2  . apixaban (ELIQUIS) 5 MG TABS tablet Take 5 mg by mouth 2 (two) times daily.    [name] aspirin EC 81 MG tablet Take 81 mg by mouth daily. Swallow whole.    [name] atorvastatin (LIPITOR) 80 MG tablet Take 80 mg by mouth at bedtime.     . ferrous sulfate 325 (65 FE) MG tablet Take 325 mg by mouth daily.    . furosemide (LASIX) 40 MG tablet Take by mouth.    . Ipratropium-Albuterol (COMBIVENT) 20-100 MCG/ACT AERS respimat Inhale into the lungs.    . metoprolol tartrate (LOPRESSOR) 50 MG tablet Take 1 tablet (50 mg total) by mouth 2 (two) times daily. 30 tablet 0  . Multiple Vitamin (MULTIVITAMIN WITH MINERALS) TABS tablet Take 1 tablet by mouth daily. 90 tablet 3  . omeprazole (PRILOSEC) 20 MG capsule Take 20 mg by mouth 2 (two) times daily before a meal.    . pregabalin (LYRICA) 200 MG capsule Take 1 capsule (200 mg total) by mouth 2 (two) times daily. 90 capsule 0  . buPROPion (WELLBUTRIN XL) 150 MG 24 hr tablet Take 150 mg by mouth daily.     . cyclobenzaprine (FLEXERIL) 5 MG tablet Take 1 tablet (5 mg total) by mouth 3 (three) times daily as needed for muscle spasms. (Patient not taking: Reported on 12/03/2020) 60 tablet 0  . DULoxetine (CYMBALTA) 60 MG capsule Take 60 mg by mouth daily. (Patient not taking: Reported on 12/03/2020)    . ondansetron (ZOFRAN) 4 MG tablet  (Patient not taking: No sig reported)    . oxybutynin (DITROPAN-XL) 10 MG 24 hr tablet Take 10 mg by mouth daily. (Patient not taking: No sig reported)    . potassium chloride (KLOR-CON) 10 MEQ tablet Take by mouth. (Patient not taking: No sig reported)    . topiramate (TOPAMAX) 100 MG tablet Take 100 mg by mouth 2 (two) times daily. (Patient not taking: No sig reported)    . torsemide (DEMADEX) 20 MG tablet Take 20 mg by mouth daily. (Patient not taking: No sig reported)     No  current facility-administered medications on file prior to visit.    There are no Patient Instructions on file for this visit. No follow-ups on file.   Kris Hartmann, NP

## 2020-12-09 ENCOUNTER — Other Ambulatory Visit
Admission: RE | Admit: 2020-12-09 | Discharge: 2020-12-09 | Disposition: A | Discharge status: Home / Self Care | Payer: Medicare Other | Source: Ambulatory Visit | Attending: Vascular Surgery | Admitting: Vascular Surgery

## 2020-12-10 ENCOUNTER — Other Ambulatory Visit: Payer: Self-pay

## 2020-12-10 ENCOUNTER — Other Ambulatory Visit
Admission: RE | Admit: 2020-12-10 | Discharge: 2020-12-10 | Disposition: A | Discharge status: Home / Self Care | Payer: Medicare Other | Source: Ambulatory Visit | Attending: Vascular Surgery | Admitting: Vascular Surgery

## 2020-12-10 DIAGNOSIS — Z20822 Contact with and (suspected) exposure to covid-19: Secondary | ICD-10-CM | POA: Diagnosis not present

## 2020-12-10 DIAGNOSIS — Z01812 Encounter for preprocedural laboratory examination: Secondary | ICD-10-CM | POA: Diagnosis present

## 2020-12-11 LAB — SARS CORONAVIRUS 2 (TAT 6-24 HRS): SARS Coronavirus 2: NEGATIVE

## 2020-12-13 ENCOUNTER — Ambulatory Visit
Admission: RE | Admit: 2020-12-13 | Discharge: 2020-12-13 | Disposition: A | Discharge status: Home / Self Care | Payer: Medicare Other | Attending: Vascular Surgery | Admitting: Vascular Surgery

## 2020-12-13 ENCOUNTER — Encounter
Admission: RE | Disposition: A | Discharge status: Home / Self Care | Payer: Self-pay | Source: Home / Self Care | Attending: Vascular Surgery

## 2020-12-13 ENCOUNTER — Other Ambulatory Visit: Payer: Self-pay

## 2020-12-13 ENCOUNTER — Encounter: Payer: Self-pay | Admitting: Vascular Surgery

## 2020-12-13 ENCOUNTER — Other Ambulatory Visit (INDEPENDENT_AMBULATORY_CARE_PROVIDER_SITE_OTHER): Payer: Self-pay | Admitting: Nurse Practitioner

## 2020-12-13 DIAGNOSIS — Z7901 Long term (current) use of anticoagulants: Secondary | ICD-10-CM | POA: Insufficient documentation

## 2020-12-13 DIAGNOSIS — Z888 Allergy status to other drugs, medicaments and biological substances status: Secondary | ICD-10-CM | POA: Diagnosis not present

## 2020-12-13 DIAGNOSIS — Z7982 Long term (current) use of aspirin: Secondary | ICD-10-CM | POA: Diagnosis not present

## 2020-12-13 DIAGNOSIS — Z85828 Personal history of other malignant neoplasm of skin: Secondary | ICD-10-CM | POA: Diagnosis not present

## 2020-12-13 DIAGNOSIS — Z95828 Presence of other vascular implants and grafts: Secondary | ICD-10-CM | POA: Diagnosis not present

## 2020-12-13 DIAGNOSIS — N179 Acute kidney failure, unspecified: Secondary | ICD-10-CM | POA: Diagnosis not present

## 2020-12-13 DIAGNOSIS — I70219 Atherosclerosis of native arteries of extremities with intermittent claudication, unspecified extremity: Secondary | ICD-10-CM

## 2020-12-13 DIAGNOSIS — G629 Polyneuropathy, unspecified: Secondary | ICD-10-CM | POA: Insufficient documentation

## 2020-12-13 DIAGNOSIS — I110 Hypertensive heart disease with heart failure: Secondary | ICD-10-CM | POA: Insufficient documentation

## 2020-12-13 DIAGNOSIS — Z89422 Acquired absence of other left toe(s): Secondary | ICD-10-CM | POA: Diagnosis not present

## 2020-12-13 DIAGNOSIS — Z881 Allergy status to other antibiotic agents status: Secondary | ICD-10-CM | POA: Insufficient documentation

## 2020-12-13 DIAGNOSIS — M5137 Other intervertebral disc degeneration, lumbosacral region: Secondary | ICD-10-CM | POA: Diagnosis not present

## 2020-12-13 DIAGNOSIS — Z9049 Acquired absence of other specified parts of digestive tract: Secondary | ICD-10-CM | POA: Insufficient documentation

## 2020-12-13 DIAGNOSIS — I5030 Unspecified diastolic (congestive) heart failure: Secondary | ICD-10-CM | POA: Insufficient documentation

## 2020-12-13 DIAGNOSIS — Z8249 Family history of ischemic heart disease and other diseases of the circulatory system: Secondary | ICD-10-CM | POA: Insufficient documentation

## 2020-12-13 DIAGNOSIS — Z79899 Other long term (current) drug therapy: Secondary | ICD-10-CM | POA: Insufficient documentation

## 2020-12-13 DIAGNOSIS — E785 Hyperlipidemia, unspecified: Secondary | ICD-10-CM | POA: Diagnosis not present

## 2020-12-13 DIAGNOSIS — Z6841 Body Mass Index (BMI) 40.0 and over, adult: Secondary | ICD-10-CM | POA: Insufficient documentation

## 2020-12-13 DIAGNOSIS — F17210 Nicotine dependence, cigarettes, uncomplicated: Secondary | ICD-10-CM | POA: Insufficient documentation

## 2020-12-13 DIAGNOSIS — Z90710 Acquired absence of both cervix and uterus: Secondary | ICD-10-CM | POA: Insufficient documentation

## 2020-12-13 DIAGNOSIS — I70221 Atherosclerosis of native arteries of extremities with rest pain, right leg: Secondary | ICD-10-CM | POA: Insufficient documentation

## 2020-12-13 DIAGNOSIS — Z803 Family history of malignant neoplasm of breast: Secondary | ICD-10-CM | POA: Insufficient documentation

## 2020-12-13 DIAGNOSIS — Z96651 Presence of right artificial knee joint: Secondary | ICD-10-CM | POA: Diagnosis not present

## 2020-12-13 DIAGNOSIS — R7303 Prediabetes: Secondary | ICD-10-CM | POA: Diagnosis not present

## 2020-12-13 HISTORY — PX: LOWER EXTREMITY ANGIOGRAPHY: CATH118251

## 2020-12-13 LAB — CREATININE, SERUM
Creatinine, Ser: 0.88 mg/dL (ref 0.44–1.00)
GFR, Estimated: >60 mL/min (ref >60)

## 2020-12-13 LAB — GLUCOSE, CAPILLARY: Glucose-Capillary: 101 mg/dL — ABNORMAL HIGH (ref 70–99)

## 2020-12-13 LAB — BUN: BUN: 12 mg/dL (ref 8–23)

## 2020-12-13 SURGERY — LOWER EXTREMITY ANGIOGRAPHY
Anesthesia: Moderate Sedation | Site: Leg Lower | Laterality: Right

## 2020-12-13 MED ORDER — FAMOTIDINE 20 MG PO TABS: 40.0000 mg | ORAL_TABLET | Freq: Once | ORAL | Status: DC | PRN

## 2020-12-13 MED ORDER — MIDAZOLAM HCL 2 MG/2ML IJ SOLN
INTRAMUSCULAR | Status: DC | PRN
  Administered 2020-12-13: 1 mg via INTRAVENOUS

## 2020-12-13 MED ORDER — FENTANYL CITRATE (PF) 100 MCG/2ML IJ SOLN
INTRAMUSCULAR | Status: AC
  Administered 2020-12-13: 50 ug
  Filled 2020-12-13: qty 2

## 2020-12-13 MED ORDER — SODIUM CHLORIDE 0.9% FLUSH: 3.0000 mL | Freq: Two times a day (BID) | INTRAVENOUS | Status: DC

## 2020-12-13 MED ORDER — CEFAZOLIN SODIUM-DEXTROSE 2-4 GM/100ML-% IV SOLN
2.0000 g | Freq: Once | INTRAVENOUS | Status: AC
  Administered 2020-12-13: 2 g via INTRAVENOUS

## 2020-12-13 MED ORDER — MIDAZOLAM HCL 5 MG/5ML IJ SOLN
INTRAMUSCULAR | Status: AC
  Administered 2020-12-13: 2 mg
  Filled 2020-12-13: qty 5

## 2020-12-13 MED ORDER — SODIUM CHLORIDE 0.9 % IV SOLN: INTRAVENOUS | Status: DC

## 2020-12-13 MED ORDER — CEFAZOLIN SODIUM-DEXTROSE 2-4 GM/100ML-% IV SOLN
INTRAVENOUS | Status: AC
  Filled 2020-12-13: qty 100

## 2020-12-13 MED ORDER — SODIUM CHLORIDE 0.9% FLUSH: 3.0000 mL | INTRAVENOUS | Status: DC | PRN

## 2020-12-13 MED ORDER — ACETAMINOPHEN 325 MG PO TABS: 650.0000 mg | ORAL_TABLET | ORAL | Status: DC | PRN

## 2020-12-13 MED ORDER — METHYLPREDNISOLONE SODIUM SUCC 125 MG IJ SOLR: 125.0000 mg | Freq: Once | INTRAMUSCULAR | Status: DC | PRN

## 2020-12-13 MED ORDER — HEPARIN SODIUM (PORCINE) 1000 UNIT/ML IJ SOLN
INTRAMUSCULAR | Status: DC | PRN
  Administered 2020-12-13: 5000 [IU] via INTRAVENOUS

## 2020-12-13 MED ORDER — ONDANSETRON HCL 4 MG/2ML IJ SOLN: 4.0000 mg | Freq: Four times a day (QID) | INTRAMUSCULAR | Status: DC | PRN

## 2020-12-13 MED ORDER — IODIXANOL 320 MG/ML IV SOLN
INTRAVENOUS | Status: DC | PRN
  Administered 2020-12-13: 50 mL

## 2020-12-13 MED ORDER — HEPARIN SODIUM (PORCINE) 1000 UNIT/ML IJ SOLN
INTRAMUSCULAR | Status: AC
  Filled 2020-12-13: qty 1

## 2020-12-13 MED ORDER — LABETALOL HCL 5 MG/ML IV SOLN: 10.0000 mg | INTRAVENOUS | Status: DC | PRN

## 2020-12-13 MED ORDER — MIDAZOLAM HCL 2 MG/ML PO SYRP: 8.0000 mg | ORAL_SOLUTION | Freq: Once | ORAL | Status: DC | PRN

## 2020-12-13 MED ORDER — DIPHENHYDRAMINE HCL 50 MG/ML IJ SOLN: 50.0000 mg | Freq: Once | INTRAMUSCULAR | Status: DC | PRN

## 2020-12-13 MED ORDER — HYDROMORPHONE HCL 1 MG/ML IJ SOLN: 1.0000 mg | Freq: Once | INTRAMUSCULAR | Status: DC | PRN

## 2020-12-13 MED ORDER — FENTANYL CITRATE (PF) 100 MCG/2ML IJ SOLN
INTRAMUSCULAR | Status: DC | PRN
  Administered 2020-12-13: 50 ug via INTRAVENOUS

## 2020-12-13 MED ORDER — HYDRALAZINE HCL 20 MG/ML IJ SOLN: 5.0000 mg | INTRAMUSCULAR | Status: DC | PRN

## 2020-12-13 MED ORDER — SODIUM CHLORIDE 0.9 % IV SOLN: 250.0000 mL | INTRAVENOUS | Status: DC | PRN

## 2020-12-13 SURGICAL SUPPLY — 20 items
BALLN ULTRVRSE 7X60X75C (BALLOONS) ×2
BALLN ULTRVRSE 7X80X75 (BALLOONS) ×2
BALLOON ULTRVRSE 7X60X75C (BALLOONS) ×1 IMPLANT
BALLOON ULTRVRSE 7X80X75 (BALLOONS) ×1 IMPLANT
CANISTER PENUMBRA ENGINE (MISCELLANEOUS) ×2 IMPLANT
CANNULA 5F STIFF (CANNULA) ×2 IMPLANT
CATH ANGIO 5F PIGTAIL 65CM (CATHETERS) ×2 IMPLANT
CATH BEACON 5 .035 40 KMP TP (CATHETERS) ×1 IMPLANT
CATH BEACON 5 .038 40 KMP TP (CATHETERS) ×1
CATH INDIGO 7D KIT (CATHETERS) ×2 IMPLANT
COVER PROBE U/S 5X48 (MISCELLANEOUS) ×4 IMPLANT
DEVICE STARCLOSE SE CLOSURE (Vascular Products) ×4 IMPLANT
GLIDEWIRE ADV .035X180CM (WIRE) ×4 IMPLANT
KIT ENCORE 26 ADVANTAGE (KITS) ×4 IMPLANT
PACK ANGIOGRAPHY (CUSTOM PROCEDURE TRAY) ×2 IMPLANT
SHEATH BRITE TIP 5FRX11 (SHEATH) ×2 IMPLANT
SHEATH BRITE TIP 7FRX11 (SHEATH) ×2 IMPLANT
SYR MEDRAD MARK 7 150ML (SYRINGE) ×2 IMPLANT
TUBING CONTRAST HIGH PRESS 72 (TUBING) ×2 IMPLANT
WIRE GUIDERIGHT .035X150 (WIRE) ×2 IMPLANT

## 2020-12-13 NOTE — Op Note (Signed)
[facility]  Percutaneous Study/Intervention Procedural Note   Date of Surgery: 12/13/2020  Surgeon(s):Ellington Greenslade    Assistants:none  Pre-operative Diagnosis: PAD with rest pain   Post-operative diagnosis:  Same  Procedure(s) Performed:             1.  Ultrasound guidance for vascular access bilateral femoral arteries             2.  Catheter placement into aorta from bilateral femoral approach             3.  Aortogram and selective right lower extremity angiogram             4.  Percutaneous transluminal angioplasty of left common iliac artery 7 mm diameter by 6 cm length angioplasty balloon             5.   Mechanical thrombectomy to the right common and external iliac arteries with the penumbra CAT 7D catheter  6.  Percutaneous transluminal angioplasty of the right common and external iliac arteries with 2 inflations with a 7 mm diameter by 8 cm length angioplasty balloon             7.  StarClose closure device bilateral femoral arteries  EBL: 150 cc{  Contrast: 50 cc  Fluoro Time: 3.1 minutes  Moderate Conscious Sedation Time: approximately 45 minutes using 3 mg of Versed and 100 mcg of Fentanyl              Indications:  Patient is a 60 y.o.female with recurrent rest pain in the right leg. The patient has noninvasive study showing reduction of the right ABI. The patient is brought in for angiography for further evaluation and potential treatment.  Due to the limb threatening nature of the situation, angiogram was performed for attempted limb salvage. The patient is aware that if the procedure fails, amputation would be expected.  The patient also understands that even with successful revascularization, amputation may still be required due to the severity of the situation.  Risks and benefits are discussed and informed consent is obtained.   Procedure:  The patient was identified and appropriate procedural time out was performed.  The patient was then  placed supine on the table and prepped and draped in the usual sterile fashion. Moderate conscious sedation was administered during a face to face encounter with the patient throughout the procedure with my supervision of the RN administering medicines and monitoring the patient's vital signs, pulse oximetry, telemetry and mental status throughout from the start of the procedure until the patient was taken to the recovery room. Ultrasound was used to evaluate the left common femoral artery.  It was patent .  A digital ultrasound image was acquired.  A Seldinger needle was used to access the left common femoral artery under direct ultrasound guidance and a permanent image was performed.  A 0.035 J wire was advanced without resistance and a 5Fr sheath was placed.  Pigtail catheter was placed into the aorta and an AP aortogram was performed. This demonstrated normal renal arteries with an aorta which was fairly normal down to the distal aspect.  The right iliac stents were occluded with reconstitution of the right femoral artery.  The left iliac stents had mild irregularity proximally but the stents went up into the distal aorta and we would have to protect the left side and treating the right side.  It was clear that we would have to access the right side at this point  as well.  The right femoral artery was visualized with ultrasound and found to be patent.  It was then accessed under direct ultrasound guidance without difficulty with a micropuncture needle and a permanent image was recorded.  A micropuncture wire and sheath were then placed and we upsized to a 7 French sheath and heparinized the patient.  On the left side, put up a 7 mm diameter by 6 cm length angioplasty balloon in the common iliac artery to remove any hyperplasia at this location as well as to protect this from any thrombotic material coming down the left side when we perform thrombectomy.  On the right side, I began with the penumbra CAT 7D  catheter I made 2 passes of mechanical thrombectomy in the right external and common iliac arteries.  Significant chunks of thrombus were removed.  I then placed a 7 mm diameter by 8 cm length angioplasty balloon up the right side and did kissing balloon inflations in the distal aorta and proximal bilateral common iliac arteries both up to 10 atm for 1 minute.  The balloon was deflated on the left, and then the right-sided balloon was pulled down to the external iliac artery to treat the entirety of the stented area and a second inflation to 10 atm for 1 minute was done in the right external iliac artery.  Completion imaging following this showed no significant residual thrombus with mild residual irregularity in the right iliac stents of less than 15% and less than 10% irregularity on the left side.  Through the right femoral sheath, perform right lower extremity angiography.  This demonstrated fairly normal common femoral artery, superficial femoral artery, and popliteal artery although this was difficult to see behind the knee because of her knee replacement.  The flow through this area was brisk.  There was then a typical tibial trifurcation with a dominant anterior tibial runoff into the foot.  The posterior tibial artery was small and appeared to occlude at the ankle as did the peroneal artery. I elected to terminate the procedure. The sheath was removed and StarClose closure device was deployed in the left femoral artery with excellent hemostatic result.  Similarly, the sheath was removed on the right side and StarClose closure device deployed in the right femoral artery with excellent hemostatic result.  The patient was taken to the recovery room in stable condition having tolerated the procedure well.  Findings:               Aortogram:  This demonstrated normal renal arteries with an aorta which was fairly normal down to the distal aspect.  The right iliac stents were occluded with reconstitution of the  right femoral artery.  The left iliac stents had mild irregularity proximally but the stents went up into the distal aorta and we would have to protect the left side and treating the right side.             Right lower Extremity:   This demonstrated fairly normal common femoral artery, superficial femoral artery, and popliteal artery although this was difficult to see behind the knee because of her knee replacement.  The flow through this area was brisk.  There was then a typical tibial trifurcation with a dominant anterior tibial runoff into the foot.  The posterior tibial artery was small and appeared to occlude at the ankle as did the peroneal artery   Disposition: Patient was taken to the recovery room in stable condition having tolerated the procedure well.  Complications: None  [name] 12/13/2020 9:38 AM   This note was created with Dragon Medical transcription system. Any errors in dictation are purely unintentional.

## 2020-12-13 NOTE — Discharge Instructions (Signed)
Moderate Conscious Sedation, Adult, Care After This sheet gives you information about how to care for yourself after your procedure. Your health care provider may also give you more specific instructions. If you have problems or questions, contact your health care provider. What can I expect after the procedure? After the procedure, it is common to have:  Sleepiness for several hours.  Impaired judgment for several hours.  Difficulty with balance.  Vomiting if you eat too soon. Follow these instructions at home: For the time period you were told by your health care provider:  Rest.  Do not participate in activities where you could fall or become injured.  Do not drive or use machinery.  Do not drink alcohol.  Do not take sleeping pills or medicines that cause drowsiness.  Do not make important decisions or sign legal documents.  Do not take care of children on your own.      Eating and drinking  Follow the diet recommended by your health care provider.  Drink enough fluid to keep your urine pale yellow.  If you vomit: ? Drink water, juice, or soup when you can drink without vomiting. ? Make sure you have little or no nausea before eating solid foods.   General instructions  Take over-the-counter and prescription medicines only as told by your health care provider.  Have a responsible adult stay with you for the time you are told. It is important to have someone help care for you until you are awake and alert.  Do not smoke.  Keep all follow-up visits as told by your health care provider. This is important. Contact a health care provider if:  You are still sleepy or having trouble with balance after 24 hours.  You feel light-headed.  You keep feeling nauseous or you keep vomiting.  You develop a rash.  You have a fever.  You have redness or swelling around the IV site. Get help right away if:  You have trouble breathing.  You have new-onset confusion at  home. Summary  After the procedure, it is common to feel sleepy, have impaired judgment, or feel nauseous if you eat too soon.  Rest after you get home. Know the things you should not do after the procedure.  Follow the diet recommended by your health care provider and drink enough fluid to keep your urine pale yellow.  Get help right away if you have trouble breathing or new-onset confusion at home. This information is not intended to replace advice given to you by your health care provider. Make sure you discuss any questions you have with your health care provider. Document Revised: 02/06/2020 Document Reviewed: 09/04/2019 Elsevier Patient Education  2021 [employer] Femoral Site Care  This sheet gives you information about how to care for yourself after your procedure. Your health care provider may also give you more specific instructions. If you have problems or questions, contact your health care provider. What can I expect after the procedure? After the procedure, it is common to have:  Bruising that usually fades within 1-2 weeks.  Tenderness at the site. Follow these instructions at home: Wound care  Follow instructions from your health care provider about how to take care of your insertion site. Make sure you: ? Wash your hands with soap and water before you change your bandage (dressing). If soap and water are not available, use hand sanitizer. ? Change your dressing as told by your health care provider. ? Leave stitches (sutures), skin glue, or   adhesive strips in place. These skin closures may need to stay in place for 2 weeks or longer. If adhesive strip edges start to loosen and curl up, you may trim the loose edges. Do not remove adhesive strips completely unless your health care provider tells you to do that.  Do not take baths, swim, or use a hot tub until your health care provider approves.  You may shower 24-48 hours after the procedure or as told by your health  care provider. ? Gently wash the site with plain soap and water. ? Pat the area dry with a clean towel. ? Do not rub the site. This may cause bleeding.  Do not apply powder or lotion to the site. Keep the site clean and dry.  Check your femoral site every day for signs of infection. Check for: ? Redness, swelling, or pain. ? Fluid or blood. ? Warmth. ? Pus or a bad smell. Activity  For the first 2-3 days after your procedure, or as long as directed: ? Avoid climbing stairs as much as possible. ? Do not squat.  Do not lift anything that is heavier than 10 lb (4.5 kg), or the limit that you are told, until your health care provider says that it is safe.  Rest as directed. ? Avoid sitting for a long time without moving. Get up to take short walks every 1-2 hours.  Do not drive for 24 hours if you were given a medicine to help you relax (sedative). General instructions  Take over-the-counter and prescription medicines only as told by your health care provider.  Keep all follow-up visits as told by your health care provider. This is important. Contact a health care provider if you have:  A fever or chills.  You have redness, swelling, or pain around your insertion site. Get help right away if:  The catheter insertion area swells very fast.  You pass out.  You suddenly start to sweat or your skin gets clammy.  The catheter insertion area is bleeding, and the bleeding does not stop when you hold steady pressure on the area.  The area near or just beyond the catheter insertion site becomes pale, cool, tingly, or numb. These symptoms may represent a serious problem that is an emergency. Do not wait to see if the symptoms will go away. Get medical help right away. Call your local emergency services (911 in the [place] Do not drive yourself to the hospital. Summary  After the procedure, it is common to have bruising that usually fades within 1-2 weeks.  Check your femoral site  every day for signs of infection.  Do not lift anything that is heavier than 10 lb (4.5 kg), or the limit that you are told, until your health care provider says that it is safe. This information is not intended to replace advice given to you by your health care provider. Make sure you discuss any questions you have with your health care provider. Document Revised: 06/11/2020 Document Reviewed: 06/11/2020 Elsevier Patient Education  2021 [employer]vier Inc[employer]  

## 2020-12-14 NOTE — H&P (Signed)
[facility] History & Physical Update  The patient was interviewed and re-examined.  The patient's previous History and Physical has been reviewed and is unchanged.  There is no change in the plan of care. We plan to proceed with the scheduled procedure.  [name], MD  12/14/2020, 11:39 AM

## 2020-12-14 NOTE — Interval H&P Note (Signed)
History and Physical Interval Note:  12/14/2020 11:39 AM  Carla [name]  has presented today for surgery, with the diagnosis of RT lower extremity angio  BARD   ASO w claudication.  The various methods of treatment have been discussed with the patient and family. After consideration of risks, benefits and other options for treatment, the patient has consented to  Procedure(s): LOWER EXTREMITY ANGIOGRAPHY (Right) as a surgical intervention.  The patient's history has been reviewed, patient examined, no change in status, stable for surgery.  I have reviewed the patient's chart and labs.  Questions were answered to the patient's satisfaction.     [name]

## 2020-12-15 ENCOUNTER — Telehealth (INDEPENDENT_AMBULATORY_CARE_PROVIDER_SITE_OTHER): Payer: Self-pay

## 2020-12-15 NOTE — Telephone Encounter (Signed)
Please schedule per Eulogio Ditch NP. Thank you

## 2020-12-15 NOTE — Telephone Encounter (Signed)
Patient had a right leg angio on 12/13/20 with Dr. Lucky Cowboy, patient stating she is having the same pain and restriction with walking as before. Patient does not have any other symptoms. Please advise.

## 2020-12-15 NOTE — Telephone Encounter (Signed)
Called and scheduled patient

## 2020-12-15 NOTE — Telephone Encounter (Signed)
If the pain never went away, I suspect it is her back.  Unfortunately her follow up was scheduled without ABIs as directed.  She can come in with aBIs and an Aorta Illiac duplex

## 2020-12-16 ENCOUNTER — Ambulatory Visit: Payer: Medicare Other | Admitting: Podiatry

## 2020-12-22 ENCOUNTER — Ambulatory Visit (INDEPENDENT_AMBULATORY_CARE_PROVIDER_SITE_OTHER): Payer: Medicare Other | Admitting: Surgery

## 2020-12-22 ENCOUNTER — Encounter: Payer: Self-pay | Admitting: Surgery

## 2020-12-22 ENCOUNTER — Other Ambulatory Visit: Payer: Self-pay

## 2020-12-22 VITALS — BP 147/61 | HR 82 | Temp 98.3°F | Ht 62.0 in | Wt 230.4 lb

## 2020-12-22 DIAGNOSIS — R52 Pain, unspecified: Secondary | ICD-10-CM | POA: Diagnosis not present

## 2020-12-22 DIAGNOSIS — I70221 Atherosclerosis of native arteries of extremities with rest pain, right leg: Secondary | ICD-10-CM | POA: Diagnosis not present

## 2020-12-22 NOTE — Patient Instructions (Addendum)
Dr. Dahlia Byes recommends that you comply with what the pain Dr. recommends in order to get pain management. We will see you back to discuss surgery. We have placed another referral to the pain clinic. They will call you for an appointment. If you have any concerns or questions, please feel free to call our office. See your follow up appointment below.

## 2020-12-24 IMAGING — CR DG LUMBAR SPINE COMPLETE 4+V
1 series · 5 of 5 positions shown · non-contrast
Comparison: None.

CLINICAL DATA: Low back pain with left-sided radicular symptoms

EXAM:
LUMBAR SPINE - COMPLETE 4+ VIEW

[Series 1: dg lumbar spine complete 4 +v · 0.14mm/px · 5 of 5 slices shown]
[im 1/5]
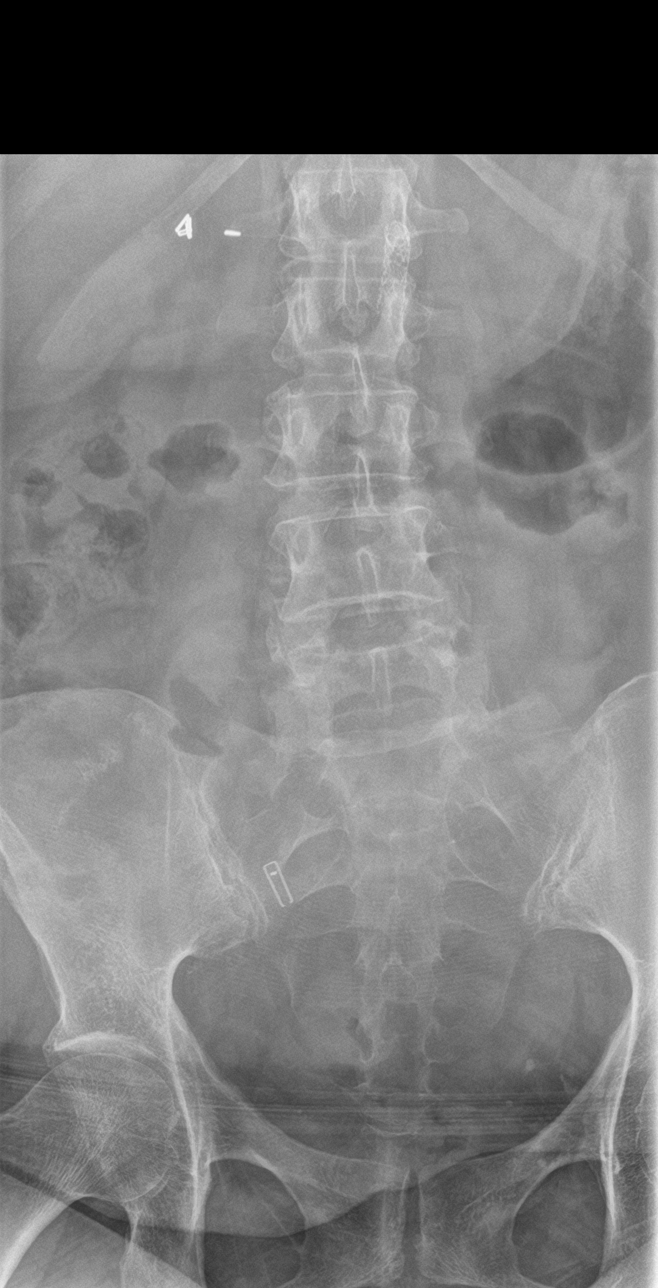
[im 2/5]
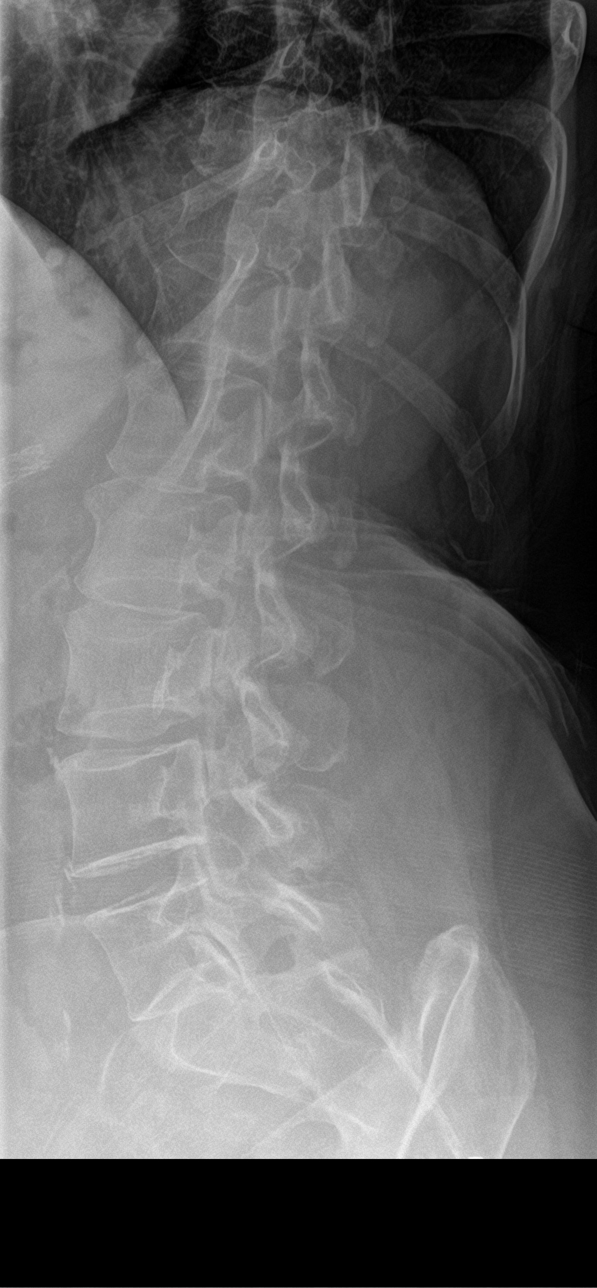
[im 3/5]
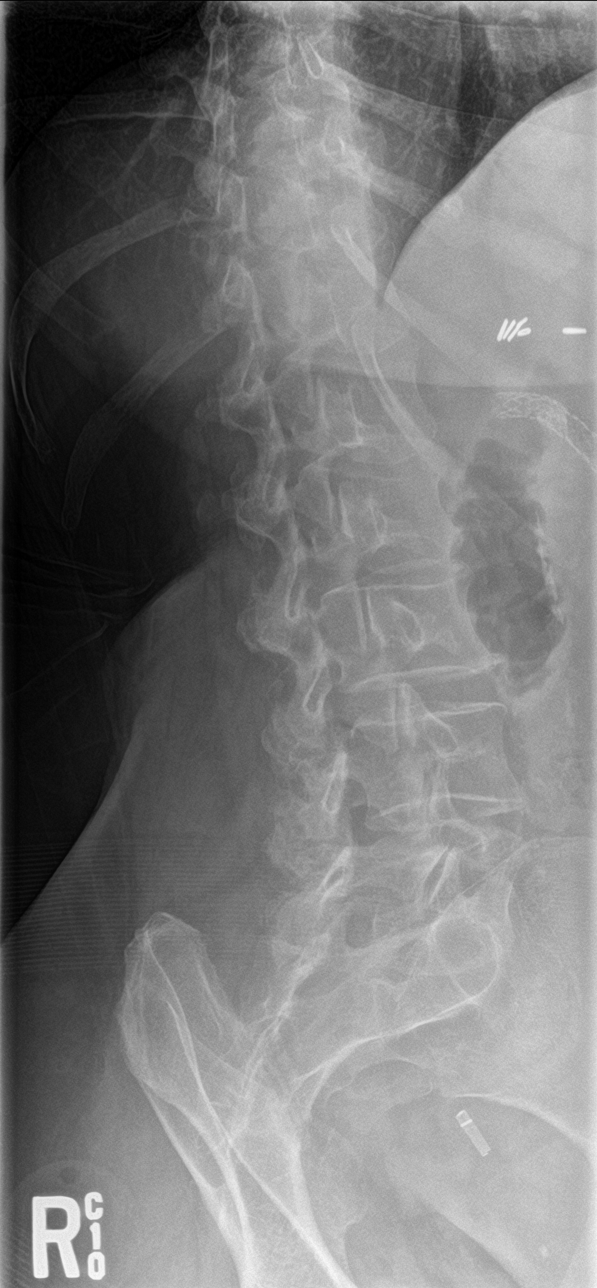
[im 4/5]
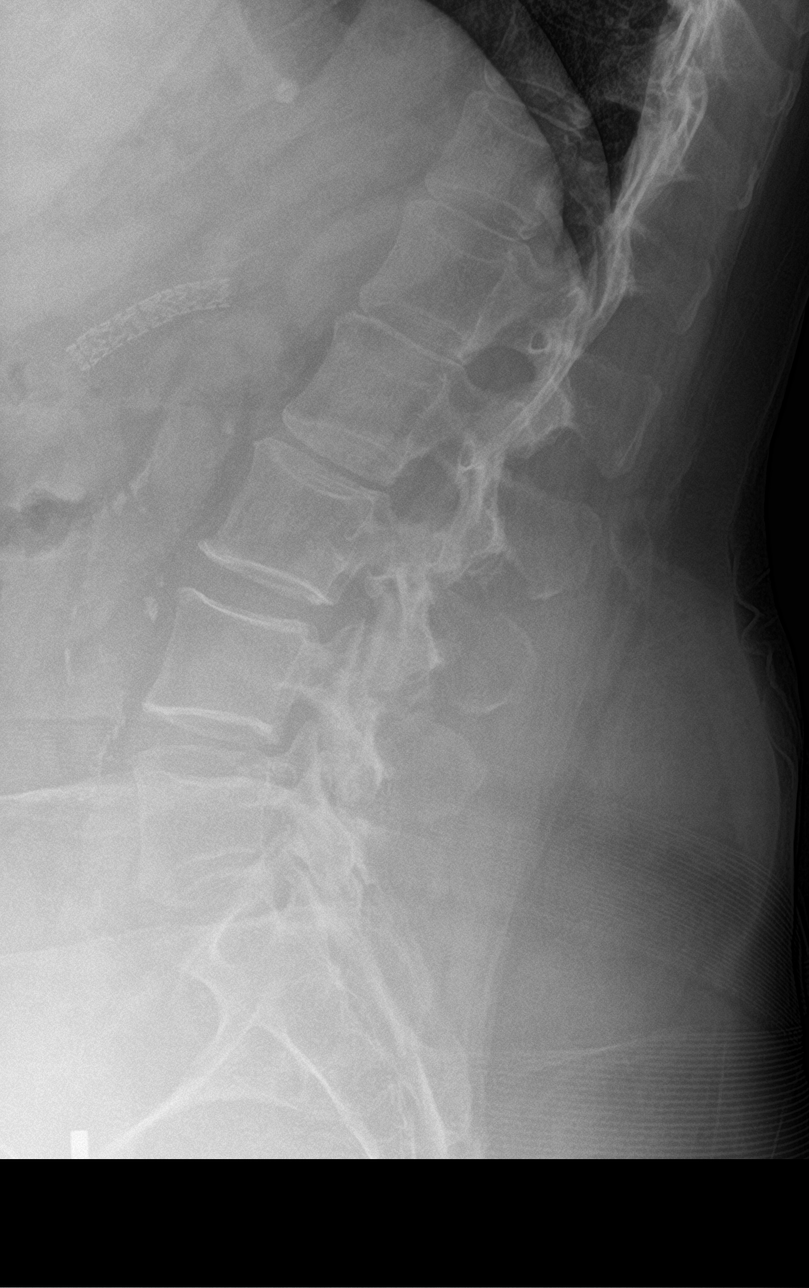
[im 5/5]
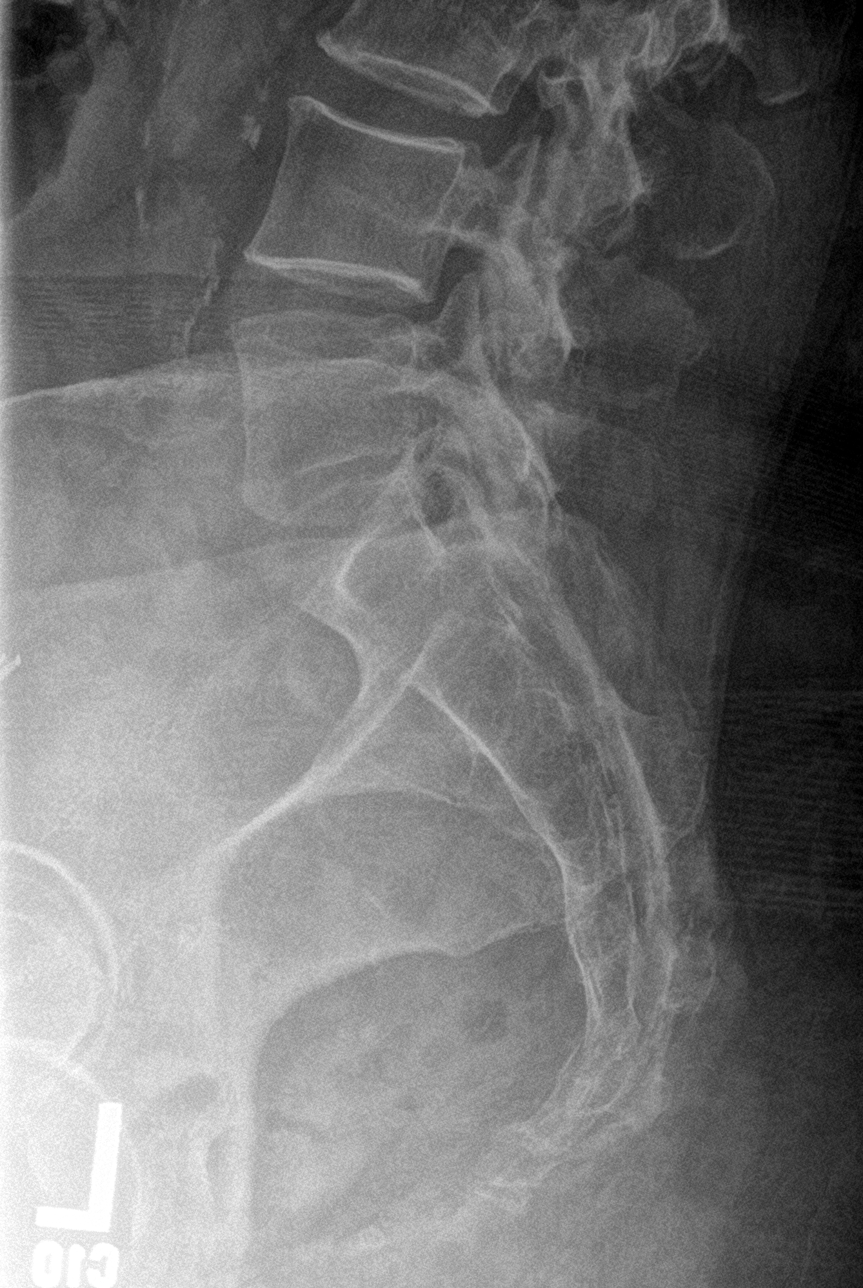

[5 of 5 positions shown; findings below may reference images not displayed]

FINDINGS: Frontal, lateral, spot lumbosacral lateral, and bilateral oblique
views were obtained. There are 5 non-rib-bearing lumbar type
vertebral bodies. There is mild mid lumbar dextroscoliosis. There is
no fracture or spondylolisthesis. There is fairly mild disc space
narrowing at L4-5 and L5-S1. Other disc spaces appear unremarkable.
There is slight facet osteoarthritic change at L5-S1 bilaterally.
There is aortic atherosclerosis.
IMPRESSION: Slight scoliosis. Mild osteoarthritic change at L4-5 and L5-S1. No
fracture or spondylolisthesis.

Aortic Atherosclerosis ([id number]).

## 2020-12-24 NOTE — Progress Notes (Signed)
Outpatient Surgical Follow Up  12/24/2020  Carla [name] is an 60 y.o. female.   Chief Complaint  Patient presents with  . Follow-up    Incisional hernia    HPI:  60 year old female well-known to me with large incisional hernia with loss of domain.  She does have significant comorbidities to include peripheral vascular disease, smoking, COPD, diabetes and a BMI of 42.  She is lost 13 pounds since my last visit and she is going the right direction. He has significant social issues and definitely she is depressed.  She does have chronic pain.  I have sent her to pain clinic unfortunately they deemed her that she was not a candidate for opiates.  She has refused some physical therapy as she is unable to do that.  She now seems to be willing to follow any recommendation by the pain clinic. He is trying to lose weight and she has lost 8 pounds since last visit.  She also had a recent angiography due to some lower extremity pain and claudication.  He did undergo right common and external iliac thrombectomy and angioplasty on the left side.  He continues to endorse abdominal pain and lower extremity pain.   Past Medical History:  Diagnosis Date  . Abdominal aortic atherosclerosis ([place])    a. 05/2017 CTA abd/pelvis: significant atherosclerotic dzs of the infrarenal abd Ao w/ some mural thrombus. No aneurysm or dissection.  . Abdominal pain   . Acute focal ischemia of small intestine (HCC)   . Acute right-sided low back pain with right-sided sciatica 06/13/2017  . AKI (acute kidney injury) ([place]) 07/29/2017  . Baker's cyst of knee, right    a. 07/2016 U/S: 4.1 x 1.4 x 2.9 cystic structure in R poplitetal fossa.  . Bell's palsy   . Borderline diabetes   . Cancer ([place])    skin cancer on nose  . Chest pain    a. 08/2012 Lexiscan MV: EF 54%, non ischemia/infarct.  . CHF (congestive heart failure) ([place])   . Chronic idiopathic constipation   . Chronic mesenteric ischemia ([place])   . COPD (chronic  obstructive pulmonary disease) ([place])   . Diastolic dysfunction    a. 05/2017 Echo: Ef 60-65%, no rwma, Gr1 DD, no source of cardiac emboli.  . 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[place])    a. 05/2017 CTA Abd/pelvis: apparent thrombus or embolus in prox SMA (70-90%); b. 05/2017 catheter directed tPA, mechanical thrombectomy, and stenting of the SMA.  . Essential hypertension with goal blood pressure less than 140/90 01/19/2016  . Gastroesophageal reflux disease 01/19/2016  . GERD (gastroesophageal reflux disease)   . H/O colectomy   . History of kidney stones   . Hyperlipidemia   . Hypertension   . Hypotension 07/29/2017  . Morbid obesity with BMI of 40.0-44.9, adult ([place])   . Occlusive mesenteric ischemia ([place]) 06/19/2017  . Personal history of other malignant neoplasm of skin 06/01/2016  . Pre-diabetes   . Primary osteoarthritis of both knees 06/13/2017  . Recurrent major depressive disorder ([place]) 01/19/2016  . SBO (small bowel obstruction) ([place]) 08/06/2017  . 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 Urinary tract infection 01/09/2014    Past Surgical History:  Procedure Laterality Date  . AMPUTATION TOE Left 12/09/2019   Procedure: AMPUTATION TOE MPJ left;  Surgeon: Caroline More, DPM;  Location: [facility];  Service: Podiatry;  Laterality: Left;  . APPENDECTOMY    . CHOLECYSTECTOMY    . LAPAROTOMY N/A 08/08/2017  Procedure: EXPLORATORY LAPAROTOMY POSSIBLE BOWEL RESECTION;  Surgeon: Jules Husbands, MD;  Location: [facility] ORS;  Service: General;  Laterality: N/A;  . LOWER EXTREMITY ANGIOGRAPHY Left 11/17/2019   Procedure: LOWER EXTREMITY ANGIOGRAPHY;  Surgeon: Algernon Huxley, MD;  Location: [facility] [place] [facility];  Service: Cardiovascular;  Laterality: Left;  . LOWER EXTREMITY ANGIOGRAPHY Right 12/13/2020   Procedure: LOWER EXTREMITY ANGIOGRAPHY;  Surgeon: Algernon Huxley, MD;  Location: [place];  Service: Cardiovascular;  Laterality: Right;  . LOWER EXTREMITY INTERVENTION N/A  11/19/2019   Procedure: LOWER EXTREMITY INTERVENTION;  Surgeon: Algernon Huxley, MD;  Location: [facility];  Service: Cardiovascular;  Laterality: N/A;  . TEE WITHOUT CARDIOVERSION N/A 06/22/2017   Procedure: TRANSESOPHAGEAL ECHOCARDIOGRAM (TEE);  Surgeon: Wellington Hampshire, MD;  Location: [facility];  Service: Cardiovascular;  Laterality: N/A;  . TOTAL KNEE ARTHROPLASTY Right 07/11/2018   Procedure: TOTAL KNEE ARTHROPLASTY;  Surgeon: Corky Mull, MD;  Location: [facility];  Service: Orthopedics;  Laterality: Right;  [name] VAGINAL HYSTERECTOMY    . VISCERAL ARTERY INTERVENTION N/A 06/20/2017   Procedure: Visceral Artery Intervention, possible aortic thrombectomy;  Surgeon: Algernon Huxley, MD;  Location: [place] [facility];  Service: Cardiovascular;  Laterality: N/A;  . VISCERAL ARTERY INTERVENTION N/A 01/28/2018   Procedure: VISCERAL ARTERY INTERVENTION;  Surgeon: Algernon Huxley, MD;  Location: [facility];  Service: Cardiovascular;  Laterality: N/A;    Family History  Problem Relation Age of Onset  . Hypertension Mother   . Heart disease Mother   . Heart attack Mother   . Breast cancer Mother   . Hypertension Father   . Breast cancer Paternal Aunt     Social History:  reports that she has been smoking cigarettes. She has been smoking about 0.10 packs per day. She has never used smokeless tobacco. She reports that she does not drink alcohol and does not use drugs.  Allergies:  Allergies  Allergen Reactions  . Gabapentin     Tremors   . Bactrim [Sulfamethoxazole-Trimethoprim] Itching    Medications reviewed.    ROS Full ROS performed and is otherwise negative other than what is stated in HPI   BP (!) 147/61   Pulse 82   Temp 98.3 F (36.8 C) (Oral)   Ht 5\' 2"  (1.575 m)   Wt 230 lb 6.4 oz (104.5 kg)   SpO2 98%   BMI 42.14 kg/m   Physical Exam Vitals and nursing note reviewed. Exam conducted with a chaperone present.  Constitutional:      Appearance: Normal  appearance. She is obese. She is not ill-appearing.  Cardiovascular:     Rate and Rhythm: Regular rhythm.     Heart sounds: No murmur heard.   Pulmonary:     Effort: Pulmonary effort is normal. No respiratory distress.     Breath sounds: Normal breath sounds. No stridor.  Abdominal:     Palpations: Abdomen is soft.     Tenderness: There is no rebound.     Hernia: A hernia is present.     Comments: Evidence of loss of domain.  Chronically incarcerated hernia.  There are some superficial ulcerations likely from scratching  Musculoskeletal:     Cervical back: Normal range of motion and neck supple. No rigidity.  Skin:    Capillary Refill: Capillary refill takes less than 2 seconds.  Neurological:     General: No focal deficit present.     Mental Status: She is alert and  oriented to person, place, and time.  Psychiatric:        Mood and Affect: Mood normal.        Behavior: Behavior normal.        Thought Content: Thought content normal.        Judgment: Judgment normal.       Assessment/Plan: 60 year old female with multiple comorbidities to include obesity, peripheral vascular disease, chronic pain, venous stasis, COPD, depression.  Comes in with a very large abdominal wall defect.  I have seen her for about about a year or so and have tried to avoid any surgical intervention however [place] continues to exhibit symptoms and this is a chronically incarcerated large hernia.  She states that she continues to improve her weight and actually has lost 8 pounds since the last visit so this is an objective measures that she is actually losing weight.  Once again I reiterated about the need for multi disciplinary management.  Last time she was not able to follow-up recommendations from the pain doctors.  I spent significant amount of time counseling her about the importance of compliance.  She is willing and wants to do everything possible to get better.  Therefore I have requested an appointment  and evaluation by the pain team. I do think that she will eventually require an abdominal wall reconstruction but before we embark in this endeavor we need to make sure that we have a plan from all perspective to include medical comorbidities as well as chronic pain issues.  Greater than 50% of the 45 minutes  visit was spent in counseling/coordination of care   Caroleen Hamman, MD [facility] Surgeon

## 2021-01-04 ENCOUNTER — Ambulatory Visit (INDEPENDENT_AMBULATORY_CARE_PROVIDER_SITE_OTHER): Payer: Medicare Other | Admitting: Vascular Surgery

## 2021-01-04 ENCOUNTER — Other Ambulatory Visit (INDEPENDENT_AMBULATORY_CARE_PROVIDER_SITE_OTHER): Payer: Self-pay | Admitting: Nurse Practitioner

## 2021-01-04 DIAGNOSIS — I70229 Atherosclerosis of native arteries of extremities with rest pain, unspecified extremity: Secondary | ICD-10-CM

## 2021-01-04 DIAGNOSIS — Z9862 Peripheral vascular angioplasty status: Secondary | ICD-10-CM

## 2021-01-05 ENCOUNTER — Other Ambulatory Visit: Payer: Self-pay

## 2021-01-05 ENCOUNTER — Ambulatory Visit (INDEPENDENT_AMBULATORY_CARE_PROVIDER_SITE_OTHER): Payer: Medicare Other | Admitting: Nurse Practitioner

## 2021-01-05 ENCOUNTER — Telehealth (INDEPENDENT_AMBULATORY_CARE_PROVIDER_SITE_OTHER): Payer: Self-pay

## 2021-01-05 ENCOUNTER — Ambulatory Visit (INDEPENDENT_AMBULATORY_CARE_PROVIDER_SITE_OTHER): Payer: Medicare Other

## 2021-01-05 ENCOUNTER — Encounter (INDEPENDENT_AMBULATORY_CARE_PROVIDER_SITE_OTHER): Payer: Self-pay | Admitting: Nurse Practitioner

## 2021-01-05 VITALS — BP 130/87 | HR 93 | Ht 62.0 in | Wt 235.0 lb

## 2021-01-05 DIAGNOSIS — E785 Hyperlipidemia, unspecified: Secondary | ICD-10-CM

## 2021-01-05 DIAGNOSIS — F17200 Nicotine dependence, unspecified, uncomplicated: Secondary | ICD-10-CM

## 2021-01-05 DIAGNOSIS — Z9862 Peripheral vascular angioplasty status: Secondary | ICD-10-CM

## 2021-01-05 DIAGNOSIS — I70229 Atherosclerosis of native arteries of extremities with rest pain, unspecified extremity: Secondary | ICD-10-CM

## 2021-01-05 DIAGNOSIS — I70221 Atherosclerosis of native arteries of extremities with rest pain, right leg: Secondary | ICD-10-CM

## 2021-01-05 MED ORDER — TRAMADOL HCL 50 MG PO TABS: 50.0000 mg | ORAL_TABLET | Freq: Three times a day (TID) | ORAL | 0 refills | Status: DC | PRN

## 2021-01-05 NOTE — Telephone Encounter (Signed)
Patient was seen in office and was scheduled for a RLE angio with Dr. Lucky Cowboy on 01/10/21 with a 9:45 am arrival time to the [facility]. Covid testing on 01/06/21 between 8-2 pm at the [facility]. Pre-procedure instructions were discussed and will be mailed as the patient did leave before paperwork was given.

## 2021-01-06 ENCOUNTER — Encounter (INDEPENDENT_AMBULATORY_CARE_PROVIDER_SITE_OTHER): Payer: Self-pay | Admitting: Nurse Practitioner

## 2021-01-06 ENCOUNTER — Other Ambulatory Visit
Admission: RE | Admit: 2021-01-06 | Discharge: 2021-01-06 | Disposition: A | Discharge status: Home / Self Care | Payer: Medicare Other | Source: Ambulatory Visit | Attending: Vascular Surgery | Admitting: Vascular Surgery

## 2021-01-06 DIAGNOSIS — Z20822 Contact with and (suspected) exposure to covid-19: Secondary | ICD-10-CM | POA: Insufficient documentation

## 2021-01-06 DIAGNOSIS — Z01812 Encounter for preprocedural laboratory examination: Secondary | ICD-10-CM | POA: Diagnosis present

## 2021-01-06 NOTE — Progress Notes (Signed)
Subjective:    Patient ID: Carla [name], female    DOB: 30-Aug-1961, 60 y.o.   MRN: [id number] Chief Complaint  Patient presents with  . Follow-up    3 week [facility] post le angio  add on per phone  note aorta illiac abi    The patient returns to the office for followup and review status post angiogram with intervention. The patient notes no improvement in the lower extremity symptoms.  There is interval shortening of the patient's claudication distance and rest pain symptoms. Previous wounds have now healed.  No new ulcers or wounds have occurred since the last visit.  The patient is extremely tearful today given that she has had family members with multiple health issues in addition to concern about her own health issues.  The patient denies amaurosis fugax or recent TIA symptoms. There are no recent neurological changes noted. The patient denies history of DVT, PE or superficial thrombophlebitis. The patient denies recent episodes of angina or shortness of breath.   ABI's Rt=0.42 and Lt=0.86  (previous ABI's Rt=0.79 and Lt=1.10) Duplex US of the bilateral tibial arteries reveals monophasic waveforms with flat right first digit waveforms and abnormal left great digit waveforms.  Aortoiliac duplex showed low dampened monophasic flow throughout the right iliac system.  The common iliac artery is not visualized and waveforms are consistent with a possible reocclusion or significant stenosis post iliac thrombectomy.  The left iliac system was patent.   Review of Systems  Musculoskeletal: Positive for gait problem and myalgias.  All other systems reviewed and are negative.      Objective:   Physical Exam Vitals reviewed.  HENT:     Head: Normocephalic.  Cardiovascular:     Rate and Rhythm: Normal rate.     Pulses: Normal pulses.  Pulmonary:     Effort: Pulmonary effort is normal.  Skin:    General: Skin is warm and dry.  Neurological:     Mental Status: She is alert and oriented to  person, place, and time.  Psychiatric:        Mood and Affect: Mood normal. Affect is tearful.        Behavior: Behavior normal.        Thought Content: Thought content normal.        Judgment: Judgment normal.     BP 130/87   Pulse 93   Ht 5\' 2"  (1.575 m)   Wt 235 lb (106.6 kg)   BMI 42.98 kg/m   Past Medical History:  Diagnosis Date  . Abdominal aortic atherosclerosis ([place])    a. 05/2017 CTA abd/pelvis: significant atherosclerotic dzs of the infrarenal abd Ao w/ some mural thrombus. No aneurysm or dissection.  . Abdominal pain   . Acute focal ischemia of small intestine (HCC)   . Acute right-sided low back pain with right-sided sciatica 06/13/2017  . AKI (acute kidney injury) ([place]) 07/29/2017  . Baker's cyst of knee, right    a. 07/2016 U/S: 4.1 x 1.4 x 2.9 cystic structure in R poplitetal fossa.  . Bell's palsy   . Borderline diabetes   . Cancer ([place])    skin cancer on nose  . Chest pain    a. 08/2012 Lexiscan MV: EF 54%, non ischemia/infarct.  . CHF (congestive heart failure) ([place])   . Chronic idiopathic constipation   . Chronic mesenteric ischemia ([place])   . COPD (chronic obstructive pulmonary disease) ([place])   . Diastolic dysfunction    a. 05/2017 Echo: Ef  60-65%, no rwma, Gr1 DD, no source of cardiac emboli.  . 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[place])    a. 05/2017 CTA Abd/pelvis: apparent thrombus or embolus in prox SMA (70-90%); b. 05/2017 catheter directed tPA, mechanical thrombectomy, and stenting of the SMA.  . Essential hypertension with goal blood pressure less than 140/90 01/19/2016  . Gastroesophageal reflux disease 01/19/2016  . GERD (gastroesophageal reflux disease)   . H/O colectomy   . History of kidney stones   . Hyperlipidemia   . Hypertension   . Hypotension 07/29/2017  . Morbid obesity with BMI of 40.0-44.9, adult ([place])   . Occlusive mesenteric ischemia ([place]) 06/19/2017  . Personal history of other malignant neoplasm of skin 06/01/2016  . Pre-diabetes    . Primary osteoarthritis of both knees 06/13/2017  . Recurrent major depressive disorder ([place]) 01/19/2016  . SBO (small bowel obstruction) ([place]) 08/06/2017  . 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 Urinary tract infection 01/09/2014    Social History   Socioeconomic History  . Marital status: Single    Spouse name: Not on file  . Number of children: Not on file  . Years of education: Not on file  . Highest education level: Not on file  Occupational History  . Occupation: Unemployed  Tobacco Use  . Smoking status: Current Some Day Smoker    Packs/day: 0.10    Types: Cigarettes  . Smokeless tobacco: Never Used  Vaping Use  . Vaping Use: Never used  Substance and Sexual Activity  . Alcohol use: No  . Drug use: No  . Sexual activity: Not on file  Other Topics Concern  . Not on file  Social History Narrative   Lives in [place] with boyfriend.  Does not routinely exercise.   Social Determinants of Health   Financial Resource Strain: Not on file  Food Insecurity: Not on file  Transportation Needs: Not on file  Physical Activity: Not on file  Stress: Not on file  Social Connections: Not on file  Intimate Partner Violence: Not on file    Past Surgical History:  Procedure Laterality Date  . AMPUTATION TOE Left 12/09/2019   Procedure: AMPUTATION TOE MPJ left;  Surgeon: Caroline More, DPM;  Location: [facility];  Service: Podiatry;  Laterality: Left;  . APPENDECTOMY    . CHOLECYSTECTOMY    . LAPAROTOMY N/A 08/08/2017   Procedure: EXPLORATORY LAPAROTOMY POSSIBLE BOWEL RESECTION;  Surgeon: Jules Husbands, MD;  Location: [facility];  Service: General;  Laterality: N/A;  . LOWER EXTREMITY ANGIOGRAPHY Left 11/17/2019   Procedure: LOWER EXTREMITY ANGIOGRAPHY;  Surgeon: Algernon Huxley, MD;  Location: [place] [facility];  Service: Cardiovascular;  Laterality: Left;  . LOWER EXTREMITY ANGIOGRAPHY Right 12/13/2020   Procedure: LOWER EXTREMITY ANGIOGRAPHY;  Surgeon: Algernon Huxley, MD;   Location: [place] [facility];  Service: Cardiovascular;  Laterality: Right;  . LOWER EXTREMITY INTERVENTION N/A 11/19/2019   Procedure: LOWER EXTREMITY INTERVENTION;  Surgeon: Algernon Huxley, MD;  Location: [place] [facility];  Service: Cardiovascular;  Laterality: N/A;  . TEE WITHOUT CARDIOVERSION N/A 06/22/2017   Procedure: TRANSESOPHAGEAL ECHOCARDIOGRAM (TEE);  Surgeon: Wellington Hampshire, MD;  Location: [facility];  Service: Cardiovascular;  Laterality: N/A;  . TOTAL KNEE ARTHROPLASTY Right 07/11/2018   Procedure: TOTAL KNEE ARTHROPLASTY;  Surgeon: Corky Mull, MD;  Location: [facility];  Service: Orthopedics;  Laterality: Right;  [name] VAGINAL HYSTERECTOMY    . VISCERAL ARTERY INTERVENTION N/A 06/20/2017   Procedure: Visceral Artery Intervention,  possible aortic thrombectomy;  Surgeon: Algernon Huxley, MD;  Location: [facility];  Service: Cardiovascular;  Laterality: N/A;  . VISCERAL ARTERY INTERVENTION N/A 01/28/2018   Procedure: VISCERAL ARTERY INTERVENTION;  Surgeon: Algernon Huxley, MD;  Location: [place];  Service: Cardiovascular;  Laterality: N/A;    Family History  Problem Relation Age of Onset  . Hypertension Mother   . Heart disease Mother   . Heart attack Mother   . Breast cancer Mother   . Hypertension Father   . Breast cancer Paternal Aunt     Allergies  Allergen Reactions  . Gabapentin     Tremors   . Bactrim [Sulfamethoxazole-Trimethoprim] Itching    CBC Latest Ref Rng & Units 11/23/2019 11/21/2019 11/20/2019  WBC 4.0 - 10.5 K/uL 6.0 5.4 5.8  Hemoglobin 12.0 - 15.0 g/dL 10.9(L) 10.8(L) 10.8(L)  Hematocrit 36.0 - 46.0 % 34.8(L) 34.3(L) 34.7(L)  Platelets 150 - 400 K/uL 288 210 174      CMP     Component Value Date/Time   NA 139 09/30/2020 1124   NA 140 02/12/2015 1137   K 3.7 09/30/2020 1124   K 2.9 (L) 02/12/2015 1137   CL 103 09/30/2020 1124   CL 107 02/12/2015 1137   CO2 24 09/30/2020 1124   CO2 27 02/12/2015 1137   GLUCOSE 146 (H) 09/30/2020  1124   GLUCOSE 120 (H) 02/12/2015 1137   BUN 12 12/13/2020 0753   BUN 15 02/12/2015 1137   CREATININE 0.88 12/13/2020 0753   CREATININE 0.78 02/12/2015 1137   CALCIUM 9.2 09/30/2020 1124   CALCIUM 9.0 02/12/2015 1137   PROT 7.0 09/30/2020 1124   PROT 7.0 09/14/2020 1139   PROT 7.1 01/19/2015 1112   ALBUMIN 3.3 (L) 09/30/2020 1124   ALBUMIN 3.9 09/14/2020 1139   ALBUMIN 3.8 01/19/2015 1112   AST 15 09/30/2020 1124   AST 17 01/19/2015 1112   ALT 13 09/30/2020 1124   ALT 11 (L) 01/19/2015 1112   ALKPHOS 89 09/30/2020 1124   ALKPHOS 84 01/19/2015 1112   BILITOT 0.5 09/30/2020 1124   BILITOT <0.2 09/14/2020 1139   BILITOT 0.5 01/19/2015 1112   GFRNONAA >60 12/13/2020 0753   GFRNONAA >60 02/12/2015 1137   GFRAA >60 11/23/2019 1330   GFRAA >60 02/12/2015 1137     VAS [place] ABI WITH/WO TBI  Result Date: 12/17/2020 LOWER EXTREMITY DOPPLER STUDY Indications: Rest pain, peripheral artery disease, and Painful cold Lt Foot.  Vascular Interventions: 11/17/2019: Aortogram and Selective Angiogram including                         selctive imaging of the Left ATA and PTA. PTA of the                         Left Anterior Tibial Artery. PTA of the Left Posterior                         tibial Artery. Mechanical Thrombectomy to both the Left                         ATA and PTA.                          11/19/2019: Aortogram and Bilateral Iliofemoral  Angiograms. Mechanical thrombectomy to the Left CIA and                         EIA. Kissing balloon expandable stent placement to                         bilateral CIA's. Comparison Study: 08/20/2020 Performing Technologist: Concha Norway RVT  Examination Guidelines: A complete evaluation includes at minimum, Doppler waveform signals and systolic blood pressure reading at the level of bilateral brachial, anterior tibial, and posterior tibial arteries, when vessel segments are accessible. Bilateral testing is considered an integral part of  a complete examination. Photoelectric Plethysmograph (PPG) waveforms and toe systolic pressure readings are included as required and additional duplex testing as needed. Limited examinations for reoccurring indications may be performed as noted.  ABI Findings: +---------+------------------+-----+----------+--------+ Right    Rt Pressure (mmHg)IndexWaveform  Comment  +---------+------------------+-----+----------+--------+ Brachial 142                                       +---------+------------------+-----+----------+--------+ ATA      110               0.77 monophasic         +---------+------------------+-----+----------+--------+ PTA      112               0.79 biphasic           +---------+------------------+-----+----------+--------+ Great Toe54                0.38 Abnormal           +---------+------------------+-----+----------+--------+ +---------+------------------+-----+----------+-------+ Left     Lt Pressure (mmHg)IndexWaveform  Comment +---------+------------------+-----+----------+-------+ ATA      156               1.10 biphasic          +---------+------------------+-----+----------+-------+ PTA      141               0.99 monophasic        +---------+------------------+-----+----------+-------+ Great Toe75                0.53 Normal            +---------+------------------+-----+----------+-------+ +-------+-----------+-----------+------------+------------+ ABI/TBIToday's ABIToday's TBIPrevious ABIPrevious TBI +-------+-----------+-----------+------------+------------+ Right  .79        .38                                 +-------+-----------+-----------+------------+------------+ Left   1.10       .53                                 +-------+-----------+-----------+------------+------------+ Right ABIs and TBIs appear decreased compared to prior study on 07/2020.  Summary: Right: Resting right ankle-brachial index indicates  moderate right lower extremity arterial disease. The right toe-brachial index is abnormal. Left: Resting left ankle-brachial index is within normal range. No evidence of significant left lower extremity arterial disease. The left toe-brachial index is abnormal.  *See table(s) above for measurements and observations.  Electronically signed by [name] MD on 12/17/2020 at 11:59:36 AM.    Final        Assessment & Plan:   1. 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  This represents a limb threatening ischemia and places the patient at the risk for limb loss.  Patient should undergo angiography of the right lower extremity with the hope for intervention for limb salvage.  The risks and benefits as well as the alternative therapies was discussed in detail with the patient.  All questions were answered.  Patient agrees to proceed with angiography.  The patient will follow up with me in the office after the procedure.       2. Hyperlipidemia, unspecified hyperlipidemia type Continue statin as ordered and reviewed, no changes at this time   3. Tobacco use disorder Smoking cessation was discussed, 3-10 minutes spent on this topic specifically    Current Outpatient Medications on File Prior to Visit  Medication Sig Dispense Refill  . albuterol (VENTOLIN HFA) 108 (90 Base) MCG/ACT inhaler Inhale 2 puffs into the lungs every 6 (six) hours as needed for wheezing or shortness of breath.     [name] amLODipine (NORVASC) 5 MG tablet Take 1 tablet (5 mg total) by mouth daily. 30 tablet 2  . apixaban (ELIQUIS) 5 MG TABS tablet Take 5 mg by mouth 2 (two) times daily.    [name] aspirin EC 81 MG tablet Take 81 mg by mouth daily. Swallow whole.    [name] atorvastatin (LIPITOR) 80  MG tablet Take 80 mg by mouth at bedtime.    . citalopram (CELEXA) 20 MG tablet Take 40 mg by mouth daily.    . ferrous sulfate 325 (65 FE) MG tablet Take 325 mg by mouth daily.    . furosemide (LASIX) 40 MG tablet Take by mouth.    . Ipratropium-Albuterol (COMBIVENT) 20-100 MCG/ACT AERS respimat Inhale into the lungs.    . metoprolol tartrate (LOPRESSOR) 50 MG tablet Take 1 tablet (50 mg total) by mouth 2 (two) times daily. 30 tablet 0  . Multiple Vitamin (MULTIVITAMIN WITH MINERALS) TABS tablet Take 1 tablet by mouth daily. 90 tablet 3  . omeprazole (PRILOSEC) 20 MG capsule Take 20 mg by mouth 2 (two) times daily before a meal.    . oxybutynin (DITROPAN-XL) 10 MG 24 hr tablet Take 1 tablet by mouth daily.    . pregabalin (LYRICA) 100 MG capsule Take 100 mg by mouth 3 (three) times daily.    . pregabalin (LYRICA) 200 MG capsule Take 1 capsule (200 mg total) by mouth 2 (two) times daily. 90 capsule 0  . topiramate (TOPAMAX) 200 MG tablet Take 1 tablet by mouth 2 (two) times daily.    [name] torsemide (DEMADEX) 20 MG tablet Take 20 mg by mouth daily.    [name] buPROPion (WELLBUTRIN XL) 150 MG 24 hr tablet Take 150 mg by mouth daily.      No current facility-administered medications on file prior to visit.    There are no Patient Instructions on file for this visit. No follow-ups on file.   Kris Hartmann, NP

## 2021-01-06 NOTE — H&P (View-Only) (Signed)
Subjective:    Patient ID: Carla [name], female    DOB: [birth year], 60 y.o.   MRN: [id number] Chief Complaint  Patient presents with  . Follow-up    3 week [facility] post le angio  add on per phone  note aorta illiac abi    The patient returns to the office for followup and review status post angiogram with intervention. The patient notes no improvement in the lower extremity symptoms.  There is interval shortening of the patient's claudication distance and rest pain symptoms. Previous wounds have now healed.  No new ulcers or wounds have occurred since the last visit.  The patient is extremely tearful today given that she has had family members with multiple health issues in addition to concern about her own health issues.  The patient denies amaurosis fugax or recent TIA symptoms. There are no recent neurological changes noted. The patient denies history of DVT, PE or superficial thrombophlebitis. The patient denies recent episodes of angina or shortness of breath.   ABI's Rt=0.42 and Lt=0.86  (previous ABI's Rt=0.79 and Lt=1.10) Duplex US of the bilateral tibial arteries reveals monophasic waveforms with flat right first digit waveforms and abnormal left great digit waveforms.  Aortoiliac duplex showed low dampened monophasic flow throughout the right iliac system.  The common iliac artery is not visualized and waveforms are consistent with a possible reocclusion or significant stenosis post iliac thrombectomy.  The left iliac system was patent.   Review of Systems  Musculoskeletal: Positive for gait problem and myalgias.  All other systems reviewed and are negative.      Objective:   Physical Exam Vitals reviewed.  HENT:     Head: Normocephalic.  Cardiovascular:     Rate and Rhythm: Normal rate.     Pulses: Normal pulses.  Pulmonary:     Effort: Pulmonary effort is normal.  Skin:    General: Skin is warm and dry.  Neurological:     Mental Status: She is alert and oriented to  person, place, and time.  Psychiatric:        Mood and Affect: Mood normal. Affect is tearful.        Behavior: Behavior normal.        Thought Content: Thought content normal.        Judgment: Judgment normal.     BP 130/87   Pulse 93   Ht 5\' 2"  (1.575 m)   Wt 235 lb (106.6 kg)   BMI 42.98 kg/m   Past Medical History:  Diagnosis Date  . Abdominal aortic atherosclerosis ([place])    a. 05/2017 CTA abd/pelvis: significant atherosclerotic dzs of the infrarenal abd Ao w/ some mural thrombus. No aneurysm or dissection.  . Abdominal pain   . Acute focal ischemia of small intestine (HCC)   . Acute right-sided low back pain with right-sided sciatica 06/13/2017  . AKI (acute kidney injury) ([place]) 07/29/2017  . Baker's cyst of knee, right    a. 07/2016 U/S: 4.1 x 1.4 x 2.9 cystic structure in R poplitetal fossa.  . Bell's palsy   . Borderline diabetes   . Cancer ([place])    skin cancer on nose  . Chest pain    a. 08/2012 Lexiscan MV: EF 54%, non ischemia/infarct.  . CHF (congestive heart failure) ([place])   . Chronic idiopathic constipation   . Chronic mesenteric ischemia ([place])   . COPD (chronic obstructive pulmonary disease) ([place])   . Diastolic dysfunction    a. 05/2017 Echo: Ef  60-65%, no rwma, Gr1 DD, no source of cardiac emboli.  . 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 05/2017 CTA Abd/pelvis: apparent thrombus or embolus in prox SMA (70-90%); b. 05/2017 catheter directed tPA, mechanical thrombectomy, and stenting of the SMA.  . Essential hypertension with goal blood pressure less than 140/90 01/19/2016  . Gastroesophageal reflux disease 01/19/2016  . GERD (gastroesophageal reflux disease)   . H/O colectomy   . History of kidney stones   . Hyperlipidemia   . Hypertension   . Hypotension 07/29/2017  . Morbid obesity with BMI of 40.0-44.9, adult ([place])   . Occlusive mesenteric ischemia ([place]) 06/19/2017  . Personal history of other malignant neoplasm of skin 06/01/2016  . Pre-diabetes    . Primary osteoarthritis of both knees 06/13/2017  . Recurrent major depressive disorder ([place]) 01/19/2016  . SBO (small bowel obstruction) ([place]) 08/06/2017  . 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 Urinary tract infection 01/09/2014    Social History   Socioeconomic History  . Marital status: Single    Spouse name: Not on file  . Number of children: Not on file  . Years of education: Not on file  . Highest education level: Not on file  Occupational History  . Occupation: Unemployed  Tobacco Use  . Smoking status: Current Some Day Smoker    Packs/day: 0.10    Types: Cigarettes  . Smokeless tobacco: Never Used  Vaping Use  . Vaping Use: Never used  Substance and Sexual Activity  . Alcohol use: No  . Drug use: No  . Sexual activity: Not on file  Other Topics Concern  . Not on file  Social History Narrative   Lives in [place] with boyfriend.  Does not routinely exercise.   Social Determinants of Health   Financial Resource Strain: Not on file  Food Insecurity: Not on file  Transportation Needs: Not on file  Physical Activity: Not on file  Stress: Not on file  Social Connections: Not on file  Intimate Partner Violence: Not on file    Past Surgical History:  Procedure Laterality Date  . AMPUTATION TOE Left 12/09/2019   Procedure: AMPUTATION TOE MPJ left;  Surgeon: Caroline More, DPM;  Location: [facility];  Service: Podiatry;  Laterality: Left;  . APPENDECTOMY    . CHOLECYSTECTOMY    . LAPAROTOMY N/A 08/08/2017   Procedure: EXPLORATORY LAPAROTOMY POSSIBLE BOWEL RESECTION;  Surgeon: Jules Husbands, MD;  Location: [facility];  Service: General;  Laterality: N/A;  . LOWER EXTREMITY ANGIOGRAPHY Left 11/17/2019   Procedure: LOWER EXTREMITY ANGIOGRAPHY;  Surgeon: Algernon Huxley, MD;  Location: [place] [facility];  Service: Cardiovascular;  Laterality: Left;  . LOWER EXTREMITY ANGIOGRAPHY Right 12/13/2020   Procedure: LOWER EXTREMITY ANGIOGRAPHY;  Surgeon: Algernon Huxley, MD;   Location: [place] [facility];  Service: Cardiovascular;  Laterality: Right;  . LOWER EXTREMITY INTERVENTION N/A 11/19/2019   Procedure: LOWER EXTREMITY INTERVENTION;  Surgeon: Algernon Huxley, MD;  Location: [place] [facility];  Service: Cardiovascular;  Laterality: N/A;  . TEE WITHOUT CARDIOVERSION N/A 06/22/2017   Procedure: TRANSESOPHAGEAL ECHOCARDIOGRAM (TEE);  Surgeon: Wellington Hampshire, MD;  Location: [facility];  Service: Cardiovascular;  Laterality: N/A;  . TOTAL KNEE ARTHROPLASTY Right 07/11/2018   Procedure: TOTAL KNEE ARTHROPLASTY;  Surgeon: Corky Mull, MD;  Location: [facility];  Service: Orthopedics;  Laterality: Right;  [name] VAGINAL HYSTERECTOMY    . VISCERAL ARTERY INTERVENTION N/A 06/20/2017   Procedure: Visceral Artery Intervention,  possible aortic thrombectomy;  Surgeon: Algernon Huxley, MD;  Location: [place] [facility];  Service: Cardiovascular;  Laterality: N/A;  . VISCERAL ARTERY INTERVENTION N/A 01/28/2018   Procedure: VISCERAL ARTERY INTERVENTION;  Surgeon: Algernon Huxley, MD;  Location: [place] [facility];  Service: Cardiovascular;  Laterality: N/A;    Family History  Problem Relation Age of Onset  . Hypertension Mother   . Heart disease Mother   . Heart attack Mother   . Breast cancer Mother   . Hypertension Father   . Breast cancer Paternal Aunt     Allergies  Allergen Reactions  . Gabapentin     Tremors   . Bactrim [Sulfamethoxazole-Trimethoprim] Itching    CBC Latest Ref Rng & Units 11/23/2019 11/21/2019 11/20/2019  WBC 4.0 - 10.5 K/uL 6.0 5.4 5.8  Hemoglobin 12.0 - 15.0 g/dL 10.9(L) 10.8(L) 10.8(L)  Hematocrit 36.0 - 46.0 % 34.8(L) 34.3(L) 34.7(L)  Platelets 150 - 400 K/uL 288 210 174      CMP     Component Value Date/Time   NA 139 09/30/2020 1124   NA 140 02/12/2015 1137   K 3.7 09/30/2020 1124   K 2.9 (L) 02/12/2015 1137   CL 103 09/30/2020 1124   CL 107 02/12/2015 1137   CO2 24 09/30/2020 1124   CO2 27 02/12/2015 1137   GLUCOSE 146 (H) 09/30/2020  1124   GLUCOSE 120 (H) 02/12/2015 1137   BUN 12 12/13/2020 0753   BUN 15 02/12/2015 1137   CREATININE 0.88 12/13/2020 0753   CREATININE 0.78 02/12/2015 1137   CALCIUM 9.2 09/30/2020 1124   CALCIUM 9.0 02/12/2015 1137   PROT 7.0 09/30/2020 1124   PROT 7.0 09/14/2020 1139   PROT 7.1 01/19/2015 1112   ALBUMIN 3.3 (L) 09/30/2020 1124   ALBUMIN 3.9 09/14/2020 1139   ALBUMIN 3.8 01/19/2015 1112   AST 15 09/30/2020 1124   AST 17 01/19/2015 1112   ALT 13 09/30/2020 1124   ALT 11 (L) 01/19/2015 1112   ALKPHOS 89 09/30/2020 1124   ALKPHOS 84 01/19/2015 1112   BILITOT 0.5 09/30/2020 1124   BILITOT <0.2 09/14/2020 1139   BILITOT 0.5 01/19/2015 1112   GFRNONAA >60 12/13/2020 0753   GFRNONAA >60 02/12/2015 1137   GFRAA >60 11/23/2019 1330   GFRAA >60 02/12/2015 1137     VAS [place] ABI WITH/WO TBI  Result Date: 12/17/2020 LOWER EXTREMITY DOPPLER STUDY Indications: Rest pain, peripheral artery disease, and Painful cold Lt Foot.  Vascular Interventions: 11/17/2019: Aortogram and Selective Angiogram including                         selctive imaging of the Left ATA and PTA. PTA of the                         Left Anterior Tibial Artery. PTA of the Left Posterior                         tibial Artery. Mechanical Thrombectomy to both the Left                         ATA and PTA.                          11/19/2019: Aortogram and Bilateral Iliofemoral  Angiograms. Mechanical thrombectomy to the Left CIA and                         EIA. Kissing balloon expandable stent placement to                         bilateral CIA's. Comparison Study: 08/20/2020 Performing Technologist: Concha Norway RVT  Examination Guidelines: A complete evaluation includes at minimum, Doppler waveform signals and systolic blood pressure reading at the level of bilateral brachial, anterior tibial, and posterior tibial arteries, when vessel segments are accessible. Bilateral testing is considered an integral part of  a complete examination. Photoelectric Plethysmograph (PPG) waveforms and toe systolic pressure readings are included as required and additional duplex testing as needed. Limited examinations for reoccurring indications may be performed as noted.  ABI Findings: +---------+------------------+-----+----------+--------+ Right    Rt Pressure (mmHg)IndexWaveform  Comment  +---------+------------------+-----+----------+--------+ Brachial 142                                       +---------+------------------+-----+----------+--------+ ATA      110               0.77 monophasic         +---------+------------------+-----+----------+--------+ PTA      112               0.79 biphasic           +---------+------------------+-----+----------+--------+ Great Toe54                0.38 Abnormal           +---------+------------------+-----+----------+--------+ +---------+------------------+-----+----------+-------+ Left     Lt Pressure (mmHg)IndexWaveform  Comment +---------+------------------+-----+----------+-------+ ATA      156               1.10 biphasic          +---------+------------------+-----+----------+-------+ PTA      141               0.99 monophasic        +---------+------------------+-----+----------+-------+ Great Toe75                0.53 Normal            +---------+------------------+-----+----------+-------+ +-------+-----------+-----------+------------+------------+ ABI/TBIToday's ABIToday's TBIPrevious ABIPrevious TBI +-------+-----------+-----------+------------+------------+ Right  .79        .38                                 +-------+-----------+-----------+------------+------------+ Left   1.10       .53                                 +-------+-----------+-----------+------------+------------+ Right ABIs and TBIs appear decreased compared to prior study on 07/2020.  Summary: Right: Resting right ankle-brachial index indicates  moderate right lower extremity arterial disease. The right toe-brachial index is abnormal. Left: Resting left ankle-brachial index is within normal range. No evidence of significant left lower extremity arterial disease. The left toe-brachial index is abnormal.  *See table(s) above for measurements and observations.  Electronically signed by [name] MD on 12/17/2020 at 11:59:36 AM.    Final        Assessment & Plan:   1. 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  This represents a limb threatening ischemia and places the patient at the risk for limb loss.  Patient should undergo angiography of the right lower extremity with the hope for intervention for limb salvage.  The risks and benefits as well as the alternative therapies was discussed in detail with the patient.  All questions were answered.  Patient agrees to proceed with angiography.  The patient will follow up with me in the office after the procedure.       2. Hyperlipidemia, unspecified hyperlipidemia type Continue statin as ordered and reviewed, no changes at this time   3. Tobacco use disorder Smoking cessation was discussed, 3-10 minutes spent on this topic specifically    Current Outpatient Medications on File Prior to Visit  Medication Sig Dispense Refill  . albuterol (VENTOLIN HFA) 108 (90 Base) MCG/ACT inhaler Inhale 2 puffs into the lungs every 6 (six) hours as needed for wheezing or shortness of breath.     [name] amLODipine (NORVASC) 5 MG tablet Take 1 tablet (5 mg total) by mouth daily. 30 tablet 2  . apixaban (ELIQUIS) 5 MG TABS tablet Take 5 mg by mouth 2 (two) times daily.    [name] aspirin EC 81 MG tablet Take 81 mg by mouth daily. Swallow whole.    [name] atorvastatin (LIPITOR) 80  MG tablet Take 80 mg by mouth at bedtime.    . citalopram (CELEXA) 20 MG tablet Take 40 mg by mouth daily.    . ferrous sulfate 325 (65 FE) MG tablet Take 325 mg by mouth daily.    . furosemide (LASIX) 40 MG tablet Take by mouth.    . Ipratropium-Albuterol (COMBIVENT) 20-100 MCG/ACT AERS respimat Inhale into the lungs.    . metoprolol tartrate (LOPRESSOR) 50 MG tablet Take 1 tablet (50 mg total) by mouth 2 (two) times daily. 30 tablet 0  . Multiple Vitamin (MULTIVITAMIN WITH MINERALS) TABS tablet Take 1 tablet by mouth daily. 90 tablet 3  . omeprazole (PRILOSEC) 20 MG capsule Take 20 mg by mouth 2 (two) times daily before a meal.    . oxybutynin (DITROPAN-XL) 10 MG 24 hr tablet Take 1 tablet by mouth daily.    . pregabalin (LYRICA) 100 MG capsule Take 100 mg by mouth 3 (three) times daily.    . pregabalin (LYRICA) 200 MG capsule Take 1 capsule (200 mg total) by mouth 2 (two) times daily. 90 capsule 0  . topiramate (TOPAMAX) 200 MG tablet Take 1 tablet by mouth 2 (two) times daily.    [name] torsemide (DEMADEX) 20 MG tablet Take 20 mg by mouth daily.    [name] buPROPion (WELLBUTRIN XL) 150 MG 24 hr tablet Take 150 mg by mouth daily.      No current facility-administered medications on file prior to visit.    There are no Patient Instructions on file for this visit. No follow-ups on file.   Kris Hartmann, NP

## 2021-01-07 LAB — SARS CORONAVIRUS 2 (TAT 6-24 HRS): SARS Coronavirus 2: NEGATIVE

## 2021-01-10 ENCOUNTER — Other Ambulatory Visit: Payer: Self-pay

## 2021-01-10 ENCOUNTER — Ambulatory Visit
Admission: RE | Admit: 2021-01-10 | Discharge: 2021-01-10 | Disposition: A | Discharge status: Home / Self Care | Payer: Medicare Other | Attending: Vascular Surgery | Admitting: Vascular Surgery

## 2021-01-10 ENCOUNTER — Encounter
Admission: RE | Disposition: A | Discharge status: Home / Self Care | Payer: Self-pay | Source: Home / Self Care | Attending: Vascular Surgery

## 2021-01-10 ENCOUNTER — Encounter: Payer: Self-pay | Admitting: Vascular Surgery

## 2021-01-10 DIAGNOSIS — Z7982 Long term (current) use of aspirin: Secondary | ICD-10-CM | POA: Diagnosis not present

## 2021-01-10 DIAGNOSIS — Z96651 Presence of right artificial knee joint: Secondary | ICD-10-CM | POA: Insufficient documentation

## 2021-01-10 DIAGNOSIS — I509 Heart failure, unspecified: Secondary | ICD-10-CM | POA: Diagnosis not present

## 2021-01-10 DIAGNOSIS — Z7901 Long term (current) use of anticoagulants: Secondary | ICD-10-CM | POA: Diagnosis not present

## 2021-01-10 DIAGNOSIS — Z803 Family history of malignant neoplasm of breast: Secondary | ICD-10-CM | POA: Insufficient documentation

## 2021-01-10 DIAGNOSIS — N179 Acute kidney failure, unspecified: Secondary | ICD-10-CM | POA: Insufficient documentation

## 2021-01-10 DIAGNOSIS — I70221 Atherosclerosis of native arteries of extremities with rest pain, right leg: Secondary | ICD-10-CM | POA: Insufficient documentation

## 2021-01-10 DIAGNOSIS — I110 Hypertensive heart disease with heart failure: Secondary | ICD-10-CM | POA: Insufficient documentation

## 2021-01-10 DIAGNOSIS — I70229 Atherosclerosis of native arteries of extremities with rest pain, unspecified extremity: Secondary | ICD-10-CM

## 2021-01-10 DIAGNOSIS — Z881 Allergy status to other antibiotic agents status: Secondary | ICD-10-CM | POA: Diagnosis not present

## 2021-01-10 DIAGNOSIS — Z79899 Other long term (current) drug therapy: Secondary | ICD-10-CM | POA: Insufficient documentation

## 2021-01-10 DIAGNOSIS — Z6841 Body Mass Index (BMI) 40.0 and over, adult: Secondary | ICD-10-CM | POA: Diagnosis not present

## 2021-01-10 DIAGNOSIS — Z90710 Acquired absence of both cervix and uterus: Secondary | ICD-10-CM | POA: Insufficient documentation

## 2021-01-10 DIAGNOSIS — I70202 Unspecified atherosclerosis of native arteries of extremities, left leg: Secondary | ICD-10-CM

## 2021-01-10 DIAGNOSIS — Z888 Allergy status to other drugs, medicaments and biological substances status: Secondary | ICD-10-CM | POA: Diagnosis not present

## 2021-01-10 DIAGNOSIS — E785 Hyperlipidemia, unspecified: Secondary | ICD-10-CM | POA: Diagnosis not present

## 2021-01-10 DIAGNOSIS — Z89422 Acquired absence of other left toe(s): Secondary | ICD-10-CM | POA: Insufficient documentation

## 2021-01-10 DIAGNOSIS — R7303 Prediabetes: Secondary | ICD-10-CM | POA: Insufficient documentation

## 2021-01-10 DIAGNOSIS — Z9049 Acquired absence of other specified parts of digestive tract: Secondary | ICD-10-CM | POA: Insufficient documentation

## 2021-01-10 DIAGNOSIS — F17210 Nicotine dependence, cigarettes, uncomplicated: Secondary | ICD-10-CM | POA: Diagnosis not present

## 2021-01-10 DIAGNOSIS — Z8249 Family history of ischemic heart disease and other diseases of the circulatory system: Secondary | ICD-10-CM | POA: Diagnosis not present

## 2021-01-10 DIAGNOSIS — Z560 Unemployment, unspecified: Secondary | ICD-10-CM | POA: Diagnosis not present

## 2021-01-10 HISTORY — PX: LOWER EXTREMITY ANGIOGRAPHY: CATH118251

## 2021-01-10 LAB — CREATININE, SERUM
Creatinine, Ser: 0.96 mg/dL (ref 0.44–1.00)
GFR, Estimated: >60 mL/min (ref >60)

## 2021-01-10 LAB — BUN: BUN: 14 mg/dL (ref 8–23)

## 2021-01-10 SURGERY — LOWER EXTREMITY ANGIOGRAPHY
Anesthesia: Moderate Sedation | Laterality: Right

## 2021-01-10 MED ORDER — CEFAZOLIN SODIUM-DEXTROSE 2-4 GM/100ML-% IV SOLN
INTRAVENOUS | Status: AC
  Administered 2021-01-10: 2 g via INTRAVENOUS
  Filled 2021-01-10: qty 100

## 2021-01-10 MED ORDER — HEPARIN SODIUM (PORCINE) 1000 UNIT/ML IJ SOLN
INTRAMUSCULAR | Status: DC | PRN
  Administered 2021-01-10: 5000 [IU] via INTRAVENOUS

## 2021-01-10 MED ORDER — ACETAMINOPHEN 325 MG PO TABS: 650.0000 mg | ORAL_TABLET | ORAL | Status: DC | PRN

## 2021-01-10 MED ORDER — HEPARIN SODIUM (PORCINE) 1000 UNIT/ML IJ SOLN
INTRAMUSCULAR | Status: AC
  Filled 2021-01-10: qty 1

## 2021-01-10 MED ORDER — FENTANYL CITRATE (PF) 100 MCG/2ML IJ SOLN
INTRAMUSCULAR | Status: AC
  Filled 2021-01-10: qty 2

## 2021-01-10 MED ORDER — ONDANSETRON HCL 4 MG/2ML IJ SOLN: 4.0000 mg | Freq: Four times a day (QID) | INTRAMUSCULAR | Status: DC | PRN

## 2021-01-10 MED ORDER — MIDAZOLAM HCL 2 MG/2ML IJ SOLN
INTRAMUSCULAR | Status: DC | PRN
  Administered 2021-01-10: 2 mg via INTRAVENOUS
  Administered 2021-01-10: 1 mg via INTRAVENOUS

## 2021-01-10 MED ORDER — HYDRALAZINE HCL 20 MG/ML IJ SOLN: 5.0000 mg | INTRAMUSCULAR | Status: DC | PRN

## 2021-01-10 MED ORDER — FENTANYL CITRATE (PF) 100 MCG/2ML IJ SOLN
INTRAMUSCULAR | Status: DC | PRN
  Administered 2021-01-10: 25 ug
  Administered 2021-01-10: 25 ug via INTRAVENOUS
  Administered 2021-01-10: 50 ug via INTRAVENOUS

## 2021-01-10 MED ORDER — CEFAZOLIN SODIUM-DEXTROSE 2-4 GM/100ML-% IV SOLN: 2.0000 g | Freq: Once | INTRAVENOUS | Status: AC

## 2021-01-10 MED ORDER — METHYLPREDNISOLONE SODIUM SUCC 125 MG IJ SOLR: 125.0000 mg | Freq: Once | INTRAMUSCULAR | Status: DC | PRN

## 2021-01-10 MED ORDER — SODIUM CHLORIDE 0.9% FLUSH: 3.0000 mL | Freq: Two times a day (BID) | INTRAVENOUS | Status: DC

## 2021-01-10 MED ORDER — DIPHENHYDRAMINE HCL 50 MG/ML IJ SOLN: 50.0000 mg | Freq: Once | INTRAMUSCULAR | Status: DC | PRN

## 2021-01-10 MED ORDER — IODIXANOL 320 MG/ML IV SOLN
INTRAVENOUS | Status: DC | PRN
  Administered 2021-01-10: 65 mL via INTRA_ARTERIAL

## 2021-01-10 MED ORDER — MIDAZOLAM HCL 5 MG/5ML IJ SOLN
INTRAMUSCULAR | Status: AC
  Filled 2021-01-10: qty 5

## 2021-01-10 MED ORDER — SODIUM CHLORIDE 0.9 % IV SOLN: 250.0000 mL | INTRAVENOUS | Status: DC | PRN

## 2021-01-10 MED ORDER — SODIUM CHLORIDE 0.9 % IV SOLN: INTRAVENOUS | Status: DC

## 2021-01-10 MED ORDER — MIDAZOLAM HCL 2 MG/ML PO SYRP: 8.0000 mg | ORAL_SOLUTION | Freq: Once | ORAL | Status: DC | PRN

## 2021-01-10 MED ORDER — LABETALOL HCL 5 MG/ML IV SOLN
10.0000 mg | INTRAVENOUS | Status: DC | PRN
Start: 2021-01-10 — End: 2021-01-10

## 2021-01-10 MED ORDER — HYDROMORPHONE HCL 1 MG/ML IJ SOLN
1.0000 mg | Freq: Once | INTRAMUSCULAR | Status: DC | PRN
Start: 2021-01-10 — End: 2021-01-10

## 2021-01-10 MED ORDER — FAMOTIDINE 20 MG PO TABS: 40.0000 mg | ORAL_TABLET | Freq: Once | ORAL | Status: DC | PRN

## 2021-01-10 MED ORDER — SODIUM CHLORIDE 0.9% FLUSH: 3.0000 mL | INTRAVENOUS | Status: DC | PRN

## 2021-01-10 SURGICAL SUPPLY — 23 items
BALLN ULTRVRSE 7X60X75C (BALLOONS) ×2
BALLOON ULTRVRSE 7X60X75C (BALLOONS) ×1 IMPLANT
CANISTER PENUMBRA ENGINE (MISCELLANEOUS) ×2 IMPLANT
CANNULA 5F STIFF (CANNULA) ×2 IMPLANT
CATH ANGIO 5F PIGTAIL 65CM (CATHETERS) ×2 IMPLANT
CATH BEACON 5 .035 40 KMP TP (CATHETERS) ×1 IMPLANT
CATH BEACON 5 .038 40 KMP TP (CATHETERS) ×1
CATH INDIGO 7D KIT (CATHETERS) ×2 IMPLANT
COVER EZ STRL 42X30 (DRAPES) ×2 IMPLANT
COVER PROBE U/S 5X48 (MISCELLANEOUS) ×2 IMPLANT
DEVICE STARCLOSE SE CLOSURE (Vascular Products) ×4 IMPLANT
GLIDEWIRE ADV .035X180CM (WIRE) ×4 IMPLANT
KIT ENCORE 26 ADVANTAGE (KITS) ×4 IMPLANT
PACK ANGIOGRAPHY (CUSTOM PROCEDURE TRAY) ×2 IMPLANT
SHEATH BRITE TIP 5FRX11 (SHEATH) ×2 IMPLANT
SHEATH BRITE TIP 6FRX11 (SHEATH) ×2 IMPLANT
SHEATH BRITE TIP 7FRX11 (SHEATH) ×2 IMPLANT
STENT LIFESTREAM 7X26X80 (Permanent Stent) ×2 IMPLANT
STENT LIFESTREAM 7X58X80 (Permanent Stent) ×2 IMPLANT
STENT LIFESTREAM 8X37X80 (Permanent Stent) ×2 IMPLANT
SYR MEDRAD MARK 7 150ML (SYRINGE) ×2 IMPLANT
TUBING CONTRAST HIGH PRESS 72 (TUBING) ×2 IMPLANT
WIRE GUIDERIGHT .035X150 (WIRE) ×2 IMPLANT

## 2021-01-10 NOTE — Op Note (Signed)
[facility]  Percutaneous Study/Intervention Procedural Note   Date of Surgery: 01/10/2021  Surgeon(s):DEW,JASON    Assistants:none  Pre-operative Diagnosis: PAD with rest pain right leg  Post-operative diagnosis:  Same  Procedure(s) Performed:             1.  Ultrasound guidance for vascular access bilateral femoral arteries             2.  Catheter placement into aorta from bilateral femoral approaches             3.  Aortogram and bilateral iliofemoral angiograms             4.  Percutaneous transluminal angioplasty of left common iliac artery with 7 mm diameter angioplasty balloon             5.   Mechanical thrombectomy to the right external and common iliac arteries with penumbra CAT 7D catheter  6.  Kissing balloon stent placement to bilateral common iliac arteries with 8 mm diameter by 37 mm length lifestream stent on the right and 7 mm diameter by 26 millimeters length lifestream stent on the left  7.  Additional stent placement on the right iliac with 7 mm diameter by 58 mm length lifestream stent             8.  StarClose closure device bilateral femoral artery  EBL: 300 cc  Contrast: 65 cc  Fluoro Time: 5.4 minutes  Moderate Conscious Sedation Time: approximately 62 minutes using 3 mg of Versed and 100 mcg of Fentanyl              Indications:  Patient is a 60 y.o.female with recurrent rest pain in the right leg. The patient has noninvasive study showing reduced perfusion again. The patient is brought in for angiography for further evaluation and potential treatment.  Due to the limb threatening nature of the situation, angiogram was performed for attempted limb salvage. The patient is aware that if the procedure fails, amputation would be expected.  The patient also understands that even with successful revascularization, amputation may still be required due to the severity of the situation.  Risks and benefits are discussed and informed consent is  obtained.   Procedure:  The patient was identified and appropriate procedural time out was performed.  The patient was then placed supine on the table and prepped and draped in the usual sterile fashion. Moderate conscious sedation was administered during a face to face encounter with the patient throughout the procedure with my supervision of the RN administering medicines and monitoring the patient's vital signs, pulse oximetry, telemetry and mental status throughout from the start of the procedure until the patient was taken to the recovery room. Ultrasound was used to evaluate the left common femoral artery.  It was patent .  A digital ultrasound image was acquired.  A Seldinger needle was used to access the left common femoral artery under direct ultrasound guidance and a permanent image was performed.  A 0.035 J wire was advanced without resistance and a 5Fr sheath was placed.  Pigtail catheter was placed into the aorta and an AP aortogram was performed. This demonstrated normal renal arteries and normal aorta down to the distal aorta where there was occlusion of the right iliac stents with reconstitution through hypogastric and femoral collaterals.  The right common femoral artery and the proximal portions of the SFA and profunda femoris artery were widely patent.  The left iliac stents had flow  in the left common femoral artery and the proximal portion of the profunda femoris artery and superficial femoral artery were widely patent. It was felt that it was in the patient's best interest to proceed with intervention after these images to avoid a second procedure and a larger amount of contrast and fluoroscopy based off of the findings from the initial angiogram.  I then accessed the right femoral artery under direct ultrasound guidance without difficulty with a micropuncture needle in an area where it was widely patent.  A permanent image was recorded.  A micropuncture wire and sheath were then placed on  the upsized to a 7 French sheath in the right femoral artery.  The patient was systemically heparinized and a 6 French left femoral sheath was then placed over the Terumo Advantage wire. I then used a Kumpe catheter and the advantage wire to cross the right iliac occlusion.  Intraluminal flow was confirmed in the aorta.  I put a 7 mm diameter by 6 cm length balloon up gently in the left common iliac artery to protect this will opt for mechanical thrombectomy in the right external and common iliac arteries.  Significant thrombus was removed, but there remained no flow through the right iliac stents with a pigtail catheter placed back in the aorta for imaging.  I then elected to stent treat.  An 7 mm diameter by 58 mm length lifestream stents placed in the mid right iliac region from the origin of the external iliac artery up to the proximal common iliac artery.  Following this, there remained no flow in the proximal right common iliac artery remained occluded.  I then used an 8 mm diameter by 37 mm length lifestream stent in the right common iliac artery up to the distal aorta.  This had to be treated on the left as well to protect the left side as we did this in a kissing balloon stent fashion.  A 7 mm diameter by 26 mm length lifestream stent was placed up the left.  These were inflated simultaneously into the distal aorta and in both common iliac arteries up to 12 atm.  Completion imaging following this showed both iliac stents now to be widely patent with brisk flow in both femoral arteries and femoral bifurcations were widely patent.  I elected to terminate the procedure. The sheath was removed and StarClose closure device was deployed in the right femoral artery with excellent hemostatic result.  Similarly, the sheath was removed and StarClose closure device was deployed in the left femoral artery with excellent hemostatic result.  The patient was taken to the recovery room in stable condition having tolerated  the procedure well.  Findings:               Aortogram:  This demonstrated normal renal arteries and normal aorta down to the distal aorta where there was occlusion of the right iliac stents with reconstitution through hypogastric and femoral collaterals.  The right common femoral artery and the proximal portions of the SFA and profunda femoris artery were widely patent.  The left iliac stents had flow in the left common femoral artery and the proximal portion of the profunda femoris artery and superficial femoral artery were widely patent                Disposition: Patient was taken to the recovery room in stable condition having tolerated the procedure well.  Complications: None  [name] 01/10/2021 1:07 PM   This note was created  with Dragon Medical transcription system. Any errors in dictation are purely unintentional. 

## 2021-01-10 NOTE — Interval H&P Note (Signed)
History and Physical Interval Note:  01/10/2021 10:54 AM  Carla [name]  has presented today for surgery, with the diagnosis of RT Lower Extremity Angiography   ASO with rest pain   BARD Rep   cc: S Willey   Pt to have Covid test on 3-17.  The various methods of treatment have been discussed with the patient and family. After consideration of risks, benefits and other options for treatment, the patient has consented to  Procedure(s): LOWER EXTREMITY ANGIOGRAPHY (Right) as a surgical intervention.  The patient's history has been reviewed, patient examined, no change in status, stable for surgery.  I have reviewed the patient's chart and labs.  Questions were answered to the patient's satisfaction.     [name]

## 2021-01-11 ENCOUNTER — Encounter: Payer: Self-pay | Admitting: Vascular Surgery

## 2021-01-13 ENCOUNTER — Ambulatory Visit: Payer: Medicare Other | Admitting: Student in an Organized Health Care Education/Training Program

## 2021-01-20 ENCOUNTER — Telehealth (INDEPENDENT_AMBULATORY_CARE_PROVIDER_SITE_OTHER): Payer: Self-pay | Admitting: Vascular Surgery

## 2021-01-20 NOTE — Telephone Encounter (Signed)
Called stating that her right leg is hurting, right foot is red and pinky toe is purple. Patient had procedure done with JD 3/21 le angio. Patient fell tuesday night and last night she states the pain has gotten worse. Patient would like to come in to be seen. Patient was last seen 01/05/21 with fb. Please advise.

## 2021-01-20 NOTE — Telephone Encounter (Signed)
Called & scheduled patient 

## 2021-01-20 NOTE — Telephone Encounter (Signed)
Lets bring the patient with aorta iliac see fallon/dew if possible can we bring the patient in today

## 2021-01-21 ENCOUNTER — Encounter (INDEPENDENT_AMBULATORY_CARE_PROVIDER_SITE_OTHER): Payer: Self-pay | Admitting: Nurse Practitioner

## 2021-01-21 ENCOUNTER — Ambulatory Visit (INDEPENDENT_AMBULATORY_CARE_PROVIDER_SITE_OTHER): Payer: Medicare Other

## 2021-01-21 ENCOUNTER — Other Ambulatory Visit: Payer: Self-pay

## 2021-01-21 ENCOUNTER — Other Ambulatory Visit (INDEPENDENT_AMBULATORY_CARE_PROVIDER_SITE_OTHER): Payer: Self-pay | Admitting: Vascular Surgery

## 2021-01-21 ENCOUNTER — Other Ambulatory Visit
Admission: RE | Admit: 2021-01-21 | Discharge: 2021-01-21 | Disposition: A | Discharge status: Home / Self Care | Payer: Medicare Other | Source: Ambulatory Visit | Attending: Vascular Surgery | Admitting: Vascular Surgery

## 2021-01-21 ENCOUNTER — Ambulatory Visit (INDEPENDENT_AMBULATORY_CARE_PROVIDER_SITE_OTHER): Payer: Medicare Other | Admitting: Nurse Practitioner

## 2021-01-21 VITALS — BP 120/77 | HR 69 | Resp 16 | Ht 62.0 in | Wt 227.0 lb

## 2021-01-21 DIAGNOSIS — I739 Peripheral vascular disease, unspecified: Secondary | ICD-10-CM | POA: Diagnosis not present

## 2021-01-21 DIAGNOSIS — I10 Essential (primary) hypertension: Secondary | ICD-10-CM | POA: Diagnosis not present

## 2021-01-21 DIAGNOSIS — E785 Hyperlipidemia, unspecified: Secondary | ICD-10-CM

## 2021-01-21 DIAGNOSIS — Z20822 Contact with and (suspected) exposure to covid-19: Secondary | ICD-10-CM | POA: Diagnosis not present

## 2021-01-21 DIAGNOSIS — Z95820 Peripheral vascular angioplasty status with implants and grafts: Secondary | ICD-10-CM

## 2021-01-21 DIAGNOSIS — M79604 Pain in right leg: Secondary | ICD-10-CM

## 2021-01-21 DIAGNOSIS — Z716 Tobacco abuse counseling: Secondary | ICD-10-CM | POA: Diagnosis not present

## 2021-01-21 DIAGNOSIS — Z01812 Encounter for preprocedural laboratory examination: Secondary | ICD-10-CM | POA: Insufficient documentation

## 2021-01-21 DIAGNOSIS — I70229 Atherosclerosis of native arteries of extremities with rest pain, unspecified extremity: Secondary | ICD-10-CM | POA: Diagnosis not present

## 2021-01-21 MED ORDER — TRAMADOL HCL 50 MG PO TABS
50.0000 mg | ORAL_TABLET | Freq: Three times a day (TID) | ORAL | 0 refills | Status: DC | PRN
Start: 2021-01-21 — End: 2021-01-27

## 2021-01-21 NOTE — Progress Notes (Signed)
Subjective:    Patient ID: Carla [name], female    DOB: Feb 13, 1961, 60 y.o.   MRN: [id number] Chief Complaint  Patient presents with  . 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  The patient returns to the office for followup and review of the noninvasive studies. There has been a significant deterioration in the lower extremity symptoms.  The patient notes interval shortening of their claudication distance and development of mild rest pain symptoms. No new ulcers or wounds have occurred since the last visit.  However she does have discoloration of her right lower extremity.  The patient also notes having falls due to severe weakness in her right lower extremity.  There have been no significant changes to the patient's overall health care.  The patient denies amaurosis fugax or recent TIA symptoms. There are no recent neurological changes noted. The patient denies history of DVT, PE or superficial thrombophlebitis. The patient denies recent episodes of angina or shortness of breath.   The patient's left iliac system is widely patent.  There is a new stent in the right common iliac and appears widely patent.  The right external iliac artery in-stent is patent but there shows dampened monophasic flow suggesting a possible high-grade stenosis proximally or a possible kink between the common iliac artery and external iliac artery.  There is very low monophasic flow throughout the right lower extremity.   Review of Systems  Cardiovascular:       Claudication  Skin: Positive for color change.  Neurological: Positive for weakness.  All other systems reviewed and are negative.      Objective:   Physical Exam Vitals reviewed.  HENT:     Head: Normocephalic.  Cardiovascular:     Rate and Rhythm: Normal rate.     Pulses:          Dorsalis pedis pulses are 0 on the right side and 1+ on the left side.        Posterior tibial pulses are 0 on the right side and 1+ on the left side.  Pulmonary:     Effort: Pulmonary effort is normal.  Skin:    General: Skin is warm.     Coloration: Skin is cyanotic (Right toes).  Neurological:     Mental Status: She is alert and oriented to person, place, and time.     Motor: Weakness present.     Gait: Gait abnormal.  Psychiatric:        Attention and Perception: Attention normal.        Mood and Affect: Mood normal. Affect is tearful.        Behavior: Behavior normal.        Thought Content: Thought content normal.        Judgment: Judgment normal.     BP 120/77 (BP Location: Right Arm)   Pulse 69   Resp 16   Ht 5\' 2"  (1.575 m)   Wt 227 lb (103 kg)   BMI 41.52 kg/m   Past Medical History:  Diagnosis Date  . Abdominal aortic atherosclerosis ([place])    a. 05/2017 CTA abd/pelvis: significant atherosclerotic dzs of the infrarenal abd Ao w/ some mural thrombus. No aneurysm or dissection.  . Abdominal pain   . Acute focal ischemia of small intestine (HCC)   . Acute right-sided low back pain with right-sided sciatica 06/13/2017  . AKI (acute kidney injury) ([place])  07/29/2017  . Baker's cyst of knee, right    a. 07/2016 U/S: 4.1 x 1.4 x 2.9 cystic structure in R poplitetal fossa.  . Bell's palsy   . Borderline diabetes   . Cancer ([place])    skin cancer on nose  . Chest pain    a. 08/2012 Lexiscan MV: EF 54%, non ischemia/infarct.  . CHF (congestive heart failure) ([place])   . Chronic idiopathic constipation   . Chronic mesenteric ischemia ([place])   . COPD (chronic obstructive pulmonary disease) ([place])   . Diastolic dysfunction    a. 05/2017 Echo: Ef 60-65%, no rwma, Gr1 DD, no source of cardiac emboli.  . 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 05/2017 CTA Abd/pelvis: apparent thrombus or embolus in prox SMA (70-90%); b. 05/2017 catheter directed tPA, mechanical thrombectomy, and stenting of the SMA.  . Essential hypertension with goal blood  pressure less than 140/90 01/19/2016  . Gastroesophageal reflux disease 01/19/2016  . GERD (gastroesophageal reflux disease)   . H/O colectomy   . History of kidney stones   . Hyperlipidemia   . Hypertension   . Hypotension 07/29/2017  . Morbid obesity with BMI of 40.0-44.9, adult ([place])   . Occlusive mesenteric ischemia ([place]) 06/19/2017  . Personal history of other malignant neoplasm of skin 06/01/2016  . Pre-diabetes   . Primary osteoarthritis of both knees 06/13/2017  . Recurrent major depressive disorder ([place]) 01/19/2016  . SBO (small bowel obstruction) ([place]) 08/06/2017  . 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 Urinary tract infection 01/09/2014    Social History   Socioeconomic History  . Marital status: Significant Other    Spouse name: [name]   . Number of children: Not on file  . Years of education: Not on file  . Highest education level: Not on file  Occupational History  . Occupation: Unemployed  Tobacco Use  . Smoking status: Current Some Day Smoker    Packs/day: 0.10    Types: Cigarettes  . Smokeless tobacco: Never Used  Vaping Use  . Vaping Use: Never used  Substance and Sexual Activity  . Alcohol use: No  . Drug use: No  . Sexual activity: Not on file  Other Topics Concern  . Not on file  Social History Narrative   Lives in [place] with boyfriend.  Does not routinely exercise.   Social Determinants of Health   Financial Resource Strain: Not on file  Food Insecurity: Not on file  Transportation Needs: Not on file  Physical Activity: Not on file  Stress: Not on file  Social Connections: Not on file  Intimate Partner Violence: Not on file    Past Surgical History:  Procedure Laterality Date  . AMPUTATION TOE Left 12/09/2019   Procedure: AMPUTATION TOE MPJ left;  Surgeon: Caroline More, DPM;  Location: [facility];  Service: Podiatry;  Laterality: Left;  . APPENDECTOMY    . CHOLECYSTECTOMY    . LAPAROTOMY N/A 08/08/2017   Procedure: EXPLORATORY  LAPAROTOMY POSSIBLE BOWEL RESECTION;  Surgeon: Jules Husbands, MD;  Location: [facility];  Service: General;  Laterality: N/A;  . LOWER EXTREMITY ANGIOGRAPHY Left 11/17/2019   Procedure: LOWER EXTREMITY ANGIOGRAPHY;  Surgeon: Algernon Huxley, MD;  Location: [place];  Service: Cardiovascular;  Laterality: Left;  . LOWER EXTREMITY ANGIOGRAPHY Right 12/13/2020   Procedure: LOWER EXTREMITY ANGIOGRAPHY;  Surgeon: Algernon Huxley, MD;  Location: [place] [facility];  Service: Cardiovascular;  Laterality: Right;  . LOWER EXTREMITY ANGIOGRAPHY  Right 01/10/2021   Procedure: LOWER EXTREMITY ANGIOGRAPHY;  Surgeon: Algernon Huxley, MD;  Location: [place] [facility];  Service: Cardiovascular;  Laterality: Right;  . LOWER EXTREMITY INTERVENTION N/A 11/19/2019   Procedure: LOWER EXTREMITY INTERVENTION;  Surgeon: Algernon Huxley, MD;  Location: [place] [facility];  Service: Cardiovascular;  Laterality: N/A;  . TEE WITHOUT CARDIOVERSION N/A 06/22/2017   Procedure: TRANSESOPHAGEAL ECHOCARDIOGRAM (TEE);  Surgeon: Wellington Hampshire, MD;  Location: [facility];  Service: Cardiovascular;  Laterality: N/A;  . TOTAL KNEE ARTHROPLASTY Right 07/11/2018   Procedure: TOTAL KNEE ARTHROPLASTY;  Surgeon: Corky Mull, MD;  Location: [facility];  Service: Orthopedics;  Laterality: Right;  [name] VAGINAL HYSTERECTOMY    . VISCERAL ARTERY INTERVENTION N/A 06/20/2017   Procedure: Visceral Artery Intervention, possible aortic thrombectomy;  Surgeon: Algernon Huxley, MD;  Location: [place] [facility];  Service: Cardiovascular;  Laterality: N/A;  . VISCERAL ARTERY INTERVENTION N/A 01/28/2018   Procedure: VISCERAL ARTERY INTERVENTION;  Surgeon: Algernon Huxley, MD;  Location: [facility];  Service: Cardiovascular;  Laterality: N/A;    Family History  Problem Relation Age of Onset  . Hypertension Mother   . Heart disease Mother   . Heart attack Mother   . Breast cancer Mother   . Hypertension Father   . Breast cancer Paternal Aunt      Allergies  Allergen Reactions  . Gabapentin     Tremors   . Bactrim [Sulfamethoxazole-Trimethoprim] Itching    CBC Latest Ref Rng & Units 11/23/2019 11/21/2019 11/20/2019  WBC 4.0 - 10.5 K/uL 6.0 5.4 5.8  Hemoglobin 12.0 - 15.0 g/dL 10.9(L) 10.8(L) 10.8(L)  Hematocrit 36.0 - 46.0 % 34.8(L) 34.3(L) 34.7(L)  Platelets 150 - 400 K/uL 288 210 174      CMP     Component Value Date/Time   NA 139 09/30/2020 1124   NA 140 02/12/2015 1137   K 3.7 09/30/2020 1124   K 2.9 (L) 02/12/2015 1137   CL 103 09/30/2020 1124   CL 107 02/12/2015 1137   CO2 24 09/30/2020 1124   CO2 27 02/12/2015 1137   GLUCOSE 146 (H) 09/30/2020 1124   GLUCOSE 120 (H) 02/12/2015 1137   BUN 14 01/10/2021 1107   BUN 15 02/12/2015 1137   CREATININE 0.96 01/10/2021 1107   CREATININE 0.78 02/12/2015 1137   CALCIUM 9.2 09/30/2020 1124   CALCIUM 9.0 02/12/2015 1137   PROT 7.0 09/30/2020 1124   PROT 7.0 09/14/2020 1139   PROT 7.1 01/19/2015 1112   ALBUMIN 3.3 (L) 09/30/2020 1124   ALBUMIN 3.9 09/14/2020 1139   ALBUMIN 3.8 01/19/2015 1112   AST 15 09/30/2020 1124   AST 17 01/19/2015 1112   ALT 13 09/30/2020 1124   ALT 11 (L) 01/19/2015 1112   ALKPHOS 89 09/30/2020 1124   ALKPHOS 84 01/19/2015 1112   BILITOT 0.5 09/30/2020 1124   BILITOT <0.2 09/14/2020 1139   BILITOT 0.5 01/19/2015 1112   GFRNONAA >60 01/10/2021 1107   GFRNONAA >60 02/12/2015 1137   GFRAA >60 11/23/2019 1330   GFRAA >60 02/12/2015 1137     VAS [place] ABI WITH/WO TBI  Result Date: 01/12/2021 LOWER EXTREMITY DOPPLER STUDY Indications: Rest pain, peripheral artery disease, and Painful cold Lt Foot.  Vascular Interventions: 12/13/20 right fem endart                         11/17/2019: Aortogram and Selective Angiogram including  selctive imaging of the Left ATA and PTA. PTA of the                         Left Anterior Tibial Artery. PTA of the Left Posterior                         tibial Artery. Mechanical Thrombectomy  to both the Left                         ATA and PTA.                          11/19/2019: Aortogram and Bilateral Iliofemoral                         Angiograms. Mechanical thrombectomy to the Left CIA and                         EIA. Kissing balloon expandable stent placement to                         bilateral CIA's. Comparison Study: 12/04/19 Performing Technologist: Concha Norway RVT  Examination Guidelines: A complete evaluation includes at minimum, Doppler waveform signals and systolic blood pressure reading at the level of bilateral brachial, anterior tibial, and posterior tibial arteries, when vessel segments are accessible. Bilateral testing is considered an integral part of a complete examination. Photoelectric Plethysmograph (PPG) waveforms and toe systolic pressure readings are included as required and additional duplex testing as needed. Limited examinations for reoccurring indications may be performed as noted.  ABI Findings: +---------+------------------+-----+--------+--------+ Right    Rt Pressure (mmHg)IndexWaveformComment  +---------+------------------+-----+--------+--------+ Brachial 147                                     +---------+------------------+-----+--------+--------+ ATA      62                0.42                  +---------+------------------+-----+--------+--------+ PTA      44                0.30                  +---------+------------------+-----+--------+--------+ Great Toe29                0.20                  +---------+------------------+-----+--------+--------+ +---------+------------------+-----+--------+-------+ Left     Lt Pressure (mmHg)IndexWaveformComment +---------+------------------+-----+--------+-------+ ATA      108               0.73                 +---------+------------------+-----+--------+-------+ PTA      127               0.86                 +---------+------------------+-----+--------+-------+ Great  Toe61                0.41                 +---------+------------------+-----+--------+-------+ +-------+-----------+-----------+------------+------------+  ABI/TBIToday's ABIToday's TBIPrevious ABIPrevious TBI +-------+-----------+-----------+------------+------------+ Right  .42        .20        .79         .38          +-------+-----------+-----------+------------+------------+ Left   .86        .41        1.10        .53          +-------+-----------+-----------+------------+------------+ Right ABIs and TBIs appear decreased compared to prior study on 12/13/20.  Summary: Right: Resting right ankle-brachial index indicates severe right lower extremity arterial disease. The right toe-brachial index is abnormal. Significant decrease in flow s/p thrombectomy. Left: Resting left ankle-brachial index indicates mild left lower extremity arterial disease. The left toe-brachial index is abnormal.  *See table(s) above for measurements and observations.  Electronically signed by [name] MD on 01/12/2021 at 3:01:48 PM.    Final    VAS [place] ABI WITH/WO TBI  Result Date: 12/17/2020 LOWER EXTREMITY DOPPLER STUDY Indications: Rest pain, peripheral artery disease, and Painful cold Lt Foot.  Vascular Interventions: 11/17/2019: Aortogram and Selective Angiogram including                         selctive imaging of the Left ATA and PTA. PTA of the                         Left Anterior Tibial Artery. PTA of the Left Posterior                         tibial Artery. Mechanical Thrombectomy to both the Left                         ATA and PTA.                          11/19/2019: Aortogram and Bilateral Iliofemoral                         Angiograms. Mechanical thrombectomy to the Left CIA and                         EIA. Kissing balloon expandable stent placement to                         bilateral CIA's. Comparison Study: 08/20/2020 Performing Technologist: Concha Norway RVT  Examination Guidelines: A  complete evaluation includes at minimum, Doppler waveform signals and systolic blood pressure reading at the level of bilateral brachial, anterior tibial, and posterior tibial arteries, when vessel segments are accessible. Bilateral testing is considered an integral part of a complete examination. Photoelectric Plethysmograph (PPG) waveforms and toe systolic pressure readings are included as required and additional duplex testing as needed. Limited examinations for reoccurring indications may be performed as noted.  ABI Findings: +---------+------------------+-----+----------+--------+ Right    Rt Pressure (mmHg)IndexWaveform  Comment  +---------+------------------+-----+----------+--------+ Brachial 142                                       +---------+------------------+-----+----------+--------+ ATA      110  0.77 monophasic         +---------+------------------+-----+----------+--------+ PTA      112               0.79 biphasic           +---------+------------------+-----+----------+--------+ Great Toe54                0.38 Abnormal           +---------+------------------+-----+----------+--------+ +---------+------------------+-----+----------+-------+ Left     Lt Pressure (mmHg)IndexWaveform  Comment +---------+------------------+-----+----------+-------+ ATA      156               1.10 biphasic          +---------+------------------+-----+----------+-------+ PTA      141               0.99 monophasic        +---------+------------------+-----+----------+-------+ Great Toe75                0.53 Normal            +---------+------------------+-----+----------+-------+ +-------+-----------+-----------+------------+------------+ ABI/TBIToday's ABIToday's TBIPrevious ABIPrevious TBI +-------+-----------+-----------+------------+------------+ Right  .79        .38                                  +-------+-----------+-----------+------------+------------+ Left   1.10       .53                                 +-------+-----------+-----------+------------+------------+ Right ABIs and TBIs appear decreased compared to prior study on 07/2020.  Summary: Right: Resting right ankle-brachial index indicates moderate right lower extremity arterial disease. The right toe-brachial index is abnormal. Left: Resting left ankle-brachial index is within normal range. No evidence of significant left lower extremity arterial disease. The left toe-brachial index is abnormal.  *See table(s) above for measurements and observations.  Electronically signed by [name] MD on 12/17/2020 at 11:59:36 AM.    Final        Assessment & Plan:   1. 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  This represents a limb threatening ischemia and places the patient at the risk for limb loss.  Patient should undergo angiography of the lower extremities with the hope for intervention for limb salvage.  The risks and benefits as well as the alternative therapies was discussed in detail with the patient.  All questions were answered.  Patient agrees to proceed with angiography.  The patient will follow up with me in the office after the procedure.       2. Tobacco abuse counseling Smoking cessation was discussed, 3-10 minutes spent on this topic specifically   3. Hyperlipidemia, unspecified hyperlipidemia type Continue statin as ordered and reviewed, no changes at this time   4. Essential hypertension with goal blood pressure less than 140/90 Continue antihypertensive medications as already ordered, these medications have been reviewed and there are no changes at this  time.    Current Outpatient Medications on File Prior to Visit  Medication Sig Dispense Refill  . amLODipine (NORVASC) 5 MG tablet Take 1 tablet (5 mg total) by mouth daily. 30 tablet 2  . apixaban (  ELIQUIS) 5 MG TABS tablet Take 5 mg by mouth 2 (two) times daily.    [name] aspirin EC 81 MG tablet Take 81 mg by mouth daily. Swallow whole.    [name] atorvastatin (LIPITOR) 80 MG tablet Take 80 mg by mouth at bedtime.    . citalopram (CELEXA) 20 MG tablet Take 40 mg by mouth daily.    . ferrous sulfate 325 (65 FE) MG tablet Take 325 mg by mouth daily.    . furosemide (LASIX) 40 MG tablet Take by mouth.    . Ipratropium-Albuterol (COMBIVENT) 20-100 MCG/ACT AERS respimat Inhale into the lungs.    . metoprolol tartrate (LOPRESSOR) 50 MG tablet Take 1 tablet (50 mg total) by mouth 2 (two) times daily. 30 tablet 0  . Multiple Vitamin (MULTIVITAMIN WITH MINERALS) TABS tablet Take 1 tablet by mouth daily. 90 tablet 3  . omeprazole (PRILOSEC) 20 MG capsule Take 20 mg by mouth 2 (two) times daily before a meal.    . pregabalin (LYRICA) 100 MG capsule Take 100 mg by mouth 3 (three) times daily.    . pregabalin (LYRICA) 200 MG capsule Take 1 capsule (200 mg total) by mouth 2 (two) times daily. 90 capsule 0  . albuterol (VENTOLIN HFA) 108 (90 Base) MCG/ACT inhaler Inhale 2 puffs into the lungs every 6 (six) hours as needed for wheezing or shortness of breath.  (Patient not taking: No sig reported)    . buPROPion (WELLBUTRIN XL) 150 MG 24 hr tablet Take 150 mg by mouth daily.     [name] oxybutynin (DITROPAN-XL) 10 MG 24 hr tablet Take 1 tablet by mouth daily. (Patient not taking: No sig reported)    . topiramate (TOPAMAX) 200 MG tablet Take 1 tablet by mouth 2 (two) times daily. (Patient not taking: No sig reported)    . torsemide (DEMADEX) 20 MG tablet Take 20 mg by mouth daily. (Patient not taking: No sig reported)     No current facility-administered medications on file prior to visit.    There are no Patient  Instructions on file for this visit. No follow-ups on file.   Kris Hartmann, NP

## 2021-01-21 NOTE — H&P (View-Only) (Signed)
Subjective:    Patient ID: Carla [name], female    DOB: Sep 24, 1961, 60 y.o.   MRN: [id number] Chief Complaint  Patient presents with  . 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  The patient returns to the office for followup and review of the noninvasive studies. There has been a significant deterioration in the lower extremity symptoms.  The patient notes interval shortening of their claudication distance and development of mild rest pain symptoms. No new ulcers or wounds have occurred since the last visit.  However she does have discoloration of her right lower extremity.  The patient also notes having falls due to severe weakness in her right lower extremity.  There have been no significant changes to the patient's overall health care.  The patient denies amaurosis fugax or recent TIA symptoms. There are no recent neurological changes noted. The patient denies history of DVT, PE or superficial thrombophlebitis. The patient denies recent episodes of angina or shortness of breath.   The patient's left iliac system is widely patent.  There is a new stent in the right common iliac and appears widely patent.  The right external iliac artery in-stent is patent but there shows dampened monophasic flow suggesting a possible high-grade stenosis proximally or a possible kink between the common iliac artery and external iliac artery.  There is very low monophasic flow throughout the right lower extremity.   Review of Systems  Cardiovascular:       Claudication  Skin: Positive for color change.  Neurological: Positive for weakness.  All other systems reviewed and are negative.      Objective:   Physical Exam Vitals reviewed.  HENT:     Head: Normocephalic.  Cardiovascular:     Rate and Rhythm: Normal rate.     Pulses:          Dorsalis pedis pulses are 0 on the right side and 1+ on the left side.        Posterior tibial pulses are 0 on the right side and 1+ on the left side.  Pulmonary:     Effort: Pulmonary effort is normal.  Skin:    General: Skin is warm.     Coloration: Skin is cyanotic (Right toes).  Neurological:     Mental Status: She is alert and oriented to person, place, and time.     Motor: Weakness present.     Gait: Gait abnormal.  Psychiatric:        Attention and Perception: Attention normal.        Mood and Affect: Mood normal. Affect is tearful.        Behavior: Behavior normal.        Thought Content: Thought content normal.        Judgment: Judgment normal.     BP 120/77 (BP Location: Right Arm)   Pulse 69   Resp 16   Ht 5\' 2"  (1.575 m)   Wt 227 lb (103 kg)   BMI 41.52 kg/m   Past Medical History:  Diagnosis Date  . Abdominal aortic atherosclerosis ([place])    a. 05/2017 CTA abd/pelvis: significant atherosclerotic dzs of the infrarenal abd Ao w/ some mural thrombus. No aneurysm or dissection.  . Abdominal pain   . Acute focal ischemia of small intestine (H[facility])   . Acute right-sided low back pain with right-sided sciatica 06/13/2017  . AKI (acute kidney injury) ([place])  07/29/2017  . Baker's cyst of knee, right    a. 07/2016 U/S: 4.1 x 1.4 x 2.9 cystic structure in R poplitetal fossa.  . Bell's palsy   . Borderline diabetes   . Cancer ([place])    skin cancer on nose  . Chest pain    a. 08/2012 Lexiscan MV: EF 54%, non ischemia/infarct.  . CHF (congestive heart failure) ([place])   . Chronic idiopathic constipation   . Chronic mesenteric ischemia ([place])   . COPD (chronic obstructive pulmonary disease) ([place])   . Diastolic dysfunction    a. 05/2017 Echo: Ef 60-65%, no rwma, Gr1 DD, no source of cardiac emboli.  . 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 05/2017 CTA Abd/pelvis: apparent thrombus or embolus in prox SMA (70-90%); b. 05/2017 catheter directed tPA, mechanical thrombectomy, and stenting of the SMA.  . Essential hypertension with goal blood  pressure less than 140/90 01/19/2016  . Gastroesophageal reflux disease 01/19/2016  . GERD (gastroesophageal reflux disease)   . H/O colectomy   . History of kidney stones   . Hyperlipidemia   . Hypertension   . Hypotension 07/29/2017  . Morbid obesity with BMI of 40.0-44.9, adult ([place])   . Occlusive mesenteric ischemia ([place]) 06/19/2017  . Personal history of other malignant neoplasm of skin 06/01/2016  . Pre-diabetes   . Primary osteoarthritis of both knees 06/13/2017  . Recurrent major depressive disorder ([place]) 01/19/2016  . SBO (small bowel obstruction) ([place]) 08/06/2017  . 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 Urinary tract infection 01/09/2014    Social History   Socioeconomic History  . Marital status: Significant Other    Spouse name: [name]   . Number of children: Not on file  . Years of education: Not on file  . Highest education level: Not on file  Occupational History  . Occupation: Unemployed  Tobacco Use  . Smoking status: Current Some Day Smoker    Packs/day: 0.10    Types: Cigarettes  . Smokeless tobacco: Never Used  Vaping Use  . Vaping Use: Never used  Substance and Sexual Activity  . Alcohol use: No  . Drug use: No  . Sexual activity: Not on file  Other Topics Concern  . Not on file  Social History Narrative   Lives in [place] with boyfriend.  Does not routinely exercise.   Social Determinants of Health   Financial Resource Strain: Not on file  Food Insecurity: Not on file  Transportation Needs: Not on file  Physical Activity: Not on file  Stress: Not on file  Social Connections: Not on file  Intimate Partner Violence: Not on file    Past Surgical History:  Procedure Laterality Date  . AMPUTATION TOE Left 12/09/2019   Procedure: AMPUTATION TOE MPJ left;  Surgeon: Caroline More, DPM;  Location: [facility];  Service: Podiatry;  Laterality: Left;  . APPENDECTOMY    . CHOLECYSTECTOMY    . LAPAROTOMY N/A 08/08/2017   Procedure: EXPLORATORY  LAPAROTOMY POSSIBLE BOWEL RESECTION;  Surgeon: Jules Husbands, MD;  Location: [facility];  Service: General;  Laterality: N/A;  . LOWER EXTREMITY ANGIOGRAPHY Left 11/17/2019   Procedure: LOWER EXTREMITY ANGIOGRAPHY;  Surgeon: Algernon Huxley, MD;  Location: [facility];  Service: Cardiovascular;  Laterality: Left;  . LOWER EXTREMITY ANGIOGRAPHY Right 12/13/2020   Procedure: LOWER EXTREMITY ANGIOGRAPHY;  Surgeon: Algernon Huxley, MD;  Location: [facility];  Service: Cardiovascular;  Laterality: Right;  . LOWER EXTREMITY ANGIOGRAPHY  Right 01/10/2021   Procedure: LOWER EXTREMITY ANGIOGRAPHY;  Surgeon: Algernon Huxley, MD;  Location: [facility];  Service: Cardiovascular;  Laterality: Right;  . LOWER EXTREMITY INTERVENTION N/A 11/19/2019   Procedure: LOWER EXTREMITY INTERVENTION;  Surgeon: Algernon Huxley, MD;  Location: [facility];  Service: Cardiovascular;  Laterality: N/A;  . TEE WITHOUT CARDIOVERSION N/A 06/22/2017   Procedure: TRANSESOPHAGEAL ECHOCARDIOGRAM (TEE);  Surgeon: Wellington Hampshire, MD;  Location: [facility];  Service: Cardiovascular;  Laterality: N/A;  . TOTAL KNEE ARTHROPLASTY Right 07/11/2018   Procedure: TOTAL KNEE ARTHROPLASTY;  Surgeon: Corky Mull, MD;  Location: [facility];  Service: Orthopedics;  Laterality: Right;  [name] VAGINAL HYSTERECTOMY    . VISCERAL ARTERY INTERVENTION N/A 06/20/2017   Procedure: Visceral Artery Intervention, possible aortic thrombectomy;  Surgeon: Algernon Huxley, MD;  Location: [place];  Service: Cardiovascular;  Laterality: N/A;  . VISCERAL ARTERY INTERVENTION N/A 01/28/2018   Procedure: VISCERAL ARTERY INTERVENTION;  Surgeon: Algernon Huxley, MD;  Location: [facility];  Service: Cardiovascular;  Laterality: N/A;    Family History  Problem Relation Age of Onset  . Hypertension Mother   . Heart disease Mother   . Heart attack Mother   . Breast cancer Mother   . Hypertension Father   . Breast cancer Paternal Aunt      Allergies  Allergen Reactions  . Gabapentin     Tremors   . Bactrim [Sulfamethoxazole-Trimethoprim] Itching    CBC Latest Ref Rng & Units 11/23/2019 11/21/2019 11/20/2019  WBC 4.0 - 10.5 K/uL 6.0 5.4 5.8  Hemoglobin 12.0 - 15.0 g/dL 10.9(L) 10.8(L) 10.8(L)  Hematocrit 36.0 - 46.0 % 34.8(L) 34.3(L) 34.7(L)  Platelets 150 - 400 K/uL 288 210 174      CMP     Component Value Date/Time   NA 139 09/30/2020 1124   NA 140 02/12/2015 1137   K 3.7 09/30/2020 1124   K 2.9 (L) 02/12/2015 1137   CL 103 09/30/2020 1124   CL 107 02/12/2015 1137   CO2 24 09/30/2020 1124   CO2 27 02/12/2015 1137   GLUCOSE 146 (H) 09/30/2020 1124   GLUCOSE 120 (H) 02/12/2015 1137   BUN 14 01/10/2021 1107   BUN 15 02/12/2015 1137   CREATININE 0.96 01/10/2021 1107   CREATININE 0.78 02/12/2015 1137   CALCIUM 9.2 09/30/2020 1124   CALCIUM 9.0 02/12/2015 1137   PROT 7.0 09/30/2020 1124   PROT 7.0 09/14/2020 1139   PROT 7.1 01/19/2015 1112   ALBUMIN 3.3 (L) 09/30/2020 1124   ALBUMIN 3.9 09/14/2020 1139   ALBUMIN 3.8 01/19/2015 1112   AST 15 09/30/2020 1124   AST 17 01/19/2015 1112   ALT 13 09/30/2020 1124   ALT 11 (L) 01/19/2015 1112   ALKPHOS 89 09/30/2020 1124   ALKPHOS 84 01/19/2015 1112   BILITOT 0.5 09/30/2020 1124   BILITOT <0.2 09/14/2020 1139   BILITOT 0.5 01/19/2015 1112   GFRNONAA >60 01/10/2021 1107   GFRNONAA >60 02/12/2015 1137   GFRAA >60 11/23/2019 1330   GFRAA >60 02/12/2015 1137     VAS [place] ABI WITH/WO TBI  Result Date: 01/12/2021 LOWER EXTREMITY DOPPLER STUDY Indications: Rest pain, peripheral artery disease, and Painful cold Lt Foot.  Vascular Interventions: 12/13/20 right fem endart                         11/17/2019: Aortogram and Selective Angiogram including  selctive imaging of the Left ATA and PTA. PTA of the                         Left Anterior Tibial Artery. PTA of the Left Posterior                         tibial Artery. Mechanical Thrombectomy  to both the Left                         ATA and PTA.                          11/19/2019: Aortogram and Bilateral Iliofemoral                         Angiograms. Mechanical thrombectomy to the Left CIA and                         EIA. Kissing balloon expandable stent placement to                         bilateral CIA's. Comparison Study: 12/04/19 Performing Technologist: Concha Norway RVT  Examination Guidelines: A complete evaluation includes at minimum, Doppler waveform signals and systolic blood pressure reading at the level of bilateral brachial, anterior tibial, and posterior tibial arteries, when vessel segments are accessible. Bilateral testing is considered an integral part of a complete examination. Photoelectric Plethysmograph (PPG) waveforms and toe systolic pressure readings are included as required and additional duplex testing as needed. Limited examinations for reoccurring indications may be performed as noted.  ABI Findings: +---------+------------------+-----+--------+--------+ Right    Rt Pressure (mmHg)IndexWaveformComment  +---------+------------------+-----+--------+--------+ Brachial 147                                     +---------+------------------+-----+--------+--------+ ATA      62                0.42                  +---------+------------------+-----+--------+--------+ PTA      44                0.30                  +---------+------------------+-----+--------+--------+ Great Toe29                0.20                  +---------+------------------+-----+--------+--------+ +---------+------------------+-----+--------+-------+ Left     Lt Pressure (mmHg)IndexWaveformComment +---------+------------------+-----+--------+-------+ ATA      108               0.73                 +---------+------------------+-----+--------+-------+ PTA      127               0.86                 +---------+------------------+-----+--------+-------+ Great  Toe61                0.41                 +---------+------------------+-----+--------+-------+ +-------+-----------+-----------+------------+------------+  ABI/TBIToday's ABIToday's TBIPrevious ABIPrevious TBI +-------+-----------+-----------+------------+------------+ Right  .42        .20        .79         .38          +-------+-----------+-----------+------------+------------+ Left   .86        .41        1.10        .53          +-------+-----------+-----------+------------+------------+ Right ABIs and TBIs appear decreased compared to prior study on 12/13/20.  Summary: Right: Resting right ankle-brachial index indicates severe right lower extremity arterial disease. The right toe-brachial index is abnormal. Significant decrease in flow s/p thrombectomy. Left: Resting left ankle-brachial index indicates mild left lower extremity arterial disease. The left toe-brachial index is abnormal.  *See table(s) above for measurements and observations.  Electronically signed by [name] MD on 01/12/2021 at 3:01:48 PM.    Final    VAS [place] ABI WITH/WO TBI  Result Date: 12/17/2020 LOWER EXTREMITY DOPPLER STUDY Indications: Rest pain, peripheral artery disease, and Painful cold Lt Foot.  Vascular Interventions: 11/17/2019: Aortogram and Selective Angiogram including                         selctive imaging of the Left ATA and PTA. PTA of the                         Left Anterior Tibial Artery. PTA of the Left Posterior                         tibial Artery. Mechanical Thrombectomy to both the Left                         ATA and PTA.                          11/19/2019: Aortogram and Bilateral Iliofemoral                         Angiograms. Mechanical thrombectomy to the Left CIA and                         EIA. Kissing balloon expandable stent placement to                         bilateral CIA's. Comparison Study: 08/20/2020 Performing Technologist: Concha Norway RVT  Examination Guidelines: A  complete evaluation includes at minimum, Doppler waveform signals and systolic blood pressure reading at the level of bilateral brachial, anterior tibial, and posterior tibial arteries, when vessel segments are accessible. Bilateral testing is considered an integral part of a complete examination. Photoelectric Plethysmograph (PPG) waveforms and toe systolic pressure readings are included as required and additional duplex testing as needed. Limited examinations for reoccurring indications may be performed as noted.  ABI Findings: +---------+------------------+-----+----------+--------+ Right    Rt Pressure (mmHg)IndexWaveform  Comment  +---------+------------------+-----+----------+--------+ Brachial 142                                       +---------+------------------+-----+----------+--------+ ATA      110  0.77 monophasic         +---------+------------------+-----+----------+--------+ PTA      112               0.79 biphasic           +---------+------------------+-----+----------+--------+ Great Toe54                0.38 Abnormal           +---------+------------------+-----+----------+--------+ +---------+------------------+-----+----------+-------+ Left     Lt Pressure (mmHg)IndexWaveform  Comment +---------+------------------+-----+----------+-------+ ATA      156               1.10 biphasic          +---------+------------------+-----+----------+-------+ PTA      141               0.99 monophasic        +---------+------------------+-----+----------+-------+ Great Toe75                0.53 Normal            +---------+------------------+-----+----------+-------+ +-------+-----------+-----------+------------+------------+ ABI/TBIToday's ABIToday's TBIPrevious ABIPrevious TBI +-------+-----------+-----------+------------+------------+ Right  .79        .38                                  +-------+-----------+-----------+------------+------------+ Left   1.10       .53                                 +-------+-----------+-----------+------------+------------+ Right ABIs and TBIs appear decreased compared to prior study on 07/2020.  Summary: Right: Resting right ankle-brachial index indicates moderate right lower extremity arterial disease. The right toe-brachial index is abnormal. Left: Resting left ankle-brachial index is within normal range. No evidence of significant left lower extremity arterial disease. The left toe-brachial index is abnormal.  *See table(s) above for measurements and observations.  Electronically signed by [name] MD on 12/17/2020 at 11:59:36 AM.    Final        Assessment & Plan:   1. 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  This represents a limb threatening ischemia and places the patient at the risk for limb loss.  Patient should undergo angiography of the lower extremities with the hope for intervention for limb salvage.  The risks and benefits as well as the alternative therapies was discussed in detail with the patient.  All questions were answered.  Patient agrees to proceed with angiography.  The patient will follow up with me in the office after the procedure.       2. Tobacco abuse counseling Smoking cessation was discussed, 3-10 minutes spent on this topic specifically   3. Hyperlipidemia, unspecified hyperlipidemia type Continue statin as ordered and reviewed, no changes at this time   4. Essential hypertension with goal blood pressure less than 140/90 Continue antihypertensive medications as already ordered, these medications have been reviewed and there are no changes at this  time.    Current Outpatient Medications on File Prior to Visit  Medication Sig Dispense Refill  . amLODipine (NORVASC) 5 MG tablet Take 1 tablet (5 mg total) by mouth daily. 30 tablet 2  . apixaban (  ELIQUIS) 5 MG TABS tablet Take 5 mg by mouth 2 (two) times daily.    [name] aspirin EC 81 MG tablet Take 81 mg by mouth daily. Swallow whole.    [name] atorvastatin (LIPITOR) 80 MG tablet Take 80 mg by mouth at bedtime.    . citalopram (CELEXA) 20 MG tablet Take 40 mg by mouth daily.    . ferrous sulfate 325 (65 FE) MG tablet Take 325 mg by mouth daily.    . furosemide (LASIX) 40 MG tablet Take by mouth.    . Ipratropium-Albuterol (COMBIVENT) 20-100 MCG/ACT AERS respimat Inhale into the lungs.    . metoprolol tartrate (LOPRESSOR) 50 MG tablet Take 1 tablet (50 mg total) by mouth 2 (two) times daily. 30 tablet 0  . Multiple Vitamin (MULTIVITAMIN WITH MINERALS) TABS tablet Take 1 tablet by mouth daily. 90 tablet 3  . omeprazole (PRILOSEC) 20 MG capsule Take 20 mg by mouth 2 (two) times daily before a meal.    . pregabalin (LYRICA) 100 MG capsule Take 100 mg by mouth 3 (three) times daily.    . pregabalin (LYRICA) 200 MG capsule Take 1 capsule (200 mg total) by mouth 2 (two) times daily. 90 capsule 0  . albuterol (VENTOLIN HFA) 108 (90 Base) MCG/ACT inhaler Inhale 2 puffs into the lungs every 6 (six) hours as needed for wheezing or shortness of breath.  (Patient not taking: No sig reported)    . buPROPion (WELLBUTRIN XL) 150 MG 24 hr tablet Take 150 mg by mouth daily.     [name] oxybutynin (DITROPAN-XL) 10 MG 24 hr tablet Take 1 tablet by mouth daily. (Patient not taking: No sig reported)    . topiramate (TOPAMAX) 200 MG tablet Take 1 tablet by mouth 2 (two) times daily. (Patient not taking: No sig reported)    . torsemide (DEMADEX) 20 MG tablet Take 20 mg by mouth daily. (Patient not taking: No sig reported)     No current facility-administered medications on file prior to visit.    There are no Patient  Instructions on file for this visit. No follow-ups on file.   Kris Hartmann, NP

## 2021-01-21 NOTE — Telephone Encounter (Signed)
Patient was seen in office and scheduled for a RLE angio on 01/24/21 with a 3:15 pm arrival to the [facility]. Covid testing on 01/21/21 before 11:00 am at the [facility]. Pre-procedure instructions were discussed and handed to patient.

## 2021-01-22 LAB — SARS CORONAVIRUS 2 (TAT 6-24 HRS): SARS Coronavirus 2: NEGATIVE

## 2021-01-23 ENCOUNTER — Other Ambulatory Visit (INDEPENDENT_AMBULATORY_CARE_PROVIDER_SITE_OTHER): Payer: Self-pay | Admitting: Nurse Practitioner

## 2021-01-24 ENCOUNTER — Other Ambulatory Visit: Payer: Self-pay

## 2021-01-24 ENCOUNTER — Ambulatory Visit (INDEPENDENT_AMBULATORY_CARE_PROVIDER_SITE_OTHER): Payer: Medicare Other | Admitting: Surgery

## 2021-01-24 ENCOUNTER — Encounter: Payer: Self-pay | Admitting: Vascular Surgery

## 2021-01-24 ENCOUNTER — Encounter: Payer: Self-pay | Admitting: Surgery

## 2021-01-24 ENCOUNTER — Encounter
Admission: RE | Disposition: A | Discharge status: Home / Self Care | Payer: Self-pay | Source: Home / Self Care | Attending: Vascular Surgery

## 2021-01-24 ENCOUNTER — Observation Stay
Admission: RE | Admit: 2021-01-24 | Discharge: 2021-01-27 | Disposition: A | Discharge status: Home / Self Care | Payer: Medicare Other | Attending: Vascular Surgery | Admitting: Vascular Surgery

## 2021-01-24 VITALS — BP 104/72 | HR 94 | Temp 98.5°F | Ht 63.0 in | Wt 233.2 lb

## 2021-01-24 DIAGNOSIS — Z6841 Body Mass Index (BMI) 40.0 and over, adult: Secondary | ICD-10-CM | POA: Diagnosis not present

## 2021-01-24 DIAGNOSIS — E785 Hyperlipidemia, unspecified: Secondary | ICD-10-CM | POA: Insufficient documentation

## 2021-01-24 DIAGNOSIS — I743 Embolism and thrombosis of arteries of the lower extremities: Principal | ICD-10-CM | POA: Insufficient documentation

## 2021-01-24 DIAGNOSIS — R7303 Prediabetes: Secondary | ICD-10-CM | POA: Insufficient documentation

## 2021-01-24 DIAGNOSIS — K219 Gastro-esophageal reflux disease without esophagitis: Secondary | ICD-10-CM | POA: Diagnosis not present

## 2021-01-24 DIAGNOSIS — I998 Other disorder of circulatory system: Secondary | ICD-10-CM | POA: Diagnosis present

## 2021-01-24 DIAGNOSIS — Z7951 Long term (current) use of inhaled steroids: Secondary | ICD-10-CM | POA: Diagnosis not present

## 2021-01-24 DIAGNOSIS — I70221 Atherosclerosis of native arteries of extremities with rest pain, right leg: Secondary | ICD-10-CM

## 2021-01-24 DIAGNOSIS — J449 Chronic obstructive pulmonary disease, unspecified: Secondary | ICD-10-CM | POA: Diagnosis not present

## 2021-01-24 DIAGNOSIS — K432 Incisional hernia without obstruction or gangrene: Secondary | ICD-10-CM

## 2021-01-24 DIAGNOSIS — I70229 Atherosclerosis of native arteries of extremities with rest pain, unspecified extremity: Secondary | ICD-10-CM

## 2021-01-24 DIAGNOSIS — Z7901 Long term (current) use of anticoagulants: Secondary | ICD-10-CM | POA: Insufficient documentation

## 2021-01-24 DIAGNOSIS — I5030 Unspecified diastolic (congestive) heart failure: Secondary | ICD-10-CM | POA: Insufficient documentation

## 2021-01-24 DIAGNOSIS — Z79899 Other long term (current) drug therapy: Secondary | ICD-10-CM | POA: Insufficient documentation

## 2021-01-24 DIAGNOSIS — I110 Hypertensive heart disease with heart failure: Secondary | ICD-10-CM | POA: Insufficient documentation

## 2021-01-24 DIAGNOSIS — I70212 Atherosclerosis of native arteries of extremities with intermittent claudication, left leg: Secondary | ICD-10-CM

## 2021-01-24 HISTORY — PX: LOWER EXTREMITY ANGIOGRAPHY: CATH118251

## 2021-01-24 LAB — MRSA PCR SCREENING: MRSA by PCR: NEGATIVE

## 2021-01-24 LAB — CREATININE, SERUM
Creatinine, Ser: 0.85 mg/dL (ref 0.44–1.00)
GFR, Estimated: >60 mL/min (ref >60)

## 2021-01-24 LAB — BUN: BUN: 10 mg/dL (ref 8–23)

## 2021-01-24 LAB — GLUCOSE, CAPILLARY: Glucose-Capillary: 98 mg/dL (ref 70–99)

## 2021-01-24 SURGERY — LOWER EXTREMITY ANGIOGRAPHY
Anesthesia: Moderate Sedation | Laterality: Right

## 2021-01-24 MED ORDER — HYDROMORPHONE HCL 1 MG/ML IJ SOLN
0.5000 mg | Freq: Once | INTRAMUSCULAR | Status: AC
Start: 2021-01-24 — End: 2021-01-24

## 2021-01-24 MED ORDER — ATORVASTATIN CALCIUM 20 MG PO TABS
80.0000 mg | ORAL_TABLET | Freq: Every day | ORAL | Status: DC
  Administered 2021-01-24 – 2021-01-26 (×3): 80 mg via ORAL
  Filled 2021-01-24 (×2): qty 1
  Filled 2021-01-24: qty 4
  Filled 2021-01-24: qty 1
  Filled 2021-01-24: qty 4

## 2021-01-24 MED ORDER — KETOROLAC TROMETHAMINE 30 MG/ML IJ SOLN
30.0000 mg | Freq: Four times a day (QID) | INTRAMUSCULAR | Status: AC
  Administered 2021-01-24 – 2021-01-25 (×3): 30 mg via INTRAVENOUS
  Filled 2021-01-24 (×3): qty 1

## 2021-01-24 MED ORDER — METHYLPREDNISOLONE SODIUM SUCC 125 MG IJ SOLR: 125.0000 mg | Freq: Once | INTRAMUSCULAR | Status: DC | PRN

## 2021-01-24 MED ORDER — MIDAZOLAM HCL 2 MG/ML PO SYRP: 8.0000 mg | ORAL_SOLUTION | Freq: Once | ORAL | Status: DC | PRN

## 2021-01-24 MED ORDER — IPRATROPIUM-ALBUTEROL 20-100 MCG/ACT IN AERS
1.0000 | INHALATION_SPRAY | Freq: Four times a day (QID) | RESPIRATORY_TRACT | Status: DC
  Administered 2021-01-24 – 2021-01-27 (×11): 1 via RESPIRATORY_TRACT
  Filled 2021-01-24: qty 4

## 2021-01-24 MED ORDER — HYDROMORPHONE HCL 1 MG/ML IJ SOLN
INTRAMUSCULAR | Status: AC
  Administered 2021-01-24: 0.5 mg via INTRAVENOUS
  Filled 2021-01-24: qty 0.5

## 2021-01-24 MED ORDER — HYDROMORPHONE HCL 1 MG/ML IJ SOLN
INTRAMUSCULAR | Status: AC
  Administered 2021-01-24: 1 mg via INTRAVENOUS
  Filled 2021-01-24: qty 0.5

## 2021-01-24 MED ORDER — HYDROMORPHONE HCL 1 MG/ML IJ SOLN
INTRAMUSCULAR | Status: AC
  Filled 2021-01-24: qty 1

## 2021-01-24 MED ORDER — CEFAZOLIN SODIUM-DEXTROSE 2-4 GM/100ML-% IV SOLN
INTRAVENOUS | Status: AC
  Administered 2021-01-24: 2 g via INTRAVENOUS
  Filled 2021-01-24: qty 100

## 2021-01-24 MED ORDER — HYDROMORPHONE HCL 1 MG/ML IJ SOLN
0.5000 mg | Freq: Once | INTRAMUSCULAR | Status: AC
Start: 2021-01-24 — End: 2021-01-24
  Administered 2021-01-24: 0.5 mg via INTRAVENOUS

## 2021-01-24 MED ORDER — FENTANYL CITRATE (PF) 100 MCG/2ML IJ SOLN
INTRAMUSCULAR | Status: AC
  Filled 2021-01-24: qty 2

## 2021-01-24 MED ORDER — TIROFIBAN HCL IV 12.5 MG/250 ML
INTRAVENOUS | Status: AC
  Administered 2021-01-24: 2642.5 ug via INTRAVENOUS
  Filled 2021-01-24: qty 250

## 2021-01-24 MED ORDER — HEPARIN SODIUM (PORCINE) 1000 UNIT/ML IJ SOLN
INTRAMUSCULAR | Status: AC
  Filled 2021-01-24: qty 1

## 2021-01-24 MED ORDER — FENTANYL CITRATE (PF) 100 MCG/2ML IJ SOLN
INTRAMUSCULAR | Status: DC | PRN
  Administered 2021-01-24: 50 ug via INTRAVENOUS
  Administered 2021-01-24 (×4): 25 ug via INTRAVENOUS

## 2021-01-24 MED ORDER — ADULT MULTIVITAMIN W/MINERALS CH
1.0000 | ORAL_TABLET | Freq: Every day | ORAL | Status: DC
  Administered 2021-01-25 – 2021-01-27 (×3): 1 via ORAL
  Filled 2021-01-24 (×3): qty 1

## 2021-01-24 MED ORDER — PREGABALIN 50 MG PO CAPS
200.0000 mg | ORAL_CAPSULE | Freq: Two times a day (BID) | ORAL | Status: DC
  Administered 2021-01-24 – 2021-01-27 (×6): 200 mg via ORAL
  Filled 2021-01-24: qty 4
  Filled 2021-01-24: qty 8
  Filled 2021-01-24 (×4): qty 4

## 2021-01-24 MED ORDER — TIROFIBAN HCL IN NACL 5-0.9 MG/100ML-% IV SOLN
INTRAVENOUS | Status: AC
  Filled 2021-01-24: qty 100

## 2021-01-24 MED ORDER — HYDROMORPHONE HCL 1 MG/ML IJ SOLN
1.0000 mg | Freq: Once | INTRAMUSCULAR | Status: AC
  Administered 2021-01-24: 1 mg via INTRAVENOUS
  Filled 2021-01-24: qty 1

## 2021-01-24 MED ORDER — FUROSEMIDE 40 MG PO TABS
40.0000 mg | ORAL_TABLET | Freq: Every day | ORAL | Status: DC
  Administered 2021-01-26 – 2021-01-27 (×2): 40 mg via ORAL
  Filled 2021-01-24 (×4): qty 1
  Filled 2021-01-24 (×2): qty 2

## 2021-01-24 MED ORDER — IODIXANOL 320 MG/ML IV SOLN
INTRAVENOUS | Status: DC | PRN
  Administered 2021-01-24: 110 mL

## 2021-01-24 MED ORDER — METOPROLOL TARTRATE 50 MG PO TABS
50.0000 mg | ORAL_TABLET | Freq: Two times a day (BID) | ORAL | Status: DC
  Administered 2021-01-25 – 2021-01-27 (×4): 50 mg via ORAL
  Filled 2021-01-24 (×5): qty 1

## 2021-01-24 MED ORDER — MIDAZOLAM HCL 5 MG/5ML IJ SOLN
INTRAMUSCULAR | Status: AC
  Filled 2021-01-24: qty 5

## 2021-01-24 MED ORDER — SODIUM CHLORIDE 0.9 % IV SOLN: INTRAVENOUS | Status: DC

## 2021-01-24 MED ORDER — ONDANSETRON HCL 4 MG/2ML IJ SOLN: 4.0000 mg | Freq: Four times a day (QID) | INTRAMUSCULAR | Status: DC | PRN

## 2021-01-24 MED ORDER — MIDAZOLAM HCL 2 MG/2ML IJ SOLN
INTRAMUSCULAR | Status: DC | PRN
  Administered 2021-01-24 (×3): 1 mg via INTRAVENOUS
  Administered 2021-01-24: 2 mg via INTRAVENOUS

## 2021-01-24 MED ORDER — FAMOTIDINE 20 MG PO TABS: 40.0000 mg | ORAL_TABLET | Freq: Once | ORAL | Status: DC | PRN

## 2021-01-24 MED ORDER — MORPHINE SULFATE (PF) 2 MG/ML IV SOLN
1.0000 mg | INTRAVENOUS | Status: DC | PRN
  Administered 2021-01-25 (×2): 1 mg via INTRAVENOUS
  Administered 2021-01-25 – 2021-01-26 (×2): 2 mg via INTRAVENOUS
  Filled 2021-01-24 (×4): qty 1

## 2021-01-24 MED ORDER — HYDROMORPHONE HCL 1 MG/ML IJ SOLN: 1.0000 mg | Freq: Once | INTRAMUSCULAR | Status: AC | PRN

## 2021-01-24 MED ORDER — AMLODIPINE BESYLATE 5 MG PO TABS
5.0000 mg | ORAL_TABLET | Freq: Every day | ORAL | Status: DC
  Administered 2021-01-26 – 2021-01-27 (×2): 5 mg via ORAL
  Filled 2021-01-24 (×3): qty 1

## 2021-01-24 MED ORDER — HEPARIN SODIUM (PORCINE) 1000 UNIT/ML IJ SOLN
INTRAMUSCULAR | Status: DC | PRN
  Administered 2021-01-24: 5000 [IU] via INTRAVENOUS

## 2021-01-24 MED ORDER — OXYCODONE HCL 5 MG PO TABS
5.0000 mg | ORAL_TABLET | ORAL | Status: DC | PRN
  Administered 2021-01-25 – 2021-01-26 (×5): 5 mg via ORAL
  Filled 2021-01-24 (×5): qty 1

## 2021-01-24 MED ORDER — LACTATED RINGERS IV BOLUS
500.0000 mL | Freq: Once | INTRAVENOUS | Status: AC
  Administered 2021-01-25: 500 mL via INTRAVENOUS

## 2021-01-24 MED ORDER — CHLORHEXIDINE GLUCONATE CLOTH 2 % EX PADS
6.0000 | MEDICATED_PAD | Freq: Every day | CUTANEOUS | Status: DC
  Administered 2021-01-24 – 2021-01-27 (×4): 6 via TOPICAL

## 2021-01-24 MED ORDER — ORAL CARE MOUTH RINSE
15.0000 mL | Freq: Two times a day (BID) | OROMUCOSAL | Status: DC
  Administered 2021-01-24 – 2021-01-27 (×6): 15 mL via OROMUCOSAL

## 2021-01-24 MED ORDER — ALBUTEROL SULFATE HFA 108 (90 BASE) MCG/ACT IN AERS
2.0000 | INHALATION_SPRAY | Freq: Four times a day (QID) | RESPIRATORY_TRACT | Status: DC | PRN
  Filled 2021-01-24: qty 6.7

## 2021-01-24 MED ORDER — CITALOPRAM HYDROBROMIDE 20 MG PO TABS
40.0000 mg | ORAL_TABLET | Freq: Every day | ORAL | Status: DC
  Administered 2021-01-25 – 2021-01-27 (×3): 40 mg via ORAL
  Filled 2021-01-24 (×3): qty 2

## 2021-01-24 MED ORDER — MORPHINE SULFATE (PF) 4 MG/ML IV SOLN: 2.0000 mg | INTRAVENOUS | Status: DC | PRN

## 2021-01-24 MED ORDER — OXYCODONE HCL 5 MG PO TABS
5.0000 mg | ORAL_TABLET | ORAL | Status: DC | PRN
  Administered 2021-01-24: 10 mg via ORAL
  Filled 2021-01-24: qty 2

## 2021-01-24 MED ORDER — TIROFIBAN HCL IV 12.5 MG/250 ML
0.1500 ug/kg/min | INTRAVENOUS | Status: DC
  Administered 2021-01-24 – 2021-01-25 (×2): 0.15 ug/kg/min via INTRAVENOUS
  Filled 2021-01-24 (×3): qty 100
  Filled 2021-01-24: qty 250

## 2021-01-24 MED ORDER — TIROFIBAN (AGGRASTAT) BOLUS VIA INFUSION
25.0000 ug/kg | Freq: Once | INTRAVENOUS | Status: AC
  Filled 2021-01-24: qty 53

## 2021-01-24 MED ORDER — CEFAZOLIN SODIUM-DEXTROSE 2-4 GM/100ML-% IV SOLN: 2.0000 g | Freq: Once | INTRAVENOUS | Status: AC

## 2021-01-24 MED ORDER — DIPHENHYDRAMINE HCL 50 MG/ML IJ SOLN: 50.0000 mg | Freq: Once | INTRAMUSCULAR | Status: DC | PRN

## 2021-01-24 MED ORDER — PANTOPRAZOLE SODIUM 40 MG PO TBEC
40.0000 mg | DELAYED_RELEASE_TABLET | Freq: Every day | ORAL | Status: DC
  Administered 2021-01-24 – 2021-01-27 (×4): 40 mg via ORAL
  Filled 2021-01-24 (×4): qty 1

## 2021-01-24 SURGICAL SUPPLY — 29 items
BALLN LUTONIX AV 7X60X75 (BALLOONS) ×2
BALLN LUTONIX DCB 7X60X130 (BALLOONS) ×2
BALLN ULTRVRSE 7X100X75 (BALLOONS) ×2
BALLN ULTRVRSE 7X60X75C (BALLOONS) ×2
BALLN ULTRVRSE 8X80X75 (BALLOONS) ×2
BALLOON LUTONIX AV 7X60X75 (BALLOONS) ×1 IMPLANT
BALLOON LUTONIX DCB 7X60X130 (BALLOONS) ×1 IMPLANT
BALLOON ULTRVRSE 7X100X75 (BALLOONS) ×1 IMPLANT
BALLOON ULTRVRSE 7X60X75C (BALLOONS) ×1 IMPLANT
BALLOON ULTRVRSE 8X80X75 (BALLOONS) ×1 IMPLANT
CANISTER PENUMBRA ENGINE (MISCELLANEOUS) ×2 IMPLANT
CANNULA 5F STIFF (CANNULA) ×2 IMPLANT
CATH ANGIO 5F PIGTAIL 65CM (CATHETERS) ×2 IMPLANT
CATH INDIGO 7D KIT (CATHETERS) ×2 IMPLANT
COVER PROBE U/S 5X48 (MISCELLANEOUS) ×4 IMPLANT
DEVICE SAFEGUARD 24CM (GAUZE/BANDAGES/DRESSINGS) ×4 IMPLANT
DEVICE STARCLOSE SE CLOSURE (Vascular Products) ×4 IMPLANT
GLIDEWIRE ADV .035X180CM (WIRE) ×2 IMPLANT
KIT ENCORE 26 ADVANTAGE (KITS) ×4 IMPLANT
PACK ANGIOGRAPHY (CUSTOM PROCEDURE TRAY) ×2 IMPLANT
SHEATH BRITE TIP 5FRX11 (SHEATH) ×2 IMPLANT
SHEATH BRITE TIP 7FRX11 (SHEATH) ×4 IMPLANT
STENT LIFESTAR 8X40 (Permanent Stent) ×2 IMPLANT
STENT VIABAHN 8X7.5X120 (Permanent Stent) ×2 IMPLANT
SYR MEDRAD MARK 7 150ML (SYRINGE) ×2 IMPLANT
TUBING CONTRAST HIGH PRESS 72 (TUBING) ×2 IMPLANT
WIRE G 018X200 V18 (WIRE) ×2 IMPLANT
WIRE GUIDERIGHT .035X150 (WIRE) ×2 IMPLANT
WIRE MAGIC TOR.035 180C (WIRE) ×2 IMPLANT

## 2021-01-24 NOTE — Plan of Care (Signed)
Patient admitted to CCU from OR s/p bilateral LE thrombectomy and balloon angioplasty. 10/10 pain and ordered meds given.    Problem: Education: Goal: Knowledge of General Education information will improve Description: Including pain rating scale, medication(s)/side effects and non-pharmacologic comfort measures Outcome: Progressing   Problem: Health Behavior/Discharge Planning: Goal: Ability to manage health-related needs will improve Outcome: Progressing   Problem: Clinical Measurements: Goal: Ability to maintain clinical measurements within normal limits will improve Outcome: Progressing Goal: Will remain free from infection Outcome: Progressing Goal: Diagnostic test results will improve Outcome: Progressing Goal: Respiratory complications will improve Outcome: Progressing Goal: Cardiovascular complication will be avoided Outcome: Progressing   Problem: Activity: Goal: Risk for activity intolerance will decrease Outcome: Progressing   Problem: Nutrition: Goal: Adequate nutrition will be maintained Outcome: Progressing   Problem: Coping: Goal: Level of anxiety will decrease Outcome: Progressing   Problem: Elimination: Goal: Will not experience complications related to bowel motility Outcome: Progressing Goal: Will not experience complications related to urinary retention Outcome: Progressing   Problem: Pain Managment: Goal: General experience of comfort will improve Outcome: Progressing   Problem: Safety: Goal: Ability to remain free from injury will improve Outcome: Progressing   Problem: Skin Integrity: Goal: Risk for impaired skin integrity will decrease Outcome: Progressing

## 2021-01-24 NOTE — Op Note (Signed)
[facility]  Percutaneous Study/Intervention Procedural Note   Date of Surgery: 01/24/2021  Surgeon(s):Quantisha Marsicano    Assistants:none  Pre-operative Diagnosis: PAD with rest pain RLE  Post-operative diagnosis:  Same  Procedure(s) Performed:             1.  Ultrasound guidance for vascular access bilateral femoral arteries             2.  Catheter placement into aorta from bilateral femoral approaches             3.  Aortogram and bilateral iliofemoral angiogram             4.   Mechanical thrombectomy of the right external and common iliac arteries with penumbra CAT 7D device             5.   Balloon angioplasty of the left common and external iliac arteries with 7 mm diameter angioplasty balloon  6.  Viabahn stent placement to the right common iliac artery with 8 mm diameter by 7.5 cm length stent  7.  Mechanical thrombectomy of the left external and common iliac arteries with penumbra CAT 7D device  8.  Life star stent placement to the left distal common and proximal external iliac arteries with 8 mm diameter by 4 cm length stent             9.  StarClose closure device bilateral femoral arteries  EBL: 250 cc  Contrast: 110 cc  Fluoro Time: 6.8 minutes  Moderate Conscious Sedation Time: approximately 79 minutes using 5 mg of Versed and 150 mcg of Fentanyl              Indications:  Patient is a 60 y.o.female with recurrent rest pain of the right foot despite multiple previous interventions. The patient has noninvasive study showing reocclusion of the right iliac stent. The patient is brought in for angiography for further evaluation and potential treatment.  Due to the limb threatening nature of the situation, angiogram was performed for attempted limb salvage. The patient is aware that if the procedure fails, amputation would be expected.  The patient also understands that even with successful revascularization, amputation may still be required due to the  severity of the situation.  Risks and benefits are discussed and informed consent is obtained.   Procedure:  The patient was identified and appropriate procedural time out was performed.  The patient was then placed supine on the table and prepped and draped in the usual sterile fashion. Moderate conscious sedation was administered during a face to face encounter with the patient throughout the procedure with my supervision of the RN administering medicines and monitoring the patient's vital signs, pulse oximetry, telemetry and mental status throughout from the start of the procedure until the patient was taken to the recovery room. Ultrasound was used to evaluate the left common femoral artery.  It was patent .  A digital ultrasound image was acquired.  A Seldinger needle was used to access the left common femoral artery under direct ultrasound guidance and a permanent image was performed.  A 0.035 J wire was advanced without resistance and a 5Fr sheath was placed.  Pigtail catheter was placed into the aorta and an AP aortogram was performed. This demonstrated normal renal arteries and normal aorta down to the distal segment where the right common iliac stents were occluded.  The left common iliac stents were patent, but the distal edge of the left common iliac stent was  a greater than 70% stenosis it was not clear if it was thrombus or hyperplastic but it had not been seen previously.  Both common femoral arteries appear to be widely patent.  I then used ultrasound to access the right femoral artery under direct ultrasound guidance without difficulty with a micropuncture needle.  A permanent image was recorded.  A micropuncture wire and sheath were then placed and we upsized to a 7 French sheath on the right and heparinized the patient.  I then put a 7 mm balloon up in the left common iliac artery to protect it while we perform mechanical thrombectomy in the right iliac system.  The right common femoral artery  was patent but the stents were occluded on initial image.  2 passes with the penumbra CAT 7D device resulted in a channel of flow although there remained thrombus and stenosis in the common iliac artery.  The external iliac artery in the right was now patent.  I used an 8 mm diameter by 7.5 cm length Viabahn stent started this a few millimeters below the previously placed stent and stopping this at the right hypogastric artery.  This was postdilated with an 8 mm balloon with excellent angiographic completion result and less than 10% residual stenosis.  I then turned my attention to the left.  Upsized to a 7 [place] sheath on the left.  Imaging appeared to show this to be thrombus in the distal left common iliac artery just above the hypogastric artery and at the bottom of the previously placed stent.  A pass with the penumbra CAT 7D device resulted in some improvement of the area but there remained some degree of stenosis and I elected to cover this area with an 8 mm diameter by 4 cm length life star stent as the degree of stenosis appeared to be greater than 50%.  The thrombectomy appeared to resolve the thrombus.  This was postdilated with a 7 mm balloon in the distal common iliac artery and proximal external iliac artery on the left.  Completion imaging showed less than 10% residual stenosis.  Both common femoral arteries appear to be widely patent. I elected to terminate the procedure. The sheath was removed and StarClose closure device was deployed in the right femoral artery with excellent hemostatic result.  Similarly, StarClose closure device deployed on the left with excellent hemostatic result.  The patient was taken to the recovery room in stable condition having tolerated the procedure well.  Findings:               Aortogram:  This demonstrated normal renal arteries and normal aorta down to the distal segment where the right common iliac stents were occluded.  The left common iliac stents were patent,  but the distal edge of the left common iliac stent was a greater than 70% stenosis it was not clear if it was thrombus or hyperplastic but it had not been seen previously.  Both common femoral arteries appear to be widely patent.                Disposition: Patient was taken to the recovery room in stable condition having tolerated the procedure well.  Complications: None  [name] 01/24/2021 5:17 PM   This note was created with Dragon Medical transcription system. Any errors in dictation are purely unintentional.

## 2021-01-24 NOTE — Progress Notes (Signed)
[facility] Progress Note Patient Name: [name] DOB: January 21, 1961 MRN: [id number]   Date of Service  01/24/2021  HPI/Events of Note  Patient is s/p vascular surgery to both lower extremities, and admitted to the ICU for close monitoring for acute arterial  Occlusion with limb ischemia.  eICU Interventions  New Patient Evaluation completed.        Kerry Kass Romain Erion 01/24/2021, 8:03 PM

## 2021-01-24 NOTE — Interval H&P Note (Signed)
History and Physical Interval Note:  01/24/2021 1:43 PM  Carla [name]  has presented today for surgery, with the diagnosis of RT Lower Extremity Angiography   ASO with rest pain  BARD Rep cc: S Willey   Pt to have Covid test on 4-1  Ok'd M [place].  The various methods of treatment have been discussed with the patient and family. After consideration of risks, benefits and other options for treatment, the patient has consented to  Procedure(s): LOWER EXTREMITY ANGIOGRAPHY (Right) as a surgical intervention.  The patient's history has been reviewed, patient examined, no change in status, stable for surgery.  I have reviewed the patient's chart and labs.  Questions were answered to the patient's satisfaction.     [name]

## 2021-01-24 NOTE — Consult Note (Incomplete)
NAME:  Carla [name], MRN:  [id number], DOB:  1961/08/23, LOS: 0 ADMISSION DATE:  01/24/2021, CONSULTATION DATE:  01/24/21 REFERRING MD:  Dr. Lucky Cowboy, CHIEF COMPLAINT:   Pain RLE  History of Present Illness:  60 year old female admitted  Pertinent  Medical History  ***  Significant Hospital Events: Including procedures, antibiotic start and stop dates in addition to other pertinent events   . 01/24/21- Admitted post bilateral thrombectomy of il  Interim History / Subjective:  ***  Objective   Blood pressure 102/68, pulse 85, temperature 97.7 F (36.5 C), temperature source Oral, resp. rate 13, height 5\' 2"  (1.575 m), weight 105.2 kg, SpO2 92 %.        Intake/Output Summary (Last 24 hours) at 01/24/2021 1933 Last data filed at 01/24/2021 1900 Gross per 24 hour  Intake 77.42 ml  Output -  Net 77.42 ml   Filed Weights   01/24/21 1438 01/24/21 1915  Weight: 105.7 kg 105.2 kg    Examination: General: Adult ***, critically***chronically ill, lying in bed intubated & sedated requiring mechanical ventilation *** NAD HEENT: MM pink/moist, anicteric***, atraumatic, neck supple Neuro: A&O x *** commands, PERRL *** , MAE CV: s1s2 ***RRR, *** on monitor, no r/m/g Pulm: Regular, non labored on *** , breath sounds ***-BUL & ***-BLL GI: soft, ***, non***tender, bs x 4 GU: foley in place *** with clear yellow urine Skin: *** no rashes/lesions noted Extremities: warm/dry, pulses + 2 R/P, *** edema noted  Labs/imaging that I {ACTIONS; HAVE/HAVE NOT:19434}personally reviewed  (right click and "Reselect all SmartList Selections" daily)  EKG Interpretation Date: *** EKG Time: *** Rate: *** Rhythm: *** QRS Axis:  *** Intervals: *** ST/T Wave abnormalities: *** Narrative Interpretation: ***  Net: *** Na+/ K+: *** BUN/Cr.: *** Serum CO2/ AG: ***  Hgb: *** Troponin: *** BNP: ***  WBC/ TMAX: *** Lactic/ PCT: ***  ABG: *** CXR ***: *** Resolved Hospital Problem list     Assessment &  Plan:  ***  Best practice (right click and "Reselect all SmartList Selections" daily)  Diet:  {EQAS:34196} Pain/Anxiety/Delirium protocol (if indicated): {Pain/Anxiety/Delirium:26941} VAP protocol (if indicated): {VAP:29640} DVT prophylaxis: {DVT Prophylaxis:26933} GI prophylaxis: {GI:26934} Glucose control:  {Glucose Control:26935} Central venous access:  {Central Venous Access:26936} Arterial line:  {Central Venous Access:26936} Foley:  {Central Venous Access:26936} Mobility:  {Mobility:26937}  PT consulted: {PT Consult:26938} Last date of multidisciplinary goals of care discussion [***] Code Status:  {Code Status:26939} Disposition: ***  Labs   CBC: No results for input(s): WBC, NEUTROABS, HGB, HCT, MCV, PLT in the last 168 hours.  Basic Metabolic Panel: Recent Labs  Lab 01/24/21 1400  BUN 10  CREATININE 0.85   GFR: Estimated Creatinine Clearance: 79.1 mL/min (by C-G formula based on SCr of 0.85 mg/dL). No results for input(s): PROCALCITON, WBC, LATICACIDVEN in the last 168 hours.  Liver Function Tests: No results for input(s): AST, ALT, ALKPHOS, BILITOT, PROT, ALBUMIN in the last 168 hours. No results for input(s): LIPASE, AMYLASE in the last 168 hours. No results for input(s): AMMONIA in the last 168 hours.  ABG    Component Value Date/Time   HCO3 24.3 04/07/2018 2135   ACIDBASEDEF 0.8 04/07/2018 2135   O2SAT 98.2 04/07/2018 2135     Coagulation Profile: No results for input(s): INR, PROTIME in the last 168 hours.  Cardiac Enzymes: No results for input(s): CKTOTAL, CKMB, CKMBINDEX, TROPONINI in the last 168 hours.  HbA1C: Hemoglobin A1C  Date/Time Value Ref Range Status  07/22/2012 05:49 AM 5.7 4.2 -  6.3 % Final    Comment:    The American Diabetes Association recommends that a primary goal of therapy should be <7% and that physicians should reevaluate the treatment regimen in patients with HbA1c values consistently >8%.    Hgb A1c MFr Bld   Date/Time Value Ref Range Status  11/19/2019 04:19 AM 6.3 (H) 4.8 - 5.6 % Final    Comment:    (NOTE) Pre diabetes:          5.7%-6.4% Diabetes:              >6.4% Glycemic control for   <7.0% adults with diabetes     CBG: Recent Labs  Lab 01/24/21 1856  GLUCAP 98    Review of Systems: Positives in BOLD***  Gen: Denies fever, chills, weight change, fatigue, night sweats HEENT: Denies blurred vision, double vision, hearing loss, tinnitus, sinus congestion, rhinorrhea, sore throat, neck stiffness, dysphagia PULM: Denies shortness of breath, cough, sputum production, hemoptysis, wheezing CV: Denies chest pain, edema, orthopnea, paroxysmal nocturnal dyspnea, palpitations GI: Denies abdominal pain, nausea, vomiting, diarrhea, hematochezia, melena, constipation, change in bowel habits GU: Denies dysuria, hematuria, polyuria, oliguria, urethral discharge Endocrine: Denies hot or cold intolerance, polyuria, polyphagia or appetite change Derm: Denies rash, dry skin, scaling or peeling skin change Heme: Denies easy bruising, bleeding, bleeding gums Neuro: Denies headache, numbness, weakness, slurred speech, loss of memory or consciousness Past Medical History:  She,  has a past medical history of Abdominal aortic atherosclerosis (HCC), Abdominal pain, Acute focal ischemia of small intestine ([place]), Acute right-sided low back pain with right-sided sciatica (06/13/2017), AKI (acute kidney injury) ([place]) (07/29/2017), Baker's cyst of knee, right, Bell's palsy, Borderline diabetes, Cancer ([place]), Chest pain, CHF (congestive heart failure) (HCC), Chronic idiopathic constipation, Chronic mesenteric ischemia ([place]), COPD (chronic obstructive pulmonary disease) ([place]), Diastolic dysfunction, 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[place]), Essential hypertension with goal blood pressure less than 140/90 (01/19/2016), Gastroesophageal reflux disease (01/19/2016), GERD (gastroesophageal reflux disease), H/O  colectomy, History of kidney stones, Hyperlipidemia, Hypertension, Hypotension (07/29/2017), Morbid obesity with BMI of 40.0-44.9, adult ([place]), 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   Surgical History:   Past Surgical History:  Procedure Laterality Date  . AMPUTATION TOE Left 12/09/2019   Procedure: AMPUTATION TOE MPJ left;  Surgeon: Caroline More, DPM;  Location: [facility];  Service: Podiatry;  Laterality: Left;  . APPENDECTOMY    . CHOLECYSTECTOMY    . LAPAROTOMY N/A 08/08/2017   Procedure: EXPLORATORY LAPAROTOMY POSSIBLE BOWEL RESECTION;  Surgeon: Jules Husbands, MD;  Location: [facility];  Service: General;  Laterality: N/A;  . LOWER EXTREMITY ANGIOGRAPHY Left 11/17/2019   Procedure: LOWER EXTREMITY ANGIOGRAPHY;  Surgeon: Algernon Huxley, MD;  Location: [facility];  Service: Cardiovascular;  Laterality: Left;  . LOWER EXTREMITY ANGIOGRAPHY Right 12/13/2020   Procedure: LOWER EXTREMITY ANGIOGRAPHY;  Surgeon: Algernon Huxley, MD;  Location: [facility];  Service: Cardiovascular;  Laterality: Right;  . LOWER EXTREMITY ANGIOGRAPHY Right 01/10/2021   Procedure: LOWER EXTREMITY ANGIOGRAPHY;  Surgeon: Algernon Huxley, MD;  Location: [facility];  Service: Cardiovascular;  Laterality: Right;  . LOWER EXTREMITY INTERVENTION N/A 11/19/2019   Procedure: LOWER EXTREMITY INTERVENTION;  Surgeon: Algernon Huxley, MD;  Location: [facility];  Service: Cardiovascular;  Laterality: N/A;  . TEE WITHOUT CARDIOVERSION N/A 06/22/2017   Procedure: TRANSESOPHAGEAL ECHOCARDIOGRAM (TEE);  Surgeon: Wellington Hampshire, MD;  Location: [facility];  Service: Cardiovascular;   Laterality: N/A;  . TOTAL KNEE ARTHROPLASTY Right 07/11/2018   Procedure: TOTAL KNEE ARTHROPLASTY;  Surgeon: Corky Mull, MD;  Location: [facility];  Service: Orthopedics;  Laterality: Right;  [name] VAGINAL HYSTERECTOMY    . VISCERAL ARTERY INTERVENTION N/A 06/20/2017   Procedure: Visceral Artery Intervention, possible aortic thrombectomy;  Surgeon: Algernon Huxley, MD;  Location: [facility];  Service: Cardiovascular;  Laterality: N/A;  . VISCERAL ARTERY INTERVENTION N/A 01/28/2018   Procedure: VISCERAL ARTERY INTERVENTION;  Surgeon: Algernon Huxley, MD;  Location: [facility];  Service: Cardiovascular;  Laterality: N/A;     Social History:   reports that she has been smoking cigarettes. She has been smoking about 0.10 packs per day. She has never used smokeless tobacco. She reports that she does not drink alcohol and does not use drugs.   Family History:  Her family history includes Breast cancer in her mother and paternal aunt; Heart attack in her mother; Heart disease in her mother; Hypertension in her father and mother.   Allergies Allergies  Allergen Reactions  . Gabapentin     Tremors   . Bactrim [Sulfamethoxazole-Trimethoprim] Itching     Home Medications  Prior to Admission medications   Medication Sig Start Date End Date Taking? Authorizing Provider  albuterol (VENTOLIN HFA) 108 (90 Base) MCG/ACT inhaler Inhale 2 puffs into the lungs every 6 (six) hours as needed for wheezing or shortness of breath. 09/03/19  Yes [provider]  amLODipine (NORVASC) 5 MG tablet Take 1 tablet (5 mg total) by mouth daily. 04/10/18  Yes Gladstone Lighter, MD  apixaban (ELIQUIS) 5 MG TABS tablet Take 5 mg by mouth 2 (two) times daily.   Yes [provider]  aspirin EC 81 MG tablet Take 81 mg by mouth daily. Swallow whole.   Yes [provider]  atorvastatin (LIPITOR) 80 MG tablet Take 80 mg by mouth at bedtime.   Yes [provider]  citalopram (CELEXA)  20 MG tablet Take 40 mg by mouth daily. 12/29/20  Yes [provider]  ferrous sulfate 325 (65 FE) MG tablet Take 325 mg by mouth daily.   Yes [provider]  furosemide (LASIX) 40 MG tablet Take by mouth. 08/19/20 08/19/21 Yes [provider]  Ipratropium-Albuterol (COMBIVENT) 20-100 MCG/ACT AERS respimat Inhale into the lungs. 04/15/20 04/15/21 Yes [provider]  metoprolol tartrate (LOPRESSOR) 50 MG tablet Take 1 tablet (50 mg total) by mouth 2 (two) times daily. 06/15/18  Yes Bettey Costa, MD  Multiple Vitamin (MULTIVITAMIN WITH MINERALS) TABS tablet Take 1 tablet by mouth daily. 11/22/19  Yes Stegmayer, Janalyn Harder, PA-C  omeprazole (PRILOSEC) 20 MG capsule Take 20 mg by mouth 2 (two) times daily before a meal.   Yes [provider]  pregabalin (LYRICA) 200 MG capsule Take 1 capsule (200 mg total) by mouth 2 (two) times daily. 11/10/20  Yes Pabon, [place], MD  traMADol (ULTRAM) 50 MG tablet Take 1-2 tablets (50-100 mg total) by mouth every 8 (eight) hours as needed. 01/21/21  Yes Kris Hartmann, NP  buPROPion (WELLBUTRIN XL) 150 MG 24 hr tablet Take 150 mg by mouth daily.  07/03/19 07/02/20  [provider]  torsemide (DEMADEX) 20 MG tablet Take 20 mg by mouth daily. Patient not taking: Reported on 01/24/2021 12/13/20  [provider]     Critical care time: 35 minutes       Venetia Night, AGACNP-BC Acute Care Nurse Practitioner [facility]   [phone] / [phone] Please see Amion for pager details.

## 2021-01-24 NOTE — Consult Note (Signed)
NAME:  Carla [name], MRN:  [id number], DOB:  Jan 11, 1961, LOS: 0 ADMISSION DATE:  01/24/2021, CONSULTATION DATE:  01/24/21 REFERRING MD:  Dr. Lucky Cowboy, CHIEF COMPLAINT:   Pain RLE  History of Present Illness:  60 year old female presenting to [facility] with ongoing complaints of right lower extremity pain scheduled for elective right lower extremity angiography intervention with Dr. Lucky Cowboy.  Dr. Lucky Cowboy completed an aortogram and bilateral iliofemoral angiogram.  Mechanical thrombectomy of the right external and common iliac arteries followed by Viabahn stent placement to the right common iliac artery.  The patient also had balloon angioplasty of the left common and external iliac arteries followed by mechanical thrombectomy of the left external and common iliac artery with stent placement left distal common and proximal external iliac arteries.  Star closure device was used for bilateral femoral arteries.   Post procedure the patient complained of severe right foot pain uncontrolled by Dilaudid PRN's in recovery.  Due to concerns the patient might require more sedatives she was admitted to the ICU with PCCM consulted.  Pertinent  Medical History  Hypertension Hyperlipidemia Morbid obesity COPD HFpEF -LVEF 60 to 65%, G1DD Bell's palsy PAD  Chronic mesenteric ischemia Abdominal aortic atherosclerosis Current smoker Significant Hospital Events: Including procedures, antibiotic start and stop dates in addition to other pertinent events   . 01/24/21- Admitted post bilateral thrombectomy with stent placement of the iliac arteries  Interim History / Subjective:  Patient resting in bed, after being given some pain medicine IV, appearing comfortable.  When roused reports pain in right pinky toe that is 8 out of 10 pre-pain medication and currently " better". Upon examination right foot warm with palpable pedal & posttibial pulse, though slightly fainter than left pedal pulse.  There is also some faint discoloration of  the right pinky toe ICU this is presently  Objective   Blood pressure 102/68, pulse 85, temperature 97.7 F (36.5 C), temperature source Oral, resp. rate 13, height 5\' 2"  (1.575 m), weight 105.2 kg, SpO2 92 %.        Intake/Output Summary (Last 24 hours) at 01/24/2021 1933 Last data filed at 01/24/2021 1900 Gross per 24 hour  Intake 77.42 ml  Output --  Net 77.42 ml   Filed Weights   01/24/21 1438 01/24/21 1915  Weight: 105.7 kg 105.2 kg    Examination: General: Adult female, critically/chronically ill, lying in bed, NAD HEENT: MM pink/moist, anicteric, atraumatic, neck supple Neuro: A&O x 4, able to follow commands, PERRL +3, MAE CV: s1s2 RRR, NSR on monitor, no r/m/g Pulm: Regular, non labored on room air, breath sounds clear-BUL & diminished-BLL GI: soft, rounded, non tender, bs x 4 Skin: No surgical sites, purple discoloration around right pinky toe Extremities: warm/dry, pulses +1 RLE/ +2 LLE, no edema noted  Labs/imaging that I have personally reviewed  (right click and "Reselect all SmartList Selections" daily)  BUN/Cr.:  10/0.85  Resolved Hospital Problem list     Assessment & Plan:  Ischemia right lower extremity status post bilateral mechanical thrombectomy with stent placement in bilateral iliac arteries PMHx: PAD -Continue Aggrastat per primary service -Continue pain control per primary service: Morphine 1 to 2 mg every 3 h, oxycodone 5 to 10 mg every 4 & Toradol every 6 h -Goal SBP greater than 100 -Continuous cardiac monitoring  Current smoker -Smoking cessation advice provided -Nicotine patch   Best practice (right click and "Reselect all SmartList Selections" daily)  Diet:  Oral Pain/Anxiety/Delirium protocol (if indicated): No VAP protocol (if  indicated): Not indicated DVT prophylaxis: Systemic AC GI prophylaxis: N/A Glucose control:  SSI No Central venous access:  N/A Arterial line:  N/A Foley:  N/A Mobility:  bed rest  PT consulted: N/A Last  date of multidisciplinary goals of care discussion 01/24/2021 Code Status:  full code Disposition: ICU  Labs   CBC: No results for input(s): WBC, NEUTROABS, HGB, HCT, MCV, PLT in the last 168 hours.  Basic Metabolic Panel: Recent Labs  Lab 01/24/21 1400  BUN 10  CREATININE 0.85   GFR: Estimated Creatinine Clearance: 79.1 mL/min (by C-G formula based on SCr of 0.85 mg/dL). No results for input(s): PROCALCITON, WBC, LATICACIDVEN in the last 168 hours.  Liver Function Tests: No results for input(s): AST, ALT, ALKPHOS, BILITOT, PROT, ALBUMIN in the last 168 hours. No results for input(s): LIPASE, AMYLASE in the last 168 hours. No results for input(s): AMMONIA in the last 168 hours.  ABG    Component Value Date/Time   HCO3 24.3 04/07/2018 2135   ACIDBASEDEF 0.8 04/07/2018 2135   O2SAT 98.2 04/07/2018 2135     Coagulation Profile: No results for input(s): INR, PROTIME in the last 168 hours.  Cardiac Enzymes: No results for input(s): CKTOTAL, CKMB, CKMBINDEX, TROPONINI in the last 168 hours.  HbA1C: Hemoglobin A1C  Date/Time Value Ref Range Status  07/22/2012 05:49 AM 5.7 4.2 - 6.3 % Final    Comment:    The American Diabetes Association recommends that a primary goal of therapy should be <7% and that physicians should reevaluate the treatment regimen in patients with HbA1c values consistently >8%.    Hgb A1c MFr Bld  Date/Time Value Ref Range Status  11/19/2019 04:19 AM 6.3 (H) 4.8 - 5.6 % Final    Comment:    (NOTE) Pre diabetes:          5.7%-6.4% Diabetes:              >6.4% Glycemic control for   <7.0% adults with diabetes     CBG: Recent Labs  Lab 01/24/21 1856  GLUCAP 98    Review of Systems: Positives in BOLD  Gen: Denies fever, chills, weight change, fatigue, night sweats HEENT: Denies blurred vision, double vision, hearing loss, tinnitus, sinus congestion, rhinorrhea, sore throat, neck stiffness, dysphagia PULM: Denies shortness of breath, cough,  sputum production, hemoptysis, wheezing CV: Denies chest pain, edema, orthopnea, paroxysmal nocturnal dyspnea, palpitations, pain in right foot (PTA)-current pain in right pinky toe GI: Denies abdominal pain, nausea, vomiting, diarrhea, hematochezia, melena, constipation, change in bowel habits GU: Denies dysuria, hematuria, polyuria, oliguria, urethral discharge Endocrine: Denies hot or cold intolerance, polyuria, polyphagia or appetite change Derm: Denies rash, dry skin, scaling or peeling skin change Heme: Denies easy bruising, bleeding, bleeding gums Neuro: Denies headache, numbness, weakness, slurred speech, loss of memory or consciousness Past Medical History:  She,  has a past medical history of Abdominal aortic atherosclerosis (HCC), Abdominal pain, Acute focal ischemia of small intestine ([place]), Acute right-sided low back pain with right-sided sciatica (06/13/2017), AKI (acute kidney injury) ([place]) (07/29/2017), Baker's cyst of knee, right, Bell's palsy, Borderline diabetes, Cancer ([place]), Chest pain, CHF (congestive heart failure) (HCC), Chronic idiopathic constipation, Chronic mesenteric ischemia (HCC), COPD (chronic obstructive pulmonary disease) ([place]), Diastolic dysfunction, 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[place]), Essential hypertension with goal blood pressure less than 140/90 (01/19/2016), Gastroesophageal reflux disease (01/19/2016), GERD (gastroesophageal reflux disease), H/O colectomy, History of kidney stones, Hyperlipidemia, Hypertension, Hypotension (07/29/2017), Morbid obesity with BMI of 40.0-44.9, adult ([place]), Occlusive mesenteric  ischemia ([place]) (06/19/2017), Personal history of other malignant neoplasm of skin (06/01/2016), Pre-diabetes, Primary osteoarthritis of both knees (06/13/2017), Recurrent major depressive disorder ([place]) (01/19/2016), SBO (small bowel obstruction) ([place]) (08/06/2017), 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   Surgical  History:   Past Surgical History:  Procedure Laterality Date  . AMPUTATION TOE Left 12/09/2019   Procedure: AMPUTATION TOE MPJ left;  Surgeon: Caroline More, DPM;  Location: [facility] ORS;  Service: Podiatry;  Laterality: Left;  . APPENDECTOMY    . CHOLECYSTECTOMY    . LAPAROTOMY N/A 08/08/2017   Procedure: EXPLORATORY LAPAROTOMY POSSIBLE BOWEL RESECTION;  Surgeon: Jules Husbands, MD;  Location: [facility] ORS;  Service: General;  Laterality: N/A;  . LOWER EXTREMITY ANGIOGRAPHY Left 11/17/2019   Procedure: LOWER EXTREMITY ANGIOGRAPHY;  Surgeon: Algernon Huxley, MD;  Location: [place] [facility];  Service: Cardiovascular;  Laterality: Left;  . LOWER EXTREMITY ANGIOGRAPHY Right 12/13/2020   Procedure: LOWER EXTREMITY ANGIOGRAPHY;  Surgeon: Algernon Huxley, MD;  Location: [place] [facility];  Service: Cardiovascular;  Laterality: Right;  . LOWER EXTREMITY ANGIOGRAPHY Right 01/10/2021   Procedure: LOWER EXTREMITY ANGIOGRAPHY;  Surgeon: Algernon Huxley, MD;  Location: [place] [facility];  Service: Cardiovascular;  Laterality: Right;  . LOWER EXTREMITY INTERVENTION N/A 11/19/2019   Procedure: LOWER EXTREMITY INTERVENTION;  Surgeon: Algernon Huxley, MD;  Location: [place] [facility];  Service: Cardiovascular;  Laterality: N/A;  . TEE WITHOUT CARDIOVERSION N/A 06/22/2017   Procedure: TRANSESOPHAGEAL ECHOCARDIOGRAM (TEE);  Surgeon: Wellington Hampshire, MD;  Location: [facility] ORS;  Service: Cardiovascular;  Laterality: N/A;  . TOTAL KNEE ARTHROPLASTY Right 07/11/2018   Procedure: TOTAL KNEE ARTHROPLASTY;  Surgeon: Corky Mull, MD;  Location: [facility] OR[facility];  Service: Orthopedics;  Laterality: Right;  [name] VAGINAL HYSTERECTOMY    . VISCERAL ARTERY INTERVENTION N/A 06/20/2017   Procedure: Visceral Artery Intervention, possible aortic thrombectomy;  Surgeon: Algernon Huxley, MD;  Location: [place] [facility];  Service: Cardiovascular;  Laterality: N/A;  . VISCERAL ARTERY INTERVENTION N/A 01/28/2018   Procedure: VISCERAL ARTERY INTERVENTION;   Surgeon: Algernon Huxley, MD;  Location: [place] [facility];  Service: Cardiovascular;  Laterality: N/A;     Social History:   reports that she has been smoking cigarettes. She has been smoking about 0.10 packs per day. She has never used smokeless tobacco. She reports that she does not drink alcohol and does not use drugs.   Family History:  Her family history includes Breast cancer in her mother and paternal aunt; Heart attack in her mother; Heart disease in her mother; Hypertension in her father and mother.   Allergies Allergies  Allergen Reactions  . Gabapentin     Tremors   . Bactrim [Sulfamethoxazole-Trimethoprim] Itching     Home Medications  Prior to Admission medications   Medication Sig Start Date End Date Taking? Authorizing Provider  albuterol (VENTOLIN HFA) 108 (90 Base) MCG/ACT inhaler Inhale 2 puffs into the lungs every 6 (six) hours as needed for wheezing or shortness of breath. 09/03/19  Yes [provider]  amLODipine (NORVASC) 5 MG tablet Take 1 tablet (5 mg total) by mouth daily. 04/10/18  Yes Gladstone Lighter, MD  apixaban (ELIQUIS) 5 MG TABS tablet Take 5 mg by mouth 2 (two) times daily.   Yes [provider]  aspirin EC 81 MG tablet Take 81 mg by mouth daily. Swallow whole.   Yes [provider]  atorvastatin (LIPITOR) 80 MG tablet Take 80 mg  by mouth at bedtime.   Yes [provider]  citalopram (CELEXA) 20 MG tablet Take 40 mg by mouth daily. 12/29/20  Yes [provider]  ferrous sulfate 325 (65 FE) MG tablet Take 325 mg by mouth daily.   Yes [provider]  furosemide (LASIX) 40 MG tablet Take by mouth. 08/19/20 08/19/21 Yes [provider]  Ipratropium-Albuterol (COMBIVENT) 20-100 MCG/ACT AERS respimat Inhale into the lungs. 04/15/20 04/15/21 Yes [provider]  metoprolol tartrate (LOPRESSOR) 50 MG tablet Take 1 tablet (50 mg total) by mouth 2 (two) times daily. 06/15/18  Yes Bettey Costa,  MD  Multiple Vitamin (MULTIVITAMIN WITH MINERALS) TABS tablet Take 1 tablet by mouth daily. 11/22/19  Yes Stegmayer, Janalyn Harder, PA-C  omeprazole (PRILOSEC) 20 MG capsule Take 20 mg by mouth 2 (two) times daily before a meal.   Yes [provider]  pregabalin (LYRICA) 200 MG capsule Take 1 capsule (200 mg total) by mouth 2 (two) times daily. 11/10/20  Yes Pabon, Shelby, MD  traMADol (ULTRAM) 50 MG tablet Take 1-2 tablets (50-100 mg total) by mouth every 8 (eight) hours as needed. 01/21/21  Yes Kris Hartmann, NP  buPROPion (WELLBUTRIN XL) 150 MG 24 hr tablet Take 150 mg by mouth daily.  07/03/19 07/02/20  [provider]  torsemide (DEMADEX) 20 MG tablet Take 20 mg by mouth daily. Patient not taking: Reported on 01/24/2021 12/13/20   [provider]     Critical care time: 35 minutes       Venetia Night, AGACNP-BC Acute Care Nurse Practitioner [facility]   [phone] / [phone] Please see Amion for pager details.

## 2021-01-24 NOTE — Patient Instructions (Addendum)
Please call with any questions or concerns.  Please apply triple antibiotic ointment to the wounds on the hernia.  Please see your follow up appointment listed below.

## 2021-01-24 NOTE — OR Nursing (Signed)
On arrival to specials pt reporting 10/10 pain, after IV started 0.5 mg dilaudid IV given. Pt foot cool and blue, very weak doppler pulses. Pt tearful due to pain. Informed pt need to stop smoking. Right groin site red sore present, groin moist like yeast infection. Vascular staff informed.

## 2021-01-24 NOTE — OR Nursing (Signed)
Pt crying and reporting severe pain right foot post procedure, she received 5 versed IV and 150 mcg Fentanyl intra procedure. Report received that she was snoring asleep at times during procedure. She reported she couldn't pee despite Purewick placment to assist. Dr Lucky Cowboy notified. 16 french foley placed with 450 ml urine return. 1 Mg dilaudid IV given post procedure. Report called to CCU. Dr Lucky Cowboy discussed with pt possible need for more sedation, due to non relief with medications she has received. However at this time pt is asleep.

## 2021-01-25 ENCOUNTER — Encounter: Payer: Self-pay | Admitting: Surgery

## 2021-01-25 ENCOUNTER — Other Ambulatory Visit (INDEPENDENT_AMBULATORY_CARE_PROVIDER_SITE_OTHER): Payer: Self-pay | Admitting: Nurse Practitioner

## 2021-01-25 DIAGNOSIS — I743 Embolism and thrombosis of arteries of the lower extremities: Secondary | ICD-10-CM | POA: Diagnosis not present

## 2021-01-25 LAB — CBC
HCT: 33.8 % — ABNORMAL LOW (ref 36.0–46.0)
Hemoglobin: 10.8 g/dL — ABNORMAL LOW (ref 12.0–15.0)
MCH: 30.3 pg (ref 26.0–34.0)
MCHC: 32 g/dL (ref 30.0–36.0)
MCV: 94.7 fL (ref 80.0–100.0)
Platelets: 217 10*3/uL (ref 150–400)
RBC: 3.57 MIL/uL — ABNORMAL LOW (ref 3.87–5.11)
RDW: 15.3 % (ref 11.5–15.5)
WBC: 6.4 10*3/uL (ref 4.0–10.5)
nRBC: 0 % (ref 0.0–0.2)

## 2021-01-25 LAB — BASIC METABOLIC PANEL
Anion gap: 7 (ref 5–15)
BUN: 13 mg/dL (ref 8–23)
CO2: 25 mmol/L (ref 22–32)
Calcium: 8.6 mg/dL — ABNORMAL LOW (ref 8.9–10.3)
Chloride: 107 mmol/L (ref 98–111)
Creatinine, Ser: 0.97 mg/dL (ref 0.44–1.00)
GFR, Estimated: >60 mL/min (ref >60)
Glucose, Bld: 132 mg/dL — ABNORMAL HIGH (ref 70–99)
Potassium: 4.1 mmol/L (ref 3.5–5.1)
Sodium: 139 mmol/L (ref 135–145)

## 2021-01-25 LAB — PHOSPHORUS: Phosphorus: 5.4 mg/dL — ABNORMAL HIGH (ref 2.5–4.6)

## 2021-01-25 LAB — MAGNESIUM: Magnesium: 2.2 mg/dL (ref 1.7–2.4)

## 2021-01-25 LAB — HIV ANTIBODY (ROUTINE TESTING W REFLEX): HIV Screen 4th Generation wRfx: NONREACTIVE

## 2021-01-25 MED ORDER — APIXABAN 5 MG PO TABS
5.0000 mg | ORAL_TABLET | Freq: Two times a day (BID) | ORAL | Status: DC
  Administered 2021-01-25 – 2021-01-27 (×5): 5 mg via ORAL
  Filled 2021-01-25 (×5): qty 1

## 2021-01-25 MED ORDER — APIXABAN 5 MG PO TABS: 5.0000 mg | ORAL_TABLET | Freq: Two times a day (BID) | ORAL | 11 refills | Status: AC

## 2021-01-25 MED ORDER — KETOROLAC TROMETHAMINE 30 MG/ML IJ SOLN
30.0000 mg | Freq: Four times a day (QID) | INTRAMUSCULAR | Status: AC
  Administered 2021-01-25 (×2): 30 mg via INTRAVENOUS
  Filled 2021-01-25 (×2): qty 1

## 2021-01-25 MED ORDER — OXYCODONE HCL 5 MG PO TABS: 5.0000 mg | ORAL_TABLET | Freq: Four times a day (QID) | ORAL | 0 refills | Status: DC | PRN

## 2021-01-25 MED ORDER — ASPIRIN EC 81 MG PO TBEC
81.0000 mg | DELAYED_RELEASE_TABLET | Freq: Every day | ORAL | Status: DC
  Administered 2021-01-25 – 2021-01-27 (×3): 81 mg via ORAL
  Filled 2021-01-25 (×3): qty 1

## 2021-01-25 MED ORDER — NICOTINE 14 MG/24HR TD PT24
14.0000 mg | MEDICATED_PATCH | Freq: Every day | TRANSDERMAL | Status: DC
  Administered 2021-01-25 – 2021-01-26 (×2): 14 mg via TRANSDERMAL
  Filled 2021-01-25 (×3): qty 1

## 2021-01-25 NOTE — Discharge Instructions (Signed)
You may shower as of tomorrow.  Please keep your groins clean and dry. Please do not lift anything greater than 10 pounds until you are cleared at your first post procedure follow-up.

## 2021-01-25 NOTE — Progress Notes (Signed)
Made Dr. Milon Dikes aware that patient's BP is 93/57 and that Norvasc, lopressor and lasix are due at 1000. MD gave order to hold all three of meds listed above.

## 2021-01-25 NOTE — Progress Notes (Signed)
Outpatient Surgical Follow Up  01/25/2021  Carla [name] is an 60 y.o. female.   Chief Complaint  Patient presents with  . Follow-up    Discuss hernia SX    HPI: 60 year old female well-known to me with large incisional hernia with loss of domain. She does have significant comorbidities to include peripheral vascular disease, smoking, COPD, diabetes and a BMI of 42.  She continues to Smoke 1/4 PPD SHe has significant social issues and definitely she is depressed. SHe is trying to lose weight and she has lost 8 pounds since last visit.  She also had a recent angiography due to some lower extremity pain and claudication.  SHe did undergo right common and external iliac thrombectomy and angioplasty on the left side.  SHe continues to endorse abdominal pain and lower extremity pain.  Currently her leg pain has progressed to rest pain and she is on the schedule to have an angiogram by Dr. Lucky Cowboy  Past Medical History:  Diagnosis Date  . Abdominal aortic atherosclerosis ([place])    a. 05/2017 CTA abd/pelvis: significant atherosclerotic dzs of the infrarenal abd Ao w/ some mural thrombus. No aneurysm or dissection.  . Abdominal pain   . Acute focal ischemia of small intestine (HCC)   . Acute right-sided low back pain with right-sided sciatica 06/13/2017  . AKI (acute kidney injury) ([place]) 07/29/2017  . Baker's cyst of knee, right    a. 07/2016 U/S: 4.1 x 1.4 x 2.9 cystic structure in R poplitetal fossa.  . Bell's palsy   . Borderline diabetes   . Cancer ([place])    skin cancer on nose  . Chest pain    a. 08/2012 Lexiscan MV: EF 54%, non ischemia/infarct.  . CHF (congestive heart failure) ([place])   . Chronic idiopathic constipation   . Chronic mesenteric ischemia ([place])   . COPD (chronic obstructive pulmonary disease) ([place])   . Diastolic dysfunction    a. 05/2017 Echo: Ef 60-65%, no rwma, Gr1 DD, no source of cardiac emboli.  . 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[place])    a. 05/2017 CTA Abd/pelvis:  apparent thrombus or embolus in prox SMA (70-90%); b. 05/2017 catheter directed tPA, mechanical thrombectomy, and stenting of the SMA.  . Essential hypertension with goal blood pressure less than 140/90 01/19/2016  . Gastroesophageal reflux disease 01/19/2016  . GERD (gastroesophageal reflux disease)   . H/O colectomy   . History of kidney stones   . Hyperlipidemia   . Hypertension   . Hypotension 07/29/2017  . Morbid obesity with BMI of 40.0-44.9, adult ([place])   . Occlusive mesenteric ischemia ([place]) 06/19/2017  . Personal history of other malignant neoplasm of skin 06/01/2016  . Pre-diabetes   . Primary osteoarthritis of both knees 06/13/2017  . Recurrent major depressive disorder ([place]) 01/19/2016  . SBO (small bowel obstruction) ([place]) 08/06/2017  . 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 Urinary tract infection 01/09/2014    Past Surgical History:  Procedure Laterality Date  . AMPUTATION TOE Left 12/09/2019   Procedure: AMPUTATION TOE MPJ left;  Surgeon: Caroline More, DPM;  Location: [facility] OR[facility];  Service: Podiatry;  Laterality: Left;  . APPENDECTOMY    . CHOLECYSTECTOMY    . LAPAROTOMY N/A 08/08/2017   Procedure: EXPLORATORY LAPAROTOMY POSSIBLE BOWEL RESECTION;  Surgeon: Jules Husbands, MD;  Location: [facility] OR[facility];  Service: General;  Laterality: N/A;  . LOWER EXTREMITY ANGIOGRAPHY Left 11/17/2019   Procedure: LOWER EXTREMITY ANGIOGRAPHY;  Surgeon: Algernon Huxley, MD;  Location: [facility]  INVASIVE [facility];  Service: Cardiovascular;  Laterality: Left;  . LOWER EXTREMITY ANGIOGRAPHY Right 12/13/2020   Procedure: LOWER EXTREMITY ANGIOGRAPHY;  Surgeon: Algernon Huxley, MD;  Location: [place] [facility];  Service: Cardiovascular;  Laterality: Right;  . LOWER EXTREMITY ANGIOGRAPHY Right 01/10/2021   Procedure: LOWER EXTREMITY ANGIOGRAPHY;  Surgeon: Algernon Huxley, MD;  Location: [place] [facility];  Service: Cardiovascular;  Laterality: Right;  . LOWER EXTREMITY INTERVENTION N/A 11/19/2019   Procedure:  LOWER EXTREMITY INTERVENTION;  Surgeon: Algernon Huxley, MD;  Location: [place] [facility];  Service: Cardiovascular;  Laterality: N/A;  . TEE WITHOUT CARDIOVERSION N/A 06/22/2017   Procedure: TRANSESOPHAGEAL ECHOCARDIOGRAM (TEE);  Surgeon: Wellington Hampshire, MD;  Location: [facility];  Service: Cardiovascular;  Laterality: N/A;  . TOTAL KNEE ARTHROPLASTY Right 07/11/2018   Procedure: TOTAL KNEE ARTHROPLASTY;  Surgeon: Corky Mull, MD;  Location: [facility];  Service: Orthopedics;  Laterality: Right;  [name] VAGINAL HYSTERECTOMY    . VISCERAL ARTERY INTERVENTION N/A 06/20/2017   Procedure: Visceral Artery Intervention, possible aortic thrombectomy;  Surgeon: Algernon Huxley, MD;  Location: [place] [facility];  Service: Cardiovascular;  Laterality: N/A;  . VISCERAL ARTERY INTERVENTION N/A 01/28/2018   Procedure: VISCERAL ARTERY INTERVENTION;  Surgeon: Algernon Huxley, MD;  Location: [place] [facility];  Service: Cardiovascular;  Laterality: N/A;    Family History  Problem Relation Age of Onset  . Hypertension Mother   . Heart disease Mother   . Heart attack Mother   . Breast cancer Mother   . Hypertension Father   . Breast cancer Paternal Aunt     Social History:  reports that she has been smoking cigarettes. She has been smoking about 0.10 packs per day. She has never used smokeless tobacco. She reports that she does not drink alcohol and does not use drugs.  Allergies:  Allergies  Allergen Reactions  . Gabapentin     Tremors   . Bactrim [Sulfamethoxazole-Trimethoprim] Itching    Medications reviewed.    ROS Full ROS performed and is otherwise negative other than what is stated in HPI   BP 104/72   Pulse 94   Temp 98.5 F (36.9 C) (Oral)   Ht 5\' 3"  (1.6 m)   Wt 233 lb 3.2 oz (105.8 kg)   SpO2 98%   BMI 41.31 kg/m   Physical Exam Morbidly obese Abd: soft, Large ventral hernias with loss of domain and two superficial ulcerations.  There is no evidence of exposed bowel there is no  evidence of peritonitis.  This hernia is currently incarcerated Ext: cool extremities, no cyanosis    No results found for this or any previous visit (from the past 48 hour(s)). PERIPHERAL VASCULAR CATHETERIZATION  Result Date: 01/24/2021 See op note   Assessment/Plan: Recurrent ventral hernia with significant pain and now persistent cutaneous ulcerations.  This poses a very significant challenge in addition to that she does have active acute ischemia on her lower extremities.  Seems that the rest pain is extreme and she is getting angiogram today.  She is going to be obviously on antiplatelet therapy.  Very complicated situation with no good answers.  She is morbidly obese has significant peripheral vascular disease is actively smoking.  I do think that we are approaching a point that there is no other good options but to repair the hernia.  She understands that this has significant morbidity and even mortality.  At this time she does have acute ischemia of the  legs that need to be addressed.  Vascular is to repeat an angiogram today.  I will see her after a week or 2 and create a game plan.  She understands that doing an operation is very risky however not doing an operation I think it can be even more risk.  With potential peritonitis and if the ulcerations continue to progress may lead to potential evisceration. This is an extreme situation with no good answers.  I have also offered her to transfer to a tertiary center and currently she is not interested in that.   Greater than 50% of the 25 minutes  visit was spent in counseling/coordination of care   Caroleen Hamman, MD [place] Surgeon

## 2021-01-25 NOTE — Progress Notes (Addendum)
Right PAD and left PAD remain in place. Right PAD has small amount of oozing and old drainage present. Left PAD is clean, dry, intact. Both PAD's remain inflated @ 40. Pulses present; +1 in femoral, popliteal, posterior tibial; pulses dopplered on b/l dorsalis pedis. Both lower extremities remain warm to touch.   Pain management improved overnight, PRN oxy and morphine given x1 each. Scheduled Toradol given overnight. Pt sleeping adequately overnight.   Foley remains in place, put out 515 urine overnight. 500 ml LR bolus administered overnight.

## 2021-01-25 NOTE — Progress Notes (Signed)
Patient was stable overnight did not require any sedation we will sign off for now call us with question thank you for this consultation.

## 2021-01-25 NOTE — Progress Notes (Signed)
Foley removed by Caren Griffins, RN.

## 2021-01-25 NOTE — Discharge Summary (Addendum)
[facility]    Discharge Summary  Patient ID:  Carla [name] MRN: [id number] DOB/AGE: 11/19/1960 60 y.o.  Admit date: 01/24/2021 Discharge date: 01/27/2021 Date of Surgery: 01/24/2021 Surgeon: Surgeon(s): Algernon Huxley, MD  Admission Diagnosis: Ischemia of right lower extremity [I99.8]  Discharge Diagnoses:  Ischemia of right lower extremity [I99.8]  Secondary Diagnoses: Past Medical History:  Diagnosis Date  . Abdominal aortic atherosclerosis ([place])    a. 05/2017 CTA abd/pelvis: significant atherosclerotic dzs of the infrarenal abd Ao w/ some mural thrombus. No aneurysm or dissection.  . Abdominal pain   . Acute focal ischemia of small intestine (HCC)   . Acute right-sided low back pain with right-sided sciatica 06/13/2017  . AKI (acute kidney injury) ([place]) 07/29/2017  . Baker's cyst of knee, right    a. 07/2016 U/S: 4.1 x 1.4 x 2.9 cystic structure in R poplitetal fossa.  . Bell's palsy   . Borderline diabetes   . Cancer ([place])    skin cancer on nose  . Chest pain    a. 08/2012 Lexiscan MV: EF 54%, non ischemia/infarct.  . CHF (congestive heart failure) ([place])   . Chronic idiopathic constipation   . Chronic mesenteric ischemia ([place])   . COPD (chronic obstructive pulmonary disease) ([place])   . Diastolic dysfunction    a. 05/2017 Echo: Ef 60-65%, no rwma, Gr1 DD, no source of cardiac emboli.  . 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 05/2017 CTA Abd/pelvis: apparent thrombus or embolus in prox SMA (70-90%); b. 05/2017 catheter directed tPA, mechanical thrombectomy, and stenting of the SMA.  . Essential hypertension with goal blood pressure less than 140/90 01/19/2016  . Gastroesophageal reflux disease 01/19/2016  . GERD (gastroesophageal reflux disease)   . H/O colectomy   . History of kidney stones   . Hyperlipidemia   . Hypertension   . Hypotension 07/29/2017  . Morbid obesity with BMI of 40.0-44.9, adult ([place])   . Occlusive mesenteric ischemia  ([place]) 06/19/2017  . Personal history of other malignant neoplasm of skin 06/01/2016  . Pre-diabetes   . Primary osteoarthritis of both knees 06/13/2017  . Recurrent major depressive disorder ([place]) 01/19/2016  . SBO (small bowel obstruction) ([place]) 08/06/2017  . 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 Urinary tract infection 01/09/2014   Procedure(s): 01/24/21: 1. Ultrasound guidance for vascular access bilateral femoral arteries 2. Catheter placement into aorta from bilateral femoral approaches 3. Aortogram and bilateral iliofemoral angiogram 4.  Mechanical thrombectomy of the right external and common iliac arteries with penumbra CAT 7D device 5.  Balloon angioplasty of the left common and external iliac arteries with 7 mm diameter angioplasty balloon             6.  Viabahn stent placement to the right common iliac artery with 8 mm diameter by 7.5 cm length stent             7.  Mechanical thrombectomy of the left external and common iliac arteries with penumbra CAT 7D device             8.  Life star stent placement to the left distal common and proximal external iliac arteries with 8 mm diameter by 4 cm length stent 9. StarClose closure device bilateral femoral arteries  Discharged Condition: Good  HPI / Hospital Course:  Patient is a 60 y.o.female with recurrent rest pain of the right foot despite multiple previous interventions. The patient has noninvasive study showing reocclusion  of the right iliac stent. The patient is brought in for angiography for further evaluation and potential treatment.  Due to the limb threatening nature of the situation, angiogram was performed for attempted limb salvage. The patient is aware that if the procedure fails, amputation would be expected.  The patient also understands that even with successful revascularization, amputation may still be required due to the severity of the  situation. On 01/24/21, the patient underwent:  1. Ultrasound guidance for vascular access bilateral femoral arteries 2. Catheter placement into aorta from bilateral femoral approaches 3. Aortogram and bilateral iliofemoral angiogram 4.  Mechanical thrombectomy of the right external and common iliac arteries with penumbra CAT 7D device 5.  Balloon angioplasty of the left common and external iliac arteries with 7 mm diameter angioplasty balloon             6.  Viabahn stent placement to the right common iliac artery with 8 mm diameter by 7.5 cm length stent             7.  Mechanical thrombectomy of the left external and common iliac arteries with penumbra CAT 7D device             8.  Life star stent placement to the left distal common and proximal external iliac arteries with 8 mm diameter by 4 cm length stent 9. StarClose closure device bilateral femoral arteries  The patient tolerated the procedure well was transferred from the angiography suite to the ICU for observation overnight.  The patient's night of surgery was unremarkable.  Status post intervention she was treated with Aggrastat which was then transitioned to aspirin and Eliquis the following day.    During the patient's inpatient stay, she refused to get out of bed and ambulate until the afternoon of postop day #2.  At that point, PT/OT were concerned about the patient's lethargy and lack of motivation to ambulate.  IV morphine was discontinued.  Patient's pain was treated with p.o. narcotics and Toradol.  PT/OT recommended SNF for discharge however the patient adamantly refused this.  PT/OT also recommended EMS transport to her home which she also refused.  Multiple team members had long discussions with the patient including Dr. Lucky Cowboy, PT/OT and social work in regard to possible risks associated with discharge home versus discharge to a SNF.  Patient still refused  placement to SNF. Home services including PT/OT and visiting nurse were ordered.  The patient's diet was advanced, she did require an in and out cath however was urinating independently at discharge, her pain was controlled to the use of pain medication she was ambulating at baseline.  Day of discharge, the patient was afebrile with stable vital signs and improved physical exam.  Physical Exam:  Alert and oriented x3, no acute distress Cardiovascular: Regular rate and rhythm Pulmonary: Auscultation bilaterally Abdomen:  Ventral hernia noted on exam.  Gangrenous changes noted to the skin. GU: Foley removed Right lower extremity: Thigh soft.  Calf soft.  Extremities warm distally in toes.  Minimal edema.  Dopplerable pedal pulses.  There is no acute vascular compromise noted to the extremity at this time.  Motor/sensory is intact. Left lower extremity:  Thigh soft.  Calf soft.  Extremities warm distally in toes.  Minimal edema.  Dopplerable pedal pulses.  There is no acute vascular compromise noted to the extremity at this time.  Motor/sensory is intact.  Possibly palpable or faint PT pulse  Labs: As below  Complications: None  Consults: None  Significant Diagnostic Studies: CBC Lab Results  Component Value Date   WBC 6.7 01/27/2021   HGB 10.1 (L) 01/27/2021   HCT 31.4 (L) 01/27/2021   MCV 92.1 01/27/2021   PLT 203 01/27/2021   BMET    Component Value Date/Time   NA 141 01/27/2021 0315   NA 140 02/12/2015 1137   K 4.6 01/27/2021 0315   K 2.9 (L) 02/12/2015 1137   CL 106 01/27/2021 0315   CL 107 02/12/2015 1137   CO2 26 01/27/2021 0315   CO2 27 02/12/2015 1137   GLUCOSE 96 01/27/2021 0315   GLUCOSE 120 (H) 02/12/2015 1137   BUN 18 01/27/2021 0315   BUN 15 02/12/2015 1137   CREATININE 0.95 01/27/2021 0315   CREATININE 0.78 02/12/2015 1137   CALCIUM 8.2 (L) 01/27/2021 0315   CALCIUM 9.0 02/12/2015 1137   GFRNONAA >60 01/27/2021 0315   GFRNONAA >60 02/12/2015 1137    GFRAA >60 11/23/2019 1330   GFRAA >60 02/12/2015 1137   COAG Lab Results  Component Value Date   INR 1.1 11/23/2019   INR 1.0 11/07/2019   INR 1.04 07/08/2018   Disposition:  Discharge to :Home  Allergies as of 01/27/2021      Reactions   Gabapentin    Tremors    Bactrim [sulfamethoxazole-trimethoprim] Itching      Medication List    STOP taking these medications   torsemide 20 MG tablet Commonly known as: DEMADEX   traMADol 50 MG tablet Commonly known as: ULTRAM     TAKE these medications   albuterol 108 (90 Base) MCG/ACT inhaler Commonly known as: VENTOLIN HFA Inhale 2 puffs into the lungs every 6 (six) hours as needed for wheezing or shortness of breath.   amLODipine 5 MG tablet Commonly known as: NORVASC Take 1 tablet (5 mg total) by mouth daily.   apixaban 5 MG Tabs tablet Commonly known as: ELIQUIS Take 1 tablet (5 mg total) by mouth 2 (two) times daily.   aspirin EC 81 MG tablet Take 81 mg by mouth daily. Swallow whole.   atorvastatin 80 MG tablet Commonly known as: LIPITOR Take 80 mg by mouth at bedtime.   buPROPion 150 MG 24 hr tablet Commonly known as: WELLBUTRIN XL Take 150 mg by mouth daily.   citalopram 20 MG tablet Commonly known as: CELEXA Take 40 mg by mouth daily.   ferrous sulfate 325 (65 FE) MG tablet Take 325 mg by mouth daily.   furosemide 40 MG tablet Commonly known as: LASIX Take by mouth.   Ipratropium-Albuterol 20-100 MCG/ACT Aers respimat Commonly known as: COMBIVENT Inhale 1 puff into the lungs every 6 (six) hours as needed for wheezing or shortness of breath.   metoprolol tartrate 50 MG tablet Commonly known as: LOPRESSOR Take 1 tablet (50 mg total) by mouth 2 (two) times daily.   multivitamin with minerals Tabs tablet Take 1 tablet by mouth daily.   omeprazole 20 MG capsule Commonly known as: PRILOSEC Take 20 mg by mouth 2 (two) times daily before a meal.   oxyCODONE 5 MG immediate release tablet Commonly  known as: Oxy IR/ROXICODONE Take 1 tablet (5 mg total) by mouth every 6 (six) hours as needed for moderate pain or breakthrough pain.   pregabalin 200 MG capsule Commonly known as: Lyrica Take 1 capsule (200 mg total) by mouth 2 (two) times daily.            Durable Medical Equipment  (From admission, onward)  Start     Ordered   01/26/21 1806  For home use only DME Walker rolling  Once       Question Answer Comment  Walker: With [place] Wheels   Patient needs a walker to treat with the following condition PAD (peripheral artery disease) ([place])      01/26/21 1805   01/26/21 1805  For home use only DME 3 n 1  Once        01/26/21 1805         Verbal and written Discharge instructions given to the patient. Wound care per Discharge AVS  Follow-up Information    Dew, Erskine Squibb, MD Follow up in 1 month(s).   Specialties: Vascular Surgery, Radiology, Interventional Cardiology Why: Can see Dew or Arna Medici.  Will need ABI with visit. Contact information: [place] [address] [phone]              Signed: Sela Hua, PA-C  01/27/2021, 9:54 AM

## 2021-01-25 NOTE — Progress Notes (Signed)
[facility] Daily Progress Note  Subjective: Patient complaining of pain to the bilateral legs.  Objective: Vitals:   01/25/21 1300 01/25/21 1400 01/25/21 1500 01/25/21 1555  BP: 108/60 127/61    Pulse: 80 83 96 (!) 133  Resp: 15 16 14  (!) 26  Temp: 98.4 F (36.9 C)     TempSrc: Oral     SpO2: 97% 98% 98% 92%  Weight:      Height:        Intake/Output Summary (Last 24 hours) at 01/25/2021 1608 Last data filed at 01/25/2021 1200 Gross per 24 hour  Intake 353.67 ml  Output 715 ml  Net -361.33 ml   Physical Exam: Alert and oriented x3, no acute distress Cardiovascular: Regular rate and rhythm Pulmonary: Auscultation bilaterally Abdomen:             Ventral hernia noted on exam.  Gangrenous changes noted to the skin. GU: Foley removed Right lower extremity: Thigh soft.  Calf soft.  Extremities warm distally in toes.  Minimal edema.  Dopplerable pedal pulses.  There is no acute vascular compromise noted to the extremity at this time.  Motor/sensory is intact. Left lower extremity:  Thigh soft.  Calf soft.  Extremities warm distally in toes.  Minimal edema.  Dopplerable pedal pulses.  There is no acute vascular compromise noted to the extremity at this time.  Motor/sensory is intact.  Possibly palpable or faint PT pulse   Laboratory: CBC    Component Value Date/Time   WBC 6.4 01/25/2021 0345   HGB 10.8 (L) 01/25/2021 0345   HGB 13.4 02/12/2015 1137   HCT 33.8 (L) 01/25/2021 0345   HCT 40.9 02/12/2015 1137   PLT 217 01/25/2021 0345   PLT 272 02/12/2015 1137   BMET    Component Value Date/Time   NA 139 01/25/2021 0345   NA 140 02/12/2015 1137   K 4.1 01/25/2021 0345   K 2.9 (L) 02/12/2015 1137   CL 107 01/25/2021 0345   CL 107 02/12/2015 1137   CO2 25 01/25/2021 0345   CO2 27 02/12/2015 1137   GLUCOSE 132 (H) 01/25/2021 0345   GLUCOSE 120 (H) 02/12/2015 1137   BUN 13 01/25/2021 0345   BUN 15 02/12/2015 1137   CREATININE 0.97 01/25/2021 0345    CREATININE 0.78 02/12/2015 1137   CALCIUM 8.6 (L) 01/25/2021 0345   CALCIUM 9.0 02/12/2015 1137   GFRNONAA >60 01/25/2021 0345   GFRNONAA >60 02/12/2015 1137   GFRAA >60 11/23/2019 1330   GFRAA >60 02/12/2015 1137   Assessment/Planning: The patient is a 60 year old female with multiple medical issues including known history of peripheral artery disease currently not compliant on her anticoagulation who presents with a right lower extremity ischemia status post endovascular intervention postop day #1  1) there has been an improvement in the patient's physical exam.  The patient now has extremities warm distally to toes.  Dopplerable pedal pulses.  Palpable PT pulses. 2) from a vascular surgery standpoint, the patient was able to be discharged home however the patient refused to get out of bed with the exception of using the bathroom. 3) the patient did admit to the bedside nurse that she has not been taking her Eliquis or aspirin as she had originally stated. 4) the patient's physical exam is not notable for any type of ischemia, the patient is experiencing reperfusion pain.  This is very common after a recent ischemic presentation status post intervention revascularizing the bilateral legs.  5) unfortunately, since the patient is refusing to get out of bed and ambulate she will not be able to be discharged home. 6) we will plan for discharge home in the a.m. we will order PT/OT evaluation.  If the patient is not able to ambulate safely we will plan on discharge to SNF.  Discussed with Dr. Ellis Parents Chrishawn Kring PA-C 01/25/2021 4:08 PM

## 2021-01-25 NOTE — Progress Notes (Signed)
Spoke with [employer], PA via secure chat and made her aware that patient was sitting up in chair reclined and complained of feet feeling dead and painful and asked to go back to bed. RN tried to get patient to walk and patient began crying and upset stating how painful her feet are and hurt and stated she couldn't walk. Heart rate 140's. Assisted patient with ambulating back to bed and her heart rate went down after laying down. Stegmayer, Helper said she would cancel discharge and order PT for tomorrow.

## 2021-01-26 DIAGNOSIS — I743 Embolism and thrombosis of arteries of the lower extremities: Secondary | ICD-10-CM | POA: Diagnosis not present

## 2021-01-26 DIAGNOSIS — I70229 Atherosclerosis of native arteries of extremities with rest pain, unspecified extremity: Secondary | ICD-10-CM | POA: Diagnosis not present

## 2021-01-26 LAB — CBC WITH DIFFERENTIAL/PLATELET
Abs Immature Granulocytes: 0.02 10*3/uL (ref 0.00–0.07)
Basophils Absolute: 0 10*3/uL (ref 0.0–0.1)
Basophils Relative: 0 %
Eosinophils Absolute: 0.2 10*3/uL (ref 0.0–0.5)
Eosinophils Relative: 4 %
HCT: 31.4 % — ABNORMAL LOW (ref 36.0–46.0)
Hemoglobin: 10.5 g/dL — ABNORMAL LOW (ref 12.0–15.0)
Immature Granulocytes: 0 %
Lymphocytes Relative: 35 %
Lymphs Abs: 2.1 10*3/uL (ref 0.7–4.0)
MCH: 31 pg (ref 26.0–34.0)
MCHC: 33.4 g/dL (ref 30.0–36.0)
MCV: 92.6 fL (ref 80.0–100.0)
Monocytes Absolute: 0.5 10*3/uL (ref 0.1–1.0)
Monocytes Relative: 8 %
Neutro Abs: 3.2 10*3/uL (ref 1.7–7.7)
Neutrophils Relative %: 53 %
Platelets: 199 10*3/uL (ref 150–400)
RBC: 3.39 MIL/uL — ABNORMAL LOW (ref 3.87–5.11)
RDW: 15.3 % (ref 11.5–15.5)
WBC: 6 10*3/uL (ref 4.0–10.5)
nRBC: 0 % (ref 0.0–0.2)

## 2021-01-26 LAB — BASIC METABOLIC PANEL
Anion gap: 8 (ref 5–15)
BUN: 18 mg/dL (ref 8–23)
CO2: 25 mmol/L (ref 22–32)
Calcium: 8.4 mg/dL — ABNORMAL LOW (ref 8.9–10.3)
Chloride: 105 mmol/L (ref 98–111)
Creatinine, Ser: 0.93 mg/dL (ref 0.44–1.00)
GFR, Estimated: >60 mL/min (ref >60)
Glucose, Bld: 111 mg/dL — ABNORMAL HIGH (ref 70–99)
Potassium: 4.6 mmol/L (ref 3.5–5.1)
Sodium: 138 mmol/L (ref 135–145)

## 2021-01-26 LAB — MAGNESIUM: Magnesium: 2.3 mg/dL (ref 1.7–2.4)

## 2021-01-26 LAB — PHOSPHORUS: Phosphorus: 5.7 mg/dL — ABNORMAL HIGH (ref 2.5–4.6)

## 2021-01-26 LAB — HEPARIN INDUCED PLATELET AB (HIT ANTIBODY)
Heparin Induced Plt Ab: 0.08 OD (ref 0.000–0.400)
Heparin Induced Plt Ab: 0.07 OD (ref 0.000–0.400)

## 2021-01-26 MED ORDER — POLYETHYLENE GLYCOL 3350 17 G PO PACK
17.0000 g | PACK | Freq: Every day | ORAL | Status: DC
  Administered 2021-01-26 – 2021-01-27 (×2): 17 g via ORAL
  Filled 2021-01-26 (×2): qty 1

## 2021-01-26 MED ORDER — OXYCODONE HCL 5 MG PO TABS
5.0000 mg | ORAL_TABLET | Freq: Four times a day (QID) | ORAL | Status: DC | PRN
  Administered 2021-01-26: 10 mg via ORAL
  Administered 2021-01-27: 5 mg via ORAL
  Filled 2021-01-26: qty 1
  Filled 2021-01-26: qty 2

## 2021-01-26 MED ORDER — KETOROLAC TROMETHAMINE 30 MG/ML IJ SOLN
30.0000 mg | Freq: Four times a day (QID) | INTRAMUSCULAR | Status: AC
  Administered 2021-01-26 (×2): 30 mg via INTRAVENOUS
  Filled 2021-01-26 (×2): qty 1

## 2021-01-26 NOTE — Progress Notes (Signed)
[facility] Daily Progress Note Subjective: Patient complaining of pain to the bilateral legs.  Objective: Vitals:   01/26/21 0400 01/26/21 0500 01/26/21 0600 01/26/21 0800  BP: (!) 114/50 111/65 134/72 128/67  Pulse: 77 73 78 77  Resp: 16 16 20 15   Temp: 98 F (36.7 C)   97.9 F (36.6 C)  TempSrc: Oral   Oral  SpO2: 95% 94% 93% 94%  Weight:      Height:        Intake/Output Summary (Last 24 hours) at 01/26/2021 1804 Last data filed at 01/26/2021 1410 Gross per 24 hour  Intake 60 ml  Output 1300 ml  Net -1240 ml   Physical Exam: Alert and oriented x3, no acute distress Cardiovascular:Regular rate and rhythm Pulmonary:Auscultation bilaterally Abdomen: Ventral hernia noted on exam. Gangrenous changes noted to the skin. JM:EQAST removed Right lower extremity:Thigh soft. Calf soft. Extremities warm distally in toes. Minimal edema. Dopplerable pedal pulses. There is no acute vascular compromise noted to the extremity at this time. Motor/sensory is intact. Left lower extremity:Thigh soft. Calf soft. Extremities warm distally in toes. Minimal edema. Dopplerable pedal pulses. There is no acute vascular compromise noted to the extremity at this time. Motor/sensory is intact. Possibly palpable or faint PT pulse   Laboratory: CBC    Component Value Date/Time   WBC 6.0 01/26/2021 0403   HGB 10.5 (L) 01/26/2021 0403   HGB 13.4 02/12/2015 1137   HCT 31.4 (L) 01/26/2021 0403   HCT 40.9 02/12/2015 1137   PLT 199 01/26/2021 0403   PLT 272 02/12/2015 1137   BMET    Component Value Date/Time   NA 138 01/26/2021 0403   NA 140 02/12/2015 1137   K 4.6 01/26/2021 0403   K 2.9 (L) 02/12/2015 1137   CL 105 01/26/2021 0403   CL 107 02/12/2015 1137   CO2 25 01/26/2021 0403   CO2 27 02/12/2015 1137   GLUCOSE 111 (H) 01/26/2021 0403   GLUCOSE 120 (H) 02/12/2015 1137   BUN 18 01/26/2021 0403   BUN 15 02/12/2015 1137   CREATININE 0.93  01/26/2021 0403   CREATININE 0.78 02/12/2015 1137   CALCIUM 8.4 (L) 01/26/2021 0403   CALCIUM 9.0 02/12/2015 1137   GFRNONAA >60 01/26/2021 0403   GFRNONAA >60 02/12/2015 1137   GFRAA >60 11/23/2019 1330   GFRAA >60 02/12/2015 1137   Assessment/Planning: The patient is a 60 year old female with multiple medical issues including known history of peripheral artery disease currently not compliant on her anticoagulation who presents with a right lower extremity ischemia status post endovascular intervention postop day #2  1) there has been an improvement in the patient's physical exam. The patient now has extremities warm distally to toes.  Dopplerable pedal pulses.  Palpable PT pulses. 2)  the patient was evaluated by PT/OT.  Recommendations would be to SNF however the patient is adamant to be discharged home.  I reached out to social work to initiate home physical therapy, Occupational Therapy 3) patient was noted to be lethargic with PT/OT.  Patient refusing to get out of bed to ambulate.  Will stop IV morphine and treat the patient with p.o. pain and Toradol. 4) seen and examined with Dr. Lucky Cowboy.  Plan is to discharge to home in the a.m. when home services are set up.  Marcelle Overlie PA-C 01/26/2021 6:04 PM

## 2021-01-26 NOTE — Evaluation (Signed)
Occupational Therapy Evaluation Patient Details Name: Carla [name] MRN: [id number] DOB: [birth year] Today's Date: 01/26/2021    History of Present Illness Pt is a 60 year old female presenting to [facility] with ongoing complaints of right lower extremity pain. Pt is now s/p aortogram and bilateral iliofemoral angiogram, mechanical thrombectomy of the right external and common iliac arteries, stent placement to the right common iliac artery, balloon angioplasty of the left common and external iliac arteries, and mechanical thrombectomy of the left external and common iliac artery with stent placement to the left distal common and proximal external iliac arteries.  Post procedure the patient complained of severe right foot pain.  PMH includes: HTN, HLD, COPD,CHF, Bell's palsy, chronic mesenteric ischemia, abdominal aortic atherosclerosis, and L toe amputation.   Clinical Impression   Pt seen for OT evaluation this date in setting of acute hospitalization d/t RLE pain and now s/p revascularization. Pt reports being INDEP with self care and fxl mobility at baseline. States she has her spouse and sister assisting her as needed for IADLs. Pt presents this date with decreased R foot sensation post-operatively, R LE pain, and generally decreased tolerance and strength that are all impacting her ability to safely and efficiently perform self care ADLs/ADL mobility. Pt currently requiring MIN A with seated LB ADLs and is largely unable to contribute to standing LB ADLs d/t decreased standing balance requiring 2WW and unable to alternate UEs from hand supports. Pt requires MIN A +2 for ADL transfers and MOD A for bed mobility. Will continue to follow acutely. Anticipate that pt would have best outcomes if she were to d/c to STR, however, pt expressing adamantly to the therapy team that she wants to go home. Should she go home, she will require HHOT and below-listed equipment as well as family SUPV and assistance. Of note:  home has 5 STE and pt will likely require EMS assistance to enter the home should she defer to d/c to rehabilitation facility.    Follow Up Recommendations  SNF    Equipment Recommendations  3 in 1 bedside commode;Tub/shower seat;Other (comment) (2ww)    Recommendations for Other Services       Precautions / Restrictions Precautions Precautions: Fall Restrictions Weight Bearing Restrictions: No      Mobility Bed Mobility Overal bed mobility: Needs Assistance Bed Mobility: Supine to Sit;Sit to Supine     Supine to sit: Supervision Sit to supine: Mod assist;+2 for safety/equipment   General bed mobility comments: Extra time and effort wtih sup to sit and Mod A for BLE control during sit to sup, CGA to manage trunk/prevent injury    Transfers Overall transfer level: Needs assistance Equipment used: Rolling walker (2 wheeled) Transfers: Sit to/from Stand Sit to Stand: Min assist;+2 safety/equipment;From elevated surface         General transfer comment: Mod verbal cues for proper sequencing and hand placement with RW as well as cues to sequence wight shift    Balance Overall balance assessment: Needs assistance Sitting-balance support: Single extremity supported Sitting balance-[name]: Good     Standing balance support: Bilateral upper extremity supported;During functional activity Standing balance-[name]: Poor Standing balance comment: MIN A for static standing, MOD/MAX A for assist with weight shift (2p assist)                           ADL either performed or assessed with clinical judgement   ADL Overall ADL's : Needs assistance/impaired  General ADL Comments: requires increased time, MIN A For seated LB ADLs d/t R LE pain. Unable to balance well enough in standing wiht RW for UE support, to perform LB ADLs such as clothing mgt over hips. Requires MOD A for ADL transfers and MAX A to  assist with weight shift/stepping.     Vision Patient Visual Report: No change from baseline       Perception     Praxis      Pertinent Vitals/Pain Pain Assessment: 0-10 Pain Score: 7  Pain Location: RLE/R foot Pain Descriptors / Indicators: Sharp;Stabbing Pain Intervention(s): Limited activity within patient's tolerance;Monitored during session;Other (comment) (notified RN/MD)     Hand Dominance Left   Extremity/Trunk Assessment Upper Extremity Assessment Upper Extremity Assessment: Generalized weakness;RUE deficits/detail;LUE deficits/detail RUE Deficits / Details: increased weakness versus L UE. Grip MMT grossly 3+/5. Shld ROM only to half range LUE Deficits / Details: shld ROM to 1/3 range, general weakness, but better than R. Grip MMTgrossly 4-/5   Lower Extremity Assessment Lower Extremity Assessment: Defer to PT evaluation;Generalized weakness;RLE deficits/detail;LLE deficits/detail RLE Deficits / Details: Decreased sensation to light touch distal to the ankle; RLE strength testing limited by pain with hip flex <3/5, knee flex/ext 3+/5 RLE Sensation: decreased light touch LLE Deficits / Details: Knee flex/ext 4-/5, hip flex 3/5 LLE Sensation: WNL       Communication Communication Communication: No difficulties   Cognition Arousal/Alertness: Lethargic Behavior During Therapy: WFL for tasks assessed/performed Overall Cognitive Status: Within Functional Limits for tasks assessed                                 General Comments: pt initially somewhat groggy, but she is oriented and able to follow commands.   General Comments       Exercises Total Joint Exercises Ankle Circles/Pumps: AROM;Strengthening;Both;10 reps Quad Sets: 10 reps;Both;Strengthening Gluteal Sets: Strengthening;Both;10 reps Hip ABduction/ADduction: AAROM;Both;10 reps;AROM Straight Leg Raises: AROM;AAROM;Both;10 reps Long Arc Quad: Strengthening;Both;5 reps Knee Flexion: Both;5  reps;Strengthening Marching in Standing: AROM;Strengthening;Both;Other reps (comment) (x 2-3 reps) Other Exercises Other Exercises: HEP ed re: modified tricep push, modified bed level bicep pulls (w/in tolerance for her shoulders), and contralateral reaching for core control (w/in her tolerance given hernia). Pt with good understanding. Also educated re: safety concerns and fall prevention   Shoulder Instructions      Home Living Family/patient expects to be discharged to:: Private residence Living Arrangements: Spouse/significant other Available Help at Discharge: Family;Available 24 hours/day Type of Home: Mobile home Home Access: Stairs to enter Entrance Stairs-Number of Steps: 5 Entrance Stairs-Rails: Left Home Layout: One level     Bathroom Shower/Tub: Teacher, early years/pre: Standard     Home Equipment: Clinical cytogeneticist - standard;Wheelchair - manual          Prior Functioning/Environment Level of Independence: Needs assistance  Gait / Transfers Assistance Needed: Ind with Amb household distances without an AD, w/c for community access, one fall in the last 6 months secondary to legs buckling ADL's / Homemaking Assistance Needed: Daughter assists with chores around the home and errands/driving, pt Ind with bathing/dressing            OT Problem List: Decreased strength;Impaired balance (sitting and/or standing);Decreased knowledge of precautions;Impaired sensation;Decreased range of motion;Decreased activity tolerance;Decreased knowledge of use of DME or AE;Decreased safety awareness;Obesity;Pain      OT Treatment/Interventions: Self-care/ADL training;DME and/or AE instruction;Therapeutic activities;Balance training;Therapeutic exercise;Energy conservation;Patient/family education  OT Goals(Current goals can be found in the care plan section) Acute Rehab OT Goals Patient Stated Goal: To walk better OT Goal Formulation: With patient Time For Goal  Achievement: 02/09/21 Potential to Achieve Goals: Good ADL Goals Pt Will Perform Grooming: with min guard assist;standing (with RW, tolerating ~4 minute stand, alternating hands from RW support to perform 1-2 g/h tasks.) Pt Will Perform Lower Body Dressing: with min assist;sit to/from stand (with LRAD/AE PRN) Pt Will Transfer to Toilet: with min assist;stand pivot transfer;bedside commode Pt Will Perform Toileting - Clothing Manipulation and hygiene: with min assist;sit to/from stand (with use of toilet rails and LRAD) Pt/caregiver will Perform Home Exercise Program: Increased strength;Both right and left upper extremity;With Supervision  OT Frequency: Min 2X/week   Barriers to D/C: Inaccessible home environment  5 STE the home and pt currently barely able to weight shift with RW and assist x2 to take regular steps       Co-evaluation PT/OT/SLP Co-Evaluation/Treatment: Yes Reason for Co-Treatment: For patient/therapist safety PT goals addressed during session: Mobility/safety with mobility;Proper use of DME;Strengthening/ROM OT goals addressed during session: ADL's and self-care;Strengthening/ROM      AM-PAC OT "6 Clicks" Daily Activity     Outcome Measure Help from another person eating meals?: None Help from another person taking care of personal grooming?: A Little Help from another person toileting, which includes using toliet, bedpan, or urinal?: A Lot Help from another person bathing (including washing, rinsing, drying)?: A Little Help from another person to put on and taking off regular upper body clothing?: None Help from another person to put on and taking off regular lower body clothing?: A Lot 6 Click Score: 18   End of Session Equipment Utilized During Treatment: Gait belt;Rolling walker Nurse Communication: Mobility status;Other (comment) (notified that pt groggy and very limited sensation to R foot)  Activity Tolerance: Patient limited by fatigue;Patient limited by  pain Patient left: in bed;with call bell/phone within reach;with bed alarm set  OT Visit Diagnosis: Unsteadiness on feet (R26.81);Muscle weakness (generalized) (M62.81);Pain Pain - Right/Left: Right Pain - part of body: Leg                Time: 1453-1550 OT Time Calculation (min): 57 min Charges:  OT General Charges $OT Visit: 1 Visit OT Evaluation $OT Eval Moderate Complexity: 1 Mod OT Treatments $Self Care/Home Management : 8-22 mins $Therapeutic Exercise: 8-22 mins  Gerrianne Scale, MS, OTR/L ascom [phone] 01/26/21, 5:26 PM

## 2021-01-26 NOTE — Evaluation (Signed)
Physical Therapy Evaluation Patient Details Name: Carla [name] MRN: [id number] DOB: [birth year] Today's Date: 01/26/2021   History of Present Illness  Pt is a 60 year old female presenting to [facility] with ongoing complaints of right lower extremity pain. Pt is now s/p aortogram and bilateral iliofemoral angiogram, mechanical thrombectomy of the right external and common iliac arteries, stent placement to the right common iliac artery, balloon angioplasty of the left common and external iliac arteries, and mechanical thrombectomy of the left external and common iliac artery with stent placement to the left distal common and proximal external iliac arteries.  Post procedure the patient complained of severe right foot pain.  PMH includes: HTN, HLD, COPD,CHF, Bell's palsy, chronic mesenteric ischemia, abdominal aortic atherosclerosis, and L toe amputation.    Clinical Impression  Pt was somewhat lethargic with a flat affect during the session but put forth fair effort and followed commands well.  Ultimately pt was very limited functionally and required extensive time and effort as well as physical assistance with most functional tasks.  Pt was only able to take several very small, effortful steps at the EOB before needing to return to sitting secondary to fatigue and RLE pain.  Despite pt's substantial functional limitations the pt was resistant to the idea of going to a SNF upon discharge and seemed to have very poor ability to correlate her limitations during the session with limitations she would have getting home such as difficulties getting in/out of a car and up her five steps to enter her home.  If pt does decline SNF recommendation then she would be at a very high risk for falls at home and would benefit from non emergent EMS transport.  Pt will benefit from PT services in a SNF setting upon discharge to safely address deficits listed in patient problem list for decreased caregiver assistance and eventual  return to PLOF.     Follow Up Recommendations SNF;Supervision for mobility/OOB    Equipment Recommendations  3in1 (PT);Rolling walker with 5" wheels    Recommendations for Other Services       Precautions / Restrictions Precautions Precautions: Fall Restrictions Weight Bearing Restrictions: No      Mobility  Bed Mobility Overal bed mobility: Needs Assistance Bed Mobility: Supine to Sit;Sit to Supine     Supine to sit: Supervision Sit to supine: Mod assist   General bed mobility comments: Extra time and effort wtih sup to sit and Mod A for BLE control during sit to sup    Transfers Overall transfer level: Needs assistance Equipment used: Rolling walker (2 wheeled) Transfers: Sit to/from Stand Sit to Stand: Min assist;+2 safety/equipment;From elevated surface         General transfer comment: Mod verbal cues for proper sequencing  Ambulation/Gait Ambulation/Gait assistance: Min assist;+2 safety/equipment Gait Distance (Feet): 3 Feet Assistive device: Rolling walker (2 wheeled) Gait Pattern/deviations: Step-to pattern;Trunk flexed;Antalgic;Decreased stance time - right;Decreased step length - left Gait velocity: decreased   General Gait Details: Pt only able to take a maximum of 4-5 very small, effortful steps at the EOB before needing to return to sitting secondary to RLE pain and general weakness  Stairs            Wheelchair Mobility    Modified Rankin (Stroke Patients Only)       Balance Overall balance assessment: Needs assistance Sitting-balance support: Single extremity supported Sitting balance-[name]: Good     Standing balance support: Bilateral upper extremity supported;During functional activity Standing balance-[name]: Poor Standing  balance comment: Min A for stability during standing tasks                             Pertinent Vitals/Pain Pain Assessment: 0-10 Pain Score: 7  Pain Location: RLE/R foot Pain  Descriptors / Indicators: Sharp Pain Intervention(s): Premedicated before session;Monitored during session    [place] expects to be discharged to:: Private residence Living Arrangements: Spouse/significant other Available Help at Discharge: Family;Available 24 hours/day Type of Home: Mobile home Home Access: Stairs to enter Entrance Stairs-Rails: Left Entrance Stairs-Number of Steps: 5 Home Layout: One level Home Equipment: Shower seat;Walker - standard;Wheelchair - manual      Prior Function Level of Independence: Needs assistance   Gait / Transfers Assistance Needed: Ind with Amb household distances without an AD, w/c for community access, one fall in the last 6 months secondary to legs buckling  ADL's / Homemaking Assistance Needed: Daughter assists with chores around the home and errands/driving, pt Ind with bathing/dressing        Hand Dominance   Dominant Hand: Left    Extremity/Trunk Assessment   Upper Extremity Assessment Upper Extremity Assessment: Defer to OT evaluation    Lower Extremity Assessment Lower Extremity Assessment: Generalized weakness;RLE deficits/detail;LLE deficits/detail RLE Deficits / Details: Decreased sensation to light touch distal to the ankle; RLE strength testing limited by pain with hip flex <3/5, knee flex/ext 3+/5 RLE Sensation: decreased light touch LLE Deficits / Details: Knee flex/ext 4-/5, hip flex 3/5 LLE Sensation: WNL       Communication   Communication: No difficulties  Cognition Arousal/Alertness: Lethargic Behavior During Therapy: WFL for tasks assessed/performed Overall Cognitive Status: Within Functional Limits for tasks assessed                                        General Comments      Exercises Total Joint Exercises Ankle Circles/Pumps: AROM;Strengthening;Both;10 reps Quad Sets: 10 reps;Both;Strengthening Gluteal Sets: Strengthening;Both;10 reps Hip ABduction/ADduction:  AAROM;Both;10 reps;AROM Straight Leg Raises: AROM;AAROM;Both;10 reps Long Arc Quad: Strengthening;Both;5 reps Knee Flexion: Both;5 reps;Strengthening Marching in Standing: AROM;Strengthening;Both;Other reps (comment) (x 2-3 reps) Other Exercises Other Exercises: HEP education for BLE APs, QS, and GS x 10 each every 1-2 hours daily   Assessment/Plan    PT Assessment Patient needs continued PT services  PT Problem List Decreased strength;Decreased activity tolerance;Decreased balance;Decreased mobility;Decreased knowledge of use of DME;Decreased safety awareness;Impaired sensation;Pain       PT Treatment Interventions DME instruction;Gait training;Stair training;Functional mobility training;Therapeutic activities;Therapeutic exercise;Balance training;Patient/family education    PT Goals (Current goals can be found in the Care Plan section)  Acute Rehab PT Goals Patient Stated Goal: To walk better PT Goal Formulation: With patient Time For Goal Achievement: 02/08/21 Potential to Achieve Goals: Fair    Frequency Min 2X/week   Barriers to discharge Inaccessible home environment      Co-evaluation PT/OT/SLP Co-Evaluation/Treatment: Yes Reason for Co-Treatment: For patient/therapist safety PT goals addressed during session: Mobility/safety with mobility;Proper use of DME;Strengthening/ROM OT goals addressed during session: ADL's and self-care;Strengthening/ROM       AM-PAC PT "6 Clicks" Mobility  Outcome Measure Help needed turning from your back to your side while in a flat bed without using bedrails?: A Little Help needed moving from lying on your back to sitting on the side of a flat bed without using bedrails?: A  Little Help needed moving to and from a bed to a chair (including a wheelchair)?: A Lot Help needed standing up from a chair using your arms (e.g., wheelchair or bedside chair)?: A Lot Help needed to walk in hospital room?: Total Help needed climbing 3-5 steps with  a railing? : Total 6 Click Score: 12    End of Session Equipment Utilized During Treatment: Gait belt Activity Tolerance: Patient limited by pain Patient left: in bed;Other (comment) (Pt left with OT to finish OT evaluation and treatment) Nurse Communication: Mobility status PT Visit Diagnosis: Unsteadiness on feet (R26.81);History of falling (Z91.81);Muscle weakness (generalized) (M62.81);Difficulty in walking, not elsewhere classified (R26.2);Pain Pain - Right/Left: Right Pain - part of body: Leg;Ankle and joints of foot    Time: 1450-1529 PT Time Calculation (min) (ACUTE ONLY): 39 min   Charges:   PT Evaluation $PT Eval Moderate Complexity: 1 Mod PT Treatments $Therapeutic Exercise: 8-22 mins        D. Royetta Asal PT, DPT 01/26/21, 4:11 PM

## 2021-01-26 NOTE — Plan of Care (Signed)
  Problem: Education: Goal: Knowledge of General Education information will improve Description: Including pain rating scale, medication(s)/side effects and non-pharmacologic comfort measures Outcome: Progressing   Problem: Clinical Measurements: Goal: Ability to maintain clinical measurements within normal limits will improve Outcome: Progressing Goal: Will remain free from infection Outcome: Progressing Goal: Diagnostic test results will improve Outcome: Progressing Goal: Cardiovascular complication will be avoided Outcome: Progressing   Problem: Nutrition: Goal: Adequate nutrition will be maintained Outcome: Progressing   Problem: Coping: Goal: Level of anxiety will decrease Outcome: Progressing   Problem: Elimination: Goal: Will not experience complications related to urinary retention Outcome: Progressing   Problem: Pain Managment: Goal: General experience of comfort will improve Outcome: Progressing   Problem: Safety: Goal: Ability to remain free from injury will improve Outcome: Progressing   Problem: Skin Integrity: Goal: Risk for impaired skin integrity will decrease Outcome: Progressing

## 2021-01-27 DIAGNOSIS — I743 Embolism and thrombosis of arteries of the lower extremities: Secondary | ICD-10-CM | POA: Diagnosis not present

## 2021-01-27 DIAGNOSIS — I70229 Atherosclerosis of native arteries of extremities with rest pain, unspecified extremity: Secondary | ICD-10-CM | POA: Diagnosis not present

## 2021-01-27 LAB — BASIC METABOLIC PANEL
Anion gap: 9 (ref 5–15)
BUN: 18 mg/dL (ref 8–23)
CO2: 26 mmol/L (ref 22–32)
Calcium: 8.2 mg/dL — ABNORMAL LOW (ref 8.9–10.3)
Chloride: 106 mmol/L (ref 98–111)
Creatinine, Ser: 0.95 mg/dL (ref 0.44–1.00)
GFR, Estimated: >60 mL/min (ref >60)
Glucose, Bld: 96 mg/dL (ref 70–99)
Potassium: 4.6 mmol/L (ref 3.5–5.1)
Sodium: 141 mmol/L (ref 135–145)

## 2021-01-27 LAB — CBC
HCT: 31.4 % — ABNORMAL LOW (ref 36.0–46.0)
Hemoglobin: 10.1 g/dL — ABNORMAL LOW (ref 12.0–15.0)
MCH: 29.6 pg (ref 26.0–34.0)
MCHC: 32.2 g/dL (ref 30.0–36.0)
MCV: 92.1 fL (ref 80.0–100.0)
Platelets: 203 10*3/uL (ref 150–400)
RBC: 3.41 MIL/uL — ABNORMAL LOW (ref 3.87–5.11)
RDW: 15.1 % (ref 11.5–15.5)
WBC: 6.7 10*3/uL (ref 4.0–10.5)
nRBC: 0 % (ref 0.0–0.2)

## 2021-01-27 LAB — MAGNESIUM: Magnesium: 2.2 mg/dL (ref 1.7–2.4)

## 2021-01-27 LAB — GLUCOSE, CAPILLARY: Glucose-Capillary: 96 mg/dL (ref 70–99)

## 2021-01-27 LAB — PHOSPHORUS: Phosphorus: 5.3 mg/dL — ABNORMAL HIGH (ref 2.5–4.6)

## 2021-01-27 NOTE — Progress Notes (Signed)
Pt admitted to [facility] via bed with all her belongings accompanied by ICU RN. Pt on RA. No signs of distress noted.

## 2021-01-27 NOTE — TOC Initial Note (Signed)
Transition of Care [facility]) - Initial/Assessment Note    Patient Details  Name: Carla [name] MRN: [id number] Date of Birth: Dec 04, 1960  Transition of Care [facility]) CM/SW Contact:    Beverly Sessions, RN Phone Number: 01/27/2021, 10:39 AM  Clinical Narrative:                 Patient admitted from home with ischemia of RLE.  Patient states that she lives at home with her boyfriend.  PCP Ouida Sills Denies issues with transportation or obtaining medications  Patient has a standard walker, WC, and shower seat  PT and Ot have seen patient Both have recommended SNF.  Discussed in detail with patient.  She declines SNF but is agreeable to Home health.  States she does not have a preference of home health agency.  Checked with local agencies to see who has availability for patients payor, and staffing to accommodate patients needs.  Alvis Lemmings able to accept patient.  Patient in agreement.  Referral made to [facility] with Alvis Lemmings   PT and PA in agreement that patient needs EMS transport home due to her limited mobility and 5 stairs to enter the home.  Patient adamant that she does not want EMS transport.  She states that her boyfriend, sister, and nephew will pick her up and get her in the home.  Bedside RN and PA notified  Ronda with [employer] to deliver RW and [facility] prior to discharge    Expected Discharge Plan: [place] Barriers to Discharge: No Barriers Identified   Patient Goals and CMS Choice     Choice offered to / list presented to : Patient  Expected Discharge Plan and Services Expected Discharge Plan: [place]   Discharge Planning Services: CM Consult Post Acute Care Choice: [place] arrangements for the past 2 months: Single Family Home Expected Discharge Date: 01/27/21               DME Arranged: [place]ford Rile rolling,3-N-1 DME Agency: [employer] Date DME Agency Contacted: 01/27/21   Representative spoke with at DME Agency: ronda [place]:  PT,RN,Refused SNF [place] Agency: [place] Date [place]: 01/27/21   Representative spoke with at [place]: Sarah  Prior Living Arrangements/Services Living arrangements for the past 2 months: [place] with:: Significant Other,Relatives Patient language and need for interpreter reviewed:: Yes Do you feel safe going back to the place where you live?: Yes      Need for Family Participation in Patient Care: Yes (Comment) Care giver support system in place?: Yes (comment) Current home services: DME Criminal Activity/Legal Involvement Pertinent to Current Situation/Hospitalization: No - Comment as needed  Activities of Daily Living Home Assistive Devices/Equipment: None ADL Screening (condition at time of admission) Patient's cognitive ability adequate to safely complete daily activities?: Yes Is the patient deaf or have difficulty hearing?: No Does the patient have difficulty seeing, even when wearing glasses/contacts?: No Does the patient have difficulty concentrating, remembering, or making decisions?: No Patient able to express need for assistance with ADLs?: No Does the patient have difficulty dressing or bathing?: No Independently performs ADLs?: Yes (appropriate for developmental age) Does the patient have difficulty walking or climbing stairs?: Yes Weakness of Legs: Both  Permission Sought/Granted                  Emotional Assessment Appearance:: Appears stated age Attitude/Demeanor/Rapport: Engaged Affect (typically observed): Accepting Orientation: : Oriented to Self,Oriented to Place,Oriented to  Time,Oriented to Situation Alcohol / Substance Use: Not Applicable Psych Involvement: No (comment)  Admission diagnosis:  Ischemia of right lower extremity [I99.8] Patient Active Problem List   Diagnosis Date Noted  . Ischemia of right lower extremity 01/24/2021  . Tobacco abuse counseling 10/26/2020  . Smoker 10/26/2020  . Chronic  diastolic CHF (congestive heart failure) ([place]) 10/06/2020  . Neuropathic pain 08/19/2020  . Bursitis of right shoulder 12/01/2019  . Chronic thumb pain, right 12/01/2019  . Primary osteoarthritis involving multiple joints 12/01/2019  . Ischemia of left lower extremity 11/17/2019  . Carpal tunnel syndrome, right 11/10/2019  . Health care maintenance 03/26/2019  . Disorder of skeletal system 03/26/2019  . Problems influencing health status 03/26/2019  . Chronic low back pain (Secondary area of Pain) (Bilateral) w/o sciatica 03/26/2019  . Rib pain on left side 03/25/2019  . Aortic atherosclerosis ([place]) 09/12/2018  . COPD (chronic obstructive pulmonary disease) ([place]) 09/12/2018  . Moderate episode of recurrent major depressive disorder ([place]) 09/12/2018  . Lumbar spondylosis 09/06/2018  . History of total knee replacement (Right) 07/11/2018  . Ventral hernia with obstruction and without gangrene 07/02/2018  . Paroxysmal atrial fibrillation ([place]) 06/15/2018  . Accelerated hypertension 04/08/2018  . Acute encephalopathy 04/08/2018  . DDD (degenerative disc disease), lumbosacral 09/25/2017  . Osteoarthritis of knee 09/25/2017  . Adjustment disorder with anxiety 08/15/2017  . Chronic idiopathic constipation   . Protein-calorie malnutrition, severe 08/08/2017  . SBO (small bowel obstruction) ([place]) 08/06/2017  . Chronic mesenteric ischemia ([place])   . AKI (acute kidney injury) ([place]) 07/29/2017  . Hypotension 07/29/2017  . 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[place])   . Abdominal pain   . Occlusive mesenteric ischemia ([place]) 06/19/2017  . 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 Acute focal ischemia of small intestine (HCC)   . Acute right-sided low back pain with right-sided sciatica 06/13/2017  . Osteoarthritis of knee (Bilateral) 06/13/2017  . Chronic pain syndrome 09/13/2016  . Morbid obesity with BMI of 40.0-44.9, adult ([employer]) 07/16/2016  . Personal history of other malignant  neoplasm of skin 06/01/2016  . Chronic knee pain (Primary Area of Pain) (Bilateral) 01/19/2016  . Essential hypertension with goal blood pressure less than 140/90 01/19/2016  . Gastroesophageal reflux disease 01/19/2016  . Hyperlipidemia 01/19/2016  . Prediabetes 01/19/2016  . Recurrent major depressive disorder ([place]) 01/19/2016  . Urinary tract infection 01/09/2014  . Chronic pelvic pain in female 11/14/2013  . Mixed stress and urge urinary incontinence 11/14/2013  . Chest pain 08/06/2012   PCP:  Kirk Ruths, MD Pharmacy:   [facility] [address] ([address]), [place] [address] [place])  [address] Phone: [phone] Fax: [phone]  [id number] [place], [place] [address] [place] [address] Phone: [phone] Fax: [phone]     Social Determinants of Health (SDOH) Interventions    Readmission Risk Interventions No flowsheet data found.

## 2021-01-27 NOTE — Progress Notes (Signed)
Carla [name] to be D/C'd home per MD order.  Discussed prescriptions and follow up appointments with the patient. Prescriptions given to patient, medication list explained in detail. Pt verbalized understanding.  Allergies as of 01/27/2021       Reactions   Gabapentin    Tremors    Bactrim [sulfamethoxazole-trimethoprim] Itching        Medication List     STOP taking these medications    torsemide 20 MG tablet Commonly known as: DEMADEX   traMADol 50 MG tablet Commonly known as: ULTRAM       TAKE these medications    albuterol 108 (90 Base) MCG/ACT inhaler Commonly known as: VENTOLIN HFA Inhale 2 puffs into the lungs every 6 (six) hours as needed for wheezing or shortness of breath.   amLODipine 5 MG tablet Commonly known as: NORVASC Take 1 tablet (5 mg total) by mouth daily.   apixaban 5 MG Tabs tablet Commonly known as: ELIQUIS Take 1 tablet (5 mg total) by mouth 2 (two) times daily.   aspirin EC 81 MG tablet Take 81 mg by mouth daily. Swallow whole.   atorvastatin 80 MG tablet Commonly known as: LIPITOR Take 80 mg by mouth at bedtime.   buPROPion 150 MG 24 hr tablet Commonly known as: WELLBUTRIN XL Take 150 mg by mouth daily.   citalopram 20 MG tablet Commonly known as: CELEXA Take 40 mg by mouth daily.   ferrous sulfate 325 (65 FE) MG tablet Take 325 mg by mouth daily.   furosemide 40 MG tablet Commonly known as: LASIX Take by mouth.   Ipratropium-Albuterol 20-100 MCG/ACT Aers respimat Commonly known as: COMBIVENT Inhale 1 puff into the lungs every 6 (six) hours as needed for wheezing or shortness of breath.   metoprolol tartrate 50 MG tablet Commonly known as: LOPRESSOR Take 1 tablet (50 mg total) by mouth 2 (two) times daily.   multivitamin with minerals Tabs tablet Take 1 tablet by mouth daily.   omeprazole 20 MG capsule Commonly known as: PRILOSEC Take 20 mg by mouth 2 (two) times daily before a meal.   oxyCODONE 5 MG immediate  release tablet Commonly known as: Oxy IR/ROXICODONE Take 1 tablet (5 mg total) by mouth every 6 (six) hours as needed for moderate pain or breakthrough pain.   pregabalin 200 MG capsule Commonly known as: Lyrica Take 1 capsule (200 mg total) by mouth 2 (two) times daily.               Durable Medical Equipment  (From admission, onward)           Start     Ordered   01/26/21 1806  For home use only DME Walker rolling  Once       Question Answer Comment  Walker: With 5 Inch Wheels   Patient needs a walker to treat with the following condition PAD (peripheral artery disease) ([place])      01/26/21 1805   01/26/21 1805  For home use only DME 3 n 1  Once        01/26/21 1805            Vitals:   01/27/21 0735 01/27/21 0916  BP: (!) 106/52 (!) 114/54  Pulse: 63 69  Resp: 16   Temp: 97.8 F (36.6 C)   SpO2: 95%     Skin clean, dry and intact without evidence of skin break down, no evidence of skin tears noted. IV catheter discontinued intact. Site without signs  and symptoms of complications. Dressing and pressure applied. Pt denies pain at this time. No complaints noted.  An After Visit Summary was printed and given to the patient. Patient escorted via [place], and D/C home via private auto.  [name]

## 2021-02-01 ENCOUNTER — Telehealth (INDEPENDENT_AMBULATORY_CARE_PROVIDER_SITE_OTHER): Payer: Self-pay | Admitting: Vascular Surgery

## 2021-02-01 ENCOUNTER — Encounter (INDEPENDENT_AMBULATORY_CARE_PROVIDER_SITE_OTHER): Payer: Medicare Other

## 2021-02-01 ENCOUNTER — Ambulatory Visit (INDEPENDENT_AMBULATORY_CARE_PROVIDER_SITE_OTHER): Payer: Medicare Other | Admitting: Nurse Practitioner

## 2021-02-01 NOTE — Telephone Encounter (Signed)
Requesting medication has been called into pharmacy and patient has been made aware

## 2021-02-01 NOTE — Telephone Encounter (Signed)
Patient originally had an appointment on 4/12 but needed to be cancelled because patient was hospitalized and appointment pushed to May 6 for f/u lower extre. angio.  Patient is requesting more Tramadol medication stating she is still in pain.  Please advise.

## 2021-02-02 ENCOUNTER — Other Ambulatory Visit: Payer: Self-pay

## 2021-02-02 ENCOUNTER — Ambulatory Visit
Payer: Medicare Other | Attending: Student in an Organized Health Care Education/Training Program | Admitting: Student in an Organized Health Care Education/Training Program

## 2021-02-02 ENCOUNTER — Encounter: Payer: Self-pay | Admitting: Student in an Organized Health Care Education/Training Program

## 2021-02-02 VITALS — BP 94/59 | HR 62 | Temp 96.8°F | Resp 16 | Ht 62.0 in | Wt 235.0 lb

## 2021-02-02 DIAGNOSIS — M170 Bilateral primary osteoarthritis of knee: Secondary | ICD-10-CM | POA: Insufficient documentation

## 2021-02-02 DIAGNOSIS — M25562 Pain in left knee: Secondary | ICD-10-CM | POA: Diagnosis present

## 2021-02-02 DIAGNOSIS — M545 Low back pain, unspecified: Secondary | ICD-10-CM | POA: Diagnosis present

## 2021-02-02 DIAGNOSIS — G894 Chronic pain syndrome: Secondary | ICD-10-CM | POA: Diagnosis not present

## 2021-02-02 DIAGNOSIS — M25561 Pain in right knee: Secondary | ICD-10-CM | POA: Diagnosis not present

## 2021-02-02 DIAGNOSIS — G8929 Other chronic pain: Secondary | ICD-10-CM | POA: Insufficient documentation

## 2021-02-02 MED ORDER — ALPHA-LIPOIC ACID 600 MG PO CAPS: 600.0000 mg | ORAL_CAPSULE | Freq: Every day | ORAL | 0 refills | Status: AC

## 2021-02-02 NOTE — Progress Notes (Signed)
PROVIDER NOTE: Information contained herein reflects review and annotations entered in association with encounter. Interpretation of such information and data should be left to medically-trained personnel. Information provided to patient can be located elsewhere in the medical record under "Patient Instructions". Document created using STT-dictation technology, any transcriptional errors that may result from process are unintentional.    Patient: Audry Pili  Service Category: E/M  Provider: Gillis Santa, MD  DOB: Mar 08, 1961  DOS: 02/02/2021  Specialty: Interventional Pain Management  MRN: [id number]  Setting: Ambulatory outpatient  PCP: Kirk Ruths, MD  Type: Established Patient    Referring Provider: Kirk Ruths, MD  Location: Office  Delivery: Face-to-face     HPI  Ms. JAE BRUCK, a 60 y.o. year old female, is here today because of her Chronic pain syndrome [G89.4]. Ms. Dunnigan primary complain today is Foot Pain (bilateral) Last encounter: My last encounter with her was on 01/13/2021. Pertinent problems: Ms. Kulinski has Chronic knee pain (Primary Area of Pain) (Bilateral); Chronic pain syndrome; Osteoarthritis of knee (Bilateral); DDD (degenerative disc disease), lumbosacral; and Lumbar spondylosis on their pertinent problem list. Pain Assessment: Severity of Chronic pain is reported as a 8 /10. Location: Foot Right,Left/ . Onset: More than a month ago. Quality: Pins and needles,Burning,Tingling,Numbness. Timing: Constant. Modifying factor(s): rest, elevation. Vitals:  height is $RemoveB'5\' 2"'QYbcfrTa$  (1.575 m) and weight is 235 lb (106.6 kg). Her temporal temperature is 96.8 F (36 C) (abnormal). Her blood pressure is 94/59 (abnormal) and her pulse is 62. Her respiration is 16 and oxygen saturation is 97%.   Reason for encounter: 2nd patient visit    See HPI/ A&P below from initial clinic visit:  Jalacia is a 60 year old female who presents with a chief complaint of low back pain related to  lumbar degenerative disc disease and lumbar facet arthropathy as well as bilateral lower extremity pain related to lower extremity neuropathy.  She has been evaluated by orthopedics, sports medicine as well as neurology in the past.  She has tried gabapentin without any significant benefit.  She is on Lyrica 100 mg 3 times a day.  She is also on amitriptyline 25 mg daily.  She states that she has tried injections in her lumbar spine in the past which were not effective and made her pain worse.  She has not tried physical therapy.  She is morbidly obese.  She continues to smoke daily.  She is not interested in proceeding with physical therapy.  She is on Cymbalta 60 mg daily as well.  A&P Vianny presents today with persistent and severe lower extremity neuropathic pain that has been refractory to various neuropathic's including amitriptyline, gabapentin, Lyrica.  She is currently on Lyrica 100 mg 3 times a day which is a fairly high dose.  I recommend that she consider increasing her amitriptyline to 50 mg nightly for analgesic/neuropathic benefit.  She has tried lumbar spinal injections and is not interested in any interventional treatments.  She is not interested in lidocaine infusion for lower extremity neuropathic pain.  I recommend that she consider physical therapy but she is not interested.  Patient does have a history of severe obesity which I think is strongly contributing to her lower back musculoskeletal pain.  I recommend aquatic therapy for the patient she declined.  I do not recommend chronic opioid therapy for her condition in the context of her being on multiple sedative medications, poor historian, psychiatric history including depression and anxiety, significant cardiac history and concerns of obstructive  sleep apnea as well as a history of chronic constipation.  I also counseled the patient on smoking cessation. Given that the patient is not a candidate for chronic opioid therapy nor is she  interested in any physical therapy or interventional treatments, and follow-up with her primary care provider for further care.  02/03/2019: Patient presents today for her second patient visit.  She is requesting "stronger pain medication".  Of note, she does not have a contract with our pain clinic nor do I plan on prescribing any long-term opioid therapy for her.  She was prescribed quantity 28 of oxycodone on 4/5 and has ran out of those already.  She did have a right lower extremity angiography on 01/24/2021.  She may also need to have a large ventral hernia surgery in the future.  Her surgeon has told her that this procedure carries significant morbidity and is high risk.  Patient never started alpha lipoic acid as I recommended during her initial clinic visit.  When I asked her why, she states that she forgot.  It is obvious that the patient is a poor historian.  She continues to smoke daily.  She has depression and anxiety which in my opinion increases her risk of opioid misuse and abuse.  Given that she is planning on having a big surgery, I informed her that it is in her best interest to try and wean and discontinue any opioid analgesics so that they can be utilized more effective in the postoperative period for her postoperative pain.  I will refer the patient to physical therapy and I have instructed her to trial alpha lipoic acid.  She is not interested in any procedures such as intra-articular Hyalgan for her knees or genicular nerve block for her knees which are nonsteroidal-based procedures.  At this point, I have limited options.  Patient is only interested in opioid analgesics and I have my concerns with her being on this long-term given her history of prior medical noncompliance, morbid obesity, smoking history, anxiety and depression.  We discussed the importance of weight loss and activity.  I also discussed the importance of smoking cessation.   ROS  Constitutional: Denies any fever or  chills Gastrointestinal: No reported hemesis, hematochezia, vomiting, or acute GI distress Musculoskeletal: Right lower quadrant abdominal pain, polyarthralgias, polymyalgias. Neurological: No reported episodes of acute onset apraxia, aphasia, dysarthria, agnosia, amnesia, paralysis, loss of coordination, or loss of consciousness  Medication Review  Alpha-Lipoic Acid, Ipratropium-Albuterol, albuterol, amLODipine, apixaban, aspirin EC, atorvastatin, buPROPion, citalopram, ferrous sulfate, furosemide, metoprolol tartrate, multivitamin with minerals, omeprazole, oxyCODONE, and pregabalin  History Review  Allergy: Ms. Todaro is allergic to gabapentin and bactrim [sulfamethoxazole-trimethoprim]. Drug: Ms. Sinning  reports no history of drug use. Alcohol:  reports no history of alcohol use. Tobacco:  reports that she has been smoking cigarettes. She has been smoking about 0.10 packs per day. She has never used smokeless tobacco. Social: Ms. Vastine  reports that she has been smoking cigarettes. She has been smoking about 0.10 packs per day. She has never used smokeless tobacco. She reports that she does not drink alcohol and does not use drugs. 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[place]), Essential hypertension with goal blood pressure less than 140/90 (01/19/2016), Gastroesophageal reflux disease (01/19/2016), GERD (gastroesophageal reflux disease), H/O colectomy, History of kidney stones,  Hyperlipidemia, Hypertension, Hypotension (07/29/2017), Morbid obesity with BMI of 40.0-44.9, adult ([place]), Occlusive mesenteric ischemia ([place]) (06/19/2017), Personal history of other malignant neoplasm of skin (06/01/2016), Pre-diabetes, Primary osteoarthritis of both knees (06/13/2017), Recurrent major depressive disorder ([place]) (01/19/2016), SBO (small bowel obstruction) ([place]) (08/06/2017), 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 Surgical: Ms. 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 Family: family history includes Breast cancer in her mother and paternal aunt; Heart attack in her mother; Heart disease in her mother; Hypertension in her father and mother.  Laboratory Chemistry Profile   Renal Lab Results  Component Value Date   BUN 18 01/27/2021   CREATININE 0.95 01/27/2021   GFRAA >60 11/23/2019   GFRNONAA >60 01/27/2021     Hepatic Lab Results  Component Value Date   AST 15 09/30/2020   ALT 13 09/30/2020   ALBUMIN 3.3 (L) 09/30/2020   ALKPHOS 89 09/30/2020   LIPASE 35 11/10/2018   AMMONIA 28 04/08/2018     Electrolytes Lab Results  Component Value Date   NA 141 01/27/2021   K 4.6 01/27/2021   CL 106 01/27/2021   CALCIUM 8.2 (L) 01/27/2021   MG 2.2 01/27/2021   PHOS 5.3 (H) 01/27/2021     Bone No results found for: VD25OH, [id number], FF6384YK5, LD3570VX7, 25OHVITD1, 25OHVITD2, 25OHVITD3, TESTOFREE,  TESTOSTERONE   Inflammation (CRP: Acute Phase) (ESR: Chronic Phase) Lab Results  Component Value Date   ESRSEDRATE 49 (H) 07/29/2017   LATICACIDVEN 1.9 09/17/2018       Note: Above Lab results reviewed.  Recent Imaging Review  VAS US AORTA/IVC/ILIACS ABDOMINAL AORTA STUDY  Vascular Interventions: 12/13/2020 right cia,eia thrombectomy.    Performing Technologist: Concha Norway RVT    Examination Guidelines: A complete evaluation includes B-mode imaging, spectral Doppler, color Doppler, and power Doppler as needed of all accessible portions of each vessel. Bilateral testing is considered an integral part of a complete examination. Limited examinations for reoccurring indications may be performed as noted.    Abdominal Aorta Findings: +-------------+------+----------+---------+------------------+--------+--------+ Location     AP    Trans (cm)PSV      Waveform          ThrombusComments              (cm)            (cm/s)                                      +-------------+------+----------+---------+------------------+--------+--------+ Proximal                     40                                          +-------------+------+----------+---------+------------------+--------+--------+ Mid  42                                          +-------------+------+----------+---------+------------------+--------+--------+ Distal                       46                                          +-------------+------+----------+---------+------------------+--------+--------+ RT CIA Prox                           not visualized                     +-------------+------+----------+---------+------------------+--------+--------+ RT CIA Mid                   73       dampened                                                                 monophasic                          +-------------+------+----------+---------+------------------+--------+--------+ RT CIA Distal                77       dampened monophasi                 +-------------+------+----------+---------+------------------+--------+--------+ RT EIA Prox                  77       dampened monophasi                 +-------------+------+----------+---------+------------------+--------+--------+ RT EIA Mid                   63       dampened monophasi                 +-------------+------+----------+---------+------------------+--------+--------+ RT EIA Distal                47       dampened monophasi                 +-------------+------+----------+---------+------------------+--------+--------+ LT CIA Mid                   136      biphasic                           +-------------+------+----------+---------+------------------+--------+--------+ LT CIA Distal                135      biphasic                           +-------------+------+----------+---------+------------------+--------+--------+ LT EIA Prox                  140      monophasic                         +-------------+------+----------+---------+------------------+--------+--------+  LT EIA Mid                   136      monophasic                         +-------------+------+----------+---------+------------------+--------+--------+ LT EIA Distal                135      monophasic                         +-------------+------+----------+---------+------------------+--------+--------+     Summary: IVC/Iliac: Right iliac system shows low dampened monophasic flow and proximal CIA is not visualized consistent with possible reocclusion or significant stenosis status post Iliac thrombectomy if the righr CIA/EIA. Left iliac system is patent.   *See table(s) above for measurements and observations.   Electronically signed by [name] MD on 01/25/2021 at 9:35:47  AM.    Final   PERIPHERAL VASCULAR CATHETERIZATION See op note Note: Reviewed        Physical Exam  General appearance: Well nourished, well developed, and well hydrated. In no apparent acute distress Mental status: Alert, oriented x 3 (person, place, & time)       Respiratory: No evidence of acute respiratory distress Eyes: PERLA Vitals: BP (!) 94/59 (BP Location: Right Arm, Patient Position: Sitting, Cuff Size: Normal)   Pulse 62   Temp (!) 96.8 F (36 C) (Temporal)   Resp 16   Ht $R'5\' 2"'vG$  (1.575 m)   Wt 235 lb (106.6 kg)   SpO2 97%   BMI 42.98 kg/m  BMI: Estimated body mass index is 42.98 kg/m as calculated from the following:   Height as of this encounter: $RemoveBeforeD'5\' 2"'[id number]$  (1.575 m).   Weight as of this encounter: 235 lb (106.6 kg). Ideal: Ideal body weight: 50.1 kg (110 lb 7.2 oz) Adjusted ideal body weight: 72.7 kg (160 lb 4.3 oz)  Lumbar Exam  Skin & Axial Inspection: No masses, redness, or swelling Alignment: Symmetrical Functional ROM: Pain restricted ROM       Stability: No instability detected Muscle Tone/Strength: Functionally intact. No obvious neuro-muscular anomalies detected. Sensory (Neurological): Musculoskeletal pain pattern   Gait & Posture Assessment  Ambulation: Limited Gait: Antalgic gait (limping) Posture: Difficulty standing up straight, due to pain   Lower Extremity Exam    Side: Right lower extremity  Side: Left lower extremity  Stability: No instability observed          Stability: No instability observed          Skin & Extremity Inspection: Evidence of prior arthroplastic surgery  Skin & Extremity Inspection: Evidence of prior arthroplastic surgery  Functional ROM: Pain restricted ROM                  Functional ROM: Pain restricted ROM                  Muscle Tone/Strength: Functionally intact. No obvious neuro-muscular anomalies detected.  Muscle Tone/Strength: Functionally intact. No obvious neuro-muscular anomalies detected.  Sensory  (Neurological): Musculoskeletal pain pattern        Sensory (Neurological): Musculoskeletal pain pattern        DTR: Patellar: deferred today Achilles: deferred today Plantar: deferred today  DTR: Patellar: deferred today Achilles: deferred today Plantar: deferred today  Palpation: No palpable anomalies  Palpation: No palpable anomalies     Assessment   Status Diagnosis  Persistent Persistent  Persistent 1. Chronic pain syndrome   2. Chronic knee pain (Primary Area of Pain) (Bilateral)   3. Osteoarthritis of knee (Bilateral)   4. Chronic low back pain (Secondary area of Pain) (Bilateral) w/o sciatica      Updated Problems: Problem  Lumbar Spondylosis  Ddd (Degenerative Disc Disease), Lumbosacral  Osteoarthritis of knee (Bilateral)  Chronic Pain Syndrome  Chronic knee pain (Primary Area of Pain) (Bilateral)    Plan of Care   Ms. ATISHA HAMIDI has a current medication list which includes the following long-term medication(s): amlodipine, apixaban, ferrous sulfate, furosemide, ipratropium-albuterol, metoprolol tartrate, pregabalin, albuterol, and bupropion.  Pharmacotherapy (Medications Ordered): Meds ordered this encounter  Medications  . Alpha-Lipoic Acid 600 MG CAPS    Sig: Take 1 capsule (600 mg total) by mouth daily.    Dispense:  30 capsule    Refill:  0    Do not place medication on "Automatic Refill". Fill one day early if pharmacy is closed on scheduled refill date.   Orders:  Orders Placed This Encounter  Procedures  . Ambulatory referral to Physical Therapy    Referral Priority:   Routine    Referral Type:   Physical Medicine    Referral Reason:   Specialty Services Required    Requested Specialty:   Physical Therapy    Number of Visits Requested:   1   Smoking cessation counseling: 1. Pt acknowledges the risks of long term smoking, she will try to quit smoking. 2. Options for different medications including nicotine products, chewing gum, patch etc,  Wellbutrin and Chantix is discussed    Follow-up plan:   No follow-ups on file.   Recent Visits No visits were found meeting these conditions. Showing recent visits within past 90 days and meeting all other requirements Today's Visits Date Type Provider Dept  02/02/21 Office Visit Gillis Santa, MD [facility]  Showing today's visits and meeting all other requirements Future Appointments No visits were found meeting these conditions. Showing future appointments within next 90 days and meeting all other requirements  I discussed the assessment and treatment plan with the patient. The patient was provided an opportunity to ask questions and all were answered. The patient agreed with the plan and demonstrated an understanding of the instructions.  Patient advised to call back or seek an in-person evaluation if the symptoms or condition worsens.  Duration of encounter: 66minutes.  Note by: Gillis Santa, MD Date: 02/02/2021; Time: 1:55 PM

## 2021-02-02 NOTE — Progress Notes (Signed)
Safety precautions to be maintained throughout the outpatient stay will include: orient to surroundings, keep bed in low position, maintain call bell within reach at all times, provide assistance with transfer out of bed and ambulation.  

## 2021-02-14 ENCOUNTER — Ambulatory Visit: Payer: Medicare Other | Admitting: Surgery

## 2021-02-15 ENCOUNTER — Ambulatory Visit (INDEPENDENT_AMBULATORY_CARE_PROVIDER_SITE_OTHER): Payer: Medicare Other | Admitting: Vascular Surgery

## 2021-02-15 ENCOUNTER — Encounter (INDEPENDENT_AMBULATORY_CARE_PROVIDER_SITE_OTHER): Payer: Medicare Other

## 2021-02-16 ENCOUNTER — Ambulatory Visit (INDEPENDENT_AMBULATORY_CARE_PROVIDER_SITE_OTHER): Payer: Medicare Other | Admitting: Surgery

## 2021-02-16 ENCOUNTER — Encounter: Payer: Self-pay | Admitting: Surgery

## 2021-02-16 ENCOUNTER — Other Ambulatory Visit: Payer: Self-pay

## 2021-02-16 VITALS — BP 121/82 | HR 65 | Temp 98.2°F | Ht 62.0 in | Wt 220.6 lb

## 2021-02-16 DIAGNOSIS — K432 Incisional hernia without obstruction or gangrene: Secondary | ICD-10-CM

## 2021-02-16 MED ORDER — PREGABALIN 200 MG PO CAPS: 200.0000 mg | ORAL_CAPSULE | Freq: Two times a day (BID) | ORAL | 0 refills | Status: DC

## 2021-02-16 MED ORDER — NICOTINE 14 MG/24HR TD PT24: 14.0000 mg | MEDICATED_PATCH | Freq: Every day | TRANSDERMAL | 0 refills | Status: DC

## 2021-02-16 NOTE — Patient Instructions (Signed)
See follow up appointment below. Please pick up the prescriptions at your pharmacy. If you have any concerns or questions, please feel free to call our office.

## 2021-02-18 NOTE — Progress Notes (Signed)
Outpatient Surgical Follow Up  02/18/2021  Carla [name] is an 60 y.o. female.   Chief Complaint  Patient presents with  . Follow-up    Ventral hernia, discuss surgery    HPI: Recurrent ventral hernia with loss of domain on a patient with morbid obesity, active smoking, peripheral vascular disease.  Recent revascularization is currently on anticoagulation and antiplatelet therapy.  She does have chronic pain.  I am going to refill the Lyrica given that she has chronic pain.  I also discussed with her that this will be by only refill for Lyrica and she needs to seek PCP advice regarding this.  She is recovering from vascular intervention that required hospitalization.  She seems to have better lower extremity pain but continues to have chronic pain.  She continues to have pain at the recurrent ventral hernia site.  Past Medical History:  Diagnosis Date  . Abdominal aortic atherosclerosis ([place])    a. 05/2017 CTA abd/pelvis: significant atherosclerotic dzs of the infrarenal abd Ao w/ some mural thrombus. No aneurysm or dissection.  . Abdominal pain   . Acute focal ischemia of small intestine (HCC)   . Acute right-sided low back pain with right-sided sciatica 06/13/2017  . AKI (acute kidney injury) ([place]) 07/29/2017  . Baker's cyst of knee, right    a. 07/2016 U/S: 4.1 x 1.4 x 2.9 cystic structure in R poplitetal fossa.  . Bell's palsy   . Borderline diabetes   . Cancer ([place])    skin cancer on nose  . Chest pain    a. 08/2012 Lexiscan MV: EF 54%, non ischemia/infarct.  . CHF (congestive heart failure) ([place])   . Chronic idiopathic constipation   . Chronic mesenteric ischemia ([place])   . COPD (chronic obstructive pulmonary disease) ([place])   . Diastolic dysfunction    a. 05/2017 Echo: Ef 60-65%, no rwma, Gr1 DD, no source of cardiac emboli.  . 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 05/2017 CTA Abd/pelvis: apparent thrombus or embolus in prox SMA (70-90%); b. 05/2017 catheter directed  tPA, mechanical thrombectomy, and stenting of the SMA.  . Essential hypertension with goal blood pressure less than 140/90 01/19/2016  . Gastroesophageal reflux disease 01/19/2016  . GERD (gastroesophageal reflux disease)   . H/O colectomy   . History of kidney stones   . Hyperlipidemia   . Hypertension   . Hypotension 07/29/2017  . Morbid obesity with BMI of 40.0-44.9, adult ([place])   . Occlusive mesenteric ischemia ([place]) 06/19/2017  . Personal history of other malignant neoplasm of skin 06/01/2016  . Pre-diabetes   . Primary osteoarthritis of both knees 06/13/2017  . Recurrent major depressive disorder ([place]) 01/19/2016  . SBO (small bowel obstruction) ([place]) 08/06/2017  . 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 Urinary tract infection 01/09/2014    Past Surgical History:  Procedure Laterality Date  . AMPUTATION TOE Left 12/09/2019   Procedure: AMPUTATION TOE MPJ left;  Surgeon: Caroline More, DPM;  Location: [facility] ORS;  Service: Podiatry;  Laterality: Left;  . APPENDECTOMY    . CHOLECYSTECTOMY    . LAPAROTOMY N/A 08/08/2017   Procedure: EXPLORATORY LAPAROTOMY POSSIBLE BOWEL RESECTION;  Surgeon: Jules Husbands, MD;  Location: [facility] ORS;  Service: General;  Laterality: N/A;  . LOWER EXTREMITY ANGIOGRAPHY Left 11/17/2019   Procedure: LOWER EXTREMITY ANGIOGRAPHY;  Surgeon: Algernon Huxley, MD;  Location: [place] [facility];  Service: Cardiovascular;  Laterality: Left;  . LOWER EXTREMITY ANGIOGRAPHY Right 12/13/2020  Procedure: LOWER EXTREMITY ANGIOGRAPHY;  Surgeon: Algernon Huxley, MD;  Location: [facility];  Service: Cardiovascular;  Laterality: Right;  . LOWER EXTREMITY ANGIOGRAPHY Right 01/10/2021   Procedure: LOWER EXTREMITY ANGIOGRAPHY;  Surgeon: Algernon Huxley, MD;  Location: [facility];  Service: Cardiovascular;  Laterality: Right;  . LOWER EXTREMITY ANGIOGRAPHY Right 01/24/2021   Procedure: LOWER EXTREMITY ANGIOGRAPHY;  Surgeon: Algernon Huxley, MD;  Location: [place] [facility] [place];  Service: Cardiovascular;  Laterality: Right;  . LOWER EXTREMITY INTERVENTION N/A 11/19/2019   Procedure: LOWER EXTREMITY INTERVENTION;  Surgeon: Algernon Huxley, MD;  Location: [place];  Service: Cardiovascular;  Laterality: N/A;  . TEE WITHOUT CARDIOVERSION N/A 06/22/2017   Procedure: TRANSESOPHAGEAL ECHOCARDIOGRAM (TEE);  Surgeon: Wellington Hampshire, MD;  Location: [facility];  Service: Cardiovascular;  Laterality: N/A;  . TOTAL KNEE ARTHROPLASTY Right 07/11/2018   Procedure: TOTAL KNEE ARTHROPLASTY;  Surgeon: Corky Mull, MD;  Location: [facility];  Service: Orthopedics;  Laterality: Right;  [name] VAGINAL HYSTERECTOMY    . VISCERAL ARTERY INTERVENTION N/A 06/20/2017   Procedure: Visceral Artery Intervention, possible aortic thrombectomy;  Surgeon: Algernon Huxley, MD;  Location: [facility];  Service: Cardiovascular;  Laterality: N/A;  . VISCERAL ARTERY INTERVENTION N/A 01/28/2018   Procedure: VISCERAL ARTERY INTERVENTION;  Surgeon: Algernon Huxley, MD;  Location: [facility];  Service: Cardiovascular;  Laterality: N/A;    Family History  Problem Relation Age of Onset  . Hypertension Mother   . Heart disease Mother   . Heart attack Mother   . Breast cancer Mother   . Hypertension Father   . Breast cancer Paternal Aunt     Social History:  reports that she has been smoking cigarettes. She has been smoking about 0.10 packs per day. She has never used smokeless tobacco. She reports that she does not drink alcohol and does not use drugs.  Allergies:  Allergies  Allergen Reactions  . Gabapentin     Tremors   . Bactrim [Sulfamethoxazole-Trimethoprim] Itching    Medications reviewed.    ROS Full ROS performed and is otherwise negative other than what is stated in HPI   BP 121/82   Pulse 65   Temp 98.2 F (36.8 C) (Oral)   Ht 5\' 2"  (1.575 m)   Wt 220 lb 9.6 oz (100.1 kg)   SpO2 94%   BMI 40.35 kg/m   Physical Exam   Physical Exam NAD Morbidly  obese Abd: soft, Large ventral hernias with loss of domain preputial ulcerations have healed well this is likely as a result of ischemia.  There is no evidence of exposed bowel there is no evidence of peritonitis.  This hernia is currently chronically incarcerated Ext: cool extremities, no cyanosis  Assessment/Plan: Recurrent ventral hernia with significant pain .  This poses a very significant challenge. In addition to that she has significant peripheral vascular disease vascular intervention.  There is a question about being noncompliant.  She is currently anticoagulated and on antiplatelet therapy.  Given that the vascular procedure is fairly recent I do want make sure that she is sterilizes before surgical intervention.   Very complicated situation with no good answers.  She is morbidly obese has significant peripheral vascular disease is actively smoking.  I do think that we are approaching a point that there is no other good options but to repair the hernia.  She understands that this has significant morbidity and even mortality.  Would like to see her in 6 to 8 weeks once her vascular issues is stabilizes and at that time we assess for definitive surgical intervention.  She understands that she is in a difficult position. She also understands about her surgical risk and is willing to assume those   I have also offered her to transfer to a tertiary center and currently she is not interested in that.   Greater than 50% of the 20 minutes  visit was spent in counseling/coordination of care   Caroleen Hamman, MD [facility] Surgeon

## 2021-02-24 ENCOUNTER — Other Ambulatory Visit (INDEPENDENT_AMBULATORY_CARE_PROVIDER_SITE_OTHER): Payer: Self-pay | Admitting: Vascular Surgery

## 2021-02-24 DIAGNOSIS — Z95820 Peripheral vascular angioplasty status with implants and grafts: Secondary | ICD-10-CM

## 2021-02-24 DIAGNOSIS — I70221 Atherosclerosis of native arteries of extremities with rest pain, right leg: Secondary | ICD-10-CM

## 2021-02-25 ENCOUNTER — Other Ambulatory Visit: Payer: Self-pay

## 2021-02-25 ENCOUNTER — Ambulatory Visit (INDEPENDENT_AMBULATORY_CARE_PROVIDER_SITE_OTHER): Payer: Medicare Other

## 2021-02-25 ENCOUNTER — Ambulatory Visit (INDEPENDENT_AMBULATORY_CARE_PROVIDER_SITE_OTHER): Payer: Medicare Other | Admitting: Nurse Practitioner

## 2021-02-25 ENCOUNTER — Encounter (INDEPENDENT_AMBULATORY_CARE_PROVIDER_SITE_OTHER): Payer: Self-pay | Admitting: Nurse Practitioner

## 2021-02-25 VITALS — BP 113/76 | HR 85 | Resp 16 | Wt 235.0 lb

## 2021-02-25 DIAGNOSIS — I10 Essential (primary) hypertension: Secondary | ICD-10-CM

## 2021-02-25 DIAGNOSIS — I998 Other disorder of circulatory system: Secondary | ICD-10-CM

## 2021-02-25 DIAGNOSIS — M792 Neuralgia and neuritis, unspecified: Secondary | ICD-10-CM | POA: Diagnosis not present

## 2021-02-25 DIAGNOSIS — Z95820 Peripheral vascular angioplasty status with implants and grafts: Secondary | ICD-10-CM | POA: Diagnosis not present

## 2021-02-25 DIAGNOSIS — Z716 Tobacco abuse counseling: Secondary | ICD-10-CM

## 2021-02-25 DIAGNOSIS — I70221 Atherosclerosis of native arteries of extremities with rest pain, right leg: Secondary | ICD-10-CM | POA: Diagnosis not present

## 2021-02-25 DIAGNOSIS — E785 Hyperlipidemia, unspecified: Secondary | ICD-10-CM | POA: Diagnosis not present

## 2021-02-25 MED ORDER — TRAMADOL HCL 50 MG PO TABS: 50.0000 mg | ORAL_TABLET | Freq: Four times a day (QID) | ORAL | 0 refills | Status: DC | PRN

## 2021-02-26 NOTE — Progress Notes (Signed)
Subjective:    Patient ID: Carla [name], female    DOB: 1961/09/15, 60 y.o.   MRN: [id number] Chief Complaint  Patient presents with  . Follow-up    [facility] 1 month post le angio    The patient returns to the office for followup and review status post angiogram with intervention. The patient notes improvement in the lower extremity symptoms. No interval shortening of the patient's claudication distance or rest pain symptoms. Previous wounds have now healed.  No new ulcers or wounds have occurred since the last visit.  The patient has a very large hernia that will have to be repaired soon.  The patient notes that the surgery is expected to be large with her healing time extended period  The patient denies amaurosis fugax or recent TIA symptoms. There are no recent neurological changes noted. The patient denies history of DVT, PE or superficial thrombophlebitis. The patient denies recent episodes of angina or shortness of breath.   ABI's Rt=1.11 and Lt=1.10  (previous ABI's Rt=0.42 and Lt=0.86) Duplex US of the bilateral lower extremity shows biphasic/triphasic waveforms with good toe waveforms bilaterally.   Review of Systems  Neurological: Positive for numbness.  Psychiatric/Behavioral: The patient is nervous/anxious.   All other systems reviewed and are negative.      Objective:   Physical Exam Vitals reviewed.  HENT:     Head: Normocephalic.  Cardiovascular:     Rate and Rhythm: Normal rate.     Pulses: Decreased pulses.  Pulmonary:     Effort: Pulmonary effort is normal.  Skin:    General: Skin is warm and dry.  Neurological:     Mental Status: She is alert and oriented to person, place, and time.  Psychiatric:        Mood and Affect: Mood is anxious.        Behavior: Behavior normal.        Thought Content: Thought content normal.        Judgment: Judgment normal.     BP 113/76 (BP Location: Right Arm)   Pulse 85   Resp 16   Wt 235 lb (106.6 kg)   BMI 42.98  kg/m   Past Medical History:  Diagnosis Date  . Abdominal aortic atherosclerosis ([place])    a. 05/2017 CTA abd/pelvis: significant atherosclerotic dzs of the infrarenal abd Ao w/ some mural thrombus. No aneurysm or dissection.  . Abdominal pain   . Acute focal ischemia of small intestine (HCC)   . Acute right-sided low back pain with right-sided sciatica 06/13/2017  . AKI (acute kidney injury) ([place]) 07/29/2017  . Baker's cyst of knee, right    a. 07/2016 U/S: 4.1 x 1.4 x 2.9 cystic structure in R poplitetal fossa.  . Bell's palsy   . Borderline diabetes   . Cancer ([place])    skin cancer on nose  . Chest pain    a. 08/2012 Lexiscan MV: EF 54%, non ischemia/infarct.  . CHF (congestive heart failure) ([place])   . Chronic idiopathic constipation   . Chronic mesenteric ischemia ([place])   . COPD (chronic obstructive pulmonary disease) ([place])   . Diastolic dysfunction    a. 05/2017 Echo: Ef 60-65%, no rwma, Gr1 DD, no source of cardiac emboli.  . 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[place])    a. 05/2017 CTA Abd/pelvis: apparent thrombus or embolus in prox SMA (70-90%); b. 05/2017 catheter directed tPA, mechanical thrombectomy, and stenting of the SMA.  . Essential hypertension with goal blood pressure  less than 140/90 01/19/2016  . Gastroesophageal reflux disease 01/19/2016  . GERD (gastroesophageal reflux disease)   . H/O colectomy   . History of kidney stones   . Hyperlipidemia   . Hypertension   . Hypotension 07/29/2017  . Morbid obesity with BMI of 40.0-44.9, adult ([place])   . Occlusive mesenteric ischemia ([place]) 06/19/2017  . Personal history of other malignant neoplasm of skin 06/01/2016  . Pre-diabetes   . Primary osteoarthritis of both knees 06/13/2017  . Recurrent major depressive disorder ([place]) 01/19/2016  . SBO (small bowel obstruction) ([place]) 08/06/2017  . 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 Urinary tract infection 01/09/2014    Social History   Socioeconomic History  . Marital  status: Significant Other    Spouse name: [name]   . Number of children: Not on file  . Years of education: Not on file  . Highest education level: Not on file  Occupational History  . Occupation: Unemployed  Tobacco Use  . Smoking status: Current Some Day Smoker    Packs/day: 0.10    Types: Cigarettes  . Smokeless tobacco: Never Used  Vaping Use  . Vaping Use: Never used  Substance and Sexual Activity  . Alcohol use: No  . Drug use: No  . Sexual activity: Not on file  Other Topics Concern  . Not on file  Social History Narrative   Lives in [place] with boyfriend.  Does not routinely exercise.   Social Determinants of Health   Financial Resource Strain: Not on file  Food Insecurity: Not on file  Transportation Needs: Not on file  Physical Activity: Not on file  Stress: Not on file  Social Connections: Not on file  Intimate Partner Violence: Not on file    Past Surgical History:  Procedure Laterality Date  . AMPUTATION TOE Left 12/09/2019   Procedure: AMPUTATION TOE MPJ left;  Surgeon: Caroline More, DPM;  Location: [facility];  Service: Podiatry;  Laterality: Left;  . APPENDECTOMY    . CHOLECYSTECTOMY    . LAPAROTOMY N/A 08/08/2017   Procedure: EXPLORATORY LAPAROTOMY POSSIBLE BOWEL RESECTION;  Surgeon: Jules Husbands, MD;  Location: [facility];  Service: General;  Laterality: N/A;  . LOWER EXTREMITY ANGIOGRAPHY Left 11/17/2019   Procedure: LOWER EXTREMITY ANGIOGRAPHY;  Surgeon: Algernon Huxley, MD;  Location: [facility] [facility];  Service: Cardiovascular;  Laterality: Left;  . LOWER EXTREMITY ANGIOGRAPHY Right 12/13/2020   Procedure: LOWER EXTREMITY ANGIOGRAPHY;  Surgeon: Algernon Huxley, MD;  Location: [place] [facility];  Service: Cardiovascular;  Laterality: Right;  . LOWER EXTREMITY ANGIOGRAPHY Right 01/10/2021   Procedure: LOWER EXTREMITY ANGIOGRAPHY;  Surgeon: Algernon Huxley, MD;  Location: [facility];  Service: Cardiovascular;  Laterality: Right;  . LOWER  EXTREMITY ANGIOGRAPHY Right 01/24/2021   Procedure: LOWER EXTREMITY ANGIOGRAPHY;  Surgeon: Algernon Huxley, MD;  Location: [facility] [place] [facility];  Service: Cardiovascular;  Laterality: Right;  . LOWER EXTREMITY INTERVENTION N/A 11/19/2019   Procedure: LOWER EXTREMITY INTERVENTION;  Surgeon: Algernon Huxley, MD;  Location: [facility];  Service: Cardiovascular;  Laterality: N/A;  . TEE WITHOUT CARDIOVERSION N/A 06/22/2017   Procedure: TRANSESOPHAGEAL ECHOCARDIOGRAM (TEE);  Surgeon: Wellington Hampshire, MD;  Location: [facility];  Service: Cardiovascular;  Laterality: N/A;  . TOTAL KNEE ARTHROPLASTY Right 07/11/2018   Procedure: TOTAL KNEE ARTHROPLASTY;  Surgeon: Corky Mull, MD;  Location: [facility];  Service: Orthopedics;  Laterality: Right;  [name] VAGINAL HYSTERECTOMY    . VISCERAL  ARTERY INTERVENTION N/A 06/20/2017   Procedure: Visceral Artery Intervention, possible aortic thrombectomy;  Surgeon: Algernon Huxley, MD;  Location: [facility];  Service: Cardiovascular;  Laterality: N/A;  . VISCERAL ARTERY INTERVENTION N/A 01/28/2018   Procedure: VISCERAL ARTERY INTERVENTION;  Surgeon: Algernon Huxley, MD;  Location: [facility];  Service: Cardiovascular;  Laterality: N/A;    Family History  Problem Relation Age of Onset  . Hypertension Mother   . Heart disease Mother   . Heart attack Mother   . Breast cancer Mother   . Hypertension Father   . Breast cancer Paternal Aunt     Allergies  Allergen Reactions  . Gabapentin     Tremors   . Bactrim [Sulfamethoxazole-Trimethoprim] Itching    CBC Latest Ref Rng & Units 01/27/2021 01/26/2021 01/25/2021  WBC 4.0 - 10.5 K/uL 6.7 6.0 6.4  Hemoglobin 12.0 - 15.0 g/dL 10.1(L) 10.5(L) 10.8(L)  Hematocrit 36.0 - 46.0 % 31.4(L) 31.4(L) 33.8(L)  Platelets 150 - 400 K/uL 203 199 217      CMP     Component Value Date/Time   NA 141 01/27/2021 0315   NA 140 02/12/2015 1137   K 4.6 01/27/2021 0315   K 2.9 (L) 02/12/2015 1137   CL 106 01/27/2021 0315    CL 107 02/12/2015 1137   CO2 26 01/27/2021 0315   CO2 27 02/12/2015 1137   GLUCOSE 96 01/27/2021 0315   GLUCOSE 120 (H) 02/12/2015 1137   BUN 18 01/27/2021 0315   BUN 15 02/12/2015 1137   CREATININE 0.95 01/27/2021 0315   CREATININE 0.78 02/12/2015 1137   CALCIUM 8.2 (L) 01/27/2021 0315   CALCIUM 9.0 02/12/2015 1137   PROT 7.0 09/30/2020 1124   PROT 7.0 09/14/2020 1139   PROT 7.1 01/19/2015 1112   ALBUMIN 3.3 (L) 09/30/2020 1124   ALBUMIN 3.9 09/14/2020 1139   ALBUMIN 3.8 01/19/2015 1112   AST 15 09/30/2020 1124   AST 17 01/19/2015 1112   ALT 13 09/30/2020 1124   ALT 11 (L) 01/19/2015 1112   ALKPHOS 89 09/30/2020 1124   ALKPHOS 84 01/19/2015 1112   BILITOT 0.5 09/30/2020 1124   BILITOT <0.2 09/14/2020 1139   BILITOT 0.5 01/19/2015 1112   GFRNONAA >60 01/27/2021 0315   GFRNONAA >60 02/12/2015 1137   GFRAA >60 11/23/2019 1330   GFRAA >60 02/12/2015 1137     VAS [place] ABI WITH/WO TBI  Result Date: 01/12/2021 LOWER EXTREMITY DOPPLER STUDY Indications: Rest pain, peripheral artery disease, and Painful cold Lt Foot.  Vascular Interventions: 12/13/20 right fem endart                         11/17/2019: Aortogram and Selective Angiogram including                         selctive imaging of the Left ATA and PTA. PTA of the                         Left Anterior Tibial Artery. PTA of the Left Posterior                         tibial Artery. Mechanical Thrombectomy to both the Left                         ATA and PTA.  11/19/2019: Aortogram and Bilateral Iliofemoral                         Angiograms. Mechanical thrombectomy to the Left CIA and                         EIA. Kissing balloon expandable stent placement to                         bilateral CIA's. Comparison Study: 12/04/19 Performing Technologist: Concha Norway RVT  Examination Guidelines: A complete evaluation includes at minimum, Doppler waveform signals and systolic blood pressure reading at the level of  bilateral brachial, anterior tibial, and posterior tibial arteries, when vessel segments are accessible. Bilateral testing is considered an integral part of a complete examination. Photoelectric Plethysmograph (PPG) waveforms and toe systolic pressure readings are included as required and additional duplex testing as needed. Limited examinations for reoccurring indications may be performed as noted.  ABI Findings: +---------+------------------+-----+--------+--------+ Right    Rt Pressure (mmHg)IndexWaveformComment  +---------+------------------+-----+--------+--------+ Brachial 147                                     +---------+------------------+-----+--------+--------+ ATA      62                0.42                  +---------+------------------+-----+--------+--------+ PTA      44                0.30                  +---------+------------------+-----+--------+--------+ Great Toe29                0.20                  +---------+------------------+-----+--------+--------+ +---------+------------------+-----+--------+-------+ Left     Lt Pressure (mmHg)IndexWaveformComment +---------+------------------+-----+--------+-------+ ATA      108               0.73                 +---------+------------------+-----+--------+-------+ PTA      127               0.86                 +---------+------------------+-----+--------+-------+ Great Toe61                0.41                 +---------+------------------+-----+--------+-------+ +-------+-----------+-----------+------------+------------+ ABI/TBIToday's ABIToday's TBIPrevious ABIPrevious TBI +-------+-----------+-----------+------------+------------+ Right  .42        .20        .79         .38          +-------+-----------+-----------+------------+------------+ Left   .86        .41        1.10        .53          +-------+-----------+-----------+------------+------------+ Right ABIs and TBIs  appear decreased compared to prior study on 12/13/20.  Summary: Right: Resting right ankle-brachial index indicates severe right lower extremity arterial disease. The right toe-brachial index is abnormal. Significant decrease in flow s/p thrombectomy. Left: Resting left ankle-brachial index indicates mild left  lower extremity arterial disease. The left toe-brachial index is abnormal.  *See table(s) above for measurements and observations.  Electronically signed by [name] MD on 01/12/2021 at 3:01:48 PM.    Final        Assessment & Plan:   1. Ischemia of right lower extremity  Recommend:  The patient has evidence of atherosclerosis of the lower extremities with claudication.  The patient does not voice lifestyle limiting changes at this point in time.  Noninvasive studies do not suggest clinically significant change.  No invasive studies, angiography or surgery at this time The patient should continue walking and begin a more formal exercise program.  The patient should continue antiplatelet therapy and aggressive treatment of the lipid abnormalities  No changes in the patient's medications at this time  The patient should continue wearing graduated compression socks 10-15 mmHg strength to control the mild edema.   Return in 3 months for noninvasive studies or sooner if issues should arise. 2. Tobacco abuse counseling Smoking cessation was discussed, 3-10 minutes spent on this topic specifically   3. Neuropathic pain The patient still continues to have severe neuropathic pain, largely from the ischemic event and left lower extremity.  The patient is currently seeing pain management but notes that the current medications are not effective for her.  The patient was advised to continue to work with pain management in regards to pain relief.  We will patient have 10 tramadol today however the patient is advised that this was a one-time prescription and there will be no refills given.  4.  Hyperlipidemia, unspecified hyperlipidemia type Continue statin as ordered and reviewed, no changes at this time   5. Essential hypertension with goal blood pressure less than 140/90 Continue antihypertensive medications as already ordered, these medications have been reviewed and there are no changes at this time.    Current Outpatient Medications on File Prior to Visit  Medication Sig Dispense Refill  . albuterol (VENTOLIN HFA) 108 (90 Base) MCG/ACT inhaler Inhale 2 puffs into the lungs every 6 (six) hours as needed for wheezing or shortness of breath.    . Alpha-Lipoic Acid 600 MG CAPS Take 1 capsule (600 mg total) by mouth daily. 30 capsule 0  . amLODipine (NORVASC) 5 MG tablet Take 1 tablet (5 mg total) by mouth daily. 30 tablet 2  . apixaban (ELIQUIS) 5 MG TABS tablet Take 1 tablet (5 mg total) by mouth 2 (two) times daily. 60 tablet 11  . aspirin EC 81 MG tablet Take 81 mg by mouth daily. Swallow whole.    [name] atorvastatin (LIPITOR) 80 MG tablet Take 80 mg by mouth at bedtime.    . citalopram (CELEXA) 20 MG tablet Take 40 mg by mouth daily.    . ferrous sulfate 325 (65 FE) MG tablet Take 325 mg by mouth daily.    . furosemide (LASIX) 40 MG tablet Take by mouth.    . metoprolol tartrate (LOPRESSOR) 50 MG tablet Take 1 tablet (50 mg total) by mouth 2 (two) times daily. 30 tablet 0  . nicotine (NICODERM CQ - DOSED IN MG/24 HOURS) 14 mg/24hr patch Place 1 patch (14 mg total) onto the skin daily. 30 patch 0  . omeprazole (PRILOSEC) 20 MG capsule Take 20 mg by mouth 2 (two) times daily before a meal.    . pregabalin (LYRICA) 200 MG capsule Take 1 capsule (200 mg total) by mouth 2 (two) times daily. 90 capsule 0   No current facility-administered medications on  file prior to visit.    There are no Patient Instructions on file for this visit. No follow-ups on file.   Kris Hartmann, NP

## 2021-02-27 ENCOUNTER — Encounter (INDEPENDENT_AMBULATORY_CARE_PROVIDER_SITE_OTHER): Payer: Self-pay | Admitting: Nurse Practitioner

## 2021-03-14 ENCOUNTER — Ambulatory Visit: Payer: Medicare Other | Admitting: Physical Therapy

## 2021-03-21 ENCOUNTER — Encounter: Payer: Self-pay | Admitting: Emergency Medicine

## 2021-03-21 ENCOUNTER — Emergency Department
Admission: EM | Admit: 2021-03-21 | Discharge: 2021-03-21 | Disposition: A | Discharge status: Home / Self Care | Payer: Medicare Other | Attending: Emergency Medicine | Admitting: Emergency Medicine

## 2021-03-21 DIAGNOSIS — Z7901 Long term (current) use of anticoagulants: Secondary | ICD-10-CM | POA: Insufficient documentation

## 2021-03-21 DIAGNOSIS — Z96651 Presence of right artificial knee joint: Secondary | ICD-10-CM | POA: Diagnosis not present

## 2021-03-21 DIAGNOSIS — Z7982 Long term (current) use of aspirin: Secondary | ICD-10-CM | POA: Diagnosis not present

## 2021-03-21 DIAGNOSIS — M545 Low back pain, unspecified: Secondary | ICD-10-CM | POA: Insufficient documentation

## 2021-03-21 DIAGNOSIS — Z79899 Other long term (current) drug therapy: Secondary | ICD-10-CM | POA: Insufficient documentation

## 2021-03-21 DIAGNOSIS — Z85828 Personal history of other malignant neoplasm of skin: Secondary | ICD-10-CM | POA: Insufficient documentation

## 2021-03-21 DIAGNOSIS — M542 Cervicalgia: Secondary | ICD-10-CM | POA: Diagnosis not present

## 2021-03-21 DIAGNOSIS — I5032 Chronic diastolic (congestive) heart failure: Secondary | ICD-10-CM | POA: Diagnosis not present

## 2021-03-21 DIAGNOSIS — Y92410 Unspecified street and highway as the place of occurrence of the external cause: Secondary | ICD-10-CM | POA: Insufficient documentation

## 2021-03-21 DIAGNOSIS — F17210 Nicotine dependence, cigarettes, uncomplicated: Secondary | ICD-10-CM | POA: Diagnosis not present

## 2021-03-21 DIAGNOSIS — I110 Hypertensive heart disease with heart failure: Secondary | ICD-10-CM | POA: Insufficient documentation

## 2021-03-21 DIAGNOSIS — J449 Chronic obstructive pulmonary disease, unspecified: Secondary | ICD-10-CM | POA: Insufficient documentation
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
## 2021-03-22 ENCOUNTER — Ambulatory Visit: Payer: Medicare Other | Admitting: Physical Therapy
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
## 2021-03-28 ENCOUNTER — Ambulatory Visit
Payer: Medicare Other | Attending: Student in an Organized Health Care Education/Training Program | Admitting: Physical Therapy

## 2021-03-28 ENCOUNTER — Ambulatory Visit: Payer: Medicare Other | Admitting: Physical Therapy

## 2021-03-29 ENCOUNTER — Telehealth (INDEPENDENT_AMBULATORY_CARE_PROVIDER_SITE_OTHER): Payer: Self-pay | Admitting: Vascular Surgery

## 2021-03-29 NOTE — Telephone Encounter (Signed)
Patient called in stating having extreme pain in both feet for the past couple of weeks.  Patient also states both feet will turn red and swell.  Sometimes it will go back to normal, but now it's constant. Patient is requesting an appointment.  Please advise.

## 2021-03-29 NOTE — Telephone Encounter (Signed)
Per Arna Medici bring the patient in for ABI's only and if not normal she will be seen. Thank you

## 2021-03-31 NOTE — Telephone Encounter (Signed)
Called and left VM for patient to come in for abi.

## 2021-04-04 ENCOUNTER — Ambulatory Visit: Payer: Medicare Other | Admitting: Physical Therapy

## 2021-04-10 IMAGING — DX DG WRIST COMPLETE 3+V*R*
4 series · 4 of 4 positions shown · non-contrast
Comparison: None.

CLINICAL DATA: Wrist pain

EXAM:
RIGHT WRIST - COMPLETE 3+ VIEW

[wrist ap (1 of 2)]
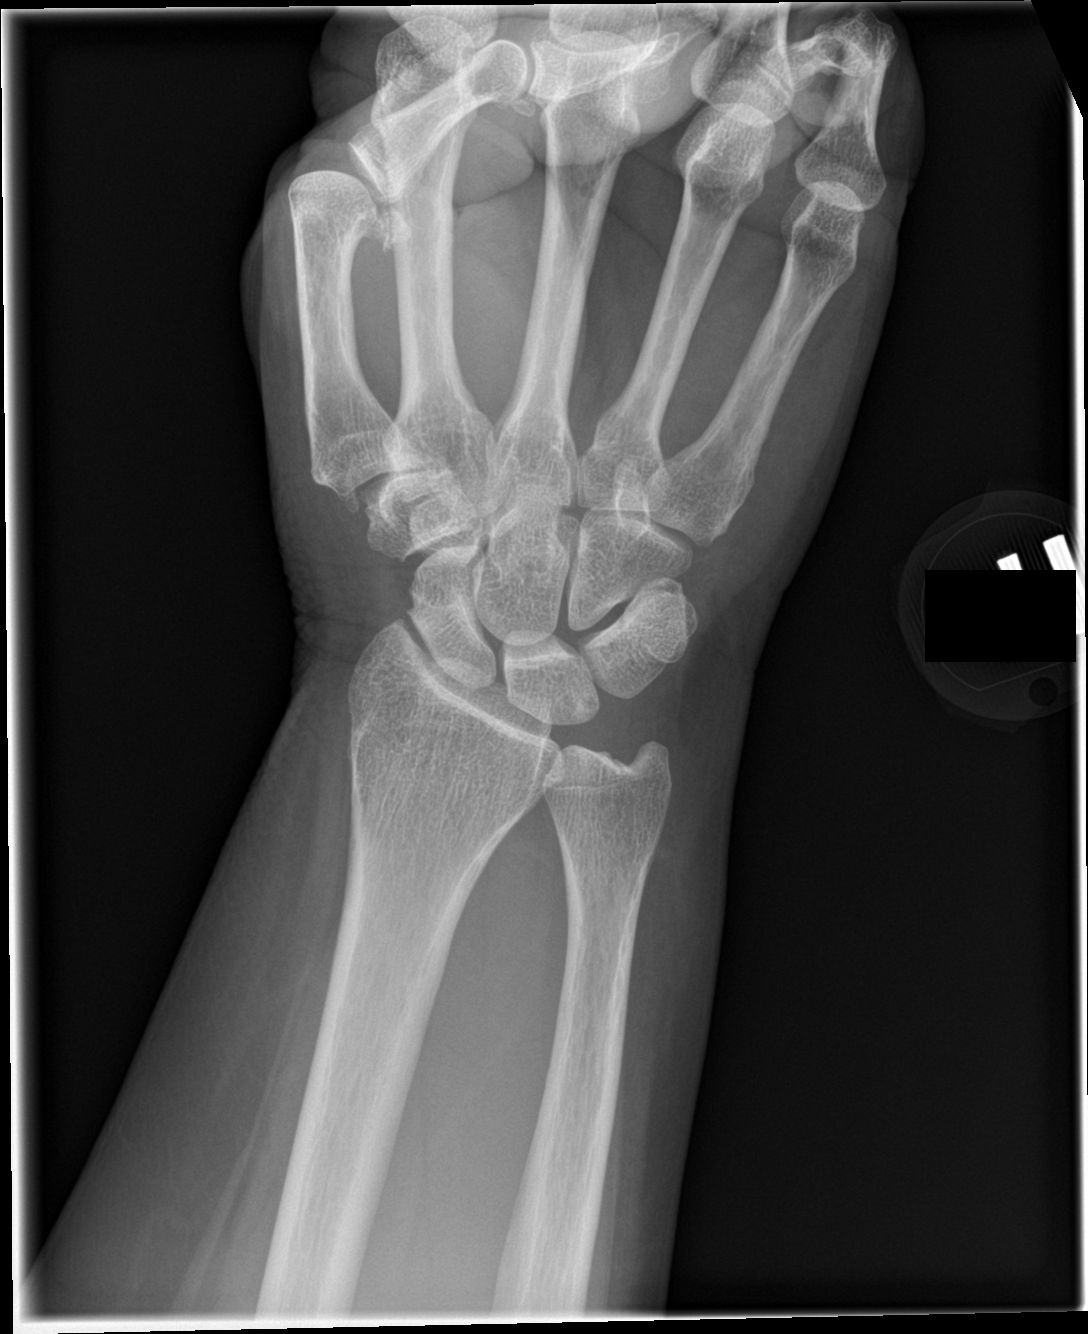

[wrist obl]
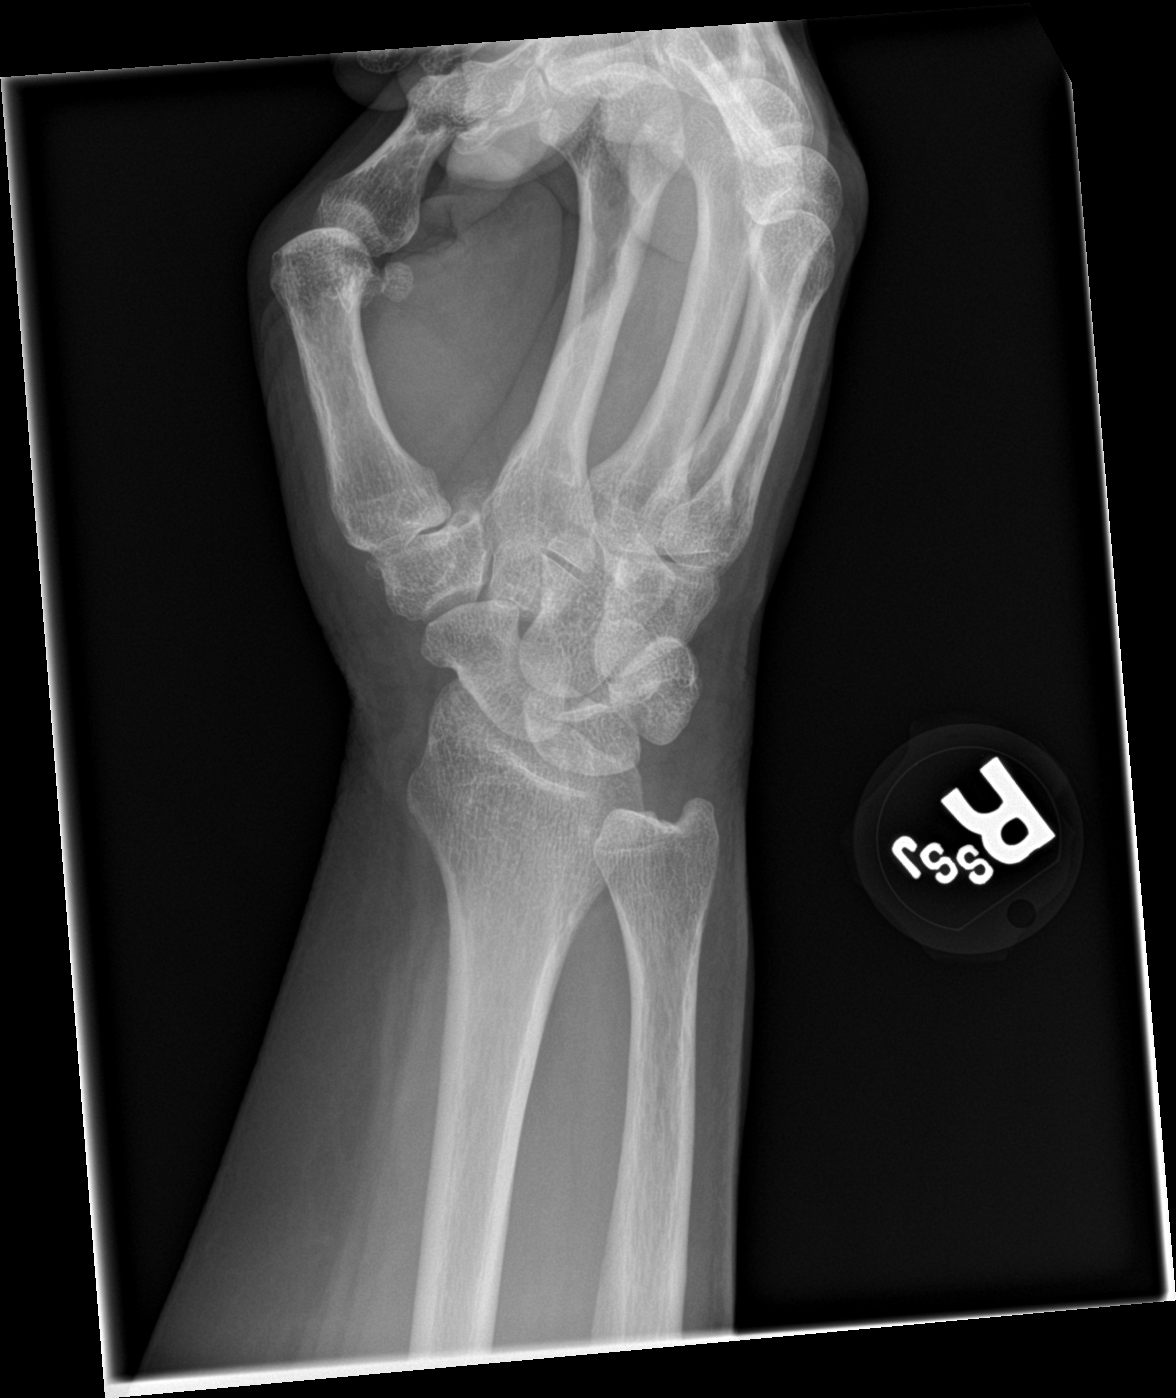

[wrist lat]
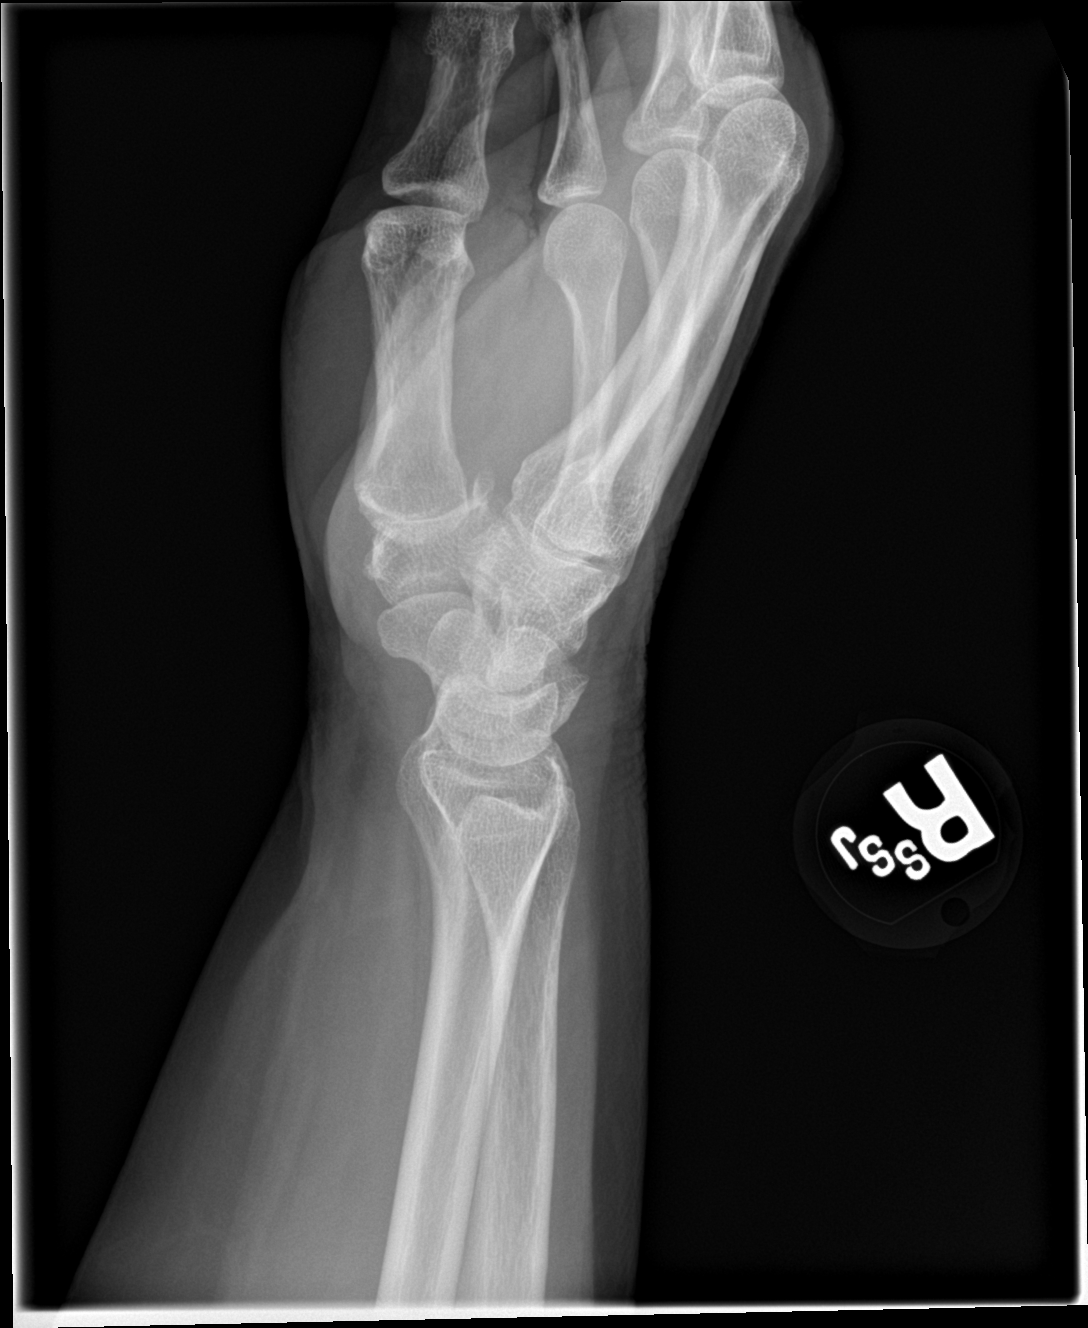

[wrist ap (2 of 2)]
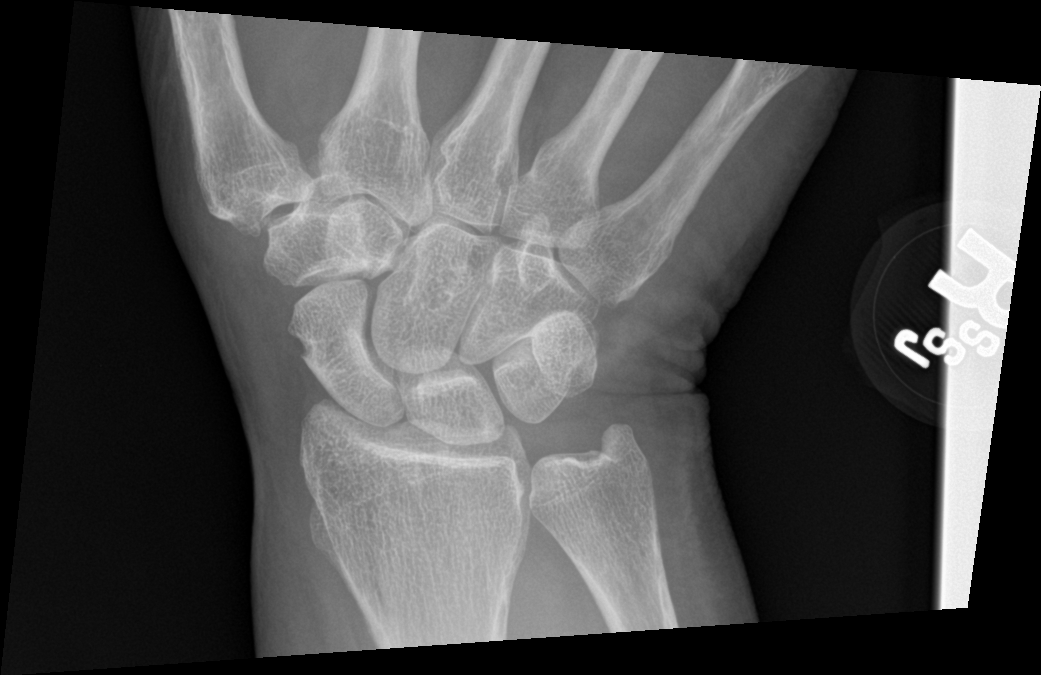

[4 of 4 positions shown; findings below may reference images not displayed]

FINDINGS: No fracture or malalignment. Moderate arthritis at the first CMC
joint. Mild soft tissue swelling is present
IMPRESSION: No acute osseous abnormality.  Arthritis at the first CMC joint

## 2021-04-11 ENCOUNTER — Ambulatory Visit: Payer: Medicare Other | Admitting: Physical Therapy

## 2021-04-18 ENCOUNTER — Telehealth: Payer: Self-pay

## 2021-04-18 ENCOUNTER — Ambulatory Visit (INDEPENDENT_AMBULATORY_CARE_PROVIDER_SITE_OTHER): Payer: Medicare Other | Admitting: Surgery

## 2021-04-18 ENCOUNTER — Encounter: Payer: Self-pay | Admitting: Surgery

## 2021-04-18 ENCOUNTER — Ambulatory Visit: Payer: Medicare Other | Admitting: Physical Therapy

## 2021-04-18 ENCOUNTER — Other Ambulatory Visit: Payer: Self-pay

## 2021-04-18 VITALS — BP 122/83 | HR 71 | Temp 98.4°F | Ht 63.0 in | Wt 224.8 lb

## 2021-04-18 DIAGNOSIS — K432 Incisional hernia without obstruction or gangrene: Secondary | ICD-10-CM

## 2021-04-18 MED ORDER — BUPROPION HCL ER (SR) 100 MG PO TB12: 100.0000 mg | ORAL_TABLET | Freq: Two times a day (BID) | ORAL | 2 refills | Status: DC

## 2021-04-18 MED ORDER — VARENICLINE TARTRATE 0.5 MG PO TABS: 0.5000 mg | ORAL_TABLET | Freq: Two times a day (BID) | ORAL | 2 refills | Status: DC

## 2021-04-18 NOTE — Patient Instructions (Signed)
Pick up your medication at the pharmacy. Please try to stop smoking completely. Please see your follow up appointment listed below.

## 2021-04-18 NOTE — Telephone Encounter (Signed)
Vascular and Anticoagulant clearance faxed to Dr.Jason Dew. Patient taking Eliquis and Aspirin.

## 2021-04-18 NOTE — Progress Notes (Signed)
Surgical Consultation  04/18/2021  Carla [name] is an 60 y.o. female.   Chief Complaint  Patient presents with   Follow-up    Discuss ventral hernia     HPI: Recurrent ventral hernia with loss of domain on a patient with morbid obesity, active smoking, peripheral vascular disease.  Recent revascularization is currently on anticoagulation and antiplatelet therapy.  She does have chronic pain. n.  I also discussed with her that this will be by only refill for Lyrica and she needs to seek PCP advice regarding this.   She continues to have pain at the recurrent ventral hernia site. She is interested in smoking cessation and I will prescribe Chantix and Wellbutrin  Past Medical History:  Diagnosis Date   Abdominal aortic atherosclerosis ([place])    a. 05/2017 CTA abd/pelvis: significant atherosclerotic dzs of the infrarenal abd Ao w/ some mural thrombus. No aneurysm or dissection.   Abdominal pain    Acute focal ischemia of small intestine (HCC)    Acute right-sided low back pain with right-sided sciatica 06/13/2017   AKI (acute kidney injury) ([place]) 07/29/2017   Baker's cyst of knee, right    a. 07/2016 U/S: 4.1 x 1.4 x 2.9 cystic structure in R poplitetal fossa.   Bell's palsy    Borderline diabetes    Cancer ([place])    skin cancer on nose   Chest pain    a. 08/2012 Lexiscan MV: EF 54%, non ischemia/infarct.   CHF (congestive heart failure) (HCC)    Chronic idiopathic constipation    Chronic mesenteric ischemia (HCC)    COPD (chronic obstructive pulmonary disease) (HCC)    Diastolic dysfunction    a. 05/2017 Echo: Ef 60-65%, no rwma, Gr1 DD, no source of cardiac emboli.   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 05/2017 CTA Abd/pelvis: apparent thrombus or embolus in prox SMA (70-90%); b. 05/2017 catheter directed tPA, mechanical thrombectomy, and stenting of the SMA.   Essential hypertension with goal blood pressure less than 140/90 01/19/2016   Gastroesophageal reflux disease  01/19/2016   GERD (gastroesophageal reflux disease)    H/O colectomy    History of kidney stones    Hyperlipidemia    Hypertension    Hypotension 07/29/2017   Morbid obesity with BMI of 40.0-44.9, adult ([place])    Occlusive mesenteric ischemia ([place]) 06/19/2017   Personal history of other malignant neoplasm of skin 06/01/2016   Pre-diabetes    Primary osteoarthritis of both knees 06/13/2017   Recurrent major depressive disorder ([place]) 01/19/2016   SBO (small bowel obstruction) ([place]) 08/06/2017   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HCC)    Urinary tract infection 01/09/2014    Past Surgical History:  Procedure Laterality Date   AMPUTATION TOE Left 12/09/2019   Procedure: AMPUTATION TOE MPJ left;  Surgeon: Caroline More, DPM;  Location: [facility];  Service: Podiatry;  Laterality: Left;   APPENDECTOMY     CHOLECYSTECTOMY     LAPAROTOMY N/A 08/08/2017   Procedure: EXPLORATORY LAPAROTOMY POSSIBLE BOWEL RESECTION;  Surgeon: Jules Husbands, MD;  Location: [facility];  Service: General;  Laterality: N/A;   LOWER EXTREMITY ANGIOGRAPHY Left 11/17/2019   Procedure: LOWER EXTREMITY ANGIOGRAPHY;  Surgeon: Algernon Huxley, MD;  Location: [place] [facility];  Service: Cardiovascular;  Laterality: Left;   LOWER EXTREMITY ANGIOGRAPHY Right 12/13/2020   Procedure: LOWER EXTREMITY ANGIOGRAPHY;  Surgeon: Algernon Huxley, MD;  Location: [place] [facility];  Service: Cardiovascular;  Laterality: Right;   LOWER EXTREMITY  ANGIOGRAPHY Right 01/10/2021   Procedure: LOWER EXTREMITY ANGIOGRAPHY;  Surgeon: Algernon Huxley, MD;  Location: [place] [facility];  Service: Cardiovascular;  Laterality: Right;   LOWER EXTREMITY ANGIOGRAPHY Right 01/24/2021   Procedure: LOWER EXTREMITY ANGIOGRAPHY;  Surgeon: Algernon Huxley, MD;  Location: [place] [facility];  Service: Cardiovascular;  Laterality: Right;   LOWER EXTREMITY INTERVENTION N/A 11/19/2019   Procedure: LOWER EXTREMITY INTERVENTION;  Surgeon: Algernon Huxley, MD;  Location: [place] [facility];  Service: Cardiovascular;  Laterality: N/A;   TEE WITHOUT CARDIOVERSION N/A 06/22/2017   Procedure: TRANSESOPHAGEAL ECHOCARDIOGRAM (TEE);  Surgeon: Wellington Hampshire, MD;  Location: [facility];  Service: Cardiovascular;  Laterality: N/A;   TOTAL KNEE ARTHROPLASTY Right 07/11/2018   Procedure: TOTAL KNEE ARTHROPLASTY;  Surgeon: Corky Mull, MD;  Location: [facility];  Service: Orthopedics;  Laterality: Right;   VAGINAL HYSTERECTOMY     VISCERAL ARTERY INTERVENTION N/A 06/20/2017   Procedure: Visceral Artery Intervention, possible aortic thrombectomy;  Surgeon: Algernon Huxley, MD;  Location: [place] [facility];  Service: Cardiovascular;  Laterality: N/A;   VISCERAL ARTERY INTERVENTION N/A 01/28/2018   Procedure: VISCERAL ARTERY INTERVENTION;  Surgeon: Algernon Huxley, MD;  Location: [place] [facility];  Service: Cardiovascular;  Laterality: N/A;    Family History  Problem Relation Age of Onset   Hypertension Mother    Heart disease Mother    Heart attack Mother    Breast cancer Mother    Hypertension Father    Breast cancer Paternal Aunt     Social History:  reports that she has been smoking cigarettes. She has been smoking an average of 0.10 packs per day. She has never used smokeless tobacco. She reports that she does not drink alcohol and does not use drugs.  Allergies:  Allergies  Allergen Reactions   Gabapentin     Tremors    Bactrim [Sulfamethoxazole-Trimethoprim] Itching    Medications reviewed.     ROS Full ROS performed and is otherwise negative other than what is stated in the HPI    BP 122/83   Pulse 71   Temp 98.4 F (36.9 C) (Oral)   Ht 5\' 3"  (1.6 m)   Wt 224 lb 12.8 oz (102 kg)   SpO2 95%   BMI 39.82 kg/m   Physical Exam  Physical Exam NAD Morbidly obese Abd: soft, Large ventral hernias with loss of domain  ulcerations have healed .  There is no evidence of exposed bowel there is no evidence of peritonitis.  This hernia is currently  chronically incarcerated Ext: , no cyanosis  Assessment/Plan: 60 year old female with symptomatic large ventral hernia subdomain.  She does have significant peripheral vascular disease and type.).  She continues to smoke.  I have prescribed her Wellbutrin and Chantix in an effort to achieve the smoke cessation. She Does have an appointment with vascular this week and I will check Carla [name] to make sure he wears no other interventions that will be done in the near future.  We will see her in about a month to 6 weeks and at that time smoking timeline for elective ventral hernia repair.  There is no evidence for at least 30 minutes in this encounter including coordination of care and reviewing all records  Caroleen Hamman, MD [place] Surgeon dermatitis

## 2021-04-19 ENCOUNTER — Other Ambulatory Visit (INDEPENDENT_AMBULATORY_CARE_PROVIDER_SITE_OTHER): Payer: Self-pay | Admitting: Vascular Surgery

## 2021-04-19 DIAGNOSIS — Z95820 Peripheral vascular angioplasty status with implants and grafts: Secondary | ICD-10-CM

## 2021-04-20 ENCOUNTER — Ambulatory Visit (INDEPENDENT_AMBULATORY_CARE_PROVIDER_SITE_OTHER): Payer: Medicare Other

## 2021-04-20 ENCOUNTER — Other Ambulatory Visit: Payer: Self-pay

## 2021-04-20 ENCOUNTER — Other Ambulatory Visit
Admission: RE | Admit: 2021-04-20 | Discharge: 2021-04-20 | Disposition: A | Discharge status: Home / Self Care | Payer: Medicare Other | Source: Ambulatory Visit | Attending: Internal Medicine | Admitting: Internal Medicine

## 2021-04-20 DIAGNOSIS — I480 Paroxysmal atrial fibrillation: Secondary | ICD-10-CM | POA: Diagnosis present

## 2021-04-20 DIAGNOSIS — Z95820 Peripheral vascular angioplasty status with implants and grafts: Secondary | ICD-10-CM | POA: Diagnosis not present

## 2021-04-20 DIAGNOSIS — I700 Atherosclerosis of aorta: Secondary | ICD-10-CM | POA: Insufficient documentation

## 2021-04-20 DIAGNOSIS — R0789 Other chest pain: Secondary | ICD-10-CM | POA: Diagnosis present

## 2021-04-20 DIAGNOSIS — I10 Essential (primary) hypertension: Secondary | ICD-10-CM | POA: Diagnosis present

## 2021-04-20 DIAGNOSIS — J449 Chronic obstructive pulmonary disease, unspecified: Secondary | ICD-10-CM | POA: Diagnosis present

## 2021-04-20 DIAGNOSIS — Z01818 Encounter for other preprocedural examination: Secondary | ICD-10-CM | POA: Diagnosis present

## 2021-04-20 DIAGNOSIS — Z79899 Other long term (current) drug therapy: Secondary | ICD-10-CM | POA: Insufficient documentation

## 2021-04-20 LAB — BRAIN NATRIURETIC PEPTIDE: B Natriuretic Peptide: 45.4 pg/mL (ref 0.0–100.0)

## 2021-04-22 IMAGING — US US EXTREM LOW VENOUS*L*
1 series · 13 of 24 positions shown · non-contrast
Comparison: 02/14/2015

CLINICAL DATA: Left lower extremity pain.  Cold left foot and calf.



[Series 1: us extrem low venous*left* · 13 of 34 slices shown]
[im 1/34]
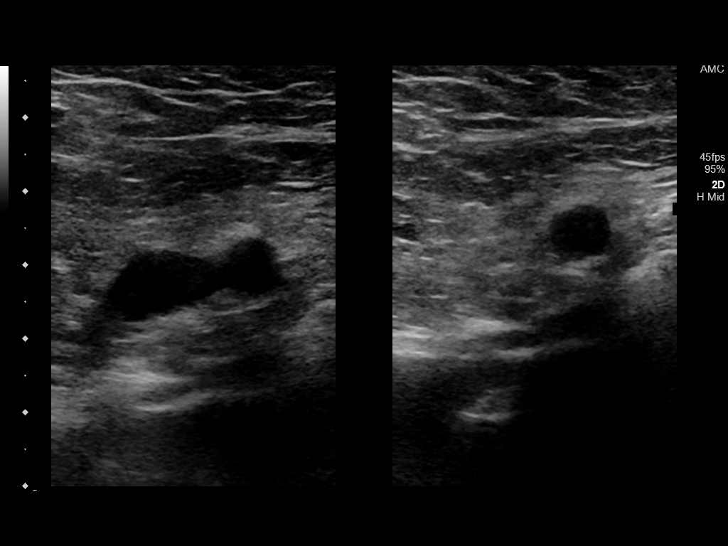
[im 3/34]
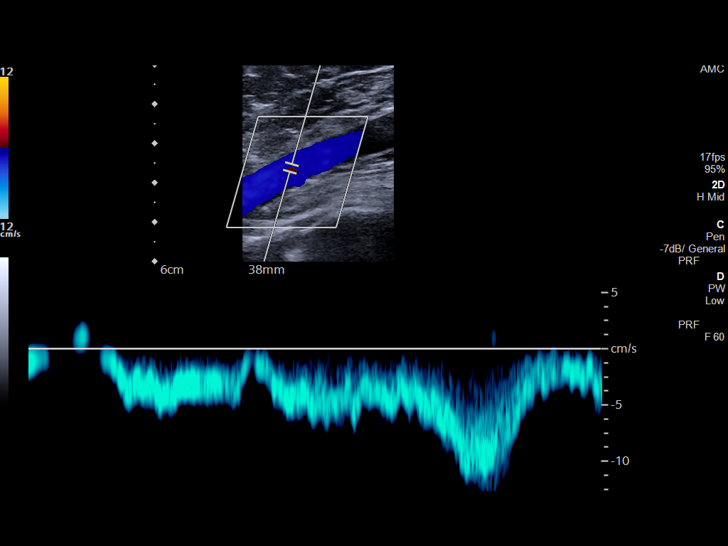
[im 6/34]
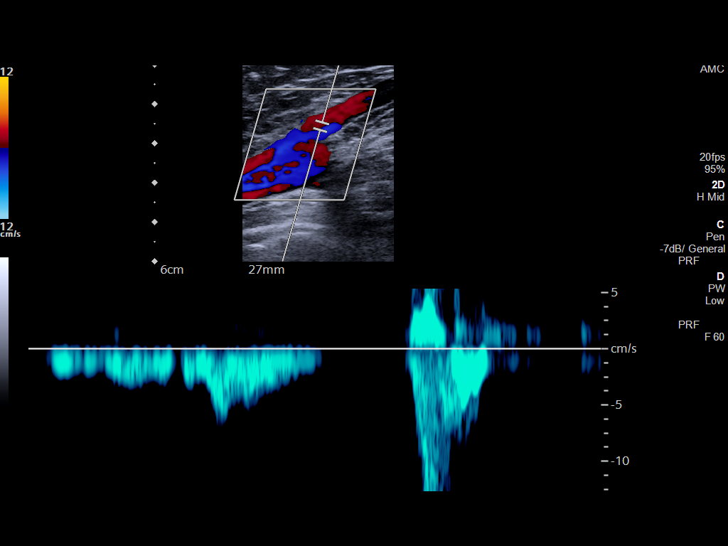
[im 9/34]
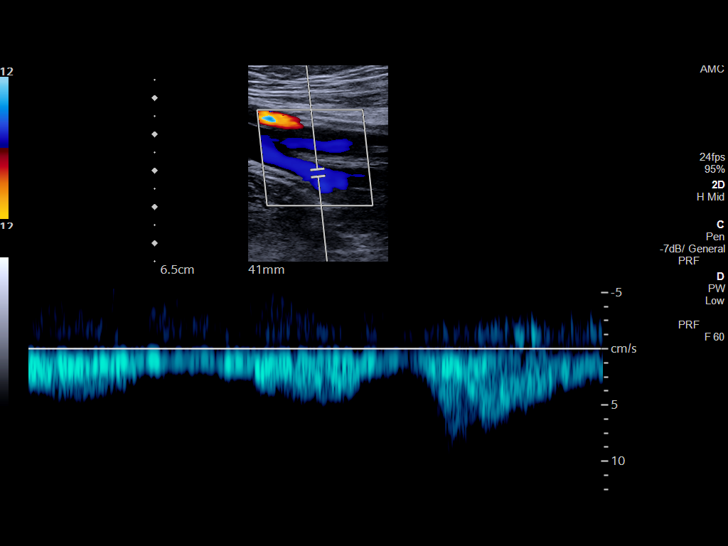
[im 12/34]
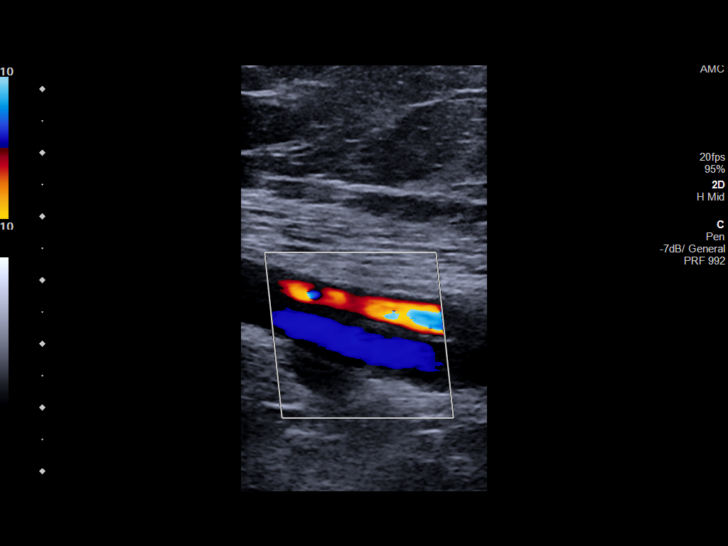
[im 15/34]
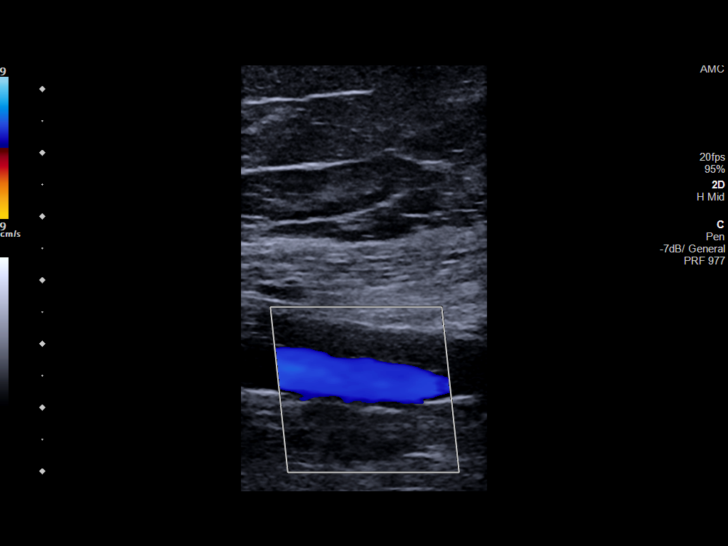
[im 18/34]
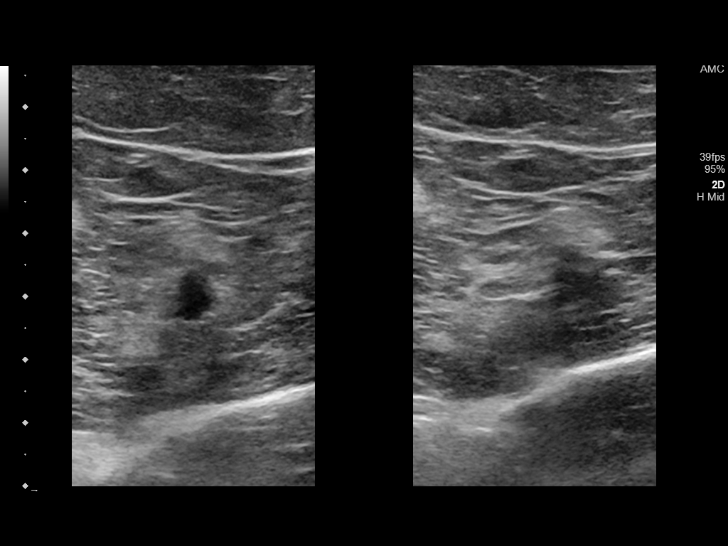
[im 19/34]
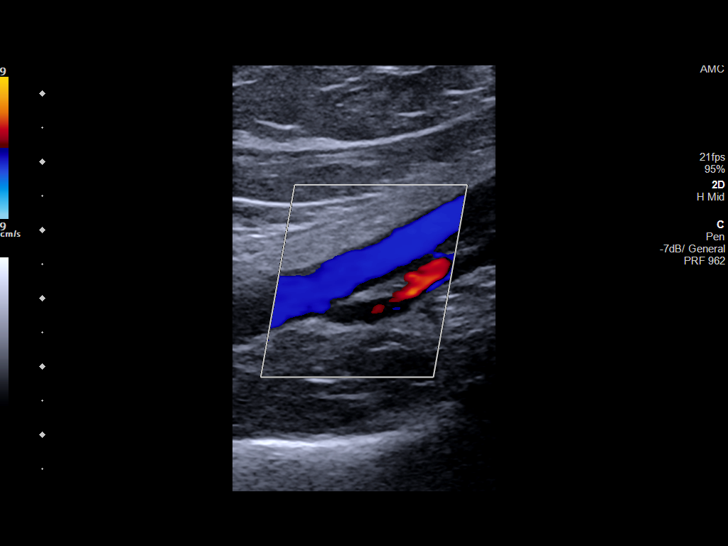
[im 22/34]
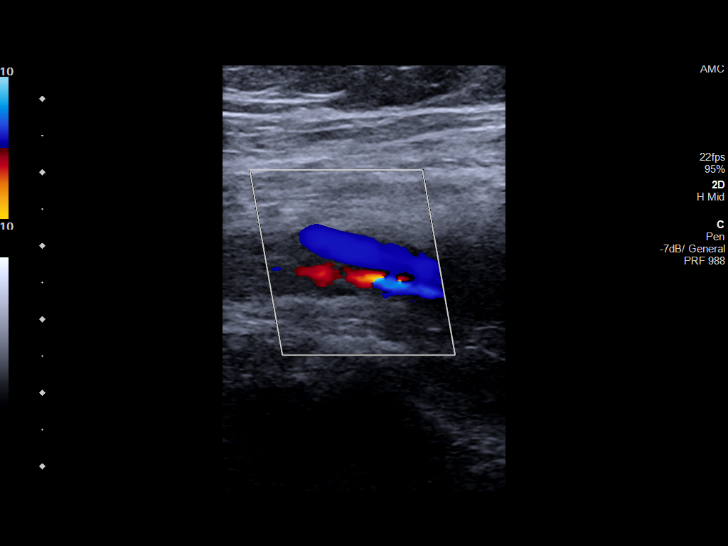
[im 25/34]
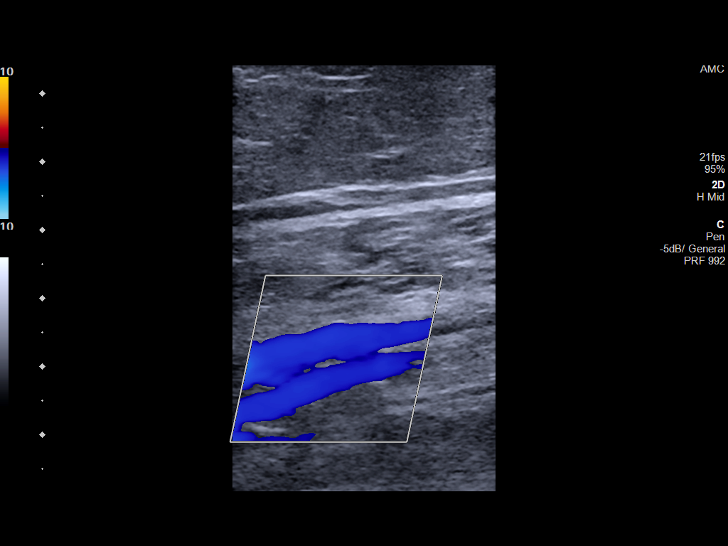
[im 28/34]
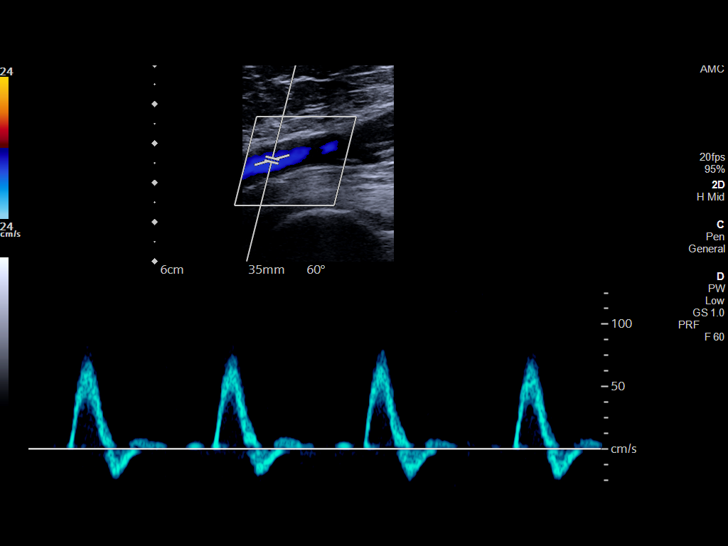
[im 31/34]
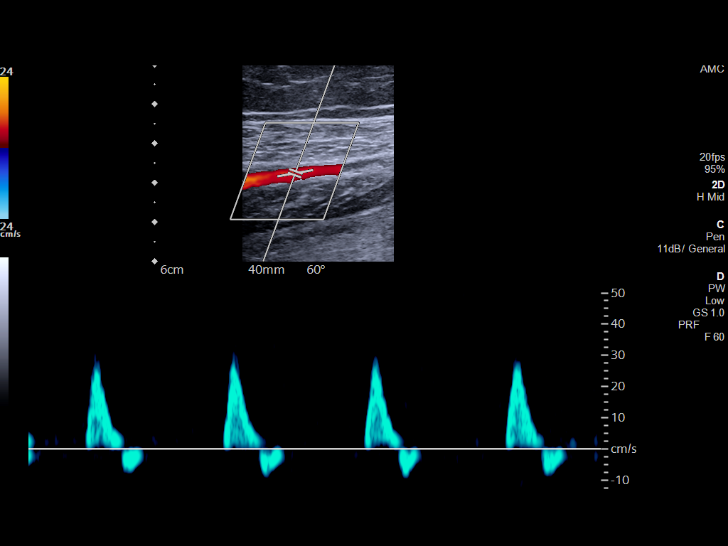
[im 34/34]
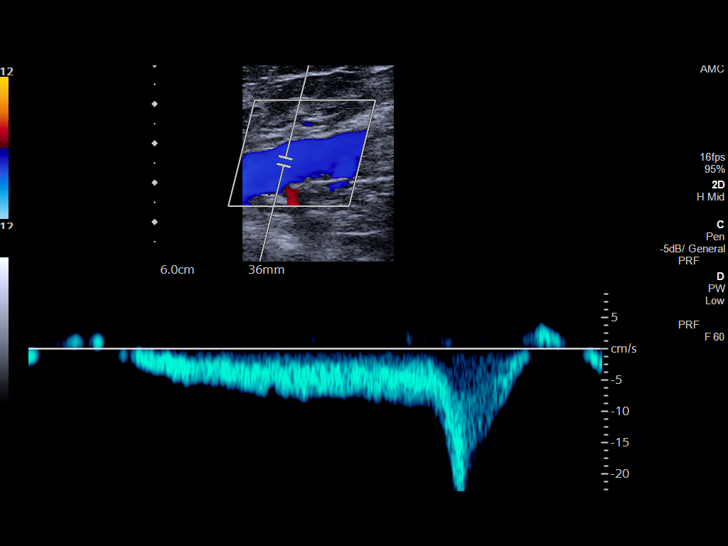

[13 of 24 positions shown; findings below may reference images not displayed]

FINDINGS: Contralateral Common Femoral Vein: Respiratory phasicity is normal
and symmetric with the symptomatic side. No evidence of thrombus.
Normal compressibility.

Common Femoral Vein: No evidence of thrombus. Normal
compressibility, respiratory phasicity and response to augmentation.

Saphenofemoral Junction: No evidence of thrombus. Normal
compressibility and flow on color Doppler imaging.

Profunda Femoral Vein: No evidence of thrombus. Normal
compressibility and flow on color Doppler imaging.

Femoral Vein: No evidence of thrombus. Normal compressibility,
respiratory phasicity and response to augmentation.

Popliteal Vein: No evidence of thrombus. Normal compressibility,
respiratory phasicity and response to augmentation.

Calf Veins: No evidence of thrombus. Normal compressibility and flow
on color Doppler imaging.

Other Findings: Left common femoral artery, left popliteal artery,
left posterior tibial artery and left anterior tibial artery are
patent with normal Doppler waveforms.
IMPRESSION: 1.  Negative for deep venous thrombosis in left lower extremity.
2. Visualized left lower extremity arteries are patent with normal
Doppler waveforms.

## 2021-04-22 IMAGING — DX DG FOOT COMPLETE 3+V*L*
3 series · 3 of 3 positions shown · non-contrast
Comparison: 06/24/2008

CLINICAL DATA: Pain at the second-fourth toes

EXAM:
LEFT FOOT - COMPLETE 3+ VIEW

[foot ap]
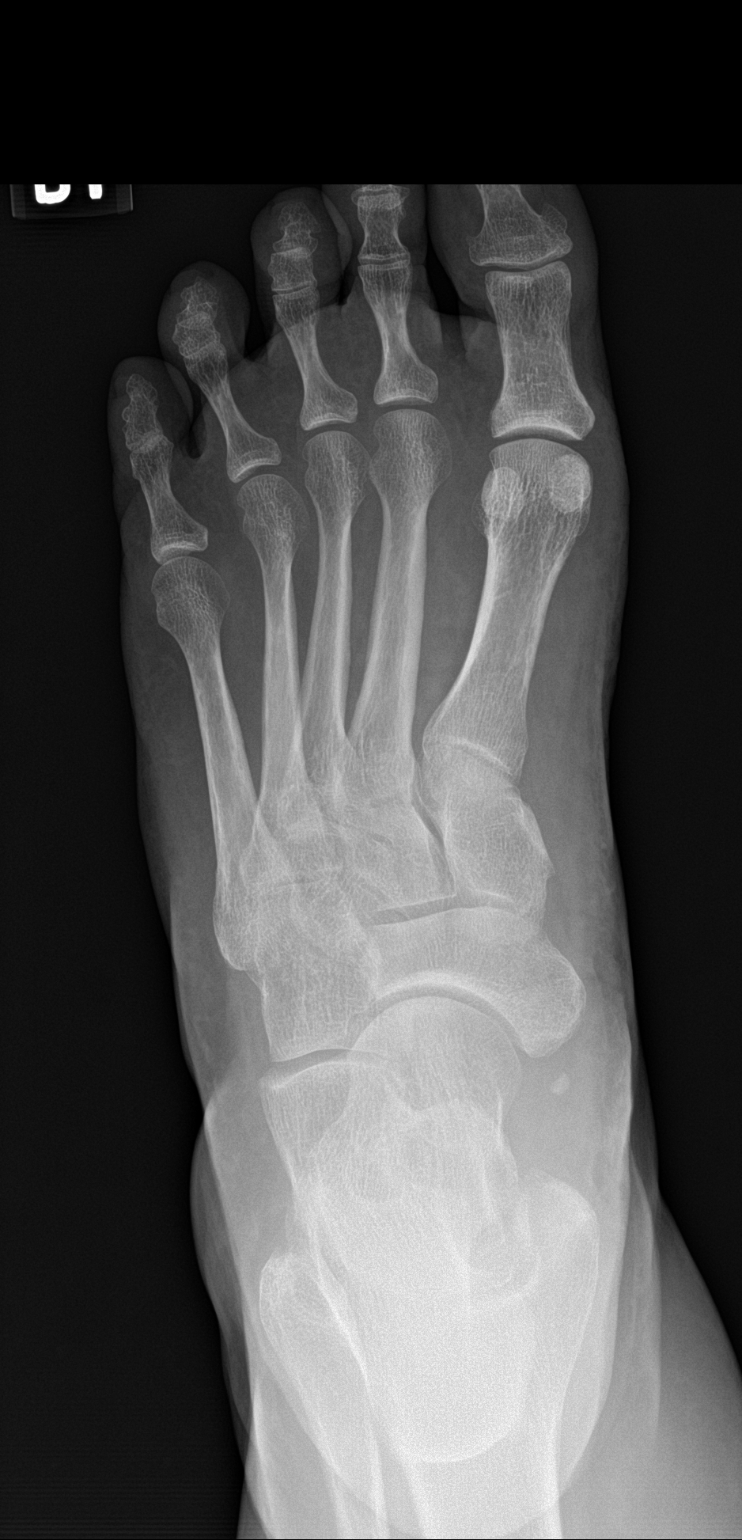

[foot obl]
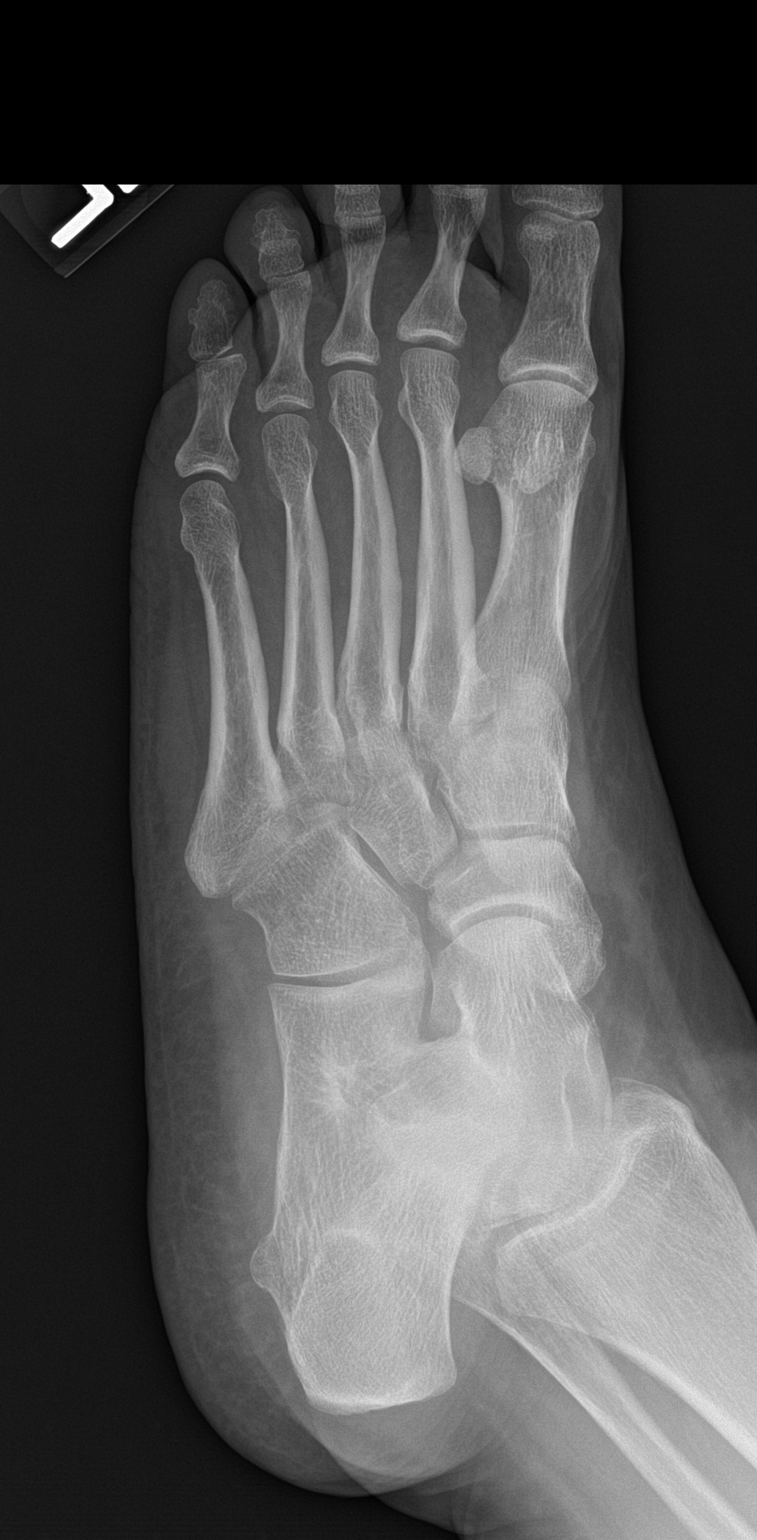

[foot lat]
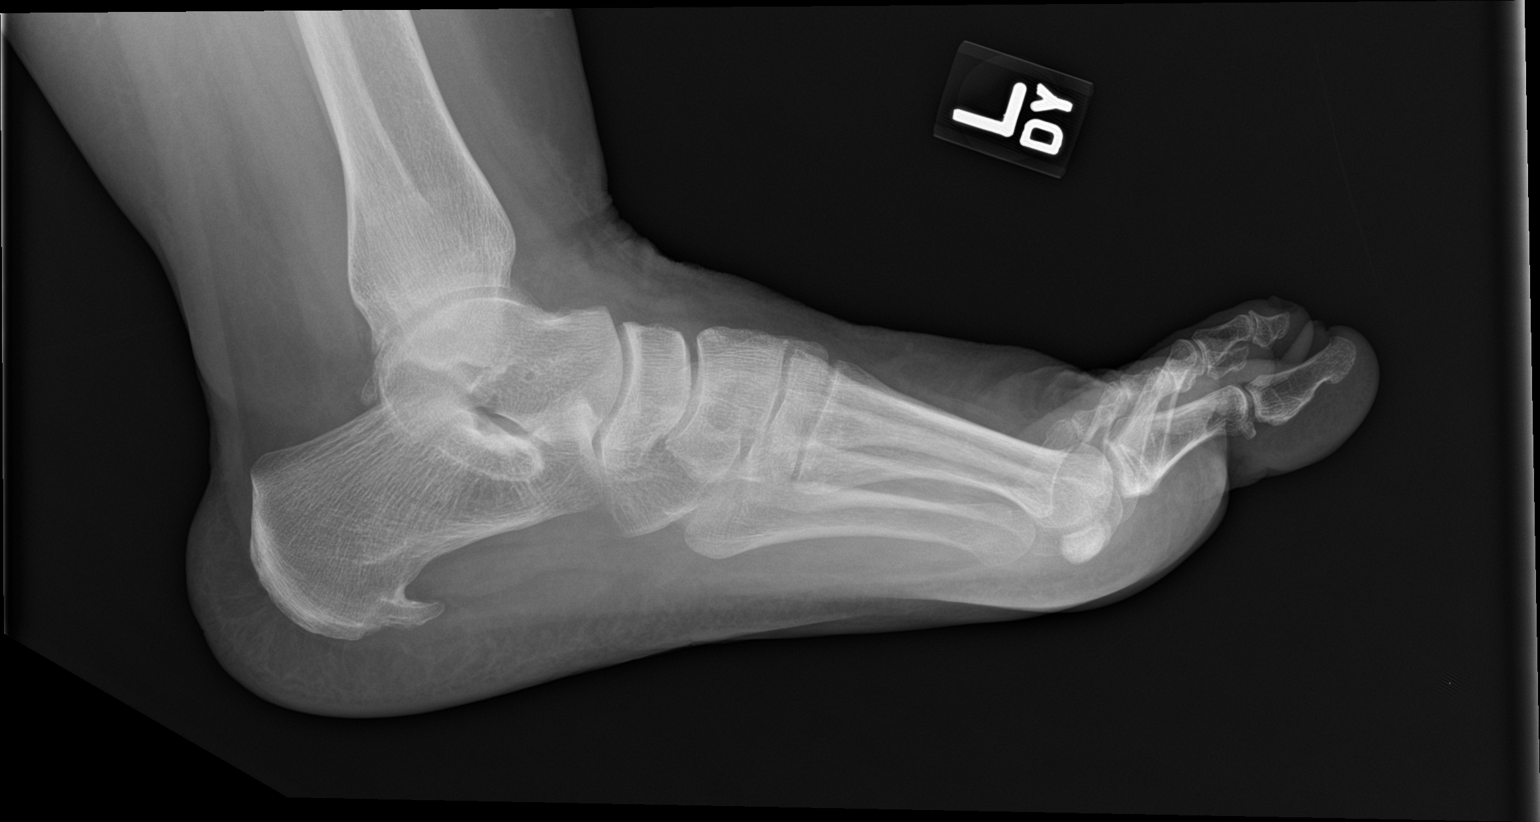

[3 of 3 positions shown; findings below may reference images not displayed]

FINDINGS: There is no evidence of fracture or dislocation. Normal bony
mineralization. Small os navicular. No cortical destruction or
periostitis. Prominent plantar calcaneal spur. There is no evidence
of arthropathy or other focal bone abnormality. Soft tissues are
unremarkable.
IMPRESSION: No acute osseous abnormality of the left foot.

## 2021-05-03 ENCOUNTER — Ambulatory Visit (INDEPENDENT_AMBULATORY_CARE_PROVIDER_SITE_OTHER): Payer: Medicare Other | Admitting: Vascular Surgery

## 2021-05-03 ENCOUNTER — Other Ambulatory Visit: Payer: Self-pay

## 2021-05-03 VITALS — BP 123/79 | HR 70 | Ht 62.0 in | Wt 226.0 lb

## 2021-05-03 DIAGNOSIS — K55069 Acute infarction of intestine, part and extent unspecified: Secondary | ICD-10-CM

## 2021-05-03 DIAGNOSIS — K436 Other and unspecified ventral hernia with obstruction, without gangrene: Secondary | ICD-10-CM

## 2021-05-03 DIAGNOSIS — I998 Other disorder of circulatory system: Secondary | ICD-10-CM | POA: Diagnosis not present

## 2021-05-03 DIAGNOSIS — R7303 Prediabetes: Secondary | ICD-10-CM

## 2021-05-03 DIAGNOSIS — M792 Neuralgia and neuritis, unspecified: Secondary | ICD-10-CM

## 2021-05-03 DIAGNOSIS — I10 Essential (primary) hypertension: Secondary | ICD-10-CM | POA: Diagnosis not present

## 2021-05-03 DIAGNOSIS — E785 Hyperlipidemia, unspecified: Secondary | ICD-10-CM

## 2021-05-03 DIAGNOSIS — I739 Peripheral vascular disease, unspecified: Secondary | ICD-10-CM | POA: Insufficient documentation

## 2021-05-03 NOTE — Assessment & Plan Note (Signed)
lipid control important in reducing the progression of atherosclerotic disease. Continue statin therapy  

## 2021-05-03 NOTE — Assessment & Plan Note (Signed)
blood pressure control important in reducing the progression of atherosclerotic disease. On appropriate oral medications.  

## 2021-05-03 NOTE — Assessment & Plan Note (Signed)
Status post intervention many years ago.

## 2021-05-03 NOTE — Progress Notes (Signed)
MRN : [id number]  Carla [name] is a 60 y.o. (12/29/1960) female who presents with chief complaint of  Chief Complaint  Patient presents with   Follow-up    Patient wants  to talk to [name] about neuropathy pain . Patient stated pain is unbearable.   [name]  History of Present Illness: Patient returns today in follow up of her PAD and leg pain. She is disabled from foot and neuropathy pain. This is worse on the right leg but both pain. She is interested in nerve blocks for the pain.  Medications have not helped much. Flow was recently checked and was good.  Needs an upcoming hernia repair and gets a heart cath first. Last vascular intervention was about three months ago.   Current Outpatient Medications  Medication Sig Dispense Refill   albuterol (VENTOLIN HFA) 108 (90 Base) MCG/ACT inhaler Inhale 2 puffs into the lungs every 6 (six) hours as needed for wheezing or shortness of breath.     amLODipine (NORVASC) 5 MG tablet Take 1 tablet (5 mg total) by mouth daily. 30 tablet 2   apixaban (ELIQUIS) 5 MG TABS tablet Take 1 tablet (5 mg total) by mouth 2 (two) times daily. 60 tablet 11   aspirin EC 81 MG tablet Take 81 mg by mouth daily. Swallow whole.     atorvastatin (LIPITOR) 80 MG tablet Take 80 mg by mouth at bedtime.     buPROPion (WELLBUTRIN SR) 100 MG 12 hr tablet Take 1 tablet (100 mg total) by mouth 2 (two) times daily. 60 tablet 2   citalopram (CELEXA) 20 MG tablet Take 40 mg by mouth daily.     COMBIVENT RESPIMAT 20-100 MCG/ACT AERS respimat Inhale 1 puff into the lungs 4 (four) times daily.     ferrous sulfate 325 (65 FE) MG tablet Take 325 mg by mouth daily.     furosemide (LASIX) 40 MG tablet Take by mouth.     metoprolol tartrate (LOPRESSOR) 50 MG tablet Take 1 tablet (50 mg total) by mouth 2 (two) times daily. 30 tablet 0   omeprazole (PRILOSEC) 20 MG capsule Take 20 mg by mouth 2 (two) times daily before a meal.     pregabalin (LYRICA) 100 MG capsule Take 100 mg by mouth 3 (three)  times daily.     pregabalin (LYRICA) 200 MG capsule Take 1 capsule (200 mg total) by mouth 2 (two) times daily. 90 capsule 0   varenicline (CHANTIX) 0.5 MG tablet Take 1 tablet (0.5 mg total) by mouth 2 (two) times daily. 60 tablet 2   varenicline (CHANTIX) 0.5 MG tablet Take 1 tablet by mouth 2 (two) times daily.     No current facility-administered medications for this visit.    Past Medical History:  Diagnosis Date   Abdominal aortic atherosclerosis ([place])    a. 05/2017 CTA abd/pelvis: significant atherosclerotic dzs of the infrarenal abd Ao w/ some mural thrombus. No aneurysm or dissection.   Abdominal pain    Acute focal ischemia of small intestine (HCC)    Acute right-sided low back pain with right-sided sciatica 06/13/2017   AKI (acute kidney injury) ([place]) 07/29/2017   Baker's cyst of knee, right    a. 07/2016 U/S: 4.1 x 1.4 x 2.9 cystic structure in R poplitetal fossa.   Bell's palsy    Borderline diabetes    Cancer ([place])    skin cancer on nose   Chest pain    a. 08/2012 Lexiscan MV: EF 54%, non ischemia/infarct.  CHF (congestive heart failure) (HCC)    Chronic idiopathic constipation    Chronic mesenteric ischemia (HCC)    COPD (chronic obstructive pulmonary disease) (HCC)    Diastolic dysfunction    a. 05/2017 Echo: Ef 60-65%, no rwma, Gr1 DD, no source of cardiac emboli.   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 05/2017 CTA Abd/pelvis: apparent thrombus or embolus in prox SMA (70-90%); b. 05/2017 catheter directed tPA, mechanical thrombectomy, and stenting of the SMA.   Essential hypertension with goal blood pressure less than 140/90 01/19/2016   Gastroesophageal reflux disease 01/19/2016   GERD (gastroesophageal reflux disease)    H/O colectomy    History of kidney stones    Hyperlipidemia    Hypertension    Hypotension 07/29/2017   Morbid obesity with BMI of 40.0-44.9, adult ([place])    Occlusive mesenteric ischemia ([place]) 06/19/2017   Personal history of other  malignant neoplasm of skin 06/01/2016   Pre-diabetes    Primary osteoarthritis of both knees 06/13/2017   Recurrent major depressive disorder ([place]) 01/19/2016   SBO (small bowel obstruction) ([place]) 08/06/2017   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HCC)    Urinary tract infection 01/09/2014    Past Surgical History:  Procedure Laterality Date   AMPUTATION TOE Left 12/09/2019   Procedure: AMPUTATION TOE MPJ left;  Surgeon: Caroline More, DPM;  Location: [facility];  Service: Podiatry;  Laterality: Left;   APPENDECTOMY     CHOLECYSTECTOMY     LAPAROTOMY N/A 08/08/2017   Procedure: EXPLORATORY LAPAROTOMY POSSIBLE BOWEL RESECTION;  Surgeon: Jules Husbands, MD;  Location: [facility];  Service: General;  Laterality: N/A;   LOWER EXTREMITY ANGIOGRAPHY Left 11/17/2019   Procedure: LOWER EXTREMITY ANGIOGRAPHY;  Surgeon: Algernon Huxley, MD;  Location: [place];  Service: Cardiovascular;  Laterality: Left;   LOWER EXTREMITY ANGIOGRAPHY Right 12/13/2020   Procedure: LOWER EXTREMITY ANGIOGRAPHY;  Surgeon: Algernon Huxley, MD;  Location: [place] [facility];  Service: Cardiovascular;  Laterality: Right;   LOWER EXTREMITY ANGIOGRAPHY Right 01/10/2021   Procedure: LOWER EXTREMITY ANGIOGRAPHY;  Surgeon: Algernon Huxley, MD;  Location: [place] [facility];  Service: Cardiovascular;  Laterality: Right;   LOWER EXTREMITY ANGIOGRAPHY Right 01/24/2021   Procedure: LOWER EXTREMITY ANGIOGRAPHY;  Surgeon: Algernon Huxley, MD;  Location: [place] [facility];  Service: Cardiovascular;  Laterality: Right;   LOWER EXTREMITY INTERVENTION N/A 11/19/2019   Procedure: LOWER EXTREMITY INTERVENTION;  Surgeon: Algernon Huxley, MD;  Location: [facility];  Service: Cardiovascular;  Laterality: N/A;   TEE WITHOUT CARDIOVERSION N/A 06/22/2017   Procedure: TRANSESOPHAGEAL ECHOCARDIOGRAM (TEE);  Surgeon: Wellington Hampshire, MD;  Location: [facility];  Service: Cardiovascular;  Laterality: N/A;   TOTAL KNEE ARTHROPLASTY Right 07/11/2018    Procedure: TOTAL KNEE ARTHROPLASTY;  Surgeon: Corky Mull, MD;  Location: [facility];  Service: Orthopedics;  Laterality: Right;   VAGINAL HYSTERECTOMY     VISCERAL ARTERY INTERVENTION N/A 06/20/2017   Procedure: Visceral Artery Intervention, possible aortic thrombectomy;  Surgeon: Algernon Huxley, MD;  Location: [place] [facility];  Service: Cardiovascular;  Laterality: N/A;   VISCERAL ARTERY INTERVENTION N/A 01/28/2018   Procedure: VISCERAL ARTERY INTERVENTION;  Surgeon: Algernon Huxley, MD;  Location: [facility];  Service: Cardiovascular;  Laterality: N/A;     Social History   Tobacco Use   Smoking status: Some Days    Packs/day: 0.10    Pack years: 0.00    Types: Cigarettes  Smokeless tobacco: Never  Vaping Use   Vaping Use: Never used  Substance Use Topics   Alcohol use: No   Drug use: No      Family History  Problem Relation Age of Onset   Hypertension Mother    Heart disease Mother    Heart attack Mother    Breast cancer Mother    Hypertension Father    Breast cancer Paternal Aunt      Allergies  Allergen Reactions   Gabapentin     Tremors    Bactrim [Sulfamethoxazole-Trimethoprim] Itching     REVIEW OF SYSTEMS (Negative unless checked)  Constitutional: [] Weight loss  [] Fever  [] Chills Cardiac: [] Chest pain   [] Chest pressure   [] Palpitations   [] Shortness of breath when laying flat   [] Shortness of breath at rest   [] Shortness of breath with exertion. Vascular:  [] Pain in legs with walking   [] Pain in legs at rest   [] Pain in legs when laying flat   [] Claudication   [x] Pain in feet when walking  [x] Pain in feet at rest  [x] Pain in feet when laying flat   [] History of DVT   [] Phlebitis   [] Swelling in legs   [] Varicose veins   [] Non-healing ulcers Pulmonary:   [] Uses home oxygen   [] Productive cough   [] Hemoptysis   [] Wheeze  [] COPD   [] Asthma Neurologic:  [] Dizziness  [] Blackouts   [] Seizures   [] History of stroke   [] History of TIA  [] Aphasia    [] Temporary blindness   [] Dysphagia   [] Weakness or numbness in arms   [x] Weakness or numbness in legs Musculoskeletal:  [] Arthritis   [] Joint swelling   [] Joint pain   [] Low back pain Hematologic:  [] Easy bruising  [] Easy bleeding   [x] Hypercoagulable state   [] Anemic   Gastrointestinal:  [] Blood in stool   [] Vomiting blood  [] Gastroesophageal reflux/heartburn   [] Abdominal pain Genitourinary:  [] Chronic kidney disease   [] Difficult urination  [] Frequent urination  [] Burning with urination   [] Hematuria Skin:  [] Rashes   [] Ulcers   [] Wounds Psychological:  [] History of anxiety   []  History of major depression.  Physical Examination  BP 123/79   Pulse 70   Ht 5\' 2"  (1.575 m)   Wt 226 lb (102.5 kg)   BMI 41.34 kg/m  Gen:  WD/WN, NAD. Obese  Head: [place]/AT, No temporalis wasting. Ear/Nose/Throat: Hearing grossly intact, nares w/o erythema or drainage Eyes: Conjunctiva clear. Sclera non-icteric Neck: Supple.  Trachea midline Pulmonary:  Good air movement, no use of accessory muscles.  Cardiac: RRR, no JVD Vascular:  Vessel Right Left  Radial Palpable Palpable                          PT 1+ Palpable 1+ Palpable  DP 2+ Palpable 2+ Palpable   Gastrointestinal: soft, large ventral hernia with an area of skin breakdown. Musculoskeletal: M/S 5/5 throughout.  No deformity or atrophy. Mild BLE edema. Neurologic: Sensation grossly intact in extremities.  Symmetrical.  Speech is fluent.  Psychiatric: Judgment intact, Mood & affect appropriate for pt's clinical situation. Dermatologic: No rashes or ulcers noted.  No cellulitis or open wounds.      Labs Recent Results (from the past 2160 hour(s))  Brain natriuretic peptide     Status: None   Collection Time: 04/20/21 10:35 AM  Result Value Ref Range   B Natriuretic Peptide 45.4 0.0 - 100.0 pg/mL    Comment: Performed at [facility], [place]  [address], [place], [place] [address]    Radiology ABI WITH/WO TBI  Result Date:  04/22/2021  LOWER EXTREMITY DOPPLER STUDY Patient Name:  DEANN MCLAINE  Date of Exam:   04/20/2021 Medical Rec #: [id number]      Accession #:    [id number] Date of Birth: 1961-07-18       Patient Gender: F Patient Age:   60Y Exam Location:  [facility] Procedure:      VAS [place] ABI WITH/WO TBI Referring Phys: [id number] Erskine Squibb Edye Hainline --------------------------------------------------------------------------------   Vascular Interventions: 12/13/2020 right cia,eia thrombectomy                         01/10/2021 rt thrombectomy new bilat kissing stents in                         cia's, addtl rt eia stent                         01/24/2021 repeat thrombectomy right. Comparison Study: 02/25/2021 Performing Technologist: Charlane Ferretti RT (R)(VS)  Examination Guidelines: A complete evaluation includes at minimum, Doppler waveform signals and systolic blood pressure reading at the level of bilateral brachial, anterior tibial, and posterior tibial arteries, when vessel segments are accessible. Bilateral testing is considered an integral part of a complete examination. Photoelectric Plethysmograph (PPG) waveforms and toe systolic pressure readings are included as required and additional duplex testing as needed. Limited examinations for reoccurring indications may be performed as noted.  ABI Findings: +---------+------------------+-----+--------+--------+ Right    Rt Pressure (mmHg)IndexWaveformComment  +---------+------------------+-----+--------+--------+ Brachial 102                                     +---------+------------------+-----+--------+--------+ ATA      95                0.93 biphasic         +---------+------------------+-----+--------+--------+ PTA      84                0.82 biphasic         +---------+------------------+-----+--------+--------+ Great Toe67                0.66 Dampened         +---------+------------------+-----+--------+--------+  +---------+------------------+-----+----------+-------+ Left     Lt Pressure (mmHg)IndexWaveform  Comment +---------+------------------+-----+----------+-------+ Brachial 100                                      +---------+------------------+-----+----------+-------+ ATA      87                0.85 monophasic        +---------+------------------+-----+----------+-------+ PTA      87                0.85 monophasic        +---------+------------------+-----+----------+-------+ Great Toe48                0.47 Abnormal          +---------+------------------+-----+----------+-------+ +-------+-----------+-----------+------------+------------+ ABI/TBIToday's ABIToday's TBIPrevious ABIPrevious TBI +-------+-----------+-----------+------------+------------+ Right  .93        .66        1.11        .61          +-------+-----------+-----------+------------+------------+  Left   .85        .47        1.0         .46          +-------+-----------+-----------+------------+------------+ Bilateral ABIs appear decreased compared to prior study on 02/25/2021. Compared to prior study on 02/25/2021.  Summary: Right: Resting right ankle-brachial index indicates mild right lower extremity arterial disease. The right toe-brachial index is abnormal. Left: Resting left ankle-brachial index indicates mild left lower extremity arterial disease. The left toe-brachial index is abnormal.  *See table(s) above for measurements and observations.  Electronically signed by [name] MD on 04/22/2021 at 9:29:07 AM.    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  Essential hypertension with goal blood pressure less than 140/90 blood pressure control important in reducing the progression of atherosclerotic disease. On appropriate oral medications.   Prediabetes blood glucose control important in reducing the progression of  atherosclerotic disease. Also, involved in wound healing. On appropriate medications.   Hyperlipidemia lipid control important in reducing the progression of atherosclerotic disease. Continue statin therapy   Ventral hernia with obstruction and without gangrene Have previous discussed her with general surgery who understands she may require a major ventral hernia repair in the future. Would recommend Lovenox bridge with her significant clotting history.  Neuropathic pain Desires nerve blocks.  Referral to pain clinic.  PAD (peripheral artery disease) (HCC) ABIs recently checked and were good.  Continue medical regimen. Three months since last intervention. Recheck in 3 months.    [name], MD  05/03/2021 4:59 PM    This note was created with Dragon medical transcription system.  Any errors from dictation are purely unintentional

## 2021-05-03 NOTE — Assessment & Plan Note (Signed)
Desires nerve blocks.  Referral to pain clinic.

## 2021-05-03 NOTE — Assessment & Plan Note (Signed)
blood glucose control important in reducing the progression of atherosclerotic disease. Also, involved in wound healing. On appropriate medications.  

## 2021-05-03 NOTE — Assessment & Plan Note (Signed)
ABIs recently checked and were good.  Continue medical regimen. Three months since last intervention. Recheck in 3 months.

## 2021-05-03 NOTE — Assessment & Plan Note (Addendum)
Have previous discussed her with general surgery who understands she may require a major ventral hernia repair in the future. Would recommend Lovenox bridge with her significant clotting history.

## 2021-05-08 IMAGING — US US EXTREM LOW VENOUS*L*
1 series · 14 of 24 positions shown · non-contrast
Comparison: None.

CLINICAL DATA: Left leg pain and swelling.

EXAM:
LEFT LOWER EXTREMITY VENOUS DOPPLER ULTRASOUND
TECHNIQUE: Gray-scale sonography with compression, as well as color and duplex
ultrasound, were performed to evaluate the deep venous system(s)
from the level of the common femoral vein through the popliteal and
proximal calf veins.

[Series 1: us extrem low venous*left* · 14 of 36 slices shown]
[im 1/36]
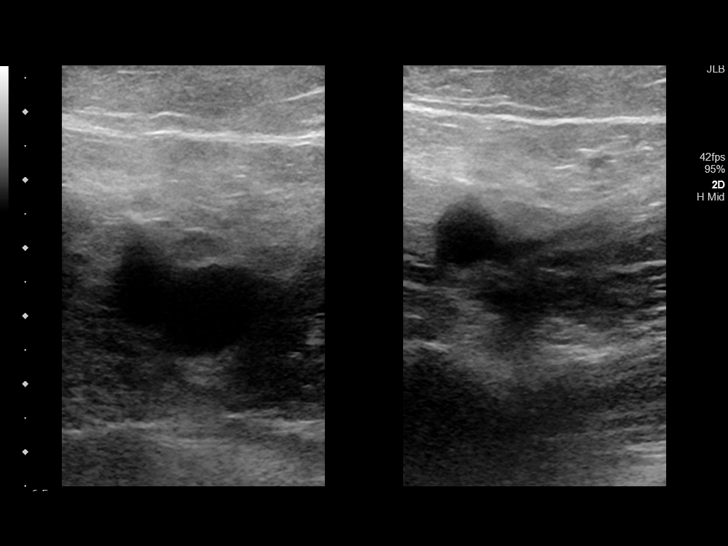
[im 4/36]
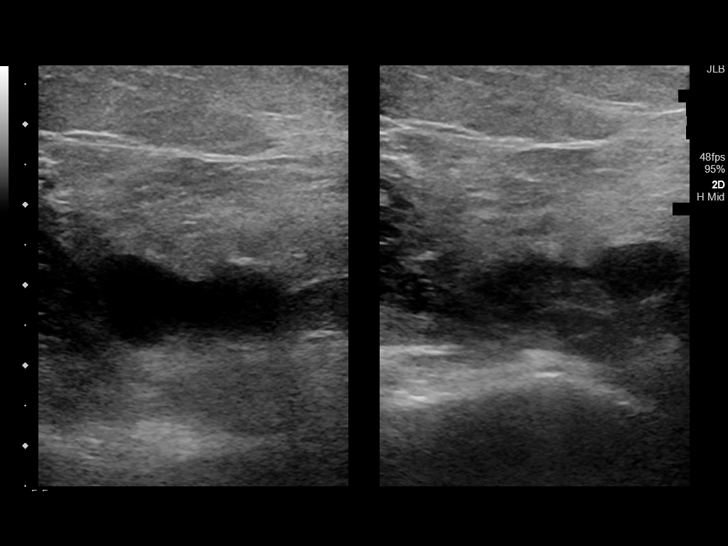
[im 7/36]
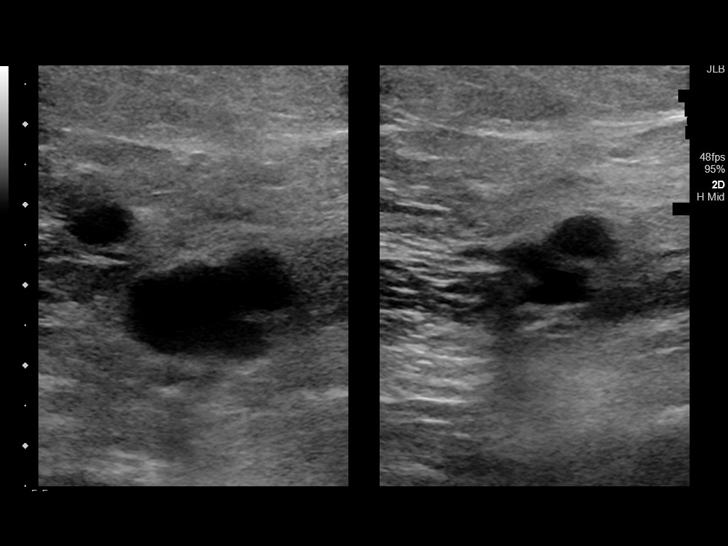
[im 10/36]
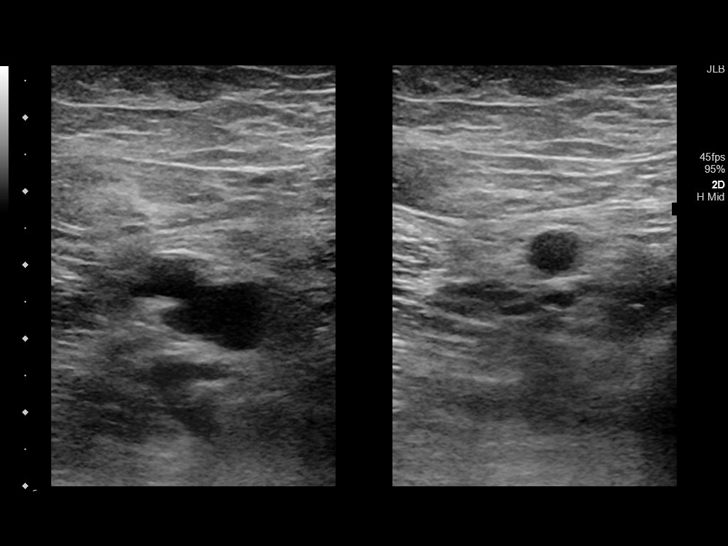
[im 11/36]
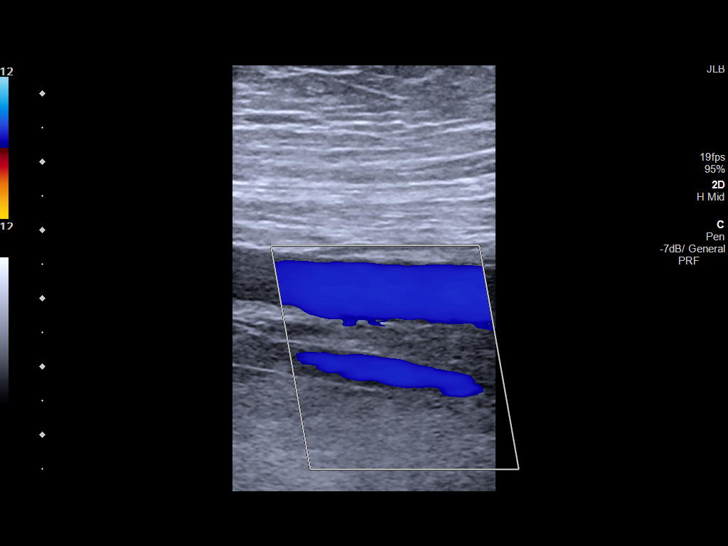
[im 14/36]
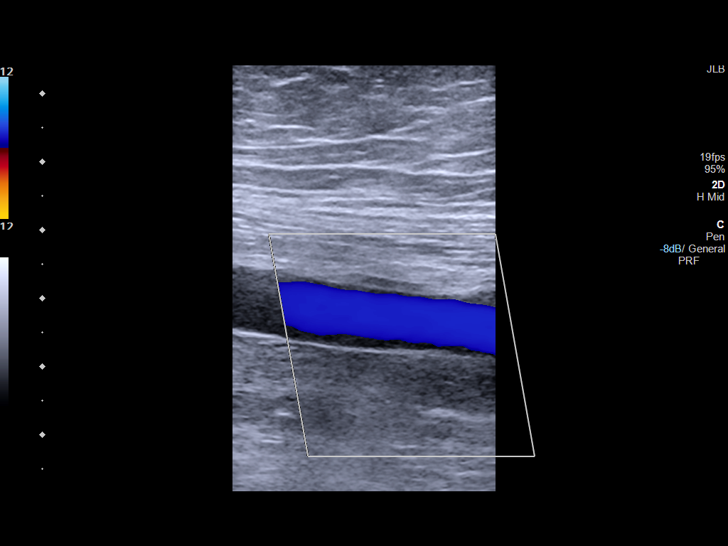
[im 17/36]
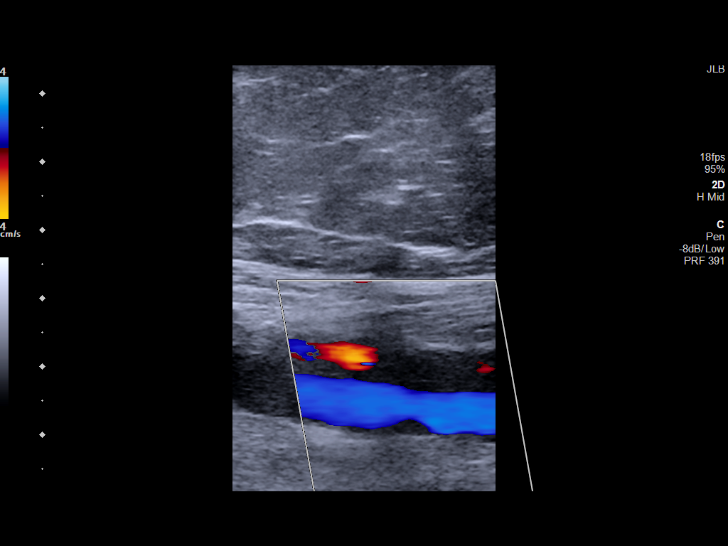
[im 19/36]
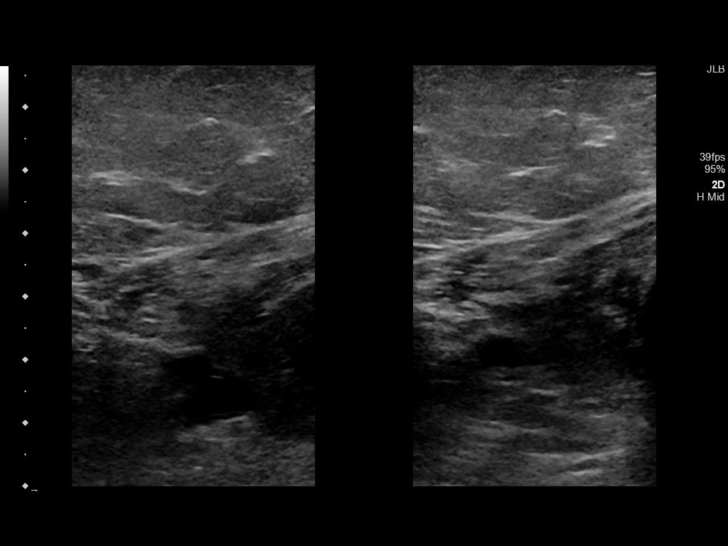
[im 22/36]
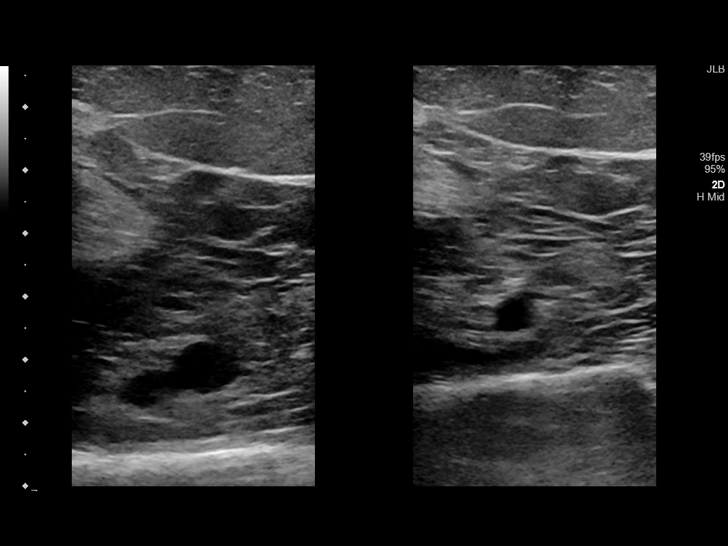
[im 25/36]
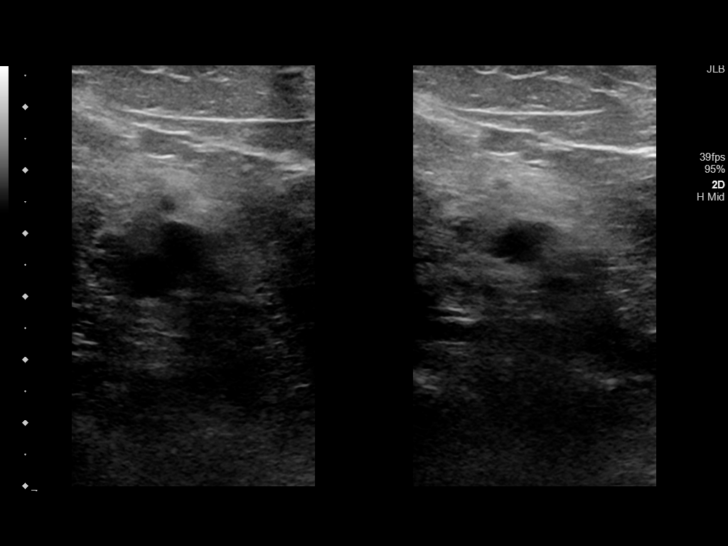
[im 28/36]
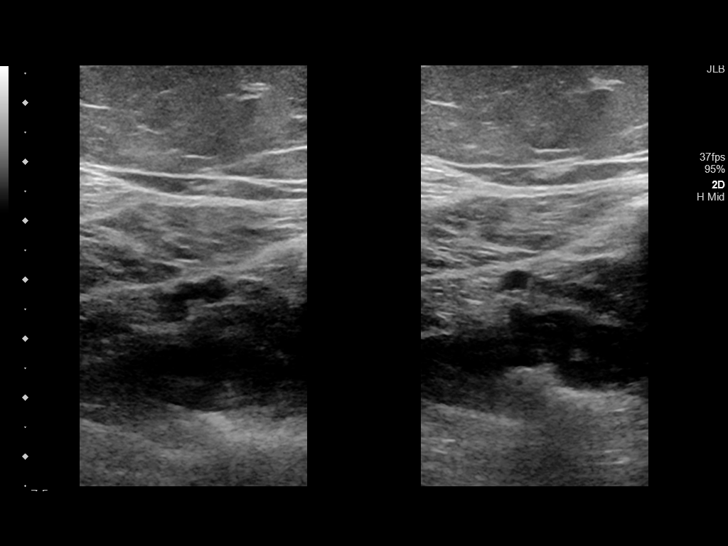
[im 29/36]
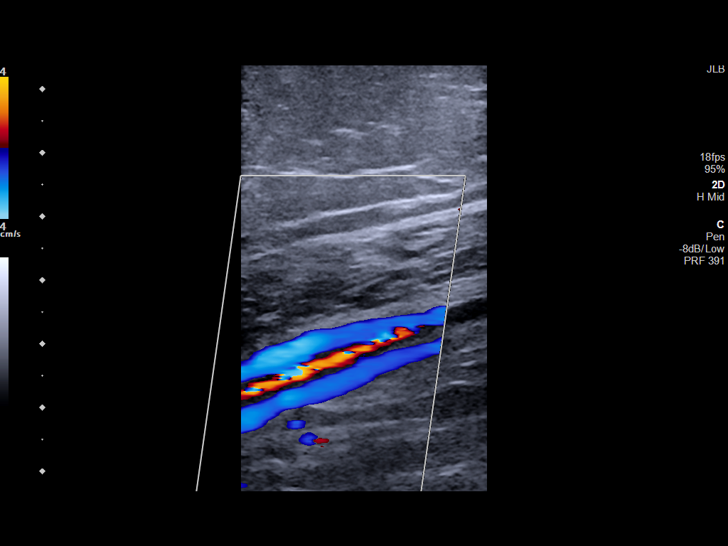
[im 32/36]
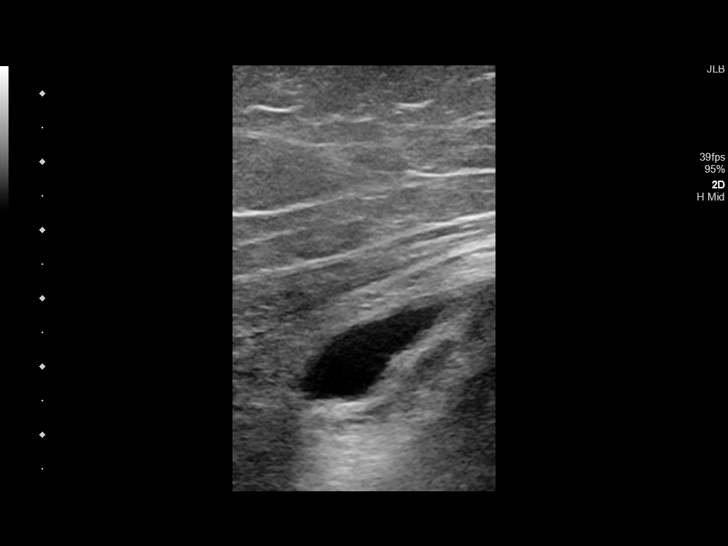
[im 36/36]
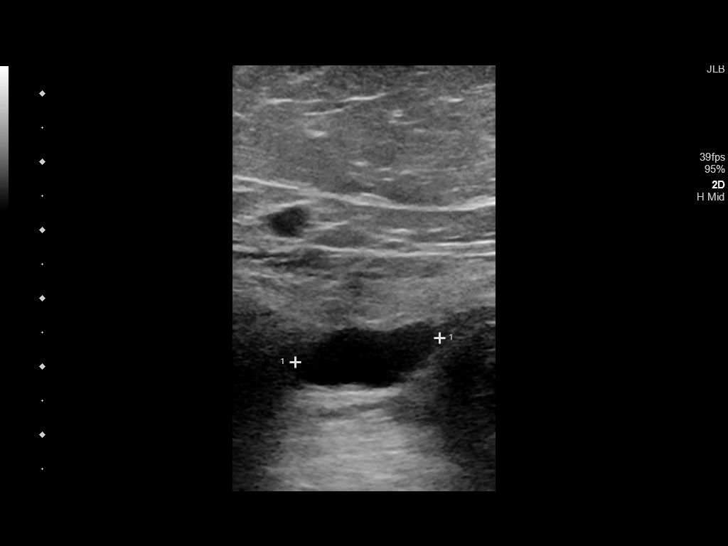

[14 of 24 positions shown; findings below may reference images not displayed]

FINDINGS: VENOUS

Normal compressibility of the common femoral, superficial femoral,
and popliteal veins, as well as the visualized calf veins.
Visualized portions of profunda femoral vein and great saphenous
vein unremarkable. No filling defects to suggest DVT on grayscale or
color Doppler imaging. Doppler waveforms show normal direction of
venous flow, normal respiratory phasicity and response to
augmentation.

Limited views of the contralateral common femoral vein are
unremarkable.

OTHER

Incidental note is made a small Baker cyst in popliteal fossa which
measures 2.4 x 1.0 x 2 1 cm.

Limitations: none
IMPRESSION: No evidence of left lower extremity DVT.

2.4 cm Baker cyst in the left popliteal fossa.

## 2021-05-09 ENCOUNTER — Telehealth (INDEPENDENT_AMBULATORY_CARE_PROVIDER_SITE_OTHER): Payer: Self-pay | Admitting: Vascular Surgery

## 2021-05-09 NOTE — Telephone Encounter (Signed)
Patient is supposed to have hernia surgery coming up (TDB) with Dr. Caroleen Hamman.  The clinic is calling because they have not rec'd clearance from Dr. Lucky Cowboy as patient is taking Eliquis and asprin. Please advise.   Freda Munro from the clinic has sent a second request.

## 2021-05-10 NOTE — Telephone Encounter (Signed)
The NP has the form it will be signed and faxed over.

## 2021-05-11 ENCOUNTER — Telehealth: Payer: Self-pay

## 2021-05-11 NOTE — Telephone Encounter (Signed)
Anticoagulant clearance received from Dr Lucky Cowboy at this time-patient will need Lovenox bridge  as she thrombosis very quickly . High risk but patient is optimized for procedure.

## 2021-05-17 ENCOUNTER — Encounter
Admission: RE | Disposition: A | Discharge status: Home / Self Care | Payer: Self-pay | Source: Home / Self Care | Attending: Internal Medicine

## 2021-05-17 ENCOUNTER — Ambulatory Visit
Admission: RE | Admit: 2021-05-17 | Discharge: 2021-05-17 | Disposition: A | Discharge status: Home / Self Care | Payer: Medicare Other | Attending: Internal Medicine | Admitting: Internal Medicine

## 2021-05-17 DIAGNOSIS — R079 Chest pain, unspecified: Secondary | ICD-10-CM | POA: Diagnosis present

## 2021-05-17 DIAGNOSIS — R0602 Shortness of breath: Secondary | ICD-10-CM | POA: Diagnosis not present

## 2021-05-17 DIAGNOSIS — I2510 Atherosclerotic heart disease of native coronary artery without angina pectoris: Secondary | ICD-10-CM | POA: Diagnosis not present

## 2021-05-17 HISTORY — PX: LEFT HEART CATH AND CORONARY ANGIOGRAPHY: CATH118249

## 2021-05-17 SURGERY — LEFT HEART CATH AND CORONARY ANGIOGRAPHY
Anesthesia: Moderate Sedation

## 2021-05-17 MED ORDER — HEPARIN (PORCINE) IN NACL 1000-0.9 UT/500ML-% IV SOLN
INTRAVENOUS | Status: AC
  Filled 2021-05-17: qty 1000

## 2021-05-17 MED ORDER — LIDOCAINE HCL (PF) 1 % IJ SOLN
INTRAMUSCULAR | Status: DC | PRN
  Administered 2021-05-17: 2 mL

## 2021-05-17 MED ORDER — VERAPAMIL HCL 2.5 MG/ML IV SOLN
INTRAVENOUS | Status: AC
  Filled 2021-05-17: qty 2

## 2021-05-17 MED ORDER — ACETAMINOPHEN 325 MG PO TABS
650.0000 mg | ORAL_TABLET | ORAL | Status: DC | PRN
Start: 2021-05-17 — End: 2021-05-17

## 2021-05-17 MED ORDER — LIDOCAINE HCL (PF) 1 % IJ SOLN
INTRAMUSCULAR | Status: AC
  Filled 2021-05-17: qty 30

## 2021-05-17 MED ORDER — ASPIRIN 81 MG PO CHEW
CHEWABLE_TABLET | ORAL | Status: AC
  Administered 2021-05-17: 81 mg via ORAL
  Filled 2021-05-17: qty 1

## 2021-05-17 MED ORDER — FENTANYL CITRATE (PF) 100 MCG/2ML IJ SOLN
INTRAMUSCULAR | Status: DC | PRN
  Administered 2021-05-17: 25 ug via INTRAVENOUS

## 2021-05-17 MED ORDER — SODIUM CHLORIDE 0.9 % WEIGHT BASED INFUSION: 1.0000 mL/kg/h | INTRAVENOUS | Status: DC

## 2021-05-17 MED ORDER — SODIUM CHLORIDE 0.9% FLUSH: 3.0000 mL | INTRAVENOUS | Status: DC | PRN

## 2021-05-17 MED ORDER — HEPARIN (PORCINE) IN NACL 1000-0.9 UT/500ML-% IV SOLN
INTRAVENOUS | Status: DC | PRN
Start: 2021-05-17 — End: 2021-05-17
  Administered 2021-05-17 (×2): 500 mL

## 2021-05-17 MED ORDER — SODIUM CHLORIDE 0.9 % WEIGHT BASED INFUSION
3.0000 mL/kg/h | INTRAVENOUS | Status: AC
  Administered 2021-05-17: 3 mL/kg/h via INTRAVENOUS

## 2021-05-17 MED ORDER — ASPIRIN 81 MG PO CHEW: 81.0000 mg | CHEWABLE_TABLET | ORAL | Status: AC

## 2021-05-17 MED ORDER — SODIUM CHLORIDE 0.9% FLUSH: 3.0000 mL | Freq: Two times a day (BID) | INTRAVENOUS | Status: DC

## 2021-05-17 MED ORDER — MIDAZOLAM HCL 2 MG/2ML IJ SOLN
INTRAMUSCULAR | Status: DC | PRN
  Administered 2021-05-17: 1 mg via INTRAVENOUS

## 2021-05-17 MED ORDER — SODIUM CHLORIDE 0.9 % IV SOLN: 250.0000 mL | INTRAVENOUS | Status: DC | PRN

## 2021-05-17 MED ORDER — MIDAZOLAM HCL 2 MG/2ML IJ SOLN
INTRAMUSCULAR | Status: AC
  Filled 2021-05-17: qty 2

## 2021-05-17 MED ORDER — HEPARIN SODIUM (PORCINE) 1000 UNIT/ML IJ SOLN
INTRAMUSCULAR | Status: DC | PRN
  Administered 2021-05-17: 5000 [IU] via INTRAVENOUS

## 2021-05-17 MED ORDER — VERAPAMIL HCL 2.5 MG/ML IV SOLN
INTRAVENOUS | Status: DC | PRN
  Administered 2021-05-17: 2.5 mg via INTRA_ARTERIAL

## 2021-05-17 MED ORDER — LABETALOL HCL 5 MG/ML IV SOLN: 10.0000 mg | INTRAVENOUS | Status: DC | PRN

## 2021-05-17 MED ORDER — FENTANYL CITRATE (PF) 100 MCG/2ML IJ SOLN
INTRAMUSCULAR | Status: AC
  Filled 2021-05-17: qty 2

## 2021-05-17 MED ORDER — ONDANSETRON HCL 4 MG/2ML IJ SOLN: 4.0000 mg | Freq: Four times a day (QID) | INTRAMUSCULAR | Status: DC | PRN

## 2021-05-17 MED ORDER — IOHEXOL 300 MG/ML  SOLN
INTRAMUSCULAR | Status: DC | PRN
  Administered 2021-05-17: 76 mL

## 2021-05-17 MED ORDER — HYDRALAZINE HCL 20 MG/ML IJ SOLN: 10.0000 mg | INTRAMUSCULAR | Status: DC | PRN

## 2021-05-17 MED ORDER — HEPARIN SODIUM (PORCINE) 1000 UNIT/ML IJ SOLN
INTRAMUSCULAR | Status: AC
  Filled 2021-05-17: qty 1

## 2021-05-17 SURGICAL SUPPLY — 11 items
CATH INFINITI 5FR JL4 (CATHETERS) ×2 IMPLANT
CATH INFINITI JR4 5F (CATHETERS) ×2 IMPLANT
DEVICE RAD TR BAND REGULAR (VASCULAR PRODUCTS) ×2 IMPLANT
DRAPE BRACHIAL (DRAPES) ×2 IMPLANT
GLIDESHEATH SLEND SS 6F .021 (SHEATH) ×2 IMPLANT
GUIDEWIRE INQWIRE 1.5J.035X260 (WIRE) ×1 IMPLANT
INQWIRE 1.5J .035X260CM (WIRE) ×2
PACK CARDIAC CATH (CUSTOM PROCEDURE TRAY) ×2 IMPLANT
PROTECTION STATION PRESSURIZED (MISCELLANEOUS) ×2
SET ATX SIMPLICITY (MISCELLANEOUS) ×2 IMPLANT
STATION PROTECTION PRESSURIZED (MISCELLANEOUS) ×1 IMPLANT

## 2021-05-18 ENCOUNTER — Other Ambulatory Visit: Payer: Self-pay

## 2021-05-18 ENCOUNTER — Ambulatory Visit (INDEPENDENT_AMBULATORY_CARE_PROVIDER_SITE_OTHER): Payer: Medicare Other | Admitting: Surgery

## 2021-05-18 ENCOUNTER — Encounter: Payer: Self-pay | Admitting: Internal Medicine

## 2021-05-18 ENCOUNTER — Telehealth: Payer: Self-pay | Admitting: Surgery

## 2021-05-18 VITALS — BP 110/76 | HR 84 | Temp 98.8°F | Ht 62.0 in | Wt 225.0 lb

## 2021-05-18 DIAGNOSIS — K432 Incisional hernia without obstruction or gangrene: Secondary | ICD-10-CM | POA: Diagnosis not present

## 2021-05-18 NOTE — Telephone Encounter (Signed)
Left message for patient to call me so that we can get surgery scheduled.

## 2021-05-18 NOTE — Patient Instructions (Addendum)
Follow up in 5 weeks for pre op visit.   We want you to quit smoking completely for at least 2-3 weeks prior to surgery.  You will need a Lovenox bridge for your Eliquis per [facility]. They will contact you about this.   You have requested to have a Ventral Hernia Repair. This will be done by Dr Dahlia Byes at [facility]. Please see your (BLUE) Pre-care sheet for more information. Our surgery scheduler will call you to schedule this and go over information.   You will need to arrange to be out of work for approximately 1-2 weeks and then you may return with a lifting restriction for 4 more weeks. If you have FMLA or Disability paperwork that needs to be filled out, please have your company fax your paperwork to [phone] or you may drop this by either office. This paperwork will be filled out within 3 days after your surgery has been completed.    Ventral Hernia A ventral hernia (also called an incisional hernia) is a hernia that occurs at the site of a previous surgical cut (incision) in the abdomen. The abdominal wall spans from your lower chest down to your pelvis. If the abdominal wall is weakened from a surgical incision, a hernia can occur. A hernia is a bulge of bowel or muscle tissue pushing out on the weakened part of the abdominal wall. Ventral hernias can get bigger from straining or lifting. Obese and older people are at higher risk for a ventral hernia. People who develop infections after surgery or require repeat incisions at the same site on the abdomen are also at increased risk. CAUSES  A ventral hernia occurs because of weakness in the abdominal wall at an incision site.  SYMPTOMS  Common symptoms include: A visible bulge or lump on the abdominal wall. Pain or tenderness around the lump. Increased discomfort if you cough or make a sudden movement. If the hernia has blocked part of the intestine, a serious complication can occur (incarcerated or strangulated hernia). This can  become a problem that requires emergency surgery because the blood flow to the blocked intestine may be cut off. Symptoms may include: Feeling sick to your stomach (nauseous). Throwing up (vomiting). Stomach swelling (distention) or bloating. Fever. Rapid heartbeat. DIAGNOSIS  Your health care provider will take a medical history and perform a physical exam. Various tests may be ordered, such as: Blood tests. Urine tests. Ultrasonography. X-rays. Computed tomography (CT). TREATMENT  Watchful waiting may be all that is needed for a smaller hernia that does not cause symptoms. Your health care provider may recommend the use of a supportive belt (truss) that helps to keep the abdominal wall intact. For larger hernias or those that cause pain, surgery to repair the hernia is usually recommended. If a hernia becomes strangulated, emergency surgery needs to be done right away. HOME CARE INSTRUCTIONS Avoid putting pressure or strain on the abdominal area. Avoid heavy lifting. Use good body positioning for physical tasks. Ask your health care provider about proper body positioning. Use a supportive belt as directed by your health care provider. Maintain a healthy weight. Eat foods that are high in fiber, such as whole grains, fruits, and vegetables. Fiber helps prevent difficult bowel movements (constipation). Drink enough fluids to keep your urine clear or pale yellow. Follow up with your health care provider as directed. SEEK MEDICAL CARE IF:  Your hernia seems to be getting larger or more painful. SEEK IMMEDIATE MEDICAL CARE IF:  You have  abdominal pain that is sudden and sharp. Your pain becomes severe. You have repeated vomiting. You are sweating a lot. You notice a rapid heartbeat. You develop a fever. MAKE SURE YOU:  Understand these instructions. Will watch your condition. Will get help right away if you are not doing well or get worse.     Open Ventral Hernia Repair Open  ventral hernia repair is a surgery to fix a ventral hernia. A ventral hernia,  is a bulge of body tissue or intestines that pushes through the front part of the abdomen. This can happen if the connective tissue covering the muscles over the abdomen has a weak spot or is torn because of a surgical cut (incision) from a previous surgery. A ventral hernia repair is often done soon after diagnosis to stop the hernia from getting bigger, becoming uncomfortable, or becoming an emergency. This surgery usually takes about 2 hours, but the time can vary greatly.  LET [facility] CARE PROVIDER KNOW ABOUT: Any allergies you have. All medicines you are taking, including steroids, vitamins, herbs, eye drops, creams, and over-the-counter medicines. Previous problems you or members of your family have had with the use of anesthetics. Any blood disorders you have. Previous surgeries you have had. Medical conditions you have.  RISKS AND COMPLICATIONS  Generally, Open ventral hernia repair is a safe procedure. However, as with any surgical procedure, problems can occur. Possible problems include: Bleeding. Trouble passing urine or having a bowel movement after the surgery. Infection. Pneumonia. Blood clots. Pain in the area of the hernia. A bulge in the area of the hernia that may be caused by a collection of fluid. Injury to intestines or other structures in the abdomen. Return of the hernia after surgery.  BEFORE THE PROCEDURE  You may need to have blood tests, urine tests, a chest X-ray, or an electrocardiogram done before the day of the surgery. Ask your health care provider about changing or stopping your regular medicines. This is especially important if you are taking diabetes medicines or blood thinners. You may need to wash with a special type of germ-killing soap. Do not eat or drink anything after midnight the night before the procedure or as directed by your health care provider. Make plans to  have someone drive you home after the procedure.  PROCEDURE  Small monitors will be put on your body. They are used to check your heart, blood pressure, and oxygen level. An IV access tube will be put into a vein in your hand or arm. Fluids and medicine will flow directly into your body through the IV tube. You will be given medicine that makes you go to sleep (general anesthetic). Your abdomen will be cleaned with a special soap to kill any germs on your skin. Once you are asleep, a moderate - large size incision will be made in your abdomen. The size of incision depends on how large your hernia is. Your surgeon puts the tissue or intestines that formed the hernia back in place. A screen-like patch (mesh) is used to close the hernia. This helps make the area stronger. Stitches, tacks, or staples are used to keep the mesh in place. Medicine and a bandage (dressing) or skin glue will be put over the incision.  AFTER THE PROCEDURE  You will stay in a recovery area until the anesthetic wears off. Your blood pressure and pulse will be checked often. You may be able to go home the same day or may need to stay  in the hospital for 1-2 days after surgery. Your surgeon will decide when you can go home depending upon your recovery. You may feel some pain. You will be given medicine for pain. You will be urged to do breathing exercises that involve taking deep breaths. This helps prevent a lung infection after a surgery. You may have to wear compression stockings while you are in the hospital. These stockings help keep blood clots from forming in your legs.   This information is not intended to replace advice given to you by your health care provider. Make sure you discuss any questions you have with your health care provider.   Document Released: 09/25/2012 Document Revised: 10/14/2013 Document Reviewed: 09/25/2012 Elsevier Interactive Patient Education [employer].

## 2021-05-19 ENCOUNTER — Telehealth: Payer: Self-pay | Admitting: Surgery

## 2021-05-19 ENCOUNTER — Telehealth: Payer: Self-pay

## 2021-05-19 NOTE — Telephone Encounter (Signed)
Cardiac Clearance faxed to Dr.Bruce Kowalski.. 

## 2021-05-19 NOTE — Telephone Encounter (Signed)
Patient has been advised of Pre-Admission date/time, COVID Testing date and Surgery date.  Surgery Date: 06/28/21 Preadmission Testing Date: 06/17/21 (phone 8a-1p) Covid Testing Date: 06/24/21 @ 8:00 am.  Patient advised to go to the [facility]   Patient has been made aware to call [phone], between 1-3:00pm the day before surgery, to find out what time to arrive for surgery.   All of the above is also mailed to the patient.

## 2021-05-19 NOTE — Progress Notes (Signed)
Sent request for Lovenox bridging to Dr Algernon Huxley office. Patient is scheduled for surgery on 06/28/2021. They advised Lovenox bridge with surgery.

## 2021-05-20 NOTE — Progress Notes (Signed)
Outpatient Surgical Follow Up  05/20/2021  Carla [name] is an 60 y.o. female.   Chief Complaint  Patient presents with   Follow-up    Discuss surgery     HPI: 60 year old female well-known to me with a history of severely symptomatic incisional hernia with loss of domain.  In addition to this she has multiple comorbidities including morbid obesity, chronic pain, peripheral vascular disease requiring multiple interventions.  More recently yesterday she underwent a cardiac cath per cardiology recommendations.  Cardiac cath did not find any evidence of a stent tubal lesions or significantly low limiting coronary lesions that would merit either a stent or CABG surgery.  Continues to endorse severe abdominal pain and she is miserable with this.  She is fortunate that this affects her quality of life and is affecting her daily. She is desperate and keeps coming for help.  She is willing to take any risks in order to alleviate her pain and suffering    Past Medical History:  Diagnosis Date   Abdominal aortic atherosclerosis ([place])    a. 05/2017 CTA abd/pelvis: significant atherosclerotic dzs of the infrarenal abd Ao w/ some mural thrombus. No aneurysm or dissection.   Abdominal pain    Acute focal ischemia of small intestine (HCC)    Acute right-sided low back pain with right-sided sciatica 06/13/2017   AKI (acute kidney injury) ([place]) 07/29/2017   Baker's cyst of knee, right    a. 07/2016 U/S: 4.1 x 1.4 x 2.9 cystic structure in R poplitetal fossa.   Bell's palsy    Borderline diabetes    Cancer ([place])    skin cancer on nose   Chest pain    a. 08/2012 Lexiscan MV: EF 54%, non ischemia/infarct.   CHF (congestive heart failure) (HCC)    Chronic idiopathic constipation    Chronic mesenteric ischemia (HCC)    COPD (chronic obstructive pulmonary disease) (HCC)    Diastolic dysfunction    a. 05/2017 Echo: Ef 60-65%, no rwma, Gr1 DD, no source of cardiac emboli.   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 05/2017 CTA Abd/pelvis: apparent thrombus or embolus in prox SMA (70-90%); b. 05/2017 catheter directed tPA, mechanical thrombectomy, and stenting of the SMA.   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[place]) 06/19/2017   Personal history of other malignant neoplasm of skin 06/01/2016   Pre-diabetes    Primary osteoarthritis of both knees 06/13/2017   Recurrent major depressive disorder ([place]) 01/19/2016   SBO (small bowel obstruction) ([place]) 08/06/2017   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HCC)    Urinary tract infection 01/09/2014    Past Surgical History:  Procedure Laterality Date   AMPUTATION TOE Left 12/09/2019   Procedure: AMPUTATION TOE MPJ left;  Surgeon: Caroline More, DPM;  Location: [facility];  Service: Podiatry;  Laterality: Left;   APPENDECTOMY     CHOLECYSTECTOMY     LAPAROTOMY N/A 08/08/2017   Procedure: EXPLORATORY LAPAROTOMY POSSIBLE BOWEL RESECTION;  Surgeon: Jules Husbands, MD;  Location: [facility];  Service: General;  Laterality: N/A;   LEFT HEART CATH AND CORONARY ANGIOGRAPHY N/A 05/17/2021   Procedure: LEFT HEART CATH AND CORONARY  ANGIOGRAPHY;  Surgeon: Yolonda Kida, MD;  Location: [place] [facility];  Service: Cardiovascular;  Laterality: N/A;   LOWER EXTREMITY ANGIOGRAPHY Left 11/17/2019   Procedure: LOWER EXTREMITY ANGIOGRAPHY;  Surgeon: Algernon Huxley, MD;  Location: [place] [facility];  Service: Cardiovascular;  Laterality: Left;   LOWER EXTREMITY ANGIOGRAPHY Right 12/13/2020   Procedure: LOWER EXTREMITY ANGIOGRAPHY;  Surgeon: Algernon Huxley, MD;  Location: [place] [facility];  Service: Cardiovascular;  Laterality: Right;   LOWER  EXTREMITY ANGIOGRAPHY Right 01/10/2021   Procedure: LOWER EXTREMITY ANGIOGRAPHY;  Surgeon: Algernon Huxley, MD;  Location: [place] [facility];  Service: Cardiovascular;  Laterality: Right;   LOWER EXTREMITY ANGIOGRAPHY Right 01/24/2021   Procedure: LOWER EXTREMITY ANGIOGRAPHY;  Surgeon: Algernon Huxley, MD;  Location: [place] [facility];  Service: Cardiovascular;  Laterality: Right;   LOWER EXTREMITY INTERVENTION N/A 11/19/2019   Procedure: LOWER EXTREMITY INTERVENTION;  Surgeon: Algernon Huxley, MD;  Location: [place] [facility];  Service: Cardiovascular;  Laterality: N/A;   TEE WITHOUT CARDIOVERSION N/A 06/22/2017   Procedure: TRANSESOPHAGEAL ECHOCARDIOGRAM (TEE);  Surgeon: Wellington Hampshire, MD;  Location: [facility];  Service: Cardiovascular;  Laterality: N/A;   TOTAL KNEE ARTHROPLASTY Right 07/11/2018   Procedure: TOTAL KNEE ARTHROPLASTY;  Surgeon: Corky Mull, MD;  Location: [facility];  Service: Orthopedics;  Laterality: Right;   VAGINAL HYSTERECTOMY     VISCERAL ARTERY INTERVENTION N/A 06/20/2017   Procedure: Visceral Artery Intervention, possible aortic thrombectomy;  Surgeon: Algernon Huxley, MD;  Location: [place] [facility];  Service: Cardiovascular;  Laterality: N/A;   VISCERAL ARTERY INTERVENTION N/A 01/28/2018   Procedure: VISCERAL ARTERY INTERVENTION;  Surgeon: Algernon Huxley, MD;  Location: [place] [facility];  Service: Cardiovascular;  Laterality: N/A;    Family History  Problem Relation Age of Onset   Hypertension Mother    Heart disease Mother    Heart attack Mother    Breast cancer Mother    Hypertension Father    Breast cancer Paternal Aunt     Social History:  reports that she has been smoking cigarettes. She has been smoking an average of .1 packs per day. She has never used smokeless tobacco. She reports that she does not drink alcohol and does not use drugs.  Allergies:  Allergies  Allergen Reactions   Gabapentin     Tremors    Bactrim [Sulfamethoxazole-Trimethoprim]  Itching    Medications reviewed.    ROS Full ROS performed and is otherwise negative other than what is stated in HPI   BP 110/76   Pulse 84   Temp 98.8 F (37.1 C)   Ht '5\' 2"'$  (1.575 m)   Wt 225 lb (102.1 kg)   SpO2 95%   BMI 41.15 kg/m   Physical Exam Vitals and nursing note reviewed. Exam conducted with a chaperone present.  Constitutional:      Appearance: She is obese.  Cardiovascular:     Rate and Rhythm: Normal rate and regular rhythm.     Heart sounds: No murmur heard. Pulmonary:     Effort: Pulmonary effort is normal. No respiratory distress.     Breath sounds: Normal breath sounds. No stridor.  Abdominal:     Palpations: Abdomen is soft.     Tenderness: There is abdominal tenderness. There is no guarding or rebound.     Hernia: A hernia is present.     Comments: There is evidence of a chronically incarcerated ventral hernia mostly to the  right side.  I am unable to reduce it.  It is tender to palpation.  There is no peritonitis there is loss of domain  Musculoskeletal:        General: Normal range of motion.     Cervical back: Normal range of motion and neck supple. No rigidity or tenderness.  Skin:    General: Skin is warm.     Capillary Refill: Capillary refill takes less than 2 seconds.  Neurological:     General: No focal deficit present.     Mental Status: She is alert and oriented to person, place, and time.  Psychiatric:        Mood and Affect: Mood normal.        Behavior: Behavior normal.        Thought Content: Thought content normal.        Judgment: Judgment normal.      Assessment/Plan: 60 year old female with severely symptomatic recurrent incisional hernia with loss of domain.  She has multiple comorbidities to include peripheral vascular disease, chronic pain and continues to smoke.  I have been seeing her for the last 10 months regarding this incisional hernia.  She has gone from a BMI of 45-41 now.  She has decreased the smoking use.   I have had extensive conversation with the patient with the family.  As I stated I have worked with her close to year trying to get her to be in the best shape possible.  She continues to exhibit chronic incarceration and severe pain.  He understands that she is at high risk of developing more pain, complications related to her surgery to include mesh infection, recurrence of bowel injuries and cardiovascular events.  Symptoms are getting to the point that is affecting her quality of life and at this point in time she wishes to proceed with hernia repair since she is desperate. Daughter was at the bedside.  They fully understand the situation. I  Told her that I will be willing to perform the surgery but I will like her to make better effort regarding smoking cessation.  I will see her back in a few weeks and tentatively schedule her surgery for September.  Extensive counseling provided.    Greater than 50% of the 40 minutes  visit was spent in counseling/coordination of care   Caroleen Hamman, MD [place] Surgeon

## 2021-05-24 ENCOUNTER — Telehealth: Payer: Self-pay

## 2021-05-24 NOTE — Telephone Encounter (Signed)
Received cardia clearance from Dr. Lujean Amel. Procedure risk assessment is medium. Pt should hold Eliquis for 5 days prior to procedure and restart 2 days past procedure. Pt should also hold Aspirin for 5 days prior and restart 2 days after procedure. Pt is optimized for surgery.

## 2021-05-31 ENCOUNTER — Telehealth (INDEPENDENT_AMBULATORY_CARE_PROVIDER_SITE_OTHER): Payer: Self-pay | Admitting: Vascular Surgery

## 2021-05-31 ENCOUNTER — Telehealth: Payer: Self-pay | Admitting: Surgery

## 2021-05-31 NOTE — Telephone Encounter (Signed)
Patient was advise to contact PCP or the surgeon who be doing the hernia surgery for nerve block to be schedule. Patient was given medical advice and verbalized understanding.

## 2021-05-31 NOTE — Telephone Encounter (Signed)
Incoming call from patient.  She states that her feet are hurting extremely bad and is wanting nerve blocks to be done.  She called Dr. Bunnie Domino office and was told for her to call us.  Patient wants to try and get this done prior to her surgery with Dr. Dahlia Byes on 06/28/21.  She is asking who we could recommend that she sees to get her feet taken care of before her surgery with [place].  Please call her.  Thank you.

## 2021-05-31 NOTE — Telephone Encounter (Signed)
Patient called in stating Dr. Lucky Cowboy gave patient the okay to get nerve blocks done to both of her feet.  She wants to know if she can get Dr. Lucky Cowboy or a nurse to get her an appointment set up to get this done.  Patient will be having hernia Sept 6 and would like to get that done before then. Please advise

## 2021-06-01 NOTE — Telephone Encounter (Signed)
Message left for the patient to call back to speak with me about this. She was seen by Dr Bunnie Domino office and they recommended a referral to pain management for nerve blocks for her feet and legs she was last seen at Pain Management in April. This is not something that [facility] does and she will need to return to Pain management to have this done.

## 2021-06-03 ENCOUNTER — Encounter (INDEPENDENT_AMBULATORY_CARE_PROVIDER_SITE_OTHER): Payer: Medicare Other

## 2021-06-03 ENCOUNTER — Ambulatory Visit (INDEPENDENT_AMBULATORY_CARE_PROVIDER_SITE_OTHER): Payer: Medicare Other | Admitting: Nurse Practitioner

## 2021-06-03 NOTE — Telephone Encounter (Signed)
2nd call placed and message left for the patient letting her know that Pain Management would need to do the injections for her feet and legs.

## 2021-06-16 ENCOUNTER — Other Ambulatory Visit: Payer: Self-pay

## 2021-06-16 ENCOUNTER — Encounter: Payer: Self-pay | Admitting: Student in an Organized Health Care Education/Training Program

## 2021-06-16 ENCOUNTER — Ambulatory Visit
Payer: Medicare Other | Attending: Student in an Organized Health Care Education/Training Program | Admitting: Student in an Organized Health Care Education/Training Program

## 2021-06-16 VITALS — BP 119/68 | HR 96 | Temp 97.3°F | Resp 16 | Ht 62.0 in | Wt 223.0 lb

## 2021-06-16 DIAGNOSIS — K551 Chronic vascular disorders of intestine: Secondary | ICD-10-CM | POA: Diagnosis present

## 2021-06-16 DIAGNOSIS — F17200 Nicotine dependence, unspecified, uncomplicated: Secondary | ICD-10-CM | POA: Insufficient documentation

## 2021-06-16 DIAGNOSIS — G894 Chronic pain syndrome: Secondary | ICD-10-CM | POA: Diagnosis present

## 2021-06-16 DIAGNOSIS — M170 Bilateral primary osteoarthritis of knee: Secondary | ICD-10-CM | POA: Insufficient documentation

## 2021-06-16 DIAGNOSIS — M792 Neuralgia and neuritis, unspecified: Secondary | ICD-10-CM | POA: Insufficient documentation

## 2021-06-16 MED ORDER — ALPHA-LIPOIC ACID 600 MG PO CAPS: 600.0000 mg | ORAL_CAPSULE | Freq: Every day | ORAL | 2 refills | Status: AC

## 2021-06-16 NOTE — Progress Notes (Signed)
PROVIDER NOTE: Information contained herein reflects review and annotations entered in association with encounter. Interpretation of such information and data should be left to medically-trained personnel. Information provided to patient can be located elsewhere in the medical record under "Patient Instructions". Document created using STT-dictation technology, any transcriptional errors that may result from process are unintentional.    Patient: Carla [name]  Service Category: E/M  Provider: Gillis Santa, MD  DOB: 1961/01/08  DOS: 06/16/2021  Specialty: Interventional Pain Management  MRN: [id number]  Setting: Ambulatory outpatient  PCP: Kirk Ruths, MD  Type: Established Patient    Referring Provider: Kirk Ruths, MD  Location: Office  Delivery: Face-to-face     HPI  Carla [name], a 60 y.o. year old female, is here today because of her Chronic pain syndrome [G89.4]. Carla [name] primary complain today is No chief complaint on file. Last encounter: My last encounter with her was on 01/13/2021. Pertinent problems: Carla [name] has Chronic knee pain (Primary Area of Pain) (Bilateral); Chronic pain syndrome; Osteoarthritis of knee (Bilateral); DDD (degenerative disc disease), lumbosacral; and Lumbar spondylosis on their pertinent problem list. Pain Assessment: Severity of Chronic pain is reported as a 9 /10. Location: Foot Right, Left/denies. Onset: More than a month ago. Quality: Aching, Burning, Pins and needles, Numbness, Discomfort (electric shock). Timing: Constant. Modifying factor(s): Nothing. Vitals:  height is $RemoveB'5\' 2"'eUOgbJJE$  (1.575 m) and weight is 223 lb (101.2 kg). Her temperature is 97.3 F (36.3 C) (abnormal). Her blood pressure is 119/68 and her pulse is 96. Her respiration is 16.   Reason for encounter:   Patient AGAIN  never started alpha lipoic acid as I recommended during her last clinic visit.  When I asked her why, she states that she forgot.  It is obvious that the patient is  a poor historian.  She continues to smoke daily.  She has depression and anxiety which in my opinion increases her risk of opioid misuse and abuse.  Given that she is planning on having a big surgery, I informed her that it is in her best interest to try and wean and discontinue any opioid analgesics so that they can be utilized more effective in the postoperative period for her postoperative pain.     Carla [name] is a 60 year old female who presents with a chief complaint of low back pain related to lumbar degenerative disc disease and lumbar facet arthropathy as well as bilateral lower extremity pain related to lower extremity neuropathy.  She has been evaluated by orthopedics, sports medicine as well as neurology in the past.  She has tried gabapentin without any significant benefit.  She is on Lyrica 100 mg 3 times a day.  She is also on amitriptyline 25 mg daily.  She states that she has tried injections in her lumbar spine in the past which were not effective and made her pain worse.  She has not tried physical therapy.  She is morbidly obese.  She continues to smoke daily.  She is not interested in proceeding with physical therapy.  She is on Cymbalta 60 mg daily as well.  A&P Carla [name] presents today with persistent and severe lower extremity neuropathic pain that has been refractory to various neuropathic's including amitriptyline, gabapentin, Lyrica.  She is currently on Lyrica 100 mg 3 times a day which is a fairly high dose.  I recommend that she consider increasing her amitriptyline to 50 mg nightly for analgesic/neuropathic benefit.  She has tried lumbar spinal injections and is  not interested in any interventional treatments.  She is not interested in lidocaine infusion for lower extremity neuropathic pain.  I recommend that she consider physical therapy but she is not interested.  Patient does have a history of severe obesity which I think is strongly contributing to her lower back musculoskeletal  pain.  I recommend aquatic therapy for the patient she declined.  I do not recommend chronic opioid therapy for her condition in the context of her being on multiple sedative medications, poor historian, psychiatric history including depression and anxiety, significant cardiac history and concerns of obstructive sleep apnea as well as a history of chronic constipation.  I also counseled the patient on smoking cessation. Given that the patient is not a candidate for chronic opioid therapy nor is she interested in any physical therapy or interventional treatments, and follow-up with her primary care provider for further care.  02/03/2019: Patient presents today for her second patient visit.  She is requesting "stronger pain medication".  Of note, she does not have a contract with our pain clinic nor do I plan on prescribing any long-term opioid therapy for her.  She was prescribed quantity 28 of oxycodone on 4/5 and has ran out of those already.  She did have a right lower extremity angiography on 01/24/2021.  She may also need to have a large ventral hernia surgery in the future.  Her surgeon has told her that this procedure carries significant morbidity and is high risk.   ROS  Constitutional: Denies any fever or chills Gastrointestinal: No reported hemesis, hematochezia, vomiting, or acute GI distress Musculoskeletal:  Right lower quadrant abdominal pain, polyarthralgias, polymyalgias. Neurological: No reported episodes of acute onset apraxia, aphasia, dysarthria, agnosia, amnesia, paralysis, loss of coordination, or loss of consciousness  Medication Review  Alpha-Lipoic Acid, Ipratropium-Albuterol, albuterol, amLODipine, apixaban, aspirin EC, atorvastatin, buPROPion ER, ferrous sulfate, furosemide, metoprolol tartrate, omeprazole, pregabalin, and varenicline  History Review  Allergy: Carla [name] is allergic to gabapentin and bactrim [sulfamethoxazole-trimethoprim]. Drug: Carla [name]  reports no history of  drug use. Alcohol:  reports no history of alcohol use. Tobacco:  reports that she has been smoking cigarettes. She has been smoking an average of .1 packs per day. She has never used smokeless tobacco. Social: Carla [name]  reports that she has been smoking cigarettes. She has been smoking an average of .1 packs per day. She has never used smokeless tobacco. She reports that she does not drink alcohol and does not use drugs. 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[place]), Essential hypertension with goal blood pressure less than 140/90 (01/19/2016), Gastroesophageal reflux disease (01/19/2016), GERD (gastroesophageal reflux disease), H/O colectomy, History of kidney stones, Hyperlipidemia, Hypertension, Hypotension (07/29/2017), Morbid obesity with BMI of 40.0-44.9, adult ([place]), Occlusive 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 Family: family history includes Breast cancer in her mother and paternal aunt; Heart attack in her mother; Heart disease in her mother; Hypertension in her father and mother.  Laboratory Chemistry Profile   Renal Lab Results  Component Value Date   BUN 18 01/27/2021   CREATININE 0.95 01/27/2021   GFRAA >60 11/23/2019   GFRNONAA >60 01/27/2021     Hepatic Lab Results  Component Value Date   AST 15 09/30/2020   ALT 13 09/30/2020   ALBUMIN 3.3 (L) 09/30/2020   ALKPHOS 89 09/30/2020   LIPASE 35 11/10/2018   AMMONIA 28 04/08/2018     Electrolytes Lab Results  Component Value Date   NA 141 01/27/2021   K 4.6 01/27/2021   CL 106 01/27/2021   CALCIUM 8.2 (L) 01/27/2021   MG 2.2 01/27/2021   PHOS 5.3 (H) 01/27/2021     Bone No results found for: VD25OH, GE366QH4TML, YY5035WS5, KC1275TZ0, 25OHVITD1, 25OHVITD2, 25OHVITD3, TESTOFREE, TESTOSTERONE   Inflammation (CRP: Acute Phase) (ESR: Chronic Phase) Lab Results  Component Value Date   ESRSEDRATE 49 (H) 07/29/2017   LATICACIDVEN 1.9 09/17/2018       Note: Above Lab results reviewed.  Recent Imaging Review  CARDIAC CATHETERIZATION Formatting of this result is different from the original.    Prox RCA lesion is 60% stenosed.   1st Diag lesion is 50% stenosed.   The left ventricular systolic function is normal.   LV end diastolic pressure is normal.   The left ventricular ejection fraction is 55-65% by visual estimate.  Conclusion Normal left ventricular function of at least 55% Coronaries Left main large free of disease LAD  large minor irregularities Diagonal 1   50% ostial lesion Circumflex large free of disease RCA large 60-70% proximal Right dominant Intervention deferred Recommend medical therapy Note: Reviewed        Physical Exam  General appearance: Well nourished, well developed, and well hydrated. In no apparent acute distress Mental status: Alert, oriented x 3 (person, place, & time)       Respiratory: No evidence of acute respiratory distress Eyes: PERLA Vitals: BP 119/68   Pulse 96   Temp (!) 97.3 F (36.3 C)   Resp 16   Ht $R'5\' 2"'kZ$  (1.575 m)   Wt 223 lb (101.2 kg)   BMI 40.79 kg/m  BMI: Estimated body mass index is 40.79 kg/m as calculated from the following:   Height as of this encounter: $RemoveBeforeD'5\' 2"'uklVAhLtUPRMEY$  (1.575 m).   Weight as of this encounter: 223 lb (101.2 kg). Ideal: Ideal body weight: 50.1 kg (110 lb 7.2 oz) Adjusted ideal body weight: 70.5 kg (155 lb 7.5 oz)  Lumbar Exam  Skin & Axial Inspection: No masses, redness, or swelling Alignment: Symmetrical Functional ROM: Pain restricted ROM       Stability: No instability detected Muscle Tone/Strength: Functionally intact. No obvious neuro-muscular anomalies detected. Sensory (Neurological): Musculoskeletal pain pattern     Gait & Posture Assessment  Ambulation: Limited Gait: Antalgic gait (limping) Posture: Difficulty standing up straight, due to pain    Lower Extremity Exam      Side: Right lower extremity   Side: Left lower extremity  Stability:  No instability observed           Stability: No instability observed          Skin & Extremity Inspection: Evidence of prior arthroplastic surgery   Skin & Extremity Inspection: Evidence of prior arthroplastic surgery  Functional ROM: Pain restricted ROM                   Functional ROM: Pain restricted ROM                  Muscle Tone/Strength: Functionally intact. No obvious neuro-muscular anomalies detected.   Muscle Tone/Strength: Functionally intact. No obvious neuro-muscular  anomalies detected.  Sensory (Neurological): Musculoskeletal pain pattern         Sensory (Neurological): Musculoskeletal pain pattern        DTR: Patellar: deferred today Achilles: deferred today Plantar: deferred today   DTR: Patellar: deferred today Achilles: deferred today Plantar: deferred today  Palpation: No palpable anomalies   Palpation: No palpable anomalies     Assessment   Status Diagnosis  Persistent Persistent Persistent 1. Chronic pain syndrome   2. Chronic mesenteric ischemia ([place])   3. Neuropathic pain   4. Osteoarthritis of knee (Bilateral)   5. Smoker        Plan of Care   Carla [name] has a current medication list which includes the following long-term medication(s): albuterol, amlodipine, apixaban, ferrous sulfate, furosemide, metoprolol tartrate, pregabalin, and bupropion er.   Requested Prescriptions   Signed Prescriptions Disp Refills   Alpha-Lipoic Acid 600 MG CAPS 30 capsule 2    Sig: Take 1 capsule (600 mg total) by mouth daily.     Smoking cessation counseling: Pt acknowledges the risks of long term smoking, she will try to quit smoking. Options for different medications including nicotine products, chewing gum, patch etc, Wellbutrin and Chantix is discussed    Follow-up plan:   No follow-ups on file.   Recent Visits No visits were found meeting these conditions. Showing recent visits within past 90 days and meeting all other requirements Today's Visits Date Type Provider Dept  06/16/21 Office Visit Gillis Santa, MD [facility]  Showing today's visits and meeting all other requirements Future Appointments No visits were found meeting these conditions. Showing future appointments within next 90 days and meeting all other requirements I discussed the assessment and treatment plan with the patient. The patient was provided an opportunity to ask questions and all were answered. The patient agreed with the plan and  demonstrated an understanding of the instructions.  Patient advised to call back or seek an in-person evaluation if the symptoms or condition worsens.  Duration of encounter: 71minutes.  Note by: Gillis Santa, MD Date: 06/16/2021; Time: 12:01 PM

## 2021-06-17 ENCOUNTER — Inpatient Hospital Stay: Admission: RE | Admit: 2021-06-17 | Payer: Medicare Other | Source: Ambulatory Visit

## 2021-06-20 ENCOUNTER — Encounter: Payer: Self-pay | Admitting: Urgent Care

## 2021-06-20 ENCOUNTER — Other Ambulatory Visit
Admission: RE | Admit: 2021-06-20 | Discharge: 2021-06-20 | Disposition: A | Discharge status: Home / Self Care | Payer: Medicare Other | Source: Ambulatory Visit | Attending: Surgery | Admitting: Surgery

## 2021-06-20 HISTORY — DX: Angina pectoris, unspecified: I20.9

## 2021-06-20 HISTORY — DX: Dyspnea, unspecified: R06.00

## 2021-06-20 HISTORY — DX: Pneumonia, unspecified organism: J18.9

## 2021-06-20 NOTE — Patient Instructions (Addendum)
Your procedure is scheduled on: 06/28/21  Report to the Registration Desk on the 1st floor of the [facility]. To find out your arrival time, please call [phone] between 1PM - 3PM on: 06/27/21   REMEMBER: Instructions that are not followed completely may result in serious medical risk, up to and including death; or upon the discretion of your surgeon and anesthesiologist your surgery may need to be rescheduled.  Do not eat food after midnight the night before surgery.  No gum chewing, lozengers or hard candies.  You may however, drink CLEAR liquids up to 2 hours before you are scheduled to arrive for your surgery. Do not drink anything within 2 hours of your scheduled arrival time.  Clear liquids include: - water  - apple juice without pulp - gatorade (not RED, PURPLE, OR BLUE) - black coffee or tea (Do NOT add milk or creamers to the coffee or tea) Do NOT drink anything that is not on this list.  TAKE THESE MEDICATIONS THE MORNING OF SURGERY WITH A SIP OF WATER:  - amLODipine (NORVASC) 5 MG tablet - metoprolol tartrate (LOPRESSOR) 50 MG tablet - omeprazole (PRILOSEC) 20 MG capsule, take one the night before and one on the morning of surgery - helps to prevent nausea after surgery. - pregabalin (LYRICA) 200 MG capsule  Use albuterol (VENTOLIN HFA) 108 (90 Base) MCG/ACT inhaler on the day of surgery and bring to the hospital.  Follow recommendations from Cardiologist, Pulmonologist or PCP regarding stopping Aspirin, Coumadin, Plavix, Eliquis, Pradaxa, or Pletal. Stop taking Eliquis and Aspirin beginning 06/23/21, may resume Eliquis and Aspirin 06/30/21.  One week prior to surgery: Stop Anti-inflammatories (NSAIDS) such as Advil, Aleve, Ibuprofen, Motrin, Naproxen, Naprosyn and Aspirin based products such as Excedrin, Goodys Powder, BC Powder.  Stop ANY OVER THE COUNTER supplements until after surgery.   You may however, continue to take Tylenol if needed for pain up until  the day of surgery.  No Alcohol for 24 hours before or after surgery.  No Smoking including e-cigarettes for 24 hours prior to surgery.  No chewable tobacco products for at least 6 hours prior to surgery.  No nicotine patches on the day of surgery.  Do not use any "recreational" drugs for at least a week prior to your surgery.  Please be advised that the combination of cocaine and anesthesia may have negative outcomes, up to and including death. If you test positive for cocaine, your surgery will be cancelled.  On the morning of surgery brush your teeth with toothpaste and water, you may rinse your mouth with mouthwash if you wish. Do not swallow any toothpaste or mouthwash.  Do not wear jewelry, make-up, hairpins, clips or nail polish.  Do not wear lotions, powders, or perfumes.   Do not shave body from the neck down 48 hours prior to surgery just in case you cut yourself which could leave a site for infection.  Also, freshly shaved skin may become irritated if using the CHG soap.  Contact lenses, hearing aids and dentures may not be worn into surgery.  Do not bring valuables to the hospital. [facility] is not responsible for any missing/lost belongings or valuables.   Use CHG Soap or wipes as directed on instruction sheet.  Notify your doctor if there is any change in your medical condition (cold, fever, infection).  Wear comfortable clothing (specific to your surgery type) to the hospital.  After surgery, you can help prevent lung complications by doing breathing exercises.  Take deep breaths and cough every 1-2 hours. Your doctor may order a device called an Incentive Spirometer to help you take deep breaths. When coughing or sneezing, hold a pillow firmly against your incision with both hands. This is called "splinting." Doing this helps protect your incision. It also decreases belly discomfort.  If you are being admitted to the hospital overnight, leave your suitcase in the  car. After surgery it may be brought to your room.  If you are being discharged the day of surgery, you will not be allowed to drive home. You will need a responsible adult (18 years or older) to drive you home and stay with you that night.   If you are taking public transportation, you will need to have a responsible adult (18 years or older) with you. Please confirm with your physician that it is acceptable to use public transportation.   Please call the [facility] Dept. at [phone] if you have any questions about these instructions.  Surgery Visitation Policy:  Patients undergoing a surgery or procedure may have one family member or support person with them as long as that person is not COVID-19 positive or experiencing its symptoms.  That person may remain in the waiting area during the procedure.  Inpatient Visitation:    Visiting hours are 7 a.m. to 8 p.m. Inpatients will be allowed two visitors daily. The visitors may change each day during the patient's stay. No visitors under the age of 69. Any visitor under the age of 6 must be accompanied by an adult. The visitor must pass COVID-19 screenings, use hand sanitizer when entering and exiting the patient's room and wear a mask at all times, including in the patient's room. Patients must also wear a mask when staff or their visitor are in the room. Masking is required regardless of vaccination status.

## 2021-06-20 NOTE — Addendum Note (Signed)
Addended by: Caroleen Hamman F on: 06/20/2021 10:55 AM   Modules accepted: Orders, SmartSet

## 2021-06-21 ENCOUNTER — Encounter: Payer: Self-pay | Admitting: Surgery

## 2021-06-21 NOTE — Progress Notes (Signed)
Perioperative Services  Pre-Admission/Anesthesia Testing Clinical Review  Date: 06/21/21  Patient Demographics:  Name: Carla [name] DOB:   1961/03/30 MRN:   [id number]  Planned Surgical Procedure(s):    Case: [id number] Date/Time: 06/28/21 1050   Procedures:      REPAIR ABDOMINAL WALL, reconstruction w/Carla [name] to assist     HERNIA REPAIR VENTRAL ADULT with Carla Ket PA-C to assist   Anesthesia type: General   Pre-op diagnosis: recurrent ventral hernia   Location: [facility] OR ROOM 06 / [place] [facility]   Surgeons: Carla Husbands, MD     NOTE: Available PAT nursing documentation and vital signs have been reviewed. Clinical nursing staff has updated patient's PMH/PSHx, current medication list, and drug allergies/intolerances to ensure comprehensive history available to assist in medical decision making as it pertains to the aforementioned surgical procedure and anticipated anesthetic course. Extensive review of available clinical information performed. Carla [name] PMH and PSHx updated with any diagnoses/procedures that  may have been inadvertently omitted during her intake with the pre-admission testing department's nursing staff.  Clinical Discussion:  Carla [name] is a 60 y.o. female who is submitted for pre-surgical anesthesia review and clearance prior to her undergoing the above procedure. Patient is a Current Smoker. [name] includes: CHF, PAF, PAD, aortic atherosclerosis, palpitations, SMA thrombosis/embolus, chronic mesenteric ischemia, HTN, HLD, borderline diabetes, COPD,, DOE, peripheral edema, OA, chronic pain syndrome, anxiety, depression.  Patient is followed by cardiology Carla Bigness, MD). She was last seen in the cardiology clinic on 06/02/2021; notes reviewed.  At the time of her clinic visit, patient reporting overall improvement in her chest pain, however reported that she has continued to experience episodes at least once per week with (+) radiation  into her RIGHT jaw and RIGHT upper extremity.  Patient with chronic dyspnea and lower extremity edema that she reports to be at baseline.  She denies any PND, orthopnea, palpitations, vertiginous symptoms, or presyncope/syncope. PMH significant for cardiovascular diagnoses.  TTE performed on 06/20/2017 revealed normal left ventricular systolic function with an EF of 60 to 65%.  There was mild concentric LVH.  Doppler parameters consistent with abnormal relaxation (G1DD).  Atria normal in size.  PASP within normal range.  Subsequent studies performed on 04/08/2018, 07-15-18, 07/29/2019, and 06/22/2020 have remained grossly unchanged.  Myocardial perfusion imaging study performed on 06/28/2018 revealing no evidence of stress-induced myocardial ischemia or arrhythmia.  LVEF approximately 60%.  Repeat study performed on 07/29/2019 remain grossly unchanged.  Patient presented to [name] on 06/21/2020 with lower limb ischemia.  CTA imaging revealed BILATERAL lower extremity limb ischemia secondary to acute aortoiliac thrombosis and three-vessel runoff occlusion in the LEFT lower extremity.  On 06/21/2020, patient underwent stenting of her BILATERAL common iliac arteries and RIGHT external iliac artery.  Embolectomy of the RIGHT deep femoral artery and BILATERAL common iliac arteries performed.  Patient also underwent a fasciotomy of the LEFT lower extremity with LEFT PT and DP cutdown and RIGHT groin cutdown incisions  Myocardial perfusion imaging study performed on 10/05/2020 revealed a severely decreased left ventricular systolic function with an EF of 25-30%.  There was global hypokinesis with evidence of a significant defect in the inferior region with no clear evidence of redistribution.  Left ventricular cavity mildly enlarged.  Diagnostic left heart catheterization performed on 05/17/2021 revealing normal left ventricular systolic function with an EF of at least 55%.  CAD noted; 60-70% stenosis of  the proximal RCA and 50% stenosis of the first diagonal.  LM was free of disease.  LAD noted be large with minor irregularities.  LVEDP normal.  Decision was made to defer intervention and pursue medical therapy.  Patient with a paroxysmal atrial fibrillation diagnosis. CHA2DS2-VASc Score = 4 (sex, CHF, HTN, aortic plaque).  She is on chronic anticoagulation using daily apixaban and low-dose ASA; compliant with therapy with no evidence of GI bleeding.  Rate and rhythm maintained with beta-blocker monotherapy.  Blood pressure well controlled at 116/84 on prescribed CCB, diuretic, and beta-blocker therapies.  She is on a statin for her HLD.  Patient is not diabetic.  Functional capacity limited by multiple medical comorbidities; unable to achieve 4 METS of activity.  Plan from cardiology is to treat chest pain episodes with SL NTG as she is not experiencing daily symptoms.  Chronic dyspnea felt to be related to her underlying CHF, COPD, and continued tobacco use.  Patient reported to appear chronically ill, however overall well and in no acute distress while in clinic.  No changes were made to her medication regimen.  Patient to follow-up with outpatient cardiology in 3 months or sooner if needed.  Patient is scheduled to undergo a ventral hernia repair with abdominal wall reconstruction procedure on 06/28/2021 with Dr. Caroleen Hamman, MD.  Given patient's past medical history significant for cardiovascular disease and interventions, presurgical cardiac clearance was sought by the performing surgeon's office and PAT team. Per cardiology, "this patient is optimized for surgery and may proceed with the planned procedural course with a MODERATE risk of significant perioperative cardiovascular complications". Again, this patient is on daily anticoagulation therapy.  She has been instructed on recommendations from cardiology for holding both her ASA and apixaban for 5 days prior to her procedure with plans to restart 2  days postoperatively, or as soon as postoperative bleeding risk felt to be minimized by her primary attending surgeon.  The patient is aware that her last dose of these medications will be on 06/22/2021.  Patient denies previous perioperative complications with anesthesia in the past. In review of the available records, it is noted that patient underwent a general anesthetic course at [facility] ([facility]) in 05/2020 without documented complications.   Vitals with BMI 06/16/2021 05/18/2021 05/17/2021  Height 5' 2" 5' 2" -  Weight 223 lbs 225 lbs -  BMI 24.09 73.53 -  Systolic 299 242 683  Diastolic 68 76 78  Pulse 96 84 74    Providers/Specialists:   NOTE: Primary physician provider listed below. Patient may have been seen by APP or partner within same practice.   PROVIDER ROLE / SPECIALTY LAST Suszanne Finch, MD General Surgery 05/18/2021  Kirk Ruths, MD Primary Care Provider 05/10/2021  Katrine Coho, MD Cardiology 06/02/2021  [name], MD Vascular Surgery 05/03/2021  Gillis Santa, MD Pain Management 06/16/2021   Allergies:  Gabapentin and Bactrim [sulfamethoxazole-trimethoprim]  Current Home Medications:   No current facility-administered medications for this encounter.    albuterol (VENTOLIN HFA) 108 (90 Base) MCG/ACT inhaler   Alpha-Lipoic Acid 600 MG CAPS   amLODipine (NORVASC) 5 MG tablet   apixaban (ELIQUIS) 5 MG TABS tablet   aspirin EC 81 MG tablet   atorvastatin (LIPITOR) 80 MG tablet   buPROPion (WELLBUTRIN SR) 100 MG 12 hr tablet   COMBIVENT RESPIMAT 20-100 MCG/ACT AERS respimat   ferrous sulfate 325 (65 FE) MG tablet   furosemide (LASIX) 40 MG tablet   metoprolol tartrate (LOPRESSOR) 50 MG tablet   omeprazole (PRILOSEC) 20 MG  capsule   pregabalin (LYRICA) 200 MG capsule   varenicline (CHANTIX) 0.5 MG tablet   History:   Past Medical History:  Diagnosis Date   Abdominal aortic atherosclerosis ([place])    a. 05/2017 CTA abd/pelvis:  significant atherosclerotic dzs of the infrarenal abd Ao w/ some mural thrombus. No aneurysm or dissection.   Acute focal ischemia of small intestine (HCC)    Acute right-sided low back pain with right-sided sciatica 06/13/2017   AKI (acute kidney injury) ([place]) 07/29/2017   Anginal pain ([place])    a. 08/2012 Lexiscan MV: EF 54%, non ischemia/infarct.   Anxiety and depression    Baker's cyst of knee, right    a. 07/2016 U/S: 4.1 x 1.4 x 2.9 cystic structure in R poplitetal fossa.   Bell's palsy    Borderline diabetes    CHF (congestive heart failure) (HCC)    Chronic anticoagulation    apixaban   Chronic idiopathic constipation    Chronic mesenteric ischemia (HCC)    Chronic pain    COPD (chronic obstructive pulmonary disease) (HCC)    Diastolic dysfunction    a. 05/2017 Echo: Ef 60-65%, no rwma, Gr1 DD, no source of cardiac emboli.   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 05/2017 CTA Abd/pelvis: apparent thrombus or embolus in prox SMA (70-90%); b. 05/2017 catheter directed tPA, mechanical thrombectomy, and stenting of the SMA.   Essential hypertension with goal blood pressure less than 140/90 01/19/2016   Gastroesophageal reflux disease 01/19/2016   GERD (gastroesophageal reflux disease)    H/O colectomy    History of 2019 novel coronavirus disease (COVID-19) 11/14/2019   History of kidney stones    Hyperlipidemia    Hypertension    Morbid obesity with BMI of 40.0-44.9, adult ([place])    Occlusive mesenteric ischemia ([place]) 06/19/2017   Osteoarthritis    PAD (peripheral artery disease) (HCC)    PAF (paroxysmal atrial fibrillation) (HCC)    Palpitations    Peripheral edema    Personal history of other malignant neoplasm of skin 06/01/2016   Pneumonia    Pre-diabetes    Primary osteoarthritis of both knees 06/13/2017   SBO (small bowel obstruction) ([place]) 08/06/2017   Skin cancer of nose    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HCC)    Vitamin D deficiency     Past Surgical History:  Procedure Laterality Date   AMPUTATION TOE Left 12/09/2019   Procedure: AMPUTATION TOE MPJ left;  Surgeon: Caroline More, DPM;  Location: [facility] ORS;  Service: Podiatry;  Laterality: Left;   APPENDECTOMY     CHOLECYSTECTOMY     COLON SURGERY     EMBOLECTOMY OR THROMBECTOMY, WITH OR WITHOUT CATHETER; FEMOROPOPLITEAL, AORTOILIAC ARTERY, BY LEG INCISION N/A 06/21/2020   Left - Decomp Fasciotomy Leg; Ant &/Or Lat Compart Only; Midline - Angiography, Visceral, Selective Or Supraselective (With Or Without Flush Aortogram); Left - Revascularize, Endovasc, Open/Percut, Iliac Artery, Unilat, Initial Vessel; W/Translum Stent, W/Angioplasty; Right - Revascularize, Endovasc, Open/Percut, Iliac Artery, Ea Add`L Ipsilateral; W/Translumin Stent, W/Angioplasty; Location [facility];   LAPAROTOMY N/A 08/08/2017   Procedure: EXPLORATORY LAPAROTOMY POSSIBLE BOWEL RESECTION;  Surgeon: Carla Husbands, MD;  Location: [facility] ORS;  Service: General;  Laterality: N/A;   LEFT HEART CATH AND CORONARY ANGIOGRAPHY N/A 05/17/2021   Procedure: LEFT HEART CATH AND CORONARY ANGIOGRAPHY;  Surgeon: Yolonda Kida, MD;  Location: [place] [facility];  Service: Cardiovascular;  Laterality: N/A;   LOWER EXTREMITY ANGIOGRAPHY Left 11/17/2019  Procedure: LOWER EXTREMITY ANGIOGRAPHY;  Surgeon: Algernon Huxley, MD;  Location: [place] [facility];  Service: Cardiovascular;  Laterality: Left;   LOWER EXTREMITY ANGIOGRAPHY Right 12/13/2020   Procedure: LOWER EXTREMITY ANGIOGRAPHY;  Surgeon: Algernon Huxley, MD;  Location: [place] [facility];  Service: Cardiovascular;  Laterality: Right;   LOWER EXTREMITY ANGIOGRAPHY Right 01/10/2021   Procedure: LOWER EXTREMITY ANGIOGRAPHY;  Surgeon: Algernon Huxley, MD;  Location: [place] [facility];  Service: Cardiovascular;  Laterality: Right;   LOWER EXTREMITY ANGIOGRAPHY Right 01/24/2021   Procedure: LOWER EXTREMITY ANGIOGRAPHY;  Surgeon: Algernon Huxley, MD;  Location: [place] [facility];  Service: Cardiovascular;  Laterality: Right;   LOWER EXTREMITY INTERVENTION N/A 11/19/2019   Procedure: LOWER EXTREMITY INTERVENTION;  Surgeon: Algernon Huxley, MD;  Location: [place] [facility];  Service: Cardiovascular;  Laterality: N/A;   TEE WITHOUT CARDIOVERSION N/A 06/22/2017   Procedure: TRANSESOPHAGEAL ECHOCARDIOGRAM (TEE);  Surgeon: Wellington Hampshire, MD;  Location: [facility] ORS;  Service: Cardiovascular;  Laterality: N/A;   TOTAL KNEE ARTHROPLASTY Right 07/11/2018   Procedure: TOTAL KNEE ARTHROPLASTY;  Surgeon: Corky Mull, MD;  Location: [facility] ORS;  Service: Orthopedics;  Laterality: Right;   VAGINAL HYSTERECTOMY     VISCERAL ARTERY INTERVENTION N/A 06/20/2017   Procedure: Visceral Artery Intervention, possible aortic thrombectomy;  Surgeon: Algernon Huxley, MD;  Location: [place] [facility];  Service: Cardiovascular;  Laterality: N/A;   VISCERAL ARTERY INTERVENTION N/A 01/28/2018   Procedure: VISCERAL ARTERY INTERVENTION;  Surgeon: Algernon Huxley, MD;  Location: [place] [facility];  Service: Cardiovascular;  Laterality: N/A;   Family History  Problem Relation Age of Onset   Hypertension Mother    Heart disease Mother    Heart attack Mother    Breast cancer Mother    Hypertension Father    Breast cancer Paternal Aunt    Social History   Tobacco Use   Smoking status: Some Days    Packs/day: 0.10    Types: Cigarettes   Smokeless tobacco: Never  Vaping Use   Vaping Use: Never used  Substance Use Topics   Alcohol use: No   Drug use: No    Pertinent Clinical Results:  LABS: Labs reviewed: Acceptable for surgery.   Ref Range & Units 04/20/2021  WBC (White Blood Cell Count) 4.1 - 10.2 10^3/uL 7.9   RBC (Red Blood Cell Count) 4.04 - 5.48 10^6/uL 4.59   Hemoglobin 12.0 - 15.0 gm/dL 13.3   Hematocrit 35.0 - 47.0 % 41.7   MCV (Mean Corpuscular Volume) 80.0 - 100.0 fl 90.8   MCH (Mean Corpuscular Hemoglobin) 27.0 - 31.2 pg 29.0   MCHC (Mean Corpuscular Hemoglobin  Concentration) 32.0 - 36.0 gm/dL 31.9 Low    Platelet Count 150 - 450 10^3/uL 263   RDW-CV (Red Cell Distribution Width) 11.6 - 14.8 % 14.7   MPV (Mean Platelet Volume) 9.4 - 12.4 fl 9.5   Neutrophils 1.50 - 7.80 10^3/uL 4.73   Lymphocytes 1.00 - 3.60 10^3/uL 2.47   Monocytes 0.00 - 1.50 10^3/uL 0.48   Eosinophils 0.00 - 0.55 10^3/uL 0.18   Basophils 0.00 - 0.09 10^3/uL 0.04   Neutrophil % 32.0 - 70.0 % 59.6   Lymphocyte % 10.0 - 50.0 % 31.1   Monocyte % 4.0 - 13.0 % 6.1   Eosinophil % 1.0 - 5.0 % 2.3   Basophil% 0.0 - 2.0 % 0.5   Immature Granulocyte % <=0.7 % 0.4   Immature Granulocyte Count <=0.06 10^3/L  0.03   Resulting Agency  [place] - LAB  Specimen Collected: 04/20/21 10:35 Last Resulted: 04/20/21 11:09  Received From: [place]  Result Received: 04/22/21 15:47    Ref Range & Units 04/20/2021  Glucose 70 - 110 mg/dL 117 High    Sodium 136 - 145 mmol/L 140   Potassium 3.6 - 5.1 mmol/L 4.0   Chloride 97 - 109 mmol/L 108   Carbon Dioxide (CO2) 22.0 - 32.0 mmol/L 24.8   Urea Nitrogen (BUN) 7 - 25 mg/dL 15   Creatinine 0.6 - 1.1 mg/dL 0.8   Glomerular Filtration Rate (eGFR), MDRD Estimate >60 mL/min/1.73sq m 73   Calcium 8.7 - 10.3 mg/dL 8.8   AST  8 - 39 U/L 7 Low    ALT  5 - 38 U/L 15   Alk Phos (alkaline Phosphatase) 34 - 104 U/L 110 High    Albumin 3.5 - 4.8 g/dL 3.7   Bilirubin, Total 0.3 - 1.2 mg/dL 0.2 Low    Protein, Total 6.1 - 7.9 g/dL 6.5   A/G Ratio 1.0 - 5.0 gm/dL 1.3   Resulting Agency  [place] - LAB  Specimen Collected: 04/20/21 10:35 Last Resulted: 04/20/21 15:57  Received From: [place]  Result Received: 04/22/21 15:47    ECG: Date: 04/19/2021 Rate: 65 bpm Rhythm: normal sinus Axis (leads I and aVF): Normal Intervals: PR 158 ms. QRS 88 ms. QTc 416 ms. ST segment and T wave changes: No evidence of acute ST segment elevation or depression; evidence of an age undetermined anterior  infarct noted. Comparison: Similar to previous tracing obtained on 08/24/2020  IMAGING / PROCEDURES: LEFT HEART CATHETERIZATION AND CORONARY ANGIOGRAPHY performed on 05/17/2021  Normal left ventricular systolic function with an EF of at least 55% Left main free of disease LAD large with minor irregularities 50% stenosis of the diagonal 1 LCx large and free of disease 60 to 70% stenosis of the proximal RCA LVEDP normal Intervention deferred; recommend medical therapy   LEXISCAN performed on 10/05/2020 LVEF 25-30% Global hypokinesis Artifacts noted Left ventricular cavity size enlarged Significant defect in the inferior region relatively persistent with no clear evidence of redistribution  TRANSTHORACIC ECHOCARDIOGRAM performed on 06/22/2020 Left ventricular cavity size normal with mildly increased wall thickness Left ventricular systolic function normal with an EF of 60-65% There is grade 1 diastolic dysfunction (impaired relaxation) Aortic valve is trileaflet with mildly thickened leaflets with normal excursion The right ventricle is normal in size with normal systolic function  Impression and Plan:  Carla [name] has been referred for pre-anesthesia review and clearance prior to her undergoing the planned anesthetic and procedural courses. Available labs, pertinent testing, and imaging results were personally reviewed by me. This patient has been appropriately cleared by cardiology with an overall MODERATE risk of significant perioperative cardiovascular complications.  Based on clinical review performed today (06/21/21), barring any significant acute changes in the patient's overall condition, it is anticipated that she will be able to proceed with the planned surgical intervention. Any acute changes in clinical condition may necessitate her procedure being postponed and/or cancelled. Patient will meet with anesthesia team (MD and/or CRNA) on the day of her procedure for  preoperative evaluation/assessment. Questions regarding anesthetic course will be fielded at that time.   Pre-surgical instructions were reviewed with the patient during her PAT appointment and questions were fielded by PAT clinical staff. Patient was advised that if any questions or concerns arise prior to her procedure then  she should return a call to PAT and/or her surgeon's office to discuss.  Honor Loh, MSN, APRN, FNP-C, CEN [facility]  Peri-operative Services Nurse Practitioner Phone: [phone] Fax: [phone] 06/21/21 1:10 PM  NOTE: This note has been prepared using Dragon dictation software. Despite my best ability to proofread, there is always the potential that unintentional transcriptional errors may still occur from this process.

## 2021-06-22 ENCOUNTER — Other Ambulatory Visit: Payer: Self-pay

## 2021-06-22 ENCOUNTER — Encounter: Payer: Self-pay | Admitting: Surgery

## 2021-06-22 ENCOUNTER — Ambulatory Visit (INDEPENDENT_AMBULATORY_CARE_PROVIDER_SITE_OTHER): Payer: Medicare Other | Admitting: Surgery

## 2021-06-22 ENCOUNTER — Ambulatory Visit: Payer: Medicare Other | Admitting: Surgery

## 2021-06-22 VITALS — BP 136/90 | HR 120 | Temp 98.3°F | Ht 62.0 in | Wt 234.4 lb

## 2021-06-22 DIAGNOSIS — K432 Incisional hernia without obstruction or gangrene: Secondary | ICD-10-CM | POA: Diagnosis not present

## 2021-06-22 MED ORDER — ALPRAZOLAM 0.5 MG PO TABS: 0.5000 mg | ORAL_TABLET | Freq: Two times a day (BID) | ORAL | 0 refills | Status: DC | PRN

## 2021-06-22 NOTE — Progress Notes (Signed)
Outpatient Surgical Follow Up  06/22/2021  Carla [name] is an 60 y.o. female.   Chief Complaint  Patient presents with   Follow-up    Ventral hernia    HPI: 60 year old female well-known to me with a history of severely symptomatic incisional hernia with loss of domain.  In addition to this she has multiple comorbidities including morbid obesity, chronic pain, peripheral vascular disease requiring multiple interventions.  More recently yesterday she underwent a cardiac cath per cardiology recommendations.  Cardiac cath did not find any evidence of a stent tubal lesions or significantly low limiting coronary lesions that would merit either a stent or CABG surgery.   Continues to endorse severe abdominal pain and she is miserable with this.  She is fortunate that this affects her quality of life and is affecting her daily. She is desperate and keeps coming for help.  She is willing to take any risks in order to alleviate her pain and suffering. She has quit smoking for the last 3 weeks or so.   Past Medical History:  Diagnosis Date   Abdominal aortic atherosclerosis ([place])    a. 05/2017 CTA abd/pelvis: significant atherosclerotic dzs of the infrarenal abd Ao w/ some mural thrombus. No aneurysm or dissection.   Acute focal ischemia of small intestine (HCC)    Acute right-sided low back pain with right-sided sciatica 06/13/2017   AKI (acute kidney injury) ([place]) 07/29/2017   Anginal pain ([place])    a. 08/2012 Lexiscan MV: EF 54%, non ischemia/infarct.   Anxiety and depression    Baker's cyst of knee, right    a. 07/2016 U/S: 4.1 x 1.4 x 2.9 cystic structure in R poplitetal fossa.   Bell's palsy    Borderline diabetes    CHF (congestive heart failure) (HCC)    Chronic anticoagulation    apixaban   Chronic idiopathic constipation    Chronic mesenteric ischemia (HCC)    Chronic pain    COPD (chronic obstructive pulmonary disease) (HCC)    Diastolic dysfunction    a. 05/2017 Echo: Ef  60-65%, no rwma, Gr1 DD, no source of cardiac emboli.   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 05/2017 CTA Abd/pelvis: apparent thrombus or embolus in prox SMA (70-90%); b. 05/2017 catheter directed tPA, mechanical thrombectomy, and stenting of the SMA.   Essential hypertension with goal blood pressure less than 140/90 01/19/2016   Gastroesophageal reflux disease 01/19/2016   GERD (gastroesophageal reflux disease)    H/O colectomy    History of 2019 novel coronavirus disease (COVID-19) 11/14/2019   History of kidney stones    Hyperlipidemia    Hypertension    Morbid obesity with BMI of 40.0-44.9, adult ([place])    Occlusive mesenteric ischemia ([place]) 06/19/2017   Osteoarthritis    PAD (peripheral artery disease) (HCC)    PAF (paroxysmal atrial fibrillation) (HCC)    Palpitations    Peripheral edema    Personal history of other malignant neoplasm of skin 06/01/2016   Pneumonia    Pre-diabetes    Primary osteoarthritis of both knees 06/13/2017   SBO (small bowel obstruction) ([place]) 08/06/2017   Skin cancer of nose    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HCC)    Vitamin D deficiency     Past Surgical History:  Procedure Laterality Date   AMPUTATION TOE Left 12/09/2019   Procedure: AMPUTATION TOE MPJ left;  Surgeon: Caroline More, DPM;  Location: [facility];  Service: Podiatry;  Laterality: Left;  APPENDECTOMY     CHOLECYSTECTOMY     COLON SURGERY     EMBOLECTOMY OR THROMBECTOMY, WITH OR WITHOUT CATHETER; FEMOROPOPLITEAL, AORTOILIAC ARTERY, BY LEG INCISION N/A 06/21/2020   Left - Decomp Fasciotomy Leg; Ant &/Or Lat Compart Only; Midline - Angiography, Visceral, Selective Or Supraselective (With Or Without Flush Aortogram); Left - Revascularize, Endovasc, Open/Percut, Iliac Artery, Unilat, Initial Vessel; W/Translum Stent, W/Angioplasty; Right - Revascularize, Endovasc, Open/Percut, Iliac Artery, Ea Add`L Ipsilateral; W/Translumin Stent, W/Angioplasty; Location [facility];    LAPAROTOMY N/A 08/08/2017   Procedure: EXPLORATORY LAPAROTOMY POSSIBLE BOWEL RESECTION;  Surgeon: Jules Husbands, MD;  Location: [facility];  Service: General;  Laterality: N/A;   LEFT HEART CATH AND CORONARY ANGIOGRAPHY N/A 05/17/2021   Procedure: LEFT HEART CATH AND CORONARY ANGIOGRAPHY;  Surgeon: Yolonda Kida, MD;  Location: [place] [facility];  Service: Cardiovascular;  Laterality: N/A;   LOWER EXTREMITY ANGIOGRAPHY Left 11/17/2019   Procedure: LOWER EXTREMITY ANGIOGRAPHY;  Surgeon: Algernon Huxley, MD;  Location: [place] [facility];  Service: Cardiovascular;  Laterality: Left;   LOWER EXTREMITY ANGIOGRAPHY Right 12/13/2020   Procedure: LOWER EXTREMITY ANGIOGRAPHY;  Surgeon: Algernon Huxley, MD;  Location: [place] [facility];  Service: Cardiovascular;  Laterality: Right;   LOWER EXTREMITY ANGIOGRAPHY Right 01/10/2021   Procedure: LOWER EXTREMITY ANGIOGRAPHY;  Surgeon: Algernon Huxley, MD;  Location: [place] [facility];  Service: Cardiovascular;  Laterality: Right;   LOWER EXTREMITY ANGIOGRAPHY Right 01/24/2021   Procedure: LOWER EXTREMITY ANGIOGRAPHY;  Surgeon: Algernon Huxley, MD;  Location: [place] [facility];  Service: Cardiovascular;  Laterality: Right;   LOWER EXTREMITY INTERVENTION N/A 11/19/2019   Procedure: LOWER EXTREMITY INTERVENTION;  Surgeon: Algernon Huxley, MD;  Location: [place] [facility];  Service: Cardiovascular;  Laterality: N/A;   TEE WITHOUT CARDIOVERSION N/A 06/22/2017   Procedure: TRANSESOPHAGEAL ECHOCARDIOGRAM (TEE);  Surgeon: Wellington Hampshire, MD;  Location: [facility];  Service: Cardiovascular;  Laterality: N/A;   TOTAL KNEE ARTHROPLASTY Right 07/11/2018   Procedure: TOTAL KNEE ARTHROPLASTY;  Surgeon: Corky Mull, MD;  Location: [facility];  Service: Orthopedics;  Laterality: Right;   VAGINAL HYSTERECTOMY     VISCERAL ARTERY INTERVENTION N/A 06/20/2017   Procedure: Visceral Artery Intervention, possible aortic thrombectomy;  Surgeon: Algernon Huxley, MD;  Location: [place] [facility];  Service: Cardiovascular;  Laterality: N/A;   VISCERAL ARTERY INTERVENTION N/A 01/28/2018   Procedure: VISCERAL ARTERY INTERVENTION;  Surgeon: Algernon Huxley, MD;  Location: [place] [facility];  Service: Cardiovascular;  Laterality: N/A;    Family History  Problem Relation Age of Onset   Hypertension Mother    Heart disease Mother    Heart attack Mother    Breast cancer Mother    Hypertension Father    Breast cancer Paternal Aunt     Social History:  reports that she has been smoking cigarettes. She has been smoking an average of .1 packs per day. She has never used smokeless tobacco. She reports that she does not drink alcohol and does not use drugs.  Allergies:  Allergies  Allergen Reactions   Gabapentin     Tremors    Bactrim [Sulfamethoxazole-Trimethoprim] Itching    Medications reviewed.    ROS Full ROS performed and is otherwise negative other than what is stated in HPI   BP 136/90   Pulse (!) 120   Temp 98.3 F (36.8 C) (Oral)   Ht '5\' 2"'$  (1.575 m)   Wt 234 lb 6.4 oz (106.3  kg)   SpO2 95%   BMI 42.87 kg/m   Physical Exam Vitals and nursing note reviewed. Exam conducted with a chaperone present.  Constitutional:      Appearance: She is obese.  Cardiovascular:     Rate and Rhythm: Normal rate and regular rhythm.     Heart sounds: No murmur heard. Pulmonary:     Effort: Pulmonary effort is normal. No respiratory distress.     Breath sounds: Normal breath sounds. No stridor.  Abdominal:     Palpations: Abdomen is soft.     Tenderness: There is abdominal tenderness. There is no guarding or rebound.     Hernia: A hernia is present.     Comments: There is evidence of a chronically incarcerated ventral hernia mostly to the right side.  I am unable to reduce it.  It is tender to palpation.  There is no peritonitis there is loss of domain  Musculoskeletal:        General: Normal range of motion.     Cervical back: Normal range of motion and  neck supple. No rigidity or tenderness.  Skin:    General: Skin is warm.     Capillary Refill: Capillary refill takes less than 2 seconds.  Neurological:     General: No focal deficit present.     Mental Status: She is alert and oriented to person, place, and time.  Psychiatric:        Mood and Affect: Mood normal.        Behavior: Behavior normal.        Thought Content: Thought content normal.        Judgment: Judgment normal.        Assessment/Plan:  Takoda is a 60 year old female well-known to me with history of recurrent ventral hernia with loss of domain.  She has quit smoking and has been optimized as March as we can expect from her.  I have been seeing her regarding this issue for over 1 year.  I have tried absolutely every measure that I can to help with risk reduction of risk factors.  However I do not think that we are going to get her much optimized as she currently is right now.  I do think that we need to serous the opportunity since she has made the effort to quit smoking and proceed with abdominal wall reconstruction.  She fully understands that she is a high risk for any potential complications to include bowel injuries, wound infection, mesh infection and cardiovascular complications.  She is anticoagulated and we will hold anticoagulation for 48 hours and the day of surgery will certainly do heparin.  I do expect that heparin drip will be needed in the immediate postoperative period.  I have had an extensive discussion with both the patient and her daughter who fully understand the situation.  Unfortunately this is a very difficult case without any good answers.  Her quality of life continues to be impaired due to this large ventral hernia. She is very anxious and I have agreed to prescribe her a short course of xanax until operative intervention  8  pill prescription filled. Greater than 50% of the 40 minutes  visit was spent in counseling/coordination of care   Caroleen Hamman, MD [facility]

## 2021-06-22 NOTE — Patient Instructions (Signed)
If you have any concerns or questions, please feel free to call our office.  Laparoscopic Ventral Hernia Repair, Care After The following information offers guidance on how to care for yourself after your procedure. Your health care provider may also give you more specific instructions. If you have problems or questions, contact your health care provider. What can I expect after the procedure? After the procedure, it is common to have pain, discomfort, or soreness. Follow these instructions at home: Medicines Take over-the-counter and prescription medicines only as told by your health care provider. Ask your health care provider if the medicine prescribed to you: Requires you to avoid driving or using machinery. 4Can cause constipation. You may need to take these actions to prevent or treat constipation: Drink enough fluid to keep your urine pale yellow. Take over-the-counter or prescription medicines. Eat foods that are high in fiber, such as beans, whole grains, and fresh fruits and vegetables. Limit foods that are high in fat and processed sugars, such as fried or sweet foods. Incision care  Follow instructions from your health care provider about how to take care of your incisions. Make sure you: Wash your hands with soap and water for at least 20 seconds before and after you change your bandage (dressing) or before you touch your abdomen. If soap and water are not available, use hand sanitizer. Change your dressing as told by your health care provider. Leave stitches (sutures), skin glue, or adhesive strips in place. These skin closures may need to stay in place for 2 weeks or longer. If adhesive strip edges start to loosen and curl up, you may trim the loose edges. Do not remove adhesive strips completely unless your health care provider tells you to do that. Check your incision areas every day for signs of infection. Check for: More redness, swelling, or pain. Fluid or  blood. Warmth. Pus or a bad smell. Bathing  Do not take baths, swim, or use a hot tub until your health care provider approves. Ask your health care provider if you may take showers. You may only be allowed to take sponge baths. Keep your dressing dry until your health care provider says it can be removed. Activity  Rest as told by your health care provider. Avoid sitting for a long time without moving. Get up to take short walks every 1-2 hours. This is important to improve blood flow and breathing. Ask for help if you feel weak or unsteady. Do not lift anything that is heavier than 10 lb (4.5 kg), or the limit that you are told, until your health care provider says that it is safe. If you were given a sedative during the procedure, it can affect you for several hours. Do not drive or operate machinery until your health care provider says that it is safe. Return to your normal activities as told by your health care provider. Ask your health care provider what activities are safe for you. General instructions  Hold a pillow over your abdomen when you cough or sneeze. This helps with pain. Do not use any products that contain nicotine or tobacco. These products include cigarettes, chewing tobacco, and vaping devices, such as e-cigarettes. These can delay healing after surgery. If you need help quitting, ask your health care provider. You may be asked to continue to do deep breathing exercises at home. This will help to prevent a lung infection. Keep all follow-up visits. This is important. Contact a health care provider if: You have any of  these signs of infection: More redness, swelling, or pain around an incision. Fluid or blood coming from an incision. Warmth coming from an incision. Pus or a bad smell coming from an incision. A fever or chills. You have pain that gets worse or does not get better with medicine. You have nausea or vomiting. You have a cough. You have shortness of  breath. You have not had a bowel movement in 3 days. You are not able to urinate. Get help right away if you have: Severe pain in your abdomen. Persistent nausea and vomiting. Redness, warmth, or pain in your leg. Chest pain. Trouble breathing. These symptoms may represent a serious problem that is an emergency. Do not wait to see if the symptoms will go away. Get medical help right away. Call your local emergency services (911 in the [place] Do not drive yourself to the hospital. Summary After this procedure, it is common to have pain, discomfort, or soreness. Follow instructions from your health care provider about how to take care of your incision. Check your incision area every day for signs of infection. Report any signs of infection to your health care provider. Keep all follow-up visits. This is important. This information is not intended to replace advice given to you by your health care provider. Make sure you discuss any questions you have with your health care provider. Document Revised: 05/28/2020 Document Reviewed: 05/28/2020 Elsevier Patient Education  [place].

## 2021-06-24 ENCOUNTER — Other Ambulatory Visit
Admission: RE | Admit: 2021-06-24 | Discharge: 2021-06-24 | Disposition: A | Discharge status: Home / Self Care | Payer: Medicare Other | Source: Ambulatory Visit | Attending: Surgery | Admitting: Surgery

## 2021-06-24 ENCOUNTER — Other Ambulatory Visit: Payer: Self-pay

## 2021-06-24 ENCOUNTER — Ambulatory Visit (INDEPENDENT_AMBULATORY_CARE_PROVIDER_SITE_OTHER): Payer: Medicare Other | Admitting: Podiatry

## 2021-06-24 DIAGNOSIS — M79674 Pain in right toe(s): Secondary | ICD-10-CM

## 2021-06-24 DIAGNOSIS — M76821 Posterior tibial tendinitis, right leg: Secondary | ICD-10-CM | POA: Diagnosis not present

## 2021-06-24 DIAGNOSIS — T82868A Thrombosis of vascular prosthetic devices, implants and grafts, initial encounter: Secondary | ICD-10-CM | POA: Diagnosis not present

## 2021-06-24 DIAGNOSIS — M79675 Pain in left toe(s): Secondary | ICD-10-CM

## 2021-06-24 DIAGNOSIS — Z20822 Contact with and (suspected) exposure to covid-19: Secondary | ICD-10-CM | POA: Insufficient documentation

## 2021-06-24 DIAGNOSIS — M76822 Posterior tibial tendinitis, left leg: Secondary | ICD-10-CM | POA: Diagnosis not present

## 2021-06-24 DIAGNOSIS — B351 Tinea unguium: Secondary | ICD-10-CM

## 2021-06-24 DIAGNOSIS — Z01812 Encounter for preprocedural laboratory examination: Secondary | ICD-10-CM | POA: Insufficient documentation

## 2021-06-24 LAB — SARS CORONAVIRUS 2 (TAT 6-24 HRS): SARS Coronavirus 2: NEGATIVE

## 2021-06-24 MED ORDER — BETAMETHASONE SOD PHOS & ACET 6 (3-3) MG/ML IJ SUSP
3.0000 mg | Freq: Once | INTRAMUSCULAR | Status: AC
  Administered 2021-06-24: 3 mg via INTRA_ARTICULAR

## 2021-06-24 NOTE — Progress Notes (Signed)
SUBJECTIVE Patient presents to office today complaining of elongated, thickened nails that cause pain while ambulating in shoes.  She is unable to trim her own nails.  Patient states that most recently she has been having some ankle pain bilateral.  She like to have an injection to see if it helps alleviate any of her pain.  She denies a history of injury.  She has not done anything for treatment.  Past Medical History:  Diagnosis Date   Abdominal aortic atherosclerosis ([place])    a. 05/2017 CTA abd/pelvis: significant atherosclerotic dzs of the infrarenal abd Ao w/ some mural thrombus. No aneurysm or dissection.   Acute focal ischemia of small intestine (HCC)    Acute right-sided low back pain with right-sided sciatica 06/13/2017   AKI (acute kidney injury) ([place]) 07/29/2017   Anginal pain ([place])    a. 08/2012 Lexiscan MV: EF 54%, non ischemia/infarct.   Anxiety and depression    Baker's cyst of knee, right    a. 07/2016 U/S: 4.1 x 1.4 x 2.9 cystic structure in R poplitetal fossa.   Bell's palsy    Borderline diabetes    CHF (congestive heart failure) (HCC)    Chronic anticoagulation    apixaban   Chronic idiopathic constipation    Chronic mesenteric ischemia (HCC)    Chronic pain    COPD (chronic obstructive pulmonary disease) (HCC)    Diastolic dysfunction    a. 05/2017 Echo: Ef 60-65%, no rwma, Gr1 DD, no source of cardiac emboli.   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 05/2017 CTA Abd/pelvis: apparent thrombus or embolus in prox SMA (70-90%); b. 05/2017 catheter directed tPA, mechanical thrombectomy, and stenting of the SMA.   Essential hypertension with goal blood pressure less than 140/90 01/19/2016   Gastroesophageal reflux disease 01/19/2016   GERD (gastroesophageal reflux disease)    H/O colectomy    History of 2019 novel coronavirus disease (COVID-19) 11/14/2019   History of kidney stones    Hyperlipidemia    Hypertension    Morbid obesity with BMI  of 40.0-44.9, adult ([place])    Occlusive mesenteric ischemia ([place]) 06/19/2017   Osteoarthritis    PAD (peripheral artery disease) (HCC)    PAF (paroxysmal atrial fibrillation) (HCC)    Palpitations    Peripheral edema    Personal history of other malignant neoplasm of skin 06/01/2016   Pneumonia    Pre-diabetes    Primary osteoarthritis of both knees 06/13/2017   SBO (small bowel obstruction) ([place]) 08/06/2017   Skin cancer of nose    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HCC)    Vitamin D deficiency     OBJECTIVE General Patient is awake, alert, and oriented x 3 and in no acute distress. Derm Skin is dry and supple bilateral. Negative open lesions or macerations. Remaining integument unremarkable. Nails are tender, long, thickened and dystrophic with subungual debris, consistent with onychomycosis, 1-5 bilateral. No signs of infection noted. Vasc  DP and PT pedal pulses palpable to the right lower extremity however they are diminished to the left secondary to the chronic edema. Temperature gradient within normal limits.  History of saphenous vein harvesting stripping LLE Neuro Epicritic and protective threshold sensation diminished bilaterally.  Left greater than right. Musculoskeletal Exam history of toe amputation left third toe due to ischemia.  Pain on palpation along the posterior tibial tendon sheath bilateral  ASSESSMENT 1.  Pain due to onychomycosis of toenails both 2.  Peripheral vascular disease  left lower extremity 3.  Chronic left lower extremity edema 4.  Posterior tibial tendinitis bilateral  PLAN OF CARE 1. Patient evaluated today.  2. Instructed to maintain good pedal hygiene and foot care.  3. Mechanical debridement of nails 1-5 bilaterally performed using a nail nipper. Filed with dremel without incident.  4.  Injection of 0.5 cc Celestone Soluspan injected into the posterior tibial tendon sheath just posterior to the medial malleolus bilateral 5.  Return to clinic  3 months  Edrick Kins, DPM [facility]  Dr. Edrick Kins, DPM    [address], [place] [address]                Office [phone]  Fax [phone]

## 2021-06-26 ENCOUNTER — Inpatient Hospital Stay
Admission: EM | Admit: 2021-06-26 | Discharge: 2021-07-01 | DRG: 271 | Disposition: A | Discharge status: Home / Self Care | Payer: Medicare Other | Attending: Internal Medicine | Admitting: Internal Medicine

## 2021-06-26 DIAGNOSIS — Z8249 Family history of ischemic heart disease and other diseases of the circulatory system: Secondary | ICD-10-CM

## 2021-06-26 DIAGNOSIS — Z7901 Long term (current) use of anticoagulants: Secondary | ICD-10-CM

## 2021-06-26 DIAGNOSIS — E1151 Type 2 diabetes mellitus with diabetic peripheral angiopathy without gangrene: Secondary | ICD-10-CM | POA: Diagnosis present

## 2021-06-26 DIAGNOSIS — F17210 Nicotine dependence, cigarettes, uncomplicated: Secondary | ICD-10-CM | POA: Diagnosis present

## 2021-06-26 DIAGNOSIS — I480 Paroxysmal atrial fibrillation: Secondary | ICD-10-CM

## 2021-06-26 DIAGNOSIS — Z882 Allergy status to sulfonamides status: Secondary | ICD-10-CM

## 2021-06-26 DIAGNOSIS — E7800 Pure hypercholesterolemia, unspecified: Secondary | ICD-10-CM | POA: Diagnosis present

## 2021-06-26 DIAGNOSIS — Z803 Family history of malignant neoplasm of breast: Secondary | ICD-10-CM

## 2021-06-26 DIAGNOSIS — Z7982 Long term (current) use of aspirin: Secondary | ICD-10-CM

## 2021-06-26 DIAGNOSIS — Z20822 Contact with and (suspected) exposure to covid-19: Secondary | ICD-10-CM | POA: Diagnosis present

## 2021-06-26 DIAGNOSIS — E1142 Type 2 diabetes mellitus with diabetic polyneuropathy: Secondary | ICD-10-CM | POA: Diagnosis present

## 2021-06-26 DIAGNOSIS — K55069 Acute infarction of intestine, part and extent unspecified: Secondary | ICD-10-CM | POA: Diagnosis present

## 2021-06-26 DIAGNOSIS — I739 Peripheral vascular disease, unspecified: Secondary | ICD-10-CM | POA: Diagnosis present

## 2021-06-26 DIAGNOSIS — I700 Atherosclerosis of aorta: Secondary | ICD-10-CM | POA: Diagnosis present

## 2021-06-26 DIAGNOSIS — Z95820 Peripheral vascular angioplasty status with implants and grafts: Secondary | ICD-10-CM

## 2021-06-26 DIAGNOSIS — I10 Essential (primary) hypertension: Secondary | ICD-10-CM | POA: Diagnosis present

## 2021-06-26 DIAGNOSIS — T82868A Thrombosis of vascular prosthetic devices, implants and grafts, initial encounter: Principal | ICD-10-CM | POA: Diagnosis present

## 2021-06-26 DIAGNOSIS — Z85828 Personal history of other malignant neoplasm of skin: Secondary | ICD-10-CM

## 2021-06-26 DIAGNOSIS — I70221 Atherosclerosis of native arteries of extremities with rest pain, right leg: Secondary | ICD-10-CM | POA: Diagnosis present

## 2021-06-26 DIAGNOSIS — M5441 Lumbago with sciatica, right side: Secondary | ICD-10-CM | POA: Diagnosis present

## 2021-06-26 DIAGNOSIS — M79671 Pain in right foot: Secondary | ICD-10-CM | POA: Diagnosis present

## 2021-06-26 DIAGNOSIS — K5904 Chronic idiopathic constipation: Secondary | ICD-10-CM | POA: Diagnosis present

## 2021-06-26 DIAGNOSIS — K219 Gastro-esophageal reflux disease without esophagitis: Secondary | ICD-10-CM | POA: Diagnosis present

## 2021-06-26 DIAGNOSIS — I5032 Chronic diastolic (congestive) heart failure: Secondary | ICD-10-CM | POA: Diagnosis present

## 2021-06-26 DIAGNOSIS — J449 Chronic obstructive pulmonary disease, unspecified: Secondary | ICD-10-CM | POA: Diagnosis present

## 2021-06-26 DIAGNOSIS — Z6841 Body Mass Index (BMI) 40.0 and over, adult: Secondary | ICD-10-CM

## 2021-06-26 DIAGNOSIS — F32A Depression, unspecified: Secondary | ICD-10-CM | POA: Diagnosis present

## 2021-06-26 DIAGNOSIS — I7090 Unspecified atherosclerosis: Secondary | ICD-10-CM

## 2021-06-26 DIAGNOSIS — Z96651 Presence of right artificial knee joint: Secondary | ICD-10-CM | POA: Diagnosis present

## 2021-06-26 DIAGNOSIS — Z8616 Personal history of COVID-19: Secondary | ICD-10-CM

## 2021-06-26 DIAGNOSIS — F419 Anxiety disorder, unspecified: Secondary | ICD-10-CM | POA: Diagnosis present

## 2021-06-26 DIAGNOSIS — I998 Other disorder of circulatory system: Secondary | ICD-10-CM | POA: Diagnosis present

## 2021-06-26 DIAGNOSIS — Z79899 Other long term (current) drug therapy: Secondary | ICD-10-CM

## 2021-06-26 DIAGNOSIS — K55019 Acute (reversible) ischemia of small intestine, extent unspecified: Secondary | ICD-10-CM | POA: Diagnosis present

## 2021-06-26 DIAGNOSIS — I248 Other forms of acute ischemic heart disease: Secondary | ICD-10-CM | POA: Diagnosis present

## 2021-06-26 DIAGNOSIS — Z888 Allergy status to other drugs, medicaments and biological substances status: Secondary | ICD-10-CM

## 2021-06-26 DIAGNOSIS — K439 Ventral hernia without obstruction or gangrene: Secondary | ICD-10-CM | POA: Diagnosis present

## 2021-06-26 DIAGNOSIS — I743 Embolism and thrombosis of arteries of the lower extremities: Secondary | ICD-10-CM | POA: Diagnosis present

## 2021-06-26 DIAGNOSIS — Z9049 Acquired absence of other specified parts of digestive tract: Secondary | ICD-10-CM

## 2021-06-26 DIAGNOSIS — M170 Bilateral primary osteoarthritis of knee: Secondary | ICD-10-CM | POA: Diagnosis present

## 2021-06-26 DIAGNOSIS — K436 Other and unspecified ventral hernia with obstruction, without gangrene: Secondary | ICD-10-CM | POA: Diagnosis present

## 2021-06-26 DIAGNOSIS — Z89422 Acquired absence of other left toe(s): Secondary | ICD-10-CM

## 2021-06-26 DIAGNOSIS — K56609 Unspecified intestinal obstruction, unspecified as to partial versus complete obstruction: Secondary | ICD-10-CM | POA: Diagnosis present

## 2021-06-26 DIAGNOSIS — Z9114 Patient's other noncompliance with medication regimen: Secondary | ICD-10-CM

## 2021-06-26 LAB — BASIC METABOLIC PANEL
Anion gap: 9 (ref 5–15)
BUN: 21 mg/dL (ref 8–23)
CO2: 24 mmol/L (ref 22–32)
Calcium: 8.4 mg/dL — ABNORMAL LOW (ref 8.9–10.3)
Chloride: 107 mmol/L (ref 98–111)
Creatinine, Ser: 0.95 mg/dL (ref 0.44–1.00)
GFR, Estimated: >60 mL/min (ref >60)
Glucose, Bld: 114 mg/dL — ABNORMAL HIGH (ref 70–99)
Potassium: 3.8 mmol/L (ref 3.5–5.1)
Sodium: 140 mmol/L (ref 135–145)

## 2021-06-26 LAB — CBC WITH DIFFERENTIAL/PLATELET
Abs Immature Granulocytes: 0.1 10*3/uL — ABNORMAL HIGH (ref 0.00–0.07)
Basophils Absolute: 0 10*3/uL (ref 0.0–0.1)
Basophils Relative: 0 %
Eosinophils Absolute: 0.1 10*3/uL (ref 0.0–0.5)
Eosinophils Relative: 1 %
HCT: 39.7 % (ref 36.0–46.0)
Hemoglobin: 13.1 g/dL (ref 12.0–15.0)
Immature Granulocytes: 1 %
Lymphocytes Relative: 22 %
Lymphs Abs: 2.4 10*3/uL (ref 0.7–4.0)
MCH: 30.3 pg (ref 26.0–34.0)
MCHC: 33 g/dL (ref 30.0–36.0)
MCV: 91.9 fL (ref 80.0–100.0)
Monocytes Absolute: 0.7 10*3/uL (ref 0.1–1.0)
Monocytes Relative: 6 %
Neutro Abs: 7.7 10*3/uL (ref 1.7–7.7)
Neutrophils Relative %: 70 %
Platelets: 200 10*3/uL (ref 150–400)
RBC: 4.32 MIL/uL (ref 3.87–5.11)
RDW: 16 % — ABNORMAL HIGH (ref 11.5–15.5)
WBC: 11 10*3/uL — ABNORMAL HIGH (ref 4.0–10.5)
nRBC: 0 % (ref 0.0–0.2)

## 2021-06-26 MED ORDER — FENTANYL CITRATE PF 50 MCG/ML IJ SOSY
50.0000 ug | PREFILLED_SYRINGE | Freq: Once | INTRAMUSCULAR | Status: AC
  Administered 2021-06-26: 50 ug via INTRAVENOUS
  Filled 2021-06-26: qty 1

## 2021-06-26 NOTE — ED Triage Notes (Signed)
Pt presents to the ED complaints of right lower leg pain since this morning. Pt known to have significant history of blood clots and has recently been taking off her blood thinners for surgery on Tuesday.

## 2021-06-26 NOTE — ED Provider Notes (Signed)
[facility] Emergency Department Provider Note   ____________________________________________   I have reviewed the triage vital signs and the nursing notes.   HISTORY  Chief Complaint Leg Pain   History limited by: Not Limited   HPI Carla [name] is a 60 y.o. female who presents to the emergency department today because of concerns for right leg pain and possible blood clot.  Patient states she has history of multiple blood clots and stents in her legs.  She says that she recently stopped her Eliquis because of a planned surgery in 2 days for an abdominal hernia.  She states that the pain in her legs started today.  Patient denies any trauma to her leg.  She denies any chest pain or shortness of breath.   Records reviewed. Per medical record review patient has a history of arterial thrombus  Past Medical History:  Diagnosis Date   Abdominal aortic atherosclerosis ([place])    a. 05/2017 CTA abd/pelvis: significant atherosclerotic dzs of the infrarenal abd Ao w/ some mural thrombus. No aneurysm or dissection.   Acute focal ischemia of small intestine (HCC)    Acute right-sided low back pain with right-sided sciatica 06/13/2017   AKI (acute kidney injury) ([place]) 07/29/2017   Anginal pain ([place])    a. 08/2012 Lexiscan MV: EF 54%, non ischemia/infarct.   Anxiety and depression    Baker's cyst of knee, right    a. 07/2016 U/S: 4.1 x 1.4 x 2.9 cystic structure in R poplitetal fossa.   Bell's palsy    Borderline diabetes    CHF (congestive heart failure) (HCC)    Chronic anticoagulation    apixaban   Chronic idiopathic constipation    Chronic mesenteric ischemia (HCC)    Chronic pain    COPD (chronic obstructive pulmonary disease) (HCC)    Diastolic dysfunction    a. 05/2017 Echo: Ef 60-65%, no rwma, Gr1 DD, no source of cardiac emboli.   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 05/2017 CTA Abd/pelvis: apparent thrombus or embolus in prox  SMA (70-90%); b. 05/2017 catheter directed tPA, mechanical thrombectomy, and stenting of the SMA.   Essential hypertension with goal blood pressure less than 140/90 01/19/2016   Gastroesophageal reflux disease 01/19/2016   GERD (gastroesophageal reflux disease)    H/O colectomy    History of 2019 novel coronavirus disease (COVID-19) 11/14/2019   History of kidney stones    Hyperlipidemia    Hypertension    Morbid obesity with BMI of 40.0-44.9, adult ([place])    Occlusive mesenteric ischemia ([place]) 06/19/2017   Osteoarthritis    PAD (peripheral artery disease) (HCC)    PAF (paroxysmal atrial fibrillation) (HCC)    Palpitations    Peripheral edema    Personal history of other malignant neoplasm of skin 06/01/2016   Pneumonia    Pre-diabetes    Primary osteoarthritis of both knees 06/13/2017   SBO (small bowel obstruction) ([place]) 08/06/2017   Skin cancer of nose    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HCC)    Vitamin D deficiency     Patient Active Problem List   Diagnosis Date Noted   PAD (peripheral artery disease) ([place]) 05/03/2021   Ischemia of right lower extremity 01/24/2021   Tobacco abuse counseling 10/26/2020   Smoker 10/26/2020   Chronic diastolic CHF (congestive heart failure) ([place]) 10/06/2020   Neuropathic pain 08/19/2020   Bursitis of right shoulder 12/01/2019   Chronic thumb pain, right 12/01/2019  Primary osteoarthritis involving multiple joints 12/01/2019   Ischemia of left lower extremity 11/17/2019   Carpal tunnel syndrome, right 11/10/2019   Health care maintenance 03/26/2019   Disorder of skeletal system 03/26/2019   Problems influencing health status 03/26/2019   Chronic low back pain (Secondary area of Pain) (Bilateral) w/o sciatica 03/26/2019   Rib pain on left side 03/25/2019   Aortic atherosclerosis ([place]) 09/12/2018   COPD (chronic obstructive pulmonary disease) ([place]) 09/12/2018   Moderate episode of recurrent major depressive disorder ([place]) 09/12/2018    Lumbar spondylosis 09/06/2018   History of total knee replacement (Right) 07/11/2018   Ventral hernia with obstruction and without gangrene 07/02/2018   Paroxysmal atrial fibrillation ([place]) 06/15/2018   Accelerated hypertension 04/08/2018   Acute encephalopathy 04/08/2018   DDD (degenerative disc disease), lumbosacral 09/25/2017   Osteoarthritis of knee 09/25/2017   Adjustment disorder with anxiety 08/15/2017   Chronic idiopathic constipation    Protein-calorie malnutrition, severe 08/08/2017   SBO (small bowel obstruction) ([place]) 08/06/2017   Chronic mesenteric ischemia (HCC)    AKI (acute kidney injury) ([place]) 07/29/2017   Hypotension 07/29/2017   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HCC)    Abdominal pain    Occlusive mesenteric ischemia ([place]) 06/19/2017   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HCC)    Acute focal ischemia of small intestine ([place])    Acute right-sided low back pain with right-sided sciatica 06/13/2017   Osteoarthritis of knee (Bilateral) 06/13/2017   Chronic pain syndrome 09/13/2016   Morbid obesity with BMI of 40.0-44.9, adult ([place]) 07/16/2016   Personal history of other malignant neoplasm of skin 06/01/2016   Chronic knee pain (Primary Area of Pain) (Bilateral) 01/19/2016   Essential hypertension with goal blood pressure less than 140/90 01/19/2016   Gastroesophageal reflux disease 01/19/2016   Hyperlipidemia 01/19/2016   Prediabetes 01/19/2016   Recurrent major depressive disorder ([place]) 01/19/2016   Urinary tract infection 01/09/2014   Chronic pelvic pain in female 11/14/2013   Mixed stress and urge urinary incontinence 11/14/2013   Chest pain 08/06/2012    Past Surgical History:  Procedure Laterality Date   AMPUTATION TOE Left 12/09/2019   Procedure: AMPUTATION TOE MPJ left;  Surgeon: Caroline More, DPM;  Location: [facility];  Service: Podiatry;  Laterality: Left;   APPENDECTOMY     CHOLECYSTECTOMY     COLON SURGERY     EMBOLECTOMY OR  THROMBECTOMY, WITH OR WITHOUT CATHETER; FEMOROPOPLITEAL, AORTOILIAC ARTERY, BY LEG INCISION N/A 06/21/2020   Left - Decomp Fasciotomy Leg; Ant &/Or Lat Compart Only; Midline - Angiography, Visceral, Selective Or Supraselective (With Or Without Flush Aortogram); Left - Revascularize, Endovasc, Open/Percut, Iliac Artery, Unilat, Initial Vessel; W/Translum Stent, W/Angioplasty; Right - Revascularize, Endovasc, Open/Percut, Iliac Artery, Ea Add`L Ipsilateral; W/Translumin Stent, W/Angioplasty; Location [facility];   LAPAROTOMY N/A 08/08/2017   Procedure: EXPLORATORY LAPAROTOMY POSSIBLE BOWEL RESECTION;  Surgeon: Jules Husbands, MD;  Location: [facility];  Service: General;  Laterality: N/A;   LEFT HEART CATH AND CORONARY ANGIOGRAPHY N/A 05/17/2021   Procedure: LEFT HEART CATH AND CORONARY ANGIOGRAPHY;  Surgeon: Yolonda Kida, MD;  Location: [place] [facility];  Service: Cardiovascular;  Laterality: N/A;   LOWER EXTREMITY ANGIOGRAPHY Left 11/17/2019   Procedure: LOWER EXTREMITY ANGIOGRAPHY;  Surgeon: Algernon Huxley, MD;  Location: [place] [facility];  Service: Cardiovascular;  Laterality: Left;   LOWER EXTREMITY ANGIOGRAPHY Right 12/13/2020   Procedure: LOWER EXTREMITY ANGIOGRAPHY;  Surgeon: Algernon Huxley, MD;  Location: [place] [facility];  Service: Cardiovascular;  Laterality:  Right;   LOWER EXTREMITY ANGIOGRAPHY Right 01/10/2021   Procedure: LOWER EXTREMITY ANGIOGRAPHY;  Surgeon: Algernon Huxley, MD;  Location: [place] [facility];  Service: Cardiovascular;  Laterality: Right;   LOWER EXTREMITY ANGIOGRAPHY Right 01/24/2021   Procedure: LOWER EXTREMITY ANGIOGRAPHY;  Surgeon: Algernon Huxley, MD;  Location: [facility];  Service: Cardiovascular;  Laterality: Right;   LOWER EXTREMITY INTERVENTION N/A 11/19/2019   Procedure: LOWER EXTREMITY INTERVENTION;  Surgeon: Algernon Huxley, MD;  Location: [place] [facility];  Service: Cardiovascular;  Laterality: N/A;   TEE WITHOUT CARDIOVERSION N/A 06/22/2017    Procedure: TRANSESOPHAGEAL ECHOCARDIOGRAM (TEE);  Surgeon: Wellington Hampshire, MD;  Location: [facility];  Service: Cardiovascular;  Laterality: N/A;   TOTAL KNEE ARTHROPLASTY Right 07/11/2018   Procedure: TOTAL KNEE ARTHROPLASTY;  Surgeon: Corky Mull, MD;  Location: [facility];  Service: Orthopedics;  Laterality: Right;   VAGINAL HYSTERECTOMY     VISCERAL ARTERY INTERVENTION N/A 06/20/2017   Procedure: Visceral Artery Intervention, possible aortic thrombectomy;  Surgeon: Algernon Huxley, MD;  Location: [place] [facility];  Service: Cardiovascular;  Laterality: N/A;   VISCERAL ARTERY INTERVENTION N/A 01/28/2018   Procedure: VISCERAL ARTERY INTERVENTION;  Surgeon: Algernon Huxley, MD;  Location: [place];  Service: Cardiovascular;  Laterality: N/A;    Prior to Admission medications   Medication Sig Start Date End Date Taking? Authorizing Provider  albuterol (VENTOLIN HFA) 108 (90 Base) MCG/ACT inhaler Inhale 2 puffs into the lungs every 6 (six) hours as needed for wheezing or shortness of breath. 09/03/19   [provider]  Alpha-Lipoic Acid 600 MG CAPS Take 1 capsule (600 mg total) by mouth daily. 06/16/21 07/16/21  Gillis Santa, MD  ALPRAZolam Duanne Moron) 0.5 MG tablet Take 1 tablet (0.5 mg total) by mouth 2 (two) times daily as needed for anxiety. 06/22/21   Pabon, Diego F, MD  amLODipine (NORVASC) 5 MG tablet Take 1 tablet (5 mg total) by mouth daily. 04/10/18   Gladstone Lighter, MD  apixaban (ELIQUIS) 5 MG TABS tablet Take 1 tablet (5 mg total) by mouth 2 (two) times daily. 01/25/21   Stegmayer, Janalyn Harder, PA-C  aspirin EC 81 MG tablet Take 81 mg by mouth daily. Swallow whole.    [provider]  atorvastatin (LIPITOR) 80 MG tablet Take 80 mg by mouth at bedtime.    [provider]  COMBIVENT RESPIMAT 20-100 MCG/ACT AERS respimat Inhale 1 puff into the lungs daily as needed for wheezing or shortness of breath. 04/06/21   [provider]  ferrous sulfate 325  (65 FE) MG tablet Take 325 mg by mouth daily.    [provider]  metoprolol tartrate (LOPRESSOR) 50 MG tablet Take 1 tablet (50 mg total) by mouth 2 (two) times daily. 06/15/18   Bettey Costa, MD  omeprazole (PRILOSEC) 20 MG capsule Take 20 mg by mouth 2 (two) times daily before a meal.    [provider]  pregabalin (LYRICA) 200 MG capsule Take 1 capsule (200 mg total) by mouth 2 (two) times daily. Patient taking differently: Take 100 mg by mouth 3 (three) times daily. 02/16/21   Pabon, Diego F, MD  varenicline (CHANTIX) 0.5 MG tablet Take 1 tablet (0.5 mg total) by mouth 2 (two) times daily. Patient not taking: No sig reported 04/18/21   Jules Husbands, MD    Allergies Gabapentin and Bactrim [sulfamethoxazole-trimethoprim]  Family History  Problem Relation Age of Onset   Hypertension Mother  Heart disease Mother    Heart attack Mother    Breast cancer Mother    Hypertension Father    Breast cancer Paternal Aunt     Social History Social History   Tobacco Use   Smoking status: Some Days    Packs/day: 0.10    Types: Cigarettes   Smokeless tobacco: Never  Vaping Use   Vaping Use: Never used  Substance Use Topics   Alcohol use: No   Drug use: No    Review of Systems Constitutional: No fever/chills Eyes: No visual changes. ENT: No sore throat. Cardiovascular: Denies chest pain. Respiratory: Denies shortness of breath. Gastrointestinal: No abdominal pain.  No nausea, no vomiting.  No diarrhea.   Genitourinary: Negative for dysuria. Musculoskeletal: Right leg pain. Skin: Negative for rash. Neurological: Negative for headaches, focal weakness or numbness.  ____________________________________________   PHYSICAL EXAM:  VITAL SIGNS: ED Triage Vitals [06/26/21 2241]  Enc Vitals Group     BP (!) 165/95     Pulse Rate 80     Resp 18     Temp 98.7 F (37.1 C)     Temp Source Oral     SpO2 99 %     Weight 240 lb (108.9 kg)     Height '5\' 2"'$  (1.575  m)    Constitutional: Alert and oriented.  Eyes: Conjunctivae are normal.  ENT      Head: Normocephalic and atraumatic.      Nose: No congestion/rhinnorhea.      Mouth/Throat: Mucous membranes are moist.      Neck: No stridor. Hematological/Lymphatic/Immunilogical: No cervical lymphadenopathy. Cardiovascular: Normal rate, regular rhythm.  No murmurs, rubs, or gallops.  Respiratory: Normal respiratory effort without tachypnea nor retractions. Breath sounds are clear and equal bilaterally. No wheezes/rales/rhonchi. Gastrointestinal: Soft and distended and consistent with hernia. Genitourinary: Deferred Musculoskeletal: Right leg without discoloration. Perhaps slightly cooler than left leg. DP faintly palpable.  Neurologic:  Normal speech and language. No gross focal neurologic deficits are appreciated.  Skin:  Skin is warm, dry and intact. No rash noted. Psychiatric: Mood and affect are normal. Speech and behavior are normal. Patient exhibits appropriate insight and judgment.  ____________________________________________    LABS (pertinent positives/negatives)  BMP wnl except glu 114, ca 8.4 CBC wbc 11.0, hgb 13.1, plt 200  ____________________________________________   EKG  None  ____________________________________________    RADIOLOGY  CT angio pending  ____________________________________________   PROCEDURES  Procedures  ____________________________________________   INITIAL IMPRESSION / ASSESSMENT AND PLAN / ED COURSE  Pertinent labs & imaging results that were available during my care of the patient were reviewed by me and considered in my medical decision making (see chart for details).   Patient presents to the emergency department today because of concerns for right leg pain.  Patient has history of multiple arterial thrombi and stents placed in the past.  She did come off her Eliquis recently secondary to scheduled surgery in 2 days.  At this time no  obvious discoloration of the leg.  Is perhaps slightly cooler than the right leg.  Will check CT to evaluate for acute occlusion or thrombi.  ___________________________________________   FINAL CLINICAL IMPRESSION(S) / ED DIAGNOSES  Right leg pain    Note: This dictation was prepared with Dragon dictation. Any transcriptional errors that result from this process are unintentional     Nance Pear, MD 06/27/21 [phone]

## 2021-06-27 ENCOUNTER — Encounter: Payer: Self-pay | Admitting: Family Medicine

## 2021-06-27 ENCOUNTER — Emergency Department: Payer: Medicare Other

## 2021-06-27 ENCOUNTER — Other Ambulatory Visit: Payer: Self-pay

## 2021-06-27 DIAGNOSIS — Z7901 Long term (current) use of anticoagulants: Secondary | ICD-10-CM | POA: Diagnosis not present

## 2021-06-27 DIAGNOSIS — I480 Paroxysmal atrial fibrillation: Secondary | ICD-10-CM

## 2021-06-27 DIAGNOSIS — I70223 Atherosclerosis of native arteries of extremities with rest pain, bilateral legs: Secondary | ICD-10-CM | POA: Diagnosis not present

## 2021-06-27 DIAGNOSIS — I700 Atherosclerosis of aorta: Secondary | ICD-10-CM | POA: Diagnosis present

## 2021-06-27 DIAGNOSIS — I739 Peripheral vascular disease, unspecified: Secondary | ICD-10-CM

## 2021-06-27 DIAGNOSIS — F17210 Nicotine dependence, cigarettes, uncomplicated: Secondary | ICD-10-CM | POA: Diagnosis present

## 2021-06-27 DIAGNOSIS — Z6841 Body Mass Index (BMI) 40.0 and over, adult: Secondary | ICD-10-CM | POA: Diagnosis not present

## 2021-06-27 DIAGNOSIS — I7090 Unspecified atherosclerosis: Secondary | ICD-10-CM | POA: Diagnosis not present

## 2021-06-27 DIAGNOSIS — K430 Incisional hernia with obstruction, without gangrene: Secondary | ICD-10-CM | POA: Diagnosis not present

## 2021-06-27 DIAGNOSIS — I248 Other forms of acute ischemic heart disease: Secondary | ICD-10-CM | POA: Diagnosis present

## 2021-06-27 DIAGNOSIS — F32A Depression, unspecified: Secondary | ICD-10-CM | POA: Diagnosis present

## 2021-06-27 DIAGNOSIS — K436 Other and unspecified ventral hernia with obstruction, without gangrene: Secondary | ICD-10-CM

## 2021-06-27 DIAGNOSIS — Z8616 Personal history of COVID-19: Secondary | ICD-10-CM | POA: Diagnosis not present

## 2021-06-27 DIAGNOSIS — E1151 Type 2 diabetes mellitus with diabetic peripheral angiopathy without gangrene: Secondary | ICD-10-CM | POA: Diagnosis present

## 2021-06-27 DIAGNOSIS — M79671 Pain in right foot: Secondary | ICD-10-CM

## 2021-06-27 DIAGNOSIS — I214 Non-ST elevation (NSTEMI) myocardial infarction: Secondary | ICD-10-CM

## 2021-06-27 DIAGNOSIS — T82868S Thrombosis of vascular prosthetic devices, implants and grafts, sequela: Secondary | ICD-10-CM

## 2021-06-27 DIAGNOSIS — K439 Ventral hernia without obstruction or gangrene: Secondary | ICD-10-CM | POA: Diagnosis present

## 2021-06-27 DIAGNOSIS — K55019 Acute (reversible) ischemia of small intestine, extent unspecified: Secondary | ICD-10-CM | POA: Diagnosis present

## 2021-06-27 DIAGNOSIS — Z89422 Acquired absence of other left toe(s): Secondary | ICD-10-CM | POA: Diagnosis not present

## 2021-06-27 DIAGNOSIS — K55069 Acute infarction of intestine, part and extent unspecified: Secondary | ICD-10-CM

## 2021-06-27 DIAGNOSIS — Z95820 Peripheral vascular angioplasty status with implants and grafts: Secondary | ICD-10-CM | POA: Diagnosis not present

## 2021-06-27 DIAGNOSIS — Z803 Family history of malignant neoplasm of breast: Secondary | ICD-10-CM | POA: Diagnosis not present

## 2021-06-27 DIAGNOSIS — M170 Bilateral primary osteoarthritis of knee: Secondary | ICD-10-CM | POA: Diagnosis present

## 2021-06-27 DIAGNOSIS — Z8249 Family history of ischemic heart disease and other diseases of the circulatory system: Secondary | ICD-10-CM | POA: Diagnosis not present

## 2021-06-27 DIAGNOSIS — I998 Other disorder of circulatory system: Secondary | ICD-10-CM

## 2021-06-27 DIAGNOSIS — Z20822 Contact with and (suspected) exposure to covid-19: Secondary | ICD-10-CM | POA: Diagnosis present

## 2021-06-27 DIAGNOSIS — I743 Embolism and thrombosis of arteries of the lower extremities: Secondary | ICD-10-CM | POA: Diagnosis present

## 2021-06-27 DIAGNOSIS — E1142 Type 2 diabetes mellitus with diabetic polyneuropathy: Secondary | ICD-10-CM | POA: Diagnosis present

## 2021-06-27 DIAGNOSIS — J449 Chronic obstructive pulmonary disease, unspecified: Secondary | ICD-10-CM

## 2021-06-27 DIAGNOSIS — K56609 Unspecified intestinal obstruction, unspecified as to partial versus complete obstruction: Secondary | ICD-10-CM | POA: Diagnosis present

## 2021-06-27 DIAGNOSIS — Z9049 Acquired absence of other specified parts of digestive tract: Secondary | ICD-10-CM | POA: Diagnosis not present

## 2021-06-27 DIAGNOSIS — T82868A Thrombosis of vascular prosthetic devices, implants and grafts, initial encounter: Secondary | ICD-10-CM | POA: Diagnosis present

## 2021-06-27 DIAGNOSIS — Z85828 Personal history of other malignant neoplasm of skin: Secondary | ICD-10-CM | POA: Diagnosis not present

## 2021-06-27 DIAGNOSIS — I70221 Atherosclerosis of native arteries of extremities with rest pain, right leg: Secondary | ICD-10-CM | POA: Diagnosis present

## 2021-06-27 HISTORY — DX: Non-ST elevation (NSTEMI) myocardial infarction: I21.4

## 2021-06-27 LAB — TROPONIN I (HIGH SENSITIVITY)
Troponin I (High Sensitivity): 893 ng/L (ref <18)
Troponin I (High Sensitivity): 1587 ng/L (ref <18)
Troponin I (High Sensitivity): 1748 ng/L (ref <18)

## 2021-06-27 LAB — APTT
aPTT: 25 seconds (ref 24–36)
aPTT: 65 seconds — ABNORMAL HIGH (ref 24–36)
aPTT: 81 seconds — ABNORMAL HIGH (ref 24–36)
aPTT: 72 seconds — ABNORMAL HIGH (ref 24–36)

## 2021-06-27 LAB — PROTIME-INR
INR: 0.9 (ref 0.8–1.2)
Prothrombin Time: 12.3 seconds (ref 11.4–15.2)

## 2021-06-27 LAB — BASIC METABOLIC PANEL
Anion gap: 5 (ref 5–15)
BUN: 21 mg/dL (ref 8–23)
CO2: 25 mmol/L (ref 22–32)
Calcium: 8.2 mg/dL — ABNORMAL LOW (ref 8.9–10.3)
Chloride: 108 mmol/L (ref 98–111)
Creatinine, Ser: 0.81 mg/dL (ref 0.44–1.00)
GFR, Estimated: >60 mL/min (ref >60)
Glucose, Bld: 99 mg/dL (ref 70–99)
Potassium: 4.1 mmol/L (ref 3.5–5.1)
Sodium: 138 mmol/L (ref 135–145)

## 2021-06-27 LAB — CBC
HCT: 38.3 % (ref 36.0–46.0)
Hemoglobin: 12.5 g/dL (ref 12.0–15.0)
MCH: 29.6 pg (ref 26.0–34.0)
MCHC: 32.6 g/dL (ref 30.0–36.0)
MCV: 90.8 fL (ref 80.0–100.0)
Platelets: 207 10*3/uL (ref 150–400)
RBC: 4.22 MIL/uL (ref 3.87–5.11)
RDW: 16.3 % — ABNORMAL HIGH (ref 11.5–15.5)
WBC: 9.5 10*3/uL (ref 4.0–10.5)
nRBC: 0 % (ref 0.0–0.2)

## 2021-06-27 LAB — RESP PANEL BY RT-PCR (FLU A&B, COVID) ARPGX2
Influenza A by PCR: NEGATIVE
Influenza B by PCR: NEGATIVE
SARS Coronavirus 2 by RT PCR: NEGATIVE

## 2021-06-27 LAB — HEPARIN LEVEL (UNFRACTIONATED)
Heparin Unfractionated: <0.1 IU/mL — ABNORMAL LOW (ref 0.30–0.70)
Heparin Unfractionated: 0.72 IU/mL — ABNORMAL HIGH (ref 0.30–0.70)

## 2021-06-27 MED ORDER — ASPIRIN EC 81 MG PO TBEC
81.0000 mg | DELAYED_RELEASE_TABLET | Freq: Every day | ORAL | Status: DC
  Administered 2021-06-27 – 2021-07-01 (×3): 81 mg via ORAL
  Filled 2021-06-27 (×4): qty 1

## 2021-06-27 MED ORDER — NITROGLYCERIN 0.4 MG SL SUBL: 0.4000 mg | SUBLINGUAL_TABLET | SUBLINGUAL | Status: DC | PRN

## 2021-06-27 MED ORDER — ATORVASTATIN CALCIUM 20 MG PO TABS
80.0000 mg | ORAL_TABLET | Freq: Every day | ORAL | Status: DC
  Administered 2021-06-27 – 2021-06-30 (×4): 80 mg via ORAL
  Filled 2021-06-27 (×4): qty 4

## 2021-06-27 MED ORDER — HYDROMORPHONE HCL 1 MG/ML IJ SOLN: 0.5000 mg | INTRAMUSCULAR | Status: DC | PRN

## 2021-06-27 MED ORDER — HYDROMORPHONE HCL 1 MG/ML IJ SOLN
0.5000 mg | INTRAMUSCULAR | Status: DC | PRN
  Administered 2021-06-27 (×3): 1 mg via INTRAVENOUS
  Filled 2021-06-27 (×3): qty 1

## 2021-06-27 MED ORDER — HEPARIN BOLUS VIA INFUSION
1100.0000 [IU] | Freq: Once | INTRAVENOUS | Status: AC
  Administered 2021-06-27: 1100 [IU] via INTRAVENOUS
  Filled 2021-06-27: qty 1100

## 2021-06-27 MED ORDER — OXYCODONE-ACETAMINOPHEN 5-325 MG PO TABS
1.0000 | ORAL_TABLET | ORAL | Status: DC | PRN
  Administered 2021-06-27: 1 via ORAL
  Administered 2021-06-27 – 2021-06-28 (×2): 2 via ORAL
  Filled 2021-06-27 (×4): qty 2
  Filled 2021-06-27: qty 1

## 2021-06-27 MED ORDER — PANTOPRAZOLE SODIUM 40 MG PO TBEC
40.0000 mg | DELAYED_RELEASE_TABLET | Freq: Every day | ORAL | Status: DC
  Administered 2021-06-27 – 2021-07-01 (×3): 40 mg via ORAL
  Filled 2021-06-27 (×4): qty 1

## 2021-06-27 MED ORDER — IPRATROPIUM-ALBUTEROL 20-100 MCG/ACT IN AERS
1.0000 | INHALATION_SPRAY | Freq: Four times a day (QID) | RESPIRATORY_TRACT | Status: DC | PRN
  Filled 2021-06-27: qty 4

## 2021-06-27 MED ORDER — ONDANSETRON HCL 4 MG/2ML IJ SOLN: 4.0000 mg | Freq: Four times a day (QID) | INTRAMUSCULAR | Status: DC | PRN

## 2021-06-27 MED ORDER — HEPARIN (PORCINE) 25000 UT/250ML-% IV SOLN
1350.0000 [IU]/h | INTRAVENOUS | Status: DC
  Administered 2021-06-27: 1450 [IU]/h via INTRAVENOUS
  Administered 2021-06-27: 1300 [IU]/h via INTRAVENOUS
  Administered 2021-06-28: 1350 [IU]/h via INTRAVENOUS
  Filled 2021-06-27 (×3): qty 250

## 2021-06-27 MED ORDER — HYDROMORPHONE HCL 1 MG/ML IJ SOLN
0.5000 mg | INTRAMUSCULAR | Status: DC | PRN
  Administered 2021-06-27 – 2021-06-28 (×4): 1 mg via INTRAVENOUS
  Filled 2021-06-27 (×4): qty 1

## 2021-06-27 MED ORDER — ACETAMINOPHEN 650 MG RE SUPP: 650.0000 mg | Freq: Four times a day (QID) | RECTAL | Status: DC | PRN

## 2021-06-27 MED ORDER — IOHEXOL 350 MG/ML SOLN
100.0000 mL | Freq: Once | INTRAVENOUS | Status: AC | PRN
  Administered 2021-06-27: 100 mL via INTRAVENOUS

## 2021-06-27 MED ORDER — METOPROLOL TARTRATE 50 MG PO TABS
50.0000 mg | ORAL_TABLET | Freq: Two times a day (BID) | ORAL | Status: DC
  Administered 2021-06-27 – 2021-07-01 (×7): 50 mg via ORAL
  Filled 2021-06-27 (×8): qty 1

## 2021-06-27 MED ORDER — HYDROMORPHONE HCL 1 MG/ML IJ SOLN
0.5000 mg | Freq: Once | INTRAMUSCULAR | Status: AC
  Administered 2021-06-27: 0.5 mg via INTRAVENOUS
  Filled 2021-06-27: qty 1

## 2021-06-27 MED ORDER — ALUM & MAG HYDROXIDE-SIMETH 200-200-20 MG/5ML PO SUSP: 15.0000 mL | ORAL | Status: DC | PRN

## 2021-06-27 MED ORDER — FENTANYL CITRATE PF 50 MCG/ML IJ SOSY
50.0000 ug | PREFILLED_SYRINGE | Freq: Once | INTRAMUSCULAR | Status: AC
Start: 2021-06-27 — End: 2021-06-27
  Administered 2021-06-27: 50 ug via INTRAVENOUS
  Filled 2021-06-27: qty 1

## 2021-06-27 MED ORDER — ACETAMINOPHEN 325 MG PO TABS
650.0000 mg | ORAL_TABLET | Freq: Four times a day (QID) | ORAL | Status: DC | PRN
  Administered 2021-07-01: 650 mg via ORAL
  Filled 2021-06-27: qty 2

## 2021-06-27 MED ORDER — HEPARIN BOLUS VIA INFUSION
4000.0000 [IU] | Freq: Once | INTRAVENOUS | Status: AC
  Administered 2021-06-27: 4000 [IU] via INTRAVENOUS
  Filled 2021-06-27: qty 4000

## 2021-06-27 NOTE — Progress Notes (Signed)
ANTICOAGULATION CONSULT NOTE - Initial Consult  Pharmacy Consult for Heparin  Indication: DVT  Allergies  Allergen Reactions   Gabapentin     Tremors    Bactrim [Sulfamethoxazole-Trimethoprim] Itching    Patient Measurements: Height: '5\' 2"'$  (157.5 cm) Weight: 108.9 kg (240 lb) IBW/kg (Calculated) : 50.1 Heparin Dosing Weight:  76.5 kg   Vital Signs: Temp: 98.7 F (37.1 C) (09/04 2241) Temp Source: Oral (09/04 2241) BP: 134/69 (09/05 0122) Pulse Rate: 60 (09/05 0122)  Labs: Recent Labs    06/26/21 2248  HGB 13.1  HCT 39.7  PLT 200  CREATININE 0.95    Estimated Creatinine Clearance: 72.3 mL/min (by C-G formula based on SCr of 0.95 mg/dL).   Medical History: Past Medical History:  Diagnosis Date   Abdominal aortic atherosclerosis ([place])    a. 05/2017 CTA abd/pelvis: significant atherosclerotic dzs of the infrarenal abd Ao w/ some mural thrombus. No aneurysm or dissection.   Acute focal ischemia of small intestine (HCC)    Acute right-sided low back pain with right-sided sciatica 06/13/2017   AKI (acute kidney injury) ([place]) 07/29/2017   Anginal pain ([place])    a. 08/2012 Lexiscan MV: EF 54%, non ischemia/infarct.   Anxiety and depression    Baker's cyst of knee, right    a. 07/2016 U/S: 4.1 x 1.4 x 2.9 cystic structure in R poplitetal fossa.   Bell's palsy    Borderline diabetes    CHF (congestive heart failure) (HCC)    Chronic anticoagulation    apixaban   Chronic idiopathic constipation    Chronic mesenteric ischemia (HCC)    Chronic pain    COPD (chronic obstructive pulmonary disease) (HCC)    Diastolic dysfunction    a. 05/2017 Echo: Ef 60-65%, no rwma, Gr1 DD, no source of cardiac emboli.   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 05/2017 CTA Abd/pelvis: apparent thrombus or embolus in prox SMA (70-90%); b. 05/2017 catheter directed tPA, mechanical thrombectomy, and stenting of the SMA.   Essential hypertension with goal blood pressure  less than 140/90 01/19/2016   Gastroesophageal reflux disease 01/19/2016   GERD (gastroesophageal reflux disease)    H/O colectomy    History of 2019 novel coronavirus disease (COVID-19) 11/14/2019   History of kidney stones    Hyperlipidemia    Hypertension    Morbid obesity with BMI of 40.0-44.9, adult ([place])    Occlusive mesenteric ischemia ([place]) 06/19/2017   Osteoarthritis    PAD (peripheral artery disease) (HCC)    PAF (paroxysmal atrial fibrillation) (HCC)    Palpitations    Peripheral edema    Personal history of other malignant neoplasm of skin 06/01/2016   Pneumonia    Pre-diabetes    Primary osteoarthritis of both knees 06/13/2017   SBO (small bowel obstruction) ([place]) 08/06/2017   Skin cancer of nose    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HCC)    Vitamin [name] deficiency     Medications:  (Not in a hospital admission)   Assessment: Pharmacy consulted to dose heparin in this 60 year old female with arterial blood clots in leg and abdomen.  Pt was on Eliquis 5 mg PO BID.  Per RN, last dose was on 9/1. CrCl = 72.3 ml/min  Goal of Therapy:  aPTT:  66 - 102 Heparin level 0.3-0.7 units/ml Monitor platelets by anticoagulation protocol: Yes   Plan:  Give 4000 units bolus x 1 Start heparin infusion at 1300 units/hr Check anti-Xa level in  6 hours and daily while on heparin Continue to monitor H&H and platelets Will check aPTT and HL 6 hrs after start of drip.   Carla [name] 06/27/2021,2:34 AM

## 2021-06-27 NOTE — ED Notes (Signed)
Pt lying in bed awaiting results of CT scan. Pt reports pain starting to worsen in right leg once again. Pt calm and cooperative, with stable vitals.

## 2021-06-27 NOTE — H&P (Signed)
History and Physical    Carla [name] [id number] DOB: 29-Dec-1960 DOA: 06/26/2021  PCP: Kirk Ruths, MD   Patient coming from: Home   Chief Complaint: Right leg pain   HPI: Carla [name] is a 60 y.o. female with medical history significant for peripheral arterial disease status post multiple stents and thrombectomies, SMA thrombosis status post remote intervention, CAD, PAF on Eliquis, COPD, and recurrent ventral hernia scheduled for repair on 06/28/2021, now presenting to the emergency department for evaluation of severe right leg pain.  The patient reports that she has been holding Eliquis for the upcoming surgery and then developed pain in the right leg yesterday morning that has become increasingly severe.  She denies any lower extremity wound and denies any fevers or chills.  She has chronic abdominal pain that is unchanged.  Denies chest pain.  ED Course: Upon arrival to the ED, patient is found to be afebrile, saturating well on room air, and with stable blood pressure.  Chemistry panel is unremarkable and CBC with mild leukocytosis.  CTA chest notable for thrombosis of stented proximal SMA with reconstitution, thrombosis of bilateral iliac artery stents with resultant thrombosis of abdominal aorta and bilateral lower extremity runoff disease with occlusion of all 3 RLE runoffs.  Vascular surgery was consulted by the ED physician the patient was started on IV heparin and treated with fentanyl.  Review of Systems:  All other systems reviewed and apart from HPI, are negative.  Past Medical History:  Diagnosis Date   Abdominal aortic atherosclerosis ([place])    a. 05/2017 CTA abd/pelvis: significant atherosclerotic dzs of the infrarenal abd Ao w/ some mural thrombus. No aneurysm or dissection.   Acute focal ischemia of small intestine (HCC)    Acute right-sided low back pain with right-sided sciatica 06/13/2017   AKI (acute kidney injury) ([place]) 07/29/2017   Anginal pain ([place])    a.  08/2012 Lexiscan MV: EF 54%, non ischemia/infarct.   Anxiety and depression    Baker's cyst of knee, right    a. 07/2016 U/S: 4.1 x 1.4 x 2.9 cystic structure in R poplitetal fossa.   Bell's palsy    Borderline diabetes    CHF (congestive heart failure) (HCC)    Chronic anticoagulation    apixaban   Chronic idiopathic constipation    Chronic mesenteric ischemia (HCC)    Chronic pain    COPD (chronic obstructive pulmonary disease) (HCC)    Diastolic dysfunction    a. 05/2017 Echo: Ef 60-65%, no rwma, Gr1 DD, no source of cardiac emboli.   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 05/2017 CTA Abd/pelvis: apparent thrombus or embolus in prox SMA (70-90%); b. 05/2017 catheter directed tPA, mechanical thrombectomy, and stenting of the SMA.   Essential hypertension with goal blood pressure less than 140/90 01/19/2016   Gastroesophageal reflux disease 01/19/2016   GERD (gastroesophageal reflux disease)    H/O colectomy    History of 2019 novel coronavirus disease (COVID-19) 11/14/2019   History of kidney stones    Hyperlipidemia    Hypertension    Morbid obesity with BMI of 40.0-44.9, adult ([place])    Occlusive mesenteric ischemia ([place]) 06/19/2017   Osteoarthritis    PAD (peripheral artery disease) (HCC)    PAF (paroxysmal atrial fibrillation) (HCC)    Palpitations    Peripheral edema    Personal history of other malignant neoplasm of skin 06/01/2016   Pneumonia    Pre-diabetes  Primary osteoarthritis of both knees 06/13/2017   SBO (small bowel obstruction) ([place]) 08/06/2017   Skin cancer of nose    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HCC)    Vitamin D deficiency     Past Surgical History:  Procedure Laterality Date   AMPUTATION TOE Left 12/09/2019   Procedure: AMPUTATION TOE MPJ left;  Surgeon: Caroline More, DPM;  Location: [facility];  Service: Podiatry;  Laterality: Left;   APPENDECTOMY     CHOLECYSTECTOMY     COLON SURGERY     EMBOLECTOMY OR  THROMBECTOMY, WITH OR WITHOUT CATHETER; FEMOROPOPLITEAL, AORTOILIAC ARTERY, BY LEG INCISION N/A 06/21/2020   Left - Decomp Fasciotomy Leg; Ant &/Or Lat Compart Only; Midline - Angiography, Visceral, Selective Or Supraselective (With Or Without Flush Aortogram); Left - Revascularize, Endovasc, Open/Percut, Iliac Artery, Unilat, Initial Vessel; W/Translum Stent, W/Angioplasty; Right - Revascularize, Endovasc, Open/Percut, Iliac Artery, Ea Add`L Ipsilateral; W/Translumin Stent, W/Angioplasty; Location [facility];   LAPAROTOMY N/A 08/08/2017   Procedure: EXPLORATORY LAPAROTOMY POSSIBLE BOWEL RESECTION;  Surgeon: Jules Husbands, MD;  Location: [facility];  Service: General;  Laterality: N/A;   LEFT HEART CATH AND CORONARY ANGIOGRAPHY N/A 05/17/2021   Procedure: LEFT HEART CATH AND CORONARY ANGIOGRAPHY;  Surgeon: Yolonda Kida, MD;  Location: [place] [facility];  Service: Cardiovascular;  Laterality: N/A;   LOWER EXTREMITY ANGIOGRAPHY Left 11/17/2019   Procedure: LOWER EXTREMITY ANGIOGRAPHY;  Surgeon: Algernon Huxley, MD;  Location: [place] [facility];  Service: Cardiovascular;  Laterality: Left;   LOWER EXTREMITY ANGIOGRAPHY Right 12/13/2020   Procedure: LOWER EXTREMITY ANGIOGRAPHY;  Surgeon: Algernon Huxley, MD;  Location: [place] [facility];  Service: Cardiovascular;  Laterality: Right;   LOWER EXTREMITY ANGIOGRAPHY Right 01/10/2021   Procedure: LOWER EXTREMITY ANGIOGRAPHY;  Surgeon: Algernon Huxley, MD;  Location: [place] [facility];  Service: Cardiovascular;  Laterality: Right;   LOWER EXTREMITY ANGIOGRAPHY Right 01/24/2021   Procedure: LOWER EXTREMITY ANGIOGRAPHY;  Surgeon: Algernon Huxley, MD;  Location: [place] [facility];  Service: Cardiovascular;  Laterality: Right;   LOWER EXTREMITY INTERVENTION N/A 11/19/2019   Procedure: LOWER EXTREMITY INTERVENTION;  Surgeon: Algernon Huxley, MD;  Location: [place] [facility];  Service: Cardiovascular;  Laterality: N/A;   TEE WITHOUT CARDIOVERSION N/A 06/22/2017    Procedure: TRANSESOPHAGEAL ECHOCARDIOGRAM (TEE);  Surgeon: Wellington Hampshire, MD;  Location: [facility];  Service: Cardiovascular;  Laterality: N/A;   TOTAL KNEE ARTHROPLASTY Right 07/11/2018   Procedure: TOTAL KNEE ARTHROPLASTY;  Surgeon: Corky Mull, MD;  Location: [facility];  Service: Orthopedics;  Laterality: Right;   VAGINAL HYSTERECTOMY     VISCERAL ARTERY INTERVENTION N/A 06/20/2017   Procedure: Visceral Artery Intervention, possible aortic thrombectomy;  Surgeon: Algernon Huxley, MD;  Location: [place] [facility];  Service: Cardiovascular;  Laterality: N/A;   VISCERAL ARTERY INTERVENTION N/A 01/28/2018   Procedure: VISCERAL ARTERY INTERVENTION;  Surgeon: Algernon Huxley, MD;  Location: [place] [facility];  Service: Cardiovascular;  Laterality: N/A;    Social History:   reports that she has been smoking cigarettes. She has been smoking an average of .1 packs per day. She has never used smokeless tobacco. She reports that she does not drink alcohol and does not use drugs.  Allergies  Allergen Reactions   Gabapentin     Tremors    Bactrim [Sulfamethoxazole-Trimethoprim] Itching    Family History  Problem Relation Age of Onset   Hypertension Mother    Heart disease Mother    Heart attack Mother  Breast cancer Mother    Hypertension Father    Breast cancer Paternal Aunt      Prior to Admission medications   Medication Sig Start Date End Date Taking? Authorizing Provider  albuterol (VENTOLIN HFA) 108 (90 Base) MCG/ACT inhaler Inhale 2 puffs into the lungs every 6 (six) hours as needed for wheezing or shortness of breath. 09/03/19   [provider]  Alpha-Lipoic Acid 600 MG CAPS Take 1 capsule (600 mg total) by mouth daily. 06/16/21 07/16/21  Gillis Santa, MD  ALPRAZolam Duanne Moron) 0.5 MG tablet Take 1 tablet (0.5 mg total) by mouth 2 (two) times daily as needed for anxiety. 06/22/21   Pabon, Diego F, MD  amLODipine (NORVASC) 5 MG tablet Take 1 tablet (5 mg total) by mouth  daily. 04/10/18   Gladstone Lighter, MD  apixaban (ELIQUIS) 5 MG TABS tablet Take 1 tablet (5 mg total) by mouth 2 (two) times daily. 01/25/21   Stegmayer, Janalyn Harder, PA-C  aspirin EC 81 MG tablet Take 81 mg by mouth daily. Swallow whole.    [provider]  atorvastatin (LIPITOR) 80 MG tablet Take 80 mg by mouth at bedtime.    [provider]  COMBIVENT RESPIMAT 20-100 MCG/ACT AERS respimat Inhale 1 puff into the lungs daily as needed for wheezing or shortness of breath. 04/06/21   [provider]  ferrous sulfate 325 (65 FE) MG tablet Take 325 mg by mouth daily.    [provider]  metoprolol tartrate (LOPRESSOR) 50 MG tablet Take 1 tablet (50 mg total) by mouth 2 (two) times daily. 06/15/18   Bettey Costa, MD  omeprazole (PRILOSEC) 20 MG capsule Take 20 mg by mouth 2 (two) times daily before a meal.    [provider]  pregabalin (LYRICA) 200 MG capsule Take 1 capsule (200 mg total) by mouth 2 (two) times daily. Patient taking differently: Take 100 mg by mouth 3 (three) times daily. 02/16/21   Pabon, Diego F, MD  varenicline (CHANTIX) 0.5 MG tablet Take 1 tablet (0.5 mg total) by mouth 2 (two) times daily. Patient not taking: No sig reported 04/18/21   Jules Husbands, MD    Physical Exam: Vitals:   06/27/21 0249 06/27/21 0300 06/27/21 0330 06/27/21 0400  BP: 114/76 119/74 131/67 137/68  Pulse: 60 62 (!) 56 63  Resp: '18 18 18 18  '$ Temp:      TempSrc:      SpO2: 98% 97% 97% 98%  Weight:      Height:        Constitutional: NAD, calm  Eyes: PERTLA, lids and conjunctivae normal ENMT: Mucous membranes are moist. Posterior pharynx clear of any exudate or lesions.   Neck:  supple, no masses  Respiratory:  no wheezing, no crackles. No accessory muscle use.  Cardiovascular: S1 & S2 heard, regular rate and rhythm. No significant JVD. Abdomen: No distension, soft, large soft ventral hernia, no rebound pain or guarding. Bowel sounds active.   Musculoskeletal: no clubbing / cyanosis. No joint deformity upper and lower extremities.   Skin: no significant rashes, lesions or ulcers. Right foot cool with mottled toes. Neurologic: CN 2-12 grossly intact. Moving all extremities.  Psychiatric: Alert and oriented to person, place, and situation. Pleasant and cooperative.    Labs and Imaging on Admission: I have personally reviewed following labs and imaging studies  CBC: Recent Labs  Lab 06/26/21 2248  WBC 11.0*  NEUTROABS 7.7  HGB 13.1  HCT 39.7  MCV 91.9  PLT  [id number]   Basic Metabolic Panel: Recent Labs  Lab 06/26/21 2248  NA 140  K 3.8  CL 107  CO2 24  GLUCOSE 114*  BUN 21  CREATININE 0.95  CALCIUM 8.4*   GFR: Estimated Creatinine Clearance: 72.3 mL/min (by C-G formula based on SCr of 0.95 mg/dL). Liver Function Tests: No results for input(s): AST, ALT, ALKPHOS, BILITOT, PROT, ALBUMIN in the last 168 hours. No results for input(s): LIPASE, AMYLASE in the last 168 hours. No results for input(s): AMMONIA in the last 168 hours. Coagulation Profile: Recent Labs  Lab 06/27/21 0236  INR 0.9   Cardiac Enzymes: No results for input(s): CKTOTAL, CKMB, CKMBINDEX, TROPONINI in the last 168 hours. BNP (last 3 results) No results for input(s): PROBNP in the last 8760 hours. HbA1C: No results for input(s): HGBA1C in the last 72 hours. CBG: No results for input(s): GLUCAP in the last 168 hours. Lipid Profile: No results for input(s): CHOL, HDL, LDLCALC, TRIG, CHOLHDL, LDLDIRECT in the last 72 hours. Thyroid Function Tests: No results for input(s): TSH, T4TOTAL, FREET4, T3FREE, THYROIDAB in the last 72 hours. Anemia Panel: No results for input(s): VITAMINB12, FOLATE, FERRITIN, TIBC, IRON, RETICCTPCT in the last 72 hours. Urine analysis:    Component Value Date/Time   COLORURINE AMBER (A) 07/11/2019 1451   APPEARANCEUR CLOUDY (A) 07/11/2019 1451   APPEARANCEUR Clear 01/19/2015 1112   LABSPEC 1.027 07/11/2019 1451    LABSPEC 1.019 01/19/2015 1112   PHURINE 5.0 07/11/2019 1451   GLUCOSEU NEGATIVE 07/11/2019 1451   GLUCOSEU Negative 01/19/2015 1112   HGBUR MODERATE (A) 07/11/2019 1451   BILIRUBINUR NEGATIVE 07/11/2019 1451   BILIRUBINUR Negative 01/19/2015 1112   KETONESUR NEGATIVE 07/11/2019 1451   PROTEINUR NEGATIVE 07/11/2019 1451   NITRITE POSITIVE (A) 07/11/2019 1451   LEUKOCYTESUR NEGATIVE 07/11/2019 1451   LEUKOCYTESUR Negative 01/19/2015 1112   Sepsis Labs: '@LABRCNTIP'$ (procalcitonin:4,lacticidven:4) ) Recent Results (from the past 240 hour(s))  SARS CORONAVIRUS 2 (TAT 6-24 HRS) Nasopharyngeal Nasopharyngeal Swab     Status: None   Collection Time: 06/24/21  9:15 AM   Specimen: Nasopharyngeal Swab  Result Value Ref Range Status   SARS Coronavirus 2 NEGATIVE NEGATIVE Final    Comment: (NOTE) SARS-CoV-2 target nucleic acids are NOT DETECTED.  The SARS-CoV-2 RNA is generally detectable in upper and lower respiratory specimens during the acute phase of infection. Negative results do not preclude SARS-CoV-2 infection, do not rule out co-infections with other pathogens, and should not be used as the sole basis for treatment or other patient management decisions. Negative results must be combined with clinical observations, patient history, and epidemiological information. The expected result is Negative.  Fact Sheet for Patients: SugarRoll.be  Fact Sheet for Healthcare Providers: https://www.woods-mathews.com/  This test is not yet approved or cleared by the [place] FDA and  has been authorized for detection and/or diagnosis of SARS-CoV-2 by FDA under an Emergency Use Authorization (EUA). This EUA will remain  in effect (meaning this test can be used) for the duration of the COVID-19 declaration under Se ction 564(b)(1) of the Act, 21 U.S.C. section 360bbb-3(b)(1), unless the authorization is terminated or revoked sooner.  Performed at  [facility], [place] [address] [place], [place] [address]   Resp Panel by RT-PCR (Flu A&B, Covid) Nasopharyngeal Swab     Status: None   Collection Time: 06/27/21  3:35 AM   Specimen: Nasopharyngeal Swab; Nasopharyngeal(NP) swabs in vial transport medium  Result Value Ref Range Status   SARS Coronavirus 2 by  RT PCR NEGATIVE NEGATIVE Final    Comment: (NOTE) SARS-CoV-2 target nucleic acids are NOT DETECTED.  The SARS-CoV-2 RNA is generally detectable in upper respiratory specimens during the acute phase of infection. The lowest concentration of SARS-CoV-2 viral copies this assay can detect is 138 copies/mL. A negative result does not preclude SARS-Cov-2 infection and should not be used as the sole basis for treatment or other patient management decisions. A negative result may occur with  improper specimen collection/handling, submission of specimen other than nasopharyngeal swab, presence of viral mutation(s) within the areas targeted by this assay, and inadequate number of viral copies(<138 copies/mL). A negative result must be combined with clinical observations, patient history, and epidemiological information. The expected result is Negative.  Fact Sheet for Patients:  EntrepreneurPulse.com.au  Fact Sheet for Healthcare Providers:  IncredibleEmployment.be  This test is no t yet approved or cleared by the [place] FDA and  has been authorized for detection and/or diagnosis of SARS-CoV-2 by FDA under an Emergency Use Authorization (EUA). This EUA will remain  in effect (meaning this test can be used) for the duration of the COVID-19 declaration under Section 564(b)(1) of the Act, 21 U.S.C.section 360bbb-3(b)(1), unless the authorization is terminated  or revoked sooner.       Influenza A by PCR NEGATIVE NEGATIVE Final   Influenza B by PCR NEGATIVE NEGATIVE Final    Comment: (NOTE) The Xpert Xpress SARS-CoV-2/FLU/RSV plus assay  is intended as an aid in the diagnosis of influenza from Nasopharyngeal swab specimens and should not be used as a sole basis for treatment. Nasal washings and aspirates are unacceptable for Xpert Xpress SARS-CoV-2/FLU/RSV testing.  Fact Sheet for Patients: EntrepreneurPulse.com.au  Fact Sheet for Healthcare Providers: IncredibleEmployment.be  This test is not yet approved or cleared by the [place] FDA and has been authorized for detection and/or diagnosis of SARS-CoV-2 by FDA under an Emergency Use Authorization (EUA). This EUA will remain in effect (meaning this test can be used) for the duration of the COVID-19 declaration under Section 564(b)(1) of the Act, 21 U.S.C. section 360bbb-3(b)(1), unless the authorization is terminated or revoked.  Performed at [facility], [address] [place], [place] [address]      Radiological Exams on Admission: CT ANGIO AO+BIFEM W & OR WO CONTRAST  Result Date: 06/27/2021 CLINICAL DATA:  Right leg pain, cold right foot EXAM: CT ANGIOGRAPHY AOBIFEM WITHOUT AND WITH CONTRAST TECHNIQUE: 100 cc Omnipaque 350 CONTRAST:  188m OMNIPAQUE IOHEXOL 350 MG/ML SOLN COMPARISON:  None. FINDINGS: Abdomen pelvis: The abdominal aorta is of normal caliber. No aneurysm or dissection. Mild atherosclerotic calcification. Bilateral iliac artery stenting has been performed with a proximal landing zone of the kissing stents within the distal aorta. At this level, the aorta is occluded, as are the common iliac artery stents. No periaortic inflammatory stranding. The celiac axis demonstrates normal anatomic configuration and is widely patent. Stenting of the proximal superior mesenteric artery has been performed with thrombosis of the stent and reconstitution of the mid superior mesenteric artery via the inferior pancreaticoduodenal arcade and arc of [place]. The inferior mesenteric artery is patent and hypertrophied, though  demonstrates a 50% stenosis at its origin. Single right and dual left renal arteries are widely patent and demonstrate normal vascular morphology. No aneurysm. Right lower extremity: Right common and external iliac artery stenting has been performed. The right common iliac artery is thrombosed with reconstitution of the external iliac artery via the internal iliac artery. Right lower extremity arterial outflow  is widely patent. Limited evaluation of the popliteal artery secondary to streak artifact from right total knee arthroplasty. Occlusion of the anterior tibial artery proximally. Occlusion of the a posterior tibial artery just distal to the tibioperoneal trunk. Occlusion of the peroneal artery at the level of the a mid diaphysis of the tibia. No definite distal reconstitution. Left lower extremity: Left common iliac artery and external iliac artery stenting has been performed. Common iliac artery is occluded. Reconstitution of the external iliac artery via the internal iliac artery. Left lower extremity arterial outflow is widely patent. Left popliteal artery and tibioperoneal trunk are widely patent. Anterior tibial artery is occluded at its origin. Posterior tibial artery occludes at the level of the tibial plafond but is subsequently reconstituted at the level of the plantar arch. Peroneal artery continues to supply the lateral calcaneal artery. Dorsalis pedis artery is occluded. Visualized lung bases are clear. Visualized heart and pericardium are unremarkable. Status post cholecystectomy. Moderate intra and extrahepatic biliary ductal dilation is stable from multiple prior examinations and likely represents post cholecystectomy change. Liver otherwise unremarkable. Pancreas, spleen, right adrenal gland, and kidneys are unremarkable. Nodular thickening of the left adrenal gland is unchanged. Large broad-based ventral hernia containing multiple loops of large and small bowel is unchanged. Surgical  anastomotic staple line noted within the distal small bowel. The bowel is otherwise unremarkable. No free intraperitoneal gas or fluid. Uterus absent. No adnexal masses. Bladder unremarkable. Rectum unremarkable. No pathologic adenopathy within the abdomen and pelvis. Review of the MIP images confirms the above findings. 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 50% stenosis of the IMA origin. Thrombosis of bilateral common iliac artery kissing stents with resultant thrombosis of the terminal abdominal aorta below the level of the IMA origin. Reconstitution of the external iliac arteries bilaterally via internal iliac artery collaterals. Bilateral lower extremity runoff disease. Occlusion of all 3 runoff vessels of the right lower extremity without identifiable reconstitution distal to the mid diaphysis of the tibia. Two vessel runoff to the left foot. Aortic Atherosclerosis (ICD10-I70.0). Electronically Signed   By: Fidela Salisbury M.D.   On: 06/27/2021 01:21    Assessment/Plan   1. Lower extremity arterial thrombosis; PAD   - Pt with PAD, SMA thrombosis, and hx of multiple vascular interventions presents with severe right leg pain after stopping Eliquis for ventral hernia repair 9/6 and is found to have cool and mottled right toes concerning for acute on chronic ischemia  - Vascular surgery consulted by ED physician and much appreciated  - Continue IV heparin and pain-control, follow-up vascular surgery recommendations   2. CAD  - Recent LHC with 60% stenosis involving proximal RCA and 50% 1st diagonal  - No anginal complaints  - Continue medical therapy   3. COPD  - No cough or wheezing on admission; she reports no cigarettes in 2-3 weeks  - Continue Combivent, smoking cessation    4. Ventral hernia  - Was scheduled for repair on 06/28/21  - No acute pain or  peritoneal signs   5. PAF  - In sinus rhythm on admission  - Eliquis has been on hold for upcoming surgery, currently on IV heparin, continue metoprolol     DVT prophylaxis: IV heparin  Code Status: Full  Level of Care: Level of care: Med-Surg Family Communication: None present  Disposition Plan:  Patient is from: Home  Anticipated d/c is to: TBD Anticipated d/c  date is: 06/30/21  Patient currently: Pending vascular consultation  Consults called: Vascular surgery consulted by ED physician  Admission status: Inpatient     Vianne Bulls, MD [facility]  06/27/2021, 4:51 AM

## 2021-06-27 NOTE — Consult Note (Addendum)
[facility] [place]  Patient name: Carla [name] MRN: [id number] DOB: 02-20-1961 Sex: female   REQUESTING PROVIDER:    ER   REASON FOR CONSULT:    Right leg rest pain  HISTORY OF PRESENT ILLNESS:   Carla [name] is a 60 y.o. female, who  presented to the ER with a 1 day history of right leg rest pain.  She has an extensive vascular history, and recently came off of her Eliquis in anticipation of ventral hernia repair tomorrow.  06/20/2017: Thrombolysis and mechanical thrombectomy with stenting of the superior mesenteric artery  01/28/2018: Catheter directed thrombolytic therapy and mechanical thrombectomy with angioplasty of the superior mesenteric artery  11/17/2019: Left anterior tibial angioplasty, posterior tibial angioplasty, mechanical thrombectomy (AT and PT)  11/19/2019: Mechanical thrombectomy of left common and external iliac artery, kissing 7 mm iliac stents  12/13/2020: Mechanical thrombectomy right common and external iliac artery with angioplasty  01/10/2021: Angioplasty left common iliac artery, mechanical thrombectomy right external and common iliac artery with kissing iliac stents  01/24/2021: Mechanical thrombectomy right external and common iliac artery, Viabahn stenting of right common iliac artery.  Mechanical thrombectomy of left common and external iliac artery and left iliac stenting   The patient is on a statin for hypercholesterolemia.  She is medically managed for hypertension.  She is morbidly obese with atrial fibrillation.  She states that she has quit smoking. PAST MEDICAL HISTORY    Past Medical History:  Diagnosis Date   Abdominal aortic atherosclerosis ([place])    a. 05/2017 CTA abd/pelvis: significant atherosclerotic dzs of the infrarenal abd Ao w/ some mural thrombus. No aneurysm or dissection.   Acute focal ischemia of small intestine (HCC)    Acute right-sided low back pain with right-sided  sciatica 06/13/2017   AKI (acute kidney injury) ([place]) 07/29/2017   Anginal pain ([place])    a. 08/2012 Lexiscan MV: EF 54%, non ischemia/infarct.   Anxiety and depression    Baker's cyst of knee, right    a. 07/2016 U/S: 4.1 x 1.4 x 2.9 cystic structure in R poplitetal fossa.   Bell's palsy    Borderline diabetes    CHF (congestive heart failure) (HCC)    Chronic anticoagulation    apixaban   Chronic idiopathic constipation    Chronic mesenteric ischemia (HCC)    Chronic pain    COPD (chronic obstructive pulmonary disease) (HCC)    Diastolic dysfunction    a. 05/2017 Echo: Ef 60-65%, no rwma, Gr1 DD, no source of cardiac emboli.   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 05/2017 CTA Abd/pelvis: apparent thrombus or embolus in prox SMA (70-90%); b. 05/2017 catheter directed tPA, mechanical thrombectomy, and stenting of the SMA.   Essential hypertension with goal blood pressure less than 140/90 01/19/2016   Gastroesophageal reflux disease 01/19/2016   GERD (gastroesophageal reflux disease)    H/O colectomy    History of 2019 novel coronavirus disease (COVID-19) 11/14/2019   History of kidney stones    Hyperlipidemia    Hypertension    Morbid obesity with BMI of 40.0-44.9, adult ([place])    Occlusive mesenteric ischemia ([place]) 06/19/2017   Osteoarthritis    PAD (peripheral artery disease) (HCC)    PAF (paroxysmal atrial fibrillation) (HCC)    Palpitations    Peripheral edema    Personal history of other malignant neoplasm of skin 06/01/2016   Pneumonia    Pre-diabetes    Primary osteoarthritis  of both knees 06/13/2017   SBO (small bowel obstruction) ([place]) 08/06/2017   Skin cancer of nose    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HCC)    Vitamin D deficiency      FAMILY HISTORY   Family History  Problem Relation Age of Onset   Hypertension Mother    Heart disease Mother    Heart attack Mother    Breast cancer Mother    Hypertension Father    Breast cancer  Paternal Aunt     SOCIAL HISTORY:   Social History   Socioeconomic History   Marital status: Significant Other    Spouse name: [name]    Number of children: Not on file   Years of education: Not on file   Highest education level: Not on file  Occupational History   Occupation: Unemployed  Tobacco Use   Smoking status: Some Days    Packs/day: 0.10    Types: Cigarettes   Smokeless tobacco: Never  Vaping Use   Vaping Use: Never used  Substance and Sexual Activity   Alcohol use: No   Drug use: No   Sexual activity: Not on file  Other Topics Concern   Not on file  Social History Narrative   Lives in [place] with boyfriend.  Does not routinely exercise.   Social Determinants of Health   Financial Resource Strain: Not on file  Food Insecurity: Not on file  Transportation Needs: Not on file  Physical Activity: Not on file  Stress: Not on file  Social Connections: Not on file  Intimate Partner Violence: Not on file    ALLERGIES:    Allergies  Allergen Reactions   Gabapentin     Tremors    Bactrim [Sulfamethoxazole-Trimethoprim] Itching    CURRENT MEDICATIONS:    Current Facility-Administered Medications  Medication Dose Route Frequency Provider Last Rate Last Admin   acetaminophen (TYLENOL) tablet 650 mg  650 mg Oral Q6H PRN Opyd, Ilene Qua, MD       Or   acetaminophen (TYLENOL) suppository 650 mg  650 mg Rectal Q6H PRN Opyd, Ilene Qua, MD       aspirin EC tablet 81 mg  81 mg Oral Daily Opyd, Ilene Qua, MD   81 mg at 06/27/21 [id number]   atorvastatin (LIPITOR) tablet 80 mg  80 mg Oral QHS Opyd, Timothy S, MD       heparin ADULT infusion 100 units/mL (25000 units/241m)  1,300 Units/hr Intravenous Continuous Opyd, TIlene Qua MD 13 mL/hr at 06/27/21 0240 1,300 Units/hr at 06/27/21 0240   HYDROmorphone (DILAUDID) injection 0.5-1 mg  0.5-1 mg Intravenous Q4H PRN Opyd, TIlene Qua MD   1 mg at 06/27/21 0528   Ipratropium-Albuterol (COMBIVENT) respimat 1 puff  1 puff  Inhalation Q6H PRN Opyd, TIlene Qua MD       metoprolol tartrate (LOPRESSOR) tablet 50 mg  50 mg Oral BID Opyd, TIlene Qua MD   50 mg at 06/27/21 0821   ondansetron (ZOFRAN) injection 4 mg  4 mg Intravenous Q6H PRN Opyd, TIlene Qua MD       pantoprazole (PROTONIX) EC tablet 40 mg  40 mg Oral Daily Opyd, TIlene Qua MD   40 mg at 06/27/21 [id number]   REVIEW OF SYSTEMS:   '[X]'$  denotes positive finding, '[ ]'$  denotes negative finding Cardiac  Comments:  Chest pain or chest pressure:    Shortness of breath upon exertion:    Short of breath when lying flat:    Irregular heart rhythm:  Vascular    Pain in calf, thigh, or hip brought on by ambulation: x   Pain in feet at night that wakes you up from your sleep:  x   Blood clot in your veins:    Leg swelling:         Pulmonary    Oxygen at home:    Productive cough:     Wheezing:         Neurologic    Sudden weakness in arms or legs:     Sudden numbness in arms or legs:     Sudden onset of difficulty speaking or slurred speech:    Temporary loss of vision in one eye:     Problems with dizziness:         Gastrointestinal    Blood in stool:      Vomited blood:         Genitourinary    Burning when urinating:     Blood in urine:        Psychiatric    Major depression:         Hematologic    Bleeding problems:    Problems with blood clotting too easily:        Skin    Rashes or ulcers:        Constitutional    Fever or chills:     PHYSICAL EXAM:   Vitals:   06/27/21 0330 06/27/21 0400 06/27/21 0458 06/27/21 0553  BP: 131/67 137/68  136/62  Pulse: (!) 56 63  (!) 58  Resp: '18 18  16  '$ Temp:   98.5 F (36.9 C) 97.8 F (36.6 C)  TempSrc:   Oral Oral  SpO2: 97% 98%  98%  Weight:      Height:        GENERAL: The patient is a well-nourished female, in no acute distress. The vital signs are documented above. CARDIAC: There is a regular rate and rhythm.  VASCULAR: Nonpalpable pedal or femoral pulses PULMONARY:  Nonlabored respirations ABDOMEN: Incisional hernia MUSCULOSKELETAL: There are no major deformities or cyanosis. NEUROLOGIC: No focal weakness or paresthesias are detected. SKIN: There are no ulcers or rashes noted. PSYCHIATRIC: The patient has a normal affect.  STUDIES:   I have reviewed her CT scan with the following findings: Thrombosis of the stented proximal superior mesenteric artery with reconstitution via the gastroduodenal artery/inferior pancreaticoduodenal arcade and arc of [place].   50% stenosis of the IMA origin.   Thrombosis of bilateral common iliac artery kissing stents with resultant thrombosis of the terminal abdominal aorta below the level of the IMA origin. Reconstitution of the external iliac arteries bilaterally via internal iliac artery collaterals.   Bilateral lower extremity runoff disease. Occlusion of all 3 runoff vessels of the right lower extremity without identifiable reconstitution distal to the mid diaphysis of the tibia. Two vessel runoff to the left foot. ASSESSMENT and PLAN   Right leg rest pain: The patient has chronic neuropathy.  She has acute onset of right leg rest pain.  She came off of her anticoagulation in anticipation of hernia surgery tomorrow.  She has had a 1 day history of right leg pain.  She continues to have motor function in her right and left toes.  She was admitted for IV heparin.  I had a lengthy discussion with the patient regarding options.  She is undergone multiple percutaneous interventions for limb salvage which have not had good durability.  I told her that I would speak  with Dr. Lucky Cowboy possibly about surgical revascularization given her limited durability of stenting.  She currently has rest pain with some slight mottling of her toes on the right however motor function is preserved.  She will be n.p.o. after midnight in anticipation of a intervention on Tuesday.  I did discuss that she is at high risk for amputation.   Leia Alf, MD, FACS [facility] [phone] Pager [phone]

## 2021-06-27 NOTE — Progress Notes (Addendum)
Paged about chest pain. Upper mid chest. Substernal at rest. Patient reports taking nitroglycerin at home prn for similar pain. EKG obtained and is without acute ST-T segment changes. Troponin ordered and is pending. Will order nitroglycerin and Maalox prn.  Addendum (18:26): Troponin of 893. Suggests NSTEMI. Patient with a history of stable angina. Will consult cardiology. Patient is already on Heparin IV for PAD/PAF. Continue SL nitrogen prn. Per nurse, patient's pain has resolved with nitroglycerin.  Addendum (19:04): Discussed case with Dr. Nehemiah Massed who will see patient early tomorrow (9/6) morning.  Recommend postponing vascular surgery procedure until cardiology evaluates and clears for surgery. Communicated to nursing and epic chat sent to Dr. Lucky Cowboy.  Cordelia Poche, MD [facility] 06/27/2021, 6:27 PM

## 2021-06-27 NOTE — Progress Notes (Addendum)
   06/27/21 1850  Provider Notification  Provider Name/Title Dr Cordelia Poche  Date Provider Notified 06/27/21  Time Provider Notified 1846  Notification Type Page  Notification Reason Critical result  Test performed and critical result Troponin 893  Date Critical Result Received 06/27/21  Time Critical Result Received [id number]  Provider response See new orders  Date of Provider Response 06/27/21  Time of Provider Response 1846   Pt states chest pain has subsided and does not want Nitro. Will make oncoming RN aware.

## 2021-06-27 NOTE — Plan of Care (Signed)
  Problem: Clinical Measurements: Goal: Will remain free from infection Outcome: Progressing   Problem: Pain Managment: Goal: General experience of comfort will improve Outcome: Not Progressing   Problem: Nutrition: Goal: Adequate nutrition will be maintained Outcome: Completed/Met

## 2021-06-27 NOTE — Progress Notes (Signed)
ANTICOAGULATION CONSULT NOTE   Pharmacy Consult for Heparin  Indication: DVT  Allergies  Allergen Reactions   Gabapentin     Tremors    Bactrim [Sulfamethoxazole-Trimethoprim] Itching    Patient Measurements: Height: '5\' 2"'$  (157.5 cm) Weight: 108.9 kg (240 lb) IBW/kg (Calculated) : 50.1 Heparin Dosing Weight:  76.5 kg   Vital Signs: Temp: 98.1 F (36.7 C) (09/05 1711) Temp Source: Oral (09/05 1711) BP: 150/65 (09/05 1711) Pulse Rate: 62 (09/05 1711)  Labs: Recent Labs    06/26/21 2248 06/27/21 0236 06/27/21 0557 06/27/21 0856 06/27/21 1754  HGB 13.1  --  12.5  --   --   HCT 39.7  --  38.3  --   --   PLT 200  --  207  --   --   APTT  --  25  --  65* 81*  LABPROT  --  12.3  --   --   --   INR  --  0.9  --   --   --   HEPARINUNFRC  --  <0.10*  --  0.72*  --   CREATININE 0.95  --  0.81  --   --      Estimated Creatinine Clearance: 84.7 mL/min (by C-G formula based on SCr of 0.81 mg/dL).   Medical History: Past Medical History:  Diagnosis Date   Abdominal aortic atherosclerosis ([place])    a. 05/2017 CTA abd/pelvis: significant atherosclerotic dzs of the infrarenal abd Ao w/ some mural thrombus. No aneurysm or dissection.   Acute focal ischemia of small intestine (HCC)    Acute right-sided low back pain with right-sided sciatica 06/13/2017   AKI (acute kidney injury) ([place]) 07/29/2017   Anginal pain ([place])    a. 08/2012 Lexiscan MV: EF 54%, non ischemia/infarct.   Anxiety and depression    Baker's cyst of knee, right    a. 07/2016 U/S: 4.1 x 1.4 x 2.9 cystic structure in R poplitetal fossa.   Bell's palsy    Borderline diabetes    CHF (congestive heart failure) (HCC)    Chronic anticoagulation    apixaban   Chronic idiopathic constipation    Chronic mesenteric ischemia (HCC)    Chronic pain    COPD (chronic obstructive pulmonary disease) (HCC)    Diastolic dysfunction    a. 05/2017 Echo: Ef 60-65%, no rwma, Gr1 DD, no source of cardiac emboli.   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 05/2017 CTA Abd/pelvis: apparent thrombus or embolus in prox SMA (70-90%); b. 05/2017 catheter directed tPA, mechanical thrombectomy, and stenting of the SMA.   Essential hypertension with goal blood pressure less than 140/90 01/19/2016   Gastroesophageal reflux disease 01/19/2016   GERD (gastroesophageal reflux disease)    H/O colectomy    History of 2019 novel coronavirus disease (COVID-19) 11/14/2019   History of kidney stones    Hyperlipidemia    Hypertension    Morbid obesity with BMI of 40.0-44.9, adult ([place])    Occlusive mesenteric ischemia ([place]) 06/19/2017   Osteoarthritis    PAD (peripheral artery disease) (HCC)    PAF (paroxysmal atrial fibrillation) (HCC)    Palpitations    Peripheral edema    Personal history of other malignant neoplasm of skin 06/01/2016   Pneumonia    Pre-diabetes    Primary osteoarthritis of both knees 06/13/2017   SBO (small bowel obstruction) ([place]) 08/06/2017   Skin cancer of nose    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  [place])    Vitamin D deficiency     Medications:  Medications Prior to Admission  Medication Sig Dispense Refill Last Dose   albuterol (VENTOLIN HFA) 108 (90 Base) MCG/ACT inhaler Inhale 2 puffs into the lungs every 6 (six) hours as needed for wheezing or shortness of breath.      Alpha-Lipoic Acid 600 MG CAPS Take 1 capsule (600 mg total) by mouth daily. 30 capsule 2    ALPRAZolam (XANAX) 0.5 MG tablet Take 1 tablet (0.5 mg total) by mouth 2 (two) times daily as needed for anxiety. 8 tablet 0    amLODipine (NORVASC) 5 MG tablet Take 1 tablet (5 mg total) by mouth daily. 30 tablet 2    apixaban (ELIQUIS) 5 MG TABS tablet Take 1 tablet (5 mg total) by mouth 2 (two) times daily. 60 tablet 11    aspirin EC 81 MG tablet Take 81 mg by mouth daily. Swallow whole.      atorvastatin (LIPITOR) 80 MG tablet Take 80 mg by mouth at bedtime.      COMBIVENT RESPIMAT 20-100 MCG/ACT AERS respimat  Inhale 1 puff into the lungs daily as needed for wheezing or shortness of breath.      ferrous sulfate 325 (65 FE) MG tablet Take 325 mg by mouth daily.      metoprolol tartrate (LOPRESSOR) 50 MG tablet Take 1 tablet (50 mg total) by mouth 2 (two) times daily. 30 tablet 0    omeprazole (PRILOSEC) 20 MG capsule Take 20 mg by mouth 2 (two) times daily before a meal.      pregabalin (LYRICA) 200 MG capsule Take 1 capsule (200 mg total) by mouth 2 (two) times daily. (Patient taking differently: Take 100 mg by mouth 3 (three) times daily.) 90 capsule 0    varenicline (CHANTIX) 0.5 MG tablet Take 1 tablet (0.5 mg total) by mouth 2 (two) times daily. (Patient not taking: No sig reported) 60 tablet 2     Assessment: Pharmacy consulted to dose heparin in this 60 year old female with arterial blood clots in leg and abdomen.  Pt was on Eliquis 5 mg PO BID.  Per RN, last dose was on 9/1. CrCl = 72.3 ml/min  9/5 '@0856'$  aPTT=65  HL=0.72. not correlating but close.   Order Heparin bolus 1100 units and increase drip ti 1450 units.hr 9/5 1754 aPTT = 81s, therapeutic x 1 @ 1450 units/hr    Goal of Therapy:  aPTT:  66 - 102 Heparin level 0.3-0.7 units/ml Monitor platelets by anticoagulation protocol: Yes   Plan:  9/5 1754 aPTT = 81s, therapeutic x 1 @ 1450 units/hr Continue heparin infusion at 1450 units/hr Will check confirmatory aPTT 6 hrs  And HL with am lab  Dorothe Pea, PharmD, BCPS Clinical Pharmacist   06/27/2021,6:28 PM

## 2021-06-27 NOTE — Progress Notes (Signed)
ANTICOAGULATION CONSULT NOTE   Pharmacy Consult for Heparin  Indication: DVT  Allergies  Allergen Reactions   Gabapentin     Tremors    Bactrim [Sulfamethoxazole-Trimethoprim] Itching    Patient Measurements: Height: '5\' 2"'$  (157.5 cm) Weight: 108.9 kg (240 lb) IBW/kg (Calculated) : 50.1 Heparin Dosing Weight:  76.5 kg   Vital Signs: Temp: 97.8 F (36.6 C) (09/05 0553) Temp Source: Oral (09/05 0553) BP: 136/62 (09/05 0553) Pulse Rate: 58 (09/05 0553)  Labs: Recent Labs    06/26/21 2248 06/27/21 0236 06/27/21 0557 06/27/21 0856  HGB 13.1  --  12.5  --   HCT 39.7  --  38.3  --   PLT 200  --  207  --   APTT  --  25  --  65*  LABPROT  --  12.3  --   --   INR  --  0.9  --   --   HEPARINUNFRC  --  <0.10*  --  0.72*  CREATININE 0.95  --  0.81  --      Estimated Creatinine Clearance: 84.7 mL/min (by C-G formula based on SCr of 0.81 mg/dL).   Medical History: Past Medical History:  Diagnosis Date   Abdominal aortic atherosclerosis ([place])    a. 05/2017 CTA abd/pelvis: significant atherosclerotic dzs of the infrarenal abd Ao w/ some mural thrombus. No aneurysm or dissection.   Acute focal ischemia of small intestine (HCC)    Acute right-sided low back pain with right-sided sciatica 06/13/2017   AKI (acute kidney injury) ([place]) 07/29/2017   Anginal pain ([place])    a. 08/2012 Lexiscan MV: EF 54%, non ischemia/infarct.   Anxiety and depression    Baker's cyst of knee, right    a. 07/2016 U/S: 4.1 x 1.4 x 2.9 cystic structure in R poplitetal fossa.   Bell's palsy    Borderline diabetes    CHF (congestive heart failure) (HCC)    Chronic anticoagulation    apixaban   Chronic idiopathic constipation    Chronic mesenteric ischemia (HCC)    Chronic pain    COPD (chronic obstructive pulmonary disease) (HCC)    Diastolic dysfunction    a. 05/2017 Echo: Ef 60-65%, no rwma, Gr1 DD, no source of cardiac emboli.   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  05/2017 CTA Abd/pelvis: apparent thrombus or embolus in prox SMA (70-90%); b. 05/2017 catheter directed tPA, mechanical thrombectomy, and stenting of the SMA.   Essential hypertension with goal blood pressure less than 140/90 01/19/2016   Gastroesophageal reflux disease 01/19/2016   GERD (gastroesophageal reflux disease)    H/O colectomy    History of 2019 novel coronavirus disease (COVID-19) 11/14/2019   History of kidney stones    Hyperlipidemia    Hypertension    Morbid obesity with BMI of 40.0-44.9, adult ([place])    Occlusive mesenteric ischemia ([place]) 06/19/2017   Osteoarthritis    PAD (peripheral artery disease) (HCC)    PAF (paroxysmal atrial fibrillation) (HCC)    Palpitations    Peripheral edema    Personal history of other malignant neoplasm of skin 06/01/2016   Pneumonia    Pre-diabetes    Primary osteoarthritis of both knees 06/13/2017   SBO (small bowel obstruction) ([place]) 08/06/2017   Skin cancer of nose    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HCC)    Vitamin D deficiency     Medications:  Medications Prior to Admission  Medication Sig Dispense Refill Last Dose  albuterol (VENTOLIN HFA) 108 (90 Base) MCG/ACT inhaler Inhale 2 puffs into the lungs every 6 (six) hours as needed for wheezing or shortness of breath.      Alpha-Lipoic Acid 600 MG CAPS Take 1 capsule (600 mg total) by mouth daily. 30 capsule 2    ALPRAZolam (XANAX) 0.5 MG tablet Take 1 tablet (0.5 mg total) by mouth 2 (two) times daily as needed for anxiety. 8 tablet 0    amLODipine (NORVASC) 5 MG tablet Take 1 tablet (5 mg total) by mouth daily. 30 tablet 2    apixaban (ELIQUIS) 5 MG TABS tablet Take 1 tablet (5 mg total) by mouth 2 (two) times daily. 60 tablet 11    aspirin EC 81 MG tablet Take 81 mg by mouth daily. Swallow whole.      atorvastatin (LIPITOR) 80 MG tablet Take 80 mg by mouth at bedtime.      COMBIVENT RESPIMAT 20-100 MCG/ACT AERS respimat Inhale 1 puff into the lungs daily as needed for  wheezing or shortness of breath.      ferrous sulfate 325 (65 FE) MG tablet Take 325 mg by mouth daily.      metoprolol tartrate (LOPRESSOR) 50 MG tablet Take 1 tablet (50 mg total) by mouth 2 (two) times daily. 30 tablet 0    omeprazole (PRILOSEC) 20 MG capsule Take 20 mg by mouth 2 (two) times daily before a meal.      pregabalin (LYRICA) 200 MG capsule Take 1 capsule (200 mg total) by mouth 2 (two) times daily. (Patient taking differently: Take 100 mg by mouth 3 (three) times daily.) 90 capsule 0    varenicline (CHANTIX) 0.5 MG tablet Take 1 tablet (0.5 mg total) by mouth 2 (two) times daily. (Patient not taking: No sig reported) 60 tablet 2     Assessment: Pharmacy consulted to dose heparin in this 60 year old female with arterial blood clots in leg and abdomen.  Pt was on Eliquis 5 mg PO BID.  Per RN, last dose was on 9/1. CrCl = 72.3 ml/min  9/5 '@0856'$  aPTT=65  HL=0.72. not correlating but close.   Order Heparin bolus 1100 units and increase drip ti 1450 units.hr    Goal of Therapy:  aPTT:  66 - 102 Heparin level 0.3-0.7 units/ml Monitor platelets by anticoagulation protocol: Yes   Plan:  9/5 '@0856'$  aPTT=65  HL=0.72. not correlating but close.   Order Heparin bolus 1100 units and increase drip to 1450 units.hr Will check aPTT  6 hrs after start of drip. And HL with am lab  Curlie Macken A 06/27/2021,11:04 AM

## 2021-06-27 NOTE — ED Provider Notes (Signed)
2:08 AM Assumed care for off going team.   Blood pressure 134/69, pulse 60, temperature 98.7 F (37.1 C), temperature source Oral, resp. rate 20, height '5\' 2"'$  (1.575 m), weight 108.9 kg, SpO2 97 %.  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     50% stenosis of the IMA origin.     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 Reconstitution of the external iliac arteries  bilaterally via internal iliac artery collaterals.     Bilateral lower extremity runoff disease. Occlusion of all 3 runoff  vessels of the right lower extremity without identifiable  reconstitution distal to the mid diaphysis of the tibia. Two vessel  runoff to the left foot.     Aortic Atherosclerosis (ICD10-I70.0).     We attempted calling vascular x5 as well as left a epic message.  Unable to get a hold of them so we will start heparin admit to the hospital team.  She does have some duskiness of her right toes.  .Critical Care  Date/Time: 06/27/2021 3:55 AM Performed by: Vanessa [place], MD Authorized by: Vanessa [place], MD   Critical care provider statement:    Critical care time (minutes):  45   Critical care was necessary to treat or prevent imminent or life-threatening deterioration of the following conditions: ischemic leg.   Critical care was time spent personally by me on the following activities:  Discussions with consultants, evaluation of patient's response to treatment, examination of patient, ordering and performing treatments and interventions, ordering and review of laboratory studies, ordering and review of radiographic studies, pulse oximetry, re-evaluation of patient's condition, obtaining history from  patient or surrogate and review of old charts   3:55 AM got a little vascular surgery was going to come evaluate patient.  Patient still need heparin.  Difficulty palpating pulse.  They are going to come and evaluate.  Patient was admitted to the hospital team for further work-up and management   Vanessa [place], MD 06/27/21 [phone]

## 2021-06-27 NOTE — Progress Notes (Signed)
Pt c/o 8/10 chest pain after getting up for BM. VS obtained. MD notified. Orders received. Will continue to monitor.

## 2021-06-27 NOTE — Progress Notes (Signed)
   Patient seen and examined at bedside, patient admitted after midnight, please see earlier detailed admission note by Vianne Bulls, MD. Briefly, patient presented secondary to right leg pain in setting of known PAD. Vascular surgery consulted. Heparin IV started.  Subjective: Still with right leg pain. No other issues overnight  BP 136/62 (BP Location: Left Arm)   Pulse (!) 58   Temp 97.8 F (36.6 C) (Oral)   Resp 16   Ht '5\' 2"'$  (1.575 m)   Wt 108.9 kg   SpO2 98%   BMI 43.90 kg/m   General exam: Appears calm and comfortable Respiratory system: Clear to auscultation. Respiratory effort normal. Cardiovascular system: S1 & S2 heard, RRR. Gastrointestinal system: Abdomen is nondistended, soft. Large abdominal hernia that I cannot reduce and is tender. Normal bowel sounds heard. Central nervous system: Alert and oriented. No focal neurological deficits. Musculoskeletal: Right foot is cool. No calf tenderness Skin: Cyanosis of toes of right foot/mottling. No rashes Psychiatry: Judgement and insight appear normal. Mood & affect appropriate.   Brief assessment/Plan:  PAD Rest leg pain Vascular surgery consulted with plan for surgical intervention on 9/6. CT angio from 9/5 significant for thrombosis/occlusion of multiple vessels. Patient is on Eliquis as an outpatient for which she has held secondary to upcoming abdominal surgery. -Vascular surgery recommendations: possible revascularization tomorrow (9/6) -Continue heparin IV  Ventral hernia Patient follows up with general surgery as an outpatient with plan for surgical repair  of her abdominal wall scheduled for 9/6. I have sent a message to general surgery to inform them of her hospitalization if they would like to coordinate surgery with vascular surgery  CAD COPD PAF Per H&P written this morning   DVT prophylaxis: Heparin IV Code Status:   Code Status: Full Code Family Communication: None at bedside Disposition Plan:  Likely discharge home vs SNF pending vascular and possibly general surgery recommendations in addition to possible PT/OT recommendations  Cordelia Poche, MD [facility] 06/27/2021, 2:09 PM

## 2021-06-28 ENCOUNTER — Encounter
Admission: EM | Disposition: A | Discharge status: Home / Self Care | Payer: Self-pay | Source: Home / Self Care | Attending: Internal Medicine

## 2021-06-28 ENCOUNTER — Encounter: Payer: Self-pay | Admitting: Registered Nurse

## 2021-06-28 ENCOUNTER — Telehealth (INDEPENDENT_AMBULATORY_CARE_PROVIDER_SITE_OTHER): Payer: Self-pay | Admitting: Vascular Surgery

## 2021-06-28 ENCOUNTER — Inpatient Hospital Stay: Admission: RE | Admit: 2021-06-28 | Payer: Medicare Other | Source: Home / Self Care | Admitting: Surgery

## 2021-06-28 DIAGNOSIS — I70223 Atherosclerosis of native arteries of extremities with rest pain, bilateral legs: Secondary | ICD-10-CM

## 2021-06-28 DIAGNOSIS — I743 Embolism and thrombosis of arteries of the lower extremities: Secondary | ICD-10-CM

## 2021-06-28 DIAGNOSIS — K430 Incisional hernia with obstruction, without gangrene: Secondary | ICD-10-CM

## 2021-06-28 HISTORY — DX: Anxiety disorder, unspecified: F41.9

## 2021-06-28 HISTORY — PX: LOWER EXTREMITY ANGIOGRAPHY: CATH118251

## 2021-06-28 HISTORY — DX: Long term (current) use of anticoagulants: Z79.01

## 2021-06-28 HISTORY — DX: Localized edema: R60.0

## 2021-06-28 HISTORY — DX: Depression, unspecified: F32.A

## 2021-06-28 HISTORY — DX: Unspecified malignant neoplasm of skin of nose: C44.301

## 2021-06-28 HISTORY — DX: Other chronic pain: G89.29

## 2021-06-28 HISTORY — DX: Vitamin D deficiency, unspecified: E55.9

## 2021-06-28 HISTORY — DX: Unspecified osteoarthritis, unspecified site: M19.90

## 2021-06-28 HISTORY — DX: Edema, unspecified: R60.9

## 2021-06-28 HISTORY — DX: Paroxysmal atrial fibrillation: I48.0

## 2021-06-28 HISTORY — DX: Peripheral vascular disease, unspecified: I73.9

## 2021-06-28 HISTORY — DX: Palpitations: R00.2

## 2021-06-28 LAB — CBC
HCT: 37 % (ref 36.0–46.0)
HCT: 38.3 % (ref 36.0–46.0)
HCT: 38.9 % (ref 36.0–46.0)
Hemoglobin: 12.5 g/dL (ref 12.0–15.0)
Hemoglobin: 12.7 g/dL (ref 12.0–15.0)
Hemoglobin: 12.8 g/dL (ref 12.0–15.0)
MCH: 30.6 pg (ref 26.0–34.0)
MCH: 30.2 pg (ref 26.0–34.0)
MCH: 30 pg (ref 26.0–34.0)
MCHC: 33.8 g/dL (ref 30.0–36.0)
MCHC: 33.2 g/dL (ref 30.0–36.0)
MCHC: 32.9 g/dL (ref 30.0–36.0)
MCV: 90.7 fL (ref 80.0–100.0)
MCV: 91 fL (ref 80.0–100.0)
MCV: 91.1 fL (ref 80.0–100.0)
Platelets: 183 10*3/uL (ref 150–400)
Platelets: 191 10*3/uL (ref 150–400)
Platelets: 179 10*3/uL (ref 150–400)
RBC: 4.08 MIL/uL (ref 3.87–5.11)
RBC: 4.21 MIL/uL (ref 3.87–5.11)
RBC: 4.27 MIL/uL (ref 3.87–5.11)
RDW: 15.9 % — ABNORMAL HIGH (ref 11.5–15.5)
RDW: 16.2 % — ABNORMAL HIGH (ref 11.5–15.5)
RDW: 16 % — ABNORMAL HIGH (ref 11.5–15.5)
WBC: 8.3 10*3/uL (ref 4.0–10.5)
WBC: 10.1 10*3/uL (ref 4.0–10.5)
WBC: 8.8 10*3/uL (ref 4.0–10.5)
nRBC: 0 % (ref 0.0–0.2)
nRBC: 0 % (ref 0.0–0.2)
nRBC: 0 % (ref 0.0–0.2)

## 2021-06-28 LAB — HEPARIN LEVEL (UNFRACTIONATED)
Heparin Unfractionated: 0.72 IU/mL — ABNORMAL HIGH (ref 0.30–0.70)
Heparin Unfractionated: 0.57 IU/mL (ref 0.30–0.70)

## 2021-06-28 LAB — BASIC METABOLIC PANEL
Anion gap: 6 (ref 5–15)
BUN: 18 mg/dL (ref 8–23)
CO2: 26 mmol/L (ref 22–32)
Calcium: 8.6 mg/dL — ABNORMAL LOW (ref 8.9–10.3)
Chloride: 106 mmol/L (ref 98–111)
Creatinine, Ser: 0.63 mg/dL (ref 0.44–1.00)
GFR, Estimated: >60 mL/min (ref >60)
Glucose, Bld: 118 mg/dL — ABNORMAL HIGH (ref 70–99)
Potassium: 4.1 mmol/L (ref 3.5–5.1)
Sodium: 138 mmol/L (ref 135–145)

## 2021-06-28 LAB — GLUCOSE, CAPILLARY: Glucose-Capillary: 102 mg/dL — ABNORMAL HIGH (ref 70–99)

## 2021-06-28 LAB — MRSA NEXT GEN BY PCR, NASAL: MRSA by PCR Next Gen: NOT DETECTED

## 2021-06-28 LAB — FIBRINOGEN
Fibrinogen: 440 mg/dL (ref 210–475)
Fibrinogen: 458 mg/dL (ref 210–475)

## 2021-06-28 LAB — APTT: aPTT: 82 seconds — ABNORMAL HIGH (ref 24–36)

## 2021-06-28 LAB — TROPONIN I (HIGH SENSITIVITY)
Troponin I (High Sensitivity): 868 ng/L (ref <18)
Troponin I (High Sensitivity): 735 ng/L (ref <18)

## 2021-06-28 SURGERY — REPAIR, ABDOMINAL WALL
Anesthesia: General

## 2021-06-28 SURGERY — LOWER EXTREMITY ANGIOGRAPHY
Anesthesia: Moderate Sedation | Laterality: Bilateral

## 2021-06-28 MED ORDER — SODIUM CHLORIDE 0.9 % IV SOLN: 250.0000 mL | INTRAVENOUS | Status: DC | PRN

## 2021-06-28 MED ORDER — CHLORHEXIDINE GLUCONATE CLOTH 2 % EX PADS
6.0000 | MEDICATED_PAD | Freq: Every day | CUTANEOUS | Status: DC
  Administered 2021-06-28 – 2021-07-01 (×4): 6 via TOPICAL

## 2021-06-28 MED ORDER — DIPHENHYDRAMINE HCL 12.5 MG/5ML PO ELIX
12.5000 mg | ORAL_SOLUTION | Freq: Four times a day (QID) | ORAL | Status: DC | PRN
  Filled 2021-06-28: qty 5

## 2021-06-28 MED ORDER — ALTEPLASE 2 MG IJ SOLR
INTRAMUSCULAR | Status: DC | PRN
  Administered 2021-06-28: 8 mg

## 2021-06-28 MED ORDER — HEPARIN (PORCINE) 25000 UT/250ML-% IV SOLN
400.0000 [IU]/h | INTRAVENOUS | Status: DC
  Administered 2021-06-28: 400 [IU]/h via INTRAVENOUS

## 2021-06-28 MED ORDER — MIDAZOLAM HCL 2 MG/2ML IJ SOLN
INTRAMUSCULAR | Status: DC | PRN
  Administered 2021-06-28: 2 mg via INTRAVENOUS

## 2021-06-28 MED ORDER — MORPHINE SULFATE (PF) 4 MG/ML IV SOLN
5.0000 mg | INTRAVENOUS | Status: DC | PRN
  Administered 2021-06-28: 5 mg via INTRAVENOUS
  Filled 2021-06-28: qty 2

## 2021-06-28 MED ORDER — SODIUM CHLORIDE 0.9% FLUSH
3.0000 mL | Freq: Two times a day (BID) | INTRAVENOUS | Status: DC
  Administered 2021-06-28 – 2021-06-30 (×4): 3 mL via INTRAVENOUS

## 2021-06-28 MED ORDER — FENTANYL CITRATE (PF) 100 MCG/2ML IJ SOLN
INTRAMUSCULAR | Status: DC | PRN
  Administered 2021-06-28: 50 ug via INTRAVENOUS
  Administered 2021-06-28: 25 ug via INTRAVENOUS

## 2021-06-28 MED ORDER — CEFAZOLIN SODIUM-DEXTROSE 2-4 GM/100ML-% IV SOLN
2.0000 g | Freq: Once | INTRAVENOUS | Status: AC
  Administered 2021-06-28: 2 g via INTRAVENOUS

## 2021-06-28 MED ORDER — SODIUM CHLORIDE 0.9% FLUSH: 9.0000 mL | INTRAVENOUS | Status: DC | PRN

## 2021-06-28 MED ORDER — FENTANYL 50 MCG/ML IV PCA SOLN
INTRAVENOUS | Status: DC
  Filled 2021-06-28 (×2): qty 20

## 2021-06-28 MED ORDER — SODIUM CHLORIDE 0.9 % IV SOLN: INTRAVENOUS | Status: DC

## 2021-06-28 MED ORDER — SODIUM CHLORIDE 0.9 % IV SOLN
1.0000 mg/h | INTRAVENOUS | Status: DC
  Administered 2021-06-28 (×2): 1 mg/h
  Filled 2021-06-28 (×5): qty 10

## 2021-06-28 MED ORDER — SODIUM CHLORIDE 0.9 % IV SOLN
0.5000 mg/h | INTRAVENOUS | Status: DC
  Administered 2021-06-28: 0.5 mg/h
  Filled 2021-06-28 (×2): qty 10

## 2021-06-28 MED ORDER — HEPARIN (PORCINE) 25000 UT/250ML-% IV SOLN
800.0000 [IU]/h | INTRAVENOUS | Status: DC
  Administered 2021-06-28: 800 [IU]/h via INTRAVENOUS

## 2021-06-28 MED ORDER — MIDAZOLAM HCL 5 MG/5ML IJ SOLN
INTRAMUSCULAR | Status: AC
  Filled 2021-06-28: qty 5

## 2021-06-28 MED ORDER — ONDANSETRON HCL 4 MG/2ML IJ SOLN: 4.0000 mg | Freq: Four times a day (QID) | INTRAMUSCULAR | Status: DC | PRN

## 2021-06-28 MED ORDER — SODIUM CHLORIDE 0.9 % IV SOLN
0.5000 mg/h | INTRAVENOUS | Status: DC
  Filled 2021-06-28 (×2): qty 10

## 2021-06-28 MED ORDER — HYDROMORPHONE HCL 1 MG/ML IJ SOLN
1.0000 mg | INTRAMUSCULAR | Status: DC | PRN
  Administered 2021-06-28 (×3): 2 mg via INTRAVENOUS
  Filled 2021-06-28 (×4): qty 2

## 2021-06-28 MED ORDER — FENTANYL CITRATE PF 50 MCG/ML IJ SOSY
PREFILLED_SYRINGE | INTRAMUSCULAR | Status: AC
  Filled 2021-06-28: qty 2

## 2021-06-28 MED ORDER — NALOXONE HCL 0.4 MG/ML IJ SOLN: 0.4000 mg | INTRAMUSCULAR | Status: DC | PRN

## 2021-06-28 MED ORDER — HEPARIN (PORCINE) 25000 UT/250ML-% IV SOLN
400.0000 [IU]/h | INTRAVENOUS | Status: DC
  Filled 2021-06-28: qty 250

## 2021-06-28 MED ORDER — ISOSORBIDE MONONITRATE ER 30 MG PO TB24
60.0000 mg | ORAL_TABLET | Freq: Every day | ORAL | Status: DC
  Administered 2021-06-30 – 2021-07-01 (×2): 60 mg via ORAL
  Filled 2021-06-28: qty 2
  Filled 2021-06-28: qty 1
  Filled 2021-06-28: qty 2

## 2021-06-28 MED ORDER — HEPARIN SODIUM (PORCINE) 1000 UNIT/ML IJ SOLN
INTRAMUSCULAR | Status: DC | PRN
  Administered 2021-06-28: 5000 [IU] via INTRAVENOUS

## 2021-06-28 MED ORDER — DIPHENHYDRAMINE HCL 50 MG/ML IJ SOLN: 12.5000 mg | Freq: Four times a day (QID) | INTRAMUSCULAR | Status: DC | PRN

## 2021-06-28 MED ORDER — MORPHINE SULFATE (PF) 2 MG/ML IV SOLN
INTRAVENOUS | Status: AC
  Filled 2021-06-28: qty 3

## 2021-06-28 MED ORDER — SODIUM CHLORIDE 0.9% FLUSH: 3.0000 mL | INTRAVENOUS | Status: DC | PRN

## 2021-06-28 MED ORDER — MIDAZOLAM HCL 2 MG/2ML IJ SOLN
1.0000 mg | INTRAMUSCULAR | Status: DC | PRN
  Administered 2021-06-28: 1 mg via INTRAVENOUS
  Filled 2021-06-28: qty 2

## 2021-06-28 MED ORDER — HEPARIN SODIUM (PORCINE) 1000 UNIT/ML IJ SOLN
INTRAMUSCULAR | Status: AC
  Filled 2021-06-28: qty 1

## 2021-06-28 SURGICAL SUPPLY — 16 items
CATH ANGIO 5F PIGTAIL 65CM (CATHETERS) ×2 IMPLANT
CATH INFUS 90CMX10CM (CATHETERS) ×2 IMPLANT
CATH INFUS 90CMX20CM (CATHETERS) ×2 IMPLANT
COVER PROBE U/S 5X48 (MISCELLANEOUS) ×4 IMPLANT
GLIDEWIRE ADV .035X180CM (WIRE) ×4 IMPLANT
KIT CV MULTILUMEN 7FR 20 (SET/KITS/TRAYS/PACK) ×2
KIT CV MULTILUMEN 7FR 20 SUB (SET/KITS/TRAYS/PACK) ×1 IMPLANT
KIT MICROPUNCTURE NIT STIFF (SHEATH) ×2 IMPLANT
NEEDLE ENTRY 21GA 7CM ECHOTIP (NEEDLE) ×2 IMPLANT
PACK ANGIOGRAPHY (CUSTOM PROCEDURE TRAY) ×2 IMPLANT
SHEATH BRITE TIP 5FRX11 (SHEATH) ×4 IMPLANT
SHEATH BRITE TIP 6FRX11 (SHEATH) ×2 IMPLANT
SUT PROLENE 0 CT 1 30 (SUTURE) ×2 IMPLANT
SYR MEDRAD MARK 7 150ML (SYRINGE) ×2 IMPLANT
TUBING CONTRAST HIGH PRESS 48 (TUBING) ×4 IMPLANT
WIRE GUIDERIGHT .035X150 (WIRE) ×2 IMPLANT

## 2021-06-28 NOTE — Progress Notes (Signed)
ANTICOAGULATION CONSULT NOTE   Pharmacy Consult for Heparin  Indication: DVT  Allergies  Allergen Reactions   Gabapentin     Tremors    Bactrim [Sulfamethoxazole-Trimethoprim] Itching    Patient Measurements: Height: '5\' 2"'$  (157.5 cm) Weight: 108.9 kg (240 lb) IBW/kg (Calculated) : 50.1 Heparin Dosing Weight:  76.5 kg   Vital Signs: Temp: 98.2 F (36.8 C) (09/05 2238) Temp Source: Oral (09/05 2238) BP: 104/50 (09/05 2238) Pulse Rate: 58 (09/05 2238)  Labs: Recent Labs    06/26/21 2248 06/27/21 0236 06/27/21 0236 06/27/21 0557 06/27/21 0856 06/27/21 1754 06/27/21 1948 06/27/21 2246  HGB 13.1  --   --  12.5  --   --   --   --   HCT 39.7  --   --  38.3  --   --   --   --   PLT 200  --   --  207  --   --   --   --   APTT  --  25   < >  --  65* 81*  --  72*  LABPROT  --  12.3  --   --   --   --   --   --   INR  --  0.9  --   --   --   --   --   --   HEPARINUNFRC  --  <0.10*  --   --  0.72*  --   --   --   CREATININE 0.95  --   --  0.81  --   --   --   --   TROPONINIHS  --   --   --   --   --  893* 1,587* 1,748*   < > = values in this interval not displayed.     Estimated Creatinine Clearance: 84.7 mL/min (by C-G formula based on SCr of 0.81 mg/dL).   Medical History: Past Medical History:  Diagnosis Date   Abdominal aortic atherosclerosis ([place])    a. 05/2017 CTA abd/pelvis: significant atherosclerotic dzs of the infrarenal abd Ao w/ some mural thrombus. No aneurysm or dissection.   Acute focal ischemia of small intestine (HCC)    Acute right-sided low back pain with right-sided sciatica 06/13/2017   AKI (acute kidney injury) ([place]) 07/29/2017   Anginal pain ([place])    a. 08/2012 Lexiscan MV: EF 54%, non ischemia/infarct.   Anxiety and depression    Baker's cyst of knee, right    a. 07/2016 U/S: 4.1 x 1.4 x 2.9 cystic structure in R poplitetal fossa.   Bell's palsy    Borderline diabetes    CHF (congestive heart failure) (HCC)    Chronic anticoagulation     apixaban   Chronic idiopathic constipation    Chronic mesenteric ischemia (HCC)    Chronic pain    COPD (chronic obstructive pulmonary disease) (HCC)    Diastolic dysfunction    a. 05/2017 Echo: Ef 60-65%, no rwma, Gr1 DD, no source of cardiac emboli.   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 05/2017 CTA Abd/pelvis: apparent thrombus or embolus in prox SMA (70-90%); b. 05/2017 catheter directed tPA, mechanical thrombectomy, and stenting of the SMA.   Essential hypertension with goal blood pressure less than 140/90 01/19/2016   Gastroesophageal reflux disease 01/19/2016   GERD (gastroesophageal reflux disease)    H/O colectomy    History of 2019 novel coronavirus disease (COVID-19) 11/14/2019  History of kidney stones    Hyperlipidemia    Hypertension    Morbid obesity with BMI of 40.0-44.9, adult (HCC)    Occlusive mesenteric ischemia ([place]) 06/19/2017   Osteoarthritis    PAD (peripheral artery disease) (HCC)    PAF (paroxysmal atrial fibrillation) (HCC)    Palpitations    Peripheral edema    Personal history of other malignant neoplasm of skin 06/01/2016   Pneumonia    Pre-diabetes    Primary osteoarthritis of both knees 06/13/2017   SBO (small bowel obstruction) ([place]) 08/06/2017   Skin cancer of nose    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HCC)    Vitamin D deficiency     Medications:  Medications Prior to Admission  Medication Sig Dispense Refill Last Dose   albuterol (VENTOLIN HFA) 108 (90 Base) MCG/ACT inhaler Inhale 2 puffs into the lungs every 6 (six) hours as needed for wheezing or shortness of breath.      Alpha-Lipoic Acid 600 MG CAPS Take 1 capsule (600 mg total) by mouth daily. 30 capsule 2    ALPRAZolam (XANAX) 0.5 MG tablet Take 1 tablet (0.5 mg total) by mouth 2 (two) times daily as needed for anxiety. 8 tablet 0    amLODipine (NORVASC) 5 MG tablet Take 1 tablet (5 mg total) by mouth daily. 30 tablet 2    apixaban (ELIQUIS) 5 MG TABS tablet  Take 1 tablet (5 mg total) by mouth 2 (two) times daily. 60 tablet 11    aspirin EC 81 MG tablet Take 81 mg by mouth daily. Swallow whole.      atorvastatin (LIPITOR) 80 MG tablet Take 80 mg by mouth at bedtime.      COMBIVENT RESPIMAT 20-100 MCG/ACT AERS respimat Inhale 1 puff into the lungs daily as needed for wheezing or shortness of breath.      ferrous sulfate 325 (65 FE) MG tablet Take 325 mg by mouth daily.      metoprolol tartrate (LOPRESSOR) 50 MG tablet Take 1 tablet (50 mg total) by mouth 2 (two) times daily. 30 tablet 0    omeprazole (PRILOSEC) 20 MG capsule Take 20 mg by mouth 2 (two) times daily before a meal.      pregabalin (LYRICA) 200 MG capsule Take 1 capsule (200 mg total) by mouth 2 (two) times daily. (Patient taking differently: Take 100 mg by mouth 3 (three) times daily.) 90 capsule 0    varenicline (CHANTIX) 0.5 MG tablet Take 1 tablet (0.5 mg total) by mouth 2 (two) times daily. (Patient not taking: No sig reported) 60 tablet 2     Assessment: Pharmacy consulted to dose heparin in this 60 year old female with arterial blood clots in leg and abdomen.  Pt was on Eliquis 5 mg PO BID.  Per RN, last dose was on 9/1. CrCl = 72.3 ml/min  9/5 '@0856'$  aPTT=65  HL=0.72. not correlating but close.   Order Heparin bolus 1100 units and increase drip ti 1450 units.hr 9/5 1754 aPTT = 81s, therapeutic x 1 @ 1450 units/hr 9/5 2246 aPTT = 72s, therapeutic x 2    Goal of Therapy:  aPTT:  66 - 102 Heparin level 0.3-0.7 units/ml Monitor platelets by anticoagulation protocol: Yes   Plan:  Continue heparin infusion at 1450 units/hr Will recheck aPTT daily with AM labs until correlation with HL occurs.  And HL with am lab  Renda Rolls, PharmD, [facility] 06/28/2021 1:33 AM

## 2021-06-28 NOTE — Telephone Encounter (Signed)
pt called from [facility] (currently admitted) stating that she needed Dr. Lucky Cowboy to come see her and give her something for pain. States that she "can't take it anymore". After speaking with Dr. Lucky Cowboy, he will be going to [facility] sometime today (currently in clinic) . I instructed pt to talk to her providers in the hospital and let them know regarding pain levels. I advised pt that Dr. Lucky Cowboy will be coming to [facility] but unsure of time. Pt acknowledged and nothing further needed a this time.

## 2021-06-28 NOTE — Op Note (Signed)
[facility]  Percutaneous Study/Intervention Procedural Note   Date of Surgery: 06/28/2021  Surgeon(s):Trebor Galdamez    Assistants:none  Pre-operative Diagnosis: PAD with rest pain bilateral lower extremities  Post-operative diagnosis:  Same  Procedure(s) Performed:             1.  Ultrasound guidance for vascular access bilateral femoral artery             2.  Catheter placement into aorta from bilateral femoral approaches             3.  Aortogram and selective bilateral iliofemoral angiograms              4.  Placement of 8 mg of tPA, 4 mg in both iliac arteries  5.  Placement of infusion for thrombolysis catheters into bilateral common iliac arteries and up into the aorta using a 90 cm total length, 20 cm working length catheter on the right and a 90 cm total length 10 cm working length catheter on the left  6.  Placement of a right femoral venous triple-lumen catheter with the help of ultrasound guidance  EBL: 5 cc  Contrast: 45 cc  Fluoro Time: 0.9 minutes  Moderate Conscious Sedation Time: approximately 45 minutes using 2 mg of Versed and 75 mcg of Fentanyl              Indications:  Patient is a 60 y.o.female with recurrent ischemia both lower extremities worse on the right than the left. The patient has recently stopped her anticoagulation for upcoming general surgery that was planned for today but has had to be postponed due to her ischemia.  A CT scan showed occlusion of her previously placed iliac stents bilaterally.  The patient is brought in for angiography for further evaluation and potential treatment.  Due to the limb threatening nature of the situation, angiogram was performed for attempted limb salvage. The patient is aware that if the procedure fails, amputation would be expected.  The patient also understands that even with successful revascularization, amputation may still be required due to the severity of the situation. Risks and benefits are  discussed and informed consent is obtained.   Procedure:  The patient was identified and appropriate procedural time out was performed.  The patient was then placed supine on the table and prepped and draped in the usual sterile fashion. Moderate conscious sedation was administered during a face to face encounter with the patient throughout the procedure with my supervision of the RN administering medicines and monitoring the patient's vital signs, pulse oximetry, telemetry and mental status throughout from the start of the procedure until the patient was taken to the recovery room. Ultrasound was used to evaluate the right common femoral artery.  It was patent but small.  A digital ultrasound image was acquired.  A Seldinger needle was used to access the right common femoral artery under direct ultrasound guidance and a permanent image was performed.  A 0.035 J wire was advanced without resistance and a 5Fr sheath was placed.  Pigtail catheter was placed into the aorta and an AP aortogram was performed. This demonstrated normal renal arteries and normal aorta and the proximal and mid infrarenal location with occlusion of the distal aorta just above the previously placed iliac stents which were already built up into the aorta.  The iliac stents were occluded on both sides.  There was reconstitution with what appeared to be fairly normal external iliac arteries below the stents.  I then performed imaging through the right femoral sheath to evaluate the common femoral artery which was patent as was the femoral bifurcation and the proximal to mid SFA and the right profunda femoris artery. I then accessed the left common femoral artery under direct ultrasound guidance with a micropuncture needle.  A micropuncture wire and sheath were placed and we upsized to a 5 French sheath on the left as well.  Permanent image was recorded.   The patient was heparinized.  I then crossed the occlusion in the left iliac stents  without difficulty placing a catheter into the aorta.  Intraluminal flow was confirmed bilaterally.  Imaging was performed through the left femoral sheath to evaluate the femoral arteries and femoral bifurcation. The left common femoral artery, profunda femoris artery, and the proximal to mid left SFA were patent without significant stenosis.  I felt this patient would be best served by a continuous infusion of thrombolytic therapy overnight to remove the thrombus and then determine if further intervention will be necessary to her stents.  On the right, a 90 cm total length 20 cm working length thrombolytic catheter was placed into the mid infrarenal aorta down to the right external iliac artery.  On the left, a 90 cm total length 10 cm working length thrombolytic catheter was placed into the distal aorta and across the stents down to the left external iliac artery.  4 mg of tPA were then given through both of these thrombolytic catheters and a continuous infusion of tPA will be run overnight.  The sheaths and the catheters were secured with silk sutures.  To ensure durable venous access, a triple-lumen catheter was placed.  The right femoral vein was visualized with ultrasound and found to be patent.  It was then accessed under direct ultrasound guidance without difficulty with a Seldinger needle and a permanent image was recorded.  A J-wire was placed.  After skin nick and dilatation a triple-lumen catheter was placed over the wire and the wire was removed.  All 3 lm withdrew dark red nonpulsatile blood and flushed easily with sterile saline.  It was then secured in place with 3 silk sutures.. The patient was taken to the recovery room in stable condition having tolerated the procedure well.  Findings:               Aortogram:  This demonstrated normal renal arteries and normal aorta and the proximal and mid infrarenal location with occlusion of the distal aorta just above the previously placed iliac stents  which were already built up into the aorta.  The iliac stents were occluded on both sides.  There was reconstitution with what appeared to be fairly normal external iliac arteries below the stents.             Right lower Extremity:  the common femoral artery which was patent as was the femoral bifurcation and the proximal to mid SFA and the right profunda femoris artery.  Left lower extremity: The left common femoral artery, profunda femoris artery, and the proximal to mid left SFA were patent without significant stenosis.   Disposition: Patient was taken to the recovery room in stable condition having tolerated the procedure well.  Complications: None  [name] 06/28/2021 4:44 PM   This note was created with Dragon Medical transcription system. Any errors in dictation are purely unintentional.

## 2021-06-28 NOTE — Progress Notes (Signed)
ANTICOAGULATION CONSULT NOTE   Pharmacy Consult for Heparin  Indication: DVT  Allergies  Allergen Reactions   Gabapentin     Tremors    Bactrim [Sulfamethoxazole-Trimethoprim] Itching    Patient Measurements: Height: '5\' 2"'$  (157.5 cm) Weight: 108.9 kg (240 lb) IBW/kg (Calculated) : 50.1 Heparin Dosing Weight:  76.5 kg   Vital Signs: Temp: 98.2 F (36.8 C) (09/06 1246) Temp Source: Oral (09/06 0400) BP: 160/91 (09/06 1246) Pulse Rate: 83 (09/06 1246)  Labs: Recent Labs    06/26/21 2248 06/26/21 2248 06/27/21 0236 06/27/21 0557 06/27/21 0856 06/27/21 1754 06/27/21 1948 06/27/21 2246 06/28/21 0426 06/28/21 0600 06/28/21 1117  HGB 13.1  --   --  12.5  --   --   --   --  12.5  --   --   HCT 39.7  --   --  38.3  --   --   --   --  37.0  --   --   PLT 200  --   --  207  --   --   --   --  183  --   --   APTT  --    < > 25  --  65* 81*  --  72* 82*  --   --   LABPROT  --   --  12.3  --   --   --   --   --   --   --   --   INR  --   --  0.9  --   --   --   --   --   --   --   --   HEPARINUNFRC  --    < > <0.10*  --  0.72*  --   --   --  0.72*  --  0.57  CREATININE 0.95  --   --  0.81  --   --   --   --  0.63  --   --   TROPONINIHS  --   --   --   --   --  893*   < > 1,748* 868* 735*  --    < > = values in this interval not displayed.     Estimated Creatinine Clearance: 85.8 mL/min (by C-G formula based on SCr of 0.63 mg/dL).   Medical History: Past Medical History:  Diagnosis Date   Abdominal aortic atherosclerosis ([place])    a. 05/2017 CTA abd/pelvis: significant atherosclerotic dzs of the infrarenal abd Ao w/ some mural thrombus. No aneurysm or dissection.   Acute focal ischemia of small intestine (HCC)    Acute right-sided low back pain with right-sided sciatica 06/13/2017   AKI (acute kidney injury) ([place]) 07/29/2017   Anginal pain ([place])    a. 08/2012 Lexiscan MV: EF 54%, non ischemia/infarct.   Anxiety and depression    Baker's cyst of knee, right    a.  07/2016 U/S: 4.1 x 1.4 x 2.9 cystic structure in R poplitetal fossa.   Bell's palsy    Borderline diabetes    CHF (congestive heart failure) (HCC)    Chronic anticoagulation    apixaban   Chronic idiopathic constipation    Chronic mesenteric ischemia (HCC)    Chronic pain    COPD (chronic obstructive pulmonary disease) (HCC)    Diastolic dysfunction    a. 05/2017 Echo: Ef 60-65%, no rwma, Gr1 DD, no source of cardiac emboli.   Dyspnea    Embolus of  superior mesenteric artery ([place])    a. 05/2017 CTA Abd/pelvis: apparent thrombus or embolus in prox SMA (70-90%); b. 05/2017 catheter directed tPA, mechanical thrombectomy, and stenting of the SMA.   Essential hypertension with goal blood pressure less than 140/90 01/19/2016   Gastroesophageal reflux disease 01/19/2016   GERD (gastroesophageal reflux disease)    H/O colectomy    History of 2019 novel coronavirus disease (COVID-19) 11/14/2019   History of kidney stones    Hyperlipidemia    Hypertension    Morbid obesity with BMI of 40.0-44.9, adult ([place])    Occlusive mesenteric ischemia ([place]) 06/19/2017   Osteoarthritis    PAD (peripheral artery disease) (HCC)    PAF (paroxysmal atrial fibrillation) (HCC)    Palpitations    Peripheral edema    Personal history of other malignant neoplasm of skin 06/01/2016   Pneumonia    Pre-diabetes    Primary osteoarthritis of both knees 06/13/2017   SBO (small bowel obstruction) ([place]) 08/06/2017   Skin cancer of nose    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HCC)    Vitamin D deficiency     Medications:  Medications Prior to Admission  Medication Sig Dispense Refill Last Dose   albuterol (VENTOLIN HFA) 108 (90 Base) MCG/ACT inhaler Inhale 2 puffs into the lungs every 6 (six) hours as needed for wheezing or shortness of breath.      Alpha-Lipoic Acid 600 MG CAPS Take 1 capsule (600 mg total) by mouth daily. 30 capsule 2    ALPRAZolam (XANAX) 0.5 MG tablet Take 1 tablet (0.5 mg total) by mouth 2  (two) times daily as needed for anxiety. 8 tablet 0    amLODipine (NORVASC) 5 MG tablet Take 1 tablet (5 mg total) by mouth daily. 30 tablet 2    apixaban (ELIQUIS) 5 MG TABS tablet Take 1 tablet (5 mg total) by mouth 2 (two) times daily. 60 tablet 11    aspirin EC 81 MG tablet Take 81 mg by mouth daily. Swallow whole.      atorvastatin (LIPITOR) 80 MG tablet Take 80 mg by mouth at bedtime.      COMBIVENT RESPIMAT 20-100 MCG/ACT AERS respimat Inhale 1 puff into the lungs daily as needed for wheezing or shortness of breath.      ferrous sulfate 325 (65 FE) MG tablet Take 325 mg by mouth daily.      metoprolol tartrate (LOPRESSOR) 50 MG tablet Take 1 tablet (50 mg total) by mouth 2 (two) times daily. 30 tablet 0    omeprazole (PRILOSEC) 20 MG capsule Take 20 mg by mouth 2 (two) times daily before a meal.      pregabalin (LYRICA) 200 MG capsule Take 1 capsule (200 mg total) by mouth 2 (two) times daily. (Patient taking differently: Take 100 mg by mouth 3 (three) times daily.) 90 capsule 0    varenicline (CHANTIX) 0.5 MG tablet Take 1 tablet (0.5 mg total) by mouth 2 (two) times daily. (Patient not taking: No sig reported) 60 tablet 2     Assessment: Pharmacy consulted to dose heparin in this 60 year old female with arterial blood clots in leg and abdomen.  Pt was on Eliquis 5 mg PO BID.  Per RN, last dose was on 9/1. Heparin Dosing Weight:  76.5 kg     Date Time aPTT/HL Rate/Comment 9/5   0856  65s / 0.72.    Inc to 1450 un/hr. (not correlating but close.) 9/5  1754   81s /   therapeutic  x 1 @ 1450 units/hr 9/5  2246  72s /   therapeutic x 2 9/6  0426         / 0.72  slightly supratherapeutic; 1450>1350 un/hr   9/6 1117       / 0.57 Thera x1; 1350 un/hr  Baseline Labs: aPTT - 25s INR - 0.9 Hgb - 13.1>12.5 Plts - 200>183  Goal of Therapy:  aPTT:  66 - 102 Heparin level 0.3-0.7 units/ml Monitor platelets by anticoagulation protocol: Yes   Plan:  Heparin level therapeutic x1. Titrate  by HL alone now that aPTT/HL are correlating. Pt planning vasc surgery intervention angiogram +/- thrombolytic Tx +/- amputation based on outcomes. Continue heparin infusion at 1350 units/hr Recheck confirmatory heparin level in 6hrs (1730 9/06) to assess rate then daily if consecutively therapeutic. CTM CBC daily while on heparin.  Lorna Dibble, RPH,BCCP Clinical Pharmacist 06/28/2021 12:58 PM

## 2021-06-28 NOTE — Progress Notes (Signed)
[facility] [place] NAME: Carla [name]    MR#:  [id number]  DATE OF BIRTH:  06/14/1961  SUBJECTIVE:   patient tearful with significant right lower extremity pain. Denies any chest pain today. REVIEW OF SYSTEMS:   Review of Systems  Constitutional:  Negative for chills, fever and weight loss.  HENT:  Negative for ear discharge, ear pain and nosebleeds.   Eyes:  Negative for blurred vision, pain and discharge.  Respiratory:  Negative for sputum production, shortness of breath, wheezing and stridor.   Cardiovascular:  Negative for chest pain, palpitations, orthopnea and PND.  Gastrointestinal:  Negative for abdominal pain, diarrhea, nausea and vomiting.  Genitourinary:  Negative for frequency and urgency.  Musculoskeletal:  Negative for back pain and joint pain.       Right leg constant pain  Neurological:  Negative for sensory change, speech change, focal weakness and weakness.  Psychiatric/Behavioral:  Negative for depression and hallucinations. The patient is not nervous/anxious.   Tolerating Diet:npo Tolerating PT:   DRUG ALLERGIES:   Allergies  Allergen Reactions  . Gabapentin     Tremors   . Bactrim [Sulfamethoxazole-Trimethoprim] Itching    VITALS:  Blood pressure (!) 141/71, pulse 72, temperature 97.7 F (36.5 C), temperature source Oral, resp. rate 13, height '5\' 2"'$  (1.575 m), weight 108.9 kg, SpO2 95 %.  PHYSICAL EXAMINATION:   Physical Exam  GENERAL:  60 y.o.-year-old patient lying in the bed with no acute distress.  LUNGS: Normal breath sounds bilaterally, no wheezing, rales, rhonchi. No use of accessory muscles of respiration.  CARDIOVASCULAR: S1, S2 normal. No murmurs, rubs, or gallops.  ABDOMEN: Soft, nontender, large ventral hernia + chronic  Bowel sounds present. No organomegaly or mass.  EXTREMITIES: dusky hue right LE NEUROLOGIC: non focal PSYCHIATRIC:  patient is alert and oriented x 3.  SKIN: No obvious  rash, lesion, or ulcer.   LABORATORY PANEL:  CBC Recent Labs  Lab 06/28/21 0426  WBC 8.3  HGB 12.5  HCT 37.0  PLT 183    Chemistries  Recent Labs  Lab 06/28/21 0426  NA 138  K 4.1  CL 106  CO2 26  GLUCOSE 118*  BUN 18  CREATININE 0.63  CALCIUM 8.6*   Cardiac Enzymes No results for input(s): TROPONINI in the last 168 hours. RADIOLOGY:  CT ANGIO AO+BIFEM W & OR WO CONTRAST  Result Date: 06/27/2021 CLINICAL DATA:  Right leg pain, cold right foot EXAM: CT ANGIOGRAPHY AOBIFEM WITHOUT AND WITH CONTRAST TECHNIQUE: 100 cc Omnipaque 350 CONTRAST:  182m OMNIPAQUE IOHEXOL 350 MG/ML SOLN COMPARISON:  None. FINDINGS: Abdomen pelvis: The abdominal aorta is of normal caliber. No aneurysm or dissection. Mild atherosclerotic calcification. Bilateral iliac artery stenting has been performed with a proximal landing zone of the kissing stents within the distal aorta. At this level, the aorta is occluded, as are the common iliac artery stents. No periaortic inflammatory stranding. The celiac axis demonstrates normal anatomic configuration and is widely patent. Stenting of the proximal superior mesenteric artery has been performed with thrombosis of the stent and reconstitution of the mid superior mesenteric artery via the inferior pancreaticoduodenal arcade and arc of Riolan. The inferior mesenteric artery is patent and hypertrophied, though demonstrates a 50% stenosis at its origin. Single right and dual left renal arteries are widely patent and demonstrate normal vascular morphology. No aneurysm. Right lower extremity: Right common and external iliac artery stenting has been performed. The right common iliac artery is thrombosed  with reconstitution of the external iliac artery via the internal iliac artery. Right lower extremity arterial outflow is widely patent. Limited evaluation of the popliteal artery secondary to streak artifact from right total knee arthroplasty. Occlusion of the anterior tibial  artery proximally. Occlusion of the a posterior tibial artery just distal to the tibioperoneal trunk. Occlusion of the peroneal artery at the level of the a mid diaphysis of the tibia. No definite distal reconstitution. Left lower extremity: Left common iliac artery and external iliac artery stenting has been performed. Common iliac artery is occluded. Reconstitution of the external iliac artery via the internal iliac artery. Left lower extremity arterial outflow is widely patent. Left popliteal artery and tibioperoneal trunk are widely patent. Anterior tibial artery is occluded at its origin. Posterior tibial artery occludes at the level of the tibial plafond but is subsequently reconstituted at the level of the plantar arch. Peroneal artery continues to supply the lateral calcaneal artery. Dorsalis pedis artery is occluded. Visualized lung bases are clear. Visualized heart and pericardium are unremarkable. Status post cholecystectomy. Moderate intra and extrahepatic biliary ductal dilation is stable from multiple prior examinations and likely represents post cholecystectomy change. Liver otherwise unremarkable. Pancreas, spleen, right adrenal gland, and kidneys are unremarkable. Nodular thickening of the left adrenal gland is unchanged. Large broad-based ventral hernia containing multiple loops of large and small bowel is unchanged. Surgical anastomotic staple line noted within the distal small bowel. The bowel is otherwise unremarkable. No free intraperitoneal gas or fluid. Uterus absent. No adnexal masses. Bladder unremarkable. Rectum unremarkable. No pathologic adenopathy within the abdomen and pelvis. Review of the MIP images confirms the above findings. 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 50% stenosis of the IMA origin. Thrombosis of bilateral common iliac artery  kissing stents with resultant thrombosis of the terminal abdominal aorta below the level of the IMA origin. Reconstitution of the external iliac arteries bilaterally via internal iliac artery collaterals. Bilateral lower extremity runoff disease. Occlusion of all 3 runoff vessels of the right lower extremity without identifiable reconstitution distal to the mid diaphysis of the tibia. Two vessel runoff to the left foot. Aortic Atherosclerosis (ICD10-I70.0). Electronically Signed   By: Fidela Salisbury M.D.   On: 06/27/2021 01:21   ASSESSMENT AND PLAN:   ALEGRIA STAKES is a 60 y.o. female with medical history significant for peripheral arterial disease status post multiple stents and thrombectomies, SMA thrombosis status post remote intervention, CAD, PAF on Eliquis, COPD, and recurrent ventral hernia scheduled for repair on 06/28/2021, now presenting to the emergency department for evaluation of severe right leg pain.  The patient reports that she has been holding Eliquis for the upcoming surgery and then developed pain in the right leg yesterday morning that has become increasingly severe.     CTA LE notable for thrombosis of stented proximal SMA with reconstitution, thrombosis of bilateral iliac artery stents with resultant thrombosis of abdominal aorta and bilateral lower extremity runoff disease with occlusion of all 3 RLE runoffs.  BIalteral Lower extremity arterial thrombosis; Right > left with severe PAD   - Pt with PAD, SMA thrombosis, and hx of multiple vascular interventions presents with severe right leg pain after stopping Eliquis for ventral hernia repair 9/6 and is found to have cool and mottled right toes concerning for acute on chronic ischemia  - Vascular surgery consulted  - Continue IV heparin and pain-control -- patient to undergo  lower extremity angiogram today.    CAD  - Recent LHC with 60% stenosis involving proximal RCA and 50% 1st diagonal  - No anginal complaints today - Continue  medical therapy  -- patient seen by Dr. Nehemiah Massed. No cardiac evaluation recommended at present    COPD  - No cough or wheezing on admission; she reports no cigarettes in 2-3 weeks  - Continue Combivent, smoking cessation     chronic ventral hernia  - Was scheduled for repair on 06/28/21 -- canceled. Patient follows with Dr. Dahlia Byes - No acute pain or peritoneal signs    PAF  - In sinus rhythm on admission  - Eliquis has been on hold for upcoming surgery, currently on IV heparin, continue metoprolol     HL -- continue statins    Procedures: LE angiogram 9/6 Family communication : none Consults : vascular, cardiology CODE STATUS: full DVT Prophylaxis : heparin drip Level of care: Progressive Cardiac Status is: Inpatient  Remains inpatient appropriate because:Inpatient level of care appropriate due to severity of illness  Dispo: The patient is from: Home              Anticipated d/c is to: Home              Patient currently is not medically stable to d/c.   Difficult to place patient No        TOTAL TIME TAKING CARE OF THIS PATIENT: 35 minutes.  >50% time spent on counselling and coordination of care  Note: This dictation was prepared with Dragon dictation along with smaller phrase technology. Any transcriptional errors that result from this process are unintentional.  Fritzi Mandes M.D    [facility]   CC: Primary care physician; Kirk Ruths, MD Patient ID: MONTINIQUE CZARNY, female   DOB: 07/28/1961, 60 y.o.   MRN: [id number]

## 2021-06-28 NOTE — Consult Note (Signed)
Patient ID: NOELIE NUNNERY, female   DOB: [birth year], 60 y.o.   MRN: [id number]  HPI FYNLEIGH NOEL is a 60 y.o. female with history of incisional ventral hernia w loss of Domain with significant symptoms.  sHe was scheduled for elective hernia repair today But came in the night of 9/4 due to severe lower extremity pain, right worse than left. Severe and constant c/w critical limb ischemia. She did have a CT that I have pers reviewed showing well known hernia w loss of domain, SMA stent occluded and thrombosis bilat iliac stents. She has been placed on heparin drip and is scheduled to undergo angio and revascularization. She also experience Chest pain w increase Trop and thought to be Non stemi demand/ischemia.CBC  and bmp nml    HPI  Past Medical History:  Diagnosis Date   Abdominal aortic atherosclerosis ([place])    a. 05/2017 CTA abd/pelvis: significant atherosclerotic dzs of the infrarenal abd Ao w/ some mural thrombus. No aneurysm or dissection.   Acute focal ischemia of small intestine (HCC)    Acute right-sided low back pain with right-sided sciatica 06/13/2017   AKI (acute kidney injury) ([place]) 07/29/2017   Anginal pain ([place])    a. 08/2012 Lexiscan MV: EF 54%, non ischemia/infarct.   Anxiety and depression    Baker's cyst of knee, right    a. 07/2016 U/S: 4.1 x 1.4 x 2.9 cystic structure in R poplitetal fossa.   Bell's palsy    Borderline diabetes    CHF (congestive heart failure) (HCC)    Chronic anticoagulation    apixaban   Chronic idiopathic constipation    Chronic mesenteric ischemia (HCC)    Chronic pain    COPD (chronic obstructive pulmonary disease) (HCC)    Diastolic dysfunction    a. 05/2017 Echo: Ef 60-65%, no rwma, Gr1 DD, no source of cardiac emboli.   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 05/2017 CTA Abd/pelvis: apparent thrombus or embolus in prox SMA (70-90%); b. 05/2017 catheter directed tPA, mechanical thrombectomy, and stenting of the SMA.    Essential hypertension with goal blood pressure less than 140/90 01/19/2016   Gastroesophageal reflux disease 01/19/2016   GERD (gastroesophageal reflux disease)    H/O colectomy    History of 2019 novel coronavirus disease (COVID-19) 11/14/2019   History of kidney stones    Hyperlipidemia    Hypertension    Morbid obesity with BMI of 40.0-44.9, adult ([place])    Occlusive mesenteric ischemia ([place]) 06/19/2017   Osteoarthritis    PAD (peripheral artery disease) (HCC)    PAF (paroxysmal atrial fibrillation) (HCC)    Palpitations    Peripheral edema    Personal history of other malignant neoplasm of skin 06/01/2016   Pneumonia    Pre-diabetes    Primary osteoarthritis of both knees 06/13/2017   SBO (small bowel obstruction) ([place]) 08/06/2017   Skin cancer of nose    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HCC)    Vitamin D deficiency     Past Surgical History:  Procedure Laterality Date   AMPUTATION TOE Left 12/09/2019   Procedure: AMPUTATION TOE MPJ left;  Surgeon: Caroline More, DPM;  Location: [facility];  Service: Podiatry;  Laterality: Left;   APPENDECTOMY     CHOLECYSTECTOMY     COLON SURGERY     EMBOLECTOMY OR THROMBECTOMY, WITH OR WITHOUT CATHETER; FEMOROPOPLITEAL, AORTOILIAC ARTERY, BY LEG INCISION N/A 06/21/2020   Left - Decomp Fasciotomy Leg; Ant &/Or Lat  Compart Only; Midline - Angiography, Visceral, Selective Or Supraselective (With Or Without Flush Aortogram); Left - Revascularize, Endovasc, Open/Percut, Iliac Artery, Unilat, Initial Vessel; W/Translum Stent, W/Angioplasty; Right - Revascularize, Endovasc, Open/Percut, Iliac Artery, Ea Add`L Ipsilateral; W/Translumin Stent, W/Angioplasty; Location [facility];   LAPAROTOMY N/A 08/08/2017   Procedure: EXPLORATORY LAPAROTOMY POSSIBLE BOWEL RESECTION;  Surgeon: Jules Husbands, MD;  Location: [facility];  Service: General;  Laterality: N/A;   LEFT HEART CATH AND CORONARY ANGIOGRAPHY N/A 05/17/2021   Procedure: LEFT HEART CATH AND CORONARY  ANGIOGRAPHY;  Surgeon: Yolonda Kida, MD;  Location: [facility];  Service: Cardiovascular;  Laterality: N/A;   LOWER EXTREMITY ANGIOGRAPHY Left 11/17/2019   Procedure: LOWER EXTREMITY ANGIOGRAPHY;  Surgeon: Algernon Huxley, MD;  Location: [facility];  Service: Cardiovascular;  Laterality: Left;   LOWER EXTREMITY ANGIOGRAPHY Right 12/13/2020   Procedure: LOWER EXTREMITY ANGIOGRAPHY;  Surgeon: Algernon Huxley, MD;  Location: [facility];  Service: Cardiovascular;  Laterality: Right;   LOWER EXTREMITY ANGIOGRAPHY Right 01/10/2021   Procedure: LOWER EXTREMITY ANGIOGRAPHY;  Surgeon: Algernon Huxley, MD;  Location: [facility];  Service: Cardiovascular;  Laterality: Right;   LOWER EXTREMITY ANGIOGRAPHY Right 01/24/2021   Procedure: LOWER EXTREMITY ANGIOGRAPHY;  Surgeon: Algernon Huxley, MD;  Location: [facility];  Service: Cardiovascular;  Laterality: Right;   LOWER EXTREMITY INTERVENTION N/A 11/19/2019   Procedure: LOWER EXTREMITY INTERVENTION;  Surgeon: Algernon Huxley, MD;  Location: [facility];  Service: Cardiovascular;  Laterality: N/A;   TEE WITHOUT CARDIOVERSION N/A 06/22/2017   Procedure: TRANSESOPHAGEAL ECHOCARDIOGRAM (TEE);  Surgeon: Wellington Hampshire, MD;  Location: [facility];  Service: Cardiovascular;  Laterality: N/A;   TOTAL KNEE ARTHROPLASTY Right 07/11/2018   Procedure: TOTAL KNEE ARTHROPLASTY;  Surgeon: Corky Mull, MD;  Location: [facility];  Service: Orthopedics;  Laterality: Right;   VAGINAL HYSTERECTOMY     VISCERAL ARTERY INTERVENTION N/A 06/20/2017   Procedure: Visceral Artery Intervention, possible aortic thrombectomy;  Surgeon: Algernon Huxley, MD;  Location: [facility];  Service: Cardiovascular;  Laterality: N/A;   VISCERAL ARTERY INTERVENTION N/A 01/28/2018   Procedure: VISCERAL ARTERY INTERVENTION;  Surgeon: Algernon Huxley, MD;  Location: [facility] [facility];  Service: Cardiovascular;  Laterality: N/A;    Family History   Problem Relation Age of Onset   Hypertension Mother    Heart disease Mother    Heart attack Mother    Breast cancer Mother    Hypertension Father    Breast cancer Paternal Aunt     Social History Social History   Tobacco Use   Smoking status: Some Days    Packs/day: 0.10    Types: Cigarettes   Smokeless tobacco: Never  Vaping Use   Vaping Use: Never used  Substance Use Topics   Alcohol use: No   Drug use: No    Allergies  Allergen Reactions   Gabapentin     Tremors    Bactrim [Sulfamethoxazole-Trimethoprim] Itching    Current Facility-Administered Medications  Medication Dose Route Frequency Provider Last Rate Last Admin   0.9 %  sodium chloride infusion   Intravenous Continuous Algernon Huxley, MD 20 mL/hr at 06/28/21 1505 New Bag at 06/28/21 1505   [MAR Hold] acetaminophen (TYLENOL) tablet 650 mg  650 mg Oral Q6H PRN Opyd, Ilene Qua, MD       Or   [facility] Sou Hold] acetaminophen (TYLENOL) suppository 650 mg  650 mg Rectal Q6H PRN Opyd,  Ilene Qua, MD       [MAR Hold] alum & mag hydroxide-simeth (MAALOX/MYLANTA) 200-200-20 MG/5ML suspension 15 mL  15 mL Oral Q4H PRN Mariel Aloe, MD       [MAR Hold] aspirin EC tablet 81 mg  81 mg Oral Daily Opyd, Ilene Qua, MD   81 mg at 06/27/21 0821   [MAR Hold] atorvastatin (LIPITOR) tablet 80 mg  80 mg Oral QHS Vianne Bulls, MD   80 mg at 06/27/21 2024   fentaNYL (SUBLIMAZE) 50 MCG/ML injection            heparin ADULT infusion 100 units/mL (25000 units/243m)  1,350 Units/hr Intravenous Continuous BRenda Rolls RPH 13.5 mL/hr at 06/28/21 1119 1,350 Units/hr at 06/28/21 1119   heparin sodium (porcine) 1000 UNIT/ML injection            [MAR Hold] HYDROmorphone (DILAUDID) injection 1-2 mg  1-2 mg Intravenous Q3H PRN PFritzi Mandes MD   2 mg at 06/28/21 1411   [MAR Hold] Ipratropium-Albuterol (COMBIVENT) respimat 1 puff  1 puff Inhalation Q6H PRN Opyd, TIlene Qua MD       [MAR Hold] isosorbide mononitrate (IMDUR) 24 hr tablet 60 mg  60 mg  Oral Daily KCorey Skains MD       [[facility] metoprolol tartrate (LOPRESSOR) tablet 50 mg  50 mg Oral BID Opyd, TIlene Qua MD   50 mg at 06/27/21 2024   midazolam (VERSED) 5 MG/5ML injection            [MAR Hold] nitroGLYCERIN (NITROSTAT) SL tablet 0.4 mg  0.4 mg Sublingual Q5 min PRN NMariel Aloe MD       [MAR Hold] ondansetron ([facility] injection 4 mg  4 mg Intravenous Q6H PRN Opyd, TIlene Qua MD       [MAR Hold] oxyCODONE-acetaminophen (PERCOCET/ROXICET) 5-325 MG per tablet 1-2 tablet  1-2 tablet Oral Q4H PRN NMariel Aloe MD   2 tablet at 06/27/21 2020   [MAR Hold] pantoprazole (PROTONIX) EC tablet 40 mg  40 mg Oral Daily Opyd, TIlene Qua MD   40 mg at 06/27/21 [id number]    Review of Systems Full ROS  was asked and was negative except for the information on the HPI  Physical Exam Blood pressure (!) 150/95, pulse (!) 102, temperature 97.7 F (36.5 C), temperature source Oral, resp. rate 15, height '5\' 2"'$  (1.575 m), weight 108.9 kg, SpO2 94 %. CONSTITUTIONAL: NAD, obese EYES: Pupils are equal, round,  Sclera are non-icteric. EARS, NOSE, MOUTH AND THROAT: The oropharynx is clear. The oral mucosa is pink and moist. Hearing is intact to voice. LYMPH NODES:  Lymph nodes in the neck are normal. RESPIRATORY:  Lungs are clear. There is normal respiratory effort, with equal breath sounds bilaterally, and without pathologic use of accessory muscles. CARDIOVASCULAR: Heart is regular without murmurs, gallops, or rubs. GI: The abdomen is soft,  and nondistended.  Chronically incarcerated ventral hernia w loss of domain, No peritonitis There are no palpable masses. There is no hepatosplenomegaly. There are normal bowel sounds  GU: Rectal deferred.   MUSCULOSKELETAL: cool feet bilaterally, mottling of the right toes. Non palpable palpable pedal pulses NEUROLOGIC: Motor and sensation is grossly normal. Cranial nerves are grossly intact. PSYCH:  Oriented to person, place and time. Affect is  normal.  Data Reviewed  I have personally reviewed the patient's imaging, laboratory findings and medical records.    Assessment/Plan 60yo female well-known to history incarcerated she was scheduled  to have abdominal reconstruction she came in over the weekend with critical limb ischemia right worse than the left.  She is in need for urgent revascularization.  Neurosurgery already had her on for angio and revascularization today.  Obviously critical limb ischemia trams the ventral hernia at this time.  She will need stable from a vascular perspective.  She may need thrombolysis or reconstruction at this time she does not have evidence of strangulation.  She may eventually require open Inflow procedure which may pose significant challenges. We will see her as outpt. No need for emergent surgical intervention  Time spent with the patient was 50 minutes, with more than 50% of the time spent in face-to-face education, counseling and care coordination.     Caroleen Hamman, MD FACS General Surgeon 06/28/2021, 5:05 PM

## 2021-06-28 NOTE — H&P (View-Only) (Signed)
Cardiology has seen patient and she is at higher risk but no invasive cardiac evaluation at this time.  Has ischemia to both LE, Right worse than left. Not a candidate for aortabifem with recent MI and huge ventral hernia with previous wound. Will try angiogram and likely thrombolytic therapy to restore flow, but amputation remains a likely outcome especially on the right.  Patient aware. To angio later today

## 2021-06-28 NOTE — Progress Notes (Addendum)
ANTICOAGULATION CONSULT NOTE   Pharmacy Consult for Heparin  Indication: DVT  Allergies  Allergen Reactions   Gabapentin     Tremors    Bactrim [Sulfamethoxazole-Trimethoprim] Itching    Patient Measurements: Height: '5\' 2"'$  (157.5 cm) Weight: 108.9 kg (240 lb) IBW/kg (Calculated) : 50.1 Heparin Dosing Weight:  76.5 kg   Vital Signs: Temp: 98 F (36.7 C) (09/06 0400) Temp Source: Oral (09/06 0400) BP: 129/53 (09/06 0400) Pulse Rate: 59 (09/06 0400)  Labs: Recent Labs    06/26/21 2248 06/26/21 2248 06/27/21 0236 06/27/21 0557 06/27/21 0856 06/27/21 1754 06/27/21 1948 06/27/21 2246 06/28/21 0426  HGB 13.1  --   --  12.5  --   --   --   --  12.5  HCT 39.7  --   --  38.3  --   --   --   --  37.0  PLT 200  --   --  207  --   --   --   --  183  APTT  --    < > 25  --  65* 81*  --  72* 82*  LABPROT  --   --  12.3  --   --   --   --   --   --   INR  --   --  0.9  --   --   --   --   --   --   HEPARINUNFRC  --   --  <0.10*  --  0.72*  --   --   --  0.72*  CREATININE 0.95  --   --  0.81  --   --   --   --   --   TROPONINIHS  --   --   --   --   --  893* 1,587* 1,748*  --    < > = values in this interval not displayed.     Estimated Creatinine Clearance: 84.7 mL/min (by C-G formula based on SCr of 0.81 mg/dL).   Medical History: Past Medical History:  Diagnosis Date   Abdominal aortic atherosclerosis ([place])    a. 05/2017 CTA abd/pelvis: significant atherosclerotic dzs of the infrarenal abd Ao w/ some mural thrombus. No aneurysm or dissection.   Acute focal ischemia of small intestine (HCC)    Acute right-sided low back pain with right-sided sciatica 06/13/2017   AKI (acute kidney injury) ([place]) 07/29/2017   Anginal pain ([place])    a. 08/2012 Lexiscan MV: EF 54%, non ischemia/infarct.   Anxiety and depression    Baker's cyst of knee, right    a. 07/2016 U/S: 4.1 x 1.4 x 2.9 cystic structure in R poplitetal fossa.   Bell's palsy    Borderline diabetes    CHF  (congestive heart failure) (HCC)    Chronic anticoagulation    apixaban   Chronic idiopathic constipation    Chronic mesenteric ischemia (HCC)    Chronic pain    COPD (chronic obstructive pulmonary disease) (HCC)    Diastolic dysfunction    a. 05/2017 Echo: Ef 60-65%, no rwma, Gr1 DD, no source of cardiac emboli.   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 05/2017 CTA Abd/pelvis: apparent thrombus or embolus in prox SMA (70-90%); b. 05/2017 catheter directed tPA, mechanical thrombectomy, and stenting of the SMA.   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[place]) 06/19/2017   Osteoarthritis    PAD (peripheral artery disease) (HCC)    PAF (paroxysmal atrial fibrillation) (HCC)    Palpitations    Peripheral edema    Personal history of other malignant neoplasm of skin 06/01/2016   Pneumonia    Pre-diabetes    Primary osteoarthritis of both knees 06/13/2017   SBO (small bowel obstruction) ([place]) 08/06/2017   Skin cancer of nose    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HCC)    Vitamin D deficiency     Medications:  Medications Prior to Admission  Medication Sig Dispense Refill Last Dose   albuterol (VENTOLIN HFA) 108 (90 Base) MCG/ACT inhaler Inhale 2 puffs into the lungs every 6 (six) hours as needed for wheezing or shortness of breath.      Alpha-Lipoic Acid 600 MG CAPS Take 1 capsule (600 mg total) by mouth daily. 30 capsule 2    ALPRAZolam (XANAX) 0.5 MG tablet Take 1 tablet (0.5 mg total) by mouth 2 (two) times daily as needed for anxiety. 8 tablet 0    amLODipine (NORVASC) 5 MG tablet Take 1 tablet (5 mg total) by  mouth daily. 30 tablet 2    apixaban (ELIQUIS) 5 MG TABS tablet Take 1 tablet (5 mg total) by mouth 2 (two) times daily. 60 tablet 11    aspirin EC 81 MG tablet Take 81 mg by mouth daily. Swallow whole.      atorvastatin (LIPITOR) 80 MG tablet Take 80 mg by mouth at bedtime.      COMBIVENT RESPIMAT 20-100 MCG/ACT AERS respimat Inhale 1 puff into the lungs daily as needed for wheezing or shortness of breath.      ferrous sulfate 325 (65 FE) MG tablet Take 325 mg by mouth daily.      metoprolol tartrate (LOPRESSOR) 50 MG tablet Take 1 tablet (50 mg total) by mouth 2 (two) times daily. 30 tablet 0    omeprazole (PRILOSEC) 20 MG capsule Take 20 mg by mouth 2 (two) times daily before a meal.      pregabalin (LYRICA) 200 MG capsule Take 1 capsule (200 mg total) by mouth 2 (two) times daily. (Patient taking differently: Take 100 mg by mouth 3 (three) times daily.) 90 capsule 0    varenicline (CHANTIX) 0.5 MG tablet Take 1 tablet (0.5 mg total) by mouth 2 (two) times daily. (Patient not taking: No sig reported) 60 tablet 2     Assessment: Pharmacy consulted to dose heparin in this 60 year old female with arterial blood clots in leg and abdomen.  Pt was on Eliquis 5 mg PO BID.  Per RN, last dose was on 9/1. CrCl = 72.3 ml/min  9/5 '@0856'$  aPTT=65  HL=0.72. not correlating but close.   Order Heparin bolus 1100 units and increase drip ti 1450 units.hr 9/5 1754 aPTT = 81s, therapeutic x 1 @ 1450 units/hr 9/5 2246 aPTT = 72s, therapeutic x 2 9/6 0426 HL = 0.72, slightly supratherapeutic    Goal of Therapy:  aPTT:  66 - 102 Heparin level 0.3-0.7 units/ml Monitor platelets by anticoagulation protocol: Yes   Plan:  Baseline HL 9/5 @ 0236 = <0.10, will begin to follow HL Decrease heparin infusion to 1350 units/hr  CBC daily while on heparin.  Renda Rolls, PharmD, [facility] 06/28/2021 5:10 AM

## 2021-06-28 NOTE — Progress Notes (Signed)
Cardiology has seen patient and she is at higher risk but no invasive cardiac evaluation at this time.  Has ischemia to both LE, Right worse than left. Not a candidate for aortabifem with recent MI and huge ventral hernia with previous wound. Will try angiogram and likely thrombolytic therapy to restore flow, but amputation remains a likely outcome especially on the right.  Patient aware. To angio later today

## 2021-06-28 NOTE — Consult Note (Signed)
[facility] Consultation Note  Patient ID: Carla [name], MRN: [id number], DOB/AGE: December 25, 1960 60 y.o. Admit date: 06/26/2021   Date of Consult: 06/28/2021 Primary Physician: Kirk Ruths, MD Primary Cardiologist: [facility]  Chief Complaint:  Chief Complaint  Patient presents with  . Leg Pain   Reason for Consult:  Chest pain with known coronary disease  HPI: 60 y.o. female with known coronary artery disease status post recent cardiac catheterization showing 60% right coronary artery stenosis of 50% diagonal stenosis and diffuse circumflex and LAD atherosclerosis noncritical.  The patient has had known severe peripheral vascular disease for which the patient now has significant concerns for lower extremity ischemia.  The patient has been placed on appropriate medication management in the past with high intensity cholesterol therapy and antiplatelet therapy as well as beta-blocker.  The patient did have an echocardiogram showing normal LV systolic function with ejection fraction of 60% and no evidence of significant valvular heart disease.  When the patient 1 is admitted the patient did have a little bit of chest discomfort off and on over the last 24 to 48 hours with a troponin elevation peak of 1748 possibly consistent with non-ST ovation myocardial infarction and/or demand ischemia.  EKG has been unchanged and normal sinus rhythm since admission.  She has not had any chest pain over the last 24 hours.  The patient has had this ischemic lower extremity for which she will need further intervention and can be medically managed through this issue.  This will include aggressive medical management  Past Medical History:  Diagnosis Date  . Abdominal aortic atherosclerosis ([place])    a. 05/2017 CTA abd/pelvis: significant atherosclerotic dzs of the infrarenal abd Ao w/ some mural thrombus. No aneurysm or dissection.  . Acute focal ischemia of small intestine ([place])   . Acute right-sided  low back pain with right-sided sciatica 06/13/2017  . AKI (acute kidney injury) ([place]) 07/29/2017  . Anginal pain ([place])    a. 08/2012 Lexiscan MV: EF 54%, non ischemia/infarct.  . Anxiety and depression   . Baker's cyst of knee, right    a. 07/2016 U/S: 4.1 x 1.4 x 2.9 cystic structure in R poplitetal fossa.  . Bell's palsy   . Borderline diabetes   . CHF (congestive heart failure) ([place])   . Chronic anticoagulation    apixaban  . Chronic idiopathic constipation   . Chronic mesenteric ischemia ([place])   . Chronic pain   . COPD (chronic obstructive pulmonary disease) ([place])   . Diastolic dysfunction    a. 05/2017 Echo: Ef 60-65%, no rwma, Gr1 DD, no source of cardiac emboli.  [name] Dyspnea   . 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[place])    a. 05/2017 CTA Abd/pelvis: apparent thrombus or embolus in prox SMA (70-90%); b. 05/2017 catheter directed tPA, mechanical thrombectomy, and stenting of the SMA.  . Essential hypertension with goal blood pressure less than 140/90 01/19/2016  . Gastroesophageal reflux disease 01/19/2016  . GERD (gastroesophageal reflux disease)   . H/O colectomy   . History of 2019 novel coronavirus disease (COVID-19) 11/14/2019  . History of kidney stones   . Hyperlipidemia   . Hypertension   . Morbid obesity with BMI of 40.0-44.9, adult ([place])   . Occlusive mesenteric ischemia ([place]) 06/19/2017  . Osteoarthritis   . PAD (peripheral artery disease) ([place])   . PAF (paroxysmal atrial fibrillation) ([place])   . Palpitations   . Peripheral edema   . Personal history of other malignant neoplasm  of skin 06/01/2016  . Pneumonia   . Pre-diabetes   . Primary osteoarthritis of both knees 06/13/2017  . SBO (small bowel obstruction) ([place]) 08/06/2017  . Skin cancer of nose   . 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 Vitamin D deficiency       Surgical History:  Past Surgical History:  Procedure Laterality Date  . AMPUTATION TOE Left 12/09/2019   Procedure: AMPUTATION TOE  MPJ left;  Surgeon: Caroline More, DPM;  Location: [facility];  Service: Podiatry;  Laterality: Left;  . APPENDECTOMY    . CHOLECYSTECTOMY    . COLON SURGERY    . EMBOLECTOMY OR THROMBECTOMY, WITH OR WITHOUT CATHETER; FEMOROPOPLITEAL, AORTOILIAC ARTERY, BY LEG INCISION N/A 06/21/2020   Left - Decomp Fasciotomy Leg; Ant &/Or Lat Compart Only; Midline - Angiography, Visceral, Selective Or Supraselective (With Or Without Flush Aortogram); Left - Revascularize, Endovasc, Open/Percut, Iliac Artery, Unilat, Initial Vessel; W/Translum Stent, W/Angioplasty; Right - Revascularize, Endovasc, Open/Percut, Iliac Artery, Ea Add`L Ipsilateral; W/Translumin Stent, W/Angioplasty; Location [facility];  . LAPAROTOMY N/A 08/08/2017   Procedure: EXPLORATORY LAPAROTOMY POSSIBLE BOWEL RESECTION;  Surgeon: Jules Husbands, MD;  Location: [facility];  Service: General;  Laterality: N/A;  . LEFT HEART CATH AND CORONARY ANGIOGRAPHY N/A 05/17/2021   Procedure: LEFT HEART CATH AND CORONARY ANGIOGRAPHY;  Surgeon: Yolonda Kida, MD;  Location: [place] [facility];  Service: Cardiovascular;  Laterality: N/A;  . LOWER EXTREMITY ANGIOGRAPHY Left 11/17/2019   Procedure: LOWER EXTREMITY ANGIOGRAPHY;  Surgeon: Algernon Huxley, MD;  Location: [place] [facility];  Service: Cardiovascular;  Laterality: Left;  . LOWER EXTREMITY ANGIOGRAPHY Right 12/13/2020   Procedure: LOWER EXTREMITY ANGIOGRAPHY;  Surgeon: Algernon Huxley, MD;  Location: [place] [facility];  Service: Cardiovascular;  Laterality: Right;  . LOWER EXTREMITY ANGIOGRAPHY Right 01/10/2021   Procedure: LOWER EXTREMITY ANGIOGRAPHY;  Surgeon: Algernon Huxley, MD;  Location: [place] [facility];  Service: Cardiovascular;  Laterality: Right;  . LOWER EXTREMITY ANGIOGRAPHY Right 01/24/2021   Procedure: LOWER EXTREMITY ANGIOGRAPHY;  Surgeon: Algernon Huxley, MD;  Location: [place] [facility];  Service: Cardiovascular;  Laterality: Right;  . LOWER EXTREMITY INTERVENTION N/A 11/19/2019    Procedure: LOWER EXTREMITY INTERVENTION;  Surgeon: Algernon Huxley, MD;  Location: [place] [facility];  Service: Cardiovascular;  Laterality: N/A;  . TEE WITHOUT CARDIOVERSION N/A 06/22/2017   Procedure: TRANSESOPHAGEAL ECHOCARDIOGRAM (TEE);  Surgeon: Wellington Hampshire, MD;  Location: [facility];  Service: Cardiovascular;  Laterality: N/A;  . TOTAL KNEE ARTHROPLASTY Right 07/11/2018   Procedure: TOTAL KNEE ARTHROPLASTY;  Surgeon: Corky Mull, MD;  Location: [facility];  Service: Orthopedics;  Laterality: Right;  [name] VAGINAL HYSTERECTOMY    . VISCERAL ARTERY INTERVENTION N/A 06/20/2017   Procedure: Visceral Artery Intervention, possible aortic thrombectomy;  Surgeon: Algernon Huxley, MD;  Location: [place] [facility];  Service: Cardiovascular;  Laterality: N/A;  . VISCERAL ARTERY INTERVENTION N/A 01/28/2018   Procedure: VISCERAL ARTERY INTERVENTION;  Surgeon: Algernon Huxley, MD;  Location: [place] [facility];  Service: Cardiovascular;  Laterality: N/A;     Home Meds: Prior to Admission medications   Medication Sig Start Date End Date Taking? Authorizing Provider  albuterol (VENTOLIN HFA) 108 (90 Base) MCG/ACT inhaler Inhale 2 puffs into the lungs every 6 (six) hours as needed for wheezing or shortness of breath. 09/03/19   [provider]  Alpha-Lipoic Acid 600 MG CAPS Take 1 capsule (600 mg total) by mouth daily. 06/16/21 07/16/21  Gillis Santa, MD  ALPRAZolam Duanne Moron) 0.5 MG tablet Take 1 tablet (0.5 mg total) by mouth 2 (two) times daily as needed for anxiety. 06/22/21   Pabon, Diego F, MD  amLODipine (NORVASC) 5 MG tablet Take 1 tablet (5 mg total) by mouth daily. 04/10/18   Gladstone Lighter, MD  apixaban (ELIQUIS) 5 MG TABS tablet Take 1 tablet (5 mg total) by mouth 2 (two) times daily. 01/25/21   Stegmayer, Janalyn Harder, PA-C  aspirin EC 81 MG tablet Take 81 mg by mouth daily. Swallow whole.    [provider]  atorvastatin (LIPITOR) 80 MG tablet Take 80 mg by mouth at bedtime.     [provider]  COMBIVENT RESPIMAT 20-100 MCG/ACT AERS respimat Inhale 1 puff into the lungs daily as needed for wheezing or shortness of breath. 04/06/21   [provider]  ferrous sulfate 325 (65 FE) MG tablet Take 325 mg by mouth daily.    [provider]  metoprolol tartrate (LOPRESSOR) 50 MG tablet Take 1 tablet (50 mg total) by mouth 2 (two) times daily. 06/15/18   Bettey Costa, MD  omeprazole (PRILOSEC) 20 MG capsule Take 20 mg by mouth 2 (two) times daily before a meal.    [provider]  pregabalin (LYRICA) 200 MG capsule Take 1 capsule (200 mg total) by mouth 2 (two) times daily. Patient taking differently: Take 100 mg by mouth 3 (three) times daily. 02/16/21   Pabon, Diego F, MD  varenicline (CHANTIX) 0.5 MG tablet Take 1 tablet (0.5 mg total) by mouth 2 (two) times daily. Patient not taking: No sig reported 04/18/21   Jules Husbands, MD    Inpatient Medications:  . aspirin EC  81 mg Oral Daily  . atorvastatin  80 mg Oral QHS  . isosorbide mononitrate  60 mg Oral Daily  . metoprolol tartrate  50 mg Oral BID  . pantoprazole  40 mg Oral Daily   . heparin 1,350 Units/hr (06/28/21 0527)    Allergies:  Allergies  Allergen Reactions  . Gabapentin     Tremors   . Bactrim [Sulfamethoxazole-Trimethoprim] Itching    Social History   Socioeconomic History  . Marital status: Significant Other    Spouse name: [name]   . Number of children: Not on file  . Years of education: Not on file  . Highest education level: Not on file  Occupational History  . Occupation: Unemployed  Tobacco Use  . Smoking status: Some Days    Packs/day: 0.10    Types: Cigarettes  . Smokeless tobacco: Never  Vaping Use  . Vaping Use: Never used  Substance and Sexual Activity  . Alcohol use: No  . Drug use: No  . Sexual activity: Not on file  Other Topics Concern  . Not on file  Social History Narrative   Lives in [place] with boyfriend.  Does not routinely  exercise.   Social Determinants of Health   Financial Resource Strain: Not on file  Food Insecurity: Not on file  Transportation Needs: Not on file  Physical Activity: Not on file  Stress: Not on file  Social Connections: Not on file  Intimate Partner Violence: Not on file     Family History  Problem Relation Age of Onset  . Hypertension Mother   . Heart disease Mother   . Heart attack Mother   . Breast cancer Mother   . Hypertension Father   . Breast cancer Paternal Aunt      Review  of Systems Positive for limb pain Negative for: General:  chills, fever, night sweats or weight changes.  Cardiovascular: PND orthopnea syncope dizziness  Dermatological skin lesions rashes Respiratory: Cough congestion Urologic: Frequent urination urination at night and hematuria Abdominal: negative for nausea, vomiting, diarrhea, bright red blood per rectum, melena, or hematemesis Neurologic: negative for visual changes, and/or hearing changes  All other systems reviewed and are otherwise negative except as noted above.  Labs: No results for input(s): CKTOTAL, CKMB, TROPONINI in the last 72 hours. Lab Results  Component Value Date   WBC 8.3 06/28/2021   HGB 12.5 06/28/2021   HCT 37.0 06/28/2021   MCV 90.7 06/28/2021   PLT 183 06/28/2021    Recent Labs  Lab 06/28/21 0426  NA 138  K 4.1  CL 106  CO2 26  BUN 18  CREATININE 0.63  CALCIUM 8.6*  GLUCOSE 118*   Lab Results  Component Value Date   CHOL 304 (H) 07/22/2012   HDL 51 07/22/2012   LDLCALC 191 (H) 07/22/2012   TRIG 167 (H) 08/13/2017   No results found for: DDIMER  Radiology/Studies:  CT ANGIO AO+BIFEM W & OR WO CONTRAST  Result Date: 06/27/2021 CLINICAL DATA:  Right leg pain, cold right foot EXAM: CT ANGIOGRAPHY AOBIFEM WITHOUT AND WITH CONTRAST TECHNIQUE: 100 cc Omnipaque 350 CONTRAST:  114m OMNIPAQUE IOHEXOL 350 MG/ML SOLN COMPARISON:  None. FINDINGS: Abdomen pelvis: The abdominal aorta is of normal caliber. No  aneurysm or dissection. Mild atherosclerotic calcification. Bilateral iliac artery stenting has been performed with a proximal landing zone of the kissing stents within the distal aorta. At this level, the aorta is occluded, as are the common iliac artery stents. No periaortic inflammatory stranding. The celiac axis demonstrates normal anatomic configuration and is widely patent. Stenting of the proximal superior mesenteric artery has been performed with thrombosis of the stent and reconstitution of the mid superior mesenteric artery via the inferior pancreaticoduodenal arcade and arc of [place]. The inferior mesenteric artery is patent and hypertrophied, though demonstrates a 50% stenosis at its origin. Single right and dual left renal arteries are widely patent and demonstrate normal vascular morphology. No aneurysm. Right lower extremity: Right common and external iliac artery stenting has been performed. The right common iliac artery is thrombosed with reconstitution of the external iliac artery via the internal iliac artery. Right lower extremity arterial outflow is widely patent. Limited evaluation of the popliteal artery secondary to streak artifact from right total knee arthroplasty. Occlusion of the anterior tibial artery proximally. Occlusion of the a posterior tibial artery just distal to the tibioperoneal trunk. Occlusion of the peroneal artery at the level of the a mid diaphysis of the tibia. No definite distal reconstitution. Left lower extremity: Left common iliac artery and external iliac artery stenting has been performed. Common iliac artery is occluded. Reconstitution of the external iliac artery via the internal iliac artery. Left lower extremity arterial outflow is widely patent. Left popliteal artery and tibioperoneal trunk are widely patent. Anterior tibial artery is occluded at its origin. Posterior tibial artery occludes at the level of the tibial plafond but is subsequently reconstituted at  the level of the plantar arch. Peroneal artery continues to supply the lateral calcaneal artery. Dorsalis pedis artery is occluded. Visualized lung bases are clear. Visualized heart and pericardium are unremarkable. Status post cholecystectomy. Moderate intra and extrahepatic biliary ductal dilation is stable from multiple prior examinations and likely represents post cholecystectomy change. Liver otherwise unremarkable. Pancreas, spleen, right adrenal gland, and  kidneys are unremarkable. Nodular thickening of the left adrenal gland is unchanged. Large broad-based ventral hernia containing multiple loops of large and small bowel is unchanged. Surgical anastomotic staple line noted within the distal small bowel. The bowel is otherwise unremarkable. No free intraperitoneal gas or fluid. Uterus absent. No adnexal masses. Bladder unremarkable. Rectum unremarkable. No pathologic adenopathy within the abdomen and pelvis. Review of the MIP images confirms the above findings. 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 50% stenosis of the IMA origin. Thrombosis of bilateral common iliac artery kissing stents with resultant thrombosis of the terminal abdominal aorta below the level of the IMA origin. Reconstitution of the external iliac arteries bilaterally via internal iliac artery collaterals. Bilateral lower extremity runoff disease. Occlusion of all 3 runoff vessels of the right lower extremity without identifiable reconstitution distal to the mid diaphysis of the tibia. Two vessel runoff to the left foot. Aortic Atherosclerosis (ICD10-I70.0). Electronically Signed   By: Fidela Salisbury M.D.   On: 06/27/2021 01:21    EKG: Normal sinus rhythm otherwise normal EKG  Weights: Filed Weights   06/26/21 2241  Weight: 108.9 kg     Physical Exam: Blood pressure 125/61, pulse (!) 59,  temperature 98.5 F (36.9 C), resp. rate 17, height '5\' 2"'$  (1.575 m), weight 108.9 kg, SpO2 96 %. Body mass index is 43.9 kg/m. General: Well developed, well nourished, in no acute distress. Head eyes ears nose throat: Normocephalic, atraumatic, sclera non-icteric, no xanthomas, nares are without discharge. No apparent thyromegaly and/or mass  Lungs: Normal respiratory effort.  no wheezes, no rales, no rhonchi.  Heart: RRR with normal S1 S2. no murmur gallop, no rub, PMI is normal size and placement, carotid upstroke normal without bruit, jugular venous pressure is normal Abdomen: Soft, non-tender, non-distended with normoactive bowel sounds. No hepatomegaly. No rebound/guarding. No obvious abdominal masses. Abdominal aorta is normal size without bruit Extremities: Trace edema.  Positive cyanosis, no clubbing, no ulcers  Peripheral : 2+ bilateral upper extremity pulses, 2+ bilateral femoral pulses, 0+ bilateral dorsal pedal pulse Neuro: Alert and oriented. No facial asymmetry. No focal deficit. Moves all extremities spontaneously. Musculoskeletal: Normal muscle tone without kyphosis Psych:  Responds to questions appropriately with a normal affect.    Assessment: 60 year old female with significant peripheral vascular disease and ischemic changes having known coronary artery disease with normal LV function and no previous heart attack having some chest pain but elevated troponin consistent with significant demand ischemia and/or non-ST elevation myocardial infarction with normal EKG currently stable.  We have suggested that the patient has ischemic limb and will need further intervention and can be medically managed through this although there is some increased risk has been discussed with the patient  Plan: 1.  Continue metoprolol for further risk reduction and reduction in demand ischemia 2.  Nitrate isosorbide for further treatment of chest discomfort and improvements of potential for ischemia  and/or Dant demand ischemia 3.  Continue heparin and treatment for risk reduction in peripheral vascular disease ischemia as well as for demand ischemia throughout procedure pre and post although he may discontinue during procedure to reduce bleeding complications 4.  No further cardiac diagnostics necessary at this time 5.  Diligent heart rate and blood pressure control throughout procedure with medication management listed above including beta-blocker and/or possible addition of calcium channel blocker as well  Signed, Corey Skains M.D. [facility] 06/28/2021, 8:28 AM

## 2021-06-29 ENCOUNTER — Encounter: Payer: Self-pay | Admitting: Vascular Surgery

## 2021-06-29 ENCOUNTER — Encounter
Admission: EM | Disposition: A | Discharge status: Home / Self Care | Payer: Self-pay | Source: Home / Self Care | Attending: Internal Medicine

## 2021-06-29 DIAGNOSIS — I70223 Atherosclerosis of native arteries of extremities with rest pain, bilateral legs: Secondary | ICD-10-CM

## 2021-06-29 HISTORY — PX: LOWER EXTREMITY INTERVENTION: CATH118252

## 2021-06-29 LAB — BASIC METABOLIC PANEL
Anion gap: 9 (ref 5–15)
BUN: 13 mg/dL (ref 8–23)
CO2: 27 mmol/L (ref 22–32)
Calcium: 8.6 mg/dL — ABNORMAL LOW (ref 8.9–10.3)
Chloride: 101 mmol/L (ref 98–111)
Creatinine, Ser: 0.69 mg/dL (ref 0.44–1.00)
GFR, Estimated: >60 mL/min (ref >60)
Glucose, Bld: 112 mg/dL — ABNORMAL HIGH (ref 70–99)
Potassium: 4.3 mmol/L (ref 3.5–5.1)
Sodium: 137 mmol/L (ref 135–145)

## 2021-06-29 LAB — CBC
HCT: 38.7 % (ref 36.0–46.0)
Hemoglobin: 13 g/dL (ref 12.0–15.0)
MCH: 30.4 pg (ref 26.0–34.0)
MCHC: 33.6 g/dL (ref 30.0–36.0)
MCV: 90.6 fL (ref 80.0–100.0)
Platelets: 160 10*3/uL (ref 150–400)
RBC: 4.27 MIL/uL (ref 3.87–5.11)
RDW: 15.9 % — ABNORMAL HIGH (ref 11.5–15.5)
WBC: 8.4 10*3/uL (ref 4.0–10.5)
nRBC: 0 % (ref 0.0–0.2)

## 2021-06-29 LAB — APTT: aPTT: 39 seconds — ABNORMAL HIGH (ref 24–36)

## 2021-06-29 LAB — FIBRINOGEN: Fibrinogen: 332 mg/dL (ref 210–475)

## 2021-06-29 SURGERY — LOWER EXTREMITY INTERVENTION
Anesthesia: Moderate Sedation | Laterality: Bilateral

## 2021-06-29 MED ORDER — FENTANYL CITRATE (PF) 100 MCG/2ML IJ SOLN
INTRAMUSCULAR | Status: DC | PRN
  Administered 2021-06-29 (×2): 25 ug via INTRAVENOUS
  Administered 2021-06-29: 50 ug via INTRAVENOUS

## 2021-06-29 MED ORDER — MIDAZOLAM HCL 2 MG/2ML IJ SOLN
INTRAMUSCULAR | Status: DC | PRN
  Administered 2021-06-29: 2 mg via INTRAVENOUS
  Administered 2021-06-29 (×2): 1 mg via INTRAVENOUS

## 2021-06-29 MED ORDER — MIDAZOLAM HCL 2 MG/2ML IJ SOLN
INTRAMUSCULAR | Status: AC
  Filled 2021-06-29: qty 4

## 2021-06-29 MED ORDER — HEPARIN BOLUS VIA INFUSION
2300.0000 [IU] | Freq: Once | INTRAVENOUS | Status: AC
  Administered 2021-06-30: 2300 [IU] via INTRAVENOUS
  Filled 2021-06-29: qty 2300

## 2021-06-29 MED ORDER — HEPARIN SODIUM (PORCINE) 1000 UNIT/ML IJ SOLN
INTRAMUSCULAR | Status: AC
  Filled 2021-06-29: qty 1

## 2021-06-29 MED ORDER — HEPARIN (PORCINE) 25000 UT/250ML-% IV SOLN
1300.0000 [IU]/h | INTRAVENOUS | Status: AC
  Administered 2021-06-29: 900 [IU]/h via INTRAVENOUS
  Administered 2021-06-30: 1150 [IU]/h via INTRAVENOUS
  Filled 2021-06-29 (×2): qty 250

## 2021-06-29 MED ORDER — HEPARIN SODIUM (PORCINE) 1000 UNIT/ML IJ SOLN
INTRAMUSCULAR | Status: DC | PRN
  Administered 2021-06-29: 3000 [IU] via INTRAVENOUS

## 2021-06-29 MED ORDER — FENTANYL CITRATE (PF) 100 MCG/2ML IJ SOLN
INTRAMUSCULAR | Status: AC
  Filled 2021-06-29: qty 2

## 2021-06-29 SURGICAL SUPPLY — 14 items
BALLN ULTRVRSE 7X100X75 (BALLOONS) ×2
BALLN ULTRVRSE 7X80X75 (BALLOONS) ×2
BALLOON ULTRVRSE 7X100X75 (BALLOONS) ×1 IMPLANT
BALLOON ULTRVRSE 7X80X75 (BALLOONS) ×1 IMPLANT
CANISTER PENUMBRA ENGINE (MISCELLANEOUS) ×2 IMPLANT
CATH ANGIO 5F PIGTAIL 65CM (CATHETERS) ×2 IMPLANT
CATH INDIGO 7D KIT (CATHETERS) ×2 IMPLANT
DEVICE SAFEGUARD 24CM (GAUZE/BANDAGES/DRESSINGS) ×4 IMPLANT
DEVICE STARCLOSE SE CLOSURE (Vascular Products) ×4 IMPLANT
KIT ENCORE 26 ADVANTAGE (KITS) ×4 IMPLANT
PACK ANGIOGRAPHY (CUSTOM PROCEDURE TRAY) ×2 IMPLANT
SHEATH BRITE TIP 7FRX11 (SHEATH) ×2 IMPLANT
WIRE G 018X200 V18 (WIRE) ×2 IMPLANT
WIRE MAGIC TOR.035 180C (WIRE) ×4 IMPLANT

## 2021-06-29 NOTE — Progress Notes (Signed)
[facility] [place] NAME: Carla [name]    MR#:  [id number]  DATE OF BIRTH:  [birth year]  SUBJECTIVE:   patient transferred to ICU after right lower extremity intervention by vascular surgery this morning. She is on fentanyl PCA pump REVIEW OF SYSTEMS:   Review of Systems  Constitutional:  Negative for chills, fever and weight loss.  HENT:  Negative for ear discharge, ear pain and nosebleeds.   Eyes:  Negative for blurred vision, pain and discharge.  Respiratory:  Negative for sputum production, shortness of breath, wheezing and stridor.   Cardiovascular:  Negative for chest pain, palpitations, orthopnea and PND.  Gastrointestinal:  Negative for abdominal pain, diarrhea, nausea and vomiting.  Genitourinary:  Negative for frequency and urgency.  Musculoskeletal:  Negative for back pain and joint pain.       Right leg constant pain  Neurological:  Negative for sensory change, speech change, focal weakness and weakness.  Psychiatric/Behavioral:  Negative for depression and hallucinations. The patient is not nervous/anxious.   Tolerating Diet:yes Tolerating PT:   DRUG ALLERGIES:   Allergies  Allergen Reactions  . Gabapentin     Tremors   . Bactrim [Sulfamethoxazole-Trimethoprim] Itching    VITALS:  Blood pressure (!) 146/72, pulse 89, temperature 98.8 F (37.1 C), temperature source Oral, resp. rate 16, height '5\' 2"'$  (1.575 m), weight 104.2 kg, SpO2 96 %.  PHYSICAL EXAMINATION:   Physical Exam  GENERAL:  60 y.o.-year-old patient lying in the bed with no acute distress.  LUNGS: Normal breath sounds bilaterally, no wheezing, rales, rhonchi. No use of accessory muscles of respiration.  CARDIOVASCULAR: S1, S2 normal. No murmurs, rubs, or gallops.  ABDOMEN: Soft, nontender, large ventral hernia + chronic  Bowel sounds present. No organomegaly or mass.  EXTREMITIES: dusky hue right LE NEUROLOGIC: non focal PSYCHIATRIC:  patient is alert  and oriented x 3.  SKIN: No obvious rash, lesion, or ulcer.   LABORATORY PANEL:  CBC Recent Labs  Lab 06/29/21 0548  WBC 8.4  HGB 13.0  HCT 38.7  PLT 160     Chemistries  Recent Labs  Lab 06/29/21 0548  NA 137  K 4.3  CL 101  CO2 27  GLUCOSE 112*  BUN 13  CREATININE 0.69  CALCIUM 8.6*    Cardiac Enzymes No results for input(s): TROPONINI in the last 168 hours. RADIOLOGY:  PERIPHERAL VASCULAR CATHETERIZATION  Result Date: 06/29/2021 See surgical note for result.  PERIPHERAL VASCULAR CATHETERIZATION  Result Date: 06/28/2021 See surgical note for result.  ASSESSMENT AND PLAN:   Carla [name] is a 60 y.o. female with medical history significant for peripheral arterial disease status post multiple stents and thrombectomies, SMA thrombosis status post remote intervention, CAD, PAF on Eliquis, COPD, and recurrent ventral hernia scheduled for repair on 06/28/2021, now presenting to the emergency department for evaluation of severe right leg pain.  The patient reports that she has been holding Eliquis for the upcoming surgery and then developed pain in the right leg yesterday morning that has become increasingly severe.     CTA LE notable for thrombosis of stented proximal SMA with reconstitution, thrombosis of bilateral iliac artery stents with resultant thrombosis of abdominal aorta and bilateral lower extremity runoff disease with occlusion of all 3 RLE runoffs.  Bilateral Lower extremity arterial thrombosis; Right > left with severe PAD   - Pt with PAD, SMA thrombosis, and hx of multiple vascular interventions presents with severe right leg pain  after stopping Eliquis for ventral hernia repair 9/6 and is found to have cool and mottled right toes concerning for acute on chronic ischemia  - Vascular surgery consult with Dr dew - Continue IV heparin and pain-control --9/6-- patient to undergo lower extremity angiogram with TPA for severely occluded vessels and stents --9/7--pt  underwent intervention again by vascular sx today--details awaited --on Fentanyl PCA pump    CAD  - Recent LHC with 60% stenosis involving proximal RCA and 50% 1st diagonal  - No anginal complaints today - Continue medical therapy  -- patient seen by Dr. Nehemiah Massed. No cardiac evaluation recommended at present    COPD  - No cough or wheezing on admission; she reports no cigarettes in 2-3 weeks  - Continue Combivent, smoking cessation     chronic ventral hernia  - Was scheduled for repair on 06/28/21 -- canceled. Patient follows with Dr. Dahlia Byes - No acute pain or peritoneal signs    PAF  - In sinus rhythm on admission  - Eliquis has been on hold for upcoming surgery, currently on IV heparin, continue metoprolol     HL -- continue statins    Procedures: LE angiogram 9/6 Family communication : none Consults : vascular, cardiology CODE STATUS: full DVT Prophylaxis : heparin drip Level of care: ICU Status is: Inpatient  Remains inpatient appropriate because:Inpatient level of care appropriate due to severity of illness  Dispo: The patient is from: Home              Anticipated d/c is to: Home              Patient currently is not medically stable to d/c.   Difficult to place patient No        TOTAL TIME TAKING CARE OF THIS PATIENT: 35 minutes.  >50% time spent on counselling and coordination of care  Note: This dictation was prepared with Dragon dictation along with smaller phrase technology. Any transcriptional errors that result from this process are unintentional.  Fritzi Mandes M.D    [facility]   CC: Primary care physician; Kirk Ruths, MD Patient ID: Carla [name], female   DOB: 09/23/1961, 60 y.o.   MRN: [id number]

## 2021-06-29 NOTE — Plan of Care (Signed)

## 2021-06-29 NOTE — Progress Notes (Signed)
[facility] Encounter Note  Patient: Carla [name] / Admit Date: 06/26/2021 / Date of Encounter: 06/29/2021, 2:08 PM   Subjective: Patient is no no evidence of chest pain overnight with good blood pressure and heart rate control and appropriate medication management for further risk reduction of angina and/or cardiovascular event with potential intervention vascular standpoint.  Patient did not have improvements of peripheral vascular flow after tPA.  Intervention attempting to improve flow with vascular surgery  Review of Systems: Positive for: Limb pain Negative for: Vision change, hearing change, syncope, dizziness, nausea, vomiting,diarrhea, bloody stool, stomach pain, cough, congestion, diaphoresis, urinary frequency, urinary pain,skin lesions, skin rashes Others previously listed  Objective: Telemetry: Sinus rhythm Physical Exam: Blood pressure (!) 146/72, pulse 89, temperature 98.8 F (37.1 C), temperature source Oral, resp. rate 16, height '5\' 2"'$  (1.575 m), weight 104.2 kg, SpO2 96 %. Body mass index is 42.02 kg/m. General: Well developed, well nourished, in no acute distress. Head: Normocephalic, atraumatic, sclera non-icteric, no xanthomas, nares are without discharge. Neck: No apparent masses Lungs: Normal respirations with no wheezes, no rhonchi, no rales , no crackles   Heart: Regular rate and rhythm, normal S1 S2, no murmur, no rub, no gallop, PMI is normal size and placement, carotid upstroke normal without bruit, jugular venous pressure normal Abdomen: Soft, non-tender, non-distended with normoactive bowel sounds. No hepatosplenomegaly. Abdominal aorta is normal size without bruit Extremities: Base edema, no clubbing, no cyanosis, no ulcers,  Peripheral: 2+ radial, 0+ femoral, 0+ dorsal pedal pulses Neuro: Alert and oriented. Moves all extremities spontaneously. Psych:  Responds to questions appropriately with a normal affect.   Intake/Output Summary  (Last 24 hours) at 06/29/2021 1408 Last data filed at 06/29/2021 1053 Gross per 24 hour  Intake 288.5 ml  Output 2325 ml  Net -2036.5 ml    Inpatient Medications:  . aspirin EC  81 mg Oral Daily  . atorvastatin  80 mg Oral QHS  . Chlorhexidine Gluconate Cloth  6 each Topical Q0600  . fentaNYL      . fentaNYL   Intravenous Q4H  . heparin sodium (porcine)      . isosorbide mononitrate  60 mg Oral Daily  . metoprolol tartrate  50 mg Oral BID  . midazolam      . pantoprazole  40 mg Oral Daily  . sodium chloride flush  3 mL Intravenous Q12H   Infusions:  . sodium chloride    . heparin 900 Units/hr (06/29/21 1402)    Labs: Recent Labs    06/28/21 0426 06/29/21 0548  NA 138 137  K 4.1 4.3  CL 106 101  CO2 26 27  GLUCOSE 118* 112*  BUN 18 13  CREATININE 0.63 0.69  CALCIUM 8.6* 8.6*   No results for input(s): AST, ALT, ALKPHOS, BILITOT, PROT, ALBUMIN in the last 72 hours. Recent Labs    06/26/21 2248 06/27/21 0557 06/28/21 2257 06/29/21 0548  WBC 11.0*   < > 8.8 8.4  NEUTROABS 7.7  --   --   --   HGB 13.1   < > 12.8 13.0  HCT 39.7   < > 38.9 38.7  MCV 91.9   < > 91.1 90.6  PLT 200   < > 179 160   < > = values in this interval not displayed.   No results for input(s): CKTOTAL, CKMB, TROPONINI in the last 72 hours. Invalid input(s): POCBNP No results for input(s): HGBA1C in the last 72 hours.   Weights: [employer]  Weights   06/26/21 2241 06/28/21 1830  Weight: 108.9 kg 104.2 kg     Radiology/Studies:  CT ANGIO AO+BIFEM W & OR WO CONTRAST  Result Date: 06/27/2021 CLINICAL DATA:  Right leg pain, cold right foot EXAM: CT ANGIOGRAPHY AOBIFEM WITHOUT AND WITH CONTRAST TECHNIQUE: 100 cc Omnipaque 350 CONTRAST:  134m OMNIPAQUE IOHEXOL 350 MG/ML SOLN COMPARISON:  None. FINDINGS: Abdomen pelvis: The abdominal aorta is of normal caliber. No aneurysm or dissection. Mild atherosclerotic calcification. Bilateral iliac artery stenting has been performed with a proximal landing zone  of the kissing stents within the distal aorta. At this level, the aorta is occluded, as are the common iliac artery stents. No periaortic inflammatory stranding. The celiac axis demonstrates normal anatomic configuration and is widely patent. Stenting of the proximal superior mesenteric artery has been performed with thrombosis of the stent and reconstitution of the mid superior mesenteric artery via the inferior pancreaticoduodenal arcade and arc of [place]. The inferior mesenteric artery is patent and hypertrophied, though demonstrates a 50% stenosis at its origin. Single right and dual left renal arteries are widely patent and demonstrate normal vascular morphology. No aneurysm. Right lower extremity: Right common and external iliac artery stenting has been performed. The right common iliac artery is thrombosed with reconstitution of the external iliac artery via the internal iliac artery. Right lower extremity arterial outflow is widely patent. Limited evaluation of the popliteal artery secondary to streak artifact from right total knee arthroplasty. Occlusion of the anterior tibial artery proximally. Occlusion of the a posterior tibial artery just distal to the tibioperoneal trunk. Occlusion of the peroneal artery at the level of the a mid diaphysis of the tibia. No definite distal reconstitution. Left lower extremity: Left common iliac artery and external iliac artery stenting has been performed. Common iliac artery is occluded. Reconstitution of the external iliac artery via the internal iliac artery. Left lower extremity arterial outflow is widely patent. Left popliteal artery and tibioperoneal trunk are widely patent. Anterior tibial artery is occluded at its origin. Posterior tibial artery occludes at the level of the tibial plafond but is subsequently reconstituted at the level of the plantar arch. Peroneal artery continues to supply the lateral calcaneal artery. Dorsalis pedis artery is occluded.  Visualized lung bases are clear. Visualized heart and pericardium are unremarkable. Status post cholecystectomy. Moderate intra and extrahepatic biliary ductal dilation is stable from multiple prior examinations and likely represents post cholecystectomy change. Liver otherwise unremarkable. Pancreas, spleen, right adrenal gland, and kidneys are unremarkable. Nodular thickening of the left adrenal gland is unchanged. Large broad-based ventral hernia containing multiple loops of large and small bowel is unchanged. Surgical anastomotic staple line noted within the distal small bowel. The bowel is otherwise unremarkable. No free intraperitoneal gas or fluid. Uterus absent. No adnexal masses. Bladder unremarkable. Rectum unremarkable. No pathologic adenopathy within the abdomen and pelvis. Review of the MIP images confirms the above findings. 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 50% stenosis of the IMA origin. Thrombosis of bilateral common iliac artery kissing stents with resultant thrombosis of the terminal abdominal aorta below the level of the IMA origin. Reconstitution of the external iliac arteries bilaterally via internal iliac artery collaterals. Bilateral lower extremity runoff disease. Occlusion of all 3 runoff vessels of the right lower extremity without identifiable reconstitution distal to the mid diaphysis of the tibia. Two vessel runoff to the left foot. Aortic Atherosclerosis (ICD10-I70.0). Electronically Signed  By: Fidela Salisbury M.D.   On: 06/27/2021 01:21   PERIPHERAL VASCULAR CATHETERIZATION  Result Date: 06/29/2021 See surgical note for result.  PERIPHERAL VASCULAR CATHETERIZATION  Result Date: 06/28/2021 See surgical note for result.    Assessment and Recommendation  60 y.o. female with known cardiovascular disease and severe peripheral vascular  disease hypertension hyperlipidemia having acute peripheral vascular ischemia with evidence of chest pain and elevated troponin consistent with non-ST elevation myocardial infarction and known coronary artery disease status post recent cardiac catheterization showing evidence of critical stenoses requiring further intervention at the time now continuing to be stable despite significant peripheral vascular concerns 1.  Proceed to further intervention from the vascular standpoint for improvements of peripheral vascular flow including anticoagulation, stenting, balloon.  Patient would still be at moderate risk for further cardiovascular concerns with surgical intervention due to non-ST elevation myocardial infarction and or need for general anesthesia.  Diligent blood pressure heart rate and fluid balance is important during any procedures 2.  No further cardiac intervention and/or diagnostics necessary at this time 3.  High intensity cholesterol therapy 4.  Other treatment options after above  Signed, Serafina Royals M.D. FACC

## 2021-06-29 NOTE — Op Note (Signed)
[facility]  Percutaneous Study/Intervention Procedural Note   Date of Surgery: 06/29/2021  Surgeon(s):Shemica Meath    Assistants:none  Pre-operative Diagnosis: PAD with rest [name]  Post-operative diagnosis:  Same  Procedure(s) Performed:             1.  Aortogram and iliofemoral angiogram             2.  Catheter placement into aorta from bilateral femoral approaches             3.  Mechanical thrombectomy to the right common and external iliac artery with the penumbra CAT 7D device             4.  Percutaneous transluminal angioplasty of bilateral common iliac arteries up to the distal aorta with 7 mm diameter balloons on each side             5.  StarClose closure device bilateral femoral artery  EBL: 200 cc  Contrast: 45 cc  Fluoro Time: 2.4 minutes  Moderate Conscious Sedation Time: approximately 30 minutes using 4 mg of Versed and 100 mcg of Fentanyl              Indications:  Patient is a 60 y.o.female with ischemia of the lower extremities status post overnight thrombolytic therapy catheters in both iliac arteries for continuous thrombolytic therapy. The patient is brought in for angiography for further evaluation and potential treatment.  Due to the limb threatening nature of the situation, angiogram was performed for attempted limb salvage. The patient is aware that if the procedure fails, amputation would be expected.  The patient also understands that even with successful revascularization, amputation may still be required due to the severity of the situation.  Risks and benefits are discussed and informed consent is obtained.   Procedure:  The patient was identified and appropriate procedural time out was performed.  The patient was then placed supine on the table and prepped and draped in the usual sterile fashion. Moderate conscious sedation was administered during a face to face encounter with the patient throughout the procedure with my supervision  of the RN administering medicines and monitoring the patient's vital signs, pulse oximetry, telemetry and mental status throughout from the start of the procedure until the patient was taken to the recovery room.  The existing thrombolytic catheter from the right side was removed over a Magic torque wire and a pigtail catheter was placed into the aorta.  Imaging demonstrated continued occlusion of the right common and external iliac arteries.  The left iliac stents now had flow although there appeared to be some narrowing or thrombus at the proximal edge of the previously placed stents.  I gave additional heparin.  From the left side, 7 mm diameter by 8 cm length angioplasty balloon was inflated to 10 atm and held in the left common iliac artery.  This would both treat the lesion at the proximal edge of the stent as well as protect thrombus from going down the left side.  On the right side, we upsized to a 7 [place] sheath and used the penumbra CAT 7D device to perform mechanical thrombectomy in the right common and external iliac arteries with 2 passes made.  Following this, there was now flow through the common and external iliac arteries although there did appear to be some thrombus or narrowing at the proximal edge of the previously placed right common iliac artery stent.  I then took a 7 mm diameter by 10  cm length angioplasty balloon in the right common iliac artery and inflated this in a kissing balloon fashion at the same time as the left.  This was taken up to 10 atm for a minute.  Completion imaging showed both iliacs to now be patent without significant stenosis.  Of note, she had a large IMA seen on yesterday's imaging and the IMA was not seen on our completion aortogram today.  There is really not much we can do about this, but this was an incidental finding of concern.  I elected to terminate the procedure. The sheath was removed and StarClose closure device was deployed in the left femoral artery with  excellent hemostatic result.  Similarly, StarClose closure device was deployed in the right femoral artery with excellent hemostatic result.  The patient was taken to the recovery room in stable condition having tolerated the procedure well.  Findings:               Aortogram: Continued occlusion of the right common and external iliac artery stents improved with thrombectomy and angioplasty was used to treat the proximal portion of the stents with patency at the end of the procedure.                Disposition: Patient was taken to the recovery room in stable condition having tolerated the procedure well.  Complications: None  Carla [name] 06/29/2021 9:11 AM   This note was created with Dragon Medical transcription system. Any errors in dictation are purely unintentional.

## 2021-06-29 NOTE — Interval H&P Note (Signed)
History and Physical Interval Note:  06/29/2021 8:11 AM  Carla [name]  has presented today for surgery, with the diagnosis of Iliac occulsion.  The various methods of treatment have been discussed with the patient and family. After consideration of risks, benefits and other options for treatment, the patient has consented to  Procedure(s): LOWER EXTREMITY INTERVENTION (Bilateral) as a surgical intervention.  The patient's history has been reviewed, patient examined, no change in status, stable for surgery.  I have reviewed the patient's chart and labs.  Questions were answered to the patient's satisfaction.     [name]

## 2021-06-29 NOTE — Progress Notes (Signed)
ANTICOAGULATION CONSULT NOTE   Pharmacy Consult for Heparin  Indication: ACS/STEMI  Allergies  Allergen Reactions   Gabapentin     Tremors    Bactrim [Sulfamethoxazole-Trimethoprim] Itching    Patient Measurements: Height: '5\' 2"'$  (157.5 cm) Weight: 104.2 kg (229 lb 11.5 oz) IBW/kg (Calculated) : 50.1 Heparin Dosing Weight:  76.5 kg   Vital Signs: Temp: 98.8 F (37.1 C) (09/07 1200) Temp Source: Oral (09/07 1200) BP: 146/72 (09/07 1200) Pulse Rate: 89 (09/07 1200)  Labs: Recent Labs    06/27/21 0236 06/27/21 0557 06/27/21 0856 06/27/21 1754 06/27/21 1948 06/27/21 2246 06/28/21 0426 06/28/21 0600 06/28/21 1117 06/28/21 1803 06/28/21 2257 06/29/21 0548  HGB  --  12.5  --   --   --   --  12.5  --   --  12.7 12.8 13.0  HCT  --  38.3  --   --   --   --  37.0  --   --  38.3 38.9 38.7  PLT  --  207  --   --   --   --  183  --   --  191 179 160  APTT 25  --  65* 81*  --  72* 82*  --   --   --   --   --   LABPROT 12.3  --   --   --   --   --   --   --   --   --   --   --   INR 0.9  --   --   --   --   --   --   --   --   --   --   --   HEPARINUNFRC <0.10*  --  0.72*  --   --   --  0.72*  --  0.57  --   --   --   CREATININE  --  0.81  --   --   --   --  0.63  --   --   --   --  0.69  TROPONINIHS  --   --   --  893*   < > 1,748* 868* 735*  --   --   --   --    < > = values in this interval not displayed.     Estimated Creatinine Clearance: 83.6 mL/min (by C-G formula based on SCr of 0.69 mg/dL).   Medical History: Past Medical History:  Diagnosis Date   Abdominal aortic atherosclerosis ([place])    a. 05/2017 CTA abd/pelvis: significant atherosclerotic dzs of the infrarenal abd Ao w/ some mural thrombus. No aneurysm or dissection.   Acute focal ischemia of small intestine (HCC)    Acute right-sided low back pain with right-sided sciatica 06/13/2017   AKI (acute kidney injury) ([place]) 07/29/2017   Anginal pain ([place])    a. 08/2012 Lexiscan MV: EF 54%, non ischemia/infarct.    Anxiety and depression    Baker's cyst of knee, right    a. 07/2016 U/S: 4.1 x 1.4 x 2.9 cystic structure in R poplitetal fossa.   Bell's palsy    Borderline diabetes    CHF (congestive heart failure) (HCC)    Chronic anticoagulation    apixaban   Chronic idiopathic constipation    Chronic mesenteric ischemia (HCC)    Chronic pain    COPD (chronic obstructive pulmonary disease) (HCC)    Diastolic dysfunction    a. 05/2017 Echo: Ef 60-65%,  no rwma, Gr1 DD, no source of cardiac emboli.   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 05/2017 CTA Abd/pelvis: apparent thrombus or embolus in prox SMA (70-90%); b. 05/2017 catheter directed tPA, mechanical thrombectomy, and stenting of the SMA.   Essential hypertension with goal blood pressure less than 140/90 01/19/2016   Gastroesophageal reflux disease 01/19/2016   GERD (gastroesophageal reflux disease)    H/O colectomy    History of 2019 novel coronavirus disease (COVID-19) 11/14/2019   History of kidney stones    Hyperlipidemia    Hypertension    Morbid obesity with BMI of 40.0-44.9, adult ([place])    Occlusive mesenteric ischemia ([place]) 06/19/2017   Osteoarthritis    PAD (peripheral artery disease) (HCC)    PAF (paroxysmal atrial fibrillation) (HCC)    Palpitations    Peripheral edema    Personal history of other malignant neoplasm of skin 06/01/2016   Pneumonia    Pre-diabetes    Primary osteoarthritis of both knees 06/13/2017   SBO (small bowel obstruction) ([place]) 08/06/2017   Skin cancer of nose    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HCC)    Vitamin D deficiency     Assessment: Pharmacy consulted to dose heparin in this 60 year old female with arterial blood clots in leg and abdomen.  Pt was on Eliquis 5 mg PO BID.  Per RN, last dose was on 9/1. Heparin Dosing Weight:  76.5 kg   Patient underwent vascular procedure for catheter directed thrombolysis 9/6. Taken back to the cath lab 9/7. Pharmacy consult for  heparin drip for ACS/STEMI.   Goal of Therapy:  aPTT:  66 - 102 Heparin level 0.3-0.7 units/ml Monitor platelets by anticoagulation protocol: Yes   Plan:  Heparin previously infusing via catheters stopped this morning Heparin 3000 units given earlier today Start heparin infusion at 900 units/hr Check aPTT at 2100. Will recheck HL with morning labs, can likely follow HL thereafter. CBC/HL daily while on heparin   Tawnya Crook, PharmD, BCPS Clinical Pharmacist 06/29/2021 1:49 PM

## 2021-06-29 NOTE — Progress Notes (Signed)
Patient yelled out in excruciating pain overnight many times. She was given pain meds but expressed minimal to no relief and was trying to climb out of bed. She complained of pain in her legs rated a 10/10 but stated that she was learning to deal with the pain. Provider notified and changed her pain regimen to a fentanyl PCA. Patient was noted resting once she began to use it. Patient was reminded that she was on bedrest and should lie flat per doctors orders. She readily complied as she expressed concern of her losing her legs. No pulses were felt in the bilateral lower extremities overnight and the hospitalist and the surgeon were notified. Patient remains on anticoagulants. VSS. Right foot noted with cyanotic toes.Patient taken to vascular at this time to follow up with surgeon.

## 2021-06-29 NOTE — Progress Notes (Signed)
ANTICOAGULATION CONSULT NOTE   Pharmacy Consult for Heparin  Indication: ACS/STEMI  Allergies  Allergen Reactions   Gabapentin     Tremors    Bactrim [Sulfamethoxazole-Trimethoprim] Itching    Patient Measurements: Height: '5\' 2"'$  (157.5 cm) Weight: 104.2 kg (229 lb 11.5 oz) IBW/kg (Calculated) : 50.1 Heparin Dosing Weight:  76.5 kg   Vital Signs: Temp: 99 F (37.2 C) (09/07 2000) Temp Source: Oral (09/07 2000) BP: 116/98 (09/07 1900) Pulse Rate: 101 (09/07 2000)  Labs: Recent Labs    06/27/21 0236 06/27/21 0236 06/27/21 0557 06/27/21 0856 06/27/21 1754 06/27/21 2246 06/28/21 0426 06/28/21 0600 06/28/21 1117 06/28/21 1803 06/28/21 2257 06/29/21 0548 06/29/21 2206  HGB  --    < > 12.5  --   --   --  12.5  --   --  12.7 12.8 13.0  --   HCT  --    < > 38.3  --   --   --  37.0  --   --  38.3 38.9 38.7  --   PLT  --    < > 207  --   --   --  183  --   --  191 179 160  --   APTT 25  --   --  65*   < > 72* 82*  --   --   --   --   --  39*  LABPROT 12.3  --   --   --   --   --   --   --   --   --   --   --   --   INR 0.9  --   --   --   --   --   --   --   --   --   --   --   --   HEPARINUNFRC <0.10*  --   --  0.72*  --   --  0.72*  --  0.57  --   --   --   --   CREATININE  --   --  0.81  --   --   --  0.63  --   --   --   --  0.69  --   TROPONINIHS  --   --   --   --    < > 1,748* 868* 735*  --   --   --   --   --    < > = values in this interval not displayed.     Estimated Creatinine Clearance: 83.6 mL/min (by C-G formula based on SCr of 0.69 mg/dL).   Medical History: Past Medical History:  Diagnosis Date   Abdominal aortic atherosclerosis ([place])    a. 05/2017 CTA abd/pelvis: significant atherosclerotic dzs of the infrarenal abd Ao w/ some mural thrombus. No aneurysm or dissection.   Acute focal ischemia of small intestine (HCC)    Acute right-sided low back pain with right-sided sciatica 06/13/2017   AKI (acute kidney injury) ([place]) 07/29/2017   Anginal pain  ([place])    a. 08/2012 Lexiscan MV: EF 54%, non ischemia/infarct.   Anxiety and depression    Baker's cyst of knee, right    a. 07/2016 U/S: 4.1 x 1.4 x 2.9 cystic structure in R poplitetal fossa.   Bell's palsy    Borderline diabetes    CHF (congestive heart failure) (HCC)    Chronic anticoagulation    apixaban   Chronic idiopathic  constipation    Chronic mesenteric ischemia (HCC)    Chronic pain    COPD (chronic obstructive pulmonary disease) (HCC)    Diastolic dysfunction    a. 05/2017 Echo: Ef 60-65%, no rwma, Gr1 DD, no source of cardiac emboli.   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 05/2017 CTA Abd/pelvis: apparent thrombus or embolus in prox SMA (70-90%); b. 05/2017 catheter directed tPA, mechanical thrombectomy, and stenting of the SMA.   Essential hypertension with goal blood pressure less than 140/90 01/19/2016   Gastroesophageal reflux disease 01/19/2016   GERD (gastroesophageal reflux disease)    H/O colectomy    History of 2019 novel coronavirus disease (COVID-19) 11/14/2019   History of kidney stones    Hyperlipidemia    Hypertension    Morbid obesity with BMI of 40.0-44.9, adult ([place])    Occlusive mesenteric ischemia ([place]) 06/19/2017   Osteoarthritis    PAD (peripheral artery disease) (HCC)    PAF (paroxysmal atrial fibrillation) (HCC)    Palpitations    Peripheral edema    Personal history of other malignant neoplasm of skin 06/01/2016   Pneumonia    Pre-diabetes    Primary osteoarthritis of both knees 06/13/2017   SBO (small bowel obstruction) ([place]) 08/06/2017   Skin cancer of nose    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HCC)    Vitamin D deficiency     Assessment: Pharmacy consulted to dose heparin in this 60 year old female with arterial blood clots in leg and abdomen.  Pt was on Eliquis 5 mg PO BID.  Per RN, last dose was on 9/1. Heparin Dosing Weight:  76.5 kg   Patient underwent vascular procedure for catheter directed  thrombolysis 9/6. Taken back to the cath lab 9/7. Pharmacy consult for heparin drip for ACS/STEMI.   Goal of Therapy:  aPTT:  66 - 102 Heparin level 0.3-0.7 units/ml Monitor platelets by anticoagulation protocol: Yes  9/7 2206 aPTT 39, subtherapeutic   Plan:  Bolus 2300 units x 1 Increase heparin infusion to 1150 units/hr Recheck aPTT w/ AM labs (~6 hr after rate change)  Will recheck HL with morning labs, can likely follow HL thereafter. CBC/HL daily while on heparin   Renda Rolls, PharmD, [facility] 06/29/2021 11:03 PM

## 2021-06-30 ENCOUNTER — Encounter: Payer: Self-pay | Admitting: Vascular Surgery

## 2021-06-30 LAB — CBC
HCT: 37 % (ref 36.0–46.0)
Hemoglobin: 12.5 g/dL (ref 12.0–15.0)
MCH: 30.4 pg (ref 26.0–34.0)
MCHC: 33.8 g/dL (ref 30.0–36.0)
MCV: 90 fL (ref 80.0–100.0)
Platelets: 164 10*3/uL (ref 150–400)
RBC: 4.11 MIL/uL (ref 3.87–5.11)
RDW: 15.7 % — ABNORMAL HIGH (ref 11.5–15.5)
WBC: 8.8 10*3/uL (ref 4.0–10.5)
nRBC: 0 % (ref 0.0–0.2)

## 2021-06-30 LAB — HEPARIN LEVEL (UNFRACTIONATED): Heparin Unfractionated: 0.27 IU/mL — ABNORMAL LOW (ref 0.30–0.70)

## 2021-06-30 LAB — APTT: aPTT: 51 seconds — ABNORMAL HIGH (ref 24–36)

## 2021-06-30 MED ORDER — ALPHA-LIPOIC ACID 600 MG PO CAPS: 600.0000 mg | ORAL_CAPSULE | Freq: Every day | ORAL | Status: DC

## 2021-06-30 MED ORDER — APIXABAN 5 MG PO TABS
5.0000 mg | ORAL_TABLET | Freq: Two times a day (BID) | ORAL | Status: DC
  Administered 2021-06-30 – 2021-07-01 (×3): 5 mg via ORAL
  Filled 2021-06-30 (×3): qty 1

## 2021-06-30 MED ORDER — ALBUTEROL SULFATE (2.5 MG/3ML) 0.083% IN NEBU: 2.5000 mg | INHALATION_SOLUTION | Freq: Four times a day (QID) | RESPIRATORY_TRACT | Status: DC | PRN

## 2021-06-30 MED ORDER — HEPARIN BOLUS VIA INFUSION
1100.0000 [IU] | Freq: Once | INTRAVENOUS | Status: AC
  Administered 2021-06-30: 1100 [IU] via INTRAVENOUS
  Filled 2021-06-30: qty 1100

## 2021-06-30 MED ORDER — ALPRAZOLAM 0.5 MG PO TABS
0.5000 mg | ORAL_TABLET | Freq: Two times a day (BID) | ORAL | Status: DC | PRN
Start: 2021-06-30 — End: 2021-07-01
  Administered 2021-06-30: 0.5 mg via ORAL
  Filled 2021-06-30: qty 1

## 2021-06-30 MED ORDER — FERROUS SULFATE 325 (65 FE) MG PO TABS
325.0000 mg | ORAL_TABLET | Freq: Every day | ORAL | Status: DC
  Administered 2021-06-30 – 2021-07-01 (×2): 325 mg via ORAL
  Filled 2021-06-30 (×2): qty 1

## 2021-06-30 MED ORDER — OXYCODONE HCL 5 MG PO TABS
5.0000 mg | ORAL_TABLET | Freq: Four times a day (QID) | ORAL | Status: DC | PRN
Start: 2021-06-30 — End: 2021-07-01
  Administered 2021-06-30 – 2021-07-01 (×3): 5 mg via ORAL
  Filled 2021-06-30 (×4): qty 1

## 2021-06-30 MED ORDER — PREGABALIN 75 MG PO CAPS
200.0000 mg | ORAL_CAPSULE | Freq: Two times a day (BID) | ORAL | Status: DC
  Administered 2021-06-30 – 2021-07-01 (×3): 200 mg via ORAL
  Filled 2021-06-30 (×3): qty 2

## 2021-06-30 MED ORDER — MORPHINE SULFATE (PF) 2 MG/ML IV SOLN: 1.0000 mg | INTRAVENOUS | Status: DC | PRN

## 2021-06-30 MED ORDER — ALBUTEROL SULFATE HFA 108 (90 BASE) MCG/ACT IN AERS: 2.0000 | INHALATION_SPRAY | Freq: Four times a day (QID) | RESPIRATORY_TRACT | Status: DC | PRN

## 2021-06-30 NOTE — Progress Notes (Signed)
PT Cancellation Note  Patient Details Name: LANIE KOCHMAN MRN: [id number] DOB: [birth year]   Cancelled Treatment:    Reason Eval/Treat Not Completed: Medical issues which prohibited therapy (Consult received and chart reviewed.  Patient currently with PAD still in place; RN to remove over lunch.  Will continue to follow and initiate eval in PM s/p device removal and appropriate bedrest time.)  Stevie Charter H. Owens Shark, PT, DPT, NCS 06/30/21, 11:32 AM [phone]

## 2021-06-30 NOTE — Progress Notes (Signed)
[facility] Cardiology [facility] Division Encounter Note  Patient: Carla [name] / Admit Date: 06/26/2021 / Date of Encounter: 06/30/2021, 8:44 AM   Subjective: Patient is feeling much better today than since admission.  The patient had full restoration perfusion to her lower extremities bilaterally after vascular intervention.  Currently she remains on heparin with no evidence of significant bleeding or other complications.  There has been no evidence of chest pain or other anginal symptoms with elevated troponin most consistent with non-ST elevation myocardial infarction and/or significant demand ischemia from evidence of vascular issues.  Previous cardiac catheterization in July showed no evidence of critical disease or requiring further intervention and therefore medical management has been continued and is continuing to work. Review of Systems: Positive for: None Negative for: Vision change, hearing change, syncope, dizziness, nausea, vomiting,diarrhea, bloody stool, stomach pain, cough, congestion, diaphoresis, urinary frequency, urinary pain,skin lesions, skin rashes Others previously listed  Objective: Telemetry: Sinus rhythm Physical Exam: Blood pressure 130/66, pulse 71, temperature 98.3 F (36.8 C), temperature source Oral, resp. rate 16, height '5\' 2"'$  (1.575 m), weight 104.2 kg, SpO2 100 %. Body mass index is 42.02 kg/m. General: Well developed, well nourished, in no acute distress. Head: Normocephalic, atraumatic, sclera non-icteric, no xanthomas, nares are without discharge. Neck: No apparent masses Lungs: Normal respirations with few wheezes, no rhonchi, no rales , no crackles   Heart: Regular rate and rhythm, normal S1 S2, no murmur, no rub, no gallop, PMI is normal size and placement, carotid upstroke normal without bruit, jugular venous pressure normal Abdomen: Soft, non-tender, non-distended with normoactive bowel sounds. No hepatosplenomegaly. Abdominal aorta is normal size without  bruit Extremities: Base edema, no clubbing, no cyanosis, no ulcers,  Peripheral: 2+ radial, 1+ femoral, trace + dorsal pedal pulses Neuro: Alert and oriented. Moves all extremities spontaneously. Psych:  Responds to questions appropriately with a normal affect.   Intake/Output Summary (Last 24 hours) at 06/30/2021 0844 Last data filed at 06/30/2021 0448 Gross per 24 hour  Intake 1789.22 ml  Output 475 ml  Net 1314.22 ml     Inpatient Medications:  . aspirin EC  81 mg Oral Daily  . atorvastatin  80 mg Oral QHS  . Chlorhexidine Gluconate Cloth  6 each Topical Q0600  . fentaNYL   Intravenous Q4H  . ferrous sulfate  325 mg Oral Daily  . isosorbide mononitrate  60 mg Oral Daily  . metoprolol tartrate  50 mg Oral BID  . pantoprazole  40 mg Oral Daily  . pregabalin  200 mg Oral BID  . sodium chloride flush  3 mL Intravenous Q12H   Infusions:  . sodium chloride    . heparin 1,300 Units/hr (06/30/21 0654)    Labs: Recent Labs    06/28/21 0426 06/29/21 0548  NA 138 137  K 4.1 4.3  CL 106 101  CO2 26 27  GLUCOSE 118* 112*  BUN 18 13  CREATININE 0.63 0.69  CALCIUM 8.6* 8.6*    No results for input(s): AST, ALT, ALKPHOS, BILITOT, PROT, ALBUMIN in the last 72 hours. Recent Labs    06/29/21 0548 06/30/21 0440  WBC 8.4 8.8  HGB 13.0 12.5  HCT 38.7 37.0  MCV 90.6 90.0  PLT 160 164    No results for input(s): CKTOTAL, CKMB, TROPONINI in the last 72 hours. Invalid input(s): POCBNP No results for input(s): HGBA1C in the last 72 hours.   Weights: Filed Weights   06/26/21 2241 06/28/21 1830  Weight: 108.9 kg 104.2 kg  Radiology/Studies:  CT ANGIO AO+BIFEM W & OR WO CONTRAST  Result Date: 06/27/2021 CLINICAL DATA:  Right leg pain, cold right foot EXAM: CT ANGIOGRAPHY AOBIFEM WITHOUT AND WITH CONTRAST TECHNIQUE: 100 cc Omnipaque 350 CONTRAST:  172m OMNIPAQUE IOHEXOL 350 MG/ML SOLN COMPARISON:  None. FINDINGS: Abdomen pelvis: The abdominal aorta is of normal caliber.  No aneurysm or dissection. Mild atherosclerotic calcification. Bilateral iliac artery stenting has been performed with a proximal landing zone of the kissing stents within the distal aorta. At this level, the aorta is occluded, as are the common iliac artery stents. No periaortic inflammatory stranding. The celiac axis demonstrates normal anatomic configuration and is widely patent. Stenting of the proximal superior mesenteric artery has been performed with thrombosis of the stent and reconstitution of the mid superior mesenteric artery via the inferior pancreaticoduodenal arcade and arc of [place]iolan. The inferior mesenteric artery is patent and hypertrophied, though demonstrates a 50% stenosis at its origin. Single right and dual left renal arteries are widely patent and demonstrate normal vascular morphology. No aneurysm. Right lower extremity: Right common and external iliac artery stenting has been performed. The right common iliac artery is thrombosed with reconstitution of the external iliac artery via the internal iliac artery. Right lower extremity arterial outflow is widely patent. Limited evaluation of the popliteal artery secondary to streak artifact from right total knee arthroplasty. Occlusion of the anterior tibial artery proximally. Occlusion of the a posterior tibial artery just distal to the tibioperoneal trunk. Occlusion of the peroneal artery at the level of the a mid diaphysis of the tibia. No definite distal reconstitution. Left lower extremity: Left common iliac artery and external iliac artery stenting has been performed. Common iliac artery is occluded. Reconstitution of the external iliac artery via the internal iliac artery. Left lower extremity arterial outflow is widely patent. Left popliteal artery and tibioperoneal trunk are widely patent. Anterior tibial artery is occluded at its origin. Posterior tibial artery occludes at the level of the tibial plafond but is subsequently reconstituted  at the level of the plantar arch. Peroneal artery continues to supply the lateral calcaneal artery. Dorsalis pedis artery is occluded. Visualized lung bases are clear. Visualized heart and pericardium are unremarkable. Status post cholecystectomy. Moderate intra and extrahepatic biliary ductal dilation is stable from multiple prior examinations and likely represents post cholecystectomy change. Liver otherwise unremarkable. Pancreas, spleen, right adrenal gland, and kidneys are unremarkable. Nodular thickening of the left adrenal gland is unchanged. Large broad-based ventral hernia containing multiple loops of large and small bowel is unchanged. Surgical anastomotic staple line noted within the distal small bowel. The bowel is otherwise unremarkable. No free intraperitoneal gas or fluid. Uterus absent. No adnexal masses. Bladder unremarkable. Rectum unremarkable. No pathologic adenopathy within the abdomen and pelvis. Review of the MIP images confirms the above findings. 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 50% stenosis of the IMA origin. Thrombosis of bilateral common iliac artery kissing stents with resultant thrombosis of the terminal abdominal aorta below the level of the IMA origin. Reconstitution of the external iliac arteries bilaterally via internal iliac artery collaterals. Bilateral lower extremity runoff disease. Occlusion of all 3 runoff vessels of the right lower extremity without identifiable reconstitution distal to the mid diaphysis of the tibia. Two vessel runoff to the left foot. Aortic Atherosclerosis (ICD10-I70.0). Electronically Signed   By: AFidela SalisburyM.D.   On: 06/27/2021 01:21   PERIPHERAL VASCULAR CATHETERIZATION  Result Date: 06/29/2021 See surgical note for result.  PERIPHERAL VASCULAR CATHETERIZATION  Result Date: 06/28/2021 See surgical  note for result.    Assessment and Recommendation  60 y.o. female with known cardiovascular disease and severe peripheral vascular disease hypertension hyperlipidemia having acute peripheral vascular ischemia with evidence of chest pain and elevated troponin consistent with non-ST elevation myocardial infarction and known coronary artery disease status post recent cardiac catheterization showing evidence of critical stenoses requiring further intervention at the time now continuing to be stable despite significant peripheral vascular concerns which have all improved at this time medically stable today 1.   Proceed to peripheral vascular rehabilitation without restrictions due to full restoration perfusion to lower extremity without complication.  Patient has not had any further anginal symptoms and therefore will be medically managed 2.  Reinstatement of anticoagulation and single antiplatelet therapy for further risk reduction of cardiovascular event as well as severe peripheral vascular disease needing further medical management.  Anticoagulation and antiplatelet combination choice as per vascular 3.  High intensity cholesterol therapy 4.  No further cardiac diagnostics necessary at this time 5.  Proceed to rehabilitation without restrictions  Signed, Serafina Royals M.D. FACC

## 2021-06-30 NOTE — Progress Notes (Signed)
0800 PCA functioning well. Patient comfortable and talking on phone. 1200 PCA d/ced. PADs removed and right groin central line dressing changed. 1241 Given prn for pain.States she is comfortable now but is concerned about pain later. Talked to family and cried to go home on the phone. After phone conversations remained calm without tears. Given prn Alprazolam earlier for "her nerves".Remains in NSR.

## 2021-06-30 NOTE — Progress Notes (Signed)
Attempted to give report to on 1A but nurse was busy with another pt.... Provided call back number

## 2021-06-30 NOTE — Progress Notes (Signed)
PHARMACIST - PHYSICIAN ORDER COMMUNICATION  CONCERNING: P&T Medication Policy on Herbal Medications  DESCRIPTION:  This patient's order for: alpha lipoic acid   has been noted.  This product(s) is classified as an "herbal" or natural product. Due to a lack of definitive safety studies or FDA approval, nonstandard manufacturing practices, plus the potential risk of unknown drug-drug interactions while on inpatient medications, the Pharmacy and Therapeutics Committee does not permit the use of "herbal" or natural products of this type within [place].   ACTION TAKEN: The pharmacy department is unable to verify this order at this time and your patient has been informed of this safety policy. Please reevaluate patient's clinical condition at discharge and address if the herbal or natural product(s) should be resumed at that time.  

## 2021-06-30 NOTE — Evaluation (Signed)
Physical Therapy Evaluation Patient Details Name: Carla [name] MRN: [id number] DOB: 1961/03/30 Today's Date: 06/30/2021   History of Present Illness  Carla [name] is a 60 y.o. female with medical history significant for peripheral arterial disease status post multiple stents and thrombectomies, SMA thrombosis status post remote intervention, CAD, PAF on Eliquis, COPD, and recurrent ventral hernia scheduled for repair on 06/28/2021, now presenting to the emergency department for evaluation of severe right leg pain. Aortogram and iliofemoral angiogram with catheter placement in A[place] wiht bilateral femoral approaches and mechanical thrombectomy to R common and external iliac artery on 06/29/2021.   Clinical Impression  Pt admitted with above diagnosis. Pt received supine in bed agreeable to PT services. Reports PLOF as household ambulator with no AD and use of w/c for community tasks. Has 24/7 family support for all needs such as household chores and community errands. PT denies pain and that her LE's feel better. Numbness noted in feet R > L to light touch. Not formally assessed. BP in supine prior to mobility: 100/69 mm Hg, HR: 85 BPM, 95% on RA. With [facility] elevated and use of bed rails pt supervision to sit EOB denying dizziness. Lines/lead organized with pt displaying good ability to maintain static sitting balance. Minguard provided for STS to RW with mod VC"s for safe hand placement with poor carryover. SPT over into recliner with minimal side steps performed with no sway, LOB or overall any difficulty. After sitting 2-3 min, Pt amb with RW in room ~20' with step to pattern leading with RLE due to poor sensation so pt can visualize LE as she progresses limb. Minguard provided throughout. No LOB noted and good control of LE's throughout with use of RW. Good sequencing of RW prior to sitting in recliner with min VC's for safe hand placement with good carryover. HR elevated to low 90's with SPO2 > 90%  throughout. Pt will benefit from [facility] PT to progress safe ambulation in home environment to reduce risk of falls. Pt currently with functional limitations due to the deficits listed below (see PT Problem List). Pt will benefit from skilled PT to increase their independence and safety with mobility to allow discharge to the venue listed below.      Follow Up Recommendations Home health PT;Supervision for mobility/OOB    Equipment Recommendations  None recommended by PT    Recommendations for Other Services       Precautions / Restrictions Precautions Precautions: Fall Restrictions Weight Bearing Restrictions: No      Mobility  Bed Mobility Overal bed mobility: Needs Assistance Bed Mobility: Supine to Sit     Supine to sit: Supervision;HOB elevated     General bed mobility comments: Use of bed rail Patient Response: Cooperative  Transfers   Equipment used: Rolling walker (2 wheeled)             General transfer comment: Requires VC's for hand placement  Ambulation/Gait Ambulation/Gait assistance: Min guard Gait Distance (Feet): 20 Feet Assistive device: Rolling walker (2 wheeled) Gait Pattern/deviations: Step-to pattern;Decreased stance time - right;Decreased weight shift to right     General Gait Details: Slow and cautious with RW. Leads with RLE sttep to pattern due to poor sensation in RLE > LLE so pt can visualize LE.  Stairs            Wheelchair Mobility    Modified Rankin (Stroke Patients Only)       Balance Overall balance assessment: Needs assistance Sitting-balance support: Feet supported;Feet unsupported;Bilateral  upper extremity supported Sitting balance-[name]: Fair Sitting balance - Comments: No issues sitting EOB.     Standing balance-[name]: Fair Standing balance comment: Requires UE support on RW                             Pertinent Vitals/Pain Pain Assessment: Faces Faces Pain Scale: No hurt    Home  Living Family/patient expects to be discharged to:: Private residence Living Arrangements: Spouse/significant other Available Help at Discharge: Family;Available 24 hours/day Type of Home: Mobile home Home Access: Stairs to enter Entrance Stairs-Rails: Left Entrance Stairs-Number of Steps: 5 Home Layout: One level Home Equipment: Shower seat;Walker - standard;Wheelchair - manual      Prior Function Level of Independence: Needs assistance   Gait / Transfers Assistance Needed: Repors ind household ambulator with use of W/c in community  ADL's / Homemaking Assistance Needed: Family support in household chores and community errands        Journalist, newspaper        Extremity/Trunk Assessment   Upper Extremity Assessment Upper Extremity Assessment: Overall WFL for tasks assessed    Lower Extremity Assessment Lower Extremity Assessment: Generalized weakness    Cervical / Trunk Assessment Cervical / Trunk Assessment: Normal  Communication   Communication: No difficulties  Cognition Arousal/Alertness: Awake/alert Behavior During Therapy: WFL for tasks assessed/performed Overall Cognitive Status: Within Functional Limits for tasks assessed                                        General Comments      Exercises Other Exercises Other Exercises: Role of PT in acute care. D/c recs   Assessment/Plan    PT Assessment Patient needs continued PT services  PT Problem List Decreased strength;Decreased mobility;Decreased safety awareness;Decreased balance;Decreased knowledge of use of DME;Impaired sensation       PT Treatment Interventions DME instruction;Therapeutic exercise;Gait training;Balance training;Stair training;Neuromuscular re-education;Functional mobility training;Patient/family education;Therapeutic activities    PT Goals (Current goals can be found in the Care Plan section)  Acute Rehab PT Goals Patient Stated Goal: Go home PT Goal Formulation: With  patient Time For Goal Achievement: 07/14/21 Potential to Achieve Goals: Good    Frequency Min 2X/week   Barriers to discharge   3 stairs to enter    Co-evaluation               AM-PAC PT "6 Clicks" Mobility  Outcome Measure Help needed turning from your back to your side while in a flat bed without using bedrails?: A Little Help needed moving from lying on your back to sitting on the side of a flat bed without using bedrails?: A Little Help needed moving to and from a bed to a chair (including a wheelchair)?: A Little Help needed standing up from a chair using your arms (e.g., wheelchair or bedside chair)?: A Little Help needed to walk in hospital room?: A Little Help needed climbing 3-5 steps with a railing? : A Lot 6 Click Score: 17    End of Session Equipment Utilized During Treatment: Gait belt Activity Tolerance: Patient tolerated treatment well Patient left: in chair;with nursing/sitter in room;with call bell/phone within reach Nurse Communication: Mobility status PT Visit Diagnosis: Muscle weakness (generalized) (M62.81);Difficulty in walking, not elsewhere classified (R26.2)    Time: [id number] PT Time Calculation (min) (ACUTE ONLY): 21 min   Charges:  PT Evaluation $PT Eval Moderate Complexity: 1 Mod PT Treatments $Therapeutic Activity: 8-22 mins       Elkin Belfield M. Fairly IV, PT, DPT Physical Therapist- [facility]  06/30/2021, 4:14 PM

## 2021-06-30 NOTE — Progress Notes (Signed)
ANTICOAGULATION CONSULT NOTE   Pharmacy Consult for Heparin  Indication: ACS/STEMI  Allergies  Allergen Reactions   Gabapentin     Tremors    Bactrim [Sulfamethoxazole-Trimethoprim] Itching    Patient Measurements: Height: '5\' 2"'$  (157.5 cm) Weight: 104.2 kg (229 lb 11.5 oz) IBW/kg (Calculated) : 50.1 Heparin Dosing Weight:  76.5 kg   Vital Signs: Temp: 98.3 F (36.8 C) (09/08 0400) Temp Source: Oral (09/08 0400) BP: 130/66 (09/08 0600) Pulse Rate: 71 (09/08 0600)  Labs: Recent Labs    06/27/21 2246 06/28/21 0426 06/28/21 0600 06/28/21 1117 06/28/21 1803 06/28/21 2257 06/29/21 0548 06/29/21 2206 06/30/21 0440  HGB  --  12.5  --   --    < > 12.8 13.0  --  12.5  HCT  --  37.0  --   --    < > 38.9 38.7  --  37.0  PLT  --  183  --   --    < > 179 160  --  164  APTT 72* 82*  --   --   --   --   --  39* 51*  HEPARINUNFRC  --  0.72*  --  0.57  --   --   --   --  0.27*  CREATININE  --  0.63  --   --   --   --  0.69  --   --   TROPONINIHS 1,748* 868* 735*  --   --   --   --   --   --    < > = values in this interval not displayed.     Estimated Creatinine Clearance: 83.6 mL/min (by C-G formula based on SCr of 0.69 mg/dL).   Medical History: Past Medical History:  Diagnosis Date   Abdominal aortic atherosclerosis ([place])    a. 05/2017 CTA abd/pelvis: significant atherosclerotic dzs of the infrarenal abd Ao w/ some mural thrombus. No aneurysm or dissection.   Acute focal ischemia of small intestine (HCC)    Acute right-sided low back pain with right-sided sciatica 06/13/2017   AKI (acute kidney injury) ([place]) 07/29/2017   Anginal pain ([place])    a. 08/2012 Lexiscan MV: EF 54%, non ischemia/infarct.   Anxiety and depression    Baker's cyst of knee, right    a. 07/2016 U/S: 4.1 x 1.4 x 2.9 cystic structure in R poplitetal fossa.   Bell's palsy    Borderline diabetes    CHF (congestive heart failure) (HCC)    Chronic anticoagulation    apixaban   Chronic idiopathic  constipation    Chronic mesenteric ischemia (HCC)    Chronic pain    COPD (chronic obstructive pulmonary disease) (HCC)    Diastolic dysfunction    a. 05/2017 Echo: Ef 60-65%, no rwma, Gr1 DD, no source of cardiac emboli.   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 05/2017 CTA Abd/pelvis: apparent thrombus or embolus in prox SMA (70-90%); b. 05/2017 catheter directed tPA, mechanical thrombectomy, and stenting of the SMA.   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  Pt was on Eliquis 5 mg PO BID.  Per RN, last dose was on 9/1. Heparin Dosing Weight:  76.5 kg   Patient underwent vascular procedure for catheter directed thrombolysis 9/6. Taken back to the cath lab 9/7. Pharmacy consult for heparin drip for ACS/STEMI.   Goal of Therapy:  aPTT:  66 - 102 Heparin level 0.3-0.7 units/ml Monitor platelets by anticoagulation protocol: Yes  9/7 2206 aPTT 39, subtherapeutic 9/8 0440 HL 0.27, aPTT 51, subtherapeutic   Plan:  Bolus 1100 units x 1 Increase  heparin infusion to 1300 units/hr Recheck HL in 6 hours after rate change. CBC daily while on heparin   Renda Rolls, PharmD, [facility] 06/30/2021 6:20 AM

## 2021-06-30 NOTE — Progress Notes (Signed)
[facility] NAME: Carla [name]    MR#:  [id number]  DATE OF BIRTH:  1961/05/19  SUBJECTIVE:   Pt tells me she slept very well last nite On IV Fentanyl PCA pump and IV Heparin gtt Eating well  REVIEW OF SYSTEMS:   Review of Systems  Constitutional:  Negative for chills, fever and weight loss.  HENT:  Negative for ear discharge, ear pain and nosebleeds.   Eyes:  Negative for blurred vision, pain and discharge.  Respiratory:  Negative for sputum production, shortness of breath, wheezing and stridor.   Cardiovascular:  Negative for chest pain, palpitations, orthopnea and PND.  Gastrointestinal:  Negative for abdominal pain, diarrhea, nausea and vomiting.  Genitourinary:  Negative for frequency and urgency.  Musculoskeletal:  Negative for back pain and joint pain.       Right leg pain improving  Neurological:  Negative for sensory change, speech change, focal weakness and weakness.  Psychiatric/Behavioral:  Negative for depression and hallucinations. The patient is not nervous/anxious.   Tolerating Diet:yes Tolerating PT:   DRUG ALLERGIES:   Allergies  Allergen Reactions  . Gabapentin     Tremors   . Bactrim [Sulfamethoxazole-Trimethoprim] Itching    VITALS:  Blood pressure 127/83, pulse 86, temperature 98.3 F (36.8 C), temperature source Oral, resp. rate 16, height '5\' 2"'$  (1.575 m), weight 104.2 kg, SpO2 100 %.  PHYSICAL EXAMINATION:   Physical Exam  GENERAL:  60 y.o.-year-old patient lying in the bed with no acute distress.  LUNGS: Normal breath sounds bilaterally, no wheezing, rales, rhonchi. No use of accessory muscles of respiration.  CARDIOVASCULAR: S1, S2 normal. No murmurs, rubs, or gallops.  ABDOMEN: Soft, nontender, large ventral hernia + chronic  Bowel sounds present. No organomegaly or mass.  EXTREMITIES: dusky hue right LE NEUROLOGIC: non focal PSYCHIATRIC:  patient is alert and oriented x 3.  SKIN: No  obvious rash, lesion, or ulcer.   LABORATORY PANEL:  CBC Recent Labs  Lab 06/30/21 0440  WBC 8.8  HGB 12.5  HCT 37.0  PLT 164     Chemistries  Recent Labs  Lab 06/29/21 0548  NA 137  K 4.3  CL 101  CO2 27  GLUCOSE 112*  BUN 13  CREATININE 0.69  CALCIUM 8.6*    Cardiac Enzymes No results for input(s): TROPONINI in the last 168 hours. RADIOLOGY:  PERIPHERAL VASCULAR CATHETERIZATION  Result Date: 06/29/2021 See surgical note for result.  PERIPHERAL VASCULAR CATHETERIZATION  Result Date: 06/28/2021 See surgical note for result.  ASSESSMENT AND PLAN:   NIKEMA WILKERSON is a 60 y.o. female with medical history significant for peripheral arterial disease status post multiple stents and thrombectomies, SMA thrombosis status post remote intervention, CAD, PAF on Eliquis, COPD, and recurrent ventral hernia scheduled for repair on 06/28/2021, now presenting to the emergency department for evaluation of severe right leg pain.  The patient reports that she has been holding Eliquis for the upcoming surgery and then developed pain in the right leg yesterday morning that has become increasingly severe.     CTA LE notable for thrombosis of stented proximal SMA with reconstitution, thrombosis of bilateral iliac artery stents with resultant thrombosis of abdominal aorta and bilateral lower extremity runoff disease with occlusion of all 3 RLE runoffs.  Bilateral Lower extremity arterial thrombosis; Right > left with severe PAD   - Pt with PAD, SMA thrombosis, and hx of multiple vascular interventions presents with severe right leg pain  after stopping Eliquis for ventral hernia repair 9/6 and is found to have cool and mottled right toes concerning for acute on chronic ischemia  - Vascular surgery consult with Dr dew - Continue IV heparin and pain-control --9/6-- patient to undergo lower extremity angiogram with TPA for severely occluded vessels and stents --9/7--pt underwent intervention again  by Dr Lucky Cowboy -- s/p bilateral iliac thrombectomy and angioplasty  --on Fentanyl PCA pump --9/8-- patient report right leg pain is improving and feels a much better. She slept well. Eating well. -- Discussed with Dr. Lucky Cowboy-- okay to discontinue heparin drip. Resume eliquis. DC fentanyl pump. Will switch to oral and IV PRN pain meds. Physical therapy to see patient. Okay from vascular standpoint.    CAD  - Recent LHC with 60% stenosis involving proximal RCA and 50% 1st diagonal  - No anginal complaints today - Continue medical therapy  -- patient seen by Dr. Nehemiah Massed. No cardiac evaluation recommended at present    COPD  - No cough or wheezing on admission; she reports no cigarettes in 2-3 weeks  - Continue Combivent, smoking cessation     chronic ventral hernia  - Was scheduled for repair on 06/28/21 -- canceled. Patient follows with Dr. Dahlia Byes - No acute pain or peritoneal signs    PAF  - In sinus rhythm on admission  - Eliquis has been on hold for upcoming surgery, currently on IV heparin, continue metoprolol     HL -- continue statins  physical therapy to see patient. If continues to do well anticipate discharge tomorrow.    Procedures: LE angiogram 9/6 Family communication : unable to reach two daughters [name] and Pavielle--VM full Consults : vascular, cardiology CODE STATUS: full DVT Prophylaxis : eliquis Level of care: Med-Surg Status is: Inpatient  Remains inpatient appropriate because:Inpatient level of care appropriate due to severity of illness  Dispo: The patient is from: Home              Anticipated d/c is to: Home              Patient currently is not medically stable to d/c.   Difficult to place patient No  transfer out of ICU.      TOTAL TIME TAKING CARE OF THIS PATIENT: 25 minutes.  >50% time spent on counselling and coordination of care  Note: This dictation was prepared with Dragon dictation along with smaller phrase technology. Any transcriptional  errors that result from this process are unintentional.  Fritzi Mandes M.D    [facility]   CC: Primary care physician; Kirk Ruths, MD Patient ID: SHANNIKA WINKLER, female   DOB: 03/05/1961, 60 y.o.   MRN: [id number]

## 2021-06-30 NOTE — Progress Notes (Signed)
Wasted 62m of fentanyl from PCA syringe with charge Megan,RN- charge nurse. Pt without any complaints overnight. No significant events overnight. Pt without any complaints.

## 2021-07-01 MED ORDER — HYDROCODONE-ACETAMINOPHEN 5-325 MG PO TABS
2.0000 | ORAL_TABLET | Freq: Once | ORAL | Status: AC
  Administered 2021-07-01: 2 via ORAL
  Filled 2021-07-01: qty 2

## 2021-07-01 MED ORDER — HYDROCODONE-ACETAMINOPHEN 5-325 MG PO TABS: 1.0000 | ORAL_TABLET | Freq: Three times a day (TID) | ORAL | 0 refills | Status: DC | PRN

## 2021-07-01 MED ORDER — ISOSORBIDE MONONITRATE ER 60 MG PO TB24: 60.0000 mg | ORAL_TABLET | Freq: Every day | ORAL | 0 refills | Status: DC

## 2021-07-01 NOTE — Progress Notes (Signed)
[facility] Encounter Note  Patient: Carla [name] / Admit Date: 06/26/2021 / Date of Encounter: 07/01/2021, 3:32 PM   Subjective: 9/8.  Patient is feeling much better today than since admission.  The patient had full restoration perfusion to her lower extremities bilaterally after vascular intervention.  Currently she remains on heparin with no evidence of significant bleeding or other complications.  There has been no evidence of chest pain or other anginal symptoms with elevated troponin most consistent with non-ST elevation myocardial infarction and/or significant demand ischemia from evidence of vascular issues.  Previous cardiac catheterization in July showed no evidence of critical disease or requiring further intervention and therefore medical management has been continued and is continuing to work.  9/9.  Patient feeling much better today with no evidence of significant claudication type symptoms or injury.  No evidence of chest discomfort or cardiovascular symptoms today.  Patient is on appropriate medication management from the cardiovascular standpoint and okay if ambulating well for discharged home Review of Systems: Positive for: None Negative for: Vision change, hearing change, syncope, dizziness, nausea, vomiting,diarrhea, bloody stool, stomach pain, cough, congestion, diaphoresis, urinary frequency, urinary pain,skin lesions, skin rashes Others previously listed  Objective: Telemetry: Sinus rhythm Physical Exam: Blood pressure 105/65, pulse 66, temperature 98 F (36.7 C), temperature source Oral, resp. rate 14, height '5\' 2"'$  (1.575 m), weight 104.2 kg, SpO2 98 %. Body mass index is 42.02 kg/m. General: Well developed, well nourished, in no acute distress. Head: Normocephalic, atraumatic, sclera non-icteric, no xanthomas, nares are without discharge. Neck: No apparent masses Lungs: Normal respirations with few wheezes, no rhonchi, no rales , no crackles    Heart: Regular rate and rhythm, normal S1 S2, no murmur, no rub, no gallop, PMI is normal size and placement, carotid upstroke normal without bruit, jugular venous pressure normal Abdomen: Soft, non-tender, non-distended with normoactive bowel sounds. No hepatosplenomegaly. Abdominal aorta is normal size without bruit Extremities: Base edema, no clubbing, no cyanosis, no ulcers,  Peripheral: 2+ radial, 1+ femoral, trace + dorsal pedal pulses Neuro: Alert and oriented. Moves all extremities spontaneously. Psych:  Responds to questions appropriately with a normal affect.   Intake/Output Summary (Last 24 hours) at 07/01/2021 1532 Last data filed at 07/01/2021 1025 Gross per 24 hour  Intake 587 ml  Output 1150 ml  Net -563 ml     Inpatient Medications:  . apixaban  5 mg Oral BID  . aspirin EC  81 mg Oral Daily  . atorvastatin  80 mg Oral QHS  . Chlorhexidine Gluconate Cloth  6 each Topical Q0600  . ferrous sulfate  325 mg Oral Daily  . isosorbide mononitrate  60 mg Oral Daily  . metoprolol tartrate  50 mg Oral BID  . pantoprazole  40 mg Oral Daily  . pregabalin  200 mg Oral BID  . sodium chloride flush  3 mL Intravenous Q12H   Infusions:  . sodium chloride      Labs: Recent Labs    06/29/21 0548  NA 137  K 4.3  CL 101  CO2 27  GLUCOSE 112*  BUN 13  CREATININE 0.69  CALCIUM 8.6*    No results for input(s): AST, ALT, ALKPHOS, BILITOT, PROT, ALBUMIN in the last 72 hours. Recent Labs    06/29/21 0548 06/30/21 0440  WBC 8.4 8.8  HGB 13.0 12.5  HCT 38.7 37.0  MCV 90.6 90.0  PLT 160 164    No results for input(s): CKTOTAL, CKMB, TROPONINI in the last  72 hours. Invalid input(s): POCBNP No results for input(s): HGBA1C in the last 72 hours.   Weights: Filed Weights   06/26/21 2241 06/28/21 1830  Weight: 108.9 kg 104.2 kg     Radiology/Studies:  CT ANGIO AO+BIFEM W & OR WO CONTRAST  Result Date: 06/27/2021 CLINICAL DATA:  Right leg pain, cold right foot EXAM: CT  ANGIOGRAPHY AOBIFEM WITHOUT AND WITH CONTRAST TECHNIQUE: 100 cc Omnipaque 350 CONTRAST:  142m OMNIPAQUE IOHEXOL 350 MG/ML SOLN COMPARISON:  None. FINDINGS: Abdomen pelvis: The abdominal aorta is of normal caliber. No aneurysm or dissection. Mild atherosclerotic calcification. Bilateral iliac artery stenting has been performed with a proximal landing zone of the kissing stents within the distal aorta. At this level, the aorta is occluded, as are the common iliac artery stents. No periaortic inflammatory stranding. The celiac axis demonstrates normal anatomic configuration and is widely patent. Stenting of the proximal superior mesenteric artery has been performed with thrombosis of the stent and reconstitution of the mid superior mesenteric artery via the inferior pancreaticoduodenal arcade and arc of [place]iol[place]. The inferior mesenteric artery is patent and hypertrophied, though demonstrates a 50% stenosis at its origin. Single right and dual left renal arteries are widely patent and demonstrate normal vascular morphology. No aneurysm. Right lower extremity: Right common and external iliac artery stenting has been performed. The right common iliac artery is thrombosed with reconstitution of the external iliac artery via the internal iliac artery. Right lower extremity arterial outflow is widely patent. Limited evaluation of the popliteal artery secondary to streak artifact from right total knee arthroplasty. Occlusion of the anterior tibial artery proximally. Occlusion of the a posterior tibial artery just distal to the tibioperoneal trunk. Occlusion of the peroneal artery at the level of the a mid diaphysis of the tibia. No definite distal reconstitution. Left lower extremity: Left common iliac artery and external iliac artery stenting has been performed. Common iliac artery is occluded. Reconstitution of the external iliac artery via the internal iliac artery. Left lower extremity arterial outflow is widely patent.  Left popliteal artery and tibioperoneal trunk are widely patent. Anterior tibial artery is occluded at its origin. Posterior tibial artery occludes at the level of the tibial plafond but is subsequently reconstituted at the level of the plantar arch. Peroneal artery continues to supply the lateral calcaneal artery. Dorsalis pedis artery is occluded. Visualized lung bases are clear. Visualized heart and pericardium are unremarkable. Status post cholecystectomy. Moderate intra and extrahepatic biliary ductal dilation is stable from multiple prior examinations and likely represents post cholecystectomy change. Liver otherwise unremarkable. Pancreas, spleen, right adrenal gland, and kidneys are unremarkable. Nodular thickening of the left adrenal gland is unchanged. Large broad-based ventral hernia containing multiple loops of large and small bowel is unchanged. Surgical anastomotic staple line noted within the distal small bowel. The bowel is otherwise unremarkable. No free intraperitoneal gas or fluid. Uterus absent. No adnexal masses. Bladder unremarkable. Rectum unremarkable. No pathologic adenopathy within the abdomen and pelvis. Review of the MIP images confirms the above findings. 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 50% stenosis of the IMA origin. Thrombosis of bilateral common iliac artery kissing stents with resultant thrombosis of the terminal abdominal aorta below the level of the IMA origin. Reconstitution of the external iliac arteries bilaterally via internal iliac artery collaterals. Bilateral lower extremity runoff disease. Occlusion of all 3 runoff vessels of the right lower extremity without identifiable reconstitution distal  to the mid diaphysis of the tibia. Two vessel runoff to the left foot. Aortic Atherosclerosis (ICD10-I70.0). Electronically Signed   By:  Fidela Salisbury M.D.   On: 06/27/2021 01:21   PERIPHERAL VASCULAR CATHETERIZATION  Result Date: 06/29/2021 See surgical note for result.  PERIPHERAL VASCULAR CATHETERIZATION  Result Date: 06/28/2021 See surgical note for result.    Assessment and Recommendation  60 y.o. female with known cardiovascular disease and severe peripheral vascular disease hypertension hyperlipidemia having acute peripheral vascular ischemia with evidence of chest pain and elevated troponin consistent with non-ST elevation myocardial infarction and known coronary artery disease status post recent cardiac catheterization showing evidence of critical stenoses requiring further intervention at the time now continuing to be stable despite significant peripheral vascular concerns which have all improved at this time medically stable today 1.   Proceed to peripheral vascular rehabilitation without restrictions due to full restoration perfusion to lower extremity without complication.  Patient has not had any further anginal symptoms and therefore will be medically managed 2.  Reinstatement of anticoagulation and single antiplatelet therapy for further risk reduction of cardiovascular event as well as severe peripheral vascular disease needing further medical management.  Anticoagulation and antiplatelet combination choice as per vascular 3.  High intensity cholesterol therapy 4.  No further cardiac diagnostics necessary at this time 5.  Proceed to rehabilitation without restrictions 6.  Okay for discharged home from cardiac standpoint at this time with follow-up in 1 week Signed, Serafina Royals M.D. FACC

## 2021-07-01 NOTE — Plan of Care (Signed)
Pt admitted to unit this shift. Pt alert and oriented, call light within reach, BSC beside bed. Pt's pain controlled with prn oxycodone. Problem: Education: Goal: Knowledge of General Education information will improve Description: Including pain rating scale, medication(s)/side effects and non-pharmacologic comfort measures Outcome: Progressing   Problem: Health Behavior/Discharge Planning: Goal: Ability to manage health-related needs will improve Outcome: Progressing   Problem: Clinical Measurements: Goal: Ability to maintain clinical measurements within normal limits will improve Outcome: Progressing Goal: Will remain free from infection Outcome: Progressing Goal: Diagnostic test results will improve Outcome: Progressing Goal: Respiratory complications will improve Outcome: Progressing Goal: Cardiovascular complication will be avoided Outcome: Progressing   Problem: Activity: Goal: Risk for activity intolerance will decrease Outcome: Progressing   Problem: Coping: Goal: Level of anxiety will decrease Outcome: Progressing   Problem: Elimination: Goal: Will not experience complications related to bowel motility Outcome: Progressing Goal: Will not experience complications related to urinary retention Outcome: Progressing   Problem: Pain Managment: Goal: General experience of comfort will improve Outcome: Progressing   Problem: Safety: Goal: Ability to remain free from injury will improve Outcome: Progressing   Problem: Skin Integrity: Goal: Risk for impaired skin integrity will decrease Outcome: Progressing

## 2021-07-01 NOTE — Discharge Summary (Signed)
[facility] NAME: Carla [name]    MR#:  [id number]  DATE OF BIRTH:  11/13/1960  DATE OF ADMISSION:  06/26/2021 ADMITTING PHYSICIAN: Vianne Bulls, MD  DATE OF DISCHARGE: 07/01/2021  PRIMARY CARE PHYSICIAN: Kirk Ruths, MD    ADMISSION DIAGNOSIS:  Arterial occlusion [I70.90] 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[place]) [id number]  DISCHARGE DIAGNOSIS:   Severe PAD Right >left   SECONDARY DIAGNOSIS:   Past Medical History:  Diagnosis Date  . Abdominal aortic atherosclerosis ([place])    a. 05/2017 CTA abd/pelvis: significant atherosclerotic dzs of the infrarenal abd Ao w/ some mural thrombus. No aneurysm or dissection.  . Acute focal ischemia of small intestine ([place])   . Acute right-sided low back pain with right-sided sciatica 06/13/2017  . AKI (acute kidney injury) ([place]) 07/29/2017  . Anginal pain ([place])    a. 08/2012 Lexiscan MV: EF 54%, non ischemia/infarct.  . Anxiety and depression   . Baker's cyst of knee, right    a. 07/2016 U/S: 4.1 x 1.4 x 2.9 cystic structure in R poplitetal fossa.  . Bell's palsy   . Borderline diabetes   . CHF (congestive heart failure) ([place])   . Chronic anticoagulation    apixaban  . Chronic idiopathic constipation   . Chronic mesenteric ischemia ([place])   . Chronic pain   . COPD (chronic obstructive pulmonary disease) ([place])   . Diastolic dysfunction    a. 05/2017 Echo: Ef 60-65%, no rwma, Gr1 DD, no source of cardiac emboli.  [name] Dyspnea   . 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 05/2017 CTA Abd/pelvis: apparent thrombus or embolus in prox SMA (70-90%); b. 05/2017 catheter directed tPA, mechanical thrombectomy, and stenting of the SMA.  . Essential hypertension with goal blood pressure less than 140/90 01/19/2016  . Gastroesophageal reflux disease 01/19/2016  . GERD (gastroesophageal reflux disease)   . H/O colectomy   . History of 2019 novel coronavirus disease (COVID-19) 11/14/2019  .  History of kidney stones   . Hyperlipidemia   . Hypertension   . Morbid obesity with BMI of 40.0-44.9, adult ([place])   . Occlusive mesenteric ischemia ([place]) 06/19/2017  . Osteoarthritis   . PAD (peripheral artery disease) ([place])   . PAF (paroxysmal atrial fibrillation) ([place])   . Palpitations   . Peripheral edema   . Personal history of other malignant neoplasm of skin 06/01/2016  . Pneumonia   . Pre-diabetes   . Primary osteoarthritis of both knees 06/13/2017  . SBO (small bowel obstruction) ([place]) 08/06/2017  . Skin cancer of nose   . 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 Vitamin D deficiency     HOSPITAL COURSE:  Carla [name] is a 60 y.o. female with medical history significant for peripheral arterial disease status post multiple stents and thrombectomies, SMA thrombosis status post remote intervention, CAD, PAF on Eliquis, COPD, and recurrent ventral hernia scheduled for repair on 06/28/2021, now presenting to the emergency department for evaluation of severe right leg pain.  The patient reports that she has been holding Eliquis for the upcoming surgery and then developed pain in the right leg yesterday morning that has become increasingly severe.     CTA LE notable for thrombosis of stented proximal SMA with reconstitution, thrombosis of bilateral iliac artery stents with resultant thrombosis of abdominal aorta and bilateral lower extremity runoff disease with occlusion of all 3 RLE runoffs.   Bilateral Lower extremity arterial thrombosis; Right >  left with severe PAD   - Pt with PAD, SMA thrombosis, and hx of multiple vascular interventions presents with severe right leg pain after stopping Eliquis for ventral hernia repair 9/6 and is found to have cool and mottled right toes concerning for acute on chronic ischemia  - Vascular surgery consult with Dr dew - Continue IV heparin and pain-control --9/6-- patient to undergo lower extremity angiogram with TPA for severely occluded  vessels and stents --9/7--pt underwent intervention again by Dr Lucky Cowboy -- s/p bilateral iliac thrombectomy and angioplasty  --on Fentanyl PCA pump --9/8-- patient report right leg pain is improving and feels a much better. She slept well. Eating well. -- Discussed with Dr. Lucky Cowboy-- okay to discontinue heparin drip. Resume eliquis. DC fentanyl pump. Will switch to oral and IV PRN pain meds. Physical therapy to see patient. Okay from vascular standpoint. -- 9/9--overall ok. Seen by PT and recommends HHPT    CAD  - Recent LHC with 60% stenosis involving proximal RCA and 50% 1st diagonal  - No anginal complaints today - Continue medical therapy  -- patient seen by Dr. Nehemiah Massed. No cardiac evaluation recommended at present    COPD  - No cough or wheezing on admission; she reports no cigarettes in 2-3 weeks  - Continue Combivent, smoking cessation     chronic ventral hernia  - Was scheduled for repair on 06/28/21 -- canceled. Patient follows with Dr. Dahlia Byes - No acute pain or peritoneal signs    PAF  - In sinus rhythm on admission  - Eliquis has been on hold for upcoming surgery, currently on IV heparin, continue metoprolol     HL -- continue statins       Procedures: LE angiogram 9/6 Family communication : d/w Pavielle on the phone 9/8l Consults : vascular, cardiology CODE STATUS: full DVT Prophylaxis : eliquis Level of care: Med-Surg Status is: Inpatient     Dispo: The patient is from: Home              Anticipated d/c is to: Home today with [facility]              Patient currently is medically stable to d/c.              Difficult to place patient No  CONSULTS OBTAINED:  Treatment Team:  Algernon Huxley, MD  DRUG ALLERGIES:   Allergies  Allergen Reactions  . Gabapentin     Tremors   . Bactrim [Sulfamethoxazole-Trimethoprim] Itching    DISCHARGE MEDICATIONS:   Allergies as of 07/01/2021       Reactions   Gabapentin    Tremors    Bactrim [sulfamethoxazole-trimethoprim] Itching         Medication List     STOP taking these medications    amLODipine 5 MG tablet Commonly known as: NORVASC       TAKE these medications    albuterol 108 (90 Base) MCG/ACT inhaler Commonly known as: VENTOLIN HFA Inhale 2 puffs into the lungs every 6 (six) hours as needed for wheezing or shortness of breath.   Alpha-Lipoic Acid 600 MG Caps Take 1 capsule (600 mg total) by mouth daily.   ALPRAZolam 0.5 MG tablet Commonly known as: Xanax Take 1 tablet (0.5 mg total) by mouth 2 (two) times daily as needed for anxiety.   apixaban 5 MG Tabs tablet Commonly known as: ELIQUIS Take 1 tablet (5 mg total) by mouth 2 (two) times daily.   aspirin EC 81 MG tablet  Take 81 mg by mouth daily. Swallow whole.   atorvastatin 80 MG tablet Commonly known as: LIPITOR Take 80 mg by mouth at bedtime.   Combivent Respimat 20-100 MCG/ACT Aers respimat Generic drug: Ipratropium-Albuterol Inhale 1 puff into the lungs daily as needed for wheezing or shortness of breath.   ferrous sulfate 325 (65 FE) MG tablet Take 325 mg by mouth daily.   HYDROcodone-acetaminophen 5-325 MG tablet Commonly known as: NORCO/VICODIN Take 1 tablet by mouth every 8 (eight) hours as needed for moderate pain.   isosorbide mononitrate 60 MG 24 hr tablet Commonly known as: IMDUR Take 1 tablet (60 mg total) by mouth daily.   metoprolol tartrate 50 MG tablet Commonly known as: LOPRESSOR Take 1 tablet (50 mg total) by mouth 2 (two) times daily.   nitroGLYCERIN 0.4 MG SL tablet Commonly known as: NITROSTAT Place 0.4 mg under the tongue as directed.   omeprazole 20 MG capsule Commonly known as: PRILOSEC Take 20 mg by mouth 2 (two) times daily before a meal.   pregabalin 100 MG capsule Commonly known as: LYRICA Take 100 mg by mouth 3 (three) times daily. What changed: Another medication with the same name was removed. Continue taking this medication, and follow the directions you see here.        If  you experience worsening of your admission symptoms, develop shortness of breath, life threatening emergency, suicidal or homicidal thoughts you must seek medical attention immediately by calling 911 or calling your MD immediately  if symptoms less severe.  You Must read complete instructions/literature along with all the possible adverse reactions/side effects for all the Medicines you take and that have been prescribed to you. Take any new Medicines after you have completely understood and accept all the possible adverse reactions/side effects.   Please note  You were cared for by a hospitalist during your hospital stay. If you have any questions about your discharge medications or the care you received while you were in the hospital after you are discharged, you can call the unit and asked to speak with the hospitalist on call if the hospitalist that took care of you is not available. Once you are discharged, your primary care physician will handle any further medical issues. Please note that NO REFILLS for any discharge medications will be authorized once you are discharged, as it is imperative that you return to your primary care physician (or establish a relationship with a primary care physician if you do not have one) for your aftercare needs so that they can reassess your need for medications and monitor your lab values. Today   SUBJECTIVE  Leg pain+   VITAL SIGNS:  Blood pressure 120/90, pulse 82, temperature (!) 97.4 F (36.3 C), temperature source Oral, resp. rate 16, height '5\' 2"'$  (1.575 m), weight 104.2 kg, SpO2 92 %.  I/O:   Intake/Output Summary (Last 24 hours) at 07/01/2021 1015 Last data filed at 07/01/2021 0641 Gross per 24 hour  Intake 817 ml  Output 1150 ml  Net -333 ml    PHYSICAL EXAMINATION:  GENERAL:  60 y.o.-year-old patient lying in the bed with no acute distress.  LUNGS: Normal breath sounds bilaterally, no wheezing, rales,rhonchi or crepitation. No use of  accessory muscles of respiration.  CARDIOVASCULAR: S1, S2 normal. No murmurs, rubs, or gallops.  ABDOMEN: Soft, non-tender, non-distended. Bowel sounds present. No organomegaly or mass.  EXTREMITIES: No pedal edema, cyanosis, or clubbing.  NEUROLOGIC: non focal PSYCHIATRIC: The patient is alert and oriented x  3.  SKIN: No obvious rash, lesion, or ulcer.   DATA REVIEW:   CBC  Recent Labs  Lab 06/30/21 0440  WBC 8.8  HGB 12.5  HCT 37.0  PLT 164    Chemistries  Recent Labs  Lab 06/29/21 0548  NA 137  K 4.3  CL 101  CO2 27  GLUCOSE 112*  BUN 13  CREATININE 0.69  CALCIUM 8.6*    Microbiology Results   Recent Results (from the past 240 hour(s))  SARS CORONAVIRUS 2 (TAT 6-24 HRS) Nasopharyngeal Nasopharyngeal Swab     Status: None   Collection Time: 06/24/21  9:15 AM   Specimen: Nasopharyngeal Swab  Result Value Ref Range Status   SARS Coronavirus 2 NEGATIVE NEGATIVE Final    Comment: (NOTE) SARS-CoV-2 target nucleic acids are NOT DETECTED.  The SARS-CoV-2 RNA is generally detectable in upper and lower respiratory specimens during the acute phase of infection. Negative results do not preclude SARS-CoV-2 infection, do not rule out co-infections with other pathogens, and should not be used as the sole basis for treatment or other patient management decisions. Negative results must be combined with clinical observations, patient history, and epidemiological information. The expected result is Negative.  Fact Sheet for Patients: SugarRoll.be  Fact Sheet for Healthcare Providers: https://www.woods-mathews.com/  This test is not yet approved or cleared by the [place] FDA and  has been authorized for detection and/or diagnosis of SARS-CoV-2 by FDA under an Emergency Use Authorization (EUA). This EUA will remain  in effect (meaning this test can be used) for the duration of the COVID-19 declaration under Se ction 564(b)(1)  of the Act, 21 U.S.C. section 360bbb-3(b)(1), unless the authorization is terminated or revoked sooner.  Performed at [facility], [place] [address] [place], [place] [address]   Resp Panel by RT-PCR (Flu A&B, Covid) Nasopharyngeal Swab     Status: None   Collection Time: 06/27/21  3:35 AM   Specimen: Nasopharyngeal Swab; Nasopharyngeal(NP) swabs in vial transport medium  Result Value Ref Range Status   SARS Coronavirus 2 by RT PCR NEGATIVE NEGATIVE Final    Comment: (NOTE) SARS-CoV-2 target nucleic acids are NOT DETECTED.  The SARS-CoV-2 RNA is generally detectable in upper respiratory specimens during the acute phase of infection. The lowest concentration of SARS-CoV-2 viral copies this assay can detect is 138 copies/mL. A negative result does not preclude SARS-Cov-2 infection and should not be used as the sole basis for treatment or other patient management decisions. A negative result may occur with  improper specimen collection/handling, submission of specimen other than nasopharyngeal swab, presence of viral mutation(s) within the areas targeted by this assay, and inadequate number of viral copies(<138 copies/mL). A negative result must be combined with clinical observations, patient history, and epidemiological information. The expected result is Negative.  Fact Sheet for Patients:  EntrepreneurPulse.com.au  Fact Sheet for Healthcare Providers:  IncredibleEmployment.be  This test is no t yet approved or cleared by the [place] FDA and  has been authorized for detection and/or diagnosis of SARS-CoV-2 by FDA under an Emergency Use Authorization (EUA). This EUA will remain  in effect (meaning this test can be used) for the duration of the COVID-19 declaration under Section 564(b)(1) of the Act, 21 U.S.C.section 360bbb-3(b)(1), unless the authorization is terminated  or revoked sooner.       Influenza A by PCR NEGATIVE  NEGATIVE Final   Influenza B by PCR NEGATIVE NEGATIVE Final    Comment: (NOTE) The Xpert Xpress SARS-CoV-2/FLU/RSV  plus assay is intended as an aid in the diagnosis of influenza from Nasopharyngeal swab specimens and should not be used as a sole basis for treatment. Nasal washings and aspirates are unacceptable for Xpert Xpress SARS-CoV-2/FLU/RSV testing.  Fact Sheet for Patients: EntrepreneurPulse.com.au  Fact Sheet for Healthcare Providers: IncredibleEmployment.be  This test is not yet approved or cleared by the [place] FDA and has been authorized for detection and/or diagnosis of SARS-CoV-2 by FDA under an Emergency Use Authorization (EUA). This EUA will remain in effect (meaning this test can be used) for the duration of the COVID-19 declaration under Section 564(b)(1) of the Act, 21 U.S.C. section 360bbb-3(b)(1), unless the authorization is terminated or revoked.  Performed at [facility], [address] [place], [place] [address]   MRSA Next Gen by PCR, Nasal     Status: None   Collection Time: 06/28/21  6:31 PM   Specimen: Nasal Mucosa; Nasal Swab  Result Value Ref Range Status   MRSA by PCR Next Gen NOT DETECTED NOT DETECTED Final    Comment: (NOTE) The GeneXpert MRSA Assay (FDA approved for NASAL specimens only), is one component of a comprehensive MRSA colonization surveillance program. It is not intended to diagnose MRSA infection nor to guide or monitor treatment for MRSA infections. Test performance is not FDA approved in patients less than 6 years old. Performed at [facility], [address] [place], [place] [address]     RADIOLOGY:  No results found.   CODE STATUS:     Code Status Orders  (From admission, onward)           Start     Ordered   06/27/21 0450  Full code  Continuous        06/27/21 0451           Code Status History     Date Active Date Inactive Code Status  Order ID Comments User Context   05/17/2021 1335 05/17/2021 2114 Full Code [id number]  Yolonda Kida, MD Inpatient   01/24/2021 1657 01/27/2021 1849 Full Code [id number]  Leonides Schanz Inpatient   01/10/2021 1331 01/11/2021 0004 Full Code [id number]  Algernon Huxley, MD Inpatient   12/13/2020 1057 12/13/2020 1748 Full Code [id number]  Algernon Huxley, MD Inpatient   11/17/2019 1103 11/17/2019 1103 Full Code [id number]  Sela Hua, PA-C Inpatient   11/17/2019 1103 11/17/2019 1103 Full Code [id number]  Sela Hua, PA-C Inpatient   02/03/2019 0839 02/04/2019 1445 Full Code [id number]  Nicholes Mango, MD ED   07/11/2018 1147 07/15/2018 1456 Full Code [id number]  Corky Mull, MD Inpatient   06/15/2018 0142 06/15/2018 1632 Full Code [id number]  Lance Coon, MD Inpatient   04/08/2018 0117 04/09/2018 1913 Full Code [id number]  Lance Coon, MD Inpatient   01/24/2018 2305 01/31/2018 1915 Full Code [id number]  Lance Coon, MD Inpatient   08/06/2017 2022 08/17/2017 2158 Full Code [id number]  Florene Glen, MD ED   07/29/2017 1600 08/02/2017 1814 Full Code [id number]  Gorden Harms, MD Inpatient   06/19/2017 2019 06/28/2017 1638 Full Code [id number]  Vickie Epley, MD ED        TOTAL TIME TAKING CARE OF THIS PATIENT: 35 minutes.    Fritzi Mandes M.D  [facility]    CC: Primary care physician; Kirk Ruths, MD

## 2021-07-01 NOTE — Care Management Important Message (Signed)
Important Message  Patient Details  Name: Carla [name] MRN: [id number] Date of Birth: [birth year]   Medicare Important Message Given:  Yes     Juliann Pulse A Ariane Ditullio 07/01/2021, 11:40 AM

## 2021-07-01 NOTE — Progress Notes (Signed)
Right femoral central venous catheter removed per MD  Patient tolerated well. Dressing clean dry and intact

## 2021-07-07 DIAGNOSIS — I2510 Atherosclerotic heart disease of native coronary artery without angina pectoris: Secondary | ICD-10-CM | POA: Insufficient documentation

## 2021-07-08 ENCOUNTER — Encounter (INDEPENDENT_AMBULATORY_CARE_PROVIDER_SITE_OTHER): Payer: Self-pay

## 2021-07-08 ENCOUNTER — Other Ambulatory Visit: Payer: Self-pay

## 2021-07-08 ENCOUNTER — Encounter (INDEPENDENT_AMBULATORY_CARE_PROVIDER_SITE_OTHER): Payer: Self-pay | Admitting: Vascular Surgery

## 2021-07-08 ENCOUNTER — Ambulatory Visit (INDEPENDENT_AMBULATORY_CARE_PROVIDER_SITE_OTHER): Payer: Medicare Other | Admitting: Vascular Surgery

## 2021-07-08 ENCOUNTER — Ambulatory Visit (INDEPENDENT_AMBULATORY_CARE_PROVIDER_SITE_OTHER): Payer: Medicare Other | Admitting: Nurse Practitioner

## 2021-07-08 ENCOUNTER — Encounter (INDEPENDENT_AMBULATORY_CARE_PROVIDER_SITE_OTHER): Payer: Medicare Other

## 2021-07-08 VITALS — BP 108/76 | HR 89 | Resp 16 | Wt 222.0 lb

## 2021-07-08 DIAGNOSIS — R7303 Prediabetes: Secondary | ICD-10-CM

## 2021-07-08 DIAGNOSIS — T82868S Thrombosis of vascular prosthetic devices, implants and grafts, sequela: Secondary | ICD-10-CM

## 2021-07-08 DIAGNOSIS — K436 Other and unspecified ventral hernia with obstruction, without gangrene: Secondary | ICD-10-CM

## 2021-07-08 DIAGNOSIS — I998 Other disorder of circulatory system: Secondary | ICD-10-CM

## 2021-07-08 DIAGNOSIS — M79671 Pain in right foot: Secondary | ICD-10-CM

## 2021-07-08 DIAGNOSIS — I10 Essential (primary) hypertension: Secondary | ICD-10-CM | POA: Diagnosis not present

## 2021-07-08 DIAGNOSIS — I739 Peripheral vascular disease, unspecified: Secondary | ICD-10-CM

## 2021-07-08 DIAGNOSIS — E785 Hyperlipidemia, unspecified: Secondary | ICD-10-CM | POA: Diagnosis not present

## 2021-07-08 NOTE — Progress Notes (Signed)
MRN : [id number]  Carla [name] is a 60 y.o. (18-Aug-1961) female who presents with chief complaint of  Chief Complaint  Patient presents with   Follow-up    [facility] 1 wk post le angio intervention severe PAD  .  History of Present Illness: Patient returns today in follow up of her PAD. About a week ago, she presented with limb threatening ischemia of both lower extremities with occlusion of her stents. This occurred while she was off anticoagulation for a planned ventral hernia repair.  Her hernia was not repaired due to her ischemic limbs as well as her concomitant heart attack.  We were able to get her open with revascularization using catheter directed thrombolytic therapy and endovascular techniques.  Given her recent MI and huge ventral hernia, she is not a good candidate for an aortobifemoral bypass.  She still having a lot of pain in her right foot.  No open wounds or infection.  The foot is warm.  Current Outpatient Medications  Medication Sig Dispense Refill   albuterol (VENTOLIN HFA) 108 (90 Base) MCG/ACT inhaler Inhale 2 puffs into the lungs every 6 (six) hours as needed for wheezing or shortness of breath.     Alpha-Lipoic Acid 600 MG CAPS Take 1 capsule (600 mg total) by mouth daily. 30 capsule 2   ALPRAZolam (XANAX) 0.5 MG tablet Take 1 tablet (0.5 mg total) by mouth 2 (two) times daily as needed for anxiety. 8 tablet 0   apixaban (ELIQUIS) 5 MG TABS tablet Take 1 tablet (5 mg total) by mouth 2 (two) times daily. 60 tablet 11   aspirin EC 81 MG tablet Take 81 mg by mouth daily. Swallow whole.     atorvastatin (LIPITOR) 80 MG tablet Take 80 mg by mouth at bedtime.     COMBIVENT RESPIMAT 20-100 MCG/ACT AERS respimat Inhale 1 puff into the lungs daily as needed for wheezing or shortness of breath.     ferrous sulfate 325 (65 FE) MG tablet Take 325 mg by mouth daily.     HYDROcodone-acetaminophen (NORCO/VICODIN) 5-325 MG tablet Take 1 tablet by mouth every 8 (eight) hours as needed for  moderate pain. 20 tablet 0   isosorbide mononitrate (IMDUR) 60 MG 24 hr tablet Take 1 tablet (60 mg total) by mouth daily. 30 tablet 0   metoprolol tartrate (LOPRESSOR) 50 MG tablet Take 1 tablet (50 mg total) by mouth 2 (two) times daily. 30 tablet 0   nitroGLYCERIN (NITROSTAT) 0.4 MG SL tablet Place 0.4 mg under the tongue as directed.     omeprazole (PRILOSEC) 20 MG capsule Take 20 mg by mouth 2 (two) times daily before a meal.     pregabalin (LYRICA) 100 MG capsule Take 100 mg by mouth 3 (three) times daily. (Patient not taking: No sig reported)     No current facility-administered medications for this visit.    Past Medical History:  Diagnosis Date   Abdominal aortic atherosclerosis ([place])    a. 05/2017 CTA abd/pelvis: significant atherosclerotic dzs of the infrarenal abd Ao w/ some mural thrombus. No aneurysm or dissection.   Acute focal ischemia of small intestine (HCC)    Acute right-sided low back pain with right-sided sciatica 06/13/2017   AKI (acute kidney injury) ([place]) 07/29/2017   Anginal pain ([place])    a. 08/2012 Lexiscan MV: EF 54%, non ischemia/infarct.   Anxiety and depression    Baker's cyst of knee, right    a. 07/2016 U/S: 4.1 x 1.4 x 2.9  cystic structure in R poplitetal fossa.   Bell's palsy    Borderline diabetes    CHF (congestive heart failure) (HCC)    Chronic anticoagulation    apixaban   Chronic idiopathic constipation    Chronic mesenteric ischemia (HCC)    Chronic pain    COPD (chronic obstructive pulmonary disease) (HCC)    Diastolic dysfunction    a. 05/2017 Echo: Ef 60-65%, no rwma, Gr1 DD, no source of cardiac emboli.   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 05/2017 CTA Abd/pelvis: apparent thrombus or embolus in prox SMA (70-90%); b. 05/2017 catheter directed tPA, mechanical thrombectomy, and stenting of the SMA.   Essential hypertension with goal blood pressure less than 140/90 01/19/2016   Gastroesophageal reflux disease  01/19/2016   GERD (gastroesophageal reflux disease)    H/O colectomy    History of 2019 novel coronavirus disease (COVID-19) 11/14/2019   History of kidney stones    Hyperlipidemia    Hypertension    Morbid obesity with BMI of 40.0-44.9, adult ([place])    Occlusive mesenteric ischemia ([place]) 06/19/2017   Osteoarthritis    PAD (peripheral artery disease) (HCC)    PAF (paroxysmal atrial fibrillation) (HCC)    Palpitations    Peripheral edema    Personal history of other malignant neoplasm of skin 06/01/2016   Pneumonia    Pre-diabetes    Primary osteoarthritis of both knees 06/13/2017   SBO (small bowel obstruction) ([place]) 08/06/2017   Skin cancer of nose    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HCC)    Vitamin D deficiency     Past Surgical History:  Procedure Laterality Date   AMPUTATION TOE Left 12/09/2019   Procedure: AMPUTATION TOE MPJ left;  Surgeon: Caroline More, DPM;  Location: [facility];  Service: Podiatry;  Laterality: Left;   APPENDECTOMY     CHOLECYSTECTOMY     COLON SURGERY     EMBOLECTOMY OR THROMBECTOMY, WITH OR WITHOUT CATHETER; FEMOROPOPLITEAL, AORTOILIAC ARTERY, BY LEG INCISION N/A 06/21/2020   Left - Decomp Fasciotomy Leg; Ant &/Or Lat Compart Only; Midline - Angiography, Visceral, Selective Or Supraselective (With Or Without Flush Aortogram); Left - Revascularize, Endovasc, Open/Percut, Iliac Artery, Unilat, Initial Vessel; W/Translum Stent, W/Angioplasty; Right - Revascularize, Endovasc, Open/Percut, Iliac Artery, Ea Add`L Ipsilateral; W/Translumin Stent, W/Angioplasty; Location [facility];   LAPAROTOMY N/A 08/08/2017   Procedure: EXPLORATORY LAPAROTOMY POSSIBLE BOWEL RESECTION;  Surgeon: Jules Husbands, MD;  Location: [facility];  Service: General;  Laterality: N/A;   LEFT HEART CATH AND CORONARY ANGIOGRAPHY N/A 05/17/2021   Procedure: LEFT HEART CATH AND CORONARY ANGIOGRAPHY;  Surgeon: Yolonda Kida, MD;  Location: [place] [facility];  Service: Cardiovascular;   Laterality: N/A;   LOWER EXTREMITY ANGIOGRAPHY Left 11/17/2019   Procedure: LOWER EXTREMITY ANGIOGRAPHY;  Surgeon: Algernon Huxley, MD;  Location: [place] [facility];  Service: Cardiovascular;  Laterality: Left;   LOWER EXTREMITY ANGIOGRAPHY Right 12/13/2020   Procedure: LOWER EXTREMITY ANGIOGRAPHY;  Surgeon: Algernon Huxley, MD;  Location: [place] [facility];  Service: Cardiovascular;  Laterality: Right;   LOWER EXTREMITY ANGIOGRAPHY Right 01/10/2021   Procedure: LOWER EXTREMITY ANGIOGRAPHY;  Surgeon: Algernon Huxley, MD;  Location: [place] [facility];  Service: Cardiovascular;  Laterality: Right;   LOWER EXTREMITY ANGIOGRAPHY Right 01/24/2021   Procedure: LOWER EXTREMITY ANGIOGRAPHY;  Surgeon: Algernon Huxley, MD;  Location: [place] [facility];  Service: Cardiovascular;  Laterality: Right;   LOWER EXTREMITY ANGIOGRAPHY Bilateral 06/28/2021  Procedure: Lower Extremity Angiography;  Surgeon: Algernon Huxley, MD;  Location: [place] [facility];  Service: Cardiovascular;  Laterality: Bilateral;   LOWER EXTREMITY INTERVENTION N/A 11/19/2019   Procedure: LOWER EXTREMITY INTERVENTION;  Surgeon: Algernon Huxley, MD;  Location: [place] [facility];  Service: Cardiovascular;  Laterality: N/A;   LOWER EXTREMITY INTERVENTION Bilateral 06/29/2021   Procedure: LOWER EXTREMITY INTERVENTION;  Surgeon: Algernon Huxley, MD;  Location: [place] [facility];  Service: Cardiovascular;  Laterality: Bilateral;   TEE WITHOUT CARDIOVERSION N/A 06/22/2017   Procedure: TRANSESOPHAGEAL ECHOCARDIOGRAM (TEE);  Surgeon: Wellington Hampshire, MD;  Location: [facility];  Service: Cardiovascular;  Laterality: N/A;   TOTAL KNEE ARTHROPLASTY Right 07/11/2018   Procedure: TOTAL KNEE ARTHROPLASTY;  Surgeon: Corky Mull, MD;  Location: [facility];  Service: Orthopedics;  Laterality: Right;   VAGINAL HYSTERECTOMY     VISCERAL ARTERY INTERVENTION N/A 06/20/2017   Procedure: Visceral Artery Intervention, possible aortic thrombectomy;  Surgeon: Algernon Huxley, MD;  Location: [place] [facility];  Service: Cardiovascular;  Laterality: N/A;   VISCERAL ARTERY INTERVENTION N/A 01/28/2018   Procedure: VISCERAL ARTERY INTERVENTION;  Surgeon: Algernon Huxley, MD;  Location: [place] [facility];  Service: Cardiovascular;  Laterality: N/A;     Social History   Tobacco Use   Smoking status: Former    Packs/day: 0.10    Types: Cigarettes   Smokeless tobacco: Never  Vaping Use   Vaping Use: Never used  Substance Use Topics   Alcohol use: No   Drug use: No      Family History  Problem Relation Age of Onset   Hypertension Mother    Heart disease Mother    Heart attack Mother    Breast cancer Mother    Hypertension Father    Breast cancer Paternal Aunt      Allergies  Allergen Reactions   Gabapentin     Tremors    Bactrim [Sulfamethoxazole-Trimethoprim] Itching    REVIEW OF SYSTEMS (Negative unless checked)   Constitutional: '[]'$ Weight loss  '[]'$ Fever  '[]'$ Chills Cardiac: '[]'$ Chest pain   '[]'$ Chest pressure   '[]'$ Palpitations   '[]'$ Shortness of breath when laying flat   '[]'$ Shortness of breath at rest   '[]'$ Shortness of breath with exertion. Vascular:  '[]'$ Pain in legs with walking   '[]'$ Pain in legs at rest   '[]'$ Pain in legs when laying flat   '[]'$ Claudication   '[x]'$ Pain in feet when walking  '[x]'$ Pain in feet at rest  '[x]'$ Pain in feet when laying flat   '[]'$ History of DVT   '[]'$ Phlebitis   '[]'$ Swelling in legs   '[]'$ Varicose veins   '[]'$ Non-healing ulcers Pulmonary:   '[]'$ Uses home oxygen   '[]'$ Productive cough   '[]'$ Hemoptysis   '[]'$ Wheeze  '[]'$ COPD   '[]'$ Asthma Neurologic:  '[]'$ Dizziness  '[]'$ Blackouts   '[]'$ Seizures   '[]'$ History of stroke   '[]'$ History of TIA  '[]'$ Aphasia   '[]'$ Temporary blindness   '[]'$ Dysphagia   '[]'$ Weakness or numbness in arms   '[x]'$ Weakness or numbness in legs Musculoskeletal:  '[]'$ Arthritis   '[]'$ Joint swelling   '[]'$ Joint pain   '[]'$ Low back pain Hematologic:  '[]'$ Easy bruising  '[]'$ Easy bleeding   '[x]'$ Hypercoagulable state   '[]'$ Anemic   Gastrointestinal:  '[]'$ Blood in stool    '[]'$ Vomiting blood  '[]'$ Gastroesophageal reflux/heartburn   '[]'$ Abdominal pain Genitourinary:  '[]'$ Chronic kidney disease   '[]'$ Difficult urination  '[]'$ Frequent urination  '[]'$ Burning with urination   '[]'$ Hematuria Skin:  '[]'$ Rashes   '[]'$ Ulcers   '[]'$ Wounds Psychological:  '[]'$ History of anxiety   '[]'$   History of major depression.  Physical Examination  BP 108/76 (BP Location: Right Arm)   Pulse 89   Resp 16   Wt 222 lb (100.7 kg)   BMI 40.60 kg/m  Gen:  WD/WN, NAD Head: [place]/AT, No temporalis wasting. Ear/Nose/Throat: Hearing grossly intact, nares w/o erythema or drainage Eyes: Conjunctiva clear. Sclera non-icteric Neck: Supple.  Trachea midline Pulmonary:  Good air movement, no use of accessory muscles.  Cardiac: RRR, no JVD Vascular:  Vessel Right Left  Radial Palpable Palpable                          PT Palpable Palpable  DP Palpable Palpable   Gastrointestinal: Huge ventral hernia. Musculoskeletal: M/S 5/5 throughout.  No deformity or atrophy. Trace LE edema. Neurologic: Sensation grossly intact in extremities.  Symmetrical.  Speech is fluent.  Psychiatric: Judgment intact, Mood & affect appropriate for pt's clinical situation. Dermatologic: No rashes or ulcers noted.  No cellulitis or open wounds.      Labs Recent Results (from the past 2160 hour(s))  Brain natriuretic peptide     Status: None   Collection Time: 04/20/21 10:35 AM  Result Value Ref Range   B Natriuretic Peptide 45.4 0.0 - 100.0 pg/mL    Comment: Performed at [facility], [address] [place], [place] [address]  SARS CORONAVIRUS 2 (TAT 6-24 HRS) Nasopharyngeal Nasopharyngeal Swab     Status: None   Collection Time: 06/24/21  9:15 AM   Specimen: Nasopharyngeal Swab  Result Value Ref Range   SARS Coronavirus 2 NEGATIVE NEGATIVE    Comment: (NOTE) SARS-CoV-2 target nucleic acids are NOT DETECTED.  The SARS-CoV-2 RNA is generally detectable in upper and lower respiratory specimens during the acute phase  of infection. Negative results do not preclude SARS-CoV-2 infection, do not rule out co-infections with other pathogens, and should not be used as the sole basis for treatment or other patient management decisions. Negative results must be combined with clinical observations, patient history, and epidemiological information. The expected result is Negative.  Fact Sheet for Patients: SugarRoll.be  Fact Sheet for Healthcare Providers: https://www.woods-mathews.com/  This test is not yet approved or cleared by the [place] FDA and  has been authorized for detection and/or diagnosis of SARS-CoV-2 by FDA under an Emergency Use Authorization (EUA). This EUA will remain  in effect (meaning this test can be used) for the duration of the COVID-19 declaration under Se ction 564(b)(1) of the Act, 21 U.S.C. section 360bbb-3(b)(1), unless the authorization is terminated or revoked sooner.  Performed at [facility], [place] [address], [place], [place] [address]   CBC with Differential     Status: Abnormal   Collection Time: 06/26/21 10:48 PM  Result Value Ref Range   WBC 11.0 (H) 4.0 - 10.5 K/uL   RBC 4.32 3.87 - 5.11 MIL/uL   Hemoglobin 13.1 12.0 - 15.0 g/dL   HCT 39.7 36.0 - 46.0 %   MCV 91.9 80.0 - 100.0 fL   MCH 30.3 26.0 - 34.0 pg   MCHC 33.0 30.0 - 36.0 g/dL   RDW 16.0 (H) 11.5 - 15.5 %   Platelets 200 150 - 400 K/uL   nRBC 0.0 0.0 - 0.2 %   Neutrophils Relative % 70 %   Neutro Abs 7.7 1.7 - 7.7 K/uL   Lymphocytes Relative 22 %   Lymphs Abs 2.4 0.7 - 4.0 K/uL   Monocytes Relative 6 %   Monocytes  Absolute 0.7 0.1 - 1.0 K/uL   Eosinophils Relative 1 %   Eosinophils Absolute 0.1 0.0 - 0.5 K/uL   Basophils Relative 0 %   Basophils Absolute 0.0 0.0 - 0.1 K/uL   Immature Granulocytes 1 %   Abs Immature Granulocytes 0.10 (H) 0.00 - 0.07 K/uL    Comment: Performed at [facility], [address], [place], [place] [id number]   Basic metabolic panel     Status: Abnormal   Collection Time: 06/26/21 10:48 PM  Result Value Ref Range   Sodium 140 135 - 145 mmol/L   Potassium 3.8 3.5 - 5.1 mmol/L   Chloride 107 98 - 111 mmol/L   CO2 24 22 - 32 mmol/L   Glucose, Bld 114 (H) 70 - 99 mg/dL    Comment: Glucose reference range applies only to samples taken after fasting for at least 8 hours.   BUN 21 8 - 23 mg/dL   Creatinine, Ser 0.95 0.44 - 1.00 mg/dL   Calcium 8.4 (L) 8.9 - 10.3 mg/dL   GFR, Estimated >60 >60 mL/min    Comment: (NOTE) Calculated using the CKD-EPI Creatinine Equation (2021)    Anion gap 9 5 - 15    Comment: Performed at [facility], [place], [place] [address]  Heparin level (unfractionated)     Status: Abnormal   Collection Time: 06/27/21  2:36 AM  Result Value Ref Range   Heparin Unfractionated <0.10 (L) 0.30 - 0.70 IU/mL    Comment: (NOTE) The clinical reportable range upper limit is being lowered to >1.10 to align with the FDA approved guidance for the current laboratory assay.  If heparin results are below expected values, and patient dosage has  been confirmed, suggest follow up testing of antithrombin III levels. Performed at [facility], [address] [place], [place] [address]   APTT     Status: None   Collection Time: 06/27/21  2:36 AM  Result Value Ref Range   aPTT 25 24 - 36 seconds    Comment: Performed at [facility], [address] [place], [place] [address]  Protime-INR     Status: None   Collection Time: 06/27/21  2:36 AM  Result Value Ref Range   Prothrombin Time 12.3 11.4 - 15.2 seconds   INR 0.9 0.8 - 1.2    Comment: Performed at [facility], [address], [place], [place] [address]  Resp Panel by RT-PCR (Flu A&B, Covid) Nasopharyngeal Swab     Status: None   Collection Time: 06/27/21  3:35 AM   Specimen: Nasopharyngeal Swab; Nasopharyngeal(NP) swabs in vial transport medium  Result Value Ref Range    SARS Coronavirus 2 by RT PCR NEGATIVE NEGATIVE    Comment: (NOTE) SARS-CoV-2 target nucleic acids are NOT DETECTED.  The SARS-CoV-2 RNA is generally detectable in upper respiratory specimens during the acute phase of infection. The lowest concentration of SARS-CoV-2 viral copies this assay can detect is 138 copies/mL. A negative result does not preclude SARS-Cov-2 infection and should not be used as the sole basis for treatment or other patient management decisions. A negative result may occur with  improper specimen collection/handling, submission of specimen other than nasopharyngeal swab, presence of viral mutation(s) within the areas targeted by this assay, and inadequate number of viral copies(<138 copies/mL). A negative result must be combined with clinical observations, patient history, and epidemiological information. The expected result is Negative.  Fact Sheet for Patients:  EntrepreneurPulse.com.au  Fact Sheet  for Healthcare Providers:  IncredibleEmployment.be  This test is no t yet approved or cleared by the [place] and  has been authorized for detection and/or diagnosis of SARS-CoV-2 by FDA under an Emergency Use Authorization (EUA). This EUA will remain  in effect (meaning this test can be used) for the duration of the COVID-19 declaration under Section 564(b)(1) of the Act, 21 U.S.C.section 360bbb-3(b)(1), unless the authorization is terminated  or revoked sooner.       Influenza A by PCR NEGATIVE NEGATIVE   Influenza B by PCR NEGATIVE NEGATIVE    Comment: (NOTE) The Xpert Xpress SARS-CoV-2/FLU/RSV plus assay is intended as an aid in the diagnosis of influenza from Nasopharyngeal swab specimens and should not be used as a sole basis for treatment. Nasal washings and aspirates are unacceptable for Xpert Xpress SARS-CoV-2/FLU/RSV testing.  Fact Sheet for Patients: EntrepreneurPulse.com.au  Fact  Sheet for Healthcare Providers: IncredibleEmployment.be  This test is not yet approved or cleared by the [place] FDA and has been authorized for detection and/or diagnosis of SARS-CoV-2 by FDA under an Emergency Use Authorization (EUA). This EUA will remain in effect (meaning this test can be used) for the duration of the COVID-19 declaration under Section 564(b)(1) of the Act, 21 U.S.C. section 360bbb-3(b)(1), unless the authorization is terminated or revoked.  Performed at [facility], [address] [place], [place] [id number]   Basic metabolic panel     Status: Abnormal   Collection Time: 06/27/21  5:57 AM  Result Value Ref Range   Sodium 138 135 - 145 mmol/L   Potassium 4.1 3.5 - 5.1 mmol/L   Chloride 108 98 - 111 mmol/L   CO2 25 22 - 32 mmol/L   Glucose, Bld 99 70 - 99 mg/dL    Comment: Glucose reference range applies only to samples taken after fasting for at least 8 hours.   BUN 21 8 - 23 mg/dL   Creatinine, Ser 0.81 0.44 - 1.00 mg/dL   Calcium 8.2 (L) 8.9 - 10.3 mg/dL   GFR, Estimated >60 >60 mL/min    Comment: (NOTE) Calculated using the CKD-EPI Creatinine Equation (2021)    Anion gap 5 5 - 15    Comment: Performed at [facility], [address] [place], [place] [address]  CBC     Status: Abnormal   Collection Time: 06/27/21  5:57 AM  Result Value Ref Range   WBC 9.5 4.0 - 10.5 K/uL   RBC 4.22 3.87 - 5.11 MIL/uL   Hemoglobin 12.5 12.0 - 15.0 g/dL   HCT 38.3 36.0 - 46.0 %   MCV 90.8 80.0 - 100.0 fL   MCH 29.6 26.0 - 34.0 pg   MCHC 32.6 30.0 - 36.0 g/dL   RDW 16.3 (H) 11.5 - 15.5 %   Platelets 207 150 - 400 K/uL   nRBC 0.0 0.0 - 0.2 %    Comment: Performed at [facility], [place], [place] [address]  Heparin level (unfractionated)     Status: Abnormal   Collection Time: 06/27/21  8:56 AM  Result Value Ref Range   Heparin Unfractionated 0.72 (H) 0.30 - 0.70 IU/mL    Comment: (NOTE) The  clinical reportable range upper limit is being lowered to >1.10 to align with the FDA approved guidance for the current laboratory assay.  If heparin results are below expected values, and patient dosage has  been confirmed, suggest follow up testing of antithrombin III levels. Performed at [facility], [place], [place] [address]   APTT     Status: Abnormal   Collection Time: 06/27/21  8:56 AM  Result Value Ref Range   aPTT 65 (H) 24 - 36 seconds    Comment:        IF BASELINE aPTT IS ELEVATED, SUGGEST PATIENT RISK ASSESSMENT BE USED TO DETERMINE APPROPRIATE ANTICOAGULANT THERAPY. Performed at [facility], [address] [place], [place] [address]   APTT     Status: Abnormal   Collection Time: 06/27/21  5:54 PM  Result Value Ref Range   aPTT 81 (H) 24 - 36 seconds    Comment:        IF BASELINE aPTT IS ELEVATED, SUGGEST PATIENT RISK ASSESSMENT BE USED TO DETERMINE APPROPRIATE ANTICOAGULANT THERAPY. Performed at [facility], [address] [place], [place] [address]   Troponin I (High Sensitivity)     Status: Abnormal   Collection Time: 06/27/21  5:54 PM  Result Value Ref Range   Troponin I (High Sensitivity) 893 (HH) <18 ng/L    Comment: CRITICAL RESULT CALLED TO, READ BACK BY AND VERIFIED WITH KIM Domingo Cocking AT 1845 ON 06/27/21 BY SS (NOTE) Elevated high sensitivity troponin I (hsTnI) values and significant  changes across serial measurements may suggest ACS but many other  chronic and acute conditions are known to elevate hsTnI results.  Refer to the "Links" section for chest pain algorithms and additional  guidance. Performed at [facility], [place], [place] [address]   Troponin I (High Sensitivity)     Status: Abnormal   Collection Time: 06/27/21  7:48 PM  Result Value Ref Range   Troponin I (High Sensitivity) 1,587 (HH) <18 ng/L    Comment: CRITICAL VALUE NOTED. VALUE IS CONSISTENT WITH  PREVIOUSLY REPORTED/CALLED VALUE MJU (NOTE) Elevated high sensitivity troponin I (hsTnI) values and significant  changes across serial measurements may suggest ACS but many other  chronic and acute conditions are known to elevate hsTnI results.  Refer to the "Links" section for chest pain algorithms and additional  guidance. Performed at [facility], [address] [place], [place] [address]   APTT     Status: Abnormal   Collection Time: 06/27/21 10:46 PM  Result Value Ref Range   aPTT 72 (H) 24 - 36 seconds    Comment:        IF BASELINE aPTT IS ELEVATED, SUGGEST PATIENT RISK ASSESSMENT BE USED TO DETERMINE APPROPRIATE ANTICOAGULANT THERAPY. Performed at [facility], [place],  [address]   Troponin I (High Sensitivity)     Status: Abnormal   Collection Time: 06/27/21 10:46 PM  Result Value Ref Range   Troponin I (High Sensitivity) 1,748 (HH) <18 ng/L    Comment: CRITICAL VALUE NOTED. VALUE IS CONSISTENT WITH PREVIOUSLY REPORTED/CALLED VALUE RH (NOTE) Elevated high sensitivity troponin I (hsTnI) values and significant  changes across serial measurements may suggest ACS but many other  chronic and acute conditions are known to elevate hsTnI results.  Refer to the "Links" section for chest pain algorithms and additional  guidance. Performed at [facility], [address] [place],  [id number]   Basic metabolic panel     Status: Abnormal   Collection Time: 06/28/21  4:26 AM  Result Value Ref Range   Sodium 138 135 - 145 mmol/L   Potassium 4.1 3.5 - 5.1 mmol/L   Chloride 106 98 - 111 mmol/L   CO2 26  22 - 32 mmol/L   Glucose, Bld 118 (H) 70 - 99 mg/dL    Comment: Glucose reference range applies only to samples taken after fasting for at least 8 hours.   BUN 18 8 - 23 mg/dL   Creatinine, Ser 0.63 0.44 - 1.00 mg/dL   Calcium 8.6 (L) 8.9 - 10.3 mg/dL   GFR, Estimated >60 >60 mL/min    Comment: (NOTE) Calculated  using the CKD-EPI Creatinine Equation (2021)    Anion gap 6 5 - 15    Comment: Performed at [facility], [address] [place], [place] [address]  CBC     Status: Abnormal   Collection Time: 06/28/21  4:26 AM  Result Value Ref Range   WBC 8.3 4.0 - 10.5 K/uL   RBC 4.08 3.87 - 5.11 MIL/uL   Hemoglobin 12.5 12.0 - 15.0 g/dL   HCT 37.0 36.0 - 46.0 %   MCV 90.7 80.0 - 100.0 fL   MCH 30.6 26.0 - 34.0 pg   MCHC 33.8 30.0 - 36.0 g/dL   RDW 15.9 (H) 11.5 - 15.5 %   Platelets 183 150 - 400 K/uL   nRBC 0.0 0.0 - 0.2 %    Comment: Performed at [facility], [place], [place] [address]  Troponin I (High Sensitivity)     Status: Abnormal   Collection Time: 06/28/21  4:26 AM  Result Value Ref Range   Troponin I (High Sensitivity) 868 (HH) <18 ng/L    Comment: CRITICAL VALUE NOTED. VALUE IS CONSISTENT WITH PREVIOUSLY REPORTED/CALLED VALUE RH (NOTE) Elevated high sensitivity troponin I (hsTnI) values and significant  changes across serial measurements may suggest ACS but many other  chronic and acute conditions are known to elevate hsTnI results.  Refer to the "Links" section for chest pain algorithms and additional  guidance. Performed at [facility], [place], [place] [address]   Heparin level (unfractionated)     Status: Abnormal   Collection Time: 06/28/21  4:26 AM  Result Value Ref Range   Heparin Unfractionated 0.72 (H) 0.30 - 0.70 IU/mL    Comment: (NOTE) The clinical reportable range upper limit is being lowered to >1.10 to align with the FDA approved guidance for the current laboratory assay.  If heparin results are below expected values, and patient dosage has  been confirmed, suggest follow up testing of antithrombin III levels. Performed at [facility], [address] [place], [place] [address]   APTT     Status: Abnormal   Collection Time: 06/28/21  4:26 AM  Result Value Ref Range   aPTT 82 (H)  24 - 36 seconds    Comment:        IF BASELINE aPTT IS ELEVATED, SUGGEST PATIENT RISK ASSESSMENT BE USED TO DETERMINE APPROPRIATE ANTICOAGULANT THERAPY. Performed at [facility], [place], [place] [address]   Troponin I (High Sensitivity)     Status: Abnormal   Collection Time: 06/28/21  6:00 AM  Result Value Ref Range   Troponin I (High Sensitivity) 735 (HH) <18 ng/L    Comment: CRITICAL VALUE NOTED. VALUE IS CONSISTENT WITH PREVIOUSLY REPORTED/CALLED VALUE   SB (NOTE) Elevated high sensitivity troponin I (hsTnI) values and significant  changes across serial measurements may suggest ACS but many other  chronic and acute conditions are known to elevate hsTnI results.  Refer to the "Links" section for chest pain algorithms and additional  guidance. Performed at [facility],  [place], [place] [address]   Heparin level (unfractionated)     Status: None   Collection Time: 06/28/21 11:17 AM  Result Value Ref Range   Heparin Unfractionated 0.57 0.30 - 0.70 IU/mL    Comment: (NOTE) The clinical reportable range upper limit is being lowered to >1.10 to align with the FDA approved guidance for the current laboratory assay.  If heparin results are below expected values, and patient dosage has  been confirmed, suggest follow up testing of antithrombin III levels. Performed at [facility], [address] [place], [place] [address]   CBC every 6 hours x 4 post-procedure     Status: Abnormal   Collection Time: 06/28/21  6:03 PM  Result Value Ref Range   WBC 10.1 4.0 - 10.5 K/uL   RBC 4.21 3.87 - 5.11 MIL/uL   Hemoglobin 12.7 12.0 - 15.0 g/dL   HCT 38.3 36.0 - 46.0 %   MCV 91.0 80.0 - 100.0 fL   MCH 30.2 26.0 - 34.0 pg   MCHC 33.2 30.0 - 36.0 g/dL   RDW 16.2 (H) 11.5 - 15.5 %   Platelets 191 150 - 400 K/uL   nRBC 0.0 0.0 - 0.2 %    Comment: Performed at [facility], [address] [place], [place] [address]   Glucose, capillary     Status: Abnormal   Collection Time: 06/28/21  6:30 PM  Result Value Ref Range   Glucose-Capillary 102 (H) 70 - 99 mg/dL    Comment: Glucose reference range applies only to samples taken after fasting for at least 8 hours.  MRSA Next Gen by PCR, Nasal     Status: None   Collection Time: 06/28/21  6:31 PM   Specimen: Nasal Mucosa; Nasal Swab  Result Value Ref Range   MRSA by PCR Next Gen NOT DETECTED NOT DETECTED    Comment: (NOTE) The GeneXpert MRSA Assay (FDA approved for NASAL specimens only), is one component of a comprehensive MRSA colonization surveillance program. It is not intended to diagnose MRSA infection nor to guide or monitor treatment for MRSA infections. Test performance is not FDA approved in patients less than 54 years old. Performed at [facility], [address] [place], [place] [address]   Fibrinogen     Status: None   Collection Time: 06/28/21  6:45 PM  Result Value Ref Range   Fibrinogen 440 210 - 475 mg/dL    Comment: (NOTE) Fibrinogen results may be underestimated in patients receiving thrombolytic therapy. Performed at [facility], [address] [place], [place] [address]   CBC every 6 hours x 4 post-procedure     Status: Abnormal   Collection Time: 06/28/21 10:57 PM  Result Value Ref Range   WBC 8.8 4.0 - 10.5 K/uL   RBC 4.27 3.87 - 5.11 MIL/uL   Hemoglobin 12.8 12.0 - 15.0 g/dL   HCT 38.9 36.0 - 46.0 %   MCV 91.1 80.0 - 100.0 fL   MCH 30.0 26.0 - 34.0 pg   MCHC 32.9 30.0 - 36.0 g/dL   RDW 16.0 (H) 11.5 - 15.5 %   Platelets 179 150 - 400 K/uL   nRBC 0.0 0.0 - 0.2 %    Comment: Performed at [facility], [address] [place],  [address]  Fibrinogen every 6 hours x 4 post-procedure     Status: None   Collection Time: 06/28/21 10:57 PM  Result Value Ref Range   Fibrinogen 458  210 - 475 mg/dL    Comment: (NOTE) Fibrinogen results may be underestimated in patients  receiving thrombolytic therapy. Performed at [facility], [address] [place], [place] [id number]   Basic metabolic panel     Status: Abnormal   Collection Time: 06/29/21  5:48 AM  Result Value Ref Range   Sodium 137 135 - 145 mmol/L   Potassium 4.3 3.5 - 5.1 mmol/L   Chloride 101 98 - 111 mmol/L   CO2 27 22 - 32 mmol/L   Glucose, Bld 112 (H) 70 - 99 mg/dL    Comment: Glucose reference range applies only to samples taken after fasting for at least 8 hours.   BUN 13 8 - 23 mg/dL   Creatinine, Ser 0.69 0.44 - 1.00 mg/dL   Calcium 8.6 (L) 8.9 - 10.3 mg/dL   GFR, Estimated >60 >60 mL/min    Comment: (NOTE) Calculated using the CKD-EPI Creatinine Equation (2021)    Anion gap 9 5 - 15    Comment: Performed at [facility], [address] [place], [place] [address]  CBC     Status: Abnormal   Collection Time: 06/29/21  5:48 AM  Result Value Ref Range   WBC 8.4 4.0 - 10.5 K/uL   RBC 4.27 3.87 - 5.11 MIL/uL   Hemoglobin 13.0 12.0 - 15.0 g/dL   HCT 38.7 36.0 - 46.0 %   MCV 90.6 80.0 - 100.0 fL   MCH 30.4 26.0 - 34.0 pg   MCHC 33.6 30.0 - 36.0 g/dL   RDW 15.9 (H) 11.5 - 15.5 %   Platelets 160 150 - 400 K/uL   nRBC 0.0 0.0 - 0.2 %    Comment: Performed at [facility], [address] [place], [place] [address]  Fibrinogen every 6 hours x 4 post-procedure     Status: None   Collection Time: 06/29/21  5:48 AM  Result Value Ref Range   Fibrinogen 332 210 - 475 mg/dL    Comment: (NOTE) Fibrinogen results may be underestimated in patients receiving thrombolytic therapy. Performed at [facility], [address] [place], [place] [address]   APTT     Status: Abnormal   Collection Time: 06/29/21 10:06 PM  Result Value Ref Range   aPTT 39 (H) 24 - 36 seconds    Comment:        IF BASELINE aPTT IS ELEVATED, SUGGEST PATIENT RISK ASSESSMENT BE USED TO DETERMINE APPROPRIATE ANTICOAGULANT THERAPY. Performed at [facility], [address] [place],  [address]   CBC     Status: Abnormal   Collection Time: 06/30/21  4:40 AM  Result Value Ref Range   WBC 8.8 4.0 - 10.5 K/uL   RBC 4.11 3.87 - 5.11 MIL/uL   Hemoglobin 12.5 12.0 - 15.0 g/dL   HCT 37.0 36.0 - 46.0 %   MCV 90.0 80.0 - 100.0 fL   MCH 30.4 26.0 - 34.0 pg   MCHC 33.8 30.0 - 36.0 g/dL   RDW 15.7 (H) 11.5 - 15.5 %   Platelets 164 150 - 400 K/uL   nRBC 0.0 0.0 - 0.2 %    Comment: Performed at [facility], [place], [place] [address]  Heparin level (unfractionated)     Status: Abnormal   Collection Time: 06/30/21  4:40 AM  Result Value Ref Range   Heparin Unfractionated 0.27 (L) 0.30 - 0.70 IU/mL    Comment: (NOTE) The clinical reportable range upper limit  is being lowered to >1.10 to align with the FDA approved guidance for the current laboratory assay.  If heparin results are below expected values, and patient dosage has  been confirmed, suggest follow up testing of antithrombin III levels. Performed at [facility], [address] [place], [place] [address]   APTT     Status: Abnormal   Collection Time: 06/30/21  4:40 AM  Result Value Ref Range   aPTT 51 (H) 24 - 36 seconds    Comment:        IF BASELINE aPTT IS ELEVATED, SUGGEST PATIENT RISK ASSESSMENT BE USED TO DETERMINE APPROPRIATE ANTICOAGULANT THERAPY. Performed at [facility], [address] [place], [place] [address]     Radiology CT ANGIO Delrae Sawyers & OR WO CONTRAST  Result Date: 06/27/2021 CLINICAL DATA:  Right leg pain, cold right foot EXAM: CT ANGIOGRAPHY AOBIFEM WITHOUT AND WITH CONTRAST TECHNIQUE: 100 cc Omnipaque 350 CONTRAST:  119m OMNIPAQUE IOHEXOL 350 MG/ML SOLN COMPARISON:  None. FINDINGS: Abdomen pelvis: The abdominal aorta is of normal caliber. No aneurysm or dissection. Mild atherosclerotic calcification. Bilateral iliac artery stenting has been performed with a proximal landing zone of the kissing stents within  the distal aorta. At this level, the aorta is occluded, as are the common iliac artery stents. No periaortic inflammatory stranding. The celiac axis demonstrates normal anatomic configuration and is widely patent. Stenting of the proximal superior mesenteric artery has been performed with thrombosis of the stent and reconstitution of the mid superior mesenteric artery via the inferior pancreaticoduodenal arcade and arc of Riolan. The inferior mesenteric artery is patent and hypertrophied, though demonstrates a 50% stenosis at its origin. Single right and dual left renal arteries are widely patent and demonstrate normal vascular morphology. No aneurysm. Right lower extremity: Right common and external iliac artery stenting has been performed. The right common iliac artery is thrombosed with reconstitution of the external iliac artery via the internal iliac artery. Right lower extremity arterial outflow is widely patent. Limited evaluation of the popliteal artery secondary to streak artifact from right total knee arthroplasty. Occlusion of the anterior tibial artery proximally. Occlusion of the a posterior tibial artery just distal to the tibioperoneal trunk. Occlusion of the peroneal artery at the level of the a mid diaphysis of the tibia. No definite distal reconstitution. Left lower extremity: Left common iliac artery and external iliac artery stenting has been performed. Common iliac artery is occluded. Reconstitution of the external iliac artery via the internal iliac artery. Left lower extremity arterial outflow is widely patent. Left popliteal artery and tibioperoneal trunk are widely patent. Anterior tibial artery is occluded at its origin. Posterior tibial artery occludes at the level of the tibial plafond but is subsequently reconstituted at the level of the plantar arch. Peroneal artery continues to supply the lateral calcaneal artery. Dorsalis pedis artery is occluded. Visualized lung bases are clear.  Visualized heart and pericardium are unremarkable. Status post cholecystectomy. Moderate intra and extrahepatic biliary ductal dilation is stable from multiple prior examinations and likely represents post cholecystectomy change. Liver otherwise unremarkable. Pancreas, spleen, right adrenal gland, and kidneys are unremarkable. Nodular thickening of the left adrenal gland is unchanged. Large broad-based ventral hernia containing multiple loops of large and small bowel is unchanged. Surgical anastomotic staple line noted within the distal small bowel. The bowel is otherwise unremarkable. No free intraperitoneal gas or fluid. Uterus absent. No adnexal masses. Bladder unremarkable. Rectum unremarkable. No pathologic adenopathy within the abdomen and pelvis. Review of  the MIP images confirms the above findings. 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 50% stenosis of the IMA origin. Thrombosis of bilateral common iliac artery kissing stents with resultant thrombosis of the terminal abdominal aorta below the level of the IMA origin. Reconstitution of the external iliac arteries bilaterally via internal iliac artery collaterals. Bilateral lower extremity runoff disease. Occlusion of all 3 runoff vessels of the right lower extremity without identifiable reconstitution distal to the mid diaphysis of the tibia. Two vessel runoff to the left foot. Aortic Atherosclerosis (ICD10-I70.0). Electronically Signed   By: Fidela Salisbury M.D.   On: 06/27/2021 01:21   PERIPHERAL VASCULAR CATHETERIZATION  Result Date: 06/29/2021 See surgical note for result.  PERIPHERAL VASCULAR CATHETERIZATION  Result Date: 06/28/2021 See surgical note for result.   Assessment/Plan Essential hypertension with goal blood pressure less than 140/90 blood pressure control important in reducing the progression of  atherosclerotic disease. On appropriate oral medications.     Prediabetes blood glucose control important in reducing the progression of atherosclerotic disease. Also, involved in wound healing. On appropriate medications.     Hyperlipidemia lipid control important in reducing the progression of atherosclerotic disease. 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 Would recommend Lovenox bridge with her significant clotting history.  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HCC) Status post major intervention last week to get this back open in both iliac arteries  Ischemic pain of right foot Now status post revascularization but has significant neuropathic pain as well  PAD (peripheral artery disease) ([place]) Perfusion has been restored.  She still has a lot of neuropathic pain.  Recheck with ABIs in about a month.    [name], MD  07/08/2021 12:33 PM    This note was created with Dragon medical transcription system.  Any errors from dictation are purely unintentional

## 2021-07-08 NOTE — Assessment & Plan Note (Signed)
Now status post revascularization but has significant neuropathic pain as well

## 2021-07-08 NOTE — Assessment & Plan Note (Signed)
Perfusion has been restored.  She still has a lot of neuropathic pain.  Recheck with ABIs in about a month.

## 2021-07-08 NOTE — Assessment & Plan Note (Signed)
Status post major intervention last week to get this back open in both iliac arteries

## 2021-07-11 ENCOUNTER — Ambulatory Visit (INDEPENDENT_AMBULATORY_CARE_PROVIDER_SITE_OTHER): Payer: Medicare Other | Admitting: Surgery

## 2021-07-11 ENCOUNTER — Encounter: Payer: Self-pay | Admitting: Surgery

## 2021-07-11 ENCOUNTER — Other Ambulatory Visit: Payer: Self-pay

## 2021-07-11 VITALS — BP 111/75 | HR 73 | Temp 98.4°F | Ht 62.0 in | Wt 222.8 lb

## 2021-07-11 DIAGNOSIS — K432 Incisional hernia without obstruction or gangrene: Secondary | ICD-10-CM

## 2021-07-11 NOTE — Patient Instructions (Addendum)
We will reach out to you in 4 months. If you do not hear from our office please call us.   Ventral Hernia A ventral hernia is a bulge of tissue from inside the abdomen that pushes through a weak area of the muscles that form the front wall of the abdomen. The tissues inside the abdomen are inside a sac (peritoneum). These tissues include the small intestine, large intestine, and the fatty tissue that covers the intestines (omentum). Sometimes, the bulge that forms a hernia contains intestines. Other hernias contain only fat. Ventral hernias do not go away without surgical treatment. There are several types of ventral hernias. You may have: A hernia at an incision site from previous abdominal surgery (incisional hernia). A hernia just above the belly button (epigastric hernia), or at the belly button (umbilical hernia). These types of hernias can develop from heavy lifting or straining. A hernia that comes and goes (reducible hernia). It may be visible only when you lift or strain. This type of hernia can be pushed back into the abdomen (reduced). A hernia that traps abdominal tissue inside the hernia (incarcerated hernia). This type of hernia does not reduce. A hernia that cuts off blood flow to the tissues inside the hernia (strangulated hernia). The tissues can start to die if this happens. This is a very painful bulge that cannot be reduced. A strangulated hernia is a medical emergency. What are the causes? This condition is caused by abdominal tissue putting pressure on an area of weakness in the abdominal muscles. What increases the risk? The following factors may make you more likely to develop this condition: Being age 47 or older. Being overweight or obese. Having had previous abdominal surgery, especially if there was an infection after surgery. Having had an injury to the abdominal wall. Frequently lifting or pushing heavy objects. Having had several pregnancies. Having a buildup of  fluid inside the abdomen (ascites). Straining to have a bowel movement or to urinate. Having frequent coughing episodes. What are the signs or symptoms? The only symptom of a ventral hernia may be a painless bulge in the abdomen. A reducible hernia may be visible only when you strain, cough, or lift. Other symptoms may include: Dull pain. A feeling of pressure. Signs and symptoms of a strangulated hernia may include: Increasing pain. Nausea and vomiting. Pain when pressing on the hernia. The skin over the hernia turning red or purple. Constipation. Blood in the stool (feces). How is this diagnosed? This condition may be diagnosed based on: Your symptoms. Your medical history. A physical exam. You may be asked to cough or strain while standing. These actions increase the pressure inside your abdomen and force the hernia through the opening in your muscles. Your health care provider may try to reduce the hernia by gently pushing the hernia back in. Imaging studies, such as an ultrasound or CT scan. How is this treated? This condition is treated with surgery. If you have a strangulated hernia, surgery is done as soon as possible. If your hernia is small and not incarcerated, you may be asked to lose some weight before surgery. Follow these instructions at home: Follow instructions from your health care provider about eating or drinking restrictions. If you are overweight, your health care provider may recommend that you increase your activity level and eat a healthier diet. Do not lift anything that is heavier than 10 lb (4.5 kg), or the limit that you are told, until your health care provider says that it  is safe. Return to your normal activities as told by your health care provider. Ask your health care provider what activities are safe for you. You may need to avoid activities that increase pressure on your hernia. Take over-the-counter and prescription medicines only as told by your  health care provider. Keep all follow-up visits. This is important. Contact a health care provider if: Your hernia gets larger. Your hernia becomes painful. Get help right away if: Your hernia becomes increasingly painful. You have pain along with any of the following: Changes in skin color in the area of the hernia. Nausea. Vomiting. Fever. These symptoms may represent a serious problem that is an emergency. Do not wait to see if the symptoms will go away. Get medical help right away. Call your local emergency services (911 in the [place] Do not drive yourself to the hospital. Summary A ventral hernia is a bulge of tissue from inside the abdomen that pushes through a weak area of the muscles that form the front wall of the abdomen. This condition is treated with surgery, which may be urgent depending on your hernia. Do not lift anything that is heavier than 10 lb (4.5 kg), and follow activity instructions from your health care provider. This information is not intended to replace advice given to you by your health care provider. Make sure you discuss any questions you have with your health care provider. Document Revised: 05/28/2020 Document Reviewed: 05/28/2020 Elsevier Patient Education  [place].

## 2021-07-11 NOTE — Progress Notes (Signed)
Outpatient Surgical Follow Up  07/11/2021  JOSELIN CRANDELL is an 60 y.o. female.   Chief Complaint  Patient presents with   Follow-up    Ventral hernia    HPI: Clarene Critchley is well-known to me with a symptomatic incisional hernia with loss of domain.  She is having some intermittent abdominal pain but her measures significant pain is from the right foot.  She underwent revascularization by Dr. Lucky Cowboy for inflow disease.  During recent hospitalization she also developed heart and has been followed by cardiology.  No recent chest pain .  No recent changes in chronic incarcerated ventral hernia  Past Medical History:  Diagnosis Date   Abdominal aortic atherosclerosis ([place])    a. 05/2017 CTA abd/pelvis: significant atherosclerotic dzs of the infrarenal abd Ao w/ some mural thrombus. No aneurysm or dissection.   Acute focal ischemia of small intestine (HCC)    Acute right-sided low back pain with right-sided sciatica 06/13/2017   AKI (acute kidney injury) ([place]) 07/29/2017   Anginal pain ([place])    a. 08/2012 Lexiscan MV: EF 54%, non ischemia/infarct.   Anxiety and depression    Baker's cyst of knee, right    a. 07/2016 U/S: 4.1 x 1.4 x 2.9 cystic structure in R poplitetal fossa.   Bell's palsy    Borderline diabetes    CHF (congestive heart failure) (HCC)    Chronic anticoagulation    apixaban   Chronic idiopathic constipation    Chronic mesenteric ischemia (HCC)    Chronic pain    COPD (chronic obstructive pulmonary disease) (HCC)    Diastolic dysfunction    a. 05/2017 Echo: Ef 60-65%, no rwma, Gr1 DD, no source of cardiac emboli.   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 05/2017 CTA Abd/pelvis: apparent thrombus or embolus in prox SMA (70-90%); b. 05/2017 catheter directed tPA, mechanical thrombectomy, and stenting of the SMA.   Essential hypertension with goal blood pressure less than 140/90 01/19/2016   Gastroesophageal reflux disease 01/19/2016   GERD (gastroesophageal  reflux disease)    H/O colectomy    History of 2019 novel coronavirus disease (COVID-19) 11/14/2019   History of kidney stones    Hyperlipidemia    Hypertension    Morbid obesity with BMI of 40.0-44.9, adult ([place])    Occlusive mesenteric ischemia ([place]) 06/19/2017   Osteoarthritis    PAD (peripheral artery disease) (HCC)    PAF (paroxysmal atrial fibrillation) (HCC)    Palpitations    Peripheral edema    Personal history of other malignant neoplasm of skin 06/01/2016   Pneumonia    Pre-diabetes    Primary osteoarthritis of both knees 06/13/2017   SBO (small bowel obstruction) ([place]) 08/06/2017   Skin cancer of nose    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HCC)    Vitamin D deficiency     Past Surgical History:  Procedure Laterality Date   AMPUTATION TOE Left 12/09/2019   Procedure: AMPUTATION TOE MPJ left;  Surgeon: Caroline More, DPM;  Location: [facility];  Service: Podiatry;  Laterality: Left;   APPENDECTOMY     CHOLECYSTECTOMY     COLON SURGERY     EMBOLECTOMY OR THROMBECTOMY, WITH OR WITHOUT CATHETER; FEMOROPOPLITEAL, AORTOILIAC ARTERY, BY LEG INCISION N/A 06/21/2020   Left - Decomp Fasciotomy Leg; Ant &/Or Lat Compart Only; Midline - Angiography, Visceral, Selective Or Supraselective (With Or Without Flush Aortogram); Left - Revascularize, Endovasc, Open/Percut, Iliac Artery, Unilat, Initial Vessel; W/Translum Stent, W/Angioplasty; Right - Revascularize,  Endovasc, Open/Percut, Iliac Artery, Ea Add`L Ipsilateral; W/Translumin Stent, W/Angioplasty; Location [facility];   LAPAROTOMY N/A 08/08/2017   Procedure: EXPLORATORY LAPAROTOMY POSSIBLE BOWEL RESECTION;  Surgeon: Jules Husbands, MD;  Location: [facility];  Service: General;  Laterality: N/A;   LEFT HEART CATH AND CORONARY ANGIOGRAPHY N/A 05/17/2021   Procedure: LEFT HEART CATH AND CORONARY ANGIOGRAPHY;  Surgeon: Yolonda Kida, MD;  Location: [place] [facility];  Service: Cardiovascular;  Laterality: N/A;   LOWER EXTREMITY  ANGIOGRAPHY Left 11/17/2019   Procedure: LOWER EXTREMITY ANGIOGRAPHY;  Surgeon: Algernon Huxley, MD;  Location: [place] [facility];  Service: Cardiovascular;  Laterality: Left;   LOWER EXTREMITY ANGIOGRAPHY Right 12/13/2020   Procedure: LOWER EXTREMITY ANGIOGRAPHY;  Surgeon: Algernon Huxley, MD;  Location: [place] [facility];  Service: Cardiovascular;  Laterality: Right;   LOWER EXTREMITY ANGIOGRAPHY Right 01/10/2021   Procedure: LOWER EXTREMITY ANGIOGRAPHY;  Surgeon: Algernon Huxley, MD;  Location: [place] [facility];  Service: Cardiovascular;  Laterality: Right;   LOWER EXTREMITY ANGIOGRAPHY Right 01/24/2021   Procedure: LOWER EXTREMITY ANGIOGRAPHY;  Surgeon: Algernon Huxley, MD;  Location: [place] [facility];  Service: Cardiovascular;  Laterality: Right;   LOWER EXTREMITY ANGIOGRAPHY Bilateral 06/28/2021   Procedure: Lower Extremity Angiography;  Surgeon: Algernon Huxley, MD;  Location: [place] [facility];  Service: Cardiovascular;  Laterality: Bilateral;   LOWER EXTREMITY INTERVENTION N/A 11/19/2019   Procedure: LOWER EXTREMITY INTERVENTION;  Surgeon: Algernon Huxley, MD;  Location: [place] [facility];  Service: Cardiovascular;  Laterality: N/A;   LOWER EXTREMITY INTERVENTION Bilateral 06/29/2021   Procedure: LOWER EXTREMITY INTERVENTION;  Surgeon: Algernon Huxley, MD;  Location: [place] [facility];  Service: Cardiovascular;  Laterality: Bilateral;   TEE WITHOUT CARDIOVERSION N/A 06/22/2017   Procedure: TRANSESOPHAGEAL ECHOCARDIOGRAM (TEE);  Surgeon: Wellington Hampshire, MD;  Location: [facility];  Service: Cardiovascular;  Laterality: N/A;   TOTAL KNEE ARTHROPLASTY Right 07/11/2018   Procedure: TOTAL KNEE ARTHROPLASTY;  Surgeon: Corky Mull, MD;  Location: [facility];  Service: Orthopedics;  Laterality: Right;   VAGINAL HYSTERECTOMY     VISCERAL ARTERY INTERVENTION N/A 06/20/2017   Procedure: Visceral Artery Intervention, possible aortic thrombectomy;  Surgeon: Algernon Huxley, MD;  Location: [place] [facility];  Service: Cardiovascular;  Laterality: N/A;   VISCERAL ARTERY INTERVENTION N/A 01/28/2018   Procedure: VISCERAL ARTERY INTERVENTION;  Surgeon: Algernon Huxley, MD;  Location: [place] [facility];  Service: Cardiovascular;  Laterality: N/A;    Family History  Problem Relation Age of Onset   Hypertension Mother    Heart disease Mother    Heart attack Mother    Breast cancer Mother    Hypertension Father    Breast cancer Paternal Aunt     Social History:  reports that she has quit smoking. Her smoking use included cigarettes. She smoked an average of .1 packs per day. She has never used smokeless tobacco. She reports that she does not drink alcohol and does not use drugs.  Allergies:  Allergies  Allergen Reactions   Gabapentin     Tremors    Bactrim [Sulfamethoxazole-Trimethoprim] Itching    Medications reviewed.    ROS Full ROS performed and is otherwise negative other than what is stated in HPI   BP 111/75   Pulse 73   Temp 98.4 F (36.9 C) (Oral)   Ht 5\' 2"  (1.575 m)   Wt 222 lb 12.8 oz (101.1 kg)   SpO2 95%   BMI  40.75 kg/m   Physical Exam Vitals and nursing note reviewed. Exam conducted with a chaperone present.  Constitutional:      Appearance: Normal appearance. She is obese.  Pulmonary:     Effort: Pulmonary effort is normal. No respiratory distress.     Breath sounds: No stridor.  Abdominal:     General: Abdomen is flat. There is no distension.     Palpations: Abdomen is soft. There is no mass.     Tenderness: There is no abdominal tenderness. There is no guarding or rebound.     Hernia: A hernia is present.     Comments: Chronically incarcerated ventral hernia with loss of domain.  No peritonitis mild tenderness palpation diffusely  Musculoskeletal:        General: Normal range of motion.  Skin:    General: Skin is warm and dry.     Capillary Refill: Capillary refill takes less than 2 seconds.  Neurological:     General: No focal deficit  present.     Mental Status: She is alert and oriented to person, place, and time.  Psychiatric:        Mood and Affect: Mood normal.        Behavior: Behavior normal.        Thought Content: Thought content normal.        Judgment: Judgment normal.       Assessment/Plan: Margert is a 60 year old female with recurrent incisional hernia repair also meeting.  If he can have severe peripheral vascular disease specifically these.  Status post endovascular intervention.  She also had a recent demand ischemia MRI.  Those reasons I do think that is prudent to postpone her surgery for least 6 months.  I had an extensive discussion with her regarding her disease process.  She understands and is in agreement.  I will see her back in 4 months or so.  We will have to make sure that she is optimized better before elective major surgical intervention is attempted   Greater than 50% of the 30 minutes  visit was spent in counseling/coordination of care   Caroleen Hamman, MD [place] Surgeon

## 2021-07-18 ENCOUNTER — Telehealth (INDEPENDENT_AMBULATORY_CARE_PROVIDER_SITE_OTHER): Payer: Self-pay

## 2021-07-18 NOTE — Telephone Encounter (Signed)
Patient was made aware with medical advice and requested for sooner appointment to speak Dr Lucky Cowboy. Patient is schedule to come in 08/02/21 and was last seen 07/08/21. I will speak with Dr Lucky Cowboy when he returns in the office

## 2021-07-19 ENCOUNTER — Other Ambulatory Visit (INDEPENDENT_AMBULATORY_CARE_PROVIDER_SITE_OTHER): Payer: Self-pay | Admitting: Nurse Practitioner

## 2021-07-19 MED ORDER — HYDROCODONE-ACETAMINOPHEN 5-325 MG PO TABS: 1.0000 | ORAL_TABLET | Freq: Three times a day (TID) | ORAL | 0 refills | Status: DC | PRN

## 2021-07-19 NOTE — Telephone Encounter (Signed)
Patient was made aware with Dr Lucky Cowboy advice that he will only do 1 time refill for Hydrocodone and 1 time refill for Tramadol for pain relief. Patient will keep appointment as schedule on 08/02/21

## 2021-07-22 ENCOUNTER — Ambulatory Visit (INDEPENDENT_AMBULATORY_CARE_PROVIDER_SITE_OTHER): Payer: Medicare Other | Admitting: Vascular Surgery

## 2021-07-26 ENCOUNTER — Other Ambulatory Visit (INDEPENDENT_AMBULATORY_CARE_PROVIDER_SITE_OTHER): Payer: Self-pay | Admitting: Nurse Practitioner

## 2021-07-26 MED ORDER — TRAMADOL HCL 50 MG PO TABS: 50.0000 mg | ORAL_TABLET | Freq: Four times a day (QID) | ORAL | 0 refills | Status: DC | PRN

## 2021-07-26 NOTE — Telephone Encounter (Signed)
Patient called to see if provider would send in refill for Tramadol. Patient states that she received refill for Hydrocodone but not the other as stated in previous phone note. I advised patient to call pharmacy to check to see if it is there and if not to call me back so that I can make providers aware.

## 2021-07-26 NOTE — Telephone Encounter (Signed)
I have sent in a refill for the tramadol.  The refill for tramadol was not sent because we do not automatically send both prescriptions at the same time.  Both of these medications being controlled substances, how they are prescribed is very tightly controlled.  We only sent prescriptions once they are requested.  I have sent in the request for tramadol.  As per stated by Dr. Lucky Cowboy going forward no further refills for narcotic medications will be filled unless he makes further exceptions.

## 2021-07-26 NOTE — Telephone Encounter (Signed)
Patient called pharmacy and called office back to inform me that the Tramadol was not sent per pharmacy [facility] on [address]) Please advise.

## 2021-07-27 NOTE — Telephone Encounter (Signed)
Called and made patient aware of NP's note.

## 2021-08-02 ENCOUNTER — Encounter (INDEPENDENT_AMBULATORY_CARE_PROVIDER_SITE_OTHER): Payer: Medicare Other

## 2021-08-02 ENCOUNTER — Ambulatory Visit (INDEPENDENT_AMBULATORY_CARE_PROVIDER_SITE_OTHER): Payer: Medicare Other | Admitting: Vascular Surgery

## 2021-08-12 ENCOUNTER — Encounter (INDEPENDENT_AMBULATORY_CARE_PROVIDER_SITE_OTHER): Payer: Medicare Other

## 2021-08-12 ENCOUNTER — Ambulatory Visit (INDEPENDENT_AMBULATORY_CARE_PROVIDER_SITE_OTHER): Payer: Medicare Other | Admitting: Vascular Surgery

## 2021-09-20 ENCOUNTER — Ambulatory Visit (INDEPENDENT_AMBULATORY_CARE_PROVIDER_SITE_OTHER): Payer: Medicare Other

## 2021-09-20 ENCOUNTER — Other Ambulatory Visit: Payer: Self-pay

## 2021-09-20 ENCOUNTER — Ambulatory Visit (INDEPENDENT_AMBULATORY_CARE_PROVIDER_SITE_OTHER): Payer: Medicare Other | Admitting: Vascular Surgery

## 2021-09-20 VITALS — BP 115/77 | HR 59 | Ht 62.0 in | Wt 243.0 lb

## 2021-09-20 DIAGNOSIS — E785 Hyperlipidemia, unspecified: Secondary | ICD-10-CM

## 2021-09-20 DIAGNOSIS — I998 Other disorder of circulatory system: Secondary | ICD-10-CM | POA: Diagnosis not present

## 2021-09-20 DIAGNOSIS — M79671 Pain in right foot: Secondary | ICD-10-CM | POA: Diagnosis not present

## 2021-09-20 DIAGNOSIS — I10 Essential (primary) hypertension: Secondary | ICD-10-CM | POA: Diagnosis not present

## 2021-09-20 DIAGNOSIS — I739 Peripheral vascular disease, unspecified: Secondary | ICD-10-CM

## 2021-09-20 DIAGNOSIS — K436 Other and unspecified ventral hernia with obstruction, without gangrene: Secondary | ICD-10-CM

## 2021-09-20 DIAGNOSIS — R7303 Prediabetes: Secondary | ICD-10-CM | POA: Diagnosis not present

## 2021-09-20 NOTE — Progress Notes (Signed)
MRN : [id number]  Carla [name] is a 60 y.o. (Aug 06, 1961) female who presents with chief complaint of  Chief Complaint  Patient presents with   Follow-up    1 Mo abi  .  History of Present Illness: Patient returns today in follow up of her PAD.  She still complains of the severe nerve pain in both feet worse on the right than the left.  She is still bothered by her large ventral hernia and is scheduled to see Dr. Dahlia Byes again in about 2 to 3 months.  She had thrombosis with cessation of anticoagulation when she had her last ventral hernia repair planned and it has not yet been fixed.  We were able to get this open with overnight thrombolytic therapy and subsequent intervention.  She remains on anticoagulation. ABIs today are 1.08 on the right and 0.87 on the left with biphasic waveforms.  Current Outpatient Medications  Medication Sig Dispense Refill   albuterol (VENTOLIN HFA) 108 (90 Base) MCG/ACT inhaler Inhale 2 puffs into the lungs every 6 (six) hours as needed for wheezing or shortness of breath.     ALPRAZolam (XANAX) 0.5 MG tablet Take 1 tablet (0.5 mg total) by mouth 2 (two) times daily as needed for anxiety. 8 tablet 0   apixaban (ELIQUIS) 5 MG TABS tablet Take 1 tablet (5 mg total) by mouth 2 (two) times daily. 60 tablet 11   aspirin EC 81 MG tablet Take 81 mg by mouth daily. Swallow whole.     atorvastatin (LIPITOR) 80 MG tablet Take 80 mg by mouth at bedtime.     COMBIVENT RESPIMAT 20-100 MCG/ACT AERS respimat Inhale 1 puff into the lungs daily as needed for wheezing or shortness of breath.     ferrous sulfate 325 (65 FE) MG tablet Take 325 mg by mouth daily.     isosorbide mononitrate (IMDUR) 30 MG 24 hr tablet Take 15 mg by mouth daily.     metoprolol tartrate (LOPRESSOR) 50 MG tablet Take 1 tablet (50 mg total) by mouth 2 (two) times daily. 30 tablet 0   nitroGLYCERIN (NITROSTAT) 0.4 MG SL tablet Place 0.4 mg under the tongue as directed.     omeprazole (PRILOSEC) 20 MG  capsule Take 20 mg by mouth 2 (two) times daily before a meal.     pregabalin (LYRICA) 100 MG capsule Take 100 mg by mouth 3 (three) times daily.     traMADol (ULTRAM) 50 MG tablet Take 1 tablet (50 mg total) by mouth every 6 (six) hours as needed. 20 tablet 0   No current facility-administered medications for this visit.    Past Medical History:  Diagnosis Date   Abdominal aortic atherosclerosis ([place])    a. 05/2017 CTA abd/pelvis: significant atherosclerotic dzs of the infrarenal abd Ao w/ some mural thrombus. No aneurysm or dissection.   Acute focal ischemia of small intestine (HCC)    Acute right-sided low back pain with right-sided sciatica 06/13/2017   AKI (acute kidney injury) ([place]) 07/29/2017   Anginal pain ([place])    a. 08/2012 Lexiscan MV: EF 54%, non ischemia/infarct.   Anxiety and depression    Baker's cyst of knee, right    a. 07/2016 U/S: 4.1 x 1.4 x 2.9 cystic structure in R poplitetal fossa.   Bell's palsy    Borderline diabetes    CHF (congestive heart failure) (HCC)    Chronic anticoagulation    apixaban   Chronic idiopathic constipation    Chronic mesenteric ischemia (  HCC)    Chronic pain    COPD (chronic obstructive pulmonary disease) (HCC)    Diastolic dysfunction    a. 05/2017 Echo: Ef 60-65%, no rwma, Gr1 DD, no source of cardiac emboli.   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 05/2017 CTA Abd/pelvis: apparent thrombus or embolus in prox SMA (70-90%); b. 05/2017 catheter directed tPA, mechanical thrombectomy, and stenting of the SMA.   Essential hypertension with goal blood pressure less than 140/90 01/19/2016   Gastroesophageal reflux disease 01/19/2016   GERD (gastroesophageal reflux disease)    H/O colectomy    History of 2019 novel coronavirus disease (COVID-19) 11/14/2019   History of kidney stones    Hyperlipidemia    Hypertension    Morbid obesity with BMI of 40.0-44.9, adult ([place])    Occlusive mesenteric ischemia ([place]) 06/19/2017    Osteoarthritis    PAD (peripheral artery disease) (HCC)    PAF (paroxysmal atrial fibrillation) (HCC)    Palpitations    Peripheral edema    Personal history of other malignant neoplasm of skin 06/01/2016   Pneumonia    Pre-diabetes    Primary osteoarthritis of both knees 06/13/2017   SBO (small bowel obstruction) ([place]) 08/06/2017   Skin cancer of nose    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HCC)    Vitamin D deficiency     Past Surgical History:  Procedure Laterality Date   AMPUTATION TOE Left 12/09/2019   Procedure: AMPUTATION TOE MPJ left;  Surgeon: Caroline More, DPM;  Location: [facility];  Service: Podiatry;  Laterality: Left;   APPENDECTOMY     CHOLECYSTECTOMY     COLON SURGERY     EMBOLECTOMY OR THROMBECTOMY, WITH OR WITHOUT CATHETER; FEMOROPOPLITEAL, AORTOILIAC ARTERY, BY LEG INCISION N/A 06/21/2020   Left - Decomp Fasciotomy Leg; Ant &/Or Lat Compart Only; Midline - Angiography, Visceral, Selective Or Supraselective (With Or Without Flush Aortogram); Left - Revascularize, Endovasc, Open/Percut, Iliac Artery, Unilat, Initial Vessel; W/Translum Stent, W/Angioplasty; Right - Revascularize, Endovasc, Open/Percut, Iliac Artery, Ea Add`L Ipsilateral; W/Translumin Stent, W/Angioplasty; Location [facility];   LAPAROTOMY N/A 08/08/2017   Procedure: EXPLORATORY LAPAROTOMY POSSIBLE BOWEL RESECTION;  Surgeon: Jules Husbands, MD;  Location: [facility];  Service: General;  Laterality: N/A;   LEFT HEART CATH AND CORONARY ANGIOGRAPHY N/A 05/17/2021   Procedure: LEFT HEART CATH AND CORONARY ANGIOGRAPHY;  Surgeon: Yolonda Kida, MD;  Location: [facility];  Service: Cardiovascular;  Laterality: N/A;   LOWER EXTREMITY ANGIOGRAPHY Left 11/17/2019   Procedure: LOWER EXTREMITY ANGIOGRAPHY;  Surgeon: Algernon Huxley, MD;  Location: [facility];  Service: Cardiovascular;  Laterality: Left;   LOWER EXTREMITY ANGIOGRAPHY Right 12/13/2020   Procedure: LOWER EXTREMITY ANGIOGRAPHY;   Surgeon: Algernon Huxley, MD;  Location: [facility];  Service: Cardiovascular;  Laterality: Right;   LOWER EXTREMITY ANGIOGRAPHY Right 01/10/2021   Procedure: LOWER EXTREMITY ANGIOGRAPHY;  Surgeon: Algernon Huxley, MD;  Location: [facility];  Service: Cardiovascular;  Laterality: Right;   LOWER EXTREMITY ANGIOGRAPHY Right 01/24/2021   Procedure: LOWER EXTREMITY ANGIOGRAPHY;  Surgeon: Algernon Huxley, MD;  Location: [facility];  Service: Cardiovascular;  Laterality: Right;   LOWER EXTREMITY ANGIOGRAPHY Bilateral 06/28/2021   Procedure: Lower Extremity Angiography;  Surgeon: Algernon Huxley, MD;  Location: [facility];  Service: Cardiovascular;  Laterality: Bilateral;   LOWER EXTREMITY INTERVENTION N/A 11/19/2019   Procedure: LOWER EXTREMITY INTERVENTION;  Surgeon: Algernon Huxley, MD;  Location: [facility];  Service: Cardiovascular;  Laterality: N/A;   LOWER EXTREMITY INTERVENTION Bilateral 06/29/2021   Procedure: LOWER EXTREMITY INTERVENTION;  Surgeon: Algernon Huxley, MD;  Location: [facility];  Service: Cardiovascular;  Laterality: Bilateral;   TEE WITHOUT CARDIOVERSION N/A 06/22/2017   Procedure: TRANSESOPHAGEAL ECHOCARDIOGRAM (TEE);  Surgeon: Wellington Hampshire, MD;  Location: [facility];  Service: Cardiovascular;  Laterality: N/A;   TOTAL KNEE ARTHROPLASTY Right 07/11/2018   Procedure: TOTAL KNEE ARTHROPLASTY;  Surgeon: Corky Mull, MD;  Location: [facility];  Service: Orthopedics;  Laterality: Right;   VAGINAL HYSTERECTOMY     VISCERAL ARTERY INTERVENTION N/A 06/20/2017   Procedure: Visceral Artery Intervention, possible aortic thrombectomy;  Surgeon: Algernon Huxley, MD;  Location: [facility];  Service: Cardiovascular;  Laterality: N/A;   VISCERAL ARTERY INTERVENTION N/A 01/28/2018   Procedure: VISCERAL ARTERY INTERVENTION;  Surgeon: Algernon Huxley, MD;  Location: [facility];  Service: Cardiovascular;  Laterality: N/A;     Social History    Tobacco Use   Smoking status: Former    Packs/day: 0.10    Types: Cigarettes   Smokeless tobacco: Never  Vaping Use   Vaping Use: Never used  Substance Use Topics   Alcohol use: No   Drug use: No      Family History  Problem Relation Age of Onset   Hypertension Mother    Heart disease Mother    Heart attack Mother    Breast cancer Mother    Hypertension Father    Breast cancer Paternal Aunt      Allergies  Allergen Reactions   Gabapentin     Tremors    Bactrim [Sulfamethoxazole-Trimethoprim] Itching      REVIEW OF SYSTEMS (Negative unless checked)   Constitutional: [] Weight loss  [] Fever  [] Chills Cardiac: [] Chest pain   [] Chest pressure   [] Palpitations   [] Shortness of breath when laying flat   [] Shortness of breath at rest   [] Shortness of breath with exertion. Vascular:  [] Pain in legs with walking   [] Pain in legs at rest   [] Pain in legs when laying flat   [] Claudication   [x] Pain in feet when walking  [x] Pain in feet at rest  [x] Pain in feet when laying flat   [] History of DVT   [] Phlebitis   [] Swelling in legs   [] Varicose veins   [] Non-healing ulcers Pulmonary:   [] Uses home oxygen   [] Productive cough   [] Hemoptysis   [] Wheeze  [] COPD   [] Asthma Neurologic:  [] Dizziness  [] Blackouts   [] Seizures   [] History of stroke   [] History of TIA  [] Aphasia   [] Temporary blindness   [] Dysphagia   [] Weakness or numbness in arms   [x] Weakness or numbness in legs Musculoskeletal:  [] Arthritis   [] Joint swelling   [] Joint pain   [] Low back pain Hematologic:  [] Easy bruising  [] Easy bleeding   [x] Hypercoagulable state   [] Anemic   Gastrointestinal:  [] Blood in stool   [] Vomiting blood  [] Gastroesophageal reflux/heartburn   [] Abdominal pain Genitourinary:  [] Chronic kidney disease   [] Difficult urination  [] Frequent urination  [] Burning with urination   [] Hematuria Skin:  [] Rashes   [] Ulcers   [] Wounds Psychological:  [] History of anxiety   []  History of major  depression.  Physical Examination  BP 115/77   Pulse (!) 59   Ht 5\' 2"  (1.575 m)   Wt 243 lb (110.2 kg)   BMI 44.45 kg/m  Gen:  WD/WN, NAD.  Appears older than stated age Head: [place]/AT, No temporalis wasting. Ear/Nose/Throat: Hearing grossly intact, nares w/o erythema or drainage Eyes: Conjunctiva clear. Sclera non-icteric Neck: Supple.  Trachea midline Pulmonary:  Good air movement, no use of accessory muscles.  Cardiac: RRR, no JVD Vascular:  Vessel Right Left  Radial Palpable Palpable                          PT Palpable Palpable  DP Palpable Palpable   Gastrointestinal: soft, massive ventral hernia Musculoskeletal: M/S 5/5 throughout.  No deformity or atrophy.  Trace lower extremity edema. Neurologic: Sensation grossly intact in extremities.  Symmetrical.  Speech is fluent.  Psychiatric: Judgment intact, Mood & affect appropriate for pt's clinical situation. Dermatologic: No rashes or ulcers noted.  No cellulitis or open wounds.      Labs Recent Results (from the past 2160 hour(s))  SARS CORONAVIRUS 2 (TAT 6-24 HRS) Nasopharyngeal Nasopharyngeal Swab     Status: None   Collection Time: 06/24/21  9:15 AM   Specimen: Nasopharyngeal Swab  Result Value Ref Range   SARS Coronavirus 2 NEGATIVE NEGATIVE    Comment: (NOTE) SARS-CoV-2 target nucleic acids are NOT DETECTED.  The SARS-CoV-2 RNA is generally detectable in upper and lower respiratory specimens during the acute phase of infection. Negative results do not preclude SARS-CoV-2 infection, do not rule out co-infections with other pathogens, and should not be used as the sole basis for treatment or other patient management decisions. Negative results must be combined with clinical observations, patient history, and epidemiological information. The expected result is Negative.  Fact Sheet for Patients: SugarRoll.be  Fact Sheet for Healthcare  Providers: https://www.woods-mathews.com/  This test is not yet approved or cleared by the [place] FDA and  has been authorized for detection and/or diagnosis of SARS-CoV-2 by FDA under an Emergency Use Authorization (EUA). This EUA will remain  in effect (meaning this test can be used) for the duration of the COVID-19 declaration under Se ction 564(b)(1) of the Act, 21 U.S.C. section 360bbb-3(b)(1), unless the authorization is terminated or revoked sooner.  Performed at [facility], [place] [address], [place], [place] [address]   CBC with Differential     Status: Abnormal   Collection Time: 06/26/21 10:48 PM  Result Value Ref Range   WBC 11.0 (H) 4.0 - 10.5 K/uL   RBC 4.32 3.87 - 5.11 MIL/uL   Hemoglobin 13.1 12.0 - 15.0 g/dL   HCT 39.7 36.0 - 46.0 %   MCV 91.9 80.0 - 100.0 fL   MCH 30.3 26.0 - 34.0 pg   MCHC 33.0 30.0 - 36.0 g/dL   RDW 16.0 (H) 11.5 - 15.5 %   Platelets 200 150 - 400 K/uL   nRBC 0.0 0.0 - 0.2 %   Neutrophils Relative % 70 %   Neutro Abs 7.7 1.7 - 7.7 K/uL   Lymphocytes Relative 22 %   Lymphs Abs 2.4 0.7 - 4.0 K/uL   Monocytes Relative 6 %   Monocytes Absolute 0.7 0.1 - 1.0 K/uL   Eosinophils Relative 1 %   Eosinophils Absolute 0.1 0.0 - 0.5 K/uL   Basophils Relative 0 %   Basophils Absolute 0.0 0.0 - 0.1 K/uL   Immature Granulocytes 1 %   Abs Immature Granulocytes 0.10 (H) 0.00 - 0.07 K/uL    Comment: Performed at [facility], [address] [place], [place] [address]  Basic metabolic panel     Status: Abnormal  Collection Time: 06/26/21 10:48 PM  Result Value Ref Range   Sodium 140 135 - 145 mmol/L   Potassium 3.8 3.5 - 5.1 mmol/L   Chloride 107 98 - 111 mmol/L   CO2 24 22 - 32 mmol/L   Glucose, Bld 114 (H) 70 - 99 mg/dL    Comment: Glucose reference range applies only to samples taken after fasting for at least 8 hours.   BUN 21 8 - 23 mg/dL   Creatinine, Ser 0.95 0.44 - 1.00 mg/dL   Calcium 8.4 (L) 8.9 - 10.3  mg/dL   GFR, Estimated >60 >60 mL/min    Comment: (NOTE) Calculated using the CKD-EPI Creatinine Equation (2021)    Anion gap 9 5 - 15    Comment: Performed at [facility], [place], [place] [address]  Heparin level (unfractionated)     Status: Abnormal   Collection Time: 06/27/21  2:36 AM  Result Value Ref Range   Heparin Unfractionated <0.10 (L) 0.30 - 0.70 IU/mL    Comment: (NOTE) The clinical reportable range upper limit is being lowered to >1.10 to align with the FDA approved guidance for the current laboratory assay.  If heparin results are below expected values, and patient dosage has  been confirmed, suggest follow up testing of antithrombin III levels. Performed at [facility], [address] [place], [place] [address]   APTT     Status: None   Collection Time: 06/27/21  2:36 AM  Result Value Ref Range   aPTT 25 24 - 36 seconds    Comment: Performed at [facility], [address] [place], [place] [address]  Protime-INR     Status: None   Collection Time: 06/27/21  2:36 AM  Result Value Ref Range   Prothrombin Time 12.3 11.4 - 15.2 seconds   INR 0.9 0.8 - 1.2    Comment: Performed at [facility], [address], [place], [place] [address]  Resp Panel by RT-PCR (Flu A&B, Covid) Nasopharyngeal Swab     Status: None   Collection Time: 06/27/21  3:35 AM   Specimen: Nasopharyngeal Swab; Nasopharyngeal(NP) swabs in vial transport medium  Result Value Ref Range   SARS Coronavirus 2 by RT PCR NEGATIVE NEGATIVE    Comment: (NOTE) SARS-CoV-2 target nucleic acids are NOT DETECTED.  The SARS-CoV-2 RNA is generally detectable in upper respiratory specimens during the acute phase of infection. The lowest concentration of SARS-CoV-2 viral copies this assay can detect is 138 copies/mL. A negative result does not preclude SARS-Cov-2 infection and should not be used as the sole basis for treatment or other patient  management decisions. A negative result may occur with  improper specimen collection/handling, submission of specimen other than nasopharyngeal swab, presence of viral mutation(s) within the areas targeted by this assay, and inadequate number of viral copies(<138 copies/mL). A negative result must be combined with clinical observations, patient history, and epidemiological information. The expected result is Negative.  Fact Sheet for Patients:  EntrepreneurPulse.com.au  Fact Sheet for Healthcare Providers:  IncredibleEmployment.be  This test is no t yet approved or cleared by the [place] FDA and  has been authorized for detection and/or diagnosis of SARS-CoV-2 by FDA under an Emergency Use Authorization (EUA). This EUA will remain  in effect (meaning this test can be used) for the duration of the COVID-19 declaration under Section 564(b)(1) of the Act, 21 U.S.C.section 360bbb-3(b)(1), unless the authorization is terminated  or revoked sooner.  Influenza A by PCR NEGATIVE NEGATIVE   Influenza B by PCR NEGATIVE NEGATIVE    Comment: (NOTE) The Xpert Xpress SARS-CoV-2/FLU/RSV plus assay is intended as an aid in the diagnosis of influenza from Nasopharyngeal swab specimens and should not be used as a sole basis for treatment. Nasal washings and aspirates are unacceptable for Xpert Xpress SARS-CoV-2/FLU/RSV testing.  Fact Sheet for Patients: EntrepreneurPulse.com.au  Fact Sheet for Healthcare Providers: IncredibleEmployment.be  This test is not yet approved or cleared by the [place] FDA and has been authorized for detection and/or diagnosis of SARS-CoV-2 by FDA under an Emergency Use Authorization (EUA). This EUA will remain in effect (meaning this test can be used) for the duration of the COVID-19 declaration under Section 564(b)(1) of the Act, 21 U.S.C. section 360bbb-3(b)(1), unless the  authorization is terminated or revoked.  Performed at [facility], [address] [place], [place] [address]   Basic metabolic panel     Status: Abnormal   Collection Time: 06/27/21  5:57 AM  Result Value Ref Range   Sodium 138 135 - 145 mmol/L   Potassium 4.1 3.5 - 5.1 mmol/L   Chloride 108 98 - 111 mmol/L   CO2 25 22 - 32 mmol/L   Glucose, Bld 99 70 - 99 mg/dL    Comment: Glucose reference range applies only to samples taken after fasting for at least 8 hours.   BUN 21 8 - 23 mg/dL   Creatinine, Ser 0.81 0.44 - 1.00 mg/dL   Calcium 8.2 (L) 8.9 - 10.3 mg/dL   GFR, Estimated >60 >60 mL/min    Comment: (NOTE) Calculated using the CKD-EPI Creatinine Equation (2021)    Anion gap 5 5 - 15    Comment: Performed at [facility], [address] [place], [place] [address]  CBC     Status: Abnormal   Collection Time: 06/27/21  5:57 AM  Result Value Ref Range   WBC 9.5 4.0 - 10.5 K/uL   RBC 4.22 3.87 - 5.11 MIL/uL   Hemoglobin 12.5 12.0 - 15.0 g/dL   HCT 38.3 36.0 - 46.0 %   MCV 90.8 80.0 - 100.0 fL   MCH 29.6 26.0 - 34.0 pg   MCHC 32.6 30.0 - 36.0 g/dL   RDW 16.3 (H) 11.5 - 15.5 %   Platelets 207 150 - 400 K/uL   nRBC 0.0 0.0 - 0.2 %    Comment: Performed at [facility], [place], [place] [address]  Heparin level (unfractionated)     Status: Abnormal   Collection Time: 06/27/21  8:56 AM  Result Value Ref Range   Heparin Unfractionated 0.72 (H) 0.30 - 0.70 IU/mL    Comment: (NOTE) The clinical reportable range upper limit is being lowered to >1.10 to align with the FDA approved guidance for the current laboratory assay.  If heparin results are below expected values, and patient dosage has  been confirmed, suggest follow up testing of antithrombin III levels. Performed at [facility], [address] [place], [place] [address]   APTT     Status: Abnormal   Collection Time: 06/27/21  8:56 AM  Result Value Ref Range    aPTT 65 (H) 24 - 36 seconds    Comment:        IF BASELINE aPTT IS ELEVATED, SUGGEST PATIENT RISK ASSESSMENT BE USED TO DETERMINE APPROPRIATE ANTICOAGULANT THERAPY. Performed at [facility], [address] [place], [place] [address]   APTT  Status: Abnormal   Collection Time: 06/27/21  5:54 PM  Result Value Ref Range   aPTT 81 (H) 24 - 36 seconds    Comment:        IF BASELINE aPTT IS ELEVATED, SUGGEST PATIENT RISK ASSESSMENT BE USED TO DETERMINE APPROPRIATE ANTICOAGULANT THERAPY. Performed at [facility], [address] [place], [place] [address]   Troponin I (High Sensitivity)     Status: Abnormal   Collection Time: 06/27/21  5:54 PM  Result Value Ref Range   Troponin I (High Sensitivity) 893 (HH) <18 ng/L    Comment: CRITICAL RESULT CALLED TO, READ BACK BY AND VERIFIED WITH KIM Domingo Cocking AT 1845 ON 06/27/21 BY SS (NOTE) Elevated high sensitivity troponin I (hsTnI) values and significant  changes across serial measurements may suggest ACS but many other  chronic and acute conditions are known to elevate hsTnI results.  Refer to the "Links" section for chest pain algorithms and additional  guidance. Performed at [facility], [place], [place] [address]   Troponin I (High Sensitivity)     Status: Abnormal   Collection Time: 06/27/21  7:48 PM  Result Value Ref Range   Troponin I (High Sensitivity) 1,587 (HH) <18 ng/L    Comment: CRITICAL VALUE NOTED. VALUE IS CONSISTENT WITH PREVIOUSLY REPORTED/CALLED VALUE MJU (NOTE) Elevated high sensitivity troponin I (hsTnI) values and significant  changes across serial measurements may suggest ACS but many other  chronic and acute conditions are known to elevate hsTnI results.  Refer to the "Links" section for chest pain algorithms and additional  guidance. Performed at [facility], [address] [place], [place] [address]   APTT     Status: Abnormal   Collection  Time: 06/27/21 10:46 PM  Result Value Ref Range   aPTT 72 (H) 24 - 36 seconds    Comment:        IF BASELINE aPTT IS ELEVATED, SUGGEST PATIENT RISK ASSESSMENT BE USED TO DETERMINE APPROPRIATE ANTICOAGULANT THERAPY. Performed at [facility], [address], [place], [place] [address]   Troponin I (High Sensitivity)     Status: Abnormal   Collection Time: 06/27/21 10:46 PM  Result Value Ref Range   Troponin I (High Sensitivity) 1,748 (HH) <18 ng/L    Comment: CRITICAL VALUE NOTED. VALUE IS CONSISTENT WITH PREVIOUSLY REPORTED/CALLED VALUE RH (NOTE) Elevated high sensitivity troponin I (hsTnI) values and significant  changes across serial measurements may suggest ACS but many other  chronic and acute conditions are known to elevate hsTnI results.  Refer to the "Links" section for chest pain algorithms and additional  guidance. Performed at [facility], [address] [place], [place] [address]   Basic metabolic panel     Status: Abnormal   Collection Time: 06/28/21  4:26 AM  Result Value Ref Range   Sodium 138 135 - 145 mmol/L   Potassium 4.1 3.5 - 5.1 mmol/L   Chloride 106 98 - 111 mmol/L   CO2 26 22 - 32 mmol/L   Glucose, Bld 118 (H) 70 - 99 mg/dL    Comment: Glucose reference range applies only to samples taken after fasting for at least 8 hours.   BUN 18 8 - 23 mg/dL   Creatinine, Ser 0.63 0.44 - 1.00 mg/dL   Calcium 8.6 (L) 8.9 - 10.3 mg/dL   GFR, Estimated >60 >60 mL/min    Comment: (NOTE) Calculated using the CKD-EPI Creatinine Equation (2021)    Anion gap 6 5 -  15    Comment: Performed at [facility], [address] [place], [place] [address]  CBC     Status: Abnormal   Collection Time: 06/28/21  4:26 AM  Result Value Ref Range   WBC 8.3 4.0 - 10.5 K/uL   RBC 4.08 3.87 - 5.11 MIL/uL   Hemoglobin 12.5 12.0 - 15.0 g/dL   HCT 37.0 36.0 - 46.0 %   MCV 90.7 80.0 - 100.0 fL   MCH 30.6 26.0 - 34.0 pg   MCHC 33.8 30.0 - 36.0 g/dL    RDW 15.9 (H) 11.5 - 15.5 %   Platelets 183 150 - 400 K/uL   nRBC 0.0 0.0 - 0.2 %    Comment: Performed at [facility], [place], [place] [address]  Troponin I (High Sensitivity)     Status: Abnormal   Collection Time: 06/28/21  4:26 AM  Result Value Ref Range   Troponin I (High Sensitivity) 868 (HH) <18 ng/L    Comment: CRITICAL VALUE NOTED. VALUE IS CONSISTENT WITH PREVIOUSLY REPORTED/CALLED VALUE RH (NOTE) Elevated high sensitivity troponin I (hsTnI) values and significant  changes across serial measurements may suggest ACS but many other  chronic and acute conditions are known to elevate hsTnI results.  Refer to the "Links" section for chest pain algorithms and additional  guidance. Performed at [facility], [place], [place] [address]   Heparin level (unfractionated)     Status: Abnormal   Collection Time: 06/28/21  4:26 AM  Result Value Ref Range   Heparin Unfractionated 0.72 (H) 0.30 - 0.70 IU/mL    Comment: (NOTE) The clinical reportable range upper limit is being lowered to >1.10 to align with the FDA approved guidance for the current laboratory assay.  If heparin results are below expected values, and patient dosage has  been confirmed, suggest follow up testing of antithrombin III levels. Performed at [facility], [address] [place], [place] [address]   APTT     Status: Abnormal   Collection Time: 06/28/21  4:26 AM  Result Value Ref Range   aPTT 82 (H) 24 - 36 seconds    Comment:        IF BASELINE aPTT IS ELEVATED, SUGGEST PATIENT RISK ASSESSMENT BE USED TO DETERMINE APPROPRIATE ANTICOAGULANT THERAPY. Performed at [facility], [place], [place] [address]   Troponin I (High Sensitivity)     Status: Abnormal   Collection Time: 06/28/21  6:00 AM  Result Value Ref Range   Troponin I (High Sensitivity) 735 (HH) <18 ng/L    Comment: CRITICAL VALUE NOTED. VALUE IS  CONSISTENT WITH PREVIOUSLY REPORTED/CALLED VALUE   SB (NOTE) Elevated high sensitivity troponin I (hsTnI) values and significant  changes across serial measurements may suggest ACS but many other  chronic and acute conditions are known to elevate hsTnI results.  Refer to the "Links" section for chest pain algorithms and additional  guidance. Performed at [facility], [place], [place] [address]   Heparin level (unfractionated)     Status: None   Collection Time: 06/28/21 11:17 AM  Result Value Ref Range   Heparin Unfractionated 0.57 0.30 - 0.70 IU/mL    Comment: (NOTE) The clinical reportable range upper limit is being lowered to >1.10 to align with the FDA approved guidance for the current laboratory assay.  If heparin results are below expected values, and patient dosage has  been confirmed, suggest follow up testing  of antithrombin III levels. Performed at [facility], [address] [place], [place] [address]   CBC every 6 hours x 4 post-procedure     Status: Abnormal   Collection Time: 06/28/21  6:03 PM  Result Value Ref Range   WBC 10.1 4.0 - 10.5 K/uL   RBC 4.21 3.87 - 5.11 MIL/uL   Hemoglobin 12.7 12.0 - 15.0 g/dL   HCT 38.3 36.0 - 46.0 %   MCV 91.0 80.0 - 100.0 fL   MCH 30.2 26.0 - 34.0 pg   MCHC 33.2 30.0 - 36.0 g/dL   RDW 16.2 (H) 11.5 - 15.5 %   Platelets 191 150 - 400 K/uL   nRBC 0.0 0.0 - 0.2 %    Comment: Performed at [facility], [address] [place], [place] [address]  Glucose, capillary     Status: Abnormal   Collection Time: 06/28/21  6:30 PM  Result Value Ref Range   Glucose-Capillary 102 (H) 70 - 99 mg/dL    Comment: Glucose reference range applies only to samples taken after fasting for at least 8 hours.  MRSA Next Gen by PCR, Nasal     Status: None   Collection Time: 06/28/21  6:31 PM   Specimen: Nasal Mucosa; Nasal Swab  Result Value Ref Range   MRSA by PCR Next Gen NOT DETECTED NOT DETECTED     Comment: (NOTE) The GeneXpert MRSA Assay (FDA approved for NASAL specimens only), is one component of a comprehensive MRSA colonization surveillance program. It is not intended to diagnose MRSA infection nor to guide or monitor treatment for MRSA infections. Test performance is not FDA approved in patients less than 47 years old. Performed at [facility], [address] [place], [place] [address]   Fibrinogen     Status: None   Collection Time: 06/28/21  6:45 PM  Result Value Ref Range   Fibrinogen 440 210 - 475 mg/dL    Comment: (NOTE) Fibrinogen results may be underestimated in patients receiving thrombolytic therapy. Performed at [facility], [address] [place], [place] [address]   CBC every 6 hours x 4 post-procedure     Status: Abnormal   Collection Time: 06/28/21 10:57 PM  Result Value Ref Range   WBC 8.8 4.0 - 10.5 K/uL   RBC 4.27 3.87 - 5.11 MIL/uL   Hemoglobin 12.8 12.0 - 15.0 g/dL   HCT 38.9 36.0 - 46.0 %   MCV 91.1 80.0 - 100.0 fL   MCH 30.0 26.0 - 34.0 pg   MCHC 32.9 30.0 - 36.0 g/dL   RDW 16.0 (H) 11.5 - 15.5 %   Platelets 179 150 - 400 K/uL   nRBC 0.0 0.0 - 0.2 %    Comment: Performed at [facility], [address] [place], [place] [address]  Fibrinogen every 6 hours x 4 post-procedure     Status: None   Collection Time: 06/28/21 10:57 PM  Result Value Ref Range   Fibrinogen 458 210 - 475 mg/dL    Comment: (NOTE) Fibrinogen results may be underestimated in patients receiving thrombolytic therapy. Performed at [facility], [address] [place], [place] [address]   Basic metabolic panel     Status: Abnormal   Collection Time: 06/29/21  5:48 AM  Result Value Ref Range   Sodium 137 135 - 145 mmol/L   Potassium 4.3 3.5 - 5.1 mmol/L   Chloride 101 98 - 111 mmol/L   CO2 27 22 - 32  mmol/L   Glucose, Bld 112 (H) 70 - 99 mg/dL    Comment: Glucose reference range applies only to samples taken after fasting for  at least 8 hours.   BUN 13 8 - 23 mg/dL   Creatinine, Ser 0.69 0.44 - 1.00 mg/dL   Calcium 8.6 (L) 8.9 - 10.3 mg/dL   GFR, Estimated >60 >60 mL/min    Comment: (NOTE) Calculated using the CKD-EPI Creatinine Equation (2021)    Anion gap 9 5 - 15    Comment: Performed at [facility], [address] [place], [place] [address]  CBC     Status: Abnormal   Collection Time: 06/29/21  5:48 AM  Result Value Ref Range   WBC 8.4 4.0 - 10.5 K/uL   RBC 4.27 3.87 - 5.11 MIL/uL   Hemoglobin 13.0 12.0 - 15.0 g/dL   HCT 38.7 36.0 - 46.0 %   MCV 90.6 80.0 - 100.0 fL   MCH 30.4 26.0 - 34.0 pg   MCHC 33.6 30.0 - 36.0 g/dL   RDW 15.9 (H) 11.5 - 15.5 %   Platelets 160 150 - 400 K/uL   nRBC 0.0 0.0 - 0.2 %    Comment: Performed at [facility], [address] [place], [place] [address]  Fibrinogen every 6 hours x 4 post-procedure     Status: None   Collection Time: 06/29/21  5:48 AM  Result Value Ref Range   Fibrinogen 332 210 - 475 mg/dL    Comment: (NOTE) Fibrinogen results may be underestimated in patients receiving thrombolytic therapy. Performed at [facility], [address] [place], [place] [address]   APTT     Status: Abnormal   Collection Time: 06/29/21 10:06 PM  Result Value Ref Range   aPTT 39 (H) 24 - 36 seconds    Comment:        IF BASELINE aPTT IS ELEVATED, SUGGEST PATIENT RISK ASSESSMENT BE USED TO DETERMINE APPROPRIATE ANTICOAGULANT THERAPY. Performed at [facility], [address] [place], [place] [address]   CBC     Status: Abnormal   Collection Time: 06/30/21  4:40 AM  Result Value Ref Range   WBC 8.8 4.0 - 10.5 K/uL   RBC 4.11 3.87 - 5.11 MIL/uL   Hemoglobin 12.5 12.0 - 15.0 g/dL   HCT 37.0 36.0 - 46.0 %   MCV 90.0 80.0 - 100.0 fL   MCH 30.4 26.0 - 34.0 pg   MCHC 33.8 30.0 - 36.0 g/dL   RDW 15.7 (H) 11.5 - 15.5 %   Platelets 164 150 - 400 K/uL   nRBC 0.0 0.0 - 0.2 %    Comment: Performed at [facility],  [place], [place] [address]  Heparin level (unfractionated)     Status: Abnormal   Collection Time: 06/30/21  4:40 AM  Result Value Ref Range   Heparin Unfractionated 0.27 (L) 0.30 - 0.70 IU/mL    Comment: (NOTE) The clinical reportable range upper limit is being lowered to >1.10 to align with the FDA approved guidance for the current laboratory assay.  If heparin results are below expected values, and patient dosage has  been confirmed, suggest follow up testing of antithrombin III levels. Performed at [facility], [address] [place], [place] [address]   APTT     Status: Abnormal   Collection Time: 06/30/21  4:40 AM  Result Value Ref Range   aPTT 51 (H) 24 - 36 seconds  Comment:        IF BASELINE aPTT IS ELEVATED, SUGGEST PATIENT RISK ASSESSMENT BE USED TO DETERMINE APPROPRIATE ANTICOAGULANT THERAPY. Performed at [facility], [address], [place], [place] [address]     Radiology No results found.  Assessment/Plan Essential hypertension with goal blood pressure less than 140/90 blood pressure control important in reducing the progression of atherosclerotic disease. On appropriate oral medications.     Prediabetes blood glucose control important in reducing the progression of atherosclerotic disease. Also, involved in wound healing. On appropriate medications.     Hyperlipidemia lipid control important in reducing the progression of atherosclerotic disease. 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 Would recommend Lovenox bridge with her significant clotting history.  PAD (peripheral artery disease) (HCC) ABIs today are 1.08 on the right and 0.87 on the left with biphasic waveforms.  Perfusion is currently intact.  Has severe neuropathic pain which persists  and I did give her 1 more prescription for tramadol but she will need to have a referral to the pain clinic at some point if she continues to have pain.  We will see her back in 3 months with noninvasive studies.  Continue anticoagulation.  If and when she ever has her ventral hernia repair, she will need to have a Lovenox bridge as she is highly thrombotic off anticoagulation.    [name], MD  09/20/2021 3:01 PM    This note was created with Dragon medical transcription system.  Any errors from dictation are purely unintentional

## 2021-09-20 NOTE — Assessment & Plan Note (Signed)
ABIs today are 1.08 on the right and 0.87 on the left with biphasic waveforms.  Perfusion is currently intact.  Has severe neuropathic pain which persists and I did give her 1 more prescription for tramadol but she will need to have a referral to the pain clinic at some point if she continues to have pain.  We will see her back in 3 months with noninvasive studies.  Continue anticoagulation.  If and when she ever has her ventral hernia repair, she will need to have a Lovenox bridge as she is highly thrombotic off anticoagulation.

## 2021-11-01 IMAGING — CT CT NECK W/ CM
4 of 5 series · 14 of 33 positions shown, 16 images · IV contrast (omnipaque)
Comparison: No pertinent prior exams are available for comparison.

CLINICAL DATA: Provided history: Nodule neck, TMJ inflammation.
Additional provided: Patient reports right submandibular mass for
2-3 years, patient reports right TMJ will come "in and out," former
smoker.

EXAM:
CT NECK WITH CONTRAST
TECHNIQUE: Multidetector CT imaging of the neck was performed using the
standard protocol following the bolus administration of intravenous
contrast.
CONTRAST:  75mL OMNIPAQUE IOHEXOL 300 MG/ML  SOLN

[Series 2: axial neck neck (person_name) 2.00 · axial · 0.59mm/px · z∈[-699,-587]mm · 3 of 112 slices shown]
[im 28/112  bone]
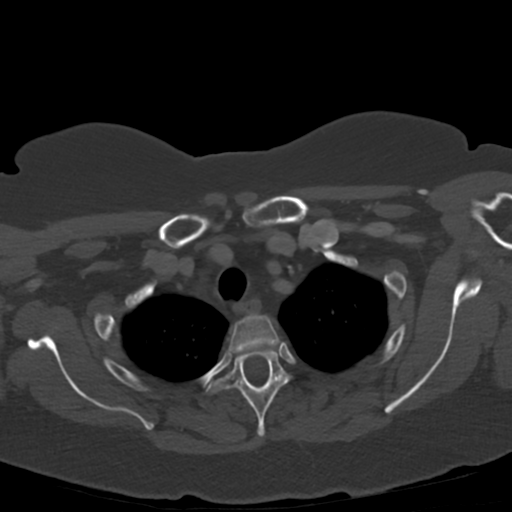
[im 56/112  bone]
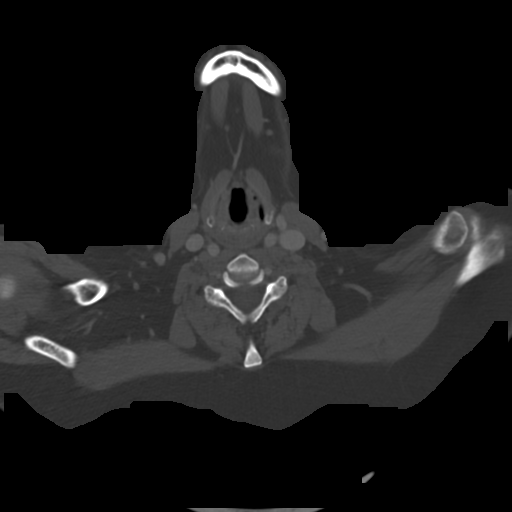
[im 84/112  bone]
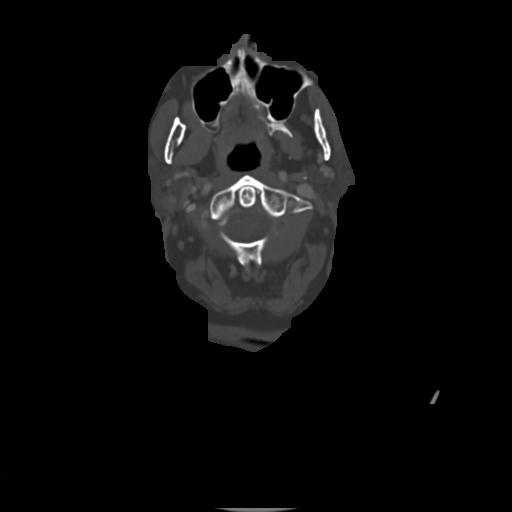

[Series 4: coronal neck neck (person_name) 2.00 cor · coronal · 0.54mm/px · 3 of 150 slices shown]
[im 42/150  bone]
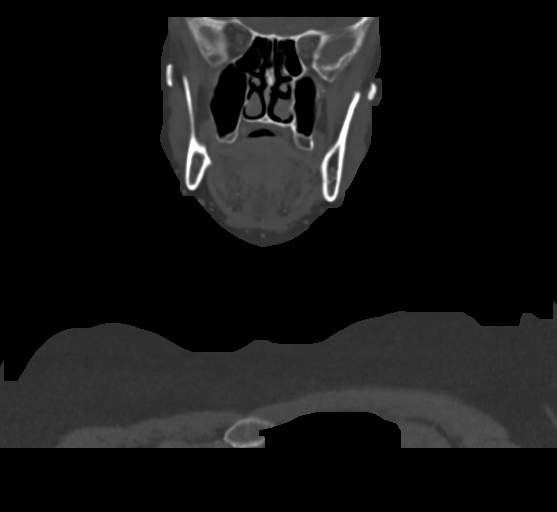
[im 64/150  bone]
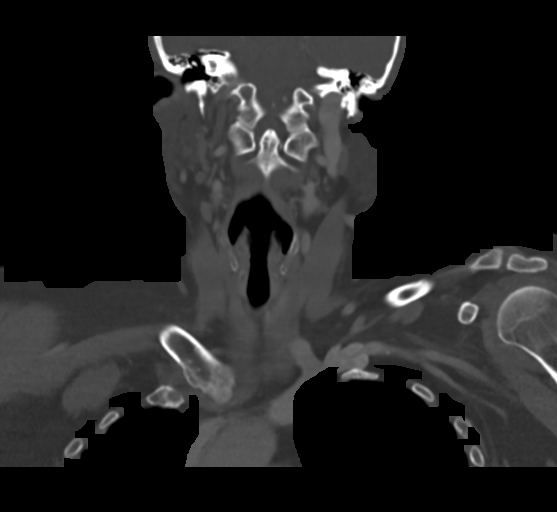
[im 86/150  bone]
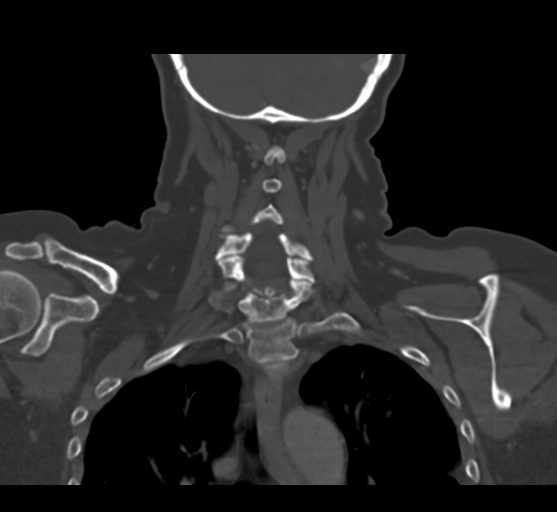

[Series 6: sagittal neck neck (person_name) 2.00 sag · sagittal · 0.54mm/px · 5 of 150 slices shown, 6 images]
[im 50/150  bone]
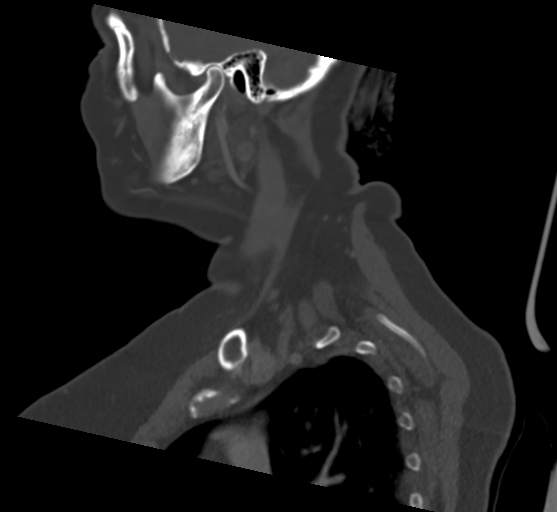
[im 63/150  bone]
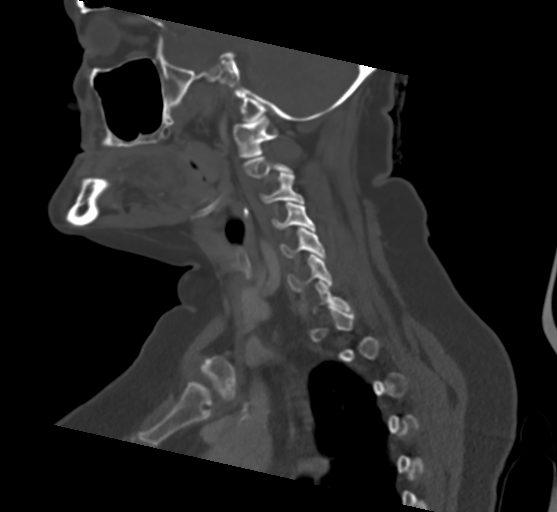
[im 75/150  soft-tissue]
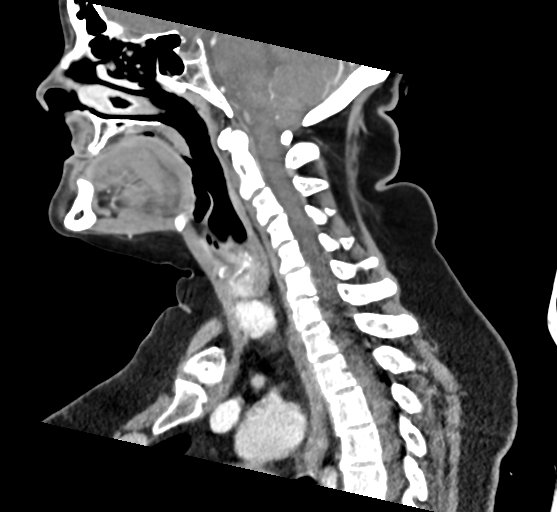
[im 75/150  bone]
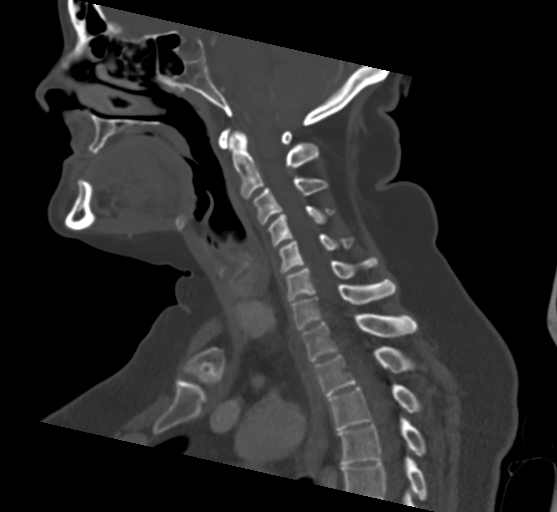
[im 87/150  bone]
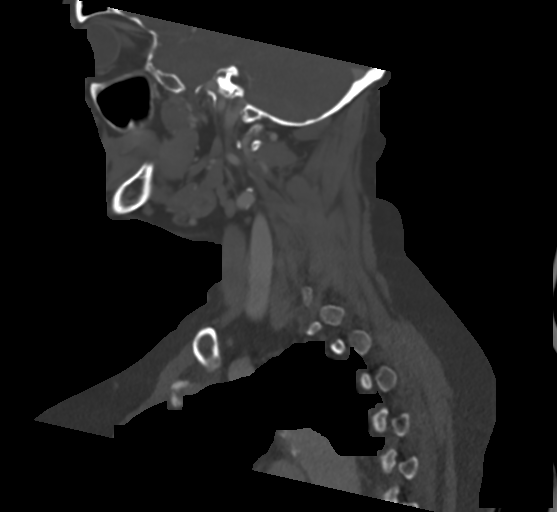
[im 100/150  bone]
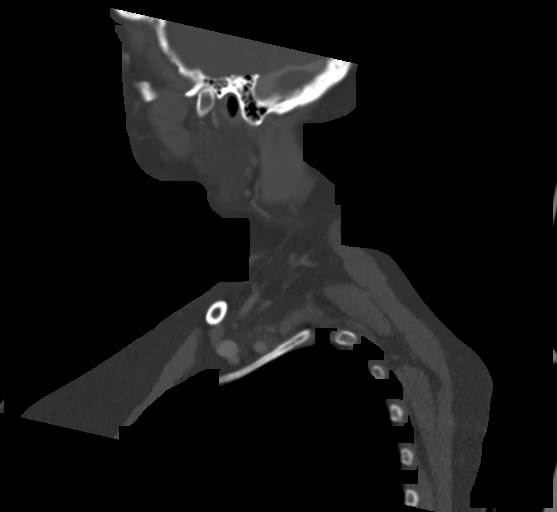

[Series 8: ax oropharynx neck neck (person_name) 2.00 ax · axial · 0.59mm/px · z∈[-740,-607]mm · 3 of 138 slices shown, 4 images]
[im 35/138  soft-tissue]
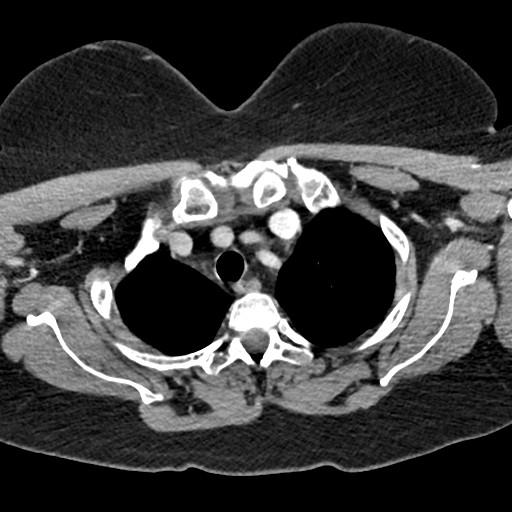
[im 35/138  bone]
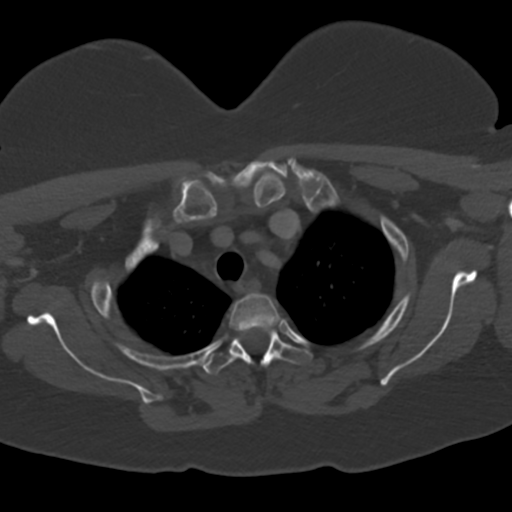
[im 69/138  bone]
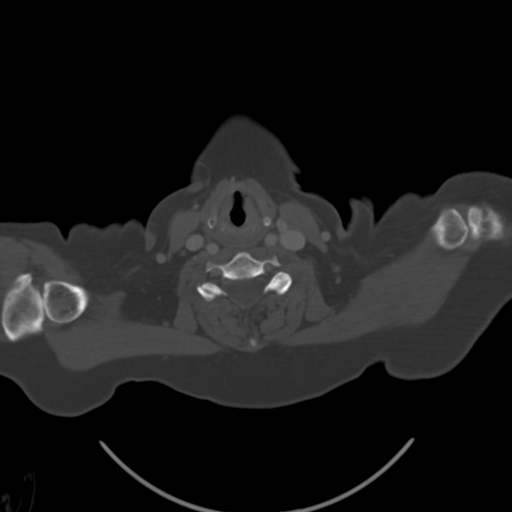
[im 103/138  bone]
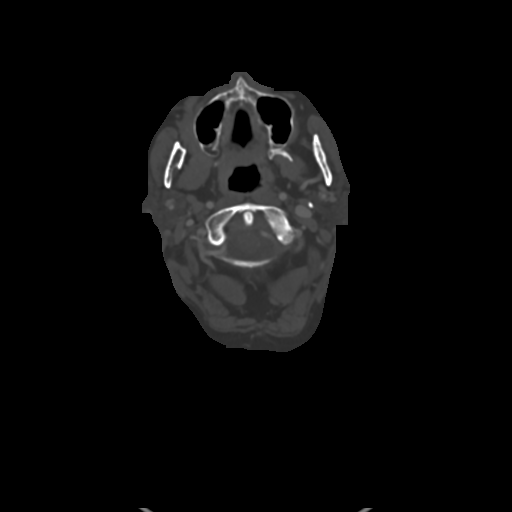

[14 of 33 positions shown; findings below may reference images not displayed]

FINDINGS: Pharynx and larynx: Nonspecific soft tissue fullness within the left
aspect of the nasopharynx without definite discrete mass identified.
No swelling or discrete mass is identified within the oral cavity,
pharynx or larynx.

Salivary glands: There are 2 nodules within the posterior right
parotid gland. There is a 10 mm homogeneously enhancing nodule along
the superficial aspect of the posterior gland (series 2, image 29).
There is an additional 10 mm nodule more medially which demonstrates
heterogeneous enhancement (series 2, image 29). These nodules are
located immediately deep to the right-sided skin surface marker. The
left parotid and bilateral submandibular glands are unremarkable.

Thyroid: Unremarkable.

Lymph nodes: 2 nodules within the posterior right parotid gland as
described. Elsewhere within the neck, no pathologically enlarged
cervical chain lymph nodes are demonstrated.

Vascular: The major vascular structures of the neck are patent.
Atherosclerotic plaque within the carotid bifurcations and proximal
internal carotid arteries bilaterally.

Limited intracranial: No abnormality identified.

Visualized orbits: Unremarkable

Mastoids and visualized paranasal sinuses: No significant paranasal
sinus disease or mastoid effusion at the imaged levels.

Skeleton: No acute bony abnormality or aggressive osseous lesion.
Cervical spondylosis greatest at C6-C7. Trace C2-C3 grade 1
anterolisthesis.

Upper chest: No consolidation within the imaged lung apices.

Other: The temporomandibular joints are grossly unremarkable.

These results will be called to the ordering clinician or
representative by the Radiologist Assistant, and communication
documented in the PACS or [REDACTED].
IMPRESSION: Two nodules are present within the posterior right parotid gland
both measuring 10 mm. The more medial of the two nodules
demonstrates heterogeneous enhancement and is highly suspicious for
a primary parotid neoplasm. The more superficial of the two nodules
demonstrates homogeneous enhancement and may reflect a nonspecific
enlarged intraparotid lymph node or primary parotid neoplasm. ENT
consultation is recommended. Consider direct tissue sampling.

Subtle nonspecific asymmetric soft tissue prominence within the left
posterior nasopharynx. Direct visualization is recommended to
exclude a mass at this site.

The temporomandibular joints are grossly unremarkable.

Cervical spondylosis, greatest at C6-C7.

Atherosclerotic plaque within the carotid bifurcations and proximal
internal carotid arteries.

## 2021-11-04 ENCOUNTER — Telehealth (INDEPENDENT_AMBULATORY_CARE_PROVIDER_SITE_OTHER): Payer: Self-pay

## 2021-11-04 NOTE — Telephone Encounter (Signed)
She can come in with ABIs to see me or dew in the next week

## 2021-11-04 NOTE — Telephone Encounter (Signed)
Patient called in stating her right foot is turning dark since her last appt on 09/20/21 and she is having pain in her right leg as well. Patient has an upcoming ultrasound and office visit on 12/20/21. Please advise. Thank you

## 2021-11-08 ENCOUNTER — Ambulatory Visit (INDEPENDENT_AMBULATORY_CARE_PROVIDER_SITE_OTHER): Payer: Medicare Other | Admitting: Vascular Surgery

## 2021-11-08 ENCOUNTER — Other Ambulatory Visit: Payer: Self-pay

## 2021-11-08 ENCOUNTER — Encounter (INDEPENDENT_AMBULATORY_CARE_PROVIDER_SITE_OTHER): Payer: Self-pay | Admitting: Vascular Surgery

## 2021-11-08 ENCOUNTER — Other Ambulatory Visit (INDEPENDENT_AMBULATORY_CARE_PROVIDER_SITE_OTHER): Payer: Self-pay | Admitting: Nurse Practitioner

## 2021-11-08 ENCOUNTER — Ambulatory Visit (INDEPENDENT_AMBULATORY_CARE_PROVIDER_SITE_OTHER): Payer: Medicare Other

## 2021-11-08 VITALS — BP 138/84 | HR 91 | Resp 16 | Wt 233.0 lb

## 2021-11-08 DIAGNOSIS — E785 Hyperlipidemia, unspecified: Secondary | ICD-10-CM | POA: Diagnosis not present

## 2021-11-08 DIAGNOSIS — M79604 Pain in right leg: Secondary | ICD-10-CM

## 2021-11-08 DIAGNOSIS — Z9889 Other specified postprocedural states: Secondary | ICD-10-CM

## 2021-11-08 DIAGNOSIS — I739 Peripheral vascular disease, unspecified: Secondary | ICD-10-CM | POA: Diagnosis not present

## 2021-11-08 DIAGNOSIS — K55069 Acute infarction of intestine, part and extent unspecified: Secondary | ICD-10-CM | POA: Diagnosis not present

## 2021-11-08 DIAGNOSIS — I10 Essential (primary) hypertension: Secondary | ICD-10-CM | POA: Diagnosis not present

## 2021-11-08 DIAGNOSIS — K436 Other and unspecified ventral hernia with obstruction, without gangrene: Secondary | ICD-10-CM

## 2021-11-08 DIAGNOSIS — R7303 Prediabetes: Secondary | ICD-10-CM

## 2021-11-08 NOTE — Progress Notes (Signed)
MRN : [id number]  Carla [name] is a 61 y.o. (09-Mar-1961) female who presents with chief complaint of  Chief Complaint  Patient presents with   Follow-up    Ultrasound follow up  .  History of Present Illness: Patient returns today in follow up of her PAD earlier than her scheduled visit.  She returns earlier than her scheduled visit due to new onset pain and discoloration of her right foot.  This has been a recurring problem for her with multiple aortoiliac interventions over the past year or 2.  She says her left leg is not currently bothering her.  She does have pain in her right foot even at rest.  The right toes are discolored.  Particularly toes 2 and 3.  ABIs today have dropped on each side down to 0.93 on the right and 0.70 on the left.  Previously these were 1.08 and 0.87.  Current Outpatient Medications  Medication Sig Dispense Refill   albuterol (VENTOLIN HFA) 108 (90 Base) MCG/ACT inhaler Inhale 2 puffs into the lungs every 6 (six) hours as needed for wheezing or shortness of breath.     ALPRAZolam (XANAX) 0.5 MG tablet Take 1 tablet (0.5 mg total) by mouth 2 (two) times daily as needed for anxiety. 8 tablet 0   apixaban (ELIQUIS) 5 MG TABS tablet Take 1 tablet (5 mg total) by mouth 2 (two) times daily. 60 tablet 11   aspirin EC 81 MG tablet Take 81 mg by mouth daily. Swallow whole.     atorvastatin (LIPITOR) 80 MG tablet Take 80 mg by mouth at bedtime.     COMBIVENT RESPIMAT 20-100 MCG/ACT AERS respimat Inhale 1 puff into the lungs daily as needed for wheezing or shortness of breath.     ferrous sulfate 325 (65 FE) MG tablet Take 325 mg by mouth daily.     isosorbide mononitrate (IMDUR) 30 MG 24 hr tablet Take 15 mg by mouth daily.     metoprolol tartrate (LOPRESSOR) 50 MG tablet Take 1 tablet (50 mg total) by mouth 2 (two) times daily. 30 tablet 0   nitroGLYCERIN (NITROSTAT) 0.4 MG SL tablet Place 0.4 mg under the tongue as directed.     omeprazole (PRILOSEC) 20 MG capsule  Take 20 mg by mouth 2 (two) times daily before a meal.     pregabalin (LYRICA) 100 MG capsule Take 100 mg by mouth 3 (three) times daily.     traMADol (ULTRAM) 50 MG tablet Take 1 tablet (50 mg total) by mouth every 6 (six) hours as needed. (Patient not taking: Reported on 11/08/2021) 20 tablet 0   No current facility-administered medications for this visit.    Past Medical History:  Diagnosis Date   Abdominal aortic atherosclerosis ([place])    a. 05/2017 CTA abd/pelvis: significant atherosclerotic dzs of the infrarenal abd Ao w/ some mural thrombus. No aneurysm or dissection.   Acute focal ischemia of small intestine (HCC)    Acute right-sided low back pain with right-sided sciatica 06/13/2017   AKI (acute kidney injury) ([place]) 07/29/2017   Anginal pain ([place])    a. 08/2012 Lexiscan MV: EF 54%, non ischemia/infarct.   Anxiety and depression    Baker's cyst of knee, right    a. 07/2016 U/S: 4.1 x 1.4 x 2.9 cystic structure in R poplitetal fossa.   Bell's palsy    Borderline diabetes    CHF (congestive heart failure) (HCC)    Chronic anticoagulation    apixaban   Chronic  idiopathic constipation    Chronic mesenteric ischemia (HCC)    Chronic pain    COPD (chronic obstructive pulmonary disease) (HCC)    Diastolic dysfunction    a. 05/2017 Echo: Ef 60-65%, no rwma, Gr1 DD, no source of cardiac emboli.   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 05/2017 CTA Abd/pelvis: apparent thrombus or embolus in prox SMA (70-90%); b. 05/2017 catheter directed tPA, mechanical thrombectomy, and stenting of the SMA.   Essential hypertension with goal blood pressure less than 140/90 01/19/2016   Gastroesophageal reflux disease 01/19/2016   GERD (gastroesophageal reflux disease)    H/O colectomy    History of 2019 novel coronavirus disease (COVID-19) 11/14/2019   History of kidney stones    Hyperlipidemia    Hypertension    Morbid obesity with BMI of 40.0-44.9, adult ([place])    Occlusive  mesenteric ischemia ([place]) 06/19/2017   Osteoarthritis    PAD (peripheral artery disease) (HCC)    PAF (paroxysmal atrial fibrillation) (HCC)    Palpitations    Peripheral edema    Personal history of other malignant neoplasm of skin 06/01/2016   Pneumonia    Pre-diabetes    Primary osteoarthritis of both knees 06/13/2017   SBO (small bowel obstruction) ([place]) 08/06/2017   Skin cancer of nose    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HCC)    Vitamin D deficiency     Past Surgical History:  Procedure Laterality Date   AMPUTATION TOE Left 12/09/2019   Procedure: AMPUTATION TOE MPJ left;  Surgeon: Caroline More, DPM;  Location: [facility];  Service: Podiatry;  Laterality: Left;   APPENDECTOMY     CHOLECYSTECTOMY     COLON SURGERY     EMBOLECTOMY OR THROMBECTOMY, WITH OR WITHOUT CATHETER; FEMOROPOPLITEAL, AORTOILIAC ARTERY, BY LEG INCISION N/A 06/21/2020   Left - Decomp Fasciotomy Leg; Ant &/Or Lat Compart Only; Midline - Angiography, Visceral, Selective Or Supraselective (With Or Without Flush Aortogram); Left - Revascularize, Endovasc, Open/Percut, Iliac Artery, Unilat, Initial Vessel; W/Translum Stent, W/Angioplasty; Right - Revascularize, Endovasc, Open/Percut, Iliac Artery, Ea Add`L Ipsilateral; W/Translumin Stent, W/Angioplasty; Location [facility];   LAPAROTOMY N/A 08/08/2017   Procedure: EXPLORATORY LAPAROTOMY POSSIBLE BOWEL RESECTION;  Surgeon: Jules Husbands, MD;  Location: [facility];  Service: General;  Laterality: N/A;   LEFT HEART CATH AND CORONARY ANGIOGRAPHY N/A 05/17/2021   Procedure: LEFT HEART CATH AND CORONARY ANGIOGRAPHY;  Surgeon: Yolonda Kida, MD;  Location: [place] [facility];  Service: Cardiovascular;  Laterality: N/A;   LOWER EXTREMITY ANGIOGRAPHY Left 11/17/2019   Procedure: LOWER EXTREMITY ANGIOGRAPHY;  Surgeon: Algernon Huxley, MD;  Location: [place] [facility];  Service: Cardiovascular;  Laterality: Left;   LOWER EXTREMITY ANGIOGRAPHY Right 12/13/2020   Procedure:  LOWER EXTREMITY ANGIOGRAPHY;  Surgeon: Algernon Huxley, MD;  Location: [place] [facility];  Service: Cardiovascular;  Laterality: Right;   LOWER EXTREMITY ANGIOGRAPHY Right 01/10/2021   Procedure: LOWER EXTREMITY ANGIOGRAPHY;  Surgeon: Algernon Huxley, MD;  Location: [place] [facility];  Service: Cardiovascular;  Laterality: Right;   LOWER EXTREMITY ANGIOGRAPHY Right 01/24/2021   Procedure: LOWER EXTREMITY ANGIOGRAPHY;  Surgeon: Algernon Huxley, MD;  Location: [place] [facility];  Service: Cardiovascular;  Laterality: Right;   LOWER EXTREMITY ANGIOGRAPHY Bilateral 06/28/2021   Procedure: Lower Extremity Angiography;  Surgeon: Algernon Huxley, MD;  Location: [place] [facility];  Service: Cardiovascular;  Laterality: Bilateral;   LOWER EXTREMITY INTERVENTION N/A 11/19/2019   Procedure: LOWER EXTREMITY  INTERVENTION;  Surgeon: Algernon Huxley, MD;  Location: [place] [facility];  Service: Cardiovascular;  Laterality: N/A;   LOWER EXTREMITY INTERVENTION Bilateral 06/29/2021   Procedure: LOWER EXTREMITY INTERVENTION;  Surgeon: Algernon Huxley, MD;  Location: [place] [facility];  Service: Cardiovascular;  Laterality: Bilateral;   TEE WITHOUT CARDIOVERSION N/A 06/22/2017   Procedure: TRANSESOPHAGEAL ECHOCARDIOGRAM (TEE);  Surgeon: Wellington Hampshire, MD;  Location: [facility];  Service: Cardiovascular;  Laterality: N/A;   TOTAL KNEE ARTHROPLASTY Right 07/11/2018   Procedure: TOTAL KNEE ARTHROPLASTY;  Surgeon: Corky Mull, MD;  Location: [facility];  Service: Orthopedics;  Laterality: Right;   VAGINAL HYSTERECTOMY     VISCERAL ARTERY INTERVENTION N/A 06/20/2017   Procedure: Visceral Artery Intervention, possible aortic thrombectomy;  Surgeon: Algernon Huxley, MD;  Location: [place] [facility];  Service: Cardiovascular;  Laterality: N/A;   VISCERAL ARTERY INTERVENTION N/A 01/28/2018   Procedure: VISCERAL ARTERY INTERVENTION;  Surgeon: Algernon Huxley, MD;  Location: [place] [facility];  Service: Cardiovascular;   Laterality: N/A;     Social History   Tobacco Use   Smoking status: Former    Packs/day: 0.10    Types: Cigarettes   Smokeless tobacco: Never  Vaping Use   Vaping Use: Never used  Substance Use Topics   Alcohol use: No   Drug use: No       Family History  Problem Relation Age of Onset   Hypertension Mother    Heart disease Mother    Heart attack Mother    Breast cancer Mother    Hypertension Father    Breast cancer Paternal Aunt      Allergies  Allergen Reactions   Gabapentin     Tremors    Bactrim [Sulfamethoxazole-Trimethoprim] Itching    REVIEW OF SYSTEMS (Negative unless checked)   Constitutional: [] Weight loss  [] Fever  [] Chills Cardiac: [] Chest pain   [] Chest pressure   [] Palpitations   [] Shortness of breath when laying flat   [] Shortness of breath at rest   [] Shortness of breath with exertion. Vascular:  [] Pain in legs with walking   [] Pain in legs at rest   [] Pain in legs when laying flat   [] Claudication   [x] Pain in feet when walking  [x] Pain in feet at rest  [x] Pain in feet when laying flat   [] History of DVT   [] Phlebitis   [] Swelling in legs   [] Varicose veins   [] Non-healing ulcers Pulmonary:   [] Uses home oxygen   [] Productive cough   [] Hemoptysis   [] Wheeze  [] COPD   [] Asthma Neurologic:  [] Dizziness  [] Blackouts   [] Seizures   [] History of stroke   [] History of TIA  [] Aphasia   [] Temporary blindness   [] Dysphagia   [] Weakness or numbness in arms   [x] Weakness or numbness in legs Musculoskeletal:  [] Arthritis   [] Joint swelling   [] Joint pain   [] Low back pain Hematologic:  [] Easy bruising  [] Easy bleeding   [x] Hypercoagulable state   [] Anemic   Gastrointestinal:  [] Blood in stool   [] Vomiting blood  [] Gastroesophageal reflux/heartburn   [] Abdominal pain Genitourinary:  [] Chronic kidney disease   [] Difficult urination  [] Frequent urination  [] Burning with urination   [] Hematuria Skin:  [] Rashes   [] Ulcers   [] Wounds Psychological:  [] History of  anxiety   []  History of major depression.  Physical Examination  BP 138/84 (BP Location: Right Arm)    Pulse 91    Resp 16    Wt 233 lb (105.7 kg)  BMI 42.62 kg/m  Gen:  WD/WN, NAD. Obese.  Appears older than stated age Head: [place]/AT, No temporalis wasting. Ear/Nose/Throat: Hearing grossly intact, nares w/o erythema or drainage Eyes: Conjunctiva clear. Sclera non-icteric Neck: Supple.  Trachea midline Pulmonary:  Good air movement, no use of accessory muscles.  Cardiac: RRR, no JVD Vascular:  Vessel Right Left  Radial Palpable Palpable                          PT 1+ palpable Trace palpable  DP 1+ palpable 1+ palpable   Gastrointestinal: very large ventral hernia present Musculoskeletal: M/S 5/5 throughout.  No deformity or atrophy.  Mild bilateral lower extremity edema. Neurologic: Sensation grossly intact in extremities.  Symmetrical.  Speech is fluent.  Psychiatric: Judgment intact, Mood & affect appropriate for pt's clinical situation. Dermatologic: No rashes or ulcers noted.  No cellulitis or open wounds.      Labs No results found for this or any previous visit (from the past 2160 hour(s)).  Radiology No results found.  Assessment/Plan Essential hypertension with goal blood pressure less than 140/90 blood pressure control important in reducing the progression of atherosclerotic disease. On appropriate oral medications.     Prediabetes blood glucose control important in reducing the progression of atherosclerotic disease. Also, involved in wound healing. On appropriate medications.     Hyperlipidemia lipid control important in reducing the progression of atherosclerotic disease. 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 Would recommend Lovenox bridge with her significant clotting history.  PAD  (peripheral artery disease) (HCC) ABIs today have dropped on each side down to 0.93 on the right and 0.70 on the left.  Previously these were 1.08 and 0.87. Although her perfusion has not had a dramatic reduction, she has had so many thromboembolic issues before and is now having worrisome symptoms with a drop in her ABI that I do not believe we can ignore this.  I think an angiogram of the right lower extremity is indicated at this point with potential revascularization.  I told her we may not find true disease requiring treatment at this time, but I think it is likely we will.  I would like her to continue her Eliquis without cessation.  I would recommend as much activity as tolerated.  We will get her angiogram scheduled for the near future at her [place], [place]  11/08/2021 3:00 PM    This note was created with Dragon medical transcription system.  Any errors from dictation are purely unintentional

## 2021-11-08 NOTE — H&P (View-Only) (Signed)
MRN : [id number]  Carla [name] is a 61 y.o. (08-06-1961) female who presents with chief complaint of  Chief Complaint  Patient presents with   Follow-up    Ultrasound follow up  .  History of Present Illness: Patient returns today in follow up of her PAD earlier than her scheduled visit.  She returns earlier than her scheduled visit due to new onset pain and discoloration of her right foot.  This has been a recurring problem for her with multiple aortoiliac interventions over the past year or 2.  She says her left leg is not currently bothering her.  She does have pain in her right foot even at rest.  The right toes are discolored.  Particularly toes 2 and 3.  ABIs today have dropped on each side down to 0.93 on the right and 0.70 on the left.  Previously these were 1.08 and 0.87.  Current Outpatient Medications  Medication Sig Dispense Refill   albuterol (VENTOLIN HFA) 108 (90 Base) MCG/ACT inhaler Inhale 2 puffs into the lungs every 6 (six) hours as needed for wheezing or shortness of breath.     ALPRAZolam (XANAX) 0.5 MG tablet Take 1 tablet (0.5 mg total) by mouth 2 (two) times daily as needed for anxiety. 8 tablet 0   apixaban (ELIQUIS) 5 MG TABS tablet Take 1 tablet (5 mg total) by mouth 2 (two) times daily. 60 tablet 11   aspirin EC 81 MG tablet Take 81 mg by mouth daily. Swallow whole.     atorvastatin (LIPITOR) 80 MG tablet Take 80 mg by mouth at bedtime.     COMBIVENT RESPIMAT 20-100 MCG/ACT AERS respimat Inhale 1 puff into the lungs daily as needed for wheezing or shortness of breath.     ferrous sulfate 325 (65 FE) MG tablet Take 325 mg by mouth daily.     isosorbide mononitrate (IMDUR) 30 MG 24 hr tablet Take 15 mg by mouth daily.     metoprolol tartrate (LOPRESSOR) 50 MG tablet Take 1 tablet (50 mg total) by mouth 2 (two) times daily. 30 tablet 0   nitroGLYCERIN (NITROSTAT) 0.4 MG SL tablet Place 0.4 mg under the tongue as directed.     omeprazole (PRILOSEC) 20 MG capsule  Take 20 mg by mouth 2 (two) times daily before a meal.     pregabalin (LYRICA) 100 MG capsule Take 100 mg by mouth 3 (three) times daily.     traMADol (ULTRAM) 50 MG tablet Take 1 tablet (50 mg total) by mouth every 6 (six) hours as needed. (Patient not taking: Reported on 11/08/2021) 20 tablet 0   No current facility-administered medications for this visit.    Past Medical History:  Diagnosis Date   Abdominal aortic atherosclerosis ([place])    a. 05/2017 CTA abd/pelvis: significant atherosclerotic dzs of the infrarenal abd Ao w/ some mural thrombus. No aneurysm or dissection.   Acute focal ischemia of small intestine (HCC)    Acute right-sided low back pain with right-sided sciatica 06/13/2017   AKI (acute kidney injury) ([place]) 07/29/2017   Anginal pain ([place])    a. 08/2012 Lexiscan MV: EF 54%, non ischemia/infarct.   Anxiety and depression    Baker's cyst of knee, right    a. 07/2016 U/S: 4.1 x 1.4 x 2.9 cystic structure in R poplitetal fossa.   Bell's palsy    Borderline diabetes    CHF (congestive heart failure) (HCC)    Chronic anticoagulation    apixaban   Chronic  idiopathic constipation    Chronic mesenteric ischemia (HCC)    Chronic pain    COPD (chronic obstructive pulmonary disease) (HCC)    Diastolic dysfunction    a. 05/2017 Echo: Ef 60-65%, no rwma, Gr1 DD, no source of cardiac emboli.   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 05/2017 CTA Abd/pelvis: apparent thrombus or embolus in prox SMA (70-90%); b. 05/2017 catheter directed tPA, mechanical thrombectomy, and stenting of the SMA.   Essential hypertension with goal blood pressure less than 140/90 01/19/2016   Gastroesophageal reflux disease 01/19/2016   GERD (gastroesophageal reflux disease)    H/O colectomy    History of 2019 novel coronavirus disease (COVID-19) 11/14/2019   History of kidney stones    Hyperlipidemia    Hypertension    Morbid obesity with BMI of 40.0-44.9, adult ([place])    Occlusive  mesenteric ischemia ([place]) 06/19/2017   Osteoarthritis    PAD (peripheral artery disease) (HCC)    PAF (paroxysmal atrial fibrillation) (HCC)    Palpitations    Peripheral edema    Personal history of other malignant neoplasm of skin 06/01/2016   Pneumonia    Pre-diabetes    Primary osteoarthritis of both knees 06/13/2017   SBO (small bowel obstruction) ([place]) 08/06/2017   Skin cancer of nose    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HCC)    Vitamin D deficiency     Past Surgical History:  Procedure Laterality Date   AMPUTATION TOE Left 12/09/2019   Procedure: AMPUTATION TOE MPJ left;  Surgeon: Caroline More, DPM;  Location: [facility];  Service: Podiatry;  Laterality: Left;   APPENDECTOMY     CHOLECYSTECTOMY     COLON SURGERY     EMBOLECTOMY OR THROMBECTOMY, WITH OR WITHOUT CATHETER; FEMOROPOPLITEAL, AORTOILIAC ARTERY, BY LEG INCISION N/A 06/21/2020   Left - Decomp Fasciotomy Leg; Ant &/Or Lat Compart Only; Midline - Angiography, Visceral, Selective Or Supraselective (With Or Without Flush Aortogram); Left - Revascularize, Endovasc, Open/Percut, Iliac Artery, Unilat, Initial Vessel; W/Translum Stent, W/Angioplasty; Right - Revascularize, Endovasc, Open/Percut, Iliac Artery, Ea Add`L Ipsilateral; W/Translumin Stent, W/Angioplasty; Location [facility];   LAPAROTOMY N/A 08/08/2017   Procedure: EXPLORATORY LAPAROTOMY POSSIBLE BOWEL RESECTION;  Surgeon: Jules Husbands, MD;  Location: [facility];  Service: General;  Laterality: N/A;   LEFT HEART CATH AND CORONARY ANGIOGRAPHY N/A 05/17/2021   Procedure: LEFT HEART CATH AND CORONARY ANGIOGRAPHY;  Surgeon: Yolonda Kida, MD;  Location: [place] [facility];  Service: Cardiovascular;  Laterality: N/A;   LOWER EXTREMITY ANGIOGRAPHY Left 11/17/2019   Procedure: LOWER EXTREMITY ANGIOGRAPHY;  Surgeon: Algernon Huxley, MD;  Location: [place] [facility];  Service: Cardiovascular;  Laterality: Left;   LOWER EXTREMITY ANGIOGRAPHY Right 12/13/2020   Procedure:  LOWER EXTREMITY ANGIOGRAPHY;  Surgeon: Algernon Huxley, MD;  Location: [facility] [facility];  Service: Cardiovascular;  Laterality: Right;   LOWER EXTREMITY ANGIOGRAPHY Right 01/10/2021   Procedure: LOWER EXTREMITY ANGIOGRAPHY;  Surgeon: Algernon Huxley, MD;  Location: [facility];  Service: Cardiovascular;  Laterality: Right;   LOWER EXTREMITY ANGIOGRAPHY Right 01/24/2021   Procedure: LOWER EXTREMITY ANGIOGRAPHY;  Surgeon: Algernon Huxley, MD;  Location: [place] [facility];  Service: Cardiovascular;  Laterality: Right;   LOWER EXTREMITY ANGIOGRAPHY Bilateral 06/28/2021   Procedure: Lower Extremity Angiography;  Surgeon: Algernon Huxley, MD;  Location: [place];  Service: Cardiovascular;  Laterality: Bilateral;   LOWER EXTREMITY INTERVENTION N/A 11/19/2019   Procedure: LOWER EXTREMITY  INTERVENTION;  Surgeon: Algernon Huxley, MD;  Location: [place] [facility];  Service: Cardiovascular;  Laterality: N/A;   LOWER EXTREMITY INTERVENTION Bilateral 06/29/2021   Procedure: LOWER EXTREMITY INTERVENTION;  Surgeon: Algernon Huxley, MD;  Location: [place] [facility];  Service: Cardiovascular;  Laterality: Bilateral;   TEE WITHOUT CARDIOVERSION N/A 06/22/2017   Procedure: TRANSESOPHAGEAL ECHOCARDIOGRAM (TEE);  Surgeon: Wellington Hampshire, MD;  Location: [facility];  Service: Cardiovascular;  Laterality: N/A;   TOTAL KNEE ARTHROPLASTY Right 07/11/2018   Procedure: TOTAL KNEE ARTHROPLASTY;  Surgeon: Corky Mull, MD;  Location: [facility];  Service: Orthopedics;  Laterality: Right;   VAGINAL HYSTERECTOMY     VISCERAL ARTERY INTERVENTION N/A 06/20/2017   Procedure: Visceral Artery Intervention, possible aortic thrombectomy;  Surgeon: Algernon Huxley, MD;  Location: [place] [facility];  Service: Cardiovascular;  Laterality: N/A;   VISCERAL ARTERY INTERVENTION N/A 01/28/2018   Procedure: VISCERAL ARTERY INTERVENTION;  Surgeon: Algernon Huxley, MD;  Location: [place] [facility];  Service: Cardiovascular;   Laterality: N/A;     Social History   Tobacco Use   Smoking status: Former    Packs/day: 0.10    Types: Cigarettes   Smokeless tobacco: Never  Vaping Use   Vaping Use: Never used  Substance Use Topics   Alcohol use: No   Drug use: No       Family History  Problem Relation Age of Onset   Hypertension Mother    Heart disease Mother    Heart attack Mother    Breast cancer Mother    Hypertension Father    Breast cancer Paternal Aunt      Allergies  Allergen Reactions   Gabapentin     Tremors    Bactrim [Sulfamethoxazole-Trimethoprim] Itching    REVIEW OF SYSTEMS (Negative unless checked)   Constitutional: [] Weight loss  [] Fever  [] Chills Cardiac: [] Chest pain   [] Chest pressure   [] Palpitations   [] Shortness of breath when laying flat   [] Shortness of breath at rest   [] Shortness of breath with exertion. Vascular:  [] Pain in legs with walking   [] Pain in legs at rest   [] Pain in legs when laying flat   [] Claudication   [x] Pain in feet when walking  [x] Pain in feet at rest  [x] Pain in feet when laying flat   [] History of DVT   [] Phlebitis   [] Swelling in legs   [] Varicose veins   [] Non-healing ulcers Pulmonary:   [] Uses home oxygen   [] Productive cough   [] Hemoptysis   [] Wheeze  [] COPD   [] Asthma Neurologic:  [] Dizziness  [] Blackouts   [] Seizures   [] History of stroke   [] History of TIA  [] Aphasia   [] Temporary blindness   [] Dysphagia   [] Weakness or numbness in arms   [x] Weakness or numbness in legs Musculoskeletal:  [] Arthritis   [] Joint swelling   [] Joint pain   [] Low back pain Hematologic:  [] Easy bruising  [] Easy bleeding   [x] Hypercoagulable state   [] Anemic   Gastrointestinal:  [] Blood in stool   [] Vomiting blood  [] Gastroesophageal reflux/heartburn   [] Abdominal pain Genitourinary:  [] Chronic kidney disease   [] Difficult urination  [] Frequent urination  [] Burning with urination   [] Hematuria Skin:  [] Rashes   [] Ulcers   [] Wounds Psychological:  [] History of  anxiety   []  History of major depression.  Physical Examination  BP 138/84 (BP Location: Right Arm)    Pulse 91    Resp 16    Wt 233 lb (105.7 kg)  BMI 42.62 kg/m  Gen:  WD/WN, NAD. Obese.  Appears older than stated age Head: [place]/AT, No temporalis wasting. Ear/Nose/Throat: Hearing grossly intact, nares w/o erythema or drainage Eyes: Conjunctiva clear. Sclera non-icteric Neck: Supple.  Trachea midline Pulmonary:  Good air movement, no use of accessory muscles.  Cardiac: RRR, no JVD Vascular:  Vessel Right Left  Radial Palpable Palpable                          PT 1+ palpable Trace palpable  DP 1+ palpable 1+ palpable   Gastrointestinal: very large ventral hernia present Musculoskeletal: M/S 5/5 throughout.  No deformity or atrophy.  Mild bilateral lower extremity edema. Neurologic: Sensation grossly intact in extremities.  Symmetrical.  Speech is fluent.  Psychiatric: Judgment intact, Mood & affect appropriate for pt's clinical situation. Dermatologic: No rashes or ulcers noted.  No cellulitis or open wounds.      Labs No results found for this or any previous visit (from the past 2160 hour(s)).  Radiology No results found.  Assessment/Plan Essential hypertension with goal blood pressure less than 140/90 blood pressure control important in reducing the progression of atherosclerotic disease. On appropriate oral medications.     Prediabetes blood glucose control important in reducing the progression of atherosclerotic disease. Also, involved in wound healing. On appropriate medications.     Hyperlipidemia lipid control important in reducing the progression of atherosclerotic disease. 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 Would recommend Lovenox bridge with her significant clotting history.  PAD  (peripheral artery disease) (HCC) ABIs today have dropped on each side down to 0.93 on the right and 0.70 on the left.  Previously these were 1.08 and 0.87. Although her perfusion has not had a dramatic reduction, she has had so many thromboembolic issues before and is now having worrisome symptoms with a drop in her ABI that I do not believe we can ignore this.  I think an angiogram of the right lower extremity is indicated at this point with potential revascularization.  I told her we may not find true disease requiring treatment at this time, but I think it is likely we will.  I would like her to continue her Eliquis without cessation.  I would recommend as much activity as tolerated.  We will get her angiogram scheduled for the near future at her [place], [place]  11/08/2021 3:00 PM    This note was created with Dragon medical transcription system.  Any errors from dictation are purely unintentional

## 2021-11-08 NOTE — Patient Instructions (Signed)
Angiogram An angiogram is a procedure used to examine the blood vessels. In this procedure, contrast dye is injected through a long, thin tube (catheter) into an artery. X-rays are then taken, which show if there is a blockage or problem in a blood vessel. The catheter may be inserted in: The groin area. This is the most common. The fold of the arm, near the elbow. The wrist. Tell a health care provider about: Any allergies you have, including allergies to medicines, shellfish, contrast dye, or iodine. All medicines you are taking, including vitamins, herbs, eye drops, creams, and over-the-counter medicines. Any problems you or family members have had with anesthetic medicines. Any blood disorders you have. Any surgeries you have had. Any medical conditions you have or have had, including any kidney problems or kidney failure. Whether you are pregnant or may be pregnant. Whether you are breastfeeding. What are the risks? Generally, this is a safe procedure. However, problems may occur, including: Infection. Bleeding and bruising. Allergic reactions to medicines or dyes, including the contrast dye used. Damage to nearby structures or organs, including damage to blood vessels and kidney damage from the contrast dye. Blood clots that can lead to a stroke or heart attack. What happens before the procedure? Staying hydrated Follow instructions from your health care provider about hydration, which may include: Up to 2 hours before the procedure - you may continue to drink clear liquids, such as water, clear fruit juice, black coffee, and plain tea.  Eating and drinking restrictions Follow instructions from your health care provider about eating and drinking, which may include: 8 hours before the procedure - stop eating heavy meals or foods, such as meat, fried foods, or fatty foods. 6 hours before the procedure - stop eating light meals or foods, such as toast or cereal. 2 hours before the  procedure - stop drinking clear liquids. Medicines Ask your health care provider about: Changing or stopping your regular medicines. This is especially important if you are taking diabetes medicines or blood thinners. Taking medicines such as aspirin and ibuprofen. These medicines can thin your blood. Do not take these medicines unless your health care provider tells you to take them. Taking over-the-counter medicines, vitamins, herbs, and supplements. Surgery safety Ask your health care provider: How your insertion site will be marked. What steps will be taken to help prevent infection. These may include: Removing hair at the insertion site. Washing skin with a germ-killing soap. Taking antibiotic medicine. General instructions Do not use any products that contain nicotine or tobacco for at least 4 weeks before the procedure. These products include cigarettes, e-cigarettes, and chewing tobacco. If you need help quitting, ask your health care provider. You may have blood samples taken. Plan to have someone take you home from the hospital or clinic. If you will be going home right after the procedure, plan to have someone with you for 24 hours. What happens during the procedure? You will lie on your back on an X-ray table. You may be strapped to the table if it is tilted. An IV will be inserted into one of your veins. Electrodes may be placed on your chest to monitor your heart rate during the procedure. You will be given one or both of the following: A medicine to help you relax (sedative). A medicine to numb the area where the catheter will be inserted (local anesthetic). A small incision will be made for catheter insertion. The catheter will be inserted into an artery using a guide  wire. An X-ray procedure (fluoroscopy) will be used to help guide the catheter to the blood vessel to be examined. A contrast dye will then be injected into the catheter, and X-rays will be taken. The contrast  will help to show where any narrowing or blockages are located in the blood vessels. You may feel flushed as the contrast dye is injected. Tell your health care provider if you develop chest pain or trouble breathing. After the fluoroscopy is complete, the catheter will be removed. A bandage (dressing) will be placed over the site where the catheter was inserted. Pressure will be applied to help stop any bleeding. The IV will be removed. The procedure may vary among health care providers and hospitals. What happens after the procedure? Your blood pressure, heart rate, breathing rate, and blood oxygen level will be monitored until you leave the hospital or clinic. You will be kept in bed lying flat for 6 hours. If the catheter was inserted through your leg, you will be instructed not to bend or cross your legs. The insertion area and the pulse in your feet or wrist will be checked frequently. You will be instructed to drink plenty of fluids. This will help wash the contrast dye out of your body. You may have more blood tests and X-rays. You may also have a test that records the electrical activity of your heart (electrocardiogram, or ECG). Do not drive for 24 hours if you were given a sedative during your procedure. It is up to you to get the results of your procedure. Ask your health care provider, or the department that is doing the procedure, when your results will be ready. Summary An angiogram is a procedure used to examine the blood vessels. Before the procedure, follow your health care provider's instructions about eating and drinking restrictions. You may be asked to stop eating and drinking several hours before the procedure. During the procedure, contrast dye is injected through a thin tube (catheter) into an artery. X-rays are then taken. After the procedure, you will need to drink plenty of fluids and lie flat for 6 hour. This information is not intended to replace advice given to you  by your health care provider. Make sure you discuss any questions you have with your health care provider. Document Revised: 04/23/2019 Document Reviewed: 04/23/2019 Elsevier Patient Education  [place].

## 2021-11-08 NOTE — Assessment & Plan Note (Signed)
ABIs today have dropped on each side down to 0.93 on the right and 0.70 on the left.  Previously these were 1.08 and 0.87. Although Carla [name] perfusion has not had a dramatic reduction, she has had so many thromboembolic issues before and is now having worrisome symptoms with a drop in Carla [name] ABI that I do not believe we can ignore this.  I think an angiogram of the right lower extremity is indicated at this point with potential revascularization.  I told Carla [name] we may not find true disease requiring treatment at this time, but I think it is likely we will.  I would like Carla [name] to continue Carla [name] Eliquis without cessation.  I would recommend as much activity as tolerated.  We will get Carla [name] angiogram scheduled for the near future at Carla [name] convenience

## 2021-11-09 ENCOUNTER — Telehealth (INDEPENDENT_AMBULATORY_CARE_PROVIDER_SITE_OTHER): Payer: Self-pay | Admitting: Vascular Surgery

## 2021-11-11 DIAGNOSIS — I7090 Unspecified atherosclerosis: Secondary | ICD-10-CM | POA: Insufficient documentation

## 2021-11-11 DIAGNOSIS — K55069 Acute infarction of intestine, part and extent unspecified: Secondary | ICD-10-CM

## 2021-11-21 ENCOUNTER — Telehealth (INDEPENDENT_AMBULATORY_CARE_PROVIDER_SITE_OTHER): Payer: Self-pay

## 2021-11-21 ENCOUNTER — Other Ambulatory Visit (INDEPENDENT_AMBULATORY_CARE_PROVIDER_SITE_OTHER): Payer: Self-pay | Admitting: Nurse Practitioner

## 2021-11-21 MED ORDER — HYDROCODONE-ACETAMINOPHEN 5-325 MG PO TABS: 1.0000 | ORAL_TABLET | ORAL | 0 refills | Status: DC | PRN

## 2021-11-21 NOTE — Telephone Encounter (Signed)
Hydrocodone sent

## 2021-11-21 NOTE — Telephone Encounter (Signed)
Patient called in requesting information about a procedure she was supposed to get a phone call about setting up. Let patient know we are awaiting PA and someone will be in contact with her to schedule.   Patient also wanted to let Dr know she is in a lot of pain in her legs and wants to know if she can have some pain medication    Please call and advised

## 2021-11-21 NOTE — Telephone Encounter (Signed)
Patient has been made aware.

## 2021-11-22 ENCOUNTER — Telehealth (INDEPENDENT_AMBULATORY_CARE_PROVIDER_SITE_OTHER): Payer: Self-pay

## 2021-11-22 NOTE — Telephone Encounter (Signed)
Spoke with the patient and she is scheduled with Dr. Lucky Cowboy for a right leg angio on 11/28/21 with a 9:45 am arrival time to the [facility]. Pre-procedure instructions were discussed and will be mailed.

## 2021-11-24 IMAGING — US US BIOPSY FNA W/ IMAGING
1 series · 14 of 24 positions shown · non-contrast
Comparison: none

CLINICAL DATA: TMJ inflammation. Right parotid nodules incidentally
noted on CT neck.

[Series 1: us biopsy fna w/ imaging · 24 acquisitions, 14 frames shown]
[im 1/24]
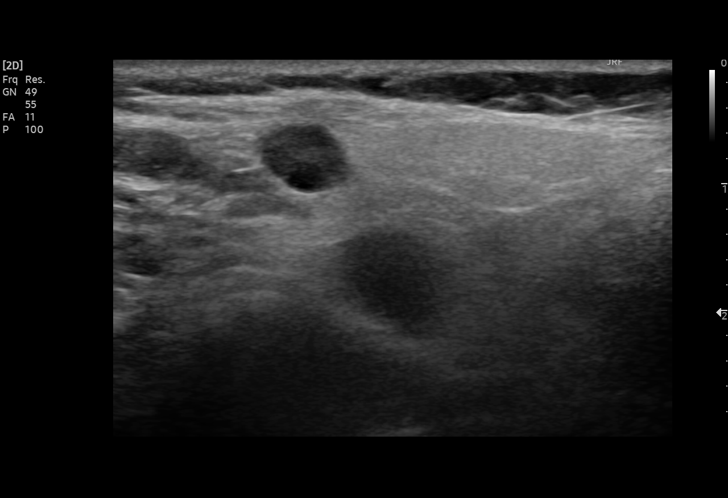
[im 3/24]
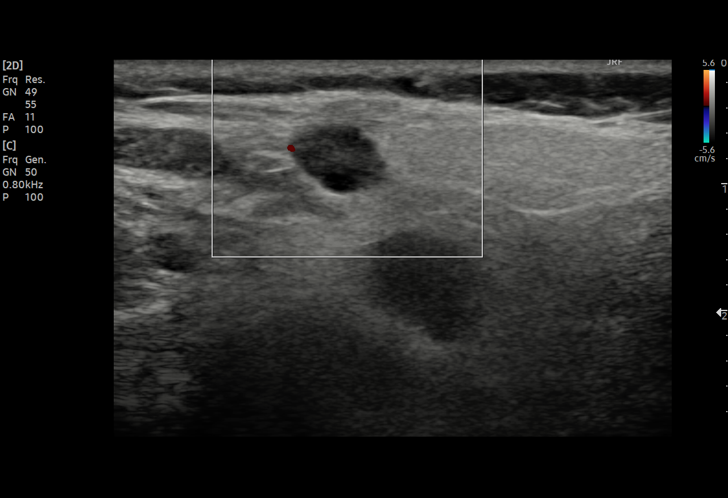
[im 5/24]
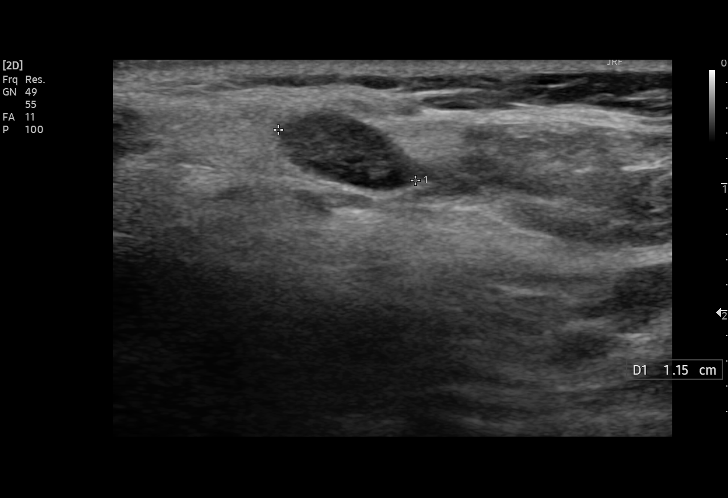
[im 7/24]
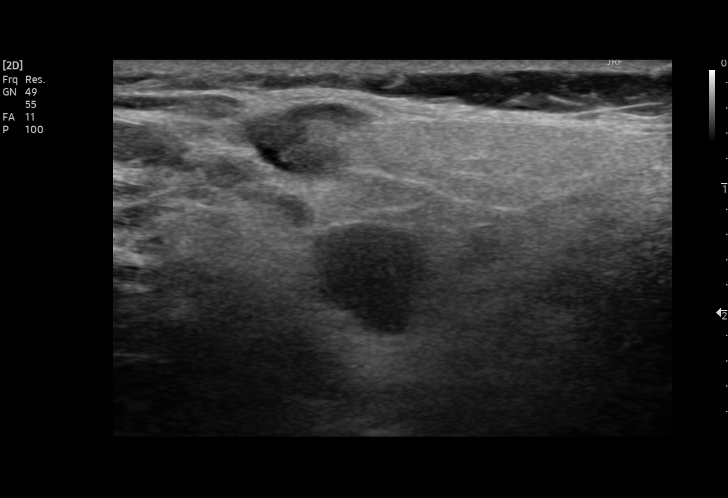
[im 8/24]
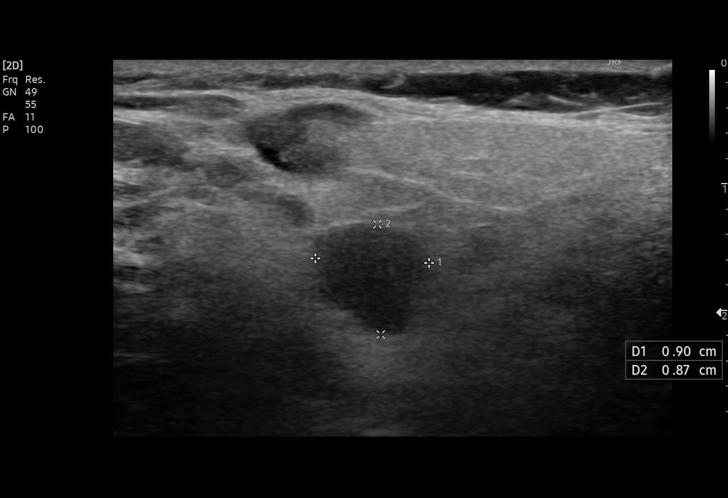
[im 10/24]
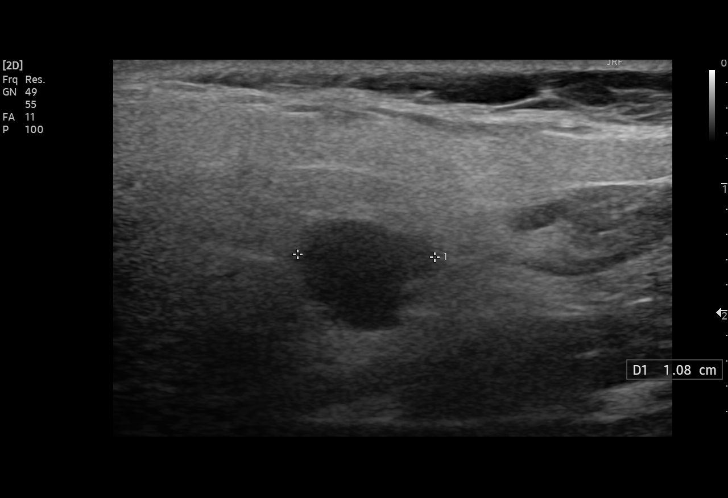
[im 12/24]
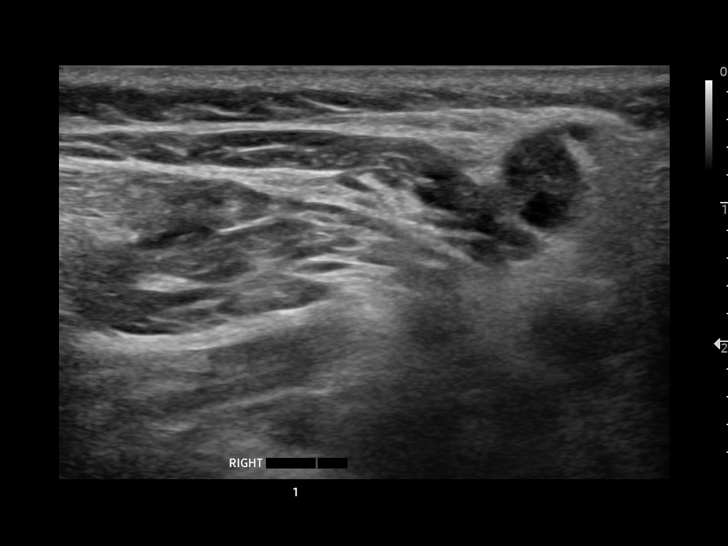
[im 13/24]
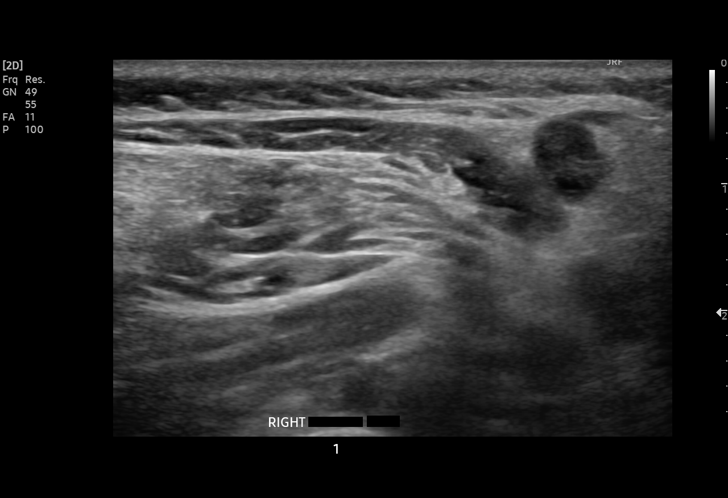
[im 15/24]
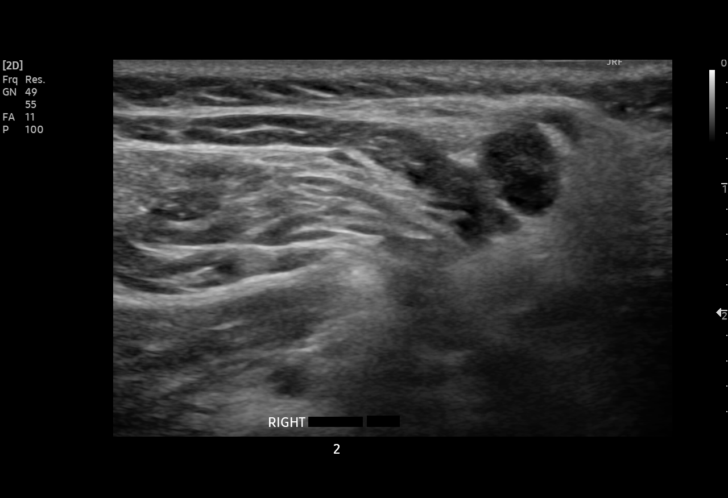
[im 17/24]
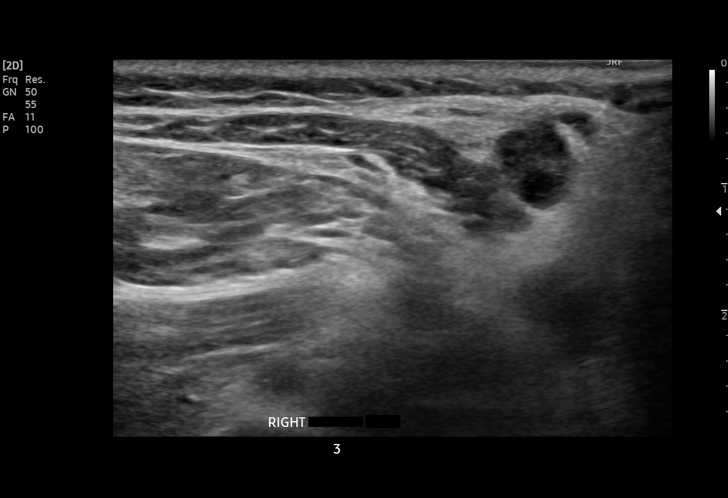
[im 19/24]
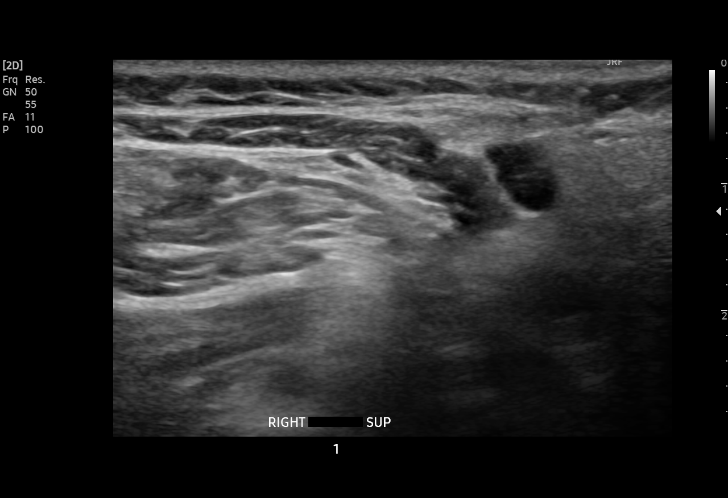
[im 20/24]
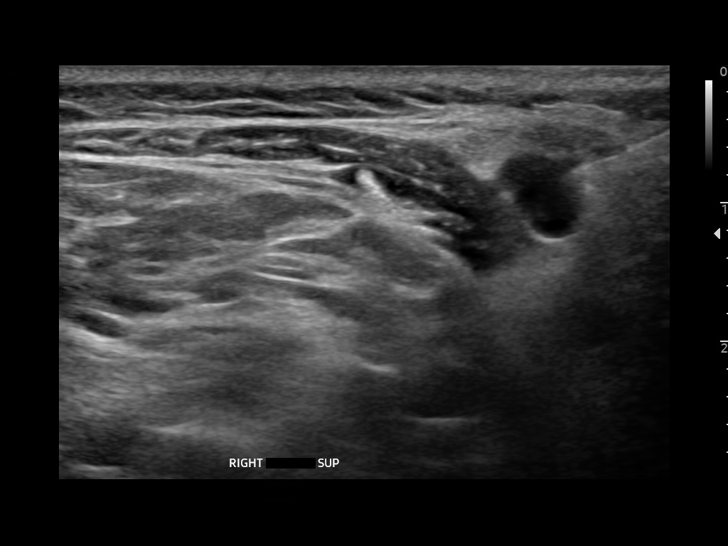
[im 22/24]
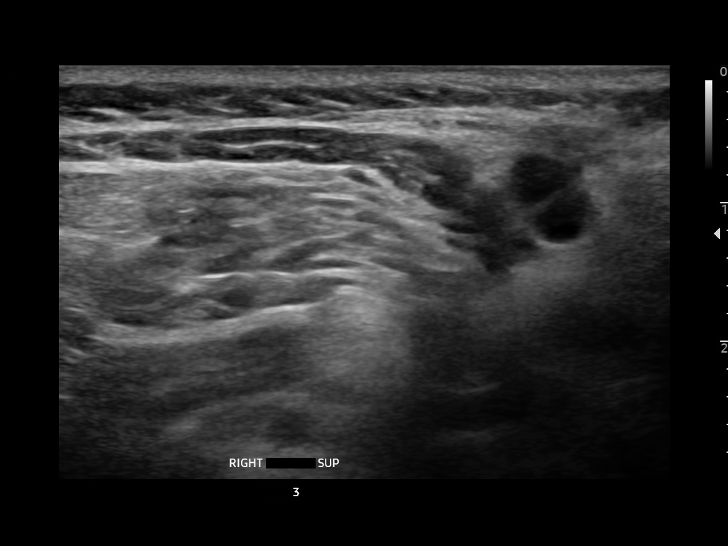
[im 24/24]
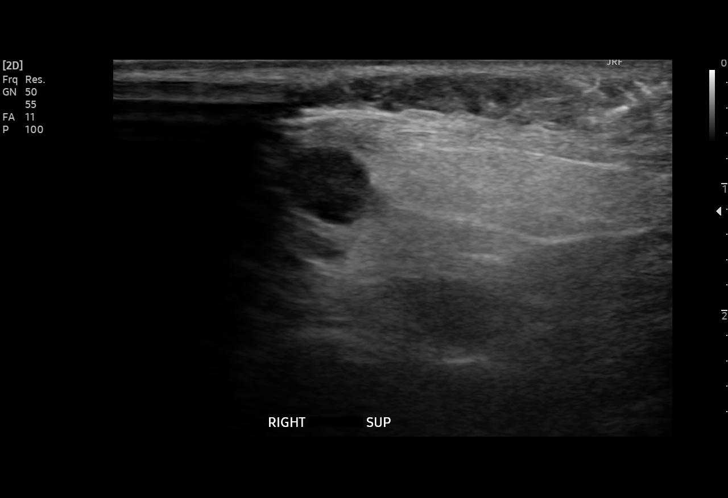

[14 of 24 positions shown; findings below may reference images not displayed]

EXAM:
ULTRASOUND GUIDED ASPIRATION BIOPSY OF DEEP RIGHT PAROTID NODULE

ULTRASOUND GUIDED ASPIRATION BIOPSY OF SUPERFICIAL RIGHT PAROTID
NODULE

MEDICATIONS:
Intravenous Fentanyl 19mcg and Versed 1.5mg were administered as
conscious sedation during continuous monitoring of the patient's
level of consciousness and physiological / cardiorespiratory status
by the radiology RN, with a total moderate sedation time of 14
minutes.

PROCEDURE:
The procedure, risks, benefits, and alternatives were explained to
the patient. Questions regarding the procedure were encouraged and
answered. The patient understands and consents to the procedure.

Survey ultrasound of the right parotid was performed. Hypoechoic
superficial and deep nodules were identified corresponding to CT
findings. Appropriate skin entry site determined and marked.

The operative field was prepped with chlorhexidine in a sterile
fashion, and a sterile drape was applied covering the operative
field. A sterile gown and sterile gloves were used for the
procedure. Local anesthesia was provided with 1% Lidocaine.

Under real-time ultrasound guidance, 3 25 gauge FNA samples of the
deep right parotid nodule were obtained.

Under real-time ultrasound guidance, 3 25 gauge FNA samples of the
superficial right parotid nodule were obtained.

Quick stain analysis by cytopathologist reported adequate specimens.

Postprocedure scans show no regional hematoma or other apparent
complication. The patient tolerated the procedure well.

COMPLICATIONS:
None.
FINDINGS: Hypoechoic right parotid superficial and deep nodules identified
corresponding to CT findings. Representative FNA sampling obtained
as above.
IMPRESSION: 1. Technically successful ultrasound-guided FNA biopsy, deep right
parotid nodule.
2. Technically successful ultrasound-guided FNA biopsy, superficial
right parotid nodule.

## 2021-11-28 ENCOUNTER — Other Ambulatory Visit (INDEPENDENT_AMBULATORY_CARE_PROVIDER_SITE_OTHER): Payer: Self-pay | Admitting: Nurse Practitioner

## 2021-11-28 ENCOUNTER — Ambulatory Visit
Admission: RE | Admit: 2021-11-28 | Discharge: 2021-11-28 | Disposition: A | Discharge status: Home / Self Care | Payer: Medicare Other | Source: Home / Self Care | Attending: Vascular Surgery | Admitting: Vascular Surgery

## 2021-11-28 ENCOUNTER — Encounter
Admission: RE | Disposition: A | Discharge status: Home / Self Care | Payer: Self-pay | Source: Home / Self Care | Attending: Vascular Surgery

## 2021-11-28 DIAGNOSIS — I10 Essential (primary) hypertension: Secondary | ICD-10-CM | POA: Insufficient documentation

## 2021-11-28 DIAGNOSIS — I70213 Atherosclerosis of native arteries of extremities with intermittent claudication, bilateral legs: Secondary | ICD-10-CM | POA: Insufficient documentation

## 2021-11-28 DIAGNOSIS — Z7901 Long term (current) use of anticoagulants: Secondary | ICD-10-CM | POA: Insufficient documentation

## 2021-11-28 DIAGNOSIS — I739 Peripheral vascular disease, unspecified: Secondary | ICD-10-CM

## 2021-11-28 DIAGNOSIS — I70211 Atherosclerosis of native arteries of extremities with intermittent claudication, right leg: Secondary | ICD-10-CM | POA: Diagnosis not present

## 2021-11-28 DIAGNOSIS — I70222 Atherosclerosis of native arteries of extremities with rest pain, left leg: Secondary | ICD-10-CM | POA: Insufficient documentation

## 2021-11-28 DIAGNOSIS — E785 Hyperlipidemia, unspecified: Secondary | ICD-10-CM | POA: Insufficient documentation

## 2021-11-28 DIAGNOSIS — R7303 Prediabetes: Secondary | ICD-10-CM | POA: Insufficient documentation

## 2021-11-28 DIAGNOSIS — K436 Other and unspecified ventral hernia with obstruction, without gangrene: Secondary | ICD-10-CM | POA: Insufficient documentation

## 2021-11-28 HISTORY — PX: LOWER EXTREMITY ANGIOGRAPHY: CATH118251

## 2021-11-28 LAB — CREATININE, SERUM
Creatinine, Ser: 0.96 mg/dL (ref 0.44–1.00)
GFR, Estimated: >60 mL/min (ref >60)

## 2021-11-28 LAB — BUN: BUN: 15 mg/dL (ref 8–23)

## 2021-11-28 SURGERY — LOWER EXTREMITY ANGIOGRAPHY
Anesthesia: Moderate Sedation | Site: Leg Lower | Laterality: Right

## 2021-11-28 MED ORDER — FAMOTIDINE 20 MG PO TABS: 40.0000 mg | ORAL_TABLET | Freq: Once | ORAL | Status: DC | PRN

## 2021-11-28 MED ORDER — CEFAZOLIN SODIUM-DEXTROSE 2-4 GM/100ML-% IV SOLN
INTRAVENOUS | Status: AC
  Administered 2021-11-28: 2 g via INTRAVENOUS
  Filled 2021-11-28: qty 100

## 2021-11-28 MED ORDER — FENTANYL CITRATE (PF) 100 MCG/2ML IJ SOLN
INTRAMUSCULAR | Status: DC | PRN
  Administered 2021-11-28: 25 ug via INTRAVENOUS
  Administered 2021-11-28: 50 ug via INTRAVENOUS
  Administered 2021-11-28: 25 ug via INTRAVENOUS

## 2021-11-28 MED ORDER — MIDAZOLAM HCL 2 MG/2ML IJ SOLN
INTRAMUSCULAR | Status: DC | PRN
  Administered 2021-11-28 (×2): 1 mg via INTRAVENOUS
  Administered 2021-11-28: 2 mg via INTRAVENOUS

## 2021-11-28 MED ORDER — DIPHENHYDRAMINE HCL 50 MG/ML IJ SOLN: 50.0000 mg | Freq: Once | INTRAMUSCULAR | Status: DC | PRN

## 2021-11-28 MED ORDER — HEPARIN SODIUM (PORCINE) 1000 UNIT/ML IJ SOLN
INTRAMUSCULAR | Status: AC
  Filled 2021-11-28: qty 10

## 2021-11-28 MED ORDER — SODIUM CHLORIDE 0.9 % IV SOLN: INTRAVENOUS | Status: DC

## 2021-11-28 MED ORDER — ONDANSETRON HCL 4 MG/2ML IJ SOLN: 4.0000 mg | Freq: Four times a day (QID) | INTRAMUSCULAR | Status: DC | PRN

## 2021-11-28 MED ORDER — HEPARIN SODIUM (PORCINE) 1000 UNIT/ML IJ SOLN
INTRAMUSCULAR | Status: DC | PRN
  Administered 2021-11-28: 5000 [IU] via INTRAVENOUS

## 2021-11-28 MED ORDER — FENTANYL CITRATE (PF) 100 MCG/2ML IJ SOLN
INTRAMUSCULAR | Status: AC
  Filled 2021-11-28: qty 2

## 2021-11-28 MED ORDER — METHYLPREDNISOLONE SODIUM SUCC 125 MG IJ SOLR: 125.0000 mg | Freq: Once | INTRAMUSCULAR | Status: DC | PRN

## 2021-11-28 MED ORDER — CEFAZOLIN SODIUM-DEXTROSE 2-4 GM/100ML-% IV SOLN: 2.0000 g | Freq: Once | INTRAVENOUS | Status: AC

## 2021-11-28 MED ORDER — HYDROMORPHONE HCL 1 MG/ML IJ SOLN: 1.0000 mg | Freq: Once | INTRAMUSCULAR | Status: DC | PRN

## 2021-11-28 MED ORDER — FENTANYL CITRATE PF 50 MCG/ML IJ SOSY
PREFILLED_SYRINGE | INTRAMUSCULAR | Status: AC
  Filled 2021-11-28: qty 1

## 2021-11-28 MED ORDER — MIDAZOLAM HCL 2 MG/2ML IJ SOLN
INTRAMUSCULAR | Status: AC
  Filled 2021-11-28: qty 4

## 2021-11-28 MED ORDER — MIDAZOLAM HCL 2 MG/ML PO SYRP: 8.0000 mg | ORAL_SOLUTION | Freq: Once | ORAL | Status: DC | PRN

## 2021-11-28 MED ORDER — MIDAZOLAM HCL 2 MG/2ML IJ SOLN
INTRAMUSCULAR | Status: AC
  Filled 2021-11-28: qty 2

## 2021-11-28 SURGICAL SUPPLY — 18 items
BALLN ULTRVRSE 7X100X75 (BALLOONS) ×2
BALLN ULTRVRSE 7X40X75C (BALLOONS) ×2
BALLOON ULTRVRSE 7X100X75 (BALLOONS) IMPLANT
BALLOON ULTRVRSE 7X40X75C (BALLOONS) IMPLANT
CANISTER PENUMBRA ENGINE (MISCELLANEOUS) ×1 IMPLANT
CANNULA 5F STIFF (CANNULA) ×1 IMPLANT
CATH ANGIO 5F PIGTAIL 65CM (CATHETERS) ×1 IMPLANT
CATH INDIGO 7D KIT (CATHETERS) ×1 IMPLANT
COVER PROBE U/S 5X48 (MISCELLANEOUS) ×1 IMPLANT
DEVICE STARCLOSE SE CLOSURE (Vascular Products) ×2 IMPLANT
GLIDEWIRE ADV .035X180CM (WIRE) ×1 IMPLANT
KIT ENCORE 26 ADVANTAGE (KITS) ×2 IMPLANT
PACK ANGIOGRAPHY (CUSTOM PROCEDURE TRAY) ×2 IMPLANT
SHEATH BRITE TIP 5FRX11 (SHEATH) ×2 IMPLANT
SHEATH BRITE TIP 7FRX11 (SHEATH) ×1 IMPLANT
SYR MEDRAD MARK 7 150ML (SYRINGE) ×1 IMPLANT
TUBING CONTRAST HIGH PRESS 72 (TUBING) ×1 IMPLANT
WIRE GUIDERIGHT .035X150 (WIRE) ×1 IMPLANT

## 2021-11-28 NOTE — Op Note (Signed)
[facility]  Percutaneous Study/Intervention Procedural Note   Date of Surgery: 11/28/2021  Surgeon(s):Maritza Goldsborough    Assistants:none  Pre-operative Diagnosis: PAD with rest [name] on the left and claudication on the right  Post-operative diagnosis:  Same  Procedure(s) Performed:             1.  Ultrasound guidance for vascular access bilateral femoral arteries             2.  Catheter placement into aorta from bilateral femoral approaches             3.  Aortogram and bilateral iliofemoral angiograms             4.  Percutaneous transluminal angioplasty of right common iliac artery with 7 mm diameter angioplasty balloon             5.  Mechanical thrombectomy of the left external and common iliac arteries with the penumbra CAT 7D device  6.  Percutaneous transluminal angioplasty of the left common iliac artery with 7 mm diameter angioplasty balloon             7.  StarClose closure device lateral femoral arteries  EBL: 150 cc  Contrast: 45 cc  Fluoro Time: 5.6 minutes  Moderate Conscious Sedation Time: approximately 60 minutes using 4 mg of Versed and 100 mcg of Fentanyl              Indications:  Patient is a 61 y.o.female with a long history of severe peripheral arterial disease with multiple aortoiliac interventions now with rest [name] on the left and numbness in both feet as well as claudication on the right.The patient is brought in for angiography for further evaluation and potential treatment.  Due to the limb threatening nature of the situation, angiogram was performed for attempted limb salvage. The patient is aware that if the procedure fails, amputation would be expected.  The patient also understands that even with successful revascularization, amputation may still be required due to the severity of the situation.  Risks and benefits are discussed and informed consent is obtained.   Procedure:  The patient was identified and appropriate procedural time  out was performed.  The patient was then placed supine on the table and prepped and draped in the usual sterile fashion. Moderate conscious sedation was administered during a face to face encounter with the patient throughout the procedure with my supervision of the RN administering medicines and monitoring the patient's vital signs, pulse oximetry, telemetry and mental status throughout from the start of the procedure until the patient was taken to the recovery room. Ultrasound was used to evaluate the left common femoral artery.  It was patent .  A digital ultrasound image was acquired.  A Seldinger needle was used to access the left common femoral artery under direct ultrasound guidance and a permanent image was performed.  A 0.035 J wire was advanced without resistance and a 5Fr sheath was placed.  Pigtail catheter was placed into the aorta and an AP aortogram was performed. This demonstrated normal renal arteries and normal aorta down to the distal segment where the previously placed stents were.  The left iliac stent was occluded and there appeared to be some narrowing at the top of the right common iliac stent that was terminating in the distal aorta.  The thrombus tracked down on the left down to the external iliac artery where there was reconstitution in both common femoral arteries appear to be patent.  The right external iliac artery was widely patent.  It was felt that it was in the patient's best interest to proceed with intervention after these images to avoid a second procedure and a larger amount of contrast and fluoroscopy based off of the findings from the initial angiogram. The patient was systemically heparinized and I accessed the right femoral artery with a micropuncture needle under direct ultrasound guidance and accessed the right femoral artery without difficulty with a micropuncture needle.  Micropuncture wire and sheath were placed and a permanent image was recorded.  We upsized to a 5  [place] sheath right.  7 mm diameter by 4 cm length angioplasty balloon was then inflated in the right common iliac artery at the area of narrowing at the top of the stent not only to treat this narrowing but to protect the right side we performed thrombectomy on the left.  We upsized to a 7 [place] sheath on the left side and used the penumbra CAT D device making 3 passes in the left external and common iliac arteries up into the aorta to remove the thrombus.  This did result in a channel of blood flow although there appeared to be significant narrowing in the left common iliac artery stent.  I then replaced the advantage wire in the left side and used a 7 mm diameter by 8 cm length angioplasty balloon and inflated this on the left side concomitant to the inflation on the right side in a kissing balloon fashion.  I then replaced the pigtail catheter back into the aorta and completed imaging.  This demonstrated both common iliac and external iliac arteries now to be patent with good flow down to both common femoral arteries.  The narrowing of the distal aorta at the top of both previously placed stents also appear to be resolved and there was brisk flow distally.  I elected to terminate the procedure. The sheath was removed and StarClose closure device was deployed in the right femoral artery with excellent hemostatic result.  Similarly, the sheath was removed and StarClose closure device was deployed on the left side with excellent hemostatic result.  The patient was taken to the recovery room in stable condition having tolerated the procedure well.  Findings:               Aortogram:  This demonstrated normal renal arteries and normal aorta down to the distal segment where the previously placed stents were.  The left iliac stent was occluded and there appeared to be some narrowing at the top of the right common iliac stent that was terminating in the distal aorta.  The thrombus tracked down on the left down to  the external iliac artery where there was reconstitution in both common femoral arteries appear to be patent.  The right external iliac artery was widely patent.        Disposition: Patient was taken to the recovery room in stable condition having tolerated the procedure well.  Complications: None  Carla [name] 11/28/2021 12:37 PM   This note was created with Dragon Medical transcription system. Any errors in dictation are purely unintentional.

## 2021-11-28 NOTE — Progress Notes (Signed)
Patient sitting up and states she doesn't want anything to eat. No bleeding noted at either femoral site.

## 2021-11-28 NOTE — Interval H&P Note (Signed)
History and Physical Interval Note:  11/28/2021 9:50 AM  Carla [name]  has presented today for surgery, with the diagnosis of RLE Angio  BARD   ASO w rest pain.  The various methods of treatment have been discussed with the patient and family. After consideration of risks, benefits and other options for treatment, the patient has consented to  Procedure(s): LOWER EXTREMITY ANGIOGRAPHY (Right) as a surgical intervention.  The patient's history has been reviewed, patient examined, no change in status, stable for surgery.  I have reviewed the patient's chart and labs.  Questions were answered to the patient's satisfaction.     [name]

## 2021-11-29 ENCOUNTER — Encounter: Payer: Self-pay | Admitting: Vascular Surgery

## 2021-11-29 ENCOUNTER — Ambulatory Visit (INDEPENDENT_AMBULATORY_CARE_PROVIDER_SITE_OTHER): Payer: Medicare Other | Admitting: Nurse Practitioner

## 2021-11-29 ENCOUNTER — Other Ambulatory Visit: Payer: Self-pay

## 2021-11-29 ENCOUNTER — Ambulatory Visit (INDEPENDENT_AMBULATORY_CARE_PROVIDER_SITE_OTHER): Payer: Medicare Other

## 2021-11-29 ENCOUNTER — Other Ambulatory Visit (INDEPENDENT_AMBULATORY_CARE_PROVIDER_SITE_OTHER): Payer: Self-pay | Admitting: Nurse Practitioner

## 2021-11-29 VITALS — BP 146/85 | HR 98 | Resp 16

## 2021-11-29 DIAGNOSIS — M79606 Pain in leg, unspecified: Secondary | ICD-10-CM

## 2021-11-29 DIAGNOSIS — I10 Essential (primary) hypertension: Secondary | ICD-10-CM

## 2021-11-29 DIAGNOSIS — Z95820 Peripheral vascular angioplasty status with implants and grafts: Secondary | ICD-10-CM | POA: Diagnosis not present

## 2021-11-29 DIAGNOSIS — E785 Hyperlipidemia, unspecified: Secondary | ICD-10-CM

## 2021-11-29 DIAGNOSIS — I70229 Atherosclerosis of native arteries of extremities with rest pain, unspecified extremity: Secondary | ICD-10-CM

## 2021-11-29 MED ORDER — HYDROCODONE-ACETAMINOPHEN 5-325 MG PO TABS: 1.0000 | ORAL_TABLET | ORAL | 0 refills | Status: DC | PRN

## 2021-11-29 NOTE — H&P (View-Only) (Signed)
Subjective:    Patient ID: Carla [name], female    DOB: 10/14/1961, 61 y.o.   MRN: [id number] Chief Complaint  Patient presents with   Follow-up    Ultrasound results    Carla [name] is a 61 year old female that presents to the office today for follow-up evaluation for left leg pain and coldness.  The patient underwent left lower extremity intervention on 11/28/2021 including:  Procedure(s) Performed:             1.  Ultrasound guidance for vascular access bilateral femoral arteries             2.  Catheter placement into aorta from bilateral femoral approaches             3.  Aortogram and bilateral iliofemoral angiograms             4.  Percutaneous transluminal angioplasty of right common iliac artery with 7 mm diameter angioplasty balloon             5.  Mechanical thrombectomy of the left external and common iliac arteries with the penumbra CAT 7D device             6.  Percutaneous transluminal angioplasty of the left common iliac artery with 7 mm diameter angioplasty balloon             7.  StarClose closure device lateral femoral arteries  The patient noted that her leg swelled immediately after the procedure however several hours after returning home she notes that she had pain and coolness to her leg.  She notes that when she went to bed her leg ached all night and she began to have discoloration in her toes.  Today noninvasive studies show absent flow in the toe with dampened monophasic waveforms in the tibial arteries.  This indicates likely reocclusion of the recent intervention   Review of Systems  Skin:  Positive for color change.  All other systems reviewed and are negative.     Objective:   Physical Exam Vitals reviewed.  HENT:     Head: Normocephalic.  Cardiovascular:     Rate and Rhythm: Normal rate.  Pulmonary:     Effort: Pulmonary effort is normal.  Skin:    General: Skin is cool.     Coloration: Skin is cyanotic and mottled.  Neurological:     Mental  Status: She is alert and oriented to person, place, and time.  Psychiatric:        Mood and Affect: Mood normal.        Behavior: Behavior normal.        Thought Content: Thought content normal.        Judgment: Judgment normal.    BP (!) 146/85 (BP Location: Right Arm)    Pulse 98    Resp 16   Past Medical History:  Diagnosis Date   Abdominal aortic atherosclerosis ([place])    a. 05/2017 CTA abd/pelvis: significant atherosclerotic dzs of the infrarenal abd Ao w/ some mural thrombus. No aneurysm or dissection.   Acute focal ischemia of small intestine (HCC)    Acute right-sided low back pain with right-sided sciatica 06/13/2017   AKI (acute kidney injury) ([place]) 07/29/2017   Anginal pain ([place])    a. 08/2012 Lexiscan MV: EF 54%, non ischemia/infarct.   Anxiety and depression    Baker's cyst of knee, right    a. 07/2016 U/S: 4.1 x 1.4 x 2.9 cystic structure in R poplitetal fossa.  Bell's palsy    Borderline diabetes    CHF (congestive heart failure) (HCC)    Chronic anticoagulation    apixaban   Chronic idiopathic constipation    Chronic mesenteric ischemia (HCC)    Chronic pain    COPD (chronic obstructive pulmonary disease) (HCC)    Diastolic dysfunction    a. 05/2017 Echo: Ef 60-65%, no rwma, Gr1 DD, no source of cardiac emboli.   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 05/2017 CTA Abd/pelvis: apparent thrombus or embolus in prox SMA (70-90%); b. 05/2017 catheter directed tPA, mechanical thrombectomy, and stenting of the SMA.   Essential hypertension with goal blood pressure less than 140/90 01/19/2016   Gastroesophageal reflux disease 01/19/2016   GERD (gastroesophageal reflux disease)    H/O colectomy    History of 2019 novel coronavirus disease (COVID-19) 11/14/2019   History of kidney stones    Hyperlipidemia    Hypertension    Morbid obesity with BMI of 40.0-44.9, adult ([place])    Occlusive mesenteric ischemia ([place]) 06/19/2017   Osteoarthritis    PAD  (peripheral artery disease) (HCC)    PAF (paroxysmal atrial fibrillation) (HCC)    Palpitations    Peripheral edema    Personal history of other malignant neoplasm of skin 06/01/2016   Pneumonia    Pre-diabetes    Primary osteoarthritis of both knees 06/13/2017   SBO (small bowel obstruction) ([place]) 08/06/2017   Skin cancer of nose    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HCC)    Vitamin D deficiency     Social History   Socioeconomic History   Marital status: Significant Other    Spouse name: Merchant navy officer    Number of children: Not on file   Years of education: Not on file   Highest education level: Not on file  Occupational History   Occupation: Unemployed  Tobacco Use   Smoking status: Former    Packs/day: 0.10    Types: Cigarettes   Smokeless tobacco: Never  Vaping Use   Vaping Use: Never used  Substance and Sexual Activity   Alcohol use: No   Drug use: No   Sexual activity: Not on file  Other Topics Concern   Not on file  Social History Narrative   Lives in [place] with boyfriend.  Does not routinely exercise.   Social Determinants of Health   Financial Resource Strain: Not on file  Food Insecurity: Not on file  Transportation Needs: Not on file  Physical Activity: Not on file  Stress: Not on file  Social Connections: Not on file  Intimate Partner Violence: Not on file    Past Surgical History:  Procedure Laterality Date   AMPUTATION TOE Left 12/09/2019   Procedure: AMPUTATION TOE MPJ left;  Surgeon: Caroline More, DPM;  Location: [facility];  Service: Podiatry;  Laterality: Left;   APPENDECTOMY     CHOLECYSTECTOMY     COLON SURGERY     EMBOLECTOMY OR THROMBECTOMY, WITH OR WITHOUT CATHETER; FEMOROPOPLITEAL, AORTOILIAC ARTERY, BY LEG INCISION N/A 06/21/2020   Left - Decomp Fasciotomy Leg; Ant &/Or Lat Compart Only; Midline - Angiography, Visceral, Selective Or Supraselective (With Or Without Flush Aortogram); Left - Revascularize, Endovasc, Open/Percut,  Iliac Artery, Unilat, Initial Vessel; W/Translum Stent, W/Angioplasty; Right - Revascularize, Endovasc, Open/Percut, Iliac Artery, Ea Add`L Ipsilateral; W/Translumin Stent, W/Angioplasty; Location [facility];   LAPAROTOMY N/A 08/08/2017   Procedure: EXPLORATORY LAPAROTOMY POSSIBLE BOWEL RESECTION;  Surgeon: Jules Husbands, MD;  Location: [facility];  Service: General;  Laterality: N/A;   LEFT HEART CATH AND CORONARY ANGIOGRAPHY N/A 05/17/2021   Procedure: LEFT HEART CATH AND CORONARY ANGIOGRAPHY;  Surgeon: Yolonda Kida, MD;  Location: [facility];  Service: Cardiovascular;  Laterality: N/A;   LOWER EXTREMITY ANGIOGRAPHY Left 11/17/2019   Procedure: LOWER EXTREMITY ANGIOGRAPHY;  Surgeon: Algernon Huxley, MD;  Location: [facility];  Service: Cardiovascular;  Laterality: Left;   LOWER EXTREMITY ANGIOGRAPHY Right 12/13/2020   Procedure: LOWER EXTREMITY ANGIOGRAPHY;  Surgeon: Algernon Huxley, MD;  Location: [facility];  Service: Cardiovascular;  Laterality: Right;   LOWER EXTREMITY ANGIOGRAPHY Right 01/10/2021   Procedure: LOWER EXTREMITY ANGIOGRAPHY;  Surgeon: Algernon Huxley, MD;  Location: [facility];  Service: Cardiovascular;  Laterality: Right;   LOWER EXTREMITY ANGIOGRAPHY Right 01/24/2021   Procedure: LOWER EXTREMITY ANGIOGRAPHY;  Surgeon: Algernon Huxley, MD;  Location: [facility];  Service: Cardiovascular;  Laterality: Right;   LOWER EXTREMITY ANGIOGRAPHY Bilateral 06/28/2021   Procedure: Lower Extremity Angiography;  Surgeon: Algernon Huxley, MD;  Location: [facility];  Service: Cardiovascular;  Laterality: Bilateral;   LOWER EXTREMITY ANGIOGRAPHY Right 11/28/2021   Procedure: LOWER EXTREMITY ANGIOGRAPHY;  Surgeon: Algernon Huxley, MD;  Location: [facility];  Service: Cardiovascular;  Laterality: Right;   LOWER EXTREMITY INTERVENTION N/A 11/19/2019   Procedure: LOWER EXTREMITY INTERVENTION;  Surgeon: Algernon Huxley, MD;  Location: [facility];   Service: Cardiovascular;  Laterality: N/A;   LOWER EXTREMITY INTERVENTION Bilateral 06/29/2021   Procedure: LOWER EXTREMITY INTERVENTION;  Surgeon: Algernon Huxley, MD;  Location: [facility];  Service: Cardiovascular;  Laterality: Bilateral;   TEE WITHOUT CARDIOVERSION N/A 06/22/2017   Procedure: TRANSESOPHAGEAL ECHOCARDIOGRAM (TEE);  Surgeon: Wellington Hampshire, MD;  Location: [facility];  Service: Cardiovascular;  Laterality: N/A;   TOTAL KNEE ARTHROPLASTY Right 07/11/2018   Procedure: TOTAL KNEE ARTHROPLASTY;  Surgeon: Corky Mull, MD;  Location: [facility];  Service: Orthopedics;  Laterality: Right;   VAGINAL HYSTERECTOMY     VISCERAL ARTERY INTERVENTION N/A 06/20/2017   Procedure: Visceral Artery Intervention, possible aortic thrombectomy;  Surgeon: Algernon Huxley, MD;  Location: [facility];  Service: Cardiovascular;  Laterality: N/A;   VISCERAL ARTERY INTERVENTION N/A 01/28/2018   Procedure: VISCERAL ARTERY INTERVENTION;  Surgeon: Algernon Huxley, MD;  Location: [facility];  Service: Cardiovascular;  Laterality: N/A;    Family History  Problem Relation Age of Onset   Hypertension Mother    Heart disease Mother    Heart attack Mother    Breast cancer Mother    Hypertension Father    Breast cancer Paternal Aunt     Allergies  Allergen Reactions   Gabapentin     Tremors    Bactrim [Sulfamethoxazole-Trimethoprim] Itching    CBC Latest Ref Rng & Units 06/30/2021 06/29/2021 06/28/2021  WBC 4.0 - 10.5 K/uL 8.8 8.4 8.8  Hemoglobin 12.0 - 15.0 g/dL 12.5 13.0 12.8  Hematocrit 36.0 - 46.0 % 37.0 38.7 38.9  Platelets 150 - 400 K/uL 164 160 179      CMP     Component Value Date/Time   NA 137 06/29/2021 0548   NA 140 02/12/2015 1137   K 4.3 06/29/2021 0548   K 2.9 (L) 02/12/2015 1137   CL 101 06/29/2021 0548   CL 107 02/12/2015 1137   CO2 27 06/29/2021 0548   CO2 27 02/12/2015 1137   GLUCOSE  112 (H) 06/29/2021 0548   GLUCOSE 120 (H) 02/12/2015 1137   BUN 15  11/28/2021 1034   BUN 15 02/12/2015 1137   CREATININE 0.96 11/28/2021 1034   CREATININE 0.78 02/12/2015 1137   CALCIUM 8.6 (L) 06/29/2021 0548   CALCIUM 9.0 02/12/2015 1137   PROT 7.0 09/30/2020 1124   PROT 7.0 09/14/2020 1139   PROT 7.1 01/19/2015 1112   ALBUMIN 3.3 (L) 09/30/2020 1124   ALBUMIN 3.9 09/14/2020 1139   ALBUMIN 3.8 01/19/2015 1112   AST 15 09/30/2020 1124   AST 17 01/19/2015 1112   ALT 13 09/30/2020 1124   ALT 11 (L) 01/19/2015 1112   ALKPHOS 89 09/30/2020 1124   ALKPHOS 84 01/19/2015 1112   BILITOT 0.5 09/30/2020 1124   BILITOT <0.2 09/14/2020 1139   BILITOT 0.5 01/19/2015 1112   GFRNONAA >60 11/28/2021 1034   GFRNONAA >60 02/12/2015 1137   GFRAA >60 11/23/2019 1330   GFRAA >60 02/12/2015 1137     VAS [place] ABI WITH/WO TBI  Result Date: 11/16/2021  [place] STUDY Patient Name:  Carla [name]  Date of Exam:   11/08/2021 Medical Rec #: [id number]      Accession #:    [id number] Date of Birth: 08/27/1961       Patient Gender: F Patient Age:   24 years Exam Location:  [facility] Procedure:      VAS [place] ABI WITH/WO TBI Referring Phys: [name] --------------------------------------------------------------------------------  Indications: Peripheral artery disease.  Vascular Interventions: 12/13/20: right cia,eia thrombectomy                         01/10/21: rt thrombectomy new bilat kissing stents in                         cia's, addtl rt eia stent;                         01/24/21: repeat thrombectomy right;                         06/29/21: Right CIA/EIA thrombectomy with bilateral CIA                         PTAs;. Comparison Study: 09/20/2021 Performing Technologist: Almira Coaster RVS  Examination Guidelines: A complete evaluation includes at minimum, Doppler waveform signals and systolic blood pressure reading at the level of bilateral brachial, anterior tibial, and posterior tibial arteries, when vessel segments are accessible. Bilateral testing is  considered an integral part of a complete examination. Photoelectric Plethysmograph (PPG) waveforms and toe systolic pressure readings are included as required and additional duplex testing as needed. Limited examinations for reoccurring indications may be performed as noted.  ABI Findings: +--------+------------------+-----+---------+--------+  Right    Rt Pressure (mmHg) Index Waveform  Comment   +--------+------------------+-----+---------+--------+  Brachial 162                                          +--------+------------------+-----+---------+--------+  ATA      150                      triphasic .93       +--------+------------------+-----+---------+--------+  PTA  109                0.67  biphasic            +--------+------------------+-----+---------+--------+ +--------+------------------+-----+--------+-------+  Left     Lt Pressure (mmHg) Index Waveform Comment  +--------+------------------+-----+--------+-------+  Brachial 157                                        +--------+------------------+-----+--------+-------+  ATA      98                       biphasic .60      +--------+------------------+-----+--------+-------+  PTA      114                0.70  biphasic          +--------+------------------+-----+--------+-------+ +-------+-----------+-----------+------------+------------+  ABI/TBI Today's ABI Today's TBI Previous ABI Previous TBI  +-------+-----------+-----------+------------+------------+  Right   .93                     1.08         .73           +-------+-----------+-----------+------------+------------+  Left    .70                     .87          .67           +-------+-----------+-----------+------------+------------+  Bilateral ABIs appear decreased compared to prior study on 09/20/2021. Patient to be reminded of bringing Children to an appointment; Results not completely obtained.  Summary: Right: Resting right ankle-brachial index indicates mild right lower extremity arterial  disease. Unable to obtain TBIs due to Patient's Family situation with [facility] interferring with Exam. Left: Resting left ankle-brachial index indicates moderate left lower extremity arterial disease. Unable to obtain TBIs due to Patient's Family situation with [facility] interferring with Exam.  *See table(s) above for measurements and observations.  Electronically signed by [name] MD on 11/16/2021 at 10:46:38 AM.    Final        Assessment & Plan:   1. 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  This represents a limb threatening ischemia and places the patient at the risk for limb loss.  Patient should undergo angiography of the lower extremities with the hope for intervention for limb salvage.  The risks and benefits as well as the alternative therapies was discussed in detail with the patient.  All questions were answered.  Patient agrees to proceed with angiography.  The patient will follow up with me in the office after the procedure.       2. Hyperlipidemia, unspecified hyperlipidemia type Continue statin as ordered and reviewed, no changes at this time   3. Essential hypertension with goal blood pressure less than 140/90 Continue antihypertensive medications as already ordered, these medications have been reviewed and there are no changes at this time.    Current Outpatient Medications on File Prior to Visit  Medication Sig Dispense Refill   albuterol (VENTOLIN HFA) 108 (90 Base) MCG/ACT inhaler Inhale 2 puffs into the lungs every 6 (six) hours as needed  for wheezing or shortness of breath.     ALPRAZolam (XANAX) 0.5 MG tablet Take 1 tablet (0.5 mg total) by mouth 2 (two) times daily as needed for anxiety. 8 tablet 0    apixaban (ELIQUIS) 5 MG TABS tablet Take 1 tablet (5 mg total) by mouth 2 (two) times daily. 60 tablet 11   aspirin EC 81 MG tablet Take 81 mg by mouth daily. Swallow whole.     atorvastatin (LIPITOR) 80 MG tablet Take 80 mg by mouth at bedtime.     COMBIVENT RESPIMAT 20-100 MCG/ACT AERS respimat Inhale 1 puff into the lungs daily as needed for wheezing or shortness of breath.     ferrous sulfate 325 (65 FE) MG tablet Take 325 mg by mouth daily.     isosorbide mononitrate (IMDUR) 30 MG 24 hr tablet Take 15 mg by mouth daily.     metoprolol tartrate (LOPRESSOR) 50 MG tablet Take 1 tablet (50 mg total) by mouth 2 (two) times daily. 30 tablet 0   nitroGLYCERIN (NITROSTAT) 0.4 MG SL tablet Place 0.4 mg under the tongue as directed.     omeprazole (PRILOSEC) 20 MG capsule Take 20 mg by mouth 2 (two) times daily before a meal.     pregabalin (LYRICA) 100 MG capsule Take 100 mg by mouth 3 (three) times daily.     traMADol (ULTRAM) 50 MG tablet Take 1 tablet (50 mg total) by mouth every 6 (six) hours as needed. (Patient not taking: Reported on 11/08/2021) 20 tablet 0   No current facility-administered medications on file prior to visit.    There are no Patient Instructions on file for this visit. No follow-ups on file.   Kris Hartmann, NP

## 2021-11-29 NOTE — Progress Notes (Signed)
Subjective:    Patient ID: Carla [name], female    DOB: 1960/10/29, 61 y.o.   MRN: [id number] Chief Complaint  Patient presents with   Follow-up    Ultrasound results    Carla [name] is a 61 year old female that presents to the office today for follow-up evaluation for left leg pain and coldness.  The patient underwent left lower extremity intervention on 11/28/2021 including:  Procedure(s) Performed:             1.  Ultrasound guidance for vascular access bilateral femoral arteries             2.  Catheter placement into aorta from bilateral femoral approaches             3.  Aortogram and bilateral iliofemoral angiograms             4.  Percutaneous transluminal angioplasty of right common iliac artery with 7 mm diameter angioplasty balloon             5.  Mechanical thrombectomy of the left external and common iliac arteries with the penumbra CAT 7D device             6.  Percutaneous transluminal angioplasty of the left common iliac artery with 7 mm diameter angioplasty balloon             7.  StarClose closure device lateral femoral arteries  The patient noted that her leg swelled immediately after the procedure however several hours after returning home she notes that she had pain and coolness to her leg.  She notes that when she went to bed her leg ached all night and she began to have discoloration in her toes.  Today noninvasive studies show absent flow in the toe with dampened monophasic waveforms in the tibial arteries.  This indicates likely reocclusion of the recent intervention   Review of Systems  Skin:  Positive for color change.  All other systems reviewed and are negative.     Objective:   Physical Exam Vitals reviewed.  HENT:     Head: Normocephalic.  Cardiovascular:     Rate and Rhythm: Normal rate.  Pulmonary:     Effort: Pulmonary effort is normal.  Skin:    General: Skin is cool.     Coloration: Skin is cyanotic and mottled.  Neurological:     Mental  Status: She is alert and oriented to person, place, and time.  Psychiatric:        Mood and Affect: Mood normal.        Behavior: Behavior normal.        Thought Content: Thought content normal.        Judgment: Judgment normal.    BP (!) 146/85 (BP Location: Right Arm)    Pulse 98    Resp 16   Past Medical History:  Diagnosis Date   Abdominal aortic atherosclerosis ([place])    a. 05/2017 CTA abd/pelvis: significant atherosclerotic dzs of the infrarenal abd Ao w/ some mural thrombus. No aneurysm or dissection.   Acute focal ischemia of small intestine (HCC)    Acute right-sided low back pain with right-sided sciatica 06/13/2017   AKI (acute kidney injury) ([place]) 07/29/2017   Anginal pain ([place])    a. 08/2012 Lexiscan MV: EF 54%, non ischemia/infarct.   Anxiety and depression    Baker's cyst of knee, right    a. 07/2016 U/S: 4.1 x 1.4 x 2.9 cystic structure in R poplitetal fossa.  Bell's palsy    Borderline diabetes    CHF (congestive heart failure) (HCC)    Chronic anticoagulation    apixaban   Chronic idiopathic constipation    Chronic mesenteric ischemia (HCC)    Chronic pain    COPD (chronic obstructive pulmonary disease) (HCC)    Diastolic dysfunction    a. 05/2017 Echo: Ef 60-65%, no rwma, Gr1 DD, no source of cardiac emboli.   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 05/2017 CTA Abd/pelvis: apparent thrombus or embolus in prox SMA (70-90%); b. 05/2017 catheter directed tPA, mechanical thrombectomy, and stenting of the SMA.   Essential hypertension with goal blood pressure less than 140/90 01/19/2016   Gastroesophageal reflux disease 01/19/2016   GERD (gastroesophageal reflux disease)    H/O colectomy    History of 2019 novel coronavirus disease (COVID-19) 11/14/2019   History of kidney stones    Hyperlipidemia    Hypertension    Morbid obesity with BMI of 40.0-44.9, adult ([place])    Occlusive mesenteric ischemia ([place]) 06/19/2017   Osteoarthritis    PAD  (peripheral artery disease) (HCC)    PAF (paroxysmal atrial fibrillation) (HCC)    Palpitations    Peripheral edema    Personal history of other malignant neoplasm of skin 06/01/2016   Pneumonia    Pre-diabetes    Primary osteoarthritis of both knees 06/13/2017   SBO (small bowel obstruction) ([place]) 08/06/2017   Skin cancer of nose    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HCC)    Vitamin D deficiency     Social History   Socioeconomic History   Marital status: Significant Other    Spouse name: Merchant navy officer    Number of children: Not on file   Years of education: Not on file   Highest education level: Not on file  Occupational History   Occupation: Unemployed  Tobacco Use   Smoking status: Former    Packs/day: 0.10    Types: Cigarettes   Smokeless tobacco: Never  Vaping Use   Vaping Use: Never used  Substance and Sexual Activity   Alcohol use: No   Drug use: No   Sexual activity: Not on file  Other Topics Concern   Not on file  Social History Narrative   Lives in [place] with boyfriend.  Does not routinely exercise.   Social Determinants of Health   Financial Resource Strain: Not on file  Food Insecurity: Not on file  Transportation Needs: Not on file  Physical Activity: Not on file  Stress: Not on file  Social Connections: Not on file  Intimate Partner Violence: Not on file    Past Surgical History:  Procedure Laterality Date   AMPUTATION TOE Left 12/09/2019   Procedure: AMPUTATION TOE MPJ left;  Surgeon: Caroline More, DPM;  Location: [facility];  Service: Podiatry;  Laterality: Left;   APPENDECTOMY     CHOLECYSTECTOMY     COLON SURGERY     EMBOLECTOMY OR THROMBECTOMY, WITH OR WITHOUT CATHETER; FEMOROPOPLITEAL, AORTOILIAC ARTERY, BY LEG INCISION N/A 06/21/2020   Left - Decomp Fasciotomy Leg; Ant &/Or Lat Compart Only; Midline - Angiography, Visceral, Selective Or Supraselective (With Or Without Flush Aortogram); Left - Revascularize, Endovasc, Open/Percut,  Iliac Artery, Unilat, Initial Vessel; W/Translum Stent, W/Angioplasty; Right - Revascularize, Endovasc, Open/Percut, Iliac Artery, Ea Add`L Ipsilateral; W/Translumin Stent, W/Angioplasty; Location [facility];   LAPAROTOMY N/A 08/08/2017   Procedure: EXPLORATORY LAPAROTOMY POSSIBLE BOWEL RESECTION;  Surgeon: Jules Husbands, MD;  Location: [facility];  Service: General;  Laterality: N/A;   LEFT HEART CATH AND CORONARY ANGIOGRAPHY N/A 05/17/2021   Procedure: LEFT HEART CATH AND CORONARY ANGIOGRAPHY;  Surgeon: Yolonda Kida, MD;  Location: [place];  Service: Cardiovascular;  Laterality: N/A;   LOWER EXTREMITY ANGIOGRAPHY Left 11/17/2019   Procedure: LOWER EXTREMITY ANGIOGRAPHY;  Surgeon: Algernon Huxley, MD;  Location: [place] [facility];  Service: Cardiovascular;  Laterality: Left;   LOWER EXTREMITY ANGIOGRAPHY Right 12/13/2020   Procedure: LOWER EXTREMITY ANGIOGRAPHY;  Surgeon: Algernon Huxley, MD;  Location: [place] [facility];  Service: Cardiovascular;  Laterality: Right;   LOWER EXTREMITY ANGIOGRAPHY Right 01/10/2021   Procedure: LOWER EXTREMITY ANGIOGRAPHY;  Surgeon: Algernon Huxley, MD;  Location: [place] [facility];  Service: Cardiovascular;  Laterality: Right;   LOWER EXTREMITY ANGIOGRAPHY Right 01/24/2021   Procedure: LOWER EXTREMITY ANGIOGRAPHY;  Surgeon: Algernon Huxley, MD;  Location: [place] [facility];  Service: Cardiovascular;  Laterality: Right;   LOWER EXTREMITY ANGIOGRAPHY Bilateral 06/28/2021   Procedure: Lower Extremity Angiography;  Surgeon: Algernon Huxley, MD;  Location: [place] [facility];  Service: Cardiovascular;  Laterality: Bilateral;   LOWER EXTREMITY ANGIOGRAPHY Right 11/28/2021   Procedure: LOWER EXTREMITY ANGIOGRAPHY;  Surgeon: Algernon Huxley, MD;  Location: [place] [facility];  Service: Cardiovascular;  Laterality: Right;   LOWER EXTREMITY INTERVENTION N/A 11/19/2019   Procedure: LOWER EXTREMITY INTERVENTION;  Surgeon: Algernon Huxley, MD;  Location: [place] [facility];   Service: Cardiovascular;  Laterality: N/A;   LOWER EXTREMITY INTERVENTION Bilateral 06/29/2021   Procedure: LOWER EXTREMITY INTERVENTION;  Surgeon: Algernon Huxley, MD;  Location: [place] [facility];  Service: Cardiovascular;  Laterality: Bilateral;   TEE WITHOUT CARDIOVERSION N/A 06/22/2017   Procedure: TRANSESOPHAGEAL ECHOCARDIOGRAM (TEE);  Surgeon: Wellington Hampshire, MD;  Location: [facility];  Service: Cardiovascular;  Laterality: N/A;   TOTAL KNEE ARTHROPLASTY Right 07/11/2018   Procedure: TOTAL KNEE ARTHROPLASTY;  Surgeon: Corky Mull, MD;  Location: [facility];  Service: Orthopedics;  Laterality: Right;   VAGINAL HYSTERECTOMY     VISCERAL ARTERY INTERVENTION N/A 06/20/2017   Procedure: Visceral Artery Intervention, possible aortic thrombectomy;  Surgeon: Algernon Huxley, MD;  Location: [place] [facility];  Service: Cardiovascular;  Laterality: N/A;   VISCERAL ARTERY INTERVENTION N/A 01/28/2018   Procedure: VISCERAL ARTERY INTERVENTION;  Surgeon: Algernon Huxley, MD;  Location: [facility] [facility];  Service: Cardiovascular;  Laterality: N/A;    Family History  Problem Relation Age of Onset   Hypertension Mother    Heart disease Mother    Heart attack Mother    Breast cancer Mother    Hypertension Father    Breast cancer Paternal Aunt     Allergies  Allergen Reactions   Gabapentin     Tremors    Bactrim [Sulfamethoxazole-Trimethoprim] Itching    CBC Latest Ref Rng & Units 06/30/2021 06/29/2021 06/28/2021  WBC 4.0 - 10.5 K/uL 8.8 8.4 8.8  Hemoglobin 12.0 - 15.0 g/dL 12.5 13.0 12.8  Hematocrit 36.0 - 46.0 % 37.0 38.7 38.9  Platelets 150 - 400 K/uL 164 160 179      CMP     Component Value Date/Time   NA 137 06/29/2021 0548   NA 140 02/12/2015 1137   K 4.3 06/29/2021 0548   K 2.9 (L) 02/12/2015 1137   CL 101 06/29/2021 0548   CL 107 02/12/2015 1137   CO2 27 06/29/2021 0548   CO2 27 02/12/2015 1137   GLUCOSE  112 (H) 06/29/2021 0548   GLUCOSE 120 (H) 02/12/2015 1137   BUN 15  11/28/2021 1034   BUN 15 02/12/2015 1137   CREATININE 0.96 11/28/2021 1034   CREATININE 0.78 02/12/2015 1137   CALCIUM 8.6 (L) 06/29/2021 0548   CALCIUM 9.0 02/12/2015 1137   PROT 7.0 09/30/2020 1124   PROT 7.0 09/14/2020 1139   PROT 7.1 01/19/2015 1112   ALBUMIN 3.3 (L) 09/30/2020 1124   ALBUMIN 3.9 09/14/2020 1139   ALBUMIN 3.8 01/19/2015 1112   AST 15 09/30/2020 1124   AST 17 01/19/2015 1112   ALT 13 09/30/2020 1124   ALT 11 (L) 01/19/2015 1112   ALKPHOS 89 09/30/2020 1124   ALKPHOS 84 01/19/2015 1112   BILITOT 0.5 09/30/2020 1124   BILITOT <0.2 09/14/2020 1139   BILITOT 0.5 01/19/2015 1112   GFRNONAA >60 11/28/2021 1034   GFRNONAA >60 02/12/2015 1137   GFRAA >60 11/23/2019 1330   GFRAA >60 02/12/2015 1137     VAS [place] ABI WITH/WO TBI  Result Date: 11/16/2021  [facility] STUDY Patient Name:  CIELO ARIAS  Date of Exam:   11/08/2021 Medical Rec #: [id number]      Accession #:    [id number] Date of Birth: 1961-10-10       Patient Gender: F Patient Age:   68 years Exam Location:  [facility] Procedure:      VAS [place] ABI WITH/WO TBI Referring Phys: [name] --------------------------------------------------------------------------------  Indications: Peripheral artery disease.  Vascular Interventions: 12/13/20: right cia,eia thrombectomy                         01/10/21: rt thrombectomy new bilat kissing stents in                         cia's, addtl rt eia stent;                         01/24/21: repeat thrombectomy right;                         06/29/21: Right CIA/EIA thrombectomy with bilateral CIA                         PTAs;. Comparison Study: 09/20/2021 Performing Technologist: Almira Coaster RVS  Examination Guidelines: A complete evaluation includes at minimum, Doppler waveform signals and systolic blood pressure reading at the level of bilateral brachial, anterior tibial, and posterior tibial arteries, when vessel segments are accessible. Bilateral testing is  considered an integral part of a complete examination. Photoelectric Plethysmograph (PPG) waveforms and toe systolic pressure readings are included as required and additional duplex testing as needed. Limited examinations for reoccurring indications may be performed as noted.  ABI Findings: +--------+------------------+-----+---------+--------+  Right    Rt Pressure (mmHg) Index Waveform  Comment   +--------+------------------+-----+---------+--------+  Brachial 162                                          +--------+------------------+-----+---------+--------+  ATA      150                      triphasic .93       +--------+------------------+-----+---------+--------+  PTA  109                0.67  biphasic            +--------+------------------+-----+---------+--------+ +--------+------------------+-----+--------+-------+  Left     Lt Pressure (mmHg) Index Waveform Comment  +--------+------------------+-----+--------+-------+  Brachial 157                                        +--------+------------------+-----+--------+-------+  ATA      98                       biphasic .60      +--------+------------------+-----+--------+-------+  PTA      114                0.70  biphasic          +--------+------------------+-----+--------+-------+ +-------+-----------+-----------+------------+------------+  ABI/TBI Today's ABI Today's TBI Previous ABI Previous TBI  +-------+-----------+-----------+------------+------------+  Right   .93                     1.08         .73           +-------+-----------+-----------+------------+------------+  Left    .70                     .87          .67           +-------+-----------+-----------+------------+------------+  Bilateral ABIs appear decreased compared to prior study on 09/20/2021. Patient to be reminded of bringing Children to an appointment; Results not completely obtained.  Summary: Right: Resting right ankle-brachial index indicates mild right lower extremity arterial  disease. Unable to obtain TBIs due to Patient's Family situation with [facility] interferring with Exam. Left: Resting left ankle-brachial index indicates moderate left lower extremity arterial disease. Unable to obtain TBIs due to Patient's Family situation with [facility] interferring with Exam.  *See table(s) above for measurements and observations.  Electronically signed by [name] MD on 11/16/2021 at 10:46:38 AM.    Final        Assessment & Plan:   1. 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  This represents a limb threatening ischemia and places the patient at the risk for limb loss.  Patient should undergo angiography of the lower extremities with the hope for intervention for limb salvage.  The risks and benefits as well as the alternative therapies was discussed in detail with the patient.  All questions were answered.  Patient agrees to proceed with angiography.  The patient will follow up with me in the office after the procedure.       2. Hyperlipidemia, unspecified hyperlipidemia type Continue statin as ordered and reviewed, no changes at this time   3. Essential hypertension with goal blood pressure less than 140/90 Continue antihypertensive medications as already ordered, these medications have been reviewed and there are no changes at this time.    Current Outpatient Medications on File Prior to Visit  Medication Sig Dispense Refill   albuterol (VENTOLIN HFA) 108 (90 Base) MCG/ACT inhaler Inhale 2 puffs into the lungs every 6 (six) hours as needed  for wheezing or shortness of breath.     ALPRAZolam (XANAX) 0.5 MG tablet Take 1 tablet (0.5 mg total) by mouth 2 (two) times daily as needed for anxiety. 8 tablet 0    apixaban (ELIQUIS) 5 MG TABS tablet Take 1 tablet (5 mg total) by mouth 2 (two) times daily. 60 tablet 11   aspirin EC 81 MG tablet Take 81 mg by mouth daily. Swallow whole.     atorvastatin (LIPITOR) 80 MG tablet Take 80 mg by mouth at bedtime.     COMBIVENT RESPIMAT 20-100 MCG/ACT AERS respimat Inhale 1 puff into the lungs daily as needed for wheezing or shortness of breath.     ferrous sulfate 325 (65 FE) MG tablet Take 325 mg by mouth daily.     isosorbide mononitrate (IMDUR) 30 MG 24 hr tablet Take 15 mg by mouth daily.     metoprolol tartrate (LOPRESSOR) 50 MG tablet Take 1 tablet (50 mg total) by mouth 2 (two) times daily. 30 tablet 0   nitroGLYCERIN (NITROSTAT) 0.4 MG SL tablet Place 0.4 mg under the tongue as directed.     omeprazole (PRILOSEC) 20 MG capsule Take 20 mg by mouth 2 (two) times daily before a meal.     pregabalin (LYRICA) 100 MG capsule Take 100 mg by mouth 3 (three) times daily.     traMADol (ULTRAM) 50 MG tablet Take 1 tablet (50 mg total) by mouth every 6 (six) hours as needed. (Patient not taking: Reported on 11/08/2021) 20 tablet 0   No current facility-administered medications on file prior to visit.    There are no Patient Instructions on file for this visit. No follow-ups on file.   Kris Hartmann, NP

## 2021-11-30 ENCOUNTER — Inpatient Hospital Stay
Admission: AD | Admit: 2021-11-30 | Discharge: 2021-12-03 | DRG: 271 | Disposition: A | Discharge status: Home / Self Care | Payer: Medicare Other | Source: Ambulatory Visit | Attending: Vascular Surgery | Admitting: Vascular Surgery

## 2021-11-30 ENCOUNTER — Encounter
Admission: AD | Disposition: A | Discharge status: Home / Self Care | Payer: Self-pay | Source: Ambulatory Visit | Attending: Vascular Surgery

## 2021-11-30 ENCOUNTER — Other Ambulatory Visit (INDEPENDENT_AMBULATORY_CARE_PROVIDER_SITE_OTHER): Payer: Self-pay | Admitting: Nurse Practitioner

## 2021-11-30 ENCOUNTER — Encounter: Payer: Self-pay | Admitting: Vascular Surgery

## 2021-11-30 DIAGNOSIS — I5032 Chronic diastolic (congestive) heart failure: Secondary | ICD-10-CM | POA: Diagnosis present

## 2021-11-30 DIAGNOSIS — I70201 Unspecified atherosclerosis of native arteries of extremities, right leg: Secondary | ICD-10-CM | POA: Diagnosis not present

## 2021-11-30 DIAGNOSIS — R7303 Prediabetes: Secondary | ICD-10-CM | POA: Diagnosis present

## 2021-11-30 DIAGNOSIS — I110 Hypertensive heart disease with heart failure: Secondary | ICD-10-CM | POA: Diagnosis present

## 2021-11-30 DIAGNOSIS — M79671 Pain in right foot: Secondary | ICD-10-CM | POA: Diagnosis present

## 2021-11-30 DIAGNOSIS — E785 Hyperlipidemia, unspecified: Secondary | ICD-10-CM | POA: Diagnosis present

## 2021-11-30 DIAGNOSIS — Z7982 Long term (current) use of aspirin: Secondary | ICD-10-CM | POA: Diagnosis not present

## 2021-11-30 DIAGNOSIS — E559 Vitamin D deficiency, unspecified: Secondary | ICD-10-CM | POA: Diagnosis present

## 2021-11-30 DIAGNOSIS — Z79899 Other long term (current) drug therapy: Secondary | ICD-10-CM

## 2021-11-30 DIAGNOSIS — K5904 Chronic idiopathic constipation: Secondary | ICD-10-CM | POA: Diagnosis present

## 2021-11-30 DIAGNOSIS — Z87891 Personal history of nicotine dependence: Secondary | ICD-10-CM

## 2021-11-30 DIAGNOSIS — I700 Atherosclerosis of aorta: Secondary | ICD-10-CM | POA: Diagnosis present

## 2021-11-30 DIAGNOSIS — D689 Coagulation defect, unspecified: Secondary | ICD-10-CM | POA: Diagnosis present

## 2021-11-30 DIAGNOSIS — K219 Gastro-esophageal reflux disease without esophagitis: Secondary | ICD-10-CM | POA: Diagnosis present

## 2021-11-30 DIAGNOSIS — I70222 Atherosclerosis of native arteries of extremities with rest pain, left leg: Secondary | ICD-10-CM | POA: Diagnosis present

## 2021-11-30 DIAGNOSIS — K436 Other and unspecified ventral hernia with obstruction, without gangrene: Secondary | ICD-10-CM | POA: Diagnosis present

## 2021-11-30 DIAGNOSIS — Z96651 Presence of right artificial knee joint: Secondary | ICD-10-CM | POA: Diagnosis present

## 2021-11-30 DIAGNOSIS — K551 Chronic vascular disorders of intestine: Secondary | ICD-10-CM | POA: Diagnosis present

## 2021-11-30 DIAGNOSIS — I745 Embolism and thrombosis of iliac artery: Secondary | ICD-10-CM | POA: Diagnosis present

## 2021-11-30 DIAGNOSIS — E43 Unspecified severe protein-calorie malnutrition: Secondary | ICD-10-CM

## 2021-11-30 DIAGNOSIS — Z8616 Personal history of COVID-19: Secondary | ICD-10-CM | POA: Diagnosis not present

## 2021-11-30 DIAGNOSIS — J449 Chronic obstructive pulmonary disease, unspecified: Secondary | ICD-10-CM | POA: Diagnosis present

## 2021-11-30 DIAGNOSIS — I998 Other disorder of circulatory system: Secondary | ICD-10-CM

## 2021-11-30 DIAGNOSIS — Z89422 Acquired absence of other left toe(s): Secondary | ICD-10-CM

## 2021-11-30 DIAGNOSIS — M170 Bilateral primary osteoarthritis of knee: Secondary | ICD-10-CM | POA: Diagnosis present

## 2021-11-30 DIAGNOSIS — Z85828 Personal history of other malignant neoplasm of skin: Secondary | ICD-10-CM

## 2021-11-30 DIAGNOSIS — Z87442 Personal history of urinary calculi: Secondary | ICD-10-CM | POA: Diagnosis not present

## 2021-11-30 DIAGNOSIS — Z9049 Acquired absence of other specified parts of digestive tract: Secondary | ICD-10-CM | POA: Diagnosis not present

## 2021-11-30 DIAGNOSIS — F331 Major depressive disorder, recurrent, moderate: Secondary | ICD-10-CM

## 2021-11-30 DIAGNOSIS — Z7901 Long term (current) use of anticoagulants: Secondary | ICD-10-CM | POA: Diagnosis not present

## 2021-11-30 DIAGNOSIS — I70229 Atherosclerosis of native arteries of extremities with rest pain, unspecified extremity: Secondary | ICD-10-CM

## 2021-11-30 DIAGNOSIS — Z90710 Acquired absence of both cervix and uterus: Secondary | ICD-10-CM

## 2021-11-30 DIAGNOSIS — Z888 Allergy status to other drugs, medicaments and biological substances status: Secondary | ICD-10-CM

## 2021-11-30 DIAGNOSIS — I480 Paroxysmal atrial fibrillation: Secondary | ICD-10-CM

## 2021-11-30 DIAGNOSIS — J439 Emphysema, unspecified: Secondary | ICD-10-CM

## 2021-11-30 DIAGNOSIS — Z8249 Family history of ischemic heart disease and other diseases of the circulatory system: Secondary | ICD-10-CM

## 2021-11-30 HISTORY — PX: LOWER EXTREMITY ANGIOGRAPHY: CATH118251

## 2021-11-30 LAB — RESP PANEL BY RT-PCR (FLU A&B, COVID) ARPGX2
Influenza A by PCR: NEGATIVE
Influenza B by PCR: NEGATIVE
SARS Coronavirus 2 by RT PCR: NEGATIVE

## 2021-11-30 LAB — APTT: aPTT: 35 seconds (ref 24–36)

## 2021-11-30 LAB — BUN: BUN: 14 mg/dL (ref 8–23)

## 2021-11-30 LAB — GLUCOSE, CAPILLARY: Glucose-Capillary: 101 mg/dL — ABNORMAL HIGH (ref 70–99)

## 2021-11-30 LAB — CREATININE, SERUM
Creatinine, Ser: 0.77 mg/dL (ref 0.44–1.00)
GFR, Estimated: >60 mL/min (ref >60)

## 2021-11-30 SURGERY — LOWER EXTREMITY ANGIOGRAPHY
Anesthesia: Moderate Sedation | Site: Leg Lower | Laterality: Left

## 2021-11-30 MED ORDER — HEPARIN (PORCINE) 25000 UT/250ML-% IV SOLN: 800.0000 [IU]/h | INTRAVENOUS | Status: DC

## 2021-11-30 MED ORDER — ALPRAZOLAM 0.5 MG PO TABS
0.5000 mg | ORAL_TABLET | Freq: Two times a day (BID) | ORAL | Status: DC | PRN
  Administered 2021-12-02: 0.5 mg via ORAL
  Filled 2021-11-30: qty 1

## 2021-11-30 MED ORDER — SODIUM CHLORIDE 0.9% FLUSH: 3.0000 mL | INTRAVENOUS | Status: DC | PRN

## 2021-11-30 MED ORDER — ISOSORBIDE MONONITRATE ER 30 MG PO TB24
15.0000 mg | ORAL_TABLET | Freq: Every day | ORAL | Status: DC
  Administered 2021-11-30 – 2021-12-03 (×4): 15 mg via ORAL
  Filled 2021-11-30 (×4): qty 1

## 2021-11-30 MED ORDER — HYDROMORPHONE HCL 1 MG/ML IJ SOLN
INTRAMUSCULAR | Status: AC
  Filled 2021-11-30: qty 0.5

## 2021-11-30 MED ORDER — SODIUM CHLORIDE 0.9 % IV SOLN
INTRAVENOUS | Status: DC
  Administered 2021-11-30: 1000 mL via INTRAVENOUS

## 2021-11-30 MED ORDER — CEFAZOLIN SODIUM-DEXTROSE 1-4 GM/50ML-% IV SOLN
INTRAVENOUS | Status: AC | PRN
  Administered 2021-11-30: 2 g via INTRAVENOUS

## 2021-11-30 MED ORDER — MIDAZOLAM HCL 2 MG/2ML IJ SOLN
INTRAMUSCULAR | Status: DC | PRN
  Administered 2021-11-30 (×2): 1 mg via INTRAVENOUS
  Administered 2021-11-30: 2 mg via INTRAVENOUS

## 2021-11-30 MED ORDER — SODIUM CHLORIDE 0.9 % IV SOLN: 250.0000 mL | INTRAVENOUS | Status: DC | PRN

## 2021-11-30 MED ORDER — SODIUM CHLORIDE 0.9% FLUSH
3.0000 mL | Freq: Two times a day (BID) | INTRAVENOUS | Status: DC
  Administered 2021-12-02 – 2021-12-03 (×3): 3 mL via INTRAVENOUS

## 2021-11-30 MED ORDER — ALTEPLASE 1 MG/ML SYRINGE FOR VASCULAR PROCEDURE
INTRAMUSCULAR | Status: DC | PRN
  Administered 2021-11-30: 6 mg via INTRA_ARTERIAL

## 2021-11-30 MED ORDER — IODIXANOL 320 MG/ML IV SOLN
INTRAVENOUS | Status: DC | PRN
  Administered 2021-11-30: 20 mL via INTRA_ARTERIAL

## 2021-11-30 MED ORDER — HYDROMORPHONE HCL 1 MG/ML IJ SOLN: 1.0000 mg | Freq: Once | INTRAMUSCULAR | Status: DC | PRN

## 2021-11-30 MED ORDER — SODIUM CHLORIDE 0.9 % IV SOLN
0.5000 mg/h | INTRAVENOUS | Status: DC
  Administered 2021-12-01: 0.5 mg/h
  Filled 2021-11-30 (×2): qty 10

## 2021-11-30 MED ORDER — MIDAZOLAM HCL 5 MG/5ML IJ SOLN
INTRAMUSCULAR | Status: AC
  Filled 2021-11-30: qty 5

## 2021-11-30 MED ORDER — FENTANYL CITRATE (PF) 100 MCG/2ML IJ SOLN
INTRAMUSCULAR | Status: DC | PRN
  Administered 2021-11-30 (×2): 25 ug via INTRAVENOUS
  Administered 2021-11-30: 50 ug via INTRAVENOUS

## 2021-11-30 MED ORDER — METOPROLOL TARTRATE 50 MG PO TABS
50.0000 mg | ORAL_TABLET | Freq: Two times a day (BID) | ORAL | Status: DC
  Administered 2021-11-30 – 2021-12-03 (×5): 50 mg via ORAL
  Filled 2021-11-30 (×5): qty 1

## 2021-11-30 MED ORDER — CEFAZOLIN SODIUM-DEXTROSE 1-4 GM/50ML-% IV SOLN
1.0000 g | Freq: Three times a day (TID) | INTRAVENOUS | Status: DC
  Administered 2021-11-30 – 2021-12-02 (×4): 1 g via INTRAVENOUS
  Filled 2021-11-30 (×7): qty 50

## 2021-11-30 MED ORDER — HYDRALAZINE HCL 20 MG/ML IJ SOLN: 5.0000 mg | INTRAMUSCULAR | Status: DC | PRN

## 2021-11-30 MED ORDER — FENTANYL CITRATE PF 50 MCG/ML IJ SOSY
PREFILLED_SYRINGE | INTRAMUSCULAR | Status: AC
  Filled 2021-11-30: qty 2

## 2021-11-30 MED ORDER — CEFAZOLIN SODIUM-DEXTROSE 2-4 GM/100ML-% IV SOLN
INTRAVENOUS | Status: AC
  Filled 2021-11-30: qty 100

## 2021-11-30 MED ORDER — CEFAZOLIN SODIUM-DEXTROSE 2-4 GM/100ML-% IV SOLN: 2.0000 g | Freq: Once | INTRAVENOUS | Status: DC

## 2021-11-30 MED ORDER — OXYCODONE HCL 5 MG PO TABS
5.0000 mg | ORAL_TABLET | ORAL | Status: DC | PRN
  Administered 2021-12-02 – 2021-12-03 (×7): 10 mg via ORAL
  Filled 2021-11-30 (×8): qty 2

## 2021-11-30 MED ORDER — HYDROMORPHONE HCL 1 MG/ML IJ SOLN
0.5000 mg | INTRAMUSCULAR | Status: DC | PRN
  Administered 2021-11-30: 1 mg via INTRAVENOUS
  Administered 2021-11-30: 0.5 mg via INTRAVENOUS
  Administered 2021-11-30 – 2021-12-02 (×9): 1 mg via INTRAVENOUS
  Filled 2021-11-30 (×11): qty 1

## 2021-11-30 MED ORDER — ALTEPLASE 2 MG IJ SOLR
INTRAMUSCULAR | Status: AC
  Filled 2021-11-30: qty 6

## 2021-11-30 MED ORDER — MIDAZOLAM HCL 2 MG/ML PO SYRP: 8.0000 mg | ORAL_SOLUTION | Freq: Once | ORAL | Status: DC | PRN

## 2021-11-30 MED ORDER — PREGABALIN 50 MG PO CAPS
100.0000 mg | ORAL_CAPSULE | Freq: Three times a day (TID) | ORAL | Status: DC
  Administered 2021-11-30 – 2021-12-03 (×8): 100 mg via ORAL
  Filled 2021-11-30 (×8): qty 2

## 2021-11-30 MED ORDER — SODIUM CHLORIDE 0.9 % IV SOLN
1.0000 mg/h | INTRAVENOUS | Status: AC
  Administered 2021-11-30: 1 mg/h
  Filled 2021-11-30: qty 10

## 2021-11-30 MED ORDER — ONDANSETRON HCL 4 MG/2ML IJ SOLN: 4.0000 mg | Freq: Four times a day (QID) | INTRAMUSCULAR | Status: DC | PRN

## 2021-11-30 MED ORDER — HEPARIN SODIUM (PORCINE) 1000 UNIT/ML IJ SOLN
INTRAMUSCULAR | Status: AC
  Filled 2021-11-30: qty 10

## 2021-11-30 MED ORDER — METHYLPREDNISOLONE SODIUM SUCC 125 MG IJ SOLR: 125.0000 mg | Freq: Once | INTRAMUSCULAR | Status: DC | PRN

## 2021-11-30 MED ORDER — IPRATROPIUM-ALBUTEROL 0.5-2.5 (3) MG/3ML IN SOLN: 3.0000 mL | Freq: Every day | RESPIRATORY_TRACT | Status: DC | PRN

## 2021-11-30 MED ORDER — ACETAMINOPHEN 325 MG PO TABS: 650.0000 mg | ORAL_TABLET | ORAL | Status: DC | PRN

## 2021-11-30 MED ORDER — FAMOTIDINE 20 MG PO TABS: 40.0000 mg | ORAL_TABLET | Freq: Once | ORAL | Status: DC | PRN

## 2021-11-30 MED ORDER — DIPHENHYDRAMINE HCL 50 MG/ML IJ SOLN: 50.0000 mg | Freq: Once | INTRAMUSCULAR | Status: DC | PRN

## 2021-11-30 MED ORDER — HEPARIN (PORCINE) 25000 UT/250ML-% IV SOLN
INTRAVENOUS | Status: AC
  Administered 2021-11-30: 800 [IU]/h via INTRA_ARTERIAL
  Filled 2021-11-30: qty 250

## 2021-11-30 MED ORDER — LABETALOL HCL 5 MG/ML IV SOLN: 10.0000 mg | INTRAVENOUS | Status: DC | PRN

## 2021-11-30 MED ORDER — ALBUTEROL SULFATE (2.5 MG/3ML) 0.083% IN NEBU: 3.0000 mL | INHALATION_SOLUTION | Freq: Four times a day (QID) | RESPIRATORY_TRACT | Status: DC | PRN

## 2021-11-30 MED ORDER — SODIUM CHLORIDE 0.9 % IV SOLN: INTRAVENOUS | Status: AC

## 2021-11-30 SURGICAL SUPPLY — 18 items
CANNULA 5F STIFF (CANNULA) ×2 IMPLANT
CATH ANGIO 5F PIGTAIL 65CM (CATHETERS) ×1 IMPLANT
CATH INFUS 90CMX20CM (CATHETERS) ×1 IMPLANT
CATH TEMPO 5F RIM 65CM (CATHETERS) ×1 IMPLANT
CATH VS15FR (CATHETERS) ×1 IMPLANT
DEVICE TORQUE (MISCELLANEOUS) ×1 IMPLANT
GLIDECATH 4FR STR (CATHETERS) ×1 IMPLANT
GLIDESHEATH SLENDER 7FR .021G (SHEATH) ×1 IMPLANT
GLIDEWIRE ADV .035X180CM (WIRE) ×1 IMPLANT
GLIDEWIRE STIFF .35X180X3 HYDR (WIRE) ×1 IMPLANT
GUIDEWIRE ANGLED .035 180CM (WIRE) ×1 IMPLANT
GUIDEWIRE SUPER STIFF .035X180 (WIRE) ×1 IMPLANT
PACK ANGIOGRAPHY (CUSTOM PROCEDURE TRAY) ×2 IMPLANT
SHEATH BRITE TIP 5FRX11 (SHEATH) ×1 IMPLANT
SUT PROLENE 0 CT 1 30 (SUTURE) ×1 IMPLANT
TUBING CONTRAST HIGH PRESS 72 (TUBING) ×1 IMPLANT
WIRE G 018X200 V18 (WIRE) ×1 IMPLANT
WIRE GUIDERIGHT .035X150 (WIRE) ×1 IMPLANT

## 2021-11-30 NOTE — Interval H&P Note (Signed)
History and Physical Interval Note:  11/30/2021 1:17 PM  Carla [name]  has presented today for surgery, with the diagnosis of LLE Angio   BARD   ASO w rest pain.  The various methods of treatment have been discussed with the patient and family. After consideration of risks, benefits and other options for treatment, the patient has consented to  Procedure(s): Lower Extremity Angiography (Left) as a surgical intervention.  The patient's history has been reviewed, patient examined, no change in status, stable for surgery.  I have reviewed the patient's chart and labs.  Questions were answered to the patient's satisfaction.     [name]

## 2021-11-30 NOTE — Progress Notes (Signed)
Patient admitted to ICU from vascular surgery.  Placed on monitor and report received prior to arrival.  Bilateral sheaths noted to both femoral sites.  Sites WNL and patent.  All belongings with patient

## 2021-11-30 NOTE — Op Note (Signed)
[facility]  Percutaneous Study/Intervention Procedural Note   Date of Surgery: 11/30/2021  Surgeon(s):Kemba Hoppes    Assistants:none  Pre-operative Diagnosis: PAD with recurrent rest pain left lower extremity after recent intervention  Post-operative diagnosis:  Same  Procedure(s) Performed:             1.  Ultrasound guidance for vascular access bilateral femoral arteries             2.  Catheter placement into left external iliac artery from right femoral approach and into the aorta from the left femoral approach             3.  Aortogram and selective left lower extremity angiogram             4.  Catheter directed thrombolytic therapy with 6 mg of tPA delivered to the left iliac arteries             5.  Placement of an infusion catheter for continuous thrombolytic therapy in the aorta and left iliac arteries  EBL: 10 cc  Contrast: 20 cc  Fluoro Time: 4.7 minutes  Moderate Conscious Sedation Time: approximately 60 minutes using 4 mg of Versed and 100 mcg of Fentanyl              Indications:  Patient is a 61 y.o.female with recurrent rest pain in the left lower extremity after a recent intervention. The patient has noninvasive study showing marked reduction in left lower extremity perfusion. The patient is brought in for angiography for further evaluation and potential treatment.  Due to the limb threatening nature of the situation, angiogram was performed for attempted limb salvage. The patient is aware that if the procedure fails, amputation would be expected.  The patient also understands that even with successful revascularization, amputation may still be required due to the severity of the situation.  Risks and benefits are discussed and informed consent is obtained.   Procedure:  The patient was identified and appropriate procedural time out was performed.  The patient was then placed supine on the table and prepped and draped in the usual sterile  fashion. Moderate conscious sedation was administered during a face to face encounter with the patient throughout the procedure with my supervision of the RN administering medicines and monitoring the patient's vital signs, pulse oximetry, telemetry and mental status throughout from the start of the procedure until the patient was taken to the recovery room. Ultrasound was used to evaluate the right common femoral artery.  It was patent .  A digital ultrasound image was acquired.  A Seldinger needle was used to access the right common femoral artery under direct ultrasound guidance and a permanent image was performed.  A 0.035 J wire was advanced without resistance and a 5Fr sheath was placed.  Pigtail catheter was placed into the aorta and an AP aortogram was performed. This demonstrated that the renal arteries appeared patent.  The aorta was patent down to the distal segment.  At the right common iliac artery and external iliac artery stents were widely patent.  The left iliac stents were occluded with reconstitution of the hypogastric artery. I then crossed the aortic bifurcation and attempted to advance down on the left but due to the rebuilt aortic bifurcation, I had difficulty getting any catheter up and over the bifurcation.  I tried a pigtail catheter, a rim catheter, and a V S1 catheter.  I did instill 6 mg of tPA through the V S1 catheter  in the left common iliac artery.  I eventually could get a floppy Glidewire and a glide catheter down to the left external iliac artery and this allowed me to perform left leg angiogram.  Selective left lower extremity angiogram was then performed. This demonstrated fairly normal left common femoral artery, superficial femoral artery, with an abrupt occlusion of the popliteal artery and essentially no flow distally seen on these images.  At this point, I tried to get an advantage wire up and over the bifurcation to allow up and over intervention but this was impossible at  this point.  The catheter backed up into the aorta.  At this time, I felt our best option was to get the iliac arteries open with overnight thrombolytic therapy and so I elected to access the left common femoral artery in a retrograde fashion.  This was done under direct ultrasound guidance without difficulty with a micropuncture needle.  A micropuncture wire and sheath were placed and we upsized to a 5 French sheath on the left.  A permanent images recorded.  I then advanced into the aorta from the left femoral approach and confirmed that we were intraluminal.  I then placed a 90 cm total length 20 cm working length thrombolytic catheter from the distal aorta down to the left external iliac artery distally and secured this in place for continuous thrombolytic therapy.  The right femoral sheath was also secured in place.  The patient be brought back for second look angiogram tomorrow after continuous thrombolytic therapy.  The patient was taken to the recovery room in stable condition having tolerated the procedure well.  Findings:               Aortogram: Renal arteries appeared patent.  The aorta was patent down to the distal segment.  At the right common iliac artery and external iliac artery stents were widely patent.  The left iliac stents were occluded with reconstitution of the hypogastric artery.             left Lower Extremity:  This demonstrated fairly normal left common femoral artery, superficial femoral artery, with an abrupt occlusion of the popliteal artery and essentially no flow distally seen on these images.   Disposition: Patient was taken to the recovery room in stable condition having tolerated the procedure well.  Complications: None  [name] 11/30/2021 3:45 PM   This note was created with Dragon Medical transcription system. Any errors in dictation are purely unintentional.

## 2021-12-01 ENCOUNTER — Encounter
Admission: AD | Disposition: A | Discharge status: Home / Self Care | Payer: Self-pay | Source: Ambulatory Visit | Attending: Vascular Surgery

## 2021-12-01 ENCOUNTER — Inpatient Hospital Stay: Payer: Medicare Other | Admitting: Anesthesiology

## 2021-12-01 ENCOUNTER — Encounter: Payer: Self-pay | Admitting: Vascular Surgery

## 2021-12-01 ENCOUNTER — Other Ambulatory Visit (INDEPENDENT_AMBULATORY_CARE_PROVIDER_SITE_OTHER): Payer: Self-pay | Admitting: Nurse Practitioner

## 2021-12-01 DIAGNOSIS — I70222 Atherosclerosis of native arteries of extremities with rest pain, left leg: Secondary | ICD-10-CM | POA: Diagnosis not present

## 2021-12-01 DIAGNOSIS — I70201 Unspecified atherosclerosis of native arteries of extremities, right leg: Secondary | ICD-10-CM | POA: Diagnosis not present

## 2021-12-01 HISTORY — PX: ENDARTERECTOMY FEMORAL: SHX5804

## 2021-12-01 HISTORY — PX: ENDOVASCULAR REPAIR/STENT GRAFT: CATH118280

## 2021-12-01 HISTORY — PX: APPLICATION OF WOUND VAC: SHX5189

## 2021-12-01 LAB — FIBRINOGEN: Fibrinogen: 382 mg/dL (ref 210–475)

## 2021-12-01 LAB — CBC
HCT: 37.2 % (ref 36.0–46.0)
HCT: 33.7 % — ABNORMAL LOW (ref 36.0–46.0)
Hemoglobin: 11.6 g/dL — ABNORMAL LOW (ref 12.0–15.0)
Hemoglobin: 10.4 g/dL — ABNORMAL LOW (ref 12.0–15.0)
MCH: 29.5 pg (ref 26.0–34.0)
MCH: 30.1 pg (ref 26.0–34.0)
MCHC: 31.2 g/dL (ref 30.0–36.0)
MCHC: 30.9 g/dL (ref 30.0–36.0)
MCV: 94.7 fL (ref 80.0–100.0)
MCV: 97.4 fL (ref 80.0–100.0)
Platelets: 229 10*3/uL (ref 150–400)
Platelets: 214 10*3/uL (ref 150–400)
RBC: 3.93 MIL/uL (ref 3.87–5.11)
RBC: 3.46 MIL/uL — ABNORMAL LOW (ref 3.87–5.11)
RDW: 15.8 % — ABNORMAL HIGH (ref 11.5–15.5)
RDW: 15.9 % — ABNORMAL HIGH (ref 11.5–15.5)
WBC: 7.9 10*3/uL (ref 4.0–10.5)
WBC: 11.8 10*3/uL — ABNORMAL HIGH (ref 4.0–10.5)
nRBC: 0 % (ref 0.0–0.2)
nRBC: 0 % (ref 0.0–0.2)

## 2021-12-01 LAB — TYPE AND SCREEN
ABO/RH(D): A POS
Antibody Screen: NEGATIVE

## 2021-12-01 LAB — GLUCOSE, CAPILLARY: Glucose-Capillary: 95 mg/dL (ref 70–99)

## 2021-12-01 SURGERY — ENDARTERECTOMY, FEMORAL
Anesthesia: General | Laterality: Left

## 2021-12-01 SURGERY — ENDOVASCULAR STENT GRAFT (AAA)
Anesthesia: General | Laterality: Left

## 2021-12-01 MED ORDER — FENTANYL CITRATE (PF) 100 MCG/2ML IJ SOLN
25.0000 ug | INTRAMUSCULAR | Status: AC | PRN
  Administered 2021-12-01 (×4): 25 ug via INTRAVENOUS

## 2021-12-01 MED ORDER — "VISTASEAL 4 ML SINGLE DOSE KIT "
PACK | CUTANEOUS | Status: DC | PRN
  Administered 2021-12-01: 4 mL via TOPICAL

## 2021-12-01 MED ORDER — ONDANSETRON HCL 4 MG/2ML IJ SOLN
INTRAMUSCULAR | Status: DC | PRN
Start: 2021-12-01 — End: 2021-12-01
  Administered 2021-12-01: 4 mg via INTRAVENOUS

## 2021-12-01 MED ORDER — IODIXANOL 320 MG/ML IV SOLN
INTRAVENOUS | Status: DC | PRN
  Administered 2021-12-01: 85 mL via INTRA_ARTERIAL

## 2021-12-01 MED ORDER — HEPARIN 5000 UNITS IN NS 1000 ML (FLUSH)
INTRAMUSCULAR | Status: DC | PRN
  Administered 2021-12-01: 500 mL via INTRAMUSCULAR

## 2021-12-01 MED ORDER — EPHEDRINE SULFATE (PRESSORS) 50 MG/ML IJ SOLN
INTRAMUSCULAR | Status: DC | PRN
  Administered 2021-12-01: 5 mg via INTRAVENOUS

## 2021-12-01 MED ORDER — MIDAZOLAM HCL 2 MG/2ML IJ SOLN
INTRAMUSCULAR | Status: AC
  Filled 2021-12-01: qty 2

## 2021-12-01 MED ORDER — GLYCOPYRROLATE 0.2 MG/ML IJ SOLN
INTRAMUSCULAR | Status: DC | PRN
  Administered 2021-12-01: .2 mg via INTRAVENOUS

## 2021-12-01 MED ORDER — LABETALOL HCL 5 MG/ML IV SOLN
INTRAVENOUS | Status: AC
  Filled 2021-12-01: qty 4

## 2021-12-01 MED ORDER — HEMOSTATIC AGENTS (NO CHARGE) OPTIME
TOPICAL | Status: DC | PRN
  Administered 2021-12-01: 1 via TOPICAL

## 2021-12-01 MED ORDER — OXYCODONE HCL 5 MG/5ML PO SOLN: 5.0000 mg | Freq: Once | ORAL | Status: DC | PRN

## 2021-12-01 MED ORDER — TIROFIBAN HCL IV 12.5 MG/250 ML
0.1500 ug/kg/min | INTRAVENOUS | Status: DC
  Administered 2021-12-01 (×2): 0.15 ug/kg/min via INTRAVENOUS
  Filled 2021-12-01 (×4): qty 100
  Filled 2021-12-01: qty 250
  Filled 2021-12-01: qty 100

## 2021-12-01 MED ORDER — PHENYLEPHRINE 40 MCG/ML (10ML) SYRINGE FOR IV PUSH (FOR BLOOD PRESSURE SUPPORT)
PREFILLED_SYRINGE | INTRAVENOUS | Status: DC | PRN
  Administered 2021-12-01 (×7): 80 ug via INTRAVENOUS

## 2021-12-01 MED ORDER — ROCURONIUM BROMIDE 100 MG/10ML IV SOLN
INTRAVENOUS | Status: DC | PRN
Start: 2021-12-01 — End: 2021-12-01
  Administered 2021-12-01: 20 mg via INTRAVENOUS
  Administered 2021-12-01: 10 mg via INTRAVENOUS
  Administered 2021-12-01: 20 mg via INTRAVENOUS
  Administered 2021-12-01: 10 mg via INTRAVENOUS
  Administered 2021-12-01: 30 mg via INTRAVENOUS
  Administered 2021-12-01 (×2): 20 mg via INTRAVENOUS

## 2021-12-01 MED ORDER — SUCCINYLCHOLINE CHLORIDE 200 MG/10ML IV SOSY
PREFILLED_SYRINGE | INTRAVENOUS | Status: DC | PRN
  Administered 2021-12-01: 200 mg via INTRAVENOUS

## 2021-12-01 MED ORDER — MIDAZOLAM HCL 2 MG/2ML IJ SOLN
INTRAMUSCULAR | Status: DC | PRN
  Administered 2021-12-01 (×2): 2 mg via INTRAVENOUS

## 2021-12-01 MED ORDER — LABETALOL HCL 5 MG/ML IV SOLN
INTRAVENOUS | Status: DC | PRN
Start: 2021-12-01 — End: 2021-12-01
  Administered 2021-12-01: 10 mg via INTRAVENOUS

## 2021-12-01 MED ORDER — PROPOFOL 10 MG/ML IV BOLUS
INTRAVENOUS | Status: DC | PRN
Start: 2021-12-01 — End: 2021-12-01
  Administered 2021-12-01: 100 mg via INTRAVENOUS

## 2021-12-01 MED ORDER — CEFAZOLIN SODIUM-DEXTROSE 1-4 GM/50ML-% IV SOLN
INTRAVENOUS | Status: DC | PRN
  Administered 2021-12-01: 1 g via INTRAVENOUS

## 2021-12-01 MED ORDER — HEPARIN SODIUM (PORCINE) 1000 UNIT/ML IJ SOLN
INTRAMUSCULAR | Status: DC | PRN
  Administered 2021-12-01 (×2): 5000 [IU] via INTRAVENOUS
  Administered 2021-12-01: 2500 [IU] via INTRAVENOUS

## 2021-12-01 MED ORDER — FENTANYL CITRATE (PF) 100 MCG/2ML IJ SOLN
INTRAMUSCULAR | Status: AC
  Filled 2021-12-01: qty 2

## 2021-12-01 MED ORDER — FENTANYL CITRATE (PF) 100 MCG/2ML IJ SOLN
INTRAMUSCULAR | Status: DC | PRN
  Administered 2021-12-01: 50 ug via INTRAVENOUS
  Administered 2021-12-01 (×4): 25 ug via INTRAVENOUS

## 2021-12-01 MED ORDER — SUGAMMADEX SODIUM 500 MG/5ML IV SOLN
INTRAVENOUS | Status: DC | PRN
  Administered 2021-12-01: 300 mg via INTRAVENOUS

## 2021-12-01 MED ORDER — PHENYLEPHRINE HCL-NACL 20-0.9 MG/250ML-% IV SOLN
INTRAVENOUS | Status: DC | PRN
  Administered 2021-12-01: 30 ug/min via INTRAVENOUS

## 2021-12-01 MED ORDER — ACETAMINOPHEN 10 MG/ML IV SOLN
INTRAVENOUS | Status: DC | PRN
  Administered 2021-12-01: 1000 mg via INTRAVENOUS

## 2021-12-01 MED ORDER — ACETAMINOPHEN 10 MG/ML IV SOLN
INTRAVENOUS | Status: AC
  Filled 2021-12-01: qty 100

## 2021-12-01 MED ORDER — OXYCODONE HCL 5 MG PO TABS: 5.0000 mg | ORAL_TABLET | Freq: Once | ORAL | Status: DC | PRN

## 2021-12-01 MED ORDER — FENTANYL CITRATE (PF) 100 MCG/2ML IJ SOLN
INTRAMUSCULAR | Status: AC
  Administered 2021-12-01: 25 ug via INTRAVENOUS
  Filled 2021-12-01: qty 2

## 2021-12-01 MED ORDER — DEXAMETHASONE SODIUM PHOSPHATE 10 MG/ML IJ SOLN
INTRAMUSCULAR | Status: DC | PRN
  Administered 2021-12-01: 10 mg via INTRAVENOUS

## 2021-12-01 MED ORDER — PROPOFOL 10 MG/ML IV BOLUS
INTRAVENOUS | Status: AC
  Filled 2021-12-01: qty 20

## 2021-12-01 MED ORDER — DEXMEDETOMIDINE (PRECEDEX) IN NS 20 MCG/5ML (4 MCG/ML) IV SYRINGE
PREFILLED_SYRINGE | INTRAVENOUS | Status: DC | PRN
  Administered 2021-12-01: 4 ug via INTRAVENOUS
  Administered 2021-12-01: 8 ug via INTRAVENOUS
  Administered 2021-12-01 (×2): 4 ug via INTRAVENOUS

## 2021-12-01 MED ORDER — LACTATED RINGERS IV SOLN: INTRAVENOUS | Status: DC | PRN

## 2021-12-01 MED ORDER — LIDOCAINE HCL (CARDIAC) PF 100 MG/5ML IV SOSY
PREFILLED_SYRINGE | INTRAVENOUS | Status: DC | PRN
  Administered 2021-12-01: 100 mg via INTRAVENOUS

## 2021-12-01 SURGICAL SUPPLY — 72 items
APPLIER CLIP 11 MED OPEN (CLIP)
APPLIER CLIP 9.375 SM OPEN (CLIP)
BAG DECANTER FOR FLEXI CONT (MISCELLANEOUS) ×3 IMPLANT
BAG ISOLATATION DRAPE 20X20 ST (DRAPES) IMPLANT
BLADE SURG 15 STRL LF DISP TIS (BLADE) ×2 IMPLANT
BLADE SURG 15 STRL SS (BLADE) ×1
BLADE SURG SZ11 CARB STEEL (BLADE) ×3 IMPLANT
BOOT SUTURE AID YELLOW STND (SUTURE) ×3 IMPLANT
BRUSH SCRUB EZ  4% CHG (MISCELLANEOUS) ×1
BRUSH SCRUB EZ 4% CHG (MISCELLANEOUS) ×2 IMPLANT
CANISTER WOUND CARE 500ML ATS (WOUND CARE) ×1 IMPLANT
CATH EMBOLECTOMY 3X80 (CATHETERS) ×1 IMPLANT
CHLORAPREP W/TINT 26 (MISCELLANEOUS) ×3 IMPLANT
CLIP APPLIE 11 MED OPEN (CLIP) IMPLANT
CLIP APPLIE 9.375 SM OPEN (CLIP) IMPLANT
DERMABOND ADVANCED (GAUZE/BANDAGES/DRESSINGS) ×1
DERMABOND ADVANCED .7 DNX12 (GAUZE/BANDAGES/DRESSINGS) ×2 IMPLANT
DRAPE INCISE IOBAN 66X45 STRL (DRAPES) ×3 IMPLANT
DRAPE ISOLATE BAG 20X20 STRL (DRAPES)
DRSG OPSITE POSTOP 4X6 (GAUZE/BANDAGES/DRESSINGS) IMPLANT
ELECT CAUTERY BLADE 6.4 (BLADE) ×3 IMPLANT
ELECT REM PT RETURN 9FT ADLT (ELECTROSURGICAL) ×3
ELECTRODE REM PT RTRN 9FT ADLT (ELECTROSURGICAL) ×2 IMPLANT
GAUZE 4X4 16PLY ~~LOC~~+RFID DBL (SPONGE) ×3 IMPLANT
GLOVE SURG SYN 7.0 (GLOVE) ×6 IMPLANT
GLOVE SURG SYN 7.0 PF PI (GLOVE) ×4 IMPLANT
GOWN STRL REUS W/ TWL LRG LVL3 (GOWN DISPOSABLE) ×2 IMPLANT
GOWN STRL REUS W/ TWL XL LVL3 (GOWN DISPOSABLE) ×4 IMPLANT
GOWN STRL REUS W/TWL LRG LVL3 (GOWN DISPOSABLE) ×1
GOWN STRL REUS W/TWL XL LVL3 (GOWN DISPOSABLE) ×2
HEMOSTAT SURGICEL 2X3 (HEMOSTASIS) ×3 IMPLANT
IV NS 500ML (IV SOLUTION) ×1
IV NS 500ML BAXH (IV SOLUTION) ×2 IMPLANT
KIT PREVENA INCISION MGT 13 (CANNISTER) ×1 IMPLANT
KIT TURNOVER KIT A (KITS) ×3 IMPLANT
LABEL OR SOLS (LABEL) ×3 IMPLANT
LOOP RED MAXI  1X406MM (MISCELLANEOUS) ×2
LOOP VESSEL MAXI 1X406 RED (MISCELLANEOUS) ×4 IMPLANT
LOOP VESSEL MINI 0.8X406 BLUE (MISCELLANEOUS) ×4 IMPLANT
LOOPS BLUE MINI 0.8X406MM (MISCELLANEOUS) ×2
MANIFOLD NEPTUNE II (INSTRUMENTS) ×3 IMPLANT
NDL SAFETY ECLIPSE 18X1.5 (NEEDLE) ×2 IMPLANT
NEEDLE HYPO 18GX1.5 SHARP (NEEDLE) ×1
NS IRRIG 500ML POUR BTL (IV SOLUTION) ×3 IMPLANT
PACK BASIN MAJOR ARMC (MISCELLANEOUS) ×3 IMPLANT
PACK UNIVERSAL (MISCELLANEOUS) ×3 IMPLANT
PATCH CAROTID ECM VASC 1X10 (Prosthesis & Implant Heart) ×1 IMPLANT
PENCIL ELECTRO HAND CTR (MISCELLANEOUS) IMPLANT
SET WALTER ACTIVATION W/DRAPE (SET/KITS/TRAYS/PACK) ×3 IMPLANT
SPONGE T-LAP 18X18 ~~LOC~~+RFID (SPONGE) ×6 IMPLANT
STAPLER SKIN PROX 35W (STAPLE) ×1 IMPLANT
SUT MNCRL 4-0 (SUTURE) ×1
SUT MNCRL 4-0 27XMFL (SUTURE) ×2
SUT PROLENE 5 0 RB 1 DA (SUTURE) ×8 IMPLANT
SUT PROLENE 6 0 BV (SUTURE) ×12 IMPLANT
SUT PROLENE 7 0 BV 1 (SUTURE) ×8 IMPLANT
SUT SILK 2 0 (SUTURE) ×1
SUT SILK 2-0 18XBRD TIE 12 (SUTURE) ×2 IMPLANT
SUT SILK 3 0 (SUTURE)
SUT SILK 3-0 18XBRD TIE 12 (SUTURE) ×2 IMPLANT
SUT SILK 4 0 (SUTURE) ×1
SUT SILK 4-0 18XBRD TIE 12 (SUTURE) ×2 IMPLANT
SUT VIC AB 2-0 CT1 27 (SUTURE) ×2
SUT VIC AB 2-0 CT1 TAPERPNT 27 (SUTURE) ×4 IMPLANT
SUT VIC AB 3-0 SH 27 (SUTURE) ×1
SUT VIC AB 3-0 SH 27X BRD (SUTURE) ×2 IMPLANT
SUT VICRYL+ 3-0 36IN CT-1 (SUTURE) ×6 IMPLANT
SUTURE MNCRL 4-0 27XMF (SUTURE) ×2 IMPLANT
SYR 20ML LL LF (SYRINGE) ×3 IMPLANT
SYR 5ML LL (SYRINGE) ×3 IMPLANT
TRAY FOLEY MTR SLVR 16FR STAT (SET/KITS/TRAYS/PACK) ×2 IMPLANT
WATER STERILE IRR 500ML POUR (IV SOLUTION) ×3 IMPLANT

## 2021-12-01 SURGICAL SUPPLY — 42 items
BALLN DORADO 9X80X80 (BALLOONS) ×4
BALLN LUTONIX 018 4X80X130 (BALLOONS) ×2
BALLOON DORADO 9X80X80 (BALLOONS) IMPLANT
BALLOON LUTONIX 018 4X80X130 (BALLOONS) IMPLANT
CANISTER PENUMBRA ENGINE (MISCELLANEOUS) ×1 IMPLANT
CATH ACCU-VU SIZ PIG 5F 70CM (CATHETERS) ×1 IMPLANT
CATH BALLN CODA 9X100X32 (BALLOONS) ×1 IMPLANT
CATH INDIGO 7D KIT (CATHETERS) ×1 IMPLANT
CATH NAVICROSS ANGLED 90CM (MICROCATHETER) ×1 IMPLANT
CATH SIZING 5F 100CM .035 PIG (CATHETERS) ×1 IMPLANT
CATH TEMPO 5F RIM 65CM (CATHETERS) ×1 IMPLANT
CATH VERT 5X100 (CATHETERS) ×1 IMPLANT
CLOSURE PERCLOSE PROSTYLE (VASCULAR PRODUCTS) ×5 IMPLANT
COVER DRAPE FLUORO 36X44 (DRAPES) ×2 IMPLANT
COVER PROBE U/S 5X48 (MISCELLANEOUS) ×1 IMPLANT
DERMABOND ADVANCED (GAUZE/BANDAGES/DRESSINGS) ×1
DERMABOND ADVANCED .7 DNX12 (GAUZE/BANDAGES/DRESSINGS) IMPLANT
DEVICE ENSNARE  12MMX20MM (VASCULAR PRODUCTS) ×1
DEVICE ENSNARE 12MMX20MM (VASCULAR PRODUCTS) IMPLANT
DEVICE SAFEGUARD 24CM (GAUZE/BANDAGES/DRESSINGS) ×1 IMPLANT
DEVICE STARCLOSE SE CLOSURE (Vascular Products) ×1 IMPLANT
DRAPE INCISE IOBAN 66X45 STRL (DRAPES) ×1 IMPLANT
DRAPE TABLE BACK 80X90 (DRAPES) ×1 IMPLANT
ENSNARE 18-30 (MISCELLANEOUS) ×1 IMPLANT
GAUZE SPONGE 4X4 12PLY STRL (GAUZE/BANDAGES/DRESSINGS) ×2 IMPLANT
GLIDECATH 4FR STR (CATHETERS) ×1 IMPLANT
GLIDEWIRE ADV .035X260CM (WIRE) ×1 IMPLANT
GUIDEWIRE PFTE-COATED .018X300 (WIRE) ×1 IMPLANT
GUIDEWIRE SUPER STIFF .035X180 (WIRE) ×1 IMPLANT
KIT ENCORE 26 ADVANTAGE (KITS) ×2 IMPLANT
PACK ANGIOGRAPHY (CUSTOM PROCEDURE TRAY) ×2 IMPLANT
SHEATH AFX SYS S1745 (SHEATH) ×1 IMPLANT
SHEATH BRITE TIP 8FRX11 (SHEATH) ×1 IMPLANT
SHEATH MICROPUNCTURE PEDAL 4FR (SHEATH) ×1 IMPLANT
SHEATH PINNACLE ST 6F 45CM (SHEATH) ×1 IMPLANT
STENT GRAFT AFX 22X40/13X14 (Endovascular Graft) ×1 IMPLANT
SUT MNCRL AB 4-0 PS2 18 (SUTURE) ×1 IMPLANT
SYR MEDRAD MARK 7 150ML (SYRINGE) ×1 IMPLANT
TUBING CONTRAST HIGH PRESS 72 (TUBING) ×1 IMPLANT
WIRE G LUND 35X260X7 (WIRE) ×1 IMPLANT
WIRE G V18X300CM (WIRE) ×2 IMPLANT
WIRE GUIDERIGHT .035X150 (WIRE) ×2 IMPLANT

## 2021-12-01 NOTE — Op Note (Signed)
[facility]  Percutaneous Study/Intervention Procedural Note   Date of Surgery: 12/01/2021  Surgeon(s): [name], MD and Hortencia Pilar, MD -co-surgeon's  Assistants:none  Pre-operative Diagnosis: PAD with recurrent rest pain left lower extremity, status post overnight thrombolytic therapy  Post-operative diagnosis:  Same  Procedure(s) Performed:             1.  Aortogram and selective left lower extremity angiogram and Catheter placement into aorta from right femoral approach and into the right femoral artery from left femoral approach             2.  Mechanical thrombectomy of the left common and external iliac arteries with the penumbra CAT 7D catheter             3.  Placement of an aortic stent graft and bilateral common iliac artery stents with the use of the AFX device which was 22 mm proximal by 40 mm in length in the aorta and 13 mm stents distally in both common iliac arteries.  Dr. Delana Meyer working on the left myself working on the right             4.  Percutaneous transluminal angioplasty of left profunda femoris artery with 4 mm diameter angioplasty balloon             5.  Pro-glide closure device left femoral artery  6.  StarClose closure device right femoral artery  EBL: 50  Contrast: 85 cc  Fluoro Time: 28 minutes  Anesthesia: general              Indications:  Patient is a 61 y.o.female with critical limb ischemia.  She has been running tPA and heparin overnight for thrombosis of the left iliac system.  The patient is brought in for angiography for further evaluation and potential treatment.  We have struggled with patency of her iliac stents and there appeared to be an abnormality at the distal aorta as well as problems within the iliac stents and just below the iliac stents on the left and she is brought back in for a stent graft repair of the aorta and bilateral common iliac arteries to try to make this more likely to remain patent.  This  also gives [place] an opportunity to potentially go up and over for treatment for a known left popliteal occlusion that was seen previously.  Risks and benefits are discussed and informed consent is obtained.   Procedure:  The patient was identified and appropriate procedural time out was performed.  The patient was then placed supine on the table and prepped and draped in the usual sterile fashion.  The patient was administered a general anesthetic and her previously placed stents were in place.  A wire was placed through the existing lysis catheter on the left.  Imaging showed continued thrombosis of the left iliac stents.  We then did Pro-glide closure devices on the left side and upsized to an 8 [place] sheath.  Mechanical thrombectomy was then performed the penumbra CAT 7D catheter in the left common and external iliac arteries with significant debulking of the thrombus on that side.  We then upsized over a Lunderquist wire to the 17 French sheath on the left.  We already had a 7 [place] sheath on the right.  A snare was placed up through the right side in the distal aorta and we advanced the 48 French sheath up into the left into the aorta.  The AFX device was then  brought onto the field and placed through the large sheath on the left side with the contralateral wire snared in the distal aorta and pulled out the right femoral sheath.  Care was taken to avoid wire wrap and then this was brought onto the field and parked on the aortic bifurcation pulling down on the previously placed lifestream stents.  This was a 22 mm proximal 40 mm proximal main body piece with 40 mm distal 13 mm iliac limbs.  These were then postdilated with 9 mm balloons in the both iliac limbs and 9 mm kissing balloons up into the aorta.  Completion imaging showed the aorta and both common and external iliac arteries to now be widely patent.  We then completed our ProGlide devices on the left side and secured these down.  I then crossed up and  over the aortic bifurcation with a rim catheter and advantage wire advancing a glide catheter down to the left external iliac artery.  Imaging showed occlusion of the left common femoral artery as well as what appeared to be some degree of extravasation in this area.  This occlusion tracked into the proximal portions of the profunda femoris artery and the superficial femoral artery.  There also remained continued occlusion of the left popliteal artery with what appeared to be only peroneal runoff distally.  Tediously, I was able to get a 6 [place] destination sheath up and over the aortic bifurcation from the right side while Dr. Delana Meyer held a Coda balloon in the distal aorta to ensure that this would not prolapse up into the aorta.  Using this, I was tediously able to get across the occlusion into the profunda femoris artery using a Nava cross catheter and a 0.018 advantage wire. I then inflated a 4 mm diameter by 8 cm length Lutonix drug-coated angioplasty balloon in the left profunda femoris artery and distal common femoral artery inflating this to 12 atm for 1 minute.  Completion imaging showed continued occlusion of the common femoral artery proximal SFA and proximal profunda femoris artery I felt the only way to address this would be with open surgical therapy with endarterectomy and we can address the left popliteal occlusion at that time.  The right sheath was removed and StarClose closure device was deployed in the right femoral artery with excellent hemostatic result. The patient was taken to the recovery room in stable condition having tolerated the procedure well.  Findings:               Aortogram: Continued thrombosis of the left common and external iliac arteries with apparent stenosis at the leading edge of the common iliac artery stents up into the distal aorta.             Left lower Extremity: Occlusion of the left common femoral artery, profunda femoris artery, and proximal superficial  femoral artery with reconstitution of the SFA after proximal occlusion but reocclusion in the popliteal artery and the tibioperoneal trunk and proximal peroneal artery with the reconstitution of the peroneal artery as the only runoff seen distally.   Disposition: Patient was taken to the recovery room in stable condition having tolerated the procedure well.  Complications: None  [name] 12/01/2021 4:00 PM   This note was created with Dragon Medical transcription system. Any errors in dictation are purely unintentional.

## 2021-12-01 NOTE — Op Note (Signed)
OPERATIVE NOTE   PROCEDURE: Left common femoral and superficial femoral endarterectomy with Cormatrix patch angioplasty. Left femoral-popliteal and tibial thromboembolectomy using a #3 Fogarty catheter.  PRE-OPERATIVE DIAGNOSIS: Ischemic left lower extremity with rest pain; multilevel arterial occlusion and thrombosis  POST-OPERATIVE DIAGNOSIS: Same  CO-SURGEON: Katha Cabal, MD and Algernon Huxley, M.D.  ASSISTANT(S): None  ANESTHESIA: general  ESTIMATED BLOOD LOSS: 125 left cc  FINDING(S): Occlusion of the left common femoral extending into the proximal SFA and profunda femoris in association with thromboembolus from the tibial arteries and popliteal including the distal SFA  SPECIMEN(S):  Calcific plaque from the common femoral and superficial femoral artery.  As a second specimen thromboembolus was retrieved with a Fogarty catheter and sent.  INDICATIONS:   AVYANA PUFFENBARGER 61 y.o. y.o.female who presents emergently with occlusion of the left common femoral extending into the proximal superficial femoral in association with occlusion of the popliteal and tibial arteries.  The left lower extremity is ischemic and the limb is threatened.  Attempts at revascularization using interventional techniques have not been successful and she is being transitioned immediately from specials to the operating room for open surgical repair.  The patient has documented severe atherosclerotic occlusive disease and has undergone numerous minimally invasive treatments in the past. However, at this point his primary area of stricture stenosis resides in the common femoral and origins of the superficial femoral and profunda femoris extending into these arteries and therefore this is not improving using intervention. The patient is therefore undergoing open surgical repair with endarterectomy. The risks and benefits of surgery have been reviewed with the patient, all  questions have answered; alternative therapies have been reviewed as well and the patient has agreed to proceed with surgical open repair.  DESCRIPTION: The patient is transitioned emergently to the operating room.  She is brought back to the operating room directly from specials and placed supine upon the operating table.  The patient received IV antibiotics prior to induction.  After obtaining adequate anesthesia, the patient was prepped and draped in the standard fashion for left femoral endarterectomy femoral exposure.  Co-surgeons are required because this is a complicated procedure with work being performed simultaneously from both the patient's right left sides.  This also expedites the procedure making a shorter operative time reducing complications and improving patient safety.  Attention was turned to the left groin with Dr. Lucky Cowboy working on the patient's right and myself working on the left of the patient.  Vertical  Incision was made over the left common femoral artery and dissection carried down to the common femoral artery with electrocautery.  I dissected out the common femoral artery from the distal external iliac artery (identified by the superficial circumflex vessels) down to the femoral bifurcation.  On initial inspection, the common femoral artery was: densely calcified and there was no palpable pulse noted.    Subsequently the dissection was continued to include all circumflex branches and the profunda femoral artery and superficial femoral artery. The superficial femoral artery was dissected circumferentially for a distance of approximately 3-4 cm and the profunda femoris was dissected circumferentially out to the fourth order branches individual vessel loops were placed around each branch.  Control of all branches was obtained with vessel loops.  A softer area in the distal external iliac artery amendable to clamping was identified.    The patient was given 5000 units of Heparin  intravenously, which was a therapeutic bolus.   After waiting 3 minutes, the distal  external iliac artery was clamped and all of the vessel loops were placed under tension.  Arteriotomy was made in the common femoral artery with a 11-blade and extended it with a Potts scissor proximally and distally extending the distal end down the SFA for approximately 2 cm.   Endarterectomy was then performed under direct visualization using a freer elevator and a right angle from the mid common femoral extending up both proximally and distally. Proximally the endarterectomy was brought up to the level of the clamp where a clean edge was obtained. Distally the endarterectomy was carried down to a soft spot in the SFA where a feathered edge would was obtained.  7-0 Prolene interrupted tacking sutures were placed to secure the leading edge of the plaque in the SFA as well as the intimal edge in the proximal common femoral artery.  A defect in the posterior arterial wall is treated with 3 interrupted 7-0 Prolene sutures.  Once endarterectomy and arterial repair have been completed prior to applying the patch a #3 Fogarty was delivered onto the field.  A total of 3 passes were made down the SFA.  The first to retrieving a large amount of acute as well as chronic organized thrombus.  The third pass was clean.  The Fogarty was actually passed to its maximal length all 3 times.  This would place the balloon at the level of the ankle/dorsum of the foot when we initiated pullback.  Backbleeding was now noted to be quite good and the SFA popliteal and tibials were flushed with heparinized saline and the SFA was reclamped.  At this point, a corematrix patch was fashioned for the geometry of the arteriotomy.  The patch was sewn to the artery with 2 running stitches of 6-0 Prolene, running from each end.  Prior to completing the patch angioplasty, the profunda femoral artery was flushed as was the superficial femoral artery. The system  was then forward flushed. The endarterectomy site was then irrigated copiously with heparinized saline. The patch angioplasty was completed in the usual fashion.  Flow was then reestablished first to the profunda femoris and then the superficial femoral artery. Any gaps or bleeding sites in the suture line were easily controlled with a 6-0 Prolene suture.  Within minutes the patient's foot and below-knee limb looked much better there was near total resolution of the mottling and the cyanosis of the great toe was dramatically improved.  Given this we elected to end the case without further angiography or intervention.  The left groin was then irrigated copiously with sterile saline and subsequently Vistaseal and Surgicel were placed in the wound. The incision was repaired with a double layer of 2-0 Vicryl, a double layer of 3-0 Vicryl, and staples were used to close skin.  The skin was cleaned, dried, and Prevena VAC was applied.  COMPLICATIONS: None  CONDITION: Carlynn Purl, M.D. [facility] Office: [phone]  12/01/2021, 4:22 PM

## 2021-12-01 NOTE — Progress Notes (Signed)
Patient found to have left femoral artery occlusion as well as left popliteal occlusion necessitating emergent surgery.  Family not immediately available at the hospital and this was clearly felt to be an emergency situation.  This had to be done in the operating room and not the angiography suite where we were, so she was transported the operating room intubated for left femoral endarterectomy.

## 2021-12-01 NOTE — Transfer of Care (Signed)
Immediate Anesthesia Transfer of Care Note  Patient: Carla [name]  Procedure(s) Performed: ENDOVASCULAR REPAIR/STENT GRAFT (Left) ENDARTERECTOMY FEMORAL (Left) APPLICATION OF WOUND VAC  Patient Location: PACU  Anesthesia Type:General  Level of Consciousness: drowsy  Airway & Oxygen Therapy: Patient Spontanous Breathing and Patient connected to face mask oxygen  Post-op Assessment: Report given to RN and Post -op Vital signs reviewed and stable  Post vital signs: Reviewed and stable  Last Vitals:  Vitals Value Taken Time  BP 127/70 12/01/21 1621  Temp    Pulse 64 12/01/21 1624  Resp 33 12/01/21 1624  SpO2 100 % 12/01/21 1624  Vitals shown include unvalidated device data.  Last Pain:  Vitals:   12/01/21 1019  TempSrc:   PainSc: 9       Patients Stated Pain Goal: 1 (48/18/59 0931)  Complications: No notable events documented.

## 2021-12-01 NOTE — Progress Notes (Signed)
Pt returned from PACU with wound vac, and Aggrastat infusing..  Placed on monitor.Carla [name]

## 2021-12-01 NOTE — Anesthesia Procedure Notes (Signed)
Arterial Line Insertion Start/End2/06/2022 2:00 PM, 12/01/2021 2:05 PM Performed by: Andria Frames, MD, Lily Peer Shameek Nyquist, CRNA  Patient location: Pre-op. Preanesthetic checklist: patient identified, IV checked, site marked, risks and benefits discussed, surgical consent, monitors and equipment checked, pre-op evaluation, timeout performed and anesthesia consent Patient sedated Left, radial was placed Catheter size: 20 G Hand hygiene performed  and maximum sterile barriers used   Attempts: 2 Procedure performed using ultrasound guided technique. Ultrasound Notes:anatomy identified, needle tip was noted to be adjacent to the nerve/plexus identified and no ultrasound evidence of intravascular and/or intraneural injection Following insertion, dressing applied and Biopatch. Post procedure assessment: normal and unchanged

## 2021-12-01 NOTE — Interval H&P Note (Signed)
History and Physical Interval Note:  12/01/2021 10:34 AM  Carla [name]  has presented today for surgery, with the diagnosis of ischemic leg.  The various methods of treatment have been discussed with the patient and family. After consideration of risks, benefits and other options for treatment, the patient has consented to  Procedure(s): ENDOVASCULAR REPAIR/STENT GRAFT (Left) as a surgical intervention.  The patient's history has been reviewed, patient examined, no change in status, stable for surgery.  I have reviewed the patient's chart and labs.  Questions were answered to the patient's satisfaction.     [name]

## 2021-12-01 NOTE — Op Note (Signed)
OPERATIVE NOTE   PROCEDURE: 1.   Left common femoral, profunda femoris, and superficial femoral artery endarterectomies and patch angioplasty    PRE-OPERATIVE DIAGNOSIS: 1.Atherosclerotic occlusive disease left lower extremities with rest pain left lower extremity 2.  Status post aortoiliac stent placement with discovery of left femoral occlusion as well as left popliteal and tibial occlusion  POST-OPERATIVE DIAGNOSIS: Same  SURGEON: [name], MD  CO-surgeon: Hortencia Pilar, MD  ANESTHESIA:  general  ESTIMATED BLOOD LOSS: 125 cc  FINDING(S): 1.  significant plaque in left common femoral and superficial femoral arteries  SPECIMEN(S):  Left common femoral and superficial femoral artery plaque. Thrombus from left popliteal and tibial arteries  INDICATIONS:    Patient presents with an occluded left femoral as well as left popliteal and tibial arteries after aortoiliac stent placement earlier in the day.  Left femoral endarterectomy is planned to try to improve perfusion.  Attempts at clearing the distal perfusion were planned with Fogarty embolectomy and possible endovascular therapy if this is unsuccessful.  The risks and benefits as well as alternative therapies including intervention were reviewed in detail all questions were answered the patient agrees to proceed with surgery.  DESCRIPTION: After obtaining full informed written consent, the patient was brought back to the operating room and placed supine upon the operating table.  The patient received IV antibiotics prior to induction.  After obtaining adequate anesthesia, the patient was prepped and draped in the standard fashion appropriate time out is called.    Vertical incision was created overlying the left femoral arteries. The common femoral artery proximally, and superficial femoral artery, and primary profunda femoris artery branches were encircled with vessel loops and prepared for control. The left femoral arteries were  found to have significant plaque from the common femoral artery into the profunda and superficial femoral arteries.   5000 units of heparin was given and allowed circulate for 5 minutes.   Attention is then turned to the left femoral artery.  An arteriotomy is made with 11 blade and extended with Potts scissors in the common femoral artery and carried down onto the first 2-3 cm of the superficial femoral artery. An endarterectomy was then performed. The [facility] was used to create a plane. The proximal endpoint was cut flush with tenotomy scissors. This was in the proximal common femoral artery. An eversion endarterectomy was then performed for the first 2-3 cm of the profunda femoris artery. Good backbleeding was then seen. The distal endpoint of the superficial femoral artery endarterectomy was created with gentle traction and cut flush with tenotomy scissors and the distal endpoint was tacked down with several 7-0 Prolene sutures.   To address the distal occlusion from thrombus, we then made 3 passes with a #3 Fogarty embolectomy balloon with a significant amount of thrombus and embolus removed with the first 2 passes and no thrombus or embolus removed with the third pass.  This went approximately 60 to 70 cm distally which would be well down into the tibial vessels and would correlate with what we had seen on angiogram earlier in the day.  With no thrombus or embolus returned on the third pass, there was also good backbleeding seen.  We then turned our attention to closure of the arteriotomy.   The Cormatrix patcth is then selected and prepared for a patch angioplasty.  It is cut and beveled and started at the proximal endpoint with a 6-0 Prolene suture.  Approximately one half of the suture line is run medially and  laterally and the distal end point was cut and bevelled to match the arteriotomy.  A second 6-0 Prolene was started at the distal end point and run to the mid portion to complete the  arteriotomy.  The vessel was flushed prior to release of control and completion of the anastomosis.  At this point, flow was established first to the profunda femoris artery and then to the superficial femoral artery. Easily palpable pulses are noted well beyond the anastomosis and both arteries.  Fibrillar and Vistacel topical hemostatic agents were placed in the femoral incision and hemostasis was complete. The femoral incision was then closed in a layered fashion with 2 layers of 2-0 Vicryl, 2 layers of 3-0 Vicryl, and 4-0 Monocryl with an incisional VAC for the skin closure.   The patient was then awakened from anesthesia and taken to the recovery room in stable condition having tolerated the procedure well.  COMPLICATIONS: None  CONDITION: Stable     [name] 12/01/2021 6:32 PM   This note was created with Dragon Medical transcription system. Any errors in dictation are purely unintentional.

## 2021-12-01 NOTE — Progress Notes (Signed)
Transferred to specials via bed for vascular surgery

## 2021-12-01 NOTE — Anesthesia Preprocedure Evaluation (Signed)
Anesthesia Evaluation  Patient identified by MRN, date of birth, ID band Patient awake    Reviewed: Allergy & Precautions, NPO status , Patient's Chart, lab work & pertinent test results  History of Anesthesia Complications Negative for: history of anesthetic complications  Airway Mallampati: II  TM Distance: >3 FB Neck ROM: full    Dental  (+) Edentulous Upper, Edentulous Lower   Pulmonary shortness of breath and with exertion, pneumonia, COPD, Patient abstained from smoking., former smoker,    Pulmonary exam normal        Cardiovascular hypertension, (-) angina+ CAD, + Past MI, + Peripheral Vascular Disease and +CHF  Normal cardiovascular exam     Neuro/Psych PSYCHIATRIC DISORDERS  Neuromuscular disease    GI/Hepatic Neg liver ROS, GERD  Controlled,  Endo/Other  negative endocrine ROS  Renal/GU Renal disease     Musculoskeletal   Abdominal   Peds  Hematology negative hematology ROS (+)   Anesthesia Other Findings Past Medical History: No date: Abdominal aortic atherosclerosis ([place])     Comment:  a. 05/2017 CTA abd/pelvis: significant atherosclerotic               dzs of the infrarenal abd Ao w/ some mural thrombus. No               aneurysm or dissection. No date: Acute focal ischemia of small intestine ([place]) 06/13/2017: Acute right-sided low back pain with right-sided sciatica 07/29/2017: AKI (acute kidney injury) ([place]) No date: Anginal pain ([place])     Comment:  a. 08/2012 Lexiscan MV: EF 54%, non ischemia/infarct. No date: Anxiety and depression No date: Baker's cyst of knee, right     Comment:  a. 07/2016 U/S: 4.1 x 1.4 x 2.9 cystic structure in R               poplitetal fossa. No date: Bell's palsy No date: Borderline diabetes No date: CHF (congestive heart failure) (HCC) No date: Chronic anticoagulation     Comment:  apixaban No date: Chronic idiopathic constipation No date: Chronic mesenteric  ischemia (HCC) No date: Chronic pain No date: COPD (chronic obstructive pulmonary disease) (HCC) No date: Diastolic dysfunction     Comment:  a. 05/2017 Echo: Ef 60-65%, no rwma, Gr1 DD, no source of              cardiac emboli. No date: Dyspnea No date: 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[place])     Comment:  a. 05/2017 CTA Abd/pelvis: apparent thrombus or embolus               in prox SMA (70-90%); b. 05/2017 catheter directed tPA,               mechanical thrombectomy, and stenting of the SMA. 01/19/2016: Essential hypertension with goal blood pressure less than  140/90 01/19/2016: Gastroesophageal reflux disease No date: GERD (gastroesophageal reflux disease) No date: H/O colectomy 11/14/2019: History of 2019 novel coronavirus disease (COVID-19) No date: History of kidney stones No date: Hyperlipidemia No date: Hypertension No date: Morbid obesity with BMI of 40.0-44.9, adult ([place]) 06/19/2017: Occlusive mesenteric ischemia (HCC) No date: Osteoarthritis No date: PAD (peripheral artery disease) (HCC) No date: PAF (paroxysmal atrial fibrillation) (HCC) No date: Palpitations No date: Peripheral edema 06/01/2016: Personal history of other malignant neoplasm of skin No date: Pneumonia No date: Pre-diabetes 06/13/2017: Primary osteoarthritis of both knees 08/06/2017: SBO (small bowel obstruction) (HCC) No date: Skin cancer of nose No date: 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HCC) No date: Vitamin  D deficiency  Past Surgical History: 12/09/2019: AMPUTATION TOE; Left     Comment:  Procedure: AMPUTATION TOE MPJ left;  Surgeon: Caroline More, DPM;  Location: [facility];  Service: Podiatry;                Laterality: Left; No date: APPENDECTOMY No date: CHOLECYSTECTOMY No date: COLON SURGERY 06/21/2020: EMBOLECTOMY OR THROMBECTOMY, WITH OR WITHOUT CATHETER;  FEMOROPOPLITEAL, AORTOILIAC ARTERY, BY LEG INCISION; N/A     Comment:  Left - Decomp Fasciotomy Leg; Ant &/Or  Lat Compart Only;              Midline - Angiography, Visceral, Selective Or               Supraselective (With Or Without Flush Aortogram); Left -               Revascularize, Endovasc, Open/Percut, Iliac Artery,               Unilat, Initial Vessel; W/Translum Stent, W/Angioplasty;               Right - Revascularize, Endovasc, Open/Percut, Iliac               Artery, Ea Add`L Ipsilateral; W/Translumin Stent,               W/Angioplasty; Location [facility]; 08/08/2017: LAPAROTOMY; N/A     Comment:  Procedure: EXPLORATORY LAPAROTOMY POSSIBLE BOWEL               RESECTION;  Surgeon: Jules Husbands, MD;  Location: [facility]               ORS;  Service: General;  Laterality: N/A; 05/17/2021: LEFT HEART CATH AND CORONARY ANGIOGRAPHY; N/A     Comment:  Procedure: LEFT HEART CATH AND CORONARY ANGIOGRAPHY;                Surgeon: Yolonda Kida, MD;  Location: [place]              [facility];  Service: Cardiovascular;  Laterality: N/A; 11/17/2019: LOWER EXTREMITY ANGIOGRAPHY; Left     Comment:  Procedure: LOWER EXTREMITY ANGIOGRAPHY;  Surgeon: Algernon Huxley, MD;  Location: [place] [facility];  Service:               Cardiovascular;  Laterality: Left; 12/13/2020: LOWER EXTREMITY ANGIOGRAPHY; Right     Comment:  Procedure: LOWER EXTREMITY ANGIOGRAPHY;  Surgeon: Algernon Huxley, MD;  Location: [place] [facility];  Service:               Cardiovascular;  Laterality: Right; 01/10/2021: LOWER EXTREMITY ANGIOGRAPHY; Right     Comment:  Procedure: LOWER EXTREMITY ANGIOGRAPHY;  Surgeon: Algernon Huxley, MD;  Location: [place] [facility];  Service:               Cardiovascular;  Laterality: Right; 01/24/2021: LOWER EXTREMITY ANGIOGRAPHY; Right     Comment:  Procedure: LOWER EXTREMITY ANGIOGRAPHY;  Surgeon: Algernon Huxley, MD;  Location: [place] [facility];  Service:  Cardiovascular;  Laterality: Right; 06/28/2021: LOWER EXTREMITY  ANGIOGRAPHY; Bilateral     Comment:  Procedure: Lower Extremity Angiography;  Surgeon: Algernon Huxley, MD;  Location: [place] [facility];  Service:               Cardiovascular;  Laterality: Bilateral; 11/28/2021: LOWER EXTREMITY ANGIOGRAPHY; Right     Comment:  Procedure: LOWER EXTREMITY ANGIOGRAPHY;  Surgeon: Algernon Huxley, MD;  Location: [place] [facility];  Service:               Cardiovascular;  Laterality: Right; 11/19/2019: LOWER EXTREMITY INTERVENTION; N/A     Comment:  Procedure: LOWER EXTREMITY INTERVENTION;  Surgeon: Algernon Huxley, MD;  Location: [place] [facility];  Service:               Cardiovascular;  Laterality: N/A; 06/29/2021: LOWER EXTREMITY INTERVENTION; Bilateral     Comment:  Procedure: LOWER EXTREMITY INTERVENTION;  Surgeon: Algernon Huxley, MD;  Location: [place] [facility];  Service:               Cardiovascular;  Laterality: Bilateral; 06/22/2017: TEE WITHOUT CARDIOVERSION; N/A     Comment:  Procedure: TRANSESOPHAGEAL ECHOCARDIOGRAM (TEE);                Surgeon: Wellington Hampshire, MD;  Location: [facility];                Service: Cardiovascular;  Laterality: N/A; 07/11/2018: TOTAL KNEE ARTHROPLASTY; Right     Comment:  Procedure: TOTAL KNEE ARTHROPLASTY;  Surgeon: Corky Mull, MD;  Location: [facility];  Service: Orthopedics;                Laterality: Right; No date: VAGINAL HYSTERECTOMY 06/20/2017: VISCERAL ARTERY INTERVENTION; N/A     Comment:  Procedure: Visceral Artery Intervention, possible aortic              thrombectomy;  Surgeon: Algernon Huxley, MD;  Location: [place] [facility];  Service: Cardiovascular;  Laterality:               N/A; 01/28/2018: VISCERAL ARTERY INTERVENTION; N/A     Comment:  Procedure: VISCERAL ARTERY INTERVENTION;  Surgeon: Algernon Huxley, MD;  Location: [place] [facility];  Service:               Cardiovascular;  Laterality:  N/A;  BMI    Body Mass Index: 42.82 kg/m      Reproductive/Obstetrics negative OB ROS                             Anesthesia Physical Anesthesia Plan  ASA: 3  Anesthesia Plan: General ETT   Post-op Pain Management:    Induction: Intravenous  PONV Risk Score and Plan: Ondansetron, Dexamethasone, Midazolam and Treatment may vary due to age or medical condition  Airway Management  Planned: Oral ETT  Additional Equipment:   Intra-op Plan:   Post-operative Plan: Extubation in OR  Informed Consent: I have reviewed the patients History and Physical, chart, labs and discussed the procedure including the risks, benefits and alternatives for the proposed anesthesia with the patient or authorized representative who has indicated his/her understanding and acceptance.     Dental Advisory Given  Plan Discussed with: Anesthesiologist, CRNA and Surgeon  Anesthesia Plan Comments: (Patient consented for risks of anesthesia including but not limited to:  - adverse reactions to medications - damage to eyes, teeth, lips or other oral mucosa - nerve damage due to positioning  - sore throat or hoarseness - Damage to heart, brain, nerves, lungs, other parts of body or loss of life  Patient voiced understanding.)        Anesthesia Quick Evaluation

## 2021-12-01 NOTE — Anesthesia Procedure Notes (Addendum)
Procedure Name: Intubation Date/Time: 12/01/2021 11:03 AM Performed by: Lily Peer, Estephani Popper, CRNA Pre-anesthesia Checklist: Patient identified, Emergency Drugs available, Suction available and Patient being monitored Patient Re-evaluated:Patient Re-evaluated prior to induction Oxygen Delivery Method: Circle system utilized Preoxygenation: Pre-oxygenation with 100% oxygen Induction Type: IV induction Ventilation: Mask ventilation without difficulty Laryngoscope Size: McGraph and 3 Grade View: Grade I Tube type: Oral Tube size: 7.0 mm Number of attempts: 1 Airway Equipment and Method: Stylet Placement Confirmation: ETT inserted through vocal cords under direct vision, positive ETCO2 and breath sounds checked- equal and bilateral Secured at: 20 cm Tube secured with: Tape Dental Injury: Teeth and Oropharynx as per pre-operative assessment

## 2021-12-01 NOTE — Op Note (Signed)
[facility]  Percutaneous Study/Intervention Procedural Note   Date of Surgery: 12/01/2021,4:37 PM  Surgeon:Raeana Blinn, Dolores Lory   Pre-operative Diagnosis: Ischemic left lower extremity; multilevel arterial occlusive disease  Post-operative diagnosis:  Same  Procedure(s) Performed:  1.  Abdominal aortogram  2.  Percutaneous transluminal angioplasty and stent placement of the infrarenal abdominal aorta right groin approach  3.  Percutaneous transluminal angioplasty bilateral common iliac artery stents  4.  Percutaneous transluminal angioplasty of the left common femoral and profunda femoris right groin approach  5.  Mechanical thrombectomy of the left common iliac artery using the penumbra CAT 7D thrombectomy catheter              6.  Ultrasound-guided access to the left common femoral   Anesthesia: General by endotracheal intubation.  Sheath: 7 [place] existing sheath right common femoral 16 French left common femoral sheath  Contrast: 85 cc   Fluoroscopy Time: 28.0 minutes  Indications: The patient presented a few days ago with recurrent acute ischemia of her left lower extremity.  She has had numerous interventions in the past with a similar pattern presenting with thrombosis within her arterial system at multiple levels.  She initially underwent thrombolysis overnight attempts at clearing the thrombotic occlusions has not been successful with intervention there appears to be extensive aortic involvement and we have elected to stent the infrarenal aorta and extend the stents down into the common iliac arteries.  Risks and benefits of been reviewed and the patient has agreed with this plan.  Given the numerous interventions she has had she understands that we are struggling to save her left lower extremity and prevent limb loss.  Procedure:  Gagandeep Kossman Cainis a 61 y.o. female who was identified and appropriate procedural time out was performed.  The patient was  returned prepped and both groins were prepped and draped in the usual sterile fashion.  Ultrasound was used to evaluate the left common femoral artery.  It was echolucent and but not pulsatile consistent with the known common iliac artery occlusion.  An ultrasound image was acquired for the permanent record.  A micropuncture needle was used to access the left common femoral artery under direct ultrasound guidance.  The microwire was then advanced under fluoroscopic guidance without difficulty followed by the micro-sheath.  A 0.035 J wire was advanced without resistance and a 7 Fr sheath was placed.  2 Perclose's were then placed in a preclose fashion in preparation for the larger sheath.  The occlusion was then crossed successfully and intraluminal positioning in the proximal infrarenal aorta was confirmed with injection of contrast. Working through the sheath using the penumbra CAT 7D Dr. Lucky Cowboy performed mechanical thrombectomy of the left common iliac arteries.  Follow-up imaging after multiple passes demonstrated approximately 80% of the thrombus had been removed and successful recanalization had been achieved however there was still persistent irregularities consistent with chronic thrombus and/or atherosclerotic plaque and therefore we felt it was appropriate to move forward with stenting to prevent rapid rethrombosis.   A Lunderquist wire was then advanced through the catheter and the left groin sheath upsized to a 65 French delivery sheath for the AFX stent graft.  A 22 x 13 Endologix stent graft was selected.  With Dr. Lucky Cowboy working on the patient's right myself working the patient's left the snare was advanced up the right side.  The wire for the contralateral limb was then advanced up the left side and Dr. Lucky Cowboy captured it with a snare  and pulled extracorporeally.  Working in tandem the wire was then pulled out the right sheath maintaining tension to keep wire wrap from happening and the main body of the  stent was then advanced into the 16 [place] sheath.  The stents were then delivered up into the aorta and the limbs flared after we confirm there was no wire wrap.  The entire device was then pulled down and seated on the aortic bifurcation.  Deployment of the stent was then completed bilaterally.  Using a 9 mm x 80 mm balloon simultaneously from both the right and left angioplasty of the iliac stents was performed.  We then used the balloons in tandem to expand the aortic stent.  Next the [facility] balloon was advanced and used to treat the leading edge of the AFX stent by hand inflation.  Follow-up imaging from the infrarenal aorta demonstrated wide patency of the aortic stent as well as the bilateral iliac stents with preservation of the right internal iliac.  This point we elected to remove the 16 French sheath and secured the Perclose devices.  Working from the right Dr. Lucky Cowboy was able to cross the bifurcation with moderate difficulty but ultimately we were able to get a 6 [place] destination sheath up and over and positioned in the distal external iliac artery.  At this point hand-injection contrast was performed and this demonstrated occlusion of the mid to distal common femoral extending into the proximal SFA and involving the origin of the profunda as well.  Dr. Lucky Cowboy was able to cross this but only into the profunda femoris where angioplasty was performed.  Please see his note for details.  Multiple attempts at crossing into the SFA but these were not successful and we were not able to reestablish flow into the SFA.  Furthermore there appeared to be some extravasation at the site of the occlusion.  Based on these findings we felt that further attempts at salvage with intervention were not likely to be successful and we emergently transitioned the patient from special procedures to the operating room.  This procedure will be dictated as a separate note.   Findings:   Aortogram: There is marked irregularity of  the infrarenal abdominal aorta consistent with plaque and organized thrombus.  The initial images demonstrated occlusion of the left common iliac.  Previously placed right common iliac artery stents are present but again there is significant irregularity suggesting thrombus accumulation.  The bilateral external iliac arteries appear widely patent.  Right Lower Extremity: Common femoral and origins of the SFA and profunda femoris appear widely patent  Left Lower Extremity: Imaging directly of the left common femoral profunda femoris and superficial femoral later in the case demonstrates an occlusion as described above which extends into the origins of the SFA and profunda.     Disposition: Patient was taken to the recovery room in stable condition having tolerated the procedure well.  Belenda Cruise Tihanna Goodson 12/01/2021,4:37 PM

## 2021-12-02 ENCOUNTER — Encounter: Payer: Self-pay | Admitting: Vascular Surgery

## 2021-12-02 MED ORDER — CHLORHEXIDINE GLUCONATE CLOTH 2 % EX PADS
6.0000 | MEDICATED_PAD | Freq: Every day | CUTANEOUS | Status: DC
  Administered 2021-12-02 – 2021-12-03 (×3): 6 via TOPICAL

## 2021-12-02 MED ORDER — PANTOPRAZOLE SODIUM 40 MG PO TBEC
40.0000 mg | DELAYED_RELEASE_TABLET | Freq: Two times a day (BID) | ORAL | Status: DC
  Administered 2021-12-02 – 2021-12-03 (×2): 40 mg via ORAL
  Filled 2021-12-02 (×2): qty 1

## 2021-12-02 MED ORDER — PANTOPRAZOLE SODIUM 40 MG PO TBEC
40.0000 mg | DELAYED_RELEASE_TABLET | Freq: Every day | ORAL | Status: DC
  Administered 2021-12-02: 40 mg via ORAL
  Filled 2021-12-02: qty 1

## 2021-12-02 MED ORDER — APIXABAN 5 MG PO TABS
5.0000 mg | ORAL_TABLET | Freq: Two times a day (BID) | ORAL | Status: DC
  Administered 2021-12-02 – 2021-12-03 (×3): 5 mg via ORAL
  Filled 2021-12-02 (×3): qty 1

## 2021-12-02 NOTE — Evaluation (Signed)
Physical Therapy Evaluation Patient Details Name: Carla [name] MRN: [id number] DOB: 10-14-1961 Today's Date: 12/02/2021  History of Present Illness  61 y.o. female presented for scheduled surgery with the diagnosis of PAD with recurrent rest pain left lower extremity and multilevel arterial occlusion and thrombosis. On 11/30/21, pt underwent abdominal aortogram, angioplasty of infrarenal abdominal aorta right groin approach, angioplasty bilateral common iliac artery stents, angioplasty of the left common femoral and profunda femoris right groin approach and mechanical thrombectomy of the left common iliac artery. On 12/01/21, pt underwent aortoiliac stent graft, left femoral endarterectomy, distal Fogarty embolectomy. PMH includes: peripheral arterial disease status post multiple stents and thrombectomies, SMA thrombosis status post remote intervention, CAD, PAF on Eliquis, COPD, and recurrent ventral hernia.   Clinical Impression  Pt received in bed, agreeable to PT evaluation. She lives at home with her family, has 5 STE and occasionally uses SPC or Rollator for mobility however typically does not use AD during functional mobility. Family is able to be present 24/7. She did become emotional during session as she shared recent losses in her family and medical hardships over the past 5 years.   Pt had the most difficulty and pain during bed mobility requiring MOD A in bilateral directions. STS and ambulation performed with CGA progressing to SUP and less pain, no AD used. Initially performed trial of side steps at EOB due to pain. Pt reporting she would like to ambulate further - PT allowed pt to dictate distance in which she ambulated 150ft. Final stretch of the walk, antalgic pattern worsened and pt returned to requiring CGA. LE strength was not formally tested due to recent procedures; via observation, hip flexion is limited bilaterally. Sensation impaired in B lower legs. PT is recommending pt d/c home  with HHPT upon hospital discharge to address above deficits and return to PLOF.      Recommendations for follow up therapy are one component of a multi-disciplinary discharge planning process, led by the attending physician.  Recommendations may be updated based on patient status, additional functional criteria and insurance authorization.  Follow Up Recommendations Home health PT    Assistance Recommended at Discharge Intermittent Supervision/Assistance  Patient can return home with the following  A little help with walking and/or transfers;A little help with bathing/dressing/bathroom;Assistance with cooking/housework;Assist for transportation;Help with stairs or ramp for entrance    Equipment Recommendations BSC/3in1  Recommendations for Other Services       Functional Status Assessment Patient has had a recent decline in their functional status and demonstrates the ability to make significant improvements in function in a reasonable and predictable amount of time.     Precautions / Restrictions Precautions Precautions: Fall Restrictions Weight Bearing Restrictions: No      Mobility  Bed Mobility Overal bed mobility: Needs Assistance Bed Mobility: Supine to Sit, Sit to Supine     Supine to sit: Mod assist, HOB elevated Sit to supine: Mod assist   General bed mobility comments: MOD A for partial trunk lift and to manage BLE back to bed    Transfers Overall transfer level: Needs assistance Equipment used: None Transfers: Sit to/from Stand Sit to Stand: Min guard           General transfer comment: CGA to steady on first stand; progressed to SUP on second rep    Ambulation/Gait Ambulation/Gait assistance: Min guard Gait Distance (Feet): 180 Feet Assistive device: None Gait Pattern/deviations: Step-through pattern, Antalgic, Decreased stance time - left, Decreased weight shift to left Gait  velocity: decreased     General Gait Details: Pain in LLE>RLE leading  to antalgic gait pattern. Did progress to close SUP mid-ambulation however returned to [place] with fatigue after ~156ft.  Stairs            Wheelchair Mobility    Modified Rankin (Stroke Patients Only)       Balance Overall balance assessment: Needs assistance Sitting-balance support: Feet supported Sitting balance-[name]: Good     Standing balance support: No upper extremity supported, During functional activity Standing balance-[name]: Fair Standing balance comment: No AD, no LOB. Mild steadying required during initial stand and during ambulation.                             Pertinent Vitals/Pain Pain Assessment Pain Assessment: 0-10 Pain Score: 8  Pain Location: L groin/inguinal region Pain Intervention(s): Monitored during session, Repositioned    Home Living Family/patient expects to be discharged to:: Private residence Living Arrangements: Children;Other relatives (daughter, son in law and adult grandkids) Available Help at Discharge: Family;Available 24 hours/day Type of Home: Mobile home Home Access: Stairs to enter Entrance Stairs-Rails: Can reach both Entrance Stairs-Number of Steps: 5   Home Layout: One level Home Equipment: Wheelchair - manual;Cane - single point;Rollator (4 wheels)      Prior Function Prior Level of Function : Independent/Modified Independent;Needs assist;History of Falls (last six months)             Mobility Comments: Assist with tub transfer, otherwise independent or Mod I with functional mobility. Typically does not use AD; only walks household distances. Reports 2-3 falls due to LOB. ADLs Comments: Independent ADLs. Does not participate with cooking/cleaning/housework.     Hand Dominance        Extremity/Trunk Assessment   Upper Extremity Assessment Upper Extremity Assessment: Overall WFL for tasks assessed    Lower Extremity Assessment Lower Extremity Assessment: RLE deficits/detail;LLE  deficits/detail (No MMT performed due to recent procedures. Focal deficits/pain with hip flexion.) RLE: Unable to fully assess due to pain LLE: Unable to fully assess due to pain       Communication   Communication: No difficulties  Cognition Arousal/Alertness: Awake/alert Behavior During Therapy: WFL for tasks assessed/performed Overall Cognitive Status: Within Functional Limits for tasks assessed                                 General Comments: Pleasant and agreeable. Motivated once sitting up.        General Comments      Exercises     Assessment/Plan    PT Assessment Patient needs continued PT services  PT Problem List Decreased strength;Decreased mobility;Decreased activity tolerance;Decreased balance;Impaired sensation       PT Treatment Interventions Therapeutic activities;Gait training;Therapeutic exercise;Balance training;Stair training;Functional mobility training    PT Goals (Current goals can be found in the Care Plan section)  Acute Rehab PT Goals Patient Stated Goal: to go home and be with her family PT Goal Formulation: With patient Time For Goal Achievement: 12/16/21 Potential to Achieve Goals: Good    Frequency Min 2X/week     Co-evaluation               AM-PAC PT "6 Clicks" Mobility  Outcome Measure Help needed turning from your back to your side while in a flat bed without using bedrails?: A Little Help needed moving from lying on  your back to sitting on the side of a flat bed without using bedrails?: A Lot Help needed moving to and from a bed to a chair (including a wheelchair)?: A Little Help needed standing up from a chair using your arms (e.g., wheelchair or bedside chair)?: A Little Help needed to walk in hospital room?: A Little Help needed climbing 3-5 steps with a railing? : A Little 6 Click Score: 17    End of Session Equipment Utilized During Treatment: Gait belt Activity Tolerance: Patient tolerated treatment  well Patient left: in bed;with call bell/phone within reach Nurse Communication: Mobility status PT Visit Diagnosis: Unsteadiness on feet (R26.81);Other abnormalities of gait and mobility (R26.89);Difficulty in walking, not elsewhere classified (R26.2);Muscle weakness (generalized) (M62.81);History of falling (Z91.81)    Time: 3762-8315 PT Time Calculation (min) (ACUTE ONLY): 32 min   Charges:   PT Evaluation $PT Eval Moderate Complexity: 1 Mod PT Treatments $Therapeutic Exercise: 8-22 mins $Therapeutic Activity: 8-22 mins        Patrina Levering PT, DPT 12/02/21 5:12 PM [phone]

## 2021-12-02 NOTE — Anesthesia Postprocedure Evaluation (Signed)
Anesthesia Post Note  Patient: Carla [name]  Procedure(s) Performed: ENDOVASCULAR REPAIR/STENT GRAFT (Left) ENDARTERECTOMY FEMORAL (Left) APPLICATION OF WOUND VAC  Patient location during evaluation: ICU Anesthesia Type: General Level of consciousness: sedated Pain management: pain level controlled Vital Signs Assessment: post-procedure vital signs reviewed and stable Respiratory status: spontaneous breathing Cardiovascular status: stable Postop Assessment: no apparent nausea or vomiting Anesthetic complications: no   No notable events documented.   Last Vitals:  Vitals:   12/02/21 1100 12/02/21 1200  BP: (!) 113/50 92/62  Pulse: 68 78  Resp: 15 17  Temp:  36.6 C  SpO2: 97% 94%    Last Pain:  Vitals:   12/02/21 1200  TempSrc: Oral  PainSc:                  Estill Batten

## 2021-12-02 NOTE — Progress Notes (Signed)
Patient transferred from ICU to floor.  Oriented to room and call bell.  Wound vac intact.  Patient alert and oriented.

## 2021-12-02 NOTE — Progress Notes (Signed)
 Vein and Vascular Surgery  Daily Progress Note   Subjective  -   Doing well this am.  Foot is warm. No events overnight.  Eating breakfast.  Objective Vitals:   12/02/21 0300 12/02/21 0400 12/02/21 0500 12/02/21 0848  BP: 101/77 (!) 110/58 119/61 139/72  Pulse: 71 73 93 75  Resp: (!) 22 20 19    Temp:      TempSrc:      SpO2: 97% 95% 99%   Weight:      Height:        Intake/Output Summary (Last 24 hours) at 12/02/2021 0910 Last data filed at 12/02/2021 2952 Gross per 24 hour  Intake 1600 ml  Output 910 ml  Net 690 ml    PULM  CTAB CV  RRR VASC  Foot is warm, 1+ PT pulse on the left and the right  Laboratory CBC    Component Value Date/Time   WBC 11.8 (H) 12/01/2021 1913   HGB 10.4 (L) 12/01/2021 1913   HGB 13.4 02/12/2015 1137   HCT 33.7 (L) 12/01/2021 1913   HCT 40.9 02/12/2015 1137   PLT 214 12/01/2021 1913   PLT 272 02/12/2015 1137    BMET    Component Value Date/Time   NA 137 06/29/2021 0548   NA 140 02/12/2015 1137   K 4.3 06/29/2021 0548   K 2.9 (L) 02/12/2015 1137   CL 101 06/29/2021 0548   CL 107 02/12/2015 1137   CO2 27 06/29/2021 0548   CO2 27 02/12/2015 1137   GLUCOSE 112 (H) 06/29/2021 0548   GLUCOSE 120 (H) 02/12/2015 1137   BUN 14 11/30/2021 1221   BUN 15 02/12/2015 1137   CREATININE 0.77 11/30/2021 1221   CREATININE 0.78 02/12/2015 1137   CALCIUM 8.6 (L) 06/29/2021 0548   CALCIUM 9.0 02/12/2015 1137   GFRNONAA >60 11/30/2021 1221   GFRNONAA >60 02/12/2015 1137   GFRAA >60 11/23/2019 1330   GFRAA >60 02/12/2015 1137    Assessment/Planning: POD #1 s/p aortoiliac stent graft, left femoral endarterectomy, distal Fogarty embolectomy  Foot is warm Doing well Can likely go to the floor today PT/OT and mobilize today Start Eliquis and stop Aggrastat   [name]  12/02/2021, 9:10 AM

## 2021-12-02 NOTE — TOC Initial Note (Signed)
Transition of Care [facility]) - Initial/Assessment Note    Patient Details  Name: Carla [name] MRN: [id number] Date of Birth: Aug 29, 1961  Transition of Care [facility]) CM/SW Contact:    Shelbie Hutching, RN Phone Number: 12/02/2021, 11:25 AM  Clinical Narrative:                 Patient admitted to the hospital with left lower limb ischemia, atherosclerosis of artery.  Patient s/p left femoral endarterectomy yesterday.   RNCM met with patient at the bedside introduced self and explained role in discharge planning.   Patient is from home with her daughter and son in law.  She reports due to the pain in her legs she has been requiring more assistance from her daughter and son in law at home.  She has a walker, cane, and wheelchair at home, she does not have a 3 in 1 but would like to get one.   Patient does not seem interested in home health at this time she says that her daughter and son in law can help her at home.   Daughter provides transportation.  Patient is current with Dr. Ouida Sills, she gets prescriptions from [facility] on [address].    TOC will follow through discharge.   Expected Discharge Plan: Home/Self Care Barriers to Discharge: Continued Medical Work up   Patient Goals and CMS Choice Patient states their goals for this hospitalization and ongoing recovery are:: Patient wants her pain to be better in her legs      Expected Discharge Plan and Services Expected Discharge Plan: Home/Self Care   Discharge Planning Services: CM Consult   Living arrangements for the past 2 months: Mobile Home                 DME Arranged: 3-N-1 DME Agency: [employer]       HH Arranged: NA          Prior Living Arrangements/Services Living arrangements for the past 2 months: Mobile Home Lives with:: Adult Children Patient language and need for interpreter reviewed:: Yes Do you feel safe going back to the place where you live?: Yes      Need for Family Participation in Patient Care:  Yes (Comment) Care giver support system in place?: Yes (comment) (daughter and son in law) Current home services: DME Criminal Activity/Legal Involvement Pertinent to Current Situation/Hospitalization: No - Comment as needed  Activities of Daily Living Home Assistive Devices/Equipment: Environmental consultant (specify type), Cane (specify quad or straight), Wheelchair ADL Screening (condition at time of admission) Patient's cognitive ability adequate to safely complete daily activities?: Yes Is the patient deaf or have difficulty hearing?: No Does the patient have difficulty seeing, even when wearing glasses/contacts?: No Does the patient have difficulty concentrating, remembering, or making decisions?: No Patient able to express need for assistance with ADLs?: Yes Does the patient have difficulty dressing or bathing?: No Independently performs ADLs?: Yes (appropriate for developmental age) Does the patient have difficulty walking or climbing stairs?: No Weakness of Legs: Both Weakness of Arms/Hands: None  Permission Sought/Granted Permission sought to share information with : Case Manager, Family Supports Permission granted to share information with : Yes, Verbal Permission Granted  Share Information with NAME: Ryland Smoots     Permission granted to share info w Relationship: daughter  Permission granted to share info w Contact Information: [phone]  Emotional Assessment Appearance:: Appears older than stated age Attitude/Demeanor/Rapport: Engaged Affect (typically observed): Accepting Orientation: : Oriented to Self, Oriented to Place, Oriented  to  Time, Oriented to Situation Alcohol / Substance Use: Not Applicable Psych Involvement: No (comment)  Admission diagnosis:  Critical limb ischemia of left lower extremity (HCC) [I70.222] Patient Active Problem List   Diagnosis Date Noted   Critical limb ischemia of left lower extremity ([place]) 11/30/2021   CAD (coronary artery disease) 07/07/2021    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[place]) 06/27/2021   Ischemic pain of right foot 06/27/2021   Arterial occlusion    PAD (peripheral artery disease) ([place]) 05/03/2021   Ischemia of right lower extremity 01/24/2021   Tobacco abuse counseling 10/26/2020   Smoker 10/26/2020   Chronic diastolic CHF (congestive heart failure) ([place]) 10/06/2020   Neuropathic pain 08/19/2020   Bursitis of right shoulder 12/01/2019   Chronic thumb pain, right 12/01/2019   Primary osteoarthritis involving multiple joints 12/01/2019   Ischemia of left lower extremity 11/17/2019   Carpal tunnel syndrome, right 11/10/2019   Health care maintenance 03/26/2019   Disorder of skeletal system 03/26/2019   Problems influencing health status 03/26/2019   Chronic low back pain (Secondary area of Pain) (Bilateral) w/o sciatica 03/26/2019   Rib pain on left side 03/25/2019   Aortic atherosclerosis ([place]) 09/12/2018   COPD (chronic obstructive pulmonary disease) ([place]) 09/12/2018   Moderate episode of recurrent major depressive disorder ([place]) 09/12/2018   Lumbar spondylosis 09/06/2018   History of total knee replacement (Right) 07/11/2018   Ventral hernia with obstruction and without gangrene 07/02/2018   PAF (paroxysmal atrial fibrillation) ([place]) 06/15/2018   Accelerated hypertension 04/08/2018   Acute encephalopathy 04/08/2018   DDD (degenerative disc disease), lumbosacral 09/25/2017   Osteoarthritis of knee 09/25/2017   Adjustment disorder with anxiety 08/15/2017   Chronic idiopathic constipation    Protein-calorie malnutrition, severe 08/08/2017   SBO (small bowel obstruction) ([place]) 08/06/2017   Chronic mesenteric ischemia (HCC)    AKI (acute kidney injury) ([place]) 07/29/2017   Hypotension 07/29/2017   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HCC)    Abdominal pain    Occlusive mesenteric ischemia ([place]) 06/19/2017   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HCC)    Acute focal ischemia of small intestine ([place])    Acute  right-sided low back pain with right-sided sciatica 06/13/2017   Osteoarthritis of knee (Bilateral) 06/13/2017   Chronic pain syndrome 09/13/2016   Morbid obesity with BMI of 40.0-44.9, adult ([place]) 07/16/2016   Personal history of other malignant neoplasm of skin 06/01/2016   Chronic knee pain (Primary Area of Pain) (Bilateral) 01/19/2016   Essential hypertension with goal blood pressure less than 140/90 01/19/2016   Gastroesophageal reflux disease 01/19/2016   Hyperlipidemia 01/19/2016   Prediabetes 01/19/2016   Recurrent major depressive disorder ([place]) 01/19/2016   Urinary tract infection 01/09/2014   Chronic pelvic pain in female 11/14/2013   Mixed stress and urge urinary incontinence 11/14/2013   Chest pain 08/06/2012   PCP:  Kirk Ruths, MD Pharmacy:   [facility] [address] ([address]), [place] [address] [place]) [place] [address] Phone: [phone] Fax: [phone]     Social Determinants of Health (SDOH) Interventions    Readmission Risk Interventions Readmission Risk Prevention Plan 12/02/2021  Post Dischage Appt Complete  Medication Screening Complete  Transportation Screening Complete  Some recent data might be hidden

## 2021-12-03 DIAGNOSIS — D689 Coagulation defect, unspecified: Secondary | ICD-10-CM

## 2021-12-03 MED ORDER — ACETAMINOPHEN 325 MG PO TABS: 650.0000 mg | ORAL_TABLET | ORAL | Status: DC | PRN

## 2021-12-03 MED ORDER — OXYCODONE-ACETAMINOPHEN 5-325 MG PO TABS
1.0000 | ORAL_TABLET | ORAL | 0 refills | Status: DC | PRN
Start: 2021-12-03 — End: 2021-12-09

## 2021-12-03 NOTE — Progress Notes (Signed)
Patient medically cleared to discharge with orders placed by MD for discharge with family here to transport patient to home.  Patient received AVS from Primary RN including information regarding changes to medications and follow up appointments with all questions answered prior to discharge.  Patient switched over to portable [place] prior to discharge with education provided to both patient and family at bedside on how to care for the wound vac appropriately by Primary RN.  Patient opted not to receive 3-in-1 BSC prior to discharge since it would not be covered by insurance.  Patient transported off the unit by volunteer at 1610.

## 2021-12-03 NOTE — TOC Transition Note (Signed)
Transition of Care [facility]) - CM/SW Discharge Note   Patient Details  Name: Carla [name] MRN: [id number] Date of Birth: 12-10-1960  Transition of Care [facility]) CM/SW Contact:  Izola Price, RN Phone Number: 12/03/2021, 9:28 AM   Clinical Narrative: 2/11: Patient to be discharged today to home, had declined need for [facility]. CM notes indicate [employer] notified to deliver 3:1/BSC based on TOC assessment. No further needs identified at this time. Checking with unit RN to make sure DME was delivered. Simmie Davies RN CM       Final next level of care: Home/Self Care Barriers to Discharge: Barriers Resolved   Patient Goals and CMS Choice Patient states their goals for this hospitalization and ongoing recovery are:: Patient wants her pain to be better in her legs      Discharge Placement                       Discharge Plan and Services   Discharge Planning Services: CM Consult            DME Arranged: 3-N-1 DME Agency: [employer]       HH Arranged: NA HH Agency: NA        Social Determinants of Health (SDOH) Interventions     Readmission Risk Interventions Readmission Risk Prevention Plan 12/02/2021  Post Dischage Appt Complete  Medication Screening Complete  Transportation Screening Complete  Some recent data might be hidden

## 2021-12-03 NOTE — Discharge Summary (Signed)
Physician Discharge Summary  Patient ID: Carla [name] MRN: [id number] DOB/AGE: 62-Sep-1962 62 y.o.  Admit date: 11/30/2021 Discharge date: 12/03/2021  Admission Diagnoses:  Discharge Diagnoses:  Principal Problem:   Critical limb ischemia of left lower extremity ([place]) Active Problems:   Coagulation disorder [facility])   Discharged Condition: stable  Hospital Course: This is a chronic peripheral vascular disease patient who presented with ischemia bilaterally.  She initially presented and on 6 February had bilateral percutaneous femoral access with balloon angioplasty to 7 mm using a CAT 7D aspiration thrombectomy on the left.  Unfortunately this failed to eliminate her symptoms, with recurrent breast pain on the left, and catheter directed lysis was performed to the left iliac arteries via right femoral access initiating this on 8 February.  It became clear that she would require relining of her aortoiliac system for remediation of the thrombotic process.  On the ninth, she underwent percutaneous access of both femoral arteries, CAT D aspiration thrombectomy once again of the left iliac system, placement of AFX 22 x 40 length in the aorta and 13 mm stents distally in both common iliac arteries by both Dr. Delana Meyer and Dr. Lucky Cowboy.  Balloon angioplasty was placed performed as well in the left profundus artery, 4 mm with Pro-glide closure on the left,*closure on the right.  The right side remained widely patent but she demonstrated subsequently left common iliac occlusion.  She had surgical exploration of the left common femoral artery with endarterectomy performed extensively in this location and a CorMatrix patch angioplasty performed as well as popliteal and SFA and tibial thrombectomy with 3 Fogarty catheter.  Good signals were present thereafter.  Postop day 1 she was doing well, ambulated, has some pain issues.  Arletha Pili is in place.  This morning pain is tolerable, Prevena remains in place, and she  wants to go home.  Foley catheter is removed, and she will be discharged.  Consults:  Physical therapy, case management  Significant Diagnostic Studies: angiography: Please see HPI for multiple procedures and dates  Treatments: procedures: See HPI for multiple procedures and date  Discharge Exam: Blood pressure (!) 115/50, pulse 89, temperature 100 F (37.8 C), temperature source Oral, resp. rate 18, height 5\' 2"  (1.575 m), weight 106.2 kg, SpO2 96 %. General appearance: alert, cooperative, and no distress Resp: No accessory muscle use Extremities: Warm, some edema bilaterally, excellent perfusion with capillary refill Skin: Skin color, texture, turgor normal. No rashes or lesions Incision/Wound:  Disposition: Discharge disposition: 01-Home or Self Care       Discharge Instructions     Call MD for:  redness, tenderness, or signs of infection (pain, swelling, bleeding, redness, odor or green/yellow discharge around incision site)   Complete by: As directed    Call MD for:  redness, tenderness, or signs of infection (pain, swelling, bleeding, redness, odor or green/yellow discharge around incision site)   Complete by: As directed    Call MD for:  redness, tenderness, or signs of infection (pain, swelling, bleeding, redness, odor or green/yellow discharge around incision site)   Complete by: As directed    Call MD for:  redness, tenderness, or signs of infection (pain, swelling, bleeding, redness, odor or green/yellow discharge around incision site)   Complete by: As directed    Call MD for:  redness, tenderness, or signs of infection (pain, swelling, bleeding, redness, odor or green/yellow discharge around incision site)   Complete by: As directed    Call MD for:  severe or increased pain,  loss or decreased feeling  in affected limb(s)   Complete by: As directed    Call MD for:  severe or increased pain, loss or decreased feeling  in affected limb(s)   Complete by: As directed     Call MD for:  severe or increased pain, loss or decreased feeling  in affected limb(s)   Complete by: As directed    Call MD for:  severe or increased pain, loss or decreased feeling  in affected limb(s)   Complete by: As directed    Call MD for:  severe or increased pain, loss or decreased feeling  in affected limb(s)   Complete by: As directed    Call MD for:  temperature >100.5   Complete by: As directed    Call MD for:  temperature >100.5   Complete by: As directed    Call MD for:  temperature >100.5   Complete by: As directed    Call MD for:  temperature >100.5   Complete by: As directed    Call MD for:  temperature >100.5   Complete by: As directed    Discharge instructions   Complete by: As directed    Leave Preveena in place for 7 days. Home health nursing to replace if need be   Discharge instructions   Complete by: As directed    Leave preveena in place for 7 days, until seen in clinic.   Driving Restrictions   Complete by: As directed    No driving for 10 days   Driving Restrictions   Complete by: As directed    No driving for  10 days   Driving Restrictions   Complete by: As directed    No driving for 10 days   Driving Restrictions   Complete by: As directed    No driving for 10 days   Driving Restrictions   Complete by: As directed    No driving for 10 days   Lifting restrictions   Complete by: As directed    No lifting for 10 days   Lifting restrictions   Complete by: As directed    No lifting for 10 days.   Lifting restrictions   Complete by: As directed    No lifting for 10 days   Lifting restrictions   Complete by: As directed    No lifting for 10 days   Lifting restrictions   Complete by: As directed    No lifting for 10 days   No dressing needed   Complete by: As directed    Replace only if drainage present   No dressing needed   Complete by: As directed    Replace only if drainage present   No dressing needed   Complete by: As  directed    Replace only if drainage present   No dressing needed   Complete by: As directed    Replace only if drainage present   Nursing communication   Complete by: As directed    Please schedule outpatient follow up appt with vascular surgery in 7-10 days. Further instructions for preveena use at that time   Nursing communication   Complete by: As directed    Follow up appt in vascular surgery office in 7 -10 days   Resume previous diet   Complete by: As directed    Resume previous diet   Complete by: As directed    Resume previous diet   Complete by: As directed    Resume previous diet  Complete by: As directed    Resume previous diet   Complete by: As directed       Allergies as of 12/03/2021       Reactions   Gabapentin    Tremors    Bactrim [sulfamethoxazole-trimethoprim] Itching        Medication List     TAKE these medications    acetaminophen 325 MG tablet Commonly known as: TYLENOL Take 2 tablets (650 mg total) by mouth every 4 (four) hours as needed for mild pain.   albuterol 108 (90 Base) MCG/ACT inhaler Commonly known as: VENTOLIN HFA Inhale 2 puffs into the lungs every 6 (six) hours as needed for wheezing or shortness of breath.   ALPRAZolam 0.5 MG tablet Commonly known as: Xanax Take 1 tablet (0.5 mg total) by mouth 2 (two) times daily as needed for anxiety.   apixaban 5 MG Tabs tablet Commonly known as: ELIQUIS Take 1 tablet (5 mg total) by mouth 2 (two) times daily.   aspirin EC 81 MG tablet Take 81 mg by mouth daily. Swallow whole.   atorvastatin 80 MG tablet Commonly known as: LIPITOR Take 80 mg by mouth at bedtime.   Combivent Respimat 20-100 MCG/ACT Aers respimat Generic drug: Ipratropium-Albuterol Inhale 1 puff into the lungs daily as needed for wheezing or shortness of breath.   ferrous sulfate 325 (65 FE) MG tablet Take 325 mg by mouth daily.   HYDROcodone-acetaminophen 5-325 MG tablet Commonly known as:  NORCO/VICODIN Take 1 tablet by mouth every 4 (four) hours as needed for moderate pain.   isosorbide mononitrate 30 MG 24 hr tablet Commonly known as: IMDUR Take 15 mg by mouth daily.   metoprolol tartrate 50 MG tablet Commonly known as: LOPRESSOR Take 1 tablet (50 mg total) by mouth 2 (two) times daily.   nitroGLYCERIN 0.4 MG SL tablet Commonly known as: NITROSTAT Place 0.4 mg under the tongue as directed.   omeprazole 20 MG capsule Commonly known as: PRILOSEC Take 20 mg by mouth 2 (two) times daily before a meal.   oxyCODONE-acetaminophen 5-325 MG tablet Commonly known as: Percocet Take 1 tablet by mouth every 4 (four) hours as needed for severe pain.   pregabalin 100 MG capsule Commonly known as: LYRICA Take 100 mg by mouth 3 (three) times daily.   traMADol 50 MG tablet Commonly known as: ULTRAM Take 1 tablet (50 mg total) by mouth every 6 (six) hours as needed.         Signed: Zara Chess 12/03/2021, 10:30 AM

## 2021-12-04 IMAGING — US US EXTREM LOW VENOUS*L*
1 series · 15 of 24 positions shown · non-contrast
Comparison: None.

CLINICAL DATA: Initial evaluation for acute pain.



[Series 1: us extrem low venous*left* · 15 of 34 slices shown]
[im 1/34]
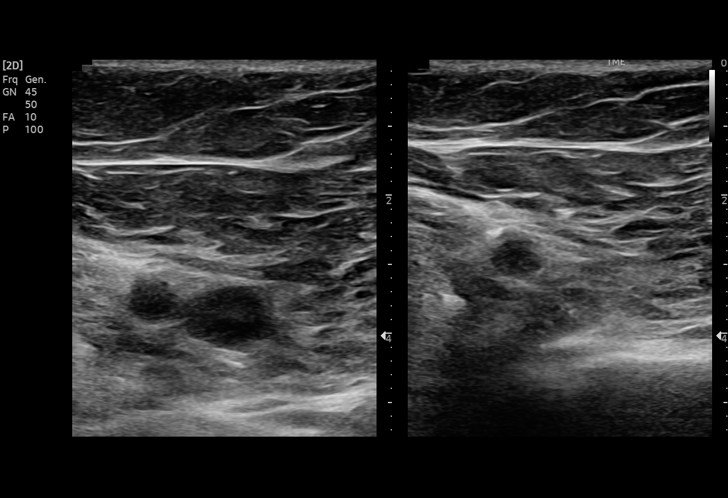
[im 3/34]
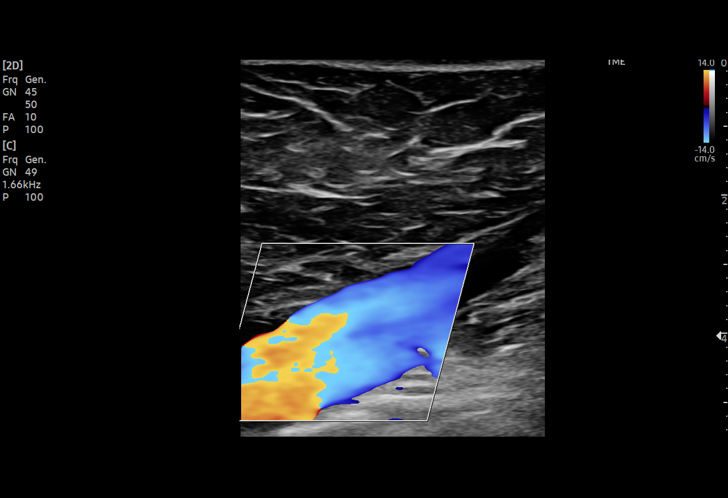
[im 6/34]
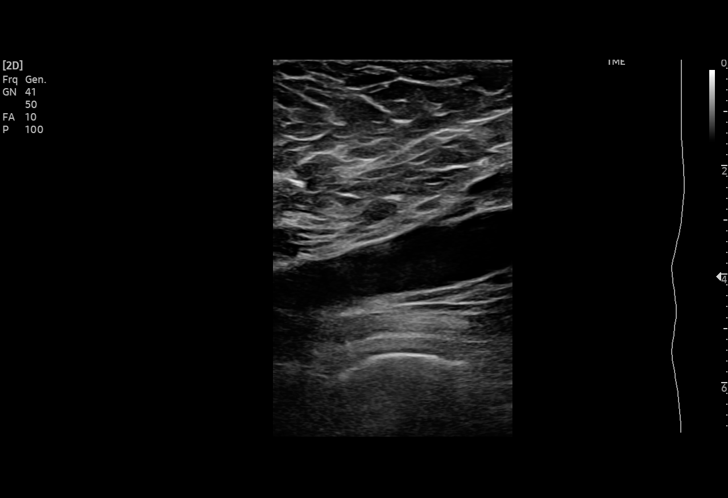
[im 8/34]
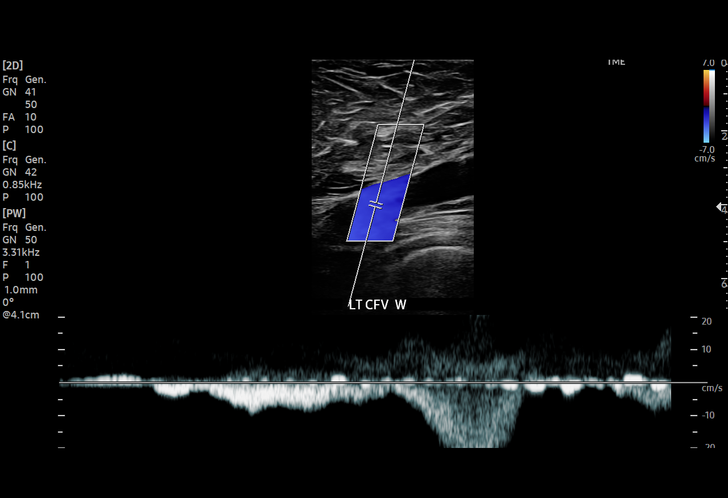
[im 11/34]
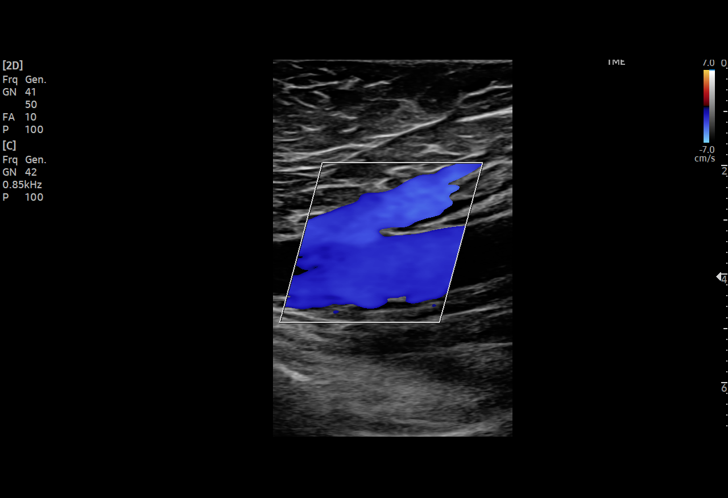
[im 12/34]
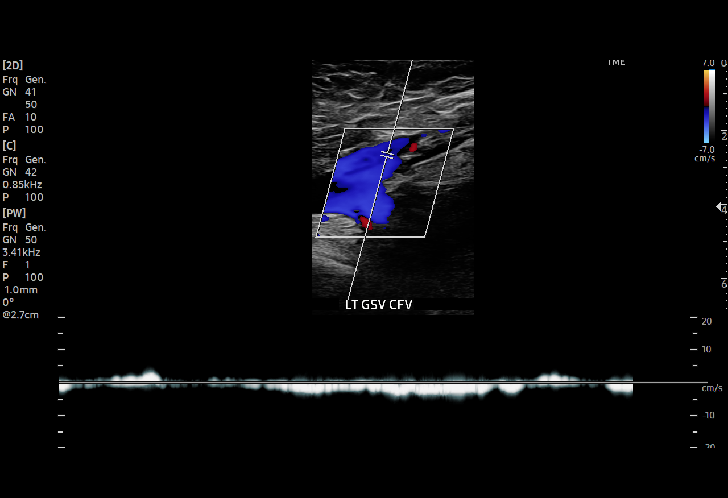
[im 15/34]
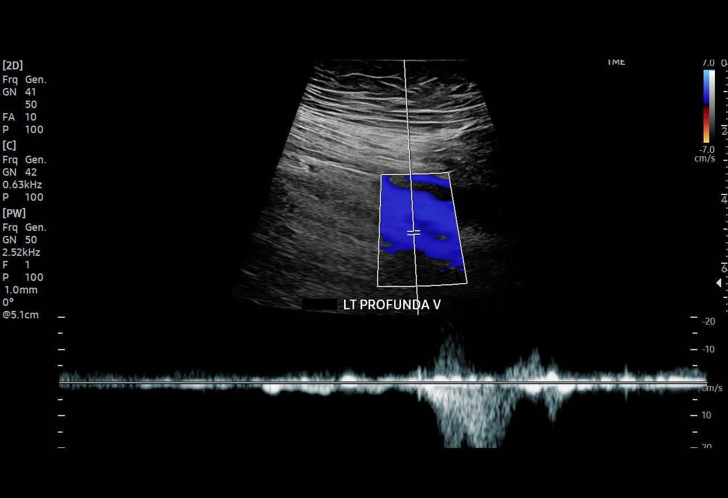
[im 18/34]
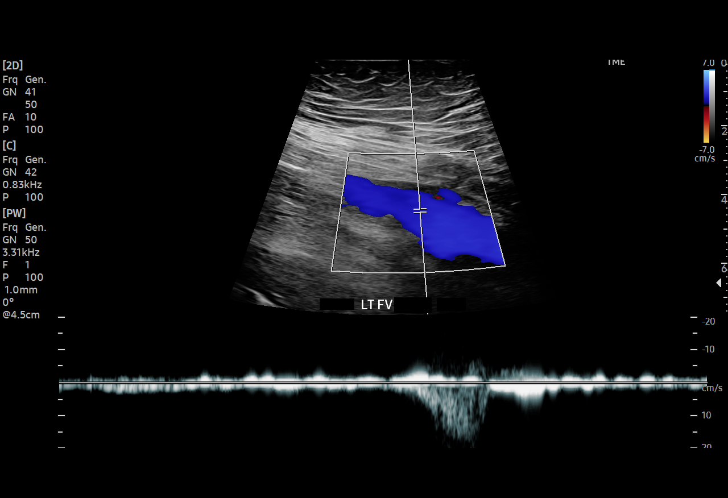
[im 19/34]
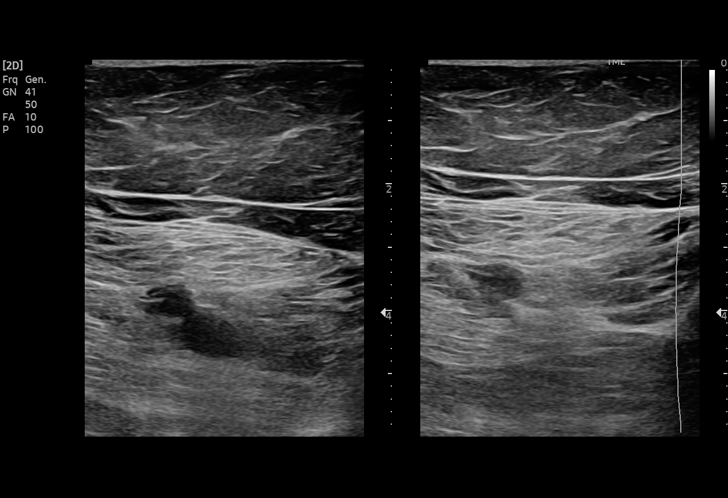
[im 22/34]
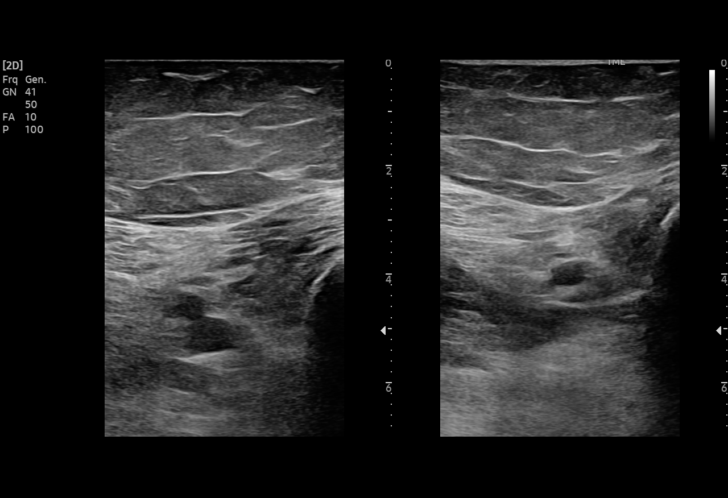
[im 23/34]
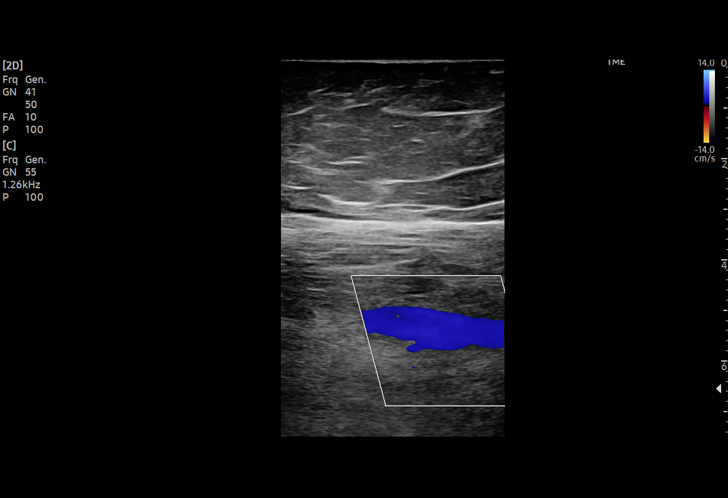
[im 26/34]
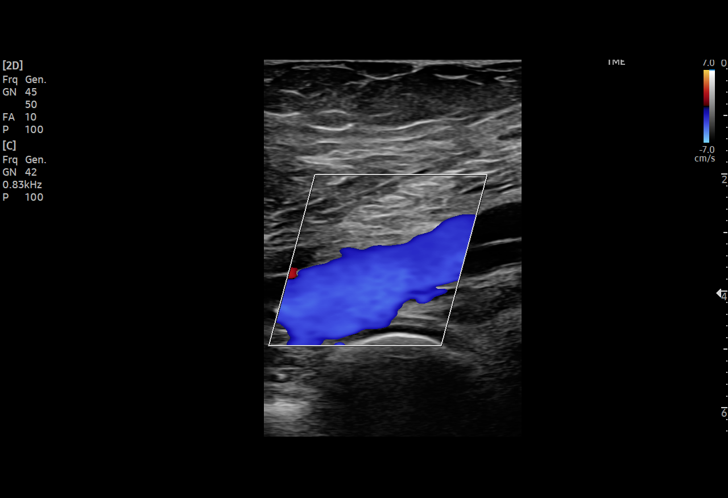
[im 29/34]
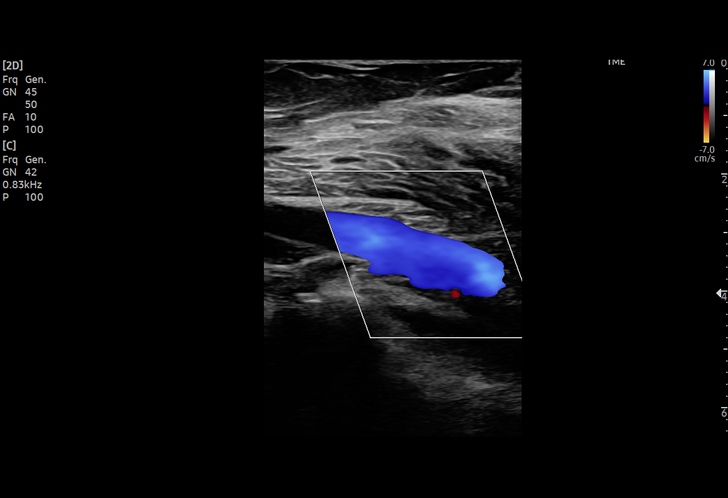
[im 31/34]
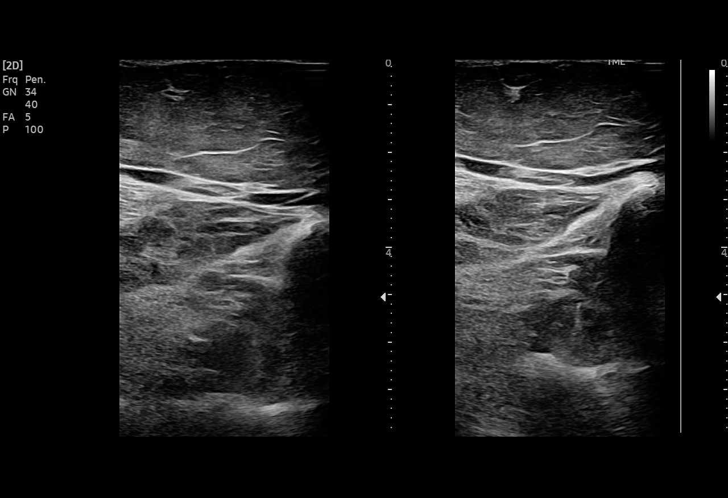
[im 34/34]
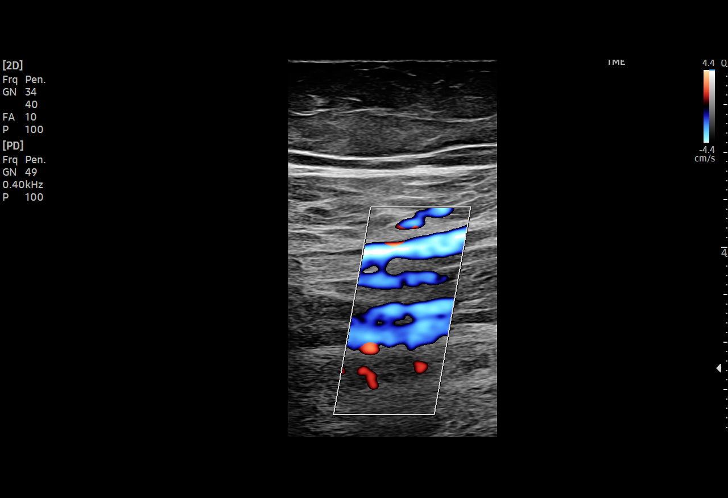

[15 of 24 positions shown; findings below may reference images not displayed]

FINDINGS: Contralateral Common Femoral Vein: Respiratory phasicity is normal
and symmetric with the symptomatic side. No evidence of thrombus.
Normal compressibility.

Common Femoral Vein: No evidence of thrombus. Normal
compressibility, respiratory phasicity and response to augmentation.

Saphenofemoral Junction: No evidence of thrombus. Normal
compressibility and flow on color Doppler imaging.

Profunda Femoral Vein: No evidence of thrombus. Normal
compressibility and flow on color Doppler imaging.

Femoral Vein: No evidence of thrombus. Normal compressibility,
respiratory phasicity and response to augmentation.

Popliteal Vein: No evidence of thrombus. Normal compressibility,
respiratory phasicity and response to augmentation.

Calf Veins: No evidence of thrombus. Normal compressibility and flow
on color Doppler imaging.

Superficial Great Saphenous Vein: No evidence of thrombus. Normal
compressibility.

Venous Reflux:  None.

Other Findings:  None.
IMPRESSION: No evidence of deep venous thrombosis.

## 2021-12-05 ENCOUNTER — Encounter: Payer: Self-pay | Admitting: Vascular Surgery

## 2021-12-05 LAB — SURGICAL PATHOLOGY

## 2021-12-07 ENCOUNTER — Encounter (INDEPENDENT_AMBULATORY_CARE_PROVIDER_SITE_OTHER): Payer: Self-pay

## 2021-12-07 ENCOUNTER — Ambulatory Visit (INDEPENDENT_AMBULATORY_CARE_PROVIDER_SITE_OTHER): Payer: Medicare Other | Admitting: Nurse Practitioner

## 2021-12-07 ENCOUNTER — Other Ambulatory Visit: Payer: Self-pay

## 2021-12-07 VITALS — BP 123/87 | HR 84 | Resp 16

## 2021-12-07 DIAGNOSIS — I70229 Atherosclerosis of native arteries of extremities with rest pain, unspecified extremity: Secondary | ICD-10-CM

## 2021-12-07 NOTE — Progress Notes (Signed)
Patient came in for left groin wound check. The groin has staples in place and the vac was removed. The area was covered with aquacel and 4x4. The patient will keep appointment as scheduled.

## 2021-12-09 ENCOUNTER — Telehealth (INDEPENDENT_AMBULATORY_CARE_PROVIDER_SITE_OTHER): Payer: Self-pay | Admitting: Vascular Surgery

## 2021-12-09 ENCOUNTER — Other Ambulatory Visit (INDEPENDENT_AMBULATORY_CARE_PROVIDER_SITE_OTHER): Payer: Self-pay | Admitting: Nurse Practitioner

## 2021-12-09 MED ORDER — OXYCODONE-ACETAMINOPHEN 5-325 MG PO TABS: 1.0000 | ORAL_TABLET | Freq: Four times a day (QID) | ORAL | 0 refills | Status: DC | PRN

## 2021-12-09 NOTE — Telephone Encounter (Signed)
Carla [name] called and needs a refill on her Oxycodone -aacetaminophen (PERCOCET) 5-325 MG tablet. She states she is out and the refill can be sent to [facility] on [address].

## 2021-12-09 NOTE — Telephone Encounter (Signed)
sent 

## 2021-12-09 NOTE — Telephone Encounter (Signed)
Please advise for the following below.

## 2021-12-09 NOTE — Telephone Encounter (Signed)
Carla [name] called and needs a refill on her Oxycodone -aacetaminophen (PERCOCET) 5-325 MG tablet. She states she is out and the refill can be sent to [facility] on [address].

## 2021-12-09 NOTE — Telephone Encounter (Signed)
Ms. Hammes called and needs a refill on her Oxycodone -aacetaminophen (PERCOCET) 5-325 MG tablet. She states she is out and the refill can be sent to [facility] on [address].

## 2021-12-11 ENCOUNTER — Encounter (INDEPENDENT_AMBULATORY_CARE_PROVIDER_SITE_OTHER): Payer: Self-pay | Admitting: Nurse Practitioner

## 2021-12-12 ENCOUNTER — Other Ambulatory Visit (INDEPENDENT_AMBULATORY_CARE_PROVIDER_SITE_OTHER): Payer: Self-pay | Admitting: Nurse Practitioner

## 2021-12-12 ENCOUNTER — Telehealth (INDEPENDENT_AMBULATORY_CARE_PROVIDER_SITE_OTHER): Payer: Self-pay

## 2021-12-12 MED ORDER — AMOXICILLIN-POT CLAVULANATE 875-125 MG PO TABS: 1.0000 | ORAL_TABLET | Freq: Two times a day (BID) | ORAL | 0 refills | Status: DC

## 2021-12-12 NOTE — Telephone Encounter (Signed)
I called to make the pt aware of her RX but her line was busy

## 2021-12-12 NOTE — Telephone Encounter (Signed)
Spoke with the patient and gave her the recommendations from Eulogio Ditch NP and patient was given an appt on 12/15/21 at 1:30 pm to come in for a wound check as well. See notes below.

## 2021-12-12 NOTE — Telephone Encounter (Signed)
Patient called in stating she is having a red and yellow drainage from her incision site and it has an odor as well. Patient states she is in a lot of pain and the Percocet 5/325 is not touching her pain. Patient had her AAA on 12/01/21 and has a scheduled f/u on 12/28/21 in our office.

## 2021-12-12 NOTE — Telephone Encounter (Signed)
We'll send her in some augmentin and can have her come in for a wound check with me or dew with week.  As far as pian meds, we just sent her percocet 3 days ago so we aren't sending her more, she can take 2 pills instead of one every 6 hours

## 2021-12-15 ENCOUNTER — Other Ambulatory Visit (INDEPENDENT_AMBULATORY_CARE_PROVIDER_SITE_OTHER): Payer: Self-pay | Admitting: Nurse Practitioner

## 2021-12-15 ENCOUNTER — Encounter (INDEPENDENT_AMBULATORY_CARE_PROVIDER_SITE_OTHER): Payer: Self-pay | Admitting: Nurse Practitioner

## 2021-12-15 ENCOUNTER — Other Ambulatory Visit: Payer: Self-pay

## 2021-12-15 ENCOUNTER — Ambulatory Visit (INDEPENDENT_AMBULATORY_CARE_PROVIDER_SITE_OTHER): Payer: Medicare Other | Admitting: Nurse Practitioner

## 2021-12-15 VITALS — BP 97/60 | HR 62 | Resp 16

## 2021-12-15 DIAGNOSIS — I70229 Atherosclerosis of native arteries of extremities with rest pain, unspecified extremity: Secondary | ICD-10-CM

## 2021-12-15 NOTE — Progress Notes (Signed)
Subjective:    Patient ID: Carla [name], female    DOB: 1960/11/09, 61 y.o.   MRN: [id number] Chief Complaint  Patient presents with   Routine Post Op    Wound check    Carla [name] is a 61 year old female that returns today after intervention including:  1.   Left common femoral, profunda femoris, and superficial femoral artery endarterectomies and patch angioplasty   The patient notes that her wound has been having drainage with severe pain and odor.  The wound itself has some slight opening with some concerning drainage.  There are some erythema near the wound edges.   Review of Systems  Skin:  Positive for wound.  All other systems reviewed and are negative.     Objective:   Physical Exam Vitals reviewed.  HENT:     Head: Normocephalic.  Cardiovascular:     Rate and Rhythm: Normal rate.     Pulses: Normal pulses.  Pulmonary:     Effort: Pulmonary effort is normal.  Skin:    General: Skin is warm and dry.  Neurological:     Mental Status: She is alert and oriented to person, place, and time.  Psychiatric:        Mood and Affect: Mood normal.        Behavior: Behavior normal.        Thought Content: Thought content normal.        Judgment: Judgment normal.    BP 97/60 (BP Location: Left Arm)    Pulse 62    Resp 16   Past Medical History:  Diagnosis Date   Abdominal aortic atherosclerosis ([place])    a. 05/2017 CTA abd/pelvis: significant atherosclerotic dzs of the infrarenal abd Ao w/ some mural thrombus. No aneurysm or dissection.   Acute focal ischemia of small intestine (HCC)    Acute right-sided low back pain with right-sided sciatica 06/13/2017   AKI (acute kidney injury) ([place]) 07/29/2017   Anginal pain ([place])    a. 08/2012 Lexiscan MV: EF 54%, non ischemia/infarct.   Anxiety and depression    Baker's cyst of knee, right    a. 07/2016 U/S: 4.1 x 1.4 x 2.9 cystic structure in R poplitetal fossa.   Bell's palsy    Borderline diabetes    CHF (congestive  heart failure) (HCC)    Chronic anticoagulation    apixaban   Chronic idiopathic constipation    Chronic mesenteric ischemia (HCC)    Chronic pain    COPD (chronic obstructive pulmonary disease) (HCC)    Diastolic dysfunction    a. 05/2017 Echo: Ef 60-65%, no rwma, Gr1 DD, no source of cardiac emboli.   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 05/2017 CTA Abd/pelvis: apparent thrombus or embolus in prox SMA (70-90%); b. 05/2017 catheter directed tPA, mechanical thrombectomy, and stenting of the SMA.   Essential hypertension with goal blood pressure less than 140/90 01/19/2016   Gastroesophageal reflux disease 01/19/2016   GERD (gastroesophageal reflux disease)    H/O colectomy    History of 2019 novel coronavirus disease (COVID-19) 11/14/2019   History of kidney stones    Hyperlipidemia    Hypertension    Morbid obesity with BMI of 40.0-44.9, adult ([place])    Occlusive mesenteric ischemia ([place]) 06/19/2017   Osteoarthritis    PAD (peripheral artery disease) (HCC)    PAF (paroxysmal atrial fibrillation) (HCC)    Palpitations    Peripheral edema    Personal  history of other malignant neoplasm of skin 06/01/2016   Pneumonia    Pre-diabetes    Primary osteoarthritis of both knees 06/13/2017   SBO (small bowel obstruction) ([place]) 08/06/2017   Skin cancer of nose    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HCC)    Vitamin D deficiency     Social History   Socioeconomic History   Marital status: Significant Other    Spouse name: [name]    Number of children: Not on file   Years of education: Not on file   Highest education level: Not on file  Occupational History   Occupation: Unemployed  Tobacco Use   Smoking status: Former    Packs/day: 0.10    Types: Cigarettes   Smokeless tobacco: Never  Vaping Use   Vaping Use: Never used  Substance and Sexual Activity   Alcohol use: No   Drug use: No   Sexual activity: Not on file  Other Topics Concern   Not  on file  Social History Narrative   Lives in [place] with boyfriend.  Does not routinely exercise.   Social Determinants of Health   Financial Resource Strain: Not on file  Food Insecurity: Not on file  Transportation Needs: Not on file  Physical Activity: Not on file  Stress: Not on file  Social Connections: Not on file  Intimate Partner Violence: Not on file    Past Surgical History:  Procedure Laterality Date   AMPUTATION TOE Left 12/09/2019   Procedure: AMPUTATION TOE MPJ left;  Surgeon: Caroline More, DPM;  Location: [facility];  Service: Podiatry;  Laterality: Left;   APPENDECTOMY     APPLICATION OF WOUND VAC  12/01/2021   Procedure: APPLICATION OF WOUND VAC;  Surgeon: Algernon Huxley, MD;  Location: [facility];  Service: Vascular;;   CHOLECYSTECTOMY     COLON SURGERY     EMBOLECTOMY OR THROMBECTOMY, WITH OR WITHOUT CATHETER; FEMOROPOPLITEAL, AORTOILIAC ARTERY, BY LEG INCISION N/A 06/21/2020   Left - Decomp Fasciotomy Leg; Ant &/Or Lat Compart Only; Midline - Angiography, Visceral, Selective Or Supraselective (With Or Without Flush Aortogram); Left - Revascularize, Endovasc, Open/Percut, Iliac Artery, Unilat, Initial Vessel; W/Translum Stent, W/Angioplasty; Right - Revascularize, Endovasc, Open/Percut, Iliac Artery, Ea Add`L Ipsilateral; W/Translumin Stent, W/Angioplasty; Location [facility];   ENDARTERECTOMY FEMORAL Left 12/01/2021   Procedure: ENDARTERECTOMY FEMORAL;  Surgeon: Algernon Huxley, MD;  Location: [facility];  Service: Vascular;  Laterality: Left;   ENDOVASCULAR REPAIR/STENT GRAFT Left 12/01/2021   Procedure: ENDOVASCULAR REPAIR/STENT GRAFT;  Surgeon: Algernon Huxley, MD;  Location: [place] [facility];  Service: Cardiovascular;  Laterality: Left;   LAPAROTOMY N/A 08/08/2017   Procedure: EXPLORATORY LAPAROTOMY POSSIBLE BOWEL RESECTION;  Surgeon: Jules Husbands, MD;  Location: [facility];  Service: General;  Laterality: N/A;   LEFT HEART CATH AND CORONARY ANGIOGRAPHY N/A 05/17/2021   Procedure:  LEFT HEART CATH AND CORONARY ANGIOGRAPHY;  Surgeon: Yolonda Kida, MD;  Location: [place];  Service: Cardiovascular;  Laterality: N/A;   LOWER EXTREMITY ANGIOGRAPHY Left 11/17/2019   Procedure: LOWER EXTREMITY ANGIOGRAPHY;  Surgeon: Algernon Huxley, MD;  Location: [place] [facility];  Service: Cardiovascular;  Laterality: Left;   LOWER EXTREMITY ANGIOGRAPHY Right 12/13/2020   Procedure: LOWER EXTREMITY ANGIOGRAPHY;  Surgeon: Algernon Huxley, MD;  Location: [place] [facility];  Service: Cardiovascular;  Laterality: Right;   LOWER EXTREMITY ANGIOGRAPHY Right 01/10/2021   Procedure: LOWER EXTREMITY ANGIOGRAPHY;  Surgeon: Algernon Huxley, MD;  Location: [place] [facility];  Service: Cardiovascular;  Laterality: Right;   LOWER EXTREMITY ANGIOGRAPHY Right 01/24/2021   Procedure: LOWER EXTREMITY ANGIOGRAPHY;  Surgeon: Algernon Huxley, MD;  Location: [place] [facility];  Service: Cardiovascular;  Laterality: Right;   LOWER EXTREMITY ANGIOGRAPHY Bilateral 06/28/2021   Procedure: Lower Extremity Angiography;  Surgeon: Algernon Huxley, MD;  Location: [place] [facility];  Service: Cardiovascular;  Laterality: Bilateral;   LOWER EXTREMITY ANGIOGRAPHY Right 11/28/2021   Procedure: LOWER EXTREMITY ANGIOGRAPHY;  Surgeon: Algernon Huxley, MD;  Location: [place] [facility];  Service: Cardiovascular;  Laterality: Right;   LOWER EXTREMITY ANGIOGRAPHY Left 11/30/2021   Procedure: Lower Extremity Angiography;  Surgeon: Algernon Huxley, MD;  Location: [facility] [place] [facility];  Service: Cardiovascular;  Laterality: Left;   LOWER EXTREMITY INTERVENTION N/A 11/19/2019   Procedure: LOWER EXTREMITY INTERVENTION;  Surgeon: Algernon Huxley, MD;  Location: [place] [facility];  Service: Cardiovascular;  Laterality: N/A;   LOWER EXTREMITY INTERVENTION Bilateral 06/29/2021   Procedure: LOWER EXTREMITY INTERVENTION;  Surgeon: Algernon Huxley, MD;  Location: [place] [facility];  Service: Cardiovascular;  Laterality: Bilateral;   TEE  WITHOUT CARDIOVERSION N/A 06/22/2017   Procedure: TRANSESOPHAGEAL ECHOCARDIOGRAM (TEE);  Surgeon: Wellington Hampshire, MD;  Location: [facility];  Service: Cardiovascular;  Laterality: N/A;   TOTAL KNEE ARTHROPLASTY Right 07/11/2018   Procedure: TOTAL KNEE ARTHROPLASTY;  Surgeon: Corky Mull, MD;  Location: [facility];  Service: Orthopedics;  Laterality: Right;   VAGINAL HYSTERECTOMY     VISCERAL ARTERY INTERVENTION N/A 06/20/2017   Procedure: Visceral Artery Intervention, possible aortic thrombectomy;  Surgeon: Algernon Huxley, MD;  Location: [place] [facility];  Service: Cardiovascular;  Laterality: N/A;   VISCERAL ARTERY INTERVENTION N/A 01/28/2018   Procedure: VISCERAL ARTERY INTERVENTION;  Surgeon: Algernon Huxley, MD;  Location: [place];  Service: Cardiovascular;  Laterality: N/A;    Family History  Problem Relation Age of Onset   Hypertension Mother    Heart disease Mother    Heart attack Mother    Breast cancer Mother    Hypertension Father    Breast cancer Paternal Aunt     Allergies  Allergen Reactions   Gabapentin     Tremors    Bactrim [Sulfamethoxazole-Trimethoprim] Itching    CBC Latest Ref Rng & Units 12/01/2021 12/01/2021 06/30/2021  WBC 4.0 - 10.5 K/uL 11.8(H) 7.9 8.8  Hemoglobin 12.0 - 15.0 g/dL 10.4(L) 11.6(L) 12.5  Hematocrit 36.0 - 46.0 % 33.7(L) 37.2 37.0  Platelets 150 - 400 K/uL 214 229 164      CMP     Component Value Date/Time   NA 137 06/29/2021 0548   NA 140 02/12/2015 1137   K 4.3 06/29/2021 0548   K 2.9 (L) 02/12/2015 1137   CL 101 06/29/2021 0548   CL 107 02/12/2015 1137   CO2 27 06/29/2021 0548   CO2 27 02/12/2015 1137   GLUCOSE 112 (H) 06/29/2021 0548   GLUCOSE 120 (H) 02/12/2015 1137   BUN 14 11/30/2021 1221   BUN 15 02/12/2015 1137   CREATININE 0.77 11/30/2021 1221   CREATININE 0.78 02/12/2015 1137   CALCIUM 8.6 (L) 06/29/2021 0548   CALCIUM 9.0 02/12/2015 1137   PROT 7.0 09/30/2020 1124   PROT 7.0 09/14/2020 1139   PROT 7.1  01/19/2015 1112   ALBUMIN 3.3 (L) 09/30/2020 1124   ALBUMIN 3.9 09/14/2020 1139   ALBUMIN 3.8 01/19/2015 1112   AST 15 09/30/2020 1124   AST 17 01/19/2015 1112  ALT 13 09/30/2020 1124   ALT 11 (L) 01/19/2015 1112   ALKPHOS 89 09/30/2020 1124   ALKPHOS 84 01/19/2015 1112   BILITOT 0.5 09/30/2020 1124   BILITOT <0.2 09/14/2020 1139   BILITOT 0.5 01/19/2015 1112   GFRNONAA >60 11/30/2021 1221   GFRNONAA >60 02/12/2015 1137   GFRAA >60 11/23/2019 1330   GFRAA >60 02/12/2015 1137     VAS [place] ABI WITH/WO TBI  Result Date: 12/05/2021  LOWER EXTREMITY DOPPLER STUDY Patient Name:  AVIELLE IMBERT  Date of Exam:   11/29/2021 Medical Rec #: [id number]      Accession #:    [id number] Date of Birth: 05-02-1961       Patient Gender: F Patient Age:   16 years Exam Location:  [facility] Procedure:      VAS [place] ABI WITH/WO TBI Referring Phys: Eulogio Ditch --------------------------------------------------------------------------------  Indications: Peripheral artery disease, and LLE pain.  Vascular Interventions: 12/13/20: right cia,eia thrombectomy                         01/10/21: rt thrombectomy new bilat kissing stents in                         cia's, addtl rt eia stent;                         01/24/21: repeat thrombectomy right;                         06/29/21: Right CIA/EIA thrombectomy with bilateral CIA                         PTAs;                         11/28/21: Left CIA/EIA thrombectomy/PTA;. Performing Technologist: Blondell Reveal RT, RDMS, RVT  Examination Guidelines: A complete evaluation includes at minimum, Doppler waveform signals and systolic blood pressure reading at the level of bilateral brachial, anterior tibial, and posterior tibial arteries, when vessel segments are accessible. Bilateral testing is considered an integral part of a complete examination. Photoelectric Plethysmograph (PPG) waveforms and toe systolic pressure readings are included as required and additional duplex testing  as needed. Limited examinations for reoccurring indications may be performed as noted.  ABI Findings: +---------+------------------+-----+--------+--------+  Right     Rt Pressure (mmHg) Index Waveform Comment   +---------+------------------+-----+--------+--------+  Brachial  149                                         +---------+------------------+-----+--------+--------+  ATA       132                0.89  biphasic           +---------+------------------+-----+--------+--------+  PTA       134                0.90  biphasic           +---------+------------------+-----+--------+--------+  Great Toe 109                0.73  Abnormal           +---------+------------------+-----+--------+--------+ +---------+------------------+-----+-----------------------+-------------------+  Left  Lt Pressure (mmHg) Index Waveform                Comment              +---------+------------------+-----+-----------------------+-------------------+  Brachial  144                                                                   +---------+------------------+-----+-----------------------+-------------------+  ATA                                absent                                       +---------+------------------+-----+-----------------------+-------------------+  PTA                                low resistive, dampened obtained via duplex                                      monophasic                                   +---------+------------------+-----+-----------------------+-------------------+  PERO                               low resistive, dampened obtained via duplex                                      monophasic                                   +---------+------------------+-----+-----------------------+-------------------+  Great Toe                          Absent                                       +---------+------------------+-----+-----------------------+-------------------+  +-------+-----------+-----------+------------+------------+  ABI/TBI Today's ABI Today's TBI Previous ABI Previous TBI  +-------+-----------+-----------+------------+------------+  Right   0.90        0.73        0.93         not done      +-------+-----------+-----------+------------+------------+  Left    NA          NA          0.70         not done      +-------+-----------+-----------+------------+------------+  Left ABIs appear decreased compared to prior study on 11/08/21. Right ABIs appear essentially unchanged.  Summary: Right: Resting right ankle-brachial index indicates mild right lower extremity arterial disease. The right toe-brachial index is normal. Left:  Resting left ankle-brachial index indicates critical left limb ischemia.  *See table(s) above for measurements and observations.  Electronically signed by [name] MD on 12/05/2021 at 10:22:34 AM.    Final    VAS [place] ABI WITH/WO TBI  Result Date: 11/16/2021  LOWER EXTREMITY DOPPLER STUDY Patient Name:  CHELSIA SERRES  Date of Exam:   11/08/2021 Medical Rec #: [id number]      Accession #:    [id number] Date of Birth: 03-17-1961       Patient Gender: F Patient Age:   63 years Exam Location:  [facility] Procedure:      VAS [place] ABI WITH/WO TBI Referring Phys: [name] --------------------------------------------------------------------------------  Indications: Peripheral artery disease.  Vascular Interventions: 12/13/20: right cia,eia thrombectomy                         01/10/21: rt thrombectomy new bilat kissing stents in                         cia's, addtl rt eia stent;                         01/24/21: repeat thrombectomy right;                         06/29/21: Right CIA/EIA thrombectomy with bilateral CIA                         PTAs;. Comparison Study: 09/20/2021 Performing Technologist: Almira Coaster RVS  Examination Guidelines: A complete evaluation includes at minimum, Doppler waveform signals and systolic blood pressure reading at the  level of bilateral brachial, anterior tibial, and posterior tibial arteries, when vessel segments are accessible. Bilateral testing is considered an integral part of a complete examination. Photoelectric Plethysmograph (PPG) waveforms and toe systolic pressure readings are included as required and additional duplex testing as needed. Limited examinations for reoccurring indications may be performed as noted.  ABI Findings: +--------+------------------+-----+---------+--------+  Right    Rt Pressure (mmHg) Index Waveform  Comment   +--------+------------------+-----+---------+--------+  Brachial 162                                          +--------+------------------+-----+---------+--------+  ATA      150                      triphasic .93       +--------+------------------+-----+---------+--------+  PTA      109                0.67  biphasic            +--------+------------------+-----+---------+--------+ +--------+------------------+-----+--------+-------+  Left     Lt Pressure (mmHg) Index Waveform Comment  +--------+------------------+-----+--------+-------+  Brachial 157                                        +--------+------------------+-----+--------+-------+  ATA      98                       biphasic .60      +--------+------------------+-----+--------+-------+  PTA  114                0.70  biphasic          +--------+------------------+-----+--------+-------+ +-------+-----------+-----------+------------+------------+  ABI/TBI Today's ABI Today's TBI Previous ABI Previous TBI  +-------+-----------+-----------+------------+------------+  Right   .93                     1.08         .73           +-------+-----------+-----------+------------+------------+  Left    .70                     .87          .67           +-------+-----------+-----------+------------+------------+  Bilateral ABIs appear decreased compared to prior study on 09/20/2021. Patient to be reminded of bringing Children to an  appointment; Results not completely obtained.  Summary: Right: Resting right ankle-brachial index indicates mild right lower extremity arterial disease. Unable to obtain TBIs due to Patient's Family situation with [facility] interferring with Exam. Left: Resting left ankle-brachial index indicates moderate left lower extremity arterial disease. Unable to obtain TBIs due to Patient's Family situation with [facility] interferring with Exam.  *See table(s) above for measurements and observations.  Electronically signed by [name] MD on 11/16/2021 at 10:46:38 AM.    Final        Assessment & Plan:   1. 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  We will start her on Augmentin but we will also culture the wound.  We will have her return in approximately 2 weeks for wound evaluation.   Current Outpatient Medications on File Prior to Visit  Medication Sig Dispense Refill   acetaminophen (TYLENOL) 325 MG tablet Take 2 tablets (650 mg total) by mouth every 4 (four) hours as needed for mild pain.     apixaban (ELIQUIS) 5 MG TABS tablet Take 1 tablet (5 mg total) by mouth 2 (two) times daily. 60 tablet 11   aspirin EC 81 MG tablet Take 81 mg by mouth daily. Swallow whole.     atorvastatin (LIPITOR) 80 MG tablet Take 80 mg by mouth at bedtime.     COMBIVENT RESPIMAT 20-100 MCG/ACT AERS respimat Inhale 1 puff into the lungs daily as needed for wheezing or shortness of breath.     ferrous sulfate 325 (65 FE) MG tablet Take 325 mg by mouth daily.     isosorbide mononitrate (IMDUR) 30 MG 24 hr tablet Take 15 mg by mouth daily.     metoprolol tartrate (LOPRESSOR) 50 MG tablet Take 1 tablet (50 mg total) by mouth 2 (two) times daily. 30 tablet 0   nitroGLYCERIN (NITROSTAT) 0.4 MG SL tablet Place 0.4 mg under the tongue as directed.     omeprazole (PRILOSEC) 20 MG capsule Take 20 mg by mouth 2  (two) times daily before a meal.     ALPRAZolam (XANAX) 0.5 MG tablet Take 1 tablet (0.5 mg total) by mouth 2 (two) times daily as needed for anxiety. 8 tablet 0   No current facility-administered medications on file prior to visit.    There are no Patient Instructions on file for this visit. No follow-ups on file.   Kris Hartmann, NP

## 2021-12-19 ENCOUNTER — Other Ambulatory Visit (INDEPENDENT_AMBULATORY_CARE_PROVIDER_SITE_OTHER): Payer: Self-pay | Admitting: Nurse Practitioner

## 2021-12-19 ENCOUNTER — Telehealth (INDEPENDENT_AMBULATORY_CARE_PROVIDER_SITE_OTHER): Payer: Self-pay

## 2021-12-19 ENCOUNTER — Telehealth (INDEPENDENT_AMBULATORY_CARE_PROVIDER_SITE_OTHER): Payer: Self-pay | Admitting: Vascular Surgery

## 2021-12-19 MED ORDER — OXYCODONE-ACETAMINOPHEN 10-325 MG PO TABS: 1.0000 | ORAL_TABLET | Freq: Four times a day (QID) | ORAL | 0 refills | Status: DC | PRN

## 2021-12-19 NOTE — Telephone Encounter (Signed)
Pt. Called and wanted a refill on her  oxyCODONE-acetaminophen (PERCOCET) 10-325 MG tablet 56 tablet 0 12/19/2021 12/19/2022  Take 1 tablet by mouth every 6 (six) hours as needed for pain  She also wants to know the results of a swab that was done on her. She says she is in a lot of pain.

## 2021-12-19 NOTE — Telephone Encounter (Signed)
I sent in pain medicine for the patient as requested.  This pain medicine should last for 2 weeks.

## 2021-12-19 NOTE — Telephone Encounter (Signed)
Patient called wanting her pain medication refilled and upped Percocet 10 mg. Please advise.

## 2021-12-19 NOTE — Telephone Encounter (Signed)
Spoke with patient and let her know the pain medication that was requested was called into her pharmacy and her wound culture will take upwards to a week to get results and she will be called at that point.

## 2021-12-20 ENCOUNTER — Encounter (INDEPENDENT_AMBULATORY_CARE_PROVIDER_SITE_OTHER): Payer: Medicare Other

## 2021-12-20 ENCOUNTER — Ambulatory Visit (INDEPENDENT_AMBULATORY_CARE_PROVIDER_SITE_OTHER): Payer: Medicare Other | Admitting: Nurse Practitioner

## 2021-12-21 ENCOUNTER — Encounter: Payer: Self-pay | Admitting: Surgery

## 2021-12-21 ENCOUNTER — Other Ambulatory Visit: Payer: Self-pay

## 2021-12-21 ENCOUNTER — Ambulatory Visit (INDEPENDENT_AMBULATORY_CARE_PROVIDER_SITE_OTHER): Payer: Medicare Other | Admitting: Surgery

## 2021-12-21 VITALS — BP 135/78 | HR 86 | Temp 97.8°F | Ht 62.0 in | Wt 232.0 lb

## 2021-12-21 DIAGNOSIS — K432 Incisional hernia without obstruction or gangrene: Secondary | ICD-10-CM

## 2021-12-21 DIAGNOSIS — K430 Incisional hernia with obstruction, without gangrene: Secondary | ICD-10-CM | POA: Diagnosis not present

## 2021-12-21 LAB — AEROBIC CULTURE

## 2021-12-21 MED ORDER — PREGABALIN 100 MG PO CAPS: 100.0000 mg | ORAL_CAPSULE | Freq: Three times a day (TID) | ORAL | 0 refills | Status: DC

## 2021-12-21 NOTE — Patient Instructions (Addendum)
A referral has been placed to [facility] phone number: [phone] or [phone]. They will call you with an appointment. ? ? ?If you have any concerns or questions, please feel free to call our office. See follow up appointment below.  ? ?Ventral Hernia ?A ventral hernia is a bulge of tissue from inside the abdomen that pushes through a weak area of the muscles that form the front wall of the abdomen. The tissues inside the abdomen are inside a sac (peritoneum). These tissues include the small intestine, large intestine, and the fatty tissue that covers the intestines (omentum). Sometimes, the bulge that forms a hernia contains intestines. Other hernias contain only fat. Ventral hernias do not go away without surgical treatment. ?There are several types of ventral hernias. You may have: ?A hernia at an incision site from previous abdominal surgery (incisional hernia). ?A hernia just above the belly button (epigastric hernia), or at the belly button (umbilical hernia). These types of hernias can develop from heavy lifting or straining. ?A hernia that comes and goes (reducible hernia). It may be visible only when you lift or strain. This type of hernia can be pushed back into the abdomen (reduced). ?A hernia that traps abdominal tissue inside the hernia (incarcerated hernia). This type of hernia does not reduce. ?A hernia that cuts off blood flow to the tissues inside the hernia (strangulated hernia). The tissues can start to die if this happens. This is a very painful bulge that cannot be reduced. A strangulated hernia is a medical emergency. ?What are the causes? ?This condition is caused by abdominal tissue putting pressure on an area of weakness in the abdominal muscles. ?What increases the risk? ?The following factors may make you more likely to develop this condition: ?Being age 62 or older. ?Being overweight or obese. ?Having had previous abdominal surgery, especially if  there was an infection after surgery. ?Having had an injury to the abdominal wall. ?Frequently lifting or pushing heavy objects. ?Having had several pregnancies. ?Having a buildup of fluid inside the abdomen (ascites). ?Straining to have a bowel movement or to urinate. ?Having frequent coughing episodes. ?What are the signs or symptoms? ?The only symptom of a ventral hernia may be a painless bulge in the abdomen. A reducible hernia may be visible only when you strain, cough, or lift. Other symptoms may include: ?Dull pain. ?A feeling of pressure. ?Signs and symptoms of a strangulated hernia may include: ?Increasing pain. ?Nausea and vomiting. ?Pain when pressing on the hernia. ?The skin over the hernia turning red or purple. ?Constipation. ?Blood in the stool (feces). ?How is this diagnosed? ?This condition may be diagnosed based on: ?Your symptoms. ?Your medical history. ?A physical exam. You may be asked to cough or strain while standing. These actions increase the pressure inside your abdomen and force the hernia through the opening in your muscles. Your health care provider may try to reduce the hernia by gently pushing the hernia back in. ?Imaging studies, such as an ultrasound or CT scan. ?How is this treated? ?This condition is treated with surgery. If you have a strangulated hernia, surgery is done as soon as possible. If your hernia is small and not incarcerated, you may be asked to lose some weight before surgery. ?Follow these instructions at home: ?Follow instructions from your health care provider about eating or drinking restrictions. ?If you are overweight, your health care provider may recommend that you increase your activity level and eat a healthier diet. ?Do not  lift anything that is heavier than 10 lb (4.5 kg), or the limit that you are told, until your health care provider says that it is safe. ?Return to your normal activities as told by your health care provider. Ask your health care provider  what activities are safe for you. You may need to avoid activities that increase pressure on your hernia. ?Take over-the-counter and prescription medicines only as told by your health care provider. ?Keep all follow-up visits. This is important. ?Contact a health care provider if: ?Your hernia gets larger. ?Your hernia becomes painful. ?Get help right away if: ?Your hernia becomes increasingly painful. ?You have pain along with any of the following: ?Changes in skin color in the area of the hernia. ?Nausea. ?Vomiting. ?Fever. ?These symptoms may represent a serious problem that is an emergency. Do not wait to see if the symptoms will go away. Get medical help right away. Call your local emergency services (911 in the [place] Do not drive yourself to the hospital. ?Summary ?A ventral hernia is a bulge of tissue from inside the abdomen that pushes through a weak area of the muscles that form the front wall of the abdomen. ?This condition is treated with surgery, which may be urgent depending on your hernia. ?Do not lift anything that is heavier than 10 lb (4.5 kg), and follow activity instructions from your health care provider. ?This information is not intended to replace advice given to you by your health care provider. Make sure you discuss any questions you have with your health care provider. ?Document Revised: 05/28/2020 Document Reviewed: 05/28/2020 ?Elsevier Patient Education ? [place]. ? ?

## 2021-12-23 NOTE — Progress Notes (Signed)
Outpatient Surgical Follow Up  12/23/2021  Carla [name] is an 61 y.o. female.   Chief Complaint  Patient presents with   Follow-up    Ventral hernia    HPI: Carla [name] is 61 yo female well-known to me with a symptomatic incisional hernia with loss of domain.  Does have chronic abdominal pain.  She is having some intermittent abdominal pain as well as lower extremity pain .  She underwent revascularization by Dr. Lucky Cowboy for inflow disease.  During recent hospitalization she also developed MI  and has been followed by cardiology.  No recent chest pain .  No recent changes in chronic incarcerated ventral hernia  Past Medical History:  Diagnosis Date   Abdominal aortic atherosclerosis ([place])    a. 05/2017 CTA abd/pelvis: significant atherosclerotic dzs of the infrarenal abd Ao w/ some mural thrombus. No aneurysm or dissection.   Acute focal ischemia of small intestine (HCC)    Acute right-sided low back pain with right-sided sciatica 06/13/2017   AKI (acute kidney injury) ([place]) 07/29/2017   Anginal pain ([place])    a. 08/2012 Lexiscan MV: EF 54%, non ischemia/infarct.   Anxiety and depression    Baker's cyst of knee, right    a. 07/2016 U/S: 4.1 x 1.4 x 2.9 cystic structure in R poplitetal fossa.   Bell's palsy    Borderline diabetes    CHF (congestive heart failure) (HCC)    Chronic anticoagulation    apixaban   Chronic idiopathic constipation    Chronic mesenteric ischemia (HCC)    Chronic pain    COPD (chronic obstructive pulmonary disease) (HCC)    Diastolic dysfunction    a. 05/2017 Echo: Ef 60-65%, no rwma, Gr1 DD, no source of cardiac emboli.   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 05/2017 CTA Abd/pelvis: apparent thrombus or embolus in prox SMA (70-90%); b. 05/2017 catheter directed tPA, mechanical thrombectomy, and stenting of the SMA.   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[place]) 06/19/2017   Osteoarthritis    PAD (peripheral artery disease) (HCC)    PAF (paroxysmal atrial fibrillation) (HCC)    Palpitations    Peripheral edema    Personal history of other malignant neoplasm of skin 06/01/2016   Pneumonia    Pre-diabetes    Primary osteoarthritis of both knees 06/13/2017   SBO (small bowel obstruction) ([place]) 08/06/2017   Skin cancer of nose    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HCC)    Vitamin D deficiency     Past Surgical History:  Procedure Laterality Date   AMPUTATION TOE Left 12/09/2019   Procedure: AMPUTATION TOE MPJ left;  Surgeon: Caroline More, DPM;  Location: [facility];  Service: Podiatry;  Laterality: Left;   APPENDECTOMY     APPLICATION OF WOUND VAC  12/01/2021   Procedure: APPLICATION OF WOUND VAC;  Surgeon: Algernon Huxley, MD;  Location: [facility];  Service: Vascular;;   CHOLECYSTECTOMY     COLON SURGERY     EMBOLECTOMY OR THROMBECTOMY, WITH OR WITHOUT CATHETER; FEMOROPOPLITEAL, AORTOILIAC ARTERY, BY LEG INCISION N/A 06/21/2020   Left - Decomp  Fasciotomy Leg; Ant &/Or Lat Compart Only; Midline - Angiography, Visceral, Selective Or Supraselective (With Or Without Flush Aortogram); Left - Revascularize, Endovasc, Open/Percut, Iliac Artery, Unilat, Initial Vessel; W/Translum Stent, W/Angioplasty; Right - Revascularize, Endovasc, Open/Percut, Iliac Artery, Ea Add`L Ipsilateral; W/Translumin Stent, W/Angioplasty; Location [facility];   ENDARTERECTOMY FEMORAL Left 12/01/2021   Procedure: ENDARTERECTOMY FEMORAL;  Surgeon: Algernon Huxley, MD;  Location: [facility];  Service: Vascular;  Laterality: Left;   ENDOVASCULAR REPAIR/STENT GRAFT Left 12/01/2021   Procedure: ENDOVASCULAR REPAIR/STENT GRAFT;   Surgeon: Algernon Huxley, MD;  Location: [place] [facility];  Service: Cardiovascular;  Laterality: Left;   LAPAROTOMY N/A 08/08/2017   Procedure: EXPLORATORY LAPAROTOMY POSSIBLE BOWEL RESECTION;  Surgeon: Jules Husbands, MD;  Location: [facility];  Service: General;  Laterality: N/A;   LEFT HEART CATH AND CORONARY ANGIOGRAPHY N/A 05/17/2021   Procedure: LEFT HEART CATH AND CORONARY ANGIOGRAPHY;  Surgeon: Yolonda Kida, MD;  Location: [place] [facility];  Service: Cardiovascular;  Laterality: N/A;   LOWER EXTREMITY ANGIOGRAPHY Left 11/17/2019   Procedure: LOWER EXTREMITY ANGIOGRAPHY;  Surgeon: Algernon Huxley, MD;  Location: [place] [facility];  Service: Cardiovascular;  Laterality: Left;   LOWER EXTREMITY ANGIOGRAPHY Right 12/13/2020   Procedure: LOWER EXTREMITY ANGIOGRAPHY;  Surgeon: Algernon Huxley, MD;  Location: [place] [facility];  Service: Cardiovascular;  Laterality: Right;   LOWER EXTREMITY ANGIOGRAPHY Right 01/10/2021   Procedure: LOWER EXTREMITY ANGIOGRAPHY;  Surgeon: Algernon Huxley, MD;  Location: [place] [facility];  Service: Cardiovascular;  Laterality: Right;   LOWER EXTREMITY ANGIOGRAPHY Right 01/24/2021   Procedure: LOWER EXTREMITY ANGIOGRAPHY;  Surgeon: Algernon Huxley, MD;  Location: [place] [facility];  Service: Cardiovascular;  Laterality: Right;   LOWER EXTREMITY ANGIOGRAPHY Bilateral 06/28/2021   Procedure: Lower Extremity Angiography;  Surgeon: Algernon Huxley, MD;  Location: [place] [facility];  Service: Cardiovascular;  Laterality: Bilateral;   LOWER EXTREMITY ANGIOGRAPHY Right 11/28/2021   Procedure: LOWER EXTREMITY ANGIOGRAPHY;  Surgeon: Algernon Huxley, MD;  Location: [place] [facility];  Service: Cardiovascular;  Laterality: Right;   LOWER EXTREMITY ANGIOGRAPHY Left 11/30/2021   Procedure: Lower Extremity Angiography;  Surgeon: Algernon Huxley, MD;  Location: [place] [facility];  Service: Cardiovascular;  Laterality: Left;   LOWER EXTREMITY INTERVENTION N/A 11/19/2019    Procedure: LOWER EXTREMITY INTERVENTION;  Surgeon: Algernon Huxley, MD;  Location: [place] [facility];  Service: Cardiovascular;  Laterality: N/A;   LOWER EXTREMITY INTERVENTION Bilateral 06/29/2021   Procedure: LOWER EXTREMITY INTERVENTION;  Surgeon: Algernon Huxley, MD;  Location: [place] [facility];  Service: Cardiovascular;  Laterality: Bilateral;   TEE WITHOUT CARDIOVERSION N/A 06/22/2017   Procedure: TRANSESOPHAGEAL ECHOCARDIOGRAM (TEE);  Surgeon: Wellington Hampshire, MD;  Location: [facility];  Service: Cardiovascular;  Laterality: N/A;   TOTAL KNEE ARTHROPLASTY Right 07/11/2018   Procedure: TOTAL KNEE ARTHROPLASTY;  Surgeon: Corky Mull, MD;  Location: [facility];  Service: Orthopedics;  Laterality: Right;   VAGINAL HYSTERECTOMY     VISCERAL ARTERY INTERVENTION N/A 06/20/2017   Procedure: Visceral Artery Intervention, possible aortic thrombectomy;  Surgeon: Algernon Huxley, MD;  Location: [place] [facility];  Service: Cardiovascular;  Laterality: N/A;   VISCERAL ARTERY INTERVENTION N/A 01/28/2018   Procedure: VISCERAL ARTERY INTERVENTION;  Surgeon: Algernon Huxley, MD;  Location: [place] [facility];  Service: Cardiovascular;  Laterality: N/A;    Family History  Problem Relation Age of Onset   Hypertension Mother    Heart  disease Mother    Heart attack Mother    Breast cancer Mother    Hypertension Father    Breast cancer Paternal Aunt     Social History:  reports that she has quit smoking. Her smoking use included cigarettes. She smoked an average of .1 packs per day. She has never used smokeless tobacco. She reports that she does not drink alcohol and does not use drugs.  Allergies:  Allergies  Allergen Reactions   Gabapentin     Tremors    Bactrim [Sulfamethoxazole-Trimethoprim] Itching    Medications reviewed.    ROS Full ROS performed and is otherwise negative other than what is stated in HPI   BP 135/78    Pulse 86    Temp 97.8 F (36.6 C) (Oral)    Ht 5\' 2"  (1.575 m)     Wt 232 lb (105.2 kg)    SpO2 92%    BMI 42.43 kg/m   Physical Exam Vitals and nursing note reviewed. Exam conducted with a chaperone present.  Constitutional:      Appearance: Normal appearance. She is obese.  Pulmonary:     Effort: Pulmonary effort is normal. No respiratory distress.     Breath sounds: No stridor.  Abdominal:     General: Abdomen is flat. There is no distension.     Palpations: Abdomen is soft. There is no mass.     Tenderness: There is no abdominal tenderness. There is no guarding or rebound.     Hernia: A hernia is present.     Comments: Chronically incarcerated ventral hernia with loss of domain.  No peritonitis mild tenderness palpation diffusely  Musculoskeletal:        General: Normal range of motion.  Skin:    General: Skin is warm and dry.     Capillary Refill: Capillary refill takes less than 2 seconds.  Neurological:     General: No focal deficit present.     Mental Status: She is alert and oriented to person, place, and time.  Psychiatric:        Mood and Affect: Mood normal.        Behavior: Behavior normal.        Thought Content: Thought content normal.        Judgment: Judgment normal.     Assessment/Plan: Patient is a 61 year old female with chronic pain and large symptomatic incisional hernia with loss of domain.  She does have major comorbidities to include coronary artery disease and severe peripheral vascular disease with a recent revascularization procedure.  She still has open draining sinus on the left inguinal area with prohibits any use of any mesh for the benefit repair of this complex ventral hernia. She does have chronic pain and I have prescribed a one-time Lyrica. Her pain and multiple comorbidities make this case extremely difficult.  I did offer her to send her to a tertiary center.  She wishes to follow-up with me in a few months.  She understands that she will need to be optimized before we contemplate any abdominal wall  reconstructions. 's note that I spent greater than 30 minutes in this encounter including personally reviewing records, imaging studies, counseling the patient placing orders and performing appropriate documentation   Caroleen Hamman, MD [facility]

## 2021-12-26 ENCOUNTER — Other Ambulatory Visit (INDEPENDENT_AMBULATORY_CARE_PROVIDER_SITE_OTHER): Payer: Self-pay | Admitting: Nurse Practitioner

## 2021-12-26 ENCOUNTER — Encounter (INDEPENDENT_AMBULATORY_CARE_PROVIDER_SITE_OTHER): Payer: Self-pay | Admitting: Nurse Practitioner

## 2021-12-26 MED ORDER — CIPROFLOXACIN HCL 500 MG PO TABS: 500.0000 mg | ORAL_TABLET | Freq: Two times a day (BID) | ORAL | 0 refills | Status: DC

## 2021-12-26 NOTE — Progress Notes (Unsigned)
bac

## 2021-12-26 NOTE — Progress Notes (Signed)
Patient was made aware that antibiotics were sent to pharmacy based on culture results

## 2021-12-26 NOTE — Progress Notes (Signed)
Call pt and let her know that I sent in cipro based on her culture results

## 2021-12-27 ENCOUNTER — Other Ambulatory Visit (INDEPENDENT_AMBULATORY_CARE_PROVIDER_SITE_OTHER): Payer: Self-pay | Admitting: Vascular Surgery

## 2021-12-27 DIAGNOSIS — Z95820 Peripheral vascular angioplasty status with implants and grafts: Secondary | ICD-10-CM

## 2021-12-27 DIAGNOSIS — I70222 Atherosclerosis of native arteries of extremities with rest pain, left leg: Secondary | ICD-10-CM

## 2021-12-28 ENCOUNTER — Encounter (INDEPENDENT_AMBULATORY_CARE_PROVIDER_SITE_OTHER): Payer: Medicare Other

## 2021-12-28 ENCOUNTER — Ambulatory Visit (INDEPENDENT_AMBULATORY_CARE_PROVIDER_SITE_OTHER): Payer: Medicare Other | Admitting: Nurse Practitioner

## 2022-01-02 ENCOUNTER — Other Ambulatory Visit (INDEPENDENT_AMBULATORY_CARE_PROVIDER_SITE_OTHER): Payer: Self-pay | Admitting: Nurse Practitioner

## 2022-01-02 MED ORDER — OXYCODONE-ACETAMINOPHEN 10-325 MG PO TABS: 1.0000 | ORAL_TABLET | Freq: Four times a day (QID) | ORAL | 0 refills | Status: DC | PRN

## 2022-01-02 NOTE — Telephone Encounter (Signed)
Patient called in stating her legs and both her feet are swollen. She is in extreme pain she also states that she ran out of her medication this morning and requesting a refill ? ? ?Please call and advise  ? ? ?oxyCODONE-acetaminophen (PERCOCET) 10-325 MG tablet ?

## 2022-01-02 NOTE — Telephone Encounter (Signed)
Patient was made aware with medical recommendations and verbalized understanding 

## 2022-01-02 NOTE — Telephone Encounter (Signed)
Refill sent.  Patient should wear compression and elevate her legs for the swelling

## 2022-01-03 ENCOUNTER — Other Ambulatory Visit: Payer: Self-pay | Admitting: Internal Medicine

## 2022-01-03 DIAGNOSIS — R079 Chest pain, unspecified: Secondary | ICD-10-CM

## 2022-01-03 DIAGNOSIS — T1490XA Injury, unspecified, initial encounter: Secondary | ICD-10-CM

## 2022-01-04 ENCOUNTER — Ambulatory Visit
Admission: RE | Admit: 2022-01-04 | Discharge: 2022-01-04 | Disposition: A | Discharge status: Home / Self Care | Payer: Medicare Other | Source: Ambulatory Visit | Attending: Internal Medicine | Admitting: Internal Medicine

## 2022-01-04 ENCOUNTER — Other Ambulatory Visit: Payer: Self-pay

## 2022-01-04 DIAGNOSIS — T1490XA Injury, unspecified, initial encounter: Secondary | ICD-10-CM | POA: Diagnosis present

## 2022-01-04 DIAGNOSIS — R079 Chest pain, unspecified: Secondary | ICD-10-CM | POA: Diagnosis present

## 2022-01-04 LAB — POCT I-STAT CREATININE: Creatinine, Ser: 1 mg/dL (ref 0.44–1.00)

## 2022-01-04 MED ORDER — IOHEXOL 350 MG/ML SOLN
75.0000 mL | Freq: Once | INTRAVENOUS | Status: AC | PRN
  Administered 2022-01-04: 75 mL via INTRAVENOUS

## 2022-01-06 ENCOUNTER — Other Ambulatory Visit: Payer: Self-pay | Admitting: Internal Medicine

## 2022-01-06 DIAGNOSIS — R079 Chest pain, unspecified: Secondary | ICD-10-CM

## 2022-01-06 DIAGNOSIS — T1490XA Injury, unspecified, initial encounter: Secondary | ICD-10-CM

## 2022-01-06 DIAGNOSIS — W19XXXA Unspecified fall, initial encounter: Secondary | ICD-10-CM

## 2022-01-12 ENCOUNTER — Ambulatory Visit (INDEPENDENT_AMBULATORY_CARE_PROVIDER_SITE_OTHER): Payer: Medicare Other | Admitting: Nurse Practitioner

## 2022-01-12 ENCOUNTER — Encounter (INDEPENDENT_AMBULATORY_CARE_PROVIDER_SITE_OTHER): Payer: Medicare Other

## 2022-01-16 ENCOUNTER — Telehealth (INDEPENDENT_AMBULATORY_CARE_PROVIDER_SITE_OTHER): Payer: Self-pay

## 2022-01-16 ENCOUNTER — Other Ambulatory Visit (INDEPENDENT_AMBULATORY_CARE_PROVIDER_SITE_OTHER): Payer: Self-pay | Admitting: Nurse Practitioner

## 2022-01-17 ENCOUNTER — Other Ambulatory Visit (INDEPENDENT_AMBULATORY_CARE_PROVIDER_SITE_OTHER): Payer: Self-pay | Admitting: Nurse Practitioner

## 2022-01-17 MED ORDER — OXYCODONE-ACETAMINOPHEN 5-325 MG PO TABS: 1.0000 | ORAL_TABLET | ORAL | 0 refills | Status: DC | PRN

## 2022-01-17 NOTE — Telephone Encounter (Signed)
Spoke with pt she states that she missed her appt because she lives in [place] now, I explained that pain meds were sent in but not Percocet 10s and if she does not come in for a follow up appt that no more pain meds will sent in. Pt states understanding. ?

## 2022-01-17 NOTE — Telephone Encounter (Signed)
I wont be sending percocet 10s because she is far enough out from immediate surgery.  She also no showed her most recent visit. I'll send in some pain medication.  She should be scheduled for a follow up visit and if she misses that we wont refill it

## 2022-01-18 ENCOUNTER — Other Ambulatory Visit: Payer: Self-pay

## 2022-01-18 ENCOUNTER — Emergency Department
Admission: EM | Admit: 2022-01-18 | Discharge: 2022-01-18 | Disposition: A | Discharge status: Home / Self Care | Payer: Medicare Other | Attending: Emergency Medicine | Admitting: Emergency Medicine

## 2022-01-18 ENCOUNTER — Encounter: Payer: Self-pay | Admitting: Radiology

## 2022-01-18 ENCOUNTER — Emergency Department: Payer: Medicare Other

## 2022-01-18 DIAGNOSIS — I4891 Unspecified atrial fibrillation: Secondary | ICD-10-CM | POA: Insufficient documentation

## 2022-01-18 DIAGNOSIS — J441 Chronic obstructive pulmonary disease with (acute) exacerbation: Secondary | ICD-10-CM | POA: Insufficient documentation

## 2022-01-18 DIAGNOSIS — R0789 Other chest pain: Secondary | ICD-10-CM | POA: Diagnosis present

## 2022-01-18 DIAGNOSIS — I10 Essential (primary) hypertension: Secondary | ICD-10-CM | POA: Insufficient documentation

## 2022-01-18 DIAGNOSIS — E876 Hypokalemia: Secondary | ICD-10-CM | POA: Diagnosis not present

## 2022-01-18 DIAGNOSIS — Z20822 Contact with and (suspected) exposure to covid-19: Secondary | ICD-10-CM | POA: Diagnosis not present

## 2022-01-18 DIAGNOSIS — Z7901 Long term (current) use of anticoagulants: Secondary | ICD-10-CM | POA: Insufficient documentation

## 2022-01-18 LAB — RESP PANEL BY RT-PCR (FLU A&B, COVID) ARPGX2
Influenza A by PCR: NEGATIVE
Influenza B by PCR: NEGATIVE
SARS Coronavirus 2 by RT PCR: NEGATIVE

## 2022-01-18 LAB — CBC WITH DIFFERENTIAL/PLATELET
Abs Immature Granulocytes: 0.02 10*3/uL (ref 0.00–0.07)
Basophils Absolute: 0.1 10*3/uL (ref 0.0–0.1)
Basophils Relative: 1 %
Eosinophils Absolute: 0.1 10*3/uL (ref 0.0–0.5)
Eosinophils Relative: 1 %
HCT: 37.5 % (ref 36.0–46.0)
Hemoglobin: 11.6 g/dL — ABNORMAL LOW (ref 12.0–15.0)
Immature Granulocytes: 0 %
Lymphocytes Relative: 35 %
Lymphs Abs: 2.5 10*3/uL (ref 0.7–4.0)
MCH: 27.2 pg (ref 26.0–34.0)
MCHC: 30.9 g/dL (ref 30.0–36.0)
MCV: 88 fL (ref 80.0–100.0)
Monocytes Absolute: 0.5 10*3/uL (ref 0.1–1.0)
Monocytes Relative: 7 %
Neutro Abs: 4.1 10*3/uL (ref 1.7–7.7)
Neutrophils Relative %: 56 %
Platelets: 375 10*3/uL (ref 150–400)
RBC: 4.26 MIL/uL (ref 3.87–5.11)
RDW: 15.2 % (ref 11.5–15.5)
WBC: 7.3 10*3/uL (ref 4.0–10.5)
nRBC: 0 % (ref 0.0–0.2)

## 2022-01-18 LAB — COMPREHENSIVE METABOLIC PANEL
ALT: 17 U/L (ref 0–44)
AST: 19 U/L (ref 15–41)
Albumin: 3.7 g/dL (ref 3.5–5.0)
Alkaline Phosphatase: 93 U/L (ref 38–126)
Anion gap: 9 (ref 5–15)
BUN: 13 mg/dL (ref 8–23)
CO2: 23 mmol/L (ref 22–32)
Calcium: 8.8 mg/dL — ABNORMAL LOW (ref 8.9–10.3)
Chloride: 104 mmol/L (ref 98–111)
Creatinine, Ser: 1.01 mg/dL — ABNORMAL HIGH (ref 0.44–1.00)
GFR, Estimated: >60 mL/min (ref >60)
Glucose, Bld: 103 mg/dL — ABNORMAL HIGH (ref 70–99)
Potassium: 3.2 mmol/L — ABNORMAL LOW (ref 3.5–5.1)
Sodium: 136 mmol/L (ref 135–145)
Total Bilirubin: 0.7 mg/dL (ref 0.3–1.2)
Total Protein: 7.5 g/dL (ref 6.5–8.1)

## 2022-01-18 LAB — TROPONIN I (HIGH SENSITIVITY)
Troponin I (High Sensitivity): 5 ng/L (ref <18)
Troponin I (High Sensitivity): 5 ng/L (ref <18)

## 2022-01-18 LAB — BRAIN NATRIURETIC PEPTIDE: B Natriuretic Peptide: 29 pg/mL (ref 0.0–100.0)

## 2022-01-18 MED ORDER — PREDNISONE 50 MG PO TABS: 50.0000 mg | ORAL_TABLET | Freq: Every day | ORAL | 0 refills | Status: AC

## 2022-01-18 MED ORDER — POTASSIUM CHLORIDE CRYS ER 20 MEQ PO TBCR
40.0000 meq | EXTENDED_RELEASE_TABLET | Freq: Once | ORAL | Status: AC
  Administered 2022-01-18: 40 meq via ORAL
  Filled 2022-01-18: qty 2

## 2022-01-18 MED ORDER — IPRATROPIUM-ALBUTEROL 0.5-2.5 (3) MG/3ML IN SOLN
3.0000 mL | Freq: Once | RESPIRATORY_TRACT | Status: AC
  Administered 2022-01-18: 3 mL via RESPIRATORY_TRACT
  Filled 2022-01-18: qty 3

## 2022-01-18 MED ORDER — METHYLPREDNISOLONE SODIUM SUCC 125 MG IJ SOLR
125.0000 mg | Freq: Once | INTRAMUSCULAR | Status: AC
  Administered 2022-01-18: 125 mg via INTRAVENOUS
  Filled 2022-01-18: qty 2

## 2022-01-18 NOTE — ED Provider Notes (Signed)
? ?[facility] ?Provider Note ? ? ? Event Date/Time  ? First MD Initiated Contact with Patient 01/18/22 1633   ?  (approximate) ? ? ?History  ? ?Chest Pain ? ? ?HPI ? ?Carla [name] is a 61 y.o. female who presents to the ED for evaluation of Chest Pain ?  ?I reviewed telemedicine cardiology visit from this morning.  No documented note yet.  3/13 outpatient visit reviewed.  Anticoagulated on Eliquis, HTN, HLD, A-fib. ? ?Patient presents to the ED for evaluation of palpitations, dyspnea and chest discomfort.  She reports symptoms starting just 30 minutes prior to calling 911.  She reports developing a weird sensation in her chest, palpitations and symptomatic atrial fibrillation. ? ?She was provided ASA 324 in the field, nitroglycerin with minimal improvement of her symptoms. ? ?Here in the ED, she reports continued shortness of breath and nonproductive cough over the past few hours.  Chest pain improved. ? ? ?Physical Exam  ? ?Triage Vital Signs: ?ED Triage Vitals  ?Enc Vitals Group  ?   BP 01/18/22 1638 94/70  ?   Pulse Rate 01/18/22 1638 80  ?   Resp --   ?   Temp 01/18/22 1638 98.5 ?F (36.9 ?C)  ?   Temp Source 01/18/22 1638 Oral  ?   SpO2 01/18/22 1633 99 %  ?   Weight 01/18/22 1634 232 lb (105.2 kg)  ?   Height 01/18/22 1634 '5\' 2"'$  (1.575 m)  ?   Head Circumference --   ?   Peak Flow --   ?   Pain Score --   ?   Pain Loc --   ?   Pain Edu? --   ?   Excl. in [place]? --   ? ? ?Most recent vital signs: ?Vitals:  ? 01/18/22 1633 01/18/22 1638  ?BP:  94/70  ?Pulse:  80  ?Temp:  98.5 ?F (36.9 ?C)  ?SpO2: 99% 95%  ? ? ?General: Awake, no distress.  Conversational on room air ?CV:  Good peripheral perfusion.  Irregular without tachycardia ?Resp:  Normal effort.  Mild tachypnea to the low 20s.  Poor airflow throughout and expiratory wheezing throughout. ?Abd:  No distention.  ?MSK:  No deformity noted.  ?Neuro:  No focal deficits appreciated. ?Other:   ? ? ?ED Results / Procedures / Treatments   ? ?Labs ?(all labs ordered are listed, but only abnormal results are displayed) ?Labs Reviewed  ?COMPREHENSIVE METABOLIC PANEL - Abnormal; Notable for the following components:  ?    Result Value  ? Potassium 3.2 (*)   ? Glucose, Bld 103 (*)   ? Creatinine, Ser 1.01 (*)   ? Calcium 8.8 (*)   ? All other components within normal limits  ?CBC WITH DIFFERENTIAL/PLATELET - Abnormal; Notable for the following components:  ? Hemoglobin 11.6 (*)   ? All other components within normal limits  ?RESP PANEL BY RT-PCR (FLU A&B, COVID) ARPGX2  ?BRAIN NATRIURETIC PEPTIDE  ?TROPONIN I (HIGH SENSITIVITY)  ?TROPONIN I (HIGH SENSITIVITY)  ? ? ?EKG ?Sinus rhythm, rate of 76 bpm.  Normal axis and intervals.  No evidence of acute ischemia. ? ?RADIOLOGY ?CXR reviewed by me without evidence of acute cardiopulmonary pathology. ? ?Official radiology report(s): ?DG Chest Portable 1 View ? ?Result Date: 01/18/2022 ?CLINICAL DATA:  CHF/COPD.  Chest pain EXAM: PORTABLE CHEST 1 VIEW COMPARISON:  02/02/2019 FINDINGS: The heart size and mediastinal contours are within normal limits. Both lungs are clear. The visualized skeletal  structures are unremarkable. IMPRESSION: No active disease. Electronically Signed   By: Franchot Gallo M.D.   On: 01/18/2022 17:05   ? ?PROCEDURES and INTERVENTIONS: ? ?.1-3 Lead EKG Interpretation ?Performed by: Vladimir Crofts, MD ?Authorized by: Vladimir Crofts, MD  ? ?  Interpretation: normal   ?  ECG rate:  78 ?  ECG rate assessment: normal   ?  Rhythm: sinus rhythm   ?  Ectopy: none   ?  Conduction: normal   ? ?Medications  ?ipratropium-albuterol (DUONEB) 0.5-2.5 (3) MG/3ML nebulizer solution 3 mL (3 mLs Nebulization Given 01/18/22 1655)  ?methylPREDNISolone sodium succinate (SOLU-MEDROL) 125 mg/2 mL injection 125 mg (125 mg Intravenous Given 01/18/22 1655)  ?potassium chloride SA (KLOR-CON M) CR tablet 40 mEq (40 mEq Oral Given 01/18/22 1901)  ? ? ? ?IMPRESSION / MDM / ASSESSMENT AND PLAN / ED COURSE  ?I reviewed the triage  vital signs and the nursing notes. ? ?61yo female presents to the ED with CP,SOB and palpitations, with evidence of a COPD exacerbation ultimately suitable for trial of outpatient management.  She has reassuring vital signs on room air.  Exam does demonstrate stigmata of COPD with wheezing and decreased air movement throughout.  No distress or signs of gross volume overload.  Her CXR is clear and blood work only shows mild hypokalemia.  Normal CBC and negative BNP.  2 negative high-sensitivity troponins.  Her symptoms resolve after breathing treatment and initiation of steroids.  Mild hypokalemia is repleted orally.  I certainly considered medical observation admission for this patient considering her medical risk factors, but she declines and indicates that she would rather go home.  I think it is reasonable to attempt this considering her reassuring status after treatment.  We discussed close return precautions and she was discharged with steroids. ? ?Clinical Course as of 01/18/22 2003  ?Wed Jan 18, 2022  ?1804 Reassessed.  Feeling better.  She is requesting to go home.  We discussed a second troponin at the very least before disposition.  She is agreeable. [DS]  ?  ?Clinical Course User Index ?[DS] Vladimir Crofts, MD  ? ? ? ?FINAL CLINICAL IMPRESSION(S) / ED DIAGNOSES  ? ?Final diagnoses:  ?Other chest pain  ?COPD exacerbation ([place])  ? ? ? ?Rx / DC Orders  ? ?ED Discharge Orders   ? ?      Ordered  ?  predniSONE (DELTASONE) 50 MG tablet  Daily       ? 01/18/22 2001  ? ?  ?  ? ?  ? ? ? ?Note:  This document was prepared using Dragon voice recognition software and may include unintentional dictation errors. ?  ?Vladimir Crofts, MD ?01/18/22 2011 ? ?

## 2022-01-18 NOTE — ED Triage Notes (Signed)
Pt called out for mid sternal chest pain that started today.Pt took a nitro that brought pain from 8 down to a 2. ?

## 2022-01-23 ENCOUNTER — Telehealth (INDEPENDENT_AMBULATORY_CARE_PROVIDER_SITE_OTHER): Payer: Self-pay

## 2022-01-24 ENCOUNTER — Other Ambulatory Visit (INDEPENDENT_AMBULATORY_CARE_PROVIDER_SITE_OTHER): Payer: Self-pay | Admitting: Nurse Practitioner

## 2022-01-24 MED ORDER — OXYCODONE-ACETAMINOPHEN 5-325 MG PO TABS: 1.0000 | ORAL_TABLET | Freq: Four times a day (QID) | ORAL | 0 refills | Status: DC | PRN

## 2022-02-02 ENCOUNTER — Telehealth (INDEPENDENT_AMBULATORY_CARE_PROVIDER_SITE_OTHER): Payer: Self-pay | Admitting: Vascular Surgery

## 2022-02-02 NOTE — Telephone Encounter (Signed)
Patient called and stated she was in pain and needed a refill on Lyrica.  Please advise. ?

## 2022-02-03 ENCOUNTER — Other Ambulatory Visit: Payer: Self-pay

## 2022-02-03 ENCOUNTER — Emergency Department: Payer: Medicare Other

## 2022-02-03 ENCOUNTER — Observation Stay
Admission: EM | Admit: 2022-02-03 | Discharge: 2022-02-04 | Disposition: A | Discharge status: Home / Self Care | Payer: Medicare Other | Attending: Hospitalist | Admitting: Hospitalist

## 2022-02-03 DIAGNOSIS — Z96651 Presence of right artificial knee joint: Secondary | ICD-10-CM | POA: Insufficient documentation

## 2022-02-03 DIAGNOSIS — R0789 Other chest pain: Principal | ICD-10-CM | POA: Insufficient documentation

## 2022-02-03 DIAGNOSIS — Z85828 Personal history of other malignant neoplasm of skin: Secondary | ICD-10-CM | POA: Insufficient documentation

## 2022-02-03 DIAGNOSIS — Z79899 Other long term (current) drug therapy: Secondary | ICD-10-CM | POA: Insufficient documentation

## 2022-02-03 DIAGNOSIS — E876 Hypokalemia: Secondary | ICD-10-CM

## 2022-02-03 DIAGNOSIS — J449 Chronic obstructive pulmonary disease, unspecified: Secondary | ICD-10-CM | POA: Diagnosis present

## 2022-02-03 DIAGNOSIS — Z7982 Long term (current) use of aspirin: Secondary | ICD-10-CM | POA: Insufficient documentation

## 2022-02-03 DIAGNOSIS — G8929 Other chronic pain: Secondary | ICD-10-CM

## 2022-02-03 DIAGNOSIS — E66813 Obesity, class 3: Secondary | ICD-10-CM

## 2022-02-03 DIAGNOSIS — I5032 Chronic diastolic (congestive) heart failure: Secondary | ICD-10-CM | POA: Diagnosis not present

## 2022-02-03 DIAGNOSIS — Z7901 Long term (current) use of anticoagulants: Secondary | ICD-10-CM

## 2022-02-03 DIAGNOSIS — R002 Palpitations: Secondary | ICD-10-CM

## 2022-02-03 DIAGNOSIS — I480 Paroxysmal atrial fibrillation: Secondary | ICD-10-CM | POA: Diagnosis present

## 2022-02-03 DIAGNOSIS — N179 Acute kidney failure, unspecified: Secondary | ICD-10-CM | POA: Diagnosis present

## 2022-02-03 DIAGNOSIS — I110 Hypertensive heart disease with heart failure: Secondary | ICD-10-CM | POA: Insufficient documentation

## 2022-02-03 DIAGNOSIS — R072 Precordial pain: Secondary | ICD-10-CM | POA: Diagnosis not present

## 2022-02-03 DIAGNOSIS — F17210 Nicotine dependence, cigarettes, uncomplicated: Secondary | ICD-10-CM | POA: Insufficient documentation

## 2022-02-03 DIAGNOSIS — Z8616 Personal history of COVID-19: Secondary | ICD-10-CM | POA: Insufficient documentation

## 2022-02-03 DIAGNOSIS — I2510 Atherosclerotic heart disease of native coronary artery without angina pectoris: Secondary | ICD-10-CM | POA: Diagnosis not present

## 2022-02-03 DIAGNOSIS — R079 Chest pain, unspecified: Secondary | ICD-10-CM | POA: Diagnosis present

## 2022-02-03 DIAGNOSIS — F419 Anxiety disorder, unspecified: Secondary | ICD-10-CM

## 2022-02-03 DIAGNOSIS — R7303 Prediabetes: Secondary | ICD-10-CM | POA: Insufficient documentation

## 2022-02-03 DIAGNOSIS — Z20822 Contact with and (suspected) exposure to covid-19: Secondary | ICD-10-CM | POA: Diagnosis not present

## 2022-02-03 DIAGNOSIS — F32A Depression, unspecified: Secondary | ICD-10-CM

## 2022-02-03 LAB — BASIC METABOLIC PANEL
Anion gap: 14 (ref 5–15)
BUN: 20 mg/dL (ref 8–23)
CO2: 26 mmol/L (ref 22–32)
Calcium: 9.2 mg/dL (ref 8.9–10.3)
Chloride: 98 mmol/L (ref 98–111)
Creatinine, Ser: 1.27 mg/dL — ABNORMAL HIGH (ref 0.44–1.00)
GFR, Estimated: 48 mL/min — ABNORMAL LOW (ref >60)
Glucose, Bld: 129 mg/dL — ABNORMAL HIGH (ref 70–99)
Potassium: 3.1 mmol/L — ABNORMAL LOW (ref 3.5–5.1)
Sodium: 138 mmol/L (ref 135–145)

## 2022-02-03 LAB — RESP PANEL BY RT-PCR (FLU A&B, COVID) ARPGX2
Influenza A by PCR: NEGATIVE
Influenza B by PCR: NEGATIVE
SARS Coronavirus 2 by RT PCR: NEGATIVE

## 2022-02-03 LAB — CBC
HCT: 42 % (ref 36.0–46.0)
Hemoglobin: 12.9 g/dL (ref 12.0–15.0)
MCH: 26.4 pg (ref 26.0–34.0)
MCHC: 30.7 g/dL (ref 30.0–36.0)
MCV: 86.1 fL (ref 80.0–100.0)
Platelets: 289 10*3/uL (ref 150–400)
RBC: 4.88 MIL/uL (ref 3.87–5.11)
RDW: 15.7 % — ABNORMAL HIGH (ref 11.5–15.5)
WBC: 10.3 10*3/uL (ref 4.0–10.5)
nRBC: 0 % (ref 0.0–0.2)

## 2022-02-03 LAB — TROPONIN I (HIGH SENSITIVITY)
Troponin I (High Sensitivity): 8 ng/L (ref <18)
Troponin I (High Sensitivity): 8 ng/L (ref <18)

## 2022-02-03 MED ORDER — ONDANSETRON HCL 4 MG/2ML IJ SOLN: 4.0000 mg | Freq: Four times a day (QID) | INTRAMUSCULAR | Status: DC | PRN

## 2022-02-03 MED ORDER — METHYLPREDNISOLONE SODIUM SUCC 125 MG IJ SOLR
125.0000 mg | Freq: Once | INTRAMUSCULAR | Status: AC
  Administered 2022-02-03: 125 mg via INTRAVENOUS
  Filled 2022-02-03: qty 2

## 2022-02-03 MED ORDER — ACETAMINOPHEN 325 MG PO TABS
650.0000 mg | ORAL_TABLET | ORAL | Status: DC | PRN
  Administered 2022-02-04 (×2): 650 mg via ORAL
  Filled 2022-02-03 (×2): qty 2

## 2022-02-03 MED ORDER — POTASSIUM CHLORIDE CRYS ER 20 MEQ PO TBCR
40.0000 meq | EXTENDED_RELEASE_TABLET | Freq: Once | ORAL | Status: AC
  Administered 2022-02-03: 40 meq via ORAL
  Filled 2022-02-03: qty 2

## 2022-02-03 MED ORDER — OXYCODONE HCL 5 MG PO TABS
5.0000 mg | ORAL_TABLET | Freq: Once | ORAL | Status: AC
  Administered 2022-02-03: 5 mg via ORAL
  Filled 2022-02-03: qty 1

## 2022-02-03 MED ORDER — IPRATROPIUM-ALBUTEROL 0.5-2.5 (3) MG/3ML IN SOLN: 3.0000 mL | Freq: Every day | RESPIRATORY_TRACT | Status: DC | PRN

## 2022-02-03 MED ORDER — ATORVASTATIN CALCIUM 20 MG PO TABS
80.0000 mg | ORAL_TABLET | Freq: Every day | ORAL | Status: DC
  Administered 2022-02-04: 80 mg via ORAL
  Filled 2022-02-03: qty 4

## 2022-02-03 MED ORDER — SODIUM CHLORIDE 0.9 % IV BOLUS
1000.0000 mL | Freq: Once | INTRAVENOUS | Status: DC
Start: 2022-02-03 — End: 2022-02-03

## 2022-02-03 MED ORDER — ISOSORBIDE MONONITRATE ER 30 MG PO TB24
60.0000 mg | ORAL_TABLET | Freq: Every day | ORAL | Status: DC
  Administered 2022-02-04: 60 mg via ORAL
  Filled 2022-02-03: qty 2

## 2022-02-03 MED ORDER — POTASSIUM CHLORIDE CRYS ER 20 MEQ PO TBCR
40.0000 meq | EXTENDED_RELEASE_TABLET | Freq: Once | ORAL | Status: AC
  Administered 2022-02-04: 40 meq via ORAL
  Filled 2022-02-03: qty 2

## 2022-02-03 MED ORDER — ASPIRIN EC 81 MG PO TBEC
81.0000 mg | DELAYED_RELEASE_TABLET | Freq: Every day | ORAL | Status: DC
  Administered 2022-02-04: 81 mg via ORAL
  Filled 2022-02-03: qty 1

## 2022-02-03 MED ORDER — ALPRAZOLAM 0.5 MG PO TABS
0.5000 mg | ORAL_TABLET | Freq: Two times a day (BID) | ORAL | Status: DC | PRN
  Administered 2022-02-04: 0.5 mg via ORAL
  Filled 2022-02-03: qty 1

## 2022-02-03 MED ORDER — OXYCODONE-ACETAMINOPHEN 5-325 MG PO TABS
1.0000 | ORAL_TABLET | Freq: Four times a day (QID) | ORAL | Status: DC | PRN
  Administered 2022-02-04: 1 via ORAL
  Filled 2022-02-03: qty 1

## 2022-02-03 MED ORDER — IPRATROPIUM-ALBUTEROL 0.5-2.5 (3) MG/3ML IN SOLN
3.0000 mL | Freq: Once | RESPIRATORY_TRACT | Status: AC
  Administered 2022-02-03: 3 mL via RESPIRATORY_TRACT
  Filled 2022-02-03: qty 3

## 2022-02-03 MED ORDER — METOPROLOL TARTRATE 50 MG PO TABS
50.0000 mg | ORAL_TABLET | Freq: Two times a day (BID) | ORAL | Status: DC
  Administered 2022-02-04: 50 mg via ORAL
  Filled 2022-02-03 (×2): qty 1

## 2022-02-03 MED ORDER — NITROGLYCERIN 0.4 MG SL SUBL
0.4000 mg | SUBLINGUAL_TABLET | SUBLINGUAL | Status: DC | PRN
  Administered 2022-02-04 (×2): 0.4 mg via SUBLINGUAL
  Filled 2022-02-03 (×2): qty 1

## 2022-02-03 MED ORDER — APIXABAN 5 MG PO TABS
5.0000 mg | ORAL_TABLET | Freq: Two times a day (BID) | ORAL | Status: DC
  Administered 2022-02-04: 5 mg via ORAL
  Filled 2022-02-03: qty 1

## 2022-02-03 MED ORDER — SODIUM CHLORIDE 0.9 % IV BOLUS
1000.0000 mL | Freq: Once | INTRAVENOUS | Status: AC
  Administered 2022-02-04: 1000 mL via INTRAVENOUS

## 2022-02-03 MED ORDER — NITROGLYCERIN 0.4 MG SL SUBL: 0.4000 mg | SUBLINGUAL_TABLET | SUBLINGUAL | Status: DC

## 2022-02-03 NOTE — Telephone Encounter (Signed)
Patient was made aware with medical advice ?

## 2022-02-03 NOTE — Assessment & Plan Note (Signed)
-   Continue Eliquis 

## 2022-02-03 NOTE — Assessment & Plan Note (Signed)
IV hydration and monitor renal function ?

## 2022-02-03 NOTE — Assessment & Plan Note (Signed)
Replete orally and monitor.  Will check magnesium ?

## 2022-02-03 NOTE — H&P (Signed)
?History and Physical  ? ? ?Patient: Carla [name] DOB: 01-06-1961 ?DOA: 02/03/2022 ?DOS: the patient was seen and examined on 02/03/2022 ?PCP: Pcp, No  ?Patient coming from: Home ? ?Chief Complaint:  ?Chief Complaint  ?Patient presents with  ? Palpitations  ? ? ?HPI: Carla [name] is a 61 y.o. female with medical history significant for Diastolic CHF, CAD, paroxysmal A-fib on Eliquis, PVD, COPD, anxiety and depression, chronic pain and class III obesity, who was referred to the ED for admission by her cardiology office for evaluation of shortness of breath and fluttering in her chest.  Outpatient visits including phone calls to her cardiologist were reviewed.  Patient was seen in 3/15 for follow-up of right-sided chest pain that had been going on for few weeks.  She had a CT chest on 3/15 that showed small pleural effusion and she was also seen in the ED on 3/29 with a negative ACS work-up.  On the day of arrival she called her cardiologist with a complaint of feeling fluttering in her chest and shortness of breath and she was directed to the ED.  Patient is reporting right-sided chest pain and dizziness with exertion.  She is denying nausea, vomiting or lightheadedness.  She has no cough, fever or chills. ?ED course: On arrival tachypneic at 22 with BP 148/103 and otherwise normal vitals.  Blood work significant for normal WBC and hemoglobin.  Potassium of 3.1, creatinine 1.27 above baseline of 0.77 on 2/8.  Troponin of 8.  COVID and flu negative.  EKG, personally viewed and interpreted showing normal sinus rhythm at 98 with no acute ST-T wave changes.  Chest x-ray with no acute abnormalities. ?Patient was given an oxycodone and also treated with DuoNebs and Solu-Medrol.  Hospitalist consulted for admission.  ? ?Review of Systems: As mentioned in the history of present illness. All other systems reviewed and are negative. ?Past Medical History:  ?Diagnosis Date  ? Abdominal aortic atherosclerosis ([place])    ? a. 05/2017 CTA abd/pelvis: significant atherosclerotic dzs of the infrarenal abd Ao w/ some mural thrombus. No aneurysm or dissection.  ? Acute focal ischemia of small intestine (HCC)   ? Acute right-sided low back pain with right-sided sciatica 06/13/2017  ? AKI (acute kidney injury) ([place]) 07/29/2017  ? Anginal pain ([place])   ? a. 08/2012 Lexiscan MV: EF 54%, non ischemia/infarct.  ? Anxiety and depression   ? Baker's cyst of knee, right   ? a. 07/2016 U/S: 4.1 x 1.4 x 2.9 cystic structure in R poplitetal fossa.  ? Bell's palsy   ? Borderline diabetes   ? CHF (congestive heart failure) ([place])   ? Chronic anticoagulation   ? apixaban  ? Chronic idiopathic constipation   ? Chronic mesenteric ischemia ([place])   ? Chronic pain   ? COPD (chronic obstructive pulmonary disease) ([place])   ? Diastolic dysfunction   ? a. 05/2017 Echo: Ef 60-65%, no rwma, Gr1 DD, no source of cardiac emboli.  ? Dyspnea   ? 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 a. 05/2017 CTA Abd/pelvis: apparent thrombus or embolus in prox SMA (70-90%); b. 05/2017 catheter directed tPA, mechanical thrombectomy, and stenting of the SMA.  ? Essential hypertension with goal blood pressure less than 140/90 01/19/2016  ? Gastroesophageal reflux disease 01/19/2016  ? GERD (gastroesophageal reflux disease)   ? H/O colectomy   ? History of 2019 novel coronavirus disease (COVID-19) 11/14/2019  ? History of kidney stones   ? Hyperlipidemia   ?  Hypertension   ? Morbid obesity with BMI of 40.0-44.9, adult ([place])   ? Occlusive mesenteric ischemia ([place]) 06/19/2017  ? Osteoarthritis   ? PAD (peripheral artery disease) ([place])   ? PAF (paroxysmal atrial fibrillation) ([place])   ? Palpitations   ? Peripheral edema   ? Personal history of other malignant neoplasm of skin 06/01/2016  ? Pneumonia   ? Pre-diabetes   ? Primary osteoarthritis of both knees 06/13/2017  ? SBO (small bowel obstruction) ([place]) 08/06/2017  ? Skin cancer of nose   ? 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 Vitamin D deficiency   ? ?Past Surgical History:  ?Procedure Laterality Date  ? AMPUTATION TOE Left 12/09/2019  ? Procedure: AMPUTATION TOE MPJ left;  Surgeon: Caroline More, DPM;  Location: [facility];  Service: Podiatry;  Laterality: Left;  ? APPENDECTOMY    ? APPLICATION OF WOUND VAC  12/01/2021  ? Procedure: APPLICATION OF WOUND VAC;  Surgeon: Algernon Huxley, MD;  Location: [facility];  Service: Vascular;;  ? CHOLECYSTECTOMY    ? COLON SURGERY    ? EMBOLECTOMY OR THROMBECTOMY, WITH OR WITHOUT CATHETER; FEMOROPOPLITEAL, AORTOILIAC ARTERY, BY LEG INCISION N/A 06/21/2020  ? Left - Decomp Fasciotomy Leg; Ant &/Or Lat Compart Only; Midline - Angiography, Visceral, Selective Or Supraselective (With Or Without Flush Aortogram); Left - Revascularize, Endovasc, Open/Percut, Iliac Artery, Unilat, Initial Vessel; W/Translum Stent, W/Angioplasty; Right - Revascularize, Endovasc, Open/Percut, Iliac Artery, Ea Add`L Ipsilateral; W/Translumin Stent, W/Angioplasty; Location [facility];  ? ENDARTERECTOMY FEMORAL Left 12/01/2021  ? Procedure: ENDARTERECTOMY FEMORAL;  Surgeon: Algernon Huxley, MD;  Location: [facility];  Service: Vascular;  Laterality: Left;  ? ENDOVASCULAR REPAIR/STENT GRAFT Left 12/01/2021  ? Procedure: ENDOVASCULAR REPAIR/STENT GRAFT;  Surgeon: Algernon Huxley, MD;  Location: [place] [facility];  Service: Cardiovascular;  Laterality: Left;  ? LAPAROTOMY N/A 08/08/2017  ? Procedure: EXPLORATORY LAPAROTOMY POSSIBLE BOWEL RESECTION;  Surgeon: Jules Husbands, MD;  Location: [facility];  Service: General;  Laterality: N/A;  ? LEFT HEART CATH AND CORONARY ANGIOGRAPHY N/A 05/17/2021  ? Procedure: LEFT HEART CATH AND CORONARY ANGIOGRAPHY;  Surgeon: Yolonda Kida, MD;  Location: [place] [facility];  Service: Cardiovascular;  Laterality: N/A;  ? LOWER EXTREMITY ANGIOGRAPHY Left 11/17/2019  ? Procedure: LOWER EXTREMITY ANGIOGRAPHY;  Surgeon: Algernon Huxley, MD;  Location: [place] [facility];  Service: Cardiovascular;  Laterality: Left;   ? LOWER EXTREMITY ANGIOGRAPHY Right 12/13/2020  ? Procedure: LOWER EXTREMITY ANGIOGRAPHY;  Surgeon: Algernon Huxley, MD;  Location: [place] [facility];  Service: Cardiovascular;  Laterality: Right;  ? LOWER EXTREMITY ANGIOGRAPHY Right 01/10/2021  ? Procedure: LOWER EXTREMITY ANGIOGRAPHY;  Surgeon: Algernon Huxley, MD;  Location: [place] [facility];  Service: Cardiovascular;  Laterality: Right;  ? LOWER EXTREMITY ANGIOGRAPHY Right 01/24/2021  ? Procedure: LOWER EXTREMITY ANGIOGRAPHY;  Surgeon: Algernon Huxley, MD;  Location: [place] [facility];  Service: Cardiovascular;  Laterality: Right;  ? LOWER EXTREMITY ANGIOGRAPHY Bilateral 06/28/2021  ? Procedure: Lower Extremity Angiography;  Surgeon: Algernon Huxley, MD;  Location: [place] [facility];  Service: Cardiovascular;  Laterality: Bilateral;  ? LOWER EXTREMITY ANGIOGRAPHY Right 11/28/2021  ? Procedure: LOWER EXTREMITY ANGIOGRAPHY;  Surgeon: Algernon Huxley, MD;  Location: [place] [facility];  Service: Cardiovascular;  Laterality: Right;  ? LOWER EXTREMITY ANGIOGRAPHY Left 11/30/2021  ? Procedure: Lower Extremity Angiography;  Surgeon: Algernon Huxley, MD;  Location: [place] [facility];  Service: Cardiovascular;  Laterality: Left;  ?  LOWER EXTREMITY INTERVENTION N/A 11/19/2019  ? Procedure: LOWER EXTREMITY INTERVENTION;  Surgeon: Algernon Huxley, MD;  Location: [place] [facility];  Service: Cardiovascular;  Laterality: N/A;  ? LOWER EXTREMITY INTERVENTION Bilateral 06/29/2021  ? Procedure: LOWER EXTREMITY INTERVENTION;  Surgeon: Algernon Huxley, MD;  Location: [place] [facility];  Service: Cardiovascular;  Laterality: Bilateral;  ? TEE WITHOUT CARDIOVERSION N/A 06/22/2017  ? Procedure: TRANSESOPHAGEAL ECHOCARDIOGRAM (TEE);  Surgeon: Wellington Hampshire, MD;  Location: [facility];  Service: Cardiovascular;  Laterality: N/A;  ? TOTAL KNEE ARTHROPLASTY Right 07/11/2018  ? Procedure: TOTAL KNEE ARTHROPLASTY;  Surgeon: Corky Mull, MD;  Location: [facility];  Service: Orthopedics;   Laterality: Right;  ? VAGINAL HYSTERECTOMY    ? VISCERAL ARTERY INTERVENTION N/A 06/20/2017  ? Procedure: Visceral Artery Intervention, possible aortic thrombectomy;  Surgeon: Algernon Huxley, MD;  [facility]

## 2022-02-03 NOTE — ED Notes (Signed)
Pt placed on cardiac monitoring. Call light within reach. Pt has no further needs at this time.  ?

## 2022-02-03 NOTE — Telephone Encounter (Signed)
I discussed with Dr. Lucky Cowboy and she would need to discuss with her PCP as we dont provide chronic management for lyrica

## 2022-02-03 NOTE — ED Triage Notes (Signed)
Pt sent to ED by cardiologist for possible afib x3 days with shob and dizziness.  ?Dyspnea noted with exertion.  ?

## 2022-02-03 NOTE — Assessment & Plan Note (Signed)
Complicating factor to overall prognosis and care 

## 2022-02-03 NOTE — Assessment & Plan Note (Signed)
Continue aspirin, atorvastatin, Imdur, metoprolol with NTG SL as needed ?

## 2022-02-03 NOTE — Assessment & Plan Note (Signed)
Continue Eliquis and metoprolol.  Currently in sinus ?

## 2022-02-03 NOTE — ED Provider Notes (Signed)
? ?[facility] ?Provider Note ? ? ? Event Date/Time  ? First MD Initiated Contact with Patient 02/03/22 1953   ?  (approximate) ? ? ?History  ? ?Palpitations ? ? ?HPI ? ?Carla [name] is a 61 y.o. female who presents to the ED for evaluation of Palpitations ?  ?I reviewed cardiology visit from 4/3.  History of CAD, paroxysmal A-fib, HTN, HLD, PAD s/p iliac stenting.  Depression and tobacco abuse.  During his outpatient visit she was also complaining of right-sided intermittent sharp chest pain that is chronic. ? ?Patient presents to the ED for the evaluation of palpitations, chest pain and dyspnea on exertion.  She reports 3 days of the symptoms constantly. ? ?She quickly becomes quite tearful and breaks down crying and tells me about quite a few home psychosocial stressors.  She reports multiple deaths in the family recently, particularly her husband just this past December.  Reports that her family does not want her around and that she feels like she should just die.  Denies any active suicidal thoughts, but does definitely feel like she would be better off dead and that she is currently a burden on her family. ? ?Physical Exam  ? ?Triage Vital Signs: ?ED Triage Vitals  ?Enc Vitals Group  ?   BP 02/03/22 1708 (!) 148/103  ?   Pulse Rate 02/03/22 1709 98  ?   Resp 02/03/22 1707 (!) 22  ?   Temp 02/03/22 1707 98.2 ?F (36.8 ?C)  ?   Temp Source 02/03/22 1905 Oral  ?   SpO2 02/03/22 1709 98 %  ?   Weight 02/03/22 1905 225 lb (102.1 kg)  ?   Height 02/03/22 1708 '5\' 2"'$  (1.575 m)  ?   Head Circumference --   ?   Peak Flow --   ?   Pain Score 02/03/22 1707 0  ?   Pain Loc --   ?   Pain Edu? --   ?   Excl. in [place]? --   ? ? ?Most recent vital signs: ?Vitals:  ? 02/03/22 2200 02/03/22 2230  ?BP: (!) 156/73 (!) 166/74  ?Pulse: 94 79  ?Resp: 20 19  ?Temp:    ?SpO2: 99% 100%  ? ? ?General: Awake, no distress.  Obese.  Tearful.  Linear thought processes. ?CV:  Delayed capillary refill to bilateral lower  extremities.  Right leg is slower than the left and takes a good  4-5 seconds. ?Resp:  Normal effort.  Some diffuse expiratory wheezes without distress or decreased airflow. ?Abd:  No distention.  Soft and benign.  Large anterior hernia that is soft and reducible ?MSK:  No deformity noted.  ?Neuro:  No focal deficits appreciated. ?Other:   ? ? ?ED Results / Procedures / Treatments  ? ?Labs ?(all labs ordered are listed, but only abnormal results are displayed) ?Labs Reviewed  ?BASIC METABOLIC PANEL - Abnormal; Notable for the following components:  ?    Result Value  ? Potassium 3.1 (*)   ? Glucose, Bld 129 (*)   ? Creatinine, Ser 1.27 (*)   ? GFR, Estimated 48 (*)   ? All other components within normal limits  ?CBC - Abnormal; Notable for the following components:  ? RDW 15.7 (*)   ? All other components within normal limits  ?RESP PANEL BY RT-PCR (FLU A&B, COVID) ARPGX2  ?HIV ANTIBODY (ROUTINE TESTING W REFLEX)  ?BASIC METABOLIC PANEL  ?MAGNESIUM  ?TROPONIN I (HIGH SENSITIVITY)  ?TROPONIN I (  HIGH SENSITIVITY)  ? ? ?EKG ?Sinus rhythm, rate of 98 bpm.  Normal axis and intervals.  Nonspecific ST changes isolated to lead III without STEMI. ? ?RADIOLOGY ?CXR reviewed by me without evidence of acute cardiopulmonary pathology. ? ?Official radiology report(s): ?DG Chest 2 View ? ?Result Date: 02/03/2022 ?CLINICAL DATA:  Shortness of breath, dyspnea, possible atrial fibrillation, dizziness, history CHF EXAM: CHEST - 2 VIEW COMPARISON:  01/18/2022 FINDINGS: Normal heart size, mediastinal contours, and pulmonary vascularity. Lungs clear. No pulmonary infiltrate, pleural effusion, or pneumothorax. No acute osseous findings. IMPRESSION: No acute abnormalities. Electronically Signed   By: Lavonia Dana M.D.   On: 02/03/2022 17:27   ? ?PROCEDURES and INTERVENTIONS: ? ?.1-3 Lead EKG Interpretation ?Performed by: Vladimir Crofts, MD ?Authorized by: Vladimir Crofts, MD  ? ?  Interpretation: normal   ?  ECG rate:  94 ?  ECG rate assessment:  normal   ?  Rhythm: sinus rhythm   ?  Ectopy: none   ?  Conduction: normal   ? ?Medications  ?oxyCODONE-acetaminophen (PERCOCET/ROXICET) 5-325 MG per tablet 1 tablet (has no administration in time range)  ?aspirin EC tablet 81 mg (has no administration in time range)  ?atorvastatin (LIPITOR) tablet 80 mg (has no administration in time range)  ?isosorbide mononitrate (IMDUR) 24 hr tablet 15 mg (has no administration in time range)  ?metoprolol tartrate (LOPRESSOR) tablet 50 mg (has no administration in time range)  ?ALPRAZolam Duanne Moron) tablet 0.5 mg (has no administration in time range)  ?apixaban (ELIQUIS) tablet 5 mg (has no administration in time range)  ?Ipratropium-Albuterol (COMBIVENT) respimat 1 puff (has no administration in time range)  ?nitroGLYCERIN (NITROSTAT) SL tablet 0.4 mg (has no administration in time range)  ?acetaminophen (TYLENOL) tablet 650 mg (has no administration in time range)  ?ondansetron (ZOFRAN) injection 4 mg (has no administration in time range)  ?sodium chloride 0.9 % bolus 1,000 mL (has no administration in time range)  ?potassium chloride SA (KLOR-CON M) CR tablet 40 mEq (has no administration in time range)  ?ipratropium-albuterol (DUONEB) 0.5-2.5 (3) MG/3ML nebulizer solution 3 mL (3 mLs Nebulization Given 02/03/22 2044)  ?methylPREDNISolone sodium succinate (SOLU-MEDROL) 125 mg/2 mL injection 125 mg (125 mg Intravenous Given 02/03/22 2044)  ?oxyCODONE (Oxy IR/ROXICODONE) immediate release tablet 5 mg (5 mg Oral Given 02/03/22 2035)  ?potassium chloride SA (KLOR-CON M) CR tablet 40 mEq (40 mEq Oral Given 02/03/22 2034)  ? ? ? ?IMPRESSION / MDM / ASSESSMENT AND PLAN / ED COURSE  ?I reviewed the triage vital signs and the nursing notes. ? ?61 year old female presents to the ED for evaluation of chest pain, palpitations and grief.  She is somewhat hypertensive but otherwise has reassuring vitals on room air.  She is quite tearful on exam.  She has no evidence of psychiatric emergency to  require IVC, but certainly would like a psychiatric consult in the setting of her grief and depression with passive thoughts of her family better being off if she were dead.  Hypokalemia is noted and repleted orally.  Normal CBC and troponin.  No evidence of ACS.  Her CXR is clear.  Considering her history I believe an observation admission is warranted considering her few days of chest pain concerning for stable angina as well as her grief to allow for psychiatric consult. ? ?  ? ? ?FINAL CLINICAL IMPRESSION(S) / ED DIAGNOSES  ? ?Final diagnoses:  ?Other chest pain  ? ? ? ?Rx / DC Orders  ? ?ED Discharge Orders   ? ?  None  ? ?  ? ? ? ?Note:  This document was prepared using Dragon voice recognition software and may include unintentional dictation errors. ?  ?Vladimir Crofts, MD ?02/03/22 2350 ? ?

## 2022-02-03 NOTE — Assessment & Plan Note (Addendum)
Pain is mostly atypical, has been present for the past month, located on the right side and stabbing ?First troponin negative but will continue to trend ?Cardiology consult to determine further restratification ?Given atypical nature to pain, suspect noncardiac so we will treat with oxycodone to assess for relief ?Can consider CTA chest to evaluate for PE, though PE unlikely as patient is on apixaban ?CTA aorta on 3/15 showed: ?-:IMPRESSION: ?Trace right-sided pleural fluid, with Hounsfield units suggesting ?some complexity, potentially blood/hemothorax. However, there is no ?other evidence of associated trauma such as fracture, pulmonary ?contusion, pulmonary hemorrhage, etc. ?? ?Redemonstration of posterior right pulmonary nodule which has not ?increased in size from the comparison chest CT, however, has ?developed some marginal cystic change/cavitation. While the most ?likely entity would be post inflammatory/infectious changes, a ?follow-up CT is indicated, in 6-12 months. ? ?

## 2022-02-03 NOTE — Assessment & Plan Note (Signed)
Not acutely exacerbated DuoNebs as needed 

## 2022-02-03 NOTE — Assessment & Plan Note (Signed)
Continue oxycodone and Lyrica ?

## 2022-02-03 NOTE — ED Notes (Signed)
RUE 169/105 ?LUE 169/105 ?LLE 171/149 ?RLE 198/179 ?

## 2022-02-03 NOTE — Assessment & Plan Note (Signed)
Patient had sensation of heart fluttering but in NSR on arrival ?Continuous cardiac monitoring ?Continue metoprolol ?

## 2022-02-03 NOTE — Assessment & Plan Note (Signed)
Compensated ?Continue metoprolol and Imdur ?
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
## 2022-02-03 NOTE — Assessment & Plan Note (Signed)
Patient tearful during visit.  Grieving from loss of husband in December 2022 ?Continue Xanax as needed ?Might benefit from behavioral health consult during her stay ?

## 2022-02-04 ENCOUNTER — Other Ambulatory Visit: Payer: Self-pay

## 2022-02-04 DIAGNOSIS — R079 Chest pain, unspecified: Secondary | ICD-10-CM | POA: Diagnosis not present

## 2022-02-04 DIAGNOSIS — R072 Precordial pain: Secondary | ICD-10-CM | POA: Diagnosis not present

## 2022-02-04 DIAGNOSIS — F32A Depression, unspecified: Secondary | ICD-10-CM | POA: Diagnosis not present

## 2022-02-04 DIAGNOSIS — R0789 Other chest pain: Secondary | ICD-10-CM | POA: Diagnosis not present

## 2022-02-04 DIAGNOSIS — F419 Anxiety disorder, unspecified: Secondary | ICD-10-CM

## 2022-02-04 LAB — BASIC METABOLIC PANEL
Anion gap: 12 (ref 5–15)
BUN: 22 mg/dL (ref 8–23)
CO2: 21 mmol/L — ABNORMAL LOW (ref 22–32)
Calcium: 8.7 mg/dL — ABNORMAL LOW (ref 8.9–10.3)
Chloride: 104 mmol/L (ref 98–111)
Creatinine, Ser: 1.04 mg/dL — ABNORMAL HIGH (ref 0.44–1.00)
GFR, Estimated: >60 mL/min (ref >60)
Glucose, Bld: 189 mg/dL — ABNORMAL HIGH (ref 70–99)
Potassium: 3.5 mmol/L (ref 3.5–5.1)
Sodium: 137 mmol/L (ref 135–145)

## 2022-02-04 LAB — HIV ANTIBODY (ROUTINE TESTING W REFLEX): HIV Screen 4th Generation wRfx: NONREACTIVE

## 2022-02-04 LAB — MAGNESIUM: Magnesium: 2 mg/dL (ref 1.7–2.4)

## 2022-02-04 MED ORDER — PREGABALIN 100 MG PO CAPS: 100.0000 mg | ORAL_CAPSULE | Freq: Three times a day (TID) | ORAL | 2 refills | Status: DC

## 2022-02-04 MED ORDER — CITALOPRAM HYDROBROMIDE 40 MG PO TABS: 40.0000 mg | ORAL_TABLET | Freq: Every day | ORAL | 2 refills | Status: DC

## 2022-02-04 MED ORDER — PREGABALIN 50 MG PO CAPS
100.0000 mg | ORAL_CAPSULE | Freq: Three times a day (TID) | ORAL | Status: DC
  Administered 2022-02-04 (×2): 100 mg via ORAL
  Filled 2022-02-04 (×2): qty 2

## 2022-02-04 NOTE — Discharge Instructions (Signed)
[facility]  Mental health service in [place], [place]  Address: [address], [place], [place] [address] Hours:  Closed ? Opens 8?AM Mon Phone: [phone] 

## 2022-02-04 NOTE — Discharge Summary (Signed)
? ?Physician Discharge Summary ? ? ?Carla [name]  female DOB: Jan 22, 1961  ?MPN:[id number] ? ?PCP: Pcp, No ? ?Admit date: 02/03/2022 ?Discharge date: 02/04/2022 ? ?Admitted From: home ?Disposition:  home ?CODE STATUS: Full code ? ?Discharge Instructions   ? ? Discharge instructions   Complete by: As directed ?  ? I have refilled your Lyrica and Celexa.  Please establish with PCP to continue the Rx. ? ?Please establish with mental health care provider for your depression and grief from loss of your husband. ? ?Cardiologist has cleared you form heart issues. ? ? ?Dr. Enzo Bi ?- ?-  ? ?  ? ?Hospital Course:  ?For full details, please see H&P, progress notes, consult notes and ancillary notes.  ?Briefly,  ?Carla [name] is a 61 y.o. female with medical history significant for Diastolic CHF, CAD, paroxysmal A-fib on Eliquis, PVD, COPD, anxiety and depression, chronic pain and class III obesity, who was referred to the ED for admission by her cardiology office for evaluation of shortness of breath and fluttering in her chest.   ? ?She had been seen on 01/02/2022 after she sustained a fall at a funeral home and hit the right side of her chest.  Following this she had persistent right-sided chest pain with difficulty to take deep breaths.  She was sent for a CTA angiography of the chest which showed a trace right-sided pleural effusion but no other obvious signs of trauma.  She was then seen in the emergency department on 01/18/2022 with a negative ACS work-up.   ? ?Palpitations ?Patient had sensation of heart fluttering but in NSR on arrival and throughout tele monitoring. ?  ?Hx of non-obstructive CAD (coronary artery disease) ?Continue aspirin, atorvastatin ?  ?Atypical Chest pain, ACS ruled out ?Pain is mostly atypical, has been present for the past month, located on the right side and stabbing. ?--trop neg.   ?--cardiology consulted and determined no need for further cardiac workup. ?  ?AKI, ruled out ?--Cr 1.27 on  presentation.  Was 1.01 about 2 weeks ago. ?  ?Hypokalemia ?Repleted orally  ?  ?PAF (paroxysmal atrial fibrillation) ([place]) ?Chronic anticoagulation ?Continue Eliquis and metoprolol.   ?  ?Anxiety and depression ?Patient tearful during visit.  Grieving from loss of husband in December 2022 ?--psych consulted and determined pt did not meet inpatient psych admission criteria. ?--Pt was prescribed Celexa at discharge (what pt used to take in the past) and advised to establish with outpatient psych or counselor. ?--not currently taking Xanax. ?  ?Chronic diastolic CHF (congestive heart failure) ([place]) ?Compensated ?  ?Chronic pain ?No current Rx for oxycodone.   ?Refilled Lyrica at pt's request. ?  ?COPD (chronic obstructive pulmonary disease) ([place]) ?Active smoker ?Not acutely exacerbated ?  ?Morbid obesity with BMI of 40.0-44.9, adult ([place]) ?Complicating factor to overall prognosis and care ? ?Stopped meds were ones pt wasn't taking PTA. ? ? ?Discharge Diagnoses:  ?Principal Problem: ?  Chest pain ?Active Problems: ?  CAD (coronary artery disease) ?  Palpitations ?  AKI (acute kidney injury) ([place]) ?  Hypokalemia ?  PAF (paroxysmal atrial fibrillation) ([place]) ?  Chronic anticoagulation ?  Morbid obesity with BMI of 40.0-44.9, adult ([place]) ?  COPD (chronic obstructive pulmonary disease) ([place]) ?  Chronic pain ?  Chronic diastolic CHF (congestive heart failure) ([place]) ?  Anxiety and depression ? ? ? ? ?Discharge Instructions: ? ?Allergies as of 02/04/2022   ? ?   Reactions  ? Gabapentin   ? Tremors   ?  Bactrim [sulfamethoxazole-trimethoprim] Itching  ? ?  ? ?  ?Medication List  ?  ? ?STOP taking these medications   ? ?acetaminophen 325 MG tablet ?Commonly known as: TYLENOL ?  ?ALPRAZolam 0.5 MG tablet ?Commonly known as: Xanax ?  ?ciprofloxacin 500 MG tablet ?Commonly known as: CIPRO ?  ?cyclobenzaprine 5 MG tablet ?Commonly known as: FLEXERIL ?  ?oxyCODONE-acetaminophen 5-325 MG tablet ?Commonly known as: PERCOCET/ROXICET ?   ? ?  ? ?TAKE these medications   ? ?apixaban 5 MG Tabs tablet ?Commonly known as: ELIQUIS ?Take 1 tablet (5 mg total) by mouth 2 (two) times daily. ?  ?aspirin EC 81 MG tablet ?Take 81 mg by mouth daily. Swallow whole. ?  ?atorvastatin 80 MG tablet ?Commonly known as: LIPITOR ?Take 80 mg by mouth at bedtime. ?  ?citalopram 40 MG tablet ?Commonly known as: CeleXA ?Take 1 tablet (40 mg total) by mouth daily. ?  ?Combivent Respimat 20-100 MCG/ACT Aers respimat ?Generic drug: Ipratropium-Albuterol ?Inhale 1 puff into the lungs daily as needed for wheezing or shortness of breath. ?  ?ferrous sulfate 325 (65 FE) MG tablet ?Take 325 mg by mouth daily. ?  ?isosorbide mononitrate 60 MG 24 hr tablet ?Commonly known as: IMDUR ?Take 60 mg by mouth daily. ?  ?metoprolol tartrate 50 MG tablet ?Commonly known as: LOPRESSOR ?Take 1 tablet (50 mg total) by mouth 2 (two) times daily. ?  ?nitroGLYCERIN 0.4 MG SL tablet ?Commonly known as: NITROSTAT ?Place 0.4 mg under the tongue as directed. ?  ?omeprazole 20 MG capsule ?Commonly known as: PRILOSEC ?Take 20 mg by mouth 2 (two) times daily before a meal. ?  ?pregabalin 100 MG capsule ?Commonly known as: Lyrica ?Take 1 capsule (100 mg total) by mouth 3 (three) times daily. ?  ?traMADol 50 MG tablet ?Commonly known as: ULTRAM ?Take 50 mg by mouth every 8 (eight) hours as needed. ?  ? ?  ? ? ? ? ?Allergies  ?Allergen Reactions  ? Gabapentin   ?  Tremors   ? Bactrim [Sulfamethoxazole-Trimethoprim] Itching  ? ? ? ?The results of significant diagnostics from this hospitalization (including imaging, microbiology, ancillary and laboratory) are listed below for reference.  ? ?Consultations: ? ? ?Procedures/Studies: ?DG Chest 2 View ? ?Result Date: 02/03/2022 ?CLINICAL DATA:  Shortness of breath, dyspnea, possible atrial fibrillation, dizziness, history CHF EXAM: CHEST - 2 VIEW COMPARISON:  01/18/2022 FINDINGS: Normal heart size, mediastinal contours, and pulmonary vascularity. Lungs clear. No  pulmonary infiltrate, pleural effusion, or pneumothorax. No acute osseous findings. IMPRESSION: No acute abnormalities. Electronically Signed   By: Lavonia Dana M.D.   On: 02/03/2022 17:27  ? ?DG Chest Portable 1 View ? ?Result Date: 01/18/2022 ?CLINICAL DATA:  CHF/COPD.  Chest pain EXAM: PORTABLE CHEST 1 VIEW COMPARISON:  02/02/2019 FINDINGS: The heart size and mediastinal contours are within normal limits. Both lungs are clear. The visualized skeletal structures are unremarkable. IMPRESSION: No active disease. Electronically Signed   By: Franchot Gallo M.D.   On: 01/18/2022 17:05   ? ? ? ?Labs: ?BNP (last 3 results) ?Recent Labs  ?  04/20/21 ?1035 01/18/22 ?1646  ?BNP 45.4 29.0  ? ?Basic Metabolic Panel: ?Recent Labs  ?Lab 02/03/22 ?1719 02/04/22 ?0732  ?NA 138 137  ?K 3.1* 3.5  ?CL 98 104  ?CO2 26 21*  ?GLUCOSE 129* 189*  ?BUN 20 22  ?CREATININE 1.27* 1.04*  ?CALCIUM 9.2 8.7*  ?MG  --  2.0  ? ?Liver Function Tests: ?No results for input(s): AST, ALT, ALKPHOS,  BILITOT, PROT, ALBUMIN in the last 168 hours. ?No results for input(s): LIPASE, AMYLASE in the last 168 hours. ?No results for input(s): AMMONIA in the last 168 hours. ?CBC: ?Recent Labs  ?Lab 02/03/22 ?1719  ?WBC 10.3  ?HGB 12.9  ?HCT 42.0  ?MCV 86.1  ?PLT 289  ? ?Cardiac Enzymes: ?No results for input(s): CKTOTAL, CKMB, CKMBINDEX, TROPONINI in the last 168 hours. ?BNP: ?Invalid input(s): POCBNP ?CBG: ?No results for input(s): GLUCAP in the last 168 hours. ?D-Dimer ?No results for input(s): DDIMER in the last 72 hours. ?Hgb A1c ?No results for input(s): HGBA1C in the last 72 hours. ?Lipid Profile ?No results for input(s): CHOL, HDL, LDLCALC, TRIG, CHOLHDL, LDLDIRECT in the last 72 hours. ?Thyroid function studies ?No results for input(s): TSH, T4TOTAL, T3FREE, THYROIDAB in the last 72 hours. ? ?Invalid input(s): FREET3 ?Anemia work up ?No results for input(s): VITAMINB12, FOLATE, FERRITIN, TIBC, IRON, RETICCTPCT in the last 72 hours. ?Urinalysis ?    ?Component Value Date/Time  ? [place] (A) 07/11/2019 1451  ? APPEARANCEUR CLOUDY (A) 07/11/2019 1451  ? APPEARANCEUR Clear 01/19/2015 1112  ? LABSPEC 1.027 07/11/2019 1451  ? LABSPEC 1.019 01/19/2015 1112  ? PHU

## 2022-02-04 NOTE — Consult Note (Signed)
[facility] ? ?CARDIOLOGY CONSULT NOTE  ?Patient ID: ?Carla [name] ?MRN: [id number] ?DOB/AGE: October 23, 1961 61 y.o. ? ?Admit date: 02/03/2022 ?Referring Physician Enzo Bi ?Primary Physician Pcp, No ?Primary Cardiologist Redbird Smith ?Reason for Consultation Chest pain ? ?HPI: ?Carla [name] is a 61 year old female with history of HFpEF, CAD (Non-obstructive by cath 04/2021), PAD s/p iliac stent x2 (most recently 11/2021), paroxysmal A-fib, peripheral vascular disease, COPD, chronic pain, morbid obesity who was referred to the emergency department by cardiology for evaluation of shortness of breath and a fluttering sensation. ? ?There are patient reports right-sided chest discomfort and dizziness when she exerts herself that has been ongoing for a few weeks.  She had been seen on 01/02/2022 after she sustained a fall at a funeral home and hit the right side of her chest.  Following this she had persistent right-sided chest pain with difficulty to take deep breaths.  She was sent for a CTA angiography of the chest which showed a trace right-sided pleural effusion but no other obvious signs of trauma.  She was then seen in the emergency department on 01/18/2022 with complaints of palpitations and shortness of breath as well as chest discomfort.  She had been having a cough productive of some sputum and this was treated for COPD exacerbation with prednisone. ? ?She is sleepy at the time of my evaluation and having a hard time answering questions. She states her heart was racing for the last 3 days and was associated shortness of breath; this is different from usual because it lasted longer. She feels back to normal this morning. She had some pressure in her chest yesterday. No LE edema, orthopnea, PND. ? ? ?Notably the patient has chronic shortness of breath and continues to smoke 1 pack of cigarettes daily. When asked about this she starts crying and talking about her husbands death. She has had significant tragedy in her life in  the last year and she says "she can't handle it anymore." She denies any suicidal ideation.  ? ?Review of systems complete and found to be negative unless listed above  ? ? ? ?Past Medical History:  ?Diagnosis Date  ? Abdominal aortic atherosclerosis ([place])   ? a. 05/2017 CTA abd/pelvis: significant atherosclerotic dzs of the infrarenal abd Ao w/ some mural thrombus. No aneurysm or dissection.  ? Acute focal ischemia of small intestine (HCC)   ? Acute right-sided low back pain with right-sided sciatica 06/13/2017  ? AKI (acute kidney injury) ([place]) 07/29/2017  ? Anginal pain ([place])   ? a. 08/2012 Lexiscan MV: EF 54%, non ischemia/infarct.  ? Anxiety and depression   ? Baker's cyst of knee, right   ? a. 07/2016 U/S: 4.1 x 1.4 x 2.9 cystic structure in R poplitetal fossa.  ? Bell's palsy   ? Borderline diabetes   ? CHF (congestive heart failure) ([place])   ? Chronic anticoagulation   ? apixaban  ? Chronic idiopathic constipation   ? Chronic mesenteric ischemia ([place])   ? Chronic pain   ? COPD (chronic obstructive pulmonary disease) ([place])   ? Diastolic dysfunction   ? a. 05/2017 Echo: Ef 60-65%, no rwma, Gr1 DD, no source of cardiac emboli.  ? Dyspnea   ? 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 a. 05/2017 CTA Abd/pelvis: apparent thrombus or embolus in prox SMA (70-90%); b. 05/2017 catheter directed tPA, mechanical thrombectomy, and stenting of the SMA.  ? Essential hypertension with goal blood pressure less than 140/90 01/19/2016  ? Gastroesophageal reflux  disease 01/19/2016  ? GERD (gastroesophageal reflux disease)   ? H/O colectomy   ? History of 2019 novel coronavirus disease (COVID-19) 11/14/2019  ? History of kidney stones   ? Hyperlipidemia   ? Hypertension   ? Morbid obesity with BMI of 40.0-44.9, adult ([place])   ? Occlusive mesenteric ischemia ([place]) 06/19/2017  ? Osteoarthritis   ? PAD (peripheral artery disease) ([place])   ? PAF (paroxysmal atrial fibrillation) ([place])   ? Palpitations   ? Peripheral edema   ? Personal  history of other malignant neoplasm of skin 06/01/2016  ? Pneumonia   ? Pre-diabetes   ? Primary osteoarthritis of both knees 06/13/2017  ? SBO (small bowel obstruction) ([place]) 08/06/2017  ? Skin cancer of nose   ? 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 Vitamin D deficiency   ?  ?Past Surgical History:  ?Procedure Laterality Date  ? AMPUTATION TOE Left 12/09/2019  ? Procedure: AMPUTATION TOE MPJ left;  Surgeon: Caroline More, DPM;  Location: [facility];  Service: Podiatry;  Laterality: Left;  ? APPENDECTOMY    ? APPLICATION OF WOUND VAC  12/01/2021  ? Procedure: APPLICATION OF WOUND VAC;  Surgeon: Algernon Huxley, MD;  Location: [facility];  Service: Vascular;;  ? CHOLECYSTECTOMY    ? COLON SURGERY    ? EMBOLECTOMY OR THROMBECTOMY, WITH OR WITHOUT CATHETER; FEMOROPOPLITEAL, AORTOILIAC ARTERY, BY LEG INCISION N/A 06/21/2020  ? Left - Decomp Fasciotomy Leg; Ant &/Or Lat Compart Only; Midline - Angiography, Visceral, Selective Or Supraselective (With Or Without Flush Aortogram); Left - Revascularize, Endovasc, Open/Percut, Iliac Artery, Unilat, Initial Vessel; W/Translum Stent, W/Angioplasty; Right - Revascularize, Endovasc, Open/Percut, Iliac Artery, Ea Add`L Ipsilateral; W/Translumin Stent, W/Angioplasty; Location [facility];  ? ENDARTERECTOMY FEMORAL Left 12/01/2021  ? Procedure: ENDARTERECTOMY FEMORAL;  Surgeon: Algernon Huxley, MD;  Location: [facility];  Service: Vascular;  Laterality: Left;  ? ENDOVASCULAR REPAIR/STENT GRAFT Left 12/01/2021  ? Procedure: ENDOVASCULAR REPAIR/STENT GRAFT;  Surgeon: Algernon Huxley, MD;  Location: [place] [facility];  Service: Cardiovascular;  Laterality: Left;  ? LAPAROTOMY N/A 08/08/2017  ? Procedure: EXPLORATORY LAPAROTOMY POSSIBLE BOWEL RESECTION;  Surgeon: Jules Husbands, MD;  Location: [facility];  Service: General;  Laterality: N/A;  ? LEFT HEART CATH AND CORONARY ANGIOGRAPHY N/A 05/17/2021  ? Procedure: LEFT HEART CATH AND CORONARY ANGIOGRAPHY;  Surgeon: Yolonda Kida, MD;  Location: [place] [facility];  Service: Cardiovascular;  Laterality: N/A;  ? LOWER EXTREMITY ANGIOGRAPHY Left 11/17/2019  ? Procedure: LOWER EXTREMITY ANGIOGRAPHY;  Surgeon: Algernon Huxley, MD;  Location: [place] [facility];  Service: Cardiovascular;  Laterality: Left;  ? LOWER EXTREMITY ANGIOGRAPHY Right 12/13/2020  ? Procedure: LOWER EXTREMITY ANGIOGRAPHY;  Surgeon: Algernon Huxley, MD;  Location: [place] [facility];  Service: Cardiovascular;  Laterality: Right;  ? LOWER EXTREMITY ANGIOGRAPHY Right 01/10/2021  ? Procedure: LOWER EXTREMITY ANGIOGRAPHY;  Surgeon: Algernon Huxley, MD;  Location: [place] [facility];  Service: Cardiovascular;  Laterality: Right;  ? LOWER EXTREMITY ANGIOGRAPHY Right 01/24/2021  ? Procedure: LOWER EXTREMITY ANGIOGRAPHY;  Surgeon: Algernon Huxley, MD;  Location: [place] [facility];  Service: Cardiovascular;  Laterality: Right;  ? LOWER EXTREMITY ANGIOGRAPHY Bilateral 06/28/2021  ? Procedure: Lower Extremity Angiography;  Surgeon: Algernon Huxley, MD;  Location: [place] [facility];  Service: Cardiovascular;  Laterality: Bilateral;  ? LOWER EXTREMITY ANGIOGRAPHY Right 11/28/2021  ? Procedure: LOWER EXTREMITY ANGIOGRAPHY;  Surgeon: Algernon Huxley, MD;  Location: [place] [facility];  Service: Cardiovascular;  Laterality: Right;  ? LOWER EXTREMITY ANGIOGRAPHY Left 11/30/2021  ? Procedure: Lower Extremity Angiography;  Surgeon: Algernon Huxley, MD;  Location: [place] [facility];  Service: Cardiovascular;  Laterality: Left;  ? LOWER EXTREMITY INTERVENTION N/A 11/19/2019  ? Procedure: LOWER EXTREMITY INTERVENTION;  Surgeon: Algernon Huxley, MD;  Location: [place] [facility];  Service: Cardiovascular;  Laterality: N/A;  ? LOWER EXTREMITY INTERVENTION Bilateral 06/29/2021  ? Procedure: LOWER EXTREMITY INTERVENTION;  Surgeon: Algernon Huxley, MD;  Location: [place] [facility];  Service: Cardiovascular;  Laterality: Bilateral;  ? TEE WITHOUT CARDIOVERSION N/A 06/22/2017  ? Procedure: TRANSESOPHAGEAL ECHOCARDIOGRAM (TEE);   Surgeon: Wellington Hampshire, MD;  Location: [facility];  Service: Cardiovascular;  Laterality: N/A;  ? TOTAL KNEE ARTHROPLASTY Right 07/11/2018  ? Procedure: TOTAL KNEE ARTHROPLASTY;  Surgeon: Corky Mull,

## 2022-02-04 NOTE — ED Notes (Signed)
Placed request for [facility] inpatient bed with secretary as stretcher very uncomfortable per pt.  ?
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
## 2022-02-04 NOTE — ED Notes (Signed)
Pt up to bedside toilet. Urine sample collected. Pt switched from stretcher to [facility] bed.  ?

## 2022-02-04 NOTE — ED Notes (Signed)
Bed adjusted for pt. Pt requesting lyrica. Pt denies any other needs. Provider Damita Dunnings notified via secure chat.  ?

## 2022-02-04 NOTE — ED Notes (Signed)
Extremity BP's ?LUE 127/80 ?LLE 138/78 ?RLE 89/41 ?RUE 141/94 ?

## 2022-02-04 NOTE — ED Notes (Signed)
Bed requested for pt. ?

## 2022-02-04 NOTE — ED Notes (Signed)
Pt. Disconnected from cardiac monitoring by nursing student to get up to in-room toilet. Pt. Ambulated to toilet and back to bed indep. 378m urine output. ?

## 2022-02-15 ENCOUNTER — Ambulatory Visit (INDEPENDENT_AMBULATORY_CARE_PROVIDER_SITE_OTHER): Payer: Medicare Other

## 2022-02-15 ENCOUNTER — Encounter (INDEPENDENT_AMBULATORY_CARE_PROVIDER_SITE_OTHER): Payer: Self-pay | Admitting: Nurse Practitioner

## 2022-02-15 ENCOUNTER — Ambulatory Visit (INDEPENDENT_AMBULATORY_CARE_PROVIDER_SITE_OTHER): Payer: Medicare Other | Admitting: Nurse Practitioner

## 2022-02-15 VITALS — BP 118/76 | HR 75 | Resp 16 | Wt 232.0 lb

## 2022-02-15 DIAGNOSIS — I10 Essential (primary) hypertension: Secondary | ICD-10-CM

## 2022-02-15 DIAGNOSIS — D689 Coagulation defect, unspecified: Secondary | ICD-10-CM

## 2022-02-15 DIAGNOSIS — I70229 Atherosclerosis of native arteries of extremities with rest pain, unspecified extremity: Secondary | ICD-10-CM | POA: Diagnosis not present

## 2022-02-15 DIAGNOSIS — F17200 Nicotine dependence, unspecified, uncomplicated: Secondary | ICD-10-CM

## 2022-02-15 MED ORDER — TRAMADOL HCL 50 MG PO TABS: 50.0000 mg | ORAL_TABLET | Freq: Three times a day (TID) | ORAL | 0 refills | Status: DC | PRN

## 2022-02-15 NOTE — H&P (View-Only) (Signed)
? ?Subjective:  ? ? Patient ID: Carla [name], female    DOB: 1961-05-26, 61 y.o.   MRN: [id number] ?Chief Complaint  ?Patient presents with  ? Follow-up  ?  [facility] 4 week with ABI  ? ? ?Carla [name] is a 61 year old female that returns today for noninvasive studies.  The patient recently underwent intervention on her left lower extremity including angioplasty followed by endarterectomy. ? ?The patient notes that there has been a significant deterioration in the right lower extremity symptoms.  The patient notes interval shortening of their claudication distance and development of mild rest pain symptoms. No new ulcers or wounds have occurred since the last visit. ? ?There have been no significant changes to the patient's overall health care. ? ?The patient denies amaurosis fugax or recent TIA symptoms. There are no recent neurological changes noted. ?There is no history of DVT, PE or superficial thrombophlebitis. ?The patient denies recent episodes of angina or shortness of breath.  ? ?ABI's Rt=0.63 and Lt=0.78 (previous ABI's Rt=0.90 and Lt=n/a) ?Duplex US of the lower extremity arterial system shows biphasic waveforms in the left lower extremity with monophasic waveforms on the right tibial arteries ? ? ?Review of Systems  ?Cardiovascular:   ?     Claudication and rest pain  ?Musculoskeletal:  Positive for arthralgias.  ?Neurological:  Positive for numbness.  ?All other systems reviewed and are negative. ? ?   ?Objective:  ? Physical Exam ?Vitals reviewed.  ?HENT:  ?   Head: Normocephalic.  ?Cardiovascular:  ?   Rate and Rhythm: Normal rate.  ?   Pulses:     ?     Dorsalis pedis pulses are 0 on the right side and 1+ on the left side.  ?     Posterior tibial pulses are 0 on the right side and 1+ on the left side.  ?Pulmonary:  ?   Effort: Pulmonary effort is normal.  ?Skin: ?   General: Skin is warm and dry.  ?Neurological:  ?   Mental Status: She is alert and oriented to person, place, and time.  ?Psychiatric:     ?    Mood and Affect: Mood normal.     ?   Behavior: Behavior normal.     ?   Thought Content: Thought content normal.     ?   Judgment: Judgment normal.  ? ? ?BP 118/76 (BP Location: Right Arm)   Pulse 75   Resp 16   Wt 232 lb (105.2 kg)   BMI 42.43 kg/m?  ? ?Past Medical History:  ?Diagnosis Date  ? Abdominal aortic atherosclerosis ([place])   ? a. 05/2017 CTA abd/pelvis: significant atherosclerotic dzs of the infrarenal abd Ao w/ some mural thrombus. No aneurysm or dissection.  ? Acute focal ischemia of small intestine (HCC)   ? Acute right-sided low back pain with right-sided sciatica 06/13/2017  ? AKI (acute kidney injury) ([place]) 07/29/2017  ? Anginal pain ([place])   ? a. 08/2012 Lexiscan MV: EF 54%, non ischemia/infarct.  ? Anxiety and depression   ? Baker's cyst of knee, right   ? a. 07/2016 U/S: 4.1 x 1.4 x 2.9 cystic structure in R poplitetal fossa.  ? Bell's palsy   ? Borderline diabetes   ? CHF (congestive heart failure) ([place])   ? Chronic anticoagulation   ? apixaban  ? Chronic idiopathic constipation   ? Chronic mesenteric ischemia ([place])   ? Chronic pain   ? COPD (chronic obstructive pulmonary disease) ([place])   ?  Diastolic dysfunction   ? a. 05/2017 Echo: Ef 60-65%, no rwma, Gr1 DD, no source of cardiac emboli.  ? Dyspnea   ? 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[place])   ? a. 05/2017 CTA Abd/pelvis: apparent thrombus or embolus in prox SMA (70-90%); b. 05/2017 catheter directed tPA, mechanical thrombectomy, and stenting of the SMA.  ? Essential hypertension with goal blood pressure less than 140/90 01/19/2016  ? Gastroesophageal reflux disease 01/19/2016  ? GERD (gastroesophageal reflux disease)   ? H/O colectomy   ? History of 2019 novel coronavirus disease (COVID-19) 11/14/2019  ? History of kidney stones   ? Hyperlipidemia   ? Hypertension   ? Morbid obesity with BMI of 40.0-44.9, adult ([place])   ? Occlusive mesenteric ischemia ([place]) 06/19/2017  ? Osteoarthritis   ? PAD (peripheral artery disease) ([place])   ? PAF  (paroxysmal atrial fibrillation) ([place])   ? Palpitations   ? Peripheral edema   ? Personal history of other malignant neoplasm of skin 06/01/2016  ? Pneumonia   ? Pre-diabetes   ? Primary osteoarthritis of both knees 06/13/2017  ? SBO (small bowel obstruction) ([place]) 08/06/2017  ? Skin cancer of nose   ? 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 Vitamin D deficiency   ? ? ?Social History  ? ?Socioeconomic History  ? Marital status: Significant Other  ?  Spouse name: [name]   ? Number of children: Not on file  ? Years of education: Not on file  ? Highest education level: Not on file  ?Occupational History  ? Occupation: Unemployed  ?Tobacco Use  ? Smoking status: Every Day  ?  Packs/day: 1.00  ?  Types: Cigarettes  ? Smokeless tobacco: Never  ?Vaping Use  ? Vaping Use: Never used  ?Substance and Sexual Activity  ? Alcohol use: No  ? Drug use: No  ? Sexual activity: Not on file  ?Other Topics Concern  ? Not on file  ?Social History Narrative  ? Lives in [place] with boyfriend.  Does not routinely exercise.  ? ?Social Determinants of Health  ? ?Financial Resource Strain: Not on file  ?Food Insecurity: Not on file  ?Transportation Needs: Not on file  ?Physical Activity: Not on file  ?Stress: Not on file  ?Social Connections: Not on file  ?Intimate Partner Violence: Not on file  ? ? ?Past Surgical History:  ?Procedure Laterality Date  ? AMPUTATION TOE Left 12/09/2019  ? Procedure: AMPUTATION TOE MPJ left;  Surgeon: Caroline More, DPM;  Location: [facility];  Service: Podiatry;  Laterality: Left;  ? APPENDECTOMY    ? APPLICATION OF WOUND VAC  12/01/2021  ? Procedure: APPLICATION OF WOUND VAC;  Surgeon: Algernon Huxley, MD;  Location: [facility];  Service: Vascular;;  ? CHOLECYSTECTOMY    ? COLON SURGERY    ? EMBOLECTOMY OR THROMBECTOMY, WITH OR WITHOUT CATHETER; FEMOROPOPLITEAL, AORTOILIAC ARTERY, BY LEG INCISION N/A 06/21/2020  ? Left - Decomp Fasciotomy Leg; Ant &/Or Lat Compart Only; Midline - Angiography, Visceral,  Selective Or Supraselective (With Or Without Flush Aortogram); Left - Revascularize, Endovasc, Open/Percut, Iliac Artery, Unilat, Initial Vessel; W/Translum Stent, W/Angioplasty; Right - Revascularize, Endovasc, Open/Percut, Iliac Artery, Ea Add`L Ipsilateral; W/Translumin Stent, W/Angioplasty; Location [facility];  ? ENDARTERECTOMY FEMORAL Left 12/01/2021  ? Procedure: ENDARTERECTOMY FEMORAL;  Surgeon: Algernon Huxley, MD;  Location: [facility];  Service: Vascular;  Laterality: Left;  ? ENDOVASCULAR REPAIR/STENT GRAFT Left 12/01/2021  ? Procedure: ENDOVASCULAR REPAIR/STENT GRAFT;  Surgeon: Algernon Huxley, MD;  Location: [place] [facility];  Service: Cardiovascular;  Laterality: Left;  ? LAPAROTOMY N/A 08/08/2017  ? Procedure: EXPLORATORY LAPAROTOMY POSSIBLE BOWEL RESECTION;  Surgeon: Jules Husbands, MD;  Location: [facility];  Service: General;  Laterality: N/A;  ? LEFT HEART CATH AND CORONARY ANGIOGRAPHY N/A 05/17/2021  ? Procedure: LEFT HEART CATH AND CORONARY ANGIOGRAPHY;  Surgeon: Yolonda Kida, MD;  Location: [place] [facility];  Service: Cardiovascular;  Laterality: N/A;  ? LOWER EXTREMITY ANGIOGRAPHY Left 11/17/2019  ? Procedure: LOWER EXTREMITY ANGIOGRAPHY;  Surgeon: Algernon Huxley, MD;  Location: [place] [facility];  Service: Cardiovascular;  Laterality: Left;  ? LOWER EXTREMITY ANGIOGRAPHY Right 12/13/2020  ? Procedure: LOWER EXTREMITY ANGIOGRAPHY;  Surgeon: Algernon Huxley, MD;  Location: [place] [facility];  Service: Cardiovascular;  Laterality: Right;  ? LOWER EXTREMITY ANGIOGRAPHY Right 01/10/2021  ? Procedure: LOWER EXTREMITY ANGIOGRAPHY;  Surgeon: Algernon Huxley, MD;  Location: [place] [facility];  Service: Cardiovascular;  Laterality: Right;  ? LOWER EXTREMITY ANGIOGRAPHY Right 01/24/2021  ? Procedure: LOWER EXTREMITY ANGIOGRAPHY;  Surgeon: Algernon Huxley, MD;  Location: [place] [facility];  Service: Cardiovascular;  Laterality: Right;  ? LOWER EXTREMITY ANGIOGRAPHY Bilateral 06/28/2021  ? Procedure: Lower  Extremity Angiography;  Surgeon: Algernon Huxley, MD;  Location: [place] [facility];  Service: Cardiovascular;  Laterality: Bilateral;  ? LOWER EXTREMITY ANGIOGRAPHY Right 11/28/2021  ? Procedure: LOWER EXTREMITY ANGIOG

## 2022-02-15 NOTE — Progress Notes (Signed)
? ?Subjective:  ? ? Patient ID: Carla [name], female    DOB: [birth year], 61 y.o.   MRN: [id number] ?Chief Complaint  ?Patient presents with  ? Follow-up  ?  [facility] 4 week with ABI  ? ? ?Carla [name] is a 61 year old female that returns today for noninvasive studies.  The patient recently underwent intervention on her left lower extremity including angioplasty followed by endarterectomy. ? ?The patient notes that there has been a significant deterioration in the right lower extremity symptoms.  The patient notes interval shortening of their claudication distance and development of mild rest pain symptoms. No new ulcers or wounds have occurred since the last visit. ? ?There have been no significant changes to the patient's overall health care. ? ?The patient denies amaurosis fugax or recent TIA symptoms. There are no recent neurological changes noted. ?There is no history of DVT, PE or superficial thrombophlebitis. ?The patient denies recent episodes of angina or shortness of breath.  ? ?ABI's Rt=0.63 and Lt=0.78 (previous ABI's Rt=0.90 and Lt=n/a) ?Duplex US of the lower extremity arterial system shows biphasic waveforms in the left lower extremity with monophasic waveforms on the right tibial arteries ? ? ?Review of Systems  ?Cardiovascular:   ?     Claudication and rest pain  ?Musculoskeletal:  Positive for arthralgias.  ?Neurological:  Positive for numbness.  ?All other systems reviewed and are negative. ? ?   ?Objective:  ? Physical Exam ?Vitals reviewed.  ?HENT:  ?   Head: Normocephalic.  ?Cardiovascular:  ?   Rate and Rhythm: Normal rate.  ?   Pulses:     ?     Dorsalis pedis pulses are 0 on the right side and 1+ on the left side.  ?     Posterior tibial pulses are 0 on the right side and 1+ on the left side.  ?Pulmonary:  ?   Effort: Pulmonary effort is normal.  ?Skin: ?   General: Skin is warm and dry.  ?Neurological:  ?   Mental Status: She is alert and oriented to person, place, and time.  ?Psychiatric:     ?    Mood and Affect: Mood normal.     ?   Behavior: Behavior normal.     ?   Thought Content: Thought content normal.     ?   Judgment: Judgment normal.  ? ? ?BP 118/76 (BP Location: Right Arm)   Pulse 75   Resp 16   Wt 232 lb (105.2 kg)   BMI 42.43 kg/m?  ? ?Past Medical History:  ?Diagnosis Date  ? Abdominal aortic atherosclerosis ([place])   ? a. 05/2017 CTA abd/pelvis: significant atherosclerotic dzs of the infrarenal abd Ao w/ some mural thrombus. No aneurysm or dissection.  ? Acute focal ischemia of small intestine (HCC)   ? Acute right-sided low back pain with right-sided sciatica 06/13/2017  ? AKI (acute kidney injury) ([place]) 07/29/2017  ? Anginal pain ([place])   ? a. 08/2012 Lexiscan MV: EF 54%, non ischemia/infarct.  ? Anxiety and depression   ? Baker's cyst of knee, right   ? a. 07/2016 U/S: 4.1 x 1.4 x 2.9 cystic structure in R poplitetal fossa.  ? Bell's palsy   ? Borderline diabetes   ? CHF (congestive heart failure) ([place])   ? Chronic anticoagulation   ? apixaban  ? Chronic idiopathic constipation   ? Chronic mesenteric ischemia ([place])   ? Chronic pain   ? COPD (chronic obstructive pulmonary disease) ([place])   ?  Diastolic dysfunction   ? a. 05/2017 Echo: Ef 60-65%, no rwma, Gr1 DD, no source of cardiac emboli.  ? Dyspnea   ? 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[place])   ? a. 05/2017 CTA Abd/pelvis: apparent thrombus or embolus in prox SMA (70-90%); b. 05/2017 catheter directed tPA, mechanical thrombectomy, and stenting of the SMA.  ? Essential hypertension with goal blood pressure less than 140/90 01/19/2016  ? Gastroesophageal reflux disease 01/19/2016  ? GERD (gastroesophageal reflux disease)   ? H/O colectomy   ? History of 2019 novel coronavirus disease (COVID-19) 11/14/2019  ? History of kidney stones   ? Hyperlipidemia   ? Hypertension   ? Morbid obesity with BMI of 40.0-44.9, adult ([place])   ? Occlusive mesenteric ischemia ([place]) 06/19/2017  ? Osteoarthritis   ? PAD (peripheral artery disease) ([place])   ? PAF  (paroxysmal atrial fibrillation) ([place])   ? Palpitations   ? Peripheral edema   ? Personal history of other malignant neoplasm of skin 06/01/2016  ? Pneumonia   ? Pre-diabetes   ? Primary osteoarthritis of both knees 06/13/2017  ? SBO (small bowel obstruction) ([place]) 08/06/2017  ? Skin cancer of nose   ? 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 Vitamin D deficiency   ? ? ?Social History  ? ?Socioeconomic History  ? Marital status: Significant Other  ?  Spouse name: Carla [name]   ? Number of children: Not on file  ? Years of education: Not on file  ? Highest education level: Not on file  ?Occupational History  ? Occupation: Unemployed  ?Tobacco Use  ? Smoking status: Every Day  ?  Packs/day: 1.00  ?  Types: Cigarettes  ? Smokeless tobacco: Never  ?Vaping Use  ? Vaping Use: Never used  ?Substance and Sexual Activity  ? Alcohol use: No  ? Drug use: No  ? Sexual activity: Not on file  ?Other Topics Concern  ? Not on file  ?Social History Narrative  ? Lives in [place] with boyfriend.  Does not routinely exercise.  ? ?Social Determinants of Health  ? ?Financial Resource Strain: Not on file  ?Food Insecurity: Not on file  ?Transportation Needs: Not on file  ?Physical Activity: Not on file  ?Stress: Not on file  ?Social Connections: Not on file  ?Intimate Partner Violence: Not on file  ? ? ?Past Surgical History:  ?Procedure Laterality Date  ? AMPUTATION TOE Left 12/09/2019  ? Procedure: AMPUTATION TOE MPJ left;  Surgeon: Caroline More, DPM;  Location: [facility];  Service: Podiatry;  Laterality: Left;  ? APPENDECTOMY    ? APPLICATION OF WOUND VAC  12/01/2021  ? Procedure: APPLICATION OF WOUND VAC;  Surgeon: Algernon Huxley, MD;  Location: [facility];  Service: Vascular;;  ? CHOLECYSTECTOMY    ? COLON SURGERY    ? EMBOLECTOMY OR THROMBECTOMY, WITH OR WITHOUT CATHETER; FEMOROPOPLITEAL, AORTOILIAC ARTERY, BY LEG INCISION N/A 06/21/2020  ? Left - Decomp Fasciotomy Leg; Ant &/Or Lat Compart Only; Midline - Angiography, Visceral,  Selective Or Supraselective (With Or Without Flush Aortogram); Left - Revascularize, Endovasc, Open/Percut, Iliac Artery, Unilat, Initial Vessel; W/Translum Stent, W/Angioplasty; Right - Revascularize, Endovasc, Open/Percut, Iliac Artery, Ea Add`L Ipsilateral; W/Translumin Stent, W/Angioplasty; Location [facility];  ? ENDARTERECTOMY FEMORAL Left 12/01/2021  ? Procedure: ENDARTERECTOMY FEMORAL;  Surgeon: Algernon Huxley, MD;  Location: [facility];  Service: Vascular;  Laterality: Left;  ? ENDOVASCULAR REPAIR/STENT GRAFT Left 12/01/2021  ? Procedure: ENDOVASCULAR REPAIR/STENT GRAFT;  Surgeon: Algernon Huxley, MD;  Location: [place] [facility];  Service: Cardiovascular;  Laterality: Left;  ? LAPAROTOMY N/A 08/08/2017  ? Procedure: EXPLORATORY LAPAROTOMY POSSIBLE BOWEL RESECTION;  Surgeon: Jules Husbands, MD;  Location: [facility];  Service: General;  Laterality: N/A;  ? LEFT HEART CATH AND CORONARY ANGIOGRAPHY N/A 05/17/2021  ? Procedure: LEFT HEART CATH AND CORONARY ANGIOGRAPHY;  Surgeon: Yolonda Kida, MD;  Location: [place] [facility];  Service: Cardiovascular;  Laterality: N/A;  ? LOWER EXTREMITY ANGIOGRAPHY Left 11/17/2019  ? Procedure: LOWER EXTREMITY ANGIOGRAPHY;  Surgeon: Algernon Huxley, MD;  Location: [place] [facility];  Service: Cardiovascular;  Laterality: Left;  ? LOWER EXTREMITY ANGIOGRAPHY Right 12/13/2020  ? Procedure: LOWER EXTREMITY ANGIOGRAPHY;  Surgeon: Algernon Huxley, MD;  Location: [place] [facility];  Service: Cardiovascular;  Laterality: Right;  ? LOWER EXTREMITY ANGIOGRAPHY Right 01/10/2021  ? Procedure: LOWER EXTREMITY ANGIOGRAPHY;  Surgeon: Algernon Huxley, MD;  Location: [place] [facility];  Service: Cardiovascular;  Laterality: Right;  ? LOWER EXTREMITY ANGIOGRAPHY Right 01/24/2021  ? Procedure: LOWER EXTREMITY ANGIOGRAPHY;  Surgeon: Algernon Huxley, MD;  Location: [place] [facility];  Service: Cardiovascular;  Laterality: Right;  ? LOWER EXTREMITY ANGIOGRAPHY Bilateral 06/28/2021  ? Procedure: Lower  Extremity Angiography;  Surgeon: Algernon Huxley, MD;  Location: [place] [facility];  Service: Cardiovascular;  Laterality: Bilateral;  ? LOWER EXTREMITY ANGIOGRAPHY Right 11/28/2021  ? Procedure: LOWER EXTREMITY ANGIOG

## 2022-02-16 ENCOUNTER — Telehealth (INDEPENDENT_AMBULATORY_CARE_PROVIDER_SITE_OTHER): Payer: Self-pay

## 2022-02-16 NOTE — Telephone Encounter (Signed)
Spoke with the patient and she is scheduled with Dr. Lucky Cowboy for a RLE angio on 02/22/22 with a 9:45 am arrival time to the MM. Pre-procedure instructions were discussed and will be mailed. ?

## 2022-02-20 ENCOUNTER — Ambulatory Visit: Payer: Medicare Other | Admitting: Surgery

## 2022-02-22 ENCOUNTER — Ambulatory Visit
Admission: RE | Admit: 2022-02-22 | Discharge: 2022-02-22 | Disposition: A | Discharge status: Home / Self Care | Payer: Medicare Other | Source: Ambulatory Visit | Attending: Vascular Surgery | Admitting: Vascular Surgery

## 2022-02-22 ENCOUNTER — Encounter: Payer: Self-pay | Admitting: Vascular Surgery

## 2022-02-22 ENCOUNTER — Encounter
Admission: RE | Disposition: A | Discharge status: Home / Self Care | Payer: Self-pay | Source: Ambulatory Visit | Attending: Vascular Surgery

## 2022-02-22 DIAGNOSIS — D689 Coagulation defect, unspecified: Secondary | ICD-10-CM | POA: Insufficient documentation

## 2022-02-22 DIAGNOSIS — I70221 Atherosclerosis of native arteries of extremities with rest pain, right leg: Secondary | ICD-10-CM | POA: Diagnosis not present

## 2022-02-22 DIAGNOSIS — I10 Essential (primary) hypertension: Secondary | ICD-10-CM | POA: Diagnosis not present

## 2022-02-22 DIAGNOSIS — F17210 Nicotine dependence, cigarettes, uncomplicated: Secondary | ICD-10-CM | POA: Insufficient documentation

## 2022-02-22 DIAGNOSIS — I70229 Atherosclerosis of native arteries of extremities with rest pain, unspecified extremity: Secondary | ICD-10-CM

## 2022-02-22 DIAGNOSIS — Z95828 Presence of other vascular implants and grafts: Secondary | ICD-10-CM | POA: Diagnosis not present

## 2022-02-22 DIAGNOSIS — G629 Polyneuropathy, unspecified: Secondary | ICD-10-CM | POA: Insufficient documentation

## 2022-02-22 DIAGNOSIS — I70222 Atherosclerosis of native arteries of extremities with rest pain, left leg: Secondary | ICD-10-CM | POA: Insufficient documentation

## 2022-02-22 HISTORY — PX: LOWER EXTREMITY ANGIOGRAPHY: CATH118251

## 2022-02-22 LAB — BUN: BUN: 17 mg/dL (ref 8–23)

## 2022-02-22 LAB — CREATININE, SERUM
Creatinine, Ser: 1.1 mg/dL — ABNORMAL HIGH (ref 0.44–1.00)
GFR, Estimated: 57 mL/min — ABNORMAL LOW (ref >60)

## 2022-02-22 SURGERY — LOWER EXTREMITY ANGIOGRAPHY
Anesthesia: Moderate Sedation | Site: Leg Lower | Laterality: Right

## 2022-02-22 MED ORDER — SODIUM CHLORIDE 0.9 % IV SOLN: INTRAVENOUS | Status: DC

## 2022-02-22 MED ORDER — FAMOTIDINE 20 MG PO TABS: 40.0000 mg | ORAL_TABLET | Freq: Once | ORAL | Status: DC | PRN

## 2022-02-22 MED ORDER — HEPARIN SODIUM (PORCINE) 1000 UNIT/ML IJ SOLN
INTRAMUSCULAR | Status: DC | PRN
  Administered 2022-02-22: 3000 [IU] via INTRAVENOUS

## 2022-02-22 MED ORDER — ONDANSETRON HCL 4 MG/2ML IJ SOLN: 4.0000 mg | Freq: Four times a day (QID) | INTRAMUSCULAR | Status: DC | PRN

## 2022-02-22 MED ORDER — IODIXANOL 320 MG/ML IV SOLN
INTRAVENOUS | Status: DC | PRN
  Administered 2022-02-22: 60 mL via INTRA_ARTERIAL

## 2022-02-22 MED ORDER — MIDAZOLAM HCL 2 MG/ML PO SYRP: 8.0000 mg | ORAL_SOLUTION | Freq: Once | ORAL | Status: DC | PRN

## 2022-02-22 MED ORDER — HYDROMORPHONE HCL 1 MG/ML IJ SOLN: 1.0000 mg | Freq: Once | INTRAMUSCULAR | Status: DC | PRN

## 2022-02-22 MED ORDER — MIDAZOLAM HCL 2 MG/2ML IJ SOLN
INTRAMUSCULAR | Status: DC | PRN
  Administered 2022-02-22: 1 mg via INTRAVENOUS
  Administered 2022-02-22: 2 mg via INTRAVENOUS

## 2022-02-22 MED ORDER — MIDAZOLAM HCL 2 MG/2ML IJ SOLN
INTRAMUSCULAR | Status: AC
  Filled 2022-02-22: qty 4

## 2022-02-22 MED ORDER — CEFAZOLIN SODIUM-DEXTROSE 2-4 GM/100ML-% IV SOLN
INTRAVENOUS | Status: AC
Start: 2022-02-22 — End: 2022-02-22
  Administered 2022-02-22: 2 g via INTRAVENOUS
  Filled 2022-02-22: qty 100

## 2022-02-22 MED ORDER — CEFAZOLIN SODIUM-DEXTROSE 2-4 GM/100ML-% IV SOLN: 2.0000 g | INTRAVENOUS | Status: AC

## 2022-02-22 MED ORDER — FENTANYL CITRATE (PF) 100 MCG/2ML IJ SOLN
INTRAMUSCULAR | Status: DC | PRN
  Administered 2022-02-22 (×2): 25 ug via INTRAVENOUS
  Administered 2022-02-22: 50 ug via INTRAVENOUS

## 2022-02-22 MED ORDER — METHYLPREDNISOLONE SODIUM SUCC 125 MG IJ SOLR: 125.0000 mg | Freq: Once | INTRAMUSCULAR | Status: DC | PRN

## 2022-02-22 MED ORDER — FENTANYL CITRATE PF 50 MCG/ML IJ SOSY
PREFILLED_SYRINGE | INTRAMUSCULAR | Status: AC
  Filled 2022-02-22: qty 2

## 2022-02-22 MED ORDER — HEPARIN SODIUM (PORCINE) 1000 UNIT/ML IJ SOLN
INTRAMUSCULAR | Status: AC
  Filled 2022-02-22: qty 10

## 2022-02-22 MED ORDER — DIPHENHYDRAMINE HCL 50 MG/ML IJ SOLN: 50.0000 mg | Freq: Once | INTRAMUSCULAR | Status: DC | PRN

## 2022-02-22 SURGICAL SUPPLY — 15 items
CANNULA 5F STIFF (CANNULA) ×1 IMPLANT
CATH ANGIO 5F PIGTAIL 65CM (CATHETERS) ×1 IMPLANT
CATH TEMPO 5F RIM 65CM (CATHETERS) ×1 IMPLANT
CATH VS15FR (CATHETERS) ×1 IMPLANT
COVER PROBE U/S 5X48 (MISCELLANEOUS) ×1 IMPLANT
DEVICE STARCLOSE SE CLOSURE (Vascular Products) ×1 IMPLANT
DEVICE TORQUE (MISCELLANEOUS) ×1 IMPLANT
GLIDECATH 4FR STR (CATHETERS) ×1 IMPLANT
GLIDEWIRE ADV .035X260CM (WIRE) ×1 IMPLANT
GUIDEWIRE ANGLED .035 180CM (WIRE) ×1 IMPLANT
PACK ANGIOGRAPHY (CUSTOM PROCEDURE TRAY) ×2 IMPLANT
SHEATH BRITE TIP 5FRX11 (SHEATH) ×1 IMPLANT
SYR MEDRAD MARK 7 150ML (SYRINGE) ×1 IMPLANT
TUBING CONTRAST HIGH PRESS 72 (TUBING) ×1 IMPLANT
WIRE GUIDERIGHT .035X150 (WIRE) ×1 IMPLANT

## 2022-02-22 NOTE — Op Note (Signed)
[facility] ? Percutaneous Study/Intervention Procedural Note ? ? ?Date of Surgery: 02/22/2022 ? ?Surgeon(s):Jeremy Ditullio   ? ?Assistants:none ? ?Pre-operative Diagnosis: PAD with multiple previous interventions and pain worrisome for rest pain of the left lower extremity, known neuropathy as well ? ?Post-operative diagnosis:  Same ? ?Procedure(s) Performed: ?            1.  Ultrasound guidance for vascular access left femoral artery ?            2.  Catheter placement into right SFA from left femoral approach ?            3.  Aortogram and selective right lower extremity angiogram ?            4.  StarClose closure device left femoral artery ? ?EBL: 10 cc ? ?Contrast: 60 cc ? ?Fluoro Time: 9.5 minutes ? ?Moderate Conscious Sedation Time: approximately 52 minutes using 3 mg of Versed and 100 mcg of Fentanyl ?             ?Indications:  Patient is a 61 y.o.female with a long history of vascular disease and multiple previous interventions with a reduced right ABI and pain worrisome for rest pain. The patient is brought in for angiography for further evaluation and potential treatment.  Due to the limb threatening nature of the situation, angiogram was performed for attempted limb salvage. The patient is aware that if the procedure fails, amputation would be expected.  The patient also understands that even with successful revascularization, amputation may still be required due to the severity of the situation.  Risks and benefits are discussed and informed consent is obtained.  ? ?Procedure:  The patient was identified and appropriate procedural time out was performed.  The patient was then placed supine on the table and prepped and draped in the usual sterile fashion. Moderate conscious sedation was administered during a face to face encounter with the patient throughout the procedure with my supervision of the RN administering medicines and monitoring the patient's vital signs, pulse oximetry,  telemetry and mental status throughout from the start of the procedure until the patient was taken to the recovery room. Ultrasound was used to evaluate the left common femoral artery.  It was patent .  A digital ultrasound image was acquired.  A Seldinger needle was used to access the left common femoral artery under direct ultrasound guidance and a permanent image was performed.  A 0.035 J wire was advanced without resistance and a 5Fr sheath was placed.  Pigtail catheter was placed into the aorta and an AP aortogram was performed. This demonstrated normal renal arteries and normal aorta and iliac segments without significant stenosis within the previously stented portions of the distal aorta and bilateral iliac artery. I then crossed the aortic bifurcation and advanced to the right femoral head.  Crossing the bifurcation was extremely tedious due to the steep projection from her previous aortoiliac stents even with the Endologix stent graft in place.  Ultimately, we had to use a V S1 catheter and an advantage or floppy Glidewire to then exchanged for a glide catheter.  This was placed first in the proximal profunda femoris artery and later in the proximal superficial femoral artery for right lower extremity imaging.  Selective right lower extremity angiogram was then performed. This demonstrated a fairly normal common femoral artery, profunda femoris artery, superficial femoral and popliteal arteries.  The dominant runoff distally was the peroneal artery with what appeared to  be occlusion of the posterior tibial artery in the mid to distal segment and the anterior tibial artery in the midsegment without distal reconstitution of these vessels.  With only small vessel disease and no obvious focal large vessel stenosis, there was no role for endovascular intervention.  I elected to terminate the procedure. The sheath was removed and StarClose closure device was deployed in the left femoral artery with excellent  hemostatic result. The patient was taken to the recovery room in stable condition having tolerated the procedure well. ? ?Findings:   ?            Aortogram:  This demonstrated normal renal arteries and normal aorta and iliac segments without significant stenosis within the previously stented portions of the distal aorta and bilateral iliac artery. ?            Right lower Extremity: This demonstrated a fairly normal common femoral artery, profunda femoris artery, superficial femoral and popliteal arteries.  The dominant runoff distally was the peroneal artery with what appeared to be occlusion of the posterior tibial artery in the mid to distal segment and the anterior tibial artery in the midsegment without distal reconstitution of these vessels ? ? ?Disposition: Patient was taken to the recovery room in stable condition having tolerated the procedure well. ? ?Complications: None ? ?[name] ?02/22/2022 ?11:56 AM ? ? ?This note was created with Dragon Medical transcription system. Any errors in dictation are purely unintentional.  ?

## 2022-02-22 NOTE — Interval H&P Note (Signed)
History and Physical Interval Note: ? ?02/22/2022 ?10:36 AM ? ?Carla [name]  has presented today for surgery, with the diagnosis of RLE Angio  BARD   ASO w rest pain.  The various methods of treatment have been discussed with the patient and family. After consideration of risks, benefits and other options for treatment, the patient has consented to  Procedure(s): ?Lower Extremity Angiography (Right) as a surgical intervention.  The patient's history has been reviewed, patient examined, no change in status, stable for surgery.  I have reviewed the patient's chart and labs.  Questions were answered to the patient's satisfaction.   ? ? ?[name] ? ? ?

## 2022-03-02 ENCOUNTER — Telehealth (INDEPENDENT_AMBULATORY_CARE_PROVIDER_SITE_OTHER): Payer: Self-pay | Admitting: Vascular Surgery

## 2022-03-02 NOTE — Telephone Encounter (Signed)
Ms. Litsey called and states she is in real bad pain.  More pain now than before the surgery.  She would like to come in to see Dr. Lucky Cowboy before next month.  Please advise ?

## 2022-03-02 NOTE — Telephone Encounter (Signed)
She can come with ABIs where we have time on schedule.

## 2022-03-03 ENCOUNTER — Other Ambulatory Visit (INDEPENDENT_AMBULATORY_CARE_PROVIDER_SITE_OTHER): Payer: Self-pay | Admitting: Nurse Practitioner

## 2022-03-03 DIAGNOSIS — I70229 Atherosclerosis of native arteries of extremities with rest pain, unspecified extremity: Secondary | ICD-10-CM

## 2022-03-03 NOTE — Telephone Encounter (Signed)
Patient left a message requesting a refill for Tramadol '50mg'$  for right leg pain. Medication has been called into pharmacy and a message was left on patient voicemail. ?

## 2022-03-06 ENCOUNTER — Other Ambulatory Visit (INDEPENDENT_AMBULATORY_CARE_PROVIDER_SITE_OTHER): Payer: Self-pay | Admitting: Nurse Practitioner

## 2022-03-06 DIAGNOSIS — M79606 Pain in leg, unspecified: Secondary | ICD-10-CM

## 2022-03-07 ENCOUNTER — Ambulatory Visit (INDEPENDENT_AMBULATORY_CARE_PROVIDER_SITE_OTHER): Payer: Medicare Other | Admitting: Vascular Surgery

## 2022-03-07 ENCOUNTER — Ambulatory Visit (INDEPENDENT_AMBULATORY_CARE_PROVIDER_SITE_OTHER): Payer: Medicare Other | Admitting: Nurse Practitioner

## 2022-03-07 ENCOUNTER — Ambulatory Visit (INDEPENDENT_AMBULATORY_CARE_PROVIDER_SITE_OTHER): Payer: Medicare Other

## 2022-03-07 DIAGNOSIS — E785 Hyperlipidemia, unspecified: Secondary | ICD-10-CM | POA: Diagnosis not present

## 2022-03-07 DIAGNOSIS — I70229 Atherosclerosis of native arteries of extremities with rest pain, unspecified extremity: Secondary | ICD-10-CM | POA: Diagnosis not present

## 2022-03-07 DIAGNOSIS — F17200 Nicotine dependence, unspecified, uncomplicated: Secondary | ICD-10-CM | POA: Diagnosis not present

## 2022-03-07 DIAGNOSIS — I10 Essential (primary) hypertension: Secondary | ICD-10-CM | POA: Diagnosis not present

## 2022-03-07 DIAGNOSIS — M79606 Pain in leg, unspecified: Secondary | ICD-10-CM

## 2022-03-07 MED ORDER — HYDROCODONE-ACETAMINOPHEN 5-325 MG PO TABS: 1.0000 | ORAL_TABLET | Freq: Four times a day (QID) | ORAL | 0 refills | Status: DC | PRN

## 2022-03-08 ENCOUNTER — Telehealth (INDEPENDENT_AMBULATORY_CARE_PROVIDER_SITE_OTHER): Payer: Self-pay

## 2022-03-08 NOTE — Telephone Encounter (Signed)
I attempted to contact the patient to scheduled for a RLE angio with Dr. Lucky Cowboy. A message was left for a return call. ?

## 2022-03-10 ENCOUNTER — Telehealth (INDEPENDENT_AMBULATORY_CARE_PROVIDER_SITE_OTHER): Payer: Self-pay

## 2022-03-10 NOTE — Telephone Encounter (Signed)
Patient returned my call and is scheduled with Dr. Lucky Cowboy for a RLE angio on 03/13/22 with a 9:30 am arrival time to the MM. Pre-procedure instructions were discussed and patient understood.

## 2022-03-13 ENCOUNTER — Encounter: Payer: Self-pay | Admitting: Vascular Surgery

## 2022-03-13 ENCOUNTER — Telehealth (INDEPENDENT_AMBULATORY_CARE_PROVIDER_SITE_OTHER): Payer: Self-pay | Admitting: Nurse Practitioner

## 2022-03-13 ENCOUNTER — Encounter
Admission: RE | Disposition: A | Discharge status: Home / Self Care | Payer: Self-pay | Source: Home / Self Care | Attending: Vascular Surgery

## 2022-03-13 ENCOUNTER — Ambulatory Visit
Admission: RE | Admit: 2022-03-13 | Discharge: 2022-03-13 | Disposition: A | Discharge status: Home / Self Care | Payer: Medicare Other | Attending: Vascular Surgery | Admitting: Vascular Surgery

## 2022-03-13 ENCOUNTER — Other Ambulatory Visit: Payer: Self-pay

## 2022-03-13 ENCOUNTER — Encounter (INDEPENDENT_AMBULATORY_CARE_PROVIDER_SITE_OTHER): Payer: Self-pay | Admitting: Nurse Practitioner

## 2022-03-13 ENCOUNTER — Other Ambulatory Visit (INDEPENDENT_AMBULATORY_CARE_PROVIDER_SITE_OTHER): Payer: Self-pay | Admitting: Nurse Practitioner

## 2022-03-13 DIAGNOSIS — I743 Embolism and thrombosis of arteries of the lower extremities: Secondary | ICD-10-CM | POA: Diagnosis not present

## 2022-03-13 DIAGNOSIS — R7303 Prediabetes: Secondary | ICD-10-CM | POA: Diagnosis not present

## 2022-03-13 DIAGNOSIS — I252 Old myocardial infarction: Secondary | ICD-10-CM | POA: Diagnosis not present

## 2022-03-13 DIAGNOSIS — I70221 Atherosclerosis of native arteries of extremities with rest pain, right leg: Secondary | ICD-10-CM | POA: Diagnosis present

## 2022-03-13 DIAGNOSIS — E785 Hyperlipidemia, unspecified: Secondary | ICD-10-CM | POA: Diagnosis not present

## 2022-03-13 DIAGNOSIS — I10 Essential (primary) hypertension: Secondary | ICD-10-CM | POA: Diagnosis not present

## 2022-03-13 DIAGNOSIS — I70202 Unspecified atherosclerosis of native arteries of extremities, left leg: Secondary | ICD-10-CM

## 2022-03-13 DIAGNOSIS — I70229 Atherosclerosis of native arteries of extremities with rest pain, unspecified extremity: Secondary | ICD-10-CM

## 2022-03-13 DIAGNOSIS — K436 Other and unspecified ventral hernia with obstruction, without gangrene: Secondary | ICD-10-CM | POA: Insufficient documentation

## 2022-03-13 HISTORY — PX: LOWER EXTREMITY ANGIOGRAPHY: CATH118251

## 2022-03-13 LAB — CREATININE, SERUM
Creatinine, Ser: 1.16 mg/dL — ABNORMAL HIGH (ref 0.44–1.00)
GFR, Estimated: 53 mL/min — ABNORMAL LOW (ref >60)

## 2022-03-13 LAB — BUN: BUN: 12 mg/dL (ref 8–23)

## 2022-03-13 SURGERY — LOWER EXTREMITY ANGIOGRAPHY
Anesthesia: Moderate Sedation | Site: Leg Lower | Laterality: Right

## 2022-03-13 MED ORDER — HEPARIN SODIUM (PORCINE) 1000 UNIT/ML IJ SOLN
INTRAMUSCULAR | Status: AC
  Filled 2022-03-13: qty 10

## 2022-03-13 MED ORDER — METHYLPREDNISOLONE SODIUM SUCC 125 MG IJ SOLR: 125.0000 mg | Freq: Once | INTRAMUSCULAR | Status: DC | PRN

## 2022-03-13 MED ORDER — CEFAZOLIN SODIUM-DEXTROSE 2-4 GM/100ML-% IV SOLN
INTRAVENOUS | Status: AC
  Filled 2022-03-13: qty 100

## 2022-03-13 MED ORDER — DIPHENHYDRAMINE HCL 50 MG/ML IJ SOLN: 50.0000 mg | Freq: Once | INTRAMUSCULAR | Status: DC | PRN

## 2022-03-13 MED ORDER — CEFAZOLIN SODIUM-DEXTROSE 1-4 GM/50ML-% IV SOLN
INTRAVENOUS | Status: DC | PRN
  Administered 2022-03-13: 2 g via INTRAVENOUS

## 2022-03-13 MED ORDER — CEFAZOLIN SODIUM-DEXTROSE 2-4 GM/100ML-% IV SOLN: 2.0000 g | INTRAVENOUS | Status: DC

## 2022-03-13 MED ORDER — ALTEPLASE 2 MG IJ SOLR
INTRAMUSCULAR | Status: AC
  Filled 2022-03-13: qty 10

## 2022-03-13 MED ORDER — FENTANYL CITRATE (PF) 100 MCG/2ML IJ SOLN
INTRAMUSCULAR | Status: DC | PRN
  Administered 2022-03-13: 50 ug via INTRAVENOUS
  Administered 2022-03-13: 25 ug via INTRAVENOUS
  Administered 2022-03-13: 50 ug via INTRAVENOUS
  Administered 2022-03-13: 25 ug via INTRAVENOUS

## 2022-03-13 MED ORDER — HEPARIN SODIUM (PORCINE) 1000 UNIT/ML IJ SOLN
INTRAMUSCULAR | Status: DC | PRN
  Administered 2022-03-13: 5000 [IU] via INTRAVENOUS

## 2022-03-13 MED ORDER — FENTANYL CITRATE PF 50 MCG/ML IJ SOSY
PREFILLED_SYRINGE | INTRAMUSCULAR | Status: AC
  Filled 2022-03-13: qty 1

## 2022-03-13 MED ORDER — HYDROMORPHONE HCL 1 MG/ML IJ SOLN: 1.0000 mg | Freq: Once | INTRAMUSCULAR | Status: DC | PRN

## 2022-03-13 MED ORDER — SODIUM CHLORIDE 0.9 % IV SOLN
INTRAVENOUS | Status: DC
  Administered 2022-03-13: 1000 mL via INTRAVENOUS

## 2022-03-13 MED ORDER — FAMOTIDINE 20 MG PO TABS: 40.0000 mg | ORAL_TABLET | Freq: Once | ORAL | Status: DC | PRN

## 2022-03-13 MED ORDER — MIDAZOLAM HCL 5 MG/5ML IJ SOLN
INTRAMUSCULAR | Status: AC
  Filled 2022-03-13: qty 5

## 2022-03-13 MED ORDER — MIDAZOLAM HCL 2 MG/2ML IJ SOLN
INTRAMUSCULAR | Status: AC
  Filled 2022-03-13: qty 2

## 2022-03-13 MED ORDER — MIDAZOLAM HCL 2 MG/2ML IJ SOLN
INTRAMUSCULAR | Status: DC | PRN
  Administered 2022-03-13 (×2): 2 mg via INTRAVENOUS
  Administered 2022-03-13 (×2): .5 mg via INTRAVENOUS

## 2022-03-13 MED ORDER — ALTEPLASE 1 MG/ML SYRINGE FOR VASCULAR PROCEDURE
INTRAMUSCULAR | Status: DC | PRN
  Administered 2022-03-13: 10 mg

## 2022-03-13 MED ORDER — MIDAZOLAM HCL 2 MG/ML PO SYRP: 8.0000 mg | ORAL_SOLUTION | Freq: Once | ORAL | Status: DC | PRN

## 2022-03-13 MED ORDER — IODIXANOL 320 MG/ML IV SOLN
INTRAVENOUS | Status: DC | PRN
  Administered 2022-03-13: 30 mL via INTRA_ARTERIAL

## 2022-03-13 MED ORDER — FENTANYL CITRATE (PF) 100 MCG/2ML IJ SOLN
INTRAMUSCULAR | Status: AC
  Filled 2022-03-13: qty 2

## 2022-03-13 MED ORDER — ONDANSETRON HCL 4 MG/2ML IJ SOLN: 4.0000 mg | Freq: Four times a day (QID) | INTRAMUSCULAR | Status: DC | PRN

## 2022-03-13 SURGICAL SUPPLY — 24 items
BALLN ULTRVRSE 7X60X75C (BALLOONS) ×2
BALLN ULTRVRSE 8X60X75C (BALLOONS) ×2
BALLOON ULTRVRSE 7X60X75C (BALLOONS) IMPLANT
BALLOON ULTRVRSE 8X60X75C (BALLOONS) IMPLANT
CANISTER PENUMBRA ENGINE (MISCELLANEOUS) ×1 IMPLANT
CANNULA 5F STIFF (CANNULA) ×1 IMPLANT
CATH ANGIO 5F PIGTAIL 65CM (CATHETERS) ×1 IMPLANT
CATH BEACON 5 .035 40 KMP TP (CATHETERS) IMPLANT
CATH BEACON 5 .038 40 KMP TP (CATHETERS) ×2
CATH INDIGO 7D KIT (CATHETERS) ×1 IMPLANT
CATH TEMPO 5F RIM 65CM (CATHETERS) ×1 IMPLANT
CATH VS15FR (CATHETERS) ×1 IMPLANT
COVER PROBE U/S 5X48 (MISCELLANEOUS) ×2 IMPLANT
DEVICE STARCLOSE SE CLOSURE (Vascular Products) ×2 IMPLANT
DEVICE TORQUE .025-.038 (MISCELLANEOUS) ×1 IMPLANT
GLIDEWIRE ADV .035X180CM (WIRE) ×1 IMPLANT
GLIDEWIRE STIFF .35X180X3 HYDR (WIRE) ×1 IMPLANT
KIT ENCORE 26 ADVANTAGE (KITS) ×2 IMPLANT
PACK ANGIOGRAPHY (CUSTOM PROCEDURE TRAY) ×2 IMPLANT
SHEATH BRITE TIP 5FRX11 (SHEATH) ×1 IMPLANT
SHEATH BRITE TIP 7FRX11 (SHEATH) ×1 IMPLANT
SYR MEDRAD MARK 7 150ML (SYRINGE) ×1 IMPLANT
TUBING CONTRAST HIGH PRESS 72 (TUBING) ×1 IMPLANT
WIRE GUIDERIGHT .035X150 (WIRE) ×1 IMPLANT

## 2022-03-13 NOTE — Progress Notes (Signed)
Subjective:    Patient ID: Carla [name], female    DOB: May 04, 1961, 61 y.o.   MRN: [id number] No chief complaint on file.   Carla [name] is a longstanding patient that recently underwent right lower extremity angiogram on 02/22/2022.  This angiogram did not result in any intervention as she was mostly patent.  She returns today noting that she has been having worsening pain in her right lower extremity mainly from her hip but also noting that she has been having worse pain in her knee area, radiating from the calf to the toes.  Today her ABIs 0.46 on the right with a TBI of 0.29, prior to intervention her ABI was 0.63 with TBI 0.61.  The patient has monophasic waveforms with severely dampened toe waveforms.  The left ABIs relatively consistent at 0.74 but the TBI is reduced to 0.42.   Review of Systems  Musculoskeletal:  Positive for myalgias.  All other systems reviewed and are negative.     Objective:   Physical Exam Vitals reviewed.  HENT:     Head: Normocephalic.  Cardiovascular:     Rate and Rhythm: Normal rate.     Pulses:          Dorsalis pedis pulses are detected w/ Doppler on the right side and detected w/ Doppler on the left side.       Posterior tibial pulses are detected w/ Doppler on the right side and detected w/ Doppler on the left side.  Pulmonary:     Effort: Pulmonary effort is normal.  Skin:    General: Skin is warm and dry.  Neurological:     Mental Status: She is alert and oriented to person, place, and time.  Psychiatric:        Mood and Affect: Mood normal.        Behavior: Behavior normal.        Thought Content: Thought content normal.        Judgment: Judgment normal.    There were no vitals taken for this visit.  Past Medical History:  Diagnosis Date   Abdominal aortic atherosclerosis ([place])    a. 05/2017 CTA abd/pelvis: significant atherosclerotic dzs of the infrarenal abd Ao w/ some mural thrombus. No aneurysm or dissection.   Acute focal ischemia  of small intestine (HCC)    Acute right-sided low back pain with right-sided sciatica 06/13/2017   AKI (acute kidney injury) ([place]) 07/29/2017   Anginal pain ([place])    a. 08/2012 Lexiscan MV: EF 54%, non ischemia/infarct.   Anxiety and depression    Baker's cyst of knee, right    a. 07/2016 U/S: 4.1 x 1.4 x 2.9 cystic structure in R poplitetal fossa.   Bell's palsy    Borderline diabetes    CHF (congestive heart failure) (HCC)    Chronic anticoagulation    apixaban   Chronic idiopathic constipation    Chronic mesenteric ischemia (HCC)    Chronic pain    COPD (chronic obstructive pulmonary disease) (HCC)    Diastolic dysfunction    a. 05/2017 Echo: Ef 60-65%, no rwma, Gr1 DD, no source of cardiac emboli.   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 05/2017 CTA Abd/pelvis: apparent thrombus or embolus in prox SMA (70-90%); b. 05/2017 catheter directed tPA, mechanical thrombectomy, and stenting of the SMA.   Essential hypertension with goal blood pressure less than 140/90 01/19/2016   Gastroesophageal reflux disease 01/19/2016   GERD (gastroesophageal  reflux disease)    H/O colectomy    History of 2019 novel coronavirus disease (COVID-19) 11/14/2019   History of kidney stones    Hyperlipidemia    Hypertension    Morbid obesity with BMI of 40.0-44.9, adult (HCC)    Occlusive mesenteric ischemia (HCC) 06/19/2017   Osteoarthritis    PAD (peripheral artery disease) (HCC)    PAF (paroxysmal atrial fibrillation) (HCC)    Palpitations    Peripheral edema    Personal history of other malignant neoplasm of skin 06/01/2016   Pneumonia    Pre-diabetes    Primary osteoarthritis of both knees 06/13/2017   SBO (small bowel obstruction) ([place]) 08/06/2017   Skin cancer of nose    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HCC)    Vitamin D deficiency     Social History   Socioeconomic History   Marital status: Significant Other    Spouse name: Merchant navy officer    Number of  children: Not on file   Years of education: Not on file   Highest education level: Not on file  Occupational History   Occupation: Unemployed  Tobacco Use   Smoking status: Every Day    Packs/day: 1.00    Types: Cigarettes   Smokeless tobacco: Never  Vaping Use   Vaping Use: Never used  Substance and Sexual Activity   Alcohol use: No   Drug use: No   Sexual activity: Not on file  Other Topics Concern   Not on file  Social History Narrative   Lives in [place] with boyfriend.  Does not routinely exercise.   Social Determinants of Health   Financial Resource Strain: Not on file  Food Insecurity: Not on file  Transportation Needs: Not on file  Physical Activity: Not on file  Stress: Not on file  Social Connections: Not on file  Intimate Partner Violence: Not on file    Past Surgical History:  Procedure Laterality Date   AMPUTATION TOE Left 12/09/2019   Procedure: AMPUTATION TOE MPJ left;  Surgeon: Caroline More, DPM;  Location: [facility];  Service: Podiatry;  Laterality: Left;   APPENDECTOMY     APPLICATION OF WOUND VAC  12/01/2021   Procedure: APPLICATION OF WOUND VAC;  Surgeon: Algernon Huxley, MD;  Location: [facility];  Service: Vascular;;   CHOLECYSTECTOMY     COLON SURGERY     EMBOLECTOMY OR THROMBECTOMY, WITH OR WITHOUT CATHETER; FEMOROPOPLITEAL, AORTOILIAC ARTERY, BY LEG INCISION N/A 06/21/2020   Left - Decomp Fasciotomy Leg; Ant &/Or Lat Compart Only; Midline - Angiography, Visceral, Selective Or Supraselective (With Or Without Flush Aortogram); Left - Revascularize, Endovasc, Open/Percut, Iliac Artery, Unilat, Initial Vessel; W/Translum Stent, W/Angioplasty; Right - Revascularize, Endovasc, Open/Percut, Iliac Artery, Ea Add`L Ipsilateral; W/Translumin Stent, W/Angioplasty; Location [facility];   ENDARTERECTOMY FEMORAL Left 12/01/2021   Procedure: ENDARTERECTOMY FEMORAL;  Surgeon: Algernon Huxley, MD;  Location: [facility];  Service: Vascular;  Laterality: Left;   ENDOVASCULAR REPAIR/STENT  GRAFT Left 12/01/2021   Procedure: ENDOVASCULAR REPAIR/STENT GRAFT;  Surgeon: Algernon Huxley, MD;  Location: [place] [facility];  Service: Cardiovascular;  Laterality: Left;   LAPAROTOMY N/A 08/08/2017   Procedure: EXPLORATORY LAPAROTOMY POSSIBLE BOWEL RESECTION;  Surgeon: Jules Husbands, MD;  Location: [facility];  Service: General;  Laterality: N/A;   LEFT HEART CATH AND CORONARY ANGIOGRAPHY N/A 05/17/2021   Procedure: LEFT HEART CATH AND CORONARY ANGIOGRAPHY;  Surgeon: Yolonda Kida, MD;  Location: [place] [facility];  Service: Cardiovascular;  Laterality: N/A;   LOWER  EXTREMITY ANGIOGRAPHY Left 11/17/2019   Procedure: LOWER EXTREMITY ANGIOGRAPHY;  Surgeon: Algernon Huxley, MD;  Location: [place] [facility];  Service: Cardiovascular;  Laterality: Left;   LOWER EXTREMITY ANGIOGRAPHY Right 12/13/2020   Procedure: LOWER EXTREMITY ANGIOGRAPHY;  Surgeon: Algernon Huxley, MD;  Location: [place] [facility];  Service: Cardiovascular;  Laterality: Right;   LOWER EXTREMITY ANGIOGRAPHY Right 01/10/2021   Procedure: LOWER EXTREMITY ANGIOGRAPHY;  Surgeon: Algernon Huxley, MD;  Location: [place] [facility];  Service: Cardiovascular;  Laterality: Right;   LOWER EXTREMITY ANGIOGRAPHY Right 01/24/2021   Procedure: LOWER EXTREMITY ANGIOGRAPHY;  Surgeon: Algernon Huxley, MD;  Location: [place] [facility];  Service: Cardiovascular;  Laterality: Right;   LOWER EXTREMITY ANGIOGRAPHY Bilateral 06/28/2021   Procedure: Lower Extremity Angiography;  Surgeon: Algernon Huxley, MD;  Location: [place] [facility];  Service: Cardiovascular;  Laterality: Bilateral;   LOWER EXTREMITY ANGIOGRAPHY Right 11/28/2021   Procedure: LOWER EXTREMITY ANGIOGRAPHY;  Surgeon: Algernon Huxley, MD;  Location: [place] [facility];  Service: Cardiovascular;  Laterality: Right;   LOWER EXTREMITY ANGIOGRAPHY Left 11/30/2021   Procedure: Lower Extremity Angiography;  Surgeon: Algernon Huxley, MD;  Location: [place] [facility];  Service: Cardiovascular;   Laterality: Left;   LOWER EXTREMITY ANGIOGRAPHY Right 02/22/2022   Procedure: Lower Extremity Angiography;  Surgeon: Algernon Huxley, MD;  Location: [place] [facility];  Service: Cardiovascular;  Laterality: Right;   LOWER EXTREMITY INTERVENTION N/A 11/19/2019   Procedure: LOWER EXTREMITY INTERVENTION;  Surgeon: Algernon Huxley, MD;  Location: [place] [facility];  Service: Cardiovascular;  Laterality: N/A;   LOWER EXTREMITY INTERVENTION Bilateral 06/29/2021   Procedure: LOWER EXTREMITY INTERVENTION;  Surgeon: Algernon Huxley, MD;  Location: [place] [facility];  Service: Cardiovascular;  Laterality: Bilateral;   TEE WITHOUT CARDIOVERSION N/A 06/22/2017   Procedure: TRANSESOPHAGEAL ECHOCARDIOGRAM (TEE);  Surgeon: Wellington Hampshire, MD;  Location: [facility];  Service: Cardiovascular;  Laterality: N/A;   TOTAL KNEE ARTHROPLASTY Right 07/11/2018   Procedure: TOTAL KNEE ARTHROPLASTY;  Surgeon: Corky Mull, MD;  Location: [facility];  Service: Orthopedics;  Laterality: Right;   VAGINAL HYSTERECTOMY     VISCERAL ARTERY INTERVENTION N/A 06/20/2017   Procedure: Visceral Artery Intervention, possible aortic thrombectomy;  Surgeon: Algernon Huxley, MD;  Location: [place] [facility];  Service: Cardiovascular;  Laterality: N/A;   VISCERAL ARTERY INTERVENTION N/A 01/28/2018   Procedure: VISCERAL ARTERY INTERVENTION;  Surgeon: Algernon Huxley, MD;  Location: [place] [facility];  Service: Cardiovascular;  Laterality: N/A;    Family History  Problem Relation Age of Onset   Hypertension Mother    Heart disease Mother    Heart attack Mother    Breast cancer Mother    Hypertension Father    Breast cancer Paternal Aunt     Allergies  Allergen Reactions   Gabapentin     Tremors    Bactrim [Sulfamethoxazole-Trimethoprim] Itching       Latest Ref Rng & Units 02/03/2022    5:19 PM 01/18/2022    4:46 PM 12/01/2021    7:13 PM  CBC  WBC 4.0 - 10.5 K/uL 10.3   7.3   11.8    Hemoglobin 12.0 - 15.0 g/dL 12.9   11.6    10.4    Hematocrit 36.0 - 46.0 % 42.0   37.5   33.7    Platelets 150 - 400 K/uL 289   375   214  CMP     Component Value Date/Time   NA 137 02/04/2022 0732   NA 140 02/12/2015 1137   K 3.5 02/04/2022 0732   K 2.9 (L) 02/12/2015 1137   CL 104 02/04/2022 0732   CL 107 02/12/2015 1137   CO2 21 (L) 02/04/2022 0732   CO2 27 02/12/2015 1137   GLUCOSE 189 (H) 02/04/2022 0732   GLUCOSE 120 (H) 02/12/2015 1137   BUN 17 02/22/2022 1018   BUN 15 02/12/2015 1137   CREATININE 1.10 (H) 02/22/2022 1018   CREATININE 0.78 02/12/2015 1137   CALCIUM 8.7 (L) 02/04/2022 0732   CALCIUM 9.0 02/12/2015 1137   PROT 7.5 01/18/2022 1646   PROT 7.0 09/14/2020 1139   PROT 7.1 01/19/2015 1112   ALBUMIN 3.7 01/18/2022 1646   ALBUMIN 3.9 09/14/2020 1139   ALBUMIN 3.8 01/19/2015 1112   AST 19 01/18/2022 1646   AST 17 01/19/2015 1112   ALT 17 01/18/2022 1646   ALT 11 (L) 01/19/2015 1112   ALKPHOS 93 01/18/2022 1646   ALKPHOS 84 01/19/2015 1112   BILITOT 0.7 01/18/2022 1646   BILITOT <0.2 09/14/2020 1139   BILITOT 0.5 01/19/2015 1112   GFRNONAA 57 (L) 02/22/2022 1018   GFRNONAA >60 02/12/2015 1137   GFRAA >60 11/23/2019 1330   GFRAA >60 02/12/2015 1137     VAS [place] ABI WITH/WO TBI  Result Date: 03/09/2022  [place] STUDY Patient Name:  NIMSI MALES  Date of Exam:   03/07/2022 Medical Rec #: [id number]      Accession #:    [id number] Date of Birth: 04/08/1961       Patient Gender: F Patient Age:   78 years Exam Location:  [facility] Procedure:      VAS [place] ABI WITH/WO TBI Referring Phys: Arna Medici Caleigha Zale --------------------------------------------------------------------------------  Indications: LLE pain.  Vascular Interventions: 12/13/20: right cia,eia thrombectomy                         01/10/21: rt thrombectomy new bilat kissing stents in                         cia's, addtl rt eia stent;                         01/24/21: repeat thrombectomy right;                          06/29/21: Right CIA/EIA thrombectomy with bilateral CIA                         PTAs;                         11/28/21: Left CIA/EIA thrombectomy/PTA;                         12/01/2021: Left CFA,PFA and SFA Endartectomies and                         patch. Performing Technologist: Blondell Reveal RT, RDMS, RVT  Examination Guidelines: A complete evaluation includes at minimum, Doppler waveform signals and systolic blood pressure reading at the level of bilateral brachial, anterior tibial, and posterior tibial arteries, when vessel segments are accessible. Bilateral testing is considered  an integral part of a complete examination. Photoelectric Plethysmograph (PPG) waveforms and toe systolic pressure readings are included as required and additional duplex testing as needed. Limited examinations for reoccurring indications may be performed as noted.  ABI Findings: +---------+------------------+-----+------------+--------+ Right    Rt Pressure (mmHg)IndexWaveform    Comment  +---------+------------------+-----+------------+--------+ Brachial 136                                         +---------+------------------+-----+------------+--------+ ATA      64                0.46 monophasic           +---------+------------------+-----+------------+--------+ PTA                             not detected         +---------+------------------+-----+------------+--------+ PERO     62                0.45 monophasic           +---------+------------------+-----+------------+--------+ Great Toe40                0.29 Abnormal             +---------+------------------+-----+------------+--------+ +---------+------------------+-----+----------+-------+ Left     Lt Pressure (mmHg)IndexWaveform  Comment +---------+------------------+-----+----------+-------+ Brachial 139                                      +---------+------------------+-----+----------+-------+ ATA      103                0.74 monophasic        +---------+------------------+-----+----------+-------+ PTA      67                0.48 monophasic        +---------+------------------+-----+----------+-------+ Great Toe58                0.42 Normal            +---------+------------------+-----+----------+-------+ +-------+-----------+-----------+------------+------------+ ABI/TBIToday's ABIToday's TBIPrevious ABIPrevious TBI +-------+-----------+-----------+------------+------------+ Right  0.46       0.29       0.63        0.61         +-------+-----------+-----------+------------+------------+ Left   0.74       0.42       0.75        0.75         +-------+-----------+-----------+------------+------------+ Right ABIs appear decreased compared to prior study on 02/15/22. Left ABIs appear essentially unchanged.  Summary: Right: Resting right ankle-brachial index indicates severe right lower extremity arterial disease. The right toe-brachial index is abnormal. Left: Resting left ankle-brachial index indicates moderate left lower extremity arterial disease. The left toe-brachial index is abnormal. *See table(s) above for measurements and observations.  Electronically signed by [name] MD on 03/09/2022 at 12:24:25 PM.    Final    VAS [place] ABI WITH/WO TBI  Result Date: 02/17/2022  LOWER EXTREMITY DOPPLER STUDY Patient Name:  ARANDA BIHM  Date of Exam:   02/15/2022 Medical Rec #: [id number]      Accession #:    [id number] Date of Birth: [birth year]       Patient Gender: F Patient Age:   51 years  Exam Location:  [facility] Procedure:      VAS [place] ABI WITH/WO TBI Referring Phys: [name] --------------------------------------------------------------------------------  Indications: Peripheral artery disease, and LLE pain.  Vascular Interventions: 12/13/20: right cia,eia thrombectomy                         01/10/21: rt thrombectomy new bilat kissing stents in                         cia's, addtl rt eia  stent;                         01/24/21: repeat thrombectomy right;                         06/29/21: Right CIA/EIA thrombectomy with bilateral CIA                         PTAs;                         11/28/21: Left CIA/EIA thrombectomy/PTA;                          12/01/2021: Left CFA,PFA and SFA Endartectomies and                         patch. Comparison Study: 11/29/2021 Performing Technologist: Almira Coaster RVS  Examination Guidelines: A complete evaluation includes at minimum, Doppler waveform signals and systolic blood pressure reading at the level of bilateral brachial, anterior tibial, and posterior tibial arteries, when vessel segments are accessible. Bilateral testing is considered an integral part of a complete examination. Photoelectric Plethysmograph (PPG) waveforms and toe systolic pressure readings are included as required and additional duplex testing as needed. Limited examinations for reoccurring indications may be performed as noted.  ABI Findings: +---------+------------------+-----+----------+--------+ Right    Rt Pressure (mmHg)IndexWaveform  Comment  +---------+------------------+-----+----------+--------+ Brachial 128                                       +---------+------------------+-----+----------+--------+ ATA      82                0.63 monophasic         +---------+------------------+-----+----------+--------+ PTA      78                0.60 monophasic         +---------+------------------+-----+----------+--------+ Great Toe79                0.61 Abnormal           +---------+------------------+-----+----------+--------+ +---------+------------------+-----+--------+-------+ Left     Lt Pressure (mmHg)IndexWaveformComment +---------+------------------+-----+--------+-------+ Brachial 130                                    +---------+------------------+-----+--------+-------+ ATA      98                0.75 biphasic         +---------+------------------+-----+--------+-------+ PTA      102  0.78 biphasic        +---------+------------------+-----+--------+-------+ Great Toe97                0.75 Normal          +---------+------------------+-----+--------+-------+ +-------+-----------+-----------+------------+------------+ ABI/TBIToday's ABIToday's TBIPrevious ABIPrevious TBI +-------+-----------+-----------+------------+------------+ Right  .63        .61        .90         .73          +-------+-----------+-----------+------------+------------+ Left   .78        .75        NA          NA           +-------+-----------+-----------+------------+------------+ Right ABIs and TBIs appear decreased compared to prior study on 11/29/2021. Left ABIs and TBIs appear increased compared to prior study on 11/29/2021.  Summary: Right: Resting right ankle-brachial index indicates moderate right lower extremity arterial disease. The right toe-brachial index is abnormal. Left: Resting left ankle-brachial index indicates moderate left lower extremity arterial disease. The left toe-brachial index is normal.  *See table(s) above for measurements and observations.  Electronically signed by [name] MD on 02/17/2022 at 12:05:49 PM.    Final        Assessment & Plan:   1. Rest pain of lower extremity due to atherosclerosis [facility]) Recommend:  The patient has evidence of severe atherosclerotic changes of both lower extremities with rest pain that is associated with preulcerative changes and impending tissue loss of the right foot.  This represents a limb threatening ischemia and places the patient at the risk for right lower extremity limb loss.  Patient should undergo angiography of the right lower extremity with the hope for intervention for limb salvage.  The risks and benefits as well as the alternative therapies was discussed in detail with the patient.  All questions were answered.  Patient agrees to  proceed with right lower extremity angiography.  The patient will follow up with me in the office after the procedure.   Pending ultrasound results, the patient's hip pain continues she will be advised to follow-up with her orthopedic surgeon for evaluation of sciatic nerve pain.  2. Tobacco use disorder The patient states that she has stopped smoking within the last month  3. Essential hypertension with goal blood pressure less than 140/90 Continue antihypertensive medications as already ordered, these medications have been reviewed and there are no changes at this time.   4. Hyperlipidemia, unspecified hyperlipidemia type Continue statin as ordered and reviewed, no changes at this time    Current Outpatient Medications on File Prior to Visit  Medication Sig Dispense Refill   apixaban (ELIQUIS) 5 MG TABS tablet Take 1 tablet (5 mg total) by mouth 2 (two) times daily. 60 tablet 11   aspirin EC 81 MG tablet Take 81 mg by mouth daily. Swallow whole.     atorvastatin (LIPITOR) 80 MG tablet Take 80 mg by mouth at bedtime.     citalopram (CELEXA) 40 MG tablet Take 1 tablet (40 mg total) by mouth daily. 30 tablet 2   COMBIVENT RESPIMAT 20-100 MCG/ACT AERS respimat Inhale 1 puff into the lungs daily as needed for wheezing or shortness of breath.     ferrous sulfate 325 (65 FE) MG tablet Take 325 mg by mouth daily.     isosorbide mononitrate (IMDUR) 60 MG 24 hr tablet Take 60 mg by mouth daily.     metoprolol tartrate (LOPRESSOR) 50 MG tablet  Take 1 tablet (50 mg total) by mouth 2 (two) times daily. 30 tablet 0   nitroGLYCERIN (NITROSTAT) 0.4 MG SL tablet Place 0.4 mg under the tongue as directed.     omeprazole (PRILOSEC) 20 MG capsule Take 20 mg by mouth 2 (two) times daily before a meal.     pregabalin (LYRICA) 100 MG capsule Take 1 capsule (100 mg total) by mouth 3 (three) times daily. 90 capsule 2   No current facility-administered medications on file prior to visit.    There are no  Patient Instructions on file for this visit. No follow-ups on file.   Kris Hartmann, NP

## 2022-03-13 NOTE — Telephone Encounter (Signed)
Patient walked in asking for Carla [name] and wants a new prescription for Tramadol.  Please advise

## 2022-03-13 NOTE — Op Note (Signed)
[facility]  Percutaneous Study/Intervention Procedural Note   Date of Surgery: 03/13/2022  Surgeon(s):Emara Lichter    Assistants:none  Pre-operative Diagnosis: PAD with rest pain right leg  Post-operative diagnosis:  Same  Procedure(s) Performed:             1.  Ultrasound guidance for vascular access bilateral femoral arteries             2.  Catheter placement into aorta from bilateral femoral approach             3.  Aortogram and selective bilateral iliofemoral arteriograms             4.  Percutaneous transluminal angioplasty of left common iliac artery with 7 mm diameter angioplasty balloon             5.  Mechanical thrombectomy using the penumbra CAT 7D catheter to the right common and external iliac arteries  6.  Percutaneous transluminal angioplasty of the right common and external iliac arteries with 8 mm diameter angioplasty balloon             7.  StarClose closure device bilateral femoral arteries  EBL: 150 cc  Contrast: 30 cc  Fluoro Time: 10.4 minutes  Moderate Conscious Sedation Time: approximately 56 minutes using 5 mg of Versed and 150 mcg of Fentanyl              Indications:  Patient is a 61 y.o.female with recurrent rest pain of the right leg. The patient has noninvasive study showing reduction in the ABIs worse on the right. The patient is brought in for angiography for further evaluation and potential treatment.  Due to the limb threatening nature of the situation, angiogram was performed for attempted limb salvage. The patient is aware that if the procedure fails, amputation would be expected.  The patient also understands that even with successful revascularization, amputation may still be required due to the severity of the situation.  Risks and benefits are discussed and informed consent is obtained.   Procedure:  The patient was identified and appropriate procedural time out was performed.  The patient was then placed supine on the  table and prepped and draped in the usual sterile fashion. Moderate conscious sedation was administered during a face to face encounter with the patient throughout the procedure with my supervision of the RN administering medicines and monitoring the patient's vital signs, pulse oximetry, telemetry and mental status throughout from the start of the procedure until the patient was taken to the recovery room. Ultrasound was used to evaluate the left common femoral artery.  It was patent .  A digital ultrasound image was acquired.  A Seldinger needle was used to access the left common femoral artery under direct ultrasound guidance and a permanent image was performed.  A 0.035 J wire was advanced without resistance and a 5Fr sheath was placed.  Pigtail catheter was placed into the aorta and an AP aortogram was performed. This demonstrated normal renal arteries.  There was an Endologix stent graft and multiple right iliac stents and left iliac stents in place but flush occlusion of the right common iliac artery and occlusion down through the right external iliac artery with reconstitution of the right common femoral artery with patency of the proximal portions of the SFA and profunda femoris arteries and common femoral artery seen.  On the left, there appeared to be some narrowing of the left common iliac artery but reasonably good flow and  no stenosis in the left external iliac artery, common femoral artery, or proximal portions of the SFA or popliteal arteries. I then attempted to cross the aortic bifurcation and was unable to get any catheter across the right common iliac artery occlusion including a V S1 and a rim catheter.  I then accessed the right femoral artery under direct ultrasound guidance. This was done with a micropuncture needle under direct ultrasound guidance without difficulty and a permanent image was recorded.  A micropuncture wire and sheath were placed we then upsized to a 7 [place] sheath.  The  patient was systemically heparinized.  It was felt that it was in the patient's best interest to proceed with intervention after these images to avoid a second procedure and a larger amount of contrast and fluoroscopy based off of the findings from the initial angiogram.  Using a Kumpe catheter and advantage wire was able to easily cross the occlusion on the right side.  On the left side, I selected a 7 mm diameter by 6 cm length angioplasty balloon and angioplasty of the left common iliac artery up to 8 atm for 1 minute.  This was done both to treat the mild narrowing at this site as well as to protect the left side from harm we will perform thrombectomy on the right. The right external and common iliac arteries were then treated with mechanical thrombectomy with the penumbra CAT 7D catheter after instilling 10 mg of tPA in the right iliac arteries with a Kumpe catheter.  This did well for several minutes before thrombectomy was performed.  Mechanical thrombectomy was performed in the right external and common iliac arteries and chunks of thrombus were removed but there remained what appeared to be a high-grade stenosis in the right common iliac artery.  I then selected an 8 mm diameter by 6 cm length angioplasty balloon on the right side, and kissing balloon inflations were performed in bilateral common iliac arteries up into the distal portion of the aortic stent graft in the aorta.  The balloon was then pulled back down on the right down to the proximal external iliac artery.  The inflations on the right were to 10 atm and we kept the 7 mm balloon in the left common iliac artery inflated to 10 atm as well.  Completion imaging showed brisk flow through the stent graft with patency through the common femoral arteries and the proximal portions of the superficial femoral and profunda femoris arteries with less than 10% residual stenosis in the iliac vessels that were treated. I elected to terminate the procedure.  The sheath was removed and StarClose closure device was deployed in the right femoral artery with excellent hemostatic result.  StarClose was also deployed in the left femoral artery with excellent hemostatic result.  The patient was taken to the recovery room in stable condition having tolerated the procedure well.  Findings:               Aortogram and iliofemoral angiograms bilaterally:  This demonstrated normal renal arteries.  There was an Endologix stent graft and multiple right iliac stents and left iliac stents in place but flush occlusion of the right common iliac artery and occlusion down through the right external iliac artery with reconstitution of the right common femoral artery with patency of the proximal portions of the SFA and profunda femoris arteries and common femoral artery seen.  On the left, there appeared to be some narrowing of the left common iliac artery but  reasonably good flow and no stenosis in the left external iliac artery, common femoral artery, or proximal portions of the SFA or popliteal arteries.   Disposition: Patient was taken to the recovery room in stable condition having tolerated the procedure well.  Complications: None  [name] 03/13/2022 12:15 PM   This note was created with Dragon Medical transcription system. Any errors in dictation are purely unintentional.

## 2022-03-14 ENCOUNTER — Encounter: Payer: Self-pay | Admitting: Vascular Surgery

## 2022-03-14 ENCOUNTER — Telehealth (INDEPENDENT_AMBULATORY_CARE_PROVIDER_SITE_OTHER): Payer: Self-pay

## 2022-03-14 NOTE — Telephone Encounter (Signed)
Tramadol sent. 

## 2022-03-14 NOTE — Telephone Encounter (Signed)
Patient left a voicemail stating that she is having right upper leg heaviness,tiring,and sharp. The patient informed that pain is more intense when moving her leg. The patient had a right le angio on yesterday. I spoke with Dr Lucky Cowboy and recommend that I could call in some tramadol '50mg'$  for pain relief but if the patient pain worsen she should be evaluated at ED. Prescription was left on [employer].Patient was made aware with medical advice

## 2022-03-16 ENCOUNTER — Encounter: Payer: Self-pay | Admitting: Intensive Care

## 2022-03-16 ENCOUNTER — Emergency Department
Admission: EM | Admit: 2022-03-16 | Discharge: 2022-03-16 | Disposition: A | Discharge status: Home / Self Care | Payer: Medicare Other | Attending: Emergency Medicine | Admitting: Emergency Medicine

## 2022-03-16 ENCOUNTER — Other Ambulatory Visit: Payer: Self-pay

## 2022-03-16 DIAGNOSIS — M79604 Pain in right leg: Secondary | ICD-10-CM | POA: Diagnosis present

## 2022-03-16 DIAGNOSIS — I509 Heart failure, unspecified: Secondary | ICD-10-CM | POA: Diagnosis not present

## 2022-03-16 DIAGNOSIS — I2510 Atherosclerotic heart disease of native coronary artery without angina pectoris: Secondary | ICD-10-CM | POA: Diagnosis not present

## 2022-03-16 DIAGNOSIS — Z7901 Long term (current) use of anticoagulants: Secondary | ICD-10-CM | POA: Insufficient documentation

## 2022-03-16 DIAGNOSIS — I739 Peripheral vascular disease, unspecified: Secondary | ICD-10-CM | POA: Insufficient documentation

## 2022-03-16 DIAGNOSIS — I110 Hypertensive heart disease with heart failure: Secondary | ICD-10-CM | POA: Insufficient documentation

## 2022-03-16 MED ORDER — OXYCODONE-ACETAMINOPHEN 5-325 MG PO TABS: 1.0000 | ORAL_TABLET | ORAL | 0 refills | Status: DC | PRN

## 2022-03-16 MED ORDER — OXYCODONE-ACETAMINOPHEN 5-325 MG PO TABS
1.0000 | ORAL_TABLET | Freq: Once | ORAL | Status: AC
  Administered 2022-03-16: 1 via ORAL
  Filled 2022-03-16: qty 1

## 2022-03-16 NOTE — ED Notes (Signed)
Provider Jessup at bedside.

## 2022-03-16 NOTE — ED Provider Notes (Signed)
[facility] Provider Note    Event Date/Time   First MD Initiated Contact with Patient 03/16/22 1556     (approximate)   History   Chief Complaint Post-op Problem   HPI  Carla [name] is a 61 y.o. female with past medical history of hypertension, hyperlipidemia, CAD, CHF, PAD on Eliquis, mesenteric ischemia, and chronic pain syndrome who presents to the ED complaining of leg pain.  Patient reports that she has been dealing with about 2 weeks of throbbing pain in her right thigh moving down towards her foot.  She describes it as a constant pain present at rest but worse when she goes to walk and bear weight on the right leg.  She denies any recent falls or trauma.  3 days ago, she had angiography with thrombectomy to her right common and external iliac arteries, states her pain has been unchanged since then.  She spoke with her vascular surgeons office, was prescribed tramadol without relief and told to return to the ED if symptoms were to persist.  She denies any swelling or redness to the leg, has not had any fevers.  She states that the access sites to both groins seem to be healing well.     Physical Exam   Triage Vital Signs: ED Triage Vitals [03/16/22 1515]  Enc Vitals Group     BP 133/60     Pulse Rate (!) 58     Resp 16     Temp 98.3 F (36.8 C)     Temp Source Oral     SpO2 98 %     Weight 225 lb (102.1 kg)     Height '5\' 2"'$  (1.575 m)     Head Circumference      Peak Flow      Pain Score 10     Pain Loc      Pain Edu?      Excl. in [place]?     Most recent vital signs: Vitals:   03/16/22 1631 03/16/22 1633  BP:  135/70  Pulse: 60   Resp:  18  Temp:    SpO2: 97%     Constitutional: Alert and oriented. Eyes: Conjunctivae are normal. Head: Atraumatic. Nose: No congestion/rhinnorhea. Mouth/Throat: Mucous membranes are moist.  Cardiovascular: Normal rate, regular rhythm. Grossly normal heart sounds.  2+ radial pulses bilaterally.  1+ DP  pulse on left, unable to palpate DP pulse on right.  DP pulse dopplerable on the right.  Delayed cap refill noted to toes bilaterally.  Bilateral groin access sites clean, dry, and intact with no erythema or warmth. Respiratory: Normal respiratory effort.  No retractions. Lungs CTAB. Gastrointestinal: Soft and nontender. No distention. Musculoskeletal: No lower extremity tenderness nor edema.  Neurologic:  Normal speech and language. No gross focal neurologic deficits are appreciated.    ED Results / Procedures / Treatments   Labs (all labs ordered are listed, but only abnormal results are displayed) Labs Reviewed - No data to display   PROCEDURES:  Critical Care performed: No  Procedures   MEDICATIONS ORDERED IN ED: Medications  oxyCODONE-acetaminophen (PERCOCET/ROXICET) 5-325 MG per tablet 1 tablet (1 tablet Oral Given 03/16/22 1628)     IMPRESSION / MDM / ASSESSMENT AND PLAN / ED COURSE  I reviewed the triage vital signs and the nursing notes.  61 y.o. female with past medical history of hypertension, hyperlipidemia, CAD, CHF, PAD on Eliquis, mesenteric ischemia, and chronic pain syndrome who presents to the ED with ongoing right leg pain worse with exertion for the past 2 weeks despite recent angiography and mechanical thrombectomy.  Differential diagnosis includes, but is not limited to, limb ischemia, DVT, cellulitis, peripheral neuropathy, chronic pain syndrome.  Patient well-appearing and in no acute distress, vital signs are unremarkable.  I am unable to palpate a DP pulse on the right, she does have a 1+ DP pulse on the left that appears similar to recent vascular surgery evaluation.  I am able to obtain good Doppler signals in the DP on right side and there are no outward signs of limb ischemia.  No findings to suggest infectious process or DVT.  Plan to discuss with vascular surgery whether patient would benefit from additional angiography  versus outpatient follow-up.  Case discussed with Dr. Delana Meyer of vascular surgery, who agrees that given no signs of acute ischemia, patient be appropriate for outpatient follow-up in the vascular surgery office.  We will prescribe additional pain medication and patient was counseled to return to the ED for new or worsening symptoms.  Patient and family agree with plan.      FINAL CLINICAL IMPRESSION(S) / ED DIAGNOSES   Final diagnoses:  PAD (peripheral artery disease) ([place])     Rx / DC Orders   ED Discharge Orders          Ordered    oxyCODONE-acetaminophen (PERCOCET) 5-325 MG tablet  Every 4 hours PRN        03/16/22 1643             Note:  This document was prepared using Dragon voice recognition software and may include unintentional dictation errors.   Blake Divine, MD 03/16/22 [phone]

## 2022-03-16 NOTE — ED Triage Notes (Signed)
Patient had Right Lower Extremity Angiography performed Monday 03/13/22 by Dr dew. Reports she was hurting on right side before procedure and is still hurting the same after procedure. Was told to come to ER if pain persisted. Denies swelling, chest pain, or sob

## 2022-03-16 NOTE — ED Notes (Signed)
See triage note. Pt reports recent surgery with balloon placement at both hips; c/o pain at R upper thigh and hip area; R dorsalis pedis pulse confirmed by provider Jessup using doppler; pt able to move R foot; R foot equal in warmth and color to L foot; visitor at bedside.

## 2022-03-17 ENCOUNTER — Other Ambulatory Visit: Payer: Self-pay | Admitting: Student

## 2022-03-17 DIAGNOSIS — M7521 Bicipital tendinitis, right shoulder: Secondary | ICD-10-CM

## 2022-03-17 DIAGNOSIS — M7581 Other shoulder lesions, right shoulder: Secondary | ICD-10-CM

## 2022-03-17 DIAGNOSIS — S46011A Strain of muscle(s) and tendon(s) of the rotator cuff of right shoulder, initial encounter: Secondary | ICD-10-CM

## 2022-03-21 ENCOUNTER — Other Ambulatory Visit: Payer: Self-pay

## 2022-03-21 ENCOUNTER — Inpatient Hospital Stay: Payer: Medicare Other

## 2022-03-21 ENCOUNTER — Ambulatory Visit (INDEPENDENT_AMBULATORY_CARE_PROVIDER_SITE_OTHER): Payer: Medicare Other | Admitting: Vascular Surgery

## 2022-03-21 ENCOUNTER — Ambulatory Visit (INDEPENDENT_AMBULATORY_CARE_PROVIDER_SITE_OTHER): Payer: Medicare Other

## 2022-03-21 ENCOUNTER — Encounter (INDEPENDENT_AMBULATORY_CARE_PROVIDER_SITE_OTHER): Payer: Self-pay | Admitting: Vascular Surgery

## 2022-03-21 ENCOUNTER — Inpatient Hospital Stay
Admission: AD | Admit: 2022-03-21 | Discharge: 2022-03-24 | DRG: 357 | Disposition: A | Discharge status: Home / Self Care | Payer: Medicare Other | Source: Ambulatory Visit | Attending: Internal Medicine | Admitting: Internal Medicine

## 2022-03-21 ENCOUNTER — Other Ambulatory Visit (INDEPENDENT_AMBULATORY_CARE_PROVIDER_SITE_OTHER): Payer: Self-pay | Admitting: Vascular Surgery

## 2022-03-21 VITALS — BP 154/101 | HR 94 | Resp 16

## 2022-03-21 DIAGNOSIS — Y712 Prosthetic and other implants, materials and accessory cardiovascular devices associated with adverse incidents: Secondary | ICD-10-CM | POA: Diagnosis present

## 2022-03-21 DIAGNOSIS — Z9862 Peripheral vascular angioplasty status: Secondary | ICD-10-CM

## 2022-03-21 DIAGNOSIS — Z803 Family history of malignant neoplasm of breast: Secondary | ICD-10-CM

## 2022-03-21 DIAGNOSIS — I252 Old myocardial infarction: Secondary | ICD-10-CM

## 2022-03-21 DIAGNOSIS — I480 Paroxysmal atrial fibrillation: Secondary | ICD-10-CM | POA: Diagnosis present

## 2022-03-21 DIAGNOSIS — I739 Peripheral vascular disease, unspecified: Secondary | ICD-10-CM | POA: Diagnosis not present

## 2022-03-21 DIAGNOSIS — I998 Other disorder of circulatory system: Secondary | ICD-10-CM

## 2022-03-21 DIAGNOSIS — I7409 Other arterial embolism and thrombosis of abdominal aorta: Secondary | ICD-10-CM | POA: Diagnosis not present

## 2022-03-21 DIAGNOSIS — T82856A Stenosis of peripheral vascular stent, initial encounter: Secondary | ICD-10-CM | POA: Diagnosis present

## 2022-03-21 DIAGNOSIS — I743 Embolism and thrombosis of arteries of the lower extremities: Secondary | ICD-10-CM | POA: Diagnosis present

## 2022-03-21 DIAGNOSIS — K640 First degree hemorrhoids: Secondary | ICD-10-CM | POA: Diagnosis present

## 2022-03-21 DIAGNOSIS — Z8249 Family history of ischemic heart disease and other diseases of the circulatory system: Secondary | ICD-10-CM

## 2022-03-21 DIAGNOSIS — Z6841 Body Mass Index (BMI) 40.0 and over, adult: Secondary | ICD-10-CM

## 2022-03-21 DIAGNOSIS — M5441 Lumbago with sciatica, right side: Secondary | ICD-10-CM | POA: Diagnosis present

## 2022-03-21 DIAGNOSIS — K439 Ventral hernia without obstruction or gangrene: Secondary | ICD-10-CM | POA: Diagnosis present

## 2022-03-21 DIAGNOSIS — Z85828 Personal history of other malignant neoplasm of skin: Secondary | ICD-10-CM

## 2022-03-21 DIAGNOSIS — M79604 Pain in right leg: Secondary | ICD-10-CM

## 2022-03-21 DIAGNOSIS — Z87442 Personal history of urinary calculi: Secondary | ICD-10-CM | POA: Diagnosis not present

## 2022-03-21 DIAGNOSIS — G8929 Other chronic pain: Secondary | ICD-10-CM | POA: Diagnosis present

## 2022-03-21 DIAGNOSIS — Z8616 Personal history of COVID-19: Secondary | ICD-10-CM | POA: Diagnosis not present

## 2022-03-21 DIAGNOSIS — F419 Anxiety disorder, unspecified: Secondary | ICD-10-CM | POA: Diagnosis present

## 2022-03-21 DIAGNOSIS — Z7982 Long term (current) use of aspirin: Secondary | ICD-10-CM

## 2022-03-21 DIAGNOSIS — R7303 Prediabetes: Secondary | ICD-10-CM

## 2022-03-21 DIAGNOSIS — M79671 Pain in right foot: Secondary | ICD-10-CM | POA: Diagnosis not present

## 2022-03-21 DIAGNOSIS — I70202 Unspecified atherosclerosis of native arteries of extremities, left leg: Secondary | ICD-10-CM | POA: Diagnosis not present

## 2022-03-21 DIAGNOSIS — E785 Hyperlipidemia, unspecified: Secondary | ICD-10-CM | POA: Diagnosis present

## 2022-03-21 DIAGNOSIS — Z79899 Other long term (current) drug therapy: Secondary | ICD-10-CM

## 2022-03-21 DIAGNOSIS — K625 Hemorrhage of anus and rectum: Secondary | ICD-10-CM | POA: Diagnosis present

## 2022-03-21 DIAGNOSIS — Z882 Allergy status to sulfonamides status: Secondary | ICD-10-CM

## 2022-03-21 DIAGNOSIS — K219 Gastro-esophageal reflux disease without esophagitis: Secondary | ICD-10-CM | POA: Diagnosis present

## 2022-03-21 DIAGNOSIS — J449 Chronic obstructive pulmonary disease, unspecified: Secondary | ICD-10-CM | POA: Diagnosis present

## 2022-03-21 DIAGNOSIS — F339 Major depressive disorder, recurrent, unspecified: Secondary | ICD-10-CM | POA: Diagnosis present

## 2022-03-21 DIAGNOSIS — Z87891 Personal history of nicotine dependence: Secondary | ICD-10-CM

## 2022-03-21 DIAGNOSIS — I70221 Atherosclerosis of native arteries of extremities with rest pain, right leg: Secondary | ICD-10-CM | POA: Diagnosis not present

## 2022-03-21 DIAGNOSIS — Z9049 Acquired absence of other specified parts of digestive tract: Secondary | ICD-10-CM

## 2022-03-21 DIAGNOSIS — I110 Hypertensive heart disease with heart failure: Secondary | ICD-10-CM | POA: Diagnosis present

## 2022-03-21 DIAGNOSIS — I5032 Chronic diastolic (congestive) heart failure: Secondary | ICD-10-CM | POA: Diagnosis present

## 2022-03-21 DIAGNOSIS — D6859 Other primary thrombophilia: Secondary | ICD-10-CM | POA: Diagnosis present

## 2022-03-21 DIAGNOSIS — K55069 Acute infarction of intestine, part and extent unspecified: Secondary | ICD-10-CM | POA: Diagnosis not present

## 2022-03-21 DIAGNOSIS — I700 Atherosclerosis of aorta: Secondary | ICD-10-CM | POA: Diagnosis present

## 2022-03-21 DIAGNOSIS — Z7901 Long term (current) use of anticoagulants: Secondary | ICD-10-CM

## 2022-03-21 DIAGNOSIS — Z96651 Presence of right artificial knee joint: Secondary | ICD-10-CM | POA: Diagnosis present

## 2022-03-21 DIAGNOSIS — Z89422 Acquired absence of other left toe(s): Secondary | ICD-10-CM

## 2022-03-21 DIAGNOSIS — Z888 Allergy status to other drugs, medicaments and biological substances status: Secondary | ICD-10-CM

## 2022-03-21 LAB — CBC WITH DIFFERENTIAL/PLATELET
Abs Immature Granulocytes: 0.01 10*3/uL (ref 0.00–0.07)
Basophils Absolute: 0 10*3/uL (ref 0.0–0.1)
Basophils Relative: 1 %
Eosinophils Absolute: 0.1 10*3/uL (ref 0.0–0.5)
Eosinophils Relative: 1 %
HCT: 36.5 % (ref 36.0–46.0)
Hemoglobin: 11.4 g/dL — ABNORMAL LOW (ref 12.0–15.0)
Immature Granulocytes: 0 %
Lymphocytes Relative: 32 %
Lymphs Abs: 2.1 10*3/uL (ref 0.7–4.0)
MCH: 25.6 pg — ABNORMAL LOW (ref 26.0–34.0)
MCHC: 31.2 g/dL (ref 30.0–36.0)
MCV: 82 fL (ref 80.0–100.0)
Monocytes Absolute: 0.5 10*3/uL (ref 0.1–1.0)
Monocytes Relative: 7 %
Neutro Abs: 3.8 10*3/uL (ref 1.7–7.7)
Neutrophils Relative %: 59 %
Platelets: 315 10*3/uL (ref 150–400)
RBC: 4.45 MIL/uL (ref 3.87–5.11)
RDW: 17.1 % — ABNORMAL HIGH (ref 11.5–15.5)
WBC: 6.5 10*3/uL (ref 4.0–10.5)
nRBC: 0 % (ref 0.0–0.2)

## 2022-03-21 LAB — BASIC METABOLIC PANEL
Anion gap: 14 (ref 5–15)
BUN: 11 mg/dL (ref 8–23)
CO2: 23 mmol/L (ref 22–32)
Calcium: 9 mg/dL (ref 8.9–10.3)
Chloride: 101 mmol/L (ref 98–111)
Creatinine, Ser: 0.95 mg/dL (ref 0.44–1.00)
GFR, Estimated: >60 mL/min (ref >60)
Glucose, Bld: 140 mg/dL — ABNORMAL HIGH (ref 70–99)
Potassium: 3.2 mmol/L — ABNORMAL LOW (ref 3.5–5.1)
Sodium: 138 mmol/L (ref 135–145)

## 2022-03-21 LAB — MAGNESIUM: Magnesium: 1.8 mg/dL (ref 1.7–2.4)

## 2022-03-21 MED ORDER — SODIUM CHLORIDE 0.9 % IV SOLN: INTRAVENOUS | Status: DC

## 2022-03-21 MED ORDER — POTASSIUM CHLORIDE CRYS ER 20 MEQ PO TBCR
40.0000 meq | EXTENDED_RELEASE_TABLET | Freq: Once | ORAL | Status: AC
  Administered 2022-03-21: 40 meq via ORAL
  Filled 2022-03-21: qty 2

## 2022-03-21 MED ORDER — IOHEXOL 9 MG/ML PO SOLN
500.0000 mL | Freq: Once | ORAL | Status: DC | PRN
  Administered 2022-03-21: 500 mL via ORAL

## 2022-03-21 MED ORDER — PREGABALIN 50 MG PO CAPS
100.0000 mg | ORAL_CAPSULE | Freq: Three times a day (TID) | ORAL | Status: DC
  Administered 2022-03-21 – 2022-03-24 (×9): 100 mg via ORAL
  Filled 2022-03-21 (×9): qty 2

## 2022-03-21 MED ORDER — ACETAMINOPHEN 325 MG PO TABS: 650.0000 mg | ORAL_TABLET | Freq: Four times a day (QID) | ORAL | Status: DC | PRN

## 2022-03-21 MED ORDER — ONDANSETRON HCL 4 MG/2ML IJ SOLN: 4.0000 mg | Freq: Four times a day (QID) | INTRAMUSCULAR | Status: DC | PRN

## 2022-03-21 MED ORDER — PANTOPRAZOLE SODIUM 40 MG IV SOLR
40.0000 mg | INTRAVENOUS | Status: DC
  Administered 2022-03-21 – 2022-03-23 (×3): 40 mg via INTRAVENOUS
  Filled 2022-03-21 (×3): qty 10

## 2022-03-21 MED ORDER — ONDANSETRON HCL 4 MG PO TABS: 4.0000 mg | ORAL_TABLET | Freq: Four times a day (QID) | ORAL | Status: DC | PRN

## 2022-03-21 MED ORDER — IPRATROPIUM-ALBUTEROL 0.5-2.5 (3) MG/3ML IN SOLN: 3.0000 mL | Freq: Every day | RESPIRATORY_TRACT | Status: DC | PRN

## 2022-03-21 MED ORDER — CITALOPRAM HYDROBROMIDE 20 MG PO TABS
40.0000 mg | ORAL_TABLET | Freq: Every day | ORAL | Status: DC
  Administered 2022-03-22 – 2022-03-24 (×3): 40 mg via ORAL
  Filled 2022-03-21 (×3): qty 2

## 2022-03-21 MED ORDER — METOPROLOL TARTRATE 50 MG PO TABS
50.0000 mg | ORAL_TABLET | Freq: Two times a day (BID) | ORAL | Status: DC
  Administered 2022-03-21 – 2022-03-24 (×6): 50 mg via ORAL
  Filled 2022-03-21 (×6): qty 1

## 2022-03-21 MED ORDER — ATORVASTATIN CALCIUM 20 MG PO TABS
80.0000 mg | ORAL_TABLET | Freq: Every day | ORAL | Status: DC
  Administered 2022-03-21 – 2022-03-23 (×3): 80 mg via ORAL
  Filled 2022-03-21 (×3): qty 4

## 2022-03-21 MED ORDER — NITROGLYCERIN 0.4 MG SL SUBL: 0.4000 mg | SUBLINGUAL_TABLET | SUBLINGUAL | Status: DC | PRN

## 2022-03-21 MED ORDER — MORPHINE SULFATE (PF) 2 MG/ML IV SOLN
1.0000 mg | INTRAVENOUS | Status: DC | PRN
  Administered 2022-03-21 – 2022-03-24 (×12): 1 mg via INTRAVENOUS
  Filled 2022-03-21 (×13): qty 1

## 2022-03-21 MED ORDER — ACETAMINOPHEN 650 MG RE SUPP: 650.0000 mg | Freq: Four times a day (QID) | RECTAL | Status: DC | PRN

## 2022-03-21 MED ORDER — ISOSORBIDE MONONITRATE ER 30 MG PO TB24
60.0000 mg | ORAL_TABLET | Freq: Every day | ORAL | Status: DC
  Administered 2022-03-22 – 2022-03-24 (×3): 60 mg via ORAL
  Filled 2022-03-21 (×3): qty 2

## 2022-03-21 NOTE — Assessment & Plan Note (Addendum)
Continue Celexa

## 2022-03-21 NOTE — Assessment & Plan Note (Addendum)
Patient has severe peripheral arterial disease and presents for evaluation of rest pain in her right lower extremity. She is status post peripheral angiogram which shows occlusion of the posterior tibial artery in the mid to distal segment and anterior tibial artery in the midsegment without distal reconstitution of these vessels. Patient has been seen by vascular surgery but awaiting GI evaluation prior to initiation of IV heparin for definitive management IV morphine for pain control Continue atorvastatin and metoprolol Hold aspirin

## 2022-03-21 NOTE — Assessment & Plan Note (Signed)
Stable and not acutely exacerbated Continue metoprolol and nitrates

## 2022-03-21 NOTE — Assessment & Plan Note (Signed)
Patient has a BMI of 12.87 kg/m2 Complicates overall prognosis and care

## 2022-03-21 NOTE — H&P (Signed)
. History and Physical    Patient: Carla [name] JYN:[id number] DOB: May 29, 1961 DOA: 03/21/2022 DOS: the patient was seen and examined on 03/21/2022 PCP: Pcp, No  Patient coming from: Home  Chief Complaint: No chief complaint on file.  HPI: Carla [name] is a 61 y.o. female with medical history significant for peripheral arterial disease with right lower extremity pain at rest status post recent peripheral angiogram for reduced right ABI which showed occlusion of the posterior tibial artery in the mid to distal segment and the anterior tibial artery in the midsegment without distal reconstitution of these vessels, history of chronic diastolic dysfunction CHF, anxiety and depression, COPD, paroxysmal A-fib on chronic anticoagulation therapy, morbid obesity who presents to the ER from the vascular clinic for evaluation of rectal bleeding. Patient states that she has had bleeding per rectum for 4 days.  She complains of passing loose stools that contain bright red blood.  She has had multiple episodes of same and also complains of pain in the right lower quadrant which she rates a 6 x 10 in intensity at its worst.  Pain is nonradiating.  She denies having any fever or chills.  She denies any NSAID use and is on apixaban as well as aspirin. She complains of severe pain in her right leg which she rates a 10 x 10 in intensity at its worst and is unable to ambulate due to the pain but denies having any chest pain, no shortness of breath, no dizziness, no lightheadedness, no headache, no urinary symptoms, no cough, no blurred vision no focal deficit. Review of Systems: As mentioned in the history of present illness. All other systems reviewed and are negative. Past Medical History:  Diagnosis Date   Abdominal aortic atherosclerosis ([place])    a. 05/2017 CTA abd/pelvis: significant atherosclerotic dzs of the infrarenal abd Ao w/ some mural thrombus. No aneurysm or dissection.   Acute focal ischemia of small  intestine (HCC)    Acute right-sided low back pain with right-sided sciatica 06/13/2017   AKI (acute kidney injury) ([place]) 07/29/2017   Anginal pain ([place])    a. 08/2012 Lexiscan MV: EF 54%, non ischemia/infarct.   Anxiety and depression    Baker's cyst of knee, right    a. 07/2016 U/S: 4.1 x 1.4 x 2.9 cystic structure in R poplitetal fossa.   Bell's palsy    Borderline diabetes    CHF (congestive heart failure) (HCC)    Chronic anticoagulation    apixaban   Chronic idiopathic constipation    Chronic mesenteric ischemia (HCC)    Chronic pain    COPD (chronic obstructive pulmonary disease) (HCC)    Diastolic dysfunction    a. 05/2017 Echo: Ef 60-65%, no rwma, Gr1 DD, no source of cardiac emboli.   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 05/2017 CTA Abd/pelvis: apparent thrombus or embolus in prox SMA (70-90%); b. 05/2017 catheter directed tPA, mechanical thrombectomy, and stenting of the SMA.   Essential hypertension with goal blood pressure less than 140/90 01/19/2016   Gastroesophageal reflux disease 01/19/2016   GERD (gastroesophageal reflux disease)    H/O colectomy    History of 2019 novel coronavirus disease (COVID-19) 11/14/2019   History of kidney stones    Hyperlipidemia    Hypertension    Morbid obesity with BMI of 40.0-44.9, adult ([place])    Occlusive mesenteric ischemia ([place]) 06/19/2017   Osteoarthritis    PAD (peripheral artery disease) ([place])  PAF (paroxysmal atrial fibrillation) (HCC)    Palpitations    Peripheral edema    Personal history of other malignant neoplasm of skin 06/01/2016   Pneumonia    Pre-diabetes    Primary osteoarthritis of both knees 06/13/2017   SBO (small bowel obstruction) ([place]) 08/06/2017   Skin cancer of nose    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HCC)    Vitamin D deficiency    Past Surgical History:  Procedure Laterality Date   AMPUTATION TOE Left 12/09/2019   Procedure: AMPUTATION TOE MPJ left;  Surgeon:  Caroline More, DPM;  Location: [facility];  Service: Podiatry;  Laterality: Left;   APPENDECTOMY     APPLICATION OF WOUND VAC  12/01/2021   Procedure: APPLICATION OF WOUND VAC;  Surgeon: Algernon Huxley, MD;  Location: [facility];  Service: Vascular;;   CHOLECYSTECTOMY     COLON SURGERY     EMBOLECTOMY OR THROMBECTOMY, WITH OR WITHOUT CATHETER; FEMOROPOPLITEAL, AORTOILIAC ARTERY, BY LEG INCISION N/A 06/21/2020   Left - Decomp Fasciotomy Leg; Ant &/Or Lat Compart Only; Midline - Angiography, Visceral, Selective Or Supraselective (With Or Without Flush Aortogram); Left - Revascularize, Endovasc, Open/Percut, Iliac Artery, Unilat, Initial Vessel; W/Translum Stent, W/Angioplasty; Right - Revascularize, Endovasc, Open/Percut, Iliac Artery, Ea Add`L Ipsilateral; W/Translumin Stent, W/Angioplasty; Location [facility];   ENDARTERECTOMY FEMORAL Left 12/01/2021   Procedure: ENDARTERECTOMY FEMORAL;  Surgeon: Algernon Huxley, MD;  Location: [facility];  Service: Vascular;  Laterality: Left;   ENDOVASCULAR REPAIR/STENT GRAFT Left 12/01/2021   Procedure: ENDOVASCULAR REPAIR/STENT GRAFT;  Surgeon: Algernon Huxley, MD;  Location: [place] [facility];  Service: Cardiovascular;  Laterality: Left;   LAPAROTOMY N/A 08/08/2017   Procedure: EXPLORATORY LAPAROTOMY POSSIBLE BOWEL RESECTION;  Surgeon: Jules Husbands, MD;  Location: [facility];  Service: General;  Laterality: N/A;   LEFT HEART CATH AND CORONARY ANGIOGRAPHY N/A 05/17/2021   Procedure: LEFT HEART CATH AND CORONARY ANGIOGRAPHY;  Surgeon: Yolonda Kida, MD;  Location: [place] [facility];  Service: Cardiovascular;  Laterality: N/A;   LOWER EXTREMITY ANGIOGRAPHY Left 11/17/2019   Procedure: LOWER EXTREMITY ANGIOGRAPHY;  Surgeon: Algernon Huxley, MD;  Location: [place] [facility];  Service: Cardiovascular;  Laterality: Left;   LOWER EXTREMITY ANGIOGRAPHY Right 12/13/2020   Procedure: LOWER EXTREMITY ANGIOGRAPHY;  Surgeon: Algernon Huxley, MD;  Location: [place] [facility];  Service:  Cardiovascular;  Laterality: Right;   LOWER EXTREMITY ANGIOGRAPHY Right 01/10/2021   Procedure: LOWER EXTREMITY ANGIOGRAPHY;  Surgeon: Algernon Huxley, MD;  Location: [place] [facility];  Service: Cardiovascular;  Laterality: Right;   LOWER EXTREMITY ANGIOGRAPHY Right 01/24/2021   Procedure: LOWER EXTREMITY ANGIOGRAPHY;  Surgeon: Algernon Huxley, MD;  Location: [place] [facility];  Service: Cardiovascular;  Laterality: Right;   LOWER EXTREMITY ANGIOGRAPHY Bilateral 06/28/2021   Procedure: Lower Extremity Angiography;  Surgeon: Algernon Huxley, MD;  Location: [place] [facility];  Service: Cardiovascular;  Laterality: Bilateral;   LOWER EXTREMITY ANGIOGRAPHY Right 11/28/2021   Procedure: LOWER EXTREMITY ANGIOGRAPHY;  Surgeon: Algernon Huxley, MD;  Location: [place] [facility];  Service: Cardiovascular;  Laterality: Right;   LOWER EXTREMITY ANGIOGRAPHY Left 11/30/2021   Procedure: Lower Extremity Angiography;  Surgeon: Algernon Huxley, MD;  Location: [place] [facility];  Service: Cardiovascular;  Laterality: Left;   LOWER EXTREMITY ANGIOGRAPHY Right 02/22/2022   Procedure: Lower Extremity Angiography;  Surgeon: Algernon Huxley, MD;  Location: [place] [facility];  Service: Cardiovascular;  Laterality: Right;   LOWER EXTREMITY ANGIOGRAPHY  Right 03/13/2022   Procedure: Lower Extremity Angiography;  Surgeon: Algernon Huxley, MD;  Location: [place] [facility];  Service: Cardiovascular;  Laterality: Right;   LOWER EXTREMITY INTERVENTION N/A 11/19/2019   Procedure: LOWER EXTREMITY INTERVENTION;  Surgeon: Algernon Huxley, MD;  Location: [place] [facility];  Service: Cardiovascular;  Laterality: N/A;   LOWER EXTREMITY INTERVENTION Bilateral 06/29/2021   Procedure: LOWER EXTREMITY INTERVENTION;  Surgeon: Algernon Huxley, MD;  Location: [place] [facility];  Service: Cardiovascular;  Laterality: Bilateral;   TEE WITHOUT CARDIOVERSION N/A 06/22/2017   Procedure: TRANSESOPHAGEAL ECHOCARDIOGRAM (TEE);  Surgeon: Wellington Hampshire, MD;   Location: [facility];  Service: Cardiovascular;  Laterality: N/A;   TOTAL KNEE ARTHROPLASTY Right 07/11/2018   Procedure: TOTAL KNEE ARTHROPLASTY;  Surgeon: Corky Mull, MD;  Location: [facility];  Service: Orthopedics;  Laterality: Right;   VAGINAL HYSTERECTOMY     VISCERAL ARTERY INTERVENTION N/A 06/20/2017   Procedure: Visceral Artery Intervention, possible aortic thrombectomy;  Surgeon: Algernon Huxley, MD;  Location: [place] [facility];  Service: Cardiovascular;  Laterality: N/A;   VISCERAL ARTERY INTERVENTION N/A 01/28/2018   Procedure: VISCERAL ARTERY INTERVENTION;  Surgeon: Algernon Huxley, MD;  Location: [place] [facility];  Service: Cardiovascular;  Laterality: N/A;   Social History:  reports that she has quit smoking. Her smoking use included cigarettes. She smoked an average of 1 pack per day. She has never used smokeless tobacco. She reports that she does not drink alcohol and does not use drugs.  Allergies  Allergen Reactions   Gabapentin     Tremors    Bactrim [Sulfamethoxazole-Trimethoprim] Itching    Family History  Problem Relation Age of Onset   Hypertension Mother    Heart disease Mother    Heart attack Mother    Breast cancer Mother    Hypertension Father    Breast cancer Paternal Aunt     Prior to Admission medications   Medication Sig Start Date End Date Taking? Authorizing Provider  apixaban (ELIQUIS) 5 MG TABS tablet Take 1 tablet (5 mg total) by mouth 2 (two) times daily. 01/25/21  Yes Stegmayer, Janalyn Harder, PA-C  aspirin EC 81 MG tablet Take 81 mg by mouth daily. Swallow whole.   Yes [provider]  atorvastatin (LIPITOR) 80 MG tablet Take 80 mg by mouth at bedtime.   Yes [provider]  citalopram (CELEXA) 40 MG tablet Take 1 tablet (40 mg total) by mouth daily. 02/04/22 05/05/22 Yes Enzo Bi, MD  isosorbide mononitrate (IMDUR) 60 MG 24 hr tablet Take 60 mg by mouth daily. 01/23/22  Yes [provider]  metoprolol tartrate  (LOPRESSOR) 50 MG tablet Take 1 tablet (50 mg total) by mouth 2 (two) times daily. 06/15/18  Yes Mody, Ulice Bold, MD  omeprazole (PRILOSEC) 20 MG capsule Take 20 mg by mouth 2 (two) times daily before a meal.   Yes [provider]  COMBIVENT RESPIMAT 20-100 MCG/ACT AERS respimat Inhale 1 puff into the lungs daily as needed for wheezing or shortness of breath. 04/06/21   [provider]  ferrous sulfate 325 (65 FE) MG tablet Take 325 mg by mouth daily.    [provider]  HYDROcodone-acetaminophen (NORCO/VICODIN) 5-325 MG tablet Take 1 tablet by mouth every 6 (six) hours as needed for moderate pain. Patient not taking: Reported on 03/21/2022 03/07/22 03/07/23  Kris Hartmann, NP  nitroGLYCERIN (NITROSTAT) 0.4 MG SL tablet Place 0.4 mg under the tongue as directed. 06/02/21 06/02/22  [provider]  oxyCODONE-acetaminophen (PERCOCET) 5-325 MG tablet Take 1 tablet by mouth every 4 (four) hours as needed for severe pain. Patient not taking: Reported on 03/21/2022 03/16/22 03/16/23  Blake Divine, MD  pregabalin (LYRICA) 100 MG capsule Take 1 capsule (100 mg total) by mouth 3 (three) times daily. 02/04/22 05/05/22  Enzo Bi, MD  traMADol (ULTRAM) 50 MG tablet Take 50 mg by mouth every 6 (six) hours as needed. 03/15/22   [provider]    Physical Exam: Vitals:   03/21/22 1700  Weight: 99.5 kg  Height: '5\' 2"'$  (1.575 m)   Physical Exam Vitals and nursing note reviewed.  Constitutional:      Appearance: She is obese.     Comments: Chronically ill-appearing.  HENT:     Head: Normocephalic and atraumatic.     Nose: Nose normal.     Mouth/Throat:     Mouth: Mucous membranes are dry.  Eyes:     Conjunctiva/sclera: Conjunctivae normal.  Cardiovascular:     Rate and Rhythm: Normal rate and regular rhythm.  Pulmonary:     Effort: Pulmonary effort is normal.     Breath sounds: Normal breath sounds.  Abdominal:     General: Bowel sounds are normal.      Palpations: Abdomen is soft.     Tenderness: There is abdominal tenderness.     Comments: Ventral wall hernia in the right lower quadrant.  Right lower quadrant tenderness  Musculoskeletal:        General: Normal range of motion.     Cervical back: Normal range of motion and neck supple.  Skin:    General: Skin is warm and dry.  Neurological:     General: No focal deficit present.     Mental Status: She is alert.  Psychiatric:     Comments: Depressed mood, flat affect.     Data Reviewed: No labs available at this time. Results are pending, will review when available.  Assessment and Plan: * Rectal bleeding Patient admitted to the hospital for evaluation of rectal bleeding Patient is on apixaban and aspirin Hold apixaban and aspirin Obtain a CBC and possibly serial H&H Place patient on IV Protonix Gastroenterology consult Obtain CT scan of abdomen and pelvis to rule out diverticular disease as the etiology of her rectal bleeding due to right lower quadrant pain  PAD (peripheral artery disease) ([place]) Patient has severe peripheral arterial disease and presents for evaluation of rest pain in her right lower extremity. She is status post peripheral angiogram which shows occlusion of the posterior tibial artery in the mid to distal segment and anterior tibial artery in the midsegment without distal reconstitution of these vessels. Patient has been seen by vascular surgery but awaiting GI evaluation prior to initiation of IV heparin for definitive management IV morphine for pain control Continue atorvastatin and metoprolol Hold aspirin  PAF (paroxysmal atrial fibrillation) (HCC) Patient has a history of paroxysmal A-fib Continue metoprolol for rate control Hold apixaban  Morbid obesity with BMI of 40.0-44.9, adult ([place]) Patient has a BMI of 15.40 kg/m2 Complicates overall prognosis and care  Chronic diastolic CHF (congestive heart failure) (HCC) Stable and not acutely  exacerbated Continue metoprolol and nitrates  Recurrent major depressive disorder ([place]) Continue Celexa      Advance Care Planning:   Code Status: Full Code   Consults: Gastroenterology/vascular surgery  Family Communication: Greater than 50% of time was spent discussing plan of care with patient at the bedside.  All questions  and concerns have been addressed.  She verbalizes standing and agrees with the plan.  Severity of Illness: The appropriate patient status for this patient is INPATIENT. Inpatient status is judged to be reasonable and necessary in order to provide the required intensity of service to ensure the patient's safety. The patient's presenting symptoms, physical exam findings, and initial radiographic and laboratory data in the context of their chronic comorbidities is felt to place them at high risk for further clinical deterioration. Furthermore, it is not anticipated that the patient will be medically stable for discharge from the hospital within 2 midnights of admission.   * I certify that at the point of admission it is my clinical judgment that the patient will require inpatient hospital care spanning beyond 2 midnights from the point of admission due to high intensity of service, high risk for further deterioration and high frequency of surveillance required.*  Author: Collier Bullock, MD 03/21/2022 5:25 PM  For on call review [email or web address].

## 2022-03-21 NOTE — Assessment & Plan Note (Signed)
Patient has a history of paroxysmal A-fib Continue metoprolol for rate control Hold apixaban

## 2022-03-21 NOTE — Progress Notes (Signed)
MRN : [id number]  Carla [name] is a 61 y.o. (25-Jul-1961) female who presents with chief complaint of  Chief Complaint  Patient presents with   Follow-up    Ultrasound follow up   .  History of Present Illness: Patient returns today in follow up of her peripheral arterial disease.  She has developed right leg rest pain again despite an intervention last week for revascularization.  This has had early reocclusion several times despite appropriate anticoagulation.  She is also having a complicating feature of significant rectal bleeding.  This dramatically complicates her situation and precludes [place] from using tPA.  She has a litany of medical issues including a very large ventral hernia which has been unable to be repaired.  She had a heart attack and an ischemic limb the last time we stopped her anticoagulation to repair her ventral hernia. 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  No current facility-administered medications for this visit.   No current outpatient medications on file.   Facility-Administered Medications Ordered in Other Visits  Medication Dose Route Frequency Provider Last Rate Last Admin   0.9 %  sodium chloride infusion   Intravenous Continuous Agbata, Tochukwu, MD       acetaminophen (TYLENOL) tablet 650 mg  650 mg Oral Q6H PRN Agbata, Tochukwu, MD       Or   acetaminophen (TYLENOL) suppository 650 mg  650 mg Rectal Q6H PRN Agbata, Tochukwu, MD       atorvastatin (LIPITOR) tablet 80 mg  80 mg Oral QHS Agbata, Tochukwu, MD       [START ON 03/22/2022] citalopram (CELEXA) tablet 40 mg  40 mg Oral Daily Agbata, Tochukwu, MD       iohexol (OMNIPAQUE) 9 MG/ML oral solution 500 mL  500 mL Oral Once PRN Agbata, Tochukwu, MD       ipratropium-albuterol (DUONEB) 0.5-2.5 (3) MG/3ML nebulizer solution 3 mL  3 mL Nebulization Daily PRN Agbata, Tochukwu, MD        [START ON 03/22/2022] isosorbide mononitrate (IMDUR) 24 hr tablet 60 mg  60 mg Oral Daily Agbata, Tochukwu, MD       metoprolol tartrate (LOPRESSOR) tablet 50 mg  50 mg Oral BID Agbata, Tochukwu, MD       morphine (PF) 2 MG/ML injection 1 mg  1 mg Intravenous Q4H PRN Agbata, Tochukwu, MD       nitroGLYCERIN (NITROSTAT) SL tablet 0.4 mg  0.4 mg Sublingual Q5 min PRN Agbata, Tochukwu, MD       ondansetron (ZOFRAN) tablet 4 mg  4 mg Oral Q6H PRN Agbata, Tochukwu, MD       Or   ondansetron (ZOFRAN) injection 4 mg  4 mg Intravenous Q6H PRN Agbata, Tochukwu, MD       pantoprazole (PROTONIX) injection 40 mg  40 mg Intravenous Q24H Agbata, Tochukwu, MD       pregabalin (LYRICA) capsule 100 mg  100 mg Oral TID Collier Bullock, MD        Past Medical History:  Diagnosis Date   Abdominal aortic atherosclerosis ([place])    a. 05/2017 CTA abd/pelvis: significant atherosclerotic dzs of the infrarenal abd Ao w/ some mural thrombus. No aneurysm or dissection.   Acute focal ischemia of small intestine (HCC)    Acute right-sided low back pain with right-sided sciatica 06/13/2017   AKI (acute kidney injury) ([place]) 07/29/2017   Anginal  pain ([place])    a. 08/2012 Lexiscan MV: EF 54%, non ischemia/infarct.   Anxiety and depression    Baker's cyst of knee, right    a. 07/2016 U/S: 4.1 x 1.4 x 2.9 cystic structure in R poplitetal fossa.   Bell's palsy    Borderline diabetes    CHF (congestive heart failure) (HCC)    Chronic anticoagulation    apixaban   Chronic idiopathic constipation    Chronic mesenteric ischemia (HCC)    Chronic pain    COPD (chronic obstructive pulmonary disease) (HCC)    Diastolic dysfunction    a. 05/2017 Echo: Ef 60-65%, no rwma, Gr1 DD, no source of cardiac emboli.   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 05/2017 CTA Abd/pelvis: apparent thrombus or embolus in prox SMA (70-90%); b. 05/2017 catheter directed tPA, mechanical thrombectomy, and stenting of the SMA.    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[place]) 06/19/2017   Osteoarthritis    PAD (peripheral artery disease) (HCC)    PAF (paroxysmal atrial fibrillation) (HCC)    Palpitations    Peripheral edema    Personal history of other malignant neoplasm of skin 06/01/2016   Pneumonia    Pre-diabetes    Primary osteoarthritis of both knees 06/13/2017   SBO (small bowel obstruction) ([place]) 08/06/2017   Skin cancer of nose    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HCC)    Vitamin D deficiency     Past Surgical History:  Procedure Laterality Date   AMPUTATION TOE Left 12/09/2019   Procedure: AMPUTATION TOE MPJ left;  Surgeon: Caroline More, DPM;  Location: [facility];  Service: Podiatry;  Laterality: Left;   APPENDECTOMY     APPLICATION OF WOUND VAC  12/01/2021   Procedure: APPLICATION OF WOUND VAC;  Surgeon: Algernon Huxley, MD;  Location: [facility];  Service: Vascular;;   CHOLECYSTECTOMY     COLON SURGERY     EMBOLECTOMY OR THROMBECTOMY, WITH OR WITHOUT CATHETER; FEMOROPOPLITEAL, AORTOILIAC ARTERY, BY LEG INCISION N/A 06/21/2020   Left - Decomp Fasciotomy Leg; Ant &/Or Lat Compart Only; Midline - Angiography, Visceral, Selective Or Supraselective (With Or Without Flush Aortogram); Left - Revascularize, Endovasc, Open/Percut, Iliac Artery, Unilat, Initial Vessel; W/Translum Stent, W/Angioplasty; Right - Revascularize, Endovasc, Open/Percut, Iliac Artery, Ea Add`L Ipsilateral; W/Translumin Stent, W/Angioplasty; Location [facility];   ENDARTERECTOMY FEMORAL Left 12/01/2021   Procedure: ENDARTERECTOMY FEMORAL;  Surgeon: Algernon Huxley, MD;  Location: [facility];   Service: Vascular;  Laterality: Left;   ENDOVASCULAR REPAIR/STENT GRAFT Left 12/01/2021   Procedure: ENDOVASCULAR REPAIR/STENT GRAFT;  Surgeon: Algernon Huxley, MD;  Location: [facility];  Service: Cardiovascular;  Laterality: Left;   LAPAROTOMY N/A 08/08/2017   Procedure: EXPLORATORY LAPAROTOMY POSSIBLE BOWEL RESECTION;  Surgeon: Jules Husbands, MD;  Location: [facility];  Service: General;  Laterality: N/A;   LEFT HEART CATH AND CORONARY ANGIOGRAPHY N/A 05/17/2021   Procedure: LEFT HEART CATH AND CORONARY ANGIOGRAPHY;  Surgeon: Yolonda Kida, MD;  Location: [facility];  Service: Cardiovascular;  Laterality: N/A;   LOWER EXTREMITY ANGIOGRAPHY Left 11/17/2019  Procedure: LOWER EXTREMITY ANGIOGRAPHY;  Surgeon: Algernon Huxley, MD;  Location: [facility];  Service: Cardiovascular;  Laterality: Left;   LOWER EXTREMITY ANGIOGRAPHY Right 12/13/2020   Procedure: LOWER EXTREMITY ANGIOGRAPHY;  Surgeon: Algernon Huxley, MD;  Location: [facility];  Service: Cardiovascular;  Laterality: Right;   LOWER EXTREMITY ANGIOGRAPHY Right 01/10/2021   Procedure: LOWER EXTREMITY ANGIOGRAPHY;  Surgeon: Algernon Huxley, MD;  Location: [place];  Service: Cardiovascular;  Laterality: Right;   LOWER EXTREMITY ANGIOGRAPHY Right 01/24/2021   Procedure: LOWER EXTREMITY ANGIOGRAPHY;  Surgeon: Algernon Huxley, MD;  Location: [facility];  Service: Cardiovascular;  Laterality: Right;   LOWER EXTREMITY ANGIOGRAPHY Bilateral 06/28/2021   Procedure: Lower Extremity Angiography;  Surgeon: Algernon Huxley, MD;  Location: [facility] [facility];  Service: Cardiovascular;  Laterality: Bilateral;   LOWER EXTREMITY ANGIOGRAPHY Right 11/28/2021   Procedure: LOWER EXTREMITY ANGIOGRAPHY;  Surgeon: Algernon Huxley, MD;  Location: [place] [facility];  Service: Cardiovascular;  Laterality: Right;   LOWER EXTREMITY ANGIOGRAPHY Left 11/30/2021   Procedure: Lower Extremity Angiography;  Surgeon: Algernon Huxley,  MD;  Location: [facility];  Service: Cardiovascular;  Laterality: Left;   LOWER EXTREMITY ANGIOGRAPHY Right 02/22/2022   Procedure: Lower Extremity Angiography;  Surgeon: Algernon Huxley, MD;  Location: [place] [facility];  Service: Cardiovascular;  Laterality: Right;   LOWER EXTREMITY ANGIOGRAPHY Right 03/13/2022   Procedure: Lower Extremity Angiography;  Surgeon: Algernon Huxley, MD;  Location: [place] [facility];  Service: Cardiovascular;  Laterality: Right;   LOWER EXTREMITY INTERVENTION N/A 11/19/2019   Procedure: LOWER EXTREMITY INTERVENTION;  Surgeon: Algernon Huxley, MD;  Location[facility];  Service: Cardiovascular;  Laterality: N/A;   LOWER EXTREMITY INTERVENTION Bilateral 06/29/2021   Procedure: LOWER EXTREMITY INTERVENTION;  Surgeon: Algernon Huxley, MD;  Location: [facility];  Service: Cardiovascular;  Laterality: Bilateral;   TEE WITHOUT CARDIOVERSION N/A 06/22/2017   Procedure: TRANSESOPHAGEAL ECHOCARDIOGRAM (TEE);  Surgeon: Wellington Hampshire, MD;  Location: [facility];  Service: Cardiovascular;  Laterality: N/A;   TOTAL KNEE ARTHROPLASTY Right 07/11/2018   Procedure: TOTAL KNEE ARTHROPLASTY;  Surgeon: Corky Mull, MD;  Location: [facility];  Service: Orthopedics;  Laterality: Right;   VAGINAL HYSTERECTOMY     VISCERAL ARTERY INTERVENTION N/A 06/20/2017   Procedure: Visceral Artery Intervention, possible aortic thrombectomy;  Surgeon: Algernon Huxley, MD;  Location: [facility];  Service: Cardiovascular;  Laterality: N/A;   VISCERAL ARTERY INTERVENTION N/A 01/28/2018   Procedure: VISCERAL ARTERY INTERVENTION;  Surgeon: Algernon Huxley, MD;  Location: [facility];  Service: Cardiovascular;  Laterality: N/A;     Social History   Tobacco Use   Smoking status: Former    Packs/day: 1.00    Types: Cigarettes   Smokeless tobacco: Never  Vaping Use   Vaping Use: Never used  Substance Use Topics   Alcohol use: No   Drug use: No      Family  History  Problem Relation Age of Onset   Hypertension Mother    Heart disease Mother    Heart attack Mother    Breast cancer Mother    Hypertension Father    Breast cancer Paternal Aunt      Allergies  Allergen Reactions   Gabapentin     Tremors    Bactrim [Sulfamethoxazole-Trimethoprim] Itching    REVIEW OF SYSTEMS (Negative unless checked)   Constitutional: '[]'$ Weight loss  '[]'$   Fever  '[]'$ Chills Cardiac: '[]'$ Chest pain   '[]'$ Chest pressure   '[]'$ Palpitations   '[]'$ Shortness of breath when laying flat   '[]'$ Shortness of breath at rest   '[]'$ Shortness of breath with exertion. Vascular:  '[]'$ Pain in legs with walking   '[]'$ Pain in legs at rest   '[]'$ Pain in legs when laying flat   '[]'$ Claudication   '[x]'$ Pain in feet when walking  '[x]'$ Pain in feet at rest  '[x]'$ Pain in feet when laying flat   '[]'$ History of DVT   '[]'$ Phlebitis   '[]'$ Swelling in legs   '[]'$ Varicose veins   '[]'$ Non-healing ulcers Pulmonary:   '[]'$ Uses home oxygen   '[]'$ Productive cough   '[]'$ Hemoptysis   '[]'$ Wheeze  '[]'$ COPD   '[]'$ Asthma Neurologic:  '[]'$ Dizziness  '[]'$ Blackouts   '[]'$ Seizures   '[]'$ History of stroke   '[]'$ History of TIA  '[]'$ Aphasia   '[]'$ Temporary blindness   '[]'$ Dysphagia   '[]'$ Weakness or numbness in arms   '[x]'$ Weakness or numbness in legs Musculoskeletal:  '[]'$ Arthritis   '[]'$ Joint swelling   '[]'$ Joint pain   '[]'$ Low back pain Hematologic:  '[]'$ Easy bruising  '[]'$ Easy bleeding   '[x]'$ Hypercoagulable state   '[]'$ Anemic   Gastrointestinal:  '[]'$ Blood in stool   '[]'$ Vomiting blood  '[]'$ Gastroesophageal reflux/heartburn   '[]'$ Abdominal pain Genitourinary:  '[]'$ Chronic kidney disease   '[]'$ Difficult urination  '[]'$ Frequent urination  '[]'$ Burning with urination   '[]'$ Hematuria Skin:  '[]'$ Rashes   '[]'$ Ulcers   '[]'$ Wounds Psychological:  '[]'$ History of anxiety   '[]'$  History of major depression.  Physical Examination  BP (!) 154/101 (BP Location: Left Arm)   Pulse 94   Resp 16  Gen:  WD/WN, NAD.  Obese Head: [place]/AT, No temporalis wasting. Ear/Nose/Throat: Hearing grossly intact, nares w/o erythema or  drainage Eyes: Conjunctiva clear. Sclera non-icteric Neck: Supple.  Trachea midline Pulmonary:  Good air movement, no use of accessory muscles.  Cardiac: RRR, no JVD Vascular:  Vessel Right Left  Radial Palpable Palpable                          PT 1+ palpable 2+ palpable  DP Not palpable 1+ palpable   Gastrointestinal: soft, non-tender/non-distended. No guarding/reflex.  Massive ventral hernia Musculoskeletal: M/S 5/5 throughout.  No deformity or atrophy.  Trace lower extremity edema. Neurologic: Sensation grossly intact in extremities.  Symmetrical.  Speech is fluent.  Psychiatric: Judgment intact, Mood & affect appropriate for pt's clinical situation.  Tearful Dermatologic: No rashes or ulcers noted.  No cellulitis or open wounds.      Labs Recent Results (from the past 2160 hour(s))  I-STAT creatinine     Status: None   Collection Time: 01/04/22 11:19 AM  Result Value Ref Range   Creatinine, Ser 1.00 0.44 - 1.00 mg/dL  Comprehensive metabolic panel     Status: Abnormal   Collection Time: 01/18/22  4:46 PM  Result Value Ref Range   Sodium 136 135 - 145 mmol/L   Potassium 3.2 (L) 3.5 - 5.1 mmol/L   Chloride 104 98 - 111 mmol/L   CO2 23 22 - 32 mmol/L   Glucose, Bld 103 (H) 70 - 99 mg/dL    Comment: Glucose reference range applies only to samples taken after fasting for at least 8 hours.   BUN 13 8 - 23 mg/dL   Creatinine, Ser 1.01 (H) 0.44 - 1.00 mg/dL   Calcium 8.8 (L) 8.9 - 10.3 mg/dL   Total Protein 7.5 6.5 - 8.1 g/dL   Albumin 3.7 3.5 - 5.0 g/dL   AST  19 15 - 41 U/L   ALT 17 0 - 44 U/L   Alkaline Phosphatase 93 38 - 126 U/L   Total Bilirubin 0.7 0.3 - 1.2 mg/dL   GFR, Estimated >60 >60 mL/min    Comment: (NOTE) Calculated using the CKD-EPI Creatinine Equation (2021)    Anion gap 9 5 - 15    Comment: Performed at [facility], [address] [place], [place] [address]  CBC with Differential/Platelet     Status: Abnormal   Collection Time:  01/18/22  4:46 PM  Result Value Ref Range   WBC 7.3 4.0 - 10.5 K/uL   RBC 4.26 3.87 - 5.11 MIL/uL   Hemoglobin 11.6 (L) 12.0 - 15.0 g/dL   HCT 37.5 36.0 - 46.0 %   MCV 88.0 80.0 - 100.0 fL   MCH 27.2 26.0 - 34.0 pg   MCHC 30.9 30.0 - 36.0 g/dL   RDW 15.2 11.5 - 15.5 %   Platelets 375 150 - 400 K/uL   nRBC 0.0 0.0 - 0.2 %   Neutrophils Relative % 56 %   Neutro Abs 4.1 1.7 - 7.7 K/uL   Lymphocytes Relative 35 %   Lymphs Abs 2.5 0.7 - 4.0 K/uL   Monocytes Relative 7 %   Monocytes Absolute 0.5 0.1 - 1.0 K/uL   Eosinophils Relative 1 %   Eosinophils Absolute 0.1 0.0 - 0.5 K/uL   Basophils Relative 1 %   Basophils Absolute 0.1 0.0 - 0.1 K/uL   Immature Granulocytes 0 %   Abs Immature Granulocytes 0.02 0.00 - 0.07 K/uL    Comment: Performed at [facility], [address] [place], [place] [address]  Brain natriuretic peptide     Status: None   Collection Time: 01/18/22  4:46 PM  Result Value Ref Range   B Natriuretic Peptide 29.0 0.0 - 100.0 pg/mL    Comment: Performed at [facility], [place], [place] [address]  Troponin I (High Sensitivity)     Status: None   Collection Time: 01/18/22  4:46 PM  Result Value Ref Range   Troponin I (High Sensitivity) 5 <18 ng/L    Comment: (NOTE) Elevated high sensitivity troponin I (hsTnI) values and significant  changes across serial measurements may suggest ACS but many other  chronic and acute conditions are known to elevate hsTnI results.  Refer to the "Links" section for chest pain algorithms and additional  guidance. Performed at [facility], [address] [place], [place] [address]   Resp Panel by RT-PCR (Flu A&B, Covid) Nasopharyngeal Swab     Status: None   Collection Time: 01/18/22  4:46 PM   Specimen: Nasopharyngeal Swab; Nasopharyngeal(NP) swabs in vial transport medium  Result Value Ref Range   SARS Coronavirus 2 by RT PCR NEGATIVE NEGATIVE    Comment: (NOTE) SARS-CoV-2 target  nucleic acids are NOT DETECTED.  The SARS-CoV-2 RNA is generally detectable in upper respiratory specimens during the acute phase of infection. The lowest concentration of SARS-CoV-2 viral copies this assay can detect is 138 copies/mL. A negative result does not preclude SARS-Cov-2 infection and should not be used as the sole basis for treatment or other patient management decisions. A negative result may occur with  improper specimen collection/handling, submission of specimen other than nasopharyngeal swab, presence of viral mutation(s) within the areas targeted by this assay, and inadequate number of viral copies(<138 copies/mL). A negative result must be combined with clinical observations, patient history, and epidemiological information.  The expected result is Negative.  Fact Sheet for Patients:  EntrepreneurPulse.com.au  Fact Sheet for Healthcare Providers:  IncredibleEmployment.be  This test is no t yet approved or cleared by the [place] FDA and  has been authorized for detection and/or diagnosis of SARS-CoV-2 by FDA under an Emergency Use Authorization (EUA). This EUA will remain  in effect (meaning this test can be used) for the duration of the COVID-19 declaration under Section 564(b)(1) of the Act, 21 U.S.C.section 360bbb-3(b)(1), unless the authorization is terminated  or revoked sooner.       Influenza A by PCR NEGATIVE NEGATIVE   Influenza B by PCR NEGATIVE NEGATIVE    Comment: (NOTE) The Xpert Xpress SARS-CoV-2/FLU/RSV plus assay is intended as an aid in the diagnosis of influenza from Nasopharyngeal swab specimens and should not be used as a sole basis for treatment. Nasal washings and aspirates are unacceptable for Xpert Xpress SARS-CoV-2/FLU/RSV testing.  Fact Sheet for Patients: EntrepreneurPulse.com.au  Fact Sheet for Healthcare Providers: IncredibleEmployment.be  This test  is not yet approved or cleared by the [place] FDA and has been authorized for detection and/or diagnosis of SARS-CoV-2 by FDA under an Emergency Use Authorization (EUA). This EUA will remain in effect (meaning this test can be used) for the duration of the COVID-19 declaration under Section 564(b)(1) of the Act, 21 U.S.C. section 360bbb-3(b)(1), unless the authorization is terminated or revoked.  Performed at [facility], [place], [place] [address]   Troponin I (High Sensitivity)     Status: None   Collection Time: 01/18/22  7:01 PM  Result Value Ref Range   Troponin I (High Sensitivity) 5 <18 ng/L    Comment: (NOTE) Elevated high sensitivity troponin I (hsTnI) values and significant  changes across serial measurements may suggest ACS but many other  chronic and acute conditions are known to elevate hsTnI results.  Refer to the "Links" section for chest pain algorithms and additional  guidance. Performed at [facility], [address] [place], [place] [address]   Basic metabolic panel     Status: Abnormal   Collection Time: 02/03/22  5:19 PM  Result Value Ref Range   Sodium 138 135 - 145 mmol/L   Potassium 3.1 (L) 3.5 - 5.1 mmol/L   Chloride 98 98 - 111 mmol/L   CO2 26 22 - 32 mmol/L   Glucose, Bld 129 (H) 70 - 99 mg/dL    Comment: Glucose reference range applies only to samples taken after fasting for at least 8 hours.   BUN 20 8 - 23 mg/dL   Creatinine, Ser 1.27 (H) 0.44 - 1.00 mg/dL   Calcium 9.2 8.9 - 10.3 mg/dL   GFR, Estimated 48 (L) >60 mL/min    Comment: (NOTE) Calculated using the CKD-EPI Creatinine Equation (2021)    Anion gap 14 5 - 15    Comment: Performed at [facility], [address] [place], [place] [address]  CBC     Status: Abnormal   Collection Time: 02/03/22  5:19 PM  Result Value Ref Range   WBC 10.3 4.0 - 10.5 K/uL   RBC 4.88 3.87 - 5.11 MIL/uL   Hemoglobin 12.9 12.0 - 15.0 g/dL   HCT 42.0  36.0 - 46.0 %   MCV 86.1 80.0 - 100.0 fL   MCH 26.4 26.0 - 34.0 pg   MCHC 30.7 30.0 - 36.0 g/dL   RDW 15.7 (H) 11.5 - 15.5 %   Platelets 289 150 -  400 K/uL   nRBC 0.0 0.0 - 0.2 %    Comment: Performed at [facility], [place], [place] [address]  Troponin I (High Sensitivity)     Status: None   Collection Time: 02/03/22  5:19 PM  Result Value Ref Range   Troponin I (High Sensitivity) 8 <18 ng/L    Comment: (NOTE) Elevated high sensitivity troponin I (hsTnI) values and significant  changes across serial measurements may suggest ACS but many other  chronic and acute conditions are known to elevate hsTnI results.  Refer to the "Links" section for chest pain algorithms and additional  guidance. Performed at [facility], [place], [place] [address]   Troponin I (High Sensitivity)     Status: None   Collection Time: 02/03/22  7:05 PM  Result Value Ref Range   Troponin I (High Sensitivity) 8 <18 ng/L    Comment: (NOTE) Elevated high sensitivity troponin I (hsTnI) values and significant  changes across serial measurements may suggest ACS but many other  chronic and acute conditions are known to elevate hsTnI results.  Refer to the "Links" section for chest pain algorithms and additional  guidance. Performed at [facility], [address] [place], [place] [address]   Resp Panel by RT-PCR (Flu A&B, Covid) Nasopharyngeal Swab     Status: None   Collection Time: 02/03/22  8:47 PM   Specimen: Nasopharyngeal Swab; Nasopharyngeal(NP) swabs in vial transport medium  Result Value Ref Range   SARS Coronavirus 2 by RT PCR NEGATIVE NEGATIVE    Comment: (NOTE) SARS-CoV-2 target nucleic acids are NOT DETECTED.  The SARS-CoV-2 RNA is generally detectable in upper respiratory specimens during the acute phase of infection. The lowest concentration of SARS-CoV-2 viral copies this assay can detect is 138 copies/mL. A negative result  does not preclude SARS-Cov-2 infection and should not be used as the sole basis for treatment or other patient management decisions. A negative result may occur with  improper specimen collection/handling, submission of specimen other than nasopharyngeal swab, presence of viral mutation(s) within the areas targeted by this assay, and inadequate number of viral copies(<138 copies/mL). A negative result must be combined with clinical observations, patient history, and epidemiological information. The expected result is Negative.  Fact Sheet for Patients:  EntrepreneurPulse.com.au  Fact Sheet for Healthcare Providers:  IncredibleEmployment.be  This test is no t yet approved or cleared by the [place] FDA and  has been authorized for detection and/or diagnosis of SARS-CoV-2 by FDA under an Emergency Use Authorization (EUA). This EUA will remain  in effect (meaning this test can be used) for the duration of the COVID-19 declaration under Section 564(b)(1) of the Act, 21 U.S.C.section 360bbb-3(b)(1), unless the authorization is terminated  or revoked sooner.       Influenza A by PCR NEGATIVE NEGATIVE   Influenza B by PCR NEGATIVE NEGATIVE    Comment: (NOTE) The Xpert Xpress SARS-CoV-2/FLU/RSV plus assay is intended as an aid in the diagnosis of influenza from Nasopharyngeal swab specimens and should not be used as a sole basis for treatment. Nasal washings and aspirates are unacceptable for Xpert Xpress SARS-CoV-2/FLU/RSV testing.  Fact Sheet for Patients: EntrepreneurPulse.com.au  Fact Sheet for Healthcare Providers: IncredibleEmployment.be  This test is not yet approved or cleared by the [place] FDA and has been authorized for detection and/or diagnosis of SARS-CoV-2 by FDA under an Emergency Use Authorization (EUA). This EUA will remain in effect (meaning this test can be  used) for the duration  of the COVID-19 declaration under Section 564(b)(1) of the Act, 21 U.S.C. section 360bbb-3(b)(1), unless the authorization is terminated or revoked.  Performed at [facility], [address] [place], [place] [address]   HIV Antibody (routine testing w rflx)     Status: None   Collection Time: 02/04/22  7:32 AM  Result Value Ref Range   HIV Screen 4th Generation wRfx Non Reactive Non Reactive    Comment: Performed at [facility], [place] [address], [place], [place] [address]  Basic metabolic panel     Status: Abnormal   Collection Time: 02/04/22  7:32 AM  Result Value Ref Range   Sodium 137 135 - 145 mmol/L   Potassium 3.5 3.5 - 5.1 mmol/L   Chloride 104 98 - 111 mmol/L   CO2 21 (L) 22 - 32 mmol/L   Glucose, Bld 189 (H) 70 - 99 mg/dL    Comment: Glucose reference range applies only to samples taken after fasting for at least 8 hours.   BUN 22 8 - 23 mg/dL   Creatinine, Ser 1.04 (H) 0.44 - 1.00 mg/dL   Calcium 8.7 (L) 8.9 - 10.3 mg/dL   GFR, Estimated >60 >60 mL/min    Comment: (NOTE) Calculated using the CKD-EPI Creatinine Equation (2021)    Anion gap 12 5 - 15    Comment: Performed at [facility], [address] [place], [place] [address]  Magnesium     Status: None   Collection Time: 02/04/22  7:32 AM  Result Value Ref Range   Magnesium 2.0 1.7 - 2.4 mg/dL    Comment: Performed at [facility], [address] [place], [place] [address]  BUN     Status: None   Collection Time: 02/22/22 10:18 AM  Result Value Ref Range   BUN 17 8 - 23 mg/dL    Comment: Performed at [facility], [address] [place], [place] [address]  Creatinine, serum     Status: Abnormal   Collection Time: 02/22/22 10:18 AM  Result Value Ref Range   Creatinine, Ser 1.10 (H) 0.44 - 1.00 mg/dL   GFR, Estimated 57 (L) >60 mL/min    Comment: (NOTE) Calculated using the CKD-EPI Creatinine Equation (2021) Performed at [facility], [address] [place], [place] [address]   BUN     Status: None   Collection Time: 03/13/22  9:58 AM  Result Value Ref Range   BUN 12 8 - 23 mg/dL    Comment: Performed at [facility], [address] [place], [place] [address]  Creatinine, serum     Status: Abnormal   Collection Time: 03/13/22  9:58 AM  Result Value Ref Range   Creatinine, Ser 1.16 (H) 0.44 - 1.00 mg/dL   GFR, Estimated 53 (L) >60 mL/min    Comment: (NOTE) Calculated using the CKD-EPI Creatinine Equation (2021) Performed at [facility], [address], [place], [place] [address]     Radiology PERIPHERAL VASCULAR CATHETERIZATION  Result Date: 03/13/2022 See surgical note for result.  PERIPHERAL VASCULAR CATHETERIZATION  Result Date: 02/22/2022 See surgical note for result.  VAS [place] ABI WITH/WO TBI  Result Date: 03/09/2022  LOWER EXTREMITY DOPPLER STUDY Patient Name:  Carla [name]  Date of Exam:   03/07/2022 Medical Rec #: [id number]      Accession #:    [id number] Date of Birth: 24-Dec-1960       Patient Gender: F Patient  Age:   20 years Exam Location:  [facility] Procedure:      VAS [place] ABI WITH/WO TBI Referring Phys: Arna Medici BROWN --------------------------------------------------------------------------------  Indications: LLE pain.  Vascular Interventions: 12/13/20: right cia,eia thrombectomy                         01/10/21: rt thrombectomy new bilat kissing stents in                         cia's, addtl rt eia stent;                         01/24/21: repeat thrombectomy right;                         06/29/21: Right CIA/EIA thrombectomy with bilateral CIA                         PTAs;                         11/28/21: Left CIA/EIA thrombectomy/PTA;                         12/01/2021: Left CFA,PFA and SFA Endartectomies and                         patch. Performing Technologist: Blondell Reveal RT, RDMS, RVT  Examination Guidelines: A complete evaluation includes at minimum, Doppler waveform signals and  systolic blood pressure reading at the level of bilateral brachial, anterior tibial, and posterior tibial arteries, when vessel segments are accessible. Bilateral testing is considered an integral part of a complete examination. Photoelectric Plethysmograph (PPG) waveforms and toe systolic pressure readings are included as required and additional duplex testing as needed. Limited examinations for reoccurring indications may be performed as noted.  ABI Findings: +---------+------------------+-----+------------+--------+ Right    Rt Pressure (mmHg)IndexWaveform    Comment  +---------+------------------+-----+------------+--------+ Brachial 136                                         +---------+------------------+-----+------------+--------+ ATA      64                0.46 monophasic           +---------+------------------+-----+------------+--------+ PTA                             not detected         +---------+------------------+-----+------------+--------+ PERO     62                0.45 monophasic           +---------+------------------+-----+------------+--------+ Great Toe40                0.29 Abnormal             +---------+------------------+-----+------------+--------+ +---------+------------------+-----+----------+-------+ Left     Lt Pressure (mmHg)IndexWaveform  Comment +---------+------------------+-----+----------+-------+ Brachial 139                                      +---------+------------------+-----+----------+-------+  ATA      103               0.74 monophasic        +---------+------------------+-----+----------+-------+ PTA      67                0.48 monophasic        +---------+------------------+-----+----------+-------+ Great Toe58                0.42 Normal            +---------+------------------+-----+----------+-------+ +-------+-----------+-----------+------------+------------+ ABI/TBIToday's ABIToday's  TBIPrevious ABIPrevious TBI +-------+-----------+-----------+------------+------------+ Right  0.46       0.29       0.63        0.61         +-------+-----------+-----------+------------+------------+ Left   0.74       0.42       0.75        0.75         +-------+-----------+-----------+------------+------------+ Right ABIs appear decreased compared to prior study on 02/15/22. Left ABIs appear essentially unchanged.  Summary: Right: Resting right ankle-brachial index indicates severe right lower extremity arterial disease. The right toe-brachial index is abnormal. Left: Resting left ankle-brachial index indicates moderate left lower extremity arterial disease. The left toe-brachial index is abnormal. *See table(s) above for measurements and observations.  Electronically signed by [name] MD on 03/09/2022 at 12:24:25 PM.    Final     Assessment/Plan Essential hypertension with goal blood pressure less than 140/90 blood pressure control important in reducing the progression of atherosclerotic disease. On appropriate oral medications.     Prediabetes blood glucose control important in reducing the progression of atherosclerotic disease. Also, involved in wound healing. On appropriate medications.     Hyperlipidemia lipid control important in reducing the progression of atherosclerotic disease. 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  This could be contributing to her rectal bleeding although I am not sure.  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 This is an incredibly challenging and difficult situation.  She has an ischemic leg with what sounds like a relatively  active GI bleed.  We have arranged for her to be admitted to the hospitalist service for GI work-up.  She will likely need another right lower extremity angiogram although I am very concerned about the durability of any intervention as it has failed even on anticoagulation recently.  I have discussed with her that we need to strongly consider proceeding with amputation if we have another failure.  If we continue to perform interventions and anticoagulate her if she has bleeding issues, that may be a life-threatening situation and not just a limb threatening situation.  The patient is tearful but understands the gravity of the situation.  We will be following her in the hospital and at some point we will plan a angiogram with possible revascularization after her GI work-up.    [name], MD  03/21/2022 5:30 PM    This note was created with Dragon medical transcription system.  Any errors from dictation are purely unintentional

## 2022-03-21 NOTE — Assessment & Plan Note (Addendum)
Patient admitted to the hospital for evaluation of rectal bleeding Patient is on apixaban and aspirin Hold apixaban and aspirin Obtain a CBC and possibly serial H&H Place patient on IV Protonix Gastroenterology consult Obtain CT scan of abdomen and pelvis to rule out diverticular disease as the etiology of her rectal bleeding due to right lower quadrant pain

## 2022-03-21 NOTE — Assessment & Plan Note (Signed)
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 This is an incredibly challenging and difficult situation.  She has an ischemic leg with what sounds like a relatively active GI bleed.  We have arranged for her to be admitted to the hospitalist service for GI work-up.  She will likely need another right lower extremity angiogram although I am very concerned about the durability of any intervention as it has failed even on anticoagulation recently.  I have discussed with her that we need to strongly consider proceeding with amputation if we have another failure.  If we continue to perform interventions and anticoagulate her if she has bleeding issues, that may be a life-threatening situation and not just a limb threatening situation.  The patient is tearful but understands the gravity of the situation.  We will be following her in the hospital and at some point we will plan a angiogram with possible revascularization after her GI work-up.

## 2022-03-21 NOTE — H&P (View-Only) (Signed)
MRN : [id number]  Carla [name] is a 61 y.o. (15-May-1961) female who presents with chief complaint of  Chief Complaint  Patient presents with   Follow-up    Ultrasound follow up   .  History of Present Illness: Patient returns today in follow up of her peripheral arterial disease.  She has developed right leg rest pain again despite an intervention last week for revascularization.  This has had early reocclusion several times despite appropriate anticoagulation.  She is also having a complicating feature of significant rectal bleeding.  This dramatically complicates her situation and precludes [place] from using tPA.  She has a litany of medical issues including a very large ventral hernia which has been unable to be repaired.  She had a heart attack and an ischemic limb the last time we stopped her anticoagulation to repair her ventral hernia. 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  No current facility-administered medications for this visit.   No current outpatient medications on file.   Facility-Administered Medications Ordered in Other Visits  Medication Dose Route Frequency Provider Last Rate Last Admin   0.9 %  sodium chloride infusion   Intravenous Continuous Agbata, Tochukwu, MD       acetaminophen (TYLENOL) tablet 650 mg  650 mg Oral Q6H PRN Agbata, Tochukwu, MD       Or   acetaminophen (TYLENOL) suppository 650 mg  650 mg Rectal Q6H PRN Agbata, Tochukwu, MD       atorvastatin (LIPITOR) tablet 80 mg  80 mg Oral QHS Agbata, Tochukwu, MD       [START ON 03/22/2022] citalopram (CELEXA) tablet 40 mg  40 mg Oral Daily Agbata, Tochukwu, MD       iohexol (OMNIPAQUE) 9 MG/ML oral solution 500 mL  500 mL Oral Once PRN Agbata, Tochukwu, MD       ipratropium-albuterol (DUONEB) 0.5-2.5 (3) MG/3ML nebulizer solution 3 mL  3 mL Nebulization Daily PRN Agbata, Tochukwu, MD        [START ON 03/22/2022] isosorbide mononitrate (IMDUR) 24 hr tablet 60 mg  60 mg Oral Daily Agbata, Tochukwu, MD       metoprolol tartrate (LOPRESSOR) tablet 50 mg  50 mg Oral BID Agbata, Tochukwu, MD       morphine (PF) 2 MG/ML injection 1 mg  1 mg Intravenous Q4H PRN Agbata, Tochukwu, MD       nitroGLYCERIN (NITROSTAT) SL tablet 0.4 mg  0.4 mg Sublingual Q5 min PRN Agbata, Tochukwu, MD       ondansetron (ZOFRAN) tablet 4 mg  4 mg Oral Q6H PRN Agbata, Tochukwu, MD       Or   ondansetron (ZOFRAN) injection 4 mg  4 mg Intravenous Q6H PRN Agbata, Tochukwu, MD       pantoprazole (PROTONIX) injection 40 mg  40 mg Intravenous Q24H Agbata, Tochukwu, MD       pregabalin (LYRICA) capsule 100 mg  100 mg Oral TID Collier Bullock, MD        Past Medical History:  Diagnosis Date   Abdominal aortic atherosclerosis ([place])    a. 05/2017 CTA abd/pelvis: significant atherosclerotic dzs of the infrarenal abd Ao w/ some mural thrombus. No aneurysm or dissection.   Acute focal ischemia of small intestine (HCC)    Acute right-sided low back pain with right-sided sciatica 06/13/2017   AKI (acute kidney injury) ([place]) 07/29/2017   Anginal  pain ([place])    a. 08/2012 Lexiscan MV: EF 54%, non ischemia/infarct.   Anxiety and depression    Baker's cyst of knee, right    a. 07/2016 U/S: 4.1 x 1.4 x 2.9 cystic structure in R poplitetal fossa.   Bell's palsy    Borderline diabetes    CHF (congestive heart failure) (HCC)    Chronic anticoagulation    apixaban   Chronic idiopathic constipation    Chronic mesenteric ischemia (HCC)    Chronic pain    COPD (chronic obstructive pulmonary disease) (HCC)    Diastolic dysfunction    a. 05/2017 Echo: Ef 60-65%, no rwma, Gr1 DD, no source of cardiac emboli.   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 05/2017 CTA Abd/pelvis: apparent thrombus or embolus in prox SMA (70-90%); b. 05/2017 catheter directed tPA, mechanical thrombectomy, and stenting of the SMA.    Essential hypertension with goal blood pressure less than 140/90 01/19/2016   Gastroesophageal reflux disease 01/19/2016   GERD (gastroesophageal reflux disease)    H/O colectomy    History of 2019 novel coronavirus disease (COVID-19) 11/14/2019   History of kidney stones    Hyperlipidemia    Hypertension    Morbid obesity with BMI of 40.0-44.9, adult ([place])    Occlusive mesenteric ischemia ([place]) 06/19/2017   Osteoarthritis    PAD (peripheral artery disease) (HCC)    PAF (paroxysmal atrial fibrillation) (HCC)    Palpitations    Peripheral edema    Personal history of other malignant neoplasm of skin 06/01/2016   Pneumonia    Pre-diabetes    Primary osteoarthritis of both knees 06/13/2017   SBO (small bowel obstruction) ([place]) 08/06/2017   Skin cancer of nose    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HCC)    Vitamin D deficiency     Past Surgical History:  Procedure Laterality Date   AMPUTATION TOE Left 12/09/2019   Procedure: AMPUTATION TOE MPJ left;  Surgeon: Caroline More, DPM;  Location: [facility];  Service: Podiatry;  Laterality: Left;   APPENDECTOMY     APPLICATION OF WOUND VAC  12/01/2021   Procedure: APPLICATION OF WOUND VAC;  Surgeon: Algernon Huxley, MD;  Location: [facility];  Service: Vascular;;   CHOLECYSTECTOMY     COLON SURGERY     EMBOLECTOMY OR THROMBECTOMY, WITH OR WITHOUT CATHETER; FEMOROPOPLITEAL, AORTOILIAC ARTERY, BY LEG INCISION N/A 06/21/2020   Left - Decomp Fasciotomy Leg; Ant &/Or Lat Compart Only; Midline - Angiography, Visceral, Selective Or Supraselective (With Or Without Flush Aortogram); Left - Revascularize, Endovasc, Open/Percut, Iliac Artery, Unilat, Initial Vessel; W/Translum Stent, W/Angioplasty; Right - Revascularize, Endovasc, Open/Percut, Iliac Artery, Ea Add`L Ipsilateral; W/Translumin Stent, W/Angioplasty; Location [facility];   ENDARTERECTOMY FEMORAL Left 12/01/2021   Procedure: ENDARTERECTOMY FEMORAL;  Surgeon: Algernon Huxley, MD;  Location: [facility];   Service: Vascular;  Laterality: Left;   ENDOVASCULAR REPAIR/STENT GRAFT Left 12/01/2021   Procedure: ENDOVASCULAR REPAIR/STENT GRAFT;  Surgeon: Algernon Huxley, MD;  Location: [place] [facility];  Service: Cardiovascular;  Laterality: Left;   LAPAROTOMY N/A 08/08/2017   Procedure: EXPLORATORY LAPAROTOMY POSSIBLE BOWEL RESECTION;  Surgeon: Jules Husbands, MD;  Location: [facility];  Service: General;  Laterality: N/A;   LEFT HEART CATH AND CORONARY ANGIOGRAPHY N/A 05/17/2021   Procedure: LEFT HEART CATH AND CORONARY ANGIOGRAPHY;  Surgeon: Yolonda Kida, MD;  Location: [place] [facility];  Service: Cardiovascular;  Laterality: N/A;   LOWER EXTREMITY ANGIOGRAPHY Left 11/17/2019  Procedure: LOWER EXTREMITY ANGIOGRAPHY;  Surgeon: Algernon Huxley, MD;  Location: [place] [facility];  Service: Cardiovascular;  Laterality: Left;   LOWER EXTREMITY ANGIOGRAPHY Right 12/13/2020   Procedure: LOWER EXTREMITY ANGIOGRAPHY;  Surgeon: Algernon Huxley, MD;  Location: [place] [facility];  Service: Cardiovascular;  Laterality: Right;   LOWER EXTREMITY ANGIOGRAPHY Right 01/10/2021   Procedure: LOWER EXTREMITY ANGIOGRAPHY;  Surgeon: Algernon Huxley, MD;  Location: [place] [facility];  Service: Cardiovascular;  Laterality: Right;   LOWER EXTREMITY ANGIOGRAPHY Right 01/24/2021   Procedure: LOWER EXTREMITY ANGIOGRAPHY;  Surgeon: Algernon Huxley, MD;  Location: [place] [facility];  Service: Cardiovascular;  Laterality: Right;   LOWER EXTREMITY ANGIOGRAPHY Bilateral 06/28/2021   Procedure: Lower Extremity Angiography;  Surgeon: Algernon Huxley, MD;  Location: [place] [facility];  Service: Cardiovascular;  Laterality: Bilateral;   LOWER EXTREMITY ANGIOGRAPHY Right 11/28/2021   Procedure: LOWER EXTREMITY ANGIOGRAPHY;  Surgeon: Algernon Huxley, MD;  Location: [place] [facility];  Service: Cardiovascular;  Laterality: Right;   LOWER EXTREMITY ANGIOGRAPHY Left 11/30/2021   Procedure: Lower Extremity Angiography;  Surgeon: Algernon Huxley,  MD;  Location: [place] [facility];  Service: Cardiovascular;  Laterality: Left;   LOWER EXTREMITY ANGIOGRAPHY Right 02/22/2022   Procedure: Lower Extremity Angiography;  Surgeon: Algernon Huxley, MD;  Location: [place] [facility];  Service: Cardiovascular;  Laterality: Right;   LOWER EXTREMITY ANGIOGRAPHY Right 03/13/2022   Procedure: Lower Extremity Angiography;  Surgeon: Algernon Huxley, MD;  Location: [place] [facility];  Service: Cardiovascular;  Laterality: Right;   LOWER EXTREMITY INTERVENTION N/A 11/19/2019   Procedure: LOWER EXTREMITY INTERVENTION;  Surgeon: Algernon Huxley, MD;  Location: [place] [facility];  Service: Cardiovascular;  Laterality: N/A;   LOWER EXTREMITY INTERVENTION Bilateral 06/29/2021   Procedure: LOWER EXTREMITY INTERVENTION;  Surgeon: Algernon Huxley, MD;  Location: [place] [facility];  Service: Cardiovascular;  Laterality: Bilateral;   TEE WITHOUT CARDIOVERSION N/A 06/22/2017   Procedure: TRANSESOPHAGEAL ECHOCARDIOGRAM (TEE);  Surgeon: Wellington Hampshire, MD;  Location: [facility];  Service: Cardiovascular;  Laterality: N/A;   TOTAL KNEE ARTHROPLASTY Right 07/11/2018   Procedure: TOTAL KNEE ARTHROPLASTY;  Surgeon: Corky Mull, MD;  Location: [facility];  Service: Orthopedics;  Laterality: Right;   VAGINAL HYSTERECTOMY     VISCERAL ARTERY INTERVENTION N/A 06/20/2017   Procedure: Visceral Artery Intervention, possible aortic thrombectomy;  Surgeon: Algernon Huxley, MD;  Location: [place] [facility];  Service: Cardiovascular;  Laterality: N/A;   VISCERAL ARTERY INTERVENTION N/A 01/28/2018   Procedure: VISCERAL ARTERY INTERVENTION;  Surgeon: Algernon Huxley, MD;  Location: [place] [facility];  Service: Cardiovascular;  Laterality: N/A;     Social History   Tobacco Use   Smoking status: Former    Packs/day: 1.00    Types: Cigarettes   Smokeless tobacco: Never  Vaping Use   Vaping Use: Never used  Substance Use Topics   Alcohol use: No   Drug use: No      Family  History  Problem Relation Age of Onset   Hypertension Mother    Heart disease Mother    Heart attack Mother    Breast cancer Mother    Hypertension Father    Breast cancer Paternal Aunt      Allergies  Allergen Reactions   Gabapentin     Tremors    Bactrim [Sulfamethoxazole-Trimethoprim] Itching    REVIEW OF SYSTEMS (Negative unless checked)   Constitutional: '[]'$ Weight loss  '[]'$   Fever  '[]'$ Chills Cardiac: '[]'$ Chest pain   '[]'$ Chest pressure   '[]'$ Palpitations   '[]'$ Shortness of breath when laying flat   '[]'$ Shortness of breath at rest   '[]'$ Shortness of breath with exertion. Vascular:  '[]'$ Pain in legs with walking   '[]'$ Pain in legs at rest   '[]'$ Pain in legs when laying flat   '[]'$ Claudication   '[x]'$ Pain in feet when walking  '[x]'$ Pain in feet at rest  '[x]'$ Pain in feet when laying flat   '[]'$ History of DVT   '[]'$ Phlebitis   '[]'$ Swelling in legs   '[]'$ Varicose veins   '[]'$ Non-healing ulcers Pulmonary:   '[]'$ Uses home oxygen   '[]'$ Productive cough   '[]'$ Hemoptysis   '[]'$ Wheeze  '[]'$ COPD   '[]'$ Asthma Neurologic:  '[]'$ Dizziness  '[]'$ Blackouts   '[]'$ Seizures   '[]'$ History of stroke   '[]'$ History of TIA  '[]'$ Aphasia   '[]'$ Temporary blindness   '[]'$ Dysphagia   '[]'$ Weakness or numbness in arms   '[x]'$ Weakness or numbness in legs Musculoskeletal:  '[]'$ Arthritis   '[]'$ Joint swelling   '[]'$ Joint pain   '[]'$ Low back pain Hematologic:  '[]'$ Easy bruising  '[]'$ Easy bleeding   '[x]'$ Hypercoagulable state   '[]'$ Anemic   Gastrointestinal:  '[]'$ Blood in stool   '[]'$ Vomiting blood  '[]'$ Gastroesophageal reflux/heartburn   '[]'$ Abdominal pain Genitourinary:  '[]'$ Chronic kidney disease   '[]'$ Difficult urination  '[]'$ Frequent urination  '[]'$ Burning with urination   '[]'$ Hematuria Skin:  '[]'$ Rashes   '[]'$ Ulcers   '[]'$ Wounds Psychological:  '[]'$ History of anxiety   '[]'$  History of major depression.  Physical Examination  BP (!) 154/101 (BP Location: Left Arm)   Pulse 94   Resp 16  Gen:  WD/WN, NAD.  Obese Head: [place]/AT, No temporalis wasting. Ear/Nose/Throat: Hearing grossly intact, nares w/o erythema or  drainage Eyes: Conjunctiva clear. Sclera non-icteric Neck: Supple.  Trachea midline Pulmonary:  Good air movement, no use of accessory muscles.  Cardiac: RRR, no JVD Vascular:  Vessel Right Left  Radial Palpable Palpable                          PT 1+ palpable 2+ palpable  DP Not palpable 1+ palpable   Gastrointestinal: soft, non-tender/non-distended. No guarding/reflex.  Massive ventral hernia Musculoskeletal: M/S 5/5 throughout.  No deformity or atrophy.  Trace lower extremity edema. Neurologic: Sensation grossly intact in extremities.  Symmetrical.  Speech is fluent.  Psychiatric: Judgment intact, Mood & affect appropriate for pt's clinical situation.  Tearful Dermatologic: No rashes or ulcers noted.  No cellulitis or open wounds.      Labs Recent Results (from the past 2160 hour(s))  I-STAT creatinine     Status: None   Collection Time: 01/04/22 11:19 AM  Result Value Ref Range   Creatinine, Ser 1.00 0.44 - 1.00 mg/dL  Comprehensive metabolic panel     Status: Abnormal   Collection Time: 01/18/22  4:46 PM  Result Value Ref Range   Sodium 136 135 - 145 mmol/L   Potassium 3.2 (L) 3.5 - 5.1 mmol/L   Chloride 104 98 - 111 mmol/L   CO2 23 22 - 32 mmol/L   Glucose, Bld 103 (H) 70 - 99 mg/dL    Comment: Glucose reference range applies only to samples taken after fasting for at least 8 hours.   BUN 13 8 - 23 mg/dL   Creatinine, Ser 1.01 (H) 0.44 - 1.00 mg/dL   Calcium 8.8 (L) 8.9 - 10.3 mg/dL   Total Protein 7.5 6.5 - 8.1 g/dL   Albumin 3.7 3.5 - 5.0 g/dL   AST  19 15 - 41 U/L   ALT 17 0 - 44 U/L   Alkaline Phosphatase 93 38 - 126 U/L   Total Bilirubin 0.7 0.3 - 1.2 mg/dL   GFR, Estimated >60 >60 mL/min    Comment: (NOTE) Calculated using the CKD-EPI Creatinine Equation (2021)    Anion gap 9 5 - 15    Comment: Performed at [facility], [address] [place], [place] [address]  CBC with Differential/Platelet     Status: Abnormal   Collection Time:  01/18/22  4:46 PM  Result Value Ref Range   WBC 7.3 4.0 - 10.5 K/uL   RBC 4.26 3.87 - 5.11 MIL/uL   Hemoglobin 11.6 (L) 12.0 - 15.0 g/dL   HCT 37.5 36.0 - 46.0 %   MCV 88.0 80.0 - 100.0 fL   MCH 27.2 26.0 - 34.0 pg   MCHC 30.9 30.0 - 36.0 g/dL   RDW 15.2 11.5 - 15.5 %   Platelets 375 150 - 400 K/uL   nRBC 0.0 0.0 - 0.2 %   Neutrophils Relative % 56 %   Neutro Abs 4.1 1.7 - 7.7 K/uL   Lymphocytes Relative 35 %   Lymphs Abs 2.5 0.7 - 4.0 K/uL   Monocytes Relative 7 %   Monocytes Absolute 0.5 0.1 - 1.0 K/uL   Eosinophils Relative 1 %   Eosinophils Absolute 0.1 0.0 - 0.5 K/uL   Basophils Relative 1 %   Basophils Absolute 0.1 0.0 - 0.1 K/uL   Immature Granulocytes 0 %   Abs Immature Granulocytes 0.02 0.00 - 0.07 K/uL    Comment: Performed at [facility], [address] [place], [place] [address]  Brain natriuretic peptide     Status: None   Collection Time: 01/18/22  4:46 PM  Result Value Ref Range   B Natriuretic Peptide 29.0 0.0 - 100.0 pg/mL    Comment: Performed at [facility], [place], [place] [address]  Troponin I (High Sensitivity)     Status: None   Collection Time: 01/18/22  4:46 PM  Result Value Ref Range   Troponin I (High Sensitivity) 5 <18 ng/L    Comment: (NOTE) Elevated high sensitivity troponin I (hsTnI) values and significant  changes across serial measurements may suggest ACS but many other  chronic and acute conditions are known to elevate hsTnI results.  Refer to the "Links" section for chest pain algorithms and additional  guidance. Performed at [facility], [address] [place], [place] [address]   Resp Panel by RT-PCR (Flu A&B, Covid) Nasopharyngeal Swab     Status: None   Collection Time: 01/18/22  4:46 PM   Specimen: Nasopharyngeal Swab; Nasopharyngeal(NP) swabs in vial transport medium  Result Value Ref Range   SARS Coronavirus 2 by RT PCR NEGATIVE NEGATIVE    Comment: (NOTE) SARS-CoV-2 target  nucleic acids are NOT DETECTED.  The SARS-CoV-2 RNA is generally detectable in upper respiratory specimens during the acute phase of infection. The lowest concentration of SARS-CoV-2 viral copies this assay can detect is 138 copies/mL. A negative result does not preclude SARS-Cov-2 infection and should not be used as the sole basis for treatment or other patient management decisions. A negative result may occur with  improper specimen collection/handling, submission of specimen other than nasopharyngeal swab, presence of viral mutation(s) within the areas targeted by this assay, and inadequate number of viral copies(<138 copies/mL). A negative result must be combined with clinical observations, patient history, and epidemiological information.  The expected result is Negative.  Fact Sheet for Patients:  EntrepreneurPulse.com.au  Fact Sheet for Healthcare Providers:  IncredibleEmployment.be  This test is no t yet approved or cleared by the [place] FDA and  has been authorized for detection and/or diagnosis of SARS-CoV-2 by FDA under an Emergency Use Authorization (EUA). This EUA will remain  in effect (meaning this test can be used) for the duration of the COVID-19 declaration under Section 564(b)(1) of the Act, 21 U.S.C.section 360bbb-3(b)(1), unless the authorization is terminated  or revoked sooner.       Influenza A by PCR NEGATIVE NEGATIVE   Influenza B by PCR NEGATIVE NEGATIVE    Comment: (NOTE) The Xpert Xpress SARS-CoV-2/FLU/RSV plus assay is intended as an aid in the diagnosis of influenza from Nasopharyngeal swab specimens and should not be used as a sole basis for treatment. Nasal washings and aspirates are unacceptable for Xpert Xpress SARS-CoV-2/FLU/RSV testing.  Fact Sheet for Patients: EntrepreneurPulse.com.au  Fact Sheet for Healthcare Providers: IncredibleEmployment.be  This test  is not yet approved or cleared by the [place] FDA and has been authorized for detection and/or diagnosis of SARS-CoV-2 by FDA under an Emergency Use Authorization (EUA). This EUA will remain in effect (meaning this test can be used) for the duration of the COVID-19 declaration under Section 564(b)(1) of the Act, 21 U.S.C. section 360bbb-3(b)(1), unless the authorization is terminated or revoked.  Performed at [facility], [place], [place] [address]   Troponin I (High Sensitivity)     Status: None   Collection Time: 01/18/22  7:01 PM  Result Value Ref Range   Troponin I (High Sensitivity) 5 <18 ng/L    Comment: (NOTE) Elevated high sensitivity troponin I (hsTnI) values and significant  changes across serial measurements may suggest ACS but many other  chronic and acute conditions are known to elevate hsTnI results.  Refer to the "Links" section for chest pain algorithms and additional  guidance. Performed at [facility], [address] [place], [place] [address]   Basic metabolic panel     Status: Abnormal   Collection Time: 02/03/22  5:19 PM  Result Value Ref Range   Sodium 138 135 - 145 mmol/L   Potassium 3.1 (L) 3.5 - 5.1 mmol/L   Chloride 98 98 - 111 mmol/L   CO2 26 22 - 32 mmol/L   Glucose, Bld 129 (H) 70 - 99 mg/dL    Comment: Glucose reference range applies only to samples taken after fasting for at least 8 hours.   BUN 20 8 - 23 mg/dL   Creatinine, Ser 1.27 (H) 0.44 - 1.00 mg/dL   Calcium 9.2 8.9 - 10.3 mg/dL   GFR, Estimated 48 (L) >60 mL/min    Comment: (NOTE) Calculated using the CKD-EPI Creatinine Equation (2021)    Anion gap 14 5 - 15    Comment: Performed at [facility], [address] [place], [place] [address]  CBC     Status: Abnormal   Collection Time: 02/03/22  5:19 PM  Result Value Ref Range   WBC 10.3 4.0 - 10.5 K/uL   RBC 4.88 3.87 - 5.11 MIL/uL   Hemoglobin 12.9 12.0 - 15.0 g/dL   HCT 42.0  36.0 - 46.0 %   MCV 86.1 80.0 - 100.0 fL   MCH 26.4 26.0 - 34.0 pg   MCHC 30.7 30.0 - 36.0 g/dL   RDW 15.7 (H) 11.5 - 15.5 %   Platelets 289 150 -  400 K/uL   nRBC 0.0 0.0 - 0.2 %    Comment: Performed at [facility], [place], [place] [address]  Troponin I (High Sensitivity)     Status: None   Collection Time: 02/03/22  5:19 PM  Result Value Ref Range   Troponin I (High Sensitivity) 8 <18 ng/L    Comment: (NOTE) Elevated high sensitivity troponin I (hsTnI) values and significant  changes across serial measurements may suggest ACS but many other  chronic and acute conditions are known to elevate hsTnI results.  Refer to the "Links" section for chest pain algorithms and additional  guidance. Performed at [facility], [place], [place] [address]   Troponin I (High Sensitivity)     Status: None   Collection Time: 02/03/22  7:05 PM  Result Value Ref Range   Troponin I (High Sensitivity) 8 <18 ng/L    Comment: (NOTE) Elevated high sensitivity troponin I (hsTnI) values and significant  changes across serial measurements may suggest ACS but many other  chronic and acute conditions are known to elevate hsTnI results.  Refer to the "Links" section for chest pain algorithms and additional  guidance. Performed at [facility], [address] [place], [place] [address]   Resp Panel by RT-PCR (Flu A&B, Covid) Nasopharyngeal Swab     Status: None   Collection Time: 02/03/22  8:47 PM   Specimen: Nasopharyngeal Swab; Nasopharyngeal(NP) swabs in vial transport medium  Result Value Ref Range   SARS Coronavirus 2 by RT PCR NEGATIVE NEGATIVE    Comment: (NOTE) SARS-CoV-2 target nucleic acids are NOT DETECTED.  The SARS-CoV-2 RNA is generally detectable in upper respiratory specimens during the acute phase of infection. The lowest concentration of SARS-CoV-2 viral copies this assay can detect is 138 copies/mL. A negative result  does not preclude SARS-Cov-2 infection and should not be used as the sole basis for treatment or other patient management decisions. A negative result may occur with  improper specimen collection/handling, submission of specimen other than nasopharyngeal swab, presence of viral mutation(s) within the areas targeted by this assay, and inadequate number of viral copies(<138 copies/mL). A negative result must be combined with clinical observations, patient history, and epidemiological information. The expected result is Negative.  Fact Sheet for Patients:  EntrepreneurPulse.com.au  Fact Sheet for Healthcare Providers:  IncredibleEmployment.be  This test is no t yet approved or cleared by the [place] FDA and  has been authorized for detection and/or diagnosis of SARS-CoV-2 by FDA under an Emergency Use Authorization (EUA). This EUA will remain  in effect (meaning this test can be used) for the duration of the COVID-19 declaration under Section 564(b)(1) of the Act, 21 U.S.C.section 360bbb-3(b)(1), unless the authorization is terminated  or revoked sooner.       Influenza A by PCR NEGATIVE NEGATIVE   Influenza B by PCR NEGATIVE NEGATIVE    Comment: (NOTE) The Xpert Xpress SARS-CoV-2/FLU/RSV plus assay is intended as an aid in the diagnosis of influenza from Nasopharyngeal swab specimens and should not be used as a sole basis for treatment. Nasal washings and aspirates are unacceptable for Xpert Xpress SARS-CoV-2/FLU/RSV testing.  Fact Sheet for Patients: EntrepreneurPulse.com.au  Fact Sheet for Healthcare Providers: IncredibleEmployment.be  This test is not yet approved or cleared by the [place] FDA and has been authorized for detection and/or diagnosis of SARS-CoV-2 by FDA under an Emergency Use Authorization (EUA). This EUA will remain in effect (meaning this test can be  used) for the duration  of the COVID-19 declaration under Section 564(b)(1) of the Act, 21 U.S.C. section 360bbb-3(b)(1), unless the authorization is terminated or revoked.  Performed at [facility], [address] [place], [place] [address]   HIV Antibody (routine testing w rflx)     Status: None   Collection Time: 02/04/22  7:32 AM  Result Value Ref Range   HIV Screen 4th Generation wRfx Non Reactive Non Reactive    Comment: Performed at [facility], [place] [address] [place], [place] [address]  Basic metabolic panel     Status: Abnormal   Collection Time: 02/04/22  7:32 AM  Result Value Ref Range   Sodium 137 135 - 145 mmol/L   Potassium 3.5 3.5 - 5.1 mmol/L   Chloride 104 98 - 111 mmol/L   CO2 21 (L) 22 - 32 mmol/L   Glucose, Bld 189 (H) 70 - 99 mg/dL    Comment: Glucose reference range applies only to samples taken after fasting for at least 8 hours.   BUN 22 8 - 23 mg/dL   Creatinine, Ser 1.04 (H) 0.44 - 1.00 mg/dL   Calcium 8.7 (L) 8.9 - 10.3 mg/dL   GFR, Estimated >60 >60 mL/min    Comment: (NOTE) Calculated using the CKD-EPI Creatinine Equation (2021)    Anion gap 12 5 - 15    Comment: Performed at [facility], [address] [place], [place] [address]  Magnesium     Status: None   Collection Time: 02/04/22  7:32 AM  Result Value Ref Range   Magnesium 2.0 1.7 - 2.4 mg/dL    Comment: Performed at [facility], [address] [place], [place] [address]  BUN     Status: None   Collection Time: 02/22/22 10:18 AM  Result Value Ref Range   BUN 17 8 - 23 mg/dL    Comment: Performed at [facility], [address] [place], [place] [address]  Creatinine, serum     Status: Abnormal   Collection Time: 02/22/22 10:18 AM  Result Value Ref Range   Creatinine, Ser 1.10 (H) 0.44 - 1.00 mg/dL   GFR, Estimated 57 (L) >60 mL/min    Comment: (NOTE) Calculated using the CKD-EPI Creatinine Equation (2021) Performed at [facility], [address] [place], [place] [address]   BUN     Status: None   Collection Time: 03/13/22  9:58 AM  Result Value Ref Range   BUN 12 8 - 23 mg/dL    Comment: Performed at [facility], [address] [place], [place] [address]  Creatinine, serum     Status: Abnormal   Collection Time: 03/13/22  9:58 AM  Result Value Ref Range   Creatinine, Ser 1.16 (H) 0.44 - 1.00 mg/dL   GFR, Estimated 53 (L) >60 mL/min    Comment: (NOTE) Calculated using the CKD-EPI Creatinine Equation (2021) Performed at [facility], [address], [place], [place] [address]     Radiology PERIPHERAL VASCULAR CATHETERIZATION  Result Date: 03/13/2022 See surgical note for result.  PERIPHERAL VASCULAR CATHETERIZATION  Result Date: 02/22/2022 See surgical note for result.  VAS [place] ABI WITH/WO TBI  Result Date: 03/09/2022  LOWER EXTREMITY DOPPLER STUDY Patient Name:  Carla [name]  Date of Exam:   03/07/2022 Medical Rec #: [id number]      Accession #:    [id number] Date of Birth: 01-04-1961       Patient Gender: F Patient  Age:   49 years Exam Location:  [facility] Procedure:      VAS [place] ABI WITH/WO TBI Referring Phys: Arna Medici BROWN --------------------------------------------------------------------------------  Indications: LLE pain.  Vascular Interventions: 12/13/20: right cia,eia thrombectomy                         01/10/21: rt thrombectomy new bilat kissing stents in                         cia's, addtl rt eia stent;                         01/24/21: repeat thrombectomy right;                         06/29/21: Right CIA/EIA thrombectomy with bilateral CIA                         PTAs;                         11/28/21: Left CIA/EIA thrombectomy/PTA;                         12/01/2021: Left CFA,PFA and SFA Endartectomies and                         patch. Performing Technologist: Blondell Reveal RT, RDMS, RVT  Examination Guidelines: A complete evaluation includes at minimum, Doppler waveform signals and  systolic blood pressure reading at the level of bilateral brachial, anterior tibial, and posterior tibial arteries, when vessel segments are accessible. Bilateral testing is considered an integral part of a complete examination. Photoelectric Plethysmograph (PPG) waveforms and toe systolic pressure readings are included as required and additional duplex testing as needed. Limited examinations for reoccurring indications may be performed as noted.  ABI Findings: +---------+------------------+-----+------------+--------+ Right    Rt Pressure (mmHg)IndexWaveform    Comment  +---------+------------------+-----+------------+--------+ Brachial 136                                         +---------+------------------+-----+------------+--------+ ATA      64                0.46 monophasic           +---------+------------------+-----+------------+--------+ PTA                             not detected         +---------+------------------+-----+------------+--------+ PERO     62                0.45 monophasic           +---------+------------------+-----+------------+--------+ Great Toe40                0.29 Abnormal             +---------+------------------+-----+------------+--------+ +---------+------------------+-----+----------+-------+ Left     Lt Pressure (mmHg)IndexWaveform  Comment +---------+------------------+-----+----------+-------+ Brachial 139                                      +---------+------------------+-----+----------+-------+  ATA      103               0.74 monophasic        +---------+------------------+-----+----------+-------+ PTA      67                0.48 monophasic        +---------+------------------+-----+----------+-------+ Great Toe58                0.42 Normal            +---------+------------------+-----+----------+-------+ +-------+-----------+-----------+------------+------------+ ABI/TBIToday's ABIToday's  TBIPrevious ABIPrevious TBI +-------+-----------+-----------+------------+------------+ Right  0.46       0.29       0.63        0.61         +-------+-----------+-----------+------------+------------+ Left   0.74       0.42       0.75        0.75         +-------+-----------+-----------+------------+------------+ Right ABIs appear decreased compared to prior study on 02/15/22. Left ABIs appear essentially unchanged.  Summary: Right: Resting right ankle-brachial index indicates severe right lower extremity arterial disease. The right toe-brachial index is abnormal. Left: Resting left ankle-brachial index indicates moderate left lower extremity arterial disease. The left toe-brachial index is abnormal. *See table(s) above for measurements and observations.  Electronically signed by [name] MD on 03/09/2022 at 12:24:25 PM.    Final     Assessment/Plan Essential hypertension with goal blood pressure less than 140/90 blood pressure control important in reducing the progression of atherosclerotic disease. On appropriate oral medications.     Prediabetes blood glucose control important in reducing the progression of atherosclerotic disease. Also, involved in wound healing. On appropriate medications.     Hyperlipidemia lipid control important in reducing the progression of atherosclerotic disease. 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  This could be contributing to her rectal bleeding although I am not sure.  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 This is an incredibly challenging and difficult situation.  She has an ischemic leg with what sounds like a relatively  active GI bleed.  We have arranged for her to be admitted to the hospitalist service for GI work-up.  She will likely need another right lower extremity angiogram although I am very concerned about the durability of any intervention as it has failed even on anticoagulation recently.  I have discussed with her that we need to strongly consider proceeding with amputation if we have another failure.  If we continue to perform interventions and anticoagulate her if she has bleeding issues, that may be a life-threatening situation and not just a limb threatening situation.  The patient is tearful but understands the gravity of the situation.  We will be following her in the hospital and at some point we will plan a angiogram with possible revascularization after her GI work-up.    [name], MD  03/21/2022 5:30 PM    This note was created with Dragon medical transcription system.  Any errors from dictation are purely unintentional

## 2022-03-21 NOTE — Patient Instructions (Signed)
Peripheral Vascular Disease  Peripheral vascular disease (PVD) is a disease of the blood vessels that carry blood from the heart to the rest of the body. PVD is also called peripheral artery disease (PAD) or poor circulation. PVD affects most of the body. But it affects the legs and feet the most. PVD can lead to acute limb ischemia. This happens when there is a sudden stop of blood flow to an arm or leg. This is a medical emergency. What are the causes? The most common cause of PVD is a buildup of a fatty substance (plaque) inside your arteries. This decreases blood flow. Plaque can break off and block blood in a smaller artery. This can lead to acute limb ischemia. Other common causes of PVD include: Blood clots inside the blood vessels. Injuries to blood vessels. Irritation and swelling of blood vessels. Sudden tightening of the blood vessel (spasms). What increases the risk? A family history of PVD. Medical conditions, including: High cholesterol. Diabetes. High blood pressure. Heart disease. Past problems with blood clots. Past injury, such as burns or a broken bone. Other conditions, such as: Buerger's disease. This is caused by swollen or irritated blood vessels in your hands and feet. Arthritis. Birth defects that affect the arteries in your legs. Kidney disease. Using tobacco or nicotine products. Not getting enough exercise. Being very overweight (obese). Being 50 years old or older. What are the signs or symptoms? Cramps in your butt, legs, and feet. Pain and weakness in your legs when you are active that goes away when you rest. Leg pain when at rest. Leg numbness, tingling, or weakness. Coldness in a leg or foot, especially when compared with the other leg or foot. Skin or hair changes. These can include: Hair loss. Shiny skin. Pale or bluish skin. Thick toenails. Being unable to get or keep an erection. Tiredness (fatigue). Weak pulse or no pulse in the  feet. Wounds and sores on the toes, feet, or legs. These take longer to heal. How is this treated? Underlying causes are treated first. Other conditions, like diabetes, high cholesterol, and blood pressure, are also treated. Treatment may include: Lifestyle changes, such as: Quitting smoking. Getting regular exercise. Having a diet low in fat and cholesterol. Not drinking alcohol. Taking medicines, such as: Blood thinners. Medicines to improve blood flow. Medicines to improve your blood cholesterol. Procedures to: Open the arteries and restore blood flow. Insert a small mesh tube (stent) to keep a blocked vessel open. Create a new path for blood to flow to the body (peripheral bypass). Remove dead tissue from a wound. Remove an affected leg or arm. Follow these instructions at home: Medicines Take over-the-counter and prescription medicines only as told by your doctor. If you are taking blood thinners: Talk with your doctor before you take any medicines that have aspirin, or NSAIDs, such as ibuprofen. Take medicines exactly as told. Take them at the same time each day. Avoid doing things that could hurt or bruise you. Take action to prevent falls. Wear an alert bracelet or carry a card that shows you are taking blood thinners. Lifestyle     Get regular exercise. Ask your doctor about how to stay active. Talk with your doctor about keeping a healthy weight. If needed, ask about losing weight. Eat a diet that is low in fat and cholesterol. If you need help, talk with your doctor. Do not drink alcohol. Do not smoke or use any products that contain nicotine or tobacco. If you need help   quitting, ask your doctor. General instructions Take good care of your feet. To do this: Wear shoes that fit well and feel good. Check your feet often for any cuts or sores. Get a flu shot (influenza vaccine) each year. Keep all follow-up visits. Where to find more information Society for  Vascular Surgery: vascular.org American Heart Association: heart.org National Heart, Lung, and Blood Institute: nhlbi.nih.gov Contact a doctor if: You have cramps in your legs when you walk. You have leg pain when you rest. Your leg or foot feels cold. Your skin changes. You cannot get or keep an erection. You have cuts or sores on your legs or feet that do not heal. Get help right away if: You have sudden changes in the color and feeling of your arms or legs, such as: Your arm or leg turns cold, numb, and blue. Your arm or leg becomes red, warm, swollen, painful, or numb. You have any signs of a stroke. "BE FAST" is an easy way to remember the main warning signs: B - Balance. Dizziness, sudden trouble walking, or loss of balance. E - Eyes. Trouble seeing or a change in how you see. F - Face. Sudden weakness or loss of feeling of the face. The face or eyelid may droop on one side. A - Arms. Weakness or loss of feeling in an arm. This happens all of a sudden and most often on one side of the body. S - Speech. Sudden trouble speaking, slurred speech, or trouble understanding what people say. T - Time. Time to call emergency services. Write down what time symptoms started. You have other signs of a stroke, such as: A sudden, very bad headache with no known cause. Feeling like you may vomit (nausea). Vomiting. A seizure. You have chest pain or trouble breathing. These symptoms may be an emergency. Get help right away. Call your local emergency services (911 in the [place] Do not wait to see if the symptoms will go away. Do not drive yourself to the hospital. Summary Peripheral vascular disease (PVD) is a disease of the blood vessels. PVD affects the legs and feet the most. Symptoms may include leg pain or leg numbness, tingling, and weakness. Treatment may include lifestyle changes, medicines, and procedures. This information is not intended to replace advice given to you by your health  care provider. Make sure you discuss any questions you have with your health care provider. Document Revised: 04/12/2020 Document Reviewed: 04/12/2020 Elsevier Patient Education  2023 [employer]  

## 2022-03-21 NOTE — H&P (View-Only) (Signed)
MRN : [id number]  Carla [name] is a 61 y.o. ([birth year]) female who presents with chief complaint of  Chief Complaint  Patient presents with   Follow-up    Ultrasound follow up   .  History of Present Illness: Patient returns today in follow up of her peripheral arterial disease.  She has developed right leg rest pain again despite an intervention last week for revascularization.  This has had early reocclusion several times despite appropriate anticoagulation.  She is also having a complicating feature of significant rectal bleeding.  This dramatically complicates her situation and precludes [place] from using tPA.  She has a litany of medical issues including a very large ventral hernia which has been unable to be repaired.  She had a heart attack and an ischemic limb the last time we stopped her anticoagulation to repair her ventral hernia. 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  No current facility-administered medications for this visit.   No current outpatient medications on file.   Facility-Administered Medications Ordered in Other Visits  Medication Dose Route Frequency Provider Last Rate Last Admin   0.9 %  sodium chloride infusion   Intravenous Continuous Agbata, Tochukwu, MD       acetaminophen (TYLENOL) tablet 650 mg  650 mg Oral Q6H PRN Agbata, Tochukwu, MD       Or   acetaminophen (TYLENOL) suppository 650 mg  650 mg Rectal Q6H PRN Agbata, Tochukwu, MD       atorvastatin (LIPITOR) tablet 80 mg  80 mg Oral QHS Agbata, Tochukwu, MD       [START ON 03/22/2022] citalopram (CELEXA) tablet 40 mg  40 mg Oral Daily Agbata, Tochukwu, MD       iohexol (OMNIPAQUE) 9 MG/ML oral solution 500 mL  500 mL Oral Once PRN Agbata, Tochukwu, MD       ipratropium-albuterol (DUONEB) 0.5-2.5 (3) MG/3ML nebulizer solution 3 mL  3 mL Nebulization Daily PRN Agbata, Tochukwu, MD        [START ON 03/22/2022] isosorbide mononitrate (IMDUR) 24 hr tablet 60 mg  60 mg Oral Daily Agbata, Tochukwu, MD       metoprolol tartrate (LOPRESSOR) tablet 50 mg  50 mg Oral BID Agbata, Tochukwu, MD       morphine (PF) 2 MG/ML injection 1 mg  1 mg Intravenous Q4H PRN Agbata, Tochukwu, MD       nitroGLYCERIN (NITROSTAT) SL tablet 0.4 mg  0.4 mg Sublingual Q5 min PRN Agbata, Tochukwu, MD       ondansetron (ZOFRAN) tablet 4 mg  4 mg Oral Q6H PRN Agbata, Tochukwu, MD       Or   ondansetron (ZOFRAN) injection 4 mg  4 mg Intravenous Q6H PRN Agbata, Tochukwu, MD       pantoprazole (PROTONIX) injection 40 mg  40 mg Intravenous Q24H Agbata, Tochukwu, MD       pregabalin (LYRICA) capsule 100 mg  100 mg Oral TID Collier Bullock, MD        Past Medical History:  Diagnosis Date   Abdominal aortic atherosclerosis ([place])    a. 05/2017 CTA abd/pelvis: significant atherosclerotic dzs of the infrarenal abd Ao w/ some mural thrombus. No aneurysm or dissection.   Acute focal ischemia of small intestine (HCC)    Acute right-sided low back pain with right-sided sciatica 06/13/2017   AKI (acute kidney injury) ([place]) 07/29/2017   Anginal  pain ([place])    a. 08/2012 Lexiscan MV: EF 54%, non ischemia/infarct.   Anxiety and depression    Baker's cyst of knee, right    a. 07/2016 U/S: 4.1 x 1.4 x 2.9 cystic structure in R poplitetal fossa.   Bell's palsy    Borderline diabetes    CHF (congestive heart failure) (HCC)    Chronic anticoagulation    apixaban   Chronic idiopathic constipation    Chronic mesenteric ischemia (HCC)    Chronic pain    COPD (chronic obstructive pulmonary disease) (HCC)    Diastolic dysfunction    a. 05/2017 Echo: Ef 60-65%, no rwma, Gr1 DD, no source of cardiac emboli.   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 05/2017 CTA Abd/pelvis: apparent thrombus or embolus in prox SMA (70-90%); b. 05/2017 catheter directed tPA, mechanical thrombectomy, and stenting of the SMA.    Essential hypertension with goal blood pressure less than 140/90 01/19/2016   Gastroesophageal reflux disease 01/19/2016   GERD (gastroesophageal reflux disease)    H/O colectomy    History of 2019 novel coronavirus disease (COVID-19) 11/14/2019   History of kidney stones    Hyperlipidemia    Hypertension    Morbid obesity with BMI of 40.0-44.9, adult ([place])    Occlusive mesenteric ischemia ([place]) 06/19/2017   Osteoarthritis    PAD (peripheral artery disease) (HCC)    PAF (paroxysmal atrial fibrillation) (HCC)    Palpitations    Peripheral edema    Personal history of other malignant neoplasm of skin 06/01/2016   Pneumonia    Pre-diabetes    Primary osteoarthritis of both knees 06/13/2017   SBO (small bowel obstruction) ([place]) 08/06/2017   Skin cancer of nose    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HCC)    Vitamin D deficiency     Past Surgical History:  Procedure Laterality Date   AMPUTATION TOE Left 12/09/2019   Procedure: AMPUTATION TOE MPJ left;  Surgeon: Caroline More, DPM;  Location: [facility];  Service: Podiatry;  Laterality: Left;   APPENDECTOMY     APPLICATION OF WOUND VAC  12/01/2021   Procedure: APPLICATION OF WOUND VAC;  Surgeon: Algernon Huxley, MD;  Location: [facility];  Service: Vascular;;   CHOLECYSTECTOMY     COLON SURGERY     EMBOLECTOMY OR THROMBECTOMY, WITH OR WITHOUT CATHETER; FEMOROPOPLITEAL, AORTOILIAC ARTERY, BY LEG INCISION N/A 06/21/2020   Left - Decomp Fasciotomy Leg; Ant &/Or Lat Compart Only; Midline - Angiography, Visceral, Selective Or Supraselective (With Or Without Flush Aortogram); Left - Revascularize, Endovasc, Open/Percut, Iliac Artery, Unilat, Initial Vessel; W/Translum Stent, W/Angioplasty; Right - Revascularize, Endovasc, Open/Percut, Iliac Artery, Ea Add`L Ipsilateral; W/Translumin Stent, W/Angioplasty; Location [facility];   ENDARTERECTOMY FEMORAL Left 12/01/2021   Procedure: ENDARTERECTOMY FEMORAL;  Surgeon: Algernon Huxley, MD;  Location: [facility];   Service: Vascular;  Laterality: Left;   ENDOVASCULAR REPAIR/STENT GRAFT Left 12/01/2021   Procedure: ENDOVASCULAR REPAIR/STENT GRAFT;  Surgeon: Algernon Huxley, MD;  Location: [place] [facility];  Service: Cardiovascular;  Laterality: Left;   LAPAROTOMY N/A 08/08/2017   Procedure: EXPLORATORY LAPAROTOMY POSSIBLE BOWEL RESECTION;  Surgeon: Jules Husbands, MD;  Location: [facility];  Service: General;  Laterality: N/A;   LEFT HEART CATH AND CORONARY ANGIOGRAPHY N/A 05/17/2021   Procedure: LEFT HEART CATH AND CORONARY ANGIOGRAPHY;  Surgeon: Yolonda Kida, MD;  Location: [place] [facility];  Service: Cardiovascular;  Laterality: N/A;   LOWER EXTREMITY ANGIOGRAPHY Left 11/17/2019  Procedure: LOWER EXTREMITY ANGIOGRAPHY;  Surgeon: Algernon Huxley, MD;  Location: [place] [facility];  Service: Cardiovascular;  Laterality: Left;   LOWER EXTREMITY ANGIOGRAPHY Right 12/13/2020   Procedure: LOWER EXTREMITY ANGIOGRAPHY;  Surgeon: Algernon Huxley, MD;  Location: [place] [facility];  Service: Cardiovascular;  Laterality: Right;   LOWER EXTREMITY ANGIOGRAPHY Right 01/10/2021   Procedure: LOWER EXTREMITY ANGIOGRAPHY;  Surgeon: Algernon Huxley, MD;  Location: [place] [facility];  Service: Cardiovascular;  Laterality: Right;   LOWER EXTREMITY ANGIOGRAPHY Right 01/24/2021   Procedure: LOWER EXTREMITY ANGIOGRAPHY;  Surgeon: Algernon Huxley, MD;  Location: [place] [facility];  Service: Cardiovascular;  Laterality: Right;   LOWER EXTREMITY ANGIOGRAPHY Bilateral 06/28/2021   Procedure: Lower Extremity Angiography;  Surgeon: Algernon Huxley, MD;  Location: [place] [facility];  Service: Cardiovascular;  Laterality: Bilateral;   LOWER EXTREMITY ANGIOGRAPHY Right 11/28/2021   Procedure: LOWER EXTREMITY ANGIOGRAPHY;  Surgeon: Algernon Huxley, MD;  Location: [place] [facility];  Service: Cardiovascular;  Laterality: Right;   LOWER EXTREMITY ANGIOGRAPHY Left 11/30/2021   Procedure: Lower Extremity Angiography;  Surgeon: Algernon Huxley,  MD;  Location: [place] [facility];  Service: Cardiovascular;  Laterality: Left;   LOWER EXTREMITY ANGIOGRAPHY Right 02/22/2022   Procedure: Lower Extremity Angiography;  Surgeon: Algernon Huxley, MD;  Location: [place] [facility];  Service: Cardiovascular;  Laterality: Right;   LOWER EXTREMITY ANGIOGRAPHY Right 03/13/2022   Procedure: Lower Extremity Angiography;  Surgeon: Algernon Huxley, MD;  Location: [place] [facility];  Service: Cardiovascular;  Laterality: Right;   LOWER EXTREMITY INTERVENTION N/A 11/19/2019   Procedure: LOWER EXTREMITY INTERVENTION;  Surgeon: Algernon Huxley, MD;  Location: [place] [facility];  Service: Cardiovascular;  Laterality: N/A;   LOWER EXTREMITY INTERVENTION Bilateral 06/29/2021   Procedure: LOWER EXTREMITY INTERVENTION;  Surgeon: Algernon Huxley, MD;  Location: [place] [facility];  Service: Cardiovascular;  Laterality: Bilateral;   TEE WITHOUT CARDIOVERSION N/A 06/22/2017   Procedure: TRANSESOPHAGEAL ECHOCARDIOGRAM (TEE);  Surgeon: Wellington Hampshire, MD;  Location: [facility];  Service: Cardiovascular;  Laterality: N/A;   TOTAL KNEE ARTHROPLASTY Right 07/11/2018   Procedure: TOTAL KNEE ARTHROPLASTY;  Surgeon: Corky Mull, MD;  Location: [facility];  Service: Orthopedics;  Laterality: Right;   VAGINAL HYSTERECTOMY     VISCERAL ARTERY INTERVENTION N/A 06/20/2017   Procedure: Visceral Artery Intervention, possible aortic thrombectomy;  Surgeon: Algernon Huxley, MD;  Location: [place] [facility];  Service: Cardiovascular;  Laterality: N/A;   VISCERAL ARTERY INTERVENTION N/A 01/28/2018   Procedure: VISCERAL ARTERY INTERVENTION;  Surgeon: Algernon Huxley, MD;  Location: [facility] [facility];  Service: Cardiovascular;  Laterality: N/A;     Social History   Tobacco Use   Smoking status: Former    Packs/day: 1.00    Types: Cigarettes   Smokeless tobacco: Never  Vaping Use   Vaping Use: Never used  Substance Use Topics   Alcohol use: No   Drug use: No      Family  History  Problem Relation Age of Onset   Hypertension Mother    Heart disease Mother    Heart attack Mother    Breast cancer Mother    Hypertension Father    Breast cancer Paternal Aunt      Allergies  Allergen Reactions   Gabapentin     Tremors    Bactrim [Sulfamethoxazole-Trimethoprim] Itching    REVIEW OF SYSTEMS (Negative unless checked)   Constitutional: '[]'$ Weight loss  '[]'$   Fever  '[]'$ Chills Cardiac: '[]'$ Chest pain   '[]'$ Chest pressure   '[]'$ Palpitations   '[]'$ Shortness of breath when laying flat   '[]'$ Shortness of breath at rest   '[]'$ Shortness of breath with exertion. Vascular:  '[]'$ Pain in legs with walking   '[]'$ Pain in legs at rest   '[]'$ Pain in legs when laying flat   '[]'$ Claudication   '[x]'$ Pain in feet when walking  '[x]'$ Pain in feet at rest  '[x]'$ Pain in feet when laying flat   '[]'$ History of DVT   '[]'$ Phlebitis   '[]'$ Swelling in legs   '[]'$ Varicose veins   '[]'$ Non-healing ulcers Pulmonary:   '[]'$ Uses home oxygen   '[]'$ Productive cough   '[]'$ Hemoptysis   '[]'$ Wheeze  '[]'$ COPD   '[]'$ Asthma Neurologic:  '[]'$ Dizziness  '[]'$ Blackouts   '[]'$ Seizures   '[]'$ History of stroke   '[]'$ History of TIA  '[]'$ Aphasia   '[]'$ Temporary blindness   '[]'$ Dysphagia   '[]'$ Weakness or numbness in arms   '[x]'$ Weakness or numbness in legs Musculoskeletal:  '[]'$ Arthritis   '[]'$ Joint swelling   '[]'$ Joint pain   '[]'$ Low back pain Hematologic:  '[]'$ Easy bruising  '[]'$ Easy bleeding   '[x]'$ Hypercoagulable state   '[]'$ Anemic   Gastrointestinal:  '[]'$ Blood in stool   '[]'$ Vomiting blood  '[]'$ Gastroesophageal reflux/heartburn   '[]'$ Abdominal pain Genitourinary:  '[]'$ Chronic kidney disease   '[]'$ Difficult urination  '[]'$ Frequent urination  '[]'$ Burning with urination   '[]'$ Hematuria Skin:  '[]'$ Rashes   '[]'$ Ulcers   '[]'$ Wounds Psychological:  '[]'$ History of anxiety   '[]'$  History of major depression.  Physical Examination  BP (!) 154/101 (BP Location: Left Arm)   Pulse 94   Resp 16  Gen:  WD/WN, NAD.  Obese Head: [place]/AT, No temporalis wasting. Ear/Nose/Throat: Hearing grossly intact, nares w/o erythema or  drainage Eyes: Conjunctiva clear. Sclera non-icteric Neck: Supple.  Trachea midline Pulmonary:  Good air movement, no use of accessory muscles.  Cardiac: RRR, no JVD Vascular:  Vessel Right Left  Radial Palpable Palpable                          PT 1+ palpable 2+ palpable  DP Not palpable 1+ palpable   Gastrointestinal: soft, non-tender/non-distended. No guarding/reflex.  Massive ventral hernia Musculoskeletal: M/S 5/5 throughout.  No deformity or atrophy.  Trace lower extremity edema. Neurologic: Sensation grossly intact in extremities.  Symmetrical.  Speech is fluent.  Psychiatric: Judgment intact, Mood & affect appropriate for pt's clinical situation.  Tearful Dermatologic: No rashes or ulcers noted.  No cellulitis or open wounds.      Labs Recent Results (from the past 2160 hour(s))  I-STAT creatinine     Status: None   Collection Time: 01/04/22 11:19 AM  Result Value Ref Range   Creatinine, Ser 1.00 0.44 - 1.00 mg/dL  Comprehensive metabolic panel     Status: Abnormal   Collection Time: 01/18/22  4:46 PM  Result Value Ref Range   Sodium 136 135 - 145 mmol/L   Potassium 3.2 (L) 3.5 - 5.1 mmol/L   Chloride 104 98 - 111 mmol/L   CO2 23 22 - 32 mmol/L   Glucose, Bld 103 (H) 70 - 99 mg/dL    Comment: Glucose reference range applies only to samples taken after fasting for at least 8 hours.   BUN 13 8 - 23 mg/dL   Creatinine, Ser 1.01 (H) 0.44 - 1.00 mg/dL   Calcium 8.8 (L) 8.9 - 10.3 mg/dL   Total Protein 7.5 6.5 - 8.1 g/dL   Albumin 3.7 3.5 - 5.0 g/dL   AST  19 15 - 41 U/L   ALT 17 0 - 44 U/L   Alkaline Phosphatase 93 38 - 126 U/L   Total Bilirubin 0.7 0.3 - 1.2 mg/dL   GFR, Estimated >60 >60 mL/min    Comment: (NOTE) Calculated using the CKD-EPI Creatinine Equation (2021)    Anion gap 9 5 - 15    Comment: Performed at [facility], [address] [place], [place] [address]  CBC with Differential/Platelet     Status: Abnormal   Collection Time:  01/18/22  4:46 PM  Result Value Ref Range   WBC 7.3 4.0 - 10.5 K/uL   RBC 4.26 3.87 - 5.11 MIL/uL   Hemoglobin 11.6 (L) 12.0 - 15.0 g/dL   HCT 37.5 36.0 - 46.0 %   MCV 88.0 80.0 - 100.0 fL   MCH 27.2 26.0 - 34.0 pg   MCHC 30.9 30.0 - 36.0 g/dL   RDW 15.2 11.5 - 15.5 %   Platelets 375 150 - 400 K/uL   nRBC 0.0 0.0 - 0.2 %   Neutrophils Relative % 56 %   Neutro Abs 4.1 1.7 - 7.7 K/uL   Lymphocytes Relative 35 %   Lymphs Abs 2.5 0.7 - 4.0 K/uL   Monocytes Relative 7 %   Monocytes Absolute 0.5 0.1 - 1.0 K/uL   Eosinophils Relative 1 %   Eosinophils Absolute 0.1 0.0 - 0.5 K/uL   Basophils Relative 1 %   Basophils Absolute 0.1 0.0 - 0.1 K/uL   Immature Granulocytes 0 %   Abs Immature Granulocytes 0.02 0.00 - 0.07 K/uL    Comment: Performed at [facility], [address] [place], [place] [address]  Brain natriuretic peptide     Status: None   Collection Time: 01/18/22  4:46 PM  Result Value Ref Range   B Natriuretic Peptide 29.0 0.0 - 100.0 pg/mL    Comment: Performed at [facility], [place], [place] [address]  Troponin I (High Sensitivity)     Status: None   Collection Time: 01/18/22  4:46 PM  Result Value Ref Range   Troponin I (High Sensitivity) 5 <18 ng/L    Comment: (NOTE) Elevated high sensitivity troponin I (hsTnI) values and significant  changes across serial measurements may suggest ACS but many other  chronic and acute conditions are known to elevate hsTnI results.  Refer to the "Links" section for chest pain algorithms and additional  guidance. Performed at [facility], [address] [place], [place] [address]   Resp Panel by RT-PCR (Flu A&B, Covid) Nasopharyngeal Swab     Status: None   Collection Time: 01/18/22  4:46 PM   Specimen: Nasopharyngeal Swab; Nasopharyngeal(NP) swabs in vial transport medium  Result Value Ref Range   SARS Coronavirus 2 by RT PCR NEGATIVE NEGATIVE    Comment: (NOTE) SARS-CoV-2 target  nucleic acids are NOT DETECTED.  The SARS-CoV-2 RNA is generally detectable in upper respiratory specimens during the acute phase of infection. The lowest concentration of SARS-CoV-2 viral copies this assay can detect is 138 copies/mL. A negative result does not preclude SARS-Cov-2 infection and should not be used as the sole basis for treatment or other patient management decisions. A negative result may occur with  improper specimen collection/handling, submission of specimen other than nasopharyngeal swab, presence of viral mutation(s) within the areas targeted by this assay, and inadequate number of viral copies(<138 copies/mL). A negative result must be combined with clinical observations, patient history, and epidemiological information.  The expected result is Negative.  Fact Sheet for Patients:  EntrepreneurPulse.com.au  Fact Sheet for Healthcare Providers:  IncredibleEmployment.be  This test is no t yet approved or cleared by the [place] FDA and  has been authorized for detection and/or diagnosis of SARS-CoV-2 by FDA under an Emergency Use Authorization (EUA). This EUA will remain  in effect (meaning this test can be used) for the duration of the COVID-19 declaration under Section 564(b)(1) of the Act, 21 U.S.C.section 360bbb-3(b)(1), unless the authorization is terminated  or revoked sooner.       Influenza A by PCR NEGATIVE NEGATIVE   Influenza B by PCR NEGATIVE NEGATIVE    Comment: (NOTE) The Xpert Xpress SARS-CoV-2/FLU/RSV plus assay is intended as an aid in the diagnosis of influenza from Nasopharyngeal swab specimens and should not be used as a sole basis for treatment. Nasal washings and aspirates are unacceptable for Xpert Xpress SARS-CoV-2/FLU/RSV testing.  Fact Sheet for Patients: EntrepreneurPulse.com.au  Fact Sheet for Healthcare Providers: IncredibleEmployment.be  This test  is not yet approved or cleared by the [place] FDA and has been authorized for detection and/or diagnosis of SARS-CoV-2 by FDA under an Emergency Use Authorization (EUA). This EUA will remain in effect (meaning this test can be used) for the duration of the COVID-19 declaration under Section 564(b)(1) of the Act, 21 U.S.C. section 360bbb-3(b)(1), unless the authorization is terminated or revoked.  Performed at [facility], [place], [place] [address]   Troponin I (High Sensitivity)     Status: None   Collection Time: 01/18/22  7:01 PM  Result Value Ref Range   Troponin I (High Sensitivity) 5 <18 ng/L    Comment: (NOTE) Elevated high sensitivity troponin I (hsTnI) values and significant  changes across serial measurements may suggest ACS but many other  chronic and acute conditions are known to elevate hsTnI results.  Refer to the "Links" section for chest pain algorithms and additional  guidance. Performed at [facility], [address] [place], [place] [address]   Basic metabolic panel     Status: Abnormal   Collection Time: 02/03/22  5:19 PM  Result Value Ref Range   Sodium 138 135 - 145 mmol/L   Potassium 3.1 (L) 3.5 - 5.1 mmol/L   Chloride 98 98 - 111 mmol/L   CO2 26 22 - 32 mmol/L   Glucose, Bld 129 (H) 70 - 99 mg/dL    Comment: Glucose reference range applies only to samples taken after fasting for at least 8 hours.   BUN 20 8 - 23 mg/dL   Creatinine, Ser 1.27 (H) 0.44 - 1.00 mg/dL   Calcium 9.2 8.9 - 10.3 mg/dL   GFR, Estimated 48 (L) >60 mL/min    Comment: (NOTE) Calculated using the CKD-EPI Creatinine Equation (2021)    Anion gap 14 5 - 15    Comment: Performed at [facility], [address] [place], [place] [address]  CBC     Status: Abnormal   Collection Time: 02/03/22  5:19 PM  Result Value Ref Range   WBC 10.3 4.0 - 10.5 K/uL   RBC 4.88 3.87 - 5.11 MIL/uL   Hemoglobin 12.9 12.0 - 15.0 g/dL   HCT 42.0  36.0 - 46.0 %   MCV 86.1 80.0 - 100.0 fL   MCH 26.4 26.0 - 34.0 pg   MCHC 30.7 30.0 - 36.0 g/dL   RDW 15.7 (H) 11.5 - 15.5 %   Platelets 289 150 -  400 K/uL   nRBC 0.0 0.0 - 0.2 %    Comment: Performed at [facility], [place], [place] [address]  Troponin I (High Sensitivity)     Status: None   Collection Time: 02/03/22  5:19 PM  Result Value Ref Range   Troponin I (High Sensitivity) 8 <18 ng/L    Comment: (NOTE) Elevated high sensitivity troponin I (hsTnI) values and significant  changes across serial measurements may suggest ACS but many other  chronic and acute conditions are known to elevate hsTnI results.  Refer to the "Links" section for chest pain algorithms and additional  guidance. Performed at [facility], [place], [place] [address]   Troponin I (High Sensitivity)     Status: None   Collection Time: 02/03/22  7:05 PM  Result Value Ref Range   Troponin I (High Sensitivity) 8 <18 ng/L    Comment: (NOTE) Elevated high sensitivity troponin I (hsTnI) values and significant  changes across serial measurements may suggest ACS but many other  chronic and acute conditions are known to elevate hsTnI results.  Refer to the "Links" section for chest pain algorithms and additional  guidance. Performed at [facility], [address] [place], [place] [address]   Resp Panel by RT-PCR (Flu A&B, Covid) Nasopharyngeal Swab     Status: None   Collection Time: 02/03/22  8:47 PM   Specimen: Nasopharyngeal Swab; Nasopharyngeal(NP) swabs in vial transport medium  Result Value Ref Range   SARS Coronavirus 2 by RT PCR NEGATIVE NEGATIVE    Comment: (NOTE) SARS-CoV-2 target nucleic acids are NOT DETECTED.  The SARS-CoV-2 RNA is generally detectable in upper respiratory specimens during the acute phase of infection. The lowest concentration of SARS-CoV-2 viral copies this assay can detect is 138 copies/mL. A negative result  does not preclude SARS-Cov-2 infection and should not be used as the sole basis for treatment or other patient management decisions. A negative result may occur with  improper specimen collection/handling, submission of specimen other than nasopharyngeal swab, presence of viral mutation(s) within the areas targeted by this assay, and inadequate number of viral copies(<138 copies/mL). A negative result must be combined with clinical observations, patient history, and epidemiological information. The expected result is Negative.  Fact Sheet for Patients:  EntrepreneurPulse.com.au  Fact Sheet for Healthcare Providers:  IncredibleEmployment.be  This test is no t yet approved or cleared by the [place] FDA and  has been authorized for detection and/or diagnosis of SARS-CoV-2 by FDA under an Emergency Use Authorization (EUA). This EUA will remain  in effect (meaning this test can be used) for the duration of the COVID-19 declaration under Section 564(b)(1) of the Act, 21 U.S.C.section 360bbb-3(b)(1), unless the authorization is terminated  or revoked sooner.       Influenza A by PCR NEGATIVE NEGATIVE   Influenza B by PCR NEGATIVE NEGATIVE    Comment: (NOTE) The Xpert Xpress SARS-CoV-2/FLU/RSV plus assay is intended as an aid in the diagnosis of influenza from Nasopharyngeal swab specimens and should not be used as a sole basis for treatment. Nasal washings and aspirates are unacceptable for Xpert Xpress SARS-CoV-2/FLU/RSV testing.  Fact Sheet for Patients: EntrepreneurPulse.com.au  Fact Sheet for Healthcare Providers: IncredibleEmployment.be  This test is not yet approved or cleared by the [place] FDA and has been authorized for detection and/or diagnosis of SARS-CoV-2 by FDA under an Emergency Use Authorization (EUA). This EUA will remain in effect (meaning this test can be  used) for the duration  of the COVID-19 declaration under Section 564(b)(1) of the Act, 21 U.S.C. section 360bbb-3(b)(1), unless the authorization is terminated or revoked.  Performed at [facility], [address] [place], [place] [address]   HIV Antibody (routine testing w rflx)     Status: None   Collection Time: 02/04/22  7:32 AM  Result Value Ref Range   HIV Screen 4th Generation wRfx Non Reactive Non Reactive    Comment: Performed at [facility], [place] [address], [place], [place] [address]  Basic metabolic panel     Status: Abnormal   Collection Time: 02/04/22  7:32 AM  Result Value Ref Range   Sodium 137 135 - 145 mmol/L   Potassium 3.5 3.5 - 5.1 mmol/L   Chloride 104 98 - 111 mmol/L   CO2 21 (L) 22 - 32 mmol/L   Glucose, Bld 189 (H) 70 - 99 mg/dL    Comment: Glucose reference range applies only to samples taken after fasting for at least 8 hours.   BUN 22 8 - 23 mg/dL   Creatinine, Ser 1.04 (H) 0.44 - 1.00 mg/dL   Calcium 8.7 (L) 8.9 - 10.3 mg/dL   GFR, Estimated >60 >60 mL/min    Comment: (NOTE) Calculated using the CKD-EPI Creatinine Equation (2021)    Anion gap 12 5 - 15    Comment: Performed at [facility], [address], [place], [place] [address]  Magnesium     Status: None   Collection Time: 02/04/22  7:32 AM  Result Value Ref Range   Magnesium 2.0 1.7 - 2.4 mg/dL    Comment: Performed at [facility], [address] [place], [place] [address]  BUN     Status: None   Collection Time: 02/22/22 10:18 AM  Result Value Ref Range   BUN 17 8 - 23 mg/dL    Comment: Performed at [facility], [address] [place], [place] [address]  Creatinine, serum     Status: Abnormal   Collection Time: 02/22/22 10:18 AM  Result Value Ref Range   Creatinine, Ser 1.10 (H) 0.44 - 1.00 mg/dL   GFR, Estimated 57 (L) >60 mL/min    Comment: (NOTE) Calculated using the CKD-EPI Creatinine Equation (2021) Performed at [facility], [address] [place], [place] [address]   BUN     Status: None   Collection Time: 03/13/22  9:58 AM  Result Value Ref Range   BUN 12 8 - 23 mg/dL    Comment: Performed at [facility], [address] [place], [place] [address]  Creatinine, serum     Status: Abnormal   Collection Time: 03/13/22  9:58 AM  Result Value Ref Range   Creatinine, Ser 1.16 (H) 0.44 - 1.00 mg/dL   GFR, Estimated 53 (L) >60 mL/min    Comment: (NOTE) Calculated using the CKD-EPI Creatinine Equation (2021) Performed at [facility], [address] [place], [place] [address]     Radiology PERIPHERAL VASCULAR CATHETERIZATION  Result Date: 03/13/2022 See surgical note for result.  PERIPHERAL VASCULAR CATHETERIZATION  Result Date: 02/22/2022 See surgical note for result.  VAS [place] ABI WITH/WO TBI  Result Date: 03/09/2022  LOWER EXTREMITY DOPPLER STUDY Patient Name:  SALOMA CADENA  Date of Exam:   03/07/2022 Medical Rec #: [id number]      Accession #:    [id number] Date of Birth: Sep 13, 1961       Patient Gender: F Patient  Age:   2 years Exam Location:  [facility] Procedure:      VAS [place] ABI WITH/WO TBI Referring Phys: Arna Medici BROWN --------------------------------------------------------------------------------  Indications: LLE pain.  Vascular Interventions: 12/13/20: right cia,eia thrombectomy                         01/10/21: rt thrombectomy new bilat kissing stents in                         cia's, addtl rt eia stent;                         01/24/21: repeat thrombectomy right;                         06/29/21: Right CIA/EIA thrombectomy with bilateral CIA                         PTAs;                         11/28/21: Left CIA/EIA thrombectomy/PTA;                         12/01/2021: Left CFA,PFA and SFA Endartectomies and                         patch. Performing Technologist: Blondell Reveal RT, RDMS, RVT  Examination Guidelines: A complete evaluation includes at minimum, Doppler waveform signals and  systolic blood pressure reading at the level of bilateral brachial, anterior tibial, and posterior tibial arteries, when vessel segments are accessible. Bilateral testing is considered an integral part of a complete examination. Photoelectric Plethysmograph (PPG) waveforms and toe systolic pressure readings are included as required and additional duplex testing as needed. Limited examinations for reoccurring indications may be performed as noted.  ABI Findings: +---------+------------------+-----+------------+--------+ Right    Rt Pressure (mmHg)IndexWaveform    Comment  +---------+------------------+-----+------------+--------+ Brachial 136                                         +---------+------------------+-----+------------+--------+ ATA      64                0.46 monophasic           +---------+------------------+-----+------------+--------+ PTA                             not detected         +---------+------------------+-----+------------+--------+ PERO     62                0.45 monophasic           +---------+------------------+-----+------------+--------+ Great Toe40                0.29 Abnormal             +---------+------------------+-----+------------+--------+ +---------+------------------+-----+----------+-------+ Left     Lt Pressure (mmHg)IndexWaveform  Comment +---------+------------------+-----+----------+-------+ Brachial 139                                      +---------+------------------+-----+----------+-------+  ATA      103               0.74 monophasic        +---------+------------------+-----+----------+-------+ PTA      67                0.48 monophasic        +---------+------------------+-----+----------+-------+ Great Toe58                0.42 Normal            +---------+------------------+-----+----------+-------+ +-------+-----------+-----------+------------+------------+ ABI/TBIToday's ABIToday's  TBIPrevious ABIPrevious TBI +-------+-----------+-----------+------------+------------+ Right  0.46       0.29       0.63        0.61         +-------+-----------+-----------+------------+------------+ Left   0.74       0.42       0.75        0.75         +-------+-----------+-----------+------------+------------+ Right ABIs appear decreased compared to prior study on 02/15/22. Left ABIs appear essentially unchanged.  Summary: Right: Resting right ankle-brachial index indicates severe right lower extremity arterial disease. The right toe-brachial index is abnormal. Left: Resting left ankle-brachial index indicates moderate left lower extremity arterial disease. The left toe-brachial index is abnormal. *See table(s) above for measurements and observations.  Electronically signed by [name] MD on 03/09/2022 at 12:24:25 PM.    Final     Assessment/Plan Essential hypertension with goal blood pressure less than 140/90 blood pressure control important in reducing the progression of atherosclerotic disease. On appropriate oral medications.     Prediabetes blood glucose control important in reducing the progression of atherosclerotic disease. Also, involved in wound healing. On appropriate medications.     Hyperlipidemia lipid control important in reducing the progression of atherosclerotic disease. 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  This could be contributing to her rectal bleeding although I am not sure.  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 This is an incredibly challenging and difficult situation.  She has an ischemic leg with what sounds like a relatively  active GI bleed.  We have arranged for her to be admitted to the hospitalist service for GI work-up.  She will likely need another right lower extremity angiogram although I am very concerned about the durability of any intervention as it has failed even on anticoagulation recently.  I have discussed with her that we need to strongly consider proceeding with amputation if we have another failure.  If we continue to perform interventions and anticoagulate her if she has bleeding issues, that may be a life-threatening situation and not just a limb threatening situation.  The patient is tearful but understands the gravity of the situation.  We will be following her in the hospital and at some point we will plan a angiogram with possible revascularization after her GI work-up.    [name], MD  03/21/2022 5:30 PM    This note was created with Dragon medical transcription system.  Any errors from dictation are purely unintentional

## 2022-03-22 DIAGNOSIS — K625 Hemorrhage of anus and rectum: Secondary | ICD-10-CM | POA: Diagnosis not present

## 2022-03-22 LAB — URINALYSIS, COMPLETE (UACMP) WITH MICROSCOPIC
Bilirubin Urine: NEGATIVE
Glucose, UA: NEGATIVE mg/dL
Ketones, ur: NEGATIVE mg/dL
Nitrite: POSITIVE — AB
Protein, ur: 30 mg/dL — AB
RBC / HPF: >50 RBC/hpf — ABNORMAL HIGH (ref 0–5)
Specific Gravity, Urine: 1.016 (ref 1.005–1.030)
WBC, UA: >50 WBC/hpf — ABNORMAL HIGH (ref 0–5)
pH: 6 (ref 5.0–8.0)

## 2022-03-22 LAB — CBC
HCT: 34.5 % — ABNORMAL LOW (ref 36.0–46.0)
Hemoglobin: 10.6 g/dL — ABNORMAL LOW (ref 12.0–15.0)
MCH: 25.5 pg — ABNORMAL LOW (ref 26.0–34.0)
MCHC: 30.7 g/dL (ref 30.0–36.0)
MCV: 83.1 fL (ref 80.0–100.0)
Platelets: 274 10*3/uL (ref 150–400)
RBC: 4.15 MIL/uL (ref 3.87–5.11)
RDW: 17.2 % — ABNORMAL HIGH (ref 11.5–15.5)
WBC: 5.8 10*3/uL (ref 4.0–10.5)
nRBC: 0 % (ref 0.0–0.2)

## 2022-03-22 MED ORDER — BOOST / RESOURCE BREEZE PO LIQD CUSTOM
1.0000 | Freq: Three times a day (TID) | ORAL | Status: DC
  Administered 2022-03-22: 1 via ORAL

## 2022-03-22 MED ORDER — CEPHALEXIN 500 MG PO CAPS
500.0000 mg | ORAL_CAPSULE | Freq: Two times a day (BID) | ORAL | Status: DC
  Administered 2022-03-22 – 2022-03-24 (×4): 500 mg via ORAL
  Filled 2022-03-22 (×4): qty 1

## 2022-03-22 MED ORDER — SODIUM CHLORIDE 0.9 % IV SOLN: INTRAVENOUS | Status: DC

## 2022-03-22 MED ORDER — PEG 3350-KCL-NA BICARB-NACL 420 G PO SOLR
4000.0000 mL | Freq: Once | ORAL | Status: AC
  Administered 2022-03-22: 4000 mL via ORAL
  Filled 2022-03-22: qty 4000

## 2022-03-22 NOTE — Progress Notes (Signed)
Initial Nutrition Assessment  DOCUMENTATION CODES:   Morbid obesity  INTERVENTION:   Boost Breeze po TID, each supplement provides 250 kcal and 9 grams of protein  Ensure Enlive po TID with diet advancement, each supplement provides 350 kcal and 20 grams of protein.  MVI po daily with diet advancement   NUTRITION DIAGNOSIS:   Inadequate oral intake related to acute illness as evidenced by other (comment) (pt on clear liquid diet).  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  Met with pt in room today. Pt is well known to this RD from previous admissions. Pt reports fair appetite and oral intake at baseline. Pt reports that she is hungry today. Pt currently on clear liquid diet and reports eating some broth and jello for lunch. Pt previously reported drinking chocolate Ensure at home but reports today that she has gotten away from drinking this. Pt is willing to drink supplements in hospital. Per chart, pt is down 9lbs(4%) over the past month; this is not significant. RD will add supplements and MVI to help pt meet her estimated needs. Pt prefers peach Boost Breeze. Plan is for colonoscopy tomorrow.   Medications reviewed and include: celexa, protonix  Labs reviewed: K 3.2(L), Mg 1.8 wnl Hgb 10.6(L), Hct 34.5(L)  NUTRITION - FOCUSED PHYSICAL EXAM:  Flowsheet Row Most Recent Value  Orbital Region No depletion  Upper Arm Region No depletion  Thoracic and Lumbar Region No depletion  Buccal  Region No depletion  Temple Region No depletion  Clavicle Bone Region No depletion  Clavicle and Acromion Bone Region No depletion  Scapular Bone Region No depletion  Dorsal Hand No depletion  Patellar Region No depletion  Anterior Thigh Region No depletion  Posterior Calf Region No depletion  Edema (RD Assessment) None  Hair Reviewed  Eyes Reviewed  Mouth Reviewed  Skin Reviewed  [ecchymosis]  Nails Reviewed   Diet Order:   Diet Order             Diet NPO time specified  Diet effective midnight           Diet clear liquid Room service appropriate? Yes; Fluid consistency: Thin  Diet effective now                  EDUCATION NEEDS:   Education needs have been addressed  Skin:  Skin Assessment: Reviewed RN Assessment (ecchymosis)  Last BM:  5/30  Height:   Ht Readings from Last 1 Encounters:  03/21/22 $RemoveB'5\' 2"'TejnRjiS$  (1.575 m)    Weight:   Wt Readings from Last 1 Encounters:  03/22/22 101 kg    Ideal Body Weight:  50 kg  BMI:  Body mass index is 40.73 kg/m.  Estimated Nutritional Needs:   Kcal:  1900-2200kcal/day  Protein:  95-110g/day  Fluid:  1.5-1.8L/day  Koleen Distance MS, RD, LDN Please refer to [facility] for RD and/or RD on-call/weekend/after hours pager

## 2022-03-22 NOTE — Progress Notes (Signed)
  Progress Note   Patient: Carla [name] UXL:[id number] DOB: 07-14-1961 DOA: 03/21/2022     1 DOS: the patient was seen and examined on 03/22/2022   Brief hospital course:      Carla [name] is a 61 y.o. female with medical history significant for peripheral arterial disease with right lower extremity pain at rest status post recent peripheral angiogram for reduced right ABI which showed occlusion of the posterior tibial artery in the mid to distal segment and the anterior tibial artery in the midsegment without distal reconstitution of these vessels, history of chronic diastolic dysfunction CHF, anxiety and depression, COPD, paroxysmal A-fib on chronic anticoagulation therapy, morbid obesity who presents to the ER from the vascular clinic for evaluation of rectal bleeding. Assessment and Plan: * Rectal bleeding --Patient admitted to the hospital for evaluation of rectal bleeding --Patient is on apixaban and aspirin--on hold --on IV Protonix --Gastroenterology consult with Dr Kendrick Fries for flex sigmoidoscopy tomorrow   PAD (peripheral artery disease) ([place]) --Patient has severe peripheral arterial disease and presents for evaluation of rest pain in her right lower extremity. ---She is status post peripheral angiogram which shows occlusion of the posterior tibial artery in the mid to distal segment and anterior tibial artery in the midsegment without distal reconstitution of these vessels. --Patient has been seen by vascular surgery Dr Lucky Cowboy but awaiting GI evaluation prior to initiation of IV heparin for definitive management --prn pain control --Continue atorvastatin and metoprolol --Hold aspirin  PAF (paroxysmal atrial fibrillation) ([place]) --Patient has a history of paroxysmal A-fib --Continue metoprolol for rate control --Hold apixaban  Morbid obesity with BMI of 40.0-44.9, adult ([place]) Patient has a BMI of 53.66 kg/m2 Complicates overall prognosis and care  Chronic diastolic CHF  (congestive heart failure) (HCC) Stable and not acutely exacerbated Continue metoprolol and nitrates  Recurrent major depressive disorder (H[facility]) Continue Celexa        Subjective: no rectal bleed today. C/o chronic leg pain  Physical Exam: Vitals:   03/22/22 0450 03/22/22 0600 03/22/22 0725 03/22/22 1520  BP: (!) 115/54  (!) 145/81 99/70  Pulse: (!) 54  64 (!) 53  Resp: '16  16 18  '$ Temp: 97.9 F (36.6 C)  97.8 F (36.6 C) 98 F (36.7 C)  TempSrc:   Oral Oral  SpO2: 93%  91% 96%  Weight:  101 kg    Height:      Constitutional:      Appearance: She is obese.     Comments: Chronically ill-appearing.  Cardiovascular:     Rate and Rhythm: Normal rate and regular rhythm.  Pulmonary:     Effort: Pulmonary effort is normal.     Breath sounds: Normal breath sounds.  Abdominal:     General: Bowel sounds are normal.     Palpations: Abdomen is soft.     Tenderness: There is abdominal tenderness.     Comments: Ventral wall hernia in the right lower quadrant.  Right lower quadrant tenderness  Neurological:   Mental Status: She is alert.    Family Communication: family at bedside  Disposition: Status is: Inpatient Remains inpatient appropriate because: GI w/u  Planned Discharge Destination: Home    Time spent: 30 minutes  Author: Fritzi Mandes, MD 03/22/2022 3:45 PM  For on call review [email or web address].

## 2022-03-22 NOTE — Consult Note (Signed)
Carla [name] , MD [address] [place], [place], [place], [address] [address], [place], [place], [place], [address] Phone: [phone]  Fax: [phone]  Consultation  Referring Provider:     Dr Francine Graven Primary Care Physician:  Pcp, No Primary Gastroenterologist:  [facility]          Reason for Consultation:     Rectal bleeding   Date of Admission:  03/21/2022 Date of Consultation:  03/22/2022         HPI:   Carla [name] is a 61 y.o. female.  COPD, hypertension presents to the ER for 4 days history of rectal bleeding.     Presented to the ER with severe leg pain. CT scan of the abdomen in the ER showed large right lower quadrant ventral hernia otherwise normal. Hemoglobin in the ER was 7.4 g and MCV of 82 at baseline. Apixaban had been held since yesterday.  Seen by vascular surgery and diagnosed with right foot, GI work-up planned to evaluate further after GI work-up.  Being seen at [facility] in 06/11/2020 for weight loss surgery.  Seen in January 2021 at [facility] clinic gastroenterology ventral hernia.  Previously consulted Dr. Lysle Pearl for evaluation of ventral hernia but was referred to tertiary center bariatric surgery has been discussed in the past.  Unit that point there was some intermittent nausea no rectal bleeding no prior colonoscopy.  She states that she has had 4 days history of blood on the tissue paper on the toilet bowl bright red in color.  Denies any constipation, denies any pain.  No unintentional weight loss. Past Medical History:  Diagnosis Date   Abdominal aortic atherosclerosis ([place])    a. 05/2017 CTA abd/pelvis: significant atherosclerotic dzs of the infrarenal abd Ao w/ some mural thrombus. No aneurysm or dissection.   Acute focal ischemia of small intestine (HCC)    Acute right-sided low back pain with right-sided sciatica 06/13/2017   AKI (acute kidney injury) ([place]) 07/29/2017   Anginal pain ([place])    a. 08/2012 Lexiscan MV: EF 54%, non  ischemia/infarct.   Anxiety and depression    [place]'s cyst of knee, right    a. 07/2016 U/S: 4.1 x 1.4 x 2.9 cystic structure in R poplitetal fossa.   Bell's palsy    Borderline diabetes    CHF (congestive heart failure) (HCC)    Chronic anticoagulation    apixaban   Chronic idiopathic constipation    Chronic mesenteric ischemia (HCC)    Chronic pain    COPD (chronic obstructive pulmonary disease) (HCC)    Diastolic dysfunction    a. 05/2017 Echo: Ef 60-65%, no rwma, Gr1 DD, no source of cardiac emboli.   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 05/2017 CTA Abd/pelvis: apparent thrombus or embolus in prox SMA (70-90%); b. 05/2017 catheter directed tPA, mechanical thrombectomy, and stenting of the SMA.   Essential hypertension with goal blood pressure less than 140/90 01/19/2016   Gastroesophageal reflux disease 01/19/2016   GERD (gastroesophageal reflux disease)    H/O colectomy    History of 2019 novel coronavirus disease (COVID-19) 11/14/2019   History of kidney stones    Hyperlipidemia    Hypertension    Morbid obesity with BMI of 40.0-44.9, adult ([place])    Occlusive mesenteric ischemia ([place]) 06/19/2017   Osteoarthritis    PAD (peripheral artery disease) (HCC)    PAF (paroxysmal atrial fibrillation) (HCC)    Palpitations  Peripheral edema    Personal history of other malignant neoplasm of skin 06/01/2016   Pneumonia    Pre-diabetes    Primary osteoarthritis of both knees 06/13/2017   SBO (small bowel obstruction) ([place]) 08/06/2017   Skin cancer of nose    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HCC)    Vitamin D deficiency     Past Surgical History:  Procedure Laterality Date   AMPUTATION TOE Left 12/09/2019   Procedure: AMPUTATION TOE MPJ left;  Surgeon: Caroline More, DPM;  Location: [facility];  Service: Podiatry;  Laterality: Left;   APPENDECTOMY     APPLICATION OF WOUND VAC  12/01/2021   Procedure: APPLICATION OF WOUND VAC;  Surgeon: Algernon Huxley,  MD;  Location: [facility];  Service: Vascular;;   CHOLECYSTECTOMY     COLON SURGERY     EMBOLECTOMY OR THROMBECTOMY, WITH OR WITHOUT CATHETER; FEMOROPOPLITEAL, AORTOILIAC ARTERY, BY LEG INCISION N/A 06/21/2020   Left - Decomp Fasciotomy Leg; Ant &/Or Lat Compart Only; Midline - Angiography, Visceral, Selective Or Supraselective (With Or Without Flush Aortogram); Left - Revascularize, Endovasc, Open/Percut, Iliac Artery, Unilat, Initial Vessel; W/Translum Stent, W/Angioplasty; Right - Revascularize, Endovasc, Open/Percut, Iliac Artery, Ea Add`L Ipsilateral; W/Translumin Stent, W/Angioplasty; Location [facility];   ENDARTERECTOMY FEMORAL Left 12/01/2021   Procedure: ENDARTERECTOMY FEMORAL;  Surgeon: Algernon Huxley, MD;  Location: [facility];  Service: Vascular;  Laterality: Left;   ENDOVASCULAR REPAIR/STENT GRAFT Left 12/01/2021   Procedure: ENDOVASCULAR REPAIR/STENT GRAFT;  Surgeon: Algernon Huxley, MD;  Location: [place] [facility];  Service: Cardiovascular;  Laterality: Left;   LAPAROTOMY N/A 08/08/2017   Procedure: EXPLORATORY LAPAROTOMY POSSIBLE BOWEL RESECTION;  Surgeon: Jules Husbands, MD;  Location: [facility];  Service: General;  Laterality: N/A;   LEFT HEART CATH AND CORONARY ANGIOGRAPHY N/A 05/17/2021   Procedure: LEFT HEART CATH AND CORONARY ANGIOGRAPHY;  Surgeon: Yolonda Kida, MD;  Location: [place] [facility];  Service: Cardiovascular;  Laterality: N/A;   LOWER EXTREMITY ANGIOGRAPHY Left 11/17/2019   Procedure: LOWER EXTREMITY ANGIOGRAPHY;  Surgeon: Algernon Huxley, MD;  Location: [place] [facility];  Service: Cardiovascular;  Laterality: Left;   LOWER EXTREMITY ANGIOGRAPHY Right 12/13/2020   Procedure: LOWER EXTREMITY ANGIOGRAPHY;  Surgeon: Algernon Huxley, MD;  Location: [place] [facility];  Service: Cardiovascular;  Laterality: Right;   LOWER EXTREMITY ANGIOGRAPHY Right 01/10/2021   Procedure: LOWER EXTREMITY ANGIOGRAPHY;  Surgeon: Algernon Huxley, MD;  Location: [place] [facility];  Service:  Cardiovascular;  Laterality: Right;   LOWER EXTREMITY ANGIOGRAPHY Right 01/24/2021   Procedure: LOWER EXTREMITY ANGIOGRAPHY;  Surgeon: Algernon Huxley, MD;  Location: [place] [facility];  Service: Cardiovascular;  Laterality: Right;   LOWER EXTREMITY ANGIOGRAPHY Bilateral 06/28/2021   Procedure: Lower Extremity Angiography;  Surgeon: Algernon Huxley, MD;  Location: [place] [facility];  Service: Cardiovascular;  Laterality: Bilateral;   LOWER EXTREMITY ANGIOGRAPHY Right 11/28/2021   Procedure: LOWER EXTREMITY ANGIOGRAPHY;  Surgeon: Algernon Huxley, MD;  Location: [place] [facility];  Service: Cardiovascular;  Laterality: Right;   LOWER EXTREMITY ANGIOGRAPHY Left 11/30/2021   Procedure: Lower Extremity Angiography;  Surgeon: Algernon Huxley, MD;  Location: [place] [facility];  Service: Cardiovascular;  Laterality: Left;   LOWER EXTREMITY ANGIOGRAPHY Right 02/22/2022   Procedure: Lower Extremity Angiography;  Surgeon: Algernon Huxley, MD;  Location: [place] [facility];  Service: Cardiovascular;  Laterality: Right;   LOWER EXTREMITY ANGIOGRAPHY Right 03/13/2022   Procedure: Lower Extremity Angiography;  Surgeon: Lucky Cowboy,  Erskine Squibb, MD;  Location: [place] [facility];  Service: Cardiovascular;  Laterality: Right;   LOWER EXTREMITY INTERVENTION N/A 11/19/2019   Procedure: LOWER EXTREMITY INTERVENTION;  Surgeon: Algernon Huxley, MD;  Location: [place] [facility];  Service: Cardiovascular;  Laterality: N/A;   LOWER EXTREMITY INTERVENTION Bilateral 06/29/2021   Procedure: LOWER EXTREMITY INTERVENTION;  Surgeon: Algernon Huxley, MD;  Location: [place] [facility];  Service: Cardiovascular;  Laterality: Bilateral;   TEE WITHOUT CARDIOVERSION N/A 06/22/2017   Procedure: TRANSESOPHAGEAL ECHOCARDIOGRAM (TEE);  Surgeon: Wellington Hampshire, MD;  Location: [facility];  Service: Cardiovascular;  Laterality: N/A;   TOTAL KNEE ARTHROPLASTY Right 07/11/2018   Procedure: TOTAL KNEE ARTHROPLASTY;  Surgeon: Corky Mull, MD;  Location: [facility];   Service: Orthopedics;  Laterality: Right;   VAGINAL HYSTERECTOMY     VISCERAL ARTERY INTERVENTION N/A 06/20/2017   Procedure: Visceral Artery Intervention, possible aortic thrombectomy;  Surgeon: Algernon Huxley, MD;  Location: [place] [facility];  Service: Cardiovascular;  Laterality: N/A;   VISCERAL ARTERY INTERVENTION N/A 01/28/2018   Procedure: VISCERAL ARTERY INTERVENTION;  Surgeon: Algernon Huxley, MD;  Location: [place] [facility];  Service: Cardiovascular;  Laterality: N/A;    Prior to Admission medications   Medication Sig Start Date End Date Taking? Authorizing Provider  apixaban (ELIQUIS) 5 MG TABS tablet Take 1 tablet (5 mg total) by mouth 2 (two) times daily. 01/25/21  Yes Stegmayer, Janalyn Harder, PA-C  aspirin EC 81 MG tablet Take 81 mg by mouth daily. Swallow whole.   Yes [provider]  atorvastatin (LIPITOR) 80 MG tablet Take 80 mg by mouth at bedtime.   Yes [provider]  citalopram (CELEXA) 40 MG tablet Take 1 tablet (40 mg total) by mouth daily. 02/04/22 05/05/22 Yes Enzo Bi, MD  HYDROcodone-acetaminophen (NORCO/VICODIN) 5-325 MG tablet Take 1 tablet by mouth every 6 (six) hours as needed for moderate pain. 03/07/22 03/07/23 Yes Kris Hartmann, NP  isosorbide mononitrate (IMDUR) 60 MG 24 hr tablet Take 60 mg by mouth daily. 01/23/22  Yes [provider]  metoprolol tartrate (LOPRESSOR) 50 MG tablet Take 1 tablet (50 mg total) by mouth 2 (two) times daily. 06/15/18  Yes Mody, Ulice Bold, MD  omeprazole (PRILOSEC) 20 MG capsule Take 20 mg by mouth 2 (two) times daily before a meal.   Yes [provider]  oxyCODONE-acetaminophen (PERCOCET) 5-325 MG tablet Take 1 tablet by mouth every 4 (four) hours as needed for severe pain. 03/16/22 03/16/23 Yes Blake Divine, MD  pregabalin (LYRICA) 100 MG capsule Take 1 capsule (100 mg total) by mouth 3 (three) times daily. 02/04/22 05/05/22 Yes Enzo Bi, MD  COMBIVENT RESPIMAT 20-100 MCG/ACT AERS respimat Inhale 1 puff  into the lungs daily as needed for wheezing or shortness of breath. Patient not taking: Reported on 03/21/2022 04/06/21   [provider]  ferrous sulfate 325 (65 FE) MG tablet Take 325 mg by mouth daily.    [provider]  nitroGLYCERIN (NITROSTAT) 0.4 MG SL tablet Place 0.4 mg under the tongue as directed. 06/02/21 06/02/22  [provider]  traMADol (ULTRAM) 50 MG tablet Take 50 mg by mouth every 6 (six) hours as needed. 03/15/22   [provider]    Family History  Problem Relation Age of Onset   Hypertension Mother    Heart disease Mother    Heart attack Mother    Breast cancer Mother    Hypertension Father    Breast cancer  Paternal Aunt      Social History   Tobacco Use   Smoking status: Former    Packs/day: 1.00    Types: Cigarettes   Smokeless tobacco: Never  Vaping Use   Vaping Use: Never used  Substance Use Topics   Alcohol use: No   Drug use: No    Allergies as of 03/21/2022 - Review Complete 03/21/2022  Allergen Reaction Noted   Gabapentin  12/04/2019   Bactrim [sulfamethoxazole-trimethoprim] Itching 02/03/2019    Review of Systems:    All systems reviewed and negative except where noted in HPI.   Physical Exam:  Vital signs in last 24 hours: Temp:  [97.4 F (36.3 C)-97.9 F (36.6 C)] 97.8 F (36.6 C) (05/31 0725) Pulse Rate:  [54-74] 64 (05/31 0725) Resp:  [16] 16 (05/31 0725) BP: (115-145)/(54-98) 145/81 (05/31 0725) SpO2:  [91 %-96 %] 91 % (05/31 0725) Weight:  [99.5 kg-101 kg] 101 kg (05/31 0600) Last BM Date : 03/21/22 General:   Pleasant, cooperative in NAD Head:  Normocephalic and atraumatic. Eyes:   No icterus.   Conjunctiva pink. PERRLA. Ears:  Normal auditory acuity. Neck:  Supple; no masses or thyroidomegaly Lungs: Respirations even and unlabored. Lungs clear to auscultation bilaterally.   No wheezes, crackles, or rhonchi.  Heart:  Regular rate and rhythm;  Without murmur, clicks, rubs or  gallops Abdomen:  Soft, nondistended, nontender. Normal bowel sounds. No appreciable masses or hepatomegaly.  No rebound or guarding.  Neurologic:  Alert and oriented x3;  grossly normal neurologically. Psych:  Alert and cooperative. Normal affect.  LAB RESULTS: Recent Labs    03/21/22 1714 03/22/22 0525  WBC 6.5 5.8  HGB 11.4* 10.6*  HCT 36.5 34.5*  PLT 315 274   BMET Recent Labs    03/21/22 1714  NA 138  K 3.2*  CL 101  CO2 23  GLUCOSE 140*  BUN 11  CREATININE 0.95  CALCIUM 9.0   LFT No results for input(s): PROT, ALBUMIN, AST, ALT, ALKPHOS, BILITOT, BILIDIR, IBILI in the last 72 hours. PT/INR No results for input(s): LABPROT, INR in the last 72 hours.  STUDIES: CT ABDOMEN PELVIS WO CONTRAST  Result Date: 03/21/2022 CLINICAL DATA:  Rectal bleeding for 4 days, loose stools, right lower quadrant pain EXAM: CT ABDOMEN AND PELVIS WITHOUT CONTRAST TECHNIQUE: Multidetector CT imaging of the abdomen and pelvis was performed following the standard protocol without IV contrast. Unenhanced CT was performed per clinician order. Lack of IV contrast limits sensitivity and specificity, especially for evaluation of abdominal/pelvic solid viscera and vascular structures. RADIATION DOSE REDUCTION: This exam was performed according to the departmental dose-optimization program which includes automated exposure control, adjustment of the mA and/or kV according to patient size and/or use of iterative reconstruction technique. COMPARISON:  10/01/2020 FINDINGS: Lower chest: No acute pleural or parenchymal lung disease. Hepatobiliary: Cholecystectomy. Unremarkable unenhanced appearance of the liver. Pancreas: Unremarkable unenhanced appearance. Spleen: Unremarkable unenhanced appearance. Adrenals/Urinary Tract: Chronic left adrenal thickening measuring up to 15 mm, with attenuation of -4 HU, consistent with adenoma or hyperplasia. No specific follow-up is required given stability and imaging features.  Right adrenal is unremarkable. No urinary tract calculi or obstructive uropathy within either kidney. Bladder is grossly normal. Stomach/Bowel: No bowel obstruction or ileus. Large right lower quadrant ventral hernia is again identified, containing multiple loops of large and small bowel. No evidence of incarceration or bowel obstruction. No bowel wall thickening or inflammatory change. Vascular/Lymphatic: Stable aortic atherosclerosis. Stable SMA stent and distal aortic endoluminal  stent graft. Evaluation of the vascular lumen is limited without IV contrast. No pathologic adenopathy. Reproductive: Status post hysterectomy. No adnexal masses. Other: No free fluid or free intraperitoneal gas. Large right lower quadrant ventral hernia as described above. Musculoskeletal: No acute or destructive bony lesions. Reconstructed images demonstrate no additional findings. IMPRESSION: 1. Large right lower quadrant ventral hernia unchanged. No evidence of incarceration or bowel obstruction. 2. No acute intra-abdominal or intrapelvic process. 3.  Aortic Atherosclerosis (ICD10-I70.0). Electronically Signed   By: Randa Ngo M.D.   On: 03/21/2022 21:37   VAS [place] LOWER EXTREMITY ARTERIAL DUPLEX  Result Date: 03/22/2022 LOWER EXTREMITY ARTERIAL DUPLEX STUDY Patient Name:  Carla [name]  Date of Exam:   03/21/2022 Medical Rec #: [id number]      Accession #:    [id number] Date of Birth: 02/13/1961       Patient Gender: F Patient Age:   13 years Exam Location:  [facility] Procedure:      VAS [place] LOWER EXTREMITY ARTERIAL DUPLEX Referring Phys: [name] DEW --------------------------------------------------------------------------------  Indications: LLE pain.  Vascular Interventions: 12/13/20: right cia,eia thrombectomy                         01/10/21: rt thrombectomy new bilat kissing stents in                         cia's, addtl rt eia stent;                         01/24/21: repeat thrombectomy right;                          06/29/21: Right CIA/EIA thrombectomy with bilateral CIA                         PTAs;                         11/28/21: Left CIA/EIA thrombectomy/PTA;                         12/01/2021: Left CFA,PFA and SFA Endartectomies and                         patch.                          03/13/2022: Aortogram and Selective Bilateral                         Iliofemoral Arteriogram. PTA of the Left CIA with 7 mm                         diameter angioplasty balloon. Mechanical Thrombectomy                         using the penumbra CAT 7D catheter to the Right CIA and                         EIA. PTA of the Right CIA and EIA with 8 mm diameter  angioplasty balloon. Current ABI:            Not obtained Performing Technologist: Almira Coaster RVS  Examination Guidelines: A complete evaluation includes B-mode imaging, spectral Doppler, color Doppler, and power Doppler as needed of all accessible portions of each vessel. Bilateral testing is considered an integral part of a complete examination. Limited examinations for reoccurring indications may be performed as noted.  +-----------+--------+-----+--------+----------+--------+ RIGHT      PSV cm/sRatioStenosisWaveform  Comments +-----------+--------+-----+--------+----------+--------+ EIA Distal 54                   monophasic         +-----------+--------+-----+--------+----------+--------+ CFA Distal 40                   monophasic         +-----------+--------+-----+--------+----------+--------+ DFA        41                   monophasic         +-----------+--------+-----+--------+----------+--------+ SFA Prox   51                   monophasic         +-----------+--------+-----+--------+----------+--------+ SFA Mid    44                   monophasic         +-----------+--------+-----+--------+----------+--------+ SFA Distal 34                   monophasic          +-----------+--------+-----+--------+----------+--------+ POP Distal 25                   monophasic         +-----------+--------+-----+--------+----------+--------+ ATA Distal 9                    monophasic         +-----------+--------+-----+--------+----------+--------+ PTA Distal 13                   monophasic         +-----------+--------+-----+--------+----------+--------+ PERO Distal25                   monophasic         +-----------+--------+-----+--------+----------+--------+  Summary: Right: Imaging and Waveforms obtained throughout in the Right Lower Extremity. Monophasic flow seen throughout in the Right Lower Extremity.  See table(s) above for measurements and observations. Electronically signed by [name] MD on 03/22/2022 at 9:20:14 AM.    Final       Impression / Plan:   Carla [name] is a 61 y.o. y/o female presents to the emergency room with a 4-day history of rectal bleeding bright red in color and painless.  She is on apixaban.  She does have a ventral hernia.  No recent colonoscopy.  Vascular surgery would like to perform further evaluation once GI work-up has been completed.Apixaban has been held since yesterday.  Explained that we can perform colonoscopy tomorrow   I have discussed alternative options, risks & benefits,  which include, but are not limited to, bleeding, infection, perforation,respiratory complication & drug reaction.  The patient agrees with this plan & written consent will be obtained.     Thank you for involving me in the care of this patient.      LOS: 1 day   Carla Bellows, MD  03/22/2022, 11:31 AM

## 2022-03-22 NOTE — Consult Note (Signed)
[facility] Vascular Consult Note  MRN : [id number]  Carla [name] is a 61 y.o. (04-05-1961) female who presents with chief complaint of No chief complaint on file. .   Consulting Physician: Francine Graven, MD Reason for consult: Ischemic right lower extremity History of Present Illness: Carla [name] is a 61 year old female who is well-known to our practice multiple vascular interventions.  The patient has a previous medical history of hypertension, hyperlipidemia, ventral hernia, paroxysmal atrial fibrillation and mesenteric ischemia.  The patient presented to our office yesterday with complaints of severe right lower extremity pain despite revascularization on 03/13/2022.  It was found that the patient had dampened flow throughout the right lower extremity which suggested a reocclusion of her iliac artery.  Complicating the patient's situation she noted that she had been bleeding bright red blood for the last 4 to 5 days per rectum.  This drastically complicates the revascularization options in addition to the possible patency of any further interventions.  Patient has a known ventral hernia and previously when her anticoagulation was stopped for possible surgery, the patient had a heart attack as well as an ischemic limb.  Current Facility-Administered Medications  Medication Dose Route Frequency Provider Last Rate Last Admin   0.9 %  sodium chloride infusion   Intravenous Continuous Jonathon Bellows, MD       acetaminophen (TYLENOL) tablet 650 mg  650 mg Oral Q6H PRN Agbata, Tochukwu, MD       Or   acetaminophen (TYLENOL) suppository 650 mg  650 mg Rectal Q6H PRN Agbata, Tochukwu, MD       atorvastatin (LIPITOR) tablet 80 mg  80 mg Oral QHS Agbata, Tochukwu, MD   80 mg at 03/22/22 2145   cephALEXin (KEFLEX) capsule 500 mg  500 mg Oral Q12H Fritzi Mandes, MD   500 mg at 03/22/22 1803   citalopram (CELEXA) tablet 40 mg  40 mg Oral Daily Agbata, Tochukwu, MD   40 mg at 03/22/22 7591   feeding  supplement (BOOST / RESOURCE BREEZE) liquid 1 Container  1 Container Oral TID BM Fritzi Mandes, MD   1 Container at 03/22/22 2144   iohexol (OMNIPAQUE) 9 MG/ML oral solution 500 mL  500 mL Oral Once PRN Agbata, Tochukwu, MD   500 mL at 03/21/22 1740   ipratropium-albuterol (DUONEB) 0.5-2.5 (3) MG/3ML nebulizer solution 3 mL  3 mL Nebulization Daily PRN Agbata, Tochukwu, MD       isosorbide mononitrate (IMDUR) 24 hr tablet 60 mg  60 mg Oral Daily Agbata, Tochukwu, MD   60 mg at 03/22/22 0907   metoprolol tartrate (LOPRESSOR) tablet 50 mg  50 mg Oral BID Agbata, Tochukwu, MD   50 mg at 03/22/22 2145   morphine (PF) 2 MG/ML injection 1 mg  1 mg Intravenous Q4H PRN Agbata, Tochukwu, MD   1 mg at 03/22/22 2155   nitroGLYCERIN (NITROSTAT) SL tablet 0.4 mg  0.4 mg Sublingual Q5 min PRN Agbata, Tochukwu, MD       ondansetron (ZOFRAN) tablet 4 mg  4 mg Oral Q6H PRN Agbata, Tochukwu, MD       Or   ondansetron (ZOFRAN) injection 4 mg  4 mg Intravenous Q6H PRN Agbata, Tochukwu, MD       pantoprazole (PROTONIX) injection 40 mg  40 mg Intravenous Q24H Agbata, Tochukwu, MD   40 mg at 03/22/22 1757   pregabalin (LYRICA) capsule 100 mg  100 mg Oral TID Collier Bullock, MD   100 mg at 03/22/22 2145  Past Medical History:  Diagnosis Date   Abdominal aortic atherosclerosis ([place])    a. 05/2017 CTA abd/pelvis: significant atherosclerotic dzs of the infrarenal abd Ao w/ some mural thrombus. No aneurysm or dissection.   Acute focal ischemia of small intestine (HCC)    Acute right-sided low back pain with right-sided sciatica 06/13/2017   AKI (acute kidney injury) ([place]) 07/29/2017   Anginal pain ([place])    a. 08/2012 Lexiscan MV: EF 54%, non ischemia/infarct.   Anxiety and depression    Baker's cyst of knee, right    a. 07/2016 U/S: 4.1 x 1.4 x 2.9 cystic structure in R poplitetal fossa.   Bell's palsy    Borderline diabetes    CHF (congestive heart failure) (HCC)    Chronic anticoagulation    apixaban   Chronic  idiopathic constipation    Chronic mesenteric ischemia (HCC)    Chronic pain    COPD (chronic obstructive pulmonary disease) (HCC)    Diastolic dysfunction    a. 05/2017 Echo: Ef 60-65%, no rwma, Gr1 DD, no source of cardiac emboli.   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 05/2017 CTA Abd/pelvis: apparent thrombus or embolus in prox SMA (70-90%); b. 05/2017 catheter directed tPA, mechanical thrombectomy, and stenting of the SMA.   Essential hypertension with goal blood pressure less than 140/90 01/19/2016   Gastroesophageal reflux disease 01/19/2016   GERD (gastroesophageal reflux disease)    H/O colectomy    History of 2019 novel coronavirus disease (COVID-19) 11/14/2019   History of kidney stones    Hyperlipidemia    Hypertension    Morbid obesity with BMI of 40.0-44.9, adult ([place])    Occlusive mesenteric ischemia ([place]) 06/19/2017   Osteoarthritis    PAD (peripheral artery disease) (HCC)    PAF (paroxysmal atrial fibrillation) (HCC)    Palpitations    Peripheral edema    Personal history of other malignant neoplasm of skin 06/01/2016   Pneumonia    Pre-diabetes    Primary osteoarthritis of both knees 06/13/2017   SBO (small bowel obstruction) ([place]) 08/06/2017   Skin cancer of nose    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HCC)    Vitamin D deficiency     Past Surgical History:  Procedure Laterality Date   AMPUTATION TOE Left 12/09/2019   Procedure: AMPUTATION TOE MPJ left;  Surgeon: Caroline More, DPM;  Location: [facility];  Service: Podiatry;  Laterality: Left;   APPENDECTOMY     APPLICATION OF WOUND VAC  12/01/2021   Procedure: APPLICATION OF WOUND VAC;  Surgeon: Algernon Huxley, MD;  Location: [facility];  Service: Vascular;;   CHOLECYSTECTOMY     COLON SURGERY     EMBOLECTOMY OR THROMBECTOMY, WITH OR WITHOUT CATHETER; FEMOROPOPLITEAL, AORTOILIAC ARTERY, BY LEG INCISION N/A 06/21/2020   Left - Decomp Fasciotomy Leg; Ant &/Or Lat Compart Only; Midline -  Angiography, Visceral, Selective Or Supraselective (With Or Without Flush Aortogram); Left - Revascularize, Endovasc, Open/Percut, Iliac Artery, Unilat, Initial Vessel; W/Translum Stent, W/Angioplasty; Right - Revascularize, Endovasc, Open/Percut, Iliac Artery, Ea Add`L Ipsilateral; W/Translumin Stent, W/Angioplasty; Location [facility];   ENDARTERECTOMY FEMORAL Left 12/01/2021   Procedure: ENDARTERECTOMY FEMORAL;  Surgeon: Algernon Huxley, MD;  Location: [facility];  Service: Vascular;  Laterality: Left;   ENDOVASCULAR REPAIR/STENT GRAFT Left 12/01/2021   Procedure: ENDOVASCULAR REPAIR/STENT GRAFT;  Surgeon: Algernon Huxley, MD;  Location: [place] [facility];  Service: Cardiovascular;  Laterality: Left;   LAPAROTOMY N/A 08/08/2017   Procedure:  EXPLORATORY LAPAROTOMY POSSIBLE BOWEL RESECTION;  Surgeon: Jules Husbands, MD;  Location: [facility];  Service: General;  Laterality: N/A;   LEFT HEART CATH AND CORONARY ANGIOGRAPHY N/A 05/17/2021   Procedure: LEFT HEART CATH AND CORONARY ANGIOGRAPHY;  Surgeon: Yolonda Kida, MD;  Location: [place] [facility];  Service: Cardiovascular;  Laterality: N/A;   LOWER EXTREMITY ANGIOGRAPHY Left 11/17/2019   Procedure: LOWER EXTREMITY ANGIOGRAPHY;  Surgeon: Algernon Huxley, MD;  Location: [place] [facility];  Service: Cardiovascular;  Laterality: Left;   LOWER EXTREMITY ANGIOGRAPHY Right 12/13/2020   Procedure: LOWER EXTREMITY ANGIOGRAPHY;  Surgeon: Algernon Huxley, MD;  Location: [place] [facility];  Service: Cardiovascular;  Laterality: Right;   LOWER EXTREMITY ANGIOGRAPHY Right 01/10/2021   Procedure: LOWER EXTREMITY ANGIOGRAPHY;  Surgeon: Algernon Huxley, MD;  Location: [place] [facility];  Service: Cardiovascular;  Laterality: Right;   LOWER EXTREMITY ANGIOGRAPHY Right 01/24/2021   Procedure: LOWER EXTREMITY ANGIOGRAPHY;  Surgeon: Algernon Huxley, MD;  Location: [place] [facility];  Service: Cardiovascular;  Laterality: Right;   LOWER EXTREMITY ANGIOGRAPHY Bilateral 06/28/2021    Procedure: Lower Extremity Angiography;  Surgeon: Algernon Huxley, MD;  Location: [place] [facility];  Service: Cardiovascular;  Laterality: Bilateral;   LOWER EXTREMITY ANGIOGRAPHY Right 11/28/2021   Procedure: LOWER EXTREMITY ANGIOGRAPHY;  Surgeon: Algernon Huxley, MD;  Location: [place] [facility];  Service: Cardiovascular;  Laterality: Right;   LOWER EXTREMITY ANGIOGRAPHY Left 11/30/2021   Procedure: Lower Extremity Angiography;  Surgeon: Algernon Huxley, MD;  Location: [place] [facility];  Service: Cardiovascular;  Laterality: Left;   LOWER EXTREMITY ANGIOGRAPHY Right 02/22/2022   Procedure: Lower Extremity Angiography;  Surgeon: Algernon Huxley, MD;  Location: [place] [facility];  Service: Cardiovascular;  Laterality: Right;   LOWER EXTREMITY ANGIOGRAPHY Right 03/13/2022   Procedure: Lower Extremity Angiography;  Surgeon: Algernon Huxley, MD;  Location: [place] [facility];  Service: Cardiovascular;  Laterality: Right;   LOWER EXTREMITY INTERVENTION N/A 11/19/2019   Procedure: LOWER EXTREMITY INTERVENTION;  Surgeon: Algernon Huxley, MD;  Location: [place] [facility];  Service: Cardiovascular;  Laterality: N/A;   LOWER EXTREMITY INTERVENTION Bilateral 06/29/2021   Procedure: LOWER EXTREMITY INTERVENTION;  Surgeon: Algernon Huxley, MD;  Location: [place] [facility];  Service: Cardiovascular;  Laterality: Bilateral;   TEE WITHOUT CARDIOVERSION N/A 06/22/2017   Procedure: TRANSESOPHAGEAL ECHOCARDIOGRAM (TEE);  Surgeon: Wellington Hampshire, MD;  Location: [facility];  Service: Cardiovascular;  Laterality: N/A;   TOTAL KNEE ARTHROPLASTY Right 07/11/2018   Procedure: TOTAL KNEE ARTHROPLASTY;  Surgeon: Corky Mull, MD;  Location: [facility];  Service: Orthopedics;  Laterality: Right;   VAGINAL HYSTERECTOMY     VISCERAL ARTERY INTERVENTION N/A 06/20/2017   Procedure: Visceral Artery Intervention, possible aortic thrombectomy;  Surgeon: Algernon Huxley, MD;  Location: [place] [facility];  Service: Cardiovascular;   Laterality: N/A;   VISCERAL ARTERY INTERVENTION N/A 01/28/2018   Procedure: VISCERAL ARTERY INTERVENTION;  Surgeon: Algernon Huxley, MD;  Location: [place] [facility];  Service: Cardiovascular;  Laterality: N/A;    Social History Social History   Tobacco Use   Smoking status: Former    Packs/day: 1.00    Types: Cigarettes   Smokeless tobacco: Never  Vaping Use   Vaping Use: Never used  Substance Use Topics   Alcohol use: No   Drug use: No    Family History Family History  Problem Relation Age of Onset   Hypertension  Mother    Heart disease Mother    Heart attack Mother    Breast cancer Mother    Hypertension Father    Breast cancer Paternal Aunt     Allergies  Allergen Reactions   Gabapentin     Tremors    Bactrim [Sulfamethoxazole-Trimethoprim] Itching     REVIEW OF SYSTEMS (Negative unless checked)  Constitutional: '[]'$ Weight loss  '[]'$ Fever  '[]'$ Chills Cardiac: '[]'$ Chest pain   '[]'$ Chest pressure   '[]'$ Palpitations   '[]'$ Shortness of breath when laying flat   '[]'$ Shortness of breath at rest   '[]'$ Shortness of breath with exertion. Vascular:  '[]'$ Pain in legs with walking   '[]'$ Pain in legs at rest   '[]'$ Pain in legs when laying flat   '[]'$ Claudication   '[]'$ Pain in feet when walking  '[x]'$ Pain in feet at rest  '[x]'$ Pain in feet when laying flat   '[]'$ History of DVT   '[]'$ Phlebitis   '[]'$ Swelling in legs   '[]'$ Varicose veins   '[]'$ Non-healing ulcers Pulmonary:   '[]'$ Uses home oxygen   '[]'$ Productive cough   '[]'$ Hemoptysis   '[]'$ Wheeze  '[]'$ COPD   '[]'$ Asthma Neurologic:  '[]'$ Dizziness  '[]'$ Blackouts   '[]'$ Seizures   '[]'$ History of stroke   '[]'$ History of TIA  '[]'$ Aphasia   '[]'$ Temporary blindness   '[]'$ Dysphagia   '[]'$ Weakness or numbness in arms   '[]'$ Weakness or numbness in legs Musculoskeletal:  '[]'$ Arthritis   '[]'$ Joint swelling   '[]'$ Joint pain   '[]'$ Low back pain Hematologic:  '[]'$ Easy bruising  '[]'$ Easy bleeding   '[]'$ Hypercoagulable state   '[]'$ Anemic  '[]'$ Hepatitis Gastrointestinal:  '[x]'$ Blood in stool   '[]'$ Vomiting blood  '[]'$ Gastroesophageal  reflux/heartburn   '[]'$ Difficulty swallowing. Genitourinary:  '[]'$ Chronic kidney disease   '[]'$ Difficult urination  '[]'$ Frequent urination  '[]'$ Burning with urination   '[]'$ Blood in urine Skin:  '[]'$ Rashes   '[]'$ Ulcers   '[]'$ Wounds Psychological:  '[]'$ History of anxiety   '[]'$  History of major depression.  Physical Examination  Vitals:   03/22/22 0600 03/22/22 0725 03/22/22 1520 03/22/22 1950  BP:  (!) 145/81 99/70 136/68  Pulse:  64 (!) 53 (!) 53  Resp:  '16 18 20  '$ Temp:  97.8 F (36.6 C) 98 F (36.7 C) 98 F (36.7 C)  TempSrc:  Oral Oral   SpO2:  91% 96% 98%  Weight: 101 kg     Height:       Body mass index is 40.73 kg/m. Gen:  WD/WN, NAD Head: [place]/AT, No temporalis wasting. Prominent temp pulse not noted. Ear/Nose/Throat: Hearing grossly intact, nares w/o erythema or drainage, oropharynx w/o Erythema/Exudate Eyes: Sclera non-icteric, conjunctiva clear Neck: Trachea midline.  No JVD.  Pulmonary:  Good air movement, respirations not labored, equal bilaterally.  Cardiac: RRR, normal S1, S2. Vascular:  Vessel Right Left  PT Not Palpable Palpable  DP Not Palpable Palpable   Gastrointestinal: soft, non-tender/non-distended. No guarding/reflex.  Musculoskeletal: M/S 5/5 throughout.  Extremities without ischemic changes.  No deformity or atrophy. No edema. Neurologic: Sensation grossly intact in extremities.  Symmetrical.  Speech is fluent. Motor exam as listed above. Psychiatric: Judgment intact, Mood & affect appropriate for pt's clinical situation.  Tearful Dermatologic: No rashes or ulcers noted.  No cellulitis or open wounds. Lymph : No Cervical, Axillary, or Inguinal lymphadenopathy.    CBC Lab Results  Component Value Date   WBC 5.8 03/22/2022   HGB 10.6 (L) 03/22/2022   HCT 34.5 (L) 03/22/2022   MCV 83.1 03/22/2022   PLT 274 03/22/2022    BMET    Component Value Date/Time   NA 138  03/21/2022 1714   NA 140 02/12/2015 1137   K 3.2 (L) 03/21/2022 1714   K 2.9 (L) 02/12/2015 1137    CL 101 03/21/2022 1714   CL 107 02/12/2015 1137   CO2 23 03/21/2022 1714   CO2 27 02/12/2015 1137   GLUCOSE 140 (H) 03/21/2022 1714   GLUCOSE 120 (H) 02/12/2015 1137   BUN 11 03/21/2022 1714   BUN 15 02/12/2015 1137   CREATININE 0.95 03/21/2022 1714   CREATININE 0.78 02/12/2015 1137   CALCIUM 9.0 03/21/2022 1714   CALCIUM 9.0 02/12/2015 1137   GFRNONAA >60 03/21/2022 1714   GFRNONAA >60 02/12/2015 1137   GFRAA >60 11/23/2019 1330   GFRAA >60 02/12/2015 1137   Estimated Creatinine Clearance: 68.3 mL/min (by C-G formula based on SCr of 0.95 mg/dL).  COAG Lab Results  Component Value Date   INR 0.9 06/27/2021   INR 1.1 11/23/2019   INR 1.0 11/07/2019    Radiology CT ABDOMEN PELVIS WO CONTRAST  Result Date: 03/21/2022 CLINICAL DATA:  Rectal bleeding for 4 days, loose stools, right lower quadrant pain EXAM: CT ABDOMEN AND PELVIS WITHOUT CONTRAST TECHNIQUE: Multidetector CT imaging of the abdomen and pelvis was performed following the standard protocol without IV contrast. Unenhanced CT was performed per clinician order. Lack of IV contrast limits sensitivity and specificity, especially for evaluation of abdominal/pelvic solid viscera and vascular structures. RADIATION DOSE REDUCTION: This exam was performed according to the departmental dose-optimization program which includes automated exposure control, adjustment of the mA and/or kV according to patient size and/or use of iterative reconstruction technique. COMPARISON:  10/01/2020 FINDINGS: Lower chest: No acute pleural or parenchymal lung disease. Hepatobiliary: Cholecystectomy. Unremarkable unenhanced appearance of the liver. Pancreas: Unremarkable unenhanced appearance. Spleen: Unremarkable unenhanced appearance. Adrenals/Urinary Tract: Chronic left adrenal thickening measuring up to 15 mm, with attenuation of -4 HU, consistent with adenoma or hyperplasia. No specific follow-up is required given stability and imaging features. Right  adrenal is unremarkable. No urinary tract calculi or obstructive uropathy within either kidney. Bladder is grossly normal. Stomach/Bowel: No bowel obstruction or ileus. Large right lower quadrant ventral hernia is again identified, containing multiple loops of large and small bowel. No evidence of incarceration or bowel obstruction. No bowel wall thickening or inflammatory change. Vascular/Lymphatic: Stable aortic atherosclerosis. Stable SMA stent and distal aortic endoluminal stent graft. Evaluation of the vascular lumen is limited without IV contrast. No pathologic adenopathy. Reproductive: Status post hysterectomy. No adnexal masses. Other: No free fluid or free intraperitoneal gas. Large right lower quadrant ventral hernia as described above. Musculoskeletal: No acute or destructive bony lesions. Reconstructed images demonstrate no additional findings. IMPRESSION: 1. Large right lower quadrant ventral hernia unchanged. No evidence of incarceration or bowel obstruction. 2. No acute intra-abdominal or intrapelvic process. 3.  Aortic Atherosclerosis (ICD10-I70.0). Electronically Signed   By: Randa Ngo M.D.   On: 03/21/2022 21:37   PERIPHERAL VASCULAR CATHETERIZATION  Result Date: 03/13/2022 See surgical note for result.  PERIPHERAL VASCULAR CATHETERIZATION  Result Date: 02/22/2022 See surgical note for result.  VAS [place] ABI WITH/WO TBI  Result Date: 03/09/2022  LOWER EXTREMITY DOPPLER STUDY Patient Name:  Carla [name]  Date of Exam:   03/07/2022 Medical Rec #: [id number]      Accession #:    [id number] Date of Birth: [birth year]       Patient Gender: F Patient Age:   71 years Exam Location:  [facility] Procedure:      VAS [place] ABI WITH/WO TBI Referring  Phys: Eulogio Ditch --------------------------------------------------------------------------------  Indications: LLE pain.  Vascular Interventions: 12/13/20: right cia,eia thrombectomy                         01/10/21: rt thrombectomy new bilat  kissing stents in                         cia's, addtl rt eia stent;                         01/24/21: repeat thrombectomy right;                         06/29/21: Right CIA/EIA thrombectomy with bilateral CIA                         PTAs;                         11/28/21: Left CIA/EIA thrombectomy/PTA;                         12/01/2021: Left CFA,PFA and SFA Endartectomies and                         patch. Performing Technologist: Blondell Reveal RT, RDMS, RVT  Examination Guidelines: A complete evaluation includes at minimum, Doppler waveform signals and systolic blood pressure reading at the level of bilateral brachial, anterior tibial, and posterior tibial arteries, when vessel segments are accessible. Bilateral testing is considered an integral part of a complete examination. Photoelectric Plethysmograph (PPG) waveforms and toe systolic pressure readings are included as required and additional duplex testing as needed. Limited examinations for reoccurring indications may be performed as noted.  ABI Findings: +---------+------------------+-----+------------+--------+ Right    Rt Pressure (mmHg)IndexWaveform    Comment  +---------+------------------+-----+------------+--------+ Brachial 136                                         +---------+------------------+-----+------------+--------+ ATA      64                0.46 monophasic           +---------+------------------+-----+------------+--------+ PTA                             not detected         +---------+------------------+-----+------------+--------+ PERO     62                0.45 monophasic           +---------+------------------+-----+------------+--------+ Great Toe40                0.29 Abnormal             +---------+------------------+-----+------------+--------+ +---------+------------------+-----+----------+-------+ Left     Lt Pressure (mmHg)IndexWaveform  Comment  +---------+------------------+-----+----------+-------+ Brachial 139                                      +---------+------------------+-----+----------+-------+ ATA      103  0.74 monophasic        +---------+------------------+-----+----------+-------+ PTA      67                0.48 monophasic        +---------+------------------+-----+----------+-------+ Great Toe58                0.42 Normal            +---------+------------------+-----+----------+-------+ +-------+-----------+-----------+------------+------------+ ABI/TBIToday's ABIToday's TBIPrevious ABIPrevious TBI +-------+-----------+-----------+------------+------------+ Right  0.46       0.29       0.63        0.61         +-------+-----------+-----------+------------+------------+ Left   0.74       0.42       0.75        0.75         +-------+-----------+-----------+------------+------------+ Right ABIs appear decreased compared to prior study on 02/15/22. Left ABIs appear essentially unchanged.  Summary: Right: Resting right ankle-brachial index indicates severe right lower extremity arterial disease. The right toe-brachial index is abnormal. Left: Resting left ankle-brachial index indicates moderate left lower extremity arterial disease. The left toe-brachial index is abnormal. *See table(s) above for measurements and observations.  Electronically signed by [name] MD on 03/09/2022 at 12:24:25 PM.    Final    VAS [place] LOWER EXTREMITY ARTERIAL DUPLEX  Result Date: 03/22/2022 LOWER EXTREMITY ARTERIAL DUPLEX STUDY Patient Name:  Carla [name]  Date of Exam:   03/21/2022 Medical Rec #: [id number]      Accession #:    [id number] Date of Birth: [birth year]       Patient Gender: F Patient Age:   10 years Exam Location:  [facility] Procedure:      VAS [place] LOWER EXTREMITY ARTERIAL DUPLEX Referring Phys: [name] DEW --------------------------------------------------------------------------------   Indications: LLE pain.  Vascular Interventions: 12/13/20: right cia,eia thrombectomy                         01/10/21: rt thrombectomy new bilat kissing stents in                         cia's, addtl rt eia stent;                         01/24/21: repeat thrombectomy right;                         06/29/21: Right CIA/EIA thrombectomy with bilateral CIA                         PTAs;                         11/28/21: Left CIA/EIA thrombectomy/PTA;                         12/01/2021: Left CFA,PFA and SFA Endartectomies and                         patch.                          03/13/2022: Aortogram and Selective Bilateral  Iliofemoral Arteriogram. PTA of the Left CIA with 7 mm                         diameter angioplasty balloon. Mechanical Thrombectomy                         using the penumbra CAT 7D catheter to the Right CIA and                         EIA. PTA of the Right CIA and EIA with 8 mm diameter                         angioplasty balloon. Current ABI:            Not obtained Performing Technologist: Almira Coaster RVS  Examination Guidelines: A complete evaluation includes B-mode imaging, spectral Doppler, color Doppler, and power Doppler as needed of all accessible portions of each vessel. Bilateral testing is considered an integral part of a complete examination. Limited examinations for reoccurring indications may be performed as noted.  +-----------+--------+-----+--------+----------+--------+ RIGHT      PSV cm/sRatioStenosisWaveform  Comments +-----------+--------+-----+--------+----------+--------+ EIA Distal 54                   monophasic         +-----------+--------+-----+--------+----------+--------+ CFA Distal 40                   monophasic         +-----------+--------+-----+--------+----------+--------+ DFA        41                   monophasic         +-----------+--------+-----+--------+----------+--------+ SFA Prox   51                    monophasic         +-----------+--------+-----+--------+----------+--------+ SFA Mid    44                   monophasic         +-----------+--------+-----+--------+----------+--------+ SFA Distal 34                   monophasic         +-----------+--------+-----+--------+----------+--------+ POP Distal 25                   monophasic         +-----------+--------+-----+--------+----------+--------+ ATA Distal 9                    monophasic         +-----------+--------+-----+--------+----------+--------+ PTA Distal 13                   monophasic         +-----------+--------+-----+--------+----------+--------+ PERO Distal25                   monophasic         +-----------+--------+-----+--------+----------+--------+  Summary: Right: Imaging and Waveforms obtained throughout in the Right Lower Extremity. Monophasic flow seen throughout in the Right Lower Extremity.  See table(s) above for measurements and observations. Electronically signed by [name] MD on 03/22/2022 at 9:20:14 AM.    Final       Assessment/Plan 1.  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  The patient just underwent intervention a week ago and she has reoccluded her intervention in a very short timeframe, which is greatly concerning about the patency durability of interventions going forward.  Complicating intervention the patient has a GI bleed, and continued anticoagulation may become life-threatening if she continues to have recurrent GI bleeds.  However, if the patient does not have some sort of anticoagulation, any intervention we have is likely to fail.  We will plan on attempting revascularization, when she is stable from a GI standpoint, however if we have a  subsequent failure, amputation will be strongly considered.  An attempt to maintain patency following intervention, the patient will likely need alternative anticoagulation consider given her recent failures with Eliquis.  2.  Acute GI bleed The patient noted at her office visit that she has had bright red bleeding from her rectum for the last 4 to 5 days.  Given the fact that the patient is currently ischemic this poses a complicated matter because the patient may need tPA to help treat the ischemia of her lower limb.  The patient will most definitely need some sort of anticoagulation going forward in order to maintain patency of interventions.  Therefore, before we can proceed with any intervention, she was be stable from a GI standpoint.  The patient noted that she has upcoming colonoscopy tomorrow.    Family Communication: Grandson at bedside   Kris Hartmann, NP [facility] [phone] (Office Phone) [phone] (Office Fax)  03/22/2022 11:34 PM    This note was created with Dragon medical transcription system.  Any error is purely unintentional

## 2022-03-22 NOTE — Interval H&P Note (Signed)
History and Physical Interval Note:  03/22/2022 12:02 PM  Carla [name]  has presented today for surgery, with the diagnosis of RLE Angio   BARD   ASO w rest pain.  The various methods of treatment have been discussed with the patient and family. After consideration of risks, benefits and other options for treatment, the patient has consented to  Procedure(s): Lower Extremity Angiography (Right) as a surgical intervention.  The patient's history has been reviewed, patient examined, no change in status, stable for surgery.  I have reviewed the patient's chart and labs.  Questions were answered to the patient's satisfaction.     [name]

## 2022-03-22 NOTE — H&P (View-Only) (Signed)
[name] V[facility] Consult Note  MRN : [id number]  Carla [name] is a 61 y.o. (10-25-1960) [name] who presents with chief complaint of No chief complaint on file. .   Consulting Physician: Francine Graven, MD Reason for consult: Ischemic right lower extremity History of Present Illness: Carla [name] is a 61 year old [name] who is well-known to our practice multiple vascular interventions.  The patient has a previous medical history of hypertension, hyperlipidemia, ventral hernia, paroxysmal atrial fibrillation and mesenteric ischemia.  The patient presented to our office yesterday with complaints of severe right lower extremity pain despite revascularization on 03/13/2022.  It was found that the patient had dampened flow throughout the right lower extremity which suggested a reocclusion of her iliac artery.  Complicating the patient's situation she noted that she had been bleeding bright red blood for the last 4 to 5 days per rectum.  This drastically complicates the revascularization options in addition to the possible patency of any further interventions.  Patient has a known ventral hernia and previously when her anticoagulation was stopped for possible surgery, the patient had a heart attack as well as an ischemic limb.  Current Facility-Administered Medications  Medication Dose Route Frequency Provider Last Rate Last Admin   0.9 %  sodium chloride infusion   Intravenous Continuous Jonathon Bellows, MD       acetaminophen (TYLENOL) tablet 650 mg  650 mg Oral Q6H PRN Agbata, Tochukwu, MD       Or   acetaminophen (TYLENOL) suppository 650 mg  650 mg Rectal Q6H PRN Agbata, Tochukwu, MD       atorvastatin (LIPITOR) tablet 80 mg  80 mg Oral QHS Agbata, Tochukwu, MD   80 mg at 03/22/22 2145   cephALEXin (KEFLEX) capsule 500 mg  500 mg Oral Q12H Fritzi Mandes, MD   500 mg at 03/22/22 1803   citalopram (CELEXA) tablet 40 mg  40 mg Oral Daily Agbata, Tochukwu, MD   40 mg at 03/22/22 5456   feeding  supplement (BOOST / RESOURCE BREEZE) liquid 1 Container  1 Container Oral TID BM Fritzi Mandes, MD   1 Container at 03/22/22 2144   iohexol (OMNIPAQUE) 9 MG/ML oral solution 500 mL  500 mL Oral Once PRN Agbata, Tochukwu, MD   500 mL at 03/21/22 1740   ipratropium-albuterol (DUONEB) 0.5-2.5 (3) MG/3ML nebulizer solution 3 mL  3 mL Nebulization Daily PRN Agbata, Tochukwu, MD       isosorbide mononitrate (IMDUR) 24 hr tablet 60 mg  60 mg Oral Daily Agbata, Tochukwu, MD   60 mg at 03/22/22 0907   metoprolol tartrate (LOPRESSOR) tablet 50 mg  50 mg Oral BID Agbata, Tochukwu, MD   50 mg at 03/22/22 2145   morphine (PF) 2 MG/ML injection 1 mg  1 mg Intravenous Q4H PRN Agbata, Tochukwu, MD   1 mg at 03/22/22 2155   nitroGLYCERIN (NITROSTAT) SL tablet 0.4 mg  0.4 mg Sublingual Q5 min PRN Agbata, Tochukwu, MD       ondansetron (ZOFRAN) tablet 4 mg  4 mg Oral Q6H PRN Agbata, Tochukwu, MD       Or   ondansetron (ZOFRAN) injection 4 mg  4 mg Intravenous Q6H PRN Agbata, Tochukwu, MD       pantoprazole (PROTONIX) injection 40 mg  40 mg Intravenous Q24H Agbata, Tochukwu, MD   40 mg at 03/22/22 1757   pregabalin (LYRICA) capsule 100 mg  100 mg Oral TID Collier Bullock, MD   100 mg at 03/22/22 2145  Past Medical History:  Diagnosis Date   Abdominal aortic atherosclerosis ([place])    a. 05/2017 CTA abd/pelvis: significant atherosclerotic dzs of the infrarenal abd Ao w/ some mural thrombus. No aneurysm or dissection.   Acute focal ischemia of small intestine (HCC)    Acute right-sided low back pain with right-sided sciatica 06/13/2017   AKI (acute kidney injury) ([place]) 07/29/2017   Anginal pain ([place])    a. 08/2012 Lexiscan MV: EF 54%, non ischemia/infarct.   Anxiety and depression    Baker's cyst of knee, right    a. 07/2016 U/S: 4.1 x 1.4 x 2.9 cystic structure in R poplitetal fossa.   Bell's palsy    Borderline diabetes    CHF (congestive heart failure) (HCC)    Chronic anticoagulation    apixaban   Chronic  idiopathic constipation    Chronic mesenteric ischemia (HCC)    Chronic pain    COPD (chronic obstructive pulmonary disease) (HCC)    Diastolic dysfunction    a. 05/2017 Echo: Ef 60-65%, no rwma, Gr1 DD, no source of cardiac emboli.   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 05/2017 CTA Abd/pelvis: apparent thrombus or embolus in prox SMA (70-90%); b. 05/2017 catheter directed tPA, mechanical thrombectomy, and stenting of the SMA.   Essential hypertension with goal blood pressure less than 140/90 01/19/2016   Gastroesophageal reflux disease 01/19/2016   GERD (gastroesophageal reflux disease)    H/O colectomy    History of 2019 novel coronavirus disease (COVID-19) 11/14/2019   History of kidney stones    Hyperlipidemia    Hypertension    Morbid obesity with BMI of 40.0-44.9, adult ([place])    Occlusive mesenteric ischemia ([place]) 06/19/2017   Osteoarthritis    PAD (peripheral artery disease) (HCC)    PAF (paroxysmal atrial fibrillation) (HCC)    Palpitations    Peripheral edema    Personal history of other malignant neoplasm of skin 06/01/2016   Pneumonia    Pre-diabetes    Primary osteoarthritis of both knees 06/13/2017   SBO (small bowel obstruction) ([place]) 08/06/2017   Skin cancer of nose    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HCC)    Vitamin D deficiency     Past Surgical History:  Procedure Laterality Date   AMPUTATION TOE Left 12/09/2019   Procedure: AMPUTATION TOE MPJ left;  Surgeon: Caroline More, DPM;  Location: [facility];  Service: Podiatry;  Laterality: Left;   APPENDECTOMY     APPLICATION OF WOUND VAC  12/01/2021   Procedure: APPLICATION OF WOUND VAC;  Surgeon: Algernon Huxley, MD;  Location: [facility];  Service: Vascular;;   CHOLECYSTECTOMY     COLON SURGERY     EMBOLECTOMY OR THROMBECTOMY, WITH OR WITHOUT CATHETER; FEMOROPOPLITEAL, AORTOILIAC ARTERY, BY LEG INCISION N/A 06/21/2020   Left - Decomp Fasciotomy Leg; Ant &/Or Lat Compart Only; Midline -  Angiography, Visceral, Selective Or Supraselective (With Or Without Flush Aortogram); Left - Revascularize, Endovasc, Open/Percut, Iliac Artery, Unilat, Initial Vessel; W/Translum Stent, W/Angioplasty; Right - Revascularize, Endovasc, Open/Percut, Iliac Artery, Ea Add`L Ipsilateral; W/Translumin Stent, W/Angioplasty; Location [facility];   ENDARTERECTOMY FEMORAL Left 12/01/2021   Procedure: ENDARTERECTOMY FEMORAL;  Surgeon: Algernon Huxley, MD;  Location: [facility];  Service: Vascular;  Laterality: Left;   ENDOVASCULAR REPAIR/STENT GRAFT Left 12/01/2021   Procedure: ENDOVASCULAR REPAIR/STENT GRAFT;  Surgeon: Algernon Huxley, MD;  Location: [place] [facility];  Service: Cardiovascular;  Laterality: Left;   LAPAROTOMY N/A 08/08/2017   Procedure:  EXPLORATORY LAPAROTOMY POSSIBLE BOWEL RESECTION;  Surgeon: Jules Husbands, MD;  Location: [facility];  Service: General;  Laterality: N/A;   LEFT HEART CATH AND CORONARY ANGIOGRAPHY N/A 05/17/2021   Procedure: LEFT HEART CATH AND CORONARY ANGIOGRAPHY;  Surgeon: Yolonda Kida, MD;  Location: [place] [facility];  Service: Cardiovascular;  Laterality: N/A;   LOWER EXTREMITY ANGIOGRAPHY Left 11/17/2019   Procedure: LOWER EXTREMITY ANGIOGRAPHY;  Surgeon: Algernon Huxley, MD;  Location: [place] [facility];  Service: Cardiovascular;  Laterality: Left;   LOWER EXTREMITY ANGIOGRAPHY Right 12/13/2020   Procedure: LOWER EXTREMITY ANGIOGRAPHY;  Surgeon: Algernon Huxley, MD;  Location: [place] [facility];  Service: Cardiovascular;  Laterality: Right;   LOWER EXTREMITY ANGIOGRAPHY Right 01/10/2021   Procedure: LOWER EXTREMITY ANGIOGRAPHY;  Surgeon: Algernon Huxley, MD;  Location: [place] [facility];  Service: Cardiovascular;  Laterality: Right;   LOWER EXTREMITY ANGIOGRAPHY Right 01/24/2021   Procedure: LOWER EXTREMITY ANGIOGRAPHY;  Surgeon: Algernon Huxley, MD;  Location: [place] [facility];  Service: Cardiovascular;  Laterality: Right;   LOWER EXTREMITY ANGIOGRAPHY Bilateral 06/28/2021    Procedure: Lower Extremity Angiography;  Surgeon: Algernon Huxley, MD;  Location: [place] [facility];  Service: Cardiovascular;  Laterality: Bilateral;   LOWER EXTREMITY ANGIOGRAPHY Right 11/28/2021   Procedure: LOWER EXTREMITY ANGIOGRAPHY;  Surgeon: Algernon Huxley, MD;  Location: [place] [facility];  Service: Cardiovascular;  Laterality: Right;   LOWER EXTREMITY ANGIOGRAPHY Left 11/30/2021   Procedure: Lower Extremity Angiography;  Surgeon: Algernon Huxley, MD;  Location: [place] [facility];  Service: Cardiovascular;  Laterality: Left;   LOWER EXTREMITY ANGIOGRAPHY Right 02/22/2022   Procedure: Lower Extremity Angiography;  Surgeon: Algernon Huxley, MD;  Location: [place] [facility];  Service: Cardiovascular;  Laterality: Right;   LOWER EXTREMITY ANGIOGRAPHY Right 03/13/2022   Procedure: Lower Extremity Angiography;  Surgeon: Algernon Huxley, MD;  Location: [place] [facility];  Service: Cardiovascular;  Laterality: Right;   LOWER EXTREMITY INTERVENTION N/A 11/19/2019   Procedure: LOWER EXTREMITY INTERVENTION;  Surgeon: Algernon Huxley, MD;  Location: [place] [facility];  Service: Cardiovascular;  Laterality: N/A;   LOWER EXTREMITY INTERVENTION Bilateral 06/29/2021   Procedure: LOWER EXTREMITY INTERVENTION;  Surgeon: Algernon Huxley, MD;  Location: [place] [facility];  Service: Cardiovascular;  Laterality: Bilateral;   TEE WITHOUT CARDIOVERSION N/A 06/22/2017   Procedure: TRANSESOPHAGEAL ECHOCARDIOGRAM (TEE);  Surgeon: Wellington Hampshire, MD;  Location: [facility];  Service: Cardiovascular;  Laterality: N/A;   TOTAL KNEE ARTHROPLASTY Right 07/11/2018   Procedure: TOTAL KNEE ARTHROPLASTY;  Surgeon: Corky Mull, MD;  Location: [facility];  Service: Orthopedics;  Laterality: Right;   VAGINAL HYSTERECTOMY     VISCERAL ARTERY INTERVENTION N/A 06/20/2017   Procedure: Visceral Artery Intervention, possible aortic thrombectomy;  Surgeon: Algernon Huxley, MD;  Location: [place] [facility];  Service: Cardiovascular;   Laterality: N/A;   VISCERAL ARTERY INTERVENTION N/A 01/28/2018   Procedure: VISCERAL ARTERY INTERVENTION;  Surgeon: Algernon Huxley, MD;  Location: [place] [facility];  Service: Cardiovascular;  Laterality: N/A;    Social History Social History   Tobacco Use   Smoking status: Former    Packs/day: 1.00    Types: Cigarettes   Smokeless tobacco: Never  Vaping Use   Vaping Use: Never used  Substance Use Topics   Alcohol use: No   Drug use: No    Family History Family History  Problem Relation Age of Onset   Hypertension  Mother    Heart disease Mother    Heart attack Mother    Breast cancer Mother    Hypertension Father    Breast cancer Paternal Aunt     Allergies  Allergen Reactions   Gabapentin     Tremors    Bactrim [Sulfamethoxazole-Trimethoprim] Itching     REVIEW OF SYSTEMS (Negative unless checked)  Constitutional: '[]'$ Weight loss  '[]'$ Fever  '[]'$ Chills Cardiac: '[]'$ Chest pain   '[]'$ Chest pressure   '[]'$ Palpitations   '[]'$ Shortness of breath when laying flat   '[]'$ Shortness of breath at rest   '[]'$ Shortness of breath with exertion. Vascular:  '[]'$ Pain in legs with walking   '[]'$ Pain in legs at rest   '[]'$ Pain in legs when laying flat   '[]'$ Claudication   '[]'$ Pain in feet when walking  '[x]'$ Pain in feet at rest  '[x]'$ Pain in feet when laying flat   '[]'$ History of DVT   '[]'$ Phlebitis   '[]'$ Swelling in legs   '[]'$ Varicose veins   '[]'$ Non-healing ulcers Pulmonary:   '[]'$ Uses home oxygen   '[]'$ Productive cough   '[]'$ Hemoptysis   '[]'$ Wheeze  '[]'$ COPD   '[]'$ Asthma Neurologic:  '[]'$ Dizziness  '[]'$ Blackouts   '[]'$ Seizures   '[]'$ History of stroke   '[]'$ History of TIA  '[]'$ Aphasia   '[]'$ Temporary blindness   '[]'$ Dysphagia   '[]'$ Weakness or numbness in arms   '[]'$ Weakness or numbness in legs Musculoskeletal:  '[]'$ Arthritis   '[]'$ Joint swelling   '[]'$ Joint pain   '[]'$ Low back pain Hematologic:  '[]'$ Easy bruising  '[]'$ Easy bleeding   '[]'$ Hypercoagulable state   '[]'$ Anemic  '[]'$ Hepatitis Gastrointestinal:  '[x]'$ Blood in stool   '[]'$ Vomiting blood  '[]'$ Gastroesophageal  reflux/heartburn   '[]'$ Difficulty swallowing. Genitourinary:  '[]'$ Chronic kidney disease   '[]'$ Difficult urination  '[]'$ Frequent urination  '[]'$ Burning with urination   '[]'$ Blood in urine Skin:  '[]'$ Rashes   '[]'$ Ulcers   '[]'$ Wounds Psychological:  '[]'$ History of anxiety   '[]'$  History of major depression.  Physical Examination  Vitals:   03/22/22 0600 03/22/22 0725 03/22/22 1520 03/22/22 1950  BP:  (!) 145/81 99/70 136/68  Pulse:  64 (!) 53 (!) 53  Resp:  '16 18 20  '$ Temp:  97.8 F (36.6 C) 98 F (36.7 C) 98 F (36.7 C)  TempSrc:  Oral Oral   SpO2:  91% 96% 98%  Weight: 101 kg     Height:       Body mass index is 40.73 kg/m. Gen:  WD/WN, NAD Head: [place]/AT, No temporalis wasting. Prominent temp pulse not noted. Ear/Nose/Throat: Hearing grossly intact, nares w/o erythema or drainage, oropharynx w/o Erythema/Exudate Eyes: Sclera non-icteric, conjunctiva clear Neck: Trachea midline.  No JVD.  Pulmonary:  Good air movement, respirations not labored, equal bilaterally.  Cardiac: RRR, normal S1, S2. Vascular:  Vessel Right Left  PT Not Palpable Palpable  DP Not Palpable Palpable   Gastrointestinal: soft, non-tender/non-distended. No guarding/reflex.  Musculoskeletal: M/S 5/5 throughout.  Extremities without ischemic changes.  No deformity or atrophy. No edema. Neurologic: Sensation grossly intact in extremities.  Symmetrical.  Speech is fluent. Motor exam as listed above. Psychiatric: Judgment intact, Mood & affect appropriate for pt's clinical situation.  Tearful Dermatologic: No rashes or ulcers noted.  No cellulitis or open wounds. Lymph : No Cervical, Axillary, or Inguinal lymphadenopathy.    CBC Lab Results  Component Value Date   WBC 5.8 03/22/2022   HGB 10.6 (L) 03/22/2022   HCT 34.5 (L) 03/22/2022   MCV 83.1 03/22/2022   PLT 274 03/22/2022    BMET    Component Value Date/Time   NA 138  03/21/2022 1714   NA 140 02/12/2015 1137   K 3.2 (L) 03/21/2022 1714   K 2.9 (L) 02/12/2015 1137    CL 101 03/21/2022 1714   CL 107 02/12/2015 1137   CO2 23 03/21/2022 1714   CO2 27 02/12/2015 1137   GLUCOSE 140 (H) 03/21/2022 1714   GLUCOSE 120 (H) 02/12/2015 1137   BUN 11 03/21/2022 1714   BUN 15 02/12/2015 1137   CREATININE 0.95 03/21/2022 1714   CREATININE 0.78 02/12/2015 1137   CALCIUM 9.0 03/21/2022 1714   CALCIUM 9.0 02/12/2015 1137   GFRNONAA >60 03/21/2022 1714   GFRNONAA >60 02/12/2015 1137   GFRAA >60 11/23/2019 1330   GFRAA >60 02/12/2015 1137   Estimated Creatinine Clearance: 68.3 mL/min (by C-G formula based on SCr of 0.95 mg/dL).  COAG Lab Results  Component Value Date   INR 0.9 06/27/2021   INR 1.1 11/23/2019   INR 1.0 11/07/2019    Radiology CT ABDOMEN PELVIS WO CONTRAST  Result Date: 03/21/2022 CLINICAL DATA:  Rectal bleeding for 4 days, loose stools, right lower quadrant pain EXAM: CT ABDOMEN AND PELVIS WITHOUT CONTRAST TECHNIQUE: Multidetector CT imaging of the abdomen and pelvis was performed following the standard protocol without IV contrast. Unenhanced CT was performed per clinician order. Lack of IV contrast limits sensitivity and specificity, especially for evaluation of abdominal/pelvic solid viscera and vascular structures. RADIATION DOSE REDUCTION: This exam was performed according to the departmental dose-optimization program which includes automated exposure control, adjustment of the mA and/or kV according to patient size and/or use of iterative reconstruction technique. COMPARISON:  10/01/2020 FINDINGS: Lower chest: No acute pleural or parenchymal lung disease. Hepatobiliary: Cholecystectomy. Unremarkable unenhanced appearance of the liver. Pancreas: Unremarkable unenhanced appearance. Spleen: Unremarkable unenhanced appearance. Adrenals/Urinary Tract: Chronic left adrenal thickening measuring up to 15 mm, with attenuation of -4 HU, consistent with adenoma or hyperplasia. No specific follow-up is required given stability and imaging features. Right  adrenal is unremarkable. No urinary tract calculi or obstructive uropathy within either kidney. Bladder is grossly normal. Stomach/Bowel: No bowel obstruction or ileus. Large right lower quadrant ventral hernia is again identified, containing multiple loops of large and small bowel. No evidence of incarceration or bowel obstruction. No bowel wall thickening or inflammatory change. Vascular/Lymphatic: Stable aortic atherosclerosis. Stable SMA stent and distal aortic endoluminal stent graft. Evaluation of the vascular lumen is limited without IV contrast. No pathologic adenopathy. Reproductive: Status post hysterectomy. No adnexal masses. Other: No free fluid or free intraperitoneal gas. Large right lower quadrant ventral hernia as described above. Musculoskeletal: No acute or destructive bony lesions. Reconstructed images demonstrate no additional findings. IMPRESSION: 1. Large right lower quadrant ventral hernia unchanged. No evidence of incarceration or bowel obstruction. 2. No acute intra-abdominal or intrapelvic process. 3.  Aortic Atherosclerosis (ICD10-I70.0). Electronically Signed   By: Randa Ngo M.D.   On: 03/21/2022 21:37   PERIPHERAL VASCULAR CATHETERIZATION  Result Date: 03/13/2022 See surgical note for result.  PERIPHERAL VASCULAR CATHETERIZATION  Result Date: 02/22/2022 See surgical note for result.  VAS [place] ABI WITH/WO TBI  Result Date: 03/09/2022  LOWER EXTREMITY DOPPLER STUDY Patient Name:  Carla [name]  Date of Exam:   03/07/2022 Medical Rec #: [id number]      Accession #:    [id number] Date of Birth: [birth year]       Patient Gender: F Patient Age:   25 years Exam Location:  [facility] Procedure:      VAS [place] ABI WITH/WO TBI Referring  Phys: Eulogio Ditch --------------------------------------------------------------------------------  Indications: LLE pain.  Vascular Interventions: 12/13/20: right cia,eia thrombectomy                         01/10/21: rt thrombectomy new bilat  kissing stents in                         cia's, addtl rt eia stent;                         01/24/21: repeat thrombectomy right;                         06/29/21: Right CIA/EIA thrombectomy with bilateral CIA                         PTAs;                         11/28/21: Left CIA/EIA thrombectomy/PTA;                         12/01/2021: Left CFA,PFA and SFA Endartectomies and                         patch. Performing Technologist: Blondell Reveal RT, RDMS, RVT  Examination Guidelines: A complete evaluation includes at minimum, Doppler waveform signals and systolic blood pressure reading at the level of bilateral brachial, anterior tibial, and posterior tibial arteries, when vessel segments are accessible. Bilateral testing is considered an integral part of a complete examination. Photoelectric Plethysmograph (PPG) waveforms and toe systolic pressure readings are included as required and additional duplex testing as needed. Limited examinations for reoccurring indications may be performed as noted.  ABI Findings: +---------+------------------+-----+------------+--------+ Right    Rt Pressure (mmHg)IndexWaveform    Comment  +---------+------------------+-----+------------+--------+ Brachial 136                                         +---------+------------------+-----+------------+--------+ ATA      64                0.46 monophasic           +---------+------------------+-----+------------+--------+ PTA                             not detected         +---------+------------------+-----+------------+--------+ PERO     62                0.45 monophasic           +---------+------------------+-----+------------+--------+ Great Toe40                0.29 Abnormal             +---------+------------------+-----+------------+--------+ +---------+------------------+-----+----------+-------+ Left     Lt Pressure (mmHg)IndexWaveform  Comment  +---------+------------------+-----+----------+-------+ Brachial 139                                      +---------+------------------+-----+----------+-------+ ATA      103  0.74 monophasic        +---------+------------------+-----+----------+-------+ PTA      67                0.48 monophasic        +---------+------------------+-----+----------+-------+ Great Toe58                0.42 Normal            +---------+------------------+-----+----------+-------+ +-------+-----------+-----------+------------+------------+ ABI/TBIToday's ABIToday's TBIPrevious ABIPrevious TBI +-------+-----------+-----------+------------+------------+ Right  0.46       0.29       0.63        0.61         +-------+-----------+-----------+------------+------------+ Left   0.74       0.42       0.75        0.75         +-------+-----------+-----------+------------+------------+ Right ABIs appear decreased compared to prior study on 02/15/22. Left ABIs appear essentially unchanged.  Summary: Right: Resting right ankle-brachial index indicates severe right lower extremity arterial disease. The right toe-brachial index is abnormal. Left: Resting left ankle-brachial index indicates moderate left lower extremity arterial disease. The left toe-brachial index is abnormal. *See table(s) above for measurements and observations.  Electronically signed by [name] MD on 03/09/2022 at 12:24:25 PM.    Final    VAS [place] LOWER EXTREMITY ARTERIAL DUPLEX  Result Date: 03/22/2022 LOWER EXTREMITY ARTERIAL DUPLEX STUDY Patient Name:  Carla [name]  Date of Exam:   03/21/2022 Medical Rec #: [id number]      Accession #:    [id number] Date of Birth: 1961/07/23       Patient Gender: F Patient Age:   9 years Exam Location:  [facility] Procedure:      VAS [place] LOWER EXTREMITY ARTERIAL DUPLEX Referring Phys: [name] DEW --------------------------------------------------------------------------------   Indications: LLE pain.  Vascular Interventions: 12/13/20: right cia,eia thrombectomy                         01/10/21: rt thrombectomy new bilat kissing stents in                         cia's, addtl rt eia stent;                         01/24/21: repeat thrombectomy right;                         06/29/21: Right CIA/EIA thrombectomy with bilateral CIA                         PTAs;                         11/28/21: Left CIA/EIA thrombectomy/PTA;                         12/01/2021: Left CFA,PFA and SFA Endartectomies and                         patch.                          03/13/2022: Aortogram and Selective Bilateral  Iliofemoral Arteriogram. PTA of the Left CIA with 7 mm                         diameter angioplasty balloon. Mechanical Thrombectomy                         using the penumbra CAT 7D catheter to the Right CIA and                         EIA. PTA of the Right CIA and EIA with 8 mm diameter                         angioplasty balloon. Current ABI:            Not obtained Performing Technologist: Almira Coaster RVS  Examination Guidelines: A complete evaluation includes B-mode imaging, spectral Doppler, color Doppler, and power Doppler as needed of all accessible portions of each vessel. Bilateral testing is considered an integral part of a complete examination. Limited examinations for reoccurring indications may be performed as noted.  +-----------+--------+-----+--------+----------+--------+ RIGHT      PSV cm/sRatioStenosisWaveform  Comments +-----------+--------+-----+--------+----------+--------+ EIA Distal 54                   monophasic         +-----------+--------+-----+--------+----------+--------+ CFA Distal 40                   monophasic         +-----------+--------+-----+--------+----------+--------+ DFA        41                   monophasic         +-----------+--------+-----+--------+----------+--------+ SFA Prox   51                    monophasic         +-----------+--------+-----+--------+----------+--------+ SFA Mid    44                   monophasic         +-----------+--------+-----+--------+----------+--------+ SFA Distal 34                   monophasic         +-----------+--------+-----+--------+----------+--------+ POP Distal 25                   monophasic         +-----------+--------+-----+--------+----------+--------+ ATA Distal 9                    monophasic         +-----------+--------+-----+--------+----------+--------+ PTA Distal 13                   monophasic         +-----------+--------+-----+--------+----------+--------+ PERO Distal25                   monophasic         +-----------+--------+-----+--------+----------+--------+  Summary: Right: Imaging and Waveforms obtained throughout in the Right Lower Extremity. Monophasic flow seen throughout in the Right Lower Extremity.  See table(s) above for measurements and observations. Electronically signed by [name] MD on 03/22/2022 at 9:20:14 AM.    Final       Assessment/Plan 1.  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  The patient just underwent intervention a week ago and she has reoccluded her intervention in a very short timeframe, which is greatly concerning about the patency durability of interventions going forward.  Complicating intervention the patient has a GI bleed, and continued anticoagulation may become life-threatening if she continues to have recurrent GI bleeds.  However, if the patient does not have some sort of anticoagulation, any intervention we have is likely to fail.  We will plan on attempting revascularization, when she is stable from a GI standpoint, however if we have a  subsequent failure, amputation will be strongly considered.  An attempt to maintain patency following intervention, the patient will likely need alternative anticoagulation consider given her recent failures with Eliquis.  2.  Acute GI bleed The patient noted at her office visit that she has had bright red bleeding from her rectum for the last 4 to 5 days.  Given the fact that the patient is currently ischemic this poses a complicated matter because the patient may need tPA to help treat the ischemia of her lower limb.  The patient will most definitely need some sort of anticoagulation going forward in order to maintain patency of interventions.  Therefore, before we can proceed with any intervention, she was be stable from a GI standpoint.  The patient noted that she has upcoming colonoscopy tomorrow.    Family Communication: Grandson at bedside   Kris Hartmann, NP [facility] [phone] (Office Phone) [phone] (Office Fax)  03/22/2022 11:34 PM    This note was created with Dragon medical transcription system.  Any error is purely unintentional

## 2022-03-22 NOTE — H&P (Signed)
[facility] History & Physical Update  The patient was interviewed and re-examined.  The patient's previous History and Physical has been reviewed and is unchanged.  There is no change in the plan of care. We plan to proceed with the scheduled procedure.  [name], MD  03/22/2022, 12:02 PM

## 2022-03-22 NOTE — Plan of Care (Signed)
  Problem: Clinical Measurements: Goal: Cardiovascular complication will be avoided Outcome: Progressing   Problem: Activity: Goal: Risk for activity intolerance will decrease Outcome: Progressing   Problem: Nutrition: Goal: Adequate nutrition will be maintained Outcome: Progressing   Problem: Coping: Goal: Level of anxiety will decrease Outcome: Progressing   Problem: Elimination: Goal: Will not experience complications related to urinary retention Outcome: Progressing   Problem: Safety: Goal: Ability to remain free from injury will improve Outcome: Progressing

## 2022-03-22 NOTE — Progress Notes (Signed)
Mobility Specialist - Progress Note   03/22/22 1400  Mobility  Activity Ambulated independently in room;Stood at bedside;Dangled on edge of bed  Level of Assistance Standby assist, set-up cues, supervision of patient - no hands on  Assistive Device Front wheel walker  Distance Ambulated (ft) 40 ft  Activity Response Tolerated well  $Mobility charge 1 Mobility    Pt supine upon arrival using RA with family at bedside. Completes bed mobility ModI + extra time and STS indep. Ambulates in room ModI voicing leg pain and returns to bed with needs in reach and alarm set.  Merrily Brittle Mobility Specialist 03/22/22, 2:39 PM

## 2022-03-23 ENCOUNTER — Encounter
Admission: AD | Disposition: A | Discharge status: Home / Self Care | Payer: Self-pay | Source: Ambulatory Visit | Attending: Internal Medicine

## 2022-03-23 ENCOUNTER — Inpatient Hospital Stay: Payer: Medicare Other | Admitting: Anesthesiology

## 2022-03-23 ENCOUNTER — Encounter: Payer: Self-pay | Admitting: Internal Medicine

## 2022-03-23 DIAGNOSIS — I480 Paroxysmal atrial fibrillation: Secondary | ICD-10-CM

## 2022-03-23 DIAGNOSIS — Z6841 Body Mass Index (BMI) 40.0 and over, adult: Secondary | ICD-10-CM

## 2022-03-23 DIAGNOSIS — K625 Hemorrhage of anus and rectum: Secondary | ICD-10-CM | POA: Diagnosis not present

## 2022-03-23 DIAGNOSIS — I739 Peripheral vascular disease, unspecified: Secondary | ICD-10-CM

## 2022-03-23 HISTORY — PX: COLONOSCOPY WITH PROPOFOL: SHX5780

## 2022-03-23 SURGERY — COLONOSCOPY WITH PROPOFOL
Anesthesia: General

## 2022-03-23 MED ORDER — LIDOCAINE HCL (CARDIAC) PF 100 MG/5ML IV SOSY
PREFILLED_SYRINGE | INTRAVENOUS | Status: DC | PRN
  Administered 2022-03-23: 40 mg via INTRAVENOUS

## 2022-03-23 MED ORDER — ADULT MULTIVITAMIN W/MINERALS CH
1.0000 | ORAL_TABLET | Freq: Every day | ORAL | Status: DC
  Administered 2022-03-24: 1 via ORAL
  Filled 2022-03-23: qty 1

## 2022-03-23 MED ORDER — PROPOFOL 10 MG/ML IV BOLUS
INTRAVENOUS | Status: DC | PRN
  Administered 2022-03-23: 80 mg via INTRAVENOUS

## 2022-03-23 MED ORDER — ENSURE ENLIVE PO LIQD
237.0000 mL | Freq: Three times a day (TID) | ORAL | Status: DC
  Administered 2022-03-23: 237 mL via ORAL

## 2022-03-23 MED ORDER — PROPOFOL 500 MG/50ML IV EMUL
INTRAVENOUS | Status: DC | PRN
  Administered 2022-03-23: 150 ug/kg/min via INTRAVENOUS

## 2022-03-23 MED ORDER — PROPOFOL 500 MG/50ML IV EMUL
INTRAVENOUS | Status: AC
  Filled 2022-03-23: qty 50

## 2022-03-23 NOTE — Anesthesia Preprocedure Evaluation (Signed)
Anesthesia Evaluation  Patient identified by MRN, date of birth, ID band Patient awake    Reviewed: Allergy & Precautions, NPO status , Patient's Chart, lab work & pertinent test results  History of Anesthesia Complications Negative for: history of anesthetic complications  Airway Mallampati: III  TM Distance: <3 FB Neck ROM: full    Dental  (+) Poor Dentition, Missing, Chipped   Pulmonary shortness of breath and with exertion, COPD, former smoker,    Pulmonary exam normal        Cardiovascular Exercise Tolerance: Good hypertension, (-) angina+ CAD, + Peripheral Vascular Disease and +CHF  Normal cardiovascular exam     Neuro/Psych PSYCHIATRIC DISORDERS  Neuromuscular disease    GI/Hepatic Neg liver ROS, GERD  Controlled,  Endo/Other  negative endocrine ROS  Renal/GU Renal disease  negative genitourinary   Musculoskeletal   Abdominal   Peds  Hematology negative hematology ROS (+)   Anesthesia Other Findings Past Medical History: No date: Abdominal aortic atherosclerosis ([place])     Comment:  a. 05/2017 CTA abd/pelvis: significant atherosclerotic               dzs of the infrarenal abd Ao w/ some mural thrombus. No               aneurysm or dissection. No date: Acute focal ischemia of small intestine ([place]) 06/13/2017: Acute right-sided low back pain with right-sided sciatica 07/29/2017: AKI (acute kidney injury) ([place]) No date: Anginal pain ([place])     Comment:  a. 08/2012 Lexiscan MV: EF 54%, non ischemia/infarct. No date: Anxiety and depression No date: Baker's cyst of knee, right     Comment:  a. 07/2016 U/S: 4.1 x 1.4 x 2.9 cystic structure in R               poplitetal fossa. No date: Bell's palsy No date: Borderline diabetes No date: CHF (congestive heart failure) (HCC) No date: Chronic anticoagulation     Comment:  apixaban No date: Chronic idiopathic constipation No date: Chronic mesenteric ischemia  (HCC) No date: Chronic pain No date: COPD (chronic obstructive pulmonary disease) (HCC) No date: Diastolic dysfunction     Comment:  a. 05/2017 Echo: Ef 60-65%, no rwma, Gr1 DD, no source of              cardiac emboli. 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 05/2017 CTA Abd/pelvis: apparent thrombus or embolus               in prox SMA (70-90%); b. 05/2017 catheter directed tPA,               mechanical thrombectomy, and stenting of the SMA. 01/19/2016: Essential hypertension with goal blood pressure less than  140/90 01/19/2016: Gastroesophageal reflux disease No date: GERD (gastroesophageal reflux disease) No date: H/O colectomy 11/14/2019: History of 2019 novel coronavirus disease (COVID-19) No date: History of kidney stones No date: Hyperlipidemia No date: Hypertension No date: Morbid obesity with BMI of 40.0-44.9, adult ([place]) 06/19/2017: Occlusive mesenteric ischemia (HCC) No date: Osteoarthritis No date: PAD (peripheral artery disease) (HCC) No date: PAF (paroxysmal atrial fibrillation) (HCC) No date: Palpitations No date: Peripheral edema 06/01/2016: Personal history of other malignant neoplasm of skin No date: Pneumonia No date: Pre-diabetes 06/13/2017: Primary osteoarthritis of both knees 08/06/2017: SBO (small bowel obstruction) (HCC) No date: Skin cancer of nose No date: 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HCC) No date: Vitamin D deficiency  Past  Surgical History: 12/09/2019: AMPUTATION TOE; Left     Comment:  Procedure: AMPUTATION TOE MPJ left;  Surgeon: Caroline More, DPM;  Location: [facility];  Service: Podiatry;                Laterality: Left; No date: APPENDECTOMY 3/0/0762: APPLICATION OF WOUND VAC     Comment:  Procedure: APPLICATION OF WOUND VAC;  Surgeon: Algernon Huxley, MD;  Location: [facility];  Service: Vascular;; No date: CHOLECYSTECTOMY No date: COLON SURGERY 06/21/2020:  EMBOLECTOMY OR THROMBECTOMY, WITH OR WITHOUT CATHETER;  FEMOROPOPLITEAL, AORTOILIAC ARTERY, BY LEG INCISION; N/A     Comment:  Left - Decomp Fasciotomy Leg; Ant &/Or Lat Compart Only;              Midline - Angiography, Visceral, Selective Or               Supraselective (With Or Without Flush Aortogram); Left -               Revascularize, Endovasc, Open/Percut, Iliac Artery,               Unilat, Initial Vessel; W/Translum Stent, W/Angioplasty;               Right - Revascularize, Endovasc, Open/Percut, Iliac               Artery, Ea Add`L Ipsilateral; W/Translumin Stent,               W/Angioplasty; Location [facility]; 12/01/2021: ENDARTERECTOMY FEMORAL; Left     Comment:  Procedure: ENDARTERECTOMY FEMORAL;  Surgeon: Algernon Huxley, MD;  Location: [facility];  Service: Vascular;                Laterality: Left; 12/01/2021: ENDOVASCULAR REPAIR/STENT GRAFT; Left     Comment:  Procedure: ENDOVASCULAR REPAIR/STENT GRAFT;  Surgeon:               Algernon Huxley, MD;  Location: [place] [facility];                Service: Cardiovascular;  Laterality: Left; 08/08/2017: LAPAROTOMY; N/A     Comment:  Procedure: EXPLORATORY LAPAROTOMY POSSIBLE BOWEL               RESECTION;  Surgeon: Jules Husbands, MD;  Location: [facility]               [facility];  Service: General;  Laterality: N/A; 05/17/2021: LEFT HEART CATH AND CORONARY ANGIOGRAPHY; N/A     Comment:  Procedure: LEFT HEART CATH AND CORONARY ANGIOGRAPHY;                Surgeon: Yolonda Kida, MD;  Location: [place]              [facility];  Service: Cardiovascular;  Laterality: N/A; 11/17/2019: LOWER EXTREMITY ANGIOGRAPHY; Left     Comment:  Procedure: LOWER EXTREMITY ANGIOGRAPHY;  Surgeon: Algernon Huxley, MD;  Location: [place] [facility];  Service:               Cardiovascular;  Laterality: Left; 12/13/2020: LOWER EXTREMITY ANGIOGRAPHY; Right     Comment:  Procedure: LOWER  EXTREMITY ANGIOGRAPHY;  Surgeon: Algernon Huxley, MD;  Location: [place] [facility];  Service:               Cardiovascular;  Laterality: Right; 01/10/2021: LOWER EXTREMITY ANGIOGRAPHY; Right     Comment:  Procedure: LOWER EXTREMITY ANGIOGRAPHY;  Surgeon: Algernon Huxley, MD;  Location: [place] [facility];  Service:               Cardiovascular;  Laterality: Right; 01/24/2021: LOWER EXTREMITY ANGIOGRAPHY; Right     Comment:  Procedure: LOWER EXTREMITY ANGIOGRAPHY;  Surgeon: Algernon Huxley, MD;  Location: [place] [facility];  Service:               Cardiovascular;  Laterality: Right; 06/28/2021: LOWER EXTREMITY ANGIOGRAPHY; Bilateral     Comment:  Procedure: Lower Extremity Angiography;  Surgeon: Algernon Huxley, MD;  Location: [place] [facility];  Service:               Cardiovascular;  Laterality: Bilateral; 11/28/2021: LOWER EXTREMITY ANGIOGRAPHY; Right     Comment:  Procedure: LOWER EXTREMITY ANGIOGRAPHY;  Surgeon: Algernon Huxley, MD;  Location: [place] [facility];  Service:               Cardiovascular;  Laterality: Right; 11/30/2021: LOWER EXTREMITY ANGIOGRAPHY; Left     Comment:  Procedure: Lower Extremity Angiography;  Surgeon: Algernon Huxley, MD;  Location: [place] [facility];  Service:               Cardiovascular;  Laterality: Left; 02/22/2022: LOWER EXTREMITY ANGIOGRAPHY; Right     Comment:  Procedure: Lower Extremity Angiography;  Surgeon: Algernon Huxley, MD;  Location: [place] [facility];  Service:               Cardiovascular;  Laterality: Right; 03/13/2022: LOWER EXTREMITY ANGIOGRAPHY; Right     Comment:  Procedure: Lower Extremity Angiography;  Surgeon: Algernon Huxley, MD;  Location: [place] [facility];  Service:               Cardiovascular;  Laterality: Right; 11/19/2019: LOWER EXTREMITY INTERVENTION; N/A     Comment:  Procedure: LOWER EXTREMITY INTERVENTION;  Surgeon: Algernon Huxley, MD;  Location: [place] [facility];  Service:               Cardiovascular;  Laterality: N/A; 06/29/2021: LOWER EXTREMITY INTERVENTION; Bilateral     Comment:  Procedure: LOWER EXTREMITY INTERVENTION;  Surgeon: Algernon Huxley, MD;  Location: [place] [facility];  Service:               Cardiovascular;  Laterality: Bilateral; 06/22/2017:  TEE WITHOUT CARDIOVERSION; N/A     Comment:  Procedure: TRANSESOPHAGEAL ECHOCARDIOGRAM (TEE);                Surgeon: Wellington Hampshire, MD;  Location: [facility];                Service: Cardiovascular;  Laterality: N/A; 07/11/2018: TOTAL KNEE ARTHROPLASTY; Right     Comment:  Procedure: TOTAL KNEE ARTHROPLASTY;  Surgeon: Corky Mull, MD;  Location: [facility];  Service: Orthopedics;                Laterality: Right; No date: VAGINAL HYSTERECTOMY 06/20/2017: VISCERAL ARTERY INTERVENTION; N/A     Comment:  Procedure: Visceral Artery Intervention, possible aortic              thrombectomy;  Surgeon: Algernon Huxley, MD;  Location: [place] [facility];  Service: Cardiovascular;  Laterality:               N/A; 01/28/2018: VISCERAL ARTERY INTERVENTION; N/A     Comment:  Procedure: VISCERAL ARTERY INTERVENTION;  Surgeon: Algernon Huxley, MD;  Location: [place] [facility];  Service:               Cardiovascular;  Laterality: N/A;  BMI    Body Mass Index: 40.73 kg/m      Reproductive/Obstetrics negative OB ROS                             Anesthesia Physical Anesthesia Plan  ASA: 3  Anesthesia Plan: General   Post-op Pain Management:    Induction: Intravenous  PONV Risk Score and Plan: Propofol infusion and TIVA  Airway Management Planned: Natural Airway and Nasal Cannula  Additional Equipment:   Intra-op Plan:   Post-operative Plan:   Informed Consent: I have reviewed the patients History and Physical, chart, labs and discussed the procedure including the risks, benefits and  alternatives for the proposed anesthesia with the patient or authorized representative who has indicated his/her understanding and acceptance.     Dental Advisory Given  Plan Discussed with: Anesthesiologist, CRNA and Surgeon  Anesthesia Plan Comments: (Patient consented for risks of anesthesia including but not limited to:  - adverse reactions to medications - risk of airway placement if required - damage to eyes, teeth, lips or other oral mucosa - nerve damage due to positioning  - sore throat or hoarseness - Damage to heart, brain, nerves, lungs, other parts of body or loss of life  Patient voiced understanding.)        Anesthesia Quick Evaluation

## 2022-03-23 NOTE — Anesthesia Procedure Notes (Signed)
Date/Time: 03/23/2022 10:55 AM Performed by: Doreen Salvage, CRNA Pre-anesthesia Checklist: Patient identified, Emergency Drugs available, Suction available and Patient being monitored Patient Re-evaluated:Patient Re-evaluated prior to induction Oxygen Delivery Method: Nasal cannula Induction Type: IV induction Dental Injury: Teeth and Oropharynx as per pre-operative assessment  Comments: Nasal cannula with etCO2 monitoring

## 2022-03-23 NOTE — Anesthesia Postprocedure Evaluation (Signed)
Anesthesia Post Note  Patient: Carla [name]  Procedure(s) Performed: COLONOSCOPY WITH PROPOFOL  Patient location during evaluation: Endoscopy Anesthesia Type: General Level of consciousness: awake and alert Pain management: pain level controlled Vital Signs Assessment: post-procedure vital signs reviewed and stable Respiratory status: spontaneous breathing, nonlabored ventilation, respiratory function stable and patient connected to nasal cannula oxygen Cardiovascular status: blood pressure returned to baseline and stable Postop Assessment: no apparent nausea or vomiting Anesthetic complications: no   No notable events documented.   Last Vitals:  Vitals:   03/23/22 1122 03/23/22 1132  BP: 108/64 121/64  Pulse:  (!) 56  Resp: 18 14  Temp:    SpO2:      Last Pain:  Vitals:   03/23/22 1132  TempSrc:   PainSc: 0-No pain                 Precious Haws Laurene Melendrez

## 2022-03-23 NOTE — Progress Notes (Signed)
Mobility Specialist - Progress Note   03/23/22 1544  Mobility  Activity Ambulated independently in hallway;Stood at bedside;Dangled on edge of bed  Level of Assistance Modified independent, requires aide device or extra time  Assistive Device Front wheel walker  Distance Ambulated (ft) 80 ft  Activity Response Tolerated well  $Mobility charge 1 Mobility    Pt semi supine upon arrival using RA. Completes bed mobility indep and STS indep. Ambulates 41f ModI --- voices feel better this date and tolerates well. Pt is left supine with needs in reach.  MMerrily BrittleMobility Specialist 03/23/22, 3:46 PM

## 2022-03-23 NOTE — Transfer of Care (Signed)
Immediate Anesthesia Transfer of Care Note  Patient: Carla [name]  Procedure(s) Performed: Procedure(s) with comments: COLONOSCOPY WITH PROPOFOL (N/A) - rectal bleed  Patient Location: PACU and Endoscopy Unit  Anesthesia Type:General  Level of Consciousness: sedated  Airway & Oxygen Therapy: Patient Spontanous Breathing and Patient connected to nasal cannula oxygen  Post-op Assessment: Report given to RN and Post -op Vital signs reviewed and stable  Post vital signs: Reviewed and stable  Last Vitals:  Vitals:   03/23/22 1017 03/23/22 1107  BP: (!) 149/55 (!) 104/51  Pulse: (!) 58 (!) 58  Resp: 17 14  Temp: 37 C   SpO2: 72%     Complications: No apparent anesthesia complications

## 2022-03-23 NOTE — H&P (Signed)
Jonathon Bellows, MD [address] [place], [place], [place], [address] [id number], [place], [place], [place], [address] Phone: [phone]  Fax: [phone]  Primary Care Physician:  Kirk Ruths, MD   Pre-Procedure History & Physical: HPI:  Carla [name] is a 61 y.o. female is here for an colonoscopy.   Past Medical History:  Diagnosis Date   Abdominal aortic atherosclerosis ([place])    a. 05/2017 CTA abd/pelvis: significant atherosclerotic dzs of the infrarenal abd Ao w/ some mural thrombus. No aneurysm or dissection.   Acute focal ischemia of small intestine (HCC)    Acute right-sided low back pain with right-sided sciatica 06/13/2017   AKI (acute kidney injury) ([place]) 07/29/2017   Anginal pain ([place])    a. 08/2012 Lexiscan MV: EF 54%, non ischemia/infarct.   Anxiety and depression    Baker's cyst of knee, right    a. 07/2016 U/S: 4.1 x 1.4 x 2.9 cystic structure in R poplitetal fossa.   Bell's palsy    Borderline diabetes    CHF (congestive heart failure) (HCC)    Chronic anticoagulation    apixaban   Chronic idiopathic constipation    Chronic mesenteric ischemia (HCC)    Chronic pain    COPD (chronic obstructive pulmonary disease) (HCC)    Diastolic dysfunction    a. 05/2017 Echo: Ef 60-65%, no rwma, Gr1 DD, no source of cardiac emboli.   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 05/2017 CTA Abd/pelvis: apparent thrombus or embolus in prox SMA (70-90%); b. 05/2017 catheter directed tPA, mechanical thrombectomy, and stenting of the SMA.   Essential hypertension with goal blood pressure less than 140/90 01/19/2016   Gastroesophageal reflux disease 01/19/2016   GERD (gastroesophageal reflux disease)    H/O colectomy    History of 2019 novel coronavirus disease (COVID-19) 11/14/2019   History of kidney stones    Hyperlipidemia    Hypertension    Morbid obesity with BMI of 40.0-44.9, adult ([place])    Occlusive mesenteric ischemia ([place])  06/19/2017   Osteoarthritis    PAD (peripheral artery disease) (HCC)    PAF (paroxysmal atrial fibrillation) (HCC)    Palpitations    Peripheral edema    Personal history of other malignant neoplasm of skin 06/01/2016   Pneumonia    Pre-diabetes    Primary osteoarthritis of both knees 06/13/2017   SBO (small bowel obstruction) ([place]) 08/06/2017   Skin cancer of nose    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HCC)    Vitamin D deficiency     Past Surgical History:  Procedure Laterality Date   AMPUTATION TOE Left 12/09/2019   Procedure: AMPUTATION TOE MPJ left;  Surgeon: Caroline More, DPM;  Location: [facility];  Service: Podiatry;  Laterality: Left;   APPENDECTOMY     APPLICATION OF WOUND VAC  12/01/2021   Procedure: APPLICATION OF WOUND VAC;  Surgeon: Algernon Huxley, MD;  Location: [facility];  Service: Vascular;;   CHOLECYSTECTOMY     COLON SURGERY     EMBOLECTOMY OR THROMBECTOMY, WITH OR WITHOUT CATHETER; FEMOROPOPLITEAL, AORTOILIAC ARTERY, BY LEG INCISION N/A 06/21/2020   Left - Decomp Fasciotomy Leg; Ant &/Or Lat Compart Only; Midline - Angiography, Visceral, Selective Or Supraselective (With Or Without Flush Aortogram); Left - Revascularize, Endovasc, Open/Percut, Iliac Artery, Unilat, Initial Vessel; W/Translum Stent, W/Angioplasty; Right - Revascularize, Endovasc, Open/Percut, Iliac Artery, Ea Add`L Ipsilateral; W/Translumin Stent, W/Angioplasty; Location [facility];   ENDARTERECTOMY FEMORAL Left 12/01/2021  Procedure: ENDARTERECTOMY FEMORAL;  Surgeon: Algernon Huxley, MD;  Location: [facility];  Service: Vascular;  Laterality: Left;   ENDOVASCULAR REPAIR/STENT GRAFT Left 12/01/2021   Procedure: ENDOVASCULAR REPAIR/STENT GRAFT;  Surgeon: Algernon Huxley, MD;  Location: [facility];  Service: Cardiovascular;  Laterality: Left;   LAPAROTOMY N/A 08/08/2017   Procedure: EXPLORATORY LAPAROTOMY POSSIBLE BOWEL RESECTION;  Surgeon: Jules Husbands, MD;  Location: [facility];  Service: General;  Laterality:  N/A;   LEFT HEART CATH AND CORONARY ANGIOGRAPHY N/A 05/17/2021   Procedure: LEFT HEART CATH AND CORONARY ANGIOGRAPHY;  Surgeon: Yolonda Kida, MD;  Location: [facility];  Service: Cardiovascular;  Laterality: N/A;   LOWER EXTREMITY ANGIOGRAPHY Left 11/17/2019   Procedure: LOWER EXTREMITY ANGIOGRAPHY;  Surgeon: Algernon Huxley, MD;  Location: [place] [facility];  Service: Cardiovascular;  Laterality: Left;   LOWER EXTREMITY ANGIOGRAPHY Right 12/13/2020   Procedure: LOWER EXTREMITY ANGIOGRAPHY;  Surgeon: Algernon Huxley, MD;  Location: [facility];  Service: Cardiovascular;  Laterality: Right;   LOWER EXTREMITY ANGIOGRAPHY Right 01/10/2021   Procedure: LOWER EXTREMITY ANGIOGRAPHY;  Surgeon: Algernon Huxley, MD;  Location: [facility];  Service: Cardiovascular;  Laterality: Right;   LOWER EXTREMITY ANGIOGRAPHY Right 01/24/2021   Procedure: LOWER EXTREMITY ANGIOGRAPHY;  Surgeon: Algernon Huxley, MD;  Location: [facility];  Service: Cardiovascular;  Laterality: Right;   LOWER EXTREMITY ANGIOGRAPHY Bilateral 06/28/2021   Procedure: Lower Extremity Angiography;  Surgeon: Algernon Huxley, MD;  Location: [facility];  Service: Cardiovascular;  Laterality: Bilateral;   LOWER EXTREMITY ANGIOGRAPHY Right 11/28/2021   Procedure: LOWER EXTREMITY ANGIOGRAPHY;  Surgeon: Algernon Huxley, MD;  Location: [facility] [facility];  Service: Cardiovascular;  Laterality: Right;   LOWER EXTREMITY ANGIOGRAPHY Left 11/30/2021   Procedure: Lower Extremity Angiography;  Surgeon: Algernon Huxley, MD;  Location: [place];  Service: Cardiovascular;  Laterality: Left;   LOWER EXTREMITY ANGIOGRAPHY Right 02/22/2022   Procedure: Lower Extremity Angiography;  Surgeon: Algernon Huxley, MD;  Location: [place] [facility];  Service: Cardiovascular;  Laterality: Right;   LOWER EXTREMITY ANGIOGRAPHY Right 03/13/2022   Procedure: Lower Extremity Angiography;  Surgeon: Algernon Huxley, MD;  Location: [facility];  Service: Cardiovascular;  Laterality: Right;   LOWER EXTREMITY INTERVENTION N/A 11/19/2019   Procedure: LOWER EXTREMITY INTERVENTION;  Surgeon: Algernon Huxley, MD;  Location: [facility];  Service: Cardiovascular;  Laterality: N/A;   LOWER EXTREMITY INTERVENTION Bilateral 06/29/2021   Procedure: LOWER EXTREMITY INTERVENTION;  Surgeon: Algernon Huxley, MD;  Location: [facility];  Service: Cardiovascular;  Laterality: Bilateral;   TEE WITHOUT CARDIOVERSION N/A 06/22/2017   Procedure: TRANSESOPHAGEAL ECHOCARDIOGRAM (TEE);  Surgeon: Wellington Hampshire, MD;  Location: [facility];  Service: Cardiovascular;  Laterality: N/A;   TOTAL KNEE ARTHROPLASTY Right 07/11/2018   Procedure: TOTAL KNEE ARTHROPLASTY;  Surgeon: Corky Mull, MD;  Location: [facility];  Service: Orthopedics;  Laterality: Right;   VAGINAL HYSTERECTOMY     VISCERAL ARTERY INTERVENTION N/A 06/20/2017   Procedure: Visceral Artery Intervention, possible aortic thrombectomy;  Surgeon: Algernon Huxley, MD;  Location: [facility];  Service: Cardiovascular;  Laterality: N/A;   VISCERAL ARTERY INTERVENTION N/A 01/28/2018   Procedure: VISCERAL ARTERY INTERVENTION;  Surgeon: Algernon Huxley, MD;  Location: [facility];  Service: Cardiovascular;  Laterality: N/A;    Prior to Admission medications   Medication Sig Start  Date End Date Taking? Authorizing Provider  apixaban (ELIQUIS) 5 MG TABS tablet Take 1 tablet (5 mg total) by mouth 2 (two) times daily. 01/25/21  Yes Stegmayer, Janalyn Harder, PA-C  aspirin EC 81 MG tablet Take 81 mg by mouth daily. Swallow whole.   Yes [provider]  atorvastatin (LIPITOR) 80 MG tablet Take 80 mg by mouth at bedtime.   Yes [provider]  citalopram (CELEXA) 40 MG tablet Take 1 tablet (40 mg total) by mouth daily. 02/04/22 05/05/22 Yes Enzo Bi, MD  HYDROcodone-acetaminophen (NORCO/VICODIN) 5-325 MG tablet Take 1 tablet by mouth every 6 (six) hours as needed  for moderate pain. 03/07/22 03/07/23 Yes Kris Hartmann, NP  isosorbide mononitrate (IMDUR) 60 MG 24 hr tablet Take 60 mg by mouth daily. 01/23/22  Yes [provider]  metoprolol tartrate (LOPRESSOR) 50 MG tablet Take 1 tablet (50 mg total) by mouth 2 (two) times daily. 06/15/18  Yes Mody, Ulice Bold, MD  omeprazole (PRILOSEC) 20 MG capsule Take 20 mg by mouth 2 (two) times daily before a meal.   Yes [provider]  oxyCODONE-acetaminophen (PERCOCET) 5-325 MG tablet Take 1 tablet by mouth every 4 (four) hours as needed for severe pain. 03/16/22 03/16/23 Yes Blake Divine, MD  pregabalin (LYRICA) 100 MG capsule Take 1 capsule (100 mg total) by mouth 3 (three) times daily. 02/04/22 05/05/22 Yes Enzo Bi, MD  COMBIVENT RESPIMAT 20-100 MCG/ACT AERS respimat Inhale 1 puff into the lungs daily as needed for wheezing or shortness of breath. Patient not taking: Reported on 03/21/2022 04/06/21   [provider]  ferrous sulfate 325 (65 FE) MG tablet Take 325 mg by mouth daily.    [provider]  nitroGLYCERIN (NITROSTAT) 0.4 MG SL tablet Place 0.4 mg under the tongue as directed. 06/02/21 06/02/22  [provider]  traMADol (ULTRAM) 50 MG tablet Take 50 mg by mouth every 6 (six) hours as needed. 03/15/22   [provider]    Allergies as of 03/21/2022 - Review Complete 03/21/2022  Allergen Reaction Noted   Gabapentin  12/04/2019   Bactrim [sulfamethoxazole-trimethoprim] Itching 02/03/2019    Family History  Problem Relation Age of Onset   Hypertension Mother    Heart disease Mother    Heart attack Mother    Breast cancer Mother    Hypertension Father    Breast cancer Paternal Aunt     Social History   Socioeconomic History   Marital status: Significant Other    Spouse name: [name]    Number of children: Not on file   Years of education: Not on file   Highest education level: Not on file  Occupational History   Occupation: Unemployed   Tobacco Use   Smoking status: Former    Packs/day: 1.00    Types: Cigarettes   Smokeless tobacco: Never  Vaping Use   Vaping Use: Never used  Substance and Sexual Activity   Alcohol use: No   Drug use: No   Sexual activity: Not on file  Other Topics Concern   Not on file  Social History Narrative   Lives in [place] with boyfriend.  Does not routinely exercise.   Social Determinants of Health   Financial Resource Strain: Not on file  Food Insecurity: Not on file  Transportation Needs: Not on file  Physical Activity: Not on file  Stress: Not on file  Social Connections: Not on file  Intimate Partner Violence: Not on file    Review of Systems:  See HPI, otherwise negative ROS  Physical Exam: BP (!) 149/55   Pulse (!) 58   Temp 98.6 F (37 C) (Temporal)   Resp 17   Ht '5\' 2"'$  (1.575 m)   Wt 101 kg   SpO2 97%   BMI 40.73 kg/m  General:   Alert,  pleasant and cooperative in NAD Head:  Normocephalic and atraumatic. Neck:  Supple; no masses or thyromegaly. Lungs:  Clear throughout to auscultation, normal respiratory effort.    Heart:  +S1, +S2, Regular rate and rhythm, No edema. Abdomen:  Soft, nontender and nondistended. Normal bowel sounds, without guarding, and without rebound.   Neurologic:  Alert and  oriented x4;  grossly normal neurologically.  Impression/Plan: COY VANDOREN is here for an colonoscopy to be performed for rectal bleeding.  Risks, benefits, limitations, and alternatives regarding  colonoscopy have been reviewed with the patient.  Questions have been answered.  All parties agreeable.   Jonathon Bellows, MD  03/23/2022, 10:41 AM

## 2022-03-23 NOTE — Progress Notes (Signed)
  Progress Note   Patient: Carla [name] EYC:[id number] DOB: 04-12-1961 DOA: 03/21/2022     2 DOS: the patient was seen and examined on 03/23/2022   Brief hospital course:      SARAHLYNN CISNERO is a 61 y.o. female with medical history significant for peripheral arterial disease with right lower extremity pain at rest status post recent peripheral angiogram for reduced right ABI which showed occlusion of the posterior tibial artery in the mid to distal segment and the anterior tibial artery in the midsegment without distal reconstitution of these vessels, history of chronic diastolic dysfunction CHF, anxiety and depression, COPD, paroxysmal A-fib on chronic anticoagulation therapy, morbid obesity who presents to the ER from the vascular clinic for evaluation of rectal bleeding. Assessment and Plan: Rectal bleeding --Patient admitted to the hospital for evaluation of rectal bleeding --Patient is on apixaban and aspirin--on hold --on IV Protonix --Gastroenterology consult with Dr Vicente Males-- --colonoscopy showed poor prep and hemorrhoids likely cause for bleeding. G.I. signed off.  PAD (peripheral artery disease) ([place]) --Patient has severe peripheral arterial disease and presents for evaluation of rest pain in her right lower extremity. ---She is status post peripheral angiogram which shows occlusion of the posterior tibial artery in the mid to distal segment and anterior tibial artery in the midsegment without distal reconstitution of these vessels. --Patient has been seen by vascular surgery Dr Lucky Cowboy --prn pain control --Continue atorvastatin and metoprolol --Hold aspirin -- Dr. Lucky Cowboy plans to do lower extremity angiogram. NPO after midnight tonight. Patient agreeable  PAF (paroxysmal atrial fibrillation) ([place]) --Patient has a history of paroxysmal A-fib --Continue metoprolol for rate control --Hold apixaban for planned lower extremity angiogram tomorrow and resume thereafter.  Morbid obesity with BMI of  40.0-44.9, adult [facility]) Patient has a BMI of 14.97 kg/m2 Complicates overall prognosis and care  Chronic diastolic CHF (congestive heart failure) (HCC) Stable and not acutely exacerbated Continue metoprolol and nitrates  Recurrent major depressive disorder ([place]) Continue Celexa    patient continues to show improvement likely discharge tomorrow after angiogram    Subjective: no rectal bleed today. C/o chronic leg pain  Physical Exam: Vitals:   03/23/22 1111 03/23/22 1122 03/23/22 1132 03/23/22 1207  BP:  108/64 121/64 128/63  Pulse: (!) 55  (!) 56 (!) 55  Resp: '14 18 14 18  '$ Temp: (!) 96.4 F (35.8 C)   98.5 F (36.9 C)  TempSrc: Temporal     SpO2: 95%   95%  Weight:      Height:      Constitutional:      Appearance: She is obese.     Comments: Chronically ill-appearing.  Cardiovascular:     Rate and Rhythm: Normal rate and regular rhythm.  Pulmonary:     Effort: Pulmonary effort is normal.     Breath sounds: Normal breath sounds.  Abdominal:     General: Bowel sounds are normal.     Palpations: Abdomen is soft.     Tenderness: There is abdominal tenderness.     Comments: Ventral wall hernia in the right lower quadrant.  Right lower quadrant tenderness  Neurological:   Mental Status: She is alert.    Family Communication: family at bedside  Disposition: Status is: Inpatient Remains inpatient appropriate because: GI w/u  Planned Discharge Destination: Home    Time spent: 30 minutes  Author: Fritzi Mandes, MD 03/23/2022 3:16 PM  For on call review [email or web address].

## 2022-03-23 NOTE — Op Note (Signed)
[facility] Patient Name: Carla [name] Procedure Date: 03/23/2022 10:46 AM MRN: [id number] Account #: [id number] Date of Birth: 17-Oct-1961 Admit Type: Inpatient Age: 61 Room: [facility] ENDO ROOM 3 Gender: Female Note Status: Finalized Instrument Name: Jasper Riling [id number] Procedure:             Colonoscopy Indications:           Rectal bleeding Providers:             Jonathon Bellows MD, MD Referring MD:          No Local Md, MD (Referring MD) Medicines:             Monitored Anesthesia Care Complications:         No immediate complications. Procedure:             Pre-Anesthesia Assessment:                        - Prior to the procedure, a History and Physical was                         performed, and patient medications, allergies and                         sensitivities were reviewed. The patient's tolerance                         of previous anesthesia was reviewed.                        - The risks and benefits of the procedure and the                         sedation options and risks were discussed with the                         patient. All questions were answered and informed                         consent was obtained.                        - ASA Grade Assessment: III - A patient with severe                         systemic disease.                        - After reviewing the risks and benefits, the patient                         was deemed in satisfactory condition to undergo the                         procedure.                        After obtaining informed consent, the colonoscope was                         passed under direct vision. Throughout the procedure,  the patient's blood pressure, pulse, and oxygen                         saturations were monitored continuously. The                         Colonoscope was introduced through the anus and                         advanced to the the cecum, identified by  the                         appendiceal orifice, IC valve and transillumination.                         The colonoscopy was performed without difficulty. The                         patient tolerated the procedure well. The quality of                         the bowel preparation was poor. Findings:      The perianal and digital rectal examinations were normal.      Copious quantities of semi-liquid stool was found in the entire colon,       interfering with visualization.      Non-bleeding internal hemorrhoids were found during retroflexion. The       hemorrhoids were medium-sized and Grade I (internal hemorrhoids that do       not prolapse).      Yellow stool throughout the colon no evidence of bleeding . Prep       inadeqaute to assess for AVM's or small polyps Impression:            - Preparation of the colon was poor.                        - Stool in the entire examined colon.                        - Non-bleeding internal hemorrhoids.                        - No specimens collected. Recommendation:        - Return patient to hospital ward for ongoing care.                        - Advance diet as tolerated.                        - Continue present medications. Procedure Code(s):     --- Professional ---                        [phone], Colonoscopy, flexible; diagnostic, including                         collection of specimen(s) by brushing or washing, when                         performed (separate procedure) Diagnosis  Code(s):     --- Professional ---                        K64.0, First degree hemorrhoids                        K62.5, Hemorrhage of anus and rectum CPT copyright 2019 [employer] Association. All rights reserved. The codes documented in this report are preliminary and upon coder review may  be revised to meet current compliance requirements. Jonathon Bellows, MD Jonathon Bellows MD, MD 03/23/2022 11:05:18 AM This report has been signed electronically. Number of  Addenda: 0 Note Initiated On: 03/23/2022 10:46 AM Scope Withdrawal Time: 0 hours 4 minutes 8 seconds  Total Procedure Duration: 0 hours 8 minutes 12 seconds  Estimated Blood Loss:  Estimated blood loss: none.      [facility]

## 2022-03-24 ENCOUNTER — Encounter: Payer: Self-pay | Admitting: Gastroenterology

## 2022-03-24 ENCOUNTER — Encounter
Admission: AD | Disposition: A | Discharge status: Home / Self Care | Payer: Self-pay | Source: Ambulatory Visit | Attending: Internal Medicine

## 2022-03-24 DIAGNOSIS — I70221 Atherosclerosis of native arteries of extremities with rest pain, right leg: Secondary | ICD-10-CM

## 2022-03-24 DIAGNOSIS — I7409 Other arterial embolism and thrombosis of abdominal aorta: Secondary | ICD-10-CM

## 2022-03-24 DIAGNOSIS — I743 Embolism and thrombosis of arteries of the lower extremities: Secondary | ICD-10-CM | POA: Diagnosis not present

## 2022-03-24 DIAGNOSIS — I70202 Unspecified atherosclerosis of native arteries of extremities, left leg: Secondary | ICD-10-CM

## 2022-03-24 DIAGNOSIS — I998 Other disorder of circulatory system: Secondary | ICD-10-CM

## 2022-03-24 HISTORY — PX: LOWER EXTREMITY ANGIOGRAPHY: CATH118251

## 2022-03-24 LAB — CBC
HCT: 33.4 % — ABNORMAL LOW (ref 36.0–46.0)
Hemoglobin: 10.2 g/dL — ABNORMAL LOW (ref 12.0–15.0)
MCH: 25.7 pg — ABNORMAL LOW (ref 26.0–34.0)
MCHC: 30.5 g/dL (ref 30.0–36.0)
MCV: 84.1 fL (ref 80.0–100.0)
Platelets: 255 10*3/uL (ref 150–400)
RBC: 3.97 MIL/uL (ref 3.87–5.11)
RDW: 16.9 % — ABNORMAL HIGH (ref 11.5–15.5)
WBC: 5.3 10*3/uL (ref 4.0–10.5)
nRBC: 0 % (ref 0.0–0.2)

## 2022-03-24 LAB — BASIC METABOLIC PANEL
Anion gap: 4 — ABNORMAL LOW (ref 5–15)
BUN: 14 mg/dL (ref 8–23)
CO2: 27 mmol/L (ref 22–32)
Calcium: 8.8 mg/dL — ABNORMAL LOW (ref 8.9–10.3)
Chloride: 109 mmol/L (ref 98–111)
Creatinine, Ser: 0.88 mg/dL (ref 0.44–1.00)
GFR, Estimated: >60 mL/min (ref >60)
Glucose, Bld: 120 mg/dL — ABNORMAL HIGH (ref 70–99)
Potassium: 4.4 mmol/L (ref 3.5–5.1)
Sodium: 140 mmol/L (ref 135–145)

## 2022-03-24 LAB — PHOSPHORUS: Phosphorus: 3.7 mg/dL (ref 2.5–4.6)

## 2022-03-24 LAB — URINE CULTURE: Culture: >=100000 — AB

## 2022-03-24 LAB — MAGNESIUM: Magnesium: 2.1 mg/dL (ref 1.7–2.4)

## 2022-03-24 SURGERY — LOWER EXTREMITY ANGIOGRAPHY
Anesthesia: Moderate Sedation | Laterality: Right

## 2022-03-24 MED ORDER — ADULT MULTIVITAMIN W/MINERALS CH: 1.0000 | ORAL_TABLET | Freq: Every day | ORAL | 0 refills | Status: DC

## 2022-03-24 MED ORDER — APIXABAN 5 MG PO TABS
5.0000 mg | ORAL_TABLET | Freq: Two times a day (BID) | ORAL | Status: DC
  Administered 2022-03-24: 5 mg via ORAL
  Filled 2022-03-24: qty 1

## 2022-03-24 MED ORDER — IODIXANOL 320 MG/ML IV SOLN
INTRAVENOUS | Status: DC | PRN
  Administered 2022-03-24: 55 mL

## 2022-03-24 MED ORDER — DIPHENHYDRAMINE HCL 50 MG/ML IJ SOLN: 50.0000 mg | Freq: Once | INTRAMUSCULAR | Status: DC | PRN

## 2022-03-24 MED ORDER — CEPHALEXIN 500 MG PO CAPS: 500.0000 mg | ORAL_CAPSULE | Freq: Two times a day (BID) | ORAL | 0 refills | Status: AC

## 2022-03-24 MED ORDER — OXYCODONE-ACETAMINOPHEN 5-325 MG PO TABS
2.0000 | ORAL_TABLET | Freq: Once | ORAL | Status: AC
  Administered 2022-03-24: 2 via ORAL
  Filled 2022-03-24: qty 2

## 2022-03-24 MED ORDER — FENTANYL CITRATE (PF) 100 MCG/2ML IJ SOLN
INTRAMUSCULAR | Status: AC
  Filled 2022-03-24: qty 2

## 2022-03-24 MED ORDER — ONDANSETRON HCL 4 MG/2ML IJ SOLN
4.0000 mg | Freq: Four times a day (QID) | INTRAMUSCULAR | Status: DC | PRN
Start: 2022-03-24 — End: 2022-03-24

## 2022-03-24 MED ORDER — MIDAZOLAM HCL 2 MG/ML PO SYRP: 8.0000 mg | ORAL_SOLUTION | Freq: Once | ORAL | Status: DC | PRN

## 2022-03-24 MED ORDER — METHYLPREDNISOLONE SODIUM SUCC 125 MG IJ SOLR: 125.0000 mg | Freq: Once | INTRAMUSCULAR | Status: DC | PRN

## 2022-03-24 MED ORDER — MIDAZOLAM HCL 2 MG/2ML IJ SOLN
INTRAMUSCULAR | Status: AC
  Filled 2022-03-24: qty 2

## 2022-03-24 MED ORDER — SODIUM CHLORIDE 0.9 % IV SOLN
INTRAVENOUS | Status: DC
Start: 2022-03-24 — End: 2022-03-24

## 2022-03-24 MED ORDER — CEFAZOLIN SODIUM-DEXTROSE 2-4 GM/100ML-% IV SOLN
2.0000 g | INTRAVENOUS | Status: AC
  Administered 2022-03-24: 2 g via INTRAVENOUS

## 2022-03-24 MED ORDER — FENTANYL CITRATE (PF) 100 MCG/2ML IJ SOLN
INTRAMUSCULAR | Status: DC | PRN
Start: 2022-03-24 — End: 2022-03-24
  Administered 2022-03-24: 50 ug via INTRAVENOUS
  Administered 2022-03-24 (×2): 25 ug via INTRAVENOUS

## 2022-03-24 MED ORDER — PREGABALIN 100 MG PO CAPS: 100.0000 mg | ORAL_CAPSULE | Freq: Three times a day (TID) | ORAL | 0 refills | Status: DC

## 2022-03-24 MED ORDER — HYDROMORPHONE HCL 1 MG/ML IJ SOLN
1.0000 mg | Freq: Once | INTRAMUSCULAR | Status: AC | PRN
  Administered 2022-03-24: 1 mg via INTRAVENOUS

## 2022-03-24 MED ORDER — HYDROMORPHONE HCL 1 MG/ML IJ SOLN
INTRAMUSCULAR | Status: AC
  Filled 2022-03-24: qty 1

## 2022-03-24 MED ORDER — HEPARIN SODIUM (PORCINE) 1000 UNIT/ML IJ SOLN
INTRAMUSCULAR | Status: AC
  Filled 2022-03-24: qty 10

## 2022-03-24 MED ORDER — OXYCODONE-ACETAMINOPHEN 5-325 MG PO TABS: 1.0000 | ORAL_TABLET | Freq: Four times a day (QID) | ORAL | 0 refills | Status: DC | PRN

## 2022-03-24 MED ORDER — MIDAZOLAM HCL 2 MG/2ML IJ SOLN
INTRAMUSCULAR | Status: DC | PRN
  Administered 2022-03-24: 2 mg via INTRAVENOUS
  Administered 2022-03-24 (×2): 1 mg via INTRAVENOUS

## 2022-03-24 MED ORDER — HEPARIN SODIUM (PORCINE) 1000 UNIT/ML IJ SOLN
INTRAMUSCULAR | Status: DC | PRN
  Administered 2022-03-24: 5000 [IU] via INTRAVENOUS

## 2022-03-24 MED ORDER — FAMOTIDINE 20 MG PO TABS: 40.0000 mg | ORAL_TABLET | Freq: Once | ORAL | Status: DC | PRN

## 2022-03-24 SURGICAL SUPPLY — 20 items
BALLN LUTONIX AV 8X60X75 (BALLOONS) ×2
BALLN ULTRVRSE 7X60X75C (BALLOONS) ×2
BALLOON LUTONIX AV 8X60X75 (BALLOONS) IMPLANT
BALLOON ULTRVRSE 7X60X75C (BALLOONS) IMPLANT
CANISTER PENUMBRA ENGINE (MISCELLANEOUS) ×1 IMPLANT
CANNULA 5F STIFF (CANNULA) ×1 IMPLANT
CATH ANGIO 5F PIGTAIL 65CM (CATHETERS) ×1 IMPLANT
CATH INDIGO 7D KIT (CATHETERS) ×1 IMPLANT
COVER PROBE U/S 5X48 (MISCELLANEOUS) ×1 IMPLANT
DEVICE STARCLOSE SE CLOSURE (Vascular Products) ×2 IMPLANT
GLIDEWIRE ADV .035X180CM (WIRE) ×1 IMPLANT
GUIDEWIRE SUPER STIFF .035X180 (WIRE) ×1 IMPLANT
KIT ENCORE 26 ADVANTAGE (KITS) ×2 IMPLANT
PACK ANGIOGRAPHY (CUSTOM PROCEDURE TRAY) ×2 IMPLANT
SHEATH BRITE TIP 5FRX11 (SHEATH) ×1 IMPLANT
SHEATH BRITE TIP 7FRX11 (SHEATH) ×1 IMPLANT
STENT LIFESTREAM 9X58X80 (Permanent Stent) ×1 IMPLANT
SYR MEDRAD MARK 7 150ML (SYRINGE) ×1 IMPLANT
TUBING CONTRAST HIGH PRESS 72 (TUBING) ×1 IMPLANT
WIRE GUIDERIGHT .035X150 (WIRE) ×1 IMPLANT

## 2022-03-24 NOTE — Interval H&P Note (Signed)
History and Physical Interval Note:  03/24/2022 11:04 AM  Carla [name]  has presented today for surgery, with the diagnosis of Right Leg Pain.  The various methods of treatment have been discussed with the patient and family. After consideration of risks, benefits and other options for treatment, the patient has consented to  Procedure(s): Lower Extremity Angiography (Right) as a surgical intervention.  The patient's history has been reviewed, patient examined, no change in status, stable for surgery.  I have reviewed the patient's chart and labs.  Questions were answered to the patient's satisfaction.     [name]

## 2022-03-24 NOTE — Progress Notes (Signed)
Patient received from specials.  Oriented patient to room and call bell.  Instructions given for bed rest and lying flat , patient verbalize understanding.  Bilateral site CDI.

## 2022-03-24 NOTE — TOC Initial Note (Signed)
Transition of Care [facility]) - Initial/Assessment Note    Patient Details  Name: Carla [name] MRN: [id number] Date of Birth: 1960-12-03  [facility]) CM/SW Contact:    Magnus Ivan, LCSW Phone Number: 03/24/2022, 10:15 AM  Clinical Narrative:              Completed assessment with patient. Patient lives with her daughter who provides transportation. PCP is [facility]. Pharmacy is Paediatric nurse on [facility]. Patient has a RW she uses as needed. No HH or SNF history.  TOC to follow for needs.      Expected Discharge Plan: Home/Self Care Barriers to Discharge: Continued Medical Work up   Patient Goals and CMS Choice Patient states their goals for this hospitalization and ongoing recovery are:: to return home when medically ready CMS Medicare.gov Compare Post Acute Care list provided to:: Patient Choice offered to / list presented to : Patient  Expected Discharge Plan and Services Expected Discharge Plan: Home/Self Care       Living arrangements for the past 2 months: Single Family Home                                      Prior Living Arrangements/Services Living arrangements for the past 2 months: Single Family Home Lives with:: Adult Children Patient language and need for interpreter reviewed:: Yes Do you feel safe going back to the place where you live?: Yes      Need for Family Participation in Patient Care: Yes (Comment) Care giver support system in place?: Yes (comment) Current home services: DME Criminal Activity/Legal Involvement Pertinent to Current Situation/Hospitalization: No - Comment as needed  Activities of Daily Living Home Assistive Devices/Equipment: Built-in shower seat, Eyeglasses ADL Screening (condition at time of admission) Patient's cognitive ability adequate to safely complete daily activities?: Yes Is the patient deaf or have difficulty hearing?: No Does the patient have difficulty seeing, even when wearing  glasses/contacts?: Yes Does the patient have difficulty concentrating, remembering, or making decisions?: No Patient able to express need for assistance with ADLs?: Yes Does the patient have difficulty dressing or bathing?: No Independently performs ADLs?: Yes (appropriate for developmental age) Does the patient have difficulty walking or climbing stairs?: No Weakness of Legs: Left Weakness of Arms/Hands: Both  Permission Sought/Granted Permission sought to share information with : Facility Sport and exercise psychologist, Family Supports Permission granted to share information with : Yes, Verbal Permission Granted  Share Information with NAME: as needed           Emotional Assessment       Orientation: : Oriented to Self, Oriented to Place, Oriented to  Time, Oriented to Situation Alcohol / Substance Use: Not Applicable Psych Involvement: No (comment)  Admission diagnosis:  Rectal bleeding [K62.5] Patient Active Problem List   Diagnosis Date Noted   Rectal bleeding 03/21/2022   Hypokalemia 02/03/2022   Chronic anticoagulation    Anxiety and depression    Palpitations    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[place]) 12/03/2021   Critical limb ischemia of left lower extremity ([place]) 11/30/2021   CAD (coronary artery disease) 07/07/2021   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[place]) 06/27/2021   Ischemic pain of right foot 06/27/2021   Arterial occlusion    PAD (peripheral artery disease) ([place]) 05/03/2021   Ischemia of right lower extremity 01/24/2021   Tobacco abuse counseling 10/26/2020   Smoker 10/26/2020   Chronic diastolic CHF (congestive heart failure) (  [place]) 10/06/2020   Neuropathic pain 08/19/2020   Bursitis of right shoulder 12/01/2019   Chronic pain 12/01/2019   Primary osteoarthritis involving multiple joints 12/01/2019   Ischemia of left lower extremity 11/17/2019   Carpal tunnel syndrome, right 11/10/2019   Health care maintenance 03/26/2019   Disorder of skeletal system 03/26/2019   Problems  influencing health status 03/26/2019   Chronic low back pain (Secondary area of Pain) (Bilateral) w/o sciatica 03/26/2019   Rib pain on left side 03/25/2019   Aortic atherosclerosis ([place]) 09/12/2018   COPD (chronic obstructive pulmonary disease) ([place]) 09/12/2018   Moderate episode of recurrent major depressive disorder ([place]) 09/12/2018   Lumbar spondylosis 09/06/2018   History of total knee replacement (Right) 07/11/2018   Ventral hernia with obstruction and without gangrene 07/02/2018   PAF (paroxysmal atrial fibrillation) ([place]) 06/15/2018   Accelerated hypertension 04/08/2018   Acute encephalopathy 04/08/2018   DDD (degenerative disc disease), lumbosacral 09/25/2017   Osteoarthritis of knee 09/25/2017   Adjustment disorder with anxiety 08/15/2017   Chronic idiopathic constipation    Protein-calorie malnutrition, severe 08/08/2017   SBO (small bowel obstruction) ([place]) 08/06/2017   Chronic mesenteric ischemia (HCC)    AKI (acute kidney injury) ([place]) 07/29/2017   Hypotension 07/29/2017   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HCC)    Abdominal pain    Occlusive mesenteric ischemia ([place]) 06/19/2017   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HCC)    Acute focal ischemia of small intestine ([place])    Acute right-sided low back pain with right-sided sciatica 06/13/2017   Osteoarthritis of knee (Bilateral) 06/13/2017   Chronic pain syndrome 09/13/2016   Morbid obesity with BMI of 40.0-44.9, adult ([place]) 07/16/2016   Personal history of other malignant neoplasm of skin 06/01/2016   Chronic knee pain (Primary Area of Pain) (Bilateral) 01/19/2016   Essential hypertension with goal blood pressure less than 140/90 01/19/2016   Gastroesophageal reflux disease 01/19/2016   Hyperlipidemia 01/19/2016   Prediabetes 01/19/2016   Recurrent major depressive disorder ([place]) 01/19/2016   Urinary tract infection 01/09/2014   Chronic pelvic pain in female 11/14/2013   Mixed stress and urge urinary  incontinence 11/14/2013   Chest pain 08/06/2012   PCP:  Kirk Ruths, MD Pharmacy:   [facility] [address] [place] [address] [place]) [place] [address] Phone: [phone] Fax: [phone]     Social Determinants of Health (SDOH) Interventions    Readmission Risk Interventions    03/24/2022    9:51 AM 12/02/2021   11:23 AM  Readmission Risk Prevention Plan  Post Dischage Appt  Complete  Medication Screening  Complete  Transportation Screening Complete Complete  PCP or Specialist Appt within 3-5 Days Complete   HRI or [place] Complete   Social Work Consult for [place] Planning/Counseling Complete   Palliative Care Screening Not Applicable   Medication Review Press photographer) Complete

## 2022-03-24 NOTE — Progress Notes (Signed)
Discharge instructions given and reviewed with patient.  Ambulating independently.  Bilateral groin site soft and dressing without drainage.  Patient awaiting ride for discharge.

## 2022-03-24 NOTE — Care Management Important Message (Signed)
Important Message  Patient Details  Name: Carla [name] MRN: [id number] Date of Birth: May 18, 1961   Medicare Important Message Given:  Yes  Patient out of room upon time of visit.  Copy of Medicare IM left on beside tray for reference.   Dannette Barbara 03/24/2022, 11:00 AM

## 2022-03-24 NOTE — Discharge Summary (Signed)
Physician Discharge Summary   Patient: Carla [name] MRN: [id number] DOB: [birth year]  Admit date:     03/21/2022  Discharge date: 03/24/22  Discharge Physician: Fritzi Mandes   PCP: Kirk Ruths, MD   Recommendations at discharge:    F/u PCP in 1-2 weeks F/u dr Lucky Cowboy in 1-2 weeks  Discharge Diagnoses:   Hospital Course: No notes on file  Assessment and Plan: Rectal bleeding --Patient admitted to the hospital for evaluation of rectal bleeding --Patient is on apixaban and aspirin--on hold --on IV Protonix --Gastroenterology consult with Dr Vicente Males-- --colonoscopy showed poor prep and hemorrhoids likely cause for bleeding. G.I. signed off.  PAD (peripheral artery disease) ([place]) --Patient has severe peripheral arterial disease and presents for evaluation of rest pain in her right lower extremity. ---She is status post peripheral angiogram which shows occlusion of the posterior tibial artery in the mid to distal segment and anterior tibial artery in the midsegment without distal reconstitution of these vessels. --Patient has been seen by vascular surgery Dr Lucky Cowboy --prn pain control --Continue atorvastatin and metoprolol --s/p right LE angiogram with mechanical thrombectomy, angioplasty and stent placement (see op note) --resume eliquis. --pt will f/u Dr Lucky Cowboy as outpt  PAF (paroxysmal atrial fibrillation) [facility]) --Patient has a history of paroxysmal A-fib --Continue metoprolol for rate control --resumed apixaban post lower extremity angiogram   Morbid obesity with BMI of 40.0-44.9, adult [facility]) Patient has a BMI of 09.38 kg/m2 Complicates overall prognosis and care  Chronic diastolic CHF (congestive heart failure) ([place]) Stable and not acutely exacerbated Continue metoprolol and nitrates  Recurrent major depressive disorder (HCC) Continue Celexa   Overall at baseline. Discussed discharge later today. Pt agreeable. Pain meds and lyrica called in the [place] pharmacy       Consultants: vascular, GI Procedures performed: Coloscopy and right LE angiogram  Disposition: Home Diet recommendation:  Discharge Diet Orders (From admission, onward)     Start     Ordered   03/24/22 0000  Diet - low sodium heart healthy        03/24/22 1447           Cardiac diet DISCHARGE MEDICATION: Allergies as of 03/24/2022       Reactions   Gabapentin    Tremors    Bactrim [sulfamethoxazole-trimethoprim] Itching        Medication List     STOP taking these medications    aspirin EC 81 MG tablet   Combivent Respimat 20-100 MCG/ACT Aers respimat Generic drug: Ipratropium-Albuterol   HYDROcodone-acetaminophen 5-325 MG tablet Commonly known as: NORCO/VICODIN       TAKE these medications    apixaban 5 MG Tabs tablet Commonly known as: ELIQUIS Take 1 tablet (5 mg total) by mouth 2 (two) times daily.   atorvastatin 80 MG tablet Commonly known as: LIPITOR Take 80 mg by mouth at bedtime.   cephALEXin 500 MG capsule Commonly known as: KEFLEX Take 1 capsule (500 mg total) by mouth every 12 (twelve) hours for 3 days.   citalopram 40 MG tablet Commonly known as: CeleXA Take 1 tablet (40 mg total) by mouth daily.   ferrous sulfate 325 (65 FE) MG tablet Take 325 mg by mouth daily.   isosorbide mononitrate 60 MG 24 hr tablet Commonly known as: IMDUR Take 60 mg by mouth daily.   metoprolol tartrate 50 MG tablet Commonly known as: LOPRESSOR Take 1 tablet (50 mg total) by mouth 2 (two) times daily.   multivitamin with minerals Tabs tablet Take 1  tablet by mouth daily. Start taking on: March 25, 2022   nitroGLYCERIN 0.4 MG SL tablet Commonly known as: NITROSTAT Place 0.4 mg under the tongue as directed.   omeprazole 20 MG capsule Commonly known as: PRILOSEC Take 20 mg by mouth 2 (two) times daily before a meal.   oxyCODONE-acetaminophen 5-325 MG tablet Commonly known as: Percocet Take 1 tablet by mouth every 6 (six) hours as needed for  severe pain. What changed: when to take this   pregabalin 100 MG capsule Commonly known as: Lyrica Take 1 capsule (100 mg total) by mouth 3 (three) times daily.   traMADol 50 MG tablet Commonly known as: ULTRAM Take 50 mg by mouth every 6 (six) hours as needed.        Follow-up Information     Kirk Ruths, MD. Schedule an appointment as soon as possible for a visit in 1 week(s).   Specialty: Internal Medicine Contact information: [place] [address] [phone]         Algernon Huxley, MD Follow up in 2 week(s).   Specialties: Vascular Surgery, Radiology, Interventional Cardiology Why: PAD Contact information: [address] [place] [address] [phone]                Discharge Exam: Filed Weights   03/21/22 1700 03/22/22 0600 03/23/22 1017  Weight: 99.5 kg 101 kg 101 kg     Condition at discharge: fair  The results of significant diagnostics from this hospitalization (including imaging, microbiology, ancillary and laboratory) are listed below for reference.   Imaging Studies: CT ABDOMEN PELVIS WO CONTRAST  Result Date: 03/21/2022 CLINICAL DATA:  Rectal bleeding for 4 days, loose stools, right lower quadrant pain EXAM: CT ABDOMEN AND PELVIS WITHOUT CONTRAST TECHNIQUE: Multidetector CT imaging of the abdomen and pelvis was performed following the standard protocol without IV contrast. Unenhanced CT was performed per clinician order. Lack of IV contrast limits sensitivity and specificity, especially for evaluation of abdominal/pelvic solid viscera and vascular structures. RADIATION DOSE REDUCTION: This exam was performed according to the departmental dose-optimization program which includes automated exposure control, adjustment of the mA and/or kV according to patient size and/or use of iterative reconstruction technique. COMPARISON:  10/01/2020 FINDINGS: Lower chest: No acute pleural or parenchymal  lung disease. Hepatobiliary: Cholecystectomy. Unremarkable unenhanced appearance of the liver. Pancreas: Unremarkable unenhanced appearance. Spleen: Unremarkable unenhanced appearance. Adrenals/Urinary Tract: Chronic left adrenal thickening measuring up to 15 mm, with attenuation of -4 HU, consistent with adenoma or hyperplasia. No specific follow-up is required given stability and imaging features. Right adrenal is unremarkable. No urinary tract calculi or obstructive uropathy within either kidney. Bladder is grossly normal. Stomach/Bowel: No bowel obstruction or ileus. Large right lower quadrant ventral hernia is again identified, containing multiple loops of large and small bowel. No evidence of incarceration or bowel obstruction. No bowel wall thickening or inflammatory change. Vascular/Lymphatic: Stable aortic atherosclerosis. Stable SMA stent and distal aortic endoluminal stent graft. Evaluation of the vascular lumen is limited without IV contrast. No pathologic adenopathy. Reproductive: Status post hysterectomy. No adnexal masses. Other: No free fluid or free intraperitoneal gas. Large right lower quadrant ventral hernia as described above. Musculoskeletal: No acute or destructive bony lesions. Reconstructed images demonstrate no additional findings. IMPRESSION: 1. Large right lower quadrant ventral hernia unchanged. No evidence of incarceration or bowel obstruction. 2. No acute intra-abdominal or intrapelvic process. 3.  Aortic Atherosclerosis (ICD10-I70.0). Electronically Signed   By: Randa Ngo  M.D.   On: 03/21/2022 21:37   PERIPHERAL VASCULAR CATHETERIZATION  Result Date: 03/24/2022 See surgical note for result.  PERIPHERAL VASCULAR CATHETERIZATION  Result Date: 03/13/2022 See surgical note for result.  VAS [place] ABI WITH/WO TBI  Result Date: 03/09/2022  LOWER EXTREMITY DOPPLER STUDY Patient Name:  ADRYANNA FRIEDT  Date of Exam:   03/07/2022 Medical Rec #: [id number]      Accession #:     [id number] Date of Birth: Mar 20, 1961       Patient Gender: F Patient Age:   2 years Exam Location:  [facility] Procedure:      VAS [place] ABI WITH/WO TBI Referring Phys: Eulogio Ditch --------------------------------------------------------------------------------  Indications: LLE pain.  Vascular Interventions: 12/13/20: right cia,eia thrombectomy                         01/10/21: rt thrombectomy new bilat kissing stents in                         cia's, addtl rt eia stent;                         01/24/21: repeat thrombectomy right;                         06/29/21: Right CIA/EIA thrombectomy with bilateral CIA                         PTAs;                         11/28/21: Left CIA/EIA thrombectomy/PTA;                         12/01/2021: Left CFA,PFA and SFA Endartectomies and                         patch. Performing Technologist: Blondell Reveal RT, RDMS, RVT  Examination Guidelines: A complete evaluation includes at minimum, Doppler waveform signals and systolic blood pressure reading at the level of bilateral brachial, anterior tibial, and posterior tibial arteries, when vessel segments are accessible. Bilateral testing is considered an integral part of a complete examination. Photoelectric Plethysmograph (PPG) waveforms and toe systolic pressure readings are included as required and additional duplex testing as needed. Limited examinations for reoccurring indications may be performed as noted.  ABI Findings: +---------+------------------+-----+------------+--------+ Right    Rt Pressure (mmHg)IndexWaveform    Comment  +---------+------------------+-----+------------+--------+ Brachial 136                                         +---------+------------------+-----+------------+--------+ ATA      64                0.46 monophasic           +---------+------------------+-----+------------+--------+ PTA                             not detected          +---------+------------------+-----+------------+--------+ PERO     62                0.45 monophasic           +---------+------------------+-----+------------+--------+  Great Toe40                0.29 Abnormal             +---------+------------------+-----+------------+--------+ +---------+------------------+-----+----------+-------+ Left     Lt Pressure (mmHg)IndexWaveform  Comment +---------+------------------+-----+----------+-------+ Brachial 139                                      +---------+------------------+-----+----------+-------+ ATA      103               0.74 monophasic        +---------+------------------+-----+----------+-------+ PTA      67                0.48 monophasic        +---------+------------------+-----+----------+-------+ Great Toe58                0.42 Normal            +---------+------------------+-----+----------+-------+ +-------+-----------+-----------+------------+------------+ ABI/TBIToday's ABIToday's TBIPrevious ABIPrevious TBI +-------+-----------+-----------+------------+------------+ Right  0.46       0.29       0.63        0.61         +-------+-----------+-----------+------------+------------+ Left   0.74       0.42       0.75        0.75         +-------+-----------+-----------+------------+------------+ Right ABIs appear decreased compared to prior study on 02/15/22. Left ABIs appear essentially unchanged.  Summary: Right: Resting right ankle-brachial index indicates severe right lower extremity arterial disease. The right toe-brachial index is abnormal. Left: Resting left ankle-brachial index indicates moderate left lower extremity arterial disease. The left toe-brachial index is abnormal. *See table(s) above for measurements and observations.  Electronically signed by [name] MD on 03/09/2022 at 12:24:25 PM.    Final    VAS [place] LOWER EXTREMITY ARTERIAL DUPLEX  Result Date: 03/22/2022 LOWER EXTREMITY  ARTERIAL DUPLEX STUDY Patient Name:  ALEXES MENCHACA  Date of Exam:   03/21/2022 Medical Rec #: [id number]      Accession #:    [id number] Date of Birth: 05/12/1961       Patient Gender: F Patient Age:   60 years Exam Location:  [facility] Procedure:      VAS [place] LOWER EXTREMITY ARTERIAL DUPLEX Referring Phys: [name] DEW --------------------------------------------------------------------------------  Indications: LLE pain.  Vascular Interventions: 12/13/20: right cia,eia thrombectomy                         01/10/21: rt thrombectomy new bilat kissing stents in                         cia's, addtl rt eia stent;                         01/24/21: repeat thrombectomy right;                         06/29/21: Right CIA/EIA thrombectomy with bilateral CIA                         PTAs;  11/28/21: Left CIA/EIA thrombectomy/PTA;                         12/01/2021: Left CFA,PFA and SFA Endartectomies and                         patch.                          03/13/2022: Aortogram and Selective Bilateral                         Iliofemoral Arteriogram. PTA of the Left CIA with 7 mm                         diameter angioplasty balloon. Mechanical Thrombectomy                         using the penumbra CAT 7D catheter to the Right CIA and                         EIA. PTA of the Right CIA and EIA with 8 mm diameter                         angioplasty balloon. Current ABI:            Not obtained Performing Technologist: Almira Coaster RVS  Examination Guidelines: A complete evaluation includes B-mode imaging, spectral Doppler, color Doppler, and power Doppler as needed of all accessible portions of each vessel. Bilateral testing is considered an integral part of a complete examination. Limited examinations for reoccurring indications may be performed as noted.  +-----------+--------+-----+--------+----------+--------+ RIGHT      PSV cm/sRatioStenosisWaveform  Comments  +-----------+--------+-----+--------+----------+--------+ EIA Distal 54                   monophasic         +-----------+--------+-----+--------+----------+--------+ CFA Distal 40                   monophasic         +-----------+--------+-----+--------+----------+--------+ DFA        41                   monophasic         +-----------+--------+-----+--------+----------+--------+ SFA Prox   51                   monophasic         +-----------+--------+-----+--------+----------+--------+ SFA Mid    44                   monophasic         +-----------+--------+-----+--------+----------+--------+ SFA Distal 34                   monophasic         +-----------+--------+-----+--------+----------+--------+ POP Distal 25                   monophasic         +-----------+--------+-----+--------+----------+--------+ ATA Distal 9                    monophasic         +-----------+--------+-----+--------+----------+--------+ PTA Distal 13  monophasic         +-----------+--------+-----+--------+----------+--------+ PERO Distal25                   monophasic         +-----------+--------+-----+--------+----------+--------+  Summary: Right: Imaging and Waveforms obtained throughout in the Right Lower Extremity. Monophasic flow seen throughout in the Right Lower Extremity.  See table(s) above for measurements and observations. Electronically signed by [name] MD on 03/22/2022 at 9:20:14 AM.    Final     Microbiology: Results for orders placed or performed during the hospital encounter of 03/21/22  Urine Culture     Status: Abnormal   Collection Time: 03/22/22  3:20 PM   Specimen: Urine, Clean Catch  Result Value Ref Range Status   Specimen Description   Final    URINE, CLEAN CATCH Performed at [facility], [address], [place], [place] [address]    Special Requests   Final    NONE Performed at [facility], [address] [place], [place] [address]    Culture >=100,000 COLONIES/mL CITROBACTER FREUNDII (A)  Final   Report Status 03/24/2022 FINAL  Final   Organism ID, Bacteria CITROBACTER FREUNDII (A)  Final      Susceptibility   Citrobacter freundii - MIC*    CEFAZOLIN >=64 RESISTANT Resistant     CEFEPIME <=0.12 SENSITIVE Sensitive     CEFTRIAXONE <=0.25 SENSITIVE Sensitive     CIPROFLOXACIN <=0.25 SENSITIVE Sensitive     GENTAMICIN <=1 SENSITIVE Sensitive     IMIPENEM <=0.25 SENSITIVE Sensitive     NITROFURANTOIN <=16 SENSITIVE Sensitive     TRIMETH/SULFA <=20 SENSITIVE Sensitive     PIP/TAZO <=4 SENSITIVE Sensitive     * >=100,000 COLONIES/mL CITROBACTER FREUNDII    Labs: CBC: Recent Labs  Lab 03/21/22 1714 03/22/22 0525 03/24/22 0727  WBC 6.5 5.8 5.3  NEUTROABS 3.8  --   --   HGB 11.4* 10.6* 10.2*  HCT 36.5 34.5* 33.4*  MCV 82.0 83.1 84.1  PLT 315 274 675   Basic Metabolic Panel: Recent Labs  Lab 03/21/22 1714 03/24/22 0727  NA 138 140  K 3.2* 4.4  CL 101 109  CO2 23 27  GLUCOSE 140* 120*  BUN 11 14  CREATININE 0.95 0.88  CALCIUM 9.0 8.8*  MG 1.8 2.1  PHOS  --  3.7   Liver Function Tests: No results for input(s): AST, ALT, ALKPHOS, BILITOT, PROT, ALBUMIN in the last 168 hours. CBG: No results for input(s): GLUCAP in the last 168 hours.  Discharge time spent: greater than 30 minutes.  Signed: Fritzi Mandes, MD [facility] 03/24/2022

## 2022-03-24 NOTE — Op Note (Signed)
[facility]  Percutaneous Study/Intervention Procedural Note   Date of Surgery: 03/24/2022  Surgeon(s):Carla [name]    Assistants:none  Pre-operative Diagnosis: PAD with rest [name] RLE  Post-operative diagnosis:  Same  Procedure(s) Performed:             1.  Ultrasound guidance for vascular access bilateral femoral arteries             2.  Catheter placement into aorta from bilateral femoral approaches             3.  Aortogram and bilateral ileofemoral angiograms             4.  Percutaneous transluminal angioplasty of left common iliac artery with 7 mm diameter angioplasty balloon             5.  Mechanical thrombectomy to the right external and common iliac arteries as well as the distal aorta with the penumbra CAT 7D catheter  6.  Percutaneous transluminal angioplasty of the right common iliac artery with 8 mm diameter angioplasty balloon  7.  Lifestream stent placement to the right common iliac artery with 9 mm diameter by 58 mm length lifestream stent             8.  StarClose closure device bilateral femoral arteries  EBL: 200 cc  Contrast: 55 cc  Fluoro Time: 7.2 minutes  Moderate Conscious Sedation Time: approximately 55 minutes using 4 mg of Versed and 100 mcg of Fentanyl              Indications:  Patient is a 61 y.o.female with recurrent rest [name] of the right lower extremity despite multiple previous interventions. The patient has noninvasive study showing recurrent poor perfusion in the right leg. The patient is brought in for angiography for further evaluation and potential treatment.  Due to the limb threatening nature of the situation, angiogram was performed for attempted limb salvage. The patient is aware that if the procedure fails, amputation would be expected.  The patient also understands that even with successful revascularization, amputation may still be required due to the severity of the situation.  Risks and benefits are discussed and  informed consent is obtained.   Procedure:  The patient was identified and appropriate procedural time out was performed.  The patient was then placed supine on the table and prepped and draped in the usual sterile fashion. Moderate conscious sedation was administered during a face to face encounter with the patient throughout the procedure with my supervision of the RN administering medicines and monitoring the patient's vital signs, pulse oximetry, telemetry and mental status throughout from the start of the procedure until the patient was taken to the recovery room. Ultrasound was used to evaluate the left common femoral artery.  It was patent .  A digital ultrasound image was acquired.  A Seldinger needle was used to access the left common femoral artery under direct ultrasound guidance and a permanent image was performed.  A 0.035 J wire was advanced without resistance and a 5Fr sheath was placed.  Pigtail catheter was placed into the aorta and an AP aortogram was performed. This demonstrated normal renal arteries and the aortoiliac stent graft with occlusion of the right common iliac artery with reconstitution through collaterals in the right common femoral artery.  The left iliac system appear to be patent but there was thrombus up into the distal aorta.  Known from previous interventions that it was almost impossible to cross from left  to right, I elected to go ahead and access the right femoral artery.  The right femoral artery was accessed under direct ultrasound guidance without difficulty with a micropuncture needle.  A micropuncture wire and sheath were placed and a permanent image was recorded.  I then upsized to a 7 [place] sheath on the right and heparinized the patient.  I then put a 7 mm diameter by 6 cm length angioplasty balloon up in the left common iliac artery to protect this.  I then used the penumbra CAT 7D catheter and for mechanical thrombectomy on the right side and the right external  iliac artery, common iliac artery, and the distal aorta.  Chunks of thrombus were removed, but there remained occlusion on the right side.  I then performed balloon angioplasty of the right common and external iliac artery with 8 mm diameter angioplasty balloon and I inflated both the 7 and the 8 mm diameter angioplasty balloons up into the aortic portion of the stent simultaneously to treat the aorta as well.  Despite this, there remained occlusion of the right common iliac artery.  I then reinflated the balloon in the left common iliac artery and selected a 9 mm diameter by 58 mm length lifestream stent on the right and deployed this up just into the distal aortic portion of the stent graft taking this up to 12 atm.  Following this, there still remained minimal flow in the right iliac system.  A 9 mm balloon was then pulled down to the more distal common iliac artery and external iliac artery inflated to 8 atm.  Flow remains sluggish but at this point I did not see another option for revascularization and this was also an intervention that had occluded and less than 24 hours after a beautiful looking completion result only a week ago.  The patient will likely require right above-knee amputation at this point as our surgical options are extraordinarily limited given her anatomy, hypercoagulable state, large ventral hernia, and multiple previous femoral operations.  I elected to terminate the procedure. The sheath was removed and StarClose closure device was deployed in the right femoral artery with excellent hemostatic result.  Similarly, StarClose closure device was deployed in the left femoral artery with excellent hemostatic result.  The patient was taken to the recovery room in stable condition having tolerated the procedure well.  Findings:               Aortogram: Thrombus in the distal aorta but the renal arteries appeared patent and the more proximal portion of the aorta was patent.  Left iliac system  was patent.  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                Disposition: Patient was taken to the recovery room in stable condition having tolerated the procedure well.  Complications: None  Carla [name] 03/24/2022 1:22 PM   This note was created with Dragon Medical transcription system. Any errors in dictation are purely unintentional.

## 2022-03-24 NOTE — Progress Notes (Signed)
Mobility Specialist - Progress Note    03/24/22 1300  Mobility  Activity Ambulated independently in hallway;Stood at bedside;Dangled on edge of bed  Level of Assistance Independent  Assistive Device Front wheel walker  Distance Ambulated (ft) 180 ft  Activity Response Tolerated well  $Mobility charge 1 Mobility    Pt long sitting upon arrival using RA. Completes all activities indep and ambulates indep + extra time. Pt returns to bed with needs in reach.   Merrily Brittle Mobility Specialist 03/24/22, 1:07 PM

## 2022-03-24 NOTE — Plan of Care (Signed)
  Problem: Clinical Measurements: Goal: Ability to maintain clinical measurements within normal limits will improve Outcome: Progressing Goal: Will remain free from infection Outcome: Progressing Goal: Diagnostic test results will improve Outcome: Progressing Goal: Respiratory complications will improve Outcome: Progressing Goal: Cardiovascular complication will be avoided Outcome: Progressing   Problem: Pain Managment: Goal: General experience of comfort will improve Outcome: Progressing   Pt is involved in and agrees with the plan of care. V/S stable. No rectal bleeding noted. Reports pain on her R leg; morphine IV given with relief. Pt is scheduled for angiogram today.

## 2022-03-28 ENCOUNTER — Telehealth (INDEPENDENT_AMBULATORY_CARE_PROVIDER_SITE_OTHER): Payer: Self-pay

## 2022-03-28 ENCOUNTER — Other Ambulatory Visit (INDEPENDENT_AMBULATORY_CARE_PROVIDER_SITE_OTHER): Payer: Self-pay | Admitting: Vascular Surgery

## 2022-03-28 NOTE — Telephone Encounter (Signed)
Patient left a message stating that she is still experiencing pain with her right leg.I have call in tramadol '50mg'$  #30 with 1 refill into [facility]. Patient informed that she will contact the office if the pain meds does not help and if she want  to move forward with surgery.

## 2022-03-30 ENCOUNTER — Other Ambulatory Visit (INDEPENDENT_AMBULATORY_CARE_PROVIDER_SITE_OTHER): Payer: Self-pay | Admitting: Vascular Surgery

## 2022-03-30 DIAGNOSIS — I70221 Atherosclerosis of native arteries of extremities with rest pain, right leg: Secondary | ICD-10-CM

## 2022-03-30 DIAGNOSIS — Z95820 Peripheral vascular angioplasty status with implants and grafts: Secondary | ICD-10-CM

## 2022-03-31 ENCOUNTER — Other Ambulatory Visit (INDEPENDENT_AMBULATORY_CARE_PROVIDER_SITE_OTHER): Payer: Self-pay | Admitting: Nurse Practitioner

## 2022-03-31 MED ORDER — OXYCODONE-ACETAMINOPHEN 5-325 MG PO TABS: 1.0000 | ORAL_TABLET | Freq: Four times a day (QID) | ORAL | 0 refills | Status: DC | PRN

## 2022-03-31 NOTE — Telephone Encounter (Signed)
Patient made aware medication has been sent

## 2022-03-31 NOTE — Telephone Encounter (Signed)
Percocet sent

## 2022-04-03 ENCOUNTER — Telehealth (INDEPENDENT_AMBULATORY_CARE_PROVIDER_SITE_OTHER): Payer: Self-pay

## 2022-04-03 NOTE — Telephone Encounter (Signed)
Pharmacy reach wanted to make the provider aware that the patient has been receiving pain medicaption from different providers. The patient received Oxycodone 5-'325mg'$  #20 on 6/2,tramadol '50mg'$  #30 6/6,Oxycodone 5-'325mg'$  #20 on 6/6.[facility] wanted to verify if they filled prescription that was sent from our office on 03/31/22. Provider was made aware with information.

## 2022-04-04 ENCOUNTER — Telehealth (INDEPENDENT_AMBULATORY_CARE_PROVIDER_SITE_OTHER): Payer: Self-pay

## 2022-04-04 NOTE — Telephone Encounter (Signed)
Spoke with the patient and she is scheduled with Dr. Lucky Cowboy for a right AKA on 04/06/22 at the [facility]. Pre-op phone call on 04/05/22 between 8-1 pm. Pre-surgical instructions were discussed and patient stated she understood.

## 2022-04-05 ENCOUNTER — Ambulatory Visit (INDEPENDENT_AMBULATORY_CARE_PROVIDER_SITE_OTHER): Payer: Medicare Other | Admitting: Nurse Practitioner

## 2022-04-05 ENCOUNTER — Encounter: Payer: Self-pay | Admitting: Vascular Surgery

## 2022-04-05 ENCOUNTER — Other Ambulatory Visit: Payer: Self-pay

## 2022-04-05 ENCOUNTER — Encounter (INDEPENDENT_AMBULATORY_CARE_PROVIDER_SITE_OTHER): Payer: Medicare Other

## 2022-04-05 ENCOUNTER — Other Ambulatory Visit (INDEPENDENT_AMBULATORY_CARE_PROVIDER_SITE_OTHER): Payer: Self-pay | Admitting: Nurse Practitioner

## 2022-04-05 ENCOUNTER — Encounter
Admission: RE | Admit: 2022-04-05 | Discharge: 2022-04-05 | Disposition: A | Discharge status: Home / Self Care | Payer: Medicare Other | Source: Ambulatory Visit | Attending: Vascular Surgery | Admitting: Vascular Surgery

## 2022-04-05 DIAGNOSIS — Z01812 Encounter for preprocedural laboratory examination: Secondary | ICD-10-CM

## 2022-04-05 DIAGNOSIS — M79671 Pain in right foot: Secondary | ICD-10-CM

## 2022-04-05 HISTORY — DX: Anemia, unspecified: D64.9

## 2022-04-05 NOTE — Patient Instructions (Addendum)
Your procedure is scheduled on: 04/06/22 - Thursday Report to the Registration Desk on the 1st floor of the [facility]. To find out your arrival time, please call [phone] between 1PM - 3PM on: 04/05/22 - Wednesday If your arrival time is 6:00 am, do not arrive prior to that time as the [facility] entrance doors do not open until 6:00 am.  REMEMBER: Instructions that are not followed completely may result in serious medical risk, up to and including death; or upon the discretion of your surgeon and anesthesiologist your surgery may need to be rescheduled.  Do not eat food after midnight the night before surgery.  No gum chewing, lozengers or hard candies.  You may however, drink CLEAR liquids up to 2 hours before you are scheduled to arrive for your surgery. Do not drink anything within 2 hours of your scheduled arrival time.  Clear liquids include: - water  - apple juice without pulp - gatorade (not RED colors) - black coffee or tea (Do NOT add milk or creamers to the coffee or tea) Do NOT drink anything that is not on this list.  TAKE THESE MEDICATIONS THE MORNING OF SURGERY WITH A SIP OF WATER:  - citalopram (CELEXA) 40 MG tablet - isosorbide mononitrate (IMDUR) 60 MG 24 hr tablet - metoprolol tartrate (LOPRESSOR) 50 MG tablet - omeprazole (PRILOSEC) 20 MG capsule, (take one the night before and one on the morning of surgery - helps to prevent nausea after surgery.) - pregabalin (LYRICA) 100 MG capsule  Follow recommendations from Cardiologist, Pulmonologist or PCP regarding stopping Aspirin, Coumadin, Plavix, Eliquis, Pradaxa, or Pletal. Hold Eliquis beginning today, 04/05/22, may continue Aspirin 81 mg.   One week prior to surgery: Stop Anti-inflammatories (NSAIDS) such as Advil, Aleve, Ibuprofen, Motrin, Naproxen, Naprosyn and Aspirin based products such as Excedrin, Goodys Powder, BC Powder.  Stop ANY OVER THE COUNTER supplements until after surgery.  You may take  Tylenol if needed for pain up until the day of surgery.  No Alcohol for 24 hours before or after surgery.  No Smoking including e-cigarettes for 24 hours prior to surgery.  No chewable tobacco products for at least 6 hours prior to surgery.  No nicotine patches on the day of surgery.  Do not use any "recreational" drugs for at least a week prior to your surgery.  Please be advised that the combination of cocaine and anesthesia may have negative outcomes, up to and including death. If you test positive for cocaine, your surgery will be cancelled.  On the morning of surgery brush your teeth with toothpaste and water, you may rinse your mouth with mouthwash if you wish. Do not swallow any toothpaste or mouthwash.  Do not wear jewelry, make-up, hairpins, clips or nail polish.  Do not wear lotions, powders, or perfumes.   Do not shave body from the neck down 48 hours prior to surgery just in case you cut yourself which could leave a site for infection.  Also, freshly shaved skin may become irritated if using the CHG soap.  Contact lenses, hearing aids and dentures may not be worn into surgery.  Do not bring valuables to the hospital. [facility] is not responsible for any missing/lost belongings or valuables.   Notify your doctor if there is any change in your medical condition (cold, fever, infection).  Wear comfortable clothing (specific to your surgery type) to the hospital.  After surgery, you can help prevent lung complications by doing breathing exercises.  Take deep  breaths and cough every 1-2 hours. Your doctor may order a device called an Incentive Spirometer to help you take deep breaths. When coughing or sneezing, hold a pillow firmly against your incision with both hands. This is called "splinting." Doing this helps protect your incision. It also decreases belly discomfort.  If you are being admitted to the hospital overnight, leave your suitcase in the car. After surgery it  may be brought to your room.  If you are being discharged the day of surgery, you will not be allowed to drive home. You will need a responsible adult (18 years or older) to drive you home and stay with you that night.   If you are taking public transportation, you will need to have a responsible adult (18 years or older) with you. Please confirm with your physician that it is acceptable to use public transportation.   Please call the [facility] at [phone] if you have any questions about these instructions.  Surgery Visitation Policy:  Patients undergoing a surgery or procedure may have two family members or support persons with them as long as the person is not COVID-19 positive or experiencing its symptoms.   Inpatient Visitation:    Visiting hours are 7 a.m. to 8 p.m. Up to four visitors are allowed at one time in a patient room, including children. The visitors may rotate out with other people during the day. One designated support person (adult) may remain overnight.

## 2022-04-05 NOTE — Progress Notes (Unsigned)
Cardiology clearance note   Dr Lucky Cowboy  [name] was seen at my office less than a month ago on Mar 08, 2022 after evaluation we have deemed her to be mild to moderate risk for amputation.  No further intervention or studies are necessary at this stage I do recommend having the patient hold her Eliquis prior to the procedure minimum of 18 hours or up to 5 days.  She is okay to resume Eliquis 2 days after the procedure with no complications.  [facility] 04/05/22

## 2022-04-05 NOTE — Progress Notes (Signed)
Perioperative Services  Pre-Admission/Anesthesia Testing Clinical Review  Date: 04/05/22  Patient Demographics:  Name: Carla [name] DOB:   July 15, 1961 MRN:   [id number]  Planned Surgical Procedure(s):    Case: [id number] Date/Time: 04/06/22 1428   Procedure: AMPUTATION ABOVE KNEE (Right: Knee)   Anesthesia type: General   Pre-op diagnosis: ASO WITH REST PAIN   Location: [facility]   Surgeons: Algernon Huxley, MD   NOTE: Available PAT nursing documentation and vital signs have been reviewed. Clinical nursing staff has updated patient's PMH/PSHx, current medication list, and drug allergies/intolerances to ensure comprehensive history available to assist in medical decision making as it pertains to the aforementioned surgical procedure and anticipated anesthetic course. Extensive review of available clinical information performed. [name] PMH and PSHx updated with any diagnoses/procedures that  may have been inadvertently omitted during her intake with the pre-admission testing department's nursing staff.  Clinical Discussion:  [name] is a 61 y.o. female who is submitted for pre-surgical anesthesia review and clearance prior to her undergoing the above procedure. Patient is a Current Smoker. [name] includes: CAD, NSTEMI, angina, CHF, PAF, PAD, aortic atherosclerosis, palpitations, SMA thrombosis/embolus, chronic mesenteric ischemia, HTN, HLD, prediabetes, COPD, DOE, GERD (on daily PPI), anemia, peripheral edema, OA, chronic pain syndrome, anxiety, depression.  Patient is followed by cardiology Clayborn Bigness, MD). She was last seen in the cardiology clinic on 03/08/2022; notes reviewed.  At the time of her clinic visit, patient denied any complaints on chest pain, however did complain of pain in her LEFT shoulder.  Patient with chronic dyspnea, dizziness, and BILATERAL ankle edema that were all reported to be stable and at baseline.  She denied any PND,  orthopnea, palpitations, or presyncope/syncope.  Patient with a past medical history significant for cardiovascular diagnoses.  TTE performed on 06/20/2017 revealed normal left ventricular systolic function with an EF of 60 to 65%.  There was mild concentric LVH.  Doppler parameters consistent with abnormal relaxation (G1DD).  Atria normal in size.  PASP within normal range.  Subsequent studies performed on 04/08/2018, 06-19-2018, 07/29/2019, and 06/22/2020 have remained grossly unchanged.  Myocardial perfusion imaging study performed on 06/28/2018 revealing no evidence of stress-induced myocardial ischemia or arrhythmia.  LVEF approximately 60%.  Repeat study performed on 07/29/2019 remain grossly unchanged.  Patient with significant peripheral vascular disease requiring intervention by vascular surgery. She has undergone multiple mechanical thrombectomies and percutaneous transluminal angioplasty procedures dating back to 2018.  She presented to [facility] on 06/21/2020 with lower limb ischemia.  CTA imaging revealed BILATERAL lower extremity limb ischemia secondary to acute aortoiliac thrombosis and three-vessel runoff occlusion in the LEFT lower extremity.  On 06/21/2020, patient underwent stenting of her BILATERAL common iliac arteries and RIGHT external iliac artery.  Embolectomy of the RIGHT deep femoral artery and BILATERAL common iliac arteries performed.  Patient also underwent a fasciotomy of the LEFT lower extremity with LEFT PT and DP cutdown and RIGHT groin cutdown incisions.  Myocardial perfusion imaging study performed on 10/05/2020 revealed a severely decreased left ventricular systolic function with an EF of 25-30%.  There was global hypokinesis with evidence of a significant defect in the inferior region with no clear evidence of redistribution.  Left ventricular cavity mildly enlarged.  Diagnostic left heart catheterization performed on 05/17/2021 revealing normal left ventricular  systolic function with an EF of at least 55%.  CAD noted; 60-70% stenosis of the proximal RCA and 50% stenosis of the first  diagonal.  LM was free of disease.  LAD noted be large with minor irregularities.  LVEDP normal.  Decision was made to defer intervention and pursue medical therapy.  Patient suffered an NSTEMI on 06/27/2021. High sensitivity troponins were trended: 893 --> 1587 --> 1748 --> 868 --> 735 ng/L. Patient also with RIGHT lower limb ischemia. Cardiology was consulted. With medical management, the patient stabilized and experienced no further angina. Patient cleared for vascular procedure. She underwent mechanical thrombectomy of the RIGHT common and external iliac artery.  Additionally, she underwent percutaneous transluminal angioplasty of the BILATERAL common iliac arteries up to the distal aorta.  Patient with a paroxysmal atrial fibrillation diagnosis. CHA2DS2-VASc Score = 6 (sex, CHF, HTN, DVT x 2, aortic plaque).  She is on chronic anticoagulation using daily apixaban and low-dose ASA; compliant with therapy with no evidence of GI bleeding.  Rate and rhythm maintained with beta-blocker monotherapy.  Blood pressure reasonably controlled at 148/82 on prescribed nitrate, diuretic, and beta-blocker therapies. In addition to scheduled nitrates, patient has a supply of SL NTG to use on a as needed basis. She is on a statin for her HLD diagnosis and further ASCVD prevention. Patient with a prediabetes diagnosis; last HgbA1c was 6.2% when checked on 04/20/2021. Functional capacity limited by multiple medical comorbidities; unable to achieve 4 METS of activity. Chronic dyspnea felt to be related to her underlying CHF, COPD, and continued tobacco use.  Patient reported to be chronically ill appearing; no acute distress while in clinic.  No changes were made to her medication regimen.  Patient to follow-up with outpatient cardiology in 4 months or sooner if needed.  Patient is scheduled to undergo a  RIGHT ABOVE THE KNEE AMPUTATION on 04/06/2022 with Dr. [name], MD. Given patient's past medical history significant for cardiovascular disease and interventions, presurgical cardiac clearance was sought by the performing surgeon's office and PAT team. Per cardiology, "this patient is optimized for surgery and may proceed with the planned procedural course with a LOW risk of significant perioperative cardiovascular complications". Again, this patient is on daily anticoagulation therapy.  Due to the limited time prior to her procedure, there is not sufficient time to allow for typical washout period. This has been discussed with vascular surgery. Patient to hold apixaban starting today. She will continue her low dose ASA throughout the perioperative course. Plans will be for her to resume anticoagulation therapy as soon as postoperative bleeding risk felt to be minimized by her primary attending surgeon.   Patient denies previous perioperative complications with anesthesia in the past. In review of the available records, it is noted that patient underwent a general anesthetic course here at [facility] ([facility]) in 11/2021 without documented complications.      03/24/2022    7:10 PM 03/24/2022    2:50 PM 03/24/2022    2:11 PM  Vitals with BMI  Systolic [phone]  Diastolic 81 65 63  Pulse 825 55 53    Providers/Specialists:   NOTE: Primary physician provider listed below. Patient may have been seen by APP or partner within same practice.   PROVIDER ROLE / SPECIALTY LAST Imelda Pillow, MD General Surgery 03/24/2022  Kirk Ruths, MD Primary Care Provider 09/29/2021  Katrine Coho, MD Cardiology 03/08/2022  Gillis Santa, MD Pain Management 06/16/2021   Allergies:  Gabapentin and Bactrim [sulfamethoxazole-trimethoprim]  Current Home Medications:   No current facility-administered medications for this encounter.    furosemide (LASIX)  40 MG  tablet   apixaban (ELIQUIS) 5 MG TABS tablet   aspirin EC 81 MG tablet   atorvastatin (LIPITOR) 80 MG tablet   citalopram (CELEXA) 40 MG tablet   ferrous sulfate 325 (65 FE) MG tablet   isosorbide mononitrate (IMDUR) 60 MG 24 hr tablet   metoprolol tartrate (LOPRESSOR) 50 MG tablet   Multiple Vitamin (MULTIVITAMIN WITH MINERALS) TABS tablet   nitroGLYCERIN (NITROSTAT) 0.4 MG SL tablet   omeprazole (PRILOSEC) 20 MG capsule   oxyCODONE-acetaminophen (PERCOCET) 5-325 MG tablet   pregabalin (LYRICA) 100 MG capsule   traMADol (ULTRAM) 50 MG tablet   History:   Past Medical History:  Diagnosis Date   Abdominal aortic atherosclerosis ([place])    a. 05/2017 CTA abd/pelvis: significant atherosclerotic dzs of the infrarenal abd Ao w/ some mural thrombus. No aneurysm or dissection.   Acute focal ischemia of small intestine (HCC)    Acute right-sided low back pain with right-sided sciatica 06/13/2017   AKI (acute kidney injury) ([place]) 07/29/2017   Anemia    Anginal pain ([place])    a. 08/2012 Lexiscan MV: EF 54%, non ischemia/infarct.   Anxiety and depression    Baker's cyst of knee, right    a. 07/2016 U/S: 4.1 x 1.4 x 2.9 cystic structure in R poplitetal fossa.   Bell's palsy    CAD (coronary artery disease)    CHF (congestive heart failure) ([place])    a.) TTE 06/20/2017: EF 60-65%, no rwma, G1DD, no source of cardiac emboli.   Chronic anticoagulation    a.) apixaban   Chronic idiopathic constipation    Chronic mesenteric ischemia (HCC)    Chronic pain    COPD (chronic obstructive pulmonary disease) (HCC)    Dyspnea    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[place])    a. 05/2017 CTA Abd/pelvis: apparent thrombus or embolus in prox SMA (70-90%); b. 05/2017 catheter directed tPA, mechanical thrombectomy, and stenting of the SMA.   Essential hypertension with goal blood pressure less than 140/90 01/19/2016   Gastroesophageal reflux disease 01/19/2016   GERD (gastroesophageal reflux disease)    H/O  colectomy    History of 2019 novel coronavirus disease (COVID-19) 11/14/2019   History of kidney stones    Hyperlipidemia    Hypertension    Morbid obesity with BMI of 40.0-44.9, adult ([place])    NSTEMI (non-ST elevated myocardial infarction) ([place]) 06/27/2021   a.) high sensitivity troponins trended: 893 --> 1587 --> 1748 --> 868 --> 735 ng/L   Occlusive mesenteric ischemia ([place]) 06/19/2017   Osteoarthritis    PAD (peripheral artery disease) (HCC)    PAF (paroxysmal atrial fibrillation) (HCC)    a.) CHA2DS2-VASc = 6 (sex, CHF, HTN, DVT x2, aortic plaque). b.) rate/rhythm maintained on oral metoprolol tartrate; chronically anticoagulated with standard dose apixaban.   Palpitations    Peripheral edema    Personal history of other malignant neoplasm of skin 06/01/2016   Pneumonia    Pre-diabetes    Prediabetes    Primary osteoarthritis of both knees 06/13/2017   SBO (small bowel obstruction) ([place]) 08/06/2017   Skin cancer of nose    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HCC)    Vitamin D deficiency    Past Surgical History:  Procedure Laterality Date   AMPUTATION TOE Left 12/09/2019   Procedure: AMPUTATION TOE MPJ left;  Surgeon: Caroline More, DPM;  Location: [facility];  Service: Podiatry;  Laterality: Left;   APPENDECTOMY     APPLICATION OF WOUND VAC  12/01/2021  Procedure: APPLICATION OF WOUND VAC;  Surgeon: Algernon Huxley, MD;  Location: [facility];  Service: Vascular;;   CHOLECYSTECTOMY     COLON SURGERY     COLONOSCOPY WITH PROPOFOL N/A 03/23/2022   Procedure: COLONOSCOPY WITH PROPOFOL;  Surgeon: Jonathon Bellows, MD;  [facility];  Service: Gastroenterology;  Laterality: N/A;  rectal bleed   EMBOLECTOMY OR THROMBECTOMY, WITH OR WITHOUT CATHETER; FEMOROPOPLITEAL, AORTOILIAC ARTERY, BY LEG INCISION N/A 06/21/2020   Left - Decomp Fasciotomy Leg; Ant &/Or Lat Compart Only; Midline - Angiography, Visceral, Selective Or Supraselective (With Or Without Flush Aortogram); Left -  Revascularize, Endovasc, Open/Percut, Iliac Artery, Unilat, Initial Vessel; W/Translum Stent, W/Angioplasty; Right - Revascularize, Endovasc, Open/Percut, Iliac Artery, Ea Add`L Ipsilateral; W/Translumin Stent, W/Angioplasty; Location [facility];   ENDARTERECTOMY FEMORAL Left 12/01/2021   Procedure: ENDARTERECTOMY FEMORAL;  Surgeon: Algernon Huxley, MD;  Location: [facility];  Service: Vascular;  Laterality: Left;   ENDOVASCULAR REPAIR/STENT GRAFT Left 12/01/2021   Procedure: ENDOVASCULAR REPAIR/STENT GRAFT;  Surgeon: Algernon Huxley, MD;  Location: [place] [facility];  Service: Cardiovascular;  Laterality: Left;   LAPAROTOMY N/A 08/08/2017   Procedure: EXPLORATORY LAPAROTOMY POSSIBLE BOWEL RESECTION;  Surgeon: Jules Husbands, MD;  Location: [facility];  Service: General;  Laterality: N/A;   LEFT HEART CATH AND CORONARY ANGIOGRAPHY N/A 05/17/2021   Procedure: LEFT HEART CATH AND CORONARY ANGIOGRAPHY;  Surgeon: Yolonda Kida, MD;  Location: [place] [facility];  Service: Cardiovascular;  Laterality: N/A;   LOWER EXTREMITY ANGIOGRAPHY Left 11/17/2019   Procedure: LOWER EXTREMITY ANGIOGRAPHY;  Surgeon: Algernon Huxley, MD;  Location: [place] [facility];  Service: Cardiovascular;  Laterality: Left;   LOWER EXTREMITY ANGIOGRAPHY Right 12/13/2020   Procedure: LOWER EXTREMITY ANGIOGRAPHY;  Surgeon: Algernon Huxley, MD;  Location: [place] [facility];  Service: Cardiovascular;  Laterality: Right;   LOWER EXTREMITY ANGIOGRAPHY Right 01/10/2021   Procedure: LOWER EXTREMITY ANGIOGRAPHY;  Surgeon: Algernon Huxley, MD;  Location: [place] [facility];  Service: Cardiovascular;  Laterality: Right;   LOWER EXTREMITY ANGIOGRAPHY Right 01/24/2021   Procedure: LOWER EXTREMITY ANGIOGRAPHY;  Surgeon: Algernon Huxley, MD;  Location: [place] [facility];  Service: Cardiovascular;  Laterality: Right;   LOWER EXTREMITY ANGIOGRAPHY Bilateral 06/28/2021   Procedure: Lower Extremity Angiography;  Surgeon: Algernon Huxley, MD;  Location: [place]  [facility];  Service: Cardiovascular;  Laterality: Bilateral;   LOWER EXTREMITY ANGIOGRAPHY Right 11/28/2021   Procedure: LOWER EXTREMITY ANGIOGRAPHY;  Surgeon: Algernon Huxley, MD;  Location: [place] [facility];  Service: Cardiovascular;  Laterality: Right;   LOWER EXTREMITY ANGIOGRAPHY Left 11/30/2021   Procedure: Lower Extremity Angiography;  Surgeon: Algernon Huxley, MD;  Location: [place] [facility];  Service: Cardiovascular;  Laterality: Left;   LOWER EXTREMITY ANGIOGRAPHY Right 02/22/2022   Procedure: Lower Extremity Angiography;  Surgeon: Algernon Huxley, MD;  Location: [place] [facility];  Service: Cardiovascular;  Laterality: Right;   LOWER EXTREMITY ANGIOGRAPHY Right 03/13/2022   Procedure: Lower Extremity Angiography;  Surgeon: Algernon Huxley, MD;  Location: [place] [facility];  Service: Cardiovascular;  Laterality: Right;   LOWER EXTREMITY ANGIOGRAPHY Right 03/24/2022   Procedure: Lower Extremity Angiography;  Surgeon: Algernon Huxley, MD;  Location: [place] [facility];  Service: Cardiovascular;  Laterality: Right;   LOWER EXTREMITY INTERVENTION N/A 11/19/2019   Procedure: LOWER EXTREMITY INTERVENTION;  Surgeon: Algernon Huxley, MD;  Location: [place] [facility];  Service: Cardiovascular;  Laterality: N/A;   LOWER EXTREMITY  INTERVENTION Bilateral 06/29/2021   Procedure: LOWER EXTREMITY INTERVENTION;  Surgeon: Algernon Huxley, MD;  Location: [place] [facility];  Service: Cardiovascular;  Laterality: Bilateral;   TEE WITHOUT CARDIOVERSION N/A 06/22/2017   Procedure: TRANSESOPHAGEAL ECHOCARDIOGRAM (TEE);  Surgeon: Wellington Hampshire, MD;  Location: [facility];  Service: Cardiovascular;  Laterality: N/A;   TOTAL KNEE ARTHROPLASTY Right 07/11/2018   Procedure: TOTAL KNEE ARTHROPLASTY;  Surgeon: Corky Mull, MD;  Location: [facility];  Service: Orthopedics;  Laterality: Right;   VAGINAL HYSTERECTOMY     VISCERAL ARTERY INTERVENTION N/A 06/20/2017   Procedure: Visceral Artery Intervention, possible aortic  thrombectomy;  Surgeon: Algernon Huxley, MD;  Location: [place] [facility];  Service: Cardiovascular;  Laterality: N/A;   VISCERAL ARTERY INTERVENTION N/A 01/28/2018   Procedure: VISCERAL ARTERY INTERVENTION;  Surgeon: Algernon Huxley, MD;  Location: [place] [facility];  Service: Cardiovascular;  Laterality: N/A;   Family History  Problem Relation Age of Onset   Hypertension Mother    Heart disease Mother    Heart attack Mother    Breast cancer Mother    Hypertension Father    Breast cancer Paternal Aunt    Social History   Tobacco Use   Smoking status: Former    Packs/day: 1.00    Types: Cigarettes    Quit date: 2023    Years since quitting: 0.4   Smokeless tobacco: Never  Vaping Use   Vaping Use: Never used  Substance Use Topics   Alcohol use: No   Drug use: No    Pertinent Clinical Results:  LABS: Labs reviewed: Acceptable for surgery.  Lab Results  Component Value Date   WBC 5.3 03/24/2022   HGB 10.2 (L) 03/24/2022   HCT 33.4 (L) 03/24/2022   MCV 84.1 03/24/2022   PLT 255 03/24/2022   Lab Results  Component Value Date   NA 140 03/24/2022   K 4.4 03/24/2022   CO2 27 03/24/2022   GLUCOSE 120 (H) 03/24/2022   BUN 14 03/24/2022   CREATININE 0.88 03/24/2022   CALCIUM 8.8 (L) 03/24/2022   GFRNONAA >60 03/24/2022    ECG: Date: 02/03/2022 Time: 1711 PM Rate: 98 bpm Rhythm: normal sinus Axis (leads I and aVF): Normal Intervals: PR 142 ms. QRS 76 ms. QTc 446 ms. ST segment and T wave changes: No evidence of acute ST segment elevation or depression; evidence of age undetermined inferior and anterior infarcts noted. Comparison: Similar to previous tracing obtained on 02/03/2022   IMAGING / PROCEDURES: CT ABDOMEN PELVIS WO CONTRAST performed on 03/21/2022 Large RIGHT lower quadrant ventral hernia unchanged. No evidence of incarceration or bowel obstruction No acute intra-abdominal or intrapelvic process. Aortic atherosclerosis   DIAGNOSTIC RADIOGRAPHS OF  CHEST 2 VIEWS performed on 02/03/2022 No pulmonary infiltrate, pleural effusion, or pneumothorax. No acute osseous findings. No acute abnormalities.   LEFT HEART CATHETERIZATION AND CORONARY ANGIOGRAPHY performed on 05/17/2021  Normal left ventricular systolic function with an EF of at least 55% Left main free of disease LAD large with minor irregularities 50% stenosis of the diagonal 1 LCx large and free of disease 60 to 70% stenosis of the proximal RCA LVEDP normal Intervention deferred; recommend medical therapy   MYOCARDIAL PERFUSION IMAGING STUDY (LEXISCAN) performed on 10/05/2020 LVEF 25-30% Global hypokinesis Artifacts noted Left ventricular cavity size enlarged Significant defect in the inferior region relatively persistent with no clear evidence of redistribution  TRANSTHORACIC ECHOCARDIOGRAM performed on 06/22/2020 Left ventricular cavity size normal with mildly increased wall thickness Left  ventricular systolic function normal with an EF of 60-65% There is grade 1 diastolic dysfunction (impaired relaxation) Aortic valve is trileaflet with mildly thickened leaflets with normal excursion The right ventricle is normal in size with normal systolic function  Impression and Plan:  NAFEESAH LAPAGLIA has been referred for pre-anesthesia review and clearance prior to her undergoing the planned anesthetic and procedural courses. Available labs, pertinent testing, and imaging results were personally reviewed by me. This patient has been appropriately cleared by cardiology with an overall LOW risk of significant perioperative cardiovascular complications.  Based on clinical review performed today (04/05/22), barring any significant acute changes in the patient's overall condition, it is anticipated that she will be able to proceed with the planned surgical intervention. Any acute changes in clinical condition may necessitate her procedure being postponed and/or cancelled. Patient will  meet with anesthesia team (MD and/or CRNA) on the day of her procedure for preoperative evaluation/assessment. Questions regarding anesthetic course will be fielded at that time.   Pre-surgical instructions were reviewed with the patient during her PAT appointment and questions were fielded by PAT clinical staff. Patient was advised that if any questions or concerns arise prior to her procedure then she should return a call to PAT and/or her surgeon's office to discuss.  Honor Loh, MSN, APRN, FNP-C, CEN [facility]  Peri-operative Services Nurse Practitioner Phone: [phone] Fax: [phone] 04/05/22 11:03 AM  NOTE: This note has been prepared using Dragon dictation software. Despite my best ability to proofread, there is always the potential that unintentional transcriptional errors may still occur from this process.

## 2022-04-06 ENCOUNTER — Encounter
Admission: RE | Disposition: A | Discharge status: Skilled Nursing Facility | Payer: Self-pay | Source: Home / Self Care | Attending: Vascular Surgery

## 2022-04-06 ENCOUNTER — Inpatient Hospital Stay: Payer: Medicare Other | Admitting: Urgent Care

## 2022-04-06 ENCOUNTER — Other Ambulatory Visit: Payer: Self-pay

## 2022-04-06 ENCOUNTER — Encounter: Payer: Self-pay | Admitting: Vascular Surgery

## 2022-04-06 ENCOUNTER — Inpatient Hospital Stay
Admission: RE | Admit: 2022-04-06 | Discharge: 2022-04-12 | DRG: 240 | Disposition: A | Discharge status: Skilled Nursing Facility | Payer: Medicare Other | Attending: Vascular Surgery | Admitting: Vascular Surgery

## 2022-04-06 DIAGNOSIS — F419 Anxiety disorder, unspecified: Secondary | ICD-10-CM | POA: Diagnosis present

## 2022-04-06 DIAGNOSIS — I252 Old myocardial infarction: Secondary | ICD-10-CM

## 2022-04-06 DIAGNOSIS — I70221 Atherosclerosis of native arteries of extremities with rest pain, right leg: Secondary | ICD-10-CM | POA: Diagnosis present

## 2022-04-06 DIAGNOSIS — K439 Ventral hernia without obstruction or gangrene: Secondary | ICD-10-CM | POA: Diagnosis present

## 2022-04-06 DIAGNOSIS — Z85828 Personal history of other malignant neoplasm of skin: Secondary | ICD-10-CM | POA: Diagnosis not present

## 2022-04-06 DIAGNOSIS — I110 Hypertensive heart disease with heart failure: Secondary | ICD-10-CM | POA: Diagnosis present

## 2022-04-06 DIAGNOSIS — Z01812 Encounter for preprocedural laboratory examination: Secondary | ICD-10-CM

## 2022-04-06 DIAGNOSIS — I480 Paroxysmal atrial fibrillation: Secondary | ICD-10-CM | POA: Diagnosis present

## 2022-04-06 DIAGNOSIS — Z8719 Personal history of other diseases of the digestive system: Secondary | ICD-10-CM

## 2022-04-06 DIAGNOSIS — I700 Atherosclerosis of aorta: Secondary | ICD-10-CM | POA: Diagnosis present

## 2022-04-06 DIAGNOSIS — J449 Chronic obstructive pulmonary disease, unspecified: Secondary | ICD-10-CM | POA: Diagnosis present

## 2022-04-06 DIAGNOSIS — K219 Gastro-esophageal reflux disease without esophagitis: Secondary | ICD-10-CM | POA: Diagnosis present

## 2022-04-06 DIAGNOSIS — Z9049 Acquired absence of other specified parts of digestive tract: Secondary | ICD-10-CM

## 2022-04-06 DIAGNOSIS — I5032 Chronic diastolic (congestive) heart failure: Secondary | ICD-10-CM | POA: Diagnosis present

## 2022-04-06 DIAGNOSIS — G8929 Other chronic pain: Secondary | ICD-10-CM | POA: Diagnosis present

## 2022-04-06 DIAGNOSIS — Z6841 Body Mass Index (BMI) 40.0 and over, adult: Secondary | ICD-10-CM

## 2022-04-06 DIAGNOSIS — Z86718 Personal history of other venous thrombosis and embolism: Secondary | ICD-10-CM | POA: Diagnosis not present

## 2022-04-06 DIAGNOSIS — Z87891 Personal history of nicotine dependence: Secondary | ICD-10-CM

## 2022-04-06 DIAGNOSIS — M79671 Pain in right foot: Secondary | ICD-10-CM

## 2022-04-06 DIAGNOSIS — Z888 Allergy status to other drugs, medicaments and biological substances status: Secondary | ICD-10-CM

## 2022-04-06 DIAGNOSIS — Z87442 Personal history of urinary calculi: Secondary | ICD-10-CM

## 2022-04-06 DIAGNOSIS — Z8249 Family history of ischemic heart disease and other diseases of the circulatory system: Secondary | ICD-10-CM | POA: Diagnosis not present

## 2022-04-06 DIAGNOSIS — Z881 Allergy status to other antibiotic agents status: Secondary | ICD-10-CM

## 2022-04-06 DIAGNOSIS — Z803 Family history of malignant neoplasm of breast: Secondary | ICD-10-CM

## 2022-04-06 DIAGNOSIS — E785 Hyperlipidemia, unspecified: Secondary | ICD-10-CM | POA: Diagnosis present

## 2022-04-06 DIAGNOSIS — Z7901 Long term (current) use of anticoagulants: Secondary | ICD-10-CM

## 2022-04-06 DIAGNOSIS — Z8616 Personal history of COVID-19: Secondary | ICD-10-CM | POA: Diagnosis not present

## 2022-04-06 DIAGNOSIS — Z89422 Acquired absence of other left toe(s): Secondary | ICD-10-CM

## 2022-04-06 DIAGNOSIS — I70229 Atherosclerosis of native arteries of extremities with rest pain, unspecified extremity: Secondary | ICD-10-CM | POA: Diagnosis present

## 2022-04-06 DIAGNOSIS — I2510 Atherosclerotic heart disease of native coronary artery without angina pectoris: Secondary | ICD-10-CM | POA: Diagnosis present

## 2022-04-06 DIAGNOSIS — I998 Other disorder of circulatory system: Secondary | ICD-10-CM

## 2022-04-06 DIAGNOSIS — E559 Vitamin D deficiency, unspecified: Secondary | ICD-10-CM | POA: Diagnosis present

## 2022-04-06 DIAGNOSIS — F32A Depression, unspecified: Secondary | ICD-10-CM | POA: Diagnosis present

## 2022-04-06 HISTORY — PX: AMPUTATION: SHX166

## 2022-04-06 HISTORY — DX: Atherosclerotic heart disease of native coronary artery without angina pectoris: I25.10

## 2022-04-06 LAB — BASIC METABOLIC PANEL
Anion gap: 7 (ref 5–15)
BUN: 13 mg/dL (ref 8–23)
CO2: 23 mmol/L (ref 22–32)
Calcium: 8.6 mg/dL — ABNORMAL LOW (ref 8.9–10.3)
Chloride: 110 mmol/L (ref 98–111)
Creatinine, Ser: 0.72 mg/dL (ref 0.44–1.00)
GFR, Estimated: >60 mL/min (ref >60)
Glucose, Bld: 109 mg/dL — ABNORMAL HIGH (ref 70–99)
Potassium: 3.6 mmol/L (ref 3.5–5.1)
Sodium: 140 mmol/L (ref 135–145)

## 2022-04-06 LAB — CBC
HCT: 33.9 % — ABNORMAL LOW (ref 36.0–46.0)
Hemoglobin: 10.2 g/dL — ABNORMAL LOW (ref 12.0–15.0)
MCH: 25.2 pg — ABNORMAL LOW (ref 26.0–34.0)
MCHC: 30.1 g/dL (ref 30.0–36.0)
MCV: 83.9 fL (ref 80.0–100.0)
Platelets: 297 10*3/uL (ref 150–400)
RBC: 4.04 MIL/uL (ref 3.87–5.11)
RDW: 17.8 % — ABNORMAL HIGH (ref 11.5–15.5)
WBC: 7.6 10*3/uL (ref 4.0–10.5)
nRBC: 0 % (ref 0.0–0.2)

## 2022-04-06 SURGERY — AMPUTATION, ABOVE KNEE
Anesthesia: General | Site: Knee | Laterality: Right

## 2022-04-06 MED ORDER — MORPHINE SULFATE (PF) 2 MG/ML IV SOLN
2.0000 mg | INTRAVENOUS | Status: DC | PRN
  Administered 2022-04-06 – 2022-04-07 (×5): 2 mg via INTRAVENOUS
  Filled 2022-04-06 (×5): qty 1

## 2022-04-06 MED ORDER — GUAIFENESIN-DM 100-10 MG/5ML PO SYRP: 15.0000 mL | ORAL_SOLUTION | ORAL | Status: DC | PRN

## 2022-04-06 MED ORDER — ONDANSETRON HCL 4 MG/2ML IJ SOLN
INTRAMUSCULAR | Status: AC
  Filled 2022-04-06: qty 2

## 2022-04-06 MED ORDER — SUGAMMADEX SODIUM 200 MG/2ML IV SOLN
INTRAVENOUS | Status: DC | PRN
  Administered 2022-04-06: 200 mg via INTRAVENOUS

## 2022-04-06 MED ORDER — TRAMADOL HCL 50 MG PO TABS
50.0000 mg | ORAL_TABLET | Freq: Four times a day (QID) | ORAL | Status: DC | PRN
  Administered 2022-04-07 – 2022-04-12 (×7): 50 mg via ORAL
  Filled 2022-04-06 (×7): qty 1

## 2022-04-06 MED ORDER — DEXAMETHASONE SODIUM PHOSPHATE 10 MG/ML IJ SOLN
INTRAMUSCULAR | Status: DC | PRN
  Administered 2022-04-06: 5 mg via INTRAVENOUS

## 2022-04-06 MED ORDER — FENTANYL CITRATE (PF) 100 MCG/2ML IJ SOLN
INTRAMUSCULAR | Status: AC
  Filled 2022-04-06: qty 2

## 2022-04-06 MED ORDER — CHLORHEXIDINE GLUCONATE CLOTH 2 % EX PADS
6.0000 | MEDICATED_PAD | Freq: Once | CUTANEOUS | Status: AC
  Administered 2022-04-06: 6 via TOPICAL

## 2022-04-06 MED ORDER — ROCURONIUM BROMIDE 100 MG/10ML IV SOLN
INTRAVENOUS | Status: DC | PRN
  Administered 2022-04-06: 40 mg via INTRAVENOUS

## 2022-04-06 MED ORDER — FENTANYL CITRATE (PF) 100 MCG/2ML IJ SOLN
25.0000 ug | INTRAMUSCULAR | Status: AC | PRN
  Administered 2022-04-06: 50 ug via INTRAVENOUS
  Administered 2022-04-06 (×3): 25 ug via INTRAVENOUS

## 2022-04-06 MED ORDER — FENTANYL CITRATE PF 50 MCG/ML IJ SOSY
PREFILLED_SYRINGE | INTRAMUSCULAR | Status: AC
  Administered 2022-04-06: 50 ug via INTRAVENOUS
  Filled 2022-04-06: qty 1

## 2022-04-06 MED ORDER — OXYCODONE HCL 5 MG/5ML PO SOLN: 5.0000 mg | Freq: Once | ORAL | Status: DC | PRN

## 2022-04-06 MED ORDER — ONDANSETRON HCL 4 MG/2ML IJ SOLN
4.0000 mg | Freq: Four times a day (QID) | INTRAMUSCULAR | Status: DC | PRN
  Administered 2022-04-06: 4 mg via INTRAVENOUS

## 2022-04-06 MED ORDER — PHENOL 1.4 % MT LIQD: 1.0000 | OROMUCOSAL | Status: DC | PRN

## 2022-04-06 MED ORDER — MIDAZOLAM HCL 2 MG/2ML IJ SOLN
INTRAMUSCULAR | Status: DC | PRN
  Administered 2022-04-06: 1 mg via INTRAVENOUS

## 2022-04-06 MED ORDER — CHLORHEXIDINE GLUCONATE 0.12 % MT SOLN
15.0000 mL | Freq: Once | OROMUCOSAL | Status: AC
  Administered 2022-04-06: 15 mL via OROMUCOSAL

## 2022-04-06 MED ORDER — ONDANSETRON HCL 4 MG/2ML IJ SOLN
INTRAMUSCULAR | Status: DC | PRN
  Administered 2022-04-06: 4 mg via INTRAVENOUS

## 2022-04-06 MED ORDER — LACTATED RINGERS IV SOLN: INTRAVENOUS | Status: DC

## 2022-04-06 MED ORDER — ONDANSETRON HCL 4 MG/2ML IJ SOLN
4.0000 mg | Freq: Four times a day (QID) | INTRAMUSCULAR | Status: DC | PRN
Start: 2022-04-06 — End: 2022-04-06
  Filled 2022-04-06: qty 2

## 2022-04-06 MED ORDER — SODIUM CHLORIDE 0.9 % IV SOLN: INTRAVENOUS | Status: DC

## 2022-04-06 MED ORDER — DEXMEDETOMIDINE (PRECEDEX) IN NS 20 MCG/5ML (4 MCG/ML) IV SYRINGE
PREFILLED_SYRINGE | INTRAVENOUS | Status: DC | PRN
  Administered 2022-04-06: 8 ug via INTRAVENOUS

## 2022-04-06 MED ORDER — ASPIRIN 81 MG PO TBEC
81.0000 mg | DELAYED_RELEASE_TABLET | Freq: Every day | ORAL | Status: DC
  Administered 2022-04-07 – 2022-04-12 (×6): 81 mg via ORAL
  Filled 2022-04-06 (×7): qty 1

## 2022-04-06 MED ORDER — METOPROLOL TARTRATE 50 MG PO TABS
50.0000 mg | ORAL_TABLET | Freq: Two times a day (BID) | ORAL | Status: DC
  Administered 2022-04-07 – 2022-04-12 (×9): 50 mg via ORAL
  Filled 2022-04-06 (×11): qty 1

## 2022-04-06 MED ORDER — MIDAZOLAM HCL 2 MG/2ML IJ SOLN
INTRAMUSCULAR | Status: AC
  Filled 2022-04-06: qty 2

## 2022-04-06 MED ORDER — EPHEDRINE SULFATE (PRESSORS) 50 MG/ML IJ SOLN
INTRAMUSCULAR | Status: DC | PRN
  Administered 2022-04-06 (×2): 10 mg via INTRAVENOUS

## 2022-04-06 MED ORDER — ACETAMINOPHEN 10 MG/ML IV SOLN
INTRAVENOUS | Status: AC
  Filled 2022-04-06: qty 100

## 2022-04-06 MED ORDER — ACETAMINOPHEN 10 MG/ML IV SOLN
INTRAVENOUS | Status: DC | PRN
  Administered 2022-04-06: 1000 mg via INTRAVENOUS

## 2022-04-06 MED ORDER — FAMOTIDINE IN NACL 20-0.9 MG/50ML-% IV SOLN
20.0000 mg | Freq: Two times a day (BID) | INTRAVENOUS | Status: DC
  Administered 2022-04-06 – 2022-04-12 (×12): 20 mg via INTRAVENOUS
  Filled 2022-04-06 (×12): qty 50

## 2022-04-06 MED ORDER — FERROUS SULFATE 325 (65 FE) MG PO TABS
325.0000 mg | ORAL_TABLET | Freq: Every day | ORAL | Status: DC
  Administered 2022-04-06 – 2022-04-12 (×7): 325 mg via ORAL
  Filled 2022-04-06 (×7): qty 1

## 2022-04-06 MED ORDER — ATORVASTATIN CALCIUM 20 MG PO TABS
80.0000 mg | ORAL_TABLET | Freq: Every day | ORAL | Status: DC
  Administered 2022-04-07 – 2022-04-11 (×5): 80 mg via ORAL
  Filled 2022-04-06 (×6): qty 4

## 2022-04-06 MED ORDER — FENTANYL CITRATE PF 50 MCG/ML IJ SOSY: 50.0000 ug | PREFILLED_SYRINGE | Freq: Once | INTRAMUSCULAR | Status: AC

## 2022-04-06 MED ORDER — LIDOCAINE HCL (CARDIAC) PF 100 MG/5ML IV SOSY
PREFILLED_SYRINGE | INTRAVENOUS | Status: DC | PRN
  Administered 2022-04-06: 100 mg via INTRAVENOUS

## 2022-04-06 MED ORDER — HYDRALAZINE HCL 20 MG/ML IJ SOLN: 5.0000 mg | INTRAMUSCULAR | Status: DC | PRN

## 2022-04-06 MED ORDER — HYDROMORPHONE HCL 1 MG/ML IJ SOLN
1.0000 mg | Freq: Once | INTRAMUSCULAR | Status: AC | PRN
  Administered 2022-04-06: 1 mg via INTRAVENOUS
  Filled 2022-04-06: qty 1

## 2022-04-06 MED ORDER — LIDOCAINE HCL (PF) 2 % IJ SOLN
INTRAMUSCULAR | Status: AC
  Filled 2022-04-06: qty 5

## 2022-04-06 MED ORDER — IPRATROPIUM-ALBUTEROL 0.5-2.5 (3) MG/3ML IN SOLN
3.0000 mL | Freq: Four times a day (QID) | RESPIRATORY_TRACT | Status: DC
Start: 2022-04-06 — End: 2022-04-08
  Administered 2022-04-06 – 2022-04-08 (×6): 3 mL via RESPIRATORY_TRACT
  Filled 2022-04-06 (×6): qty 3

## 2022-04-06 MED ORDER — PHENYLEPHRINE HCL-NACL 20-0.9 MG/250ML-% IV SOLN
INTRAVENOUS | Status: DC | PRN
  Administered 2022-04-06: 25 ug/min via INTRAVENOUS

## 2022-04-06 MED ORDER — MAGNESIUM SULFATE 2 GM/50ML IV SOLN: 2.0000 g | Freq: Every day | INTRAVENOUS | Status: DC | PRN

## 2022-04-06 MED ORDER — METOPROLOL TARTRATE 5 MG/5ML IV SOLN: 2.0000 mg | INTRAVENOUS | Status: DC | PRN

## 2022-04-06 MED ORDER — PANTOPRAZOLE SODIUM 40 MG PO TBEC
40.0000 mg | DELAYED_RELEASE_TABLET | Freq: Every day | ORAL | Status: DC
  Administered 2022-04-06 – 2022-04-12 (×7): 40 mg via ORAL
  Filled 2022-04-06 (×7): qty 1

## 2022-04-06 MED ORDER — APIXABAN 5 MG PO TABS
5.0000 mg | ORAL_TABLET | Freq: Two times a day (BID) | ORAL | Status: DC
  Administered 2022-04-07 – 2022-04-12 (×11): 5 mg via ORAL
  Filled 2022-04-06 (×11): qty 1

## 2022-04-06 MED ORDER — OXYCODONE-ACETAMINOPHEN 5-325 MG PO TABS
1.0000 | ORAL_TABLET | Freq: Four times a day (QID) | ORAL | Status: DC | PRN
  Administered 2022-04-06 – 2022-04-07 (×3): 2 via ORAL
  Filled 2022-04-06 (×3): qty 2

## 2022-04-06 MED ORDER — LABETALOL HCL 5 MG/ML IV SOLN: 10.0000 mg | INTRAVENOUS | Status: DC | PRN

## 2022-04-06 MED ORDER — OXYCODONE HCL 5 MG PO TABS: 5.0000 mg | ORAL_TABLET | Freq: Once | ORAL | Status: DC | PRN

## 2022-04-06 MED ORDER — POTASSIUM CHLORIDE CRYS ER 20 MEQ PO TBCR: 20.0000 meq | EXTENDED_RELEASE_TABLET | Freq: Every day | ORAL | Status: DC | PRN

## 2022-04-06 MED ORDER — FENTANYL CITRATE (PF) 100 MCG/2ML IJ SOLN
INTRAMUSCULAR | Status: DC | PRN
  Administered 2022-04-06 (×4): 25 ug via INTRAVENOUS

## 2022-04-06 MED ORDER — PROPOFOL 10 MG/ML IV BOLUS
INTRAVENOUS | Status: AC
  Filled 2022-04-06: qty 20

## 2022-04-06 MED ORDER — FENTANYL CITRATE (PF) 100 MCG/2ML IJ SOLN
INTRAMUSCULAR | Status: AC
  Administered 2022-04-06: 25 ug via INTRAVENOUS
  Filled 2022-04-06: qty 2

## 2022-04-06 MED ORDER — DOCUSATE SODIUM 100 MG PO CAPS
100.0000 mg | ORAL_CAPSULE | Freq: Every day | ORAL | Status: DC
  Administered 2022-04-07 – 2022-04-12 (×6): 100 mg via ORAL
  Filled 2022-04-06 (×6): qty 1

## 2022-04-06 MED ORDER — NITROGLYCERIN 0.4 MG SL SUBL
0.4000 mg | SUBLINGUAL_TABLET | SUBLINGUAL | Status: DC
Start: 2022-04-06 — End: 2022-04-12

## 2022-04-06 MED ORDER — ALUM & MAG HYDROXIDE-SIMETH 200-200-20 MG/5ML PO SUSP: 15.0000 mL | ORAL | Status: DC | PRN

## 2022-04-06 MED ORDER — CHLORHEXIDINE GLUCONATE 0.12 % MT SOLN
OROMUCOSAL | Status: AC
  Filled 2022-04-06: qty 15

## 2022-04-06 MED ORDER — 0.9 % SODIUM CHLORIDE (POUR BTL) OPTIME
TOPICAL | Status: DC | PRN
  Administered 2022-04-06: 1000 mL

## 2022-04-06 MED ORDER — AMLODIPINE BESYLATE 5 MG PO TABS
5.0000 mg | ORAL_TABLET | Freq: Every day | ORAL | Status: DC
  Administered 2022-04-06 – 2022-04-12 (×7): 5 mg via ORAL
  Filled 2022-04-06 (×7): qty 1

## 2022-04-06 MED ORDER — ISOSORBIDE MONONITRATE ER 30 MG PO TB24
60.0000 mg | ORAL_TABLET | Freq: Every day | ORAL | Status: DC
  Administered 2022-04-06 – 2022-04-12 (×7): 60 mg via ORAL
  Filled 2022-04-06 (×7): qty 2

## 2022-04-06 MED ORDER — POLYETHYLENE GLYCOL 3350 17 G PO PACK
17.0000 g | PACK | Freq: Every day | ORAL | Status: DC | PRN
  Administered 2022-04-10 – 2022-04-11 (×2): 17 g via ORAL
  Filled 2022-04-06 (×2): qty 1

## 2022-04-06 MED ORDER — FENTANYL CITRATE (PF) 100 MCG/2ML IJ SOLN
INTRAMUSCULAR | Status: AC
  Administered 2022-04-06: 50 ug via INTRAVENOUS
  Filled 2022-04-06: qty 2

## 2022-04-06 MED ORDER — CEFAZOLIN SODIUM-DEXTROSE 2-4 GM/100ML-% IV SOLN
2.0000 g | INTRAVENOUS | Status: AC
  Administered 2022-04-06: 2 g via INTRAVENOUS

## 2022-04-06 MED ORDER — ORAL CARE MOUTH RINSE: 15.0000 mL | Freq: Once | OROMUCOSAL | Status: AC

## 2022-04-06 MED ORDER — CITALOPRAM HYDROBROMIDE 20 MG PO TABS
40.0000 mg | ORAL_TABLET | Freq: Every day | ORAL | Status: DC
Start: 2022-04-06 — End: 2022-04-12
  Administered 2022-04-06 – 2022-04-12 (×7): 40 mg via ORAL
  Filled 2022-04-06 (×7): qty 2

## 2022-04-06 MED ORDER — CEFAZOLIN SODIUM-DEXTROSE 2-4 GM/100ML-% IV SOLN
INTRAVENOUS | Status: AC
  Filled 2022-04-06: qty 100

## 2022-04-06 MED ORDER — PREGABALIN 50 MG PO CAPS
100.0000 mg | ORAL_CAPSULE | Freq: Three times a day (TID) | ORAL | Status: DC
  Administered 2022-04-06 – 2022-04-12 (×17): 100 mg via ORAL
  Filled 2022-04-06 (×17): qty 2

## 2022-04-06 MED ORDER — SORBITOL 70 % SOLN
30.0000 mL | Freq: Every day | Status: DC | PRN
Start: 2022-04-06 — End: 2022-04-12

## 2022-04-06 MED ORDER — KETOROLAC TROMETHAMINE 30 MG/ML IJ SOLN
30.0000 mg | Freq: Four times a day (QID) | INTRAMUSCULAR | Status: AC | PRN
  Administered 2022-04-06 – 2022-04-11 (×5): 30 mg via INTRAVENOUS
  Filled 2022-04-06 (×5): qty 1

## 2022-04-06 MED ORDER — GLYCOPYRROLATE 0.2 MG/ML IJ SOLN
INTRAMUSCULAR | Status: DC | PRN
  Administered 2022-04-06: .2 mg via INTRAVENOUS

## 2022-04-06 MED ORDER — FUROSEMIDE 40 MG PO TABS
40.0000 mg | ORAL_TABLET | Freq: Every day | ORAL | Status: DC
  Administered 2022-04-06 – 2022-04-12 (×7): 40 mg via ORAL
  Filled 2022-04-06 (×7): qty 1

## 2022-04-06 MED ORDER — PROPOFOL 10 MG/ML IV BOLUS
INTRAVENOUS | Status: DC | PRN
  Administered 2022-04-06: 100 mg via INTRAVENOUS

## 2022-04-06 MED ORDER — ENOXAPARIN SODIUM 40 MG/0.4ML IJ SOSY: 40.0000 mg | PREFILLED_SYRINGE | INTRAMUSCULAR | Status: DC

## 2022-04-06 MED ORDER — ADULT MULTIVITAMIN W/MINERALS CH
1.0000 | ORAL_TABLET | Freq: Every day | ORAL | Status: DC
  Administered 2022-04-06 – 2022-04-12 (×7): 1 via ORAL
  Filled 2022-04-06 (×7): qty 1

## 2022-04-06 SURGICAL SUPPLY — 37 items
BLADE SAGITTAL WIDE XTHICK NO (BLADE) ×2 IMPLANT
BNDG COHESIVE 4X5 TAN ST LF (GAUZE/BANDAGES/DRESSINGS) ×2 IMPLANT
BNDG ELASTIC 6X5.8 VLCR NS LF (GAUZE/BANDAGES/DRESSINGS) ×2 IMPLANT
BNDG GAUZE DERMACEA FLUFF (GAUZE/BANDAGES/DRESSINGS) ×2
BNDG GAUZE DERMACEA FLUFF 4 (GAUZE/BANDAGES/DRESSINGS) ×2 IMPLANT
BRUSH SCRUB EZ  4% CHG (MISCELLANEOUS) ×1
BRUSH SCRUB EZ 4% CHG (MISCELLANEOUS) ×1 IMPLANT
CHLORAPREP W/TINT 26 (MISCELLANEOUS) ×2 IMPLANT
DRAPE INCISE IOBAN 66X45 STRL (DRAPES) ×2 IMPLANT
DRAPE INCISE IOBAN 66X60 STRL (DRAPES) ×2 IMPLANT
ELECT CAUTERY BLADE 6.4 (BLADE) ×2 IMPLANT
ELECT REM PT RETURN 9FT ADLT (ELECTROSURGICAL) ×2
ELECTRODE REM PT RTRN 9FT ADLT (ELECTROSURGICAL) ×1 IMPLANT
GAUZE XEROFORM 1X8 LF (GAUZE/BANDAGES/DRESSINGS) ×4 IMPLANT
GLOVE BIO SURGEON STRL SZ7 (GLOVE) ×2 IMPLANT
GOWN STRL REUS W/ TWL LRG LVL3 (GOWN DISPOSABLE) ×1 IMPLANT
GOWN STRL REUS W/ TWL XL LVL3 (GOWN DISPOSABLE) ×1 IMPLANT
GOWN STRL REUS W/TWL LRG LVL3 (GOWN DISPOSABLE) ×1
GOWN STRL REUS W/TWL XL LVL3 (GOWN DISPOSABLE) ×1
HANDLE YANKAUER SUCT BULB TIP (MISCELLANEOUS) ×2 IMPLANT
KIT TURNOVER KIT A (KITS) ×2 IMPLANT
LABEL OR SOLS (LABEL) ×2 IMPLANT
MANIFOLD NEPTUNE II (INSTRUMENTS) ×2 IMPLANT
NS IRRIG 1000ML POUR BTL (IV SOLUTION) ×2 IMPLANT
PACK EXTREMITY ARMC (MISCELLANEOUS) ×2 IMPLANT
PAD ABD DERMACEA PRESS 5X9 (GAUZE/BANDAGES/DRESSINGS) ×4 IMPLANT
PAD PREP 24X41 OB/GYN DISP (PERSONAL CARE ITEMS) ×2 IMPLANT
SPONGE T-LAP 18X18 ~~LOC~~+RFID (SPONGE) ×5 IMPLANT
STAPLER SKIN PROX 35W (STAPLE) ×2 IMPLANT
STOCKINETTE M/LG 89821 (MISCELLANEOUS) ×2 IMPLANT
SUT SILK 2 0 (SUTURE) ×1
SUT SILK 2 0 SH (SUTURE) ×4 IMPLANT
SUT SILK 2-0 (SUTURE) ×2 IMPLANT
SUT SILK 2-0 18XBRD TIE 12 (SUTURE) ×1 IMPLANT
SUT VIC AB 0 CT1 36 (SUTURE) ×4 IMPLANT
SUT VIC AB 2-0 CT1 (SUTURE) ×4 IMPLANT
WATER STERILE IRR 500ML POUR (IV SOLUTION) ×2 IMPLANT

## 2022-04-06 NOTE — Interval H&P Note (Signed)
History and Physical Interval Note:  04/06/2022 1:53 PM  Carla [name]  has presented today for surgery, with the diagnosis of ASO WITH RESTPAIN.  The various methods of treatment have been discussed with the patient and family. After consideration of risks, benefits and other options for treatment, the patient has consented to  Procedure(s): AMPUTATION ABOVE KNEE (Right) as a surgical intervention.  The patient's history has been reviewed, patient examined, no change in status, stable for surgery.  I have reviewed the patient's chart and labs.  Questions were answered to the patient's satisfaction.     [name]

## 2022-04-06 NOTE — Op Note (Signed)
  [facility]   OPERATIVE NOTE   PROCEDURE:  Right above-the-knee amputation  PRE-OPERATIVE DIAGNOSIS: Right leg rest pain with irreversible ischemia and non-recontructable vascular disease  POST-OPERATIVE DIAGNOSIS: same as above  SURGEON:  [name], MD  ASSISTANT(S): none  ANESTHESIA: general  ESTIMATED BLOOD LOSS: 200 cc  FINDING(S): none  SPECIMEN(S):  Right above-the-knee amputation  INDICATIONS:   Carla [name] is a 61 y.o. female who presents with right leg rest pain and non-reconstructable vascular disease.  The patient is scheduled for a right above-the-knee amputation.  I discussed in depth with the patient the risks, benefits, and alternatives to this procedure.  The patient is aware that the risk of this operation included but are not limited to:  bleeding, infection, myocardial infarction, stroke, death, failure to heal amputation wound, and possible need for more proximal amputation.  The patient is aware of the risks and agrees proceed forward with the procedure.  DESCRIPTION: After full informed written consent was obtained from the patient, the patient was taken to the operating room, and placed supine upon the operating table.  Prior to induction, the patient received IV antibiotics.  The patient was then prepped and draped in the standard fashion for a right above-the-knee amputation.  After obtaining adequate anesthesia, the patient was prepped and draped in the standard fashion for a above-the-knee amputation.  I marked out the anterior and posterior flaps for a fish-mouth type of amputation.  I made the incisions for these flaps, and then dissected through the subcutaneous tissue, fascia, and muscles circumferentially.  I elevated  the periosteal tissue 4-5 cm more proximal than the anterior skin flap.  I then transected the femur with a power saw at this level.  Then I smoothed out the rough edges of the bone.  At this point, the specimen  was passed off the field as the above-the-knee amputation.  At this point, I clamped all visibly bleeding arteries and veins using a combination of suture ligation with silk suture and electrocautery.   Bleeding continued to be controlled with electrocautery and suture ligature.  The stump was washed off with sterile normal saline and no further active bleeding was noted.  I reapproximated the anterior and posterior fascia  with interrupted stitches of 0 Vicryl.  This was completed along the entire length of anterior and posterior fascia until there were no more loose space in the fascial line. The subcutaneous tissue was then approximated with 2-0 vicryl sutures. The skin was then  reapproximated with staples.  The stump was washed off and dried.  The incision was dressed with Xeroform and ABD pads, and  then fluffs were applied.  Kerlix was wrapped around the leg and then gently an ACE wrap was applied.  A large Ioban was then placed over the ACE wrap to secure the dressing. The patient was then awakened and take to the recovery room in stable condition.   COMPLICATIONS: none  CONDITION: stable  [name]  04/06/2022, 3:45 PM   This note was created with Dragon Medical transcription system. Any errors in dictation are purely unintentional.

## 2022-04-06 NOTE — Anesthesia Procedure Notes (Signed)
Procedure Name: Intubation Date/Time: 04/06/2022 2:22 PM  Performed by: Loletha Grayer, CRNAPre-anesthesia Checklist: Patient identified, Patient being monitored, Timeout performed, Emergency Drugs available and Suction available Patient Re-evaluated:Patient Re-evaluated prior to induction Oxygen Delivery Method: Circle system utilized Preoxygenation: Pre-oxygenation with 100% oxygen Induction Type: IV induction Ventilation: Mask ventilation without difficulty Laryngoscope Size: 3 and McGraph Grade View: Grade I Tube type: Oral Tube size: 7.0 mm Number of attempts: 1 Airway Equipment and Method: Stylet Placement Confirmation: ETT inserted through vocal cords under direct vision, positive ETCO2 and breath sounds checked- equal and bilateral Secured at: 20 cm Tube secured with: Tape Dental Injury: Teeth and Oropharynx as per pre-operative assessment

## 2022-04-06 NOTE — Transfer of Care (Signed)
Immediate Anesthesia Transfer of Care Note  Patient: Carla [name]  Procedure(s) Performed: AMPUTATION ABOVE KNEE (Right: Knee)  Patient Location: PACU  Anesthesia Type:General  Level of Consciousness: awake  Airway & Oxygen Therapy: Patient Spontanous Breathing and Patient connected to face mask oxygen  Post-op Assessment: Report given to RN and Post -op Vital signs reviewed and stable  Post vital signs: Reviewed and stable  Last Vitals:  Vitals Value Taken Time  BP 157/89 04/06/22 1548  Temp    Pulse 75 04/06/22 1548  Resp 14 04/06/22 1548  SpO2 100 % 04/06/22 1548  Vitals shown include unvalidated device data.  Last Pain:  Vitals:   04/06/22 1302  TempSrc: Temporal  PainSc: 10-Worst pain ever         Complications: No notable events documented.

## 2022-04-06 NOTE — Anesthesia Preprocedure Evaluation (Signed)
Anesthesia Evaluation  Patient identified by MRN, date of birth, ID band Patient awake    Reviewed: Allergy & Precautions, NPO status , Patient's Chart, lab work & pertinent test results  History of Anesthesia Complications Negative for: history of anesthetic complications  Airway Mallampati: III  TM Distance: >3 FB Neck ROM: full    Dental  (+) Missing   Pulmonary shortness of breath and with exertion, COPD, former smoker,    Pulmonary exam normal        Cardiovascular Exercise Tolerance: Poor hypertension, (-) angina+ CAD, + Past MI, + Peripheral Vascular Disease and +CHF  Normal cardiovascular exam     Neuro/Psych PSYCHIATRIC DISORDERS  Neuromuscular disease    GI/Hepatic Neg liver ROS, GERD  Controlled,  Endo/Other  negative endocrine ROS  Renal/GU Renal disease     Musculoskeletal   Abdominal   Peds  Hematology negative hematology ROS (+)   Anesthesia Other Findings Past Medical History: No date: Abdominal aortic atherosclerosis ([place])     Comment:  a. 05/2017 CTA abd/pelvis: significant atherosclerotic               dzs of the infrarenal abd Ao w/ some mural thrombus. No               aneurysm or dissection. No date: Acute focal ischemia of small intestine ([place]) 06/13/2017: Acute right-sided low back pain with right-sided sciatica 07/29/2017: AKI (acute kidney injury) ([place]) No date: Anemia No date: Anginal pain ([place])     Comment:  a. 08/2012 Lexiscan MV: EF 54%, non ischemia/infarct. No date: Anxiety and depression No date: Baker's cyst of knee, right     Comment:  a. 07/2016 U/S: 4.1 x 1.4 x 2.9 cystic structure in R               poplitetal fossa. No date: Bell's palsy No date: CAD (coronary artery disease) No date: CHF (congestive heart failure) ([place])     Comment:  a.) TTE 06/20/2017: EF 60-65%, no rwma, G1DD, no source               of cardiac emboli. No date: Chronic anticoagulation     Comment:   a.) apixaban No date: Chronic idiopathic constipation No date: Chronic mesenteric ischemia (HCC) No date: Chronic pain No date: COPD (chronic obstructive pulmonary disease) (HCC) No date: Dyspnea No date: 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[place])     Comment:  a. 05/2017 CTA Abd/pelvis: apparent thrombus or embolus               in prox SMA (70-90%); b. 05/2017 catheter directed tPA,               mechanical thrombectomy, and stenting of the SMA. 01/19/2016: Essential hypertension with goal blood pressure less than  140/90 01/19/2016: Gastroesophageal reflux disease No date: GERD (gastroesophageal reflux disease) No date: H/O colectomy 11/14/2019: History of 2019 novel coronavirus disease (COVID-19) No date: History of kidney stones No date: Hyperlipidemia No date: Hypertension No date: Morbid obesity with BMI of 40.0-44.9, adult ([place]) 06/27/2021: NSTEMI (non-ST elevated myocardial infarction) ([place])     Comment:  a.) high sensitivity troponins trended: 893 --> 1587 -->              1748 --> 868 --> 735 ng/L 06/19/2017: Occlusive mesenteric ischemia (HCC) No date: Osteoarthritis No date: PAD (peripheral artery disease) (HCC) No date: PAF (paroxysmal atrial fibrillation) (HCC)     Comment:  a.) CHA2DS2-VASc = 6 (sex, CHF,  HTN, DVT x2, aortic               plaque). b.) rate/rhythm maintained on oral metoprolol               tartrate; chronically anticoagulated with standard dose               apixaban. No date: Palpitations No date: Peripheral edema 06/01/2016: Personal history of other malignant neoplasm of skin No date: Pneumonia No date: Pre-diabetes 06/13/2017: Primary osteoarthritis of both knees 08/06/2017: SBO (small bowel obstruction) (HCC) No date: Skin cancer of nose No date: 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HCC) No date: Vitamin D deficiency  Past Surgical History: 12/09/2019: AMPUTATION TOE; Left     Comment:  Procedure: AMPUTATION TOE MPJ left;  Surgeon:  Caroline More, DPM;  Location: [facility] ORS;  Service: Podiatry;                Laterality: Left; No date: APPENDECTOMY 12/29/1827: APPLICATION OF WOUND VAC     Comment:  Procedure: APPLICATION OF WOUND VAC;  Surgeon: Algernon Huxley, MD;  Location: [facility] ORS;  Service: Vascular;; No date: CHOLECYSTECTOMY No date: COLON SURGERY 03/23/2022: COLONOSCOPY WITH PROPOFOL; N/A     Comment:  Procedure: COLONOSCOPY WITH PROPOFOL;  Surgeon: Jonathon Bellows, MD;  Location: [facility] ENDOSCOPY;  Service:               Gastroenterology;  Laterality: N/A;  rectal bleed 06/21/2020: EMBOLECTOMY OR THROMBECTOMY, WITH OR WITHOUT CATHETER;  FEMOROPOPLITEAL, AORTOILIAC ARTERY, BY LEG INCISION; N/A     Comment:  Left - Decomp Fasciotomy Leg; Ant &/Or Lat Compart Only;              Midline - Angiography, Visceral, Selective Or               Supraselective (With Or Without Flush Aortogram); Left -               Revascularize, Endovasc, Open/Percut, Iliac Artery,               Unilat, Initial Vessel; W/Translum Stent, W/Angioplasty;               Right - Revascularize, Endovasc, Open/Percut, Iliac               Artery, Ea Add`L Ipsilateral; W/Translumin Stent,               W/Angioplasty; Location [facility]; 12/01/2021: ENDARTERECTOMY FEMORAL; Left     Comment:  Procedure: ENDARTERECTOMY FEMORAL;  Surgeon: Algernon Huxley, MD;  Location: [facility] ORS;  Service: Vascular;                Laterality: Left; 12/01/2021: ENDOVASCULAR REPAIR/STENT GRAFT; Left     Comment:  Procedure: ENDOVASCULAR REPAIR/STENT GRAFT;  Surgeon:               Algernon Huxley, MD;  Location: [place] [facility];                Service: Cardiovascular;  Laterality: Left; 08/08/2017: LAPAROTOMY; N/A     Comment:  Procedure: EXPLORATORY LAPAROTOMY POSSIBLE BOWEL  RESECTION;  Surgeon: Jules Husbands, MD;  Location: [facility]               ORS;  Service: General;  Laterality: N/A; 05/17/2021: LEFT HEART CATH AND  CORONARY ANGIOGRAPHY; N/A     Comment:  Procedure: LEFT HEART CATH AND CORONARY ANGIOGRAPHY;                Surgeon: Yolonda Kida, MD;  Location: [place]              CV LAB;  Service: Cardiovascular;  Laterality: N/A; 11/17/2019: LOWER EXTREMITY ANGIOGRAPHY; Left     Comment:  Procedure: LOWER EXTREMITY ANGIOGRAPHY;  Surgeon: Algernon Huxley, MD;  Location: [place] [facility];  Service:               Cardiovascular;  Laterality: Left; 12/13/2020: LOWER EXTREMITY ANGIOGRAPHY; Right     Comment:  Procedure: LOWER EXTREMITY ANGIOGRAPHY;  Surgeon: Algernon Huxley, MD;  Location: [place] [facility];  Service:               Cardiovascular;  Laterality: Right; 01/10/2021: LOWER EXTREMITY ANGIOGRAPHY; Right     Comment:  Procedure: LOWER EXTREMITY ANGIOGRAPHY;  Surgeon: Algernon Huxley, MD;  Location: [place] [facility];  Service:               Cardiovascular;  Laterality: Right; 01/24/2021: LOWER EXTREMITY ANGIOGRAPHY; Right     Comment:  Procedure: LOWER EXTREMITY ANGIOGRAPHY;  Surgeon: Algernon Huxley, MD;  Location: [place] [facility];  Service:               Cardiovascular;  Laterality: Right; 06/28/2021: LOWER EXTREMITY ANGIOGRAPHY; Bilateral     Comment:  Procedure: Lower Extremity Angiography;  Surgeon: Algernon Huxley, MD;  Location: [place] [facility];  Service:               Cardiovascular;  Laterality: Bilateral; 11/28/2021: LOWER EXTREMITY ANGIOGRAPHY; Right     Comment:  Procedure: LOWER EXTREMITY ANGIOGRAPHY;  Surgeon: Algernon Huxley, MD;  Location: [place] [facility];  Service:               Cardiovascular;  Laterality: Right; 11/30/2021: LOWER EXTREMITY ANGIOGRAPHY; Left     Comment:  Procedure: Lower Extremity Angiography;  Surgeon: Algernon Huxley, MD;  Location: [place] [facility];  Service:               Cardiovascular;  Laterality: Left; 02/22/2022: LOWER EXTREMITY  ANGIOGRAPHY; Right     Comment:  Procedure: Lower Extremity Angiography;  Surgeon: Algernon Huxley, MD;  Location: [place] [facility];  Service:               Cardiovascular;  Laterality: Right; 03/13/2022: LOWER EXTREMITY ANGIOGRAPHY; Right     Comment:  Procedure: Lower  Extremity Angiography;  Surgeon: Algernon Huxley, MD;  Location: [place] [facility];  Service:               Cardiovascular;  Laterality: Right; 03/24/2022: LOWER EXTREMITY ANGIOGRAPHY; Right     Comment:  Procedure: Lower Extremity Angiography;  Surgeon: Algernon Huxley, MD;  Location: [place] [facility];  Service:               Cardiovascular;  Laterality: Right; 11/19/2019: LOWER EXTREMITY INTERVENTION; N/A     Comment:  Procedure: LOWER EXTREMITY INTERVENTION;  Surgeon: Algernon Huxley, MD;  Location: [place] [facility];  Service:               Cardiovascular;  Laterality: N/A; 06/29/2021: LOWER EXTREMITY INTERVENTION; Bilateral     Comment:  Procedure: LOWER EXTREMITY INTERVENTION;  Surgeon: Algernon Huxley, MD;  Location: [place] [facility];  Service:               Cardiovascular;  Laterality: Bilateral; 06/22/2017: TEE WITHOUT CARDIOVERSION; N/A     Comment:  Procedure: TRANSESOPHAGEAL ECHOCARDIOGRAM (TEE);                Surgeon: Wellington Hampshire, MD;  Location: [facility] ORS;                Service: Cardiovascular;  Laterality: N/A; 07/11/2018: TOTAL KNEE ARTHROPLASTY; Right     Comment:  Procedure: TOTAL KNEE ARTHROPLASTY;  Surgeon: Corky Mull, MD;  Location: [facility];  Service: Orthopedics;                Laterality: Right; No date: VAGINAL HYSTERECTOMY 06/20/2017: VISCERAL ARTERY INTERVENTION; N/A     Comment:  Procedure: Visceral Artery Intervention, possible aortic              thrombectomy;  Surgeon: Algernon Huxley, MD;  Location: [place] [facility];  Service: Cardiovascular;  Laterality:                N/A; 01/28/2018: VISCERAL ARTERY INTERVENTION; N/A     Comment:  Procedure: VISCERAL ARTERY INTERVENTION;  Surgeon: Algernon Huxley, MD;  Location: [place] [facility];  Service:               Cardiovascular;  Laterality: N/A;  BMI    Body Mass Index: 40.24 kg/m      Reproductive/Obstetrics negative OB ROS                             Anesthesia Physical Anesthesia Plan  ASA: 3  Anesthesia Plan: General ETT   Post-op Pain Management:    Induction: Intravenous  PONV Risk Score and Plan: Ondansetron, Dexamethasone, Midazolam and Treatment may vary due to age or medical condition  Airway Management Planned: Oral ETT  Additional Equipment:   Intra-op Plan:   Post-operative Plan: Extubation in OR  Informed Consent: I have reviewed the patients History and Physical, chart, labs and discussed the procedure including the risks, benefits and alternatives for the proposed anesthesia with the patient or authorized representative who has indicated his/her understanding and acceptance.     Dental Advisory Given  Plan Discussed with: Anesthesiologist, CRNA and Surgeon  Anesthesia Plan Comments: (Patient consented for risks of anesthesia including but not limited to:  - adverse reactions to medications - damage to eyes, teeth, lips or other oral mucosa - nerve damage due to positioning  - sore throat or hoarseness - Damage to heart, brain, nerves, lungs, other parts of body or loss of life  Patient voiced understanding.)        Anesthesia Quick Evaluation

## 2022-04-07 ENCOUNTER — Ambulatory Visit (INDEPENDENT_AMBULATORY_CARE_PROVIDER_SITE_OTHER): Payer: Medicare Other | Admitting: Vascular Surgery

## 2022-04-07 ENCOUNTER — Encounter: Payer: Self-pay | Admitting: Vascular Surgery

## 2022-04-07 LAB — BASIC METABOLIC PANEL
Anion gap: 7 (ref 5–15)
BUN: 12 mg/dL (ref 8–23)
CO2: 25 mmol/L (ref 22–32)
Calcium: 8.3 mg/dL — ABNORMAL LOW (ref 8.9–10.3)
Chloride: 109 mmol/L (ref 98–111)
Creatinine, Ser: 0.78 mg/dL (ref 0.44–1.00)
GFR, Estimated: >60 mL/min (ref >60)
Glucose, Bld: 118 mg/dL — ABNORMAL HIGH (ref 70–99)
Potassium: 3.6 mmol/L (ref 3.5–5.1)
Sodium: 141 mmol/L (ref 135–145)

## 2022-04-07 LAB — CBC
HCT: 27.8 % — ABNORMAL LOW (ref 36.0–46.0)
Hemoglobin: 8.6 g/dL — ABNORMAL LOW (ref 12.0–15.0)
MCH: 25.8 pg — ABNORMAL LOW (ref 26.0–34.0)
MCHC: 30.9 g/dL (ref 30.0–36.0)
MCV: 83.5 fL (ref 80.0–100.0)
Platelets: 259 10*3/uL (ref 150–400)
RBC: 3.33 MIL/uL — ABNORMAL LOW (ref 3.87–5.11)
RDW: 17.6 % — ABNORMAL HIGH (ref 11.5–15.5)
WBC: 9.3 10*3/uL (ref 4.0–10.5)
nRBC: 0 % (ref 0.0–0.2)

## 2022-04-07 MED ORDER — ONDANSETRON HCL 4 MG/2ML IJ SOLN: 4.0000 mg | Freq: Four times a day (QID) | INTRAMUSCULAR | Status: DC | PRN

## 2022-04-07 MED ORDER — DIPHENHYDRAMINE HCL 50 MG/ML IJ SOLN: 12.5000 mg | Freq: Four times a day (QID) | INTRAMUSCULAR | Status: DC | PRN

## 2022-04-07 MED ORDER — NALOXONE HCL 0.4 MG/ML IJ SOLN: 0.4000 mg | INTRAMUSCULAR | Status: DC | PRN

## 2022-04-07 MED ORDER — HYDROMORPHONE HCL 1 MG/ML IJ SOLN
1.0000 mg | INTRAMUSCULAR | Status: DC | PRN
  Administered 2022-04-07 (×2): 1 mg via INTRAVENOUS
  Filled 2022-04-07 (×2): qty 1

## 2022-04-07 MED ORDER — ENSURE ENLIVE PO LIQD
237.0000 mL | Freq: Three times a day (TID) | ORAL | Status: DC
  Administered 2022-04-07 – 2022-04-12 (×15): 237 mL via ORAL

## 2022-04-07 MED ORDER — HYDROMORPHONE 1 MG/ML IV SOLN
INTRAVENOUS | Status: DC
  Administered 2022-04-08: 1.8 mg via INTRAVENOUS
  Administered 2022-04-08: 2.1 mg via INTRAVENOUS
  Administered 2022-04-08: 0.6 mg via INTRAVENOUS
  Filled 2022-04-07 (×19): qty 30

## 2022-04-07 MED ORDER — DIPHENHYDRAMINE HCL 12.5 MG/5ML PO ELIX: 12.5000 mg | ORAL_SOLUTION | Freq: Four times a day (QID) | ORAL | Status: DC | PRN

## 2022-04-07 MED ORDER — SODIUM CHLORIDE 0.9% FLUSH: 9.0000 mL | INTRAVENOUS | Status: DC | PRN

## 2022-04-07 MED ORDER — ASCORBIC ACID 500 MG PO TABS
500.0000 mg | ORAL_TABLET | Freq: Two times a day (BID) | ORAL | Status: DC
  Administered 2022-04-07 – 2022-04-12 (×10): 500 mg via ORAL
  Filled 2022-04-07 (×10): qty 1

## 2022-04-07 NOTE — NC FL2 (Signed)
[place] LEVEL OF CARE SCREENING TOOL     IDENTIFICATION  Patient Name: Carla [name] Birthdate: 20-Aug-1961 Sex: female Admission Date (Current Location): 04/06/2022  [facility] and [place] Number:  Engineering geologist and Address:  [facility], [address] [place], [place] [address]      Provider Number: [id number]  Attending Physician Name and Address:  Algernon Huxley, MD  Relative Name and Phone Number:  Carla [name]- daughter- [phone]    Current Level of Care: Hospital Recommended Level of Care: [place] Prior Approval Number:    Date Approved/Denied:   PASRR Number: [id number]  Discharge Plan: SNF    Current Diagnoses: Patient Active Problem List   Diagnosis Date Noted   Atherosclerosis of artery of extremity with rest pain ([place]) 04/06/2022   Rectal bleeding 03/21/2022   Hypokalemia 02/03/2022   Chronic anticoagulation    Anxiety and depression    Palpitations    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[place]) 12/03/2021   Critical limb ischemia of left lower extremity ([place]) 11/30/2021   CAD (coronary artery disease) 07/07/2021   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[place]) 06/27/2021   Ischemic pain of right foot 06/27/2021   Arterial occlusion    PAD (peripheral artery disease) ([place]) 05/03/2021   Ischemia of extremity 01/24/2021   Tobacco abuse counseling 10/26/2020   Smoker 10/26/2020   Chronic diastolic CHF (congestive heart failure) ([place]) 10/06/2020   Neuropathic pain 08/19/2020   Bursitis of right shoulder 12/01/2019   Chronic pain 12/01/2019   Primary osteoarthritis involving multiple joints 12/01/2019   Ischemia of left lower extremity 11/17/2019   Carpal tunnel syndrome, right 11/10/2019   Health care maintenance 03/26/2019   Disorder of skeletal system 03/26/2019   Problems influencing health status 03/26/2019   Chronic low back pain (Secondary area of Pain) (Bilateral) w/o sciatica 03/26/2019   Rib pain on left side  03/25/2019   Aortic atherosclerosis ([place]) 09/12/2018   COPD (chronic obstructive pulmonary disease) ([place]) 09/12/2018   Moderate episode of recurrent major depressive disorder ([place]) 09/12/2018   Lumbar spondylosis 09/06/2018   History of total knee replacement (Right) 07/11/2018   Ventral hernia with obstruction and without gangrene 07/02/2018   PAF (paroxysmal atrial fibrillation) ([place]) 06/15/2018   Accelerated hypertension 04/08/2018   Acute encephalopathy 04/08/2018   DDD (degenerative disc disease), lumbosacral 09/25/2017   Osteoarthritis of knee 09/25/2017   Adjustment disorder with anxiety 08/15/2017   Chronic idiopathic constipation    Protein-calorie malnutrition, severe 08/08/2017   SBO (small bowel obstruction) ([place]) 08/06/2017   Chronic mesenteric ischemia (HCC)    AKI (acute kidney injury) ([place]) 07/29/2017   Hypotension 07/29/2017   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HCC)    Abdominal pain    Occlusive mesenteric ischemia ([place]) 06/19/2017   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HCC)    Acute focal ischemia of small intestine ([place])    Acute right-sided low back pain with right-sided sciatica 06/13/2017   Osteoarthritis of knee (Bilateral) 06/13/2017   Chronic pain syndrome 09/13/2016   Morbid obesity with BMI of 40.0-44.9, adult ([place]) 07/16/2016   Personal history of other malignant neoplasm of skin 06/01/2016   Chronic knee pain (Primary Area of Pain) (Bilateral) 01/19/2016   Essential hypertension with goal blood pressure less than 140/90 01/19/2016   Gastroesophageal reflux disease 01/19/2016   Hyperlipidemia 01/19/2016   Prediabetes 01/19/2016   Recurrent major depressive disorder ([place]) 01/19/2016   Urinary tract infection 01/09/2014   Chronic pelvic pain in female 11/14/2013  Mixed stress and urge urinary incontinence 11/14/2013   Chest pain 08/06/2012    Orientation RESPIRATION BLADDER Height & Weight     Self, Time, Situation, Place  Normal External  catheter, Continent Weight: 99.8 kg Height:  '5\' 2"'$  (157.5 cm)  BEHAVIORAL SYMPTOMS/MOOD NEUROLOGICAL BOWEL NUTRITION STATUS      Continent Diet (Regular)  AMBULATORY STATUS COMMUNICATION OF NEEDS Skin   Extensive Assist Verbally Surgical wounds (Right leg amputation incision)                       Personal Care Assistance Level of Assistance  Bathing, Feeding, Dressing Bathing Assistance: Limited assistance Feeding assistance: Limited assistance Dressing Assistance: Maximum assistance     Functional Limitations Info  Sight, Hearing, Speech Sight Info: Adequate Hearing Info: Adequate Speech Info: Adequate    SPECIAL CARE FACTORS FREQUENCY  PT (By licensed PT), OT (By licensed OT)     PT Frequency: 5 times per week OT Frequency: 5 times per week            Contractures Contractures Info: Not present    Additional Factors Info  Code Status, Allergies Code Status Info: Full Allergies Info: Gabapentin, bactrim           Current Medications (04/07/2022):  This is the current hospital active medication list Current Facility-Administered Medications  Medication Dose Route Frequency Provider Last Rate Last Admin   0.9 %  sodium chloride infusion   Intravenous Continuous Algernon Huxley, MD 75 mL/hr at 04/07/22 1350 Infusion Verify at 04/07/22 1350   alum & mag hydroxide-simeth (MAALOX/MYLANTA) 200-200-20 MG/5ML suspension 15-30 mL  15-30 mL Oral Q2H PRN Algernon Huxley, MD       amLODipine (NORVASC) tablet 5 mg  5 mg Oral Daily Algernon Huxley, MD   5 mg at 04/07/22 1019   apixaban (ELIQUIS) tablet 5 mg  5 mg Oral BID Algernon Huxley, MD   5 mg at 04/07/22 1019   ascorbic acid (VITAMIN C) tablet 500 mg  500 mg Oral BID Algernon Huxley, MD       aspirin EC tablet 81 mg  81 mg Oral Daily Algernon Huxley, MD   81 mg at 04/07/22 1018   atorvastatin (LIPITOR) tablet 80 mg  80 mg Oral QHS Algernon Huxley, MD       citalopram (CELEXA) tablet 40 mg  40 mg Oral Daily Algernon Huxley, MD   40 mg at  04/07/22 1018   docusate sodium (COLACE) capsule 100 mg  100 mg Oral Daily Algernon Huxley, MD   100 mg at 04/07/22 1019   famotidine (PEPCID) IVPB 20 mg premix  20 mg Intravenous Q12H Algernon Huxley, MD   Stopped at 04/07/22 1053   feeding supplement (ENSURE ENLIVE / ENSURE PLUS) liquid 237 mL  237 mL Oral TID BM Algernon Huxley, MD       ferrous sulfate tablet 325 mg  325 mg Oral Daily Algernon Huxley, MD   325 mg at 04/07/22 1018   furosemide (LASIX) tablet 40 mg  40 mg Oral Daily Algernon Huxley, MD   40 mg at 04/07/22 1019   guaiFENesin-dextromethorphan (ROBITUSSIN DM) 100-10 MG/5ML syrup 15 mL  15 mL Oral Q4H PRN Algernon Huxley, MD       hydrALAZINE (APRESOLINE) injection 5 mg  5 mg Intravenous Q20 Min PRN Algernon Huxley, MD       ipratropium-albuterol (DUONEB) 0.5-2.5 (3)  MG/3ML nebulizer solution 3 mL  3 mL Inhalation QID Algernon Huxley, MD   3 mL at 04/07/22 1145   isosorbide mononitrate (IMDUR) 24 hr tablet 60 mg  60 mg Oral Daily Algernon Huxley, MD   60 mg at 04/07/22 1018   ketorolac (TORADOL) 30 MG/ML injection 30 mg  30 mg Intravenous Q6H PRN Algernon Huxley, MD   30 mg at 04/06/22 2347   labetalol (NORMODYNE) injection 10 mg  10 mg Intravenous Q10 min PRN Algernon Huxley, MD       magnesium sulfate IVPB 2 g 50 mL  2 g Intravenous Daily PRN Algernon Huxley, MD       metoprolol tartrate (LOPRESSOR) injection 2-5 mg  2-5 mg Intravenous Q2H PRN Algernon Huxley, MD       metoprolol tartrate (LOPRESSOR) tablet 50 mg  50 mg Oral BID Algernon Huxley, MD   50 mg at 04/07/22 1018   morphine (PF) 2 MG/ML injection 2 mg  2 mg Intravenous Q1H PRN Algernon Huxley, MD   2 mg at 04/07/22 1411   multivitamin with minerals tablet 1 tablet  1 tablet Oral Daily Algernon Huxley, MD   1 tablet at 04/07/22 1019   nitroGLYCERIN (NITROSTAT) SL tablet 0.4 mg  0.4 mg Sublingual UD Dew, Erskine Squibb, MD       ondansetron [facility]) injection 4 mg  4 mg Intravenous Q6H PRN Algernon Huxley, MD   4 mg at 04/06/22 1815   oxyCODONE-acetaminophen (PERCOCET/ROXICET)  5-325 MG per tablet 1-2 tablet  1-2 tablet Oral Q6H PRN Algernon Huxley, MD   2 tablet at 04/07/22 1018   pantoprazole (PROTONIX) EC tablet 40 mg  40 mg Oral Daily Algernon Huxley, MD   40 mg at 04/07/22 1018   phenol (CHLORASEPTIC) mouth spray 1 spray  1 spray Mouth/Throat PRN Dew, Erskine Squibb, MD       polyethylene glycol (MIRALAX / GLYCOLAX) packet 17 g  17 g Oral Daily PRN Algernon Huxley, MD       potassium chloride SA (KLOR-CON M) CR tablet 20-40 mEq  20-40 mEq Oral Daily PRN Algernon Huxley, MD       pregabalin (LYRICA) capsule 100 mg  100 mg Oral TID Algernon Huxley, MD   100 mg at 04/07/22 1018   sorbitol 70 % solution 30 mL  30 mL Oral Daily PRN Algernon Huxley, MD       traMADol Veatrice [place]) tablet 50 mg  50 mg Oral Q6H PRN Algernon Huxley, MD         Discharge Medications: Please see discharge summary for a list of discharge medications.  Relevant Imaging Results:  Relevant Lab Results:   Additional Information SS#- [id number]  Shelbie Hutching, RN

## 2022-04-07 NOTE — TOC Initial Note (Addendum)
Transition of Care [facility]) - Initial/Assessment Note    Patient Details  Name: Carla [name] MRN: [id number] Date of Birth: 08/04/1961  Transition of [facility] CM/SW Contact:    Shelbie Hutching, RN Phone Number: 04/07/2022, 3:10 PM  Clinical Narrative:                 Patient is status post right above the knee amputation.  RNCM called to speak with patient via phone, she reports being in significant pain.  RNCM did let the patient's RN and MD know about the patient's pain and reports that the medication is not working.   Patient is from home with her daughter, [name], and son in Sports coach.  Daughter provides transportation.  Patient is current with PCP at [facility] she also gets prescriptions filled there or at [facility] [place].   Talked with patient about recommendations for short term rehab.  Patient reports that she wants to go home.  RNCM called and spoke with daughter, [name] believes that patient should go to rehab for a couple of weeks before returning home.  [name] reports that the patient and her talked about this already.  She will be going up to the hospital today and talking again with the patient.  RNCM will start SNF workup.    If patient decides to decline SNF and go home with home health she will need a wheelchair, tub bench, and 3 in 1- confirmed with daughter that she does not have these items at home.     Expected Discharge Plan: Skilled Nursing Facility Barriers to Discharge: Continued Medical Work up   Patient Goals and CMS Choice Patient states their goals for this hospitalization and ongoing recovery are:: patient wants to go home- daughter wants patient to go to rehab CMS Medicare.gov Compare Post Acute Care list provided to:: Patient Choice offered to / list presented to : Patient, Adult Children  Expected Discharge Plan and Services Expected Discharge Plan: [place]   Discharge Planning Services: CM Consult Post Acute Care Choice:  [place] Living arrangements for the past 2 months: Single Family Home                                      Prior Living Arrangements/Services Living arrangements for the past 2 months: Single Family Home Lives with:: Adult Children Patient language and need for interpreter reviewed:: Yes Do you feel safe going back to the place where you live?: Yes      Need for Family Participation in Patient Care: Yes (Comment) Care giver support system in place?: Yes (comment) Current home services: DME (walker) Criminal Activity/Legal Involvement Pertinent to Current Situation/Hospitalization: No - Comment as needed  Activities of Daily Living      Permission Sought/Granted Permission sought to share information with : Case Manager, Family Supports, Customer service manager Permission granted to share information with : Yes, Verbal Permission Granted  Share Information with NAME: Kelsy Polack  Permission granted to share info w AGENCY: SNFs  Permission granted to share info w Relationship: daughter  Permission granted to share info w Contact Information: [phone]  Emotional Assessment   Attitude/Demeanor/Rapport: Grieving Affect (typically observed): Depressed, Grieving, Tearful/Crying Orientation: : Oriented to Self, Oriented to Place, Oriented to Situation, Oriented to  Time Alcohol / Substance Use: Not Applicable Psych Involvement: No (comment)  Admission diagnosis:  Atherosclerosis of artery of extremity  with rest pain [facility]) [I70.229] Patient Active Problem List   Diagnosis Date Noted   Atherosclerosis of artery of extremity with rest pain ([place]) 04/06/2022   Rectal bleeding 03/21/2022   Hypokalemia 02/03/2022   Chronic anticoagulation    Anxiety and depression    Palpitations    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[place]) 12/03/2021   Critical limb ischemia of left lower extremity ([place]) 11/30/2021   CAD (coronary artery disease) 07/07/2021   Arterial stent  thrombosis ([place]) 06/27/2021   Ischemic pain of right foot 06/27/2021   Arterial occlusion    PAD (peripheral artery disease) ([place]) 05/03/2021   Ischemia of extremity 01/24/2021   Tobacco abuse counseling 10/26/2020   Smoker 10/26/2020   Chronic diastolic CHF (congestive heart failure) ([place]) 10/06/2020   Neuropathic pain 08/19/2020   Bursitis of right shoulder 12/01/2019   Chronic pain 12/01/2019   Primary osteoarthritis involving multiple joints 12/01/2019   Ischemia of left lower extremity 11/17/2019   Carpal tunnel syndrome, right 11/10/2019   Health care maintenance 03/26/2019   Disorder of skeletal system 03/26/2019   Problems influencing health status 03/26/2019   Chronic low back pain (Secondary area of Pain) (Bilateral) w/o sciatica 03/26/2019   Rib pain on left side 03/25/2019   Aortic atherosclerosis ([place]) 09/12/2018   COPD (chronic obstructive pulmonary disease) ([place]) 09/12/2018   Moderate episode of recurrent major depressive disorder ([place]) 09/12/2018   Lumbar spondylosis 09/06/2018   History of total knee replacement (Right) 07/11/2018   Ventral hernia with obstruction and without gangrene 07/02/2018   PAF (paroxysmal atrial fibrillation) ([place]) 06/15/2018   Accelerated hypertension 04/08/2018   Acute encephalopathy 04/08/2018   DDD (degenerative disc disease), lumbosacral 09/25/2017   Osteoarthritis of knee 09/25/2017   Adjustment disorder with anxiety 08/15/2017   Chronic idiopathic constipation    Protein-calorie malnutrition, severe 08/08/2017   SBO (small bowel obstruction) ([place]) 08/06/2017   Chronic mesenteric ischemia (HCC)    AKI (acute kidney injury) ([place]) 07/29/2017   Hypotension 07/29/2017   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HCC)    Abdominal pain    Occlusive mesenteric ischemia ([place]) 06/19/2017   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HCC)    Acute focal ischemia of small intestine ([place])    Acute right-sided low back pain with right-sided  sciatica 06/13/2017   Osteoarthritis of knee (Bilateral) 06/13/2017   Chronic pain syndrome 09/13/2016   Morbid obesity with BMI of 40.0-44.9, adult ([place]) 07/16/2016   Personal history of other malignant neoplasm of skin 06/01/2016   Chronic knee pain (Primary Area of Pain) (Bilateral) 01/19/2016   Essential hypertension with goal blood pressure less than 140/90 01/19/2016   Gastroesophageal reflux disease 01/19/2016   Hyperlipidemia 01/19/2016   Prediabetes 01/19/2016   Recurrent major depressive disorder ([place]) 01/19/2016   Urinary tract infection 01/09/2014   Chronic pelvic pain in female 11/14/2013   Mixed stress and urge urinary incontinence 11/14/2013   Chest pain 08/06/2012   PCP:  Kirk Ruths, MD Pharmacy:   [facility] [address]), [place] [address] [place]) [place] [address] Phone: [phone] Fax: [phone]     Social Determinants of Health (SDOH) Interventions    Readmission Risk Interventions    03/24/2022    9:51 AM 12/02/2021   11:23 AM  Readmission Risk Prevention Plan  Post Dischage Appt  Complete  Medication Screening  Complete  Transportation Screening Complete Complete  PCP or Specialist Appt within 3-5 Days Complete   HRI or [place]  Complete   Social Work Consult for [place] Planning/Counseling Complete   Palliative Care Screening Not Applicable   Medication Review Press photographer) Complete

## 2022-04-07 NOTE — Evaluation (Addendum)
Physical Therapy Evaluation Patient Details Name: Carla [name] MRN: [id number] DOB: 03/14/1961 Today's Date: 04/07/2022  History of Present Illness  Carla [name] is a 61 y.o. female who is submitted for pre-surgical anesthesia review and clearance prior to her undergoing the above procedure. Patient is a Current Smoker. [name] includes: CAD, NSTEMI, angina, CHF, PAF, PAD, aortic atherosclerosis, palpitations, SMA thrombosis/embolus, chronic mesenteric ischemia, HTN, HLD, prediabetes, COPD, DOE, GERD (on daily PPI), anemia, peripheral edema, OA, chronic pain syndrome, anxiety, depression.  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 Pt in 7/ 10 pain and very emotional due to pain and recent multiple deaths in family. Pt is motivated and pleasant. Pt able to perform bed mobility with max of 1 and sat on the EOB for 10 mins with BUE support. Pt performed AROM to LLE on EOB and in bed and bridges in supine. Pt educated to perform ankle pumps, bridges and heel slides to LLE in bed every 3 hours. Pt positioned with pillow to prevent tightening of the R hip joint. Nursing made aware of the positioning and pain levels. Pt refused to perform STS at bedside. Pt fatigues easily. Pt will benefit form SNF to achieve PLOF beyond acute care.      Recommendations for follow up therapy are one component of a multi-disciplinary discharge planning process, led by the attending physician.  Recommendations may be updated based on patient status, additional functional criteria and insurance authorization.  Follow Up Recommendations Skilled nursing-short term rehab (<3 hours/day)    Assistance Recommended at Discharge Frequent or constant Supervision/Assistance  Patient can return home with the following  Two people to help with walking and/or transfers;A lot of help with bathing/dressing/bathroom;Assistance with cooking/housework;Direct supervision/assist for medications management;Direct  supervision/assist for financial management;Help with stairs or ramp for entrance;Assist for transportation    Equipment Recommendations Rolling walker (2 wheels);BSC/3in1;Wheelchair (measurements PT);Wheelchair cushion (measurements PT)  Recommendations for Other Services       Functional Status Assessment Patient has had a recent decline in their functional status and demonstrates the ability to make significant improvements in function in a reasonable and predictable amount of time.     Precautions / Restrictions Precautions Precautions: Fall Restrictions Weight Bearing Restrictions: No      Mobility  Bed Mobility Overal bed mobility: Needs Assistance Bed Mobility: Rolling, Supine to Sit, Sit to Supine Rolling: Max assist   Supine to sit: Max assist, HOB elevated Sit to supine: Max assist, HOB elevated        Transfers Overall transfer level: Needs assistance                 General transfer comment: unbale to assess due to pain.    Ambulation/Gait Ambulation/Gait assistance:  (unable)                Stairs            Wheelchair Mobility    Modified Rankin (Stroke Patients Only)       Balance Overall balance assessment: Needs assistance Sitting-balance support: Bilateral upper extremity supported Sitting balance-[name]: Fair                                       Pertinent Vitals/Pain Pain Assessment Pain Assessment: 0-10 Pain Score: 7  Pain Location: RLE. Pain Descriptors / Indicators: Sharp, Shooting, Burning, Numbness, Throbbing Pain Intervention(s): Patient requesting pain meds-RN notified (pain  meds prior to evaluation)    Home Living Family/patient expects to be discharged to:: Private residence Living Arrangements: Children;Other relatives Available Help at Discharge: Family;Available 24 hours/day Type of Home: Mobile home Home Access: Stairs to enter Entrance Stairs-Rails: Can reach both Entrance  Stairs-Number of Steps: 5   Home Layout: One level Home Equipment: Wheelchair - manual;Cane - single point;Rollator (4 wheels)      Prior Function Prior Level of Function : Independent/Modified Independent;Needs assist;History of Falls (last six months)             Mobility Comments: Amb with RW. ADLs Comments: Independent ADLs. Does not participate with cooking/cleaning/housework.     Hand Dominance   Dominant Hand: Right    Extremity/Trunk Assessment   Upper Extremity Assessment Upper Extremity Assessment: Generalized weakness    Lower Extremity Assessment Lower Extremity Assessment: RLE deficits/detail;LLE deficits/detail RLE: Unable to fully assess due to pain RLE Sensation: decreased light touch LLE Sensation: WNL LLE Coordination: WNL       Communication   Communication: No difficulties  Cognition Arousal/Alertness: Awake/alert Behavior During Therapy: WFL for tasks assessed/performed (sad) Overall Cognitive Status: Within Functional Limits for tasks assessed                                          General Comments      Exercises General Exercises - Lower Extremity Ankle Circles/Pumps: 20 reps, Seated, Supine Quad Sets: 10 reps, Supine (bridges 10 reps) Gluteal Sets: 10 reps, Supine Long Arc Quad: AROM, 10 reps, Seated Heel Slides: AROM, 10 reps, Supine Amputee Exercises Hip Extension: Strengthening, 10 reps, Supine   Assessment/Plan    PT Assessment Patient needs continued PT services  PT Problem List Decreased strength;Decreased range of motion;Decreased activity tolerance;Decreased balance;Decreased mobility;Impaired sensation;Obesity;Decreased skin integrity;Pain       PT Treatment Interventions Gait training;Functional mobility training;Therapeutic activities;Therapeutic exercise;Balance training;Neuromuscular re-education;Patient/family education;Wheelchair mobility training;Modalities;Manual techniques    PT Goals  (Current goals can be found in the Care Plan section)  Acute Rehab PT Goals Patient Stated Goal: "I want to get better." PT Goal Formulation: With patient Time For Goal Achievement: 04/21/22 Potential to Achieve Goals: Good    Frequency Min 2X/week     Co-evaluation               AM-PAC PT "6 Clicks" Mobility  Outcome Measure Help needed turning from your back to your side while in a flat bed without using bedrails?: A Lot Help needed moving from lying on your back to sitting on the side of a flat bed without using bedrails?: A Lot Help needed moving to and from a bed to a chair (including a wheelchair)?: A Lot Help needed standing up from a chair using your arms (e.g., wheelchair or bedside chair)?: Total Help needed to walk in hospital room?: Total   6 Click Score: 8    End of Session Equipment Utilized During Treatment: Gait belt Activity Tolerance: Patient tolerated treatment well;Patient limited by pain Patient left: in bed;with bed alarm set Nurse Communication: Mobility status;Patient requests pain meds PT Visit Diagnosis: Muscle weakness (generalized) (M62.81);Difficulty in walking, not elsewhere classified (R26.2);Pain Pain - Right/Left: Right Pain - part of body: Leg    Time: 5366-4403 PT Time Calculation (min) (ACUTE ONLY): 29 min   Charges:   PT Evaluation $PT Eval Moderate Complexity: 1 Mod PT Treatments $Therapeutic Exercise: 8-22 mins $Therapeutic  Activity: 8-22 mins       Joaquin Music PT DPT 11:59 AM,04/07/22

## 2022-04-07 NOTE — Evaluation (Signed)
Occupational Therapy Evaluation Patient Details Name: Carla [name] MRN: [id number] DOB: Dec 02, 1960 Today's Date: 04/07/2022   History of Present Illness Carla [name] is a 61 y.o. female who is submitted for pre-surgical anesthesia review and clearance prior to her undergoing the above procedure. Patient is a Current Smoker. [name] includes: CAD, NSTEMI, angina, CHF, PAF, PAD, aortic atherosclerosis, palpitations, SMA thrombosis/embolus, chronic mesenteric ischemia, HTN, HLD, prediabetes, COPD, DOE, GERD (on daily PPI), anemia, peripheral edema, OA, chronic pain syndrome, anxiety, depression.   Clinical Impression   Pt seen for OT evaluation this date, POD#1 from above surgery. Pt was modified independent in all ADL prior to surgery, requiring assist from family for IADL. Pt is eager to return to PLOF with less pain and improved safety and independence. Pt currently requires MOD A for bed mobility and MAX A for LB ADL tasks from bed or EOB level, significantly limited by pain. Pt instructed in desensitization strategies for management of residual limb. Pt would benefit from skilled OT services including additional instruction in dressing techniques with or without assistive devices for dressing and bathing skills to support recall and carryover prior to discharge and ultimately to maximize safety, independence, and minimize falls risk and caregiver burden. Recommending SNF to support return to baseline function prior to amputation.     Recommendations for follow up therapy are one component of a multi-disciplinary discharge planning process, led by the attending physician.  Recommendations may be updated based on patient status, additional functional criteria and insurance authorization.   Follow Up Recommendations  Skilled nursing-short term rehab (<3 hours/day)    Assistance Recommended at Discharge Intermittent Supervision/Assistance  Patient can return home with the following A lot of  help with walking and/or transfers;Assistance with cooking/housework;Assist for transportation;Help with stairs or ramp for entrance;Direct supervision/assist for medications management    Functional Status Assessment  Patient has had a recent decline in their functional status and demonstrates the ability to make significant improvements in function in a reasonable and predictable amount of time.  Equipment Recommendations  BSC/3in1;Tub/shower bench;Wheelchair (measurements OT);Wheelchair cushion (measurements OT)    Recommendations for Other Services       Precautions / Restrictions Precautions Precautions: Fall Restrictions Weight Bearing Restrictions: Yes RLE Weight Bearing: Non weight bearing Other Position/Activity Restrictions: new R AKA      Mobility Bed Mobility Overal bed mobility: Needs Assistance Bed Mobility: Supine to Sit, Sit to Supine     Supine to sit: Mod assist, HOB elevated Sit to supine: Min assist, HOB elevated   General bed mobility comments: pain limited    Transfers                          Balance Overall balance assessment: Needs assistance Sitting-balance support: Bilateral upper extremity supported Sitting balance-[name]: Poor Sitting balance - Comments: pain limited                                   ADL either performed or assessed with clinical judgement   ADL                                         General ADL Comments: Pt currently requires MAX A for LB ADL tasks, unable to attempt ADL transfers     Vision  Perception     Praxis      Pertinent Vitals/Pain Pain Assessment Pain Assessment: 0-10 Pain Score: 10-Worst pain ever Pain Location: RLE. Pain Descriptors / Indicators: Guarding, Grimacing, Moaning, Crying Pain Intervention(s): Limited activity within patient's tolerance, Monitored during session, Premedicated before session, Repositioned, Patient requesting pain  meds-RN notified     Hand Dominance Right   Extremity/Trunk Assessment Upper Extremity Assessment Upper Extremity Assessment: Generalized weakness   Lower Extremity Assessment Lower Extremity Assessment: Generalized weakness;RLE deficits/detail RLE Deficits / Details: s/p AKA RLE: Unable to fully assess due to pain RLE Sensation: decreased light touch LLE Sensation: WNL LLE Coordination: WNL       Communication Communication Communication: No difficulties   Cognition Arousal/Alertness: Awake/alert Behavior During Therapy: WFL for tasks assessed/performed Overall Cognitive Status: Within Functional Limits for tasks assessed                                       General Comments       Exercises Other Exercises Other Exercises: Pt instructed in desensitization strategies to support residual limb recovery   Shoulder Instructions      Home Living Family/patient expects to be discharged to:: Private residence Living Arrangements: Children;Other relatives Available Help at Discharge: Family;Available 24 hours/day Type of Home: Mobile home Home Access: Stairs to enter Entrance Stairs-Number of Steps: 5 Entrance Stairs-Rails: Can reach both Home Layout: One level     Bathroom Shower/Tub: Teacher, early years/pre: Standard Bathroom Accessibility: No   Home Equipment: Cane - single point;Rollator (4 wheels);Rolling Walker (2 wheels)   Additional Comments: has a w/c but it's a family member's not hers and she isn't sure where it is      Prior Functioning/Environment Prior Level of Function : Independent/Modified Independent;Needs assist;History of Falls (last six months)             Mobility Comments: Amb with RW. ADLs Comments: Independent ADLs, seated baths. Does not participate with cooking/cleaning/housework.        OT Problem List: Decreased strength;Pain;Impaired balance (sitting and/or standing);Decreased knowledge of use of  DME or AE;Decreased knowledge of precautions      OT Treatment/Interventions: Self-care/ADL training;Therapeutic exercise;Therapeutic activities;DME and/or AE instruction;Patient/family education;Balance training    OT Goals(Current goals can be found in the care plan section) Acute Rehab OT Goals Patient Stated Goal: have less pain OT Goal Formulation: With patient Time For Goal Achievement: 04/21/22 Potential to Achieve Goals: Good ADL Goals Pt Will Perform Lower Body Dressing: with supervision;with set-up;sitting/lateral leans Pt Will Transfer to Toilet: with mod assist;bedside commode;stand pivot transfer Additional ADL Goal #1: Pt will demonstrate independence with residual limb desensitization strategies. Additional ADL Goal #2: Pt will complete bed mobility with supervision in anticipation of EOB ADL.  OT Frequency: Min 2X/week    Co-evaluation              AM-PAC OT "6 Clicks" Daily Activity     Outcome Measure Help from another person eating meals?: None Help from another person taking care of personal grooming?: None Help from another person toileting, which includes using toliet, bedpan, or urinal?: A Lot Help from another person bathing (including washing, rinsing, drying)?: A Lot Help from another person to put on and taking off regular upper body clothing?: A Little Help from another person to put on and taking off regular lower body clothing?: A Lot 6 Click Score: 17  End of Session Nurse Communication: Mobility status;Patient requests pain meds  Activity Tolerance: Patient limited by pain Patient left: in bed;with call bell/phone within reach;with bed alarm set;Other (comment) (RT)  OT Visit Diagnosis: Other abnormalities of gait and mobility (R26.89);Repeated falls (R29.6);Muscle weakness (generalized) (M62.81);Pain Pain - Right/Left: Right Pain - part of body: Leg                Time: 5217-4715 OT Time Calculation (min): 13 min Charges:  OT General  Charges $OT Visit: 1 Visit OT Evaluation $OT Eval Moderate Complexity: 1 Mod OT Treatments $Therapeutic Activity: 8-22 mins  Ardeth Perfect., MPH, MS, OTR/L ascom [phone] 04/07/22, 12:52 PM

## 2022-04-07 NOTE — Progress Notes (Signed)
[facility]  Daily Progress Note   Subjective  -   Complaining of a lot of pain.  Did work with therapy.  Objective Vitals:   04/07/22 0811 04/07/22 1148 04/07/22 1601 04/07/22 1601  BP: (!) 153/71   (!) 128/59  Pulse: 73   69  Resp: 18   18  Temp: 99.2 F (37.3 C)   98.2 F (36.8 C)  TempSrc:      SpO2: 93% 93% 93% 99%  Weight:      Height:        Intake/Output Summary (Last 24 hours) at 04/07/2022 1641 Last data filed at 04/07/2022 1350 Gross per 24 hour  Intake 1724.03 ml  Output 1900 ml  Net -175.97 ml    PULM  CTAB CV  RRR VASC  dressing is clean, dry, and intact  Laboratory CBC    Component Value Date/Time   WBC 9.3 04/07/2022 0528   HGB 8.6 (L) 04/07/2022 0528   HGB 13.4 02/12/2015 1137   HCT 27.8 (L) 04/07/2022 0528   HCT 40.9 02/12/2015 1137   PLT 259 04/07/2022 0528   PLT 272 02/12/2015 1137    BMET    Component Value Date/Time   NA 141 04/07/2022 0528   NA 140 02/12/2015 1137   K 3.6 04/07/2022 0528   K 2.9 (L) 02/12/2015 1137   CL 109 04/07/2022 0528   CL 107 02/12/2015 1137   CO2 25 04/07/2022 0528   CO2 27 02/12/2015 1137   GLUCOSE 118 (H) 04/07/2022 0528   GLUCOSE 120 (H) 02/12/2015 1137   BUN 12 04/07/2022 0528   BUN 15 02/12/2015 1137   CREATININE 0.78 04/07/2022 0528   CREATININE 0.78 02/12/2015 1137   CALCIUM 8.3 (L) 04/07/2022 0528   CALCIUM 9.0 02/12/2015 1137   GFRNONAA >60 04/07/2022 0528   GFRNONAA >60 02/12/2015 1137   GFRAA >60 11/23/2019 1330   GFRAA >60 02/12/2015 1137    Assessment/Planning: POD #1 s/p right AKA  Doing reasonably well.  Needs increased pain management at this point so I have changed morphine to Dilaudid Did okay with PT but will require skilled nursing facility at discharge per therapy recommendations which I would agree with Have discussed this with the patient and she is considering her options but sounds amenable to it at this point Remove OR dressing  Sunday PT/OT  [name]  04/07/2022, 4:41 PM

## 2022-04-07 NOTE — Plan of Care (Signed)
  Problem: Education: Goal: Knowledge of General Education information will improve Description: Including pain rating scale, medication(s)/side effects and non-pharmacologic comfort measures Outcome: Progressing   Problem: Clinical Measurements: Goal: Cardiovascular complication will be avoided Outcome: Progressing   Problem: Nutrition: Goal: Adequate nutrition will be maintained Outcome: Progressing   Problem: Safety: Goal: Ability to remain free from injury will improve Outcome: Progressing

## 2022-04-07 NOTE — Progress Notes (Signed)
   04/07/22 1235  Clinical Encounter Type  Visited With Patient  Visit Type Initial;Spiritual support;Social support  Referral From Patient;Nurse  Consult/Referral To Chaplain  Spiritual Encounters  Spiritual Needs Emotional;Grief support   Chaplain Leiam Hopwood offered compassionate, non-anxious presence to pt who is coping with new amputation. Pt also coping with extensive losses including recent deaths of spouse and recent news of sister with stage IV cancer dx.  Chaplain B offered active listening and allowing pt to verbalize her grief. Chaplain B invited pt to consider the lifesaving nature of what she has endured. Chaplain encouraged pt to also understand next phases of healing as additional forms of grief and loss and together we named and considered ways of coping, especially staying grounded in the moment.  Chaplain B offered prayer with pt; visit was appreciated. Will recommend for ongoing support.

## 2022-04-08 LAB — BASIC METABOLIC PANEL
Anion gap: 4 — ABNORMAL LOW (ref 5–15)
BUN: 14 mg/dL (ref 8–23)
CO2: 27 mmol/L (ref 22–32)
Calcium: 8.2 mg/dL — ABNORMAL LOW (ref 8.9–10.3)
Chloride: 112 mmol/L — ABNORMAL HIGH (ref 98–111)
Creatinine, Ser: 0.74 mg/dL (ref 0.44–1.00)
GFR, Estimated: >60 mL/min (ref >60)
Glucose, Bld: 103 mg/dL — ABNORMAL HIGH (ref 70–99)
Potassium: 3.4 mmol/L — ABNORMAL LOW (ref 3.5–5.1)
Sodium: 143 mmol/L (ref 135–145)

## 2022-04-08 LAB — CBC
HCT: 23.7 % — ABNORMAL LOW (ref 36.0–46.0)
Hemoglobin: 7.1 g/dL — ABNORMAL LOW (ref 12.0–15.0)
MCH: 25.3 pg — ABNORMAL LOW (ref 26.0–34.0)
MCHC: 30 g/dL (ref 30.0–36.0)
MCV: 84.3 fL (ref 80.0–100.0)
Platelets: 201 10*3/uL (ref 150–400)
RBC: 2.81 MIL/uL — ABNORMAL LOW (ref 3.87–5.11)
RDW: 18.4 % — ABNORMAL HIGH (ref 11.5–15.5)
WBC: 6.9 10*3/uL (ref 4.0–10.5)
nRBC: 0 % (ref 0.0–0.2)

## 2022-04-08 LAB — HEMOGLOBIN AND HEMATOCRIT, BLOOD
HCT: 29.2 % — ABNORMAL LOW (ref 36.0–46.0)
Hemoglobin: 8.9 g/dL — ABNORMAL LOW (ref 12.0–15.0)

## 2022-04-08 LAB — PREPARE RBC (CROSSMATCH)

## 2022-04-08 MED ORDER — ACETAMINOPHEN 325 MG PO TABS
650.0000 mg | ORAL_TABLET | Freq: Once | ORAL | Status: AC
Start: 2022-04-08 — End: 2022-04-08
  Administered 2022-04-08: 650 mg via ORAL
  Filled 2022-04-08: qty 2

## 2022-04-08 MED ORDER — SODIUM CHLORIDE 0.9 % IV SOLN: INTRAVENOUS | Status: DC | PRN

## 2022-04-08 MED ORDER — SODIUM CHLORIDE 0.9% IV SOLUTION: Freq: Once | INTRAVENOUS | Status: AC

## 2022-04-08 MED ORDER — ZINC SULFATE 220 (50 ZN) MG PO CAPS
220.0000 mg | ORAL_CAPSULE | Freq: Every day | ORAL | Status: DC
  Administered 2022-04-08 – 2022-04-12 (×5): 220 mg via ORAL
  Filled 2022-04-08 (×5): qty 1

## 2022-04-08 MED ORDER — IPRATROPIUM-ALBUTEROL 0.5-2.5 (3) MG/3ML IN SOLN
3.0000 mL | RESPIRATORY_TRACT | Status: DC | PRN
Start: 2022-04-08 — End: 2022-04-12

## 2022-04-08 NOTE — Progress Notes (Signed)
2 Days Post-Op   Subjective/Chief Complaint: Pain better controlled with PCA.   Objective: Vital signs in last 24 hours: Temp:  [97.9 F (36.6 C)-99.2 F (37.3 C)] 97.9 F (36.6 C) (06/17 0305) Pulse Rate:  [64-79] 64 (06/17 0305) Resp:  [14-20] 14 (06/17 0305) BP: (94-153)/(50-71) 94/50 (06/17 0305) SpO2:  [93 %-99 %] 93 % (06/17 0305) FiO2 (%):  [0 %] 0 % (06/17 0305) Last BM Date : 04/05/22  Intake/Output from previous day: 06/16 0701 - 06/17 0700 In: 798.9 [I.V.:698.9; IV Piggyback:100] Out: 1500 [Urine:700] Intake/Output this shift: No intake/output data recorded.  General appearance: alert and no distress Cardio: regular rate and rhythm Extremities: RIGHT AKA dressing- C/D/I, thigh soft  Lab Results:  Recent Labs    04/07/22 0528 04/08/22 0510  WBC 9.3 6.9  HGB 8.6* 7.1*  HCT 27.8* 23.7*  PLT 259 201   BMET Recent Labs    04/07/22 0528 04/08/22 0510  NA 141 143  K 3.6 3.4*  CL 109 112*  CO2 25 27  GLUCOSE 118* 103*  BUN 12 14  CREATININE 0.78 0.74  CALCIUM 8.3* 8.2*   PT/INR No results for input(s): "LABPROT", "INR" in the last 72 hours. ABG No results for input(s): "PHART", "HCO3" in the last 72 hours.  Invalid input(s): "PCO2", "PO2"  Studies/Results: No results found.  Anti-infectives: Anti-infectives (From admission, onward)    Start     Dose/Rate Route Frequency Ordered Stop   04/07/22 0600  ceFAZolin (ANCEF) IVPB 2g/100 mL premix        2 g 200 mL/hr over 30 Minutes Intravenous On call to O.R. 04/06/22 1250 04/06/22 1439   04/06/22 1330  ceFAZolin (ANCEF) 2-4 GM/100ML-% IVPB       Note to Pharmacy: Maryagnes Amos B: cabinet override      04/06/22 1330 04/06/22 1432       Assessment/Plan: s/p Procedure(s): AMPUTATION ABOVE KNEE (Right) POD #2 Will transfuse 1UpRBC Continue PCA for now for pain Replete K- 3.4  LOS: 2 days    Jamesetta So A 04/08/2022

## 2022-04-08 NOTE — Anesthesia Postprocedure Evaluation (Signed)
Anesthesia Post Note  Patient: Carla [name]  Procedure(s) Performed: AMPUTATION ABOVE KNEE (Right: Knee)  Patient location during evaluation: PACU Anesthesia Type: General Level of consciousness: awake and alert Pain management: pain level controlled Vital Signs Assessment: post-procedure vital signs reviewed and stable Respiratory status: spontaneous breathing, nonlabored ventilation, respiratory function stable and patient connected to nasal cannula oxygen Cardiovascular status: blood pressure returned to baseline and stable Postop Assessment: no apparent nausea or vomiting Anesthetic complications: no   No notable events documented.   Last Vitals:  Vitals:   04/08/22 0210 04/08/22 0305  BP: (!) 102/58 (!) 94/50  Pulse: 69 64  Resp: 16 14  Temp: 37.1 C 36.6 C  SpO2: 93% 93%    Last Pain:  Vitals:   04/08/22 0305  TempSrc: Oral  PainSc: Asleep                 [name]

## 2022-04-08 NOTE — Progress Notes (Signed)
Initial Nutrition Assessment  DOCUMENTATION CODES:   Morbid obesity  INTERVENTION:   -Continue Ensure Enlive po TID, each supplement provides 350 kcal and 20 grams of protein -MVI with minerals daily -500 mg vitamin C BID -220 mg zinc sulfate daily x 14 days  NUTRITION DIAGNOSIS:   Increased nutrient needs related to post-op healing as evidenced by estimated needs.  GOAL:   Patient will meet greater than or equal to 90% of their needs  MONITOR:   PO intake, Supplement acceptance  REASON FOR ASSESSMENT:   Malnutrition Screening Tool    ASSESSMENT:   Pt with PMH of CAD, NSTEMI, angina, CHF, PAF, PAD, aortic atherosclerosis, palpitations, SMA thrombosis/embolus, chronic mesenteric ischemia, HTN, HLD, prediabetes, COPD, DOE, GERD (on daily PPI), anemia, peripheral edema, OA, chronic pain syndrome, anxiety, depression. Admitted with rt leg pain with irreversible and non-reconstructable vascular disease.  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 Attempted to speak with pt via call to hospital room phone, however, unable to reach. RD unable to obtain further nutrition-related history or complete nutrition-focused physical exam at this time.    Pt currently on a regular diet. No meal completion data available to assess at this time.   Per previous RD notes, pt prefers chocolate Ensure.  Reviewed wt hx;pt has experienced a 2.3% wt loss over the past month, which is not significant for time frame.   Medications reviewed and include colace, lasix, and dilaudid.   Labs reviewed: K: 3.4, CBGS: 95.    Diet Order:   Diet Order             Diet regular Room service appropriate? Yes; Fluid consistency: Thin  Diet effective now                   EDUCATION NEEDS:   No education needs have been identified at this time  Skin:  Skin Assessment: Skin Integrity Issues: Skin  Integrity Issues:: Incisions Incisions: closed rt thigh  Last BM:  04/05/22  Height:   Ht Readings from Last 1 Encounters:  04/06/22 '5\' 2"'$  (1.575 m)    Weight:   Wt Readings from Last 1 Encounters:  04/06/22 99.8 kg    Ideal Body Weight:  46 kg (adjusted for rt AKA)  BMI:  Body mass index is 40.24 kg/m.  Estimated Nutritional Needs:   Kcal:  6222-9798  Protein:  85-100 grams  Fluid:  > 1.7 L    Loistine Chance, RD, LDN, [place] Registered Dietitian II Certified Diabetes Care and Education Specialist Please refer to [facility] for RD and/or RD on-call/weekend/after hours pager

## 2022-04-08 NOTE — Progress Notes (Signed)
Assisted patient to sit on side of bed, tolerated well and was able to sit for about ten minutes.  Following this, patient stood at bedside with 1 person assistance and walker; was able to shuffle sideways up toward head of bed before getting back into bed.  Patient tolerated well, though with increased pain.  Encouraged to utilize PCA post mobility with good relief.

## 2022-04-08 NOTE — Plan of Care (Signed)
  Problem: Activity: Goal: Ability to perform//tolerate increased activity and mobilize with assistive devices will improve Outcome: Progressing   Problem: Pain Management: Goal: Pain level will decrease with appropriate interventions Outcome: Progressing   Problem: Clinical Measurements: Goal: Ability to maintain clinical measurements within normal limits will improve Outcome: Progressing

## 2022-04-08 NOTE — Progress Notes (Signed)
  Chaplain On-Call received a call from [employer] who reported that the patient is having many concerns following a right leg above the knee amputation.  Chaplain spoke briefly with the patient, who requested a later visit after she receives a blood transfusion.  Chaplain assured her of making a return visit.  Chaplain Pollyann Samples  M.Div., [facility]

## 2022-04-09 LAB — COMPREHENSIVE METABOLIC PANEL
ALT: 13 U/L (ref 0–44)
AST: 34 U/L (ref 15–41)
Albumin: 2.6 g/dL — ABNORMAL LOW (ref 3.5–5.0)
Alkaline Phosphatase: 74 U/L (ref 38–126)
Anion gap: 9 (ref 5–15)
BUN: 18 mg/dL (ref 8–23)
CO2: 28 mmol/L (ref 22–32)
Calcium: 8.3 mg/dL — ABNORMAL LOW (ref 8.9–10.3)
Chloride: 104 mmol/L (ref 98–111)
Creatinine, Ser: 0.83 mg/dL (ref 0.44–1.00)
GFR, Estimated: >60 mL/min (ref >60)
Glucose, Bld: 133 mg/dL — ABNORMAL HIGH (ref 70–99)
Potassium: 3.7 mmol/L (ref 3.5–5.1)
Sodium: 141 mmol/L (ref 135–145)
Total Bilirubin: 0.6 mg/dL (ref 0.3–1.2)
Total Protein: 5.8 g/dL — ABNORMAL LOW (ref 6.5–8.1)

## 2022-04-09 LAB — BPAM RBC
Blood Product Expiration Date: 202306252359
ISSUE DATE / TIME: 202306171050
Unit Type and Rh: 600

## 2022-04-09 LAB — TYPE AND SCREEN
ABO/RH(D): A POS
Antibody Screen: NEGATIVE
Unit division: 0

## 2022-04-09 LAB — CBC
HCT: 29.2 % — ABNORMAL LOW (ref 36.0–46.0)
Hemoglobin: 9 g/dL — ABNORMAL LOW (ref 12.0–15.0)
MCH: 26.1 pg (ref 26.0–34.0)
MCHC: 30.8 g/dL (ref 30.0–36.0)
MCV: 84.6 fL (ref 80.0–100.0)
Platelets: 239 10*3/uL (ref 150–400)
RBC: 3.45 MIL/uL — ABNORMAL LOW (ref 3.87–5.11)
RDW: 18.5 % — ABNORMAL HIGH (ref 11.5–15.5)
WBC: 8.6 10*3/uL (ref 4.0–10.5)
nRBC: 0 % (ref 0.0–0.2)

## 2022-04-09 NOTE — TOC Progression Note (Signed)
Transition of Care [facility]) - Progression Note    Patient Details  Name: Carla [name] MRN: [id number] Date of Birth: 07/04/1961  Transition of [facility]) CM/SW Contact  Kehinde Totzke, [place], [place] Phone Number: 04/09/2022, 11:18 AM  Clinical Narrative:    Met with patient at bedside to review bed offers. Patient chooses bed offer from [facility]. This social worker started insurance authorization through [facility] Ref# [id number] to start 04/10/22. Clinicals faxed [phone].  [address] LCSW Transition of Care [phone]     Expected Discharge Plan: [place] Barriers to Discharge: Continued Medical Work up  Expected Discharge Plan and Services Expected Discharge Plan: [place]   Discharge Planning Services: CM Consult Post Acute Care Choice: [place] Living arrangements for the past 2 months: Single Family Home                                       Social Determinants of Health (SDOH) Interventions    Readmission Risk Interventions    03/24/2022    9:51 AM 12/02/2021   11:23 AM  Readmission Risk Prevention Plan  Post Dischage Appt  Complete  Medication Screening  Complete  Transportation Screening Complete Complete  PCP or Specialist Appt within 3-5 Days Complete   HRI or [place] Complete   Social Work Consult for [place] Planning/Counseling Complete   Palliative Care Screening Not Applicable   Medication Review Press photographer) Complete

## 2022-04-09 NOTE — Plan of Care (Signed)
Pt AAOx4, moderate to severe pain, currently on PCA pump. VS are stable. Plan for pain control. Bed is in lowest position, call light within reach. Will continue to monitor.

## 2022-04-09 NOTE — Progress Notes (Signed)
3 Days Post-Op   Subjective/Chief Complaint: Complains of pain in RIGHT stump, controlled with PCA   Objective: Vital signs in last 24 hours: Temp:  [98 F (36.7 C)-99.2 F (37.3 C)] 99.2 F (37.3 C) (06/18 0750) Pulse Rate:  [61-74] 72 (06/18 0750) Resp:  [12-20] 16 (06/18 0530) BP: (112-127)/(55-97) 121/63 (06/18 0750) SpO2:  [92 %-96 %] 94 % (06/18 0750) FiO2 (%):  [0 %] 0 % (06/17 1913) Last BM Date : 04/05/22  Intake/Output from previous day: 06/17 0701 - 06/18 0700 In: 2065.3 [I.V.:1584.5; Blood:332; IV Piggyback:148.7] Out: 1700 [Urine:1700] Intake/Output this shift: No intake/output data recorded.  General appearance: alert and mild distress Resp: clear to auscultation bilaterally Cardio: regular rate and rhythm Extremities: RIGHT AKA dressing changed- Incision- C/D/I, Medial margin slightly dusky, appropriately tender,soft, viable  Lab Results:  Recent Labs    04/07/22 0528 04/08/22 0510 04/08/22 1511  WBC 9.3 6.9  --   HGB 8.6* 7.1* 8.9*  HCT 27.8* 23.7* 29.2*  PLT 259 201  --    BMET Recent Labs    04/07/22 0528 04/08/22 0510  NA 141 143  K 3.6 3.4*  CL 109 112*  CO2 25 27  GLUCOSE 118* 103*  BUN 12 14  CREATININE 0.78 0.74  CALCIUM 8.3* 8.2*   PT/INR No results for input(s): "LABPROT", "INR" in the last 72 hours. ABG No results for input(s): "PHART", "HCO3" in the last 72 hours.  Invalid input(s): "PCO2", "PO2"  Studies/Results: No results found.  Anti-infectives: Anti-infectives (From admission, onward)    Start     Dose/Rate Route Frequency Ordered Stop   04/07/22 0600  ceFAZolin (ANCEF) IVPB 2g/100 mL premix        2 g 200 mL/hr over 30 Minutes Intravenous On call to O.R. 04/06/22 1250 04/06/22 1439   04/06/22 1330  ceFAZolin (ANCEF) 2-4 GM/100ML-% IVPB       Note to Pharmacy: Maryagnes Amos B: cabinet override      04/06/22 1330 04/06/22 1432       Assessment/Plan: s/p Procedure(s): AMPUTATION ABOVE KNEE (Right) POD  #3 OOB with PT Will transition to PO regimen for pain tomorrow Rehab planning LAbs pending-  LOS: 3 days    Jamesetta So A 04/09/2022

## 2022-04-10 LAB — SURGICAL PATHOLOGY

## 2022-04-10 MED ORDER — HYDROMORPHONE HCL 1 MG/ML IJ SOLN
0.5000 mg | INTRAMUSCULAR | Status: DC | PRN
  Administered 2022-04-10 – 2022-04-11 (×4): 0.5 mg via INTRAVENOUS
  Filled 2022-04-10 (×4): qty 0.5

## 2022-04-10 NOTE — Progress Notes (Signed)
Occupational Therapy Treatment Patient Details Name: Carla [name] MRN: [id number] DOB: [birth year] Today's Date: 04/10/2022   History of present illness Carla [name] is a 61 y.o. female who is submitted for pre-surgical anesthesia review and clearance prior to her undergoing the above procedure. Patient is a Current Smoker. [name] includes: CAD, NSTEMI, angina, CHF, PAF, PAD, aortic atherosclerosis, palpitations, SMA thrombosis/embolus, chronic mesenteric ischemia, HTN, HLD, prediabetes, COPD, DOE, GERD (on daily PPI), anemia, peripheral edema, OA, chronic pain syndrome, anxiety, depression.   OT comments  Pt seen for co-tx with PT for session focused on ADL transfer training. Pt endorsing 8/10 pain at rest. RN notified and provided pt with IV pain medication at start of session. With max encouragement and MAX A pt completed bed mobility. Pt tearful throughout 2/2 pain, active listening and emotional support provided. 2 attempts to stand with MAX VC for hand/foot placement and MAX A +2 to complete on 2nd attempt but pt unable to stop leaning against EOB with LLE. SpO2 >93% on room air. Pt instructed in cognitive behavioral pain coping strategies and reviewed desensitization strategies to support pain mgt. Pt progressing, however continues to require significant skilled OT services to maximize safety/indep.    Recommendations for follow up therapy are one component of a multi-disciplinary discharge planning process, led by the attending physician.  Recommendations may be updated based on patient status, additional functional criteria and insurance authorization.    Follow Up Recommendations  Skilled nursing-short term rehab (<3 hours/day)    Assistance Recommended at Discharge Intermittent Supervision/Assistance  Patient can return home with the following  Two people to help with walking and/or transfers;A lot of help with bathing/dressing/bathroom;Assistance with cooking/housework;Help with  stairs or ramp for entrance;Assist for transportation;Direct supervision/assist for medications management   Equipment Recommendations  BSC/3in1;Tub/shower bench;Wheelchair (measurements OT);Wheelchair cushion (measurements OT)    Recommendations for Other Services      Precautions / Restrictions Precautions Precautions: Fall Restrictions Weight Bearing Restrictions: Yes RLE Weight Bearing: Non weight bearing Other Position/Activity Restrictions: new R AKA       Mobility Bed Mobility Overal bed mobility: Needs Assistance Bed Mobility: Supine to Sit     Supine to sit: Max assist, HOB elevated     General bed mobility comments: cues for technique and sequencing. rest breaks required due to pain and increased time/effort needed    Transfers Overall transfer level: Needs assistance Equipment used: Rolling walker (2 wheels) Transfers: Sit to/from Stand Sit to Stand: Max assist, +2 physical assistance, From elevated surface           General transfer comment: sit to stand performed x 1 bout with verbal cues for hand placement and technique. faciliation for lift off and anterior translation.     Balance Overall balance assessment: Needs assistance Sitting-balance support: Bilateral upper extremity supported Sitting balance-[name]: Fair Sitting balance - Comments: pain limited   Standing balance support: Bilateral upper extremity supported, Reliant on assistive device for balance Standing balance-[name]: Zero Standing balance comment: maximal external support required and patient relying heavily on bed for support while standing.                           ADL either performed or assessed with clinical judgement   ADL  General ADL Comments: Pt continues to require MAX A for LB ADL tasks from bed level due to pain, did attempt transfers with OT/PT this date    Extremity/Trunk Assessment               Vision       Perception     Praxis      Cognition Arousal/Alertness: Awake/alert Behavior During Therapy: WFL for tasks assessed/performed Overall Cognitive Status: Within Functional Limits for tasks assessed                                 General Comments: tearful with  mobility due to pain        Exercises Other Exercises Other Exercises: Pt re-eduated in desensitization strategies to support residual limb recovery and pain mgt, pt did not recall from 3 days ago    Shoulder Instructions       General Comments      Pertinent Vitals/ Pain       Pain Assessment Pain Assessment: Faces Faces Pain Scale: Hurts whole lot Pain Location: RLE Pain Descriptors / Indicators: Guarding, Grimacing, Crying Pain Intervention(s): Limited activity within patient's tolerance, Monitored during session, Premedicated before session, Repositioned, RN gave pain meds during session  Home Living                                          Prior Functioning/Environment              Frequency  Min 2X/week        Progress Toward Goals  OT Goals(current goals can now be found in the care plan section)  Progress towards OT goals: Progressing toward goals  Acute Rehab OT Goals Patient Stated Goal: have less pain OT Goal Formulation: With patient Time For Goal Achievement: 04/21/22 Potential to Achieve Goals: Good  Plan Discharge plan remains appropriate;Frequency remains appropriate    Co-evaluation    PT/OT/SLP Co-Evaluation/Treatment: Yes Reason for Co-Treatment: To address functional/ADL transfers;For patient/therapist safety PT goals addressed during session: Mobility/safety with mobility OT goals addressed during session: ADL's and self-care      AM-PAC OT "6 Clicks" Daily Activity     Outcome Measure   Help from another person eating meals?: None Help from another person taking care of personal grooming?: None Help  from another person toileting, which includes using toliet, bedpan, or urinal?: Total Help from another person bathing (including washing, rinsing, drying)?: A Lot Help from another person to put on and taking off regular upper body clothing?: A Little Help from another person to put on and taking off regular lower body clothing?: A Lot 6 Click Score: 16    End of Session Equipment Utilized During Treatment: Rolling walker (2 wheels)  OT Visit Diagnosis: Other abnormalities of gait and mobility (R26.89);Repeated falls (R29.6);Muscle weakness (generalized) (M62.81);Pain Pain - Right/Left: Right Pain - part of body: Leg   Activity Tolerance Patient limited by pain   Patient Left in bed;with call bell/phone within reach (seated EOB with PT)   Nurse Communication Mobility status;Patient requests pain meds        Time: 7564-3329 OT Time Calculation (min): 25 min  Charges: OT General Charges $OT Visit: 1 Visit OT Treatments $Self Care/Home Management : 8-22 mins  Ardeth Perfect., MPH, MS, OTR/L ascom [phone] 04/10/22, 1:37 PM

## 2022-04-10 NOTE — Plan of Care (Signed)
  Problem: Education: Goal: Knowledge of the prescribed therapeutic regimen will improve Outcome: Progressing Goal: Ability to verbalize activity precautions or restrictions will improve Outcome: Progressing Goal: Understanding of discharge needs will improve Outcome: Progressing   

## 2022-04-10 NOTE — Plan of Care (Signed)
Pt AAOx4, moderate pain. VS are stable. Plan for bowel movement, increase mobility and pain control. Bed is in lowest position, call light within reach. Will continue to monitor.

## 2022-04-10 NOTE — Progress Notes (Signed)
Physical Therapy Treatment Patient Details Name: Carla [name] MRN: [id number] DOB: 11-24-1960 Today's Date: 04/10/2022   History of Present Illness Carla [name] is a 61 y.o. female who is submitted for pre-surgical anesthesia review and clearance prior to her undergoing the above procedure. Patient is a Current Smoker. [name] includes: CAD, NSTEMI, angina, CHF, PAF, PAD, aortic atherosclerosis, palpitations, SMA thrombosis/embolus, chronic mesenteric ischemia, HTN, HLD, prediabetes, COPD, DOE, GERD (on daily PPI), anemia, peripheral edema, OA, chronic pain syndrome, anxiety, depression.   PT Comments    Patient agreeable to mobility with encouragement and was pre-medicated for pain. The patient continues to require assistance with functional mobility. She was able to stand x 1 bout with maximal assistance +2 person using rolling walker. Activity tolerance also limited by fatigue. The patient is tearful at times and needs reassurance throughout. Recommend to continue PT to maximize independence and decrease caregiver burden. SNF is recommended at discharge.    Recommendations for follow up therapy are one component of a multi-disciplinary discharge planning process, led by the attending physician.  Recommendations may be updated based on patient status, additional functional criteria and insurance authorization.  Follow Up Recommendations  Skilled nursing-short term rehab (<3 hours/day)     Assistance Recommended at Discharge Frequent or constant Supervision/Assistance  Patient can return home with the following Two people to help with walking and/or transfers;A lot of help with bathing/dressing/bathroom;Assistance with cooking/housework;Direct supervision/assist for medications management;Direct supervision/assist for financial management;Help with stairs or ramp for entrance;Assist for transportation   Equipment Recommendations  Rolling walker (2 wheels);BSC/3in1;Wheelchair  (measurements PT);Wheelchair cushion (measurements PT)    Recommendations for Other Services       Precautions / Restrictions Precautions Precautions: Fall Restrictions Weight Bearing Restrictions: Yes RLE Weight Bearing: Non weight bearing     Mobility  Bed Mobility Overal bed mobility: Needs Assistance Bed Mobility: Supine to Sit, Sit to Supine     Supine to sit: Max assist, HOB elevated Sit to supine: Max assist, HOB elevated   General bed mobility comments: cues for technique and sequencing. rest breaks required due to pain and increased time/effort needed    Transfers Overall transfer level: Needs assistance Equipment used: Rolling walker (2 wheels) Transfers: Sit to/from Stand Sit to Stand: Max assist, +2 physical assistance, From elevated surface           General transfer comment: sit to stand performed x 1 bout with verbal cues for hand placement and technique. faciliation for lift off and anterior translation.    Ambulation/Gait               General Gait Details: unable to attempt to due to poor standing tolerance   Stairs             Wheelchair Mobility    Modified Rankin (Stroke Patients Only)       Balance Overall balance assessment: Needs assistance Sitting-balance support: Bilateral upper extremity supported Sitting balance-[name]: Fair     Standing balance support: Bilateral upper extremity supported, Reliant on assistive device for balance Standing balance-[name]: Zero Standing balance comment: maximal external support required and patient relying heavily on bed for support while standing.                            Cognition Arousal/Alertness: Awake/alert Behavior During Therapy: WFL for tasks assessed/performed Overall Cognitive Status: Within Functional Limits for tasks assessed  General Comments: patient tearful at times and needs encouragement to  participate with PT. she is able to follow single step commands consistently with extra time        Exercises      General Comments        Pertinent Vitals/Pain Pain Assessment Pain Assessment: Faces Faces Pain Scale: Hurts whole lot Pain Location: RLE Pain Descriptors / Indicators: Guarding, Grimacing Pain Intervention(s): Limited activity within patient's tolerance, Premedicated before session, Repositioned, Monitored during session    Home Living                          Prior Function            PT Goals (current goals can now be found in the care plan section) Acute Rehab PT Goals Patient Stated Goal: to walk PT Goal Formulation: With patient Time For Goal Achievement: 04/21/22 Potential to Achieve Goals: Good Progress towards PT goals: Progressing toward goals    Frequency    Min 2X/week      PT Plan Current plan remains appropriate    Co-evaluation PT/OT/SLP Co-Evaluation/Treatment: Yes Reason for Co-Treatment: To address functional/ADL transfers (partial co-treatment performed) PT goals addressed during session: Mobility/safety with mobility        AM-PAC PT "6 Clicks" Mobility   Outcome Measure  Help needed turning from your back to your side while in a flat bed without using bedrails?: A Lot Help needed moving from lying on your back to sitting on the side of a flat bed without using bedrails?: A Lot Help needed moving to and from a bed to a chair (including a wheelchair)?: A Lot Help needed standing up from a chair using your arms (e.g., wheelchair or bedside chair)?: Total Help needed to walk in hospital room?: Total Help needed climbing 3-5 steps with a railing? : Total 6 Click Score: 9    End of Session   Activity Tolerance: Patient limited by pain;Patient limited by fatigue Patient left: in bed;with call bell/phone within reach;with bed alarm set;with nursing/sitter in room (nurse techs present to assist with bath) Nurse  Communication: Mobility status PT Visit Diagnosis: Muscle weakness (generalized) (M62.81);Difficulty in walking, not elsewhere classified (R26.2);Pain Pain - Right/Left: Right Pain - part of body: Leg     Time: 4081-4481 PT Time Calculation (min) (ACUTE ONLY): 28 min  Charges:  $Therapeutic Activity: 23-37 mins                     Minna Merritts, PT, MPT    Percell Locus 04/10/2022, 12:37 PM

## 2022-04-10 NOTE — TOC Progression Note (Signed)
Transition of Care [facility]) - Progression Note    Patient Details  Name: Carla [name] MRN: [id number] Date of Birth: Oct 16, 1961  Transition of Care [facility]) CM/SW Contact  Beverly Sessions, RN Phone Number: 04/10/2022, 10:22 AM  Clinical Narrative:    SNF auth pending    Expected Discharge Plan: [place] Barriers to Discharge: Continued Medical Work up  Expected Discharge Plan and Services Expected Discharge Plan: [place]   Discharge Planning Services: CM Consult Post Acute Care Choice: [place] Living arrangements for the past 2 months: Single Family Home                                       Social Determinants of Health (SDOH) Interventions    Readmission Risk Interventions    03/24/2022    9:51 AM 12/02/2021   11:23 AM  Readmission Risk Prevention Plan  Post Dischage Appt  Complete  Medication Screening  Complete  Transportation Screening Complete Complete  PCP or Specialist Appt within 3-5 Days Complete   HRI or [place] Complete   Social Work Consult for [place] Planning/Counseling Complete   Palliative Care Screening Not Applicable   Medication Review Press photographer) Complete

## 2022-04-10 NOTE — Progress Notes (Signed)
[facility]  Daily Progress Note   Subjective  -   No events. Still having pain.   Objective Vitals:   04/10/22 0411 04/10/22 0700 04/10/22 0914 04/10/22 1531  BP: (!) 101/51 126/67  (!) 88/51  Pulse: 62 63  61  Resp: '16 18 13 18  '$ Temp: 98.2 F (36.8 C) 98 F (36.7 C)  98.2 F (36.8 C)  TempSrc: Oral Oral  Oral  SpO2: 92% 96% 96% 94%  Weight:      Height:        Intake/Output Summary (Last 24 hours) at 04/10/2022 1808 Last data filed at 04/10/2022 1500 Gross per 24 hour  Intake 652.74 ml  Output 1700 ml  Net -1047.26 ml    PULM  CTAB CV  RRR VASC  Dressing C/D/I  Laboratory CBC    Component Value Date/Time   WBC 8.6 04/09/2022 1103   HGB 9.0 (L) 04/09/2022 1103   HGB 13.4 02/12/2015 1137   HCT 29.2 (L) 04/09/2022 1103   HCT 40.9 02/12/2015 1137   PLT 239 04/09/2022 1103   PLT 272 02/12/2015 1137    BMET    Component Value Date/Time   NA 141 04/09/2022 1103   NA 140 02/12/2015 1137   K 3.7 04/09/2022 1103   K 2.9 (L) 02/12/2015 1137   CL 104 04/09/2022 1103   CL 107 02/12/2015 1137   CO2 28 04/09/2022 1103   CO2 27 02/12/2015 1137   GLUCOSE 133 (H) 04/09/2022 1103   GLUCOSE 120 (H) 02/12/2015 1137   BUN 18 04/09/2022 1103   BUN 15 02/12/2015 1137   CREATININE 0.83 04/09/2022 1103   CREATININE 0.78 02/12/2015 1137   CALCIUM 8.3 (L) 04/09/2022 1103   CALCIUM 9.0 02/12/2015 1137   GFRNONAA >60 04/09/2022 1103   GFRNONAA >60 02/12/2015 1137   GFRAA >60 11/23/2019 1330   GFRAA >60 02/12/2015 1137    Assessment/Planning: POD #4 s/p right AKA  Pain control has been an issue. She has been on meds for quite some time Continue PT/OT Needs rehab at discharged   [facility]  04/10/2022, 6:08 PM

## 2022-04-10 NOTE — TOC Progression Note (Signed)
Transition of Care [facility]) - Progression Note    Patient Details  Name: Carla [name] MRN: [id number] Date of Birth: [birth year]  Transition of Care [facility]) CM/SW Contact  Beverly Sessions, RN Phone Number: 04/10/2022, 3:07 PM  Clinical Narrative:    SNF auth pending      Expected Discharge Plan: [place] Barriers to Discharge: Continued Medical Work up  Expected Discharge Plan and Services Expected Discharge Plan: [place]   Discharge Planning Services: CM Consult Post Acute Care Choice: [place] Living arrangements for the past 2 months: Single Family Home                                       Social Determinants of Health (SDOH) Interventions    Readmission Risk Interventions    03/24/2022    9:51 AM 12/02/2021   11:23 AM  Readmission Risk Prevention Plan  Post Dischage Appt  Complete  Medication Screening  Complete  Transportation Screening Complete Complete  PCP or Specialist Appt within 3-5 Days Complete   HRI or [place] Complete   Social Work Consult for [place] Planning/Counseling Complete   Palliative Care Screening Not Applicable   Medication Review Press photographer) Complete

## 2022-04-11 ENCOUNTER — Encounter (INDEPENDENT_AMBULATORY_CARE_PROVIDER_SITE_OTHER): Payer: Medicare Other

## 2022-04-11 ENCOUNTER — Ambulatory Visit (INDEPENDENT_AMBULATORY_CARE_PROVIDER_SITE_OTHER): Payer: Medicare Other | Admitting: Vascular Surgery

## 2022-04-11 MED ORDER — OXYCODONE-ACETAMINOPHEN 5-325 MG PO TABS
1.0000 | ORAL_TABLET | Freq: Four times a day (QID) | ORAL | Status: DC | PRN
  Administered 2022-04-11 – 2022-04-12 (×3): 2 via ORAL
  Filled 2022-04-11 (×3): qty 2

## 2022-04-11 MED ORDER — METHOCARBAMOL 500 MG PO TABS
500.0000 mg | ORAL_TABLET | Freq: Three times a day (TID) | ORAL | Status: DC | PRN
  Administered 2022-04-11: 500 mg via ORAL
  Filled 2022-04-11: qty 1

## 2022-04-11 MED ORDER — METHOCARBAMOL 1000 MG/10ML IJ SOLN: 500.0000 mg | Freq: Three times a day (TID) | INTRAVENOUS | Status: DC | PRN

## 2022-04-11 NOTE — Plan of Care (Signed)
Pt AAOx4, reports severe pain in R AKA site this morning. Plan for pain control and mobility as tolerated. Bed is in lowest position, call light within reach. Will continue to monitor.

## 2022-04-11 NOTE — Progress Notes (Signed)
[facility]  Daily Progress Note   Subjective  -   Still complaining of a lot of pain.  No other major issues  Objective Vitals:   04/10/22 1531 04/10/22 1948 04/11/22 0436 04/11/22 0813  BP: (!) 88/51 97/67 (!) 122/52 124/74  Pulse: 61 70 66 64  Resp: '18 16 16 19  '$ Temp: 98.2 F (36.8 C) 98.4 F (36.9 C) 98 F (36.7 C) 98.2 F (36.8 C)  TempSrc: Oral Oral Oral Oral  SpO2: 94% 97% 96% 95%  Weight:      Height:        Intake/Output Summary (Last 24 hours) at 04/11/2022 1309 Last data filed at 04/11/2022 0439 Gross per 24 hour  Intake 480 ml  Output 2200 ml  Net -1720 ml    PULM  CTAB CV  RRR VASC  stump is clean, dry, and intact.  Laboratory CBC    Component Value Date/Time   WBC 8.6 04/09/2022 1103   HGB 9.0 (L) 04/09/2022 1103   HGB 13.4 02/12/2015 1137   HCT 29.2 (L) 04/09/2022 1103   HCT 40.9 02/12/2015 1137   PLT 239 04/09/2022 1103   PLT 272 02/12/2015 1137    BMET    Component Value Date/Time   NA 141 04/09/2022 1103   NA 140 02/12/2015 1137   K 3.7 04/09/2022 1103   K 2.9 (L) 02/12/2015 1137   CL 104 04/09/2022 1103   CL 107 02/12/2015 1137   CO2 28 04/09/2022 1103   CO2 27 02/12/2015 1137   GLUCOSE 133 (H) 04/09/2022 1103   GLUCOSE 120 (H) 02/12/2015 1137   BUN 18 04/09/2022 1103   BUN 15 02/12/2015 1137   CREATININE 0.83 04/09/2022 1103   CREATININE 0.78 02/12/2015 1137   CALCIUM 8.3 (L) 04/09/2022 1103   CALCIUM 9.0 02/12/2015 1137   GFRNONAA >60 04/09/2022 1103   GFRNONAA >60 02/12/2015 1137   GFRAA >60 11/23/2019 1330   GFRAA >60 02/12/2015 1137    Assessment/Planning: POD #5 s/p right AKA  I added Percocet and Robaxin today to try to help with the pain Transition over to all oral medications Work towards discharge to rehab when bed is available   [name]  04/11/2022, 1:09 PM

## 2022-04-11 NOTE — TOC Progression Note (Signed)
Transition of Care [facility]) - Progression Note    Patient Details  Name: Carla [name] MRN: [id number] Date of Birth: 22-Aug-1961  Transition of Care [facility]) CM/SW Contact  Beverly Sessions, RN Phone Number: 04/11/2022, 3:26 PM  Clinical Narrative:     Insurance auth obtained.  Per MD anticipated DC tomorrow.  Patient updated.  [facility] [place] updated   Expected Discharge Plan: [place] Barriers to Discharge: Continued Medical Work up  Expected Discharge Plan and Services Expected Discharge Plan: [place]   Discharge Planning Services: CM Consult Post Acute Care Choice: [place] Living arrangements for the past 2 months: Single Family Home                                       Social Determinants of Health (SDOH) Interventions    Readmission Risk Interventions    03/24/2022    9:51 AM 12/02/2021   11:23 AM  Readmission Risk Prevention Plan  Post Dischage Appt  Complete  Medication Screening  Complete  Transportation Screening Complete Complete  PCP or Specialist Appt within 3-5 Days Complete   HRI or [place] Complete   Social Work Consult for [place] Planning/Counseling Complete   Palliative Care Screening Not Applicable   Medication Review Press photographer) Complete

## 2022-04-11 NOTE — TOC Progression Note (Signed)
Transition of Care [facility]) - Progression Note    Patient Details  Name: EMELIE NEWSOM MRN: [id number] Date of Birth: September 27, 1961  Transition of Care [facility]) CM/SW Contact  Beverly Sessions, RN Phone Number: 04/11/2022, 10:44 AM  Clinical Narrative:      Josem Kaufmann still pending for SNF.  Reached out through [place] portal for update Expected Discharge Plan: [place] Barriers to Discharge: Continued Medical Work up  Expected Discharge Plan and Services Expected Discharge Plan: [place]   Discharge Planning Services: CM Consult Post Acute Care Choice: [place] Living arrangements for the past 2 months: Single Family Home                                       Social Determinants of Health (SDOH) Interventions    Readmission Risk Interventions    03/24/2022    9:51 AM 12/02/2021   11:23 AM  Readmission Risk Prevention Plan  Post Dischage Appt  Complete  Medication Screening  Complete  Transportation Screening Complete Complete  PCP or Specialist Appt within 3-5 Days Complete   HRI or [place] Complete   Social Work Consult for [place] Planning/Counseling Complete   Palliative Care Screening Not Applicable   Medication Review Press photographer) Complete

## 2022-04-11 NOTE — Progress Notes (Signed)
Physical Therapy Treatment Patient Details Name: Carla [name] MRN: [id number] DOB: 11/16/1960 Today's Date: 04/11/2022   History of Present Illness Carla [name] is a 61 y.o. female who is submitted for pre-surgical anesthesia review and clearance prior to her undergoing the above procedure. Patient is a Current Smoker. [name] includes: CAD, NSTEMI, angina, CHF, PAF, PAD, aortic atherosclerosis, palpitations, SMA thrombosis/embolus, chronic mesenteric ischemia, HTN, HLD, prediabetes, COPD, DOE, GERD (on daily PPI), anemia, peripheral edema, OA, chronic pain syndrome, anxiety, depression.    PT Comments    Patient agreeable to PT. She complains of pain but her pain seems more controlled today than in previous sessions with no grimacing or guarding of residual limb with movement. She participated with therapeutic exercises for BLE. Education on positioning of residual limb to prevent contracture as well as desensitization techniques. She was able to stand today with one person assistance for 2.5 minutes with the rolling walker. She is making progress with functional independence. Recommend to continue PT to maximize independence and decrease caregiver burden. SNF recommended at discharge.    Recommendations for follow up therapy are one component of a multi-disciplinary discharge planning process, led by the attending physician.  Recommendations may be updated based on patient status, additional functional criteria and insurance authorization.  Follow Up Recommendations  Skilled nursing-short term rehab (<3 hours/day)     Assistance Recommended at Discharge Frequent or constant Supervision/Assistance  Patient can return home with the following Two people to help with walking and/or transfers;A lot of help with bathing/dressing/bathroom;Assistance with cooking/housework;Direct supervision/assist for medications management;Direct supervision/assist for financial management;Help with stairs or  ramp for entrance;Assist for transportation   Equipment Recommendations  Rolling walker (2 wheels);BSC/3in1;Wheelchair (measurements PT);Wheelchair cushion (measurements PT)    Recommendations for Other Services       Precautions / Restrictions Precautions Precautions: Fall Restrictions Weight Bearing Restrictions: No     Mobility  Bed Mobility Overal bed mobility: Needs Assistance       Supine to sit: Min assist Sit to supine: Min assist   General bed mobility comments: increased independence with mobility this session. assistance for trunk support to sit upright. assistance for LLE support to return to bed. verbal cues for sequencing and technique    Transfers Overall transfer level: Needs assistance Equipment used: Rolling walker (2 wheels) Transfers: Sit to/from Stand Sit to Stand: Max assist, From elevated surface           General transfer comment: patient has difficulty with following commands for hand placement. she is impulsive at times with standing efforts. unable to stand on the first attepmt but was able to stand with second attempt.    Ambulation/Gait               General Gait Details: not attempted   Stairs             Wheelchair Mobility    Modified Rankin (Stroke Patients Only)       Balance   Sitting-balance support: Feet supported Sitting balance-[name]: Fair     Standing balance support: Bilateral upper extremity supported Standing balance-[name]: Fair Standing balance comment: minimal assistance to min guard assistance provided while using rolling walker with standing tolerance of 2.5 minutes                            Cognition Arousal/Alertness: Awake/alert Behavior During Therapy: WFL for tasks assessed/performed Overall Cognitive Status: Within Functional Limits for tasks  assessed                                 General Comments: she was more motivated to participate today.  still remains a little tearful at times        Exercises General Exercises - Lower Extremity Ankle Circles/Pumps: AROM, Strengthening, Left, 10 reps, Supine Heel Slides: AAROM, Strengthening, AROM, 10 reps, Supine, Left Hip ABduction/ADduction: AAROM, Strengthening, Left, 10 reps, Supine Straight Leg Raises: AAROM, Strengthening, Left, 10 reps, Supine Amputee Exercises Towel Squeeze: AROM, Strengthening, Both, 5 reps, Supine Straight Leg Raises: AAROM, Strengthening, Right, 5 reps, Supine Other Exercises Other Exercises: verbal cues for exercise technique. also educated patient on positioning of residual limb to prevent hip flexion contracture as well as desensitization techniques. the patient reports phantom limb sensations periodially in her residual limb    General Comments        Pertinent Vitals/Pain Pain Assessment Pain Assessment: Faces Faces Pain Scale: Hurts little more Pain Location: RLE Pain Descriptors / Indicators: Sore Pain Intervention(s): Limited activity within patient's tolerance, Premedicated before session, Monitored during session    Home Living                          Prior Function            PT Goals (current goals can now be found in the care plan section) Acute Rehab PT Goals Patient Stated Goal: to walk PT Goal Formulation: With patient Time For Goal Achievement: 04/21/22 Potential to Achieve Goals: Good Progress towards PT goals: Progressing toward goals    Frequency    Min 2X/week      PT Plan Current plan remains appropriate    Co-evaluation              AM-PAC PT "6 Clicks" Mobility   Outcome Measure  Help needed turning from your back to your side while in a flat bed without using bedrails?: A Lot Help needed moving from lying on your back to sitting on the side of a flat bed without using bedrails?: A Lot Help needed moving to and from a bed to a chair (including a wheelchair)?: A Lot Help needed standing  up from a chair using your arms (e.g., wheelchair or bedside chair)?: Total Help needed to walk in hospital room?: Total Help needed climbing 3-5 steps with a railing? : Total 6 Click Score: 9    End of Session   Activity Tolerance: Patient tolerated treatment well Patient left: in chair;with call bell/phone within reach;with chair alarm set Nurse Communication: Mobility status PT Visit Diagnosis: Muscle weakness (generalized) (M62.81);Difficulty in walking, not elsewhere classified (R26.2);Pain Pain - Right/Left: Right Pain - part of body: Leg     Time: 1036-1110 PT Time Calculation (min) (ACUTE ONLY): 34 min  Charges:  $Therapeutic Exercise: 8-22 mins $Therapeutic Activity: 8-22 mins                     Minna Merritts, PT, MPT    Percell Locus 04/11/2022, 1:14 PM

## 2022-04-11 NOTE — Care Management Important Message (Signed)
Important Message  Patient Details  Name: Carla [name] MRN: [id number] Date of Birth: 1961/04/04   Medicare Important Message Given:  Yes     Juliann Pulse A Zamauri Nez 04/11/2022, 2:20 PM

## 2022-04-12 MED ORDER — METHOCARBAMOL 500 MG PO TABS: 500.0000 mg | ORAL_TABLET | Freq: Three times a day (TID) | ORAL | 0 refills | Status: DC | PRN

## 2022-04-12 MED ORDER — OXYCODONE-ACETAMINOPHEN 5-325 MG PO TABS: 1.0000 | ORAL_TABLET | Freq: Four times a day (QID) | ORAL | 0 refills | Status: DC | PRN

## 2022-04-12 NOTE — Progress Notes (Signed)
[facility]  Daily Progress Note   Subjective  -   Still with pain. No events overnight  Objective Vitals:   04/11/22 2005 04/11/22 2007 04/12/22 0210 04/12/22 0713  BP: (!) 90/57 (!) 92/54 115/60 120/64  Pulse: 63 63 61 66  Resp: '20 20 18 17  '$ Temp: 98 F (36.7 C) 98 F (36.7 C) 97.8 F (36.6 C) 97.9 F (36.6 C)  TempSrc:    Oral  SpO2: 95% 95% 95% 94%  Weight:      Height:        Intake/Output Summary (Last 24 hours) at 04/12/2022 1211 Last data filed at 04/12/2022 1006 Gross per 24 hour  Intake 620 ml  Output 850 ml  Net -230 ml    PULM  CTAB CV  RRR   Laboratory CBC    Component Value Date/Time   WBC 8.6 04/09/2022 1103   HGB 9.0 (L) 04/09/2022 1103   HGB 13.4 02/12/2015 1137   HCT 29.2 (L) 04/09/2022 1103   HCT 40.9 02/12/2015 1137   PLT 239 04/09/2022 1103   PLT 272 02/12/2015 1137    BMET    Component Value Date/Time   NA 141 04/09/2022 1103   NA 140 02/12/2015 1137   K 3.7 04/09/2022 1103   K 2.9 (L) 02/12/2015 1137   CL 104 04/09/2022 1103   CL 107 02/12/2015 1137   CO2 28 04/09/2022 1103   CO2 27 02/12/2015 1137   GLUCOSE 133 (H) 04/09/2022 1103   GLUCOSE 120 (H) 02/12/2015 1137   BUN 18 04/09/2022 1103   BUN 15 02/12/2015 1137   CREATININE 0.83 04/09/2022 1103   CREATININE 0.78 02/12/2015 1137   CALCIUM 8.3 (L) 04/09/2022 1103   CALCIUM 9.0 02/12/2015 1137   GFRNONAA >60 04/09/2022 1103   GFRNONAA >60 02/12/2015 1137   GFRAA >60 11/23/2019 1330   GFRAA >60 02/12/2015 1137    Assessment/Planning: POD #6 s/p right AKA for irreversible ischemia with rest pain  To rehab today RTC 2-3 weeks   [name]  04/12/2022, 12:11 PM

## 2022-04-12 NOTE — TOC Transition Note (Signed)
Transition of Care [facility]) - CM/SW Discharge Note   Patient Details  Name: Carla [name] MRN: [id number] Date of Birth: Jul 06, 1961  Transition of Care [facility]) CM/SW Contact:  Beverly Sessions, RN Phone Number: 04/12/2022, 1:35 PM   Clinical Narrative:      Patient will DC to: [facility]  Anticipated DC date: .todays  Family notified: Patient to update family Transport by: [facility]  Per MD patient ready for DC to . RN, patient, and facility notified of DC. Discharge Summary sent to facility. RN given number for report. DC packet on chart. Ambulance transport requested for patient.  TOC signing off.  [name] [facility] [phone]    Barriers to Discharge: Continued Medical Work up   Patient Goals and CMS Choice Patient states their goals for this hospitalization and ongoing recovery are:: patient wants to go home- daughter wants patient to go to rehab CMS Medicare.gov Compare Post Acute Care list provided to:: Patient Choice offered to / list presented to : Patient, Adult Children  Discharge Placement                       Discharge Plan and Services   Discharge Planning Services: CM Consult Post Acute Care Choice: [place]                               Social Determinants of Health (SDOH) Interventions     Readmission Risk Interventions    03/24/2022    9:51 AM 12/02/2021   11:23 AM  Readmission Risk Prevention Plan  Post Dischage Appt  Complete  Medication Screening  Complete  Transportation Screening Complete Complete  PCP or Specialist Appt within 3-5 Days Complete   HRI or [place] Complete   Social Work Consult for [facility] Planning/Counseling Complete   Palliative Care Screening Not Applicable   Medication Review Press photographer) Complete

## 2022-04-12 NOTE — Discharge Summary (Cosign Needed Addendum)
[facility]    Discharge Summary    Patient ID:  Carla [name] MRN: [id number] DOB/AGE: 62-Sep-1962 62 y.o.  Admit date: 04/06/2022 Discharge date: 04/12/2022 Date of Surgery: 04/06/2022 Surgeon: Surgeon(s): Lucky Cowboy Erskine Squibb, MD  Admission Diagnosis: Atherosclerosis of artery of extremity with rest pain [facility]) [I70.229]  Discharge Diagnoses:  Atherosclerosis of artery of extremity with rest pain [facility]) [I70.229]  Secondary Diagnoses: Past Medical History:  Diagnosis Date   Abdominal aortic atherosclerosis ([place])    a. 05/2017 CTA abd/pelvis: significant atherosclerotic dzs of the infrarenal abd Ao w/ some mural thrombus. No aneurysm or dissection.   Acute focal ischemia of small intestine (HCC)    Acute right-sided low back pain with right-sided sciatica 06/13/2017   AKI (acute kidney injury) ([place]) 07/29/2017   Anemia    Anginal pain ([place])    a. 08/2012 Lexiscan MV: EF 54%, non ischemia/infarct.   Anxiety and depression    Baker's cyst of knee, right    a. 07/2016 U/S: 4.1 x 1.4 x 2.9 cystic structure in R poplitetal fossa.   Bell's palsy    CAD (coronary artery disease)    CHF (congestive heart failure) ([place])    a.) TTE 06/20/2017: EF 60-65%, no rwma, G1DD, no source of cardiac emboli.   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 05/2017 CTA Abd/pelvis: apparent thrombus or embolus in prox SMA (70-90%); b. 05/2017 catheter directed tPA, mechanical thrombectomy, and stenting of the SMA.   Essential hypertension with goal blood pressure less than 140/90 01/19/2016   Gastroesophageal reflux disease 01/19/2016   GERD (gastroesophageal reflux disease)    H/O colectomy    History of 2019 novel coronavirus disease (COVID-19) 11/14/2019   History of kidney  stones    Hyperlipidemia    Hypertension    Morbid obesity with BMI of 40.0-44.9, adult ([place])    NSTEMI (non-ST elevated myocardial infarction) ([place]) 06/27/2021   a.) high sensitivity troponins trended: 893 --> 1587 --> 1748 --> 868 --> 735 ng/L   Occlusive mesenteric ischemia ([place]) 06/19/2017   Osteoarthritis    PAD (peripheral artery disease) (HCC)    PAF (paroxysmal atrial fibrillation) (HCC)    a.) CHA2DS2-VASc = 6 (sex, CHF, HTN, DVT x2, aortic plaque). b.) rate/rhythm maintained on oral metoprolol tartrate; chronically anticoagulated with standard dose apixaban.   Palpitations    Peripheral edema    Personal history of other malignant neoplasm of skin 06/01/2016   Pneumonia    Pre-diabetes    Primary osteoarthritis of both knees 06/13/2017   SBO (small bowel obstruction) ([place]) 08/06/2017   Skin cancer of nose    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HCC)    Vitamin D deficiency     Procedure(s): AMPUTATION ABOVE KNEE  Discharged Condition: good  HPI:  Carla [name] is a 61 year old female who presented to [facility] for amputation of her right lower extremity due to irreversible ischemia and longstanding rest pain.  The patient has had a longstanding history of peripheral arterial disease and multiple attempts at revascularization of the right lower extremity.  The patient tolerated the surgery without issue.  She still continues to have pain in the right stump however the patient has long had pain control issues  prior to the amputation.  Currently the wound is clean dry and intact.  The patient will be going to rehab following her hospitalization.  Hospital Course:  Carla [name] is a 61 y.o. female is S/P Right above-knee amputation Procedure(s): AMPUTATION ABOVE KNEE Extubated: POD # 0 Physical exam: Wounds healing well, right above-knee amputation Post-op wounds clean, dry, intact or healing well Pt. Ambulating, voiding and taking PO diet without  difficulty. Pt pain controlled with PO pain meds. Labs as below Complications:none  Consults:    Significant Diagnostic Studies: CBC Lab Results  Component Value Date   WBC 8.6 04/09/2022   HGB 9.0 (L) 04/09/2022   HCT 29.2 (L) 04/09/2022   MCV 84.6 04/09/2022   PLT 239 04/09/2022    BMET    Component Value Date/Time   NA 141 04/09/2022 1103   NA 140 02/12/2015 1137   K 3.7 04/09/2022 1103   K 2.9 (L) 02/12/2015 1137   CL 104 04/09/2022 1103   CL 107 02/12/2015 1137   CO2 28 04/09/2022 1103   CO2 27 02/12/2015 1137   GLUCOSE 133 (H) 04/09/2022 1103   GLUCOSE 120 (H) 02/12/2015 1137   BUN 18 04/09/2022 1103   BUN 15 02/12/2015 1137   CREATININE 0.83 04/09/2022 1103   CREATININE 0.78 02/12/2015 1137   CALCIUM 8.3 (L) 04/09/2022 1103   CALCIUM 9.0 02/12/2015 1137   GFRNONAA >60 04/09/2022 1103   GFRNONAA >60 02/12/2015 1137   GFRAA >60 11/23/2019 1330   GFRAA >60 02/12/2015 1137   COAG Lab Results  Component Value Date   INR 0.9 06/27/2021   INR 1.1 11/23/2019   INR 1.0 11/07/2019     Disposition:  Discharge to :Rehab Discharge Instructions     Call MD for:  redness, tenderness, or signs of infection (pain, swelling, bleeding, redness, odor or green/yellow discharge around incision site)   Complete by: As directed    Call MD for:  severe or increased pain, loss or decreased feeling  in affected limb(s)   Complete by: As directed    Call MD for:  temperature >100.5   Complete by: As directed    Driving Restrictions   Complete by: As directed    No driving   Lifting restrictions   Complete by: As directed    No lifting for 6 weeks   Resume previous diet   Complete by: As directed       Allergies as of 04/12/2022       Reactions   Gabapentin    Tremors    Bactrim [sulfamethoxazole-trimethoprim] Itching        Medication List     TAKE these medications    amLODipine 5 MG tablet Commonly known as: NORVASC Take 5 mg by mouth daily.    apixaban 5 MG Tabs tablet Commonly known as: ELIQUIS Take 1 tablet (5 mg total) by mouth 2 (two) times daily.   aspirin EC 81 MG tablet Take 81 mg by mouth daily. Swallow whole.   atorvastatin 80 MG tablet Commonly known as: LIPITOR Take 80 mg by mouth at bedtime.   citalopram 40 MG tablet Commonly known as: CeleXA Take 1 tablet (40 mg total) by mouth daily.   Combivent Respimat 20-100 MCG/ACT Aers respimat Generic drug: Ipratropium-Albuterol   ferrous sulfate 325 (65 FE) MG tablet Take 325 mg by mouth daily.   furosemide 40 MG tablet Commonly known as: LASIX Take 40 mg by mouth daily.   isosorbide mononitrate 60 MG 24  hr tablet Commonly known as: IMDUR Take 60 mg by mouth daily.   methocarbamol 500 MG tablet Commonly known as: ROBAXIN Take 1 tablet (500 mg total) by mouth every 8 (eight) hours as needed for muscle spasms.   metoprolol tartrate 50 MG tablet Commonly known as: LOPRESSOR Take 1 tablet (50 mg total) by mouth 2 (two) times daily.   multivitamin with minerals Tabs tablet Take 1 tablet by mouth daily.   nitroGLYCERIN 0.4 MG SL tablet Commonly known as: NITROSTAT Place 0.4 mg under the tongue as directed.   omeprazole 20 MG capsule Commonly known as: PRILOSEC Take 20 mg by mouth 2 (two) times daily before a meal.   oxyCODONE-acetaminophen 5-325 MG tablet Commonly known as: PERCOCET/ROXICET Take 1-2 tablets by mouth every 6 (six) hours as needed for moderate pain. What changed:  how much to take reasons to take this   pregabalin 100 MG capsule Commonly known as: Lyrica Take 1 capsule (100 mg total) by mouth 3 (three) times daily.   traMADol 50 MG tablet Commonly known as: ULTRAM Take 50 mg by mouth every 6 (six) hours as needed.       Verbal and written Discharge instructions given to the patient. Wound care per Discharge AVS  Contact information for follow-up providers     Kris Hartmann, NP Follow up in 2 week(s).   Specialty:  Vascular Surgery Why: Please return for staple removal see FB/JD Contact information: [place] [address] [phone]              Contact information for after-discharge care     Destination     [facility] Preferred SNF .   Service: Skilled Nursing Contact information: [place] [phone]                     Signed: Kris Hartmann, NP  04/12/2022, 1:00 PM

## 2022-04-12 NOTE — Progress Notes (Signed)
Physical Therapy Treatment Patient Details Name: Carla [name] MRN: [id number] DOB: July 18, 1961 Today's Date: 04/12/2022   History of Present Illness Carla [name] is a 61 y.o. female who is submitted for pre-surgical anesthesia review and clearance prior to her undergoing the above procedure. Patient is a Current Smoker. [name] includes: CAD, NSTEMI, angina, CHF, PAF, PAD, aortic atherosclerosis, palpitations, SMA thrombosis/embolus, chronic mesenteric ischemia, HTN, HLD, prediabetes, COPD, DOE, GERD (on daily PPI), anemia, peripheral edema, OA, chronic pain syndrome, anxiety, depression.    PT Comments    Patient received in supine, agrees to PT session. Patient improving with bed mobility using bed rails. Continues to have weakness in L LE limiting ability to stand/transfer. She is motivated and making good progress. She will continue to benefit from skilled PT to improve functional independence and strength.     Recommendations for follow up therapy are one component of a multi-disciplinary discharge planning process, led by the attending physician.  Recommendations may be updated based on patient status, additional functional criteria and insurance authorization.  Follow Up Recommendations  Skilled nursing-short term rehab (<3 hours/day) Can patient physically be transported by private vehicle: No   Assistance Recommended at Discharge Frequent or constant Supervision/Assistance  Patient can return home with the following Two people to help with walking and/or transfers;A lot of help with bathing/dressing/bathroom;Assistance with cooking/housework;Direct supervision/assist for medications management;Direct supervision/assist for financial management;Help with stairs or ramp for entrance;Assist for transportation   Equipment Recommendations  BSC/3in1;Wheelchair (measurements PT);Wheelchair cushion (measurements PT);Hospital bed;Other (comment) (tub bench)    Recommendations for Other  Services       Precautions / Restrictions Precautions Precautions: Fall Restrictions Weight Bearing Restrictions: Yes RLE Weight Bearing: Non weight bearing Other Position/Activity Restrictions: new R AKA     Mobility  Bed Mobility Overal bed mobility: Needs Assistance Bed Mobility: Supine to Sit, Sit to Supine     Supine to sit: Min assist Sit to supine: Modified independent (Device/Increase time)   General bed mobility comments: patient required min A to raise trunk to seated position. Able to return to supine independently. Patient was able to scoot in sitting on bed about 2 goot scoots.    Transfers                   General transfer comment: not attempted this session    Ambulation/Gait               General Gait Details: not attempted   Stairs             Wheelchair Mobility    Modified Rankin (Stroke Patients Only)       Balance Overall balance assessment: Needs assistance Sitting-balance support: Feet supported Sitting balance-[name]: Good         Standing balance comment: did not attempt standing this session                            Cognition Arousal/Alertness: Awake/alert Behavior During Therapy: WFL for tasks assessed/performed Overall Cognitive Status: Within Functional Limits for tasks assessed                                 General Comments: patient motivated, pleasant throughout        Exercises Amputee Exercises Quad Sets: AROM, Right, 10 reps Hip ABduction/ADduction: AROM, Right, 5 reps Straight Leg Raises: AROM, Right, 15 reps  Other Exercises Other Exercises: L LE exercises: Seated LAQ, SLR, heel slides, x 10 reps    General Comments        Pertinent Vitals/Pain Pain Assessment Pain Assessment: Faces Faces Pain Scale: Hurts a little bit Pain Descriptors / Indicators: Discomfort, Sore Pain Intervention(s): Monitored during session, Repositioned    Home Living                           Prior Function            PT Goals (current goals can now be found in the care plan section) Acute Rehab PT Goals Patient Stated Goal: to walk, improve PT Goal Formulation: With patient Time For Goal Achievement: 04/21/22 Potential to Achieve Goals: Good Progress towards PT goals: Progressing toward goals    Frequency    Min 2X/week      PT Plan Current plan remains appropriate    Co-evaluation              AM-PAC PT "6 Clicks" Mobility   Outcome Measure  Help needed turning from your back to your side while in a flat bed without using bedrails?: A Lot Help needed moving from lying on your back to sitting on the side of a flat bed without using bedrails?: A Lot Help needed moving to and from a bed to a chair (including a wheelchair)?: A Lot Help needed standing up from a chair using your arms (e.g., wheelchair or bedside chair)?: Total Help needed to walk in hospital room?: Total Help needed climbing 3-5 steps with a railing? : Total 6 Click Score: 9    End of Session   Activity Tolerance: Patient tolerated treatment well Patient left: in bed;with call bell/phone within reach;with bed alarm set Nurse Communication: Mobility status PT Visit Diagnosis: Muscle weakness (generalized) (M62.81);Pain;Other abnormalities of gait and mobility (R26.89);Unsteadiness on feet (R26.81) Pain - Right/Left: Right Pain - part of body: Leg     Time: 3570-1779 PT Time Calculation (min) (ACUTE ONLY): 25 min  Charges:  $Therapeutic Exercise: 8-22 mins $Therapeutic Activity: 8-22 mins                     [employer], PT, GCS 04/12/22,12:07 PM

## 2022-04-13 ENCOUNTER — Telehealth (INDEPENDENT_AMBULATORY_CARE_PROVIDER_SITE_OTHER): Payer: Self-pay

## 2022-04-13 NOTE — Telephone Encounter (Signed)
Pt called and LVM statting that she has been placed AS:[facility] [phone] and she needs an Rx for Percocet sent to [facility] on [address]

## 2022-04-13 NOTE — Telephone Encounter (Signed)
Called pt and made her aware that she was discharged with Percocet yesterday.  She stated verbal understanding.

## 2022-04-21 ENCOUNTER — Other Ambulatory Visit (INDEPENDENT_AMBULATORY_CARE_PROVIDER_SITE_OTHER): Payer: Self-pay | Admitting: Nurse Practitioner

## 2022-04-21 ENCOUNTER — Ambulatory Visit (INDEPENDENT_AMBULATORY_CARE_PROVIDER_SITE_OTHER): Payer: Medicare Other | Admitting: Nurse Practitioner

## 2022-04-21 ENCOUNTER — Encounter (INDEPENDENT_AMBULATORY_CARE_PROVIDER_SITE_OTHER): Payer: Self-pay | Admitting: Nurse Practitioner

## 2022-04-21 VITALS — BP 110/63 | HR 73 | Resp 16

## 2022-04-21 DIAGNOSIS — T148XXA Other injury of unspecified body region, initial encounter: Secondary | ICD-10-CM

## 2022-04-21 DIAGNOSIS — L089 Local infection of the skin and subcutaneous tissue, unspecified: Secondary | ICD-10-CM

## 2022-04-21 MED ORDER — DOXYCYCLINE HYCLATE 100 MG PO CAPS: 100.0000 mg | ORAL_CAPSULE | Freq: Two times a day (BID) | ORAL | 0 refills | Status: DC

## 2022-04-24 ENCOUNTER — Ambulatory Visit (INDEPENDENT_AMBULATORY_CARE_PROVIDER_SITE_OTHER): Payer: Medicare Other | Admitting: Nurse Practitioner

## 2022-04-25 LAB — AEROBIC CULTURE

## 2022-04-27 ENCOUNTER — Telehealth (INDEPENDENT_AMBULATORY_CARE_PROVIDER_SITE_OTHER): Payer: Self-pay

## 2022-04-27 NOTE — Telephone Encounter (Addendum)
Kim from [facility] left a message checking to see if the patient should continue taking doxycycline since she will start a new antibiotic. I left a message on  [facility] stating the patient should stop doxycycline and start the ciprofloxacin per Arna Medici NP

## 2022-04-27 NOTE — Telephone Encounter (Signed)
New prescription for Ciprofloxacin has been faxed to [facility] to the attention [place]. I spoke with the patient nurse in charge and made them aware with incoming fax. I also informed patient daughter that new antibiotic will be sent to [facility] for recent lab results.

## 2022-05-01 ENCOUNTER — Encounter (INDEPENDENT_AMBULATORY_CARE_PROVIDER_SITE_OTHER): Payer: Self-pay | Admitting: Nurse Practitioner

## 2022-05-01 ENCOUNTER — Ambulatory Visit (INDEPENDENT_AMBULATORY_CARE_PROVIDER_SITE_OTHER): Payer: Medicare Other | Admitting: Nurse Practitioner

## 2022-05-01 VITALS — BP 105/69 | HR 70 | Resp 17

## 2022-05-01 DIAGNOSIS — Z89611 Acquired absence of right leg above knee: Secondary | ICD-10-CM

## 2022-05-02 ENCOUNTER — Inpatient Hospital Stay
Admission: EM | Admit: 2022-05-02 | Discharge: 2022-05-06 | DRG: 500 | Disposition: A | Discharge status: Home Health | Payer: Medicare Other | Source: Skilled Nursing Facility | Attending: Internal Medicine | Admitting: Internal Medicine

## 2022-05-02 ENCOUNTER — Other Ambulatory Visit (INDEPENDENT_AMBULATORY_CARE_PROVIDER_SITE_OTHER): Payer: Self-pay | Admitting: Nurse Practitioner

## 2022-05-02 ENCOUNTER — Other Ambulatory Visit: Payer: Self-pay

## 2022-05-02 ENCOUNTER — Emergency Department: Payer: Medicare Other

## 2022-05-02 DIAGNOSIS — K219 Gastro-esophageal reflux disease without esophagitis: Secondary | ICD-10-CM

## 2022-05-02 DIAGNOSIS — I2510 Atherosclerotic heart disease of native coronary artery without angina pectoris: Secondary | ICD-10-CM | POA: Diagnosis present

## 2022-05-02 DIAGNOSIS — T8130XA Disruption of wound, unspecified, initial encounter: Secondary | ICD-10-CM | POA: Diagnosis present

## 2022-05-02 DIAGNOSIS — Z79899 Other long term (current) drug therapy: Secondary | ICD-10-CM

## 2022-05-02 DIAGNOSIS — I5032 Chronic diastolic (congestive) heart failure: Secondary | ICD-10-CM | POA: Diagnosis present

## 2022-05-02 DIAGNOSIS — I480 Paroxysmal atrial fibrillation: Secondary | ICD-10-CM | POA: Diagnosis present

## 2022-05-02 DIAGNOSIS — E872 Acidosis, unspecified: Secondary | ICD-10-CM

## 2022-05-02 DIAGNOSIS — T8781 Dehiscence of amputation stump: Secondary | ICD-10-CM | POA: Diagnosis not present

## 2022-05-02 DIAGNOSIS — Z9049 Acquired absence of other specified parts of digestive tract: Secondary | ICD-10-CM | POA: Diagnosis not present

## 2022-05-02 DIAGNOSIS — E66813 Obesity, class 3: Secondary | ICD-10-CM

## 2022-05-02 DIAGNOSIS — E8809 Other disorders of plasma-protein metabolism, not elsewhere classified: Secondary | ICD-10-CM | POA: Diagnosis present

## 2022-05-02 DIAGNOSIS — I739 Peripheral vascular disease, unspecified: Secondary | ICD-10-CM | POA: Diagnosis present

## 2022-05-02 DIAGNOSIS — I252 Old myocardial infarction: Secondary | ICD-10-CM | POA: Diagnosis not present

## 2022-05-02 DIAGNOSIS — Z6841 Body Mass Index (BMI) 40.0 and over, adult: Secondary | ICD-10-CM | POA: Diagnosis not present

## 2022-05-02 DIAGNOSIS — J441 Chronic obstructive pulmonary disease with (acute) exacerbation: Secondary | ICD-10-CM | POA: Diagnosis present

## 2022-05-02 DIAGNOSIS — E440 Moderate protein-calorie malnutrition: Secondary | ICD-10-CM | POA: Diagnosis present

## 2022-05-02 DIAGNOSIS — G8929 Other chronic pain: Secondary | ICD-10-CM | POA: Diagnosis present

## 2022-05-02 DIAGNOSIS — E46 Unspecified protein-calorie malnutrition: Secondary | ICD-10-CM | POA: Diagnosis not present

## 2022-05-02 DIAGNOSIS — I110 Hypertensive heart disease with heart failure: Secondary | ICD-10-CM | POA: Diagnosis present

## 2022-05-02 DIAGNOSIS — Z7901 Long term (current) use of anticoagulants: Secondary | ICD-10-CM

## 2022-05-02 DIAGNOSIS — Z20822 Contact with and (suspected) exposure to covid-19: Secondary | ICD-10-CM | POA: Diagnosis present

## 2022-05-02 DIAGNOSIS — Z882 Allergy status to sulfonamides status: Secondary | ICD-10-CM | POA: Diagnosis not present

## 2022-05-02 DIAGNOSIS — Y835 Amputation of limb(s) as the cause of abnormal reaction of the patient, or of later complication, without mention of misadventure at the time of the procedure: Secondary | ICD-10-CM | POA: Diagnosis present

## 2022-05-02 DIAGNOSIS — D509 Iron deficiency anemia, unspecified: Secondary | ICD-10-CM | POA: Diagnosis present

## 2022-05-02 DIAGNOSIS — I10 Essential (primary) hypertension: Secondary | ICD-10-CM | POA: Diagnosis not present

## 2022-05-02 DIAGNOSIS — Z888 Allergy status to other drugs, medicaments and biological substances status: Secondary | ICD-10-CM

## 2022-05-02 DIAGNOSIS — Z8249 Family history of ischemic heart disease and other diseases of the circulatory system: Secondary | ICD-10-CM

## 2022-05-02 DIAGNOSIS — Z85828 Personal history of other malignant neoplasm of skin: Secondary | ICD-10-CM

## 2022-05-02 DIAGNOSIS — Z87891 Personal history of nicotine dependence: Secondary | ICD-10-CM

## 2022-05-02 DIAGNOSIS — E782 Mixed hyperlipidemia: Secondary | ICD-10-CM | POA: Diagnosis present

## 2022-05-02 DIAGNOSIS — Z7982 Long term (current) use of aspirin: Secondary | ICD-10-CM

## 2022-05-02 DIAGNOSIS — T8753 Necrosis of amputation stump, right lower extremity: Secondary | ICD-10-CM | POA: Diagnosis not present

## 2022-05-02 DIAGNOSIS — T8743 Infection of amputation stump, right lower extremity: Principal | ICD-10-CM | POA: Diagnosis present

## 2022-05-02 DIAGNOSIS — J9601 Acute respiratory failure with hypoxia: Secondary | ICD-10-CM

## 2022-05-02 DIAGNOSIS — T148XXA Other injury of unspecified body region, initial encounter: Secondary | ICD-10-CM | POA: Diagnosis present

## 2022-05-02 DIAGNOSIS — R7303 Prediabetes: Secondary | ICD-10-CM | POA: Diagnosis present

## 2022-05-02 DIAGNOSIS — L089 Local infection of the skin and subcutaneous tissue, unspecified: Secondary | ICD-10-CM | POA: Diagnosis present

## 2022-05-02 DIAGNOSIS — Z8616 Personal history of COVID-19: Secondary | ICD-10-CM

## 2022-05-02 DIAGNOSIS — Z89611 Acquired absence of right leg above knee: Secondary | ICD-10-CM | POA: Diagnosis not present

## 2022-05-02 LAB — CBC WITH DIFFERENTIAL/PLATELET
Abs Immature Granulocytes: 0.02 10*3/uL (ref 0.00–0.07)
Basophils Absolute: 0 10*3/uL (ref 0.0–0.1)
Basophils Relative: 1 %
Eosinophils Absolute: 0.3 10*3/uL (ref 0.0–0.5)
Eosinophils Relative: 4 %
HCT: 31.2 % — ABNORMAL LOW (ref 36.0–46.0)
Hemoglobin: 9.4 g/dL — ABNORMAL LOW (ref 12.0–15.0)
Immature Granulocytes: 0 %
Lymphocytes Relative: 24 %
Lymphs Abs: 1.8 10*3/uL (ref 0.7–4.0)
MCH: 26.6 pg (ref 26.0–34.0)
MCHC: 30.1 g/dL (ref 30.0–36.0)
MCV: 88.1 fL (ref 80.0–100.0)
Monocytes Absolute: 0.5 10*3/uL (ref 0.1–1.0)
Monocytes Relative: 7 %
Neutro Abs: 4.7 10*3/uL (ref 1.7–7.7)
Neutrophils Relative %: 64 %
Platelets: 282 10*3/uL (ref 150–400)
RBC: 3.54 MIL/uL — ABNORMAL LOW (ref 3.87–5.11)
RDW: 19.9 % — ABNORMAL HIGH (ref 11.5–15.5)
WBC: 7.4 10*3/uL (ref 4.0–10.5)
nRBC: 0 % (ref 0.0–0.2)

## 2022-05-02 LAB — SARS CORONAVIRUS 2 BY RT PCR: SARS Coronavirus 2 by RT PCR: NEGATIVE

## 2022-05-02 LAB — COMPREHENSIVE METABOLIC PANEL
ALT: 10 U/L (ref 0–44)
AST: 15 U/L (ref 15–41)
Albumin: 2.8 g/dL — ABNORMAL LOW (ref 3.5–5.0)
Alkaline Phosphatase: 105 U/L (ref 38–126)
Anion gap: 8 (ref 5–15)
BUN: 12 mg/dL (ref 8–23)
CO2: 26 mmol/L (ref 22–32)
Calcium: 8.7 mg/dL — ABNORMAL LOW (ref 8.9–10.3)
Chloride: 106 mmol/L (ref 98–111)
Creatinine, Ser: 0.84 mg/dL (ref 0.44–1.00)
GFR, Estimated: >60 mL/min (ref >60)
Glucose, Bld: 103 mg/dL — ABNORMAL HIGH (ref 70–99)
Potassium: 4 mmol/L (ref 3.5–5.1)
Sodium: 140 mmol/L (ref 135–145)
Total Bilirubin: 0.4 mg/dL (ref 0.3–1.2)
Total Protein: 6.4 g/dL — ABNORMAL LOW (ref 6.5–8.1)

## 2022-05-02 LAB — SURGICAL PCR SCREEN
MRSA, PCR: POSITIVE — AB
Staphylococcus aureus: POSITIVE — AB

## 2022-05-02 LAB — GLUCOSE, CAPILLARY: Glucose-Capillary: 140 mg/dL — ABNORMAL HIGH (ref 70–99)

## 2022-05-02 LAB — LACTIC ACID, PLASMA
Lactic Acid, Venous: 2.2 mmol/L (ref 0.5–1.9)
Lactic Acid, Venous: 1.8 mmol/L (ref 0.5–1.9)

## 2022-05-02 LAB — PROTIME-INR
INR: 1.3 — ABNORMAL HIGH (ref 0.8–1.2)
Prothrombin Time: 15.9 seconds — ABNORMAL HIGH (ref 11.4–15.2)

## 2022-05-02 MED ORDER — ONDANSETRON HCL 4 MG PO TABS
4.0000 mg | ORAL_TABLET | Freq: Four times a day (QID) | ORAL | Status: DC | PRN
Start: 2022-05-02 — End: 2022-05-03

## 2022-05-02 MED ORDER — SODIUM CHLORIDE 0.9 % IV SOLN: Freq: Once | INTRAVENOUS | Status: AC

## 2022-05-02 MED ORDER — ENSURE ENLIVE PO LIQD
237.0000 mL | Freq: Two times a day (BID) | ORAL | Status: DC
  Administered 2022-05-04 – 2022-05-05 (×4): 237 mL via ORAL

## 2022-05-02 MED ORDER — VANCOMYCIN HCL 1250 MG/250ML IV SOLN
1250.0000 mg | Freq: Once | INTRAVENOUS | Status: AC
  Administered 2022-05-02: 1250 mg via INTRAVENOUS
  Filled 2022-05-02: qty 250

## 2022-05-02 MED ORDER — FENTANYL CITRATE PF 50 MCG/ML IJ SOSY
50.0000 ug | PREFILLED_SYRINGE | Freq: Once | INTRAMUSCULAR | Status: AC
  Administered 2022-05-02: 50 ug via INTRAVENOUS
  Filled 2022-05-02: qty 1

## 2022-05-02 MED ORDER — VANCOMYCIN HCL 1500 MG/300ML IV SOLN
1500.0000 mg | INTRAVENOUS | Status: DC
  Administered 2022-05-03 – 2022-05-04 (×2): 1500 mg via INTRAVENOUS
  Filled 2022-05-02 (×2): qty 300

## 2022-05-02 MED ORDER — SODIUM CHLORIDE 0.9 % IV SOLN: INTRAVENOUS | Status: DC

## 2022-05-02 MED ORDER — ATORVASTATIN CALCIUM 20 MG PO TABS
80.0000 mg | ORAL_TABLET | Freq: Every day | ORAL | Status: DC
Start: 2022-05-02 — End: 2022-05-06
  Administered 2022-05-02 – 2022-05-05 (×4): 80 mg via ORAL
  Filled 2022-05-02 (×4): qty 4

## 2022-05-02 MED ORDER — PANTOPRAZOLE SODIUM 40 MG PO TBEC
40.0000 mg | DELAYED_RELEASE_TABLET | Freq: Every day | ORAL | Status: DC
  Administered 2022-05-04 – 2022-05-06 (×3): 40 mg via ORAL
  Filled 2022-05-02 (×3): qty 1

## 2022-05-02 MED ORDER — SODIUM CHLORIDE 0.9 % IV SOLN
2.0000 g | Freq: Once | INTRAVENOUS | Status: AC
  Administered 2022-05-02: 2 g via INTRAVENOUS
  Filled 2022-05-02: qty 12.5

## 2022-05-02 MED ORDER — ASPIRIN 81 MG PO TBEC
81.0000 mg | DELAYED_RELEASE_TABLET | Freq: Every day | ORAL | Status: DC
  Administered 2022-05-02 – 2022-05-06 (×4): 81 mg via ORAL
  Filled 2022-05-02 (×4): qty 1

## 2022-05-02 MED ORDER — ACETAMINOPHEN 650 MG RE SUPP: 650.0000 mg | Freq: Four times a day (QID) | RECTAL | Status: DC | PRN

## 2022-05-02 MED ORDER — ACETAMINOPHEN 325 MG PO TABS
650.0000 mg | ORAL_TABLET | Freq: Four times a day (QID) | ORAL | Status: DC | PRN
  Administered 2022-05-04: 650 mg via ORAL
  Filled 2022-05-02: qty 2

## 2022-05-02 MED ORDER — VANCOMYCIN HCL IN DEXTROSE 1-5 GM/200ML-% IV SOLN
1000.0000 mg | Freq: Once | INTRAVENOUS | Status: AC
  Administered 2022-05-02: 1000 mg via INTRAVENOUS
  Filled 2022-05-02: qty 200

## 2022-05-02 MED ORDER — SODIUM CHLORIDE 0.9 % IV SOLN
2.0000 g | Freq: Three times a day (TID) | INTRAVENOUS | Status: DC
  Administered 2022-05-02 – 2022-05-04 (×5): 2 g via INTRAVENOUS
  Filled 2022-05-02: qty 2
  Filled 2022-05-02: qty 12.5
  Filled 2022-05-02: qty 2
  Filled 2022-05-02 (×5): qty 12.5

## 2022-05-02 MED ORDER — FERROUS SULFATE 325 (65 FE) MG PO TABS
325.0000 mg | ORAL_TABLET | Freq: Every day | ORAL | Status: DC
  Administered 2022-05-04 – 2022-05-06 (×3): 325 mg via ORAL
  Filled 2022-05-02 (×3): qty 1

## 2022-05-02 MED ORDER — ONDANSETRON HCL 4 MG/2ML IJ SOLN: 4.0000 mg | Freq: Four times a day (QID) | INTRAMUSCULAR | Status: DC | PRN

## 2022-05-02 MED ORDER — OXYCODONE-ACETAMINOPHEN 5-325 MG PO TABS
1.0000 | ORAL_TABLET | ORAL | Status: DC | PRN
  Administered 2022-05-02 – 2022-05-04 (×7): 1 via ORAL
  Filled 2022-05-02 (×7): qty 1

## 2022-05-02 MED ORDER — HEPARIN SODIUM (PORCINE) 5000 UNIT/ML IJ SOLN
5000.0000 [IU] | Freq: Three times a day (TID) | INTRAMUSCULAR | Status: AC
  Administered 2022-05-02 – 2022-05-04 (×6): 5000 [IU] via SUBCUTANEOUS
  Filled 2022-05-02 (×6): qty 1

## 2022-05-02 MED ORDER — METHOCARBAMOL 500 MG PO TABS
500.0000 mg | ORAL_TABLET | Freq: Three times a day (TID) | ORAL | Status: DC | PRN
  Administered 2022-05-04: 500 mg via ORAL
  Filled 2022-05-02: qty 1

## 2022-05-02 MED ORDER — MUPIROCIN 2 % EX OINT
1.0000 | TOPICAL_OINTMENT | Freq: Two times a day (BID) | CUTANEOUS | Status: DC
  Administered 2022-05-02 – 2022-05-05 (×7): 1 via NASAL
  Filled 2022-05-02: qty 22

## 2022-05-02 NOTE — ED Triage Notes (Signed)
First nurse: Pt here vai [facility] with possible sepsis. Pt had right AKA recently. Site is red and swollen.  18 SR-[id number] 42-end tidal 98.5-oral

## 2022-05-02 NOTE — Progress Notes (Signed)
   05/02/22 1415  Clinical Encounter Type  Visited With Patient  Visit Type Initial;Spiritual support;Social support  Spiritual Encounters  Spiritual Needs Prayer   Chaplain Delphin Funes engaged patient while in [place] in ED. Recognized pt from previous visits and asked about her well-being. Pt stated that she had indicated to SNF staff that she knew something was wrong with wound but they insisted to her it was fine. Chaplain B offered compassionate care and presence as well as gratitude to see pt again.  Chaplain B offered hospitality and asked about pt's comfort. Chaplain B offered prayer at pt request and also offered to f/u when pt settled. Visit seemed much appreciated.

## 2022-05-02 NOTE — Consult Note (Signed)
[facility] Consult Note  MRN : [id number]  Carla [name] is a 61 y.o. (12/14/1960) female who presents with chief complaint of  Chief Complaint  Patient presents with   Leg Pain  .   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  At patient's initial wound evaluation the incision was reddened with some drainage and this wound was cultured.  The culture returned positive for Pseudomonas and her nursing facility was contacted prior to begin ciprofloxacin 750 mg.  At her most recent follow-up it was noted that the wound had dehisced further.  Plans were discussed with the patient to have a wound VAC with debridement done later this week.  Currently patient notes that the area is painful and she is also tearful.  She was sent by her nursing facility who felt that the infection had progressed.  Current Facility-Administered Medications  Medication Dose Route Frequency Provider Last Rate Last Admin   0.9 %  sodium chloride infusion   Intravenous Continuous Adefeso, Oladapo, DO 100 mL/hr at 05/02/22 1631 New Bag at 05/02/22 1631   acetaminophen (TYLENOL) tablet 650 mg  650 mg Oral Q6H PRN Adefeso, Oladapo, DO       Or   acetaminophen (TYLENOL) suppository 650 mg  650 mg Rectal Q6H PRN Adefeso, Oladapo, DO       aspirin EC tablet 81 mg  81 mg Oral Daily Adefeso, Oladapo, DO   81 mg at 05/02/22 1807   atorvastatin (LIPITOR) tablet 80 mg  80 mg Oral QHS Adefeso, Oladapo, DO       ceFEPIme (MAXIPIME) 2 g in sodium chloride 0.9 % 100 mL IVPB  2 g Intravenous Q8H Beers, Shanon Brow, RPH       [START ON 05/03/2022] feeding supplement (ENSURE ENLIVE / ENSURE PLUS) liquid 237 mL  237 mL Oral BID BM Adefeso, Oladapo, DO        [START ON 05/03/2022] ferrous sulfate tablet 325 mg  325 mg Oral Daily Adefeso, Oladapo, DO       heparin injection 5,000 Units  5,000 Units Subcutaneous Q8H Adefeso, Oladapo, DO   5,000 Units at 05/02/22 1614   methocarbamol (ROBAXIN) tablet 500 mg  500 mg Oral Q8H PRN Adefeso, Oladapo, DO       mupirocin ointment (BACTROBAN) 2 % 1 Application  1 Application Nasal BID Adefeso, Oladapo, DO       ondansetron (ZOFRAN) tablet 4 mg  4 mg Oral Q6H PRN Adefeso, Oladapo, DO       Or   ondansetron (ZOFRAN) injection 4 mg  4 mg Intravenous Q6H PRN Adefeso, Oladapo, DO       oxyCODONE-acetaminophen (PERCOCET/ROXICET) 5-325 MG per tablet 1 tablet  1 tablet Oral Q4H PRN Adefeso, Oladapo, DO   1 tablet at 05/02/22 1651   [START ON 05/03/2022] pantoprazole (PROTONIX) EC tablet 40 mg  40 mg Oral Daily Adefeso, Oladapo, DO       [START ON 05/03/2022] vancomycin (VANCOREADY) IVPB 1500 mg/300 mL  1,500 mg Intravenous Q24H Beers, Shanon Brow, [facility]        Past Medical History:  Diagnosis Date   Abdominal aortic atherosclerosis ([place])    a. 05/2017 CTA abd/pelvis: significant atherosclerotic dzs of the infrarenal abd Ao w/ some mural thrombus. No aneurysm or  dissection.   Acute focal ischemia of small intestine (HCC)    Acute right-sided low back pain with right-sided sciatica 06/13/2017   AKI (acute kidney injury) ([place]) 07/29/2017   Anemia    Anginal pain ([place])    a. 08/2012 Lexiscan MV: EF 54%, non ischemia/infarct.   Anxiety and depression    Baker's cyst of knee, right    a. 07/2016 U/S: 4.1 x 1.4 x 2.9 cystic structure in R poplitetal fossa.   Bell's palsy    CAD (coronary artery disease)    CHF (congestive heart failure) ([place])    a.) TTE 06/20/2017: EF 60-65%, no rwma, G1DD, no source of cardiac emboli.   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 05/2017 CTA Abd/pelvis: apparent thrombus or embolus in prox SMA (70-90%); b. 05/2017 catheter directed tPA, mechanical thrombectomy, and stenting of the SMA.   Essential hypertension with goal blood pressure less than 140/90 01/19/2016   Gastroesophageal reflux disease 01/19/2016   GERD (gastroesophageal reflux disease)    H/O colectomy    History of 2019 novel coronavirus disease (COVID-19) 11/14/2019   History of kidney stones    Hyperlipidemia    Hypertension    Morbid obesity with BMI of 40.0-44.9, adult ([place])    NSTEMI (non-ST elevated myocardial infarction) ([employer]) 06/27/2021   a.) high sensitivity troponins trended: 893 --> 1587 --> 1748 --> 868 --> 735 ng/L   Occlusive mesenteric ischemia ([place]) 06/19/2017   Osteoarthritis    PAD (peripheral artery disease) (HCC)    PAF (paroxysmal atrial fibrillation) (HCC)    a.) CHA2DS2-VASc = 6 (sex, CHF, HTN, DVT x2, aortic plaque). b.) rate/rhythm maintained on oral metoprolol tartrate; chronically anticoagulated with standard dose apixaban.   Palpitations    Peripheral edema    Personal history of other malignant neoplasm of skin 06/01/2016   Pneumonia    Pre-diabetes    Primary osteoarthritis of both knees 06/13/2017   SBO (small bowel obstruction) ([place]) 08/06/2017   Skin cancer of nose    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HCC)    Vitamin D deficiency     Past Surgical History:  Procedure Laterality Date   AMPUTATION Right 04/06/2022   Procedure: AMPUTATION ABOVE KNEE;  Surgeon: Algernon Huxley, MD;  Location: [facility];  Service: Vascular;  Laterality: Right;   AMPUTATION TOE Left 12/09/2019   Procedure: AMPUTATION TOE MPJ left;  Surgeon: Caroline More, DPM;  Location: [facility];  Service: Podiatry;  Laterality: Left;   APPENDECTOMY     APPLICATION OF WOUND VAC  12/01/2021   Procedure: APPLICATION OF WOUND VAC;  Surgeon: Algernon Huxley, MD;  Location: [facility];  Service: Vascular;;    CHOLECYSTECTOMY     COLON SURGERY     COLONOSCOPY WITH PROPOFOL N/A 03/23/2022   Procedure: COLONOSCOPY WITH PROPOFOL;  Surgeon: Jonathon Bellows, MD;  Location: [facility];  Service: Gastroenterology;  Laterality: N/A;  rectal bleed   EMBOLECTOMY OR THROMBECTOMY, WITH OR WITHOUT CATHETER; FEMOROPOPLITEAL, AORTOILIAC ARTERY, BY LEG INCISION N/A 06/21/2020   Left - Decomp Fasciotomy Leg; Ant &/Or Lat Compart Only; Midline - Angiography, Visceral, Selective Or Supraselective (With Or Without Flush Aortogram); Left - Revascularize, Endovasc, Open/Percut, Iliac Artery, Unilat, Initial Vessel; W/Translum Stent, W/Angioplasty; Right - Revascularize, Endovasc,  Open/Percut, Iliac Artery, Ea Add`L Ipsilateral; W/Translumin Stent, W/Angioplasty; Location [facility];   ENDARTERECTOMY FEMORAL Left 12/01/2021   Procedure: ENDARTERECTOMY FEMORAL;  Surgeon: Algernon Huxley, MD;  Location: [facility];  Service: Vascular;  Laterality: Left;   ENDOVASCULAR REPAIR/STENT GRAFT Left 12/01/2021   Procedure: ENDOVASCULAR REPAIR/STENT GRAFT;  Surgeon: Algernon Huxley, MD;  Location: [place] CV LAB;  Service: Cardiovascular;  Laterality: Left;   LAPAROTOMY N/A 08/08/2017   Procedure: EXPLORATORY LAPAROTOMY POSSIBLE BOWEL RESECTION;  Surgeon: Jules Husbands, MD;  Location: [facility];  Service: General;  Laterality: N/A;   LEFT HEART CATH AND CORONARY ANGIOGRAPHY N/A 05/17/2021   Procedure: LEFT HEART CATH AND CORONARY ANGIOGRAPHY;  Surgeon: Yolonda Kida, MD;  Location: [place] [facility];  Service: Cardiovascular;  Laterality: N/A;   LOWER EXTREMITY ANGIOGRAPHY Left 11/17/2019   Procedure: LOWER EXTREMITY ANGIOGRAPHY;  Surgeon: Algernon Huxley, MD;  Location: [facility];  Service: Cardiovascular;  Laterality: Left;   LOWER EXTREMITY ANGIOGRAPHY Right 12/13/2020   Procedure: LOWER EXTREMITY ANGIOGRAPHY;  Surgeon: Algernon Huxley, MD;  Location: [place] [facility];  Service: Cardiovascular;  Laterality: Right;   LOWER EXTREMITY  ANGIOGRAPHY Right 01/10/2021   Procedure: LOWER EXTREMITY ANGIOGRAPHY;  Surgeon: Algernon Huxley, MD;  Location: [place] [facility];  Service: Cardiovascular;  Laterality: Right;   LOWER EXTREMITY ANGIOGRAPHY Right 01/24/2021   Procedure: LOWER EXTREMITY ANGIOGRAPHY;  Surgeon: Algernon Huxley, MD;  Location: [place] [facility];  Service: Cardiovascular;  Laterality: Right;   LOWER EXTREMITY ANGIOGRAPHY Bilateral 06/28/2021   Procedure: Lower Extremity Angiography;  Surgeon: Algernon Huxley, MD;  Location: [place] [facility];  Service: Cardiovascular;  Laterality: Bilateral;   LOWER EXTREMITY ANGIOGRAPHY Right 11/28/2021   Procedure: LOWER EXTREMITY ANGIOGRAPHY;  Surgeon: Algernon Huxley, MD;  Location: [place] [facility];  Service: Cardiovascular;  Laterality: Right;   LOWER EXTREMITY ANGIOGRAPHY Left 11/30/2021   Procedure: Lower Extremity Angiography;  Surgeon: Algernon Huxley, MD;  Location: [place] [facility];  Service: Cardiovascular;  Laterality: Left;   LOWER EXTREMITY ANGIOGRAPHY Right 02/22/2022   Procedure: Lower Extremity Angiography;  Surgeon: Algernon Huxley, MD;  Location: [place] [facility];  Service: Cardiovascular;  Laterality: Right;   LOWER EXTREMITY ANGIOGRAPHY Right 03/13/2022   Procedure: Lower Extremity Angiography;  Surgeon: Algernon Huxley, MD;  Location: [place] [facility];  Service: Cardiovascular;  Laterality: Right;   LOWER EXTREMITY ANGIOGRAPHY Right 03/24/2022   Procedure: Lower Extremity Angiography;  Surgeon: Algernon Huxley, MD;  Location: [place] [facility];  Service: Cardiovascular;  Laterality: Right;   LOWER EXTREMITY INTERVENTION N/A 11/19/2019   Procedure: LOWER EXTREMITY INTERVENTION;  Surgeon: Algernon Huxley, MD;  Location: [place] [facility];  Service: Cardiovascular;  Laterality: N/A;   LOWER EXTREMITY INTERVENTION Bilateral 06/29/2021   Procedure: LOWER EXTREMITY INTERVENTION;  Surgeon: Algernon Huxley, MD;  Location: [place] [facility];  Service: Cardiovascular;   Laterality: Bilateral;   TEE WITHOUT CARDIOVERSION N/A 06/22/2017   Procedure: TRANSESOPHAGEAL ECHOCARDIOGRAM (TEE);  Surgeon: Wellington Hampshire, MD;  Location: [facility];  Service: Cardiovascular;  Laterality: N/A;   TOTAL KNEE ARTHROPLASTY Right 07/11/2018   Procedure: TOTAL KNEE ARTHROPLASTY;  Surgeon: Corky Mull, MD;  Location: [facility];  Service: Orthopedics;  Laterality: Right;   VAGINAL HYSTERECTOMY     VISCERAL ARTERY INTERVENTION N/A 06/20/2017   Procedure: Visceral Artery Intervention, possible aortic thrombectomy;  Surgeon: Algernon Huxley, MD;  Location: [place] INVASIVE CV  LAB;  Service: Cardiovascular;  Laterality: N/A;   VISCERAL ARTERY INTERVENTION N/A 01/28/2018   Procedure: VISCERAL ARTERY INTERVENTION;  Surgeon: Algernon Huxley, MD;  Location: [place] [facility];  Service: Cardiovascular;  Laterality: N/A;    Social History Social History   Tobacco Use   Smoking status: Former    Packs/day: 1.00    Types: Cigarettes    Quit date: 2023    Years since quitting: 0.5   Smokeless tobacco: Never  Vaping Use   Vaping Use: Never used  Substance Use Topics   Alcohol use: No   Drug use: No    Family History Family History  Problem Relation Age of Onset   Hypertension Mother    Heart disease Mother    Heart attack Mother    Breast cancer Mother    Hypertension Father    Breast cancer Paternal Aunt     Allergies  Allergen Reactions   Gabapentin     Tremors    Bactrim [Sulfamethoxazole-Trimethoprim] Itching     REVIEW OF SYSTEMS (Negative unless checked)  Constitutional: '[]'$ Weight loss  '[]'$ Fever  '[]'$ Chills Cardiac: '[]'$ Chest pain   '[]'$ Chest pressure   '[]'$ Palpitations   '[]'$ Shortness of breath when laying flat   '[]'$ Shortness of breath at rest   '[]'$ Shortness of breath with exertion. Vascular:  '[]'$ Pain in legs with walking   '[]'$ Pain in legs at rest   '[]'$ Pain in legs when laying flat   '[]'$ Claudication   '[]'$ Pain in feet when walking  '[]'$ Pain in feet at rest  '[]'$ Pain in feet when laying  flat   '[]'$ History of DVT   '[]'$ Phlebitis   '[]'$ Swelling in legs   '[]'$ Varicose veins   '[]'$ Non-healing ulcers Pulmonary:   '[]'$ Uses home oxygen   '[]'$ Productive cough   '[]'$ Hemoptysis   '[]'$ Wheeze  '[]'$ COPD   '[]'$ Asthma Neurologic:  '[]'$ Dizziness  '[]'$ Blackouts   '[]'$ Seizures   '[]'$ History of stroke   '[]'$ History of TIA  '[]'$ Aphasia   '[]'$ Temporary blindness   '[]'$ Dysphagia   '[]'$ Weakness or numbness in arms   '[]'$ Weakness or numbness in legs Musculoskeletal:  '[]'$ Arthritis   '[]'$ Joint swelling   '[]'$ Joint pain   '[]'$ Low back pain Hematologic:  '[]'$ Easy bruising  '[]'$ Easy bleeding   '[]'$ Hypercoagulable state   '[]'$ Anemic  '[]'$ Hepatitis Gastrointestinal:  '[]'$ Blood in stool   '[]'$ Vomiting blood  '[]'$ Gastroesophageal reflux/heartburn   '[]'$ Difficulty swallowing. Genitourinary:  '[]'$ Chronic kidney disease   '[]'$ Difficult urination  '[]'$ Frequent urination  '[]'$ Burning with urination   '[]'$ Blood in urine Skin:  '[]'$ Rashes   '[]'$ Ulcers   '[x]'$ Wounds Psychological:  '[x]'$ History of anxiety   '[]'$  History of major depression.  Physical Examination  Vitals:   05/02/22 1528 05/02/22 1613 05/02/22 1629 05/02/22 1943  BP: (!) 106/57 (!) 98/59  (!) 107/43  Pulse: (!) 57 61  67  Resp: 16   18  Temp: 98 F (36.7 C) 97.8 F (36.6 C)  97.8 F (36.6 C)  TempSrc:      SpO2: 100% 98%  97%  Weight:   99.8 kg   Height:   '5\' 2"'$  (1.575 m)    Body mass index is 40.24 kg/m. Gen:  WD/WN, NAD Head: [place]/AT, No temporalis wasting. Prominent temp pulse not noted. Ear/Nose/Throat: Hearing grossly intact, nares w/o erythema or drainage, oropharynx w/o Erythema/Exudate Eyes: Sclera non-icteric, conjunctiva clear Neck: Trachea midline.  No JVD.  Pulmonary:  Good air movement, respirations not labored, equal bilaterally.  Cardiac: RRR, normal S1, S2. Vascular: Large right below-knee amputation stump wound  Gastrointestinal: soft, non-tender/non-distended. No guarding/reflex.  Neurologic:  Sensation grossly intact in extremities.  Symmetrical.  Speech is fluent. Motor exam as listed  above. Psychiatric: Anxious and tearful    CBC Lab Results  Component Value Date   WBC 7.4 05/02/2022   HGB 9.4 (L) 05/02/2022   HCT 31.2 (L) 05/02/2022   MCV 88.1 05/02/2022   PLT 282 05/02/2022    BMET    Component Value Date/Time   NA 140 05/02/2022 1158   NA 140 02/12/2015 1137   K 4.0 05/02/2022 1158   K 2.9 (L) 02/12/2015 1137   CL 106 05/02/2022 1158   CL 107 02/12/2015 1137   CO2 26 05/02/2022 1158   CO2 27 02/12/2015 1137   GLUCOSE 103 (H) 05/02/2022 1158   GLUCOSE 120 (H) 02/12/2015 1137   BUN 12 05/02/2022 1158   BUN 15 02/12/2015 1137   CREATININE 0.84 05/02/2022 1158   CREATININE 0.78 02/12/2015 1137   CALCIUM 8.7 (L) 05/02/2022 1158   CALCIUM 9.0 02/12/2015 1137   GFRNONAA >60 05/02/2022 1158   GFRNONAA >60 02/12/2015 1137   GFRAA >60 11/23/2019 1330   GFRAA >60 02/12/2015 1137   Estimated Creatinine Clearance: 76.7 mL/min (by C-G formula based on SCr of 0.84 mg/dL).  COAG Lab Results  Component Value Date   INR 1.3 (H) 05/02/2022   INR 0.9 06/27/2021   INR 1.1 11/23/2019    Radiology DG Chest Port 1 View  Result Date: 05/02/2022 CLINICAL DATA:  Suspected sepsis EXAM: PORTABLE CHEST 1 VIEW COMPARISON:  02/03/2022 FINDINGS: The heart size and mediastinal contours are within normal limits. Both lungs are clear. The visualized skeletal structures are unremarkable. IMPRESSION: No active disease. Electronically Signed   By: Davina Poke D.O.   On: 05/02/2022 12:26      Assessment/Plan 1.  Wound dehiscence The patient does have a fairly significant wound dehiscence which will require debridement with wound VAC placement.  This was previously discussed with patient while she was in the office.  We also discussed again.  Patient is agreeable to proceed.   Family Communication: None at bedside  Thank you for allowing [place] to participate in the care of this patient.   Kris Hartmann, NP [facility] [phone] (Office  Phone) [phone] (Office Fax) [phone] (Pager)  05/02/2022 10:33 PM  Staff may message me via secure chat in [place]  but this may not receive immediate response,  please page for urgent matters!  Dictation software was used to generate the above note. Typos may occur and escape review, as with typed/written notes. Any error is purely unintentional.  Please contact me directly for clarity if needed.

## 2022-05-02 NOTE — H&P (View-Only) (Signed)
[facility] Vascular Consult Note  MRN : [id number]  Carla [name] is a 61 y.o. (03-01-1961) female who presents with chief complaint of  Chief Complaint  Patient presents with   Leg Pain  .   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  At patient's initial wound evaluation the incision was reddened with some drainage and this wound was cultured.  The culture returned positive for Pseudomonas and her nursing facility was contacted prior to begin ciprofloxacin 750 mg.  At her most recent follow-up it was noted that the wound had dehisced further.  Plans were discussed with the patient to have a wound VAC with debridement done later this week.  Currently patient notes that the area is painful and she is also tearful.  She was sent by her nursing facility who felt that the infection had progressed.  Current Facility-Administered Medications  Medication Dose Route Frequency Provider Last Rate Last Admin   0.9 %  sodium chloride infusion   Intravenous Continuous Adefeso, Oladapo, DO 100 mL/hr at 05/02/22 1631 New Bag at 05/02/22 1631   acetaminophen (TYLENOL) tablet 650 mg  650 mg Oral Q6H PRN Adefeso, Oladapo, DO       Or   acetaminophen (TYLENOL) suppository 650 mg  650 mg Rectal Q6H PRN Adefeso, Oladapo, DO       aspirin EC tablet 81 mg  81 mg Oral Daily Adefeso, Oladapo, DO   81 mg at 05/02/22 1807   atorvastatin (LIPITOR) tablet 80 mg  80 mg Oral QHS Adefeso, Oladapo, DO       ceFEPIme (MAXIPIME) 2 g in sodium chloride 0.9 % 100 mL IVPB  2 g Intravenous Q8H Beers, Shanon Brow, RPH       [START ON 05/03/2022] feeding supplement (ENSURE ENLIVE / ENSURE PLUS) liquid 237 mL  237 mL Oral BID BM Adefeso, Oladapo, DO        [START ON 05/03/2022] ferrous sulfate tablet 325 mg  325 mg Oral Daily Adefeso, Oladapo, DO       heparin injection 5,000 Units  5,000 Units Subcutaneous Q8H Adefeso, Oladapo, DO   5,000 Units at 05/02/22 1614   methocarbamol (ROBAXIN) tablet 500 mg  500 mg Oral Q8H PRN Adefeso, Oladapo, DO       mupirocin ointment (BACTROBAN) 2 % 1 Application  1 Application Nasal BID Adefeso, Oladapo, DO       ondansetron (ZOFRAN) tablet 4 mg  4 mg Oral Q6H PRN Adefeso, Oladapo, DO       Or   ondansetron (ZOFRAN) injection 4 mg  4 mg Intravenous Q6H PRN Adefeso, Oladapo, DO       oxyCODONE-acetaminophen (PERCOCET/ROXICET) 5-325 MG per tablet 1 tablet  1 tablet Oral Q4H PRN Adefeso, Oladapo, DO   1 tablet at 05/02/22 1651   [START ON 05/03/2022] pantoprazole (PROTONIX) EC tablet 40 mg  40 mg Oral Daily Adefeso, Oladapo, DO       [START ON 05/03/2022] vancomycin (VANCOREADY) IVPB 1500 mg/300 mL  1,500 mg Intravenous Q24H Beers, Shanon Brow, [facility]        Past Medical History:  Diagnosis Date   Abdominal aortic atherosclerosis ([place])    a. 05/2017 CTA abd/pelvis: significant atherosclerotic dzs of the infrarenal abd Ao w/ some mural thrombus. No aneurysm or  dissection.   Acute focal ischemia of small intestine (HCC)    Acute right-sided low back pain with right-sided sciatica 06/13/2017   AKI (acute kidney injury) ([place]) 07/29/2017   Anemia    Anginal pain ([place])    a. 08/2012 Lexiscan MV: EF 54%, non ischemia/infarct.   Anxiety and depression    Baker's cyst of knee, right    a. 07/2016 U/S: 4.1 x 1.4 x 2.9 cystic structure in R poplitetal fossa.   Bell's palsy    CAD (coronary artery disease)    CHF (congestive heart failure) ([place])    a.) TTE 06/20/2017: EF 60-65%, no rwma, G1DD, no source of cardiac emboli.   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 05/2017 CTA Abd/pelvis: apparent thrombus or embolus in prox SMA (70-90%); b. 05/2017 catheter directed tPA, mechanical thrombectomy, and stenting of the SMA.   Essential hypertension with goal blood pressure less than 140/90 01/19/2016   Gastroesophageal reflux disease 01/19/2016   GERD (gastroesophageal reflux disease)    H/O colectomy    History of 2019 novel coronavirus disease (COVID-19) 11/14/2019   History of kidney stones    Hyperlipidemia    Hypertension    Morbid obesity with BMI of 40.0-44.9, adult ([place])    NSTEMI (non-ST elevated myocardial infarction) ([place]) 06/27/2021   a.) high sensitivity troponins trended: 893 --> 1587 --> 1748 --> 868 --> 735 ng/L   Occlusive mesenteric ischemia ([place]) 06/19/2017   Osteoarthritis    PAD (peripheral artery disease) (HCC)    PAF (paroxysmal atrial fibrillation) (HCC)    a.) CHA2DS2-VASc = 6 (sex, CHF, HTN, DVT x2, aortic plaque). b.) rate/rhythm maintained on oral metoprolol tartrate; chronically anticoagulated with standard dose apixaban.   Palpitations    Peripheral edema    Personal history of other malignant neoplasm of skin 06/01/2016   Pneumonia    Pre-diabetes    Primary osteoarthritis of both knees 06/13/2017   SBO (small bowel obstruction) ([place]) 08/06/2017   Skin cancer of nose    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HCC)    Vitamin D deficiency     Past Surgical History:  Procedure Laterality Date   AMPUTATION Right 04/06/2022   Procedure: AMPUTATION ABOVE KNEE;  Surgeon: Algernon Huxley, MD;  Location: [facility];  Service: Vascular;  Laterality: Right;   AMPUTATION TOE Left 12/09/2019   Procedure: AMPUTATION TOE MPJ left;  Surgeon: Caroline More, DPM;  Location: [facility];  Service: Podiatry;  Laterality: Left;   APPENDECTOMY     APPLICATION OF WOUND VAC  12/01/2021   Procedure: APPLICATION OF WOUND VAC;  Surgeon: Algernon Huxley, MD;  Location: [facility];  Service: Vascular;;    CHOLECYSTECTOMY     COLON SURGERY     COLONOSCOPY WITH PROPOFOL N/A 03/23/2022   Procedure: COLONOSCOPY WITH PROPOFOL;  Surgeon: Jonathon Bellows, MD;  [facility];  Service: Gastroenterology;  Laterality: N/A;  rectal bleed   EMBOLECTOMY OR THROMBECTOMY, WITH OR WITHOUT CATHETER; FEMOROPOPLITEAL, AORTOILIAC ARTERY, BY LEG INCISION N/A 06/21/2020   Left - Decomp Fasciotomy Leg; Ant &/Or Lat Compart Only; Midline - Angiography, Visceral, Selective Or Supraselective (With Or Without Flush Aortogram); Left - Revascularize, Endovasc, Open/Percut, Iliac Artery, Unilat, Initial Vessel; W/Translum Stent, W/Angioplasty; Right - Revascularize, Endovasc,  Open/Percut, Iliac Artery, Ea Add`L Ipsilateral; W/Translumin Stent, W/Angioplasty; Location [facility];   ENDARTERECTOMY FEMORAL Left 12/01/2021   Procedure: ENDARTERECTOMY FEMORAL;  Surgeon: Algernon Huxley, MD;  Location: [facility];  Service: Vascular;  Laterality: Left;   ENDOVASCULAR REPAIR/STENT GRAFT Left 12/01/2021   Procedure: ENDOVASCULAR REPAIR/STENT GRAFT;  Surgeon: Algernon Huxley, MD;  Location: [place] CV LAB;  Service: Cardiovascular;  Laterality: Left;   LAPAROTOMY N/A 08/08/2017   Procedure: EXPLORATORY LAPAROTOMY POSSIBLE BOWEL RESECTION;  Surgeon: Jules Husbands, MD;  Location: [facility];  Service: General;  Laterality: N/A;   LEFT HEART CATH AND CORONARY ANGIOGRAPHY N/A 05/17/2021   Procedure: LEFT HEART CATH AND CORONARY ANGIOGRAPHY;  Surgeon: Yolonda Kida, MD;  Location: [place] [facility];  Service: Cardiovascular;  Laterality: N/A;   LOWER EXTREMITY ANGIOGRAPHY Left 11/17/2019   Procedure: LOWER EXTREMITY ANGIOGRAPHY;  Surgeon: Algernon Huxley, MD;  Location: [place] [facility];  Service: Cardiovascular;  Laterality: Left;   LOWER EXTREMITY ANGIOGRAPHY Right 12/13/2020   Procedure: LOWER EXTREMITY ANGIOGRAPHY;  Surgeon: Algernon Huxley, MD;  Location: [place] [facility];  Service: Cardiovascular;  Laterality: Right;   LOWER EXTREMITY  ANGIOGRAPHY Right 01/10/2021   Procedure: LOWER EXTREMITY ANGIOGRAPHY;  Surgeon: Algernon Huxley, MD;  Location: [place] [facility];  Service: Cardiovascular;  Laterality: Right;   LOWER EXTREMITY ANGIOGRAPHY Right 01/24/2021   Procedure: LOWER EXTREMITY ANGIOGRAPHY;  Surgeon: Algernon Huxley, MD;  Location: [place] [facility];  Service: Cardiovascular;  Laterality: Right;   LOWER EXTREMITY ANGIOGRAPHY Bilateral 06/28/2021   Procedure: Lower Extremity Angiography;  Surgeon: Algernon Huxley, MD;  Location: [place] [facility];  Service: Cardiovascular;  Laterality: Bilateral;   LOWER EXTREMITY ANGIOGRAPHY Right 11/28/2021   Procedure: LOWER EXTREMITY ANGIOGRAPHY;  Surgeon: Algernon Huxley, MD;  Location: [place] [facility];  Service: Cardiovascular;  Laterality: Right;   LOWER EXTREMITY ANGIOGRAPHY Left 11/30/2021   Procedure: Lower Extremity Angiography;  Surgeon: Algernon Huxley, MD;  Location: [place] CV [facility];  Service: Cardiovascular;  Laterality: Left;   LOWER EXTREMITY ANGIOGRAPHY Right 02/22/2022   Procedure: Lower Extremity Angiography;  Surgeon: Algernon Huxley, MD;  Location: [place] [facility];  Service: Cardiovascular;  Laterality: Right;   LOWER EXTREMITY ANGIOGRAPHY Right 03/13/2022   Procedure: Lower Extremity Angiography;  Surgeon: Algernon Huxley, MD;  Location: [place] [facility];  Service: Cardiovascular;  Laterality: Right;   LOWER EXTREMITY ANGIOGRAPHY Right 03/24/2022   Procedure: Lower Extremity Angiography;  Surgeon: Algernon Huxley, MD;  Location: [place] [facility];  Service: Cardiovascular;  Laterality: Right;   LOWER EXTREMITY INTERVENTION N/A 11/19/2019   Procedure: LOWER EXTREMITY INTERVENTION;  Surgeon: Algernon Huxley, MD;  Location: [place] [facility];  Service: Cardiovascular;  Laterality: N/A;   LOWER EXTREMITY INTERVENTION Bilateral 06/29/2021   Procedure: LOWER EXTREMITY INTERVENTION;  Surgeon: Algernon Huxley, MD;  Location: [place] [facility];  Service: Cardiovascular;   Laterality: Bilateral;   TEE WITHOUT CARDIOVERSION N/A 06/22/2017   Procedure: TRANSESOPHAGEAL ECHOCARDIOGRAM (TEE);  Surgeon: Wellington Hampshire, MD;  Location: [facility];  Service: Cardiovascular;  Laterality: N/A;   TOTAL KNEE ARTHROPLASTY Right 07/11/2018   Procedure: TOTAL KNEE ARTHROPLASTY;  Surgeon: Corky Mull, MD;  Location: [facility];  Service: Orthopedics;  Laterality: Right;   VAGINAL HYSTERECTOMY     VISCERAL ARTERY INTERVENTION N/A 06/20/2017   Procedure: Visceral Artery Intervention, possible aortic thrombectomy;  Surgeon: Algernon Huxley, MD;  Location: [place] INVASIVE CV  LAB;  Service: Cardiovascular;  Laterality: N/A;   VISCERAL ARTERY INTERVENTION N/A 01/28/2018   Procedure: VISCERAL ARTERY INTERVENTION;  Surgeon: Algernon Huxley, MD;  Location: [place] [facility];  Service: Cardiovascular;  Laterality: N/A;    Social History Social History   Tobacco Use   Smoking status: Former    Packs/day: 1.00    Types: Cigarettes    Quit date: 2023    Years since quitting: 0.5   Smokeless tobacco: Never  Vaping Use   Vaping Use: Never used  Substance Use Topics   Alcohol use: No   Drug use: No    Family History Family History  Problem Relation Age of Onset   Hypertension Mother    Heart disease Mother    Heart attack Mother    Breast cancer Mother    Hypertension Father    Breast cancer Paternal Aunt     Allergies  Allergen Reactions   Gabapentin     Tremors    Bactrim [Sulfamethoxazole-Trimethoprim] Itching     REVIEW OF SYSTEMS (Negative unless checked)  Constitutional: '[]'$ Weight loss  '[]'$ Fever  '[]'$ Chills Cardiac: '[]'$ Chest pain   '[]'$ Chest pressure   '[]'$ Palpitations   '[]'$ Shortness of breath when laying flat   '[]'$ Shortness of breath at rest   '[]'$ Shortness of breath with exertion. Vascular:  '[]'$ Pain in legs with walking   '[]'$ Pain in legs at rest   '[]'$ Pain in legs when laying flat   '[]'$ Claudication   '[]'$ Pain in feet when walking  '[]'$ Pain in feet at rest  '[]'$ Pain in feet when laying  flat   '[]'$ History of DVT   '[]'$ Phlebitis   '[]'$ Swelling in legs   '[]'$ Varicose veins   '[]'$ Non-healing ulcers Pulmonary:   '[]'$ Uses home oxygen   '[]'$ Productive cough   '[]'$ Hemoptysis   '[]'$ Wheeze  '[]'$ COPD   '[]'$ Asthma Neurologic:  '[]'$ Dizziness  '[]'$ Blackouts   '[]'$ Seizures   '[]'$ History of stroke   '[]'$ History of TIA  '[]'$ Aphasia   '[]'$ Temporary blindness   '[]'$ Dysphagia   '[]'$ Weakness or numbness in arms   '[]'$ Weakness or numbness in legs Musculoskeletal:  '[]'$ Arthritis   '[]'$ Joint swelling   '[]'$ Joint pain   '[]'$ Low back pain Hematologic:  '[]'$ Easy bruising  '[]'$ Easy bleeding   '[]'$ Hypercoagulable state   '[]'$ Anemic  '[]'$ Hepatitis Gastrointestinal:  '[]'$ Blood in stool   '[]'$ Vomiting blood  '[]'$ Gastroesophageal reflux/heartburn   '[]'$ Difficulty swallowing. Genitourinary:  '[]'$ Chronic kidney disease   '[]'$ Difficult urination  '[]'$ Frequent urination  '[]'$ Burning with urination   '[]'$ Blood in urine Skin:  '[]'$ Rashes   '[]'$ Ulcers   '[x]'$ Wounds Psychological:  '[x]'$ History of anxiety   '[]'$  History of major depression.  Physical Examination  Vitals:   05/02/22 1528 05/02/22 1613 05/02/22 1629 05/02/22 1943  BP: (!) 106/57 (!) 98/59  (!) 107/43  Pulse: (!) 57 61  67  Resp: 16   18  Temp: 98 F (36.7 C) 97.8 F (36.6 C)  97.8 F (36.6 C)  TempSrc:      SpO2: 100% 98%  97%  Weight:   99.8 kg   Height:   '5\' 2"'$  (1.575 m)    Body mass index is 40.24 kg/m. Gen:  WD/WN, NAD Head: [place]/AT, No temporalis wasting. Prominent temp pulse not noted. Ear/Nose/Throat: Hearing grossly intact, nares w/o erythema or drainage, oropharynx w/o Erythema/Exudate Eyes: Sclera non-icteric, conjunctiva clear Neck: Trachea midline.  No JVD.  Pulmonary:  Good air movement, respirations not labored, equal bilaterally.  Cardiac: RRR, normal S1, S2. Vascular: Large right below-knee amputation stump wound  Gastrointestinal: soft, non-tender/non-distended. No guarding/reflex.  Neurologic:  Sensation grossly intact in extremities.  Symmetrical.  Speech is fluent. Motor exam as listed  above. Psychiatric: Anxious and tearful    CBC Lab Results  Component Value Date   WBC 7.4 05/02/2022   HGB 9.4 (L) 05/02/2022   HCT 31.2 (L) 05/02/2022   MCV 88.1 05/02/2022   PLT 282 05/02/2022    BMET    Component Value Date/Time   NA 140 05/02/2022 1158   NA 140 02/12/2015 1137   K 4.0 05/02/2022 1158   K 2.9 (L) 02/12/2015 1137   CL 106 05/02/2022 1158   CL 107 02/12/2015 1137   CO2 26 05/02/2022 1158   CO2 27 02/12/2015 1137   GLUCOSE 103 (H) 05/02/2022 1158   GLUCOSE 120 (H) 02/12/2015 1137   BUN 12 05/02/2022 1158   BUN 15 02/12/2015 1137   CREATININE 0.84 05/02/2022 1158   CREATININE 0.78 02/12/2015 1137   CALCIUM 8.7 (L) 05/02/2022 1158   CALCIUM 9.0 02/12/2015 1137   GFRNONAA >60 05/02/2022 1158   GFRNONAA >60 02/12/2015 1137   GFRAA >60 11/23/2019 1330   GFRAA >60 02/12/2015 1137   Estimated Creatinine Clearance: 76.7 mL/min (by C-G formula based on SCr of 0.84 mg/dL).  COAG Lab Results  Component Value Date   INR 1.3 (H) 05/02/2022   INR 0.9 06/27/2021   INR 1.1 11/23/2019    Radiology DG Chest Port 1 View  Result Date: 05/02/2022 CLINICAL DATA:  Suspected sepsis EXAM: PORTABLE CHEST 1 VIEW COMPARISON:  02/03/2022 FINDINGS: The heart size and mediastinal contours are within normal limits. Both lungs are clear. The visualized skeletal structures are unremarkable. IMPRESSION: No active disease. Electronically Signed   By: Davina Poke D.O.   On: 05/02/2022 12:26      Assessment/Plan 1.  Wound dehiscence The patient does have a fairly significant wound dehiscence which will require debridement with wound VAC placement.  This was previously discussed with patient while she was in the office.  We also discussed again.  Patient is agreeable to proceed.   Family Communication: None at bedside  Thank you for allowing [place] to participate in the care of this patient.   Kris Hartmann, NP  Vein and Vascular Surgery [phone] (Office  Phone) [phone] (Office Fax) [phone] (Pager)  05/02/2022 10:33 PM  Staff may message me via secure chat in [place]  but this may not receive immediate response,  please page for urgent matters!  Dictation software was used to generate the above note. Typos may occur and escape review, as with typed/written notes. Any error is purely unintentional.  Please contact me directly for clarity if needed.

## 2022-05-02 NOTE — ED Provider Notes (Signed)
[facility] Provider Note    Event Date/Time   First MD Initiated Contact with Patient 05/02/22 1220     (approximate)   History   Leg Pain   HPI Carla [name] is a 61 y.o. female  who, per vascular surgery op note dated 04/06/22 had right leg rest pain with irreversible ischemia who underwent AKA, who presents to the emergency department today because of concern for infection at the amputation site.  The patient had been seen at vascular clinic roughly 1 week ago and started on oral antibiotics.  Had a recheck yesterday and it appeared the infection was getting worse.  Was told that she would likely need surgical intervention.  Family states that they were sent back to their rehab facility to await call for when they should come back however given that it continued to get worse today patient was brought to the emergency department.  Physical Exam   Triage Vital Signs: ED Triage Vitals [05/02/22 1149]  Enc Vitals Group     BP (!) 95/50     Pulse Rate 65     Resp 17     Temp 99.3 F (37.4 C)     Temp Source Oral     SpO2 (!) 87 %     Weight      Height      Head Circumference      Peak Flow      Pain Score 10     Pain Loc      Pain Edu?      Excl. in [place]?     Most recent vital signs: Vitals:   05/02/22 1149  BP: (!) 95/50  Pulse: 65  Resp: 17  Temp: 99.3 F (37.4 C)  SpO2: (!) 87%   General: Awake, alert, oriented. CV:  Good peripheral perfusion. Regular rate and rhythm. Resp:  Normal effort. Lungs clear. Abd:  No distention.  Other:  Surgical amputation site of right AKA with dehiscence, pus and foul odor.    ED Results / Procedures / Treatments   Labs (all labs ordered are listed, but only abnormal results are displayed) Labs Reviewed  COMPREHENSIVE METABOLIC PANEL - Abnormal; Notable for the following components:      Result Value   Glucose, Bld 103 (*)    Calcium 8.7 (*)    Total Protein 6.4 (*)    Albumin 2.8 (*)    All  other components within normal limits  LACTIC ACID, PLASMA - Abnormal; Notable for the following components:   Lactic Acid, Venous 2.2 (*)    All other components within normal limits  CBC WITH DIFFERENTIAL/PLATELET - Abnormal; Notable for the following components:   RBC 3.54 (*)    Hemoglobin 9.4 (*)    HCT 31.2 (*)    RDW 19.9 (*)    All other components within normal limits  PROTIME-INR - Abnormal; Notable for the following components:   Prothrombin Time 15.9 (*)    INR 1.3 (*)    All other components within normal limits  CULTURE, BLOOD (ROUTINE X 2)  CULTURE, BLOOD (ROUTINE X 2)  SARS CORONAVIRUS 2 BY RT PCR  LACTIC ACID, PLASMA  URINALYSIS, ROUTINE W REFLEX MICROSCOPIC     EKG  None   RADIOLOGY I independently interpreted and visualized the CXR. My interpretation: No pneumonia. Radiology interpretation:  IMPRESSION:  No active disease.    PROCEDURES:  Critical Care performed: No  Procedures   MEDICATIONS ORDERED IN ED:  Medications - No data to display   IMPRESSION / MDM / [place] / ED COURSE  I reviewed the triage vital signs and the nursing notes.                              Differential diagnosis includes, but is not limited to, infection, post operative changes.  Patient's presentation is most consistent with acute presentation with potential threat to life or bodily function.  Patient presented to the emergency department today because of concerns for wound to the the wound at patient's right AKA.  On exam it is dehisced with signs of infection.  Patient had a slight leukocytosis.  Patient was started on IV antibiotics.  I discussed with Dr. Lucky Cowboy with vascular surgery.  Additionally discussed with Dr. Josephine Cables with the hospitalist service who will plan on admission.  FINAL CLINICAL IMPRESSION(S) / ED DIAGNOSES   Final diagnoses:  Wound infection      Note:  This document was prepared using Dragon voice recognition software and may  include unintentional dictation errors.    Nance Pear, MD 05/02/22 [phone]

## 2022-05-02 NOTE — ED Triage Notes (Addendum)
Pt presents to ED with c/o of issues with R AKA. Pt states she had surgery for this AKA in June or July due to arterial issues. Pt denies fevers or chills. Pt is from [facility] for rehab purposes. Pt is A&Ox4 at this time.  Pt 87% on RA, 2L/min [place] placed.  1 Set of BC's sent and IV placed due to concerns for sepsis.

## 2022-05-02 NOTE — H&P (Signed)
History and Physical    Patient: Carla [name] DJM:[id number] DOB: Jun 28, 1961 DOA: 05/02/2022 DOS: the patient was seen and examined on 05/02/2022 PCP: [facility]  Patient coming from: SNF  Chief Complaint:  Chief Complaint  Patient presents with   Leg Pain   HPI: Carla [name] is a 61 y.o. female with medical history significant of  PAD s/p right AKA, hypertension, hyperlipidemia, iron deficiency anemia, CAD, GERD who presents to the emergency department via EMS from SNF due to wound at the stump of right AKA, she was seen at the vascular clinic about a week ago and was started on oral antibiotics.  She followed up yesterday and it was noted that the infection was worse and the patient may need surgical intervention.  Patient went back to rehab center, but due to ongoing worsening infection to wound site, EMS was activated and patient was sent to the ED for further evaluation and management.  She endorsed soft BP, but denies chest pain, fever, chills, nausea or vomiting. She was admitted from 6/15 to 6/21 due to right lower extremity irreversible ischemia and longstanding rest pain s/p right AKA.  ED Course:  In the emergency department, BP was soft at 95/50, O2 sat was 87% on room air, other vital signs were within normal range.  Work-up in the ED showed normocytic anemia, normal BMP, albumin 2.8, lactic acid 2.2 > 1.8.  SARS coronavirus 2 was negative. Chest x-ray showed no active disease. She was started on IV vancomycin and cefepime, IV fentanyl was given and IV hydration was provided.  Vascular surgeon (Dr. Lucky Cowboy) was consulted and will follow-up on consult with patient by ED physician.  Hospitalist was asked to admit patient for further evaluation and management.  Review of Systems: Review of systems as noted in the HPI. All other systems reviewed and are negative.   Past Medical History:  Diagnosis Date   Abdominal aortic atherosclerosis ([place])    a. 05/2017 CTA  abd/pelvis: significant atherosclerotic dzs of the infrarenal abd Ao w/ some mural thrombus. No aneurysm or dissection.   Acute focal ischemia of small intestine (HCC)    Acute right-sided low back pain with right-sided sciatica 06/13/2017   AKI (acute kidney injury) ([place]) 07/29/2017   Anemia    Anginal pain ([place])    a. 08/2012 Lexiscan MV: EF 54%, non ischemia/infarct.   Anxiety and depression    Baker's cyst of knee, right    a. 07/2016 U/S: 4.1 x 1.4 x 2.9 cystic structure in R poplitetal fossa.   Bell's palsy    CAD (coronary artery disease)    CHF (congestive heart failure) ([place])    a.) TTE 06/20/2017: EF 60-65%, no rwma, G1DD, no source of cardiac emboli.   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 05/2017 CTA Abd/pelvis: apparent thrombus or embolus in prox SMA (70-90%); b. 05/2017 catheter directed tPA, mechanical thrombectomy, and stenting of the SMA.   Essential hypertension with goal blood pressure less than 140/90 01/19/2016   Gastroesophageal reflux disease 01/19/2016   GERD (gastroesophageal reflux disease)    H/O colectomy    History of 2019 novel coronavirus disease (COVID-19) 11/14/2019   History of kidney stones    Hyperlipidemia  Hypertension    Morbid obesity with BMI of 40.0-44.9, adult (HCC)    NSTEMI (non-ST elevated myocardial infarction) ([place]) 06/27/2021   a.) high sensitivity troponins trended: 893 --> 1587 --> 1748 --> 868 --> 735 ng/L   Occlusive mesenteric ischemia (HCC) 06/19/2017   Osteoarthritis    PAD (peripheral artery disease) (HCC)    PAF (paroxysmal atrial fibrillation) (HCC)    a.) CHA2DS2-VASc = 6 (sex, CHF, HTN, DVT x2, aortic plaque). b.) rate/rhythm maintained on oral metoprolol tartrate; chronically anticoagulated with standard dose  apixaban.   Palpitations    Peripheral edema    Personal history of other malignant neoplasm of skin 06/01/2016   Pneumonia    Pre-diabetes    Primary osteoarthritis of both knees 06/13/2017   SBO (small bowel obstruction) ([place]) 08/06/2017   Skin cancer of nose    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HCC)    Vitamin D deficiency    Past Surgical History:  Procedure Laterality Date   AMPUTATION Right 04/06/2022   Procedure: AMPUTATION ABOVE KNEE;  Surgeon: Algernon Huxley, MD;  Location: [facility];  Service: Vascular;  Laterality: Right;   AMPUTATION TOE Left 12/09/2019   Procedure: AMPUTATION TOE MPJ left;  Surgeon: Caroline More, DPM;  Location: [facility];  Service: Podiatry;  Laterality: Left;   APPENDECTOMY     APPLICATION OF WOUND VAC  12/01/2021   Procedure: APPLICATION OF WOUND VAC;  Surgeon: Algernon Huxley, MD;  Location: [facility];  Service: Vascular;;   CHOLECYSTECTOMY     COLON SURGERY     COLONOSCOPY WITH PROPOFOL N/A 03/23/2022   Procedure: COLONOSCOPY WITH PROPOFOL;  Surgeon: Jonathon Bellows, MD;  Location: [facility];  Service: Gastroenterology;  Laterality: N/A;  rectal bleed   EMBOLECTOMY OR THROMBECTOMY, WITH OR WITHOUT CATHETER; FEMOROPOPLITEAL, AORTOILIAC ARTERY, BY LEG INCISION N/A 06/21/2020   Left - Decomp Fasciotomy Leg; Ant &/Or Lat Compart Only; Midline - Angiography, Visceral, Selective Or Supraselective (With Or Without Flush Aortogram); Left - Revascularize, Endovasc, Open/Percut, Iliac Artery, Unilat, Initial Vessel; W/Translum Stent, W/Angioplasty; Right - Revascularize, Endovasc, Open/Percut, Iliac Artery, Ea Add`L Ipsilateral; W/Translumin Stent, W/Angioplasty; Location [facility];   ENDARTERECTOMY FEMORAL Left 12/01/2021   Procedure: ENDARTERECTOMY FEMORAL;  Surgeon: Algernon Huxley, MD;  Location: [facility];  Service: Vascular;  Laterality: Left;   ENDOVASCULAR REPAIR/STENT GRAFT Left 12/01/2021   Procedure: ENDOVASCULAR REPAIR/STENT GRAFT;  Surgeon: Algernon Huxley, MD;  Location:  [place] [facility];  Service: Cardiovascular;  Laterality: Left;   LAPAROTOMY N/A 08/08/2017   Procedure: EXPLORATORY LAPAROTOMY POSSIBLE BOWEL RESECTION;  Surgeon: Jules Husbands, MD;  Location: [facility];  Service: General;  Laterality: N/A;   LEFT HEART CATH AND CORONARY ANGIOGRAPHY N/A 05/17/2021   Procedure: LEFT HEART CATH AND CORONARY ANGIOGRAPHY;  Surgeon: Yolonda Kida, MD;  Location: [place] [facility];  Service: Cardiovascular;  Laterality: N/A;   LOWER EXTREMITY ANGIOGRAPHY Left 11/17/2019   Procedure: LOWER EXTREMITY ANGIOGRAPHY;  Surgeon: Algernon Huxley, MD;  Location: [place] [facility];  Service: Cardiovascular;  Laterality: Left;   LOWER EXTREMITY ANGIOGRAPHY Right 12/13/2020   Procedure: LOWER EXTREMITY ANGIOGRAPHY;  Surgeon: Algernon Huxley, MD;  Location: [place] [facility];  Service: Cardiovascular;  Laterality: Right;   LOWER EXTREMITY ANGIOGRAPHY Right 01/10/2021   Procedure: LOWER EXTREMITY ANGIOGRAPHY;  Surgeon: Algernon Huxley, MD;  Location: [place] [facility];  Service: Cardiovascular;  Laterality: Right;   LOWER EXTREMITY ANGIOGRAPHY Right 01/24/2021   Procedure: LOWER EXTREMITY ANGIOGRAPHY;  Surgeon: Lucky Cowboy,  Erskine Squibb, MD;  Location: [place] [facility];  Service: Cardiovascular;  Laterality: Right;   LOWER EXTREMITY ANGIOGRAPHY Bilateral 06/28/2021   Procedure: Lower Extremity Angiography;  Surgeon: Algernon Huxley, MD;  Location: [place] [facility];  Service: Cardiovascular;  Laterality: Bilateral;   LOWER EXTREMITY ANGIOGRAPHY Right 11/28/2021   Procedure: LOWER EXTREMITY ANGIOGRAPHY;  Surgeon: Algernon Huxley, MD;  Location: [place] [facility];  Service: Cardiovascular;  Laterality: Right;   LOWER EXTREMITY ANGIOGRAPHY Left 11/30/2021   Procedure: Lower Extremity Angiography;  Surgeon: Algernon Huxley, MD;  Location: [place] [facility];  Service: Cardiovascular;  Laterality: Left;   LOWER EXTREMITY ANGIOGRAPHY Right 02/22/2022   Procedure: Lower Extremity Angiography;   Surgeon: Algernon Huxley, MD;  Location: [place] [facility];  Service: Cardiovascular;  Laterality: Right;   LOWER EXTREMITY ANGIOGRAPHY Right 03/13/2022   Procedure: Lower Extremity Angiography;  Surgeon: Algernon Huxley, MD;  Location: [place] [facility];  Service: Cardiovascular;  Laterality: Right;   LOWER EXTREMITY ANGIOGRAPHY Right 03/24/2022   Procedure: Lower Extremity Angiography;  Surgeon: Algernon Huxley, MD;  Location: [place] [facility];  Service: Cardiovascular;  Laterality: Right;   LOWER EXTREMITY INTERVENTION N/A 11/19/2019   Procedure: LOWER EXTREMITY INTERVENTION;  Surgeon: Algernon Huxley, MD;  Location: [place] [facility];  Service: Cardiovascular;  Laterality: N/A;   LOWER EXTREMITY INTERVENTION Bilateral 06/29/2021   Procedure: LOWER EXTREMITY INTERVENTION;  Surgeon: Algernon Huxley, MD;  Location: [place] [facility];  Service: Cardiovascular;  Laterality: Bilateral;   TEE WITHOUT CARDIOVERSION N/A 06/22/2017   Procedure: TRANSESOPHAGEAL ECHOCARDIOGRAM (TEE);  Surgeon: Wellington Hampshire, MD;  Location: [facility];  Service: Cardiovascular;  Laterality: N/A;   TOTAL KNEE ARTHROPLASTY Right 07/11/2018   Procedure: TOTAL KNEE ARTHROPLASTY;  Surgeon: Corky Mull, MD;  Location: [facility];  Service: Orthopedics;  Laterality: Right;   VAGINAL HYSTERECTOMY     VISCERAL ARTERY INTERVENTION N/A 06/20/2017   Procedure: Visceral Artery Intervention, possible aortic thrombectomy;  Surgeon: Algernon Huxley, MD;  Location: [place] [facility];  Service: Cardiovascular;  Laterality: N/A;   VISCERAL ARTERY INTERVENTION N/A 01/28/2018   Procedure: VISCERAL ARTERY INTERVENTION;  Surgeon: Algernon Huxley, MD;  Location: [place] [facility];  Service: Cardiovascular;  Laterality: N/A;    Social History:  reports that she quit smoking about 6 months ago. Her smoking use included cigarettes. She smoked an average of 1 pack per day. She has never used smokeless tobacco. She reports that she does not drink  alcohol and does not use drugs.   Allergies  Allergen Reactions   Gabapentin     Tremors    Bactrim [Sulfamethoxazole-Trimethoprim] Itching    Family History  Problem Relation Age of Onset   Hypertension Mother    Heart disease Mother    Heart attack Mother    Breast cancer Mother    Hypertension Father    Breast cancer Paternal Aunt      Prior to Admission medications   Medication Sig Start Date End Date Taking? Authorizing Provider  Amino Acids-Protein Hydrolys (PRO-STAT) LIQD Take 30 mLs by mouth in the morning and at bedtime.    [provider]  amLODipine (NORVASC) 5 MG tablet Take 5 mg by mouth daily. 03/27/22   [provider]  apixaban (ELIQUIS) 5 MG TABS tablet Take 1 tablet (5 mg total) by mouth 2 (two) times daily. 01/25/21   Stegmayer, Janalyn Harder, PA-C  aspirin EC 81 MG tablet  Take 81 mg by mouth daily. Swallow whole.    [provider]  atorvastatin (LIPITOR) 80 MG tablet Take 80 mg by mouth at bedtime.    [provider]  ciprofloxacin (CIPRO) 500 MG tablet Take 500 mg by mouth 2 (two) times daily.    [provider]  citalopram (CELEXA) 40 MG tablet Take 1 tablet (40 mg total) by mouth daily. 02/04/22 05/05/22  Enzo Bi, MD  COMBIVENT RESPIMAT 20-100 MCG/ACT AERS respimat  03/27/22   [provider]  doxycycline (VIBRAMYCIN) 100 MG capsule Take 1 capsule (100 mg total) by mouth 2 (two) times daily. Patient not taking: Reported on 05/01/2022 04/21/22   Kris Hartmann, NP  ferrous sulfate 325 (65 FE) MG tablet Take 325 mg by mouth daily.    [provider]  furosemide (LASIX) 40 MG tablet Take 40 mg by mouth daily.    [provider]  isosorbide mononitrate (IMDUR) 60 MG 24 hr tablet Take 60 mg by mouth daily. 01/23/22   [provider]  methocarbamol (ROBAXIN) 500 MG tablet Take 1 tablet (500 mg total) by mouth every 8 (eight) hours as needed for muscle spasms. 04/12/22   Kris Hartmann, NP   metoprolol tartrate (LOPRESSOR) 50 MG tablet Take 1 tablet (50 mg total) by mouth 2 (two) times daily. 06/15/18   Bettey Costa, MD  morphine (MS CONTIN) 15 MG 12 hr tablet Take 15 mg by mouth every 8 (eight) hours.    [provider]  Multiple Vitamin (MULTIVITAMIN WITH MINERALS) TABS tablet Take 1 tablet by mouth daily. 03/25/22   Fritzi Mandes, MD  nitroGLYCERIN (NITROSTAT) 0.4 MG SL tablet Place 0.4 mg under the tongue as directed. 06/02/21 06/02/22  [provider]  omeprazole (PRILOSEC) 20 MG capsule Take 20 mg by mouth 2 (two) times daily before a meal.    [provider]  ondansetron (ZOFRAN) 4 MG tablet Take 4 mg by mouth every 8 (eight) hours as needed for nausea or vomiting.    [provider]  oxyCODONE-acetaminophen (PERCOCET/ROXICET) 5-325 MG tablet Take 1-2 tablets by mouth every 6 (six) hours as needed for moderate pain. 04/12/22   Kris Hartmann, NP  pregabalin (LYRICA) 100 MG capsule Take 1 capsule (100 mg total) by mouth 3 (three) times daily. 03/24/22   Fritzi Mandes, MD  traMADol (ULTRAM) 50 MG tablet Take 50 mg by mouth every 6 (six) hours as needed. 03/15/22   [provider]    Physical Exam: BP (!) 98/59 (BP Location: Left Arm)   Pulse 61   Temp 97.8 F (36.6 C)   Resp 16   Ht '5\' 2"'$  (1.575 m)   Wt 99.8 kg   SpO2 98%   BMI 40.24 kg/m   General: 61 y.o. year-old female ill appearing, but in no acute distress.  Alert and oriented x3. HEENT: NCAT, EOMI Neck: Supple, trachea medial Cardiovascular: Regular rate and rhythm with no rubs or gallops.  No thyromegaly or JVD noted.  No lower extremity edema. 2/4 pulses in all 4 extremities. Respiratory: Clear to auscultation with no wheezes or rales. Good inspiratory effort. Abdomen: Soft, nontender nondistended with normal bowel sounds x4 quadrants. Muskuloskeletal: Right AKA wound with drainage.  No cyanosis, clubbing or edema noted bilaterally Neuro: CN II-XII intact, strength 5/5 x 4,  sensation, reflexes intact Skin:Right AKA wound with drainage.  Psychiatry: Judgement and insight appear normal. Mood is appropriate for condition and setting  Labs on Admission:  Basic Metabolic Panel: Recent Labs  Lab 05/02/22 1158  NA 140  K 4.0  CL 106  CO2 26  GLUCOSE 103*  BUN 12  CREATININE 0.84  CALCIUM 8.7*   Liver Function Tests: Recent Labs  Lab 05/02/22 1158  AST 15  ALT 10  ALKPHOS 105  BILITOT 0.4  PROT 6.4*  ALBUMIN 2.8*   No results for input(s): "LIPASE", "AMYLASE" in the last 168 hours. No results for input(s): "AMMONIA" in the last 168 hours. CBC: Recent Labs  Lab 05/02/22 1158  WBC 7.4  NEUTROABS 4.7  HGB 9.4*  HCT 31.2*  MCV 88.1  PLT 282   Cardiac Enzymes: No results for input(s): "CKTOTAL", "CKMB", "CKMBINDEX", "TROPONINI" in the last 168 hours.  BNP (last 3 results) Recent Labs    01/18/22 1646  BNP 29.0    ProBNP (last 3 results) No results for input(s): "PROBNP" in the last 8760 hours.  CBG: No results for input(s): "GLUCAP" in the last 168 hours.  Radiological Exams on Admission: DG Chest Port 1 View  Result Date: 05/02/2022 CLINICAL DATA:  Suspected sepsis EXAM: PORTABLE CHEST 1 VIEW COMPARISON:  02/03/2022 FINDINGS: The heart size and mediastinal contours are within normal limits. Both lungs are clear. The visualized skeletal structures are unremarkable. IMPRESSION: No active disease. Electronically Signed   By: Davina Poke D.O.   On: 05/02/2022 12:26    EKG: I independently viewed the EKG done and my findings are as followed: EKG was not done in the ED  Assessment/Plan Present on Admission:  Wound infection  PAD (peripheral artery disease) ([place])  CAD (coronary artery disease)  Principal Problem:   Wound infection Active Problems:   CAD (coronary artery disease)   PAD (peripheral artery disease) ([place])   Morbid obesity with BMI of 40.0-44.9, adult ([place])   Essential hypertension   GERD  (gastroesophageal reflux disease)   Mixed hyperlipidemia   Acute respiratory failure with hypoxia (HCC)   Lactic acidosis   Hypoalbuminemia due to protein-calorie malnutrition (HCC)  Right AKA wound infection status post outpatient treatment failure  Patient failed outpatient treatment She already followed up with vascular surgeon as an outpatient who intends for her to have a surgical intervention per ED medical record She was started on IV vancomycin and cefepime, we shall continue with same at this time Continue Tylenol as needed for mild pain Continue Percocet as needed for moderate/severe pain Vascular surgeon (Dr. Lucky Cowboy) was consulted and will follow-up with patient per ED physician  Acute respiratory failure with hypoxia Patient was reported to have an O2 sat of 87 on room air; continue supplemental oxygen to maintain O2 sat greater than 94% with plan to wean patient off supplemental oxygen as tolerated  Lactic acidosis-resolved Lactic acid 2.2 > 1.8  Hypoalbuminemia possibly secondary to moderate protein calorie malnutrition Albumin 2.8, protein supplement to be provided  Essential hypertension BP meds will be held at this time due to soft BP  Mixed hyperlipidemia Continue Lipitor  GERD Continue Protonix  PAD/CAD continue aspirin, Lipitor Eliquis will be temporarily held in aspirin for possible surgical intervention Continue heparin and consider holding this once time for surgical intervention is known  Iron deficiency anemia Continue ferrous sulfate  Morbid obesity (BMI 40.24 kg/m) Diet and lifestyle modification   DVT prophylaxis: Heparin subcu  Code Status: Full code  Consults: Dr Lucky Cowboy  Family Communication: None at bedside  Severity of Illness:The appropriate patient status for this patient is INPATIENT. Inpatient status  is judged to be reasonable and necessary in order to provide the required intensity of service to ensure the patient's safety. The  patient's presenting symptoms, physical exam findings, and initial radiographic and laboratory data in the context of their chronic comorbidities is felt to place them at high risk for further clinical deterioration. Furthermore, it is not anticipated that the patient will be medically stable for discharge from the hospital within 2 midnights of admission.   * I certify that at the point of admission it is my clinical judgment that the patient will require inpatient hospital care spanning beyond 2 midnights from the point of admission due to high intensity of service, high risk for further deterioration and high frequency of surveillance required.*    Author: Bernadette Hoit, DO 05/02/2022 4:37 PM  For on call review [email or web address].

## 2022-05-02 NOTE — Progress Notes (Signed)
Chaplain follow up emotional support

## 2022-05-02 NOTE — Consult Note (Addendum)
Pharmacy Antibiotic Note  Carla [name] is a 61 y.o. female w/ recent h/o vascular surgery (04/06/2022) with right leg rest pain with irreversible ischemia who underwent AKA, now presents to the ED today c/f infection at the amputation site & admitted on 05/02/2022 with  Wound Infection .  Pharmacy has been consulted for vancomycin & cefepime dosing.  Plan: Cefepime 2g IV x1 7/11 '@1245'$ ; followed by Cefepime 2g IV q8h  Vancomycin IV 1g x1 given in ED '@1330'$  then will give add'l '1250mg'$  x1 now to complete loading dose. Followed by maintenance dose of Vancomycin IV '1500mg'$  q24h Goal AUC 400-600  Est AUC: 442; Cmax: 37; Cmin: 8.1 SCr 0.84; AdjBW (BMI > 40); Vd 0.5  Temp (24hrs), Avg:98.4 F (36.9 C), Min:97.8 F (36.6 C), Max:99.3 F (37.4 C)  Recent Labs  Lab 05/02/22 1156 05/02/22 1158 05/02/22 1510  WBC  --  7.4  --   CREATININE  --  0.84  --   LATICACIDVEN 2.2*  --  1.8    CrCl cannot be calculated (Unknown ideal weight.).    Allergies  Allergen Reactions   Gabapentin     Tremors    Bactrim [Sulfamethoxazole-Trimethoprim] Itching    Antimicrobials this admission: Vancomycin / Cefepime  (7/11 >>   Dose adjustments this admission: CTM and adjust PRN  Microbiology results: 7/11 BCx: pending 7/11 Covid: negative  7/11 MRSA PCR: pending  Thank you for allowing pharmacy to be a part of this patient's care.  Lorna Dibble 05/02/2022 4:27 PM

## 2022-05-03 ENCOUNTER — Inpatient Hospital Stay: Payer: Medicare Other | Admitting: Anesthesiology

## 2022-05-03 ENCOUNTER — Other Ambulatory Visit: Payer: Self-pay

## 2022-05-03 ENCOUNTER — Encounter: Payer: Self-pay | Admitting: Internal Medicine

## 2022-05-03 ENCOUNTER — Encounter (INDEPENDENT_AMBULATORY_CARE_PROVIDER_SITE_OTHER): Payer: Self-pay | Admitting: Nurse Practitioner

## 2022-05-03 ENCOUNTER — Encounter
Admission: EM | Disposition: A | Discharge status: Home Health | Payer: Self-pay | Source: Skilled Nursing Facility | Attending: Internal Medicine

## 2022-05-03 DIAGNOSIS — T148XXA Other injury of unspecified body region, initial encounter: Secondary | ICD-10-CM | POA: Diagnosis not present

## 2022-05-03 DIAGNOSIS — Z89611 Acquired absence of right leg above knee: Secondary | ICD-10-CM

## 2022-05-03 DIAGNOSIS — T8753 Necrosis of amputation stump, right lower extremity: Secondary | ICD-10-CM

## 2022-05-03 DIAGNOSIS — L089 Local infection of the skin and subcutaneous tissue, unspecified: Secondary | ICD-10-CM | POA: Diagnosis not present

## 2022-05-03 DIAGNOSIS — T8743 Infection of amputation stump, right lower extremity: Principal | ICD-10-CM

## 2022-05-03 HISTORY — PX: INCISION AND DRAINAGE OF WOUND: SHX1803

## 2022-05-03 HISTORY — PX: APPLICATION OF WOUND VAC: SHX5189

## 2022-05-03 LAB — COMPREHENSIVE METABOLIC PANEL
ALT: 10 U/L (ref 0–44)
AST: 10 U/L — ABNORMAL LOW (ref 15–41)
Albumin: 2.7 g/dL — ABNORMAL LOW (ref 3.5–5.0)
Alkaline Phosphatase: 93 U/L (ref 38–126)
Anion gap: 6 (ref 5–15)
BUN: 15 mg/dL (ref 8–23)
CO2: 27 mmol/L (ref 22–32)
Calcium: 8.5 mg/dL — ABNORMAL LOW (ref 8.9–10.3)
Chloride: 109 mmol/L (ref 98–111)
Creatinine, Ser: 0.68 mg/dL (ref 0.44–1.00)
GFR, Estimated: >60 mL/min (ref >60)
Glucose, Bld: 108 mg/dL — ABNORMAL HIGH (ref 70–99)
Potassium: 3.9 mmol/L (ref 3.5–5.1)
Sodium: 142 mmol/L (ref 135–145)
Total Bilirubin: 0.6 mg/dL (ref 0.3–1.2)
Total Protein: 6 g/dL — ABNORMAL LOW (ref 6.5–8.1)

## 2022-05-03 LAB — CBC
HCT: 31.7 % — ABNORMAL LOW (ref 36.0–46.0)
Hemoglobin: 9.5 g/dL — ABNORMAL LOW (ref 12.0–15.0)
MCH: 26.4 pg (ref 26.0–34.0)
MCHC: 30 g/dL (ref 30.0–36.0)
MCV: 88.1 fL (ref 80.0–100.0)
Platelets: 267 10*3/uL (ref 150–400)
RBC: 3.6 MIL/uL — ABNORMAL LOW (ref 3.87–5.11)
RDW: 19.7 % — ABNORMAL HIGH (ref 11.5–15.5)
WBC: 6.4 10*3/uL (ref 4.0–10.5)
nRBC: 0 % (ref 0.0–0.2)

## 2022-05-03 LAB — TYPE AND SCREEN
ABO/RH(D): A POS
Antibody Screen: NEGATIVE

## 2022-05-03 LAB — MAGNESIUM: Magnesium: 2.4 mg/dL (ref 1.7–2.4)

## 2022-05-03 LAB — PHOSPHORUS: Phosphorus: 4.4 mg/dL (ref 2.5–4.6)

## 2022-05-03 LAB — APTT: aPTT: 34 seconds (ref 24–36)

## 2022-05-03 SURGERY — IRRIGATION AND DEBRIDEMENT WOUND
Anesthesia: General | Laterality: Right

## 2022-05-03 MED ORDER — HYDROMORPHONE HCL 1 MG/ML IJ SOLN
1.0000 mg | Freq: Once | INTRAMUSCULAR | Status: AC | PRN
  Administered 2022-05-03: 1 mg via INTRAVENOUS
  Filled 2022-05-03: qty 1

## 2022-05-03 MED ORDER — PREGABALIN 50 MG PO CAPS
100.0000 mg | ORAL_CAPSULE | Freq: Three times a day (TID) | ORAL | Status: DC
  Administered 2022-05-03 – 2022-05-06 (×8): 100 mg via ORAL
  Filled 2022-05-03 (×8): qty 2

## 2022-05-03 MED ORDER — MIDAZOLAM HCL 2 MG/2ML IJ SOLN
INTRAMUSCULAR | Status: AC
  Filled 2022-05-03: qty 2

## 2022-05-03 MED ORDER — HYDROMORPHONE HCL 1 MG/ML IJ SOLN
INTRAMUSCULAR | Status: AC
  Filled 2022-05-03: qty 1

## 2022-05-03 MED ORDER — ONDANSETRON HCL 4 MG/2ML IJ SOLN: 4.0000 mg | Freq: Four times a day (QID) | INTRAMUSCULAR | Status: DC | PRN

## 2022-05-03 MED ORDER — ONDANSETRON HCL 4 MG/2ML IJ SOLN
INTRAMUSCULAR | Status: DC | PRN
  Administered 2022-05-03: 4 mg via INTRAVENOUS

## 2022-05-03 MED ORDER — LIDOCAINE HCL (CARDIAC) PF 100 MG/5ML IV SOSY
PREFILLED_SYRINGE | INTRAVENOUS | Status: DC | PRN
  Administered 2022-05-03: 100 mg via INTRAVENOUS

## 2022-05-03 MED ORDER — CHLORHEXIDINE GLUCONATE 0.12 % MT SOLN
15.0000 mL | Freq: Once | OROMUCOSAL | Status: AC
Start: 2022-05-03 — End: 2022-05-03

## 2022-05-03 MED ORDER — PROPOFOL 10 MG/ML IV BOLUS
INTRAVENOUS | Status: AC
  Filled 2022-05-03: qty 20

## 2022-05-03 MED ORDER — FENTANYL CITRATE (PF) 100 MCG/2ML IJ SOLN
INTRAMUSCULAR | Status: DC | PRN
  Administered 2022-05-03 (×2): 50 ug via INTRAVENOUS
  Administered 2022-05-03: 100 ug via INTRAVENOUS

## 2022-05-03 MED ORDER — BUPIVACAINE HCL (PF) 0.5 % IJ SOLN
INTRAMUSCULAR | Status: AC
  Filled 2022-05-03: qty 30

## 2022-05-03 MED ORDER — DEXMEDETOMIDINE HCL IN NACL 80 MCG/20ML IV SOLN
INTRAVENOUS | Status: AC
  Filled 2022-05-03: qty 20

## 2022-05-03 MED ORDER — PROPOFOL 10 MG/ML IV BOLUS
INTRAVENOUS | Status: DC | PRN
  Administered 2022-05-03: 30 mg via INTRAVENOUS
  Administered 2022-05-03: 50 mg via INTRAVENOUS
  Administered 2022-05-03: 120 mg via INTRAVENOUS
  Administered 2022-05-03: 50 mg via INTRAVENOUS

## 2022-05-03 MED ORDER — FENTANYL CITRATE (PF) 100 MCG/2ML IJ SOLN
25.0000 ug | INTRAMUSCULAR | Status: DC | PRN
  Administered 2022-05-03 (×2): 50 ug via INTRAVENOUS

## 2022-05-03 MED ORDER — FENTANYL CITRATE (PF) 100 MCG/2ML IJ SOLN
INTRAMUSCULAR | Status: AC
  Filled 2022-05-03: qty 2

## 2022-05-03 MED ORDER — DEXAMETHASONE SODIUM PHOSPHATE 10 MG/ML IJ SOLN
INTRAMUSCULAR | Status: DC | PRN
  Administered 2022-05-03: 5 mg via INTRAVENOUS

## 2022-05-03 MED ORDER — ONDANSETRON HCL 4 MG/2ML IJ SOLN
INTRAMUSCULAR | Status: AC
  Filled 2022-05-03: qty 2

## 2022-05-03 MED ORDER — LIDOCAINE HCL (PF) 1 % IJ SOLN
INTRAMUSCULAR | Status: AC
  Filled 2022-05-03: qty 30

## 2022-05-03 MED ORDER — LACTATED RINGERS IV SOLN: INTRAVENOUS | Status: DC

## 2022-05-03 MED ORDER — ORAL CARE MOUTH RINSE: 15.0000 mL | OROMUCOSAL | Status: DC | PRN

## 2022-05-03 MED ORDER — CHLORHEXIDINE GLUCONATE CLOTH 2 % EX PADS
6.0000 | MEDICATED_PAD | Freq: Once | CUTANEOUS | Status: AC
  Administered 2022-05-03: 6 via TOPICAL

## 2022-05-03 MED ORDER — LIDOCAINE HCL (PF) 2 % IJ SOLN
INTRAMUSCULAR | Status: AC
  Filled 2022-05-03: qty 5

## 2022-05-03 MED ORDER — ORAL CARE MOUTH RINSE
15.0000 mL | Freq: Once | OROMUCOSAL | Status: AC
Start: 2022-05-03 — End: 2022-05-03

## 2022-05-03 MED ORDER — IPRATROPIUM-ALBUTEROL 0.5-2.5 (3) MG/3ML IN SOLN: 3.0000 mL | Freq: Four times a day (QID) | RESPIRATORY_TRACT | Status: DC | PRN

## 2022-05-03 MED ORDER — DEXAMETHASONE SODIUM PHOSPHATE 10 MG/ML IJ SOLN
INTRAMUSCULAR | Status: AC
  Filled 2022-05-03: qty 1

## 2022-05-03 MED ORDER — HYDROMORPHONE HCL 1 MG/ML IJ SOLN
0.5000 mg | INTRAMUSCULAR | Status: DC | PRN
  Administered 2022-05-03 (×2): 0.5 mg via INTRAVENOUS

## 2022-05-03 MED ORDER — CHLORHEXIDINE GLUCONATE 0.12 % MT SOLN
OROMUCOSAL | Status: AC
  Administered 2022-05-03: 15 mL via OROMUCOSAL
  Filled 2022-05-03: qty 15

## 2022-05-03 MED ORDER — ACETAMINOPHEN 500 MG PO TABS: 1000.0000 mg | ORAL_TABLET | Freq: Once | ORAL | Status: DC

## 2022-05-03 MED ORDER — HEPARIN SODIUM (PORCINE) 5000 UNIT/ML IJ SOLN
INTRAMUSCULAR | Status: AC
  Administered 2022-05-03: 5000 [IU] via SUBCUTANEOUS
  Filled 2022-05-03: qty 1

## 2022-05-03 MED ORDER — OXYCODONE HCL 5 MG PO TABS: 5.0000 mg | ORAL_TABLET | Freq: Once | ORAL | Status: DC | PRN

## 2022-05-03 MED ORDER — ONDANSETRON HCL 4 MG PO TABS: 4.0000 mg | ORAL_TABLET | Freq: Four times a day (QID) | ORAL | Status: DC | PRN

## 2022-05-03 MED ORDER — ISOSORBIDE MONONITRATE ER 30 MG PO TB24
60.0000 mg | ORAL_TABLET | Freq: Every day | ORAL | Status: DC
  Administered 2022-05-04 – 2022-05-06 (×3): 60 mg via ORAL
  Filled 2022-05-03 (×3): qty 2

## 2022-05-03 MED ORDER — CHLORHEXIDINE GLUCONATE CLOTH 2 % EX PADS
6.0000 | MEDICATED_PAD | Freq: Once | CUTANEOUS | Status: DC
  Administered 2022-05-03: 6 via TOPICAL

## 2022-05-03 MED ORDER — MIDAZOLAM HCL 2 MG/2ML IJ SOLN
INTRAMUSCULAR | Status: DC | PRN
  Administered 2022-05-03: 2 mg via INTRAVENOUS

## 2022-05-03 MED ORDER — OXYCODONE HCL 5 MG/5ML PO SOLN: 5.0000 mg | Freq: Once | ORAL | Status: DC | PRN

## 2022-05-03 MED ORDER — ADULT MULTIVITAMIN W/MINERALS CH
1.0000 | ORAL_TABLET | Freq: Every day | ORAL | Status: DC
  Administered 2022-05-04 – 2022-05-06 (×3): 1 via ORAL
  Filled 2022-05-03 (×3): qty 1

## 2022-05-03 MED ORDER — CITALOPRAM HYDROBROMIDE 10 MG PO TABS
40.0000 mg | ORAL_TABLET | Freq: Every day | ORAL | Status: DC
  Administered 2022-05-04 – 2022-05-06 (×3): 40 mg via ORAL
  Filled 2022-05-03 (×3): qty 4

## 2022-05-03 MED ORDER — 0.9 % SODIUM CHLORIDE (POUR BTL) OPTIME
TOPICAL | Status: DC | PRN
  Administered 2022-05-03: 1000 mL

## 2022-05-03 SURGICAL SUPPLY — 28 items
BRUSH SCRUB EZ  4% CHG (MISCELLANEOUS) ×1
BRUSH SCRUB EZ 4% CHG (MISCELLANEOUS) ×1 IMPLANT
CANISTER WOUND CARE 500ML ATS (WOUND CARE) ×2 IMPLANT
DRAPE INCISE IOBAN 66X45 STRL (DRAPES) ×2 IMPLANT
DRAPE LAPAROTOMY 100X77 ABD (DRAPES) ×2 IMPLANT
DRSG VAC ATS MED SENSATRAC (GAUZE/BANDAGES/DRESSINGS) ×2 IMPLANT
ELECT CAUTERY BLADE 6.4 (BLADE) ×2 IMPLANT
ELECT REM PT RETURN 9FT ADLT (ELECTROSURGICAL) ×2
ELECTRODE REM PT RTRN 9FT ADLT (ELECTROSURGICAL) ×1 IMPLANT
GAUZE 4X4 16PLY ~~LOC~~+RFID DBL (SPONGE) ×2 IMPLANT
GLOVE BIO SURGEON STRL SZ7 (GLOVE) ×2 IMPLANT
GOWN STRL REUS W/ TWL LRG LVL3 (GOWN DISPOSABLE) ×1 IMPLANT
GOWN STRL REUS W/ TWL XL LVL3 (GOWN DISPOSABLE) ×1 IMPLANT
GOWN STRL REUS W/TWL LRG LVL3 (GOWN DISPOSABLE) ×1
GOWN STRL REUS W/TWL XL LVL3 (GOWN DISPOSABLE) ×1
IV NS 1000ML (IV SOLUTION) ×1
IV NS 1000ML BAXH (IV SOLUTION) ×1 IMPLANT
KIT TURNOVER KIT A (KITS) ×2 IMPLANT
LABEL OR SOLS (LABEL) ×2 IMPLANT
MANIFOLD NEPTUNE II (INSTRUMENTS) ×2 IMPLANT
NS IRRIG 1000ML POUR BTL (IV SOLUTION) ×2 IMPLANT
PACK BASIN MINOR ARMC (MISCELLANEOUS) ×2 IMPLANT
PACK EXTREMITY ARMC (MISCELLANEOUS) ×2 IMPLANT
PAD PREP 24X41 OB/GYN DISP (PERSONAL CARE ITEMS) ×2 IMPLANT
SOL PREP PVP 2OZ (MISCELLANEOUS) ×4
SOLUTION PREP PVP 2OZ (MISCELLANEOUS) ×1 IMPLANT
SPONGE T-LAP 18X18 ~~LOC~~+RFID (SPONGE) ×2 IMPLANT
SYR BULB IRRIG 60ML STRL (SYRINGE) ×2 IMPLANT

## 2022-05-03 NOTE — Progress Notes (Signed)
Patient is from [facility].

## 2022-05-03 NOTE — Progress Notes (Signed)
[place] [facility] [place] NAME: Carla [name]    MR#:  [id number]  DATE OF BIRTH:  1960/11/14  SUBJECTIVE:      VITALS:  Blood pressure (!) 128/57, pulse 75, temperature 97.9 F (36.6 C), resp. rate 13, height '5\' 2"'$  (1.575 m), weight 99.8 kg, SpO2 96 %.  PHYSICAL EXAMINATION:   GENERAL:  61 y.o.-year-old patient lying in the bed with no acute distress.  LUNGS: Normal breath sounds bilaterally, no wheezing, rales, rhonchi.  CARDIOVASCULAR: S1, S2 normal. No murmurs, rubs, or gallops.  ABDOMEN: Soft, nontender, nondistended. Bowel sounds present.  EXTREMITIES:     NEUROLOGIC: nonfocal  patient is alert and awake SKIN: as above  LABORATORY PANEL:  CBC Recent Labs  Lab 05/03/22 0444  WBC 6.4  HGB 9.5*  HCT 31.7*  PLT 267    Chemistries  Recent Labs  Lab 05/03/22 0444  NA 142  K 3.9  CL 109  CO2 27  GLUCOSE 108*  BUN 15  CREATININE 0.68  CALCIUM 8.5*  MG 2.4  AST 10*  ALT 10  ALKPHOS 93  BILITOT 0.6   Cardiac Enzymes No results for input(s): "TROPONINI" in the last 168 hours. RADIOLOGY:  DG Chest Port 1 View  Result Date: 05/02/2022 CLINICAL DATA:  Suspected sepsis EXAM: PORTABLE CHEST 1 VIEW COMPARISON:  02/03/2022 FINDINGS: The heart size and mediastinal contours are within normal limits. Both lungs are clear. The visualized skeletal structures are unremarkable. IMPRESSION: No active disease. Electronically Signed   By: Davina Poke D.O.   On: 05/02/2022 12:26    Assessment and Plan Carla [name] is a 61 y.o. female with medical history significant of  PAD s/p right AKA, hypertension, hyperlipidemia, iron deficiency anemia, CAD, GERD who presents to the emergency department via EMS from SNF due to wound at the stump of right AKA, she was seen at the vascular clinic about a week ago and was started on oral antibiotics.  She followed up yesterday and it was noted that the infection was worse and the patient may need  surgical intervention.  Right AKA. amputation stump infection with outpatient treatment failure -- patient seen by Dr. Lucky Cowboy and recommends irrigation and debridement -- continue broad-spectrum antibiotic for today was discussed with vascular and narrow down -- PRN pain meds  acute hypoxic respiratory failure suspect COPD exacerbation -- patient does not use chronic oxygen -sats were 87% on room air -- no respiratory distress wean to room air as tolerated -- PRN bronchodilators  Hypertension -- BP was soft hold meds  PAD/CAD -- continue Lipitor and aspirin -- patient also in eliquis-- will resume after surgery  iron deficiency anemia -- continue ferrous sulfate      Procedures: Family communication : none Consults : vascular CODE STATUS: full DVT Prophylaxis : heparin Level of care: Med-Surg Status is: Inpatient Remains inpatient appropriate because: amputation stump infection    TOTAL TIME TAKING CARE OF THIS PATIENT: 30 minutes.  >50% time spent on counselling and coordination of care  Note: This dictation was prepared with Dragon dictation along with smaller phrase technology. Any transcriptional errors that result from this process are unintentional.  Fritzi Mandes M.D    [facility]   CC: Primary care physician; [facility]

## 2022-05-03 NOTE — Anesthesia Postprocedure Evaluation (Signed)
Anesthesia Post Note  Patient: Carla [name]  Procedure(s) Performed: IRRIGATION AND DEBRIDEMENT RIGHT STUMP (Right) APPLICATION OF WOUND VAC (Right)  Patient location during evaluation: PACU Anesthesia Type: General Level of consciousness: awake and alert Pain management: pain level controlled Vital Signs Assessment: post-procedure vital signs reviewed and stable Respiratory status: spontaneous breathing, nonlabored ventilation, respiratory function stable and patient connected to nasal cannula oxygen Cardiovascular status: blood pressure returned to baseline and stable Postop Assessment: no apparent nausea or vomiting Anesthetic complications: no   No notable events documented.   Last Vitals:  Vitals:   05/03/22 1653 05/03/22 1719  BP: (!) 128/57 (!) 118/56  Pulse: 75 70  Resp: 13   Temp: 36.6 C 36.8 C  SpO2: 96% 96%    Last Pain:  Vitals:   05/03/22 1719  TempSrc: Oral  PainSc:                  Precious Haws Kaelea Gathright

## 2022-05-03 NOTE — Anesthesia Procedure Notes (Signed)
Procedure Name: LMA Insertion Date/Time: 05/03/2022 3:16 PM  Performed by: Cammie Sickle, CRNAPre-anesthesia Checklist: Patient identified, Patient being monitored, Timeout performed, Emergency Drugs available and Suction available Patient Re-evaluated:Patient Re-evaluated prior to induction Oxygen Delivery Method: Circle system utilized Preoxygenation: Pre-oxygenation with 100% oxygen Induction Type: IV induction Ventilation: Mask ventilation without difficulty LMA: LMA inserted LMA Size: 4.0 Tube type: Oral Number of attempts: 1 Placement Confirmation: positive ETCO2 and breath sounds checked- equal and bilateral Tube secured with: Tape Dental Injury: Teeth and Oropharynx as per pre-operative assessment

## 2022-05-03 NOTE — Op Note (Signed)
    OPERATIVE NOTE   PROCEDURE: Irrigation and debridement of skin, soft tissue, and muscle for approximately 45 cm from the right above-knee amputations with negative pressure dressing placement  PRE-OPERATIVE DIAGNOSIS: Nonviable tissue and infection of   right above-knee amputation site  POST-OPERATIVE DIAGNOSIS: Same as above  SURGEON: [name], MD  ASSISTANT(S): None  ANESTHESIA: General  ESTIMATED BLOOD LOSS: 10 cc  FINDING(S): None  SPECIMEN(S): None  INDICATIONS:   Carla [name] is a 61 y.o. female who presents with partial wound dehiscence with some necrotic fat and skin and soft tissue from her right above-knee amputation site.  This needs debridement and a negative pressure dressing to try to promote wound healing.  DESCRIPTION: After obtaining full informed written consent, the patient was brought back to the operating room and placed supine upon the operating table.  The patient received IV antibiotics prior to induction.  After obtaining adequate anesthesia, the patient was prepped and draped in the standard fashion.  The wound was then opened and excisional debridement was performed to the skin, soft tissue, and some muscle fascia to remove all clearly non-viable tissue.  The tissue was taken back to bleeding tissue that appeared viable.  The debridement was performed with Metzenbaum scissors and encompassed an area of approximately 45 cm2.  The wound was 13 cm in its longest length, 4 to 5 cm in its widest width and 4 to 5 cm in depth points.  The wound was irrigated copiously with sterile saline.  After all clearly non-viable tissue was removed, hemostasis achieved and a negative pressure dressing was cut to fit the wound.  Strips of [place] were used and a good occlusive seal was obtained. The patient was then awakened from anesthesia and taken to the recovery room in stable condition having tolerated the procedure well.  COMPLICATIONS: none  CONDITION:  stable  [name]  05/03/2022, 3:54 PM   This note was created with Dragon Medical transcription system. Any errors in dictation are purely unintentional.

## 2022-05-03 NOTE — Transfer of Care (Signed)
Immediate Anesthesia Transfer of Care Note  Patient: Carla [name]  Procedure(s) Performed: IRRIGATION AND DEBRIDEMENT RIGHT STUMP (Right) APPLICATION OF WOUND VAC (Right)  Patient Location: PACU  Anesthesia Type:General  Level of Consciousness: drowsy  Airway & Oxygen Therapy: Patient Spontanous Breathing and Patient connected to face mask oxygen  Post-op Assessment: Report given to RN and Post -op Vital signs reviewed and stable  Post vital signs: Reviewed and stable  Last Vitals:  Vitals Value Taken Time  BP 123/52 05/03/22 1552  Temp 35.9 1552  Pulse 75 05/03/22 1554  Resp 11 05/03/22 1554  SpO2 99 % 05/03/22 1554  Vitals shown include unvalidated device data.  Last Pain:  Vitals:   05/03/22 1411  TempSrc: Oral  PainSc: 8          Complications: No notable events documented.

## 2022-05-03 NOTE — Anesthesia Preprocedure Evaluation (Addendum)
Anesthesia Evaluation  Patient identified by MRN, date of birth, ID band Patient awake    Reviewed: Allergy & Precautions, NPO status , Patient's Chart, lab work & pertinent test results  History of Anesthesia Complications Negative for: history of anesthetic complications  Airway Mallampati: III  TM Distance: <3 FB Neck ROM: full    Dental  (+) Missing   Pulmonary shortness of breath, COPD, former smoker,    Pulmonary exam normal        Cardiovascular hypertension, (-) angina+ CAD, + Past MI (NSTEMI 06/2021), + Peripheral Vascular Disease and +CHF  Normal cardiovascular exam+ dysrhythmias Atrial Fibrillation   08/2012 Lexiscan MV: EF 54%, non ischemia/infarct   05/2017 CTA Abd/pelvis: apparent thrombus or embolus in prox SMA (70-90%); b. 05/2017 catheter directed tPA, mechanical thrombectomy, and stenting of the SMA.   TTE 06/20/2017: EF 60-65%, no rwma, G1DD, no source of cardiac emboli   Neuro/Psych PSYCHIATRIC DISORDERS Anxiety Depression Chronic pain syndrome  Neuromuscular disease    GI/Hepatic negative GI ROS, Neg liver ROS, GERD  Controlled,  Endo/Other  negative endocrine ROSMorbid obesity  Renal/GU Renal disease     Musculoskeletal  (+) Arthritis ,   Abdominal (+) + obese,   Peds  Hematology negative hematology ROS (+) Blood dyscrasia, anemia ,   Anesthesia Other Findings Past Medical History: No date: Abdominal aortic atherosclerosis ([place])     Comment:  a. 05/2017 CTA abd/pelvis: significant atherosclerotic               dzs of the infrarenal abd Ao w/ some mural thrombus. No               aneurysm or dissection. No date: Acute focal ischemia of small intestine ([place]) 06/13/2017: Acute right-sided low back pain with right-sided sciatica 07/29/2017: AKI (acute kidney injury) ([place]) No date: Anemia No date: Anginal pain ([place])     Comment:  a. 08/2012 Lexiscan MV: EF 54%, non ischemia/infarct. No date:  Anxiety and depression No date: Baker's cyst of knee, right     Comment:  a. 07/2016 U/S: 4.1 x 1.4 x 2.9 cystic structure in R               poplitetal fossa. No date: Bell's palsy No date: CAD (coronary artery disease) No date: CHF (congestive heart failure) ([place])     Comment:  a.) TTE 06/20/2017: EF 60-65%, no rwma, G1DD, no source               of cardiac emboli. No date: Chronic anticoagulation     Comment:  a.) apixaban No date: Chronic idiopathic constipation No date: Chronic mesenteric ischemia (HCC) No date: Chronic pain No date: COPD (chronic obstructive pulmonary disease) (HCC) No date: Dyspnea No date: 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[place])     Comment:  a. 05/2017 CTA Abd/pelvis: apparent thrombus or embolus               in prox SMA (70-90%); b. 05/2017 catheter directed tPA,               mechanical thrombectomy, and stenting of the SMA. 01/19/2016: Essential hypertension with goal blood pressure less than  140/90 01/19/2016: Gastroesophageal reflux disease No date: GERD (gastroesophageal reflux disease) No date: H/O colectomy 11/14/2019: History of 2019 novel coronavirus disease (COVID-19) No date: History of kidney stones No date: Hyperlipidemia No date: Hypertension No date: Morbid obesity with BMI of 40.0-44.9, adult ([place]) 06/27/2021: NSTEMI (non-ST elevated myocardial infarction) ([place])  Comment:  a.) high sensitivity troponins trended: 893 --> 1587 -->              1748 --> 868 --> 735 ng/L 06/19/2017: Occlusive mesenteric ischemia (HCC) No date: Osteoarthritis No date: PAD (peripheral artery disease) (HCC) No date: PAF (paroxysmal atrial fibrillation) (HCC)     Comment:  a.) CHA2DS2-VASc = 6 (sex, CHF, HTN, DVT x2, aortic               plaque). b.) rate/rhythm maintained on oral metoprolol               tartrate; chronically anticoagulated with standard dose               apixaban. No date: Palpitations No date: Peripheral edema 06/01/2016:  Personal history of other malignant neoplasm of skin No date: Pneumonia No date: Pre-diabetes 06/13/2017: Primary osteoarthritis of both knees 08/06/2017: SBO (small bowel obstruction) (HCC) No date: Skin cancer of nose No date: 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HCC) No date: Vitamin D deficiency  Past Surgical History: 04/06/2022: AMPUTATION; Right     Comment:  Procedure: AMPUTATION ABOVE KNEE;  Surgeon: Algernon Huxley, MD;  Location: [facility] ORS;  Service: Vascular;                Laterality: Right; 12/09/2019: AMPUTATION TOE; Left     Comment:  Procedure: AMPUTATION TOE MPJ left;  Surgeon: Caroline More, DPM;  Location: [facility] ORS;  Service: Podiatry;                Laterality: Left; No date: APPENDECTOMY 12/02/7987: APPLICATION OF WOUND VAC     Comment:  Procedure: APPLICATION OF WOUND VAC;  Surgeon: Algernon Huxley, MD;  Location: [facility] ORS;  Service: Vascular;; No date: CHOLECYSTECTOMY No date: COLON SURGERY 03/23/2022: COLONOSCOPY WITH PROPOFOL; N/A     Comment:  Procedure: COLONOSCOPY WITH PROPOFOL;  Surgeon: Jonathon Bellows, MD;  Location: [facility] ENDOSCOPY;  Service:               Gastroenterology;  Laterality: N/A;  rectal bleed 06/21/2020: EMBOLECTOMY OR THROMBECTOMY, WITH OR WITHOUT CATHETER;  FEMOROPOPLITEAL, AORTOILIAC ARTERY, BY LEG INCISION; N/A     Comment:  Left - Decomp Fasciotomy Leg; Ant &/Or Lat Compart Only;              Midline - Angiography, Visceral, Selective Or               Supraselective (With Or Without Flush Aortogram); Left -               Revascularize, Endovasc, Open/Percut, Iliac Artery,               Unilat, Initial Vessel; W/Translum Stent, W/Angioplasty;               Right - Revascularize, Endovasc, Open/Percut, Iliac               Artery, Ea Add`L Ipsilateral; W/Translumin Stent,               W/Angioplasty; Location [facility]; 12/01/2021: ENDARTERECTOMY FEMORAL; Left     Comment:  Procedure:  ENDARTERECTOMY FEMORAL;  Surgeon: [name]  S, MD;  Location: [facility] ORS;  Service: Vascular;                Laterality: Left; 12/01/2021: ENDOVASCULAR REPAIR/STENT GRAFT; Left     Comment:  Procedure: ENDOVASCULAR REPAIR/STENT GRAFT;  Surgeon:               Algernon Huxley, MD;  Location: [place] [facility];                Service: Cardiovascular;  Laterality: Left; 08/08/2017: LAPAROTOMY; N/A     Comment:  Procedure: EXPLORATORY LAPAROTOMY POSSIBLE BOWEL               RESECTION;  Surgeon: Jules Husbands, MD;  Location: [facility]               ORS;  Service: General;  Laterality: N/A; 05/17/2021: LEFT HEART CATH AND CORONARY ANGIOGRAPHY; N/A     Comment:  Procedure: LEFT HEART CATH AND CORONARY ANGIOGRAPHY;                Surgeon: Yolonda Kida, MD;  Location: [place]              CV LAB;  Service: Cardiovascular;  Laterality: N/A; 11/17/2019: LOWER EXTREMITY ANGIOGRAPHY; Left     Comment:  Procedure: LOWER EXTREMITY ANGIOGRAPHY;  Surgeon: Algernon Huxley, MD;  Location: [place] [facility];  Service:               Cardiovascular;  Laterality: Left; 12/13/2020: LOWER EXTREMITY ANGIOGRAPHY; Right     Comment:  Procedure: LOWER EXTREMITY ANGIOGRAPHY;  Surgeon: Algernon Huxley, MD;  Location: [place] [facility];  Service:               Cardiovascular;  Laterality: Right; 01/10/2021: LOWER EXTREMITY ANGIOGRAPHY; Right     Comment:  Procedure: LOWER EXTREMITY ANGIOGRAPHY;  Surgeon: Algernon Huxley, MD;  Location: [place] [facility];  Service:               Cardiovascular;  Laterality: Right; 01/24/2021: LOWER EXTREMITY ANGIOGRAPHY; Right     Comment:  Procedure: LOWER EXTREMITY ANGIOGRAPHY;  Surgeon: Algernon Huxley, MD;  Location: [place] [facility];  Service:               Cardiovascular;  Laterality: Right; 06/28/2021: LOWER EXTREMITY ANGIOGRAPHY; Bilateral     Comment:  Procedure: Lower Extremity Angiography;  Surgeon:  Algernon Huxley, MD;  Location: [place] [facility];  Service:               Cardiovascular;  Laterality: Bilateral; 11/28/2021: LOWER EXTREMITY ANGIOGRAPHY; Right     Comment:  Procedure: LOWER EXTREMITY ANGIOGRAPHY;  Surgeon: Algernon Huxley, MD;  Location: [place] [facility];  Service:               Cardiovascular;  Laterality: Right; 11/30/2021: LOWER EXTREMITY ANGIOGRAPHY; Left     Comment:  Procedure: Lower Extremity Angiography;  Surgeon: Lucky Cowboy,  Erskine Squibb, MD;  Location: [place] [facility];  Service:               Cardiovascular;  Laterality: Left; 02/22/2022: LOWER EXTREMITY ANGIOGRAPHY; Right     Comment:  Procedure: Lower Extremity Angiography;  Surgeon: Algernon Huxley, MD;  Location: [place] [facility];  Service:               Cardiovascular;  Laterality: Right; 03/13/2022: LOWER EXTREMITY ANGIOGRAPHY; Right     Comment:  Procedure: Lower Extremity Angiography;  Surgeon: Algernon Huxley, MD;  Location: [place] [facility];  Service:               Cardiovascular;  Laterality: Right; 03/24/2022: LOWER EXTREMITY ANGIOGRAPHY; Right     Comment:  Procedure: Lower Extremity Angiography;  Surgeon: Algernon Huxley, MD;  Location: [place] [facility];  Service:               Cardiovascular;  Laterality: Right; 11/19/2019: LOWER EXTREMITY INTERVENTION; N/A     Comment:  Procedure: LOWER EXTREMITY INTERVENTION;  Surgeon: Algernon Huxley, MD;  Location: [place] [facility];  Service:               Cardiovascular;  Laterality: N/A; 06/29/2021: LOWER EXTREMITY INTERVENTION; Bilateral     Comment:  Procedure: LOWER EXTREMITY INTERVENTION;  Surgeon: Algernon Huxley, MD;  Location: [place] [facility];  Service:               Cardiovascular;  Laterality: Bilateral; 06/22/2017: TEE WITHOUT CARDIOVERSION; N/A     Comment:  Procedure: TRANSESOPHAGEAL ECHOCARDIOGRAM (TEE);                Surgeon: Wellington Hampshire, MD;  Location: [facility] OR[facility];                Service: Cardiovascular;  Laterality: N/A; 07/11/2018: TOTAL KNEE ARTHROPLASTY; Right     Comment:  Procedure: TOTAL KNEE ARTHROPLASTY;  Surgeon: Corky Mull, MD;  Location: [facility] ORS;  Service: Orthopedics;                Laterality: Right; No date: VAGINAL HYSTERECTOMY 06/20/2017: VISCERAL ARTERY INTERVENTION; N/A     Comment:  Procedure: Visceral Artery Intervention, possible aortic              thrombectomy;  Surgeon: Algernon Huxley, MD;  Location: [place] [facility];  Service: Cardiovascular;  Laterality:               N/A; 01/28/2018: VISCERAL ARTERY INTERVENTION; N/A     Comment:  Procedure: VISCERAL ARTERY INTERVENTION;  Surgeon: Algernon Huxley, MD;  Location: [place] [facility];  Service:               Cardiovascular;  Laterality: N/A;  BMI    Body  Mass Index: 40.24 kg/m      Reproductive/Obstetrics negative OB ROS                           Anesthesia Physical Anesthesia Plan  ASA: 3  Anesthesia Plan: General LMA   Post-op Pain Management:    Induction: Intravenous  PONV Risk Score and Plan: 3 and Ondansetron and Midazolam  Airway Management Planned: LMA  Additional Equipment:   Intra-op Plan:   Post-operative Plan: Extubation in OR  Informed Consent: I have reviewed the patients History and Physical, chart, labs and discussed the procedure including the risks, benefits and alternatives for the proposed anesthesia with the patient or authorized representative who has indicated his/her understanding and acceptance.     Dental Advisory Given  Plan Discussed with: Anesthesiologist, CRNA and Surgeon  Anesthesia Plan Comments: (Patient consented for risks of anesthesia including but not limited to:  - adverse reactions to medications - damage to eyes, teeth, lips or other oral mucosa - nerve damage due to positioning  - sore throat or  hoarseness - Damage to heart, brain, nerves, lungs, other parts of body or loss of life  Patient voiced understanding.)       Anesthesia Quick Evaluation

## 2022-05-03 NOTE — Progress Notes (Signed)
Subjective:    Patient ID: Carla [name], female    DOB: 07/14/1961, 61 y.o.   MRN: [id number] No chief complaint on file.   Carla [name] is a 61 year old female who presents today for follow-up evaluation of her recent right below-knee amputation.  Today the wound area is red and tender for the patient.  There is some noted purulent drainage from the area.  She currently denies any fevers or chills.  The patient is currently tearful today but that is not an uncommon occurrence that the patient does suffer from severe anxiety.    Review of Systems  Skin:  Positive for wound.  Neurological:  Positive for weakness.  All other systems reviewed and are negative.      Objective:   Physical Exam Vitals reviewed.  HENT:     Head: Normocephalic.  Cardiovascular:     Rate and Rhythm: Normal rate.  Pulmonary:     Effort: Pulmonary effort is normal.  Skin:    General: Skin is warm and dry.  Neurological:     Mental Status: She is alert and oriented to person, place, and time.     Motor: Weakness present.     Gait: Gait abnormal.  Psychiatric:        Mood and Affect: Mood normal. Affect is tearful.        Behavior: Behavior normal.        Thought Content: Thought content normal.        Judgment: Judgment normal.     BP 105/69 (BP Location: Left Arm)   Pulse 70   Resp 17   Past Medical History:  Diagnosis Date   Abdominal aortic atherosclerosis ([place])    a. 05/2017 CTA abd/pelvis: significant atherosclerotic dzs of the infrarenal abd Ao w/ some mural thrombus. No aneurysm or dissection.   Acute focal ischemia of small intestine (HCC)    Acute right-sided low back pain with right-sided sciatica 06/13/2017   AKI (acute kidney injury) ([place]) 07/29/2017   Anemia    Anginal pain ([place])    a. 08/2012 Lexiscan MV: EF 54%, non ischemia/infarct.   Anxiety and depression    Baker's cyst of knee, right    a. 07/2016 U/S: 4.1 x 1.4 x 2.9 cystic structure in R poplitetal fossa.   Bell's  palsy    CAD (coronary artery disease)    CHF (congestive heart failure) ([place])    a.) TTE 06/20/2017: EF 60-65%, no rwma, G1DD, no source of cardiac emboli.   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 05/2017 CTA Abd/pelvis: apparent thrombus or embolus in prox SMA (70-90%); b. 05/2017 catheter directed tPA, mechanical thrombectomy, and stenting of the SMA.   Essential hypertension with goal blood pressure less than 140/90 01/19/2016   Gastroesophageal reflux disease 01/19/2016   GERD (gastroesophageal reflux disease)    H/O colectomy    History of 2019 novel coronavirus disease (COVID-19) 11/14/2019   History of kidney stones    Hyperlipidemia    Hypertension    Morbid obesity with BMI of 40.0-44.9, adult ([place])    NSTEMI (non-ST elevated myocardial infarction) ([place]) 06/27/2021   a.) high sensitivity troponins trended: 893 --> 1587 --> 1748 --> 868 --> 735 ng/L   Occlusive mesenteric  ischemia ([place]) 06/19/2017   Osteoarthritis    PAD (peripheral artery disease) (HCC)    PAF (paroxysmal atrial fibrillation) ([place])    a.) CHA2DS2-VASc = 6 (sex, CHF, HTN, DVT x2, aortic plaque). b.) rate/rhythm maintained on oral metoprolol tartrate; chronically anticoagulated with standard dose apixaban.   Palpitations    Peripheral edema    Personal history of other malignant neoplasm of skin 06/01/2016   Pneumonia    Pre-diabetes    Primary osteoarthritis of both knees 06/13/2017   SBO (small bowel obstruction) ([place]) 08/06/2017   Skin cancer of nose    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HCC)    Vitamin D deficiency     Social History   Socioeconomic History   Marital status: Single    Spouse name: Not on file   Number of children: Not on file   Years of education: Not on file   Highest  education level: Not on file  Occupational History   Occupation: Unemployed  Tobacco Use   Smoking status: Former    Packs/day: 1.00    Types: Cigarettes    Quit date: 2023    Years since quitting: 0.5   Smokeless tobacco: Never  Vaping Use   Vaping Use: Never used  Substance and Sexual Activity   Alcohol use: No   Drug use: No   Sexual activity: Not on file  Other Topics Concern   Not on file  Social History Narrative   Lives in [place] with daughter   Social Determinants of Health   Financial Resource Strain: Not on file  Food Insecurity: Not on file  Transportation Needs: Not on file  Physical Activity: Not on file  Stress: Not on file  Social Connections: Not on file  Intimate Partner Violence: Not on file    Past Surgical History:  Procedure Laterality Date   AMPUTATION Right 04/06/2022   Procedure: AMPUTATION ABOVE KNEE;  Surgeon: Algernon Huxley, MD;  Location: [facility];  Service: Vascular;  Laterality: Right;   AMPUTATION TOE Left 12/09/2019   Procedure: AMPUTATION TOE MPJ left;  Surgeon: Caroline More, DPM;  Location: [facility];  Service: Podiatry;  Laterality: Left;   APPENDECTOMY     APPLICATION OF WOUND VAC  12/01/2021   Procedure: APPLICATION OF WOUND VAC;  Surgeon: Algernon Huxley, MD;  Location: [facility];  Service: Vascular;;   CHOLECYSTECTOMY     COLON SURGERY     COLONOSCOPY WITH PROPOFOL N/A 03/23/2022   Procedure: COLONOSCOPY WITH PROPOFOL;  Surgeon: Jonathon Bellows, MD;  Location: [facility];  Service: Gastroenterology;  Laterality: N/A;  rectal bleed   EMBOLECTOMY OR THROMBECTOMY, WITH OR WITHOUT CATHETER; FEMOROPOPLITEAL, AORTOILIAC ARTERY, BY LEG INCISION N/A 06/21/2020   Left - Decomp Fasciotomy Leg; Ant &/Or Lat Compart Only; Midline - Angiography, Visceral, Selective Or Supraselective (With Or Without Flush Aortogram); Left - Revascularize, Endovasc, Open/Percut, Iliac Artery, Unilat, Initial Vessel; W/Translum Stent, W/Angioplasty; Right -  Revascularize, Endovasc, Open/Percut, Iliac Artery, Ea Add`L Ipsilateral; W/Translumin Stent, W/Angioplasty; Location [facility];   ENDARTERECTOMY FEMORAL Left 12/01/2021   Procedure: ENDARTERECTOMY FEMORAL;  Surgeon: Algernon Huxley, MD;  Location: [facility];  Service: Vascular;  Laterality: Left;   ENDOVASCULAR REPAIR/STENT GRAFT Left 12/01/2021   Procedure: ENDOVASCULAR REPAIR/STENT GRAFT;  Surgeon: Algernon Huxley, MD;  Location: [place] [facility];  Service: Cardiovascular;  Laterality: Left;   LAPAROTOMY N/A 08/08/2017   Procedure: EXPLORATORY LAPAROTOMY POSSIBLE BOWEL RESECTION;  Surgeon: Jules Husbands, MD;  Location: [facility];  Service: General;  Laterality: N/A;   LEFT HEART CATH AND CORONARY ANGIOGRAPHY N/A 05/17/2021   Procedure: LEFT HEART CATH AND CORONARY ANGIOGRAPHY;  Surgeon: Yolonda Kida, MD;  Location: [place] [facility];  Service: Cardiovascular;  Laterality: N/A;   LOWER EXTREMITY ANGIOGRAPHY Left 11/17/2019   Procedure: LOWER EXTREMITY ANGIOGRAPHY;  Surgeon: Algernon Huxley, MD;  Location: [place] [facility];  Service: Cardiovascular;  Laterality: Left;   LOWER EXTREMITY ANGIOGRAPHY Right 12/13/2020   Procedure: LOWER EXTREMITY ANGIOGRAPHY;  Surgeon: Algernon Huxley, MD;  Location: [place] [facility];  Service: Cardiovascular;  Laterality: Right;   LOWER EXTREMITY ANGIOGRAPHY Right 01/10/2021   Procedure: LOWER EXTREMITY ANGIOGRAPHY;  Surgeon: Algernon Huxley, MD;  Location: [place] [facility];  Service: Cardiovascular;  Laterality: Right;   LOWER EXTREMITY ANGIOGRAPHY Right 01/24/2021   Procedure: LOWER EXTREMITY ANGIOGRAPHY;  Surgeon: Algernon Huxley, MD;  Location: [place] [facility];  Service: Cardiovascular;  Laterality: Right;   LOWER EXTREMITY ANGIOGRAPHY Bilateral 06/28/2021   Procedure: Lower Extremity Angiography;  Surgeon: Algernon Huxley, MD;  Location: [place] [facility];  Service: Cardiovascular;  Laterality: Bilateral;   LOWER EXTREMITY ANGIOGRAPHY Right 11/28/2021    Procedure: LOWER EXTREMITY ANGIOGRAPHY;  Surgeon: Algernon Huxley, MD;  Location: [place] [facility];  Service: Cardiovascular;  Laterality: Right;   LOWER EXTREMITY ANGIOGRAPHY Left 11/30/2021   Procedure: Lower Extremity Angiography;  Surgeon: Algernon Huxley, MD;  Location: [place] [facility];  Service: Cardiovascular;  Laterality: Left;   LOWER EXTREMITY ANGIOGRAPHY Right 02/22/2022   Procedure: Lower Extremity Angiography;  Surgeon: Algernon Huxley, MD;  Location: [place] [facility];  Service: Cardiovascular;  Laterality: Right;   LOWER EXTREMITY ANGIOGRAPHY Right 03/13/2022   Procedure: Lower Extremity Angiography;  Surgeon: Algernon Huxley, MD;  Location: [place] [facility];  Service: Cardiovascular;  Laterality: Right;   LOWER EXTREMITY ANGIOGRAPHY Right 03/24/2022   Procedure: Lower Extremity Angiography;  Surgeon: Algernon Huxley, MD;  Location: [place] [facility];  Service: Cardiovascular;  Laterality: Right;   LOWER EXTREMITY INTERVENTION N/A 11/19/2019   Procedure: LOWER EXTREMITY INTERVENTION;  Surgeon: Algernon Huxley, MD;  Location: [place] [facility];  Service: Cardiovascular;  Laterality: N/A;   LOWER EXTREMITY INTERVENTION Bilateral 06/29/2021   Procedure: LOWER EXTREMITY INTERVENTION;  Surgeon: Algernon Huxley, MD;  Location: [place] [facility];  Service: Cardiovascular;  Laterality: Bilateral;   TEE WITHOUT CARDIOVERSION N/A 06/22/2017   Procedure: TRANSESOPHAGEAL ECHOCARDIOGRAM (TEE);  Surgeon: Wellington Hampshire, MD;  Location: [facility];  Service: Cardiovascular;  Laterality: N/A;   TOTAL KNEE ARTHROPLASTY Right 07/11/2018   Procedure: TOTAL KNEE ARTHROPLASTY;  Surgeon: Corky Mull, MD;  Location: [facility];  Service: Orthopedics;  Laterality: Right;   VAGINAL HYSTERECTOMY     VISCERAL ARTERY INTERVENTION N/A 06/20/2017   Procedure: Visceral Artery Intervention, possible aortic thrombectomy;  Surgeon: Algernon Huxley, MD;  Location: [place] [facility];  Service: Cardiovascular;  Laterality:  N/A;   VISCERAL ARTERY INTERVENTION N/A 01/28/2018   Procedure: VISCERAL ARTERY INTERVENTION;  Surgeon: Algernon Huxley, MD;  Location: [place] [facility];  Service: Cardiovascular;  Laterality: N/A;    Family History  Problem Relation Age of Onset   Hypertension Mother    Heart disease Mother    Heart attack Mother    Breast cancer Mother    Hypertension Father    Breast cancer Paternal Aunt     Allergies  Allergen Reactions   Gabapentin  Tremors    Bactrim [Sulfamethoxazole-Trimethoprim] Itching       Latest Ref Rng & Units 05/02/2022   11:58 AM 04/09/2022   11:03 AM 04/08/2022    3:11 PM  CBC  WBC 4.0 - 10.5 K/uL 7.4  8.6    Hemoglobin 12.0 - 15.0 g/dL 9.4  9.0  8.9   Hematocrit 36.0 - 46.0 % 31.2  29.2  29.2   Platelets 150 - 400 K/uL 282  239        CMP     Component Value Date/Time   NA 140 05/02/2022 1158   NA 140 02/12/2015 1137   K 4.0 05/02/2022 1158   K 2.9 (L) 02/12/2015 1137   CL 106 05/02/2022 1158   CL 107 02/12/2015 1137   CO2 26 05/02/2022 1158   CO2 27 02/12/2015 1137   GLUCOSE 103 (H) 05/02/2022 1158   GLUCOSE 120 (H) 02/12/2015 1137   BUN 12 05/02/2022 1158   BUN 15 02/12/2015 1137   CREATININE 0.84 05/02/2022 1158   CREATININE 0.78 02/12/2015 1137   CALCIUM 8.7 (L) 05/02/2022 1158   CALCIUM 9.0 02/12/2015 1137   PROT 6.4 (L) 05/02/2022 1158   PROT 7.0 09/14/2020 1139   PROT 7.1 01/19/2015 1112   ALBUMIN 2.8 (L) 05/02/2022 1158   ALBUMIN 3.9 09/14/2020 1139   ALBUMIN 3.8 01/19/2015 1112   AST 15 05/02/2022 1158   AST 17 01/19/2015 1112   ALT 10 05/02/2022 1158   ALT 11 (L) 01/19/2015 1112   ALKPHOS 105 05/02/2022 1158   ALKPHOS 84 01/19/2015 1112   BILITOT 0.4 05/02/2022 1158   BILITOT <0.2 09/14/2020 1139   BILITOT 0.5 01/19/2015 1112   GFRNONAA >60 05/02/2022 1158   GFRNONAA >60 02/12/2015 1137   GFRAA >60 11/23/2019 1330   GFRAA >60 02/12/2015 1137     VAS [place] ABI WITH/WO TBI  Result Date: 03/09/2022  LOWER EXTREMITY  DOPPLER STUDY Patient Name:  JING HOWATT  Date of Exam:   03/07/2022 Medical Rec #: [id number]      Accession #:    [id number] Date of Birth: 1961-07-22       Patient Gender: F Patient Age:   32 years Exam Location:  [facility] Procedure:      VAS [place] ABI WITH/WO TBI Referring Phys: Arna Medici Tomicka Lover --------------------------------------------------------------------------------  Indications: LLE pain.  Vascular Interventions: 12/13/20: right cia,eia thrombectomy                         01/10/21: rt thrombectomy new bilat kissing stents in                         cia's, addtl rt eia stent;                         01/24/21: repeat thrombectomy right;                         06/29/21: Right CIA/EIA thrombectomy with bilateral CIA                         PTAs;                         11/28/21: Left CIA/EIA thrombectomy/PTA;  12/01/2021: Left CFA,PFA and SFA Endartectomies and                         patch. Performing Technologist: Blondell Reveal RT, RDMS, RVT  Examination Guidelines: A complete evaluation includes at minimum, Doppler waveform signals and systolic blood pressure reading at the level of bilateral brachial, anterior tibial, and posterior tibial arteries, when vessel segments are accessible. Bilateral testing is considered an integral part of a complete examination. Photoelectric Plethysmograph (PPG) waveforms and toe systolic pressure readings are included as required and additional duplex testing as needed. Limited examinations for reoccurring indications may be performed as noted.  ABI Findings: +---------+------------------+-----+------------+--------+ Right    Rt Pressure (mmHg)IndexWaveform    Comment  +---------+------------------+-----+------------+--------+ Brachial 136                                         +---------+------------------+-----+------------+--------+ ATA      64                0.46 monophasic            +---------+------------------+-----+------------+--------+ PTA                             not detected         +---------+------------------+-----+------------+--------+ PERO     62                0.45 monophasic           +---------+------------------+-----+------------+--------+ Great Toe40                0.29 Abnormal             +---------+------------------+-----+------------+--------+ +---------+------------------+-----+----------+-------+ Left     Lt Pressure (mmHg)IndexWaveform  Comment +---------+------------------+-----+----------+-------+ Brachial 139                                      +---------+------------------+-----+----------+-------+ ATA      103               0.74 monophasic        +---------+------------------+-----+----------+-------+ PTA      67                0.48 monophasic        +---------+------------------+-----+----------+-------+ Great Toe58                0.42 Normal            +---------+------------------+-----+----------+-------+ +-------+-----------+-----------+------------+------------+ ABI/TBIToday's ABIToday's TBIPrevious ABIPrevious TBI +-------+-----------+-----------+------------+------------+ Right  0.46       0.29       0.63        0.61         +-------+-----------+-----------+------------+------------+ Left   0.74       0.42       0.75        0.75         +-------+-----------+-----------+------------+------------+ Right ABIs appear decreased compared to prior study on 02/15/22. Left ABIs appear essentially unchanged.  Summary: Right: Resting right ankle-brachial index indicates severe right lower extremity arterial disease. The right toe-brachial index is abnormal. Left: Resting left ankle-brachial index indicates moderate left lower extremity arterial disease. The left toe-brachial index is abnormal. *See table(s) above for measurements and observations.  Electronically signed  by [name] MD on 03/09/2022  at 12:24:25 PM.    Final        Assessment & Plan:   1. S/P AKA (above knee amputation) unilateral, right (HCC) We will culture the patient's wound.  Will not remove staples as of yet.  We will also send the patient with Bactrim to try to treat the wound prior to the cultures returning.  We will plan to have the patient return in 2 weeks for further staple removal and wound reevaluation.   No current facility-administered medications on file prior to visit.   Current Outpatient Medications on File Prior to Visit  Medication Sig Dispense Refill   Amino Acids-Protein Hydrolys (PRO-STAT) LIQD Take 30 mLs by mouth in the morning and at bedtime.     amLODipine (NORVASC) 5 MG tablet Take 5 mg by mouth daily.     apixaban (ELIQUIS) 5 MG TABS tablet Take 1 tablet (5 mg total) by mouth 2 (two) times daily. 60 tablet 11   aspirin EC 81 MG tablet Take 81 mg by mouth daily. Swallow whole.     atorvastatin (LIPITOR) 80 MG tablet Take 80 mg by mouth at bedtime.     ciprofloxacin (CIPRO) 500 MG tablet Take 500 mg by mouth 2 (two) times daily.     citalopram (CELEXA) 40 MG tablet Take 1 tablet (40 mg total) by mouth daily. 30 tablet 2   COMBIVENT RESPIMAT 20-100 MCG/ACT AERS respimat      ferrous sulfate 325 (65 FE) MG tablet Take 325 mg by mouth daily.     furosemide (LASIX) 40 MG tablet Take 40 mg by mouth daily.     isosorbide mononitrate (IMDUR) 60 MG 24 hr tablet Take 60 mg by mouth daily.     methocarbamol (ROBAXIN) 500 MG tablet Take 1 tablet (500 mg total) by mouth every 8 (eight) hours as needed for muscle spasms. 30 tablet 0   metoprolol tartrate (LOPRESSOR) 50 MG tablet Take 1 tablet (50 mg total) by mouth 2 (two) times daily. 30 tablet 0   morphine (MS CONTIN) 15 MG 12 hr tablet Take 15 mg by mouth every 8 (eight) hours.     Multiple Vitamin (MULTIVITAMIN WITH MINERALS) TABS tablet Take 1 tablet by mouth daily. 30 tablet 0   nitroGLYCERIN (NITROSTAT) 0.4 MG SL tablet Place 0.4 mg under the  tongue as directed.     omeprazole (PRILOSEC) 20 MG capsule Take 20 mg by mouth 2 (two) times daily before a meal.     ondansetron (ZOFRAN) 4 MG tablet Take 4 mg by mouth every 8 (eight) hours as needed for nausea or vomiting.     oxyCODONE-acetaminophen (PERCOCET/ROXICET) 5-325 MG tablet Take 1-2 tablets by mouth every 6 (six) hours as needed for moderate pain. 30 tablet 0   pregabalin (LYRICA) 100 MG capsule Take 1 capsule (100 mg total) by mouth 3 (three) times daily. 60 capsule 0   traMADol (ULTRAM) 50 MG tablet Take 50 mg by mouth every 6 (six) hours as needed.     doxycycline (VIBRAMYCIN) 100 MG capsule Take 1 capsule (100 mg total) by mouth 2 (two) times daily. (Patient not taking: Reported on 05/01/2022) 28 capsule 0    There are no Patient Instructions on file for this visit. No follow-ups on file.   Kris Hartmann, NP

## 2022-05-03 NOTE — Progress Notes (Signed)
Subjective:    Patient ID: Carla [name], female    DOB: August 25, 1961, 61 y.o.   MRN: [id number] Chief Complaint  Patient presents with   Routine Post Op    2week staple removal    The patient returns today for follow-up wound evaluation.  The patient's nursing facility contacted our office to let us know that the wound began to dehisce.  The patient's wound culture returned with Pseudomonas and an updated prescription for Cipro 750 milligrams twice daily was sent into her nursing facility.  The patient notes that she has been taking the insulin as several days left.  The wound itself has dehisced.  It is mostly superficial but reports towards the medial portion there is a approximate 2.5 to 3cm deep area.  Currently we are unable to probe bone.    Review of Systems  Skin:  Positive for wound.  All other systems reviewed and are negative.      Objective:   Physical Exam Vitals reviewed.  HENT:     Head: Normocephalic.  Cardiovascular:     Rate and Rhythm: Normal rate.  Pulmonary:     Effort: Pulmonary effort is normal.  Musculoskeletal:     Right Lower Extremity: Right leg is amputated above knee.  Skin:    General: Skin is warm and dry.  Neurological:     Mental Status: She is alert and oriented to person, place, and time.  Psychiatric:        Mood and Affect: Mood normal.        Behavior: Behavior normal.        Thought Content: Thought content normal.        Judgment: Judgment normal.     BP 110/63 (BP Location: Right Arm)   Pulse 73   Resp 16   Past Medical History:  Diagnosis Date   Abdominal aortic atherosclerosis ([place])    a. 05/2017 CTA abd/pelvis: significant atherosclerotic dzs of the infrarenal abd Ao w/ some mural thrombus. No aneurysm or dissection.   Acute focal ischemia of small intestine (HCC)    Acute right-sided low back pain with right-sided sciatica 06/13/2017   AKI (acute kidney injury) ([place]) 07/29/2017   Anemia    Anginal pain ([place])    a.  08/2012 Lexiscan MV: EF 54%, non ischemia/infarct.   Anxiety and depression    Baker's cyst of knee, right    a. 07/2016 U/S: 4.1 x 1.4 x 2.9 cystic structure in R poplitetal fossa.   Bell's palsy    CAD (coronary artery disease)    CHF (congestive heart failure) ([place])    a.) TTE 06/20/2017: EF 60-65%, no rwma, G1DD, no source of cardiac emboli.   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 05/2017 CTA Abd/pelvis: apparent thrombus or embolus in prox SMA (70-90%); b. 05/2017 catheter directed tPA, mechanical thrombectomy, and stenting of the SMA.   Essential hypertension with goal blood pressure less than 140/90 01/19/2016   Gastroesophageal reflux disease 01/19/2016   GERD (gastroesophageal reflux disease)    H/O colectomy    History of 2019 novel coronavirus disease (COVID-19) 11/14/2019   History of kidney stones    Hyperlipidemia    Hypertension    Morbid obesity with BMI of  40.0-44.9, adult [facility])    NSTEMI (non-ST elevated myocardial infarction) ([place]) 06/27/2021   a.) high sensitivity troponins trended: 893 --> 1587 --> 1748 --> 868 --> 735 ng/L   Occlusive mesenteric ischemia (HCC) 06/19/2017   Osteoarthritis    PAD (peripheral artery disease) (HCC)    PAF (paroxysmal atrial fibrillation) (HCC)    a.) CHA2DS2-VASc = 6 (sex, CHF, HTN, DVT x2, aortic plaque). b.) rate/rhythm maintained on oral metoprolol tartrate; chronically anticoagulated with standard dose apixaban.   Palpitations    Peripheral edema    Personal history of other malignant neoplasm of skin 06/01/2016   Pneumonia    Pre-diabetes    Primary osteoarthritis of both knees 06/13/2017   SBO (small bowel obstruction) ([place]) 08/06/2017   Skin cancer of nose    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HCC)    Vitamin D  deficiency     Social History   Socioeconomic History   Marital status: Single    Spouse name: Not on file   Number of children: Not on file   Years of education: Not on file   Highest education level: Not on file  Occupational History   Occupation: Unemployed  Tobacco Use   Smoking status: Former    Packs/day: 1.00    Types: Cigarettes    Quit date: 2023    Years since quitting: 0.5   Smokeless tobacco: Never  Vaping Use   Vaping Use: Never used  Substance and Sexual Activity   Alcohol use: No   Drug use: No   Sexual activity: Not on file  Other Topics Concern   Not on file  Social History Narrative   Lives in [place] with daughter   Social Determinants of Health   Financial Resource Strain: Not on file  Food Insecurity: Not on file  Transportation Needs: Not on file  Physical Activity: Not on file  Stress: Not on file  Social Connections: Not on file  Intimate Partner Violence: Not on file    Past Surgical History:  Procedure Laterality Date   AMPUTATION Right 04/06/2022   Procedure: AMPUTATION ABOVE KNEE;  Surgeon: Algernon Huxley, MD;  Location: [facility] ORS;  Service: Vascular;  Laterality: Right;   AMPUTATION TOE Left 12/09/2019   Procedure: AMPUTATION TOE MPJ left;  Surgeon: Caroline More, DPM;  Location: [facility];  Service: Podiatry;  Laterality: Left;   APPENDECTOMY     APPLICATION OF WOUND VAC  12/01/2021   Procedure: APPLICATION OF WOUND VAC;  Surgeon: Algernon Huxley, MD;  Location: [facility];  Service: Vascular;;   CHOLECYSTECTOMY     COLON SURGERY     COLONOSCOPY WITH PROPOFOL N/A 03/23/2022   Procedure: COLONOSCOPY WITH PROPOFOL;  Surgeon: Jonathon Bellows, MD;  Location: [facility];  Service: Gastroenterology;  Laterality: N/A;  rectal bleed   EMBOLECTOMY OR THROMBECTOMY, WITH OR WITHOUT CATHETER; FEMOROPOPLITEAL, AORTOILIAC ARTERY, BY LEG INCISION N/A 06/21/2020   Left - Decomp Fasciotomy Leg; Ant &/Or Lat Compart Only; Midline - Angiography, Visceral,  Selective Or Supraselective (With Or Without Flush Aortogram); Left - Revascularize, Endovasc, Open/Percut, Iliac Artery, Unilat, Initial Vessel; W/Translum Stent, W/Angioplasty; Right - Revascularize, Endovasc, Open/Percut, Iliac Artery, Ea Add`L Ipsilateral; W/Translumin Stent, W/Angioplasty; Location [facility];   ENDARTERECTOMY FEMORAL Left 12/01/2021   Procedure: ENDARTERECTOMY FEMORAL;  Surgeon: Algernon Huxley, MD;  Location: [facility];  Service: Vascular;  Laterality: Left;   ENDOVASCULAR REPAIR/STENT GRAFT Left 12/01/2021   Procedure: ENDOVASCULAR REPAIR/STENT GRAFT;  Surgeon: Algernon Huxley, MD;  Location: [place]  [facility];  Service: Cardiovascular;  Laterality: Left;   LAPAROTOMY N/A 08/08/2017   Procedure: EXPLORATORY LAPAROTOMY POSSIBLE BOWEL RESECTION;  Surgeon: Jules Husbands, MD;  Location: [facility];  Service: General;  Laterality: N/A;   LEFT HEART CATH AND CORONARY ANGIOGRAPHY N/A 05/17/2021   Procedure: LEFT HEART CATH AND CORONARY ANGIOGRAPHY;  Surgeon: Yolonda Kida, MD;  Location: [place] [facility];  Service: Cardiovascular;  Laterality: N/A;   LOWER EXTREMITY ANGIOGRAPHY Left 11/17/2019   Procedure: LOWER EXTREMITY ANGIOGRAPHY;  Surgeon: Algernon Huxley, MD;  Location: [place] [facility];  Service: Cardiovascular;  Laterality: Left;   LOWER EXTREMITY ANGIOGRAPHY Right 12/13/2020   Procedure: LOWER EXTREMITY ANGIOGRAPHY;  Surgeon: Algernon Huxley, MD;  Location: [place] [facility];  Service: Cardiovascular;  Laterality: Right;   LOWER EXTREMITY ANGIOGRAPHY Right 01/10/2021   Procedure: LOWER EXTREMITY ANGIOGRAPHY;  Surgeon: Algernon Huxley, MD;  Location: [place] [facility];  Service: Cardiovascular;  Laterality: Right;   LOWER EXTREMITY ANGIOGRAPHY Right 01/24/2021   Procedure: LOWER EXTREMITY ANGIOGRAPHY;  Surgeon: Algernon Huxley, MD;  Location: [place] [facility];  Service: Cardiovascular;  Laterality: Right;   LOWER EXTREMITY ANGIOGRAPHY Bilateral 06/28/2021   Procedure: Lower  Extremity Angiography;  Surgeon: Algernon Huxley, MD;  Location: [place] [facility];  Service: Cardiovascular;  Laterality: Bilateral;   LOWER EXTREMITY ANGIOGRAPHY Right 11/28/2021   Procedure: LOWER EXTREMITY ANGIOGRAPHY;  Surgeon: Algernon Huxley, MD;  Location: [place] CV [facility];  Service: Cardiovascular;  Laterality: Right;   LOWER EXTREMITY ANGIOGRAPHY Left 11/30/2021   Procedure: Lower Extremity Angiography;  Surgeon: Algernon Huxley, MD;  Location: [place] [facility];  Service: Cardiovascular;  Laterality: Left;   LOWER EXTREMITY ANGIOGRAPHY Right 02/22/2022   Procedure: Lower Extremity Angiography;  Surgeon: Algernon Huxley, MD;  Location: [place] [facility];  Service: Cardiovascular;  Laterality: Right;   LOWER EXTREMITY ANGIOGRAPHY Right 03/13/2022   Procedure: Lower Extremity Angiography;  Surgeon: Algernon Huxley, MD;  Location: [place] [facility];  Service: Cardiovascular;  Laterality: Right;   LOWER EXTREMITY ANGIOGRAPHY Right 03/24/2022   Procedure: Lower Extremity Angiography;  Surgeon: Algernon Huxley, MD;  Location: [place] [facility];  Service: Cardiovascular;  Laterality: Right;   LOWER EXTREMITY INTERVENTION N/A 11/19/2019   Procedure: LOWER EXTREMITY INTERVENTION;  Surgeon: Algernon Huxley, MD;  Location: [place] [facility];  Service: Cardiovascular;  Laterality: N/A;   LOWER EXTREMITY INTERVENTION Bilateral 06/29/2021   Procedure: LOWER EXTREMITY INTERVENTION;  Surgeon: Algernon Huxley, MD;  Location: [place] [facility];  Service: Cardiovascular;  Laterality: Bilateral;   TEE WITHOUT CARDIOVERSION N/A 06/22/2017   Procedure: TRANSESOPHAGEAL ECHOCARDIOGRAM (TEE);  Surgeon: Wellington Hampshire, MD;  Location: [facility];  Service: Cardiovascular;  Laterality: N/A;   TOTAL KNEE ARTHROPLASTY Right 07/11/2018   Procedure: TOTAL KNEE ARTHROPLASTY;  Surgeon: Corky Mull, MD;  Location: [facility];  Service: Orthopedics;  Laterality: Right;   VAGINAL HYSTERECTOMY     VISCERAL ARTERY INTERVENTION N/A  06/20/2017   Procedure: Visceral Artery Intervention, possible aortic thrombectomy;  Surgeon: Algernon Huxley, MD;  Location: [place] [facility];  Service: Cardiovascular;  Laterality: N/A;   VISCERAL ARTERY INTERVENTION N/A 01/28/2018   Procedure: VISCERAL ARTERY INTERVENTION;  Surgeon: Algernon Huxley, MD;  Location: [place] [facility];  Service: Cardiovascular;  Laterality: N/A;    Family History  Problem Relation Age of Onset   Hypertension Mother    Heart disease Mother  Heart attack Mother    Breast cancer Mother    Hypertension Father    Breast cancer Paternal Aunt     Allergies  Allergen Reactions   Gabapentin     Tremors    Bactrim [Sulfamethoxazole-Trimethoprim] Itching       Latest Ref Rng & Units 05/02/2022   11:58 AM 04/09/2022   11:03 AM 04/08/2022    3:11 PM  CBC  WBC 4.0 - 10.5 K/uL 7.4  8.6    Hemoglobin 12.0 - 15.0 g/dL 9.4  9.0  8.9   Hematocrit 36.0 - 46.0 % 31.2  29.2  29.2   Platelets 150 - 400 K/uL 282  239        CMP     Component Value Date/Time   NA 140 05/02/2022 1158   NA 140 02/12/2015 1137   K 4.0 05/02/2022 1158   K 2.9 (L) 02/12/2015 1137   CL 106 05/02/2022 1158   CL 107 02/12/2015 1137   CO2 26 05/02/2022 1158   CO2 27 02/12/2015 1137   GLUCOSE 103 (H) 05/02/2022 1158   GLUCOSE 120 (H) 02/12/2015 1137   BUN 12 05/02/2022 1158   BUN 15 02/12/2015 1137   CREATININE 0.84 05/02/2022 1158   CREATININE 0.78 02/12/2015 1137   CALCIUM 8.7 (L) 05/02/2022 1158   CALCIUM 9.0 02/12/2015 1137   PROT 6.4 (L) 05/02/2022 1158   PROT 7.0 09/14/2020 1139   PROT 7.1 01/19/2015 1112   ALBUMIN 2.8 (L) 05/02/2022 1158   ALBUMIN 3.9 09/14/2020 1139   ALBUMIN 3.8 01/19/2015 1112   AST 15 05/02/2022 1158   AST 17 01/19/2015 1112   ALT 10 05/02/2022 1158   ALT 11 (L) 01/19/2015 1112   ALKPHOS 105 05/02/2022 1158   ALKPHOS 84 01/19/2015 1112   BILITOT 0.4 05/02/2022 1158   BILITOT <0.2 09/14/2020 1139   BILITOT 0.5 01/19/2015 1112   GFRNONAA  >60 05/02/2022 1158   GFRNONAA >60 02/12/2015 1137   GFRAA >60 11/23/2019 1330   GFRAA >60 02/12/2015 1137     VAS [place] ABI WITH/WO TBI  Result Date: 03/09/2022  LOWER EXTREMITY DOPPLER STUDY Patient Name:  Carla [name]  Date of Exam:   03/07/2022 Medical Rec #: [id number]      Accession #:    [id number] Date of Birth: 03/20/1961       Patient Gender: F Patient Age:   79 years Exam Location:  [facility] Procedure:      VAS [place] ABI WITH/WO TBI Referring Phys: Arna Medici Yoselin Amerman --------------------------------------------------------------------------------  Indications: LLE pain.  Vascular Interventions: 12/13/20: right cia,eia thrombectomy                         01/10/21: rt thrombectomy new bilat kissing stents in                         cia's, addtl rt eia stent;                         01/24/21: repeat thrombectomy right;                         06/29/21: Right CIA/EIA thrombectomy with bilateral CIA                         PTAs;  11/28/21: Left CIA/EIA thrombectomy/PTA;                         12/01/2021: Left CFA,PFA and SFA Endartectomies and                         patch. Performing Technologist: Blondell Reveal RT, RDMS, RVT  Examination Guidelines: A complete evaluation includes at minimum, Doppler waveform signals and systolic blood pressure reading at the level of bilateral brachial, anterior tibial, and posterior tibial arteries, when vessel segments are accessible. Bilateral testing is considered an integral part of a complete examination. Photoelectric Plethysmograph (PPG) waveforms and toe systolic pressure readings are included as required and additional duplex testing as needed. Limited examinations for reoccurring indications may be performed as noted.  ABI Findings: +---------+------------------+-----+------------+--------+ Right    Rt Pressure (mmHg)IndexWaveform    Comment  +---------+------------------+-----+------------+--------+ Brachial 136                                          +---------+------------------+-----+------------+--------+ ATA      64                0.46 monophasic           +---------+------------------+-----+------------+--------+ PTA                             not detected         +---------+------------------+-----+------------+--------+ PERO     62                0.45 monophasic           +---------+------------------+-----+------------+--------+ Great Toe40                0.29 Abnormal             +---------+------------------+-----+------------+--------+ +---------+------------------+-----+----------+-------+ Left     Lt Pressure (mmHg)IndexWaveform  Comment +---------+------------------+-----+----------+-------+ Brachial 139                                      +---------+------------------+-----+----------+-------+ ATA      103               0.74 monophasic        +---------+------------------+-----+----------+-------+ PTA      67                0.48 monophasic        +---------+------------------+-----+----------+-------+ Great Toe58                0.42 Normal            +---------+------------------+-----+----------+-------+ +-------+-----------+-----------+------------+------------+ ABI/TBIToday's ABIToday's TBIPrevious ABIPrevious TBI +-------+-----------+-----------+------------+------------+ Right  0.46       0.29       0.63        0.61         +-------+-----------+-----------+------------+------------+ Left   0.74       0.42       0.75        0.75         +-------+-----------+-----------+------------+------------+ Right ABIs appear decreased compared to prior study on 02/15/22. Left ABIs appear essentially unchanged.  Summary: Right: Resting right ankle-brachial index indicates severe right lower extremity arterial disease. The right toe-brachial index is abnormal.  Left: Resting left ankle-brachial index indicates moderate left lower extremity arterial disease.  The left toe-brachial index is abnormal. *See table(s) above for measurements and observations.  Electronically signed by [name] MD on 03/09/2022 at 12:24:25 PM.    Final        Assessment & Plan:   1. Wound infection The patient has obvious signs symptoms of dehiscence of the wound.  The patient still has oral ciprofloxacin but appropriately does not have any foul-smelling odor or significant purulent drainage.  We will plan on trying to debride and place a wound VAC on her stump this week.  We have discussed the risk benefits alternatives and the patient is agreeable to proceed.   No current facility-administered medications on file prior to visit.   Current Outpatient Medications on File Prior to Visit  Medication Sig Dispense Refill   Amino Acids-Protein Hydrolys (PRO-STAT) LIQD Take 30 mLs by mouth in the morning and at bedtime.     amLODipine (NORVASC) 5 MG tablet Take 5 mg by mouth daily.     apixaban (ELIQUIS) 5 MG TABS tablet Take 1 tablet (5 mg total) by mouth 2 (two) times daily. 60 tablet 11   aspirin EC 81 MG tablet Take 81 mg by mouth daily. Swallow whole.     atorvastatin (LIPITOR) 80 MG tablet Take 80 mg by mouth at bedtime.     citalopram (CELEXA) 40 MG tablet Take 1 tablet (40 mg total) by mouth daily. 30 tablet 2   COMBIVENT RESPIMAT 20-100 MCG/ACT AERS respimat      ferrous sulfate 325 (65 FE) MG tablet Take 325 mg by mouth daily.     furosemide (LASIX) 40 MG tablet Take 40 mg by mouth daily.     isosorbide mononitrate (IMDUR) 60 MG 24 hr tablet Take 60 mg by mouth daily.     methocarbamol (ROBAXIN) 500 MG tablet Take 1 tablet (500 mg total) by mouth every 8 (eight) hours as needed for muscle spasms. 30 tablet 0   metoprolol tartrate (LOPRESSOR) 50 MG tablet Take 1 tablet (50 mg total) by mouth 2 (two) times daily. 30 tablet 0   morphine (MS CONTIN) 15 MG 12 hr tablet Take 15 mg by mouth every 8 (eight) hours.     Multiple Vitamin (MULTIVITAMIN WITH MINERALS) TABS  tablet Take 1 tablet by mouth daily. 30 tablet 0   nitroGLYCERIN (NITROSTAT) 0.4 MG SL tablet Place 0.4 mg under the tongue as directed.     omeprazole (PRILOSEC) 20 MG capsule Take 20 mg by mouth 2 (two) times daily before a meal.     oxyCODONE-acetaminophen (PERCOCET/ROXICET) 5-325 MG tablet Take 1-2 tablets by mouth every 6 (six) hours as needed for moderate pain. 30 tablet 0   pregabalin (LYRICA) 100 MG capsule Take 1 capsule (100 mg total) by mouth 3 (three) times daily. 60 capsule 0   traMADol (ULTRAM) 50 MG tablet Take 50 mg by mouth every 6 (six) hours as needed.      There are no Patient Instructions on file for this visit. No follow-ups on file.   Kris Hartmann, NP

## 2022-05-03 NOTE — Interval H&P Note (Signed)
History and Physical Interval Note:  05/03/2022 3:52 PM  Carla [name]  has presented today for surgery, with the diagnosis of Right Stump Wound .  The various methods of treatment have been discussed with the patient and family. After consideration of risks, benefits and other options for treatment, the patient has consented to  Procedure(s): IRRIGATION AND DEBRIDEMENT RIGHT STUMP (Right) APPLICATION OF WOUND VAC (Right) as a surgical intervention.  The patient's history has been reviewed, patient examined, no change in status, stable for surgery.  I have reviewed the patient's chart and labs.  Questions were answered to the patient's satisfaction.     [name]

## 2022-05-04 ENCOUNTER — Encounter: Payer: Self-pay | Admitting: Vascular Surgery

## 2022-05-04 DIAGNOSIS — T148XXA Other injury of unspecified body region, initial encounter: Secondary | ICD-10-CM | POA: Diagnosis not present

## 2022-05-04 DIAGNOSIS — L089 Local infection of the skin and subcutaneous tissue, unspecified: Secondary | ICD-10-CM | POA: Diagnosis not present

## 2022-05-04 LAB — CBC
HCT: 30.8 % — ABNORMAL LOW (ref 36.0–46.0)
Hemoglobin: 9.4 g/dL — ABNORMAL LOW (ref 12.0–15.0)
MCH: 26.6 pg (ref 26.0–34.0)
MCHC: 30.5 g/dL (ref 30.0–36.0)
MCV: 87.3 fL (ref 80.0–100.0)
Platelets: 275 10*3/uL (ref 150–400)
RBC: 3.53 MIL/uL — ABNORMAL LOW (ref 3.87–5.11)
RDW: 19 % — ABNORMAL HIGH (ref 11.5–15.5)
WBC: 5.1 10*3/uL (ref 4.0–10.5)
nRBC: 0 % (ref 0.0–0.2)

## 2022-05-04 LAB — CREATININE, SERUM
Creatinine, Ser: 0.65 mg/dL (ref 0.44–1.00)
GFR, Estimated: >60 mL/min (ref >60)

## 2022-05-04 MED ORDER — CIPROFLOXACIN HCL 500 MG PO TABS
500.0000 mg | ORAL_TABLET | Freq: Two times a day (BID) | ORAL | Status: DC
  Administered 2022-05-04 – 2022-05-06 (×4): 500 mg via ORAL
  Filled 2022-05-04 (×4): qty 1

## 2022-05-04 MED ORDER — OXYCODONE-ACETAMINOPHEN 5-325 MG PO TABS
1.0000 | ORAL_TABLET | ORAL | Status: DC | PRN
  Administered 2022-05-04 – 2022-05-06 (×7): 2 via ORAL
  Filled 2022-05-04 (×7): qty 2

## 2022-05-04 MED ORDER — APIXABAN 5 MG PO TABS
5.0000 mg | ORAL_TABLET | Freq: Two times a day (BID) | ORAL | Status: DC
  Administered 2022-05-04 – 2022-05-06 (×4): 5 mg via ORAL
  Filled 2022-05-04 (×4): qty 1

## 2022-05-04 NOTE — Progress Notes (Signed)
[facility]  Daily Progress Note   Subjective  -   Patient notes some discomfort with wound VAC but is able to tolerate with pain medication.  Objective Vitals:   05/04/22 1221 05/04/22 1546 05/04/22 1951 05/04/22 2304  BP: 124/64 133/67 132/64 113/60  Pulse: 77 73 75 76  Resp: '18 18 18   '$ Temp: 98.2 F (36.8 C) 99.2 F (37.3 C) 98.3 F (36.8 C)   TempSrc: Oral Oral    SpO2: 99% 98% 98% 96%  Weight:      Height:        Intake/Output Summary (Last 24 hours) at 05/04/2022 2320 Last data filed at 05/04/2022 1952 Gross per 24 hour  Intake 700 ml  Output 1550 ml  Net -850 ml    PULM  CTAB CV  RRR VASC  wound VAC to right above-knee amputation with good seal  Laboratory CBC    Component Value Date/Time   WBC 5.1 05/04/2022 0539   HGB 9.4 (L) 05/04/2022 0539   HGB 13.4 02/12/2015 1137   HCT 30.8 (L) 05/04/2022 0539   HCT 40.9 02/12/2015 1137   PLT 275 05/04/2022 0539   PLT 272 02/12/2015 1137    BMET    Component Value Date/Time   NA 142 05/03/2022 0444   NA 140 02/12/2015 1137   K 3.9 05/03/2022 0444   K 2.9 (L) 02/12/2015 1137   CL 109 05/03/2022 0444   CL 107 02/12/2015 1137   CO2 27 05/03/2022 0444   CO2 27 02/12/2015 1137   GLUCOSE 108 (H) 05/03/2022 0444   GLUCOSE 120 (H) 02/12/2015 1137   BUN 15 05/03/2022 0444   BUN 15 02/12/2015 1137   CREATININE 0.65 05/04/2022 0539   CREATININE 0.78 02/12/2015 1137   CALCIUM 8.5 (L) 05/03/2022 0444   CALCIUM 9.0 02/12/2015 1137   GFRNONAA >60 05/04/2022 0539   GFRNONAA >60 02/12/2015 1137   GFRAA >60 11/23/2019 1330   GFRAA >60 02/12/2015 1137    Assessment/Planning: POD #1 s/p right above-knee amputation debridement and wound VAC placement  Patient is resting comfortably, tearful about the possibility of returning to rehab.  Discussed with patient that it may be reasonable for her to go home with home health versus returning to rehab. Right arm swollen with IV.  Suspect  infiltration.  Advised RN to remove and replace.   Kris Hartmann  05/04/2022, 11:20 PM

## 2022-05-04 NOTE — Progress Notes (Signed)
[place] at [place] NAME: Carla [name]    MR#:  [id number]  DATE OF BIRTH:  20-Aug-1961  SUBJECTIVE:  Pt sitting up and eating her salad earlier.  Tells me she does not want to go to [facility]    VITALS:  Blood pressure 124/64, pulse 77, temperature 98.2 F (36.8 C), temperature source Oral, resp. rate 18, height '5\' 2"'$  (1.575 m), weight 99.8 kg, SpO2 99 %.  PHYSICAL EXAMINATION:   GENERAL:  61 y.o.-year-old patient lying in the bed with no acute distress.  LUNGS: Normal breath sounds bilaterally, no wheezing, rales, rhonchi.  CARDIOVASCULAR: S1, S2 normal. No murmurs, rubs, or gallops.  ABDOMEN: Soft, nontender, nondistended. Bowel sounds present.  On admission EXTREMITIES:     NEUROLOGIC: nonfocal  patient is alert and awake SKIN: as above  LABORATORY PANEL:  CBC Recent Labs  Lab 05/04/22 0539  WBC 5.1  HGB 9.4*  HCT 30.8*  PLT 275     Chemistries  Recent Labs  Lab 05/03/22 0444 05/04/22 0539  NA 142  --   K 3.9  --   CL 109  --   CO2 27  --   GLUCOSE 108*  --   BUN 15  --   CREATININE 0.68 0.65  CALCIUM 8.5*  --   MG 2.4  --   AST 10*  --   ALT 10  --   ALKPHOS 93  --   BILITOT 0.6  --     Cardiac Enzymes No results for input(s): "TROPONINI" in the last 168 hours. RADIOLOGY:  No results found.  Assessment and Plan Carla [name] is a 61 y.o. female with medical history significant of  PAD s/p right AKA, hypertension, hyperlipidemia, iron deficiency anemia, CAD, GERD who presents to the emergency department via EMS from SNF due to wound at the stump of right AKA, she was seen at the vascular clinic about a week ago and was started on oral antibiotics.  She followed up yesterday and it was noted that the infection was worse and the patient may need surgical intervention.  Right AKA. amputation stump infection with outpatient treatment failure -- patient seen by Dr. Lucky Cowboy --she is s/p irrigation and debridement --  continue broad-spectrum antibiotic for today was discussed with vascular and narrow down--change to po ciprofloxacin (according to recent wound culture from 04/21/22) -- PRN pain meds -- patient will need wound VAC at discharge per Dr. Lucky Cowboy. Dressing will need to be changed twice a week.  acute hypoxic respiratory failure suspect COPD exacerbation -- patient does not use chronic oxygen -sats were 87% on room air -- no respiratory distress wean to room air as tolerated -- PRN bronchodilators --pt is 94-99% on RA  Hypertension -- BP was soft hold meds  PAD/CAD -- continue Lipitor and aspirin -- patient also in eliquis-- now resumed  iron deficiency anemia -- continue ferrous sulfate  PT/OT to see patient    Procedures: Family communication : none Consults : vascular CODE STATUS: full DVT Prophylaxis : heparin Level of care: Med-Surg Status is: Inpatient Remains inpatient appropriate because: amputation stump infection.  TOC for discharge planning. PT/OT to see pt    TOTAL TIME TAKING CARE OF THIS PATIENT: 30 minutes.  >50% time spent on counselling and coordination of care  Note: This dictation was prepared with Dragon dictation along with smaller phrase technology. Any transcriptional errors that result from this process are unintentional.  Norville Haggard.D  [facility]   CC: Primary care physician; Center, [facility]

## 2022-05-05 ENCOUNTER — Ambulatory Visit (INDEPENDENT_AMBULATORY_CARE_PROVIDER_SITE_OTHER): Payer: Medicare Other | Admitting: Nurse Practitioner

## 2022-05-05 DIAGNOSIS — T148XXA Other injury of unspecified body region, initial encounter: Secondary | ICD-10-CM | POA: Diagnosis not present

## 2022-05-05 DIAGNOSIS — L089 Local infection of the skin and subcutaneous tissue, unspecified: Secondary | ICD-10-CM | POA: Diagnosis not present

## 2022-05-05 NOTE — Progress Notes (Signed)
[place] at [place] NAME: Carla [name]    MR#:  [id number]  DATE OF BIRTH:  04-28-1961  SUBJECTIVE:  Pt sitting up in the bed. Overall doing well. She is tearful and requesting that she really wants to go home.   VITALS:  Blood pressure (!) 106/93, pulse 78, temperature 97.7 F (36.5 C), resp. rate 20, height '5\' 2"'$  (1.575 m), weight 99.8 kg, SpO2 97 %.  PHYSICAL EXAMINATION:   GENERAL:  61 y.o.-year-old patient lying in the bed with no acute distress.  LUNGS: Normal breath sounds bilaterally, no wheezing, rales, rhonchi.  CARDIOVASCULAR: S1, S2 normal. No murmurs, rubs, or gallops.  ABDOMEN: Soft, nontender, nondistended. Bowel sounds present.  On admission EXTREMITIES:     NEUROLOGIC: nonfocal  patient is alert and awake SKIN: as above  LABORATORY PANEL:  CBC Recent Labs  Lab 05/04/22 0539  WBC 5.1  HGB 9.4*  HCT 30.8*  PLT 275     Chemistries  Recent Labs  Lab 05/03/22 0444 05/04/22 0539  NA 142  --   K 3.9  --   CL 109  --   CO2 27  --   GLUCOSE 108*  --   BUN 15  --   CREATININE 0.68 0.65  CALCIUM 8.5*  --   MG 2.4  --   AST 10*  --   ALT 10  --   ALKPHOS 93  --   BILITOT 0.6  --     Cardiac Enzymes No results for input(s): "TROPONINI" in the last 168 hours. RADIOLOGY:  No results found.  Assessment and Plan Carla [name] is a 61 y.o. female with medical history significant of  PAD s/p right AKA, hypertension, hyperlipidemia, iron deficiency anemia, CAD, GERD who presents to the emergency department via EMS from SNF due to wound at the stump of right AKA, she was seen at the vascular clinic about a week ago and was started on oral antibiotics.  She followed up yesterday and it was noted that the infection was worse and the patient may need surgical intervention.  Right AKA. amputation stump infection with outpatient treatment failure -- patient seen by Dr. Lucky Cowboy --she is s/p irrigation and debridement --  continue broad-spectrum antibiotic for today was discussed with vascular and narrow down--change to po ciprofloxacin (according to recent wound culture from 04/21/22) -- PRN pain meds -- patient will need wound VAC at discharge per Dr. Lucky Cowboy. Dressing will need to be changed twice a week. -- Patient will go home. She will get PT OT and RN at home.  acute hypoxic respiratory failure suspect COPD exacerbation -- patient does not use chronic oxygen -sats were 87% on room air -- no respiratory distress wean to room air as tolerated -- PRN bronchodilators --pt is 94-99% on RA  Hypertension -- BP was soft hold meds  PAD/CAD -- continue Lipitor and aspirin -- patient also in eliquis-- now resumed  iron deficiency anemia -- continue ferrous sulfate      Procedures: Family communication : none Consults : vascular CODE STATUS: full DVT Prophylaxis : heparin Level of care: Med-Surg Status is: Inpatient Remains inpatient appropriate because: amputation stump infection.  Patient will discharged to home tomorrow. Her wound VAC will be delivered today. Daughter is aware. Home health has been set up.    TOTAL TIME TAKING CARE OF THIS PATIENT: 30 minutes.  >50% time spent on counselling and coordination of care  Note: This dictation  was prepared with Dragon dictation along with smaller phrase technology. Any transcriptional errors that result from this process are unintentional.  Fritzi Mandes M.D    [facility]   CC: Primary care physician; Center, [facility]

## 2022-05-05 NOTE — Care Management Important Message (Signed)
Important Message  Patient Details  Name: Carla [name] MRN: [id number] Date of Birth: 10-Jul-1961   Medicare Important Message Given:  Yes     Juliann Pulse A Creg Gilmer 05/05/2022, 2:00 PM

## 2022-05-05 NOTE — Progress Notes (Signed)
[facility]  Daily Progress Note   Subjective  -   Pain control is adequate.  No major events.  Planning for discharge tomorrow  Objective Vitals:   05/04/22 2304 05/05/22 0508 05/05/22 0813 05/05/22 1706  BP: 113/60 (!) 110/57 (!) 106/93 (!) 104/58  Pulse: 76 61 78 70  Resp:  '18 20 18  '$ Temp:  98.1 F (36.7 C) 97.7 F (36.5 C) 98.3 F (36.8 C)  TempSrc:      SpO2: 96% 100% 97% 96%  Weight:      Height:        Intake/Output Summary (Last 24 hours) at 05/05/2022 1712 Last data filed at 05/05/2022 1547 Gross per 24 hour  Intake 360 ml  Output 1800 ml  Net -1440 ml    PULM  CTAB CV  RRR VASC  VAC with good seal right AKA stump site  Laboratory CBC    Component Value Date/Time   WBC 5.1 05/04/2022 0539   HGB 9.4 (L) 05/04/2022 0539   HGB 13.4 02/12/2015 1137   HCT 30.8 (L) 05/04/2022 0539   HCT 40.9 02/12/2015 1137   PLT 275 05/04/2022 0539   PLT 272 02/12/2015 1137    BMET    Component Value Date/Time   NA 142 05/03/2022 0444   NA 140 02/12/2015 1137   K 3.9 05/03/2022 0444   K 2.9 (L) 02/12/2015 1137   CL 109 05/03/2022 0444   CL 107 02/12/2015 1137   CO2 27 05/03/2022 0444   CO2 27 02/12/2015 1137   GLUCOSE 108 (H) 05/03/2022 0444   GLUCOSE 120 (H) 02/12/2015 1137   BUN 15 05/03/2022 0444   BUN 15 02/12/2015 1137   CREATININE 0.65 05/04/2022 0539   CREATININE 0.78 02/12/2015 1137   CALCIUM 8.5 (L) 05/03/2022 0444   CALCIUM 9.0 02/12/2015 1137   GFRNONAA >60 05/04/2022 0539   GFRNONAA >60 02/12/2015 1137   GFRAA >60 11/23/2019 1330   GFRAA >60 02/12/2015 1137    Assessment/Planning: POD #2 s/p right AKA debridement  VAC with good seal Okay to be on oral antibiotics and discharge home with home health doing VAC changes twice weekly We will see in office in 2 weeks for wound check   [name]  05/05/2022, 5:12 PM

## 2022-05-05 NOTE — Plan of Care (Signed)
  Problem: Education: Goal: Knowledge of the prescribed therapeutic regimen will improve Outcome: Progressing Goal: Ability to verbalize activity precautions or restrictions will improve Outcome: Progressing Goal: Understanding of discharge needs will improve Outcome: Progressing   Problem: Activity: Goal: Ability to perform//tolerate increased activity and mobilize with assistive devices will improve Outcome: Progressing   Problem: Clinical Measurements: Goal: Postoperative complications will be avoided or minimized Outcome: Progressing   Problem: Self-Care: Goal: Ability to meet self-care needs will improve Outcome: Progressing   Problem: Self-Concept: Goal: Ability to maintain and perform role responsibilities to the fullest extent possible will improve Outcome: Progressing   Problem: Pain Management: Goal: Pain level will decrease with appropriate interventions Outcome: Progressing   Problem: Skin Integrity: Goal: Demonstration of wound healing without infection will improve Outcome: Progressing   Problem: Education: Goal: Knowledge of General Education information will improve Description: Including pain rating scale, medication(s)/side effects and non-pharmacologic comfort measures Outcome: Progressing   Problem: Health Behavior/Discharge Planning: Goal: Ability to manage health-related needs will improve Outcome: Progressing   Problem: Clinical Measurements: Goal: Ability to maintain clinical measurements within normal limits will improve Outcome: Progressing Goal: Will remain free from infection Outcome: Progressing Goal: Diagnostic test results will improve Outcome: Progressing Goal: Respiratory complications will improve Outcome: Progressing Goal: Cardiovascular complication will be avoided Outcome: Progressing   Problem: Activity: Goal: Risk for activity intolerance will decrease Outcome: Progressing   Problem: Nutrition: Goal: Adequate nutrition  will be maintained Outcome: Progressing   Problem: Coping: Goal: Level of anxiety will decrease Outcome: Progressing   Problem: Elimination: Goal: Will not experience complications related to bowel motility Outcome: Progressing Goal: Will not experience complications related to urinary retention Outcome: Progressing   Problem: Pain Managment: Goal: General experience of comfort will improve Outcome: Progressing   Problem: Safety: Goal: Ability to remain free from injury will improve Outcome: Progressing   Problem: Skin Integrity: Goal: Risk for impaired skin integrity will decrease Outcome: Progressing   

## 2022-05-05 NOTE — Evaluation (Signed)
Physical Therapy Evaluation Patient Details Name: Carla [name] MRN: [id number] DOB: 1961-06-04 Today's Date: 05/05/2022  History of Present Illness  Carla [name] is a 61yo female who returns to [facility] after sign of infection of AKA surgical wound. Pt underwent I&D with Dr. Lucky Cowboy on 7/12.  PMH: HTN, CHF, PAD s/p multiple stents and thrombectomies, SMA thrombosis s/p remote thrumbectomy, CAD, PAF on Eliquis, COPD, NSTEMI (06/27/21), ventral hernia, and recently s/p R AKA on 04/06/22.  Clinical Impression  Pt admitted with above diagnosis. Pt currently with functional limitations due to the deficits listed below (see "PT Problem List"). Upon entry, pt in recliner, awake and agreeable to participate. The pt is alert, pleasant, interactive, but somewhat withdrawn. Pt able to provide some info regarding recent mobility achievements at rehab. Pt able to perform rise to standing with RW first the first time today, minGuard assist and cues for hand placement, but balances well once up in standing. Additional AMB attempt deferred due to high pain levels, analgesics maximized. Pt desires DC back to home at this time, doe snot wish to return to STR, however there is a great deal of mobility training needed in order to prepare pt and family for safe successful transition back to home. Patient's performance this date reveals decreased ability, independence, and tolerance in performing all basic mobility required for performance of activities of daily living. Pt requires additional DME, close physical assistance, and cues for safe participate in mobility. Pt will benefit from skilled PT intervention to increase independence and safety with basic mobility in preparation for discharge to the venue listed below.         Recommendations for follow up therapy are one component of a multi-disciplinary discharge planning process, led by the attending physician.  Recommendations may be updated based on patient status, additional  functional criteria and insurance authorization.  Follow Up Recommendations Skilled nursing-short term rehab (<3 hours/day) (pt wants to DC to home; family will need to be training on stairs navigation with WC)      Assistance Recommended at Discharge    Patient can return home with the following  Two people to help with walking and/or transfers;A lot of help with bathing/dressing/bathroom;Assistance with cooking/housework;Direct supervision/assist for medications management;Direct supervision/assist for financial management;Help with stairs or ramp for entrance;Assist for transportation    Equipment Recommendations Rolling walker (2 wheels) (Pt has a W/C and hospital bed at home.)  Recommendations for Other Services       Functional Status Assessment Patient has had a recent decline in their functional status and demonstrates the ability to make significant improvements in function in a reasonable and predictable amount of time.     Precautions / Restrictions Precautions Precautions: Fall Restrictions Weight Bearing Restrictions: Yes RLE Weight Bearing: Non weight bearing Other Position/Activity Restrictions: R AKA      Mobility  Bed Mobility               General bed mobility comments: already sitting in chair on entry    Transfers Overall transfer level: Needs assistance Equipment used: Rolling walker (2 wheels) Transfers: Sit to/from Stand Sit to Stand: Min guard, Supervision                Ambulation/Gait                  Stairs            Wheelchair Mobility    Modified Rankin (Stroke Patients Only)  Balance                                             Pertinent Vitals/Pain      Home Living Family/patient expects to be discharged to:: Private residence Living Arrangements: Children;Other relatives (DTR and SIL) Available Help at Discharge: Family;Available 24 hours/day Type of Home: Mobile home Home  Access: Stairs to enter Entrance Stairs-Rails: Can reach both Entrance Stairs-Number of Steps: 5   Home Layout: One level Home Equipment: Cane - single point;Rollator (4 wheels) Additional Comments: has a w/c at home    Prior Function Prior Level of Function : Independent/Modified Independent;Needs assist;History of Falls (last six months)             Mobility Comments: Amb with RW, 3-4 falls in previous 6 months ADLs Comments: Independent ADLs, seated baths. Does not participate with cooking/cleaning/housework.     Hand Dominance   Dominant Hand: Right    Extremity/Trunk Assessment   Upper Extremity Assessment Upper Extremity Assessment: Overall WFL for tasks assessed    Lower Extremity Assessment Lower Extremity Assessment: Generalized weakness;RLE deficits/detail RLE Deficits / Details: s/p AKA LLE Sensation: WNL LLE Coordination: WNL       Communication   Communication: No difficulties  Cognition Arousal/Alertness: Awake/alert Behavior During Therapy: Anxious (crying much of sesison) Overall Cognitive Status: Difficult to assess                                          General Comments      Exercises     Assessment/Plan    PT Assessment Patient needs continued PT services  PT Problem List Decreased strength;Decreased range of motion;Decreased activity tolerance;Decreased balance;Decreased mobility;Impaired sensation;Obesity;Decreased skin integrity;Pain       PT Treatment Interventions Gait training;Functional mobility training;Therapeutic activities;Therapeutic exercise;Balance training;Neuromuscular re-education;Patient/family education;Wheelchair mobility training;Modalities;Manual techniques    PT Goals (Current goals can be found in the Care Plan section)  Acute Rehab PT Goals Patient Stated Goal: to walk, improve PT Goal Formulation: With patient Time For Goal Achievement: 04/21/22 Potential to Achieve Goals: Good     Frequency 7X/week     Co-evaluation               AM-PAC PT "6 Clicks" Mobility  Outcome Measure Help needed turning from your back to your side while in a flat bed without using bedrails?: A Little Help needed moving from lying on your back to sitting on the side of a flat bed without using bedrails?: A Little Help needed moving to and from a bed to a chair (including a wheelchair)?: A Little Help needed standing up from a chair using your arms (e.g., wheelchair or bedside chair)?: A Little Help needed to walk in hospital room?: A Lot Help needed climbing 3-5 steps with a railing? : A Lot 6 Click Score: 16    End of Session   Activity Tolerance: Patient limited by pain Patient left: with call bell/phone within reach;in chair   PT Visit Diagnosis: Muscle weakness (generalized) (M62.81);Other abnormalities of gait and mobility (R26.89);Unsteadiness on feet (R26.81)    Time: 2841-3244 PT Time Calculation (min) (ACUTE ONLY): 14 min   Charges:   PT Evaluation $PT Eval Moderate Complexity: 1 Mod        10:58  AM, 05/05/22 Etta Grandchild, PT, DPT Physical Therapist - [facility]  [phone] ([place])    Donnelle Rubey C 05/05/2022, 10:53 AM

## 2022-05-05 NOTE — Evaluation (Signed)
Occupational Therapy Evaluation Patient Details Name: Carla [name] MRN: [id number] DOB: [birth year] Today's Date: 05/05/2022   History of Present Illness Carla [name] is a 61 y.o. female with medical history significant of PAD who s/p right AKA on 04/06/22, hypertension, hyperlipidemia, iron deficiency anemia, CAD, GERD. She presents to ED via EMS from SNF due to wound/infection at the stump of right AKA. On 7/12, she went to OR for irrigation and debridement of skin, soft tissue, and muscle for approximately 45 cm from the right above-knee amputation, with negative pressure dressing placed.   Clinical Impression   Pt seen for OT evaluation this date, POD#2 following I&D of infected stump following R AKA last month. Prior to June hospitalization, pt lived with her daughter and son-in-law, in a single-story home, 5 STE, and was IND in mobility and ADL, occasionally using a RW, reporting 3-4 falls in previous 6 months. She has a hospital bed at home. During today's session, pt endorses 10/10 pain, is reluctant to move, is very tearful, and repeatedly states she does not want to go back to rehab. With encouragement she agrees to movement, and does a good job with bed mobility, making strong use of her UE to roll in bed and to reposition herself to EOB, w/ good sitting balance. She transfers from bed to wheelchair without use of RW, instead using her UE on arms of recliner. She is able to transfer fairly fluidly this way, and states that she has been practicing this movement during her month stay at [facility]. Discussed with pt possibility of using RW to enhance safety with transfers, but she seemed less than convinced at this point. Will trial RW transfers in next OT session. Provided educ re: desensitization strategies for management of residual limb, falls prevention strategies, home/routines modifications, and DME. Would recommend DC to SNF to allow pt to address balance, safety, pain mgmt, fxl mobility  prior to returning home, although today pt was stating she did not want to go back to a SNF and definitely would not return to [facility]. Will continue to follow pt's progress and revise DC recs as needed. Pt will need referral for wheelchair fitting, to procure a chair appropriate for her size and for AKA.   Recommendations for follow up therapy are one component of a multi-disciplinary discharge planning process, led by the attending physician.  Recommendations may be updated based on patient status, additional functional criteria and insurance authorization.   Follow Up Recommendations  Skilled nursing-short term rehab (<3 hours/day)    Assistance Recommended at Discharge Intermittent Supervision/Assistance  Patient can return home with the following A lot of help with walking and/or transfers;A lot of help with bathing/dressing/bathroom;Assistance with cooking/housework;Help with stairs or ramp for entrance    Functional Status Assessment  Patient has had a recent decline in their functional status and demonstrates the ability to make significant improvements in function in a reasonable and predictable amount of time.  Equipment Recommendations  Wheelchair cushion (measurements OT);Wheelchair (measurements OT);Toilet rise with handles    Recommendations for Other Services       Precautions / Restrictions Precautions Precautions: Fall Restrictions Weight Bearing Restrictions: No RLE Weight Bearing: Non weight bearing Other Position/Activity Restrictions: R AKA      Mobility Bed Mobility Overal bed mobility: Needs Assistance Bed Mobility: Supine to Sit Rolling: Supervision   Supine to sit: Supervision     General bed mobility comments: good use of UE for rolling and sitting up in bed  Transfers Overall transfer level: Needs assistance   Transfers: Bed to chair/wheelchair/BSC Sit to Stand: From elevated surface, Min assist   Squat pivot transfers: Min guard               Balance Overall balance assessment: Needs assistance Sitting-balance support: Single extremity supported Sitting balance-[name]: Good Sitting balance - Comments: limited by pain   Standing balance support: During functional activity, Single extremity supported   Standing balance comment: squat pivot transfer for bed to chair w/o AD and or coming into standing balance                           ADL either performed or assessed with clinical judgement   ADL Overall ADL's : Needs assistance/impaired                     Lower Body Dressing: Supervision/safety Lower Body Dressing Details (indicate cue type and reason): donning socks Toilet Transfer: Moderate assistance Toilet Transfer Details (indicate cue type and reason): anticipate Mod A                 Vision         Perception     Praxis      Pertinent Vitals/Pain Pain Assessment Pain Assessment: 0-10 Pain Score: 10-Worst pain ever Pain Location: RLE Pain Descriptors / Indicators: Aching, Grimacing, Guarding, Burning Pain Intervention(s): Repositioned     Hand Dominance Right   Extremity/Trunk Assessment Upper Extremity Assessment Upper Extremity Assessment: Overall WFL for tasks assessed   Lower Extremity Assessment Lower Extremity Assessment: Generalized weakness;RLE deficits/detail RLE Deficits / Details: s/p AKA LLE Sensation: WNL LLE Coordination: WNL       Communication Communication Communication: No difficulties   Cognition Arousal/Alertness: Awake/alert Behavior During Therapy: WFL for tasks assessed/performed, Flat affect Overall Cognitive Status: Within Functional Limits for tasks assessed                                 General Comments: tearful, flat affect, quite down today     General Comments       Exercises Other Exercises Other Exercises: Educ re: falls prevention, DME, desensitization strategies to support residual limb recovery and  pain mgmt   Shoulder Instructions      Home Living Family/patient expects to be discharged to:: Private residence Living Arrangements: Children;Other relatives (DTR and SIL) Available Help at Discharge: Family;Available 24 hours/day Type of Home: Mobile home Home Access: Stairs to enter Entrance Stairs-Number of Steps: 5 Entrance Stairs-Rails: Can reach both Home Layout: One level     Bathroom Shower/Tub: Teacher, early years/pre: Standard Bathroom Accessibility: No   Home Equipment: Cane - single point;Rollator (4 wheels)   Additional Comments: has a w/c at home      Prior Functioning/Environment Prior Level of Function : Independent/Modified Independent;Needs assist;History of Falls (last six months)             Mobility Comments: Amb with RW, 3-4 falls in previous 6 months ADLs Comments: Independent ADLs, seated baths. Does not participate with cooking/cleaning/housework.        OT Problem List: Decreased strength;Pain;Impaired balance (sitting and/or standing);Decreased knowledge of use of DME or AE;Decreased knowledge of precautions      OT Treatment/Interventions: Self-care/ADL training;Therapeutic exercise;Therapeutic activities;DME and/or AE instruction;Patient/family education;Balance training    OT Goals(Current goals can be found in the care  plan section) Acute Rehab OT Goals Patient Stated Goal: to go home rather than SNF OT Goal Formulation: With patient Time For Goal Achievement: 05/19/22 Potential to Achieve Goals: Good ADL Goals Pt Will Transfer to Toilet: with supervision;stand pivot transfer;regular height toilet (using LRAD) Pt Will Perform Toileting - Clothing Manipulation and hygiene: with supervision;sitting/lateral leans Pt Will Perform Tub/Shower Transfer: with min assist;tub bench  OT Frequency: Min 2X/week    Co-evaluation              AM-PAC OT "6 Clicks" Daily Activity     Outcome Measure Help from another person  eating meals?: None Help from another person taking care of personal grooming?: A Little Help from another person toileting, which includes using toliet, bedpan, or urinal?: A Lot Help from another person bathing (including washing, rinsing, drying)?: A Lot Help from another person to put on and taking off regular upper body clothing?: A Little Help from another person to put on and taking off regular lower body clothing?: A Lot 6 Click Score: 16   End of Session    Activity Tolerance: Patient limited by pain;Patient tolerated treatment well Patient left: in chair;with call bell/phone within reach;with nursing/sitter in room  OT Visit Diagnosis: Other abnormalities of gait and mobility (R26.89);Repeated falls (R29.6);Muscle weakness (generalized) (M62.81);Pain Pain - Right/Left: Right Pain - part of body: Leg                Time: 8546-2703 OT Time Calculation (min): 31 min Charges:  OT General Charges $OT Visit: 1 Visit OT Evaluation $OT Eval Moderate Complexity: 1 Mod OT Treatments $Self Care/Home Management : 23-37 mins Josiah Lobo, PhD, MS, OTR/L 05/05/22, 10:24 AM

## 2022-05-05 NOTE — TOC Initial Note (Addendum)
Transition of Care [facility]) - Initial/Assessment Note    Patient Details  Name: Carla [name] MRN: [id number] Date of Birth: 1961/01/13  Transition of Care [facility]) CM/SW Contact:    Candie Chroman, LCSW Phone Number: 05/05/2022, 10:55 AM  Clinical Narrative:  Readmission prevention screen complete. CSW met with patient. No supports at bedside. CSW introduced role and explained that discharge planning would be discussed. Patient has been at [facility] for 20 days prior to admission so therefore she is in her copay days. They would require her to turn over her check minus $30 up front to return. Did not discuss this in assessment because patient has made it clear she wants to go home at discharge. No home health preference. Will begin search to see if anyone can accept. If unable to secure, patient said she thinks her daughter will be able to transport her to office for vac dressing changes 2-3 times a week. Patient lives home with daughter [name]. PCP is [facility]. She cannot remember the providers name. Pharmacy is [facility] on [address]. No issues obtaining medications. She has a hospital bed and rollator at home. No further concerns. CSW encouraged patient to contact CSW as needed. CSW will continue to follow patient for support and facilitate return home when stable.              7/14: Ordered wound vac through [employer].  3:40 pm: Vac will be delivered by courier tonight or tomorrow morning. [facility] can accept for PT, OT, RN and will do vac changes Mondays and Thursdays. Will plan on discharge tomorrow. Daughter will transport. Asked MD to enter DME orders for lightweight wheelchair and RW.  Expected Discharge Plan: [place] Barriers to Discharge: Continued Medical Work up (Finding home health)   Patient Goals and CMS Choice        Expected Discharge Plan and Services Expected Discharge Plan: [place] Acute Care Choice: Durable Medical Equipment,  Home Health Living arrangements for the past 2 months: Single Family Home                                      Prior Living Arrangements/Services Living arrangements for the past 2 months: Single Family Home Lives with:: Adult Children Patient language and need for interpreter reviewed:: Yes Do you feel safe going back to the place where you live?: Yes      Need for Family Participation in Patient Care: Yes (Comment) Care giver support system in place?: Yes (comment) Current home services: DME Criminal Activity/Legal Involvement Pertinent to Current Situation/Hospitalization: No - Comment as needed  Activities of Daily Living Home Assistive Devices/Equipment: Bedside commode/3-in-1 ADL Screening (condition at time of admission) Patient's cognitive ability adequate to safely complete daily activities?: Yes Is the patient deaf or have difficulty hearing?: No Does the patient have difficulty seeing, even when wearing glasses/contacts?: No Does the patient have difficulty concentrating, remembering, or making decisions?: No Patient able to express need for assistance with ADLs?: Yes Does the patient have difficulty dressing or bathing?: No Independently performs ADLs?: Yes (appropriate for developmental age) Does the patient have difficulty walking or climbing stairs?: No Weakness of Legs: Both Weakness of Arms/Hands: None  Permission Sought/Granted Permission sought to share information with : Facility Art therapist granted to share information with : Yes, Verbal Permission Granted  Permission granted to share info w AGENCY: Home Health Agencies        Emotional Assessment Appearance:: Appears stated age Attitude/Demeanor/Rapport: Engaged, Gracious Affect (typically observed): Accepting, Appropriate, Calm, Pleasant Orientation: : Oriented to Self, Oriented to Place, Oriented to  Time, Oriented to Situation Alcohol / Substance Use: Not  Applicable Psych Involvement: No (comment)  Admission diagnosis:  Wound infection [T14.8XXA, L08.9] Patient Active Problem List   Diagnosis Date Noted   Wound infection 05/02/2022   Acute respiratory failure with hypoxia (HCC) 05/02/2022   Lactic acidosis 05/02/2022   Hypoalbuminemia due to protein-calorie malnutrition ([place]) 05/02/2022   Atherosclerosis of artery of extremity with rest pain ([place]) 04/06/2022   Rectal bleeding 03/21/2022   Hypokalemia 02/03/2022   Chronic anticoagulation    Anxiety and depression    Palpitations    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[place]) 12/03/2021   Critical limb ischemia of left lower extremity ([place]) 11/30/2021   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   Arterial occlusion 11/11/2021   CAD (coronary artery disease) 07/07/2021   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[place]) 06/27/2021   Ischemic pain of right foot 06/27/2021   PAD (peripheral artery disease) ([place]) 05/03/2021   Ischemia of extremity 01/24/2021   Tobacco abuse counseling 10/26/2020   Smoker 10/26/2020   Chronic diastolic CHF (congestive heart failure) ([place]) 10/06/2020   Neuropathic pain 08/19/2020   Bursitis of right shoulder 12/01/2019   Chronic pain 12/01/2019   Primary osteoarthritis involving multiple joints 12/01/2019   Ischemia of left lower extremity 11/17/2019   Carpal tunnel syndrome, right 11/10/2019   Health care maintenance 03/26/2019   Disorder of skeletal system 03/26/2019   Problems influencing health status 03/26/2019   Chronic low back pain (Secondary area of Pain) (Bilateral) w/o sciatica 03/26/2019   Rib pain on left side 03/25/2019   Aortic atherosclerosis ([place]) 09/12/2018   COPD (chronic obstructive pulmonary disease) ([place]) 09/12/2018   Moderate episode of recurrent major depressive disorder ([place]) 09/12/2018   Lumbar spondylosis 09/06/2018   History of total knee replacement (Right) 07/11/2018   Ventral hernia with obstruction and without gangrene 07/02/2018   PAF  (paroxysmal atrial fibrillation) ([place]) 06/15/2018   Accelerated hypertension 04/08/2018   Acute encephalopathy 04/08/2018   DDD (degenerative disc disease), lumbosacral 09/25/2017   Osteoarthritis of knee 09/25/2017   Adjustment disorder with anxiety 08/15/2017   Chronic idiopathic constipation    Protein-calorie malnutrition, severe 08/08/2017   SBO (small bowel obstruction) ([place]) 08/06/2017   Chronic mesenteric ischemia ([place])    AKI (acute kidney injury) ([place]) 07/29/2017   Hypotension 07/29/2017   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[place])    Abdominal pain    Occlusive mesenteric ischemia ([place]) 06/19/2017   Acute focal ischemia of small intestine (HCC)    Acute right-sided low back pain with right-sided sciatica 06/13/2017   Osteoarthritis of knee (Bilateral) 06/13/2017   Chronic pain syndrome 09/13/2016   Morbid obesity with BMI of 40.0-44.9, adult ([place]) 07/16/2016   Personal history of other malignant neoplasm of skin 06/01/2016   Chronic knee pain (Primary Area of Pain) (Bilateral) 01/19/2016   Essential hypertension 01/19/2016   GERD (gastroesophageal reflux disease) 01/19/2016   Mixed hyperlipidemia 01/19/2016   Prediabetes 01/19/2016   Recurrent major depressive disorder ([place]) 01/19/2016   Urinary tract infection 01/09/2014   Chronic pelvic pain in female 11/14/2013   Mixed stress and urge urinary incontinence 11/14/2013   Chest pain 08/06/2012   PCP:  [facility] [place]:   [place] ([place]), [place] -  [place] ([place]) [place] [address] Phone: [phone] Fax: [phone]     Social Determinants of Health (SDOH) Interventions    Readmission Risk Interventions    05/05/2022   10:52 AM 03/24/2022    9:51 AM 12/02/2021   11:23 AM  Readmission Risk Prevention Plan  Post Dischage Appt   Complete  Medication Screening   Complete  Transportation Screening Complete Complete  Complete  PCP or Specialist Appt within 3-5 Days  Complete   HRI or [place]  Complete   Social Work Consult for [place] Planning/Counseling  Complete   Palliative Care Screening  Not Applicable   Medication Review Press photographer) Complete Complete   PCP or Specialist appointment within 3-5 days of discharge Complete    SW Recovery Care/Counseling Consult Complete    Palliative Care Screening Not [place] Patient Refused

## 2022-05-05 NOTE — TOC CM/SW Note (Signed)
    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  A cane, crutch, or walker will not resolve  issue with performing activities of daily living. A wheelchair will allow patient to safely perform daily activities. Patient is not able to propel themselves in the home using a standard weight wheelchair due to endurance and general weakness. Patient can self propel in the lightweight wheelchair. Length of need Lifetime. Accessories: elevating leg rests (ELRs), wheel locks, extensions and anti-tippers.   05/05/22 1542   05/05/22 1542  For home use only DME Walker rolling  Once       Question Answer Comment  Walker: With [place]   Patient needs a walker to treat with the following condition General weakness      05/05/22 1542   05/03/22 1731  For home use only DME Negative pressure wound device  Once       Question Answer Comment  Frequency of dressing change 2 times per week   Length of need 3 Months   Dressing type Foam   Amount of suction 120 mm/Hg   Pressure application Continuous pressure   Supplies 10 canisters and 15 dressings per month for duration of therapy      05/03/22 1730

## 2022-05-06 ENCOUNTER — Encounter: Payer: Self-pay | Admitting: *Deleted

## 2022-05-06 DIAGNOSIS — L089 Local infection of the skin and subcutaneous tissue, unspecified: Secondary | ICD-10-CM | POA: Diagnosis not present

## 2022-05-06 DIAGNOSIS — T148XXA Other injury of unspecified body region, initial encounter: Secondary | ICD-10-CM | POA: Diagnosis not present

## 2022-05-06 MED ORDER — ENSURE ENLIVE PO LIQD: 237.0000 mL | Freq: Two times a day (BID) | ORAL | 12 refills | Status: AC

## 2022-05-06 MED ORDER — FUROSEMIDE 40 MG PO TABS
20.0000 mg | ORAL_TABLET | Freq: Every day | ORAL | 0 refills | Status: DC | PRN
Start: 2022-05-06 — End: 2022-07-22

## 2022-05-06 MED ORDER — CIPROFLOXACIN HCL 500 MG PO TABS: 500.0000 mg | ORAL_TABLET | Freq: Two times a day (BID) | ORAL | 0 refills | Status: AC

## 2022-05-06 MED ORDER — OXYCODONE-ACETAMINOPHEN 5-325 MG PO TABS
1.0000 | ORAL_TABLET | Freq: Four times a day (QID) | ORAL | 0 refills | Status: DC | PRN
Start: 2022-05-06 — End: 2022-05-08

## 2022-05-06 NOTE — Discharge Summary (Signed)
Physician Discharge Summary   Patient: Carla [name] MRN: [id number] DOB: [birth year]  Admit date:     05/02/2022  Discharge date: 05/06/22  Discharge Physician: Fritzi Mandes   PCP: [facility], [facility]   Recommendations at discharge:    F/u [facility] Vascular surgery in 1-2 weeks F/u PCP in 1-2 weeks  Discharge Diagnoses: Principal Problem:   Wound infection Active Problems:   CAD (coronary artery disease)   PAD (peripheral artery disease) ([place])   Morbid obesity with BMI of 40.0-44.9, adult ([place])   Essential hypertension   GERD (gastroesophageal reflux disease)   Mixed hyperlipidemia   Acute respiratory failure with hypoxia ([place])   Lactic acidosis   Hypoalbuminemia due to protein-calorie malnutrition [facility])  Hospital Course:  Carla [name] is a 61 y.o. female with medical history significant of  PAD s/p right AKA, hypertension, hyperlipidemia, iron deficiency anemia, CAD, GERD who presents to the emergency department via EMS from SNF due to wound at the stump of right AKA, she was seen at the vascular clinic about a week ago and was started on oral antibiotics.  She followed up yesterday and it was noted that the infection was worse and the patient may need surgical intervention.   Right AKA. amputation stump infection with outpatient treatment failure -- patient seen by Dr. Lucky Cowboy --she is s/p irrigation and debridement -- continue broad-spectrum antibiotic for today was discussed with vascular and narrow down--change to po ciprofloxacin (according to recent wound culture from 04/21/22) -- PRN pain meds -- patient will need wound VAC at discharge per Dr. Lucky Cowboy. Dressing will need to be changed twice a week (Monday and Thursday) -- Patient will go home. She will get PT OT and RN at home.   Acute hypoxic respiratory failure suspect COPD exacerbation--resolved -- patient does not use chronic oxygen -sats were 87% on room air -- no respiratory distress wean to room air as  tolerated -- PRN bronchodilators --pt is 94-99% on RA   Hypertension -- BP was soft hold meds --I have held amlodipine and metoprolol at discharge. Pt to keep log of BP at home and review with PCP   PAD/CAD -- continue Lipitor and aspirin -- patient also in eliquis-- now resumed   iron deficiency anemia -- continue ferrous sulfate    Overall at baseline. D/c home with HHPT         Procedures: debridement of Right Amputation stump Family communication : dter [name] Consults : vascular CODE STATUS: full DVT Prophylaxis : eliquis       Disposition: Home health Diet recommendation:  Discharge Diet Orders (From admission, onward)     Start     Ordered   05/06/22 0000  Diet - low sodium heart healthy        05/06/22 0817           Cardiac and Carb modified diet DISCHARGE MEDICATION: Allergies as of 05/06/2022       Reactions   Gabapentin    Tremors    Bactrim [sulfamethoxazole-trimethoprim] Itching        Medication List     STOP taking these medications    amLODipine 5 MG tablet Commonly known as: NORVASC   metoprolol tartrate 50 MG tablet Commonly known as: LOPRESSOR   morphine 15 MG 12 hr tablet Commonly known as: MS CONTIN   traMADol 50 MG tablet Commonly known as: ULTRAM       TAKE these medications    apixaban 5 MG Tabs tablet  Commonly known as: ELIQUIS Take 1 tablet (5 mg total) by mouth 2 (two) times daily.   aspirin EC 81 MG tablet Take 81 mg by mouth daily. Swallow whole.   atorvastatin 80 MG tablet Commonly known as: LIPITOR Take 80 mg by mouth at bedtime.   ciprofloxacin 500 MG tablet Commonly known as: CIPRO Take 1 tablet (500 mg total) by mouth 2 (two) times daily for 4 days.   citalopram 40 MG tablet Commonly known as: CeleXA Take 1 tablet (40 mg total) by mouth daily.   Combivent Respimat 20-100 MCG/ACT Aers respimat Generic drug: Ipratropium-Albuterol   feeding supplement Liqd Take 237 mLs by mouth 2 (two)  times daily between meals.   ferrous sulfate 325 (65 FE) MG tablet Take 325 mg by mouth daily.   furosemide 40 MG tablet Commonly known as: LASIX Take 0.5 tablets (20 mg total) by mouth daily as needed. For leg swelling What changed:  how much to take when to take this reasons to take this additional instructions   isosorbide mononitrate 60 MG 24 hr tablet Commonly known as: IMDUR Take 60 mg by mouth daily.   methocarbamol 500 MG tablet Commonly known as: ROBAXIN Take 1 tablet (500 mg total) by mouth every 8 (eight) hours as needed for muscle spasms.   multivitamin with minerals Tabs tablet Take 1 tablet by mouth daily.   nitroGLYCERIN 0.4 MG SL tablet Commonly known as: NITROSTAT Place 0.4 mg under the tongue as directed.   omeprazole 20 MG capsule Commonly known as: PRILOSEC Take 20 mg by mouth 2 (two) times daily before a meal.   ondansetron 4 MG tablet Commonly known as: ZOFRAN Take 4 mg by mouth every 8 (eight) hours as needed for nausea or vomiting.   oxyCODONE-acetaminophen 5-325 MG tablet Commonly known as: PERCOCET/ROXICET Take 1 tablet by mouth every 6 (six) hours as needed for moderate pain. What changed: how much to take   pregabalin 100 MG capsule Commonly known as: Lyrica Take 1 capsule (100 mg total) by mouth 3 (three) times daily.   Pro-Stat Liqd Take 30 mLs by mouth in the morning and at bedtime.               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  A cane, crutch, or walker will not resolve  issue with performing activities of daily living. A wheelchair will allow patient to safely perform daily activities. Patient is not able to  propel themselves in the home using a standard weight wheelchair due to endurance and general weakness. Patient can self propel in the lightweight wheelchair. Length of need Lifetime. Accessories: elevating leg rests (ELRs), wheel locks, extensions and anti-tippers.   05/05/22 1542   05/05/22 1542  For home use only DME Walker rolling  Once       Question Answer Comment  Walker: With [place]   Patient needs a walker to treat with the following condition General weakness      05/05/22 1542   05/03/22 1731  For home use only DME Negative pressure wound device  Once  Question Answer Comment  Frequency of dressing change 2 times per week   Length of need 3 Months   Dressing type Foam   Amount of suction 120 mm/Hg   Pressure application Continuous pressure   Supplies 10 canisters and 15 dressings per month for duration of therapy      05/03/22 1730              Discharge Care Instructions  (From admission, onward)           Start     Ordered   05/06/22 0000  Discharge wound care:       Comments: 05/03/22 1731    Negative pressure wound therapy  Do not change dressing      Question Answer Comment Amount of suction? 125 mm/Hg   Suction Type? Continuous     05/03/22 1730  Change on every Monday and Thursday   05/06/22 0817            Follow-up Information     [facility] Follow up.   Why: They will follow up with you for your home health needs: Physical therapy, occupational therapy, and nursing/wound care.        Kris Hartmann, NP Follow up in 2 week(s).   Specialty: Vascular Surgery Why: For wound re-check Contact information: [address] [place] [address] [phone]         [facility], [facility]. Schedule an appointment as soon as possible for a visit in 1 week(s).   Specialty: General Practice Why: hospital f/u Contact information: [place] [address] [phone]                 Discharge Exam: Danley Danker Weights   05/02/22 1629  Weight: 99.8 kg     Condition at discharge: fair  The results of significant diagnostics from this hospitalization (including imaging, microbiology, ancillary and laboratory) are listed below for reference.   Imaging Studies: DG Chest Port 1 View  Result Date: 05/02/2022 CLINICAL DATA:  Suspected sepsis EXAM: PORTABLE CHEST 1 VIEW COMPARISON:  02/03/2022 FINDINGS: The heart size and mediastinal contours are within normal limits. Both lungs are clear. The visualized skeletal structures are unremarkable. IMPRESSION: No active disease. Electronically Signed   By: Davina Poke D.O.   On: 05/02/2022 12:26    Microbiology: Results for orders placed or performed during the hospital encounter of 05/02/22  Culture, blood (Routine x 2)     Status: None (Preliminary result)   Collection Time: 05/02/22 11:56 AM   Specimen: BLOOD  Result Value Ref Range Status   Specimen Description BLOOD RIGTH HAND  Final   Special Requests   Final    BOTTLES DRAWN AEROBIC AND ANAEROBIC Blood Culture results may not be optimal due to an excessive volume of blood received in culture bottles   Culture   Final    NO GROWTH 4 DAYS Performed at [facility], [address] [place], [place] [address]    Report Status PENDING  Incomplete  Culture, blood (Routine x 2)     Status: None (Preliminary result)   Collection Time: 05/02/22  2:14 PM   Specimen: BLOOD  Result Value Ref Range Status   Specimen Description BLOOD LEFT ANTECUBITAL  Final   Special Requests   Final    BOTTLES DRAWN AEROBIC AND ANAEROBIC Blood Culture results may not be optimal due to an inadequate volume of blood received in culture bottles   Culture  Final    NO GROWTH 4 DAYS Performed at [facility], [address] [place], [place] [address]    Report Status PENDING  Incomplete  SARS Coronavirus 2 by RT PCR (hospital order, performed in [facility] hospital lab)  *cepheid single result test* Anterior Nasal Swab     Status: None   Collection Time: 05/02/22  2:14 PM   Specimen: Anterior Nasal Swab  Result Value Ref Range Status   SARS Coronavirus 2 by RT PCR NEGATIVE NEGATIVE Final    Comment: (NOTE) SARS-CoV-2 target nucleic acids are NOT DETECTED.  The SARS-CoV-2 RNA is generally detectable in upper and lower respiratory specimens during the acute phase of infection. The lowest concentration of SARS-CoV-2 viral copies this assay can detect is 250 copies / mL. A negative result does not preclude SARS-CoV-2 infection and should not be used as the sole basis for treatment or other patient management decisions.  A negative result may occur with improper specimen collection / handling, submission of specimen other than nasopharyngeal swab, presence of viral mutation(s) within the areas targeted by this assay, and inadequate number of viral copies (<250 copies / mL). A negative result must be combined with clinical observations, patient history, and epidemiological information.  Fact Sheet for Patients:   https://www.Jonmichael Beadnell.info/  Fact Sheet for Healthcare Providers: https://hall.com/  This test is not yet approved or  cleared by the [place] FDA and has been authorized for detection and/or diagnosis of SARS-CoV-2 by FDA under an Emergency Use Authorization (EUA).  This EUA will remain in effect (meaning this test can be used) for the duration of the COVID-19 declaration under Section 564(b)(1) of the Act, 21 U.S.C. section 360bbb-3(b)(1), unless the authorization is terminated or revoked sooner.  Performed at [facility], [address] [place], [place] [address]   Surgical pcr screen     Status: Abnormal   Collection Time: 05/02/22  4:34 PM   Specimen: Nasal Mucosa; Nasal Swab  Result Value Ref Range Status   MRSA, PCR POSITIVE (A) NEGATIVE Final    Comment: RESULT CALLED TO, READ  BACK BY AND VERIFIED WITH: ANDRILL HESTER '@[id number]'$  ON 05/02/22 SKL    Staphylococcus aureus POSITIVE (A) NEGATIVE Final    Comment: (NOTE) The Xpert SA Assay (FDA approved for NASAL specimens in patients 44 years of age and older), is one component of a comprehensive surveillance program. It is not intended to diagnose infection nor to guide or monitor treatment. Performed at [facility], [address] [place], [place] [address]     Labs: CBC: Recent Labs  Lab 05/02/22 1158 05/03/22 0444 05/04/22 0539  WBC 7.4 6.4 5.1  NEUTROABS 4.7  --   --   HGB 9.4* 9.5* 9.4*  HCT 31.2* 31.7* 30.8*  MCV 88.1 88.1 87.3  PLT 282 267 287   Basic Metabolic Panel: Recent Labs  Lab 05/02/22 1158 05/03/22 0444 05/04/22 0539  NA 140 142  --   K 4.0 3.9  --   CL 106 109  --   CO2 26 27  --   GLUCOSE 103* 108*  --   BUN 12 15  --   CREATININE 0.84 0.68 0.65  CALCIUM 8.7* 8.5*  --   MG  --  2.4  --   PHOS  --  4.4  --    Liver Function Tests: Recent Labs  Lab 05/02/22 1158 05/03/22 0444  AST 15 10*  ALT 10 10  ALKPHOS 105 93  BILITOT  0.4 0.6  PROT 6.4* 6.0*  ALBUMIN 2.8* 2.7*   CBG: Recent Labs  Lab 05/02/22 2119  GLUCAP 140*    Discharge time spent: greater than 30 minutes.  Signed: Fritzi Mandes, MD [facility] 05/06/2022

## 2022-05-06 NOTE — Progress Notes (Signed)
Discharge instructions given to patient on new meds, and changed medication.   Wound vac changed to the mobile unit for home use. Equipment working well.   Patient and family verbalized understanding on having to make any follow up appointments due to it being the weekend.

## 2022-05-07 LAB — CULTURE, BLOOD (ROUTINE X 2)
Culture: NO GROWTH
Culture: NO GROWTH

## 2022-05-08 ENCOUNTER — Other Ambulatory Visit (INDEPENDENT_AMBULATORY_CARE_PROVIDER_SITE_OTHER): Payer: Self-pay | Admitting: Nurse Practitioner

## 2022-05-08 ENCOUNTER — Telehealth (INDEPENDENT_AMBULATORY_CARE_PROVIDER_SITE_OTHER): Payer: Self-pay

## 2022-05-08 MED ORDER — OXYCODONE-ACETAMINOPHEN 5-325 MG PO TABS: 1.0000 | ORAL_TABLET | Freq: Four times a day (QID) | ORAL | 0 refills | Status: DC | PRN

## 2022-05-08 MED ORDER — PREGABALIN 100 MG PO CAPS
100.0000 mg | ORAL_CAPSULE | Freq: Three times a day (TID) | ORAL | 3 refills | Status: DC
Start: 2022-05-08 — End: 2022-08-12

## 2022-05-08 NOTE — Telephone Encounter (Signed)
Patient daughter Carla [name]) made aware prescription has been sent

## 2022-05-08 NOTE — Telephone Encounter (Signed)
sent 

## 2022-05-08 NOTE — Telephone Encounter (Signed)
Sarah from [facility] reach out to informed that they will not be able to do home health care due location.Judson Roch has reach out to [facility] and all of them will not be able to start care due to patient insurance or location. Sarah informed that [facility] home [facility] will be able to do wound care but not PT or PT if insurance is approved.

## 2022-05-09 ENCOUNTER — Telehealth (INDEPENDENT_AMBULATORY_CARE_PROVIDER_SITE_OTHER): Payer: Self-pay | Admitting: Nurse Practitioner

## 2022-05-09 NOTE — Telephone Encounter (Signed)
Sarah from [facility] called and stated that she could not find anyone to go out to change patient wound vac.  She stated either there was no nurse in [facility] or they wasn't taking her insurance.  She  suggested that patient go back to Rehab, but patient did not want to go back.  Please advise

## 2022-05-09 NOTE — Telephone Encounter (Signed)
Patient has been schedule to come in the office for wound vac change

## 2022-05-10 ENCOUNTER — Encounter (INDEPENDENT_AMBULATORY_CARE_PROVIDER_SITE_OTHER): Payer: Self-pay | Admitting: Nurse Practitioner

## 2022-05-10 ENCOUNTER — Ambulatory Visit (INDEPENDENT_AMBULATORY_CARE_PROVIDER_SITE_OTHER): Payer: Medicare Other | Admitting: Nurse Practitioner

## 2022-05-10 VITALS — BP 132/73 | HR 82 | Resp 16

## 2022-05-10 DIAGNOSIS — Z89611 Acquired absence of right leg above knee: Secondary | ICD-10-CM

## 2022-05-10 NOTE — Progress Notes (Signed)
Patient came in for right above knee wound vac change. Patient will return starting with twice a week visits until wound heals. Patient is schedule to return on Monday for next wound vac change.

## 2022-05-15 ENCOUNTER — Ambulatory Visit (INDEPENDENT_AMBULATORY_CARE_PROVIDER_SITE_OTHER): Payer: Medicare Other | Admitting: Nurse Practitioner

## 2022-05-15 VITALS — BP 104/69 | HR 70 | Resp 16

## 2022-05-15 DIAGNOSIS — Z89611 Acquired absence of right leg above knee: Secondary | ICD-10-CM

## 2022-05-15 NOTE — Progress Notes (Unsigned)
Patient came in for right aka wound vac change. Patient wound was clean and wound vac was placed. Patient will return on Thursday for next wound vac change.

## 2022-05-16 ENCOUNTER — Encounter (INDEPENDENT_AMBULATORY_CARE_PROVIDER_SITE_OTHER): Payer: Self-pay | Admitting: Nurse Practitioner

## 2022-05-18 ENCOUNTER — Ambulatory Visit (INDEPENDENT_AMBULATORY_CARE_PROVIDER_SITE_OTHER): Payer: Medicare Other | Admitting: Nurse Practitioner

## 2022-05-18 ENCOUNTER — Encounter (INDEPENDENT_AMBULATORY_CARE_PROVIDER_SITE_OTHER): Payer: Self-pay | Admitting: Nurse Practitioner

## 2022-05-18 VITALS — BP 100/68 | HR 73 | Resp 16

## 2022-05-18 DIAGNOSIS — Z89611 Acquired absence of right leg above knee: Secondary | ICD-10-CM | POA: Diagnosis not present

## 2022-05-18 MED ORDER — OXYCODONE-ACETAMINOPHEN 5-325 MG PO TABS: 1.0000 | ORAL_TABLET | Freq: Four times a day (QID) | ORAL | 0 refills | Status: DC | PRN

## 2022-05-18 NOTE — Progress Notes (Signed)
Patient came in for wound vac change for the right aka. Patient wound is healing and has decrease in size. I applied wound vac and patient is schedule to return on Monday.

## 2022-05-19 ENCOUNTER — Telehealth (INDEPENDENT_AMBULATORY_CARE_PROVIDER_SITE_OTHER): Payer: Self-pay

## 2022-05-19 NOTE — Telephone Encounter (Signed)
Patient called the office stating that her wound vac machine continues to go off. Patient was recommended to come in the office today but due to transportation she would be able to come. I spoke with patient daughter she informed that she will check the tubing to see if there is clogged. I recommended the patient daughter to change the tubing and see if the machine starts working. I spoke with Arna Medici NP and she advise for patient to be evaluated at the ED if wound vac does not operate correctly. Patient daughter was made aware with all medical advice and verbalized understanding.

## 2022-05-22 ENCOUNTER — Ambulatory Visit (INDEPENDENT_AMBULATORY_CARE_PROVIDER_SITE_OTHER): Payer: Medicare Other | Admitting: Nurse Practitioner

## 2022-05-22 ENCOUNTER — Encounter (INDEPENDENT_AMBULATORY_CARE_PROVIDER_SITE_OTHER): Payer: Self-pay | Admitting: Nurse Practitioner

## 2022-05-22 VITALS — BP 111/65 | HR 70 | Resp 16

## 2022-05-22 DIAGNOSIS — Z89611 Acquired absence of right leg above knee: Secondary | ICD-10-CM | POA: Diagnosis not present

## 2022-05-22 NOTE — Progress Notes (Signed)
Patient came in for right AKA wound vac change. Patient wound clean and is decreasing in size. The patient will return on Thursday for her next wound vac change

## 2022-05-25 ENCOUNTER — Ambulatory Visit (INDEPENDENT_AMBULATORY_CARE_PROVIDER_SITE_OTHER): Payer: Medicare Other | Admitting: Nurse Practitioner

## 2022-05-25 ENCOUNTER — Encounter (INDEPENDENT_AMBULATORY_CARE_PROVIDER_SITE_OTHER): Payer: Self-pay

## 2022-05-25 VITALS — BP 98/63 | HR 68 | Resp 16

## 2022-05-25 DIAGNOSIS — Z89611 Acquired absence of right leg above knee: Secondary | ICD-10-CM

## 2022-05-25 MED ORDER — OXYCODONE-ACETAMINOPHEN 5-325 MG PO TABS: 1.0000 | ORAL_TABLET | Freq: Four times a day (QID) | ORAL | 0 refills | Status: DC | PRN

## 2022-05-25 NOTE — Progress Notes (Signed)
Patient came in for right aka wound vac change. Patient wound has decreased in size and healing. Aquacel was placed over the wound to protect the skin with wound vac. Patient will return on Monday for next wound check.

## 2022-05-26 ENCOUNTER — Encounter (INDEPENDENT_AMBULATORY_CARE_PROVIDER_SITE_OTHER): Payer: Self-pay

## 2022-05-26 ENCOUNTER — Ambulatory Visit (INDEPENDENT_AMBULATORY_CARE_PROVIDER_SITE_OTHER): Payer: Medicare Other | Admitting: Nurse Practitioner

## 2022-05-26 VITALS — BP 86/57 | HR 67 | Resp 16

## 2022-05-26 DIAGNOSIS — Z89611 Acquired absence of right leg above knee: Secondary | ICD-10-CM

## 2022-05-26 NOTE — Progress Notes (Signed)
Patient came in due the wound vac machine reading a error. I placed aquacel and new sponge on wound. Patient wound has a great sealed and she will return on Monday for follow up visit.

## 2022-05-26 NOTE — Addendum Note (Signed)
Addended by: Kris Hartmann on: 05/26/2022 09:20 AM   Modules accepted: Level of Service

## 2022-05-29 ENCOUNTER — Ambulatory Visit (INDEPENDENT_AMBULATORY_CARE_PROVIDER_SITE_OTHER): Payer: Medicare Other | Admitting: Nurse Practitioner

## 2022-05-29 ENCOUNTER — Encounter (INDEPENDENT_AMBULATORY_CARE_PROVIDER_SITE_OTHER): Payer: Self-pay | Admitting: Nurse Practitioner

## 2022-05-29 VITALS — BP 102/68 | HR 69 | Resp 16

## 2022-05-29 DIAGNOSIS — Z89611 Acquired absence of right leg above knee: Secondary | ICD-10-CM

## 2022-05-29 NOTE — Progress Notes (Unsigned)
Patient wound dressing was change and wound vac was applied with aquacel. Patient wound has decreased in size and she will return on Thursday for next wound change.

## 2022-05-30 ENCOUNTER — Encounter (INDEPENDENT_AMBULATORY_CARE_PROVIDER_SITE_OTHER): Payer: Self-pay | Admitting: Nurse Practitioner

## 2022-06-01 ENCOUNTER — Encounter (INDEPENDENT_AMBULATORY_CARE_PROVIDER_SITE_OTHER): Payer: Medicare Other

## 2022-06-01 ENCOUNTER — Ambulatory Visit (INDEPENDENT_AMBULATORY_CARE_PROVIDER_SITE_OTHER): Payer: Medicare Other | Admitting: Nurse Practitioner

## 2022-06-01 VITALS — BP 156/72 | HR 75 | Resp 16

## 2022-06-01 DIAGNOSIS — Z89611 Acquired absence of right leg above knee: Secondary | ICD-10-CM

## 2022-06-01 NOTE — Progress Notes (Signed)
Patient came in for wound vac change right above knee amputation. Patient wound has decreased in size and wound vac has been discontinue. The patient will continue coming twice a week applying aquacel and 4x4 over wound.

## 2022-06-02 ENCOUNTER — Encounter (INDEPENDENT_AMBULATORY_CARE_PROVIDER_SITE_OTHER): Payer: Self-pay | Admitting: Nurse Practitioner

## 2022-06-02 ENCOUNTER — Telehealth (INDEPENDENT_AMBULATORY_CARE_PROVIDER_SITE_OTHER): Payer: Self-pay

## 2022-06-02 NOTE — Telephone Encounter (Signed)
Her last refill was sent on 05/25/2022 and picked up on 05/27/2022. The pills I sent her should last 10 days if she is taking them as prescribed.   Unfortunately there are some pretty strict rules regarding refills and sending refills too soon can result in flags being placed and her not being able to get them at all.  So I wont be able to send in a refill just yet

## 2022-06-02 NOTE — Telephone Encounter (Signed)
Left detailed VM on pt's phone regarding her pain medication.

## 2022-06-02 NOTE — Telephone Encounter (Signed)
Spoke with pt and let her know we could not refill pain meds at this time.  She verbalized understanding.

## 2022-06-05 ENCOUNTER — Other Ambulatory Visit (INDEPENDENT_AMBULATORY_CARE_PROVIDER_SITE_OTHER): Payer: Self-pay | Admitting: Nurse Practitioner

## 2022-06-05 ENCOUNTER — Encounter (INDEPENDENT_AMBULATORY_CARE_PROVIDER_SITE_OTHER): Payer: Self-pay | Admitting: Nurse Practitioner

## 2022-06-05 ENCOUNTER — Ambulatory Visit (INDEPENDENT_AMBULATORY_CARE_PROVIDER_SITE_OTHER): Payer: Medicare Other | Admitting: Nurse Practitioner

## 2022-06-05 VITALS — BP 134/65 | HR 77 | Resp 16

## 2022-06-05 DIAGNOSIS — Z89611 Acquired absence of right leg above knee: Secondary | ICD-10-CM

## 2022-06-05 MED ORDER — OXYCODONE-ACETAMINOPHEN 5-325 MG PO TABS: 1.0000 | ORAL_TABLET | Freq: Four times a day (QID) | ORAL | 0 refills | Status: DC | PRN

## 2022-06-05 NOTE — Progress Notes (Signed)
Patient came in for right aka wound dressing change. Patient wound was clean with saline and I applied aquacel with 4x4. Patient is schedule to return on 06/08/22 for next wound check

## 2022-06-06 ENCOUNTER — Ambulatory Visit: Admission: RE | Admit: 2022-06-06 | Payer: Medicare Other | Source: Ambulatory Visit

## 2022-06-08 ENCOUNTER — Ambulatory Visit (INDEPENDENT_AMBULATORY_CARE_PROVIDER_SITE_OTHER): Payer: Medicare Other | Admitting: Nurse Practitioner

## 2022-06-08 ENCOUNTER — Encounter (INDEPENDENT_AMBULATORY_CARE_PROVIDER_SITE_OTHER): Payer: Self-pay

## 2022-06-08 VITALS — BP 99/66 | HR 66 | Resp 15

## 2022-06-08 DIAGNOSIS — Z89611 Acquired absence of right leg above knee: Secondary | ICD-10-CM | POA: Diagnosis not present

## 2022-06-08 NOTE — Progress Notes (Signed)
Patient came in for right aka wound dressing change. The wound was clean with saline. I applied Aquacel and 4x4. The patient wound has decreased in size.  Patient is schedule for next wound check on Monday

## 2022-06-11 ENCOUNTER — Encounter (INDEPENDENT_AMBULATORY_CARE_PROVIDER_SITE_OTHER): Payer: Self-pay | Admitting: Nurse Practitioner

## 2022-06-12 ENCOUNTER — Ambulatory Visit
Admission: RE | Admit: 2022-06-12 | Discharge: 2022-06-12 | Disposition: A | Discharge status: Home / Self Care | Payer: Medicare Other | Source: Ambulatory Visit | Attending: Student | Admitting: Student

## 2022-06-12 ENCOUNTER — Ambulatory Visit (INDEPENDENT_AMBULATORY_CARE_PROVIDER_SITE_OTHER): Payer: Medicare Other | Admitting: Nurse Practitioner

## 2022-06-12 ENCOUNTER — Encounter (INDEPENDENT_AMBULATORY_CARE_PROVIDER_SITE_OTHER): Payer: Self-pay | Admitting: Nurse Practitioner

## 2022-06-12 VITALS — BP 107/77 | HR 88 | Resp 16

## 2022-06-12 DIAGNOSIS — M7581 Other shoulder lesions, right shoulder: Secondary | ICD-10-CM | POA: Insufficient documentation

## 2022-06-12 DIAGNOSIS — S46011A Strain of muscle(s) and tendon(s) of the rotator cuff of right shoulder, initial encounter: Secondary | ICD-10-CM | POA: Insufficient documentation

## 2022-06-12 DIAGNOSIS — M7521 Bicipital tendinitis, right shoulder: Secondary | ICD-10-CM | POA: Insufficient documentation

## 2022-06-12 DIAGNOSIS — Z89611 Acquired absence of right leg above knee: Secondary | ICD-10-CM

## 2022-06-12 NOTE — Progress Notes (Signed)
Patient came in for right aka wound dressing change. Patient wound is healing and decreasing in size. Patient wound was dressed with aquacel with 4x4. Patient will return in the office on Thursday

## 2022-06-15 ENCOUNTER — Ambulatory Visit (INDEPENDENT_AMBULATORY_CARE_PROVIDER_SITE_OTHER): Payer: Medicare Other | Admitting: Nurse Practitioner

## 2022-06-15 ENCOUNTER — Encounter (INDEPENDENT_AMBULATORY_CARE_PROVIDER_SITE_OTHER): Payer: Self-pay | Admitting: Nurse Practitioner

## 2022-06-15 VITALS — BP 135/86 | HR 75 | Resp 16

## 2022-06-15 DIAGNOSIS — Z89611 Acquired absence of right leg above knee: Secondary | ICD-10-CM

## 2022-06-15 MED ORDER — OXYCODONE-ACETAMINOPHEN 5-325 MG PO TABS: 1.0000 | ORAL_TABLET | Freq: Four times a day (QID) | ORAL | 0 refills | Status: DC | PRN

## 2022-06-18 ENCOUNTER — Encounter (INDEPENDENT_AMBULATORY_CARE_PROVIDER_SITE_OTHER): Payer: Self-pay | Admitting: Nurse Practitioner

## 2022-06-19 ENCOUNTER — Encounter (INDEPENDENT_AMBULATORY_CARE_PROVIDER_SITE_OTHER): Payer: Self-pay | Admitting: Nurse Practitioner

## 2022-06-19 ENCOUNTER — Ambulatory Visit (INDEPENDENT_AMBULATORY_CARE_PROVIDER_SITE_OTHER): Payer: Medicare Other | Admitting: Surgery

## 2022-06-19 ENCOUNTER — Other Ambulatory Visit: Payer: Self-pay

## 2022-06-19 ENCOUNTER — Encounter (INDEPENDENT_AMBULATORY_CARE_PROVIDER_SITE_OTHER): Payer: Self-pay

## 2022-06-19 ENCOUNTER — Ambulatory Visit (INDEPENDENT_AMBULATORY_CARE_PROVIDER_SITE_OTHER): Payer: Medicare Other | Admitting: Nurse Practitioner

## 2022-06-19 ENCOUNTER — Encounter: Payer: Self-pay | Admitting: Surgery

## 2022-06-19 VITALS — BP 90/57 | HR 71 | Resp 16

## 2022-06-19 VITALS — BP 137/77 | HR 80 | Temp 98.0°F | Ht 62.0 in | Wt 178.0 lb

## 2022-06-19 DIAGNOSIS — K430 Incisional hernia with obstruction, without gangrene: Secondary | ICD-10-CM | POA: Diagnosis not present

## 2022-06-19 DIAGNOSIS — Z89611 Acquired absence of right leg above knee: Secondary | ICD-10-CM

## 2022-06-19 DIAGNOSIS — K439 Ventral hernia without obstruction or gangrene: Secondary | ICD-10-CM

## 2022-06-19 NOTE — Progress Notes (Signed)
Patient came in for right above knee wound dressing change. The wound has decrease in size and is healing well. The wound was cleaned with saline before applying Aquacel and 4x4. Patient is schedule to return on Thursday for next wound check.

## 2022-06-19 NOTE — Patient Instructions (Signed)
We will contact you in 6 months. Please call the office with any questions or concerns.

## 2022-06-20 ENCOUNTER — Encounter: Payer: Self-pay | Admitting: Surgery

## 2022-06-20 NOTE — Progress Notes (Signed)
Outpatient Surgical Follow Up  06/20/2022  Carla [name] is an 62 y.o. female.   Chief Complaint  Patient presents with   Follow-up    Ventral hernia-concerned about drainage    HPI: Samon is 61 yo female well-known to me with a symptomatic incisional hernia with loss of domain.  She Does have chronic abdominal pain.   She does have severe peripheral vascular disease requiring multiple revascularization.  To the point that most recently she underwent right above-the-knee amputation by Dr. Lucky Cowboy.  She is chronically anticoagulated .A  Few months ago she also developed MI  and has been followed by cardiology.  No recent chest pain .  No recent changes in chronic incarcerated ventral hernia She is currently on a wheelchair She did go to see a second opinion [facility] but she expressed that she does not want to go back again to him  Past Medical History:  Diagnosis Date   Abdominal aortic atherosclerosis ([place])    a. 05/2017 CTA abd/pelvis: significant atherosclerotic dzs of the infrarenal abd Ao w/ some mural thrombus. No aneurysm or dissection.   Acute focal ischemia of small intestine (HCC)    Acute right-sided low back pain with right-sided sciatica 06/13/2017   AKI (acute kidney injury) ([place]) 07/29/2017   Anemia    Anginal pain ([place])    a. 08/2012 Lexiscan MV: EF 54%, non ischemia/infarct.   Anxiety and depression    Baker's cyst of knee, right    a. 07/2016 U/S: 4.1 x 1.4 x 2.9 cystic structure in R poplitetal fossa.   Bell's palsy    CAD (coronary artery disease)    CHF (congestive heart failure) ([place])    a.) TTE 06/20/2017: EF 60-65%, no rwma, G1DD, no source of cardiac emboli.   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 05/2017 CTA Abd/pelvis: apparent thrombus or embolus  in prox SMA (70-90%); b. 05/2017 catheter directed tPA, mechanical thrombectomy, and stenting of the SMA.   Essential hypertension with goal blood pressure less than 140/90 01/19/2016   Gastroesophageal reflux disease 01/19/2016   GERD (gastroesophageal reflux disease)    H/O colectomy    History of 2019 novel coronavirus disease (COVID-19) 11/14/2019   History of kidney stones    Hyperlipidemia    Hypertension    Morbid obesity with BMI of 40.0-44.9, adult ([place])    NSTEMI (non-ST elevated myocardial infarction) ([place]) 06/27/2021   a.) high sensitivity troponins trended: 893 --> 1587 --> 1748 --> 868 --> 735 ng/L   Occlusive mesenteric ischemia ([place]) 06/19/2017   Osteoarthritis    PAD (peripheral artery disease) (HCC)    PAF (paroxysmal atrial fibrillation) (HCC)    a.) CHA2DS2-VASc = 6 (sex, CHF, HTN, DVT x2, aortic plaque). b.) rate/rhythm maintained on oral metoprolol tartrate; chronically anticoagulated with standard dose apixaban.   Palpitations    Peripheral edema    Personal history of other malignant neoplasm of skin 06/01/2016   Pneumonia    Pre-diabetes    Primary osteoarthritis of both knees 06/13/2017   SBO (small bowel obstruction) ([place]) 08/06/2017   Skin cancer of nose    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HCC)    Vitamin D deficiency     Past Surgical History:  Procedure Laterality Date  AMPUTATION Right 04/06/2022   Procedure: AMPUTATION ABOVE KNEE;  Surgeon: Algernon Huxley, MD;  Location: [facility];  Service: Vascular;  Laterality: Right;   AMPUTATION TOE Left 12/09/2019   Procedure: AMPUTATION TOE MPJ left;  Surgeon: Caroline More, DPM;  Location: [facility];  Service: Podiatry;  Laterality: Left;   APPENDECTOMY     APPLICATION OF WOUND VAC  12/01/2021   Procedure: APPLICATION OF WOUND VAC;  Surgeon: Algernon Huxley, MD;  Location: [facility];  Service: Vascular;;   APPLICATION OF WOUND VAC Right 05/03/2022   Procedure: APPLICATION OF WOUND VAC;  Surgeon: Algernon Huxley,  MD;  Location: [facility];  Service: Vascular;  Laterality: Right;   CHOLECYSTECTOMY     COLON SURGERY     COLONOSCOPY WITH PROPOFOL N/A 03/23/2022   Procedure: COLONOSCOPY WITH PROPOFOL;  Surgeon: Jonathon Bellows, MD;  Location: [facility];  Service: Gastroenterology;  Laterality: N/A;  rectal bleed   EMBOLECTOMY OR THROMBECTOMY, WITH OR WITHOUT CATHETER; FEMOROPOPLITEAL, AORTOILIAC ARTERY, BY LEG INCISION N/A 06/21/2020   Left - Decomp Fasciotomy Leg; Ant &/Or Lat Compart Only; Midline - Angiography, Visceral, Selective Or Supraselective (With Or Without Flush Aortogram); Left - Revascularize, Endovasc, Open/Percut, Iliac Artery, Unilat, Initial Vessel; W/Translum Stent, W/Angioplasty; Right - Revascularize, Endovasc, Open/Percut, Iliac Artery, Ea Add`L Ipsilateral; W/Translumin Stent, W/Angioplasty; Location [facility];   ENDARTERECTOMY FEMORAL Left 12/01/2021   Procedure: ENDARTERECTOMY FEMORAL;  Surgeon: Algernon Huxley, MD;  Location: [facility];  Service: Vascular;  Laterality: Left;   ENDOVASCULAR REPAIR/STENT GRAFT Left 12/01/2021   Procedure: ENDOVASCULAR REPAIR/STENT GRAFT;  Surgeon: Algernon Huxley, MD;  Location: [place] [facility];  Service: Cardiovascular;  Laterality: Left;   INCISION AND DRAINAGE OF WOUND Right 05/03/2022   Procedure: IRRIGATION AND DEBRIDEMENT RIGHT STUMP;  Surgeon: Algernon Huxley, MD;  Location: [facility];  Service: Vascular;  Laterality: Right;   LAPAROTOMY N/A 08/08/2017   Procedure: EXPLORATORY LAPAROTOMY POSSIBLE BOWEL RESECTION;  Surgeon: Jules Husbands, MD;  Location: [facility];  Service: General;  Laterality: N/A;   LEFT HEART CATH AND CORONARY ANGIOGRAPHY N/A 05/17/2021   Procedure: LEFT HEART CATH AND CORONARY ANGIOGRAPHY;  Surgeon: Yolonda Kida, MD;  Location: [place] [facility];  Service: Cardiovascular;  Laterality: N/A;   LOWER EXTREMITY ANGIOGRAPHY Left 11/17/2019   Procedure: LOWER EXTREMITY ANGIOGRAPHY;  Surgeon: Algernon Huxley, MD;  Location: [place] [facility];   Service: Cardiovascular;  Laterality: Left;   LOWER EXTREMITY ANGIOGRAPHY Right 12/13/2020   Procedure: LOWER EXTREMITY ANGIOGRAPHY;  Surgeon: Algernon Huxley, MD;  Location: [place] [facility];  Service: Cardiovascular;  Laterality: Right;   LOWER EXTREMITY ANGIOGRAPHY Right 01/10/2021   Procedure: LOWER EXTREMITY ANGIOGRAPHY;  Surgeon: Algernon Huxley, MD;  Location: [place] [facility];  Service: Cardiovascular;  Laterality: Right;   LOWER EXTREMITY ANGIOGRAPHY Right 01/24/2021   Procedure: LOWER EXTREMITY ANGIOGRAPHY;  Surgeon: Algernon Huxley, MD;  Location: [place] [facility];  Service: Cardiovascular;  Laterality: Right;   LOWER EXTREMITY ANGIOGRAPHY Bilateral 06/28/2021   Procedure: Lower Extremity Angiography;  Surgeon: Algernon Huxley, MD;  Location: [place] [facility];  Service: Cardiovascular;  Laterality: Bilateral;   LOWER EXTREMITY ANGIOGRAPHY Right 11/28/2021   Procedure: LOWER EXTREMITY ANGIOGRAPHY;  Surgeon: Algernon Huxley, MD;  Location: [place] [facility];  Service: Cardiovascular;  Laterality: Right;   LOWER EXTREMITY ANGIOGRAPHY Left 11/30/2021   Procedure: Lower Extremity Angiography;  Surgeon: Algernon Huxley, MD;  Location: [place] [facility];  Service:  Cardiovascular;  Laterality: Left;   LOWER EXTREMITY ANGIOGRAPHY Right 02/22/2022   Procedure: Lower Extremity Angiography;  Surgeon: Algernon Huxley, MD;  Location: [place] [facility];  Service: Cardiovascular;  Laterality: Right;   LOWER EXTREMITY ANGIOGRAPHY Right 03/13/2022   Procedure: Lower Extremity Angiography;  Surgeon: Algernon Huxley, MD;  Location: [place] [facility];  Service: Cardiovascular;  Laterality: Right;   LOWER EXTREMITY ANGIOGRAPHY Right 03/24/2022   Procedure: Lower Extremity Angiography;  Surgeon: Algernon Huxley, MD;  Location: [place] [facility];  Service: Cardiovascular;  Laterality: Right;   LOWER EXTREMITY INTERVENTION N/A 11/19/2019   Procedure: LOWER EXTREMITY INTERVENTION;  Surgeon: Algernon Huxley, MD;   Location: [place] [facility];  Service: Cardiovascular;  Laterality: N/A;   LOWER EXTREMITY INTERVENTION Bilateral 06/29/2021   Procedure: LOWER EXTREMITY INTERVENTION;  Surgeon: Algernon Huxley, MD;  Location: [place] [facility];  Service: Cardiovascular;  Laterality: Bilateral;   TEE WITHOUT CARDIOVERSION N/A 06/22/2017   Procedure: TRANSESOPHAGEAL ECHOCARDIOGRAM (TEE);  Surgeon: Wellington Hampshire, MD;  Location: [facility];  Service: Cardiovascular;  Laterality: N/A;   TOTAL KNEE ARTHROPLASTY Right 07/11/2018   Procedure: TOTAL KNEE ARTHROPLASTY;  Surgeon: Corky Mull, MD;  Location: [facility];  Service: Orthopedics;  Laterality: Right;   VAGINAL HYSTERECTOMY     VISCERAL ARTERY INTERVENTION N/A 06/20/2017   Procedure: Visceral Artery Intervention, possible aortic thrombectomy;  Surgeon: Algernon Huxley, MD;  Location: [place] [facility];  Service: Cardiovascular;  Laterality: N/A;   VISCERAL ARTERY INTERVENTION N/A 01/28/2018   Procedure: VISCERAL ARTERY INTERVENTION;  Surgeon: Algernon Huxley, MD;  Location: [place] [facility];  Service: Cardiovascular;  Laterality: N/A;    Family History  Problem Relation Age of Onset   Hypertension Mother    Heart disease Mother    Heart attack Mother    Breast cancer Mother    Hypertension Father    Breast cancer Paternal Aunt     Social History:  reports that she quit smoking about 7 months ago. Her smoking use included cigarettes. She smoked an average of 1 pack per day. She has never used smokeless tobacco. She reports that she does not drink alcohol and does not use drugs.  Allergies:  Allergies  Allergen Reactions   Gabapentin     Tremors    Bactrim [Sulfamethoxazole-Trimethoprim] Itching    Medications reviewed.    ROS Full ROS performed and is otherwise negative other than what is stated in HPI   BP 137/77   Pulse 80   Temp 98 F (36.7 C) (Oral)   Ht '5\' 2"'$  (1.575 m)   Wt 178 lb (80.7 kg)   SpO2 97%   BMI 32.56 kg/m    Physical Exam Physical Exam Vitals and nursing note reviewed. Exam conducted with a chaperone present.  Constitutional:      Appearance: Normal appearance. She is obese.  Pulmonary:     Effort: Pulmonary effort is normal. No respiratory distress.     Breath sounds: No stridor.  Abdominal:     General: Abdomen is flat. There is no distension.     Palpations: Abdomen is soft. There is no mass.     Tenderness: There is no abdominal tenderness. There is no guarding or rebound.     Hernia: A hernia is present.     Comments: Chronically incarcerated ventral hernia with loss of domain.  No peritonitis mild tenderness palpation diffusely  Musculoskeletal:        General:  Normal range of motion.  Skin:    General: Skin is warm and dry.     Capillary Refill: Capillary refill takes less than 2 seconds.  Neurological:     General: No focal deficit present.     Mental Status: She is alert and oriented to person, place, and time.  Psychiatric:        Mood and Affect: Mood normal.        Behavior: Behavior normal.        Thought Content: Thought content normal.        Judgment: Judgment normal.  \ Assessment/Plan: 61 year old female with multiple comorbidities massive ventral hernia with loss of domain. Very difficult situations since she dialysis carries significant and prohibitive perioperative risk.  The appropriate operation for her in theory should be an abdominal wall reconstruction. This is a very extensive surgery and presently I do not think that she will be able to tolerate it.  She might not survive the operation and if she dies she may carry prohibitive perioperative morbidity. SHe wants me to follow her up in about 6 months and reevaluate.  At this time no need for surgical intervention   Greater than 50% of the 30 minutes  visit was spent in counseling/coordination of care   Caroleen Hamman, MD [facility]

## 2022-06-22 ENCOUNTER — Encounter (INDEPENDENT_AMBULATORY_CARE_PROVIDER_SITE_OTHER): Payer: Medicare Other

## 2022-06-23 ENCOUNTER — Other Ambulatory Visit (INDEPENDENT_AMBULATORY_CARE_PROVIDER_SITE_OTHER): Payer: Self-pay | Admitting: Nurse Practitioner

## 2022-06-23 MED ORDER — OXYCODONE-ACETAMINOPHEN 5-325 MG PO TABS: 1.0000 | ORAL_TABLET | Freq: Four times a day (QID) | ORAL | 0 refills | Status: DC | PRN

## 2022-06-26 ENCOUNTER — Encounter (INDEPENDENT_AMBULATORY_CARE_PROVIDER_SITE_OTHER): Payer: Self-pay | Admitting: Nurse Practitioner

## 2022-06-27 ENCOUNTER — Encounter (INDEPENDENT_AMBULATORY_CARE_PROVIDER_SITE_OTHER): Payer: Self-pay | Admitting: Nurse Practitioner

## 2022-06-27 ENCOUNTER — Ambulatory Visit (INDEPENDENT_AMBULATORY_CARE_PROVIDER_SITE_OTHER): Payer: Medicare Other | Admitting: Nurse Practitioner

## 2022-06-27 VITALS — BP 127/61 | HR 78 | Resp 16

## 2022-06-27 DIAGNOSIS — Z89611 Acquired absence of right leg above knee: Secondary | ICD-10-CM

## 2022-06-27 NOTE — Progress Notes (Signed)
Patient came in for right above knee amputation wound dressing change. Patient wound has decrease in size and close to healing. I applied Aquacel and 4x4. The patient will follow up on Thursday.

## 2022-06-28 ENCOUNTER — Other Ambulatory Visit: Payer: Self-pay | Admitting: Student

## 2022-06-28 DIAGNOSIS — G8929 Other chronic pain: Secondary | ICD-10-CM

## 2022-06-28 DIAGNOSIS — M7521 Bicipital tendinitis, right shoulder: Secondary | ICD-10-CM

## 2022-06-28 DIAGNOSIS — M7581 Other shoulder lesions, right shoulder: Secondary | ICD-10-CM

## 2022-06-29 ENCOUNTER — Ambulatory Visit (INDEPENDENT_AMBULATORY_CARE_PROVIDER_SITE_OTHER): Payer: Medicare Other | Admitting: Nurse Practitioner

## 2022-06-29 ENCOUNTER — Encounter (INDEPENDENT_AMBULATORY_CARE_PROVIDER_SITE_OTHER): Payer: Self-pay

## 2022-06-29 VITALS — BP 102/64 | HR 72 | Resp 16

## 2022-06-29 DIAGNOSIS — Z89611 Acquired absence of right leg above knee: Secondary | ICD-10-CM

## 2022-06-29 NOTE — Progress Notes (Signed)
Patient came in for wound dressing change for right above knee amputation. The wound is almost completely healed. The wound was cleaned and new dressing was applied. Patient schedule to return on Monday for next visit.

## 2022-06-30 ENCOUNTER — Encounter (INDEPENDENT_AMBULATORY_CARE_PROVIDER_SITE_OTHER): Payer: Self-pay | Admitting: Nurse Practitioner

## 2022-07-03 ENCOUNTER — Other Ambulatory Visit (INDEPENDENT_AMBULATORY_CARE_PROVIDER_SITE_OTHER): Payer: Self-pay | Admitting: Nurse Practitioner

## 2022-07-03 ENCOUNTER — Ambulatory Visit (INDEPENDENT_AMBULATORY_CARE_PROVIDER_SITE_OTHER): Payer: Medicare Other | Admitting: Nurse Practitioner

## 2022-07-03 ENCOUNTER — Encounter (INDEPENDENT_AMBULATORY_CARE_PROVIDER_SITE_OTHER): Payer: Self-pay | Admitting: Nurse Practitioner

## 2022-07-03 ENCOUNTER — Ambulatory Visit
Admission: RE | Admit: 2022-07-03 | Discharge: 2022-07-03 | Disposition: A | Discharge status: Home / Self Care | Payer: Medicare Other | Source: Ambulatory Visit | Attending: Student | Admitting: Student

## 2022-07-03 ENCOUNTER — Ambulatory Visit: Payer: Medicare Other | Admitting: Surgery

## 2022-07-03 VITALS — BP 124/79 | HR 86 | Resp 16

## 2022-07-03 DIAGNOSIS — Z89611 Acquired absence of right leg above knee: Secondary | ICD-10-CM

## 2022-07-03 DIAGNOSIS — M7521 Bicipital tendinitis, right shoulder: Secondary | ICD-10-CM | POA: Insufficient documentation

## 2022-07-03 DIAGNOSIS — M25511 Pain in right shoulder: Secondary | ICD-10-CM | POA: Insufficient documentation

## 2022-07-03 DIAGNOSIS — G8929 Other chronic pain: Secondary | ICD-10-CM | POA: Insufficient documentation

## 2022-07-03 DIAGNOSIS — M7581 Other shoulder lesions, right shoulder: Secondary | ICD-10-CM | POA: Insufficient documentation

## 2022-07-03 MED ORDER — GADOBUTROL 1 MMOL/ML IV SOLN
10.0000 mL | Freq: Once | INTRAVENOUS | Status: AC | PRN
  Administered 2022-07-03: 10 mL via INTRAVENOUS

## 2022-07-03 NOTE — Progress Notes (Signed)
Patient came in for wound dressing change. Patient wound is healing well and close to healing. Wound was dressed with aquacel and 4x4. Patient is schedule to return on Thursday.

## 2022-07-06 ENCOUNTER — Other Ambulatory Visit (INDEPENDENT_AMBULATORY_CARE_PROVIDER_SITE_OTHER): Payer: Self-pay | Admitting: Nurse Practitioner

## 2022-07-06 ENCOUNTER — Ambulatory Visit (INDEPENDENT_AMBULATORY_CARE_PROVIDER_SITE_OTHER): Payer: Medicare Other | Admitting: Nurse Practitioner

## 2022-07-06 ENCOUNTER — Encounter (INDEPENDENT_AMBULATORY_CARE_PROVIDER_SITE_OTHER): Payer: Self-pay | Admitting: Nurse Practitioner

## 2022-07-06 ENCOUNTER — Encounter (INDEPENDENT_AMBULATORY_CARE_PROVIDER_SITE_OTHER): Payer: Self-pay

## 2022-07-06 VITALS — BP 125/88 | HR 73 | Resp 16

## 2022-07-06 DIAGNOSIS — Z89611 Acquired absence of right leg above knee: Secondary | ICD-10-CM

## 2022-07-06 MED ORDER — OXYCODONE-ACETAMINOPHEN 5-325 MG PO TABS: 1.0000 | ORAL_TABLET | Freq: Four times a day (QID) | ORAL | 0 refills | Status: DC | PRN

## 2022-07-06 NOTE — Progress Notes (Signed)
Patient came in for wound dressing change for right above knee amputation. Patient wound is healing and decreasing in size. Patient wound was dress with Aquacel and 4x4. Patient will return on Monday for next follow up.

## 2022-07-06 NOTE — Progress Notes (Signed)
Subjective:    Patient ID: Carla [name], female    DOB: 1961/07/07, 61 y.o.   MRN: [id number] Chief Complaint  Patient presents with   Follow-up    Right stump wound check    Carla [name] is a 61 year old female that presents today for follow-up wound evaluation.  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  She subsequently had a very large dehiscence of her stump.  She underwent debridement with wound VAC placement.  The patient has been having her wound dressings changed in office due to issues with home health provider location.  We recently transitioned the patient from wound VAC to Aquacel.  The patient has been replacing this every three days.  Currently there is no fibrinous exudate.  No signs symptoms of infection.  It has shown drastic improvement from the initial debridement.  She is tolerating dressing changes well.    Review of Systems  Skin:  Positive for wound.  All other systems reviewed and are negative.      Objective:   Physical Exam Vitals reviewed.  HENT:     Head: Normocephalic.  Cardiovascular:     Rate and Rhythm: Normal rate.  Pulmonary:     Effort: Pulmonary effort is normal.  Musculoskeletal:     Right Lower Extremity: Right leg is amputated above knee.  Skin:    General: Skin is warm and dry.  Neurological:     Mental Status: She is alert and oriented to person, place, and time.  Psychiatric:        Mood and Affect: Mood normal.        Behavior: Behavior normal.        Thought Content: Thought content normal.        Judgment: Judgment normal.     BP 135/86 (BP Location: Left Arm)   Pulse 75   Resp 16   Past Medical History:  Diagnosis Date   Abdominal aortic atherosclerosis ([place])    a. 05/2017 CTA abd/pelvis: significant atherosclerotic dzs of the infrarenal abd Ao w/ some mural thrombus. No aneurysm or dissection.   Acute focal ischemia of small intestine (HCC)    Acute right-sided low back pain with right-sided sciatica  06/13/2017   AKI (acute kidney injury) ([place]) 07/29/2017   Anemia    Anginal pain ([place])    a. 08/2012 Lexiscan MV: EF 54%, non ischemia/infarct.   Anxiety and depression    Baker's cyst of knee, right    a. 07/2016 U/S: 4.1 x 1.4 x 2.9 cystic structure in R poplitetal fossa.   Bell's palsy    CAD (coronary artery disease)    CHF (congestive heart failure) ([place])    a.) TTE 06/20/2017: EF 60-65%, no rwma, G1DD, no source of cardiac emboli.   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 05/2017 CTA Abd/pelvis: apparent thrombus or embolus in prox SMA (70-90%); b. 05/2017 catheter directed tPA, mechanical thrombectomy, and stenting of the SMA.   Essential hypertension with goal blood pressure less than 140/90 01/19/2016   Gastroesophageal reflux disease 01/19/2016   GERD (gastroesophageal reflux disease)    H/O colectomy    History of 2019 novel coronavirus disease (COVID-19) 11/14/2019   History of kidney stones    Hyperlipidemia  Hypertension    Morbid obesity with BMI of 40.0-44.9, adult (HCC)    NSTEMI (non-ST elevated myocardial infarction) ([place]) 06/27/2021   a.) high sensitivity troponins trended: 893 --> 1587 --> 1748 --> 868 --> 735 ng/L   Occlusive mesenteric ischemia (HCC) 06/19/2017   Osteoarthritis    PAD (peripheral artery disease) (HCC)    PAF (paroxysmal atrial fibrillation) (HCC)    a.) CHA2DS2-VASc = 6 (sex, CHF, HTN, DVT x2, aortic plaque). b.) rate/rhythm maintained on oral metoprolol tartrate; chronically anticoagulated with standard dose apixaban.   Palpitations    Peripheral edema    Personal history of other malignant neoplasm of skin 06/01/2016   Pneumonia    Pre-diabetes    Primary osteoarthritis of both knees 06/13/2017   SBO (small bowel obstruction) ([place])  08/06/2017   Skin cancer of nose    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HCC)    Vitamin D deficiency     Social History   Socioeconomic History   Marital status: Single    Spouse name: Not on file   Number of children: Not on file   Years of education: Not on file   Highest education level: Not on file  Occupational History   Occupation: Unemployed  Tobacco Use   Smoking status: Former    Packs/day: 1.00    Types: Cigarettes    Quit date: 2023    Years since quitting: 0.7   Smokeless tobacco: Never  Vaping Use   Vaping Use: Never used  Substance and Sexual Activity   Alcohol use: No   Drug use: No   Sexual activity: Not on file  Other Topics Concern   Not on file  Social History Narrative   Lives in [place] with daughter   Social Determinants of Health   Financial Resource Strain: Not on file  Food Insecurity: Not on file  Transportation Needs: Not on file  Physical Activity: Not on file  Stress: Not on file  Social Connections: Not on file  Intimate Partner Violence: Not on file    Past Surgical History:  Procedure Laterality Date   AMPUTATION Right 04/06/2022   Procedure: AMPUTATION ABOVE KNEE;  Surgeon: Algernon Huxley, MD;  Location: [facility];  Service: Vascular;  Laterality: Right;   AMPUTATION TOE Left 12/09/2019   Procedure: AMPUTATION TOE MPJ left;  Surgeon: Caroline More, DPM;  Location: [facility];  Service: Podiatry;  Laterality: Left;   APPENDECTOMY     APPLICATION OF WOUND VAC  12/01/2021   Procedure: APPLICATION OF WOUND VAC;  Surgeon: Algernon Huxley, MD;  Location: [facility];  Service: Vascular;;   APPLICATION OF WOUND VAC Right 05/03/2022   Procedure: APPLICATION OF WOUND VAC;  Surgeon: Algernon Huxley, MD;  Location: [facility];  Service: Vascular;  Laterality: Right;   CHOLECYSTECTOMY     COLON SURGERY     COLONOSCOPY WITH PROPOFOL N/A 03/23/2022   Procedure: COLONOSCOPY WITH PROPOFOL;  Surgeon: Jonathon Bellows, MD;  Location[facility];  Service:  Gastroenterology;  Laterality: N/A;  rectal bleed   EMBOLECTOMY OR THROMBECTOMY, WITH OR WITHOUT CATHETER; FEMOROPOPLITEAL, AORTOILIAC ARTERY, BY LEG INCISION N/A 06/21/2020   Left - Decomp Fasciotomy Leg; Ant &/Or Lat Compart Only; Midline - Angiography, Visceral, Selective Or Supraselective (With Or Without Flush Aortogram); Left - Revascularize, Endovasc, Open/Percut, Iliac Artery, Unilat, Initial Vessel; W/Translum Stent, W/Angioplasty; Right - Revascularize, Endovasc, Open/Percut, Iliac Artery, Ea Add`L Ipsilateral; W/Translumin Stent, W/Angioplasty; Location [facility];   ENDARTERECTOMY FEMORAL Left 12/01/2021   Procedure: ENDARTERECTOMY  FEMORAL;  Surgeon: Algernon Huxley, MD;  Location: [facility];  Service: Vascular;  Laterality: Left;   ENDOVASCULAR REPAIR/STENT GRAFT Left 12/01/2021   Procedure: ENDOVASCULAR REPAIR/STENT GRAFT;  Surgeon: Algernon Huxley, MD;  Location: [place] [facility];  Service: Cardiovascular;  Laterality: Left;   INCISION AND DRAINAGE OF WOUND Right 05/03/2022   Procedure: IRRIGATION AND DEBRIDEMENT RIGHT STUMP;  Surgeon: Algernon Huxley, MD;  Location: [facility];  Service: Vascular;  Laterality: Right;   LAPAROTOMY N/A 08/08/2017   Procedure: EXPLORATORY LAPAROTOMY POSSIBLE BOWEL RESECTION;  Surgeon: Jules Husbands, MD;  Location: [facility];  Service: General;  Laterality: N/A;   LEFT HEART CATH AND CORONARY ANGIOGRAPHY N/A 05/17/2021   Procedure: LEFT HEART CATH AND CORONARY ANGIOGRAPHY;  Surgeon: Yolonda Kida, MD;  Location: [place] [facility];  Service: Cardiovascular;  Laterality: N/A;   LOWER EXTREMITY ANGIOGRAPHY Left 11/17/2019   Procedure: LOWER EXTREMITY ANGIOGRAPHY;  Surgeon: Algernon Huxley, MD;  Location: [place] [facility];  Service: Cardiovascular;  Laterality: Left;   LOWER EXTREMITY ANGIOGRAPHY Right 12/13/2020   Procedure: LOWER EXTREMITY ANGIOGRAPHY;  Surgeon: Algernon Huxley, MD;  Location: [place] [facility];  Service: Cardiovascular;  Laterality: Right;   LOWER  EXTREMITY ANGIOGRAPHY Right 01/10/2021   Procedure: LOWER EXTREMITY ANGIOGRAPHY;  Surgeon: Algernon Huxley, MD;  Location: [place] [facility];  Service: Cardiovascular;  Laterality: Right;   LOWER EXTREMITY ANGIOGRAPHY Right 01/24/2021   Procedure: LOWER EXTREMITY ANGIOGRAPHY;  Surgeon: Algernon Huxley, MD;  Location: [place] [facility];  Service: Cardiovascular;  Laterality: Right;   LOWER EXTREMITY ANGIOGRAPHY Bilateral 06/28/2021   Procedure: Lower Extremity Angiography;  Surgeon: Algernon Huxley, MD;  Location: [place] [facility];  Service: Cardiovascular;  Laterality: Bilateral;   LOWER EXTREMITY ANGIOGRAPHY Right 11/28/2021   Procedure: LOWER EXTREMITY ANGIOGRAPHY;  Surgeon: Algernon Huxley, MD;  Location: [place] [facility];  Service: Cardiovascular;  Laterality: Right;   LOWER EXTREMITY ANGIOGRAPHY Left 11/30/2021   Procedure: Lower Extremity Angiography;  Surgeon: Algernon Huxley, MD;  Location: [place] [facility];  Service: Cardiovascular;  Laterality: Left;   LOWER EXTREMITY ANGIOGRAPHY Right 02/22/2022   Procedure: Lower Extremity Angiography;  Surgeon: Algernon Huxley, MD;  Location: [place] [facility];  Service: Cardiovascular;  Laterality: Right;   LOWER EXTREMITY ANGIOGRAPHY Right 03/13/2022   Procedure: Lower Extremity Angiography;  Surgeon: Algernon Huxley, MD;  Location: [place] [facility];  Service: Cardiovascular;  Laterality: Right;   LOWER EXTREMITY ANGIOGRAPHY Right 03/24/2022   Procedure: Lower Extremity Angiography;  Surgeon: Algernon Huxley, MD;  Location: [place] [facility];  Service: Cardiovascular;  Laterality: Right;   LOWER EXTREMITY INTERVENTION N/A 11/19/2019   Procedure: LOWER EXTREMITY INTERVENTION;  Surgeon: Algernon Huxley, MD;  Location: [place] [facility];  Service: Cardiovascular;  Laterality: N/A;   LOWER EXTREMITY INTERVENTION Bilateral 06/29/2021   Procedure: LOWER EXTREMITY INTERVENTION;  Surgeon: Algernon Huxley, MD;  Location: [place] [facility];  Service: Cardiovascular;   Laterality: Bilateral;   TEE WITHOUT CARDIOVERSION N/A 06/22/2017   Procedure: TRANSESOPHAGEAL ECHOCARDIOGRAM (TEE);  Surgeon: Wellington Hampshire, MD;  Location: [facility];  Service: Cardiovascular;  Laterality: N/A;   TOTAL KNEE ARTHROPLASTY Right 07/11/2018   Procedure: TOTAL KNEE ARTHROPLASTY;  Surgeon: Corky Mull, MD;  Location: [facility];  Service: Orthopedics;  Laterality: Right;   VAGINAL HYSTERECTOMY     VISCERAL ARTERY INTERVENTION N/A 06/20/2017   Procedure: Visceral Artery Intervention, possible aortic  thrombectomy;  Surgeon: Algernon Huxley, MD;  Location: [place] [facility];  Service: Cardiovascular;  Laterality: N/A;   VISCERAL ARTERY INTERVENTION N/A 01/28/2018   Procedure: VISCERAL ARTERY INTERVENTION;  Surgeon: Algernon Huxley, MD;  Location: [place] [facility];  Service: Cardiovascular;  Laterality: N/A;    Family History  Problem Relation Age of Onset   Hypertension Mother    Heart disease Mother    Heart attack Mother    Breast cancer Mother    Hypertension Father    Breast cancer Paternal Aunt     Allergies  Allergen Reactions   Gabapentin     Tremors    Bactrim [Sulfamethoxazole-Trimethoprim] Itching       Latest Ref Rng & Units 05/04/2022    5:39 AM 05/03/2022    4:44 AM 05/02/2022   11:58 AM  CBC  WBC 4.0 - 10.5 K/uL 5.1  6.4  7.4   Hemoglobin 12.0 - 15.0 g/dL 9.4  9.5  9.4   Hematocrit 36.0 - 46.0 % 30.8  31.7  31.2   Platelets 150 - 400 K/uL 275  267  282       CMP     Component Value Date/Time   NA 142 05/03/2022 0444   NA 140 02/12/2015 1137   K 3.9 05/03/2022 0444   K 2.9 (L) 02/12/2015 1137   CL 109 05/03/2022 0444   CL 107 02/12/2015 1137   CO2 27 05/03/2022 0444   CO2 27 02/12/2015 1137   GLUCOSE 108 (H) 05/03/2022 0444   GLUCOSE 120 (H) 02/12/2015 1137   BUN 15 05/03/2022 0444   BUN 15 02/12/2015 1137   CREATININE 0.65 05/04/2022 0539   CREATININE 0.78 02/12/2015 1137   CALCIUM 8.5 (L) 05/03/2022 0444   CALCIUM 9.0 02/12/2015  1137   PROT 6.0 (L) 05/03/2022 0444   PROT 7.0 09/14/2020 1139   PROT 7.1 01/19/2015 1112   ALBUMIN 2.7 (L) 05/03/2022 0444   ALBUMIN 3.9 09/14/2020 1139   ALBUMIN 3.8 01/19/2015 1112   AST 10 (L) 05/03/2022 0444   AST 17 01/19/2015 1112   ALT 10 05/03/2022 0444   ALT 11 (L) 01/19/2015 1112   ALKPHOS 93 05/03/2022 0444   ALKPHOS 84 01/19/2015 1112   BILITOT 0.6 05/03/2022 0444   BILITOT <0.2 09/14/2020 1139   BILITOT 0.5 01/19/2015 1112   GFRNONAA >60 05/04/2022 0539   GFRNONAA >60 02/12/2015 1137   GFRAA >60 11/23/2019 1330   GFRAA >60 02/12/2015 1137     No results found.     Assessment & Plan:   1. S/P AKA (above knee amputation) unilateral, right ([place]) Ammie Dubinsky's wound is progressing and there is some decrease within the wound edges.  No fibrinous exudate in the wound.  Early signs of granulation.  We will continue to have the patient present to the office twice a week for dressing changes.  We will also continue with patient's pain medication.  Patient will contact us for sooner follow-up if needed.   Current Outpatient Medications on File Prior to Visit  Medication Sig Dispense Refill   Amino Acids-Protein Hydrolys (PRO-STAT) LIQD Take 30 mLs by mouth in the morning and at bedtime.     apixaban (ELIQUIS) 5 MG TABS tablet Take 1 tablet (5 mg total) by mouth 2 (two) times daily. 60 tablet 11   aspirin EC 81 MG tablet Take 81 mg by mouth daily. Swallow whole.     atorvastatin (LIPITOR) 80 MG tablet Take 80 mg  by mouth at bedtime.     COMBIVENT RESPIMAT 20-100 MCG/ACT AERS respimat      feeding supplement (ENSURE ENLIVE / ENSURE PLUS) LIQD Take 237 mLs by mouth 2 (two) times daily between meals. 237 mL 12   ferrous sulfate 325 (65 FE) MG tablet Take 325 mg by mouth daily.     furosemide (LASIX) 40 MG tablet Take 0.5 tablets (20 mg total) by mouth daily as needed. For leg swelling 20 tablet 0   isosorbide mononitrate (IMDUR) 60 MG 24 hr tablet Take 60 mg by mouth daily.      methocarbamol (ROBAXIN) 500 MG tablet Take 1 tablet (500 mg total) by mouth every 8 (eight) hours as needed for muscle spasms. 30 tablet 0   Multiple Vitamin (MULTIVITAMIN WITH MINERALS) TABS tablet Take 1 tablet by mouth daily. 30 tablet 0   omeprazole (PRILOSEC) 20 MG capsule Take 20 mg by mouth 2 (two) times daily before a meal.     ondansetron (ZOFRAN) 4 MG tablet Take 4 mg by mouth every 8 (eight) hours as needed for nausea or vomiting.     pregabalin (LYRICA) 100 MG capsule Take 1 capsule (100 mg total) by mouth 3 (three) times daily. 90 capsule 3   citalopram (CELEXA) 40 MG tablet Take 1 tablet (40 mg total) by mouth daily. 30 tablet 2   No current facility-administered medications on file prior to visit.    There are no Patient Instructions on file for this visit. No follow-ups on file.   Kris Hartmann, NP

## 2022-07-10 ENCOUNTER — Encounter (INDEPENDENT_AMBULATORY_CARE_PROVIDER_SITE_OTHER): Payer: Self-pay

## 2022-07-10 ENCOUNTER — Ambulatory Visit (INDEPENDENT_AMBULATORY_CARE_PROVIDER_SITE_OTHER): Payer: Medicare Other | Admitting: Nurse Practitioner

## 2022-07-10 VITALS — BP 97/64 | HR 74 | Resp 16

## 2022-07-10 DIAGNOSIS — Z89611 Acquired absence of right leg above knee: Secondary | ICD-10-CM

## 2022-07-10 NOTE — Progress Notes (Signed)
Patient came in for wound dressing change for right above knee amputation. Patient wound is close to healing. The applied Aquacel and 4x4 over the wound. Patient is schedule to return on Thursday for next follow up.

## 2022-07-17 ENCOUNTER — Ambulatory Visit (INDEPENDENT_AMBULATORY_CARE_PROVIDER_SITE_OTHER): Payer: Medicare Other | Admitting: Nurse Practitioner

## 2022-07-17 ENCOUNTER — Encounter (INDEPENDENT_AMBULATORY_CARE_PROVIDER_SITE_OTHER): Payer: Self-pay

## 2022-07-17 VITALS — BP 84/55 | HR 67 | Resp 16

## 2022-07-17 DIAGNOSIS — Z89611 Acquired absence of right leg above knee: Secondary | ICD-10-CM

## 2022-07-17 NOTE — Progress Notes (Signed)
Patient came in for right aka wound dressing change. I applied aquacel and 4x4 over the wound. The patient wound has decreased and close to healing. Patient will return on Thursday.

## 2022-07-20 ENCOUNTER — Ambulatory Visit (INDEPENDENT_AMBULATORY_CARE_PROVIDER_SITE_OTHER): Payer: Medicare Other | Admitting: Nurse Practitioner

## 2022-07-20 ENCOUNTER — Other Ambulatory Visit (INDEPENDENT_AMBULATORY_CARE_PROVIDER_SITE_OTHER): Payer: Self-pay | Admitting: Nurse Practitioner

## 2022-07-20 ENCOUNTER — Encounter (INDEPENDENT_AMBULATORY_CARE_PROVIDER_SITE_OTHER): Payer: Self-pay

## 2022-07-20 ENCOUNTER — Telehealth (INDEPENDENT_AMBULATORY_CARE_PROVIDER_SITE_OTHER): Payer: Self-pay

## 2022-07-20 VITALS — BP 96/73 | HR 69 | Resp 16

## 2022-07-20 DIAGNOSIS — Z89611 Acquired absence of right leg above knee: Secondary | ICD-10-CM

## 2022-07-20 MED ORDER — OXYCODONE-ACETAMINOPHEN 5-325 MG PO TABS: 1.0000 | ORAL_TABLET | Freq: Four times a day (QID) | ORAL | 0 refills | Status: DC | PRN

## 2022-07-20 NOTE — Telephone Encounter (Signed)
Refill sent however her last fill was on 9/16 and so she will be able to pick up on 9/30

## 2022-07-20 NOTE — Progress Notes (Signed)
Patient came for wound dressing change with right aka incision. Patient wound is healing and decreased in size. Patient wound was covered with Aquacel and 4x4. Patient will follow up next week for wound check.

## 2022-07-21 ENCOUNTER — Encounter (INDEPENDENT_AMBULATORY_CARE_PROVIDER_SITE_OTHER): Payer: Self-pay | Admitting: Nurse Practitioner

## 2022-07-21 NOTE — Telephone Encounter (Signed)
Patient return call to the office and informed that she has been having some  swelling with her left foot. Patient is also having some toe cramping and pain for the past couple weeks. I spoke with Dr Lucky Cowboy and he recommended for the patient come in with ABI see himself or Arna Medici.

## 2022-07-21 NOTE — Telephone Encounter (Signed)
Left a message on patient voicemail that refill has been sent and that she want be able to pick until tomorrow

## 2022-07-22 ENCOUNTER — Inpatient Hospital Stay
Admission: EM | Admit: 2022-07-22 | Discharge: 2022-08-12 | DRG: 253 | Disposition: A | Discharge status: Skilled Nursing Facility | Payer: Medicare Other | Attending: Internal Medicine | Admitting: Internal Medicine

## 2022-07-22 ENCOUNTER — Emergency Department: Payer: Medicare Other

## 2022-07-22 DIAGNOSIS — E7800 Pure hypercholesterolemia, unspecified: Secondary | ICD-10-CM | POA: Diagnosis present

## 2022-07-22 DIAGNOSIS — K551 Chronic vascular disorders of intestine: Secondary | ICD-10-CM | POA: Diagnosis present

## 2022-07-22 DIAGNOSIS — I5032 Chronic diastolic (congestive) heart failure: Secondary | ICD-10-CM | POA: Diagnosis present

## 2022-07-22 DIAGNOSIS — Z22322 Carrier or suspected carrier of Methicillin resistant Staphylococcus aureus: Secondary | ICD-10-CM

## 2022-07-22 DIAGNOSIS — T82856A Stenosis of peripheral vascular stent, initial encounter: Secondary | ICD-10-CM | POA: Diagnosis present

## 2022-07-22 DIAGNOSIS — L7632 Postprocedural hematoma of skin and subcutaneous tissue following other procedure: Secondary | ICD-10-CM | POA: Diagnosis not present

## 2022-07-22 DIAGNOSIS — T148XXA Other injury of unspecified body region, initial encounter: Secondary | ICD-10-CM | POA: Diagnosis not present

## 2022-07-22 DIAGNOSIS — D62 Acute posthemorrhagic anemia: Secondary | ICD-10-CM

## 2022-07-22 DIAGNOSIS — Z79899 Other long term (current) drug therapy: Secondary | ICD-10-CM

## 2022-07-22 DIAGNOSIS — I700 Atherosclerosis of aorta: Secondary | ICD-10-CM | POA: Diagnosis present

## 2022-07-22 DIAGNOSIS — K439 Ventral hernia without obstruction or gangrene: Secondary | ICD-10-CM | POA: Diagnosis present

## 2022-07-22 DIAGNOSIS — F419 Anxiety disorder, unspecified: Secondary | ICD-10-CM | POA: Diagnosis present

## 2022-07-22 DIAGNOSIS — I998 Other disorder of circulatory system: Principal | ICD-10-CM

## 2022-07-22 DIAGNOSIS — Z89611 Acquired absence of right leg above knee: Secondary | ICD-10-CM

## 2022-07-22 DIAGNOSIS — L98499 Non-pressure chronic ulcer of skin of other sites with unspecified severity: Secondary | ICD-10-CM | POA: Diagnosis present

## 2022-07-22 DIAGNOSIS — D6859 Other primary thrombophilia: Secondary | ICD-10-CM | POA: Diagnosis present

## 2022-07-22 DIAGNOSIS — Z8616 Personal history of COVID-19: Secondary | ICD-10-CM

## 2022-07-22 DIAGNOSIS — I743 Embolism and thrombosis of arteries of the lower extremities: Secondary | ICD-10-CM | POA: Diagnosis present

## 2022-07-22 DIAGNOSIS — G8929 Other chronic pain: Secondary | ICD-10-CM | POA: Diagnosis present

## 2022-07-22 DIAGNOSIS — Z7901 Long term (current) use of anticoagulants: Secondary | ICD-10-CM

## 2022-07-22 DIAGNOSIS — Z560 Unemployment, unspecified: Secondary | ICD-10-CM

## 2022-07-22 DIAGNOSIS — I252 Old myocardial infarction: Secondary | ICD-10-CM

## 2022-07-22 DIAGNOSIS — I480 Paroxysmal atrial fibrillation: Secondary | ICD-10-CM | POA: Diagnosis present

## 2022-07-22 DIAGNOSIS — J449 Chronic obstructive pulmonary disease, unspecified: Secondary | ICD-10-CM | POA: Diagnosis present

## 2022-07-22 DIAGNOSIS — I739 Peripheral vascular disease, unspecified: Secondary | ICD-10-CM | POA: Diagnosis present

## 2022-07-22 DIAGNOSIS — L02415 Cutaneous abscess of right lower limb: Secondary | ICD-10-CM | POA: Diagnosis present

## 2022-07-22 DIAGNOSIS — I110 Hypertensive heart disease with heart failure: Secondary | ICD-10-CM | POA: Diagnosis present

## 2022-07-22 DIAGNOSIS — I70222 Atherosclerosis of native arteries of extremities with rest pain, left leg: Secondary | ICD-10-CM | POA: Diagnosis present

## 2022-07-22 DIAGNOSIS — E860 Dehydration: Secondary | ICD-10-CM | POA: Diagnosis present

## 2022-07-22 DIAGNOSIS — Z87891 Personal history of nicotine dependence: Secondary | ICD-10-CM

## 2022-07-22 DIAGNOSIS — I959 Hypotension, unspecified: Secondary | ICD-10-CM | POA: Diagnosis present

## 2022-07-22 DIAGNOSIS — Z85828 Personal history of other malignant neoplasm of skin: Secondary | ICD-10-CM

## 2022-07-22 DIAGNOSIS — K219 Gastro-esophageal reflux disease without esophagitis: Secondary | ICD-10-CM | POA: Diagnosis present

## 2022-07-22 DIAGNOSIS — S7011XA Contusion of right thigh, initial encounter: Secondary | ICD-10-CM

## 2022-07-22 DIAGNOSIS — Z9049 Acquired absence of other specified parts of digestive tract: Secondary | ICD-10-CM

## 2022-07-22 DIAGNOSIS — N179 Acute kidney failure, unspecified: Secondary | ICD-10-CM | POA: Diagnosis present

## 2022-07-22 DIAGNOSIS — Z95828 Presence of other vascular implants and grafts: Secondary | ICD-10-CM

## 2022-07-22 DIAGNOSIS — Z881 Allergy status to other antibiotic agents status: Secondary | ICD-10-CM

## 2022-07-22 DIAGNOSIS — F32A Depression, unspecified: Secondary | ICD-10-CM | POA: Diagnosis present

## 2022-07-22 DIAGNOSIS — Z888 Allergy status to other drugs, medicaments and biological substances status: Secondary | ICD-10-CM

## 2022-07-22 DIAGNOSIS — Z8249 Family history of ischemic heart disease and other diseases of the circulatory system: Secondary | ICD-10-CM

## 2022-07-22 DIAGNOSIS — Y831 Surgical operation with implant of artificial internal device as the cause of abnormal reaction of the patient, or of later complication, without mention of misadventure at the time of the procedure: Secondary | ICD-10-CM | POA: Diagnosis present

## 2022-07-22 DIAGNOSIS — R739 Hyperglycemia, unspecified: Secondary | ICD-10-CM | POA: Diagnosis not present

## 2022-07-22 DIAGNOSIS — I2510 Atherosclerotic heart disease of native coronary artery without angina pectoris: Secondary | ICD-10-CM | POA: Diagnosis present

## 2022-07-22 DIAGNOSIS — K5904 Chronic idiopathic constipation: Secondary | ICD-10-CM | POA: Diagnosis present

## 2022-07-22 LAB — CBC WITH DIFFERENTIAL/PLATELET
Abs Immature Granulocytes: 0.04 10*3/uL (ref 0.00–0.07)
Basophils Absolute: 0 10*3/uL (ref 0.0–0.1)
Basophils Relative: 1 %
Eosinophils Absolute: 0.2 10*3/uL (ref 0.0–0.5)
Eosinophils Relative: 2 %
HCT: 38.1 % (ref 36.0–46.0)
Hemoglobin: 11.9 g/dL — ABNORMAL LOW (ref 12.0–15.0)
Immature Granulocytes: 1 %
Lymphocytes Relative: 33 %
Lymphs Abs: 2.7 10*3/uL (ref 0.7–4.0)
MCH: 27.6 pg (ref 26.0–34.0)
MCHC: 31.2 g/dL (ref 30.0–36.0)
MCV: 88.4 fL (ref 80.0–100.0)
Monocytes Absolute: 0.5 10*3/uL (ref 0.1–1.0)
Monocytes Relative: 7 %
Neutro Abs: 4.8 10*3/uL (ref 1.7–7.7)
Neutrophils Relative %: 56 %
Platelets: 237 10*3/uL (ref 150–400)
RBC: 4.31 MIL/uL (ref 3.87–5.11)
RDW: 16.9 % — ABNORMAL HIGH (ref 11.5–15.5)
WBC: 8.3 10*3/uL (ref 4.0–10.5)
nRBC: 0 % (ref 0.0–0.2)

## 2022-07-22 LAB — TROPONIN I (HIGH SENSITIVITY)
Troponin I (High Sensitivity): 4 ng/L (ref <18)
Troponin I (High Sensitivity): 4 ng/L (ref <18)

## 2022-07-22 LAB — COMPREHENSIVE METABOLIC PANEL
ALT: 10 U/L (ref 0–44)
AST: 13 U/L — ABNORMAL LOW (ref 15–41)
Albumin: 3.1 g/dL — ABNORMAL LOW (ref 3.5–5.0)
Alkaline Phosphatase: 90 U/L (ref 38–126)
Anion gap: 10 (ref 5–15)
BUN: 12 mg/dL (ref 8–23)
CO2: 25 mmol/L (ref 22–32)
Calcium: 8.9 mg/dL (ref 8.9–10.3)
Chloride: 108 mmol/L (ref 98–111)
Creatinine, Ser: 0.81 mg/dL (ref 0.44–1.00)
GFR, Estimated: >60 mL/min (ref >60)
Glucose, Bld: 102 mg/dL — ABNORMAL HIGH (ref 70–99)
Potassium: 3.9 mmol/L (ref 3.5–5.1)
Sodium: 143 mmol/L (ref 135–145)
Total Bilirubin: 0.5 mg/dL (ref 0.3–1.2)
Total Protein: 6.7 g/dL (ref 6.5–8.1)

## 2022-07-22 LAB — PROTIME-INR
INR: 0.9 (ref 0.8–1.2)
Prothrombin Time: 12.5 seconds (ref 11.4–15.2)

## 2022-07-22 LAB — APTT
aPTT: 26 seconds (ref 24–36)
aPTT: 49 seconds — ABNORMAL HIGH (ref 24–36)

## 2022-07-22 LAB — HEPARIN LEVEL (UNFRACTIONATED)
Heparin Unfractionated: 0.12 IU/mL — ABNORMAL LOW (ref 0.30–0.70)
Heparin Unfractionated: 0.2 [IU]/mL — ABNORMAL LOW (ref 0.30–0.70)
Heparin Unfractionated: 0.29 IU/mL — ABNORMAL LOW (ref 0.30–0.70)

## 2022-07-22 MED ORDER — HEPARIN BOLUS VIA INFUSION
1000.0000 [IU] | Freq: Once | INTRAVENOUS | Status: AC
  Administered 2022-07-22: 1000 [IU] via INTRAVENOUS
  Filled 2022-07-22: qty 1000

## 2022-07-22 MED ORDER — ONDANSETRON HCL 4 MG/2ML IJ SOLN
4.0000 mg | Freq: Once | INTRAMUSCULAR | Status: AC
  Administered 2022-07-22: 4 mg via INTRAVENOUS
  Filled 2022-07-22: qty 2

## 2022-07-22 MED ORDER — ACETAMINOPHEN 325 MG PO TABS
650.0000 mg | ORAL_TABLET | Freq: Four times a day (QID) | ORAL | Status: DC | PRN
  Administered 2022-07-26: 650 mg via ORAL
  Filled 2022-07-22: qty 2

## 2022-07-22 MED ORDER — HYDROMORPHONE HCL 1 MG/ML IJ SOLN
1.0000 mg | Freq: Once | INTRAMUSCULAR | Status: AC
  Administered 2022-07-22: 1 mg via INTRAVENOUS
  Filled 2022-07-22: qty 1

## 2022-07-22 MED ORDER — MORPHINE SULFATE (PF) 2 MG/ML IV SOLN
2.0000 mg | INTRAVENOUS | Status: DC | PRN
  Administered 2022-07-22 – 2022-07-26 (×9): 2 mg via INTRAVENOUS
  Filled 2022-07-22 (×9): qty 1

## 2022-07-22 MED ORDER — HEPARIN (PORCINE) 25000 UT/250ML-% IV SOLN
1400.0000 [IU]/h | INTRAVENOUS | Status: DC
  Administered 2022-07-22: 1550 [IU]/h via INTRAVENOUS
  Administered 2022-07-22: 1100 [IU]/h via INTRAVENOUS
  Filled 2022-07-22 (×2): qty 250

## 2022-07-22 MED ORDER — ONDANSETRON HCL 4 MG/2ML IJ SOLN: 4.0000 mg | Freq: Four times a day (QID) | INTRAMUSCULAR | Status: DC | PRN

## 2022-07-22 MED ORDER — HEPARIN BOLUS VIA INFUSION
4000.0000 [IU] | Freq: Once | INTRAVENOUS | Status: AC
  Administered 2022-07-22: 4000 [IU] via INTRAVENOUS
  Filled 2022-07-22: qty 4000

## 2022-07-22 MED ORDER — HEPARIN BOLUS VIA INFUSION
2000.0000 [IU] | Freq: Once | INTRAVENOUS | Status: AC
  Administered 2022-07-22: 2000 [IU] via INTRAVENOUS
  Filled 2022-07-22: qty 2000

## 2022-07-22 MED ORDER — PREGABALIN 50 MG PO CAPS
100.0000 mg | ORAL_CAPSULE | Freq: Three times a day (TID) | ORAL | Status: DC
  Administered 2022-07-22 – 2022-08-01 (×26): 100 mg via ORAL
  Filled 2022-07-22 (×28): qty 2

## 2022-07-22 MED ORDER — CITALOPRAM HYDROBROMIDE 10 MG PO TABS
40.0000 mg | ORAL_TABLET | Freq: Every day | ORAL | Status: DC
  Administered 2022-07-22 – 2022-08-12 (×21): 40 mg via ORAL
  Filled 2022-07-22: qty 2
  Filled 2022-07-22: qty 4
  Filled 2022-07-22 (×5): qty 2
  Filled 2022-07-22: qty 4
  Filled 2022-07-22: qty 2
  Filled 2022-07-22: qty 4
  Filled 2022-07-22 (×5): qty 2
  Filled 2022-07-22 (×2): qty 4
  Filled 2022-07-22: qty 2
  Filled 2022-07-22: qty 4
  Filled 2022-07-22 (×2): qty 2

## 2022-07-22 MED ORDER — METHOCARBAMOL 500 MG PO TABS
500.0000 mg | ORAL_TABLET | Freq: Three times a day (TID) | ORAL | Status: DC | PRN
  Administered 2022-08-03 – 2022-08-07 (×3): 500 mg via ORAL
  Filled 2022-07-22 (×3): qty 1

## 2022-07-22 MED ORDER — ACETAMINOPHEN 650 MG RE SUPP: 650.0000 mg | Freq: Four times a day (QID) | RECTAL | Status: DC | PRN

## 2022-07-22 MED ORDER — ONDANSETRON HCL 4 MG PO TABS: 4.0000 mg | ORAL_TABLET | Freq: Four times a day (QID) | ORAL | Status: DC | PRN

## 2022-07-22 MED ORDER — PANTOPRAZOLE SODIUM 40 MG PO TBEC
40.0000 mg | DELAYED_RELEASE_TABLET | Freq: Every day | ORAL | Status: DC
  Administered 2022-07-22: 40 mg via ORAL
  Filled 2022-07-22: qty 1

## 2022-07-22 MED ORDER — SODIUM CHLORIDE 0.9 % IV BOLUS
1000.0000 mL | Freq: Once | INTRAVENOUS | Status: AC
  Administered 2022-07-22: 1000 mL via INTRAVENOUS

## 2022-07-22 MED ORDER — HEPARIN SODIUM (PORCINE) 5000 UNIT/ML IJ SOLN: 60.0000 [IU]/kg | Freq: Once | INTRAMUSCULAR | Status: DC

## 2022-07-22 MED ORDER — IOHEXOL 350 MG/ML SOLN
125.0000 mL | Freq: Once | INTRAVENOUS | Status: AC | PRN
  Administered 2022-07-22: 125 mL via INTRAVENOUS

## 2022-07-22 MED ORDER — HEPARIN (PORCINE) 25000 UT/250ML-% IV SOLN: 16.0000 [IU]/kg/h | INTRAVENOUS | Status: DC

## 2022-07-22 MED ORDER — ISOSORBIDE MONONITRATE ER 30 MG PO TB24
60.0000 mg | ORAL_TABLET | Freq: Every day | ORAL | Status: DC
  Administered 2022-07-22 – 2022-08-12 (×19): 60 mg via ORAL
  Filled 2022-07-22: qty 1
  Filled 2022-07-22: qty 2
  Filled 2022-07-22: qty 1
  Filled 2022-07-22 (×4): qty 2
  Filled 2022-07-22: qty 1
  Filled 2022-07-22: qty 2
  Filled 2022-07-22 (×2): qty 1
  Filled 2022-07-22 (×2): qty 2
  Filled 2022-07-22: qty 1
  Filled 2022-07-22: qty 2
  Filled 2022-07-22 (×4): qty 1

## 2022-07-22 MED ORDER — ATORVASTATIN CALCIUM 20 MG PO TABS
80.0000 mg | ORAL_TABLET | Freq: Every day | ORAL | Status: DC
  Administered 2022-07-22 – 2022-08-11 (×20): 80 mg via ORAL
  Filled 2022-07-22 (×3): qty 4
  Filled 2022-07-22: qty 1
  Filled 2022-07-22: qty 4
  Filled 2022-07-22 (×3): qty 1
  Filled 2022-07-22: qty 4
  Filled 2022-07-22: qty 1
  Filled 2022-07-22 (×2): qty 4
  Filled 2022-07-22 (×3): qty 1
  Filled 2022-07-22 (×2): qty 4
  Filled 2022-07-22: qty 1
  Filled 2022-07-22: qty 4
  Filled 2022-07-22: qty 1

## 2022-07-22 MED ORDER — MORPHINE SULFATE (PF) 4 MG/ML IV SOLN
4.0000 mg | Freq: Once | INTRAVENOUS | Status: AC
  Administered 2022-07-22: 4 mg via INTRAVENOUS
  Filled 2022-07-22: qty 1

## 2022-07-22 MED ORDER — HYDROCODONE-ACETAMINOPHEN 5-325 MG PO TABS
1.0000 | ORAL_TABLET | ORAL | Status: DC | PRN
  Administered 2022-07-22 (×4): 2 via ORAL
  Administered 2022-07-24: 1 via ORAL
  Administered 2022-07-25: 2 via ORAL
  Administered 2022-07-25: 1 via ORAL
  Administered 2022-07-25 – 2022-07-26 (×3): 2 via ORAL
  Filled 2022-07-22 (×2): qty 2
  Filled 2022-07-22 (×2): qty 1
  Filled 2022-07-22 (×6): qty 2

## 2022-07-22 NOTE — Assessment & Plan Note (Addendum)
Peripheral artery disease s/p right AKA S/p OR initially 10/01 for Left axillary to common femoral artery bypass graft, Prevena wound VAC placed . Post surgery patient had left foot pain and mottled leg was taken to the OR again 10/01, thrombectomy of left axillofemoral bypass graft.  Patient was initially managed with heparin infusion and then started on Eliquis. IV pain medications were discontinued. Pain remain the main complaint. -Add MS Contin -Continue with Norco-Will avoid IV pain medications. -Continue with Eliquis -Continue with aspirin and statin. -PT is recommending SNF-medically stable, TOC to find a placement.

## 2022-07-22 NOTE — Consult Note (Signed)
    Subjective  -   No acute overnight events.  Pain is moderately controlled with narcotics   Physical Exam:  No change in the appearance of the left foot       Assessment/Plan:    Left foot ischemia: We again discussed proceeding with left axillary to femoral bypass graft for limb salvage.  She will remain on heparin.  There is no need to stop the heparin prior to surgery.  She will be n.p.o. after midnight.  [place] Carla [name] 07/22/2022 7:17 PM --  Vitals:   07/22/22 1545 07/22/22 1900  BP: 125/63 (!) 146/72  Pulse: 74 72  Resp: 16 18  Temp:    SpO2: 96% 93%   No intake or output data in the 24 hours ending 07/22/22 1917   Laboratory CBC    Component Value Date/Time   WBC 8.3 07/22/2022 0030   HGB 11.9 (L) 07/22/2022 0030   HGB 13.4 02/12/2015 1137   HCT 38.1 07/22/2022 0030   HCT 40.9 02/12/2015 1137   PLT 237 07/22/2022 0030   PLT 272 02/12/2015 1137    BMET    Component Value Date/Time   NA 143 07/22/2022 0030   NA 140 02/12/2015 1137   K 3.9 07/22/2022 0030   K 2.9 (L) 02/12/2015 1137   CL 108 07/22/2022 0030   CL 107 02/12/2015 1137   CO2 25 07/22/2022 0030   CO2 27 02/12/2015 1137   GLUCOSE 102 (H) 07/22/2022 0030   GLUCOSE 120 (H) 02/12/2015 1137   BUN 12 07/22/2022 0030   BUN 15 02/12/2015 1137   CREATININE 0.81 07/22/2022 0030   CREATININE 0.78 02/12/2015 1137   CALCIUM 8.9 07/22/2022 0030   CALCIUM 9.0 02/12/2015 1137   GFRNONAA >60 07/22/2022 0030   GFRNONAA >60 02/12/2015 1137   GFRAA >60 11/23/2019 1330   GFRAA >60 02/12/2015 1137    [facility] Lab Results  Component Value Date   INR 0.9 07/22/2022   INR 1.3 (H) 05/02/2022   INR 0.9 06/27/2021   No results found for: "PTT"  Antibiotics Anti-infectives (From admission, onward)    None        Carla [name], M.D., [facility] [place] Office: [phone] Pager:  [phone]

## 2022-07-22 NOTE — ED Notes (Signed)
Pt given pillow to prop leg on

## 2022-07-22 NOTE — ED Notes (Signed)
Pt sitting up and eating breakfast- pt given hot cup of coffee

## 2022-07-22 NOTE — Consult Note (Signed)
[facility]  Patient name: Carla [name] MRN: [id number] DOB: 1961/08/27 Sex: female   REQUESTING PROVIDER:    ER   REASON FOR CONSULT:    Ischemic left leg  HISTORY OF PRESENT ILLNESS:   Carla [name] is a 61 y.o. female, who presented to the emergency department tonight with a 1 week history of left leg and foot pain which has gotten worse.  She has had extensive attempts at revascularization in the past.  She has a recent right leg amputation.  She is on Eliquis, her last dose was earlier this evening.  The patient has an extensive vascular history including mesenteric artery stenting.  She also has a large ventral hernia with ulceration that is causing her significant pain.  She has a history of GI bleed.  She is on Eliquis for atrial fibrillation.  She has a history of MI.  She suffers from COPD secondary to chronic tobacco abuse.  She is on a statin for hypercholesterolemia.  She is medically managed for hypertension.  PAST MEDICAL HISTORY    Past Medical History:  Diagnosis Date   Abdominal aortic atherosclerosis ([place])    a. 05/2017 CTA abd/pelvis: significant atherosclerotic dzs of the infrarenal abd Ao w/ some mural thrombus. No aneurysm or dissection.   Acute focal ischemia of small intestine (HCC)    Acute right-sided low back pain with right-sided sciatica 06/13/2017   AKI (acute kidney injury) ([place]) 07/29/2017   Anemia    Anginal pain ([place])    a. 08/2012 Lexiscan MV: EF 54%, non ischemia/infarct.   Anxiety and depression    Baker's cyst of knee, right    a. 07/2016 U/S: 4.1 x 1.4 x 2.9 cystic structure in R poplitetal fossa.   Bell's palsy    CAD (coronary artery disease)    CHF (congestive heart failure) ([place])    a.) TTE 06/20/2017: EF 60-65%, no rwma, G1DD, no source of cardiac emboli.   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 05/2017 CTA Abd/pelvis: apparent thrombus or embolus in prox SMA (70-90%); b. 05/2017 catheter directed tPA, mechanical thrombectomy, and stenting of the SMA.   Essential hypertension with goal blood pressure less than 140/90 01/19/2016   Gastroesophageal reflux disease 01/19/2016   GERD (gastroesophageal reflux disease)    H/O colectomy    History of 2019 novel coronavirus disease (COVID-19) 11/14/2019   History of kidney stones    Hyperlipidemia    Hypertension    Morbid obesity with BMI of 40.0-44.9, adult ([place])    NSTEMI (non-ST elevated myocardial infarction) ([place]) 06/27/2021   a.) high sensitivity troponins trended: 893 --> 1587 --> 1748 --> 868 --> 735 ng/L   Occlusive mesenteric ischemia ([place]) 06/19/2017   Osteoarthritis    PAD (peripheral artery disease) (HCC)    PAF (paroxysmal atrial fibrillation) (HCC)    a.) CHA2DS2-VASc = 6 (sex, CHF, HTN, DVT x2, aortic plaque). b.) rate/rhythm maintained on oral metoprolol tartrate; chronically anticoagulated with standard dose apixaban.   Palpitations    Peripheral edema    Personal history of other malignant neoplasm of skin 06/01/2016   Pneumonia    Pre-diabetes    Primary osteoarthritis of both knees 06/13/2017   SBO (small bowel  obstruction) ([place]) 08/06/2017   Skin cancer of nose    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HCC)    Vitamin D deficiency      FAMILY HISTORY   Family History  Problem Relation Age of Onset   Hypertension Mother    Heart disease Mother    Heart attack Mother    Breast cancer Mother    Hypertension Father    Breast cancer Paternal Aunt     SOCIAL HISTORY:   Social History   Socioeconomic History   Marital status: Single    Spouse name: Not on file   Number of children: Not on file   Years of education: Not on file   Highest education level: Not on file   Occupational History   Occupation: Unemployed  Tobacco Use   Smoking status: Former    Packs/day: 1.00    Types: Cigarettes    Quit date: 2023    Years since quitting: 0.7   Smokeless tobacco: Never  Vaping Use   Vaping Use: Never used  Substance and Sexual Activity   Alcohol use: No   Drug use: No   Sexual activity: Not on file  Other Topics Concern   Not on file  Social History Narrative   Lives in [place] with daughter   Social Determinants of Health   Financial Resource Strain: Not on file  Food Insecurity: Not on file  Transportation Needs: Not on file  Physical Activity: Not on file  Stress: Not on file  Social Connections: Not on file  Intimate Partner Violence: Not on file    ALLERGIES:    Allergies  Allergen Reactions   Gabapentin     Tremors    Bactrim [Sulfamethoxazole-Trimethoprim] Itching    CURRENT MEDICATIONS:    Current Facility-Administered Medications  Medication Dose Route Frequency Provider Last Rate Last Admin   acetaminophen (TYLENOL) tablet 650 mg  650 mg Oral Q6H PRN Athena Masse, MD       Or   acetaminophen (TYLENOL) suppository 650 mg  650 mg Rectal Q6H PRN Athena Masse, MD       atorvastatin (LIPITOR) tablet 80 mg  80 mg Oral QHS Athena Masse, MD       citalopram (CELEXA) tablet 40 mg  40 mg Oral Daily Judd Gaudier V, MD       heparin ADULT infusion 100 units/mL (25000 units/241m)  1,100 Units/hr Intravenous Continuous SPaulette Blanch MD 11 mL/hr at 07/22/22 0138 1,100 Units/hr at 07/22/22 0138   HYDROcodone-acetaminophen (NORCO/VICODIN) 5-325 MG per tablet 1-2 tablet  1-2 tablet Oral Q4H PRN DAthena Masse MD       isosorbide mononitrate (IMDUR) 24 hr tablet 60 mg  60 mg Oral Daily DAthena Masse MD       methocarbamol (ROBAXIN) tablet 500 mg  500 mg Oral Q8H PRN DAthena Masse MD       morphine (PF) 2 MG/ML injection 2 mg  2 mg Intravenous Q2H PRN DAthena Masse MD       ondansetron ([facility] tablet 4 mg  4  mg Oral Q6H PRN DAthena Masse MD       Or   ondansetron ([facility] injection 4 mg  4 mg Intravenous Q6H PRN DAthena Masse MD       pregabalin (LYRICA) capsule 100 mg  100 mg Oral TID DAthena Masse MD       Current Outpatient Medications  Medication Sig Dispense Refill   Amino  Acids-Protein Hydrolys (PRO-STAT) LIQD Take 30 mLs by mouth in the morning and at bedtime.     apixaban (ELIQUIS) 5 MG TABS tablet Take 1 tablet (5 mg total) by mouth 2 (two) times daily. 60 tablet 11   aspirin EC 81 MG tablet Take 81 mg by mouth daily. Swallow whole.     atorvastatin (LIPITOR) 80 MG tablet Take 80 mg by mouth at bedtime.     citalopram (CELEXA) 40 MG tablet Take 1 tablet (40 mg total) by mouth daily. 30 tablet 2   COMBIVENT RESPIMAT 20-100 MCG/ACT AERS respimat      feeding supplement (ENSURE ENLIVE / ENSURE PLUS) LIQD Take 237 mLs by mouth 2 (two) times daily between meals. 237 mL 12   ferrous sulfate 325 (65 FE) MG tablet Take 325 mg by mouth daily.     furosemide (LASIX) 40 MG tablet Take 0.5 tablets (20 mg total) by mouth daily as needed. For leg swelling 20 tablet 0   isosorbide mononitrate (IMDUR) 60 MG 24 hr tablet Take 60 mg by mouth daily.     methocarbamol (ROBAXIN) 500 MG tablet Take 1 tablet (500 mg total) by mouth every 8 (eight) hours as needed for muscle spasms. 30 tablet 0   Multiple Vitamin (MULTIVITAMIN WITH MINERALS) TABS tablet Take 1 tablet by mouth daily. 30 tablet 0   omeprazole (PRILOSEC) 20 MG capsule Take 20 mg by mouth 2 (two) times daily before a meal.     ondansetron (ZOFRAN) 4 MG tablet Take 4 mg by mouth every 8 (eight) hours as needed for nausea or vomiting.     oxyCODONE-acetaminophen (PERCOCET/ROXICET) 5-325 MG tablet Take 1 tablet by mouth every 6 (six) hours as needed for moderate pain. 40 tablet 0   pregabalin (LYRICA) 100 MG capsule Take 1 capsule (100 mg total) by mouth 3 (three) times daily. 90 capsule 3    REVIEW OF SYSTEMS:   '[X]'$  denotes positive  finding, '[ ]'$  denotes negative finding Cardiac  Comments:  Chest pain or chest pressure:    Shortness of breath upon exertion:    Short of breath when lying flat:    Irregular heart rhythm:        Vascular    Pain in calf, thigh, or hip brought on by ambulation:    Pain in feet at night that wakes you up from your sleep:  x   Blood clot in your veins:    Leg swelling:         Pulmonary    Oxygen at home:    Productive cough:     Wheezing:         Neurologic    Sudden weakness in arms or legs:     Sudden numbness in arms or legs:     Sudden onset of difficulty speaking or slurred speech:    Temporary loss of vision in one eye:     Problems with dizziness:         Gastrointestinal    Blood in stool:      Vomited blood:         Genitourinary    Burning when urinating:     Blood in urine:        Psychiatric    Major depression:         Hematologic    Bleeding problems:    Problems with blood clotting too easily:        Skin    Rashes or ulcers:  Constitutional    Fever or chills:     PHYSICAL EXAM:   Vitals:   07/22/22 0130 07/22/22 0139 07/22/22 0200 07/22/22 0230  BP:  137/75 (!) 142/93 (!) 144/84  Pulse: 86 94 88 (!) 104  Resp: '16 18 17 14  '$ SpO2: 99% 100% 100% 97%  Weight:      Height:        GENERAL: The patient is a well-nourished female, in no acute distress. The vital signs are documented above. CARDIAC: There is a regular rate and rhythm.  VASCULAR: Nonpalpable peripheral pulses.  She has a palpable left radial pulse PULMONARY: Nonlabored respirations ABDOMEN: Soft and non-tender with large right-sided ventral hernia with ulceration MUSCULOSKELETAL: Right leg amputation NEUROLOGIC: Chronic loss of sensation to the left foot.  She does have motor function present. SKIN: Slight discoloration in her left toes  PSYCHIATRIC: The patient has a normal affect.  STUDIES:   I have reviewed her CT scan with the following findings: VASCULAR    Occlusion of the aorto bi-iliac stents with distal reconstitution of the external iliac/common femoral arteries.   Patent three-vessel runoff of the left lower extremity to the level of the calf where there is non opacification of the distal lower extremity arteries. This may be due to slow flow however occlusion is not excluded.   Occluded SMA stent at the origin with distal reconstitution.   NON-VASCULAR   Right above-the-knee amputation. At the site of amputation there is a fluid collection concerning for abscess.   Large ventral abdominal wall hernia containing nonobstructive loops of bowel.  ASSESSMENT and PLAN   Ischemic left lower extremity: I discussed with the patient and her daughter who was via telephone that this is an extremely complicated situation.  She clearly has a ischemic left foot however this has been going on for approximately 1 week.  Her CT scan shows that her aortic iliac stents are occluded with large thrombus burden.  She does reconstitute her distal external iliac and common femoral vessels.  The visualized portion of the superficial femoral and profundofemoral artery are patent.  I discussed that she is at high risk for amputation.  I think she has limited options for revascularization.  I think the best option at this time is a left axillary femoral bypass graft.  I discussed the limited durability of this operation.  Since she last took her Eliquis a few hours ago, I would like to see if we could have this wear off to minimize her risk for hematoma.  I discussed proceeding with surgery in 24 hours on Sunday morning.  She needs to be admitted to the hospital and placed on IV heparin.  She will need to be n.p.o. after midnight tonight.  If she has further decline in her foot, I would proceed sooner.   Leia Alf, MD, FACS [facility] [phone] Pager [phone]

## 2022-07-22 NOTE — ED Triage Notes (Signed)
Pt from home to ED for L lower leg and L foot pain.  Pt Hx of blood clots, R AKA in June. L toes cyanotic.

## 2022-07-22 NOTE — ED Provider Notes (Signed)
[facility] Provider Note    Event Date/Time   First MD Initiated Contact with Patient 07/22/22 0016     (approximate)   History   Left leg pain   HPI  Carla [name] is a 61 y.o. female brought to the ED via EMS from home with a chief complaint of left leg pain.  Patient has a history significant for PAD status post right AKA this summer.  Currently on Eliquis.  Has left common iliac artery stent.  Has been having pain to her left lower limb x1 week, trying to schedule an appointment with her vascular surgeon Dr. Lucky Cowboy.  Presents tonight secondary to severe pain.  Denies chest pain, shortness of breath, abdominal pain, nausea, vomiting or dizziness.  03/24/2022   1.  Ultrasound guidance for vascular access bilateral femoral arteries             2.  Catheter placement into aorta from bilateral femoral approaches             3.  Aortogram and bilateral ileofemoral angiograms             4.  Percutaneous transluminal angioplasty of left common iliac artery with 7 mm diameter angioplasty balloon             5.  Mechanical thrombectomy to the right external and common iliac arteries as well as the distal aorta with the penumbra CAT 7D catheter             6.  Percutaneous transluminal angioplasty of the right common iliac artery with 8 mm diameter angioplasty balloon             7.  Lifestream stent placement to the right common iliac artery with 9 mm diameter by 58 mm length lifestream stent             8.  StarClose closure device bilateral femoral arteries  Past Medical History   Past Medical History:  Diagnosis Date   Abdominal aortic atherosclerosis ([place])    a. 05/2017 CTA abd/pelvis: significant atherosclerotic dzs of the infrarenal abd Ao w/ some mural thrombus. No aneurysm or dissection.   Acute focal ischemia of small intestine (HCC)    Acute right-sided low back pain with right-sided sciatica 06/13/2017   AKI (acute kidney injury) ([place]) 07/29/2017    Anemia    Anginal pain ([place])    a. 08/2012 Lexiscan MV: EF 54%, non ischemia/infarct.   Anxiety and depression    Baker's cyst of knee, right    a. 07/2016 U/S: 4.1 x 1.4 x 2.9 cystic structure in R poplitetal fossa.   Bell's palsy    CAD (coronary artery disease)    CHF (congestive heart failure) ([place])    a.) TTE 06/20/2017: EF 60-65%, no rwma, G1DD, no source of cardiac emboli.   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 05/2017 CTA Abd/pelvis: apparent thrombus or embolus in prox SMA (70-90%); b. 05/2017 catheter directed tPA, mechanical thrombectomy, and stenting of the SMA.   Essential hypertension with goal blood pressure less than 140/90 01/19/2016   Gastroesophageal reflux disease 01/19/2016   GERD (gastroesophageal reflux disease)    H/O colectomy    History of 2019 novel coronavirus  disease (COVID-19) 11/14/2019   History of kidney stones    Hyperlipidemia    Hypertension    Morbid obesity with BMI of 40.0-44.9, adult (HCC)    NSTEMI (non-ST elevated myocardial infarction) ([place]) 06/27/2021   a.) high sensitivity troponins trended: 893 --> 1587 --> 1748 --> 868 --> 735 ng/L   Occlusive mesenteric ischemia (HCC) 06/19/2017   Osteoarthritis    PAD (peripheral artery disease) (HCC)    PAF (paroxysmal atrial fibrillation) (HCC)    a.) CHA2DS2-VASc = 6 (sex, CHF, HTN, DVT x2, aortic plaque). b.) rate/rhythm maintained on oral metoprolol tartrate; chronically anticoagulated with standard dose apixaban.   Palpitations    Peripheral edema    Personal history of other malignant neoplasm of skin 06/01/2016   Pneumonia    Pre-diabetes    Primary osteoarthritis of both knees 06/13/2017   SBO (small bowel obstruction) ([place]) 08/06/2017   Skin cancer of nose    Superior mesenteric  artery thrombosis (HCC)    Vitamin D deficiency      Active Problem List   Patient Active Problem List   Diagnosis Date Noted   Wound infection 05/02/2022   Acute respiratory failure with hypoxia ([place]) 05/02/2022   Lactic acidosis 05/02/2022   Hypoalbuminemia due to protein-calorie malnutrition ([place]) 05/02/2022   Atherosclerosis of artery of extremity with rest pain ([place]) 04/06/2022   Rectal bleeding 03/21/2022   Hypokalemia 02/03/2022   Chronic anticoagulation    Anxiety and depression    Palpitations    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[place]) 12/03/2021   Critical limb ischemia of left lower extremity ([place]) 11/30/2021   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[place]) 11/11/2021   Arterial occlusion 11/11/2021   CAD (coronary artery disease) 07/07/2021   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[place]) 06/27/2021   Ischemic pain of right foot 06/27/2021   PAD (peripheral artery disease) ([place]) 05/03/2021   Ischemia of extremity 01/24/2021   Tobacco abuse counseling 10/26/2020   Smoker 10/26/2020   Chronic diastolic CHF (congestive heart failure) ([place]) 10/06/2020   Neuropathic pain 08/19/2020   Bursitis of right shoulder 12/01/2019   Chronic pain 12/01/2019   Primary osteoarthritis involving multiple joints 12/01/2019   Ischemia of left lower extremity 11/17/2019   Carpal tunnel syndrome, right 11/10/2019   Health care maintenance 03/26/2019   Disorder of skeletal system 03/26/2019   Problems influencing health status 03/26/2019   Chronic low back pain (Secondary area of Pain) (Bilateral) w/o sciatica 03/26/2019   Rib pain on left side 03/25/2019   Aortic atherosclerosis ([place]) 09/12/2018   COPD (chronic obstructive pulmonary disease) ([place]) 09/12/2018   Moderate episode of recurrent major depressive disorder ([place]) 09/12/2018   Lumbar spondylosis 09/06/2018   History of total knee replacement (Right) 07/11/2018   Ventral hernia with obstruction and without gangrene 07/02/2018   PAF (paroxysmal atrial  fibrillation) ([place]) 06/15/2018   Accelerated hypertension 04/08/2018   Acute encephalopathy 04/08/2018   DDD (degenerative disc disease), lumbosacral 09/25/2017   Osteoarthritis of knee 09/25/2017   Adjustment disorder with anxiety 08/15/2017   Chronic idiopathic constipation    Protein-calorie malnutrition, severe 08/08/2017   SBO (small bowel obstruction) ([place]) 08/06/2017   Chronic mesenteric ischemia (HCC)    AKI (acute kidney injury) ([place]) 07/29/2017   Hypotension 07/29/2017   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HCC)    Abdominal pain    Occlusive mesenteric ischemia ([place]) 06/19/2017   Acute focal ischemia of small intestine (HCC)    Acute right-sided low back pain with right-sided sciatica 06/13/2017   Osteoarthritis of knee (  Bilateral) 06/13/2017   Chronic pain syndrome 09/13/2016   Morbid obesity with BMI of 40.0-44.9, adult ([place]) 07/16/2016   Personal history of other malignant neoplasm of skin 06/01/2016   Chronic knee pain (Primary Area of Pain) (Bilateral) 01/19/2016   Essential hypertension 01/19/2016   GERD (gastroesophageal reflux disease) 01/19/2016   Mixed hyperlipidemia 01/19/2016   Prediabetes 01/19/2016   Recurrent major depressive disorder ([place]) 01/19/2016   Urinary tract infection 01/09/2014   Chronic pelvic pain in female 11/14/2013   Mixed stress and urge urinary incontinence 11/14/2013   Chest pain 08/06/2012     Past Surgical History   Past Surgical History:  Procedure Laterality Date   AMPUTATION Right 04/06/2022   Procedure: AMPUTATION ABOVE KNEE;  Surgeon: Algernon Huxley, MD;  Location: [facility];  Service: Vascular;  Laterality: Right;   AMPUTATION TOE Left 12/09/2019   Procedure: AMPUTATION TOE MPJ left;  Surgeon: Caroline More, DPM;  Location: [facility];  Service: Podiatry;  Laterality: Left;   APPENDECTOMY     APPLICATION OF WOUND VAC  12/01/2021   Procedure: APPLICATION OF WOUND VAC;  Surgeon: Algernon Huxley, MD;  Location: [facility];  Service:  Vascular;;   APPLICATION OF WOUND VAC Right 05/03/2022   Procedure: APPLICATION OF WOUND VAC;  Surgeon: Algernon Huxley, MD;  Location: [facility];  Service: Vascular;  Laterality: Right;   CHOLECYSTECTOMY     COLON SURGERY     COLONOSCOPY WITH PROPOFOL N/A 03/23/2022   Procedure: COLONOSCOPY WITH PROPOFOL;  Surgeon: Jonathon Bellows, MD;  Location[facility] END[facility];  Service: Gastroenterology;  Laterality: N/A;  rectal bleed   EMBOLECTOMY OR THROMBECTOMY, WITH OR WITHOUT CATHETER; FEMOROPOPLITEAL, AORTOILIAC ARTERY, BY LEG INCISION N/A 06/21/2020   Left - Decomp Fasciotomy Leg; Ant &/Or Lat Compart Only; Midline - Angiography, Visceral, Selective Or Supraselective (With Or Without Flush Aortogram); Left - Revascularize, Endovasc, Open/Percut, Iliac Artery, Unilat, Initial Vessel; W/Translum Stent, W/Angioplasty; Right - Revascularize, Endovasc, Open/Percut, Iliac Artery, Ea Add`L Ipsilateral; W/Translumin Stent, W/Angioplasty; Location [facility];   ENDARTERECTOMY FEMORAL Left 12/01/2021   Procedure: ENDARTERECTOMY FEMORAL;  Surgeon: Algernon Huxley, MD;  Location: [facility];  Service: Vascular;  Laterality: Left;   ENDOVASCULAR REPAIR/STENT GRAFT Left 12/01/2021   Procedure: ENDOVASCULAR REPAIR/STENT GRAFT;  Surgeon: Algernon Huxley, MD;  Location: [place] [facility];  Service: Cardiovascular;  Laterality: Left;   INCISION AND DRAINAGE OF WOUND Right 05/03/2022   Procedure: IRRIGATION AND DEBRIDEMENT RIGHT STUMP;  Surgeon: Algernon Huxley, MD;  Location: [facility];  Service: Vascular;  Laterality: Right;   LAPAROTOMY N/A 08/08/2017   Procedure: EXPLORATORY LAPAROTOMY POSSIBLE BOWEL RESECTION;  Surgeon: Jules Husbands, MD;  Location: [facility];  Service: General;  Laterality: N/A;   LEFT HEART CATH AND CORONARY ANGIOGRAPHY N/A 05/17/2021   Procedure: LEFT HEART CATH AND CORONARY ANGIOGRAPHY;  Surgeon: Yolonda Kida, MD;  Location: [place] [facility];  Service: Cardiovascular;  Laterality: N/A;   LOWER EXTREMITY ANGIOGRAPHY  Left 11/17/2019   Procedure: LOWER EXTREMITY ANGIOGRAPHY;  Surgeon: Algernon Huxley, MD;  Location: [place] [facility];  Service: Cardiovascular;  Laterality: Left;   LOWER EXTREMITY ANGIOGRAPHY Right 12/13/2020   Procedure: LOWER EXTREMITY ANGIOGRAPHY;  Surgeon: Algernon Huxley, MD;  Location: [place] [facility];  Service: Cardiovascular;  Laterality: Right;   LOWER EXTREMITY ANGIOGRAPHY Right 01/10/2021   Procedure: LOWER EXTREMITY ANGIOGRAPHY;  Surgeon: Algernon Huxley, MD;  Location: [place] [facility];  Service: Cardiovascular;  Laterality: Right;   LOWER EXTREMITY  ANGIOGRAPHY Right 01/24/2021   Procedure: LOWER EXTREMITY ANGIOGRAPHY;  Surgeon: Algernon Huxley, MD;  Location: [place] [facility];  Service: Cardiovascular;  Laterality: Right;   LOWER EXTREMITY ANGIOGRAPHY Bilateral 06/28/2021   Procedure: Lower Extremity Angiography;  Surgeon: Algernon Huxley, MD;  Location: [place] [facility];  Service: Cardiovascular;  Laterality: Bilateral;   LOWER EXTREMITY ANGIOGRAPHY Right 11/28/2021   Procedure: LOWER EXTREMITY ANGIOGRAPHY;  Surgeon: Algernon Huxley, MD;  Location: [place] [facility];  Service: Cardiovascular;  Laterality: Right;   LOWER EXTREMITY ANGIOGRAPHY Left 11/30/2021   Procedure: Lower Extremity Angiography;  Surgeon: Algernon Huxley, MD;  Location: [place] [facility];  Service: Cardiovascular;  Laterality: Left;   LOWER EXTREMITY ANGIOGRAPHY Right 02/22/2022   Procedure: Lower Extremity Angiography;  Surgeon: Algernon Huxley, MD;  Location: [place] [facility];  Service: Cardiovascular;  Laterality: Right;   LOWER EXTREMITY ANGIOGRAPHY Right 03/13/2022   Procedure: Lower Extremity Angiography;  Surgeon: Algernon Huxley, MD;  Location: [place] [facility];  Service: Cardiovascular;  Laterality: Right;   LOWER EXTREMITY ANGIOGRAPHY Right 03/24/2022   Procedure: Lower Extremity Angiography;  Surgeon: Algernon Huxley, MD;  Location: [place] [facility];  Service: Cardiovascular;  Laterality: Right;    LOWER EXTREMITY INTERVENTION N/A 11/19/2019   Procedure: LOWER EXTREMITY INTERVENTION;  Surgeon: Algernon Huxley, MD;  Location: [place] [facility];  Service: Cardiovascular;  Laterality: N/A;   LOWER EXTREMITY INTERVENTION Bilateral 06/29/2021   Procedure: LOWER EXTREMITY INTERVENTION;  Surgeon: Algernon Huxley, MD;  Location: [place] [facility];  Service: Cardiovascular;  Laterality: Bilateral;   TEE WITHOUT CARDIOVERSION N/A 06/22/2017   Procedure: TRANSESOPHAGEAL ECHOCARDIOGRAM (TEE);  Surgeon: Wellington Hampshire, MD;  Location: [facility];  Service: Cardiovascular;  Laterality: N/A;   TOTAL KNEE ARTHROPLASTY Right 07/11/2018   Procedure: TOTAL KNEE ARTHROPLASTY;  Surgeon: Corky Mull, MD;  Location: [facility];  Service: Orthopedics;  Laterality: Right;   VAGINAL HYSTERECTOMY     VISCERAL ARTERY INTERVENTION N/A 06/20/2017   Procedure: Visceral Artery Intervention, possible aortic thrombectomy;  Surgeon: Algernon Huxley, MD;  Location: [place] [facility];  Service: Cardiovascular;  Laterality: N/A;   VISCERAL ARTERY INTERVENTION N/A 01/28/2018   Procedure: VISCERAL ARTERY INTERVENTION;  Surgeon: Algernon Huxley, MD;  Location: [place] [facility];  Service: Cardiovascular;  Laterality: N/A;     Home Medications   Prior to Admission medications   Medication Sig Start Date End Date Taking? Authorizing Provider  amLODipine (NORVASC) 5 MG tablet Take 5 mg by mouth daily. 07/05/22  Yes [provider]  apixaban (ELIQUIS) 5 MG TABS tablet Take 1 tablet (5 mg total) by mouth 2 (two) times daily. 01/25/21  Yes Stegmayer, Janalyn Harder, PA-C  aspirin EC 81 MG tablet Take 81 mg by mouth daily. Swallow whole.   Yes [provider]  atorvastatin (LIPITOR) 80 MG tablet Take 80 mg by mouth at bedtime.   Yes [provider]  citalopram (CELEXA) 40 MG tablet Take 1 tablet (40 mg total) by mouth daily. 02/04/22 07/22/22 Yes Enzo Bi, MD  COMBIVENT RESPIMAT 20-100 MCG/ACT AERS respimat  03/27/22   Yes [provider]  esomeprazole (NEXIUM) 20 MG capsule Take 20 mg by mouth daily. 07/06/22  Yes [provider]  ferrous sulfate 325 (65 FE) MG tablet Take 325 mg by mouth daily.   Yes [provider]  furosemide (LASIX) 40 MG tablet Take 60 mg by mouth daily. 07/05/22  Yes  [provider]  isosorbide mononitrate (IMDUR) 60 MG 24 hr tablet Take 60 mg by mouth daily. 01/23/22  Yes [provider]  metoprolol tartrate (LOPRESSOR) 50 MG tablet Take 50 mg by mouth 2 (two) times daily. 07/07/22  Yes [provider]  Multiple Vitamin (MULTIVITAMIN WITH MINERALS) TABS tablet Take 1 tablet by mouth daily. 03/25/22  Yes Fritzi Mandes, MD  omeprazole (PRILOSEC) 20 MG capsule Take 20 mg by mouth 2 (two) times daily before a meal.   Yes [provider]  ondansetron (ZOFRAN) 4 MG tablet Take 4 mg by mouth every 8 (eight) hours as needed for nausea or vomiting.   Yes [provider]  oxyCODONE-acetaminophen (PERCOCET/ROXICET) 5-325 MG tablet Take 1 tablet by mouth every 6 (six) hours as needed for moderate pain. 07/22/22  Yes Kris Hartmann, NP  pregabalin (LYRICA) 100 MG capsule Take 1 capsule (100 mg total) by mouth 3 (three) times daily. 05/08/22  Yes Kris Hartmann, NP  Amino Acids-Protein Hydrolys (PRO-STAT) LIQD Take 30 mLs by mouth in the morning and at bedtime. Patient not taking: Reported on 07/22/2022    [provider]  feeding supplement (ENSURE ENLIVE / ENSURE PLUS) LIQD Take 237 mLs by mouth 2 (two) times daily between meals. 05/06/22   Fritzi Mandes, MD  methocarbamol (ROBAXIN) 500 MG tablet Take 1 tablet (500 mg total) by mouth every 8 (eight) hours as needed for muscle spasms. Patient not taking: Reported on 07/22/2022 04/12/22   Kris Hartmann, NP     Allergies  Gabapentin and Bactrim [sulfamethoxazole-trimethoprim]   Family History   Family History  Problem Relation Age of Onset   Hypertension Mother    Heart  disease Mother    Heart attack Mother    Breast cancer Mother    Hypertension Father    Breast cancer Paternal Aunt      Physical Exam  Triage Vital Signs: ED Triage Vitals  Enc Vitals Group     BP      Pulse      Resp      Temp      Temp src      SpO2      Weight      Height      Head Circumference      Peak Flow      Pain Score      Pain Loc      Pain Edu?      Excl. in [place]?     Updated Vital Signs: BP 101/61   Pulse 83   Resp 13   Ht '5\' 2"'$  (1.575 m)   Wt 80.7 kg   SpO2 95%   BMI 32.56 kg/m    General: Awake, moderate distress.  CV:  RRR.  Good peripheral perfusion.  Resp:  Normal effort.  CTAB. Abd:  Nontender.  No distention.  Large ventral hernia noted with chronic ulceration. Other:  Right AKA noted with bandage to stump.  Distal LLE pale and cool to the touch.  Unable to palpate or obtain distal pulses via Doppler.  Delayed capillary refill.   ED Results / Procedures / Treatments  Labs (all labs ordered are listed, but only abnormal results are displayed) Labs Reviewed  CBC WITH DIFFERENTIAL/PLATELET - Abnormal; Notable for the following components:      Result Value   Hemoglobin 11.9 (*)    RDW 16.9 (*)    All other components within normal limits  COMPREHENSIVE METABOLIC PANEL -  Abnormal; Notable for the following components:   Glucose, Bld 102 (*)    Albumin 3.1 (*)    AST 13 (*)    All other components within normal limits  HEPARIN LEVEL (UNFRACTIONATED) - Abnormal; Notable for the following components:   Heparin Unfractionated 0.12 (*)    All other components within normal limits  PROTIME-INR  APTT  HEPARIN LEVEL (UNFRACTIONATED)  APTT  TROPONIN I (HIGH SENSITIVITY)  TROPONIN I (HIGH SENSITIVITY)     EKG  ED ECG REPORT I, Aneliese Beaudry J, the attending physician, personally viewed and interpreted this ECG.   Date: 07/22/2022  EKG Time: 0120  Rate: 83  Rhythm: normal sinus rhythm  Axis: Normal  Intervals:none  ST&T Change:  Nonspecific    RADIOLOGY I have independently visualized and interpreted patient's CTA as well discussed the images with the radiologist:  CTA: Occlusion of aortobiiliac stents as well as SMA stent; large ventral hernia obtain nonobstructive loops of bowel; right AKA site of amputation fluid collection concerning for abscess  Official radiology report(s): CT ANGIO AO+BIFEM W & OR WO CONTRAST  Result Date: 07/22/2022 CLINICAL DATA:  Arterial embolism lower extremity EXAM: CT ANGIOGRAPHY OF ABDOMINAL AORTA WITH ILIOFEMORAL RUNOFF TECHNIQUE: Multidetector CT imaging of the abdomen, pelvis and lower extremities was performed using the standard protocol during bolus administration of intravenous contrast. Multiplanar CT image reconstructions and MIPs were obtained to evaluate the vascular anatomy. RADIATION DOSE REDUCTION: This exam was performed according to the departmental dose-optimization program which includes automated exposure control, adjustment of the mA and/or kV according to patient size and/or use of iterative reconstruction technique. CONTRAST:  16m OMNIPAQUE IOHEXOL 350 MG/ML SOLN COMPARISON:  None Available. CT abdomen and pelvis 03/21/2022 FINDINGS: VASCULAR Aorta: Aorto bi iliac stent is occluded. The aorta is otherwise normal in caliber. Celiac: Patent without dissection or aneurysm. SMA: The SMA stent is occluded at the origin. With distal reconstitution. Renals: Patent bilateral renal arteries without aneurysm or dissection. IMA: Not visualized and likely occluded. RIGHT Lower Extremity Inflow: The right common femoral artery stent is occluded with thrombus extending into the right external iliac artery. The right internal iliac artery is likely occluded at its origin with distal reconstitution. Reconstitution of the distal external iliac artery. Outflow: The common femoral, profunda femoral, and superficial femoral arteries are patent and likely supplied from collateral flow. Runoff:  Not applicable.  Right above the knee amputation. LEFT Lower Extremity Inflow: The common iliac stent external iliac, and proximal internal iliac are occluded. Distal reconstitution of the internal iliac. Outflow: Reconstitution of the common femoral artery. The common femoral, profunda femoral, superficial femoral, and popliteal arteries are patent. Runoff: Three-vessel runoff is patent to a 12-13 cm above the ankle in the calf. At this point the vessels are nonopacified possibly due to slow flow though occlusion is not excluded. Veins: No obvious venous abnormality within the limitations of this arterial phase study. Review of the MIP images confirms the above findings. NON-VASCULAR Lower chest: No acute abnormality. Hepatobiliary: Cholecystectomy. No suspicious focal hepatic lesion. No biliary dilation. Pancreas: Unremarkable. Spleen: Unremarkable. Adrenals/Urinary Tract: Adrenal glands are unremarkable. Kidneys are normal, without renal calculi, focal lesion, or hydronephrosis. Bladder is unremarkable. Stomach/Bowel: Distended stomach. Normal caliber large and small bowel. Colonic diverticulosis without diverticulitis. Herniation of small and large bowel into the large ventral abdominal wall hernia. No evidence of obstruction or strangulation. Bowel anastomosis is present within the hernia sac. Lymphatic: No suspicious adenopathy. Reproductive: Hysterectomy.  No adnexal masses. Other: No free  intraperitoneal fluid or gas. Musculoskeletal: Within the distal right thigh at the site of amputation there is a 5.1 x 4.0 cm fluid collection with peripheral enhancement. Edema within the left foot. No acute osseous abnormality. 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 Patent three-vessel runoff of the left lower extremity to the level of the calf where there is non opacification of the distal lower extremity arteries. This may be  due to slow flow however occlusion is not excluded. Occluded SMA stent at the origin with distal reconstitution. NON-VASCULAR Right above-the-knee amputation. At the site of amputation there is a fluid collection concerning for abscess. Large ventral abdominal wall hernia containing nonobstructive loops of bowel. Emergent results were called by telephone at the time of interpretation on 07/22/2022 at 1:14 am to provider Chayce Rullo , who verbally acknowledged these results. Electronically Signed   By: Placido Sou M.D.   On: 07/22/2022 01:27     PROCEDURES:  Critical Care performed: Yes, see critical care procedure note(s)  CRITICAL CARE Performed by: Paulette Blanch   Total critical care time: 45 minutes  Critical care time was exclusive of separately billable procedures and treating other patients.  Critical care was necessary to treat or prevent imminent or life-threatening deterioration.  Critical care was time spent personally by me on the following activities: development of treatment plan with patient and/or surrogate as well as nursing, discussions with consultants, evaluation of patient's response to treatment, examination of patient, obtaining history from patient or surrogate, ordering and performing treatments and interventions, ordering and review of laboratory studies, ordering and review of radiographic studies, pulse oximetry and re-evaluation of patient's condition.   Procedures   MEDICATIONS ORDERED IN ED: Medications  heparin ADULT infusion 100 units/mL (25000 units/247m) (1,100 Units/hr Intravenous New Bag/Given 07/22/22 0138)  atorvastatin (LIPITOR) tablet 80 mg (has no administration in time range)  isosorbide mononitrate (IMDUR) 24 hr tablet 60 mg (has no administration in time range)  citalopram (CELEXA) tablet 40 mg (has no administration in time range)  pregabalin (LYRICA) capsule 100 mg (has no administration in time range)  methocarbamol (ROBAXIN) tablet 500 mg  (has no administration in time range)  acetaminophen (TYLENOL) tablet 650 mg (has no administration in time range)    Or  acetaminophen (TYLENOL) suppository 650 mg (has no administration in time range)  ondansetron (ZOFRAN) tablet 4 mg (has no administration in time range)    Or  ondansetron (ZOFRAN) injection 4 mg (has no administration in time range)  HYDROcodone-acetaminophen (NORCO/VICODIN) 5-325 MG per tablet 1-2 tablet (2 tablets Oral Given 07/22/22 0420)  morphine (PF) 2 MG/ML injection 2 mg (has no administration in time range)  sodium chloride 0.9 % bolus 1,000 mL (0 mLs Intravenous Stopped 07/22/22 0243)  morphine (PF) 4 MG/ML injection 4 mg (4 mg Intravenous Given 07/22/22 0039)  ondansetron (ZOFRAN) injection 4 mg (4 mg Intravenous Given 07/22/22 0040)  iohexol (OMNIPAQUE) 350 MG/ML injection 125 mL (125 mLs Intravenous Contrast Given 07/22/22 0047)  heparin bolus via infusion 4,000 Units (4,000 Units Intravenous Bolus from Bag 07/22/22 0138)  HYDROmorphone (DILAUDID) injection 1 mg (1 mg Intravenous Given 07/22/22 0219)     IMPRESSION / MDM / ASSESSMENT AND PLAN / ED COURSE  I reviewed the triage vital signs and the nursing notes.  61 year old female presenting with painful left leg.  Differential diagnosis includes but is not limited to ischemic limb, claudication pain, PAD, metabolic, infectious etiologies, etc.  I have personally reviewed patient's records and note her hospitalization from 03/24/2022.  Patient's presentation is most consistent with acute presentation with potential threat to life or bodily function.  The patient is on the cardiac monitor to evaluate for evidence of arrhythmia and/or significant heart rate changes.  Given patient's complex history of PAD with right AKA and her presenting complaints and clinical exam, I am extremely concerned for ischemic leg.  Will initiate heparin bolus with drip, obtain urgent CTA and discuss with  vascular surgery.  Clinical Course as of 07/22/22 9702  Sat Jul 22, 2022  0020 Call placed to vascular surgery. [JS]  0025 Spoke with Dr. Trula Slade on-call for vascular surgery.  Patient is going for CTA now. [JS]  0118 Spoke with radiology: Looks like stent is occluded.  I have relayed this information to vascular surgeon. [JS]  0145 Dr. Trula Slade has evaluated patient at bedside.  Given that she has taken her most recent Eliquis at 8 PM, he has requested hospitalist services for admission for Eliquis washout.  Probably to OR on Sunday.  Have consulted hospitalist services for evaluation and admission. [JS]    Clinical Course User Index [JS] Paulette Blanch, MD     FINAL CLINICAL IMPRESSION(S) / ED DIAGNOSES   Final diagnoses:  Ischemia of extremity  PAD (peripheral artery disease) ([place])     Rx / DC Orders   ED Discharge Orders     None        Note:  This document was prepared using Dragon voice recognition software and may include unintentional dictation errors.   Paulette Blanch, MD 07/22/22 [phone]

## 2022-07-22 NOTE — ED Notes (Signed)
Pt given phone to use 

## 2022-07-22 NOTE — Assessment & Plan Note (Signed)
Clinically compensated Continue metoprolol and Imdur

## 2022-07-22 NOTE — Progress Notes (Signed)
This is a no charge noticed patient was admitted this AM.  Patient seen and examined.  H&P reviewed.   : Carla [name] is a 61 y.o. female with medical history significant for peripheral arterial disease s/p right AKA, chronic diastolic dysfunction CHF, anxiety and depression, COPD, paroxysmal A-fib on chronic anticoagulation therapy, who presents to the ED with left leg pain for the past 1 week but has been progressively worsening.  She denies chest pain, shortness of breath, palpitations, nausea vomiting or dizziness. ED course and data review: Vitals within normal limits.  Labs for the most part unremarkable and at baseline.  EKG, personally viewed and interpreted with sinus rhythm at 83 with no acute ST-T wave changes CT angio showing the following: IMPRESSION: VASCULAR   Occlusion of the aorto bi-iliac stents with distal reconstitution of the external iliac/common femoral arteries.   Patent three-vessel runoff of the left lower extremity to the level of the calf where there is non opacification of the distal lower extremity arteries. This may be due to slow flow however occlusion is not excluded.   Occluded SMA stent at the origin with distal reconstitution.   NON-VASCULAR   Right above-the-knee amputation. At the site of amputation there is a fluid collection concerning for abscess.   Large ventral abdominal wall hernia containing nonobstructive loops of bowel.     The patient was evaluated by vascular surgeon, Dr. Trula Slade in the ED who will take her to the OR on Sunday 10/1.  Patient started on heparin.  Hospitalist consulted for admission.   This a.m. complaining of pain controlled with pain meds.  Awake and alert. Tearful Cta  Reg s1/s2  Distended , soft, open wound mid abd Rt AKA, Lt le no edema  A/P Plan for OR tomorrow. Npo at [place]n Pain control Tried calling daughter no answer, left vm message

## 2022-07-22 NOTE — ED Notes (Signed)
Family at bedside. 

## 2022-07-22 NOTE — H&P (Signed)
History and Physical    Patient: Carla [name] IRJ:[id number] DOB: February 09, 1961 DOA: 07/22/2022 DOS: the patient was seen and examined on 07/22/2022 PCP: [facility]  Patient coming from: Home  Chief Complaint:  Chief Complaint  Patient presents with   Leg Pain    HPI: GEORGIANNA BAND is a 61 y.o. female with medical history significant for peripheral arterial disease s/p right AKA, chronic diastolic dysfunction CHF, anxiety and depression, COPD, paroxysmal A-fib on chronic anticoagulation therapy, who presents to the ED with left leg pain for the past 1 week but has been progressively worsening.  She denies chest pain, shortness of breath, palpitations, nausea vomiting or dizziness. ED course and data review: Vitals within normal limits.  Labs for the most part unremarkable and at baseline.  EKG, personally viewed and interpreted with sinus rhythm at 83 with no acute ST-T wave changes CT angio showing the following: IMPRESSION: VASCULAR   Occlusion of the aorto bi-iliac stents with distal reconstitution of the external iliac/common femoral arteries.   Patent three-vessel runoff of the left lower extremity to the level of the calf where there is non opacification of the distal lower extremity arteries. This may be due to slow flow however occlusion is not excluded.   Occluded SMA stent at the origin with distal reconstitution.   NON-VASCULAR   Right above-the-knee amputation. At the site of amputation there is a fluid collection concerning for abscess.   Large ventral abdominal wall hernia containing nonobstructive loops of bowel.   The patient was evaluated by vascular surgeon, Dr. Trula Slade in the ED who will take her to the OR on Sunday 10/1.  Patient started on heparin.  Hospitalist consulted for admission.   Review of Systems: As mentioned in the history of present illness. All other systems reviewed and are negative.  Past Medical History:  Diagnosis Date    Abdominal aortic atherosclerosis ([place])    a. 05/2017 CTA abd/pelvis: significant atherosclerotic dzs of the infrarenal abd Ao w/ some mural thrombus. No aneurysm or dissection.   Acute focal ischemia of small intestine (HCC)    Acute right-sided low back pain with right-sided sciatica 06/13/2017   AKI (acute kidney injury) ([place]) 07/29/2017   Anemia    Anginal pain ([place])    a. 08/2012 Lexiscan MV: EF 54%, non ischemia/infarct.   Anxiety and depression    Baker's cyst of knee, right    a. 07/2016 U/S: 4.1 x 1.4 x 2.9 cystic structure in R poplitetal fossa.   Bell's palsy    CAD (coronary artery disease)    CHF (congestive heart failure) ([place])    a.) TTE 06/20/2017: EF 60-65%, no rwma, G1DD, no source of cardiac emboli.   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 05/2017 CTA Abd/pelvis: apparent thrombus or embolus in prox SMA (70-90%); b. 05/2017 catheter directed tPA, mechanical thrombectomy, and stenting of the SMA.   Essential hypertension with goal blood pressure less than 140/90 01/19/2016   Gastroesophageal reflux disease 01/19/2016   GERD (gastroesophageal reflux disease)    H/O colectomy    History of 2019 novel coronavirus disease (COVID-19) 11/14/2019   History of kidney stones    Hyperlipidemia    Hypertension    Morbid obesity with  BMI of 40.0-44.9, adult (HCC)    NSTEMI (non-ST elevated myocardial infarction) ([place]) 06/27/2021   a.) high sensitivity troponins trended: 893 --> 1587 --> 1748 --> 868 --> 735 ng/L   Occlusive mesenteric ischemia (HCC) 06/19/2017   Osteoarthritis    PAD (peripheral artery disease) (HCC)    PAF (paroxysmal atrial fibrillation) (HCC)    a.) CHA2DS2-VASc = 6 (sex, CHF, HTN, DVT x2, aortic plaque). b.) rate/rhythm maintained on oral metoprolol  tartrate; chronically anticoagulated with standard dose apixaban.   Palpitations    Peripheral edema    Personal history of other malignant neoplasm of skin 06/01/2016   Pneumonia    Pre-diabetes    Primary osteoarthritis of both knees 06/13/2017   SBO (small bowel obstruction) ([place]) 08/06/2017   Skin cancer of nose    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HCC)    Vitamin D deficiency    Past Surgical History:  Procedure Laterality Date   AMPUTATION Right 04/06/2022   Procedure: AMPUTATION ABOVE KNEE;  Surgeon: Algernon Huxley, MD;  Location: [facility];  Service: Vascular;  Laterality: Right;   AMPUTATION TOE Left 12/09/2019   Procedure: AMPUTATION TOE MPJ left;  Surgeon: Caroline More, DPM;  Location: [facility];  Service: Podiatry;  Laterality: Left;   APPENDECTOMY     APPLICATION OF WOUND VAC  12/01/2021   Procedure: APPLICATION OF WOUND VAC;  Surgeon: Algernon Huxley, MD;  Location: [facility];  Service: Vascular;;   APPLICATION OF WOUND VAC Right 05/03/2022   Procedure: APPLICATION OF WOUND VAC;  Surgeon: Algernon Huxley, MD;  Location: [facility];  Service: Vascular;  Laterality: Right;   CHOLECYSTECTOMY     COLON SURGERY     COLONOSCOPY WITH PROPOFOL N/A 03/23/2022   Procedure: COLONOSCOPY WITH PROPOFOL;  Surgeon: Jonathon Bellows, MD;  Location: [facility];  Service: Gastroenterology;  Laterality: N/A;  rectal bleed   EMBOLECTOMY OR THROMBECTOMY, WITH OR WITHOUT CATHETER; FEMOROPOPLITEAL, AORTOILIAC ARTERY, BY LEG INCISION N/A 06/21/2020   Left - Decomp Fasciotomy Leg; Ant &/Or Lat Compart Only; Midline - Angiography, Visceral, Selective Or Supraselective (With Or Without Flush Aortogram); Left - Revascularize, Endovasc, Open/Percut, Iliac Artery, Unilat, Initial Vessel; W/Translum Stent, W/Angioplasty; Right - Revascularize, Endovasc, Open/Percut, Iliac Artery, Ea Add`L Ipsilateral; W/Translumin Stent, W/Angioplasty; Location [facility];   ENDARTERECTOMY FEMORAL Left 12/01/2021   Procedure: ENDARTERECTOMY  FEMORAL;  Surgeon: Algernon Huxley, MD;  Location: [facility];  Service: Vascular;  Laterality: Left;   ENDOVASCULAR REPAIR/STENT GRAFT Left 12/01/2021   Procedure: ENDOVASCULAR REPAIR/STENT GRAFT;  Surgeon: Algernon Huxley, MD;  Location: [place] [facility];  Service: Cardiovascular;  Laterality: Left;   INCISION AND DRAINAGE OF WOUND Right 05/03/2022   Procedure: IRRIGATION AND DEBRIDEMENT RIGHT STUMP;  Surgeon: Algernon Huxley, MD;  Location: [facility];  Service: Vascular;  Laterality: Right;   LAPAROTOMY N/A 08/08/2017   Procedure: EXPLORATORY LAPAROTOMY POSSIBLE BOWEL RESECTION;  Surgeon: Jules Husbands, MD;  Location: [facility];  Service: General;  Laterality: N/A;   LEFT HEART CATH AND CORONARY ANGIOGRAPHY N/A 05/17/2021   Procedure: LEFT HEART CATH AND CORONARY ANGIOGRAPHY;  Surgeon: Yolonda Kida, MD;  Location: [place] [facility];  Service: Cardiovascular;  Laterality: N/A;   LOWER EXTREMITY ANGIOGRAPHY Left 11/17/2019   Procedure: LOWER EXTREMITY ANGIOGRAPHY;  Surgeon: Algernon Huxley, MD;  Location: [place] [facility];  Service: Cardiovascular;  Laterality: Left;   LOWER EXTREMITY ANGIOGRAPHY Right 12/13/2020   Procedure: LOWER EXTREMITY ANGIOGRAPHY;  Surgeon: Algernon Huxley, MD;  Location:  [place] [facility];  Service: Cardiovascular;  Laterality: Right;   LOWER EXTREMITY ANGIOGRAPHY Right 01/10/2021   Procedure: LOWER EXTREMITY ANGIOGRAPHY;  Surgeon: Algernon Huxley, MD;  Location: [place] [facility];  Service: Cardiovascular;  Laterality: Right;   LOWER EXTREMITY ANGIOGRAPHY Right 01/24/2021   Procedure: LOWER EXTREMITY ANGIOGRAPHY;  Surgeon: Algernon Huxley, MD;  Location: [place] [facility];  Service: Cardiovascular;  Laterality: Right;   LOWER EXTREMITY ANGIOGRAPHY Bilateral 06/28/2021   Procedure: Lower Extremity Angiography;  Surgeon: Algernon Huxley, MD;  Location: [place] [facility];  Service: Cardiovascular;  Laterality: Bilateral;   LOWER EXTREMITY ANGIOGRAPHY Right 11/28/2021    Procedure: LOWER EXTREMITY ANGIOGRAPHY;  Surgeon: Algernon Huxley, MD;  Location: [place] [facility];  Service: Cardiovascular;  Laterality: Right;   LOWER EXTREMITY ANGIOGRAPHY Left 11/30/2021   Procedure: Lower Extremity Angiography;  Surgeon: Algernon Huxley, MD;  Location: [place] [facility];  Service: Cardiovascular;  Laterality: Left;   LOWER EXTREMITY ANGIOGRAPHY Right 02/22/2022   Procedure: Lower Extremity Angiography;  Surgeon: Algernon Huxley, MD;  Location: [place] [facility];  Service: Cardiovascular;  Laterality: Right;   LOWER EXTREMITY ANGIOGRAPHY Right 03/13/2022   Procedure: Lower Extremity Angiography;  Surgeon: Algernon Huxley, MD;  Location: [place] [facility];  Service: Cardiovascular;  Laterality: Right;   LOWER EXTREMITY ANGIOGRAPHY Right 03/24/2022   Procedure: Lower Extremity Angiography;  Surgeon: Algernon Huxley, MD;  Location: [place] [facility];  Service: Cardiovascular;  Laterality: Right;   LOWER EXTREMITY INTERVENTION N/A 11/19/2019   Procedure: LOWER EXTREMITY INTERVENTION;  Surgeon: Algernon Huxley, MD;  Location: [place] [facility];  Service: Cardiovascular;  Laterality: N/A;   LOWER EXTREMITY INTERVENTION Bilateral 06/29/2021   Procedure: LOWER EXTREMITY INTERVENTION;  Surgeon: Algernon Huxley, MD;  Location: [place] [facility];  Service: Cardiovascular;  Laterality: Bilateral;   TEE WITHOUT CARDIOVERSION N/A 06/22/2017   Procedure: TRANSESOPHAGEAL ECHOCARDIOGRAM (TEE);  Surgeon: Wellington Hampshire, MD;  Location: [facility];  Service: Cardiovascular;  Laterality: N/A;   TOTAL KNEE ARTHROPLASTY Right 07/11/2018   Procedure: TOTAL KNEE ARTHROPLASTY;  Surgeon: Corky Mull, MD;  Location: [facility];  Service: Orthopedics;  Laterality: Right;   VAGINAL HYSTERECTOMY     VISCERAL ARTERY INTERVENTION N/A 06/20/2017   Procedure: Visceral Artery Intervention, possible aortic thrombectomy;  Surgeon: Algernon Huxley, MD;  Location: [place] [facility];  Service: Cardiovascular;  Laterality:  N/A;   VISCERAL ARTERY INTERVENTION N/A 01/28/2018   Procedure: VISCERAL ARTERY INTERVENTION;  Surgeon: Algernon Huxley, MD;  Location: [place] [facility];  Service: Cardiovascular;  Laterality: N/A;   Social History:  reports that she quit smoking about 8 months ago. Her smoking use included cigarettes. She smoked an average of 1 pack per day. She has never used smokeless tobacco. She reports that she does not drink alcohol and does not use drugs.  Allergies  Allergen Reactions   Gabapentin     Tremors    Bactrim [Sulfamethoxazole-Trimethoprim] Itching    Family History  Problem Relation Age of Onset   Hypertension Mother    Heart disease Mother    Heart attack Mother    Breast cancer Mother    Hypertension Father    Breast cancer Paternal Aunt     Prior to Admission medications   Medication Sig Start Date End Date Taking? Authorizing Provider  Amino Acids-Protein Hydrolys (PRO-STAT) LIQD Take 30 mLs by mouth in the morning and at bedtime.  [provider]  apixaban (ELIQUIS) 5 MG TABS tablet Take 1 tablet (5 mg total) by mouth 2 (two) times daily. 01/25/21   Stegmayer, Janalyn Harder, PA-C  aspirin EC 81 MG tablet Take 81 mg by mouth daily. Swallow whole.    [provider]  atorvastatin (LIPITOR) 80 MG tablet Take 80 mg by mouth at bedtime.    [provider]  citalopram (CELEXA) 40 MG tablet Take 1 tablet (40 mg total) by mouth daily. 02/04/22 05/05/22  Enzo Bi, MD  COMBIVENT RESPIMAT 20-100 MCG/ACT AERS respimat  03/27/22   [provider]  feeding supplement (ENSURE ENLIVE / ENSURE PLUS) LIQD Take 237 mLs by mouth 2 (two) times daily between meals. 05/06/22   Fritzi Mandes, MD  ferrous sulfate 325 (65 FE) MG tablet Take 325 mg by mouth daily.    [provider]  furosemide (LASIX) 40 MG tablet Take 0.5 tablets (20 mg total) by mouth daily as needed. For leg swelling 05/06/22   Fritzi Mandes, MD  isosorbide mononitrate (IMDUR) 60 MG 24 hr tablet  Take 60 mg by mouth daily. 01/23/22   [provider]  methocarbamol (ROBAXIN) 500 MG tablet Take 1 tablet (500 mg total) by mouth every 8 (eight) hours as needed for muscle spasms. 04/12/22   Kris Hartmann, NP  Multiple Vitamin (MULTIVITAMIN WITH MINERALS) TABS tablet Take 1 tablet by mouth daily. 03/25/22   Fritzi Mandes, MD  omeprazole (PRILOSEC) 20 MG capsule Take 20 mg by mouth 2 (two) times daily before a meal.    [provider]  ondansetron (ZOFRAN) 4 MG tablet Take 4 mg by mouth every 8 (eight) hours as needed for nausea or vomiting.    [provider]  oxyCODONE-acetaminophen (PERCOCET/ROXICET) 5-325 MG tablet Take 1 tablet by mouth every 6 (six) hours as needed for moderate pain. 07/22/22   Kris Hartmann, NP  pregabalin (LYRICA) 100 MG capsule Take 1 capsule (100 mg total) by mouth 3 (three) times daily. 05/08/22   Kris Hartmann, NP    Physical Exam: Vitals:   07/22/22 0130 07/22/22 0139 07/22/22 0200 07/22/22 0230  BP:  137/75 (!) 142/93 (!) 144/84  Pulse: 86 94 88 (!) 104  Resp: '16 18 17 14  '$ SpO2: 99% 100% 100% 97%  Weight:      Height:       Physical Exam Vitals and nursing note reviewed.  Constitutional:      General: She is not in acute distress. HENT:     Head: Normocephalic and atraumatic.  Cardiovascular:     Rate and Rhythm: Normal rate and regular rhythm.     Heart sounds: Normal heart sounds.  Pulmonary:     Effort: Pulmonary effort is normal.     Breath sounds: Normal breath sounds.  Abdominal:     Palpations: Abdomen is soft.     Tenderness: There is no abdominal tenderness.     Comments: Superficial ulcer abdominal wound right lower quadrant  Musculoskeletal:     Comments: Right BKA Dusky appearance to left lower foot and cool to the touch  Neurological:     Mental Status: Mental status is at baseline.     Labs on Admission: I have personally reviewed following labs and imaging studies  CBC: Recent Labs  Lab  07/22/22 0030  WBC 8.3  NEUTROABS 4.8  HGB 11.9*  HCT 38.1  MCV 88.4  PLT 299   Basic Metabolic Panel: Recent Labs  Lab 07/22/22 0030  NA 143  K 3.9  CL 108  CO2 25  GLUCOSE 102*  BUN 12  CREATININE 0.81  CALCIUM 8.9   GFR: Estimated Creatinine Clearance: 70.8 mL/min (by C-G formula based on SCr of 0.81 mg/dL). Liver Function Tests: Recent Labs  Lab 07/22/22 0030  AST 13*  ALT 10  ALKPHOS 90  BILITOT 0.5  PROT 6.7  ALBUMIN 3.1*   No results for input(s): "LIPASE", "AMYLASE" in the last 168 hours. No results for input(s): "AMMONIA" in the last 168 hours. Coagulation Profile: Recent Labs  Lab 07/22/22 0030  INR 0.9   Cardiac Enzymes: No results for input(s): "CKTOTAL", "CKMB", "CKMBINDEX", "TROPONINI" in the last 168 hours. BNP (last 3 results) No results for input(s): "PROBNP" in the last 8760 hours. HbA1C: No results for input(s): "HGBA1C" in the last 72 hours. CBG: No results for input(s): "GLUCAP" in the last 168 hours. Lipid Profile: No results for input(s): "CHOL", "HDL", "LDLCALC", "TRIG", "CHOLHDL", "LDLDIRECT" in the last 72 hours. Thyroid Function Tests: No results for input(s): "TSH", "T4TOTAL", "FREET4", "T3FREE", "THYROIDAB" in the last 72 hours. Anemia Panel: No results for input(s): "VITAMINB12", "FOLATE", "FERRITIN", "TIBC", "IRON", "RETICCTPCT" in the last 72 hours. Urine analysis:    Component Value Date/Time   COLORURINE AMBER (A) 03/22/2022 1520   APPEARANCEUR CLOUDY (A) 03/22/2022 1520   APPEARANCEUR Clear 01/19/2015 1112   LABSPEC 1.016 03/22/2022 1520   LABSPEC 1.019 01/19/2015 1112   PHURINE 6.0 03/22/2022 1520   GLUCOSEU NEGATIVE 03/22/2022 1520   GLUCOSEU Negative 01/19/2015 1112   HGBUR LARGE (A) 03/22/2022 1520   BILIRUBINUR NEGATIVE 03/22/2022 1520   BILIRUBINUR Negative 01/19/2015 1112   KETONESUR NEGATIVE 03/22/2022 1520   PROTEINUR 30 (A) 03/22/2022 1520   NITRITE POSITIVE (A) 03/22/2022 1520   LEUKOCYTESUR LARGE  (A) 03/22/2022 1520   LEUKOCYTESUR Negative 01/19/2015 1112    Radiological Exams on Admission: CT ANGIO AO+BIFEM W & OR WO CONTRAST  Result Date: 07/22/2022 CLINICAL DATA:  Arterial embolism lower extremity EXAM: CT ANGIOGRAPHY OF ABDOMINAL AORTA WITH ILIOFEMORAL RUNOFF TECHNIQUE: Multidetector CT imaging of the abdomen, pelvis and lower extremities was performed using the standard protocol during bolus administration of intravenous contrast. Multiplanar CT image reconstructions and MIPs were obtained to evaluate the vascular anatomy. RADIATION DOSE REDUCTION: This exam was performed according to the departmental dose-optimization program which includes automated exposure control, adjustment of the mA and/or kV according to patient size and/or use of iterative reconstruction technique. CONTRAST:  168m OMNIPAQUE IOHEXOL 350 MG/ML SOLN COMPARISON:  None Available. CT abdomen and pelvis 03/21/2022 FINDINGS: VASCULAR Aorta: Aorto bi iliac stent is occluded. The aorta is otherwise normal in caliber. Celiac: Patent without dissection or aneurysm. SMA: The SMA stent is occluded at the origin. With distal reconstitution. Renals: Patent bilateral renal arteries without aneurysm or dissection. IMA: Not visualized and likely occluded. RIGHT Lower Extremity Inflow: The right common femoral artery stent is occluded with thrombus extending into the right external iliac artery. The right internal iliac artery is likely occluded at its origin with distal reconstitution. Reconstitution of the distal external iliac artery. Outflow: The common femoral, profunda femoral, and superficial femoral arteries are patent and likely supplied from collateral flow. Runoff: Not applicable.  Right above the knee amputation. LEFT Lower Extremity Inflow: The common iliac stent external iliac, and proximal internal iliac are occluded. Distal reconstitution of the internal iliac. Outflow: Reconstitution of the common femoral artery. The  common femoral, profunda femoral, superficial femoral, and popliteal arteries are patent. Runoff: Three-vessel  runoff is patent to a 12-13 cm above the ankle in the calf. At this point the vessels are nonopacified possibly due to slow flow though occlusion is not excluded. Veins: No obvious venous abnormality within the limitations of this arterial phase study. Review of the MIP images confirms the above findings. NON-VASCULAR Lower chest: No acute abnormality. Hepatobiliary: Cholecystectomy. No suspicious focal hepatic lesion. No biliary dilation. Pancreas: Unremarkable. Spleen: Unremarkable. Adrenals/Urinary Tract: Adrenal glands are unremarkable. Kidneys are normal, without renal calculi, focal lesion, or hydronephrosis. Bladder is unremarkable. Stomach/Bowel: Distended stomach. Normal caliber large and small bowel. Colonic diverticulosis without diverticulitis. Herniation of small and large bowel into the large ventral abdominal wall hernia. No evidence of obstruction or strangulation. Bowel anastomosis is present within the hernia sac. Lymphatic: No suspicious adenopathy. Reproductive: Hysterectomy.  No adnexal masses. Other: No free intraperitoneal fluid or gas. Musculoskeletal: Within the distal right thigh at the site of amputation there is a 5.1 x 4.0 cm fluid collection with peripheral enhancement. Edema within the left foot. No acute osseous abnormality. 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 Patent three-vessel runoff of the left lower extremity to the level of the calf where there is non opacification of the distal lower extremity arteries. This may be due to slow flow however occlusion is not excluded. Occluded SMA stent at the origin with distal reconstitution. NON-VASCULAR Right above-the-knee amputation. At the site of amputation there is a fluid collection concerning for abscess. Large ventral abdominal  wall hernia containing nonobstructive loops of bowel. Emergent results were called by telephone at the time of interpretation on 07/22/2022 at 1:14 am to provider JADE SUNG , who verbally acknowledged these results. Electronically Signed   By: Placido Sou M.D.   On: 07/22/2022 01:27     Data Reviewed: Relevant notes from primary care and specialist visits, past discharge summaries as available in EHR, including Care Everywhere. Prior diagnostic testing as pertinent to current admission diagnoses Updated medications and problem lists for reconciliation ED course, including vitals, labs, imaging, treatment and response to treatment Triage notes, nursing and pharmacy notes and ED provider's notes Notable results as noted in HPI   Assessment and Plan: * Critical limb ischemia of left lower extremity ([place]) Peripheral artery disease s/p right BKA Continue heparin infusion Vascular consulted from the ED.  Patient to go to the OR on 10/1 Pain control Eliquis on hold  PAF (paroxysmal atrial fibrillation) (HCC) Eliquis on hold for washout Continue metoprolol for rate control  Chronic diastolic CHF (congestive heart failure) (HCC) Clinically compensated Continue metoprolol and Imdur        DVT prophylaxis: Heparin infusion  Consults: Vascular surgery  Advance Care Planning:   Code Status: Prior   Family Communication: none  Disposition Plan: Back to previous home environment  Severity of Illness: The appropriate patient status for this patient is INPATIENT. Inpatient status is judged to be reasonable and necessary in order to provide the required intensity of service to ensure the patient's safety. The patient's presenting symptoms, physical exam findings, and initial radiographic and laboratory data in the context of their chronic comorbidities is felt to place them at high risk for further clinical deterioration. Furthermore, it is not anticipated that the patient will be  medically stable for discharge from the hospital within 2 midnights of admission.   * I certify that at the point of admission it is my clinical judgment that the patient will require inpatient  hospital care spanning beyond 2 midnights from the point of admission due to high intensity of service, high risk for further deterioration and high frequency of surveillance required.*  Author: Athena Masse, MD 07/22/2022 2:56 AM  For on call review [email or web address].

## 2022-07-22 NOTE — Progress Notes (Signed)
ANTICOAGULATION CONSULT NOTE - Initial Consult  Pharmacy Consult for Heparin  Indication: DVT/Ischemic leg   Allergies  Allergen Reactions   Gabapentin     Tremors    Bactrim [Sulfamethoxazole-Trimethoprim] Itching    Patient Measurements: Height: '5\' 2"'$  (157.5 cm) Weight: 80.7 kg (178 lb) IBW/kg (Calculated) : 50.1 Heparin Dosing Weight: 68.1 kg   Vital Signs:    Labs: Recent Labs    07/22/22 0030  HGB 11.9*  HCT 38.1  PLT 237  APTT 26  LABPROT 12.5  INR 0.9  HEPARINUNFRC 0.12*  CREATININE 0.81  TROPONINIHS 4    Estimated Creatinine Clearance: 70.8 mL/min (by C-G formula based on SCr of 0.81 mg/dL).   Medical History: Past Medical History:  Diagnosis Date   Abdominal aortic atherosclerosis ([place])    a. 05/2017 CTA abd/pelvis: significant atherosclerotic dzs of the infrarenal abd Ao w/ some mural thrombus. No aneurysm or dissection.   Acute focal ischemia of small intestine (HCC)    Acute right-sided low back pain with right-sided sciatica 06/13/2017   AKI (acute kidney injury) ([place]) 07/29/2017   Anemia    Anginal pain ([place])    a. 08/2012 Lexiscan MV: EF 54%, non ischemia/infarct.   Anxiety and depression    Baker's cyst of knee, right    a. 07/2016 U/S: 4.1 x 1.4 x 2.9 cystic structure in R poplitetal fossa.   Bell's palsy    CAD (coronary artery disease)    CHF (congestive heart failure) ([place])    a.) TTE 06/20/2017: EF 60-65%, no rwma, G1DD, no source of cardiac emboli.   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 05/2017 CTA Abd/pelvis: apparent thrombus or embolus in prox SMA (70-90%); b. 05/2017 catheter directed tPA, mechanical thrombectomy, and stenting of the SMA.   Essential hypertension with goal blood pressure less than 140/90 01/19/2016    Gastroesophageal reflux disease 01/19/2016   GERD (gastroesophageal reflux disease)    H/O colectomy    History of 2019 novel coronavirus disease (COVID-19) 11/14/2019   History of kidney stones    Hyperlipidemia    Hypertension    Morbid obesity with BMI of 40.0-44.9, adult ([place])    NSTEMI (non-ST elevated myocardial infarction) ([place]) 06/27/2021   a.) high sensitivity troponins trended: 893 --> 1587 --> 1748 --> 868 --> 735 ng/L   Occlusive mesenteric ischemia ([place]) 06/19/2017   Osteoarthritis    PAD (peripheral artery disease) (HCC)    PAF (paroxysmal atrial fibrillation) (HCC)    a.) CHA2DS2-VASc = 6 (sex, CHF, HTN, DVT x2, aortic plaque). b.) rate/rhythm maintained on oral metoprolol tartrate; chronically anticoagulated with standard dose apixaban.   Palpitations    Peripheral edema    Personal history of other malignant neoplasm of skin 06/01/2016   Pneumonia    Pre-diabetes    Primary osteoarthritis of both knees 06/13/2017   SBO (small bowel obstruction) ([place]) 08/06/2017   Skin cancer of nose    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HCC)    Vitamin D deficiency     Medications:  (Not in a hospital admission)   Assessment: Pharmacy consulted to dose heparin in this 61 year old female admitted with ischemic leg.   Pt reports taking Eliquis 5 mg PO BID PTA with last dose on  9/29 @ 2000.   However, baseline HL not significantly elevated (0.12).   Assume pt is confused or noncompliant.     CrCl = 70.8 ml/min  Goal of Therapy:  Heparin level 0.3-0.7 units/ml Monitor platelets by anticoagulation protocol: Yes   Plan:  Give 4000 units bolus x 1 Start heparin infusion at 1100 units/hr Check anti-Xa level in 6 hours and daily while on heparin Continue to monitor H&H and platelets  Will order aPTT along with first HL in case baseline HL was due to lab error.   Syaire Saber D 07/22/2022,1:17 AM

## 2022-07-22 NOTE — ED Notes (Signed)
Pt c/o 10/10 pain at this time and asking for pain meds. Cristie Hem, RN made aware.

## 2022-07-22 NOTE — Progress Notes (Signed)
ANTICOAGULATION CONSULT NOTE - Initial Consult  Pharmacy Consult for Heparin  Indication: DVT/Ischemic leg   Allergies  Allergen Reactions   Gabapentin     Tremors    Bactrim [Sulfamethoxazole-Trimethoprim] Itching    Patient Measurements: Height: '5\' 2"'$  (157.5 cm) Weight: 80.7 kg (178 lb) IBW/kg (Calculated) : 50.1 Heparin Dosing Weight: 68.1 kg   Vital Signs: Temp: 97.9 F (36.6 C) (09/30 0725) Temp Source: Oral (09/30 0725) BP: 125/63 (09/30 1545) Pulse Rate: 74 (09/30 1545)  Labs: Recent Labs    07/22/22 0030 07/22/22 0630 07/22/22 0730 07/22/22 1751  HGB 11.9*  --   --   --   HCT 38.1  --   --   --   PLT 237  --   --   --   APTT 26  --  49*  --   LABPROT 12.5  --   --   --   INR 0.9  --   --   --   HEPARINUNFRC 0.12*  --  0.29* 0.20*  CREATININE 0.81  --   --   --   TROPONINIHS 4 4  --   --     Estimated Creatinine Clearance: 70.8 mL/min (by C-G formula based on SCr of 0.81 mg/dL).   Medical History: Past Medical History:  Diagnosis Date   Abdominal aortic atherosclerosis ([place])    a. 05/2017 CTA abd/pelvis: significant atherosclerotic dzs of the infrarenal abd Ao w/ some mural thrombus. No aneurysm or dissection.   Acute focal ischemia of small intestine (HCC)    Acute right-sided low back pain with right-sided sciatica 06/13/2017   AKI (acute kidney injury) ([place]) 07/29/2017   Anemia    Anginal pain ([place])    a. 08/2012 Lexiscan MV: EF 54%, non ischemia/infarct.   Anxiety and depression    Baker's cyst of knee, right    a. 07/2016 U/S: 4.1 x 1.4 x 2.9 cystic structure in R poplitetal fossa.   Bell's palsy    CAD (coronary artery disease)    CHF (congestive heart failure) ([place])    a.) TTE 06/20/2017: EF 60-65%, no rwma, G1DD, no source of cardiac emboli.   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 05/2017 CTA Abd/pelvis: apparent thrombus or embolus in prox SMA (70-90%); b. 05/2017 catheter directed tPA, mechanical thrombectomy, and stenting of the SMA.   Essential hypertension with goal blood pressure less than 140/90 01/19/2016   Gastroesophageal reflux disease 01/19/2016   GERD (gastroesophageal reflux disease)    H/O colectomy    History of 2019 novel coronavirus disease (COVID-19) 11/14/2019   History of kidney stones    Hyperlipidemia    Hypertension    Morbid obesity with BMI of 40.0-44.9, adult ([place])    NSTEMI (non-ST elevated myocardial infarction) ([place]) 06/27/2021   a.) high sensitivity troponins trended: 893 --> 1587 --> 1748 --> 868 --> 735 ng/L   Occlusive mesenteric ischemia ([place]) 06/19/2017   Osteoarthritis    PAD (peripheral artery disease) (HCC)    PAF (paroxysmal atrial fibrillation) (HCC)    a.) CHA2DS2-VASc = 6 (sex, CHF, HTN, DVT x2, aortic plaque). b.) rate/rhythm maintained on oral metoprolol tartrate; chronically anticoagulated with standard dose apixaban.   Palpitations    Peripheral edema  Personal history of other malignant neoplasm of skin 06/01/2016   Pneumonia    Pre-diabetes    Primary osteoarthritis of both knees 06/13/2017   SBO (small bowel obstruction) ([place]) 08/06/2017   Skin cancer of nose    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HCC)    Vitamin D deficiency     Medications:  (Not in a hospital admission)  Assessment: Pharmacy consulted to dose heparin in this 61 year old female admitted with ischemic leg.   Pt reports taking Eliquis 5 mg PO BID PTA with last dose on 9/29 @ 2000.   However, baseline HL not significantly elevated (0.12).   Assume pt is confused or noncompliant.     CrCl = 70.8 ml/min  Goal of Therapy:  Heparin level 0.3-0.7 units/ml Monitor platelets by anticoagulation protocol: Yes  Date Time HL Rate/Comment 9/30 0730 0.29 Subtherapeutic/ 1000u + incr from 1100  > 1250 u/hr 9/30 1751 0.20 Subtherapeutic/ 2000u + incr from 1250 > 1550 u/hr   Plan:  Heparin level subtherapeutic  Give heparin 2000 units IV  x1 then increase heparin infusion to 1550 units/hr Check anti-Xa level in 6 hours and daily while on heparin Continue to monitor H&H and platelets   Darrick Penna 07/22/2022,6:10 PM

## 2022-07-22 NOTE — Assessment & Plan Note (Addendum)
Continue metoprolol and Eliquis

## 2022-07-22 NOTE — H&P (View-Only) (Signed)
    Subjective  -   No acute overnight events.  Pain is moderately controlled with narcotics   Physical Exam:  No change in the appearance of the left foot       Assessment/Plan:    Left foot ischemia: We again discussed proceeding with left axillary to femoral bypass graft for limb salvage.  She will remain on heparin.  There is no need to stop the heparin prior to surgery.  She will be n.p.o. after midnight.  [name] 07/22/2022 7:17 PM --  Vitals:   07/22/22 1545 07/22/22 1900  BP: 125/63 (!) 146/72  Pulse: 74 72  Resp: 16 18  Temp:    SpO2: 96% 93%   No intake or output data in the 24 hours ending 07/22/22 1917   Laboratory CBC    Component Value Date/Time   WBC 8.3 07/22/2022 0030   HGB 11.9 (L) 07/22/2022 0030   HGB 13.4 02/12/2015 1137   HCT 38.1 07/22/2022 0030   HCT 40.9 02/12/2015 1137   PLT 237 07/22/2022 0030   PLT 272 02/12/2015 1137    BMET    Component Value Date/Time   NA 143 07/22/2022 0030   NA 140 02/12/2015 1137   K 3.9 07/22/2022 0030   K 2.9 (L) 02/12/2015 1137   CL 108 07/22/2022 0030   CL 107 02/12/2015 1137   CO2 25 07/22/2022 0030   CO2 27 02/12/2015 1137   GLUCOSE 102 (H) 07/22/2022 0030   GLUCOSE 120 (H) 02/12/2015 1137   BUN 12 07/22/2022 0030   BUN 15 02/12/2015 1137   CREATININE 0.81 07/22/2022 0030   CREATININE 0.78 02/12/2015 1137   CALCIUM 8.9 07/22/2022 0030   CALCIUM 9.0 02/12/2015 1137   GFRNONAA >60 07/22/2022 0030   GFRNONAA >60 02/12/2015 1137   GFRAA >60 11/23/2019 1330   GFRAA >60 02/12/2015 1137    COAG Lab Results  Component Value Date   INR 0.9 07/22/2022   INR 1.3 (H) 05/02/2022   INR 0.9 06/27/2021   No results found for: "PTT"  Antibiotics Anti-infectives (From admission, onward)    None        V. Carla [name], M.D., [facility] [place] Office: [phone] Pager:  [phone]

## 2022-07-22 NOTE — ED Notes (Signed)
When attempting to give pain medication in LAC IV, it would not flush- pulled line and placed a new one- pt also given drink and ice

## 2022-07-22 NOTE — Progress Notes (Signed)
ANTICOAGULATION CONSULT NOTE - Initial Consult  Pharmacy Consult for Heparin  Indication: DVT/Ischemic leg   Allergies  Allergen Reactions   Gabapentin     Tremors    Bactrim [Sulfamethoxazole-Trimethoprim] Itching    Patient Measurements: Height: '5\' 2"'$  (157.5 cm) Weight: 80.7 kg (178 lb) IBW/kg (Calculated) : 50.1 Heparin Dosing Weight: 68.1 kg   Vital Signs: Temp: 97.9 F (36.6 C) (09/30 0725) Temp Source: Oral (09/30 0725) BP: 120/71 (09/30 0930) Pulse Rate: 79 (09/30 0930)  Labs: Recent Labs    07/22/22 0030 07/22/22 0630 07/22/22 0730  HGB 11.9*  --   --   HCT 38.1  --   --   PLT 237  --   --   APTT 26  --  49*  LABPROT 12.5  --   --   INR 0.9  --   --   HEPARINUNFRC 0.12*  --  0.29*  CREATININE 0.81  --   --   TROPONINIHS 4 4  --      Estimated Creatinine Clearance: 70.8 mL/min (by C-G formula based on SCr of 0.81 mg/dL).   Medical History: Past Medical History:  Diagnosis Date   Abdominal aortic atherosclerosis ([place])    a. 05/2017 CTA abd/pelvis: significant atherosclerotic dzs of the infrarenal abd Ao w/ some mural thrombus. No aneurysm or dissection.   Acute focal ischemia of small intestine (HCC)    Acute right-sided low back pain with right-sided sciatica 06/13/2017   AKI (acute kidney injury) ([place]) 07/29/2017   Anemia    Anginal pain ([place])    a. 08/2012 Lexiscan MV: EF 54%, non ischemia/infarct.   Anxiety and depression    Baker's cyst of knee, right    a. 07/2016 U/S: 4.1 x 1.4 x 2.9 cystic structure in R poplitetal fossa.   Bell's palsy    CAD (coronary artery disease)    CHF (congestive heart failure) ([place])    a.) TTE 06/20/2017: EF 60-65%, no rwma, G1DD, no source of cardiac emboli.   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 05/2017  CTA Abd/pelvis: apparent thrombus or embolus in prox SMA (70-90%); b. 05/2017 catheter directed tPA, mechanical thrombectomy, and stenting of the SMA.   Essential hypertension with goal blood pressure less than 140/90 01/19/2016   Gastroesophageal reflux disease 01/19/2016   GERD (gastroesophageal reflux disease)    H/O colectomy    History of 2019 novel coronavirus disease (COVID-19) 11/14/2019   History of kidney stones    Hyperlipidemia    Hypertension    Morbid obesity with BMI of 40.0-44.9, adult ([place])    NSTEMI (non-ST elevated myocardial infarction) ([place]) 06/27/2021   a.) high sensitivity troponins trended: 893 --> 1587 --> 1748 --> 868 --> 735 ng/L   Occlusive mesenteric ischemia ([place]) 06/19/2017   Osteoarthritis    PAD (peripheral artery disease) (HCC)    PAF (paroxysmal atrial fibrillation) (HCC)    a.) CHA2DS2-VASc = 6 (sex, CHF, HTN, DVT x2, aortic plaque). b.) rate/rhythm maintained on oral metoprolol tartrate; chronically anticoagulated with standard dose apixaban.   Palpitations    Peripheral edema    Personal history of other malignant neoplasm of skin 06/01/2016   Pneumonia    Pre-diabetes    Primary osteoarthritis of both knees 06/13/2017  SBO (small bowel obstruction) ([place]) 08/06/2017   Skin cancer of nose    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HCC)    Vitamin D deficiency     Medications:  (Not in a hospital admission)  Assessment: Pharmacy consulted to dose heparin in this 61 year old female admitted with ischemic leg.   Pt reports taking Eliquis 5 mg PO BID PTA with last dose on 9/29 @ 2000.   However, baseline HL not significantly elevated (0.12).   Assume pt is confused or noncompliant.     CrCl = 70.8 ml/min  Goal of Therapy:  Heparin level 0.3-0.7 units/ml Monitor platelets by anticoagulation protocol: Yes   Plan:  9/30'@0730'$ : aPTT 49sec, HL 0.29, both subtherapeutic Give 1000 units bolus x 1 Start heparin infusion at 1250 units/hr Check  anti-Xa level in 6 hours and daily while on heparin Continue to monitor H&H and platelets   Demario Faniel A Eder Macek 07/22/2022,11:45 AM

## 2022-07-23 ENCOUNTER — Inpatient Hospital Stay: Payer: Medicare Other | Admitting: Certified Registered"

## 2022-07-23 ENCOUNTER — Encounter
Admission: EM | Disposition: A | Discharge status: Skilled Nursing Facility | Payer: Self-pay | Source: Home / Self Care | Attending: Internal Medicine

## 2022-07-23 ENCOUNTER — Other Ambulatory Visit: Payer: Self-pay

## 2022-07-23 ENCOUNTER — Encounter: Payer: Self-pay | Admitting: Internal Medicine

## 2022-07-23 ENCOUNTER — Inpatient Hospital Stay: Payer: Medicare Other | Admitting: Anesthesiology

## 2022-07-23 DIAGNOSIS — I70222 Atherosclerosis of native arteries of extremities with rest pain, left leg: Secondary | ICD-10-CM | POA: Diagnosis not present

## 2022-07-23 HISTORY — PX: APPLICATION OF WOUND VAC: SHX5189

## 2022-07-23 HISTORY — PX: AXILLARY-FEMORAL BYPASS GRAFT: SHX894

## 2022-07-23 LAB — GLUCOSE, CAPILLARY
Glucose-Capillary: 207 mg/dL — ABNORMAL HIGH (ref 70–99)
Glucose-Capillary: 163 mg/dL — ABNORMAL HIGH (ref 70–99)

## 2022-07-23 LAB — BASIC METABOLIC PANEL
Anion gap: 8 (ref 5–15)
BUN: 11 mg/dL (ref 8–23)
CO2: 20 mmol/L — ABNORMAL LOW (ref 22–32)
Calcium: 8.4 mg/dL — ABNORMAL LOW (ref 8.9–10.3)
Chloride: 109 mmol/L (ref 98–111)
Creatinine, Ser: 0.86 mg/dL (ref 0.44–1.00)
GFR, Estimated: >60 mL/min (ref >60)
Glucose, Bld: 230 mg/dL — ABNORMAL HIGH (ref 70–99)
Potassium: 4.5 mmol/L (ref 3.5–5.1)
Sodium: 137 mmol/L (ref 135–145)

## 2022-07-23 LAB — CBC
HCT: 33.7 % — ABNORMAL LOW (ref 36.0–46.0)
HCT: 34.8 % — ABNORMAL LOW (ref 36.0–46.0)
Hemoglobin: 10.3 g/dL — ABNORMAL LOW (ref 12.0–15.0)
Hemoglobin: 10.6 g/dL — ABNORMAL LOW (ref 12.0–15.0)
MCH: 27.3 pg (ref 26.0–34.0)
MCH: 27.7 pg (ref 26.0–34.0)
MCHC: 30.5 g/dL (ref 30.0–36.0)
MCHC: 30.6 g/dL (ref 30.0–36.0)
MCV: 89.7 fL (ref 80.0–100.0)
MCV: 90.6 fL (ref 80.0–100.0)
Platelets: 167 10*3/uL (ref 150–400)
Platelets: 197 10*3/uL (ref 150–400)
RBC: 3.72 MIL/uL — ABNORMAL LOW (ref 3.87–5.11)
RBC: 3.88 MIL/uL (ref 3.87–5.11)
RDW: 16.9 % — ABNORMAL HIGH (ref 11.5–15.5)
RDW: 17 % — ABNORMAL HIGH (ref 11.5–15.5)
WBC: 12.2 10*3/uL — ABNORMAL HIGH (ref 4.0–10.5)
WBC: 6.3 10*3/uL (ref 4.0–10.5)
nRBC: 0 % (ref 0.0–0.2)
nRBC: 0 % (ref 0.0–0.2)

## 2022-07-23 LAB — HEPARIN LEVEL (UNFRACTIONATED)
Heparin Unfractionated: 0.72 IU/mL — ABNORMAL HIGH (ref 0.30–0.70)
Heparin Unfractionated: >1.1 IU/mL — ABNORMAL HIGH (ref 0.30–0.70)
Heparin Unfractionated: >1.1 IU/mL — ABNORMAL HIGH (ref 0.30–0.70)

## 2022-07-23 LAB — APTT: aPTT: 162 seconds (ref 24–36)

## 2022-07-23 LAB — MRSA NEXT GEN BY PCR, NASAL: MRSA by PCR Next Gen: DETECTED — AB

## 2022-07-23 SURGERY — THROMBECTOMY OF GREAT VESSEL
Anesthesia: General | Site: Groin | Laterality: Left

## 2022-07-23 SURGERY — CREATION, BYPASS, ARTERIAL, AXILLARY TO BILATERAL FEMORAL, USING GRAFT
Anesthesia: General | Site: Groin | Laterality: Left

## 2022-07-23 MED ORDER — INSULIN ASPART 100 UNIT/ML IJ SOLN
0.0000 [IU] | INTRAMUSCULAR | Status: DC
  Administered 2022-07-23 – 2022-07-24 (×5): 3 [IU] via SUBCUTANEOUS
  Administered 2022-07-24: 2 [IU] via SUBCUTANEOUS
  Administered 2022-07-24 – 2022-07-25 (×5): 3 [IU] via SUBCUTANEOUS
  Administered 2022-07-25: 2 [IU] via SUBCUTANEOUS
  Administered 2022-07-25: 3 [IU] via SUBCUTANEOUS
  Administered 2022-07-26 (×3): 2 [IU] via SUBCUTANEOUS
  Administered 2022-07-26: 3 [IU] via SUBCUTANEOUS
  Administered 2022-07-26 – 2022-07-28 (×9): 2 [IU] via SUBCUTANEOUS
  Administered 2022-07-29: 3 [IU] via SUBCUTANEOUS
  Administered 2022-07-29 (×3): 2 [IU] via SUBCUTANEOUS
  Administered 2022-07-30 (×2): 3 [IU] via SUBCUTANEOUS
  Administered 2022-07-30 – 2022-07-31 (×2): 2 [IU] via SUBCUTANEOUS
  Administered 2022-07-31 (×2): 3 [IU] via SUBCUTANEOUS
  Administered 2022-07-31: 2 [IU] via SUBCUTANEOUS
  Administered 2022-07-31: 3 [IU] via SUBCUTANEOUS
  Administered 2022-07-31 – 2022-08-01 (×3): 2 [IU] via SUBCUTANEOUS
  Administered 2022-08-01: 3 [IU] via SUBCUTANEOUS
  Administered 2022-08-01 – 2022-08-02 (×3): 2 [IU] via SUBCUTANEOUS
  Administered 2022-08-02: 3 [IU] via SUBCUTANEOUS
  Administered 2022-08-03: 2 [IU] via SUBCUTANEOUS
  Administered 2022-08-03 (×3): 3 [IU] via SUBCUTANEOUS
  Administered 2022-08-04 (×2): 2 [IU] via SUBCUTANEOUS
  Administered 2022-08-05 – 2022-08-06 (×2): 3 [IU] via SUBCUTANEOUS
  Administered 2022-08-06: 2 [IU] via SUBCUTANEOUS
  Administered 2022-08-06: 3 [IU] via SUBCUTANEOUS
  Administered 2022-08-06: 2 [IU] via SUBCUTANEOUS
  Administered 2022-08-07: 3 [IU] via SUBCUTANEOUS
  Administered 2022-08-07: 2 [IU] via SUBCUTANEOUS
  Administered 2022-08-07: 3 [IU] via SUBCUTANEOUS
  Administered 2022-08-08 (×2): 2 [IU] via SUBCUTANEOUS
  Administered 2022-08-08: 3 [IU] via SUBCUTANEOUS
  Administered 2022-08-09: 2 [IU] via SUBCUTANEOUS
  Administered 2022-08-09: 3 [IU] via SUBCUTANEOUS
  Administered 2022-08-09 (×2): 2 [IU] via SUBCUTANEOUS
  Administered 2022-08-10: 3 [IU] via SUBCUTANEOUS
  Administered 2022-08-10 – 2022-08-11 (×3): 2 [IU] via SUBCUTANEOUS
  Administered 2022-08-11: 3 [IU] via SUBCUTANEOUS
  Administered 2022-08-11 – 2022-08-12 (×2): 2 [IU] via SUBCUTANEOUS
  Filled 2022-07-23 (×74): qty 1

## 2022-07-23 MED ORDER — OXYCODONE HCL 5 MG PO TABS
5.0000 mg | ORAL_TABLET | ORAL | Status: DC | PRN
  Administered 2022-07-26 – 2022-07-30 (×6): 10 mg via ORAL
  Filled 2022-07-23 (×6): qty 2

## 2022-07-23 MED ORDER — POLYETHYLENE GLYCOL 3350 17 G PO PACK
17.0000 g | PACK | Freq: Every day | ORAL | Status: DC | PRN
  Administered 2022-07-31 – 2022-08-08 (×4): 17 g via ORAL
  Filled 2022-07-23 (×4): qty 1

## 2022-07-23 MED ORDER — HYDRALAZINE HCL 20 MG/ML IJ SOLN: 5.0000 mg | INTRAMUSCULAR | Status: DC | PRN

## 2022-07-23 MED ORDER — CEFAZOLIN SODIUM-DEXTROSE 2-3 GM-%(50ML) IV SOLR
INTRAVENOUS | Status: DC | PRN
  Administered 2022-07-23: 2 g via INTRAVENOUS

## 2022-07-23 MED ORDER — DEXAMETHASONE SODIUM PHOSPHATE 10 MG/ML IJ SOLN
INTRAMUSCULAR | Status: AC
  Filled 2022-07-23: qty 1

## 2022-07-23 MED ORDER — HEPARIN (PORCINE) 25000 UT/250ML-% IV SOLN
INTRAVENOUS | Status: DC | PRN
  Administered 2022-07-23: 1400 [IU]/h via INTRAVENOUS

## 2022-07-23 MED ORDER — MAGNESIUM SULFATE 2 GM/50ML IV SOLN: 2.0000 g | Freq: Every day | INTRAVENOUS | Status: DC | PRN

## 2022-07-23 MED ORDER — LIDOCAINE HCL (CARDIAC) PF 100 MG/5ML IV SOSY
PREFILLED_SYRINGE | INTRAVENOUS | Status: DC | PRN
  Administered 2022-07-23: 100 mg via INTRAVENOUS

## 2022-07-23 MED ORDER — PROPOFOL 10 MG/ML IV BOLUS
INTRAVENOUS | Status: AC
  Filled 2022-07-23: qty 20

## 2022-07-23 MED ORDER — ACETAMINOPHEN 10 MG/ML IV SOLN
INTRAVENOUS | Status: AC
  Filled 2022-07-23: qty 100

## 2022-07-23 MED ORDER — HEPARIN SODIUM (PORCINE) 1000 UNIT/ML IJ SOLN
INTRAMUSCULAR | Status: DC | PRN
  Administered 2022-07-23: 1000 [IU] via INTRAVENOUS
  Administered 2022-07-23: 8000 [IU] via INTRAVENOUS
  Administered 2022-07-23: 1000 [IU] via INTRAVENOUS

## 2022-07-23 MED ORDER — PHENYLEPHRINE HCL-NACL 20-0.9 MG/250ML-% IV SOLN
INTRAVENOUS | Status: DC | PRN
  Administered 2022-07-23: 40 ug/min via INTRAVENOUS

## 2022-07-23 MED ORDER — LACTATED RINGERS IV SOLN: INTRAVENOUS | Status: DC | PRN

## 2022-07-23 MED ORDER — DEXAMETHASONE SODIUM PHOSPHATE 10 MG/ML IJ SOLN
INTRAMUSCULAR | Status: DC | PRN
  Administered 2022-07-23: 10 mg via INTRAVENOUS

## 2022-07-23 MED ORDER — SUCCINYLCHOLINE CHLORIDE 200 MG/10ML IV SOSY
PREFILLED_SYRINGE | INTRAVENOUS | Status: DC | PRN
  Administered 2022-07-23: 100 mg via INTRAVENOUS

## 2022-07-23 MED ORDER — ROCURONIUM BROMIDE 100 MG/10ML IV SOLN
INTRAVENOUS | Status: DC | PRN
  Administered 2022-07-23: 50 mg via INTRAVENOUS
  Administered 2022-07-23: 30 mg via INTRAVENOUS

## 2022-07-23 MED ORDER — HYDROMORPHONE HCL 1 MG/ML IJ SOLN
INTRAMUSCULAR | Status: AC
  Filled 2022-07-23: qty 1

## 2022-07-23 MED ORDER — MIDAZOLAM HCL 2 MG/2ML IJ SOLN
INTRAMUSCULAR | Status: DC | PRN
  Administered 2022-07-23: 1 mg via INTRAVENOUS

## 2022-07-23 MED ORDER — HEPARIN 5000 UNITS IN NS 1000 ML (FLUSH)
INTRAMUSCULAR | Status: DC | PRN
  Administered 2022-07-23: 500 mL via INTRAMUSCULAR

## 2022-07-23 MED ORDER — SUCCINYLCHOLINE CHLORIDE 200 MG/10ML IV SOSY
PREFILLED_SYRINGE | INTRAVENOUS | Status: AC
  Filled 2022-07-23: qty 10

## 2022-07-23 MED ORDER — HEPARIN (PORCINE) 25000 UT/250ML-% IV SOLN
1900.0000 [IU]/h | INTRAVENOUS | Status: DC
  Administered 2022-07-24: 1600 [IU]/h via INTRAVENOUS
  Administered 2022-07-25: 1400 [IU]/h via INTRAVENOUS
  Administered 2022-07-25: 1600 [IU]/h via INTRAVENOUS
  Administered 2022-07-26 – 2022-07-28 (×4): 1400 [IU]/h via INTRAVENOUS
  Administered 2022-07-29: 1550 [IU]/h via INTRAVENOUS
  Administered 2022-07-30 – 2022-07-31 (×2): 1700 [IU]/h via INTRAVENOUS
  Administered 2022-07-31 – 2022-08-01 (×2): 1950 [IU]/h via INTRAVENOUS
  Filled 2022-07-23 (×14): qty 250

## 2022-07-23 MED ORDER — SODIUM CHLORIDE 0.9 % IV SOLN: 500.0000 mL | Freq: Once | INTRAVENOUS | Status: DC | PRN

## 2022-07-23 MED ORDER — LIDOCAINE HCL (PF) 2 % IJ SOLN
INTRAMUSCULAR | Status: AC
  Filled 2022-07-23: qty 5

## 2022-07-23 MED ORDER — MIDAZOLAM HCL 2 MG/2ML IJ SOLN
INTRAMUSCULAR | Status: AC
  Filled 2022-07-23: qty 2

## 2022-07-23 MED ORDER — PROTAMINE SULFATE 10 MG/ML IV SOLN
INTRAVENOUS | Status: DC | PRN
  Administered 2022-07-23: 50 mg via INTRAVENOUS

## 2022-07-23 MED ORDER — PHENYLEPHRINE HCL-NACL 20-0.9 MG/250ML-% IV SOLN
INTRAVENOUS | Status: AC
  Filled 2022-07-23: qty 250

## 2022-07-23 MED ORDER — ACETAMINOPHEN 325 MG RE SUPP: 325.0000 mg | RECTAL | Status: DC | PRN

## 2022-07-23 MED ORDER — HYDROMORPHONE HCL 1 MG/ML IJ SOLN
0.5000 mg | INTRAMUSCULAR | Status: DC | PRN
  Administered 2022-07-23 (×4): 0.25 mg via INTRAVENOUS
  Administered 2022-07-23: 0.5 mg via INTRAVENOUS
  Administered 2022-07-24 – 2022-07-25 (×3): 1 mg via INTRAVENOUS
  Administered 2022-07-25: 0.5 mg via INTRAVENOUS
  Administered 2022-07-25 – 2022-07-26 (×2): 1 mg via INTRAVENOUS
  Filled 2022-07-23 (×7): qty 1

## 2022-07-23 MED ORDER — PHENYLEPHRINE HCL (PRESSORS) 10 MG/ML IV SOLN
INTRAVENOUS | Status: DC | PRN
  Administered 2022-07-23 (×3): 100 ug via INTRAVENOUS

## 2022-07-23 MED ORDER — PROPOFOL 10 MG/ML IV BOLUS
INTRAVENOUS | Status: DC | PRN
  Administered 2022-07-23: 120 mg via INTRAVENOUS

## 2022-07-23 MED ORDER — PHENYLEPHRINE HCL-NACL 20-0.9 MG/250ML-% IV SOLN
INTRAVENOUS | Status: DC | PRN
  Administered 2022-07-23: 20 ug/min via INTRAVENOUS

## 2022-07-23 MED ORDER — FENTANYL CITRATE (PF) 100 MCG/2ML IJ SOLN
INTRAMUSCULAR | Status: AC
  Administered 2022-07-23: 50 ug via INTRAVENOUS
  Filled 2022-07-23: qty 2

## 2022-07-23 MED ORDER — ACETAMINOPHEN 325 MG PO TABS: 325.0000 mg | ORAL_TABLET | ORAL | Status: DC | PRN

## 2022-07-23 MED ORDER — CHLORHEXIDINE GLUCONATE CLOTH 2 % EX PADS
6.0000 | MEDICATED_PAD | Freq: Every day | CUTANEOUS | Status: DC
  Administered 2022-07-23: 6 via TOPICAL

## 2022-07-23 MED ORDER — HEMOSTATIC AGENTS (NO CHARGE) OPTIME: TOPICAL | Status: DC | PRN

## 2022-07-23 MED ORDER — METOPROLOL TARTRATE 5 MG/5ML IV SOLN: 2.0000 mg | INTRAVENOUS | Status: DC | PRN

## 2022-07-23 MED ORDER — HYDROMORPHONE HCL 1 MG/ML IJ SOLN
INTRAMUSCULAR | Status: DC | PRN
  Administered 2022-07-23 (×2): 1 mg via INTRAVENOUS

## 2022-07-23 MED ORDER — ASPIRIN 81 MG PO TBEC
81.0000 mg | DELAYED_RELEASE_TABLET | Freq: Every day | ORAL | Status: DC
  Administered 2022-07-24 – 2022-08-12 (×20): 81 mg via ORAL
  Filled 2022-07-23 (×20): qty 1

## 2022-07-23 MED ORDER — HEPARIN 5000 UNITS IN NS 1000 ML (FLUSH)
INTRAMUSCULAR | Status: DC | PRN
  Administered 2022-07-23: 250 mL via INTRAMUSCULAR

## 2022-07-23 MED ORDER — ROCURONIUM BROMIDE 10 MG/ML (PF) SYRINGE
PREFILLED_SYRINGE | INTRAVENOUS | Status: AC
  Filled 2022-07-23: qty 10

## 2022-07-23 MED ORDER — 0.9 % SODIUM CHLORIDE (POUR BTL) OPTIME
TOPICAL | Status: DC | PRN
  Administered 2022-07-23: 500 mL

## 2022-07-23 MED ORDER — POTASSIUM CHLORIDE CRYS ER 20 MEQ PO TBCR: 20.0000 meq | EXTENDED_RELEASE_TABLET | Freq: Every day | ORAL | Status: DC | PRN

## 2022-07-23 MED ORDER — KETAMINE HCL 50 MG/5ML IJ SOSY
PREFILLED_SYRINGE | INTRAMUSCULAR | Status: AC
  Filled 2022-07-23: qty 5

## 2022-07-23 MED ORDER — FENTANYL CITRATE (PF) 100 MCG/2ML IJ SOLN
INTRAMUSCULAR | Status: DC | PRN
  Administered 2022-07-23: 50 ug via INTRAVENOUS
  Administered 2022-07-23: 150 ug via INTRAVENOUS
  Administered 2022-07-23: 50 ug via INTRAVENOUS

## 2022-07-23 MED ORDER — PHENYLEPHRINE HCL (PRESSORS) 10 MG/ML IV SOLN
INTRAVENOUS | Status: AC
  Filled 2022-07-23: qty 1

## 2022-07-23 MED ORDER — ACETAMINOPHEN 10 MG/ML IV SOLN
INTRAVENOUS | Status: DC | PRN
  Administered 2022-07-23: 1000 mg via INTRAVENOUS

## 2022-07-23 MED ORDER — EPHEDRINE SULFATE (PRESSORS) 50 MG/ML IJ SOLN
INTRAMUSCULAR | Status: DC | PRN
  Administered 2022-07-23: 15 mg via INTRAVENOUS
  Administered 2022-07-23: 10 mg via INTRAVENOUS

## 2022-07-23 MED ORDER — KETAMINE HCL 10 MG/ML IJ SOLN
INTRAMUSCULAR | Status: DC | PRN
  Administered 2022-07-23: 10 mg via INTRAVENOUS
  Administered 2022-07-23: 20 mg via INTRAVENOUS

## 2022-07-23 MED ORDER — PHENOL 1.4 % MT LIQD: 1.0000 | OROMUCOSAL | Status: DC | PRN

## 2022-07-23 MED ORDER — HYDROMORPHONE HCL 1 MG/ML IJ SOLN
0.2500 mg | INTRAMUSCULAR | Status: AC | PRN
  Administered 2022-07-23 (×2): 0.25 mg via INTRAVENOUS

## 2022-07-23 MED ORDER — GUAIFENESIN-DM 100-10 MG/5ML PO SYRP: 15.0000 mL | ORAL_SOLUTION | ORAL | Status: DC | PRN

## 2022-07-23 MED ORDER — MUPIROCIN 2 % EX OINT
1.0000 | TOPICAL_OINTMENT | Freq: Two times a day (BID) | CUTANEOUS | Status: AC
  Administered 2022-07-23 – 2022-07-27 (×9): 1 via NASAL
  Filled 2022-07-23 (×2): qty 22

## 2022-07-23 MED ORDER — ONDANSETRON HCL 4 MG/2ML IJ SOLN: 4.0000 mg | Freq: Once | INTRAMUSCULAR | Status: DC | PRN

## 2022-07-23 MED ORDER — HYDROMORPHONE HCL 1 MG/ML IJ SOLN
INTRAMUSCULAR | Status: AC
  Administered 2022-07-23: 0.25 mg via INTRAVENOUS
  Filled 2022-07-23: qty 1

## 2022-07-23 MED ORDER — KETAMINE HCL 10 MG/ML IJ SOLN
INTRAMUSCULAR | Status: DC | PRN
  Administered 2022-07-23 (×2): 20 mg via INTRAVENOUS

## 2022-07-23 MED ORDER — PANTOPRAZOLE SODIUM 40 MG PO TBEC
40.0000 mg | DELAYED_RELEASE_TABLET | Freq: Every day | ORAL | Status: DC
  Administered 2022-07-24 – 2022-08-12 (×20): 40 mg via ORAL
  Filled 2022-07-23 (×20): qty 1

## 2022-07-23 MED ORDER — ONDANSETRON HCL 4 MG/2ML IJ SOLN: 4.0000 mg | Freq: Four times a day (QID) | INTRAMUSCULAR | Status: DC | PRN

## 2022-07-23 MED ORDER — SURGIFLO WITH THROMBIN (HEMOSTATIC MATRIX KIT) OPTIME
TOPICAL | Status: DC | PRN
  Administered 2022-07-23: 1 via TOPICAL

## 2022-07-23 MED ORDER — FENTANYL CITRATE (PF) 250 MCG/5ML IJ SOLN
INTRAMUSCULAR | Status: AC
  Filled 2022-07-23: qty 5

## 2022-07-23 MED ORDER — HYDRALAZINE HCL 20 MG/ML IJ SOLN
INTRAMUSCULAR | Status: DC | PRN
  Administered 2022-07-23: 4 mg via INTRAVENOUS
  Administered 2022-07-23: 2 mg via INTRAVENOUS
  Administered 2022-07-23: 4 mg via INTRAVENOUS
  Administered 2022-07-23: 6 mg via INTRAVENOUS
  Administered 2022-07-23: 4 mg via INTRAVENOUS

## 2022-07-23 MED ORDER — DEXTROSE-NACL 5-0.9 % IV SOLN: INTRAVENOUS | Status: DC

## 2022-07-23 MED ORDER — LABETALOL HCL 5 MG/ML IV SOLN
10.0000 mg | INTRAVENOUS | Status: DC | PRN
  Administered 2022-07-25: 10 mg via INTRAVENOUS
  Filled 2022-07-23: qty 4

## 2022-07-23 MED ORDER — DOCUSATE SODIUM 100 MG PO CAPS
100.0000 mg | ORAL_CAPSULE | Freq: Every day | ORAL | Status: DC
  Administered 2022-07-24 – 2022-08-12 (×18): 100 mg via ORAL
  Filled 2022-07-23 (×20): qty 1

## 2022-07-23 MED ORDER — ALUM & MAG HYDROXIDE-SIMETH 200-200-20 MG/5ML PO SUSP: 15.0000 mL | ORAL | Status: DC | PRN

## 2022-07-23 MED ORDER — LABETALOL HCL 5 MG/ML IV SOLN
INTRAVENOUS | Status: DC | PRN
  Administered 2022-07-23 (×4): 5 mg via INTRAVENOUS

## 2022-07-23 MED ORDER — HEPARIN SODIUM (PORCINE) 1000 UNIT/ML IJ SOLN
INTRAMUSCULAR | Status: DC | PRN
  Administered 2022-07-23: 8000 [IU] via INTRAVENOUS

## 2022-07-23 MED ORDER — FENTANYL CITRATE (PF) 100 MCG/2ML IJ SOLN
25.0000 ug | INTRAMUSCULAR | Status: DC | PRN
  Administered 2022-07-23: 50 ug via INTRAVENOUS

## 2022-07-23 MED ORDER — ONDANSETRON HCL 4 MG/2ML IJ SOLN
INTRAMUSCULAR | Status: DC | PRN
  Administered 2022-07-23: 4 mg via INTRAVENOUS

## 2022-07-23 MED ORDER — CEFAZOLIN SODIUM-DEXTROSE 2-4 GM/100ML-% IV SOLN
2.0000 g | Freq: Three times a day (TID) | INTRAVENOUS | Status: AC
  Administered 2022-07-23 (×2): 2 g via INTRAVENOUS
  Filled 2022-07-23 (×2): qty 100

## 2022-07-23 MED ORDER — SUGAMMADEX SODIUM 200 MG/2ML IV SOLN
INTRAVENOUS | Status: DC | PRN
  Administered 2022-07-23: 200 mg via INTRAVENOUS

## 2022-07-23 SURGICAL SUPPLY — 72 items
ADH SKN CLS APL DERMABOND .7 (GAUZE/BANDAGES/DRESSINGS) ×4
ADH SKN CLS LQ APL DERMABOND (GAUZE/BANDAGES/DRESSINGS) ×2
APL PRP STRL LF DISP 70% ISPRP (MISCELLANEOUS) ×4
APPLIER CLIP 11 MED OPEN (CLIP) ×2
APR CLP MED 11 20 MLT OPN (CLIP) ×2
BAG DECANTER FOR FLEXI CONT (MISCELLANEOUS) ×2 IMPLANT
BLADE SURG SZ11 CARB STEEL (BLADE) ×2 IMPLANT
BOOT SUTURE AID YELLOW STND (SUTURE) ×2 IMPLANT
CATH EMBOLECTOMY 3X80 (CATHETERS) IMPLANT
CHLORAPREP W/TINT 26 (MISCELLANEOUS) ×4 IMPLANT
CLIP APPLIE 11 MED OPEN (CLIP) IMPLANT
COVER PROBE FLX POLY STRL (MISCELLANEOUS) IMPLANT
DERMABOND ADVANCED .7 DNX12 (GAUZE/BANDAGES/DRESSINGS) ×4 IMPLANT
DERMABOND ADVANCED .7 DNX6 (GAUZE/BANDAGES/DRESSINGS) IMPLANT
DRAPE INCISE IOBAN 66X45 STRL (DRAPES) ×2 IMPLANT
DRSG OPSITE POSTOP 4X6 (GAUZE/BANDAGES/DRESSINGS) ×4 IMPLANT
ELECT CAUTERY BLADE 6.4 (BLADE) ×4 IMPLANT
ELECT REM PT RETURN 9FT ADLT (ELECTROSURGICAL) ×2
ELECTRODE REM PT RTRN 9FT ADLT (ELECTROSURGICAL) ×4 IMPLANT
GAUZE 4X4 16PLY ~~LOC~~+RFID DBL (SPONGE) ×4 IMPLANT
GLOVE BIO SURGEON STRL SZ7 (GLOVE) ×4 IMPLANT
GLOVE BIOGEL PI IND STRL 7.0 (GLOVE) IMPLANT
GLOVE PROTEXIS LATEX SZ 7.5 (GLOVE) ×4 IMPLANT
GLOVE SURG LATEX 7.5 PF (GLOVE) IMPLANT
GLOVE SURG SYN 6.5 ES PF (GLOVE) ×10 IMPLANT
GLOVE SURG SYN 6.5 PF PI (GLOVE) IMPLANT
GOWN STRL REUS W/ TWL LRG LVL3 (GOWN DISPOSABLE) ×2 IMPLANT
GOWN STRL REUS W/ TWL XL LVL3 (GOWN DISPOSABLE) ×4 IMPLANT
GOWN STRL REUS W/TWL LRG LVL3 (GOWN DISPOSABLE) ×6
GOWN STRL REUS W/TWL XL LVL3 (GOWN DISPOSABLE) ×2
GRAFT PROPATEN W/RING 6X80X60 (Vascular Products) IMPLANT
HEMOSTAT SURGICEL 2X3 (HEMOSTASIS) ×4 IMPLANT
HOLDER FOLEY CATH W/STRAP (MISCELLANEOUS) IMPLANT
IV NS 500ML (IV SOLUTION) ×2
IV NS 500ML BAXH (IV SOLUTION) ×2 IMPLANT
KIT PREVENA INCISION MGT 13 (CANNISTER) IMPLANT
KIT TURNOVER KIT A (KITS) ×2 IMPLANT
LABEL OR SOLS (LABEL) ×2 IMPLANT
LOOP RED MAXI  1X406MM (MISCELLANEOUS) ×6
LOOP VESSEL MAXI 1X406 RED (MISCELLANEOUS) ×6 IMPLANT
LOOP VESSEL MINI 0.8X406 BLUE (MISCELLANEOUS) ×4 IMPLANT
LOOPS BLUE MINI 0.8X406MM (MISCELLANEOUS) ×4
MANIFOLD NEPTUNE II (INSTRUMENTS) ×2 IMPLANT
NS IRRIG 500ML POUR BTL (IV SOLUTION) ×2 IMPLANT
PACK BASIN MAJOR ARMC (MISCELLANEOUS) ×2 IMPLANT
PACK UNIVERSAL (MISCELLANEOUS) ×2 IMPLANT
SPONGE T-LAP 18X18 ~~LOC~~+RFID (SPONGE) ×6 IMPLANT
SUT GTX CV-5 TTC13 DBL (SUTURE) ×4 IMPLANT
SUT MNCRL 4-0 (SUTURE) ×4
SUT MNCRL 4-0 27XMFL (SUTURE) ×4
SUT PROLENE 5 0 RB 1 DA (SUTURE) ×4 IMPLANT
SUT PROLENE 6 0 BV (SUTURE) ×8 IMPLANT
SUT PROLENE 7 0 BV 1 (SUTURE) ×4 IMPLANT
SUT SILK 0 SH 30 (SUTURE) ×2 IMPLANT
SUT SILK 2 0 (SUTURE) ×2
SUT SILK 2 0 SH (SUTURE) IMPLANT
SUT SILK 2-0 18XBRD TIE 12 (SUTURE) ×2 IMPLANT
SUT SILK 3 0 (SUTURE) ×2
SUT SILK 3-0 18XBRD TIE 12 (SUTURE) ×2 IMPLANT
SUT SILK 4 0 (SUTURE) ×4
SUT SILK 4-0 18XBRD TIE 12 (SUTURE) ×2 IMPLANT
SUT VIC AB 2-0 CT1 (SUTURE) ×4 IMPLANT
SUT VIC AB 3-0 SH 27 (SUTURE) ×4
SUT VIC AB 3-0 SH 27X BRD (SUTURE) ×4 IMPLANT
SUT VICRYL+ 3-0 36IN CT-1 (SUTURE) ×4 IMPLANT
SUTURE MNCRL 4-0 27XMF (SUTURE) ×4 IMPLANT
SYR 20ML LL LF (SYRINGE) ×2 IMPLANT
SYR 3ML LL SCALE MARK (SYRINGE) IMPLANT
SYR BULB IRRIG 60ML STRL (SYRINGE) ×2 IMPLANT
TRAP FLUID SMOKE EVACUATOR (MISCELLANEOUS) ×2 IMPLANT
TRAY FOLEY MTR SLVR 16FR STAT (SET/KITS/TRAYS/PACK) ×2 IMPLANT
WATER STERILE IRR 500ML POUR (IV SOLUTION) ×2 IMPLANT

## 2022-07-23 SURGICAL SUPPLY — 81 items
ADH SKN CLS APL DERMABOND .7 (GAUZE/BANDAGES/DRESSINGS) ×2
ADH SKN CLS LQ APL DERMABOND (GAUZE/BANDAGES/DRESSINGS) ×1
AGENT HMST KT MTR STRL THRMB (HEMOSTASIS) ×1
APL PRP STRL LF DISP 70% ISPRP (MISCELLANEOUS) ×1
APL PRP STRL LF ISPRP CHG 10.5 (MISCELLANEOUS) ×3
APPLICATOR CHLORAPREP 10.5 ORG (MISCELLANEOUS) IMPLANT
APPLIER CLIP 11 MED OPEN (CLIP)
APPLIER CLIP 13 LRG OPEN (CLIP)
APPLIER CLIP 9.375 SM OPEN (CLIP)
APR CLP LRG 13 20 CLIP (CLIP)
APR CLP MED 11 20 MLT OPN (CLIP)
APR CLP SM 9.3 20 MLT OPN (CLIP)
BAG DECANTER FOR FLEXI CONT (MISCELLANEOUS) ×1 IMPLANT
BAG ISL LRG 20X20 DRWSTRG (DRAPES) ×1
BAG ISOLATATION DRAPE 20X20 ST (DRAPES) ×1 IMPLANT
BLADE SURG 11 STRL SS (BLADE) IMPLANT
BLADE SURG 15 STRL LF DISP TIS (BLADE) ×1 IMPLANT
BLADE SURG 15 STRL SS (BLADE) ×1
BLADE SURG SZ11 CARB STEEL (BLADE) ×1 IMPLANT
BOOT SUTURE AID YELLOW STND (SUTURE) ×1 IMPLANT
CATH EMBOLECTOMY 5X80 (CATHETERS) IMPLANT
CATH FOGERTY 4X80 WAS (CATHETERS) IMPLANT
CHLORAPREP W/TINT 26 (MISCELLANEOUS) ×1 IMPLANT
CLIP APPLIE 11 MED OPEN (CLIP) IMPLANT
CLIP APPLIE 13 LRG OPEN (CLIP) IMPLANT
CLIP APPLIE 9.375 SM OPEN (CLIP) IMPLANT
DERMABOND ADVANCED .7 DNX12 (GAUZE/BANDAGES/DRESSINGS) ×1 IMPLANT
DERMABOND ADVANCED .7 DNX6 (GAUZE/BANDAGES/DRESSINGS) IMPLANT
DRAPE 3/4 80X56 (DRAPES) IMPLANT
DRAPE INCISE IOBAN 66X45 STRL (DRAPES) ×1 IMPLANT
DRAPE ISOLATE BAG 20X20 STRL (DRAPES) ×1
DRESSING PEEL AND PLC PRVNA 13 (GAUZE/BANDAGES/DRESSINGS) IMPLANT
DRSG PEEL AND PLACE PREVENA 13 (GAUZE/BANDAGES/DRESSINGS) ×1
ELECT CAUTERY BLADE 6.4 (BLADE) ×1 IMPLANT
ELECT REM PT RETURN 9FT ADLT (ELECTROSURGICAL) ×1
ELECTRODE REM PT RTRN 9FT ADLT (ELECTROSURGICAL) ×1 IMPLANT
GAUZE 4X4 16PLY ~~LOC~~+RFID DBL (SPONGE) ×1 IMPLANT
GEL ULTRASOUND 20GR AQUASONIC (MISCELLANEOUS) ×1 IMPLANT
GLOVE BIO SURGEON STRL SZ7 (GLOVE) ×1 IMPLANT
GOWN STRL REUS W/ TWL LRG LVL3 (GOWN DISPOSABLE) ×1 IMPLANT
GOWN STRL REUS W/ TWL XL LVL3 (GOWN DISPOSABLE) ×1 IMPLANT
GOWN STRL REUS W/TWL LRG LVL3 (GOWN DISPOSABLE) ×1
GOWN STRL REUS W/TWL XL LVL3 (GOWN DISPOSABLE) ×1
IV NS 500ML (IV SOLUTION) ×1
IV NS 500ML BAXH (IV SOLUTION) ×1 IMPLANT
KIT TURNOVER KIT A (KITS) ×1 IMPLANT
LABEL OR SOLS (LABEL) ×1 IMPLANT
LOOP RED MAXI  1X406MM (MISCELLANEOUS) ×2
LOOP VESSEL MAXI 1X406 RED (MISCELLANEOUS) ×1 IMPLANT
LOOP VESSEL MINI 0.8X406 BLUE (MISCELLANEOUS) ×2 IMPLANT
LOOPS BLUE MINI 0.8X406MM (MISCELLANEOUS) ×3
MANIFOLD NEPTUNE II (INSTRUMENTS) ×1 IMPLANT
MICROPUNCTURE 5FR NT-SST (MISCELLANEOUS) IMPLANT
NS IRRIG 500ML POUR BTL (IV SOLUTION) ×1 IMPLANT
PACK BASIN MAJOR ARMC (MISCELLANEOUS) ×1 IMPLANT
PACK UNIVERSAL (MISCELLANEOUS) ×1 IMPLANT
PAD PREP 24X41 OB/GYN DISP (PERSONAL CARE ITEMS) ×1 IMPLANT
SPONGE T-LAP 18X18 ~~LOC~~+RFID (SPONGE) ×2 IMPLANT
SURGIFLO W/THROMBIN 8M KIT (HEMOSTASIS) IMPLANT
SUT MNCRL 4-0 (SUTURE) ×1
SUT MNCRL 4-0 27XMFL (SUTURE) ×1
SUT PROLENE 5 0 RB 1 DA (SUTURE) IMPLANT
SUT PROLENE 6 0 BV (SUTURE) ×2 IMPLANT
SUT PROLENE 7 0 BV 1 (SUTURE) ×2 IMPLANT
SUT SILK 2 0 (SUTURE) ×1
SUT SILK 2-0 18XBRD TIE 12 (SUTURE) ×1 IMPLANT
SUT SILK 3 0 (SUTURE) ×1
SUT SILK 3-0 18XBRD TIE 12 (SUTURE) ×1 IMPLANT
SUT SILK 4 0 (SUTURE) ×2
SUT SILK 4-0 18XBRD TIE 12 (SUTURE) ×1 IMPLANT
SUT VIC AB 2-0 CT1 27 (SUTURE) ×2
SUT VIC AB 2-0 CT1 TAPERPNT 27 (SUTURE) ×2 IMPLANT
SUT VIC AB 3-0 SH 27 (SUTURE)
SUT VIC AB 3-0 SH 27X BRD (SUTURE) IMPLANT
SUT VICRYL+ 3-0 36IN CT-1 (SUTURE) ×2 IMPLANT
SUTURE MNCRL 4-0 27XMF (SUTURE) IMPLANT
SYR 20ML LL LF (SYRINGE) ×1 IMPLANT
SYR 3ML LL SCALE MARK (SYRINGE) ×1 IMPLANT
SYR 5ML LL (SYRINGE) ×1 IMPLANT
TRAP FLUID SMOKE EVACUATOR (MISCELLANEOUS) ×1 IMPLANT
WATER STERILE IRR 500ML POUR (IV SOLUTION) ×1 IMPLANT

## 2022-07-23 NOTE — Progress Notes (Signed)
PT Cancellation Note  Patient Details Name: Carla [name] MRN: [id number] DOB: 04-24-1961   Cancelled Treatment:    Reason Eval/Treat Not Completed: Patient not medically ready PT orders received, chart reviewed. Pt noted to be in OR today, not appropriate for PT evaluation today. Will f/u tomorrow.  Lavone Nian, PT, DPT 07/23/22, 1:11 PM  [name] 07/23/2022, 1:11 PM

## 2022-07-23 NOTE — Progress Notes (Signed)
ANTICOAGULATION CONSULT NOTE - Initial Consult  Pharmacy Consult for Heparin  Indication: DVT/Ischemic leg   Allergies  Allergen Reactions   Gabapentin     Tremors    Bactrim [Sulfamethoxazole-Trimethoprim] Itching    Patient Measurements: Height: '5\' 2"'$  (157.5 cm) Weight: 80.7 kg (178 lb) IBW/kg (Calculated) : 50.1 Heparin Dosing Weight: 68.1 kg   Vital Signs: Temp: 98.2 F (36.8 C) (09/30 2254) Temp Source: Oral (09/30 2202) BP: 130/71 (09/30 2254) Pulse Rate: 69 (09/30 2254)  Labs: Recent Labs    07/22/22 0030 07/22/22 0630 07/22/22 0730 07/22/22 1751 07/23/22 0030  HGB 11.9*  --   --   --  10.3*  HCT 38.1  --   --   --  33.7*  PLT 237  --   --   --  197  APTT 26  --  49*  --   --   LABPROT 12.5  --   --   --   --   INR 0.9  --   --   --   --   HEPARINUNFRC 0.12*  --  0.29* 0.20* 0.72*  CREATININE 0.81  --   --   --   --   TROPONINIHS 4 4  --   --   --      Estimated Creatinine Clearance: 70.8 mL/min (by C-G formula based on SCr of 0.81 mg/dL).   Medical History: Past Medical History:  Diagnosis Date   Abdominal aortic atherosclerosis ([place])    a. 05/2017 CTA abd/pelvis: significant atherosclerotic dzs of the infrarenal abd Ao w/ some mural thrombus. No aneurysm or dissection.   Acute focal ischemia of small intestine (HCC)    Acute right-sided low back pain with right-sided sciatica 06/13/2017   AKI (acute kidney injury) ([place]) 07/29/2017   Anemia    Anginal pain ([place])    a. 08/2012 Lexiscan MV: EF 54%, non ischemia/infarct.   Anxiety and depression    Baker's cyst of knee, right    a. 07/2016 U/S: 4.1 x 1.4 x 2.9 cystic structure in R poplitetal fossa.   Bell's palsy    CAD (coronary artery disease)    CHF (congestive heart failure) ([place])    a.) TTE 06/20/2017: EF 60-65%, no rwma, G1DD, no source of cardiac emboli.   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 05/2017 CTA Abd/pelvis: apparent thrombus or embolus in prox SMA (70-90%); b. 05/2017 catheter directed tPA, mechanical thrombectomy, and stenting of the SMA.   Essential hypertension with goal blood pressure less than 140/90 01/19/2016   Gastroesophageal reflux disease 01/19/2016   GERD (gastroesophageal reflux disease)    H/O colectomy    History of 2019 novel coronavirus disease (COVID-19) 11/14/2019   History of kidney stones    Hyperlipidemia    Hypertension    Morbid obesity with BMI of 40.0-44.9, adult ([place])    NSTEMI (non-ST elevated myocardial infarction) ([place]) 06/27/2021   a.) high sensitivity troponins trended: 893 --> 1587 --> 1748 --> 868 --> 735 ng/L   Occlusive mesenteric ischemia ([place]) 06/19/2017   Osteoarthritis    PAD (peripheral artery disease) (HCC)    PAF (paroxysmal atrial fibrillation) (HCC)    a.) CHA2DS2-VASc = 6 (sex, CHF, HTN, DVT x2, aortic plaque). b.)  rate/rhythm maintained on oral metoprolol tartrate; chronically anticoagulated with standard dose apixaban.   Palpitations    Peripheral edema    Personal history of other malignant neoplasm of skin 06/01/2016   Pneumonia    Pre-diabetes    Primary osteoarthritis of both knees 06/13/2017   SBO (small bowel obstruction) ([place]) 08/06/2017   Skin cancer of nose    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HCC)    Vitamin D deficiency     Medications:  Medications Prior to Admission  Medication Sig Dispense Refill Last Dose   amLODipine (NORVASC) 5 MG tablet Take 5 mg by mouth daily.   07/21/2022 at 0800   apixaban (ELIQUIS) 5 MG TABS tablet Take 1 tablet (5 mg total) by mouth 2 (two) times daily. 60 tablet 11 07/21/2022 at 0800   aspirin EC 81 MG tablet Take 81 mg by mouth daily. Swallow whole.   07/21/2022 at 0800   atorvastatin (LIPITOR) 80 MG tablet Take 80 mg by mouth at bedtime.   07/21/2022 at  2000   citalopram (CELEXA) 40 MG tablet Take 1 tablet (40 mg total) by mouth daily. 30 tablet 2 07/21/2022 at 0800   COMBIVENT RESPIMAT 20-100 MCG/ACT AERS respimat    prn at prn   esomeprazole (NEXIUM) 20 MG capsule Take 20 mg by mouth daily.   07/21/2022 at 0800   ferrous sulfate 325 (65 FE) MG tablet Take 325 mg by mouth daily.   07/21/2022 at 0800   furosemide (LASIX) 40 MG tablet Take 60 mg by mouth daily.   07/21/2022 at 0800   isosorbide mononitrate (IMDUR) 60 MG 24 hr tablet Take 60 mg by mouth daily.   07/21/2022 at 0800   metoprolol tartrate (LOPRESSOR) 50 MG tablet Take 50 mg by mouth 2 (two) times daily.   07/21/2022 at 2000   Multiple Vitamin (MULTIVITAMIN WITH MINERALS) TABS tablet Take 1 tablet by mouth daily. 30 tablet 0 07/21/2022 at 0800   omeprazole (PRILOSEC) 20 MG capsule Take 20 mg by mouth 2 (two) times daily before a meal.   07/21/2022 at 2000   ondansetron (ZOFRAN) 4 MG tablet Take 4 mg by mouth every 8 (eight) hours as needed for nausea or vomiting.   prn at prn   oxyCODONE-acetaminophen (PERCOCET/ROXICET) 5-325 MG tablet Take 1 tablet by mouth every 6 (six) hours as needed for moderate pain. 40 tablet 0 prn at prn   pregabalin (LYRICA) 100 MG capsule Take 1 capsule (100 mg total) by mouth 3 (three) times daily. 90 capsule 3 07/21/2022 at 2000   Amino Acids-Protein Hydrolys (PRO-STAT) LIQD Take 30 mLs by mouth in the morning and at bedtime. (Patient not taking: Reported on 07/22/2022)   Not Taking   feeding supplement (ENSURE ENLIVE / ENSURE PLUS) LIQD Take 237 mLs by mouth 2 (two) times daily between meals. 237 mL 12    methocarbamol (ROBAXIN) 500 MG tablet Take 1 tablet (500 mg total) by mouth every 8 (eight) hours as needed for muscle spasms. (Patient not taking: Reported on 07/22/2022) 30 tablet 0 Not Taking    Assessment: Pharmacy consulted to dose heparin in this 61 year old female admitted with ischemic leg.   Pt reports taking Eliquis 5 mg PO BID PTA with last dose on 9/29 @  2000.   However, baseline HL not significantly elevated (0.12).   Assume pt is confused or noncompliant.     CrCl = 70.8 ml/min  Goal of Therapy:  Heparin level 0.3-0.7 units/ml Monitor platelets by anticoagulation  protocol: Yes  Date Time HL Rate/Comment 9/30 0730 0.29 Subtherapeutic/ 1000u + incr from 1100 > 1250 u/hr 9/30 1751 0.20 Subtherapeutic/ 2000u + incr from 1250 > 1550 u/hr 10/01   0030    0.72    Elevated   Plan:  10/1:  HL @ 0030 = 0.72, elevated Will decrease drip rate to 1400 units/hr and recheck HL 6 hrs after rate change.   Chantavia Bazzle D 07/23/2022,1:30 AM

## 2022-07-23 NOTE — Op Note (Addendum)
Patient name: Carla [name] MRN: [id number] DOB: 09/13/1961 Sex: female  07/23/2022 Pre-operative Diagnosis: Ischemic left leg Post-operative diagnosis:  Same Surgeon:  Annamarie Major Procedure:   #1: Left axillary to common femoral artery bypass graft with 6 mm external ring propatent PTFE   #2: Redo left femoral artery exposure   #3: Prevena wound VAC Anesthesia: General Blood Loss: 100 cc Specimens: None  Findings: I was able to pass a #3 Fogarty catheter from the common femoral artery all the way across the ankle.  The anastomosis in the groin was to the distal common femoral artery  Indications: This is a 61 year old female who has undergone multiple attempts at revascularization for limb salvage.  She has had a right above-knee amputation.  She presented to the emergency department with a 7-day history of a painful left leg.  CT scan showed occlusion of her aortoiliac stents.  She is here today for surgical revascularization  Procedure:  The patient was identified in the holding area and taken to [place]  The patient was then placed supine on the table. general anesthesia was administered.  The patient was prepped and draped in the usual sterile fashion.  A time out was called and antibiotics were administered.  The patient's previous longitudinal left groin incision was opened with a 10 blade.  Cautery was used divide the subcutaneous tissue.  I then sharply dissected out the common femoral artery from the inguinal ligament down to the bifurcation.  The profunda and superficial femoral arteries were individually isolated.  Attention was then turned towards the infraclavicular region.  A transverse incision was made 1 fingerbreadth below the clavicle.  Cautery was used divide subcutaneous tissue down to the pectoralis major muscle.  The superior border of the pectoralis muscle was easily visible and so I elected to expose the subclavian artery at this level.  The subclavian artery  had an excellent pulse and was disease-free.  Next, a long curved Gore tunneler was used to create a tunnel posterior to the pectoralis minor muscle which was partially divided coursing along the side going anterior to the iliac crest and into the groin.  A 6 mm external ring propatent PTFE graft was brought through the tunnel.  The patient was then fully heparinized.  After the heparin circulated, the axillary artery was occluded with vascular clamps.  A #11 blade was used to make an arteriotomy which was extended longitudinally.  The graft was then beveled to fit the size of the arteriotomy in a running anastomosis was created with 6-0 Prolene.  Prior to completion, the appropriate flushing maneuvers were performed and the anastomosis was completed.  The graft courses parallel to the artery before going onto the pectoralis minor muscle.  There was no tension on the anastomosis.  Next, the femoral vessels were occluded with Vesseloops.  A #11 blade was used to make an arteriotomy on the anterior surface of the distal common femoral artery.  This was extended longitudinally with Potts scissors.  There was no thrombus within the artery.  There was some antegrade bleeding as well as good bleeding from the profunda and superficial femoral artery.  I passed a #3 Fogarty catheter down the superficial femoral artery.  I went all the way down across the ankle.  No thrombus was evacuated.  The PTFE graft was then beveled to fit the size the arteriotomy in a running anastomosis was created with 6-0 Prolene.  Prior to completion, the appropriate flushing maneuvers were  performed and the anastomosis was completed.  Blood flow was then reestablished to the left leg.  The patient had brisk profunda and superficial femoral artery Doppler signals.  The wounds were irrigated.  The heparin was reversed with 50 mg of protamine.  Once hemostasis was satisfactory, the femoral sheath was reapproximated with 2-0 Vicryl.  The  subcutaneous tissue was then closed with additional layers of 3-0 Vicryl followed by 4-0 Monocryl subcuticular closure.  The axillary incision was closed by reapproximating the fascia with 2-0 Vicryl the subcutaneous tissue with 2-0 Vicryl and the skin with 4 Monocryl.  A Prevena wound VAC was placed in the left groin.  The patient tolerated the procedure well and was successfully extubated and taken recovery in stable condition.   Disposition: To PACU stable.   Theotis Burrow, M.D., [facility] [place] Office: [phone] Pager:  [phone]

## 2022-07-23 NOTE — Anesthesia Procedure Notes (Signed)
Procedure Name: Intubation Date/Time: 07/23/2022 4:08 AM  Performed by: Garner Nash, CRNAPre-anesthesia Checklist: Patient identified, Emergency Drugs available, Suction available and Patient being monitored Patient Re-evaluated:Patient Re-evaluated prior to induction Oxygen Delivery Method: Circle system utilized Preoxygenation: Pre-oxygenation with 100% oxygen Induction Type: IV induction Ventilation: Mask ventilation without difficulty Laryngoscope Size: Mac and 3 Grade View: Grade I Tube type: Oral Tube size: 6.5 mm Number of attempts: 1 Placement Confirmation: ETT inserted through vocal cords under direct vision, positive ETCO2 and breath sounds checked- equal and bilateral Secured at: 20 cm Tube secured with: Tape Dental Injury: Teeth and Oropharynx as per pre-operative assessment

## 2022-07-23 NOTE — Progress Notes (Signed)
ANTICOAGULATION CONSULT NOTE - Initial Consult  Pharmacy Consult for Heparin infusion Indication: DVT/Ischemic leg   Allergies  Allergen Reactions   Gabapentin     Tremors    Bactrim [Sulfamethoxazole-Trimethoprim] Itching    Patient Measurements: Height: '5\' 2"'$  (157.5 cm) Weight: 80.7 kg (178 lb) IBW/kg (Calculated) : 50.1 Heparin Dosing Weight: 68.1 kg   Vital Signs: Temp: 96.7 F (35.9 C) (10/01 1147) BP: 151/97 (10/01 1610) Pulse Rate: 103 (10/01 1610)  Labs: Recent Labs    07/22/22 0030 07/22/22 0630 07/22/22 0730 07/22/22 1751 07/23/22 0030 07/23/22 1450  HGB 11.9*  --   --   --  10.3*  --   HCT 38.1  --   --   --  33.7*  --   PLT 237  --   --   --  197  --   APTT 26  --  49*  --   --  162*  LABPROT 12.5  --   --   --   --   --   INR 0.9  --   --   --   --   --   HEPARINUNFRC 0.12*  --  0.29* 0.20* 0.72*  --   CREATININE 0.81  --   --   --   --   --   TROPONINIHS 4 4  --   --   --   --      Estimated Creatinine Clearance: 70.8 mL/min (by C-G formula based on SCr of 0.81 mg/dL).   Medical History: Past Medical History:  Diagnosis Date   Abdominal aortic atherosclerosis ([place])    a. 05/2017 CTA abd/pelvis: significant atherosclerotic dzs of the infrarenal abd Ao w/ some mural thrombus. No aneurysm or dissection.   Acute focal ischemia of small intestine (HCC)    Acute right-sided low back pain with right-sided sciatica 06/13/2017   AKI (acute kidney injury) ([place]) 07/29/2017   Anemia    Anginal pain ([place])    a. 08/2012 Lexiscan MV: EF 54%, non ischemia/infarct.   Anxiety and depression    Baker's cyst of knee, right    a. 07/2016 U/S: 4.1 x 1.4 x 2.9 cystic structure in R poplitetal fossa.   Bell's palsy    CAD (coronary artery disease)    CHF (congestive heart failure) ([place])    a.) TTE 06/20/2017: EF 60-65%, no rwma, G1DD, no source of cardiac emboli.   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 05/2017 CTA Abd/pelvis: apparent thrombus or embolus in prox SMA (70-90%); b. 05/2017 catheter directed tPA, mechanical thrombectomy, and stenting of the SMA.   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 b.) rate/rhythm maintained on oral metoprolol tartrate; chronically anticoagulated with standard dose apixaban.   Palpitations    Peripheral edema    Personal history of other malignant neoplasm of skin 06/01/2016   Pneumonia    Pre-diabetes    Primary osteoarthritis of both knees 06/13/2017   SBO (small bowel obstruction) ([place]) 08/06/2017   Skin cancer of nose    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HCC)    Vitamin D deficiency     Medications:  Medications Prior to Admission  Medication Sig Dispense Refill Last Dose   amLODipine (NORVASC) 5 MG tablet Take 5 mg by mouth daily.   07/21/2022 at 0800   apixaban (ELIQUIS) 5 MG TABS tablet Take 1 tablet (5 mg total) by mouth 2 (two) times daily. 60 tablet 11 07/21/2022 at 0800   aspirin EC 81 MG tablet Take 81 mg by mouth daily. Swallow whole.   07/21/2022 at 0800   atorvastatin (LIPITOR) 80 MG tablet Take 80 mg by  mouth at bedtime.   07/21/2022 at 2000   citalopram (CELEXA) 40 MG tablet Take 1 tablet (40 mg total) by mouth daily. 30 tablet 2 07/21/2022 at 0800   COMBIVENT RESPIMAT 20-100 MCG/ACT AERS respimat    prn at prn   esomeprazole (NEXIUM) 20 MG capsule Take 20 mg by mouth daily.   07/21/2022 at 0800   ferrous sulfate 325 (65 FE) MG tablet Take 325 mg by mouth daily.   07/21/2022 at 0800   furosemide (LASIX) 40 MG tablet Take 60 mg by mouth daily.   07/21/2022 at 0800   isosorbide mononitrate (IMDUR) 60 MG 24 hr tablet Take 60 mg by mouth daily.   07/21/2022 at 0800   metoprolol tartrate (LOPRESSOR) 50 MG tablet Take 50 mg by mouth 2 (two) times daily.   07/21/2022 at 2000   Multiple Vitamin (MULTIVITAMIN WITH MINERALS) TABS tablet Take 1 tablet by mouth daily. 30 tablet 0 07/21/2022 at 0800   omeprazole (PRILOSEC) 20 MG capsule Take 20 mg by mouth 2 (two) times daily before a meal.   07/21/2022 at 2000   ondansetron (ZOFRAN) 4 MG tablet Take 4 mg by mouth every 8 (eight) hours as needed for nausea or vomiting.   prn at prn   oxyCODONE-acetaminophen (PERCOCET/ROXICET) 5-325 MG tablet Take 1 tablet by mouth every 6 (six) hours as needed for moderate pain. 40 tablet 0 prn at prn   pregabalin (LYRICA) 100 MG capsule Take 1 capsule (100 mg total) by mouth 3 (three) times daily. 90 capsule 3 07/21/2022 at 2000   Amino Acids-Protein Hydrolys (PRO-STAT) LIQD Take 30 mLs by mouth in the morning and at bedtime. (Patient not taking: Reported on 07/22/2022)   Not Taking   feeding supplement (ENSURE ENLIVE / ENSURE PLUS) LIQD Take 237 mLs by mouth 2 (two) times daily between meals. 237 mL 12    methocarbamol (ROBAXIN) 500 MG tablet Take 1 tablet (500 mg total) by mouth every 8 (eight) hours as needed for muscle spasms. (Patient not taking: Reported on 07/22/2022) 30 tablet 0 Not Taking    Assessment: Pharmacy consulted to dose heparin in this 61 year old female admitted with ischemic leg.   Pt reports taking Eliquis 5 mg PO  BID PTA with last dose on 9/29 @ 2000.   However, baseline HL not significantly elevated (0.12).   Assume pt is confused or noncompliant.  Goal of Therapy:  Heparin level 0.5 - 1.0 units/mL per vascular   Monitor platelets by anticoagulation protocol: Yes  Date Time HL Rate/Comment 9/30 0730 0.29 Subtherapeutic/ 1000u + incr from 1100 > 1250 u/hr 9/30 1751 0.20 Subtherapeutic/ 2000u + incr from 1250 > 1550 u/hr 10/01   0030    0.72    Elevated 10/01  1600 >1.10 Supratherapeutic (2/2 8000u bolus) / rate increased from 1400> 1800  Documentation:  Critically elevated aPTT at 162 with correlating anti-xa level of >1.10 s/p thrombectomy on 10/1 at 1600. Vascular believes this may be secondary to heparin 8,000 unit bolus given at 1400 during procedure. Heparin infusion rate was increased from 1400 to 1800 units/hr. Instructed by vascular surgeon to continue heparin infusion rate as is. Vascular also requests that we refrain from holding infusion. Goal heparin level increased to 0.5 - 1.0 units/mL. If heparin level supratherapeutic, only decrease rate.  Plan:  Continue current rate at 1800units/hr per vascular instruction a Check anti-Xa level at 2230 Continue to monitor H&H and platelets daily while on heparin gtt.  Darrick Penna 07/23/2022,4:46 PM

## 2022-07-23 NOTE — Progress Notes (Signed)
Vascular surgeon at bedside on pt arrival to unit. He was unable to doppler left lower extremity distal pulses. Per MD extremity may need to be warmed in order to doppler pulses. Multiple warm blankets applied to extremity as well as bear hugger applied over rest of patient. Per MD to monitor color and temp of extremity for changes and patient's stated pain level as pulses may be difficult to doppler at this time.

## 2022-07-23 NOTE — Progress Notes (Signed)
ANTICOAGULATION CONSULT NOTE - Initial Consult  Pharmacy Consult for Heparin infusion Indication: DVT/Ischemic leg   Allergies  Allergen Reactions   Gabapentin     Tremors    Bactrim [Sulfamethoxazole-Trimethoprim] Itching    Patient Measurements: Height: '5\' 2"'$  (157.5 cm) Weight: 80.7 kg (178 lb) IBW/kg (Calculated) : 50.1 Heparin Dosing Weight: 68.1 kg   Vital Signs: Temp: 98 F (36.7 C) (10/01 2000) Temp Source: Oral (10/01 2000) BP: 109/77 (10/01 2000) Pulse Rate: 108 (10/01 2000)  Labs: Recent Labs    07/22/22 0030 07/22/22 0630 07/22/22 0730 07/22/22 1751 07/23/22 0030 07/23/22 1450 07/23/22 1719 07/23/22 2210  HGB 11.9*  --   --   --  10.3*  --  10.6*  --   HCT 38.1  --   --   --  33.7*  --  34.8*  --   PLT 237  --   --   --  197  --  167  --   APTT 26  --  49*  --   --  162*  --   --   LABPROT 12.5  --   --   --   --   --   --   --   INR 0.9  --   --   --   --   --   --   --   HEPARINUNFRC 0.12*  --  0.29*   < > 0.72* >1.10*  --  >1.10*  CREATININE 0.81  --   --   --   --   --  0.86  --   TROPONINIHS 4 4  --   --   --   --   --   --    < > = values in this interval not displayed.     Estimated Creatinine Clearance: 66.7 mL/min (by C-G formula based on SCr of 0.86 mg/dL).   Medical History: Past Medical History:  Diagnosis Date   Abdominal aortic atherosclerosis ([place])    a. 05/2017 CTA abd/pelvis: significant atherosclerotic dzs of the infrarenal abd Ao w/ some mural thrombus. No aneurysm or dissection.   Acute focal ischemia of small intestine (HCC)    Acute right-sided low back pain with right-sided sciatica 06/13/2017   AKI (acute kidney injury) ([place]) 07/29/2017   Anemia    Anginal pain ([place])    a. 08/2012 Lexiscan MV: EF 54%, non ischemia/infarct.   Anxiety and depression    Baker's cyst of knee, right    a. 07/2016 U/S: 4.1 x 1.4 x 2.9 cystic structure in R poplitetal fossa.   Bell's palsy    CAD (coronary artery disease)    CHF  (congestive heart failure) ([place])    a.) TTE 06/20/2017: EF 60-65%, no rwma, G1DD, no source of cardiac emboli.   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 05/2017 CTA Abd/pelvis: apparent thrombus or embolus in prox SMA (70-90%); b. 05/2017 catheter directed tPA, mechanical thrombectomy, and stenting of the SMA.   Essential hypertension with goal blood pressure less than 140/90 01/19/2016   Gastroesophageal reflux disease 01/19/2016   GERD (gastroesophageal reflux disease)    H/O colectomy    History of 2019 novel coronavirus disease (COVID-19) 11/14/2019   History of kidney stones  Hyperlipidemia    Hypertension    Morbid obesity with BMI of 40.0-44.9, adult (HCC)    NSTEMI (non-ST elevated myocardial infarction) ([place]) 06/27/2021   a.) high sensitivity troponins trended: 893 --> 1587 --> 1748 --> 868 --> 735 ng/L   Occlusive mesenteric ischemia (HCC) 06/19/2017   Osteoarthritis    PAD (peripheral artery disease) (HCC)    PAF (paroxysmal atrial fibrillation) (HCC)    a.) CHA2DS2-VASc = 6 (sex, CHF, HTN, DVT x2, aortic plaque). b.) rate/rhythm maintained on oral metoprolol tartrate; chronically anticoagulated with standard dose apixaban.   Palpitations    Peripheral edema    Personal history of other malignant neoplasm of skin 06/01/2016   Pneumonia    Pre-diabetes    Primary osteoarthritis of both knees 06/13/2017   SBO (small bowel obstruction) ([place]) 08/06/2017   Skin cancer of nose    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HCC)    Vitamin D deficiency     Medications:  Medications Prior to Admission  Medication Sig Dispense Refill Last Dose   amLODipine (NORVASC) 5 MG tablet Take 5 mg by mouth daily.   07/21/2022 at 0800   apixaban (ELIQUIS) 5 MG TABS tablet Take 1 tablet (5  mg total) by mouth 2 (two) times daily. 60 tablet 11 07/21/2022 at 0800   aspirin EC 81 MG tablet Take 81 mg by mouth daily. Swallow whole.   07/21/2022 at 0800   atorvastatin (LIPITOR) 80 MG tablet Take 80 mg by mouth at bedtime.   07/21/2022 at 2000   citalopram (CELEXA) 40 MG tablet Take 1 tablet (40 mg total) by mouth daily. 30 tablet 2 07/21/2022 at 0800   COMBIVENT RESPIMAT 20-100 MCG/ACT AERS respimat    prn at prn   esomeprazole (NEXIUM) 20 MG capsule Take 20 mg by mouth daily.   07/21/2022 at 0800   ferrous sulfate 325 (65 FE) MG tablet Take 325 mg by mouth daily.   07/21/2022 at 0800   furosemide (LASIX) 40 MG tablet Take 60 mg by mouth daily.   07/21/2022 at 0800   isosorbide mononitrate (IMDUR) 60 MG 24 hr tablet Take 60 mg by mouth daily.   07/21/2022 at 0800   metoprolol tartrate (LOPRESSOR) 50 MG tablet Take 50 mg by mouth 2 (two) times daily.   07/21/2022 at 2000   Multiple Vitamin (MULTIVITAMIN WITH MINERALS) TABS tablet Take 1 tablet by mouth daily. 30 tablet 0 07/21/2022 at 0800   omeprazole (PRILOSEC) 20 MG capsule Take 20 mg by mouth 2 (two) times daily before a meal.   07/21/2022 at 2000   ondansetron (ZOFRAN) 4 MG tablet Take 4 mg by mouth every 8 (eight) hours as needed for nausea or vomiting.   prn at prn   oxyCODONE-acetaminophen (PERCOCET/ROXICET) 5-325 MG tablet Take 1 tablet by mouth every 6 (six) hours as needed for moderate pain. 40 tablet 0 prn at prn   pregabalin (LYRICA) 100 MG capsule Take 1 capsule (100 mg total) by mouth 3 (three) times daily. 90 capsule 3 07/21/2022 at 2000   Amino Acids-Protein Hydrolys (PRO-STAT) LIQD Take 30 mLs by mouth in the morning and at bedtime. (Patient not taking: Reported on 07/22/2022)   Not Taking   feeding supplement (ENSURE ENLIVE / ENSURE PLUS) LIQD Take 237 mLs by mouth 2 (two) times daily between meals. 237 mL 12    methocarbamol (ROBAXIN) 500 MG tablet Take 1 tablet (500 mg total) by mouth every 8 (eight) hours as needed for muscle  spasms. (Patient not  taking: Reported on 07/22/2022) 30 tablet 0 Not Taking    Assessment: Pharmacy consulted to dose heparin in this 61 year old female admitted with ischemic leg.   Pt reports taking Eliquis 5 mg PO BID PTA with last dose on 9/29 @ 2000.   However, baseline HL not significantly elevated (0.12).   Assume pt is confused or noncompliant.   Goal of Therapy:  Heparin level 0.5 - 1.0 units/mL per vascular   Monitor platelets by anticoagulation protocol: Yes  Date Time HL Rate/Comment 9/30 0730 0.29 Subtherapeutic/ 1000u + incr from 1100 > 1250 u/hr 9/30 1751 0.20 Subtherapeutic/ 2000u + incr from 1250 > 1550 u/hr 10/01   0030    0.72    Elevated 10/01  1600 >1.10 Supratherapeutic (2/2 8000u bolus) / rate increased from 1400> 1800 10/01   2210    >1.10  Supratherapeutic X 2 @ 1800 units/hr   Documentation:  Critically elevated aPTT at 162 with correlating anti-xa level of >1.10 s/p thrombectomy on 10/1 at 1600. Vascular believes this may be secondary to heparin 8,000 unit bolus given at 1400 during procedure. Heparin infusion rate was increased from 1400 to 1800 units/hr. Instructed by vascular surgeon to continue heparin infusion rate as is. Vascular also requests that we refrain from holding infusion. Goal heparin level increased to 0.5 - 1.0 units/mL. If heparin level supratherapeutic, only decrease rate.  Plan:  10/1:  HL @ 2210 = >1.10, supratherapeutic X 2 Will decrease heparin drip rate to 1600 units/hr and recheck HL 6 hrs after rate change.   Lanaya Bennis D 07/23/2022,10:43 PM

## 2022-07-23 NOTE — Progress Notes (Signed)
PROGRESS NOTE    Carla [name]  LZJ:[id number] DOB: 10-19-1961 DOA: 07/22/2022 PCP: [facility]    Brief Narrative:  Carla [name] is a 61 y.o. female with medical history significant for peripheral arterial disease s/p right AKA, chronic diastolic dysfunction CHF, anxiety and depression, COPD, paroxysmal A-fib on chronic anticoagulation therapy, who presents to the ED with left leg pain for the past 1 week but has been progressively worsening.   CTA found with ischemic LLE. See cta results below  10/1 pt went to OR, s/p vascular procedure, post OR woke up c/o leg pain, mottled leg, went back to OR s/p thrombectomy of Left axillary femoral bypass graft with revision. Transferred to ICU.    Consultants:  Vascular surgery  Procedures:  CTA VASCULAR   Occlusion of the aorto bi-iliac stents with distal reconstitution of the external iliac/common femoral arteries.   Patent three-vessel runoff of the left lower extremity to the level of the calf where there is non opacification of the distal lower extremity arteries. This may be due to slow flow however occlusion is not excluded.   Occluded SMA stent at the origin with distal reconstitution.   NON-VASCULAR   Right above-the-knee amputation. At the site of amputation there is a fluid collection concerning for abscess.   Large ventral abdominal wall hernia containing nonobstructive loops of bowel.       Antimicrobials:  Cefazolin x3 doses    Subjective: Just came out of surgery. Sleeping and snoring  Objective: Vitals:   07/23/22 1315 07/23/22 1610 07/23/22 1630 07/23/22 1658  BP: 129/82 (!) 151/97  117/77  Pulse:  (!) 103 (!) 108 (!) 108  Resp:  12 15 (!) 4  Temp:    97.6 F (36.4 C)  TempSrc:    Axillary  SpO2:  95% 94% 95%  Weight:      Height:        Intake/Output Summary (Last 24 hours) at 07/23/2022 1701 Last data filed at 07/23/2022 1550 Gross per 24 hour  Intake 2500 ml  Output 2100  ml  Net 400 ml   Filed Weights   07/22/22 0029  Weight: 80.7 kg    Examination: Calm, snoring.  Lt upper  chest wound close.  Decrease bs, no wheezing Reg s1/s2 no gallop Distended, +bs, soft Lt wound vac.  Foley in place Warm blankets and heater in place Unable to do neuro exam Mood and affect appropriate in current setting     Data Reviewed: I have personally reviewed following labs and imaging studies  CBC: Recent Labs  Lab 07/22/22 0030 07/23/22 0030  WBC 8.3 6.3  NEUTROABS 4.8  --   HGB 11.9* 10.3*  HCT 38.1 33.7*  MCV 88.4 90.6  PLT 237 024   Basic Metabolic Panel: Recent Labs  Lab 07/22/22 0030  NA 143  K 3.9  CL 108  CO2 25  GLUCOSE 102*  BUN 12  CREATININE 0.81  CALCIUM 8.9   GFR: Estimated Creatinine Clearance: 70.8 mL/min (by C-G formula based on SCr of 0.81 mg/dL). Liver Function Tests: Recent Labs  Lab 07/22/22 0030  AST 13*  ALT 10  ALKPHOS 90  BILITOT 0.5  PROT 6.7  ALBUMIN 3.1*   No results for input(s): "LIPASE", "AMYLASE" in the last 168 hours. No results for input(s): "AMMONIA" in the last 168 hours. Coagulation Profile: Recent Labs  Lab 07/22/22 0030  INR 0.9   Cardiac Enzymes: No results for input(s): "CKTOTAL", "CKMB", "CKMBINDEX", "TROPONINI" in  the last 168 hours. BNP (last 3 results) No results for input(s): "PROBNP" in the last 8760 hours. HbA1C: No results for input(s): "HGBA1C" in the last 72 hours. CBG: Recent Labs  Lab 07/23/22 1616  GLUCAP 207*   Lipid Profile: No results for input(s): "CHOL", "HDL", "LDLCALC", "TRIG", "CHOLHDL", "LDLDIRECT" in the last 72 hours. Thyroid Function Tests: No results for input(s): "TSH", "T4TOTAL", "FREET4", "T3FREE", "THYROIDAB" in the last 72 hours. Anemia Panel: No results for input(s): "VITAMINB12", "FOLATE", "FERRITIN", "TIBC", "IRON", "RETICCTPCT" in the last 72 hours. Sepsis Labs: No results for input(s): "PROCALCITON", "LATICACIDVEN" in the last 168  hours.  Recent Results (from the past 240 hour(s))  MRSA Next Gen by PCR, Nasal     Status: Abnormal   Collection Time: 07/23/22 12:17 AM   Specimen: Nasal Mucosa; Nasal Swab  Result Value Ref Range Status   MRSA by PCR Next Gen DETECTED (A) NOT DETECTED Final    Comment: RESULT CALLED TO, READ BACK BY AND VERIFIED WITH: Clide Cliff @ [phone] on 07/23/2022 by CAF (NOTE) The GeneXpert MRSA Assay (FDA approved for NASAL specimens only), is one component of a comprehensive MRSA colonization surveillance program. It is not intended to diagnose MRSA infection nor to guide or monitor treatment for MRSA infections. Test performance is not FDA approved in patients less than 28 years old. Performed at [facility], [address] [place], [place] [address]          Radiology Studies: CT ANGIO AO+BIFEM W & OR WO CONTRAST  Result Date: 07/22/2022 CLINICAL DATA:  Arterial embolism lower extremity EXAM: CT ANGIOGRAPHY OF ABDOMINAL AORTA WITH ILIOFEMORAL RUNOFF TECHNIQUE: Multidetector CT imaging of the abdomen, pelvis and lower extremities was performed using the standard protocol during bolus administration of intravenous contrast. Multiplanar CT image reconstructions and MIPs were obtained to evaluate the vascular anatomy. RADIATION DOSE REDUCTION: This exam was performed according to the departmental dose-optimization program which includes automated exposure control, adjustment of the mA and/or kV according to patient size and/or use of iterative reconstruction technique. CONTRAST:  160m OMNIPAQUE IOHEXOL 350 MG/ML SOLN COMPARISON:  None Available. CT abdomen and pelvis 03/21/2022 FINDINGS: VASCULAR Aorta: Aorto bi iliac stent is occluded. The aorta is otherwise normal in caliber. Celiac: Patent without dissection or aneurysm. SMA: The SMA stent is occluded at the origin. With distal reconstitution. Renals: Patent bilateral renal arteries without aneurysm or dissection. IMA: Not  visualized and likely occluded. RIGHT Lower Extremity Inflow: The right common femoral artery stent is occluded with thrombus extending into the right external iliac artery. The right internal iliac artery is likely occluded at its origin with distal reconstitution. Reconstitution of the distal external iliac artery. Outflow: The common femoral, profunda femoral, and superficial femoral arteries are patent and likely supplied from collateral flow. Runoff: Not applicable.  Right above the knee amputation. LEFT Lower Extremity Inflow: The common iliac stent external iliac, and proximal internal iliac are occluded. Distal reconstitution of the internal iliac. Outflow: Reconstitution of the common femoral artery. The common femoral, profunda femoral, superficial femoral, and popliteal arteries are patent. Runoff: Three-vessel runoff is patent to a 12-13 cm above the ankle in the calf. At this point the vessels are nonopacified possibly due to slow flow though occlusion is not excluded. Veins: No obvious venous abnormality within the limitations of this arterial phase study. Review of the MIP images confirms the above findings. NON-VASCULAR Lower chest: No acute abnormality. Hepatobiliary: Cholecystectomy. No suspicious focal hepatic lesion. No biliary dilation. Pancreas:  Unremarkable. Spleen: Unremarkable. Adrenals/Urinary Tract: Adrenal glands are unremarkable. Kidneys are normal, without renal calculi, focal lesion, or hydronephrosis. Bladder is unremarkable. Stomach/Bowel: Distended stomach. Normal caliber large and small bowel. Colonic diverticulosis without diverticulitis. Herniation of small and large bowel into the large ventral abdominal wall hernia. No evidence of obstruction or strangulation. Bowel anastomosis is present within the hernia sac. Lymphatic: No suspicious adenopathy. Reproductive: Hysterectomy.  No adnexal masses. Other: No free intraperitoneal fluid or gas. Musculoskeletal: Within the distal  right thigh at the site of amputation there is a 5.1 x 4.0 cm fluid collection with peripheral enhancement. Edema within the left foot. No acute osseous abnormality. 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 Patent three-vessel runoff of the left lower extremity to the level of the calf where there is non opacification of the distal lower extremity arteries. This may be due to slow flow however occlusion is not excluded. Occluded SMA stent at the origin with distal reconstitution. NON-VASCULAR Right above-the-knee amputation. At the site of amputation there is a fluid collection concerning for abscess. Large ventral abdominal wall hernia containing nonobstructive loops of bowel. Emergent results were called by telephone at the time of interpretation on 07/22/2022 at 1:14 am to provider JADE SUNG , who verbally acknowledged these results. Electronically Signed   By: Placido Sou M.D.   On: 07/22/2022 01:27        Scheduled Meds:  [START ON 07/24/2022] aspirin EC  81 mg Oral Q0600   atorvastatin  80 mg Oral QHS   Chlorhexidine Gluconate Cloth  6 each Topical Daily   citalopram  40 mg Oral Daily   [START ON 07/24/2022] docusate sodium  100 mg Oral Daily   isosorbide mononitrate  60 mg Oral Daily   mupirocin ointment  1 Application Nasal BID   pantoprazole  40 mg Oral Daily   pantoprazole  40 mg Oral Daily   pregabalin  100 mg Oral TID   Continuous Infusions:  sodium chloride      ceFAZolin (ANCEF) IV 2 g (07/23/22 1652)   dextrose 5 % and 0.9% NaCl 50 mL/hr at 07/23/22 1621   heparin 1,800 Units/hr (07/23/22 1647)   magnesium sulfate bolus IVPB      Assessment & Plan:   Principal Problem:   Critical limb ischemia of left lower extremity (HCC) Active Problems:   PAF (paroxysmal atrial fibrillation) (HCC)   Chronic diastolic CHF (congestive heart failure) (HCC)   * Critical limb ischemia of left  lower extremity (HCC) Peripheral artery disease s/p right BKA  on heparin gtt, eliquis held S/p OR initially for Left axillary to common femoral artery bypass graft, Prevena wound VAC placed .  Post surgery patient had left foot pain and mottled leg was taken to the OR again , now status post thrombectomy of left axillofemoral bypass graft Transfer to ICU Primary management per vascular     PAF (paroxysmal atrial fibrillation) (HCC)  on heparin drip  Eliquis on hold  Continue metoprolol if BP allows     Chronic diastolic CHF (congestive heart failure) (HCC) Compensated Continue beta-blockers and epidural  DVT prophylaxis: Heparin infusion Code Status: Full Family Communication: None at bedside Disposition Plan: TBD Status is: Inpatient Remains inpatient appropriate because: IV treatment.  Went for surgery today.      LOS: 1 day   Time spent: 35 min    Nolberto Hanlon, MD [facility] Pager [phone]  If 7PM-7AM, please  contact night-coverage 07/23/2022, 5:01 PM

## 2022-07-23 NOTE — Op Note (Signed)
Patient name: Carla [name] MRN: [id number] DOB: 19-Sep-1961 Sex: female  07/23/2022 Pre-operative Diagnosis: Ischemic left leg Post-operative diagnosis:  Same Surgeon:  Annamarie Major Procedure:   #1 thrombectomy of left axillary femoral bypass graft with revision Anesthesia:  General Blood Loss:  500 cc Specimens: Thrombus to pathology  Findings: The axillary femoral bypass graft was occluded.  Thrombus extended into the common femoral artery however the profunda and superficial femoral arteries remain patent.  Some irregular appearing thrombus was recovered from the bypass graft that was sent for pathology.  After performing a thrombectomy, I had created some redundancy in the graft and so I resected a few centimeters and performed a primary end-to-end anastomosis  Indications: Earlier today, the patient underwent a left axillary to femoral bypass graft with 6 mm PTFE for ischemia.  She was doing well in the PACU initially with posterior tibial and peroneal Doppler signals.  However over the course of the next hour or so, she developed mottling of her foot as well as pain.  I felt that she needed to go back to the operating room for exploration.  Procedure:  The patient was identified in the holding area and taken to [place]  The patient was then placed supine on the table. general anesthesia was administered.  The patient was prepped and draped in the usual sterile fashion.  A time out was called and antibiotics were administered.  I initially began by opening the groin incision.  I encircled the femoral vessels with Vesseloops.  There was no pulse within the graft.  The patient was fully heparinized.  I made a graftotomy on the anterior surface of the distal anastomosis.  There was a trickle of blood flow coming through the graft.  I then proceeded to perform thrombectomy of the graft with a #4 Fogarty catheter.  This easily went across the proximal anastomosis.  Some irregular whitish  thrombus was evacuated which was sent to pathology.  Several additional passes were made until a negative pass was performed.  I then tried a final time to pass the Fogarty up however this time at about 15 cm, I could not advance the Fogarty.  I tried to get a 5 Fogarty to go but it would not.  I felt that may had created a redundancy and kink with the thrombectomy.  Therefore, I felt it was necessary to open the axillary incision.  The axillary incision was then opened and the artery was exposed and encircled with a vessel loop.  I then grabbed the graft and was able to make sure there were no further areas that were redundant however there was a area in the groin that I felt needed to be resected.  Before doing this I passed a #4 Fogarty catheter down the profunda femoral artery without evacuating thrombus and also down to the ankle without evacuating thrombus.  There was good backbleeding from both of these vessels.  I then closed the graftotomy site with a running 6-0 Prolene.  I then proceeded to transect the redundant portion of the graft, within the groin incision.  A end to end anastomosis was then performed between the 2 ends of the graft with a running 6-0 Prolene.  Blood flow was then reestablished to the leg.  I was unable to hear a Doppler signal at the foot however there were brisk profunda and superficial femoral Doppler signals and a palpable pulse within the graft.  I suspected that this was related  to the Doppler, however I wanted to make sure we have done everything to keep this from happening again and so I went up into the axillary incision.  The subclavian artery was occluded.  I opened the graft with a #11 blade.  I was easily able to pass a #4 Fogarty catheter across the anastomosis in both directions.  There was no thrombus.  The inflow was excellent.  I then closed the graftotomy with a 6-0 Prolene.  Again there was a palpable pulse within the graft in the groin and brisk Doppler signals  in the profunda superficial femoral artery however again I could not get pedal signals.  Again I felt this was related to the Doppler as this was a similar scenario with the original procedure and the patient had a Doppler posterior tibial and peroneal signal in the recovery area.  With this I was satisfied with our results.  I elected not to reverse her heparin.  The patient was started on a heparin drip at 1400 units per hour.  Once hemostasis was achieved, the femoral sheath was reapproximated 2-0 Vicryl.  The subcutaneous tissue were then closed with additional layers of Vicryl followed by 4-0 Monocryl in the skin.  In the infraclavicular incision, the fascia was reapproximated with 2-0 Vicryl.  The subcutaneous tissue was closed with an additional layer of 2-0 Vicryl followed by subcuticular closure with 5-0 Monocryl.  The patient tolerated the procedure well was taken to the ICU in stable condition.   Disposition: To ICU stable   V. Annamarie Major, M.D., [facility] [place] Office: [phone] Pager:  [phone]

## 2022-07-23 NOTE — Progress Notes (Addendum)
Status post left axillary femoral bypass graft Patient is resting comfortably in PACU. Incisions are clean dry and intact Doppler signals are obtained in the posterior tibial and peroneal artery. Patient is stable for transfer to the ICU  Carla [name]

## 2022-07-23 NOTE — Anesthesia Preprocedure Evaluation (Signed)
Anesthesia Evaluation  Patient identified by MRN, date of birth, ID band Patient awake    Reviewed: Allergy & Precautions, NPO status , Patient's Chart, lab work & pertinent test results  History of Anesthesia Complications Negative for: history of anesthetic complications  Airway Mallampati: III  TM Distance: <3 FB Neck ROM: full    Dental  (+) Missing   Pulmonary shortness of breath, COPD, former smoker,    Pulmonary exam normal        Cardiovascular hypertension, (-) angina+ CAD, + Past MI (NSTEMI 06/2021), + Peripheral Vascular Disease and +CHF  Normal cardiovascular exam+ dysrhythmias Atrial Fibrillation   08/2012 Lexiscan MV: EF 54%, non ischemia/infarct   05/2017 CTA Abd/pelvis: apparent thrombus or embolus in prox SMA (70-90%); b. 05/2017 catheter directed tPA, mechanical thrombectomy, and stenting of the SMA.   TTE 06/20/2017: EF 60-65%, no rwma, G1DD, no source of cardiac emboli   Neuro/Psych PSYCHIATRIC DISORDERS Anxiety Depression Chronic pain syndrome  Neuromuscular disease    GI/Hepatic negative GI ROS, Neg liver ROS, GERD  Controlled,  Endo/Other  negative endocrine ROSMorbid obesity  Renal/GU Renal disease     Musculoskeletal  (+) Arthritis ,   Abdominal (+) + obese,   Peds  Hematology negative hematology ROS (+) Blood dyscrasia, anemia ,   Anesthesia Other Findings Past Medical History: No date: Abdominal aortic atherosclerosis ([place])     Comment:  a. 05/2017 CTA abd/pelvis: significant atherosclerotic               dzs of the infrarenal abd Ao w/ some mural thrombus. No               aneurysm or dissection. No date: Acute focal ischemia of small intestine ([place]) 06/13/2017: Acute right-sided low back pain with right-sided sciatica 07/29/2017: AKI (acute kidney injury) ([place]) No date: Anemia No date: Anginal pain ([place])     Comment:  a. 08/2012 Lexiscan MV: EF 54%, non ischemia/infarct. No date:  Anxiety and depression No date: Baker's cyst of knee, right     Comment:  a. 07/2016 U/S: 4.1 x 1.4 x 2.9 cystic structure in R               poplitetal fossa. No date: Bell's palsy No date: CAD (coronary artery disease) No date: CHF (congestive heart failure) ([place])     Comment:  a.) TTE 06/20/2017: EF 60-65%, no rwma, G1DD, no source               of cardiac emboli. No date: Chronic anticoagulation     Comment:  a.) apixaban No date: Chronic idiopathic constipation No date: Chronic mesenteric ischemia (HCC) No date: Chronic pain No date: COPD (chronic obstructive pulmonary disease) (HCC) No date: Dyspnea No date: 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[place])     Comment:  a. 05/2017 CTA Abd/pelvis: apparent thrombus or embolus               in prox SMA (70-90%); b. 05/2017 catheter directed tPA,               mechanical thrombectomy, and stenting of the SMA. 01/19/2016: Essential hypertension with goal blood pressure less than  140/90 01/19/2016: Gastroesophageal reflux disease No date: GERD (gastroesophageal reflux disease) No date: H/O colectomy 11/14/2019: History of 2019 novel coronavirus disease (COVID-19) No date: History of kidney stones No date: Hyperlipidemia No date: Hypertension No date: Morbid obesity with BMI of 40.0-44.9, adult ([place]) 06/27/2021: NSTEMI (non-ST elevated myocardial infarction) ([place])  Comment:  a.) high sensitivity troponins trended: 893 --> 1587 -->              1748 --> 868 --> 735 ng/L 06/19/2017: Occlusive mesenteric ischemia (HCC) No date: Osteoarthritis No date: PAD (peripheral artery disease) (HCC) No date: PAF (paroxysmal atrial fibrillation) (HCC)     Comment:  a.) CHA2DS2-VASc = 6 (sex, CHF, HTN, DVT x2, aortic               plaque). b.) rate/rhythm maintained on oral metoprolol               tartrate; chronically anticoagulated with standard dose               apixaban. No date: Palpitations No date: Peripheral edema 06/01/2016:  Personal history of other malignant neoplasm of skin No date: Pneumonia No date: Pre-diabetes 06/13/2017: Primary osteoarthritis of both knees 08/06/2017: SBO (small bowel obstruction) (HCC) No date: Skin cancer of nose No date: 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HCC) No date: Vitamin D deficiency  Past Surgical History: 04/06/2022: AMPUTATION; Right     Comment:  Procedure: AMPUTATION ABOVE KNEE;  Surgeon: Algernon Huxley, MD;  Location: [facility] ORS;  Service: Vascular;                Laterality: Right; 12/09/2019: AMPUTATION TOE; Left     Comment:  Procedure: AMPUTATION TOE MPJ left;  Surgeon: Caroline More, DPM;  Location: [facility] ORS;  Service: Podiatry;                Laterality: Left; No date: APPENDECTOMY 10/28/1094: APPLICATION OF WOUND VAC     Comment:  Procedure: APPLICATION OF WOUND VAC;  Surgeon: Algernon Huxley, MD;  Location: [facility] ORS;  Service: Vascular;; No date: CHOLECYSTECTOMY No date: COLON SURGERY 03/23/2022: COLONOSCOPY WITH PROPOFOL; N/A     Comment:  Procedure: COLONOSCOPY WITH PROPOFOL;  Surgeon: Jonathon Bellows, MD;  Location: [facility] ENDOSCOPY;  Service:               Gastroenterology;  Laterality: N/A;  rectal bleed 06/21/2020: EMBOLECTOMY OR THROMBECTOMY, WITH OR WITHOUT CATHETER;  FEMOROPOPLITEAL, AORTOILIAC ARTERY, BY LEG INCISION; N/A     Comment:  Left - Decomp Fasciotomy Leg; Ant &/Or Lat Compart Only;              Midline - Angiography, Visceral, Selective Or               Supraselective (With Or Without Flush Aortogram); Left -               Revascularize, Endovasc, Open/Percut, Iliac Artery,               Unilat, Initial Vessel; W/Translum Stent, W/Angioplasty;               Right - Revascularize, Endovasc, Open/Percut, Iliac               Artery, Ea Add`L Ipsilateral; W/Translumin Stent,               W/Angioplasty; Location [facility]; 12/01/2021: ENDARTERECTOMY FEMORAL; Left     Comment:  Procedure:  ENDARTERECTOMY FEMORAL;  Surgeon: [name]  S, MD;  Location: [facility] ORS;  Service: Vascular;                Laterality: Left; 12/01/2021: ENDOVASCULAR REPAIR/STENT GRAFT; Left     Comment:  Procedure: ENDOVASCULAR REPAIR/STENT GRAFT;  Surgeon:               Algernon Huxley, MD;  Location: [place] CV LAB;                Service: Cardiovascular;  Laterality: Left; 08/08/2017: LAPAROTOMY; N/A     Comment:  Procedure: EXPLORATORY LAPAROTOMY POSSIBLE BOWEL               RESECTION;  Surgeon: Jules Husbands, MD;  Location: [facility]               ORS;  Service: General;  Laterality: N/A; 05/17/2021: LEFT HEART CATH AND CORONARY ANGIOGRAPHY; N/A     Comment:  Procedure: LEFT HEART CATH AND CORONARY ANGIOGRAPHY;                Surgeon: Yolonda Kida, MD;  Location: [place]              CV LAB;  Service: Cardiovascular;  Laterality: N/A; 11/17/2019: LOWER EXTREMITY ANGIOGRAPHY; Left     Comment:  Procedure: LOWER EXTREMITY ANGIOGRAPHY;  Surgeon: Algernon Huxley, MD;  Location: [place] CV LAB;  Service:               Cardiovascular;  Laterality: Left; 12/13/2020: LOWER EXTREMITY ANGIOGRAPHY; Right     Comment:  Procedure: LOWER EXTREMITY ANGIOGRAPHY;  Surgeon: Algernon Huxley, MD;  Location: [place] CV LAB;  Service:               Cardiovascular;  Laterality: Right; 01/10/2021: LOWER EXTREMITY ANGIOGRAPHY; Right     Comment:  Procedure: LOWER EXTREMITY ANGIOGRAPHY;  Surgeon: Algernon Huxley, MD;  Location: [place] CV LAB;  Service:               Cardiovascular;  Laterality: Right; 01/24/2021: LOWER EXTREMITY ANGIOGRAPHY; Right     Comment:  Procedure: LOWER EXTREMITY ANGIOGRAPHY;  Surgeon: Algernon Huxley, MD;  Location: [place] CV LAB;  Service:               Cardiovascular;  Laterality: Right; 06/28/2021: LOWER EXTREMITY ANGIOGRAPHY; Bilateral     Comment:  Procedure: Lower Extremity Angiography;  Surgeon:  Algernon Huxley, MD;  Location: [place] CV LAB;  Service:               Cardiovascular;  Laterality: Bilateral; 11/28/2021: LOWER EXTREMITY ANGIOGRAPHY; Right     Comment:  Procedure: LOWER EXTREMITY ANGIOGRAPHY;  Surgeon: Algernon Huxley, MD;  Location: [place] CV LAB;  Service:               Cardiovascular;  Laterality: Right; 11/30/2021: LOWER EXTREMITY ANGIOGRAPHY; Left     Comment:  Procedure: Lower Extremity Angiography;  Surgeon: Lucky Cowboy,  Erskine Squibb, MD;  Location: [place] CV LAB;  Service:               Cardiovascular;  Laterality: Left; 02/22/2022: LOWER EXTREMITY ANGIOGRAPHY; Right     Comment:  Procedure: Lower Extremity Angiography;  Surgeon: Algernon Huxley, MD;  Location: [place] [facility];  Service:               Cardiovascular;  Laterality: Right; 03/13/2022: LOWER EXTREMITY ANGIOGRAPHY; Right     Comment:  Procedure: Lower Extremity Angiography;  Surgeon: Algernon Huxley, MD;  Location: [place] CV LAB;  Service:               Cardiovascular;  Laterality: Right; 03/24/2022: LOWER EXTREMITY ANGIOGRAPHY; Right     Comment:  Procedure: Lower Extremity Angiography;  Surgeon: Algernon Huxley, MD;  Location: [place] CV [facility];  Service:               Cardiovascular;  Laterality: Right; 11/19/2019: LOWER EXTREMITY INTERVENTION; N/A     Comment:  Procedure: LOWER EXTREMITY INTERVENTION;  Surgeon: Algernon Huxley, MD;  Location: [place] CV LAB;  Service:               Cardiovascular;  Laterality: N/A; 06/29/2021: LOWER EXTREMITY INTERVENTION; Bilateral     Comment:  Procedure: LOWER EXTREMITY INTERVENTION;  Surgeon: Algernon Huxley, MD;  Location: [place] CV LAB;  Service:               Cardiovascular;  Laterality: Bilateral; 06/22/2017: TEE WITHOUT CARDIOVERSION; N/A     Comment:  Procedure: TRANSESOPHAGEAL ECHOCARDIOGRAM (TEE);                Surgeon: Wellington Hampshire, MD;  Location: [facility] ORS;                Service: Cardiovascular;  Laterality: N/A; 07/11/2018: TOTAL KNEE ARTHROPLASTY; Right     Comment:  Procedure: TOTAL KNEE ARTHROPLASTY;  Surgeon: Corky Mull, MD;  Location: [facility] ORS;  Service: Orthopedics;                Laterality: Right; No date: VAGINAL HYSTERECTOMY 06/20/2017: VISCERAL ARTERY INTERVENTION; N/A     Comment:  Procedure: Visceral Artery Intervention, possible aortic              thrombectomy;  Surgeon: Algernon Huxley, MD;  Location: [place] CV LAB;  Service: Cardiovascular;  Laterality:               N/A; 01/28/2018: VISCERAL ARTERY INTERVENTION; N/A     Comment:  Procedure: VISCERAL ARTERY INTERVENTION;  Surgeon: Algernon Huxley, MD;  Location: [place] CV LAB;  Service:               Cardiovascular;  Laterality: N/A;  BMI    Body  Mass Index: 40.24 kg/m      Reproductive/Obstetrics negative OB ROS                             Anesthesia Physical  Anesthesia Plan  ASA: 3  Anesthesia Plan: General   Post-op Pain Management:    Induction: Intravenous  PONV Risk Score and Plan: 3 and Ondansetron, Midazolam and Treatment may vary due to age or medical condition  Airway Management Planned: LMA and Oral ETT  Additional Equipment:   Intra-op Plan:   Post-operative Plan: Extubation in OR  Informed Consent: I have reviewed the patients History and Physical, chart, labs and discussed the procedure including the risks, benefits and alternatives for the proposed anesthesia with the patient or authorized representative who has indicated his/her understanding and acceptance.     Dental Advisory Given  Plan Discussed with: Anesthesiologist, CRNA and Surgeon  Anesthesia Plan Comments: (Patient consented for risks of anesthesia including but not limited to:  - adverse reactions to medications - damage to eyes, teeth, lips or other oral  mucosa - nerve damage due to positioning  - sore throat or hoarseness - Damage to heart, brain, nerves, lungs, other parts of body or loss of life  Patient voiced understanding.)        Anesthesia Quick Evaluation

## 2022-07-23 NOTE — Anesthesia Procedure Notes (Signed)
Procedure Name: Intubation Date/Time: 07/23/2022 8:09 AM  Performed by: Garner Nash, CRNAPre-anesthesia Checklist: Patient identified, Emergency Drugs available, Suction available and Patient being monitored Patient Re-evaluated:Patient Re-evaluated prior to induction Oxygen Delivery Method: Circle system utilized Preoxygenation: Pre-oxygenation with 100% oxygen Induction Type: IV induction Ventilation: Mask ventilation without difficulty and Oral airway inserted - appropriate to patient size Laryngoscope Size: Mac and 3 Grade View: Grade I Tube type: Oral Tube size: 6.5 mm Number of attempts: 1 Airway Equipment and Method: Oral airway Placement Confirmation: ETT inserted through vocal cords under direct vision, positive ETCO2 and breath sounds checked- equal and bilateral Secured at: 20 cm Tube secured with: Tape Dental Injury: Teeth and Oropharynx as per pre-operative assessment

## 2022-07-23 NOTE — Anesthesia Procedure Notes (Deleted)
Arterial Line Insertion Start/End10/10/2021 8:10 AM Performed by: anesthesiologist  Patient location: Pre-op. Preanesthetic checklist: patient identified, IV checked, site marked, risks and benefits discussed, surgical consent, monitors and equipment checked, pre-op evaluation, timeout performed and anesthesia consent Patient sedated radial was placed Catheter size: 20 G Hand hygiene performed , maximum sterile barriers used  and Seldinger technique used  Attempts: 1 Procedure performed using ultrasound guided technique. Ultrasound Notes:anatomy identified, needle tip was noted to be adjacent to the nerve/plexus identified and no ultrasound evidence of intravascular and/or intraneural injection Following insertion, dressing applied. Post procedure assessment: normal and unchanged

## 2022-07-23 NOTE — Interval H&P Note (Signed)
History and Physical Interval Note:  07/23/2022 7:51 AM  Carla [name]  has presented today for surgery, with the diagnosis of ischemia left side.  The various methods of treatment have been discussed with the patient and family. After consideration of risks, benefits and other options for treatment, the patient has consented to  Procedure(s): BYPASS GRAFT AXILLA-BIFEMORAL (Left) as a surgical intervention.  The patient's history has been reviewed, patient examined, no change in status, stable for surgery.  I have reviewed the patient's chart and labs.  Questions were answered to the patient's satisfaction.     Annamarie Major

## 2022-07-23 NOTE — Progress Notes (Signed)
PACU note:  Upon getting ready to transfer pt to ICU 3, pt suddenly becomes more awake and c/o 10/10 foot pain.  Skin noted to be mottled but warm to touch. Wound vac intact. Fentanyl and Dilaudid given per order and Dr. Trula Slade called.  Will cont to monitor in PACU and treat w/pain meds until his return to assess. Daughter updated on progress.

## 2022-07-23 NOTE — Transfer of Care (Signed)
Immediate Anesthesia Transfer of Care Note  Patient: Carla [name]  Procedure(s) Performed: BYPASS GRAFT AXILLA-BIFEMORAL (Left) APPLICATION OF WOUND VAC (Left: Groin)  Patient Location: PACU  Anesthesia Type:General  Level of Consciousness: drowsy  Airway & Oxygen Therapy: Patient Spontanous Breathing  Post-op Assessment: Report given to RN  Post vital signs: stable  Last Vitals:  Vitals Value Taken Time  BP 161/99 07/23/22 1140  Temp 96.7   Pulse 72 07/23/22 1143  Resp 16 07/23/22 1143  SpO2 94 % 07/23/22 1143  Vitals shown include unvalidated device data.  Last Pain:  Vitals:   07/22/22 2312  TempSrc:   PainSc: Asleep         Complications: No notable events documented.

## 2022-07-23 NOTE — Anesthesia Procedure Notes (Addendum)
Arterial Line Insertion Start/End10/10/2021 8:16 AM, 07/23/2022 8:23 AM Performed by: [name], MD, Garner Nash, CRNA, anesthesiologist  Patient location: Pre-op. Preanesthetic checklist: patient identified, IV checked, site marked, risks and benefits discussed, surgical consent, monitors and equipment checked, pre-op evaluation, timeout performed and anesthesia consent Lidocaine 1% used for infiltration Right, radial was placed Catheter size: 20 G Hand hygiene performed  and maximum sterile barriers used   Attempts: 1 Procedure performed using ultrasound guided technique. Ultrasound Notes:anatomy identified, needle tip was noted to be adjacent to the nerve/plexus identified and no ultrasound evidence of intravascular and/or intraneural injection Following insertion, dressing applied and Biopatch. Post procedure assessment: normal and unchanged  Patient tolerated the procedure well with no immediate complications.

## 2022-07-23 NOTE — Progress Notes (Addendum)
I was called about the patient who began complaining of worsening pain in her left foot.  Her left foot is now mottled.  No audible Doppler signals are present.  I discussed with the patient that we need to go back to the operating room for thrombectomy of her bypass graft.  Duaghter updated via phone  Annamarie Major

## 2022-07-23 NOTE — Anesthesia Preprocedure Evaluation (Signed)
Anesthesia Evaluation  Patient identified by MRN, date of birth, ID band Patient awake    Reviewed: Allergy & Precautions, NPO status , Patient's Chart, lab work & pertinent test results  History of Anesthesia Complications Negative for: history of anesthetic complications  Airway Mallampati: III  TM Distance: <3 FB Neck ROM: full    Dental  (+) Missing   Pulmonary shortness of breath, COPD, former smoker,    Pulmonary exam normal        Cardiovascular hypertension, (-) angina+ CAD, + Past MI (NSTEMI 06/2021), + Peripheral Vascular Disease and +CHF  Normal cardiovascular exam+ dysrhythmias Atrial Fibrillation   08/2012 Lexiscan MV: EF 54%, non ischemia/infarct   05/2017 CTA Abd/pelvis: apparent thrombus or embolus in prox SMA (70-90%); b. 05/2017 catheter directed tPA, mechanical thrombectomy, and stenting of the SMA.   TTE 06/20/2017: EF 60-65%, no rwma, G1DD, no source of cardiac emboli   Neuro/Psych PSYCHIATRIC DISORDERS Anxiety Depression Chronic pain syndrome  Neuromuscular disease    GI/Hepatic negative GI ROS, Neg liver ROS, GERD  Controlled,  Endo/Other  negative endocrine ROSMorbid obesity  Renal/GU Renal disease     Musculoskeletal  (+) Arthritis ,   Abdominal (+) + obese,   Peds  Hematology negative hematology ROS (+) Blood dyscrasia, anemia ,   Anesthesia Other Findings Past Medical History: No date: Abdominal aortic atherosclerosis ([place])     Comment:  a. 05/2017 CTA abd/pelvis: significant atherosclerotic               dzs of the infrarenal abd Ao w/ some mural thrombus. No               aneurysm or dissection. No date: Acute focal ischemia of small intestine ([place]) 06/13/2017: Acute right-sided low back pain with right-sided sciatica 07/29/2017: AKI (acute kidney injury) ([place]) No date: Anemia No date: Anginal pain ([place])     Comment:  a. 08/2012 Lexiscan MV: EF 54%, non ischemia/infarct. No date:  Anxiety and depression No date: Baker's cyst of knee, right     Comment:  a. 07/2016 U/S: 4.1 x 1.4 x 2.9 cystic structure in R               poplitetal fossa. No date: Bell's palsy No date: CAD (coronary artery disease) No date: CHF (congestive heart failure) ([place])     Comment:  a.) TTE 06/20/2017: EF 60-65%, no rwma, G1DD, no source               of cardiac emboli. No date: Chronic anticoagulation     Comment:  a.) apixaban No date: Chronic idiopathic constipation No date: Chronic mesenteric ischemia (HCC) No date: Chronic pain No date: COPD (chronic obstructive pulmonary disease) (HCC) No date: Dyspnea No date: 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[place])     Comment:  a. 05/2017 CTA Abd/pelvis: apparent thrombus or embolus               in prox SMA (70-90%); b. 05/2017 catheter directed tPA,               mechanical thrombectomy, and stenting of the SMA. 01/19/2016: Essential hypertension with goal blood pressure less than  140/90 01/19/2016: Gastroesophageal reflux disease No date: GERD (gastroesophageal reflux disease) No date: H/O colectomy 11/14/2019: History of 2019 novel coronavirus disease (COVID-19) No date: History of kidney stones No date: Hyperlipidemia No date: Hypertension No date: Morbid obesity with BMI of 40.0-44.9, adult ([place]) 06/27/2021: NSTEMI (non-ST elevated myocardial infarction) ([place])  Comment:  a.) high sensitivity troponins trended: 893 --> 1587 -->              1748 --> 868 --> 735 ng/L 06/19/2017: Occlusive mesenteric ischemia (HCC) No date: Osteoarthritis No date: PAD (peripheral artery disease) (HCC) No date: PAF (paroxysmal atrial fibrillation) (HCC)     Comment:  a.) CHA2DS2-VASc = 6 (sex, CHF, HTN, DVT x2, aortic               plaque). b.) rate/rhythm maintained on oral metoprolol               tartrate; chronically anticoagulated with standard dose               apixaban. No date: Palpitations No date: Peripheral edema 06/01/2016:  Personal history of other malignant neoplasm of skin No date: Pneumonia No date: Pre-diabetes 06/13/2017: Primary osteoarthritis of both knees 08/06/2017: SBO (small bowel obstruction) (HCC) No date: Skin cancer of nose No date: 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HCC) No date: Vitamin D deficiency  Past Surgical History: 04/06/2022: AMPUTATION; Right     Comment:  Procedure: AMPUTATION ABOVE KNEE;  Surgeon: Algernon Huxley, MD;  Location: [facility] ORS;  Service: Vascular;                Laterality: Right; 12/09/2019: AMPUTATION TOE; Left     Comment:  Procedure: AMPUTATION TOE MPJ left;  Surgeon: Caroline More, DPM;  Location: [facility] ORS;  Service: Podiatry;                Laterality: Left; No date: APPENDECTOMY 01/27/9628: APPLICATION OF WOUND VAC     Comment:  Procedure: APPLICATION OF WOUND VAC;  Surgeon: Algernon Huxley, MD;  Location: [facility] ORS;  Service: Vascular;; No date: CHOLECYSTECTOMY No date: COLON SURGERY 03/23/2022: COLONOSCOPY WITH PROPOFOL; N/A     Comment:  Procedure: COLONOSCOPY WITH PROPOFOL;  Surgeon: Jonathon Bellows, MD;  Location: [facility] ENDOSCOPY;  Service:               Gastroenterology;  Laterality: N/A;  rectal bleed 06/21/2020: EMBOLECTOMY OR THROMBECTOMY, WITH OR WITHOUT CATHETER;  FEMOROPOPLITEAL, AORTOILIAC ARTERY, BY LEG INCISION; N/A     Comment:  Left - Decomp Fasciotomy Leg; Ant &/Or Lat Compart Only;              Midline - Angiography, Visceral, Selective Or               Supraselective (With Or Without Flush Aortogram); Left -               Revascularize, Endovasc, Open/Percut, Iliac Artery,               Unilat, Initial Vessel; W/Translum Stent, W/Angioplasty;               Right - Revascularize, Endovasc, Open/Percut, Iliac               Artery, Ea Add`L Ipsilateral; W/Translumin Stent,               W/Angioplasty; Location [facility]; 12/01/2021: ENDARTERECTOMY FEMORAL; Left     Comment:  Procedure:  ENDARTERECTOMY FEMORAL;  Surgeon: [name]  S, MD;  Location: [facility] ORS;  Service: Vascular;                Laterality: Left; 12/01/2021: ENDOVASCULAR REPAIR/STENT GRAFT; Left     Comment:  Procedure: ENDOVASCULAR REPAIR/STENT GRAFT;  Surgeon:               Algernon Huxley, MD;  Location: [place] CV [facility];                Service: Cardiovascular;  Laterality: Left; 08/08/2017: LAPAROTOMY; N/A     Comment:  Procedure: EXPLORATORY LAPAROTOMY POSSIBLE BOWEL               RESECTION;  Surgeon: Jules Husbands, MD;  Location: [facility]               ORS;  Service: General;  Laterality: N/A; 05/17/2021: LEFT HEART CATH AND CORONARY ANGIOGRAPHY; N/A     Comment:  Procedure: LEFT HEART CATH AND CORONARY ANGIOGRAPHY;                Surgeon: Yolonda Kida, MD;  Location: [place]              CV LAB;  Service: Cardiovascular;  Laterality: N/A; 11/17/2019: LOWER EXTREMITY ANGIOGRAPHY; Left     Comment:  Procedure: LOWER EXTREMITY ANGIOGRAPHY;  Surgeon: Algernon Huxley, MD;  Location: [place] CV LAB;  Service:               Cardiovascular;  Laterality: Left; 12/13/2020: LOWER EXTREMITY ANGIOGRAPHY; Right     Comment:  Procedure: LOWER EXTREMITY ANGIOGRAPHY;  Surgeon: Algernon Huxley, MD;  Location: [place] CV LAB;  Service:               Cardiovascular;  Laterality: Right; 01/10/2021: LOWER EXTREMITY ANGIOGRAPHY; Right     Comment:  Procedure: LOWER EXTREMITY ANGIOGRAPHY;  Surgeon: Algernon Huxley, MD;  Location: [place] CV LAB;  Service:               Cardiovascular;  Laterality: Right; 01/24/2021: LOWER EXTREMITY ANGIOGRAPHY; Right     Comment:  Procedure: LOWER EXTREMITY ANGIOGRAPHY;  Surgeon: Algernon Huxley, MD;  Location: [place] CV LAB;  Service:               Cardiovascular;  Laterality: Right; 06/28/2021: LOWER EXTREMITY ANGIOGRAPHY; Bilateral     Comment:  Procedure: Lower Extremity Angiography;  Surgeon:  Algernon Huxley, MD;  Location: [place] CV [facility];  Service:               Cardiovascular;  Laterality: Bilateral; 11/28/2021: LOWER EXTREMITY ANGIOGRAPHY; Right     Comment:  Procedure: LOWER EXTREMITY ANGIOGRAPHY;  Surgeon: Algernon Huxley, MD;  Location: [place] CV LAB;  Service:               Cardiovascular;  Laterality: Right; 11/30/2021: LOWER EXTREMITY ANGIOGRAPHY; Left     Comment:  Procedure: Lower Extremity Angiography;  Surgeon: Lucky Cowboy,  Erskine Squibb, MD;  Location: [place] CV LAB;  Service:               Cardiovascular;  Laterality: Left; 02/22/2022: LOWER EXTREMITY ANGIOGRAPHY; Right     Comment:  Procedure: Lower Extremity Angiography;  Surgeon: Algernon Huxley, MD;  Location: [place] CV LAB;  Service:               Cardiovascular;  Laterality: Right; 03/13/2022: LOWER EXTREMITY ANGIOGRAPHY; Right     Comment:  Procedure: Lower Extremity Angiography;  Surgeon: Algernon Huxley, MD;  Location: [place] CV LAB;  Service:               Cardiovascular;  Laterality: Right; 03/24/2022: LOWER EXTREMITY ANGIOGRAPHY; Right     Comment:  Procedure: Lower Extremity Angiography;  Surgeon: Algernon Huxley, MD;  Location: [place] CV [facility];  Service:               Cardiovascular;  Laterality: Right; 11/19/2019: LOWER EXTREMITY INTERVENTION; N/A     Comment:  Procedure: LOWER EXTREMITY INTERVENTION;  Surgeon: Algernon Huxley, MD;  Location: [place] CV LAB;  Service:               Cardiovascular;  Laterality: N/A; 06/29/2021: LOWER EXTREMITY INTERVENTION; Bilateral     Comment:  Procedure: LOWER EXTREMITY INTERVENTION;  Surgeon: Algernon Huxley, MD;  Location: [place] [facility];  Service:               Cardiovascular;  Laterality: Bilateral; 06/22/2017: TEE WITHOUT CARDIOVERSION; N/A     Comment:  Procedure: TRANSESOPHAGEAL ECHOCARDIOGRAM (TEE);                Surgeon: Wellington Hampshire, MD;  Location: [facility] ORS;                Service: Cardiovascular;  Laterality: N/A; 07/11/2018: TOTAL KNEE ARTHROPLASTY; Right     Comment:  Procedure: TOTAL KNEE ARTHROPLASTY;  Surgeon: Corky Mull, MD;  Location: [facility] ORS;  Service: Orthopedics;                Laterality: Right; No date: VAGINAL HYSTERECTOMY 06/20/2017: VISCERAL ARTERY INTERVENTION; N/A     Comment:  Procedure: Visceral Artery Intervention, possible aortic              thrombectomy;  Surgeon: Algernon Huxley, MD;  Location: [place] CV LAB;  Service: Cardiovascular;  Laterality:               N/A; 01/28/2018: VISCERAL ARTERY INTERVENTION; N/A     Comment:  Procedure: VISCERAL ARTERY INTERVENTION;  Surgeon: Algernon Huxley, MD;  Location: [place] CV LAB;  Service:               Cardiovascular;  Laterality: N/A;  BMI    Body  Mass Index: 40.24 kg/m    Patient with axillary-femoral bypass earlier today that now appears to have failed.  Patient's foot is mottled and she is in extreme pain, so we will proceed emergently to the OR for thrombectomy/revasularization.  Reproductive/Obstetrics negative OB ROS                             Anesthesia Physical  Anesthesia Plan  ASA: 3 and emergent  Anesthesia Plan: General   Post-op Pain Management:    Induction: Intravenous  PONV Risk Score and Plan: 3 and Ondansetron, Midazolam and Treatment may vary due to age or medical condition  Airway Management Planned: Oral ETT  Additional Equipment: Arterial line  Intra-op Plan:   Post-operative Plan: Extubation in OR  Informed Consent: I have reviewed the patients History and Physical, chart, labs and discussed the procedure including the risks, benefits and alternatives for the proposed anesthesia with the patient or authorized representative who has indicated his/her understanding and acceptance.     Dental Advisory Given  Plan  Discussed with: Anesthesiologist, CRNA and Surgeon  Anesthesia Plan Comments: (Patient consented for risks of anesthesia including but not limited to:  - adverse reactions to medications - damage to eyes, teeth, lips or other oral mucosa - nerve damage due to positioning  - sore throat or hoarseness - Damage to heart, brain, nerves, lungs, other parts of body or loss of life  Patient voiced understanding.)        Anesthesia Quick Evaluation

## 2022-07-23 NOTE — Transfer of Care (Signed)
Immediate Anesthesia Transfer of Care Note  Patient: Carla [name]  Procedure(s) Performed: THROMBECTOMY OF GREAT VESSEL WITH WOUND VAC DRESSING CHANGE (Left: Groin)  Patient Location: PACU and ICU  Anesthesia Type:General  Level of Consciousness: awake  Airway & Oxygen Therapy: Patient connected to nasal cannula oxygen  Post-op Assessment: Report given to RN  Post vital signs: stable  Last Vitals:  Vitals Value Taken Time  BP 115/101 07/23/22 1700  Temp 36.4 C 07/23/22 1658  Pulse 102 07/23/22 1711  Resp 7 07/23/22 1711  SpO2 95 % 07/23/22 1711  Vitals shown include unvalidated device data.  Last Pain:  Vitals:   07/23/22 1658  TempSrc: Axillary  PainSc:          Complications: No notable events documented.

## 2022-07-24 ENCOUNTER — Encounter (INDEPENDENT_AMBULATORY_CARE_PROVIDER_SITE_OTHER): Payer: Medicare Other

## 2022-07-24 ENCOUNTER — Ambulatory Visit (INDEPENDENT_AMBULATORY_CARE_PROVIDER_SITE_OTHER): Payer: Medicare Other | Admitting: Nurse Practitioner

## 2022-07-24 ENCOUNTER — Encounter: Payer: Self-pay | Admitting: Surgery

## 2022-07-24 DIAGNOSIS — S7011XA Contusion of right thigh, initial encounter: Secondary | ICD-10-CM

## 2022-07-24 DIAGNOSIS — L02415 Cutaneous abscess of right lower limb: Secondary | ICD-10-CM

## 2022-07-24 DIAGNOSIS — I70222 Atherosclerosis of native arteries of extremities with rest pain, left leg: Secondary | ICD-10-CM | POA: Diagnosis not present

## 2022-07-24 LAB — GLUCOSE, CAPILLARY
Glucose-Capillary: 136 mg/dL — ABNORMAL HIGH (ref 70–99)
Glucose-Capillary: 152 mg/dL — ABNORMAL HIGH (ref 70–99)
Glucose-Capillary: 155 mg/dL — ABNORMAL HIGH (ref 70–99)
Glucose-Capillary: 172 mg/dL — ABNORMAL HIGH (ref 70–99)
Glucose-Capillary: 178 mg/dL — ABNORMAL HIGH (ref 70–99)
Glucose-Capillary: 180 mg/dL — ABNORMAL HIGH (ref 70–99)
Glucose-Capillary: 187 mg/dL — ABNORMAL HIGH (ref 70–99)

## 2022-07-24 LAB — BASIC METABOLIC PANEL
Anion gap: 8 (ref 5–15)
BUN: 13 mg/dL (ref 8–23)
CO2: 20 mmol/L — ABNORMAL LOW (ref 22–32)
Calcium: 8.3 mg/dL — ABNORMAL LOW (ref 8.9–10.3)
Chloride: 110 mmol/L (ref 98–111)
Creatinine, Ser: 0.8 mg/dL (ref 0.44–1.00)
GFR, Estimated: >60 mL/min (ref >60)
Glucose, Bld: 158 mg/dL — ABNORMAL HIGH (ref 70–99)
Potassium: 4.3 mmol/L (ref 3.5–5.1)
Sodium: 138 mmol/L (ref 135–145)

## 2022-07-24 LAB — CBC
HCT: 37.8 % (ref 36.0–46.0)
Hemoglobin: 11.8 g/dL — ABNORMAL LOW (ref 12.0–15.0)
MCH: 27.1 pg (ref 26.0–34.0)
MCHC: 31.2 g/dL (ref 30.0–36.0)
MCV: 86.7 fL (ref 80.0–100.0)
Platelets: 203 10*3/uL (ref 150–400)
RBC: 4.36 MIL/uL (ref 3.87–5.11)
RDW: 16.8 % — ABNORMAL HIGH (ref 11.5–15.5)
WBC: 19 10*3/uL — ABNORMAL HIGH (ref 4.0–10.5)
nRBC: 0.1 % (ref 0.0–0.2)

## 2022-07-24 LAB — HEMOGLOBIN A1C
Hgb A1c MFr Bld: 6.2 % — ABNORMAL HIGH (ref 4.8–5.6)
Mean Plasma Glucose: 131.24 mg/dL

## 2022-07-24 LAB — HEPARIN LEVEL (UNFRACTIONATED)
Heparin Unfractionated: 0.9 IU/mL — ABNORMAL HIGH (ref 0.30–0.70)
Heparin Unfractionated: 0.6 IU/mL (ref 0.30–0.70)

## 2022-07-24 LAB — APTT: aPTT: 93 seconds — ABNORMAL HIGH (ref 24–36)

## 2022-07-24 MED ORDER — CHLORHEXIDINE GLUCONATE CLOTH 2 % EX PADS
6.0000 | MEDICATED_PAD | Freq: Every day | CUTANEOUS | Status: DC
  Administered 2022-07-24 – 2022-08-11 (×15): 6 via TOPICAL

## 2022-07-24 MED ORDER — SODIUM CHLORIDE 0.9 % IV SOLN
2.0000 g | Freq: Three times a day (TID) | INTRAVENOUS | Status: DC
  Administered 2022-07-24 – 2022-07-25 (×3): 2 g via INTRAVENOUS
  Filled 2022-07-24: qty 12.5
  Filled 2022-07-24 (×2): qty 2
  Filled 2022-07-24: qty 12.5

## 2022-07-24 NOTE — Progress Notes (Signed)
ANTICOAGULATION CONSULT NOTE - Initial Consult  Pharmacy Consult for Heparin infusion Indication: DVT/Ischemic leg   Allergies  Allergen Reactions   Gabapentin     Tremors    Bactrim [Sulfamethoxazole-Trimethoprim] Itching    Patient Measurements: Height: '5\' 2"'$  (157.5 cm) Weight: 80.7 kg (178 lb) IBW/kg (Calculated) : 50.1 Heparin Dosing Weight: 68.1 kg   Vital Signs: Temp: 98.4 F (36.9 C) (10/02 0400) Temp Source: Oral (10/02 0400) BP: 126/87 (10/02 0400) Pulse Rate: 122 (10/02 0400)  Labs: Recent Labs    07/22/22 0030 07/22/22 0630 07/22/22 0730 07/22/22 1751 07/23/22 0030 07/23/22 1450 07/23/22 1719 07/23/22 2210 07/24/22 0445  HGB 11.9*  --   --   --  10.3*  --  10.6*  --  11.8*  HCT 38.1  --   --   --  33.7*  --  34.8*  --  37.8  PLT 237  --   --   --  197  --  167  --  203  APTT 26  --  49*  --   --  162*  --   --  93*  LABPROT 12.5  --   --   --   --   --   --   --   --   INR 0.9  --   --   --   --   --   --   --   --   HEPARINUNFRC 0.12*  --  0.29*   < > 0.72* >1.10*  --  >1.10* 0.90*  CREATININE 0.81  --   --   --   --   --  0.86  --  0.80  TROPONINIHS 4 4  --   --   --   --   --   --   --    < > = values in this interval not displayed.     Estimated Creatinine Clearance: 71.7 mL/min (by C-G formula based on SCr of 0.8 mg/dL).   Medical History: Past Medical History:  Diagnosis Date   Abdominal aortic atherosclerosis ([place])    a. 05/2017 CTA abd/pelvis: significant atherosclerotic dzs of the infrarenal abd Ao w/ some mural thrombus. No aneurysm or dissection.   Acute focal ischemia of small intestine (HCC)    Acute right-sided low back pain with right-sided sciatica 06/13/2017   AKI (acute kidney injury) ([place]) 07/29/2017   Anemia    Anginal pain ([place])    a. 08/2012 Lexiscan MV: EF 54%, non ischemia/infarct.   Anxiety and depression    Baker's cyst of knee, right    a. 07/2016 U/S: 4.1 x 1.4 x 2.9 cystic structure in R poplitetal fossa.    Bell's palsy    CAD (coronary artery disease)    CHF (congestive heart failure) ([place])    a.) TTE 06/20/2017: EF 60-65%, no rwma, G1DD, no source of cardiac emboli.   Chronic anticoagulation    a.) apixaban   Chronic idiopathic constipation    Chronic mesenteric ischemia (HCC)    Chronic pain    COPD (chronic obstructive pulmonary disease) (HCC)    Dyspnea    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[place])    a. 05/2017 CTA Abd/pelvis: apparent thrombus or embolus in prox SMA (70-90%); b. 05/2017 catheter directed tPA, mechanical thrombectomy, and stenting of the SMA.   Essential hypertension with goal blood pressure less than 140/90 01/19/2016   Gastroesophageal reflux disease 01/19/2016   GERD (gastroesophageal reflux disease)    H/O colectomy  History of 2019 novel coronavirus disease (COVID-19) 11/14/2019   History of kidney stones    Hyperlipidemia    Hypertension    Morbid obesity with BMI of 40.0-44.9, adult (HCC)    NSTEMI (non-ST elevated myocardial infarction) ([place]) 06/27/2021   a.) high sensitivity troponins trended: 893 --> 1587 --> 1748 --> 868 --> 735 ng/L   Occlusive mesenteric ischemia (HCC) 06/19/2017   Osteoarthritis    PAD (peripheral artery disease) (HCC)    PAF (paroxysmal atrial fibrillation) (HCC)    a.) CHA2DS2-VASc = 6 (sex, CHF, HTN, DVT x2, aortic plaque). b.) rate/rhythm maintained on oral metoprolol tartrate; chronically anticoagulated with standard dose apixaban.   Palpitations    Peripheral edema    Personal history of other malignant neoplasm of skin 06/01/2016   Pneumonia    Pre-diabetes    Primary osteoarthritis of both knees 06/13/2017   SBO (small bowel obstruction) ([place]) 08/06/2017   Skin cancer of nose    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HCC)    Vitamin D deficiency     Medications:  Medications Prior to Admission  Medication Sig Dispense Refill Last Dose   amLODipine (NORVASC) 5 MG tablet Take 5 mg by mouth daily.   07/21/2022 at 0800    apixaban (ELIQUIS) 5 MG TABS tablet Take 1 tablet (5 mg total) by mouth 2 (two) times daily. 60 tablet 11 07/21/2022 at 0800   aspirin EC 81 MG tablet Take 81 mg by mouth daily. Swallow whole.   07/21/2022 at 0800   atorvastatin (LIPITOR) 80 MG tablet Take 80 mg by mouth at bedtime.   07/21/2022 at 2000   citalopram (CELEXA) 40 MG tablet Take 1 tablet (40 mg total) by mouth daily. 30 tablet 2 07/21/2022 at 0800   COMBIVENT RESPIMAT 20-100 MCG/ACT AERS respimat    prn at prn   esomeprazole (NEXIUM) 20 MG capsule Take 20 mg by mouth daily.   07/21/2022 at 0800   ferrous sulfate 325 (65 FE) MG tablet Take 325 mg by mouth daily.   07/21/2022 at 0800   furosemide (LASIX) 40 MG tablet Take 60 mg by mouth daily.   07/21/2022 at 0800   isosorbide mononitrate (IMDUR) 60 MG 24 hr tablet Take 60 mg by mouth daily.   07/21/2022 at 0800   metoprolol tartrate (LOPRESSOR) 50 MG tablet Take 50 mg by mouth 2 (two) times daily.   07/21/2022 at 2000   Multiple Vitamin (MULTIVITAMIN WITH MINERALS) TABS tablet Take 1 tablet by mouth daily. 30 tablet 0 07/21/2022 at 0800   omeprazole (PRILOSEC) 20 MG capsule Take 20 mg by mouth 2 (two) times daily before a meal.   07/21/2022 at 2000   ondansetron (ZOFRAN) 4 MG tablet Take 4 mg by mouth every 8 (eight) hours as needed for nausea or vomiting.   prn at prn   oxyCODONE-acetaminophen (PERCOCET/ROXICET) 5-325 MG tablet Take 1 tablet by mouth every 6 (six) hours as needed for moderate pain. 40 tablet 0 prn at prn   pregabalin (LYRICA) 100 MG capsule Take 1 capsule (100 mg total) by mouth 3 (three) times daily. 90 capsule 3 07/21/2022 at 2000   Amino Acids-Protein Hydrolys (PRO-STAT) LIQD Take 30 mLs by mouth in the morning and at bedtime. (Patient not taking: Reported on 07/22/2022)   Not Taking   feeding supplement (ENSURE ENLIVE / ENSURE PLUS) LIQD Take 237 mLs by mouth 2 (two) times daily between meals. 237 mL 12    methocarbamol (ROBAXIN) 500 MG tablet Take 1 tablet (  500 mg total) by  mouth every 8 (eight) hours as needed for muscle spasms. (Patient not taking: Reported on 07/22/2022) 30 tablet 0 Not Taking    Assessment: Pharmacy consulted to dose heparin in this 61 year old female admitted with ischemic leg.   Pt reports taking Eliquis 5 mg PO BID PTA with last dose on 9/29 @ 2000.   However, baseline HL not significantly elevated (0.12).   Assume pt is confused or noncompliant.   Goal of Therapy:  Heparin level 0.5 - 1.0 units/mL per vascular   Monitor platelets by anticoagulation protocol: Yes  Date Time HL Rate/Comment 9/30 0730 0.29 Subtherapeutic/ 1000u + incr from 1100 > 1250 u/hr 9/30 1751 0.20 Subtherapeutic/ 2000u + incr from 1250 > 1550 u/hr 10/01   0030    0.72    Elevated 10/01  1600 >1.10 Supratherapeutic (2/2 8000u bolus) / rate increased from 1400> 1800 10/01   2210    >1.10  Supratherapeutic X 2 @ 1800 units/hr  10/02   0445     0.9     Therapeutic X 1 @ 1600 units/hr  Documentation:  Critically elevated aPTT at 162 with correlating anti-xa level of >1.10 s/p thrombectomy on 10/1 at 1600. Vascular believes this may be secondary to heparin 8,000 unit bolus given at 1400 during procedure. Heparin infusion rate was increased from 1400 to 1800 units/hr. Instructed by vascular surgeon to continue heparin infusion rate as is. Vascular also requests that we refrain from holding infusion. Goal heparin level increased to 0.5 - 1.0 units/mL. If heparin level supratherapeutic, only decrease rate.  Plan:  10/2 @ 0445:  HL = 0.9, therapeutic X 1 Will continue pt on current rate and recheck HL on 10/2 @ 1100.   Jayvin Hurrell D 07/24/2022,6:51 AM

## 2022-07-24 NOTE — Evaluation (Signed)
Physical Therapy Evaluation Patient Details Name: Carla [name] MRN: [id number] DOB: 1961-08-06 Today's Date: 07/24/2022  History of Present Illness  Pt is a 61 y.o. female presenting to hospital 9/30 with c/o L leg pain x1 week; h/o revascularization; recent R AKA.  Pt admitted with critical limb ischemia of L LE, peripheral artery disease s/p R AKA, PAF, and chronic diastolic CHF.  Pt s/p L axillary to common femoral artery bypass graft 07/23/22.  Pt c/o worsening pain of L foot post op and noted to be mottled and pt returned to OR same day and s/p thrombectomy of L axillary femoral bypast graft with revision.  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  Clinical Impression  PT/OT co-evaluation performed; pt cleared for participation by MD and RN.  Prior to hospital admission, pt reports requiring 2 assist for w/c level transfers daily; uses manual w/c.  During session pt requiring increased time for processing 1 step cues.  Currently pt is max to total assist x2 semi-supine to/from sitting edge of bed and SBA with sitting balance; only able to sit a few minutes (and further mobility deferred) d/t significant L groin/L LE pain with any mobility/movement.  Pt's HR 124 bpm at rest beginning of session; HR increased to 146 bpm sitting edge of bed after a few minutes (so therapy assisted pt with laying back down in bed); pt's HR improved to 120's bpm but pt with bowel movements requiring assist for clean-up (pt's HR increased up to 151 bpm with logrolling in bed so pt repositioned for comfort to allow pt to rest--pt's HR improved to 122 bpm end of session at rest).  Pt would benefit from skilled PT to address noted impairments and functional limitations (see below for any  additional details).  Upon hospital discharge, pt would benefit from SNF.    Recommendations for follow up therapy are one component of a multi-disciplinary discharge planning process, led by the attending physician.  Recommendations may be updated based on patient status, additional functional criteria and insurance authorization.  Follow Up Recommendations Skilled nursing-short term rehab (<3 hours/day) Can patient physically be transported by private vehicle: No    Assistance Recommended at Discharge Frequent or constant Supervision/Assistance  Patient can return home with the following  Two people to help with walking and/or transfers;Two people to help with bathing/dressing/bathroom;Assistance with cooking/housework;Assist for transportation;Help with stairs or ramp for entrance    Equipment Recommendations Other (comment) (TBD at next facility)  Recommendations for Other Services       Functional Status Assessment Patient has had a recent decline in their functional status and demonstrates the ability to make significant improvements in function in a reasonable and predictable amount of time.     Precautions / Restrictions Precautions Precautions: Fall Restrictions Weight Bearing Restrictions: No      Mobility  Bed Mobility Overal bed mobility: Needs Assistance Bed Mobility: Supine to Sit, Sit to Supine     Supine to sit: Max assist, Total assist, +2 for physical assistance, HOB elevated Sit to supine: Max assist, Total assist, +2 for physical assistance, HOB elevated   General bed mobility comments: assist for trunk and LE's; use of bed pad    Transfers  General transfer comment: not appropriate at this time    Ambulation/Gait                  Stairs            Wheelchair Mobility    Modified Rankin (Stroke Patients Only)       Balance Overall balance assessment: Needs assistance Sitting-balance support: Bilateral  upper extremity supported (L LE supported) Sitting balance-[name]: Fair Sitting balance - Comments: steady static sitting edge of bed                                     Pertinent Vitals/Pain Pain Assessment Pain Assessment: Faces Faces Pain Scale: Hurts little more (L groin/LE 4/10 at rest; 8-10/10 with activity) Pain Location: L groin/LE Pain Descriptors / Indicators: Tender, Discomfort, Grimacing, Guarding, Operative site guarding, Aching Pain Intervention(s): Limited activity within patient's tolerance, Monitored during session, Premedicated before session, Repositioned O2 sats 96% on room air end of session at rest.    Home Living Family/patient expects to be discharged to:: Private residence Living Arrangements: Children;Other relatives (Daughter and son in [employer]) Available Help at Discharge: Family Type of Home: Mobile home Home Access: Level entry       Home Layout: One level Home Equipment: Wheelchair - manual;Shower seat Additional Comments: Pt reports not being able to think clearly regarding PLOF so unsure if above information accurate.    Prior Function Prior Level of Function : Needs assist       Physical Assist : Mobility (physical);ADLs (physical)     Mobility Comments: Pt reports requiring 2 assist with stand pivot transfers (from daughter and son in law) to manual w/c.  Pt reports sometimes she propels w/c herself and sometimes she is pushed in manual w/c. ADLs Comments: Pt reports using briefs for toileting and her daughter assists with getting pt cleaned up.     Hand Dominance        Extremity/Trunk Assessment   Upper Extremity Assessment Upper Extremity Assessment: Defer to OT evaluation    Lower Extremity Assessment Lower Extremity Assessment: Generalized weakness (pt did not attempt to move L LE during session d/t L LE pain)    Cervical / Trunk Assessment Cervical / Trunk Assessment: Other exceptions Cervical / Trunk  Exceptions: forward head/shoulders  Communication   Communication: No difficulties  Cognition Arousal/Alertness:  (Pt appeared tired at beginning of session but became more alert with activities.) Behavior During Therapy: Anxious Overall Cognitive Status: No family/caregiver present to determine baseline cognitive functioning                                 General Comments: Oriented to at least person and year.  Increased time to follow 1 step cues.        General Comments General comments (skin integrity, edema, etc.): L groin wound vac in place (wound vac making noise upon therapy entering room--nurse checked wound vac and reported she would have someone come check on it).  Nursing cleared pt for participation in physical therapy.    Exercises     Assessment/Plan    PT Assessment Patient needs continued PT services  PT Problem List Decreased strength;Decreased activity tolerance;Decreased balance;Decreased mobility;Decreased knowledge of use of DME;Decreased knowledge of precautions;Cardiopulmonary status limiting activity;Pain;Decreased skin integrity       PT Treatment Interventions DME instruction;Functional  mobility training;Therapeutic activities;Therapeutic exercise;Balance training;Patient/family education    PT Goals (Current goals can be found in the Care Plan section)  Acute Rehab PT Goals Patient Stated Goal: to improve pain and mobility PT Goal Formulation: With patient Time For Goal Achievement: 08/07/22 Potential to Achieve Goals: Fair    Frequency Min 2X/week     Co-evaluation PT/OT/SLP Co-Evaluation/Treatment: Yes Reason for Co-Treatment: For patient/therapist safety;To address functional/ADL transfers PT goals addressed during session: Mobility/safety with mobility;Balance OT goals addressed during session: ADL's and self-care       AM-PAC PT "6 Clicks" Mobility  Outcome Measure Help needed turning from your back to your side while in  a flat bed without using bedrails?: A Lot Help needed moving from lying on your back to sitting on the side of a flat bed without using bedrails?: Total Help needed moving to and from a bed to a chair (including a wheelchair)?: Total Help needed standing up from a chair using your arms (e.g., wheelchair or bedside chair)?: Total Help needed to walk in hospital room?: Total Help needed climbing 3-5 steps with a railing? : Total 6 Click Score: 7    End of Session   Activity Tolerance: Patient limited by pain Patient left: in bed;with call bell/phone within reach;with bed alarm set;Other (comment) (L LE on pillow with heel floating) Nurse Communication: Mobility status;Precautions PT Visit Diagnosis: Other abnormalities of gait and mobility (R26.89);Muscle weakness (generalized) (M62.81);Pain Pain - Right/Left: Left Pain - part of body: Leg    Time: 3817-7116 PT Time Calculation (min) (ACUTE ONLY): 30 min   Charges:   PT Evaluation $PT Eval Low Complexity: 1 Low         Carmilla Granville, PT 07/24/22, 1:00 PM

## 2022-07-24 NOTE — Anesthesia Postprocedure Evaluation (Signed)
Anesthesia Post Note  Patient: Carla [name]  Procedure(s) Performed: THROMBECTOMY OF GREAT VESSEL WITH WOUND VAC DRESSING CHANGE (Left: Groin)  Patient location during evaluation: SICU Anesthesia Type: General Level of consciousness: lethargic and patient cooperative Pain management: pain level controlled Vital Signs Assessment: post-procedure vital signs reviewed and stable Respiratory status: spontaneous breathing and nonlabored ventilation Cardiovascular status: stable Postop Assessment: no apparent nausea or vomiting Anesthetic complications: no   No notable events documented.   Last Vitals:  Vitals:   07/24/22 0800 07/24/22 0900  BP: (!) 126/90 111/86  Pulse:  (!) 118  Resp: (!) 21 17  Temp: 37.1 C   SpO2:  92%    Last Pain:  Vitals:   07/24/22 0800  TempSrc: Oral  PainSc: [place]

## 2022-07-24 NOTE — Progress Notes (Signed)
PROGRESS NOTE    Carla [name]  ZOX:[id number] DOB: [birth year] DOA: 07/22/2022 PCP: [facility] [place]    Brief Narrative:  Carla [name] is a 61 y.o. female with medical history significant for peripheral arterial disease s/p right AKA, chronic diastolic dysfunction CHF, anxiety and depression, COPD, paroxysmal A-fib on chronic anticoagulation therapy, who presents to the ED with left leg pain for the past 1 week but has been progressively worsening.   CTA found with ischemic LLE. See cta results below  10/1 pt went to OR, s/p vascular procedure, post OR woke up c/o leg pain, mottled leg, went back to OR s/p thrombectomy of Left axillary femoral bypass graft with revision. Transferred to ICU.  10/2 pt opens eyes, denies foot /leg pain. Denies sob or any complaints.   Consultants:  Vascular surgery  Procedures:  CTA VASCULAR   Occlusion of the aorto bi-iliac stents with distal reconstitution of the external iliac/common femoral arteries.   Patent three-vessel runoff of the left lower extremity to the level of the calf where there is non opacification of the distal lower extremity arteries. This may be due to slow flow however occlusion is not excluded.   Occluded SMA stent at the origin with distal reconstitution.   NON-VASCULAR   Right above-the-knee amputation. At the site of amputation there is a fluid collection concerning for abscess.   Large ventral abdominal wall hernia containing nonobstructive loops of bowel.       Antimicrobials:  Cefazolin x3 doses    Subjective: As above  Objective: Vitals:   07/24/22 1318 07/24/22 1400 07/24/22 1500 07/24/22 1600  BP:  94/65 104/62 114/70  Pulse: (!) 122 (!) 116 (!) 118 (!) 124  Resp: '12 18 18 '$ (!) 21  Temp:      TempSrc:      SpO2: 99% 95% 94% 96%  Weight:      Height:        Intake/Output Summary (Last 24 hours) at 07/24/2022 1650 Last data filed at 07/24/2022 1344 Gross per 24 hour  Intake  1230.23 ml  Output 925 ml  Net 305.23 ml   Filed Weights   07/22/22 0029  Weight: 80.7 kg    Examination: Calm, NAD, under warm huggers/warmers Cta no w/r Reg s1/s2 no gallop Soft +bs.  +edema. Mild mottling of toes Awake, alert  Mood and affect appropriate in current setting      Data Reviewed: I have personally reviewed following labs and imaging studies  CBC: Recent Labs  Lab 07/22/22 0030 07/23/22 0030 07/23/22 1719 07/24/22 0445  WBC 8.3 6.3 12.2* 19.0*  NEUTROABS 4.8  --   --   --   HGB 11.9* 10.3* 10.6* 11.8*  HCT 38.1 33.7* 34.8* 37.8  MCV 88.4 90.6 89.7 86.7  PLT 237 197 167 811   Basic Metabolic Panel: Recent Labs  Lab 07/22/22 0030 07/23/22 1719 07/24/22 0445  NA 143 137 138  K 3.9 4.5 4.3  CL 108 109 110  CO2 25 20* 20*  GLUCOSE 102* 230* 158*  BUN '12 11 13  '$ CREATININE 0.81 0.86 0.80  CALCIUM 8.9 8.4* 8.3*   GFR: Estimated Creatinine Clearance: 71.7 mL/min (by C-G formula based on SCr of 0.8 mg/dL). Liver Function Tests: Recent Labs  Lab 07/22/22 0030  AST 13*  ALT 10  ALKPHOS 90  BILITOT 0.5  PROT 6.7  ALBUMIN 3.1*   No results for input(s): "LIPASE", "AMYLASE" in the last 168 hours. No results for input(s): "AMMONIA"  in the last 168 hours. Coagulation Profile: Recent Labs  Lab 07/22/22 0030  INR 0.9   Cardiac Enzymes: No results for input(s): "CKTOTAL", "CKMB", "CKMBINDEX", "TROPONINI" in the last 168 hours. BNP (last 3 results) No results for input(s): "PROBNP" in the last 8760 hours. HbA1C: Recent Labs    07/23/22 1719  HGBA1C 6.2*   CBG: Recent Labs  Lab 07/24/22 0004 07/24/22 0444 07/24/22 0823 07/24/22 1103 07/24/22 1505  GLUCAP 136* 152* 172* 180* 187*   Lipid Profile: No results for input(s): "CHOL", "HDL", "LDLCALC", "TRIG", "CHOLHDL", "LDLDIRECT" in the last 72 hours. Thyroid Function Tests: No results for input(s): "TSH", "T4TOTAL", "FREET4", "T3FREE", "THYROIDAB" in the last 72 hours. Anemia  Panel: No results for input(s): "VITAMINB12", "FOLATE", "FERRITIN", "TIBC", "IRON", "RETICCTPCT" in the last 72 hours. Sepsis Labs: No results for input(s): "PROCALCITON", "LATICACIDVEN" in the last 168 hours.  Recent Results (from the past 240 hour(s))  MRSA Next Gen by PCR, Nasal     Status: Abnormal   Collection Time: 07/23/22 12:17 AM   Specimen: Nasal Mucosa; Nasal Swab  Result Value Ref Range Status   MRSA by PCR Next Gen DETECTED (A) NOT DETECTED Final    Comment: RESULT CALLED TO, READ BACK BY AND VERIFIED WITH: Clide Cliff @ [phone] on 07/23/2022 by CAF (NOTE) The GeneXpert MRSA Assay (FDA approved for NASAL specimens only), is one component of a comprehensive MRSA colonization surveillance program. It is not intended to diagnose MRSA infection nor to guide or monitor treatment for MRSA infections. Test performance is not FDA approved in patients less than 53 years old. Performed at [facility], [address] [place], [place] [address]          Radiology Studies: No results found.      Scheduled Meds:  aspirin EC  81 mg Oral Q0600   atorvastatin  80 mg Oral QHS   Chlorhexidine Gluconate Cloth  6 each Topical Daily   citalopram  40 mg Oral Daily   docusate sodium  100 mg Oral Daily   insulin aspart  0-15 Units Subcutaneous Q4H   isosorbide mononitrate  60 mg Oral Daily   mupirocin ointment  1 Application Nasal BID   pantoprazole  40 mg Oral Daily   pregabalin  100 mg Oral TID   Continuous Infusions:  sodium chloride     ceFEPime (MAXIPIME) IV Stopped (07/24/22 1200)   dextrose 5 % and 0.9% NaCl 50 mL/hr at 07/24/22 1344   heparin 1,600 Units/hr (07/24/22 0816)   magnesium sulfate bolus IVPB      Assessment & Plan:   Principal Problem:   Critical limb ischemia of left lower extremity (HCC) Active Problems:   PAF (paroxysmal atrial fibrillation) (HCC)   Chronic diastolic CHF (congestive heart failure) (HCC)   Abscess of knee,  right   * Critical limb ischemia of left lower extremity (HCC) Peripheral artery disease s/p right BKA  on heparin gtt, eliquis held S/p OR initially for Left axillary to common femoral artery bypass graft, Prevena wound VAC placed .  Post surgery patient had left foot pain and mottled leg was taken to the OR again , now status post thrombectomy of left axillofemoral bypass graft 10/2 primary mx per vascular      PAF (paroxysmal atrial fibrillation) (HCC) Continue heparin gtt until able to take eliquis Eliquis on hold prior to surgery Continue beta blk       Chronic diastolic CHF (congestive heart failure) ([place]) Compensated Continue imdur and beta  blk  Rt knee fluid collection At site of amputation, found on CT concern for abscess Will start cefepime May need ortho consult.   DVT prophylaxis: Heparin infusion Code Status: Full Family Communication: None at bedside Disposition Plan: TBD Status is: Inpatient Remains inpatient appropriate because: IV treatment.  Needs clearance by vascular     LOS: 2 days   Time spent: 35 min    Nolberto Hanlon, MD [facility] Pager [phone]  If 7PM-7AM, please contact night-coverage 07/24/2022, 4:50 PM

## 2022-07-24 NOTE — Evaluation (Signed)
Occupational Therapy Evaluation Patient Details Name: Carla [name] MRN: [id number] DOB: September 13, 1961 Today's Date: 07/24/2022   History of Present Illness Pt is a 61 year old female s/p OR initially for Left axillary to common femoral artery bypass graft, Prevena wound VAC placed .  Post surgery patient had left foot pain and mottled leg was taken to the OR again , now status post thrombectomy of left axillofemoral bypass graft after presentingto the ED with progressively worsening left leg pain; PMH significant for arterial disease s/p right AKA, chronic diastolic dysfunction CHF, anxiety and depression, COPD, paroxysmal A-fib on chronic anticoagulation therapy   Clinical Impression   Chart reviewed, RN and MD cleared pt for participation in OT evaluation. Co tx completed with PT on this date. Pt is oriented to self, place, situation, oriented to year. Pt requires increased time for processing with poor one step direction following. Pt is limited by pain on this date. Pt endorses she requires assist for ADL/IADL PTA, lives with daughter/ son in law. Pt reports she SPT to [facility]wc daily with help of family. Pt requires MOD-TOTAL A +2 for bed nobility, TOTAL A for LB dressing, MAX A for UB dressing on this date. Pt HR up to 151 with activity, resting at 122 following mobility. Pt presents with deficits in strength, endurance, activity tolerance, balance all affecting safe and optimal ADL completion. STR is recommended to address functional deficits. OT will continue to follow acutely.      Recommendations for follow up therapy are one component of a multi-disciplinary discharge planning process, led by the attending physician.  Recommendations may be updated based on patient status, additional functional criteria and insurance authorization.   Follow Up Recommendations  Skilled nursing-short term rehab (<3 hours/day)    Assistance Recommended at Discharge Frequent or constant Supervision/Assistance   Patient can return home with the following Two people to help with walking and/or transfers;Two people to help with bathing/dressing/bathroom    Functional Status Assessment  Patient has had a recent decline in their functional status and demonstrates the ability to make significant improvements in function in a reasonable and predictable amount of time.  Equipment Recommendations  Other (comment) (per next venue of care)    Recommendations for Other Services       Precautions / Restrictions Precautions Precautions: Fall Precaution Comments: wound vac RLE Restrictions Weight Bearing Restrictions: No      Mobility Bed Mobility Overal bed mobility: Needs Assistance Bed Mobility: Supine to Sit, Sit to Supine, Rolling Rolling: Max assist, +2 for physical assistance   Supine to sit: Max assist, Total assist, +2 for physical assistance, HOB elevated Sit to supine: Max assist, Total assist, +2 for physical assistance, HOB elevated   General bed mobility comments: step by step vcs for all task participation    Transfers Overall transfer level: Needs assistance                 General transfer comment: unsafe to attempt at this time      Balance Overall balance assessment: Needs assistance Sitting-balance support: Bilateral upper extremity supported Sitting balance-[name]: Fair                                     ADL either performed or assessed with clinical judgement   ADL Overall ADL's : Needs assistance/impaired     Grooming: Moderate assistance;Sitting  Upper Body Dressing : Moderate assistance;Bed level   Lower Body Dressing: Total assistance;Bed level       Toileting- Clothing Manipulation and Hygiene: Total assistance Toileting - Clothing Manipulation Details (indicate cue type and reason): following BM       General ADL Comments: step by step vcs required throughout     Vision Patient Visual Report: No change  from baseline Additional Comments: will continue to assess     Perception     Praxis      Pertinent Vitals/Pain Pain Assessment Pain Assessment: Faces Faces Pain Scale: Hurts little more Pain Location: L groin/LE Pain Descriptors / Indicators: Tender, Discomfort, Grimacing, Guarding, Operative site guarding, Aching Pain Intervention(s): Limited activity within patient's tolerance, Monitored during session, Repositioned, Premedicated before session     Hand Dominance     Extremity/Trunk Assessment Upper Extremity Assessment Upper Extremity Assessment: Generalized weakness   Lower Extremity Assessment Lower Extremity Assessment: Generalized weakness   Cervical / Trunk Assessment Cervical / Trunk Assessment: Other exceptions Cervical / Trunk Exceptions: forward head/shoulders   Communication Communication Communication: No difficulties   Cognition Arousal/Alertness: Awake/alert Behavior During Therapy: Anxious Overall Cognitive Status: No family/caregiver present to determine baseline cognitive functioning Area of Impairment: Orientation, Attention, Memory, Following commands, Safety/judgement, Awareness, Problem solving                 Orientation Level: Disoriented to, Time Current Attention Level: Focused Memory: Decreased short-term memory Following Commands: Follows one step commands inconsistently Safety/Judgement: Decreased awareness of safety, Decreased awareness of deficits Awareness: Intellectual Problem Solving: Slow processing, Decreased initiation, Difficulty sequencing, Requires verbal cues, Requires tactile cues       General Comments  L groin wound vac in place, making a different noise that typically heard by therapists, RN aware; all other lines/leads intact    Exercises     Shoulder Instructions      Home Living Family/patient expects to be discharged to:: Private residence Living Arrangements: Children;Other relatives Available Help at  Discharge: Family Type of Home: Mobile home Home Access: Level entry     Home Layout: One level     Bathroom Shower/Tub: Teacher, early years/pre: Standard Bathroom Accessibility: No   Home Equipment: Wheelchair - manual;Shower seat   Additional Comments: pt is a poor report      Prior Functioning/Environment Prior Level of Function : Needs assist       Physical Assist : Mobility (physical);ADLs (physical)     Mobility Comments: pt reports requiring 1-2 assist for transfer to mwc ADLs Comments: pt reports she uses briefs for toileting, daugther assists with dressing/bathing, pt performs sink baths with assist        OT Problem List: Decreased strength;Impaired balance (sitting and/or standing);Decreased safety awareness;Decreased cognition;Decreased knowledge of use of DME or AE      OT Treatment/Interventions: Self-care/ADL training;Patient/family education;Therapeutic exercise;Balance training;Therapeutic activities;DME and/or AE instruction    OT Goals(Current goals can be found in the care plan section) Acute Rehab OT Goals Patient Stated Goal: decrease pain OT Goal Formulation: With patient Time For Goal Achievement: 08/07/22 Potential to Achieve Goals: Good ADL Goals Pt Will Perform Grooming: with min assist;sitting Pt Will Perform Upper Body Dressing: with min assist;sitting Pt Will Perform Lower Body Dressing: with mod assist;sitting/lateral leans Pt Will Transfer to Toilet: with mod assist;bedside commode Pt Will Perform Toileting - Clothing Manipulation and hygiene: with mod assist;sitting/lateral leans  OT Frequency: Min 2X/week    Co-evaluation PT/OT/SLP Co-Evaluation/Treatment: Yes Reason for Co-Treatment:  Necessary to address cognition/behavior during functional activity;For patient/therapist safety;Complexity of the patient's impairments (multi-system involvement) PT goals addressed during session: Mobility/safety with mobility;Balance OT  goals addressed during session: ADL's and self-care      AM-PAC OT "6 Clicks" Daily Activity     Outcome Measure Help from another person eating meals?: A Lot Help from another person taking care of personal grooming?: A Lot Help from another person toileting, which includes using toliet, bedpan, or urinal?: Total Help from another person bathing (including washing, rinsing, drying)?: A Lot Help from another person to put on and taking off regular upper body clothing?: A Lot Help from another person to put on and taking off regular lower body clothing?: Total 6 Click Score: 10   End of Session Nurse Communication: Mobility status  Activity Tolerance: Patient tolerated treatment well Patient left: in bed;with call bell/phone within reach;with bed alarm set (NT present)  OT Visit Diagnosis: Muscle weakness (generalized) (M62.81);Unsteadiness on feet (R26.81)                Time: 6578-4696 OT Time Calculation (min): 30 min Charges:  OT Evaluation $OT Eval Moderate Complexity: 1 Mod OT Treatments $Self Care/Home Management : 8-22 mins  Shanon Payor, OTD OTR/L  07/24/22, 1:27 PM

## 2022-07-24 NOTE — Progress Notes (Signed)
ANTICOAGULATION CONSULT NOTE  Pharmacy Consult for Heparin infusion Indication: DVT/Ischemic leg   Allergies  Allergen Reactions   Gabapentin     Tremors    Bactrim [Sulfamethoxazole-Trimethoprim] Itching    Patient Measurements: Height: '5\' 2"'$  (157.5 cm) Weight: 80.7 kg (178 lb) IBW/kg (Calculated) : 50.1 Heparin Dosing Weight: 68.1 kg   Vital Signs: Temp: 98.4 F (36.9 C) (10/02 0400) Temp Source: Oral (10/02 0400) BP: 126/87 (10/02 0400) Pulse Rate: 122 (10/02 0400)  Labs: Recent Labs    07/22/22 0030 07/22/22 0630 07/22/22 0730 07/22/22 1751 07/23/22 0030 07/23/22 1450 07/23/22 1719 07/23/22 2210 07/24/22 0445  HGB 11.9*  --   --   --  10.3*  --  10.6*  --  11.8*  HCT 38.1  --   --   --  33.7*  --  34.8*  --  37.8  PLT 237  --   --   --  197  --  167  --  203  APTT 26  --  49*  --   --  162*  --   --  93*  LABPROT 12.5  --   --   --   --   --   --   --   --   INR 0.9  --   --   --   --   --   --   --   --   HEPARINUNFRC 0.12*  --  0.29*   < > 0.72* >1.10*  --  >1.10* 0.90*  CREATININE 0.81  --   --   --   --   --  0.86  --  0.80  TROPONINIHS 4 4  --   --   --   --   --   --   --    < > = values in this interval not displayed.     Estimated Creatinine Clearance: 71.7 mL/min (by C-G formula based on SCr of 0.8 mg/dL).   Medical History: Past Medical History:  Diagnosis Date   Abdominal aortic atherosclerosis ([place])    a. 05/2017 CTA abd/pelvis: significant atherosclerotic dzs of the infrarenal abd Ao w/ some mural thrombus. No aneurysm or dissection.   Acute focal ischemia of small intestine (HCC)    Acute right-sided low back pain with right-sided sciatica 06/13/2017   AKI (acute kidney injury) ([place]) 07/29/2017   Anemia    Anginal pain ([place])    a. 08/2012 Lexiscan MV: EF 54%, non ischemia/infarct.   Anxiety and depression    Baker's cyst of knee, right    a. 07/2016 U/S: 4.1 x 1.4 x 2.9 cystic structure in R poplitetal fossa.   Bell's palsy    CAD  (coronary artery disease)    CHF (congestive heart failure) ([place])    a.) TTE 06/20/2017: EF 60-65%, no rwma, G1DD, no source of cardiac emboli.   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 05/2017 CTA Abd/pelvis: apparent thrombus or embolus in prox SMA (70-90%); b. 05/2017 catheter directed tPA, mechanical thrombectomy, and stenting of the SMA.   Essential hypertension with goal blood pressure less than 140/90 01/19/2016   Gastroesophageal reflux disease 01/19/2016   GERD (gastroesophageal reflux disease)    H/O colectomy  History of 2019 novel coronavirus disease (COVID-19) 11/14/2019   History of kidney stones    Hyperlipidemia    Hypertension    Morbid obesity with BMI of 40.0-44.9, adult (HCC)    NSTEMI (non-ST elevated myocardial infarction) ([place]) 06/27/2021   a.) high sensitivity troponins trended: 893 --> 1587 --> 1748 --> 868 --> 735 ng/L   Occlusive mesenteric ischemia (HCC) 06/19/2017   Osteoarthritis    PAD (peripheral artery disease) (HCC)    PAF (paroxysmal atrial fibrillation) (HCC)    a.) CHA2DS2-VASc = 6 (sex, CHF, HTN, DVT x2, aortic plaque). b.) rate/rhythm maintained on oral metoprolol tartrate; chronically anticoagulated with standard dose apixaban.   Palpitations    Peripheral edema    Personal history of other malignant neoplasm of skin 06/01/2016   Pneumonia    Pre-diabetes    Primary osteoarthritis of both knees 06/13/2017   SBO (small bowel obstruction) ([place]) 08/06/2017   Skin cancer of nose    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HCC)    Vitamin D deficiency     Medications:  Medications Prior to Admission  Medication Sig Dispense Refill Last Dose   amLODipine (NORVASC) 5 MG tablet Take 5 mg by mouth daily.   07/21/2022 at 0800   apixaban (ELIQUIS)  5 MG TABS tablet Take 1 tablet (5 mg total) by mouth 2 (two) times daily. 60 tablet 11 07/21/2022 at 0800   aspirin EC 81 MG tablet Take 81 mg by mouth daily. Swallow whole.   07/21/2022 at 0800   atorvastatin (LIPITOR) 80 MG tablet Take 80 mg by mouth at bedtime.   07/21/2022 at 2000   citalopram (CELEXA) 40 MG tablet Take 1 tablet (40 mg total) by mouth daily. 30 tablet 2 07/21/2022 at 0800   COMBIVENT RESPIMAT 20-100 MCG/ACT AERS respimat    prn at prn   esomeprazole (NEXIUM) 20 MG capsule Take 20 mg by mouth daily.   07/21/2022 at 0800   ferrous sulfate 325 (65 FE) MG tablet Take 325 mg by mouth daily.   07/21/2022 at 0800   furosemide (LASIX) 40 MG tablet Take 60 mg by mouth daily.   07/21/2022 at 0800   isosorbide mononitrate (IMDUR) 60 MG 24 hr tablet Take 60 mg by mouth daily.   07/21/2022 at 0800   metoprolol tartrate (LOPRESSOR) 50 MG tablet Take 50 mg by mouth 2 (two) times daily.   07/21/2022 at 2000   Multiple Vitamin (MULTIVITAMIN WITH MINERALS) TABS tablet Take 1 tablet by mouth daily. 30 tablet 0 07/21/2022 at 0800   omeprazole (PRILOSEC) 20 MG capsule Take 20 mg by mouth 2 (two) times daily before a meal.   07/21/2022 at 2000   ondansetron (ZOFRAN) 4 MG tablet Take 4 mg by mouth every 8 (eight) hours as needed for nausea or vomiting.   prn at prn   oxyCODONE-acetaminophen (PERCOCET/ROXICET) 5-325 MG tablet Take 1 tablet by mouth every 6 (six) hours as needed for moderate pain. 40 tablet 0 prn at prn   pregabalin (LYRICA) 100 MG capsule Take 1 capsule (100 mg total) by mouth 3 (three) times daily. 90 capsule 3 07/21/2022 at 2000   Amino Acids-Protein Hydrolys (PRO-STAT) LIQD Take 30 mLs by mouth in the morning and at bedtime. (Patient not taking: Reported on 07/22/2022)   Not Taking   feeding supplement (ENSURE ENLIVE / ENSURE PLUS) LIQD Take 237 mLs by mouth 2 (two) times daily between meals. 237 mL 12    methocarbamol (ROBAXIN) 500 MG tablet Take 1 tablet (  500 mg total) by mouth every 8 (eight)  hours as needed for muscle spasms. (Patient not taking: Reported on 07/22/2022) 30 tablet 0 Not Taking    Assessment: Pharmacy consulted to dose heparin in this 62 year old female admitted with ischemic leg.   Pt reports taking Eliquis 5 mg PO BID PTA with last dose on 9/29 @ 2000.    Goal of Therapy:  Heparin level 0.5 - 1.0 units/mL per vascular   Monitor platelets by anticoagulation protocol: Yes  Documentation:  Critically elevated aPTT at 162 with correlating anti-xa level of >1.10 s/p thrombectomy on 10/1 at 1600. Vascular believes this may be secondary to heparin 8,000 unit bolus given at 1400 during procedure. Heparin infusion rate was increased from 1400 to 1800 units/hr. Instructed by vascular surgeon to continue heparin infusion rate as is. Vascular also requests that we refrain from holding infusion. Goal heparin level increased to 0.5 - 1.0 units/mL. If heparin level supratherapeutic, only decrease rate.  Plan: heparin level therapeutic --continue heparin at 1600 units/hr --recheck heparin level an aPTT once daily with am labs --daily CBC while on IV heparin  Dallie Piles 07/24/2022,7:08 AM

## 2022-07-24 NOTE — Progress Notes (Signed)
[facility]  Daily Progress Note   Subjective  -   Patient resting, granddaughter at bedside with questions  Objective Vitals:   07/24/22 2100 07/24/22 2130 07/24/22 2200 07/24/22 2300  BP: 107/76 116/75 102/74 129/84  Pulse: (!) 125 (!) 125 (!) 118 (!) 125  Resp: '14 14 16 15  '$ Temp:      TempSrc:      SpO2: 96% 96% 95% 94%  Weight:      Height:        Intake/Output Summary (Last 24 hours) at 07/24/2022 2332 Last data filed at 07/24/2022 2300 Gross per 24 hour  Intake 2158.79 ml  Output 801 ml  Net 1357.79 ml    PULM  CTAB CV  RRR VASC  dopplerable pulses  Laboratory CBC    Component Value Date/Time   WBC 19.0 (H) 07/24/2022 0445   HGB 11.8 (L) 07/24/2022 0445   HGB 13.4 02/12/2015 1137   HCT 37.8 07/24/2022 0445   HCT 40.9 02/12/2015 1137   PLT 203 07/24/2022 0445   PLT 272 02/12/2015 1137    BMET    Component Value Date/Time   NA 138 07/24/2022 0445   NA 140 02/12/2015 1137   K 4.3 07/24/2022 0445   K 2.9 (L) 02/12/2015 1137   CL 110 07/24/2022 0445   CL 107 02/12/2015 1137   CO2 20 (L) 07/24/2022 0445   CO2 27 02/12/2015 1137   GLUCOSE 158 (H) 07/24/2022 0445   GLUCOSE 120 (H) 02/12/2015 1137   BUN 13 07/24/2022 0445   BUN 15 02/12/2015 1137   CREATININE 0.80 07/24/2022 0445   CREATININE 0.78 02/12/2015 1137   CALCIUM 8.3 (L) 07/24/2022 0445   CALCIUM 9.0 02/12/2015 1137   GFRNONAA >60 07/24/2022 0445   GFRNONAA >60 02/12/2015 1137   GFRAA >60 11/23/2019 1330   GFRAA >60 02/12/2015 1137    Assessment/Planning: POD #1 s/p left axillary to common femoral bypass  Patient resting comfortably no complaints of pain Prevena wound VAC in place with foam dressing Patient has been compliant with Eliquis, if patient has not already had, would recommend hypercoagulable work-up Granddaughter who was at bedside the prognosis post revascularization.  Despite bypass the patient still remains at high risk for amputation, we will  continue to monitor closely.  Kris Hartmann  07/24/2022, 11:32 PM

## 2022-07-24 NOTE — Consult Note (Addendum)
[facility] Nurse Consult Note: Reason for Consult: Requested to assess alarming Prevena by bedside nurse.  Pt had surgery yesterday and is followed by the vascular team. Unable to troubleshoot/resolve the issue.  Discussed with Dr Lucky Cowboy of via secure chat.  Removed Prevena and applied foam dressing to the left groin.  Staples are intact and well approximated over closed post-op wound.  Topical treatment orders provided for bedside nurses as follows: foam dressing to left groin incision, change Q 3 days or PRN soiling. Please refer to vascular team for further plan of care  Please re-consult if further assistance is needed.  Thank-you,  Julien Girt MSN, [place], [place], [facility], [place]

## 2022-07-25 DIAGNOSIS — I70222 Atherosclerosis of native arteries of extremities with rest pain, left leg: Secondary | ICD-10-CM | POA: Diagnosis not present

## 2022-07-25 LAB — GLUCOSE, CAPILLARY
Glucose-Capillary: 183 mg/dL — ABNORMAL HIGH (ref 70–99)
Glucose-Capillary: 189 mg/dL — ABNORMAL HIGH (ref 70–99)
Glucose-Capillary: 201 mg/dL — ABNORMAL HIGH (ref 70–99)
Glucose-Capillary: 132 mg/dL — ABNORMAL HIGH (ref 70–99)
Glucose-Capillary: 138 mg/dL — ABNORMAL HIGH (ref 70–99)

## 2022-07-25 LAB — CBC
HCT: 37.8 % (ref 36.0–46.0)
Hemoglobin: 12 g/dL (ref 12.0–15.0)
MCH: 27.6 pg (ref 26.0–34.0)
MCHC: 31.7 g/dL (ref 30.0–36.0)
MCV: 87.1 fL (ref 80.0–100.0)
Platelets: 202 10*3/uL (ref 150–400)
RBC: 4.34 MIL/uL (ref 3.87–5.11)
RDW: 17.1 % — ABNORMAL HIGH (ref 11.5–15.5)
WBC: 21 10*3/uL — ABNORMAL HIGH (ref 4.0–10.5)
nRBC: 0 % (ref 0.0–0.2)

## 2022-07-25 LAB — BASIC METABOLIC PANEL
Anion gap: 6 (ref 5–15)
BUN: 25 mg/dL — ABNORMAL HIGH (ref 8–23)
CO2: 21 mmol/L — ABNORMAL LOW (ref 22–32)
Calcium: 7.9 mg/dL — ABNORMAL LOW (ref 8.9–10.3)
Chloride: 111 mmol/L (ref 98–111)
Creatinine, Ser: 1.29 mg/dL — ABNORMAL HIGH (ref 0.44–1.00)
GFR, Estimated: 47 mL/min — ABNORMAL LOW (ref >60)
Glucose, Bld: 172 mg/dL — ABNORMAL HIGH (ref 70–99)
Potassium: 4.4 mmol/L (ref 3.5–5.1)
Sodium: 138 mmol/L (ref 135–145)

## 2022-07-25 LAB — APTT
aPTT: 162 seconds (ref 24–36)
aPTT: 127 seconds — ABNORMAL HIGH (ref 24–36)

## 2022-07-25 LAB — SURGICAL PATHOLOGY

## 2022-07-25 LAB — HEPARIN LEVEL (UNFRACTIONATED)
Heparin Unfractionated: 1.08 IU/mL — ABNORMAL HIGH (ref 0.30–0.70)
Heparin Unfractionated: 1.02 IU/mL — ABNORMAL HIGH (ref 0.30–0.70)
Heparin Unfractionated: 0.93 IU/mL — ABNORMAL HIGH (ref 0.30–0.70)

## 2022-07-25 MED ORDER — SODIUM CHLORIDE 0.9 % IV SOLN: INTRAVENOUS | Status: DC

## 2022-07-25 MED ORDER — SODIUM CHLORIDE 0.9 % IV BOLUS: 250.0000 mL | Freq: Once | INTRAVENOUS | Status: DC

## 2022-07-25 MED ORDER — SODIUM CHLORIDE 0.9 % IV SOLN
2.0000 g | Freq: Two times a day (BID) | INTRAVENOUS | Status: DC
  Administered 2022-07-25 – 2022-07-27 (×4): 2 g via INTRAVENOUS
  Filled 2022-07-25 (×4): qty 12.5
  Filled 2022-07-25: qty 2
  Filled 2022-07-25: qty 12.5

## 2022-07-25 MED ORDER — LACTATED RINGERS IV SOLN: INTRAVENOUS | Status: DC

## 2022-07-25 MED ORDER — SODIUM CHLORIDE 0.9 % IV BOLUS
500.0000 mL | Freq: Once | INTRAVENOUS | Status: AC
  Administered 2022-07-25: 500 mL via INTRAVENOUS

## 2022-07-25 NOTE — Progress Notes (Addendum)
PROGRESS NOTE    [name]  HLK:[id number] DOB: 1961-05-25 DOA: 07/22/2022 PCP: [facility] [place]    Brief Narrative:  LEEAH [name] is a 61 y.o. female with medical history significant for peripheral arterial disease s/p right AKA, chronic diastolic dysfunction CHF, anxiety and depression, COPD, paroxysmal A-fib on chronic anticoagulation therapy, who presents to the ED with left leg pain for the past 1 week but has been progressively worsening.   CTA found with ischemic LLE. See cta results below  10/1 pt went to OR, s/p vascular procedure, post OR woke up c/o leg pain, mottled leg, went back to OR s/p thrombectomy of Left axillary femoral bypass graft with revision. Transferred to ICU.   10/2 awake today. Has some toe pain but no sob or cp   Consultants:  Vascular surgery  Procedures:  CTA VASCULAR   Occlusion of the aorto bi-iliac stents with distal reconstitution of the external iliac/common femoral arteries.   Patent three-vessel runoff of the left lower extremity to the level of the calf where there is non opacification of the distal lower extremity arteries. This may be due to slow flow however occlusion is not excluded.   Occluded SMA stent at the origin with distal reconstitution.   NON-VASCULAR   Right above-the-knee amputation. At the site of amputation there is a fluid collection concerning for abscess.   Large ventral abdominal wall hernia containing nonobstructive loops of bowel.       Antimicrobials:  Cefazolin x3 doses  Cefepime 10/2   Subjective: As above  Objective: Vitals:   07/25/22 1200 07/25/22 1300 07/25/22 1400 07/25/22 1500  BP: 113/72  95/70   Pulse: 93 94 92 (!) 102  Resp: '14 11 13 12  '$ Temp:      TempSrc:      SpO2: 93% 94% 95% 94%  Weight:      Height:        Intake/Output Summary (Last 24 hours) at 07/25/2022 1625 Last data filed at 07/25/2022 1532 Gross per 24 hour  Intake 4001.57 ml  Output 625 ml   Net 3376.57 ml   Filed Weights   07/22/22 0029  Weight: 80.7 kg    Examination: Calm, NAD Cta no w/r Reg s1/s2 no gallop Soft benign +bs LLE edema, less mottled toes. RT stump swelling.  Awake and alert.  Mood and affect appropriate in current setting      Data Reviewed: I have personally reviewed following labs and imaging studies  CBC: Recent Labs  Lab 07/22/22 0030 07/23/22 0030 07/23/22 1719 07/24/22 0445 07/25/22 0355  WBC 8.3 6.3 12.2* 19.0* 21.0*  NEUTROABS 4.8  --   --   --   --   HGB 11.9* 10.3* 10.6* 11.8* 12.0  HCT 38.1 33.7* 34.8* 37.8 37.8  MCV 88.4 90.6 89.7 86.7 87.1  PLT 237 197 167 203 734   Basic Metabolic Panel: Recent Labs  Lab 07/22/22 0030 07/23/22 1719 07/24/22 0445 07/25/22 0354  NA 143 137 138 138  K 3.9 4.5 4.3 4.4  CL 108 109 110 111  CO2 25 20* 20* 21*  GLUCOSE 102* 230* 158* 172*  BUN '12 11 13 '$ 25*  CREATININE 0.81 0.86 0.80 1.29*  CALCIUM 8.9 8.4* 8.3* 7.9*   GFR: Estimated Creatinine Clearance: 44.5 mL/min (A) (by C-G formula based on SCr of 1.29 mg/dL (H)). Liver Function Tests: Recent Labs  Lab 07/22/22 0030  AST 13*  ALT 10  ALKPHOS 90  BILITOT 0.5  PROT  6.7  ALBUMIN 3.1*   No results for input(s): "LIPASE", "AMYLASE" in the last 168 hours. No results for input(s): "AMMONIA" in the last 168 hours. Coagulation Profile: Recent Labs  Lab 07/22/22 0030  INR 0.9   Cardiac Enzymes: No results for input(s): "CKTOTAL", "CKMB", "CKMBINDEX", "TROPONINI" in the last 168 hours. BNP (last 3 results) No results for input(s): "PROBNP" in the last 8760 hours. HbA1C: Recent Labs    07/23/22 1719  HGBA1C 6.2*   CBG: Recent Labs  Lab 07/24/22 2335 07/25/22 0354 07/25/22 0811 07/25/22 1213 07/25/22 1526  GLUCAP 155* 183* 189* 201* 132*   Lipid Profile: No results for input(s): "CHOL", "HDL", "LDLCALC", "TRIG", "CHOLHDL", "LDLDIRECT" in the last 72 hours. Thyroid Function Tests: No results for input(s):  "TSH", "T4TOTAL", "FREET4", "T3FREE", "THYROIDAB" in the last 72 hours. Anemia Panel: No results for input(s): "VITAMINB12", "FOLATE", "FERRITIN", "TIBC", "IRON", "RETICCTPCT" in the last 72 hours. Sepsis Labs: No results for input(s): "PROCALCITON", "LATICACIDVEN" in the last 168 hours.  Recent Results (from the past 240 hour(s))  MRSA Next Gen by PCR, Nasal     Status: Abnormal   Collection Time: 07/23/22 12:17 AM   Specimen: Nasal Mucosa; Nasal Swab  Result Value Ref Range Status   MRSA by PCR Next Gen DETECTED (A) NOT DETECTED Final    Comment: RESULT CALLED TO, READ BACK BY AND VERIFIED WITH: Clide Cliff @ [phone] on 07/23/2022 by CAF (NOTE) The GeneXpert MRSA Assay (FDA approved for NASAL specimens only), is one component of a comprehensive MRSA colonization surveillance program. It is not intended to diagnose MRSA infection nor to guide or monitor treatment for MRSA infections. Test performance is not FDA approved in patients less than 32 years old. Performed at [facility], [address], [place], [place] [address]          Radiology Studies: No results found.      Scheduled Meds:  aspirin EC  81 mg Oral Q0600   atorvastatin  80 mg Oral QHS   Chlorhexidine Gluconate Cloth  6 each Topical Daily   citalopram  40 mg Oral Daily   docusate sodium  100 mg Oral Daily   insulin aspart  0-15 Units Subcutaneous Q4H   isosorbide mononitrate  60 mg Oral Daily   mupirocin ointment  1 Application Nasal BID   pantoprazole  40 mg Oral Daily   pregabalin  100 mg Oral TID   Continuous Infusions:  sodium chloride     ceFEPime (MAXIPIME) IV     heparin 1,400 Units/hr (07/25/22 1619)   lactated ringers 75 mL/hr at 07/25/22 1532   magnesium sulfate bolus IVPB      Assessment & Plan:   Principal Problem:   Critical limb ischemia of left lower extremity (HCC) Active Problems:   AKI (acute kidney injury) ([place])   PAF (paroxysmal atrial fibrillation) (HCC)    Chronic diastolic CHF (congestive heart failure) (HCC)   Abscess of knee, right   * Critical limb ischemia of left lower extremity (HCC) Peripheral artery disease s/p right BKA  on heparin gtt, eliquis held S/p OR initially for Left axillary to common femoral artery bypass graft, Prevena wound VAC placed .  Post surgery patient had left foot pain and mottled leg was taken to the OR again , now status post thrombectomy of left axillofemoral bypass graft 10/3 primary management per vascular      PAF (paroxysmal atrial fibrillation) (HCC) Eliquis was held for surgery On heparin drip Start  Eliquis  when cleared by vascular Continue beta-blockers       Chronic diastolic CHF (congestive heart failure) (HCC) Compensated, continue beta blk and Imdur   Rt knee fluid collection At site of amputation, found on CT concern for abscess Started on Cefepime Dr. Lucky Cowboy made aware and will f/u .   AKI Prerenal Continue on ivf Monitor  Avoid nephrotoxic meds  DVT prophylaxis: Heparin infusion Code Status: Full Family Communication: None at bedside Disposition Plan: TBD Status is: Inpatient Remains inpatient appropriate because: IV treatment.  Needs clearance by vascular     LOS: 3 days   Time spent: 35 min    Nolberto Hanlon, MD [facility] Pager [phone]  If 7PM-7AM, please contact night-coverage 07/25/2022, 4:25 PM

## 2022-07-25 NOTE — Progress Notes (Signed)
       CROSS COVER NOTE  NAME: SHAUNE MALACARA MRN: [id number] DOB : Feb 11, 1961    Date of Service   07/25/2022   HPI/Events of Note   Notified of ongoing tachycardia HR 120s, HR has been in this range throughout the day, patient has history of paroxysmal AFIB. RN reports patient has had 50 mL of UOP this shift.  Interventions   Assessment/Plan:  NS 500 mL at 250 mL/hr    This document was prepared using Dragon voice recognition software and may include unintentional dictation errors.  Neomia Glass DNP, MHA, FNP-BC Nurse Practitioner [facility] Pager [phone]

## 2022-07-25 NOTE — Progress Notes (Signed)
[facility]  Daily Progress Note   Subjective  -   Patient resting comfortably.  No complaints of pain.  Visit conducted jointly with Dr. Lucky Cowboy  Objective Vitals:   07/25/22 2000 07/25/22 2100 07/25/22 2200 07/25/22 2300  BP: 1'06/68 95/64 96/61 '$   Pulse: 89 96 88   Resp: '12 12 14 12  '$ Temp: 99 F (37.2 C)     TempSrc: Oral     SpO2: 96% 95% 96%   Weight:      Height:        Intake/Output Summary (Last 24 hours) at 07/25/2022 2339 Last data filed at 07/25/2022 2300 Gross per 24 hour  Intake 5697.08 ml  Output 725 ml  Net 4972.08 ml    PULM  CTAB CV  RRR VASC  right foot warm, 1+ edema  Laboratory CBC    Component Value Date/Time   WBC 21.0 (H) 07/25/2022 0355   HGB 12.0 07/25/2022 0355   HGB 13.4 02/12/2015 1137   HCT 37.8 07/25/2022 0355   HCT 40.9 02/12/2015 1137   PLT 202 07/25/2022 0355   PLT 272 02/12/2015 1137    BMET    Component Value Date/Time   NA 138 07/25/2022 0354   NA 140 02/12/2015 1137   K 4.4 07/25/2022 0354   K 2.9 (L) 02/12/2015 1137   CL 111 07/25/2022 0354   CL 107 02/12/2015 1137   CO2 21 (L) 07/25/2022 0354   CO2 27 02/12/2015 1137   GLUCOSE 172 (H) 07/25/2022 0354   GLUCOSE 120 (H) 02/12/2015 1137   BUN 25 (H) 07/25/2022 0354   BUN 15 02/12/2015 1137   CREATININE 1.29 (H) 07/25/2022 0354   CREATININE 0.78 02/12/2015 1137   CALCIUM 7.9 (L) 07/25/2022 0354   CALCIUM 9.0 02/12/2015 1137   GFRNONAA 47 (L) 07/25/2022 0354   GFRNONAA >60 02/12/2015 1137   GFRAA >60 11/23/2019 1330   GFRAA >60 02/12/2015 1137    Assessment/Planning: POD #2 s/p left axillary to common femoral bypass  Patient resting gradually, left lower extremity is warm with dopplerable pulses Groin is clean dry and intact with no evidence of hematoma Continue heparin pending possible intervention if necessary on patient's right lower extremity above-knee amputation with concern for abscess  Carla [name]  07/25/2022, 11:39 PM

## 2022-07-25 NOTE — Anesthesia Postprocedure Evaluation (Signed)
Anesthesia Post Note  Patient: Carla [name]  Procedure(s) Performed: BYPASS GRAFT AXILLA-BIFEMORAL (Left) APPLICATION OF WOUND VAC (Left: Groin)  Patient location during evaluation: SICU Anesthesia Type: General Level of consciousness: awake and alert Pain management: pain level controlled Vital Signs Assessment: post-procedure vital signs reviewed and stable Respiratory status: spontaneous breathing and patient connected to nasal cannula oxygen Cardiovascular status: pt recieved 573m fluid bolus for tachycardia, HR WNL at time of postop. Postop Assessment: no apparent nausea or vomiting Anesthetic complications: no   No notable events documented.   Last Vitals:  Vitals:   07/25/22 0600 07/25/22 0700  BP: 130/85 127/89  Pulse: (!) 119 92  Resp: 12 11  Temp:    SpO2: 94% 96%    Last Pain:  Vitals:   07/25/22 0700  TempSrc:   PainSc: Asleep                 DHedda Slade

## 2022-07-25 NOTE — Progress Notes (Signed)
ANTICOAGULATION CONSULT NOTE  Pharmacy Consult for Heparin infusion Indication: DVT/Ischemic leg   Allergies  Allergen Reactions   Gabapentin     Tremors    Bactrim [Sulfamethoxazole-Trimethoprim] Itching    Patient Measurements: Height: '5\' 2"'$  (157.5 cm) Weight: 80.7 kg (178 lb) IBW/kg (Calculated) : 50.1 Heparin Dosing Weight: 68.1 kg   Vital Signs: Temp: 99 F (37.2 C) (10/03 0200) Temp Source: Oral (10/03 0200) BP: 127/89 (10/03 0700) Pulse Rate: 92 (10/03 0700)  Labs: Recent Labs    07/23/22 1450 07/23/22 1719 07/23/22 2210 07/24/22 0445 07/24/22 1108 07/25/22 0354 07/25/22 0355  HGB  --  10.6*  --  11.8*  --   --  12.0  HCT  --  34.8*  --  37.8  --   --  37.8  PLT  --  167  --  203  --   --  202  APTT 162*  --   --  93*  --   --  162*  HEPARINUNFRC >1.10*  --    < > 0.90* 0.60  --  1.08*  CREATININE  --  0.86  --  0.80  --  1.29*  --    < > = values in this interval not displayed.     Estimated Creatinine Clearance: 44.5 mL/min (A) (by C-G formula based on SCr of 1.29 mg/dL (H)).   Medical History: Past Medical History:  Diagnosis Date   Abdominal aortic atherosclerosis ([place])    a. 05/2017 CTA abd/pelvis: significant atherosclerotic dzs of the infrarenal abd Ao w/ some mural thrombus. No aneurysm or dissection.   Acute focal ischemia of small intestine (HCC)    Acute right-sided low back pain with right-sided sciatica 06/13/2017   AKI (acute kidney injury) ([place]) 07/29/2017   Anemia    Anginal pain ([place])    a. 08/2012 Lexiscan MV: EF 54%, non ischemia/infarct.   Anxiety and depression    Baker's cyst of knee, right    a. 07/2016 U/S: 4.1 x 1.4 x 2.9 cystic structure in R poplitetal fossa.   Bell's palsy    CAD (coronary artery disease)    CHF (congestive heart failure) ([place])    a.) TTE 06/20/2017: EF 60-65%, no rwma, G1DD, no source of cardiac emboli.   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 05/2017 CTA Abd/pelvis: apparent thrombus or embolus in prox SMA (70-90%); b. 05/2017 catheter directed tPA, mechanical thrombectomy, and stenting of the SMA.   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 b.) rate/rhythm maintained on oral  metoprolol tartrate; chronically anticoagulated with standard dose apixaban.   Palpitations    Peripheral edema    Personal history of other malignant neoplasm of skin 06/01/2016   Pneumonia    Pre-diabetes    Primary osteoarthritis of both knees 06/13/2017   SBO (small bowel obstruction) ([place]) 08/06/2017   Skin cancer of nose    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HCC)    Vitamin D deficiency     Medications:  Medications Prior to Admission  Medication Sig Dispense Refill Last Dose   amLODipine (NORVASC) 5 MG tablet Take 5 mg by mouth daily.   07/21/2022 at 0800   apixaban (ELIQUIS) 5 MG TABS tablet Take 1 tablet (5 mg total) by mouth 2 (two) times daily. 60 tablet 11 07/21/2022 at 0800   aspirin EC 81 MG tablet Take 81 mg by mouth daily. Swallow whole.   07/21/2022 at 0800   atorvastatin (LIPITOR) 80 MG tablet  Take 80 mg by mouth at bedtime.   07/21/2022 at 2000   citalopram (CELEXA) 40 MG tablet Take 1 tablet (40 mg total) by mouth daily. 30 tablet 2 07/21/2022 at 0800   COMBIVENT RESPIMAT 20-100 MCG/ACT AERS respimat    prn at prn   esomeprazole (NEXIUM) 20 MG capsule Take 20 mg by mouth daily.   07/21/2022 at 0800   ferrous sulfate 325 (65 FE) MG tablet Take 325 mg by mouth daily.   07/21/2022 at 0800   furosemide (LASIX) 40 MG tablet Take 60 mg by mouth daily.   07/21/2022 at 0800   isosorbide mononitrate (IMDUR) 60 MG 24 hr tablet Take 60 mg by mouth daily.   07/21/2022 at 0800   metoprolol tartrate (LOPRESSOR) 50 MG tablet Take 50 mg by mouth 2 (two) times daily.   07/21/2022 at 2000   Multiple Vitamin (MULTIVITAMIN WITH MINERALS) TABS tablet Take 1 tablet by mouth daily. 30 tablet 0 07/21/2022 at 0800   omeprazole (PRILOSEC) 20 MG capsule Take 20 mg by mouth 2 (two) times daily before a meal.   07/21/2022 at 2000   ondansetron (ZOFRAN) 4 MG tablet Take 4 mg by mouth every 8 (eight) hours as needed for nausea or vomiting.   prn at prn   oxyCODONE-acetaminophen (PERCOCET/ROXICET) 5-325 MG tablet Take 1 tablet by mouth every 6 (six) hours as needed for moderate pain. 40 tablet 0 prn at prn   pregabalin (LYRICA) 100 MG capsule Take 1 capsule (100 mg total) by mouth 3 (three) times daily. 90 capsule 3 07/21/2022 at 2000   feeding supplement (ENSURE ENLIVE / ENSURE PLUS) LIQD Take 237 mLs by mouth 2 (two) times daily between meals. 237 mL 12     Assessment: Pharmacy consulted to dose heparin in this 61 year old female admitted with ischemic leg.   Pt reports taking Eliquis 5 mg PO BID PTA with last dose on 9/29 @ 2000.    Goal of Therapy:  Heparin level 0.5 - 1.0 units/mL per vascular   Monitor platelets by anticoagulation protocol: Yes  Documentation:  Critically elevated aPTT at 162 with correlating anti-xa level of >1.10 s/p thrombectomy on 10/1 at 1600. Vascular believes this may be secondary to heparin  8,000 unit bolus given at 1400 during procedure. Heparin infusion rate was increased from 1400 to 1800 units/hr. Instructed by vascular surgeon to continue heparin infusion rate as is. Vascular also requests that we refrain from holding infusion. Goal heparin level increased to 0.5 - 1.0 units/mL. If heparin level supratherapeutic, only decrease rate.  Plan: heparin level supratherapeutic (aPTT and anti-Xa level close to correlation) --decrease heparin rate to 1400 units/hr --recheck heparin level in 8 hr after rate change --daily CBC while on IV heparin  Vallery Sa, PharmD, BCPS 07/25/2022 7:17 AM

## 2022-07-25 NOTE — Plan of Care (Signed)
Care plan reviewed with pt and family.   Problem: Elimination: Goal: Will not experience complications related to bowel motility Outcome: Not Progressing Note: Pt had 275 mls of urine output in 12hrs MD aware   Problem: Nutritional: Goal: Progress toward achieving an optimal weight will improve Outcome: Not Progressing  Problem: Nutrition: Goal: Adequate nutrition will be maintained Outcome: Not Progressing   Problem: Health Behavior/Discharge Planning: Goal: Ability to manage health-related needs will improve Outcome: Not Progressing   Problem: Education: Goal: Knowledge of General Education information will improve Description: Including pain rating scale, medication(s)/side effects and non-pharmacologic comfort measures Outcome: Not Progressing. Pt refused meal all day but drinking fluid. Glucerna ensure given to pt and she tolerated it well. Pt urine out was 275 mls in 12 hours, bladder scan wit zero residual each time. Foley flushed per protocol.  MD made aware.

## 2022-07-25 NOTE — Progress Notes (Signed)
ANTICOAGULATION CONSULT NOTE  Pharmacy Consult for Heparin infusion Indication: DVT/Ischemic leg   Allergies  Allergen Reactions   Gabapentin     Tremors    Bactrim [Sulfamethoxazole-Trimethoprim] Itching    Patient Measurements: Height: '5\' 2"'$  (157.5 cm) Weight: 80.7 kg (178 lb) IBW/kg (Calculated) : 50.1 Heparin Dosing Weight: 68.1 kg   Vital Signs: Temp: 99 F (37.2 C) (10/03 2000) Temp Source: Oral (10/03 2000) BP: 96/61 (10/03 2200) Pulse Rate: 88 (10/03 2200)  Labs: Recent Labs    07/23/22 1719 07/23/22 2210 07/24/22 0445 07/24/22 1108 07/25/22 0354 07/25/22 0355 07/25/22 1309 07/25/22 2207  HGB 10.6*  --  11.8*  --   --  12.0  --   --   HCT 34.8*  --  37.8  --   --  37.8  --   --   PLT 167  --  203  --   --  202  --   --   APTT  --   --  93*  --   --  162* 127*  --   HEPARINUNFRC  --    < > 0.90*   < >  --  1.08* 1.02* 0.93*  CREATININE 0.86  --  0.80  --  1.29*  --   --   --    < > = values in this interval not displayed.     Estimated Creatinine Clearance: 44.5 mL/min (A) (by C-G formula based on SCr of 1.29 mg/dL (H)).   Medical History: Past Medical History:  Diagnosis Date   Abdominal aortic atherosclerosis ([place])    a. 05/2017 CTA abd/pelvis: significant atherosclerotic dzs of the infrarenal abd Ao w/ some mural thrombus. No aneurysm or dissection.   Acute focal ischemia of small intestine (HCC)    Acute right-sided low back pain with right-sided sciatica 06/13/2017   AKI (acute kidney injury) ([place]) 07/29/2017   Anemia    Anginal pain ([place])    a. 08/2012 Lexiscan MV: EF 54%, non ischemia/infarct.   Anxiety and depression    Baker's cyst of knee, right    a. 07/2016 U/S: 4.1 x 1.4 x 2.9 cystic structure in R poplitetal fossa.   Bell's palsy    CAD (coronary artery disease)    CHF (congestive heart failure) ([place])    a.) TTE 06/20/2017: EF 60-65%, no rwma, G1DD, no source of cardiac emboli.   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 05/2017 CTA Abd/pelvis: apparent thrombus or embolus in prox SMA (70-90%); b. 05/2017 catheter directed tPA, mechanical thrombectomy, and stenting of the SMA.   Essential hypertension with goal blood pressure less than 140/90 01/19/2016   Gastroesophageal reflux disease 01/19/2016   GERD (gastroesophageal reflux disease)    H/O colectomy    History of 2019 novel coronavirus disease (COVID-19) 11/14/2019   History of kidney stones    Hyperlipidemia    Hypertension    Morbid obesity with BMI of 40.0-44.9, adult ([place])    NSTEMI (non-ST elevated myocardial infarction) ([place]) 06/27/2021   a.) high sensitivity troponins trended: 893 --> 1587 --> 1748 --> 868 --> 735 ng/L   Occlusive mesenteric ischemia ([place]) 06/19/2017   Osteoarthritis    PAD (peripheral artery disease) (HCC)    PAF (paroxysmal atrial  fibrillation) ([place])    a.) CHA2DS2-VASc = 6 (sex, CHF, HTN, DVT x2, aortic plaque). b.) rate/rhythm maintained on oral metoprolol tartrate; chronically anticoagulated with standard dose apixaban.   Palpitations    Peripheral edema    Personal history of other malignant neoplasm of skin 06/01/2016   Pneumonia    Pre-diabetes    Primary osteoarthritis of both knees 06/13/2017   SBO (small bowel obstruction) ([place]) 08/06/2017   Skin cancer of nose    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HCC)    Vitamin D deficiency     Medications:  Medications Prior to Admission  Medication Sig Dispense Refill Last Dose   amLODipine (NORVASC) 5 MG tablet Take 5 mg by mouth daily.   07/21/2022 at 0800   apixaban (ELIQUIS) 5 MG TABS tablet Take 1 tablet (5 mg total) by mouth 2 (two) times daily. 60 tablet 11 07/21/2022 at 0800   aspirin EC 81 MG tablet Take 81 mg by mouth daily. Swallow whole.   07/21/2022 at 0800    atorvastatin (LIPITOR) 80 MG tablet Take 80 mg by mouth at bedtime.   07/21/2022 at 2000   citalopram (CELEXA) 40 MG tablet Take 1 tablet (40 mg total) by mouth daily. 30 tablet 2 07/21/2022 at 0800   COMBIVENT RESPIMAT 20-100 MCG/ACT AERS respimat    prn at prn   esomeprazole (NEXIUM) 20 MG capsule Take 20 mg by mouth daily.   07/21/2022 at 0800   ferrous sulfate 325 (65 FE) MG tablet Take 325 mg by mouth daily.   07/21/2022 at 0800   furosemide (LASIX) 40 MG tablet Take 60 mg by mouth daily.   07/21/2022 at 0800   isosorbide mononitrate (IMDUR) 60 MG 24 hr tablet Take 60 mg by mouth daily.   07/21/2022 at 0800   metoprolol tartrate (LOPRESSOR) 50 MG tablet Take 50 mg by mouth 2 (two) times daily.   07/21/2022 at 2000   Multiple Vitamin (MULTIVITAMIN WITH MINERALS) TABS tablet Take 1 tablet by mouth daily. 30 tablet 0 07/21/2022 at 0800   omeprazole (PRILOSEC) 20 MG capsule Take 20 mg by mouth 2 (two) times daily before a meal.   07/21/2022 at 2000   ondansetron (ZOFRAN) 4 MG tablet Take 4 mg by mouth every 8 (eight) hours as needed for nausea or vomiting.   prn at prn   oxyCODONE-acetaminophen (PERCOCET/ROXICET) 5-325 MG tablet Take 1 tablet by mouth every 6 (six) hours as needed for moderate pain. 40 tablet 0 prn at prn   pregabalin (LYRICA) 100 MG capsule Take 1 capsule (100 mg total) by mouth 3 (three) times daily. 90 capsule 3 07/21/2022 at 2000   feeding supplement (ENSURE ENLIVE / ENSURE PLUS) LIQD Take 237 mLs by mouth 2 (two) times daily between meals. 237 mL 12     Assessment: Pharmacy consulted to dose heparin in this 61 year old female admitted with ischemic leg.   Pt reports taking Eliquis 5 mg PO BID PTA with last dose on 9/29 @ 2000.    Goal of Therapy:  Heparin level 0.5 - 1.0 units/mL per vascular   Monitor platelets by anticoagulation protocol: Yes  Documentation:  Critically elevated aPTT at 162 with correlating anti-xa level of >1.10 s/p thrombectomy on 10/1 at 1600. Vascular  believes this may be secondary to heparin 8,000 unit bolus given at 1400 during procedure. Heparin infusion rate was increased from 1400 to 1800 units/hr. Instructed by vascular surgeon to continue heparin infusion rate as is. Vascular also requests that  we refrain from holding infusion. Goal heparin level increased to 0.5 - 1.0 units/mL. If heparin level supratherapeutic, only decrease rate.  Plan: heparin level therapeutic x 1 (aPTT and anti-Xa level close to correlation) --Continue heparin rate to 1400 units/hr --recheck heparin llvl w/ AM labs to confirm --daily CBC while on IV heparin  Renda Rolls, PharmD, [facility] 07/25/2022 10:44 PM

## 2022-07-25 NOTE — Progress Notes (Signed)
PHARMACY NOTE:  ANTIMICROBIAL RENAL DOSAGE ADJUSTMENT  Current antimicrobial regimen includes a mismatch between antimicrobial dosage and estimated renal function.  As per policy approved by the Pharmacy & Therapeutics and Medical Executive Committees, the antimicrobial dosage will be adjusted accordingly.  Current antimicrobial dosage:  cefepime 2 grams IV every 8 hours  Indication: cellulitis  Renal Function:  Estimated Creatinine Clearance: 44.5 mL/min (A) (by C-G formula based on SCr of 1.29 mg/dL (H)).    Antimicrobial dosage has been changed to:   cefepime 2 grams IV every 12 hours   Thank you for allowing pharmacy to be a part of this patient's care.  Dallie Piles, [facility] 07/25/2022 7:29 AM

## 2022-07-25 NOTE — Progress Notes (Signed)
ANTICOAGULATION CONSULT NOTE  Pharmacy Consult for Heparin infusion Indication: DVT/Ischemic leg   Allergies  Allergen Reactions   Gabapentin     Tremors    Bactrim [Sulfamethoxazole-Trimethoprim] Itching    Patient Measurements: Height: '5\' 2"'$  (157.5 cm) Weight: 80.7 kg (178 lb) IBW/kg (Calculated) : 50.1 Heparin Dosing Weight: 68.1 kg   Vital Signs: Temp: 99 F (37.2 C) (10/03 0200) Temp Source: Oral (10/03 0200) BP: 137/94 (10/03 0400) Pulse Rate: 118 (10/03 0400)  Labs: Recent Labs    07/22/22 0630 07/22/22 0730 07/22/22 1751 07/23/22 1450 07/23/22 1719 07/23/22 2210 07/24/22 0445 07/24/22 1108 07/25/22 0355  HGB  --   --    < >  --  10.6*  --  11.8*  --  12.0  HCT  --   --    < >  --  34.8*  --  37.8  --  37.8  PLT  --   --    < >  --  167  --  203  --  202  APTT  --  49*  --  162*  --   --  93*  --   --   HEPARINUNFRC  --  0.29*   < > >1.10*  --    < > 0.90* 0.60 1.08*  CREATININE  --   --   --   --  0.86  --  0.80  --   --   TROPONINIHS 4  --   --   --   --   --   --   --   --    < > = values in this interval not displayed.     Estimated Creatinine Clearance: 71.7 mL/min (by C-G formula based on SCr of 0.8 mg/dL).   Medical History: Past Medical History:  Diagnosis Date   Abdominal aortic atherosclerosis ([place])    a. 05/2017 CTA abd/pelvis: significant atherosclerotic dzs of the infrarenal abd Ao w/ some mural thrombus. No aneurysm or dissection.   Acute focal ischemia of small intestine (HCC)    Acute right-sided low back pain with right-sided sciatica 06/13/2017   AKI (acute kidney injury) ([place]) 07/29/2017   Anemia    Anginal pain ([place])    a. 08/2012 Lexiscan MV: EF 54%, non ischemia/infarct.   Anxiety and depression    Baker's cyst of knee, right    a. 07/2016 U/S: 4.1 x 1.4 x 2.9 cystic structure in R poplitetal fossa.   Bell's palsy    CAD (coronary artery disease)    CHF (congestive heart failure) ([place])    a.) TTE 06/20/2017: EF 60-65%, no  rwma, G1DD, no source of cardiac emboli.   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 05/2017 CTA Abd/pelvis: apparent thrombus or embolus in prox SMA (70-90%); b. 05/2017 catheter directed tPA, mechanical thrombectomy, and stenting of the SMA.   Essential hypertension with goal blood pressure less than 140/90 01/19/2016   Gastroesophageal reflux disease 01/19/2016   GERD (gastroesophageal reflux disease)    H/O colectomy    History of 2019 novel coronavirus disease (COVID-19) 11/14/2019   History of kidney stones    Hyperlipidemia    Hypertension    Morbid obesity with BMI of 40.0-44.9, adult ([place])  NSTEMI (non-ST elevated myocardial infarction) ([place]) 06/27/2021   a.) high sensitivity troponins trended: 893 --> 1587 --> 1748 --> 868 --> 735 ng/L   Occlusive mesenteric ischemia (HCC) 06/19/2017   Osteoarthritis    PAD (peripheral artery disease) (HCC)    PAF (paroxysmal atrial fibrillation) (HCC)    a.) CHA2DS2-VASc = 6 (sex, CHF, HTN, DVT x2, aortic plaque). b.) rate/rhythm maintained on oral metoprolol tartrate; chronically anticoagulated with standard dose apixaban.   Palpitations    Peripheral edema    Personal history of other malignant neoplasm of skin 06/01/2016   Pneumonia    Pre-diabetes    Primary osteoarthritis of both knees 06/13/2017   SBO (small bowel obstruction) ([place]) 08/06/2017   Skin cancer of nose    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HCC)    Vitamin D deficiency     Medications:  Medications Prior to Admission  Medication Sig Dispense Refill Last Dose   amLODipine (NORVASC) 5 MG tablet Take 5 mg by mouth daily.   07/21/2022 at 0800   apixaban (ELIQUIS) 5 MG TABS tablet Take 1 tablet (5 mg total) by mouth 2 (two) times daily. 60 tablet 11 07/21/2022 at  0800   aspirin EC 81 MG tablet Take 81 mg by mouth daily. Swallow whole.   07/21/2022 at 0800   atorvastatin (LIPITOR) 80 MG tablet Take 80 mg by mouth at bedtime.   07/21/2022 at 2000   citalopram (CELEXA) 40 MG tablet Take 1 tablet (40 mg total) by mouth daily. 30 tablet 2 07/21/2022 at 0800   COMBIVENT RESPIMAT 20-100 MCG/ACT AERS respimat    prn at prn   esomeprazole (NEXIUM) 20 MG capsule Take 20 mg by mouth daily.   07/21/2022 at 0800   ferrous sulfate 325 (65 FE) MG tablet Take 325 mg by mouth daily.   07/21/2022 at 0800   furosemide (LASIX) 40 MG tablet Take 60 mg by mouth daily.   07/21/2022 at 0800   isosorbide mononitrate (IMDUR) 60 MG 24 hr tablet Take 60 mg by mouth daily.   07/21/2022 at 0800   metoprolol tartrate (LOPRESSOR) 50 MG tablet Take 50 mg by mouth 2 (two) times daily.   07/21/2022 at 2000   Multiple Vitamin (MULTIVITAMIN WITH MINERALS) TABS tablet Take 1 tablet by mouth daily. 30 tablet 0 07/21/2022 at 0800   omeprazole (PRILOSEC) 20 MG capsule Take 20 mg by mouth 2 (two) times daily before a meal.   07/21/2022 at 2000   ondansetron (ZOFRAN) 4 MG tablet Take 4 mg by mouth every 8 (eight) hours as needed for nausea or vomiting.   prn at prn   oxyCODONE-acetaminophen (PERCOCET/ROXICET) 5-325 MG tablet Take 1 tablet by mouth every 6 (six) hours as needed for moderate pain. 40 tablet 0 prn at prn   pregabalin (LYRICA) 100 MG capsule Take 1 capsule (100 mg total) by mouth 3 (three) times daily. 90 capsule 3 07/21/2022 at 2000   feeding supplement (ENSURE ENLIVE / ENSURE PLUS) LIQD Take 237 mLs by mouth 2 (two) times daily between meals. 237 mL 12     Assessment: Pharmacy consulted to dose heparin in this 61 year old female admitted with ischemic leg.   Pt reports taking Eliquis 5 mg PO BID PTA with last dose on 9/29 @ 2000.    Goal of Therapy:  Heparin level 0.5 - 1.0 units/mL per vascular   Monitor platelets by anticoagulation protocol: Yes  Documentation:  Critically elevated  aPTT at 162 with correlating anti-xa level of >  1.10 s/p thrombectomy on 10/1 at 1600. Vascular believes this may be secondary to heparin 8,000 unit bolus given at 1400 during procedure. Heparin infusion rate was increased from 1400 to 1800 units/hr. Instructed by vascular surgeon to continue heparin infusion rate as is. Vascular also requests that we refrain from holding infusion. Goal heparin level increased to 0.5 - 1.0 units/mL. If heparin level supratherapeutic, only decrease rate.  Plan: heparin level supratherapeutic --decrease heparin at 1500 units/hr --recheck heparin level in 8 hr after rate change --daily CBC while on IV heparin  Renda Rolls, PharmD, [facility] 07/25/2022 5:06 AM

## 2022-07-25 NOTE — TOC Initial Note (Signed)
Transition of Care [facility]) - Initial/Assessment Note    Patient Details  Name: Carla [name] MRN: [id number] Date of Birth: [birth year]  Transition of Care [facility]) CM/SW Contact:    Shelbie Hutching, RN Phone Number: 07/25/2022, 10:55 AM  Clinical Narrative:                 Patient admitted to the hospital with critical limb ischemia of left leg.  RNCM met with patient at the bedside.  Patient lives with her daughter [name] in [place] just over the [address] [place] [address].  PT is currently recommending SNF, patient is adamantly refusing to go to SNF.  Daughter reports that patient had a very bad experience at [facility] and never wants to go back.  Encouraged daughter to talk with patient about discharge plans, she does not have to go back to [facility] if they choose SNF.  Patient has a hospital bed, wheelchair, and bedside commode at home.  Patient has a right AKA and at home is able to stand and pivot to transfer.  Daughter says that she can move around the house with her wheelchair and even do some cooking. RNCM will attempt to set up [facility] services if patient cont to refuse SNF.  Expected Discharge Plan: [place] Barriers to Discharge: Continued Medical Work up   Patient Goals and CMS Choice Patient states their goals for this hospitalization and ongoing recovery are:: Patient does not want to go to SNF, wants to go home CMS Medicare.gov Compare Post Acute Care list provided to:: Patient Represenative (must comment) Choice offered to / list presented to : Adult Children, Patient  Expected Discharge Plan and Services Expected Discharge Plan: [place]   Discharge Planning Services: CM Consult   Living arrangements for the past 2 months: Single Family Home                 DME Arranged: N/A DME Agency: NA                  Prior Living Arrangements/Services Living arrangements for the past 2 months: Single Family  Home Lives with:: Adult Children Patient language and need for interpreter reviewed:: Yes Do you feel safe going back to the place where you live?: Yes      Need for Family Participation in Patient Care: Yes (Comment) Care giver support system in place?: Yes (comment) (daughters) Current home services: DME (hospital bed, wheelchair, bedside commode) Criminal Activity/Legal Involvement Pertinent to Current Situation/Hospitalization: No - Comment as needed  Activities of Daily Living Home Assistive Devices/Equipment: Wheelchair ADL Screening (condition at time of admission) Patient's cognitive ability adequate to safely complete daily activities?: Yes Is the patient deaf or have difficulty hearing?: No Does the patient have difficulty seeing, even when wearing glasses/contacts?: No Does the patient have difficulty concentrating, remembering, or making decisions?: No Patient able to express need for assistance with ADLs?: Yes Does the patient have difficulty dressing or bathing?: No Independently performs ADLs?: Yes (appropriate for developmental age) Does the patient have difficulty walking or climbing stairs?: No Weakness of Legs: None Weakness of Arms/Hands: None  Permission Sought/Granted Permission sought to share information with : Case Manager, Family Supports Permission granted to share information with : Yes, Verbal Permission Granted  Share Information with NAME: Cierrah Dace     Permission granted to share info w Relationship: daughter  Permission granted to share info w Contact Information:  [phone]  Emotional Assessment Appearance:: Appears older than stated age Attitude/Demeanor/Rapport: Engaged Affect (typically observed): Accepting Orientation: : Oriented to Self, Oriented to Place, Oriented to  Time, Oriented to Situation Alcohol / Substance Use: Not Applicable Psych Involvement: No (comment)  Admission diagnosis:  PAD (peripheral artery disease) ([place])  [I73.9] Ischemia of extremity [I99.8] Critical limb ischemia of left lower extremity (HCC) [I70.222] Patient Active Problem List   Diagnosis Date Noted   Abscess of knee, right 07/24/2022   Wound infection 05/02/2022   Acute respiratory failure with hypoxia ([place]) 05/02/2022   Lactic acidosis 05/02/2022   Hypoalbuminemia due to protein-calorie malnutrition ([place]) 05/02/2022   Atherosclerosis of artery of extremity with rest pain (HCC) 04/06/2022   Rectal bleeding 03/21/2022   Hypokalemia 02/03/2022   Chronic anticoagulation    Anxiety and depression    Palpitations    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[place]) 12/03/2021   Critical limb ischemia of left lower extremity ([place]) 11/30/2021   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[place]) 11/11/2021   Arterial occlusion 11/11/2021   CAD (coronary artery disease) 07/07/2021   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[place]) 06/27/2021   Ischemic pain of right foot 06/27/2021   PAD (peripheral artery disease) ([place]) 05/03/2021   Ischemia of extremity 01/24/2021   Tobacco abuse counseling 10/26/2020   Smoker 10/26/2020   Chronic diastolic CHF (congestive heart failure) ([place]) 10/06/2020   Neuropathic pain 08/19/2020   Bursitis of right shoulder 12/01/2019   Chronic pain 12/01/2019   Primary osteoarthritis involving multiple joints 12/01/2019   Ischemia of left lower extremity 11/17/2019   Carpal tunnel syndrome, right 11/10/2019   Health care maintenance 03/26/2019   Disorder of skeletal system 03/26/2019   Problems influencing health status 03/26/2019   Chronic low back pain (Secondary area of Pain) (Bilateral) w/o sciatica 03/26/2019   Rib pain on left side 03/25/2019   Aortic atherosclerosis ([place]) 09/12/2018   COPD (chronic obstructive pulmonary disease) ([place]) 09/12/2018   Moderate episode of recurrent major depressive disorder ([place]) 09/12/2018   Lumbar spondylosis 09/06/2018   History of total knee replacement (Right) 07/11/2018   Ventral hernia with obstruction and  without gangrene 07/02/2018   PAF (paroxysmal atrial fibrillation) ([place]) 06/15/2018   Accelerated hypertension 04/08/2018   Acute encephalopathy 04/08/2018   DDD (degenerative disc disease), lumbosacral 09/25/2017   Osteoarthritis of knee 09/25/2017   Adjustment disorder with anxiety 08/15/2017   Chronic idiopathic constipation    Protein-calorie malnutrition, severe 08/08/2017   SBO (small bowel obstruction) ([place]) 08/06/2017   Chronic mesenteric ischemia ([place])    AKI (acute kidney injury) ([place]) 07/29/2017   Hypotension 07/29/2017   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[place])    Abdominal pain    Occlusive mesenteric ischemia ([place]) 06/19/2017   Acute focal ischemia of small intestine (HCC)    Acute right-sided low back pain with right-sided sciatica 06/13/2017   Osteoarthritis of knee (Bilateral) 06/13/2017   Chronic pain syndrome 09/13/2016   Morbid obesity with BMI of 40.0-44.9, adult ([place]) 07/16/2016   Personal history of other malignant neoplasm of skin 06/01/2016   Chronic knee pain (Primary Area of Pain) (Bilateral) 01/19/2016   Essential hypertension 01/19/2016   GERD (gastroesophageal reflux disease) 01/19/2016   Mixed hyperlipidemia 01/19/2016   Prediabetes 01/19/2016   Recurrent major depressive disorder ([place]) 01/19/2016   Urinary tract infection 01/09/2014   Chronic pelvic pain in female 11/14/2013   Mixed stress and urge urinary incontinence 11/14/2013   Chest pain 08/06/2012   PCP:  [facility], [place]:   [place]  [place] [address] [place] [address] [place] [address] Phone: [phone] Fax: [phone]     Social Determinants of Health (SDOH) Interventions    Readmission Risk Interventions    07/25/2022   10:49 AM 05/05/2022   10:52 AM 03/24/2022    9:51 AM  Readmission Risk Prevention Plan  Transportation Screening Complete Complete Complete  PCP or Specialist Appt  within 3-5 Days   Complete  HRI or [place]   Complete  Social Work Consult for [place] Planning/Counseling   Complete  Palliative Care Screening   Not Applicable  Medication Review Press photographer) Complete Complete Complete  PCP or Specialist appointment within 3-5 days of discharge Complete Complete   HRI or Home Care Consult Complete    SW Recovery Care/Counseling Consult Not Complete Complete   SW Consult Not Complete Comments NA    Palliative Care Screening  Not [place] Patient Refused Patient Refused

## 2022-07-26 ENCOUNTER — Inpatient Hospital Stay: Payer: Medicare Other

## 2022-07-26 DIAGNOSIS — I70222 Atherosclerosis of native arteries of extremities with rest pain, left leg: Secondary | ICD-10-CM | POA: Diagnosis not present

## 2022-07-26 DIAGNOSIS — T148XXA Other injury of unspecified body region, initial encounter: Secondary | ICD-10-CM

## 2022-07-26 LAB — GLUCOSE, CAPILLARY
Glucose-Capillary: 123 mg/dL — ABNORMAL HIGH (ref 70–99)
Glucose-Capillary: 124 mg/dL — ABNORMAL HIGH (ref 70–99)
Glucose-Capillary: 126 mg/dL — ABNORMAL HIGH (ref 70–99)
Glucose-Capillary: 127 mg/dL — ABNORMAL HIGH (ref 70–99)
Glucose-Capillary: 139 mg/dL — ABNORMAL HIGH (ref 70–99)
Glucose-Capillary: 145 mg/dL — ABNORMAL HIGH (ref 70–99)
Glucose-Capillary: 154 mg/dL — ABNORMAL HIGH (ref 70–99)

## 2022-07-26 LAB — BASIC METABOLIC PANEL
Anion gap: 5 (ref 5–15)
BUN: 31 mg/dL — ABNORMAL HIGH (ref 8–23)
CO2: 20 mmol/L — ABNORMAL LOW (ref 22–32)
Calcium: 7.7 mg/dL — ABNORMAL LOW (ref 8.9–10.3)
Chloride: 110 mmol/L (ref 98–111)
Creatinine, Ser: 1.03 mg/dL — ABNORMAL HIGH (ref 0.44–1.00)
GFR, Estimated: >60 mL/min (ref >60)
Glucose, Bld: 134 mg/dL — ABNORMAL HIGH (ref 70–99)
Potassium: 4.5 mmol/L (ref 3.5–5.1)
Sodium: 135 mmol/L (ref 135–145)

## 2022-07-26 LAB — CBC
HCT: 26.7 % — ABNORMAL LOW (ref 36.0–46.0)
Hemoglobin: 8.3 g/dL — ABNORMAL LOW (ref 12.0–15.0)
MCH: 27.7 pg (ref 26.0–34.0)
MCHC: 31.1 g/dL (ref 30.0–36.0)
MCV: 89 fL (ref 80.0–100.0)
Platelets: 158 10*3/uL (ref 150–400)
RBC: 3 MIL/uL — ABNORMAL LOW (ref 3.87–5.11)
RDW: 16.8 % — ABNORMAL HIGH (ref 11.5–15.5)
WBC: 24.5 10*3/uL — ABNORMAL HIGH (ref 4.0–10.5)
nRBC: 0.3 % — ABNORMAL HIGH (ref 0.0–0.2)

## 2022-07-26 LAB — HEPARIN LEVEL (UNFRACTIONATED)
Heparin Unfractionated: 0.7 IU/mL (ref 0.30–0.70)
Heparin Unfractionated: 0.63 IU/mL (ref 0.30–0.70)

## 2022-07-26 MED ORDER — LORAZEPAM 2 MG/ML IJ SOLN
1.0000 mg | INTRAMUSCULAR | Status: DC | PRN
  Administered 2022-07-26 (×2): 1 mg via INTRAVENOUS
  Filled 2022-07-26 (×2): qty 1

## 2022-07-26 MED ORDER — ORAL CARE MOUTH RINSE: 15.0000 mL | OROMUCOSAL | Status: DC | PRN

## 2022-07-26 MED ORDER — FENTANYL CITRATE PF 50 MCG/ML IJ SOSY
50.0000 ug | PREFILLED_SYRINGE | INTRAMUSCULAR | Status: DC | PRN
  Administered 2022-07-26: 50 ug via INTRAVENOUS
  Filled 2022-07-26: qty 1

## 2022-07-26 MED ORDER — HYDROMORPHONE HCL 1 MG/ML IJ SOLN
1.0000 mg | INTRAMUSCULAR | Status: DC | PRN
  Administered 2022-07-26 – 2022-08-01 (×33): 1 mg via INTRAVENOUS
  Filled 2022-07-26 (×33): qty 1

## 2022-07-26 MED ORDER — HYDROCODONE-ACETAMINOPHEN 5-325 MG PO TABS
1.0000 | ORAL_TABLET | ORAL | Status: DC | PRN
  Administered 2022-07-27 – 2022-08-01 (×15): 2 via ORAL
  Filled 2022-07-26 (×15): qty 2

## 2022-07-26 NOTE — Progress Notes (Signed)
PT Cancellation Note  Patient Details Name: Carla [name] MRN: [id number] DOB: [birth year]   Cancelled Treatment:    Reason Eval/Treat Not Completed: Pain limiting ability to participate;Fatigue/lethargy limiting ability to participate (Nurse reports patient has had pain control issues despite pain medication. PT will hold and follow up as appropriate.)  Minna Merritts, PT, MPT  Percell Locus 07/26/2022, 2:25 PM

## 2022-07-26 NOTE — Progress Notes (Signed)
       CROSS COVER NOTE  NAME: Carla [name] MRN: [id number] DOB : 1960/11/24    Date of Service   07/26/2022   HPI/Events of Note   Notified by nursing staff of (L) arm swelling and pain. Arm is warm with palpable pulses. Carla [name] recently underwent (L) axillary to common femoral bypass with vascular surgery. Nursing staff also reached out to Vascular surgery with assessment findings.  Interventions   Assessment/Plan: LUE US DVT     This document was prepared using Dragon voice recognition software and may include unintentional dictation errors.  Carla Glass DNP, MHA, FNP-BC Nurse Practitioner [facility] Pager [phone]

## 2022-07-26 NOTE — Progress Notes (Signed)
ANTICOAGULATION CONSULT NOTE  Pharmacy Consult for Heparin infusion Indication: DVT/Ischemic leg   Allergies  Allergen Reactions   Gabapentin     Tremors    Bactrim [Sulfamethoxazole-Trimethoprim] Itching    Patient Measurements: Height: '5\' 2"'$  (157.5 cm) Weight: 80.7 kg (178 lb) IBW/kg (Calculated) : 50.1 Heparin Dosing Weight: 68.1 kg   Vital Signs: Temp: 99.2 F (37.3 C) (10/04 0200) Temp Source: Oral (10/04 0200) BP: 125/67 (10/04 0400) Pulse Rate: 91 (10/04 0500)  Labs: Recent Labs    07/23/22 1719 07/23/22 2210 07/24/22 0445 07/24/22 1108 07/25/22 0354 07/25/22 0355 07/25/22 1309 07/25/22 2207 07/26/22 0411  HGB 10.6*  --  11.8*  --   --  12.0  --   --  8.3*  HCT 34.8*  --  37.8  --   --  37.8  --   --  26.7*  PLT 167  --  203  --   --  202  --   --  158  APTT  --   --  93*  --   --  162* 127*  --   --   HEPARINUNFRC  --    < > 0.90*   < >  --  1.08* 1.02* 0.93* 0.70  CREATININE 0.86  --  0.80  --  1.29*  --   --   --   --    < > = values in this interval not displayed.     Estimated Creatinine Clearance: 44.5 mL/min (A) (by C-G formula based on SCr of 1.29 mg/dL (H)).   Medical History: Past Medical History:  Diagnosis Date   Abdominal aortic atherosclerosis ([place])    a. 05/2017 CTA abd/pelvis: significant atherosclerotic dzs of the infrarenal abd Ao w/ some mural thrombus. No aneurysm or dissection.   Acute focal ischemia of small intestine (HCC)    Acute right-sided low back pain with right-sided sciatica 06/13/2017   AKI (acute kidney injury) ([place]) 07/29/2017   Anemia    Anginal pain ([place])    a. 08/2012 Lexiscan MV: EF 54%, non ischemia/infarct.   Anxiety and depression    Baker's cyst of knee, right    a. 07/2016 U/S: 4.1 x 1.4 x 2.9 cystic structure in R poplitetal fossa.   Bell's palsy    CAD (coronary artery disease)    CHF (congestive heart failure) ([place])    a.) TTE 06/20/2017: EF 60-65%, no rwma, G1DD, no source of cardiac emboli.    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 05/2017 CTA Abd/pelvis: apparent thrombus or embolus in prox SMA (70-90%); b. 05/2017 catheter directed tPA, mechanical thrombectomy, and stenting of the SMA.   Essential hypertension with goal blood pressure less than 140/90 01/19/2016   Gastroesophageal reflux disease 01/19/2016   GERD (gastroesophageal reflux disease)    H/O colectomy    History of 2019 novel coronavirus disease (COVID-19) 11/14/2019   History of kidney stones    Hyperlipidemia    Hypertension    Morbid obesity with BMI of 40.0-44.9, adult ([place])    NSTEMI (non-ST elevated myocardial infarction) ([place]) 06/27/2021   a.) high sensitivity troponins trended: 893 --> 1587 --> 1748 --> 868 --> 735 ng/L   Occlusive mesenteric ischemia ([place]) 06/19/2017   Osteoarthritis  PAD (peripheral artery disease) (HCC)    PAF (paroxysmal atrial fibrillation) (HCC)    a.) CHA2DS2-VASc = 6 (sex, CHF, HTN, DVT x2, aortic plaque). b.) rate/rhythm maintained on oral metoprolol tartrate; chronically anticoagulated with standard dose apixaban.   Palpitations    Peripheral edema    Personal history of other malignant neoplasm of skin 06/01/2016   Pneumonia    Pre-diabetes    Primary osteoarthritis of both knees 06/13/2017   SBO (small bowel obstruction) ([place]) 08/06/2017   Skin cancer of nose    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HCC)    Vitamin D deficiency     Medications:  Medications Prior to Admission  Medication Sig Dispense Refill Last Dose   amLODipine (NORVASC) 5 MG tablet Take 5 mg by mouth daily.   07/21/2022 at 0800   apixaban (ELIQUIS) 5 MG TABS tablet Take 1 tablet (5 mg total) by mouth 2 (two) times daily. 60 tablet 11 07/21/2022 at 0800   aspirin EC 81 MG tablet Take 81 mg by  mouth daily. Swallow whole.   07/21/2022 at 0800   atorvastatin (LIPITOR) 80 MG tablet Take 80 mg by mouth at bedtime.   07/21/2022 at 2000   citalopram (CELEXA) 40 MG tablet Take 1 tablet (40 mg total) by mouth daily. 30 tablet 2 07/21/2022 at 0800   COMBIVENT RESPIMAT 20-100 MCG/ACT AERS respimat    prn at prn   esomeprazole (NEXIUM) 20 MG capsule Take 20 mg by mouth daily.   07/21/2022 at 0800   ferrous sulfate 325 (65 FE) MG tablet Take 325 mg by mouth daily.   07/21/2022 at 0800   furosemide (LASIX) 40 MG tablet Take 60 mg by mouth daily.   07/21/2022 at 0800   isosorbide mononitrate (IMDUR) 60 MG 24 hr tablet Take 60 mg by mouth daily.   07/21/2022 at 0800   metoprolol tartrate (LOPRESSOR) 50 MG tablet Take 50 mg by mouth 2 (two) times daily.   07/21/2022 at 2000   Multiple Vitamin (MULTIVITAMIN WITH MINERALS) TABS tablet Take 1 tablet by mouth daily. 30 tablet 0 07/21/2022 at 0800   omeprazole (PRILOSEC) 20 MG capsule Take 20 mg by mouth 2 (two) times daily before a meal.   07/21/2022 at 2000   ondansetron (ZOFRAN) 4 MG tablet Take 4 mg by mouth every 8 (eight) hours as needed for nausea or vomiting.   prn at prn   oxyCODONE-acetaminophen (PERCOCET/ROXICET) 5-325 MG tablet Take 1 tablet by mouth every 6 (six) hours as needed for moderate pain. 40 tablet 0 prn at prn   pregabalin (LYRICA) 100 MG capsule Take 1 capsule (100 mg total) by mouth 3 (three) times daily. 90 capsule 3 07/21/2022 at 2000   feeding supplement (ENSURE ENLIVE / ENSURE PLUS) LIQD Take 237 mLs by mouth 2 (two) times daily between meals. 237 mL 12     Assessment: Pharmacy consulted to dose heparin in this 61 year old female admitted with ischemic leg.   Pt reports taking Eliquis 5 mg PO BID PTA with last dose on 9/29 @ 2000.    Goal of Therapy:  Heparin level 0.5 - 1.0 units/mL per vascular   Monitor platelets by anticoagulation protocol: Yes  Documentation:  Critically elevated aPTT at 162 with correlating anti-xa level of  >1.10 s/p thrombectomy on 10/1 at 1600. Vascular believes this may be secondary to heparin 8,000 unit bolus given at 1400 during procedure. Heparin infusion rate was increased from 1400 to 1800 units/hr. Instructed by vascular surgeon  to continue heparin infusion rate as is. Vascular also requests that we refrain from holding infusion. Goal heparin level increased to 0.5 - 1.0 units/mL. If heparin level supratherapeutic, only decrease rate.  Plan: heparin level therapeutic x 2, but dropped 0.93 >> 0.7 in ~ 6 hrs --Continue heparin rate to 1400 units/hr --Recheck HL in 12 hr to confirm HL remains within therapeutic range. --daily CBC while on IV heparin  Renda Rolls, PharmD, [facility] 07/26/2022 5:43 AM

## 2022-07-26 NOTE — Progress Notes (Signed)
OT Cancellation Note  Patient Details Name: Carla [name] MRN: [id number] DOB: 1961-07-20   Cancelled Treatment:    Reason Eval/Treat Not Completed: Other (comment);Patient not medically ready (per RN, deferred on this date. Pain not managed by multiple meds, requests to defer at this time. OT will reattempt as able.Shanon Payor, OTD OTR/L  07/26/22, 3:41 PM

## 2022-07-26 NOTE — Progress Notes (Signed)
ANTICOAGULATION CONSULT NOTE  Pharmacy Consult for Heparin infusion Indication: DVT/Ischemic leg   Allergies  Allergen Reactions   Gabapentin     Tremors    Bactrim [Sulfamethoxazole-Trimethoprim] Itching    Patient Measurements: Height: '5\' 2"'$  (157.5 cm) Weight: 80.7 kg (178 lb) IBW/kg (Calculated) : 50.1 Heparin Dosing Weight: 68.1 kg   Vital Signs: Temp: 99.1 F (37.3 C) (10/04 0800) Temp Source: Oral (10/04 0800) BP: 126/67 (10/04 1000) Pulse Rate: 90 (10/04 1000)  Labs: Recent Labs    07/24/22 0445 07/24/22 1108 07/25/22 0354 07/25/22 0355 07/25/22 1309 07/25/22 2207 07/26/22 0411  HGB 11.8*  --   --  12.0  --   --  8.3*  HCT 37.8  --   --  37.8  --   --  26.7*  PLT 203  --   --  202  --   --  158  APTT 93*  --   --  162* 127*  --   --   HEPARINUNFRC 0.90*   < >  --  1.08* 1.02* 0.93* 0.70  CREATININE 0.80  --  1.29*  --   --   --  1.03*   < > = values in this interval not displayed.     Estimated Creatinine Clearance: 55.7 mL/min (A) (by C-G formula based on SCr of 1.03 mg/dL (H)).   Medical History: Past Medical History:  Diagnosis Date   Abdominal aortic atherosclerosis ([place])    a. 05/2017 CTA abd/pelvis: significant atherosclerotic dzs of the infrarenal abd Ao w/ some mural thrombus. No aneurysm or dissection.   Acute focal ischemia of small intestine (HCC)    Acute right-sided low back pain with right-sided sciatica 06/13/2017   AKI (acute kidney injury) ([place]) 07/29/2017   Anemia    Anginal pain ([place])    a. 08/2012 Lexiscan MV: EF 54%, non ischemia/infarct.   Anxiety and depression    Baker's cyst of knee, right    a. 07/2016 U/S: 4.1 x 1.4 x 2.9 cystic structure in R poplitetal fossa.   Bell's palsy    CAD (coronary artery disease)    CHF (congestive heart failure) ([place])    a.) TTE 06/20/2017: EF 60-65%, no rwma, G1DD, no source of cardiac emboli.   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 05/2017 CTA Abd/pelvis: apparent thrombus or embolus in prox SMA (70-90%); b. 05/2017 catheter directed tPA, mechanical thrombectomy, and stenting of the SMA.   Essential hypertension with goal blood pressure less than 140/90 01/19/2016   Gastroesophageal reflux disease 01/19/2016   GERD (gastroesophageal reflux disease)    H/O colectomy    History of 2019 novel coronavirus disease (COVID-19) 11/14/2019   History of kidney stones    Hyperlipidemia    Hypertension    Morbid obesity with BMI of 40.0-44.9, adult ([place])    NSTEMI (non-ST elevated myocardial infarction) ([place]) 06/27/2021   a.) high sensitivity troponins trended: 893 --> 1587 --> 1748 --> 868 --> 735 ng/L   Occlusive mesenteric ischemia ([place]) 06/19/2017   Osteoarthritis    PAD (peripheral artery disease) (HCC)    PAF (paroxysmal atrial fibrillation) (HCC)    a.) CHA2DS2-VASc = 6 (sex, CHF, HTN, DVT x2, aortic plaque). b.) rate/rhythm maintained on oral metoprolol tartrate;  chronically anticoagulated with standard dose apixaban.   Palpitations    Peripheral edema    Personal history of other malignant neoplasm of skin 06/01/2016   Pneumonia    Pre-diabetes    Primary osteoarthritis of both knees 06/13/2017   SBO (small bowel obstruction) ([place]) 08/06/2017   Skin cancer of nose    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HCC)    Vitamin D deficiency     Medications:  Medications Prior to Admission  Medication Sig Dispense Refill Last Dose   amLODipine (NORVASC) 5 MG tablet Take 5 mg by mouth daily.   07/21/2022 at 0800   apixaban (ELIQUIS) 5 MG TABS tablet Take 1 tablet (5 mg total) by mouth 2 (two) times daily. 60 tablet 11 07/21/2022 at 0800   aspirin EC 81 MG tablet Take 81 mg by mouth daily. Swallow whole.   07/21/2022 at 0800   atorvastatin (LIPITOR) 80 MG tablet  Take 80 mg by mouth at bedtime.   07/21/2022 at 2000   citalopram (CELEXA) 40 MG tablet Take 1 tablet (40 mg total) by mouth daily. 30 tablet 2 07/21/2022 at 0800   COMBIVENT RESPIMAT 20-100 MCG/ACT AERS respimat    prn at prn   esomeprazole (NEXIUM) 20 MG capsule Take 20 mg by mouth daily.   07/21/2022 at 0800   ferrous sulfate 325 (65 FE) MG tablet Take 325 mg by mouth daily.   07/21/2022 at 0800   furosemide (LASIX) 40 MG tablet Take 60 mg by mouth daily.   07/21/2022 at 0800   isosorbide mononitrate (IMDUR) 60 MG 24 hr tablet Take 60 mg by mouth daily.   07/21/2022 at 0800   metoprolol tartrate (LOPRESSOR) 50 MG tablet Take 50 mg by mouth 2 (two) times daily.   07/21/2022 at 2000   Multiple Vitamin (MULTIVITAMIN WITH MINERALS) TABS tablet Take 1 tablet by mouth daily. 30 tablet 0 07/21/2022 at 0800   omeprazole (PRILOSEC) 20 MG capsule Take 20 mg by mouth 2 (two) times daily before a meal.   07/21/2022 at 2000   ondansetron (ZOFRAN) 4 MG tablet Take 4 mg by mouth every 8 (eight) hours as needed for nausea or vomiting.   prn at prn   oxyCODONE-acetaminophen (PERCOCET/ROXICET) 5-325 MG tablet Take 1 tablet by mouth every 6 (six) hours as needed for moderate pain. 40 tablet 0 prn at prn   pregabalin (LYRICA) 100 MG capsule Take 1 capsule (100 mg total) by mouth 3 (three) times daily. 90 capsule 3 07/21/2022 at 2000   feeding supplement (ENSURE ENLIVE / ENSURE PLUS) LIQD Take 237 mLs by mouth 2 (two) times daily between meals. 237 mL 12     Assessment: Pharmacy consulted to dose heparin in this 61 year old female admitted with ischemic leg.   Pt reports taking Eliquis 5 mg PO BID PTA with last dose on 9/29 @ 2000.    Goal of Therapy:  Heparin level 0.5 - 1.0 units/mL per vascular   Monitor platelets by anticoagulation protocol: Yes  Documentation:  Critically elevated aPTT at 162 with correlating anti-xa level of >1.10 s/p thrombectomy on 10/1 at 1600. Vascular believes this may be secondary to heparin  8,000 unit bolus given at 1400 during procedure. Heparin infusion rate was increased from 1400 to 1800 units/hr. Instructed by vascular surgeon to continue heparin infusion rate as is. Vascular also requests that we refrain from holding infusion. Goal heparin level increased to 0.5 - 1.0 units/mL. If heparin level supratherapeutic, only decrease rate.  Plan:  heparin level therapeutic x 3 --Continue heparin rate to 1400 units/hr --Recheck next heparin level in am  --daily CBC while on IV heparin  Vallery Sa, PharmD, BCPS 07/26/2022 12:21 PM

## 2022-07-26 NOTE — Progress Notes (Signed)
[facility]  Daily Progress Note   Subjective  -   Left upper extremity pain and swelling.  DVT study ordered for today.  No complaints of pain or discomfort in her left lower extremity.  Objective Vitals:   07/26/22 1000 07/26/22 1200 07/26/22 1400 07/26/22 2000  BP: 126/67 (!) 129/58 (!) 104/58 126/81  Pulse: 90 88 91 99  Resp: '12 13 11 11  '$ Temp:  98.9 F (37.2 C)  99.3 F (37.4 C)  TempSrc:  Oral  Oral  SpO2: 98% 98% 96% 97%  Weight:      Height:        Intake/Output Summary (Last 24 hours) at 07/26/2022 2159 Last data filed at 07/26/2022 2000 Gross per 24 hour  Intake 3290.75 ml  Output 1230 ml  Net 2060.75 ml    PULM  CTAB CV  RRR VASC  left foot warm  Laboratory CBC    Component Value Date/Time   WBC 24.5 (H) 07/26/2022 0411   HGB 8.3 (L) 07/26/2022 0411   HGB 13.4 02/12/2015 1137   HCT 26.7 (L) 07/26/2022 0411   HCT 40.9 02/12/2015 1137   PLT 158 07/26/2022 0411   PLT 272 02/12/2015 1137    BMET    Component Value Date/Time   NA 135 07/26/2022 0411   NA 140 02/12/2015 1137   K 4.5 07/26/2022 0411   K 2.9 (L) 02/12/2015 1137   CL 110 07/26/2022 0411   CL 107 02/12/2015 1137   CO2 20 (L) 07/26/2022 0411   CO2 27 02/12/2015 1137   GLUCOSE 134 (H) 07/26/2022 0411   GLUCOSE 120 (H) 02/12/2015 1137   BUN 31 (H) 07/26/2022 0411   BUN 15 02/12/2015 1137   CREATININE 1.03 (H) 07/26/2022 0411   CREATININE 0.78 02/12/2015 1137   CALCIUM 7.7 (L) 07/26/2022 0411   CALCIUM 9.0 02/12/2015 1137   GFRNONAA >60 07/26/2022 0411   GFRNONAA >60 02/12/2015 1137   GFRAA >60 11/23/2019 1330   GFRAA >60 02/12/2015 1137    Assessment/Planning: POD #2 s/p left axillary to common femoral bypass  Left upper extremity DVT study found 6.6 x 6.3 x 6.5 cm (volume = 140 cm^3) hematoma under the left axillary surgical site.  No evidence of DVT.  Currently no plans for evacuation.  We will allow this to resolve naturally. CT scan showed concern  for possible small abscess in her right above-knee amputation residual limb.  This is small and may be a consequence of her wound recently completely healing.  We recommend treating conservatively with antibiotics we will continue to follow-up outpatient. Patient remains on heparin drip, at this time patient can transition to Eliquis.  Given that the patient has previously had a thrombotic event would recommend transitioning with loading dose of 10 mg twice daily before transitioning to 5 mg.  Kris Hartmann  07/26/2022, 9:59 PM

## 2022-07-26 NOTE — Progress Notes (Addendum)
PROGRESS NOTE    Carla [name]   WCB:[id number] DOB: 09-Jun-1961  DOA: 07/22/2022 Date of Service: 07/26/22 PCP: [facility] [place]     Brief Narrative / Hospital Course:  Carla [name] is a 61 y.o. female with medical history significant for peripheral arterial disease s/p right AKA, chronic diastolic dysfunction CHF, anxiety and depression, COPD, paroxysmal A-fib on chronic anticoagulation therapy, who presents to the ED with left leg pain for the past 1 week which had been progressively worsening.  09/30: CTA found with ischemic LLE. The patient was evaluated by vascular surgeon, Dr. Trula Slade in the ED who will take her to the OR on Sunday 10/1.  Patient started on heparin. Admitted to hospitalist service.  10/01: pt went to OR, s/p vascular procedure, post OR woke up c/o leg pain, mottled leg, went back to OR s/p thrombectomy of Left axillary femoral bypass graft with revision. Transferred to ICU.  10/02: Started on cefepime for concern for abscess of right BKA stump 10/03: Left upper extremity swelling noted overnight, ultrasound pending as of this AM --> no DVT but (+)hematoma. D/w vascular - no procedures planned for this (expected w/ anticoagulation) and ok to transfer to floor. Pain control has been a challenge but she is better w/ anxiolytics.     Consultants:  Vascular Surgery   Procedures: 10/01: Left axillary to common femoral artery bypass graft to treat ischemia 10/01: Thrombectomy of left axillary femoral bypass graft with revision  Antimicrobials:  Cefazolin x3 doses  Cefepime 10/2    ASSESSMENT & PLAN:   Principal Problem:   Critical limb ischemia of left lower extremity (HCC) Active Problems:   AKI (acute kidney injury) ([place])   PAF (paroxysmal atrial fibrillation) (HCC)   Chronic diastolic CHF (congestive heart failure) (HCC)   Abscess of knee, right   Critical limb ischemia of left lower extremity (HCC) Peripheral artery disease s/p right  BKA S/p OR initially 10/01 for Left axillary to common femoral artery bypass graft, Prevena wound VAC placed .  Post surgery patient had left foot pain and mottled leg was taken to the OR again 10/01, thrombectomy of left axillofemoral bypass graft Continue on heparin per vascular surgery, home eliquis held (Hx Afib) Continuing aspirin, statin Pain control  Left upper extremity swelling Hematoma Noted overnight 10/03-10/04 Pending ultrasound as of this AM - no DVT but there is large (6.6 x 6.3 x 6.5 cm (volume = 140 cm^3)) hematoma under the left axillary surgical site. D/w vascular - no procedures planned for this (expected w/ anticoagulation) and ok to transfer to floor  Rt knee fluid collection concerning for abscess At site of amputation, found on CT concern for abscess Started on Cefepime Vascular following, may need incision/drainage before the procedure.   AKI Prerenal Continue on ivf Monitor  Avoid nephrotoxic meds  PAF (paroxysmal atrial fibrillation) (HCC) Eliquis on hold for washout Continue metoprolol for rate control  Chronic diastolic CHF (congestive heart failure) (HCC) Clinically compensated Continue metoprolol and Imdur    DVT prophylaxis: n/a on heparin as above  Pertinent IV fluids/nutrition: Lactated Ringer's 100 mL/h continuous - stopped today, full liquid diet to advance as tolerate  Central lines / invasive devices: Urethral catheter placed 07/23/2022,   Code Status: FULL CODE Family Communication: daughter at bedside on rounds   Disposition: remains inpatient  TOC needs: will need SNF Barriers to discharge / significant pending items: vascular clearance, IV antibiotics for now treating abscess which may need I&D  Subjective:  Patient reports significant pain, dilaudid not helping, po meds not helping. Per RN was requesting PCA which she has had before. NO chest pain or SOB.        Objective:  Vitals:   07/26/22 0800  07/26/22 1000 07/26/22 1200 07/26/22 1400  BP: 131/64 126/67 (!) 129/58 (!) 104/58  Pulse: 91 90 88 91  Resp: '12 12 13 11  '$ Temp: 99.1 F (37.3 C)  98.9 F (37.2 C)   TempSrc: Oral  Oral   SpO2: 98% 98% 98% 96%  Weight:      Height:        Intake/Output Summary (Last 24 hours) at 07/26/2022 1546 Last data filed at 07/26/2022 1300 Gross per 24 hour  Intake 5349.83 ml  Output 750 ml  Net 4599.83 ml   Filed Weights   07/22/22 0029  Weight: 80.7 kg    Examination:  Constitutional:  VS as above General Appearance: alert, obese, ill appearing but NAD Respiratory: Normal respiratory effort No wheeze No rhonchi No rales Cardiovascular: S1/S2 normal No murmur No rub/gallop auscultated Gastrointestinal: No tenderness Musculoskeletal:  No clubbing/cyanosis of digits Symmetrical movement in all extremities R BKA Neurological: No cranial nerve deficit on limited exam Alert Psychiatric: Normal judgment/insight Depressed/anxious mood and affect       Scheduled Medications:   aspirin EC  81 mg Oral Q0600   atorvastatin  80 mg Oral QHS   Chlorhexidine Gluconate Cloth  6 each Topical Daily   citalopram  40 mg Oral Daily   docusate sodium  100 mg Oral Daily   insulin aspart  0-15 Units Subcutaneous Q4H   isosorbide mononitrate  60 mg Oral Daily   mupirocin ointment  1 Application Nasal BID   pantoprazole  40 mg Oral Daily   pregabalin  100 mg Oral TID    Continuous Infusions:  sodium chloride     ceFEPime (MAXIPIME) IV Stopped (07/26/22 1033)   heparin 1,400 Units/hr (07/26/22 1308)   magnesium sulfate bolus IVPB     sodium chloride Stopped (07/25/22 2101)    PRN Medications:  sodium chloride, acetaminophen **OR** acetaminophen, acetaminophen **OR** acetaminophen, alum & mag hydroxide-simeth, guaiFENesin-dextromethorphan, hydrALAZINE, HYDROcodone-acetaminophen, HYDROmorphone (DILAUDID) injection, labetalol, LORazepam, magnesium sulfate bolus IVPB,  methocarbamol, metoprolol tartrate, ondansetron **OR** ondansetron (ZOFRAN) IV, ondansetron, mouth rinse, oxyCODONE, phenol, polyethylene glycol, potassium chloride  Antimicrobials:  Anti-infectives (From admission, onward)    Start     Dose/Rate Route Frequency Ordered Stop   07/25/22 2200  ceFEPIme (MAXIPIME) 2 g in sodium chloride 0.9 % 100 mL IVPB        2 g 200 mL/hr over 30 Minutes Intravenous Every 12 hours 07/25/22 0730     07/24/22 1200  ceFEPIme (MAXIPIME) 2 g in sodium chloride 0.9 % 100 mL IVPB  Status:  Discontinued        2 g 200 mL/hr over 30 Minutes Intravenous Every 8 hours 07/24/22 0957 07/25/22 0730   07/23/22 1630  ceFAZolin (ANCEF) IVPB 2g/100 mL premix        2 g 200 mL/hr over 30 Minutes Intravenous Every 8 hours 07/23/22 1227 07/23/22 2357       Data Reviewed: I have personally reviewed following labs and imaging studies  CBC: Recent Labs  Lab 07/22/22 0030 07/23/22 0030 07/23/22 1719 07/24/22 0445 07/25/22 0355 07/26/22 0411  WBC 8.3 6.3 12.2* 19.0* 21.0* 24.5*  NEUTROABS 4.8  --   --   --   --   --   HGB 11.9*  10.3* 10.6* 11.8* 12.0 8.3*  HCT 38.1 33.7* 34.8* 37.8 37.8 26.7*  MCV 88.4 90.6 89.7 86.7 87.1 89.0  PLT 237 197 167 203 202 546   Basic Metabolic Panel: Recent Labs  Lab 07/22/22 0030 07/23/22 1719 07/24/22 0445 07/25/22 0354 07/26/22 0411  NA 143 137 138 138 135  K 3.9 4.5 4.3 4.4 4.5  CL 108 109 110 111 110  CO2 25 20* 20* 21* 20*  GLUCOSE 102* 230* 158* 172* 134*  BUN '12 11 13 '$ 25* 31*  CREATININE 0.81 0.86 0.80 1.29* 1.03*  CALCIUM 8.9 8.4* 8.3* 7.9* 7.7*   GFR: Estimated Creatinine Clearance: 55.7 mL/min (A) (by C-G formula based on SCr of 1.03 mg/dL (H)). Liver Function Tests: Recent Labs  Lab 07/22/22 0030  AST 13*  ALT 10  ALKPHOS 90  BILITOT 0.5  PROT 6.7  ALBUMIN 3.1*   No results for input(s): "LIPASE", "AMYLASE" in the last 168 hours. No results for input(s): "AMMONIA" in the last 168 hours. Coagulation  Profile: Recent Labs  Lab 07/22/22 0030  INR 0.9   Cardiac Enzymes: No results for input(s): "CKTOTAL", "CKMB", "CKMBINDEX", "TROPONINI" in the last 168 hours. BNP (last 3 results) No results for input(s): "PROBNP" in the last 8760 hours. HbA1C: Recent Labs    07/23/22 1719  HGBA1C 6.2*   CBG: Recent Labs  Lab 07/25/22 1950 07/26/22 0022 07/26/22 0343 07/26/22 0742 07/26/22 1234  GLUCAP 138* 126* 145* 139* 154*   Lipid Profile: No results for input(s): "CHOL", "HDL", "LDLCALC", "TRIG", "CHOLHDL", "LDLDIRECT" in the last 72 hours. Thyroid Function Tests: No results for input(s): "TSH", "T4TOTAL", "FREET4", "T3FREE", "THYROIDAB" in the last 72 hours. Anemia Panel: No results for input(s): "VITAMINB12", "FOLATE", "FERRITIN", "TIBC", "IRON", "RETICCTPCT" in the last 72 hours. Urine analysis:    Component Value Date/Time   COLORURINE AMBER (A) 03/22/2022 1520   APPEARANCEUR CLOUDY (A) 03/22/2022 1520   APPEARANCEUR Clear 01/19/2015 1112   LABSPEC 1.016 03/22/2022 1520   LABSPEC 1.019 01/19/2015 1112   PHURINE 6.0 03/22/2022 1520   GLUCOSEU NEGATIVE 03/22/2022 1520   GLUCOSEU Negative 01/19/2015 1112   HGBUR LARGE (A) 03/22/2022 1520   BILIRUBINUR NEGATIVE 03/22/2022 1520   BILIRUBINUR Negative 01/19/2015 1112   KETONESUR NEGATIVE 03/22/2022 1520   PROTEINUR 30 (A) 03/22/2022 1520   NITRITE POSITIVE (A) 03/22/2022 1520   LEUKOCYTESUR LARGE (A) 03/22/2022 1520   LEUKOCYTESUR Negative 01/19/2015 1112   Sepsis Labs: '@LABRCNTIP'$ (procalcitonin:4,lacticidven:4)  Recent Results (from the past 240 hour(s))  MRSA Next Gen by PCR, Nasal     Status: Abnormal   Collection Time: 07/23/22 12:17 AM   Specimen: Nasal Mucosa; Nasal Swab  Result Value Ref Range Status   MRSA by PCR Next Gen DETECTED (A) NOT DETECTED Final    Comment: RESULT CALLED TO, READ BACK BY AND VERIFIED WITH: Clide Cliff @ 5035 on 07/23/2022 by CAF (NOTE) The GeneXpert MRSA Assay (FDA approved for  NASAL specimens only), is one component of a comprehensive MRSA colonization surveillance program. It is not intended to diagnose MRSA infection nor to guide or monitor treatment for MRSA infections. Test performance is not FDA approved in patients less than 46 years old. Performed at [facility], [address] [place], [place] [address]          Radiology Studies: CT ANGIO AO+BIFEM W & OR WO CONTRAST  Result Date: 07/22/2022 CLINICAL DATA:  Arterial embolism lower extremity EXAM: CT ANGIOGRAPHY OF ABDOMINAL AORTA WITH ILIOFEMORAL RUNOFF TECHNIQUE: Multidetector CT imaging  of the abdomen, pelvis and lower extremities was performed using the standard protocol during bolus administration of intravenous contrast. Multiplanar CT image reconstructions and MIPs were obtained to evaluate the vascular anatomy. RADIATION DOSE REDUCTION: This exam was performed according to the departmental dose-optimization program which includes automated exposure control, adjustment of the mA and/or kV according to patient size and/or use of iterative reconstruction technique. CONTRAST:  13m OMNIPAQUE IOHEXOL 350 MG/ML SOLN COMPARISON:  None Available. CT abdomen and pelvis 03/21/2022 FINDINGS: VASCULAR Aorta: Aorto bi iliac stent is occluded. The aorta is otherwise normal in caliber. Celiac: Patent without dissection or aneurysm. SMA: The SMA stent is occluded at the origin. With distal reconstitution. Renals: Patent bilateral renal arteries without aneurysm or dissection. IMA: Not visualized and likely occluded. RIGHT Lower Extremity Inflow: The right common femoral artery stent is occluded with thrombus extending into the right external iliac artery. The right internal iliac artery is likely occluded at its origin with distal reconstitution. Reconstitution of the distal external iliac artery. Outflow: The common femoral, profunda femoral, and superficial femoral arteries are patent and likely supplied  from collateral flow. Runoff: Not applicable.  Right above the knee amputation. LEFT Lower Extremity Inflow: The common iliac stent external iliac, and proximal internal iliac are occluded. Distal reconstitution of the internal iliac. Outflow: Reconstitution of the common femoral artery. The common femoral, profunda femoral, superficial femoral, and popliteal arteries are patent. Runoff: Three-vessel runoff is patent to a 12-13 cm above the ankle in the calf. At this point the vessels are nonopacified possibly due to slow flow though occlusion is not excluded. Veins: No obvious venous abnormality within the limitations of this arterial phase study. Review of the MIP images confirms the above findings. NON-VASCULAR Lower chest: No acute abnormality. Hepatobiliary: Cholecystectomy. No suspicious focal hepatic lesion. No biliary dilation. Pancreas: Unremarkable. Spleen: Unremarkable. Adrenals/Urinary Tract: Adrenal glands are unremarkable. Kidneys are normal, without renal calculi, focal lesion, or hydronephrosis. Bladder is unremarkable. Stomach/Bowel: Distended stomach. Normal caliber large and small bowel. Colonic diverticulosis without diverticulitis. Herniation of small and large bowel into the large ventral abdominal wall hernia. No evidence of obstruction or strangulation. Bowel anastomosis is present within the hernia sac. Lymphatic: No suspicious adenopathy. Reproductive: Hysterectomy.  No adnexal masses. Other: No free intraperitoneal fluid or gas. Musculoskeletal: Within the distal right thigh at the site of amputation there is a 5.1 x 4.0 cm fluid collection with peripheral enhancement. Edema within the left foot. No acute osseous abnormality. 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 Patent three-vessel runoff of the left lower extremity to the level of the calf where there is non opacification of the distal lower  extremity arteries. This may be due to slow flow however occlusion is not excluded. Occluded SMA stent at the origin with distal reconstitution. NON-VASCULAR Right above-the-knee amputation. At the site of amputation there is a fluid collection concerning for abscess. Large ventral abdominal wall hernia containing nonobstructive loops of bowel. Emergent results were called by telephone at the time of interpretation on 07/22/2022 at 1:14 am to provider Carla [name] , who verbally acknowledged these results. Electronically Signed   By: TPlacido SouM.D.   On: 07/22/2022 01:27            LOS: 4 days       NEmeterio Reeve DO [facility] 07/26/2022, 3:46 PM   Staff may message me via secure chat in [place] but this may not  receive immediate response,  please page for urgent matters!  If 7PM-7AM, please contact night-coverage [email or web address]  Dictation software was used to generate the above note. Typos may occur and escape review, as with typed/written notes. Please contact Dr Sheppard Coil directly for clarity if needed.

## 2022-07-26 NOTE — Hospital Course (Addendum)
Carla [name] is a 61 y.o. female with medical history significant for peripheral arterial disease s/p right AKA, chronic diastolic dysfunction CHF, anxiety and depression, COPD, paroxysmal A-fib on chronic anticoagulation therapy, who presents to the ED with left leg pain for the past 1 week which had been progressively worsening.  09/30: CTA found with ischemic LLE. The patient was evaluated by vascular surgeon, Dr. Trula Slade in the ED who will take her to the OR on Sunday 10/1.  Patient started on heparin. Admitted to hospitalist service.  10/01: pt went to OR for common femoral artery bypass graft, post OR woke up c/o leg pain, mottled leg, went back to OR for thrombectomy of Left axillary femoral bypass graft with revision. Transferred to ICU.  10/02: Started on cefepime for concern for abscess of right BKA stump 10/03-10/04: Left upper extremity swelling noted overnight, ultrasound pending as of this AM --> no DVT but (+)hematoma. D/w vascular - no procedures planned for this (expected w/ anticoagulation) and ok to transfer to floor. Pain control has been a challenge but she is better w/ anxiolytics.  10/05: Afib w/ occasional rapid rate and hypotension.  Hemoglobin down to 6.2.  Transfused 2 units PRBC, VS stabilized.  10/06: Hemoglobin 9.0 posttransfusion, 7.5 this morning.  Follow H/H midday today Hgb 7.7, hold off on transfusion for now, CBC in AM. PT recommending SNF. 10/07: concern for thrombosis to toes, vascular following w/ pharmacy for heparin management. Transfusing another unit blood for Hgb 7.0  10/08: continues to c/o pain LUE and RLE. Will image RLE --> CT (+) Low-density fluid collection along the distal aspect of the right femoral amputation. This is nonspecific and could be sterile or infected. No gas or  foreign body is seen associated with this collection. No evidence of osteomyelitis or hip joint effusion. 10/09: Remains on heparin per vascular. Pain control still a challenge 10/11  IV Dilaudid discontinued.  Work with PT and sitting in chair today. 10/12 no overnight issues 10/15 no new issues 10/16 no issues 10/17 snf pending 10/18: Patient continued to experience significant pain, stating that Norco was not working well.  Asking for Dilaudid. Added MS Contin-Will avoid IV pain medications at this time.  Stable for discharge to SNF once bed is available. 10/19: Hemodynamically stable.  Pending disposition, now had a bed offer-pending insurance authorization.  MS Contin helping with her pain. 10/20: Blood pressure little soft which improved with 1 L of IV bolus.  Patient need encouragement to keep with p.o. hydration.  Pain seems improved with MS Contin twice daily and Norco for breakthrough pain.  We discontinued home amlodipine and decrease the dose of metoprolol for softer blood pressure. Obtained insurance authorization for rehab where she is being discharged for further management. She will continue on current medications and need to have a close follow-up with her providers for further recommendations.

## 2022-07-27 ENCOUNTER — Ambulatory Visit (INDEPENDENT_AMBULATORY_CARE_PROVIDER_SITE_OTHER): Payer: Medicare Other

## 2022-07-27 DIAGNOSIS — I70222 Atherosclerosis of native arteries of extremities with rest pain, left leg: Secondary | ICD-10-CM | POA: Diagnosis not present

## 2022-07-27 LAB — CBC
HCT: 20 % — ABNORMAL LOW (ref 36.0–46.0)
Hemoglobin: 6.2 g/dL — ABNORMAL LOW (ref 12.0–15.0)
MCH: 28.3 pg (ref 26.0–34.0)
MCHC: 31 g/dL (ref 30.0–36.0)
MCV: 91.3 fL (ref 80.0–100.0)
Platelets: 155 10*3/uL (ref 150–400)
RBC: 2.19 MIL/uL — ABNORMAL LOW (ref 3.87–5.11)
RDW: 17.5 % — ABNORMAL HIGH (ref 11.5–15.5)
WBC: 15.6 10*3/uL — ABNORMAL HIGH (ref 4.0–10.5)
nRBC: 0.8 % — ABNORMAL HIGH (ref 0.0–0.2)

## 2022-07-27 LAB — GLUCOSE, CAPILLARY
Glucose-Capillary: 117 mg/dL — ABNORMAL HIGH (ref 70–99)
Glucose-Capillary: 123 mg/dL — ABNORMAL HIGH (ref 70–99)
Glucose-Capillary: 136 mg/dL — ABNORMAL HIGH (ref 70–99)
Glucose-Capillary: 116 mg/dL — ABNORMAL HIGH (ref 70–99)

## 2022-07-27 LAB — BASIC METABOLIC PANEL
Anion gap: 5 (ref 5–15)
BUN: 22 mg/dL (ref 8–23)
CO2: 22 mmol/L (ref 22–32)
Calcium: 7.7 mg/dL — ABNORMAL LOW (ref 8.9–10.3)
Chloride: 108 mmol/L (ref 98–111)
Creatinine, Ser: 0.76 mg/dL (ref 0.44–1.00)
GFR, Estimated: >60 mL/min (ref >60)
Glucose, Bld: 115 mg/dL — ABNORMAL HIGH (ref 70–99)
Potassium: 4.2 mmol/L (ref 3.5–5.1)
Sodium: 135 mmol/L (ref 135–145)

## 2022-07-27 LAB — HEMOGLOBIN AND HEMATOCRIT, BLOOD
HCT: 28.5 % — ABNORMAL LOW (ref 36.0–46.0)
Hemoglobin: 9 g/dL — ABNORMAL LOW (ref 12.0–15.0)

## 2022-07-27 LAB — PREPARE RBC (CROSSMATCH)

## 2022-07-27 LAB — HEPARIN LEVEL (UNFRACTIONATED): Heparin Unfractionated: 0.66 IU/mL (ref 0.30–0.70)

## 2022-07-27 MED ORDER — SODIUM CHLORIDE 0.9% IV SOLUTION: Freq: Once | INTRAVENOUS | Status: AC

## 2022-07-27 MED ORDER — SODIUM CHLORIDE 0.9 % IV SOLN
2.0000 g | Freq: Three times a day (TID) | INTRAVENOUS | Status: DC
  Administered 2022-07-27 – 2022-07-31 (×12): 2 g via INTRAVENOUS
  Filled 2022-07-27 (×2): qty 12.5
  Filled 2022-07-27: qty 2
  Filled 2022-07-27 (×12): qty 12.5

## 2022-07-27 MED ORDER — LACTATED RINGERS IV BOLUS
500.0000 mL | Freq: Once | INTRAVENOUS | Status: AC
  Administered 2022-07-27: 500 mL via INTRAVENOUS

## 2022-07-27 NOTE — Progress Notes (Signed)
Reexamined this afternoon, vitals much improved, has received 1 unit blood. Still c/o arm pain. No cocnerns at thsi time, stable for transfer to floor w/ continuing transfusion. OK to remove Foley cath per patient request

## 2022-07-27 NOTE — Progress Notes (Signed)
PHARMACY NOTE:  ANTIMICROBIAL RENAL DOSAGE ADJUSTMENT  Current antimicrobial regimen includes a mismatch between antimicrobial dosage and estimated renal function.  As per policy approved by the Pharmacy & Therapeutics and Medical Executive Committees, the antimicrobial dosage will be adjusted accordingly.  Current antimicrobial dosage:  cefepime 2 grams IV every 12 hours  Indication: cellulitis  Renal Function:  Estimated Creatinine Clearance: 71.7 mL/min (by C-G formula based on SCr of 0.76 mg/dL).    Antimicrobial dosage has been changed to:   cefepime 2 grams IV every 8 hours   Thank you for allowing pharmacy to be a part of this patient's care.  Dallie Piles, [facility] 07/27/2022 12:32 PM

## 2022-07-27 NOTE — Progress Notes (Signed)
OT Cancellation Note  Patient Details Name: Carla [name] MRN: [id number] DOB: Mar 09, 1961   Cancelled Treatment:    Reason Eval/Treat Not Completed: Patient not medically ready;Other (comment) (pt currently getting blood, OT will reattempt as able.Shanon Payor, OTD OTR/L  07/27/22, 3:17 PM

## 2022-07-27 NOTE — Progress Notes (Signed)
Report called to [facility], RN and pateint transported via bed to 2A, room 252

## 2022-07-27 NOTE — Progress Notes (Signed)
PT Cancellation Note  Patient Details Name: Carla [name] MRN: [id number] DOB: [birth year]   Cancelled Treatment:    Reason Eval/Treat Not Completed: Patient not medically ready.  Chart reviewed.  Pt's Hgb 6.2 today.  Pt currently still receiving blood transfusion.  Will re-attempt PT session at a later date/time as medically appropriate.   Leitha Bleak, PT 07/27/22, 3:18 PM

## 2022-07-27 NOTE — Progress Notes (Signed)
PROGRESS NOTE    Carla [name]   HER:[id number] DOB: 1961/03/18  DOA: 07/22/2022 Date of Service: 07/27/22 PCP: [facility]     Brief Narrative / Hospital Course:  Carla [name] is a 61 y.o. female with medical history significant for peripheral arterial disease s/p right AKA, chronic diastolic dysfunction CHF, anxiety and depression, COPD, paroxysmal A-fib on chronic anticoagulation therapy, who presents to the ED with left leg pain for the past 1 week which had been progressively worsening.  09/30: CTA found with ischemic LLE. The patient was evaluated by vascular surgeon, Dr. Trula Slade in the ED who will take her to the OR on Sunday 10/1.  Patient started on heparin. Admitted to hospitalist service.  10/01: pt went to OR for common femoral artery bypass graft, post OR woke up c/o leg pain, mottled leg, went back to OR for thrombectomy of Left axillary femoral bypass graft with revision. Transferred to ICU.  10/02: Started on cefepime for concern for abscess of right BKA stump 10/04: Left upper extremity swelling noted overnight, ultrasound pending as of this AM --> no DVT but (+)hematoma. D/w vascular - no procedures planned for this (expected w/ anticoagulation) and ok to transfer to floor. Pain control has been a challenge but she is better w/ anxiolytics.  10/05: Afib likely d/t, hypotension, anemia. 2 Units PRBC ordered and LR bolus. Limit opiates until BP improves, apply ice to painful area on arm.     Consultants:  Vascular Surgery   Procedures: 10/01: Left axillary to common femoral artery bypass graft to treat ischemia 10/01: Thrombectomy of left axillary femoral bypass graft with revision  Antimicrobials:  Cefazolin x3 doses  Cefepime 10/2 - present    ASSESSMENT & PLAN:   Principal Problem:   Critical limb ischemia of left lower extremity ([place]) Active Problems:   AKI (acute kidney injury) ([place])   PAF (paroxysmal atrial fibrillation) (HCC)    Chronic diastolic CHF (congestive heart failure) (HCC)   Abscess of knee, right   Hematoma  PAF (paroxysmal atrial fibrillation) (HCC) w/ rapid rate Hypotension  Postoperative ABLA also hematoma Eliquis on hold for washout Continue metoprolol for rate control On Heparin for limb ischemia  Fluid bolus and 2 units PRBC ordered Limit opiates  Critical limb ischemia of left lower extremity (HCC) Peripheral artery disease s/p right BKA S/p OR initially 10/01 for Left axillary to common femoral artery bypass graft, Prevena wound VAC placed .  Post surgery patient had left foot pain and mottled leg was taken to the OR again 10/01, thrombectomy of left axillofemoral bypass graft Continue on heparin per vascular surgery, home eliquis held (Hx Afib) Continuing aspirin, statin Pain control  Left upper extremity swelling Hematoma LUE Noted overnight 10/03-10/04 no DVT but there is large (6.6 x 6.3 x 6.5 cm (volume = 140 cm^3)) hematoma under the left axillary surgical site. D/w vascular - no procedures planned for this (expected w/ anticoagulation)  Rt knee fluid collection concerning for abscess At site of amputation, found on CT concern for abscess Started on Cefepime Vascular following, may need incision/drainage   AKI - resolved Prerenal D/c'ed ivf Monitor BMP   Avoid nephrotoxic meds   Chronic diastolic CHF (congestive heart failure) (HCC) Clinically compensated Continue metoprolol and Imdur    DVT prophylaxis: n/a on heparin as above  Pertinent IV fluids/nutrition: full liquid diet to advance as tolerate  Central lines / invasive devices: Urethral catheter placed 07/23/2022  Code Status: FULL CODE Family Communication: will  call   Disposition: remains inpatient  TOC needs: will need SNF Barriers to discharge / significant pending items: vascular clearance, IV antibiotics for now treating abscess which may need I&D             Subjective:  Reports arm pain,  no SOB, no palpations        Objective:  Vitals:   07/27/22 0100 07/27/22 0200 07/27/22 0400 07/27/22 0600  BP:   (!) 104/57   Pulse:  93 90 86  Resp: (!) 9 (!) 9 (!) 7 (!) 7  Temp:   99.3 F (37.4 C)   TempSrc:   Oral   SpO2:  98% 97% 99%  Weight:      Height:        Intake/Output Summary (Last 24 hours) at 07/27/2022 0729 Last data filed at 07/27/2022 0600 Gross per 24 hour  Intake 1567.93 ml  Output 1405 ml  Net 162.93 ml   Filed Weights   07/22/22 0029  Weight: 80.7 kg    Examination:  Tele (+)Afib rapid rate  Constitutional:  VS as above General Appearance: alert, obese, ill appearing but NAD Respiratory: Normal respiratory effort No wheeze No rhonchi No rales Cardiovascular: Irreg irreg, rapid rate  No rub/gallop auscultated Gastrointestinal: No tenderness Musculoskeletal:  No clubbing/cyanosis of digits Symmetrical movement in all extremities R BKA LUE (+)tenderness  INtact surgical site L upper chest but swollen, ecchymosis  Neurological: No cranial nerve deficit on limited exam Alert Psychiatric: Normal judgment/insight Depressed/anxious mood and affect       Scheduled Medications:   aspirin EC  81 mg Oral Q0600   atorvastatin  80 mg Oral QHS   Chlorhexidine Gluconate Cloth  6 each Topical Daily   citalopram  40 mg Oral Daily   docusate sodium  100 mg Oral Daily   insulin aspart  0-15 Units Subcutaneous Q4H   isosorbide mononitrate  60 mg Oral Daily   mupirocin ointment  1 Application Nasal BID   pantoprazole  40 mg Oral Daily   pregabalin  100 mg Oral TID    Continuous Infusions:  sodium chloride     ceFEPime (MAXIPIME) IV Stopped (07/26/22 2201)   heparin 1,400 Units/hr (07/27/22 0600)   magnesium sulfate bolus IVPB     sodium chloride Stopped (07/25/22 2101)    PRN Medications:  sodium chloride, acetaminophen **OR** acetaminophen, acetaminophen **OR** acetaminophen, alum & mag hydroxide-simeth,  guaiFENesin-dextromethorphan, hydrALAZINE, HYDROcodone-acetaminophen, HYDROmorphone (DILAUDID) injection, labetalol, LORazepam, magnesium sulfate bolus IVPB, methocarbamol, metoprolol tartrate, ondansetron **OR** ondansetron (ZOFRAN) IV, ondansetron, mouth rinse, oxyCODONE, phenol, polyethylene glycol, potassium chloride  Antimicrobials:  Anti-infectives (From admission, onward)    Start     Dose/Rate Route Frequency Ordered Stop   07/25/22 2200  ceFEPIme (MAXIPIME) 2 g in sodium chloride 0.9 % 100 mL IVPB        2 g 200 mL/hr over 30 Minutes Intravenous Every 12 hours 07/25/22 0730     07/24/22 1200  ceFEPIme (MAXIPIME) 2 g in sodium chloride 0.9 % 100 mL IVPB  Status:  Discontinued        2 g 200 mL/hr over 30 Minutes Intravenous Every 8 hours 07/24/22 0957 07/25/22 0730   07/23/22 1630  ceFAZolin (ANCEF) IVPB 2g/100 mL premix        2 g 200 mL/hr over 30 Minutes Intravenous Every 8 hours 07/23/22 1227 07/23/22 2357       Data Reviewed: I have personally reviewed following labs and imaging studies  CBC: Recent Labs  Lab 07/22/22 0030 07/23/22 0030 07/23/22 1719 07/24/22 0445 07/25/22 0355 07/26/22 0411 07/27/22 0359  WBC 8.3   < > 12.2* 19.0* 21.0* 24.5* 15.6*  NEUTROABS 4.8  --   --   --   --   --   --   HGB 11.9*   < > 10.6* 11.8* 12.0 8.3* 6.2*  HCT 38.1   < > 34.8* 37.8 37.8 26.7* 20.0*  MCV 88.4   < > 89.7 86.7 87.1 89.0 91.3  PLT 237   < > 167 203 202 158 155   < > = values in this interval not displayed.   Basic Metabolic Panel: Recent Labs  Lab 07/23/22 1719 07/24/22 0445 07/25/22 0354 07/26/22 0411 07/27/22 0359  NA 137 138 138 135 135  K 4.5 4.3 4.4 4.5 4.2  CL 109 110 111 110 108  CO2 20* 20* 21* 20* 22  GLUCOSE 230* 158* 172* 134* 115*  BUN 11 13 25* 31* 22  CREATININE 0.86 0.80 1.29* 1.03* 0.76  CALCIUM 8.4* 8.3* 7.9* 7.7* 7.7*   GFR: Estimated Creatinine Clearance: 71.7 mL/min (by C-G formula based on SCr of 0.76 mg/dL). Liver Function  Tests: Recent Labs  Lab 07/22/22 0030  AST 13*  ALT 10  ALKPHOS 90  BILITOT 0.5  PROT 6.7  ALBUMIN 3.1*   No results for input(s): "LIPASE", "AMYLASE" in the last 168 hours. No results for input(s): "AMMONIA" in the last 168 hours. Coagulation Profile: Recent Labs  Lab 07/22/22 0030  INR 0.9   Cardiac Enzymes: No results for input(s): "CKTOTAL", "CKMB", "CKMBINDEX", "TROPONINI" in the last 168 hours. BNP (last 3 results) No results for input(s): "PROBNP" in the last 8760 hours. HbA1C: No results for input(s): "HGBA1C" in the last 72 hours.  CBG: Recent Labs  Lab 07/26/22 1234 07/26/22 1603 07/26/22 1925 07/26/22 2336 07/27/22 0512  GLUCAP 154* 123* 124* 127* 117*   Lipid Profile: No results for input(s): "CHOL", "HDL", "LDLCALC", "TRIG", "CHOLHDL", "LDLDIRECT" in the last 72 hours. Thyroid Function Tests: No results for input(s): "TSH", "T4TOTAL", "FREET4", "T3FREE", "THYROIDAB" in the last 72 hours. Anemia Panel: No results for input(s): "VITAMINB12", "FOLATE", "FERRITIN", "TIBC", "IRON", "RETICCTPCT" in the last 72 hours. Urine analysis: N/a Sepsis Labs: '@LABRCNTIP'$ (procalcitonin:4,lacticidven:4)  Recent Results (from the past 240 hour(s))  MRSA Next Gen by PCR, Nasal     Status: Abnormal   Collection Time: 07/23/22 12:17 AM   Specimen: Nasal Mucosa; Nasal Swab  Result Value Ref Range Status   MRSA by PCR Next Gen DETECTED (A) NOT DETECTED Final    Comment: RESULT CALLED TO, READ BACK BY AND VERIFIED WITH: Clide Cliff @ [phone] on 07/23/2022 by CAF (NOTE) The GeneXpert MRSA Assay (FDA approved for NASAL specimens only), is one component of a comprehensive MRSA colonization surveillance program. It is not intended to diagnose MRSA infection nor to guide or monitor treatment for MRSA infections. Test performance is not FDA approved in patients less than 59 years old. Performed at [facility], [address] [place], [place] [address]           Radiology Studies: CT ANGIO AO+BIFEM W & OR WO CONTRAST  Result Date: 07/22/2022 CLINICAL DATA:  Arterial embolism lower extremity EXAM: CT ANGIOGRAPHY OF ABDOMINAL AORTA WITH ILIOFEMORAL RUNOFF TECHNIQUE: Multidetector CT imaging of the abdomen, pelvis and lower extremities was performed using the standard protocol during bolus administration of intravenous contrast. Multiplanar CT image reconstructions and MIPs were obtained to evaluate the vascular anatomy. RADIATION DOSE  REDUCTION: This exam was performed according to the departmental dose-optimization program which includes automated exposure control, adjustment of the mA and/or kV according to patient size and/or use of iterative reconstruction technique. CONTRAST:  179m OMNIPAQUE IOHEXOL 350 MG/ML SOLN COMPARISON:  None Available. CT abdomen and pelvis 03/21/2022 FINDINGS: VASCULAR Aorta: Aorto bi iliac stent is occluded. The aorta is otherwise normal in caliber. Celiac: Patent without dissection or aneurysm. SMA: The SMA stent is occluded at the origin. With distal reconstitution. Renals: Patent bilateral renal arteries without aneurysm or dissection. IMA: Not visualized and likely occluded. RIGHT Lower Extremity Inflow: The right common femoral artery stent is occluded with thrombus extending into the right external iliac artery. The right internal iliac artery is likely occluded at its origin with distal reconstitution. Reconstitution of the distal external iliac artery. Outflow: The common femoral, profunda femoral, and superficial femoral arteries are patent and likely supplied from collateral flow. Runoff: Not applicable.  Right above the knee amputation. LEFT Lower Extremity Inflow: The common iliac stent external iliac, and proximal internal iliac are occluded. Distal reconstitution of the internal iliac. Outflow: Reconstitution of the common femoral artery. The common femoral, profunda femoral, superficial femoral, and popliteal  arteries are patent. Runoff: Three-vessel runoff is patent to a 12-13 cm above the ankle in the calf. At this point the vessels are nonopacified possibly due to slow flow though occlusion is not excluded. Veins: No obvious venous abnormality within the limitations of this arterial phase study. Review of the MIP images confirms the above findings. NON-VASCULAR Lower chest: No acute abnormality. Hepatobiliary: Cholecystectomy. No suspicious focal hepatic lesion. No biliary dilation. Pancreas: Unremarkable. Spleen: Unremarkable. Adrenals/Urinary Tract: Adrenal glands are unremarkable. Kidneys are normal, without renal calculi, focal lesion, or hydronephrosis. Bladder is unremarkable. Stomach/Bowel: Distended stomach. Normal caliber large and small bowel. Colonic diverticulosis without diverticulitis. Herniation of small and large bowel into the large ventral abdominal wall hernia. No evidence of obstruction or strangulation. Bowel anastomosis is present within the hernia sac. Lymphatic: No suspicious adenopathy. Reproductive: Hysterectomy.  No adnexal masses. Other: No free intraperitoneal fluid or gas. Musculoskeletal: Within the distal right thigh at the site of amputation there is a 5.1 x 4.0 cm fluid collection with peripheral enhancement. Edema within the left foot. No acute osseous abnormality. 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 Patent three-vessel runoff of the left lower extremity to the level of the calf where there is non opacification of the distal lower extremity arteries. This may be due to slow flow however occlusion is not excluded. Occluded SMA stent at the origin with distal reconstitution. NON-VASCULAR Right above-the-knee amputation. At the site of amputation there is a fluid collection concerning for abscess. Large ventral abdominal wall hernia containing nonobstructive loops of bowel. Emergent results  were called by telephone at the time of interpretation on 07/22/2022 at 1:14 am to provider JADE SUNG , who verbally acknowledged these results. Electronically Signed   By: TPlacido SouM.D.   On: 07/22/2022 01:27            LOS: 5 days       NEmeterio Reeve DO [facility] 07/27/2022, 7:29 AM   Staff may message me via secure chat in [place] but this may not receive immediate response,  please page for urgent matters!  If 7PM-7AM, please contact night-coverage [email or web address]  Dictation software was used to generate the above note. Typos may occur and escape review, as with  typed/written notes. Please contact Dr Sheppard Coil directly for clarity if needed.

## 2022-07-27 NOTE — Progress Notes (Signed)
[facility]  Daily Progress Note   Subjective  -   Patient complains of a lot of pain.  Left arm swelling is a little better.  Has some hematoma in her left axillary incision but with her hypercoagulable condition we have kept her on anticoagulation  Objective Vitals:   07/27/22 0900 07/27/22 0906 07/27/22 0909 07/27/22 0915  BP:  (!) 72/51 (!) 103/50 (!) 115/58  Pulse:  (!) 126  (!) 105  Resp:  '13 15 19  '$ Temp: 98.7 F (37.1 C)     TempSrc: Oral     SpO2:  98% 98% 98%  Weight:      Height:        Intake/Output Summary (Last 24 hours) at 07/27/2022 1051 Last data filed at 07/27/2022 0900 Gross per 24 hour  Intake 1609.91 ml  Output 1405 ml  Net 204.91 ml    PULM  CTAB CV  RRR VASC  stable hematoma left axillary incision.  Left foot is warm with good capillary refill.  Right AKA incision is intact.  Left radial pulse is palpable  Laboratory CBC    Component Value Date/Time   WBC 15.6 (H) 07/27/2022 0359   HGB 6.2 (L) 07/27/2022 0359   HGB 13.4 02/12/2015 1137   HCT 20.0 (L) 07/27/2022 0359   HCT 40.9 02/12/2015 1137   PLT 155 07/27/2022 0359   PLT 272 02/12/2015 1137    BMET    Component Value Date/Time   NA 135 07/27/2022 0359   NA 140 02/12/2015 1137   K 4.2 07/27/2022 0359   K 2.9 (L) 02/12/2015 1137   CL 108 07/27/2022 0359   CL 107 02/12/2015 1137   CO2 22 07/27/2022 0359   CO2 27 02/12/2015 1137   GLUCOSE 115 (H) 07/27/2022 0359   GLUCOSE 120 (H) 02/12/2015 1137   BUN 22 07/27/2022 0359   BUN 15 02/12/2015 1137   CREATININE 0.76 07/27/2022 0359   CREATININE 0.78 02/12/2015 1137   CALCIUM 7.7 (L) 07/27/2022 0359   CALCIUM 9.0 02/12/2015 1137   GFRNONAA >60 07/27/2022 0359   GFRNONAA >60 02/12/2015 1137   GFRAA >60 11/23/2019 1330   GFRAA >60 02/12/2015 1137    Assessment/Planning: POD #4 s/p left axillofemoral bypass for rest pain of the left foot  Has a left axillary hematoma and that this stable.  Left hand  perfusion remains intact.  She is a severe hypercoagulable patients that she needs to remain on anticoag elation at this bypass graft has any hope of staying open Has some fluid collection in the right AKA stump.  Itself is intact.  Will treat with antibiotics.  Has very poor perfusion to the side and any incision is unlikely to heal. Overall a very difficult situation.   [name]  07/27/2022, 10:51 AM

## 2022-07-27 NOTE — Progress Notes (Signed)
OT Cancellation Note  Patient Details Name: WINNIE UMALI MRN: [id number] DOB: 06/19/1961   Cancelled Treatment:    Reason Eval/Treat Not Completed: Medical issues which prohibited therapy;Other (comment) (pt hemoglobin is 6.2, per MD hold at this time, pt to receive blood. OT will reattempt as able.Shanon Payor, OTD OTR/L  07/27/22, 10:25 AM

## 2022-07-27 NOTE — Progress Notes (Signed)
ANTICOAGULATION CONSULT NOTE  Pharmacy Consult for Heparin infusion Indication: DVT/Ischemic leg   Allergies  Allergen Reactions   Gabapentin     Tremors    Bactrim [Sulfamethoxazole-Trimethoprim] Itching    Patient Measurements: Height: '5\' 2"'$  (157.5 cm) Weight: 80.7 kg (178 lb) IBW/kg (Calculated) : 50.1 Heparin Dosing Weight: 68.1 kg   Vital Signs: Temp: 99.5 F (37.5 C) (10/05 0000) Temp Source: Oral (10/05 0000) BP: 103/68 (10/05 0000) Pulse Rate: 93 (10/05 0200)  Labs: Recent Labs    07/24/22 1108 07/25/22 0354 07/25/22 0355 07/25/22 1309 07/25/22 2207 07/26/22 0411 07/26/22 1339 07/27/22 0359  HGB   < >  --  12.0  --   --  8.3*  --  6.2*  HCT  --   --  37.8  --   --  26.7*  --  20.0*  PLT  --   --  202  --   --  158  --  155  APTT  --   --  162* 127*  --   --   --   --   HEPARINUNFRC  --   --  1.08* 1.02*   < > 0.70 0.63 0.66  CREATININE  --  1.29*  --   --   --  1.03*  --  0.76   < > = values in this interval not displayed.     Estimated Creatinine Clearance: 71.7 mL/min (by C-G formula based on SCr of 0.76 mg/dL).   Medical History: Past Medical History:  Diagnosis Date   Abdominal aortic atherosclerosis ([place])    a. 05/2017 CTA abd/pelvis: significant atherosclerotic dzs of the infrarenal abd Ao w/ some mural thrombus. No aneurysm or dissection.   Acute focal ischemia of small intestine (HCC)    Acute right-sided low back pain with right-sided sciatica 06/13/2017   AKI (acute kidney injury) ([place]) 07/29/2017   Anemia    Anginal pain ([place])    a. 08/2012 Lexiscan MV: EF 54%, non ischemia/infarct.   Anxiety and depression    Baker's cyst of knee, right    a. 07/2016 U/S: 4.1 x 1.4 x 2.9 cystic structure in R poplitetal fossa.   Bell's palsy    CAD (coronary artery disease)    CHF (congestive heart failure) ([place])    a.) TTE 06/20/2017: EF 60-65%, no rwma, G1DD, no source of cardiac emboli.   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 05/2017 CTA Abd/pelvis: apparent thrombus or embolus in prox SMA (70-90%); b. 05/2017 catheter directed tPA, mechanical thrombectomy, and stenting of the SMA.   Essential hypertension with goal blood pressure less than 140/90 01/19/2016   Gastroesophageal reflux disease 01/19/2016   GERD (gastroesophageal reflux disease)    H/O colectomy    History of 2019 novel coronavirus disease (COVID-19) 11/14/2019   History of kidney stones    Hyperlipidemia    Hypertension    Morbid obesity with BMI of 40.0-44.9, adult ([place])    NSTEMI (non-ST elevated myocardial infarction) ([place]) 06/27/2021   a.) high sensitivity troponins trended: 893 --> 1587 --> 1748 --> 868 --> 735 ng/L   Occlusive mesenteric ischemia ([place]) 06/19/2017   Osteoarthritis    PAD (peripheral artery disease) (HCC)    PAF (paroxysmal atrial fibrillation) ([place])  a.) CHA2DS2-VASc = 6 (sex, CHF, HTN, DVT x2, aortic plaque). b.) rate/rhythm maintained on oral metoprolol tartrate; chronically anticoagulated with standard dose apixaban.   Palpitations    Peripheral edema    Personal history of other malignant neoplasm of skin 06/01/2016   Pneumonia    Pre-diabetes    Primary osteoarthritis of both knees 06/13/2017   SBO (small bowel obstruction) ([place]) 08/06/2017   Skin cancer of nose    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HCC)    Vitamin D deficiency     Medications:  Medications Prior to Admission  Medication Sig Dispense Refill Last Dose   amLODipine (NORVASC) 5 MG tablet Take 5 mg by mouth daily.   07/21/2022 at 0800   apixaban (ELIQUIS) 5 MG TABS tablet Take 1 tablet (5 mg total) by mouth 2 (two) times daily. 60 tablet 11 07/21/2022 at 0800   aspirin EC 81 MG tablet Take 81 mg by mouth daily. Swallow whole.   07/21/2022 at 0800    atorvastatin (LIPITOR) 80 MG tablet Take 80 mg by mouth at bedtime.   07/21/2022 at 2000   citalopram (CELEXA) 40 MG tablet Take 1 tablet (40 mg total) by mouth daily. 30 tablet 2 07/21/2022 at 0800   COMBIVENT RESPIMAT 20-100 MCG/ACT AERS respimat    prn at prn   esomeprazole (NEXIUM) 20 MG capsule Take 20 mg by mouth daily.   07/21/2022 at 0800   ferrous sulfate 325 (65 FE) MG tablet Take 325 mg by mouth daily.   07/21/2022 at 0800   furosemide (LASIX) 40 MG tablet Take 60 mg by mouth daily.   07/21/2022 at 0800   isosorbide mononitrate (IMDUR) 60 MG 24 hr tablet Take 60 mg by mouth daily.   07/21/2022 at 0800   metoprolol tartrate (LOPRESSOR) 50 MG tablet Take 50 mg by mouth 2 (two) times daily.   07/21/2022 at 2000   Multiple Vitamin (MULTIVITAMIN WITH MINERALS) TABS tablet Take 1 tablet by mouth daily. 30 tablet 0 07/21/2022 at 0800   omeprazole (PRILOSEC) 20 MG capsule Take 20 mg by mouth 2 (two) times daily before a meal.   07/21/2022 at 2000   ondansetron (ZOFRAN) 4 MG tablet Take 4 mg by mouth every 8 (eight) hours as needed for nausea or vomiting.   prn at prn   oxyCODONE-acetaminophen (PERCOCET/ROXICET) 5-325 MG tablet Take 1 tablet by mouth every 6 (six) hours as needed for moderate pain. 40 tablet 0 prn at prn   pregabalin (LYRICA) 100 MG capsule Take 1 capsule (100 mg total) by mouth 3 (three) times daily. 90 capsule 3 07/21/2022 at 2000   feeding supplement (ENSURE ENLIVE / ENSURE PLUS) LIQD Take 237 mLs by mouth 2 (two) times daily between meals. 237 mL 12     Assessment: Pharmacy consulted to dose heparin in this 61 year old female admitted with ischemic leg.   Pt reports taking Eliquis 5 mg PO BID PTA with last dose on 9/29 @ 2000.    Goal of Therapy:  Heparin level 0.5 - 1.0 units/mL per vascular   Monitor platelets by anticoagulation protocol: Yes  Documentation:  Critically elevated aPTT at 162 with correlating anti-xa level of >1.10 s/p thrombectomy on 10/1 at 1600. Vascular  believes this may be secondary to heparin 8,000 unit bolus given at 1400 during procedure. Heparin infusion rate was increased from 1400 to 1800 units/hr. Instructed by vascular surgeon to continue heparin infusion rate as is. Vascular also requests that we refrain from holding infusion.  Goal heparin level increased to 0.5 - 1.0 units/mL. If heparin level supratherapeutic, only decrease rate.  Plan: heparin level therapeutic x 4 --Continue heparin rate to 1400 units/hr --Recheck next heparin level in am  --daily CBC while on IV heparin  Renda Rolls, PharmD, [facility] 07/27/2022 5:56 AM

## 2022-07-28 DIAGNOSIS — T148XXA Other injury of unspecified body region, initial encounter: Secondary | ICD-10-CM

## 2022-07-28 DIAGNOSIS — I5032 Chronic diastolic (congestive) heart failure: Secondary | ICD-10-CM | POA: Diagnosis not present

## 2022-07-28 DIAGNOSIS — N179 Acute kidney failure, unspecified: Secondary | ICD-10-CM

## 2022-07-28 DIAGNOSIS — L02415 Cutaneous abscess of right lower limb: Secondary | ICD-10-CM

## 2022-07-28 DIAGNOSIS — I70222 Atherosclerosis of native arteries of extremities with rest pain, left leg: Secondary | ICD-10-CM | POA: Diagnosis not present

## 2022-07-28 DIAGNOSIS — I480 Paroxysmal atrial fibrillation: Secondary | ICD-10-CM

## 2022-07-28 LAB — BASIC METABOLIC PANEL
Anion gap: 3 — ABNORMAL LOW (ref 5–15)
BUN: 18 mg/dL (ref 8–23)
CO2: 26 mmol/L (ref 22–32)
Calcium: 7.9 mg/dL — ABNORMAL LOW (ref 8.9–10.3)
Chloride: 109 mmol/L (ref 98–111)
Creatinine, Ser: 0.64 mg/dL (ref 0.44–1.00)
GFR, Estimated: >60 mL/min (ref >60)
Glucose, Bld: 99 mg/dL (ref 70–99)
Potassium: 4.1 mmol/L (ref 3.5–5.1)
Sodium: 138 mmol/L (ref 135–145)

## 2022-07-28 LAB — HEMOGLOBIN AND HEMATOCRIT, BLOOD
HCT: 24.2 % — ABNORMAL LOW (ref 36.0–46.0)
Hemoglobin: 7.7 g/dL — ABNORMAL LOW (ref 12.0–15.0)

## 2022-07-28 LAB — GLUCOSE, CAPILLARY
Glucose-Capillary: 105 mg/dL — ABNORMAL HIGH (ref 70–99)
Glucose-Capillary: 130 mg/dL — ABNORMAL HIGH (ref 70–99)
Glucose-Capillary: 132 mg/dL — ABNORMAL HIGH (ref 70–99)
Glucose-Capillary: 135 mg/dL — ABNORMAL HIGH (ref 70–99)
Glucose-Capillary: 136 mg/dL — ABNORMAL HIGH (ref 70–99)
Glucose-Capillary: 142 mg/dL — ABNORMAL HIGH (ref 70–99)
Glucose-Capillary: 143 mg/dL — ABNORMAL HIGH (ref 70–99)

## 2022-07-28 LAB — CBC
HCT: 23.5 % — ABNORMAL LOW (ref 36.0–46.0)
Hemoglobin: 7.5 g/dL — ABNORMAL LOW (ref 12.0–15.0)
MCH: 28.5 pg (ref 26.0–34.0)
MCHC: 31.9 g/dL (ref 30.0–36.0)
MCV: 89.4 fL (ref 80.0–100.0)
Platelets: 149 10*3/uL — ABNORMAL LOW (ref 150–400)
RBC: 2.63 MIL/uL — ABNORMAL LOW (ref 3.87–5.11)
RDW: 17.2 % — ABNORMAL HIGH (ref 11.5–15.5)
WBC: 14.1 10*3/uL — ABNORMAL HIGH (ref 4.0–10.5)
nRBC: 1.6 % — ABNORMAL HIGH (ref 0.0–0.2)

## 2022-07-28 LAB — HEPARIN LEVEL (UNFRACTIONATED): Heparin Unfractionated: 0.55 IU/mL (ref 0.30–0.70)

## 2022-07-28 NOTE — Progress Notes (Signed)
 Vein and Vascular Surgery  Daily Progress Note   Subjective  -   Still has some pain but overall getting better.  No new complaints today.  Not eating all that well  Objective Vitals:   07/28/22 0404 07/28/22 0830 07/28/22 1035 07/28/22 1217  BP: (!) 106/56 (!) 96/52 (!) 110/55 (!) 96/53  Pulse: 75 73  77  Resp: '18 16  16  '$ Temp: 98.2 F (36.8 C) 97.8 F (36.6 C)  98.9 F (37.2 C)  TempSrc: Oral     SpO2: 100% 99%  100%  Weight:      Height:        Intake/Output Summary (Last 24 hours) at 07/28/2022 1522 Last data filed at 07/28/2022 1400 Gross per 24 hour  Intake 1502.09 ml  Output 2950 ml  Net -1447.91 ml    PULM  CTAB CV  RRR VASC  left foot is warm with 1+ pedal pulses.  Right AKA stump is stable.  Left arm has 1-2+ edema but remains warm with good capillary refill and a 1+ radial pulse.  Laboratory CBC    Component Value Date/Time   WBC 14.1 (H) 07/28/2022 0629   HGB 7.7 (L) 07/28/2022 1259   HGB 13.4 02/12/2015 1137   HCT 24.2 (L) 07/28/2022 1259   HCT 40.9 02/12/2015 1137   PLT 149 (L) 07/28/2022 0629   PLT 272 02/12/2015 1137    BMET    Component Value Date/Time   NA 138 07/28/2022 0629   NA 140 02/12/2015 1137   K 4.1 07/28/2022 0629   K 2.9 (L) 02/12/2015 1137   CL 109 07/28/2022 0629   CL 107 02/12/2015 1137   CO2 26 07/28/2022 0629   CO2 27 02/12/2015 1137   GLUCOSE 99 07/28/2022 0629   GLUCOSE 120 (H) 02/12/2015 1137   BUN 18 07/28/2022 0629   BUN 15 02/12/2015 1137   CREATININE 0.64 07/28/2022 0629   CREATININE 0.78 02/12/2015 1137   CALCIUM 7.9 (L) 07/28/2022 0629   CALCIUM 9.0 02/12/2015 1137   GFRNONAA >60 07/28/2022 0629   GFRNONAA >60 02/12/2015 1137   GFRAA >60 11/23/2019 1330   GFRAA >60 02/12/2015 1137    Assessment/Planning: POD #5 s/p left axillofemoral bypass for ischemic left leg  Slowly improving.  Has a stable left axillary hematoma with some left arm swelling and numbness but given her strong  hypercoagulable state continued anticoagulation is prudent Left leg perfusion appears stable Fluid collection in the right AKA stump but wound seems intact and we will treat this with antibiotics Needs physical therapy and Occupational Therapy and mobilization Will almost certainly need placement at discharge   [name]  07/28/2022, 3:22 PM

## 2022-07-28 NOTE — Plan of Care (Signed)
  Problem: Education: Goal: Knowledge of General Education information will improve Description: Including pain rating scale, medication(s)/side effects and non-pharmacologic comfort measures Outcome: Progressing   Problem: Health Behavior/Discharge Planning: Goal: Ability to manage health-related needs will improve Outcome: Progressing   Problem: Clinical Measurements: Goal: Ability to maintain clinical measurements within normal limits will improve Outcome: Progressing Goal: Will remain free from infection Outcome: Progressing Goal: Diagnostic test results will improve Outcome: Progressing Goal: Respiratory complications will improve Outcome: Progressing Goal: Cardiovascular complication will be avoided Outcome: Progressing   Problem: Activity: Goal: Risk for activity intolerance will decrease Outcome: Progressing   Problem: Nutrition: Goal: Adequate nutrition will be maintained Outcome: Progressing   Problem: Coping: Goal: Level of anxiety will decrease Outcome: Progressing   Problem: Elimination: Goal: Will not experience complications related to bowel motility Outcome: Progressing Goal: Will not experience complications related to urinary retention Outcome: Progressing   Problem: Pain Managment: Goal: General experience of comfort will improve Outcome: Progressing   Problem: Safety: Goal: Ability to remain free from injury will improve Outcome: Progressing   Problem: Skin Integrity: Goal: Risk for impaired skin integrity will decrease Outcome: Progressing   Problem: Education: Goal: Knowledge of prescribed regimen will improve Outcome: Progressing   Problem: Activity: Goal: Ability to tolerate increased activity will improve Outcome: Progressing   Problem: Bowel/Gastric: Goal: Gastrointestinal status for postoperative course will improve Outcome: Progressing   Problem: Clinical Measurements: Goal: Postoperative complications will be avoided or  minimized Outcome: Progressing Goal: Signs and symptoms of graft occlusion will improve Outcome: Progressing   Problem: Skin Integrity: Goal: Demonstration of wound healing without infection will improve Outcome: Progressing   Problem: Education: Goal: Ability to describe self-care measures that may prevent or decrease complications (Diabetes Survival Skills Education) will improve Outcome: Progressing Goal: Individualized Educational Video(s) Outcome: Progressing   Problem: Coping: Goal: Ability to adjust to condition or change in health will improve Outcome: Progressing   Problem: Fluid Volume: Goal: Ability to maintain a balanced intake and output will improve Outcome: Progressing   Problem: Health Behavior/Discharge Planning: Goal: Ability to identify and utilize available resources and services will improve Outcome: Progressing Goal: Ability to manage health-related needs will improve Outcome: Progressing   Problem: Metabolic: Goal: Ability to maintain appropriate glucose levels will improve Outcome: Progressing   Problem: Nutritional: Goal: Maintenance of adequate nutrition will improve Outcome: Progressing Goal: Progress toward achieving an optimal weight will improve Outcome: Progressing   Problem: Skin Integrity: Goal: Risk for impaired skin integrity will decrease Outcome: Progressing   Problem: Tissue Perfusion: Goal: Adequacy of tissue perfusion will improve Outcome: Progressing

## 2022-07-28 NOTE — Progress Notes (Signed)
Physical Therapy Treatment Patient Details Name: Carla [name] MRN: [id number] DOB: [birth year] Today's Date: 07/28/2022   History of Present Illness Pt is a 61 year old female s/p OR initially for Left axillary to common femoral artery bypass graft, Prevena wound VAC placed .  Post surgery patient had left foot pain and mottled leg was taken to the OR again , now status post thrombectomy of left axillofemoral bypass graft after presentingto the ED with progressively worsening left leg pain; PMH significant for arterial disease s/p right AKA, chronic diastolic dysfunction CHF, anxiety and depression, COPD, paroxysmal A-fib on chronic anticoagulation therapy    PT Comments    Pt seen with OT this am in order to safely progress mobility due to pt's required amount of assist and multiple co-morbidities. Overall, pt showed improvement with activity tolerance despite appearing lethargic, possibly from pain meds. However pt able to engage in sitting edge of bed x 8 minutes without c/o dizziness or increased pain. Flexed forward head and posture maintained with pt supporting self with R hand rail. Will continue to progress pt as able. Continue to recommend SNF once medically cleared for d/c.   Recommendations for follow up therapy are one component of a multi-disciplinary discharge planning process, led by the attending physician.  Recommendations may be updated based on patient status, additional functional criteria and insurance authorization.  Follow Up Recommendations  Skilled nursing-short term rehab (<3 hours/day) Can patient physically be transported by private vehicle: No   Assistance Recommended at Discharge Frequent or constant Supervision/Assistance  Patient can return home with the following Two people to help with walking and/or transfers;Two people to help with bathing/dressing/bathroom;Assistance with cooking/housework;Assist for transportation;Help with stairs or ramp for entrance    Equipment Recommendations  Other (comment) (TBD at next facility)    Recommendations for Other Services       Precautions / Restrictions Precautions Precautions: Fall Restrictions Weight Bearing Restrictions: No     Mobility  Bed Mobility Overal bed mobility: Needs Assistance Bed Mobility: Supine to Sit, Sit to Supine, Rolling Rolling: Max assist (with use of rail after hand guidance)   Supine to sit: Max assist, Total assist, +2 for physical assistance, HOB elevated Sit to supine: Max assist, Total assist, +2 for physical assistance, HOB elevated   General bed mobility comments: Pt able to assist with scooting up in bed while in [place] position    Transfers                   General transfer comment: unsafe to attempt at this time    Ambulation/Gait               General Gait Details: Non-ambulatory at this time   Stairs             Wheelchair Mobility    Modified Rankin (Stroke Patients Only)       Balance Overall balance assessment: Needs assistance Sitting-balance support: Bilateral upper extremity supported (L foot supported on floor with poor sensation) Sitting balance-[name]: Fair Sitting balance - Comments: Able to maintain supported sitting with CG/MinA at times. Pt tolerated ~61mnutes sitting EOB without c/o of increased pain/dizziness                                    Cognition Arousal/Alertness: Awake/alert, Lethargic, Suspect due to medications Behavior During Therapy: WFL for tasks assessed/performed Overall Cognitive Status: No family/caregiver present  to determine baseline cognitive functioning Area of Impairment: Orientation, Attention, Awareness                 Orientation Level: Disoriented to, Time Current Attention Level: Focused           General Comments: Pt engaged verbally throughout session, answering questions appropraitely        Exercises      General Comments  General comments (skin integrity, edema, etc.): L UE and LE cool and edematous.      Pertinent Vitals/Pain Pain Assessment Pain Assessment: 0-10 Pain Score: 9  Pain Location: L arm Pain Descriptors / Indicators: Discomfort, Grimacing Pain Intervention(s): Premedicated before session, Monitored during session    Home Living                          Prior Function            PT Goals (current goals can now be found in the care plan section) Acute Rehab PT Goals Patient Stated Goal: to improve pain and mobility Progress towards PT goals: Progressing toward goals    Frequency    Min 2X/week      PT Plan Current plan remains appropriate    Co-evaluation PT/OT/SLP Co-Evaluation/Treatment: Yes Reason for Co-Treatment: Complexity of the patient's impairments (multi-system involvement);For patient/therapist safety PT goals addressed during session: Mobility/safety with mobility;Balance        AM-PAC PT "6 Clicks" Mobility   Outcome Measure  Help needed turning from your back to your side while in a flat bed without using bedrails?: A Lot Help needed moving from lying on your back to sitting on the side of a flat bed without using bedrails?: Total Help needed moving to and from a bed to a chair (including a wheelchair)?: Total Help needed standing up from a chair using your arms (e.g., wheelchair or bedside chair)?: Total Help needed to walk in hospital room?: Total Help needed climbing 3-5 steps with a railing? : Total 6 Click Score: 7    End of Session Equipment Utilized During Treatment: Oxygen Activity Tolerance: Patient tolerated treatment well Patient left: in bed;with call bell/phone within reach;with bed alarm set Nurse Communication: Mobility status PT Visit Diagnosis: Other abnormalities of gait and mobility (R26.89);Muscle weakness (generalized) (M62.81);Pain Pain - Right/Left: Left Pain - part of body: Arm     Time: 6378-5885 PT Time  Calculation (min) (ACUTE ONLY): 24 min  Charges:  $Therapeutic Activity: 8-22 mins                    Mikel Cella, PTA    [name] 07/28/2022, 12:22 PM

## 2022-07-28 NOTE — Progress Notes (Signed)
PROGRESS NOTE    Carla [name]   OIN:[id number] DOB: 09-May-1961  DOA: 07/22/2022 Date of Service: 07/28/22 PCP: [facility] [place]     Brief Narrative / Hospital Course:  Carla [name] is a 61 y.o. female with medical history significant for peripheral arterial disease s/p right AKA, chronic diastolic dysfunction CHF, anxiety and depression, COPD, paroxysmal A-fib on chronic anticoagulation therapy, who presents to the ED with left leg pain for the past 1 week which had been progressively worsening.  09/30: CTA found with ischemic LLE. The patient was evaluated by vascular surgeon, Dr. Trula Slade in the ED who will take her to the OR on Sunday 10/1.  Patient started on heparin. Admitted to hospitalist service.  10/01: pt went to OR for common femoral artery bypass graft, post OR woke up c/o leg pain, mottled leg, went back to OR for thrombectomy of Left axillary femoral bypass graft with revision. Transferred to ICU.  10/02: Started on cefepime for concern for abscess of right BKA stump 10/03-10/04: Left upper extremity swelling noted overnight, ultrasound pending as of this AM --> no DVT but (+)hematoma. D/w vascular - no procedures planned for this (expected w/ anticoagulation) and ok to transfer to floor. Pain control has been a challenge but she is better w/ anxiolytics.  10/05: Afib w/ occasional rapid rate and hypotension.  Hemoglobin down to 6.2.  Transfused 2 units PRBC, VS stabilized.  10/06: Hemoglobin 9.0 posttransfusion, 7.5 this morning.  Follow H/H midday today Hgb 7.7, hold off on transfusion for now, CBC in AM. PT recommending SNF.    Consultants:  Vascular Surgery   Procedures: 10/01: Left axillary to common femoral artery bypass graft to treat ischemia 10/01: Thrombectomy of left axillary femoral bypass graft with revision  Antimicrobials:  Cefazolin x3 doses  Cefepime 10/2 - present    ASSESSMENT & PLAN:   Principal Problem:   Critical limb ischemia of  left lower extremity ([place]) Active Problems:   AKI (acute kidney injury) ([place])   PAF (paroxysmal atrial fibrillation) (HCC)   Chronic diastolic CHF (congestive heart failure) (HCC)   Abscess of knee, right   Hematoma   Critical limb ischemia of left lower extremity ([place]) Peripheral artery disease s/p right BKA S/p OR initially 10/01 for Left axillary to common femoral artery bypass graft, Prevena wound VAC placed .  Post surgery patient had left foot pain and mottled leg was taken to the OR again 10/01, thrombectomy of left axillofemoral bypass graft Continue on heparin per vascular surgery, home eliquis held (Hx Afib) Continuing aspirin, statin Pain control  Left upper extremity swelling Hematoma LUE Noted overnight 10/03-10/04 [place] LUE no DVT but there is large (6.6 x 6.3 x 6.5 cm (volume = 140 cm^3)) hematoma under the left axillary surgical site. D/w vascular - no procedures planned for this (expected w/ anticoagulation) and ok to transfer to floor Monitoring H/H see anemia  Acute blood loss anemia, surgical intervention, hematoma Status posttransfusion 2 units PRBC 07/27/2022 Serial H/H & CBC Transfuse for hemoglobin less than 7  Rt knee fluid collection concerning for abscess At site of amputation, found on CT concern for abscess Started on Cefepime Vascular following, may need incision/drainage   AKI - resolved Prerenal D/c'ed ivf Monitor BMP   Avoid nephrotoxic meds  PAF (paroxysmal atrial fibrillation) (HCC) Eliquis on hold for washout Continue metoprolol for rate control On Heparin for limb ischemia   Chronic diastolic CHF (congestive heart failure) (HCC) Clinically compensated Continue metoprolol and Imdur  DVT prophylaxis: n/a on heparin as above  Pertinent IV fluids/nutrition: full liquid diet to advance as tolerate  Central lines / invasive devices: Urethral catheter placed 07/23/2022  Code Status: FULL CODE Family Communication: daughter at bedside on  rounds   Disposition: remains inpatient  TOC needs: will need SNF Barriers to discharge / significant pending items: vascular clearance, IV antibiotics for now treating abscess, debility and needing SNF placement, anemia monitoring                Subjective:  Reports arm pain, no SOB, no palpations - reports tired but overall a bit better since transfusion, pain is stable.        Objective:  Vitals:   07/28/22 0404 07/28/22 0830 07/28/22 1035 07/28/22 1217  BP: (!) 106/56 (!) 96/52 (!) 110/55 (!) 96/53  Pulse: 75 73  77  Resp: '18 16  16  '$ Temp: 98.2 F (36.8 C) 97.8 F (36.6 C)  98.9 F (37.2 C)  TempSrc: Oral     SpO2: 100% 99%  100%  Weight:      Height:        Intake/Output Summary (Last 24 hours) at 07/28/2022 1330 Last data filed at 07/28/2022 1100 Gross per 24 hour  Intake 1494.03 ml  Output 3300 ml  Net -1805.97 ml   Filed Weights   07/22/22 0029  Weight: 80.7 kg    Examination:  Tele (+)Afib rapid rate  Constitutional:  VS as above General Appearance: alert, obese, ill appearing but NAD Respiratory: Normal respiratory effort No wheeze No rhonchi No rales Cardiovascular: Irreg irreg, reg rate  No rub/gallop auscultated Gastrointestinal: No tenderness Musculoskeletal:  No clubbing/cyanosis of digits Symmetrical movement in all extremities R BKA LUE (+)tenderness  INtact surgical site L upper chest but swollen, ecchymosis  Neurological: No cranial nerve deficit on limited exam Alert Psychiatric: Normal judgment/insight Depressed/anxious mood and affect       Scheduled Medications:   aspirin EC  81 mg Oral Q0600   atorvastatin  80 mg Oral QHS   Chlorhexidine Gluconate Cloth  6 each Topical Daily   citalopram  40 mg Oral Daily   docusate sodium  100 mg Oral Daily   insulin aspart  0-15 Units Subcutaneous Q4H   isosorbide mononitrate  60 mg Oral Daily   pantoprazole  40 mg Oral Daily   pregabalin  100 mg Oral TID     Continuous Infusions:  sodium chloride     ceFEPime (MAXIPIME) IV 2 g (07/28/22 1300)   heparin 1,400 Units/hr (07/28/22 0326)   magnesium sulfate bolus IVPB     sodium chloride Stopped (07/25/22 2101)    PRN Medications:  sodium chloride, acetaminophen **OR** acetaminophen, acetaminophen **OR** acetaminophen, alum & mag hydroxide-simeth, guaiFENesin-dextromethorphan, hydrALAZINE, HYDROcodone-acetaminophen, HYDROmorphone (DILAUDID) injection, labetalol, LORazepam, magnesium sulfate bolus IVPB, methocarbamol, metoprolol tartrate, ondansetron **OR** ondansetron (ZOFRAN) IV, ondansetron, mouth rinse, oxyCODONE, phenol, polyethylene glycol, potassium chloride  Antimicrobials:  Anti-infectives (From admission, onward)    Start     Dose/Rate Route Frequency Ordered Stop   07/27/22 1400  ceFEPIme (MAXIPIME) 2 g in sodium chloride 0.9 % 100 mL IVPB        2 g 200 mL/hr over 30 Minutes Intravenous Every 8 hours 07/27/22 1233     07/25/22 2200  ceFEPIme (MAXIPIME) 2 g in sodium chloride 0.9 % 100 mL IVPB  Status:  Discontinued        2 g 200 mL/hr over 30 Minutes Intravenous Every 12 hours 07/25/22 0730 07/27/22  1233   07/24/22 1200  ceFEPIme (MAXIPIME) 2 g in sodium chloride 0.9 % 100 mL IVPB  Status:  Discontinued        2 g 200 mL/hr over 30 Minutes Intravenous Every 8 hours 07/24/22 0957 07/25/22 0730   07/23/22 1630  ceFAZolin (ANCEF) IVPB 2g/100 mL premix        2 g 200 mL/hr over 30 Minutes Intravenous Every 8 hours 07/23/22 1227 07/23/22 2357       Data Reviewed: I have personally reviewed following labs and imaging studies  CBC: Recent Labs  Lab 07/22/22 0030 07/23/22 0030 07/24/22 0445 07/25/22 0355 07/26/22 0411 07/27/22 0359 07/27/22 2252 07/28/22 0629 07/28/22 1259  WBC 8.3   < > 19.0* 21.0* 24.5* 15.6*  --  14.1*  --   NEUTROABS 4.8  --   --   --   --   --   --   --   --   HGB 11.9*   < > 11.8* 12.0 8.3* 6.2* 9.0* 7.5* 7.7*  HCT 38.1   < > 37.8 37.8 26.7*  20.0* 28.5* 23.5* 24.2*  MCV 88.4   < > 86.7 87.1 89.0 91.3  --  89.4  --   PLT 237   < > 203 202 158 155  --  149*  --    < > = values in this interval not displayed.   Basic Metabolic Panel: Recent Labs  Lab 07/24/22 0445 07/25/22 0354 07/26/22 0411 07/27/22 0359 07/28/22 0629  NA 138 138 135 135 138  K 4.3 4.4 4.5 4.2 4.1  CL 110 111 110 108 109  CO2 20* 21* 20* 22 26  GLUCOSE 158* 172* 134* 115* 99  BUN 13 25* 31* 22 18  CREATININE 0.80 1.29* 1.03* 0.76 0.64  CALCIUM 8.3* 7.9* 7.7* 7.7* 7.9*   GFR: Estimated Creatinine Clearance: 71.7 mL/min (by C-G formula based on SCr of 0.64 mg/dL). Liver Function Tests: Recent Labs  Lab 07/22/22 0030  AST 13*  ALT 10  ALKPHOS 90  BILITOT 0.5  PROT 6.7  ALBUMIN 3.1*   No results for input(s): "LIPASE", "AMYLASE" in the last 168 hours. No results for input(s): "AMMONIA" in the last 168 hours. Coagulation Profile: Recent Labs  Lab 07/22/22 0030  INR 0.9   Cardiac Enzymes: No results for input(s): "CKTOTAL", "CKMB", "CKMBINDEX", "TROPONINI" in the last 168 hours. BNP (last 3 results) No results for input(s): "PROBNP" in the last 8760 hours. HbA1C: No results for input(s): "HGBA1C" in the last 72 hours.  CBG: Recent Labs  Lab 07/27/22 1940 07/28/22 0028 07/28/22 0406 07/28/22 0832 07/28/22 1217  GLUCAP 116* 136* 135* 105* 132*   Lipid Profile: No results for input(s): "CHOL", "HDL", "LDLCALC", "TRIG", "CHOLHDL", "LDLDIRECT" in the last 72 hours. Thyroid Function Tests: No results for input(s): "TSH", "T4TOTAL", "FREET4", "T3FREE", "THYROIDAB" in the last 72 hours. Anemia Panel: No results for input(s): "VITAMINB12", "FOLATE", "FERRITIN", "TIBC", "IRON", "RETICCTPCT" in the last 72 hours. Urine analysis: N/a Sepsis Labs: '@LABRCNTIP'$ (procalcitonin:4,lacticidven:4)  Recent Results (from the past 240 hour(s))  MRSA Next Gen by PCR, Nasal     Status: Abnormal   Collection Time: 07/23/22 12:17 AM   Specimen: Nasal  Mucosa; Nasal Swab  Result Value Ref Range Status   MRSA by PCR Next Gen DETECTED (A) NOT DETECTED Final    Comment: RESULT CALLED TO, READ BACK BY AND VERIFIED WITH: Clide Cliff @ [phone] on 07/23/2022 by CAF (NOTE) The GeneXpert MRSA Assay (FDA approved for NASAL  specimens only), is one component of a comprehensive MRSA colonization surveillance program. It is not intended to diagnose MRSA infection nor to guide or monitor treatment for MRSA infections. Test performance is not FDA approved in patients less than 13 years old. Performed at [facility], [address] [place], [place] [address]          Radiology Studies: CT ANGIO AO+BIFEM W & OR WO CONTRAST  Result Date: 07/22/2022 CLINICAL DATA:  Arterial embolism lower extremity EXAM: CT ANGIOGRAPHY OF ABDOMINAL AORTA WITH ILIOFEMORAL RUNOFF TECHNIQUE: Multidetector CT imaging of the abdomen, pelvis and lower extremities was performed using the standard protocol during bolus administration of intravenous contrast. Multiplanar CT image reconstructions and MIPs were obtained to evaluate the vascular anatomy. RADIATION DOSE REDUCTION: This exam was performed according to the departmental dose-optimization program which includes automated exposure control, adjustment of the mA and/or kV according to patient size and/or use of iterative reconstruction technique. CONTRAST:  17m OMNIPAQUE IOHEXOL 350 MG/ML SOLN COMPARISON:  None Available. CT abdomen and pelvis 03/21/2022 FINDINGS: VASCULAR Aorta: Aorto bi iliac stent is occluded. The aorta is otherwise normal in caliber. Celiac: Patent without dissection or aneurysm. SMA: The SMA stent is occluded at the origin. With distal reconstitution. Renals: Patent bilateral renal arteries without aneurysm or dissection. IMA: Not visualized and likely occluded. RIGHT Lower Extremity Inflow: The right common femoral artery stent is occluded with thrombus extending into the right external iliac  artery. The right internal iliac artery is likely occluded at its origin with distal reconstitution. Reconstitution of the distal external iliac artery. Outflow: The common femoral, profunda femoral, and superficial femoral arteries are patent and likely supplied from collateral flow. Runoff: Not applicable.  Right above the knee amputation. LEFT Lower Extremity Inflow: The common iliac stent external iliac, and proximal internal iliac are occluded. Distal reconstitution of the internal iliac. Outflow: Reconstitution of the common femoral artery. The common femoral, profunda femoral, superficial femoral, and popliteal arteries are patent. Runoff: Three-vessel runoff is patent to a 12-13 cm above the ankle in the calf. At this point the vessels are nonopacified possibly due to slow flow though occlusion is not excluded. Veins: No obvious venous abnormality within the limitations of this arterial phase study. Review of the MIP images confirms the above findings. NON-VASCULAR Lower chest: No acute abnormality. Hepatobiliary: Cholecystectomy. No suspicious focal hepatic lesion. No biliary dilation. Pancreas: Unremarkable. Spleen: Unremarkable. Adrenals/Urinary Tract: Adrenal glands are unremarkable. Kidneys are normal, without renal calculi, focal lesion, or hydronephrosis. Bladder is unremarkable. Stomach/Bowel: Distended stomach. Normal caliber large and small bowel. Colonic diverticulosis without diverticulitis. Herniation of small and large bowel into the large ventral abdominal wall hernia. No evidence of obstruction or strangulation. Bowel anastomosis is present within the hernia sac. Lymphatic: No suspicious adenopathy. Reproductive: Hysterectomy.  No adnexal masses. Other: No free intraperitoneal fluid or gas. Musculoskeletal: Within the distal right thigh at the site of amputation there is a 5.1 x 4.0 cm fluid collection with peripheral enhancement. Edema within the left foot. No acute osseous abnormality.  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 Patent three-vessel runoff of the left lower extremity to the level of the calf where there is non opacification of the distal lower extremity arteries. This may be due to slow flow however occlusion is not excluded. Occluded SMA stent at the origin with distal reconstitution. NON-VASCULAR Right above-the-knee amputation. At the site of amputation there is a fluid collection  concerning for abscess. Large ventral abdominal wall hernia containing nonobstructive loops of bowel. Emergent results were called by telephone at the time of interpretation on 07/22/2022 at 1:14 am to provider JADE SUNG , who verbally acknowledged these results. Electronically Signed   By: Placido Sou M.D.   On: 07/22/2022 01:27            LOS: 6 days       Emeterio Reeve, DO [facility] 07/28/2022, 1:30 PM   Staff may message me via secure chat in [place]  but this may not receive immediate response,  please page for urgent matters!  If 7PM-7AM, please contact night-coverage [email or web address]  Dictation software was used to generate the above note. Typos may occur and escape review, as with typed/written notes. Please contact Dr Sheppard Coil directly for clarity if needed.

## 2022-07-28 NOTE — Progress Notes (Signed)
Occupational Therapy Treatment Patient Details Name: Carla [name] MRN: [id number] DOB: 07-17-1961 Today's Date: 07/28/2022   History of present illness Pt is a 61 year old female s/p OR initially for Left axillary to common femoral artery bypass graft, Prevena wound VAC placed .  Post surgery patient had left foot pain and mottled leg was taken to the OR again , now status post thrombectomy of left axillofemoral bypass graft after presentingto the ED with progressively worsening left leg pain; PMH significant for arterial disease s/p right AKA, chronic diastolic dysfunction CHF, anxiety and depression, COPD, paroxysmal A-fib on chronic anticoagulation therapy   OT comments  Chart reviewed, co tx completed with PT on this date. Pt presents with improved cognition compared to evaluation however cognitive deficits continue to persist. Pt also premedicated prior to session. Tx session targeted improving activity tolerance in order to facilitate improved participation in ADL tasks. Pt tolerated seated edge of bed approx 8 minutes to participated in self care tasks, step by step vcs required for postioning, cervical neck extension. At least CGA-MIN A required for static sitting balance throughout. TOTAL A required for LB dressing, peri care.  Progress is noted in tolerance for edge of bed tasks, ADL task completion however pt continues to require significant assist for all. Pt is left as received, all needs met. Discharge recommendation remains appropriate. OT will continue to follow acutely.    Recommendations for follow up therapy are one component of a multi-disciplinary discharge planning process, led by the attending physician.  Recommendations may be updated based on patient status, additional functional criteria and insurance authorization.    Follow Up Recommendations  Skilled nursing-short term rehab (<3 hours/day)    Assistance Recommended at Discharge Frequent or constant Supervision/Assistance   Patient can return home with the following  Two people to help with walking and/or transfers;Two people to help with bathing/dressing/bathroom   Equipment Recommendations  Other (comment) (defer)    Recommendations for Other Services      Precautions / Restrictions Precautions Precautions: Fall Restrictions Weight Bearing Restrictions: No       Mobility Bed Mobility Overal bed mobility: Needs Assistance Bed Mobility: Supine to Sit, Sit to Supine, Rolling Rolling: Max assist   Supine to sit: Max assist, Total assist, +2 for physical assistance, HOB elevated Sit to supine: Max assist, Total assist, +2 for physical assistance, HOB elevated        Transfers                   General transfer comment: unsafe to attempt on this date     Balance Overall balance assessment: Needs assistance Sitting-balance support: Bilateral upper extremity supported, Single extremity supported Sitting balance-[name]: Fair Sitting balance - Comments: tolerated sitting edge of bed approx 8 minutes, CGA-MIN A, step by step vcs for cervical neck extension Postural control: Posterior lean                                 ADL either performed or assessed with clinical judgement   ADL Overall ADL's : Needs assistance/impaired     Grooming: Minimal assistance;Bed level;Wash/dry face       Lower Body Bathing: Maximal assistance   Upper Body Dressing : Moderate assistance;Sitting Upper Body Dressing Details (indicate cue type and reason): gown Lower Body Dressing: Total assistance;Bed level       Toileting- Clothing Manipulation and Hygiene: Maximal assistance;Bed level  Extremity/Trunk Assessment              Vision       Perception     Praxis      Cognition Arousal/Alertness: Awake/alert, Lethargic, Suspect due to medications Behavior During Therapy: WFL for tasks assessed/performed Overall Cognitive Status: No family/caregiver  present to determine baseline cognitive functioning Area of Impairment: Orientation, Attention, Awareness, Following commands, Safety/judgement, Problem solving                 Orientation Level: Disoriented to, Time Current Attention Level: Focused Memory: Decreased short-term memory Following Commands: Follows one step commands with increased time Safety/Judgement: Decreased awareness of safety, Decreased awareness of deficits Awareness: Intellectual Problem Solving: Slow processing, Decreased initiation, Difficulty sequencing, Requires verbal cues, Requires tactile cues          Exercises      Shoulder Instructions       General Comments LUE/LE cool to touch and edema noted throughout    Pertinent Vitals/ Pain       Pain Assessment Pain Assessment: 0-10 Pain Score: 9  Pain Location: L arm Pain Descriptors / Indicators: Discomfort, Grimacing Pain Intervention(s): Premedicated before session, Monitored during session, Repositioned  Home Living                                          Prior Functioning/Environment              Frequency  Min 2X/week        Progress Toward Goals  OT Goals(current goals can now be found in the care plan section)  Progress towards OT goals: Progressing toward goals     Plan Discharge plan remains appropriate    Co-evaluation    PT/OT/SLP Co-Evaluation/Treatment: Yes Reason for Co-Treatment: Complexity of the patient's impairments (multi-system involvement);Necessary to address cognition/behavior during functional activity;For patient/therapist safety PT goals addressed during session: Mobility/safety with mobility;Balance OT goals addressed during session: ADL's and self-care      AM-PAC OT "6 Clicks" Daily Activity     Outcome Measure   Help from another person eating meals?: A Little Help from another person taking care of personal grooming?: A Lot Help from another person toileting, which  includes using toliet, bedpan, or urinal?: Total Help from another person bathing (including washing, rinsing, drying)?: A Lot Help from another person to put on and taking off regular upper body clothing?: A Lot Help from another person to put on and taking off regular lower body clothing?: Total 6 Click Score: 11    End of Session    OT Visit Diagnosis: Muscle weakness (generalized) (M62.81);Unsteadiness on feet (R26.81)   Activity Tolerance Patient tolerated treatment well   Patient Left in bed;with call bell/phone within reach;with bed alarm set   Nurse Communication Mobility status        Time: 1131-1156 OT Time Calculation (min): 25 min  Charges: OT General Charges $OT Visit: 1 Visit OT Treatments $Self Care/Home Management : 8-22 mins  Shanon Payor, OTD OTR/L  07/28/22, 1:48 PM

## 2022-07-28 NOTE — Progress Notes (Signed)
ANTICOAGULATION CONSULT NOTE  Pharmacy Consult for Heparin infusion Indication: DVT/Ischemic leg   Allergies  Allergen Reactions   Gabapentin     Tremors    Bactrim [Sulfamethoxazole-Trimethoprim] Itching    Patient Measurements: Height: '5\' 2"'$  (157.5 cm) Weight: 80.7 kg (178 lb) IBW/kg (Calculated) : 50.1 Heparin Dosing Weight: 68.1 kg   Vital Signs: Temp: 98.2 F (36.8 C) (10/06 0404) Temp Source: Oral (10/06 0404) BP: 106/56 (10/06 0404) Pulse Rate: 75 (10/06 0404)  Labs: Recent Labs    07/25/22 1309 07/25/22 2207 07/26/22 0411 07/26/22 1339 07/27/22 0359 07/27/22 2252 07/28/22 0629  HGB  --    < > 8.3*  --  6.2* 9.0* 7.5*  HCT  --    < > 26.7*  --  20.0* 28.5* 23.5*  PLT  --   --  158  --  155  --  149*  APTT 127*  --   --   --   --   --   --   HEPARINUNFRC 1.02*   < > 0.70 0.63 0.66  --  0.55  CREATININE  --   --  1.03*  --  0.76  --  0.64   < > = values in this interval not displayed.     Estimated Creatinine Clearance: 71.7 mL/min (by C-G formula based on SCr of 0.64 mg/dL).   Medical History: Past Medical History:  Diagnosis Date   Abdominal aortic atherosclerosis ([place])    a. 05/2017 CTA abd/pelvis: significant atherosclerotic dzs of the infrarenal abd Ao w/ some mural thrombus. No aneurysm or dissection.   Acute focal ischemia of small intestine (HCC)    Acute right-sided low back pain with right-sided sciatica 06/13/2017   AKI (acute kidney injury) ([place]) 07/29/2017   Anemia    Anginal pain ([place])    a. 08/2012 Lexiscan MV: EF 54%, non ischemia/infarct.   Anxiety and depression    Baker's cyst of knee, right    a. 07/2016 U/S: 4.1 x 1.4 x 2.9 cystic structure in R poplitetal fossa.   Bell's palsy    CAD (coronary artery disease)    CHF (congestive heart failure) ([place])    a.) TTE 06/20/2017: EF 60-65%, no rwma, G1DD, no source of cardiac emboli.   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 05/2017 CTA Abd/pelvis: apparent thrombus or embolus in prox SMA (70-90%); b. 05/2017 catheter directed tPA, mechanical thrombectomy, and stenting of the SMA.   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 b.) rate/rhythm maintained on oral metoprolol tartrate;  chronically anticoagulated with standard dose apixaban.   Palpitations    Peripheral edema    Personal history of other malignant neoplasm of skin 06/01/2016   Pneumonia    Pre-diabetes    Primary osteoarthritis of both knees 06/13/2017   SBO (small bowel obstruction) ([place]) 08/06/2017   Skin cancer of nose    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HCC)    Vitamin D deficiency     Medications:  Medications Prior to Admission  Medication Sig Dispense Refill Last Dose   amLODipine (NORVASC) 5 MG tablet Take 5 mg by mouth daily.   07/21/2022 at 0800   apixaban (ELIQUIS) 5 MG TABS tablet Take 1 tablet (5 mg total) by mouth 2 (two) times daily. 60 tablet 11 07/21/2022 at 0800   aspirin EC 81 MG tablet Take 81 mg by mouth daily. Swallow whole.   07/21/2022 at 0800   atorvastatin (LIPITOR) 80 MG tablet Take 80 mg by  mouth at bedtime.   07/21/2022 at 2000   citalopram (CELEXA) 40 MG tablet Take 1 tablet (40 mg total) by mouth daily. 30 tablet 2 07/21/2022 at 0800   COMBIVENT RESPIMAT 20-100 MCG/ACT AERS respimat    prn at prn   esomeprazole (NEXIUM) 20 MG capsule Take 20 mg by mouth daily.   07/21/2022 at 0800   ferrous sulfate 325 (65 FE) MG tablet Take 325 mg by mouth daily.   07/21/2022 at 0800   furosemide (LASIX) 40 MG tablet Take 60 mg by mouth daily.   07/21/2022 at 0800   isosorbide mononitrate (IMDUR) 60 MG 24 hr tablet Take 60 mg by mouth daily.   07/21/2022 at 0800   metoprolol tartrate (LOPRESSOR) 50 MG tablet Take 50 mg by mouth 2 (two) times daily.   07/21/2022 at 2000   Multiple Vitamin (MULTIVITAMIN WITH MINERALS) TABS tablet Take 1 tablet by mouth daily. 30 tablet 0 07/21/2022 at 0800   omeprazole (PRILOSEC) 20 MG capsule Take 20 mg by mouth 2 (two) times daily before a meal.   07/21/2022 at 2000   ondansetron (ZOFRAN) 4 MG tablet Take 4 mg by mouth every 8 (eight) hours as needed for nausea or vomiting.   prn at prn   oxyCODONE-acetaminophen (PERCOCET/ROXICET) 5-325 MG tablet Take 1 tablet by mouth every 6 (six) hours as needed for moderate pain. 40 tablet 0 prn at prn   pregabalin (LYRICA) 100 MG capsule Take 1 capsule (100 mg total) by mouth 3 (three) times daily. 90 capsule 3 07/21/2022 at 2000   feeding supplement (ENSURE ENLIVE / ENSURE PLUS) LIQD Take 237 mLs by mouth 2 (two) times daily between meals. 237 mL 12     Assessment: Pharmacy consulted to dose heparin in this 61 year old female admitted with ischemic leg.   Pt reports taking Eliquis 5 mg PO BID PTA with last dose on 9/29 @ 2000.    Documentation:  Critically elevated aPTT at 162 with correlating anti-xa level of >1.10 s/p thrombectomy on 10/1 at 1600. Vascular believes this may be secondary to heparin 8,000 unit bolus given at 1400 during procedure. Heparin infusion rate was increased from 1400 to 1800 units/hr. Instructed by vascular  surgeon to continue heparin infusion rate as is. Vascular also requests that we refrain from holding infusion. Goal heparin level increased to 0.5 - 1.0 units/mL. If heparin level supratherapeutic, only decrease rate.  10/6: Plt reman stable. Noted slight drop in hemoglobin after 1 unit PRBC given on 10/5. Vascula surgeon will like to keep  patient on anticoagulation due to severe hypercoagulable state.   Goal of Therapy:  Heparin level 0.5 - 1.0 units/mL per vascular   Monitor platelets by anticoagulation protocol: Yes  Date/time HL/aPTT Comments 10/4'@1339'$  HL= 0.63 Therapeutic @ 1400 un/hr 10/5'@0359'$  HL = 0.66 Therapeutic @ 1400 un/hr 10/6'@0629'$  HL = 0.55 Therapeutic  Plan:  --Continue heparin rate to 1400 units/hr and follow up for s/sx of bleeding --Recheck next heparin level in am  --daily CBC while on IV heparin  Dejai Schubach Rodriguez-Guzman PharmD, BCPS 07/28/2022 7:38 AM

## 2022-07-28 NOTE — Care Management Important Message (Signed)
Important Message  Patient Details  Name: Carla [name] MRN: [id number] Date of Birth: [birth year]   Medicare Important Message Given:  Yes     Dannette Barbara 07/28/2022, 1:49 PM

## 2022-07-29 DIAGNOSIS — I5032 Chronic diastolic (congestive) heart failure: Secondary | ICD-10-CM | POA: Diagnosis not present

## 2022-07-29 DIAGNOSIS — N179 Acute kidney failure, unspecified: Secondary | ICD-10-CM | POA: Diagnosis not present

## 2022-07-29 DIAGNOSIS — I480 Paroxysmal atrial fibrillation: Secondary | ICD-10-CM | POA: Diagnosis not present

## 2022-07-29 DIAGNOSIS — I70222 Atherosclerosis of native arteries of extremities with rest pain, left leg: Secondary | ICD-10-CM | POA: Diagnosis not present

## 2022-07-29 LAB — CBC
HCT: 22.4 % — ABNORMAL LOW (ref 36.0–46.0)
Hemoglobin: 7 g/dL — ABNORMAL LOW (ref 12.0–15.0)
MCH: 28.6 pg (ref 26.0–34.0)
MCHC: 31.3 g/dL (ref 30.0–36.0)
MCV: 91.4 fL (ref 80.0–100.0)
Platelets: 155 10*3/uL (ref 150–400)
RBC: 2.45 MIL/uL — ABNORMAL LOW (ref 3.87–5.11)
RDW: 17.8 % — ABNORMAL HIGH (ref 11.5–15.5)
WBC: 12.8 10*3/uL — ABNORMAL HIGH (ref 4.0–10.5)
nRBC: 0.6 % — ABNORMAL HIGH (ref 0.0–0.2)

## 2022-07-29 LAB — GLUCOSE, CAPILLARY
Glucose-Capillary: 123 mg/dL — ABNORMAL HIGH (ref 70–99)
Glucose-Capillary: 157 mg/dL — ABNORMAL HIGH (ref 70–99)
Glucose-Capillary: 114 mg/dL — ABNORMAL HIGH (ref 70–99)
Glucose-Capillary: 125 mg/dL — ABNORMAL HIGH (ref 70–99)
Glucose-Capillary: 120 mg/dL — ABNORMAL HIGH (ref 70–99)
Glucose-Capillary: 124 mg/dL — ABNORMAL HIGH (ref 70–99)

## 2022-07-29 LAB — HEMOGLOBIN AND HEMATOCRIT, BLOOD
HCT: 22.2 % — ABNORMAL LOW (ref 36.0–46.0)
Hemoglobin: 6.9 g/dL — ABNORMAL LOW (ref 12.0–15.0)

## 2022-07-29 LAB — PREPARE RBC (CROSSMATCH)

## 2022-07-29 LAB — HEPARIN LEVEL (UNFRACTIONATED)
Heparin Unfractionated: 0.5 IU/mL (ref 0.30–0.70)
Heparin Unfractionated: 0.54 IU/mL (ref 0.30–0.70)

## 2022-07-29 MED ORDER — SODIUM CHLORIDE 0.9% IV SOLUTION: Freq: Once | INTRAVENOUS | Status: DC

## 2022-07-29 MED ORDER — HEPARIN BOLUS VIA INFUSION
1000.0000 [IU] | Freq: Once | INTRAVENOUS | Status: AC
  Administered 2022-07-29: 1000 [IU] via INTRAVENOUS
  Filled 2022-07-29: qty 1000

## 2022-07-29 NOTE — Progress Notes (Signed)
ANTICOAGULATION CONSULT NOTE  Pharmacy Consult for Heparin infusion Indication: DVT/Ischemic leg   Allergies  Allergen Reactions   Gabapentin     Tremors    Bactrim [Sulfamethoxazole-Trimethoprim] Itching    Patient Measurements: Height: '5\' 2"'$  (157.5 cm) Weight: 80.7 kg (178 lb) IBW/kg (Calculated) : 50.1 Heparin Dosing Weight: 68.1 kg   Vital Signs: Temp: 98.2 F (36.8 C) (10/07 0059) Temp Source: Oral (10/07 0059) BP: 112/59 (10/07 0059) Pulse Rate: 82 (10/07 0059)  Labs: Recent Labs    07/26/22 1339 07/27/22 0359 07/27/22 0359 07/27/22 2252 07/28/22 0629 07/28/22 1259  HGB  --  6.2*   < > 9.0* 7.5* 7.7*  HCT  --  20.0*   < > 28.5* 23.5* 24.2*  PLT  --  155  --   --  149*  --   HEPARINUNFRC 0.63 0.66  --   --  0.55  --   CREATININE  --  0.76  --   --  0.64  --    < > = values in this interval not displayed.     Estimated Creatinine Clearance: 71.7 mL/min (by C-G formula based on SCr of 0.64 mg/dL).   Medical History: Past Medical History:  Diagnosis Date   Abdominal aortic atherosclerosis ([place])    a. 05/2017 CTA abd/pelvis: significant atherosclerotic dzs of the infrarenal abd Ao w/ some mural thrombus. No aneurysm or dissection.   Acute focal ischemia of small intestine (HCC)    Acute right-sided low back pain with right-sided sciatica 06/13/2017   AKI (acute kidney injury) ([place]) 07/29/2017   Anemia    Anginal pain ([place])    a. 08/2012 Lexiscan MV: EF 54%, non ischemia/infarct.   Anxiety and depression    Baker's cyst of knee, right    a. 07/2016 U/S: 4.1 x 1.4 x 2.9 cystic structure in R poplitetal fossa.   Bell's palsy    CAD (coronary artery disease)    CHF (congestive heart failure) ([place])    a.) TTE 06/20/2017: EF 60-65%, no rwma, G1DD, no source of cardiac emboli.   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 05/2017 CTA Abd/pelvis: apparent thrombus or embolus in prox SMA (70-90%); b. 05/2017 catheter directed tPA, mechanical thrombectomy, and stenting of the SMA.   Essential hypertension with goal blood pressure less than 140/90 01/19/2016   Gastroesophageal reflux disease 01/19/2016   GERD (gastroesophageal reflux disease)    H/O colectomy    History of 2019 novel coronavirus disease (COVID-19) 11/14/2019   History of kidney stones    Hyperlipidemia    Hypertension    Morbid obesity with BMI of 40.0-44.9, adult ([place])    NSTEMI (non-ST elevated myocardial infarction) ([place]) 06/27/2021   a.) high sensitivity troponins trended: 893 --> 1587 --> 1748 --> 868 --> 735 ng/L   Occlusive mesenteric ischemia ([place]) 06/19/2017   Osteoarthritis    PAD (peripheral artery disease) (HCC)    PAF (paroxysmal atrial fibrillation) (HCC)    a.) CHA2DS2-VASc = 6 (sex, CHF, HTN, DVT x2, aortic plaque). b.) rate/rhythm maintained on oral metoprolol tartrate; chronically anticoagulated with standard dose apixaban.   Palpitations    Peripheral edema    Personal history of other malignant neoplasm of skin 06/01/2016   Pneumonia  Pre-diabetes    Primary osteoarthritis of both knees 06/13/2017   SBO (small bowel obstruction) ([place]) 08/06/2017   Skin cancer of nose    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HCC)    Vitamin D deficiency     Medications:  Medications Prior to Admission  Medication Sig Dispense Refill Last Dose   amLODipine (NORVASC) 5 MG tablet Take 5 mg by mouth daily.   07/21/2022 at 0800   apixaban (ELIQUIS) 5 MG TABS tablet Take 1 tablet (5 mg total) by mouth 2 (two) times daily. 60 tablet 11 07/21/2022 at 0800   aspirin EC 81 MG tablet Take 81 mg by mouth daily. Swallow whole.   07/21/2022 at 0800   atorvastatin (LIPITOR) 80 MG tablet Take 80 mg by mouth at bedtime.   07/21/2022 at 2000   citalopram (CELEXA) 40 MG tablet Take 1  tablet (40 mg total) by mouth daily. 30 tablet 2 07/21/2022 at 0800   COMBIVENT RESPIMAT 20-100 MCG/ACT AERS respimat    prn at prn   esomeprazole (NEXIUM) 20 MG capsule Take 20 mg by mouth daily.   07/21/2022 at 0800   ferrous sulfate 325 (65 FE) MG tablet Take 325 mg by mouth daily.   07/21/2022 at 0800   furosemide (LASIX) 40 MG tablet Take 60 mg by mouth daily.   07/21/2022 at 0800   isosorbide mononitrate (IMDUR) 60 MG 24 hr tablet Take 60 mg by mouth daily.   07/21/2022 at 0800   metoprolol tartrate (LOPRESSOR) 50 MG tablet Take 50 mg by mouth 2 (two) times daily.   07/21/2022 at 2000   Multiple Vitamin (MULTIVITAMIN WITH MINERALS) TABS tablet Take 1 tablet by mouth daily. 30 tablet 0 07/21/2022 at 0800   omeprazole (PRILOSEC) 20 MG capsule Take 20 mg by mouth 2 (two) times daily before a meal.   07/21/2022 at 2000   ondansetron (ZOFRAN) 4 MG tablet Take 4 mg by mouth every 8 (eight) hours as needed for nausea or vomiting.   prn at prn   oxyCODONE-acetaminophen (PERCOCET/ROXICET) 5-325 MG tablet Take 1 tablet by mouth every 6 (six) hours as needed for moderate pain. 40 tablet 0 prn at prn   pregabalin (LYRICA) 100 MG capsule Take 1 capsule (100 mg total) by mouth 3 (three) times daily. 90 capsule 3 07/21/2022 at 2000   feeding supplement (ENSURE ENLIVE / ENSURE PLUS) LIQD Take 237 mLs by mouth 2 (two) times daily between meals. 237 mL 12     Assessment: Pharmacy consulted to dose heparin in this 61 year old female admitted with ischemic leg.   Pt reports taking Eliquis 5 mg PO BID PTA with last dose on 9/29 @ 2000.    Documentation:  Critically elevated aPTT at 162 with correlating anti-xa level of >1.10 s/p thrombectomy on 10/1 at 1600. Vascular believes this may be secondary to heparin 8,000 unit bolus given at 1400 during procedure. Heparin infusion rate was increased from 1400 to 1800 units/hr. Instructed by vascular surgeon to continue heparin infusion rate as is. Vascular also requests that we  refrain from holding infusion. Goal heparin level increased to 0.5 - 1.0 units/mL. If heparin level supratherapeutic, only decrease rate.  10/6: Plt reman stable. Noted slight drop in hemoglobin after 1 unit PRBC given on 10/5. Vascula surgeon will like to keep patient on anticoagulation due to severe hypercoagulable state.   Goal of Therapy:  Heparin level 0.7 - 1.0 units/mL per vascular   Monitor platelets by anticoagulation protocol: Yes  Date/time  HL/aPTT Comments 10/4'@1339'$  HL= 0.63 Therapeutic @ 1400 un/hr 10/5'@0359'$  HL = 0.66 Therapeutic @ 1400 un/hr 10/6'@0629'$  HL = 0.55 Therapeutic  Plan:  Pharmacy contacted by vascular provider. Ordered STAT HL following by 1000 unit bolus Narrow therapeutic range to 0.7 - 1.0 CBC daily  Renda Rolls, PharmD, [facility] 07/29/2022 4:46 AM

## 2022-07-29 NOTE — Progress Notes (Signed)
PROGRESS NOTE    Carla [name]   EQA:[id number] DOB: 1961/08/29  DOA: 07/22/2022 Date of Service: 07/29/22 PCP: [facility] [place]     Brief Narrative / Hospital Course:  Carla [name] is a 61 y.o. female with medical history significant for peripheral arterial disease s/p right AKA, chronic diastolic dysfunction CHF, anxiety and depression, COPD, paroxysmal A-fib on chronic anticoagulation therapy, who presents to the ED with left leg pain for the past 1 week which had been progressively worsening.  09/30: CTA found with ischemic LLE. The patient was evaluated by vascular surgeon, Dr. Trula Slade in the ED who will take her to the OR on Sunday 10/1.  Patient started on heparin. Admitted to hospitalist service.  10/01: pt went to OR for common femoral artery bypass graft, post OR woke up c/o leg pain, mottled leg, went back to OR for thrombectomy of Left axillary femoral bypass graft with revision. Transferred to ICU.  10/02: Started on cefepime for concern for abscess of right BKA stump 10/03-10/04: Left upper extremity swelling noted overnight, ultrasound pending as of this AM --> no DVT but (+)hematoma. D/w vascular - no procedures planned for this (expected w/ anticoagulation) and ok to transfer to floor. Pain control has been a challenge but she is better w/ anxiolytics.  10/05: Afib w/ occasional rapid rate and hypotension.  Hemoglobin down to 6.2.  Transfused 2 units PRBC, VS stabilized.  10/06: Hemoglobin 9.0 posttransfusion, 7.5 this morning.  Follow H/H midday today Hgb 7.7, hold off on transfusion for now, CBC in AM. PT recommending SNF. 10/07: concern for thrombosis to toes, vascular following w/ pharmacy for heparin management. Transfusing another unit blood for Hgb 7.0     Consultants:  Vascular Surgery   Procedures: 10/01: Left axillary to common femoral artery bypass graft to treat ischemia 10/01: Thrombectomy of left axillary femoral bypass graft with  revision  Antimicrobials:  Cefazolin x3 doses  Cefepime 10/2 - present    ASSESSMENT & PLAN:   Principal Problem:   Critical limb ischemia of left lower extremity (HCC) Active Problems:   AKI (acute kidney injury) ([place])   PAF (paroxysmal atrial fibrillation) (HCC)   Chronic diastolic CHF (congestive heart failure) (HCC)   Abscess of knee, right   Hematoma   Critical limb ischemia of left lower extremity ([place]) Peripheral artery disease s/p right BKA S/p OR initially 10/01 for Left axillary to common femoral artery bypass graft, Prevena wound VAC placed .  Post surgery patient had left foot pain and mottled leg was taken to the OR again 10/01, thrombectomy of left axillofemoral bypass graft Continue on heparin per vascular surgery, home eliquis held (Hx Afib) Continuing aspirin, statin Pain control  Left upper extremity swelling Hematoma LUE Noted overnight 10/03-10/04 [place] LUE no DVT but there is large (6.6 x 6.3 x 6.5 cm (volume = 140 cm^3)) hematoma under the left axillary surgical site. D/w vascular - no procedures planned for this (expected w/ anticoagulation) and ok to transfer to floor Monitoring H/H see anemia  Acute blood loss anemia, surgical intervention, hematoma Status posttransfusion 2 units PRBC 07/27/2022, 1 unit PRBC 07/29/22  Serial H/H & CBC  Rt knee fluid collection concerning for abscess At site of amputation, found on CT concern for abscess Started on Cefepime Vascular following, may need incision/drainage   AKI - resolved Prerenal D/c'ed ivf Monitor BMP   Avoid nephrotoxic meds  PAF (paroxysmal atrial fibrillation) (HCC) Eliquis on hold for washout Continue metoprolol for rate control  On Heparin for limb ischemia   Chronic diastolic CHF (congestive heart failure) (HCC) Clinically compensated Continue metoprolol and Imdur    DVT prophylaxis: n/a on heparin as above  Pertinent IV fluids/nutrition: full liquid diet to advance as tolerate   Central lines / invasive devices: none  Code Status: FULL CODE Family Communication: none at this time   Disposition: remains inpatient  TOC needs: will need SNF Barriers to discharge / significant pending items: vascular clearance, IV antibiotics for now treating abscess, debility and needing SNF placement, anemia monitoring, on heparin, will be here at least few more days                Subjective:  Still significant LUE pain, feeling weak. No CP/SOB.        Objective:  Vitals:   07/29/22 0059 07/29/22 0500 07/29/22 0729 07/29/22 1138  BP: (!) 112/59 (!) 114/53 (!) 136/54 (!) 105/55  Pulse: 82 72 73 89  Resp: '19 20 20 18  '$ Temp: 98.2 F (36.8 C) 97.9 F (36.6 C) 97.9 F (36.6 C) 98.9 F (37.2 C)  TempSrc: Oral Oral  Oral  SpO2: 100% 99% 100% 98%  Weight:      Height:        Intake/Output Summary (Last 24 hours) at 07/29/2022 1413 Last data filed at 07/29/2022 1345 Gross per 24 hour  Intake 500 ml  Output 2350 ml  Net -1850 ml   Filed Weights   07/22/22 0029  Weight: 80.7 kg    Examination:  Constitutional:  VS as above General Appearance: alert, obese, ill appearing but NAD Respiratory: Normal respiratory effort No wheeze No rhonchi No rales Cardiovascular: Irreg irreg, reg rate  No rub/gallop auscultated Gastrointestinal: No tenderness Musculoskeletal:  No clubbing/cyanosis of digits Symmetrical movement in all extremities R BKA LUE (+)tenderness  INtact surgical site L upper chest but swollen, ecchymosis  Neurological: No cranial nerve deficit on limited exam Alert Psychiatric: Normal judgment/insight Depressed/anxious mood and affect       Scheduled Medications:   sodium chloride   Intravenous Once   aspirin EC  81 mg Oral Q0600   atorvastatin  80 mg Oral QHS   Chlorhexidine Gluconate Cloth  6 each Topical Daily   citalopram  40 mg Oral Daily   docusate sodium  100 mg Oral Daily   insulin aspart  0-15 Units  Subcutaneous Q4H   isosorbide mononitrate  60 mg Oral Daily   pantoprazole  40 mg Oral Daily   pregabalin  100 mg Oral TID    Continuous Infusions:  sodium chloride     ceFEPime (MAXIPIME) IV 2 g (07/29/22 1354)   heparin 1,550 Units/hr (07/29/22 1352)   magnesium sulfate bolus IVPB     sodium chloride Stopped (07/25/22 2101)    PRN Medications:  sodium chloride, acetaminophen **OR** acetaminophen, acetaminophen **OR** acetaminophen, alum & mag hydroxide-simeth, guaiFENesin-dextromethorphan, hydrALAZINE, HYDROcodone-acetaminophen, HYDROmorphone (DILAUDID) injection, labetalol, LORazepam, magnesium sulfate bolus IVPB, methocarbamol, metoprolol tartrate, ondansetron **OR** ondansetron (ZOFRAN) IV, ondansetron, mouth rinse, oxyCODONE, phenol, polyethylene glycol, potassium chloride  Antimicrobials:  Anti-infectives (From admission, onward)    Start     Dose/Rate Route Frequency Ordered Stop   07/27/22 1400  ceFEPIme (MAXIPIME) 2 g in sodium chloride 0.9 % 100 mL IVPB        2 g 200 mL/hr over 30 Minutes Intravenous Every 8 hours 07/27/22 1233     07/25/22 2200  ceFEPIme (MAXIPIME) 2 g in sodium chloride 0.9 % 100 mL IVPB  Status:  Discontinued        2 g 200 mL/hr over 30 Minutes Intravenous Every 12 hours 07/25/22 0730 07/27/22 1233   07/24/22 1200  ceFEPIme (MAXIPIME) 2 g in sodium chloride 0.9 % 100 mL IVPB  Status:  Discontinued        2 g 200 mL/hr over 30 Minutes Intravenous Every 8 hours 07/24/22 0957 07/25/22 0730   07/23/22 1630  ceFAZolin (ANCEF) IVPB 2g/100 mL premix        2 g 200 mL/hr over 30 Minutes Intravenous Every 8 hours 07/23/22 1227 07/23/22 2357       Data Reviewed: I have personally reviewed following labs and imaging studies  CBC: Recent Labs  Lab 07/25/22 0355 07/26/22 0411 07/27/22 0359 07/27/22 2252 07/28/22 0629 07/28/22 1259 07/29/22 0501  WBC 21.0* 24.5* 15.6*  --  14.1*  --  12.8*  HGB 12.0 8.3* 6.2* 9.0* 7.5* 7.7* 7.0*  HCT 37.8 26.7*  20.0* 28.5* 23.5* 24.2* 22.4*  MCV 87.1 89.0 91.3  --  89.4  --  91.4  PLT 202 158 155  --  149*  --  774   Basic Metabolic Panel: Recent Labs  Lab 07/24/22 0445 07/25/22 0354 07/26/22 0411 07/27/22 0359 07/28/22 0629  NA 138 138 135 135 138  K 4.3 4.4 4.5 4.2 4.1  CL 110 111 110 108 109  CO2 20* 21* 20* 22 26  GLUCOSE 158* 172* 134* 115* 99  BUN 13 25* 31* 22 18  CREATININE 0.80 1.29* 1.03* 0.76 0.64  CALCIUM 8.3* 7.9* 7.7* 7.7* 7.9*   GFR: Estimated Creatinine Clearance: 71.7 mL/min (by C-G formula based on SCr of 0.64 mg/dL). Liver Function Tests: No results for input(s): "AST", "ALT", "ALKPHOS", "BILITOT", "PROT", "ALBUMIN" in the last 168 hours.  No results for input(s): "LIPASE", "AMYLASE" in the last 168 hours. No results for input(s): "AMMONIA" in the last 168 hours. Coagulation Profile: No results for input(s): "INR", "PROTIME" in the last 168 hours.  Cardiac Enzymes: No results for input(s): "CKTOTAL", "CKMB", "CKMBINDEX", "TROPONINI" in the last 168 hours. BNP (last 3 results) No results for input(s): "PROBNP" in the last 8760 hours. HbA1C: No results for input(s): "HGBA1C" in the last 72 hours.  CBG: Recent Labs  Lab 07/28/22 2024 07/29/22 0108 07/29/22 0456 07/29/22 0728 07/29/22 1143  GLUCAP 143* 123* 157* 114* 125*   Lipid Profile: No results for input(s): "CHOL", "HDL", "LDLCALC", "TRIG", "CHOLHDL", "LDLDIRECT" in the last 72 hours. Thyroid Function Tests: No results for input(s): "TSH", "T4TOTAL", "FREET4", "T3FREE", "THYROIDAB" in the last 72 hours. Anemia Panel: No results for input(s): "VITAMINB12", "FOLATE", "FERRITIN", "TIBC", "IRON", "RETICCTPCT" in the last 72 hours. Urine analysis: N/a Sepsis Labs: '@LABRCNTIP'$ (procalcitonin:4,lacticidven:4)  Recent Results (from the past 240 hour(s))  MRSA Next Gen by PCR, Nasal     Status: Abnormal   Collection Time: 07/23/22 12:17 AM   Specimen: Nasal Mucosa; Nasal Swab  Result Value Ref Range  Status   MRSA by PCR Next Gen DETECTED (A) NOT DETECTED Final    Comment: RESULT CALLED TO, READ BACK BY AND VERIFIED WITH: Clide Cliff @ 1287 on 07/23/2022 by CAF (NOTE) The GeneXpert MRSA Assay (FDA approved for NASAL specimens only), is one component of a comprehensive MRSA colonization surveillance program. It is not intended to diagnose MRSA infection nor to guide or monitor treatment for MRSA infections. Test performance is not FDA approved in patients less than 25 years old. Performed at [facility], [address], [place], [place] [address]  Radiology Studies: CT ANGIO AO+BIFEM W & OR WO CONTRAST  Result Date: 07/22/2022 CLINICAL DATA:  Arterial embolism lower extremity EXAM: CT ANGIOGRAPHY OF ABDOMINAL AORTA WITH ILIOFEMORAL RUNOFF TECHNIQUE: Multidetector CT imaging of the abdomen, pelvis and lower extremities was performed using the standard protocol during bolus administration of intravenous contrast. Multiplanar CT image reconstructions and MIPs were obtained to evaluate the vascular anatomy. RADIATION DOSE REDUCTION: This exam was performed according to the departmental dose-optimization program which includes automated exposure control, adjustment of the mA and/or kV according to patient size and/or use of iterative reconstruction technique. CONTRAST:  129m OMNIPAQUE IOHEXOL 350 MG/ML SOLN COMPARISON:  None Available. CT abdomen and pelvis 03/21/2022 FINDINGS: VASCULAR Aorta: Aorto bi iliac stent is occluded. The aorta is otherwise normal in caliber. Celiac: Patent without dissection or aneurysm. SMA: The SMA stent is occluded at the origin. With distal reconstitution. Renals: Patent bilateral renal arteries without aneurysm or dissection. IMA: Not visualized and likely occluded. RIGHT Lower Extremity Inflow: The right common femoral artery stent is occluded with thrombus extending into the right external iliac artery. The right internal iliac artery is  likely occluded at its origin with distal reconstitution. Reconstitution of the distal external iliac artery. Outflow: The common femoral, profunda femoral, and superficial femoral arteries are patent and likely supplied from collateral flow. Runoff: Not applicable.  Right above the knee amputation. LEFT Lower Extremity Inflow: The common iliac stent external iliac, and proximal internal iliac are occluded. Distal reconstitution of the internal iliac. Outflow: Reconstitution of the common femoral artery. The common femoral, profunda femoral, superficial femoral, and popliteal arteries are patent. Runoff: Three-vessel runoff is patent to a 12-13 cm above the ankle in the calf. At this point the vessels are nonopacified possibly due to slow flow though occlusion is not excluded. Veins: No obvious venous abnormality within the limitations of this arterial phase study. Review of the MIP images confirms the above findings. NON-VASCULAR Lower chest: No acute abnormality. Hepatobiliary: Cholecystectomy. No suspicious focal hepatic lesion. No biliary dilation. Pancreas: Unremarkable. Spleen: Unremarkable. Adrenals/Urinary Tract: Adrenal glands are unremarkable. Kidneys are normal, without renal calculi, focal lesion, or hydronephrosis. Bladder is unremarkable. Stomach/Bowel: Distended stomach. Normal caliber large and small bowel. Colonic diverticulosis without diverticulitis. Herniation of small and large bowel into the large ventral abdominal wall hernia. No evidence of obstruction or strangulation. Bowel anastomosis is present within the hernia sac. Lymphatic: No suspicious adenopathy. Reproductive: Hysterectomy.  No adnexal masses. Other: No free intraperitoneal fluid or gas. Musculoskeletal: Within the distal right thigh at the site of amputation there is a 5.1 x 4.0 cm fluid collection with peripheral enhancement. Edema within the left foot. No acute osseous abnormality. IMPRESSION: VASCULAR Occlusion of the aorto  bi-iliac stents with distal reconstitution of the external iliac/common femoral arteries. Patent three-vessel runoff of the left lower extremity to the level of the calf where there is non opacification of the distal lower extremity arteries. This may be due to slow flow however occlusion is not excluded. Occluded SMA stent at the origin with distal reconstitution. NON-VASCULAR Right above-the-knee amputation. At the site of amputation there is a fluid collection concerning for abscess. Large ventral abdominal wall hernia containing nonobstructive loops of bowel. Emergent results were called by telephone at the time of interpretation on 07/22/2022 at 1:14 am to provider JADE SUNG , who verbally acknowledged these results. Electronically Signed   By: TPlacido SouM.D.   On: 07/22/2022 01:27  LOS: 7 days       Emeterio Reeve, DO [facility] 07/29/2022, 2:13 PM   Staff may message me via secure chat in [place]  but this may not receive immediate response,  please page for urgent matters!  If 7PM-7AM, please contact night-coverage [email or web address]  Dictation software was used to generate the above note. Typos may occur and escape review, as with typed/written notes. Please contact Dr Sheppard Coil directly for clarity if needed.

## 2022-07-29 NOTE — Progress Notes (Signed)
ANTICOAGULATION CONSULT NOTE  Pharmacy Consult for Heparin infusion Indication: DVT/Ischemic leg   Allergies  Allergen Reactions   Gabapentin     Tremors    Bactrim [Sulfamethoxazole-Trimethoprim] Itching    Patient Measurements: Height: '5\' 2"'$  (157.5 cm) Weight: 80.7 kg (178 lb) IBW/kg (Calculated) : 50.1 Heparin Dosing Weight: 68.1 kg   Vital Signs: Temp: 97.9 F (36.6 C) (10/07 0500) Temp Source: Oral (10/07 0500) BP: 114/53 (10/07 0500) Pulse Rate: 72 (10/07 0500)  Labs: Recent Labs    07/27/22 0359 07/27/22 2252 07/28/22 0629 07/28/22 1259 07/29/22 0501  HGB 6.2*   < > 7.5* 7.7* 7.0*  HCT 20.0*   < > 23.5* 24.2* 22.4*  PLT 155  --  149*  --  155  HEPARINUNFRC 0.66  --  0.55  --  0.50  CREATININE 0.76  --  0.64  --   --    < > = values in this interval not displayed.     Estimated Creatinine Clearance: 71.7 mL/min (by C-G formula based on SCr of 0.64 mg/dL).   Medical History: Past Medical History:  Diagnosis Date   Abdominal aortic atherosclerosis ([place])    a. 05/2017 CTA abd/pelvis: significant atherosclerotic dzs of the infrarenal abd Ao w/ some mural thrombus. No aneurysm or dissection.   Acute focal ischemia of small intestine (HCC)    Acute right-sided low back pain with right-sided sciatica 06/13/2017   AKI (acute kidney injury) ([place]) 07/29/2017   Anemia    Anginal pain ([place])    a. 08/2012 Lexiscan MV: EF 54%, non ischemia/infarct.   Anxiety and depression    Baker's cyst of knee, right    a. 07/2016 U/S: 4.1 x 1.4 x 2.9 cystic structure in R poplitetal fossa.   Bell's palsy    CAD (coronary artery disease)    CHF (congestive heart failure) ([place])    a.) TTE 06/20/2017: EF 60-65%, no rwma, G1DD, no source of cardiac emboli.   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 05/2017 CTA Abd/pelvis: apparent thrombus or embolus in prox SMA (70-90%); b. 05/2017 catheter directed tPA, mechanical thrombectomy, and stenting of the SMA.   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 b.) rate/rhythm maintained on oral metoprolol tartrate; chronically anticoagulated with standard dose apixaban.   Palpitations    Peripheral edema    Personal history of other malignant neoplasm of skin 06/01/2016   Pneumonia    Pre-diabetes    Primary osteoarthritis of both knees 06/13/2017   SBO (small bowel obstruction) ([place]) 08/06/2017  Skin cancer of nose    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HCC)    Vitamin D deficiency     Medications:  Medications Prior to Admission  Medication Sig Dispense Refill Last Dose   amLODipine (NORVASC) 5 MG tablet Take 5 mg by mouth daily.   07/21/2022 at 0800   apixaban (ELIQUIS) 5 MG TABS tablet Take 1 tablet (5 mg total) by mouth 2 (two) times daily. 60 tablet 11 07/21/2022 at 0800   aspirin EC 81 MG tablet Take 81 mg by mouth daily. Swallow whole.   07/21/2022 at 0800   atorvastatin (LIPITOR) 80 MG tablet Take 80 mg by mouth at bedtime.   07/21/2022 at 2000   citalopram (CELEXA) 40 MG tablet Take 1 tablet (40 mg total) by mouth daily. 30  tablet 2 07/21/2022 at 0800   COMBIVENT RESPIMAT 20-100 MCG/ACT AERS respimat    prn at prn   esomeprazole (NEXIUM) 20 MG capsule Take 20 mg by mouth daily.   07/21/2022 at 0800   ferrous sulfate 325 (65 FE) MG tablet Take 325 mg by mouth daily.   07/21/2022 at 0800   furosemide (LASIX) 40 MG tablet Take 60 mg by mouth daily.   07/21/2022 at 0800   isosorbide mononitrate (IMDUR) 60 MG 24 hr tablet Take 60 mg by mouth daily.   07/21/2022 at 0800   metoprolol tartrate (LOPRESSOR) 50 MG tablet Take 50 mg by mouth 2 (two) times daily.   07/21/2022 at 2000   Multiple Vitamin (MULTIVITAMIN WITH MINERALS) TABS tablet Take 1 tablet by mouth daily. 30 tablet 0 07/21/2022 at 0800   omeprazole (PRILOSEC) 20 MG capsule Take 20 mg by mouth 2 (two) times daily before a meal.   07/21/2022 at 2000   ondansetron (ZOFRAN) 4 MG tablet Take 4 mg by mouth every 8 (eight) hours as needed for nausea or vomiting.   prn at prn   oxyCODONE-acetaminophen (PERCOCET/ROXICET) 5-325 MG tablet Take 1 tablet by mouth every 6 (six) hours as needed for moderate pain. 40 tablet 0 prn at prn   pregabalin (LYRICA) 100 MG capsule Take 1 capsule (100 mg total) by mouth 3 (three) times daily. 90 capsule 3 07/21/2022 at 2000   feeding supplement (ENSURE ENLIVE / ENSURE PLUS) LIQD Take 237 mLs by mouth 2 (two) times daily between meals. 237 mL 12     Assessment: Pharmacy consulted to dose heparin in this 61 year old female admitted with ischemic leg.   Pt reports taking Eliquis 5 mg PO BID PTA with last dose on 9/29 @ 2000.    Documentation:  Critically elevated aPTT at 162 with correlating anti-xa level of >1.10 s/p thrombectomy on 10/1 at 1600. Vascular believes this may be secondary to heparin 8,000 unit bolus given at 1400 during procedure. Heparin infusion rate was increased from 1400 to 1800 units/hr. Instructed by vascular surgeon to continue heparin infusion rate as is. Vascular also requests that we refrain from holding infusion. Goal  heparin level increased to 0.5 - 1.0 units/mL. If heparin level supratherapeutic, only decrease rate.  10/6: Plt reman stable. Noted slight drop in hemoglobin after 1 unit PRBC given on 10/5. Vascula surgeon will like to keep patient on anticoagulation due to severe hypercoagulable state.   Goal of Therapy:  Heparin level 0.7 - 1.0 units/mL per vascular   Monitor platelets by anticoagulation protocol: Yes  Date/time HL/aPTT Comments 10/4'@1339'$  HL= 0.63 Therapeutic @ 1400 un/hr 10/5'@0359'$  HL = 0.66 Therapeutic @ 1400 un/hr 10/6'@0629'$  HL =  0.55 Therapeutic 10/7 0501 HL = 0.50 Subtherapeutic  Plan:  Pt already given 1000 unit bolus @ 0530 Increase heparin infusion to 1550 units/hr Recheck HL in 6 hrs after rate change. CBC daily  Renda Rolls, PharmD, [facility] 07/29/2022 5:51 AM

## 2022-07-29 NOTE — Progress Notes (Addendum)
ANTICOAGULATION CONSULT NOTE  Pharmacy Consult for Heparin infusion Indication: DVT/Ischemic leg   Allergies  Allergen Reactions   Gabapentin     Tremors    Bactrim [Sulfamethoxazole-Trimethoprim] Itching    Patient Measurements: Height: '5\' 2"'$  (157.5 cm) Weight: 80.7 kg (178 lb) IBW/kg (Calculated) : 50.1 Heparin Dosing Weight: 68.1 kg   Vital Signs: Temp: 98.9 F (37.2 C) (10/07 1138) Temp Source: Oral (10/07 1138) BP: 105/55 (10/07 1138) Pulse Rate: 89 (10/07 1138)  Labs: Recent Labs    07/27/22 0359 07/27/22 2252 07/28/22 0629 07/28/22 1259 07/29/22 0501 07/29/22 1214  HGB 6.2*   < > 7.5* 7.7* 7.0*  --   HCT 20.0*   < > 23.5* 24.2* 22.4*  --   PLT 155  --  149*  --  155  --   HEPARINUNFRC 0.66  --  0.55  --  0.50 0.54  CREATININE 0.76  --  0.64  --   --   --    < > = values in this interval not displayed.     Estimated Creatinine Clearance: 71.7 mL/min (by C-G formula based on SCr of 0.64 mg/dL).   Medical History: Past Medical History:  Diagnosis Date   Abdominal aortic atherosclerosis ([place])    a. 05/2017 CTA abd/pelvis: significant atherosclerotic dzs of the infrarenal abd Ao w/ some mural thrombus. No aneurysm or dissection.   Acute focal ischemia of small intestine (HCC)    Acute right-sided low back pain with right-sided sciatica 06/13/2017   AKI (acute kidney injury) ([place]) 07/29/2017   Anemia    Anginal pain ([place])    a. 08/2012 Lexiscan MV: EF 54%, non ischemia/infarct.   Anxiety and depression    Baker's cyst of knee, right    a. 07/2016 U/S: 4.1 x 1.4 x 2.9 cystic structure in R poplitetal fossa.   Bell's palsy    CAD (coronary artery disease)    CHF (congestive heart failure) ([place])    a.) TTE 06/20/2017: EF 60-65%, no rwma, G1DD, no source of cardiac emboli.   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 05/2017 CTA Abd/pelvis: apparent thrombus or embolus in prox SMA (70-90%); b. 05/2017 catheter directed tPA, mechanical thrombectomy, and stenting of the SMA.   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 b.) rate/rhythm maintained on oral metoprolol tartrate; chronically anticoagulated with standard dose apixaban.   Palpitations    Peripheral edema    Personal history of other malignant neoplasm of skin 06/01/2016   Pneumonia    Pre-diabetes  Primary osteoarthritis of both knees 06/13/2017   SBO (small bowel obstruction) ([place]) 08/06/2017   Skin cancer of nose    Superior mesenteric artery thrombosis (HCC)    Vitamin D deficiency     Medications:  Medications Prior to Admission  Medication Sig Dispense Refill Last Dose   amLODipine (NORVASC) 5 MG tablet Take 5 mg by mouth daily.   07/21/2022 at 0800   apixaban (ELIQUIS) 5 MG TABS tablet Take 1 tablet (5 mg total) by mouth 2 (two) times daily. 60 tablet 11 07/21/2022 at 0800   aspirin EC 81 MG tablet Take 81 mg by mouth daily. Swallow whole.   07/21/2022 at 0800   atorvastatin (LIPITOR) 80 MG tablet Take 80 mg by mouth at bedtime.   07/21/2022 at 2000   citalopram (CELEXA) 40 MG tablet Take 1 tablet  (40 mg total) by mouth daily. 30 tablet 2 07/21/2022 at 0800   COMBIVENT RESPIMAT 20-100 MCG/ACT AERS respimat    prn at prn   esomeprazole (NEXIUM) 20 MG capsule Take 20 mg by mouth daily.   07/21/2022 at 0800   ferrous sulfate 325 (65 FE) MG tablet Take 325 mg by mouth daily.   07/21/2022 at 0800   furosemide (LASIX) 40 MG tablet Take 60 mg by mouth daily.   07/21/2022 at 0800   isosorbide mononitrate (IMDUR) 60 MG 24 hr tablet Take 60 mg by mouth daily.   07/21/2022 at 0800   metoprolol tartrate (LOPRESSOR) 50 MG tablet Take 50 mg by mouth 2 (two) times daily.   07/21/2022 at 2000   Multiple Vitamin (MULTIVITAMIN WITH MINERALS) TABS tablet Take 1 tablet by mouth daily. 30 tablet 0 07/21/2022 at 0800   omeprazole (PRILOSEC) 20 MG capsule Take 20 mg by mouth 2 (two) times daily before a meal.   07/21/2022 at 2000   ondansetron (ZOFRAN) 4 MG tablet Take 4 mg by mouth every 8 (eight) hours as needed for nausea or vomiting.   prn at prn   oxyCODONE-acetaminophen (PERCOCET/ROXICET) 5-325 MG tablet Take 1 tablet by mouth every 6 (six) hours as needed for moderate pain. 40 tablet 0 prn at prn   pregabalin (LYRICA) 100 MG capsule Take 1 capsule (100 mg total) by mouth 3 (three) times daily. 90 capsule 3 07/21/2022 at 2000   feeding supplement (ENSURE ENLIVE / ENSURE PLUS) LIQD Take 237 mLs by mouth 2 (two) times daily between meals. 237 mL 12     Assessment: Pharmacy consulted to dose heparin in this 61 year old female admitted with ischemic leg.   Pt reports taking Eliquis 5 mg PO BID PTA with last dose on 9/29 @ 2000.    Documentation:  Critically elevated aPTT at 162 with correlating anti-xa level of >1.10 s/p thrombectomy on 10/1 at 1600. Vascular believes this may be secondary to heparin 8,000 unit bolus given at 1400 during procedure. Heparin infusion rate was increased from 1400 to 1800 units/hr. Instructed by vascular surgeon to continue heparin infusion rate as is. Vascular also requests that we refrain  from holding infusion. Goal heparin level increased to 0.7 - 1.0 units/mL. If heparin level supratherapeutic, only decrease rate.  10/6: Plt reman stable. Noted slight drop in hemoglobin after 1 unit PRBC given on 10/5. Vascula surgeon will like to keep patient on anticoagulation due to severe hypercoagulable state.   Goal of Therapy:  Heparin level 0.7 - 1.0 units/mL per vascular   Monitor platelets by anticoagulation protocol: Yes  Date/time HL/aPTT Comments 10/4'@1339'$  HL=  0.63 Therapeutic @ 1400 un/hr 10/5'@0359'$  HL = 0.66 Therapeutic @ 1400 un/hr 10/6'@0629'$  HL = 0.55 Therapeutic 10/7 0501 HL = 0.50 Subtherapeutic 10/7 1214 HL = 0.54 subtherapeutic   Plan:  Heparin level is subtherapeutic. Will increase heparin infusion to 1700 units/hr. Recheck heparin level in 6 hours. CBC daily while heparin.    Eleonore Chiquito, PharmD, BCPS 07/29/2022 12:55 PM

## 2022-07-29 NOTE — Progress Notes (Signed)
6 Days Post-Op   Subjective/Chief Complaint: Called to patient bedside secondary to concern of cool LEFT toes with cyanosis and pain. Upon my evaluation patient was sleeping. Patient woke and complained of generalized pain. Per nursing her foot improved now with only 5th toe cyanosis   Objective: Vital signs in last 24 hours: Temp:  [97.8 F (36.6 C)-99.3 F (37.4 C)] 97.9 F (36.6 C) (10/07 0500) Pulse Rate:  [72-83] 72 (10/07 0500) Resp:  [12-20] 20 (10/07 0500) BP: (81-114)/(52-60) 114/53 (10/07 0500) SpO2:  [96 %-100 %] 99 % (10/07 0500) Last BM Date : 07/25/22  Intake/Output from previous day: 10/06 0701 - 10/07 0700 In: 480 [P.O.:480] Out: 2000 [Urine:2000] Intake/Output this shift: No intake/output data recorded.  General appearance: no distress VASC: LEFT arm- edema, warm, LEFT leg- + triphasic signal over graft at mid and distal lateral side, +femoral pulse. Foot- warm to toes- 5th toe cyanosis, +motor/+sensory  Lab Results:  Recent Labs    07/28/22 0629 07/28/22 1259 07/29/22 0501  WBC 14.1*  --  12.8*  HGB 7.5* 7.7* 7.0*  HCT 23.5* 24.2* 22.4*  PLT 149*  --  155   BMET Recent Labs    07/27/22 0359 07/28/22 0629  NA 135 138  K 4.2 4.1  CL 108 109  CO2 22 26  GLUCOSE 115* 99  BUN 22 18  CREATININE 0.76 0.64  CALCIUM 7.7* 7.9*   PT/INR No results for input(s): "LABPROT", "INR" in the last 72 hours. ABG No results for input(s): "PHART", "HCO3" in the last 72 hours.  Invalid input(s): "PCO2", "PO2"  Studies/Results: No results found.  Anti-infectives: Anti-infectives (From admission, onward)    Start     Dose/Rate Route Frequency Ordered Stop   07/27/22 1400  ceFEPIme (MAXIPIME) 2 g in sodium chloride 0.9 % 100 mL IVPB        2 g 200 mL/hr over 30 Minutes Intravenous Every 8 hours 07/27/22 1233     07/25/22 2200  ceFEPIme (MAXIPIME) 2 g in sodium chloride 0.9 % 100 mL IVPB  Status:  Discontinued        2 g 200 mL/hr over 30 Minutes  Intravenous Every 12 hours 07/25/22 0730 07/27/22 1233   07/24/22 1200  ceFEPIme (MAXIPIME) 2 g in sodium chloride 0.9 % 100 mL IVPB  Status:  Discontinued        2 g 200 mL/hr over 30 Minutes Intravenous Every 8 hours 07/24/22 0957 07/25/22 0730   07/23/22 1630  ceFAZolin (ANCEF) IVPB 2g/100 mL premix        2 g 200 mL/hr over 30 Minutes Intravenous Every 8 hours 07/23/22 1227 07/23/22 2357       Assessment/Plan: s/p Procedure(s): THROMBECTOMY OF GREAT VESSEL WITH WOUND VAC DRESSING CHANGE (Left) POD #6 s/p left axillofemoral bypass for ischemic left leg  The patient aPTT is subtherapeutic- Heparin Bolus 1000 Units given, Increased therapeutic range to 0.7-1.0- discussed with PharmD. May have showered thrombus to toes; in addition patient must remain on the higher end of aPTT as she is highly prone to thrombotic phenomenon.  Also- poor PO intake and element of dehydration- ensure increased PO intake or IVF- contributory to thrombotic events. Will monitor foot and Left arm closely; monitor CBC. Continue supportive care  LOS: 7 days    Jamesetta So A 07/29/2022

## 2022-07-30 ENCOUNTER — Inpatient Hospital Stay: Payer: Medicare Other

## 2022-07-30 DIAGNOSIS — I480 Paroxysmal atrial fibrillation: Secondary | ICD-10-CM | POA: Diagnosis not present

## 2022-07-30 DIAGNOSIS — I70222 Atherosclerosis of native arteries of extremities with rest pain, left leg: Secondary | ICD-10-CM | POA: Diagnosis not present

## 2022-07-30 DIAGNOSIS — I5032 Chronic diastolic (congestive) heart failure: Secondary | ICD-10-CM | POA: Diagnosis not present

## 2022-07-30 DIAGNOSIS — N179 Acute kidney failure, unspecified: Secondary | ICD-10-CM | POA: Diagnosis not present

## 2022-07-30 LAB — TYPE AND SCREEN
ABO/RH(D): A POS
Antibody Screen: NEGATIVE
Unit division: 0
Unit division: 0
Unit division: 0

## 2022-07-30 LAB — CBC
HCT: 27.1 % — ABNORMAL LOW (ref 36.0–46.0)
Hemoglobin: 9 g/dL — ABNORMAL LOW (ref 12.0–15.0)
MCH: 29.3 pg (ref 26.0–34.0)
MCHC: 33.2 g/dL (ref 30.0–36.0)
MCV: 88.3 fL (ref 80.0–100.0)
Platelets: 198 10*3/uL (ref 150–400)
RBC: 3.07 MIL/uL — ABNORMAL LOW (ref 3.87–5.11)
RDW: 17.2 % — ABNORMAL HIGH (ref 11.5–15.5)
WBC: 12.3 10*3/uL — ABNORMAL HIGH (ref 4.0–10.5)
nRBC: 0.7 % — ABNORMAL HIGH (ref 0.0–0.2)

## 2022-07-30 LAB — GLUCOSE, CAPILLARY
Glucose-Capillary: 127 mg/dL — ABNORMAL HIGH (ref 70–99)
Glucose-Capillary: 137 mg/dL — ABNORMAL HIGH (ref 70–99)
Glucose-Capillary: 159 mg/dL — ABNORMAL HIGH (ref 70–99)
Glucose-Capillary: 189 mg/dL — ABNORMAL HIGH (ref 70–99)
Glucose-Capillary: 77 mg/dL (ref 70–99)
Glucose-Capillary: 95 mg/dL (ref 70–99)
Glucose-Capillary: 97 mg/dL (ref 70–99)

## 2022-07-30 LAB — BASIC METABOLIC PANEL
Anion gap: 3 — ABNORMAL LOW (ref 5–15)
BUN: 17 mg/dL (ref 8–23)
CO2: 28 mmol/L (ref 22–32)
Calcium: 8 mg/dL — ABNORMAL LOW (ref 8.9–10.3)
Chloride: 107 mmol/L (ref 98–111)
Creatinine, Ser: 0.59 mg/dL (ref 0.44–1.00)
GFR, Estimated: >60 mL/min (ref >60)
Glucose, Bld: 106 mg/dL — ABNORMAL HIGH (ref 70–99)
Potassium: 3.7 mmol/L (ref 3.5–5.1)
Sodium: 138 mmol/L (ref 135–145)

## 2022-07-30 LAB — HEMOGLOBIN AND HEMATOCRIT, BLOOD
HCT: 24.2 % — ABNORMAL LOW (ref 36.0–46.0)
Hemoglobin: 7.7 g/dL — ABNORMAL LOW (ref 12.0–15.0)

## 2022-07-30 LAB — BPAM RBC
Blood Product Expiration Date: 202310072359
Blood Product Expiration Date: 202311022359
Blood Product Expiration Date: 202311032359
ISSUE DATE / TIME: 202310051051
ISSUE DATE / TIME: 202310051610
ISSUE DATE / TIME: 202310071752
Unit Type and Rh: 600
Unit Type and Rh: 6200
Unit Type and Rh: 6200

## 2022-07-30 LAB — HEPARIN LEVEL (UNFRACTIONATED)
Heparin Unfractionated: 0.81 IU/mL — ABNORMAL HIGH (ref 0.30–0.70)
Heparin Unfractionated: 0.71 IU/mL — ABNORMAL HIGH (ref 0.30–0.70)

## 2022-07-30 MED ORDER — IOHEXOL 300 MG/ML  SOLN
75.0000 mL | Freq: Once | INTRAMUSCULAR | Status: AC | PRN
  Administered 2022-07-30: 75 mL via INTRAVENOUS

## 2022-07-30 NOTE — Progress Notes (Signed)
PROGRESS NOTE    Carla [name]   CNO:[id number] DOB: 01/22/1961  DOA: 07/22/2022 Date of Service: 07/30/22 PCP: [facility] [place]     Brief Narrative / Hospital Course:  Carla [name] is a 61 y.o. female with medical history significant for peripheral arterial disease s/p right AKA, chronic diastolic dysfunction CHF, anxiety and depression, COPD, paroxysmal A-fib on chronic anticoagulation therapy, who presents to the ED with left leg pain for the past 1 week which had been progressively worsening.  09/30: CTA found with ischemic LLE. The patient was evaluated by vascular surgeon, Dr. Trula Slade in the ED who will take her to the OR on Sunday 10/1.  Patient started on heparin. Admitted to hospitalist service.  10/01: pt went to OR for common femoral artery bypass graft, post OR woke up c/o leg pain, mottled leg, went back to OR for thrombectomy of Left axillary femoral bypass graft with revision. Transferred to ICU.  10/02: Started on cefepime for concern for abscess of right BKA stump 10/03-10/04: Left upper extremity swelling noted overnight, ultrasound pending as of this AM --> no DVT but (+)hematoma. D/w vascular - no procedures planned for this (expected w/ anticoagulation) and ok to transfer to floor. Pain control has been a challenge but she is better w/ anxiolytics.  10/05: Afib w/ occasional rapid rate and hypotension.  Hemoglobin down to 6.2.  Transfused 2 units PRBC, VS stabilized.  10/06: Hemoglobin 9.0 posttransfusion, 7.5 this morning.  Follow H/H midday today Hgb 7.7, hold off on transfusion for now, CBC in AM. PT recommending SNF. 10/07: concern for thrombosis to toes, vascular following w/ pharmacy for heparin management. Transfusing another unit blood for Hgb 7.0  10/08: continues to c/o pain. Will image RLE    Consultants:  Vascular Surgery   Procedures: 10/01: Left axillary to common femoral artery bypass graft to treat ischemia 10/01: Thrombectomy of left  axillary femoral bypass graft with revision  Antimicrobials:  Cefazolin x3 doses  Cefepime 10/2 - present    ASSESSMENT & PLAN:   Principal Problem:   Critical limb ischemia of left lower extremity ([place]) Active Problems:   AKI (acute kidney injury) ([place])   PAF (paroxysmal atrial fibrillation) (HCC)   Chronic diastolic CHF (congestive heart failure) (HCC)   Abscess of knee, right   Hematoma   Critical limb ischemia of left lower extremity ([place]) Peripheral artery disease s/p right BKA S/p OR initially 10/01 for Left axillary to common femoral artery bypass graft, Prevena wound VAC placed .  Post surgery patient had left foot pain and mottled leg was taken to the OR again 10/01, thrombectomy of left axillofemoral bypass graft Continue on heparin per vascular surgery, home eliquis held (Hx Afib) Continuing aspirin, statin Pain control  Left upper extremity swelling Hematoma LUE Noted overnight 10/03-10/04 [place] LUE no DVT but there is large (6.6 x 6.3 x 6.5 cm (volume = 140 cm^3)) hematoma under the left axillary surgical site. D/w vascular - no procedures planned for this (expected w/ anticoagulation) and ok to transfer to floor Monitoring H/H see anemia  Acute blood loss anemia, surgical intervention, hematoma Status posttransfusion 2 units PRBC 07/27/2022, 1 unit PRBC 07/29/22  Serial H/H & CBC  Rt knee fluid collection concerning for abscess At site of amputation, found on CT concern for abscess Started on Cefepime Vascular following, avoiding procedures Continues to c/o pain despite course of abx, will get XR preliminary but expect will need CT    AKI - resolved Prerenal  D/c'ed ivf Monitor BMP   Avoid nephrotoxic meds  PAF (paroxysmal atrial fibrillation) (HCC) Eliquis on hold for washout Continue metoprolol for rate control On Heparin for limb ischemia   Chronic diastolic CHF (congestive heart failure) (HCC) Clinically compensated Continue metoprolol and  Imdur    DVT prophylaxis: n/a on heparin as above  Pertinent IV fluids/nutrition: full liquid diet to advance as tolerate  Central lines / invasive devices: none  Code Status: FULL CODE Family Communication: none at this time   Disposition: remains inpatient  TOC needs: will need SNF Barriers to discharge / significant pending items: vascular clearance, IV antibiotics for now treating possible abscess, debility and needing SNF placement, anemia monitoring, on heparin, will be here at least few more days                Subjective:  Still significant LUE pain and RLE pain at stump, feeling weak stil but a bit better after transfusion yesterday. No CP/SOB.        Objective:  Vitals:   07/30/22 0026 07/30/22 0403 07/30/22 0816 07/30/22 1314  BP: (!) 98/54 (!) 123/59 (!) 108/50 (!) 125/51  Pulse: 75 82 75 86  Resp:  '14 16 20  '$ Temp:  98.4 F (36.9 C) 98.7 F (37.1 C) 97.6 F (36.4 C)  TempSrc:   Oral   SpO2:  96% 96% 99%  Weight:      Height:        Intake/Output Summary (Last 24 hours) at 07/30/2022 1512 Last data filed at 07/30/2022 1315 Gross per 24 hour  Intake 774 ml  Output 2500 ml  Net -1726 ml   Filed Weights   07/22/22 0029  Weight: 80.7 kg    Examination:  Constitutional:  VS as above General Appearance: alert, obese, ill appearing but NAD Respiratory: Normal respiratory effort No wheeze No rhonchi No rales Cardiovascular: Irreg irreg, reg rate  No rub/gallop auscultated Gastrointestinal: No tenderness Musculoskeletal:  No clubbing/cyanosis of digits Symmetrical movement in all extremities R BKA LUE (+)tenderness  INtact surgical site L upper chest but swollen, ecchymosis  Neurological: No cranial nerve deficit on limited exam Alert Psychiatric: Normal judgment/insight Depressed/anxious mood and affect       Scheduled Medications:   sodium chloride   Intravenous Once   aspirin EC  81 mg Oral Q0600   atorvastatin  80  mg Oral QHS   Chlorhexidine Gluconate Cloth  6 each Topical Daily   citalopram  40 mg Oral Daily   docusate sodium  100 mg Oral Daily   insulin aspart  0-15 Units Subcutaneous Q4H   isosorbide mononitrate  60 mg Oral Daily   pantoprazole  40 mg Oral Daily   pregabalin  100 mg Oral TID    Continuous Infusions:  sodium chloride     ceFEPime (MAXIPIME) IV 200 mL/hr at 07/30/22 1100   heparin 1,700 Units/hr (07/30/22 1256)   magnesium sulfate bolus IVPB      PRN Medications:  sodium chloride, acetaminophen **OR** acetaminophen, acetaminophen **OR** acetaminophen, alum & mag hydroxide-simeth, guaiFENesin-dextromethorphan, hydrALAZINE, HYDROcodone-acetaminophen, HYDROmorphone (DILAUDID) injection, labetalol, LORazepam, magnesium sulfate bolus IVPB, methocarbamol, metoprolol tartrate, ondansetron **OR** ondansetron (ZOFRAN) IV, ondansetron, mouth rinse, oxyCODONE, phenol, polyethylene glycol, potassium chloride  Antimicrobials:  Anti-infectives (From admission, onward)    Start     Dose/Rate Route Frequency Ordered Stop   07/27/22 1400  ceFEPIme (MAXIPIME) 2 g in sodium chloride 0.9 % 100 mL IVPB        2 g 200 mL/hr  over 30 Minutes Intravenous Every 8 hours 07/27/22 1233     07/25/22 2200  ceFEPIme (MAXIPIME) 2 g in sodium chloride 0.9 % 100 mL IVPB  Status:  Discontinued        2 g 200 mL/hr over 30 Minutes Intravenous Every 12 hours 07/25/22 0730 07/27/22 1233   07/24/22 1200  ceFEPIme (MAXIPIME) 2 g in sodium chloride 0.9 % 100 mL IVPB  Status:  Discontinued        2 g 200 mL/hr over 30 Minutes Intravenous Every 8 hours 07/24/22 0957 07/25/22 0730   07/23/22 1630  ceFAZolin (ANCEF) IVPB 2g/100 mL premix        2 g 200 mL/hr over 30 Minutes Intravenous Every 8 hours 07/23/22 1227 07/23/22 2357       Data Reviewed: I have personally reviewed following labs and imaging studies  CBC: Recent Labs  Lab 07/26/22 0411 07/27/22 0359 07/27/22 2252 07/28/22 0629 07/28/22 1259  07/29/22 0501 07/29/22 1210 07/30/22 0429  WBC 24.5* 15.6*  --  14.1*  --  12.8*  --  12.3*  HGB 8.3* 6.2*   < > 7.5* 7.7* 7.0* 6.9* 9.0*  HCT 26.7* 20.0*   < > 23.5* 24.2* 22.4* 22.2* 27.1*  MCV 89.0 91.3  --  89.4  --  91.4  --  88.3  PLT 158 155  --  149*  --  155  --  198   < > = values in this interval not displayed.   Basic Metabolic Panel: Recent Labs  Lab 07/25/22 0354 07/26/22 0411 07/27/22 0359 07/28/22 0629 07/30/22 0429  NA 138 135 135 138 138  K 4.4 4.5 4.2 4.1 3.7  CL 111 110 108 109 107  CO2 21* 20* '22 26 28  '$ GLUCOSE 172* 134* 115* 99 106*  BUN 25* 31* '22 18 17  '$ CREATININE 1.29* 1.03* 0.76 0.64 0.59  CALCIUM 7.9* 7.7* 7.7* 7.9* 8.0*   GFR: Estimated Creatinine Clearance: 71.7 mL/min (by C-G formula based on SCr of 0.59 mg/dL). Liver Function Tests: No results for input(s): "AST", "ALT", "ALKPHOS", "BILITOT", "PROT", "ALBUMIN" in the last 168 hours.  No results for input(s): "LIPASE", "AMYLASE" in the last 168 hours. No results for input(s): "AMMONIA" in the last 168 hours. Coagulation Profile: No results for input(s): "INR", "PROTIME" in the last 168 hours.  Cardiac Enzymes: No results for input(s): "CKTOTAL", "CKMB", "CKMBINDEX", "TROPONINI" in the last 168 hours. BNP (last 3 results) No results for input(s): "PROBNP" in the last 8760 hours. HbA1C: No results for input(s): "HGBA1C" in the last 72 hours.  CBG: Recent Labs  Lab 07/29/22 2028 07/30/22 0019 07/30/22 0428 07/30/22 0818 07/30/22 1310  GLUCAP 124* 97 95 159* 189*   Lipid Profile: No results for input(s): "CHOL", "HDL", "LDLCALC", "TRIG", "CHOLHDL", "LDLDIRECT" in the last 72 hours. Thyroid Function Tests: No results for input(s): "TSH", "T4TOTAL", "FREET4", "T3FREE", "THYROIDAB" in the last 72 hours. Anemia Panel: No results for input(s): "VITAMINB12", "FOLATE", "FERRITIN", "TIBC", "IRON", "RETICCTPCT" in the last 72 hours. Urine analysis: N/a Sepsis  Labs: '@LABRCNTIP'$ (procalcitonin:4,lacticidven:4)  Recent Results (from the past 240 hour(s))  MRSA Next Gen by PCR, Nasal     Status: Abnormal   Collection Time: 07/23/22 12:17 AM   Specimen: Nasal Mucosa; Nasal Swab  Result Value Ref Range Status   MRSA by PCR Next Gen DETECTED (A) NOT DETECTED Final    Comment: RESULT CALLED TO, READ BACK BY AND VERIFIED WITH: Clide Cliff @ 3220 on 07/23/2022 by CAF (NOTE) The  GeneXpert MRSA Assay (FDA approved for NASAL specimens only), is one component of a comprehensive MRSA colonization surveillance program. It is not intended to diagnose MRSA infection nor to guide or monitor treatment for MRSA infections. Test performance is not FDA approved in patients less than 41 years old. Performed at [facility], [address] [place], [place] [address]          Radiology Studies: CT ANGIO AO+BIFEM W & OR WO CONTRAST  Result Date: 07/22/2022 CLINICAL DATA:  Arterial embolism lower extremity EXAM: CT ANGIOGRAPHY OF ABDOMINAL AORTA WITH ILIOFEMORAL RUNOFF TECHNIQUE: Multidetector CT imaging of the abdomen, pelvis and lower extremities was performed using the standard protocol during bolus administration of intravenous contrast. Multiplanar CT image reconstructions and MIPs were obtained to evaluate the vascular anatomy. RADIATION DOSE REDUCTION: This exam was performed according to the departmental dose-optimization program which includes automated exposure control, adjustment of the mA and/or kV according to patient size and/or use of iterative reconstruction technique. CONTRAST:  190m OMNIPAQUE IOHEXOL 350 MG/ML SOLN COMPARISON:  None Available. CT abdomen and pelvis 03/21/2022 FINDINGS: VASCULAR Aorta: Aorto bi iliac stent is occluded. The aorta is otherwise normal in caliber. Celiac: Patent without dissection or aneurysm. SMA: The SMA stent is occluded at the origin. With distal reconstitution. Renals: Patent bilateral renal arteries  without aneurysm or dissection. IMA: Not visualized and likely occluded. RIGHT Lower Extremity Inflow: The right common femoral artery stent is occluded with thrombus extending into the right external iliac artery. The right internal iliac artery is likely occluded at its origin with distal reconstitution. Reconstitution of the distal external iliac artery. Outflow: The common femoral, profunda femoral, and superficial femoral arteries are patent and likely supplied from collateral flow. Runoff: Not applicable.  Right above the knee amputation. LEFT Lower Extremity Inflow: The common iliac stent external iliac, and proximal internal iliac are occluded. Distal reconstitution of the internal iliac. Outflow: Reconstitution of the common femoral artery. The common femoral, profunda femoral, superficial femoral, and popliteal arteries are patent. Runoff: Three-vessel runoff is patent to a 12-13 cm above the ankle in the calf. At this point the vessels are nonopacified possibly due to slow flow though occlusion is not excluded. Veins: No obvious venous abnormality within the limitations of this arterial phase study. Review of the MIP images confirms the above findings. NON-VASCULAR Lower chest: No acute abnormality. Hepatobiliary: Cholecystectomy. No suspicious focal hepatic lesion. No biliary dilation. Pancreas: Unremarkable. Spleen: Unremarkable. Adrenals/Urinary Tract: Adrenal glands are unremarkable. Kidneys are normal, without renal calculi, focal lesion, or hydronephrosis. Bladder is unremarkable. Stomach/Bowel: Distended stomach. Normal caliber large and small bowel. Colonic diverticulosis without diverticulitis. Herniation of small and large bowel into the large ventral abdominal wall hernia. No evidence of obstruction or strangulation. Bowel anastomosis is present within the hernia sac. Lymphatic: No suspicious adenopathy. Reproductive: Hysterectomy.  No adnexal masses. Other: No free intraperitoneal fluid or  gas. Musculoskeletal: Within the distal right thigh at the site of amputation there is a 5.1 x 4.0 cm fluid collection with peripheral enhancement. Edema within the left foot. No acute osseous abnormality. 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 Patent three-vessel runoff of the left lower extremity to the level of the calf where there is non opacification of the distal lower extremity arteries. This may be due to slow flow however occlusion is not excluded. Occluded SMA stent at the origin with distal reconstitution. NON-VASCULAR Right above-the-knee amputation. At the site  of amputation there is a fluid collection concerning for abscess. Large ventral abdominal wall hernia containing nonobstructive loops of bowel. Emergent results were called by telephone at the time of interpretation on 07/22/2022 at 1:14 am to provider JADE SUNG , who verbally acknowledged these results. Electronically Signed   By: Placido Sou M.D.   On: 07/22/2022 01:27            LOS: 8 days       Emeterio Reeve, DO [facility] 07/30/2022, 3:12 PM   Staff may message me via secure chat in [place]  but this may not receive immediate response,  please page for urgent matters!  If 7PM-7AM, please contact night-coverage [email or web address]  Dictation software was used to generate the above note. Typos may occur and escape review, as with typed/written notes. Please contact Dr Sheppard Coil directly for clarity if needed.

## 2022-07-30 NOTE — Progress Notes (Signed)
ANTICOAGULATION CONSULT NOTE  Pharmacy Consult for Heparin infusion Indication: DVT/Ischemic leg   Allergies  Allergen Reactions   Gabapentin     Tremors    Bactrim [Sulfamethoxazole-Trimethoprim] Itching    Patient Measurements: Height: '5\' 2"'$  (157.5 cm) Weight: 80.7 kg (178 lb) IBW/kg (Calculated) : 50.1 Heparin Dosing Weight: 68.1 kg   Vital Signs: Temp: 98.7 F (37.1 C) (10/08 0816) Temp Source: Oral (10/08 0816) BP: 108/50 (10/08 0816) Pulse Rate: 75 (10/08 0816)  Labs: Recent Labs    07/28/22 0629 07/28/22 1259 07/29/22 0501 07/29/22 1210 07/29/22 1214 07/30/22 0429 07/30/22 1212  HGB 7.5*   < > 7.0* 6.9*  --  9.0*  --   HCT 23.5*   < > 22.4* 22.2*  --  27.1*  --   PLT 149*  --  155  --   --  198  --   HEPARINUNFRC 0.55  --  0.50  --  0.54 0.81* 0.71*  CREATININE 0.64  --   --   --   --  0.59  --    < > = values in this interval not displayed.     Estimated Creatinine Clearance: 71.7 mL/min (by C-G formula based on SCr of 0.59 mg/dL).   Medical History: Past Medical History:  Diagnosis Date   Abdominal aortic atherosclerosis ([place])    a. 05/2017 CTA abd/pelvis: significant atherosclerotic dzs of the infrarenal abd Ao w/ some mural thrombus. No aneurysm or dissection.   Acute focal ischemia of small intestine (HCC)    Acute right-sided low back pain with right-sided sciatica 06/13/2017   AKI (acute kidney injury) ([place]) 07/29/2017   Anemia    Anginal pain ([place])    a. 08/2012 Lexiscan MV: EF 54%, non ischemia/infarct.   Anxiety and depression    Baker's cyst of knee, right    a. 07/2016 U/S: 4.1 x 1.4 x 2.9 cystic structure in R poplitetal fossa.   Bell's palsy    CAD (coronary artery disease)    CHF (congestive heart failure) ([place])    a.) TTE 06/20/2017: EF 60-65%, no rwma, G1DD, no source of cardiac emboli.   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 05/2017 CTA Abd/pelvis: apparent thrombus or embolus in prox SMA (70-90%); b. 05/2017 catheter directed tPA, mechanical thrombectomy, and stenting of the SMA.   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 b.) rate/rhythm maintained on oral metoprolol tartrate; chronically anticoagulated with standard dose apixaban.   Palpitations    Peripheral edema    Personal history of  other malignant neoplasm of skin 06/01/2016   Pneumonia    Pre-diabetes    Primary osteoarthritis of both knees 06/13/2017   SBO (small bowel obstruction) ([place]) 08/06/2017   Skin cancer of nose    Superior mesenteric artery thrombosis (HCC)    Vitamin D deficiency     Medications:  Medications Prior to Admission  Medication Sig Dispense Refill Last Dose   amLODipine (NORVASC) 5 MG tablet Take 5 mg by mouth daily.   07/21/2022 at 0800   apixaban (ELIQUIS) 5 MG TABS tablet Take 1 tablet (5 mg total) by mouth 2 (two) times daily. 60 tablet 11 07/21/2022 at 0800   aspirin EC 81 MG tablet Take 81 mg by mouth daily. Swallow whole.   07/21/2022 at 0800   atorvastatin (LIPITOR) 80 MG tablet Take 80 mg by mouth at bedtime.   07/21/2022 at 2000    citalopram (CELEXA) 40 MG tablet Take 1 tablet (40 mg total) by mouth daily. 30 tablet 2 07/21/2022 at 0800   COMBIVENT RESPIMAT 20-100 MCG/ACT AERS respimat    prn at prn   esomeprazole (NEXIUM) 20 MG capsule Take 20 mg by mouth daily.   07/21/2022 at 0800   ferrous sulfate 325 (65 FE) MG tablet Take 325 mg by mouth daily.   07/21/2022 at 0800   furosemide (LASIX) 40 MG tablet Take 60 mg by mouth daily.   07/21/2022 at 0800   isosorbide mononitrate (IMDUR) 60 MG 24 hr tablet Take 60 mg by mouth daily.   07/21/2022 at 0800   metoprolol tartrate (LOPRESSOR) 50 MG tablet Take 50 mg by mouth 2 (two) times daily.   07/21/2022 at 2000   Multiple Vitamin (MULTIVITAMIN WITH MINERALS) TABS tablet Take 1 tablet by mouth daily. 30 tablet 0 07/21/2022 at 0800   omeprazole (PRILOSEC) 20 MG capsule Take 20 mg by mouth 2 (two) times daily before a meal.   07/21/2022 at 2000   ondansetron (ZOFRAN) 4 MG tablet Take 4 mg by mouth every 8 (eight) hours as needed for nausea or vomiting.   prn at prn   oxyCODONE-acetaminophen (PERCOCET/ROXICET) 5-325 MG tablet Take 1 tablet by mouth every 6 (six) hours as needed for moderate pain. 40 tablet 0 prn at prn   pregabalin (LYRICA) 100 MG capsule Take 1 capsule (100 mg total) by mouth 3 (three) times daily. 90 capsule 3 07/21/2022 at 2000   feeding supplement (ENSURE ENLIVE / ENSURE PLUS) LIQD Take 237 mLs by mouth 2 (two) times daily between meals. 237 mL 12     Assessment: Pharmacy consulted to dose heparin in this 61 year old female admitted with ischemic leg.   Pt reports taking Eliquis 5 mg PO BID PTA with last dose on 9/29 @ 2000.    Documentation:  Critically elevated aPTT at 162 with correlating anti-xa level of >1.10 s/p thrombectomy on 10/1 at 1600. Vascular believes this may be secondary to heparin 8,000 unit bolus given at 1400 during procedure. Heparin infusion rate was increased from 1400 to 1800 units/hr. Instructed by vascular surgeon to continue heparin infusion  rate as is. Vascular also requests that we refrain from holding infusion. Goal heparin level increased to 0.7 - 1.0 units/mL due to severe hypercoagulable state. If heparin level supratherapeutic, only decrease rate.  10/8: Hgb, Hct, PLT trending up.  Goal of Therapy:  Heparin level 0.7 - 1.0 units/mL per vascular   Monitor platelets by anticoagulation protocol: Yes  Date/time HL/aPTT Comments 10/4'@1339'$  HL= 0.63 Therapeutic @ 1400  un/hr 10/5'@0359'$  HL = 0.66 Therapeutic @ 1400 un/hr 10/6'@0629'$  HL = 0.55 Therapeutic 10/7 0501 HL = 0.50 Subtherapeutic 10/7 1214 HL = 0.54 Subtherapeutic 10/8 0429 HL = 0.81 Therapeutic x 1 10/8 1212 HL = 0.71 Therapeutic x 2  Plan:  Will continue heparin infusion at 1700 units/hr. Recheck heparin level with AM labs.  Monitor CBC daily while heparin.   Dara Hoyer, PharmD PGY-1 Pharmacy Resident 07/30/2022 12:55 PM

## 2022-07-30 NOTE — Progress Notes (Signed)
7 Days Post-Op   Subjective/Chief Complaint: Doing OK. Denies foot pain.   Objective: Vital signs in last 24 hours: Temp:  [98.1 F (36.7 C)-99.5 F (37.5 C)] 98.7 F (37.1 C) (10/08 0816) Pulse Rate:  [74-89] 75 (10/08 0816) Resp:  [14-20] 16 (10/08 0816) BP: (82-123)/(50-59) 108/50 (10/08 0816) SpO2:  [95 %-98 %] 96 % (10/08 0816) Last BM Date : 07/29/22  Intake/Output from previous day: 10/07 0701 - 10/08 0700 In: 1140 [I.V.:170; Blood:370; IV Piggyback:600] Out: 2675 [Urine:2675] Intake/Output this shift: No intake/output data recorded.  Gen: Alert, NAD VASC: LEFT arm- edema, warm, LEFT leg- + triphasic signal over graft at mid and distal lateral side, +femoral pulse. Foot- warm to toes- 5th toe cyanosis resolved, +motor/+sensory    Lab Results:  Recent Labs    07/29/22 0501 07/29/22 1210 07/30/22 0429  WBC 12.8*  --  12.3*  HGB 7.0* 6.9* 9.0*  HCT 22.4* 22.2* 27.1*  PLT 155  --  198   BMET Recent Labs    07/28/22 0629 07/30/22 0429  NA 138 138  K 4.1 3.7  CL 109 107  CO2 26 28  GLUCOSE 99 106*  BUN 18 17  CREATININE 0.64 0.59  CALCIUM 7.9* 8.0*   PT/INR No results for input(s): "LABPROT", "INR" in the last 72 hours. ABG No results for input(s): "PHART", "HCO3" in the last 72 hours.  Invalid input(s): "PCO2", "PO2"  Studies/Results: No results found.  Anti-infectives: Anti-infectives (From admission, onward)    Start     Dose/Rate Route Frequency Ordered Stop   07/27/22 1400  ceFEPIme (MAXIPIME) 2 g in sodium chloride 0.9 % 100 mL IVPB        2 g 200 mL/hr over 30 Minutes Intravenous Every 8 hours 07/27/22 1233     07/25/22 2200  ceFEPIme (MAXIPIME) 2 g in sodium chloride 0.9 % 100 mL IVPB  Status:  Discontinued        2 g 200 mL/hr over 30 Minutes Intravenous Every 12 hours 07/25/22 0730 07/27/22 1233   07/24/22 1200  ceFEPIme (MAXIPIME) 2 g in sodium chloride 0.9 % 100 mL IVPB  Status:  Discontinued        2 g 200 mL/hr over 30 Minutes  Intravenous Every 8 hours 07/24/22 0957 07/25/22 0730   07/23/22 1630  ceFAZolin (ANCEF) IVPB 2g/100 mL premix        2 g 200 mL/hr over 30 Minutes Intravenous Every 8 hours 07/23/22 1227 07/23/22 2357       Assessment/Plan: THROMBECTOMY OF GREAT VESSEL WITH WOUND VAC DRESSING CHANGE (Left) POD #7 s/p left axillofemoral bypass for ischemic left leg   Continue Heparin gtt, transition to PO regimen early next week Will monitor foot and Left arm closely; monitor CBC. Continue supportive care Disposition planning   LOS: 8 days    Carla [name] A 07/30/2022

## 2022-07-30 NOTE — Progress Notes (Signed)
ANTICOAGULATION CONSULT NOTE  Pharmacy Consult for Heparin infusion Indication: DVT/Ischemic leg   Allergies  Allergen Reactions   Gabapentin     Tremors    Bactrim [Sulfamethoxazole-Trimethoprim] Itching    Patient Measurements: Height: '5\' 2"'$  (157.5 cm) Weight: 80.7 kg (178 lb) IBW/kg (Calculated) : 50.1 Heparin Dosing Weight: 68.1 kg   Vital Signs: Temp: 98.4 F (36.9 C) (10/08 0403) Temp Source: Oral (10/08 0024) BP: 123/59 (10/08 0403) Pulse Rate: 82 (10/08 0403)  Labs: Recent Labs    07/28/22 0629 07/28/22 1259 07/29/22 0501 07/29/22 1210 07/29/22 1214 07/30/22 0429  HGB 7.5*   < > 7.0* 6.9*  --  9.0*  HCT 23.5*   < > 22.4* 22.2*  --  27.1*  PLT 149*  --  155  --   --  198  HEPARINUNFRC 0.55  --  0.50  --  0.54 0.81*  CREATININE 0.64  --   --   --   --  0.59   < > = values in this interval not displayed.     Estimated Creatinine Clearance: 71.7 mL/min (by C-G formula based on SCr of 0.59 mg/dL).   Medical History: Past Medical History:  Diagnosis Date   Abdominal aortic atherosclerosis ([place])    a. 05/2017 CTA abd/pelvis: significant atherosclerotic dzs of the infrarenal abd Ao w/ some mural thrombus. No aneurysm or dissection.   Acute focal ischemia of small intestine (HCC)    Acute right-sided low back pain with right-sided sciatica 06/13/2017   AKI (acute kidney injury) ([place]) 07/29/2017   Anemia    Anginal pain ([place])    a. 08/2012 Lexiscan MV: EF 54%, non ischemia/infarct.   Anxiety and depression    Baker's cyst of knee, right    a. 07/2016 U/S: 4.1 x 1.4 x 2.9 cystic structure in R poplitetal fossa.   Bell's palsy    CAD (coronary artery disease)    CHF (congestive heart failure) ([place])    a.) TTE 06/20/2017: EF 60-65%, no rwma, G1DD, no source of cardiac emboli.   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 05/2017 CTA Abd/pelvis: apparent thrombus or embolus in prox SMA (70-90%); b. 05/2017 catheter directed tPA, mechanical thrombectomy, and stenting of the SMA.   Essential hypertension with goal blood pressure less than 140/90 01/19/2016   Gastroesophageal reflux disease 01/19/2016   GERD (gastroesophageal reflux disease)    H/O colectomy    History of 2019 novel coronavirus disease (COVID-19) 11/14/2019   History of kidney stones    Hyperlipidemia    Hypertension    Morbid obesity with BMI of 40.0-44.9, adult ([place])    NSTEMI (non-ST elevated myocardial infarction) ([place]) 06/27/2021   a.) high sensitivity troponins trended: 893 --> 1587 --> 1748 --> 868 --> 735 ng/L   Occlusive mesenteric ischemia ([place]) 06/19/2017   Osteoarthritis    PAD (peripheral artery disease) (HCC)    PAF (paroxysmal atrial fibrillation) (HCC)    a.) CHA2DS2-VASc = 6 (sex, CHF, HTN, DVT x2, aortic plaque). b.) rate/rhythm maintained on oral metoprolol tartrate; chronically anticoagulated with standard dose apixaban.   Palpitations    Peripheral edema    Personal history of other malignant neoplasm of skin 06/01/2016   Pneumonia    Pre-diabetes  Primary osteoarthritis of both knees 06/13/2017   SBO (small bowel obstruction) ([place]) 08/06/2017   Skin cancer of nose    Superior mesenteric artery thrombosis (HCC)    Vitamin D deficiency     Medications:  Medications Prior to Admission  Medication Sig Dispense Refill Last Dose   amLODipine (NORVASC) 5 MG tablet Take 5 mg by mouth daily.   07/21/2022 at 0800   apixaban (ELIQUIS) 5 MG TABS tablet Take 1 tablet (5 mg total) by mouth 2 (two) times daily. 60 tablet 11 07/21/2022 at 0800   aspirin EC 81 MG tablet Take 81 mg by mouth daily. Swallow whole.   07/21/2022 at 0800   atorvastatin (LIPITOR) 80 MG tablet Take 80 mg by mouth at bedtime.   07/21/2022 at 2000   citalopram (CELEXA) 40 MG tablet Take 1  tablet (40 mg total) by mouth daily. 30 tablet 2 07/21/2022 at 0800   COMBIVENT RESPIMAT 20-100 MCG/ACT AERS respimat    prn at prn   esomeprazole (NEXIUM) 20 MG capsule Take 20 mg by mouth daily.   07/21/2022 at 0800   ferrous sulfate 325 (65 FE) MG tablet Take 325 mg by mouth daily.   07/21/2022 at 0800   furosemide (LASIX) 40 MG tablet Take 60 mg by mouth daily.   07/21/2022 at 0800   isosorbide mononitrate (IMDUR) 60 MG 24 hr tablet Take 60 mg by mouth daily.   07/21/2022 at 0800   metoprolol tartrate (LOPRESSOR) 50 MG tablet Take 50 mg by mouth 2 (two) times daily.   07/21/2022 at 2000   Multiple Vitamin (MULTIVITAMIN WITH MINERALS) TABS tablet Take 1 tablet by mouth daily. 30 tablet 0 07/21/2022 at 0800   omeprazole (PRILOSEC) 20 MG capsule Take 20 mg by mouth 2 (two) times daily before a meal.   07/21/2022 at 2000   ondansetron (ZOFRAN) 4 MG tablet Take 4 mg by mouth every 8 (eight) hours as needed for nausea or vomiting.   prn at prn   oxyCODONE-acetaminophen (PERCOCET/ROXICET) 5-325 MG tablet Take 1 tablet by mouth every 6 (six) hours as needed for moderate pain. 40 tablet 0 prn at prn   pregabalin (LYRICA) 100 MG capsule Take 1 capsule (100 mg total) by mouth 3 (three) times daily. 90 capsule 3 07/21/2022 at 2000   feeding supplement (ENSURE ENLIVE / ENSURE PLUS) LIQD Take 237 mLs by mouth 2 (two) times daily between meals. 237 mL 12     Assessment: Pharmacy consulted to dose heparin in this 61 year old female admitted with ischemic leg.   Pt reports taking Eliquis 5 mg PO BID PTA with last dose on 9/29 @ 2000.    Documentation:  Critically elevated aPTT at 162 with correlating anti-xa level of >1.10 s/p thrombectomy on 10/1 at 1600. Vascular believes this may be secondary to heparin 8,000 unit bolus given at 1400 during procedure. Heparin infusion rate was increased from 1400 to 1800 units/hr. Instructed by vascular surgeon to continue heparin infusion rate as is. Vascular also requests that we  refrain from holding infusion. Goal heparin level increased to 0.7 - 1.0 units/mL. If heparin level supratherapeutic, only decrease rate.  10/6: Plt reman stable. Noted slight drop in hemoglobin after 1 unit PRBC given on 10/5. Vascula surgeon will like to keep patient on anticoagulation due to severe hypercoagulable state.   Goal of Therapy:  Heparin level 0.7 - 1.0 units/mL per vascular   Monitor platelets by anticoagulation protocol: Yes  Date/time HL/aPTT Comments 10/4'@1339'$  HL=  0.63 Therapeutic @ 1400 un/hr 10/5'@0359'$  HL = 0.66 Therapeutic @ 1400 un/hr 10/6'@0629'$  HL = 0.55 Therapeutic 10/7 0501 HL = 0.50 Subtherapeutic 10/7 1214 HL = 0.54 Subtherapeutic 10/8 0429 HL = 0.81 Therapeutic x 1  Plan:  Will continue heparin infusion at 1700 units/hr. Recheck heparin level in 6 hours to confirm.  CBC daily while heparin.   Renda Rolls, PharmD, [facility] 07/30/2022 5:50 AM

## 2022-07-31 DIAGNOSIS — I70222 Atherosclerosis of native arteries of extremities with rest pain, left leg: Secondary | ICD-10-CM | POA: Diagnosis not present

## 2022-07-31 LAB — HEPARIN LEVEL (UNFRACTIONATED)
Heparin Unfractionated: 0.52 IU/mL (ref 0.30–0.70)
Heparin Unfractionated: 0.64 IU/mL (ref 0.30–0.70)
Heparin Unfractionated: 0.97 IU/mL — ABNORMAL HIGH (ref 0.30–0.70)

## 2022-07-31 LAB — CBC
HCT: 25.1 % — ABNORMAL LOW (ref 36.0–46.0)
Hemoglobin: 7.9 g/dL — ABNORMAL LOW (ref 12.0–15.0)
MCH: 28.2 pg (ref 26.0–34.0)
MCHC: 31.5 g/dL (ref 30.0–36.0)
MCV: 89.6 fL (ref 80.0–100.0)
Platelets: 218 10*3/uL (ref 150–400)
RBC: 2.8 MIL/uL — ABNORMAL LOW (ref 3.87–5.11)
RDW: 17.3 % — ABNORMAL HIGH (ref 11.5–15.5)
WBC: 10.3 10*3/uL (ref 4.0–10.5)
nRBC: 0.5 % — ABNORMAL HIGH (ref 0.0–0.2)

## 2022-07-31 LAB — GLUCOSE, CAPILLARY
Glucose-Capillary: 121 mg/dL — ABNORMAL HIGH (ref 70–99)
Glucose-Capillary: 153 mg/dL — ABNORMAL HIGH (ref 70–99)
Glucose-Capillary: 151 mg/dL — ABNORMAL HIGH (ref 70–99)
Glucose-Capillary: 143 mg/dL — ABNORMAL HIGH (ref 70–99)
Glucose-Capillary: 160 mg/dL — ABNORMAL HIGH (ref 70–99)

## 2022-07-31 NOTE — Progress Notes (Signed)
PT Cancellation Note  Patient Details Name: Carla [name] MRN: [id number] DOB: 1961/07/31   Cancelled Treatment:     Therapist in just now to see pt, grand-daughter present offering encouragement to participate in sitting edge of bed, pt continuously refused, stating "she didn't feel like it". Will continue PT next available date/time as well as pt's willingness to participate.    [name] 07/31/2022, 1:16 PM

## 2022-07-31 NOTE — Progress Notes (Signed)
PROGRESS NOTE    Carla [name]   TDD:[id number] DOB: 12/22/1960  DOA: 07/22/2022 Date of Service: 07/31/22 PCP: [facility]     Brief Narrative / Hospital Course:  Carla [name] is a 61 y.o. female with medical history significant for peripheral arterial disease s/p right AKA, chronic diastolic dysfunction CHF, anxiety and depression, COPD, paroxysmal A-fib on chronic anticoagulation therapy, who presents to the ED with left leg pain for the past 1 week which had been progressively worsening.  09/30: CTA found with ischemic LLE. The patient was evaluated by vascular surgeon, Dr. Trula Slade in the ED who will take her to the OR on Sunday 10/1.  Patient started on heparin. Admitted to hospitalist service.  10/01: pt went to OR for common femoral artery bypass graft, post OR woke up c/o leg pain, mottled leg, went back to OR for thrombectomy of Left axillary femoral bypass graft with revision. Transferred to ICU.  10/02: Started on cefepime for concern for abscess of right BKA stump 10/03-10/04: Left upper extremity swelling noted overnight, ultrasound pending as of this AM --> no DVT but (+)hematoma. D/w vascular - no procedures planned for this (expected w/ anticoagulation) and ok to transfer to floor. Pain control has been a challenge but she is better w/ anxiolytics.  10/05: Afib w/ occasional rapid rate and hypotension.  Hemoglobin down to 6.2.  Transfused 2 units PRBC, VS stabilized.  10/06: Hemoglobin 9.0 posttransfusion, 7.5 this morning.  Follow H/H midday today Hgb 7.7, hold off on transfusion for now, CBC in AM. PT recommending SNF. 10/07: concern for thrombosis to toes, vascular following w/ pharmacy for heparin management. Transfusing another unit blood for Hgb 7.0  10/08: continues to c/o pain LUE and RLE. Will image RLE --> CT (+) Low-density fluid collection along the distal aspect of the right femoral amputation. This is nonspecific and could be sterile or infected.  No gas or  foreign body is seen associated with this collection. No evidence of osteomyelitis or hip joint effusion. 10/09: Remains on heparin per vascular. Pain control still a challenge    Consultants:  Vascular Surgery   Procedures: 10/01: Left axillary to common femoral artery bypass graft to treat ischemia 10/01: Thrombectomy of left axillary femoral bypass graft with revision  Antimicrobials:  Cefazolin x3 doses  Cefepime 10/2 - 10/9    ASSESSMENT & PLAN:   Principal Problem:   Critical limb ischemia of left lower extremity (HCC) Active Problems:   AKI (acute kidney injury) ([place])   PAF (paroxysmal atrial fibrillation) (HCC)   Chronic diastolic CHF (congestive heart failure) (HCC)   Abscess of knee, right   Hematoma   Critical limb ischemia of left lower extremity ([place]) Peripheral artery disease s/p right BKA S/p OR initially 10/01 for Left axillary to common femoral artery bypass graft, Prevena wound VAC placed .  Post surgery patient had left foot pain and mottled leg was taken to the OR again 10/01, thrombectomy of left axillofemoral bypass graft Continue on heparin per vascular surgery, home eliquis held (Hx Afib) Continuing aspirin, statin Pain control  Left upper extremity swelling Hematoma LUE Noted overnight 10/03-10/04 [place] LUE no DVT but there is large (6.6 x 6.3 x 6.5 cm (volume = 140 cm^3)) hematoma under the left axillary surgical site. D/w vascular - no procedures planned for this (expected w/ anticoagulation) and ok to transfer to floor Monitoring H/H see anemia  Acute blood loss anemia, surgical intervention, hematoma Status posttransfusion 2 units PRBC 07/27/2022,  1 unit PRBC 07/29/22  Serial H/H & CBC  Rt knee fluid collection concerning for abscess At site of amputation, found on CT concern for abscess vs non-infectious fluid collection Cefpime completed, has not helped - favor non-infectious process  Vascular following, avoiding  procedures Continues to c/o pain despite course of abx, 10/08 will get XR preliminary but expect will need CT --> XR ok 10/09: reviewed CT RLE (+) Low-density fluid collection along the distal aspect of the right femoral amputation. This is nonspecific and could be sterile or infected. No gas or  foreign body is seen associated with this collection. No evidence of osteomyelitis or hip joint effusion.   AKI - resolved Prerenal D/c'ed ivf Monitor BMP   Avoid nephrotoxic meds  PAF (paroxysmal atrial fibrillation) (HCC) Eliquis on hold for washout Continue metoprolol for rate control On Heparin for limb ischemia   Chronic diastolic CHF (congestive heart failure) (HCC) Clinically compensated Continue metoprolol and Imdur    DVT prophylaxis: n/a on heparin as above  Pertinent IV fluids/nutrition: diet to advance as tolerate  Central lines / invasive devices: none  Code Status: FULL CODE Family Communication: none at this time   Disposition: remains inpatient  TOC needs: will need SNF Barriers to discharge / significant pending items: vascular clearance, debility and needing SNF placement, anemia monitoring, on heparin, will be here at least few more days                Subjective:  Still significant LUE pain and RLE pain at stump, feeling weak stil but a bit better after transfusion yesterday. No CP/SOB.        Objective:  Vitals:   07/31/22 0034 07/31/22 0413 07/31/22 0743 07/31/22 1201  BP: (!) 107/57 (!) 110/55 (!) 110/59 (!) 130/59  Pulse: 79 77 75 86  Resp: '16 14 16 14  '$ Temp: 98.5 F (36.9 C) 98.1 F (36.7 C) 97.8 F (36.6 C) 99 F (37.2 C)  TempSrc:   Oral Oral  SpO2: 100% 98% 99% 99%  Weight:      Height:        Intake/Output Summary (Last 24 hours) at 07/31/2022 1430 Last data filed at 07/31/2022 1101 Gross per 24 hour  Intake 838.75 ml  Output 1951 ml  Net -1112.25 ml   Filed Weights   07/22/22 0029  Weight: 80.7 kg    Examination:   Constitutional:  VS as above General Appearance: alert, obese, ill appearing but NAD Respiratory: Normal respiratory effort No wheeze No rhonchi No rales Cardiovascular: Irreg irreg, reg rate  No rub/gallop auscultated Gastrointestinal: No tenderness Musculoskeletal:  No clubbing/cyanosis of digits Symmetrical movement in all extremities R BKA LUE (+)tenderness  INtact surgical site L upper chest but swollen, ecchymosis  Neurological: No cranial nerve deficit on limited exam Alert Psychiatric: Normal judgment/insight Depressed/anxious mood and affect       Scheduled Medications:   sodium chloride   Intravenous Once   aspirin EC  81 mg Oral Q0600   atorvastatin  80 mg Oral QHS   Chlorhexidine Gluconate Cloth  6 each Topical Daily   citalopram  40 mg Oral Daily   docusate sodium  100 mg Oral Daily   insulin aspart  0-15 Units Subcutaneous Q4H   isosorbide mononitrate  60 mg Oral Daily   pantoprazole  40 mg Oral Daily   pregabalin  100 mg Oral TID    Continuous Infusions:  sodium chloride     heparin 1,950 Units/hr (07/31/22 1428)  magnesium sulfate bolus IVPB      PRN Medications:  sodium chloride, acetaminophen **OR** acetaminophen, acetaminophen **OR** acetaminophen, alum & mag hydroxide-simeth, guaiFENesin-dextromethorphan, hydrALAZINE, HYDROcodone-acetaminophen, HYDROmorphone (DILAUDID) injection, labetalol, LORazepam, magnesium sulfate bolus IVPB, methocarbamol, metoprolol tartrate, ondansetron **OR** ondansetron (ZOFRAN) IV, ondansetron, mouth rinse, oxyCODONE, phenol, polyethylene glycol, potassium chloride  Antimicrobials:  Anti-infectives (From admission, onward)    Start     Dose/Rate Route Frequency Ordered Stop   07/27/22 1400  ceFEPIme (MAXIPIME) 2 g in sodium chloride 0.9 % 100 mL IVPB  Status:  Discontinued        2 g 200 mL/hr over 30 Minutes Intravenous Every 8 hours 07/27/22 1233 07/31/22 1405   07/25/22 2200  ceFEPIme (MAXIPIME) 2 g in  sodium chloride 0.9 % 100 mL IVPB  Status:  Discontinued        2 g 200 mL/hr over 30 Minutes Intravenous Every 12 hours 07/25/22 0730 07/27/22 1233   07/24/22 1200  ceFEPIme (MAXIPIME) 2 g in sodium chloride 0.9 % 100 mL IVPB  Status:  Discontinued        2 g 200 mL/hr over 30 Minutes Intravenous Every 8 hours 07/24/22 0957 07/25/22 0730   07/23/22 1630  ceFAZolin (ANCEF) IVPB 2g/100 mL premix        2 g 200 mL/hr over 30 Minutes Intravenous Every 8 hours 07/23/22 1227 07/23/22 2357       Data Reviewed: I have personally reviewed following labs and imaging studies  CBC: Recent Labs  Lab 07/27/22 0359 07/27/22 2252 07/28/22 0629 07/28/22 1259 07/29/22 0501 07/29/22 1210 07/30/22 0429 07/30/22 2006 07/31/22 0429  WBC 15.6*  --  14.1*  --  12.8*  --  12.3*  --  10.3  HGB 6.2*   < > 7.5*   < > 7.0* 6.9* 9.0* 7.7* 7.9*  HCT 20.0*   < > 23.5*   < > 22.4* 22.2* 27.1* 24.2* 25.1*  MCV 91.3  --  89.4  --  91.4  --  88.3  --  89.6  PLT 155  --  149*  --  155  --  198  --  218   < > = values in this interval not displayed.   Basic Metabolic Panel: Recent Labs  Lab 07/25/22 0354 07/26/22 0411 07/27/22 0359 07/28/22 0629 07/30/22 0429  NA 138 135 135 138 138  K 4.4 4.5 4.2 4.1 3.7  CL 111 110 108 109 107  CO2 21* 20* '22 26 28  '$ GLUCOSE 172* 134* 115* 99 106*  BUN 25* 31* '22 18 17  '$ CREATININE 1.29* 1.03* 0.76 0.64 0.59  CALCIUM 7.9* 7.7* 7.7* 7.9* 8.0*   GFR: Estimated Creatinine Clearance: 71.7 mL/min (by C-G formula based on SCr of 0.59 mg/dL). Liver Function Tests: No results for input(s): "AST", "ALT", "ALKPHOS", "BILITOT", "PROT", "ALBUMIN" in the last 168 hours.  No results for input(s): "LIPASE", "AMYLASE" in the last 168 hours. No results for input(s): "AMMONIA" in the last 168 hours. Coagulation Profile: No results for input(s): "INR", "PROTIME" in the last 168 hours.  Cardiac Enzymes: No results for input(s): "CKTOTAL", "CKMB", "CKMBINDEX", "TROPONINI" in the  last 168 hours. BNP (last 3 results) No results for input(s): "PROBNP" in the last 8760 hours. HbA1C: No results for input(s): "HGBA1C" in the last 72 hours.  CBG: Recent Labs  Lab 07/30/22 2120 07/30/22 2344 07/31/22 0422 07/31/22 0745 07/31/22 1202  GLUCAP 137* 127* 121* 153* 151*   Lipid Profile: No results for input(s): "CHOL", "HDL", "LDLCALC", "  TRIG", "CHOLHDL", "LDLDIRECT" in the last 72 hours. Thyroid Function Tests: No results for input(s): "TSH", "T4TOTAL", "FREET4", "T3FREE", "THYROIDAB" in the last 72 hours. Anemia Panel: No results for input(s): "VITAMINB12", "FOLATE", "FERRITIN", "TIBC", "IRON", "RETICCTPCT" in the last 72 hours. Urine analysis: N/a Sepsis Labs: '@LABRCNTIP'$ (procalcitonin:4,lacticidven:4)  Recent Results (from the past 240 hour(s))  MRSA Next Gen by PCR, Nasal     Status: Abnormal   Collection Time: 07/23/22 12:17 AM   Specimen: Nasal Mucosa; Nasal Swab  Result Value Ref Range Status   MRSA by PCR Next Gen DETECTED (A) NOT DETECTED Final    Comment: RESULT CALLED TO, READ BACK BY AND VERIFIED WITH: Clide Cliff @ 3500 on 07/23/2022 by CAF (NOTE) The GeneXpert MRSA Assay (FDA approved for NASAL specimens only), is one component of a comprehensive MRSA colonization surveillance program. It is not intended to diagnose MRSA infection nor to guide or monitor treatment for MRSA infections. Test performance is not FDA approved in patients less than 46 years old. Performed at [facility], [address] [place], [place] [address]          Radiology Studies: CT ANGIO AO+BIFEM W & OR WO CONTRAST  Result Date: 07/22/2022 CLINICAL DATA:  Arterial embolism lower extremity EXAM: CT ANGIOGRAPHY OF ABDOMINAL AORTA WITH ILIOFEMORAL RUNOFF TECHNIQUE: Multidetector CT imaging of the abdomen, pelvis and lower extremities was performed using the standard protocol during bolus administration of intravenous contrast. Multiplanar CT image  reconstructions and MIPs were obtained to evaluate the vascular anatomy. RADIATION DOSE REDUCTION: This exam was performed according to the departmental dose-optimization program which includes automated exposure control, adjustment of the mA and/or kV according to patient size and/or use of iterative reconstruction technique. CONTRAST:  139m OMNIPAQUE IOHEXOL 350 MG/ML SOLN COMPARISON:  None Available. CT abdomen and pelvis 03/21/2022 FINDINGS: VASCULAR Aorta: Aorto bi iliac stent is occluded. The aorta is otherwise normal in caliber. Celiac: Patent without dissection or aneurysm. SMA: The SMA stent is occluded at the origin. With distal reconstitution. Renals: Patent bilateral renal arteries without aneurysm or dissection. IMA: Not visualized and likely occluded. RIGHT Lower Extremity Inflow: The right common femoral artery stent is occluded with thrombus extending into the right external iliac artery. The right internal iliac artery is likely occluded at its origin with distal reconstitution. Reconstitution of the distal external iliac artery. Outflow: The common femoral, profunda femoral, and superficial femoral arteries are patent and likely supplied from collateral flow. Runoff: Not applicable.  Right above the knee amputation. LEFT Lower Extremity Inflow: The common iliac stent external iliac, and proximal internal iliac are occluded. Distal reconstitution of the internal iliac. Outflow: Reconstitution of the common femoral artery. The common femoral, profunda femoral, superficial femoral, and popliteal arteries are patent. Runoff: Three-vessel runoff is patent to a 12-13 cm above the ankle in the calf. At this point the vessels are nonopacified possibly due to slow flow though occlusion is not excluded. Veins: No obvious venous abnormality within the limitations of this arterial phase study. Review of the MIP images confirms the above findings. NON-VASCULAR Lower chest: No acute abnormality. Hepatobiliary:  Cholecystectomy. No suspicious focal hepatic lesion. No biliary dilation. Pancreas: Unremarkable. Spleen: Unremarkable. Adrenals/Urinary Tract: Adrenal glands are unremarkable. Kidneys are normal, without renal calculi, focal lesion, or hydronephrosis. Bladder is unremarkable. Stomach/Bowel: Distended stomach. Normal caliber large and small bowel. Colonic diverticulosis without diverticulitis. Herniation of small and large bowel into the large ventral abdominal wall hernia. No evidence of obstruction or strangulation. Bowel anastomosis is  present within the hernia sac. Lymphatic: No suspicious adenopathy. Reproductive: Hysterectomy.  No adnexal masses. Other: No free intraperitoneal fluid or gas. Musculoskeletal: Within the distal right thigh at the site of amputation there is a 5.1 x 4.0 cm fluid collection with peripheral enhancement. Edema within the left foot. No acute osseous abnormality. 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 Patent three-vessel runoff of the left lower extremity to the level of the calf where there is non opacification of the distal lower extremity arteries. This may be due to slow flow however occlusion is not excluded. Occluded SMA stent at the origin with distal reconstitution. NON-VASCULAR Right above-the-knee amputation. At the site of amputation there is a fluid collection concerning for abscess. Large ventral abdominal wall hernia containing nonobstructive loops of bowel. Emergent results were called by telephone at the time of interpretation on 07/22/2022 at 1:14 am to provider JADE SUNG , who verbally acknowledged these results. Electronically Signed   By: Placido Sou M.D.   On: 07/22/2022 01:27            LOS: 9 days       Emeterio Reeve, DO [facility] 07/31/2022, 2:30 PM   Staff may message me via secure chat in [place]  but this may not receive immediate  response,  please page for urgent matters!  If 7PM-7AM, please contact night-coverage [email or web address]  Dictation software was used to generate the above note. Typos may occur and escape review, as with typed/written notes. Please contact Dr Sheppard Coil directly for clarity if needed.

## 2022-07-31 NOTE — Progress Notes (Signed)
ANTICOAGULATION CONSULT NOTE  Pharmacy Consult for Heparin infusion Indication: DVT/Ischemic leg   Allergies  Allergen Reactions   Gabapentin     Tremors    Bactrim [Sulfamethoxazole-Trimethoprim] Itching    Patient Measurements: Height: '5\' 2"'$  (157.5 cm) Weight: 80.7 kg (178 lb) IBW/kg (Calculated) : 50.1 Heparin Dosing Weight: 68.1 kg   Vital Signs: Temp: 97.7 F (36.5 C) (10/09 1941) Temp Source: Oral (10/09 1941) BP: 118/57 (10/09 1941) Pulse Rate: 80 (10/09 1941)  Labs: Recent Labs    07/29/22 0501 07/29/22 1210 07/30/22 0429 07/30/22 1212 07/30/22 2006 07/31/22 0429 07/31/22 1241 07/31/22 1955  HGB 7.0*   < > 9.0*  --  7.7* 7.9*  --   --   HCT 22.4*   < > 27.1*  --  24.2* 25.1*  --   --   PLT 155  --  198  --   --  218  --   --   HEPARINUNFRC 0.50   < > 0.81*   < >  --  0.52 0.64 0.97*  CREATININE  --   --  0.59  --   --   --   --   --    < > = values in this interval not displayed.     Estimated Creatinine Clearance: 71.7 mL/min (by C-G formula based on SCr of 0.59 mg/dL).   Medical History: Past Medical History:  Diagnosis Date   Abdominal aortic atherosclerosis ([place])    a. 05/2017 CTA abd/pelvis: significant atherosclerotic dzs of the infrarenal abd Ao w/ some mural thrombus. No aneurysm or dissection.   Acute focal ischemia of small intestine (HCC)    Acute right-sided low back pain with right-sided sciatica 06/13/2017   AKI (acute kidney injury) ([place]) 07/29/2017   Anemia    Anginal pain ([place])    a. 08/2012 Lexiscan MV: EF 54%, non ischemia/infarct.   Anxiety and depression    Baker's cyst of knee, right    a. 07/2016 U/S: 4.1 x 1.4 x 2.9 cystic structure in R poplitetal fossa.   Bell's palsy    CAD (coronary artery disease)    CHF (congestive heart failure) ([place])    a.) TTE 06/20/2017: EF 60-65%, no rwma, G1DD, no source of cardiac emboli.   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 05/2017 CTA Abd/pelvis: apparent thrombus or embolus in prox SMA (70-90%); b. 05/2017 catheter directed tPA, mechanical thrombectomy, and stenting of the SMA.   Essential hypertension with goal blood pressure less than 140/90 01/19/2016   Gastroesophageal reflux disease 01/19/2016   GERD (gastroesophageal reflux disease)    H/O colectomy    History of 2019 novel coronavirus disease (COVID-19) 11/14/2019   History of kidney stones    Hyperlipidemia    Hypertension    Morbid obesity with BMI of 40.0-44.9, adult ([place])    NSTEMI (non-ST elevated myocardial infarction) ([place]) 06/27/2021   a.) high sensitivity troponins trended: 893 --> 1587 --> 1748 --> 868 --> 735 ng/L   Occlusive mesenteric ischemia ([place]) 06/19/2017   Osteoarthritis    PAD (peripheral artery disease) (HCC)    PAF (paroxysmal atrial fibrillation) (HCC)    a.) CHA2DS2-VASc = 6 (sex, CHF, HTN, DVT x2, aortic plaque). b.) rate/rhythm maintained on oral metoprolol  tartrate; chronically anticoagulated with standard dose apixaban.   Palpitations    Peripheral edema    Personal history of other malignant neoplasm of skin 06/01/2016   Pneumonia    Pre-diabetes    Primary osteoarthritis of both knees 06/13/2017   SBO (small bowel obstruction) ([place]) 08/06/2017   Skin cancer of nose    Superior mesenteric artery thrombosis (HCC)    Vitamin D deficiency     Medications:  Medications Prior to Admission  Medication Sig Dispense Refill Last Dose   amLODipine (NORVASC) 5 MG tablet Take 5 mg by mouth daily.   07/21/2022 at 0800   apixaban (ELIQUIS) 5 MG TABS tablet Take 1 tablet (5 mg total) by mouth 2 (two) times daily. 60 tablet 11 07/21/2022 at 0800   aspirin EC 81 MG tablet Take 81 mg by mouth daily. Swallow whole.   07/21/2022 at 0800   atorvastatin (LIPITOR) 80 MG tablet Take 80 mg by  mouth at bedtime.   07/21/2022 at 2000   citalopram (CELEXA) 40 MG tablet Take 1 tablet (40 mg total) by mouth daily. 30 tablet 2 07/21/2022 at 0800   COMBIVENT RESPIMAT 20-100 MCG/ACT AERS respimat    prn at prn   esomeprazole (NEXIUM) 20 MG capsule Take 20 mg by mouth daily.   07/21/2022 at 0800   ferrous sulfate 325 (65 FE) MG tablet Take 325 mg by mouth daily.   07/21/2022 at 0800   furosemide (LASIX) 40 MG tablet Take 60 mg by mouth daily.   07/21/2022 at 0800   isosorbide mononitrate (IMDUR) 60 MG 24 hr tablet Take 60 mg by mouth daily.   07/21/2022 at 0800   metoprolol tartrate (LOPRESSOR) 50 MG tablet Take 50 mg by mouth 2 (two) times daily.   07/21/2022 at 2000   Multiple Vitamin (MULTIVITAMIN WITH MINERALS) TABS tablet Take 1 tablet by mouth daily. 30 tablet 0 07/21/2022 at 0800   omeprazole (PRILOSEC) 20 MG capsule Take 20 mg by mouth 2 (two) times daily before a meal.   07/21/2022 at 2000   ondansetron (ZOFRAN) 4 MG tablet Take 4 mg by mouth every 8 (eight) hours as needed for nausea or vomiting.   prn at prn   oxyCODONE-acetaminophen (PERCOCET/ROXICET) 5-325 MG tablet Take 1 tablet by mouth every 6 (six) hours as needed for moderate pain. 40 tablet 0 prn at prn   pregabalin (LYRICA) 100 MG capsule Take 1 capsule (100 mg total) by mouth 3 (three) times daily. 90 capsule 3 07/21/2022 at 2000   feeding supplement (ENSURE ENLIVE / ENSURE PLUS) LIQD Take 237 mLs by mouth 2 (two) times daily between meals. 237 mL 12     Assessment: Pharmacy consulted to dose heparin in this 61 year old female admitted with ischemic leg.   Pt reports taking Eliquis 5 mg PO BID PTA with last dose on 9/29 @ 2000.    Documentation:  Critically elevated aPTT at 162 with correlating anti-xa level of >1.10 s/p thrombectomy on 10/1 at 1600. Vascular believes this may be secondary to heparin 8,000 unit bolus given at 1400 during procedure. Heparin infusion rate was increased from 1400 to 1800 units/hr. Instructed by vascular  surgeon to continue heparin infusion rate as is. Vascular also requests that we refrain from holding infusion. Goal heparin level increased to 0.7 - 1.0 units/mL due to severe hypercoagulable state. If heparin level supratherapeutic, only decrease rate.   Goal of Therapy:  Heparin level 0.7 - 1.0 units/mL per vascular   Monitor  platelets by anticoagulation protocol: Yes  Date/time HL/aPTT Comments 10/4'@1339'$  HL= 0.63 Therapeutic @ 1400 un/hr 10/5'@0359'$  HL = 0.66 Therapeutic @ 1400 un/hr 10/6'@0629'$  HL = 0.55 Therapeutic 10/7 0501 HL = 0.50 Subtherapeutic 10/7 1214 HL = 0.54 Subtherapeutic 10/8 0429 HL = 0.81 Therapeutic x 1 10/8 1212 HL = 0.71 Therapeutic x 2 10/9 0429 HL = 0.52 Subtherapeutic 10/9 1241 HL = 0.64 Subtherapeutic- ('@1850'$  un/hr) 10/9 1955 HL = 0.97  Therapeutic.    Plan:  Heparin level is therapeutic. Will continue heparin infusion at 1950 units/hr. Recheck heparin level and CBC with AM labs.   Eleonore Chiquito PharmD, BCPS 07/31/2022 9:05 PM

## 2022-07-31 NOTE — Plan of Care (Signed)
  Problem: Education: Goal: Knowledge of General Education information will improve Description: Including pain rating scale, medication(s)/side effects and non-pharmacologic comfort measures Outcome: Progressing   Problem: Health Behavior/Discharge Planning: Goal: Ability to manage health-related needs will improve Outcome: Progressing   Problem: Clinical Measurements: Goal: Ability to maintain clinical measurements within normal limits will improve Outcome: Progressing Goal: Will remain free from infection Outcome: Progressing Goal: Diagnostic test results will improve Outcome: Progressing Goal: Respiratory complications will improve Outcome: Progressing Goal: Cardiovascular complication will be avoided Outcome: Progressing   Problem: Activity: Goal: Risk for activity intolerance will decrease Outcome: Progressing   Problem: Nutrition: Goal: Adequate nutrition will be maintained Outcome: Progressing   Problem: Coping: Goal: Level of anxiety will decrease Outcome: Progressing   Problem: Elimination: Goal: Will not experience complications related to bowel motility Outcome: Progressing Goal: Will not experience complications related to urinary retention Outcome: Progressing   Problem: Pain Managment: Goal: General experience of comfort will improve Outcome: Progressing   Problem: Safety: Goal: Ability to remain free from injury will improve Outcome: Progressing   Problem: Skin Integrity: Goal: Risk for impaired skin integrity will decrease Outcome: Progressing   Problem: Education: Goal: Knowledge of prescribed regimen will improve Outcome: Progressing   Problem: Activity: Goal: Ability to tolerate increased activity will improve Outcome: Progressing   Problem: Bowel/Gastric: Goal: Gastrointestinal status for postoperative course will improve Outcome: Progressing   Problem: Clinical Measurements: Goal: Postoperative complications will be avoided or  minimized Outcome: Progressing Goal: Signs and symptoms of graft occlusion will improve Outcome: Progressing   Problem: Skin Integrity: Goal: Demonstration of wound healing without infection will improve Outcome: Progressing   Problem: Education: Goal: Ability to describe self-care measures that may prevent or decrease complications (Diabetes Survival Skills Education) will improve Outcome: Progressing Goal: Individualized Educational Video(s) Outcome: Progressing   Problem: Coping: Goal: Ability to adjust to condition or change in health will improve Outcome: Progressing   Problem: Fluid Volume: Goal: Ability to maintain a balanced intake and output will improve Outcome: Progressing   Problem: Health Behavior/Discharge Planning: Goal: Ability to identify and utilize available resources and services will improve Outcome: Progressing Goal: Ability to manage health-related needs will improve Outcome: Progressing   Problem: Metabolic: Goal: Ability to maintain appropriate glucose levels will improve Outcome: Progressing   Problem: Nutritional: Goal: Maintenance of adequate nutrition will improve Outcome: Progressing Goal: Progress toward achieving an optimal weight will improve Outcome: Progressing   Problem: Skin Integrity: Goal: Risk for impaired skin integrity will decrease Outcome: Progressing   Problem: Tissue Perfusion: Goal: Adequacy of tissue perfusion will improve Outcome: Progressing

## 2022-07-31 NOTE — Progress Notes (Signed)
ANTICOAGULATION CONSULT NOTE  Pharmacy Consult for Heparin infusion Indication: DVT/Ischemic leg   Allergies  Allergen Reactions   Gabapentin     Tremors    Bactrim [Sulfamethoxazole-Trimethoprim] Itching    Patient Measurements: Height: '5\' 2"'$  (157.5 cm) Weight: 80.7 kg (178 lb) IBW/kg (Calculated) : 50.1 Heparin Dosing Weight: 68.1 kg   Vital Signs: Temp: 98.1 F (36.7 C) (10/09 0413) BP: 110/55 (10/09 0413) Pulse Rate: 77 (10/09 0413)  Labs: Recent Labs    07/28/22 0629 07/28/22 1259 07/29/22 0501 07/29/22 1210 07/30/22 0429 07/30/22 1212 07/30/22 2006 07/31/22 0429  HGB 7.5*   < > 7.0*   < > 9.0*  --  7.7* 7.9*  HCT 23.5*   < > 22.4*   < > 27.1*  --  24.2* 25.1*  PLT 149*  --  155  --  198  --   --  218  HEPARINUNFRC 0.55  --  0.50   < > 0.81* 0.71*  --  0.52  CREATININE 0.64  --   --   --  0.59  --   --   --    < > = values in this interval not displayed.     Estimated Creatinine Clearance: 71.7 mL/min (by C-G formula based on SCr of 0.59 mg/dL).   Medical History: Past Medical History:  Diagnosis Date   Abdominal aortic atherosclerosis ([place])    a. 05/2017 CTA abd/pelvis: significant atherosclerotic dzs of the infrarenal abd Ao w/ some mural thrombus. No aneurysm or dissection.   Acute focal ischemia of small intestine (HCC)    Acute right-sided low back pain with right-sided sciatica 06/13/2017   AKI (acute kidney injury) ([place]) 07/29/2017   Anemia    Anginal pain ([place])    a. 08/2012 Lexiscan MV: EF 54%, non ischemia/infarct.   Anxiety and depression    Baker's cyst of knee, right    a. 07/2016 U/S: 4.1 x 1.4 x 2.9 cystic structure in R poplitetal fossa.   Bell's palsy    CAD (coronary artery disease)    CHF (congestive heart failure) ([place])    a.) TTE 06/20/2017: EF 60-65%, no rwma, G1DD, no source of cardiac emboli.   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 05/2017 CTA Abd/pelvis: apparent thrombus or embolus in prox SMA (70-90%); b. 05/2017 catheter directed tPA, mechanical thrombectomy, and stenting of the SMA.   Essential hypertension with goal blood pressure less than 140/90 01/19/2016   Gastroesophageal reflux disease 01/19/2016   GERD (gastroesophageal reflux disease)    H/O colectomy    History of 2019 novel coronavirus disease (COVID-19) 11/14/2019   History of kidney stones    Hyperlipidemia    Hypertension    Morbid obesity with BMI of 40.0-44.9, adult ([place])    NSTEMI (non-ST elevated myocardial infarction) ([place]) 06/27/2021   a.) high sensitivity troponins trended: 893 --> 1587 --> 1748 --> 868 --> 735 ng/L   Occlusive mesenteric ischemia ([place]) 06/19/2017   Osteoarthritis    PAD (peripheral artery disease) (HCC)    PAF (paroxysmal atrial fibrillation) (HCC)    a.) CHA2DS2-VASc = 6 (sex, CHF, HTN, DVT x2, aortic plaque). b.) rate/rhythm maintained on oral metoprolol tartrate; chronically anticoagulated with standard dose apixaban.   Palpitations  Peripheral edema    Personal history of other malignant neoplasm of skin 06/01/2016   Pneumonia    Pre-diabetes    Primary osteoarthritis of both knees 06/13/2017   SBO (small bowel obstruction) ([place]) 08/06/2017   Skin cancer of nose    Superior mesenteric artery thrombosis (HCC)    Vitamin D deficiency     Medications:  Medications Prior to Admission  Medication Sig Dispense Refill Last Dose   amLODipine (NORVASC) 5 MG tablet Take 5 mg by mouth daily.   07/21/2022 at 0800   apixaban (ELIQUIS) 5 MG TABS tablet Take 1 tablet (5 mg total) by mouth 2 (two) times daily. 60 tablet 11 07/21/2022 at 0800   aspirin EC 81 MG tablet Take 81 mg by mouth daily. Swallow whole.   07/21/2022 at 0800   atorvastatin (LIPITOR) 80 MG tablet Take 80 mg by mouth at bedtime.   07/21/2022 at  2000   citalopram (CELEXA) 40 MG tablet Take 1 tablet (40 mg total) by mouth daily. 30 tablet 2 07/21/2022 at 0800   COMBIVENT RESPIMAT 20-100 MCG/ACT AERS respimat    prn at prn   esomeprazole (NEXIUM) 20 MG capsule Take 20 mg by mouth daily.   07/21/2022 at 0800   ferrous sulfate 325 (65 FE) MG tablet Take 325 mg by mouth daily.   07/21/2022 at 0800   furosemide (LASIX) 40 MG tablet Take 60 mg by mouth daily.   07/21/2022 at 0800   isosorbide mononitrate (IMDUR) 60 MG 24 hr tablet Take 60 mg by mouth daily.   07/21/2022 at 0800   metoprolol tartrate (LOPRESSOR) 50 MG tablet Take 50 mg by mouth 2 (two) times daily.   07/21/2022 at 2000   Multiple Vitamin (MULTIVITAMIN WITH MINERALS) TABS tablet Take 1 tablet by mouth daily. 30 tablet 0 07/21/2022 at 0800   omeprazole (PRILOSEC) 20 MG capsule Take 20 mg by mouth 2 (two) times daily before a meal.   07/21/2022 at 2000   ondansetron (ZOFRAN) 4 MG tablet Take 4 mg by mouth every 8 (eight) hours as needed for nausea or vomiting.   prn at prn   oxyCODONE-acetaminophen (PERCOCET/ROXICET) 5-325 MG tablet Take 1 tablet by mouth every 6 (six) hours as needed for moderate pain. 40 tablet 0 prn at prn   pregabalin (LYRICA) 100 MG capsule Take 1 capsule (100 mg total) by mouth 3 (three) times daily. 90 capsule 3 07/21/2022 at 2000   feeding supplement (ENSURE ENLIVE / ENSURE PLUS) LIQD Take 237 mLs by mouth 2 (two) times daily between meals. 237 mL 12     Assessment: Pharmacy consulted to dose heparin in this 61 year old female admitted with ischemic leg.   Pt reports taking Eliquis 5 mg PO BID PTA with last dose on 9/29 @ 2000.    Documentation:  Critically elevated aPTT at 162 with correlating anti-xa level of >1.10 s/p thrombectomy on 10/1 at 1600. Vascular believes this may be secondary to heparin 8,000 unit bolus given at 1400 during procedure. Heparin infusion rate was increased from 1400 to 1800 units/hr. Instructed by vascular surgeon to continue heparin  infusion rate as is. Vascular also requests that we refrain from holding infusion. Goal heparin level increased to 0.7 - 1.0 units/mL due to severe hypercoagulable state. If heparin level supratherapeutic, only decrease rate.  10/8: Hgb, Hct, PLT trending up.  Goal of Therapy:  Heparin level 0.7 - 1.0 units/mL per vascular   Monitor platelets by anticoagulation protocol: Yes  Date/time  HL/aPTT Comments 10/4'@1339'$  HL= 0.63 Therapeutic @ 1400 un/hr 10/5'@0359'$  HL = 0.66 Therapeutic @ 1400 un/hr 10/6'@0629'$  HL = 0.55 Therapeutic 10/7 0501 HL = 0.50 Subtherapeutic 10/7 1214 HL = 0.54 Subtherapeutic 10/8 0429 HL = 0.81 Therapeutic x 1 10/8 1212 HL = 0.71 Therapeutic x 2 10/9 0429 HL = 0.52 Subtherapeutic  Plan:  Increase heparin infusion to 1850 units/hr. Recheck Recheck HL in 6 hrs after rate change.  Monitor CBC daily while heparin.   Renda Rolls, PharmD, [facility] 07/31/2022 5:46 AM

## 2022-07-31 NOTE — Progress Notes (Signed)
OT Cancellation Note  Patient Details Name: Carla [name] MRN: [id number] DOB: May 28, 1961   Cancelled Treatment:    Reason Eval/Treat Not Completed: Patient declined, no reason specified. Pt declining therapy today. Will re-attempt OT tx next date.   Ardeth Perfect., MPH, MS, OTR/L ascom [phone] 07/31/22, 1:30 PM

## 2022-07-31 NOTE — Progress Notes (Signed)
[facility]  Daily Progress Note   Subjective  -   Left arm is hurting and said it got hurt trying to move her today.  Very upset over this.  Left foot is doing okay.  Still having pain in her right stump.  Objective Vitals:   07/31/22 0413 07/31/22 0743 07/31/22 1201 07/31/22 1634  BP: (!) 110/55 (!) 110/59 (!) 130/59 (!) 146/77  Pulse: 77 75 86 77  Resp: '14 16 14 16  '$ Temp: 98.1 F (36.7 C) 97.8 F (36.6 C) 99 F (37.2 C) 98.3 F (36.8 C)  TempSrc:  Oral Oral Oral  SpO2: 98% 99% 99% 100%  Weight:      Height:        Intake/Output Summary (Last 24 hours) at 07/31/2022 1648 Last data filed at 07/31/2022 1101 Gross per 24 hour  Intake 838.75 ml  Output 1951 ml  Net -1112.25 ml    PULM  CTAB CV  RRR VASC  stable hematoma in the left axillary site.  1-2+ left upper extremity edema which is stable.  Left arm is warm.  Left foot is warm with good capillary refill.  Laboratory CBC    Component Value Date/Time   WBC 10.3 07/31/2022 0429   HGB 7.9 (L) 07/31/2022 0429   HGB 13.4 02/12/2015 1137   HCT 25.1 (L) 07/31/2022 0429   HCT 40.9 02/12/2015 1137   PLT 218 07/31/2022 0429   PLT 272 02/12/2015 1137    BMET    Component Value Date/Time   NA 138 07/30/2022 0429   NA 140 02/12/2015 1137   K 3.7 07/30/2022 0429   K 2.9 (L) 02/12/2015 1137   CL 107 07/30/2022 0429   CL 107 02/12/2015 1137   CO2 28 07/30/2022 0429   CO2 27 02/12/2015 1137   GLUCOSE 106 (H) 07/30/2022 0429   GLUCOSE 120 (H) 02/12/2015 1137   BUN 17 07/30/2022 0429   BUN 15 02/12/2015 1137   CREATININE 0.59 07/30/2022 0429   CREATININE 0.78 02/12/2015 1137   CALCIUM 8.0 (L) 07/30/2022 0429   CALCIUM 9.0 02/12/2015 1137   GFRNONAA >60 07/30/2022 0429   GFRNONAA >60 02/12/2015 1137   GFRAA >60 11/23/2019 1330   GFRAA >60 02/12/2015 1137    Assessment/Planning: POD #8 s/p left ax fem bypass for ischemic leg  Needs PT/OT and mobilization Elevate left arm and use it to  reduce swelling Will likely need rehab, very deconditioned.   [name]  07/31/2022, 4:48 PM

## 2022-07-31 NOTE — TOC Progression Note (Signed)
Transition of Care (TOC) - Progression Note    Patient Details  Name: Carla [name] MRN: [id number] Date of Birth: 07/08/1961  Transition of Care (TOC) CM/SW Contact  Ashley M Hill, LCSW Phone Number: 07/31/2022, 11:38 AM  Clinical Narrative:     CSW met with patient at bedside, patient.  reports she still refuses SNF at this time and would like to plan to go home with HH at discharge. Reports daughter is support and requested CSW call daughter [name].   CSW called [name], no answer lvm.   Per chart review patient lives with her daughter [name] in [place] the [place] [address] [place]  [id number].    Patient has a hospital bed, wheelchair, and bedside commode at home.   TOC will continue to follow for needs.   Expected Discharge Plan: Home w Home Health Services Barriers to Discharge: Continued Medical Work up  Expected Discharge Plan and Services Expected Discharge Plan: Home w Home Health Services   Discharge Planning Services: CM Consult   Living arrangements for the past 2 months: Single Family Home                 DME Arranged: N/A DME Agency: NA                   Social Determinants of Health (SDOH) Interventions    Readmission Risk Interventions    07/25/2022   10:49 AM 05/05/2022   10:52 AM 03/24/2022    9:51 AM  Readmission Risk Prevention Plan  Transportation Screening Complete Complete Complete  PCP or Specialist Appt within 3-5 Days   Complete  HRI or Home Care Consult   Complete  Social Work Consult for Recovery Care Planning/Counseling   Complete  Palliative Care Screening   Not Applicable  Medication Review (RN Care Manager) Complete Complete Complete  PCP or Specialist appointment within 3-5 days of discharge Complete Complete   HRI or Home Care Consult Complete    SW Recovery Care/Counseling Consult Not Complete Complete   SW Consult Not Complete Comments NA    Palliative Care Screening  Not Applicable    Skilled Nursing Facility Patient Refused Patient Refused     

## 2022-07-31 NOTE — Progress Notes (Signed)
ANTICOAGULATION CONSULT NOTE  Pharmacy Consult for Heparin infusion Indication: DVT/Ischemic leg   Allergies  Allergen Reactions   Gabapentin     Tremors    Bactrim [Sulfamethoxazole-Trimethoprim] Itching    Patient Measurements: Height: '5\' 2"'$  (157.5 cm) Weight: 80.7 kg (178 lb) IBW/kg (Calculated) : 50.1 Heparin Dosing Weight: 68.1 kg   Vital Signs: Temp: 99 F (37.2 C) (10/09 1201) Temp Source: Oral (10/09 1201) BP: 130/59 (10/09 1201) Pulse Rate: 86 (10/09 1201)  Labs: Recent Labs    07/29/22 0501 07/29/22 1210 07/30/22 0429 07/30/22 1212 07/30/22 2006 07/31/22 0429 07/31/22 1241  HGB 7.0*   < > 9.0*  --  7.7* 7.9*  --   HCT 22.4*   < > 27.1*  --  24.2* 25.1*  --   PLT 155  --  198  --   --  218  --   HEPARINUNFRC 0.50   < > 0.81* 0.71*  --  0.52 0.64  CREATININE  --   --  0.59  --   --   --   --    < > = values in this interval not displayed.     Estimated Creatinine Clearance: 71.7 mL/min (by C-G formula based on SCr of 0.59 mg/dL).   Medical History: Past Medical History:  Diagnosis Date   Abdominal aortic atherosclerosis ([place])    a. 05/2017 CTA abd/pelvis: significant atherosclerotic dzs of the infrarenal abd Ao w/ some mural thrombus. No aneurysm or dissection.   Acute focal ischemia of small intestine (HCC)    Acute right-sided low back pain with right-sided sciatica 06/13/2017   AKI (acute kidney injury) ([place]) 07/29/2017   Anemia    Anginal pain ([place])    a. 08/2012 Lexiscan MV: EF 54%, non ischemia/infarct.   Anxiety and depression    Baker's cyst of knee, right    a. 07/2016 U/S: 4.1 x 1.4 x 2.9 cystic structure in R poplitetal fossa.   Bell's palsy    CAD (coronary artery disease)    CHF (congestive heart failure) ([place])    a.) TTE 06/20/2017: EF 60-65%, no rwma, G1DD, no source of cardiac emboli.   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 05/2017 CTA Abd/pelvis: apparent thrombus or embolus in prox SMA (70-90%); b. 05/2017 catheter directed tPA, mechanical thrombectomy, and stenting of the SMA.   Essential hypertension with goal blood pressure less than 140/90 01/19/2016   Gastroesophageal reflux disease 01/19/2016   GERD (gastroesophageal reflux disease)    H/O colectomy    History of 2019 novel coronavirus disease (COVID-19) 11/14/2019   History of kidney stones    Hyperlipidemia    Hypertension    Morbid obesity with BMI of 40.0-44.9, adult ([place])    NSTEMI (non-ST elevated myocardial infarction) ([place]) 06/27/2021   a.) high sensitivity troponins trended: 893 --> 1587 --> 1748 --> 868 --> 735 ng/L   Occlusive mesenteric ischemia ([place]) 06/19/2017   Osteoarthritis    PAD (peripheral artery disease) (HCC)    PAF (paroxysmal atrial fibrillation) (HCC)    a.) CHA2DS2-VASc = 6 (sex, CHF, HTN, DVT x2, aortic plaque). b.) rate/rhythm maintained on oral metoprolol tartrate; chronically anticoagulated with standard dose apixaban.   Palpitations    Peripheral edema  Personal history of other malignant neoplasm of skin 06/01/2016   Pneumonia    Pre-diabetes    Primary osteoarthritis of both knees 06/13/2017   SBO (small bowel obstruction) ([place]) 08/06/2017   Skin cancer of nose    Superior mesenteric artery thrombosis (HCC)    Vitamin D deficiency     Medications:  Medications Prior to Admission  Medication Sig Dispense Refill Last Dose   amLODipine (NORVASC) 5 MG tablet Take 5 mg by mouth daily.   07/21/2022 at 0800   apixaban (ELIQUIS) 5 MG TABS tablet Take 1 tablet (5 mg total) by mouth 2 (two) times daily. 60 tablet 11 07/21/2022 at 0800   aspirin EC 81 MG tablet Take 81 mg by mouth daily. Swallow whole.   07/21/2022 at 0800   atorvastatin (LIPITOR) 80 MG tablet Take 80 mg by mouth at bedtime.   07/21/2022 at 2000    citalopram (CELEXA) 40 MG tablet Take 1 tablet (40 mg total) by mouth daily. 30 tablet 2 07/21/2022 at 0800   COMBIVENT RESPIMAT 20-100 MCG/ACT AERS respimat    prn at prn   esomeprazole (NEXIUM) 20 MG capsule Take 20 mg by mouth daily.   07/21/2022 at 0800   ferrous sulfate 325 (65 FE) MG tablet Take 325 mg by mouth daily.   07/21/2022 at 0800   furosemide (LASIX) 40 MG tablet Take 60 mg by mouth daily.   07/21/2022 at 0800   isosorbide mononitrate (IMDUR) 60 MG 24 hr tablet Take 60 mg by mouth daily.   07/21/2022 at 0800   metoprolol tartrate (LOPRESSOR) 50 MG tablet Take 50 mg by mouth 2 (two) times daily.   07/21/2022 at 2000   Multiple Vitamin (MULTIVITAMIN WITH MINERALS) TABS tablet Take 1 tablet by mouth daily. 30 tablet 0 07/21/2022 at 0800   omeprazole (PRILOSEC) 20 MG capsule Take 20 mg by mouth 2 (two) times daily before a meal.   07/21/2022 at 2000   ondansetron (ZOFRAN) 4 MG tablet Take 4 mg by mouth every 8 (eight) hours as needed for nausea or vomiting.   prn at prn   oxyCODONE-acetaminophen (PERCOCET/ROXICET) 5-325 MG tablet Take 1 tablet by mouth every 6 (six) hours as needed for moderate pain. 40 tablet 0 prn at prn   pregabalin (LYRICA) 100 MG capsule Take 1 capsule (100 mg total) by mouth 3 (three) times daily. 90 capsule 3 07/21/2022 at 2000   feeding supplement (ENSURE ENLIVE / ENSURE PLUS) LIQD Take 237 mLs by mouth 2 (two) times daily between meals. 237 mL 12     Assessment: Pharmacy consulted to dose heparin in this 61 year old female admitted with ischemic leg.   Pt reports taking Eliquis 5 mg PO BID PTA with last dose on 9/29 @ 2000.    Documentation:  Critically elevated aPTT at 162 with correlating anti-xa level of >1.10 s/p thrombectomy on 10/1 at 1600. Vascular believes this may be secondary to heparin 8,000 unit bolus given at 1400 during procedure. Heparin infusion rate was increased from 1400 to 1800 units/hr. Instructed by vascular surgeon to continue heparin infusion  rate as is. Vascular also requests that we refrain from holding infusion. Goal heparin level increased to 0.7 - 1.0 units/mL due to severe hypercoagulable state. If heparin level supratherapeutic, only decrease rate.   Goal of Therapy:  Heparin level 0.7 - 1.0 units/mL per vascular   Monitor platelets by anticoagulation protocol: Yes  Date/time HL/aPTT Comments 10/4'@1339'$  HL= 0.63 Therapeutic @ 1400 un/hr 10/5'@0359'$  HL =  0.66 Therapeutic @ 1400 un/hr 10/6'@0629'$  HL = 0.55 Therapeutic 10/7 0501 HL = 0.50 Subtherapeutic 10/7 1214 HL = 0.54 Subtherapeutic 10/8 0429 HL = 0.81 Therapeutic x 1 10/8 1212 HL = 0.71 Therapeutic x 2 10/9 0429 HL = 0.52 Subtherapeutic 10/9 1241 HL = 0.64 Subtherapeutic- ('@1850'$  un/hr)   Plan:  Increase heparin infusion to 1950 units/hr. Recheck Recheck HL in 6 hrs after rate change.  Monitor CBC daily while heparin.   Denys Salinger Rodriguez-Guzman PharmD, BCPS 07/31/2022 1:47 PM

## 2022-08-01 DIAGNOSIS — I480 Paroxysmal atrial fibrillation: Secondary | ICD-10-CM | POA: Diagnosis not present

## 2022-08-01 DIAGNOSIS — N179 Acute kidney failure, unspecified: Secondary | ICD-10-CM | POA: Diagnosis not present

## 2022-08-01 DIAGNOSIS — I5032 Chronic diastolic (congestive) heart failure: Secondary | ICD-10-CM | POA: Diagnosis not present

## 2022-08-01 DIAGNOSIS — I70222 Atherosclerosis of native arteries of extremities with rest pain, left leg: Secondary | ICD-10-CM | POA: Diagnosis not present

## 2022-08-01 LAB — HEPARIN LEVEL (UNFRACTIONATED): Heparin Unfractionated: 1.07 IU/mL — ABNORMAL HIGH (ref 0.30–0.70)

## 2022-08-01 LAB — CBC
HCT: 24.8 % — ABNORMAL LOW (ref 36.0–46.0)
Hemoglobin: 7.6 g/dL — ABNORMAL LOW (ref 12.0–15.0)
MCH: 28.1 pg (ref 26.0–34.0)
MCHC: 30.6 g/dL (ref 30.0–36.0)
MCV: 91.9 fL (ref 80.0–100.0)
Platelets: 232 10*3/uL (ref 150–400)
RBC: 2.7 MIL/uL — ABNORMAL LOW (ref 3.87–5.11)
RDW: 17.5 % — ABNORMAL HIGH (ref 11.5–15.5)
WBC: 9.5 10*3/uL (ref 4.0–10.5)
nRBC: 0.5 % — ABNORMAL HIGH (ref 0.0–0.2)

## 2022-08-01 LAB — GLUCOSE, CAPILLARY
Glucose-Capillary: 108 mg/dL — ABNORMAL HIGH (ref 70–99)
Glucose-Capillary: 116 mg/dL — ABNORMAL HIGH (ref 70–99)
Glucose-Capillary: 121 mg/dL — ABNORMAL HIGH (ref 70–99)
Glucose-Capillary: 145 mg/dL — ABNORMAL HIGH (ref 70–99)
Glucose-Capillary: 150 mg/dL — ABNORMAL HIGH (ref 70–99)
Glucose-Capillary: 162 mg/dL — ABNORMAL HIGH (ref 70–99)

## 2022-08-01 LAB — BASIC METABOLIC PANEL
Anion gap: 6 (ref 5–15)
BUN: 18 mg/dL (ref 8–23)
CO2: 30 mmol/L (ref 22–32)
Calcium: 8.3 mg/dL — ABNORMAL LOW (ref 8.9–10.3)
Chloride: 103 mmol/L (ref 98–111)
Creatinine, Ser: 0.5 mg/dL (ref 0.44–1.00)
GFR, Estimated: >60 mL/min (ref >60)
Glucose, Bld: 117 mg/dL — ABNORMAL HIGH (ref 70–99)
Potassium: 4 mmol/L (ref 3.5–5.1)
Sodium: 139 mmol/L (ref 135–145)

## 2022-08-01 MED ORDER — HYDROCODONE-ACETAMINOPHEN 5-325 MG PO TABS
2.0000 | ORAL_TABLET | ORAL | Status: AC
  Administered 2022-08-01 – 2022-08-02 (×6): 2 via ORAL
  Filled 2022-08-01 (×6): qty 2

## 2022-08-01 MED ORDER — HYDROMORPHONE HCL 1 MG/ML IJ SOLN
1.0000 mg | INTRAMUSCULAR | Status: DC | PRN
  Administered 2022-08-02: 1 mg via INTRAVENOUS
  Filled 2022-08-01: qty 1

## 2022-08-01 MED ORDER — PREGABALIN 75 MG PO CAPS
150.0000 mg | ORAL_CAPSULE | Freq: Three times a day (TID) | ORAL | Status: DC
  Administered 2022-08-01 – 2022-08-12 (×33): 150 mg via ORAL
  Filled 2022-08-01 (×33): qty 2

## 2022-08-01 MED ORDER — APIXABAN 5 MG PO TABS
10.0000 mg | ORAL_TABLET | Freq: Two times a day (BID) | ORAL | Status: AC
  Administered 2022-08-01 – 2022-08-08 (×14): 10 mg via ORAL
  Filled 2022-08-01 (×14): qty 2

## 2022-08-01 MED ORDER — APIXABAN 5 MG PO TABS
5.0000 mg | ORAL_TABLET | Freq: Two times a day (BID) | ORAL | Status: DC
  Administered 2022-08-08 – 2022-08-12 (×8): 5 mg via ORAL
  Filled 2022-08-01 (×8): qty 1

## 2022-08-01 NOTE — Progress Notes (Signed)
[place] for Eliquis Indication: DVT/Ischemic leg   Allergies  Allergen Reactions   Gabapentin     Tremors    Bactrim [Sulfamethoxazole-Trimethoprim] Itching    Patient Measurements: Height: '5\' 2"'$  (157.5 cm) Weight: 80.7 kg (178 lb) IBW/kg (Calculated) : 50.1 Heparin Dosing Weight: 68.1 kg   Vital Signs: Temp: 98.7 F (37.1 C) (10/10 1204) Temp Source: Oral (10/10 1204) BP: 111/59 (10/10 1204) Pulse Rate: 88 (10/10 1204)  Labs: Recent Labs    07/30/22 0429 07/30/22 1212 07/30/22 2006 07/31/22 0429 07/31/22 1241 07/31/22 1955 08/01/22 0559  HGB 9.0*  --  7.7* 7.9*  --   --  7.6*  HCT 27.1*  --  24.2* 25.1*  --   --  24.8*  PLT 198  --   --  218  --   --  232  HEPARINUNFRC 0.81*   < >  --  0.52 0.64 0.97* 1.07*  CREATININE 0.59  --   --   --   --   --  0.50   < > = values in this interval not displayed.    Estimated Creatinine Clearance: 71.7 mL/min (by C-G formula based on SCr of 0.5 mg/dL).  Medical History: Past Medical History:  Diagnosis Date   Abdominal aortic atherosclerosis ([place])    a. 05/2017 CTA abd/pelvis: significant atherosclerotic dzs of the infrarenal abd Ao w/ some mural thrombus. No aneurysm or dissection.   Acute focal ischemia of small intestine (HCC)    Acute right-sided low back pain with right-sided sciatica 06/13/2017   AKI (acute kidney injury) ([place]) 07/29/2017   Anemia    Anginal pain ([place])    a. 08/2012 Lexiscan MV: EF 54%, non ischemia/infarct.   Anxiety and depression    Baker's cyst of knee, right    a. 07/2016 U/S: 4.1 x 1.4 x 2.9 cystic structure in R poplitetal fossa.   Bell's palsy    CAD (coronary artery disease)    CHF (congestive heart failure) ([place])    a.) TTE 06/20/2017: EF 60-65%, no rwma, G1DD, no source of cardiac emboli.   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 05/2017 CTA Abd/pelvis: apparent thrombus or embolus in prox SMA (70-90%); b. 05/2017 catheter directed tPA, mechanical thrombectomy, and stenting of the SMA.   Essential hypertension with goal blood pressure less than 140/90 01/19/2016   Gastroesophageal reflux disease 01/19/2016   GERD (gastroesophageal reflux disease)    H/O colectomy    History of 2019 novel coronavirus disease (COVID-19) 11/14/2019   History of kidney stones    Hyperlipidemia    Hypertension    Morbid obesity with BMI of 40.0-44.9, adult ([place])    NSTEMI (non-ST elevated myocardial infarction) ([place]) 06/27/2021   a.) high sensitivity troponins trended: 893 --> 1587 --> 1748 --> 868 --> 735 ng/L   Occlusive mesenteric ischemia ([place]) 06/19/2017   Osteoarthritis    PAD (peripheral artery disease) (HCC)    PAF (paroxysmal atrial fibrillation) (HCC)    a.) CHA2DS2-VASc = 6 (sex, CHF, HTN, DVT x2, aortic plaque). b.) rate/rhythm maintained on oral metoprolol tartrate; chronically anticoagulated with standard dose apixaban.   Palpitations    Peripheral edema    Personal history of other malignant neoplasm of  skin 06/01/2016   Pneumonia    Pre-diabetes    Primary osteoarthritis of both knees 06/13/2017   SBO (small bowel obstruction) ([place]) 08/06/2017   Skin cancer of nose    Superior mesenteric artery thrombosis (HCC)    Vitamin D deficiency     Medications:  Medications Prior to Admission  Medication Sig Dispense Refill Last Dose   amLODipine (NORVASC) 5 MG tablet Take 5 mg by mouth daily.   07/21/2022 at 0800   apixaban (ELIQUIS) 5 MG TABS tablet Take 1 tablet (5 mg total) by mouth 2 (two) times daily. 60 tablet 11 07/21/2022 at 0800   aspirin EC 81 MG tablet Take 81 mg by mouth daily. Swallow whole.   07/21/2022 at 0800   atorvastatin (LIPITOR) 80 MG tablet Take 80 mg by mouth at bedtime.   07/21/2022 at 2000    citalopram (CELEXA) 40 MG tablet Take 1 tablet (40 mg total) by mouth daily. 30 tablet 2 07/21/2022 at 0800   COMBIVENT RESPIMAT 20-100 MCG/ACT AERS respimat    prn at prn   esomeprazole (NEXIUM) 20 MG capsule Take 20 mg by mouth daily.   07/21/2022 at 0800   ferrous sulfate 325 (65 FE) MG tablet Take 325 mg by mouth daily.   07/21/2022 at 0800   furosemide (LASIX) 40 MG tablet Take 60 mg by mouth daily.   07/21/2022 at 0800   isosorbide mononitrate (IMDUR) 60 MG 24 hr tablet Take 60 mg by mouth daily.   07/21/2022 at 0800   metoprolol tartrate (LOPRESSOR) 50 MG tablet Take 50 mg by mouth 2 (two) times daily.   07/21/2022 at 2000   Multiple Vitamin (MULTIVITAMIN WITH MINERALS) TABS tablet Take 1 tablet by mouth daily. 30 tablet 0 07/21/2022 at 0800   omeprazole (PRILOSEC) 20 MG capsule Take 20 mg by mouth 2 (two) times daily before a meal.   07/21/2022 at 2000   ondansetron (ZOFRAN) 4 MG tablet Take 4 mg by mouth every 8 (eight) hours as needed for nausea or vomiting.   prn at prn   oxyCODONE-acetaminophen (PERCOCET/ROXICET) 5-325 MG tablet Take 1 tablet by mouth every 6 (six) hours as needed for moderate pain. 40 tablet 0 prn at prn   pregabalin (LYRICA) 100 MG capsule Take 1 capsule (100 mg total) by mouth 3 (three) times daily. 90 capsule 3 07/21/2022 at 2000   feeding supplement (ENSURE ENLIVE / ENSURE PLUS) LIQD Take 237 mLs by mouth 2 (two) times daily between meals. 237 mL 12     Assessment: Pharmacy consulted to dose heparin in this 61 year old female admitted with ischemic leg.   Pt reports taking Eliquis 5 mg PO BID PTA with last dose on 9/29 @ 2000.    Per Vascular Surgery note on 10/4 "Given that the patient has previously had a thrombotic event would recommend transitioning with loading dose of 10 mg twice daily before transitioning to 5 mg."   Plan:  DC Heparin infusion and follow up Start Eliquis '10mg'$  BID x 7 days, then '5mg'$  BID  Denzell Colasanti Rodriguez-Guzman PharmD, BCPS 08/01/2022 1:09  PM

## 2022-08-01 NOTE — Progress Notes (Signed)
ANTICOAGULATION CONSULT NOTE  Pharmacy Consult for Heparin infusion Indication: DVT/Ischemic leg   Allergies  Allergen Reactions   Gabapentin     Tremors    Bactrim [Sulfamethoxazole-Trimethoprim] Itching    Patient Measurements: Height: '5\' 2"'$  (157.5 cm) Weight: 80.7 kg (178 lb) IBW/kg (Calculated) : 50.1 Heparin Dosing Weight: 68.1 kg   Vital Signs: Temp: 98.4 F (36.9 C) (10/10 0525) Temp Source: Oral (10/10 0525) BP: 113/53 (10/10 0525) Pulse Rate: 80 (10/10 0525)  Labs: Recent Labs    07/30/22 0429 07/30/22 1212 07/30/22 2006 07/31/22 0429 07/31/22 1241 07/31/22 1955 08/01/22 0559  HGB 9.0*  --  7.7* 7.9*  --   --  7.6*  HCT 27.1*  --  24.2* 25.1*  --   --  24.8*  PLT 198  --   --  218  --   --  232  HEPARINUNFRC 0.81*   < >  --  0.52 0.64 0.97* 1.07*  CREATININE 0.59  --   --   --   --   --  0.50   < > = values in this interval not displayed.    Estimated Creatinine Clearance: 71.7 mL/min (by C-G formula based on SCr of 0.5 mg/dL).  Medical History: Past Medical History:  Diagnosis Date   Abdominal aortic atherosclerosis ([place])    a. 05/2017 CTA abd/pelvis: significant atherosclerotic dzs of the infrarenal abd Ao w/ some mural thrombus. No aneurysm or dissection.   Acute focal ischemia of small intestine (HCC)    Acute right-sided low back pain with right-sided sciatica 06/13/2017   AKI (acute kidney injury) ([place]) 07/29/2017   Anemia    Anginal pain ([place])    a. 08/2012 Lexiscan MV: EF 54%, non ischemia/infarct.   Anxiety and depression    Baker's cyst of knee, right    a. 07/2016 U/S: 4.1 x 1.4 x 2.9 cystic structure in R poplitetal fossa.   Bell's palsy    CAD (coronary artery disease)    CHF (congestive heart failure) ([place])    a.) TTE 06/20/2017: EF 60-65%, no rwma, G1DD, no source of cardiac emboli.   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 05/2017 CTA Abd/pelvis: apparent thrombus or embolus in prox SMA (70-90%); b. 05/2017 catheter directed tPA, mechanical thrombectomy, and stenting of the SMA.   Essential hypertension with goal blood pressure less than 140/90 01/19/2016   Gastroesophageal reflux disease 01/19/2016   GERD (gastroesophageal reflux disease)    H/O colectomy    History of 2019 novel coronavirus disease (COVID-19) 11/14/2019   History of kidney stones    Hyperlipidemia    Hypertension    Morbid obesity with BMI of 40.0-44.9, adult ([place])    NSTEMI (non-ST elevated myocardial infarction) ([place]) 06/27/2021   a.) high sensitivity troponins trended: 893 --> 1587 --> 1748 --> 868 --> 735 ng/L   Occlusive mesenteric ischemia ([place]) 06/19/2017   Osteoarthritis    PAD (peripheral artery disease) (HCC)    PAF (paroxysmal atrial fibrillation) (HCC)    a.) CHA2DS2-VASc = 6 (sex, CHF, HTN, DVT x2, aortic plaque). b.) rate/rhythm maintained on oral metoprolol tartrate; chronically anticoagulated with standard dose apixaban.   Palpitations    Peripheral edema    Personal history of other malignant neoplasm  of skin 06/01/2016   Pneumonia    Pre-diabetes    Primary osteoarthritis of both knees 06/13/2017   SBO (small bowel obstruction) ([place]) 08/06/2017   Skin cancer of nose    Superior mesenteric artery thrombosis (HCC)    Vitamin D deficiency     Medications:  Medications Prior to Admission  Medication Sig Dispense Refill Last Dose   amLODipine (NORVASC) 5 MG tablet Take 5 mg by mouth daily.   07/21/2022 at 0800   apixaban (ELIQUIS) 5 MG TABS tablet Take 1 tablet (5 mg total) by mouth 2 (two) times daily. 60 tablet 11 07/21/2022 at 0800   aspirin EC 81 MG tablet Take 81 mg by mouth daily. Swallow whole.   07/21/2022 at 0800   atorvastatin (LIPITOR) 80 MG tablet Take 80 mg by mouth at bedtime.   07/21/2022 at 2000    citalopram (CELEXA) 40 MG tablet Take 1 tablet (40 mg total) by mouth daily. 30 tablet 2 07/21/2022 at 0800   COMBIVENT RESPIMAT 20-100 MCG/ACT AERS respimat    prn at prn   esomeprazole (NEXIUM) 20 MG capsule Take 20 mg by mouth daily.   07/21/2022 at 0800   ferrous sulfate 325 (65 FE) MG tablet Take 325 mg by mouth daily.   07/21/2022 at 0800   furosemide (LASIX) 40 MG tablet Take 60 mg by mouth daily.   07/21/2022 at 0800   isosorbide mononitrate (IMDUR) 60 MG 24 hr tablet Take 60 mg by mouth daily.   07/21/2022 at 0800   metoprolol tartrate (LOPRESSOR) 50 MG tablet Take 50 mg by mouth 2 (two) times daily.   07/21/2022 at 2000   Multiple Vitamin (MULTIVITAMIN WITH MINERALS) TABS tablet Take 1 tablet by mouth daily. 30 tablet 0 07/21/2022 at 0800   omeprazole (PRILOSEC) 20 MG capsule Take 20 mg by mouth 2 (two) times daily before a meal.   07/21/2022 at 2000   ondansetron (ZOFRAN) 4 MG tablet Take 4 mg by mouth every 8 (eight) hours as needed for nausea or vomiting.   prn at prn   oxyCODONE-acetaminophen (PERCOCET/ROXICET) 5-325 MG tablet Take 1 tablet by mouth every 6 (six) hours as needed for moderate pain. 40 tablet 0 prn at prn   pregabalin (LYRICA) 100 MG capsule Take 1 capsule (100 mg total) by mouth 3 (three) times daily. 90 capsule 3 07/21/2022 at 2000   feeding supplement (ENSURE ENLIVE / ENSURE PLUS) LIQD Take 237 mLs by mouth 2 (two) times daily between meals. 237 mL 12     Assessment: Pharmacy consulted to dose heparin in this 61 year old female admitted with ischemic leg.   Pt reports taking Eliquis 5 mg PO BID PTA with last dose on 9/29 @ 2000.    Documentation:  Critically elevated aPTT at 162 with correlating anti-xa level of >1.10 s/p thrombectomy on 10/1 at 1600. Vascular believes this may be secondary to heparin 8,000 unit bolus given at 1400 during procedure. Heparin infusion rate was increased from 1400 to 1800 units/hr. Instructed by vascular surgeon to continue heparin infusion  rate as is. Vascular also requests that we refrain from holding infusion.   Goal heparin level increased to 0.7 - 1.0 units/mL due to severe hypercoagulable state. If heparin level supratherapeutic, only decrease rate.  Goal of Therapy:  Heparin level 0.7 - 1.0 units/mL per vascular   Monitor platelets by anticoagulation protocol: Yes  Date/time HL/aPTT Comments 10/4'@1339'$  HL= 0.63 Therapeutic @ 1400 un/hr 10/5'@0359'$  HL = 0.66 Therapeutic @ 1400  un/hr 10/6'@0629'$  HL = 0.55 Therapeutic 10/7 0501 HL = 0.50 Subtherapeutic 10/7 1214 HL = 0.54 Subtherapeutic (NEW GOAL per vascular - see Dr Lorenso Courier note 10/7'@0714'$ ) 10/8 0429 HL = 0.81 Therapeutic x 1 10/8 1212 HL = 0.71 Therapeutic x 2 10/9 0429 HL = 0.52 Subtherapeutic 10/9 1241 HL = 0.64 Subtherapeutic- ('@1850'$  un/hr) 10/9 1955 HL = 0.97  Therapeutic @ 1950 10/10 0731 HL = 1.07 Supra-therapeutic  Plan:  Heparin level is slightly supra-therapeutic. Will decrease heparin infusion rate to 1900 units/hr. Recheck heparin level 6 hrs after changing rate. *close monitoring due to narrow goal range*  Fenix Ruppe Rodriguez-Guzman PharmD, BCPS 08/01/2022 7:42 AM

## 2022-08-01 NOTE — Progress Notes (Signed)
PROGRESS NOTE    NYCOLE KAWAHARA   UKG:[id number] DOB: 12-Jun-1961  DOA: 07/22/2022 Date of Service: 08/01/22 PCP: [facility] [place]     Brief Narrative / Hospital Course:  GLENDY BARSANTI is a 61 y.o. female with medical history significant for peripheral arterial disease s/p right AKA, chronic diastolic dysfunction CHF, anxiety and depression, COPD, paroxysmal A-fib on chronic anticoagulation therapy, who presents to the ED with left leg pain for the past 1 week which had been progressively worsening.  09/30: CTA found with ischemic LLE. The patient was evaluated by vascular surgeon, Dr. Trula Slade in the ED who will take her to the OR on Sunday 10/1.  Patient started on heparin. Admitted to hospitalist service.  10/01: pt went to OR for common femoral artery bypass graft, post OR woke up c/o leg pain, mottled leg, went back to OR for thrombectomy of Left axillary femoral bypass graft with revision. Transferred to ICU.  10/02: Started on cefepime for concern for abscess of right BKA stump 10/03-10/04: Left upper extremity swelling noted overnight, ultrasound pending as of this AM --> no DVT but (+)hematoma. D/w vascular - no procedures planned for this (expected w/ anticoagulation) and ok to transfer to floor. Pain control has been a challenge but she is better w/ anxiolytics.  10/05: Afib w/ occasional rapid rate and hypotension.  Hemoglobin down to 6.2.  Transfused 2 units PRBC, VS stabilized.  10/06: Hemoglobin 9.0 posttransfusion, 7.5 this morning.  Follow H/H midday today Hgb 7.7, hold off on transfusion for now, CBC in AM. PT recommending SNF. 10/07: concern for thrombosis to toes, vascular following w/ pharmacy for heparin management. Transfusing another unit blood for Hgb 7.0  10/08: continues to c/o pain LUE and RLE. Will image RLE --> CT (+) Low-density fluid collection along the distal aspect of the right femoral amputation. This is nonspecific and could be sterile or infected.  No gas or  foreign body is seen associated with this collection. No evidence of osteomyelitis or hip joint effusion. 10/09: Remains on heparin per vascular. Pain control still a challenge    Consultants:  Vascular Surgery   Procedures: 10/01: Left axillary to common femoral artery bypass graft to treat ischemia 10/01: Thrombectomy of left axillary femoral bypass graft with revision  Antimicrobials:  Cefazolin x3 doses  Cefepime 10/2 - 10/9    ASSESSMENT & PLAN:   Principal Problem:   Critical limb ischemia of left lower extremity (HCC) Active Problems:   AKI (acute kidney injury) ([place])   PAF (paroxysmal atrial fibrillation) (HCC)   Chronic diastolic CHF (congestive heart failure) (HCC)   Abscess of knee, right   Hematoma   Critical limb ischemia of left lower extremity ([place]) Peripheral artery disease s/p right BKA S/p OR initially 10/01 for Left axillary to common femoral artery bypass graft, Prevena wound VAC placed .  Post surgery patient had left foot pain and mottled leg was taken to the OR again 10/01, thrombectomy of left axillofemoral bypass graft Continue on heparin per vascular surgery, home eliquis held (Hx Afib) Continuing aspirin, statin Pain control  Left upper extremity swelling Hematoma LUE Noted overnight 10/03-10/04 [place] LUE no DVT but there is large (6.6 x 6.3 x 6.5 cm (volume = 140 cm^3)) hematoma under the left axillary surgical site. D/w vascular - no procedures planned for this (expected w/ anticoagulation) and ok to transfer to floor Monitoring H/H see anemia  Acute blood loss anemia, surgical intervention, hematoma Status posttransfusion 2 units PRBC 07/27/2022,  1 unit PRBC 07/29/22  Serial H/H & CBC  Rt knee fluid collection concerning for abscess At site of amputation, found on CT concern for abscess vs non-infectious fluid collection Cefpime completed, has not helped - favor non-infectious process  Vascular following, avoiding  procedures Continues to c/o pain despite course of abx, 10/08 will get XR preliminary but expect will need CT --> XR ok 10/09: reviewed CT RLE (+) Low-density fluid collection along the distal aspect of the right femoral amputation. This is nonspecific and could be sterile or infected. No gas or  foreign body is seen associated with this collection. No evidence of osteomyelitis or hip joint effusion. 10/10: d/c heparin gtt per vascular, ok to transition to oral AC. Eliquis started. Pain control continues to be a challenge --> scheduled po Norco and reduced frequency IV Dilaudid, increased Lyrice to 150 mg tid - goal to wean off IV pain control in anticipation of discharge soon, now that she's off heparin.    AKI - resolved Prerenal D/c'ed ivf Monitor BMP   Avoid nephrotoxic meds  PAF (paroxysmal atrial fibrillation) (HCC) Eliquis on hold for washout Continue metoprolol for rate control On Heparin for limb ischemia   Chronic diastolic CHF (congestive heart failure) (HCC) Clinically compensated Continue metoprolol and Imdur    DVT prophylaxis: n/a on heparin as above  Pertinent IV fluids/nutrition: diet to advance as tolerate, has been eating [employer] [place] lines / invasive devices: none  Code Status: FULL CODE Family Communication: none at this time   Disposition: remains inpatient  TOC needs: refusing SNF, arrange [facility] Barriers to discharge / significant pending items: [place] arranged, pain control. Anticipate off IV pain meds and discharge home next 1-2 days.                 Subjective:  Still significant LUE pain and RLE pain at stump, feeling weak stil but a bit better compared to yesterday. No CP/SOB.        Objective:  Vitals:   08/01/22 0008 08/01/22 0525 08/01/22 0739 08/01/22 1204  BP: (!) 109/43 (!) 113/53 123/64 (!) 111/59  Pulse: 79 80 77 88  Resp: '20 16 14 16  '$ Temp: 98.5 F (36.9 C) 98.4 F (36.9 C) 99 F (37.2 C) 98.7 F (37.1 C)  TempSrc:  Oral Oral Oral Oral  SpO2: 97% 99% 100% 100%  Weight:      Height:        Intake/Output Summary (Last 24 hours) at 08/01/2022 1227 Last data filed at 08/01/2022 0900 Gross per 24 hour  Intake 904.49 ml  Output 1500 ml  Net -595.51 ml   Filed Weights   07/22/22 0029  Weight: 80.7 kg    Examination:  Constitutional:  VS as above General Appearance: alert, obese, ill appearing but NAD Respiratory: Normal respiratory effort No wheeze No rhonchi No rales Cardiovascular: Irreg irreg, reg rate  No rub/gallop auscultated Gastrointestinal: No tenderness Musculoskeletal:  No clubbing/cyanosis of digits Symmetrical movement in all extremities R BKA LUE (+)tenderness over incision area Intact surgical site L upper chest but swollen, ecchymosis  Neurological: No cranial nerve deficit on limited exam Alert Psychiatric: Normal judgment/insight Depressed/anxious mood and affect - persistent        Scheduled Medications:   sodium chloride   Intravenous Once   aspirin EC  81 mg Oral Q0600   atorvastatin  80 mg Oral QHS   Chlorhexidine Gluconate Cloth  6 each Topical Daily   citalopram  40 mg Oral Daily  docusate sodium  100 mg Oral Daily   HYDROcodone-acetaminophen  2 tablet Oral Q4H   insulin aspart  0-15 Units Subcutaneous Q4H   isosorbide mononitrate  60 mg Oral Daily   pantoprazole  40 mg Oral Daily   pregabalin  150 mg Oral TID    Continuous Infusions:  sodium chloride     magnesium sulfate bolus IVPB      PRN Medications:  sodium chloride, acetaminophen **OR** acetaminophen, acetaminophen **OR** acetaminophen, alum & mag hydroxide-simeth, guaiFENesin-dextromethorphan, hydrALAZINE, HYDROmorphone (DILAUDID) injection, labetalol, LORazepam, magnesium sulfate bolus IVPB, methocarbamol, metoprolol tartrate, ondansetron **OR** ondansetron (ZOFRAN) IV, ondansetron, mouth rinse, phenol, polyethylene glycol, potassium chloride  Antimicrobials:  Anti-infectives  (From admission, onward)    Start     Dose/Rate Route Frequency Ordered Stop   07/27/22 1400  ceFEPIme (MAXIPIME) 2 g in sodium chloride 0.9 % 100 mL IVPB  Status:  Discontinued        2 g 200 mL/hr over 30 Minutes Intravenous Every 8 hours 07/27/22 1233 07/31/22 1405   07/25/22 2200  ceFEPIme (MAXIPIME) 2 g in sodium chloride 0.9 % 100 mL IVPB  Status:  Discontinued        2 g 200 mL/hr over 30 Minutes Intravenous Every 12 hours 07/25/22 0730 07/27/22 1233   07/24/22 1200  ceFEPIme (MAXIPIME) 2 g in sodium chloride 0.9 % 100 mL IVPB  Status:  Discontinued        2 g 200 mL/hr over 30 Minutes Intravenous Every 8 hours 07/24/22 0957 07/25/22 0730   07/23/22 1630  ceFAZolin (ANCEF) IVPB 2g/100 mL premix        2 g 200 mL/hr over 30 Minutes Intravenous Every 8 hours 07/23/22 1227 07/23/22 2357       Data Reviewed: I have personally reviewed following labs and imaging studies  CBC: Recent Labs  Lab 07/28/22 0629 07/28/22 1259 07/29/22 0501 07/29/22 1210 07/30/22 0429 07/30/22 2006 07/31/22 0429 08/01/22 0559  WBC 14.1*  --  12.8*  --  12.3*  --  10.3 9.5  HGB 7.5*   < > 7.0* 6.9* 9.0* 7.7* 7.9* 7.6*  HCT 23.5*   < > 22.4* 22.2* 27.1* 24.2* 25.1* 24.8*  MCV 89.4  --  91.4  --  88.3  --  89.6 91.9  PLT 149*  --  155  --  198  --  218 232   < > = values in this interval not displayed.   Basic Metabolic Panel: Recent Labs  Lab 07/26/22 0411 07/27/22 0359 07/28/22 0629 07/30/22 0429 08/01/22 0559  NA 135 135 138 138 139  K 4.5 4.2 4.1 3.7 4.0  CL 110 108 109 107 103  CO2 20* '22 26 28 30  '$ GLUCOSE 134* 115* 99 106* 117*  BUN 31* '22 18 17 18  '$ CREATININE 1.03* 0.76 0.64 0.59 0.50  CALCIUM 7.7* 7.7* 7.9* 8.0* 8.3*   GFR: Estimated Creatinine Clearance: 71.7 mL/min (by C-G formula based on SCr of 0.5 mg/dL). Liver Function Tests: No results for input(s): "AST", "ALT", "ALKPHOS", "BILITOT", "PROT", "ALBUMIN" in the last 168 hours.  No results for input(s): "LIPASE",  "AMYLASE" in the last 168 hours. No results for input(s): "AMMONIA" in the last 168 hours. Coagulation Profile: No results for input(s): "INR", "PROTIME" in the last 168 hours.  Cardiac Enzymes: No results for input(s): "CKTOTAL", "CKMB", "CKMBINDEX", "TROPONINI" in the last 168 hours. BNP (last 3 results) No results for input(s): "PROBNP" in the last 8760 hours. HbA1C: No results for input(s): "  HGBA1C" in the last 72 hours.  CBG: Recent Labs  Lab 07/31/22 2017 08/01/22 0033 08/01/22 0515 08/01/22 0741 08/01/22 1206  GLUCAP 160* 121* 150* 116* 145*   Lipid Profile: No results for input(s): "CHOL", "HDL", "LDLCALC", "TRIG", "CHOLHDL", "LDLDIRECT" in the last 72 hours. Thyroid Function Tests: No results for input(s): "TSH", "T4TOTAL", "FREET4", "T3FREE", "THYROIDAB" in the last 72 hours. Anemia Panel: No results for input(s): "VITAMINB12", "FOLATE", "FERRITIN", "TIBC", "IRON", "RETICCTPCT" in the last 72 hours. Urine analysis: N/a Sepsis Labs: '@LABRCNTIP'$ (procalcitonin:4,lacticidven:4)  Recent Results (from the past 240 hour(s))  MRSA Next Gen by PCR, Nasal     Status: Abnormal   Collection Time: 07/23/22 12:17 AM   Specimen: Nasal Mucosa; Nasal Swab  Result Value Ref Range Status   MRSA by PCR Next Gen DETECTED (A) NOT DETECTED Final    Comment: RESULT CALLED TO, READ BACK BY AND VERIFIED WITH: Clide Cliff @ 3235 on 07/23/2022 by CAF (NOTE) The GeneXpert MRSA Assay (FDA approved for NASAL specimens only), is one component of a comprehensive MRSA colonization surveillance program. It is not intended to diagnose MRSA infection nor to guide or monitor treatment for MRSA infections. Test performance is not FDA approved in patients less than 103 years old. Performed at [facility], [address] [place], [place] [address]          Radiology Studies: CT ANGIO AO+BIFEM W & OR WO CONTRAST  Result Date: 07/22/2022 CLINICAL DATA:  Arterial embolism lower  extremity EXAM: CT ANGIOGRAPHY OF ABDOMINAL AORTA WITH ILIOFEMORAL RUNOFF TECHNIQUE: Multidetector CT imaging of the abdomen, pelvis and lower extremities was performed using the standard protocol during bolus administration of intravenous contrast. Multiplanar CT image reconstructions and MIPs were obtained to evaluate the vascular anatomy. RADIATION DOSE REDUCTION: This exam was performed according to the departmental dose-optimization program which includes automated exposure control, adjustment of the mA and/or kV according to patient size and/or use of iterative reconstruction technique. CONTRAST:  156m OMNIPAQUE IOHEXOL 350 MG/ML SOLN COMPARISON:  None Available. CT abdomen and pelvis 03/21/2022 FINDINGS: VASCULAR Aorta: Aorto bi iliac stent is occluded. The aorta is otherwise normal in caliber. Celiac: Patent without dissection or aneurysm. SMA: The SMA stent is occluded at the origin. With distal reconstitution. Renals: Patent bilateral renal arteries without aneurysm or dissection. IMA: Not visualized and likely occluded. RIGHT Lower Extremity Inflow: The right common femoral artery stent is occluded with thrombus extending into the right external iliac artery. The right internal iliac artery is likely occluded at its origin with distal reconstitution. Reconstitution of the distal external iliac artery. Outflow: The common femoral, profunda femoral, and superficial femoral arteries are patent and likely supplied from collateral flow. Runoff: Not applicable.  Right above the knee amputation. LEFT Lower Extremity Inflow: The common iliac stent external iliac, and proximal internal iliac are occluded. Distal reconstitution of the internal iliac. Outflow: Reconstitution of the common femoral artery. The common femoral, profunda femoral, superficial femoral, and popliteal arteries are patent. Runoff: Three-vessel runoff is patent to a 12-13 cm above the ankle in the calf. At this point the vessels are  nonopacified possibly due to slow flow though occlusion is not excluded. Veins: No obvious venous abnormality within the limitations of this arterial phase study. Review of the MIP images confirms the above findings. NON-VASCULAR Lower chest: No acute abnormality. Hepatobiliary: Cholecystectomy. No suspicious focal hepatic lesion. No biliary dilation. Pancreas: Unremarkable. Spleen: Unremarkable. Adrenals/Urinary Tract: Adrenal glands are unremarkable. Kidneys are normal, without renal calculi, focal lesion,  or hydronephrosis. Bladder is unremarkable. Stomach/Bowel: Distended stomach. Normal caliber large and small bowel. Colonic diverticulosis without diverticulitis. Herniation of small and large bowel into the large ventral abdominal wall hernia. No evidence of obstruction or strangulation. Bowel anastomosis is present within the hernia sac. Lymphatic: No suspicious adenopathy. Reproductive: Hysterectomy.  No adnexal masses. Other: No free intraperitoneal fluid or gas. Musculoskeletal: Within the distal right thigh at the site of amputation there is a 5.1 x 4.0 cm fluid collection with peripheral enhancement. Edema within the left foot. No acute osseous abnormality. 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 Patent three-vessel runoff of the left lower extremity to the level of the calf where there is non opacification of the distal lower extremity arteries. This may be due to slow flow however occlusion is not excluded. Occluded SMA stent at the origin with distal reconstitution. NON-VASCULAR Right above-the-knee amputation. At the site of amputation there is a fluid collection concerning for abscess. Large ventral abdominal wall hernia containing nonobstructive loops of bowel. Emergent results were called by telephone at the time of interpretation on 07/22/2022 at 1:14 am to provider JADE SUNG , who verbally acknowledged  these results. Electronically Signed   By: Placido Sou M.D.   On: 07/22/2022 01:27            LOS: 10 days       Emeterio Reeve, DO [facility] 08/01/2022, 12:27 PM   Staff may message me via secure chat in [place]  but this may not receive immediate response,  please page for urgent matters!  If 7PM-7AM, please contact night-coverage [email or web address]  Dictation software was used to generate the above note. Typos may occur and escape review, as with typed/written notes. Please contact Dr Sheppard Coil directly for clarity if needed.

## 2022-08-01 NOTE — Progress Notes (Signed)
Physical Therapy Treatment Patient Details Name: Carla [name] MRN: [id number] DOB: Dec 11, 1960 Today's Date: 08/01/2022   History of Present Illness Pt is a 61 year old female s/p OR initially for Left axillary to common femoral artery bypass graft, Prevena wound VAC placed .  Post surgery patient had left foot pain and mottled leg was taken to the OR again , now status post thrombectomy of left axillofemoral bypass graft after presentingto the ED with progressively worsening left leg pain; PMH significant for arterial disease s/p right AKA, chronic diastolic dysfunction CHF, anxiety and depression, COPD, paroxysmal A-fib on chronic anticoagulation therapy    PT Comments    Pt seen with OT at bedside to provide +2 assist with bed mobility and sitting edge of bed. Pt refused attempting to stand, stating she would try next visit. Pt increased sitting time to 18 minutes prior to requesting assistance back to supine with Max x 2. Pt repositioned to comfort with L heel floating and L UE supported on pillow. Pt currently has only tolerated sitting EOB and requires increased physical assistance, therefore would benefit from SNF once medically stable for d/c.   Recommendations for follow up therapy are one component of a multi-disciplinary discharge planning process, led by the attending physician.  Recommendations may be updated based on patient status, additional functional criteria and insurance authorization.  Follow Up Recommendations  Skilled nursing-short term rehab (<3 hours/day) Can patient physically be transported by private vehicle: No   Assistance Recommended at Discharge Frequent or constant Supervision/Assistance  Patient can return home with the following Two people to help with walking and/or transfers;Two people to help with bathing/dressing/bathroom;Assistance with cooking/housework;Assist for transportation;Help with stairs or ramp for entrance   Equipment Recommendations  Other  (comment) (To be determined at next facility)    Recommendations for Other Services       Precautions / Restrictions Precautions Precautions: Fall Restrictions Weight Bearing Restrictions: No     Mobility  Bed Mobility Overal bed mobility: Needs Assistance Bed Mobility: Supine to Sit, Sit to Supine, Rolling Rolling: Max assist   Supine to sit: Mod assist, +2 for physical assistance Sit to supine: Min assist, +2 for physical assistance   General bed mobility comments: Unable to assist with L UE    Transfers                   General transfer comment: pt refused, states will attempt tmrw    Ambulation/Gait                   Stairs             Wheelchair Mobility    Modified Rankin (Stroke Patients Only)       Balance Overall balance assessment: Needs assistance Sitting-balance support: Bilateral upper extremity supported, Feet supported Sitting balance-[name]: Fair Sitting balance - Comments: tolerated sitting edge of bed approx 18 minutes with supervision                                    Cognition Arousal/Alertness: Awake/alert Behavior During Therapy: WFL for tasks assessed/performed, Anxious Overall Cognitive Status: Impaired/Different from baseline Area of Impairment: Safety/judgement                 Orientation Level: Disoriented to, Time Current Attention Level: Focused Memory: Decreased short-term memory Following Commands: Follows one step commands with increased time Safety/Judgement: Decreased awareness of  safety, Decreased awareness of deficits Awareness: Intellectual Problem Solving: Slow processing, Decreased initiation, Difficulty sequencing, Requires verbal cues, Requires tactile cues General Comments: Pt engaged verbally throughout session, answering questions appropraitely        Exercises      General Comments        Pertinent Vitals/Pain Pain Assessment Pain Assessment:  0-10 Pain Score: 10-Worst pain ever Pain Location: L shoulder and R thigh Pain Descriptors / Indicators: Crying, Discomfort, Moaning Pain Intervention(s): Limited activity within patient's tolerance, Patient requesting pain meds-RN notified    Home Living                          Prior Function            PT Goals (current goals can now be found in the care plan section)      Frequency    Min 2X/week      PT Plan Current plan remains appropriate    Co-evaluation   Reason for Co-Treatment: For patient/therapist safety;To address functional/ADL transfers PT goals addressed during session: Mobility/safety with mobility;Balance OT goals addressed during session: Strengthening/ROM      AM-PAC PT "6 Clicks" Mobility   Outcome Measure  Help needed turning from your back to your side while in a flat bed without using bedrails?: A Lot Help needed moving from lying on your back to sitting on the side of a flat bed without using bedrails?: Total Help needed moving to and from a bed to a chair (including a wheelchair)?: Total Help needed standing up from a chair using your arms (e.g., wheelchair or bedside chair)?: Total Help needed to walk in hospital room?: Total Help needed climbing 3-5 steps with a railing? : Total 6 Click Score: 7    End of Session Equipment Utilized During Treatment: Oxygen Activity Tolerance: Patient tolerated treatment well Patient left: in bed;with call bell/phone within reach;with bed alarm set Nurse Communication: Mobility status PT Visit Diagnosis: Other abnormalities of gait and mobility (R26.89);Muscle weakness (generalized) (M62.81);Pain Pain - Right/Left: Left Pain - part of body: Arm     Time: 1610-9604 PT Time Calculation (min) (ACUTE ONLY): 35 min  Charges:  $Therapeutic Activity: 8-22 mins                    Mikel Cella, PTA    Carla [name] 08/01/2022, 2:19 PM

## 2022-08-01 NOTE — Progress Notes (Signed)
Occupational Therapy Treatment Patient Details Name: Carla [name] MRN: [id number] DOB: Apr 03, 1961 Today's Date: 08/01/2022   History of present illness Pt is a 61 year old female s/p OR initially for Left axillary to common femoral artery bypass graft, Prevena wound VAC placed .  Post surgery patient had left foot pain and mottled leg was taken to the OR again , now status post thrombectomy of left axillofemoral bypass graft after presentingto the ED with progressively worsening left leg pain; PMH significant for arterial disease s/p right AKA, chronic diastolic dysfunction CHF, anxiety and depression, COPD, paroxysmal A-fib on chronic anticoagulation therapy   OT comments  Carla [name] was seen for OT/PT co-treatment on this date. Upon arrival to room pt reclined in bed, agreeable to tx. Pt requires MAX A x2 sup>sit, reports 10/10 L shoulder pain with mobility. Tolerated 18 min static sitting, CGA seated grooming, requires BUE support as pt fatigues. Discussed DME for home transfers, pt reports SPT no assist, defers attempt this date, agreeable for next date. Pt making progress toward goals, will continue to follow POC. Discharge recommendation remains appropriate.     Recommendations for follow up therapy are one component of a multi-disciplinary discharge planning process, led by the attending physician.  Recommendations may be updated based on patient status, additional functional criteria and insurance authorization.    Follow Up Recommendations  Skilled nursing-short term rehab (<3 hours/day)    Assistance Recommended at Discharge Frequent or constant Supervision/Assistance  Patient can return home with the following  Two people to help with walking and/or transfers;Two people to help with bathing/dressing/bathroom   Equipment Recommendations  Other (comment) (may require hoyer lift)    Recommendations for Other Services      Precautions / Restrictions Precautions Precautions:  Fall Restrictions Weight Bearing Restrictions: No       Mobility Bed Mobility Overal bed mobility: Needs Assistance Bed Mobility: Supine to Sit, Sit to Supine, Rolling Rolling: Max assist   Supine to sit: Mod assist, +2 for physical assistance Sit to supine: Min assist, +2 for physical assistance        Transfers                   General transfer comment: pt refused, states will attempt tmrw     Balance Overall balance assessment: Needs assistance Sitting-balance support: Bilateral upper extremity supported, Feet supported Sitting balance-[name]: Fair                                     ADL either performed or assessed with clinical judgement   ADL Overall ADL's : Needs assistance/impaired                                       General ADL Comments: MAX A don L sock seated EOB. Tolerated 18 min static sitting, CGA seated grooming, requires BUE support as pt fatigues.      Cognition Arousal/Alertness: Awake/alert Behavior During Therapy: WFL for tasks assessed/performed, Anxious Overall Cognitive Status: Impaired/Different from baseline Area of Impairment: Safety/judgement                         Safety/Judgement: Decreased awareness of safety, Decreased awareness of deficits  Pertinent Vitals/ Pain       Pain Assessment Pain Assessment: 0-10 Pain Score: 10-Worst pain ever Pain Location: L shoulder and R thigh Pain Descriptors / Indicators: Crying, Discomfort, Moaning Pain Intervention(s): Limited activity within patient's tolerance, Repositioned, Patient requesting pain meds-RN notified   Frequency  Min 2X/week        Progress Toward Goals  OT Goals(current goals can now be found in the care plan section)  Progress towards OT goals: Progressing toward goals  Acute Rehab OT Goals Patient Stated Goal: to go home OT Goal Formulation: With patient Time For Goal  Achievement: 08/07/22 Potential to Achieve Goals: Good ADL Goals Pt Will Perform Grooming: with min assist;sitting Pt Will Perform Upper Body Dressing: with min assist;sitting Pt Will Perform Lower Body Dressing: with mod assist;sitting/lateral leans Pt Will Transfer to Toilet: with mod assist;bedside commode Pt Will Perform Toileting - Clothing Manipulation and hygiene: with mod assist;sitting/lateral leans  Plan Discharge plan remains appropriate;Frequency remains appropriate    Co-evaluation    PT/OT/SLP Co-Evaluation/Treatment: Yes Reason for Co-Treatment: For patient/therapist safety;To address functional/ADL transfers PT goals addressed during session: Mobility/safety with mobility OT goals addressed during session: ADL's and self-care      AM-PAC OT "6 Clicks" Daily Activity     Outcome Measure   Help from another person eating meals?: A Little Help from another person taking care of personal grooming?: A Little Help from another person toileting, which includes using toliet, bedpan, or urinal?: A Lot Help from another person bathing (including washing, rinsing, drying)?: A Lot Help from another person to put on and taking off regular upper body clothing?: A Lot Help from another person to put on and taking off regular lower body clothing?: A Lot 6 Click Score: 14    End of Session    OT Visit Diagnosis: Muscle weakness (generalized) (M62.81);Unsteadiness on feet (R26.81)   Activity Tolerance Patient tolerated treatment well   Patient Left in bed;with bed alarm set;with call bell/phone within reach   Nurse Communication Mobility status        Time: 2761-8485 OT Time Calculation (min): 28 min  Charges: OT General Charges $OT Visit: 1 Visit OT Treatments $Self Care/Home Management : 8-22 mins  Dessie Coma, M.S. OTR/L  08/01/22, 1:23 PM  ascom [phone]

## 2022-08-02 DIAGNOSIS — I70222 Atherosclerosis of native arteries of extremities with rest pain, left leg: Secondary | ICD-10-CM | POA: Diagnosis not present

## 2022-08-02 LAB — CBC
HCT: 24 % — ABNORMAL LOW (ref 36.0–46.0)
Hemoglobin: 7.4 g/dL — ABNORMAL LOW (ref 12.0–15.0)
MCH: 28.7 pg (ref 26.0–34.0)
MCHC: 30.8 g/dL (ref 30.0–36.0)
MCV: 93 fL (ref 80.0–100.0)
Platelets: 235 10*3/uL (ref 150–400)
RBC: 2.58 MIL/uL — ABNORMAL LOW (ref 3.87–5.11)
RDW: 17.5 % — ABNORMAL HIGH (ref 11.5–15.5)
WBC: 7.3 10*3/uL (ref 4.0–10.5)
nRBC: 0.3 % — ABNORMAL HIGH (ref 0.0–0.2)

## 2022-08-02 LAB — GLUCOSE, CAPILLARY
Glucose-Capillary: 107 mg/dL — ABNORMAL HIGH (ref 70–99)
Glucose-Capillary: 110 mg/dL — ABNORMAL HIGH (ref 70–99)
Glucose-Capillary: 119 mg/dL — ABNORMAL HIGH (ref 70–99)
Glucose-Capillary: 122 mg/dL — ABNORMAL HIGH (ref 70–99)
Glucose-Capillary: 144 mg/dL — ABNORMAL HIGH (ref 70–99)
Glucose-Capillary: 155 mg/dL — ABNORMAL HIGH (ref 70–99)
Glucose-Capillary: 181 mg/dL — ABNORMAL HIGH (ref 70–99)

## 2022-08-02 LAB — BASIC METABOLIC PANEL
Anion gap: 7 (ref 5–15)
BUN: 16 mg/dL (ref 8–23)
CO2: 29 mmol/L (ref 22–32)
Calcium: 8.4 mg/dL — ABNORMAL LOW (ref 8.9–10.3)
Chloride: 105 mmol/L (ref 98–111)
Creatinine, Ser: 0.49 mg/dL (ref 0.44–1.00)
GFR, Estimated: >60 mL/min (ref >60)
Glucose, Bld: 124 mg/dL — ABNORMAL HIGH (ref 70–99)
Potassium: 3.9 mmol/L (ref 3.5–5.1)
Sodium: 141 mmol/L (ref 135–145)

## 2022-08-02 MED ORDER — HYDROMORPHONE HCL 1 MG/ML IJ SOLN
1.0000 mg | Freq: Once | INTRAMUSCULAR | Status: AC
  Administered 2022-08-02: 1 mg via INTRAVENOUS
  Filled 2022-08-02: qty 1

## 2022-08-02 MED ORDER — HYDROCODONE-ACETAMINOPHEN 5-325 MG PO TABS
1.0000 | ORAL_TABLET | ORAL | Status: DC | PRN
  Administered 2022-08-02 – 2022-08-11 (×40): 2 via ORAL
  Administered 2022-08-12 (×2): 1 via ORAL
  Filled 2022-08-02 (×23): qty 2
  Filled 2022-08-02: qty 1
  Filled 2022-08-02: qty 2
  Filled 2022-08-02: qty 1
  Filled 2022-08-02 (×17): qty 2
  Filled 2022-08-02: qty 1
  Filled 2022-08-02 (×2): qty 2

## 2022-08-02 MED ORDER — ACETAMINOPHEN 325 MG PO TABS: 325.0000 mg | ORAL_TABLET | ORAL | Status: DC | PRN

## 2022-08-02 MED ORDER — LIDOCAINE 5 % EX PTCH
1.0000 | MEDICATED_PATCH | CUTANEOUS | Status: DC
  Administered 2022-08-02 – 2022-08-11 (×9): 1 via TRANSDERMAL
  Filled 2022-08-02 (×11): qty 1

## 2022-08-02 MED ORDER — ACETAMINOPHEN 325 MG RE SUPP: 325.0000 mg | RECTAL | Status: DC | PRN

## 2022-08-02 NOTE — Progress Notes (Signed)
[facility]  Daily Progress Note   Subjective  -   Off of heparin and now on oral anticoagulation and pain medication No major events overnight  Objective Vitals:   08/01/22 1600 08/01/22 2117 08/02/22 0017 08/02/22 0355  BP: 98/72 (!) 104/56 (!) 94/55 105/62  Pulse: 79 79 78 81  Resp: '17 19 19 14  '$ Temp:  98.9 F (37.2 C) 98.8 F (37.1 C) 98.5 F (36.9 C)  TempSrc:  Oral Oral Oral  SpO2:  98% 98% 97%  Weight:      Height:        Intake/Output Summary (Last 24 hours) at 08/02/2022 1005 Last data filed at 08/02/2022 0437 Gross per 24 hour  Intake 941.7 ml  Output 5550 ml  Net -4608.3 ml    PULM  CTAB CV  RRR VASC  left foot is warm with good capillary refill.  Palpable left radial pulse.  Bruising at left axillary incision site is stable  Laboratory CBC    Component Value Date/Time   WBC 7.3 08/02/2022 0424   HGB 7.4 (L) 08/02/2022 0424   HGB 13.4 02/12/2015 1137   HCT 24.0 (L) 08/02/2022 0424   HCT 40.9 02/12/2015 1137   PLT 235 08/02/2022 0424   PLT 272 02/12/2015 1137    BMET    Component Value Date/Time   NA 141 08/02/2022 0424   NA 140 02/12/2015 1137   K 3.9 08/02/2022 0424   K 2.9 (L) 02/12/2015 1137   CL 105 08/02/2022 0424   CL 107 02/12/2015 1137   CO2 29 08/02/2022 0424   CO2 27 02/12/2015 1137   GLUCOSE 124 (H) 08/02/2022 0424   GLUCOSE 120 (H) 02/12/2015 1137   BUN 16 08/02/2022 0424   BUN 15 02/12/2015 1137   CREATININE 0.49 08/02/2022 0424   CREATININE 0.78 02/12/2015 1137   CALCIUM 8.4 (L) 08/02/2022 0424   CALCIUM 9.0 02/12/2015 1137   GFRNONAA >60 08/02/2022 0424   GFRNONAA >60 02/12/2015 1137   GFRAA >60 11/23/2019 1330   GFRAA >60 02/12/2015 1137    Assessment/Planning: POD #10 s/p left axillofemoral bypass for ischemic leg  Left foot perfusion is stable. Left arm swelling is better and perfusion is stable. Needs to improve her nutrition and activity levels Switched over to oral anticoagulation  which is reasonable No further vascular Recs at this point   [name]  08/02/2022, 10:05 AM

## 2022-08-02 NOTE — Plan of Care (Signed)
  Problem: Education: Goal: Knowledge of General Education information will improve Description: Including pain rating scale, medication(s)/side effects and non-pharmacologic comfort measures Outcome: Progressing   Problem: Health Behavior/Discharge Planning: Goal: Ability to manage health-related needs will improve Outcome: Progressing   Problem: Clinical Measurements: Goal: Ability to maintain clinical measurements within normal limits will improve Outcome: Progressing Goal: Will remain free from infection Outcome: Progressing Goal: Diagnostic test results will improve Outcome: Progressing Goal: Respiratory complications will improve Outcome: Progressing Goal: Cardiovascular complication will be avoided Outcome: Progressing   Problem: Activity: Goal: Risk for activity intolerance will decrease Outcome: Progressing   Problem: Nutrition: Goal: Adequate nutrition will be maintained Outcome: Progressing   Problem: Coping: Goal: Level of anxiety will decrease Outcome: Progressing   Problem: Elimination: Goal: Will not experience complications related to bowel motility Outcome: Progressing Goal: Will not experience complications related to urinary retention Outcome: Progressing   Problem: Pain Managment: Goal: General experience of comfort will improve Outcome: Progressing   Problem: Safety: Goal: Ability to remain free from injury will improve Outcome: Progressing   Problem: Skin Integrity: Goal: Risk for impaired skin integrity will decrease Outcome: Progressing   Problem: Education: Goal: Knowledge of prescribed regimen will improve Outcome: Progressing   Problem: Activity: Goal: Ability to tolerate increased activity will improve Outcome: Progressing   Problem: Bowel/Gastric: Goal: Gastrointestinal status for postoperative course will improve Outcome: Progressing   Problem: Clinical Measurements: Goal: Postoperative complications will be avoided or  minimized Outcome: Progressing Goal: Signs and symptoms of graft occlusion will improve Outcome: Progressing   Problem: Skin Integrity: Goal: Demonstration of wound healing without infection will improve Outcome: Progressing   Problem: Education: Goal: Ability to describe self-care measures that may prevent or decrease complications (Diabetes Survival Skills Education) will improve Outcome: Progressing Goal: Individualized Educational Video(s) Outcome: Progressing   Problem: Coping: Goal: Ability to adjust to condition or change in health will improve Outcome: Progressing   Problem: Fluid Volume: Goal: Ability to maintain a balanced intake and output will improve Outcome: Progressing   Problem: Health Behavior/Discharge Planning: Goal: Ability to identify and utilize available resources and services will improve Outcome: Progressing Goal: Ability to manage health-related needs will improve Outcome: Progressing   Problem: Metabolic: Goal: Ability to maintain appropriate glucose levels will improve Outcome: Progressing   Problem: Nutritional: Goal: Maintenance of adequate nutrition will improve Outcome: Progressing Goal: Progress toward achieving an optimal weight will improve Outcome: Progressing   Problem: Skin Integrity: Goal: Risk for impaired skin integrity will decrease Outcome: Progressing   Problem: Tissue Perfusion: Goal: Adequacy of tissue perfusion will improve Outcome: Progressing

## 2022-08-02 NOTE — Progress Notes (Signed)
Physical Therapy Treatment Patient Details Name: Carla [name] MRN: [id number] DOB: [birth year] Today's Date: 08/02/2022   History of Present Illness Pt is a 61 year old female s/p OR initially for Left axillary to common femoral artery bypass graft, Prevena wound VAC placed .  Post surgery patient had left foot pain and mottled leg was taken to the OR again , now status post thrombectomy of left axillofemoral bypass graft after presentingto the ED with progressively worsening left leg pain; PMH significant for arterial disease s/p right AKA, chronic diastolic dysfunction CHF, anxiety and depression, COPD, paroxysmal A-fib on chronic anticoagulation therapy    PT Comments    Pt demonstrated increased tolerance for PT/OT session this date, progressing to transferring out of bed to bedside recliner. Unsuccessful attempt to stand from raised bed with RW possibly due to L UE pain/wt bearing tolerance. Pt was able to pivot to recliner with ModAx 2 for safety. Pt positioned to comfort in recliner with granddaughter present. Overall, pt tolerated mobility, L LE ROM with less pain, and was able to attain upright stance for stand pivot transfer. Will continue to progress towards set goals. Pt will benefit from SNF upon d/c due to limited assist at home.    Recommendations for follow up therapy are one component of a multi-disciplinary discharge planning process, led by the attending physician.  Recommendations may be updated based on patient status, additional functional criteria and insurance authorization.  Follow Up Recommendations  Skilled nursing-short term rehab (<3 hours/day) Can patient physically be transported by private vehicle: No   Assistance Recommended at Discharge Frequent or constant Supervision/Assistance  Patient can return home with the following Two people to help with walking and/or transfers;Two people to help with bathing/dressing/bathroom;Assistance with cooking/housework;Assist for  transportation;Help with stairs or ramp for entrance   Equipment Recommendations  Other (comment)    Recommendations for Other Services       Precautions / Restrictions Precautions Precautions: Fall Restrictions Weight Bearing Restrictions: No     Mobility  Bed Mobility Overal bed mobility: Needs Assistance Bed Mobility: Supine to Sit     Supine to sit: Min assist, +2 for physical assistance          Transfers Overall transfer level: Needs assistance   Transfers: Sit to/from Stand, Bed to chair/wheelchair/BSC Sit to Stand: Mod assist, +2 physical assistance, From elevated surface Stand pivot transfers: Mod assist, +2 physical assistance         General transfer comment: Attempted sit<>stand from bed with RW, pt unable to support self enought to attain standing    Ambulation/Gait                   Stairs             Wheelchair Mobility    Modified Rankin (Stroke Patients Only)       Balance Overall balance assessment: Needs assistance Sitting-balance support: Bilateral upper extremity supported, Feet supported Sitting balance-[name]: Fair Sitting balance - Comments: Sat EOB for a few minutes prior to transferring to [facility]   Standing balance support: Bilateral upper extremity supported Standing balance-[name]: Poor                              Cognition Arousal/Alertness: Awake/alert Behavior During Therapy: WFL for tasks assessed/performed, Anxious Overall Cognitive Status: Impaired/Different from baseline Area of Impairment: Safety/judgement  Orientation Level: Disoriented to, Time Current Attention Level: Focused Memory: Decreased short-term memory Following Commands: Follows one step commands with increased time Safety/Judgement: Decreased awareness of safety, Decreased awareness of deficits Awareness: Intellectual Problem Solving: Slow processing, Decreased initiation, Difficulty  sequencing, Requires verbal cues, Requires tactile cues General Comments: Pt engaged verbally throughout session, answering questions appropraitely        Exercises      General Comments        Pertinent Vitals/Pain Pain Assessment Pain Assessment: 0-10 Pain Score: 6  Pain Location: L flank and shoulder Pain Descriptors / Indicators: Crying, Discomfort, Moaning Pain Intervention(s): Monitored during session, Premedicated before session    Home Living                          Prior Function            PT Goals (current goals can now be found in the care plan section) Acute Rehab PT Goals Patient Stated Goal: to improve pain and mobility Progress towards PT goals: Progressing toward goals    Frequency    Min 2X/week      PT Plan Current plan remains appropriate    Co-evaluation PT/OT/SLP Co-Evaluation/Treatment: Yes Reason for Co-Treatment: For patient/therapist safety;To address functional/ADL transfers PT goals addressed during session: Mobility/safety with mobility OT goals addressed during session: ADL's and self-care      AM-PAC PT "6 Clicks" Mobility   Outcome Measure  Help needed turning from your back to your side while in a flat bed without using bedrails?: A Lot Help needed moving from lying on your back to sitting on the side of a flat bed without using bedrails?: Total Help needed moving to and from a bed to a chair (including a wheelchair)?: Total Help needed standing up from a chair using your arms (e.g., wheelchair or bedside chair)?: Total Help needed to walk in hospital room?: Total Help needed climbing 3-5 steps with a railing? : Total 6 Click Score: 7    End of Session Equipment Utilized During Treatment: Oxygen Activity Tolerance: Patient tolerated treatment well Patient left: in bed;with call bell/phone within reach;with bed alarm set Nurse Communication: Mobility status PT Visit Diagnosis: Other abnormalities of gait and  mobility (R26.89);Muscle weakness (generalized) (M62.81);Pain Pain - Right/Left: Left Pain - part of body: Arm     Time: 1132-1202 PT Time Calculation (min) (ACUTE ONLY): 30 min  Charges:  $Therapeutic Activity: 8-22 mins                    Mikel Cella, PTA   [name] 08/02/2022, 3:16 PM

## 2022-08-02 NOTE — Progress Notes (Signed)
Occupational Therapy Treatment Patient Details Name: Carla [name] MRN: [id number] DOB: 1961-08-01 Today's Date: 08/02/2022   History of present illness Pt is a 61 year old female s/p OR initially for Left axillary to common femoral artery bypass graft, Prevena wound VAC placed .  Post surgery patient had left foot pain and mottled leg was taken to the OR again , now status post thrombectomy of left axillofemoral bypass graft after presentingto the ED with progressively worsening left leg pain; PMH significant for arterial disease s/p right AKA, chronic diastolic dysfunction CHF, anxiety and depression, COPD, paroxysmal A-fib on chronic anticoagulation therapy   OT comments  Carla [name] was seen for OT treatment on this date. Upon arrival to room pt reclined in bed with PTA and family in room, pt agreeable to tx. Pt requires MIN A x2 exit bed, fair sitting balance. MOD A x2 sit<>stand (unable to clear rear with RW use). MOD A x2 bed>chair stand pivot t/f. Pt and family educated on d/c recs and DME recs if deciding to return home. Pt continues to refuse STR. Pt making progress toward goals, will continue to follow POC. Discharge recommendation remains appropriate.     Recommendations for follow up therapy are one component of a multi-disciplinary discharge planning process, led by the attending physician.  Recommendations may be updated based on patient status, additional functional criteria and insurance authorization.    Follow Up Recommendations  Skilled nursing-short term rehab (<3 hours/day)    Assistance Recommended at Discharge Frequent or constant Supervision/Assistance  Patient can return home with the following  Two people to help with walking and/or transfers;Two people to help with bathing/dressing/bathroom   Equipment Recommendations  Other (comment) (drop arm BSC)    Recommendations for Other Services      Precautions / Restrictions Precautions Precautions:  Fall Restrictions Weight Bearing Restrictions: No       Mobility Bed Mobility Overal bed mobility: Needs Assistance Bed Mobility: Supine to Sit     Supine to sit: Min assist, +2 for physical assistance          Transfers Overall transfer level: Needs assistance   Transfers: Sit to/from Stand, Bed to chair/wheelchair/BSC Sit to Stand: Mod assist, +2 physical assistance, From elevated surface Stand pivot transfers: Mod assist, +2 physical assistance               Balance Overall balance assessment: Needs assistance Sitting-balance support: Bilateral upper extremity supported, Feet supported Sitting balance-[name]: Fair     Standing balance support: Bilateral upper extremity supported Standing balance-[name]: Poor                             ADL either performed or assessed with clinical judgement   ADL Overall ADL's : Needs assistance/impaired                                       General ADL Comments: MOD A x2 for simulated BSC t/f      Cognition Arousal/Alertness: Awake/alert Behavior During Therapy: WFL for tasks assessed/performed, Anxious Overall Cognitive Status: Impaired/Different from baseline Area of Impairment: Safety/judgement                         Safety/Judgement: Decreased awareness of safety, Decreased awareness of deficits  Pertinent Vitals/ Pain       Pain Assessment Pain Assessment: 0-10 Pain Score: 10-Worst pain ever Pain Location: L flank and shoulder Pain Descriptors / Indicators: Crying, Discomfort, Moaning Pain Intervention(s): Limited activity within patient's tolerance, Premedicated before session   Frequency  Min 2X/week        Progress Toward Goals  OT Goals(current goals can now be found in the care plan section)  Progress towards OT goals: Progressing toward goals  Acute Rehab OT Goals Patient Stated Goal: improve pain OT Goal  Formulation: With patient Time For Goal Achievement: 08/07/22 Potential to Achieve Goals: Good ADL Goals Pt Will Perform Grooming: with min assist;sitting Pt Will Perform Upper Body Dressing: with min assist;sitting Pt Will Perform Lower Body Dressing: with mod assist;sitting/lateral leans Pt Will Transfer to Toilet: with mod assist;bedside commode Pt Will Perform Toileting - Clothing Manipulation and hygiene: with mod assist;sitting/lateral leans  Plan Discharge plan remains appropriate;Frequency remains appropriate    Co-evaluation    PT/OT/SLP Co-Evaluation/Treatment: Yes Reason for Co-Treatment: For patient/therapist safety;To address functional/ADL transfers PT goals addressed during session: Mobility/safety with mobility OT goals addressed during session: ADL's and self-care      AM-PAC OT "6 Clicks" Daily Activity     Outcome Measure   Help from another person eating meals?: A Little Help from another person taking care of personal grooming?: A Little Help from another person toileting, which includes using toliet, bedpan, or urinal?: A Lot Help from another person bathing (including washing, rinsing, drying)?: A Lot Help from another person to put on and taking off regular upper body clothing?: A Lot Help from another person to put on and taking off regular lower body clothing?: A Lot 6 Click Score: 14    End of Session    OT Visit Diagnosis: Muscle weakness (generalized) (M62.81);Unsteadiness on feet (R26.81)   Activity Tolerance Patient tolerated treatment well   Patient Left in chair;with call bell/phone within reach;with family/visitor present   Nurse Communication Mobility status        Time: 0768-0881 OT Time Calculation (min): 28 min  Charges: OT General Charges $OT Visit: 1 Visit OT Treatments $Self Care/Home Management : 8-22 mins  Dessie Coma, M.S. OTR/L  08/02/22, 1:14 PM  ascom [phone]

## 2022-08-02 NOTE — Progress Notes (Signed)
PROGRESS NOTE    Carla [name]  RXV:[id number] DOB: 1961/07/05 DOA: 07/22/2022 PCP: [facility]    Brief Narrative:  Carla [name] is a 61 y.o. female with medical history significant for peripheral arterial disease s/p right AKA, chronic diastolic dysfunction CHF, anxiety and depression, COPD, paroxysmal A-fib on chronic anticoagulation therapy, who presents to the ED with left leg pain for the past 1 week which had been progressively worsening.  09/30: CTA found with ischemic LLE. The patient was evaluated by vascular surgeon, Dr. Trula Slade in the ED who will take her to the OR on Sunday 10/1.  Patient started on heparin. Admitted to hospitalist service.  10/01: pt went to OR for common femoral artery bypass graft, post OR woke up c/o leg pain, mottled leg, went back to OR for thrombectomy of Left axillary femoral bypass graft with revision. Transferred to ICU.  10/02: Started on cefepime for concern for abscess of right BKA stump 10/03-10/04: Left upper extremity swelling noted overnight, ultrasound pending as of this AM --> no DVT but (+)hematoma. D/w vascular - no procedures planned for this (expected w/ anticoagulation) and ok to transfer to floor. Pain control has been a challenge but she is better w/ anxiolytics.  10/05: Afib w/ occasional rapid rate and hypotension.  Hemoglobin down to 6.2.  Transfused 2 units PRBC, VS stabilized.  10/06: Hemoglobin 9.0 posttransfusion, 7.5 this morning.  Follow H/H midday today Hgb 7.7, hold off on transfusion for now, CBC in AM. PT recommending SNF. 10/07: concern for thrombosis to toes, vascular following w/ pharmacy for heparin management. Transfusing another unit blood for Hgb 7.0  10/08: continues to c/o pain LUE and RLE. Will image RLE --> CT (+) Low-density fluid collection along the distal aspect of the right femoral amputation. This is nonspecific and could be sterile or infected. No gas or  foreign body is seen associated with this  collection. No evidence of osteomyelitis or hip joint effusion. 10/09: Remains on heparin per vascular. Pain control still a challenge 10/11 IV Dilaudid discontinued.  Work with PT and sitting in chair today.    Consultants:  Vascular Surgery    Procedures: 10/01: Left axillary to common femoral artery bypass graft to treat ischemia 10/01: Thrombectomy of left axillary femoral bypass graft with revision   Antimicrobials:  Cefazolin x3 doses  Cefepime 10/2 - 10/9       Subjective: Complaining of neuropathic pain.  And left back pain where she has bruising.  Objective: Vitals:   08/02/22 0017 08/02/22 0355 08/02/22 1132 08/02/22 1625  BP: (!) 94/55 105/62 113/61 (!) 104/58  Pulse: 78 81 71 77  Resp: '19 14 16 14  '$ Temp: 98.8 F (37.1 C) 98.5 F (36.9 C) 97.9 F (36.6 C) 98 F (36.7 C)  TempSrc: Oral Oral  Oral  SpO2: 98% 97% 99% 97%  Weight:      Height:        Intake/Output Summary (Last 24 hours) at 08/02/2022 1654 Last data filed at 08/02/2022 1500 Gross per 24 hour  Intake 1020 ml  Output 6150 ml  Net -5130 ml   Filed Weights   07/22/22 0029  Weight: 80.7 kg    Examination: Calm, NAD Cta no w/r Reg s1/s2 no gallop Soft benign +bs Rt aka, mild LLE edema Aaoxox3  Mood and affect appropriate in current setting     Data Reviewed: I have personally reviewed following labs and imaging studies  CBC: Recent Labs  Lab 07/29/22 0501  07/29/22 1210 07/30/22 0429 07/30/22 2006 07/31/22 0429 08/01/22 0559 08/02/22 0424  WBC 12.8*  --  12.3*  --  10.3 9.5 7.3  HGB 7.0*   < > 9.0* 7.7* 7.9* 7.6* 7.4*  HCT 22.4*   < > 27.1* 24.2* 25.1* 24.8* 24.0*  MCV 91.4  --  88.3  --  89.6 91.9 93.0  PLT 155  --  198  --  218 232 235   < > = values in this interval not displayed.   Basic Metabolic Panel: Recent Labs  Lab 07/27/22 0359 07/28/22 0629 07/30/22 0429 08/01/22 0559 08/02/22 0424  NA 135 138 138 139 141  K 4.2 4.1 3.7 4.0 3.9  CL 108 109 107 103  105  CO2 '22 26 28 30 29  '$ GLUCOSE 115* 99 106* 117* 124*  BUN '22 18 17 18 16  '$ CREATININE 0.76 0.64 0.59 0.50 0.49  CALCIUM 7.7* 7.9* 8.0* 8.3* 8.4*   GFR: Estimated Creatinine Clearance: 71.7 mL/min (by C-G formula based on SCr of 0.49 mg/dL). Liver Function Tests: No results for input(s): "AST", "ALT", "ALKPHOS", "BILITOT", "PROT", "ALBUMIN" in the last 168 hours. No results for input(s): "LIPASE", "AMYLASE" in the last 168 hours. No results for input(s): "AMMONIA" in the last 168 hours. Coagulation Profile: No results for input(s): "INR", "PROTIME" in the last 168 hours. Cardiac Enzymes: No results for input(s): "CKTOTAL", "CKMB", "CKMBINDEX", "TROPONINI" in the last 168 hours. BNP (last 3 results) No results for input(s): "PROBNP" in the last 8760 hours. HbA1C: No results for input(s): "HGBA1C" in the last 72 hours. CBG: Recent Labs  Lab 08/02/22 0015 08/02/22 0358 08/02/22 0818 08/02/22 1131 08/02/22 1626  GLUCAP 119* 122* 107* 144* 110*   Lipid Profile: No results for input(s): "CHOL", "HDL", "LDLCALC", "TRIG", "CHOLHDL", "LDLDIRECT" in the last 72 hours. Thyroid Function Tests: No results for input(s): "TSH", "T4TOTAL", "FREET4", "T3FREE", "THYROIDAB" in the last 72 hours. Anemia Panel: No results for input(s): "VITAMINB12", "FOLATE", "FERRITIN", "TIBC", "IRON", "RETICCTPCT" in the last 72 hours. Sepsis Labs: No results for input(s): "PROCALCITON", "LATICACIDVEN" in the last 168 hours.  No results found for this or any previous visit (from the past 240 hour(s)).       Radiology Studies: No results found.      Scheduled Meds:  sodium chloride   Intravenous Once   apixaban  10 mg Oral BID   Followed by   Derrill Memo ON 08/08/2022] apixaban  5 mg Oral BID   aspirin EC  81 mg Oral Q0600   atorvastatin  80 mg Oral QHS   Chlorhexidine Gluconate Cloth  6 each Topical Daily   citalopram  40 mg Oral Daily   docusate sodium  100 mg Oral Daily   insulin aspart   0-15 Units Subcutaneous Q4H   isosorbide mononitrate  60 mg Oral Daily   pantoprazole  40 mg Oral Daily   pregabalin  150 mg Oral TID   Continuous Infusions:  sodium chloride     magnesium sulfate bolus IVPB      Assessment & Plan:   Principal Problem:   Critical limb ischemia of left lower extremity (HCC) Active Problems:   AKI (acute kidney injury) ([place])   PAF (paroxysmal atrial fibrillation) (HCC)   Chronic diastolic CHF (congestive heart failure) (HCC)   Abscess of knee, right   Hematoma   Critical limb ischemia of left lower extremity ([place]) Peripheral artery disease s/p right BKA S/p OR initially 10/01 for Left axillary to common femoral artery bypass  graft, Prevena wound VAC placed .  Post surgery patient had left foot pain and mottled leg was taken to the OR again 10/01, thrombectomy of left axillofemoral bypass graft Continue on heparin per vascular surgery, home eliquis held (Hx Afib) Continuing aspirin, statin 10/11 pain controlled.  DC IV Dilaudid.  Start Norco.  Lidocaine patch   Left upper extremity swelling Hematoma LUE Noted overnight 10/03-10/04 [place] LUE no DVT but there is large (6.6 x 6.3 x 6.5 cm (volume = 140 cm^3)) hematoma under the left axillary surgical site. D/w vascular - no procedures planned for this (expected w/ anticoagulation) and ok to transfer to floor 10/11 give blood work break to avoid more drop. Monitor periodically   Acute blood loss anemia, surgical intervention, hematoma Status posttransfusion 2 units PRBC 07/27/2022, 1 unit PRBC 07/29/22  10/11 as above   Rt knee fluid collection concerning for abscess At site of amputation, found on CT concern for abscess vs non-infectious fluid collection Cefpime completed, has not helped - favor non-infectious process  Vascular following, avoiding procedures Continues to c/o pain despite course of abx, 10/08 will get XR preliminary but expect will need CT --> XR ok 10/09: reviewed CT RLE (+)  Low-density fluid collection along the distal aspect of the right femoral amputation. This is nonspecific and could be sterile or infected. No gas or  foreign body is seen associated with this collection. No evidence of osteomyelitis or hip joint effusion. 10/10: d/c heparin gtt per vascular, ok to transition to oral AC. Eliquis started. Pain control continues to be a challenge --> scheduled po Norco and reduced frequency IV Dilaudid, increased Lyrice to 150 mg tid - goal to wean off IV pain control in anticipation of discharge soon, now that she's off heparin.  10/11 dc narco.   AKI - resolved Prerenal D/c'ed ivf Monitor BMP   Avoid nephrotoxic meds   PAF (paroxysmal atrial fibrillation) (HCC) Eliquis on hold for washout Continue metoprolol for rate control On Heparin for limb ischemia    Chronic diastolic CHF (congestive heart failure) (HCC) Clinically compensated Continue metoprolol and Imdur       DVT prophylaxis: eliquis Code Status:full Family Communication: GD at bedside Disposition Plan:  Status is: Inpatient Remains inpatient appropriate because: IV treatment. Needs SNF     LOS: 11 days   Time spent: 35 min    Nolberto Hanlon, MD [facility] Pager [phone]  If 7PM-7AM, please contact night-coverage 08/02/2022, 4:54 PM

## 2022-08-03 DIAGNOSIS — I70222 Atherosclerosis of native arteries of extremities with rest pain, left leg: Secondary | ICD-10-CM | POA: Diagnosis not present

## 2022-08-03 LAB — GLUCOSE, CAPILLARY
Glucose-Capillary: 122 mg/dL — ABNORMAL HIGH (ref 70–99)
Glucose-Capillary: 112 mg/dL — ABNORMAL HIGH (ref 70–99)
Glucose-Capillary: 109 mg/dL — ABNORMAL HIGH (ref 70–99)
Glucose-Capillary: 162 mg/dL — ABNORMAL HIGH (ref 70–99)
Glucose-Capillary: 154 mg/dL — ABNORMAL HIGH (ref 70–99)

## 2022-08-03 NOTE — Progress Notes (Signed)
[facility]  Daily Progress Note   Subjective  -   Patient still complains of weakness and pain in the left arm.  Really complains of pain in her right stump and her left foot tube but her alarms bothering her the most.  Working on a limited basis with therapy.  Not using her arm much  Objective Vitals:   08/02/22 1947 08/02/22 2352 08/03/22 0403 08/03/22 0800  BP: 120/65 (!) 92/53 (!) 98/53 124/62  Pulse: 89 75 75 70  Resp: '20 20 18 16  '$ Temp: 98.4 F (36.9 C) 98 F (36.7 C) 98.2 F (36.8 C) 98 F (36.7 C)  TempSrc:    Oral  SpO2: 99% 99% 99% 99%  Weight:      Height:        Intake/Output Summary (Last 24 hours) at 08/03/2022 1016 Last data filed at 08/03/2022 0402 Gross per 24 hour  Intake 60 ml  Output 3250 ml  Net -3190 ml    PULM  CTAB CV  RRR VASC  left radial pulses palpable, 1-2+ left upper extremity edema, hematoma in the left axillary incision stable to improved, left foot is warm with good capillary refill  Laboratory CBC    Component Value Date/Time   WBC 7.3 08/02/2022 0424   HGB 7.4 (L) 08/02/2022 0424   HGB 13.4 02/12/2015 1137   HCT 24.0 (L) 08/02/2022 0424   HCT 40.9 02/12/2015 1137   PLT 235 08/02/2022 0424   PLT 272 02/12/2015 1137    BMET    Component Value Date/Time   NA 141 08/02/2022 0424   NA 140 02/12/2015 1137   K 3.9 08/02/2022 0424   K 2.9 (L) 02/12/2015 1137   CL 105 08/02/2022 0424   CL 107 02/12/2015 1137   CO2 29 08/02/2022 0424   CO2 27 02/12/2015 1137   GLUCOSE 124 (H) 08/02/2022 0424   GLUCOSE 120 (H) 02/12/2015 1137   BUN 16 08/02/2022 0424   BUN 15 02/12/2015 1137   CREATININE 0.49 08/02/2022 0424   CREATININE 0.78 02/12/2015 1137   CALCIUM 8.4 (L) 08/02/2022 0424   CALCIUM 9.0 02/12/2015 1137   GFRNONAA >60 08/02/2022 0424   GFRNONAA >60 02/12/2015 1137   GFRAA >60 11/23/2019 1330   GFRAA >60 02/12/2015 1137    Assessment/Planning: POD #11 s/p left ax fem bypass for LLE  ischemia  Left foot is warm and well-perfused Continue anticoagulation as she will likely thrombose her bypass graft without it and this would result in a high left above-knee amputation if it fails Needs to work with therapy Need placement No other vascular recommendations at this point  [name]  08/03/2022, 10:16 AM

## 2022-08-03 NOTE — Progress Notes (Signed)
PROGRESS NOTE    Carla [name]  NOB:[id number] DOB: 12/08/1960 DOA: 07/22/2022 PCP: [facility] [place]    Brief Narrative:  Carla [name] is a 61 y.o. female with medical history significant for peripheral arterial disease s/p right AKA, chronic diastolic dysfunction CHF, anxiety and depression, COPD, paroxysmal A-fib on chronic anticoagulation therapy, who presents to the ED with left leg pain for the past 1 week which had been progressively worsening.  09/30: CTA found with ischemic LLE. The patient was evaluated by vascular surgeon, Dr. Trula Slade in the ED who will take her to the OR on Sunday 10/1.  Patient started on heparin. Admitted to hospitalist service.  10/01: pt went to OR for common femoral artery bypass graft, post OR woke up c/o leg pain, mottled leg, went back to OR for thrombectomy of Left axillary femoral bypass graft with revision. Transferred to ICU.  10/02: Started on cefepime for concern for abscess of right BKA stump 10/03-10/04: Left upper extremity swelling noted overnight, ultrasound pending as of this AM --> no DVT but (+)hematoma. D/w vascular - no procedures planned for this (expected w/ anticoagulation) and ok to transfer to floor. Pain control has been a challenge but she is better w/ anxiolytics.  10/05: Afib w/ occasional rapid rate and hypotension.  Hemoglobin down to 6.2.  Transfused 2 units PRBC, VS stabilized.  10/06: Hemoglobin 9.0 posttransfusion, 7.5 this morning.  Follow H/H midday today Hgb 7.7, hold off on transfusion for now, CBC in AM. PT recommending SNF. 10/07: concern for thrombosis to toes, vascular following w/ pharmacy for heparin management. Transfusing another unit blood for Hgb 7.0  10/08: continues to c/o pain LUE and RLE. Will image RLE --> CT (+) Low-density fluid collection along the distal aspect of the right femoral amputation. This is nonspecific and could be sterile or infected. No gas or  foreign body is seen associated with this  collection. No evidence of osteomyelitis or hip joint effusion. 10/09: Remains on heparin per vascular. Pain control still a challenge 10/11 IV Dilaudid discontinued.  Work with PT and sitting in chair today. 10/12 no overnight issues    Consultants:  Vascular Surgery    Procedures: 10/01: Left axillary to common femoral artery bypass graft to treat ischemia 10/01: Thrombectomy of left axillary femoral bypass graft with revision   Antimicrobials:  Cefazolin x3 doses  Cefepime 10/2 - 10/9       Subjective: Still c/o neuropathic pain. No sob or cp  Objective: Vitals:   08/02/22 2352 08/03/22 0403 08/03/22 0800 08/03/22 1136  BP: (!) 92/53 (!) 98/53 124/62 (!) 124/57  Pulse: 75 75 70 73  Resp: '20 18 16 16  '$ Temp: 98 F (36.7 C) 98.2 F (36.8 C) 98 F (36.7 C) 98.2 F (36.8 C)  TempSrc:   Oral   SpO2: 99% 99% 99% 99%  Weight:      Height:        Intake/Output Summary (Last 24 hours) at 08/03/2022 1600 Last data filed at 08/03/2022 1254 Gross per 24 hour  Intake --  Output 3850 ml  Net -3850 ml   Filed Weights   07/22/22 0029  Weight: 80.7 kg    Examination: Calm, NAD Cta no w/r Reg s1/s2 no gallop Soft benign +bs RT stump with dressing on it. LLE no edema. Mild UE edema b/l Aaoxox3  Mood and affect appropriate in current setting     Data Reviewed: I have personally reviewed following labs and imaging studies  CBC: Recent Labs  Lab 07/29/22 0501 07/29/22 1210 07/30/22 0429 07/30/22 2006 07/31/22 0429 08/01/22 0559 08/02/22 0424  WBC 12.8*  --  12.3*  --  10.3 9.5 7.3  HGB 7.0*   < > 9.0* 7.7* 7.9* 7.6* 7.4*  HCT 22.4*   < > 27.1* 24.2* 25.1* 24.8* 24.0*  MCV 91.4  --  88.3  --  89.6 91.9 93.0  PLT 155  --  198  --  218 232 235   < > = values in this interval not displayed.   Basic Metabolic Panel: Recent Labs  Lab 07/28/22 0629 07/30/22 0429 08/01/22 0559 08/02/22 0424  NA 138 138 139 141  K 4.1 3.7 4.0 3.9  CL 109 107 103 105   CO2 '26 28 30 29  '$ GLUCOSE 99 106* 117* 124*  BUN '18 17 18 16  '$ CREATININE 0.64 0.59 0.50 0.49  CALCIUM 7.9* 8.0* 8.3* 8.4*   GFR: Estimated Creatinine Clearance: 71.7 mL/min (by C-G formula based on SCr of 0.49 mg/dL). Liver Function Tests: No results for input(s): "AST", "ALT", "ALKPHOS", "BILITOT", "PROT", "ALBUMIN" in the last 168 hours. No results for input(s): "LIPASE", "AMYLASE" in the last 168 hours. No results for input(s): "AMMONIA" in the last 168 hours. Coagulation Profile: No results for input(s): "INR", "PROTIME" in the last 168 hours. Cardiac Enzymes: No results for input(s): "CKTOTAL", "CKMB", "CKMBINDEX", "TROPONINI" in the last 168 hours. BNP (last 3 results) No results for input(s): "PROBNP" in the last 8760 hours. HbA1C: No results for input(s): "HGBA1C" in the last 72 hours. CBG: Recent Labs  Lab 08/02/22 2053 08/02/22 2348 08/03/22 0400 08/03/22 0758 08/03/22 1134  GLUCAP 155* 181* 122* 112* 109*   Lipid Profile: No results for input(s): "CHOL", "HDL", "LDLCALC", "TRIG", "CHOLHDL", "LDLDIRECT" in the last 72 hours. Thyroid Function Tests: No results for input(s): "TSH", "T4TOTAL", "FREET4", "T3FREE", "THYROIDAB" in the last 72 hours. Anemia Panel: No results for input(s): "VITAMINB12", "FOLATE", "FERRITIN", "TIBC", "IRON", "RETICCTPCT" in the last 72 hours. Sepsis Labs: No results for input(s): "PROCALCITON", "LATICACIDVEN" in the last 168 hours.  No results found for this or any previous visit (from the past 240 hour(s)).       Radiology Studies: No results found.      Scheduled Meds:  sodium chloride   Intravenous Once   apixaban  10 mg Oral BID   Followed by   Derrill Memo ON 08/08/2022] apixaban  5 mg Oral BID   aspirin EC  81 mg Oral Q0600   atorvastatin  80 mg Oral QHS   Chlorhexidine Gluconate Cloth  6 each Topical Daily   citalopram  40 mg Oral Daily   docusate sodium  100 mg Oral Daily   insulin aspart  0-15 Units Subcutaneous Q4H    isosorbide mononitrate  60 mg Oral Daily   lidocaine  1 patch Transdermal Q24H   pantoprazole  40 mg Oral Daily   pregabalin  150 mg Oral TID   Continuous Infusions:  sodium chloride     magnesium sulfate bolus IVPB      Assessment & Plan:   Principal Problem:   Critical limb ischemia of left lower extremity (HCC) Active Problems:   AKI (acute kidney injury) ([place])   PAF (paroxysmal atrial fibrillation) (HCC)   Chronic diastolic CHF (congestive heart failure) (HCC)   Abscess of knee, right   Hematoma   Critical limb ischemia of left lower extremity ([place]) Peripheral artery disease s/p right BKA S/p OR initially 10/01 for Left axillary  to common femoral artery bypass graft, Prevena wound VAC placed .  Post surgery patient had left foot pain and mottled leg was taken to the OR again 10/01, thrombectomy of left axillofemoral bypass graft Continue on heparin per vascular surgery, home eliquis held (Hx Afib) Continuing aspirin, statin 10/12 Lt foot warm and well perfused.  Pain control Needs to work with therapy No other vascular rec.     Left upper extremity swelling Hematoma LUE Noted overnight 10/03-10/04 [place] LUE no DVT but there is large (6.6 x 6.3 x 6.5 cm (volume = 140 cm^3)) hematoma under the left axillary surgical site. D/w vascular - no procedures planned for this (expected w/ anticoagulation) and ok to transfer to floor 10/11 give blood work break to avoid more drop. 10/12 ck h/h in am   Acute blood loss anemia, surgical intervention, hematoma Status posttransfusion 2 units PRBC 07/27/2022, 1 unit PRBC 07/29/22  110/12 as above   Rt knee fluid collection concerning for abscess At site of amputation, found on CT concern for abscess vs non-infectious fluid collection Cefpime completed, has not helped - favor non-infectious process  Vascular following, avoiding procedures Continues to c/o pain despite course of abx, 10/08 will get XR preliminary but expect will need  CT --> XR ok 10/09: reviewed CT RLE (+) Low-density fluid collection along the distal aspect of the right femoral amputation. This is nonspecific and could be sterile or infected. No gas or  foreign body is seen associated with this collection. No evidence of osteomyelitis or hip joint effusion. 10/10: d/c heparin gtt per vascular, ok to transition to oral AC. Eliquis started. Pain control continues to be a challenge --> scheduled po Norco and reduced frequency IV Dilaudid, increased Lyrice to 150 mg tid - goal to wean off IV pain control in anticipation of discharge soon, now that she's off heparin.  10/12 dressing in place.    AKI - resolved Prerenal D/c'ed ivf Monitor BMP   Avoid nephrotoxic meds   PAF (paroxysmal atrial fibrillation) (HCC) Eliquis on hold for washout Continue metoprolol for rate control On Heparin for limb ischemia    Chronic diastolic CHF (congestive heart failure) (HCC) Clinically compensated Continue metoprolol and Imdur       DVT prophylaxis: eliquis Code Status:full Family Communication: GD at bedside Disposition Plan:  Status is: Inpatient Remains inpatient appropriate because: IV treatment. Needs SNF     LOS: 12 days   Time spent: 35 min    Nolberto Hanlon, MD [facility] Pager [phone]  If 7PM-7AM, please contact night-coverage 08/03/2022, 4:00 PM

## 2022-08-03 NOTE — Plan of Care (Signed)
  Problem: Nutrition: Goal: Adequate nutrition will be maintained Outcome: Progressing   Problem: Safety: Goal: Ability to remain free from injury will improve Outcome: Progressing   

## 2022-08-04 DIAGNOSIS — I70222 Atherosclerosis of native arteries of extremities with rest pain, left leg: Secondary | ICD-10-CM | POA: Diagnosis not present

## 2022-08-04 LAB — GLUCOSE, CAPILLARY
Glucose-Capillary: 106 mg/dL — ABNORMAL HIGH (ref 70–99)
Glucose-Capillary: 123 mg/dL — ABNORMAL HIGH (ref 70–99)
Glucose-Capillary: 135 mg/dL — ABNORMAL HIGH (ref 70–99)
Glucose-Capillary: 139 mg/dL — ABNORMAL HIGH (ref 70–99)
Glucose-Capillary: 92 mg/dL (ref 70–99)
Glucose-Capillary: 93 mg/dL (ref 70–99)
Glucose-Capillary: 97 mg/dL (ref 70–99)

## 2022-08-04 LAB — HEMOGLOBIN AND HEMATOCRIT, BLOOD
HCT: 26.1 % — ABNORMAL LOW (ref 36.0–46.0)
Hemoglobin: 8 g/dL — ABNORMAL LOW (ref 12.0–15.0)

## 2022-08-04 NOTE — Care Management Important Message (Signed)
Important Message  Patient Details  Name: Carla [name] MRN: [id number] Date of Birth: 1961/02/02   Medicare Important Message Given:  Yes     Dannette Barbara 08/04/2022, 1:46 PM

## 2022-08-04 NOTE — Progress Notes (Signed)
PROGRESS NOTE    Carla [name]  KVQ:[id number] DOB: 1961/10/16 DOA: 07/22/2022 PCP: [facility] [place]    Brief Narrative:  Carla [name] is a 61 y.o. female with medical history significant for peripheral arterial disease s/p right AKA, chronic diastolic dysfunction CHF, anxiety and depression, COPD, paroxysmal A-fib on chronic anticoagulation therapy, who presents to the ED with left leg pain for the past 1 week which had been progressively worsening.  09/30: CTA found with ischemic LLE. The patient was evaluated by vascular surgeon, Dr. Trula Slade in the ED who will take her to the OR on Sunday 10/1.  Patient started on heparin. Admitted to hospitalist service.  10/01: pt went to OR for common femoral artery bypass graft, post OR woke up c/o leg pain, mottled leg, went back to OR for thrombectomy of Left axillary femoral bypass graft with revision. Transferred to ICU.  10/02: Started on cefepime for concern for abscess of right BKA stump 10/03-10/04: Left upper extremity swelling noted overnight, ultrasound pending as of this AM --> no DVT but (+)hematoma. D/w vascular - no procedures planned for this (expected w/ anticoagulation) and ok to transfer to floor. Pain control has been a challenge but she is better w/ anxiolytics.  10/05: Afib w/ occasional rapid rate and hypotension.  Hemoglobin down to 6.2.  Transfused 2 units PRBC, VS stabilized.  10/06: Hemoglobin 9.0 posttransfusion, 7.5 this morning.  Follow H/H midday today Hgb 7.7, hold off on transfusion for now, CBC in AM. PT recommending SNF. 10/07: concern for thrombosis to toes, vascular following w/ pharmacy for heparin management. Transfusing another unit blood for Hgb 7.0  10/08: continues to c/o pain LUE and RLE. Will image RLE --> CT (+) Low-density fluid collection along the distal aspect of the right femoral amputation. This is nonspecific and could be sterile or infected. No gas or  foreign body is seen associated with this  collection. No evidence of osteomyelitis or hip joint effusion. 10/09: Remains on heparin per vascular. Pain control still a challenge 10/11 IV Dilaudid discontinued.  Work with PT and sitting in chair today. 10/12 no overnight issues 10/13 no overnight issues.    Consultants:  Vascular Surgery    Procedures: 10/01: Left axillary to common femoral artery bypass graft to treat ischemia 10/01: Thrombectomy of left axillary femoral bypass graft with revision   Antimicrobials:  Cefazolin x3 doses  Cefepime 10/2 - 10/9       Subjective: No new complaints.  No shortness of breath, chest pain, or new pain    Objective: Vitals:   08/03/22 1633 08/03/22 2032 08/04/22 0542 08/04/22 0810  BP: 112/60 114/60 (!) 102/57 (!) 110/57  Pulse: 73 78 74 72  Resp: 18   16  Temp: 98.4 F (36.9 C) 98.1 F (36.7 C) 98.2 F (36.8 C) 97.8 F (36.6 C)  TempSrc: Oral Oral    SpO2: 100% 100% 98% 100%  Weight:      Height:        Intake/Output Summary (Last 24 hours) at 08/04/2022 0835 Last data filed at 08/04/2022 0813 Gross per 24 hour  Intake --  Output 3400 ml  Net -3400 ml   Filed Weights   07/22/22 0029  Weight: 80.7 kg    Examination: Calm, NAD Cta no w/r Reg s1/s2 no gallop Soft benign +bs Rt aka, LLE trace edema Aaoxox3  Mood and affect appropriate in current setting     Data Reviewed: I have personally reviewed following labs and imaging  studies  CBC: Recent Labs  Lab 07/29/22 0501 07/29/22 1210 07/30/22 0429 07/30/22 2006 07/31/22 0429 08/01/22 0559 08/02/22 0424 08/04/22 0547  WBC 12.8*  --  12.3*  --  10.3 9.5 7.3  --   HGB 7.0*   < > 9.0* 7.7* 7.9* 7.6* 7.4* 8.0*  HCT 22.4*   < > 27.1* 24.2* 25.1* 24.8* 24.0* 26.1*  MCV 91.4  --  88.3  --  89.6 91.9 93.0  --   PLT 155  --  198  --  218 232 235  --    < > = values in this interval not displayed.   Basic Metabolic Panel: Recent Labs  Lab 07/30/22 0429 08/01/22 0559 08/02/22 0424  NA 138 139  141  K 3.7 4.0 3.9  CL 107 103 105  CO2 '28 30 29  '$ GLUCOSE 106* 117* 124*  BUN '17 18 16  '$ CREATININE 0.59 0.50 0.49  CALCIUM 8.0* 8.3* 8.4*   GFR: Estimated Creatinine Clearance: 71.7 mL/min (by C-G formula based on SCr of 0.49 mg/dL). Liver Function Tests: No results for input(s): "AST", "ALT", "ALKPHOS", "BILITOT", "PROT", "ALBUMIN" in the last 168 hours. No results for input(s): "LIPASE", "AMYLASE" in the last 168 hours. No results for input(s): "AMMONIA" in the last 168 hours. Coagulation Profile: No results for input(s): "INR", "PROTIME" in the last 168 hours. Cardiac Enzymes: No results for input(s): "CKTOTAL", "CKMB", "CKMBINDEX", "TROPONINI" in the last 168 hours. BNP (last 3 results) No results for input(s): "PROBNP" in the last 8760 hours. HbA1C: No results for input(s): "HGBA1C" in the last 72 hours. CBG: Recent Labs  Lab 08/03/22 1631 08/03/22 2018 08/04/22 0014 08/04/22 0545 08/04/22 0812  GLUCAP 162* 154* 97 106* 92   Lipid Profile: No results for input(s): "CHOL", "HDL", "LDLCALC", "TRIG", "CHOLHDL", "LDLDIRECT" in the last 72 hours. Thyroid Function Tests: No results for input(s): "TSH", "T4TOTAL", "FREET4", "T3FREE", "THYROIDAB" in the last 72 hours. Anemia Panel: No results for input(s): "VITAMINB12", "FOLATE", "FERRITIN", "TIBC", "IRON", "RETICCTPCT" in the last 72 hours. Sepsis Labs: No results for input(s): "PROCALCITON", "LATICACIDVEN" in the last 168 hours.  No results found for this or any previous visit (from the past 240 hour(s)).       Radiology Studies: No results found.      Scheduled Meds:  sodium chloride   Intravenous Once   apixaban  10 mg Oral BID   Followed by   Derrill Memo ON 08/08/2022] apixaban  5 mg Oral BID   aspirin EC  81 mg Oral Q0600   atorvastatin  80 mg Oral QHS   Chlorhexidine Gluconate Cloth  6 each Topical Daily   citalopram  40 mg Oral Daily   docusate sodium  100 mg Oral Daily   insulin aspart  0-15 Units  Subcutaneous Q4H   isosorbide mononitrate  60 mg Oral Daily   lidocaine  1 patch Transdermal Q24H   pantoprazole  40 mg Oral Daily   pregabalin  150 mg Oral TID   Continuous Infusions:  sodium chloride     magnesium sulfate bolus IVPB      Assessment & Plan:   Principal Problem:   Critical limb ischemia of left lower extremity (HCC) Active Problems:   AKI (acute kidney injury) ([place])   PAF (paroxysmal atrial fibrillation) (HCC)   Chronic diastolic CHF (congestive heart failure) (HCC)   Abscess of knee, right   Hematoma   Critical limb ischemia of left lower extremity ([place]) Peripheral artery disease s/p right BKA S/p OR  initially 10/01 for Left axillary to common femoral artery bypass graft, Prevena wound VAC placed .  Post surgery patient had left foot pain and mottled leg was taken to the OR again 10/01, thrombectomy of left axillofemoral bypass graft Continue on heparin per vascular surgery, home eliquis held (Hx Afib) Continuing aspirin, statin 10/12 Lt foot warm and well perfused.  Pain control Needs to work with therapy 10/13 no other vascular recommendations      Left upper extremity swelling Hematoma LUE Noted overnight 10/03-10/04 [place] LUE no DVT but there is large (6.6 x 6.3 x 6.5 cm (volume = 140 cm^3)) hematoma under the left axillary surgical site. D/w vascular - no procedures planned for this (expected w/ anticoagulation) and ok to transfer to floor 10/11 give blood work break to avoid more drop. 10/13 H&H stable   Acute blood loss anemia, surgical intervention, hematoma Status posttransfusion 2 units PRBC 07/27/2022, 1 unit PRBC 07/29/22  10/13 H&H stable   Rt knee fluid collection concerning for abscess At site of amputation, found on CT concern for abscess vs non-infectious fluid collection Cefpime completed, has not helped - favor non-infectious process  Vascular following, avoiding procedures Continues to c/o pain despite course of abx, 10/08 will get  XR preliminary but expect will need CT --> XR ok 10/09: reviewed CT RLE (+) Low-density fluid collection along the distal aspect of the right femoral amputation. This is nonspecific and could be sterile or infected. No gas or  foreign body is seen associated with this collection. No evidence of osteomyelitis or hip joint effusion. 10/10: d/c heparin gtt per vascular, ok to transition to oral AC. Eliquis started. Pain control continues to be a challenge --> scheduled po Norco and reduced frequency IV Dilaudid, increased Lyrice to 150 mg tid - goal to wean off IV pain control in anticipation of discharge soon, now that she's off heparin.  10/13 dressing in place     AKI - resolved Prerenal D/c'ed ivf Monitor BMP   Avoid nephrotoxic meds   PAF (paroxysmal atrial fibrillation) (HCC) On Eliquis and metoprolol     Chronic diastolic CHF (congestive heart failure) (HCC) Clinically compensated Continue metoprolol and Imdur       DVT prophylaxis: eliquis Code Status:full Family Communication: None at bedside Disposition Plan:  Status is: Inpatient Remains inpatient appropriate because and safe discharge needs SNF     LOS: 13 days   Time spent: 35 min    Nolberto Hanlon, MD [facility] Pager [phone]  If 7PM-7AM, please contact night-coverage 08/04/2022, 8:35 AM

## 2022-08-05 DIAGNOSIS — I70222 Atherosclerosis of native arteries of extremities with rest pain, left leg: Secondary | ICD-10-CM | POA: Diagnosis not present

## 2022-08-05 LAB — GLUCOSE, CAPILLARY
Glucose-Capillary: 108 mg/dL — ABNORMAL HIGH (ref 70–99)
Glucose-Capillary: 110 mg/dL — ABNORMAL HIGH (ref 70–99)
Glucose-Capillary: 107 mg/dL — ABNORMAL HIGH (ref 70–99)
Glucose-Capillary: 161 mg/dL — ABNORMAL HIGH (ref 70–99)
Glucose-Capillary: 103 mg/dL — ABNORMAL HIGH (ref 70–99)

## 2022-08-05 NOTE — Progress Notes (Signed)
PROGRESS NOTE    Carla [name]  UMP:[id number] DOB: 15-Jan-1961 DOA: 07/22/2022 PCP: [facility]    Brief Narrative:  Carla [name] is a 61 y.o. female with medical history significant for peripheral arterial disease s/p right AKA, chronic diastolic dysfunction CHF, anxiety and depression, COPD, paroxysmal A-fib on chronic anticoagulation therapy, who presents to the ED with left leg pain for the past 1 week which had been progressively worsening.  09/30: CTA found with ischemic LLE. The patient was evaluated by vascular surgeon, Dr. Trula Slade in the ED who will take her to the OR on Sunday 10/1.  Patient started on heparin. Admitted to hospitalist service.  10/01: pt went to OR for common femoral artery bypass graft, post OR woke up c/o leg pain, mottled leg, went back to OR for thrombectomy of Left axillary femoral bypass graft with revision. Transferred to ICU.  10/02: Started on cefepime for concern for abscess of right BKA stump 10/03-10/04: Left upper extremity swelling noted overnight, ultrasound pending as of this AM --> no DVT but (+)hematoma. D/w vascular - no procedures planned for this (expected w/ anticoagulation) and ok to transfer to floor. Pain control has been a challenge but she is better w/ anxiolytics.  10/05: Afib w/ occasional rapid rate and hypotension.  Hemoglobin down to 6.2.  Transfused 2 units PRBC, VS stabilized.  10/06: Hemoglobin 9.0 posttransfusion, 7.5 this morning.  Follow H/H midday today Hgb 7.7, hold off on transfusion for now, CBC in AM. PT recommending SNF. 10/07: concern for thrombosis to toes, vascular following w/ pharmacy for heparin management. Transfusing another unit blood for Hgb 7.0  10/08: continues to c/o pain LUE and RLE. Will image RLE --> CT (+) Low-density fluid collection along the distal aspect of the right femoral amputation. This is nonspecific and could be sterile or infected. No gas or  foreign body is seen associated with this  collection. No evidence of osteomyelitis or hip joint effusion. 10/09: Remains on heparin per vascular. Pain control still a challenge 10/11 IV Dilaudid discontinued.  Work with PT and sitting in chair today. 10/12 no overnight issues 10/14 no overnight issues    Consultants:  Vascular Surgery    Procedures: 10/01: Left axillary to common femoral artery bypass graft to treat ischemia 10/01: Thrombectomy of left axillary femoral bypass graft with revision   Antimicrobials:  Cefazolin x3 doses  Cefepime 10/2 - 10/9       Subjective: No sob, cp, still with pain on her bruised lateral back.     Objective: Vitals:   08/04/22 1958 08/05/22 0027 08/05/22 0442 08/05/22 0806  BP: (!) 94/51 (!) 114/59 (!) 110/56 118/75  Pulse: 73 74 74 64  Resp: '18 18 19 16  '$ Temp: 97.6 F (36.4 C) 97.7 F (36.5 C) 98.4 F (36.9 C) 97.8 F (36.6 C)  TempSrc:      SpO2: 100% 99% 100% 100%  Weight:      Height:        Intake/Output Summary (Last 24 hours) at 08/05/2022 0930 Last data filed at 08/05/2022 4008 Gross per 24 hour  Intake --  Output 1100 ml  Net -1100 ml   Filed Weights   07/22/22 0029  Weight: 80.7 kg    Examination: Calm, NAD Cta no w/r Reg s1/s2 no gallop Soft benign +bs Left lateral back: brusing improving No edema Aaoxox3  Mood and affect appropriate in current setting     Data Reviewed: I have personally reviewed following labs  and imaging studies  CBC: Recent Labs  Lab 07/30/22 0429 07/30/22 2006 07/31/22 0429 08/01/22 0559 08/02/22 0424 08/04/22 0547  WBC 12.3*  --  10.3 9.5 7.3  --   HGB 9.0* 7.7* 7.9* 7.6* 7.4* 8.0*  HCT 27.1* 24.2* 25.1* 24.8* 24.0* 26.1*  MCV 88.3  --  89.6 91.9 93.0  --   PLT 198  --  218 232 235  --    Basic Metabolic Panel: Recent Labs  Lab 07/30/22 0429 08/01/22 0559 08/02/22 0424  NA 138 139 141  K 3.7 4.0 3.9  CL 107 103 105  CO2 '28 30 29  '$ GLUCOSE 106* 117* 124*  BUN '17 18 16  '$ CREATININE 0.59 0.50 0.49   CALCIUM 8.0* 8.3* 8.4*   GFR: Estimated Creatinine Clearance: 71.7 mL/min (by C-G formula based on SCr of 0.49 mg/dL). Liver Function Tests: No results for input(s): "AST", "ALT", "ALKPHOS", "BILITOT", "PROT", "ALBUMIN" in the last 168 hours. No results for input(s): "LIPASE", "AMYLASE" in the last 168 hours. No results for input(s): "AMMONIA" in the last 168 hours. Coagulation Profile: No results for input(s): "INR", "PROTIME" in the last 168 hours. Cardiac Enzymes: No results for input(s): "CKTOTAL", "CKMB", "CKMBINDEX", "TROPONINI" in the last 168 hours. BNP (last 3 results) No results for input(s): "PROBNP" in the last 8760 hours. HbA1C: No results for input(s): "HGBA1C" in the last 72 hours. CBG: Recent Labs  Lab 08/04/22 1646 08/04/22 2002 08/04/22 2352 08/05/22 0503 08/05/22 0804  GLUCAP 123* 139* 135* 108* 110*   Lipid Profile: No results for input(s): "CHOL", "HDL", "LDLCALC", "TRIG", "CHOLHDL", "LDLDIRECT" in the last 72 hours. Thyroid Function Tests: No results for input(s): "TSH", "T4TOTAL", "FREET4", "T3FREE", "THYROIDAB" in the last 72 hours. Anemia Panel: No results for input(s): "VITAMINB12", "FOLATE", "FERRITIN", "TIBC", "IRON", "RETICCTPCT" in the last 72 hours. Sepsis Labs: No results for input(s): "PROCALCITON", "LATICACIDVEN" in the last 168 hours.  No results found for this or any previous visit (from the past 240 hour(s)).       Radiology Studies: No results found.      Scheduled Meds:  sodium chloride   Intravenous Once   apixaban  10 mg Oral BID   Followed by   Derrill Memo ON 08/08/2022] apixaban  5 mg Oral BID   aspirin EC  81 mg Oral Q0600   atorvastatin  80 mg Oral QHS   Chlorhexidine Gluconate Cloth  6 each Topical Daily   citalopram  40 mg Oral Daily   docusate sodium  100 mg Oral Daily   insulin aspart  0-15 Units Subcutaneous Q4H   isosorbide mononitrate  60 mg Oral Daily   lidocaine  1 patch Transdermal Q24H   pantoprazole  40  mg Oral Daily   pregabalin  150 mg Oral TID   Continuous Infusions:  sodium chloride     magnesium sulfate bolus IVPB      Assessment & Plan:   Principal Problem:   Critical limb ischemia of left lower extremity (HCC) Active Problems:   AKI (acute kidney injury) ([place])   PAF (paroxysmal atrial fibrillation) (HCC)   Chronic diastolic CHF (congestive heart failure) (HCC)   Abscess of knee, right   Hematoma   Critical limb ischemia of left lower extremity ([place]) Peripheral artery disease s/p right BKA S/p OR initially 10/01 for Left axillary to common femoral artery bypass graft, Prevena wound VAC placed .  Post surgery patient had left foot pain and mottled leg was taken to the OR again 10/01, thrombectomy  of left axillofemoral bypass graft Continue on heparin per vascular surgery, home eliquis held (Hx Afib) Continuing aspirin, statin 10/12 Lt foot warm and well perfused.  Pain control Needs to work with therapy 10/14 no other vascular recommendations  Pending     Left upper extremity swelling Hematoma LUE Noted overnight 10/03-10/04 [place] LUE no DVT but there is large (6.6 x 6.3 x 6.5 cm (volume = 140 cm^3)) hematoma under the left axillary surgical site. D/w vascular - no procedures planned for this (expected w/ anticoagulation) and ok to transfer to floor 10/11 give blood work break to avoid more drop. 10/14 H&H stable     Acute blood loss anemia, surgical intervention, hematoma Status posttransfusion 2 units PRBC 07/27/2022, 1 unit PRBC 07/29/22  10/14 H&H stable    Rt knee fluid collection concerning for abscess At site of amputation, found on CT concern for abscess vs non-infectious fluid collection Cefpime completed, has not helped - favor non-infectious process  Vascular following, avoiding procedures Continues to c/o pain despite course of abx, 10/08 will get XR preliminary but expect will need CT --> XR ok 10/09: reviewed CT RLE (+) Low-density fluid collection  along the distal aspect of the right femoral amputation. This is nonspecific and could be sterile or infected. No gas or  foreign body is seen associated with this collection. No evidence of osteomyelitis or hip joint effusion. 10/10: d/c heparin gtt per vascular, ok to transition to oral AC. Eliquis started. Pain control continues to be a challenge --> scheduled po Norco and reduced frequency IV Dilaudid, increased Lyrice to 150 mg tid - goal to wean off IV pain control in anticipation of discharge soon, now that she's off heparin.  10/14 dressing in place      AKI - resolved Prerenal D/c'ed ivf Monitor BMP   Avoid nephrotoxic meds   PAF (paroxysmal atrial fibrillation) (HCC) On Eliquis and metoprolol     Chronic diastolic CHF (congestive heart failure) (HCC) Clinically compensated Continue metoprolol and Imdur       DVT prophylaxis: eliquis Code Status:full Family Communication: None at bedside Disposition Plan:  Status is: Inpatient Remains inpatient appropriate because and safe discharge needs SNF     LOS: 14 days   Time spent: 35 min    Nolberto Hanlon, MD [facility] Pager [phone]  If 7PM-7AM, please contact night-coverage 08/05/2022, 9:30 AM

## 2022-08-05 NOTE — Progress Notes (Signed)
Physical Therapy Treatment Patient Details Name: Carla [name] MRN: [id number] DOB: 1961/08/19 Today's Date: 08/05/2022   History of Present Illness Pt is a 61 year old female s/p OR initially for Left axillary to common femoral artery bypass graft, Prevena wound VAC placed .  Post surgery patient had left foot pain and mottled leg was taken to the OR again , now status post thrombectomy of left axillofemoral bypass graft after presentingto the ED with progressively worsening left leg pain; PMH significant for arterial disease s/p right AKA, chronic diastolic dysfunction CHF, anxiety and depression, COPD, paroxysmal A-fib on chronic anticoagulation therapy    PT Comments    Pt was long sitting in bed upon arriving. She is A and O x 4 but endorsing 8/10 pain. Does agree to session however unwilling to attempt standing. Author questions if pt's fear of falling is limiting her more so than physical ailments. She was agreeable to transfer OOB to recliner via slideboard. Pt was able to exit L side of bed. Slideboard transfer performed towards the R with armrest of chair lowered. Transferred from minimally elevated bed height but does require increased time + min A to perform. Pt was hoyer lift transferred back into bed later in the afternoon. Author recommend DC to SNF to maximize independence prior to returning home. Pt does voice not wanting to go back to a STR due to past experience. Acute PT will continue to follow per current POC.   Recommendations for follow up therapy are one component of a multi-disciplinary discharge planning process, led by the attending physician.  Recommendations may be updated based on patient status, additional functional criteria and insurance authorization.  Follow Up Recommendations  Skilled nursing-short term rehab (<3 hours/day)     Assistance Recommended at Discharge Frequent or constant Supervision/Assistance  Patient can return home with the following Two people  to help with walking and/or transfers;Two people to help with bathing/dressing/bathroom;Assistance with cooking/housework;Assist for transportation;Help with stairs or ramp for entrance   Equipment Recommendations   (slideboard if not planning to DC to SNF. she has w/c and RW already.)       Precautions / Restrictions Precautions Precautions: Fall Precaution Comments: dressing changed on distal R stump 10/13 Restrictions Weight Bearing Restrictions: No     Mobility  Bed Mobility Overal bed mobility: Needs Assistance Bed Mobility: Supine to Sit Rolling: Mod assist   Supine to sit: Mod assist, Max assist     General bed mobility comments: pt continues to require extensive asistance to safely progress to EOB short sit    Transfers Overall transfer level: Needs assistance Equipment used: Sliding board Transfers: Bed to chair/wheelchair/BSC Sit to Stand:  (pt unwilling due to fear)          Lateral/Scoot Transfers: From elevated surface, With slide board, Min assist General transfer comment: pt was able to perform slideboard transfer to recliner with min assist only. pt planning to DC home with asistance. recommend continued transfer training via slideboards or stand pivot to maximize independence    Ambulation/Gait               General Gait Details: Non-ambulatory at this time   Stairs             Wheelchair Mobility    Modified Rankin (Stroke Patients Only)       Balance Overall balance assessment: Needs assistance Sitting-balance support: Bilateral upper extremity supported, Feet supported Sitting balance-[name]: Fair Sitting balance - Comments: pt is able to  maintain sitting balance with close supervision                                    Cognition Arousal/Alertness: Awake/alert Behavior During Therapy: WFL for tasks assessed/performed, Anxious Overall Cognitive Status: Within Functional Limits for tasks assessed                 General Comments: Pt is A and oriented however unwilling to attemptbstanding due to anxiety. was agreeable to slideboard transfer to recliner towards R with armrest lowered           General Comments General comments (skin integrity, edema, etc.): reviewed ther ex handout and importance of her performing. "I havent done it yet.Ill do it tonight."      Pertinent Vitals/Pain Pain Assessment Pain Assessment: 0-10 Pain Score: 9  Faces Pain Scale: Hurts even more Pain Location: L knee and back/shoulder Pain Descriptors / Indicators: Crying, Discomfort, Moaning Pain Intervention(s): Limited activity within patient's tolerance, Premedicated before session, Monitored during session, Repositioned     PT Goals (current goals can now be found in the care plan section) Acute Rehab PT Goals Patient Stated Goal: go home when safe Progress towards PT goals: Progressing toward goals    Frequency    Min 2X/week      PT Plan Current plan remains appropriate    Co-evaluation     PT goals addressed during session: Mobility/safety with mobility;Balance;Proper use of DME;Strengthening/ROM        AM-PAC PT "6 Clicks" Mobility   Outcome Measure  Help needed turning from your back to your side while in a flat bed without using bedrails?: A Lot Help needed moving from lying on your back to sitting on the side of a flat bed without using bedrails?: A Lot Help needed moving to and from a bed to a chair (including a wheelchair)?: A Lot Help needed standing up from a chair using your arms (e.g., wheelchair or bedside chair)?: Total Help needed to walk in hospital room?: Total Help needed climbing 3-5 steps with a railing? : Total 6 Click Score: 9    End of Session Equipment Utilized During Treatment: Oxygen (slideboard) Activity Tolerance: Patient tolerated treatment well;Patient limited by pain;Patient limited by fatigue Patient left:  (pt retunred to bed later in afternoon  via hoyer lift due to unwillingess to attempt stand pivot) Nurse Communication: Mobility status PT Visit Diagnosis: Other abnormalities of gait and mobility (R26.89);Muscle weakness (generalized) (M62.81);Pain Pain - Right/Left: Left Pain - part of body: Arm     Time: 1418-1440 PT Time Calculation (min) (ACUTE ONLY): 22 min  Charges:  $Therapeutic Activity: 8-22 mins                     Julaine Fusi PTA 08/05/22, 6:56 PM

## 2022-08-05 NOTE — Progress Notes (Addendum)
Physical Therapy Treatment Patient Details Name: Carla [name] MRN: [id number] DOB: October 08, 1961 Today's Date: 08/05/2022   History of Present Illness Pt is a 61 year old female s/p OR initially for Left axillary to common femoral artery bypass graft, Prevena wound VAC placed .  Post surgery patient had left foot pain and mottled leg was taken to the OR again , now status post thrombectomy of left axillofemoral bypass graft after presentingto the ED with progressively worsening left leg pain; PMH significant for arterial disease s/p right AKA, chronic diastolic dysfunction CHF, anxiety and depression, COPD, paroxysmal A-fib on chronic anticoagulation therapy    PT Comments    Pt was long sitting bed upon arrival. She is A and O x 4 and agreeable to PT session. Pt was able to transition from supine to EOB short sit with extensive assist. Maintained sitting balance without physical assistance x 20 minute. Extensive assistance required to return to supine. She then agreed to participation in there ex. Amputee handout given with pt performing. Overall pt tolerated session well. She will require ongoing physical therapy moving forward to maximize independence with all ADLs.     Recommendations for follow up therapy are one component of a multi-disciplinary discharge planning process, led by the attending physician.  Recommendations may be updated based on patient status, additional functional criteria and insurance authorization.  Follow Up Recommendations  Skilled nursing-short term rehab (<3 hours/day) Can patient physically be transported by private vehicle: No   Assistance Recommended at Discharge Frequent or constant Supervision/Assistance  Patient can return home with the following Two people to help with walking and/or transfers;Two people to help with bathing/dressing/bathroom;Assistance with cooking/housework;Assist for transportation;Help with stairs or ramp for entrance         Precautions  / Restrictions Precautions Precautions: Fall Precaution Comments: wound vac RLE Restrictions Weight Bearing Restrictions: No     Mobility  Bed Mobility Overal bed mobility: Needs Assistance Bed Mobility: Supine to Sit Rolling: Min assist, Mod assist   Supine to sit: Max assist Sit to supine: Max assist   Transfers  General transfer comment: Pt unwilling to attempt transfer but did discusss with pt nee to consider using slideboard to get into/out of her personal w/c. Pt requested getting power chair at DC.      Balance Overall balance assessment: Needs assistance Sitting-balance support: Bilateral upper extremity supported, Feet supported Sitting balance-[name]: Fair Sitting balance - Comments: pt was able to sit EOB without physical assistance         Cognition Arousal/Alertness: Awake/alert Behavior During Therapy: WFL for tasks assessed/performed, Anxious Overall Cognitive Status: Within Functional Limits for tasks assessed        Orientation Level: Disoriented to, Situation   General Comments: Pt is Alert and Oriented however author questions if pt truely understands situation and severity of her deficits.        Exercises Amputee Exercises Gluteal Sets: 10 reps Hip Extension: AROM, 15 reps        Pertinent Vitals/Pain Pain Assessment Pain Location: L knee and back/shoulder Pain Descriptors / Indicators: Crying, Discomfort, Moaning     PT Goals (current goals can now be found in the care plan section) Acute Rehab PT Goals Patient Stated Goal: Go home when safe    Frequency    Min 2X/week      PT Plan Current plan remains appropriate       AM-PAC PT "6 Clicks" Mobility   Outcome Measure  Help needed turning from your  back to your side while in a flat bed without using bedrails?: A Lot Help needed moving from lying on your back to sitting on the side of a flat bed without using bedrails?: A Lot Help needed moving to and from a bed to a  chair (including a wheelchair)?: A Lot Help needed standing up from a chair using your arms (e.g., wheelchair or bedside chair)?: Total Help needed to walk in hospital room?: Total Help needed climbing 3-5 steps with a railing? : Total 6 Click Score: 9    End of Session Equipment Utilized During Treatment: Oxygen Activity Tolerance: Patient tolerated treatment well Patient left: in bed;with call bell/phone within reach;with bed alarm set Nurse Communication: Mobility status PT Visit Diagnosis: Other abnormalities of gait and mobility (R26.89);Muscle weakness (generalized) (M62.81);Pain Pain - Right/Left: Left Pain - part of body: Arm     Time:  -     Charges:                       Julaine Fusi PTA 08/05/22, 7:07 AM

## 2022-08-06 DIAGNOSIS — I70222 Atherosclerosis of native arteries of extremities with rest pain, left leg: Secondary | ICD-10-CM | POA: Diagnosis not present

## 2022-08-06 LAB — GLUCOSE, CAPILLARY
Glucose-Capillary: 107 mg/dL — ABNORMAL HIGH (ref 70–99)
Glucose-Capillary: 120 mg/dL — ABNORMAL HIGH (ref 70–99)
Glucose-Capillary: 122 mg/dL — ABNORMAL HIGH (ref 70–99)
Glucose-Capillary: 145 mg/dL — ABNORMAL HIGH (ref 70–99)
Glucose-Capillary: 151 mg/dL — ABNORMAL HIGH (ref 70–99)
Glucose-Capillary: 158 mg/dL — ABNORMAL HIGH (ref 70–99)
Glucose-Capillary: 176 mg/dL — ABNORMAL HIGH (ref 70–99)

## 2022-08-06 NOTE — Plan of Care (Signed)
  Problem: Education: Goal: Knowledge of General Education information will improve Description: Including pain rating scale, medication(s)/side effects and non-pharmacologic comfort measures Outcome: Progressing   Problem: Health Behavior/Discharge Planning: Goal: Ability to manage health-related needs will improve Outcome: Progressing   Problem: Clinical Measurements: Goal: Ability to maintain clinical measurements within normal limits will improve Outcome: Progressing Goal: Will remain free from infection Outcome: Progressing Goal: Diagnostic test results will improve Outcome: Progressing Goal: Respiratory complications will improve Outcome: Progressing Goal: Cardiovascular complication will be avoided Outcome: Progressing   Problem: Activity: Goal: Risk for activity intolerance will decrease Outcome: Progressing   Problem: Nutrition: Goal: Adequate nutrition will be maintained Outcome: Progressing   Problem: Coping: Goal: Level of anxiety will decrease Outcome: Progressing   Problem: Elimination: Goal: Will not experience complications related to bowel motility Outcome: Progressing Goal: Will not experience complications related to urinary retention Outcome: Progressing   Problem: Pain Managment: Goal: General experience of comfort will improve Outcome: Progressing   Problem: Safety: Goal: Ability to remain free from injury will improve Outcome: Progressing   Problem: Skin Integrity: Goal: Risk for impaired skin integrity will decrease Outcome: Progressing   Problem: Education: Goal: Knowledge of prescribed regimen will improve Outcome: Progressing   Problem: Activity: Goal: Ability to tolerate increased activity will improve Outcome: Progressing   Problem: Bowel/Gastric: Goal: Gastrointestinal status for postoperative course will improve Outcome: Progressing   Problem: Clinical Measurements: Goal: Postoperative complications will be avoided or  minimized Outcome: Progressing Goal: Signs and symptoms of graft occlusion will improve Outcome: Progressing   Problem: Skin Integrity: Goal: Demonstration of wound healing without infection will improve Outcome: Progressing   Problem: Education: Goal: Ability to describe self-care measures that may prevent or decrease complications (Diabetes Survival Skills Education) will improve Outcome: Progressing Goal: Individualized Educational Video(s) Outcome: Progressing   Problem: Coping: Goal: Ability to adjust to condition or change in health will improve Outcome: Progressing   Problem: Fluid Volume: Goal: Ability to maintain a balanced intake and output will improve Outcome: Progressing   Problem: Health Behavior/Discharge Planning: Goal: Ability to identify and utilize available resources and services will improve Outcome: Progressing Goal: Ability to manage health-related needs will improve Outcome: Progressing   Problem: Metabolic: Goal: Ability to maintain appropriate glucose levels will improve Outcome: Progressing   Problem: Nutritional: Goal: Maintenance of adequate nutrition will improve Outcome: Progressing Goal: Progress toward achieving an optimal weight will improve Outcome: Progressing   Problem: Skin Integrity: Goal: Risk for impaired skin integrity will decrease Outcome: Progressing   Problem: Tissue Perfusion: Goal: Adequacy of tissue perfusion will improve Outcome: Progressing

## 2022-08-06 NOTE — Progress Notes (Signed)
PROGRESS NOTE    Carla [name]  YBO:[id number] DOB: 14-Apr-1961 DOA: 07/22/2022 PCP: [facility] [place]    Brief Narrative:  Carla [name] is a 61 y.o. female with medical history significant for peripheral arterial disease s/p right AKA, chronic diastolic dysfunction CHF, anxiety and depression, COPD, paroxysmal A-fib on chronic anticoagulation therapy, who presents to the ED with left leg pain for the past 1 week which had been progressively worsening.  09/30: CTA found with ischemic LLE. The patient was evaluated by vascular surgeon, Dr. Trula Slade in the ED who will take her to the OR on Sunday 10/1.  Patient started on heparin. Admitted to hospitalist service.  10/01: pt went to OR for common femoral artery bypass graft, post OR woke up c/o leg pain, mottled leg, went back to OR for thrombectomy of Left axillary femoral bypass graft with revision. Transferred to ICU.  10/02: Started on cefepime for concern for abscess of right BKA stump 10/03-10/04: Left upper extremity swelling noted overnight, ultrasound pending as of this AM --> no DVT but (+)hematoma. D/w vascular - no procedures planned for this (expected w/ anticoagulation) and ok to transfer to floor. Pain control has been a challenge but she is better w/ anxiolytics.  10/05: Afib w/ occasional rapid rate and hypotension.  Hemoglobin down to 6.2.  Transfused 2 units PRBC, VS stabilized.  10/06: Hemoglobin 9.0 posttransfusion, 7.5 this morning.  Follow H/H midday today Hgb 7.7, hold off on transfusion for now, CBC in AM. PT recommending SNF. 10/07: concern for thrombosis to toes, vascular following w/ pharmacy for heparin management. Transfusing another unit blood for Hgb 7.0  10/08: continues to c/o pain LUE and RLE. Will image RLE --> CT (+) Low-density fluid collection along the distal aspect of the right femoral amputation. This is nonspecific and could be sterile or infected. No gas or  foreign body is seen associated with this  collection. No evidence of osteomyelitis or hip joint effusion. 10/09: Remains on heparin per vascular. Pain control still a challenge 10/11 IV Dilaudid discontinued.  Work with PT and sitting in chair today. 10/12 no overnight issues 10/15 no new issues    Consultants:  Vascular Surgery    Procedures: 10/01: Left axillary to common femoral artery bypass graft to treat ischemia 10/01: Thrombectomy of left axillary femoral bypass graft with revision   Antimicrobials:  Cefazolin x3 doses  Cefepime 10/2 - 10/9       Subjective: No complaints this AM.  No shortness of breath or chest pain.    Objective: Vitals:   08/06/22 0450 08/06/22 0727 08/06/22 0910 08/06/22 1132  BP: (!) 102/49 (!) 83/64 (!) 113/59 (!) 98/49  Pulse: 71 75 66 78  Resp: '16 16  18  '$ Temp: 98.3 F (36.8 C) 98.6 F (37 C)  98.4 F (36.9 C)  TempSrc: Oral     SpO2: 98% 100%  100%  Weight:      Height:        Intake/Output Summary (Last 24 hours) at 08/06/2022 1503 Last data filed at 08/06/2022 0900 Gross per 24 hour  Intake 940 ml  Output 3300 ml  Net -2360 ml   Filed Weights   07/22/22 0029  Weight: 80.7 kg    Examination: Calm, NAD Cta no w/r Reg s1/s2 no gallop Soft benign +bs Rt aka, trace edema LLE Aaoxox3  Mood and affect appropriate in current setting     Data Reviewed: I have personally reviewed following labs and imaging studies  CBC: Recent Labs  Lab 07/30/22 2006 07/31/22 0429 08/01/22 0559 08/02/22 0424 08/04/22 0547  WBC  --  10.3 9.5 7.3  --   HGB 7.7* 7.9* 7.6* 7.4* 8.0*  HCT 24.2* 25.1* 24.8* 24.0* 26.1*  MCV  --  89.6 91.9 93.0  --   PLT  --  218 232 235  --    Basic Metabolic Panel: Recent Labs  Lab 08/01/22 0559 08/02/22 0424  NA 139 141  K 4.0 3.9  CL 103 105  CO2 30 29  GLUCOSE 117* 124*  BUN 18 16  CREATININE 0.50 0.49  CALCIUM 8.3* 8.4*   GFR: Estimated Creatinine Clearance: 71.7 mL/min (by C-G formula based on SCr of 0.49  mg/dL). Liver Function Tests: No results for input(s): "AST", "ALT", "ALKPHOS", "BILITOT", "PROT", "ALBUMIN" in the last 168 hours. No results for input(s): "LIPASE", "AMYLASE" in the last 168 hours. No results for input(s): "AMMONIA" in the last 168 hours. Coagulation Profile: No results for input(s): "INR", "PROTIME" in the last 168 hours. Cardiac Enzymes: No results for input(s): "CKTOTAL", "CKMB", "CKMBINDEX", "TROPONINI" in the last 168 hours. BNP (last 3 results) No results for input(s): "PROBNP" in the last 8760 hours. HbA1C: No results for input(s): "HGBA1C" in the last 72 hours. CBG: Recent Labs  Lab 08/05/22 2023 08/06/22 0000 08/06/22 0403 08/06/22 0724 08/06/22 1129  GLUCAP 103* 151* 122* 107* 176*   Lipid Profile: No results for input(s): "CHOL", "HDL", "LDLCALC", "TRIG", "CHOLHDL", "LDLDIRECT" in the last 72 hours. Thyroid Function Tests: No results for input(s): "TSH", "T4TOTAL", "FREET4", "T3FREE", "THYROIDAB" in the last 72 hours. Anemia Panel: No results for input(s): "VITAMINB12", "FOLATE", "FERRITIN", "TIBC", "IRON", "RETICCTPCT" in the last 72 hours. Sepsis Labs: No results for input(s): "PROCALCITON", "LATICACIDVEN" in the last 168 hours.  No results found for this or any previous visit (from the past 240 hour(s)).       Radiology Studies: No results found.      Scheduled Meds:  sodium chloride   Intravenous Once   apixaban  10 mg Oral BID   Followed by   Derrill Memo ON 08/08/2022] apixaban  5 mg Oral BID   aspirin EC  81 mg Oral Q0600   atorvastatin  80 mg Oral QHS   Chlorhexidine Gluconate Cloth  6 each Topical Daily   citalopram  40 mg Oral Daily   docusate sodium  100 mg Oral Daily   insulin aspart  0-15 Units Subcutaneous Q4H   isosorbide mononitrate  60 mg Oral Daily   lidocaine  1 patch Transdermal Q24H   pantoprazole  40 mg Oral Daily   pregabalin  150 mg Oral TID   Continuous Infusions:  sodium chloride     magnesium sulfate  bolus IVPB      Assessment & Plan:   Principal Problem:   Critical limb ischemia of left lower extremity (HCC) Active Problems:   AKI (acute kidney injury) ([place])   PAF (paroxysmal atrial fibrillation) (HCC)   Chronic diastolic CHF (congestive heart failure) (HCC)   Abscess of knee, right   Hematoma   Critical limb ischemia of left lower extremity ([place]) Peripheral artery disease s/p right BKA S/p OR initially 10/01 for Left axillary to common femoral artery bypass graft, Prevena wound VAC placed .  Post surgery patient had left foot pain and mottled leg was taken to the OR again 10/01, thrombectomy of left axillofemoral bypass graft Continue on heparin per vascular surgery, home eliquis held (Hx Afib) Continuing aspirin, statin 10/12 Lt  foot warm and well perfused.  Pain control Needs to work with therapy 10/15 no other vascular recommendations        Left upper extremity swelling Hematoma LUE Noted overnight 10/03-10/04 [place] LUE no DVT but there is large (6.6 x 6.3 x 6.5 cm (volume = 140 cm^3)) hematoma under the left axillary surgical site. D/w vascular - no procedures planned for this (expected w/ anticoagulation) and ok to transfer to floor 10/11 give blood work break to avoid more drop. 10/15 H&H stable    Acute blood loss anemia, surgical intervention, hematoma Status posttransfusion 2 units PRBC 07/27/2022, 1 unit PRBC 07/29/22  10/15 H&H stable    Rt knee fluid collection concerning for abscess At site of amputation, found on CT concern for abscess vs non-infectious fluid collection Cefpime completed, has not helped - favor non-infectious process  Vascular following, avoiding procedures Continues to c/o pain despite course of abx, 10/08 will get XR preliminary but expect will need CT --> XR ok 10/09: reviewed CT RLE (+) Low-density fluid collection along the distal aspect of the right femoral amputation. This is nonspecific and could be sterile or infected. No gas  or  foreign body is seen associated with this collection. No evidence of osteomyelitis or hip joint effusion. 10/10: d/c heparin gtt per vascular, ok to transition to oral AC. Eliquis started. Pain control continues to be a challenge --> scheduled po Norco and reduced frequency IV Dilaudid, increased Lyrice to 150 mg tid - goal to wean off IV pain control in anticipation of discharge soon, now that she's off heparin.  10/15 dressing in place no complaints     AKI - resolved Prerenal D/c'ed ivf Monitor BMP   Avoid nephrotoxic meds   PAF (paroxysmal atrial fibrillation) (HCC) On Eliquis and metoprolol     Chronic diastolic CHF (congestive heart failure) (HCC) Clinically compensated Continue metoprolol and Imdur       DVT prophylaxis: eliquis Code Status:full Family Communication: None at bedside Disposition Plan:  Status is: Inpatient Remains inpatient appropriate because and safe discharge needs SNF     LOS: 15 days   Time spent: 35 min    Nolberto Hanlon, MD [facility] Pager [phone]  If 7PM-7AM, please contact night-coverage 08/06/2022, 3:03 PM

## 2022-08-07 DIAGNOSIS — I70222 Atherosclerosis of native arteries of extremities with rest pain, left leg: Secondary | ICD-10-CM | POA: Diagnosis not present

## 2022-08-07 LAB — GLUCOSE, CAPILLARY
Glucose-Capillary: 111 mg/dL — ABNORMAL HIGH (ref 70–99)
Glucose-Capillary: 130 mg/dL — ABNORMAL HIGH (ref 70–99)
Glucose-Capillary: 118 mg/dL — ABNORMAL HIGH (ref 70–99)
Glucose-Capillary: 155 mg/dL — ABNORMAL HIGH (ref 70–99)
Glucose-Capillary: 137 mg/dL — ABNORMAL HIGH (ref 70–99)
Glucose-Capillary: 134 mg/dL — ABNORMAL HIGH (ref 70–99)

## 2022-08-07 NOTE — Progress Notes (Signed)
Physical Therapy Treatment Patient Details Name: Carla [name] MRN: [id number] DOB: 18-Nov-1960 Today's Date: 08/07/2022   History of Present Illness Pt is a 61 year old female s/p OR initially for Left axillary to common femoral artery bypass graft, Prevena wound VAC placed .  Post surgery patient had left foot pain and mottled leg was taken to the OR again , now status post thrombectomy of left axillofemoral bypass graft after presentingto the ED with progressively worsening left leg pain; PMH significant for arterial disease s/p right AKA, chronic diastolic dysfunction CHF, anxiety and depression, COPD, paroxysmal A-fib on chronic anticoagulation therapy    PT Comments    Pt received in bed agreeable to PT/OT co-treat. Pt relies on modA+1 and HOB elevated to transfer to sitting with VC's for sequencing limbs towards EOB. Pt requiring VC's for upright positioning and placement of LLE on floor for balance.  Due to pt declining SNF placement, focus of session on slide board transfer to improve safety and decrease falls risk for transfers at home. Pt understanding of placement and needed min VC's for hand placement but otherwise able to understand safe concept of transfer attempting to scoot with minimal clearance of buttocks from bed <> recliner. Pt required modA+2 top transfer with sliding board this date due to safety for need to readjust board for pt under buttocks and bring recliner closer to pt as pt moving towards EOB increasing risk for sliding to floor. Pt left in care of OT with all needs in reach. Continue to heavily rec STR at discharge due to pt being below baseline mobility and still demonstrating significant assist for slide board transfer.   Recommendations for follow up therapy are one component of a multi-disciplinary discharge planning process, led by the attending physician.  Recommendations may be updated based on patient status, additional functional criteria and insurance  authorization.  Follow Up Recommendations  Skilled nursing-short term rehab (<3 hours/day) Can patient physically be transported by private vehicle: No   Assistance Recommended at Discharge Frequent or constant Supervision/Assistance  Patient can return home with the following Two people to help with walking and/or transfers;Two people to help with bathing/dressing/bathroom;Assistance with cooking/housework;Assist for transportation;Help with stairs or ramp for entrance   Equipment Recommendations   (slideboard if pt d/c's home)    Recommendations for Other Services       Precautions / Restrictions Precautions Precautions: Fall Restrictions Weight Bearing Restrictions: No     Mobility  Bed Mobility Overal bed mobility: Needs Assistance Bed Mobility: Supine to Sit     Supine to sit: Mod assist, HOB elevated     General bed mobility comments: VC's for sequencing LE's and modA on thorax to attain sitting. Patient Response: Cooperative  Transfers Overall transfer level: Needs assistance Equipment used: Sliding board Transfers: Bed to chair/wheelchair/BSC            Lateral/Scoot Transfers: From elevated surface, With slide board, Mod assist General transfer comment: Required modA for scooting into recliner. Correct sequencing of UE's and LLE with transfer. Just minimal hip motion along sliding board.    Ambulation/Gait               General Gait Details: Non-ambulatory at this time   Marine scientist Rankin (Stroke Patients Only)       Balance Overall balance assessment: Needs assistance Sitting-balance support: Bilateral upper extremity supported, Feet supported Sitting balance-[name]:  Fair Sitting balance - Comments: pt is able to maintain sitting balance with close supervision                                    Cognition Arousal/Alertness: Awake/alert Behavior During Therapy: WFL for  tasks assessed/performed, Anxious Overall Cognitive Status: Within Functional Limits for tasks assessed                                          Exercises      General Comments General comments (skin integrity, edema, etc.): Education on sliding board transfers to decrease falls risk due to fear and anxiety with STS with PT/OT      Pertinent Vitals/Pain Pain Assessment Pain Assessment: Faces Faces Pain Scale: Hurts little more Pain Location: L shoulder Pain Descriptors / Indicators: Crying, Discomfort, Moaning Pain Intervention(s): Limited activity within patient's tolerance, Monitored during session, Repositioned    Home Living                          Prior Function            PT Goals (current goals can now be found in the care plan section) Acute Rehab PT Goals Patient Stated Goal: go home when safe PT Goal Formulation: With patient Time For Goal Achievement: 08/07/22 Potential to Achieve Goals: Fair Progress towards PT goals: Progressing toward goals    Frequency    Min 2X/week      PT Plan Current plan remains appropriate    Co-evaluation PT/OT/SLP Co-Evaluation/Treatment: Yes Reason for Co-Treatment: For patient/therapist safety;To address functional/ADL transfers PT goals addressed during session: Mobility/safety with mobility;Balance;Proper use of DME;Strengthening/ROM        AM-PAC PT "6 Clicks" Mobility   Outcome Measure  Help needed turning from your back to your side while in a flat bed without using bedrails?: A Lot Help needed moving from lying on your back to sitting on the side of a flat bed without using bedrails?: A Lot Help needed moving to and from a bed to a chair (including a wheelchair)?: A Lot Help needed standing up from a chair using your arms (e.g., wheelchair or bedside chair)?: Total Help needed to walk in hospital room?: Total Help needed climbing 3-5 steps with a railing? : Total 6 Click Score:  9    End of Session Equipment Utilized During Treatment: Oxygen (slide board) Activity Tolerance: Patient limited by pain;Patient limited by fatigue Patient left: in chair;with call bell/phone within reach;with chair alarm set (in care of OT) Nurse Communication: Mobility status PT Visit Diagnosis: Other abnormalities of gait and mobility (R26.89);Muscle weakness (generalized) (M62.81);Pain Pain - Right/Left: Left Pain - part of body: Arm     Time: 1135-1155 PT Time Calculation (min) (ACUTE ONLY): 20 min  Charges:  $Therapeutic Activity: 8-22 mins                    Salem Caster. Fairly IV, PT, DPT Physical Therapist- [facility]  08/07/2022, 1:15 PM

## 2022-08-07 NOTE — Progress Notes (Signed)
Occupational Therapy Re-Evaluation Patient Details Name: Carla [name] MRN: [id number] DOB: [birth year] Today's Date: 08/07/2022   History of present illness Pt is a 61 year old female s/p OR initially for Left axillary to common femoral artery bypass graft, Prevena wound VAC placed .  Post surgery patient had left foot pain and mottled leg was taken to the OR again , now status post thrombectomy of left axillofemoral bypass graft after presentingto the ED with progressively worsening left leg pain; PMH significant for arterial disease s/p right AKA, chronic diastolic dysfunction CHF, anxiety and depression, COPD, paroxysmal A-fib on chronic anticoagulation therapy   OT comments  Ms Keenum was seen for OT/PT co-treatment on this date. Upon arrival to room pt reclined in bed, agreeable to tx. Pt requires MOD A lateral scoot bed>chair with slide board, ultimately +2 to complete safely. Pt completed seated exercises as described below - focus on anterior weight shifting as pt reports fear of falling. Goals updated to reflect progress, will continue to follow POC. Discharge recommendation remains appropriate.     Recommendations for follow up therapy are one component of a multi-disciplinary discharge planning process, led by the attending physician.  Recommendations may be updated based on patient status, additional functional criteria and insurance authorization.    Follow Up Recommendations  Skilled nursing-short term rehab (<3 hours/day)    Assistance Recommended at Discharge Frequent or constant Supervision/Assistance  Patient can return home with the following  Two people to help with walking and/or transfers;Two people to help with bathing/dressing/bathroom   Equipment Recommendations  BSC/3in1 (drop arm BSC)    Recommendations for Other Services      Precautions / Restrictions Precautions Precautions: Fall Restrictions Weight Bearing Restrictions: No       Mobility Bed  Mobility Overal bed mobility: Needs Assistance Bed Mobility: Supine to Sit     Supine to sit: Mod assist, HOB elevated          Transfers Overall transfer level: Needs assistance Equipment used: Sliding board Transfers: Bed to chair/wheelchair/BSC            Lateral/Scoot Transfers: From elevated surface, With slide board, Mod assist       Balance Overall balance assessment: Needs assistance Sitting-balance support: Bilateral upper extremity supported, Feet supported Sitting balance-[name]: Fair                                     ADL either performed or assessed with clinical judgement   ADL Overall ADL's : Needs assistance/impaired                                       General ADL Comments: MAX A don L sock. MOD A simulated BSC t/f lateral scoot.    Extremity/Trunk Assessment Upper Extremity Assessment Upper Extremity Assessment: Generalized weakness   Lower Extremity Assessment Lower Extremity Assessment: Generalized weakness         Cognition Arousal/Alertness: Awake/alert Behavior During Therapy: WFL for tasks assessed/performed, Anxious Overall Cognitive Status: Within Functional Limits for tasks assessed                                          Exercises Exercises: General Upper Extremity General Exercises - Upper  Extremity Chair Push Up: AROM, Strengthening, Both, 10 reps, Seated Other Exercises Other Exercises: chair sit ups x20 seated    Shoulder Instructions       General Comments Education on sliding board transfers to decrease falls risk due to fear and anxiety with STS with PT/OT    Pertinent Vitals/ Pain       Pain Assessment Pain Assessment: Faces Faces Pain Scale: Hurts little more Pain Location: L shoulder Pain Descriptors / Indicators: Crying, Discomfort, Moaning Pain Intervention(s): Limited activity within patient's tolerance, Repositioned   Frequency  Min 2X/week         Progress Toward Goals  OT Goals(current goals can now be found in the care plan section)  Progress towards OT goals: Progressing toward goals  Acute Rehab OT Goals Patient Stated Goal: return to PLOF OT Goal Formulation: With patient Time For Goal Achievement: 08/21/22 Potential to Achieve Goals: Good ADL Goals Pt Will Perform Grooming: with set-up;with supervision;sitting Pt Will Perform Upper Body Dressing: with min assist;sitting Pt Will Perform Lower Body Dressing: with min assist;sitting/lateral leans Pt Will Transfer to Toilet: with mod assist;bedside commode Pt Will Perform Toileting - Clothing Manipulation and hygiene: with min assist;sitting/lateral leans  Plan Discharge plan remains appropriate;Frequency remains appropriate    Co-evaluation    PT/OT/SLP Co-Evaluation/Treatment: Yes Reason for Co-Treatment: For patient/therapist safety;To address functional/ADL transfers PT goals addressed during session: Mobility/safety with mobility OT goals addressed during session: ADL's and self-care      AM-PAC OT "6 Clicks" Daily Activity     Outcome Measure   Help from another person eating meals?: A Little Help from another person taking care of personal grooming?: A Little Help from another person toileting, which includes using toliet, bedpan, or urinal?: A Lot Help from another person bathing (including washing, rinsing, drying)?: A Lot Help from another person to put on and taking off regular upper body clothing?: A Lot Help from another person to put on and taking off regular lower body clothing?: A Lot 6 Click Score: 14    End of Session    OT Visit Diagnosis: Muscle weakness (generalized) (M62.81);Unsteadiness on feet (R26.81)   Activity Tolerance Patient tolerated treatment well   Patient Left in chair;with call bell/phone within reach   Nurse Communication Mobility status        Time: 1610-9604 OT Time Calculation (min): 24 min  Charges:  OT General Charges $OT Visit: 1 Visit OT Evaluation $OT Re-eval: 1 Re-eval OT Treatments $Therapeutic Exercise: 8-22 mins  Dessie Coma, M.S. OTR/L  08/07/22, 2:14 PM  ascom [phone]

## 2022-08-07 NOTE — Plan of Care (Signed)
  Problem: Education: Goal: Knowledge of General Education information will improve Description: Including pain rating scale, medication(s)/side effects and non-pharmacologic comfort measures Outcome: Progressing   Problem: Health Behavior/Discharge Planning: Goal: Ability to manage health-related needs will improve Outcome: Progressing   Problem: Clinical Measurements: Goal: Ability to maintain clinical measurements within normal limits will improve Outcome: Progressing Goal: Will remain free from infection Outcome: Progressing Goal: Diagnostic test results will improve Outcome: Progressing Goal: Respiratory complications will improve Outcome: Progressing Goal: Cardiovascular complication will be avoided Outcome: Progressing   Problem: Activity: Goal: Risk for activity intolerance will decrease Outcome: Progressing   Problem: Nutrition: Goal: Adequate nutrition will be maintained Outcome: Progressing   Problem: Coping: Goal: Level of anxiety will decrease Outcome: Progressing   Problem: Elimination: Goal: Will not experience complications related to bowel motility Outcome: Progressing Goal: Will not experience complications related to urinary retention Outcome: Progressing   Problem: Pain Managment: Goal: General experience of comfort will improve Outcome: Progressing   Problem: Safety: Goal: Ability to remain free from injury will improve Outcome: Progressing   Problem: Skin Integrity: Goal: Risk for impaired skin integrity will decrease Outcome: Progressing   Problem: Education: Goal: Knowledge of prescribed regimen will improve Outcome: Progressing   Problem: Activity: Goal: Ability to tolerate increased activity will improve Outcome: Progressing   Problem: Bowel/Gastric: Goal: Gastrointestinal status for postoperative course will improve Outcome: Progressing   Problem: Clinical Measurements: Goal: Postoperative complications will be avoided or  minimized Outcome: Progressing Goal: Signs and symptoms of graft occlusion will improve Outcome: Progressing   Problem: Skin Integrity: Goal: Demonstration of wound healing without infection will improve Outcome: Progressing   Problem: Education: Goal: Ability to describe self-care measures that may prevent or decrease complications (Diabetes Survival Skills Education) will improve Outcome: Progressing Goal: Individualized Educational Video(s) Outcome: Progressing   Problem: Coping: Goal: Ability to adjust to condition or change in health will improve Outcome: Progressing   Problem: Fluid Volume: Goal: Ability to maintain a balanced intake and output will improve Outcome: Progressing   Problem: Health Behavior/Discharge Planning: Goal: Ability to identify and utilize available resources and services will improve Outcome: Progressing Goal: Ability to manage health-related needs will improve Outcome: Progressing   Problem: Metabolic: Goal: Ability to maintain appropriate glucose levels will improve Outcome: Progressing   Problem: Nutritional: Goal: Maintenance of adequate nutrition will improve Outcome: Progressing Goal: Progress toward achieving an optimal weight will improve Outcome: Progressing   Problem: Skin Integrity: Goal: Risk for impaired skin integrity will decrease Outcome: Progressing   Problem: Tissue Perfusion: Goal: Adequacy of tissue perfusion will improve Outcome: Progressing

## 2022-08-07 NOTE — Plan of Care (Signed)

## 2022-08-07 NOTE — Care Management Important Message (Signed)
Important Message  Patient Details  Name: Carla [name] MRN: [id number] Date of Birth: 06/16/1961   Medicare Important Message Given:  Yes     Juliann Pulse A Sani Madariaga 08/07/2022, 12:21 PM

## 2022-08-07 NOTE — Progress Notes (Signed)
PROGRESS NOTE    Carla [name]  VOZ:[id number] DOB: 12-25-1960 DOA: 07/22/2022 PCP: [facility]    Brief Narrative:  Carla [name] is a 62 y.o. female with medical history significant for peripheral arterial disease s/p right AKA, chronic diastolic dysfunction CHF, anxiety and depression, COPD, paroxysmal A-fib on chronic anticoagulation therapy, who presents to the ED with left leg pain for the past 1 week which had been progressively worsening.  09/30: CTA found with ischemic LLE. The patient was evaluated by vascular surgeon, Dr. Trula Slade in the ED who will take her to the OR on Sunday 10/1.  Patient started on heparin. Admitted to hospitalist service.  10/01: pt went to OR for common femoral artery bypass graft, post OR woke up c/o leg pain, mottled leg, went back to OR for thrombectomy of Left axillary femoral bypass graft with revision. Transferred to ICU.  10/02: Started on cefepime for concern for abscess of right BKA stump 10/03-10/04: Left upper extremity swelling noted overnight, ultrasound pending as of this AM --> no DVT but (+)hematoma. D/w vascular - no procedures planned for this (expected w/ anticoagulation) and ok to transfer to floor. Pain control has been a challenge but she is better w/ anxiolytics.  10/05: Afib w/ occasional rapid rate and hypotension.  Hemoglobin down to 6.2.  Transfused 2 units PRBC, VS stabilized.  10/06: Hemoglobin 9.0 posttransfusion, 7.5 this morning.  Follow H/H midday today Hgb 7.7, hold off on transfusion for now, CBC in AM. PT recommending SNF. 10/07: concern for thrombosis to toes, vascular following w/ pharmacy for heparin management. Transfusing another unit blood for Hgb 7.0  10/08: continues to c/o pain LUE and RLE. Will image RLE --> CT (+) Low-density fluid collection along the distal aspect of the right femoral amputation. This is nonspecific and could be sterile or infected. No gas or  foreign body is seen associated with this  collection. No evidence of osteomyelitis or hip joint effusion. 10/09: Remains on heparin per vascular. Pain control still a challenge 10/11 IV Dilaudid discontinued.  Work with PT and sitting in chair today. 10/12 no overnight issues 10/15 no new issues 10/16 no issues    Consultants:  Vascular Surgery    Procedures: 10/01: Left axillary to common femoral artery bypass graft to treat ischemia 10/01: Thrombectomy of left axillary femoral bypass graft with revision   Antimicrobials:  Cefazolin x3 doses  Cefepime 10/2 - 10/9       Subjective: No complaints. Sleeping comfortably. No sob    Objective: Vitals:   08/06/22 1552 08/06/22 1813 08/06/22 2341 08/07/22 0726  BP: (!) 121/56 123/62 (!) 108/55 (!) 120/51  Pulse: 87 84 90 73  Resp: '16 18 18 16  '$ Temp: 98.6 F (37 C) 98.3 F (36.8 C) 98.8 F (37.1 C) 98.1 F (36.7 C)  TempSrc:      SpO2: 99% 97% 99% 99%  Weight:      Height:        Intake/Output Summary (Last 24 hours) at 08/07/2022 4259 Last data filed at 08/07/2022 0400 Gross per 24 hour  Intake 960 ml  Output 2051 ml  Net -1091 ml   Filed Weights   07/22/22 0029  Weight: 80.7 kg    Examination: Calm, NAD Cta no w/r Reg s1/s2 no gallop Soft benign +bs Rt aka , LLE no edema Aaoxox3  Mood and affect appropriate in current setting     Data Reviewed: I have personally reviewed following labs and imaging studies  CBC: Recent Labs  Lab 08/01/22 0559 08/02/22 0424 08/04/22 0547  WBC 9.5 7.3  --   HGB 7.6* 7.4* 8.0*  HCT 24.8* 24.0* 26.1*  MCV 91.9 93.0  --   PLT 232 235  --    Basic Metabolic Panel: Recent Labs  Lab 08/01/22 0559 08/02/22 0424  NA 139 141  K 4.0 3.9  CL 103 105  CO2 30 29  GLUCOSE 117* 124*  BUN 18 16  CREATININE 0.50 0.49  CALCIUM 8.3* 8.4*   GFR: Estimated Creatinine Clearance: 71.7 mL/min (by C-G formula based on SCr of 0.49 mg/dL). Liver Function Tests: No results for input(s): "AST", "ALT", "ALKPHOS",  "BILITOT", "PROT", "ALBUMIN" in the last 168 hours. No results for input(s): "LIPASE", "AMYLASE" in the last 168 hours. No results for input(s): "AMMONIA" in the last 168 hours. Coagulation Profile: No results for input(s): "INR", "PROTIME" in the last 168 hours. Cardiac Enzymes: No results for input(s): "CKTOTAL", "CKMB", "CKMBINDEX", "TROPONINI" in the last 168 hours. BNP (last 3 results) No results for input(s): "PROBNP" in the last 8760 hours. HbA1C: No results for input(s): "HGBA1C" in the last 72 hours. CBG: Recent Labs  Lab 08/06/22 1549 08/06/22 1938 08/06/22 2339 08/07/22 0409 08/07/22 0747  GLUCAP 120* 145* 158* 111* 130*   Lipid Profile: No results for input(s): "CHOL", "HDL", "LDLCALC", "TRIG", "CHOLHDL", "LDLDIRECT" in the last 72 hours. Thyroid Function Tests: No results for input(s): "TSH", "T4TOTAL", "FREET4", "T3FREE", "THYROIDAB" in the last 72 hours. Anemia Panel: No results for input(s): "VITAMINB12", "FOLATE", "FERRITIN", "TIBC", "IRON", "RETICCTPCT" in the last 72 hours. Sepsis Labs: No results for input(s): "PROCALCITON", "LATICACIDVEN" in the last 168 hours.  No results found for this or any previous visit (from the past 240 hour(s)).       Radiology Studies: No results found.      Scheduled Meds:  sodium chloride   Intravenous Once   apixaban  10 mg Oral BID   Followed by   Derrill Memo ON 08/08/2022] apixaban  5 mg Oral BID   aspirin EC  81 mg Oral Q0600   atorvastatin  80 mg Oral QHS   Chlorhexidine Gluconate Cloth  6 each Topical Daily   citalopram  40 mg Oral Daily   docusate sodium  100 mg Oral Daily   insulin aspart  0-15 Units Subcutaneous Q4H   isosorbide mononitrate  60 mg Oral Daily   lidocaine  1 patch Transdermal Q24H   pantoprazole  40 mg Oral Daily   pregabalin  150 mg Oral TID   Continuous Infusions:  sodium chloride     magnesium sulfate bolus IVPB      Assessment & Plan:   Principal Problem:   Critical limb  ischemia of left lower extremity (HCC) Active Problems:   AKI (acute kidney injury) ([place])   PAF (paroxysmal atrial fibrillation) (HCC)   Chronic diastolic CHF (congestive heart failure) (HCC)   Abscess of knee, right   Hematoma   Critical limb ischemia of left lower extremity ([place]) Peripheral artery disease s/p right BKA S/p OR initially 10/01 for Left axillary to common femoral artery bypass graft, Prevena wound VAC placed .  Post surgery patient had left foot pain and mottled leg was taken to the OR again 10/01, thrombectomy of left axillofemoral bypass graft Continue on heparin per vascular surgery, home eliquis held (Hx Afib) Continuing aspirin, statin 10/12 Lt foot warm and well perfused.  Pain control Needs to work with therapy 10/15 no other vascular recommendations  Left upper extremity swelling Hematoma LUE Noted overnight 10/03-10/04 [place] LUE no DVT but there is large (6.6 x 6.3 x 6.5 cm (volume = 140 cm^3)) hematoma under the left axillary surgical site. D/w vascular - no procedures planned for this (expected w/ anticoagulation) and ok to transfer to floor 10/11 give blood work break to avoid more drop. 10/16 H&H stable    Acute blood loss anemia, surgical intervention, hematoma Status posttransfusion 2 units PRBC 07/27/2022, 1 unit PRBC 07/29/22  10/16 H&H stable    Rt knee fluid collection concerning for abscess At site of amputation, found on CT concern for abscess vs non-infectious fluid collection Cefpime completed, has not helped - favor non-infectious process  Vascular following, avoiding procedures Continues to c/o pain despite course of abx, 10/08 will get XR preliminary but expect will need CT --> XR ok 10/09: reviewed CT RLE (+) Low-density fluid collection along the distal aspect of the right femoral amputation. This is nonspecific and could be sterile or infected. No gas or  foreign body is seen associated with this collection. No evidence of  osteomyelitis or hip joint effusion. 10/10: d/c heparin gtt per vascular, ok to transition to oral AC. Eliquis started. Pain control continues to be a challenge --> scheduled po Norco and reduced frequency IV Dilaudid, increased Lyrice to 150 mg tid - goal to wean off IV pain control in anticipation of discharge soon, now that she's off heparin.  10/16 dressing in place no complaints.    AKI - resolved Prerenal D/c'ed ivf Monitor BMP   Avoid nephrotoxic meds   PAF (paroxysmal atrial fibrillation) (HCC) On Eliquis and metoprolol     Chronic diastolic CHF (congestive heart failure) (HCC) Clinically compensated Continue metoprolol and Imdur       DVT prophylaxis: eliquis Code Status:full Family Communication: None at bedside Disposition Plan:  Status is: Inpatient Remains inpatient appropriate because and safe discharge needs SNF     LOS: 16 days   Time spent: 35 min    Nolberto Hanlon, MD [facility] Pager [phone]  If 7PM-7AM, please contact night-coverage 08/07/2022, 8:52 AM

## 2022-08-07 NOTE — Progress Notes (Signed)
Attempt to reach patient's daughter to discuss discharge plan and therapy recommendation. No answer. Left a message.

## 2022-08-08 DIAGNOSIS — I70222 Atherosclerosis of native arteries of extremities with rest pain, left leg: Secondary | ICD-10-CM | POA: Diagnosis not present

## 2022-08-08 LAB — GLUCOSE, CAPILLARY
Glucose-Capillary: 101 mg/dL — ABNORMAL HIGH (ref 70–99)
Glucose-Capillary: 103 mg/dL — ABNORMAL HIGH (ref 70–99)
Glucose-Capillary: 113 mg/dL — ABNORMAL HIGH (ref 70–99)
Glucose-Capillary: 133 mg/dL — ABNORMAL HIGH (ref 70–99)
Glucose-Capillary: 138 mg/dL — ABNORMAL HIGH (ref 70–99)
Glucose-Capillary: 151 mg/dL — ABNORMAL HIGH (ref 70–99)

## 2022-08-08 MED ORDER — METOPROLOL TARTRATE 25 MG PO TABS
12.5000 mg | ORAL_TABLET | Freq: Two times a day (BID) | ORAL | Status: DC
  Administered 2022-08-08 – 2022-08-11 (×6): 12.5 mg via ORAL
  Filled 2022-08-08 (×11): qty 1

## 2022-08-08 NOTE — Progress Notes (Signed)
PROGRESS NOTE    Carla [name]  BDZ:[id number] DOB: Feb 07, 1961 DOA: 07/22/2022 PCP: [facility] [place]    Brief Narrative:  Carla [name] is a 61 y.o. female with medical history significant for peripheral arterial disease s/p right AKA, chronic diastolic dysfunction CHF, anxiety and depression, COPD, paroxysmal A-fib on chronic anticoagulation therapy, who presents to the ED with left leg pain for the past 1 week which had been progressively worsening.  09/30: CTA found with ischemic LLE. The patient was evaluated by vascular surgeon, Dr. Trula Slade in the ED who will take her to the OR on Sunday 10/1.  Patient started on heparin. Admitted to hospitalist service.  10/01: pt went to OR for common femoral artery bypass graft, post OR woke up c/o leg pain, mottled leg, went back to OR for thrombectomy of Left axillary femoral bypass graft with revision. Transferred to ICU.  10/02: Started on cefepime for concern for abscess of right BKA stump 10/03-10/04: Left upper extremity swelling noted overnight, ultrasound pending as of this AM --> no DVT but (+)hematoma. D/w vascular - no procedures planned for this (expected w/ anticoagulation) and ok to transfer to floor. Pain control has been a challenge but she is better w/ anxiolytics.  10/05: Afib w/ occasional rapid rate and hypotension.  Hemoglobin down to 6.2.  Transfused 2 units PRBC, VS stabilized.  10/06: Hemoglobin 9.0 posttransfusion, 7.5 this morning.  Follow H/H midday today Hgb 7.7, hold off on transfusion for now, CBC in AM. PT recommending SNF. 10/07: concern for thrombosis to toes, vascular following w/ pharmacy for heparin management. Transfusing another unit blood for Hgb 7.0  10/08: continues to c/o pain LUE and RLE. Will image RLE --> CT (+) Low-density fluid collection along the distal aspect of the right femoral amputation. This is nonspecific and could be sterile or infected. No gas or  foreign body is seen associated with this  collection. No evidence of osteomyelitis or hip joint effusion. 10/09: Remains on heparin per vascular. Pain control still a challenge 10/11 IV Dilaudid discontinued.  Work with PT and sitting in chair today. 10/12 no overnight issues 10/15 no new issues 10/16 no issues 10/17 snf pending    Consultants:  Vascular Surgery    Procedures: 10/01: Left axillary to common femoral artery bypass graft to treat ischemia 10/01: Thrombectomy of left axillary femoral bypass graft with revision   Antimicrobials:  Cefazolin x3 doses  Cefepime 10/2 - 10/9       Subjective: No new complaints. No sob, or cp, or leg pain    Objective: Vitals:   08/07/22 0726 08/07/22 2034 08/08/22 0404 08/08/22 0748  BP: (!) 120/51 (!) 107/53 (!) 109/59 121/74  Pulse: 73 83 87 85  Resp: '16 16 17 16  '$ Temp: 98.1 F (36.7 C) 99.8 F (37.7 C) 98.2 F (36.8 C) 98.1 F (36.7 C)  TempSrc:      SpO2: 99% 99% 94% 96%  Weight:      Height:        Intake/Output Summary (Last 24 hours) at 08/08/2022 0803 Last data filed at 08/08/2022 0753 Gross per 24 hour  Intake --  Output 1900 ml  Net -1900 ml   Filed Weights   07/22/22 0029  Weight: 80.7 kg    Examination: Calm, NAD Cta no w/r Reg s1/s2 no gallop Soft benign +bs Rt AKA, Lt no edema Awake and alert Mood and affect appropriate in current setting     Data Reviewed: I have personally  reviewed following labs and imaging studies  CBC: Recent Labs  Lab 08/02/22 0424 08/04/22 0547  WBC 7.3  --   HGB 7.4* 8.0*  HCT 24.0* 26.1*  MCV 93.0  --   PLT 235  --    Basic Metabolic Panel: Recent Labs  Lab 08/02/22 0424  NA 141  K 3.9  CL 105  CO2 29  GLUCOSE 124*  BUN 16  CREATININE 0.49  CALCIUM 8.4*   GFR: Estimated Creatinine Clearance: 71.7 mL/min (by C-G formula based on SCr of 0.49 mg/dL). Liver Function Tests: No results for input(s): "AST", "ALT", "ALKPHOS", "BILITOT", "PROT", "ALBUMIN" in the last 168 hours. No results  for input(s): "LIPASE", "AMYLASE" in the last 168 hours. No results for input(s): "AMMONIA" in the last 168 hours. Coagulation Profile: No results for input(s): "INR", "PROTIME" in the last 168 hours. Cardiac Enzymes: No results for input(s): "CKTOTAL", "CKMB", "CKMBINDEX", "TROPONINI" in the last 168 hours. BNP (last 3 results) No results for input(s): "PROBNP" in the last 8760 hours. HbA1C: No results for input(s): "HGBA1C" in the last 72 hours. CBG: Recent Labs  Lab 08/07/22 2015 08/07/22 2233 08/08/22 0001 08/08/22 0401 08/08/22 0752  GLUCAP 137* 134* 138* 103* 101*   Lipid Profile: No results for input(s): "CHOL", "HDL", "LDLCALC", "TRIG", "CHOLHDL", "LDLDIRECT" in the last 72 hours. Thyroid Function Tests: No results for input(s): "TSH", "T4TOTAL", "FREET4", "T3FREE", "THYROIDAB" in the last 72 hours. Anemia Panel: No results for input(s): "VITAMINB12", "FOLATE", "FERRITIN", "TIBC", "IRON", "RETICCTPCT" in the last 72 hours. Sepsis Labs: No results for input(s): "PROCALCITON", "LATICACIDVEN" in the last 168 hours.  No results found for this or any previous visit (from the past 240 hour(s)).       Radiology Studies: No results found.      Scheduled Meds:  sodium chloride   Intravenous Once   apixaban  10 mg Oral BID   Followed by   apixaban  5 mg Oral BID   aspirin EC  81 mg Oral Q0600   atorvastatin  80 mg Oral QHS   Chlorhexidine Gluconate Cloth  6 each Topical Daily   citalopram  40 mg Oral Daily   docusate sodium  100 mg Oral Daily   insulin aspart  0-15 Units Subcutaneous Q4H   isosorbide mononitrate  60 mg Oral Daily   lidocaine  1 patch Transdermal Q24H   pantoprazole  40 mg Oral Daily   pregabalin  150 mg Oral TID   Continuous Infusions:  sodium chloride     magnesium sulfate bolus IVPB      Assessment & Plan:   Principal Problem:   Critical limb ischemia of left lower extremity (HCC) Active Problems:   AKI (acute kidney injury) ([place])    PAF (paroxysmal atrial fibrillation) (HCC)   Chronic diastolic CHF (congestive heart failure) (HCC)   Abscess of knee, right   Hematoma   Critical limb ischemia of left lower extremity (HCC) Peripheral artery disease s/p right BKA S/p OR initially 10/01 for Left axillary to common femoral artery bypass graft, Prevena wound VAC placed .  Post surgery patient had left foot pain and mottled leg was taken to the OR again 10/01, thrombectomy of left axillofemoral bypass graft Continue on heparin per vascular surgery, home eliquis held (Hx Afib) Continuing aspirin, statin       Pain control Needs to work with therapy 10/17 iv pain med was d/c'd No other vascular recommendations        Left upper extremity swelling Hematoma  LUE Noted overnight 10/03-10/04 [place] LUE no DVT but there is large (6.6 x 6.3 x 6.5 cm (volume = 140 cm^3)) hematoma under the left axillary surgical site. D/w vascular - no procedures planned for this (expected w/ anticoagulation) and ok to transfer to floor 10/11 give blood work break to avoid more drop. 10/17 h/h stable. Ck periodically    Acute blood loss anemia, surgical intervention, hematoma Status posttransfusion 2 units PRBC 07/27/2022, 1 unit PRBC 07/29/22  10/17 H&H was stable.  Check periodically    Rt knee fluid collection concerning for abscess At site of amputation, found on CT concern for abscess vs non-infectious fluid collection Cefpime completed, has not helped - favor non-infectious process  Vascular following, avoiding procedures Continues to c/o pain despite course of abx, 10/08 will get XR preliminary but expect will need CT --> XR ok 10/09: reviewed CT RLE (+) Low-density fluid collection along the distal aspect of the right femoral amputation. This is nonspecific and could be sterile or infected. No gas or  foreign body is seen associated with this collection. No evidence of osteomyelitis or hip joint effusion. 10/10: d/c heparin gtt per  vascular, ok to transition to oral AC. Eliquis started. Pain control continues to be a challenge --> scheduled po Norco and reduced frequency IV Dilaudid, increased Lyrice to 150 mg tid - goal to wean off IV pain control in anticipation of discharge soon, now that she's off heparin.  10/17 dressing in place no complaints      AKI - resolved Prerenal D/c'ed ivf Ck am labs since its been a week Avoid nephrotoxic meds   PAF (paroxysmal atrial fibrillation) (HCC) On Eliquis and metoprolol Start po metoprolol     Chronic diastolic CHF (congestive heart failure) (HCC) Clinically compensated Continue metoprolol and Imdur       DVT prophylaxis: eliquis Code Status:full Family Communication: None at bedside Disposition Plan:  Status is: Inpatient Remains inpatient appropriate because and safe discharge needs SNF     LOS: 17 days   Time spent: 35 min    Nolberto Hanlon, MD [facility] Pager [phone]  If 7PM-7AM, please contact night-coverage 08/08/2022, 8:03 AM

## 2022-08-08 NOTE — Progress Notes (Signed)
Spoke with patient's daughter, [name] about therapy recommendation. She will talk with patient this evening about a SNF. [name] stated she works and will not be able to assit patient in the home until she gets off. [name] inquired about if stay would be for LTC. Advised stay would be short term.

## 2022-08-08 NOTE — Plan of Care (Signed)
  Problem: Education: Goal: Knowledge of General Education information will improve Description: Including pain rating scale, medication(s)/side effects and non-pharmacologic comfort measures Outcome: Progressing   Problem: Health Behavior/Discharge Planning: Goal: Ability to manage health-related needs will improve Outcome: Progressing   Problem: Clinical Measurements: Goal: Ability to maintain clinical measurements within normal limits will improve Outcome: Progressing Goal: Will remain free from infection Outcome: Progressing Goal: Diagnostic test results will improve Outcome: Progressing Goal: Respiratory complications will improve Outcome: Progressing Goal: Cardiovascular complication will be avoided Outcome: Progressing   Problem: Activity: Goal: Risk for activity intolerance will decrease Outcome: Progressing   Problem: Nutrition: Goal: Adequate nutrition will be maintained Outcome: Progressing   Problem: Coping: Goal: Level of anxiety will decrease Outcome: Progressing   Problem: Elimination: Goal: Will not experience complications related to bowel motility Outcome: Progressing Goal: Will not experience complications related to urinary retention Outcome: Progressing   Problem: Pain Managment: Goal: General experience of comfort will improve Outcome: Progressing   Problem: Safety: Goal: Ability to remain free from injury will improve Outcome: Progressing   Problem: Skin Integrity: Goal: Risk for impaired skin integrity will decrease Outcome: Progressing   Problem: Education: Goal: Knowledge of prescribed regimen will improve Outcome: Progressing   Problem: Activity: Goal: Ability to tolerate increased activity will improve Outcome: Progressing   Problem: Bowel/Gastric: Goal: Gastrointestinal status for postoperative course will improve Outcome: Progressing   Problem: Clinical Measurements: Goal: Postoperative complications will be avoided or  minimized Outcome: Progressing Goal: Signs and symptoms of graft occlusion will improve Outcome: Progressing   Problem: Skin Integrity: Goal: Demonstration of wound healing without infection will improve Outcome: Progressing   Problem: Education: Goal: Ability to describe self-care measures that may prevent or decrease complications (Diabetes Survival Skills Education) will improve Outcome: Progressing Goal: Individualized Educational Video(s) Outcome: Progressing   Problem: Coping: Goal: Ability to adjust to condition or change in health will improve Outcome: Progressing   Problem: Fluid Volume: Goal: Ability to maintain a balanced intake and output will improve Outcome: Progressing   Problem: Health Behavior/Discharge Planning: Goal: Ability to identify and utilize available resources and services will improve Outcome: Progressing Goal: Ability to manage health-related needs will improve Outcome: Progressing   Problem: Metabolic: Goal: Ability to maintain appropriate glucose levels will improve Outcome: Progressing   Problem: Nutritional: Goal: Maintenance of adequate nutrition will improve Outcome: Progressing Goal: Progress toward achieving an optimal weight will improve Outcome: Progressing   Problem: Skin Integrity: Goal: Risk for impaired skin integrity will decrease Outcome: Progressing   Problem: Tissue Perfusion: Goal: Adequacy of tissue perfusion will improve Outcome: Progressing

## 2022-08-09 DIAGNOSIS — D62 Acute posthemorrhagic anemia: Secondary | ICD-10-CM

## 2022-08-09 DIAGNOSIS — I70222 Atherosclerosis of native arteries of extremities with rest pain, left leg: Secondary | ICD-10-CM | POA: Diagnosis not present

## 2022-08-09 LAB — BASIC METABOLIC PANEL
Anion gap: 9 (ref 5–15)
BUN: 16 mg/dL (ref 8–23)
CO2: 28 mmol/L (ref 22–32)
Calcium: 8.5 mg/dL — ABNORMAL LOW (ref 8.9–10.3)
Chloride: 103 mmol/L (ref 98–111)
Creatinine, Ser: 0.56 mg/dL (ref 0.44–1.00)
GFR, Estimated: >60 mL/min (ref >60)
Glucose, Bld: 147 mg/dL — ABNORMAL HIGH (ref 70–99)
Potassium: 4.1 mmol/L (ref 3.5–5.1)
Sodium: 140 mmol/L (ref 135–145)

## 2022-08-09 LAB — GLUCOSE, CAPILLARY
Glucose-Capillary: 101 mg/dL — ABNORMAL HIGH (ref 70–99)
Glucose-Capillary: 112 mg/dL — ABNORMAL HIGH (ref 70–99)
Glucose-Capillary: 114 mg/dL — ABNORMAL HIGH (ref 70–99)
Glucose-Capillary: 123 mg/dL — ABNORMAL HIGH (ref 70–99)
Glucose-Capillary: 125 mg/dL — ABNORMAL HIGH (ref 70–99)
Glucose-Capillary: 126 mg/dL — ABNORMAL HIGH (ref 70–99)
Glucose-Capillary: 156 mg/dL — ABNORMAL HIGH (ref 70–99)

## 2022-08-09 LAB — CBC
HCT: 26.9 % — ABNORMAL LOW (ref 36.0–46.0)
Hemoglobin: 8.1 g/dL — ABNORMAL LOW (ref 12.0–15.0)
MCH: 28.9 pg (ref 26.0–34.0)
MCHC: 30.1 g/dL (ref 30.0–36.0)
MCV: 96.1 fL (ref 80.0–100.0)
Platelets: 276 10*3/uL (ref 150–400)
RBC: 2.8 MIL/uL — ABNORMAL LOW (ref 3.87–5.11)
RDW: 18.9 % — ABNORMAL HIGH (ref 11.5–15.5)
WBC: 6.3 10*3/uL (ref 4.0–10.5)
nRBC: 0 % (ref 0.0–0.2)

## 2022-08-09 MED ORDER — TRAMADOL HCL 50 MG PO TABS
50.0000 mg | ORAL_TABLET | Freq: Once | ORAL | Status: AC
  Administered 2022-08-09: 50 mg via ORAL
  Filled 2022-08-09: qty 1

## 2022-08-09 MED ORDER — POLYETHYLENE GLYCOL 3350 17 G PO PACK: 17.0000 g | PACK | Freq: Every day | ORAL | Status: DC

## 2022-08-09 MED ORDER — MORPHINE SULFATE ER 15 MG PO TBCR
15.0000 mg | EXTENDED_RELEASE_TABLET | Freq: Two times a day (BID) | ORAL | Status: DC
  Administered 2022-08-09 – 2022-08-12 (×6): 15 mg via ORAL
  Filled 2022-08-09 (×7): qty 1

## 2022-08-09 MED ORDER — POLYETHYLENE GLYCOL 3350 17 G PO PACK
17.0000 g | PACK | Freq: Every day | ORAL | Status: DC
  Administered 2022-08-09 – 2022-08-12 (×4): 17 g via ORAL
  Filled 2022-08-09 (×4): qty 1

## 2022-08-09 NOTE — Progress Notes (Signed)
Occupational Therapy Treatment Patient Details Name: Carla [name] MRN: [id number] DOB: 1961-10-01 Today's Date: 08/09/2022   History of present illness Pt is a 61 year old female s/p OR initially for Left axillary to common femoral artery bypass graft, Prevena wound VAC placed .  Post surgery patient had left foot pain and mottled leg was taken to the OR again , now status post thrombectomy of left axillofemoral bypass graft after presentingto the ED with progressively worsening left leg pain; PMH significant for arterial disease s/p right AKA, chronic diastolic dysfunction CHF, anxiety and depression, COPD, paroxysmal A-fib on chronic anticoagulation therapy   OT comments  Carla [name] was seen for OT/PT co-treatment on this date. Upon arrival to room pt reclined in bed, agreeable to tx. Pt requires MIN A bed mobility. MAX A don L sock. Attempted stands x2 at EOB requires MOD A + RW however poor tolerance. MAX A x2 for SPT bed<>BSC. Fair static sitting balance on BSC, session limited by phantom limb pain however greatly improved participation and insight. Pt making good progress toward goals, will continue to follow POC. Discharge recommendation remains appropriate.     Recommendations for follow up therapy are one component of a multi-disciplinary discharge planning process, led by the attending physician.  Recommendations may be updated based on patient status, additional functional criteria and insurance authorization.    Follow Up Recommendations  Skilled nursing-short term rehab (<3 hours/day)    Assistance Recommended at Discharge Frequent or constant Supervision/Assistance  Patient can return home with the following  Two people to help with walking and/or transfers;Two people to help with bathing/dressing/bathroom   Equipment Recommendations  BSC/3in1    Recommendations for Other Services      Precautions / Restrictions Precautions Precautions: Fall Precaution Comments: dressing  changed on distal R stump 10/13 Restrictions Weight Bearing Restrictions: No       Mobility Bed Mobility Overal bed mobility: Needs Assistance Bed Mobility: Supine to Sit, Sit to Supine     Supine to sit: Mod assist, HOB elevated Sit to supine: Min assist        Transfers Overall transfer level: Needs assistance Equipment used: 2 person hand held assist Transfers: Bed to chair/wheelchair/BSC, Sit to/from Stand Sit to Stand: Mod assist, +2 physical assistance, From elevated surface Stand pivot transfers: Max assist, +2 physical assistance               Balance Overall balance assessment: Needs assistance Sitting-balance support: Bilateral upper extremity supported, Feet supported Sitting balance-[name]: Fair     Standing balance support: Bilateral upper extremity supported Standing balance-[name]: Zero                             ADL either performed or assessed with clinical judgement   ADL Overall ADL's : Needs assistance/impaired                                       General ADL Comments: MAX A don L sock. MAX A x2 for SPT bed<>BSC. Fair static sitting balance on BSC, limited by phantom limb pain      Cognition Arousal/Alertness: Awake/alert Behavior During Therapy: WFL for tasks assessed/performed, Anxious Overall Cognitive Status: Within Functional Limits for tasks assessed Area of Impairment: Safety/judgement  Safety/Judgement: Decreased awareness of safety, Decreased awareness of deficits               Pertinent Vitals/ Pain       Pain Assessment Pain Assessment: Faces Faces Pain Scale: Hurts little more Pain Location: RLE phantom limb pain Pain Descriptors / Indicators: Discomfort, Grimacing Pain Intervention(s): Limited activity within patient's tolerance, Repositioned   Frequency  Min 2X/week        Progress Toward Goals  OT Goals(current goals can now be  found in the care plan section)  Progress towards OT goals: Progressing toward goals  Acute Rehab OT Goals Patient Stated Goal: to go home OT Goal Formulation: With patient Time For Goal Achievement: 08/21/22 Potential to Achieve Goals: Good ADL Goals Pt Will Perform Grooming: with set-up;with supervision;sitting Pt Will Perform Upper Body Dressing: with min assist;sitting Pt Will Perform Lower Body Dressing: with min assist;sitting/lateral leans Pt Will Transfer to Toilet: with mod assist;bedside commode Pt Will Perform Toileting - Clothing Manipulation and hygiene: with min assist;sitting/lateral leans  Plan Discharge plan remains appropriate;Frequency remains appropriate    Co-evaluation    PT/OT/SLP Co-Evaluation/Treatment: Yes Reason for Co-Treatment: For patient/therapist safety;To address functional/ADL transfers PT goals addressed during session: Mobility/safety with mobility OT goals addressed during session: ADL's and self-care      AM-PAC OT "6 Clicks" Daily Activity     Outcome Measure   Help from another person eating meals?: A Little Help from another person taking care of personal grooming?: A Little Help from another person toileting, which includes using toliet, bedpan, or urinal?: A Lot Help from another person bathing (including washing, rinsing, drying)?: A Lot Help from another person to put on and taking off regular upper body clothing?: A Lot Help from another person to put on and taking off regular lower body clothing?: A Lot 6 Click Score: 14    End of Session Equipment Utilized During Treatment: Gait belt  OT Visit Diagnosis: Muscle weakness (generalized) (M62.81);Unsteadiness on feet (R26.81)   Activity Tolerance Patient tolerated treatment well   Patient Left in bed;with call bell/phone within reach;with bed alarm set   Nurse Communication Mobility status        Time: 5638-9373 OT Time Calculation (min): 28 min  Charges: OT General  Charges $OT Visit: 1 Visit OT Treatments $Self Care/Home Management : 8-22 mins  Dessie Coma, M.S. OTR/L  08/09/22, 12:53 PM  ascom [phone]

## 2022-08-09 NOTE — Plan of Care (Signed)
  Problem: Education: Goal: Knowledge of General Education information will improve Description: Including pain rating scale, medication(s)/side effects and non-pharmacologic comfort measures Outcome: Progressing   Problem: Health Behavior/Discharge Planning: Goal: Ability to manage health-related needs will improve Outcome: Progressing   Problem: Clinical Measurements: Goal: Ability to maintain clinical measurements within normal limits will improve Outcome: Progressing Goal: Will remain free from infection Outcome: Progressing Goal: Diagnostic test results will improve Outcome: Progressing Goal: Respiratory complications will improve Outcome: Progressing Goal: Cardiovascular complication will be avoided Outcome: Progressing   Problem: Activity: Goal: Risk for activity intolerance will decrease Outcome: Progressing   Problem: Nutrition: Goal: Adequate nutrition will be maintained Outcome: Progressing   Problem: Coping: Goal: Level of anxiety will decrease Outcome: Progressing   Problem: Elimination: Goal: Will not experience complications related to bowel motility Outcome: Progressing Goal: Will not experience complications related to urinary retention Outcome: Progressing   Problem: Pain Managment: Goal: General experience of comfort will improve Outcome: Progressing   Problem: Safety: Goal: Ability to remain free from injury will improve Outcome: Progressing   Problem: Skin Integrity: Goal: Risk for impaired skin integrity will decrease Outcome: Progressing   Problem: Education: Goal: Knowledge of prescribed regimen will improve Outcome: Progressing   Problem: Activity: Goal: Ability to tolerate increased activity will improve Outcome: Progressing   Problem: Bowel/Gastric: Goal: Gastrointestinal status for postoperative course will improve Outcome: Progressing   Problem: Clinical Measurements: Goal: Postoperative complications will be avoided or  minimized Outcome: Progressing Goal: Signs and symptoms of graft occlusion will improve Outcome: Progressing   Problem: Skin Integrity: Goal: Demonstration of wound healing without infection will improve Outcome: Progressing   Problem: Education: Goal: Ability to describe self-care measures that may prevent or decrease complications (Diabetes Survival Skills Education) will improve Outcome: Progressing Goal: Individualized Educational Video(s) Outcome: Progressing   Problem: Coping: Goal: Ability to adjust to condition or change in health will improve Outcome: Progressing   Problem: Fluid Volume: Goal: Ability to maintain a balanced intake and output will improve Outcome: Progressing   Problem: Health Behavior/Discharge Planning: Goal: Ability to identify and utilize available resources and services will improve Outcome: Progressing Goal: Ability to manage health-related needs will improve Outcome: Progressing   Problem: Metabolic: Goal: Ability to maintain appropriate glucose levels will improve Outcome: Progressing   Problem: Nutritional: Goal: Maintenance of adequate nutrition will improve Outcome: Progressing Goal: Progress toward achieving an optimal weight will improve Outcome: Progressing   Problem: Skin Integrity: Goal: Risk for impaired skin integrity will decrease Outcome: Progressing   Problem: Tissue Perfusion: Goal: Adequacy of tissue perfusion will improve Outcome: Progressing

## 2022-08-09 NOTE — Progress Notes (Signed)
Physical Therapy Treatment Patient Details Name: Carla [name] MRN: [id number] DOB: 07-30-1961 Today's Date: 08/09/2022   History of Present Illness Pt is a 61 year old female s/p OR initially for Left axillary to common femoral artery bypass graft, Prevena wound VAC placed .  Post surgery patient had left foot pain and mottled leg was taken to the OR again , now status post thrombectomy of left axillofemoral bypass graft after presentingto the ED with progressively worsening left leg pain; PMH significant for arterial disease s/p right AKA, chronic diastolic dysfunction CHF, anxiety and depression, COPD, paroxysmal A-fib on chronic anticoagulation therapy    PT Comments    Pt received upright in bed agreeable to PT services. Endorses fatigue and LUE pain but agreeable to transfer to <> from [facility] endorsing need for BM.  Pt transfers with modA and bed features to EOB with multimodal cuing for sequencing UE's and LE's. Fair static sitting balance appreciated with supervision. Trialed +2 STS with RW and modA+2 and bed elevated. Pt unable to extend L hip/knees to attain upright to step pivot to [facility]. Pt seated with removal of RW and +2 modA and PT blocking L knee to maintain knee stability, with maxA+2 to transfer to [facility]. Unsuccessful BM thus requiring same level of assist back to EOB then minA At LLE to reutn to supine. Although pt completed mostly on her own this date, significant time and effort noted bringing LE's and shoulders into the bed with mod VC's to assist. Pt with all needs in reach. Remains appropriate for STR as pt continues to remain a high falls risk requiring significant external support from skilled professionals to safely mobilize OOB. Would not be safe to perform transfer with family support at this time. PT to continue POC and progress as able.    Recommendations for follow up therapy are one component of a multi-disciplinary discharge planning process, led by the attending physician.   Recommendations may be updated based on patient status, additional functional criteria and insurance authorization.  Follow Up Recommendations  Skilled nursing-short term rehab (<3 hours/day) Can patient physically be transported by private vehicle: No   Assistance Recommended at Discharge Frequent or constant Supervision/Assistance  Patient can return home with the following Two people to help with walking and/or transfers;Two people to help with bathing/dressing/bathroom;Assistance with cooking/housework;Assist for transportation;Help with stairs or ramp for entrance   Equipment Recommendations  Other (comment) (slide board if pt declining SNF and going home)    Recommendations for Other Services       Precautions / Restrictions Precautions Precautions: Fall Precaution Comments: dressing changed on distal R stump 10/13 Restrictions Weight Bearing Restrictions: No     Mobility  Bed Mobility Overal bed mobility: Needs Assistance Bed Mobility: Supine to Sit, Sit to Supine     Supine to sit: Mod assist, HOB elevated Sit to supine: Min assist   General bed mobility comments: MinA at LLE to return to bed. Patient Response: Cooperative  Transfers Overall transfer level: Needs assistance Equipment used: None Transfers: Bed to chair/wheelchair/BSC Sit to Stand: Min assist, +2 physical assistance, From elevated surface Stand pivot transfers: Max assist, +2 physical assistance              Ambulation/Gait               General Gait Details: Non-ambulatory at this time   Marine scientist Rankin (Stroke  Patients Only)       Balance Overall balance assessment: Needs assistance Sitting-balance support: Bilateral upper extremity supported, Feet supported Sitting balance-[name]: Fair       Standing balance-[name]: Poor Standing balance comment: Dependent on 2 person assist to stand with AD                             Cognition Arousal/Alertness: Awake/alert Behavior During Therapy: WFL for tasks assessed/performed, Anxious Overall Cognitive Status: Within Functional Limits for tasks assessed                                          Exercises Other Exercises Other Exercises: Education on benefits of OOB mobility for strengthening, reducing care giver burden, optimizing independence, skin integrity.    General Comments        Pertinent Vitals/Pain Pain Assessment Faces Pain Scale: Hurts little more Pain Location: RLE phantom limb pain Pain Descriptors / Indicators: Discomfort, Grimacing Pain Intervention(s): Limited activity within patient's tolerance, Repositioned    Home Living                          Prior Function            PT Goals (current goals can now be found in the care plan section) Acute Rehab PT Goals Patient Stated Goal: go home when safe PT Goal Formulation: With patient Time For Goal Achievement: 08/07/22 Potential to Achieve Goals: Fair Progress towards PT goals: Progressing toward goals    Frequency    Min 2X/week      PT Plan Current plan remains appropriate    Co-evaluation PT/OT/SLP Co-Evaluation/Treatment: Yes Reason for Co-Treatment: For patient/therapist safety;To address functional/ADL transfers PT goals addressed during session: Mobility/safety with mobility OT goals addressed during session: ADL's and self-care      AM-PAC PT "6 Clicks" Mobility   Outcome Measure  Help needed turning from your back to your side while in a flat bed without using bedrails?: A Lot Help needed moving from lying on your back to sitting on the side of a flat bed without using bedrails?: A Lot Help needed moving to and from a bed to a chair (including a wheelchair)?: A Lot Help needed standing up from a chair using your arms (e.g., wheelchair or bedside chair)?: A Lot Help needed to walk in hospital room?:  Total Help needed climbing 3-5 steps with a railing? : Total 6 Click Score: 10    End of Session Equipment Utilized During Treatment: Gait belt;Oxygen Activity Tolerance: Patient tolerated treatment well Patient left: in bed;with call bell/phone within reach;with bed alarm set Nurse Communication: Mobility status PT Visit Diagnosis: Other abnormalities of gait and mobility (R26.89);Muscle weakness (generalized) (M62.81);Pain Pain - Right/Left: Left Pain - part of body: Arm     Time: 9381-0175 PT Time Calculation (min) (ACUTE ONLY): 28 min  Charges:  $Therapeutic Activity: 8-22 mins                     Salem Caster. Fairly IV, PT, DPT Physical Therapist- [facility]  08/09/2022, 12:42 PM

## 2022-08-09 NOTE — Progress Notes (Signed)
       CROSS COVER NOTE  NAME: Carla [name] MRN: [id number] DOB : 1960-12-30 ATTENDING PHYSICIAN: '@ATTENDING'$ @    Date of Service   08/09/2022   HPI/Events of Note   Medication request received for surgical pain unrelieved by Norco. AKI is now resolved and creatinine 0.49  Interventions   Assessment/Plan:  Tramadol x1     This document was prepared using Dragon voice recognition software and may include unintentional dictation errors.  Neomia Glass DNP, MBA, FNP-BC Nurse Practitioner [facility] Pager [phone]

## 2022-08-09 NOTE — Assessment & Plan Note (Signed)
Patient developed left upper extremity edema around 10/3 to 10/4, [place] LUE no DVT but there is large (6.6 x 6.3 x 6.5 cm (volume = 140 cm^3)) hematoma under the left axillary surgical site. D/w vascular - no procedures planned for this (expected w/ anticoagulation). Hemoglobin currently stable. -Continue to monitor

## 2022-08-09 NOTE — TOC Progression Note (Signed)
Transition of Care [facility]) - Progression Note    Patient Details  Name: PRIM MORACE MRN: [id number] Date of Birth: 08-10-1961  Transition of Care [facility]) CM/SW [place], RN Phone Number: 08/09/2022, 8:55 AM  Clinical Narrative:     Attempted to reach [name] the patient's daughter, left a general Voice Mail asking for a call back  Expected Discharge Plan: [place] Barriers to Discharge: Continued Medical Work up  Expected Discharge Plan and Services Expected Discharge Plan: [place]   Discharge Planning Services: CM Consult   Living arrangements for the past 2 months: Single Family Home                 DME Arranged: N/A DME Agency: NA                   Social Determinants of Health (SDOH) Interventions    Readmission Risk Interventions    07/25/2022   10:49 AM 05/05/2022   10:52 AM 03/24/2022    9:51 AM  Readmission Risk Prevention Plan  Transportation Screening Complete Complete Complete  PCP or Specialist Appt within 3-5 Days   Complete  HRI or Home Care Consult   Complete  Social Work Consult for [place] Planning/Counseling   Complete  Palliative Care Screening   Not Applicable  Medication Review Press photographer) Complete Complete Complete  PCP or Specialist appointment within 3-5 days of discharge Complete Complete   HRI or Home Care Consult Complete    SW Recovery Care/Counseling Consult Not Complete Complete   SW Consult Not Complete Comments NA    Palliative Care Screening  Not [place] Patient Refused Patient Refused

## 2022-08-09 NOTE — TOC Progression Note (Addendum)
Transition of Care [facility]) - Progression Note    Patient Details  Name: MARCAYLA BUDGE MRN: [id number] Date of Birth: 1961-10-03  Transition of Care [facility]) CM/SW [place], RN Phone Number: 08/09/2022, 10:48 AM  Clinical Narrative:    Spoke with the patient and her grand daughter in the room, she is agreeable to go to [facility] SNF, PT will have to see her again as it has been a few days, I will send the [place] once they see the patient She is on Acute Oxygen   Expected Discharge Plan: [place] Barriers to Discharge: Continued Medical Work up  Expected Discharge Plan and Services Expected Discharge Plan: [place]   Discharge Planning Services: CM Consult   Living arrangements for the past 2 months: Single Family Home                 DME Arranged: N/A DME Agency: NA                   Social Determinants of Health (SDOH) Interventions    Readmission Risk Interventions    07/25/2022   10:49 AM 05/05/2022   10:52 AM 03/24/2022    9:51 AM  Readmission Risk Prevention Plan  Transportation Screening Complete Complete Complete  PCP or Specialist Appt within 3-5 Days   Complete  HRI or Home Care Consult   Complete  Social Work Consult for [place] Planning/Counseling   Complete  Palliative Care Screening   Not Applicable  Medication Review Press photographer) Complete Complete Complete  PCP or Specialist appointment within 3-5 days of discharge Complete Complete   HRI or Home Care Consult Complete    SW Recovery Care/Counseling Consult Not Complete Complete   SW Consult Not Complete Comments NA    Palliative Care Screening  Not [place] Patient Refused Patient Refused

## 2022-08-09 NOTE — Assessment & Plan Note (Signed)
Most likely with hematoma formation after the surgery.  S/p 2 unit of PRBC on 07/27/2022 and 1 unit on 07/29/2022. Hemoglobin currently stable. -Current continue to monitor

## 2022-08-09 NOTE — NC FL2 (Signed)
[place] LEVEL OF CARE SCREENING TOOL     IDENTIFICATION  Patient Name: Carla [name] Birthdate: [birth year] Sex: female Admission Date (Current Location): 07/22/2022  [facility] and [place] Number:  Engineering geologist and Address:  [facility], [address], [place], [place] [address]      Provider Number: [id number]  Attending Physician Name and Address:  Lorella Nimrod, MD  Relative Name and Phone Number:  [name] daughter [phone]    Current Level of Care: Hospital Recommended Level of Care: [place] Prior Approval Number:    Date Approved/Denied:   PASRR Number: [id number]  Discharge Plan: SNF    Current Diagnoses: Patient Active Problem List   Diagnosis Date Noted   Hematoma 07/26/2022   Abscess of knee, right 07/24/2022   Wound infection 05/02/2022   Acute respiratory failure with hypoxia ([place]) 05/02/2022   Lactic acidosis 05/02/2022   Hypoalbuminemia due to protein-calorie malnutrition ([place]) 05/02/2022   Atherosclerosis of artery of extremity with rest pain ([place]) 04/06/2022   Rectal bleeding 03/21/2022   Hypokalemia 02/03/2022   Chronic anticoagulation    Anxiety and depression    Palpitations    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[place]) 12/03/2021   Critical limb ischemia of left lower extremity ([place]) 11/30/2021   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   Arterial occlusion 11/11/2021   CAD (coronary artery disease) 07/07/2021   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[place]) 06/27/2021   Ischemic pain of right foot 06/27/2021   PAD (peripheral artery disease) ([place]) 05/03/2021   Ischemia of extremity 01/24/2021   Tobacco abuse counseling 10/26/2020   Smoker 10/26/2020   Chronic diastolic CHF (congestive heart failure) ([place]) 10/06/2020   Neuropathic pain 08/19/2020   Bursitis of right shoulder 12/01/2019   Chronic pain 12/01/2019   Primary osteoarthritis involving multiple joints 12/01/2019   Ischemia of  left lower extremity 11/17/2019   Carpal tunnel syndrome, right 11/10/2019   Health care maintenance 03/26/2019   Disorder of skeletal system 03/26/2019   Problems influencing health status 03/26/2019   Chronic low back pain (Secondary area of Pain) (Bilateral) w/o sciatica 03/26/2019   Rib pain on left side 03/25/2019   Aortic atherosclerosis ([place]) 09/12/2018   COPD (chronic obstructive pulmonary disease) ([place]) 09/12/2018   Moderate episode of recurrent major depressive disorder ([place]) 09/12/2018   Lumbar spondylosis 09/06/2018   History of total knee replacement (Right) 07/11/2018   Ventral hernia with obstruction and without gangrene 07/02/2018   PAF (paroxysmal atrial fibrillation) ([place]) 06/15/2018   Accelerated hypertension 04/08/2018   Acute encephalopathy 04/08/2018   DDD (degenerative disc disease), lumbosacral 09/25/2017   Osteoarthritis of knee 09/25/2017   Adjustment disorder with anxiety 08/15/2017   Chronic idiopathic constipation    Protein-calorie malnutrition, severe 08/08/2017   SBO (small bowel obstruction) ([place]) 08/06/2017   Chronic mesenteric ischemia (HCC)    AKI (acute kidney injury) ([place]) 07/29/2017   Hypotension 07/29/2017   Embolus of superior mesenteric artery ([place])    Abdominal pain    Occlusive mesenteric ischemia ([place]) 06/19/2017   Acute focal ischemia of small intestine (HCC)    Acute right-sided low back pain with right-sided sciatica 06/13/2017   Osteoarthritis of knee (Bilateral) 06/13/2017   Chronic pain syndrome 09/13/2016   Morbid obesity with BMI of 40.0-44.9, adult ([place]) 07/16/2016   Personal history of other malignant neoplasm of skin 06/01/2016   Chronic knee pain (Primary Area of Pain) (Bilateral) 01/19/2016   Essential hypertension 01/19/2016   GERD (gastroesophageal reflux disease) 01/19/2016  Mixed hyperlipidemia 01/19/2016   Prediabetes 01/19/2016   Recurrent major depressive disorder ([place]) 01/19/2016   Urinary tract infection  01/09/2014   Chronic pelvic pain in female 11/14/2013   Mixed stress and urge urinary incontinence 11/14/2013   Chest pain 08/06/2012    Orientation RESPIRATION BLADDER Height & Weight     Self, Time, Situation, Place  Normal, O2 (2 liters) Continent, External catheter Weight: 80.7 kg Height:  '5\' 2"'$  (157.5 cm)  BEHAVIORAL SYMPTOMS/MOOD NEUROLOGICAL BOWEL NUTRITION STATUS      Continent Diet (see dc summary)  AMBULATORY STATUS COMMUNICATION OF NEEDS Skin   Extensive Assist Verbally Normal, Surgical wounds (right AKA)                       Personal Care Assistance Level of Assistance  Bathing, Feeding, Dressing Bathing Assistance: Maximum assistance Feeding assistance: Limited assistance Dressing Assistance: Maximum assistance     Functional Limitations Info  Sight, Hearing, Speech Sight Info: Adequate Hearing Info: Adequate Speech Info: Adequate    SPECIAL CARE FACTORS FREQUENCY  PT (By licensed PT), OT (By licensed OT)     PT Frequency: 5 times per week OT Frequency: 5 times per week            Contractures Contractures Info: Not present    Additional Factors Info  Code Status, Allergies Code Status Info: full code Allergies Info: bactrim, Gabapentin           Current Medications (08/09/2022):  This is the current hospital active medication list Current Facility-Administered Medications  Medication Dose Route Frequency Provider Last Rate Last Admin   0.9 %  sodium chloride infusion (Manually program via Guardrails IV Fluids)   Intravenous Once Emeterio Reeve, DO       0.9 %  sodium chloride infusion  500 mL Intravenous Once PRN Serafina Mitchell, MD       acetaminophen (TYLENOL) tablet 325-650 mg  325-650 mg Oral Q4H PRN Oswald Hillock, RPH       Or   acetaminophen (TYLENOL) suppository 325-650 mg  325-650 mg Rectal Q4H PRN Oswald Hillock, RPH       alum & mag hydroxide-simeth (MAALOX/MYLANTA) 200-200-20 MG/5ML suspension 15-30 mL  15-30 mL Oral  Q2H PRN Serafina Mitchell, MD       apixaban Arne [place]) tablet 5 mg  5 mg Oral BID Emeterio Reeve, DO   5 mg at 08/09/22 0900   aspirin EC tablet 81 mg  81 mg Oral Q0600 Serafina Mitchell, MD   81 mg at 08/09/22 0522   atorvastatin (LIPITOR) tablet 80 mg  80 mg Oral QHS Athena Masse, MD   80 mg at 08/08/22 2222   Chlorhexidine Gluconate Cloth 2 % PADS 6 each  6 each Topical Daily Nolberto Hanlon, MD   6 each at 08/08/22 2257   citalopram (CELEXA) tablet 40 mg  40 mg Oral Daily Judd Gaudier V, MD   40 mg at 08/09/22 0901   docusate sodium (COLACE) capsule 100 mg  100 mg Oral Daily Serafina Mitchell, MD   100 mg at 08/09/22 0900   guaiFENesin-dextromethorphan (ROBITUSSIN DM) 100-10 MG/5ML syrup 15 mL  15 mL Oral Q4H PRN Serafina Mitchell, MD       hydrALAZINE (APRESOLINE) injection 5 mg  5 mg Intravenous Q20 Min PRN Serafina Mitchell, MD       HYDROcodone-acetaminophen (NORCO/VICODIN) 5-325 MG per tablet 1-2 tablet  1-2 tablet Oral Q4H PRN Kurtis Bushman,  Gwynneth Albright, MD   2 tablet at 08/09/22 0902   insulin aspart (novoLOG) injection 0-15 Units  0-15 Units Subcutaneous Q4H Nolberto Hanlon, MD   2 Units at 08/09/22 1232   isosorbide mononitrate (IMDUR) 24 hr tablet 60 mg  60 mg Oral Daily Athena Masse, MD   60 mg at 08/09/22 0901   labetalol (NORMODYNE) injection 10 mg  10 mg Intravenous Q10 min PRN Serafina Mitchell, MD   10 mg at 07/25/22 0633   lidocaine (LIDODERM) 5 % 1 patch  1 patch Transdermal Q24H Nolberto Hanlon, MD   1 patch at 08/08/22 1649   LORazepam (ATIVAN) injection 1 mg  1 mg Intravenous Q4H PRN Emeterio Reeve, DO   1 mg at 07/26/22 2122   magnesium sulfate IVPB 2 g 50 mL  2 g Intravenous Daily PRN Serafina Mitchell, MD       methocarbamol (ROBAXIN) tablet 500 mg  500 mg Oral Q8H PRN Athena Masse, MD   500 mg at 08/07/22 2236   metoprolol tartrate (LOPRESSOR) injection 2-5 mg  2-5 mg Intravenous Q2H PRN Serafina Mitchell, MD       metoprolol tartrate (LOPRESSOR) tablet 12.5 mg  12.5 mg Oral BID  Nolberto Hanlon, MD   12.5 mg at 08/09/22 0900   ondansetron (ZOFRAN) tablet 4 mg  4 mg Oral Q6H PRN Athena Masse, MD       Or   ondansetron [facility]) injection 4 mg  4 mg Intravenous Q6H PRN Athena Masse, MD       Oral care mouth rinse  15 mL Mouth Rinse PRN Emeterio Reeve, DO       pantoprazole (PROTONIX) EC tablet 40 mg  40 mg Oral Daily Serafina Mitchell, MD   40 mg at 08/09/22 0900   phenol (CHLORASEPTIC) mouth spray 1 spray  1 spray Mouth/Throat PRN Serafina Mitchell, MD       polyethylene glycol (MIRALAX / GLYCOLAX) packet 17 g  17 g Oral Daily PRN Serafina Mitchell, MD   17 g at 08/08/22 0104   potassium chloride SA (KLOR-CON M) CR tablet 20-40 mEq  20-40 mEq Oral Daily PRN Serafina Mitchell, MD       pregabalin (LYRICA) capsule 150 mg  150 mg Oral TID Emeterio Reeve, DO   150 mg at 08/09/22 2094     Discharge Medications: Please see discharge summary for a list of discharge medications.  Relevant Imaging Results:  Relevant Lab Results:   Additional Information SS#- [id number]  Conception [place], RN

## 2022-08-09 NOTE — Progress Notes (Signed)
Progress Note   Patient: Carla [name] XIP:[id number] DOB: 03/26/1961 DOA: 07/22/2022     18 DOS: the patient was seen and examined on 08/09/2022   Brief hospital course: Carla [name] is a 61 y.o. female with medical history significant for peripheral arterial disease s/p right AKA, chronic diastolic dysfunction CHF, anxiety and depression, COPD, paroxysmal A-fib on chronic anticoagulation therapy, who presents to the ED with left leg pain for the past 1 week which had been progressively worsening.  [name] 09/30: CTA found with ischemic LLE. The patient was evaluated by vascular surgeon, Dr. Trula Slade in the ED who will take her to the OR on Sunday 10/1.  Patient started on heparin. Admitted to hospitalist service.  . 10/01: pt went to OR for common femoral artery bypass graft, post OR woke up c/o leg pain, mottled leg, went back to OR for thrombectomy of Left axillary femoral bypass graft with revision. Transferred to ICU.  [name] 10/02: Started on cefepime for concern for abscess of right BKA stump . 10/03-10/04: Left upper extremity swelling noted overnight, ultrasound pending as of this AM --> no DVT but (+)hematoma. D/w vascular - no procedures planned for this (expected w/ anticoagulation) and ok to transfer to floor. Pain control has been a challenge but she is better w/ anxiolytics.  [name] 10/05: Afib w/ occasional rapid rate and hypotension.  Hemoglobin down to 6.2.  Transfused 2 units PRBC, VS stabilized.  . 10/06: Hemoglobin 9.0 posttransfusion, 7.5 this morning.  Follow H/H midday today Hgb 7.7, hold off on transfusion for now, CBC in AM. PT recommending SNF. [name] 10/07: concern for thrombosis to toes, vascular following w/ pharmacy for heparin management. Transfusing another unit blood for Hgb 7.0  . 10/08: continues to c/o pain LUE and RLE. Will image RLE --> CT (+) Low-density fluid collection along the distal aspect of the right femoral amputation. This is nonspecific and could be sterile or infected. No  gas or  foreign body is seen associated with this collection. No evidence of osteomyelitis or hip joint effusion. . 10/09: Remains on heparin per vascular. Pain control still a challenge . 10/11 IV Dilaudid discontinued.  Work with PT and sitting in chair today. . 10/12 no overnight issues . 10/15 no new issues . 10/16 no issues . 10/17 snf pending . 10/18: Patient continued to experience significant pain, stating that Norco was not working well.  Asking for Dilaudid. . Added MS Contin-Will avoid IV pain medications at this time.  Stable for discharge to SNF once bed is available.    Consultants:   Vascular Surgery   Procedures:  10/01: Left axillary to common femoral artery bypass graft to treat ischemia  10/01: Thrombectomy of left axillary femoral bypass graft with revision  Antimicrobials:  . Cefazolin x3 doses  . Cefepime 10/2 - 10/9          Assessment and Plan: * Critical limb ischemia of left lower extremity (HCC) Peripheral artery disease s/p right AKA S/p OR initially 10/01 for Left axillary to common femoral artery bypass graft, Prevena wound VAC placed . Post surgery patient had left foot pain and mottled leg was taken to the OR again 10/01, thrombectomy of left axillofemoral bypass graft.  Patient was initially managed with heparin infusion and then started on Eliquis. IV pain medications were discontinued. Pain remain the main complaint. -Add MS Contin -Continue with Norco-Will avoid IV pain medications. -Continue with Eliquis -Continue with aspirin and statin. -PT is recommending SNF-medically stable, TOC  to find a placement.  Hematoma Patient developed left upper extremity edema around 10/3 to 10/4, [place] LUE no DVT but there is large (6.6 x 6.3 x 6.5 cm (volume = 140 cm^3)) hematoma under the left axillary surgical site. D/w vascular - no procedures planned for this (expected w/ anticoagulation). Hemoglobin currently stable. -Continue to monitor  Acute  blood loss anemia Most likely with hematoma formation after the surgery.  S/p 2 unit of PRBC on 07/27/2022 and 1 unit on 07/29/2022. Hemoglobin currently stable. -Current continue to monitor  AKI (acute kidney injury) ([place]) Resolved. -Monitor renal function periodically. -Avoid nephrotoxins  PAF (paroxysmal atrial fibrillation) (HCC) - Continue metoprolol and Eliquis  Chronic diastolic CHF (congestive heart failure) (HCC) Clinically compensated Continue metoprolol and Imdur  Abscess of knee, right Patient developed some fluid collection at right AKA site.  CT with low-density fluid collection, nonspecific.  No gas or foreign body seen.  No evidence of osteomyelitis or hip joint infusion. Completed the course of antibiotics. -Continue with wound care   Subjective: Patient was seen and examined today.  Continued to have left upper extremity pain and asking for IV Dilaudid.  Physical Exam: Vitals:   08/08/22 0748 08/08/22 1602 08/08/22 2221 08/09/22 0812  BP: 121/74 (!) 105/55 (!) 101/53 124/62  Pulse: 85 76 78 76  Resp: '16 17 18 16  '$ Temp: 98.1 F (36.7 C) 99 F (37.2 C) 98.7 F (37.1 C) 97.7 F (36.5 C)  TempSrc:      SpO2: 96% 99% 99% 97%  Weight:      Height:       General.  Ill-appearing lady, in no acute distress. Pulmonary.  Lungs clear bilaterally, normal respiratory effort. CV.  Regular rate and rhythm, no JVD, rub or murmur. Abdomen.  Soft, nontender, nondistended, BS positive. CNS.  Alert and oriented .  No focal neurologic deficit. Extremities.  Right AKA, 1+ left lower extremity edema, significant bruising and edema and left upper chest surrounding surgical site/ Psychiatry.  Judgment and insight appears normal.  Data Reviewed: Prior data reviewed  Family Communication: Discussed with granddaughter at bedside  Disposition: Status is: Inpatient Remains inpatient appropriate because: Severity of illness   Planned Discharge Destination: Skilled nursing  facility  DVT prophylaxis.  Eliquis Time spent: 45 minutes  This record has been created using Systems analyst. Errors have been sought and corrected,but may not always be located. Such creation errors do not reflect on the standard of care.  Author: Lorella Nimrod, MD 08/09/2022 1:25 PM  For on call review [email or web address].

## 2022-08-09 NOTE — Assessment & Plan Note (Signed)
Resolved. -Monitor renal function periodically. -Avoid nephrotoxins

## 2022-08-09 NOTE — TOC Progression Note (Signed)
Transition of Care [facility]) - Progression Note    Patient Details  Name: BIJAL SIGLIN MRN: [id number] Date of Birth: 02/09/1961  Transition of Care [facility]) CM/SW [place], RN Phone Number: 08/09/2022, 12:45 PM  Clinical Narrative:    Sent Bedsearcj, PASSR obtained, FL2 completed   Expected Discharge Plan: [place] Barriers to Discharge: Continued Medical Work up  Expected Discharge Plan and Services Expected Discharge Plan: [place]   Discharge Planning Services: CM Consult   Living arrangements for the past 2 months: Single Family Home                 DME Arranged: N/A DME Agency: NA                   Social Determinants of Health (SDOH) Interventions    Readmission Risk Interventions    07/25/2022   10:49 AM 05/05/2022   10:52 AM 03/24/2022    9:51 AM  Readmission Risk Prevention Plan  Transportation Screening Complete Complete Complete  PCP or Specialist Appt within 3-5 Days   Complete  HRI or [place]   Complete  Social Work Consult for [place] Planning/Counseling   Complete  Palliative Care Screening   Not Applicable  Medication Review Press photographer) Complete Complete Complete  PCP or Specialist appointment within 3-5 days of discharge Complete Complete   HRI or Home Care Consult Complete    SW Recovery Care/Counseling Consult Not Complete Complete   SW Consult Not Complete Comments NA    Palliative Care Screening  Not [place] Patient Refused Patient Refused

## 2022-08-09 NOTE — Assessment & Plan Note (Signed)
Patient developed some fluid collection at right AKA site.  CT with low-density fluid collection, nonspecific.  No gas or foreign body seen.  No evidence of osteomyelitis or hip joint infusion. Completed the course of antibiotics. -Continue with wound care

## 2022-08-10 DIAGNOSIS — I70222 Atherosclerosis of native arteries of extremities with rest pain, left leg: Secondary | ICD-10-CM | POA: Diagnosis not present

## 2022-08-10 LAB — GLUCOSE, CAPILLARY
Glucose-Capillary: 108 mg/dL — ABNORMAL HIGH (ref 70–99)
Glucose-Capillary: 110 mg/dL — ABNORMAL HIGH (ref 70–99)
Glucose-Capillary: 118 mg/dL — ABNORMAL HIGH (ref 70–99)
Glucose-Capillary: 134 mg/dL — ABNORMAL HIGH (ref 70–99)
Glucose-Capillary: 142 mg/dL — ABNORMAL HIGH (ref 70–99)
Glucose-Capillary: 157 mg/dL — ABNORMAL HIGH (ref 70–99)

## 2022-08-10 NOTE — Plan of Care (Signed)
  Problem: Education: Goal: Knowledge of General Education information will improve Description: Including pain rating scale, medication(s)/side effects and non-pharmacologic comfort measures Outcome: Progressing   Problem: Health Behavior/Discharge Planning: Goal: Ability to manage health-related needs will improve Outcome: Progressing   Problem: Clinical Measurements: Goal: Ability to maintain clinical measurements within normal limits will improve Outcome: Progressing Goal: Will remain free from infection Outcome: Progressing Goal: Diagnostic test results will improve Outcome: Progressing Goal: Respiratory complications will improve Outcome: Progressing Goal: Cardiovascular complication will be avoided Outcome: Progressing   Problem: Activity: Goal: Risk for activity intolerance will decrease Outcome: Progressing   Problem: Nutrition: Goal: Adequate nutrition will be maintained Outcome: Progressing   Problem: Coping: Goal: Level of anxiety will decrease Outcome: Progressing   Problem: Elimination: Goal: Will not experience complications related to bowel motility Outcome: Progressing Goal: Will not experience complications related to urinary retention Outcome: Progressing   Problem: Pain Managment: Goal: General experience of comfort will improve Outcome: Progressing   Problem: Safety: Goal: Ability to remain free from injury will improve Outcome: Progressing   Problem: Skin Integrity: Goal: Risk for impaired skin integrity will decrease Outcome: Progressing   Problem: Education: Goal: Knowledge of prescribed regimen will improve Outcome: Progressing   Problem: Activity: Goal: Ability to tolerate increased activity will improve Outcome: Progressing   Problem: Bowel/Gastric: Goal: Gastrointestinal status for postoperative course will improve Outcome: Progressing   Problem: Clinical Measurements: Goal: Postoperative complications will be avoided or  minimized Outcome: Progressing Goal: Signs and symptoms of graft occlusion will improve Outcome: Progressing   Problem: Skin Integrity: Goal: Demonstration of wound healing without infection will improve Outcome: Progressing   Problem: Education: Goal: Ability to describe self-care measures that may prevent or decrease complications (Diabetes Survival Skills Education) will improve Outcome: Progressing Goal: Individualized Educational Video(s) Outcome: Progressing   Problem: Coping: Goal: Ability to adjust to condition or change in health will improve Outcome: Progressing   Problem: Fluid Volume: Goal: Ability to maintain a balanced intake and output will improve Outcome: Progressing   Problem: Health Behavior/Discharge Planning: Goal: Ability to identify and utilize available resources and services will improve Outcome: Progressing Goal: Ability to manage health-related needs will improve Outcome: Progressing   Problem: Metabolic: Goal: Ability to maintain appropriate glucose levels will improve Outcome: Progressing   Problem: Nutritional: Goal: Maintenance of adequate nutrition will improve Outcome: Progressing Goal: Progress toward achieving an optimal weight will improve Outcome: Progressing   Problem: Skin Integrity: Goal: Risk for impaired skin integrity will decrease Outcome: Progressing   Problem: Tissue Perfusion: Goal: Adequacy of tissue perfusion will improve Outcome: Progressing

## 2022-08-10 NOTE — Assessment & Plan Note (Addendum)
Peripheral artery disease s/p right AKA S/p OR initially 10/01 for Left axillary to common femoral artery bypass graft, Prevena wound VAC placed . Post surgery patient had left foot pain and mottled leg was taken to the OR again 10/01, thrombectomy of left axillofemoral bypass graft.  Patient was initially managed with heparin infusion and then started on Eliquis. IV pain medications were discontinued. Some improvement in pain by adding MS Contin -Continue MS Contin-dose can be titrated up if needed -Continue with Norco-Will avoid IV pain medications. -Continue with Eliquis -Continue with aspirin and statin. -PT is recommending SNF-medically stable, had a bed offer-pending insurance authorization.

## 2022-08-10 NOTE — TOC Progression Note (Signed)
Transition of Care [facility]) - Progression Note    Patient Details  Name: Carla [name] MRN: [id number] Date of Birth: 07-22-1961  Transition of Care [facility]) CM/SW [place], RN Phone Number: 08/10/2022, 12:23 PM  Clinical Narrative:    Reviewed the bed offers with the patient, she chose [facility] in [place], I notified Ricky at [employer], [place] pending   Expected Discharge Plan: [place] Barriers to Discharge: Continued Medical Work up  Expected Discharge Plan and Services Expected Discharge Plan: [place]   Discharge Planning Services: CM Consult   Living arrangements for the past 2 months: Single Family Home                 DME Arranged: N/A DME Agency: NA                   Social Determinants of Health (SDOH) Interventions    Readmission Risk Interventions    07/25/2022   10:49 AM 05/05/2022   10:52 AM 03/24/2022    9:51 AM  Readmission Risk Prevention Plan  Transportation Screening Complete Complete Complete  PCP or Specialist Appt within 3-5 Days   Complete  HRI or Home Care Consult   Complete  Social Work Consult for [place] Planning/Counseling   Complete  Palliative Care Screening   Not Applicable  Medication Review Press photographer) Complete Complete Complete  PCP or Specialist appointment within 3-5 days of discharge Complete Complete   HRI or Home Care Consult Complete    SW Recovery Care/Counseling Consult Not Complete Complete   SW Consult Not Complete Comments NA    Palliative Care Screening  Not [place] Patient Refused Patient Refused

## 2022-08-10 NOTE — Progress Notes (Signed)
Physical Therapy Treatment Patient Details Name: Carla [name] MRN: [id number] DOB: Oct 04, 1961 Today's Date: 08/10/2022   History of Present Illness Pt is a 61 year old female s/p OR initially for Left axillary to common femoral artery bypass graft, Prevena wound VAC placed .  Post surgery patient had left foot pain and mottled leg was taken to the OR again , now status post thrombectomy of left axillofemoral bypass graft after presentingto the ED with progressively worsening left leg pain; PMH significant for arterial disease s/p right AKA, chronic diastolic dysfunction CHF, anxiety and depression, COPD, paroxysmal A-fib on chronic anticoagulation therapy    PT Comments    Pt has shown functional improvement over this past week. Today she demonstrated ability to transfer from supine to sitting EOB with use of rail and Mod/MinA. Sitting EOB x 20 minutes with single LE supported on floor, no LOB or postural lean. Pt declined transferring to bedside recliner due to c/o fatigue. Overall great improvement, pt remains motivated to return to baseline LOF and return home after short term SNF placement.   Recommendations for follow up therapy are one component of a multi-disciplinary discharge planning process, led by the attending physician.  Recommendations may be updated based on patient status, additional functional criteria and insurance authorization.  Follow Up Recommendations  Skilled nursing-short term rehab (<3 hours/day) Can patient physically be transported by private vehicle: No   Assistance Recommended at Discharge Frequent or constant Supervision/Assistance  Patient can return home with the following Two people to help with walking and/or transfers;Two people to help with bathing/dressing/bathroom;Assistance with cooking/housework;Assist for transportation;Help with stairs or ramp for entrance   Equipment Recommendations       Recommendations for Other Services       Precautions /  Restrictions Precautions Precautions: Fall Precaution Comments: dressing changed on distal R stump 10/13 Restrictions Weight Bearing Restrictions: No     Mobility  Bed Mobility Overal bed mobility: Needs Assistance Bed Mobility: Supine to Sit, Sit to Supine Rolling: Mod assist   Supine to sit: Mod assist, HOB elevated Sit to supine: Min assist   General bed mobility comments: MinA at LLE to return to bed.    Transfers                        Ambulation/Gait                   Stairs             Wheelchair Mobility    Modified Rankin (Stroke Patients Only)       Balance Overall balance assessment: Needs assistance Sitting-balance support: Bilateral upper extremity supported, Feet supported Sitting balance-[name]: Fair Sitting balance - Comments: pt is able to maintain sitting balance with close supervision x 20 minutes                                    Cognition Arousal/Alertness: Awake/alert Behavior During Therapy: WFL for tasks assessed/performed, Anxious Overall Cognitive Status: Within Functional Limits for tasks assessed Area of Impairment: Safety/judgement                 Orientation Level: Disoriented to, Situation Current Attention Level: Focused Memory: Decreased short-term memory Following Commands: Follows one step commands with increased time Safety/Judgement: Decreased awareness of safety, Decreased awareness of deficits Awareness: Intellectual Problem Solving: Slow processing, Decreased initiation, Difficulty sequencing, Requires  verbal cues, Requires tactile cues General Comments: Pt is A and oriented however unwilling to attemptbstanding due to anxiety. was agreeable to slideboard transfer to recliner towards R with armrest lowered        Exercises      General Comments        Pertinent Vitals/Pain Pain Assessment Pain Assessment: 0-10 Pain Score: 4  Pain Location: L shoulder Pain  Descriptors / Indicators: Discomfort, Grimacing Pain Intervention(s): Monitored during session, Premedicated before session    Home Living                          Prior Function            PT Goals (current goals can now be found in the care plan section) Acute Rehab PT Goals Patient Stated Goal: go home when safe    Frequency    Min 2X/week      PT Plan Current plan remains appropriate    Co-evaluation PT/OT/SLP Co-Evaluation/Treatment: Yes            AM-PAC PT "6 Clicks" Mobility   Outcome Measure  Help needed turning from your back to your side while in a flat bed without using bedrails?: A Lot Help needed moving from lying on your back to sitting on the side of a flat bed without using bedrails?: A Lot Help needed moving to and from a bed to a chair (including a wheelchair)?: A Lot Help needed standing up from a chair using your arms (e.g., wheelchair or bedside chair)?: A Lot Help needed to walk in hospital room?: Total Help needed climbing 3-5 steps with a railing? : Total 6 Click Score: 10    End of Session Equipment Utilized During Treatment: Gait belt;Oxygen Activity Tolerance: Patient tolerated treatment well Patient left: in bed;with call bell/phone within reach;with bed alarm set Nurse Communication: Mobility status PT Visit Diagnosis: Other abnormalities of gait and mobility (R26.89);Muscle weakness (generalized) (M62.81);Pain Pain - Right/Left: Left Pain - part of body: Arm     Time: 0814-4818 PT Time Calculation (min) (ACUTE ONLY): 32 min  Charges:  $Therapeutic Activity: 23-37 mins                    Mikel Cella, PTA    [name] 08/10/2022, 12:45 PM

## 2022-08-10 NOTE — Care Management Important Message (Signed)
Important Message  Patient Details  Name: Carla [name] MRN: [id number] Date of Birth: [birth year]   Medicare Important Message Given:  Yes     Juliann Pulse A Jodie Leiner 08/10/2022, 11:54 AM

## 2022-08-10 NOTE — Progress Notes (Signed)
Progress Note   Patient: Carla [name] QMG:[id number] DOB: 1961/01/26 DOA: 07/22/2022     19 DOS: the patient was seen and examined on 08/10/2022   Brief hospital course: TEMICA RIGHETTI is a 61 y.o. female with medical history significant for peripheral arterial disease s/p right AKA, chronic diastolic dysfunction CHF, anxiety and depression, COPD, paroxysmal A-fib on chronic anticoagulation therapy, who presents to the ED with left leg pain for the past 1 week which had been progressively worsening.  09/30: CTA found with ischemic LLE. The patient was evaluated by vascular surgeon, Dr. Trula Slade in the ED who will take her to the OR on Sunday 10/1.  Patient started on heparin. Admitted to hospitalist service.  10/01: pt went to OR for common femoral artery bypass graft, post OR woke up c/o leg pain, mottled leg, went back to OR for thrombectomy of Left axillary femoral bypass graft with revision. Transferred to ICU.  10/02: Started on cefepime for concern for abscess of right BKA stump 10/03-10/04: Left upper extremity swelling noted overnight, ultrasound pending as of this AM --> no DVT but (+)hematoma. D/w vascular - no procedures planned for this (expected w/ anticoagulation) and ok to transfer to floor. Pain control has been a challenge but she is better w/ anxiolytics.  10/05: Afib w/ occasional rapid rate and hypotension.  Hemoglobin down to 6.2.  Transfused 2 units PRBC, VS stabilized.  10/06: Hemoglobin 9.0 posttransfusion, 7.5 this morning.  Follow H/H midday today Hgb 7.7, hold off on transfusion for now, CBC in AM. PT recommending SNF. 10/07: concern for thrombosis to toes, vascular following w/ pharmacy for heparin management. Transfusing another unit blood for Hgb 7.0  10/08: continues to c/o pain LUE and RLE. Will image RLE --> CT (+) Low-density fluid collection along the distal aspect of the right femoral amputation. This is nonspecific and could be sterile or infected. No gas or  foreign  body is seen associated with this collection. No evidence of osteomyelitis or hip joint effusion. 10/09: Remains on heparin per vascular. Pain control still a challenge 10/11 IV Dilaudid discontinued.  Work with PT and sitting in chair today. 10/12 no overnight issues 10/15 no new issues 10/16 no issues 10/17 snf pending 10/18: Patient continued to experience significant pain, stating that Norco was not working well.  Asking for Dilaudid. Added MS Contin-Will avoid IV pain medications at this time.  Stable for discharge to SNF once bed is available. 10/19: Hemodynamically stable.  Pending disposition, now had a bed offer-pending insurance authorization.  MS Contin helping with her pain.    Consultants:  Vascular Surgery   Procedures: 10/01: Left axillary to common femoral artery bypass graft to treat ischemia 10/01: Thrombectomy of left axillary femoral bypass graft with revision  Antimicrobials:  Cefazolin x3 doses  Cefepime 10/2 - 10/9         Assessment and Plan: * Critical limb ischemia of left lower extremity ([place]) Peripheral artery disease s/p right AKA S/p OR initially 10/01 for Left axillary to common femoral artery bypass graft, Prevena wound VAC placed .  Post surgery patient had left foot pain and mottled leg was taken to the OR again 10/01, thrombectomy of left axillofemoral bypass graft.  Patient was initially managed with heparin infusion and then started on Eliquis. IV pain medications were discontinued. Some improvement in pain by adding MS Contin -Continue MS Contin-dose can be titrated up if needed -Continue with Norco-Will avoid IV pain medications. -Continue with Eliquis -Continue with aspirin  and statin. -PT is recommending SNF-medically stable, had a bed offer-pending insurance authorization.  Hematoma Patient developed left upper extremity edema around 10/3 to 10/4, [place] LUE no DVT but there is large (6.6 x 6.3 x 6.5 cm (volume = 140 cm^3)) hematoma  under the left axillary surgical site. D/w vascular - no procedures planned for this (expected w/ anticoagulation). Hemoglobin currently stable. -Continue to monitor  Acute blood loss anemia Most likely with hematoma formation after the surgery.  S/p 2 unit of PRBC on 07/27/2022 and 1 unit on 07/29/2022. Hemoglobin currently stable. -Current continue to monitor  AKI (acute kidney injury) ([place]) Resolved. -Monitor renal function periodically. -Avoid nephrotoxins  PAF (paroxysmal atrial fibrillation) (HCC) - Continue metoprolol and Eliquis  Chronic diastolic CHF (congestive heart failure) (HCC) Clinically compensated Continue metoprolol and Imdur  Abscess of knee, right Patient developed some fluid collection at right AKA site.  CT with low-density fluid collection, nonspecific.  No gas or foreign body seen.  No evidence of osteomyelitis or hip joint infusion. Completed the course of antibiotics. -Continue with wound care   Subjective: Patient was seen and examined today.  Pain seems better controlled with addition of MS Contin.  No new complaints.  Physical Exam: Vitals:   08/09/22 0812 08/09/22 1552 08/09/22 2328 08/10/22 0754  BP: 124/62 (!) 103/57 113/64 (!) 97/56  Pulse: 76 75 79 76  Resp: '16 18 17   '$ Temp: 97.7 F (36.5 C) 97.9 F (36.6 C) 98.9 F (37.2 C) 98.4 F (36.9 C)  TempSrc:      SpO2: 97% 98% 99% 99%  Weight:      Height:       General.  Well-developed lady, in no acute distress. Pulmonary.  Lungs clear bilaterally, normal respiratory effort. CV.  Regular rate and rhythm, no JVD, rub or murmur. Abdomen.  Soft, nontender, nondistended, BS positive. CNS.  Alert and oriented .  No focal neurologic deficit. Extremities.  Right AKA, left with 1+ upper and lower extremity edema, pulses intact. Psychiatry.  Judgment and insight appears normal.   Data Reviewed: Prior data reviewed  Family Communication: Discussed with granddaughter at  bedside  Disposition: Status is: Inpatient Remains inpatient appropriate because: Severity of illness   Planned Discharge Destination: Skilled nursing facility  DVT prophylaxis.  Eliquis Time spent: 40 minutes  This record has been created using Systems analyst. Errors have been sought and corrected,but may not always be located. Such creation errors do not reflect on the standard of care.  Author: Lorella Nimrod, MD 08/10/2022 3:20 PM  For on call review [email or web address].

## 2022-08-11 LAB — GLUCOSE, CAPILLARY
Glucose-Capillary: 119 mg/dL — ABNORMAL HIGH (ref 70–99)
Glucose-Capillary: 122 mg/dL — ABNORMAL HIGH (ref 70–99)
Glucose-Capillary: 136 mg/dL — ABNORMAL HIGH (ref 70–99)
Glucose-Capillary: 119 mg/dL — ABNORMAL HIGH (ref 70–99)
Glucose-Capillary: 169 mg/dL — ABNORMAL HIGH (ref 70–99)

## 2022-08-11 MED ORDER — MIDODRINE HCL 5 MG PO TABS
5.0000 mg | ORAL_TABLET | Freq: Three times a day (TID) | ORAL | Status: DC
  Administered 2022-08-12 (×2): 5 mg via ORAL
  Filled 2022-08-11 (×2): qty 1

## 2022-08-11 MED ORDER — DOCUSATE SODIUM 100 MG PO CAPS: 100.0000 mg | ORAL_CAPSULE | Freq: Every day | ORAL | 0 refills | Status: DC

## 2022-08-11 MED ORDER — ALUM & MAG HYDROXIDE-SIMETH 200-200-20 MG/5ML PO SUSP: 15.0000 mL | ORAL | 0 refills | Status: DC | PRN

## 2022-08-11 MED ORDER — METOPROLOL TARTRATE 25 MG PO TABS: 12.5000 mg | ORAL_TABLET | Freq: Two times a day (BID) | ORAL | Status: DC

## 2022-08-11 MED ORDER — LIDOCAINE 5 % EX PTCH: 1.0000 | MEDICATED_PATCH | CUTANEOUS | 0 refills | Status: DC

## 2022-08-11 MED ORDER — HYDROCODONE-ACETAMINOPHEN 5-325 MG PO TABS: 1.0000 | ORAL_TABLET | ORAL | 0 refills | Status: DC | PRN

## 2022-08-11 MED ORDER — POTASSIUM CHLORIDE CRYS ER 20 MEQ PO TBCR: 20.0000 meq | EXTENDED_RELEASE_TABLET | Freq: Every day | ORAL | Status: DC | PRN

## 2022-08-11 MED ORDER — MORPHINE SULFATE ER 15 MG PO TBCR: 15.0000 mg | EXTENDED_RELEASE_TABLET | Freq: Two times a day (BID) | ORAL | 0 refills | Status: DC

## 2022-08-11 MED ORDER — FUROSEMIDE 40 MG PO TABS: 20.0000 mg | ORAL_TABLET | Freq: Every day | ORAL | Status: DC

## 2022-08-11 MED ORDER — POLYETHYLENE GLYCOL 3350 17 G PO PACK: 17.0000 g | PACK | Freq: Every day | ORAL | 0 refills | Status: DC

## 2022-08-11 MED ORDER — MIDODRINE HCL 5 MG PO TABS: 5.0000 mg | ORAL_TABLET | Freq: Three times a day (TID) | ORAL | Status: DC

## 2022-08-11 MED ORDER — PREGABALIN 150 MG PO CAPS: 150.0000 mg | ORAL_CAPSULE | Freq: Three times a day (TID) | ORAL | 0 refills | Status: DC

## 2022-08-11 NOTE — Progress Notes (Signed)
Pt was to discharge to [facility], unable due to low BP. Manually taken; right arm, lying down was 82/46. MD notified.

## 2022-08-11 NOTE — Discharge Summary (Addendum)
Physician Discharge Summary   Patient: Carla [name] MRN: [id number] DOB: Dec 14, 1960  Admit date:     07/22/2022  Discharge date: 08/12/2022  Discharge Physician: Lorella Nimrod   PCP: [facility]   Recommendations at discharge:  Please obtain CBC and BMP in 1 week Follow-up with vascular surgery in 1 to 2 weeks Follow-up with primary care provider Please check blood pressure before giving pain and blood pressure medications.  Discharge Diagnoses: Principal Problem:   Critical limb ischemia of left lower extremity (HCC) Active Problems:   Hematoma   Acute blood loss anemia   AKI (acute kidney injury) ([place])   PAF (paroxysmal atrial fibrillation) (HCC)   Chronic diastolic CHF (congestive heart failure) (HCC)   Abscess of knee, right   Hospital Course: Carla [name] is a 61 y.o. female with medical history significant for peripheral arterial disease s/p right AKA, chronic diastolic dysfunction CHF, anxiety and depression, COPD, paroxysmal A-fib on chronic anticoagulation therapy, who presents to the ED with left leg pain for the past 1 week which had been progressively worsening.  09/30: CTA found with ischemic LLE. The patient was evaluated by vascular surgeon, Dr. Trula Slade in the ED who will take her to the OR on Sunday 10/1.  Patient started on heparin. Admitted to hospitalist service.  10/01: pt went to OR for common femoral artery bypass graft, post OR woke up c/o leg pain, mottled leg, went back to OR for thrombectomy of Left axillary femoral bypass graft with revision. Transferred to ICU.  10/02: Started on cefepime for concern for abscess of right BKA stump 10/03-10/04: Left upper extremity swelling noted overnight, ultrasound pending as of this AM --> no DVT but (+)hematoma. D/w vascular - no procedures planned for this (expected w/ anticoagulation) and ok to transfer to floor. Pain control has been a challenge but she is better w/ anxiolytics.  10/05: Afib w/  occasional rapid rate and hypotension.  Hemoglobin down to 6.2.  Transfused 2 units PRBC, VS stabilized.  10/06: Hemoglobin 9.0 posttransfusion, 7.5 this morning.  Follow H/H midday today Hgb 7.7, hold off on transfusion for now, CBC in AM. PT recommending SNF. 10/07: concern for thrombosis to toes, vascular following w/ pharmacy for heparin management. Transfusing another unit blood for Hgb 7.0  10/08: continues to c/o pain LUE and RLE. Will image RLE --> CT (+) Low-density fluid collection along the distal aspect of the right femoral amputation. This is nonspecific and could be sterile or infected. No gas or  foreign body is seen associated with this collection. No evidence of osteomyelitis or hip joint effusion. 10/09: Remains on heparin per vascular. Pain control still a challenge 10/11 IV Dilaudid discontinued.  Work with PT and sitting in chair today. 10/12 no overnight issues 10/15 no new issues 10/16 no issues 10/17 snf pending 10/18: Patient continued to experience significant pain, stating that Norco was not working well.  Asking for Dilaudid. Added MS Contin-Will avoid IV pain medications at this time.  Stable for discharge to SNF once bed is available. 10/19: Hemodynamically stable.  Pending disposition, now had a bed offer-pending insurance authorization.  MS Contin helping with her pain. 10/20: Blood pressure little soft which improved with 1 L of IV bolus.  Patient need encouragement to keep with p.o. hydration.  Pain seems improved with MS Contin twice daily and Norco for breakthrough pain.  We discontinued home amlodipine and decrease the dose of metoprolol for softer blood pressure. Obtained insurance authorization for rehab  where she is being discharged for further management. She will continue on current medications and need to have a close follow-up with her providers for further recommendations.  10/21: Patient remained stable after starting her on midodrine 5 mg 3 times a day.   She was supposed to go to rehab yesterday but unable to leave due to softer blood pressure.  Patient should be encouraged to take MS Contin on the regular basis and avoid taking whole lot of oxycodone as pain medications because her blood pressure to become low.  She will continue on midodrine and current regimen of pain medicine and using Norco very judiciously.   Assessment and Plan: * Critical limb ischemia of left lower extremity (HCC) Peripheral artery disease s/p right AKA S/p OR initially 10/01 for Left axillary to common femoral artery bypass graft, Prevena wound VAC placed .  Post surgery patient had left foot pain and mottled leg was taken to the OR again 10/01, thrombectomy of left axillofemoral bypass graft.  Patient was initially managed with heparin infusion and then started on Eliquis. IV pain medications were discontinued. Some improvement in pain by adding MS Contin -Continue MS Contin-dose can be titrated up if needed -Continue with Norco-Will avoid IV pain medications. -Continue with Eliquis -Continue with aspirin and statin. -PT is recommending SNF-medically stable, had a bed offer-pending insurance authorization.  Hematoma Patient developed left upper extremity edema around 10/3 to 10/4, [place] LUE no DVT but there is large (6.6 x 6.3 x 6.5 cm (volume = 140 cm^3)) hematoma under the left axillary surgical site. D/w vascular - no procedures planned for this (expected w/ anticoagulation). Hemoglobin currently stable. -Continue to monitor  Acute blood loss anemia Most likely with hematoma formation after the surgery.  S/p 2 unit of PRBC on 07/27/2022 and 1 unit on 07/29/2022. Hemoglobin currently stable. -Current continue to monitor  AKI (acute kidney injury) ([place]) Resolved. -Monitor renal function periodically. -Avoid nephrotoxins  PAF (paroxysmal atrial fibrillation) (HCC) - Continue metoprolol and Eliquis  Chronic diastolic CHF (congestive heart failure)  (HCC) Clinically compensated Continue metoprolol and Imdur  Abscess of knee, right Patient developed some fluid collection at right AKA site.  CT with low-density fluid collection, nonspecific.  No gas or foreign body seen.  No evidence of osteomyelitis or hip joint infusion. Completed the course of antibiotics. -Continue with wound care   Pain control - [facility] Controlled Substance Reporting System database was reviewed. and patient was instructed, not to drive, operate heavy machinery, perform activities at heights, swimming or participation in water activities or provide baby-sitting services while on Pain, Sleep and Anxiety Medications; until their outpatient Physician has advised to do so again. Also recommended to not to take more than prescribed Pain, Sleep and Anxiety Medications.  Consultants: Vascular surgery Procedures performed: 10/01: Left axillary to common femoral artery bypass graft to treat ischemia 10/01: Thrombectomy of left axillary femoral bypass graft with revision  Disposition: Skilled nursing facility Diet recommendation:  Discharge Diet Orders (From admission, onward)     Start     Ordered   08/11/22 0000  Diet - low sodium heart healthy        08/11/22 1524           Cardiac and Carb modified diet DISCHARGE MEDICATION: Allergies as of 08/11/2022       Reactions   Gabapentin    Tremors    Bactrim [sulfamethoxazole-trimethoprim] Itching        Medication List     STOP  taking these medications    amLODipine 5 MG tablet Commonly known as: NORVASC   omeprazole 20 MG capsule Commonly known as: PRILOSEC   oxyCODONE-acetaminophen 5-325 MG tablet Commonly known as: PERCOCET/ROXICET       TAKE these medications    alum & mag hydroxide-simeth 200-200-20 MG/5ML suspension Commonly known as: MAALOX/MYLANTA Take 15-30 mLs by mouth every 2 (two) hours as needed for indigestion.   apixaban 5 MG Tabs tablet Commonly known as:  ELIQUIS Take 1 tablet (5 mg total) by mouth 2 (two) times daily.   aspirin EC 81 MG tablet Take 81 mg by mouth daily. Swallow whole.   atorvastatin 80 MG tablet Commonly known as: LIPITOR Take 80 mg by mouth at bedtime.   citalopram 40 MG tablet Commonly known as: CeleXA Take 1 tablet (40 mg total) by mouth daily.   Combivent Respimat 20-100 MCG/ACT Aers respimat Generic drug: Ipratropium-Albuterol   docusate sodium 100 MG capsule Commonly known as: COLACE Take 1 capsule (100 mg total) by mouth daily. Start taking on: August 12, 2022   esomeprazole 20 MG capsule Commonly known as: NEXIUM Take 20 mg by mouth daily.   feeding supplement Liqd Take 237 mLs by mouth 2 (two) times daily between meals.   ferrous sulfate 325 (65 FE) MG tablet Take 325 mg by mouth daily.   furosemide 40 MG tablet Commonly known as: LASIX Take 0.5 tablets (20 mg total) by mouth daily. What changed: how much to take   HYDROcodone-acetaminophen 5-325 MG tablet Commonly known as: NORCO/VICODIN Take 1-2 tablets by mouth every 4 (four) hours as needed for severe pain or moderate pain (1 pill for moderate pain, 2 for severe).   isosorbide mononitrate 60 MG 24 hr tablet Commonly known as: IMDUR Take 60 mg by mouth daily.   lidocaine 5 % Commonly known as: LIDODERM Place 1 patch onto the skin daily. Remove & Discard patch within 12 hours or as directed by MD   metoprolol tartrate 25 MG tablet Commonly known as: LOPRESSOR Take 0.5 tablets (12.5 mg total) by mouth 2 (two) times daily. What changed:  medication strength how much to take   midodrine 5 MG tablet Commonly known as: PROAMATINE Take 1 tablet (5 mg total) by mouth 3 (three) times daily with meals. Start taking on: August 12, 2022   morphine 15 MG 12 hr tablet Commonly known as: MS CONTIN Take 1 tablet (15 mg total) by mouth every 12 (twelve) hours.   multivitamin with minerals Tabs tablet Take 1 tablet by mouth daily.    ondansetron 4 MG tablet Commonly known as: ZOFRAN Take 4 mg by mouth every 8 (eight) hours as needed for nausea or vomiting.   polyethylene glycol 17 g packet Commonly known as: MIRALAX / GLYCOLAX Take 17 g by mouth daily. Start taking on: August 12, 2022   potassium chloride SA 20 MEQ tablet Commonly known as: KLOR-CON M Take 1-2 tablets (20-40 mEq total) by mouth daily as needed (low potassium level).   pregabalin 150 MG capsule Commonly known as: LYRICA Take 1 capsule (150 mg total) by mouth 3 (three) times daily. What changed:  medication strength how much to take               Discharge Care Instructions  (From admission, onward)           Start     Ordered   08/11/22 0000  No dressing needed        08/11/22 1524  Contact information for follow-up providers     [facility]. Schedule an appointment as soon as possible for a visit in 1 week(s).   Specialty: General Practice Contact information: [place] [address] [phone]         Serafina Mitchell, MD. Schedule an appointment as soon as possible for a visit in 1 week(s).   Specialties: Vascular Surgery, Cardiology Contact information: [address] [place] [address] [phone]              Contact information for after-discharge care     Destination     [facility] .   Service: Skilled Nursing Contact information: [address] [place] [phone]                    Discharge Exam: Filed Weights   07/22/22 0029  Weight: 80.7 kg   General.  Obese lady, in no acute distress. Pulmonary.  Lungs clear bilaterally, normal respiratory effort. CV.  Regular rate and rhythm, no JVD, rub or murmur. Abdomen.  Soft, nontender, nondistended, BS positive. CNS.  Alert and oriented .  No focal neurologic deficit. Extremities.  Right AKA, trace left lower extremity  edema, pulses intact. Psychiatry.  Judgment and insight appears normal.   Condition at discharge: stable  The results of significant diagnostics from this hospitalization (including imaging, microbiology, ancillary and laboratory) are listed below for reference.   Imaging Studies: CT FEMUR RIGHT W CONTRAST  Result Date: 07/30/2022 CLINICAL DATA:  Soft tissue infection suspected, thigh. Above the knee amputation 04/06/2022. Irrigation of stump 05/03/2022. Thrombectomy of left axillary femoral bypass graft 07/23/2022. EXAM: CT OF THE LOWER RIGHT EXTREMITY WITH CONTRAST TECHNIQUE: Multidetector CT imaging of the lower right extremity was performed according to the standard protocol following intravenous contrast administration. RADIATION DOSE REDUCTION: This exam was performed according to the departmental dose-optimization program which includes automated exposure control, adjustment of the mA and/or kV according to patient size and/or use of iterative reconstruction technique. CONTRAST:  40m OMNIPAQUE IOHEXOL 300 MG/ML  SOLN COMPARISON:  Right femur radiographs 07/30/2022. Abdominal CTA with iliofemoral runoff study 07/22/2022. FINDINGS: Bones/Joint/Cartilage Status post amputation through the distal right femoral diaphysis. The amputation margins are sharp without progressive bone destruction. There is mild demineralization of the distal femur. The proximal femur appears normal. No evidence of femoral head osteonecrosis or significant hip joint effusion. The visualized bony pelvis appears stable with mild sacroiliac degenerative changes and lower lumbar facet arthropathy. Ligaments Suboptimally assessed by CT. Muscles and Tendons Mild generalized muscular atrophy within the right thigh. No intramuscular fluid collection or suspicious enhancement. Soft tissues There is a low-density fluid collection along the distal and of the femoral amputation which measures approximately 4.5 x 2.7 x 2.3 cm. This has a  thin rim of peripheral enhancement. No gas or foreign body is seen associated with this collection. No other focal fluid collections are identified. There are postsurgical changes within the lateral subcutaneous tissues of the distal right thigh. The right femoral artery is attenuated, but patent within the thigh. Small caliber right external iliac artery appears grossly patent. There are common iliac artery stents bilaterally which are not optimally evaluated. A broad-based ventral abdominal wall hernia contains multiple small and large bowel loops as before, incompletely visualized. IMPRESSION: 1. Low-density fluid collection along the distal aspect of the right femoral amputation. This is nonspecific and could be sterile or infected. No gas or  foreign body is seen associated with this collection. 2. No evidence of osteomyelitis or hip joint effusion. 3. Grossly patent right femoral and external iliac arteries. 4. Broad-based ventral abdominal wall hernia containing multiple small and large bowel loops, incompletely visualized. Electronically Signed   By: Richardean Sale M.D.   On: 07/30/2022 18:04   DG FEMUR PORT, MIN 2 VIEWS RIGHT  Result Date: 07/30/2022 CLINICAL DATA:  Prior above the knee amputation.  Stump pain. EXAM: RIGHT FEMUR PORTABLE 2 VIEW COMPARISON:  None Available. FINDINGS: Amputation is seen in the mid femoral diaphysis. No evidence of osteolysis or acute periostitis. No other bone lesions identified. Soft tissues are unremarkable. IMPRESSION: Amputation at the mid femoral diaphysis.  No acute findings. Electronically Signed   By: Marlaine Hind M.D.   On: 07/30/2022 16:13   US Venous Img Upper Uni Left (DVT)  Result Date: 07/26/2022 CLINICAL DATA:  Left upper extremity swelling status post axillary to femoral bypass graft EXAM: LEFT UPPER EXTREMITY VENOUS DOPPLER ULTRASOUND TECHNIQUE: Gray-scale sonography with graded compression, as well as color Doppler and duplex ultrasound were  performed to evaluate the upper extremity deep venous system from the level of the subclavian vein and including the jugular, axillary, basilic, radial, ulnar and upper cephalic vein. Spectral Doppler was utilized to evaluate flow at rest and with distal augmentation maneuvers. COMPARISON:  None Available. FINDINGS: Contralateral Subclavian Vein: Respiratory phasicity is normal and symmetric with the symptomatic side. No evidence of thrombus. Normal compressibility. Internal Jugular Vein: No evidence of thrombus. Normal compressibility, respiratory phasicity and response to augmentation. Subclavian Vein: No evidence of thrombus. Normal compressibility, respiratory phasicity and response to augmentation. Axillary Vein: No evidence of thrombus. Normal compressibility, respiratory phasicity and response to augmentation. Cephalic Vein: No evidence of thrombus. Normal compressibility, respiratory phasicity and response to augmentation. Basilic Vein: No evidence of thrombus. Normal compressibility, respiratory phasicity and response to augmentation. Brachial Veins: No evidence of thrombus. Normal compressibility, respiratory phasicity and response to augmentation. Radial Veins: No evidence of thrombus. Normal compressibility, respiratory phasicity and response to augmentation. Ulnar Veins: No evidence of thrombus. Normal compressibility, respiratory phasicity and response to augmentation. Venous Reflux:  None visualized. Other Findings: Heterogeneous 6 stick collection in the region of the surgical site measures approximately 6.6 x 6.3 x 6.5 cm. No evidence of internal vascularity. Findings are most consistent with hematoma. IMPRESSION: No evidence of DVT within the left upper extremity. Large (6.6 x 6.3 x 6.5 cm (volume = 140 cm^3)) hematoma under the left axillary surgical site. Electronically Signed   By: Jacqulynn Cadet M.D.   On: 07/26/2022 13:17   CT ANGIO AO+BIFEM W & OR WO CONTRAST  Result Date:  07/22/2022 CLINICAL DATA:  Arterial embolism lower extremity EXAM: CT ANGIOGRAPHY OF ABDOMINAL AORTA WITH ILIOFEMORAL RUNOFF TECHNIQUE: Multidetector CT imaging of the abdomen, pelvis and lower extremities was performed using the standard protocol during bolus administration of intravenous contrast. Multiplanar CT image reconstructions and MIPs were obtained to evaluate the vascular anatomy. RADIATION DOSE REDUCTION: This exam was performed according to the departmental dose-optimization program which includes automated exposure control, adjustment of the mA and/or kV according to patient size and/or use of iterative reconstruction technique. CONTRAST:  152m OMNIPAQUE IOHEXOL 350 MG/ML SOLN COMPARISON:  None Available. CT abdomen and pelvis 03/21/2022 FINDINGS: VASCULAR Aorta: Aorto bi iliac stent is occluded. The aorta is otherwise normal in caliber. Celiac: Patent without dissection or aneurysm. SMA: The SMA stent is occluded at the origin. With distal reconstitution. Renals:  Patent bilateral renal arteries without aneurysm or dissection. IMA: Not visualized and likely occluded. RIGHT Lower Extremity Inflow: The right common femoral artery stent is occluded with thrombus extending into the right external iliac artery. The right internal iliac artery is likely occluded at its origin with distal reconstitution. Reconstitution of the distal external iliac artery. Outflow: The common femoral, profunda femoral, and superficial femoral arteries are patent and likely supplied from collateral flow. Runoff: Not applicable.  Right above the knee amputation. LEFT Lower Extremity Inflow: The common iliac stent external iliac, and proximal internal iliac are occluded. Distal reconstitution of the internal iliac. Outflow: Reconstitution of the common femoral artery. The common femoral, profunda femoral, superficial femoral, and popliteal arteries are patent. Runoff: Three-vessel runoff is patent to a 12-13 cm above the ankle  in the calf. At this point the vessels are nonopacified possibly due to slow flow though occlusion is not excluded. Veins: No obvious venous abnormality within the limitations of this arterial phase study. Review of the MIP images confirms the above findings. NON-VASCULAR Lower chest: No acute abnormality. Hepatobiliary: Cholecystectomy. No suspicious focal hepatic lesion. No biliary dilation. Pancreas: Unremarkable. Spleen: Unremarkable. Adrenals/Urinary Tract: Adrenal glands are unremarkable. Kidneys are normal, without renal calculi, focal lesion, or hydronephrosis. Bladder is unremarkable. Stomach/Bowel: Distended stomach. Normal caliber large and small bowel. Colonic diverticulosis without diverticulitis. Herniation of small and large bowel into the large ventral abdominal wall hernia. No evidence of obstruction or strangulation. Bowel anastomosis is present within the hernia sac. Lymphatic: No suspicious adenopathy. Reproductive: Hysterectomy.  No adnexal masses. Other: No free intraperitoneal fluid or gas. Musculoskeletal: Within the distal right thigh at the site of amputation there is a 5.1 x 4.0 cm fluid collection with peripheral enhancement. Edema within the left foot. No acute osseous abnormality. 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 Patent three-vessel runoff of the left lower extremity to the level of the calf where there is non opacification of the distal lower extremity arteries. This may be due to slow flow however occlusion is not excluded. Occluded SMA stent at the origin with distal reconstitution. NON-VASCULAR Right above-the-knee amputation. At the site of amputation there is a fluid collection concerning for abscess. Large ventral abdominal wall hernia containing nonobstructive loops of bowel. Emergent results were called by telephone at the time of interpretation on 07/22/2022 at 1:14 am to  provider JADE SUNG , who verbally acknowledged these results. Electronically Signed   By: Placido Sou M.D.   On: 07/22/2022 01:27    Microbiology: Results for orders placed or performed during the hospital encounter of 07/22/22  MRSA Next Gen by PCR, Nasal     Status: Abnormal   Collection Time: 07/23/22 12:17 AM   Specimen: Nasal Mucosa; Nasal Swab  Result Value Ref Range Status   MRSA by PCR Next Gen DETECTED (A) NOT DETECTED Final    Comment: RESULT CALLED TO, READ BACK BY AND VERIFIED WITH: Clide Cliff @ [phone] on 07/23/2022 by CAF (NOTE) The GeneXpert MRSA Assay (FDA approved for NASAL specimens only), is one component of a comprehensive MRSA colonization surveillance program. It is not intended to diagnose MRSA infection nor to guide or monitor treatment for MRSA infections. Test performance is not FDA approved in patients less than 16 years old. Performed at [facility], [address] [place], [place] [address]     Labs: CBC: Recent Labs  Lab 08/09/22 0329  WBC 6.3  HGB  8.1*  HCT 26.9*  MCV 96.1  PLT 503   Basic Metabolic Panel: Recent Labs  Lab 08/09/22 0329  NA 140  K 4.1  CL 103  CO2 28  GLUCOSE 147*  BUN 16  CREATININE 0.56  CALCIUM 8.5*   Liver Function Tests: No results for input(s): "AST", "ALT", "ALKPHOS", "BILITOT", "PROT", "ALBUMIN" in the last 168 hours. CBG: Recent Labs  Lab 08/10/22 2342 08/11/22 0419 08/11/22 0725 08/11/22 1149 08/11/22 1622  GLUCAP 118* 119* 122* 136* 119*    Discharge time spent: greater than 30 minutes.  .  PlaThis record has been created using Systems analyst. Errors have been sought and corrected,but may not always be located. Such creation errors do not reflect on the standard of care.   Signed: Lorella Nimrod, MD [facility] 08/11/2022

## 2022-08-11 NOTE — Plan of Care (Signed)
  Problem: Education: Goal: Knowledge of General Education information will improve Description: Including pain rating scale, medication(s)/side effects and non-pharmacologic comfort measures Outcome: Progressing   Problem: Health Behavior/Discharge Planning: Goal: Ability to manage health-related needs will improve Outcome: Progressing   Problem: Clinical Measurements: Goal: Ability to maintain clinical measurements within normal limits will improve Outcome: Progressing Goal: Will remain free from infection Outcome: Progressing Goal: Diagnostic test results will improve Outcome: Progressing Goal: Respiratory complications will improve Outcome: Progressing Goal: Cardiovascular complication will be avoided Outcome: Progressing   Problem: Activity: Goal: Risk for activity intolerance will decrease Outcome: Progressing   Problem: Nutrition: Goal: Adequate nutrition will be maintained Outcome: Progressing   Problem: Coping: Goal: Level of anxiety will decrease Outcome: Progressing   Problem: Elimination: Goal: Will not experience complications related to bowel motility Outcome: Progressing Goal: Will not experience complications related to urinary retention Outcome: Progressing   Problem: Pain Managment: Goal: General experience of comfort will improve Outcome: Progressing   Problem: Safety: Goal: Ability to remain free from injury will improve Outcome: Progressing   Problem: Skin Integrity: Goal: Risk for impaired skin integrity will decrease Outcome: Progressing   Problem: Education: Goal: Knowledge of prescribed regimen will improve Outcome: Progressing   Problem: Activity: Goal: Ability to tolerate increased activity will improve Outcome: Progressing   Problem: Bowel/Gastric: Goal: Gastrointestinal status for postoperative course will improve Outcome: Progressing   Problem: Clinical Measurements: Goal: Postoperative complications will be avoided or  minimized Outcome: Progressing Goal: Signs and symptoms of graft occlusion will improve Outcome: Progressing   Problem: Skin Integrity: Goal: Demonstration of wound healing without infection will improve Outcome: Progressing   Problem: Education: Goal: Ability to describe self-care measures that may prevent or decrease complications (Diabetes Survival Skills Education) will improve Outcome: Progressing Goal: Individualized Educational Video(s) Outcome: Progressing   Problem: Coping: Goal: Ability to adjust to condition or change in health will improve Outcome: Progressing   Problem: Fluid Volume: Goal: Ability to maintain a balanced intake and output will improve Outcome: Progressing   Problem: Health Behavior/Discharge Planning: Goal: Ability to identify and utilize available resources and services will improve Outcome: Progressing Goal: Ability to manage health-related needs will improve Outcome: Progressing   Problem: Metabolic: Goal: Ability to maintain appropriate glucose levels will improve Outcome: Progressing   Problem: Nutritional: Goal: Maintenance of adequate nutrition will improve Outcome: Progressing Goal: Progress toward achieving an optimal weight will improve Outcome: Progressing   Problem: Skin Integrity: Goal: Risk for impaired skin integrity will decrease Outcome: Progressing   Problem: Tissue Perfusion: Goal: Adequacy of tissue perfusion will improve Outcome: Progressing

## 2022-08-11 NOTE — TOC Progression Note (Addendum)
Transition of Care [facility]) - Progression Note    Patient Details  Name: Carla [name] MRN: [id number] Date of Birth: [birth year]  Transition of Care [facility]) CM/SW [place], RN Phone Number: 08/11/2022, 3:12 PM  Clinical Narrative:     Attempted to reach the patients daughter [name] , unable to reach,  Reached out to her other daughter Douglass Rivers unable to reach, She was facetiming family and will tell them that she will go to [facility] room E9  Expected Discharge Plan: [place] Barriers to Discharge: Continued Medical Work up  Expected Discharge Plan and Services Expected Discharge Plan: [place]   Discharge Planning Services: CM Consult   Living arrangements for the past 2 months: Single Family Home                 DME Arranged: N/A DME Agency: NA                   Social Determinants of Health (SDOH) Interventions    Readmission Risk Interventions    07/25/2022   10:49 AM 05/05/2022   10:52 AM 03/24/2022    9:51 AM  Readmission Risk Prevention Plan  Transportation Screening Complete Complete Complete  PCP or Specialist Appt within 3-5 Days   Complete  HRI or Home Care Consult   Complete  Social Work Consult for [place] Planning/Counseling   Complete  Palliative Care Screening   Not Applicable  Medication Review Press photographer) Complete Complete Complete  PCP or Specialist appointment within 3-5 days of discharge Complete Complete   HRI or Home Care Consult Complete    SW Recovery Care/Counseling Consult Not Complete Complete   SW Consult Not Complete Comments NA    Palliative Care Screening  Not [place] Patient Refused Patient Refused

## 2022-08-11 NOTE — Progress Notes (Signed)
Occupational Therapy Treatment Patient Details Name: NOELA BROTHERS MRN: [id number] DOB: 05/27/1961 Today's Date: 08/11/2022   History of present illness Pt is a 61 year old female s/p OR initially for Left axillary to common femoral artery bypass graft, Prevena wound VAC placed .  Post surgery patient had left foot pain and mottled leg was taken to the OR again , now status post thrombectomy of left axillofemoral bypass graft after presentingto the ED with progressively worsening left leg pain; PMH significant for arterial disease s/p right AKA, chronic diastolic dysfunction CHF, anxiety and depression, COPD, paroxysmal A-fib on chronic anticoagulation therapy   OT comments  Chart reviewed, pt greeted in bed, reporting feeling tired, pt is tearful regarding current functional status. Tx session targeted improving functional mobility tolerance in preparation for ADL task completion. Pt encouraged to transfer to chair, however unable to receive pain meds this morning due to low BP. Vital signs motioned throughout. No c/o dizziness. Pt tolerated static sitting on edge of bed for 12-15 minutes, grooming tasks in sitting with SET UP. Encouraged pt to continue to mobilize as often as possible. Pt is left as received, all neesd met. OT will follow acutely.   BP in supine: 91/52 BP in bed with HOB raised: 95/58 (MAP 68) HR 87  BP sitting on edge of bed: 123/78 (MAP 89) HR 94    Recommendations for follow up therapy are one component of a multi-disciplinary discharge planning process, led by the attending physician.  Recommendations may be updated based on patient status, additional functional criteria and insurance authorization.    Follow Up Recommendations  Skilled nursing-short term rehab (<3 hours/day)    Assistance Recommended at Discharge Frequent or constant Supervision/Assistance  Patient can return home with the following  Two people to help with walking and/or transfers;Two people to help  with bathing/dressing/bathroom   Equipment Recommendations  BSC/3in1    Recommendations for Other Services      Precautions / Restrictions Precautions Precautions: Fall Restrictions Weight Bearing Restrictions: No       Mobility Bed Mobility Overal bed mobility: Needs Assistance Bed Mobility: Supine to Sit, Sit to Supine     Supine to sit: Mod assist Sit to supine: Mod assist   General bed mobility comments: assist for LLE    Transfers                   General transfer comment: deferred on this date due to pain levels, low BP     Balance Overall balance assessment: Needs assistance Sitting-balance support: Bilateral upper extremity supported, Feet supported Sitting balance-[name]: Fair Sitting balance - Comments: static sitting on edge of bed for approx 12-15 minutes                                   ADL either performed or assessed with clinical judgement   ADL Overall ADL's : Needs assistance/impaired     Grooming: Set up;Sitting;Wash/dry face               Lower Body Dressing: Maximal assistance                      Extremity/Trunk Assessment              Vision       Perception     Praxis      Cognition Arousal/Alertness: Awake/alert Behavior During Therapy: [facility]  for tasks assessed/performed, Anxious (tearful) Overall Cognitive Status: Within Functional Limits for tasks assessed Area of Impairment: Safety/judgement                             Problem Solving: Requires verbal cues, Requires tactile cues          Exercises      Shoulder Instructions       General Comments      Pertinent Vitals/ Pain       Pain Assessment Pain Assessment: Faces Faces Pain Scale: Hurts whole lot Pain Location: L shoulder Pain Descriptors / Indicators: Discomfort, Grimacing Pain Intervention(s): Limited activity within patient's tolerance, Monitored during session, Repositioned  Home Living                                           Prior Functioning/Environment              Frequency  Min 2X/week        Progress Toward Goals  OT Goals(current goals can now be found in the care plan section)  Progress towards OT goals: Progressing toward goals     Plan Discharge plan remains appropriate;Frequency remains appropriate    Co-evaluation                 AM-PAC OT "6 Clicks" Daily Activity     Outcome Measure   Help from another person eating meals?: A Little Help from another person taking care of personal grooming?: A Little Help from another person toileting, which includes using toliet, bedpan, or urinal?: A Lot Help from another person bathing (including washing, rinsing, drying)?: A Lot Help from another person to put on and taking off regular upper body clothing?: A Lot Help from another person to put on and taking off regular lower body clothing?: A Lot 6 Click Score: 14    End of Session    OT Visit Diagnosis: Muscle weakness (generalized) (M62.81);Unsteadiness on feet (R26.81)   Activity Tolerance Patient limited by pain   Patient Left in bed;with call bell/phone within reach;with bed alarm set   Nurse Communication Mobility status (vital signs)        Time: 4270-6237 OT Time Calculation (min): 22 min  Charges: OT General Charges $OT Visit: 1 Visit OT Treatments $Therapeutic Activity: 8-22 mins Shanon Payor, OTD OTR/L  08/11/22, 12:34 PM

## 2022-08-11 NOTE — TOC Progression Note (Signed)
Transition of Care [facility]) - Progression Note    Patient Details  Name: Carla [name] MRN: [id number] Date of Birth: 09/19/1961  Transition of Care [facility]) CM/SW [place], RN Phone Number: 08/11/2022, 8:57 AM  Clinical Narrative:     [employer] it is pending, Plan ID is [id number], Will check again later to see if approved  Expected Discharge Plan: [place] Barriers to Discharge: Continued Medical Work up  Expected Discharge Plan and Services Expected Discharge Plan: [place]   Discharge Planning Services: CM Consult   Living arrangements for the past 2 months: Single Family Home                 DME Arranged: N/A DME Agency: NA                   Social Determinants of Health (SDOH) Interventions    Readmission Risk Interventions    07/25/2022   10:49 AM 05/05/2022   10:52 AM 03/24/2022    9:51 AM  Readmission Risk Prevention Plan  Transportation Screening Complete Complete Complete  PCP or Specialist Appt within 3-5 Days   Complete  HRI or Home Care Consult   Complete  Social Work Consult for [place] Planning/Counseling   Complete  Palliative Care Screening   Not Applicable  Medication Review Press photographer) Complete Complete Complete  PCP or Specialist appointment within 3-5 days of discharge Complete Complete   HRI or Home Care Consult Complete    SW Recovery Care/Counseling Consult Not Complete Complete   SW Consult Not Complete Comments NA    Palliative Care Screening  Not [place] Patient Refused Patient Refused

## 2022-08-11 NOTE — TOC Progression Note (Signed)
Transition of Care [facility]) - Progression Note    Patient Details  Name: LACORA FOLMER MRN: [id number] Date of Birth: 03/03/1961  Transition of Care [facility]) CM/SW [place], RN Phone Number: 08/11/2022, 3:06 PM  Clinical Narrative:    Ins auth approved to go to [place], [id number]   Expected Discharge Plan: [place] Barriers to Discharge: Continued Medical Work up  Expected Discharge Plan and Services Expected Discharge Plan: [place]   Discharge Planning Services: CM Consult   Living arrangements for the past 2 months: Single Family Home                 DME Arranged: N/A DME Agency: NA                   Social Determinants of Health (SDOH) Interventions    Readmission Risk Interventions    07/25/2022   10:49 AM 05/05/2022   10:52 AM 03/24/2022    9:51 AM  Readmission Risk Prevention Plan  Transportation Screening Complete Complete Complete  PCP or Specialist Appt within 3-5 Days   Complete  HRI or [address]   Complete  Social Work Consult for [place] Planning/Counseling   Complete  Palliative Care Screening   Not Applicable  Medication Review Press photographer) Complete Complete Complete  PCP or Specialist appointment within 3-5 days of discharge Complete Complete   HRI or Home Care Consult Complete    SW Recovery Care/Counseling Consult Not Complete Complete   SW Consult Not Complete Comments NA    Palliative Care Screening  Not [place] Patient Refused Patient Refused

## 2022-08-12 LAB — GLUCOSE, CAPILLARY
Glucose-Capillary: 101 mg/dL — ABNORMAL HIGH (ref 70–99)
Glucose-Capillary: 119 mg/dL — ABNORMAL HIGH (ref 70–99)
Glucose-Capillary: 129 mg/dL — ABNORMAL HIGH (ref 70–99)
Glucose-Capillary: 92 mg/dL (ref 70–99)

## 2022-08-12 NOTE — Plan of Care (Signed)
  Problem: Education: Goal: Knowledge of General Education information will improve Description: Including pain rating scale, medication(s)/side effects and non-pharmacologic comfort measures Outcome: Progressing   Problem: Health Behavior/Discharge Planning: Goal: Ability to manage health-related needs will improve Outcome: Progressing   Problem: Clinical Measurements: Goal: Ability to maintain clinical measurements within normal limits will improve Outcome: Progressing Goal: Will remain free from infection Outcome: Progressing Goal: Diagnostic test results will improve Outcome: Progressing Goal: Respiratory complications will improve Outcome: Progressing Goal: Cardiovascular complication will be avoided Outcome: Progressing   Problem: Activity: Goal: Risk for activity intolerance will decrease Outcome: Progressing   Problem: Nutrition: Goal: Adequate nutrition will be maintained Outcome: Progressing   Problem: Coping: Goal: Level of anxiety will decrease Outcome: Progressing   Problem: Elimination: Goal: Will not experience complications related to bowel motility Outcome: Progressing Goal: Will not experience complications related to urinary retention Outcome: Progressing   Problem: Pain Managment: Goal: General experience of comfort will improve Outcome: Progressing   Problem: Safety: Goal: Ability to remain free from injury will improve Outcome: Progressing   Problem: Skin Integrity: Goal: Risk for impaired skin integrity will decrease Outcome: Progressing   Problem: Education: Goal: Knowledge of prescribed regimen will improve Outcome: Progressing   Problem: Activity: Goal: Ability to tolerate increased activity will improve Outcome: Progressing   Problem: Bowel/Gastric: Goal: Gastrointestinal status for postoperative course will improve Outcome: Progressing   Problem: Clinical Measurements: Goal: Postoperative complications will be avoided or  minimized Outcome: Progressing Goal: Signs and symptoms of graft occlusion will improve Outcome: Progressing   Problem: Skin Integrity: Goal: Demonstration of wound healing without infection will improve Outcome: Progressing   Problem: Education: Goal: Ability to describe self-care measures that may prevent or decrease complications (Diabetes Survival Skills Education) will improve Outcome: Progressing Goal: Individualized Educational Video(s) Outcome: Progressing   Problem: Coping: Goal: Ability to adjust to condition or change in health will improve Outcome: Progressing   Problem: Fluid Volume: Goal: Ability to maintain a balanced intake and output will improve Outcome: Progressing   Problem: Health Behavior/Discharge Planning: Goal: Ability to identify and utilize available resources and services will improve Outcome: Progressing Goal: Ability to manage health-related needs will improve Outcome: Progressing   Problem: Metabolic: Goal: Ability to maintain appropriate glucose levels will improve Outcome: Progressing   Problem: Nutritional: Goal: Maintenance of adequate nutrition will improve Outcome: Progressing Goal: Progress toward achieving an optimal weight will improve Outcome: Progressing   Problem: Skin Integrity: Goal: Risk for impaired skin integrity will decrease Outcome: Progressing   Problem: Tissue Perfusion: Goal: Adequacy of tissue perfusion will improve Outcome: Progressing

## 2022-08-12 NOTE — TOC Transition Note (Signed)
Transition of Care [facility]) - CM/SW Discharge Note   Patient Details  Name: AMAR KEENUM MRN: [id number] Date of Birth: 08-29-1961  Transition of Care [facility]) CM/SW Contact:  [address], [place] Phone Number: 08/12/2022, 12:00 PM   Clinical Narrative:     [facility] to: [facility]  Anticipated DC date: 08/12/22 Family notified:left vm for daughter Transport by: [facility]   Per MD patient ready for DC to [facility]. RN to call report prior to discharge [phone], ask for rehab hall. RN, patient, patient's family, and facility notified of DC. Discharge Summary and FL2 sent to facility. DC packet on chart. Ambulance transport requested for patient.   CSW will sign off for now as social work intervention is no longer needed. Please consult [place] again if new needs arise.    Final next level of care: Skilled Nursing Facility Barriers to Discharge: Barriers Resolved   Patient Goals and CMS Choice Patient states their goals for this hospitalization and ongoing recovery are:: Patient does not want to go to SNF, wants to go home CMS Medicare.gov Compare Post Acute Care list provided to:: Patient Represenative (must comment) Choice offered to / list presented to : Adult Children, Patient  Discharge Placement              Patient chooses bed at: [facility] Patient to be transferred to facility by: [facility] Name of family member notified: Called daughter, left vm requesting call back Patient and family notified of of transfer: 08/12/22  Discharge Plan and Services   Discharge Planning Services: CM Consult            DME Arranged: N/A DME Agency: NA                  Social Determinants of Health ([place]) Interventions     Readmission Risk Interventions    07/25/2022   10:49 AM 05/05/2022   10:52 AM 03/24/2022    9:51 AM  Readmission Risk Prevention Plan  Transportation Screening Complete Complete Complete  PCP or Specialist  Appt within 3-5 Days   Complete  HRI or [place]   Complete  Social Work Consult for [place] Planning/Counseling   Complete  Palliative Care Screening   Not Applicable  Medication Review Press photographer) Complete Complete Complete  PCP or Specialist appointment within 3-5 days of discharge Complete Complete   HRI or Home Care Consult Complete    SW Recovery Care/Counseling Consult Not Complete Complete   SW Consult Not Complete Comments NA    Palliative Care Screening  Not [place] Patient Refused Patient Refused

## 2022-08-12 NOTE — Progress Notes (Signed)
EMS transported pt from [facility] to [facility].  

## 2022-08-12 NOTE — Plan of Care (Signed)
Problem: Education: Goal: Knowledge of General Education information will improve Description: Including pain rating scale, medication(s)/side effects and non-pharmacologic comfort measures 08/12/2022 1250 by Moise Friday Bet, LPN Outcome: Adequate for Discharge 08/12/2022 1205 by Jams Trickett Bet, LPN Outcome: Progressing   Problem: Health Behavior/Discharge Planning: Goal: Ability to manage health-related needs will improve 08/12/2022 1250 by Zigmund Linse Bet, LPN Outcome: Adequate for Discharge 08/12/2022 1205 by Danissa Rundle Bet, LPN Outcome: Progressing   Problem: Clinical Measurements: Goal: Ability to maintain clinical measurements within normal limits will improve 08/12/2022 1250 by Angeldejesus Callaham Bet, LPN Outcome: Adequate for Discharge 08/12/2022 1205 by Brenen Beigel Bet, LPN Outcome: Progressing Goal: Will remain free from infection 08/12/2022 1250 by Gustav Knueppel Bet, LPN Outcome: Adequate for Discharge 08/12/2022 1205 by Tashena Ibach Bet, LPN Outcome: Progressing Goal: Diagnostic test results will improve 08/12/2022 1250 by Camarion Weier Bet, LPN Outcome: Adequate for Discharge 08/12/2022 1205 by Luanna Weesner Bet, LPN Outcome: Progressing Goal: Respiratory complications will improve 08/12/2022 1250 by Bethlehem Langstaff Bet, LPN Outcome: Adequate for Discharge 08/12/2022 1205 by Buford Gayler Bet, LPN Outcome: Progressing Goal: Cardiovascular complication will be avoided 08/12/2022 1250 by Jaran Sainz Bet, LPN Outcome: Adequate for Discharge 08/12/2022 1205 by Jesseka Drinkard Bet, LPN Outcome: Progressing   Problem: Activity: Goal: Risk for activity intolerance will decrease 08/12/2022 1250 by Crystelle Ferrufino Bet, LPN Outcome: Adequate for Discharge 08/12/2022 1205 by Tarrie Mcmichen Bet, LPN Outcome: Progressing   Problem: Nutrition: Goal: Adequate nutrition will be maintained 08/12/2022 1250 by Margurite Duffy Bet, LPN Outcome: Adequate for Discharge 08/12/2022 1205 by Ahmya Bernick Bet, LPN Outcome:  Progressing   Problem: Coping: Goal: Level of anxiety will decrease 08/12/2022 1250 by Adya Wirz Bet, LPN Outcome: Adequate for Discharge 08/12/2022 1205 by Anikah Hogge Bet, LPN Outcome: Progressing   Problem: Elimination: Goal: Will not experience complications related to bowel motility 08/12/2022 1250 by Galaxy Borden Bet, LPN Outcome: Adequate for Discharge 08/12/2022 1205 by Dalissa Lovin Bet, LPN Outcome: Progressing Goal: Will not experience complications related to urinary retention 08/12/2022 1250 by Kaj Vasil Bet, LPN Outcome: Adequate for Discharge 08/12/2022 1205 by Joden Bonsall Bet, LPN Outcome: Progressing   Problem: Pain Managment: Goal: General experience of comfort will improve 08/12/2022 1250 by Milton Paone Bet, LPN Outcome: Adequate for Discharge 08/12/2022 1205 by Arlyne Brandes Bet, LPN Outcome: Progressing   Problem: Safety: Goal: Ability to remain free from injury will improve 08/12/2022 1250 by Riki Gehring Bet, LPN Outcome: Adequate for Discharge 08/12/2022 1205 by Kolyn Rozario Bet, LPN Outcome: Progressing   Problem: Skin Integrity: Goal: Risk for impaired skin integrity will decrease 08/12/2022 1250 by Coreena Rubalcava Bet, LPN Outcome: Adequate for Discharge 08/12/2022 1205 by Vennela Jutte Bet, LPN Outcome: Progressing   Problem: Education: Goal: Knowledge of prescribed regimen will improve 08/12/2022 1250 by Latonga Ponder Bet, LPN Outcome: Adequate for Discharge 08/12/2022 1205 by Shoni Quijas Bet, LPN Outcome: Progressing   Problem: Activity: Goal: Ability to tolerate increased activity will improve 08/12/2022 1250 by Camella Seim Bet, LPN Outcome: Adequate for Discharge 08/12/2022 1205 by Sherrol Vicars Bet, LPN Outcome: Progressing   Problem: Bowel/Gastric: Goal: Gastrointestinal status for postoperative course will improve 08/12/2022 1250 by Theran Vandergrift Bet, LPN Outcome: Adequate for Discharge 08/12/2022 1205 by Yazeed Pryer Bet, LPN Outcome: Progressing    Problem: Clinical Measurements: Goal: Postoperative complications will be avoided or minimized 08/12/2022 1250 by Maddyx Vallie Bet, LPN Outcome: Adequate for Discharge 08/12/2022 1205 by Aryanne Gilleland Bet, LPN Outcome: Progressing Goal: Signs and symptoms of  graft occlusion will improve 08/12/2022 1250 by Maxine Fredman Bet, LPN Outcome: Adequate for Discharge 08/12/2022 1205 by Athelene Hursey Bet, LPN Outcome: Progressing   Problem: Skin Integrity: Goal: Demonstration of wound healing without infection will improve 08/12/2022 1250 by Marrion Finan Bet, LPN Outcome: Adequate for Discharge 08/12/2022 1205 by Tasharra Nodine Bet, LPN Outcome: Progressing   Problem: Education: Goal: Ability to describe self-care measures that may prevent or decrease complications (Diabetes Survival Skills Education) will improve 08/12/2022 1250 by Edoardo Laforte Bet, LPN Outcome: Adequate for Discharge 08/12/2022 1205 by Preesha Benjamin Bet, LPN Outcome: Progressing Goal: Individualized Educational Video(s) 08/12/2022 1250 by Aeralyn Barna Bet, LPN Outcome: Adequate for Discharge 08/12/2022 1205 by Vandy Tsuchiya Bet, LPN Outcome: Progressing   Problem: Coping: Goal: Ability to adjust to condition or change in health will improve 08/12/2022 1250 by Mateja Dier Bet, LPN Outcome: Adequate for Discharge 08/12/2022 1205 by Anayelli Lai Bet, LPN Outcome: Progressing   Problem: Fluid Volume: Goal: Ability to maintain a balanced intake and output will improve 08/12/2022 1250 by Ifeoma Vallin Bet, LPN Outcome: Adequate for Discharge 08/12/2022 1205 by Ashleymarie Granderson Bet, LPN Outcome: Progressing   Problem: Health Behavior/Discharge Planning: Goal: Ability to identify and utilize available resources and services will improve 08/12/2022 1250 by Joud Pettinato, Neftaly Swiss Bet, LPN Outcome: Adequate for Discharge 08/12/2022 1205 by Sandor Arboleda Bet, LPN Outcome: Progressing Goal: Ability to manage health-related needs will improve 08/12/2022 1250 by Avis Tirone,  [place] Sarinana Bet, LPN Outcome: Adequate for Discharge 08/12/2022 1205 by Silus Lanzo Bet, LPN Outcome: Progressing   Problem: Metabolic: Goal: Ability to maintain appropriate glucose levels will improve 08/12/2022 1250 by Brave Dack Bet, LPN Outcome: Adequate for Discharge 08/12/2022 1205 by Foch Rosenwald Bet, LPN Outcome: Progressing   Problem: Nutritional: Goal: Maintenance of adequate nutrition will improve 08/12/2022 1250 by Ginnette Gates Bet, LPN Outcome: Adequate for Discharge 08/12/2022 1205 by Keylon Labelle Bet, LPN Outcome: Progressing Goal: Progress toward achieving an optimal weight will improve 08/12/2022 1250 by Myesha Stillion Bet, LPN Outcome: Adequate for Discharge 08/12/2022 1205 by Aquil Duhe Bet, LPN Outcome: Progressing   Problem: Skin Integrity: Goal: Risk for impaired skin integrity will decrease 08/12/2022 1250 by Griffey Nicasio Bet, LPN Outcome: Adequate for Discharge 08/12/2022 1205 by Malak Duchesneau Bet, LPN Outcome: Progressing   Problem: Tissue Perfusion: Goal: Adequacy of tissue perfusion will improve 08/12/2022 1250 by Aaminah Forrester Bet, LPN Outcome: Adequate for Discharge 08/12/2022 1205 by Venise Ellingwood Bet, LPN Outcome: Progressing   Problem: Acute Rehab PT Goals(only PT should resolve) Goal: Pt Will Go Supine/Side To Sit Outcome: Adequate for Discharge Goal: Pt Will Transfer Bed To Chair/Chair To Bed Outcome: Adequate for Discharge Goal: Pt/caregiver will Perform Home Exercise Program Outcome: Adequate for Discharge Goal: PT Additional Goal #1 Outcome: Adequate for Discharge   Problem: Acute Rehab OT Goals (only OT should resolve) Goal: Pt. Will Perform Grooming Outcome: Adequate for Discharge Goal: Pt. Will Perform Lower Body Dressing Outcome: Adequate for Discharge Goal: Pt. Will Transfer To Toilet Outcome: Adequate for Discharge Goal: Pt. Will Perform Toileting-Clothing Manipulation Outcome: Adequate for Discharge

## 2022-08-12 NOTE — Progress Notes (Signed)
Contacted [facility] and reported to nurse Clarene Critchley.

## 2022-08-12 NOTE — Progress Notes (Signed)
Patient was unable to leave yesterday for rehab due to softer blood pressure.  Blood pressure improved after starting her on low-dose midodrine.  Patient is using quite a bit of pain medications resulted in softer blood pressure. Blood pressure stable and she appears very comfortable this morning when seen. No new concern and she is stable for discharge to rehab.

## 2022-08-17 ENCOUNTER — Telehealth (INDEPENDENT_AMBULATORY_CARE_PROVIDER_SITE_OTHER): Payer: Self-pay

## 2022-08-26 ENCOUNTER — Inpatient Hospital Stay
Admission: EM | Admit: 2022-08-26 | Discharge: 2022-08-31 | DRG: 988 | Disposition: A | Discharge status: Skilled Nursing Facility | Payer: Medicare Other | Source: Skilled Nursing Facility | Attending: Student in an Organized Health Care Education/Training Program | Admitting: Student in an Organized Health Care Education/Training Program

## 2022-08-26 ENCOUNTER — Emergency Department: Payer: Medicare Other

## 2022-08-26 ENCOUNTER — Other Ambulatory Visit: Payer: Self-pay

## 2022-08-26 DIAGNOSIS — G8929 Other chronic pain: Secondary | ICD-10-CM | POA: Diagnosis present

## 2022-08-26 DIAGNOSIS — J449 Chronic obstructive pulmonary disease, unspecified: Secondary | ICD-10-CM | POA: Diagnosis present

## 2022-08-26 DIAGNOSIS — Z89422 Acquired absence of other left toe(s): Secondary | ICD-10-CM | POA: Diagnosis not present

## 2022-08-26 DIAGNOSIS — Z8616 Personal history of COVID-19: Secondary | ICD-10-CM

## 2022-08-26 DIAGNOSIS — E782 Mixed hyperlipidemia: Secondary | ICD-10-CM | POA: Diagnosis not present

## 2022-08-26 DIAGNOSIS — M7981 Nontraumatic hematoma of soft tissue: Secondary | ICD-10-CM | POA: Diagnosis present

## 2022-08-26 DIAGNOSIS — K551 Chronic vascular disorders of intestine: Secondary | ICD-10-CM | POA: Diagnosis not present

## 2022-08-26 DIAGNOSIS — L02415 Cutaneous abscess of right lower limb: Secondary | ICD-10-CM | POA: Diagnosis not present

## 2022-08-26 DIAGNOSIS — Z888 Allergy status to other drugs, medicaments and biological substances status: Secondary | ICD-10-CM | POA: Diagnosis not present

## 2022-08-26 DIAGNOSIS — Z6841 Body Mass Index (BMI) 40.0 and over, adult: Secondary | ICD-10-CM

## 2022-08-26 DIAGNOSIS — I2510 Atherosclerotic heart disease of native coronary artery without angina pectoris: Secondary | ICD-10-CM | POA: Diagnosis present

## 2022-08-26 DIAGNOSIS — I110 Hypertensive heart disease with heart failure: Secondary | ICD-10-CM | POA: Diagnosis present

## 2022-08-26 DIAGNOSIS — F419 Anxiety disorder, unspecified: Secondary | ICD-10-CM | POA: Diagnosis not present

## 2022-08-26 DIAGNOSIS — Z89611 Acquired absence of right leg above knee: Secondary | ICD-10-CM

## 2022-08-26 DIAGNOSIS — D509 Iron deficiency anemia, unspecified: Secondary | ICD-10-CM | POA: Diagnosis present

## 2022-08-26 DIAGNOSIS — Z1152 Encounter for screening for COVID-19: Secondary | ICD-10-CM

## 2022-08-26 DIAGNOSIS — Z882 Allergy status to sulfonamides status: Secondary | ICD-10-CM

## 2022-08-26 DIAGNOSIS — Z85828 Personal history of other malignant neoplasm of skin: Secondary | ICD-10-CM | POA: Diagnosis not present

## 2022-08-26 DIAGNOSIS — Z79899 Other long term (current) drug therapy: Secondary | ICD-10-CM

## 2022-08-26 DIAGNOSIS — L02419 Cutaneous abscess of limb, unspecified: Principal | ICD-10-CM

## 2022-08-26 DIAGNOSIS — I480 Paroxysmal atrial fibrillation: Secondary | ICD-10-CM | POA: Diagnosis present

## 2022-08-26 DIAGNOSIS — I739 Peripheral vascular disease, unspecified: Secondary | ICD-10-CM | POA: Diagnosis present

## 2022-08-26 DIAGNOSIS — E785 Hyperlipidemia, unspecified: Secondary | ICD-10-CM | POA: Diagnosis present

## 2022-08-26 DIAGNOSIS — K219 Gastro-esophageal reflux disease without esophagitis: Secondary | ICD-10-CM | POA: Diagnosis present

## 2022-08-26 DIAGNOSIS — Z87891 Personal history of nicotine dependence: Secondary | ICD-10-CM | POA: Diagnosis not present

## 2022-08-26 DIAGNOSIS — Z7901 Long term (current) use of anticoagulants: Secondary | ICD-10-CM

## 2022-08-26 DIAGNOSIS — S7011XA Contusion of right thigh, initial encounter: Secondary | ICD-10-CM | POA: Diagnosis present

## 2022-08-26 DIAGNOSIS — T8789 Other complications of amputation stump: Secondary | ICD-10-CM | POA: Diagnosis not present

## 2022-08-26 DIAGNOSIS — I252 Old myocardial infarction: Secondary | ICD-10-CM

## 2022-08-26 DIAGNOSIS — Z79891 Long term (current) use of opiate analgesic: Secondary | ICD-10-CM

## 2022-08-26 DIAGNOSIS — E876 Hypokalemia: Secondary | ICD-10-CM | POA: Diagnosis present

## 2022-08-26 DIAGNOSIS — E66813 Obesity, class 3: Secondary | ICD-10-CM

## 2022-08-26 DIAGNOSIS — F3340 Major depressive disorder, recurrent, in remission, unspecified: Secondary | ICD-10-CM | POA: Diagnosis not present

## 2022-08-26 DIAGNOSIS — F339 Major depressive disorder, recurrent, unspecified: Secondary | ICD-10-CM | POA: Diagnosis present

## 2022-08-26 DIAGNOSIS — L8990 Pressure ulcer of unspecified site, unspecified stage: Secondary | ICD-10-CM | POA: Insufficient documentation

## 2022-08-26 DIAGNOSIS — Z8249 Family history of ischemic heart disease and other diseases of the circulatory system: Secondary | ICD-10-CM

## 2022-08-26 DIAGNOSIS — I5022 Chronic systolic (congestive) heart failure: Secondary | ICD-10-CM

## 2022-08-26 DIAGNOSIS — M79604 Pain in right leg: Secondary | ICD-10-CM | POA: Diagnosis present

## 2022-08-26 DIAGNOSIS — I5032 Chronic diastolic (congestive) heart failure: Secondary | ICD-10-CM | POA: Diagnosis not present

## 2022-08-26 DIAGNOSIS — Z9049 Acquired absence of other specified parts of digestive tract: Secondary | ICD-10-CM

## 2022-08-26 DIAGNOSIS — G894 Chronic pain syndrome: Secondary | ICD-10-CM | POA: Diagnosis not present

## 2022-08-26 LAB — CBC WITH DIFFERENTIAL/PLATELET
Abs Immature Granulocytes: 0.01 10*3/uL (ref 0.00–0.07)
Basophils Absolute: 0 10*3/uL (ref 0.0–0.1)
Basophils Relative: 0 %
Eosinophils Absolute: 0.3 10*3/uL (ref 0.0–0.5)
Eosinophils Relative: 4 %
HCT: 33.6 % — ABNORMAL LOW (ref 36.0–46.0)
Hemoglobin: 10.7 g/dL — ABNORMAL LOW (ref 12.0–15.0)
Immature Granulocytes: 0 %
Lymphocytes Relative: 30 %
Lymphs Abs: 2.2 10*3/uL (ref 0.7–4.0)
MCH: 29.1 pg (ref 26.0–34.0)
MCHC: 31.8 g/dL (ref 30.0–36.0)
MCV: 91.3 fL (ref 80.0–100.0)
Monocytes Absolute: 0.7 10*3/uL (ref 0.1–1.0)
Monocytes Relative: 10 %
Neutro Abs: 4 10*3/uL (ref 1.7–7.7)
Neutrophils Relative %: 56 %
Platelets: 232 10*3/uL (ref 150–400)
RBC: 3.68 MIL/uL — ABNORMAL LOW (ref 3.87–5.11)
RDW: 16.9 % — ABNORMAL HIGH (ref 11.5–15.5)
WBC: 7.2 10*3/uL (ref 4.0–10.5)
nRBC: 0 % (ref 0.0–0.2)

## 2022-08-26 LAB — MAGNESIUM: Magnesium: 2 mg/dL (ref 1.7–2.4)

## 2022-08-26 LAB — BASIC METABOLIC PANEL
Anion gap: 10 (ref 5–15)
BUN: 19 mg/dL (ref 8–23)
CO2: 31 mmol/L (ref 22–32)
Calcium: 9.1 mg/dL (ref 8.9–10.3)
Chloride: 95 mmol/L — ABNORMAL LOW (ref 98–111)
Creatinine, Ser: 0.83 mg/dL (ref 0.44–1.00)
GFR, Estimated: >60 mL/min (ref >60)
Glucose, Bld: 117 mg/dL — ABNORMAL HIGH (ref 70–99)
Potassium: 3.1 mmol/L — ABNORMAL LOW (ref 3.5–5.1)
Sodium: 136 mmol/L (ref 135–145)

## 2022-08-26 LAB — SARS CORONAVIRUS 2 BY RT PCR: SARS Coronavirus 2 by RT PCR: NEGATIVE

## 2022-08-26 LAB — PROTIME-INR
INR: 1.3 — ABNORMAL HIGH (ref 0.8–1.2)
Prothrombin Time: 16.2 seconds — ABNORMAL HIGH (ref 11.4–15.2)

## 2022-08-26 LAB — HEPARIN LEVEL (UNFRACTIONATED): Heparin Unfractionated: >1.1 IU/mL — ABNORMAL HIGH (ref 0.30–0.70)

## 2022-08-26 LAB — SURGICAL PCR SCREEN
MRSA, PCR: NEGATIVE
Staphylococcus aureus: NEGATIVE

## 2022-08-26 LAB — APTT
aPTT: >200 seconds (ref 24–36)
aPTT: 83 seconds — ABNORMAL HIGH (ref 24–36)

## 2022-08-26 LAB — BRAIN NATRIURETIC PEPTIDE: B Natriuretic Peptide: 11.4 pg/mL (ref 0.0–100.0)

## 2022-08-26 MED ORDER — HEPARIN BOLUS VIA INFUSION
4000.0000 [IU] | Freq: Once | INTRAVENOUS | Status: AC
  Administered 2022-08-26: 4000 [IU] via INTRAVENOUS
  Filled 2022-08-26: qty 4000

## 2022-08-26 MED ORDER — MORPHINE SULFATE (PF) 2 MG/ML IV SOLN
2.0000 mg | INTRAVENOUS | Status: DC | PRN
  Administered 2022-08-27 – 2022-08-30 (×12): 2 mg via INTRAVENOUS
  Filled 2022-08-26 (×14): qty 1

## 2022-08-26 MED ORDER — ASPIRIN 81 MG PO TBEC
81.0000 mg | DELAYED_RELEASE_TABLET | Freq: Every day | ORAL | Status: DC
  Administered 2022-08-26 – 2022-08-31 (×5): 81 mg via ORAL
  Filled 2022-08-26 (×5): qty 1

## 2022-08-26 MED ORDER — LIDOCAINE 5 % EX PTCH
1.0000 | MEDICATED_PATCH | CUTANEOUS | Status: DC
  Administered 2022-08-26 – 2022-08-30 (×5): 1 via TRANSDERMAL
  Filled 2022-08-26 (×5): qty 1

## 2022-08-26 MED ORDER — SENNOSIDES-DOCUSATE SODIUM 8.6-50 MG PO TABS
1.0000 | ORAL_TABLET | Freq: Every day | ORAL | Status: DC
  Administered 2022-08-26 – 2022-08-29 (×4): 1 via ORAL
  Filled 2022-08-26 (×4): qty 1

## 2022-08-26 MED ORDER — IOHEXOL 300 MG/ML  SOLN
100.0000 mL | Freq: Once | INTRAMUSCULAR | Status: AC | PRN
  Administered 2022-08-26: 100 mL via INTRAVENOUS

## 2022-08-26 MED ORDER — PIPERACILLIN-TAZOBACTAM 3.375 G IVPB 30 MIN
3.3750 g | Freq: Once | INTRAVENOUS | Status: AC
  Administered 2022-08-26: 3.375 g via INTRAVENOUS
  Filled 2022-08-26: qty 50

## 2022-08-26 MED ORDER — ALBUTEROL SULFATE (2.5 MG/3ML) 0.083% IN NEBU: 2.5000 mg | INHALATION_SOLUTION | RESPIRATORY_TRACT | Status: DC | PRN

## 2022-08-26 MED ORDER — ONDANSETRON HCL 4 MG/2ML IJ SOLN: 4.0000 mg | Freq: Three times a day (TID) | INTRAMUSCULAR | Status: DC | PRN

## 2022-08-26 MED ORDER — DM-GUAIFENESIN ER 30-600 MG PO TB12: 1.0000 | ORAL_TABLET | Freq: Two times a day (BID) | ORAL | Status: DC | PRN

## 2022-08-26 MED ORDER — FUROSEMIDE 20 MG PO TABS
20.0000 mg | ORAL_TABLET | Freq: Every day | ORAL | Status: DC
  Administered 2022-08-26 – 2022-08-31 (×5): 20 mg via ORAL
  Filled 2022-08-26 (×5): qty 1

## 2022-08-26 MED ORDER — ADULT MULTIVITAMIN W/MINERALS CH
1.0000 | ORAL_TABLET | Freq: Every day | ORAL | Status: DC
  Administered 2022-08-26 – 2022-08-31 (×5): 1 via ORAL
  Filled 2022-08-26 (×5): qty 1

## 2022-08-26 MED ORDER — IPRATROPIUM-ALBUTEROL 20-100 MCG/ACT IN AERS
1.0000 | INHALATION_SPRAY | Freq: Four times a day (QID) | RESPIRATORY_TRACT | Status: DC
  Administered 2022-08-26 – 2022-08-31 (×17): 1 via RESPIRATORY_TRACT
  Filled 2022-08-26: qty 4

## 2022-08-26 MED ORDER — ALUM & MAG HYDROXIDE-SIMETH 200-200-20 MG/5ML PO SUSP: 15.0000 mL | ORAL | Status: DC | PRN

## 2022-08-26 MED ORDER — VANCOMYCIN HCL IN DEXTROSE 1-5 GM/200ML-% IV SOLN
1000.0000 mg | Freq: Once | INTRAVENOUS | Status: AC
  Administered 2022-08-26: 1000 mg via INTRAVENOUS
  Filled 2022-08-26: qty 200

## 2022-08-26 MED ORDER — MORPHINE SULFATE ER 15 MG PO TBCR
15.0000 mg | EXTENDED_RELEASE_TABLET | Freq: Two times a day (BID) | ORAL | Status: DC
  Administered 2022-08-26 – 2022-08-29 (×7): 15 mg via ORAL
  Filled 2022-08-26 (×7): qty 1

## 2022-08-26 MED ORDER — HYDRALAZINE HCL 20 MG/ML IJ SOLN: 5.0000 mg | INTRAMUSCULAR | Status: DC | PRN

## 2022-08-26 MED ORDER — DOCUSATE SODIUM 100 MG PO CAPS
100.0000 mg | ORAL_CAPSULE | Freq: Every day | ORAL | Status: DC | PRN
  Administered 2022-08-27: 100 mg via ORAL
  Filled 2022-08-26: qty 1

## 2022-08-26 MED ORDER — SODIUM CHLORIDE 0.9 % IV SOLN
2.0000 g | INTRAVENOUS | Status: DC
  Administered 2022-08-26 – 2022-08-28 (×3): 2 g via INTRAVENOUS
  Filled 2022-08-26 (×2): qty 20
  Filled 2022-08-26 (×2): qty 2

## 2022-08-26 MED ORDER — PANTOPRAZOLE SODIUM 40 MG PO TBEC
40.0000 mg | DELAYED_RELEASE_TABLET | Freq: Every day | ORAL | Status: DC
  Administered 2022-08-26 – 2022-08-31 (×5): 40 mg via ORAL
  Filled 2022-08-26 (×5): qty 1

## 2022-08-26 MED ORDER — POTASSIUM CHLORIDE CRYS ER 20 MEQ PO TBCR
40.0000 meq | EXTENDED_RELEASE_TABLET | Freq: Once | ORAL | Status: AC
  Administered 2022-08-26: 40 meq via ORAL
  Filled 2022-08-26: qty 2

## 2022-08-26 MED ORDER — FERROUS SULFATE 325 (65 FE) MG PO TABS
325.0000 mg | ORAL_TABLET | Freq: Every day | ORAL | Status: DC
  Administered 2022-08-26 – 2022-08-31 (×5): 325 mg via ORAL
  Filled 2022-08-26 (×5): qty 1

## 2022-08-26 MED ORDER — SODIUM CHLORIDE 0.9 % IV SOLN: INTRAVENOUS | Status: DC | PRN

## 2022-08-26 MED ORDER — MORPHINE SULFATE (PF) 4 MG/ML IV SOLN
4.0000 mg | Freq: Once | INTRAVENOUS | Status: AC
  Administered 2022-08-26: 4 mg via INTRAVENOUS
  Filled 2022-08-26: qty 1

## 2022-08-26 MED ORDER — METOLAZONE 2.5 MG PO TABS
2.5000 mg | ORAL_TABLET | Freq: Every day | ORAL | Status: DC
  Administered 2022-08-26 – 2022-08-31 (×6): 2.5 mg via ORAL
  Filled 2022-08-26 (×6): qty 1

## 2022-08-26 MED ORDER — PREGABALIN 75 MG PO CAPS
200.0000 mg | ORAL_CAPSULE | Freq: Three times a day (TID) | ORAL | Status: DC
  Administered 2022-08-26 – 2022-08-31 (×14): 200 mg via ORAL
  Filled 2022-08-26 (×14): qty 1

## 2022-08-26 MED ORDER — METOPROLOL TARTRATE 25 MG PO TABS
12.5000 mg | ORAL_TABLET | Freq: Two times a day (BID) | ORAL | Status: DC
  Administered 2022-08-26 – 2022-08-31 (×9): 12.5 mg via ORAL
  Filled 2022-08-26 (×9): qty 1

## 2022-08-26 MED ORDER — ISOSORBIDE MONONITRATE ER 30 MG PO TB24
60.0000 mg | ORAL_TABLET | Freq: Every day | ORAL | Status: DC
  Administered 2022-08-26 – 2022-08-31 (×5): 60 mg via ORAL
  Filled 2022-08-26 (×5): qty 2

## 2022-08-26 MED ORDER — POLYETHYLENE GLYCOL 3350 17 G PO PACK
17.0000 g | PACK | Freq: Every day | ORAL | Status: DC
  Administered 2022-08-26 – 2022-08-30 (×4): 17 g via ORAL
  Filled 2022-08-26 (×5): qty 1

## 2022-08-26 MED ORDER — CITALOPRAM HYDROBROMIDE 10 MG PO TABS
40.0000 mg | ORAL_TABLET | Freq: Every day | ORAL | Status: DC
  Administered 2022-08-26 – 2022-08-31 (×5): 40 mg via ORAL
  Filled 2022-08-26 (×6): qty 4

## 2022-08-26 MED ORDER — ACETAMINOPHEN 325 MG PO TABS: 650.0000 mg | ORAL_TABLET | Freq: Four times a day (QID) | ORAL | Status: DC | PRN

## 2022-08-26 MED ORDER — HEPARIN (PORCINE) 25000 UT/250ML-% IV SOLN
1800.0000 [IU]/h | INTRAVENOUS | Status: DC
  Administered 2022-08-26 – 2022-08-27 (×3): 1400 [IU]/h via INTRAVENOUS
  Administered 2022-08-28: 1550 [IU]/h via INTRAVENOUS
  Administered 2022-08-29: 1800 [IU]/h via INTRAVENOUS
  Filled 2022-08-26 (×5): qty 250

## 2022-08-26 MED ORDER — HYDROCODONE-ACETAMINOPHEN 5-325 MG PO TABS
1.0000 | ORAL_TABLET | ORAL | Status: DC | PRN
  Administered 2022-08-26 – 2022-08-29 (×7): 2 via ORAL
  Administered 2022-08-29: 1 via ORAL
  Administered 2022-08-29 – 2022-08-31 (×8): 2 via ORAL
  Filled 2022-08-26 (×2): qty 2
  Filled 2022-08-26: qty 1
  Filled 2022-08-26 (×15): qty 2

## 2022-08-26 MED ORDER — ATORVASTATIN CALCIUM 20 MG PO TABS
80.0000 mg | ORAL_TABLET | Freq: Every day | ORAL | Status: DC
  Administered 2022-08-26 – 2022-08-30 (×5): 80 mg via ORAL
  Filled 2022-08-26 (×5): qty 4

## 2022-08-26 NOTE — Progress Notes (Signed)
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   Allergies  Allergen Reactions   Gabapentin     Tremors    Bactrim [Sulfamethoxazole-Trimethoprim] Itching    Patient Measurements: Height: '5\' 2"'$  (157.5 cm) Weight: 102.6 kg (226 lb 3.1 oz) IBW/kg (Calculated) : 50.1 Heparin Dosing Weight: 75 kg  Vital Signs: Temp: 97.7 F (36.5 C) (11/04 1756) Temp Source: Oral (11/04 1236) BP: 106/71 (11/04 1756) Pulse Rate: 78 (11/04 1756)  Labs: Recent Labs    08/26/22 0810 08/26/22 1316 08/26/22 1922  HGB 10.7*  --   --   HCT 33.6*  --   --   PLT 232  --   --   APTT  --  >200* 83*  LABPROT  --  16.2*  --   INR  --  1.3*  --   HEPARINUNFRC  --  >1.10*  --   CREATININE 0.83  --   --      Estimated Creatinine Clearance: 78.9 mL/min (by C-G formula based on SCr of 0.83 mg/dL).   Medical History: Past Medical History:  Diagnosis Date   Abdominal aortic atherosclerosis ([place])    a. 05/2017 CTA abd/pelvis: significant atherosclerotic dzs of the infrarenal abd Ao w/ some mural thrombus. No aneurysm or dissection.   Acute focal ischemia of small intestine (HCC)    Acute right-sided low back pain with right-sided sciatica 06/13/2017   AKI (acute kidney injury) ([place]) 07/29/2017   Anemia    Anginal pain ([place])    a. 08/2012 Lexiscan MV: EF 54%, non ischemia/infarct.   Anxiety and depression    Baker's cyst of knee, right    a. 07/2016 U/S: 4.1 x 1.4 x 2.9 cystic structure in R poplitetal fossa.   Bell's palsy    CAD (coronary artery disease)    CHF (congestive heart failure) ([place])    a.) TTE 06/20/2017: EF 60-65%, no rwma, G1DD, no source of cardiac emboli.   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 05/2017 CTA Abd/pelvis: apparent thrombus or embolus in prox SMA (70-90%); b. 05/2017 catheter directed tPA, mechanical thrombectomy, and stenting of the SMA.   Essential hypertension with goal blood pressure less than 140/90 01/19/2016   Gastroesophageal reflux disease 01/19/2016   GERD (gastroesophageal reflux disease)    H/O colectomy    History of 2019 novel coronavirus disease (COVID-19) 11/14/2019   History of kidney stones    Hyperlipidemia    Hypertension    Morbid obesity with BMI of 40.0-44.9, adult ([place])    NSTEMI (non-ST elevated myocardial infarction) ([place]) 06/27/2021   a.) high sensitivity troponins trended: 893 --> 1587 --> 1748 --> 868 --> 735 ng/L   Occlusive mesenteric ischemia ([place]) 06/19/2017   Osteoarthritis    PAD (peripheral artery disease) (HCC)    PAF (paroxysmal atrial fibrillation) (HCC)    a.) CHA2DS2-VASc = 6 (sex, CHF, HTN, DVT x2, aortic plaque). b.) rate/rhythm maintained on oral metoprolol tartrate; chronically anticoagulated with standard dose apixaban.   Palpitations    Peripheral edema    Personal history of other malignant neoplasm of skin 06/01/2016   Pneumonia    Pre-diabetes    Primary osteoarthritis of  both knees 06/13/2017   SBO (small bowel obstruction) ([place]) 08/06/2017   Skin cancer of nose    Superior mesenteric artery thrombosis (HCC)    Vitamin D deficiency     Assessment: 61 y.o. female with medical history significant for peripheral arterial disease s/p right AKA, chronic diastolic dysfunction CHF, anxiety and depression, COPD, paroxysmal A-fib on chronic anticoagulation therapy, who presents to the ED with  increasing pain in her right AKA stump. A review of medical records reveals apixaban therapy prior to arrival (unknown last dose)   Goal of Therapy:  Heparin level 0.3-0.7 units/ml aPTT 66 - 102 seconds Monitor platelets by anticoagulation protocol: Yes    Date/Time  aPTT/HL  Comment 11/04'@1922'$   aPTT=83  Therapeutic x1  at 1400 un/hr  Plan:  Continue hepatin infusion at 1400 units/hr Check aPTT level in 6 hours to confirm therapeutic level Continue to monitor H&H and platelets  Iylah Dworkin Rodriguez-Guzman PharmD, BCPS 08/26/2022 9:18 PM

## 2022-08-26 NOTE — Consult Note (Signed)
Pharmacy Antibiotic Note  Carla [name] is a 61 y.o. female  w/ h/o peripheral arterial disease s/p right AKA, chronic diastolic dysfunction CHF, anxiety and depression, COPD, paroxysmal A-fib on chronic anticoagulation therapy, who presents to the ED with increasing pain in her right AKA stump with abscess. Pharmacy has been consulted for vancomycin dosing.  Plan: F/u MRSA PCR ordered & cultures.  Vancomycin 2g IV (1g + 1g)  x1 for loading dose; followed by: Vancomycin 1g IV q12h Goal AUC 400-600  Est AUC: 558; Cmax: 34.3; Cmin: 15.9 SCr 0.83; AdjBW (BMI 41); Vd 0.5 Pt also received Zosyn 3.375g IV x1; followed by ceftriaxone 2g IV q24h   Height: '5\' 2"'$  (157.5 cm) Weight: 102.6 kg (226 lb 3.1 oz) IBW/kg (Calculated) : 50.1  Temp (24hrs), Avg:97.9 F (36.6 C), Min:97.7 F (36.5 C), Max:98.2 F (36.8 C)  Recent Labs  Lab 08/26/22 0810  WBC 7.2  CREATININE 0.83    Estimated Creatinine Clearance: 78.9 mL/min (by C-G formula based on SCr of 0.83 mg/dL).    Allergies  Allergen Reactions   Gabapentin     Tremors    Bactrim [Sulfamethoxazole-Trimethoprim] Itching    Antimicrobials this admission: VAN/ZOS x1 (11/4) VAN/CRO (11/4 >>   Dose adjustments this admission: CTM and adjust PRN  Microbiology results: 11/4 BCx: ordered 11/4 MRSA PCR: ordered 11/4 Covid PCR: negative  Thank you for allowing pharmacy to be a part of this patient's care.  Lorna Dibble 08/26/2022 6:39 PM

## 2022-08-26 NOTE — ED Triage Notes (Signed)
Pt to ED from [facility] via [facility]. Pt began having R knee pain last night around midnight. R BKA in 6/23. EDP examining pt. Pt complains of severe pain in R leg up into R hip. Pt takes hydrocodone at home and has neuropathy. Pt says had CABG "4-5 weeks ago". Pt has scar on chest appearing to have been from pacemaker placement (not CABG). Pt has abdominal hernia.

## 2022-08-26 NOTE — ED Provider Notes (Signed)
[facility] Provider Note    Event Date/Time   First MD Initiated Contact with Patient 08/26/22 [phone]     (approximate)   History   Chief Complaint Leg Pain   HPI  Carla [name] is a 61 y.o. female with past medical history of PAD status post right AKA and left axillary femoral bypass graft, diastolic CHF, COPD, and atrial fibrillation on Eliquis who presents to the ED complaining of leg pain.  Patient reports that she has been dealing with increasing pain in her right AKA stump over the past 2 days.  She reports sharp and severe pain primarily on the distal anterior portion of her right AKA stump.  She reports that they have been giving her MS Contin and hydrocodone at her nursing facility without relief of the pain.  She denies any falls or other traumatic injury to the stump, has not noticed any redness or swelling, and denies any drainage from this site.  EMS reports that patient tested positive for COVID-19 about 1 week ago, but patient denies any cough, chest pain, or shortness of breath.     Physical Exam   Triage Vital Signs: ED Triage Vitals  Enc Vitals Group     BP --      Pulse Rate 08/26/22 0803 77     Resp 08/26/22 0803 (!) 24     Temp 08/26/22 0803 98 F (36.7 C)     Temp Source 08/26/22 0803 Oral     SpO2 08/26/22 0803 93 %     Weight 08/26/22 0807 229 lb (103.9 kg)     Height 08/26/22 0807 '5\' 2"'$  (1.575 m)     Head Circumference --      Peak Flow --      Pain Score 08/26/22 0807 10     Pain Loc --      Pain Edu? --      Excl. in [place]? --     Most recent vital signs: Vitals:   08/26/22 0803 08/26/22 0930  BP: (!) 130/58 113/67  Pulse: 77 83  Resp: (!) 24 11  Temp: 98 F (36.7 C)   SpO2: 93% 93%    Constitutional: Alert and oriented. Eyes: Conjunctivae are normal. Head: Atraumatic. Nose: No congestion/rhinnorhea. Mouth/Throat: Mucous membranes are moist.  Cardiovascular: Normal rate, regular rhythm. Grossly normal heart  sounds.  2+ radial pulses bilaterally, 1+ DP pulse on left. Respiratory: Normal respiratory effort.  No retractions. Lungs CTAB. Gastrointestinal: Soft and nontender. No distention. Musculoskeletal: Right AKA stump with tenderness to palpation at the anterior portion, no associated erythema, warmth, or drainage noted.  Surgical site appears to be healing well.  Left lower extremity without edema or tenderness, leg warm and well-perfused. Neurologic:  Normal speech and language. No gross focal neurologic deficits are appreciated.    ED Results / Procedures / Treatments   Labs (all labs ordered are listed, but only abnormal results are displayed) Labs Reviewed  CBC WITH DIFFERENTIAL/PLATELET - Abnormal; Notable for the following components:      Result Value   RBC 3.68 (*)    Hemoglobin 10.7 (*)    HCT 33.6 (*)    RDW 16.9 (*)    All other components within normal limits  BASIC METABOLIC PANEL - Abnormal; Notable for the following components:   Potassium 3.1 (*)    Chloride 95 (*)    Glucose, Bld 117 (*)    All other components within normal limits  RADIOLOGY Right femur x-ray reviewed and interpreted by me with no fracture or dislocation.  Right femur CT imaging reviewed and interpreted by me with large fluid collection near AKI site, enlarged compared to previous.  PROCEDURES:  Critical Care performed: No  Procedures   MEDICATIONS ORDERED IN ED: Medications  vancomycin (VANCOCIN) IVPB 1000 mg/200 mL premix (1,000 mg Intravenous New Bag/Given 08/26/22 1054)  piperacillin-tazobactam (ZOSYN) IVPB 3.375 g (3.375 g Intravenous New Bag/Given 08/26/22 1049)  morphine (PF) 4 MG/ML injection 4 mg (4 mg Intravenous Given 08/26/22 0821)  potassium chloride SA (KLOR-CON M) CR tablet 40 mEq (40 mEq Oral Given 08/26/22 0951)  iohexol (OMNIPAQUE) 300 MG/ML solution 100 mL (100 mLs Intravenous Contrast Given 08/26/22 0907)     IMPRESSION / MDM / ASSESSMENT AND PLAN / ED COURSE  I  reviewed the triage vital signs and the nursing notes.                              61 y.o. female with past medical history of PAD status post right AKA and left axillary femoral bypass graft, diastolic CHF, COPD, and atrial fibrillation on Eliquis who presents to the ED complaining of worsening pain in anterior portion of her right AKA stump.  Patient's presentation is most consistent with acute presentation with potential threat to life or bodily function.  Differential diagnosis includes, but is not limited to, abscess, cellulitis, phantom limb pain, chronic pain syndrome.  Patient uncomfortable but nontoxic-appearing and in no acute distress, vital signs are unremarkable.  Her right AKA stump appears to be well-healed with no overt signs of infection, although she does have significant tenderness over the anterior portion of her stump.  During admission last month, she was noted to have fluid collection in this area that was treated as abscess with cefepime but no drainage was performed.  During that admission, she was also noted to have ischemic left lower extremity managed with bypass grafting and subsequent thrombectomy.  Left lower extremity appears well-perfused currently without significant pain.  It does appear that patient had significant pain management issues during her last admission, subsequently started on MS Contin but is still having issues with pain control.  We will check labs and reimage her right AKA stump for evidence of worsening abscess, treat symptomatically with IV morphine and reassess.  Labs remarkable for mild hypokalemia but otherwise reassuring with stable anemia and no significant leukocytosis.  X-ray of right femur is unremarkable, however CT imaging shows enlarging fluid collection in this area.  Findings discussed with Dr. Trula Slade of vascular surgery, who recommends IR consultation for potential aspiration to determine whether this is infection or seroma.  In the  meantime, plan for admission to hospitalist service and will treat with antibiotics given concern for worsening abscess.  Given she was previously treated with cefepime, will broaden to Vanco and Zosyn, case discussed with hospitalist for admission.      FINAL CLINICAL IMPRESSION(S) / ED DIAGNOSES   Final diagnoses:  Leg abscess  Status post above-knee amputation of right lower extremity ([place])     Rx / DC Orders   ED Discharge Orders     None        Note:  This document was prepared using Dragon voice recognition software and may include unintentional dictation errors.   Blake Divine, MD 08/26/22 1115

## 2022-08-26 NOTE — ED Notes (Signed)
Pt sleeping. 

## 2022-08-26 NOTE — H&P (Addendum)
History and Physical    TRENELL MOXEY IPJ:[id number] DOB: May 23, 1961 DOA: 08/26/2022  Referring MD/NP/PA:   [facility]P: [facility]   Patient coming from:  The patient is coming from SNF  Chief Complaint: increasing pain in right AKA stump  HPI: Carla [name] is a 61 y.o. female with medical history significant of PAD status post right AKA and left axillary femoral bypass graft, diastolic CHF, COPD, and superior mesenteric artery thrombus and atrial fibrillation on Eliquis, hypertension, hyperlipidemia, depression, CAD, obesity, chronic pain, anemia, right knee abscess at AKA site, who present with increasing pain in right AKA stump.   Pt had abscess of right knee at AKA site recently. She completed a course of Abx. Now comes back with worsening pain in that site in the past several days.  The pain is constant, sharp, severe, nonradiating.  Patient also has erythema at right AKA stump site.  Patient does not have fever or chills.  Denies cough, shortness breath, chest pain.  No nausea vomiting, diarrhea or abdominal pain.  No symptoms of UTI.  Patient states that she took her last dose of Eliquis last night.  Data reviewed independently and ED Course: pt was found to have WBC 7.2, potassium 3.1, GFR> 60, temperature normal, blood pressure 113/67, heart rate 83, RR 24, oxygen saturation 93% on room air.  Negative COVID PCR.  X-ray of left femur is negative for bony fracture.  Patient is admitted to telemetry bed as inpatient. VVS, Dr. Trula Slade is consulted.   CT-right femur showed: 1. Postsurgical changes of above knee amputation of the right leg. 2. Complex mixed attenuation fluid collection surrounding the distal femoral resection margin has increased in size from prior, now measuring 7.0 x 6.0 x 6.7 cm, previously measured 4.5 x 2.7 x 2.3 cm. Appearance remains nonspecific and could represent an adventitial bursa, which could be sterile or infected. Abscess is also a concern,  although there is relatively little surrounding inflammatory changes 3. Stippled appearance of the distal femoral diaphysis is unchanged in appearance compared to the prior CT and may be related to disuse demineralization. Erosive/destructive changes is felt to be less likely given the lack of interval change. 4. Large ventral abdominal wall hernia containing multiple non-compromised appearing loops of large and small bowel.    EKG: I have personally reviewed.  Sinus rhythm, QTc 450, low voltage.   Review of Systems:   General: no fevers, chills, no body weight gain, has poor appetite, has fatigue HEENT: no blurry vision, hearing changes or sore throat Respiratory: no dyspnea, coughing, wheezing CV: no chest pain, no palpitations GI: no nausea, vomiting, abdominal pain, diarrhea, constipation GU: no dysuria, burning on urination, increased urinary frequency, hematuria  Ext: s/p of right AKA, with erythema and pain in the stump area Neuro: no unilateral weakness, numbness, or tingling, no vision change or hearing loss Skin: no rash, no skin tear. MSK: No muscle spasm, no deformity, no limitation of range of movement in spin Heme: No easy bruising.  Travel history: No recent long distant travel.   Allergy:  Allergies  Allergen Reactions   Gabapentin     Tremors    Bactrim [Sulfamethoxazole-Trimethoprim] Itching    Past Medical History:  Diagnosis Date   Abdominal aortic atherosclerosis ([place])    a. 05/2017 CTA abd/pelvis: significant atherosclerotic dzs of the infrarenal abd Ao w/ some mural thrombus. No aneurysm or dissection.   Acute focal ischemia of small intestine (HCC)    Acute right-sided low  back pain with right-sided sciatica 06/13/2017   AKI (acute kidney injury) ([place]) 07/29/2017   Anemia    Anginal pain ([place])    a. 08/2012 Lexiscan MV: EF 54%, non ischemia/infarct.   Anxiety and depression    Baker's cyst of knee, right    a. 07/2016 U/S: 4.1 x 1.4 x 2.9 cystic  structure in R poplitetal fossa.   Bell's palsy    CAD (coronary artery disease)    CHF (congestive heart failure) ([place])    a.) TTE 06/20/2017: EF 60-65%, no rwma, G1DD, no source of cardiac emboli.   Chronic anticoagulation    a.) apixaban   Chronic idiopathic constipation    Chronic mesenteric ischemia (HCC)    Chronic pain    COPD (chronic obstructive pulmonary disease) (HCC)    Dyspnea    Embolus of superior mesenteric artery ([place])    a. 05/2017 CTA Abd/pelvis: apparent thrombus or embolus in prox SMA (70-90%); b. 05/2017 catheter directed tPA, mechanical thrombectomy, and stenting of the SMA.   Essential hypertension with goal blood pressure less than 140/90 01/19/2016   Gastroesophageal reflux disease 01/19/2016   GERD (gastroesophageal reflux disease)    H/O colectomy    History of 2019 novel coronavirus disease (COVID-19) 11/14/2019   History of kidney stones    Hyperlipidemia    Hypertension    Morbid obesity with BMI of 40.0-44.9, adult ([place])    NSTEMI (non-ST elevated myocardial infarction) ([place]) 06/27/2021   a.) high sensitivity troponins trended: 893 --> 1587 --> 1748 --> 868 --> 735 ng/L   Occlusive mesenteric ischemia ([place]) 06/19/2017   Osteoarthritis    PAD (peripheral artery disease) (HCC)    PAF (paroxysmal atrial fibrillation) (HCC)    a.) CHA2DS2-VASc = 6 (sex, CHF, HTN, DVT x2, aortic plaque). b.) rate/rhythm maintained on oral metoprolol tartrate; chronically anticoagulated with standard dose apixaban.   Palpitations    Peripheral edema    Personal history of other malignant neoplasm of skin 06/01/2016   Pneumonia    Pre-diabetes    Primary osteoarthritis of both knees 06/13/2017   SBO (small bowel obstruction) ([place]) 08/06/2017   Skin cancer of nose    Superior mesenteric artery thrombosis (HCC)    Vitamin D deficiency     Past Surgical History:  Procedure Laterality Date   AMPUTATION Right 04/06/2022   Procedure: AMPUTATION ABOVE KNEE;  Surgeon: Algernon Huxley, MD;  Location: [facility];  Service: Vascular;  Laterality: Right;   AMPUTATION TOE Left 12/09/2019   Procedure: AMPUTATION TOE MPJ left;  Surgeon: Caroline More, DPM;  Location: [facility];  Service: Podiatry;  Laterality: Left;   APPENDECTOMY     APPLICATION OF WOUND VAC  12/01/2021   Procedure: APPLICATION OF WOUND VAC;  Surgeon: Algernon Huxley, MD;  Location: [facility];  Service: Vascular;;   APPLICATION OF WOUND VAC Right 05/03/2022   Procedure: APPLICATION OF WOUND VAC;  Surgeon: Algernon Huxley, MD;  Location: [facility];  Service: Vascular;  Laterality: Right;   APPLICATION OF WOUND VAC Left 07/23/2022   Procedure: APPLICATION OF WOUND VAC;  Surgeon: Serafina Mitchell, MD;  Location: [facility];  Service: Vascular;  Laterality: Left;  [place]   AXILLARY-FEMORAL BYPASS GRAFT Left 07/23/2022   Procedure: BYPASS GRAFT AXILLA-BIFEMORAL;  Surgeon: Serafina Mitchell, MD;  Location: [facility];  Service: Vascular;  Laterality: Left;   CHOLECYSTECTOMY     COLON SURGERY     COLONOSCOPY WITH PROPOFOL N/A 03/23/2022   Procedure: COLONOSCOPY WITH  PROPOFOL;  Surgeon: Jonathon Bellows, MD;  Location: [facility];  Service: Gastroenterology;  Laterality: N/A;  rectal bleed   EMBOLECTOMY OR THROMBECTOMY, WITH OR WITHOUT CATHETER; FEMOROPOPLITEAL, AORTOILIAC ARTERY, BY LEG INCISION N/A 06/21/2020   Left - Decomp Fasciotomy Leg; Ant &/Or Lat Compart Only; Midline - Angiography, Visceral, Selective Or Supraselective (With Or Without Flush Aortogram); Left - Revascularize, Endovasc, Open/Percut, Iliac Artery, Unilat, Initial Vessel; W/Translum Stent, W/Angioplasty; Right - Revascularize, Endovasc, Open/Percut, Iliac Artery, Ea Add`L Ipsilateral; W/Translumin Stent, W/Angioplasty; Location [facility];   ENDARTERECTOMY FEMORAL Left 12/01/2021   Procedure: ENDARTERECTOMY FEMORAL;  Surgeon: Algernon Huxley, MD;  Location: [facility];  Service: Vascular;  Laterality: Left;   ENDOVASCULAR REPAIR/STENT GRAFT Left 12/01/2021   Procedure:  ENDOVASCULAR REPAIR/STENT GRAFT;  Surgeon: Algernon Huxley, MD;  Location: [place] [facility];  Service: Cardiovascular;  Laterality: Left;   INCISION AND DRAINAGE OF WOUND Right 05/03/2022   Procedure: IRRIGATION AND DEBRIDEMENT RIGHT STUMP;  Surgeon: Algernon Huxley, MD;  Location: [facility];  Service: Vascular;  Laterality: Right;   LAPAROTOMY N/A 08/08/2017   Procedure: EXPLORATORY LAPAROTOMY POSSIBLE BOWEL RESECTION;  Surgeon: Jules Husbands, MD;  Location: [facility];  Service: General;  Laterality: N/A;   LEFT HEART CATH AND CORONARY ANGIOGRAPHY N/A 05/17/2021   Procedure: LEFT HEART CATH AND CORONARY ANGIOGRAPHY;  Surgeon: Yolonda Kida, MD;  Location: [place] [facility];  Service: Cardiovascular;  Laterality: N/A;   LOWER EXTREMITY ANGIOGRAPHY Left 11/17/2019   Procedure: LOWER EXTREMITY ANGIOGRAPHY;  Surgeon: Algernon Huxley, MD;  Location: [place] [facility];  Service: Cardiovascular;  Laterality: Left;   LOWER EXTREMITY ANGIOGRAPHY Right 12/13/2020   Procedure: LOWER EXTREMITY ANGIOGRAPHY;  Surgeon: Algernon Huxley, MD;  Location: [place] [facility];  Service: Cardiovascular;  Laterality: Right;   LOWER EXTREMITY ANGIOGRAPHY Right 01/10/2021   Procedure: LOWER EXTREMITY ANGIOGRAPHY;  Surgeon: Algernon Huxley, MD;  Location: [place] [facility];  Service: Cardiovascular;  Laterality: Right;   LOWER EXTREMITY ANGIOGRAPHY Right 01/24/2021   Procedure: LOWER EXTREMITY ANGIOGRAPHY;  Surgeon: Algernon Huxley, MD;  Location: [place] [facility];  Service: Cardiovascular;  Laterality: Right;   LOWER EXTREMITY ANGIOGRAPHY Bilateral 06/28/2021   Procedure: Lower Extremity Angiography;  Surgeon: Algernon Huxley, MD;  Location: [place] [facility];  Service: Cardiovascular;  Laterality: Bilateral;   LOWER EXTREMITY ANGIOGRAPHY Right 11/28/2021   Procedure: LOWER EXTREMITY ANGIOGRAPHY;  Surgeon: Algernon Huxley, MD;  Location: [place] [facility];  Service: Cardiovascular;  Laterality: Right;   LOWER EXTREMITY  ANGIOGRAPHY Left 11/30/2021   Procedure: Lower Extremity Angiography;  Surgeon: Algernon Huxley, MD;  Location: [place] [facility];  Service: Cardiovascular;  Laterality: Left;   LOWER EXTREMITY ANGIOGRAPHY Right 02/22/2022   Procedure: Lower Extremity Angiography;  Surgeon: Algernon Huxley, MD;  Location: [place] [facility];  Service: Cardiovascular;  Laterality: Right;   LOWER EXTREMITY ANGIOGRAPHY Right 03/13/2022   Procedure: Lower Extremity Angiography;  Surgeon: Algernon Huxley, MD;  Location: [place] [facility];  Service: Cardiovascular;  Laterality: Right;   LOWER EXTREMITY ANGIOGRAPHY Right 03/24/2022   Procedure: Lower Extremity Angiography;  Surgeon: Algernon Huxley, MD;  Location: [place] [facility];  Service: Cardiovascular;  Laterality: Right;   LOWER EXTREMITY INTERVENTION N/A 11/19/2019   Procedure: LOWER EXTREMITY INTERVENTION;  Surgeon: Algernon Huxley, MD;  Location: [place] [facility];  Service: Cardiovascular;  Laterality: N/A;   LOWER EXTREMITY INTERVENTION Bilateral 06/29/2021   Procedure: LOWER EXTREMITY  INTERVENTION;  Surgeon: Algernon Huxley, MD;  Location: [place] [facility];  Service: Cardiovascular;  Laterality: Bilateral;   TEE WITHOUT CARDIOVERSION N/A 06/22/2017   Procedure: TRANSESOPHAGEAL ECHOCARDIOGRAM (TEE);  Surgeon: Wellington Hampshire, MD;  Location: [facility];  Service: Cardiovascular;  Laterality: N/A;   TOTAL KNEE ARTHROPLASTY Right 07/11/2018   Procedure: TOTAL KNEE ARTHROPLASTY;  Surgeon: Corky Mull, MD;  Location: [facility];  Service: Orthopedics;  Laterality: Right;   VAGINAL HYSTERECTOMY     VISCERAL ARTERY INTERVENTION N/A 06/20/2017   Procedure: Visceral Artery Intervention, possible aortic thrombectomy;  Surgeon: Algernon Huxley, MD;  Location: [place] [facility];  Service: Cardiovascular;  Laterality: N/A;   VISCERAL ARTERY INTERVENTION N/A 01/28/2018   Procedure: VISCERAL ARTERY INTERVENTION;  Surgeon: Algernon Huxley, MD;  Location: [place] [facility];  Service:  Cardiovascular;  Laterality: N/A;    Social History:  reports that she quit smoking about 10 months ago. Her smoking use included cigarettes. She smoked an average of 1 pack per day. She has never used smokeless tobacco. She reports that she does not drink alcohol and does not use drugs.  Family History:  Family History  Problem Relation Age of Onset   Hypertension Mother    Heart disease Mother    Heart attack Mother    Breast cancer Mother    Hypertension Father    Breast cancer Paternal Aunt      Prior to Admission medications   Medication Sig Start Date End Date Taking? Authorizing Provider  alum & mag hydroxide-simeth (MAALOX/MYLANTA) 200-200-20 MG/5ML suspension Take 15-30 mLs by mouth every 2 (two) hours as needed for indigestion. 08/11/22   Lorella Nimrod, MD  apixaban (ELIQUIS) 5 MG TABS tablet Take 1 tablet (5 mg total) by mouth 2 (two) times daily. 01/25/21   Stegmayer, Janalyn Harder, PA-C  aspirin EC 81 MG tablet Take 81 mg by mouth daily. Swallow whole.    [provider]  atorvastatin (LIPITOR) 80 MG tablet Take 80 mg by mouth at bedtime.    [provider]  citalopram (CELEXA) 40 MG tablet Take 1 tablet (40 mg total) by mouth daily. 02/04/22 07/22/22  Enzo Bi, MD  COMBIVENT RESPIMAT 20-100 MCG/ACT AERS respimat  03/27/22   [provider]  docusate sodium (COLACE) 100 MG capsule Take 1 capsule (100 mg total) by mouth daily. 08/12/22   Lorella Nimrod, MD  esomeprazole (NEXIUM) 20 MG capsule Take 20 mg by mouth daily. 07/06/22   [provider]  feeding supplement (ENSURE ENLIVE / ENSURE PLUS) LIQD Take 237 mLs by mouth 2 (two) times daily between meals. 05/06/22   Fritzi Mandes, MD  ferrous sulfate 325 (65 FE) MG tablet Take 325 mg by mouth daily.    [provider]  furosemide (LASIX) 40 MG tablet Take 0.5 tablets (20 mg total) by mouth daily. 08/11/22   Lorella Nimrod, MD  HYDROcodone-acetaminophen (NORCO/VICODIN) 5-325 MG tablet Take 1-2  tablets by mouth every 4 (four) hours as needed for severe pain or moderate pain (1 pill for moderate pain, 2 for severe). 08/11/22   Lorella Nimrod, MD  isosorbide mononitrate (IMDUR) 60 MG 24 hr tablet Take 60 mg by mouth daily. 01/23/22   [provider]  lidocaine (LIDODERM) 5 % Place 1 patch onto the skin daily. Remove & Discard patch within 12 hours or as directed by MD 08/11/22   Lorella Nimrod, MD  metoprolol tartrate (LOPRESSOR) 25 MG tablet Take 0.5 tablets (12.5 mg total)  by mouth 2 (two) times daily. 08/11/22   Lorella Nimrod, MD  midodrine (PROAMATINE) 5 MG tablet Take 1 tablet (5 mg total) by mouth 3 (three) times daily with meals. 08/12/22   Lorella Nimrod, MD  morphine (MS CONTIN) 15 MG 12 hr tablet Take 1 tablet (15 mg total) by mouth every 12 (twelve) hours. 08/11/22   Lorella Nimrod, MD  Multiple Vitamin (MULTIVITAMIN WITH MINERALS) TABS tablet Take 1 tablet by mouth daily. 03/25/22   Fritzi Mandes, MD  ondansetron (ZOFRAN) 4 MG tablet Take 4 mg by mouth every 8 (eight) hours as needed for nausea or vomiting.    [provider]  polyethylene glycol (MIRALAX / GLYCOLAX) 17 g packet Take 17 g by mouth daily. 08/12/22   Lorella Nimrod, MD  potassium chloride SA (KLOR-CON M) 20 MEQ tablet Take 1-2 tablets (20-40 mEq total) by mouth daily as needed (low potassium level). 08/11/22   Lorella Nimrod, MD  pregabalin (LYRICA) 150 MG capsule Take 1 capsule (150 mg total) by mouth 3 (three) times daily. 08/11/22   Lorella Nimrod, MD    Physical Exam: Vitals:   08/26/22 1330 08/26/22 1400 08/26/22 1447 08/26/22 1756  BP: (!) 113/58 (!) 101/54 116/68 106/71  Pulse: 70 73 67 78  Resp: '10 11 16 18  '$ Temp:  97.8 F (36.6 C) 98.2 F (36.8 C) 97.7 F (36.5 C)  TempSrc:      SpO2: 97% 95% 96% 94%  Weight:   102.6 kg   Height:   '5\' 2"'$  (1.575 m)    General: Not in acute distress HEENT:       Eyes: PERRL, EOMI, no scleral icterus.       ENT: No discharge from the ears and nose, no  pharynx injection, no tonsillar enlargement.        Neck: No JVD, no bruit, no mass felt. Heme: No neck lymph node enlargement. Cardiac: S1/S2, RRR, No murmurs, No gallops or rubs. Respiratory: No rales, wheezing, rhonchi or rubs. GI: Soft, nondistended, nontender, no rebound pain, no organomegaly, BS present. GU: No hematuria Ext: s/p of right AKA, with erythema, tenderness, warmth in the stump area Musculoskeletal: No joint deformities, No joint redness or warmth, no limitation of ROM in spin. Skin: No rashes.  Neuro: Alert, oriented X3, cranial nerves II-XII grossly intact, moves all extremities normally.  Psych: Patient is not psychotic, no suicidal or hemocidal ideation.  Labs on Admission: I have personally reviewed following labs and imaging studies  CBC: Recent Labs  Lab 08/26/22 0810  WBC 7.2  NEUTROABS 4.0  HGB 10.7*  HCT 33.6*  MCV 91.3  PLT 161   Basic Metabolic Panel: Recent Labs  Lab 08/26/22 0810 08/26/22 1303  NA 136  --   K 3.1*  --   CL 95*  --   CO2 31  --   GLUCOSE 117*  --   BUN 19  --   CREATININE 0.83  --   CALCIUM 9.1  --   MG  --  2.0   GFR: Estimated Creatinine Clearance: 78.9 mL/min (by C-G formula based on SCr of 0.83 mg/dL). Liver Function Tests: No results for input(s): "AST", "ALT", "ALKPHOS", "BILITOT", "PROT", "ALBUMIN" in the last 168 hours. No results for input(s): "LIPASE", "AMYLASE" in the last 168 hours. No results for input(s): "AMMONIA" in the last 168 hours. Coagulation Profile: Recent Labs  Lab 08/26/22 1316  INR 1.3*   Cardiac Enzymes: No results for input(s): "CKTOTAL", "CKMB", "CKMBINDEX", "TROPONINI" in the  last 168 hours. BNP (last 3 results) No results for input(s): "PROBNP" in the last 8760 hours. HbA1C: No results for input(s): "HGBA1C" in the last 72 hours. CBG: No results for input(s): "GLUCAP" in the last 168 hours. Lipid Profile: No results for input(s): "CHOL", "HDL", "LDLCALC", "TRIG", "CHOLHDL",  "LDLDIRECT" in the last 72 hours. Thyroid Function Tests: No results for input(s): "TSH", "T4TOTAL", "FREET4", "T3FREE", "THYROIDAB" in the last 72 hours. Anemia Panel: No results for input(s): "VITAMINB12", "FOLATE", "FERRITIN", "TIBC", "IRON", "RETICCTPCT" in the last 72 hours. Urine analysis:    Component Value Date/Time   COLORURINE AMBER (A) 03/22/2022 1520   APPEARANCEUR CLOUDY (A) 03/22/2022 1520   APPEARANCEUR Clear 01/19/2015 1112   LABSPEC 1.016 03/22/2022 1520   LABSPEC 1.019 01/19/2015 1112   PHURINE 6.0 03/22/2022 1520   GLUCOSEU NEGATIVE 03/22/2022 1520   GLUCOSEU Negative 01/19/2015 1112   HGBUR LARGE (A) 03/22/2022 1520   BILIRUBINUR NEGATIVE 03/22/2022 1520   BILIRUBINUR Negative 01/19/2015 1112   KETONESUR NEGATIVE 03/22/2022 1520   PROTEINUR 30 (A) 03/22/2022 1520   NITRITE POSITIVE (A) 03/22/2022 1520   LEUKOCYTESUR LARGE (A) 03/22/2022 1520   LEUKOCYTESUR Negative 01/19/2015 1112   Sepsis Labs: '@LABRCNTIP'$ (procalcitonin:4,lacticidven:4) ) Recent Results (from the past 240 hour(s))  SARS Coronavirus 2 by RT PCR (hospital order, performed in [place] hospital lab) *cepheid single result test* Anterior Nasal Swab     Status: None   Collection Time: 08/26/22  1:03 PM   Specimen: Anterior Nasal Swab  Result Value Ref Range Status   SARS Coronavirus 2 by RT PCR NEGATIVE NEGATIVE Final    Comment: (NOTE) SARS-CoV-2 target nucleic acids are NOT DETECTED.  The SARS-CoV-2 RNA is generally detectable in upper and lower respiratory specimens during the acute phase of infection. The lowest concentration of SARS-CoV-2 viral copies this assay can detect is 250 copies / mL. A negative result does not preclude SARS-CoV-2 infection and should not be used as the sole basis for treatment or other patient management decisions.  A negative result may occur with improper specimen collection / handling, submission of specimen other than nasopharyngeal swab, presence of  viral mutation(s) within the areas targeted by this assay, and inadequate number of viral copies (<250 copies / mL). A negative result must be combined with clinical observations, patient history, and epidemiological information.  Fact Sheet for Patients:   https://www.patel.info/  Fact Sheet for Healthcare Providers: https://hall.com/  This test is not yet approved or  cleared by the [place] FDA and has been authorized for detection and/or diagnosis of SARS-CoV-2 by FDA under an Emergency Use Authorization (EUA).  This EUA will remain in effect (meaning this test can be used) for the duration of the COVID-19 declaration under Section 564(b)(1) of the Act, 21 U.S.C. section 360bbb-3(b)(1), unless the authorization is terminated or revoked sooner.  Performed at [facility], [address] [place], [place] [address]      Radiological Exams on Admission: CT FEMUR RIGHT W CONTRAST  Result Date: 08/26/2022 CLINICAL DATA:  Right leg pain.  History of above knee amputation EXAM: CT OF THE LOWER RIGHT EXTREMITY WITH CONTRAST TECHNIQUE: Multidetector CT imaging of the lower right extremity was performed according to the standard protocol following intravenous contrast administration. RADIATION DOSE REDUCTION: This exam was performed according to the departmental dose-optimization program which includes automated exposure control, adjustment of the mA and/or kV according to patient size and/or use of iterative reconstruction technique. CONTRAST:  122m OMNIPAQUE IOHEXOL 300 MG/ML  SOLN  COMPARISON:  X-ray 08/26/2022, CT 07/30/2022 FINDINGS: Bones/Joint/Cartilage Postsurgical changes of above knee amputation of the right leg. Stippled appearance of the anterior greater than posterior cortex of the distal femoral diaphysis is unchanged in appearance compared to the prior CT and may be related to demineralization (series 4, image 309).  Erosive/destructive changes is felt to be less likely given the lack of interval change. Amputation margin remains sharp. Proximal femur is intact without fracture or dislocation. The visualized pelvis is intact without fracture or diastasis. No lytic or sclerotic bone lesion. Ligaments Suboptimally assessed by CT. Muscles and Tendons Amputation changes of the distal thigh musculotendinous structures with associated muscle atrophy. Soft tissues Complex mixed attenuation fluid collection surrounding the distal femoral resection margin has increased in size from prior, now measuring 7.0 x 6.0 x 6.7 cm (series 3, image 323), previously measured 4.5 x 2.7 x 2.3 cm. Collection demonstrates thick peripheral enhancement. No internal soft tissue gas. Relative lack of stranding/inflammatory changes within the adjacent subcutaneous fat. No right inguinal lymphadenopathy. Large ventral abdominal wall hernia containing non compromised appearing loops of large and small bowel. Partially imaged aortoiliac stent graft. IMPRESSION: 1. Postsurgical changes of above knee amputation of the right leg. 2. Complex mixed attenuation fluid collection surrounding the distal femoral resection margin has increased in size from prior, now measuring 7.0 x 6.0 x 6.7 cm, previously measured 4.5 x 2.7 x 2.3 cm. Appearance remains nonspecific and could represent an adventitial bursa, which could be sterile or infected. Abscess is also a concern, although there is relatively little surrounding inflammatory changes 3. Stippled appearance of the distal femoral diaphysis is unchanged in appearance compared to the prior CT and may be related to disuse demineralization. Erosive/destructive changes is felt to be less likely given the lack of interval change. 4. Large ventral abdominal wall hernia containing multiple non-compromised appearing loops of large and small bowel. Electronically Signed   By: Davina Poke D.O.   On: 08/26/2022 10:08   DG  Femur Min 2 Views Right  Result Date: 08/26/2022 CLINICAL DATA:  RIGHT leg/stump pain.  History of AKA in June 2023. EXAM: RIGHT FEMUR 2 VIEWS COMPARISON:  07/30/2022 and prior studies FINDINGS: AKA changes again noted. There is no evidence of acute fracture, subluxation, dislocation or definite radiographic evidence of osteomyelitis. No definite gas in the soft tissues noted. No unexpected radiopaque foreign bodies noted. IMPRESSION: AKA changes without acute abnormality. If there is strong clinical suspicion for acute osteomyelitis, recommend MRI. Electronically Signed   By: Margarette Canada M.D.   On: 08/26/2022 08:47      Assessment/Plan Principal Problem:   Abscess of knee, right Active Problems:   Chronic mesenteric ischemia (HCC)   PAD (peripheral artery disease) (HCC)   CAD (coronary artery disease)   Chronic diastolic CHF (congestive heart failure) (HCC)   PAF (paroxysmal atrial fibrillation) (HCC)   COPD (chronic obstructive pulmonary disease) (HCC)   HLD (hyperlipidemia)   Recurrent major depressive disorder (HCC)   Chronic pain   Hypokalemia   Iron deficiency anemia   Morbid obesity with BMI of 40.0-44.9, adult ([place])   Assessment and Plan:  Abscess of knee, right: CT scan showed increased size of collection.  Patient does not have fever or leukocytosis.  Does not meet criteria for sepsis.  Consulted Dr. Pascal Lux of IR for possible drainage, but he recommended surgical debridement.  Consulted Dr. Trula Slade of vascular surgeon.   -Admitted to telemetry bed as inpatient -Vancomycin and Rocephin (patient received 1 dose  of Zosyn in ED) -blood culture -pain control: Continue home MS Contin and Norco, start as needed morphine  Chronic mesenteric ischemia -switched Eliquis to IV heparin  PAD (peripheral artery disease) [facility]): s/p of left axillary femoral bypass graft, -Aspirin, Lipitor  CAD (coronary artery disease): no CP -Aspirin and Lipitor, imdur  Chronic diastolic CHF  (congestive heart failure) ([place]): 2D echo on 06/15/2018 showed EF of 60 to 65%.  Patient does not have shortness breath.  No signs of CHF exacerbation. -Continue home Lasix 20 mg daily and metolazone 2.5 mg daily  PAF (paroxysmal atrial fibrillation) (HCC) -Switch Eliquis to IV heparin as above -Metoprolol  COPD (chronic obstructive pulmonary disease) (HCC): Stable -Bronchodilators  HLD (hyperlipidemia) -Lipitor  Recurrent major depressive disorder ([place]) -Continue home medications  Chronic pain: -Continue home MS Contin and Norco  Hypokalemia: Potassium 3.1 -Repleted potassium -Check a magnesium level  Iron deficiency anemia: Hemoglobin stable.  10.7 (8.1 on 08/09/2022) -Continue home iron supplement  Morbid obesity with BMI of 40.0-44.9, adult [facility]):  BMI= 41.37  and BW= 102.6 Kg -Diet and exercise.   -Encouraged to lose weight.      DVT ppx: on IV  Heparin    Code Status: Full code  Family Communication: not done, no family member is at bed side.   Disposition Plan:  Anticipate discharge back to previous environment  Consults called:  VVS, Dr. Trula Slade  Admission status and Level of care: Telemetry Medical:    as inpt        Dispo: The patient is from: SNF              Anticipated d/c is to: SNF              Anticipated d/c date is: 2 days              Patient currently is not medically stable to d/c.    Severity of Illness:  The appropriate patient status for this patient is INPATIENT. Inpatient status is judged to be reasonable and necessary in order to provide the required intensity of service to ensure the patient's safety. The patient's presenting symptoms, physical exam findings, and initial radiographic and laboratory data in the context of their chronic comorbidities is felt to place them at high risk for further clinical deterioration. Furthermore, it is not anticipated that the patient will be medically stable for discharge from the hospital within  2 midnights of admission.   * I certify that at the point of admission it is my clinical judgment that the patient will require inpatient hospital care spanning beyond 2 midnights from the point of admission due to high intensity of service, high risk for further deterioration and high frequency of surveillance required.*       Date of Service 08/26/2022    Ivor [facility]   If 7PM-7AM, please contact night-coverage [email or web address] 08/26/2022, 6:40 PM

## 2022-08-26 NOTE — ED Notes (Signed)
Pt in CT.

## 2022-08-26 NOTE — ED Notes (Signed)
Pt requesting dilaudid. EDP aware.

## 2022-08-26 NOTE — ED Notes (Signed)
Secretary to call for transport.

## 2022-08-26 NOTE — Progress Notes (Signed)
[place] for heparin infusion initiation and monitoring Indication: hx of superior mesenteric artery thrombosis and A fib   Allergies  Allergen Reactions   Gabapentin     Tremors    Bactrim [Sulfamethoxazole-Trimethoprim] Itching    Patient Measurements: Height: '5\' 2"'$  (157.5 cm) Weight: 103.9 kg (229 lb) IBW/kg (Calculated) : 50.1 Heparin Dosing Weight: 75 kg  Vital Signs: Temp: 98 F (36.7 C) (11/04 0803) Temp Source: Oral (11/04 0803) BP: 113/67 (11/04 0930) Pulse Rate: 83 (11/04 0930)  Labs: Recent Labs    08/26/22 0810  HGB 10.7*  HCT 33.6*  PLT 232  CREATININE 0.83    Estimated Creatinine Clearance: 79.4 mL/min (by C-G formula based on SCr of 0.83 mg/dL).   Medical History: Past Medical History:  Diagnosis Date   Abdominal aortic atherosclerosis ([place])    a. 05/2017 CTA abd/pelvis: significant atherosclerotic dzs of the infrarenal abd Ao w/ some mural thrombus. No aneurysm or dissection.   Acute focal ischemia of small intestine (HCC)    Acute right-sided low back pain with right-sided sciatica 06/13/2017   AKI (acute kidney injury) ([place]) 07/29/2017   Anemia    Anginal pain ([place])    a. 08/2012 Lexiscan MV: EF 54%, non ischemia/infarct.   Anxiety and depression    Baker's cyst of knee, right    a. 07/2016 U/S: 4.1 x 1.4 x 2.9 cystic structure in R poplitetal fossa.   Bell's palsy    CAD (coronary artery disease)    CHF (congestive heart failure) ([place])    a.) TTE 06/20/2017: EF 60-65%, no rwma, G1DD, no source of cardiac emboli.   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 05/2017 CTA Abd/pelvis: apparent thrombus or embolus in prox SMA (70-90%); b. 05/2017 catheter directed tPA, mechanical thrombectomy, and stenting of  the SMA.   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 b.) rate/rhythm maintained on oral metoprolol tartrate; chronically anticoagulated with standard dose apixaban.   Palpitations    Peripheral edema    Personal history of other malignant neoplasm of skin 06/01/2016   Pneumonia    Pre-diabetes    Primary osteoarthritis of both knees 06/13/2017   SBO (small bowel obstruction) ([place]) 08/06/2017   Skin cancer of nose    Superior mesenteric artery thrombosis (HCC)    Vitamin D deficiency     Assessment: 61 y.o. female with medical history significant for peripheral arterial disease s/p right AKA, chronic diastolic dysfunction CHF, anxiety and depression, COPD, paroxysmal A-fib on chronic anticoagulation therapy, who  presents to the ED with  increasing pain in her right AKA stump. A review of medical records reveals apixaban therapy prior to arrival (unknown last dose)   Goal of Therapy:  Heparin level 0.3-0.7 units/ml aPTT 66 - 102 seconds Monitor platelets by anticoagulation protocol: Yes   Plan:  Give 4000 units bolus x 1 Start heparin infusion at 1400 units/hr Check aPTT level in 6 hours (we will use aPTT levels given h/o apixaban until heparin level and aPTT correlate) and daily while on heparin Continue  to monitor H&H and platelets  Dallie Piles 08/26/2022,11:42 AM

## 2022-08-26 NOTE — Plan of Care (Signed)

## 2022-08-26 NOTE — ED Notes (Signed)
Inpatient RN informed of APTT result.  Will notify pharmacy and hospitalist.  Blue top was drawn prior to Heparin being started.

## 2022-08-27 DIAGNOSIS — L02415 Cutaneous abscess of right lower limb: Secondary | ICD-10-CM | POA: Diagnosis not present

## 2022-08-27 LAB — CBC
HCT: 33 % — ABNORMAL LOW (ref 36.0–46.0)
Hemoglobin: 10.4 g/dL — ABNORMAL LOW (ref 12.0–15.0)
MCH: 29 pg (ref 26.0–34.0)
MCHC: 31.5 g/dL (ref 30.0–36.0)
MCV: 91.9 fL (ref 80.0–100.0)
Platelets: 238 10*3/uL (ref 150–400)
RBC: 3.59 MIL/uL — ABNORMAL LOW (ref 3.87–5.11)
RDW: 17.3 % — ABNORMAL HIGH (ref 11.5–15.5)
WBC: 5.9 10*3/uL (ref 4.0–10.5)
nRBC: 0 % (ref 0.0–0.2)

## 2022-08-27 LAB — APTT: aPTT: 85 seconds — ABNORMAL HIGH (ref 24–36)

## 2022-08-27 LAB — BASIC METABOLIC PANEL
Anion gap: 13 (ref 5–15)
BUN: 18 mg/dL (ref 8–23)
CO2: 26 mmol/L (ref 22–32)
Calcium: 9.2 mg/dL (ref 8.9–10.3)
Chloride: 100 mmol/L (ref 98–111)
Creatinine, Ser: 0.73 mg/dL (ref 0.44–1.00)
GFR, Estimated: >60 mL/min (ref >60)
Glucose, Bld: 127 mg/dL — ABNORMAL HIGH (ref 70–99)
Potassium: 3.7 mmol/L (ref 3.5–5.1)
Sodium: 139 mmol/L (ref 135–145)

## 2022-08-27 LAB — GLUCOSE, CAPILLARY: Glucose-Capillary: 105 mg/dL — ABNORMAL HIGH (ref 70–99)

## 2022-08-27 LAB — HEPARIN LEVEL (UNFRACTIONATED): Heparin Unfractionated: >1.1 IU/mL — ABNORMAL HIGH (ref 0.30–0.70)

## 2022-08-27 MED ORDER — DIPHENHYDRAMINE HCL 25 MG PO CAPS: 25.0000 mg | ORAL_CAPSULE | Freq: Four times a day (QID) | ORAL | Status: DC | PRN

## 2022-08-27 MED ORDER — VANCOMYCIN HCL IN DEXTROSE 1-5 GM/200ML-% IV SOLN
1000.0000 mg | Freq: Two times a day (BID) | INTRAVENOUS | Status: DC
  Administered 2022-08-27 – 2022-08-28 (×2): 1000 mg via INTRAVENOUS
  Filled 2022-08-27 (×3): qty 200

## 2022-08-27 NOTE — Hospital Course (Addendum)
Carla [name] is a 61 y.o. female with medical history significant of PAD status post right AKA and left axillary femoral bypass graft, diastolic CHF, COPD, and superior mesenteric artery thrombus and atrial fibrillation on Eliquis, hypertension, hyperlipidemia, depression, CAD, obesity, chronic pain, anemia, right knee abscess at AKA site, who present with increasing pain in right AKA stump. Pt had abscess of right knee at AKA site recently. She completed a course of Abx. Now comes back with worsening pain in that site in the past several days. Patient also has erythema at right AKA stump site 11/04: in ED - WBC 7.2, potassium 3.1, GFR> 60, temperature normal, blood pressure 113/67, heart rate 83, RR 24, oxygen saturation 93% on room air.  Negative COVID PCR.  X-ray of left femur is negative for bony fracture. CT R femur (+) "Complex mixed attenuation fluid collection surrounding the distal femoral resection margin has increased in size from prior... nonspecific and could represent an adventitial bursa, which could be sterile or infected. Abscess is also a concern, although there is relatively little surrounding inflammatory changes." Patient is admitted to telemetry bed as inpatient. VVS, Dr. Trula Slade is consulted.  11/05: no concerns on labs/VS. Vascular surgery planning I&D tomorrow 11/06  Consultants:  Vein/Vascular Surgery   Procedures: planning I&D tomorrow 11/06      ASSESSMENT & PLAN:   Principal Problem:   Abscess of knee, right Active Problems:   Chronic mesenteric ischemia (HCC)   PAD (peripheral artery disease) (HCC)   CAD (coronary artery disease)   Chronic diastolic CHF (congestive heart failure) (HCC)   PAF (paroxysmal atrial fibrillation) (HCC)   COPD (chronic obstructive pulmonary disease) (HCC)   HLD (hyperlipidemia)   Recurrent major depressive disorder (HCC)   Chronic pain   Hypokalemia   Iron deficiency anemia   Morbid obesity with BMI of 40.0-44.9, adult  ([place])  S/p R BKA Pain in stump, fluid collection on CT concern for possible abscess of knee, right Does not meet criteria for sepsis.   Admitting physician consulted Dr. Pascal Lux of IR for possible drainage, but he recommended surgical debridement.   Admitting physician consulted Dr. Trula Slade of vascular surgery --> planning I&D tomorrow 11/06 Admitted to telemetry bed as inpatient Vancomycin and Rocephin (patient received 1 dose of Zosyn in ED) blood culture pain control: Continue home MS Contin and Norco, start as needed morphine   Chronic mesenteric ischemia switched Eliquis to IV heparin   PAD (peripheral artery disease) [facility]):  s/p of left axillary femoral bypass graft, Aspirin, Lipitor   CAD (coronary artery disease):  no CP Aspirin and Lipitor, imdur   Chronic diastolic CHF (congestive heart failure) ([place]):  2D echo on 06/15/2018 showed EF of 60 to 65%.   No signs of CHF exacerbation. Continue home Lasix 20 mg daily and metolazone 2.5 mg daily   PAF (paroxysmal atrial fibrillation) (HCC) Switch Eliquis to IV heparin as above Metoprolol   COPD (chronic obstructive pulmonary disease) (HCC): Stable Bronchodilators   HLD (hyperlipidemia) Lipitor   Recurrent major depressive disorder ([place]) Continue home medications   Chronic pain: Continue home MS Contin and Norco   Hypokalemia - resolved Potassium 3.1 on admission Repleted potassium Check a magnesium level --> WNL Follow BMP   Iron deficiency anemia:  Hemoglobin stable.  10.7 (8.1 on 08/09/2022) Continue home iron supplement   Morbid obesity with BMI of 40.0-44.9, adult [facility]):   BMI= 41.37  and BW= 102.6 Kg Diet and exercise.   Encouraged to lose weight.  DVT prophylaxis: heparin (pending possible surgical intervention)  Pertinent IV fluids/nutrition: no conitnuous IV fluids Central lines / invasive devices: none  Code Status: FULL CODE Family Communication: none at this time   Disposition:  anticipate d/c back to previous SNF TOC needs: confirm SNF on eventual discharge  Barriers to discharge / significant pending items: pending VVS possible intervention, on IV abx pending BCx, anticipate d/c in 1-2 days

## 2022-08-27 NOTE — Progress Notes (Signed)
[place] for heparin infusion initiation and monitoring Indication: hx of superior mesenteric artery thrombosis and A fib   Allergies  Allergen Reactions   Gabapentin     Tremors    Bactrim [Sulfamethoxazole-Trimethoprim] Itching    Patient Measurements: Height: '5\' 2"'$  (157.5 cm) Weight: 101.7 kg (224 lb 3.3 oz) IBW/kg (Calculated) : 50.1 Heparin Dosing Weight: 75 kg  Vital Signs: Temp: 97.9 F (36.6 C) (11/04 2248) BP: 115/81 (11/04 2248) Pulse Rate: 87 (11/04 2248)  Labs: Recent Labs    08/26/22 0810 08/26/22 1316 08/26/22 1922 08/27/22 0533  HGB 10.7*  --   --  10.4*  HCT 33.6*  --   --  33.0*  PLT 232  --   --  238  APTT  --  >200* 83* 85*  LABPROT  --  16.2*  --   --   INR  --  1.3*  --   --   HEPARINUNFRC  --  >1.10*  --  >1.10*  CREATININE 0.83  --   --  0.73     Estimated Creatinine Clearance: 81.4 mL/min (by C-G formula based on SCr of 0.73 mg/dL).   Medical History: Past Medical History:  Diagnosis Date   Abdominal aortic atherosclerosis ([place])    a. 05/2017 CTA abd/pelvis: significant atherosclerotic dzs of the infrarenal abd Ao w/ some mural thrombus. No aneurysm or dissection.   Acute focal ischemia of small intestine (HCC)    Acute right-sided low back pain with right-sided sciatica 06/13/2017   AKI (acute kidney injury) ([place]) 07/29/2017   Anemia    Anginal pain ([place])    a. 08/2012 Lexiscan MV: EF 54%, non ischemia/infarct.   Anxiety and depression    Baker's cyst of knee, right    a. 07/2016 U/S: 4.1 x 1.4 x 2.9 cystic structure in R poplitetal fossa.   Bell's palsy    CAD (coronary artery disease)    CHF (congestive heart failure) ([place])    a.) TTE 06/20/2017: EF 60-65%, no rwma, G1DD, no source of cardiac emboli.   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 05/2017 CTA Abd/pelvis: apparent thrombus or embolus in prox SMA (70-90%); b. 05/2017 catheter directed tPA, mechanical thrombectomy, and stenting of the SMA.   Essential hypertension with goal blood pressure less than 140/90 01/19/2016   Gastroesophageal reflux disease 01/19/2016   GERD (gastroesophageal reflux disease)    H/O colectomy    History of 2019 novel coronavirus disease (COVID-19) 11/14/2019   History of kidney stones    Hyperlipidemia    Hypertension    Morbid obesity with BMI of 40.0-44.9, adult ([place])    NSTEMI (non-ST elevated myocardial infarction) ([place]) 06/27/2021   a.) high sensitivity troponins trended: 893 --> 1587 --> 1748 --> 868 --> 735 ng/L   Occlusive mesenteric ischemia ([place]) 06/19/2017   Osteoarthritis    PAD (peripheral artery disease) (HCC)    PAF (paroxysmal atrial fibrillation) (HCC)    a.) CHA2DS2-VASc = 6 (sex, CHF, HTN, DVT x2, aortic plaque). b.) rate/rhythm maintained on oral metoprolol tartrate; chronically anticoagulated with standard dose apixaban.   Palpitations    Peripheral edema    Personal history of other malignant neoplasm of skin 06/01/2016   Pneumonia  Pre-diabetes    Primary osteoarthritis of both knees 06/13/2017   SBO (small bowel obstruction) ([place]) 08/06/2017   Skin cancer of nose    Superior mesenteric artery thrombosis (HCC)    Vitamin D deficiency     Assessment: 61 y.o. female with medical history significant for peripheral arterial disease s/p right AKA, chronic diastolic dysfunction CHF, anxiety and depression, COPD, paroxysmal A-fib on chronic anticoagulation therapy, who presents to the ED with  increasing pain in her right AKA stump. A review of medical records reveals apixaban therapy prior to arrival (unknown last dose)   Goal of Therapy:  Heparin level 0.3-0.7 units/ml aPTT 66 - 102 seconds Monitor platelets by anticoagulation protocol: Yes    Date/Time  aPTT/HL  Comment 11/04'@1922'$   aPTT=83  Therapeutic x1  at 1400 un/hr 11/05 0533  85 / >1.1  aPTT therapeutic x 2  Plan:  Continue hepatin infusion at 1400 units/hr Recheck aPTT daily w/ AM labs while therapeutic Recheck HL daily until correlation w/ aPTT confirmed Continue to monitor H&H and platelets  Renda Rolls, PharmD, [facility] 08/27/2022 6:34 AM

## 2022-08-27 NOTE — Consult Note (Signed)
[facility] [place]  Patient name: Carla [name] MRN: [id number] DOB: 09-24-1961 Sex: female   REQUESTING PROVIDER:    Hospitalists   REASON FOR CONSULT:    Right knee pain  HISTORY OF PRESENT ILLNESS:   Carla [name] is a 61 y.o. female, with an extensive vascular history, having undergone extensive efforts for revascularization in the past.  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  She was most recently discharged on October 20 after a protracted hospital stay.  She returned to the ER because of severe pain in her right above-knee amputation.  She had a CT scan that showed a enlarging fluid collection concerning for abscess.  Her white count has been normal.  There is no significant stranding around the area surrounding her distal right femur.  We have been asked to assist with management.  PAST MEDICAL HISTORY    Past Medical History:  Diagnosis Date   Abdominal aortic atherosclerosis ([place])    a. 05/2017 CTA abd/pelvis: significant atherosclerotic dzs of the infrarenal abd Ao w/ some mural thrombus. No aneurysm or dissection.   Acute focal ischemia of small intestine (HCC)    Acute right-sided low back pain with right-sided sciatica 06/13/2017   AKI (acute kidney injury) ([place]) 07/29/2017   Anemia    Anginal pain ([place])    a. 08/2012 Lexiscan MV: EF 54%, non ischemia/infarct.   Anxiety and depression    Baker's cyst of knee, right    a. 07/2016 U/S: 4.1 x 1.4 x 2.9 cystic structure in R poplitetal fossa.   Bell's palsy    CAD (coronary artery disease)    CHF (congestive heart failure) ([place])    a.) TTE 06/20/2017: EF 60-65%, no rwma, G1DD, no source of cardiac emboli.   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 05/2017 CTA Abd/pelvis: apparent thrombus or embolus in prox SMA (70-90%); b. 05/2017 catheter directed tPA, mechanical thrombectomy, and stenting of the SMA.   Essential hypertension with goal blood pressure less than 140/90 01/19/2016   Gastroesophageal reflux disease 01/19/2016   GERD (gastroesophageal reflux disease)    H/O colectomy    History of 2019 novel coronavirus disease (COVID-19) 11/14/2019   History of kidney stones    Hyperlipidemia    Hypertension    Morbid obesity with BMI of 40.0-44.9, adult ([place])    NSTEMI (non-ST elevated myocardial infarction) ([place]) 06/27/2021   a.) high sensitivity troponins trended: 893 --> 1587 --> 1748 --> 868 --> 735 ng/L   Occlusive mesenteric ischemia ([place]) 06/19/2017   Osteoarthritis    PAD (peripheral artery disease) (HCC)    PAF (paroxysmal atrial fibrillation) (HCC)    a.) CHA2DS2-VASc = 6 (sex, CHF, HTN, DVT x2, aortic plaque). b.) rate/rhythm maintained on oral metoprolol tartrate; chronically anticoagulated with standard dose apixaban.   Palpitations    Peripheral edema    Personal history of other malignant neoplasm of skin 06/01/2016   Pneumonia    Pre-diabetes    Primary osteoarthritis of both knees 06/13/2017   SBO (small bowel obstruction) ([place]) 08/06/2017   Skin cancer of nose    Superior mesenteric artery thrombosis (HCC)  Vitamin D deficiency      FAMILY HISTORY   Family History  Problem Relation Age of Onset   Hypertension Mother    Heart disease Mother    Heart attack Mother    Breast cancer Mother    Hypertension Father    Breast cancer Paternal Aunt     SOCIAL HISTORY:   Social History   Socioeconomic History   Marital status: Single    Spouse name: Not on file   Number of children: Not on file   Years of education: Not on file   Highest education level: Not on file  Occupational History   Occupation:  Unemployed  Tobacco Use   Smoking status: Former    Packs/day: 1.00    Types: Cigarettes    Quit date: 2023    Years since quitting: 0.8   Smokeless tobacco: Never  Vaping Use   Vaping Use: Never used  Substance and Sexual Activity   Alcohol use: No   Drug use: No   Sexual activity: Not on file  Other Topics Concern   Not on file  Social History Narrative   Lives in [place] with daughter   Social Determinants of Health   Financial Resource Strain: Not on file  Food Insecurity: No Food Insecurity (08/26/2022)   Hunger Vital Sign    Worried About Running Out of Food in the Last Year: Never true    Ran Out of Food in the Last Year: Never true  Transportation Needs: No Transportation Needs (08/26/2022)   PRAPARE - Hydrologist (Medical): No    Lack of Transportation (Non-Medical): No  Physical Activity: Not on file  Stress: Not on file  Social Connections: Not on file  Intimate Partner Violence: Not At Risk (08/26/2022)   Humiliation, Afraid, Rape, and Kick questionnaire    Fear of Current or Ex-Partner: No    Emotionally Abused: No    Physically Abused: No    Sexually Abused: No    ALLERGIES:    Allergies  Allergen Reactions   Gabapentin     Tremors    Bactrim [Sulfamethoxazole-Trimethoprim] Itching    CURRENT MEDICATIONS:    Current Facility-Administered Medications  Medication Dose Route Frequency Provider Last Rate Last Admin   0.9 %  sodium chloride infusion   Intravenous PRN Ivor Costa, MD   Stopped at 08/26/22 2254   acetaminophen (TYLENOL) tablet 650 mg  650 mg Oral Q6H PRN Ivor Costa, MD       albuterol (PROVENTIL) (2.5 MG/3ML) 0.083% nebulizer solution 2.5 mg  2.5 mg Inhalation Q4H PRN Ivor Costa, MD       alum & mag hydroxide-simeth (MAALOX/MYLANTA) 200-200-20 MG/5ML suspension 15-30 mL  15-30 mL Oral Q2H PRN Ivor Costa, MD       aspirin EC tablet 81 mg  81 mg Oral Daily Ivor Costa, MD   81 mg at 08/27/22 6578    atorvastatin (LIPITOR) tablet 80 mg  80 mg Oral QHS Ivor Costa, MD   80 mg at 08/26/22 2249   cefTRIAXone (ROCEPHIN) 2 g in sodium chloride 0.9 % 100 mL IVPB  2 g Intravenous Q24H Ivor Costa, MD   Stopped at 08/26/22 2040   citalopram (CELEXA) tablet 40 mg  40 mg Oral Daily Ivor Costa, MD   40 mg at 08/27/22 4696   dextromethorphan-guaiFENesin (MUCINEX DM) 30-600 MG per 12 hr tablet 1 tablet  1 tablet Oral BID PRN Ivor Costa, MD  diphenhydrAMINE (BENADRYL) capsule 25 mg  25 mg Oral Q6H PRN Emeterio Reeve, DO       docusate sodium (COLACE) capsule 100 mg  100 mg Oral Daily PRN Ivor Costa, MD   100 mg at 08/27/22 0912   ferrous sulfate tablet 325 mg  325 mg Oral Daily Ivor Costa, MD   325 mg at 08/27/22 0912   furosemide (LASIX) tablet 20 mg  20 mg Oral Daily Ivor Costa, MD   20 mg at 08/27/22 0912   heparin ADULT infusion 100 units/mL (25000 units/251m)  1,400 Units/hr Intravenous Continuous GDallie Piles RPH 14 mL/hr at 08/27/22 0310 1,400 Units/hr at 08/27/22 0310   hydrALAZINE (APRESOLINE) injection 5 mg  5 mg Intravenous Q2H PRN NIvor Costa MD       HYDROcodone-acetaminophen (NORCO/VICODIN) 5-325 MG per tablet 1-2 tablet  1-2 tablet Oral Q4H PRN NIvor Costa MD   2 tablet at 08/27/22 0956   Ipratropium-Albuterol (COMBIVENT) respimat 1 puff  1 puff Inhalation Q6H NIvor Costa MD   1 puff at 08/27/22 0740   isosorbide mononitrate (IMDUR) 24 hr tablet 60 mg  60 mg Oral Daily NIvor Costa MD   60 mg at 08/27/22 0911   lidocaine (LIDODERM) 5 % 1 patch  1 patch Transdermal Q24H NIvor Costa MD   1 patch at 08/26/22 1738   metolazone (ZAROXOLYN) tablet 2.5 mg  2.5 mg Oral Daily NIvor Costa MD   2.5 mg at 08/27/22 0938   metoprolol tartrate (LOPRESSOR) tablet 12.5 mg  12.5 mg Oral BID NIvor Costa MD   12.5 mg at 08/27/22 0912   morphine (MS CONTIN) 12 hr tablet 15 mg  15 mg Oral Q12H NIvor Costa MD   15 mg at 08/27/22 07494  morphine (PF) 2 MG/ML injection 2 mg  2 mg Intravenous Q4H PRN NIvor Costa MD   2 mg at 08/27/22 04967  multivitamin with minerals tablet 1 tablet  1 tablet Oral Daily NIvor Costa MD   1 tablet at 08/27/22 0912   ondansetron (ZOFRAN) injection 4 mg  4 mg Intravenous Q8H PRN NIvor Costa MD       pantoprazole (PROTONIX) EC tablet 40 mg  40 mg Oral Daily NIvor Costa MD   40 mg at 08/27/22 0911   polyethylene glycol (MIRALAX / GLYCOLAX) packet 17 g  17 g Oral Daily NIvor Costa MD   17 g at 08/27/22 0911   pregabalin (LYRICA) capsule 200 mg  200 mg Oral TID NIvor Costa MD   200 mg at 08/27/22 0912   senna-docusate (Senokot-S) tablet 1 tablet  1 tablet Oral QHS NIvor Costa MD   1 tablet at 08/26/22 2250   vancomycin (VANCOCIN) IVPB 1000 mg/200 mL premix  1,000 mg Intravenous Q12H Beers, BShanon Brow RPH        REVIEW OF SYSTEMS:   '[X]'$  denotes positive finding, '[ ]'$  denotes negative finding Cardiac  Comments:  Chest pain or chest pressure:    Shortness of breath upon exertion:    Short of breath when lying flat:    Irregular heart rhythm:        Vascular    Pain in calf, thigh, or hip brought on by ambulation:    Pain in feet at night that wakes you up from your sleep:     Blood clot in your veins:    Leg swelling:         Pulmonary    Oxygen at home:  Productive cough:     Wheezing:         Neurologic    Sudden weakness in arms or legs:     Sudden numbness in arms or legs:     Sudden onset of difficulty speaking or slurred speech:    Temporary loss of vision in one eye:     Problems with dizziness:         Gastrointestinal    Blood in stool:      Vomited blood:         Genitourinary    Burning when urinating:     Blood in urine:        Psychiatric    Major depression:         Hematologic    Bleeding problems:    Problems with blood clotting too easily:        Skin    Rashes or ulcers:        Constitutional    Fever or chills:     PHYSICAL EXAM:   Vitals:   08/26/22 1756 08/26/22 2248 08/27/22 0346 08/27/22 0725  BP: 106/71  115/81  103/64  Pulse: 78 87  71  Resp: '18 16  16  '$ Temp: 97.7 F (36.5 C) 97.9 F (36.6 C)  98 F (36.7 C)  TempSrc:      SpO2: 94% 94%  94%  Weight:   101.7 kg   Height:        GENERAL: The patient is a well-nourished female, in no acute distress. The vital signs are documented above. PULMONARY: Nonlabored respirations ABDOMEN: Soft and non-tender  MUSCULOSKELETAL: Right above-knee amputation stump is tender.  There is no erythema. NEUROLOGIC: No focal weakness or paresthesias are detected. SKIN: There are no ulcers or rashes noted. PSYCHIATRIC: The patient has a normal affect.  STUDIES:   I have reviewed her CT scan with the following findings: 1. Postsurgical changes of above knee amputation of the right leg. 2. Complex mixed attenuation fluid collection surrounding the distal femoral resection margin has increased in size from prior, now measuring 7.0 x 6.0 x 6.7 cm, previously measured 4.5 x 2.7 x 2.3 cm. Appearance remains nonspecific and could represent an adventitial bursa, which could be sterile or infected. Abscess is also a concern, although there is relatively little surrounding inflammatory changes 3. Stippled appearance of the distal femoral diaphysis is unchanged in appearance compared to the prior CT and may be related to disuse demineralization. Erosive/destructive changes is felt to be less likely given the lack of interval change. 4. Large ventral abdominal wall hernia containing multiple non-compromised appearing loops of large and small bowel. ASSESSMENT and PLAN   Right knee pain: I discussed with the patient that she now has a 7 x 7 fluid collection around her distal right femur which is the likely source of her pain.  We discussed the possibility of taking her to the operating room for I&D of this area.  She has been off of her Eliquis and now on heparin.  We will plan to do so tomorrow.  She will be n.p.o. after midnight.   Leia Alf, MD,  FACS [facility] [phone] Pager [phone]

## 2022-08-27 NOTE — H&P (View-Only) (Signed)
[facility] [place]  Patient name: Carla [name] MRN: [id number] DOB: 1961/03/12 Sex: female   REQUESTING PROVIDER:    Hospitalists   REASON FOR CONSULT:    Right knee pain  HISTORY OF PRESENT ILLNESS:   Carla [name] is a 61 y.o. female, with an extensive vascular history, having undergone extensive efforts for revascularization in the past.  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  She was most recently discharged on October 20 after a protracted hospital stay.  She returned to the ER because of severe pain in her right above-knee amputation.  She had a CT scan that showed a enlarging fluid collection concerning for abscess.  Her white count has been normal.  There is no significant stranding around the area surrounding her distal right femur.  We have been asked to assist with management.  PAST MEDICAL HISTORY    Past Medical History:  Diagnosis Date   Abdominal aortic atherosclerosis ([place])    a. 05/2017 CTA abd/pelvis: significant atherosclerotic dzs of the infrarenal abd Ao w/ some mural thrombus. No aneurysm or dissection.   Acute focal ischemia of small intestine (HCC)    Acute right-sided low back pain with right-sided sciatica 06/13/2017   AKI (acute kidney injury) ([place]) 07/29/2017   Anemia    Anginal pain ([place])    a. 08/2012 Lexiscan MV: EF 54%, non ischemia/infarct.   Anxiety and depression    Baker's cyst of knee, right    a. 07/2016 U/S: 4.1 x 1.4 x 2.9 cystic structure in R poplitetal fossa.   Bell's palsy    CAD (coronary artery disease)    CHF (congestive heart failure) ([place])    a.) TTE 06/20/2017: EF 60-65%, no rwma, G1DD, no source of cardiac emboli.   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 05/2017 CTA Abd/pelvis: apparent thrombus or embolus in prox SMA (70-90%); b. 05/2017 catheter directed tPA, mechanical thrombectomy, and stenting of the SMA.   Essential hypertension with goal blood pressure less than 140/90 01/19/2016   Gastroesophageal reflux disease 01/19/2016   GERD (gastroesophageal reflux disease)    H/O colectomy    History of 2019 novel coronavirus disease (COVID-19) 11/14/2019   History of kidney stones    Hyperlipidemia    Hypertension    Morbid obesity with BMI of 40.0-44.9, adult ([place])    NSTEMI (non-ST elevated myocardial infarction) ([place]) 06/27/2021   a.) high sensitivity troponins trended: 893 --> 1587 --> 1748 --> 868 --> 735 ng/L   Occlusive mesenteric ischemia ([place]) 06/19/2017   Osteoarthritis    PAD (peripheral artery disease) (HCC)    PAF (paroxysmal atrial fibrillation) (HCC)    a.) CHA2DS2-VASc = 6 (sex, CHF, HTN, DVT x2, aortic plaque). b.) rate/rhythm maintained on oral metoprolol tartrate; chronically anticoagulated with standard dose apixaban.   Palpitations    Peripheral edema    Personal history of other malignant neoplasm of skin 06/01/2016   Pneumonia    Pre-diabetes    Primary osteoarthritis of both knees 06/13/2017   SBO (small bowel obstruction) ([place]) 08/06/2017   Skin cancer of nose    Superior mesenteric artery thrombosis (HCC)  Vitamin D deficiency      FAMILY HISTORY   Family History  Problem Relation Age of Onset   Hypertension Mother    Heart disease Mother    Heart attack Mother    Breast cancer Mother    Hypertension Father    Breast cancer Paternal Aunt     SOCIAL HISTORY:   Social History   Socioeconomic History   Marital status: Single    Spouse name: Not on file   Number of children: Not on file   Years of education: Not on file   Highest education level: Not on file  Occupational History   Occupation:  Unemployed  Tobacco Use   Smoking status: Former    Packs/day: 1.00    Types: Cigarettes    Quit date: 2023    Years since quitting: 0.8   Smokeless tobacco: Never  Vaping Use   Vaping Use: Never used  Substance and Sexual Activity   Alcohol use: No   Drug use: No   Sexual activity: Not on file  Other Topics Concern   Not on file  Social History Narrative   Lives in [place] with daughter   Social Determinants of Health   Financial Resource Strain: Not on file  Food Insecurity: No Food Insecurity (08/26/2022)   Hunger Vital Sign    Worried About Running Out of Food in the Last Year: Never true    Ran Out of Food in the Last Year: Never true  Transportation Needs: No Transportation Needs (08/26/2022)   PRAPARE - Hydrologist (Medical): No    Lack of Transportation (Non-Medical): No  Physical Activity: Not on file  Stress: Not on file  Social Connections: Not on file  Intimate Partner Violence: Not At Risk (08/26/2022)   Humiliation, Afraid, Rape, and Kick questionnaire    Fear of Current or Ex-Partner: No    Emotionally Abused: No    Physically Abused: No    Sexually Abused: No    ALLERGIES:    Allergies  Allergen Reactions   Gabapentin     Tremors    Bactrim [Sulfamethoxazole-Trimethoprim] Itching    CURRENT MEDICATIONS:    Current Facility-Administered Medications  Medication Dose Route Frequency Provider Last Rate Last Admin   0.9 %  sodium chloride infusion   Intravenous PRN Ivor Costa, MD   Stopped at 08/26/22 2254   acetaminophen (TYLENOL) tablet 650 mg  650 mg Oral Q6H PRN Ivor Costa, MD       albuterol (PROVENTIL) (2.5 MG/3ML) 0.083% nebulizer solution 2.5 mg  2.5 mg Inhalation Q4H PRN Ivor Costa, MD       alum & mag hydroxide-simeth (MAALOX/MYLANTA) 200-200-20 MG/5ML suspension 15-30 mL  15-30 mL Oral Q2H PRN Ivor Costa, MD       aspirin EC tablet 81 mg  81 mg Oral Daily Ivor Costa, MD   81 mg at 08/27/22 8119    atorvastatin (LIPITOR) tablet 80 mg  80 mg Oral QHS Ivor Costa, MD   80 mg at 08/26/22 2249   cefTRIAXone (ROCEPHIN) 2 g in sodium chloride 0.9 % 100 mL IVPB  2 g Intravenous Q24H Ivor Costa, MD   Stopped at 08/26/22 2040   citalopram (CELEXA) tablet 40 mg  40 mg Oral Daily Ivor Costa, MD   40 mg at 08/27/22 1478   dextromethorphan-guaiFENesin (MUCINEX DM) 30-600 MG per 12 hr tablet 1 tablet  1 tablet Oral BID PRN Ivor Costa, MD  diphenhydrAMINE (BENADRYL) capsule 25 mg  25 mg Oral Q6H PRN Emeterio Reeve, DO       docusate sodium (COLACE) capsule 100 mg  100 mg Oral Daily PRN Ivor Costa, MD   100 mg at 08/27/22 0912   ferrous sulfate tablet 325 mg  325 mg Oral Daily Ivor Costa, MD   325 mg at 08/27/22 0912   furosemide (LASIX) tablet 20 mg  20 mg Oral Daily Ivor Costa, MD   20 mg at 08/27/22 0912   heparin ADULT infusion 100 units/mL (25000 units/261m)  1,400 Units/hr Intravenous Continuous GDallie Piles RPH 14 mL/hr at 08/27/22 0310 1,400 Units/hr at 08/27/22 0310   hydrALAZINE (APRESOLINE) injection 5 mg  5 mg Intravenous Q2H PRN NIvor Costa MD       HYDROcodone-acetaminophen (NORCO/VICODIN) 5-325 MG per tablet 1-2 tablet  1-2 tablet Oral Q4H PRN NIvor Costa MD   2 tablet at 08/27/22 0956   Ipratropium-Albuterol (COMBIVENT) respimat 1 puff  1 puff Inhalation Q6H NIvor Costa MD   1 puff at 08/27/22 0740   isosorbide mononitrate (IMDUR) 24 hr tablet 60 mg  60 mg Oral Daily NIvor Costa MD   60 mg at 08/27/22 0911   lidocaine (LIDODERM) 5 % 1 patch  1 patch Transdermal Q24H NIvor Costa MD   1 patch at 08/26/22 1738   metolazone (ZAROXOLYN) tablet 2.5 mg  2.5 mg Oral Daily NIvor Costa MD   2.5 mg at 08/27/22 0938   metoprolol tartrate (LOPRESSOR) tablet 12.5 mg  12.5 mg Oral BID NIvor Costa MD   12.5 mg at 08/27/22 0912   morphine (MS CONTIN) 12 hr tablet 15 mg  15 mg Oral Q12H NIvor Costa MD   15 mg at 08/27/22 03329  morphine (PF) 2 MG/ML injection 2 mg  2 mg Intravenous Q4H PRN NIvor Costa MD   2 mg at 08/27/22 05188  multivitamin with minerals tablet 1 tablet  1 tablet Oral Daily NIvor Costa MD   1 tablet at 08/27/22 0912   ondansetron (ZOFRAN) injection 4 mg  4 mg Intravenous Q8H PRN NIvor Costa MD       pantoprazole (PROTONIX) EC tablet 40 mg  40 mg Oral Daily NIvor Costa MD   40 mg at 08/27/22 0911   polyethylene glycol (MIRALAX / GLYCOLAX) packet 17 g  17 g Oral Daily NIvor Costa MD   17 g at 08/27/22 0911   pregabalin (LYRICA) capsule 200 mg  200 mg Oral TID NIvor Costa MD   200 mg at 08/27/22 0912   senna-docusate (Senokot-S) tablet 1 tablet  1 tablet Oral QHS NIvor Costa MD   1 tablet at 08/26/22 2250   vancomycin (VANCOCIN) IVPB 1000 mg/200 mL premix  1,000 mg Intravenous Q12H Beers, BShanon Brow RPH        REVIEW OF SYSTEMS:   '[X]'$  denotes positive finding, '[ ]'$  denotes negative finding Cardiac  Comments:  Chest pain or chest pressure:    Shortness of breath upon exertion:    Short of breath when lying flat:    Irregular heart rhythm:        Vascular    Pain in calf, thigh, or hip brought on by ambulation:    Pain in feet at night that wakes you up from your sleep:     Blood clot in your veins:    Leg swelling:         Pulmonary    Oxygen at home:  Productive cough:     Wheezing:         Neurologic    Sudden weakness in arms or legs:     Sudden numbness in arms or legs:     Sudden onset of difficulty speaking or slurred speech:    Temporary loss of vision in one eye:     Problems with dizziness:         Gastrointestinal    Blood in stool:      Vomited blood:         Genitourinary    Burning when urinating:     Blood in urine:        Psychiatric    Major depression:         Hematologic    Bleeding problems:    Problems with blood clotting too easily:        Skin    Rashes or ulcers:        Constitutional    Fever or chills:     PHYSICAL EXAM:   Vitals:   08/26/22 1756 08/26/22 2248 08/27/22 0346 08/27/22 0725  BP: 106/71  115/81  103/64  Pulse: 78 87  71  Resp: '18 16  16  '$ Temp: 97.7 F (36.5 C) 97.9 F (36.6 C)  98 F (36.7 C)  TempSrc:      SpO2: 94% 94%  94%  Weight:   101.7 kg   Height:        GENERAL: The patient is a well-nourished female, in no acute distress. The vital signs are documented above. PULMONARY: Nonlabored respirations ABDOMEN: Soft and non-tender  MUSCULOSKELETAL: Right above-knee amputation stump is tender.  There is no erythema. NEUROLOGIC: No focal weakness or paresthesias are detected. SKIN: There are no ulcers or rashes noted. PSYCHIATRIC: The patient has a normal affect.  STUDIES:   I have reviewed her CT scan with the following findings: 1. Postsurgical changes of above knee amputation of the right leg. 2. Complex mixed attenuation fluid collection surrounding the distal femoral resection margin has increased in size from prior, now measuring 7.0 x 6.0 x 6.7 cm, previously measured 4.5 x 2.7 x 2.3 cm. Appearance remains nonspecific and could represent an adventitial bursa, which could be sterile or infected. Abscess is also a concern, although there is relatively little surrounding inflammatory changes 3. Stippled appearance of the distal femoral diaphysis is unchanged in appearance compared to the prior CT and may be related to disuse demineralization. Erosive/destructive changes is felt to be less likely given the lack of interval change. 4. Large ventral abdominal wall hernia containing multiple non-compromised appearing loops of large and small bowel. ASSESSMENT and PLAN   Right knee pain: I discussed with the patient that she now has a 7 x 7 fluid collection around her distal right femur which is the likely source of her pain.  We discussed the possibility of taking her to the operating room for I&D of this area.  She has been off of her Eliquis and now on heparin.  We will plan to do so tomorrow.  She will be n.p.o. after midnight.   Leia Alf, MD,  FACS [facility] [phone] Pager [phone]

## 2022-08-27 NOTE — Plan of Care (Signed)

## 2022-08-27 NOTE — Plan of Care (Signed)
  Problem: Clinical Measurements: Goal: Cardiovascular complication will be avoided Outcome: Progressing   Problem: Activity: Goal: Risk for activity intolerance will decrease Outcome: Progressing   Problem: Nutrition: Goal: Adequate nutrition will be maintained Outcome: Progressing   Problem: Coping: Goal: Level of anxiety will decrease Outcome: Progressing   Problem: Elimination: Goal: Will not experience complications related to urinary retention Outcome: Progressing   Problem: Pain Managment: Goal: General experience of comfort will improve Outcome: Progressing   Problem: Skin Integrity: Goal: Risk for impaired skin integrity will decrease Outcome: Progressing

## 2022-08-27 NOTE — Progress Notes (Signed)
PROGRESS NOTE    Carla [name]   FHQ:[id number] DOB: Nov 10, 1960  DOA: 08/26/2022 Date of Service: 08/27/22 PCP: [facility] [place]     Brief Narrative / Hospital Course:  Carla [name] is a 61 y.o. female with medical history significant of PAD status post right AKA and left axillary femoral bypass graft, diastolic CHF, COPD, and superior mesenteric artery thrombus and atrial fibrillation on Eliquis, hypertension, hyperlipidemia, depression, CAD, obesity, chronic pain, anemia, right knee abscess at AKA site, who present with increasing pain in right AKA stump. Pt had abscess of right knee at AKA site recently. She completed a course of Abx. Now comes back with worsening pain in that site in the past several days. Patient also has erythema at right AKA stump site 11/04: in ED - WBC 7.2, potassium 3.1, GFR> 60, temperature normal, blood pressure 113/67, heart rate 83, RR 24, oxygen saturation 93% on room air.  Negative COVID PCR.  X-ray of left femur is negative for bony fracture. CT R femur (+) "Complex mixed attenuation fluid collection surrounding the distal femoral resection margin has increased in size from prior... nonspecific and could represent an adventitial bursa, which could be sterile or infected. Abscess is also a concern, although there is relatively little surrounding inflammatory changes." Patient is admitted to telemetry bed as inpatient. VVS, Dr. Trula Slade is consulted.  11/05: no concerns on labs/VS. Vascular surgery planning I&D tomorrow 11/06  Consultants:  Vein/Vascular Surgery   Procedures: planning I&D tomorrow 11/06      ASSESSMENT & PLAN:   Principal Problem:   Abscess of knee, right Active Problems:   Chronic mesenteric ischemia (HCC)   PAD (peripheral artery disease) (HCC)   CAD (coronary artery disease)   Chronic diastolic CHF (congestive heart failure) (HCC)   PAF (paroxysmal atrial fibrillation) (HCC)   COPD (chronic obstructive pulmonary  disease) (HCC)   HLD (hyperlipidemia)   Recurrent major depressive disorder (HCC)   Chronic pain   Hypokalemia   Iron deficiency anemia   Morbid obesity with BMI of 40.0-44.9, adult ([place])  S/p R BKA Pain in stump, fluid collection on CT concern for possible abscess of knee, right Does not meet criteria for sepsis.   Admitting physician consulted Dr. Pascal Lux of IR for possible drainage, but he recommended surgical debridement.   Admitting physician consulted Dr. Trula Slade of vascular surgery --> planning I&D tomorrow 11/06 Admitted to telemetry bed as inpatient Vancomycin and Rocephin (patient received 1 dose of Zosyn in ED) blood culture pain control: Continue home MS Contin and Norco, start as needed morphine   Chronic mesenteric ischemia switched Eliquis to IV heparin   PAD (peripheral artery disease) [facility]):  s/p of left axillary femoral bypass graft, Aspirin, Lipitor   CAD (coronary artery disease):  no CP Aspirin and Lipitor, imdur   Chronic diastolic CHF (congestive heart failure) ([place]):  2D echo on 06/15/2018 showed EF of 60 to 65%.   No signs of CHF exacerbation. Continue home Lasix 20 mg daily and metolazone 2.5 mg daily   PAF (paroxysmal atrial fibrillation) (HCC) Switch Eliquis to IV heparin as above Metoprolol   COPD (chronic obstructive pulmonary disease) (HCC): Stable Bronchodilators   HLD (hyperlipidemia) Lipitor   Recurrent major depressive disorder ([place]) Continue home medications   Chronic pain: Continue home MS Contin and Norco   Hypokalemia - resolved Potassium 3.1 on admission Repleted potassium Check a magnesium level --> WNL Follow BMP   Iron deficiency anemia:  Hemoglobin stable.  10.7 (8.1  on 08/09/2022) Continue home iron supplement   Morbid obesity with BMI of 40.0-44.9, adult [facility]):   BMI= 41.37  and BW= 102.6 Kg Diet and exercise.   Encouraged to lose weight.    DVT prophylaxis: heparin (pending possible surgical intervention)   Pertinent IV fluids/nutrition: no conitnuous IV fluids Central lines / invasive devices: none  Code Status: FULL CODE Family Communication: none at this time   Disposition: anticipate d/c back to previous SNF TOC needs: confirm SNF on eventual discharge  Barriers to discharge / significant pending items: pending VVS possible intervention, on IV abx pending BCx, anticipate d/c in 1-2 days              Subjective:  Patient reports pain in RLE stump is bad, morphine doesn't last very long, she requests dilaudid. She is resting comfortably at this time. She states morhpine causes itching.        Objective:  Vitals:   08/26/22 1756 08/26/22 2248 08/27/22 0346 08/27/22 0725  BP: 106/71 115/81  103/64  Pulse: 78 87  71  Resp: '18 16  16  '$ Temp: 97.7 F (36.5 C) 97.9 F (36.6 C)  98 F (36.7 C)  TempSrc:      SpO2: 94% 94%  94%  Weight:   101.7 kg   Height:        Intake/Output Summary (Last 24 hours) at 08/27/2022 1404 Last data filed at 08/27/2022 2683 Gross per 24 hour  Intake 365.78 ml  Output 2225 ml  Net -1859.22 ml   Filed Weights   08/26/22 0807 08/26/22 1447 08/27/22 0346  Weight: 103.9 kg 102.6 kg 101.7 kg    Examination:  Constitutional:  VS as above General Appearance: alert, obese, NAD Respiratory: Normal respiratory effort No wheeze No rhonchi No rales Cardiovascular: S1/S2 normal No murmur No rub/gallop auscultated No lower extremity edema Gastrointestinal: No tenderness No masses No hernia appreciated Musculoskeletal:  No clubbing/cyanosis of digits Symmetrical movement in all extremities R BKA - bandage over stump, no proximal erythema or tenderness  Neurological: No cranial nerve deficit on limited exam Alert Psychiatric: Normal judgment/insight Normal mood and affect       Scheduled Medications:   aspirin EC  81 mg Oral Daily   atorvastatin  80 mg Oral QHS   citalopram  40 mg Oral Daily   ferrous sulfate  325 mg  Oral Daily   furosemide  20 mg Oral Daily   Ipratropium-Albuterol  1 puff Inhalation Q6H   isosorbide mononitrate  60 mg Oral Daily   lidocaine  1 patch Transdermal Q24H   metolazone  2.5 mg Oral Daily   metoprolol tartrate  12.5 mg Oral BID   morphine  15 mg Oral Q12H   multivitamin with minerals  1 tablet Oral Daily   pantoprazole  40 mg Oral Daily   polyethylene glycol  17 g Oral Daily   pregabalin  200 mg Oral TID   senna-docusate  1 tablet Oral QHS    Continuous Infusions:  sodium chloride Stopped (08/26/22 2254)   cefTRIAXone (ROCEPHIN)  IV Stopped (08/26/22 2040)   heparin 1,400 Units/hr (08/27/22 0310)   vancomycin      PRN Medications:  sodium chloride, acetaminophen, albuterol, alum & mag hydroxide-simeth, dextromethorphan-guaiFENesin, diphenhydrAMINE, docusate sodium, hydrALAZINE, HYDROcodone-acetaminophen, morphine injection, ondansetron (ZOFRAN) IV  Antimicrobials:  Anti-infectives (From admission, onward)    Start     Dose/Rate Route Frequency Ordered Stop   08/27/22 1215  vancomycin (VANCOCIN) IVPB 1000 mg/200 mL premix  1,000 mg 200 mL/hr over 60 Minutes Intravenous Every 12 hours 08/27/22 1126     08/26/22 1930  vancomycin (VANCOCIN) IVPB 1000 mg/200 mL premix        1,000 mg 200 mL/hr over 60 Minutes Intravenous  Once 08/26/22 1836 08/26/22 2216   08/26/22 1900  cefTRIAXone (ROCEPHIN) 2 g in sodium chloride 0.9 % 100 mL IVPB        2 g 200 mL/hr over 30 Minutes Intravenous Every 24 hours 08/26/22 1830     08/26/22 1045  vancomycin (VANCOCIN) IVPB 1000 mg/200 mL premix        1,000 mg 200 mL/hr over 60 Minutes Intravenous  Once 08/26/22 1035 08/26/22 1211   08/26/22 1045  piperacillin-tazobactam (ZOSYN) IVPB 3.375 g        3.375 g 100 mL/hr over 30 Minutes Intravenous  Once 08/26/22 1035 08/26/22 1211       Data Reviewed: I have personally reviewed following labs and imaging studies  CBC: Recent Labs  Lab 08/26/22 0810 08/27/22 0533  WBC  7.2 5.9  NEUTROABS 4.0  --   HGB 10.7* 10.4*  HCT 33.6* 33.0*  MCV 91.3 91.9  PLT 232 509   Basic Metabolic Panel: Recent Labs  Lab 08/26/22 0810 08/26/22 1303 08/27/22 0533  NA 136  --  139  K 3.1*  --  3.7  CL 95*  --  100  CO2 31  --  26  GLUCOSE 117*  --  127*  BUN 19  --  18  CREATININE 0.83  --  0.73  CALCIUM 9.1  --  9.2  MG  --  2.0  --    GFR: Estimated Creatinine Clearance: 81.4 mL/min (by C-G formula based on SCr of 0.73 mg/dL). Liver Function Tests: No results for input(s): "AST", "ALT", "ALKPHOS", "BILITOT", "PROT", "ALBUMIN" in the last 168 hours. No results for input(s): "LIPASE", "AMYLASE" in the last 168 hours. No results for input(s): "AMMONIA" in the last 168 hours. Coagulation Profile: Recent Labs  Lab 08/26/22 1316  INR 1.3*   Cardiac Enzymes: No results for input(s): "CKTOTAL", "CKMB", "CKMBINDEX", "TROPONINI" in the last 168 hours. BNP (last 3 results) No results for input(s): "PROBNP" in the last 8760 hours. HbA1C: No results for input(s): "HGBA1C" in the last 72 hours. CBG: Recent Labs  Lab 08/27/22 0726  GLUCAP 105*   Lipid Profile: No results for input(s): "CHOL", "HDL", "LDLCALC", "TRIG", "CHOLHDL", "LDLDIRECT" in the last 72 hours. Thyroid Function Tests: No results for input(s): "TSH", "T4TOTAL", "FREET4", "T3FREE", "THYROIDAB" in the last 72 hours. Anemia Panel: No results for input(s): "VITAMINB12", "FOLATE", "FERRITIN", "TIBC", "IRON", "RETICCTPCT" in the last 72 hours. Urine analysis:    Component Value Date/Time   COLORURINE AMBER (A) 03/22/2022 1520   APPEARANCEUR CLOUDY (A) 03/22/2022 1520   APPEARANCEUR Clear 01/19/2015 1112   LABSPEC 1.016 03/22/2022 1520   LABSPEC 1.019 01/19/2015 1112   PHURINE 6.0 03/22/2022 1520   GLUCOSEU NEGATIVE 03/22/2022 1520   GLUCOSEU Negative 01/19/2015 1112   HGBUR LARGE (A) 03/22/2022 1520   BILIRUBINUR NEGATIVE 03/22/2022 1520   BILIRUBINUR Negative 01/19/2015 1112   KETONESUR  NEGATIVE 03/22/2022 1520   PROTEINUR 30 (A) 03/22/2022 1520   NITRITE POSITIVE (A) 03/22/2022 1520   LEUKOCYTESUR LARGE (A) 03/22/2022 1520   LEUKOCYTESUR Negative 01/19/2015 1112   Sepsis Labs: '@LABRCNTIP'$ (procalcitonin:4,lacticidven:4)  Recent Results (from the past 240 hour(s))  SARS Coronavirus 2 by RT PCR (hospital order, performed in [facility] hospital lab) *cepheid single result test*  Anterior Nasal Swab     Status: None   Collection Time: 08/26/22  1:03 PM   Specimen: Anterior Nasal Swab  Result Value Ref Range Status   SARS Coronavirus 2 by RT PCR NEGATIVE NEGATIVE Final    Comment: (NOTE) SARS-CoV-2 target nucleic acids are NOT DETECTED.  The SARS-CoV-2 RNA is generally detectable in upper and lower respiratory specimens during the acute phase of infection. The lowest concentration of SARS-CoV-2 viral copies this assay can detect is 250 copies / mL. A negative result does not preclude SARS-CoV-2 infection and should not be used as the sole basis for treatment or other patient management decisions.  A negative result may occur with improper specimen collection / handling, submission of specimen other than nasopharyngeal swab, presence of viral mutation(s) within the areas targeted by this assay, and inadequate number of viral copies (<250 copies / mL). A negative result must be combined with clinical observations, patient history, and epidemiological information.  Fact Sheet for Patients:   https://www.patel.info/  Fact Sheet for Healthcare Providers: https://hall.com/  This test is not yet approved or  cleared by the [place] FDA and has been authorized for detection and/or diagnosis of SARS-CoV-2 by FDA under an Emergency Use Authorization (EUA).  This EUA will remain in effect (meaning this test can be used) for the duration of the COVID-19 declaration under Section 564(b)(1) of the Act, 21 U.S.C. section  360bbb-3(b)(1), unless the authorization is terminated or revoked sooner.  Performed at [facility], [address], [place], [place] [address]   Surgical pcr screen     Status: None   Collection Time: 08/26/22  5:33 PM   Specimen: Nasal Mucosa; Nasal Swab  Result Value Ref Range Status   MRSA, PCR NEGATIVE NEGATIVE Final   Staphylococcus aureus NEGATIVE NEGATIVE Final    Comment: (NOTE) The Xpert SA Assay (FDA approved for NASAL specimens in patients 97 years of age and older), is one component of a comprehensive surveillance program. It is not intended to diagnose infection nor to guide or monitor treatment. Performed at [facility], [address] [place], [place] [address]          Radiology Studies: CT FEMUR RIGHT W CONTRAST  Result Date: 08/26/2022 CLINICAL DATA:  Right leg pain.  History of above knee amputation EXAM: CT OF THE LOWER RIGHT EXTREMITY WITH CONTRAST TECHNIQUE: Multidetector CT imaging of the lower right extremity was performed according to the standard protocol following intravenous contrast administration. RADIATION DOSE REDUCTION: This exam was performed according to the departmental dose-optimization program which includes automated exposure control, adjustment of the mA and/or kV according to patient size and/or use of iterative reconstruction technique. CONTRAST:  139m OMNIPAQUE IOHEXOL 300 MG/ML  SOLN COMPARISON:  X-ray 08/26/2022, CT 07/30/2022 FINDINGS: Bones/Joint/Cartilage Postsurgical changes of above knee amputation of the right leg. Stippled appearance of the anterior greater than posterior cortex of the distal femoral diaphysis is unchanged in appearance compared to the prior CT and may be related to demineralization (series 4, image 309). Erosive/destructive changes is felt to be less likely given the lack of interval change. Amputation margin remains sharp. Proximal femur is intact without fracture or dislocation. The  visualized pelvis is intact without fracture or diastasis. No lytic or sclerotic bone lesion. Ligaments Suboptimally assessed by CT. Muscles and Tendons Amputation changes of the distal thigh musculotendinous structures with associated muscle atrophy. Soft tissues Complex mixed attenuation fluid collection surrounding the distal femoral resection margin has increased in size  from prior, now measuring 7.0 x 6.0 x 6.7 cm (series 3, image 323), previously measured 4.5 x 2.7 x 2.3 cm. Collection demonstrates thick peripheral enhancement. No internal soft tissue gas. Relative lack of stranding/inflammatory changes within the adjacent subcutaneous fat. No right inguinal lymphadenopathy. Large ventral abdominal wall hernia containing non compromised appearing loops of large and small bowel. Partially imaged aortoiliac stent graft. IMPRESSION: 1. Postsurgical changes of above knee amputation of the right leg. 2. Complex mixed attenuation fluid collection surrounding the distal femoral resection margin has increased in size from prior, now measuring 7.0 x 6.0 x 6.7 cm, previously measured 4.5 x 2.7 x 2.3 cm. Appearance remains nonspecific and could represent an adventitial bursa, which could be sterile or infected. Abscess is also a concern, although there is relatively little surrounding inflammatory changes 3. Stippled appearance of the distal femoral diaphysis is unchanged in appearance compared to the prior CT and may be related to disuse demineralization. Erosive/destructive changes is felt to be less likely given the lack of interval change. 4. Large ventral abdominal wall hernia containing multiple non-compromised appearing loops of large and small bowel. Electronically Signed   By: Davina Poke D.O.   On: 08/26/2022 10:08   DG Femur Min 2 Views Right  Result Date: 08/26/2022 CLINICAL DATA:  RIGHT leg/stump pain.  History of AKA in June 2023. EXAM: RIGHT FEMUR 2 VIEWS COMPARISON:  07/30/2022 and prior studies  FINDINGS: AKA changes again noted. There is no evidence of acute fracture, subluxation, dislocation or definite radiographic evidence of osteomyelitis. No definite gas in the soft tissues noted. No unexpected radiopaque foreign bodies noted. IMPRESSION: AKA changes without acute abnormality. If there is strong clinical suspicion for acute osteomyelitis, recommend MRI. Electronically Signed   By: Margarette Canada M.D.   On: 08/26/2022 08:47            LOS: 1 day      Emeterio Reeve, DO [facility] 08/27/2022, 2:04 PM   Staff may message me via secure chat in [place]  but this may not receive immediate response,  please page for urgent matters!  If 7PM-7AM, please contact night-coverage [email or web address]  Dictation software was used to generate the above note. Typos may occur and escape review, as with typed/written notes. Please contact Dr Sheppard Coil directly for clarity if needed.

## 2022-08-27 NOTE — TOC Initial Note (Signed)
Transition of Care [facility]) - Initial/Assessment Note    Patient Details  Name: Carla [name] MRN: [id number] Date of Birth: May 09, 1961  Transition of Care [facility]) CM/SW Contact:    Magnus Ivan, LCSW Phone Number: 08/27/2022, 11:05 AM  Clinical Narrative:                 CSW spoke with patient for high risk assessment. Patient is from home with her daughter Carla [name].  PCP is [employer] [facility]. Pharmacy is [facility].  Patient was at [facility] for short term rehab prior to admission and feels she needs to return for more rehab before returning home. Asked MD for PT/OT evals.  Patient states her home address is [place], [place]. Patient states she has a friend who works for [employer]" and she is interested in getting these services when DC, explained that Medicare usually does not cover aide services and patient states she cannot private pay. Encouraged patient to reach out to DSS to see if her Medicaid can cover this. Also informed her that HHPT/OT/RN/Aide can likely be arranged when she DC's from [facility].  TOC will continue to follow.    Expected Discharge Plan: [place] Barriers to Discharge: Continued Medical Work up   Patient Goals and CMS Choice Patient states their goals for this hospitalization and ongoing recovery are:: return to [name] Compare Post Acute Care list provided to:: Patient Choice offered to / list presented to : Patient  Expected Discharge Plan and Services Expected Discharge Plan: [place]       Living arrangements for the past 2 months: Single Family Home                                      Prior Living Arrangements/Services Living arrangements for the past 2 months: Single Family Home Lives with:: Adult Children Patient language and need for interpreter reviewed:: Yes Do you feel safe going back to the place where you live?: Yes      Need for  Family Participation in Patient Care: Yes (Comment) Care giver support system in place?: Yes (comment) Current home services: DME Criminal Activity/Legal Involvement Pertinent to Current Situation/Hospitalization: No - Comment as needed  Activities of Daily Living Home Assistive Devices/Equipment: Wheelchair ADL Screening (condition at time of admission) Patient's cognitive ability adequate to safely complete daily activities?: Yes Is the patient deaf or have difficulty hearing?: No Does the patient have difficulty seeing, even when wearing glasses/contacts?: No Does the patient have difficulty concentrating, remembering, or making decisions?: No Patient able to express need for assistance with ADLs?: Yes Does the patient have difficulty dressing or bathing?: No Independently performs ADLs?: Yes (appropriate for developmental age) Does the patient have difficulty walking or climbing stairs?: No Weakness of Legs: None Weakness of Arms/Hands: None  Permission Sought/Granted Permission sought to share information with : Customer service manager, Family Supports Permission granted to share information with : Yes, Hospital doctor              Emotional Assessment       Orientation: : Oriented to Self, Oriented to Place, Oriented to  Time, Oriented to Situation Alcohol / Substance Use: Not Applicable Psych Involvement: No (comment)  Admission diagnosis:  Right leg pain [M79.604] Abscess of knee, right [L02.415] Leg abscess [L02.419] Status post above-knee amputation of right lower  extremity ([place]) [Z89.611] Patient Active Problem List   Diagnosis Date Noted   Right leg pain 08/26/2022   HLD (hyperlipidemia) 08/26/2022   Iron deficiency anemia 08/26/2022   Acute blood loss anemia 08/09/2022   Hematoma 07/26/2022   Abscess of knee, right 07/24/2022   Acute respiratory failure with hypoxia (HCC) 05/02/2022   Lactic acidosis 05/02/2022   Hypoalbuminemia due to  protein-calorie malnutrition ([place]) 05/02/2022   Atherosclerosis of artery of extremity with rest pain ([place]) 04/06/2022   Rectal bleeding 03/21/2022   Hypokalemia 02/03/2022   Chronic anticoagulation    Anxiety and depression    Palpitations    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[place]) 12/03/2021   Critical limb ischemia of left lower extremity ([place]) 11/30/2021   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[place]) 11/11/2021   Arterial occlusion 11/11/2021   CAD (coronary artery disease) 07/07/2021   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[place]) 06/27/2021   Ischemic pain of right foot 06/27/2021   PAD (peripheral artery disease) ([place]) 05/03/2021   Tobacco abuse counseling 10/26/2020   Smoker 10/26/2020   Chronic diastolic CHF (congestive heart failure) ([place]) 10/06/2020   Chronic pain 12/01/2019   Primary osteoarthritis involving multiple joints 12/01/2019   Carpal tunnel syndrome, right 11/10/2019   Health care maintenance 03/26/2019   Disorder of skeletal system 03/26/2019   Problems influencing health status 03/26/2019   Chronic low back pain (Secondary area of Pain) (Bilateral) w/o sciatica 03/26/2019   Aortic atherosclerosis ([place]) 09/12/2018   COPD (chronic obstructive pulmonary disease) ([place]) 09/12/2018   Moderate episode of recurrent major depressive disorder ([place]) 09/12/2018   Lumbar spondylosis 09/06/2018   Ventral hernia with obstruction and without gangrene 07/02/2018   PAF (paroxysmal atrial fibrillation) ([place]) 06/15/2018   Acute encephalopathy 04/08/2018   DDD (degenerative disc disease), lumbosacral 09/25/2017   Osteoarthritis of knee 09/25/2017   Adjustment disorder with anxiety 08/15/2017   Protein-calorie malnutrition, severe 08/08/2017   Chronic mesenteric ischemia ([place])    AKI (acute kidney injury) ([place]) 07/29/2017   Hypotension 07/29/2017   Embolus of superior mesenteric artery ([place])    Abdominal pain    Occlusive mesenteric ischemia ([place]) 06/19/2017   Acute focal ischemia of small intestine  (HCC)    Acute right-sided low back pain with right-sided sciatica 06/13/2017   Osteoarthritis of knee (Bilateral) 06/13/2017   Chronic pain syndrome 09/13/2016   Morbid obesity with BMI of 40.0-44.9, adult ([place]) 07/16/2016   Personal history of other malignant neoplasm of skin 06/01/2016   Chronic knee pain (Primary Area of Pain) (Bilateral) 01/19/2016   Essential hypertension 01/19/2016   GERD (gastroesophageal reflux disease) 01/19/2016   Mixed hyperlipidemia 01/19/2016   Prediabetes 01/19/2016   Recurrent major depressive disorder ([place]) 01/19/2016   Chronic pelvic pain in female 11/14/2013   Mixed stress and urge urinary incontinence 11/14/2013   Chest pain 08/06/2012   PCP:  [facility] [place]:   [facility] [address]), [place] [address] [place] ([place]) [place] [address] Phone: [phone] Fax: [phone]     Social Determinants of Health (SDOH) Interventions Housing Interventions: Intervention Not Indicated  Readmission Risk Interventions    07/25/2022   10:49 AM 05/05/2022   10:52 AM 03/24/2022    9:51 AM  Readmission Risk Prevention Plan  Transportation Screening Complete Complete Complete  PCP or Specialist Appt within 3-5 Days   Complete  HRI or [place]   Complete  Social Work Consult for [place] Planning/Counseling   Complete  Palliative Care Screening  Not Applicable  Medication Review (RN Care Manager) Complete Complete Complete  PCP or Specialist appointment within 3-5 days of discharge Complete Complete   HRI or Home Care Consult Complete    SW Recovery Care/Counseling Consult Not Complete Complete   SW Consult Not Complete Comments NA    Palliative Care Screening  Not [place] Patient Refused Patient Refused

## 2022-08-27 NOTE — Consult Note (Addendum)
Pharmacy Antibiotic Note  Carla [name] is a 61 y.o. female  w/ h/o peripheral arterial disease s/p right AKA, chronic diastolic dysfunction CHF, anxiety and depression, COPD, paroxysmal A-fib on chronic anticoagulation therapy, who presents to the ED with increasing pain in her right AKA stump with abscess. Pharmacy has been consulted for vancomycin dosing.  Plan: F/u MRSA PCR (staph negative / MRSA negative) & cultures pending. Scr stable, at baseline. Can discuss narrowing coverage with pt improvement / cultures v.s. need for levels monitoring on 11/07 with q12h regimen  Vancomycin 2g IV (1g + 1g)  x1 for loading dose completed 11/4 2116; followed by: Vancomycin 1g IV q12h (11/5 1130>> Goal AUC 400-600  Est AUC: 558; Cmax: 34.3; Cmin: 15.9 SCr 0.83; AdjBW (BMI 41); Vd 0.5 Pt also received Zosyn 3.375g IV x1; followed by ceftriaxone 2g IV q24h   Height: '5\' 2"'$  (157.5 cm) Weight: 101.7 kg (224 lb 3.3 oz) IBW/kg (Calculated) : 50.1  Temp (24hrs), Avg:97.9 F (36.6 C), Min:97.7 F (36.5 C), Max:98.2 F (36.8 C)  Recent Labs  Lab 08/26/22 0810 08/27/22 0533  WBC 7.2 5.9  CREATININE 0.83 0.73     Estimated Creatinine Clearance: 81.4 mL/min (by C-G formula based on SCr of 0.73 mg/dL).    Allergies  Allergen Reactions   Gabapentin     Tremors    Bactrim [Sulfamethoxazole-Trimethoprim] Itching    Antimicrobials this admission: VAN/ZOS x1 (11/4) VAN/CRO (11/4 >>   Dose adjustments this admission: CTM and adjust PRN  Microbiology results: 11/4 BCx: sent 11/4 MRSA PCR: Negative 11/4 Covid PCR: negative  Thank you for allowing pharmacy to be a part of this patient's care.  Shanon Brow Laquasia Pincus 08/27/2022 11:28 AM

## 2022-08-28 ENCOUNTER — Inpatient Hospital Stay: Payer: Medicare Other | Admitting: Anesthesiology

## 2022-08-28 ENCOUNTER — Encounter
Admission: EM | Disposition: A | Discharge status: Skilled Nursing Facility | Payer: Self-pay | Source: Skilled Nursing Facility | Attending: Osteopathic Medicine

## 2022-08-28 ENCOUNTER — Encounter: Payer: Self-pay | Admitting: Internal Medicine

## 2022-08-28 ENCOUNTER — Other Ambulatory Visit: Payer: Self-pay

## 2022-08-28 DIAGNOSIS — E782 Mixed hyperlipidemia: Secondary | ICD-10-CM

## 2022-08-28 DIAGNOSIS — I480 Paroxysmal atrial fibrillation: Secondary | ICD-10-CM

## 2022-08-28 DIAGNOSIS — L8990 Pressure ulcer of unspecified site, unspecified stage: Secondary | ICD-10-CM | POA: Insufficient documentation

## 2022-08-28 DIAGNOSIS — F3340 Major depressive disorder, recurrent, in remission, unspecified: Secondary | ICD-10-CM

## 2022-08-28 DIAGNOSIS — M7981 Nontraumatic hematoma of soft tissue: Secondary | ICD-10-CM

## 2022-08-28 DIAGNOSIS — T8789 Other complications of amputation stump: Secondary | ICD-10-CM

## 2022-08-28 DIAGNOSIS — G894 Chronic pain syndrome: Secondary | ICD-10-CM

## 2022-08-28 DIAGNOSIS — E876 Hypokalemia: Secondary | ICD-10-CM

## 2022-08-28 DIAGNOSIS — I2510 Atherosclerotic heart disease of native coronary artery without angina pectoris: Secondary | ICD-10-CM

## 2022-08-28 HISTORY — PX: APPLICATION OF WOUND VAC: SHX5189

## 2022-08-28 HISTORY — PX: IRRIGATION AND DEBRIDEMENT ABSCESS: SHX5252

## 2022-08-28 LAB — GLUCOSE, CAPILLARY
Glucose-Capillary: 125 mg/dL — ABNORMAL HIGH (ref 70–99)
Glucose-Capillary: 126 mg/dL — ABNORMAL HIGH (ref 70–99)

## 2022-08-28 LAB — CBC
HCT: 33.6 % — ABNORMAL LOW (ref 36.0–46.0)
Hemoglobin: 10.6 g/dL — ABNORMAL LOW (ref 12.0–15.0)
MCH: 28.9 pg (ref 26.0–34.0)
MCHC: 31.5 g/dL (ref 30.0–36.0)
MCV: 91.6 fL (ref 80.0–100.0)
Platelets: 230 10*3/uL (ref 150–400)
RBC: 3.67 MIL/uL — ABNORMAL LOW (ref 3.87–5.11)
RDW: 17.1 % — ABNORMAL HIGH (ref 11.5–15.5)
WBC: 7 10*3/uL (ref 4.0–10.5)
nRBC: 0 % (ref 0.0–0.2)

## 2022-08-28 LAB — BASIC METABOLIC PANEL
Anion gap: 10 (ref 5–15)
BUN: 21 mg/dL (ref 8–23)
CO2: 30 mmol/L (ref 22–32)
Calcium: 9.1 mg/dL (ref 8.9–10.3)
Chloride: 99 mmol/L (ref 98–111)
Creatinine, Ser: 0.7 mg/dL (ref 0.44–1.00)
GFR, Estimated: >60 mL/min (ref >60)
Glucose, Bld: 109 mg/dL — ABNORMAL HIGH (ref 70–99)
Potassium: 3.7 mmol/L (ref 3.5–5.1)
Sodium: 139 mmol/L (ref 135–145)

## 2022-08-28 LAB — HEPARIN LEVEL (UNFRACTIONATED): Heparin Unfractionated: 1.1 IU/mL — ABNORMAL HIGH (ref 0.30–0.70)

## 2022-08-28 LAB — APTT
aPTT: 65 seconds — ABNORMAL HIGH (ref 24–36)
aPTT: 55 seconds — ABNORMAL HIGH (ref 24–36)

## 2022-08-28 SURGERY — IMPLANT POCKET INCISION & DRAINAGE
Anesthesia: General

## 2022-08-28 SURGERY — IRRIGATION AND DEBRIDEMENT ABSCESS
Anesthesia: General | Site: Leg Upper | Laterality: Right

## 2022-08-28 MED ORDER — BUPIVACAINE-EPINEPHRINE (PF) 0.5% -1:200000 IJ SOLN
INTRAMUSCULAR | Status: DC | PRN
  Administered 2022-08-28: 15 mL via INTRAMUSCULAR

## 2022-08-28 MED ORDER — FENTANYL CITRATE (PF) 100 MCG/2ML IJ SOLN
INTRAMUSCULAR | Status: AC
  Administered 2022-08-28: 25 ug via INTRAVENOUS
  Filled 2022-08-28: qty 2

## 2022-08-28 MED ORDER — BUPIVACAINE-EPINEPHRINE (PF) 0.5% -1:200000 IJ SOLN
INTRAMUSCULAR | Status: AC
  Filled 2022-08-28: qty 30

## 2022-08-28 MED ORDER — OXYCODONE HCL 5 MG PO TABS: 5.0000 mg | ORAL_TABLET | Freq: Once | ORAL | Status: DC | PRN

## 2022-08-28 MED ORDER — VANCOMYCIN HCL 1500 MG/300ML IV SOLN
1500.0000 mg | INTRAVENOUS | Status: DC
  Administered 2022-08-29: 1500 mg via INTRAVENOUS
  Filled 2022-08-28 (×2): qty 300

## 2022-08-28 MED ORDER — DEXMEDETOMIDINE HCL IN NACL 200 MCG/50ML IV SOLN
INTRAVENOUS | Status: DC | PRN
  Administered 2022-08-28: 8 ug via INTRAVENOUS

## 2022-08-28 MED ORDER — KETAMINE HCL 50 MG/5ML IJ SOSY
PREFILLED_SYRINGE | INTRAMUSCULAR | Status: AC
  Filled 2022-08-28: qty 5

## 2022-08-28 MED ORDER — ACETAMINOPHEN 10 MG/ML IV SOLN: 1000.0000 mg | Freq: Once | INTRAVENOUS | Status: DC | PRN

## 2022-08-28 MED ORDER — PROPOFOL 10 MG/ML IV BOLUS
INTRAVENOUS | Status: DC | PRN
  Administered 2022-08-28 (×2): 50 mg via INTRAVENOUS

## 2022-08-28 MED ORDER — FENTANYL CITRATE (PF) 100 MCG/2ML IJ SOLN
INTRAMUSCULAR | Status: AC
  Filled 2022-08-28: qty 2

## 2022-08-28 MED ORDER — MORPHINE SULFATE (PF) 2 MG/ML IV SOLN
2.0000 mg | Freq: Once | INTRAVENOUS | Status: AC
  Administered 2022-08-28: 2 mg via INTRAVENOUS

## 2022-08-28 MED ORDER — FENTANYL CITRATE (PF) 100 MCG/2ML IJ SOLN
25.0000 ug | INTRAMUSCULAR | Status: DC | PRN
  Administered 2022-08-28 (×2): 25 ug via INTRAVENOUS

## 2022-08-28 MED ORDER — GLYCOPYRROLATE 0.2 MG/ML IJ SOLN
INTRAMUSCULAR | Status: DC | PRN
  Administered 2022-08-28: .2 mg via INTRAVENOUS

## 2022-08-28 MED ORDER — ONDANSETRON HCL 4 MG/2ML IJ SOLN
INTRAMUSCULAR | Status: DC | PRN
  Administered 2022-08-28: 4 mg via INTRAVENOUS

## 2022-08-28 MED ORDER — LACTATED RINGERS IV SOLN: INTRAVENOUS | Status: DC

## 2022-08-28 MED ORDER — MIDAZOLAM HCL 2 MG/2ML IJ SOLN
INTRAMUSCULAR | Status: AC
  Filled 2022-08-28: qty 2

## 2022-08-28 MED ORDER — EPHEDRINE SULFATE (PRESSORS) 50 MG/ML IJ SOLN
INTRAMUSCULAR | Status: DC | PRN
  Administered 2022-08-28 (×2): 5 mg via INTRAVENOUS

## 2022-08-28 MED ORDER — DEXAMETHASONE SODIUM PHOSPHATE 10 MG/ML IJ SOLN
INTRAMUSCULAR | Status: DC | PRN
  Administered 2022-08-28: 10 mg via INTRAVENOUS

## 2022-08-28 MED ORDER — LIDOCAINE HCL (PF) 1 % IJ SOLN
INTRAMUSCULAR | Status: AC
  Filled 2022-08-28: qty 30

## 2022-08-28 MED ORDER — HEPARIN BOLUS VIA INFUSION
2250.0000 [IU] | Freq: Once | INTRAVENOUS | Status: AC
  Administered 2022-08-28: 2250 [IU] via INTRAVENOUS
  Filled 2022-08-28: qty 2250

## 2022-08-28 MED ORDER — LACTATED RINGERS IV SOLN: INTRAVENOUS | Status: DC | PRN

## 2022-08-28 MED ORDER — KETAMINE HCL 10 MG/ML IJ SOLN
INTRAMUSCULAR | Status: DC | PRN
  Administered 2022-08-28 (×3): 10 mg via INTRAVENOUS

## 2022-08-28 MED ORDER — OXYCODONE HCL 5 MG/5ML PO SOLN: 5.0000 mg | Freq: Once | ORAL | Status: DC | PRN

## 2022-08-28 MED ORDER — PROPOFOL 500 MG/50ML IV EMUL
INTRAVENOUS | Status: DC | PRN
  Administered 2022-08-28: 145 ug/kg/min via INTRAVENOUS

## 2022-08-28 MED ORDER — PHENYLEPHRINE 80 MCG/ML (10ML) SYRINGE FOR IV PUSH (FOR BLOOD PRESSURE SUPPORT)
PREFILLED_SYRINGE | INTRAVENOUS | Status: DC | PRN
  Administered 2022-08-28 (×2): 160 ug via INTRAVENOUS

## 2022-08-28 MED ORDER — LIDOCAINE HCL 1 % IJ SOLN
INTRAMUSCULAR | Status: DC | PRN
  Administered 2022-08-28: 15 mL

## 2022-08-28 MED ORDER — ONDANSETRON HCL 4 MG/2ML IJ SOLN: 4.0000 mg | Freq: Once | INTRAMUSCULAR | Status: DC | PRN

## 2022-08-28 SURGICAL SUPPLY — 32 items
BLADE SURG 15 STRL LF DISP TIS (BLADE) ×2 IMPLANT
BLADE SURG 15 STRL SS (BLADE) ×2
CANISTER WOUND CARE 500ML ATS (WOUND CARE) ×2 IMPLANT
CHLORAPREP W/TINT 26 (MISCELLANEOUS) ×2 IMPLANT
DRAPE INCISE IOBAN 66X45 STRL (DRAPES) IMPLANT
DRAPE LAPAROTOMY 100X77 ABD (DRAPES) ×2 IMPLANT
DRSG VAC ATS MED SENSATRAC (GAUZE/BANDAGES/DRESSINGS) ×2 IMPLANT
ELECT REM PT RETURN 9FT ADLT (ELECTROSURGICAL) ×2
ELECTRODE REM PT RTRN 9FT ADLT (ELECTROSURGICAL) ×2 IMPLANT
GAUZE 4X4 16PLY ~~LOC~~+RFID DBL (SPONGE) ×2 IMPLANT
GAUZE SPONGE 4X4 12PLY STRL (GAUZE/BANDAGES/DRESSINGS) ×2 IMPLANT
GLOVE BIO SURGEON STRL SZ7 (GLOVE) ×4 IMPLANT
GOWN STRL REUS W/ TWL LRG LVL3 (GOWN DISPOSABLE) ×4 IMPLANT
GOWN STRL REUS W/ TWL XL LVL3 (GOWN DISPOSABLE) ×2 IMPLANT
GOWN STRL REUS W/TWL LRG LVL3 (GOWN DISPOSABLE) ×2
GOWN STRL REUS W/TWL XL LVL3 (GOWN DISPOSABLE) ×2
KIT TURNOVER KIT A (KITS) ×2 IMPLANT
LABEL OR SOLS (LABEL) ×2 IMPLANT
MANIFOLD NEPTUNE II (INSTRUMENTS) ×2 IMPLANT
NDL HYPO 25X1 1.5 SAFETY (NEEDLE) IMPLANT
NEEDLE HYPO 25X1 1.5 SAFETY (NEEDLE) IMPLANT
NS IRRIG 500ML POUR BTL (IV SOLUTION) ×2 IMPLANT
PACK BASIN MINOR ARMC (MISCELLANEOUS) ×2 IMPLANT
SOL PREP PVP 2OZ (MISCELLANEOUS) ×2
SOLUTION PREP PVP 2OZ (MISCELLANEOUS) ×2 IMPLANT
SUT VIC AB 2-0 CT1 (SUTURE) IMPLANT
SUT VICRYL+ 3-0 36IN CT-1 (SUTURE) IMPLANT
SWAB CULTURE AMIES ANAERIB BLU (MISCELLANEOUS) ×2 IMPLANT
SYR 10ML LL (SYRINGE) IMPLANT
SYR BULB IRRIG 60ML STRL (SYRINGE) ×2 IMPLANT
TRAP FLUID SMOKE EVACUATOR (MISCELLANEOUS) ×2 IMPLANT
WATER STERILE IRR 500ML POUR (IV SOLUTION) ×2 IMPLANT

## 2022-08-28 NOTE — Transfer of Care (Signed)
Immediate Anesthesia Transfer of Care Note  Patient: Carla [name]  Procedure(s) Performed: AKA IRRIGATION AND DEBRIDEMENT ABSCESS (Right) APPLICATION OF WOUND VAC (Right: Leg Upper)  Patient Location: PACU  Anesthesia Type:General  Level of Consciousness: drowsy and patient cooperative  Airway & Oxygen Therapy: Patient Spontanous Breathing and Patient connected to face mask oxygen  Post-op Assessment: Report given to RN and Post -op Vital signs reviewed and stable  Post vital signs: Reviewed and stable  Last Vitals:  Vitals Value Taken Time  BP    Temp    Pulse    Resp    SpO2      Last Pain:  Vitals:   08/28/22 1131  TempSrc: Temporal  PainSc: 7          Complications: No notable events documented.

## 2022-08-28 NOTE — Evaluation (Signed)
Physical Therapy Evaluation Patient Details Name: Carla [name] MRN: [id number] DOB: 03-15-1961 Today's Date: 08/28/2022  History of Present Illness  Pt is a 61 y.o. female with medical history significant of PAD status post right AKA and left axillary femoral bypass graft, diastolic CHF, COPD, and superior mesenteric artery thrombus and atrial fibrillation on Eliquis, hypertension, hyperlipidemia, depression, CAD, obesity, chronic pain, anemia, RLE abscess at AKA site, who presented with increasing pain in right AKA stump.  MD assessment includes: Pain in residual limb, fluid collection on CT concern for possible abscess, chronic mesenteric ischemia, hypokalemia, and iron deficiency anemia. Pt scheduled for RLE I&D 08/28/22.   Clinical Impression  Pt was pleasant and motivated to participate during the session and put forth good effort throughout. Pt required physical assistance for BLE and trunk management with bed mobility training but once in sitting at the EOB was able to sit with good stability and without adverse symptoms other than RLE pain that did not worsen with activity.  Pt eager to attempt to stand but even with +2 assist she was unable to clear the surface of the bed during attempts.  Pt will benefit from PT services in a SNF setting upon discharge to safely address deficits listed in patient problem list for decreased caregiver assistance and eventual return to PLOF.         Recommendations for follow up therapy are one component of a multi-disciplinary discharge planning process, led by the attending physician.  Recommendations may be updated based on patient status, additional functional criteria and insurance authorization.  Follow Up Recommendations Skilled nursing-short term rehab (<3 hours/day) Can patient physically be transported by private vehicle: No    Assistance Recommended at Discharge Frequent or constant Supervision/Assistance  Patient can return home with the  following  Two people to help with walking and/or transfers;Two people to help with bathing/dressing/bathroom;Assist for transportation;Help with stairs or ramp for entrance;Assistance with cooking/housework    Equipment Recommendations Other (comment) (TBD at next venue of care)  Recommendations for Other Services       Functional Status Assessment Patient has had a recent decline in their functional status and demonstrates the ability to make significant improvements in function in a reasonable and predictable amount of time.     Precautions / Restrictions Precautions Precautions: Fall Restrictions Weight Bearing Restrictions: No      Mobility  Bed Mobility Overal bed mobility: Needs Assistance Bed Mobility: Sit to Supine, Supine to Sit     Supine to sit: Min assist Sit to supine: Min assist   General bed mobility comments: Min A for BLE and trunk control    Transfers                   General transfer comment: +2 assist provided during sit to stand attempt but pt unable to clear surface of bed    Ambulation/Gait                  Stairs            Wheelchair Mobility    Modified Rankin (Stroke Patients Only)       Balance Overall balance assessment: Needs assistance Sitting-balance support: Feet unsupported, Single extremity supported Sitting balance-[name]: Good         Standing balance comment: Unable to stand                             Pertinent  Vitals/Pain Pain Assessment Pain Assessment: 0-10 Pain Score: 9  Pain Location: RLE Pain Descriptors / Indicators: Aching, Sore Pain Intervention(s): Premedicated before session, Monitored during session, Limited activity within patient's tolerance    Home Living Family/patient expects to be discharged to:: Private residence Living Arrangements: Children;Other relatives Available Help at Discharge: Family;Available PRN/intermittently Type of Home: Mobile  home Home Access: Level entry       Home Layout: One level Home Equipment: Wheelchair - manual;Shower seat      Prior Function Prior Level of Function : Needs assist             Mobility Comments: Pt requires min A for stand/squat pivot transfers to/from wheelchair; requires PRN assist for w/c mobility in the home ADLs Comments: Daughter assists with ADLs as needed     Hand Dominance   Dominant Hand: Right    Extremity/Trunk Assessment   Upper Extremity Assessment Upper Extremity Assessment: Generalized weakness    Lower Extremity Assessment Lower Extremity Assessment: Generalized weakness       Communication   Communication: No difficulties  Cognition Arousal/Alertness: Awake/alert Behavior During Therapy: WFL for tasks assessed/performed Overall Cognitive Status: Within Functional Limits for tasks assessed                                          General Comments      Exercises Total Joint Exercises Ankle Circles/Pumps: AROM, Strengthening, Left, 10 reps Quad Sets: Strengthening, Left, 10 reps Gluteal Sets: Strengthening, Both, 10 reps Hip ABduction/ADduction: AAROM, Left, 5 reps Long Arc Quad: AROM, Strengthening, Left, 10 reps Knee Flexion: AROM, Strengthening, Left, 10 reps   Assessment/Plan    PT Assessment Patient needs continued PT services  PT Problem List Decreased strength;Decreased activity tolerance;Decreased balance;Decreased mobility;Decreased knowledge of use of DME;Pain       PT Treatment Interventions DME instruction;Functional mobility training;Therapeutic activities;Therapeutic exercise;Balance training;Patient/family education    PT Goals (Current goals can be found in the Care Plan section)  Acute Rehab PT Goals Patient Stated Goal: To get stronger and get back home PT Goal Formulation: With patient Time For Goal Achievement: 09/10/22 Potential to Achieve Goals: Fair    Frequency Min 2X/week      Co-evaluation               AM-PAC PT "6 Clicks" Mobility  Outcome Measure Help needed turning from your back to your side while in a flat bed without using bedrails?: A Lot Help needed moving from lying on your back to sitting on the side of a flat bed without using bedrails?: A Lot Help needed moving to and from a bed to a chair (including a wheelchair)?: Total Help needed standing up from a chair using your arms (e.g., wheelchair or bedside chair)?: Total Help needed to walk in hospital room?: Total Help needed climbing 3-5 steps with a railing? : Total 6 Click Score: 8    End of Session Equipment Utilized During Treatment: Gait belt Activity Tolerance: Patient tolerated treatment well Patient left: in bed;with call bell/phone within reach;with bed alarm set Nurse Communication: Mobility status PT Visit Diagnosis: Other abnormalities of gait and mobility (R26.89);Muscle weakness (generalized) (M62.81);Pain Pain - Right/Left: Right Pain - part of body: Leg    Time: 5400-8676 PT Time Calculation (min) (ACUTE ONLY): 26 min   Charges:   PT Evaluation $PT Eval Moderate Complexity: 1 Mod PT Treatments $Therapeutic  Exercise: 8-22 mins        D. Royetta Asal PT, DPT 08/28/22, 12:11 PM

## 2022-08-28 NOTE — Progress Notes (Signed)
[facility] [place] for heparin infusion initiation and monitoring Indication: hx of superior mesenteric artery thrombosis and A fib   Allergies  Allergen Reactions   Gabapentin     Tremors    Bactrim [Sulfamethoxazole-Trimethoprim] Itching    Patient Measurements: Height: '5\' 2"'$  (157.5 cm) Weight: 104.8 kg (231 lb 0.7 oz) IBW/kg (Calculated) : 50.1 Heparin Dosing Weight: 75 kg  Vital Signs: Temp: 98.1 F (36.7 C) (11/06 1556) Temp Source: Temporal (11/06 1131) BP: 129/65 (11/06 2034) Pulse Rate: 105 (11/06 2034)  Labs: Recent Labs    08/26/22 0810 08/26/22 1316 08/26/22 1922 08/27/22 0533 08/28/22 0319 08/28/22 0849 08/28/22 2201  HGB 10.7*  --   --  10.4* 10.6*  --   --   HCT 33.6*  --   --  33.0* 33.6*  --   --   PLT 232  --   --  238 230  --   --   APTT  --  >200*   < > 85* 65*  --  55*  LABPROT  --  16.2*  --   --   --   --   --   INR  --  1.3*  --   --   --   --   --   HEPARINUNFRC  --  >1.10*  --  >1.10* 1.10*  --   --   CREATININE 0.83  --   --  0.73  --  0.70  --    < > = values in this interval not displayed.     Estimated Creatinine Clearance: 82.9 mL/min (by C-G formula based on SCr of 0.7 mg/dL).   Medical History: Past Medical History:  Diagnosis Date   Abdominal aortic atherosclerosis ([place])    a. 05/2017 CTA abd/pelvis: significant atherosclerotic dzs of the infrarenal abd Ao w/ some mural thrombus. No aneurysm or dissection.   Acute focal ischemia of small intestine (HCC)    Acute right-sided low back pain with right-sided sciatica 06/13/2017   AKI (acute kidney injury) ([place]) 07/29/2017   Anemia    Anginal pain ([place])    a. 08/2012 Lexiscan MV: EF 54%, non ischemia/infarct.   Anxiety and depression    Baker's cyst of knee, right    a. 07/2016 U/S: 4.1 x 1.4 x 2.9 cystic structure in R poplitetal fossa.   Bell's palsy    CAD (coronary artery disease)    CHF (congestive heart failure) ([place])    a.) TTE 06/20/2017:  EF 60-65%, no rwma, G1DD, no source of cardiac emboli.   Chronic anticoagulation    a.) apixaban   Chronic idiopathic constipation    Chronic mesenteric ischemia (HCC)    Chronic pain    COPD (chronic obstructive pulmonary disease) (HCC)    Dyspnea    Embolus of superior mesenteric artery ([place])    a. 05/2017 CTA Abd/pelvis: apparent thrombus or embolus in prox SMA (70-90%); b. 05/2017 catheter directed tPA, mechanical thrombectomy, and stenting of the SMA.   Essential hypertension with goal blood pressure less than 140/90 01/19/2016   Gastroesophageal reflux disease 01/19/2016   GERD (gastroesophageal reflux disease)    H/O colectomy    History of 2019 novel coronavirus disease (COVID-19) 11/14/2019   History of kidney stones    Hyperlipidemia    Hypertension    Morbid obesity with BMI of 40.0-44.9, adult ([place])    NSTEMI (non-ST elevated myocardial infarction) ([place]) 06/27/2021   a.) high sensitivity troponins trended: 893 --> 1587 -->  1748 --> 868 --> 735 ng/L   Occlusive mesenteric ischemia (HCC) 06/19/2017   Osteoarthritis    PAD (peripheral artery disease) (HCC)    PAF (paroxysmal atrial fibrillation) (HCC)    a.) CHA2DS2-VASc = 6 (sex, CHF, HTN, DVT x2, aortic plaque). b.) rate/rhythm maintained on oral metoprolol tartrate; chronically anticoagulated with standard dose apixaban.   Palpitations    Peripheral edema    Personal history of other malignant neoplasm of skin 06/01/2016   Pneumonia    Pre-diabetes    Primary osteoarthritis of both knees 06/13/2017   SBO (small bowel obstruction) ([place]) 08/06/2017   Skin cancer of nose    Superior mesenteric artery thrombosis (HCC)    Vitamin D deficiency     Assessment: 61 y.o. female with medical history significant for peripheral arterial disease s/p right AKA, chronic diastolic dysfunction CHF, anxiety and depression, COPD, paroxysmal A-fib on chronic anticoagulation therapy, who presents to the ED with  increasing pain in her right  AKA stump. A review of medical records reveals apixaban therapy prior to arrival (unknown last dose)   Goal of Therapy: Heparin Dosing Weight: 75 kg Heparin level 0.3-0.7 units/ml aPTT 66 - 102 seconds Monitor platelets by anticoagulation protocol: Yes   Date/Time  aPTT/HL  Comment 11/04 1922  aPTT=83  Therapeutic x1  at 1400 un/hr 11/05 0533  85 / >1.1  aPTT therapeutic x 2 11/06 0319  65 / 1.1  aPTT, subtherapeutic 1550 Heparin paused / resumed on 11/06 1530 at prior 1550 11/06 2201  55s / ---  aPTT, subtherapeutic 1550 > 1800 un/h  Plan:  Last eliquis '5mg'$  dose given on 11/3 1700. HL persistently elevated. Will CTM dose by aPTT & check daily HL in AM.  Bolus 2250 units x1; then increase heparin infusion rate to 1800 units/hr Recheck aPTT/HL in 6 hrs after rate change Continue to recheck HL daily until correlation w/ aPTT confirmed Continue to monitor H&H and platelets  Lorna Dibble, PharmD, [facility] Clinical Pharmacist 08/28/2022 10:39 PM

## 2022-08-28 NOTE — Consult Note (Signed)
Pharmacy Antibiotic Note  Carla [name] is a 61 y.o. female  w/ h/o peripheral arterial disease s/p right AKA, chronic diastolic dysfunction CHF, anxiety and depression, COPD, paroxysmal A-fib on chronic anticoagulation therapy, who presents to the ED with increasing pain in her right AKA stump with abscess. Pharmacy has been consulted for vancomycin dosing.  Plan: Adjust Vancomycin 1500 mg every 24 hours Goal AUC 400-600  Est AUC: 547.7;  Cmin: 12.4 SCr 0.8; TBW, Vd 0.5 Pt also received Zosyn 3.375g IV x1; followed by ceftriaxone 2g IV q24h   Follow up renal function daily and antibiotic plans post procedure.  Height: '5\' 2"'$  (157.5 cm) Weight: 104.8 kg (231 lb 0.7 oz) IBW/kg (Calculated) : 50.1  Temp (24hrs), Avg:98.2 F (36.8 C), Min:97.8 F (36.6 C), Max:98.7 F (37.1 C)  Recent Labs  Lab 08/26/22 0810 08/27/22 0533 08/28/22 0319 08/28/22 0849  WBC 7.2 5.9 7.0  --   CREATININE 0.83 0.73  --  0.70     Estimated Creatinine Clearance: 82.9 mL/min (by C-G formula based on SCr of 0.7 mg/dL).    Allergies  Allergen Reactions   Gabapentin     Tremors    Bactrim [Sulfamethoxazole-Trimethoprim] Itching    Antimicrobials this admission: VAN/ZOS x1 (11/4) VAN/CRO (11/4 >>   Dose adjustments this admission: CTM and adjust PRN  Microbiology results: 11/4 BCx: sent 11/4 MRSA PCR: Negative 11/4 Covid PCR: negative  Thank you for allowing pharmacy to be a part of this patient's care.  Wynelle Cleveland 08/28/2022 11:03 AM

## 2022-08-28 NOTE — Progress Notes (Signed)
PROGRESS NOTE    Carla [name]   PNT:[id number] DOB: 22-Dec-1960  DOA: 08/26/2022 Date of Service: 08/28/22 PCP: [facility] [place]     Brief Narrative / Hospital Course:  Carla [name] is a 61 y.o. female with medical history significant of PAD status post right AKA and left axillary femoral bypass graft, diastolic CHF, COPD, and superior mesenteric artery thrombus and atrial fibrillation on Eliquis, hypertension, hyperlipidemia, depression, CAD, obesity, chronic pain, anemia, right knee abscess at AKA site, who present with increasing pain in right AKA stump. Pt had abscess of right knee at AKA site recently. She completed a course of Abx. Now comes back with worsening pain in that site in the past several days. Patient also has erythema at right AKA stump site 11/04: in ED - WBC 7.2, potassium 3.1, GFR> 60, temperature normal, blood pressure 113/67, heart rate 83, RR 24, oxygen saturation 93% on room air.  Negative COVID PCR.  X-ray of left femur is negative for bony fracture. CT R femur (+) "Complex mixed attenuation fluid collection surrounding the distal femoral resection margin has increased in size from prior... nonspecific and could represent an adventitial bursa, which could be sterile or infected. Abscess is also a concern, although there is relatively little surrounding inflammatory changes." Patient is admitted to telemetry bed as inpatient. VVS, Dr. Trula [name] is consulted.  11/05: no concerns on labs/VS. Vascular surgery planning I&D tomorrow  11/06: underwent I&D with evacuation of hematoma right thigh, concern for infection, cultures collected.   Consultants:  Vein/Vascular Surgery   Procedures: 11/06: I&D with evacuation of hematoma right thigh - Dr Carla [name]      ASSESSMENT & PLAN:   Principal Problem:   Abscess of knee, right Active Problems:   Chronic mesenteric ischemia (HCC)   PAD (peripheral artery disease) (HCC)   CAD (coronary artery disease)   Chronic  diastolic CHF (congestive heart failure) (HCC)   PAF (paroxysmal atrial fibrillation) (HCC)   COPD (chronic obstructive pulmonary disease) (HCC)   HLD (hyperlipidemia)   Recurrent major depressive disorder (HCC)   Chronic pain   Hypokalemia   Iron deficiency anemia   Morbid obesity with BMI of 40.0-44.9, adult ([place])  S/p R BKA Pain in stump, fluid collection on CT concern for possible abscess of knee, right Does NOT meet criteria for sepsis.   Admitting physician consulted Dr. Pascal [name] of IR for possible drainage, but he recommended surgical debridement.   Admitting physician consulted Dr. Trula [name] of vascular surgery --> s/p I&D 11/06 Cultures collected intra-op 11/06 Vancomycin and Rocephin (patient received 1 dose of Zosyn in ED) blood cultures pending  pain control: Continue home MS Contin and Norco, start as needed morphine   Chronic mesenteric ischemia switched Eliquis to IV heparin   PAD (peripheral artery disease) [facility]):  s/p of left axillary femoral bypass graft, Aspirin, Lipitor   CAD (coronary artery disease):  no CP Aspirin and Lipitor, imdur   Chronic diastolic CHF (congestive heart failure) ([place]):  2D echo on 06/15/2018 showed EF of 60 to 65%.   No signs of CHF exacerbation. Continue home Lasix 20 mg daily and metolazone 2.5 mg daily   PAF (paroxysmal atrial fibrillation) (HCC) Switch Eliquis to IV heparin as above Metoprolol   COPD (chronic obstructive pulmonary disease) (HCC): Stable Bronchodilators   HLD (hyperlipidemia) Lipitor   Recurrent major depressive disorder ([place]) Continue home medications   Chronic pain: Continue home MS Contin and Norco   Hypokalemia - resolved Potassium 3.1 on admission  Repleted potassium Check a magnesium level --> WNL Follow BMP   Iron deficiency anemia:  Hemoglobin stable.  10.7 (8.1 on 08/09/2022) Continue home iron supplement   Morbid obesity with BMI of 40.0-44.9, adult [facility]):   BMI= 41.37  and BW= 102.6  Kg Diet and exercise.   Encouraged to lose weight.    DVT prophylaxis: heparin (pending possible surgical intervention) --> restart Eliquis likely tomorrow / day after  Pertinent IV fluids/nutrition: no conitnuous IV fluids Central lines / invasive devices: none  Code Status: FULL CODE Family Communication: none at this time   Disposition: anticipate d/c back to previous SNF TOC needs: confirm SNF on eventual discharge  Barriers to discharge / significant pending items: pending VVS possible intervention, on IV abx pending BCx and hematoma Cx from surgery, anticipate d/c in 1-2 days              Subjective:  Patient resting comfortably today, no concerns from RN.        Objective:  Vitals:   08/28/22 1400 08/28/22 1415 08/28/22 1430 08/28/22 1453  BP: 93/60 (!) 99/59 (!) 103/58 112/64  Pulse: 89 94 95 84  Resp: '14 18 20 17  '$ Temp:   (!) 97 F (36.1 C) 97.8 F (36.6 C)  TempSrc:      SpO2: 94% 94% 94% 94%  Weight:      Height:        Intake/Output Summary (Last 24 hours) at 08/28/2022 1531 Last data filed at 08/28/2022 1333 Gross per 24 hour  Intake 1207.23 ml  Output 900 ml  Net 307.23 ml   Filed Weights   08/27/22 0346 08/28/22 0500 08/28/22 1131  Weight: 101.7 kg 104.8 kg 104.8 kg    Examination:  Constitutional:  VS as above General Appearance: obese, NAD Respiratory: Normal respiratory effort No wheeze Cardiovascular: S1/S2 normal        Scheduled Medications:   aspirin EC  81 mg Oral Daily   atorvastatin  80 mg Oral QHS   citalopram  40 mg Oral Daily   ferrous sulfate  325 mg Oral Daily   furosemide  20 mg Oral Daily   Ipratropium-Albuterol  1 puff Inhalation Q6H   isosorbide mononitrate  60 mg Oral Daily   lidocaine  1 patch Transdermal Q24H   metolazone  2.5 mg Oral Daily   metoprolol tartrate  12.5 mg Oral BID   morphine  15 mg Oral Q12H   multivitamin with minerals  1 tablet Oral Daily   pantoprazole  40 mg Oral Daily    polyethylene glycol  17 g Oral Daily   pregabalin  200 mg Oral TID   senna-docusate  1 tablet Oral QHS    Continuous Infusions:  sodium chloride Stopped (08/26/22 2254)   cefTRIAXone (ROCEPHIN)  IV Stopped (08/27/22 1925)   heparin Stopped (08/28/22 1126)   vancomycin      PRN Medications:  sodium chloride, acetaminophen, albuterol, alum & mag hydroxide-simeth, dextromethorphan-guaiFENesin, diphenhydrAMINE, docusate sodium, hydrALAZINE, HYDROcodone-acetaminophen, morphine injection, ondansetron (ZOFRAN) IV  Antimicrobials:  Anti-infectives (From admission, onward)    Start     Dose/Rate Route Frequency Ordered Stop   08/28/22 1200  vancomycin (VANCOREADY) IVPB 1500 mg/300 mL        1,500 mg 150 mL/hr over 120 Minutes Intravenous Every 24 hours 08/28/22 1104     08/27/22 1215  vancomycin (VANCOCIN) IVPB 1000 mg/200 mL premix  Status:  Discontinued        1,000 mg 200 mL/hr over  60 Minutes Intravenous Every 12 hours 08/27/22 1126 08/28/22 1104   08/26/22 1930  vancomycin (VANCOCIN) IVPB 1000 mg/200 mL premix        1,000 mg 200 mL/hr over 60 Minutes Intravenous  Once 08/26/22 1836 08/26/22 2216   08/26/22 1900  cefTRIAXone (ROCEPHIN) 2 g in sodium chloride 0.9 % 100 mL IVPB        2 g 200 mL/hr over 30 Minutes Intravenous Every 24 hours 08/26/22 1830     08/26/22 1045  vancomycin (VANCOCIN) IVPB 1000 mg/200 mL premix        1,000 mg 200 mL/hr over 60 Minutes Intravenous  Once 08/26/22 1035 08/26/22 1211   08/26/22 1045  piperacillin-tazobactam (ZOSYN) IVPB 3.375 g        3.375 g 100 mL/hr over 30 Minutes Intravenous  Once 08/26/22 1035 08/26/22 1211       Data Reviewed: I have personally reviewed following labs and imaging studies  CBC: Recent Labs  Lab 08/26/22 0810 08/27/22 0533 08/28/22 0319  WBC 7.2 5.9 7.0  NEUTROABS 4.0  --   --   HGB 10.7* 10.4* 10.6*  HCT 33.6* 33.0* 33.6*  MCV 91.3 91.9 91.6  PLT 232 238 737   Basic Metabolic Panel: Recent Labs  Lab  08/26/22 0810 08/26/22 1303 08/27/22 0533 08/28/22 0849  NA 136  --  139 139  K 3.1*  --  3.7 3.7  CL 95*  --  100 99  CO2 31  --  26 30  GLUCOSE 117*  --  127* 109*  BUN 19  --  18 21  CREATININE 0.83  --  0.73 0.70  CALCIUM 9.1  --  9.2 9.1  MG  --  2.0  --   --    GFR: Estimated Creatinine Clearance: 82.9 mL/min (by C-G formula based on SCr of 0.7 mg/dL). Liver Function Tests: No results for input(s): "AST", "ALT", "ALKPHOS", "BILITOT", "PROT", "ALBUMIN" in the last 168 hours. No results for input(s): "LIPASE", "AMYLASE" in the last 168 hours. No results for input(s): "AMMONIA" in the last 168 hours. Coagulation Profile: Recent Labs  Lab 08/26/22 1316  INR 1.3*   Cardiac Enzymes: No results for input(s): "CKTOTAL", "CKMB", "CKMBINDEX", "TROPONINI" in the last 168 hours. BNP (last 3 results) No results for input(s): "PROBNP" in the last 8760 hours. HbA1C: No results for input(s): "HGBA1C" in the last 72 hours. CBG: Recent Labs  Lab 08/27/22 0726 08/28/22 0730 08/28/22 1355  GLUCAP 105* 125* 126*   Lipid Profile: No results for input(s): "CHOL", "HDL", "LDLCALC", "TRIG", "CHOLHDL", "LDLDIRECT" in the last 72 hours. Thyroid Function Tests: No results for input(s): "TSH", "T4TOTAL", "FREET4", "T3FREE", "THYROIDAB" in the last 72 hours. Anemia Panel: No results for input(s): "VITAMINB12", "FOLATE", "FERRITIN", "TIBC", "IRON", "RETICCTPCT" in the last 72 hours. Urine analysis:    Component Value Date/Time   COLORURINE AMBER (A) 03/22/2022 1520   APPEARANCEUR CLOUDY (A) 03/22/2022 1520   APPEARANCEUR Clear 01/19/2015 1112   LABSPEC 1.016 03/22/2022 1520   LABSPEC 1.019 01/19/2015 1112   PHURINE 6.0 03/22/2022 1520   GLUCOSEU NEGATIVE 03/22/2022 1520   GLUCOSEU Negative 01/19/2015 1112   HGBUR LARGE (A) 03/22/2022 1520   BILIRUBINUR NEGATIVE 03/22/2022 1520   BILIRUBINUR Negative 01/19/2015 1112   KETONESUR NEGATIVE 03/22/2022 1520   PROTEINUR 30 (A)  03/22/2022 1520   NITRITE POSITIVE (A) 03/22/2022 1520   LEUKOCYTESUR LARGE (A) 03/22/2022 1520   LEUKOCYTESUR Negative 01/19/2015 1112   Sepsis Labs: '@LABRCNTIP'$ (procalcitonin:4,lacticidven:4)  Recent Results (  from the past 240 hour(s))  SARS Coronavirus 2 by RT PCR (hospital order, performed in [facility] hospital lab) *cepheid single result test* Anterior Nasal Swab     Status: None   Collection Time: 08/26/22  1:03 PM   Specimen: Anterior Nasal Swab  Result Value Ref Range Status   SARS Coronavirus 2 by RT PCR NEGATIVE NEGATIVE Final    Comment: (NOTE) SARS-CoV-2 target nucleic acids are NOT DETECTED.  The SARS-CoV-2 RNA is generally detectable in upper and lower respiratory specimens during the acute phase of infection. The lowest concentration of SARS-CoV-2 viral copies this assay can detect is 250 copies / mL. A negative result does not preclude SARS-CoV-2 infection and should not be used as the sole basis for treatment or other patient management decisions.  A negative result may occur with improper specimen collection / handling, submission of specimen other than nasopharyngeal swab, presence of viral mutation(s) within the areas targeted by this assay, and inadequate number of viral copies (<250 copies / mL). A negative result must be combined with clinical observations, patient history, and epidemiological information.  Fact Sheet for Patients:   https://www.patel.info/  Fact Sheet for Healthcare Providers: https://hall.com/  This test is not yet approved or  cleared by the [place] FDA and has been authorized for detection and/or diagnosis of SARS-CoV-2 by FDA under an Emergency Use Authorization (EUA).  This EUA will remain in effect (meaning this test can be used) for the duration of the COVID-19 declaration under Section 564(b)(1) of the Act, 21 U.S.C. section 360bbb-3(b)(1), unless the authorization is terminated  or revoked sooner.  Performed at [facility], [address], [place], [place] [address]   Surgical pcr screen     Status: None   Collection Time: 08/26/22  5:33 PM   Specimen: Nasal Mucosa; Nasal Swab  Result Value Ref Range Status   MRSA, PCR NEGATIVE NEGATIVE Final   Staphylococcus aureus NEGATIVE NEGATIVE Final    Comment: (NOTE) The Xpert SA Assay (FDA approved for NASAL specimens in patients 22 years of age and older), is one component of a comprehensive surveillance program. It is not intended to diagnose infection nor to guide or monitor treatment. Performed at [facility], [address], [place],  [address]          Radiology Studies: CT FEMUR RIGHT W CONTRAST  Result Date: 08/26/2022 CLINICAL DATA:  Right leg pain.  History of above knee amputation EXAM: CT OF THE LOWER RIGHT EXTREMITY WITH CONTRAST TECHNIQUE: Multidetector CT imaging of the lower right extremity was performed according to the standard protocol following intravenous contrast administration. RADIATION DOSE REDUCTION: This exam was performed according to the departmental dose-optimization program which includes automated exposure control, adjustment of the mA and/or kV according to patient size and/or use of iterative reconstruction technique. CONTRAST:  19m OMNIPAQUE IOHEXOL 300 MG/ML  SOLN COMPARISON:  X-ray 08/26/2022, CT 07/30/2022 FINDINGS: Bones/Joint/Cartilage Postsurgical changes of above knee amputation of the right leg. Stippled appearance of the anterior greater than posterior cortex of the distal femoral diaphysis is unchanged in appearance compared to the prior CT and may be related to demineralization (series 4, image 309). Erosive/destructive changes is felt to be less likely given the lack of interval change. Amputation margin remains sharp. Proximal femur is intact without fracture or dislocation. The visualized pelvis is intact without fracture or diastasis. No  lytic or sclerotic bone lesion. Ligaments Suboptimally assessed by CT. Muscles and Tendons Amputation changes of the distal  thigh musculotendinous structures with associated muscle atrophy. Soft tissues Complex mixed attenuation fluid collection surrounding the distal femoral resection margin has increased in size from prior, now measuring 7.0 x 6.0 x 6.7 cm (series 3, image 323), previously measured 4.5 x 2.7 x 2.3 cm. Collection demonstrates thick peripheral enhancement. No internal soft tissue gas. Relative lack of stranding/inflammatory changes within the adjacent subcutaneous fat. No right inguinal lymphadenopathy. Large ventral abdominal wall hernia containing non compromised appearing loops of large and small bowel. Partially imaged aortoiliac stent graft. IMPRESSION: 1. Postsurgical changes of above knee amputation of the right leg. 2. Complex mixed attenuation fluid collection surrounding the distal femoral resection margin has increased in size from prior, now measuring 7.0 x 6.0 x 6.7 cm, previously measured 4.5 x 2.7 x 2.3 cm. Appearance remains nonspecific and could represent an adventitial bursa, which could be sterile or infected. Abscess is also a concern, although there is relatively little surrounding inflammatory changes 3. Stippled appearance of the distal femoral diaphysis is unchanged in appearance compared to the prior CT and may be related to disuse demineralization. Erosive/destructive changes is felt to be less likely given the lack of interval change. 4. Large ventral abdominal wall hernia containing multiple non-compromised appearing loops of large and small bowel. Electronically Signed   By: Davina Poke D.O.   On: 08/26/2022 10:08   DG Femur Min 2 Views Right  Result Date: 08/26/2022 CLINICAL DATA:  RIGHT leg/stump pain.  History of AKA in June 2023. EXAM: RIGHT FEMUR 2 VIEWS COMPARISON:  07/30/2022 and prior studies FINDINGS: AKA changes again noted. There is no evidence of  acute fracture, subluxation, dislocation or definite radiographic evidence of osteomyelitis. No definite gas in the soft tissues noted. No unexpected radiopaque foreign bodies noted. IMPRESSION: AKA changes without acute abnormality. If there is strong clinical suspicion for acute osteomyelitis, recommend MRI. Electronically Signed   By: Margarette Canada M.D.   On: 08/26/2022 08:47            LOS: 2 days      Emeterio Reeve, DO [facility] 08/28/2022, 3:31 PM   Staff may message me via secure chat in [place]  but this may not receive immediate response,  please page for urgent matters!  If 7PM-7AM, please contact night-coverage [email or web address]  Dictation software was used to generate the above note. Typos may occur and escape review, as with typed/written notes. Please contact Dr Sheppard Coil directly for clarity if needed.

## 2022-08-28 NOTE — Op Note (Signed)
    OPERATIVE NOTE   PROCEDURE: I&D with evacuation of hematoma right thigh  PRE-OPERATIVE DIAGNOSIS: Fluid collection around the femur of her right above-knee amputation site  POST-OPERATIVE DIAGNOSIS: Same as above, no obvious infection  SURGEON: [name], MD  ASSISTANT(S): None  ANESTHESIA: MAC  ESTIMATED BLOOD LOSS: 20 cc  FINDING(S): No obvious infection, small hematoma present  SPECIMEN(S): Culture swab sent  INDICATIONS:   Carla [name] is a 61 y.o. female who presents with fluid collection around her femur and right amputation site pain.  There was concern for infection CT scan enlarged over time fluid collection infection.  Risks and benefits were discussed.  DESCRIPTION: After obtaining full informed written consent, the patient was brought back to the operating room and placed supine upon the operating table.  The patient received IV antibiotics prior to induction.  After obtaining adequate anesthesia, the patient was prepped and draped in the standard fashion.  I made a small transverse incision distal thigh about 7 to 8 cm superior to her previous amputation site which was healed.  Dissected down to the muscular fascia edge of the femur.  This was incised.  This expressed a hematoma that was not found to be grossly infected.  Culture swabs were sent.  The hematoma was evacuated with gentle pressure.  The wound was irrigated copiously with sterile saline.  After all clearly non-viable tissue was removed, I reapproximated with 2-0 Vicryl sutures.  The wound was irrigated.  As it is to medium VAC sponge and cut to fit the and a good occlusive seal was obtained   The patient was then awakened from anesthesia and taken to the recovery room in stable condition having tolerated the procedure well.  COMPLICATIONS: none  CONDITION: stable  [name]  08/28/2022, 1:38 PM   This note was created with Dragon Medical transcription system. Any errors in dictation are purely  unintentional.

## 2022-08-28 NOTE — Plan of Care (Signed)
?  Problem: Education: ?Goal: Knowledge of General Education information will improve ?Description: Including pain rating scale, medication(s)/side effects and non-pharmacologic comfort measures ?Outcome: Progressing ?  ?Problem: Nutrition: ?Goal: Adequate nutrition will be maintained ?Outcome: Progressing ?  ?Problem: Coping: ?Goal: Level of anxiety will decrease ?Outcome: Progressing ?  ?Problem: Elimination: ?Goal: Will not experience complications related to bowel motility ?Outcome: Progressing ?Goal: Will not experience complications related to urinary retention ?Outcome: Progressing ?  ?

## 2022-08-28 NOTE — Progress Notes (Signed)
       CROSS COVER NOTE  NAME: Carla [name] MRN: [id number] DOB : [birth year] ATTENDING PHYSICIAN: Emeterio Reeve, DO    Date of Service   08/28/2022   HPI/Events of Note   Medication request received for patient report of 9/10 pain while NPO.  Interventions   Assessment/Plan:  Morphine x1     This document was prepared using Dragon voice recognition software and may include unintentional dictation errors.  Neomia Glass DNP, MBA, FNP-BC Nurse Practitioner [facility] Pager [phone]

## 2022-08-28 NOTE — Anesthesia Procedure Notes (Signed)
Procedure Name: General with mask airway Date/Time: 08/28/2022 1:16 PM  Performed by: Kelton Pillar, CRNAPre-anesthesia Checklist: Patient identified, Emergency Drugs available, Suction available and Patient being monitored Patient Re-evaluated:Patient Re-evaluated prior to induction Oxygen Delivery Method: Simple face mask Induction Type: IV induction Placement Confirmation: positive ETCO2, CO2 detector and breath sounds checked- equal and bilateral Dental Injury: Teeth and Oropharynx as per pre-operative assessment

## 2022-08-28 NOTE — Anesthesia Postprocedure Evaluation (Signed)
Anesthesia Post Note  Patient: Carla [name]  Procedure(s) Performed: AKA IRRIGATION AND DEBRIDEMENT ABSCESS (Right) APPLICATION OF WOUND VAC (Right: Leg Upper)  Patient location during evaluation: PACU Anesthesia Type: General Level of consciousness: awake and alert Pain management: pain level controlled Vital Signs Assessment: post-procedure vital signs reviewed and stable Respiratory status: spontaneous breathing, nonlabored ventilation, respiratory function stable and patient connected to nasal cannula oxygen Cardiovascular status: blood pressure returned to baseline and stable Postop Assessment: no apparent nausea or vomiting Anesthetic complications: no   No notable events documented.   Last Vitals:  Vitals:   08/28/22 1131 08/28/22 1343  BP: (!) 97/49 (!) 96/40  Pulse: 71 96  Resp: 16 (!) 21  Temp: 36.8 C (!) 36.2 C  SpO2: 93% 100%    Last Pain:  Vitals:   08/28/22 1343  TempSrc:   PainSc: Asleep                 Arita Miss

## 2022-08-28 NOTE — Progress Notes (Signed)
OT Cancellation Note  Patient Details Name: DEAVEN BARRON MRN: [id number] DOB: 06-11-1961   Cancelled Treatment:    Reason Eval/Treat Not Completed: Patient at procedure or test/ unavailable  OT consult received.  Attempted to engage patient in evaluation, but patient leaving to go to OR for procedure upon attempt.  Will continue to follow up.  Jeneen Montgomery, OTR/L 08/28/22, 1:08 PM

## 2022-08-28 NOTE — Interval H&P Note (Signed)
History and Physical Interval Note:  08/28/2022 11:33 AM  Carla [name]  has presented today for surgery, with the diagnosis of AKA I and D Right Leg.  The various methods of treatment have been discussed with the patient and family. After consideration of risks, benefits and other options for treatment, the patient has consented to  Procedure(s): AKA IRRIGATION AND DEBRIDEMENT ABSCESS (Right) as a surgical intervention.  The patient's history has been reviewed, patient examined, no change in status, stable for surgery.  I have reviewed the patient's chart and labs.  Questions were answered to the patient's satisfaction.     [name]

## 2022-08-28 NOTE — Anesthesia Preprocedure Evaluation (Signed)
Anesthesia Evaluation  Patient identified by MRN, date of birth, ID bandGeneral Assessment Comment:Patient very drowsy, but Aox3 and answer questions appropriately.  Reviewed: Allergy & Precautions, NPO status , Patient's Chart, lab work & pertinent test results  History of Anesthesia Complications Negative for: history of anesthetic complications  Airway Mallampati: II  TM Distance: <3 FB Neck ROM: full    Dental  (+) Edentulous Upper, Edentulous Lower   Pulmonary shortness of breath, COPD, former smoker   Pulmonary exam normal        Cardiovascular hypertension, (-) angina + CAD, + Past MI (NSTEMI 06/2021), + Peripheral Vascular Disease and +CHF  Normal cardiovascular exam+ dysrhythmias Atrial Fibrillation   08/2012 Lexiscan MV: EF 54%, non ischemia/infarct   05/2017 CTA Abd/pelvis: apparent thrombus or embolus in prox SMA (70-90%); b. 05/2017 catheter directed tPA, mechanical thrombectomy, and stenting of the SMA.   TTE 06/20/2017: EF 60-65%, no rwma, G1DD, no source of cardiac emboli   Neuro/Psych  PSYCHIATRIC DISORDERS Anxiety Depression    Chronic pain syndrome  Neuromuscular disease    GI/Hepatic negative GI ROS, Neg liver ROS,GERD  Controlled,,  Endo/Other  negative endocrine ROS  Morbid obesity  Renal/GU Renal disease     Musculoskeletal  (+) Arthritis ,    Abdominal  (+) + obese  Peds  Hematology negative hematology ROS (+) Blood dyscrasia, anemia   Anesthesia Other Findings Past Medical History: No date: Abdominal aortic atherosclerosis ([place])     Comment:  a. 05/2017 CTA abd/pelvis: significant atherosclerotic               dzs of the infrarenal abd Ao w/ some mural thrombus. No               aneurysm or dissection. No date: Acute focal ischemia of small intestine ([place]) 06/13/2017: Acute right-sided low back pain with right-sided sciatica 07/29/2017: AKI (acute kidney injury) ([place]) No date: Anemia No date:  Anginal pain ([place])     Comment:  a. 08/2012 Lexiscan MV: EF 54%, non ischemia/infarct. No date: Anxiety and depression No date: Baker's cyst of knee, right     Comment:  a. 07/2016 U/S: 4.1 x 1.4 x 2.9 cystic structure in R               poplitetal fossa. No date: Bell's palsy No date: CAD (coronary artery disease) No date: CHF (congestive heart failure) ([place])     Comment:  a.) TTE 06/20/2017: EF 60-65%, no rwma, G1DD, no source               of cardiac emboli. No date: Chronic anticoagulation     Comment:  a.) apixaban No date: Chronic idiopathic constipation No date: Chronic mesenteric ischemia (HCC) No date: Chronic pain No date: COPD (chronic obstructive pulmonary disease) (HCC) No date: Dyspnea No date: Embolus of superior mesenteric artery ([place])     Comment:  a. 05/2017 CTA Abd/pelvis: apparent thrombus or embolus               in prox SMA (70-90%); b. 05/2017 catheter directed tPA,               mechanical thrombectomy, and stenting of the SMA. 01/19/2016: Essential hypertension with goal blood pressure less than  140/90 01/19/2016: Gastroesophageal reflux disease No date: GERD (gastroesophageal reflux disease) No date: H/O colectomy 11/14/2019: History of 2019 novel coronavirus disease (COVID-19) No date: History of kidney stones No date: Hyperlipidemia No date: Hypertension No date: Morbid obesity with BMI  of 40.0-44.9, adult ([place]) 06/27/2021: NSTEMI (non-ST elevated myocardial infarction) ([place])     Comment:  a.) high sensitivity troponins trended: 893 --> 1587 -->              1748 --> 868 --> 735 ng/L 06/19/2017: Occlusive mesenteric ischemia (HCC) No date: Osteoarthritis No date: PAD (peripheral artery disease) (HCC) No date: PAF (paroxysmal atrial fibrillation) (HCC)     Comment:  a.) CHA2DS2-VASc = 6 (sex, CHF, HTN, DVT x2, aortic               plaque). b.) rate/rhythm maintained on oral metoprolol               tartrate; chronically anticoagulated with standard  dose               apixaban. No date: Palpitations No date: Peripheral edema 06/01/2016: Personal history of other malignant neoplasm of skin No date: Pneumonia No date: Pre-diabetes 06/13/2017: Primary osteoarthritis of both knees 08/06/2017: SBO (small bowel obstruction) (HCC) No date: Skin cancer of nose No date: Superior mesenteric artery thrombosis (HCC) No date: Vitamin D deficiency  Past Surgical History: 04/06/2022: AMPUTATION; Right     Comment:  Procedure: AMPUTATION ABOVE KNEE;  Surgeon: Algernon Huxley, MD;  Location: [facility] ORS;  Service: Vascular;                Laterality: Right; 12/09/2019: AMPUTATION TOE; Left     Comment:  Procedure: AMPUTATION TOE MPJ left;  Surgeon: Caroline More, DPM;  Location: [facility] ORS;  Service: Podiatry;                Laterality: Left; No date: APPENDECTOMY 0/10/7791: APPLICATION OF WOUND VAC     Comment:  Procedure: APPLICATION OF WOUND VAC;  Surgeon: Algernon Huxley, MD;  Location: [facility] ORS;  Service: Vascular;; No date: CHOLECYSTECTOMY No date: COLON SURGERY 03/23/2022: COLONOSCOPY WITH PROPOFOL; N/A     Comment:  Procedure: COLONOSCOPY WITH PROPOFOL;  Surgeon: Jonathon Bellows, MD;  Location: [facility] ENDOSCOPY;  Service:               Gastroenterology;  Laterality: N/A;  rectal bleed 06/21/2020: EMBOLECTOMY OR THROMBECTOMY, WITH OR WITHOUT CATHETER;  FEMOROPOPLITEAL, AORTOILIAC ARTERY, BY LEG INCISION; N/A     Comment:  Left - Decomp Fasciotomy Leg; Ant &/Or Lat Compart Only;              Midline - Angiography, Visceral, Selective Or               Supraselective (With Or Without Flush Aortogram); Left -               Revascularize, Endovasc, Open/Percut, Iliac Artery,               Unilat, Initial Vessel; W/Translum Stent, W/Angioplasty;               Right - Revascularize, Endovasc, Open/Percut, Iliac               Artery, Ea Add`L Ipsilateral; W/Translumin Stent,               W/Angioplasty;  Location [facility]; 12/01/2021: ENDARTERECTOMY FEMORAL; Left  Comment:  Procedure: ENDARTERECTOMY FEMORAL;  Surgeon: Algernon Huxley, MD;  Location: [facility] ORS;  Service: Vascular;                Laterality: Left; 12/01/2021: ENDOVASCULAR REPAIR/STENT GRAFT; Left     Comment:  Procedure: ENDOVASCULAR REPAIR/STENT GRAFT;  Surgeon:               Algernon Huxley, MD;  Location: [place] [facility];                Service: Cardiovascular;  Laterality: Left; 08/08/2017: LAPAROTOMY; N/A     Comment:  Procedure: EXPLORATORY LAPAROTOMY POSSIBLE BOWEL               RESECTION;  Surgeon: Jules Husbands, MD;  Location: [facility]               ORS;  Service: General;  Laterality: N/A; 05/17/2021: LEFT HEART CATH AND CORONARY ANGIOGRAPHY; N/A     Comment:  Procedure: LEFT HEART CATH AND CORONARY ANGIOGRAPHY;                Surgeon: Yolonda Kida, MD;  Location: [place]              CV LAB;  Service: Cardiovascular;  Laterality: N/A; 11/17/2019: LOWER EXTREMITY ANGIOGRAPHY; Left     Comment:  Procedure: LOWER EXTREMITY ANGIOGRAPHY;  Surgeon: Algernon Huxley, MD;  Location: [place] CV [facility];  Service:               Cardiovascular;  Laterality: Left; 12/13/2020: LOWER EXTREMITY ANGIOGRAPHY; Right     Comment:  Procedure: LOWER EXTREMITY ANGIOGRAPHY;  Surgeon: Algernon Huxley, MD;  Location: [place] [facility];  Service:               Cardiovascular;  Laterality: Right; 01/10/2021: LOWER EXTREMITY ANGIOGRAPHY; Right     Comment:  Procedure: LOWER EXTREMITY ANGIOGRAPHY;  Surgeon: Algernon Huxley, MD;  Location: [place] [facility];  Service:               Cardiovascular;  Laterality: Right; 01/24/2021: LOWER EXTREMITY ANGIOGRAPHY; Right     Comment:  Procedure: LOWER EXTREMITY ANGIOGRAPHY;  Surgeon: Algernon Huxley, MD;  Location: [place] CV [facility];  Service:               Cardiovascular;  Laterality: Right; 06/28/2021: LOWER EXTREMITY  ANGIOGRAPHY; Bilateral     Comment:  Procedure: Lower Extremity Angiography;  Surgeon: Algernon Huxley, MD;  Location: [place] [facility];  Service:               Cardiovascular;  Laterality: Bilateral; 11/28/2021: LOWER EXTREMITY ANGIOGRAPHY; Right     Comment:  Procedure: LOWER EXTREMITY ANGIOGRAPHY;  Surgeon: Algernon Huxley, MD;  Location: [place] [facility];  Service:               Cardiovascular;  Laterality: Right; 11/30/2021: LOWER EXTREMITY ANGIOGRAPHY; Left     Comment:  Procedure: Lower Extremity Angiography;  Surgeon: Algernon Huxley, MD;  Location: [place] [facility];  Service:               Cardiovascular;  Laterality: Left; 02/22/2022: LOWER EXTREMITY ANGIOGRAPHY; Right     Comment:  Procedure: Lower Extremity Angiography;  Surgeon: Algernon Huxley, MD;  Location: [place] [facility];  Service:               Cardiovascular;  Laterality: Right; 03/13/2022: LOWER EXTREMITY ANGIOGRAPHY; Right     Comment:  Procedure: Lower Extremity Angiography;  Surgeon: Algernon Huxley, MD;  Location: [place] [facility];  Service:               Cardiovascular;  Laterality: Right; 03/24/2022: LOWER EXTREMITY ANGIOGRAPHY; Right     Comment:  Procedure: Lower Extremity Angiography;  Surgeon: Algernon Huxley, MD;  Location: [place] [facility];  Service:               Cardiovascular;  Laterality: Right; 11/19/2019: LOWER EXTREMITY INTERVENTION; N/A     Comment:  Procedure: LOWER EXTREMITY INTERVENTION;  Surgeon: Algernon Huxley, MD;  Location: [place] [facility];  Service:               Cardiovascular;  Laterality: N/A; 06/29/2021: LOWER EXTREMITY INTERVENTION; Bilateral     Comment:  Procedure: LOWER EXTREMITY INTERVENTION;  Surgeon: Algernon Huxley, MD;  Location: [facility] [facility];  Service:               Cardiovascular;  Laterality: Bilateral; 06/22/2017: TEE WITHOUT CARDIOVERSION; N/A      Comment:  Procedure: TRANSESOPHAGEAL ECHOCARDIOGRAM (TEE);                Surgeon: Wellington Hampshire, MD;  Location: [facility] ORS;                Service: Cardiovascular;  Laterality: N/A; 07/11/2018: TOTAL KNEE ARTHROPLASTY; Right     Comment:  Procedure: TOTAL KNEE ARTHROPLASTY;  Surgeon: Corky Mull, MD;  Location: [facility] ORS;  Service: Orthopedics;                Laterality: Right; No date: VAGINAL HYSTERECTOMY 06/20/2017: VISCERAL ARTERY INTERVENTION; N/A     Comment:  Procedure: Visceral Artery Intervention, possible aortic              thrombectomy;  Surgeon: Algernon Huxley, MD;  Location: [place] [facility] LAB;  Service: Cardiovascular;  Laterality:               N/A; 01/28/2018: VISCERAL ARTERY INTERVENTION; N/A     Comment:  Procedure: VISCERAL ARTERY INTERVENTION;  Surgeon: [name]  S, MD;  Location: [place] [facility] LAB;  Service:               Cardiovascular;  Laterality: N/A;  BMI    Body Mass Index: 40.24 kg/m    Patient with axillary-femoral bypass earlier today that now appears to have failed.  Patient's foot is mottled and she is in extreme pain, so we will proceed emergently to the OR for thrombectomy/revasularization.  Reproductive/Obstetrics negative OB ROS                              Anesthesia Physical Anesthesia Plan  ASA: 3  Anesthesia Plan: General   Post-op Pain Management: Ofirmev IV (intra-op)*   Induction: Intravenous  PONV Risk Score and Plan: 2 and Propofol infusion, TIVA and Ondansetron  Airway Management Planned: Nasal Cannula and Natural Airway  Additional Equipment: None  Intra-op Plan:   Post-operative Plan:   Informed Consent: I have reviewed the patients History and Physical, chart, labs and discussed the procedure including the risks, benefits and alternatives for the proposed anesthesia with the patient or authorized representative who has indicated his/her  understanding and acceptance.     Dental advisory given  Plan Discussed with: CRNA and Surgeon  Anesthesia Plan Comments: (Discussed risks of anesthesia with patient, including possibility of difficulty with spontaneous ventilation under anesthesia necessitating airway intervention, PONV, and rare risks such as cardiac or respiratory or neurological events, and allergic reactions. Discussed the role of CRNA in patient's perioperative care. Patient understands.)         Anesthesia Quick Evaluation

## 2022-08-28 NOTE — Progress Notes (Addendum)
[place] for heparin infusion initiation and monitoring Indication: hx of superior mesenteric artery thrombosis and A fib   Allergies  Allergen Reactions   Gabapentin     Tremors    Bactrim [Sulfamethoxazole-Trimethoprim] Itching    Patient Measurements: Height: '5\' 2"'$  (157.5 cm) Weight: 104.8 kg (231 lb 0.7 oz) IBW/kg (Calculated) : 50.1 Heparin Dosing Weight: 75 kg  Vital Signs: Temp: 98.1 F (36.7 C) (11/06 1556) Temp Source: Temporal (11/06 1131) BP: 104/91 (11/06 1556) Pulse Rate: 89 (11/06 1556)  Labs: Recent Labs    08/26/22 0810 08/26/22 1316 08/26/22 1316 08/26/22 1922 08/27/22 0533 08/28/22 0319 08/28/22 0849  HGB 10.7*  --   --   --  10.4* 10.6*  --   HCT 33.6*  --   --   --  33.0* 33.6*  --   PLT 232  --   --   --  238 230  --   APTT  --  >200*   < > 83* 85* 65*  --   LABPROT  --  16.2*  --   --   --   --   --   INR  --  1.3*  --   --   --   --   --   HEPARINUNFRC  --  >1.10*  --   --  >1.10* 1.10*  --   CREATININE 0.83  --   --   --  0.73  --  0.70   < > = values in this interval not displayed.     Estimated Creatinine Clearance: 82.9 mL/min (by C-G formula based on SCr of 0.7 mg/dL).   Medical History: Past Medical History:  Diagnosis Date   Abdominal aortic atherosclerosis ([place])    a. 05/2017 CTA abd/pelvis: significant atherosclerotic dzs of the infrarenal abd Ao w/ some mural thrombus. No aneurysm or dissection.   Acute focal ischemia of small intestine (HCC)    Acute right-sided low back pain with right-sided sciatica 06/13/2017   AKI (acute kidney injury) ([place]) 07/29/2017   Anemia    Anginal pain ([place])    a. 08/2012 Lexiscan MV: EF 54%, non ischemia/infarct.   Anxiety and depression    Baker's cyst of knee, right    a. 07/2016 U/S: 4.1 x 1.4 x 2.9 cystic structure in R poplitetal fossa.   Bell's palsy    CAD (coronary artery disease)    CHF (congestive heart failure) ([place])    a.) TTE 06/20/2017:  EF 60-65%, no rwma, G1DD, no source of cardiac emboli.   Chronic anticoagulation    a.) apixaban   Chronic idiopathic constipation    Chronic mesenteric ischemia (HCC)    Chronic pain    COPD (chronic obstructive pulmonary disease) (HCC)    Dyspnea    Embolus of superior mesenteric artery ([place])    a. 05/2017 CTA Abd/pelvis: apparent thrombus or embolus in prox SMA (70-90%); b. 05/2017 catheter directed tPA, mechanical thrombectomy, and stenting of the SMA.   Essential hypertension with goal blood pressure less than 140/90 01/19/2016   Gastroesophageal reflux disease 01/19/2016   GERD (gastroesophageal reflux disease)    H/O colectomy    History of 2019 novel coronavirus disease (COVID-19) 11/14/2019   History of kidney stones    Hyperlipidemia    Hypertension    Morbid obesity with BMI of 40.0-44.9, adult ([place])    NSTEMI (non-ST elevated myocardial infarction) ([place]) 06/27/2021   a.) high sensitivity troponins trended: 893 --> 1587 -->  1748 --> 868 --> 735 ng/L   Occlusive mesenteric ischemia (HCC) 06/19/2017   Osteoarthritis    PAD (peripheral artery disease) (HCC)    PAF (paroxysmal atrial fibrillation) (HCC)    a.) CHA2DS2-VASc = 6 (sex, CHF, HTN, DVT x2, aortic plaque). b.) rate/rhythm maintained on oral metoprolol tartrate; chronically anticoagulated with standard dose apixaban.   Palpitations    Peripheral edema    Personal history of other malignant neoplasm of skin 06/01/2016   Pneumonia    Pre-diabetes    Primary osteoarthritis of both knees 06/13/2017   SBO (small bowel obstruction) ([place]) 08/06/2017   Skin cancer of nose    Superior mesenteric artery thrombosis (HCC)    Vitamin D deficiency     Assessment: 61 y.o. female with medical history significant for peripheral arterial disease s/p right AKA, chronic diastolic dysfunction CHF, anxiety and depression, COPD, paroxysmal A-fib on chronic anticoagulation therapy, who presents to the ED with  increasing pain in her right  AKA stump. A review of medical records reveals apixaban therapy prior to arrival (unknown last dose)   Goal of Therapy: Heparin Dosing Weight: 75 kg Heparin level 0.3-0.7 units/ml aPTT 66 - 102 seconds Monitor platelets by anticoagulation protocol: Yes   Date/Time  aPTT/HL  Comment 11/04 1922  aPTT=83  Therapeutic x1  at 1400 un/hr 11/05 0533  85 / >1.1  aPTT therapeutic x 2 11/06 0319  65 / 1.1  aPTT, slightly subtherapeutic 11/06  Plan:  Last eliquis '5mg'$  dose given on 11/3 1700. HL persistently elevated. Will CTM dose by aPTT & check daily HL in AM.  Resumed heparin infusion at 1550 units/hr on 11/06 1530 Recheck aPTT in 6 hrs after resumption (11/06 2200) Recheck HL daily until correlation w/ aPTT confirmed Continue to monitor H&H and platelets  Lorna Dibble, PharmD, [facility] Clinical Pharmacist 08/28/2022 6:39 PM

## 2022-08-28 NOTE — Progress Notes (Signed)
[place] for heparin infusion initiation and monitoring Indication: hx of superior mesenteric artery thrombosis and A fib   Allergies  Allergen Reactions   Gabapentin     Tremors    Bactrim [Sulfamethoxazole-Trimethoprim] Itching    Patient Measurements: Height: '5\' 2"'$  (157.5 cm) Weight: 104.8 kg (231 lb 0.7 oz) IBW/kg (Calculated) : 50.1 Heparin Dosing Weight: 75 kg  Vital Signs: Temp: 98.6 F (37 C) (11/06 0514) BP: 111/50 (11/06 0514) Pulse Rate: 85 (11/06 0514)  Labs: Recent Labs    08/26/22 0810 08/26/22 1316 08/26/22 1316 08/26/22 1922 08/27/22 0533 08/28/22 0319  HGB 10.7*  --   --   --  10.4* 10.6*  HCT 33.6*  --   --   --  33.0* 33.6*  PLT 232  --   --   --  238 230  APTT  --  >200*   < > 83* 85* 65*  LABPROT  --  16.2*  --   --   --   --   INR  --  1.3*  --   --   --   --   HEPARINUNFRC  --  >1.10*  --   --  >1.10* 1.10*  CREATININE 0.83  --   --   --  0.73  --    < > = values in this interval not displayed.     Estimated Creatinine Clearance: 82.9 mL/min (by C-G formula based on SCr of 0.73 mg/dL).   Medical History: Past Medical History:  Diagnosis Date   Abdominal aortic atherosclerosis ([place])    a. 05/2017 CTA abd/pelvis: significant atherosclerotic dzs of the infrarenal abd Ao w/ some mural thrombus. No aneurysm or dissection.   Acute focal ischemia of small intestine (HCC)    Acute right-sided low back pain with right-sided sciatica 06/13/2017   AKI (acute kidney injury) ([place]) 07/29/2017   Anemia    Anginal pain ([place])    a. 08/2012 Lexiscan MV: EF 54%, non ischemia/infarct.   Anxiety and depression    Baker's cyst of knee, right    a. 07/2016 U/S: 4.1 x 1.4 x 2.9 cystic structure in R poplitetal fossa.   Bell's palsy    CAD (coronary artery disease)    CHF (congestive heart failure) ([place])    a.) TTE 06/20/2017: EF 60-65%, no rwma, G1DD, no source of cardiac emboli.   Chronic anticoagulation    a.)  apixaban   Chronic idiopathic constipation    Chronic mesenteric ischemia (HCC)    Chronic pain    COPD (chronic obstructive pulmonary disease) (HCC)    Dyspnea    Embolus of superior mesenteric artery ([place])    a. 05/2017 CTA Abd/pelvis: apparent thrombus or embolus in prox SMA (70-90%); b. 05/2017 catheter directed tPA, mechanical thrombectomy, and stenting of the SMA.   Essential hypertension with goal blood pressure less than 140/90 01/19/2016   Gastroesophageal reflux disease 01/19/2016   GERD (gastroesophageal reflux disease)    H/O colectomy    History of 2019 novel coronavirus disease (COVID-19) 11/14/2019   History of kidney stones    Hyperlipidemia    Hypertension    Morbid obesity with BMI of 40.0-44.9, adult ([place])    NSTEMI (non-ST elevated myocardial infarction) ([place]) 06/27/2021   a.) high sensitivity troponins trended: 893 --> 1587 --> 1748 --> 868 --> 735 ng/L   Occlusive mesenteric ischemia ([place]) 06/19/2017   Osteoarthritis    PAD (peripheral artery disease) (HCC)    PAF (paroxysmal  atrial fibrillation) ([place])    a.) CHA2DS2-VASc = 6 (sex, CHF, HTN, DVT x2, aortic plaque). b.) rate/rhythm maintained on oral metoprolol tartrate; chronically anticoagulated with standard dose apixaban.   Palpitations    Peripheral edema    Personal history of other malignant neoplasm of skin 06/01/2016   Pneumonia    Pre-diabetes    Primary osteoarthritis of both knees 06/13/2017   SBO (small bowel obstruction) ([place]) 08/06/2017   Skin cancer of nose    Superior mesenteric artery thrombosis (HCC)    Vitamin D deficiency     Assessment: 61 y.o. female with medical history significant for peripheral arterial disease s/p right AKA, chronic diastolic dysfunction CHF, anxiety and depression, COPD, paroxysmal A-fib on chronic anticoagulation therapy, who presents to the ED with  increasing pain in her right AKA stump. A review of medical records reveals apixaban therapy prior to arrival (unknown  last dose)   Goal of Therapy:  Heparin level 0.3-0.7 units/ml aPTT 66 - 102 seconds Monitor platelets by anticoagulation protocol: Yes   Date/Time  aPTT/HL  Comment 11/04'@1922'$   aPTT=83  Therapeutic x1  at 1400 un/hr 11/05 0533  85 / >1.1  aPTT therapeutic x 2 11/06 0319  65 / 1.1  aPTT, slightly subtherapeutic  Plan:  Increase hepatin infusion to 1550 units/hr Recheck aPTT in 6 hrs after rate change Recheck HL daily until correlation w/ aPTT confirmed Continue to monitor H&H and platelets  Renda Rolls, PharmD, [facility] 08/28/2022 5:54 AM

## 2022-08-29 ENCOUNTER — Encounter: Payer: Self-pay | Admitting: Vascular Surgery

## 2022-08-29 DIAGNOSIS — S7011XD Contusion of right thigh, subsequent encounter: Secondary | ICD-10-CM

## 2022-08-29 DIAGNOSIS — L02419 Cutaneous abscess of limb, unspecified: Secondary | ICD-10-CM

## 2022-08-29 DIAGNOSIS — Z89611 Acquired absence of right leg above knee: Secondary | ICD-10-CM

## 2022-08-29 LAB — CREATININE, SERUM
Creatinine, Ser: 0.63 mg/dL (ref 0.44–1.00)
GFR, Estimated: >60 mL/min (ref >60)

## 2022-08-29 LAB — CBC
HCT: 31.3 % — ABNORMAL LOW (ref 36.0–46.0)
Hemoglobin: 9.8 g/dL — ABNORMAL LOW (ref 12.0–15.0)
MCH: 28.4 pg (ref 26.0–34.0)
MCHC: 31.3 g/dL (ref 30.0–36.0)
MCV: 90.7 fL (ref 80.0–100.0)
Platelets: 220 10*3/uL (ref 150–400)
RBC: 3.45 MIL/uL — ABNORMAL LOW (ref 3.87–5.11)
RDW: 16.6 % — ABNORMAL HIGH (ref 11.5–15.5)
WBC: 5.8 10*3/uL (ref 4.0–10.5)
nRBC: 0 % (ref 0.0–0.2)

## 2022-08-29 LAB — APTT
aPTT: 134 seconds — ABNORMAL HIGH (ref 24–36)
aPTT: 133 seconds — ABNORMAL HIGH (ref 24–36)
aPTT: 83 seconds — ABNORMAL HIGH (ref 24–36)

## 2022-08-29 LAB — GLUCOSE, CAPILLARY: Glucose-Capillary: 183 mg/dL — ABNORMAL HIGH (ref 70–99)

## 2022-08-29 LAB — HEPARIN LEVEL (UNFRACTIONATED): Heparin Unfractionated: >1.1 IU/mL — ABNORMAL HIGH (ref 0.30–0.70)

## 2022-08-29 MED ORDER — APIXABAN 5 MG PO TABS
5.0000 mg | ORAL_TABLET | Freq: Two times a day (BID) | ORAL | Status: DC
  Administered 2022-08-29 – 2022-08-31 (×5): 5 mg via ORAL
  Filled 2022-08-29 (×5): qty 1

## 2022-08-29 MED ORDER — HEPARIN (PORCINE) 25000 UT/250ML-% IV SOLN: 1650.0000 [IU]/h | INTRAVENOUS | Status: DC

## 2022-08-29 NOTE — NC FL2 (Signed)
[place] LEVEL OF CARE SCREENING TOOL     IDENTIFICATION  Patient Name: Carla [name] Birthdate: 12-15-1960 Sex: female Admission Date (Current Location): 08/26/2022  [facility] and [place] Number:  Engineering geologist and Address:  [facility], [address] [place], [place] [address]      Provider Number: [id number]  Attending Physician Name and Address:  Emeterio Reeve, DO  Relative Name and Phone Number:  [name] Daughter [phone]    Current Level of Care:   Recommended Level of Care: [place] Prior Approval Number:    Date Approved/Denied:   PASRR Number: [id number]  Discharge Plan: SNF    Current Diagnoses: Patient Active Problem List   Diagnosis Date Noted   Pressure injury of skin 08/28/2022   Right leg pain 08/26/2022   HLD (hyperlipidemia) 08/26/2022   Iron deficiency anemia 08/26/2022   Acute blood loss anemia 08/09/2022   Hematoma 07/26/2022   Abscess of knee, right 07/24/2022   Acute respiratory failure with hypoxia ([place]) 05/02/2022   Lactic acidosis 05/02/2022   Hypoalbuminemia due to protein-calorie malnutrition ([place]) 05/02/2022   Atherosclerosis of artery of extremity with rest pain ([place]) 04/06/2022   Rectal bleeding 03/21/2022   Hypokalemia 02/03/2022   Chronic anticoagulation    Anxiety and depression    Palpitations    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[place]) 12/03/2021   Critical limb ischemia of left lower extremity ([place]) 11/30/2021   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   Arterial occlusion 11/11/2021   CAD (coronary artery disease) 07/07/2021   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[place]) 06/27/2021   Ischemic pain of right foot 06/27/2021   PAD (peripheral artery disease) ([place]) 05/03/2021   Tobacco abuse counseling 10/26/2020   Smoker 10/26/2020   Chronic diastolic CHF (congestive heart failure) ([place]) 10/06/2020   Chronic pain 12/01/2019   Primary osteoarthritis involving  multiple joints 12/01/2019   Carpal tunnel syndrome, right 11/10/2019   Health care maintenance 03/26/2019   Disorder of skeletal system 03/26/2019   Problems influencing health status 03/26/2019   Chronic low back pain (Secondary area of Pain) (Bilateral) w/o sciatica 03/26/2019   Aortic atherosclerosis ([place]) 09/12/2018   COPD (chronic obstructive pulmonary disease) ([place]) 09/12/2018   Moderate episode of recurrent major depressive disorder ([place]) 09/12/2018   Lumbar spondylosis 09/06/2018   Ventral hernia with obstruction and without gangrene 07/02/2018   PAF (paroxysmal atrial fibrillation) ([place]) 06/15/2018   Acute encephalopathy 04/08/2018   DDD (degenerative disc disease), lumbosacral 09/25/2017   Osteoarthritis of knee 09/25/2017   Adjustment disorder with anxiety 08/15/2017   Protein-calorie malnutrition, severe 08/08/2017   Chronic mesenteric ischemia (HCC)    AKI (acute kidney injury) ([place]) 07/29/2017   Hypotension 07/29/2017   Embolus of superior mesenteric artery ([place])    Abdominal pain    Occlusive mesenteric ischemia ([place]) 06/19/2017   Acute focal ischemia of small intestine (HCC)    Acute right-sided low back pain with right-sided sciatica 06/13/2017   Osteoarthritis of knee (Bilateral) 06/13/2017   Chronic pain syndrome 09/13/2016   Morbid obesity with BMI of 40.0-44.9, adult ([place]) 07/16/2016   Personal history of other malignant neoplasm of skin 06/01/2016   Chronic knee pain (Primary Area of Pain) (Bilateral) 01/19/2016   Essential hypertension 01/19/2016   GERD (gastroesophageal reflux disease) 01/19/2016   Mixed hyperlipidemia 01/19/2016   Prediabetes 01/19/2016   Recurrent major depressive disorder ([place]) 01/19/2016   Chronic pelvic pain in female 11/14/2013   Mixed stress and urge urinary incontinence 11/14/2013  Chest pain 08/06/2012    Orientation RESPIRATION BLADDER Height & Weight     Self, Time, Situation, Place  Normal (2 liters) Continent Weight:  109.4 kg Height:  '5\' 2"'$  (157.5 cm)  BEHAVIORAL SYMPTOMS/MOOD NEUROLOGICAL BOWEL NUTRITION STATUS      Continent Diet (see dc summary)  AMBULATORY STATUS COMMUNICATION OF NEEDS Skin   Extensive Assist Verbally Normal                       Personal Care Assistance Level of Assistance  Bathing, Feeding, Dressing Bathing Assistance: Maximum assistance Feeding assistance: Limited assistance Dressing Assistance: Maximum assistance     Functional Limitations Info  Sight, Hearing, Speech Sight Info: Adequate Hearing Info: Adequate Speech Info: Adequate    SPECIAL CARE FACTORS FREQUENCY  PT (By licensed PT), OT (By licensed OT)     PT Frequency: 5 times per week OT Frequency: 5 times per week            Contractures Contractures Info: Not present    Additional Factors Info  Code Status, Allergies Code Status Info: Full code Allergies Info: Bactrim Gabapentin           Current Medications (08/29/2022):  This is the current hospital active medication list Current Facility-Administered Medications  Medication Dose Route Frequency Provider Last Rate Last Admin   0.9 %  sodium chloride infusion   Intravenous PRN Algernon Huxley, MD   Stopped at 08/26/22 2254   acetaminophen (TYLENOL) tablet 650 mg  650 mg Oral Q6H PRN Algernon Huxley, MD       albuterol (PROVENTIL) (2.5 MG/3ML) 0.083% nebulizer solution 2.5 mg  2.5 mg Inhalation Q4H PRN Algernon Huxley, MD       alum & mag hydroxide-simeth (MAALOX/MYLANTA) 200-200-20 MG/5ML suspension 15-30 mL  15-30 mL Oral Q2H PRN Algernon Huxley, MD       aspirin EC tablet 81 mg  81 mg Oral Daily Algernon Huxley, MD   81 mg at 08/29/22 0835   atorvastatin (LIPITOR) tablet 80 mg  80 mg Oral QHS Algernon Huxley, MD   80 mg at 08/28/22 2143   cefTRIAXone (ROCEPHIN) 2 g in sodium chloride 0.9 % 100 mL IVPB  2 g Intravenous Q24H Algernon Huxley, MD 200 mL/hr at 08/28/22 1857 2 g at 08/28/22 1857   citalopram (CELEXA) tablet 40 mg  40 mg Oral Daily Algernon Huxley,  MD   40 mg at 08/29/22 0835   dextromethorphan-guaiFENesin ([place] DM) 30-600 MG per 12 hr tablet 1 tablet  1 tablet Oral BID PRN Algernon Huxley, MD       diphenhydrAMINE (BENADRYL) capsule 25 mg  25 mg Oral Q6H PRN Algernon Huxley, MD       docusate sodium (COLACE) capsule 100 mg  100 mg Oral Daily PRN Algernon Huxley, MD   100 mg at 08/27/22 0912   ferrous sulfate tablet 325 mg  325 mg Oral Daily Algernon Huxley, MD   325 mg at 08/29/22 0835   furosemide (LASIX) tablet 20 mg  20 mg Oral Daily Algernon Huxley, MD   20 mg at 08/29/22 0835   heparin ADULT infusion 100 units/mL (25000 units/249m)  1,650 Units/hr Intravenous Continuous CWynelle Cleveland RPH 16.5 mL/hr at 08/29/22 0930 1,650 Units/hr at 08/29/22 0930   hydrALAZINE (APRESOLINE) injection 5 mg  5 mg Intravenous Q2H PRN DAlgernon Huxley MD       HYDROcodone-acetaminophen (NORCO/VICODIN)  5-325 MG per tablet 1-2 tablet  1-2 tablet Oral Q4H PRN Algernon Huxley, MD   2 tablet at 08/29/22 0948   Ipratropium-Albuterol (COMBIVENT) respimat 1 puff  1 puff Inhalation Q6H Algernon Huxley, MD   1 puff at 08/29/22 0950   isosorbide mononitrate (IMDUR) 24 hr tablet 60 mg  60 mg Oral Daily Algernon Huxley, MD   60 mg at 08/29/22 0834   lidocaine (LIDODERM) 5 % 1 patch  1 patch Transdermal Q24H Algernon Huxley, MD   1 patch at 08/28/22 1739   metolazone (ZAROXOLYN) tablet 2.5 mg  2.5 mg Oral Daily Algernon Huxley, MD   2.5 mg at 08/29/22 0953   metoprolol tartrate (LOPRESSOR) tablet 12.5 mg  12.5 mg Oral BID Algernon Huxley, MD   12.5 mg at 08/29/22 0835   morphine (MS CONTIN) 12 hr tablet 15 mg  15 mg Oral Q12H Algernon Huxley, MD   15 mg at 08/29/22 0835   morphine (PF) 2 MG/ML injection 2 mg  2 mg Intravenous Q4H PRN Algernon Huxley, MD   2 mg at 08/29/22 1253   multivitamin with minerals tablet 1 tablet  1 tablet Oral Daily Algernon Huxley, MD   1 tablet at 08/29/22 0835   ondansetron (ZOFRAN) injection 4 mg  4 mg Intravenous Q8H PRN Algernon Huxley, MD       pantoprazole (PROTONIX) EC  tablet 40 mg  40 mg Oral Daily Algernon Huxley, MD   40 mg at 08/29/22 0835   polyethylene glycol (MIRALAX / GLYCOLAX) packet 17 g  17 g Oral Daily Algernon Huxley, MD   17 g at 08/29/22 0834   pregabalin (LYRICA) capsule 200 mg  200 mg Oral TID Algernon Huxley, MD   200 mg at 08/29/22 0834   senna-docusate (Senokot-S) tablet 1 tablet  1 tablet Oral QHS Algernon Huxley, MD   1 tablet at 08/28/22 2143   vancomycin (VANCOREADY) IVPB 1500 mg/300 mL  1,500 mg Intravenous Q24H Algernon Huxley, MD 150 mL/hr at 08/29/22 1214 1,500 mg at 08/29/22 1214     Discharge Medications: Please see discharge summary for a list of discharge medications.  Relevant Imaging Results:  Relevant Lab Results:   Additional Information SS#- [id number]  Conception Oms, RN

## 2022-08-29 NOTE — Consult Note (Addendum)
Pharmacy Antibiotic Note  Carla [name] is a 61 y.o. female  w/ h/o peripheral arterial disease s/p right AKA, chronic diastolic dysfunction CHF, anxiety and depression, COPD, paroxysmal A-fib on chronic anticoagulation therapy, who presents to the ED with increasing pain in her right AKA stump with abscess. Pharmacy has been consulted for vancomycin dosing.  On day 4 of antibiotics. 11/6 received I&D  Plan: Continue vancomycin IV 1500 mg every 24 hours Goal AUC 400-600  Est AUC: 547.7;  Cmin: 12.4 SCr 0.8; TBW, Vd 0.5  Also on ceftriaxone IV 2 grams every 24 hours  Follow up renal function daily and wound culture. Levels at steady state.  Height: '5\' 2"'$  (157.5 cm) Weight: 109.4 kg (241 lb 2.9 oz) IBW/kg (Calculated) : 50.1  Temp (24hrs), Avg:97.7 F (36.5 C), Min:97 F (36.1 C), Max:98.3 F (36.8 C)  Recent Labs  Lab 08/26/22 0810 08/27/22 0533 08/28/22 0319 08/28/22 0849 08/29/22 0525  WBC 7.2 5.9 7.0  --  5.8  CREATININE 0.83 0.73  --  0.70 0.63     Estimated Creatinine Clearance: 84.9 mL/min (by C-G formula based on SCr of 0.63 mg/dL).    Allergies  Allergen Reactions   Gabapentin     Tremors    Bactrim [Sulfamethoxazole-Trimethoprim] Itching    Antimicrobials this admission: Zosyn 11/4 >> 11/4 Vancomycin 11/4 >> Ceftriaxone 11/4 >>  Dose adjustments this admission: 11/4 Vancomycin 1,000 mg every 12 hours >> 11/6 1500 mg every 24 hours  Microbiology results: 11/6 BCx: NG x 1 d 11/6 Surgical/Deep wound body fluid: NG x 12 hrs  Thank you for allowing pharmacy to be a part of this patient's care.  Wynelle Cleveland 08/29/2022 8:01 AM

## 2022-08-29 NOTE — Plan of Care (Signed)

## 2022-08-29 NOTE — Plan of Care (Signed)
  Problem: Education: Goal: Knowledge of General Education information will improve Description: Including pain rating scale, medication(s)/side effects and non-pharmacologic comfort measures Outcome: Progressing   Problem: Clinical Measurements: Goal: Respiratory complications will improve Outcome: Progressing   Problem: Nutrition: Goal: Adequate nutrition will be maintained Outcome: Progressing   Problem: Coping: Goal: Level of anxiety will decrease Outcome: Progressing   Problem: Elimination: Goal: Will not experience complications related to bowel motility Outcome: Progressing Goal: Will not experience complications related to urinary retention Outcome: Progressing

## 2022-08-29 NOTE — Progress Notes (Signed)
PROGRESS NOTE    Carla BERNTSEN   PXT:[id number] DOB: 28-Oct-1960  DOA: 08/26/2022 Date of Service: 08/29/22 PCP: [facility]     Brief Narrative / Hospital Course:  Carla [name] is a 61 y.o. female with medical history significant of PAD status post right AKA and left axillary femoral bypass graft, diastolic CHF, COPD, and superior mesenteric artery thrombus and atrial fibrillation on Eliquis, hypertension, hyperlipidemia, depression, CAD, obesity, chronic pain, anemia, right knee abscess at AKA site, who present with increasing pain in right AKA stump. Pt had abscess of right knee at AKA site recently. She completed a course of Abx. Now comes back with worsening pain in that site in the past several days. Patient also has erythema at right AKA stump site 11/04: in ED - WBC 7.2, potassium 3.1, GFR> 60, temperature normal, blood pressure 113/67, heart rate 83, RR 24, oxygen saturation 93% on room air.  Negative COVID PCR.  X-ray of left femur is negative for bony fracture. CT R femur (+) "Complex mixed attenuation fluid collection surrounding the distal femoral resection margin has increased in size from prior... nonspecific and could represent an adventitial bursa, which could be sterile or infected. Abscess is also a concern, although there is relatively little surrounding inflammatory changes." Patient is admitted to telemetry bed as inpatient. VVS, Dr. Trula Slade is consulted.  11/05: no concerns on labs/VS. Vascular surgery planning I&D tomorrow  11/06: underwent I&D with evacuation of hematoma right thigh, concern for infection, cultures collected.  11/07: BCx NG x1d. Wound Cx fluid from R AKA stump no WBC/organisms seen.   Consultants:  Vein/Vascular Surgery   Procedures: 11/06: I&D with evacuation of hematoma right thigh - Dr Lucky Cowboy      ASSESSMENT & PLAN:   Principal Problem:   Abscess of knee, right Active Problems:   Chronic mesenteric ischemia (HCC)   PAD  (peripheral artery disease) (HCC)   CAD (coronary artery disease)   Chronic diastolic CHF (congestive heart failure) (HCC)   PAF (paroxysmal atrial fibrillation) (HCC)   COPD (chronic obstructive pulmonary disease) (HCC)   HLD (hyperlipidemia)   Recurrent major depressive disorder (HCC)   Chronic pain   Hypokalemia   Iron deficiency anemia   Morbid obesity with BMI of 40.0-44.9, adult ([place])  S/p R BKA Pain in stump, fluid collection on CT concern for possible abscess of knee, right Does NOT meet criteria for sepsis.   vascular surgery --> s/p I&D 11/06 Cultures collected intra-op 11/06 --> no WBC/organisms seen Vancomycin and Rocephin (patient received 1 dose of Zosyn in ED) blood cultures pending --> NGx1d pain control: Continue home MS Contin and Norco, start as needed morphine to wean IV pain meds as able    Chronic mesenteric ischemia switched Eliquis to IV heparin pre-op, will restart Eliquis    PAD (peripheral artery disease) [facility]):  s/p of left axillary femoral bypass graft, Aspirin, Lipitor   CAD (coronary artery disease):  no CP Aspirin and Lipitor, imdur   Chronic diastolic CHF (congestive heart failure) ([place]):  2D echo on 06/15/2018 showed EF of 60 to 65%.   No signs of CHF exacerbation. Continue home Lasix 20 mg daily and metolazone 2.5 mg daily   PAF (paroxysmal atrial fibrillation) (HCC) Switch Eliquis to IV heparin as above periop, back to eliquis today  Metoprolol   COPD (chronic obstructive pulmonary disease) ([place]): Stable Bronchodilators   HLD (hyperlipidemia) Lipitor   Recurrent major depressive disorder ([place]) Continue home medications   Chronic  pain: Continue home MS Contin and Norco   Hypokalemia - resolved Potassium 3.1 on admission Repleted potassium Check a magnesium level --> WNL Follow BMP   Iron deficiency anemia:  Hemoglobin stable.  10.7 (8.1 on 08/09/2022) Continue home iron supplement   Morbid obesity with BMI of 40.0-44.9,  adult [facility]):   BMI= 41.37  and BW= 102.6 Kg Diet and exercise.   Encouraged to lose weight.    DVT prophylaxis: heparin (pending surgical intervention) --> restart Eliquis likely today Pertinent IV fluids/nutrition: no conitnuous IV fluids Central lines / invasive devices: none  Code Status: FULL CODE Family Communication: none at this time   Disposition: anticipate d/c back to previous SNF TOC needs: confirm SNF on eventual discharge  Barriers to discharge / significant pending items: on IV abx pending BCx and hematoma Cx from surgery but these have been negative thus far, anticipate d/c today/tomorrow              Subjective:  Patient resting comfortably today, no concerns from RN.        Objective:  Vitals:   08/29/22 0013 08/29/22 0345 08/29/22 0500 08/29/22 0735  BP: (!) 134/99 126/65  (!) 112/54  Pulse: 96 87  74  Resp: '18 18  16  '$ Temp:    98 F (36.7 C)  TempSrc:      SpO2: 99% 94%  97%  Weight:   109.4 kg   Height:        Intake/Output Summary (Last 24 hours) at 08/29/2022 0851 Last data filed at 08/29/2022 0300 Gross per 24 hour  Intake 640 ml  Output 1600 ml  Net -960 ml    Filed Weights   08/28/22 0500 08/28/22 1131 08/29/22 0500  Weight: 104.8 kg 104.8 kg 109.4 kg    Examination:  Constitutional:  VS as above General Appearance: obese, NAD Respiratory: Normal respiratory effort No wheeze Cardiovascular: S1/S2 normal        Scheduled Medications:   aspirin EC  81 mg Oral Daily   atorvastatin  80 mg Oral QHS   citalopram  40 mg Oral Daily   ferrous sulfate  325 mg Oral Daily   furosemide  20 mg Oral Daily   Ipratropium-Albuterol  1 puff Inhalation Q6H   isosorbide mononitrate  60 mg Oral Daily   lidocaine  1 patch Transdermal Q24H   metolazone  2.5 mg Oral Daily   metoprolol tartrate  12.5 mg Oral BID   morphine  15 mg Oral Q12H   multivitamin with minerals  1 tablet Oral Daily   pantoprazole  40 mg Oral Daily    polyethylene glycol  17 g Oral Daily   pregabalin  200 mg Oral TID   senna-docusate  1 tablet Oral QHS    Continuous Infusions:  sodium chloride Stopped (08/26/22 2254)   cefTRIAXone (ROCEPHIN)  IV 2 g (08/28/22 1857)   heparin     vancomycin      PRN Medications:  sodium chloride, acetaminophen, albuterol, alum & mag hydroxide-simeth, dextromethorphan-guaiFENesin, diphenhydrAMINE, docusate sodium, hydrALAZINE, HYDROcodone-acetaminophen, morphine injection, ondansetron (ZOFRAN) IV  Antimicrobials:  Anti-infectives (From admission, onward)    Start     Dose/Rate Route Frequency Ordered Stop   08/28/22 1200  vancomycin (VANCOREADY) IVPB 1500 mg/300 mL        1,500 mg 150 mL/hr over 120 Minutes Intravenous Every 24 hours 08/28/22 1104     08/27/22 1215  vancomycin (VANCOCIN) IVPB 1000 mg/200 mL premix  Status:  Discontinued  1,000 mg 200 mL/hr over 60 Minutes Intravenous Every 12 hours 08/27/22 1126 08/28/22 1104   08/26/22 1930  vancomycin (VANCOCIN) IVPB 1000 mg/200 mL premix        1,000 mg 200 mL/hr over 60 Minutes Intravenous  Once 08/26/22 1836 08/26/22 2216   08/26/22 1900  cefTRIAXone (ROCEPHIN) 2 g in sodium chloride 0.9 % 100 mL IVPB        2 g 200 mL/hr over 30 Minutes Intravenous Every 24 hours 08/26/22 1830     08/26/22 1045  vancomycin (VANCOCIN) IVPB 1000 mg/200 mL premix        1,000 mg 200 mL/hr over 60 Minutes Intravenous  Once 08/26/22 1035 08/26/22 1211   08/26/22 1045  piperacillin-tazobactam (ZOSYN) IVPB 3.375 g        3.375 g 100 mL/hr over 30 Minutes Intravenous  Once 08/26/22 1035 08/26/22 1211       Data Reviewed: I have personally reviewed following labs and imaging studies  CBC: Recent Labs  Lab 08/26/22 0810 08/27/22 0533 08/28/22 0319 08/29/22 0525  WBC 7.2 5.9 7.0 5.8  NEUTROABS 4.0  --   --   --   HGB 10.7* 10.4* 10.6* 9.8*  HCT 33.6* 33.0* 33.6* 31.3*  MCV 91.3 91.9 91.6 90.7  PLT 232 238 230 614    Basic Metabolic  Panel: Recent Labs  Lab 08/26/22 0810 08/26/22 1303 08/27/22 0533 08/28/22 0849 08/29/22 0525  NA 136  --  139 139  --   K 3.1*  --  3.7 3.7  --   CL 95*  --  100 99  --   CO2 31  --  26 30  --   GLUCOSE 117*  --  127* 109*  --   BUN 19  --  18 21  --   CREATININE 0.83  --  0.73 0.70 0.63  CALCIUM 9.1  --  9.2 9.1  --   MG  --  2.0  --   --   --     GFR: Estimated Creatinine Clearance: 84.9 mL/min (by C-G formula based on SCr of 0.63 mg/dL). Liver Function Tests: No results for input(s): "AST", "ALT", "ALKPHOS", "BILITOT", "PROT", "ALBUMIN" in the last 168 hours. No results for input(s): "LIPASE", "AMYLASE" in the last 168 hours. No results for input(s): "AMMONIA" in the last 168 hours. Coagulation Profile: Recent Labs  Lab 08/26/22 1316  INR 1.3*    Cardiac Enzymes: No results for input(s): "CKTOTAL", "CKMB", "CKMBINDEX", "TROPONINI" in the last 168 hours. BNP (last 3 results) No results for input(s): "PROBNP" in the last 8760 hours. HbA1C: No results for input(s): "HGBA1C" in the last 72 hours. CBG: Recent Labs  Lab 08/27/22 0726 08/28/22 0730 08/28/22 1355 08/29/22 0739  GLUCAP 105* 125* 126* 183*    Lipid Profile: No results for input(s): "CHOL", "HDL", "LDLCALC", "TRIG", "CHOLHDL", "LDLDIRECT" in the last 72 hours. Thyroid Function Tests: No results for input(s): "TSH", "T4TOTAL", "FREET4", "T3FREE", "THYROIDAB" in the last 72 hours. Anemia Panel: No results for input(s): "VITAMINB12", "FOLATE", "FERRITIN", "TIBC", "IRON", "RETICCTPCT" in the last 72 hours. Urine analysis:    Component Value Date/Time   COLORURINE AMBER (A) 03/22/2022 1520   APPEARANCEUR CLOUDY (A) 03/22/2022 1520   APPEARANCEUR Clear 01/19/2015 1112   LABSPEC 1.016 03/22/2022 1520   LABSPEC 1.019 01/19/2015 1112   PHURINE 6.0 03/22/2022 1520   GLUCOSEU NEGATIVE 03/22/2022 1520   GLUCOSEU Negative 01/19/2015 1112   HGBUR LARGE (A) 03/22/2022 1520   BILIRUBINUR NEGATIVE 03/22/2022  [place] Negative 01/19/2015 1112   [place] 03/22/2022 1520   PROTEINUR 30 (A) 03/22/2022 1520   NITRITE POSITIVE (A) 03/22/2022 1520   LEUKOCYTESUR LARGE (A) 03/22/2022 1520   LEUKOCYTESUR Negative 01/19/2015 1112   Sepsis Labs: '@LABRCNTIP'$ (procalcitonin:4,lacticidven:4)  Recent Results (from the past 240 hour(s))  SARS Coronavirus 2 by RT PCR (hospital order, performed in [facility] hospital lab) *cepheid single result test* Anterior Nasal Swab     Status: None   Collection Time: 08/26/22  1:03 PM   Specimen: Anterior Nasal Swab  Result Value Ref Range Status   SARS Coronavirus 2 by RT PCR NEGATIVE NEGATIVE Final    Comment: (NOTE) SARS-CoV-2 target nucleic acids are NOT DETECTED.  The SARS-CoV-2 RNA is generally detectable in upper and lower respiratory specimens during the acute phase of infection. The lowest concentration of SARS-CoV-2 viral copies this assay can detect is 250 copies / mL. A negative result does not preclude SARS-CoV-2 infection and should not be used as the sole basis for treatment or other patient management decisions.  A negative result may occur with improper specimen collection / handling, submission of specimen other than nasopharyngeal swab, presence of viral mutation(s) within the areas targeted by this assay, and inadequate number of viral copies (<250 copies / mL). A negative result must be combined with clinical observations, patient history, and epidemiological information.  Fact Sheet for Patients:   https://www.patel.info/  Fact Sheet for Healthcare Providers: https://hall.com/  This test is not yet approved or  cleared by the [place] FDA and has been authorized for detection and/or diagnosis of SARS-CoV-2 by FDA under an Emergency Use Authorization (EUA).  This EUA will remain in effect (meaning this test can be used) for the duration of the COVID-19 declaration under  Section 564(b)(1) of the Act, 21 U.S.C. section 360bbb-3(b)(1), unless the authorization is terminated or revoked sooner.  Performed at [facility], [address], [place], [place] [address]   Surgical pcr screen     Status: None   Collection Time: 08/26/22  5:33 PM   Specimen: Nasal Mucosa; Nasal Swab  Result Value Ref Range Status   MRSA, PCR NEGATIVE NEGATIVE Final   Staphylococcus aureus NEGATIVE NEGATIVE Final    Comment: (NOTE) The Xpert SA Assay (FDA approved for NASAL specimens in patients 29 years of age and older), is one component of a comprehensive surveillance program. It is not intended to diagnose infection nor to guide or monitor treatment. Performed at [facility], [address] [place], [place] [address]   Culture, blood (Routine X 2) w Reflex to ID Panel     Status: None (Preliminary result)   Collection Time: 08/28/22  3:19 AM   Specimen: BLOOD LEFT ARM  Result Value Ref Range Status   Specimen Description BLOOD LEFT ARM  Final   Special Requests   Final    BOTTLES DRAWN AEROBIC AND ANAEROBIC Blood Culture adequate volume   Culture   Final    NO GROWTH 1 DAY Performed at [facility], [address] [place], [place] [address]    Report Status PENDING  Incomplete  Culture, blood (Routine X 2) w Reflex to ID Panel     Status: None (Preliminary result)   Collection Time: 08/28/22  3:28 AM   Specimen: BLOOD LEFT HAND  Result Value Ref Range Status   Specimen Description BLOOD LEFT HAND  Final   Special Requests   Final    BOTTLES DRAWN AEROBIC  AND ANAEROBIC Blood Culture adequate volume   Culture   Final    NO GROWTH 1 DAY Performed at [facility], [address] [place], [place] [address]    Report Status PENDING  Incomplete  Aerobic/Anaerobic Culture w Gram Stain (surgical/deep wound)     Status: None (Preliminary result)   Collection Time: 08/28/22  1:18 PM   Specimen: Wound; Body Fluid  Result Value  Ref Range Status   Specimen Description   Final    WOUND Performed at [facility], [address] [place], [place] [address]    Special Requests FLUID FROM RIGHT AKA STUMP  Final   Gram Stain   Final    NO WBC SEEN NO ORGANISMS SEEN Performed at [facility], [place] [address] [place], [place] [address]    Culture PENDING  Incomplete   Report Status PENDING  Incomplete         Radiology Studies: CT FEMUR RIGHT W CONTRAST  Result Date: 08/26/2022 CLINICAL DATA:  Right leg pain.  History of above knee amputation EXAM: CT OF THE LOWER RIGHT EXTREMITY WITH CONTRAST TECHNIQUE: Multidetector CT imaging of the lower right extremity was performed according to the standard protocol following intravenous contrast administration. RADIATION DOSE REDUCTION: This exam was performed according to the departmental dose-optimization program which includes automated exposure control, adjustment of the mA and/or kV according to patient size and/or use of iterative reconstruction technique. CONTRAST:  13m OMNIPAQUE IOHEXOL 300 MG/ML  SOLN COMPARISON:  X-ray 08/26/2022, CT 07/30/2022 FINDINGS: Bones/Joint/Cartilage Postsurgical changes of above knee amputation of the right leg. Stippled appearance of the anterior greater than posterior cortex of the distal femoral diaphysis is unchanged in appearance compared to the prior CT and may be related to demineralization (series 4, image 309). Erosive/destructive changes is felt to be less likely given the lack of interval change. Amputation margin remains sharp. Proximal femur is intact without fracture or dislocation. The visualized pelvis is intact without fracture or diastasis. No lytic or sclerotic bone lesion. Ligaments Suboptimally assessed by CT. Muscles and Tendons Amputation changes of the distal thigh musculotendinous structures with associated muscle atrophy. Soft tissues Complex mixed attenuation fluid collection surrounding the distal femoral  resection margin has increased in size from prior, now measuring 7.0 x 6.0 x 6.7 cm (series 3, image 323), previously measured 4.5 x 2.7 x 2.3 cm. Collection demonstrates thick peripheral enhancement. No internal soft tissue gas. Relative lack of stranding/inflammatory changes within the adjacent subcutaneous fat. No right inguinal lymphadenopathy. Large ventral abdominal wall hernia containing non compromised appearing loops of large and small bowel. Partially imaged aortoiliac stent graft. IMPRESSION: 1. Postsurgical changes of above knee amputation of the right leg. 2. Complex mixed attenuation fluid collection surrounding the distal femoral resection margin has increased in size from prior, now measuring 7.0 x 6.0 x 6.7 cm, previously measured 4.5 x 2.7 x 2.3 cm. Appearance remains nonspecific and could represent an adventitial bursa, which could be sterile or infected. Abscess is also a concern, although there is relatively little surrounding inflammatory changes 3. Stippled appearance of the distal femoral diaphysis is unchanged in appearance compared to the prior CT and may be related to disuse demineralization. Erosive/destructive changes is felt to be less likely given the lack of interval change. 4. Large ventral abdominal wall hernia containing multiple non-compromised appearing loops of large and small bowel. Electronically Signed   By: NDavina PokeD.O.   On: 08/26/2022 10:08   DG Femur Min  2 Views Right  Result Date: 08/26/2022 CLINICAL DATA:  RIGHT leg/stump pain.  History of AKA in June 2023. EXAM: RIGHT FEMUR 2 VIEWS COMPARISON:  07/30/2022 and prior studies FINDINGS: AKA changes again noted. There is no evidence of acute fracture, subluxation, dislocation or definite radiographic evidence of osteomyelitis. No definite gas in the soft tissues noted. No unexpected radiopaque foreign bodies noted. IMPRESSION: AKA changes without acute abnormality. If there is strong clinical suspicion for  acute osteomyelitis, recommend MRI. Electronically Signed   By: Margarette Canada M.D.   On: 08/26/2022 08:47            LOS: 3 days      Emeterio Reeve, DO [facility] 08/29/2022, 8:51 AM   Staff may message me via secure chat in [place]  but this may not receive immediate response,  please page for urgent matters!  If 7PM-7AM, please contact night-coverage [email or web address]  Dictation software was used to generate the above note. Typos may occur and escape review, as with typed/written notes. Please contact Dr Sheppard Coil directly for clarity if needed.

## 2022-08-29 NOTE — Discharge Summary (Addendum)
Physician Discharge Summary   Patient: Carla [name] MRN: [id number]  DOB: Feb 03, 1961   Admit:     Date of Admission: 08/26/2022 Admitted from: SNF    Discharge: Date of discharge: 08/29/22 Disposition: Skilled nursing facility Condition at discharge: fair  CODE STATUS: FULL CODE      Discharge Physician: Emeterio Reeve, DO [facility]     PCP: [facility], [facility]  Recommendations for Outpatient Follow-up:  Follow up with PCP Center, [facility] in 2-4 weeks Please obtain labs/tests: BMP, CBC in 2-3 days at SNF  Please follow up on the following pending results: final blood and wound cultures but these have no growth thus far  PCP AND OTHER OUTPATIENT PROVIDERS: SEE BELOW FOR SPECIFIC DISCHARGE INSTRUCTIONS PRINTED FOR PATIENT IN ADDITION TO GENERIC AVS PATIENT INFO        Discharge Diagnoses: Principal Problem:   Hematoma of right thigh Active Problems:   Chronic mesenteric ischemia (HCC)   PAD (peripheral artery disease) (HCC)   CAD (coronary artery disease)   Chronic diastolic CHF (congestive heart failure) (HCC)   PAF (paroxysmal atrial fibrillation) (HCC)   COPD (chronic obstructive pulmonary disease) ([place])   HLD (hyperlipidemia)   Recurrent major depressive disorder (HCC)   Chronic pain   Hypokalemia   Iron deficiency anemia   Morbid obesity with BMI of 40.0-44.9, adult ([place])   Pressure injury of skin   Status post above-knee amputation of right lower extremity [facility])       Hospital Course: Carla [name] is a 61 y.o. female with medical history significant of PAD status post right AKA and left axillary femoral bypass graft, diastolic CHF, COPD, and superior mesenteric artery thrombus and atrial fibrillation on Eliquis, hypertension, hyperlipidemia, depression, CAD, obesity, chronic pain, anemia, right knee abscess at AKA site, who present with increasing pain in right AKA stump. Pt had abscess of right knee at AKA site  recently. She completed a course of Abx. Now comes back with worsening pain in that site in the past several days. Patient also has erythema at right AKA stump site 11/04: in ED - WBC 7.2, potassium 3.1, GFR> 60, temperature normal, blood pressure 113/67, heart rate 83, RR 24, oxygen saturation 93% on room air.  Negative COVID PCR.  X-ray of left femur is negative for bony fracture. CT R femur (+) "Complex mixed attenuation fluid collection surrounding the distal femoral resection margin has increased in size from prior... nonspecific and could represent an adventitial bursa, which could be sterile or infected. Abscess is also a concern, although there is relatively little surrounding inflammatory changes." Patient is admitted to telemetry bed as inpatient. VVS, Dr. Trula Slade is consulted.  11/05: no concerns on labs/VS. Vascular surgery planning I&D tomorrow  11/06: underwent I&D with evacuation of hematoma right thigh, confirmed w/ Dr Lucky Cowboy there was NO concern for infection, cultures collected.  11/07: BCx NG x1d. Wound Cx fluid from R AKA stump no WBC/organisms seen. D/c abx. BARRIER TO DISCHARGE: insurance auth for SNF pending, likely will need to d/c tomorrow   Consultants:  Vein/Vascular Surgery   Procedures: 11/06: I&D with evacuation of hematoma right thigh - Dr Lucky Cowboy      ASSESSMENT & PLAN:   Principal Problem:   Abscess of knee, right Active Problems:   Chronic mesenteric ischemia (HCC)   PAD (peripheral artery disease) (HCC)   CAD (coronary artery disease)   Chronic diastolic CHF (congestive heart failure) (HCC)   PAF (paroxysmal atrial  fibrillation) (HCC)   COPD (chronic obstructive pulmonary disease) (HCC)   HLD (hyperlipidemia)   Recurrent major depressive disorder (HCC)   Chronic pain   Hypokalemia   Iron deficiency anemia   Morbid obesity with BMI of 40.0-44.9, adult ([place])  S/p R BKA Pain in stump, fluid collection on CT concern for possible abscess of knee, right.  Intraoperative findings c/w hematoma NOT abscess  Does NOT meet criteria for sepsis.   vascular surgery --> s/p I&D 11/06 Cultures collected intra-op 11/06 --> no WBC/organisms seen Vancomycin and Rocephin (patient received 1 dose of Zosyn in ED) --> d/c abx  blood cultures --> NGx1d pain control: Continue home MS Contin and Norco wean IV pain meds as able    Chronic mesenteric ischemia switched Eliquis to IV heparin pre-op, will restart Eliquis    PAD (peripheral artery disease) [facility]):  s/p of left axillary femoral bypass graft, Aspirin, Lipitor   CAD (coronary artery disease):  no CP Aspirin and Lipitor, imdur   Chronic diastolic CHF (congestive heart failure) ([place]):  2D echo on 06/15/2018 showed EF of 60 to 65%.   No signs of CHF exacerbation. Continue home Lasix 20 mg daily and metolazone 2.5 mg daily   PAF (paroxysmal atrial fibrillation) (HCC) Switched Eliquis to IV heparin as above periop, back to eliquis today  Metoprolol   COPD (chronic obstructive pulmonary disease) ([place]): Stable Bronchodilators   HLD (hyperlipidemia) Lipitor   Recurrent major depressive disorder ([place]) Continue home medications   Chronic pain: Continue home MS Contin and Norco   Hypokalemia - resolved Potassium 3.1 on admission Repleted potassium Check a magnesium level --> WNL Follow BMP   Iron deficiency anemia:  Hemoglobin stable.  10.7 (8.1 on 08/09/2022) Continue home iron supplement   Morbid obesity with BMI of 40.0-44.9, adult [facility]):   BMI= 41.37  and BW= 102.6 Kg Diet and exercise.   Encouraged to lose weight.           Discharge Instructions     Contact information for after-discharge care     Destination     [facility] .   Service: Skilled Nursing Contact information: [address] [place] [phone]                     Allergies  Allergen Reactions   Gabapentin     Tremors     Bactrim [Sulfamethoxazole-Trimethoprim] Itching     Subjective: Pt reports no complaints this morning, pain is controlled except when anyone touches the leg. Wound vac is in place.    Discharge Exam: BP 105/61 (BP Location: Right Arm)   Pulse 73   Temp 98 F (36.7 C)   Resp 16   Ht '5\' 2"'$  (1.575 m)   Wt 109.4 kg   SpO2 95%   BMI 44.11 kg/m  General: Pt is alert, awake, not in acute distress Cardiovascular: RRR, S1/S2 +, no rubs, no gallops Respiratory: CTA bilaterally, no wheezing, no rhonchi Abdominal: Soft, NT, ND, bowel sounds + Extremities: R AKA stump w wound vac in place, no erythema or warmth      The results of significant diagnostics from this hospitalization (including imaging, microbiology, ancillary and laboratory) are listed below for reference.     Microbiology: Recent Results (from the past 240 hour(s))  SARS Coronavirus 2 by RT PCR (hospital order, performed in [facility] hospital lab) *cepheid single result test* Anterior Nasal Swab     Status: None  Collection Time: 08/26/22  1:03 PM   Specimen: Anterior Nasal Swab  Result Value Ref Range Status   SARS Coronavirus 2 by RT PCR NEGATIVE NEGATIVE Final    Comment: (NOTE) SARS-CoV-2 target nucleic acids are NOT DETECTED.  The SARS-CoV-2 RNA is generally detectable in upper and lower respiratory specimens during the acute phase of infection. The lowest concentration of SARS-CoV-2 viral copies this assay can detect is 250 copies / mL. A negative result does not preclude SARS-CoV-2 infection and should not be used as the sole basis for treatment or other patient management decisions.  A negative result may occur with improper specimen collection / handling, submission of specimen other than nasopharyngeal swab, presence of viral mutation(s) within the areas targeted by this assay, and inadequate number of viral copies (<250 copies / mL). A negative result must be combined with clinical observations,  patient history, and epidemiological information.  Fact Sheet for Patients:   https://www.patel.info/  Fact Sheet for Healthcare Providers: https://hall.com/  This test is not yet approved or  cleared by the [place] FDA and has been authorized for detection and/or diagnosis of SARS-CoV-2 by FDA under an Emergency Use Authorization (EUA).  This EUA will remain in effect (meaning this test can be used) for the duration of the COVID-19 declaration under Section 564(b)(1) of the Act, 21 U.S.C. section 360bbb-3(b)(1), unless the authorization is terminated or revoked sooner.  Performed at [facility], [address], [place], [place] [address]   Surgical pcr screen     Status: None   Collection Time: 08/26/22  5:33 PM   Specimen: Nasal Mucosa; Nasal Swab  Result Value Ref Range Status   MRSA, PCR NEGATIVE NEGATIVE Final   Staphylococcus aureus NEGATIVE NEGATIVE Final    Comment: (NOTE) The Xpert SA Assay (FDA approved for NASAL specimens in patients 28 years of age and older), is one component of a comprehensive surveillance program. It is not intended to diagnose infection nor to guide or monitor treatment. Performed at [facility], [address] [place], [place] [address]   Culture, blood (Routine X 2) w Reflex to ID Panel     Status: None (Preliminary result)   Collection Time: 08/28/22  3:19 AM   Specimen: BLOOD LEFT ARM  Result Value Ref Range Status   Specimen Description BLOOD LEFT ARM  Final   Special Requests   Final    BOTTLES DRAWN AEROBIC AND ANAEROBIC Blood Culture adequate volume   Culture   Final    NO GROWTH 1 DAY Performed at [facility], [address] [place], [place] [address]    Report Status PENDING  Incomplete  Culture, blood (Routine X 2) w Reflex to ID Panel     Status: None (Preliminary result)   Collection Time: 08/28/22  3:28 AM   Specimen: BLOOD LEFT HAND  Result  Value Ref Range Status   Specimen Description BLOOD LEFT HAND  Final   Special Requests   Final    BOTTLES DRAWN AEROBIC AND ANAEROBIC Blood Culture adequate volume   Culture   Final    NO GROWTH 1 DAY Performed at [facility], [address] [place], [place] [address]    Report Status PENDING  Incomplete  Aerobic/Anaerobic Culture w Gram Stain (surgical/deep wound)     Status: None (Preliminary result)   Collection Time: 08/28/22  1:18 PM   Specimen: Wound; Body Fluid  Result Value Ref Range Status   Specimen Description   Final  WOUND Performed at [facility], [address] [place], [place] [address]    Special Requests FLUID FROM RIGHT AKA STUMP  Final   Gram Stain NO WBC SEEN NO ORGANISMS SEEN   Final   Culture   Final    NO GROWTH < 24 HOURS Performed at [facility], [place] [address] [place], [place] [address]    Report Status PENDING  Incomplete     Labs: BNP (last 3 results) Recent Labs    01/18/22 1646 08/26/22 0810  BNP 29.0 67.3   Basic Metabolic Panel: Recent Labs  Lab 08/26/22 0810 08/26/22 1303 08/27/22 0533 08/28/22 0849 08/29/22 0525  NA 136  --  139 139  --   K 3.1*  --  3.7 3.7  --   CL 95*  --  100 99  --   CO2 31  --  26 30  --   GLUCOSE 117*  --  127* 109*  --   BUN 19  --  18 21  --   CREATININE 0.83  --  0.73 0.70 0.63  CALCIUM 9.1  --  9.2 9.1  --   MG  --  2.0  --   --   --    Liver Function Tests: No results for input(s): "AST", "ALT", "ALKPHOS", "BILITOT", "PROT", "ALBUMIN" in the last 168 hours. No results for input(s): "LIPASE", "AMYLASE" in the last 168 hours. No results for input(s): "AMMONIA" in the last 168 hours. CBC: Recent Labs  Lab 08/26/22 0810 08/27/22 0533 08/28/22 0319 08/29/22 0525  WBC 7.2 5.9 7.0 5.8  NEUTROABS 4.0  --   --   --   HGB 10.7* 10.4* 10.6* 9.8*  HCT 33.6* 33.0* 33.6* 31.3*  MCV 91.3 91.9 91.6 90.7  PLT 232 238 230 220   Cardiac Enzymes: No results for  input(s): "CKTOTAL", "CKMB", "CKMBINDEX", "TROPONINI" in the last 168 hours. BNP: Invalid input(s): "POCBNP" CBG: Recent Labs  Lab 08/27/22 0726 08/28/22 0730 08/28/22 1355 08/29/22 0739  GLUCAP 105* 125* 126* 183*   D-Dimer No results for input(s): "DDIMER" in the last 72 hours. Hgb A1c No results for input(s): "HGBA1C" in the last 72 hours. Lipid Profile No results for input(s): "CHOL", "HDL", "LDLCALC", "TRIG", "CHOLHDL", "LDLDIRECT" in the last 72 hours. Thyroid function studies No results for input(s): "TSH", "T4TOTAL", "T3FREE", "THYROIDAB" in the last 72 hours.  Invalid input(s): "FREET3" Anemia work up No results for input(s): "VITAMINB12", "FOLATE", "FERRITIN", "TIBC", "IRON", "RETICCTPCT" in the last 72 hours. Urinalysis  Sepsis Labs Recent Labs  Lab 08/26/22 0810 08/27/22 0533 08/28/22 0319 08/29/22 0525  WBC 7.2 5.9 7.0 5.8   Microbiology Recent Results (from the past 240 hour(s))  SARS Coronavirus 2 by RT PCR (hospital order, performed in [facility] hospital lab) *cepheid single result test* Anterior Nasal Swab     Status: None   Collection Time: 08/26/22  1:03 PM   Specimen: Anterior Nasal Swab  Result Value Ref Range Status   SARS Coronavirus 2 by RT PCR NEGATIVE NEGATIVE Final    Comment: (NOTE) SARS-CoV-2 target nucleic acids are NOT DETECTED.  The SARS-CoV-2 RNA is generally detectable in upper and lower respiratory specimens during the acute phase of infection. The lowest concentration of SARS-CoV-2 viral copies this assay can detect is 250 copies / mL. A negative result does not preclude SARS-CoV-2 infection and should not be used as the sole basis for treatment or other patient management decisions.  A negative result may occur  with improper specimen collection / handling, submission of specimen other than nasopharyngeal swab, presence of viral mutation(s) within the areas targeted by this assay, and inadequate number of viral copies (<250  copies / mL). A negative result must be combined with clinical observations, patient history, and epidemiological information.  Fact Sheet for Patients:   https://www.patel.info/  Fact Sheet for Healthcare Providers: https://hall.com/  This test is not yet approved or  cleared by the [place] FDA and has been authorized for detection and/or diagnosis of SARS-CoV-2 by FDA under an Emergency Use Authorization (EUA).  This EUA will remain in effect (meaning this test can be used) for the duration of the COVID-19 declaration under Section 564(b)(1) of the Act, 21 U.S.C. section 360bbb-3(b)(1), unless the authorization is terminated or revoked sooner.  Performed at [facility], [address], [place], [place] [address]   Surgical pcr screen     Status: None   Collection Time: 08/26/22  5:33 PM   Specimen: Nasal Mucosa; Nasal Swab  Result Value Ref Range Status   MRSA, PCR NEGATIVE NEGATIVE Final   Staphylococcus aureus NEGATIVE NEGATIVE Final    Comment: (NOTE) The Xpert SA Assay (FDA approved for NASAL specimens in patients 24 years of age and older), is one component of a comprehensive surveillance program. It is not intended to diagnose infection nor to guide or monitor treatment. Performed at [facility], [address] [place], [place] [address]   Culture, blood (Routine X 2) w Reflex to ID Panel     Status: None (Preliminary result)   Collection Time: 08/28/22  3:19 AM   Specimen: BLOOD LEFT ARM  Result Value Ref Range Status   Specimen Description BLOOD LEFT ARM  Final   Special Requests   Final    BOTTLES DRAWN AEROBIC AND ANAEROBIC Blood Culture adequate volume   Culture   Final    NO GROWTH 1 DAY Performed at [facility], [address], [place], [place] [address]    Report Status PENDING  Incomplete  Culture, blood (Routine X 2) w Reflex to ID Panel     Status: None (Preliminary  result)   Collection Time: 08/28/22  3:28 AM   Specimen: BLOOD LEFT HAND  Result Value Ref Range Status   Specimen Description BLOOD LEFT HAND  Final   Special Requests   Final    BOTTLES DRAWN AEROBIC AND ANAEROBIC Blood Culture adequate volume   Culture   Final    NO GROWTH 1 DAY Performed at [facility], [address], [place], [place] [address]    Report Status PENDING  Incomplete  Aerobic/Anaerobic Culture w Gram Stain (surgical/deep wound)     Status: None (Preliminary result)   Collection Time: 08/28/22  1:18 PM   Specimen: Wound; Body Fluid  Result Value Ref Range Status   Specimen Description   Final    WOUND Performed at [facility], [address] [place], [place] [address]    Special Requests FLUID FROM RIGHT AKA STUMP  Final   Gram Stain NO WBC SEEN NO ORGANISMS SEEN   Final   Culture   Final    NO GROWTH < 24 HOURS Performed at [facility], [address] [place], [place] [address]    Report Status PENDING  Incomplete   Imaging CT FEMUR RIGHT W CONTRAST  Result Date: 08/26/2022 CLINICAL DATA:  Right leg pain.  History of above knee amputation EXAM: CT OF THE LOWER RIGHT  EXTREMITY WITH CONTRAST TECHNIQUE: Multidetector CT imaging of the lower right extremity was performed according to the standard protocol following intravenous contrast administration. RADIATION DOSE REDUCTION: This exam was performed according to the departmental dose-optimization program which includes automated exposure control, adjustment of the mA and/or kV according to patient size and/or use of iterative reconstruction technique. CONTRAST:  12m OMNIPAQUE IOHEXOL 300 MG/ML  SOLN COMPARISON:  X-ray 08/26/2022, CT 07/30/2022 FINDINGS: Bones/Joint/Cartilage Postsurgical changes of above knee amputation of the right leg. Stippled appearance of the anterior greater than posterior cortex of the distal femoral diaphysis is unchanged in appearance compared to the prior CT  and may be related to demineralization (series 4, image 309). Erosive/destructive changes is felt to be less likely given the lack of interval change. Amputation margin remains sharp. Proximal femur is intact without fracture or dislocation. The visualized pelvis is intact without fracture or diastasis. No lytic or sclerotic bone lesion. Ligaments Suboptimally assessed by CT. Muscles and Tendons Amputation changes of the distal thigh musculotendinous structures with associated muscle atrophy. Soft tissues Complex mixed attenuation fluid collection surrounding the distal femoral resection margin has increased in size from prior, now measuring 7.0 x 6.0 x 6.7 cm (series 3, image 323), previously measured 4.5 x 2.7 x 2.3 cm. Collection demonstrates thick peripheral enhancement. No internal soft tissue gas. Relative lack of stranding/inflammatory changes within the adjacent subcutaneous fat. No right inguinal lymphadenopathy. Large ventral abdominal wall hernia containing non compromised appearing loops of large and small bowel. Partially imaged aortoiliac stent graft. IMPRESSION: 1. Postsurgical changes of above knee amputation of the right leg. 2. Complex mixed attenuation fluid collection surrounding the distal femoral resection margin has increased in size from prior, now measuring 7.0 x 6.0 x 6.7 cm, previously measured 4.5 x 2.7 x 2.3 cm. Appearance remains nonspecific and could represent an adventitial bursa, which could be sterile or infected. Abscess is also a concern, although there is relatively little surrounding inflammatory changes 3. Stippled appearance of the distal femoral diaphysis is unchanged in appearance compared to the prior CT and may be related to disuse demineralization. Erosive/destructive changes is felt to be less likely given the lack of interval change. 4. Large ventral abdominal wall hernia containing multiple non-compromised appearing loops of large and small bowel. Electronically Signed    By: NDavina PokeD.O.   On: 08/26/2022 10:08   DG Femur Min 2 Views Right  Result Date: 08/26/2022 CLINICAL DATA:  RIGHT leg/stump pain.  History of AKA in June 2023. EXAM: RIGHT FEMUR 2 VIEWS COMPARISON:  07/30/2022 and prior studies FINDINGS: AKA changes again noted. There is no evidence of acute fracture, subluxation, dislocation or definite radiographic evidence of osteomyelitis. No definite gas in the soft tissues noted. No unexpected radiopaque foreign bodies noted. IMPRESSION: AKA changes without acute abnormality. If there is strong clinical suspicion for acute osteomyelitis, recommend MRI. Electronically Signed   By: JMargarette CanadaM.D.   On: 08/26/2022 08:47      Time coordinating discharge: over 30 minutes  SIGNED:  NEmeterio ReeveDO [facility]

## 2022-08-29 NOTE — Evaluation (Signed)
Occupational Therapy Evaluation Patient Details Name: Carla [name] MRN: [id number] DOB: September 17, 1961 Today's Date: 08/29/2022   History of Present Illness Pt is a 61 y.o. female with medical history significant of PAD status post right AKA and left axillary femoral bypass graft, diastolic CHF, COPD, and superior mesenteric artery thrombus and atrial fibrillation on Eliquis, hypertension, hyperlipidemia, depression, CAD, obesity, chronic pain, anemia, RLE abscess at AKA site, who presented with increasing pain in right AKA stump.  MD assessment includes: Pain in residual limb, fluid collection on CT concern for possible abscess, chronic mesenteric ischemia, hypokalemia, and iron deficiency anemia. Pt had RLE I&D 08/29/22; now with wound vac.   Clinical Impression   Patient received for OT evaluation. See flowsheet below for details of function. Generally, patient requiring MIN A for bed mobility, unable to perform functional mobility, and MAX A for ADLs. Patient will benefit from continued OT while in acute care.     Recommendations for follow up therapy are one component of a multi-disciplinary discharge planning process, led by the attending physician.  Recommendations may be updated based on patient status, additional functional criteria and insurance authorization.   Follow Up Recommendations  Skilled nursing-short term rehab (<3 hours/day)    Assistance Recommended at Discharge Frequent or constant Supervision/Assistance  Patient can return home with the following Two people to help with walking and/or transfers;Two people to help with bathing/dressing/bathroom    Functional Status Assessment  Patient has had a recent decline in their functional status and demonstrates the ability to make significant improvements in function in a reasonable and predictable amount of time.  Equipment Recommendations  Other (comment) (defer to next venue of care)    Recommendations for Other Services        Precautions / Restrictions Precautions Precautions: Fall (wound vac on RLE) Restrictions Weight Bearing Restrictions: Yes Other Position/Activity Restrictions: wound vac on RLE      Mobility Bed Mobility Overal bed mobility: Needs Assistance Bed Mobility: Sit to Supine, Supine to Sit     Supine to sit: Min assist Sit to supine: Min assist   General bed mobility comments: Min A for BLE and trunk control    Transfers                   General transfer comment: Did not attempt standing today 2/2 pt weakness, pain, and only having +1 assist at this time.      Balance Overall balance assessment:  (good sitting balance today)                                         ADL either performed or assessed with clinical judgement   ADL Overall ADL's : Needs assistance/impaired Eating/Feeding: Set up;Bed level Eating/Feeding Details (indicate cue type and reason): anticipated, based on BIL UE function today Grooming: Brushing hair;Sitting;Maximal assistance Grooming Details (indicate cue type and reason): OT assisted pt in scratching back, applying lotion to back, and combing hair and putting hair up in a bun.                               General ADL Comments: Good sitting balance at EOB, despite LLE not being on the floor (pt not wanting to don non-skid sock). Pt able to sit EOB for 20 minutes; no trunk support.     Vision  Perception     Praxis      Pertinent Vitals/Pain Pain Assessment Pain Assessment: 0-10 Pain Score: 9  Pain Location: RLE Pain Descriptors / Indicators: Aching, Sore Pain Intervention(s): Limited activity within patient's tolerance, Premedicated before session, Repositioned     Hand Dominance Right   Extremity/Trunk Assessment Upper Extremity Assessment Upper Extremity Assessment: LUE deficits/detail LUE Deficits / Details: Pt states that she has LUE deficits; not formally tested; pt able to use  LUE functionally to pull on bed rail and assist herself with t/f to EOB.   Lower Extremity Assessment Lower Extremity Assessment: Defer to PT evaluation       Communication Communication Communication: No difficulties   Cognition Arousal/Alertness: Awake/alert Behavior During Therapy: WFL for tasks assessed/performed Overall Cognitive Status: Within Functional Limits for tasks assessed                                       General Comments       Exercises     Shoulder Instructions      Home Living Family/patient expects to be discharged to:: Private residence Living Arrangements: Children;Other relatives Available Help at Discharge: Family;Available PRN/intermittently Type of Home: Mobile home Home Access: Level entry     Home Layout: One level     Bathroom Shower/Tub: Teacher, early years/pre: Standard Bathroom Accessibility: No   Home Equipment: Wheelchair - manual;Shower seat          Prior Functioning/Environment Prior Level of Function : Needs assist       Physical Assist : Mobility (physical);ADLs (physical) Mobility (physical): Bed mobility;Transfers ADLs (physical): Bathing;Dressing;IADLs;Toileting Mobility Comments: Pt requires min A for stand/squat pivot transfers to/from wheelchair; requires PRN assist for w/c mobility in the home; pt states that she has not been able to stand since her heart surgery several weeks ago. ADLs Comments: Daughter assists with ADLs as needed. More recently pt in SNF for rehab        OT Problem List: Decreased strength;Impaired balance (sitting and/or standing);Decreased knowledge of use of DME or AE;Impaired UE functional use      OT Treatment/Interventions: Self-care/ADL training;Patient/family education;Therapeutic exercise;Balance training;Therapeutic activities;DME and/or AE instruction    OT Goals(Current goals can be found in the care plan section) Acute Rehab OT Goals Patient Stated  Goal: Be home by Thanksgiving. OT Goal Formulation: With patient Time For Goal Achievement: 09/12/22 Potential to Achieve Goals: Fair ADL Goals Pt Will Perform Grooming: sitting;with set-up Pt Will Perform Lower Body Dressing: bed level;with set-up Pt Will Transfer to Toilet: with transfer board;bedside commode;with min assist  OT Frequency: Min 2X/week    Co-evaluation              AM-PAC OT "6 Clicks" Daily Activity     Outcome Measure Help from another person eating meals?: None Help from another person taking care of personal grooming?: A Lot Help from another person toileting, which includes using toliet, bedpan, or urinal?: Total Help from another person bathing (including washing, rinsing, drying)?: A Lot Help from another person to put on and taking off regular upper body clothing?: A Little Help from another person to put on and taking off regular lower body clothing?: Total 6 Click Score: 13   End of Session Nurse Communication: Mobility status  Activity Tolerance: Patient tolerated treatment well Patient left: in bed;with call bell/phone within reach;with bed alarm set  OT Visit Diagnosis:  Muscle weakness (generalized) (M62.81);Unsteadiness on feet (R26.81)                Time: 0935-1006 OT Time Calculation (min): 31 min Charges:  OT General Charges $OT Visit: 1 Visit OT Evaluation $OT Eval Moderate Complexity: 1 Mod OT Treatments $Self Care/Home Management : 8-22 mins  Waymon Amato, MS, OTR/L   Vania Rea 08/29/2022, 12:01 PM

## 2022-08-29 NOTE — Care Management Important Message (Signed)
Important Message  Patient Details  Name: Carla [name] MRN: [id number] Date of Birth: 1960-12-04   Medicare Important Message Given:  N/A - LOS <3 / Initial given by admissions     Juliann Pulse A Jeanee Fabre 08/29/2022, 9:21 AM

## 2022-08-29 NOTE — Progress Notes (Signed)
[place] for heparin infusion initiation and monitoring Indication: hx of superior mesenteric artery thrombosis and A fib   Allergies  Allergen Reactions   Gabapentin     Tremors    Bactrim [Sulfamethoxazole-Trimethoprim] Itching    Patient Measurements: Height: '5\' 2"'$  (157.5 cm) Weight: 109.4 kg (241 lb 2.9 oz) IBW/kg (Calculated) : 50.1 Heparin Dosing Weight: 75 kg  Vital Signs: BP: 126/65 (11/07 0345) Pulse Rate: 87 (11/07 0345)  Labs: Recent Labs    08/26/22 1316 08/26/22 1922 08/27/22 0533 08/28/22 0319 08/28/22 0849 08/28/22 2201 08/29/22 0525  HGB  --   --  10.4* 10.6*  --   --  9.8*  HCT  --   --  33.0* 33.6*  --   --  31.3*  PLT  --   --  238 230  --   --  220  APTT >200*   < > 85* 65*  --  55* 134*  LABPROT 16.2*  --   --   --   --   --   --   INR 1.3*  --   --   --   --   --   --   HEPARINUNFRC >1.10*  --  >1.10* 1.10*  --   --  >1.10*  CREATININE  --   --  0.73  --  0.70  --  0.63   < > = values in this interval not displayed.     Estimated Creatinine Clearance: 84.9 mL/min (by C-G formula based on SCr of 0.63 mg/dL).   Medical History: Past Medical History:  Diagnosis Date   Abdominal aortic atherosclerosis ([place])    a. 05/2017 CTA abd/pelvis: significant atherosclerotic dzs of the infrarenal abd Ao w/ some mural thrombus. No aneurysm or dissection.   Acute focal ischemia of small intestine (HCC)    Acute right-sided low back pain with right-sided sciatica 06/13/2017   AKI (acute kidney injury) ([place]) 07/29/2017   Anemia    Anginal pain ([place])    a. 08/2012 Lexiscan MV: EF 54%, non ischemia/infarct.   Anxiety and depression    Baker's cyst of knee, right    a. 07/2016 U/S: 4.1 x 1.4 x 2.9 cystic structure in R poplitetal fossa.   Bell's palsy    CAD (coronary artery disease)    CHF (congestive heart failure) ([place])    a.) TTE 06/20/2017: EF 60-65%, no rwma, G1DD, no source of cardiac emboli.   Chronic  anticoagulation    a.) apixaban   Chronic idiopathic constipation    Chronic mesenteric ischemia (HCC)    Chronic pain    COPD (chronic obstructive pulmonary disease) (HCC)    Dyspnea    Embolus of superior mesenteric artery ([place])    a. 05/2017 CTA Abd/pelvis: apparent thrombus or embolus in prox SMA (70-90%); b. 05/2017 catheter directed tPA, mechanical thrombectomy, and stenting of the SMA.   Essential hypertension with goal blood pressure less than 140/90 01/19/2016   Gastroesophageal reflux disease 01/19/2016   GERD (gastroesophageal reflux disease)    H/O colectomy    History of 2019 novel coronavirus disease (COVID-19) 11/14/2019   History of kidney stones    Hyperlipidemia    Hypertension    Morbid obesity with BMI of 40.0-44.9, adult ([place])    NSTEMI (non-ST elevated myocardial infarction) ([place]) 06/27/2021   a.) high sensitivity troponins trended: 893 --> 1587 --> 1748 --> 868 --> 735 ng/L   Occlusive mesenteric ischemia ([place]) 06/19/2017   Osteoarthritis  PAD (peripheral artery disease) (HCC)    PAF (paroxysmal atrial fibrillation) (HCC)    a.) CHA2DS2-VASc = 6 (sex, CHF, HTN, DVT x2, aortic plaque). b.) rate/rhythm maintained on oral metoprolol tartrate; chronically anticoagulated with standard dose apixaban.   Palpitations    Peripheral edema    Personal history of other malignant neoplasm of skin 06/01/2016   Pneumonia    Pre-diabetes    Primary osteoarthritis of both knees 06/13/2017   SBO (small bowel obstruction) ([place]) 08/06/2017   Skin cancer of nose    Superior mesenteric artery thrombosis (HCC)    Vitamin D deficiency     Assessment: 61 y.o. female with medical history significant for peripheral arterial disease s/p right AKA, chronic diastolic dysfunction CHF, anxiety and depression, COPD, paroxysmal A-fib on chronic anticoagulation therapy, who presents to the ED with  increasing pain in her right AKA stump. A review of medical records reveals apixaban therapy  prior to arrival (unknown last dose)   Goal of Therapy: Heparin Dosing Weight: 75 kg Heparin level 0.3-0.7 units/ml aPTT 66 - 102 seconds Monitor platelets by anticoagulation protocol: Yes   Date/Time  aPTT/HL  Comment 11/04 1922  aPTT=83  Therapeutic x1  at 1400 un/hr 11/05 0533  85 / >1.1  aPTT therapeutic x 2 11/06 0319  65 / 1.1  aPTT, subtherapeutic 1550 Heparin paused / resumed on 11/06 1530 at prior 1550 11/06 2201  55s / ---  aPTT, subtherapeutic 1550 > 1800 un/h  11/07 0504                135s / > 1.10               aPTT elevated   Plan:  Last eliquis '5mg'$  dose given on 11/3 1700. HL persistently elevated. Will CTM dose by aPTT & check daily HL in AM.  11/7 @ 0525:  aPTT = 134 - aPTT suddenly increased by more than 2X previous level, suspect lab error , will order STAT repeat           HL @ > 1.10, still elevated from Eliquis PTA  - Will continue pt on current rate and recheck HL on 11/8 with AM labs.    Austyn Perriello D Clinical Pharmacist 08/29/2022 6:33 AM

## 2022-08-29 NOTE — Progress Notes (Signed)
[place] for heparin infusion initiation and monitoring Indication: hx of superior mesenteric artery thrombosis and A fib   Allergies  Allergen Reactions   Gabapentin     Tremors    Bactrim [Sulfamethoxazole-Trimethoprim] Itching    Patient Measurements: Height: '5\' 2"'$  (157.5 cm) Weight: 109.4 kg (241 lb 2.9 oz) IBW/kg (Calculated) : 50.1 Heparin Dosing Weight: 75 kg  Vital Signs: Temp: 98 F (36.7 C) (11/07 1526) BP: 105/61 (11/07 1526) Pulse Rate: 73 (11/07 1526)  Labs: Recent Labs    08/27/22 0533 08/28/22 0319 08/28/22 0849 08/28/22 2201 08/29/22 0525 08/29/22 0728 08/29/22 1608  HGB 10.4* 10.6*  --   --  9.8*  --   --   HCT 33.0* 33.6*  --   --  31.3*  --   --   PLT 238 230  --   --  220  --   --   APTT 85* 65*  --    < > 134* 133* 83*  HEPARINUNFRC >1.10* 1.10*  --   --  >1.10*  --   --   CREATININE 0.73  --  0.70  --  0.63  --   --    < > = values in this interval not displayed.     Estimated Creatinine Clearance: 84.9 mL/min (by C-G formula based on SCr of 0.63 mg/dL).   Medical History: Past Medical History:  Diagnosis Date   Abdominal aortic atherosclerosis ([place])    a. 05/2017 CTA abd/pelvis: significant atherosclerotic dzs of the infrarenal abd Ao w/ some mural thrombus. No aneurysm or dissection.   Acute focal ischemia of small intestine (HCC)    Acute right-sided low back pain with right-sided sciatica 06/13/2017   AKI (acute kidney injury) ([place]) 07/29/2017   Anemia    Anginal pain ([place])    a. 08/2012 Lexiscan MV: EF 54%, non ischemia/infarct.   Anxiety and depression    Baker's cyst of knee, right    a. 07/2016 U/S: 4.1 x 1.4 x 2.9 cystic structure in R poplitetal fossa.   Bell's palsy    CAD (coronary artery disease)    CHF (congestive heart failure) ([place])    a.) TTE 06/20/2017: EF 60-65%, no rwma, G1DD, no source of cardiac emboli.   Chronic anticoagulation    a.) apixaban   Chronic idiopathic  constipation    Chronic mesenteric ischemia (HCC)    Chronic pain    COPD (chronic obstructive pulmonary disease) (HCC)    Dyspnea    Embolus of superior mesenteric artery ([place])    a. 05/2017 CTA Abd/pelvis: apparent thrombus or embolus in prox SMA (70-90%); b. 05/2017 catheter directed tPA, mechanical thrombectomy, and stenting of the SMA.   Essential hypertension with goal blood pressure less than 140/90 01/19/2016   Gastroesophageal reflux disease 01/19/2016   GERD (gastroesophageal reflux disease)    H/O colectomy    History of 2019 novel coronavirus disease (COVID-19) 11/14/2019   History of kidney stones    Hyperlipidemia    Hypertension    Morbid obesity with BMI of 40.0-44.9, adult ([place])    NSTEMI (non-ST elevated myocardial infarction) ([place]) 06/27/2021   a.) high sensitivity troponins trended: 893 --> 1587 --> 1748 --> 868 --> 735 ng/L   Occlusive mesenteric ischemia ([place]) 06/19/2017   Osteoarthritis    PAD (peripheral artery disease) (HCC)    PAF (paroxysmal atrial fibrillation) (HCC)    a.) CHA2DS2-VASc = 6 (sex, CHF, HTN, DVT x2, aortic plaque). b.) rate/rhythm maintained  on oral metoprolol tartrate; chronically anticoagulated with standard dose apixaban.   Palpitations    Peripheral edema    Personal history of other malignant neoplasm of skin 06/01/2016   Pneumonia    Pre-diabetes    Primary osteoarthritis of both knees 06/13/2017   SBO (small bowel obstruction) ([place]) 08/06/2017   Skin cancer of nose    Superior mesenteric artery thrombosis (HCC)    Vitamin D deficiency     PTA anticoagulation: Last Eliquis '5mg'$  dose given on 11/3 1700.   Assessment: 61 y.o. female with medical history significant for peripheral arterial disease s/p right AKA, chronic diastolic dysfunction CHF, anxiety and depression, COPD, paroxysmal A-fib on chronic anticoagulation therapy, who presents to the ED with  increasing pain in her right AKA stump. A review of medical records reveals  apixaban therapy prior to arrival (unknown last dose)   Slight downtrend in Hgb 11/7 AM after procedure on 11/6. Plts remain WNL. CTM.  Date/Time  aPTT/HL  Comment 11/04 1922  aPTT=83  Therapeutic x1  at 1400 un/hr 11/05 0533  85 / >1.1  aPTT therapeutic x 2 11/06 0319  65 / 1.1  aPTT, subtherapeutic 1550  Heparin paused / resumed on 11/06 1530 at prior 1550  11/06 2201  55s / ---  aPTT, subtherapeutic 1550 > 1800 un/h 11/07 0504  135s / > 1.10               aPTT elevated. Question of lab error 11/07 0728  133s / > 1.10  aPTT elevated on repeat lab draw. Unclear if HL correlating 11/07 1608  83 / ----  aPTT therapeutic x. Continue current rate  Goal of Therapy:  Heparin level 0.3-0.7 units/ml aPTT 66 - 102 seconds Monitor platelets by anticoagulation protocol: Yes    Plan:  Continue heparin infusion at 1650 units/hr. Recheck the aPTT in 6 hours  Continue to dose via aPTT levels until correlation with heparin level CBC daily    Darrick Penna Clinical Pharmacist 08/29/2022 5:03 PM

## 2022-08-29 NOTE — Progress Notes (Addendum)
[facility] for heparin infusion initiation and monitoring Indication: hx of superior mesenteric artery thrombosis and A fib   Allergies  Allergen Reactions   Gabapentin     Tremors    Bactrim [Sulfamethoxazole-Trimethoprim] Itching    Patient Measurements: Height: '5\' 2"'$  (157.5 cm) Weight: 109.4 kg (241 lb 2.9 oz) IBW/kg (Calculated) : 50.1 Heparin Dosing Weight: 75 kg  Vital Signs: Temp: 98 F (36.7 C) (11/07 0735) BP: 112/54 (11/07 0735) Pulse Rate: 74 (11/07 0735)  Labs: Recent Labs    08/26/22 1316 08/26/22 1922 08/27/22 0533 08/28/22 0319 08/28/22 0849 08/28/22 2201 08/29/22 0525 08/29/22 0728  HGB  --    < > 10.4* 10.6*  --   --  9.8*  --   HCT  --   --  33.0* 33.6*  --   --  31.3*  --   PLT  --   --  238 230  --   --  220  --   APTT >200*   < > 85* 65*  --  55* 134* 133*  LABPROT 16.2*  --   --   --   --   --   --   --   INR 1.3*  --   --   --   --   --   --   --   HEPARINUNFRC >1.10*  --  >1.10* 1.10*  --   --  >1.10*  --   CREATININE  --   --  0.73  --  0.70  --  0.63  --    < > = values in this interval not displayed.     Estimated Creatinine Clearance: 84.9 mL/min (by C-G formula based on SCr of 0.63 mg/dL).   Medical History: Past Medical History:  Diagnosis Date   Abdominal aortic atherosclerosis ([place])    a. 05/2017 CTA abd/pelvis: significant atherosclerotic dzs of the infrarenal abd Ao w/ some mural thrombus. No aneurysm or dissection.   Acute focal ischemia of small intestine (HCC)    Acute right-sided low back pain with right-sided sciatica 06/13/2017   AKI (acute kidney injury) ([place]) 07/29/2017   Anemia    Anginal pain ([place])    a. 08/2012 Lexiscan MV: EF 54%, non ischemia/infarct.   Anxiety and depression    Baker's cyst of knee, right    a. 07/2016 U/S: 4.1 x 1.4 x 2.9 cystic structure in R poplitetal fossa.   Bell's palsy    CAD (coronary artery disease)    CHF (congestive heart failure) ([place])     a.) TTE 06/20/2017: EF 60-65%, no rwma, G1DD, no source of cardiac emboli.   Chronic anticoagulation    a.) apixaban   Chronic idiopathic constipation    Chronic mesenteric ischemia (HCC)    Chronic pain    COPD (chronic obstructive pulmonary disease) (HCC)    Dyspnea    Embolus of superior mesenteric artery ([place])    a. 05/2017 CTA Abd/pelvis: apparent thrombus or embolus in prox SMA (70-90%); b. 05/2017 catheter directed tPA, mechanical thrombectomy, and stenting of the SMA.   Essential hypertension with goal blood pressure less than 140/90 01/19/2016   Gastroesophageal reflux disease 01/19/2016   GERD (gastroesophageal reflux disease)    H/O colectomy    History of 2019 novel coronavirus disease (COVID-19) 11/14/2019   History of kidney stones    Hyperlipidemia    Hypertension    Morbid obesity with BMI of 40.0-44.9, adult ([place])    NSTEMI (non-ST  elevated myocardial infarction) ([place]) 06/27/2021   a.) high sensitivity troponins trended: 893 --> 1587 --> 1748 --> 868 --> 735 ng/L   Occlusive mesenteric ischemia ([place]) 06/19/2017   Osteoarthritis    PAD (peripheral artery disease) (HCC)    PAF (paroxysmal atrial fibrillation) (HCC)    a.) CHA2DS2-VASc = 6 (sex, CHF, HTN, DVT x2, aortic plaque). b.) rate/rhythm maintained on oral metoprolol tartrate; chronically anticoagulated with standard dose apixaban.   Palpitations    Peripheral edema    Personal history of other malignant neoplasm of skin 06/01/2016   Pneumonia    Pre-diabetes    Primary osteoarthritis of both knees 06/13/2017   SBO (small bowel obstruction) ([place]) 08/06/2017   Skin cancer of nose    Superior mesenteric artery thrombosis (HCC)    Vitamin D deficiency     PTA anticoagulation: Last Eliquis '5mg'$  dose given on 11/3 1700.   Assessment: 61 y.o. female with medical history significant for peripheral arterial disease s/p right AKA, chronic diastolic dysfunction CHF, anxiety and depression, COPD, paroxysmal A-fib on  chronic anticoagulation therapy, who presents to the ED with  increasing pain in her right AKA stump. A review of medical records reveals apixaban therapy prior to arrival (unknown last dose)   Slight downtrend in Hgb 11/7 AM after procedure on 11/6. Plts remain WNL. CTM.  Date/Time  aPTT/HL  Comment 11/04 1922  aPTT=83  Therapeutic x1  at 1400 un/hr 11/05 0533  85 / >1.1  aPTT therapeutic x 2 11/06 0319  65 / 1.1  aPTT, subtherapeutic 1550  Heparin paused / resumed on 11/06 1530 at prior 1550  11/06 2201  55s / ---  aPTT, subtherapeutic 1550 > 1800 un/h 11/07 0504  135s / > 1.10               aPTT elevated. Question of lab error 11/07 0728  133s / > 1.10  aPTT elevated on repeat lab draw. Unclear if HL correlating   Goal of Therapy:  Heparin level 0.3-0.7 units/ml aPTT 66 - 102 seconds Monitor platelets by anticoagulation protocol: Yes    Plan:  Hold heparin x 1 hour (held '@0826'$ ). Then, restart heparin infusion at 1650 units/hr (start '@0926'$ ). aPTT in 6 hours after restart.  Continue to dose via aPTT levels until correlation with heparin level CBC daily    [place] Pharmacist 08/29/2022 8:12 AM

## 2022-08-30 DIAGNOSIS — S7011XA Contusion of right thigh, initial encounter: Secondary | ICD-10-CM

## 2022-08-30 DIAGNOSIS — I5022 Chronic systolic (congestive) heart failure: Secondary | ICD-10-CM

## 2022-08-30 LAB — CREATININE, SERUM
Creatinine, Ser: 0.75 mg/dL (ref 0.44–1.00)
GFR, Estimated: >60 mL/min (ref >60)

## 2022-08-30 LAB — GLUCOSE, CAPILLARY: Glucose-Capillary: 124 mg/dL — ABNORMAL HIGH (ref 70–99)

## 2022-08-30 LAB — CBC
HCT: 31.8 % — ABNORMAL LOW (ref 36.0–46.0)
Hemoglobin: 10 g/dL — ABNORMAL LOW (ref 12.0–15.0)
MCH: 29 pg (ref 26.0–34.0)
MCHC: 31.4 g/dL (ref 30.0–36.0)
MCV: 92.2 fL (ref 80.0–100.0)
Platelets: 224 10*3/uL (ref 150–400)
RBC: 3.45 MIL/uL — ABNORMAL LOW (ref 3.87–5.11)
RDW: 16.8 % — ABNORMAL HIGH (ref 11.5–15.5)
WBC: 7.5 10*3/uL (ref 4.0–10.5)
nRBC: 0 % (ref 0.0–0.2)

## 2022-08-30 NOTE — Care Management Important Message (Signed)
Important Message  Patient Details  Name: FALLYNN GRAVETT MRN: [id number] Date of Birth: Feb 27, 1961   Medicare Important Message Given:  N/A - LOS <3 / Initial given by admissions     Juliann Pulse A Jordi Lacko 08/30/2022, 9:30 AM

## 2022-08-30 NOTE — Progress Notes (Signed)
PROGRESS NOTE  Carla [name]    DOB: 1961-10-09, 61 y.o.  MGQ:[id number]    Code Status: Full Code   DOA: 08/26/2022   LOS: 4   Brief hospital course  Carla [name] is a 61 y.o. female with a PMH significant for PAD status post right AKA and left axillary femoral bypass graft, diastolic CHF, COPD, and superior mesenteric artery thrombus and atrial fibrillation on Eliquis, hypertension, hyperlipidemia, depression, CAD, obesity, chronic pain, anemia, right knee abscess at AKA site.  They presented from SNF to the ED on 08/26/2022 with progressively worsened pain in recent right AKA stump x several days. Had recent abscess at that site and had completed treatment and was discharged to SNF prior to this admission.  In the ED, it was found that they had WBC 7.2, potassium 3.1, GFR> 60, temperature normal, blood pressure 113/67, heart rate 83, RR 24, oxygen saturation 93% on room air.  Negative COVID PCR.  Significant findings included CT R femur (+) "Complex mixed attenuation fluid collection surrounding the distal femoral resection margin has increased in size from prior... nonspecific and could represent an adventitial bursa, which could be sterile or infected. Abscess is also a concern, although there is relatively little surrounding inflammatory changes.  11/06: underwent I&D with evacuation of hematoma right thigh. Per op note, no obvious infection. Culture was collected.  08/30/22 -patient wants to be discharged now. Awaiting insurance authorization to SNF. Worked with PT today.   Assessment & Plan  Principal Problem:   Hematoma of right thigh Active Problems:   Chronic mesenteric ischemia (HCC)   PAD (peripheral artery disease) (HCC)   CAD (coronary artery disease)   Chronic diastolic CHF (congestive heart failure) (HCC)   PAF (paroxysmal atrial fibrillation) (HCC)   COPD (chronic obstructive pulmonary disease) (HCC)   HLD (hyperlipidemia)   Recurrent major depressive disorder (HCC)    Chronic pain   Hypokalemia   Iron deficiency anemia   Morbid obesity with BMI of 40.0-44.9, adult (HCC)   Pressure injury of skin   Status post above-knee amputation of right lower extremity ([place])  S/p R BKA with small hematoma. S/p I&D 11/6  Admitting physician consulted Dr. Pascal Lux of IR for possible drainage, but he recommended surgical debridement.   Admitting physician consulted Dr. Trula Slade of vascular surgery --> s/p I&D 11/06. Cultures collected intra-op 11/06 Vancomycin and Rocephin (patient received 1 dose of Zosyn in ED) blood cultures NGTD Wound cultures NGTD pain control: Continue home MS Contin and Norco, start as needed morphine Vascular surgery consulted, appreciate recs Currently on wound vac   Chronic mesenteric ischemia Continue eliquis   PAD (peripheral artery disease) [facility]):  s/p of left axillary femoral bypass graft, Aspirin, Lipitor   CAD (coronary artery disease):  no CP Aspirin and Lipitor, imdur   Chronic diastolic CHF (congestive heart failure) ([place]):  2D echo on 06/15/2018 showed EF of 60 to 65%.   No signs of CHF exacerbation. Continue home Lasix 20 mg daily and metolazone 2.5 mg daily   PAF (paroxysmal atrial fibrillation) (HCC) Eliquis  Metoprolol   COPD (chronic obstructive pulmonary disease) (HCC): Stable Bronchodilators   HLD (hyperlipidemia) Lipitor   Recurrent major depressive disorder ([place]) Continue home medications   Chronic pain: Continue home MS Contin and Norco   Hypokalemia - resolved Potassium 3.1 on admission Repleted potassium Check a magnesium level --> WNL Follow BMP   Iron deficiency anemia:  Hemoglobin stable.  10.7 (8.1 on 08/09/2022) Continue home iron supplement  Morbid obesity with BMI of 40.0-44.9, adult [facility]):   BMI= 41.37  and BW= 102.6 Kg Diet and exercise.   Encouraged to lose weight.  Body mass index is 44.11 kg/m.  VTE ppx:  apixaban (ELIQUIS) tablet 5 mg   Diet:     Diet   Diet Carb  Modified Fluid consistency: Thin; Room service appropriate? Yes   Consultants: Vascular surgery Subjective 08/30/22    Pt reports upset about her pain. She wants to be discharged to SNF. Doesn't understand why she can't just go back to her previous SNF room since all her belongings were already there.    Objective   Vitals:   08/29/22 1526 08/29/22 2052 08/30/22 0521 08/30/22 0700  BP: 105/61 110/63 116/64 118/70  Pulse: 73 75 64 60  Resp: '16 18 16 18  '$ Temp: 98 F (36.7 C) 98 F (36.7 C) 98.2 F (36.8 C) 98 F (36.7 C)  TempSrc:      SpO2: 95% 96% 97% 98%  Weight:      Height:        Intake/Output Summary (Last 24 hours) at 08/30/2022 0807 Last data filed at 08/30/2022 0600 Gross per 24 hour  Intake --  Output 2800 ml  Net -2800 ml   Filed Weights   08/28/22 0500 08/28/22 1131 08/29/22 0500  Weight: 104.8 kg 104.8 kg 109.4 kg     Physical Exam:  General: awake, alert, NAD HEENT: atraumatic, clear conjunctiva, anicteric sclera, MMM, hearing grossly normal Respiratory: normal respiratory effort. Cardiovascular: quick capillary refill Nervous: A&O x3. no gross focal neurologic deficits, normal speech Extremities: previous amputation site in wound vac with scant blood draining.  Skin: dry, intact, normal temperature, normal color. No rashes, lesions or ulcers on exposed skin Psychiatry: agitated mood, poor insight  Labs   I have personally reviewed the following labs and imaging studies CBC    Component Value Date/Time   WBC 7.5 08/30/2022 0420   RBC 3.45 (L) 08/30/2022 0420   HGB 10.0 (L) 08/30/2022 0420   HGB 13.4 02/12/2015 1137   HCT 31.8 (L) 08/30/2022 0420   HCT 40.9 02/12/2015 1137   PLT 224 08/30/2022 0420   PLT 272 02/12/2015 1137   MCV 92.2 08/30/2022 0420   MCV 90 02/12/2015 1137   MCH 29.0 08/30/2022 0420   MCHC 31.4 08/30/2022 0420   RDW 16.8 (H) 08/30/2022 0420   RDW 14.8 (H) 02/12/2015 1137   LYMPHSABS 2.2 08/26/2022 0810   LYMPHSABS 1.7  08/27/2014 1145   MONOABS 0.7 08/26/2022 0810   MONOABS 0.4 08/27/2014 1145   EOSABS 0.3 08/26/2022 0810   EOSABS 0.0 08/27/2014 1145   BASOSABS 0.0 08/26/2022 0810   BASOSABS 0.0 08/27/2014 1145      Latest Ref Rng & Units 08/30/2022    4:20 AM 08/29/2022    5:25 AM 08/28/2022    8:49 AM  BMP  Glucose 70 - 99 mg/dL   109   BUN 8 - 23 mg/dL   21   Creatinine 0.44 - 1.00 mg/dL 0.75  0.63  0.70   Sodium 135 - 145 mmol/L   139   Potassium 3.5 - 5.1 mmol/L   3.7   Chloride 98 - 111 mmol/L   99   CO2 22 - 32 mmol/L   30   Calcium 8.9 - 10.3 mg/dL   9.1     No results found.  Disposition Plan & Communication  Patient status: Inpatient  Admitted From: SNF Planned disposition location: Skilled  nursing facility Anticipated discharge date: 11/9 pending insurance authorization  Family Communication: none    Author: Richarda Osmond, DO [facility] 08/30/2022, 8:07 AM   Available by Epic secure chat 7AM-7PM. If 7PM-7AM, please contact night-coverage.  [facility] contact information found on [email or web address].

## 2022-08-30 NOTE — Progress Notes (Signed)
Physical Therapy Treatment Patient Details Name: Carla [name] MRN: [id number] DOB: [birth year] Today's Date: 08/30/2022   History of Present Illness Pt is a 61 y.o. female with medical history significant of PAD status post right AKA and left axillary femoral bypass graft, diastolic CHF, COPD, and superior mesenteric artery thrombus and atrial fibrillation on Eliquis, hypertension, hyperlipidemia, depression, CAD, obesity, chronic pain, anemia, RLE abscess at AKA site, who presented with increasing pain in right AKA stump.  MD assessment includes: Pain in residual limb, fluid collection on CT concern for possible abscess, chronic mesenteric ischemia, hypokalemia, and iron deficiency anemia. Pt had RLE I&D 08/29/22; now with wound vac.    PT Comments    Pt required extensive encouragement for even minimal bed level therex per below.  Pt reported general pain and fatigue and was pre-medicated for pain prior to session.  Pt stated "I'll work harder once I'm at rehab".  Extensive education provided on physiological benefits of activity/mobility with pt continuing to decline all but below therex.  Pt will benefit from PT services in a SNF setting upon discharge to safely address deficits listed in patient problem list for decreased caregiver assistance and eventual return to PLOF.    Recommendations for follow up therapy are one component of a multi-disciplinary discharge planning process, led by the attending physician.  Recommendations may be updated based on patient status, additional functional criteria and insurance authorization.  Follow Up Recommendations  Skilled nursing-short term rehab (<3 hours/day) Can patient physically be transported by private vehicle: No   Assistance Recommended at Discharge Frequent or constant Supervision/Assistance  Patient can return home with the following Two people to help with walking and/or transfers;Two people to help with bathing/dressing/bathroom;Assist for  transportation;Help with stairs or ramp for entrance;Assistance with cooking/housework   Equipment Recommendations  Other (comment) (TBD at next venue of care)    Recommendations for Other Services       Precautions / Restrictions Precautions Precautions: Fall Restrictions Weight Bearing Restrictions: Yes Other Position/Activity Restrictions: wound vac on RLE     Mobility  Bed Mobility               General bed mobility comments: Pt declined bed mobility this session    Transfers                        Ambulation/Gait                   Stairs             Wheelchair Mobility    Modified Rankin (Stroke Patients Only)       Balance                                            Cognition Arousal/Alertness: Awake/alert Behavior During Therapy: WFL for tasks assessed/performed Overall Cognitive Status: Within Functional Limits for tasks assessed                                          Exercises Total Joint Exercises Ankle Circles/Pumps: AROM, Strengthening, Left, 10 reps, 5 reps Quad Sets: Strengthening, Left, 10 reps Gluteal Sets: Strengthening, Both, 10 reps Heel Slides: AROM, Strengthening, Left, 5 reps (small amplitude) Hip ABduction/ADduction: AAROM, Left, 5 reps  Other Exercises Other Exercises: Pt education provided on physiological benefits of activity    General Comments        Pertinent Vitals/Pain Pain Assessment Pain Assessment: 0-10 Pain Score: 9  Pain Location: RLE Pain Descriptors / Indicators: Aching, Sore Pain Intervention(s): Premedicated before session, Monitored during session, Repositioned    Home Living                          Prior Function            PT Goals (current goals can now be found in the care plan section) Progress towards PT goals: PT to reassess next treatment    Frequency    Min 2X/week      PT Plan Current plan remains  appropriate    Co-evaluation              AM-PAC PT "6 Clicks" Mobility   Outcome Measure  Help needed turning from your back to your side while in a flat bed without using bedrails?: A Lot Help needed moving from lying on your back to sitting on the side of a flat bed without using bedrails?: A Lot Help needed moving to and from a bed to a chair (including a wheelchair)?: Total Help needed standing up from a chair using your arms (e.g., wheelchair or bedside chair)?: Total Help needed to walk in hospital room?: Total Help needed climbing 3-5 steps with a railing? : Total 6 Click Score: 8    End of Session   Activity Tolerance: Patient limited by pain Patient left: in bed;with call bell/phone within reach;with bed alarm set Nurse Communication: Mobility status PT Visit Diagnosis: Other abnormalities of gait and mobility (R26.89);Muscle weakness (generalized) (M62.81);Pain Pain - Right/Left: Right Pain - part of body: Leg     Time: 2440-1027 PT Time Calculation (min) (ACUTE ONLY): 12 min  Charges:  $Therapeutic Activity: 8-22 mins                    D. Scott Taylon Coole PT, DPT 08/30/22, 10:23 AM

## 2022-08-30 NOTE — Consult Note (Incomplete)
Pharmacy Antibiotic Note  Carla [name] is a 61 y.o. female  w/ h/o peripheral arterial disease s/p right AKA, chronic diastolic dysfunction CHF, anxiety and depression, COPD, paroxysmal A-fib on chronic anticoagulation therapy, who presents to the ED with increasing pain in her right AKA stump with abscess. Pharmacy has been consulted for vancomycin dosing.  On day 4 of antibiotics. 11/6 received I&D  Plan: Continue vancomycin IV 1500 mg every 24 hours Goal AUC 400-600  Est AUC: 547.7;  Cmin: 12.4 SCr 0.8; TBW, Vd 0.5  Also on ceftriaxone IV 2 grams every 24 hours  Follow up renal function daily and wound culture. Levels at steady state.  Height: '5\' 2"'$  (157.5 cm) Weight: 109.4 kg (241 lb 2.9 oz) IBW/kg (Calculated) : 50.1  Temp (24hrs), Avg:98.1 F (36.7 C), Min:98 F (36.7 C), Max:98.4 F (36.9 C)  Recent Labs  Lab 08/26/22 0810 08/27/22 0533 08/28/22 0319 08/28/22 0849 08/29/22 0525 08/30/22 0420  WBC 7.2 5.9 7.0  --  5.8 7.5  CREATININE 0.83 0.73  --  0.70 0.63 0.75     Estimated Creatinine Clearance: 84.9 mL/min (by C-G formula based on SCr of 0.75 mg/dL).    Allergies  Allergen Reactions   Gabapentin     Tremors    Bactrim [Sulfamethoxazole-Trimethoprim] Itching    Antimicrobials this admission: Zosyn 11/4 >> 11/4 Vancomycin 11/4 >> Ceftriaxone 11/4 >>  Dose adjustments this admission: 11/4 Vancomycin 1,000 mg every 12 hours >> 11/6 1500 mg every 24 hours  Microbiology results: 11/6 BCx: NG x 1 d 11/6 Surgical/Deep wound body fluid: NG x 12 hrs  Thank you for allowing pharmacy to be a part of this patient's care.  Vernelle Emerald 08/30/2022 11:31 AM

## 2022-08-30 NOTE — TOC Progression Note (Signed)
Transition of Care [facility]) - Progression Note    Patient Details  Name: Carla [name] MRN: [id number] Date of Birth: Sep 06, 1961  Transition of Care [facility]) CM/SW [place], RN Phone Number: 08/30/2022, 4:24 PM  Clinical Narrative:    Ins approved to go to [employer] [id number] 11/8-11/10, notified [facility], DC planned for tomorrow   Expected Discharge Plan: Skilled Nursing Facility Barriers to Discharge: Continued Medical Work up  Expected Discharge Plan and Services Expected Discharge Plan: [place]       Living arrangements for the past 2 months: Single Family Home                                       Social Determinants of Health (SDOH) Interventions Housing Interventions: Intervention Not Indicated  Readmission Risk Interventions    07/25/2022   10:49 AM 05/05/2022   10:52 AM 03/24/2022    9:51 AM  Readmission Risk Prevention Plan  Transportation Screening Complete Complete Complete  PCP or Specialist Appt within 3-5 Days   Complete  HRI or [place]   Complete  Social Work Consult for [place] Planning/Counseling   Complete  Palliative Care Screening   Not Applicable  Medication Review Press photographer) Complete Complete Complete  PCP or Specialist appointment within 3-5 days of discharge Complete Complete   HRI or Home Care Consult Complete    SW Recovery Care/Counseling Consult Not Complete Complete   SW Consult Not Complete Comments NA    Palliative Care Screening  Not [place] Patient Refused Patient Refused

## 2022-08-31 DIAGNOSIS — G8929 Other chronic pain: Secondary | ICD-10-CM

## 2022-08-31 DIAGNOSIS — F419 Anxiety disorder, unspecified: Secondary | ICD-10-CM

## 2022-08-31 DIAGNOSIS — L02419 Cutaneous abscess of limb, unspecified: Principal | ICD-10-CM

## 2022-08-31 LAB — CBC
HCT: 32.9 % — ABNORMAL LOW (ref 36.0–46.0)
Hemoglobin: 10.4 g/dL — ABNORMAL LOW (ref 12.0–15.0)
MCH: 29.1 pg (ref 26.0–34.0)
MCHC: 31.6 g/dL (ref 30.0–36.0)
MCV: 92.2 fL (ref 80.0–100.0)
Platelets: 212 10*3/uL (ref 150–400)
RBC: 3.57 MIL/uL — ABNORMAL LOW (ref 3.87–5.11)
RDW: 16.5 % — ABNORMAL HIGH (ref 11.5–15.5)
WBC: 7.8 10*3/uL (ref 4.0–10.5)
nRBC: 0 % (ref 0.0–0.2)

## 2022-08-31 MED ORDER — MORPHINE SULFATE ER 15 MG PO TBCR: 15.0000 mg | EXTENDED_RELEASE_TABLET | Freq: Two times a day (BID) | ORAL | 0 refills | Status: DC

## 2022-08-31 MED ORDER — HYDROCODONE-ACETAMINOPHEN 5-325 MG PO TABS: 1.0000 | ORAL_TABLET | ORAL | 0 refills | Status: DC | PRN

## 2022-08-31 MED ORDER — ALPRAZOLAM 0.5 MG PO TABS
0.2500 mg | ORAL_TABLET | Freq: Once | ORAL | Status: AC
  Administered 2022-08-31: 0.25 mg via ORAL
  Filled 2022-08-31: qty 1

## 2022-08-31 NOTE — Care Management Important Message (Signed)
Important Message  Patient Details  Name: Carla [name] MRN: [id number] Date of Birth: 04-May-1961   Medicare Important Message Given:  Yes     Juliann Pulse A Jaxon Flatt 08/31/2022, 9:26 AM

## 2022-08-31 NOTE — Discharge Summary (Addendum)
Physician Discharge Summary  Patient: Carla [name] WCB:[id number] DOB: 1961/05/07   Code Status: Full Code Admit date: 08/26/2022 Discharge date: 08/31/2022 Disposition: Skilled nursing facility, PT, OT, nurse aid, and RN PCP: [facility], [facility]  Recommendations for Outpatient Follow-up:  Follow up with PCP within 1-2 weeks Generalized hospital follow up Follow up with vascular surgery as directed for wound management.  Wound vac to stay on until follow up. 2-3 weeks, per Dr. 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 female with a PMH significant for PAD status post right AKA and left axillary femoral bypass graft, diastolic CHF, COPD, and superior mesenteric artery thrombus and atrial fibrillation on Eliquis, hypertension, hyperlipidemia, depression, CAD, obesity, chronic pain, anemia, right knee abscess at AKA site.   They presented from SNF to the ED on 08/26/2022 with progressively worsened pain in recent right AKA stump x several days. Had recent abscess at that site and had completed treatment and was discharged to SNF prior to this admission.   In the ED, it was found that they had WBC 7.2, potassium 3.1, GFR> 60, temperature normal, blood pressure 113/67, heart rate 83, RR 24,  oxygen saturation 93% on room air.  Negative COVID PCR.  Significant findings included CT R femur (+) "Complex mixed attenuation fluid collection surrounding the distal femoral resection margin has increased in size from prior... nonspecific and could represent an adventitial bursa, which could be sterile or infected. Abscess is also a concern, although there is relatively little surrounding inflammatory changes.  vascular surgery was consulted and performed I&D 11/6 with evacuation of hematoma right thigh. Per op note, "no obvious infection". Culture was collected. On day of discharge, those cultures were negative x3 days. Blood cultures were also negative x3 days. She was not on antibiotics and remained afebrile with stable vital signs throughout admission.  Patient continued to have significant pain throughout her stay but wound appeared to be healing within normal parameters.  PT/OT evaluated and recommended returning to SNF for further therapy.  Wound vac should be continued for 2-3 weeks until follow up with vascular surgery. She can be discharged with wet to dry dressing to the wound until the wound vac can be delivered to the facility.   Patient was discharged to SNF on her chronic home medications.   Discharge Condition: Stable, improved Recommended discharge diet: Regular healthy diet  Consultations: Vascular surgery  Procedures/Studies: I&D right thigh hematoma 11/6  Discharge Instructions     Diet - low sodium heart healthy   Complete by: As directed    Discharge wound care:   Complete by: As directed    Neg pressure wound therapy (Wound Vac) at continuous 125 mmHg   Increase activity slowly   Complete by: As directed  Allergies as of 08/31/2022       Reactions   Gabapentin    Tremors    Bactrim [sulfamethoxazole-trimethoprim] Itching        Medication List     STOP taking these medications    docusate sodium 100 MG capsule Commonly known as: COLACE    midodrine 5 MG tablet Commonly known as: PROAMATINE   potassium chloride SA 20 MEQ tablet Commonly known as: KLOR-CON M   senna-docusate 8.6-50 MG tablet Commonly known as: Senokot-S       TAKE these medications    alum & mag hydroxide-simeth 200-200-20 MG/5ML suspension Commonly known as: MAALOX/MYLANTA Take 15-30 mLs by mouth every 2 (two) hours as needed for indigestion.   apixaban 5 MG Tabs tablet Commonly known as: ELIQUIS Take 1 tablet (5 mg total) by mouth 2 (two) times daily.   aspirin EC 81 MG tablet Take 81 mg by mouth daily. Swallow whole.   atorvastatin 80 MG tablet Commonly known as: LIPITOR Take 80 mg by mouth at bedtime.   citalopram 40 MG tablet Commonly known as: CeleXA Take 1 tablet (40 mg total) by mouth daily.   Combivent Respimat 20-100 MCG/ACT Aers respimat Generic drug: Ipratropium-Albuterol   esomeprazole 20 MG capsule Commonly known as: NEXIUM Take 20 mg by mouth daily.   feeding supplement Liqd Take 237 mLs by mouth 2 (two) times daily between meals.   ferrous sulfate 325 (65 FE) MG tablet Take 325 mg by mouth daily.   furosemide 40 MG tablet Commonly known as: LASIX Take 0.5 tablets (20 mg total) by mouth daily.   HYDROcodone-acetaminophen 5-325 MG tablet Commonly known as: NORCO/VICODIN Take 1-2 tablets by mouth every 4 (four) hours as needed for severe pain or moderate pain (1 pill for moderate pain, 2 for severe).   isosorbide mononitrate 60 MG 24 hr tablet Commonly known as: IMDUR Take 60 mg by mouth daily.   lidocaine 5 % Commonly known as: LIDODERM Place 1 patch onto the skin daily. Remove & Discard patch within 12 hours or as directed by MD   metolazone 2.5 MG tablet Commonly known as: ZAROXOLYN Take 2.5 mg by mouth daily.   metoprolol tartrate 25 MG tablet Commonly known as: LOPRESSOR Take 0.5 tablets (12.5 mg total) by mouth 2 (two) times daily. What changed: additional instructions   morphine 15 MG 12 hr  tablet Commonly known as: MS CONTIN Take 1 tablet (15 mg total) by mouth every 12 (twelve) hours.   multivitamin with minerals Tabs tablet Take 1 tablet by mouth daily.   ondansetron 4 MG tablet Commonly known as: ZOFRAN Take 4 mg by mouth every 8 (eight) hours as needed for nausea or vomiting.   polyethylene glycol 17 g packet Commonly known as: MIRALAX / GLYCOLAX Take 17 g by mouth daily.   pregabalin 200 MG capsule Commonly known as: LYRICA Take 200 mg by mouth in the morning, at noon, and at bedtime. What changed: Another medication with the same name was removed. Continue taking this medication, and follow the directions you see here.               Discharge Care Instructions  (From admission, onward)           Start     Ordered   08/31/22 0000  Discharge wound care:       Comments: Neg pressure wound therapy (Wound Vac) at continuous 125 mmHg   08/31/22 0942  Contact information for after-discharge care     Destination     [facility] .   Service: Skilled Nursing Contact information: [address] [phone]                     Subjective   Pt reports having significant pain in R leg still. Well controlled with morphine. Requests xanax prior to travel for anxiety. She states that it does not make her sleepy. All her questions and concerns were addressed prior to dc. Objective  Blood pressure (!) 111/54, pulse 74, temperature 97.9 F (36.6 C), resp. rate 17, height '5\' 2"'$  (1.575 m), weight 103.5 kg, SpO2 96 %.   General: Pt is alert, awake, not in acute distress Cardiovascular: RRR, S1/S2 +, no rubs, no gallops Respiratory: CTA bilaterally, no wheezing, no rhonchi Abdominal: Soft, NT, ND, bowel sounds + Extremities: R amputation site in soiled wound vac bandage to be removed prior to dc. Cap refil, distal sensation, and proximal motor intact.    The results of  significant diagnostics from this hospitalization (including imaging, microbiology, ancillary and laboratory) are listed below for reference.   Imaging studies: CT FEMUR RIGHT W CONTRAST  Result Date: 08/26/2022 CLINICAL DATA:  Right leg pain.  History of above knee amputation EXAM: CT OF THE LOWER RIGHT EXTREMITY WITH CONTRAST TECHNIQUE: Multidetector CT imaging of the lower right extremity was performed according to the standard protocol following intravenous contrast administration. RADIATION DOSE REDUCTION: This exam was performed according to the departmental dose-optimization program which includes automated exposure control, adjustment of the mA and/or kV according to patient size and/or use of iterative reconstruction technique. CONTRAST:  168m OMNIPAQUE IOHEXOL 300 MG/ML  SOLN COMPARISON:  X-ray 08/26/2022, CT 07/30/2022 FINDINGS: Bones/Joint/Cartilage Postsurgical changes of above knee amputation of the right leg. Stippled appearance of the anterior greater than posterior cortex of the distal femoral diaphysis is unchanged in appearance compared to the prior CT and may be related to demineralization (series 4, image 309). Erosive/destructive changes is felt to be less likely given the lack of interval change. Amputation margin remains sharp. Proximal femur is intact without fracture or dislocation. The visualized pelvis is intact without fracture or diastasis. No lytic or sclerotic bone lesion. Ligaments Suboptimally assessed by CT. Muscles and Tendons Amputation changes of the distal thigh musculotendinous structures with associated muscle atrophy. Soft tissues Complex mixed attenuation fluid collection surrounding the distal femoral resection margin has increased in size from prior, now measuring 7.0 x 6.0 x 6.7 cm (series 3, image 323), previously measured 4.5 x 2.7 x 2.3 cm. Collection demonstrates thick peripheral enhancement. No internal soft tissue gas. Relative lack of stranding/inflammatory  changes within the adjacent subcutaneous fat. No right inguinal lymphadenopathy. Large ventral abdominal wall hernia containing non compromised appearing loops of large and small bowel. Partially imaged aortoiliac stent graft. IMPRESSION: 1. Postsurgical changes of above knee amputation of the right leg. 2. Complex mixed attenuation fluid collection surrounding the distal femoral resection margin has increased in size from prior, now measuring 7.0 x 6.0 x 6.7 cm, previously measured 4.5 x 2.7 x 2.3 cm. Appearance remains nonspecific and could represent an adventitial bursa, which could be sterile or infected. Abscess is also a concern, although there is relatively little surrounding inflammatory changes 3. Stippled appearance of the distal femoral diaphysis is unchanged in appearance compared to the prior CT and may be related to disuse demineralization. Erosive/destructive changes is felt to  be less likely given the lack of interval change. 4. Large ventral abdominal wall hernia containing multiple non-compromised appearing loops of large and small bowel. Electronically Signed   By: Davina Poke D.O.   On: 08/26/2022 10:08   DG Femur Min 2 Views Right  Result Date: 08/26/2022 CLINICAL DATA:  RIGHT leg/stump pain.  History of AKA in June 2023. EXAM: RIGHT FEMUR 2 VIEWS COMPARISON:  07/30/2022 and prior studies FINDINGS: AKA changes again noted. There is no evidence of acute fracture, subluxation, dislocation or definite radiographic evidence of osteomyelitis. No definite gas in the soft tissues noted. No unexpected radiopaque foreign bodies noted. IMPRESSION: AKA changes without acute abnormality. If there is strong clinical suspicion for acute osteomyelitis, recommend MRI. Electronically Signed   By: Margarette Canada M.D.   On: 08/26/2022 08:47    Labs: Basic Metabolic Panel: Recent Labs  Lab 08/26/22 0810 08/26/22 1303 08/27/22 0533 08/28/22 0849 08/29/22 0525 08/30/22 0420  NA 136  --  139 139  --    --   K 3.1*  --  3.7 3.7  --   --   CL 95*  --  100 99  --   --   CO2 31  --  26 30  --   --   GLUCOSE 117*  --  127* 109*  --   --   BUN 19  --  18 21  --   --   CREATININE 0.83  --  0.73 0.70 0.63 0.75  CALCIUM 9.1  --  9.2 9.1  --   --   MG  --  2.0  --   --   --   --    CBC: Recent Labs  Lab 08/26/22 0810 08/27/22 0533 08/28/22 0319 08/29/22 0525 08/30/22 0420 08/31/22 0411  WBC 7.2 5.9 7.0 5.8 7.5 7.8  NEUTROABS 4.0  --   --   --   --   --   HGB 10.7* 10.4* 10.6* 9.8* 10.0* 10.4*  HCT 33.6* 33.0* 33.6* 31.3* 31.8* 32.9*  MCV 91.3 91.9 91.6 90.7 92.2 92.2  PLT 232 238 230 220 224 212   Microbiology: Results for orders placed or performed during the hospital encounter of 08/26/22  SARS Coronavirus 2 by RT PCR (hospital order, performed in [facility] hospital lab) *cepheid single result test* Anterior Nasal Swab     Status: None   Collection Time: 08/26/22  1:03 PM   Specimen: Anterior Nasal Swab  Result Value Ref Range Status   SARS Coronavirus 2 by RT PCR NEGATIVE NEGATIVE Final    Comment: (NOTE) SARS-CoV-2 target nucleic acids are NOT DETECTED.  The SARS-CoV-2 RNA is generally detectable in upper and lower respiratory specimens during the acute phase of infection. The lowest concentration of SARS-CoV-2 viral copies this assay can detect is 250 copies / mL. A negative result does not preclude SARS-CoV-2 infection and should not be used as the sole basis for treatment or other patient management decisions.  A negative result may occur with improper specimen collection / handling, submission of specimen other than nasopharyngeal swab, presence of viral mutation(s) within the areas targeted by this assay, and inadequate number of viral copies (<250 copies / mL). A negative result must be combined with clinical observations, patient history, and epidemiological information.  Fact Sheet for Patients:   https://www.patel.info/  Fact Sheet for  Healthcare Providers: https://hall.com/  This test is not yet approved or  cleared by the [place] FDA and has been  authorized for detection and/or diagnosis of SARS-CoV-2 by FDA under an Emergency Use Authorization (EUA).  This EUA will remain in effect (meaning this test can be used) for the duration of the COVID-19 declaration under Section 564(b)(1) of the Act, 21 U.S.C. section 360bbb-3(b)(1), unless the authorization is terminated or revoked sooner.  Performed at [facility], [address], [place], [place] [address]   Surgical pcr screen     Status: None   Collection Time: 08/26/22  5:33 PM   Specimen: Nasal Mucosa; Nasal Swab  Result Value Ref Range Status   MRSA, PCR NEGATIVE NEGATIVE Final   Staphylococcus aureus NEGATIVE NEGATIVE Final    Comment: (NOTE) The Xpert SA Assay (FDA approved for NASAL specimens in patients 36 years of age and older), is one component of a comprehensive surveillance program. It is not intended to diagnose infection nor to guide or monitor treatment. Performed at [facility], [address] [place], [place] [address]   Culture, blood (Routine X 2) w Reflex to ID Panel     Status: None (Preliminary result)   Collection Time: 08/28/22  3:19 AM   Specimen: BLOOD LEFT ARM  Result Value Ref Range Status   Specimen Description BLOOD LEFT ARM  Final   Special Requests   Final    BOTTLES DRAWN AEROBIC AND ANAEROBIC Blood Culture adequate volume   Culture   Final    NO GROWTH 3 DAYS Performed at [facility], [address] [place], [place] [address]    Report Status PENDING  Incomplete  Culture, blood (Routine X 2) w Reflex to ID Panel     Status: None (Preliminary result)   Collection Time: 08/28/22  3:28 AM   Specimen: BLOOD LEFT HAND  Result Value Ref Range Status   Specimen Description BLOOD LEFT HAND  Final   Special Requests   Final    BOTTLES DRAWN AEROBIC AND ANAEROBIC  Blood Culture adequate volume   Culture   Final    NO GROWTH 3 DAYS Performed at [facility], [address], [place], [place] [address]    Report Status PENDING  Incomplete  Aerobic/Anaerobic Culture w Gram Stain (surgical/deep wound)     Status: None (Preliminary result)   Collection Time: 08/28/22  1:18 PM   Specimen: Wound; Body Fluid  Result Value Ref Range Status   Specimen Description   Final    WOUND Performed at [facility], [address] [place], [place] [address]    Special Requests FLUID FROM RIGHT AKA STUMP  Final   Gram Stain NO WBC SEEN NO ORGANISMS SEEN   Final   Culture   Final    NO GROWTH 3 DAYS NO ANAEROBES ISOLATED; CULTURE IN PROGRESS FOR 5 DAYS Performed at [facility] [address] [place], [place] [address]    Report Status PENDING  Incomplete    Time coordinating discharge: Over 30 minutes  Richarda Osmond, MD  [facility] 08/31/2022, 9:59 AM

## 2022-08-31 NOTE — TOC Progression Note (Signed)
Transition of Care [facility]) - Progression Note    Patient Details  Name: Carla [name] MRN: [id number] Date of Birth: Apr 23, 1961  Transition of Care [facility]) CM/SW [place], RN Phone Number: 08/31/2022, 1:32 PM  Clinical Narrative:    Confirmed with Ricky at the facility that they will order a woundvac for her and that she will be wet to dry dressing until they get it in place He confirmed I spoke with her daughter and let her know she will dc today   Expected Discharge Plan: Skilled Nursing Facility Barriers to Discharge: Continued Medical Work up  Expected Discharge Plan and Services Expected Discharge Plan: [place] arrangements for the past 2 months: Single Family Home Expected Discharge Date: 08/31/22                                     Social Determinants of Health (SDOH) Interventions Housing Interventions: Intervention Not Indicated  Readmission Risk Interventions    07/25/2022   10:49 AM 05/05/2022   10:52 AM 03/24/2022    9:51 AM  Readmission Risk Prevention Plan  Transportation Screening Complete Complete Complete  PCP or Specialist Appt within 3-5 Days   Complete  HRI or [place]   Complete  Social Work Consult for [place] Planning/Counseling   Complete  Palliative Care Screening   Not Applicable  Medication Review Press photographer) Complete Complete Complete  PCP or Specialist appointment within 3-5 days of discharge Complete Complete   HRI or [place] Complete    SW Recovery Care/Counseling Consult Not Complete Complete   SW Consult Not Complete Comments NA    Palliative Care Screening  Not [place] Patient Refused Patient Refused

## 2022-08-31 NOTE — TOC Progression Note (Signed)
Transition of Care [facility]) - Progression Note    Patient Details  Name: Carla [name] MRN: [id number] Date of Birth: [birth year]  Transition of Care [facility]) CM/SW [place], RN Phone Number: 08/31/2022, 10:15 AM  Clinical Narrative:    The patient will [facility] today  EMS to transport Ins approved Attempted to reach the Daughter [name], was unable to reach, the Mail box is full, the patient will let her family know   Expected Discharge Plan: Skilled Nursing Facility Barriers to Discharge: Continued Medical Work up  Expected Discharge Plan and Services Expected Discharge Plan: [place] arrangements for the past 2 months: Single Family Home Expected Discharge Date: 08/31/22                                     Social Determinants of Health (SDOH) Interventions Housing Interventions: Intervention Not Indicated  Readmission Risk Interventions    07/25/2022   10:49 AM 05/05/2022   10:52 AM 03/24/2022    9:51 AM  Readmission Risk Prevention Plan  Transportation Screening Complete Complete Complete  PCP or Specialist Appt within 3-5 Days   Complete  HRI or [place]   Complete  Social Work Consult for [place] Planning/Counseling   Complete  Palliative Care Screening   Not Applicable  Medication Review Press photographer) Complete Complete Complete  PCP or Specialist appointment within 3-5 days of discharge Complete Complete   HRI or [place] Complete    SW Recovery Care/Counseling Consult Not Complete Complete   SW Consult Not Complete Comments NA    Palliative Care Screening  Not [place] Patient Refused Patient Refused

## 2022-08-31 NOTE — Progress Notes (Signed)
D/c orders placed and transportation arrived. Orders placed to do a wet to dry dressing until transported to facility and would vac will be placed at facility. Me and charge nurse attempted to place wet to dry dressing. Pt began to cry and yell stating " please don't take it off leave it on until I get to the place" black foam left on covered with dressing. Charge nurse and transportation at bedside and facility made aware.

## 2022-08-31 NOTE — Progress Notes (Signed)
Occupational Therapy Treatment Patient Details Name: Carla [name] MRN: [id number] DOB: February 17, 1961 Today's Date: 08/31/2022   History of present illness Pt is a 61 y.o. female with medical history significant of PAD status post right AKA and left axillary femoral bypass graft, diastolic CHF, COPD, and superior mesenteric artery thrombus and atrial fibrillation on Eliquis, hypertension, hyperlipidemia, depression, CAD, obesity, chronic pain, anemia, RLE abscess at AKA site, who presented with increasing pain in right AKA stump.  MD assessment includes: Pain in residual limb, fluid collection on CT concern for possible abscess, chronic mesenteric ischemia, hypokalemia, and iron deficiency anemia. Pt had RLE I&D 08/29/22; now with wound vac.   OT comments  Chart reviewed, pt greeted in bed agreeable to OT tx session targeting improving functional activity tolerance. Pt tearful throughout, requiring max encouragement for participation in tx session however endorses knowledge of importance of mobility while hospitalized. Pt deferred oob activity, states she wants to go back to rehab to continue therapy, however is in agreement to sit on edge of bed for approx 10 minutes completing grooming tasks with SET UP. MIN A required for bed mobility on this date. Pt is making progress towards goals. OT will continue to follow acutely.    Recommendations for follow up therapy are one component of a multi-disciplinary discharge planning process, led by the attending physician.  Recommendations may be updated based on patient status, additional functional criteria and insurance authorization.    Follow Up Recommendations  Skilled nursing-short term rehab (<3 hours/day)    Assistance Recommended at Discharge Frequent or constant Supervision/Assistance  Patient can return home with the following  Two people to help with walking and/or transfers;Two people to help with bathing/dressing/bathroom   Equipment  Recommendations  Other (comment) (defer)    Recommendations for Other Services      Precautions / Restrictions Precautions Precautions: Fall Restrictions Weight Bearing Restrictions: Yes Other Position/Activity Restrictions: wound vac on RLE       Mobility Bed Mobility Overal bed mobility: Needs Assistance Bed Mobility: Sit to Supine, Supine to Sit     Supine to sit: Min assist, HOB elevated Sit to supine: Min assist, HOB elevated   General bed mobility comments: max vcs for encouragement, pt able to perform boost up the bed with bed features    Transfers Overall transfer level: Needs assistance                 General transfer comment: pt declined on this date     Balance Overall balance assessment: Needs assistance Sitting-balance support: Single extremity supported Sitting balance-[name]: Good Sitting balance - Comments: static sitting on the edge of bed for approx 10 minutes                                   ADL either performed or assessed with clinical judgement   ADL Overall ADL's : Needs assistance/impaired Eating/Feeding: Set up;Sitting   Grooming: Wash/dry face;Sitting;Set up               Lower Body Dressing: Maximal assistance                      Extremity/Trunk Assessment              Vision       Perception     Praxis      Cognition Arousal/Alertness: Awake/alert Behavior During Therapy: WFL for tasks  assessed/performed Overall Cognitive Status: No family/caregiver present to determine baseline cognitive functioning Area of Impairment: Problem solving                             Problem Solving: Requires verbal cues, Requires tactile cues          Exercises      Shoulder Instructions       General Comments      Pertinent Vitals/ Pain       Pain Assessment Pain Assessment: 0-10 Pain Score: 10-Worst pain ever Pain Location: RLE Pain Descriptors / Indicators:  Aching, Sore Pain Intervention(s): Monitored during session, Repositioned, Patient requesting pain meds-RN notified  Home Living                                          Prior Functioning/Environment              Frequency  Min 2X/week        Progress Toward Goals  OT Goals(current goals can now be found in the care plan section)  Progress towards OT goals: Progressing toward goals     Plan Discharge plan remains appropriate;Frequency remains appropriate    Co-evaluation                 AM-PAC OT "6 Clicks" Daily Activity     Outcome Measure   Help from another person eating meals?: None Help from another person taking care of personal grooming?: A Lot Help from another person toileting, which includes using toliet, bedpan, or urinal?: Total Help from another person bathing (including washing, rinsing, drying)?: A Lot Help from another person to put on and taking off regular upper body clothing?: A Little Help from another person to put on and taking off regular lower body clothing?: A Lot 6 Click Score: 14    End of Session    OT Visit Diagnosis: Muscle weakness (generalized) (M62.81);Unsteadiness on feet (R26.81)   Activity Tolerance Patient tolerated treatment well   Patient Left in bed;with call bell/phone within reach;with bed alarm set   Nurse Communication Mobility status        Time: 8264-1583 OT Time Calculation (min): 23 min  Charges: OT General Charges $OT Visit: 1 Visit OT Treatments $Self Care/Home Management : 8-22 mins $Therapeutic Activity: 8-22 mins  Shanon Payor, OTD OTR/L  08/31/22, 9:48 AM

## 2022-09-02 LAB — CULTURE, BLOOD (ROUTINE X 2)
Culture: NO GROWTH
Culture: NO GROWTH
Special Requests: ADEQUATE
Special Requests: ADEQUATE

## 2022-09-02 LAB — AEROBIC/ANAEROBIC CULTURE W GRAM STAIN (SURGICAL/DEEP WOUND)
Culture: NO GROWTH
Gram Stain: NONE SEEN

## 2022-09-07 ENCOUNTER — Emergency Department: Payer: Medicare Other

## 2022-09-07 ENCOUNTER — Other Ambulatory Visit: Payer: Self-pay

## 2022-09-07 ENCOUNTER — Observation Stay
Admission: EM | Admit: 2022-09-07 | Discharge: 2022-09-08 | Disposition: A | Discharge status: Skilled Nursing Facility | Payer: Medicare Other | Attending: Osteopathic Medicine | Admitting: Osteopathic Medicine

## 2022-09-07 DIAGNOSIS — R1031 Right lower quadrant pain: Secondary | ICD-10-CM

## 2022-09-07 DIAGNOSIS — I739 Peripheral vascular disease, unspecified: Secondary | ICD-10-CM | POA: Diagnosis present

## 2022-09-07 DIAGNOSIS — E876 Hypokalemia: Secondary | ICD-10-CM | POA: Insufficient documentation

## 2022-09-07 DIAGNOSIS — I959 Hypotension, unspecified: Secondary | ICD-10-CM | POA: Diagnosis not present

## 2022-09-07 DIAGNOSIS — K439 Ventral hernia without obstruction or gangrene: Secondary | ICD-10-CM | POA: Diagnosis not present

## 2022-09-07 DIAGNOSIS — Z9049 Acquired absence of other specified parts of digestive tract: Secondary | ICD-10-CM | POA: Diagnosis not present

## 2022-09-07 DIAGNOSIS — I110 Hypertensive heart disease with heart failure: Secondary | ICD-10-CM | POA: Diagnosis not present

## 2022-09-07 DIAGNOSIS — I2510 Atherosclerotic heart disease of native coronary artery without angina pectoris: Secondary | ICD-10-CM | POA: Diagnosis not present

## 2022-09-07 DIAGNOSIS — Z7982 Long term (current) use of aspirin: Secondary | ICD-10-CM | POA: Insufficient documentation

## 2022-09-07 DIAGNOSIS — J449 Chronic obstructive pulmonary disease, unspecified: Secondary | ICD-10-CM | POA: Diagnosis not present

## 2022-09-07 DIAGNOSIS — I10 Essential (primary) hypertension: Secondary | ICD-10-CM | POA: Diagnosis present

## 2022-09-07 DIAGNOSIS — Z79899 Other long term (current) drug therapy: Secondary | ICD-10-CM | POA: Insufficient documentation

## 2022-09-07 DIAGNOSIS — I5032 Chronic diastolic (congestive) heart failure: Secondary | ICD-10-CM | POA: Insufficient documentation

## 2022-09-07 DIAGNOSIS — K436 Other and unspecified ventral hernia with obstruction, without gangrene: Secondary | ICD-10-CM | POA: Diagnosis present

## 2022-09-07 DIAGNOSIS — Z87891 Personal history of nicotine dependence: Secondary | ICD-10-CM | POA: Insufficient documentation

## 2022-09-07 DIAGNOSIS — I480 Paroxysmal atrial fibrillation: Secondary | ICD-10-CM | POA: Insufficient documentation

## 2022-09-07 DIAGNOSIS — K551 Chronic vascular disorders of intestine: Secondary | ICD-10-CM | POA: Diagnosis present

## 2022-09-07 DIAGNOSIS — Z89422 Acquired absence of other left toe(s): Secondary | ICD-10-CM | POA: Insufficient documentation

## 2022-09-07 DIAGNOSIS — Z96651 Presence of right artificial knee joint: Secondary | ICD-10-CM | POA: Insufficient documentation

## 2022-09-07 DIAGNOSIS — F32A Anxiety disorder, unspecified: Secondary | ICD-10-CM | POA: Diagnosis present

## 2022-09-07 DIAGNOSIS — Z89611 Acquired absence of right leg above knee: Secondary | ICD-10-CM | POA: Insufficient documentation

## 2022-09-07 DIAGNOSIS — Z85828 Personal history of other malignant neoplasm of skin: Secondary | ICD-10-CM | POA: Diagnosis not present

## 2022-09-07 DIAGNOSIS — Z7901 Long term (current) use of anticoagulants: Secondary | ICD-10-CM | POA: Diagnosis not present

## 2022-09-07 DIAGNOSIS — R109 Unspecified abdominal pain: Secondary | ICD-10-CM | POA: Diagnosis present

## 2022-09-07 DIAGNOSIS — F419 Anxiety disorder, unspecified: Secondary | ICD-10-CM | POA: Diagnosis present

## 2022-09-07 LAB — COMPREHENSIVE METABOLIC PANEL
ALT: 14 U/L (ref 0–44)
AST: 20 U/L (ref 15–41)
Albumin: 2.7 g/dL — ABNORMAL LOW (ref 3.5–5.0)
Alkaline Phosphatase: 106 U/L (ref 38–126)
Anion gap: 12 (ref 5–15)
BUN: 25 mg/dL — ABNORMAL HIGH (ref 8–23)
CO2: 30 mmol/L (ref 22–32)
Calcium: 8.7 mg/dL — ABNORMAL LOW (ref 8.9–10.3)
Chloride: 95 mmol/L — ABNORMAL LOW (ref 98–111)
Creatinine, Ser: 0.63 mg/dL (ref 0.44–1.00)
GFR, Estimated: >60 mL/min (ref >60)
Glucose, Bld: 126 mg/dL — ABNORMAL HIGH (ref 70–99)
Potassium: 2.7 mmol/L — CL (ref 3.5–5.1)
Sodium: 137 mmol/L (ref 135–145)
Total Bilirubin: 0.4 mg/dL (ref 0.3–1.2)
Total Protein: 6.6 g/dL (ref 6.5–8.1)

## 2022-09-07 LAB — URINALYSIS, ROUTINE W REFLEX MICROSCOPIC
Bilirubin Urine: NEGATIVE
Glucose, UA: NEGATIVE mg/dL
Hgb urine dipstick: NEGATIVE
Ketones, ur: NEGATIVE mg/dL
Leukocytes,Ua: NEGATIVE
Nitrite: NEGATIVE
Protein, ur: NEGATIVE mg/dL
Specific Gravity, Urine: 1.016 (ref 1.005–1.030)
pH: 6 (ref 5.0–8.0)

## 2022-09-07 LAB — CBC
HCT: 30.4 % — ABNORMAL LOW (ref 36.0–46.0)
Hemoglobin: 9.6 g/dL — ABNORMAL LOW (ref 12.0–15.0)
MCH: 28.5 pg (ref 26.0–34.0)
MCHC: 31.6 g/dL (ref 30.0–36.0)
MCV: 90.2 fL (ref 80.0–100.0)
Platelets: 236 10*3/uL (ref 150–400)
RBC: 3.37 MIL/uL — ABNORMAL LOW (ref 3.87–5.11)
RDW: 15.7 % — ABNORMAL HIGH (ref 11.5–15.5)
WBC: 8.6 10*3/uL (ref 4.0–10.5)
nRBC: 0 % (ref 0.0–0.2)

## 2022-09-07 LAB — LIPASE, BLOOD: Lipase: 31 U/L (ref 11–51)

## 2022-09-07 LAB — LACTIC ACID, PLASMA: Lactic Acid, Venous: 1.2 mmol/L (ref 0.5–1.9)

## 2022-09-07 MED ORDER — FENTANYL CITRATE PF 50 MCG/ML IJ SOSY
50.0000 ug | PREFILLED_SYRINGE | Freq: Once | INTRAMUSCULAR | Status: AC
  Administered 2022-09-07: 50 ug via INTRAVENOUS
  Filled 2022-09-07: qty 1

## 2022-09-07 MED ORDER — POTASSIUM CHLORIDE 10 MEQ/100ML IV SOLN
10.0000 meq | Freq: Once | INTRAVENOUS | Status: AC
  Administered 2022-09-07: 10 meq via INTRAVENOUS
  Filled 2022-09-07: qty 100

## 2022-09-07 MED ORDER — ONDANSETRON HCL 4 MG/2ML IJ SOLN: 4.0000 mg | Freq: Four times a day (QID) | INTRAMUSCULAR | Status: DC | PRN

## 2022-09-07 MED ORDER — POLYETHYLENE GLYCOL 3350 17 G PO PACK
17.0000 g | PACK | Freq: Every day | ORAL | Status: DC
  Administered 2022-09-08: 17 g via ORAL
  Filled 2022-09-07: qty 1

## 2022-09-07 MED ORDER — SODIUM CHLORIDE 0.9 % IV BOLUS
500.0000 mL | Freq: Once | INTRAVENOUS | Status: AC
  Administered 2022-09-07: 500 mL via INTRAVENOUS

## 2022-09-07 MED ORDER — ACETAMINOPHEN 650 MG RE SUPP: 650.0000 mg | Freq: Four times a day (QID) | RECTAL | Status: DC | PRN

## 2022-09-07 MED ORDER — MORPHINE SULFATE (PF) 2 MG/ML IV SOLN: 2.0000 mg | INTRAVENOUS | Status: DC | PRN

## 2022-09-07 MED ORDER — ONDANSETRON HCL 4 MG PO TABS: 4.0000 mg | ORAL_TABLET | Freq: Four times a day (QID) | ORAL | Status: DC | PRN

## 2022-09-07 MED ORDER — BISACODYL 5 MG PO TBEC: 5.0000 mg | DELAYED_RELEASE_TABLET | Freq: Every day | ORAL | Status: DC | PRN

## 2022-09-07 MED ORDER — POTASSIUM CHLORIDE CRYS ER 20 MEQ PO TBCR
20.0000 meq | EXTENDED_RELEASE_TABLET | Freq: Once | ORAL | Status: AC
  Administered 2022-09-07: 20 meq via ORAL
  Filled 2022-09-07: qty 1

## 2022-09-07 MED ORDER — ACETAMINOPHEN 325 MG PO TABS: 650.0000 mg | ORAL_TABLET | Freq: Four times a day (QID) | ORAL | Status: DC | PRN

## 2022-09-07 MED ORDER — IOHEXOL 350 MG/ML SOLN
100.0000 mL | Freq: Once | INTRAVENOUS | Status: AC | PRN
  Administered 2022-09-07: 100 mL via INTRAVENOUS

## 2022-09-07 NOTE — Assessment & Plan Note (Signed)
Not acutely exacerbated Continue home inhalers DuoNebs as needed 

## 2022-09-07 NOTE — Assessment & Plan Note (Addendum)
Ventral hernia without obstruction or gangrene Constipation Chronic mesenteric ischemia with history of superior mesenteric embolus Patient presents with abdominal pain without nausea or vomiting, fever or chills, normal WBC and lactic acid.  UA unremarkable Noncontrast CT abdomen and pelvis showing ventral hernia without incarceration or obstruction and fecal retention Suspect abdominal pain has to do with constipation.  Lower suspicion for ischemic bowel in view of normal WBC and lactic acid Low threshold for CTA abdomen and pelvis if pain worsens Trial of laxatives Surgical consult  Addendum: Lactic acid went up to 2 patient had persistent abdominal pain so CTA abdomen and pelvis with contrast ordered as well as IV fluids

## 2022-09-07 NOTE — ED Notes (Signed)
Pt is receiving an IV from IV team due to vein fragility

## 2022-09-07 NOTE — ED Notes (Signed)
This nurse received a call from CT saying that IV has infiltrated. Pt was crying. MD aware. IV consult team order placed. An order for CT w/out contrast placed by MD.

## 2022-09-07 NOTE — Assessment & Plan Note (Signed)
No complaints of chest pain and EKG is nonacute Continue aspirin and atorvastatin.  Holding metoprolol due to hypotension

## 2022-09-07 NOTE — Assessment & Plan Note (Signed)
Etiology uncertain Suspect secondary to antihypertensives and pain meds.  No strong evidence for sepsis at this time IV hydration Continue to monitor Hold antihypertensives

## 2022-09-07 NOTE — Assessment & Plan Note (Signed)
IV repletion given in the ED Monitor and replete as necessary

## 2022-09-07 NOTE — Assessment & Plan Note (Signed)
Continue Celexa

## 2022-09-07 NOTE — Assessment & Plan Note (Signed)
Currently rate controlled Continue apixaban Holding metoprolol due to hypotension

## 2022-09-07 NOTE — ED Notes (Signed)
Request made for transport to the floor ?

## 2022-09-07 NOTE — Assessment & Plan Note (Signed)
PAD s/p left femoral bypass graft Continue aspirin, atorvastatin and apixaban

## 2022-09-07 NOTE — Progress Notes (Signed)
Patient had 90 ml omni 350 extravasation right upper arm dr Cherylann Banas assessed patient ,iv was removed ,arm was elevated,ice was placed at site, verbal instructions were given to the patient ,a verbal handoff was given to nurse snead,sarah Rn,extravasation orders were placed.

## 2022-09-07 NOTE — Assessment & Plan Note (Signed)
Patient is euvolemic to dry Holding all BP lowering meds including furosemide, isosorbide, metolazone and metoprolol Daily weights

## 2022-09-07 NOTE — ED Triage Notes (Signed)
Pt to ED [facility] from [facility] for lower abd pain for 5 days. Denies n/v, fevers.

## 2022-09-07 NOTE — ED Provider Notes (Signed)
[facility] Provider Note    Event Date/Time   First MD Initiated Contact with Patient 09/07/22 1818     (approximate)   History   Abdominal Pain   HPI  Carla [name] is a 61 y.o. female with a history of CAD, peripheral artery disease, chronic mesenteric ischemia, CHF, paroxysmal atrial fibrillation, COPD, hyperlipidemia, iron deficiency anemia, and morbid obesity who presents with right lower abdominal pain for approximate the last 5 days, persistent course but not present all the time.  It is not associated with nausea or vomiting.  The patient has not had any diarrhea or change in her bowel movements.  She denies any fever or chills or any urinary symptoms.  I reviewed the past medical records.  The patient was admitted earlier this month.  Per the hospitalist discharge summary from 11/9 she presented with worsening pain to recent right AKA stump.  A fluid collection was found.  Vascular surgery performed I&D and there was no clear evidence of infection.   Physical Exam   Triage Vital Signs: ED Triage Vitals  Enc Vitals Group     BP 09/07/22 1615 (!) 95/56     Pulse Rate 09/07/22 1615 68     Resp 09/07/22 1615 20     Temp 09/07/22 1615 98.4 F (36.9 C)     Temp src --      SpO2 09/07/22 1615 93 %     Weight 09/07/22 1612 215 lb (97.5 kg)     Height 09/07/22 1612 '5\' 2"'$  (1.575 m)     Head Circumference --      Peak Flow --      Pain Score 09/07/22 1612 10     Pain Loc --      Pain Edu? --      Excl. in [place]? --     Most recent vital signs: Vitals:   09/07/22 2036 09/07/22 2230  BP:  107/83  Pulse: 71 75  Resp: 15 17  Temp:    SpO2: 94% 95%     General: Awake, no distress.  CV:  Good peripheral perfusion.  Resp:  Normal effort.  Abd:  Right lower quadrant hernia with no significant tenderness.  Abdomen soft with mild tenderness to the right lower quadrant inferior to the hernia site.  Mild left-sided tenderness.  Some distention which  the patient states is unchanged from normal.  Other:  No jaundice or scleral icterus.  Moist mucous membranes.   ED Results / Procedures / Treatments   Labs (all labs ordered are listed, but only abnormal results are displayed) Labs Reviewed  COMPREHENSIVE METABOLIC PANEL - Abnormal; Notable for the following components:      Result Value   Potassium 2.7 (*)    Chloride 95 (*)    Glucose, Bld 126 (*)    BUN 25 (*)    Calcium 8.7 (*)    Albumin 2.7 (*)    All other components within normal limits  CBC - Abnormal; Notable for the following components:   RBC 3.37 (*)    Hemoglobin 9.6 (*)    HCT 30.4 (*)    RDW 15.7 (*)    All other components within normal limits  LIPASE, BLOOD  LACTIC ACID, PLASMA  LACTIC ACID, PLASMA  URINALYSIS, ROUTINE W REFLEX MICROSCOPIC     EKG  ED ECG REPORT I, Arta Silence, the attending physician, personally viewed and interpreted this ECG.  Date: 09/07/2022 EKG Time: 1815 Rate: 71  Rhythm: normal sinus rhythm QRS Axis: normal Intervals: normal ST/T Wave abnormalities: normal Narrative Interpretation: no evidence of acute ischemia    RADIOLOGY  CT angio abdomen/pelvis: I independently viewed and interpreted the images; there are no dilated bowel loops or free air or free fluid.  Radiology report indicates significant fecal burden and a right lower quadrant hernia with no evidence of incarceration or strangulation.   PROCEDURES:  Critical Care performed: No  Procedures   MEDICATIONS ORDERED IN ED: Medications  potassium chloride 10 mEq in 100 mL IVPB (10 mEq Intravenous New Bag/Given 09/07/22 2210)  potassium chloride 10 mEq in 100 mL IVPB (0 mEq Intravenous Stopped 09/07/22 2209)  potassium chloride SA (KLOR-CON M) CR tablet 20 mEq (20 mEq Oral Given 09/07/22 2045)  fentaNYL (SUBLIMAZE) injection 50 mcg (50 mcg Intravenous Given 09/07/22 2003)  sodium chloride 0.9 % bolus 500 mL (500 mLs Intravenous New Bag/Given 09/07/22  2116)  iohexol (OMNIPAQUE) 350 MG/ML injection 100 mL (100 mLs Intravenous Contrast Given 09/07/22 1851)     IMPRESSION / MDM / ASSESSMENT AND PLAN / ED COURSE  I reviewed the triage vital signs and the nursing notes.  61 year old female with PMH as noted above presents with right lower quadrant pain for the last several days.  On exam the blood pressure is borderline low but the other vital signs are normal.  The patient is overall relatively well-appearing and has been tolerating p.o.  Initial lab work-up is significant for hypokalemia but no leukocytosis.  The patient is anemic but this is not significant change from baseline.  Differential diagnosis includes, but is not limited to, diverticulitis, colitis, gastroenteritis, volvulus, bowel obstruction, less likely acute exacerbation of mesenteric ischemia or other vascular etiology.  We will obtain labs including lactate, CT angio of the abdomen/pelvis, give analgesia and fluids and reassess.  Patient's presentation is most consistent with acute presentation with potential threat to life or bodily function.  The patient is on the cardiac monitor to evaluate for evidence of arrhythmia and/or significant heart rate changes.  ----------------------------------------- 10:50 PM on 09/07/2022 -----------------------------------------  The patient had difficult IV access and her IV infiltrated twice while attempting to get the CT with contrast.  I then changed the CT to a noncontrast study to not further delay it and to rule out acute findings that might need more urgent treatment.  It shows the hernia with no evidence of obstruction and significant fecal burden which I suspect is likely the main etiology of the patient's pain.  The lactate is normal.  The blood pressure has improved with some fluids.  Clinically based on the duration of the pain for 5 days, the normal lactate, an the lack of vomiting, fever or sepsis, I have an extremely low  suspicion at this time for mesenteric ischemia or other vascular cause of the pain.  Therefore there is no indication to try to attempt the CT angio again at this time.  Given the patient's hypotension and hypokalemia I recommended that she be admitted to the hospital for further treatment and monitoring and she agrees.  I consulted Dr. Damita Dunnings from the hospitalist service; based on our discussion she agrees to admit the patient.   FINAL CLINICAL IMPRESSION(S) / ED DIAGNOSES   Final diagnoses:  Hypokalemia  Right lower quadrant abdominal pain     Rx / DC Orders   ED Discharge Orders     None        Note:  This document was prepared using Dragon  voice recognition software and may include unintentional dictation errors.    Arta Silence, MD 09/07/22 2253

## 2022-09-07 NOTE — H&P (Signed)
History and Physical    Patient: Carla [name] TML:[id number] DOB: October 04, 1961 DOA: 09/07/2022 DOS: the patient was seen and examined on 09/07/2022 PCP: [facility]  Patient coming from: SNF  Chief Complaint:  Chief Complaint  Patient presents with   Abdominal Pain    HPI: Carla [name] is a 61 y.o. female with medical history significant for CAD,PAD status post right AKA complicated by stump abscess s/p treatment ,left axillary femoral bypass graft, diastolic CHF, COPD, chronic mesenteric ischemia, and atrial fibrillation on Eliquis, hypertension, , depression, CAD, obesity, chronic pain, recently hospitalized from 11/4-11/9 for I&D of a left thigh hematoma and discharged to SNF, who was sent in for evaluation of a 5-day history of lower abdominal pain.  Patient denies nausea, vomiting or diarrhea and denies fevers or chills.  She denies chest pain or shortness of breath. ED course and dataReview: BP 95/56 on arrival, fluid responsive to 107/83 by admission.  Vitals otherwise within normal limits.  Labs with normal WBC with lactic acid 1.2.  Potassium 2.7.  Hemoglobin 9.6 which is above baseline 9.8-10.4.  EKG, personally viewed and interpreted showing sinus rhythm at 71 with no ischemic ST-T wave changes.  CT abdomen and pelvis showed a large RLQ ventral hernia with multiple loops of large and small bowel without incarceration or obstruction as well as significant fecal retention throughout the colon consistent with constipation. IMPRESSION: 1. No urinary tract calculi or obstructive uropathy. High attenuation material is seen excreted within the bilateral renal pelves and proximal ureters, please correlate with any recent contrast administration. 2. Large right lower quadrant ventral hernia containing multiple loops of large and small bowel. No incarceration or obstruction. 3. Significant fecal retention throughout the colon consistent with constipation. 4.  Aortic  Atherosclerosis (ICD10-I70.0).   Patient was treated with fentanyl for pain, IV fluid bolus, given IV potassium repletion and hospitalist consulted for admission.   Review of Systems: As mentioned in the history of present illness. All other systems reviewed and are negative.  Past Medical History:  Diagnosis Date   Abdominal aortic atherosclerosis ([place])    a. 05/2017 CTA abd/pelvis: significant atherosclerotic dzs of the infrarenal abd Ao w/ some mural thrombus. No aneurysm or dissection.   Acute focal ischemia of small intestine (HCC)    Acute right-sided low back pain with right-sided sciatica 06/13/2017   AKI (acute kidney injury) ([place]) 07/29/2017   Anemia    Anginal pain ([place])    a. 08/2012 Lexiscan MV: EF 54%, non ischemia/infarct.   Anxiety and depression    Baker's cyst of knee, right    a. 07/2016 U/S: 4.1 x 1.4 x 2.9 cystic structure in R poplitetal fossa.   Bell's palsy    CAD (coronary artery disease)    CHF (congestive heart failure) ([place])    a.) TTE 06/20/2017: EF 60-65%, no rwma, G1DD, no source of cardiac emboli.   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 05/2017 CTA Abd/pelvis: apparent thrombus or embolus in prox SMA (70-90%); b. 05/2017 catheter directed tPA, mechanical thrombectomy, and stenting of the SMA.   Essential hypertension with goal blood pressure less than 140/90 01/19/2016   Gastroesophageal reflux disease 01/19/2016   GERD (gastroesophageal reflux disease)    H/O  colectomy    History of 2019 novel coronavirus disease (COVID-19) 11/14/2019   History of kidney stones    Hyperlipidemia    Hypertension    Morbid obesity with BMI of 40.0-44.9, adult (HCC)    NSTEMI (non-ST elevated myocardial infarction) ([place]) 06/27/2021   a.) high sensitivity  troponins trended: 893 --> 1587 --> 1748 --> 868 --> 735 ng/L   Occlusive mesenteric ischemia (HCC) 06/19/2017   Osteoarthritis    PAD (peripheral artery disease) (HCC)    PAF (paroxysmal atrial fibrillation) (HCC)    a.) CHA2DS2-VASc = 6 (sex, CHF, HTN, DVT x2, aortic plaque). b.) rate/rhythm maintained on oral metoprolol tartrate; chronically anticoagulated with standard dose apixaban.   Palpitations    Peripheral edema    Personal history of other malignant neoplasm of skin 06/01/2016   Pneumonia    Pre-diabetes    Primary osteoarthritis of both knees 06/13/2017   SBO (small bowel obstruction) ([place]) 08/06/2017   Skin cancer of nose    Superior mesenteric artery thrombosis (HCC)    Vitamin D deficiency    Past Surgical History:  Procedure Laterality Date   AMPUTATION Right 04/06/2022   Procedure: AMPUTATION ABOVE KNEE;  Surgeon: Algernon Huxley, MD;  Location: [facility]S;  Service: Vascular;  Laterality: Right;   AMPUTATION TOE Left 12/09/2019   Procedure: AMPUTATION TOE MPJ left;  Surgeon: Caroline More, DPM;  Location: [facility];  Service: Podiatry;  Laterality: Left;   APPENDECTOMY     APPLICATION OF WOUND VAC  12/01/2021   Procedure: APPLICATION OF WOUND VAC;  Surgeon: Algernon Huxley, MD;  Location: [facility];  Service: Vascular;;   APPLICATION OF WOUND VAC Right 05/03/2022   Procedure: APPLICATION OF WOUND VAC;  Surgeon: Algernon Huxley, MD;  Location: [facility];  Service: Vascular;  Laterality: Right;   APPLICATION OF WOUND VAC Left 07/23/2022   Procedure: APPLICATION OF WOUND VAC;  Surgeon: Serafina Mitchell, MD;  Location: [facility];  Service: Vascular;  Laterality: Left;  [place]   APPLICATION OF WOUND VAC Right 08/28/2022   Procedure: APPLICATION OF WOUND VAC;  Surgeon: Algernon Huxley, MD;  Location: [facility];  Service: Vascular;  Laterality: Right;   AXILLARY-FEMORAL BYPASS GRAFT Left 07/23/2022   Procedure: BYPASS GRAFT AXILLA-BIFEMORAL;  Surgeon: Serafina Mitchell, MD;  Location: [facility];   Service: Vascular;  Laterality: Left;   CHOLECYSTECTOMY     COLON SURGERY     COLONOSCOPY WITH PROPOFOL N/A 03/23/2022   Procedure: COLONOSCOPY WITH PROPOFOL;  Surgeon: Jonathon Bellows, MD;  Location: [facility];  Service: Gastroenterology;  Laterality: N/A;  rectal bleed   EMBOLECTOMY OR THROMBECTOMY, WITH OR WITHOUT CATHETER; FEMOROPOPLITEAL, AORTOILIAC ARTERY, BY LEG INCISION N/A 06/21/2020   Left - Decomp Fasciotomy Leg; Ant &/Or Lat Compart Only; Midline - Angiography, Visceral, Selective Or Supraselective (With Or Without Flush Aortogram); Left - Revascularize, Endovasc, Open/Percut, Iliac Artery, Unilat, Initial Vessel; W/Translum Stent, W/Angioplasty; Right - Revascularize, Endovasc, Open/Percut, Iliac Artery, Ea Add`L Ipsilateral; W/Translumin Stent, W/Angioplasty; Location [facility];   ENDARTERECTOMY FEMORAL Left 12/01/2021   Procedure: ENDARTERECTOMY FEMORAL;  Surgeon: Algernon Huxley, MD;  Location: [facility];  Service: Vascular;  Laterality: Left;   ENDOVASCULAR REPAIR/STENT GRAFT Left 12/01/2021   Procedure: ENDOVASCULAR REPAIR/STENT GRAFT;  Surgeon: Algernon Huxley, MD;  Location: [place] [facility];  Service: Cardiovascular;  Laterality: Left;   INCISION AND DRAINAGE OF WOUND Right 05/03/2022   Procedure: IRRIGATION AND DEBRIDEMENT RIGHT STUMP;  Surgeon: Algernon Huxley, MD;  Location: [facility];  Service: Vascular;  Laterality: Right;   IRRIGATION AND DEBRIDEMENT ABSCESS Right 08/28/2022   Procedure: AKA IRRIGATION AND DEBRIDEMENT ABSCESS;  Surgeon: Algernon Huxley, MD;  Location: [facility];  Service: Vascular;  Laterality: Right;   LAPAROTOMY N/A 08/08/2017   Procedure: EXPLORATORY LAPAROTOMY POSSIBLE BOWEL RESECTION;  Surgeon: Jules Husbands, MD;  Location: [facility];  Service: General;  Laterality: N/A;   LEFT HEART CATH AND CORONARY ANGIOGRAPHY N/A 05/17/2021   Procedure: LEFT HEART CATH AND CORONARY ANGIOGRAPHY;  Surgeon: Yolonda Kida, MD;  Location: [place] [facility];  Service: Cardiovascular;   Laterality: N/A;   LOWER EXTREMITY ANGIOGRAPHY Left 11/17/2019   Procedure: LOWER EXTREMITY ANGIOGRAPHY;  Surgeon: Algernon Huxley, MD;  Location: [place] [facility];  Service: Cardiovascular;  Laterality: Left;   LOWER EXTREMITY ANGIOGRAPHY Right 12/13/2020   Procedure: LOWER EXTREMITY ANGIOGRAPHY;  Surgeon: Algernon Huxley, MD;  Location: [place] [facility];  Service: Cardiovascular;  Laterality: Right;   LOWER EXTREMITY ANGIOGRAPHY Right 01/10/2021   Procedure: LOWER EXTREMITY ANGIOGRAPHY;  Surgeon: Algernon Huxley, MD;  Location: [place] [facility];  Service: Cardiovascular;  Laterality: Right;   LOWER EXTREMITY ANGIOGRAPHY Right 01/24/2021   Procedure: LOWER EXTREMITY ANGIOGRAPHY;  Surgeon: Algernon Huxley, MD;  Location: [place] [facility];  Service: Cardiovascular;  Laterality: Right;   LOWER EXTREMITY ANGIOGRAPHY Bilateral 06/28/2021   Procedure: Lower Extremity Angiography;  Surgeon: Algernon Huxley, MD;  Location: [place] [facility];  Service: Cardiovascular;  Laterality: Bilateral;   LOWER EXTREMITY ANGIOGRAPHY Right 11/28/2021   Procedure: LOWER EXTREMITY ANGIOGRAPHY;  Surgeon: Algernon Huxley, MD;  Location: [place] [facility];  Service: Cardiovascular;  Laterality: Right;   LOWER EXTREMITY ANGIOGRAPHY Left 11/30/2021   Procedure: Lower Extremity Angiography;  Surgeon: Algernon Huxley, MD;  Location: [place] [facility];  Service: Cardiovascular;  Laterality: Left;   LOWER EXTREMITY ANGIOGRAPHY Right 02/22/2022   Procedure: Lower Extremity Angiography;  Surgeon: Algernon Huxley, MD;  Location: [place] [facility];  Service: Cardiovascular;  Laterality: Right;   LOWER EXTREMITY ANGIOGRAPHY Right 03/13/2022   Procedure: Lower Extremity Angiography;  Surgeon: Algernon Huxley, MD;  Location: [place] [facility];  Service: Cardiovascular;  Laterality: Right;   LOWER EXTREMITY ANGIOGRAPHY Right 03/24/2022   Procedure: Lower Extremity Angiography;  Surgeon: Algernon Huxley, MD;  Location: [place] [facility];   Service: Cardiovascular;  Laterality: Right;   LOWER EXTREMITY INTERVENTION N/A 11/19/2019   Procedure: LOWER EXTREMITY INTERVENTION;  Surgeon: Algernon Huxley, MD;  Location: [place] [facility];  Service: Cardiovascular;  Laterality: N/A;   LOWER EXTREMITY INTERVENTION Bilateral 06/29/2021   Procedure: LOWER EXTREMITY INTERVENTION;  Surgeon: Algernon Huxley, MD;  Location: [place] [facility];  Service: Cardiovascular;  Laterality: Bilateral;   TEE WITHOUT CARDIOVERSION N/A 06/22/2017   Procedure: TRANSESOPHAGEAL ECHOCARDIOGRAM (TEE);  Surgeon: Wellington Hampshire, MD;  Location: [facility];  Service: Cardiovascular;  Laterality: N/A;   TOTAL KNEE ARTHROPLASTY Right 07/11/2018   Procedure: TOTAL KNEE ARTHROPLASTY;  Surgeon: Corky Mull, MD;  Location: [facility];  Service: Orthopedics;  Laterality: Right;   VAGINAL HYSTERECTOMY     VISCERAL ARTERY INTERVENTION N/A 06/20/2017   Procedure: Visceral Artery Intervention, possible aortic thrombectomy;  Surgeon: Algernon Huxley, MD;  Location: [place] [facility];  Service: Cardiovascular;  Laterality: N/A;   VISCERAL ARTERY INTERVENTION N/A 01/28/2018   Procedure: VISCERAL ARTERY INTERVENTION;  Surgeon: Algernon Huxley, MD;  Location: [place]  [facility];  Service: Cardiovascular;  Laterality: N/A;   Social History:  reports that she quit smoking about 10 months ago. Her smoking use included cigarettes. She smoked an average of 1 pack per day. She has never used smokeless tobacco. She reports that she does not drink alcohol and does not use drugs.  Allergies  Allergen Reactions   Gabapentin     Tremors    Bactrim [Sulfamethoxazole-Trimethoprim] Itching    Family History  Problem Relation Age of Onset   Hypertension Mother    Heart disease Mother    Heart attack Mother    Breast cancer Mother    Hypertension Father    Breast cancer Paternal Aunt     Prior to Admission medications   Medication Sig Start Date End Date Taking? Authorizing Provider   apixaban (ELIQUIS) 5 MG TABS tablet Take 1 tablet (5 mg total) by mouth 2 (two) times daily. 01/25/21  Yes Stegmayer, Janalyn Harder, PA-C  aspirin EC 81 MG tablet Take 81 mg by mouth daily. Swallow whole.   Yes [provider]  atorvastatin (LIPITOR) 80 MG tablet Take 80 mg by mouth at bedtime.   Yes [provider]  cefTRIAXone (ROCEPHIN) 1 g injection Inject 1 g into the muscle daily. 09/06/22  Yes [provider]  citalopram (CELEXA) 40 MG tablet Take 1 tablet (40 mg total) by mouth daily. 02/04/22 09/07/22 Yes Enzo Bi, MD  esomeprazole (NEXIUM) 20 MG capsule Take 20 mg by mouth daily. 07/06/22  Yes [provider]  feeding supplement (ENSURE ENLIVE / ENSURE PLUS) LIQD Take 237 mLs by mouth 2 (two) times daily between meals. 05/06/22  Yes Fritzi Mandes, MD  ferrous sulfate 325 (65 FE) MG tablet Take 325 mg by mouth daily.   Yes [provider]  furosemide (LASIX) 40 MG tablet Take 0.5 tablets (20 mg total) by mouth daily. 08/11/22  Yes Lorella Nimrod, MD  isosorbide mononitrate (IMDUR) 60 MG 24 hr tablet Take 60 mg by mouth daily. 01/23/22  Yes [provider]  lidocaine (LIDODERM) 5 % Place 1 patch onto the skin daily. Remove & Discard patch within 12 hours or as directed by MD 08/11/22  Yes Lorella Nimrod, MD  metolazone (ZAROXOLYN) 2.5 MG tablet Take 2.5 mg by mouth daily.   Yes [provider]  metoprolol tartrate (LOPRESSOR) 25 MG tablet Take 0.5 tablets (12.5 mg total) by mouth 2 (two) times daily. Patient taking differently: Take 12.5 mg by mouth 2 (two) times daily. Hold if SBP is 100 or less or HR is less than 60. Notify Provider 08/11/22  Yes Lorella Nimrod, MD  morphine (MS CONTIN) 15 MG 12 hr tablet Take 1 tablet (15 mg total) by mouth every 12 (twelve) hours. 08/31/22  Yes Richarda Osmond, MD  Multiple Vitamin (MULTIVITAMIN WITH MINERALS) TABS tablet Take 1 tablet by mouth daily. 03/25/22  Yes Fritzi Mandes, MD  polyethylene glycol  (MIRALAX / GLYCOLAX) 17 g packet Take 17 g by mouth daily. 08/12/22  Yes Lorella Nimrod, MD  pregabalin (LYRICA) 200 MG capsule Take 200 mg by mouth in the morning, at noon, and at bedtime.   Yes [provider]  alum & mag hydroxide-simeth (MAALOX/MYLANTA) 200-200-20 MG/5ML suspension Take 15-30 mLs by mouth every 2 (two) hours as needed for indigestion. 08/11/22   Lorella Nimrod, MD  COMBIVENT RESPIMAT 20-100 MCG/ACT AERS respimat Inhale 1 puff into the lungs every 6 (six) hours as needed. 03/27/22   [provider]  HYDROcodone-acetaminophen (  NORCO/VICODIN) 5-325 MG tablet Take 1-2 tablets by mouth every 4 (four) hours as needed for severe pain or moderate pain (1 pill for moderate pain, 2 for severe). 08/31/22   Richarda Osmond, MD  ondansetron (ZOFRAN) 4 MG tablet Take 4 mg by mouth every 8 (eight) hours as needed for nausea or vomiting.    [provider]    Physical Exam: Vitals:   09/07/22 1615 09/07/22 2004 09/07/22 2036 09/07/22 2230  BP: (!) 95/56   107/83  Pulse: 68 76 71 75  Resp: '20 16 15 17  '$ Temp: 98.4 F (36.9 C)     SpO2: 93% 95% 94% 95%  Weight:      Height:       Physical Exam Vitals and nursing note reviewed.  Constitutional:      General: She is not in acute distress. HENT:     Head: Normocephalic and atraumatic.  Cardiovascular:     Rate and Rhythm: Normal rate and regular rhythm.     Heart sounds: Normal heart sounds.  Pulmonary:     Effort: Pulmonary effort is normal.     Breath sounds: Normal breath sounds.  Abdominal:     Palpations: Abdomen is soft.     Tenderness: There is abdominal tenderness in the suprapubic area.     Comments: Large ventral wall hernia in RLQ  Neurological:     Mental Status: Mental status is at baseline.     Labs on Admission: I have personally reviewed following labs and imaging studies  CBC: Recent Labs  Lab 09/07/22 1621  WBC 8.6  HGB 9.6*  HCT 30.4*  MCV 90.2  PLT 846   Basic Metabolic  Panel: Recent Labs  Lab 09/07/22 1621  NA 137  K 2.7*  CL 95*  CO2 30  GLUCOSE 126*  BUN 25*  CREATININE 0.63  CALCIUM 8.7*   GFR: Estimated Creatinine Clearance: 79.5 mL/min (by C-G formula based on SCr of 0.63 mg/dL). Liver Function Tests: Recent Labs  Lab 09/07/22 1621  AST 20  ALT 14  ALKPHOS 106  BILITOT 0.4  PROT 6.6  ALBUMIN 2.7*   Recent Labs  Lab 09/07/22 1621  LIPASE 31   No results for input(s): "AMMONIA" in the last 168 hours. Coagulation Profile: No results for input(s): "INR", "PROTIME" in the last 168 hours. Cardiac Enzymes: No results for input(s): "CKTOTAL", "CKMB", "CKMBINDEX", "TROPONINI" in the last 168 hours. BNP (last 3 results) No results for input(s): "PROBNP" in the last 8760 hours. HbA1C: No results for input(s): "HGBA1C" in the last 72 hours. CBG: No results for input(s): "GLUCAP" in the last 168 hours. Lipid Profile: No results for input(s): "CHOL", "HDL", "LDLCALC", "TRIG", "CHOLHDL", "LDLDIRECT" in the last 72 hours. Thyroid Function Tests: No results for input(s): "TSH", "T4TOTAL", "FREET4", "T3FREE", "THYROIDAB" in the last 72 hours. Anemia Panel: No results for input(s): "VITAMINB12", "FOLATE", "FERRITIN", "TIBC", "IRON", "RETICCTPCT" in the last 72 hours. Urine analysis:    Component Value Date/Time   COLORURINE YELLOW (A) 09/07/2022 2243   APPEARANCEUR CLEAR (A) 09/07/2022 2243   APPEARANCEUR Clear 01/19/2015 1112   LABSPEC 1.016 09/07/2022 2243   LABSPEC 1.019 01/19/2015 1112   PHURINE 6.0 09/07/2022 [place] 09/07/2022 2243   GLUCOSEU Negative 01/19/2015 1112   HGBUR NEGATIVE 09/07/2022 2243   BILIRUBINUR NEGATIVE 09/07/2022 2243   BILIRUBINUR Negative 01/19/2015 [place] 09/07/2022 [place] NEGATIVE 09/07/2022 2243   NITRITE NEGATIVE 09/07/2022 2243  LEUKOCYTESUR NEGATIVE 09/07/2022 2243   LEUKOCYTESUR Negative 01/19/2015 1112    Radiological Exams on Admission: CT  ABDOMEN PELVIS WO CONTRAST  Result Date: 09/07/2022 CLINICAL DATA:  Right lower quadrant abdominal pain for 5 days EXAM: CT ABDOMEN AND PELVIS WITHOUT CONTRAST TECHNIQUE: Multidetector CT imaging of the abdomen and pelvis was performed following the standard protocol without IV contrast. Unenhanced CT was performed per clinician order. Lack of IV contrast limits sensitivity and specificity, especially for evaluation of abdominal/pelvic solid viscera. RADIATION DOSE REDUCTION: This exam was performed according to the departmental dose-optimization program which includes automated exposure control, adjustment of the mA and/or kV according to patient size and/or use of iterative reconstruction technique. COMPARISON:  03/21/2022 FINDINGS: Lower chest: No acute pleural or parenchymal lung disease. Hepatobiliary: Cholecystectomy. Unremarkable unenhanced appearance of the liver. No biliary duct dilation. Pancreas: Unremarkable unenhanced appearance. Spleen: Unremarkable unenhanced appearance. Adrenals/Urinary Tract: Stable benign left adrenal adenoma. The right adrenals unremarkable. No urinary tract calculi or obstructive uropathy within either kidney. There is high attenuation material seen within the bilateral renal pelves, please correlate with any recent contrasted procedure. Bladder is unremarkable. Stomach/Bowel: No bowel obstruction or ileus. There is a large amount of retained stool throughout the colon consistent with constipation. No bowel wall thickening or inflammatory change. Vascular/Lymphatic: Stable atherosclerosis of the abdominal aorta. Stents are seen within the SMA and bilateral common iliac arteries. Evaluation of the vascular lumen is limited without IV contrast. No pathologic adenopathy within the abdomen or pelvis. Reproductive: Status post hysterectomy. No adnexal masses. Other: No free fluid or free intraperitoneal gas. Large right lower quadrant ventral hernia again identified, containing a  significant portion of distal small bowel and proximal colon. No evidence of incarceration or obstruction. Musculoskeletal: No acute or destructive bony lesions. Reconstructed images demonstrate no additional findings. IMPRESSION: 1. No urinary tract calculi or obstructive uropathy. High attenuation material is seen excreted within the bilateral renal pelves and proximal ureters, please correlate with any recent contrast administration. 2. Large right lower quadrant ventral hernia containing multiple loops of large and small bowel. No incarceration or obstruction. 3. Significant fecal retention throughout the colon consistent with constipation. 4.  Aortic Atherosclerosis (ICD10-I70.0). Electronically Signed   By: Randa Ngo M.D.   On: 09/07/2022 20:46     Data Reviewed: Relevant notes from primary care and specialist visits, past discharge summaries as available in EHR, including Care Everywhere. Prior diagnostic testing as pertinent to current admission diagnoses Updated medications and problem lists for reconciliation ED course, including vitals, labs, imaging, treatment and response to treatment Triage notes, nursing and pharmacy notes and ED provider's notes Notable results as noted in HPI   Assessment and Plan: * Hypotension Etiology uncertain Suspect secondary to antihypertensives and pain meds.  No strong evidence for sepsis at this time IV hydration Continue to monitor Hold antihypertensives  Abdominal pain Ventral hernia without obstruction or gangrene Constipation Chronic mesenteric ischemia with history of superior mesenteric embolus Patient presents with abdominal pain without nausea or vomiting, fever or chills, normal WBC and lactic acid.  UA unremarkable Noncontrast CT abdomen and pelvis showing ventral hernia without incarceration or obstruction and fecal retention Suspect abdominal pain has to do with constipation.  Lower suspicion for ischemic bowel in view of normal  WBC and lactic acid Low threshold for CTA abdomen and pelvis if pain worsens Trial of laxatives Surgical consult  Addendum: Lactic acid went up to 2 patient had persistent abdominal pain so CTA abdomen and pelvis with contrast  ordered as well as IV fluids  Hypokalemia IV repletion given in the ED Monitor and replete as necessary  CAD (coronary artery disease) No complaints of chest pain and EKG is nonacute Continue aspirin and atorvastatin.  Holding metoprolol due to hypotension  Chronic diastolic CHF (congestive heart failure) ([place]) Patient is euvolemic to dry Holding all BP lowering meds including furosemide, isosorbide, metolazone and metoprolol Daily weights  PAF (paroxysmal atrial fibrillation) (HCC) Currently rate controlled Continue apixaban Holding metoprolol due to hypotension  COPD (chronic obstructive pulmonary disease) ([place]) Not acutely exacerbated Continue home inhalers.  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 Observation status is judged to be reasonable and necessary in order to provide the required intensity of service to ensure the patient's safety. The patient's presenting symptoms, physical exam findings, and initial radiographic and laboratory data in the context of their medical condition is felt to place them at decreased risk for further clinical deterioration. Furthermore, it is anticipated that the patient will be medically stable for discharge from the hospital within 2 midnights of admission.   Author: Athena Masse,  MD 09/07/2022 10:58 PM  For on call review [email or web address].

## 2022-09-08 ENCOUNTER — Observation Stay: Payer: Medicare Other

## 2022-09-08 ENCOUNTER — Encounter: Payer: Self-pay | Admitting: Internal Medicine

## 2022-09-08 DIAGNOSIS — I9589 Other hypotension: Secondary | ICD-10-CM

## 2022-09-08 DIAGNOSIS — Z89611 Acquired absence of right leg above knee: Secondary | ICD-10-CM

## 2022-09-08 DIAGNOSIS — J449 Chronic obstructive pulmonary disease, unspecified: Secondary | ICD-10-CM

## 2022-09-08 DIAGNOSIS — Z7901 Long term (current) use of anticoagulants: Secondary | ICD-10-CM

## 2022-09-08 DIAGNOSIS — F419 Anxiety disorder, unspecified: Secondary | ICD-10-CM

## 2022-09-08 DIAGNOSIS — E876 Hypokalemia: Secondary | ICD-10-CM

## 2022-09-08 DIAGNOSIS — K5900 Constipation, unspecified: Secondary | ICD-10-CM

## 2022-09-08 DIAGNOSIS — I2510 Atherosclerotic heart disease of native coronary artery without angina pectoris: Secondary | ICD-10-CM

## 2022-09-08 DIAGNOSIS — F32A Depression, unspecified: Secondary | ICD-10-CM

## 2022-09-08 DIAGNOSIS — I480 Paroxysmal atrial fibrillation: Secondary | ICD-10-CM

## 2022-09-08 DIAGNOSIS — R1084 Generalized abdominal pain: Secondary | ICD-10-CM

## 2022-09-08 DIAGNOSIS — K436 Other and unspecified ventral hernia with obstruction, without gangrene: Secondary | ICD-10-CM

## 2022-09-08 DIAGNOSIS — I10 Essential (primary) hypertension: Secondary | ICD-10-CM

## 2022-09-08 DIAGNOSIS — I959 Hypotension, unspecified: Secondary | ICD-10-CM | POA: Diagnosis not present

## 2022-09-08 DIAGNOSIS — K551 Chronic vascular disorders of intestine: Secondary | ICD-10-CM

## 2022-09-08 DIAGNOSIS — I5032 Chronic diastolic (congestive) heart failure: Secondary | ICD-10-CM

## 2022-09-08 DIAGNOSIS — I739 Peripheral vascular disease, unspecified: Secondary | ICD-10-CM

## 2022-09-08 LAB — CBC
HCT: 29.7 % — ABNORMAL LOW (ref 36.0–46.0)
Hemoglobin: 9.5 g/dL — ABNORMAL LOW (ref 12.0–15.0)
MCH: 29.1 pg (ref 26.0–34.0)
MCHC: 32 g/dL (ref 30.0–36.0)
MCV: 90.8 fL (ref 80.0–100.0)
Platelets: 238 10*3/uL (ref 150–400)
RBC: 3.27 MIL/uL — ABNORMAL LOW (ref 3.87–5.11)
RDW: 15.7 % — ABNORMAL HIGH (ref 11.5–15.5)
WBC: 7.5 10*3/uL (ref 4.0–10.5)
nRBC: 0 % (ref 0.0–0.2)

## 2022-09-08 LAB — COMPREHENSIVE METABOLIC PANEL
ALT: 16 U/L (ref 0–44)
AST: 15 U/L (ref 15–41)
Albumin: 2.5 g/dL — ABNORMAL LOW (ref 3.5–5.0)
Alkaline Phosphatase: 111 U/L (ref 38–126)
Anion gap: 7 (ref 5–15)
BUN: 20 mg/dL (ref 8–23)
CO2: 33 mmol/L — ABNORMAL HIGH (ref 22–32)
Calcium: 8.6 mg/dL — ABNORMAL LOW (ref 8.9–10.3)
Chloride: 98 mmol/L (ref 98–111)
Creatinine, Ser: 0.61 mg/dL (ref 0.44–1.00)
GFR, Estimated: >60 mL/min (ref >60)
Glucose, Bld: 106 mg/dL — ABNORMAL HIGH (ref 70–99)
Potassium: 3.1 mmol/L — ABNORMAL LOW (ref 3.5–5.1)
Sodium: 138 mmol/L (ref 135–145)
Total Bilirubin: 0.5 mg/dL (ref 0.3–1.2)
Total Protein: 6.4 g/dL — ABNORMAL LOW (ref 6.5–8.1)

## 2022-09-08 LAB — LACTIC ACID, PLASMA: Lactic Acid, Venous: 2 mmol/L (ref 0.5–1.9)

## 2022-09-08 LAB — POTASSIUM: Potassium: 3.2 mmol/L — ABNORMAL LOW (ref 3.5–5.1)

## 2022-09-08 MED ORDER — APIXABAN 5 MG PO TABS
5.0000 mg | ORAL_TABLET | Freq: Two times a day (BID) | ORAL | Status: DC
  Administered 2022-09-08: 5 mg via ORAL
  Filled 2022-09-08: qty 1

## 2022-09-08 MED ORDER — IPRATROPIUM-ALBUTEROL 20-100 MCG/ACT IN AERS: 1.0000 | INHALATION_SPRAY | Freq: Four times a day (QID) | RESPIRATORY_TRACT | Status: DC | PRN

## 2022-09-08 MED ORDER — ENSURE ENLIVE PO LIQD
237.0000 mL | Freq: Two times a day (BID) | ORAL | Status: DC
  Administered 2022-09-08: 237 mL via ORAL

## 2022-09-08 MED ORDER — NALOXEGOL OXALATE 12.5 MG PO TABS: 12.5000 mg | ORAL_TABLET | Freq: Every day | ORAL | 0 refills | Status: DC

## 2022-09-08 MED ORDER — LIDOCAINE 5 % EX PTCH
1.0000 | MEDICATED_PATCH | CUTANEOUS | Status: DC
  Administered 2022-09-08: 1 via TRANSDERMAL
  Filled 2022-09-08: qty 1

## 2022-09-08 MED ORDER — METOPROLOL TARTRATE 25 MG PO TABS: 12.5000 mg | ORAL_TABLET | Freq: Two times a day (BID) | ORAL | Status: DC

## 2022-09-08 MED ORDER — ASPIRIN 81 MG PO TBEC
81.0000 mg | DELAYED_RELEASE_TABLET | Freq: Every day | ORAL | Status: DC
  Administered 2022-09-08: 81 mg via ORAL
  Filled 2022-09-08: qty 1

## 2022-09-08 MED ORDER — INFLUENZA VAC SPLIT QUAD 0.5 ML IM SUSY: 0.5000 mL | PREFILLED_SYRINGE | INTRAMUSCULAR | Status: DC

## 2022-09-08 MED ORDER — ATORVASTATIN CALCIUM 20 MG PO TABS
80.0000 mg | ORAL_TABLET | Freq: Every day | ORAL | Status: DC
  Administered 2022-09-08: 80 mg via ORAL
  Filled 2022-09-08: qty 4

## 2022-09-08 MED ORDER — POTASSIUM CHLORIDE CRYS ER 20 MEQ PO TBCR
40.0000 meq | EXTENDED_RELEASE_TABLET | Freq: Two times a day (BID) | ORAL | Status: DC
  Administered 2022-09-08: 40 meq via ORAL
  Filled 2022-09-08: qty 2

## 2022-09-08 MED ORDER — HYDROCODONE-ACETAMINOPHEN 5-325 MG PO TABS: 1.0000 | ORAL_TABLET | ORAL | 0 refills | Status: DC | PRN

## 2022-09-08 MED ORDER — NALOXEGOL OXALATE 25 MG PO TABS
25.0000 mg | ORAL_TABLET | Freq: Every day | ORAL | Status: DC
  Administered 2022-09-08: 25 mg via ORAL
  Filled 2022-09-08: qty 1

## 2022-09-08 MED ORDER — ALUM & MAG HYDROXIDE-SIMETH 200-200-20 MG/5ML PO SUSP: 15.0000 mL | ORAL | Status: DC | PRN

## 2022-09-08 MED ORDER — POLYETHYLENE GLYCOL 3350 17 G PO PACK
17.0000 g | PACK | Freq: Every day | ORAL | Status: DC
  Administered 2022-09-08: 17 g via ORAL
  Filled 2022-09-08: qty 1

## 2022-09-08 MED ORDER — BISACODYL 5 MG PO TBEC: 5.0000 mg | DELAYED_RELEASE_TABLET | Freq: Every day | ORAL | 0 refills | Status: DC | PRN

## 2022-09-08 MED ORDER — PANTOPRAZOLE SODIUM 40 MG PO TBEC
40.0000 mg | DELAYED_RELEASE_TABLET | Freq: Every day | ORAL | Status: DC
  Administered 2022-09-08: 40 mg via ORAL
  Filled 2022-09-08: qty 1

## 2022-09-08 MED ORDER — HYDROCODONE-ACETAMINOPHEN 5-325 MG PO TABS
1.0000 | ORAL_TABLET | ORAL | Status: DC | PRN
  Administered 2022-09-08 (×2): 2 via ORAL
  Administered 2022-09-08: 1 via ORAL
  Filled 2022-09-08 (×2): qty 1
  Filled 2022-09-08 (×2): qty 2

## 2022-09-08 MED ORDER — CITALOPRAM HYDROBROMIDE 10 MG PO TABS
40.0000 mg | ORAL_TABLET | Freq: Every day | ORAL | Status: DC
  Administered 2022-09-08: 40 mg via ORAL
  Filled 2022-09-08: qty 4

## 2022-09-08 MED ORDER — FERROUS SULFATE 325 (65 FE) MG PO TABS
325.0000 mg | ORAL_TABLET | Freq: Every day | ORAL | Status: DC
  Administered 2022-09-08: 325 mg via ORAL
  Filled 2022-09-08: qty 1

## 2022-09-08 MED ORDER — SODIUM CHLORIDE 0.9 % IV BOLUS
500.0000 mL | Freq: Once | INTRAVENOUS | Status: AC
  Administered 2022-09-08: 500 mL via INTRAVENOUS

## 2022-09-08 MED ORDER — MORPHINE SULFATE ER 15 MG PO TBCR: 15.0000 mg | EXTENDED_RELEASE_TABLET | Freq: Two times a day (BID) | ORAL | 0 refills | Status: DC

## 2022-09-08 MED ORDER — ADULT MULTIVITAMIN W/MINERALS CH
1.0000 | ORAL_TABLET | Freq: Every day | ORAL | Status: DC
  Administered 2022-09-08: 1 via ORAL
  Filled 2022-09-08: qty 1

## 2022-09-08 MED ORDER — MORPHINE SULFATE ER 15 MG PO TBCR
15.0000 mg | EXTENDED_RELEASE_TABLET | Freq: Two times a day (BID) | ORAL | Status: DC
  Administered 2022-09-08 (×2): 15 mg via ORAL
  Filled 2022-09-08 (×2): qty 1

## 2022-09-08 NOTE — TOC Transition Note (Addendum)
Transition of Care [facility]) - CM/SW Discharge Note   Patient Details  Name: NINAH MOCCIO MRN: [id number] Date of Birth: 1961-02-06  Transition of Care [facility]) CM/SW Contact:  Laurena Slimmer, RN Phone Number: 09/08/2022, 1:43 PM   Clinical Narrative:    Discharge summary sent in [facility] packet arranged. EMS arranged.  Nurse provided phone number to call report [phone]  Patient's daughter [name] notified.  Confirmed discharge summary was received by RIcky  TOC signing off.    Final next level of care: Skilled Nursing Facility Barriers to Discharge: Barriers Resolved   Patient Goals and CMS Choice Patient states their goals for this hospitalization and ongoing recovery are:: SNF- [facility]      Discharge Placement              Patient chooses bed at: Other - please specify in the comment section below: ([facility]) Patient to be transferred to facility by: [facility]   Patient and family notified of of transfer: 09/08/22  Discharge Plan and Services                                     Social Determinants of Health (SDOH) Interventions     Readmission Risk Interventions    07/25/2022   10:49 AM 05/05/2022   10:52 AM 03/24/2022    9:51 AM  Readmission Risk Prevention Plan  Transportation Screening Complete Complete Complete  PCP or Specialist Appt within 3-5 Days   Complete  HRI or [place]   Complete  Social Work Consult for [place] Planning/Counseling   Complete  Palliative Care Screening   Not Applicable  Medication Review [employer]) Complete Complete Complete  PCP or Specialist appointment within 3-5 days of discharge Complete Complete   HRI or Home Care Consult Complete    SW Recovery Care/Counseling Consult Not Complete Complete   SW Consult Not Complete Comments NA    Palliative Care Screening  Not [place] Patient Refused Patient Refused

## 2022-09-08 NOTE — Consult Note (Signed)
WOC consulted for surgical wound related to recent I&D per vascular s/p AKA.  I have asked hospitalist to consult vascular for that reason, to make them aware of admission and have them decide on further POC for this wound.   Will follow up later today on any further needs.   Re consult if needed, will not follow at this time. Thanks  Debanhi Blaker [employer], RN,CWOCN, CNS, [place] [phone])

## 2022-09-08 NOTE — Plan of Care (Signed)

## 2022-09-08 NOTE — Progress Notes (Signed)
Patient is being discharged back to [facility]. Report was called to [facility]. She was able to ask questions, and questions were answered appropriately.  Will cont to cont to monitor.

## 2022-09-08 NOTE — ED Notes (Addendum)
Pt said she had BKA in June. Stump has giant band aid on end. Pt has bruising and swelling in different areas on stomach, but doesn't indicate that these are the areas causing pain. The pain is is generalized across lower abd and there is a large lump on right hand side of pt's abd.

## 2022-09-08 NOTE — ED Notes (Signed)
[facility] was called to let them know what is happening with the pt. Nurse at [facility] had called earlier in the night, but information was not available until this time.

## 2022-09-08 NOTE — Discharge Summary (Addendum)
Physician Discharge Summary   Patient: Carla [name] MRN: [id number]  DOB: 06-May-1961   Admit:     Date of Admission for Observation: 09/07/2022 Brought from: SNF   Discharge: Date of discharge: 09/08/22 Disposition: Skilled nursing facility Condition at discharge: fair  CODE STATUS: FULL CODE      Discharge Physician: Emeterio Reeve, DO [facility]     PCP: [facility]  Recommendations for Outpatient Follow-up:  Follow up with PCP [facility] in 1-2 weeks to maintain pain Rx and movantik  Follow as directed w/ vascular surgery team Hold BP medications (metoprolol, furosemide) for SBP <130, DBP <80.    Discharge Instructions     Diet - low sodium heart healthy   Complete by: As directed    Discharge wound care:   Complete by: As directed    Buttocks Left Stage 2 -  Partial thickness loss of dermis presenting as a shallow open injury with a red, pink wound bed without slough.  Wound vac to remain in place total 2-3 weeks per vascular surgery team   Increase activity slowly   Complete by: As directed          Discharge Diagnoses: Principal Problem:   Hypotension Active Problems:   Abdominal pain   Hypokalemia   Chronic mesenteric ischemia (HCC)   Chronic anticoagulation   PAD (peripheral artery disease) (HCC)   CAD (coronary artery disease)   Chronic diastolic CHF (congestive heart failure) (HCC)   PAF (paroxysmal atrial fibrillation) (HCC)   COPD (chronic obstructive pulmonary disease) ([place])   Essential hypertension   Ventral hernia with obstruction and without gangrene   Anxiety and depression   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 [facility])       Hospital Course: Carla [name] is a 61 y.o. female with medical history significant for CAD,PAD status post right AKA complicated by stump abscess s/p treatment ,left axillary femoral bypass graft, diastolic CHF, COPD, chronic  mesenteric ischemia, and atrial fibrillation on Eliquis, hypertension, , depression, CAD, obesity, chronic pain, recently hospitalized from 11/4-11/9 for I&D of a left thigh hematoma and discharged to SNF, who was sent in for evaluation of a 5-day history of lower abdominal pain.  Patient denies nausea, vomiting or diarrhea and denies fevers or chills.  She denies chest pain or shortness of breath.  11/16:  BP 95/56 on arrival, fluid responsive to 107/83 by admission.  Vitals otherwise within normal limits.  Labs with normal WBC with lactic acid 1.2.  Potassium 2.7.  Hemoglobin 9.6 which is above baseline 9.8-10.4.  EKG, personally viewed and interpreted showing sinus rhythm at 71 with no ischemic ST-T wave changes.  CT abdomen and pelvis showed a large RLQ ventral hernia with multiple loops of large and small bowel without incarceration or obstruction as well as significant fecal retention throughout the colon consistent with constipation. Kept under observation for hypotension and eval abdominal pain. Lactate trended up some. Unable to get CT w/ contrast d/t inadequate venous access. 11/17:  General surgery consulted -  confirmed that lower abdominal pain most likely related to constipation / stool burden. Pt had BM and pain has improved. Started on Movantik to hopefully help prevent opioid-induced constipation.   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)   Abdominal pain likely d/t constipation, less likely mesenteric ischemia / bowel obstruction Ventral hernia without obstruction or gangrene Constipation, Fecal retention 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  No evidence for sepsis at this time IV hydration Continue to monitor Hold antihypertensives for now --> restarted on discharge, BP has improved.   Hypokalemia - improved IV repletion given in the ED Monitor and replete as necessary  COPD (chronic obstructive pulmonary disease) ([place]) Not acutely exacerbated Continue home inhalers.   DuoNebs as needed  PAF (paroxysmal atrial fibrillation) (HCC) Currently rate controlled Continue apixaban Holding metoprolol due to hypotension --> restart on discharge   Chronic diastolic CHF (congestive heart failure) ([place]) Patient is euvolemic to dry Restart GDMT Daily weights  CAD (coronary artery disease) No complaints of chest pain and EKG is nonacute Continue aspirin and atorvastatin.   Holding metoprolol due to hypotension  Anxiety and depression Continue Celexa         Discharge Instructions  Allergies as of 09/08/2022       Reactions   Gabapentin    Tremors    Bactrim [sulfamethoxazole-trimethoprim] Itching        Medication List     STOP taking these medications    metolazone 2.5 MG tablet Commonly known as: ZAROXOLYN       TAKE these medications    alum & mag hydroxide-simeth 200-200-20 MG/5ML suspension Commonly  known as: MAALOX/MYLANTA Take 15-30 mLs by mouth every 2 (two) hours as needed for indigestion.   apixaban 5 MG Tabs tablet Commonly known as: ELIQUIS Take 1 tablet (5 mg total) by mouth 2 (two) times daily.   aspirin EC 81 MG tablet Take 81 mg by mouth daily. Swallow whole.   atorvastatin 80 MG tablet Commonly known as: LIPITOR Take 80 mg by mouth at bedtime.   bisacodyl 5 MG EC tablet Commonly known as: DULCOLAX Take 1 tablet (5 mg total) by mouth daily as needed for moderate constipation.   cefTRIAXone 1 g injection Commonly known as: ROCEPHIN Inject 1 g into the muscle daily.   citalopram 40 MG tablet Commonly known as: CeleXA Take 1 tablet (40 mg total) by mouth daily.   Combivent Respimat 20-100 MCG/ACT Aers respimat Generic drug: Ipratropium-Albuterol Inhale 1 puff into the lungs every 6 (six) hours as needed.   esomeprazole 20 MG capsule Commonly known as: NEXIUM Take 20 mg by mouth daily.   feeding supplement Liqd Take 237 mLs by mouth 2 (two) times daily between meals.   ferrous sulfate 325 (65 FE) MG tablet Take 325 mg by mouth daily.   furosemide 40 MG tablet Commonly known as: LASIX Take 0.5 tablets (20 mg total) by mouth  daily.   HYDROcodone-acetaminophen 5-325 MG tablet Commonly known as: NORCO/VICODIN Take 1-2 tablets by mouth every 4 (four) hours as needed for severe pain or moderate pain (1 pill for moderate pain, 2 for severe).   isosorbide mononitrate 60 MG 24 hr tablet Commonly known as: IMDUR Take 60 mg by mouth daily.   lidocaine 5 % Commonly known as: LIDODERM Place 1 patch onto the skin daily. Remove & Discard patch within 12 hours or as directed by MD   metoprolol tartrate 25 MG tablet Commonly known as: LOPRESSOR Take 0.5 tablets (12.5 mg total) by mouth 2 (two) times daily. Hold if SBP is 100 or less or HR is less than 60. Notify Provider   morphine 15 MG 12 hr tablet Commonly known as: MS CONTIN Take 1 tablet (15 mg total) by  mouth every 12 (twelve) hours.   multivitamin with minerals Tabs tablet Take 1 tablet by mouth daily.   naloxegol oxalate 12.5 MG Tabs tablet Commonly known as: MOVANTIK Take 1 tablet (12.5 mg total) by mouth daily. Start taking on: September 09, 2022   ondansetron 4 MG tablet Commonly known as: ZOFRAN Take 4 mg by mouth every 8 (eight) hours as needed for nausea or vomiting.   polyethylene glycol 17 g packet Commonly known as: MIRALAX / GLYCOLAX Take 17 g by mouth daily.   pregabalin 200 MG capsule Commonly known as: LYRICA Take 200 mg by mouth in the morning, at noon, and at bedtime.               Discharge Care Instructions  (From admission, onward)           Start     Ordered   09/08/22 0000  Discharge wound care:       Comments: Buttocks Left Stage 2 -  Partial thickness loss of dermis presenting as a shallow open injury with a red, pink wound bed without slough.  Wound vac to remain in place total 2-3 weeks per vascular surgery team   09/08/22 1240             Contact information for after-discharge care     Destination     [facility] .   Service: Skilled Nursing Contact information: [address] [place] [phone]                     Allergies  Allergen Reactions   Gabapentin     Tremors    Bactrim [Sulfamethoxazole-Trimethoprim] Itching     Subjective: p reports has had BM and pain has improved somewhat, she asks about discharge back to SNF today if possible    Discharge Exam: BP (!) 144/69 (BP Location: Right Arm)   Pulse 75   Temp 98.3 F (36.8 C)   Resp 17   Ht '5\' 2"'$  (1.575 m)   Wt 97.5 kg   SpO2 96%   BMI 39.32 kg/m  General: Pt is alert, awake, not in acute distress Cardiovascular: RRR, S1/S2 +, no rubs, no gallops Respiratory: CTA bilaterally, no wheezing, no rhonchi Abdominal: Soft, NT, ND, bowel sounds + Extremities: no edema, no cyanosis. R  AKA     The results of significant diagnostics from this hospitalization (including imaging, microbiology, ancillary and laboratory) are listed below for reference.     Microbiology: No results found for this or any previous visit (from the past 240 hour(s)).   Labs: BNP (last 3 results) Recent Labs  01/18/22 1646 08/26/22 0810  BNP 29.0 50.9   Basic Metabolic Panel: Recent Labs  Lab 09/07/22 1621 09/08/22 0126 09/08/22 0600  NA 137  --  138  K 2.7* 3.2* 3.1*  CL 95*  --  98  CO2 30  --  33*  GLUCOSE 126*  --  106*  BUN 25*  --  20  CREATININE 0.63  --  0.61  CALCIUM 8.7*  --  8.6*   Liver Function Tests: Recent Labs  Lab 09/07/22 1621 09/08/22 0600  AST 20 15  ALT 14 16  ALKPHOS 106 111  BILITOT 0.4 0.5  PROT 6.6 6.4*  ALBUMIN 2.7* 2.5*   Recent Labs  Lab 09/07/22 1621  LIPASE 31   No results for input(s): "AMMONIA" in the last 168 hours. CBC: Recent Labs  Lab 09/07/22 1621 09/08/22 0600  WBC 8.6 7.5  HGB 9.6* 9.5*  HCT 30.4* 29.7*  MCV 90.2 90.8  PLT 236 238   Cardiac Enzymes: No results for input(s): "CKTOTAL", "CKMB", "CKMBINDEX", "TROPONINI" in the last 168 hours. BNP: Invalid input(s): "POCBNP" CBG: No results for input(s): "GLUCAP" in the last 168 hours. D-Dimer No results for input(s): "DDIMER" in the last 72 hours. Hgb A1c No results for input(s): "HGBA1C" in the last 72 hours. Lipid Profile No results for input(s): "CHOL", "HDL", "LDLCALC", "TRIG", "CHOLHDL", "LDLDIRECT" in the last 72 hours. Thyroid function studies No results for input(s): "TSH", "T4TOTAL", "T3FREE", "THYROIDAB" in the last 72 hours.  Invalid input(s): "FREET3" Anemia work up No results for input(s): "VITAMINB12", "FOLATE", "FERRITIN", "TIBC", "IRON", "RETICCTPCT" in the last 72 hours. Urinalysis    Component Value Date/Time   COLORURINE YELLOW (A) 09/07/2022 2243   APPEARANCEUR CLEAR (A) 09/07/2022 2243   APPEARANCEUR Clear 01/19/2015 1112   LABSPEC  1.016 09/07/2022 2243   LABSPEC 1.019 01/19/2015 1112   PHURINE 6.0 09/07/2022 2243   GLUCOSEU NEGATIVE 09/07/2022 2243   GLUCOSEU Negative 01/19/2015 1112   HGBUR NEGATIVE 09/07/2022 2243   BILIRUBINUR NEGATIVE 09/07/2022 2243   BILIRUBINUR Negative 01/19/2015 1112   KETONESUR NEGATIVE 09/07/2022 2243   PROTEINUR NEGATIVE 09/07/2022 2243   NITRITE NEGATIVE 09/07/2022 2243   LEUKOCYTESUR NEGATIVE 09/07/2022 2243   LEUKOCYTESUR Negative 01/19/2015 1112   Sepsis Labs Recent Labs  Lab 09/07/22 1621 09/08/22 0600  WBC 8.6 7.5   Microbiology No results found for this or any previous visit (from the past 240 hour(s)). Imaging CT ABDOMEN PELVIS WO CONTRAST  Result Date: 09/07/2022 CLINICAL DATA:  Right lower quadrant abdominal pain for 5 days EXAM: CT ABDOMEN AND PELVIS WITHOUT CONTRAST TECHNIQUE: Multidetector CT imaging of the abdomen and pelvis was performed following the standard protocol without IV contrast. Unenhanced CT was performed per clinician order. Lack of IV contrast limits sensitivity and specificity, especially for evaluation of abdominal/pelvic solid viscera. RADIATION DOSE REDUCTION: This exam was performed according to the departmental dose-optimization program which includes automated exposure control, adjustment of the mA and/or kV according to patient size and/or use of iterative reconstruction technique. COMPARISON:  03/21/2022 FINDINGS: Lower chest: No acute pleural or parenchymal lung disease. Hepatobiliary: Cholecystectomy. Unremarkable unenhanced appearance of the liver. No biliary duct dilation. Pancreas: Unremarkable unenhanced appearance. Spleen: Unremarkable unenhanced appearance. Adrenals/Urinary Tract: Stable benign left adrenal adenoma. The right adrenals unremarkable. No urinary tract calculi or obstructive uropathy within either kidney. There is high attenuation material seen within the bilateral renal pelves, please correlate with any recent contrasted  procedure. Bladder is unremarkable. Stomach/Bowel: No bowel obstruction or ileus. There  is a large amount of retained stool throughout the colon consistent with constipation. No bowel wall thickening or inflammatory change. Vascular/Lymphatic: Stable atherosclerosis of the abdominal aorta. Stents are seen within the SMA and bilateral common iliac arteries. Evaluation of the vascular lumen is limited without IV contrast. No pathologic adenopathy within the abdomen or pelvis. Reproductive: Status post hysterectomy. No adnexal masses. Other: No free fluid or free intraperitoneal gas. Large right lower quadrant ventral hernia again identified, containing a significant portion of distal small bowel and proximal colon. No evidence of incarceration or obstruction. Musculoskeletal: No acute or destructive bony lesions. Reconstructed images demonstrate no additional findings. IMPRESSION: 1. No urinary tract calculi or obstructive uropathy. High attenuation material is seen excreted within the bilateral renal pelves and proximal ureters, please correlate with any recent contrast administration. 2. Large right lower quadrant ventral hernia containing multiple loops of large and small bowel. No incarceration or obstruction. 3. Significant fecal retention throughout the colon consistent with constipation. 4.  Aortic Atherosclerosis (ICD10-I70.0). Electronically Signed   By: Randa Ngo M.D.   On: 09/07/2022 20:46      Time coordinating discharge: over 30 minutes  SIGNED:  Emeterio Reeve DO [facility]

## 2022-09-08 NOTE — Consult Note (Signed)
[facility] SURGICAL CONSULTATION NOTE (initial) - cpt: 40981   HISTORY OF PRESENT ILLNESS (HPI):  61 y.o. female presented to [facility] ED yesterday for evaluation of abdominal pain. Patient reports somewhat diffuse abdominal discomfort over the course of the last 5-6 days but seemed subjectively worse in the RLQ. She is associated with constipation. No fever, chills, nausea, emesis. She has been able to pass flatus. Of note, she has a known large ventral hernia in this area continuation loops of bowel. She has followed with Dr Dahlia Byes for this as an outpatient in the past and deemed not to be a surgical candidate given her comorbid disease. Work up in the ED revealed a normal WBC to 8.6K, hypokalemia to 2.7, renal function normal with sCr - 0.63, normal lactic acid level to 1.2 (2.0 this AM). She did have CT Abdomen/Pelvis which showed known large hernia without evidence of incarceration, strangulation, nor obstruction however she was found to have significant stool burden. She was admitted to the medicine service.   Surgery is consulted by hospitalist physician Dr. Judd Gaudier, MD in this context for evaluation and management of large ventral hernia.  PAST MEDICAL HISTORY (PMH):  Past Medical History:  Diagnosis Date   Abdominal aortic atherosclerosis ([place])    a. 05/2017 CTA abd/pelvis: significant atherosclerotic dzs of the infrarenal abd Ao w/ some mural thrombus. No aneurysm or dissection.   Acute focal ischemia of small intestine (HCC)    Acute right-sided low back pain with right-sided sciatica 06/13/2017   AKI (acute kidney injury) ([place]) 07/29/2017   Anemia    Anginal pain ([place])    a. 08/2012 Lexiscan MV: EF 54%, non ischemia/infarct.   Anxiety and depression    Baker's cyst of knee, right    a. 07/2016 U/S: 4.1 x 1.4 x 2.9 cystic structure in R poplitetal fossa.   Bell's palsy    CAD (coronary artery disease)    CHF (congestive heart failure) ([place])    a.) TTE 06/20/2017:  EF 60-65%, no rwma, G1DD, no source of cardiac emboli.   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 05/2017 CTA Abd/pelvis: apparent thrombus or embolus in prox SMA (70-90%); b. 05/2017 catheter directed tPA, mechanical thrombectomy, and stenting of the SMA.   Essential hypertension with goal blood pressure less than 140/90 01/19/2016   Gastroesophageal reflux disease 01/19/2016   GERD (gastroesophageal reflux disease)    H/O colectomy    History of 2019 novel coronavirus disease (COVID-19) 11/14/2019   History of kidney stones    Hyperlipidemia    Hypertension    Morbid obesity with BMI of 40.0-44.9, adult ([place])    NSTEMI (non-ST elevated myocardial infarction) ([place]) 06/27/2021   a.) high sensitivity troponins trended: 893 --> 1587 --> 1748 --> 868 --> 735 ng/L   Occlusive mesenteric ischemia ([place]) 06/19/2017   Osteoarthritis    PAD (peripheral artery disease) (HCC)    PAF (paroxysmal atrial fibrillation) (HCC)    a.) CHA2DS2-VASc = 6 (sex, CHF, HTN, DVT x2, aortic plaque). b.) rate/rhythm maintained on oral metoprolol tartrate; chronically anticoagulated with standard dose apixaban.   Palpitations    Peripheral edema    Personal history of other malignant neoplasm of skin 06/01/2016   Pneumonia    Pre-diabetes  Primary osteoarthritis of both knees 06/13/2017   SBO (small bowel obstruction) ([place]) 08/06/2017   Skin cancer of nose    Superior mesenteric artery thrombosis (HCC)    Vitamin D deficiency      PAST SURGICAL HISTORY ([place]):  Past Surgical History:  Procedure Laterality Date   AMPUTATION Right 04/06/2022   Procedure: AMPUTATION ABOVE KNEE;  Surgeon: Algernon Huxley, MD;  Location: [facility];  Service: Vascular;  Laterality: Right;   AMPUTATION TOE Left 12/09/2019    Procedure: AMPUTATION TOE MPJ left;  Surgeon: Caroline More, DPM;  Location: [facility];  Service: Podiatry;  Laterality: Left;   APPENDECTOMY     APPLICATION OF WOUND VAC  12/01/2021   Procedure: APPLICATION OF WOUND VAC;  Surgeon: Algernon Huxley, MD;  Location: [facility];  Service: Vascular;;   APPLICATION OF WOUND VAC Right 05/03/2022   Procedure: APPLICATION OF WOUND VAC;  Surgeon: Algernon Huxley, MD;  Location: [facility];  Service: Vascular;  Laterality: Right;   APPLICATION OF WOUND VAC Left 07/23/2022   Procedure: APPLICATION OF WOUND VAC;  Surgeon: Serafina Mitchell, MD;  Location: [facility];  Service: Vascular;  Laterality: Left;  [place]   APPLICATION OF WOUND VAC Right 08/28/2022   Procedure: APPLICATION OF WOUND VAC;  Surgeon: Algernon Huxley, MD;  Location: [facility];  Service: Vascular;  Laterality: Right;   AXILLARY-FEMORAL BYPASS GRAFT Left 07/23/2022   Procedure: BYPASS GRAFT AXILLA-BIFEMORAL;  Surgeon: Serafina Mitchell, MD;  Location: [facility];  Service: Vascular;  Laterality: Left;   CHOLECYSTECTOMY     COLON SURGERY     COLONOSCOPY WITH PROPOFOL N/A 03/23/2022   Procedure: COLONOSCOPY WITH PROPOFOL;  Surgeon: Jonathon Bellows, MD;  Location: [facility];  Service: Gastroenterology;  Laterality: N/A;  rectal bleed   EMBOLECTOMY OR THROMBECTOMY, WITH OR WITHOUT CATHETER; FEMOROPOPLITEAL, AORTOILIAC ARTERY, BY LEG INCISION N/A 06/21/2020   Left - Decomp Fasciotomy Leg; Ant &/Or Lat Compart Only; Midline - Angiography, Visceral, Selective Or Supraselective (With Or Without Flush Aortogram); Left - Revascularize, Endovasc, Open/Percut, Iliac Artery, Unilat, Initial Vessel; W/Translum Stent, W/Angioplasty; Right - Revascularize, Endovasc, Open/Percut, Iliac Artery, Ea Add`L Ipsilateral; W/Translumin Stent, W/Angioplasty; Location [facility];   ENDARTERECTOMY FEMORAL Left 12/01/2021   Procedure: ENDARTERECTOMY FEMORAL;  Surgeon: Algernon Huxley, MD;  Location: [facility];  Service: Vascular;  Laterality: Left;    ENDOVASCULAR REPAIR/STENT GRAFT Left 12/01/2021   Procedure: ENDOVASCULAR REPAIR/STENT GRAFT;  Surgeon: Algernon Huxley, MD;  Location: [place] [facility];  Service: Cardiovascular;  Laterality: Left;   INCISION AND DRAINAGE OF WOUND Right 05/03/2022   Procedure: IRRIGATION AND DEBRIDEMENT RIGHT STUMP;  Surgeon: Algernon Huxley, MD;  Location: [facility];  Service: Vascular;  Laterality: Right;   IRRIGATION AND DEBRIDEMENT ABSCESS Right 08/28/2022   Procedure: AKA IRRIGATION AND DEBRIDEMENT ABSCESS;  Surgeon: Algernon Huxley, MD;  Location: [facility];  Service: Vascular;  Laterality: Right;   LAPAROTOMY N/A 08/08/2017   Procedure: EXPLORATORY LAPAROTOMY POSSIBLE BOWEL RESECTION;  Surgeon: Jules Husbands, MD;  Location: [facility];  Service: General;  Laterality: N/A;   LEFT HEART CATH AND CORONARY ANGIOGRAPHY N/A 05/17/2021   Procedure: LEFT HEART CATH AND CORONARY ANGIOGRAPHY;  Surgeon: Yolonda Kida, MD;  Location: [place];  Service: Cardiovascular;  Laterality: N/A;   LOWER EXTREMITY ANGIOGRAPHY Left 11/17/2019   Procedure: LOWER EXTREMITY ANGIOGRAPHY;  Surgeon: Algernon Huxley, MD;  Location: [place] [facility];  Service: Cardiovascular;  Laterality: Left;   LOWER EXTREMITY  ANGIOGRAPHY Right 12/13/2020   Procedure: LOWER EXTREMITY ANGIOGRAPHY;  Surgeon: Algernon Huxley, MD;  Location: [place] [facility];  Service: Cardiovascular;  Laterality: Right;   LOWER EXTREMITY ANGIOGRAPHY Right 01/10/2021   Procedure: LOWER EXTREMITY ANGIOGRAPHY;  Surgeon: Algernon Huxley, MD;  Location: [place] [facility];  Service: Cardiovascular;  Laterality: Right;   LOWER EXTREMITY ANGIOGRAPHY Right 01/24/2021   Procedure: LOWER EXTREMITY ANGIOGRAPHY;  Surgeon: Algernon Huxley, MD;  Location: [place] [facility];  Service: Cardiovascular;  Laterality: Right;   LOWER EXTREMITY ANGIOGRAPHY Bilateral 06/28/2021   Procedure: Lower Extremity Angiography;  Surgeon: Algernon Huxley, MD;  Location: [place] [facility];  Service:  Cardiovascular;  Laterality: Bilateral;   LOWER EXTREMITY ANGIOGRAPHY Right 11/28/2021   Procedure: LOWER EXTREMITY ANGIOGRAPHY;  Surgeon: Algernon Huxley, MD;  Location: [place] [facility];  Service: Cardiovascular;  Laterality: Right;   LOWER EXTREMITY ANGIOGRAPHY Left 11/30/2021   Procedure: Lower Extremity Angiography;  Surgeon: Algernon Huxley, MD;  Location: [place] [facility];  Service: Cardiovascular;  Laterality: Left;   LOWER EXTREMITY ANGIOGRAPHY Right 02/22/2022   Procedure: Lower Extremity Angiography;  Surgeon: Algernon Huxley, MD;  Location: [facility];  Service: Cardiovascular;  Laterality: Right;   LOWER EXTREMITY ANGIOGRAPHY Right 03/13/2022   Procedure: Lower Extremity Angiography;  Surgeon: Algernon Huxley, MD;  Location: [place] [facility];  Service: Cardiovascular;  Laterality: Right;   LOWER EXTREMITY ANGIOGRAPHY Right 03/24/2022   Procedure: Lower Extremity Angiography;  Surgeon: Algernon Huxley, MD;  Location: [place] [facility];  Service: Cardiovascular;  Laterality: Right;   LOWER EXTREMITY INTERVENTION N/A 11/19/2019   Procedure: LOWER EXTREMITY INTERVENTION;  Surgeon: Algernon Huxley, MD;  Location: [place] [facility];  Service: Cardiovascular;  Laterality: N/A;   LOWER EXTREMITY INTERVENTION Bilateral 06/29/2021   Procedure: LOWER EXTREMITY INTERVENTION;  Surgeon: Algernon Huxley, MD;  Location: [place] [facility];  Service: Cardiovascular;  Laterality: Bilateral;   TEE WITHOUT CARDIOVERSION N/A 06/22/2017   Procedure: TRANSESOPHAGEAL ECHOCARDIOGRAM (TEE);  Surgeon: Wellington Hampshire, MD;  Location: [facility];  Service: Cardiovascular;  Laterality: N/A;   TOTAL KNEE ARTHROPLASTY Right 07/11/2018   Procedure: TOTAL KNEE ARTHROPLASTY;  Surgeon: Corky Mull, MD;  Location: [facility];  Service: Orthopedics;  Laterality: Right;   VAGINAL HYSTERECTOMY     VISCERAL ARTERY INTERVENTION N/A 06/20/2017   Procedure: Visceral Artery Intervention, possible aortic thrombectomy;  Surgeon:  Algernon Huxley, MD;  Location: [place] [facility];  Service: Cardiovascular;  Laterality: N/A;   VISCERAL ARTERY INTERVENTION N/A 01/28/2018   Procedure: VISCERAL ARTERY INTERVENTION;  Surgeon: Algernon Huxley, MD;  Location: [place] [facility];  Service: Cardiovascular;  Laterality: N/A;     MEDICATIONS:  Prior to Admission medications   Medication Sig Start Date End Date Taking? Authorizing Provider  apixaban (ELIQUIS) 5 MG TABS tablet Take 1 tablet (5 mg total) by mouth 2 (two) times daily. 01/25/21  Yes Stegmayer, Janalyn [place], PA-C  aspirin EC 81 MG tablet Take 81 mg by mouth daily. Swallow whole.   Yes [provider]  atorvastatin (LIPITOR) 80 MG tablet Take 80 mg by mouth at bedtime.   Yes [provider]  cefTRIAXone (ROCEPHIN) 1 g injection Inject 1 g into the muscle daily. 09/06/22  Yes [provider]  citalopram (CELEXA) 40 MG tablet Take 1 tablet (40 mg total) by mouth daily. 02/04/22 09/07/22 Yes Enzo Bi, MD  esomeprazole (NEXIUM) 20 MG capsule  Take 20 mg by mouth daily. 07/06/22  Yes [provider]  feeding supplement (ENSURE ENLIVE / ENSURE PLUS) LIQD Take 237 mLs by mouth 2 (two) times daily between meals. 05/06/22  Yes Fritzi Mandes, MD  ferrous sulfate 325 (65 FE) MG tablet Take 325 mg by mouth daily.   Yes [provider]  furosemide (LASIX) 40 MG tablet Take 0.5 tablets (20 mg total) by mouth daily. 08/11/22  Yes Lorella Nimrod, MD  isosorbide mononitrate (IMDUR) 60 MG 24 hr tablet Take 60 mg by mouth daily. 01/23/22  Yes [provider]  lidocaine (LIDODERM) 5 % Place 1 patch onto the skin daily. Remove & Discard patch within 12 hours or as directed by MD 08/11/22  Yes Lorella Nimrod, MD  metolazone (ZAROXOLYN) 2.5 MG tablet Take 2.5 mg by mouth daily.   Yes [provider]  metoprolol tartrate (LOPRESSOR) 25 MG tablet Take 0.5 tablets (12.5 mg total) by mouth 2 (two) times daily. Patient taking differently: Take 12.5 mg  by mouth 2 (two) times daily. Hold if SBP is 100 or less or HR is less than 60. Notify Provider 08/11/22  Yes Lorella Nimrod, MD  morphine (MS CONTIN) 15 MG 12 hr tablet Take 1 tablet (15 mg total) by mouth every 12 (twelve) hours. 08/31/22  Yes Richarda Osmond, MD  Multiple Vitamin (MULTIVITAMIN WITH MINERALS) TABS tablet Take 1 tablet by mouth daily. 03/25/22  Yes Fritzi Mandes, MD  polyethylene glycol (MIRALAX / GLYCOLAX) 17 g packet Take 17 g by mouth daily. 08/12/22  Yes Lorella Nimrod, MD  pregabalin (LYRICA) 200 MG capsule Take 200 mg by mouth in the morning, at noon, and at bedtime.   Yes [provider]  alum & mag hydroxide-simeth (MAALOX/MYLANTA) 200-200-20 MG/5ML suspension Take 15-30 mLs by mouth every 2 (two) hours as needed for indigestion. 08/11/22   Lorella Nimrod, MD  COMBIVENT RESPIMAT 20-100 MCG/ACT AERS respimat Inhale 1 puff into the lungs every 6 (six) hours as needed. 03/27/22   [provider]  HYDROcodone-acetaminophen (NORCO/VICODIN) 5-325 MG tablet Take 1-2 tablets by mouth every 4 (four) hours as needed for severe pain or moderate pain (1 pill for moderate pain, 2 for severe). 08/31/22   Richarda Osmond, MD  ondansetron (ZOFRAN) 4 MG tablet Take 4 mg by mouth every 8 (eight) hours as needed for nausea or vomiting.    [provider]     ALLERGIES:  Allergies  Allergen Reactions   Gabapentin     Tremors    Bactrim [Sulfamethoxazole-Trimethoprim] Itching     SOCIAL HISTORY:  Social History   Socioeconomic History   Marital status: Single    Spouse name: Not on file   Number of children: Not on file   Years of education: Not on file   Highest education level: Not on file  Occupational History   Occupation: Unemployed  Tobacco Use   Smoking status: Former    Packs/day: 1.00    Types: Cigarettes    Quit date: 2023    Years since quitting: 0.8   Smokeless tobacco: Never  Vaping Use   Vaping Use: Never used  Substance and Sexual  Activity   Alcohol use: No   Drug use: No   Sexual activity: Not on file  Other Topics Concern   Not on file  Social History Narrative   Lives in [place] with daughter   Social Determinants of Health   Financial Resource Strain: Not on file  Food Insecurity:  No Food Insecurity (09/08/2022)   Hunger Vital Sign    Worried About Running Out of Food in the Last Year: Never true    Ran Out of Food in the Last Year: Never true  Transportation Needs: No Transportation Needs (09/08/2022)   PRAPARE - Hydrologist (Medical): No    Lack of Transportation (Non-Medical): No  Physical Activity: Not on file  Stress: Not on file  Social Connections: Not on file  Intimate Partner Violence: Not At Risk (09/08/2022)   Humiliation, Afraid, Rape, and Kick questionnaire    Fear of Current or Ex-Partner: No    Emotionally Abused: No    Physically Abused: No    Sexually Abused: No     FAMILY HISTORY:  Family History  Problem Relation Age of Onset   Hypertension Mother    Heart disease Mother    Heart attack Mother    Breast cancer Mother    Hypertension Father    Breast cancer Paternal Aunt       REVIEW OF SYSTEMS:  Review of Systems  Constitutional:  Negative for chills and fever.  Respiratory:  Negative for cough and shortness of breath.   Cardiovascular:  Negative for chest pain and palpitations.  Gastrointestinal:  Positive for abdominal pain and constipation. Negative for diarrhea, nausea and vomiting.  Genitourinary:  Negative for dysuria and urgency.  All other systems reviewed and are negative.   VITAL SIGNS:  Temp:  [98.2 F (36.8 C)-98.6 F (37 C)] 98.6 F (37 C) (11/17 0021) Pulse Rate:  [68-76] 68 (11/17 0021) Resp:  [15-20] 18 (11/17 0021) BP: (95-107)/(56-83) 102/62 (11/17 0021) SpO2:  [93 %-100 %] 100 % (11/17 0021) Weight:  [97.5 kg] 97.5 kg (11/16 1612)     Height: '5\' 2"'$  (157.5 cm) Weight: 97.5 kg BMI (Calculated): 39.31    INTAKE/OUTPUT:  11/16 0701 - 11/17 0700 In: -  Out: 450 [Urine:450]  PHYSICAL EXAM:  Physical Exam Vitals and nursing note reviewed. Exam conducted with a chaperone present.  Constitutional:      General: She is not in acute distress.    Appearance: She is well-developed. She is obese.  HENT:     Head: Normocephalic and atraumatic.  Eyes:     Extraocular Movements: Extraocular movements intact.  Abdominal:     General: Abdomen is protuberant. There is no distension.     Palpations: Abdomen is soft.     Tenderness: There is generalized abdominal tenderness. There is no guarding or rebound.     Hernia: A hernia is present. Hernia is present in the ventral area.     Comments: Abdomen is obese, soft, she is diffusely tender throughout her abdomen, there is large ventral hernia which is soft, does not appear overtly distended. She is not overtly peritonitic. There is a small ulceration overlaying the hernia but this is scabbing over.   Genitourinary:    Comments: Deferred Musculoskeletal:     Comments: Right AKA  Skin:    General: Skin is warm and dry.  Neurological:     General: No focal deficit present.     Mental Status: She is alert and oriented to person, place, and time.  Psychiatric:        Mood and Affect: Mood normal.        Behavior: Behavior normal.      Labs:     Latest Ref Rng & Units 09/08/2022    6:00 AM 09/07/2022    4:21 PM  08/31/2022    4:11 AM  CBC  WBC 4.0 - 10.5 K/uL 7.5  8.6  7.8   Hemoglobin 12.0 - 15.0 g/dL 9.5  9.6  10.4   Hematocrit 36.0 - 46.0 % 29.7  30.4  32.9   Platelets 150 - 400 K/uL 238  236  212       Latest Ref Rng & Units 09/08/2022    6:00 AM 09/08/2022    1:26 AM 09/07/2022    4:21 PM  CMP  Glucose 70 - 99 mg/dL 106   126   BUN 8 - 23 mg/dL 20   25   Creatinine 0.44 - 1.00 mg/dL 0.61   0.63   Sodium 135 - 145 mmol/L 138   137   Potassium 3.5 - 5.1 mmol/L 3.1  3.2  2.7   Chloride 98 - 111 mmol/L 98   95   CO2 22 - 32  mmol/L 33   30   Calcium 8.9 - 10.3 mg/dL 8.6   8.7   Total Protein 6.5 - 8.1 g/dL 6.4   6.6   Total Bilirubin 0.3 - 1.2 mg/dL 0.5   0.4   Alkaline Phos 38 - 126 U/L 111   106   AST 15 - 41 U/L 15   20   ALT 0 - 44 U/L 16   14     Imaging studies:   CT Abdomen/Pelvis (09/07/2022) personally reviewed which shows known large ventral hernia, this contains both loops of large and small intestine, there is no evidence of obstruction, incarceration, nor strangulation, she has large stool burden, no free air, no pneumatosis, and radiologist report reviewed below: IMPRESSION: 1. No urinary tract calculi or obstructive uropathy. High attenuation material is seen excreted within the bilateral renal pelves and proximal ureters, please correlate with any recent contrast administration. 2. Large right lower quadrant ventral hernia containing multiple loops of large and small bowel. No incarceration or obstruction. 3. Significant fecal retention throughout the colon consistent with constipation. 4.  Aortic Atherosclerosis (ICD10-I70.0).   Assessment/Plan: (ICD-10's: K6.6) 61 y.o. female with diffuse abdominal pain likely secondary to severe constipation with known large ventral hernia without evidence of obstruction, incarceration, nor strangulation, complicated by numerous pertinent comorbidities.   - In regards to her hernia, this is stable compared to previous examinations and she is certainly without any evidence of incarceration, strangulation, nor obstruction on examination, imaging, and radiographs. She has been evaluated for this as outpatient by our service and deemed a non-operative candidate given her comorbidities, and this remains true. No indication for surgical intervention at this time  - Seems that a lot of her pain may be attributed to significant stool burden on imaging. Would recommend aggressive bowel regimen from above (ie: Miralax) and below (ie: enemas)  - Monitor abdominal  examination; on-going bowel function  - pain control prn; antiemetics prn  - Further management per primary service   All of the above findings and recommendations were discussed with the patient, and all of patient's questions were answered to her expressed satisfaction.  Thank you for the opportunity to participate in this patient's care.   -- Edison Simon, PA-C [facility] 09/08/2022, 7:55 AM M-F: 7am - 4pm

## 2022-09-08 NOTE — Progress Notes (Signed)
Dr. Damita Dunnings placed an order for a CT of abdomen with contrast.    CT tech called and reported that pt's veins could not tolerate contrast and that's the reason why they did not complete the scan with contrast earlier.    Dr. Damita Dunnings notified.  She was also made aware of IV team being consulted for the current IV and it was the biggest bore needle they could place.  Dr. Damita Dunnings replied "we will hold off on it (the CT scan with contrast)."

## 2022-09-08 NOTE — NC FL2 (Signed)
[employer] LEVEL OF CARE SCREENING TOOL     IDENTIFICATION  Patient Name: Carla [name] Birthdate: Mar 09, 1961 Sex: female Admission Date (Current Location): 09/07/2022  [facility] and [place] Number:  Engineering geologist and Address:  [facility], [address] [place], [place] [address]      Provider Number: [id number]  Attending Physician Name and Address:  Emeterio Reeve, DO  Relative Name and Phone Number:  [name] Daughter [phone]    Current Level of Care: Hospital Recommended Level of Care: [place] Prior Approval Number:    Date Approved/Denied:   PASRR Number: [id number]  Discharge Plan: SNF    Current Diagnoses: Patient Active Problem List   Diagnosis Date Noted   Leg abscess 08/31/2022   Anxiety 32/67/1245   Chronic systolic CHF (congestive heart failure) ([place]) 08/30/2022   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 ([place]) 08/29/2022   Pressure injury of skin 08/28/2022   Right leg pain 08/26/2022   HLD (hyperlipidemia) 08/26/2022   Iron deficiency anemia 08/26/2022   Acute blood loss anemia 08/09/2022   Hematoma 07/26/2022   Hematoma of right thigh 07/24/2022   Acute respiratory failure with hypoxia ([place]) 05/02/2022   Lactic acidosis 05/02/2022   Hypoalbuminemia due to protein-calorie malnutrition ([place]) 05/02/2022   Atherosclerosis of artery of extremity with rest pain ([place]) 04/06/2022   Rectal bleeding 03/21/2022   Hypokalemia 02/03/2022   Chronic anticoagulation    Anxiety and depression    Palpitations    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[place]) 12/03/2021   Critical limb ischemia of left lower extremity ([place]) 11/30/2021   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[place]) 11/11/2021   Arterial occlusion 11/11/2021   CAD (coronary artery disease) 07/07/2021   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[place]) 06/27/2021   Ischemic pain of right foot 06/27/2021   PAD (peripheral artery disease) ([place])  05/03/2021   Tobacco abuse counseling 10/26/2020   Smoker 10/26/2020   Chronic diastolic CHF (congestive heart failure) ([place]) 10/06/2020   Chronic pain 12/01/2019   Primary osteoarthritis involving multiple joints 12/01/2019   Carpal tunnel syndrome, right 11/10/2019   Health care maintenance 03/26/2019   Disorder of skeletal system 03/26/2019   Problems influencing health status 03/26/2019   Chronic low back pain (Secondary area of Pain) (Bilateral) w/o sciatica 03/26/2019   Aortic atherosclerosis ([place]) 09/12/2018   COPD (chronic obstructive pulmonary disease) ([place]) 09/12/2018   Moderate episode of recurrent major depressive disorder ([place]) 09/12/2018   Lumbar spondylosis 09/06/2018   Ventral hernia with obstruction and without gangrene 07/02/2018   PAF (paroxysmal atrial fibrillation) ([place]) 06/15/2018   Acute encephalopathy 04/08/2018   DDD (degenerative disc disease), lumbosacral 09/25/2017   Osteoarthritis of knee 09/25/2017   Adjustment disorder with anxiety 08/15/2017   Protein-calorie malnutrition, severe 08/08/2017   Chronic mesenteric ischemia (HCC)    AKI (acute kidney injury) ([place]) 07/29/2017   Hypotension 07/29/2017   Embolus of superior mesenteric artery ([place])    Abdominal pain    Occlusive mesenteric ischemia ([place]) 06/19/2017   Acute focal ischemia of small intestine ([place])    Acute right-sided low back pain with right-sided sciatica 06/13/2017   Osteoarthritis of knee (Bilateral) 06/13/2017   Chronic pain syndrome 09/13/2016   Morbid obesity with BMI of 40.0-44.9, adult ([place]) 07/16/2016   Personal history of other malignant neoplasm of skin 06/01/2016   Chronic knee pain (Primary Area of Pain) (Bilateral) 01/19/2016   Essential hypertension 01/19/2016   GERD (gastroesophageal reflux disease) 01/19/2016   Mixed hyperlipidemia 01/19/2016  Prediabetes 01/19/2016   Recurrent major depressive disorder ([place]) 01/19/2016   Chronic pelvic pain in female 11/14/2013    Mixed stress and urge urinary incontinence 11/14/2013   Chest pain 08/06/2012    Orientation RESPIRATION BLADDER Height & Weight     Self, Time, Situation, Place  Normal Continent Weight: 97.5 kg Height:  '5\' 2"'$  (157.5 cm)  BEHAVIORAL SYMPTOMS/MOOD NEUROLOGICAL BOWEL NUTRITION STATUS      Continent Diet (See DC summary)  AMBULATORY STATUS COMMUNICATION OF NEEDS Skin   Extensive Assist Verbally Normal, Bruising (bruising on ABdomen)                       Personal Care Assistance Level of Assistance  Bathing, Feeding, Dressing Bathing Assistance: Maximum assistance Feeding assistance: Limited assistance Dressing Assistance: Maximum assistance     Functional Limitations Info  Sight, Hearing, Speech Sight Info: Adequate Hearing Info: Adequate Speech Info: Adequate    SPECIAL CARE FACTORS FREQUENCY  PT (By licensed PT), OT (By licensed OT)     PT Frequency: 5 times per week OT Frequency: 5 times per week            Contractures Contractures Info: Not present    Additional Factors Info  Code Status, Allergies Code Status Info: Full code Allergies Info: Bactrim, Gabapentin           Current Medications (09/08/2022):  This is the current hospital active medication list Current Facility-Administered Medications  Medication Dose Route Frequency Provider Last Rate Last Admin   acetaminophen (TYLENOL) tablet 650 mg  650 mg Oral Q6H PRN Athena Masse, MD       Or   acetaminophen (TYLENOL) suppository 650 mg  650 mg Rectal Q6H PRN Athena Masse, MD       aspirin EC tablet 81 mg  81 mg Oral Daily Judd Gaudier V, MD       atorvastatin (LIPITOR) tablet 80 mg  80 mg Oral QHS Judd Gaudier V, MD   80 mg at 09/08/22 0140   bisacodyl (DULCOLAX) EC tablet 5 mg  5 mg Oral Daily PRN Athena Masse, MD       citalopram (CELEXA) tablet 40 mg  40 mg Oral Daily Athena Masse, MD       HYDROcodone-acetaminophen (NORCO/VICODIN) 5-325 MG per tablet 1-2 tablet  1-2 tablet Oral  Q4H PRN Athena Masse, MD   2 tablet at 09/08/22 0140   [START ON 09/09/2022] influenza vac split quadrivalent PF (FLUARIX) injection 0.5 mL  0.5 mL Intramuscular Tomorrow-1000 Judd Gaudier V, MD       morphine (MS CONTIN) 12 hr tablet 15 mg  15 mg Oral Q12H Judd Gaudier V, MD   15 mg at 09/08/22 0140   morphine (PF) 2 MG/ML injection 2 mg  2 mg Intravenous Q2H PRN Athena Masse, MD       ondansetron [facility]) tablet 4 mg  4 mg Oral Q6H PRN Athena Masse, MD       Or   ondansetron [facility]) injection 4 mg  4 mg Intravenous Q6H PRN Athena Masse, MD       pantoprazole (PROTONIX) EC tablet 40 mg  40 mg Oral Daily Athena Masse, MD       polyethylene glycol (MIRALAX / GLYCOLAX) packet 17 g  17 g Oral Daily Athena Masse, MD   17 g at 09/08/22 0140     Discharge Medications: Please see discharge summary for a  list of discharge medications.  Relevant Imaging Results:  Relevant Lab Results:   Additional Information SS#- [id number]  Conception Oms, RN

## 2022-09-08 NOTE — Hospital Course (Signed)
Carla [name] is a 61 y.o. female with medical history significant for CAD,PAD status post right AKA complicated by stump abscess s/p treatment ,left axillary femoral bypass graft, diastolic CHF, COPD, chronic mesenteric ischemia, and atrial fibrillation on Eliquis, hypertension, , depression, CAD, obesity, chronic pain, recently hospitalized from 11/4-11/9 for I&D of a left thigh hematoma and discharged to SNF, who was sent in for evaluation of a 5-day history of lower abdominal pain.  Patient denies nausea, vomiting or diarrhea and denies fevers or chills.  She denies chest pain or shortness of breath.  11/16:  BP 95/56 on arrival, fluid responsive to 107/83 by admission.  Vitals otherwise within normal limits.  Labs with normal WBC with lactic acid 1.2.  Potassium 2.7.  Hemoglobin 9.6 which is above baseline 9.8-10.4.  EKG, personally viewed and interpreted showing sinus rhythm at 71 with no ischemic ST-T wave changes.  CT abdomen and pelvis showed a large RLQ ventral hernia with multiple loops of large and small bowel without incarceration or obstruction as well as significant fecal retention throughout the colon consistent with constipation. Admitted for hypotension and eval abdominal pain. Lactate trended up some. Unable to get CT w/ contrast d/t inadequate venous access. 11/17:  General surgery consulted -  confirmed that lower abdominal pain most likely related to constipation / stool burden.   Consultants:  General Surgery  **Vascular Surgery   Procedures: None       ASSESSMENT & PLAN:   Principal Problem:   Hypotension Active Problems:   Abdominal pain   Hypokalemia   Chronic mesenteric ischemia (HCC)   Chronic anticoagulation   PAD (peripheral artery disease) (HCC)   CAD (coronary artery disease)   Chronic diastolic CHF (congestive heart failure) (HCC)   PAF (paroxysmal atrial fibrillation) (HCC)   COPD (chronic obstructive pulmonary disease) ([place])   Essential hypertension    Ventral hernia with obstruction and without gangrene   Anxiety and depression   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)   Abdominal pain likely d/t constipation, less likely mesenteric ischemia / bowel obstruction Ventral hernia without obstruction or gangrene Constipation, Fecal retention 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  No strong evidence for sepsis at this time IV hydration Continue to monitor Hold antihypertensives  Hypokalemia - improved IV repletion given in the ED Monitor and replete as necessary  COPD (chronic obstructive pulmonary disease) ([place]) Not acutely exacerbated Continue home inhalers.   DuoNebs as needed  PAF (paroxysmal atrial fibrillation) (HCC) Currently rate controlled Continue apixaban Holding metoprolol due to hypotension  Chronic diastolic CHF (congestive heart failure) ([place]) Patient is euvolemic to dry Holding all BP lowering meds including furosemide, isosorbide, metolazone and metoprolol Daily weights  CAD (coronary artery disease) No complaints of chest pain and EKG is nonacute Continue aspirin and atorvastatin.   Holding metoprolol due to hypotension  Anxiety and depression Continue Celexa    DVT prophylaxis: Eliquis Pertinent IV fluids/nutrition: no continuous IV fluids at this time  Central lines / invasive devices: none  Code Status: FULL CODE  Disposition: observation  TOC needs: back to SNF  Barriers  to discharge / significant pending items: surgical consult, bowel regimen,  anticipate discharge later today or tomorrow

## 2022-09-08 NOTE — TOC Progression Note (Signed)
Transition of Care [facility]) - Progression Note    Patient Details  Name: Carla [name] MRN: [id number] Date of Birth: 03-Aug-1961  [facility] CM/SW [place], RN Phone Number: 09/08/2022, 11:57 AM  Clinical Narrative:    Met with the patient in the room, she stated that her plan is to return to [facility], I called and confirmed with Audry Pili that she can return today I explained to the patient that she is Observation, she stated understanding and stated she will be going back today   Expected Discharge Plan: Skilled Nursing Facility Barriers to Discharge: Barriers Resolved  Expected Discharge Plan and Services Expected Discharge Plan: [place]                                               Social Determinants of Health (SDOH) Interventions    Readmission Risk Interventions    07/25/2022   10:49 AM 05/05/2022   10:52 AM 03/24/2022    9:51 AM  Readmission Risk Prevention Plan  Transportation Screening Complete Complete Complete  PCP or Specialist Appt within 3-5 Days   Complete  HRI or Home Care Consult   Complete  Social Work Consult for [place] Planning/Counseling   Complete  Palliative Care Screening   Not Applicable  Medication Review Press photographer) Complete Complete Complete  PCP or Specialist appointment within 3-5 days of discharge Complete Complete   HRI or Home Care Consult Complete    SW Recovery Care/Counseling Consult Not Complete Complete   SW Consult Not Complete Comments NA    Palliative Care Screening  Not [place] Patient Refused Patient Refused

## 2022-09-19 ENCOUNTER — Encounter (INDEPENDENT_AMBULATORY_CARE_PROVIDER_SITE_OTHER): Payer: Self-pay

## 2022-09-19 ENCOUNTER — Encounter (INDEPENDENT_AMBULATORY_CARE_PROVIDER_SITE_OTHER): Payer: Medicare Other

## 2022-09-29 ENCOUNTER — Ambulatory Visit (INDEPENDENT_AMBULATORY_CARE_PROVIDER_SITE_OTHER): Payer: Medicare Other | Admitting: Nurse Practitioner

## 2022-10-04 ENCOUNTER — Ambulatory Visit (INDEPENDENT_AMBULATORY_CARE_PROVIDER_SITE_OTHER): Payer: Medicare Other | Admitting: Nurse Practitioner

## 2022-10-04 ENCOUNTER — Encounter (INDEPENDENT_AMBULATORY_CARE_PROVIDER_SITE_OTHER): Payer: Self-pay | Admitting: Nurse Practitioner

## 2022-10-04 VITALS — BP 104/60 | HR 69 | Resp 16

## 2022-10-04 DIAGNOSIS — M79604 Pain in right leg: Secondary | ICD-10-CM

## 2022-10-04 MED ORDER — HYDROCODONE-ACETAMINOPHEN 5-325 MG PO TABS: 1.0000 | ORAL_TABLET | ORAL | 0 refills | Status: DC | PRN

## 2022-10-04 MED ORDER — PREGABALIN 200 MG PO CAPS: 200.0000 mg | ORAL_CAPSULE | Freq: Three times a day (TID) | ORAL | 3 refills | Status: DC

## 2022-10-17 ENCOUNTER — Encounter (INDEPENDENT_AMBULATORY_CARE_PROVIDER_SITE_OTHER): Payer: Self-pay | Admitting: Nurse Practitioner

## 2022-10-17 NOTE — Progress Notes (Signed)
Subjective:    Patient ID: Carla [name], female    DOB: Jul 19, 1961, 61 y.o.   MRN: [id number] Chief Complaint  Patient presents with   Follow-up    Wound check    Carla [name] is a 61 year old female who presents today as a return following a recent debridement of her right above-knee amputation site.  She was found to have a hematoma which was causing her significant pain.  Since that time the hematoma has been removed and she is tolerating wound treatments well.    Review of Systems  Skin:  Positive for wound.  All other systems reviewed and are negative.      Objective:   Physical Exam Vitals reviewed.  HENT:     Head: Normocephalic.  Cardiovascular:     Rate and Rhythm: Normal rate.  Pulmonary:     Effort: Pulmonary effort is normal.  Musculoskeletal:     Right Lower Extremity: Right leg is amputated above knee.  Skin:    General: Skin is warm and dry.  Neurological:     Mental Status: She is alert and oriented to person, place, and time.  Psychiatric:        Mood and Affect: Mood normal.        Behavior: Behavior normal.        Thought Content: Thought content normal.        Judgment: Judgment normal.     BP 104/60 (BP Location: Left Arm)   Pulse 69   Resp 16   Past Medical History:  Diagnosis Date   Abdominal aortic atherosclerosis ([place])    a. 05/2017 CTA abd/pelvis: significant atherosclerotic dzs of the infrarenal abd Ao w/ some mural thrombus. No aneurysm or dissection.   Acute focal ischemia of small intestine (HCC)    Acute right-sided low back pain with right-sided sciatica 06/13/2017   AKI (acute kidney injury) ([name]) 07/29/2017   Anemia    Anginal pain ([place])    a. 08/2012 Lexiscan MV: EF 54%, non ischemia/infarct.   Anxiety and depression    Baker's cyst of knee, right    a. 07/2016 U/S: 4.1 x 1.4 x 2.9 cystic structure in R poplitetal fossa.   Bell's palsy    CAD (coronary artery disease)    CHF (congestive heart failure) ([place])    a.) TTE  06/20/2017: EF 60-65%, no rwma, G1DD, no source of cardiac emboli.   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 05/2017 CTA Abd/pelvis: apparent thrombus or embolus in prox SMA (70-90%); b. 05/2017 catheter directed tPA, mechanical thrombectomy, and stenting of the SMA.   Essential hypertension with goal blood pressure less than 140/90 01/19/2016   Gastroesophageal reflux disease 01/19/2016   GERD (gastroesophageal reflux disease)    H/O colectomy    History of 2019 novel coronavirus disease (COVID-19) 11/14/2019   History of kidney stones    Hyperlipidemia    Hypertension    Morbid obesity with BMI of 40.0-44.9, adult ([place])    NSTEMI (non-ST elevated myocardial infarction) ([place]) 06/27/2021   a.) high sensitivity troponins trended: 893 --> 1587 --> 1748 --> 868 --> 735 ng/L   Occlusive mesenteric ischemia ([place]) 06/19/2017   Osteoarthritis    PAD (peripheral artery disease) ([place])  PAF (paroxysmal atrial fibrillation) (HCC)    a.) CHA2DS2-VASc = 6 (sex, CHF, HTN, DVT x2, aortic plaque). b.) rate/rhythm maintained on oral metoprolol tartrate; chronically anticoagulated with standard dose apixaban.   Palpitations    Peripheral edema    Personal history of other malignant neoplasm of skin 06/01/2016   Pneumonia    Pre-diabetes    Primary osteoarthritis of both knees 06/13/2017   SBO (small bowel obstruction) ([place]) 08/06/2017   Skin cancer of nose    Superior mesenteric artery thrombosis (HCC)    Vitamin D deficiency     Social History   Socioeconomic History   Marital status: Single    Spouse name: Not on file   Number of children: Not on file   Years of education: Not on file   Highest education level: Not on file  Occupational History   Occupation: Unemployed  Tobacco Use    Smoking status: Former    Packs/day: 1.00    Types: Cigarettes    Quit date: 2023    Years since quitting: 0.9   Smokeless tobacco: Never  Vaping Use   Vaping Use: Never used  Substance and Sexual Activity   Alcohol use: No   Drug use: No   Sexual activity: Not on file  Other Topics Concern   Not on file  Social History Narrative   Lives in [place] with daughter   Social Determinants of Health   Financial Resource Strain: Not on file  Food Insecurity: No Food Insecurity (09/08/2022)   Hunger Vital Sign    Worried About Running Out of Food in the Last Year: Never true    Ran Out of Food in the Last Year: Never true  Transportation Needs: No Transportation Needs (09/08/2022)   PRAPARE - Hydrologist (Medical): No    Lack of Transportation (Non-Medical): No  Physical Activity: Not on file  Stress: Not on file  Social Connections: Not on file  Intimate Partner Violence: Not At Risk (09/08/2022)   Humiliation, Afraid, Rape, and Kick questionnaire    Fear of Current or Ex-Partner: No    Emotionally Abused: No    Physically Abused: No    Sexually Abused: No    Past Surgical History:  Procedure Laterality Date   AMPUTATION Right 04/06/2022   Procedure: AMPUTATION ABOVE KNEE;  Surgeon: Algernon Huxley, MD;  Location: [facility];  Service: Vascular;  Laterality: Right;   AMPUTATION TOE Left 12/09/2019   Procedure: AMPUTATION TOE MPJ left;  Surgeon: Caroline More, DPM;  Location: [facility];  Service: Podiatry;  Laterality: Left;   APPENDECTOMY     APPLICATION OF WOUND VAC  12/01/2021   Procedure: APPLICATION OF WOUND VAC;  Surgeon: Algernon Huxley, MD;  Location: [facility];  Service: Vascular;;   APPLICATION OF WOUND VAC Right 05/03/2022   Procedure: APPLICATION OF WOUND VAC;  Surgeon: Algernon Huxley, MD;  Location: [facility];  Service: Vascular;  Laterality: Right;   APPLICATION OF WOUND VAC Left 07/23/2022   Procedure: APPLICATION OF WOUND VAC;  Surgeon:  Serafina Mitchell, MD;  Location: [facility];  Service: Vascular;  Laterality: Left;  [place]   APPLICATION OF WOUND VAC Right 08/28/2022   Procedure: APPLICATION OF WOUND VAC;  Surgeon: Algernon Huxley, MD;  Location: [facility];  Service: Vascular;  Laterality: Right;   AXILLARY-FEMORAL BYPASS GRAFT Left 07/23/2022   Procedure: BYPASS GRAFT AXILLA-BIFEMORAL;  Surgeon: Serafina Mitchell, MD;  Location: [facility];  Service:  Vascular;  Laterality: Left;   CHOLECYSTECTOMY     COLON SURGERY     COLONOSCOPY WITH PROPOFOL N/A 03/23/2022   Procedure: COLONOSCOPY WITH PROPOFOL;  Surgeon: Jonathon Bellows, MD;  Location: [facility];  Service: Gastroenterology;  Laterality: N/A;  rectal bleed   EMBOLECTOMY OR THROMBECTOMY, WITH OR WITHOUT CATHETER; FEMOROPOPLITEAL, AORTOILIAC ARTERY, BY LEG INCISION N/A 06/21/2020   Left - Decomp Fasciotomy Leg; Ant &/Or Lat Compart Only; Midline - Angiography, Visceral, Selective Or Supraselective (With Or Without Flush Aortogram); Left - Revascularize, Endovasc, Open/Percut, Iliac Artery, Unilat, Initial Vessel; W/Translum Stent, W/Angioplasty; Right - Revascularize, Endovasc, Open/Percut, Iliac Artery, Ea Add`L Ipsilateral; W/Translumin Stent, W/Angioplasty; Location [facility];   ENDARTERECTOMY FEMORAL Left 12/01/2021   Procedure: ENDARTERECTOMY FEMORAL;  Surgeon: Algernon Huxley, MD;  Location: [facility];  Service: Vascular;  Laterality: Left;   ENDOVASCULAR REPAIR/STENT GRAFT Left 12/01/2021   Procedure: ENDOVASCULAR REPAIR/STENT GRAFT;  Surgeon: Algernon Huxley, MD;  Location: [place] [facility];  Service: Cardiovascular;  Laterality: Left;   INCISION AND DRAINAGE OF WOUND Right 05/03/2022   Procedure: IRRIGATION AND DEBRIDEMENT RIGHT STUMP;  Surgeon: Algernon Huxley, MD;  Location: [facility];  Service: Vascular;  Laterality: Right;   IRRIGATION AND DEBRIDEMENT ABSCESS Right 08/28/2022   Procedure: AKA IRRIGATION AND DEBRIDEMENT ABSCESS;  Surgeon: Algernon Huxley, MD;  Location: [facility];  Service:  Vascular;  Laterality: Right;   LAPAROTOMY N/A 08/08/2017   Procedure: EXPLORATORY LAPAROTOMY POSSIBLE BOWEL RESECTION;  Surgeon: Jules Husbands, MD;  Location: [facility];  Service: General;  Laterality: N/A;   LEFT HEART CATH AND CORONARY ANGIOGRAPHY N/A 05/17/2021   Procedure: LEFT HEART CATH AND CORONARY ANGIOGRAPHY;  Surgeon: Yolonda Kida, MD;  Location: [place] [facility];  Service: Cardiovascular;  Laterality: N/A;   LOWER EXTREMITY ANGIOGRAPHY Left 11/17/2019   Procedure: LOWER EXTREMITY ANGIOGRAPHY;  Surgeon: Algernon Huxley, MD;  Location: [place] [facility];  Service: Cardiovascular;  Laterality: Left;   LOWER EXTREMITY ANGIOGRAPHY Right 12/13/2020   Procedure: LOWER EXTREMITY ANGIOGRAPHY;  Surgeon: Algernon Huxley, MD;  Location: [place] [facility];  Service: Cardiovascular;  Laterality: Right;   LOWER EXTREMITY ANGIOGRAPHY Right 01/10/2021   Procedure: LOWER EXTREMITY ANGIOGRAPHY;  Surgeon: Algernon Huxley, MD;  Location: [place] [facility];  Service: Cardiovascular;  Laterality: Right;   LOWER EXTREMITY ANGIOGRAPHY Right 01/24/2021   Procedure: LOWER EXTREMITY ANGIOGRAPHY;  Surgeon: Algernon Huxley, MD;  Location: [place] [facility];  Service: Cardiovascular;  Laterality: Right;   LOWER EXTREMITY ANGIOGRAPHY Bilateral 06/28/2021   Procedure: Lower Extremity Angiography;  Surgeon: Algernon Huxley, MD;  Location: [place] [facility];  Service: Cardiovascular;  Laterality: Bilateral;   LOWER EXTREMITY ANGIOGRAPHY Right 11/28/2021   Procedure: LOWER EXTREMITY ANGIOGRAPHY;  Surgeon: Algernon Huxley, MD;  Location: [place] [facility];  Service: Cardiovascular;  Laterality: Right;   LOWER EXTREMITY ANGIOGRAPHY Left 11/30/2021   Procedure: Lower Extremity Angiography;  Surgeon: Algernon Huxley, MD;  Location: [place] [facility];  Service: Cardiovascular;  Laterality: Left;   LOWER EXTREMITY ANGIOGRAPHY Right 02/22/2022   Procedure: Lower Extremity Angiography;  Surgeon: Algernon Huxley, MD;  Location:  [place] [facility];  Service: Cardiovascular;  Laterality: Right;   LOWER EXTREMITY ANGIOGRAPHY Right 03/13/2022   Procedure: Lower Extremity Angiography;  Surgeon: Algernon Huxley, MD;  Location: [place] [facility];  Service: Cardiovascular;  Laterality: Right;   LOWER EXTREMITY ANGIOGRAPHY Right 03/24/2022   Procedure: Lower Extremity Angiography;  Surgeon:  Algernon Huxley, MD;  Location: [place] [facility];  Service: Cardiovascular;  Laterality: Right;   LOWER EXTREMITY INTERVENTION N/A 11/19/2019   Procedure: LOWER EXTREMITY INTERVENTION;  Surgeon: Algernon Huxley, MD;  Location: [place] [facility];  Service: Cardiovascular;  Laterality: N/A;   LOWER EXTREMITY INTERVENTION Bilateral 06/29/2021   Procedure: LOWER EXTREMITY INTERVENTION;  Surgeon: Algernon Huxley, MD;  Location: [place] [facility];  Service: Cardiovascular;  Laterality: Bilateral;   TEE WITHOUT CARDIOVERSION N/A 06/22/2017   Procedure: TRANSESOPHAGEAL ECHOCARDIOGRAM (TEE);  Surgeon: Wellington Hampshire, MD;  Location: [facility];  Service: Cardiovascular;  Laterality: N/A;   TOTAL KNEE ARTHROPLASTY Right 07/11/2018   Procedure: TOTAL KNEE ARTHROPLASTY;  Surgeon: Corky Mull, MD;  Location: [facility];  Service: Orthopedics;  Laterality: Right;   VAGINAL HYSTERECTOMY     VISCERAL ARTERY INTERVENTION N/A 06/20/2017   Procedure: Visceral Artery Intervention, possible aortic thrombectomy;  Surgeon: Algernon Huxley, MD;  Location: [place] [facility];  Service: Cardiovascular;  Laterality: N/A;   VISCERAL ARTERY INTERVENTION N/A 01/28/2018   Procedure: VISCERAL ARTERY INTERVENTION;  Surgeon: Algernon Huxley, MD;  Location: [place] [facility];  Service: Cardiovascular;  Laterality: N/A;    Family History  Problem Relation Age of Onset   Hypertension Mother    Heart disease Mother    Heart attack Mother    Breast cancer Mother    Hypertension Father    Breast cancer Paternal Aunt     Allergies  Allergen Reactions   Gabapentin      Tremors    Bactrim [Sulfamethoxazole-Trimethoprim] Itching       Latest Ref Rng & Units 09/08/2022    6:00 AM 09/07/2022    4:21 PM 08/31/2022    4:11 AM  CBC  WBC 4.0 - 10.5 K/uL 7.5  8.6  7.8   Hemoglobin 12.0 - 15.0 g/dL 9.5  9.6  10.4   Hematocrit 36.0 - 46.0 % 29.7  30.4  32.9   Platelets 150 - 400 K/uL 238  236  212       CMP     Component Value Date/Time   NA 138 09/08/2022 0600   NA 140 02/12/2015 1137   K 3.1 (L) 09/08/2022 0600   K 2.9 (L) 02/12/2015 1137   CL 98 09/08/2022 0600   CL 107 02/12/2015 1137   CO2 33 (H) 09/08/2022 0600   CO2 27 02/12/2015 1137   GLUCOSE 106 (H) 09/08/2022 0600   GLUCOSE 120 (H) 02/12/2015 1137   BUN 20 09/08/2022 0600   BUN 15 02/12/2015 1137   CREATININE 0.61 09/08/2022 0600   CREATININE 0.78 02/12/2015 1137   CALCIUM 8.6 (L) 09/08/2022 0600   CALCIUM 9.0 02/12/2015 1137   PROT 6.4 (L) 09/08/2022 0600   PROT 7.0 09/14/2020 1139   PROT 7.1 01/19/2015 1112   ALBUMIN 2.5 (L) 09/08/2022 0600   ALBUMIN 3.9 09/14/2020 1139   ALBUMIN 3.8 01/19/2015 1112   AST 15 09/08/2022 0600   AST 17 01/19/2015 1112   ALT 16 09/08/2022 0600   ALT 11 (L) 01/19/2015 1112   ALKPHOS 111 09/08/2022 0600   ALKPHOS 84 01/19/2015 1112   BILITOT 0.5 09/08/2022 0600   BILITOT <0.2 09/14/2020 1139   BILITOT 0.5 01/19/2015 1112   GFRNONAA >60 09/08/2022 0600   GFRNONAA >60 02/12/2015 1137   GFRAA >60 11/23/2019 1330   GFRAA >60 02/12/2015 1137     No results found.     Assessment & Plan:  1. Right leg pain Patient is doing well post removal of hematoma.  The wound is decreasing in size.  Discussed with patient that she needs to be sure that her home health attendant is packing the wound and not just placing the sponge on top as there is some significant tunneling noted.  Will have the patient return in 2 weeks for wound reevaluation.   Current Outpatient Medications on File Prior to Visit  Medication Sig Dispense Refill   alum & mag  hydroxide-simeth (MAALOX/MYLANTA) 200-200-20 MG/5ML suspension Take 15-30 mLs by mouth every 2 (two) hours as needed for indigestion. 355 mL 0   apixaban (ELIQUIS) 5 MG TABS tablet Take 1 tablet (5 mg total) by mouth 2 (two) times daily. 60 tablet 11   aspirin EC 81 MG tablet Take 81 mg by mouth daily. Swallow whole.     atorvastatin (LIPITOR) 80 MG tablet Take 80 mg by mouth at bedtime.     bisacodyl (DULCOLAX) 5 MG EC tablet Take 1 tablet (5 mg total) by mouth daily as needed for moderate constipation. 30 tablet 0   cefTRIAXone (ROCEPHIN) 1 g injection Inject 1 g into the muscle daily.     COMBIVENT RESPIMAT 20-100 MCG/ACT AERS respimat Inhale 1 puff into the lungs every 6 (six) hours as needed.     esomeprazole (NEXIUM) 20 MG capsule Take 20 mg by mouth daily.     feeding supplement (ENSURE ENLIVE / ENSURE PLUS) LIQD Take 237 mLs by mouth 2 (two) times daily between meals. 237 mL 12   ferrous sulfate 325 (65 FE) MG tablet Take 325 mg by mouth daily.     furosemide (LASIX) 40 MG tablet Take 0.5 tablets (20 mg total) by mouth daily. 30 tablet    isosorbide mononitrate (IMDUR) 60 MG 24 hr tablet Take 60 mg by mouth daily.     lidocaine (LIDODERM) 5 % Place 1 patch onto the skin daily. Remove & Discard patch within 12 hours or as directed by MD 30 patch 0   metoprolol tartrate (LOPRESSOR) 25 MG tablet Take 0.5 tablets (12.5 mg total) by mouth 2 (two) times daily. Hold if SBP is 100 or less or HR is less than 60. Notify Provider     morphine (MS CONTIN) 15 MG 12 hr tablet Take 1 tablet (15 mg total) by mouth every 12 (twelve) hours. 10 tablet 0   Multiple Vitamin (MULTIVITAMIN WITH MINERALS) TABS tablet Take 1 tablet by mouth daily. 30 tablet 0   naloxegol oxalate (MOVANTIK) 12.5 MG TABS tablet Take 1 tablet (12.5 mg total) by mouth daily. 30 tablet 0   ondansetron (ZOFRAN) 4 MG tablet Take 4 mg by mouth every 8 (eight) hours as needed for nausea or vomiting.     polyethylene glycol (MIRALAX /  GLYCOLAX) 17 g packet Take 17 g by mouth daily. 14 each 0   citalopram (CELEXA) 40 MG tablet Take 1 tablet (40 mg total) by mouth daily. 30 tablet 2   No current facility-administered medications on file prior to visit.    There are no Patient Instructions on file for this visit. No follow-ups on file.   Kris Hartmann, NP

## 2022-10-18 ENCOUNTER — Other Ambulatory Visit (INDEPENDENT_AMBULATORY_CARE_PROVIDER_SITE_OTHER): Payer: Self-pay | Admitting: Nurse Practitioner

## 2022-10-18 ENCOUNTER — Telehealth (INDEPENDENT_AMBULATORY_CARE_PROVIDER_SITE_OTHER): Payer: Self-pay

## 2022-10-18 MED ORDER — HYDROCODONE-ACETAMINOPHEN 5-325 MG PO TABS: 1.0000 | ORAL_TABLET | ORAL | 0 refills | Status: DC | PRN

## 2022-10-18 NOTE — Telephone Encounter (Signed)
Patient made aware with medical advice and verbalized understanding. Patient informed that she has contacted her cardiologist and has been schedule for upcoming appointment.

## 2022-10-18 NOTE — Telephone Encounter (Signed)
Refill sent.  And yes patient does need to consult cardiology regarding chest pain as well as the refill for nitroglycerin.

## 2022-10-20 ENCOUNTER — Encounter (INDEPENDENT_AMBULATORY_CARE_PROVIDER_SITE_OTHER): Payer: Self-pay | Admitting: Nurse Practitioner

## 2022-10-20 ENCOUNTER — Ambulatory Visit (INDEPENDENT_AMBULATORY_CARE_PROVIDER_SITE_OTHER): Payer: Medicare Other | Admitting: Nurse Practitioner

## 2022-10-20 VITALS — BP 117/78 | HR 69 | Resp 16

## 2022-10-20 DIAGNOSIS — Z89611 Acquired absence of right leg above knee: Secondary | ICD-10-CM

## 2022-10-20 NOTE — Progress Notes (Signed)
Patient came for wound check from right stump. Patient wound is healing and depth with wound has decrease. Wound vac was applied back on and patient will follow up in 2 weeks for wound check with Dr Lucky Cowboy or Arna Medici NP.

## 2022-10-30 ENCOUNTER — Telehealth (INDEPENDENT_AMBULATORY_CARE_PROVIDER_SITE_OTHER): Payer: Self-pay | Admitting: Nurse Practitioner

## 2022-10-30 ENCOUNTER — Other Ambulatory Visit (INDEPENDENT_AMBULATORY_CARE_PROVIDER_SITE_OTHER): Payer: Self-pay | Admitting: Nurse Practitioner

## 2022-10-30 MED ORDER — HYDROCODONE-ACETAMINOPHEN 5-325 MG PO TABS: 1.0000 | ORAL_TABLET | ORAL | 0 refills | Status: DC | PRN

## 2022-10-30 NOTE — Telephone Encounter (Signed)
Her refill is sent

## 2022-10-30 NOTE — Telephone Encounter (Signed)
Patient called and stated she was out of her HYDROcodone-acetaminophen (NORCO/VICODIN) 5-325 MG tablet [[id number]]  and her leg is hurting very bad.  Please advise.

## 2022-11-03 ENCOUNTER — Ambulatory Visit (INDEPENDENT_AMBULATORY_CARE_PROVIDER_SITE_OTHER): Payer: Medicare Other | Admitting: Nurse Practitioner

## 2022-11-03 ENCOUNTER — Ambulatory Visit (INDEPENDENT_AMBULATORY_CARE_PROVIDER_SITE_OTHER): Payer: 59 | Admitting: Nurse Practitioner

## 2022-11-03 ENCOUNTER — Encounter (INDEPENDENT_AMBULATORY_CARE_PROVIDER_SITE_OTHER): Payer: Self-pay | Admitting: Nurse Practitioner

## 2022-11-03 VITALS — BP 157/92 | HR 64 | Resp 16

## 2022-11-03 DIAGNOSIS — Z89611 Acquired absence of right leg above knee: Secondary | ICD-10-CM

## 2022-11-03 MED ORDER — CIPROFLOXACIN HCL 250 MG PO TABS: 750.0000 mg | ORAL_TABLET | Freq: Two times a day (BID) | ORAL | 0 refills | Status: DC

## 2022-11-07 ENCOUNTER — Telehealth (INDEPENDENT_AMBULATORY_CARE_PROVIDER_SITE_OTHER): Payer: Self-pay

## 2022-11-07 NOTE — Telephone Encounter (Signed)
Patient called to let us know the name of home health was [facility], [phone].  Malachy Mood is her home health nurse.

## 2022-11-07 NOTE — Telephone Encounter (Signed)
I spoke with Tiffany from [facility] and she informed that patient concerns has been address. The office has change nurses and lpn will be going out to the patient home to do wound vac changes.

## 2022-11-09 ENCOUNTER — Telehealth (INDEPENDENT_AMBULATORY_CARE_PROVIDER_SITE_OTHER): Payer: Self-pay

## 2022-11-09 ENCOUNTER — Other Ambulatory Visit (INDEPENDENT_AMBULATORY_CARE_PROVIDER_SITE_OTHER): Payer: Self-pay | Admitting: Nurse Practitioner

## 2022-11-09 MED ORDER — HYDROCODONE-ACETAMINOPHEN 5-325 MG PO TABS: 1.0000 | ORAL_TABLET | ORAL | 0 refills | Status: DC | PRN

## 2022-11-09 NOTE — Telephone Encounter (Signed)
Patient notified medication has been sent

## 2022-11-09 NOTE — Telephone Encounter (Signed)
sent

## 2022-11-17 ENCOUNTER — Ambulatory Visit (INDEPENDENT_AMBULATORY_CARE_PROVIDER_SITE_OTHER): Payer: 59 | Admitting: Nurse Practitioner

## 2022-11-17 ENCOUNTER — Other Ambulatory Visit (INDEPENDENT_AMBULATORY_CARE_PROVIDER_SITE_OTHER): Payer: Self-pay | Admitting: Nurse Practitioner

## 2022-11-17 MED ORDER — HYDROCODONE-ACETAMINOPHEN 5-325 MG PO TABS: 1.0000 | ORAL_TABLET | ORAL | 0 refills | Status: DC | PRN

## 2022-11-19 ENCOUNTER — Encounter (INDEPENDENT_AMBULATORY_CARE_PROVIDER_SITE_OTHER): Payer: Self-pay | Admitting: Nurse Practitioner

## 2022-11-19 NOTE — Progress Notes (Signed)
Subjective:    Patient ID: Carla [name], female    DOB: Jun 28, 1961, 62 y.o.   MRN: [id number] Chief Complaint  Patient presents with   Follow-up    2 week wound check    Carla [name] is a 62 year old female who presents today as a return following a recent debridement of her right above-knee amputation site.  She was found to have a hematoma which was causing her significant pain.  Since that time the hematoma has been removed and she is tolerating wound treatments well.    Review of Systems  Skin:  Positive for wound.  All other systems reviewed and are negative.      Objective:   Physical Exam Vitals reviewed.  HENT:     Head: Normocephalic.  Cardiovascular:     Rate and Rhythm: Normal rate.  Pulmonary:     Effort: Pulmonary effort is normal.  Musculoskeletal:     Right Lower Extremity: Right leg is amputated above knee.  Skin:    General: Skin is warm and dry.  Neurological:     Mental Status: She is alert and oriented to person, place, and time.  Psychiatric:        Mood and Affect: Mood normal.        Behavior: Behavior normal.        Thought Content: Thought content normal.        Judgment: Judgment normal.     BP (!) 157/92 (BP Location: Right Arm)   Pulse 64   Resp 16   Past Medical History:  Diagnosis Date   Abdominal aortic atherosclerosis ([place])    a. 05/2017 CTA abd/pelvis: significant atherosclerotic dzs of the infrarenal abd Ao w/ some mural thrombus. No aneurysm or dissection.   Acute focal ischemia of small intestine (HCC)    Acute right-sided low back pain with right-sided sciatica 06/13/2017   AKI (acute kidney injury) ([place]) 07/29/2017   Anemia    Anginal pain ([place])    a. 08/2012 Lexiscan MV: EF 54%, non ischemia/infarct.   Anxiety and depression    Baker's cyst of knee, right    a. 07/2016 U/S: 4.1 x 1.4 x 2.9 cystic structure in R poplitetal fossa.   Bell's palsy    CAD (coronary artery disease)    CHF (congestive heart failure) ([place])     a.) TTE 06/20/2017: EF 60-65%, no rwma, G1DD, no source of cardiac emboli.   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 05/2017 CTA Abd/pelvis: apparent thrombus or embolus in prox SMA (70-90%); b. 05/2017 catheter directed tPA, mechanical thrombectomy, and stenting of the SMA.   Essential hypertension with goal blood pressure less than 140/90 01/19/2016   Gastroesophageal reflux disease 01/19/2016   GERD (gastroesophageal reflux disease)    H/O colectomy    History of 2019 novel coronavirus disease (COVID-19) 11/14/2019   History of kidney stones    Hyperlipidemia    Hypertension    Morbid obesity with BMI of 40.0-44.9, adult ([place])    NSTEMI (non-ST elevated myocardial infarction) ([place]) 06/27/2021   a.) high sensitivity troponins trended: 893 --> 1587 --> 1748 --> 868 --> 735 ng/L   Occlusive mesenteric ischemia ([place]) 06/19/2017   Osteoarthritis    PAD (peripheral artery disease) (  HCC)    PAF (paroxysmal atrial fibrillation) ([place])    a.) CHA2DS2-VASc = 6 (sex, CHF, HTN, DVT x2, aortic plaque). b.) rate/rhythm maintained on oral metoprolol tartrate; chronically anticoagulated with standard dose apixaban.   Palpitations    Peripheral edema    Personal history of other malignant neoplasm of skin 06/01/2016   Pneumonia    Pre-diabetes    Primary osteoarthritis of both knees 06/13/2017   SBO (small bowel obstruction) ([place]) 08/06/2017   Skin cancer of nose    Superior mesenteric artery thrombosis (HCC)    Vitamin D deficiency     Social History   Socioeconomic History   Marital status: Single    Spouse name: Not on file   Number of children: Not on file   Years of education: Not on file   Highest education level: Not on file  Occupational History   Occupation: Unemployed   Tobacco Use   Smoking status: Former    Packs/day: 1.00    Types: Cigarettes    Quit date: 2023    Years since quitting: 1.0   Smokeless tobacco: Never  Vaping Use   Vaping Use: Never used  Substance and Sexual Activity   Alcohol use: No   Drug use: No   Sexual activity: Not on file  Other Topics Concern   Not on file  Social History Narrative   Lives in [place] with daughter   Social Determinants of Health   Financial Resource Strain: Not on file  Food Insecurity: No Food Insecurity (09/08/2022)   Hunger Vital Sign    Worried About Running Out of Food in the Last Year: Never true    Ran Out of Food in the Last Year: Never true  Transportation Needs: No Transportation Needs (09/08/2022)   PRAPARE - Hydrologist (Medical): No    Lack of Transportation (Non-Medical): No  Physical Activity: Not on file  Stress: Not on file  Social Connections: Not on file  Intimate Partner Violence: Not At Risk (09/08/2022)   Humiliation, Afraid, Rape, and Kick questionnaire    Fear of Current or Ex-Partner: No    Emotionally Abused: No    Physically Abused: No    Sexually Abused: No    Past Surgical History:  Procedure Laterality Date   AMPUTATION Right 04/06/2022   Procedure: AMPUTATION ABOVE KNEE;  Surgeon: Algernon Huxley, MD;  Location: [facility];  Service: Vascular;  Laterality: Right;   AMPUTATION TOE Left 12/09/2019   Procedure: AMPUTATION TOE MPJ left;  Surgeon: Caroline More, DPM;  Location: [facility];  Service: Podiatry;  Laterality: Left;   APPENDECTOMY     APPLICATION OF WOUND VAC  12/01/2021   Procedure: APPLICATION OF WOUND VAC;  Surgeon: Algernon Huxley, MD;  Location: [facility];  Service: Vascular;;   APPLICATION OF WOUND VAC Right 05/03/2022   Procedure: APPLICATION OF WOUND VAC;  Surgeon: Algernon Huxley, MD;  Location: [facility];  Service: Vascular;  Laterality: Right;   APPLICATION OF WOUND VAC Left 07/23/2022   Procedure: APPLICATION OF WOUND  VAC;  Surgeon: Serafina Mitchell, MD;  Location: [facility];  Service: Vascular;  Laterality: Left;  [place]   APPLICATION OF WOUND VAC Right 08/28/2022   Procedure: APPLICATION OF WOUND VAC;  Surgeon: Algernon Huxley, MD;  Location: [facility];  Service: Vascular;  Laterality: Right;   AXILLARY-FEMORAL BYPASS GRAFT Left 07/23/2022   Procedure: BYPASS GRAFT AXILLA-BIFEMORAL;  Surgeon: Serafina Mitchell, MD;  Location:  [facility];  Service: Vascular;  Laterality: Left;   CHOLECYSTECTOMY     COLON SURGERY     COLONOSCOPY WITH PROPOFOL N/A 03/23/2022   Procedure: COLONOSCOPY WITH PROPOFOL;  Surgeon: Jonathon Bellows, MD;  Location: [facility];  Service: Gastroenterology;  Laterality: N/A;  rectal bleed   EMBOLECTOMY OR THROMBECTOMY, WITH OR WITHOUT CATHETER; FEMOROPOPLITEAL, AORTOILIAC ARTERY, BY LEG INCISION N/A 06/21/2020   Left - Decomp Fasciotomy Leg; Ant &/Or Lat Compart Only; Midline - Angiography, Visceral, Selective Or Supraselective (With Or Without Flush Aortogram); Left - Revascularize, Endovasc, Open/Percut, Iliac Artery, Unilat, Initial Vessel; W/Translum Stent, W/Angioplasty; Right - Revascularize, Endovasc, Open/Percut, Iliac Artery, Ea Add`L Ipsilateral; W/Translumin Stent, W/Angioplasty; Location [facility];   ENDARTERECTOMY FEMORAL Left 12/01/2021   Procedure: ENDARTERECTOMY FEMORAL;  Surgeon: Algernon Huxley, MD;  Location: [facility];  Service: Vascular;  Laterality: Left;   ENDOVASCULAR REPAIR/STENT GRAFT Left 12/01/2021   Procedure: ENDOVASCULAR REPAIR/STENT GRAFT;  Surgeon: Algernon Huxley, MD;  Location: [place] [facility];  Service: Cardiovascular;  Laterality: Left;   INCISION AND DRAINAGE OF WOUND Right 05/03/2022   Procedure: IRRIGATION AND DEBRIDEMENT RIGHT STUMP;  Surgeon: Algernon Huxley, MD;  Location: [facility];  Service: Vascular;  Laterality: Right;   IRRIGATION AND DEBRIDEMENT ABSCESS Right 08/28/2022   Procedure: AKA IRRIGATION AND DEBRIDEMENT ABSCESS;  Surgeon: Algernon Huxley, MD;  Location: [facility];   Service: Vascular;  Laterality: Right;   LAPAROTOMY N/A 08/08/2017   Procedure: EXPLORATORY LAPAROTOMY POSSIBLE BOWEL RESECTION;  Surgeon: Jules Husbands, MD;  Location: [facility];  Service: General;  Laterality: N/A;   LEFT HEART CATH AND CORONARY ANGIOGRAPHY N/A 05/17/2021   Procedure: LEFT HEART CATH AND CORONARY ANGIOGRAPHY;  Surgeon: Yolonda Kida, MD;  Location: [place] [facility];  Service: Cardiovascular;  Laterality: N/A;   LOWER EXTREMITY ANGIOGRAPHY Left 11/17/2019   Procedure: LOWER EXTREMITY ANGIOGRAPHY;  Surgeon: Algernon Huxley, MD;  Location: [place] [facility];  Service: Cardiovascular;  Laterality: Left;   LOWER EXTREMITY ANGIOGRAPHY Right 12/13/2020   Procedure: LOWER EXTREMITY ANGIOGRAPHY;  Surgeon: Algernon Huxley, MD;  Location: [place] [facility];  Service: Cardiovascular;  Laterality: Right;   LOWER EXTREMITY ANGIOGRAPHY Right 01/10/2021   Procedure: LOWER EXTREMITY ANGIOGRAPHY;  Surgeon: Algernon Huxley, MD;  Location: [place] [facility];  Service: Cardiovascular;  Laterality: Right;   LOWER EXTREMITY ANGIOGRAPHY Right 01/24/2021   Procedure: LOWER EXTREMITY ANGIOGRAPHY;  Surgeon: Algernon Huxley, MD;  Location: [place] [facility];  Service: Cardiovascular;  Laterality: Right;   LOWER EXTREMITY ANGIOGRAPHY Bilateral 06/28/2021   Procedure: Lower Extremity Angiography;  Surgeon: Algernon Huxley, MD;  Location: [place] [facility];  Service: Cardiovascular;  Laterality: Bilateral;   LOWER EXTREMITY ANGIOGRAPHY Right 11/28/2021   Procedure: LOWER EXTREMITY ANGIOGRAPHY;  Surgeon: Algernon Huxley, MD;  Location: [place] [facility];  Service: Cardiovascular;  Laterality: Right;   LOWER EXTREMITY ANGIOGRAPHY Left 11/30/2021   Procedure: Lower Extremity Angiography;  Surgeon: Algernon Huxley, MD;  Location: [place] [facility];  Service: Cardiovascular;  Laterality: Left;   LOWER EXTREMITY ANGIOGRAPHY Right 02/22/2022   Procedure: Lower Extremity Angiography;  Surgeon: Algernon Huxley, MD;   Location: [place] [facility];  Service: Cardiovascular;  Laterality: Right;   LOWER EXTREMITY ANGIOGRAPHY Right 03/13/2022   Procedure: Lower Extremity Angiography;  Surgeon: Algernon Huxley, MD;  Location: [place] [facility];  Service: Cardiovascular;  Laterality: Right;   LOWER EXTREMITY ANGIOGRAPHY Right 03/24/2022   Procedure: Lower  Extremity Angiography;  Surgeon: Algernon Huxley, MD;  Location: [place] [facility];  Service: Cardiovascular;  Laterality: Right;   LOWER EXTREMITY INTERVENTION N/A 11/19/2019   Procedure: LOWER EXTREMITY INTERVENTION;  Surgeon: Algernon Huxley, MD;  Location: [place] [facility];  Service: Cardiovascular;  Laterality: N/A;   LOWER EXTREMITY INTERVENTION Bilateral 06/29/2021   Procedure: LOWER EXTREMITY INTERVENTION;  Surgeon: Algernon Huxley, MD;  Location: [place] [facility];  Service: Cardiovascular;  Laterality: Bilateral;   TEE WITHOUT CARDIOVERSION N/A 06/22/2017   Procedure: TRANSESOPHAGEAL ECHOCARDIOGRAM (TEE);  Surgeon: Wellington Hampshire, MD;  Location: [facility];  Service: Cardiovascular;  Laterality: N/A;   TOTAL KNEE ARTHROPLASTY Right 07/11/2018   Procedure: TOTAL KNEE ARTHROPLASTY;  Surgeon: Corky Mull, MD;  Location: [facility];  Service: Orthopedics;  Laterality: Right;   VAGINAL HYSTERECTOMY     VISCERAL ARTERY INTERVENTION N/A 06/20/2017   Procedure: Visceral Artery Intervention, possible aortic thrombectomy;  Surgeon: Algernon Huxley, MD;  Location: [place] [facility];  Service: Cardiovascular;  Laterality: N/A;   VISCERAL ARTERY INTERVENTION N/A 01/28/2018   Procedure: VISCERAL ARTERY INTERVENTION;  Surgeon: Algernon Huxley, MD;  Location: [place] [facility];  Service: Cardiovascular;  Laterality: N/A;    Family History  Problem Relation Age of Onset   Hypertension Mother    Heart disease Mother    Heart attack Mother    Breast cancer Mother    Hypertension Father    Breast cancer Paternal Aunt     Allergies  Allergen Reactions   Gabapentin      Tremors    Bactrim [Sulfamethoxazole-Trimethoprim] Itching       Latest Ref Rng & Units 09/08/2022    6:00 AM 09/07/2022    4:21 PM 08/31/2022    4:11 AM  CBC  WBC 4.0 - 10.5 K/uL 7.5  8.6  7.8   Hemoglobin 12.0 - 15.0 g/dL 9.5  9.6  10.4   Hematocrit 36.0 - 46.0 % 29.7  30.4  32.9   Platelets 150 - 400 K/uL 238  236  212       CMP     Component Value Date/Time   NA 138 09/08/2022 0600   NA 140 02/12/2015 1137   K 3.1 (L) 09/08/2022 0600   K 2.9 (L) 02/12/2015 1137   CL 98 09/08/2022 0600   CL 107 02/12/2015 1137   CO2 33 (H) 09/08/2022 0600   CO2 27 02/12/2015 1137   GLUCOSE 106 (H) 09/08/2022 0600   GLUCOSE 120 (H) 02/12/2015 1137   BUN 20 09/08/2022 0600   BUN 15 02/12/2015 1137   CREATININE 0.61 09/08/2022 0600   CREATININE 0.78 02/12/2015 1137   CALCIUM 8.6 (L) 09/08/2022 0600   CALCIUM 9.0 02/12/2015 1137   PROT 6.4 (L) 09/08/2022 0600   PROT 7.0 09/14/2020 1139   PROT 7.1 01/19/2015 1112   ALBUMIN 2.5 (L) 09/08/2022 0600   ALBUMIN 3.9 09/14/2020 1139   ALBUMIN 3.8 01/19/2015 1112   AST 15 09/08/2022 0600   AST 17 01/19/2015 1112   ALT 16 09/08/2022 0600   ALT 11 (L) 01/19/2015 1112   ALKPHOS 111 09/08/2022 0600   ALKPHOS 84 01/19/2015 1112   BILITOT 0.5 09/08/2022 0600   BILITOT <0.2 09/14/2020 1139   BILITOT 0.5 01/19/2015 1112   GFRNONAA >60 09/08/2022 0600   GFRNONAA >60 02/12/2015 1137   GFRAA >60 11/23/2019 1330   GFRAA >60 02/12/2015 1137     No results found.  Assessment & Plan:   1. Right leg pain Patient is doing well post removal of hematoma.  The wound is decreasing in size.  Discussed with patient that she needs to be sure that her home health attendant is packing the wound and not just placing the sponge on top as there is some significant tunneling noted.  Will have the patient return in 2 weeks for wound reevaluation.   Current Outpatient Medications on File Prior to Visit  Medication Sig Dispense Refill   alum & mag  hydroxide-simeth (MAALOX/MYLANTA) 200-200-20 MG/5ML suspension Take 15-30 mLs by mouth every 2 (two) hours as needed for indigestion. 355 mL 0   apixaban (ELIQUIS) 5 MG TABS tablet Take 1 tablet (5 mg total) by mouth 2 (two) times daily. 60 tablet 11   aspirin EC 81 MG tablet Take 81 mg by mouth daily. Swallow whole.     atorvastatin (LIPITOR) 80 MG tablet Take 80 mg by mouth at bedtime.     bisacodyl (DULCOLAX) 5 MG EC tablet Take 1 tablet (5 mg total) by mouth daily as needed for moderate constipation. 30 tablet 0   cefTRIAXone (ROCEPHIN) 1 g injection Inject 1 g into the muscle daily.     COMBIVENT RESPIMAT 20-100 MCG/ACT AERS respimat Inhale 1 puff into the lungs every 6 (six) hours as needed.     esomeprazole (NEXIUM) 20 MG capsule Take 20 mg by mouth daily.     feeding supplement (ENSURE ENLIVE / ENSURE PLUS) LIQD Take 237 mLs by mouth 2 (two) times daily between meals. 237 mL 12   ferrous sulfate 325 (65 FE) MG tablet Take 325 mg by mouth daily.     furosemide (LASIX) 40 MG tablet Take 0.5 tablets (20 mg total) by mouth daily. 30 tablet    isosorbide mononitrate (IMDUR) 60 MG 24 hr tablet Take 60 mg by mouth daily.     lidocaine (LIDODERM) 5 % Place 1 patch onto the skin daily. Remove & Discard patch within 12 hours or as directed by MD 30 patch 0   metoprolol tartrate (LOPRESSOR) 25 MG tablet Take 0.5 tablets (12.5 mg total) by mouth 2 (two) times daily. Hold if SBP is 100 or less or HR is less than 60. Notify Provider     morphine (MS CONTIN) 15 MG 12 hr tablet Take 1 tablet (15 mg total) by mouth every 12 (twelve) hours. 10 tablet 0   Multiple Vitamin (MULTIVITAMIN WITH MINERALS) TABS tablet Take 1 tablet by mouth daily. 30 tablet 0   naloxegol oxalate (MOVANTIK) 12.5 MG TABS tablet Take 1 tablet (12.5 mg total) by mouth daily. 30 tablet 0   ondansetron (ZOFRAN) 4 MG tablet Take 4 mg by mouth every 8 (eight) hours as needed for nausea or vomiting.     polyethylene glycol (MIRALAX /  GLYCOLAX) 17 g packet Take 17 g by mouth daily. 14 each 0   pregabalin (LYRICA) 200 MG capsule Take 1 capsule (200 mg total) by mouth in the morning, at noon, and at bedtime. 90 capsule 3   citalopram (CELEXA) 40 MG tablet Take 1 tablet (40 mg total) by mouth daily. 30 tablet 2   No current facility-administered medications on file prior to visit.    There are no Patient Instructions on file for this visit. No follow-ups on file.   Kris Hartmann, NP

## 2022-11-21 ENCOUNTER — Ambulatory Visit (INDEPENDENT_AMBULATORY_CARE_PROVIDER_SITE_OTHER): Payer: 59 | Admitting: Nurse Practitioner

## 2022-11-22 ENCOUNTER — Ambulatory Visit (INDEPENDENT_AMBULATORY_CARE_PROVIDER_SITE_OTHER): Payer: 59 | Admitting: Nurse Practitioner

## 2022-11-22 ENCOUNTER — Encounter (INDEPENDENT_AMBULATORY_CARE_PROVIDER_SITE_OTHER): Payer: Self-pay | Admitting: Nurse Practitioner

## 2022-11-22 VITALS — BP 142/80 | HR 91 | Resp 16

## 2022-11-22 DIAGNOSIS — Z89611 Acquired absence of right leg above knee: Secondary | ICD-10-CM

## 2022-11-30 ENCOUNTER — Telehealth (INDEPENDENT_AMBULATORY_CARE_PROVIDER_SITE_OTHER): Payer: Self-pay

## 2022-11-30 NOTE — Telephone Encounter (Signed)
Left message giving verbal for wound therapy.

## 2022-11-30 NOTE — Telephone Encounter (Signed)
That is fine 

## 2022-11-30 NOTE — Telephone Encounter (Signed)
Carla [name] with [facility] called to see if she can add honey and exoderm to patients wound vac.  Please advise.

## 2022-12-03 ENCOUNTER — Inpatient Hospital Stay
Admission: EM | Admit: 2022-12-03 | Discharge: 2022-12-13 | DRG: 475 | Disposition: A | Discharge status: Skilled Nursing Facility | Payer: 59 | Attending: Internal Medicine | Admitting: Internal Medicine

## 2022-12-03 ENCOUNTER — Emergency Department: Payer: 59

## 2022-12-03 DIAGNOSIS — T8740 Infection of amputation stump, unspecified extremity: Secondary | ICD-10-CM | POA: Diagnosis not present

## 2022-12-03 DIAGNOSIS — F32A Depression, unspecified: Secondary | ICD-10-CM | POA: Diagnosis present

## 2022-12-03 DIAGNOSIS — Z79899 Other long term (current) drug therapy: Secondary | ICD-10-CM

## 2022-12-03 DIAGNOSIS — I10 Essential (primary) hypertension: Secondary | ICD-10-CM | POA: Diagnosis not present

## 2022-12-03 DIAGNOSIS — E876 Hypokalemia: Secondary | ICD-10-CM

## 2022-12-03 DIAGNOSIS — J449 Chronic obstructive pulmonary disease, unspecified: Secondary | ICD-10-CM | POA: Diagnosis present

## 2022-12-03 DIAGNOSIS — R197 Diarrhea, unspecified: Secondary | ICD-10-CM

## 2022-12-03 DIAGNOSIS — I498 Other specified cardiac arrhythmias: Secondary | ICD-10-CM | POA: Diagnosis present

## 2022-12-03 DIAGNOSIS — I480 Paroxysmal atrial fibrillation: Secondary | ICD-10-CM

## 2022-12-03 DIAGNOSIS — R7881 Bacteremia: Secondary | ICD-10-CM | POA: Diagnosis not present

## 2022-12-03 DIAGNOSIS — G8929 Other chronic pain: Secondary | ICD-10-CM

## 2022-12-03 DIAGNOSIS — Z7901 Long term (current) use of anticoagulants: Secondary | ICD-10-CM

## 2022-12-03 DIAGNOSIS — Y835 Amputation of limb(s) as the cause of abnormal reaction of the patient, or of later complication, without mention of misadventure at the time of the procedure: Secondary | ICD-10-CM | POA: Diagnosis present

## 2022-12-03 DIAGNOSIS — K5904 Chronic idiopathic constipation: Secondary | ICD-10-CM | POA: Diagnosis present

## 2022-12-03 DIAGNOSIS — T8743 Infection of amputation stump, right lower extremity: Secondary | ICD-10-CM | POA: Diagnosis present

## 2022-12-03 DIAGNOSIS — G894 Chronic pain syndrome: Secondary | ICD-10-CM | POA: Diagnosis not present

## 2022-12-03 DIAGNOSIS — Z8249 Family history of ischemic heart disease and other diseases of the circulatory system: Secondary | ICD-10-CM

## 2022-12-03 DIAGNOSIS — M86151 Other acute osteomyelitis, right femur: Secondary | ICD-10-CM

## 2022-12-03 DIAGNOSIS — F419 Anxiety disorder, unspecified: Secondary | ICD-10-CM | POA: Diagnosis present

## 2022-12-03 DIAGNOSIS — I2510 Atherosclerotic heart disease of native coronary artery without angina pectoris: Secondary | ICD-10-CM | POA: Diagnosis present

## 2022-12-03 DIAGNOSIS — M170 Bilateral primary osteoarthritis of knee: Secondary | ICD-10-CM | POA: Diagnosis present

## 2022-12-03 DIAGNOSIS — Z6841 Body Mass Index (BMI) 40.0 and over, adult: Secondary | ICD-10-CM | POA: Diagnosis not present

## 2022-12-03 DIAGNOSIS — Z87442 Personal history of urinary calculi: Secondary | ICD-10-CM

## 2022-12-03 DIAGNOSIS — B9562 Methicillin resistant Staphylococcus aureus infection as the cause of diseases classified elsewhere: Secondary | ICD-10-CM | POA: Diagnosis present

## 2022-12-03 DIAGNOSIS — Z89611 Acquired absence of right leg above knee: Secondary | ICD-10-CM | POA: Diagnosis not present

## 2022-12-03 DIAGNOSIS — K439 Ventral hernia without obstruction or gangrene: Secondary | ICD-10-CM | POA: Diagnosis present

## 2022-12-03 DIAGNOSIS — Z79891 Long term (current) use of opiate analgesic: Secondary | ICD-10-CM

## 2022-12-03 DIAGNOSIS — G629 Polyneuropathy, unspecified: Secondary | ICD-10-CM

## 2022-12-03 DIAGNOSIS — K219 Gastro-esophageal reflux disease without esophagitis: Secondary | ICD-10-CM

## 2022-12-03 DIAGNOSIS — Z90710 Acquired absence of both cervix and uterus: Secondary | ICD-10-CM

## 2022-12-03 DIAGNOSIS — Z1612 Extended spectrum beta lactamase (ESBL) resistance: Secondary | ICD-10-CM | POA: Diagnosis present

## 2022-12-03 DIAGNOSIS — E559 Vitamin D deficiency, unspecified: Secondary | ICD-10-CM | POA: Diagnosis present

## 2022-12-03 DIAGNOSIS — M868X5 Other osteomyelitis, thigh: Principal | ICD-10-CM

## 2022-12-03 DIAGNOSIS — K551 Chronic vascular disorders of intestine: Secondary | ICD-10-CM | POA: Diagnosis present

## 2022-12-03 DIAGNOSIS — M79672 Pain in left foot: Secondary | ICD-10-CM | POA: Diagnosis not present

## 2022-12-03 DIAGNOSIS — Z85828 Personal history of other malignant neoplasm of skin: Secondary | ICD-10-CM

## 2022-12-03 DIAGNOSIS — Z8616 Personal history of COVID-19: Secondary | ICD-10-CM | POA: Diagnosis not present

## 2022-12-03 DIAGNOSIS — I739 Peripheral vascular disease, unspecified: Secondary | ICD-10-CM

## 2022-12-03 DIAGNOSIS — Z7982 Long term (current) use of aspirin: Secondary | ICD-10-CM

## 2022-12-03 DIAGNOSIS — R5381 Other malaise: Secondary | ICD-10-CM | POA: Diagnosis present

## 2022-12-03 DIAGNOSIS — E7800 Pure hypercholesterolemia, unspecified: Secondary | ICD-10-CM | POA: Diagnosis present

## 2022-12-03 DIAGNOSIS — Z8701 Personal history of pneumonia (recurrent): Secondary | ICD-10-CM

## 2022-12-03 DIAGNOSIS — D509 Iron deficiency anemia, unspecified: Secondary | ICD-10-CM | POA: Diagnosis present

## 2022-12-03 DIAGNOSIS — E871 Hypo-osmolality and hyponatremia: Secondary | ICD-10-CM | POA: Diagnosis present

## 2022-12-03 DIAGNOSIS — R000 Tachycardia, unspecified: Secondary | ICD-10-CM | POA: Diagnosis present

## 2022-12-03 DIAGNOSIS — Z89422 Acquired absence of other left toe(s): Secondary | ICD-10-CM

## 2022-12-03 DIAGNOSIS — E785 Hyperlipidemia, unspecified: Secondary | ICD-10-CM

## 2022-12-03 DIAGNOSIS — I509 Heart failure, unspecified: Secondary | ICD-10-CM | POA: Diagnosis present

## 2022-12-03 DIAGNOSIS — I252 Old myocardial infarction: Secondary | ICD-10-CM

## 2022-12-03 DIAGNOSIS — Z9889 Other specified postprocedural states: Secondary | ICD-10-CM | POA: Diagnosis not present

## 2022-12-03 DIAGNOSIS — T8789 Other complications of amputation stump: Secondary | ICD-10-CM | POA: Diagnosis not present

## 2022-12-03 DIAGNOSIS — I110 Hypertensive heart disease with heart failure: Secondary | ICD-10-CM | POA: Diagnosis present

## 2022-12-03 DIAGNOSIS — Z888 Allergy status to other drugs, medicaments and biological substances status: Secondary | ICD-10-CM

## 2022-12-03 DIAGNOSIS — Z9049 Acquired absence of other specified parts of digestive tract: Secondary | ICD-10-CM

## 2022-12-03 DIAGNOSIS — M5441 Lumbago with sciatica, right side: Secondary | ICD-10-CM | POA: Diagnosis present

## 2022-12-03 DIAGNOSIS — R7303 Prediabetes: Secondary | ICD-10-CM | POA: Diagnosis present

## 2022-12-03 DIAGNOSIS — Z8719 Personal history of other diseases of the digestive system: Secondary | ICD-10-CM

## 2022-12-03 DIAGNOSIS — M7981 Nontraumatic hematoma of soft tissue: Secondary | ICD-10-CM | POA: Diagnosis not present

## 2022-12-03 DIAGNOSIS — L7632 Postprocedural hematoma of skin and subcutaneous tissue following other procedure: Secondary | ICD-10-CM | POA: Diagnosis present

## 2022-12-03 DIAGNOSIS — Z87891 Personal history of nicotine dependence: Secondary | ICD-10-CM

## 2022-12-03 DIAGNOSIS — M419 Scoliosis, unspecified: Secondary | ICD-10-CM | POA: Diagnosis present

## 2022-12-03 DIAGNOSIS — Z882 Allergy status to sulfonamides status: Secondary | ICD-10-CM

## 2022-12-03 DIAGNOSIS — Z86718 Personal history of other venous thrombosis and embolism: Secondary | ICD-10-CM

## 2022-12-03 DIAGNOSIS — Z96651 Presence of right artificial knee joint: Secondary | ICD-10-CM | POA: Diagnosis present

## 2022-12-03 DIAGNOSIS — B9620 Unspecified Escherichia coli [E. coli] as the cause of diseases classified elsewhere: Secondary | ICD-10-CM | POA: Diagnosis present

## 2022-12-03 LAB — CBC WITH DIFFERENTIAL/PLATELET
Abs Immature Granulocytes: 0.03 10*3/uL (ref 0.00–0.07)
Basophils Absolute: 0.1 10*3/uL (ref 0.0–0.1)
Basophils Relative: 1 %
Eosinophils Absolute: 0.1 10*3/uL (ref 0.0–0.5)
Eosinophils Relative: 1 %
HCT: 44.4 % (ref 36.0–46.0)
Hemoglobin: 14 g/dL (ref 12.0–15.0)
Immature Granulocytes: 0 %
Lymphocytes Relative: 25 %
Lymphs Abs: 2.7 10*3/uL (ref 0.7–4.0)
MCH: 26.4 pg (ref 26.0–34.0)
MCHC: 31.5 g/dL (ref 30.0–36.0)
MCV: 83.8 fL (ref 80.0–100.0)
Monocytes Absolute: 0.8 10*3/uL (ref 0.1–1.0)
Monocytes Relative: 7 %
Neutro Abs: 7.2 10*3/uL (ref 1.7–7.7)
Neutrophils Relative %: 66 %
Platelets: 272 10*3/uL (ref 150–400)
RBC: 5.3 MIL/uL — ABNORMAL HIGH (ref 3.87–5.11)
RDW: 17.5 % — ABNORMAL HIGH (ref 11.5–15.5)
WBC: 10.8 10*3/uL — ABNORMAL HIGH (ref 4.0–10.5)
nRBC: 0 % (ref 0.0–0.2)

## 2022-12-03 LAB — COMPREHENSIVE METABOLIC PANEL
ALT: 15 U/L (ref 0–44)
AST: 23 U/L (ref 15–41)
Albumin: 3.7 g/dL (ref 3.5–5.0)
Alkaline Phosphatase: 121 U/L (ref 38–126)
Anion gap: 11 (ref 5–15)
BUN: 14 mg/dL (ref 8–23)
CO2: 21 mmol/L — ABNORMAL LOW (ref 22–32)
Calcium: 9.4 mg/dL (ref 8.9–10.3)
Chloride: 107 mmol/L (ref 98–111)
Creatinine, Ser: 0.8 mg/dL (ref 0.44–1.00)
GFR, Estimated: >60 mL/min (ref >60)
Glucose, Bld: 125 mg/dL — ABNORMAL HIGH (ref 70–99)
Potassium: 3 mmol/L — ABNORMAL LOW (ref 3.5–5.1)
Sodium: 139 mmol/L (ref 135–145)
Total Bilirubin: 1 mg/dL (ref 0.3–1.2)
Total Protein: 7.9 g/dL (ref 6.5–8.1)

## 2022-12-03 LAB — PROCALCITONIN: Procalcitonin: <0.1 ng/mL

## 2022-12-03 LAB — APTT
aPTT: 26 seconds (ref 24–36)
aPTT: 53 seconds — ABNORMAL HIGH (ref 24–36)

## 2022-12-03 LAB — RESP PANEL BY RT-PCR (RSV, FLU A&B, COVID)  RVPGX2
Influenza A by PCR: NEGATIVE
Influenza B by PCR: NEGATIVE
Resp Syncytial Virus by PCR: NEGATIVE
SARS Coronavirus 2 by RT PCR: NEGATIVE

## 2022-12-03 LAB — BRAIN NATRIURETIC PEPTIDE: B Natriuretic Peptide: 26.6 pg/mL (ref 0.0–100.0)

## 2022-12-03 LAB — URINALYSIS, ROUTINE W REFLEX MICROSCOPIC
Bacteria, UA: NONE SEEN
Bilirubin Urine: NEGATIVE
Glucose, UA: NEGATIVE mg/dL
Hgb urine dipstick: NEGATIVE
Ketones, ur: 5 mg/dL — AB
Leukocytes,Ua: NEGATIVE
Nitrite: NEGATIVE
Protein, ur: 100 mg/dL — AB
Specific Gravity, Urine: 1.039 — ABNORMAL HIGH (ref 1.005–1.030)
pH: 5 (ref 5.0–8.0)

## 2022-12-03 LAB — PROTIME-INR
INR: 1.1 (ref 0.8–1.2)
Prothrombin Time: 14.3 seconds (ref 11.4–15.2)

## 2022-12-03 LAB — TROPONIN I (HIGH SENSITIVITY)
Troponin I (High Sensitivity): 8 ng/L (ref <18)
Troponin I (High Sensitivity): 7 ng/L (ref <18)

## 2022-12-03 LAB — HEPARIN LEVEL (UNFRACTIONATED)
Heparin Unfractionated: 0.37 IU/mL (ref 0.30–0.70)
Heparin Unfractionated: 0.64 IU/mL (ref 0.30–0.70)

## 2022-12-03 LAB — MAGNESIUM: Magnesium: 2.2 mg/dL (ref 1.7–2.4)

## 2022-12-03 MED ORDER — LACTATED RINGERS IV BOLUS
1000.0000 mL | Freq: Once | INTRAVENOUS | Status: AC
  Administered 2022-12-03: 1000 mL via INTRAVENOUS

## 2022-12-03 MED ORDER — PANTOPRAZOLE SODIUM 40 MG PO TBEC
40.0000 mg | DELAYED_RELEASE_TABLET | Freq: Every day | ORAL | Status: DC
  Administered 2022-12-03 – 2022-12-13 (×11): 40 mg via ORAL
  Filled 2022-12-03 (×11): qty 1

## 2022-12-03 MED ORDER — POTASSIUM CHLORIDE IN NACL 20-0.9 MEQ/L-% IV SOLN
INTRAVENOUS | Status: DC
  Filled 2022-12-03 (×3): qty 1000

## 2022-12-03 MED ORDER — ONDANSETRON HCL 4 MG PO TABS: 4.0000 mg | ORAL_TABLET | Freq: Three times a day (TID) | ORAL | Status: DC | PRN

## 2022-12-03 MED ORDER — ISOSORBIDE MONONITRATE ER 30 MG PO TB24
60.0000 mg | ORAL_TABLET | Freq: Every day | ORAL | Status: DC
  Administered 2022-12-03 – 2022-12-13 (×9): 60 mg via ORAL
  Filled 2022-12-03 (×5): qty 2
  Filled 2022-12-03: qty 1
  Filled 2022-12-03 (×4): qty 2
  Filled 2022-12-03: qty 1

## 2022-12-03 MED ORDER — HYDROMORPHONE HCL 1 MG/ML IJ SOLN
1.0000 mg | Freq: Once | INTRAMUSCULAR | Status: AC
  Administered 2022-12-03: 1 mg via INTRAVENOUS
  Filled 2022-12-03: qty 1

## 2022-12-03 MED ORDER — HEPARIN BOLUS VIA INFUSION
4000.0000 [IU] | Freq: Once | INTRAVENOUS | Status: AC
  Administered 2022-12-03: 4000 [IU] via INTRAVENOUS
  Filled 2022-12-03: qty 4000

## 2022-12-03 MED ORDER — HYDROMORPHONE HCL 1 MG/ML IJ SOLN
0.5000 mg | Freq: Once | INTRAMUSCULAR | Status: AC
  Administered 2022-12-03: 0.5 mg via INTRAVENOUS
  Filled 2022-12-03: qty 0.5

## 2022-12-03 MED ORDER — HEPARIN (PORCINE) 25000 UT/250ML-% IV SOLN
1550.0000 [IU]/h | INTRAVENOUS | Status: DC
  Administered 2022-12-03: 1200 [IU]/h via INTRAVENOUS
  Administered 2022-12-04 (×2): 1450 [IU]/h via INTRAVENOUS
  Administered 2022-12-05 – 2022-12-06 (×2): 1350 [IU]/h via INTRAVENOUS
  Filled 2022-12-03 (×6): qty 250

## 2022-12-03 MED ORDER — BISACODYL 5 MG PO TBEC: 5.0000 mg | DELAYED_RELEASE_TABLET | Freq: Every day | ORAL | Status: DC | PRN

## 2022-12-03 MED ORDER — FERROUS SULFATE 325 (65 FE) MG PO TABS
325.0000 mg | ORAL_TABLET | Freq: Every day | ORAL | Status: DC
  Administered 2022-12-04 – 2022-12-13 (×9): 325 mg via ORAL
  Filled 2022-12-03 (×10): qty 1

## 2022-12-03 MED ORDER — LIDOCAINE 5 % EX PTCH
1.0000 | MEDICATED_PATCH | CUTANEOUS | Status: DC
  Administered 2022-12-03 – 2022-12-13 (×11): 1 via TRANSDERMAL
  Filled 2022-12-03 (×12): qty 1

## 2022-12-03 MED ORDER — MORPHINE SULFATE ER 15 MG PO TBCR
15.0000 mg | EXTENDED_RELEASE_TABLET | Freq: Two times a day (BID) | ORAL | Status: DC
  Administered 2022-12-03 – 2022-12-13 (×21): 15 mg via ORAL
  Filled 2022-12-03 (×21): qty 1

## 2022-12-03 MED ORDER — ACETAMINOPHEN 325 MG PO TABS
650.0000 mg | ORAL_TABLET | Freq: Four times a day (QID) | ORAL | Status: DC | PRN
  Administered 2022-12-08 – 2022-12-12 (×2): 650 mg via ORAL
  Filled 2022-12-03 (×2): qty 2

## 2022-12-03 MED ORDER — PREGABALIN 75 MG PO CAPS
200.0000 mg | ORAL_CAPSULE | Freq: Three times a day (TID) | ORAL | Status: DC
  Administered 2022-12-03 – 2022-12-13 (×31): 200 mg via ORAL
  Filled 2022-12-03 (×4): qty 1
  Filled 2022-12-03: qty 4
  Filled 2022-12-03 (×24): qty 1
  Filled 2022-12-03: qty 4
  Filled 2022-12-03: qty 1

## 2022-12-03 MED ORDER — VANCOMYCIN HCL 2000 MG/400ML IV SOLN
2000.0000 mg | Freq: Once | INTRAVENOUS | Status: AC
  Administered 2022-12-03: 2000 mg via INTRAVENOUS
  Filled 2022-12-03: qty 400

## 2022-12-03 MED ORDER — METOPROLOL TARTRATE 25 MG PO TABS
12.5000 mg | ORAL_TABLET | Freq: Two times a day (BID) | ORAL | Status: DC
  Administered 2022-12-03 – 2022-12-13 (×17): 12.5 mg via ORAL
  Filled 2022-12-03 (×20): qty 1

## 2022-12-03 MED ORDER — HEPARIN BOLUS VIA INFUSION
2000.0000 [IU] | Freq: Once | INTRAVENOUS | Status: AC
  Administered 2022-12-03: 2000 [IU] via INTRAVENOUS
  Filled 2022-12-03: qty 2000

## 2022-12-03 MED ORDER — POTASSIUM CHLORIDE CRYS ER 20 MEQ PO TBCR
40.0000 meq | EXTENDED_RELEASE_TABLET | Freq: Once | ORAL | Status: AC
  Administered 2022-12-03: 40 meq via ORAL
  Filled 2022-12-03: qty 2

## 2022-12-03 MED ORDER — ALUM & MAG HYDROXIDE-SIMETH 200-200-20 MG/5ML PO SUSP: 15.0000 mL | ORAL | Status: DC | PRN

## 2022-12-03 MED ORDER — ADULT MULTIVITAMIN W/MINERALS CH
1.0000 | ORAL_TABLET | Freq: Every day | ORAL | Status: DC
  Administered 2022-12-03 – 2022-12-13 (×11): 1 via ORAL
  Filled 2022-12-03 (×11): qty 1

## 2022-12-03 MED ORDER — IPRATROPIUM-ALBUTEROL 0.5-2.5 (3) MG/3ML IN SOLN: 3.0000 mL | Freq: Four times a day (QID) | RESPIRATORY_TRACT | Status: DC | PRN

## 2022-12-03 MED ORDER — VANCOMYCIN HCL 1250 MG/250ML IV SOLN
1250.0000 mg | INTRAVENOUS | Status: DC
  Administered 2022-12-04 – 2022-12-06 (×3): 1250 mg via INTRAVENOUS
  Filled 2022-12-03 (×3): qty 250

## 2022-12-03 MED ORDER — ATORVASTATIN CALCIUM 20 MG PO TABS
80.0000 mg | ORAL_TABLET | Freq: Every day | ORAL | Status: DC
  Administered 2022-12-03 – 2022-12-12 (×10): 80 mg via ORAL
  Filled 2022-12-03 (×10): qty 4

## 2022-12-03 MED ORDER — PIPERACILLIN-TAZOBACTAM 3.375 G IVPB
3.3750 g | Freq: Three times a day (TID) | INTRAVENOUS | Status: AC
  Administered 2022-12-03 – 2022-12-06 (×10): 3.375 g via INTRAVENOUS
  Filled 2022-12-03 (×10): qty 50

## 2022-12-03 MED ORDER — IOHEXOL 300 MG/ML  SOLN
100.0000 mL | Freq: Once | INTRAMUSCULAR | Status: AC | PRN
  Administered 2022-12-03: 100 mL via INTRAVENOUS

## 2022-12-03 MED ORDER — ACETAMINOPHEN 650 MG RE SUPP: 650.0000 mg | Freq: Four times a day (QID) | RECTAL | Status: DC | PRN

## 2022-12-03 MED ORDER — HYDROCODONE-ACETAMINOPHEN 5-325 MG PO TABS
1.0000 | ORAL_TABLET | ORAL | Status: DC | PRN
  Administered 2022-12-03: 1 via ORAL
  Filled 2022-12-03: qty 1

## 2022-12-03 MED ORDER — PIPERACILLIN-TAZOBACTAM 3.375 G IVPB 30 MIN
3.3750 g | Freq: Once | INTRAVENOUS | Status: AC
  Administered 2022-12-03: 3.375 g via INTRAVENOUS
  Filled 2022-12-03: qty 50

## 2022-12-03 MED ORDER — HYDROCODONE-ACETAMINOPHEN 7.5-325 MG PO TABS
1.0000 | ORAL_TABLET | Freq: Four times a day (QID) | ORAL | Status: DC | PRN
  Administered 2022-12-03 – 2022-12-04 (×2): 1 via ORAL
  Filled 2022-12-03 (×3): qty 1

## 2022-12-03 NOTE — Assessment & Plan Note (Signed)
Patient has a predisposition for developing blood clots will bridge with heparin drip at this point.

## 2022-12-03 NOTE — Assessment & Plan Note (Signed)
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  Likely will need an AKA revision.  Case discussed with vascular surgery.  Since her last dose of Eliquis was last night, we will bridge with heparin and plan on being n.p.o. after midnight tonight for possible surgery tomorrow.  Will get a wound culture.  Empiric antibiotics with vancomycin and Zosyn.

## 2022-12-03 NOTE — ED Provider Notes (Signed)
[facility] Provider Note    Event Date/Time   First MD Initiated Contact with Patient 12/03/22 [phone]     (approximate)   History   Chest Pain (Pt family called out for complaints of pt "feeling like heart is fluttering" family gave 325asa and 1 SL nitro. Hx afib. Denies pain at this time.)   HPI  Carla [name] is a 62 y.o. female who presents to the ED for evaluation of Chest Pain (Pt family called out for complaints of pt "feeling like heart is fluttering" family gave 325asa and 1 SL nitro. Hx afib. Denies pain at this time.)   I review 11/17 medical dc summary. Hx CAD,PAD s/p right AKA. dCHF, COPD, Afib on eliquis.  Previously on 11/6 she had vascular surgery I&D of fluid collection of her AKA stump, found to be a hematoma  Patient presents to the ED from home via EMS for evaluation of chest discomfort, palpitations, dizziness and feeling unwell in a generalized fashion over the past couple days.  Reports diarrhea and LLQ abdominal pain.  Denies any fevers.  Reluctantly admits that her right AKA stump and wound has been hurting her more over the past couple days as well   Physical Exam   Triage Vital Signs: ED Triage Vitals  Enc Vitals Group     BP      Pulse      Resp      Temp      Temp src      SpO2      Weight      Height      Head Circumference      Peak Flow      Pain Score      Pain Loc      Pain Edu?      Excl. in [place]?     Most recent vital signs: Vitals:   12/03/22 0620 12/03/22 0714  BP: 128/74   Pulse: 80   Resp: 20 (!) 22  Temp:    SpO2: 95%     General: Awake, no distress.  Obese, tearful and anxious CV:  Good peripheral perfusion.  Resp:  Normal effort.  Abd:  No distention.  LLQ tenderness without peritoneal features.  Large ventral hernia to the right right soft and reducible MSK:  No deformity noted.  Right AKA stump with bandage in place that is clean.  She initially refuses to let me take this bandage down to  look at the wound. I am able to take down the bandage which shows a circular wound to the subcutaneous tissue with granulation tissue.  Tender to palpation, but no fluctuance or induration surrounding wound. Neuro:  No focal deficits appreciated. Other:     ED Results / Procedures / Treatments   Labs (all labs ordered are listed, but only abnormal results are displayed) Labs Reviewed  COMPREHENSIVE METABOLIC PANEL - Abnormal; Notable for the following components:      Result Value   Potassium 3.0 (*)    CO2 21 (*)    Glucose, Bld 125 (*)    All other components within normal limits  CBC WITH DIFFERENTIAL/PLATELET - Abnormal; Notable for the following components:   WBC 10.8 (*)    RBC 5.30 (*)    RDW 17.5 (*)    All other components within normal limits  URINALYSIS, ROUTINE W REFLEX MICROSCOPIC - Abnormal; Notable for the following components:   Color, Urine AMBER (*)    APPearance  HAZY (*)    Specific Gravity, Urine 1.039 (*)    Ketones, ur 5 (*)    Protein, ur 100 (*)    All other components within normal limits  RESP PANEL BY RT-PCR (RSV, FLU A&B, COVID)  RVPGX2  BRAIN NATRIURETIC PEPTIDE  MAGNESIUM  PROCALCITONIN  TROPONIN I (HIGH SENSITIVITY)  TROPONIN I (HIGH SENSITIVITY)    EKG Sinus tachycardia with rate of 100 bpm.  Normal axis and intervals.  No clear signs of acute ischemia.  RADIOLOGY CXR interpreted by me without evidence of acute cardiopulmonary pathology. CT abdomen/pelvis interpreted by me without evidence of SBO  Official radiology report(s): CT FEMUR RIGHT W CONTRAST  Result Date: 12/03/2022 CLINICAL DATA:  Right lower extremity above the knee amputation, with surface draining wound at the anterior stump and suspected stump abscess. EXAM: CT OF THE LOWER RIGHT EXTREMITY WITH CONTRAST TECHNIQUE: Multidetector CT imaging of the lower right extremity was performed according to the standard protocol following intravenous contrast administration. RADIATION  DOSE REDUCTION: This exam was performed according to the departmental dose-optimization program which includes automated exposure control, adjustment of the mA and/or kV according to patient size and/or use of iterative reconstruction technique. CONTRAST:  170m OMNIPAQUE IOHEXOL 300 MG/ML  SOLN COMPARISON:  Similar study dated 08/26/2022 FINDINGS: Bones/Joint/Cartilage An above-the-knee amputation of the right lower extremity is again noted. The proximal and mid shaft of the femur again demonstrates numerous punctate lucencies in the outer and endosteal cortex, probably due to cortical holes related to disuse osteopenia. 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 There is partial joint space loss of the right hip with acetabular spurs. There are no other findings to suggest osteomyelitis elsewhere. There is mild spurring of the right SI joint. The visualized right hemipelvis is osteopenic but intact. Ligaments Suboptimally assessed by CT. Muscles and Tendons Amputation changes with partial right thigh muscle atrophy. The more proximal tendons at the level of the gluteal insertions and iliopsoas, are unremarkable. There is also partial gluteal muscular atrophy. There is no soft tissue gas in deep intramuscular planes in the thigh. Soft tissues A complex collection of mixed attenuation and a thick wall continues to surround the distal femoral stump. This continues to measure 7 x 6 cm coronal and AP, slightly larger craniocaudal with today's craniocaudal measurement of 7.4 cm, previously 6.7 cm. A portion of this of appears to extend into the intramedullary space of the stump. There is increasing enhancement of its outer wall. There is new demonstration of a tract from the collection to an open wound on the anterior  skin surface of the distal thigh on series 3 axial images 252-260, which was not seen previously. No soft tissue gas is seen in the collection or in the subcutaneous tissues but an infectious complication is considered present until proven otherwise given the additional findings. There is increasing prominence of right inguinal chain nodes some of which measure up to 1 cm in short axis. Surgical clips are again noted in the right groin alongside vascular structures. A large ventral hernia sags to the right as before with multiple small and large bowel loops within a no dilated bowel in the hernia sac or visualized abdomen. There is a stent in proximal right external iliac artery. IMPRESSION: 1. Increasingly eroded appearance of the distal 5 cm of the femoral shaft to  the resection margin, with increased raised periosteal reaction posteriorly, consistent with stump osteomyelitis. 2. 7 x 6 x 7.4 cm complex collection of mixed attenuation and a thick wall continues to surround the distal femoral stump. A portion of this appears to extend into the intramedullary space of the stump. 3. There is new demonstration of a tract from the collection to an open wound on the anterior skin surface of the distal thigh. No soft tissue gas is seen in the collection or in the subcutaneous tissues but an infectious collection is considered present until proven otherwise. 4. Increasing prominence of right inguinal chain nodes some of which measure up to 1 cm in short axis. 5. Large ventral hernia again sags to the right with multiple small and large bowel loops within both with no dilated bowel in the hernia sac or visualized abdomen. Electronically Signed   By: Telford Nab M.D.   On: 12/03/2022 06:13   CT ABDOMEN PELVIS W CONTRAST  Result Date: 12/03/2022 CLINICAL DATA:  Left lower quadrant abdominal pain and diarrhea. EXAM: CT ABDOMEN AND PELVIS WITH CONTRAST TECHNIQUE: Multidetector CT imaging of the abdomen and pelvis was  performed using the standard protocol following bolus administration of intravenous contrast. RADIATION DOSE REDUCTION: This exam was performed according to the departmental dose-optimization program which includes automated exposure control, adjustment of the mA and/or kV according to patient size and/or use of iterative reconstruction technique. CONTRAST:  149m OMNIPAQUE IOHEXOL 300 MG/ML  SOLN COMPARISON:  09/07/2022 FINDINGS: Lower chest: Of the unremarkable. Hepatobiliary: No suspicious focal abnormality within the liver parenchyma. Gallbladder is surgically absent. Intra and extrahepatic biliary duct dilatation is similar to prior with gradual tapering of the common bile duct in the head of the pancreas measuring 7-8 mm diameter just proximal to the ampulla. Pancreas: No focal mass lesion. No dilatation of the main duct. No intraparenchymal cyst. No peripancreatic edema. Spleen: No splenomegaly. No focal mass lesion. Adrenals/Urinary Tract: Right adrenal gland unremarkable. Stable thickening left adrenal gland. Kidneys unremarkable. No evidence for hydroureter. The urinary bladder appears normal for the degree of distention. Stomach/Bowel: Stomach is unremarkable. No gastric wall thickening. No evidence of outlet obstruction. Duodenum is normally positioned as is the ligament of Treitz. No small bowel wall thickening. No small bowel dilatation. The terminal ileum is normal. The appendix is not well visualized, but there is no edema or inflammation in the region of the cecum. No gross colonic mass. No colonic wall thickening. 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 Extra anatomic bypass graft left thoracoabdominal wall. Fluid collection around the graft again noted, slightly increased in the interval. Celiac axis is patent. Stent device noted proximal SMA with occlusion, as described on CTA exam 07/22/2022. Renal arteries are patent. There is  no gastrohepatic or hepatoduodenal ligament lymphadenopathy. No retroperitoneal or mesenteric lymphadenopathy. No pelvic sidewall lymphadenopathy. Reproductive: Uterus surgically absent.  There is no adnexal mass. Other: No intraperitoneal free fluid. Musculoskeletal: No worrisome lytic or sclerotic osseous abnormality. Large wide-mouth ventral hernia containing small bowel and colon again noted without complicating features. IMPRESSION: 1. No acute findings in the abdomen or pelvis. Specifically, no findings to explain the patient's history of left lower quadrant pain and diarrhea. 2. 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 Extra anatomic bypass graft left thoracoabdominal wall. Fluid collection around the graft again noted, slightly increased in the interval. 3. Stable large wide-mouth ventral hernia containing small bowel and colon  without complicating features. 4. Intra and extrahepatic biliary duct dilatation is similar to prior with gradual tapering of the common bile duct in the head of the pancreas measuring 7-8 mm diameter just proximal to the ampulla. This may be related to prior cholecystectomy. Correlation with liver function test may prove helpful. 5.  Aortic Atherosclerosis (ICD10-I70.0). Electronically Signed   By: Misty Stanley M.D.   On: 12/03/2022 05:52   DG Chest Portable 1 View  Result Date: 12/03/2022 CLINICAL DATA:  Chest pain and tachycardia EXAM: PORTABLE CHEST 1 VIEW COMPARISON:  05/02/2022 FINDINGS: Artifact from EKG leads. Normal heart size and mediastinal contours. No acute infiltrate or edema. No effusion or pneumothorax. No acute osseous findings. IMPRESSION: No active disease. Electronically Signed   By: Jorje Guild M.D.   On: 12/03/2022 04:03    PROCEDURES and INTERVENTIONS:  .1-3 Lead EKG Interpretation  Performed by: Vladimir Crofts, MD Authorized by: Vladimir Crofts, MD     Interpretation: abnormal     ECG rate:  110   ECG rate  assessment: tachycardic     Rhythm: sinus tachycardia     Ectopy: none     Conduction: normal   .Critical Care  Performed by: Vladimir Crofts, MD Authorized by: Vladimir Crofts, MD   Critical care provider statement:    Critical care time (minutes):  30   Critical care time was exclusive of:  Separately billable procedures and treating other patients   Critical care was necessary to treat or prevent imminent or life-threatening deterioration of the following conditions:  Sepsis   Critical care was time spent personally by me on the following activities:  Development of treatment plan with patient or surrogate, discussions with consultants, evaluation of patient's response to treatment, examination of patient, ordering and review of laboratory studies, ordering and review of radiographic studies, ordering and performing treatments and interventions, pulse oximetry, re-evaluation of patient's condition and review of old charts   Medications  vancomycin (VANCOREADY) IVPB 2000 mg/400 mL (2,000 mg Intravenous New Bag/Given 12/03/22 0716)  HYDROmorphone (DILAUDID) injection 1 mg (1 mg Intravenous Given 12/03/22 0418)  lactated ringers bolus 1,000 mL (0 mLs Intravenous Stopped 12/03/22 0637)  HYDROmorphone (DILAUDID) injection 0.5 mg (0.5 mg Intravenous Given 12/03/22 0512)  iohexol (OMNIPAQUE) 300 MG/ML solution 100 mL (100 mLs Intravenous Contrast Given 12/03/22 0536)  piperacillin-tazobactam (ZOSYN) IVPB 3.375 g (0 g Intravenous Stopped 12/03/22 0715)     IMPRESSION / MDM / [place] / ED COURSE  I reviewed the triage vital signs and the nursing notes.  Differential diagnosis includes, but is not limited to, dehydration, sepsis, AKI, gastroenteritis, diverticulitis, SBO  {Patient presents with symptoms of an acute illness or injury that is potentially life-threatening.  62 year old woman presents to the ED with a few days of feeling unwell, diarrhea and increasing AKA stump pain, with  evidence of osteomyelitis.  Requiring antibiotics and admission.  Tachycardic and intermittently tachypneic but hemodynamically stable.  Blood work with marginal leukocytosis.  Hypokalemia is noted.  Troponin is negative and urine with ketones suggestive of dehydration but no infectious features.  She has a mild LLQ tenderness on exam and so CT scan obtained of the abdomen without evidence of acute features.  CT scan also of the femur with stigmata of osteomyelitis as the likely etiology of her generalized symptoms with the past couple days.  We will initiate antibiotics and consult medicine for admission.      FINAL CLINICAL IMPRESSION(S) / ED DIAGNOSES   Final diagnoses:  Other osteomyelitis of right femur ([place])     Rx / DC Orders   ED Discharge Orders     None        Note:  This document was prepared using Dragon voice recognition software and may include unintentional dictation errors.   Vladimir Crofts, MD 12/03/22 [id number]

## 2022-12-03 NOTE — Assessment & Plan Note (Signed)
Continue Lyrica. 

## 2022-12-03 NOTE — Assessment & Plan Note (Signed)
Send stool studies.  Hold MiraLAX.

## 2022-12-03 NOTE — Consult Note (Signed)
[facility] [place]  Patient name: Carla [name] MRN: [id number] DOB: 1961-07-11 Sex: female   REQUESTING PROVIDER:    Hospital Service   REASON FOR CONSULT:    Wound infection  HISTORY OF PRESENT ILLNESS:   Carla [name] is a 62 y.o. female, who is status post multiple revascularization procedures and has a right above-knee amputation.  In October 2023, she underwent a left axillary to femoral bypass graft for ischemic left leg.  In November she underwent I&D of a right thigh hematoma.  She was last seen in the office in January and was recovering nicely.  She presented to the emergency department on 12/03/2022 with heart fluttering, dizziness and shortness of breath.  She is complaining of pain in her amputation site that is not controlled with her normal medications.  She denies any fevers or chills.  The patient is on Eliquis.  Her last dose was 12/02/2022.The patient has an extensive vascular history including mesenteric artery stenting. She also has a large ventral hernia with ulceration that is causing her significant pain. She has a history of GI bleed. She ihas atrial fibrillation. She has a history of MI. She suffers from COPD secondary to chronic tobacco abuse. She is on a statin for hypercholesterolemia. She is medically managed for hypertension.   PAST MEDICAL HISTORY    Past Medical History:  Diagnosis Date   Abdominal aortic atherosclerosis ([place])    a. 05/2017 CTA abd/pelvis: significant atherosclerotic dzs of the infrarenal abd Ao w/ some mural thrombus. No aneurysm or dissection.   Acute focal ischemia of small intestine (HCC)    Acute right-sided low back pain with right-sided sciatica 06/13/2017   AKI (acute kidney injury) ([place]) 07/29/2017   Anemia    Anginal pain ([place])    a. 08/2012 Lexiscan MV: EF 54%, non ischemia/infarct.   Anxiety and depression    Baker's cyst of knee, right    a. 07/2016 U/S: 4.1 x 1.4  x 2.9 cystic structure in R poplitetal fossa.   Bell's palsy    CAD (coronary artery disease)    CHF (congestive heart failure) ([place])    a.) TTE 06/20/2017: EF 60-65%, no rwma, G1DD, no source of cardiac emboli.   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 05/2017 CTA Abd/pelvis: apparent thrombus or embolus in prox SMA (70-90%); b. 05/2017 catheter directed tPA, mechanical thrombectomy, and stenting of the SMA.   Essential hypertension with goal blood pressure less than 140/90 01/19/2016   Gastroesophageal reflux disease 01/19/2016   GERD (gastroesophageal reflux disease)    H/O colectomy    History of 2019 novel coronavirus disease (COVID-19) 11/14/2019   History of kidney stones    Hyperlipidemia    Hypertension    Morbid obesity with BMI of 40.0-44.9, adult ([place])    NSTEMI (non-ST elevated myocardial infarction) ([place]) 06/27/2021   a.) high sensitivity troponins trended: 893 --> 1587 --> 1748 --> 868 --> 735 ng/L   Occlusive mesenteric ischemia ([place]) 06/19/2017   Osteoarthritis    PAD (peripheral artery disease) (HCC)    PAF (paroxysmal atrial fibrillation) (HCC)    a.) CHA2DS2-VASc = 6 (sex, CHF, HTN, DVT x2, aortic plaque). b.) rate/rhythm maintained on oral metoprolol tartrate; chronically anticoagulated  with standard dose apixaban.   Palpitations    Peripheral edema    Personal history of other malignant neoplasm of skin 06/01/2016   Pneumonia    Pre-diabetes    Primary osteoarthritis of both knees 06/13/2017   SBO (small bowel obstruction) ([place]) 08/06/2017   Skin cancer of nose    Superior mesenteric artery thrombosis (HCC)    Vitamin D deficiency      FAMILY HISTORY   Family History  Problem Relation Age of Onset   Hypertension Mother    Heart disease Mother     Heart attack Mother    Breast cancer Mother    Heart disease Father    Hypertension Father    Breast cancer Paternal Aunt     SOCIAL HISTORY:   Social History   Socioeconomic History   Marital status: Single    Spouse name: Not on file   Number of children: Not on file   Years of education: Not on file   Highest education level: Not on file  Occupational History   Occupation: Unemployed  Tobacco Use   Smoking status: Former    Packs/day: 1.00    Types: Cigarettes    Quit date: 2023    Years since quitting: 1.1   Smokeless tobacco: Never  Vaping Use   Vaping Use: Never used  Substance and Sexual Activity   Alcohol use: No   Drug use: No   Sexual activity: Not on file  Other Topics Concern   Not on file  Social History Narrative   Lives in [place] with daughter   Social Determinants of Health   Financial Resource Strain: Not on file  Food Insecurity: No Food Insecurity (09/08/2022)   Hunger Vital Sign    Worried About Running Out of Food in the Last Year: Never true    Ran Out of Food in the Last Year: Never true  Transportation Needs: No Transportation Needs (09/08/2022)   PRAPARE - Hydrologist (Medical): No    Lack of Transportation (Non-Medical): No  Physical Activity: Not on file  Stress: Not on file  Social Connections: Not on file  Intimate Partner Violence: Not At Risk (09/08/2022)   Humiliation, Afraid, Rape, and Kick questionnaire    Fear of Current or Ex-Partner: No    Emotionally Abused: No    Physically Abused: No    Sexually Abused: No    ALLERGIES:    Allergies  Allergen Reactions   Gabapentin     Tremors    Bactrim [Sulfamethoxazole-Trimethoprim] Itching    CURRENT MEDICATIONS:    Current Facility-Administered Medications  Medication Dose Route Frequency Provider Last Rate Last Admin   0.9 % NaCl with KCl 20 mEq/ L  infusion   Intravenous Continuous Loletha Grayer, MD 50 mL/hr at 12/03/22 0848  New Bag at 12/03/22 0848   acetaminophen (TYLENOL) tablet 650 mg  650 mg Oral Q6H PRN Loletha Grayer, MD       Or   acetaminophen (TYLENOL) suppository 650 mg  650 mg Rectal Q6H PRN Wieting, Richard, MD       alum & mag hydroxide-simeth (MAALOX/MYLANTA) 200-200-20 MG/5ML suspension 15-30 mL  15-30 mL Oral Q2H PRN Wieting, Richard, MD       atorvastatin (LIPITOR) tablet 80 mg  80 mg Oral QHS Wieting, Richard, MD       bisacodyl (DULCOLAX) EC tablet 5 mg  5 mg Oral Daily PRN Loletha Grayer, MD       [  START ON 12/04/2022] ferrous sulfate tablet 325 mg  325 mg Oral Q breakfast Loletha Grayer, MD       heparin ADULT infusion 100 units/mL (25000 units/239m)  1,200 Units/hr Intravenous Continuous CBenita Gutter RPH 12 mL/hr at 12/03/22 0955 1,200 Units/hr at 12/03/22 [id number]  HYDROcodone-acetaminophen (NORCO/VICODIN) 5-325 MG per tablet 1-2 tablet  1-2 tablet Oral Q4H PRN WLoletha Grayer MD       ipratropium-albuterol (DUONEB) 0.5-2.5 (3) MG/3ML nebulizer solution 3 mL  3 mL Nebulization Q6H PRN WLoletha Grayer MD       isosorbide mononitrate (IMDUR) 24 hr tablet 60 mg  60 mg Oral Daily WLoletha Grayer MD   60 mg at 12/03/22 0949   lidocaine (LIDODERM) 5 % 1 patch  1 patch Transdermal Q24H WLoletha Grayer MD   1 patch at 12/03/22 1052   metoprolol tartrate (LOPRESSOR) tablet 12.5 mg  12.5 mg Oral BID WLoletha Grayer MD   12.5 mg at 12/03/22 0949   morphine (MS CONTIN) 12 hr tablet 15 mg  15 mg Oral Q12H WLoletha Grayer MD   15 mg at 12/03/22 0949   multivitamin with minerals tablet 1 tablet  1 tablet Oral Daily WLoletha Grayer MD   1 tablet at 12/03/22 0949   ondansetron (ZOFRAN) tablet 4 mg  4 mg Oral Q8H PRN WLoletha Grayer MD       pantoprazole (PROTONIX) EC tablet 40 mg  40 mg Oral Daily WLoletha Grayer MD   40 mg at 12/03/22 1052   piperacillin-tazobactam (ZOSYN) IVPB 3.375 g  3.375 g Intravenous Q8H CBenita Gutter RPH       pregabalin (LYRICA) capsule 200 mg  200 mg Oral  TID WLoletha Grayer MD   200 mg at 12/03/22 1052   [START ON 12/04/2022] vancomycin (VANCOREADY) IVPB 1250 mg/250 mL  1,250 mg Intravenous Q24H [facility]      Current Outpatient Medications  Medication Sig Dispense Refill   alum & mag hydroxide-simeth (MAALOX/MYLANTA) 200-200-20 MG/5ML suspension Take 15-30 mLs by mouth every 2 (two) hours as needed for indigestion. 355 mL 0   apixaban (ELIQUIS) 5 MG TABS tablet Take 1 tablet (5 mg total) by mouth 2 (two) times daily. 60 tablet 11   aspirin EC 81 MG tablet Take 81 mg by mouth daily. Swallow whole.     atorvastatin (LIPITOR) 80 MG tablet Take 80 mg by mouth at bedtime.     bisacodyl (DULCOLAX) 5 MG EC tablet Take 1 tablet (5 mg total) by mouth daily as needed for moderate constipation. 30 tablet 0   cefTRIAXone (ROCEPHIN) 1 g injection Inject 1 g into the muscle daily.     ciprofloxacin (CIPRO) 250 MG tablet Take 3 tablets (750 mg total) by mouth 2 (two) times daily. 60 tablet 0   citalopram (CELEXA) 40 MG tablet Take 1 tablet (40 mg total) by mouth daily. 30 tablet 2   COMBIVENT RESPIMAT 20-100 MCG/ACT AERS respimat Inhale 1 puff into the lungs every 6 (six) hours as needed.     esomeprazole (NEXIUM) 20 MG capsule Take 20 mg by mouth daily.     feeding supplement (ENSURE ENLIVE / ENSURE PLUS) LIQD Take 237 mLs by mouth 2 (two) times daily between meals. 237 mL 12   ferrous sulfate 325 (65 FE) MG tablet Take 325 mg by mouth daily.     furosemide (LASIX) 40 MG tablet Take 0.5 tablets (20 mg total) by mouth daily. 30 tablet    HYDROcodone-acetaminophen (NORCO/VICODIN)  5-325 MG tablet Take 1-2 tablets by mouth every 4 (four) hours as needed for severe pain or moderate pain (1 pill for moderate pain, 2 for severe). 30 tablet 0   isosorbide mononitrate (IMDUR) 60 MG 24 hr tablet Take 60 mg by mouth daily.     lidocaine (LIDODERM) 5 % Place 1 patch onto the skin daily. Remove & Discard patch within 12 hours or as directed by MD 30 patch 0    metoprolol tartrate (LOPRESSOR) 25 MG tablet Take 0.5 tablets (12.5 mg total) by mouth 2 (two) times daily. Hold if SBP is 100 or less or HR is less than 60. Notify Provider     morphine (MS CONTIN) 15 MG 12 hr tablet Take 1 tablet (15 mg total) by mouth every 12 (twelve) hours. 10 tablet 0   Multiple Vitamin (MULTIVITAMIN WITH MINERALS) TABS tablet Take 1 tablet by mouth daily. 30 tablet 0   naloxegol oxalate (MOVANTIK) 12.5 MG TABS tablet Take 1 tablet (12.5 mg total) by mouth daily. 30 tablet 0   ondansetron (ZOFRAN) 4 MG tablet Take 4 mg by mouth every 8 (eight) hours as needed for nausea or vomiting.     polyethylene glycol (MIRALAX / GLYCOLAX) 17 g packet Take 17 g by mouth daily. 14 each 0   pregabalin (LYRICA) 200 MG capsule Take 1 capsule (200 mg total) by mouth in the morning, at noon, and at bedtime. 90 capsule 3    REVIEW OF SYSTEMS:   [X]$  denotes positive finding, [ ]$  denotes negative finding Cardiac  Comments:  Chest pain or chest pressure:    Shortness of breath upon exertion:    Short of breath when lying flat:    Irregular heart rhythm:        Vascular    Pain in calf, thigh, or hip brought on by ambulation:    Pain in feet at night that wakes you up from your sleep:     Blood clot in your veins:    Leg swelling:         Pulmonary    Oxygen at home:    Productive cough:     Wheezing:         Neurologic    Sudden weakness in arms or legs:     Sudden numbness in arms or legs:     Sudden onset of difficulty speaking or slurred speech:    Temporary loss of vision in one eye:     Problems with dizziness:         Gastrointestinal    Blood in stool:      Vomited blood:         Genitourinary    Burning when urinating:     Blood in urine:        Psychiatric    Major depression:         Hematologic    Bleeding problems:    Problems with blood clotting too easily:        Skin    Rashes or ulcers:        Constitutional    Fever or chills:     PHYSICAL  EXAM:   Vitals:   12/03/22 0714 12/03/22 0949 12/03/22 1000 12/03/22 1030  BP:  117/65 (!) 120/95 108/74  Pulse:  78 76 79  Resp: (!) 22  19   Temp:      TempSrc:      SpO2:   96% 95%  Weight:  Height:        GENERAL: The patient is a well-nourished female, in no acute distress. The vital signs are documented above. CARDIAC: There is a regular rate and rhythm.  PULMONARY: Nonlabored respirations ABDOMEN: Soft and non-tender  MUSCULOSKELETAL: There are no major deformities or cyanosis. NEUROLOGIC: No focal weakness or paresthesias are detected. SKIN: see photo below. PSYCHIATRIC: The patient has a normal affect.  STUDIES:   I have reviewed the following: CT, right leg: 1. Increasingly eroded appearance of the distal 5 cm of the femoral shaft to the resection margin, with increased raised periosteal reaction posteriorly, consistent with stump osteomyelitis. 2. 7 x 6 x 7.4 cm complex collection of mixed attenuation and a thick wall continues to surround the distal femoral stump. A portion of this appears to extend into the intramedullary space of the stump. 3. There is new demonstration of a tract from the collection to an open wound on the anterior skin surface of the distal thigh. No soft tissue gas is seen in the collection or in the subcutaneous tissues but an infectious collection is considered present until proven otherwise. 4. Increasing prominence of right inguinal chain nodes some of which measure up to 1 cm in short axis. 5. Large ventral hernia again sags to the right with multiple small and large bowel loops within both with no dilated bowel in the hernia sac or visualized abdomen.  CT abdomen pelvis: 1. No acute findings in the abdomen or pelvis. Specifically, no findings to explain the patient's history of left lower quadrant pain and diarrhea. 2. 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 Extra anatomic bypass graft  left thoracoabdominal wall. Fluid collection around the graft again noted, slightly increased in the interval. 3. Stable large wide-mouth ventral hernia containing small bowel and colon without complicating features. 4. Intra and extrahepatic biliary duct dilatation is similar to prior with gradual tapering of the common bile duct in the head of the pancreas measuring 7-8 mm diameter just proximal to the ampulla. This may be related to prior cholecystectomy. Correlation with liver function test may prove helpful. 5.  Aortic Atherosclerosis (ICD10-I70.0).    ASSESSMENT and PLAN   The patient has osteomyelitis and a fluid collection around her distal stump which has a open wound.  She is having significant pain in this area.  She at minimum is going to require repeat I&D but may potentially need a more proximal revision of her amputation.  She is very tearful at the thought of having to have more surgery however I think it is necessary and she understands.  She has been transition to heparin.  I will make her n.p.o. after midnight tonight in case we can get her added on tomorrow.   Leia Alf, MD, FACS [facility] [phone] Pager [phone]

## 2022-12-03 NOTE — Assessment & Plan Note (Signed)
Replaced 

## 2022-12-03 NOTE — H&P (Signed)
History and Physical    Patient: Carla [name] [id number] DOB: September 16, 1961 DOA: 12/03/2022 DOS: the patient was seen and examined on 12/03/2022 [facility]CP: [facility]  Patient coming from: Home  Chief Complaint:  Chief Complaint  Patient presents with   Chest Pain    Pt family called out for complaints of pt "feeling like heart is fluttering" family gave 325asa and 1 SL nitro. Hx afib. Denies pain at this time.   HPI: Carla [name] is a 62 y.o. female with medical history significant of CHF A-fib peripheral vascular disease COPD hypertension hyperlipidemia arthritis scoliosis and depression.  She complains of leg pain her heart fluttering dizziness and shortness of breath.  For the past 2-1/2 months she is having open wound on her right leg.  Her last dose of Eliquis was last night.  Also been very fatigued and sweating.  Has not noticed a fever.  CT scan of the femur shows stump osteomyelitis, complex fluid collection measuring 7 x 6 x 7.4 cm, tract from the collection to the open wound.  Patient was started on vancomycin and Zosyn and hospitalist services contacted for further evaluation. Review of Systems: Review of Systems  Constitutional:  Positive for chills and malaise/fatigue. Negative for fever.  HENT:  Negative for congestion and sore throat.   Eyes:  Negative for blurred vision.  Respiratory:  Positive for shortness of breath. Negative for cough.   Cardiovascular:  Positive for palpitations. Negative for chest pain.  Gastrointestinal:  Positive for abdominal pain and diarrhea. Negative for nausea and vomiting.  Genitourinary:  Negative for dysuria.  Musculoskeletal:  Positive for joint pain and myalgias.  Skin:  Negative for rash.  Neurological:  Positive for weakness.  Endo/Heme/Allergies:  Does not bruise/bleed easily.  Psychiatric/Behavioral:  Positive for depression.     Past Medical History:  Diagnosis Date   Abdominal aortic atherosclerosis ([place])    a.  05/2017 CTA abd/pelvis: significant atherosclerotic dzs of the infrarenal abd Ao w/ some mural thrombus. No aneurysm or dissection.   Acute focal ischemia of small intestine (HCC)    Acute right-sided low back pain with right-sided sciatica 06/13/2017   AKI (acute kidney injury) ([place]) 07/29/2017   Anemia    Anginal pain ([place])    a. 08/2012 Lexiscan MV: EF 54%, non ischemia/infarct.   Anxiety and depression    Baker's cyst of knee, right    a. 07/2016 U/S: 4.1 x 1.4 x 2.9 cystic structure in R poplitetal fossa.   Bell's palsy    CAD (coronary artery disease)    CHF (congestive heart failure) ([place])    a.) TTE 06/20/2017: EF 60-65%, no rwma, G1DD, no source of cardiac emboli.   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 05/2017 CTA Abd/pelvis: apparent thrombus or embolus in prox SMA (70-90%); b. 05/2017 catheter directed tPA, mechanical thrombectomy, and stenting of the SMA.   Essential hypertension with goal blood pressure less than 140/90 01/19/2016   Gastroesophageal reflux disease 01/19/2016   GERD (gastroesophageal reflux disease)    H/O colectomy    History of 2019 novel coronavirus disease (COVID-19) 11/14/2019   History of kidney stones    Hyperlipidemia    Hypertension    Morbid obesity with BMI of  40.0-44.9, adult [facility])    NSTEMI (non-ST elevated myocardial infarction) ([place]) 06/27/2021   a.) high sensitivity troponins trended: 893 --> 1587 --> 1748 --> 868 --> 735 ng/L   Occlusive mesenteric ischemia (HCC) 06/19/2017   Osteoarthritis    PAD (peripheral artery disease) (HCC)    PAF (paroxysmal atrial fibrillation) (HCC)    a.) CHA2DS2-VASc = 6 (sex, CHF, HTN, DVT x2, aortic plaque). b.) rate/rhythm maintained on oral metoprolol tartrate; chronically anticoagulated with standard  dose apixaban.   Palpitations    Peripheral edema    Personal history of other malignant neoplasm of skin 06/01/2016   Pneumonia    Pre-diabetes    Primary osteoarthritis of both knees 06/13/2017   SBO (small bowel obstruction) ([place]) 08/06/2017   Skin cancer of nose    Superior mesenteric artery thrombosis (HCC)    Vitamin D deficiency    Past Surgical History:  Procedure Laterality Date   AMPUTATION Right 04/06/2022   Procedure: AMPUTATION ABOVE KNEE;  Surgeon: Algernon Huxley, MD;  Location: [facility] ORS;  Service: Vascular;  Laterality: Right;   AMPUTATION TOE Left 12/09/2019   Procedure: AMPUTATION TOE MPJ left;  Surgeon: Caroline More, DPM;  Location: [facility];  Service: Podiatry;  Laterality: Left;   APPENDECTOMY     APPLICATION OF WOUND VAC  12/01/2021   Procedure: APPLICATION OF WOUND VAC;  Surgeon: Algernon Huxley, MD;  Location: [facility];  Service: Vascular;;   APPLICATION OF WOUND VAC Right 05/03/2022   Procedure: APPLICATION OF WOUND VAC;  Surgeon: Algernon Huxley, MD;  Location: [facility];  Service: Vascular;  Laterality: Right;   APPLICATION OF WOUND VAC Left 07/23/2022   Procedure: APPLICATION OF WOUND VAC;  Surgeon: Serafina Mitchell, MD;  Location: [facility];  Service: Vascular;  Laterality: Left;  [place]   APPLICATION OF WOUND VAC Right 08/28/2022   Procedure: APPLICATION OF WOUND VAC;  Surgeon: Algernon Huxley, MD;  Location: [facility];  Service: Vascular;  Laterality: Right;   AXILLARY-FEMORAL BYPASS GRAFT Left 07/23/2022   Procedure: BYPASS GRAFT AXILLA-BIFEMORAL;  Surgeon: Serafina Mitchell, MD;  Location: [facility];  Service: Vascular;  Laterality: Left;   CHOLECYSTECTOMY     COLON SURGERY     COLONOSCOPY WITH PROPOFOL N/A 03/23/2022   Procedure: COLONOSCOPY WITH PROPOFOL;  Surgeon: Jonathon Bellows, MD;  Location: [facility];  Service: Gastroenterology;  Laterality: N/A;  rectal bleed   EMBOLECTOMY OR THROMBECTOMY, WITH OR WITHOUT CATHETER; FEMOROPOPLITEAL, AORTOILIAC ARTERY, BY LEG  INCISION N/A 06/21/2020   Left - Decomp Fasciotomy Leg; Ant &/Or Lat Compart Only; Midline - Angiography, Visceral, Selective Or Supraselective (With Or Without Flush Aortogram); Left - Revascularize, Endovasc, Open/Percut, Iliac Artery, Unilat, Initial Vessel; W/Translum Stent, W/Angioplasty; Right - Revascularize, Endovasc, Open/Percut, Iliac Artery, Ea Add`L Ipsilateral; W/Translumin Stent, W/Angioplasty; Location [facility];   ENDARTERECTOMY FEMORAL Left 12/01/2021   Procedure: ENDARTERECTOMY FEMORAL;  Surgeon: Algernon Huxley, MD;  Location: [facility];  Service: Vascular;  Laterality: Left;   ENDOVASCULAR REPAIR/STENT GRAFT Left 12/01/2021   Procedure: ENDOVASCULAR REPAIR/STENT GRAFT;  Surgeon: Algernon Huxley, MD;  Location: [place] [facility];  Service: Cardiovascular;  Laterality: Left;   INCISION AND DRAINAGE OF WOUND Right 05/03/2022   Procedure: IRRIGATION AND DEBRIDEMENT RIGHT STUMP;  Surgeon: Algernon Huxley, MD;  Location: [facility];  Service: Vascular;  Laterality: Right;   IRRIGATION AND DEBRIDEMENT ABSCESS Right 08/28/2022   Procedure: AKA IRRIGATION AND DEBRIDEMENT ABSCESS;  Surgeon: Algernon Huxley, MD;  Location: [facility];  Service: Vascular;  Laterality: Right;   LAPAROTOMY N/A 08/08/2017   Procedure: EXPLORATORY LAPAROTOMY POSSIBLE BOWEL RESECTION;  Surgeon: Jules Husbands, MD;  Location: [facility];  Service: General;  Laterality: N/A;   LEFT HEART CATH AND CORONARY ANGIOGRAPHY N/A 05/17/2021   Procedure: LEFT HEART CATH AND CORONARY ANGIOGRAPHY;  Surgeon: Yolonda Kida, MD;  Location: [place] [facility];  Service: Cardiovascular;  Laterality: N/A;   LOWER EXTREMITY ANGIOGRAPHY Left 11/17/2019   Procedure: LOWER EXTREMITY ANGIOGRAPHY;  Surgeon: Algernon Huxley, MD;  Location: [place] [facility];  Service: Cardiovascular;  Laterality: Left;   LOWER EXTREMITY ANGIOGRAPHY Right 12/13/2020   Procedure: LOWER EXTREMITY ANGIOGRAPHY;  Surgeon: Algernon Huxley, MD;  Location: [place] [facility];  Service:  Cardiovascular;  Laterality: Right;   LOWER EXTREMITY ANGIOGRAPHY Right 01/10/2021   Procedure: LOWER EXTREMITY ANGIOGRAPHY;  Surgeon: Algernon Huxley, MD;  Location: [place] [facility];  Service: Cardiovascular;  Laterality: Right;   LOWER EXTREMITY ANGIOGRAPHY Right 01/24/2021   Procedure: LOWER EXTREMITY ANGIOGRAPHY;  Surgeon: Algernon Huxley, MD;  Location: [place] [facility];  Service: Cardiovascular;  Laterality: Right;   LOWER EXTREMITY ANGIOGRAPHY Bilateral 06/28/2021   Procedure: Lower Extremity Angiography;  Surgeon: Algernon Huxley, MD;  Location: [place] [facility];  Service: Cardiovascular;  Laterality: Bilateral;   LOWER EXTREMITY ANGIOGRAPHY Right 11/28/2021   Procedure: LOWER EXTREMITY ANGIOGRAPHY;  Surgeon: Algernon Huxley, MD;  Location: [place] [facility];  Service: Cardiovascular;  Laterality: Right;   LOWER EXTREMITY ANGIOGRAPHY Left 11/30/2021   Procedure: Lower Extremity Angiography;  Surgeon: Algernon Huxley, MD;  Location: [place] [facility];  Service: Cardiovascular;  Laterality: Left;   LOWER EXTREMITY ANGIOGRAPHY Right 02/22/2022   Procedure: Lower Extremity Angiography;  Surgeon: Algernon Huxley, MD;  Location: [place] [facility];  Service: Cardiovascular;  Laterality: Right;   LOWER EXTREMITY ANGIOGRAPHY Right 03/13/2022   Procedure: Lower Extremity Angiography;  Surgeon: Algernon Huxley, MD;  Location: [place] [facility];  Service: Cardiovascular;  Laterality: Right;   LOWER EXTREMITY ANGIOGRAPHY Right 03/24/2022   Procedure: Lower Extremity Angiography;  Surgeon: Algernon Huxley, MD;  Location: [place] [facility];  Service: Cardiovascular;  Laterality: Right;   LOWER EXTREMITY INTERVENTION N/A 11/19/2019   Procedure: LOWER EXTREMITY INTERVENTION;  Surgeon: Algernon Huxley, MD;  Location: [place] [facility];  Service: Cardiovascular;  Laterality: N/A;   LOWER EXTREMITY INTERVENTION Bilateral 06/29/2021   Procedure: LOWER EXTREMITY INTERVENTION;  Surgeon: Algernon Huxley, MD;  Location: [place] [facility];  Service: Cardiovascular;  Laterality: Bilateral;   TEE WITHOUT CARDIOVERSION N/A 06/22/2017   Procedure: TRANSESOPHAGEAL ECHOCARDIOGRAM (TEE);  Surgeon: Wellington Hampshire, MD;  Location: [facility];  Service: Cardiovascular;  Laterality: N/A;   TOTAL KNEE ARTHROPLASTY Right 07/11/2018   Procedure: TOTAL KNEE ARTHROPLASTY;  Surgeon: Corky Mull, MD;  Location: [facility];  Service: Orthopedics;  Laterality: Right;   VAGINAL HYSTERECTOMY     VISCERAL ARTERY INTERVENTION N/A 06/20/2017   Procedure: Visceral Artery Intervention, possible aortic thrombectomy;  Surgeon: Algernon Huxley, MD;  Location: [place] [facility];  Service: Cardiovascular;  Laterality: N/A;   VISCERAL ARTERY INTERVENTION N/A 01/28/2018   Procedure: VISCERAL ARTERY INTERVENTION;  Surgeon: Algernon Huxley, MD;  Location: [place] [facility];  Service: Cardiovascular;  Laterality: N/A;   Social History:  reports that she quit smoking about 13 months ago. Her smoking use included cigarettes. She smoked an average of 1 pack  per day. She has never used smokeless tobacco. She reports that she does not drink alcohol and does not use drugs.  Allergies  Allergen Reactions   Gabapentin     Tremors    Bactrim [Sulfamethoxazole-Trimethoprim] Itching    Family History  Problem Relation Age of Onset   Hypertension Mother    Heart disease Mother    Heart attack Mother    Breast cancer Mother    Heart disease Father    Hypertension Father    Breast cancer Paternal Aunt     Prior to Admission medications   Medication Sig Start Date End Date Taking? Authorizing Provider  alum & mag hydroxide-simeth (MAALOX/MYLANTA) 200-200-20 MG/5ML suspension Take 15-30 mLs by mouth every 2 (two) hours as needed for indigestion. 08/11/22   Lorella Nimrod, MD  apixaban (ELIQUIS) 5 MG TABS tablet Take 1 tablet (5 mg total) by mouth 2 (two) times daily. 01/25/21   Stegmayer, Janalyn Harder, PA-C  aspirin EC 81 MG tablet Take 81 mg by mouth  daily. Swallow whole.    [provider]  atorvastatin (LIPITOR) 80 MG tablet Take 80 mg by mouth at bedtime.    [provider]  bisacodyl (DULCOLAX) 5 MG EC tablet Take 1 tablet (5 mg total) by mouth daily as needed for moderate constipation. 09/08/22   Emeterio Reeve, DO  cefTRIAXone (ROCEPHIN) 1 g injection Inject 1 g into the muscle daily. 09/06/22   [provider]  ciprofloxacin (CIPRO) 250 MG tablet Take 3 tablets (750 mg total) by mouth 2 (two) times daily. 11/03/22   Kris Hartmann, NP  citalopram (CELEXA) 40 MG tablet Take 1 tablet (40 mg total) by mouth daily. 02/04/22 09/07/22  Enzo Bi, MD  COMBIVENT RESPIMAT 20-100 MCG/ACT AERS respimat Inhale 1 puff into the lungs every 6 (six) hours as needed. 03/27/22   [provider]  esomeprazole (NEXIUM) 20 MG capsule Take 20 mg by mouth daily. 07/06/22   [provider]  feeding supplement (ENSURE ENLIVE / ENSURE PLUS) LIQD Take 237 mLs by mouth 2 (two) times daily between meals. 05/06/22   Fritzi Mandes, MD  ferrous sulfate 325 (65 FE) MG tablet Take 325 mg by mouth daily.    [provider]  furosemide (LASIX) 40 MG tablet Take 0.5 tablets (20 mg total) by mouth daily. 08/11/22   Lorella Nimrod, MD  HYDROcodone-acetaminophen (NORCO/VICODIN) 5-325 MG tablet Take 1-2 tablets by mouth every 4 (four) hours as needed for severe pain or moderate pain (1 pill for moderate pain, 2 for severe). 11/17/22   Kris Hartmann, NP  isosorbide mononitrate (IMDUR) 60 MG 24 hr tablet Take 60 mg by mouth daily. 01/23/22   [provider]  lidocaine (LIDODERM) 5 % Place 1 patch onto the skin daily. Remove & Discard patch within 12 hours or as directed by MD 08/11/22   Lorella Nimrod, MD  metoprolol tartrate (LOPRESSOR) 25 MG tablet Take 0.5 tablets (12.5 mg total) by mouth 2 (two) times daily. Hold if SBP is 100 or less or HR is less than 60. Notify Provider 09/08/22   Emeterio Reeve, DO  morphine (MS  CONTIN) 15 MG 12 hr tablet Take 1 tablet (15 mg total) by mouth every 12 (twelve) hours. 09/08/22   Emeterio Reeve, DO  Multiple Vitamin (MULTIVITAMIN WITH MINERALS) TABS tablet Take 1 tablet by mouth daily. 03/25/22   Fritzi Mandes, MD  naloxegol oxalate (MOVANTIK) 12.5 MG TABS tablet Take 1 tablet (12.5 mg total) by mouth daily.  09/09/22   Emeterio Reeve, DO  ondansetron (ZOFRAN) 4 MG tablet Take 4 mg by mouth every 8 (eight) hours as needed for nausea or vomiting.    [provider]  polyethylene glycol (MIRALAX / GLYCOLAX) 17 g packet Take 17 g by mouth daily. 08/12/22   Lorella Nimrod, MD  pregabalin (LYRICA) 200 MG capsule Take 1 capsule (200 mg total) by mouth in the morning, at noon, and at bedtime. 10/04/22 02/01/23  Kris Hartmann, NP    Physical Exam: Vitals:   12/03/22 0430 12/03/22 0500 12/03/22 0620 12/03/22 0714  BP: (!) 133/91 118/76 128/74   Pulse: 94 (!) 107 80   Resp: 20 15 20 $ (!) 22  Temp:      TempSrc:      SpO2: 95% 97% 95%   Weight:      Height:       Physical Exam HENT:     Head: Normocephalic.     Mouth/Throat:     Pharynx: No oropharyngeal exudate.  Eyes:     General: Lids are normal.     Conjunctiva/sclera: Conjunctivae normal.  Cardiovascular:     Rate and Rhythm: Normal rate and regular rhythm.     Heart sounds: Normal heart sounds, S1 normal and S2 normal.  Pulmonary:     Breath sounds: No decreased breath sounds, wheezing, rhonchi or rales.  Abdominal:     Palpations: Abdomen is soft.     Tenderness: There is abdominal tenderness in the right lower quadrant and left lower quadrant.  Musculoskeletal:     Left lower leg: No swelling.     Right Lower Extremity: Right leg is amputated above knee.  Skin:    General: Skin is warm.     Findings: No rash.  Neurological:     Mental Status: She is alert and oriented to person, place, and time.     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  Likely will need an AKA revision.  Case discussed with vascular surgery.  Since her last dose of Eliquis was last night, we will bridge with heparin and plan on being n.p.o. after midnight tonight for possible surgery tomorrow.  Will get a wound culture.  Empiric antibiotics with vancomycin and Zosyn.  Hypokalemia Replace potassium orally  PVD (peripheral vascular disease) ([place]) Patient has a predisposition for developing blood clots will bridge with heparin drip at this point.  Paroxysmal atrial fibrillation (HCC) With holding Eliquis for possible surgery will bridge with heparin drip at this point.  On metoprolol for rate control.  Hyperlipidemia, unspecified Continue statin  Chronic pain Continue her usual pain regimen.  Obesity, Class III, BMI 40-49.9 (morbid obesity) (HCC) BMI 40.24 with current height and weight in computer  Diarrhea Send stool studies.  Hold MiraLAX.  Neuropathy Continue Lyrica  GERD (gastroesophageal reflux disease) Continue PPI  Essential hypertension Continue metoprolol      Advance Care  Planning: Full code  Consults: Vascular  Family Communication: Updated daughter on the phone  Severity of Illness: The appropriate patient status for this patient is INPATIENT. Inpatient status is judged to be reasonable and necessary in order to provide the required intensity of service to ensure the  patient's safety. The patient's presenting symptoms, physical exam findings, and initial radiographic and laboratory data in the context of their chronic comorbidities is felt to place them at high risk for further clinical deterioration. Furthermore, it is not anticipated that the patient will be medically stable for discharge from the hospital within 2 midnights of admission.   * I certify that at the point of admission it is my clinical judgment that the patient will require inpatient hospital care spanning beyond 2 midnights from the point of admission due to high intensity of service, high risk for further deterioration and high frequency of surveillance required.*  Author: Loletha Grayer, MD 12/03/2022 8:22 AM  For on call review [email or web address].

## 2022-12-03 NOTE — Consult Note (Signed)
[facility] for IV Heparin Indication: atrial fibrillation and Hx DVT   Patient Measurements: Height: 5' 2"$  (157.5 cm) Weight: 99.8 kg (220 lb) IBW/kg (Calculated) : 50.1 Heparin Dosing Weight: 73.8 kg  Labs: Recent Labs    12/03/22 0359 12/03/22 0632  HGB 14.0  --   HCT 44.4  --   PLT 272  --   CREATININE 0.80  --   TROPONINIHS 8 7    Estimated Creatinine Clearance: 79.5 mL/min (by C-G formula based on SCr of 0.8 mg/dL).   Medical History: Past Medical History:  Diagnosis Date   Abdominal aortic atherosclerosis ([place])    a. 05/2017 CTA abd/pelvis: significant atherosclerotic dzs of the infrarenal abd Ao w/ some mural thrombus. No aneurysm or dissection.   Acute focal ischemia of small intestine (HCC)    Acute right-sided low back pain with right-sided sciatica 06/13/2017   AKI (acute kidney injury) ([place]) 07/29/2017   Anemia    Anginal pain ([place])    a. 08/2012 Lexiscan MV: EF 54%, non ischemia/infarct.   Anxiety and depression    Baker's cyst of knee, right    a. 07/2016 U/S: 4.1 x 1.4 x 2.9 cystic structure in R poplitetal fossa.   Bell's palsy    CAD (coronary artery disease)    CHF (congestive heart failure) ([place])    a.) TTE 06/20/2017: EF 60-65%, no rwma, G1DD, no source of cardiac emboli.   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 05/2017 CTA Abd/pelvis: apparent thrombus or embolus in prox SMA (70-90%); b. 05/2017 catheter directed tPA, mechanical thrombectomy, and stenting of the SMA.   Essential hypertension with goal blood pressure less than 140/90 01/19/2016   Gastroesophageal reflux disease 01/19/2016   GERD (gastroesophageal reflux disease)    H/O colectomy    History of 2019 novel coronavirus disease (COVID-19)  11/14/2019   History of kidney stones    Hyperlipidemia    Hypertension    Morbid obesity with BMI of 40.0-44.9, adult ([place])    NSTEMI (non-ST elevated myocardial infarction) ([place]) 06/27/2021   a.) high sensitivity troponins trended: 893 --> 1587 --> 1748 --> 868 --> 735 ng/L   Occlusive mesenteric ischemia ([place]) 06/19/2017   Osteoarthritis    PAD (peripheral artery disease) (HCC)    PAF (paroxysmal atrial fibrillation) (HCC)    a.) CHA2DS2-VASc = 6 (sex, CHF, HTN, DVT x2, aortic plaque). b.) rate/rhythm maintained on oral metoprolol tartrate; chronically anticoagulated with standard dose apixaban.   Palpitations    Peripheral edema    Personal history of other malignant neoplasm of skin 06/01/2016   Pneumonia    Pre-diabetes    Primary osteoarthritis of both knees 06/13/2017   SBO (small bowel obstruction) ([place]) 08/06/2017   Skin cancer of nose    Superior mesenteric artery thrombosis (HCC)    Vitamin D deficiency     Medications:  Apixaban 5 mg BID (last patient reported dose was 2/10 PM)  Assessment: Patient is a 62 y/o F with medical history as above and including Afib and Hx DVT on apixaban who is admitted with Rt AKA stump infection / OM. Patient may need surgical intervention. Pharmacy consulted to initiate and mange heparin infusion while apixaban is on hold.  Baseline aPTT, PT-INR and, heparin level are pending. Baseline H&H, platelets within normal limits.  Goal of Therapy:  Heparin level 0.3-0.7 units/ml aPTT 66 - 102 seconds Monitor platelets by anticoagulation protocol: Yes   Plan:  --Heparin 4000 unit IV bolus followed by continuous infusion at 1200 units/hr --Will check aPTT 6 hours from initiation of infusion --Follow aPTT until correlation established with heparin level. Check heparin level tomorrow AM --Daily CBC per protocol while on IV heparin  Carla [name] 12/03/2022,8:16 AM

## 2022-12-03 NOTE — Assessment & Plan Note (Signed)
BMI 40.24 with current height and weight in computer

## 2022-12-03 NOTE — Assessment & Plan Note (Signed)
Continue her usual pain regimen.

## 2022-12-03 NOTE — Consult Note (Signed)
Pharmacy Antibiotic Note  Carla [name] is a 62 y.o. female with PMH including CAD, PAD s/p Rt AKA, dCHF, COPD, Afib on apixaban admitted on 12/03/2022 with  AKA stump infection / OM . Pharmacy has been consulted for vancomycin and Zosyn dosing.  Plan: Zosyn 3.375g IV q8h (4 hour infusion).  Vancomycin 2 g IV LD followed by maintenance regimen of vancomycin 1.25 g IV q24h --Calculated AUC: 485, Cmin: 11 --Daily Scr per protocol --Levels at steady state or as clinically indicated  Height: 5' 2"$  (157.5 cm) Weight: 99.8 kg (220 lb) IBW/kg (Calculated) : 50.1  Temp (24hrs), Avg:97.9 F (36.6 C), Min:97.9 F (36.6 C), Max:97.9 F (36.6 C)  Recent Labs  Lab 12/03/22 0359  WBC 10.8*  CREATININE 0.80    Estimated Creatinine Clearance: 79.5 mL/min (by C-G formula based on SCr of 0.8 mg/dL).    Allergies  Allergen Reactions   Gabapentin     Tremors    Bactrim [Sulfamethoxazole-Trimethoprim] Itching    Antimicrobials this admission: Zosyn 2/11 >>  Vancomycin 2/11 >>   Dose adjustments this admission: N/A  Microbiology results: Wound culture: pending  Thank you for allowing pharmacy to be a part of this patient's care.  Benita Gutter 12/03/2022 8:03 AM

## 2022-12-03 NOTE — Assessment & Plan Note (Signed)
Continue metoprolol. 

## 2022-12-03 NOTE — Assessment & Plan Note (Signed)
Continue statin. 

## 2022-12-03 NOTE — ED Triage Notes (Signed)
Pt family called out for complaints of pt "feeling like heart is fluttering" family gave 325asa and 1 SL nitro. Hx afib. Denies pain at this time.

## 2022-12-03 NOTE — Assessment & Plan Note (Signed)
With holding Eliquis for possible surgery will bridge with heparin drip at this point.  On metoprolol for rate control.

## 2022-12-03 NOTE — Consult Note (Signed)
[facility] for IV Heparin Indication: atrial fibrillation and Hx DVT   Patient Measurements: Height: 5' 2"$  (157.5 cm) Weight: 99.8 kg (220 lb) IBW/kg (Calculated) : 50.1 Heparin Dosing Weight: 73.8 kg  Labs: Recent Labs    12/03/22 0359 12/03/22 0632 12/03/22 0832 12/03/22 1715  HGB 14.0  --   --   --   HCT 44.4  --   --   --   PLT 272  --   --   --   APTT  --   --  26 53*  LABPROT  --   --  14.3  --   INR  --   --  1.1  --   HEPARINUNFRC  --   --  0.37 0.64  CREATININE 0.80  --   --   --   TROPONINIHS 8 7  --   --      Estimated Creatinine Clearance: 79.5 mL/min (by C-G formula based on SCr of 0.8 mg/dL).   Medical History: Past Medical History:  Diagnosis Date   Abdominal aortic atherosclerosis ([place])    a. 05/2017 CTA abd/pelvis: significant atherosclerotic dzs of the infrarenal abd Ao w/ some mural thrombus. No aneurysm or dissection.   Acute focal ischemia of small intestine (HCC)    Acute right-sided low back pain with right-sided sciatica 06/13/2017   AKI (acute kidney injury) ([place]) 07/29/2017   Anemia    Anginal pain ([place])    a. 08/2012 Lexiscan MV: EF 54%, non ischemia/infarct.   Anxiety and depression    Baker's cyst of knee, right    a. 07/2016 U/S: 4.1 x 1.4 x 2.9 cystic structure in R poplitetal fossa.   Bell's palsy    CAD (coronary artery disease)    CHF (congestive heart failure) ([place])    a.) TTE 06/20/2017: EF 60-65%, no rwma, G1DD, no source of cardiac emboli.   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 05/2017 CTA Abd/pelvis: apparent thrombus or embolus in prox SMA (70-90%); b. 05/2017 catheter directed tPA, mechanical thrombectomy, and stenting of the SMA.   Essential hypertension with goal blood pressure  less than 140/90 01/19/2016   Gastroesophageal reflux disease 01/19/2016   GERD (gastroesophageal reflux disease)    H/O colectomy    History of 2019 novel coronavirus disease (COVID-19) 11/14/2019   History of kidney stones    Hyperlipidemia    Hypertension    Morbid obesity with BMI of 40.0-44.9, adult ([place])    NSTEMI (non-ST elevated myocardial infarction) ([place]) 06/27/2021   a.) high sensitivity troponins trended: 893 --> 1587 --> 1748 --> 868 --> 735 ng/L   Occlusive mesenteric ischemia ([place]) 06/19/2017   Osteoarthritis    PAD (peripheral artery disease) (HCC)    PAF (paroxysmal atrial fibrillation) (HCC)    a.) CHA2DS2-VASc = 6 (sex, CHF, HTN, DVT x2, aortic plaque). b.) rate/rhythm maintained on oral metoprolol tartrate; chronically anticoagulated with standard dose apixaban.   Palpitations    Peripheral edema    Personal history of other malignant neoplasm of skin 06/01/2016   Pneumonia    Pre-diabetes    Primary osteoarthritis of both knees 06/13/2017   SBO (small bowel obstruction) ([place]) 08/06/2017   Skin cancer of nose  Superior mesenteric artery thrombosis (HCC)    Vitamin D deficiency     Medications:  Apixaban 5 mg BID (last patient reported dose was 2/10 PM)  Assessment: Patient is a 62 y/o F with medical history as above and including Afib and Hx DVT on apixaban who is admitted with Rt AKA stump infection / OM. Patient may need surgical intervention. Pharmacy consulted to initiate and mange heparin infusion while apixaban is on hold.   Baseline aPTT, PT-INR and, heparin level are pending. Baseline H&H, platelets within normal limits.  Goal of Therapy:  Heparin level 0.3-0.7 units/ml aPTT 66 - 102 seconds Monitor platelets by anticoagulation protocol: Yes    aPTT 53, HL 0.64 -- not yet correlating.  Plan: aPTT subtherapeutic --Give heparin 2000 unit IV bolus and increase infusion to 1450 units/hr --Will check aPTT 6 hours from rate change --Follow aPTT  until correlation established with heparin level. Check heparin level tomorrow AM --Daily CBC per protocol while on IV heparin  Wynelle Cleveland 12/03/2022,6:08 PM

## 2022-12-03 NOTE — Assessment & Plan Note (Signed)
Continue PPI ?

## 2022-12-04 ENCOUNTER — Other Ambulatory Visit: Payer: Self-pay

## 2022-12-04 ENCOUNTER — Encounter (INDEPENDENT_AMBULATORY_CARE_PROVIDER_SITE_OTHER): Payer: Self-pay | Admitting: Nurse Practitioner

## 2022-12-04 DIAGNOSIS — M86151 Other acute osteomyelitis, right femur: Secondary | ICD-10-CM | POA: Diagnosis not present

## 2022-12-04 LAB — BASIC METABOLIC PANEL
Anion gap: 9 (ref 5–15)
BUN: 15 mg/dL (ref 8–23)
CO2: 21 mmol/L — ABNORMAL LOW (ref 22–32)
Calcium: 7.9 mg/dL — ABNORMAL LOW (ref 8.9–10.3)
Chloride: 108 mmol/L (ref 98–111)
Creatinine, Ser: 0.88 mg/dL (ref 0.44–1.00)
GFR, Estimated: >60 mL/min (ref >60)
Glucose, Bld: 123 mg/dL — ABNORMAL HIGH (ref 70–99)
Potassium: 3.3 mmol/L — ABNORMAL LOW (ref 3.5–5.1)
Sodium: 138 mmol/L (ref 135–145)

## 2022-12-04 LAB — PROTIME-INR
INR: 1.1 (ref 0.8–1.2)
Prothrombin Time: 13.9 seconds (ref 11.4–15.2)

## 2022-12-04 LAB — CBC
HCT: 34 % — ABNORMAL LOW (ref 36.0–46.0)
Hemoglobin: 10.6 g/dL — ABNORMAL LOW (ref 12.0–15.0)
MCH: 26.9 pg (ref 26.0–34.0)
MCHC: 31.2 g/dL (ref 30.0–36.0)
MCV: 86.3 fL (ref 80.0–100.0)
Platelets: 209 10*3/uL (ref 150–400)
RBC: 3.94 MIL/uL (ref 3.87–5.11)
RDW: 17.4 % — ABNORMAL HIGH (ref 11.5–15.5)
WBC: 6.6 10*3/uL (ref 4.0–10.5)
nRBC: 0 % (ref 0.0–0.2)

## 2022-12-04 LAB — APTT
aPTT: 78 seconds — ABNORMAL HIGH (ref 24–36)
aPTT: 98 seconds — ABNORMAL HIGH (ref 24–36)

## 2022-12-04 LAB — HEPARIN LEVEL (UNFRACTIONATED): Heparin Unfractionated: 0.95 IU/mL — ABNORMAL HIGH (ref 0.30–0.70)

## 2022-12-04 MED ORDER — HYDROCODONE-ACETAMINOPHEN 7.5-325 MG PO TABS
1.0000 | ORAL_TABLET | ORAL | Status: DC | PRN
  Administered 2022-12-04 – 2022-12-09 (×22): 1 via ORAL
  Filled 2022-12-04 (×22): qty 1

## 2022-12-04 MED ORDER — POTASSIUM CHLORIDE 20 MEQ PO PACK
40.0000 meq | PACK | Freq: Once | ORAL | Status: AC
  Administered 2022-12-04: 40 meq via ORAL
  Filled 2022-12-04: qty 2

## 2022-12-04 NOTE — Progress Notes (Signed)
Subjective:    Patient ID: Carla [name], female    DOB: 1961/04/11, 62 y.o.   MRN: [id number] Chief Complaint  Patient presents with   Follow-up    2 week wound check    Carla [name] is a 62 year old female who presents today as a return following a recent debridement of her right above-knee amputation site.  She was found to have a hematoma which was causing her significant pain.  The patient underwent debridement and removal of the hematoma and she has had a wound VAC placement.  Previously the wound VAC was giving her issues due to the home health nurse.  There were concerns about proper placement.  She now has a new nurse and placement is improved.  The previous tunneling wound is getting smaller in size.  Currently there is no purulent drainage.  There is good granulation.    Review of Systems  Skin:  Positive for wound.  All other systems reviewed and are negative.      Objective:   Physical Exam Vitals reviewed.  HENT:     Head: Normocephalic.  Cardiovascular:     Rate and Rhythm: Normal rate.  Pulmonary:     Effort: Pulmonary effort is normal.  Musculoskeletal:     Right Lower Extremity: Right leg is amputated above knee.  Skin:    General: Skin is warm and dry.  Neurological:     Mental Status: She is alert and oriented to person, place, and time.  Psychiatric:        Mood and Affect: Mood normal.        Behavior: Behavior normal.        Thought Content: Thought content normal.        Judgment: Judgment normal.     BP (!) 142/80 (BP Location: Left Arm)   Pulse 91   Resp 16   Past Medical History:  Diagnosis Date   Abdominal aortic atherosclerosis ([place])    a. 05/2017 CTA abd/pelvis: significant atherosclerotic dzs of the infrarenal abd Ao w/ some mural thrombus. No aneurysm or dissection.   Acute focal ischemia of small intestine (HCC)    Acute right-sided low back pain with right-sided sciatica 06/13/2017   AKI (acute kidney injury) ([place]) 07/29/2017    Anemia    Anginal pain ([place])    a. 08/2012 Lexiscan MV: EF 54%, non ischemia/infarct.   Anxiety and depression    Baker's cyst of knee, right    a. 07/2016 U/S: 4.1 x 1.4 x 2.9 cystic structure in R poplitetal fossa.   Bell's palsy    CAD (coronary artery disease)    CHF (congestive heart failure) ([place])    a.) TTE 06/20/2017: EF 60-65%, no rwma, G1DD, no source of cardiac emboli.   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 05/2017 CTA Abd/pelvis: apparent thrombus or embolus in prox SMA (70-90%); b. 05/2017 catheter directed tPA, mechanical thrombectomy, and stenting of the SMA.   Essential hypertension with goal blood pressure less than 140/90 01/19/2016   Gastroesophageal reflux disease 01/19/2016   GERD (gastroesophageal reflux disease)    H/O colectomy    History of 2019 novel coronavirus disease (COVID-19) 11/14/2019   History of kidney stones    Hyperlipidemia  Hypertension    Morbid obesity with BMI of 40.0-44.9, adult (HCC)    NSTEMI (non-ST elevated myocardial infarction) ([place]) 06/27/2021   a.) high sensitivity troponins trended: 893 --> 1587 --> 1748 --> 868 --> 735 ng/L   Occlusive mesenteric ischemia (HCC) 06/19/2017   Osteoarthritis    PAD (peripheral artery disease) (HCC)    PAF (paroxysmal atrial fibrillation) (HCC)    a.) CHA2DS2-VASc = 6 (sex, CHF, HTN, DVT x2, aortic plaque). b.) rate/rhythm maintained on oral metoprolol tartrate; chronically anticoagulated with standard dose apixaban.   Palpitations    Peripheral edema    Personal history of other malignant neoplasm of skin 06/01/2016   Pneumonia    Pre-diabetes    Primary osteoarthritis of both knees 06/13/2017   SBO (small bowel obstruction) ([place]) 08/06/2017   Skin cancer of nose    Superior mesenteric  artery thrombosis (HCC)    Vitamin D deficiency     Social History   Socioeconomic History   Marital status: Single    Spouse name: Not on file   Number of children: Not on file   Years of education: Not on file   Highest education level: Not on file  Occupational History   Occupation: Unemployed  Tobacco Use   Smoking status: Former    Packs/day: 1.00    Types: Cigarettes    Quit date: 2023    Years since quitting: 1.1   Smokeless tobacco: Never  Vaping Use   Vaping Use: Never used  Substance and Sexual Activity   Alcohol use: No   Drug use: No   Sexual activity: Not on file  Other Topics Concern   Not on file  Social History Narrative   Lives in [place] with daughter   Social Determinants of Health   Financial Resource Strain: Not on file  Food Insecurity: No Food Insecurity (09/08/2022)   Hunger Vital Sign    Worried About Running Out of Food in the Last Year: Never true    Ran Out of Food in the Last Year: Never true  Transportation Needs: No Transportation Needs (09/08/2022)   PRAPARE - Hydrologist (Medical): No    Lack of Transportation (Non-Medical): No  Physical Activity: Not on file  Stress: Not on file  Social Connections: Not on file  Intimate Partner Violence: Not At Risk (09/08/2022)   Humiliation, Afraid, Rape, and Kick questionnaire    Fear of Current or Ex-Partner: No    Emotionally Abused: No    Physically Abused: No    Sexually Abused: No    Past Surgical History:  Procedure Laterality Date   AMPUTATION Right 04/06/2022   Procedure: AMPUTATION ABOVE KNEE;  Surgeon: Algernon Huxley, MD;  Location: [facility];  Service: Vascular;  Laterality: Right;   AMPUTATION TOE Left 12/09/2019   Procedure: AMPUTATION TOE MPJ left;  Surgeon: Caroline More, DPM;  Location: [facility];  Service: Podiatry;  Laterality: Left;   APPENDECTOMY     APPLICATION OF WOUND VAC  12/01/2021   Procedure: APPLICATION OF WOUND VAC;  Surgeon:  Algernon Huxley, MD;  Location: [facility];  Service: Vascular;;   APPLICATION OF WOUND VAC Right 05/03/2022   Procedure: APPLICATION OF WOUND VAC;  Surgeon: Algernon Huxley, MD;  Location: [facility];  Service: Vascular;  Laterality: Right;   APPLICATION OF WOUND VAC Left 07/23/2022   Procedure: APPLICATION OF WOUND VAC;  Surgeon: Serafina Mitchell, MD;  Location: [facility];  Service: Vascular;  Laterality: Left;  [place]   APPLICATION OF WOUND VAC Right 08/28/2022   Procedure: APPLICATION OF WOUND VAC;  Surgeon: Algernon Huxley, MD;  Location: [facility];  Service: Vascular;  Laterality: Right;   AXILLARY-FEMORAL BYPASS GRAFT Left 07/23/2022   Procedure: BYPASS GRAFT AXILLA-BIFEMORAL;  Surgeon: Serafina Mitchell, MD;  Location: [facility];  Service: Vascular;  Laterality: Left;   CHOLECYSTECTOMY     COLON SURGERY     COLONOSCOPY WITH PROPOFOL N/A 03/23/2022   Procedure: COLONOSCOPY WITH PROPOFOL;  Surgeon: Jonathon Bellows, MD;  Location: [facility];  Service: Gastroenterology;  Laterality: N/A;  rectal bleed   EMBOLECTOMY OR THROMBECTOMY, WITH OR WITHOUT CATHETER; FEMOROPOPLITEAL, AORTOILIAC ARTERY, BY LEG INCISION N/A 06/21/2020   Left - Decomp Fasciotomy Leg; Ant &/Or Lat Compart Only; Midline - Angiography, Visceral, Selective Or Supraselective (With Or Without Flush Aortogram); Left - Revascularize, Endovasc, Open/Percut, Iliac Artery, Unilat, Initial Vessel; W/Translum Stent, W/Angioplasty; Right - Revascularize, Endovasc, Open/Percut, Iliac Artery, Ea Add`L Ipsilateral; W/Translumin Stent, W/Angioplasty; Location [facility];   ENDARTERECTOMY FEMORAL Left 12/01/2021   Procedure: ENDARTERECTOMY FEMORAL;  Surgeon: Algernon Huxley, MD;  Location: [facility];  Service: Vascular;  Laterality: Left;   ENDOVASCULAR REPAIR/STENT GRAFT Left 12/01/2021   Procedure: ENDOVASCULAR REPAIR/STENT GRAFT;  Surgeon: Algernon Huxley, MD;  Location: [place] [facility];  Service: Cardiovascular;  Laterality: Left;   INCISION AND DRAINAGE OF WOUND Right  05/03/2022   Procedure: IRRIGATION AND DEBRIDEMENT RIGHT STUMP;  Surgeon: Algernon Huxley, MD;  Location: [facility];  Service: Vascular;  Laterality: Right;   IRRIGATION AND DEBRIDEMENT ABSCESS Right 08/28/2022   Procedure: AKA IRRIGATION AND DEBRIDEMENT ABSCESS;  Surgeon: Algernon Huxley, MD;  Location: [facility];  Service: Vascular;  Laterality: Right;   LAPAROTOMY N/A 08/08/2017   Procedure: EXPLORATORY LAPAROTOMY POSSIBLE BOWEL RESECTION;  Surgeon: Jules Husbands, MD;  Location: [facility];  Service: General;  Laterality: N/A;   LEFT HEART CATH AND CORONARY ANGIOGRAPHY N/A 05/17/2021   Procedure: LEFT HEART CATH AND CORONARY ANGIOGRAPHY;  Surgeon: Yolonda Kida, MD;  Location: [place] [facility];  Service: Cardiovascular;  Laterality: N/A;   LOWER EXTREMITY ANGIOGRAPHY Left 11/17/2019   Procedure: LOWER EXTREMITY ANGIOGRAPHY;  Surgeon: Algernon Huxley, MD;  Location: [place] [facility];  Service: Cardiovascular;  Laterality: Left;   LOWER EXTREMITY ANGIOGRAPHY Right 12/13/2020   Procedure: LOWER EXTREMITY ANGIOGRAPHY;  Surgeon: Algernon Huxley, MD;  Location: [place] [facility];  Service: Cardiovascular;  Laterality: Right;   LOWER EXTREMITY ANGIOGRAPHY Right 01/10/2021   Procedure: LOWER EXTREMITY ANGIOGRAPHY;  Surgeon: Algernon Huxley, MD;  Location: [place] [facility];  Service: Cardiovascular;  Laterality: Right;   LOWER EXTREMITY ANGIOGRAPHY Right 01/24/2021   Procedure: LOWER EXTREMITY ANGIOGRAPHY;  Surgeon: Algernon Huxley, MD;  Location: [place] [facility];  Service: Cardiovascular;  Laterality: Right;   LOWER EXTREMITY ANGIOGRAPHY Bilateral 06/28/2021   Procedure: Lower Extremity Angiography;  Surgeon: Algernon Huxley, MD;  Location: [place] [facility];  Service: Cardiovascular;  Laterality: Bilateral;   LOWER EXTREMITY ANGIOGRAPHY Right 11/28/2021   Procedure: LOWER EXTREMITY ANGIOGRAPHY;  Surgeon: Algernon Huxley, MD;  Location: [place] [facility];  Service: Cardiovascular;  Laterality: Right;    LOWER EXTREMITY ANGIOGRAPHY Left 11/30/2021   Procedure: Lower Extremity Angiography;  Surgeon: Algernon Huxley, MD;  Location: [place] [facility];  Service: Cardiovascular;  Laterality: Left;   LOWER EXTREMITY ANGIOGRAPHY Right 02/22/2022   Procedure: Lower Extremity Angiography;  Surgeon: [name]  S, MD;  Location: [place] [facility];  Service: Cardiovascular;  Laterality: Right;   LOWER EXTREMITY ANGIOGRAPHY Right 03/13/2022   Procedure: Lower Extremity Angiography;  Surgeon: Algernon Huxley, MD;  Location: [place] [facility];  Service: Cardiovascular;  Laterality: Right;   LOWER EXTREMITY ANGIOGRAPHY Right 03/24/2022   Procedure: Lower Extremity Angiography;  Surgeon: Algernon Huxley, MD;  Location: [place] [facility];  Service: Cardiovascular;  Laterality: Right;   LOWER EXTREMITY INTERVENTION N/A 11/19/2019   Procedure: LOWER EXTREMITY INTERVENTION;  Surgeon: Algernon Huxley, MD;  Location: [place] [facility];  Service: Cardiovascular;  Laterality: N/A;   LOWER EXTREMITY INTERVENTION Bilateral 06/29/2021   Procedure: LOWER EXTREMITY INTERVENTION;  Surgeon: Algernon Huxley, MD;  Location: [place] [facility];  Service: Cardiovascular;  Laterality: Bilateral;   TEE WITHOUT CARDIOVERSION N/A 06/22/2017   Procedure: TRANSESOPHAGEAL ECHOCARDIOGRAM (TEE);  Surgeon: Wellington Hampshire, MD;  Location: [facility];  Service: Cardiovascular;  Laterality: N/A;   TOTAL KNEE ARTHROPLASTY Right 07/11/2018   Procedure: TOTAL KNEE ARTHROPLASTY;  Surgeon: Corky Mull, MD;  Location: [facility];  Service: Orthopedics;  Laterality: Right;   VAGINAL HYSTERECTOMY     VISCERAL ARTERY INTERVENTION N/A 06/20/2017   Procedure: Visceral Artery Intervention, possible aortic thrombectomy;  Surgeon: Algernon Huxley, MD;  Location: [place] [facility];  Service: Cardiovascular;  Laterality: N/A;   VISCERAL ARTERY INTERVENTION N/A 01/28/2018   Procedure: VISCERAL ARTERY INTERVENTION;  Surgeon: Algernon Huxley, MD;  Location: [place]  [facility];  Service: Cardiovascular;  Laterality: N/A;    Family History  Problem Relation Age of Onset   Hypertension Mother    Heart disease Mother    Heart attack Mother    Breast cancer Mother    Heart disease Father    Hypertension Father    Breast cancer Paternal Aunt     Allergies  Allergen Reactions   Gabapentin     Tremors    Bactrim [Sulfamethoxazole-Trimethoprim] Itching       Latest Ref Rng & Units 12/03/2022    3:59 AM 09/08/2022    6:00 AM 09/07/2022    4:21 PM  CBC  WBC 4.0 - 10.5 K/uL 10.8  7.5  8.6   Hemoglobin 12.0 - 15.0 g/dL 14.0  9.5  9.6   Hematocrit 36.0 - 46.0 % 44.4  29.7  30.4   Platelets 150 - 400 K/uL 272  238  236       CMP     Component Value Date/Time   NA 139 12/03/2022 0359   NA 140 02/12/2015 1137   K 3.0 (L) 12/03/2022 0359   K 2.9 (L) 02/12/2015 1137   CL 107 12/03/2022 0359   CL 107 02/12/2015 1137   CO2 21 (L) 12/03/2022 0359   CO2 27 02/12/2015 1137   GLUCOSE 125 (H) 12/03/2022 0359   GLUCOSE 120 (H) 02/12/2015 1137   BUN 14 12/03/2022 0359   BUN 15 02/12/2015 1137   CREATININE 0.80 12/03/2022 0359   CREATININE 0.78 02/12/2015 1137   CALCIUM 9.4 12/03/2022 0359   CALCIUM 9.0 02/12/2015 1137   PROT 7.9 12/03/2022 0359   PROT 7.0 09/14/2020 1139   PROT 7.1 01/19/2015 1112   ALBUMIN 3.7 12/03/2022 0359   ALBUMIN 3.9 09/14/2020 1139   ALBUMIN 3.8 01/19/2015 1112   AST 23 12/03/2022 0359   AST 17 01/19/2015 1112   ALT 15 12/03/2022 0359   ALT 11 (L) 01/19/2015 1112   ALKPHOS 121 12/03/2022  0359   ALKPHOS 84 01/19/2015 1112   BILITOT 1.0 12/03/2022 0359   BILITOT <0.2 09/14/2020 1139   BILITOT 0.5 01/19/2015 1112   GFRNONAA >60 12/03/2022 0359   GFRNONAA >60 02/12/2015 1137   GFRAA >60 11/23/2019 1330   GFRAA >60 02/12/2015 1137     No results found.     Assessment & Plan:    1. S/P AKA (above knee amputation) unilateral, right [facility]) Patient is doing well post removal of hematoma.  The wound is decreasing  in size.  The tunneling is less significant as well.  We will continue with wound VAC dressings.  Patient will return in 2 weeks for wound evaluation.  No current facility-administered medications on file prior to visit.   Current Outpatient Medications on File Prior to Visit  Medication Sig Dispense Refill   alum & mag hydroxide-simeth (MAALOX/MYLANTA) 200-200-20 MG/5ML suspension Take 15-30 mLs by mouth every 2 (two) hours as needed for indigestion. 355 mL 0   apixaban (ELIQUIS) 5 MG TABS tablet Take 1 tablet (5 mg total) by mouth 2 (two) times daily. 60 tablet 11   aspirin EC 81 MG tablet Take 81 mg by mouth daily. Swallow whole.     atorvastatin (LIPITOR) 80 MG tablet Take 80 mg by mouth at bedtime.     bisacodyl (DULCOLAX) 5 MG EC tablet Take 1 tablet (5 mg total) by mouth daily as needed for moderate constipation. 30 tablet 0   cefTRIAXone (ROCEPHIN) 1 g injection Inject 1 g into the muscle daily. (Patient not taking: Reported on 12/03/2022)     ciprofloxacin (CIPRO) 250 MG tablet Take 3 tablets (750 mg total) by mouth 2 (two) times daily. (Patient not taking: Reported on 12/03/2022) 60 tablet 0   COMBIVENT RESPIMAT 20-100 MCG/ACT AERS respimat Inhale 1 puff into the lungs every 6 (six) hours as needed.     esomeprazole (NEXIUM) 20 MG capsule Take 20 mg by mouth daily.     feeding supplement (ENSURE ENLIVE / ENSURE PLUS) LIQD Take 237 mLs by mouth 2 (two) times daily between meals. 237 mL 12   ferrous sulfate 325 (65 FE) MG tablet Take 325 mg by mouth daily.     furosemide (LASIX) 40 MG tablet Take 0.5 tablets (20 mg total) by mouth daily. (Patient taking differently: Take 60 mg by mouth daily.) 30 tablet    HYDROcodone-acetaminophen (NORCO/VICODIN) 5-325 MG tablet Take 1-2 tablets by mouth every 4 (four) hours as needed for severe pain or moderate pain (1 pill for moderate pain, 2 for severe). (Patient not taking: Reported on 12/03/2022) 30 tablet 0   isosorbide mononitrate (IMDUR) 60 MG 24 hr  tablet Take 60 mg by mouth daily.     lidocaine (LIDODERM) 5 % Place 1 patch onto the skin daily. Remove & Discard patch within 12 hours or as directed by MD 30 patch 0   metoprolol tartrate (LOPRESSOR) 25 MG tablet Take 0.5 tablets (12.5 mg total) by mouth 2 (two) times daily. Hold if SBP is 100 or less or HR is less than 60. Notify Provider (Patient taking differently: Take 50 mg by mouth 2 (two) times daily. Hold if SBP is 100 or less or HR is less than 60. Notify Provider)     morphine (MS CONTIN) 15 MG 12 hr tablet Take 1 tablet (15 mg total) by mouth every 12 (twelve) hours. (Patient not taking: Reported on 12/03/2022) 10 tablet 0   Multiple Vitamin (MULTIVITAMIN WITH MINERALS) TABS tablet Take 1 tablet  by mouth daily. (Patient not taking: Reported on 12/03/2022) 30 tablet 0   naloxegol oxalate (MOVANTIK) 12.5 MG TABS tablet Take 1 tablet (12.5 mg total) by mouth daily. (Patient not taking: Reported on 12/03/2022) 30 tablet 0   ondansetron (ZOFRAN) 4 MG tablet Take 4 mg by mouth every 8 (eight) hours as needed for nausea or vomiting. (Patient not taking: Reported on 12/03/2022)     polyethylene glycol (MIRALAX / GLYCOLAX) 17 g packet Take 17 g by mouth daily. 14 each 0   pregabalin (LYRICA) 200 MG capsule Take 1 capsule (200 mg total) by mouth in the morning, at noon, and at bedtime. 90 capsule 3   citalopram (CELEXA) 40 MG tablet Take 1 tablet (40 mg total) by mouth daily. 30 tablet 2    There are no Patient Instructions on file for this visit. No follow-ups on file.   Kris Hartmann, NP

## 2022-12-04 NOTE — ED Notes (Signed)
The pt called out requesting more pain meds at this time. This nurse contacted the NP to ask to see if it's okay to give pt Norco for pain even when pt's BP has been nor (MAP 62-70) all night. Pt states 10/10 on pain scale, pain at wound sight. She described the pain as "something eating away at her wound".

## 2022-12-04 NOTE — Consult Note (Signed)
ANTICOAGULATION CONSULT NOTE  Pharmacy Consult for IV Heparin Indication: atrial fibrillation and Hx DVT   Patient Measurements: Height: 5' 2"$  (157.5 cm) Weight: 99.8 kg (220 lb) IBW/kg (Calculated) : 50.1 Heparin Dosing Weight: 73.8 kg  Labs: Recent Labs    12/03/22 0359 12/03/22 0632 12/03/22 0832 12/03/22 0832 12/03/22 1715 12/04/22 0110 12/04/22 0451 12/04/22 0804  HGB 14.0  --   --   --   --   --  10.6*  --   HCT 44.4  --   --   --   --   --  34.0*  --   PLT 272  --   --   --   --   --  209  --   APTT  --   --  26   < > 53* 78*  --  98*  LABPROT  --   --  14.3  --   --   --  13.9  --   INR  --   --  1.1  --   --   --  1.1  --   HEPARINUNFRC  --   --  0.37  --  0.64  --   --  0.95*  CREATININE 0.80  --   --   --   --   --  0.88  --   TROPONINIHS 8 7  --   --   --   --   --   --    < > = values in this interval not displayed.     Estimated Creatinine Clearance: 72.3 mL/min (by C-G formula based on SCr of 0.88 mg/dL).   Medical History: Past Medical History:  Diagnosis Date   Abdominal aortic atherosclerosis ([place])    a. 05/2017 CTA abd/pelvis: significant atherosclerotic dzs of the infrarenal abd Ao w/ some mural thrombus. No aneurysm or dissection.   Acute focal ischemia of small intestine (HCC)    Acute right-sided low back pain with right-sided sciatica 06/13/2017   AKI (acute kidney injury) ([place]) 07/29/2017   Anemia    Anginal pain ([place])    a. 08/2012 Lexiscan MV: EF 54%, non ischemia/infarct.   Anxiety and depression    Baker's cyst of knee, right    a. 07/2016 U/S: 4.1 x 1.4 x 2.9 cystic structure in R poplitetal fossa.   Bell's palsy    CAD (coronary artery disease)    CHF (congestive heart failure) ([place])    a.) TTE 06/20/2017: EF 60-65%, no rwma, G1DD, no source of cardiac emboli.   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 05/2017 CTA Abd/pelvis: apparent thrombus or embolus in prox SMA (70-90%); b. 05/2017 catheter directed tPA, mechanical thrombectomy, and stenting of the SMA.   Essential hypertension with goal blood pressure less than 140/90 01/19/2016   Gastroesophageal reflux disease 01/19/2016   GERD (gastroesophageal reflux disease)    H/O colectomy    History of 2019 novel coronavirus disease (COVID-19) 11/14/2019   History of kidney stones    Hyperlipidemia    Hypertension    Morbid obesity with BMI of 40.0-44.9, adult ([place])    NSTEMI (non-ST elevated myocardial infarction) ([place]) 06/27/2021   a.) high sensitivity troponins trended: 893 --> 1587 --> 1748 -->  868 --> 735 ng/L   Occlusive mesenteric ischemia ([place]) 06/19/2017   Osteoarthritis    PAD (peripheral artery disease) (HCC)    PAF (paroxysmal atrial fibrillation) (HCC)    a.) CHA2DS2-VASc = 6 (sex, CHF, HTN, DVT x2, aortic plaque). b.) rate/rhythm maintained on oral metoprolol tartrate; chronically anticoagulated with standard dose apixaban.   Palpitations    Peripheral edema    Personal history of other malignant neoplasm of skin 06/01/2016   Pneumonia    Pre-diabetes    Primary osteoarthritis of both knees 06/13/2017   SBO (small bowel obstruction) ([place]) 08/06/2017   Skin cancer of nose    Superior mesenteric artery thrombosis (HCC)    Vitamin D deficiency     Medications:  Apixaban 5 mg BID (last patient reported dose was 2/10 PM)  Assessment: Patient is a 62 y/o F with medical history as above and including Afib and Hx DVT on apixaban who is admitted with Rt AKA stump infection / OM. Patient may need surgical intervention. Pharmacy consulted to initiate and mange heparin infusion while apixaban is on hold.   Baseline aPTT, PT-INR and, heparin level are pending. Baseline H&H, platelets within normal limits.  Goal of Therapy:  Heparin level 0.3-0.7  units/ml aPTT 66 - 102 seconds Monitor platelets by anticoagulation protocol: Yes    2/11 1715 aPTT 53, HL 0.64 -- not yet correlating. 2/12 0110 aPTT 78, therapeutic x 1 2/12 0812 aPTT 98, therapeutic x 2; HL 0.95    Plan: --aPTT therapeutic, still not correlating with HL   --Continue heparin infusion at 1450 units/hr --Follow aPTT until correlation established with heparin level. Check aPTT/heparin level tomorrow AM --Daily CBC per protocol while on IV heparin  Aubery Lapping, PharmD 12/04/2022 8:40 AM

## 2022-12-04 NOTE — Consult Note (Signed)
ANTICOAGULATION CONSULT NOTE  Pharmacy Consult for IV Heparin Indication: atrial fibrillation and Hx DVT   Patient Measurements: Height: 5' 2"$  (157.5 cm) Weight: 99.8 kg (220 lb) IBW/kg (Calculated) : 50.1 Heparin Dosing Weight: 73.8 kg  Labs: Recent Labs    12/03/22 0359 12/03/22 [id number] 12/03/22 0832 12/03/22 1715 12/04/22 0110  HGB 14.0  --   --   --   --   HCT 44.4  --   --   --   --   PLT 272  --   --   --   --   APTT  --   --  26 53* 78*  LABPROT  --   --  14.3  --   --   INR  --   --  1.1  --   --   HEPARINUNFRC  --   --  0.37 0.64  --   CREATININE 0.80  --   --   --   --   TROPONINIHS 8 7  --   --   --      Estimated Creatinine Clearance: 79.5 mL/min (by C-G formula based on SCr of 0.8 mg/dL).   Medical History: Past Medical History:  Diagnosis Date   Abdominal aortic atherosclerosis ([place])    a. 05/2017 CTA abd/pelvis: significant atherosclerotic dzs of the infrarenal abd Ao w/ some mural thrombus. No aneurysm or dissection.   Acute focal ischemia of small intestine (HCC)    Acute right-sided low back pain with right-sided sciatica 06/13/2017   AKI (acute kidney injury) ([place]) 07/29/2017   Anemia    Anginal pain ([place])    a. 08/2012 Lexiscan MV: EF 54%, non ischemia/infarct.   Anxiety and depression    Baker's cyst of knee, right    a. 07/2016 U/S: 4.1 x 1.4 x 2.9 cystic structure in R poplitetal fossa.   Bell's palsy    CAD (coronary artery disease)    CHF (congestive heart failure) ([place])    a.) TTE 06/20/2017: EF 60-65%, no rwma, G1DD, no source of cardiac emboli.   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 05/2017 CTA Abd/pelvis: apparent thrombus or embolus in prox SMA (70-90%); b. 05/2017 catheter directed tPA, mechanical thrombectomy, and stenting of  the SMA.   Essential hypertension with goal blood pressure less than 140/90 01/19/2016   Gastroesophageal reflux disease 01/19/2016   GERD (gastroesophageal reflux disease)    H/O colectomy    History of 2019 novel coronavirus disease (COVID-19) 11/14/2019   History of kidney stones    Hyperlipidemia    Hypertension    Morbid obesity with BMI of 40.0-44.9, adult ([place])    NSTEMI (non-ST elevated myocardial infarction) ([place]) 06/27/2021   a.) high sensitivity troponins trended: 893 --> 1587 --> 1748 --> 868 --> 735 ng/L   Occlusive mesenteric ischemia ([place]) 06/19/2017   Osteoarthritis    PAD (peripheral artery disease) (HCC)    PAF (paroxysmal atrial fibrillation) (HCC)    a.) CHA2DS2-VASc = 6 (sex, CHF, HTN, DVT x2, aortic plaque). b.) rate/rhythm maintained on oral metoprolol tartrate; chronically anticoagulated with standard dose apixaban.   Palpitations    Peripheral edema    Personal history of other malignant neoplasm of skin 06/01/2016   Pneumonia  Pre-diabetes    Primary osteoarthritis of both knees 06/13/2017   SBO (small bowel obstruction) ([place]) 08/06/2017   Skin cancer of nose    Superior mesenteric artery thrombosis (HCC)    Vitamin D deficiency     Medications:  Apixaban 5 mg BID (last patient reported dose was 2/10 PM)  Assessment: Patient is a 62 y/o F with medical history as above and including Afib and Hx DVT on apixaban who is admitted with Rt AKA stump infection / OM. Patient may need surgical intervention. Pharmacy consulted to initiate and mange heparin infusion while apixaban is on hold.   Baseline aPTT, PT-INR and, heparin level are pending. Baseline H&H, platelets within normal limits.  Goal of Therapy:  Heparin level 0.3-0.7 units/ml aPTT 66 - 102 seconds Monitor platelets by anticoagulation protocol: Yes    2/11 1715 aPTT 53, HL 0.64 -- not yet correlating. 2/12 0110 aPTT 78, therapeutic x 1   Plan: --Continue heparin infusion at 1450  units/hr --Will check aPTT 6 hours to confirm --Follow aPTT until correlation established with heparin level. Check heparin level tomorrow AM --Daily CBC per protocol while on IV heparin  Renda Rolls, PharmD, [facility] 12/04/2022 1:36 AM

## 2022-12-04 NOTE — ED Notes (Addendum)
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

## 2022-12-04 NOTE — Progress Notes (Signed)
Progress Note    12/04/2022 2:01 PM * No surgery date entered *  Subjective:  This 62 y.o. female with medical history significant of CHF , A-fib , peripheral vascular disease , COPD,  hypertension,  hyperlipidemia,  arthritis,  scoliosis and depression.  She complains of leg pain, heart fluttering,  dizziness and shortness of breath.  For the past 2-1/2 months she is having open wound on her right leg. CT scan of the femur shows stump osteomyelitis, complex fluid collection measuring 7 x 6 x 7.4 cm, tract from the collection to the open wound.    Vitals:   12/04/22 0900 12/04/22 1000  BP: (!) 121/53 94/61  Pulse: (!) 59 (!) 54  Resp: 12 13  Temp:    SpO2: 95% 95%   Physical Exam: Cardiac:  RRR Lungs:  Clear to auscultation. Respiratory effort normal.  RR 20  Incisions:  Rt AKA stump incision well healed.  Extremities:  Rt AKA with ulceration wound to stump.  Abdomen:  Positive bowel sounds, soft NT Neurologic: A&OX3 Neuro intact.   CBC    Component Value Date/Time   WBC 6.6 12/04/2022 0451   RBC 3.94 12/04/2022 0451   HGB 10.6 (L) 12/04/2022 0451   HGB 13.4 02/12/2015 1137   HCT 34.0 (L) 12/04/2022 0451   HCT 40.9 02/12/2015 1137   PLT 209 12/04/2022 0451   PLT 272 02/12/2015 1137   MCV 86.3 12/04/2022 0451   MCV 90 02/12/2015 1137   MCH 26.9 12/04/2022 0451   MCHC 31.2 12/04/2022 0451   RDW 17.4 (H) 12/04/2022 0451   RDW 14.8 (H) 02/12/2015 1137   LYMPHSABS 2.7 12/03/2022 0359   LYMPHSABS 1.7 08/27/2014 1145   MONOABS 0.8 12/03/2022 0359   MONOABS 0.4 08/27/2014 1145   EOSABS 0.1 12/03/2022 0359   EOSABS 0.0 08/27/2014 1145   BASOSABS 0.1 12/03/2022 0359   BASOSABS 0.0 08/27/2014 1145    BMET    Component Value Date/Time   NA 138 12/04/2022 0451   NA 140 02/12/2015 1137   K 3.3 (L) 12/04/2022 0451   K 2.9 (L) 02/12/2015 1137   CL 108 12/04/2022 0451   CL 107 02/12/2015 1137   CO2 21 (L) 12/04/2022 0451   CO2 27 02/12/2015 1137   GLUCOSE 123 (H)  12/04/2022 0451   GLUCOSE 120 (H) 02/12/2015 1137   BUN 15 12/04/2022 0451   BUN 15 02/12/2015 1137   CREATININE 0.88 12/04/2022 0451   CREATININE 0.78 02/12/2015 1137   CALCIUM 7.9 (L) 12/04/2022 0451   CALCIUM 9.0 02/12/2015 1137   GFRNONAA >60 12/04/2022 0451   GFRNONAA >60 02/12/2015 1137   GFRAA >60 11/23/2019 1330   GFRAA >60 02/12/2015 1137    INR    Component Value Date/Time   INR 1.1 12/04/2022 0451   INR 0.8 07/23/2012 0433     Intake/Output Summary (Last 24 hours) at 12/04/2022 1401 Last data filed at 12/04/2022 0139 Gross per 24 hour  Intake 100 ml  Output --  Net 100 ml  Procedures:    EXAM: CT OF THE LOWER RIGHT EXTREMITY WITH CONTRAST  IMPRESSION: 1. Increasingly eroded appearance of the distal 5 cm of the femoral shaft to the resection margin, with increased raised periosteal reaction posteriorly, consistent with stump osteomyelitis. 2. 7 x 6 x 7.4 cm complex collection of mixed attenuation and a thick wall continues to surround the distal femoral stump. A portion of this appears to extend into the intramedullary space of the stump. 3.  There is new demonstration of a tract from the collection to an open wound on the anterior skin surface of the distal thigh. No soft tissue gas is seen in the collection or in the subcutaneous tissues but an infectious collection is considered present until proven otherwise. 4. Increasing prominence of right inguinal chain nodes some of which measure up to 1 cm in short axis. 5. Large ventral hernia again sags to the right with multiple small and large bowel loops within both with no dilated bowel in the hernia sac or visualized abdomen.     Assessment/Plan:  62 y.o. female who has a history of Rt AKA. She returns to the ED yesterday with chest pain and SOB. She was also noted to have an ulcer/wound on the stump of her right aka. (See media pictures) CT scan was done showing a possible abscess and osteomyelitis. * No  surgery date entered *   PLAN:  Patient is scheduled to go to the operating room on Thursday 12/07/22 for a Right AKA revision and Incision and drainage of an infection.  I discussed the procedure, benefits, risks and complications with the patient. She verbalizes her understanding and would like to proceed. Patient will be made NPO after midnight on 10/07/23.   Today I allowed the patient to eat a heart healthy diet. I adjusted her pain medication to every 4 hours for better pain control.    DVT prophylaxis:  Heparin Infusion.    Drema [facility]ry [facility] 12/04/2022 2:01 PM

## 2022-12-04 NOTE — ED Notes (Signed)
The NP has approved to give the Norco to the pt at this time.

## 2022-12-04 NOTE — Progress Notes (Addendum)
PROGRESS NOTE    Carla [name]  [id number] DOB: 24-Apr-1961 DOA: 12/03/2022 PCP: [facility]   Brief Narrative:  This 62 y.o. female with medical history significant of CHF , A-fib , peripheral vascular disease , COPD,  hypertension,  hyperlipidemia,  arthritis,  scoliosis and depression.  She complains of leg pain, heart fluttering,  dizziness and shortness of breath.  For the past 2-1/2 months she is having open wound on her right leg.  Her last dose of Eliquis was last night.  Also been very fatigued and sweating.  Has not noticed a fever.  CT scan of the femur shows stump osteomyelitis, complex fluid collection measuring 7 x 6 x 7.4 cm, tract from the collection to the open wound.  Patient was started on vancomycin and Zosyn and hospitalist services contacted for further evaluation.  Vascular surgery was consulted.  Patient will need repeat incision and drainage and may potentially need a more proximal revision of her amputation.  Assessment & Plan:   Principal Problem:   Acute osteomyelitis of right thigh (HCC) Active Problems:   Hypokalemia   PVD (peripheral vascular disease) (HCC)   Paroxysmal atrial fibrillation (HCC)   Hyperlipidemia, unspecified   Chronic pain   Obesity, Class III, BMI 40-49.9 (morbid obesity) ([place])   Essential hypertension   GERD (gastroesophageal reflux disease)   Neuropathy   Diarrhea  Acute osteomyelitis of right thigh: She presented with stump infection. CT A/P: Showed fluid collection around the graft slightly increased in the interval. 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  Likely will need an AKA revision.  Case discussed with vascular surgery.  Eliquis was discontinued.  Started on heparin drip.  Continue empiric antibiotic vancomycin and Zosyn.  Follow-up wound care. Vascular surgery consulted, Patient  require repeat incision and drainage, may potentially need more proximal  revision of her amputation.   Hypokalemia: Replaced and resolved.   Peripheral vascular disease: Patient has a predisposition for developing blood clots,  will bridge with heparin drip at this point.   Paroxysmal atrial fibrillation: Eliquis kept on hold. Started on heparin for bridging.  Continue  metoprolol for rate control.   Hyperlipidemia, unspecified Continue statin.   Chronic pain: Continue her pain regimen.   Obesity, Class III, BMI 40-49.9 (morbid obesity) (HCC) BMI 40.24 with current height and weight in computer   Diarrhea: Send stool studies.  Hold MiraLAX.   Neuropathy Continue Lyrica 200 mg three times daily.   GERD (gastroesophageal reflux disease) Continue pantoprazole 40 mg daily.   Essential hypertension Continue metoprolol 12.5 mg q12hr.  DVT prophylaxis: Heparin IV Code Status: Full code. Family Communication: No family at bed side. Disposition Plan:   Status is: Inpatient Remains inpatient appropriate because: Admitted for acute osteomyelitis of the stump.  Vascular surgery consulted patient may require incision and drainage or revision of the stump.   Consultants:  Vascular surgery  Procedures: Antimicrobials:  Anti-infectives (From admission, onward)    Start     Dose/Rate Route Frequency Ordered Stop   12/04/22 1000  vancomycin (VANCOREADY) IVPB 1250 mg/250 mL        1,250 mg 166.7 mL/hr over 90 Minutes Intravenous Every 24 hours 12/03/22 0802     12/03/22 1400  piperacillin-tazobactam (ZOSYN) IVPB 3.375 g        3.375 g 12.5 mL/hr over 240 Minutes Intravenous Every 8 hours 12/03/22 0801     12/03/22 0630  piperacillin-tazobactam (ZOSYN) IVPB 3.375  g        3.375 g 100 mL/hr over 30 Minutes Intravenous  Once 12/03/22 0623 12/03/22 0715   12/03/22 0630  vancomycin (VANCOREADY) IVPB 2000 mg/400 mL        2,000 mg 200 mL/hr over 120 Minutes Intravenous  Once 12/03/22 [id number] 12/03/22 0916      Subjective: Patient was seen and examined  at bedside.  Overnight events noted.   Patient reports doing better,  still reports having significant pain and drainage.  Objective: Vitals:   12/04/22 0800 12/04/22 0900 12/04/22 1000 12/04/22 1400  BP: 105/66 (!) 121/53 94/61 (!) 107/57  Pulse: 60 (!) 59 (!) 54 (!) 54  Resp: 18 12 13 15  $ Temp:    97.7 F (36.5 C)  TempSrc:    Oral  SpO2:  95% 95% 98%  Weight:      Height:        Intake/Output Summary (Last 24 hours) at 12/04/2022 1449 Last data filed at 12/04/2022 0139 Gross per 24 hour  Intake 100 ml  Output --  Net 100 ml   Filed Weights   12/03/22 0319  Weight: 99.8 kg    Examination:  General exam: Appears calm and comfortable, deconditioned. Respiratory system: Clear to auscultation. Respiratory effort normal.  RR 15 Cardiovascular system: S1 & S2 heard, regular rate and rhythm, no murmur. Gastrointestinal system: Abdomen is soft, non tender, non distended, BS+ Central nervous system: Alert and oriented x 3 . No focal neurological deficits. Extremities: Right stump area mildly erythematous with 2 openings with drainage. Skin: No rashes, lesions or ulcers Psychiatry: Judgement and insight appear normal. Mood & affect appropriate.     Data Reviewed: I have personally reviewed following labs and imaging studies  CBC: Recent Labs  Lab 12/03/22 0359 12/04/22 0451  WBC 10.8* 6.6  NEUTROABS 7.2  --   HGB 14.0 10.6*  HCT 44.4 34.0*  MCV 83.8 86.3  PLT 272 [id number]   Basic Metabolic Panel: Recent Labs  Lab 12/03/22 0358 12/03/22 0359 12/04/22 0451  NA  --  139 138  K  --  3.0* 3.3*  CL  --  107 108  CO2  --  21* 21*  GLUCOSE  --  125* 123*  BUN  --  14 15  CREATININE  --  0.80 0.88  CALCIUM  --  9.4 7.9*  MG 2.2  --   --    GFR: Estimated Creatinine Clearance: 72.3 mL/min (by C-G formula based on SCr of 0.88 mg/dL). Liver Function Tests: Recent Labs  Lab 12/03/22 0359  AST 23  ALT 15  ALKPHOS 121  BILITOT 1.0  PROT 7.9  ALBUMIN 3.7   No  results for input(s): "LIPASE", "AMYLASE" in the last 168 hours. No results for input(s): "AMMONIA" in the last 168 hours. Coagulation Profile: Recent Labs  Lab 12/03/22 0832 12/04/22 0451  INR 1.1 1.1   Cardiac Enzymes: No results for input(s): "CKTOTAL", "CKMB", "CKMBINDEX", "TROPONINI" in the last 168 hours. BNP (last 3 results) No results for input(s): "PROBNP" in the last 8760 hours. HbA1C: No results for input(s): "HGBA1C" in the last 72 hours. CBG: No results for input(s): "GLUCAP" in the last 168 hours. Lipid Profile: No results for input(s): "CHOL", "HDL", "LDLCALC", "TRIG", "CHOLHDL", "LDLDIRECT" in the last 72 hours. Thyroid Function Tests: No results for input(s): "TSH", "T4TOTAL", "FREET4", "T3FREE", "THYROIDAB" in the last 72 hours. Anemia Panel: No results for input(s): "VITAMINB12", "FOLATE", "FERRITIN", "TIBC", "IRON", "RETICCTPCT" in the last 72  hours. Sepsis Labs: Recent Labs  Lab 12/03/22 0358  PROCALCITON <0.10    Recent Results (from the past 240 hour(s))  Resp panel by RT-PCR (RSV, Flu A&B, Covid) Anterior Nasal Swab     Status: None   Collection Time: 12/03/22  3:59 AM   Specimen: Anterior Nasal Swab  Result Value Ref Range Status   SARS Coronavirus 2 by RT PCR NEGATIVE NEGATIVE Final    Comment: (NOTE) SARS-CoV-2 target nucleic acids are NOT DETECTED.  The SARS-CoV-2 RNA is generally detectable in upper respiratory specimens during the acute phase of infection. The lowest concentration of SARS-CoV-2 viral copies this assay can detect is 138 copies/mL. A negative result does not preclude SARS-Cov-2 infection and should not be used as the sole basis for treatment or other patient management decisions. A negative result may occur with  improper specimen collection/handling, submission of specimen other than nasopharyngeal swab, presence of viral mutation(s) within the areas targeted by this assay, and inadequate number of viral copies(<138  copies/mL). A negative result must be combined with clinical observations, patient history, and epidemiological information. The expected result is Negative.  Fact Sheet for Patients:  EntrepreneurPulse.com.au  Fact Sheet for Healthcare Providers:  IncredibleEmployment.be  This test is no t yet approved or cleared by the [place] FDA and  has been authorized for detection and/or diagnosis of SARS-CoV-2 by FDA under an Emergency Use Authorization (EUA). This EUA will remain  in effect (meaning this test can be used) for the duration of the COVID-19 declaration under Section 564(b)(1) of the Act, 21 U.S.C.section 360bbb-3(b)(1), unless the authorization is terminated  or revoked sooner.       Influenza A by PCR NEGATIVE NEGATIVE Final   Influenza B by PCR NEGATIVE NEGATIVE Final    Comment: (NOTE) The Xpert Xpress SARS-CoV-2/FLU/RSV plus assay is intended as an aid in the diagnosis of influenza from Nasopharyngeal swab specimens and should not be used as a sole basis for treatment. Nasal washings and aspirates are unacceptable for Xpert Xpress SARS-CoV-2/FLU/RSV testing.  Fact Sheet for Patients: EntrepreneurPulse.com.au  Fact Sheet for Healthcare Providers: IncredibleEmployment.be  This test is not yet approved or cleared by the [place] FDA and has been authorized for detection and/or diagnosis of SARS-CoV-2 by FDA under an Emergency Use Authorization (EUA). This EUA will remain in effect (meaning this test can be used) for the duration of the COVID-19 declaration under Section 564(b)(1) of the Act, 21 U.S.C. section 360bbb-3(b)(1), unless the authorization is terminated or revoked.     Resp Syncytial Virus by PCR NEGATIVE NEGATIVE Final    Comment: (NOTE) Fact Sheet for Patients: EntrepreneurPulse.com.au  Fact Sheet for Healthcare  Providers: IncredibleEmployment.be  This test is not yet approved or cleared by the [place] FDA and has been authorized for detection and/or diagnosis of SARS-CoV-2 by FDA under an Emergency Use Authorization (EUA). This EUA will remain in effect (meaning this test can be used) for the duration of the COVID-19 declaration under Section 564(b)(1) of the Act, 21 U.S.C. section 360bbb-3(b)(1), unless the authorization is terminated or revoked.  Performed at [facility], [address] [place], [place] [address]   Aerobic/Anaerobic Culture w Gram Stain (surgical/deep wound)     Status: None (Preliminary result)   Collection Time: 12/03/22  9:56 AM   Specimen: Abscess; Wound  Result Value Ref Range Status   Specimen Description   Final    ABSCESS Performed at [facility], [address] [place], [place] [address]  Special Requests   Final    Normal Performed at [facility], [address] [place], [place] [address]    Gram Stain   Final    MODERATE WBC PRESENT, PREDOMINANTLY PMN NO ORGANISMS SEEN    Culture   Final    RARE GRAM NEGATIVE RODS CULTURE REINCUBATED FOR BETTER GROWTH Performed at [facility], [place] [address] [place], [place] [address]    Report Status PENDING  Incomplete    Radiology Studies: CT FEMUR RIGHT W CONTRAST  Result Date: 12/03/2022 CLINICAL DATA:  Right lower extremity above the knee amputation, with surface draining wound at the anterior stump and suspected stump abscess. EXAM: CT OF THE LOWER RIGHT EXTREMITY WITH CONTRAST TECHNIQUE: Multidetector CT imaging of the lower right extremity was performed according to the standard protocol following intravenous contrast administration. RADIATION DOSE REDUCTION: This exam was performed according to the departmental dose-optimization program which includes automated exposure control, adjustment of the mA and/or kV according to patient size and/or  use of iterative reconstruction technique. CONTRAST:  165m OMNIPAQUE IOHEXOL 300 MG/ML  SOLN COMPARISON:  Similar study dated 08/26/2022 FINDINGS: Bones/Joint/Cartilage An above-the-knee amputation of the right lower extremity is again noted. The proximal and mid shaft of the femur again demonstrates numerous punctate lucencies in the outer and endosteal cortex, probably due to cortical holes related to disuse osteopenia. 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 There is partial joint space loss of the right hip with acetabular spurs. There are no other findings to suggest osteomyelitis elsewhere. There is mild spurring of the right SI joint. The visualized right hemipelvis is osteopenic but intact. Ligaments Suboptimally assessed by CT. Muscles and Tendons Amputation changes with partial right thigh muscle atrophy. The more proximal tendons at the level of the gluteal insertions and iliopsoas, are unremarkable. There is also partial gluteal muscular atrophy. There is no soft tissue gas in deep intramuscular planes in the thigh. Soft tissues A complex collection of mixed attenuation and a thick wall continues to surround the distal femoral stump. This continues to measure 7 x 6 cm coronal and AP, slightly larger craniocaudal with today's craniocaudal measurement of 7.4 cm, previously 6.7 cm. A portion of this of appears to extend into the intramedullary space of the stump. There is increasing enhancement of its outer wall. There is new demonstration of a tract from the collection to an open wound on the anterior skin surface of the distal thigh on series 3 axial images 252-260, which was not seen previously. No soft tissue gas is seen in the collection or in the subcutaneous tissues but an infectious  complication is considered present until proven otherwise given the additional findings. There is increasing prominence of right inguinal chain nodes some of which measure up to 1 cm in short axis. Surgical clips are again noted in the right groin alongside vascular structures. A large ventral hernia sags to the right as before with multiple small and large bowel loops within a no dilated bowel in the hernia sac or visualized abdomen. There is a stent in proximal right external iliac artery. IMPRESSION: 1. Increasingly eroded appearance of the distal 5 cm of the femoral shaft to the resection margin, with increased raised periosteal reaction posteriorly, consistent with stump osteomyelitis. 2. 7 x 6 x 7.4 cm complex collection of mixed attenuation  and a thick wall continues to surround the distal femoral stump. A portion of this appears to extend into the intramedullary space of the stump. 3. There is new demonstration of a tract from the collection to an open wound on the anterior skin surface of the distal thigh. No soft tissue gas is seen in the collection or in the subcutaneous tissues but an infectious collection is considered present until proven otherwise. 4. Increasing prominence of right inguinal chain nodes some of which measure up to 1 cm in short axis. 5. Large ventral hernia again sags to the right with multiple small and large bowel loops within both with no dilated bowel in the hernia sac or visualized abdomen. Electronically Signed   By: Telford Nab M.D.   On: 12/03/2022 06:13   CT ABDOMEN PELVIS W CONTRAST  Result Date: 12/03/2022 CLINICAL DATA:  Left lower quadrant abdominal pain and diarrhea. EXAM: CT ABDOMEN AND PELVIS WITH CONTRAST TECHNIQUE: Multidetector CT imaging of the abdomen and pelvis was performed using the standard protocol following bolus administration of intravenous contrast. RADIATION DOSE REDUCTION: This exam was performed according to the departmental dose-optimization  program which includes automated exposure control, adjustment of the mA and/or kV according to patient size and/or use of iterative reconstruction technique. CONTRAST:  162m OMNIPAQUE IOHEXOL 300 MG/ML  SOLN COMPARISON:  09/07/2022 FINDINGS: Lower chest: Of the unremarkable. Hepatobiliary: No suspicious focal abnormality within the liver parenchyma. Gallbladder is surgically absent. Intra and extrahepatic biliary duct dilatation is similar to prior with gradual tapering of the common bile duct in the head of the pancreas measuring 7-8 mm diameter just proximal to the ampulla. Pancreas: No focal mass lesion. No dilatation of the main duct. No intraparenchymal cyst. No peripancreatic edema. Spleen: No splenomegaly. No focal mass lesion. Adrenals/Urinary Tract: Right adrenal gland unremarkable. Stable thickening left adrenal gland. Kidneys unremarkable. No evidence for hydroureter. The urinary bladder appears normal for the degree of distention. Stomach/Bowel: Stomach is unremarkable. No gastric wall thickening. No evidence of outlet obstruction. Duodenum is normally positioned as is the ligament of Treitz. No small bowel wall thickening. No small bowel dilatation. The terminal ileum is normal. The appendix is not well visualized, but there is no edema or inflammation in the region of the cecum. No gross colonic mass. No colonic wall thickening. 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 Extra anatomic bypass graft left thoracoabdominal wall. Fluid collection around the graft again noted, slightly increased in the interval. Celiac axis is patent. Stent device noted proximal SMA with occlusion, as described on CTA exam 07/22/2022. Renal arteries are patent. There is no gastrohepatic or hepatoduodenal ligament lymphadenopathy. No retroperitoneal or mesenteric lymphadenopathy. No pelvic sidewall lymphadenopathy. Reproductive: Uterus surgically absent.  There  is no adnexal mass. Other: No intraperitoneal free fluid. Musculoskeletal: No worrisome lytic or sclerotic osseous abnormality. Large wide-mouth ventral hernia containing small bowel and colon again noted without complicating features. IMPRESSION: 1. No acute findings in the abdomen or pelvis. Specifically, no findings to explain the patient's history of left lower quadrant pain and diarrhea. 2. 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 Extra anatomic bypass graft left thoracoabdominal wall. Fluid collection around the graft again noted, slightly increased in the interval. 3. Stable large wide-mouth ventral hernia containing small bowel and colon without complicating features. 4. Intra and extrahepatic biliary duct dilatation is similar to prior with gradual tapering of the common bile duct in the head  of the pancreas measuring 7-8 mm diameter just proximal to the ampulla. This may be related to prior cholecystectomy. Correlation with liver function test may prove helpful. 5.  Aortic Atherosclerosis (ICD10-I70.0). Electronically Signed   By: Misty Stanley M.D.   On: 12/03/2022 05:52   DG Chest Portable 1 View  Result Date: 12/03/2022 CLINICAL DATA:  Chest pain and tachycardia EXAM: PORTABLE CHEST 1 VIEW COMPARISON:  05/02/2022 FINDINGS: Artifact from EKG leads. Normal heart size and mediastinal contours. No acute infiltrate or edema. No effusion or pneumothorax. No acute osseous findings. IMPRESSION: No active disease. Electronically Signed   By: Jorje Guild M.D.   On: 12/03/2022 04:03    Scheduled Meds:  atorvastatin  80 mg Oral QHS   ferrous sulfate  325 mg Oral Q breakfast   isosorbide mononitrate  60 mg Oral Daily   lidocaine  1 patch Transdermal Q24H   metoprolol tartrate  12.5 mg Oral BID   morphine  15 mg Oral Q12H   multivitamin with minerals  1 tablet Oral Daily   pantoprazole  40 mg Oral Daily   pregabalin  200 mg Oral TID   Continuous Infusions:   0.9 % NaCl with KCl 20 mEq / L 50 mL/hr at 12/04/22 0957   heparin 1,450 Units/hr (12/04/22 1302)   piperacillin-tazobactam (ZOSYN)  IV Stopped (12/04/22 0953)   vancomycin Stopped (12/04/22 1118)     LOS: 1 day    Time spent: 50 mins    Sam Wunschel, MD [facility]   If 7PM-7AM, please contact night-coverage

## 2022-12-05 ENCOUNTER — Telehealth (INDEPENDENT_AMBULATORY_CARE_PROVIDER_SITE_OTHER): Payer: Self-pay

## 2022-12-05 DIAGNOSIS — M86151 Other acute osteomyelitis, right femur: Secondary | ICD-10-CM | POA: Diagnosis not present

## 2022-12-05 LAB — BASIC METABOLIC PANEL
Anion gap: 7 (ref 5–15)
BUN: 10 mg/dL (ref 8–23)
CO2: 20 mmol/L — ABNORMAL LOW (ref 22–32)
Calcium: 8 mg/dL — ABNORMAL LOW (ref 8.9–10.3)
Chloride: 112 mmol/L — ABNORMAL HIGH (ref 98–111)
Creatinine, Ser: 0.78 mg/dL (ref 0.44–1.00)
GFR, Estimated: >60 mL/min (ref >60)
Glucose, Bld: 111 mg/dL — ABNORMAL HIGH (ref 70–99)
Potassium: 3.9 mmol/L (ref 3.5–5.1)
Sodium: 139 mmol/L (ref 135–145)

## 2022-12-05 LAB — APTT
aPTT: 107 seconds — ABNORMAL HIGH (ref 24–36)
aPTT: 109 seconds — ABNORMAL HIGH (ref 24–36)
aPTT: 70 seconds — ABNORMAL HIGH (ref 24–36)

## 2022-12-05 LAB — CBC
HCT: 33.9 % — ABNORMAL LOW (ref 36.0–46.0)
Hemoglobin: 10.3 g/dL — ABNORMAL LOW (ref 12.0–15.0)
MCH: 26.2 pg (ref 26.0–34.0)
MCHC: 30.4 g/dL (ref 30.0–36.0)
MCV: 86.3 fL (ref 80.0–100.0)
Platelets: 198 10*3/uL (ref 150–400)
RBC: 3.93 MIL/uL (ref 3.87–5.11)
RDW: 17.3 % — ABNORMAL HIGH (ref 11.5–15.5)
WBC: 6.4 10*3/uL (ref 4.0–10.5)
nRBC: 0 % (ref 0.0–0.2)

## 2022-12-05 LAB — PHOSPHORUS: Phosphorus: 4.1 mg/dL (ref 2.5–4.6)

## 2022-12-05 LAB — HEPARIN LEVEL (UNFRACTIONATED): Heparin Unfractionated: 0.79 IU/mL — ABNORMAL HIGH (ref 0.30–0.70)

## 2022-12-05 LAB — MAGNESIUM: Magnesium: 1.9 mg/dL (ref 1.7–2.4)

## 2022-12-05 NOTE — Consult Note (Signed)
ANTICOAGULATION CONSULT NOTE  Pharmacy Consult for IV Heparin Indication: atrial fibrillation and Hx DVT   Patient Measurements: Height: 5' 2"$  (157.5 cm) Weight: 99.8 kg (220 lb) IBW/kg (Calculated) : 50.1 Heparin Dosing Weight: 73.8 kg  Labs: Recent Labs    12/03/22 0359 12/03/22 0632 12/03/22 0832 12/03/22 0832 12/03/22 1715 12/04/22 0110 12/04/22 0451 12/04/22 0804 12/05/22 0451  HGB 14.0  --   --   --   --   --  10.6*  --  10.3*  HCT 44.4  --   --   --   --   --  34.0*  --  33.9*  PLT 272  --   --   --   --   --  209  --  198  APTT  --   --  26   < > 53* 78*  --  98* 107*  LABPROT  --   --  14.3  --   --   --  13.9  --   --   INR  --   --  1.1  --   --   --  1.1  --   --   HEPARINUNFRC  --   --  0.37   < > 0.64  --   --  0.95* 0.79*  CREATININE 0.80  --   --   --   --   --  0.88  --   --   TROPONINIHS 8 7  --   --   --   --   --   --   --    < > = values in this interval not displayed.     Estimated Creatinine Clearance: 72.3 mL/min (by C-G formula based on SCr of 0.88 mg/dL).   Medical History: Past Medical History:  Diagnosis Date   Abdominal aortic atherosclerosis ([place])    a. 05/2017 CTA abd/pelvis: significant atherosclerotic dzs of the infrarenal abd Ao w/ some mural thrombus. No aneurysm or dissection.   Acute focal ischemia of small intestine (HCC)    Acute right-sided low back pain with right-sided sciatica 06/13/2017   AKI (acute kidney injury) ([place]) 07/29/2017   Anemia    Anginal pain ([place])    a. 08/2012 Lexiscan MV: EF 54%, non ischemia/infarct.   Anxiety and depression    Baker's cyst of knee, right    a. 07/2016 U/S: 4.1 x 1.4 x 2.9 cystic structure in R poplitetal fossa.   Bell's palsy    CAD (coronary artery disease)    CHF (congestive heart failure) ([place])    a.) TTE 06/20/2017: EF 60-65%, no rwma, G1DD, no source of cardiac emboli.   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 05/2017 CTA Abd/pelvis: apparent thrombus or embolus in prox SMA (70-90%); b. 05/2017 catheter directed tPA, mechanical thrombectomy, and stenting of the SMA.   Essential hypertension with goal blood pressure less than 140/90 01/19/2016   Gastroesophageal reflux disease 01/19/2016   GERD (gastroesophageal reflux disease)    H/O colectomy    History of 2019 novel coronavirus disease (COVID-19) 11/14/2019   History of kidney stones    Hyperlipidemia    Hypertension    Morbid obesity with BMI of 40.0-44.9, adult ([place])  NSTEMI (non-ST elevated myocardial infarction) ([place]) 06/27/2021   a.) high sensitivity troponins trended: 893 --> 1587 --> 1748 --> 868 --> 735 ng/L   Occlusive mesenteric ischemia (HCC) 06/19/2017   Osteoarthritis    PAD (peripheral artery disease) (HCC)    PAF (paroxysmal atrial fibrillation) (HCC)    a.) CHA2DS2-VASc = 6 (sex, CHF, HTN, DVT x2, aortic plaque). b.) rate/rhythm maintained on oral metoprolol tartrate; chronically anticoagulated with standard dose apixaban.   Palpitations    Peripheral edema    Personal history of other malignant neoplasm of skin 06/01/2016   Pneumonia    Pre-diabetes    Primary osteoarthritis of both knees 06/13/2017   SBO (small bowel obstruction) ([place]) 08/06/2017   Skin cancer of nose    Superior mesenteric artery thrombosis (HCC)    Vitamin D deficiency     Medications:  Apixaban 5 mg BID (last patient reported dose was 2/10 PM)  Assessment: Patient is a 62 y/o F with medical history as above and including Afib and Hx DVT on apixaban who is admitted with Rt AKA stump infection / OM. Patient may need surgical intervention. Pharmacy consulted to initiate and mange heparin infusion while apixaban is on hold.   Baseline aPTT, PT-INR and, heparin level are pending. Baseline H&H, platelets within  normal limits.  Goal of Therapy:  Heparin level 0.3-0.7 units/ml aPTT 66 - 102 seconds Monitor platelets by anticoagulation protocol: Yes    2/11 1715 aPTT 53, HL 0.64 -- not yet correlating. 2/12 0110 aPTT 78, therapeutic x 1 2/12 0812 aPTT 98, therapeutic x 2; HL 0.95  2/13 0451 aPTT 107, HL = 0.79   Plan: 2/13 @ 0451:  aPTT = 107, HL = 0.79 - aPTT elevated ,  HL elevated from Eliquis PTA  - Will decrease heparin drip to 1350 units/hr and recheck aPTT 6 hrs after rate change  - Will recheck HL on 2/14 with AM labs.  --Follow aPTT until correlation established with heparin level. Check aPTT/heparin level tomorrow AM --Daily CBC per protocol while on IV heparin  Trannie Bardales D 12/05/2022 5:47 AM

## 2022-12-05 NOTE — Progress Notes (Signed)
PROGRESS NOTE    Carla [name]  [id number] DOB: October 06, 1961 DOA: 12/03/2022 PCP: [facility]   Brief Narrative:  This 62 y.o. female with medical history significant of CHF , A-fib , peripheral vascular disease , COPD,  hypertension,  hyperlipidemia,  arthritis,  scoliosis and depression.  She complains of leg pain, heart fluttering,  dizziness and shortness of breath.  For the past 2-1/2 months she is having open wound on her right leg.  Her last dose of Eliquis was last night.  Also been very fatigued and sweating.  Has not noticed a fever.  CT scan of the femur shows stump osteomyelitis, complex fluid collection measuring 7 x 6 x 7.4 cm, tract from the collection to the open wound.  Patient was started on vancomycin and Zosyn and hospitalist services contacted for further evaluation.  Vascular surgery was consulted.  Patient will need repeat incision and drainage and may potentially need a more proximal revision of her amputation, tentatively scheduled for Thursday.  Assessment & Plan:   Principal Problem:   Acute osteomyelitis of right thigh (HCC) Active Problems:   Hypokalemia   PVD (peripheral vascular disease) (HCC)   Paroxysmal atrial fibrillation (HCC)   Hyperlipidemia, unspecified   Chronic pain   Obesity, Class III, BMI 40-49.9 (morbid obesity) ([place])   Essential hypertension   GERD (gastroesophageal reflux disease)   Neuropathy   Diarrhea  Acute osteomyelitis of right thigh: She presented with stump infection. CT A/P: Showed fluid collection around the graft slightly increased in the interval. 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  Likely will need an AKA revision.  Case discussed with vascular surgery.  Eliquis was discontinued.  Started on heparin drip.  Continue empiric antibiotic vancomycin and Zosyn.  Follow-up wound care. Vascular surgery consulted, Patient  require repeat incision and drainage, may  potentially need more proximal revision of her amputation tentatively scheduled for Thursday..   Hypokalemia: Replaced and resolved.   Peripheral vascular disease: Patient has a predisposition for developing blood clots,  will bridge with heparin drip at this point.   Paroxysmal atrial fibrillation: Eliquis kept on hold. Started on heparin for bridging.  Continue  metoprolol for rate control.   Hyperlipidemia, unspecified Continue statin.   Chronic pain: Continue her pain regimen.   Obesity, Class III, BMI 40-49.9 (morbid obesity) (HCC) BMI 40.24 with current height and weight in computer   Diarrhea: Improved No stool studies.  Hold MiraLAX.   Neuropathy Continue Lyrica 200 mg three times daily.   GERD (gastroesophageal reflux disease) Continue pantoprazole 40 mg daily.   Essential hypertension Continue metoprolol 12.5 mg q12hr.  DVT prophylaxis: Heparin IV Code Status: Full code. Family Communication: No family at bed side. Disposition Plan:   Status is: Inpatient Remains inpatient appropriate because: Admitted for acute osteomyelitis of the stump.  Vascular surgery consulted patient may require incision and drainage or revision of the stump, tentatively scheduled for Thursday   Consultants:  Vascular surgery  Procedures: Antimicrobials:  Anti-infectives (From admission, onward)    Start     Dose/Rate Route Frequency Ordered Stop   12/04/22 1000  vancomycin (VANCOREADY) IVPB 1250 mg/250 mL        1,250 mg 166.7 mL/hr over 90 Minutes Intravenous Every 24 hours 12/03/22 0802     12/03/22 1400  piperacillin-tazobactam (ZOSYN) IVPB 3.375 g        3.375 g 12.5 mL/hr over 240 Minutes Intravenous Every 8 hours  12/03/22 0801     12/03/22 0630  piperacillin-tazobactam (ZOSYN) IVPB 3.375 g        3.375 g 100 mL/hr over 30 Minutes Intravenous  Once 12/03/22 0623 12/03/22 0715   12/03/22 0630  vancomycin (VANCOREADY) IVPB 2000 mg/400 mL        2,000 mg 200 mL/hr over  120 Minutes Intravenous  Once 12/03/22 [id number] 12/03/22 0916      Subjective: Patient was seen and examined at bedside.  Overnight events noted.   Patient reports doing better,  still reports having significant pain and drainage. Patient is tentatively scheduled for  Right AKA revision.  Objective: Vitals:   12/04/22 1525 12/04/22 2005 12/05/22 0521 12/05/22 0747  BP: 134/65 133/82 107/63 103/61  Pulse: 82 88 76 72  Resp: 17 16 18 18  $ Temp: 98.4 F (36.9 C) 98 F (36.7 C) 98.3 F (36.8 C) 99 F (37.2 C)  TempSrc:  Oral    SpO2: 98% 98% 96% 97%  Weight:      Height:        Intake/Output Summary (Last 24 hours) at 12/05/2022 1112 Last data filed at 12/05/2022 0647 Gross per 24 hour  Intake 2515.79 ml  Output 1650 ml  Net 865.79 ml   Filed Weights   12/03/22 0319  Weight: 99.8 kg    Examination:  General exam: Appears comfortable, deconditioned, not in any distress. Respiratory system: Clear to auscultation. Respiratory effort normal.  RR 15 Cardiovascular system: S1 & S2 heard, regular rate and rhythm, no murmur. Gastrointestinal system: Abdomen is soft, non tender, non distended, BS+ Central nervous system: Alert and oriented x 3 . No focal neurological deficits. Extremities: Right stump area mildly erythematous with dressing noted. Skin: No rashes, lesions or ulcers Psychiatry: Judgement and insight appear normal. Mood & affect appropriate.     Data Reviewed: I have personally reviewed following labs and imaging studies  CBC: Recent Labs  Lab 12/03/22 0359 12/04/22 0451 12/05/22 0451  WBC 10.8* 6.6 6.4  NEUTROABS 7.2  --   --   HGB 14.0 10.6* 10.3*  HCT 44.4 34.0* 33.9*  MCV 83.8 86.3 86.3  PLT 272 209 [id number]   Basic Metabolic Panel: Recent Labs  Lab 12/03/22 0358 12/03/22 0359 12/04/22 0451 12/05/22 0451  NA  --  139 138 139  K  --  3.0* 3.3* 3.9  CL  --  107 108 112*  CO2  --  21* 21* 20*  GLUCOSE  --  125* 123* 111*  BUN  --  14 15 10   $ CREATININE  --  0.80 0.88 0.78  CALCIUM  --  9.4 7.9* 8.0*  MG 2.2  --   --  1.9  PHOS  --   --   --  4.1   GFR: Estimated Creatinine Clearance: 79.5 mL/min (by C-G formula based on SCr of 0.78 mg/dL). Liver Function Tests: Recent Labs  Lab 12/03/22 0359  AST 23  ALT 15  ALKPHOS 121  BILITOT 1.0  PROT 7.9  ALBUMIN 3.7   No results for input(s): "LIPASE", "AMYLASE" in the last 168 hours. No results for input(s): "AMMONIA" in the last 168 hours. Coagulation Profile: Recent Labs  Lab 12/03/22 0832 12/04/22 0451  INR 1.1 1.1   Cardiac Enzymes: No results for input(s): "CKTOTAL", "CKMB", "CKMBINDEX", "TROPONINI" in the last 168 hours. BNP (last 3 results) No results for input(s): "PROBNP" in the last 8760 hours. HbA1C: No results for input(s): "HGBA1C" in the last 72 hours. CBG:  No results for input(s): "GLUCAP" in the last 168 hours. Lipid Profile: No results for input(s): "CHOL", "HDL", "LDLCALC", "TRIG", "CHOLHDL", "LDLDIRECT" in the last 72 hours. Thyroid Function Tests: No results for input(s): "TSH", "T4TOTAL", "FREET4", "T3FREE", "THYROIDAB" in the last 72 hours. Anemia Panel: No results for input(s): "VITAMINB12", "FOLATE", "FERRITIN", "TIBC", "IRON", "RETICCTPCT" in the last 72 hours. Sepsis Labs: Recent Labs  Lab 12/03/22 0358  PROCALCITON <0.10    Recent Results (from the past 240 hour(s))  Resp panel by RT-PCR (RSV, Flu A&B, Covid) Anterior Nasal Swab     Status: None   Collection Time: 12/03/22  3:59 AM   Specimen: Anterior Nasal Swab  Result Value Ref Range Status   SARS Coronavirus 2 by RT PCR NEGATIVE NEGATIVE Final    Comment: (NOTE) SARS-CoV-2 target nucleic acids are NOT DETECTED.  The SARS-CoV-2 RNA is generally detectable in upper respiratory specimens during the acute phase of infection. The lowest concentration of SARS-CoV-2 viral copies this assay can detect is 138 copies/mL. A negative result does not preclude SARS-Cov-2 infection and  should not be used as the sole basis for treatment or other patient management decisions. A negative result may occur with  improper specimen collection/handling, submission of specimen other than nasopharyngeal swab, presence of viral mutation(s) within the areas targeted by this assay, and inadequate number of viral copies(<138 copies/mL). A negative result must be combined with clinical observations, patient history, and epidemiological information. The expected result is Negative.  Fact Sheet for Patients:  EntrepreneurPulse.com.au  Fact Sheet for Healthcare Providers:  IncredibleEmployment.be  This test is no t yet approved or cleared by the [place] FDA and  has been authorized for detection and/or diagnosis of SARS-CoV-2 by FDA under an Emergency Use Authorization (EUA). This EUA will remain  in effect (meaning this test can be used) for the duration of the COVID-19 declaration under Section 564(b)(1) of the Act, 21 U.S.C.section 360bbb-3(b)(1), unless the authorization is terminated  or revoked sooner.       Influenza A by PCR NEGATIVE NEGATIVE Final   Influenza B by PCR NEGATIVE NEGATIVE Final    Comment: (NOTE) The Xpert Xpress SARS-CoV-2/FLU/RSV plus assay is intended as an aid in the diagnosis of influenza from Nasopharyngeal swab specimens and should not be used as a sole basis for treatment. Nasal washings and aspirates are unacceptable for Xpert Xpress SARS-CoV-2/FLU/RSV testing.  Fact Sheet for Patients: EntrepreneurPulse.com.au  Fact Sheet for Healthcare Providers: IncredibleEmployment.be  This test is not yet approved or cleared by the [place] FDA and has been authorized for detection and/or diagnosis of SARS-CoV-2 by FDA under an Emergency Use Authorization (EUA). This EUA will remain in effect (meaning this test can be used) for the duration of the COVID-19 declaration  under Section 564(b)(1) of the Act, 21 U.S.C. section 360bbb-3(b)(1), unless the authorization is terminated or revoked.     Resp Syncytial Virus by PCR NEGATIVE NEGATIVE Final    Comment: (NOTE) Fact Sheet for Patients: EntrepreneurPulse.com.au  Fact Sheet for Healthcare Providers: IncredibleEmployment.be  This test is not yet approved or cleared by the [place] FDA and has been authorized for detection and/or diagnosis of SARS-CoV-2 by FDA under an Emergency Use Authorization (EUA). This EUA will remain in effect (meaning this test can be used) for the duration of the COVID-19 declaration under Section 564(b)(1) of the Act, 21 U.S.C. section 360bbb-3(b)(1), unless the authorization is terminated or revoked.  Performed at [facility], [place], [place]  [address]   Aerobic/Anaerobic Culture w Gram Stain (surgical/deep wound)     Status: None (Preliminary result)   Collection Time: 12/03/22  9:56 AM   Specimen: Abscess; Wound  Result Value Ref Range Status   Specimen Description   Final    ABSCESS Performed at [facility], [address], [place], [place] [address]    Special Requests   Final    Normal Performed at [facility], [address] [place], [place] [address]    Gram Stain   Final    MODERATE WBC PRESENT, PREDOMINANTLY PMN NO ORGANISMS SEEN    Culture   Final    RARE GRAM NEGATIVE RODS IDENTIFICATION AND SUSCEPTIBILITIES TO FOLLOW Performed at [facility], [address] [place], [place] [address]    Report Status PENDING  Incomplete    Radiology Studies: No results found.  Scheduled Meds:  atorvastatin  80 mg Oral QHS   ferrous sulfate  325 mg Oral Q breakfast   isosorbide mononitrate  60 mg Oral Daily   lidocaine  1 patch Transdermal Q24H   metoprolol tartrate  12.5 mg Oral BID   morphine  15 mg Oral Q12H   multivitamin with minerals  1 tablet Oral Daily    pantoprazole  40 mg Oral Daily   pregabalin  200 mg Oral TID   Continuous Infusions:  0.9 % NaCl with KCl 20 mEq / L Stopped (12/04/22 2155)   heparin 1,350 Units/hr (12/05/22 0546)   piperacillin-tazobactam (ZOSYN)  IV 3.375 g (12/05/22 0544)   vancomycin 1,250 mg (12/05/22 0823)     LOS: 2 days    Time spent: 35 mins    Vicky Schleich, MD [facility]   If 7PM-7AM, please contact night-coverage

## 2022-12-05 NOTE — Telephone Encounter (Signed)
Tiffany from [facility] requested verbal orders for 3 week 9 for re-certification. Orders were approved

## 2022-12-05 NOTE — Consult Note (Signed)
[place] for IV Heparin Indication: atrial fibrillation and Hx DVT   Patient Measurements: Height: 5' 2"$  (157.5 cm) Weight: 99.8 kg (220 lb) IBW/kg (Calculated) : 50.1 Heparin Dosing Weight: 73.8 kg  Labs: Recent Labs    12/03/22 0359 12/03/22 0632 12/03/22 0832 12/03/22 0832 12/03/22 1715 12/04/22 0110 12/04/22 0451 12/04/22 0804 12/05/22 0451 12/05/22 1146 12/05/22 2121  HGB 14.0  --   --   --   --   --  10.6*  --  10.3*  --   --   HCT 44.4  --   --   --   --   --  34.0*  --  33.9*  --   --   PLT 272  --   --   --   --   --  209  --  198  --   --   APTT  --   --  26   < > 53*   < >  --  98* 107* 109* 70*  LABPROT  --   --  14.3  --   --   --  13.9  --   --   --   --   INR  --   --  1.1  --   --   --  1.1  --   --   --   --   HEPARINUNFRC  --   --  0.37   < > 0.64  --   --  0.95* 0.79*  --   --   CREATININE 0.80  --   --   --   --   --  0.88  --  0.78  --   --   TROPONINIHS 8 7  --   --   --   --   --   --   --   --   --    < > = values in this interval not displayed.     Estimated Creatinine Clearance: 79.5 mL/min (by C-G formula based on SCr of 0.78 mg/dL).   Medical History: Past Medical History:  Diagnosis Date   Abdominal aortic atherosclerosis ([place])    a. 05/2017 CTA abd/pelvis: significant atherosclerotic dzs of the infrarenal abd Ao w/ some mural thrombus. No aneurysm or dissection.   Acute focal ischemia of small intestine (HCC)    Acute right-sided low back pain with right-sided sciatica 06/13/2017   AKI (acute kidney injury) ([place]) 07/29/2017   Anemia    Anginal pain ([place])    a. 08/2012 Lexiscan MV: EF 54%, non ischemia/infarct.   Anxiety and depression    Baker's cyst of knee, right    a. 07/2016 U/S: 4.1 x 1.4 x 2.9 cystic structure in R poplitetal fossa.   Bell's palsy    CAD (coronary artery disease)    CHF (congestive heart failure) ([place])    a.) TTE 06/20/2017: EF 60-65%, no rwma, G1DD, no source of cardiac  emboli.   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 05/2017 CTA Abd/pelvis: apparent thrombus or embolus in prox SMA (70-90%); b. 05/2017 catheter directed tPA, mechanical thrombectomy, and stenting of the SMA.   Essential hypertension with goal blood pressure less than 140/90 01/19/2016  Gastroesophageal reflux disease 01/19/2016   GERD (gastroesophageal reflux disease)    H/O colectomy    History of 2019 novel coronavirus disease (COVID-19) 11/14/2019   History of kidney stones    Hyperlipidemia    Hypertension    Morbid obesity with BMI of 40.0-44.9, adult ([place])    NSTEMI (non-ST elevated myocardial infarction) ([place]) 06/27/2021   a.) high sensitivity troponins trended: 893 --> 1587 --> 1748 --> 868 --> 735 ng/L   Occlusive mesenteric ischemia ([place]) 06/19/2017   Osteoarthritis    PAD (peripheral artery disease) (HCC)    PAF (paroxysmal atrial fibrillation) (HCC)    a.) CHA2DS2-VASc = 6 (sex, CHF, HTN, DVT x2, aortic plaque). b.) rate/rhythm maintained on oral metoprolol tartrate; chronically anticoagulated with standard dose apixaban.   Palpitations    Peripheral edema    Personal history of other malignant neoplasm of skin 06/01/2016   Pneumonia    Pre-diabetes    Primary osteoarthritis of both knees 06/13/2017   SBO (small bowel obstruction) ([place]) 08/06/2017   Skin cancer of nose    Superior mesenteric artery thrombosis (HCC)    Vitamin D deficiency     Medications:  Apixaban 5 mg BID (last patient reported dose was 2/10 PM)  Assessment: Patient is a 62 y/o F with medical history as above and including Afib and Hx DVT on apixaban who is admitted with Rt AKA stump infection / OM. Patient may need surgical intervention. Pharmacy consulted to initiate and mange heparin  infusion while apixaban is on hold.   Baseline aPTT, PT-INR and, heparin level are pending. Baseline H&H, platelets within normal limits.  Goal of Therapy:  Heparin level 0.3-0.7 units/ml aPTT 66 - 102 seconds Monitor platelets by anticoagulation protocol: Yes   Plan:   2/13: aPTT @ 2121 = 70, therapeutic X 1 - Will continue pt on current rate and recheck HL and aPTT in 6 hrs on 2/14 @ 0300.  --Follow aPTT until correlation established with heparin level. Check aPTT/heparin level tomorrow AM --Daily CBC per protocol while on IV heparin  Carla [name] D 12/05/2022 9:53 PM

## 2022-12-05 NOTE — Consult Note (Signed)
ANTICOAGULATION CONSULT NOTE  Pharmacy Consult for IV Heparin Indication: atrial fibrillation and Hx DVT   Patient Measurements: Height: 5' 2"$  (157.5 cm) Weight: 99.8 kg (220 lb) IBW/kg (Calculated) : 50.1 Heparin Dosing Weight: 73.8 kg  Labs: Recent Labs    12/03/22 0359 12/03/22 0632 12/03/22 0832 12/03/22 0832 12/03/22 1715 12/04/22 0110 12/04/22 0451 12/04/22 0804 12/05/22 0451 12/05/22 1146  HGB 14.0  --   --   --   --   --  10.6*  --  10.3*  --   HCT 44.4  --   --   --   --   --  34.0*  --  33.9*  --   PLT 272  --   --   --   --   --  209  --  198  --   APTT  --   --  26   < > 53*   < >  --  98* 107* 109*  LABPROT  --   --  14.3  --   --   --  13.9  --   --   --   INR  --   --  1.1  --   --   --  1.1  --   --   --   HEPARINUNFRC  --   --  0.37   < > 0.64  --   --  0.95* 0.79*  --   CREATININE 0.80  --   --   --   --   --  0.88  --  0.78  --   TROPONINIHS 8 7  --   --   --   --   --   --   --   --    < > = values in this interval not displayed.     Estimated Creatinine Clearance: 79.5 mL/min (by C-G formula based on SCr of 0.78 mg/dL).   Medical History: Past Medical History:  Diagnosis Date   Abdominal aortic atherosclerosis ([place])    a. 05/2017 CTA abd/pelvis: significant atherosclerotic dzs of the infrarenal abd Ao w/ some mural thrombus. No aneurysm or dissection.   Acute focal ischemia of small intestine (HCC)    Acute right-sided low back pain with right-sided sciatica 06/13/2017   AKI (acute kidney injury) ([place]) 07/29/2017   Anemia    Anginal pain ([place])    a. 08/2012 Lexiscan MV: EF 54%, non ischemia/infarct.   Anxiety and depression    Baker's cyst of knee, right    a. 07/2016 U/S: 4.1 x 1.4 x 2.9 cystic structure in R poplitetal fossa.   Bell's palsy    CAD (coronary artery disease)    CHF (congestive heart failure) ([place])    a.) TTE 06/20/2017: EF 60-65%, no rwma, G1DD, no source of cardiac emboli.   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 05/2017 CTA Abd/pelvis: apparent thrombus or embolus in prox SMA (70-90%); b. 05/2017 catheter directed tPA, mechanical thrombectomy, and stenting of the SMA.   Essential hypertension with goal blood pressure less than 140/90 01/19/2016   Gastroesophageal reflux disease 01/19/2016   GERD (gastroesophageal reflux disease)    H/O colectomy    History of 2019 novel coronavirus disease (COVID-19) 11/14/2019  History of kidney stones    Hyperlipidemia    Hypertension    Morbid obesity with BMI of 40.0-44.9, adult (HCC)    NSTEMI (non-ST elevated myocardial infarction) ([place]) 06/27/2021   a.) high sensitivity troponins trended: 893 --> 1587 --> 1748 --> 868 --> 735 ng/L   Occlusive mesenteric ischemia (HCC) 06/19/2017   Osteoarthritis    PAD (peripheral artery disease) (HCC)    PAF (paroxysmal atrial fibrillation) (HCC)    a.) CHA2DS2-VASc = 6 (sex, CHF, HTN, DVT x2, aortic plaque). b.) rate/rhythm maintained on oral metoprolol tartrate; chronically anticoagulated with standard dose apixaban.   Palpitations    Peripheral edema    Personal history of other malignant neoplasm of skin 06/01/2016   Pneumonia    Pre-diabetes    Primary osteoarthritis of both knees 06/13/2017   SBO (small bowel obstruction) ([place]) 08/06/2017   Skin cancer of nose    Superior mesenteric artery thrombosis (HCC)    Vitamin D deficiency     Medications:  Apixaban 5 mg BID (last patient reported dose was 2/10 PM)  Assessment: Patient is a 62 y/o F with medical history as above and including Afib and Hx DVT on apixaban who is admitted with Rt AKA stump infection / OM. Patient may need surgical intervention. Pharmacy consulted to initiate and mange heparin infusion while apixaban is on hold.   Baseline aPTT, PT-INR  and, heparin level are pending. Baseline H&H, platelets within normal limits.  Goal of Therapy:  Heparin level 0.3-0.7 units/ml aPTT 66 - 102 seconds Monitor platelets by anticoagulation protocol: Yes   Plan:  aPTT remains elevated despite recent rate change ---decrease heparin drip to 1200 units/hr ---recheck aPTT 6 hrs after rate change  ---recheck HL on 2/14 with AM labs.  --Follow aPTT until correlation established with heparin level. Check aPTT/heparin level tomorrow AM --Daily CBC per protocol while on IV heparin  Dallie Piles 12/05/2022 1:13 PM

## 2022-12-06 DIAGNOSIS — M86151 Other acute osteomyelitis, right femur: Secondary | ICD-10-CM | POA: Diagnosis not present

## 2022-12-06 LAB — HEPARIN LEVEL (UNFRACTIONATED): Heparin Unfractionated: 0.41 IU/mL (ref 0.30–0.70)

## 2022-12-06 LAB — APTT
aPTT: 64 seconds — ABNORMAL HIGH (ref 24–36)
aPTT: 63 seconds — ABNORMAL HIGH (ref 24–36)
aPTT: 65 seconds — ABNORMAL HIGH (ref 24–36)

## 2022-12-06 LAB — TYPE AND SCREEN
ABO/RH(D): A POS
Antibody Screen: NEGATIVE

## 2022-12-06 LAB — CREATININE, SERUM
Creatinine, Ser: 0.8 mg/dL (ref 0.44–1.00)
GFR, Estimated: >60 mL/min (ref >60)

## 2022-12-06 MED ORDER — SODIUM CHLORIDE 0.9 % IV SOLN
1.0000 g | Freq: Three times a day (TID) | INTRAVENOUS | Status: AC
  Administered 2022-12-06 – 2022-12-11 (×15): 1 g via INTRAVENOUS
  Filled 2022-12-06 (×3): qty 20
  Filled 2022-12-06: qty 1
  Filled 2022-12-06: qty 20
  Filled 2022-12-06 (×3): qty 1
  Filled 2022-12-06 (×8): qty 20

## 2022-12-06 MED ORDER — ORAL CARE MOUTH RINSE: 15.0000 mL | OROMUCOSAL | Status: DC | PRN

## 2022-12-06 MED ORDER — FAMOTIDINE 20 MG PO TABS: 40.0000 mg | ORAL_TABLET | Freq: Once | ORAL | Status: DC | PRN

## 2022-12-06 MED ORDER — SODIUM CHLORIDE 0.9 % IV BOLUS
500.0000 mL | Freq: Once | INTRAVENOUS | Status: AC
  Administered 2022-12-06: 500 mL via INTRAVENOUS

## 2022-12-06 MED ORDER — CHLORHEXIDINE GLUCONATE CLOTH 2 % EX PADS
6.0000 | MEDICATED_PAD | Freq: Once | CUTANEOUS | Status: AC
  Administered 2022-12-07: 6 via TOPICAL

## 2022-12-06 MED ORDER — DIPHENHYDRAMINE HCL 50 MG/ML IJ SOLN: 50.0000 mg | Freq: Once | INTRAMUSCULAR | Status: DC | PRN

## 2022-12-06 MED ORDER — ALPRAZOLAM 0.5 MG PO TABS
0.5000 mg | ORAL_TABLET | Freq: Two times a day (BID) | ORAL | Status: DC | PRN
  Administered 2022-12-06 – 2022-12-13 (×7): 0.5 mg via ORAL
  Filled 2022-12-06 (×7): qty 1

## 2022-12-06 MED ORDER — METHYLPREDNISOLONE SODIUM SUCC 125 MG IJ SOLR: 125.0000 mg | Freq: Once | INTRAMUSCULAR | Status: DC | PRN

## 2022-12-06 MED ORDER — MIDAZOLAM HCL 2 MG/ML PO SYRP: 8.0000 mg | ORAL_SOLUTION | Freq: Once | ORAL | Status: DC | PRN

## 2022-12-06 MED ORDER — CEFAZOLIN SODIUM-DEXTROSE 2-4 GM/100ML-% IV SOLN
2.0000 g | INTRAVENOUS | Status: AC
  Administered 2022-12-07: 2 g via INTRAVENOUS
  Filled 2022-12-06: qty 100

## 2022-12-06 MED ORDER — SODIUM CHLORIDE 0.9 % IV SOLN: INTRAVENOUS | Status: DC

## 2022-12-06 MED ORDER — HEPARIN BOLUS VIA INFUSION
1100.0000 [IU] | Freq: Once | INTRAVENOUS | Status: AC
  Administered 2022-12-06: 1100 [IU] via INTRAVENOUS
  Filled 2022-12-06: qty 1100

## 2022-12-06 NOTE — TOC Initial Note (Signed)
Transition of Care [facility]) - Initial/Assessment Note    Patient Details  Name: Carla [name] MRN: [id number] Date of Birth: 30-Apr-1961  [facility]) CM/SW Contact:    Beverly Sessions, RN Phone Number: 12/06/2022, 1:32 PM  Clinical Narrative:                 Admitted for: plan for perating room on Thursday 12/07/22 for a Right AKA revision and Incision and drainage of an infection  Admitted from: Home with daughter and son in law PCP: Patient states she now has a PCP in [place] but can not recall their name Current home health/prior home health/DME: Hospital bed, 40M wound vac, WC, BSC  Patient states her plan is to return home with resumption of [facility] home [facility]. Patient is active with RN.   Patient states son in law will transport at discharge          Patient Goals and CMS Choice            Expected Discharge Plan and Services                                              Prior Living Arrangements/Services                       Activities of Daily Living Home Assistive Devices/Equipment: Wheelchair, Hospital bed ADL Screening (condition at time of admission) Patient's cognitive ability adequate to safely complete daily activities?: Yes Is the patient deaf or have difficulty hearing?: No Does the patient have difficulty seeing, even when wearing glasses/contacts?: No Does the patient have difficulty concentrating, remembering, or making decisions?: No Patient able to express need for assistance with ADLs?: Yes Does the patient have difficulty dressing or bathing?: Yes Independently performs ADLs?: No Communication: Independent Dressing (OT): Needs assistance Is this a change from baseline?: Pre-admission baseline Grooming: Needs assistance Is this a change from baseline?: Pre-admission baseline Feeding: Independent Bathing: Dependent Is this a change from baseline?: Pre-admission baseline Toileting: Dependent Is this a  change from baseline?: Pre-admission baseline In/Out Bed: Dependent Is this a change from baseline?: Pre-admission baseline Walks in Home: Dependent Is this a change from baseline?: Pre-admission baseline Does the patient have difficulty walking or climbing stairs?: Yes Weakness of Legs: Both Weakness of Arms/Hands: Both  Permission Sought/Granted                  Emotional Assessment              Admission diagnosis:  Acute osteomyelitis of right thigh ([place]) [M86.151] Other osteomyelitis of right femur ([place]) [M86.8X5] Patient Active Problem List   Diagnosis Date Noted   Acute osteomyelitis of right thigh ([place]) 12/03/2022   Neuropathy 12/03/2022   Diarrhea 12/03/2022   Leg abscess 08/31/2022   Anxiety [id number]   Chronic systolic CHF (congestive heart failure) ([place]) 08/30/2022   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 ([place]) 08/29/2022   Pressure injury of skin 08/28/2022   Right leg pain 08/26/2022   Hyperlipidemia, unspecified 08/26/2022   Iron deficiency anemia 08/26/2022   Acute blood loss anemia 08/09/2022   Hematoma 07/26/2022   Hematoma of right thigh 07/24/2022   Acute respiratory failure with hypoxia ([place]) 05/02/2022   Lactic acidosis 05/02/2022   Hypoalbuminemia due to protein-calorie malnutrition ([place]) 05/02/2022   Atherosclerosis  of artery of extremity with rest pain ([place]) 04/06/2022   Rectal bleeding 03/21/2022   Hypokalemia 02/03/2022   Chronic anticoagulation    Anxiety and depression    Palpitations    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[place]) 12/03/2021   Critical limb ischemia of left lower extremity ([place]) 11/30/2021   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   Arterial occlusion 11/11/2021   CAD (coronary artery disease) 07/07/2021   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[place]) 06/27/2021   Ischemic pain of right foot 06/27/2021   PVD (peripheral vascular disease) ([place]) 05/03/2021   Tobacco abuse counseling 10/26/2020   Smoker  10/26/2020   Chronic diastolic CHF (congestive heart failure) ([place]) 10/06/2020   Chronic pain 12/01/2019   Primary osteoarthritis involving multiple joints 12/01/2019   Carpal tunnel syndrome, right 11/10/2019   Health care maintenance 03/26/2019   Disorder of skeletal system 03/26/2019   Problems influencing health status 03/26/2019   Chronic low back pain (Secondary area of Pain) (Bilateral) w/o sciatica 03/26/2019   Aortic atherosclerosis ([place]) 09/12/2018   COPD (chronic obstructive pulmonary disease) ([place]) 09/12/2018   Moderate episode of recurrent major depressive disorder ([place]) 09/12/2018   Lumbar spondylosis 09/06/2018   Ventral hernia with obstruction and without gangrene 07/02/2018   Paroxysmal atrial fibrillation ([place]) 06/15/2018   Acute encephalopathy 04/08/2018   DDD (degenerative disc disease), lumbosacral 09/25/2017   Osteoarthritis of knee 09/25/2017   Adjustment disorder with anxiety 08/15/2017   Protein-calorie malnutrition, severe 08/08/2017   Chronic mesenteric ischemia ([place])    AKI (acute kidney injury) ([place]) 07/29/2017   Hypotension 07/29/2017   Embolus of superior mesenteric artery ([place])    Abdominal pain    Occlusive mesenteric ischemia ([place]) 06/19/2017   Acute focal ischemia of small intestine (HCC)    Acute right-sided low back pain with right-sided sciatica 06/13/2017   Osteoarthritis of knee (Bilateral) 06/13/2017   Chronic pain syndrome 09/13/2016   Obesity, Class III, BMI 40-49.9 (morbid obesity) ([place]) 07/16/2016   Personal history of other malignant neoplasm of skin 06/01/2016   Chronic knee pain (Primary Area of Pain) (Bilateral) 01/19/2016   Essential hypertension 01/19/2016   GERD (gastroesophageal reflux disease) 01/19/2016   Mixed hyperlipidemia 01/19/2016   Prediabetes 01/19/2016   Recurrent major depressive disorder ([place]) 01/19/2016   Chronic pelvic pain in female 11/14/2013   Mixed stress and urge urinary incontinence 11/14/2013   Chest  pain 08/06/2012   PCP:  [facility] [place]:   [facility] [address]), [place] ([address]) [place] [address] Phone: [phone] Fax: [phone]     Social Determinants of Health (SDOH) Social History: SDOH Screenings   Food Insecurity: No Food Insecurity (12/04/2022)  Housing: Low Risk  (12/04/2022)  Transportation Needs: No Transportation Needs (12/04/2022)  Utilities: Not At Risk (12/04/2022)  Depression (PHQ2-9): Low Risk  (02/02/2021)  Tobacco Use: Medium Risk (12/04/2022)   SDOH Interventions:     Readmission Risk Interventions    12/06/2022    1:31 PM 07/25/2022   10:49 AM 05/05/2022   10:52 AM  Readmission Risk Prevention Plan  Transportation Screening Complete Complete Complete  Medication Review ([place]) Complete Complete Complete  PCP or Specialist appointment within 3-5 days of discharge  Complete Complete  HRI or Home Care Consult  Complete   SW Recovery Care/Counseling Consult Complete Not Complete Complete  SW Consult Not Complete Comments  NA   Palliative Care Screening Not Applicable  Not [place]  Patient  Refused Patient Refused

## 2022-12-06 NOTE — H&P (View-Only) (Signed)
Progress Note    12/06/2022 9:55 AM * No surgery date entered *  Subjective:  This 62 y.o. female with medical history significant of CHF , A-fib , peripheral vascular disease , COPD,  hypertension,  hyperlipidemia,  arthritis,  scoliosis and depression.  She complains of leg pain, heart fluttering,  dizziness and shortness of breath.  For the past 2-1/2 months she is having open wound on her right leg. CT scan of the femur shows stump osteomyelitis, complex fluid collection measuring 7 x 6 x 7.4 cm, tract from the collection to the open wound.    On exam this morning patient is resting comfortably in bed.  She states she still has some considerable pain in her right leg.  No other complaints overnight vitals all remained stable.  Patient was instructed she will be n.p.o. after midnight and the plan is to continue with going to the operating room tomorrow for incisional drainage of her right leg.   Vitals:   12/06/22 0826 12/06/22 0839  BP: (!) 86/53 (!) 94/50  Pulse: 78 75  Resp: 16   Temp: 98.6 F (37 C)   SpO2: 96%    Physical Exam: Cardiac:  RRR Lungs: Clear to auscultation.  Respiratory effort normal.  Respiratory rate 20 Incisions: Right AKA stump incision well-healed. Extremities: Right AKA with ulceration wound to the stump.  (See media pictures) Abdomen: Bowel sounds, soft, nontender abdomen. Neurologic: A&OX3 neuro intact.  Answers all questions appropriately.  CBC    Component Value Date/Time   WBC 6.4 12/05/2022 0451   RBC 3.93 12/05/2022 0451   HGB 10.3 (L) 12/05/2022 0451   HGB 13.4 02/12/2015 1137   HCT 33.9 (L) 12/05/2022 0451   HCT 40.9 02/12/2015 1137   PLT 198 12/05/2022 0451   PLT 272 02/12/2015 1137   MCV 86.3 12/05/2022 0451   MCV 90 02/12/2015 1137   MCH 26.2 12/05/2022 0451   MCHC 30.4 12/05/2022 0451   RDW 17.3 (H) 12/05/2022 0451   RDW 14.8 (H) 02/12/2015 1137   LYMPHSABS 2.7 12/03/2022 0359   LYMPHSABS 1.7 08/27/2014 1145   MONOABS 0.8  12/03/2022 0359   MONOABS 0.4 08/27/2014 1145   EOSABS 0.1 12/03/2022 0359   EOSABS 0.0 08/27/2014 1145   BASOSABS 0.1 12/03/2022 0359   BASOSABS 0.0 08/27/2014 1145    BMET    Component Value Date/Time   NA 139 12/05/2022 0451   NA 140 02/12/2015 1137   K 3.9 12/05/2022 0451   K 2.9 (L) 02/12/2015 1137   CL 112 (H) 12/05/2022 0451   CL 107 02/12/2015 1137   CO2 20 (L) 12/05/2022 0451   CO2 27 02/12/2015 1137   GLUCOSE 111 (H) 12/05/2022 0451   GLUCOSE 120 (H) 02/12/2015 1137   BUN 10 12/05/2022 0451   BUN 15 02/12/2015 1137   CREATININE 0.80 12/06/2022 0309   CREATININE 0.78 02/12/2015 1137   CALCIUM 8.0 (L) 12/05/2022 0451   CALCIUM 9.0 02/12/2015 1137   GFRNONAA >60 12/06/2022 0309   GFRNONAA >60 02/12/2015 1137   GFRAA >60 11/23/2019 1330   GFRAA >60 02/12/2015 1137    INR    Component Value Date/Time   INR 1.1 12/04/2022 0451   INR 0.8 07/23/2012 0433     Intake/Output Summary (Last 24 hours) at 12/06/2022 0955 Last data filed at 12/06/2022 0424 Gross per 24 hour  Intake 724.52 ml  Output 2550 ml  Net -1825.48 ml  EXAM: CT OF THE LOWER RIGHT EXTREMITY WITH CONTRAST  IMPRESSION: 1. Increasingly eroded appearance of the distal 5 cm of the femoral shaft to the resection margin, with increased raised periosteal reaction posteriorly, consistent with stump osteomyelitis. 2. 7 x 6 x 7.4 cm complex collection of mixed attenuation and a thick wall continues to surround the distal femoral stump. A portion of this appears to extend into the intramedullary space of the stump. 3. There is new demonstration of a tract from the collection to an open wound on the anterior skin surface of the distal thigh. No soft tissue gas is seen in the collection or in the subcutaneous tissues but an infectious collection is considered present until proven otherwise. 4. Increasing prominence of right inguinal chain nodes some of which measure up to 1 cm in short axis. 5. Large  ventral hernia again sags to the right with multiple small and large bowel loops within both with no dilated bowel in the hernia sac or visualized abdomen.   Assessment/Plan:  62 y.o. female is s/p 62 y.o. female who has a history of Rt AKA. She returns to the ED yesterday with chest pain and SOB. She was also noted to have an ulcer/wound on the stump of her right aka. (See media pictures) CT scan was done showing a possible abscess and osteomyelitis.  * No surgery date entered *   PLAN: Patient is scheduled to go to the operating room on Thursday 12/07/22 for a Right AKA revision and Incision and drainage of an infection.  Patient was made n.p.o. after midnight.  DVT prophylaxis: Heparin infusion.   Drema Pry [facility] 12/06/2022 9:55 AM

## 2022-12-06 NOTE — Consult Note (Signed)
ANTICOAGULATION CONSULT NOTE  Pharmacy Consult for IV Heparin Indication: atrial fibrillation and Hx DVT   Patient Measurements: Height: 5' 2"$  (157.5 cm) Weight: 99.8 kg (220 lb) IBW/kg (Calculated) : 50.1 Heparin Dosing Weight: 73.8 kg  Labs: Recent Labs    12/03/22 0832 12/03/22 1715 12/04/22 0451 12/04/22 0804 12/05/22 0451 12/05/22 1146 12/05/22 2121 12/06/22 0309  HGB  --   --  10.6*  --  10.3*  --   --   --   HCT  --   --  34.0*  --  33.9*  --   --   --   PLT  --   --  209  --  198  --   --   --   APTT 26   < >  --  98* 107* 109* 70* 64*  LABPROT 14.3  --  13.9  --   --   --   --   --   INR 1.1  --  1.1  --   --   --   --   --   HEPARINUNFRC 0.37   < >  --  0.95* 0.79*  --   --  0.41  CREATININE  --   --  0.88  --  0.78  --   --  0.80   < > = values in this interval not displayed.     Estimated Creatinine Clearance: 79.5 mL/min (by C-G formula based on SCr of 0.8 mg/dL).   Medical History: Past Medical History:  Diagnosis Date   Abdominal aortic atherosclerosis ([place])    a. 05/2017 CTA abd/pelvis: significant atherosclerotic dzs of the infrarenal abd Ao w/ some mural thrombus. No aneurysm or dissection.   Acute focal ischemia of small intestine (HCC)    Acute right-sided low back pain with right-sided sciatica 06/13/2017   AKI (acute kidney injury) ([place]) 07/29/2017   Anemia    Anginal pain ([place])    a. 08/2012 Lexiscan MV: EF 54%, non ischemia/infarct.   Anxiety and depression    Baker's cyst of knee, right    a. 07/2016 U/S: 4.1 x 1.4 x 2.9 cystic structure in R poplitetal fossa.   Bell's palsy    CAD (coronary artery disease)    CHF (congestive heart failure) ([place])    a.) TTE 06/20/2017: EF 60-65%, no rwma, G1DD, no source of cardiac emboli.   Chronic anticoagulation    a.) apixaban   Chronic idiopathic constipation    Chronic mesenteric ischemia (HCC)    Chronic pain    COPD (chronic obstructive pulmonary disease) (HCC)    Dyspnea    Embolus of  superior mesenteric artery ([place])    a. 05/2017 CTA Abd/pelvis: apparent thrombus or embolus in prox SMA (70-90%); b. 05/2017 catheter directed tPA, mechanical thrombectomy, and stenting of the SMA.   Essential hypertension with goal blood pressure less than 140/90 01/19/2016   Gastroesophageal reflux disease 01/19/2016   GERD (gastroesophageal reflux disease)    H/O colectomy    History of 2019 novel coronavirus disease (COVID-19) 11/14/2019   History of kidney stones    Hyperlipidemia    Hypertension    Morbid obesity with BMI of 40.0-44.9, adult ([place])    NSTEMI (non-ST elevated myocardial infarction) ([place]) 06/27/2021   a.) high sensitivity troponins trended: 893 --> 1587 --> 1748 --> 868 --> 735 ng/L   Occlusive mesenteric ischemia ([place]) 06/19/2017   Osteoarthritis    PAD (peripheral artery disease) (HCC)    PAF (paroxysmal atrial fibrillation) (  [place])    a.) CHA2DS2-VASc = 6 (sex, CHF, HTN, DVT x2, aortic plaque). b.) rate/rhythm maintained on oral metoprolol tartrate; chronically anticoagulated with standard dose apixaban.   Palpitations    Peripheral edema    Personal history of other malignant neoplasm of skin 06/01/2016   Pneumonia    Pre-diabetes    Primary osteoarthritis of both knees 06/13/2017   SBO (small bowel obstruction) ([place]) 08/06/2017   Skin cancer of nose    Superior mesenteric artery thrombosis (HCC)    Vitamin D deficiency     Medications:  Apixaban 5 mg BID (last patient reported dose was 2/10 PM)  Assessment: Patient is a 62 y/o F with medical history as above and including Afib and Hx DVT on apixaban who is admitted with Rt AKA stump infection / OM. Patient may need surgical intervention. Pharmacy consulted to initiate and mange heparin infusion while apixaban is on hold.   Goal of Therapy:  Heparin level 0.3-0.7 units/ml aPTT 66 - 102 seconds Monitor platelets by anticoagulation protocol: Yes   Plan:  aPTT slightly subtherapeutic following previous rate  adjustment ---increase heparin infusion rate to 1400 units/hr.  - Will recheck aPTT 6 hrs after rate change.  - Will recheck HL on 2/15 with AM labs.  --Follow aPTT until correlation established with heparin level. Check aPTT/heparin level tomorrow AM --Daily CBC per protocol while on IV heparin  Dallie Piles 12/06/2022 7:18 AM

## 2022-12-06 NOTE — Progress Notes (Signed)
PROGRESS NOTE    Carla [name]  [id number] DOB: 1961/07/03 DOA: 12/03/2022 PCP: [facility] [place]   Brief Narrative:  This 62 y.o. female with medical history significant of CHF , A-fib , peripheral vascular disease , COPD,  hypertension,  hyperlipidemia,  arthritis,  scoliosis and depression.  She complains of leg pain, heart fluttering,  dizziness and shortness of breath.  For the past 2-1/2 months she is having open wound on her right leg.  Her last dose of Eliquis was last night.  Also been very fatigued and sweating.  Has not noticed a fever.  CT scan of the femur shows stump osteomyelitis, complex fluid collection measuring 7 x 6 x 7.4 cm, tract from the collection to the open wound.  Patient was started on vancomycin and Zosyn and hospitalist services contacted for further evaluation.  Vascular surgery was consulted.  Patient will need repeat incision and drainage and may potentially need a more proximal revision of her amputation, tentatively scheduled for Thursday.  Assessment & Plan:   Principal Problem:   Acute osteomyelitis of right thigh (HCC) Active Problems:   Hypokalemia   PVD (peripheral vascular disease) (HCC)   Paroxysmal atrial fibrillation (HCC)   Hyperlipidemia, unspecified   Chronic pain   Obesity, Class III, BMI 40-49.9 (morbid obesity) ([place])   Essential hypertension   GERD (gastroesophageal reflux disease)   Neuropathy   Diarrhea  Acute osteomyelitis of right thigh: She presented with stump infection. CT A/P: Showed fluid collection around the graft slightly increased in the interval. 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  Likely will need an AKA revision.  Case discussed with vascular surgery.  Eliquis was discontinued.  Started on heparin drip.  Continue empiric antibiotic vancomycin and Zosyn.  Follow-up wound care. Vascular surgery consulted, Patient  require repeat incision and drainage, may  potentially need more proximal revision of her amputation tentatively scheduled for Thursday..   Hypokalemia: Replaced and resolved.   Peripheral vascular disease: Patient has a predisposition for developing blood clots,  will bridge with heparin drip at this point.   Paroxysmal atrial fibrillation: Eliquis kept on hold. Started on heparin for bridging.  Continue  metoprolol for rate control.   Hyperlipidemia, unspecified Continue statin.   Chronic pain: Continue her pain regimen.   Obesity, Class III, BMI 40-49.9 (morbid obesity) (HCC) BMI 40.24 with current height and weight in computer   Diarrhea: Improved No stool studies.  Hold MiraLAX.   Neuropathy: Continue Lyrica 200 mg three times daily.   GERD (gastroesophageal reflux disease) Continue pantoprazole 40 mg daily.   Essential hypertension Continue metoprolol 12.5 mg q12hr.  Anxiety: Start Xanax 0.5 mg twice daily as needed for anxiety.  DVT prophylaxis: Heparin IV Code Status: Full code. Family Communication: No family at bed side. Disposition Plan:   Status is: Inpatient Remains inpatient appropriate because: Admitted for acute osteomyelitis of the stump.  Vascular surgery consulted patient may require incision and drainage or revision of the stump, tentatively scheduled for Thursday   Consultants:  Vascular surgery  Procedures: Antimicrobials:  Anti-infectives (From admission, onward)    Start     Dose/Rate Route Frequency Ordered Stop   12/04/22 1000  vancomycin (VANCOREADY) IVPB 1250 mg/250 mL        1,250 mg 166.7 mL/hr over 90 Minutes Intravenous Every 24 hours 12/03/22 0802     12/03/22 1400  piperacillin-tazobactam (ZOSYN) IVPB 3.375 g  3.375 g 12.5 mL/hr over 240 Minutes Intravenous Every 8 hours 12/03/22 0801     12/03/22 0630  piperacillin-tazobactam (ZOSYN) IVPB 3.375 g        3.375 g 100 mL/hr over 30 Minutes Intravenous  Once 12/03/22 0623 12/03/22 0715   12/03/22 0630   vancomycin (VANCOREADY) IVPB 2000 mg/400 mL        2,000 mg 200 mL/hr over 120 Minutes Intravenous  Once 12/03/22 [id number] 12/03/22 0916      Subjective: Patient was seen and examined at bedside.  Overnight events noted.   Patient appears anxious for upcoming procedure. She still reports having significant pain in right stump. Patient is tentatively scheduled for  Right AKA revision tomorrow  Objective: Vitals:   12/05/22 2113 12/06/22 0826 12/06/22 0839 12/06/22 1043  BP: 104/66 (!) 86/53 (!) 94/50 103/69  Pulse: 89 78 75   Resp: 20 16    Temp: 98.1 F (36.7 C) 98.6 F (37 C)    TempSrc: Oral Oral    SpO2: 95% 96%    Weight:      Height:        Intake/Output Summary (Last 24 hours) at 12/06/2022 1142 Last data filed at 12/06/2022 0424 Gross per 24 hour  Intake 724.52 ml  Output 2550 ml  Net -1825.48 ml   Filed Weights   12/03/22 0319  Weight: 99.8 kg    Examination:  General exam: Appears comfortable, deconditioned, not in any distress. Respiratory system: Clear to auscultation. Respiratory effort normal.  RR 16 Cardiovascular system: S1 & S2 heard, regular rate and rhythm, no murmur. Gastrointestinal system: Abdomen is soft, non tender, non distended, BS+ Central nervous system: Alert and oriented x 3 . No focal neurological deficits. Extremities: Right stump area mildly erythematous with dressing noted. Skin: No rashes, lesions or ulcers Psychiatry: Judgement and insight appear normal. Mood & affect appropriate.     Data Reviewed: I have personally reviewed following labs and imaging studies  CBC: Recent Labs  Lab 12/03/22 0359 12/04/22 0451 12/05/22 0451  WBC 10.8* 6.6 6.4  NEUTROABS 7.2  --   --   HGB 14.0 10.6* 10.3*  HCT 44.4 34.0* 33.9*  MCV 83.8 86.3 86.3  PLT 272 209 [id number]   Basic Metabolic Panel: Recent Labs  Lab 12/03/22 0358 12/03/22 0359 12/04/22 0451 12/05/22 0451 12/06/22 0309  NA  --  139 138 139  --   K  --  3.0* 3.3* 3.9  --   CL   --  107 108 112*  --   CO2  --  21* 21* 20*  --   GLUCOSE  --  125* 123* 111*  --   BUN  --  14 15 10  $ --   CREATININE  --  0.80 0.88 0.78 0.80  CALCIUM  --  9.4 7.9* 8.0*  --   MG 2.2  --   --  1.9  --   PHOS  --   --   --  4.1  --    GFR: Estimated Creatinine Clearance: 79.5 mL/min (by C-G formula based on SCr of 0.8 mg/dL). Liver Function Tests: Recent Labs  Lab 12/03/22 0359  AST 23  ALT 15  ALKPHOS 121  BILITOT 1.0  PROT 7.9  ALBUMIN 3.7   No results for input(s): "LIPASE", "AMYLASE" in the last 168 hours. No results for input(s): "AMMONIA" in the last 168 hours. Coagulation Profile: Recent Labs  Lab 12/03/22 0832 12/04/22 0451  INR 1.1 1.1  Cardiac Enzymes: No results for input(s): "CKTOTAL", "CKMB", "CKMBINDEX", "TROPONINI" in the last 168 hours. BNP (last 3 results) No results for input(s): "PROBNP" in the last 8760 hours. HbA1C: No results for input(s): "HGBA1C" in the last 72 hours. CBG: No results for input(s): "GLUCAP" in the last 168 hours. Lipid Profile: No results for input(s): "CHOL", "HDL", "LDLCALC", "TRIG", "CHOLHDL", "LDLDIRECT" in the last 72 hours. Thyroid Function Tests: No results for input(s): "TSH", "T4TOTAL", "FREET4", "T3FREE", "THYROIDAB" in the last 72 hours. Anemia Panel: No results for input(s): "VITAMINB12", "FOLATE", "FERRITIN", "TIBC", "IRON", "RETICCTPCT" in the last 72 hours. Sepsis Labs: Recent Labs  Lab 12/03/22 0358  PROCALCITON <0.10    Recent Results (from the past 240 hour(s))  Resp panel by RT-PCR (RSV, Flu A&B, Covid) Anterior Nasal Swab     Status: None   Collection Time: 12/03/22  3:59 AM   Specimen: Anterior Nasal Swab  Result Value Ref Range Status   SARS Coronavirus 2 by RT PCR NEGATIVE NEGATIVE Final    Comment: (NOTE) SARS-CoV-2 target nucleic acids are NOT DETECTED.  The SARS-CoV-2 RNA is generally detectable in upper respiratory specimens during the acute phase of infection. The lowest concentration  of SARS-CoV-2 viral copies this assay can detect is 138 copies/mL. A negative result does not preclude SARS-Cov-2 infection and should not be used as the sole basis for treatment or other patient management decisions. A negative result may occur with  improper specimen collection/handling, submission of specimen other than nasopharyngeal swab, presence of viral mutation(s) within the areas targeted by this assay, and inadequate number of viral copies(<138 copies/mL). A negative result must be combined with clinical observations, patient history, and epidemiological information. The expected result is Negative.  Fact Sheet for Patients:  EntrepreneurPulse.com.au  Fact Sheet for Healthcare Providers:  IncredibleEmployment.be  This test is no t yet approved or cleared by the [place] FDA and  has been authorized for detection and/or diagnosis of SARS-CoV-2 by FDA under an Emergency Use Authorization (EUA). This EUA will remain  in effect (meaning this test can be used) for the duration of the COVID-19 declaration under Section 564(b)(1) of the Act, 21 U.S.C.section 360bbb-3(b)(1), unless the authorization is terminated  or revoked sooner.       Influenza A by PCR NEGATIVE NEGATIVE Final   Influenza B by PCR NEGATIVE NEGATIVE Final    Comment: (NOTE) The Xpert Xpress SARS-CoV-2/FLU/RSV plus assay is intended as an aid in the diagnosis of influenza from Nasopharyngeal swab specimens and should not be used as a sole basis for treatment. Nasal washings and aspirates are unacceptable for Xpert Xpress SARS-CoV-2/FLU/RSV testing.  Fact Sheet for Patients: EntrepreneurPulse.com.au  Fact Sheet for Healthcare Providers: IncredibleEmployment.be  This test is not yet approved or cleared by the [place] FDA and has been authorized for detection and/or diagnosis of SARS-CoV-2 by FDA under an Emergency Use  Authorization (EUA). This EUA will remain in effect (meaning this test can be used) for the duration of the COVID-19 declaration under Section 564(b)(1) of the Act, 21 U.S.C. section 360bbb-3(b)(1), unless the authorization is terminated or revoked.     Resp Syncytial Virus by PCR NEGATIVE NEGATIVE Final    Comment: (NOTE) Fact Sheet for Patients: EntrepreneurPulse.com.au  Fact Sheet for Healthcare Providers: IncredibleEmployment.be  This test is not yet approved or cleared by the [place] FDA and has been authorized for detection and/or diagnosis of SARS-CoV-2 by FDA under an Emergency Use Authorization (EUA). This EUA will remain in effect (meaning  this test can be used) for the duration of the COVID-19 declaration under Section 564(b)(1) of the Act, 21 U.S.C. section 360bbb-3(b)(1), unless the authorization is terminated or revoked.  Performed at [facility], [address] [place], [place] [address]   Aerobic/Anaerobic Culture w Gram Stain (surgical/deep wound)     Status: None (Preliminary result)   Collection Time: 12/03/22  9:56 AM   Specimen: Abscess; Wound  Result Value Ref Range Status   Specimen Description   Final    ABSCESS Performed at [facility], [address], [place], [place] [address]    Special Requests   Final    Normal Performed at [facility], [address] [place], [place] [address]    Gram Stain   Final    MODERATE WBC PRESENT, PREDOMINANTLY PMN NO ORGANISMS SEEN Performed at [facility], [place] [address], [place], [place] [address]    Culture   Final    RARE ESCHERICHIA COLI Confirmed Extended Spectrum Beta-Lactamase Producer (ESBL).  In bloodstream infections from ESBL organisms, carbapenems are preferred over piperacillin/tazobactam. They are shown to have a lower risk of mortality. RARE ENTEROCOCCUS FAECALIS NO ANAEROBES ISOLATED; CULTURE IN PROGRESS FOR 5  DAYS    Report Status PENDING  Incomplete   Organism ID, Bacteria ESCHERICHIA COLI  Final   Organism ID, Bacteria ENTEROCOCCUS FAECALIS  Final      Susceptibility   Escherichia coli - MIC*    AMPICILLIN >=32 RESISTANT Resistant     CEFEPIME >=32 RESISTANT Resistant     CEFTAZIDIME RESISTANT Resistant     CEFTRIAXONE >=64 RESISTANT Resistant     CIPROFLOXACIN >=4 RESISTANT Resistant     GENTAMICIN <=1 SENSITIVE Sensitive     IMIPENEM <=0.25 SENSITIVE Sensitive     TRIMETH/SULFA >=320 RESISTANT Resistant     AMPICILLIN/SULBACTAM 16 INTERMEDIATE Intermediate     PIP/TAZO <=4 SENSITIVE Sensitive     * RARE ESCHERICHIA COLI   Enterococcus faecalis - MIC*    AMPICILLIN <=2 SENSITIVE Sensitive     VANCOMYCIN 1 SENSITIVE Sensitive     GENTAMICIN SYNERGY SENSITIVE Sensitive     * RARE ENTEROCOCCUS FAECALIS    Radiology Studies: No results found.  Scheduled Meds:  atorvastatin  80 mg Oral QHS   ferrous sulfate  325 mg Oral Q breakfast   isosorbide mononitrate  60 mg Oral Daily   lidocaine  1 patch Transdermal Q24H   metoprolol tartrate  12.5 mg Oral BID   morphine  15 mg Oral Q12H   multivitamin with minerals  1 tablet Oral Daily   pantoprazole  40 mg Oral Daily   pregabalin  200 mg Oral TID   Continuous Infusions:  heparin 1,350 Units/hr (12/06/22 0642)   piperacillin-tazobactam (ZOSYN)  IV 3.375 g (12/06/22 0559)   vancomycin 1,250 mg (12/06/22 1027)     LOS: 3 days    Time spent: 35 mins    Narcissa Melder, MD [facility]   If 7PM-7AM, please contact night-coverage

## 2022-12-06 NOTE — Progress Notes (Signed)
Progress Note    12/06/2022 9:55 AM * No surgery date entered *  Subjective:  This 62 y.o. female with medical history significant of CHF , A-fib , peripheral vascular disease , COPD,  hypertension,  hyperlipidemia,  arthritis,  scoliosis and depression.  She complains of leg pain, heart fluttering,  dizziness and shortness of breath.  For the past 2-1/2 months she is having open wound on her right leg. CT scan of the femur shows stump osteomyelitis, complex fluid collection measuring 7 x 6 x 7.4 cm, tract from the collection to the open wound.    On exam this morning patient is resting comfortably in bed.  She states she still has some considerable pain in her right leg.  No other complaints overnight vitals all remained stable.  Patient was instructed she will be n.p.o. after midnight and the plan is to continue with going to the operating room tomorrow for incisional drainage of her right leg.   Vitals:   12/06/22 0826 12/06/22 0839  BP: (!) 86/53 (!) 94/50  Pulse: 78 75  Resp: 16   Temp: 98.6 F (37 C)   SpO2: 96%    Physical Exam: Cardiac:  RRR Lungs: Clear to auscultation.  Respiratory effort normal.  Respiratory rate 20 Incisions: Right AKA stump incision well-healed. Extremities: Right AKA with ulceration wound to the stump.  (See media pictures) Abdomen: Bowel sounds, soft, nontender abdomen. Neurologic: A&OX3 neuro intact.  Answers all questions appropriately.  CBC    Component Value Date/Time   WBC 6.4 12/05/2022 0451   RBC 3.93 12/05/2022 0451   HGB 10.3 (L) 12/05/2022 0451   HGB 13.4 02/12/2015 1137   HCT 33.9 (L) 12/05/2022 0451   HCT 40.9 02/12/2015 1137   PLT 198 12/05/2022 0451   PLT 272 02/12/2015 1137   MCV 86.3 12/05/2022 0451   MCV 90 02/12/2015 1137   MCH 26.2 12/05/2022 0451   MCHC 30.4 12/05/2022 0451   RDW 17.3 (H) 12/05/2022 0451   RDW 14.8 (H) 02/12/2015 1137   LYMPHSABS 2.7 12/03/2022 0359   LYMPHSABS 1.7 08/27/2014 1145   MONOABS 0.8  12/03/2022 0359   MONOABS 0.4 08/27/2014 1145   EOSABS 0.1 12/03/2022 0359   EOSABS 0.0 08/27/2014 1145   BASOSABS 0.1 12/03/2022 0359   BASOSABS 0.0 08/27/2014 1145    BMET    Component Value Date/Time   NA 139 12/05/2022 0451   NA 140 02/12/2015 1137   K 3.9 12/05/2022 0451   K 2.9 (L) 02/12/2015 1137   CL 112 (H) 12/05/2022 0451   CL 107 02/12/2015 1137   CO2 20 (L) 12/05/2022 0451   CO2 27 02/12/2015 1137   GLUCOSE 111 (H) 12/05/2022 0451   GLUCOSE 120 (H) 02/12/2015 1137   BUN 10 12/05/2022 0451   BUN 15 02/12/2015 1137   CREATININE 0.80 12/06/2022 0309   CREATININE 0.78 02/12/2015 1137   CALCIUM 8.0 (L) 12/05/2022 0451   CALCIUM 9.0 02/12/2015 1137   GFRNONAA >60 12/06/2022 0309   GFRNONAA >60 02/12/2015 1137   GFRAA >60 11/23/2019 1330   GFRAA >60 02/12/2015 1137    INR    Component Value Date/Time   INR 1.1 12/04/2022 0451   INR 0.8 07/23/2012 0433     Intake/Output Summary (Last 24 hours) at 12/06/2022 0955 Last data filed at 12/06/2022 0424 Gross per 24 hour  Intake 724.52 ml  Output 2550 ml  Net -1825.48 ml  EXAM: CT OF THE LOWER RIGHT EXTREMITY WITH CONTRAST  IMPRESSION: 1. Increasingly eroded appearance of the distal 5 cm of the femoral shaft to the resection margin, with increased raised periosteal reaction posteriorly, consistent with stump osteomyelitis. 2. 7 x 6 x 7.4 cm complex collection of mixed attenuation and a thick wall continues to surround the distal femoral stump. A portion of this appears to extend into the intramedullary space of the stump. 3. There is new demonstration of a tract from the collection to an open wound on the anterior skin surface of the distal thigh. No soft tissue gas is seen in the collection or in the subcutaneous tissues but an infectious collection is considered present until proven otherwise. 4. Increasing prominence of right inguinal chain nodes some of which measure up to 1 cm in short axis. 5. Large  ventral hernia again sags to the right with multiple small and large bowel loops within both with no dilated bowel in the hernia sac or visualized abdomen.   Assessment/Plan:  62 y.o. female is s/p 62 y.o. female who has a history of Rt AKA. She returns to the ED yesterday with chest pain and SOB. She was also noted to have an ulcer/wound on the stump of her right aka. (See media pictures) CT scan was done showing a possible abscess and osteomyelitis.  * No surgery date entered *   PLAN: Patient is scheduled to go to the operating room on Thursday 12/07/22 for a Right AKA revision and Incision and drainage of an infection.  Patient was made n.p.o. after midnight.  DVT prophylaxis: Heparin infusion.   Drema Pry [facility] 12/06/2022 9:55 AM

## 2022-12-06 NOTE — Consult Note (Signed)
ANTICOAGULATION CONSULT NOTE  Pharmacy Consult for IV Heparin Indication: atrial fibrillation and Hx DVT   Patient Measurements: Height: 5' 2"$  (157.5 cm) Weight: 99.8 kg (220 lb) IBW/kg (Calculated) : 50.1 Heparin Dosing Weight: 73.8 kg  Labs: Recent Labs    12/03/22 0632 12/03/22 0832 12/03/22 1715 12/04/22 0451 12/04/22 0804 12/05/22 0451 12/05/22 1146 12/05/22 2121 12/06/22 0309  HGB  --   --   --  10.6*  --  10.3*  --   --   --   HCT  --   --   --  34.0*  --  33.9*  --   --   --   PLT  --   --   --  209  --  198  --   --   --   APTT  --  26   < >  --  98* 107* 109* 70* 64*  LABPROT  --  14.3  --  13.9  --   --   --   --   --   INR  --  1.1  --  1.1  --   --   --   --   --   HEPARINUNFRC  --  0.37   < >  --  0.95* 0.79*  --   --  0.41  CREATININE  --   --   --  0.88  --  0.78  --   --  0.80  TROPONINIHS 7  --   --   --   --   --   --   --   --    < > = values in this interval not displayed.     Estimated Creatinine Clearance: 79.5 mL/min (by C-G formula based on SCr of 0.8 mg/dL).   Medical History: Past Medical History:  Diagnosis Date   Abdominal aortic atherosclerosis ([place])    a. 05/2017 CTA abd/pelvis: significant atherosclerotic dzs of the infrarenal abd Ao w/ some mural thrombus. No aneurysm or dissection.   Acute focal ischemia of small intestine (HCC)    Acute right-sided low back pain with right-sided sciatica 06/13/2017   AKI (acute kidney injury) ([place]) 07/29/2017   Anemia    Anginal pain ([place])    a. 08/2012 Lexiscan MV: EF 54%, non ischemia/infarct.   Anxiety and depression    Baker's cyst of knee, right    a. 07/2016 U/S: 4.1 x 1.4 x 2.9 cystic structure in R poplitetal fossa.   Bell's palsy    CAD (coronary artery disease)    CHF (congestive heart failure) ([place])    a.) TTE 06/20/2017: EF 60-65%, no rwma, G1DD, no source of cardiac emboli.   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 05/2017 CTA Abd/pelvis: apparent thrombus or embolus in prox SMA (70-90%); b. 05/2017 catheter directed tPA, mechanical thrombectomy, and stenting of the SMA.   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 b.) rate/rhythm maintained on oral metoprolol tartrate; chronically anticoagulated with standard dose apixaban.   Palpitations    Peripheral edema    Personal history of other malignant neoplasm of skin 06/01/2016   Pneumonia    Pre-diabetes    Primary osteoarthritis of both knees 06/13/2017   SBO (small bowel obstruction) ([place]) 08/06/2017   Skin cancer of nose    Superior mesenteric artery thrombosis (HCC)    Vitamin D deficiency     Medications:  Apixaban 5 mg BID (last patient reported dose was 2/10 PM)  Assessment: Patient is a 62 y/o F with medical history as above and including Afib and Hx DVT on apixaban who is admitted with Rt AKA stump infection / OM. Patient may need surgical intervention. Pharmacy consulted to initiate and mange heparin infusion while apixaban is on hold.   Baseline aPTT, PT-INR and, heparin level are pending. Baseline H&H, platelets within  normal limits.  Goal of Therapy:  Heparin level 0.3-0.7 units/ml aPTT 66 - 102 seconds Monitor platelets by anticoagulation protocol: Yes   Plan:   2/14 @ 0309:  aPTT = 64 ,  HL = 0.41 aPTT is SUBtherapeutic slightly,  HL is therapeutic - Will order heparin 1100 units IV X 1 bolus and increase drip rate to 1350 units/hr.  - Will recheck aPTT 6 hrs after rate change.  - Will recheck HL on 2/15 with AM labs.  --Follow aPTT until correlation established with heparin level. Check aPTT/heparin level tomorrow AM --Daily CBC per protocol while on IV heparin  Casady Voshell D 12/06/2022 4:19 AM

## 2022-12-06 NOTE — Consult Note (Signed)
ANTICOAGULATION CONSULT NOTE  Pharmacy Consult for IV Heparin Indication: atrial fibrillation and Hx DVT   Patient Measurements: Height: 5' 2"$  (157.5 cm) Weight: 99.8 kg (220 lb) IBW/kg (Calculated) : 50.1 Heparin Dosing Weight: 73.8 kg  Labs: Recent Labs    12/04/22 0451 12/04/22 0804 12/05/22 0451 12/05/22 1146 12/06/22 0309 12/06/22 1012 12/06/22 2104  HGB 10.6*  --  10.3*  --   --   --   --   HCT 34.0*  --  33.9*  --   --   --   --   PLT 209  --  198  --   --   --   --   APTT  --  98* 107*   < > 64* 63* 65*  LABPROT 13.9  --   --   --   --   --   --   INR 1.1  --   --   --   --   --   --   HEPARINUNFRC  --  0.95* 0.79*  --  0.41  --   --   CREATININE 0.88  --  0.78  --  0.80  --   --    < > = values in this interval not displayed.     Estimated Creatinine Clearance: 79.5 mL/min (by C-G formula based on SCr of 0.8 mg/dL).   Medical History: Past Medical History:  Diagnosis Date   Abdominal aortic atherosclerosis ([place])    a. 05/2017 CTA abd/pelvis: significant atherosclerotic dzs of the infrarenal abd Ao w/ some mural thrombus. No aneurysm or dissection.   Acute focal ischemia of small intestine (HCC)    Acute right-sided low back pain with right-sided sciatica 06/13/2017   AKI (acute kidney injury) ([place]) 07/29/2017   Anemia    Anginal pain ([place])    a. 08/2012 Lexiscan MV: EF 54%, non ischemia/infarct.   Anxiety and depression    Baker's cyst of knee, right    a. 07/2016 U/S: 4.1 x 1.4 x 2.9 cystic structure in R poplitetal fossa.   Bell's palsy    CAD (coronary artery disease)    CHF (congestive heart failure) ([place])    a.) TTE 06/20/2017: EF 60-65%, no rwma, G1DD, no source of cardiac emboli.   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 05/2017 CTA  Abd/pelvis: apparent thrombus or embolus in prox SMA (70-90%); b. 05/2017 catheter directed tPA, mechanical thrombectomy, and stenting of the SMA.   Essential hypertension with goal blood pressure less than 140/90 01/19/2016   Gastroesophageal reflux disease 01/19/2016   GERD (gastroesophageal reflux disease)    H/O colectomy    History of 2019 novel coronavirus disease (COVID-19) 11/14/2019   History of kidney stones    Hyperlipidemia    Hypertension    Morbid obesity with BMI of 40.0-44.9, adult ([place])    NSTEMI (non-ST elevated myocardial infarction) ([place]) 06/27/2021   a.) high sensitivity troponins trended: 893 --> 1587 --> 1748 --> 868 --> 735 ng/L   Occlusive mesenteric ischemia ([place]) 06/19/2017   Osteoarthritis    PAD (peripheral artery disease) (HCC)    PAF (paroxysmal atrial fibrillation) (HCC)    a.) CHA2DS2-VASc = 6 (sex, CHF, HTN, DVT x2, aortic plaque). b.) rate/rhythm maintained on  oral metoprolol tartrate; chronically anticoagulated with standard dose apixaban.   Palpitations    Peripheral edema    Personal history of other malignant neoplasm of skin 06/01/2016   Pneumonia    Pre-diabetes    Primary osteoarthritis of both knees 06/13/2017   SBO (small bowel obstruction) ([place]) 08/06/2017   Skin cancer of nose    Superior mesenteric artery thrombosis (HCC)    Vitamin D deficiency     Medications:  Apixaban 5 mg BID (last patient reported dose was 2/10 PM)  Assessment: Patient is a 62 y/o F with medical history as above and including Afib and Hx DVT on apixaban who is admitted with Rt AKA stump infection / OM. Patient may need surgical intervention. Pharmacy consulted to initiate and mange heparin infusion while apixaban is on hold.   Goal of Therapy:  Heparin level 0.3-0.7 units/ml aPTT 66 - 102 seconds Monitor platelets by anticoagulation protocol: Yes   Plan:  aPTT slightly subtherapeutic following previous rate adjustment Bolus heparin 1100 units x 1 Increase  heparin infusion rate to 1550 units/hr.  Will recheck aPTT  and HL6 hrs after rate change as appears HL and aPTT beginning to correlate Follow aPTT until correlation established with heparin level. Check aPTT/heparin level tomorrow AM Daily CBC per protocol while on IV heparin  Dorothe Pea, PharmD, BCPS Clinical Pharmacist   12/06/2022 9:41 PM

## 2022-12-07 ENCOUNTER — Encounter
Admission: EM | Disposition: A | Discharge status: Skilled Nursing Facility | Payer: Self-pay | Source: Home / Self Care | Attending: Family Medicine

## 2022-12-07 ENCOUNTER — Encounter: Payer: Self-pay | Admitting: Vascular Surgery

## 2022-12-07 ENCOUNTER — Other Ambulatory Visit: Payer: Self-pay

## 2022-12-07 ENCOUNTER — Inpatient Hospital Stay: Payer: 59 | Admitting: Anesthesiology

## 2022-12-07 DIAGNOSIS — T8789 Other complications of amputation stump: Secondary | ICD-10-CM | POA: Diagnosis not present

## 2022-12-07 DIAGNOSIS — T8743 Infection of amputation stump, right lower extremity: Secondary | ICD-10-CM | POA: Diagnosis not present

## 2022-12-07 DIAGNOSIS — I739 Peripheral vascular disease, unspecified: Secondary | ICD-10-CM | POA: Diagnosis not present

## 2022-12-07 DIAGNOSIS — M7981 Nontraumatic hematoma of soft tissue: Secondary | ICD-10-CM

## 2022-12-07 DIAGNOSIS — Z89611 Acquired absence of right leg above knee: Secondary | ICD-10-CM | POA: Diagnosis not present

## 2022-12-07 DIAGNOSIS — Z7901 Long term (current) use of anticoagulants: Secondary | ICD-10-CM

## 2022-12-07 DIAGNOSIS — M86151 Other acute osteomyelitis, right femur: Secondary | ICD-10-CM | POA: Diagnosis not present

## 2022-12-07 HISTORY — PX: AMPUTATION: SHX166

## 2022-12-07 HISTORY — PX: APPLICATION OF WOUND VAC: SHX5189

## 2022-12-07 LAB — SURGICAL PCR SCREEN
MRSA, PCR: POSITIVE — AB
Staphylococcus aureus: POSITIVE — AB

## 2022-12-07 LAB — HEPARIN LEVEL (UNFRACTIONATED)
Heparin Unfractionated: 0.53 IU/mL (ref 0.30–0.70)
Heparin Unfractionated: <0.1 IU/mL — ABNORMAL LOW (ref 0.30–0.70)

## 2022-12-07 LAB — CREATININE, SERUM
Creatinine, Ser: 0.61 mg/dL (ref 0.44–1.00)
GFR, Estimated: >60 mL/min (ref >60)

## 2022-12-07 LAB — APTT: aPTT: 82 seconds — ABNORMAL HIGH (ref 24–36)

## 2022-12-07 SURGERY — APPLICATION, WOUND VAC
Anesthesia: General | Site: Knee

## 2022-12-07 MED ORDER — FENTANYL CITRATE (PF) 100 MCG/2ML IJ SOLN
25.0000 ug | INTRAMUSCULAR | Status: DC | PRN
  Administered 2022-12-07 (×3): 25 ug via INTRAVENOUS

## 2022-12-07 MED ORDER — ONDANSETRON HCL 4 MG/2ML IJ SOLN: 4.0000 mg | Freq: Once | INTRAMUSCULAR | Status: DC | PRN

## 2022-12-07 MED ORDER — FENTANYL CITRATE (PF) 100 MCG/2ML IJ SOLN
INTRAMUSCULAR | Status: AC
  Administered 2022-12-07: 25 ug via INTRAVENOUS
  Filled 2022-12-07: qty 2

## 2022-12-07 MED ORDER — PHENYLEPHRINE HCL-NACL 20-0.9 MG/250ML-% IV SOLN
INTRAVENOUS | Status: DC | PRN
  Administered 2022-12-07: 25 ug/min via INTRAVENOUS

## 2022-12-07 MED ORDER — ROCURONIUM BROMIDE 100 MG/10ML IV SOLN
INTRAVENOUS | Status: DC | PRN
  Administered 2022-12-07: 50 mg via INTRAVENOUS

## 2022-12-07 MED ORDER — SUGAMMADEX SODIUM 200 MG/2ML IV SOLN
INTRAVENOUS | Status: DC | PRN
  Administered 2022-12-07: 200 mg via INTRAVENOUS

## 2022-12-07 MED ORDER — FENTANYL CITRATE PF 50 MCG/ML IJ SOSY
12.5000 ug | PREFILLED_SYRINGE | Freq: Once | INTRAMUSCULAR | Status: AC | PRN
  Administered 2022-12-08: 12.5 ug via INTRAVENOUS
  Filled 2022-12-07: qty 1

## 2022-12-07 MED ORDER — MIDAZOLAM HCL 2 MG/2ML IJ SOLN
INTRAMUSCULAR | Status: AC
  Filled 2022-12-07: qty 2

## 2022-12-07 MED ORDER — ONDANSETRON HCL 4 MG/2ML IJ SOLN
INTRAMUSCULAR | Status: DC | PRN
  Administered 2022-12-07 (×2): 4 mg via INTRAVENOUS

## 2022-12-07 MED ORDER — MUPIROCIN 2 % EX OINT
1.0000 | TOPICAL_OINTMENT | Freq: Two times a day (BID) | CUTANEOUS | Status: AC
  Administered 2022-12-07 – 2022-12-11 (×10): 1 via NASAL
  Filled 2022-12-07: qty 22

## 2022-12-07 MED ORDER — DEXMEDETOMIDINE HCL IN NACL 200 MCG/50ML IV SOLN
INTRAVENOUS | Status: DC | PRN
  Administered 2022-12-07: 12 ug via INTRAVENOUS

## 2022-12-07 MED ORDER — PHENYLEPHRINE HCL-NACL 20-0.9 MG/250ML-% IV SOLN
INTRAVENOUS | Status: AC
  Filled 2022-12-07: qty 250

## 2022-12-07 MED ORDER — EPHEDRINE SULFATE (PRESSORS) 50 MG/ML IJ SOLN
INTRAMUSCULAR | Status: DC | PRN
  Administered 2022-12-07: 5 mg via INTRAVENOUS

## 2022-12-07 MED ORDER — CHLORHEXIDINE GLUCONATE CLOTH 2 % EX PADS
6.0000 | MEDICATED_PAD | Freq: Every day | CUTANEOUS | Status: AC
  Administered 2022-12-07 – 2022-12-11 (×5): 6 via TOPICAL

## 2022-12-07 MED ORDER — LIDOCAINE HCL (CARDIAC) PF 100 MG/5ML IV SOSY
PREFILLED_SYRINGE | INTRAVENOUS | Status: DC | PRN
  Administered 2022-12-07: 100 mg via INTRAVENOUS

## 2022-12-07 MED ORDER — GLYCOPYRROLATE 0.2 MG/ML IJ SOLN
INTRAMUSCULAR | Status: DC | PRN
  Administered 2022-12-07: .2 mg via INTRAVENOUS

## 2022-12-07 MED ORDER — HEPARIN (PORCINE) 25000 UT/250ML-% IV SOLN
1550.0000 [IU]/h | INTRAVENOUS | Status: DC
  Administered 2022-12-07 – 2022-12-09 (×3): 1550 [IU]/h via INTRAVENOUS
  Filled 2022-12-07 (×3): qty 250

## 2022-12-07 MED ORDER — 0.9 % SODIUM CHLORIDE (POUR BTL) OPTIME
TOPICAL | Status: DC | PRN
  Administered 2022-12-07: 1000 mL

## 2022-12-07 MED ORDER — ONDANSETRON HCL 4 MG/2ML IJ SOLN: 4.0000 mg | Freq: Four times a day (QID) | INTRAMUSCULAR | Status: DC | PRN

## 2022-12-07 MED ORDER — MUPIROCIN 2 % EX OINT
1.0000 | TOPICAL_OINTMENT | Freq: Two times a day (BID) | CUTANEOUS | Status: DC
  Filled 2022-12-07: qty 22

## 2022-12-07 MED ORDER — CEFAZOLIN SODIUM-DEXTROSE 2-4 GM/100ML-% IV SOLN
INTRAVENOUS | Status: AC
  Filled 2022-12-07: qty 100

## 2022-12-07 MED ORDER — LACTATED RINGERS IV SOLN: INTRAVENOUS | Status: DC | PRN

## 2022-12-07 MED ORDER — FENTANYL CITRATE (PF) 100 MCG/2ML IJ SOLN
INTRAMUSCULAR | Status: DC | PRN
  Administered 2022-12-07: 50 ug via INTRAVENOUS

## 2022-12-07 MED ORDER — HYDROMORPHONE HCL 1 MG/ML IJ SOLN
1.0000 mg | Freq: Once | INTRAMUSCULAR | Status: AC | PRN
  Administered 2022-12-07: 1 mg via INTRAVENOUS
  Filled 2022-12-07: qty 1

## 2022-12-07 MED ORDER — HYDROMORPHONE HCL 1 MG/ML IJ SOLN
1.0000 mg | Freq: Once | INTRAMUSCULAR | Status: AC
  Administered 2022-12-07: 1 mg via INTRAVENOUS
  Filled 2022-12-07: qty 1

## 2022-12-07 MED ORDER — FENTANYL CITRATE (PF) 100 MCG/2ML IJ SOLN
INTRAMUSCULAR | Status: AC
  Filled 2022-12-07: qty 2

## 2022-12-07 MED ORDER — MIDAZOLAM HCL 2 MG/2ML IJ SOLN
1.0000 mg | Freq: Once | INTRAMUSCULAR | Status: AC
  Administered 2022-12-07: 1 mg via INTRAVENOUS

## 2022-12-07 MED ORDER — PHENYLEPHRINE 80 MCG/ML (10ML) SYRINGE FOR IV PUSH (FOR BLOOD PRESSURE SUPPORT)
PREFILLED_SYRINGE | INTRAVENOUS | Status: DC | PRN
  Administered 2022-12-07 (×2): 160 ug via INTRAVENOUS

## 2022-12-07 MED ORDER — PHENYLEPHRINE HCL (PRESSORS) 10 MG/ML IV SOLN
INTRAVENOUS | Status: AC
  Filled 2022-12-07: qty 1

## 2022-12-07 MED ORDER — PROPOFOL 10 MG/ML IV BOLUS
INTRAVENOUS | Status: DC | PRN
  Administered 2022-12-07: 100 mg via INTRAVENOUS
  Administered 2022-12-07: 20 mg via INTRAVENOUS

## 2022-12-07 SURGICAL SUPPLY — 41 items
BLADE SAGITTAL WIDE XTHICK NO (BLADE) ×2 IMPLANT
BNDG COHESIVE 4X5 TAN STRL LF (GAUZE/BANDAGES/DRESSINGS) ×2 IMPLANT
BNDG ELASTIC 6X5.8 VLCR NS LF (GAUZE/BANDAGES/DRESSINGS) ×2 IMPLANT
BNDG GAUZE DERMACEA FLUFF 4 (GAUZE/BANDAGES/DRESSINGS) ×4 IMPLANT
BRUSH SCRUB EZ  4% CHG (MISCELLANEOUS) ×2
BRUSH SCRUB EZ 4% CHG (MISCELLANEOUS) ×2 IMPLANT
CANISTER WOUND CARE 500ML ATS (WOUND CARE) IMPLANT
CHLORAPREP W/TINT 26 (MISCELLANEOUS) ×2 IMPLANT
DRAPE INCISE IOBAN 66X45 STRL (DRAPES) ×2 IMPLANT
DRAPE INCISE IOBAN 66X60 STRL (DRAPES) ×2 IMPLANT
DRSG VAC GRANUFOAM MED (GAUZE/BANDAGES/DRESSINGS) IMPLANT
ELECT CAUTERY BLADE 6.4 (BLADE) ×2 IMPLANT
ELECT REM PT RETURN 9FT ADLT (ELECTROSURGICAL) ×2
ELECTRODE REM PT RTRN 9FT ADLT (ELECTROSURGICAL) ×2 IMPLANT
GAUZE XEROFORM 1X8 LF (GAUZE/BANDAGES/DRESSINGS) ×4 IMPLANT
GLOVE BIO SURGEON STRL SZ7 (GLOVE) ×4 IMPLANT
GOWN STRL REUS W/ TWL LRG LVL3 (GOWN DISPOSABLE) ×4 IMPLANT
GOWN STRL REUS W/ TWL XL LVL3 (GOWN DISPOSABLE) ×2 IMPLANT
GOWN STRL REUS W/TWL LRG LVL3 (GOWN DISPOSABLE) ×4
GOWN STRL REUS W/TWL XL LVL3 (GOWN DISPOSABLE) ×2
HANDLE YANKAUER SUCT BULB TIP (MISCELLANEOUS) ×2 IMPLANT
HEMOSTAT SURGICEL 2X14 (HEMOSTASIS) IMPLANT
KIT TURNOVER KIT A (KITS) ×2 IMPLANT
LABEL OR SOLS (LABEL) ×2 IMPLANT
MANIFOLD NEPTUNE II (INSTRUMENTS) ×2 IMPLANT
MAT ABSORB  FLUID 56X50 GRAY (MISCELLANEOUS) ×2
MAT ABSORB FLUID 56X50 GRAY (MISCELLANEOUS) ×2 IMPLANT
NS IRRIG 1000ML POUR BTL (IV SOLUTION) ×2 IMPLANT
PACK EXTREMITY ARMC (MISCELLANEOUS) ×2 IMPLANT
PAD ABD DERMACEA PRESS 5X9 (GAUZE/BANDAGES/DRESSINGS) ×4 IMPLANT
PAD PREP 24X41 OB/GYN DISP (PERSONAL CARE ITEMS) ×2 IMPLANT
SPONGE T-LAP 18X18 ~~LOC~~+RFID (SPONGE) ×4 IMPLANT
STAPLER SKIN PROX 35W (STAPLE) ×2 IMPLANT
STOCKINETTE M/LG 89821 (MISCELLANEOUS) ×2 IMPLANT
SUT SILK 2 0 (SUTURE) ×2
SUT SILK 2 0 SH (SUTURE) ×4 IMPLANT
SUT SILK 2-0 18XBRD TIE 12 (SUTURE) ×2 IMPLANT
SUT VIC AB 0 CT1 36 (SUTURE) ×4 IMPLANT
SUT VIC AB 2-0 CT1 (SUTURE) ×4 IMPLANT
TRAP FLUID SMOKE EVACUATOR (MISCELLANEOUS) ×2 IMPLANT
WATER STERILE IRR 500ML POUR (IV SOLUTION) ×2 IMPLANT

## 2022-12-07 NOTE — Progress Notes (Signed)
   12/07/22 0525  Provider Notification  Provider Name/Title Dr. Lucky Cowboy  Date Provider Notified 12/07/22  Time Provider Notified 0530  Method of Notification  (secure chat)  Notification Reason Other (Comment) (MRSA surgical PCR positive)  Provider response No new orders (MRSA protocol initiated.)  Date of Provider Response  (awaiting. Protocol Initiated.)

## 2022-12-07 NOTE — Transfer of Care (Signed)
Immediate Anesthesia Transfer of Care Note  Patient: Carla [name]  Procedure(s) Performed: AMPUTATION ABOVE KNEE-Revision (Right: Knee) APPLICATION OF WOUND VAC  Patient Location: PACU  Anesthesia Type:General  Level of Consciousness: awake, drowsy, and patient cooperative  Airway & Oxygen Therapy: Patient Spontanous Breathing and Patient connected to nasal cannula oxygen  Post-op Assessment: Report given to RN and Post -op Vital signs reviewed and stable  Post vital signs: Reviewed and stable  Last Vitals:  Vitals Value Taken Time  BP 121/47 12/07/22 0945  Temp 37.1 C 12/07/22 0945  Pulse 99 12/07/22 0950  Resp 17 12/07/22 0950  SpO2 100 % 12/07/22 0950  Vitals shown include unvalidated device data.  Last Pain:  Vitals:   12/07/22 0945  TempSrc:   PainSc: Asleep      Patients Stated Pain Goal: 2 [id number] [phone])  Complications: No notable events documented.

## 2022-12-07 NOTE — Progress Notes (Signed)
PROGRESS NOTE    Carla [name]  [id number] DOB: [birth year] DOA: 12/03/2022 [facility]CP: [facility]   Brief Narrative:  This 62 y.o. female with medical history significant of CHF , A-fib , peripheral vascular disease , COPD,  hypertension,  hyperlipidemia,  arthritis,  scoliosis and depression.  She complains of leg pain, heart fluttering,  dizziness and shortness of breath.  For the past 2-1/2 months she is having open wound on her right leg.  Her last dose of Eliquis was last night.  Also been very fatigued and sweating.  Has not noticed a fever.  CT scan of the femur shows stump osteomyelitis, complex fluid collection measuring 7 x 6 x 7.4 cm, tract from the collection to the open wound.  Patient was started on vancomycin and Zosyn and hospitalist services contacted for further evaluation.  Vascular surgery was consulted. Patient underwent Evacuation of hematoma from right above-knee amputation site, revision of amputation with resection of femur, negative pressure dressing placement.   Assessment & Plan:   Principal Problem:   Acute osteomyelitis of right thigh (HCC) Active Problems:   Hypokalemia   PVD (peripheral vascular disease) (HCC)   Paroxysmal atrial fibrillation (HCC)   Hyperlipidemia, unspecified   Chronic pain   Obesity, Class III, BMI 40-49.9 (morbid obesity) ([place])   Essential hypertension   GERD (gastroesophageal reflux disease)   Neuropathy   Diarrhea  Acute osteomyelitis of right thigh: She presented with stump infection.  CT A/P: Showed fluid collection around the graft slightly increased in the interval. 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  Likely will need an AKA revision.  Case discussed with vascular surgery.  Eliquis was discontinued.  Started on heparin drip.  Continue empiric antibiotics (vancomycin and Zosyn).  Follow-up wound care. Vascular surgery consulted,  s/p Evacuation of hematoma from  right above-knee amputation site, revision of amputation with resection of femur, negative pressure dressing placement.   Continue adequate pain control.  Hypokalemia: Replaced and resolved.   Peripheral vascular disease: Patient has a predisposition for developing blood clots,  will bridge with heparin drip at this point.   Paroxysmal atrial fibrillation: Eliquis kept on hold. Started on heparin for bridging.  Continue  metoprolol for rate control.   Hyperlipidemia, unspecified Continue statin.   Chronic pain: Continue her pain regimen.   Obesity, Class III, BMI 40-49.9 (morbid obesity) (HCC) BMI 40.24 with current height and weight in computer   Diarrhea: Improved No stool studies.  Hold MiraLAX.   Neuropathy: Continue Lyrica 200 mg three times daily.   GERD (gastroesophageal reflux disease) Continue pantoprazole 40 mg daily.   Essential hypertension: Continue metoprolol 12.5 mg q12hr.  Anxiety: Continue Xanax 0.5 mg twice daily as needed for anxiety.  DVT prophylaxis: Heparin IV Code Status: Full code. Family Communication: No family at bed side. Disposition Plan:   Status is: Inpatient Remains inpatient appropriate because: Admitted for acute osteomyelitis of the stump.  Vascular surgery consulted   s/p Evacuation of hematoma from right above-knee amputation site, revision of amputation with resection of femur, negative pressure dressing placement.    Consultants:  Vascular surgery  Procedures: Antimicrobials:  Anti-infectives (From admission, onward)    Start     Dose/Rate Route Frequency Ordered Stop   12/07/22 0833  ceFAZolin (ANCEF) 2-4 GM/100ML-% IVPB       Note to Pharmacy: Jeanene Erb E: cabinet override      12/07/22 [phone] 12/07/22 0911  12/06/22 2212  ceFAZolin (ANCEF) IVPB 2g/100 mL premix        2 g 200 mL/hr over 30 Minutes Intravenous 30 min pre-op 12/06/22 2212 12/07/22 0847   12/06/22 2200  meropenem (MERREM) 1 g in sodium chloride  0.9 % 100 mL IVPB        1 g 200 mL/hr over 30 Minutes Intravenous Every 8 hours 12/06/22 1634     12/04/22 1000  vancomycin (VANCOREADY) IVPB 1250 mg/250 mL  Status:  Discontinued        1,250 mg 166.7 mL/hr over 90 Minutes Intravenous Every 24 hours 12/03/22 0802 12/06/22 1634   12/03/22 1400  piperacillin-tazobactam (ZOSYN) IVPB 3.375 g        3.375 g 12.5 mL/hr over 240 Minutes Intravenous Every 8 hours 12/03/22 0801 12/06/22 2358   12/03/22 0630  piperacillin-tazobactam (ZOSYN) IVPB 3.375 g        3.375 g 100 mL/hr over 30 Minutes Intravenous  Once 12/03/22 0623 12/03/22 0715   12/03/22 0630  vancomycin (VANCOREADY) IVPB 2000 mg/400 mL        2,000 mg 200 mL/hr over 120 Minutes Intravenous  Once 12/03/22 [id number] 12/03/22 0916      Subjective: Patient was seen and examined at bedside.  Overnight events noted.   She still reports having significant pain in right stump. She is s/p right AKA revision. POD # 1  Objective: Vitals:   12/07/22 1015 12/07/22 1030 12/07/22 1046 12/07/22 1121  BP: 122/75 128/74 (!) 157/59 (!) 146/75  Pulse: (!) 104 98 90 91  Resp: 15 11 16 $ (!) 22  Temp:  98 F (36.7 C) 97.8 F (36.6 C) 98 F (36.7 C)  TempSrc:   Oral   SpO2: 97% 96% 97% 97%  Weight:      Height:        Intake/Output Summary (Last 24 hours) at 12/07/2022 1215 Last data filed at 12/07/2022 0935 Gross per 24 hour  Intake 1772.96 ml  Output 3050 ml  Net -1277.04 ml   Filed Weights   12/03/22 0319  Weight: 99.8 kg    Examination:  General exam: Appears comfortable, deconditioned, not in any distress. Respiratory system: CTA bilaterally. Respiratory effort normal.  RR 15 Cardiovascular system: S1 & S2 heard, regular rate and rhythm, no murmur. Gastrointestinal system: Abdomen is soft, non tender, non distended, BS+ Central nervous system: Alert and oriented x 3 . No focal neurological deficits. Extremities: S/p right AKA revision.  Dressing noted. Skin: No rashes, lesions or  ulcers Psychiatry: Judgement and insight appear normal. Mood & affect appropriate.     Data Reviewed: I have personally reviewed following labs and imaging studies  CBC: Recent Labs  Lab 12/03/22 0359 12/04/22 0451 12/05/22 0451  WBC 10.8* 6.6 6.4  NEUTROABS 7.2  --   --   HGB 14.0 10.6* 10.3*  HCT 44.4 34.0* 33.9*  MCV 83.8 86.3 86.3  PLT 272 209 [id number]   Basic Metabolic Panel: Recent Labs  Lab 12/03/22 0358 12/03/22 0359 12/04/22 0451 12/05/22 0451 12/06/22 0309 12/07/22 0524  NA  --  139 138 139  --   --   K  --  3.0* 3.3* 3.9  --   --   CL  --  107 108 112*  --   --   CO2  --  21* 21* 20*  --   --   GLUCOSE  --  125* 123* 111*  --   --   BUN  --  $14 15 10  H$ --   --   CREATININE  --  0.80 0.88 0.78 0.80 0.61  CALCIUM  --  9.4 7.9* 8.0*  --   --   MG 2.2  --   --  1.9  --   --   PHOS  --   --   --  4.1  --   --    GFR: Estimated Creatinine Clearance: 79.5 mL/min (by C-G formula based on SCr of 0.61 mg/dL). Liver Function Tests: Recent Labs  Lab 12/03/22 0359  AST 23  ALT 15  ALKPHOS 121  BILITOT 1.0  PROT 7.9  ALBUMIN 3.7   No results for input(s): "LIPASE", "AMYLASE" in the last 168 hours. No results for input(s): "AMMONIA" in the last 168 hours. Coagulation Profile: Recent Labs  Lab 12/03/22 0832 12/04/22 0451  INR 1.1 1.1   Cardiac Enzymes: No results for input(s): "CKTOTAL", "CKMB", "CKMBINDEX", "TROPONINI" in the last 168 hours. BNP (last 3 results) No results for input(s): "PROBNP" in the last 8760 hours. HbA1C: No results for input(s): "HGBA1C" in the last 72 hours. CBG: No results for input(s): "GLUCAP" in the last 168 hours. Lipid Profile: No results for input(s): "CHOL", "HDL", "LDLCALC", "TRIG", "CHOLHDL", "LDLDIRECT" in the last 72 hours. Thyroid Function Tests: No results for input(s): "TSH", "T4TOTAL", "FREET4", "T3FREE", "THYROIDAB" in the last 72 hours. Anemia Panel: No results for input(s): "VITAMINB12", "FOLATE", "FERRITIN",  "TIBC", "IRON", "RETICCTPCT" in the last 72 hours. Sepsis Labs: Recent Labs  Lab 12/03/22 0358  PROCALCITON <0.10    Recent Results (from the past 240 hour(s))  Resp panel by RT-PCR (RSV, Flu A&B, Covid) Anterior Nasal Swab     Status: None   Collection Time: 12/03/22  3:59 AM   Specimen: Anterior Nasal Swab  Result Value Ref Range Status   SARS Coronavirus 2 by RT PCR NEGATIVE NEGATIVE Final    Comment: (NOTE) SARS-CoV-2 target nucleic acids are NOT DETECTED.  The SARS-CoV-2 RNA is generally detectable in upper respiratory specimens during the acute phase of infection. The lowest concentration of SARS-CoV-2 viral copies this assay can detect is 138 copies/mL. A negative result does not preclude SARS-Cov-2 infection and should not be used as the sole basis for treatment or other patient management decisions. A negative result may occur with  improper specimen collection/handling, submission of specimen other than nasopharyngeal swab, presence of viral mutation(s) within the areas targeted by this assay, and inadequate number of viral copies(<138 copies/mL). A negative result must be combined with clinical observations, patient history, and epidemiological information. The expected result is Negative.  Fact Sheet for Patients:  EntrepreneurPulse.com.au  Fact Sheet for Healthcare Providers:  IncredibleEmployment.be  This test is no t yet approved or cleared by the [place] FDA and  has been authorized for detection and/or diagnosis of SARS-CoV-2 by FDA under an Emergency Use Authorization (EUA). This EUA will remain  in effect (meaning this test can be used) for the duration of the COVID-19 declaration under Section 564(b)(1) of the Act, 21 U.S.C.section 360bbb-3(b)(1), unless the authorization is terminated  or revoked sooner.       Influenza A by PCR NEGATIVE NEGATIVE Final   Influenza B by PCR NEGATIVE NEGATIVE Final    Comment:  (NOTE) The Xpert Xpress SARS-CoV-2/FLU/RSV plus assay is intended as an aid in the diagnosis of influenza from Nasopharyngeal swab specimens and should not be used as a sole basis for treatment. Nasal washings and aspirates are unacceptable for Xpert Xpress SARS-CoV-2/FLU/RSV  testing.  Fact Sheet for Patients: EntrepreneurPulse.com.au  Fact Sheet for Healthcare Providers: IncredibleEmployment.be  This test is not yet approved or cleared by the [place] FDA and has been authorized for detection and/or diagnosis of SARS-CoV-2 by FDA under an Emergency Use Authorization (EUA). This EUA will remain in effect (meaning this test can be used) for the duration of the COVID-19 declaration under Section 564(b)(1) of the Act, 21 U.S.C. section 360bbb-3(b)(1), unless the authorization is terminated or revoked.     Resp Syncytial Virus by PCR NEGATIVE NEGATIVE Final    Comment: (NOTE) Fact Sheet for Patients: EntrepreneurPulse.com.au  Fact Sheet for Healthcare Providers: IncredibleEmployment.be  This test is not yet approved or cleared by the [place] FDA and has been authorized for detection and/or diagnosis of SARS-CoV-2 by FDA under an Emergency Use Authorization (EUA). This EUA will remain in effect (meaning this test can be used) for the duration of the COVID-19 declaration under Section 564(b)(1) of the Act, 21 U.S.C. section 360bbb-3(b)(1), unless the authorization is terminated or revoked.  Performed at [facility], [address] [place], [place] [address]   Aerobic/Anaerobic Culture w Gram Stain (surgical/deep wound)     Status: None (Preliminary result)   Collection Time: 12/03/22  9:56 AM   Specimen: Abscess; Wound  Result Value Ref Range Status   Specimen Description   Final    ABSCESS Performed at [facility], [address], [place], [place] [address]    Special  Requests   Final    Normal Performed at [facility], [address] [place], [place] [address]    Gram Stain   Final    MODERATE WBC PRESENT, PREDOMINANTLY PMN NO ORGANISMS SEEN Performed at [facility], [place] [address] [place], [place] [address]    Culture   Final    RARE ESCHERICHIA COLI Confirmed Extended Spectrum Beta-Lactamase Producer (ESBL).  In bloodstream infections from ESBL organisms, carbapenems are preferred over piperacillin/tazobactam. They are shown to have a lower risk of mortality. RARE ENTEROCOCCUS FAECALIS NO ANAEROBES ISOLATED; CULTURE IN PROGRESS FOR 5 DAYS    Report Status PENDING  Incomplete   Organism ID, Bacteria ESCHERICHIA COLI  Final   Organism ID, Bacteria ENTEROCOCCUS FAECALIS  Final      Susceptibility   Escherichia coli - MIC*    AMPICILLIN >=32 RESISTANT Resistant     CEFEPIME >=32 RESISTANT Resistant     CEFTAZIDIME RESISTANT Resistant     CEFTRIAXONE >=64 RESISTANT Resistant     CIPROFLOXACIN >=4 RESISTANT Resistant     GENTAMICIN <=1 SENSITIVE Sensitive     IMIPENEM <=0.25 SENSITIVE Sensitive     TRIMETH/SULFA >=320 RESISTANT Resistant     AMPICILLIN/SULBACTAM 16 INTERMEDIATE Intermediate     PIP/TAZO <=4 SENSITIVE Sensitive     * RARE ESCHERICHIA COLI   Enterococcus faecalis - MIC*    AMPICILLIN <=2 SENSITIVE Sensitive     VANCOMYCIN 1 SENSITIVE Sensitive     GENTAMICIN SYNERGY SENSITIVE Sensitive     * RARE ENTEROCOCCUS FAECALIS  Surgical PCR screen     Status: Abnormal   Collection Time: 12/07/22  2:38 AM   Specimen: Nasal Mucosa; Nasal Swab  Result Value Ref Range Status   MRSA, PCR POSITIVE (A) NEGATIVE Final    Comment: CRITICAL RESULT CALLED TO, READ BACK BY AND VERIFIED WITH: ERWIN MACROHON RN @0512$  12/07/22 ASW    Staphylococcus aureus POSITIVE (A) NEGATIVE Final    Comment: CRITICAL RESULT CALLED TO, READ BACK  BY AND VERIFIED WITH: Erlene Quan RN @0512$  12/07/22 ASW (NOTE) The Xpert SA Assay (FDA  approved for NASAL specimens in patients 22 years of age and older), is one component of a comprehensive surveillance program. It is not intended to diagnose infection nor to guide or monitor treatment. Performed at [facility], [address], [place], [place] [address]     Radiology Studies: No results found.  Scheduled Meds:  atorvastatin  80 mg Oral QHS   Chlorhexidine Gluconate Cloth  6 each Topical Q0600   ferrous sulfate  325 mg Oral Q breakfast    HYDROmorphone (DILAUDID) injection  1 mg Intravenous Once   isosorbide mononitrate  60 mg Oral Daily   lidocaine  1 patch Transdermal Q24H   metoprolol tartrate  12.5 mg Oral BID   morphine  15 mg Oral Q12H   multivitamin with minerals  1 tablet Oral Daily   mupirocin ointment  1 Application Nasal BID   pantoprazole  40 mg Oral Daily   pregabalin  200 mg Oral TID   Continuous Infusions:  heparin Stopped (12/07/22 0802)   meropenem (MERREM) IV Stopped (12/07/22 0541)     LOS: 4 days    Time spent: 35 mins    Carla Groseclose, MD [facility]   If 7PM-7AM, please contact night-coverage

## 2022-12-07 NOTE — Plan of Care (Signed)

## 2022-12-07 NOTE — Op Note (Addendum)
    OPERATIVE NOTE   PROCEDURE: Evacuation of hematoma from right above-knee amputation site, revision of amputation with resection of femur, non-disposable negative pressure dressing placement.  PRE-OPERATIVE DIAGNOSIS: osteomyelitis of right femur after previous AKA with abscess  POST-OPERATIVE DIAGNOSIS: Possible osteomyelitis of right femur but no abscess with only blood collection around the bone in an anticoagulated patient  SURGEON: [name], MD  ASSISTANT(S): none  ANESTHESIA: general  ESTIMATED BLOOD LOSS: 25 cc  FINDING(S): No abscess.  The distal portion of the femur was resected and sent for culture for osteomyelitis.  Collection of blood around the bone and chronically anticoagulated patient  SPECIMEN(S): Culture swab of fluid and distal right femur sent as culture to check for osteomyelitis  INDICATIONS:   Carla [name] is a 62 y.o. female who presents with a persistent wound in her right above-knee amputation and an area of previous hematoma evacuation above the original incision.  Imaging studies suggested osteomyelitis of the femur and an abscess around the femur.  She is brought to the operating room for drainage of the abscess, revision of her amputation if significant infection is found.  Risks and benefits were discussed and informed consent was obtained  DESCRIPTION: After obtaining full informed written consent, the patient was brought back to the operating room and placed supine upon the operating table.  The patient received IV antibiotics prior to induction.  After obtaining adequate anesthesia, the patient was prepped and draped in the standard fashion.  The chronic wound was then opened and some unhealthy fat and soft tissue were resected down to the muscle fascia.  The femur was palpable and there was fluid that was detectable and so I opened the fascia that was overlying the femur.  On opening this pocket, a combination of clotted and nonclotted blood  products were identified with no purulent material or foul odor.  A culture swab was sent.  She is chronically anticoagulated.  This area was copiously evacuated and some fibrinous exudate and less healthy appearing muscle fascia was resected back in debrided.  This exposed the femur.  With possible osteomyelitis, I then used the bone saw and transected about 4 to 5 cm higher on the femur after this was dissected out and exposed well.  This was then pulled out with a Kocher and further dissection of the surrounding soft tissues.  The bone was somewhat soft and with the imaging study for possible osteomyelitis revision of the femur to a higher level was appropriate.  This required extending her skin incision a couple of centimeters medially and laterally.  The wound was then copiously irrigated.  The deep fascia overlying the femur was closed with a running 0 Vicryl and then the more superficial layer fascia was closed with 2-0 Vicryl.  A wound of approximately 8 to 9 cm in length and 5 to 6 cm in width remain and I elected to use a negative pressure dressing for wound management.  A VAC sponge was cut to fit the wound and strips of Ioban were used and occlusive seal was obtained.  This was connected to suction tubing with an excellent seal. This was a non-disposable unit.  The patient was then awakened from anesthesia and taken to the recovery room in stable condition having tolerated the procedure well.  COMPLICATIONS: none  CONDITION: stable  [name]  12/07/2022, 9:40 AM   This note was created with Dragon Medical transcription system. Any errors in dictation are purely unintentional.

## 2022-12-07 NOTE — Consult Note (Signed)
[place] for IV Heparin Indication: atrial fibrillation and Hx DVT   Patient Measurements: Height: 5' 2"$  (157.5 cm) Weight: 99.8 kg (220 lb) IBW/kg (Calculated) : 50.1 Heparin Dosing Weight: 73.8 kg  Labs: Recent Labs    12/05/22 0451 12/05/22 1146 12/06/22 0309 12/06/22 1012 12/06/22 2104 12/07/22 0524 12/07/22 1059  HGB 10.3*  --   --   --   --   --   --   HCT 33.9*  --   --   --   --   --   --   PLT 198  --   --   --   --   --   --   APTT 107*   < > 64* 63* 65* 82*  --   HEPARINUNFRC 0.79*  --  0.41  --   --  0.53 <0.10*  CREATININE 0.78  --  0.80  --   --  0.61  --    < > = values in this interval not displayed.     Estimated Creatinine Clearance: 79.5 mL/min (by C-G formula based on SCr of 0.61 mg/dL).   Medical History: Past Medical History:  Diagnosis Date   Abdominal aortic atherosclerosis ([place])    a. 05/2017 CTA abd/pelvis: significant atherosclerotic dzs of the infrarenal abd Ao w/ some mural thrombus. No aneurysm or dissection.   Acute focal ischemia of small intestine (HCC)    Acute right-sided low back pain with right-sided sciatica 06/13/2017   AKI (acute kidney injury) ([place]) 07/29/2017   Anemia    Anginal pain ([place])    a. 08/2012 Lexiscan MV: EF 54%, non ischemia/infarct.   Anxiety and depression    Baker's cyst of knee, right    a. 07/2016 U/S: 4.1 x 1.4 x 2.9 cystic structure in R poplitetal fossa.   Bell's palsy    CAD (coronary artery disease)    CHF (congestive heart failure) ([place])    a.) TTE 06/20/2017: EF 60-65%, no rwma, G1DD, no source of cardiac emboli.   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 05/2017 CTA Abd/pelvis: apparent thrombus or embolus in prox SMA (70-90%); b. 05/2017 catheter directed  tPA, mechanical thrombectomy, and stenting of the SMA.   Essential hypertension with goal blood pressure less than 140/90 01/19/2016   Gastroesophageal reflux disease 01/19/2016   GERD (gastroesophageal reflux disease)    H/O colectomy    History of 2019 novel coronavirus disease (COVID-19) 11/14/2019   History of kidney stones    Hyperlipidemia    Hypertension    Morbid obesity with BMI of 40.0-44.9, adult ([place])    NSTEMI (non-ST elevated myocardial infarction) ([place]) 06/27/2021   a.) high sensitivity troponins trended: 893 --> 1587 --> 1748 --> 868 --> 735 ng/L   Occlusive mesenteric ischemia ([place]) 06/19/2017   Osteoarthritis    PAD (peripheral artery disease) (HCC)    PAF (paroxysmal atrial fibrillation) (HCC)    a.) CHA2DS2-VASc = 6 (sex, CHF, HTN, DVT x2, aortic plaque). b.) rate/rhythm maintained on oral metoprolol tartrate; chronically anticoagulated with standard dose apixaban.   Palpitations    Peripheral edema    Personal history of other malignant neoplasm of skin 06/01/2016   Pneumonia    Pre-diabetes  Primary osteoarthritis of both knees 06/13/2017   SBO (small bowel obstruction) ([place]) 08/06/2017   Skin cancer of nose    Superior mesenteric artery thrombosis (HCC)    Vitamin D deficiency     Medications:  Apixaban 5 mg BID (last patient reported dose was 2/10 PM)  Assessment: Patient is a 62 y/o F with medical history as above and including Afib and Hx DVT on apixaban who is admitted with Rt AKA stump infection / OM. Patient may need surgical intervention. Pharmacy consulted to initiate and mange heparin infusion while apixaban is on hold. 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    Goal of Therapy:  Heparin level 0.3-0.7 units/ml Monitor platelets by anticoagulation protocol: Yes   Plan:   Per RN, vascular wants to start heparin 1800. Will restart at  previous rate 1550 units/hr. Recheck heparin level in 6 hours. No false elevated from DOAC. CBC daily while heparin.    Oswald Hillock, PharmD, BCPS 12/07/2022 1:38 PM

## 2022-12-07 NOTE — Consult Note (Signed)
NAME: Carla [name]  DOB: Dec 06, 1960  MRN: [id number]  Date/Time: 12/07/2022 1:23 PM  REQUESTING PROVIDER: Dr. Dwyane Dee Subjective:  REASON FOR CONSULT: Rt AKA stump infection ? Carla [name] is a 62 y.o. 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  Resp 19  SpO2 97 %    Latest Reference Range & Units 12/03/22 03:59  WBC 4.0 - 10.5 K/uL 10.8 (H)  Hemoglobin 12.0 - 15.0 g/dL 14.0  HCT 36.0 - 46.0 % 44.4  Platelets 150 - 400 K/uL 272   Pt had 2 wounds at the AKA site and was started on IV vanco/piptazo, CT femur showed tracking abscess and osteo Culture sent  She was taken for revision today I am seeing her for ESBL Ecoli and enterococcus in the culture  Past Medical History:  Diagnosis Date   Abdominal aortic atherosclerosis ([place])    a. 05/2017 CTA abd/pelvis: significant atherosclerotic dzs of the infrarenal abd Ao w/ some mural thrombus. No aneurysm or dissection.   Acute focal ischemia of small intestine (HCC)    Acute right-sided low back pain with right-sided sciatica 06/13/2017   AKI (acute kidney injury) ([place]) 07/29/2017   Anemia    Anginal pain ([place])    a. 08/2012 Lexiscan MV: EF 54%, non ischemia/infarct.   Anxiety and depression    Baker's cyst of knee, right    a. 07/2016 U/S: 4.1 x 1.4 x 2.9 cystic structure in R poplitetal fossa.   Bell's palsy    CAD (coronary artery disease)    CHF (congestive heart failure) ([place])    a.) TTE 06/20/2017: EF 60-65%, no rwma, G1DD, no source of cardiac emboli.   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 05/2017 CTA Abd/pelvis: apparent thrombus or embolus in prox SMA (70-90%); b. 05/2017 catheter directed tPA, mechanical thrombectomy, and stenting of the SMA.   Essential hypertension with goal blood pressure less than 140/90 01/19/2016   Gastroesophageal reflux disease 01/19/2016   GERD (gastroesophageal reflux disease)    H/O colectomy    History of 2019 novel coronavirus disease (COVID-19) 11/14/2019   History of kidney stones    Hyperlipidemia    Hypertension    Morbid obesity with BMI of 40.0-44.9, adult ([place])    NSTEMI (non-ST elevated myocardial infarction) ([place]) 06/27/2021   a.) high sensitivity troponins trended: 893 --> 1587 --> 1748 --> 868 --> 735 ng/L   Occlusive mesenteric ischemia ([place]) 06/19/2017   Osteoarthritis    PAD (peripheral artery disease) (HCC)    PAF (paroxysmal atrial fibrillation) (HCC)    a.) CHA2DS2-VASc = 6 (sex, CHF, HTN, DVT x2, aortic plaque). b.) rate/rhythm maintained on oral metoprolol tartrate; chronically anticoagulated with standard dose apixaban.   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[place])    Vitamin D deficiency     Past Surgical History:  Procedure Laterality Date   AMPUTATION Right 04/06/2022   Procedure: AMPUTATION ABOVE KNEE;  Surgeon: Algernon Huxley, MD;  Location: [facility];  Service: Vascular;  Laterality: Right;   AMPUTATION TOE Left 12/09/2019   Procedure: AMPUTATION TOE MPJ left;  Surgeon: Caroline More, DPM;  Location: [facility];  Service: Podiatry;  Laterality: Left;   APPENDECTOMY     APPLICATION OF WOUND VAC  12/01/2021   Procedure: APPLICATION OF WOUND VAC;  Surgeon: Algernon Huxley, MD;  Location: [facility];  Service: Vascular;;   APPLICATION OF WOUND VAC Right 05/03/2022    Procedure: APPLICATION OF WOUND VAC;  Surgeon: Algernon Huxley, MD;  Location: [facility];  Service: Vascular;  Laterality: Right;   APPLICATION OF WOUND VAC Left 07/23/2022   Procedure: APPLICATION OF WOUND VAC;  Surgeon: Serafina Mitchell, MD;  Location: [facility];  Service: Vascular;  Laterality: Left;  [place]   APPLICATION OF WOUND VAC Right 08/28/2022   Procedure: APPLICATION OF WOUND VAC;  Surgeon: Algernon Huxley, MD;  Location: [facility];  Service: Vascular;  Laterality: Right;   AXILLARY-FEMORAL BYPASS GRAFT Left 07/23/2022   Procedure: BYPASS GRAFT AXILLA-BIFEMORAL;  Surgeon: Serafina Mitchell, MD;  Location: [facility];  Service: Vascular;  Laterality: Left;   CHOLECYSTECTOMY     COLON SURGERY     COLONOSCOPY WITH PROPOFOL N/A 03/23/2022   Procedure: COLONOSCOPY WITH PROPOFOL;  Surgeon: Jonathon Bellows, MD;  Location: [facility];  Service: Gastroenterology;  Laterality: N/A;  rectal bleed   EMBOLECTOMY OR THROMBECTOMY, WITH OR WITHOUT CATHETER; FEMOROPOPLITEAL, AORTOILIAC ARTERY, BY LEG INCISION N/A 06/21/2020   Left - Decomp Fasciotomy Leg; Ant &/Or Lat Compart Only; Midline - Angiography, Visceral, Selective Or Supraselective (With Or Without Flush Aortogram); Left - Revascularize, Endovasc, Open/Percut, Iliac Artery, Unilat, Initial Vessel; W/Translum Stent, W/Angioplasty; Right - Revascularize, Endovasc, Open/Percut, Iliac Artery, Ea Add`L Ipsilateral; W/Translumin Stent, W/Angioplasty; Location [facility];   ENDARTERECTOMY FEMORAL Left 12/01/2021   Procedure: ENDARTERECTOMY FEMORAL;  Surgeon: Algernon Huxley, MD;  Location: [facility];  Service: Vascular;  Laterality: Left;   ENDOVASCULAR REPAIR/STENT GRAFT Left 12/01/2021   Procedure: ENDOVASCULAR REPAIR/STENT GRAFT;  Surgeon: Algernon Huxley, MD;  Location: [place] [facility];  Service: Cardiovascular;  Laterality: Left;   INCISION AND DRAINAGE OF WOUND Right 05/03/2022   Procedure: IRRIGATION AND DEBRIDEMENT RIGHT STUMP;  Surgeon: Algernon Huxley, MD;  Location:  [facility];  Service: Vascular;  Laterality: Right;   IRRIGATION AND DEBRIDEMENT ABSCESS Right 08/28/2022   Procedure: AKA IRRIGATION AND DEBRIDEMENT ABSCESS;  Surgeon: Algernon Huxley, MD;  Location: [facility];  Service: Vascular;  Laterality: Right;   LAPAROTOMY N/A 08/08/2017   Procedure: EXPLORATORY LAPAROTOMY POSSIBLE BOWEL RESECTION;  Surgeon: Jules Husbands, MD;  Location: [facility];  Service: General;  Laterality: N/A;   LEFT HEART CATH AND CORONARY ANGIOGRAPHY N/A 05/17/2021   Procedure: LEFT HEART CATH AND CORONARY ANGIOGRAPHY;  Surgeon: Yolonda Kida, MD;  Location: [place] [facility];  Service: Cardiovascular;  Laterality: N/A;   LOWER EXTREMITY ANGIOGRAPHY Left 11/17/2019   Procedure: LOWER EXTREMITY ANGIOGRAPHY;  Surgeon: Algernon Huxley, MD;  Location: [place] [facility];  Service: Cardiovascular;  Laterality: Left;   LOWER EXTREMITY ANGIOGRAPHY Right 12/13/2020   Procedure: LOWER EXTREMITY ANGIOGRAPHY;  Surgeon: Algernon Huxley, MD;  Location: [place]  [facility];  Service: Cardiovascular;  Laterality: Right;   LOWER EXTREMITY ANGIOGRAPHY Right 01/10/2021   Procedure: LOWER EXTREMITY ANGIOGRAPHY;  Surgeon: Algernon Huxley, MD;  Location: [place] [facility];  Service: Cardiovascular;  Laterality: Right;   LOWER EXTREMITY ANGIOGRAPHY Right 01/24/2021   Procedure: LOWER EXTREMITY ANGIOGRAPHY;  Surgeon: Algernon Huxley, MD;  Location: [place] [facility];  Service: Cardiovascular;  Laterality: Right;   LOWER EXTREMITY ANGIOGRAPHY Bilateral 06/28/2021   Procedure: Lower Extremity Angiography;  Surgeon: Algernon Huxley, MD;  Location: [place] [facility];  Service: Cardiovascular;  Laterality: Bilateral;   LOWER EXTREMITY ANGIOGRAPHY Right 11/28/2021   Procedure: LOWER EXTREMITY ANGIOGRAPHY;  Surgeon: Algernon Huxley, MD;  Location: [place] [facility];  Service: Cardiovascular;  Laterality: Right;   LOWER EXTREMITY ANGIOGRAPHY Left 11/30/2021   Procedure: Lower Extremity Angiography;  Surgeon: Algernon Huxley, MD;  Location: [place] [facility];  Service: Cardiovascular;  Laterality: Left;   LOWER EXTREMITY ANGIOGRAPHY Right 02/22/2022   Procedure: Lower Extremity Angiography;  Surgeon: Algernon Huxley, MD;  Location: [place] [facility];  Service: Cardiovascular;  Laterality: Right;   LOWER EXTREMITY ANGIOGRAPHY Right 03/13/2022   Procedure: Lower Extremity Angiography;  Surgeon: Algernon Huxley, MD;  Location: [place] [facility];  Service: Cardiovascular;  Laterality: Right;   LOWER EXTREMITY ANGIOGRAPHY Right 03/24/2022   Procedure: Lower Extremity Angiography;  Surgeon: Algernon Huxley, MD;  Location: [place] [facility];  Service: Cardiovascular;  Laterality: Right;   LOWER EXTREMITY INTERVENTION N/A 11/19/2019   Procedure: LOWER EXTREMITY INTERVENTION;  Surgeon: Algernon Huxley, MD;  Location: [place] [facility];  Service: Cardiovascular;  Laterality: N/A;   LOWER EXTREMITY INTERVENTION Bilateral 06/29/2021   Procedure: LOWER EXTREMITY INTERVENTION;  Surgeon: Algernon Huxley, MD;  Location: [place] [facility];  Service: Cardiovascular;  Laterality: Bilateral;   TEE WITHOUT CARDIOVERSION N/A 06/22/2017   Procedure: TRANSESOPHAGEAL ECHOCARDIOGRAM (TEE);  Surgeon: Wellington Hampshire, MD;  Location: [facility];  Service: Cardiovascular;  Laterality: N/A;   TOTAL KNEE ARTHROPLASTY Right 07/11/2018   Procedure: TOTAL KNEE ARTHROPLASTY;  Surgeon: Corky Mull, MD;  Location: [facility];  Service: Orthopedics;  Laterality: Right;   VAGINAL HYSTERECTOMY     VISCERAL ARTERY INTERVENTION N/A 06/20/2017   Procedure: Visceral Artery Intervention, possible aortic thrombectomy;  Surgeon: Algernon Huxley, MD;  Location: [place] [facility];  Service: Cardiovascular;  Laterality: N/A;   VISCERAL ARTERY INTERVENTION N/A 01/28/2018   Procedure: VISCERAL ARTERY INTERVENTION;  Surgeon: Algernon Huxley, MD;  Location: [place] [facility];  Service: Cardiovascular;  Laterality: N/A;    Social History   Socioeconomic History    Marital status: Single    Spouse name: Not on file   Number of children: Not on file   Years of education: Not on file   Highest education level: Not on file  Occupational History   Occupation: Unemployed  Tobacco Use   Smoking status: Former    Packs/day: 1.00    Types: Cigarettes    Quit date: 2023    Years since quitting: 1.1   Smokeless tobacco: Never  Vaping Use   Vaping Use: Never used  Substance and Sexual Activity   Alcohol use: No   Drug use: No   Sexual activity: Not on file  Other Topics Concern   Not on file  Social History Narrative   Lives in [place] with daughter   Social Determinants of Radio broadcast assistant  Strain: Not on file  Food Insecurity: No Food Insecurity (12/04/2022)   Hunger Vital Sign    Worried About Running Out of Food in the Last Year: Never true    Ran Out of Food in the Last Year: Never true  Transportation Needs: No Transportation Needs (12/04/2022)   PRAPARE - Hydrologist (Medical): No    Lack of Transportation (Non-Medical): No  Physical Activity: Not on file  Stress: Not on file  Social Connections: Not on file  Intimate Partner Violence: Not At Risk (12/04/2022)   Humiliation, Afraid, Rape, and Kick questionnaire    Fear of Current or Ex-Partner: No    Emotionally Abused: No    Physically Abused: No    Sexually Abused: No    Family History  Problem Relation Age of Onset   Hypertension Mother    Heart disease Mother    Heart attack Mother    Breast cancer Mother    Heart disease Father    Hypertension Father    Breast cancer Paternal Aunt    Allergies  Allergen Reactions   Gabapentin     Tremors    Bactrim [Sulfamethoxazole-Trimethoprim] Itching   I? Current Facility-Administered Medications  Medication Dose Route Frequency Provider Last Rate Last Admin   acetaminophen (TYLENOL) tablet 650 mg  650 mg Oral Q6H PRN Algernon Huxley, MD       Or   acetaminophen (TYLENOL) suppository  650 mg  650 mg Rectal Q6H PRN Algernon Huxley, MD       ALPRAZolam Duanne Moron) tablet 0.5 mg  0.5 mg Oral BID PRN Algernon Huxley, MD   0.5 mg at 12/07/22 1056   alum & mag hydroxide-simeth (MAALOX/MYLANTA) 200-200-20 MG/5ML suspension 15-30 mL  15-30 mL Oral Q2H PRN Algernon Huxley, MD       atorvastatin (LIPITOR) tablet 80 mg  80 mg Oral QHS Algernon Huxley, MD   80 mg at 12/06/22 2103   bisacodyl (DULCOLAX) EC tablet 5 mg  5 mg Oral Daily PRN Algernon Huxley, MD       Chlorhexidine Gluconate Cloth 2 % PADS 6 each  6 each Topical Q0600 Algernon Huxley, MD   6 each at 12/07/22 0515   fentaNYL (SUBLIMAZE) injection 12.5 mcg  12.5 mcg Intravenous Once PRN Annalee Genta R, NP       ferrous sulfate tablet 325 mg  325 mg Oral Q breakfast Algernon Huxley, MD   325 mg at 12/06/22 0901   heparin ADULT infusion 100 units/mL (25000 units/22m)  1,550 Units/hr Intravenous Continuous POswald Hillock RPH       HYDROcodone-acetaminophen (NORCO) 7.5-325 MG per tablet 1 tablet  1 tablet Oral Q4H PRN DAlgernon Huxley MD   1 tablet at 12/07/22 1306   ipratropium-albuterol (DUONEB) 0.5-2.5 (3) MG/3ML nebulizer solution 3 mL  3 mL Nebulization Q6H PRN DAlgernon Huxley MD       isosorbide mononitrate (IMDUR) 24 hr tablet 60 mg  60 mg Oral Daily DAlgernon Huxley MD   60 mg at 12/07/22 1110   lidocaine (LIDODERM) 5 % 1 patch  1 patch Transdermal Q24H DAlgernon Huxley MD   1 patch at 12/07/22 1113   meropenem (MERREM) 1 g in sodium chloride 0.9 % 100 mL IVPB  1 g Intravenous Q8H DAlgernon Huxley MD 200 mL/hr at 12/07/22 1308 1 g at 12/07/22 1308   metoprolol tartrate (LOPRESSOR) tablet 12.5 mg  12.5 mg  Oral BID Algernon Huxley, MD   12.5 mg at 12/07/22 1110   morphine (MS CONTIN) 12 hr tablet 15 mg  15 mg Oral Q12H Algernon Huxley, MD   15 mg at 12/07/22 1111   multivitamin with minerals tablet 1 tablet  1 tablet Oral Daily Algernon Huxley, MD   1 tablet at 12/07/22 1110   mupirocin ointment (BACTROBAN) 2 % 1 Application  1 Application Nasal BID Algernon Huxley, MD   1  Application at [id number]   ondansetron (ZOFRAN) injection 4 mg  4 mg Intravenous Q6H PRN Pace, Brien R, NP       ondansetron (ZOFRAN) tablet 4 mg  4 mg Oral Q8H PRN Algernon Huxley, MD       Oral care mouth rinse  15 mL Mouth Rinse PRN Lucky Cowboy, Erskine Squibb, MD       pantoprazole (PROTONIX) EC tablet 40 mg  40 mg Oral Daily Algernon Huxley, MD   40 mg at 12/07/22 1110   pregabalin (LYRICA) capsule 200 mg  200 mg Oral TID Algernon Huxley, MD   200 mg at 12/07/22 1110     Abtx:  Anti-infectives (From admission, onward)    Start     Dose/Rate Route Frequency Ordered Stop   12/07/22 0833  ceFAZolin (ANCEF) 2-4 GM/100ML-% IVPB       Note to Pharmacy: Jeanene Erb E: cabinet override      12/07/22 0833 12/07/22 0911   12/06/22 2212  ceFAZolin (ANCEF) IVPB 2g/100 mL premix        2 g 200 mL/hr over 30 Minutes Intravenous 30 min pre-op 12/06/22 2212 12/07/22 0847   12/06/22 2200  meropenem (MERREM) 1 g in sodium chloride 0.9 % 100 mL IVPB        1 g 200 mL/hr over 30 Minutes Intravenous Every 8 hours 12/06/22 1634     12/04/22 1000  vancomycin (VANCOREADY) IVPB 1250 mg/250 mL  Status:  Discontinued        1,250 mg 166.7 mL/hr over 90 Minutes Intravenous Every 24 hours 12/03/22 0802 12/06/22 1634   12/03/22 1400  piperacillin-tazobactam (ZOSYN) IVPB 3.375 g        3.375 g 12.5 mL/hr over 240 Minutes Intravenous Every 8 hours 12/03/22 0801 12/06/22 2358   12/03/22 0630  piperacillin-tazobactam (ZOSYN) IVPB 3.375 g        3.375 g 100 mL/hr over 30 Minutes Intravenous  Once 12/03/22 0623 12/03/22 0715   12/03/22 0630  vancomycin (VANCOREADY) IVPB 2000 mg/400 mL        2,000 mg 200 mL/hr over 120 Minutes Intravenous  Once 12/03/22 0623 12/03/22 0916       REVIEW OF SYSTEMS:  Const: negative fever, negative chills, negative weight loss Eyes: negative diplopia or visual changes, negative eye pain ENT: negative coryza, negative sore throat Resp: negative cough, hemoptysis, dyspnea Cards: negative for  chest pain, palpitations, lower extremity edema GU: negative for frequency, dysuria and hematuria GI: Negative for abdominal pain, diarrhea, bleeding, constipation Skin: negative for rash and pruritus Heme: negative for easy bruising and gum/nose bleeding MS: weakness Pain rt stump Neurolo:negative for headaches, dizziness, vertigo, memory problems  Psych: anxiety, depression  Endocrine:  diabetes Allergy/Immunology- bactrim and gabapentin Objective:  VITALS:  BP (!) 146/75 (BP Location: Right Arm)   Pulse 91   Temp 98 F (36.7 C)   Resp (!) 22   Ht 5' 2"$  (1.575 m)   Wt 99.8 kg  SpO2 97%   BMI 40.24 kg/m   PHYSICAL EXAM:  General:Somnolent ( just back from surgery)t, cooperative, no distress, pale Head: Normocephalic, without obvious abnormality, atraumatic. Eyes: Conjunctivae clear, anicteric sclerae. Pupils are equal ENT Nares normal. No drainage or sinus tenderness. Lips, mucosa, and tongue normal. No Thrush Neck: Supple, symmetrical, no adenopathy, thyroid: non tender no carotid bruit and no JVD. Lungs: Clear to auscultation bilaterally. No Wheezing or Rhonchi. No rales. Heart: irregular Abdomen: Soft, non-tender,not distended. Bowel sounds normal. No masses Extremities: Rt AKA- wound vac present Skin: No rashes or lesions. Or bruising Lymph: Cervical, supraclavicular normal. Neurologic: Grossly non-focal Pertinent Labs Lab Results CBC    Component Value Date/Time   WBC 6.4 12/05/2022 0451   RBC 3.93 12/05/2022 0451   HGB 10.3 (L) 12/05/2022 0451   HGB 13.4 02/12/2015 1137   HCT 33.9 (L) 12/05/2022 0451   HCT 40.9 02/12/2015 1137   PLT 198 12/05/2022 0451   PLT 272 02/12/2015 1137   MCV 86.3 12/05/2022 0451   MCV 90 02/12/2015 1137   MCH 26.2 12/05/2022 0451   MCHC 30.4 12/05/2022 0451   RDW 17.3 (H) 12/05/2022 0451   RDW 14.8 (H) 02/12/2015 1137   LYMPHSABS 2.7 12/03/2022 0359   LYMPHSABS 1.7 08/27/2014 1145   MONOABS 0.8 12/03/2022 0359   MONOABS  0.4 08/27/2014 1145   EOSABS 0.1 12/03/2022 0359   EOSABS 0.0 08/27/2014 1145   BASOSABS 0.1 12/03/2022 0359   BASOSABS 0.0 08/27/2014 1145       Latest Ref Rng & Units 12/07/2022    5:24 AM 12/06/2022    3:09 AM 12/05/2022    4:51 AM  CMP  Glucose 70 - 99 mg/dL   111   BUN 8 - 23 mg/dL   10   Creatinine 0.44 - 1.00 mg/dL 0.61  0.80  0.78   Sodium 135 - 145 mmol/L   139   Potassium 3.5 - 5.1 mmol/L   3.9   Chloride 98 - 111 mmol/L   112   CO2 22 - 32 mmol/L   20   Calcium 8.9 - 10.3 mg/dL   8.0       Microbiology: Recent Results (from the past 240 hour(s))  Resp panel by RT-PCR (RSV, Flu A&B, Covid) Anterior Nasal Swab     Status: None   Collection Time: 12/03/22  3:59 AM   Specimen: Anterior Nasal Swab  Result Value Ref Range Status   SARS Coronavirus 2 by RT PCR NEGATIVE NEGATIVE Final    Comment: (NOTE) SARS-CoV-2 target nucleic acids are NOT DETECTED.  The SARS-CoV-2 RNA is generally detectable in upper respiratory specimens during the acute phase of infection. The lowest concentration of SARS-CoV-2 viral copies this assay can detect is 138 copies/mL. A negative result does not preclude SARS-Cov-2 infection and should not be used as the sole basis for treatment or other patient management decisions. A negative result may occur with  improper specimen collection/handling, submission of specimen other than nasopharyngeal swab, presence of viral mutation(s) within the areas targeted by this assay, and inadequate number of viral copies(<138 copies/mL). A negative result must be combined with clinical observations, patient history, and epidemiological information. The expected result is Negative.  Fact Sheet for Patients:  EntrepreneurPulse.com.au  Fact Sheet for Healthcare Providers:  IncredibleEmployment.be  This test is no t yet approved or cleared by the [place] FDA and  has been authorized for detection and/or diagnosis  of SARS-CoV-2 by FDA under an Emergency Use Authorization (  EUA). This EUA will remain  in effect (meaning this test can be used) for the duration of the COVID-19 declaration under Section 564(b)(1) of the Act, 21 U.S.C.section 360bbb-3(b)(1), unless the authorization is terminated  or revoked sooner.       Influenza A by PCR NEGATIVE NEGATIVE Final   Influenza B by PCR NEGATIVE NEGATIVE Final    Comment: (NOTE) The Xpert Xpress SARS-CoV-2/FLU/RSV plus assay is intended as an aid in the diagnosis of influenza from Nasopharyngeal swab specimens and should not be used as a sole basis for treatment. Nasal washings and aspirates are unacceptable for Xpert Xpress SARS-CoV-2/FLU/RSV testing.  Fact Sheet for Patients: EntrepreneurPulse.com.au  Fact Sheet for Healthcare Providers: IncredibleEmployment.be  This test is not yet approved or cleared by the [place] FDA and has been authorized for detection and/or diagnosis of SARS-CoV-2 by FDA under an Emergency Use Authorization (EUA). This EUA will remain in effect (meaning this test can be used) for the duration of the COVID-19 declaration under Section 564(b)(1) of the Act, 21 U.S.C. section 360bbb-3(b)(1), unless the authorization is terminated or revoked.     Resp Syncytial Virus by PCR NEGATIVE NEGATIVE Final    Comment: (NOTE) Fact Sheet for Patients: EntrepreneurPulse.com.au  Fact Sheet for Healthcare Providers: IncredibleEmployment.be  This test is not yet approved or cleared by the [place] FDA and has been authorized for detection and/or diagnosis of SARS-CoV-2 by FDA under an Emergency Use Authorization (EUA). This EUA will remain in effect (meaning this test can be used) for the duration of the COVID-19 declaration under Section 564(b)(1) of the Act, 21 U.S.C. section 360bbb-3(b)(1), unless the authorization is terminated  or revoked.  Performed at [facility], [address] [place], [place] [address]   Aerobic/Anaerobic Culture w Gram Stain (surgical/deep wound)     Status: None (Preliminary result)   Collection Time: 12/03/22  9:56 AM   Specimen: Abscess; Wound  Result Value Ref Range Status   Specimen Description   Final    ABSCESS Performed at [facility], [address], [place], [place] [address]    Special Requests   Final    Normal Performed at [facility], [address] [place], [place] [address]    Gram Stain   Final    MODERATE WBC PRESENT, PREDOMINANTLY PMN NO ORGANISMS SEEN Performed at [facility], [place] [address] [place], [place] [address]    Culture   Final    RARE ESCHERICHIA COLI Confirmed Extended Spectrum Beta-Lactamase Producer (ESBL).  In bloodstream infections from ESBL organisms, carbapenems are preferred over piperacillin/tazobactam. They are shown to have a lower risk of mortality. RARE ENTEROCOCCUS FAECALIS NO ANAEROBES ISOLATED; CULTURE IN PROGRESS FOR 5 DAYS    Report Status PENDING  Incomplete   Organism ID, Bacteria ESCHERICHIA COLI  Final   Organism ID, Bacteria ENTEROCOCCUS FAECALIS  Final      Susceptibility   Escherichia coli - MIC*    AMPICILLIN >=32 RESISTANT Resistant     CEFEPIME >=32 RESISTANT Resistant     CEFTAZIDIME RESISTANT Resistant     CEFTRIAXONE >=64 RESISTANT Resistant     CIPROFLOXACIN >=4 RESISTANT Resistant     GENTAMICIN <=1 SENSITIVE Sensitive     IMIPENEM <=0.25 SENSITIVE Sensitive     TRIMETH/SULFA >=320 RESISTANT Resistant     AMPICILLIN/SULBACTAM 16 INTERMEDIATE Intermediate     PIP/TAZO <=4 SENSITIVE Sensitive     * RARE ESCHERICHIA COLI   Enterococcus faecalis - MIC*  AMPICILLIN <=2 SENSITIVE Sensitive     VANCOMYCIN 1 SENSITIVE Sensitive     GENTAMICIN SYNERGY SENSITIVE Sensitive     * RARE ENTEROCOCCUS FAECALIS  Surgical PCR screen     Status: Abnormal   Collection Time:  12/07/22  2:38 AM   Specimen: Nasal Mucosa; Nasal Swab  Result Value Ref Range Status   MRSA, PCR POSITIVE (A) NEGATIVE Final    Comment: CRITICAL RESULT CALLED TO, READ BACK BY AND VERIFIED WITH: ERWIN MACROHON RN @0512$  12/07/22 ASW    Staphylococcus aureus POSITIVE (A) NEGATIVE Final    Comment: CRITICAL RESULT CALLED TO, READ BACK BY AND VERIFIED WITH: Erlene Quan RN @0512$  12/07/22 ASW (NOTE) The Xpert SA Assay (FDA approved for NASAL specimens in patients 61 years of age and older), is one component of a comprehensive surveillance program. It is not intended to diagnose infection nor to guide or monitor treatment. Performed at [facility], [address] [place], [place] [address]     IMAGING RESULTS: CT femur rt reviewed Tracking absecc at the stump site with osteo I have personally reviewed the films ? Impression/Recommendation ?Infected Rt AKA stump wound S/p revision ESBL E.coli infection On Meropenem Will need a long course  Acute osteomyelitis of Rt femur  PAD with h/o thrombectomy, Bypass  Paroxysmal Afib   HLD on statin   ? ___________________________________________________ Discussed with patient, requesting provider Note:  This document was prepared using Dragon voice recognition software and may include unintentional dictation errors.

## 2022-12-07 NOTE — Interval H&P Note (Signed)
History and Physical Interval Note:  12/07/2022 8:16 AM  Carla [name]  has presented today for surgery, with the diagnosis of post AKA.  The various methods of treatment have been discussed with the patient and family. After consideration of risks, benefits and other options for treatment, the patient has consented to  Procedure(s): AMPUTATION ABOVE KNEE-Revision (Right) as a surgical intervention.  The patient's history has been reviewed, patient examined, no change in status, stable for surgery.  I have reviewed the patient's chart and labs.  Questions were answered to the patient's satisfaction.  If we open the area and no infection is seen, would plan drainage for blood/fluid but if it is clearly infected, will likely need amputation at a higher level.   [name]

## 2022-12-07 NOTE — Anesthesia Procedure Notes (Signed)
Procedure Name: Intubation Date/Time: 12/07/2022 8:45 AM  Performed by: Kelton Pillar, CRNAPre-anesthesia Checklist: Patient identified, Emergency Drugs available, Suction available and Patient being monitored Patient Re-evaluated:Patient Re-evaluated prior to induction Oxygen Delivery Method: Circle system utilized Preoxygenation: Pre-oxygenation with 100% oxygen Induction Type: IV induction Ventilation: Mask ventilation without difficulty Laryngoscope Size: McGraph and 3 Grade View: Grade I Tube type: Oral Tube size: 7.0 mm Number of attempts: 1 Airway Equipment and Method: Stylet and Oral airway Placement Confirmation: ETT inserted through vocal cords under direct vision, positive ETCO2, breath sounds checked- equal and bilateral and CO2 detector Secured at: 21 cm Tube secured with: Tape Dental Injury: Teeth and Oropharynx as per pre-operative assessment

## 2022-12-07 NOTE — Consult Note (Signed)
ANTICOAGULATION CONSULT NOTE  Pharmacy Consult for IV Heparin Indication: atrial fibrillation and Hx DVT   Patient Measurements: Height: 5' 2"$  (157.5 cm) Weight: 99.8 kg (220 lb) IBW/kg (Calculated) : 50.1 Heparin Dosing Weight: 73.8 kg  Labs: Recent Labs    12/05/22 0451 12/05/22 1146 12/06/22 0309 12/06/22 1012 12/06/22 2104 12/07/22 0524  HGB 10.3*  --   --   --   --   --   HCT 33.9*  --   --   --   --   --   PLT 198  --   --   --   --   --   APTT 107*   < > 64* 63* 65* 82*  HEPARINUNFRC 0.79*  --  0.41  --   --  0.53  CREATININE 0.78  --  0.80  --   --  0.61   < > = values in this interval not displayed.     Estimated Creatinine Clearance: 79.5 mL/min (by C-G formula based on SCr of 0.61 mg/dL).   Medical History: Past Medical History:  Diagnosis Date   Abdominal aortic atherosclerosis ([place])    a. 05/2017 CTA abd/pelvis: significant atherosclerotic dzs of the infrarenal abd Ao w/ some mural thrombus. No aneurysm or dissection.   Acute focal ischemia of small intestine (HCC)    Acute right-sided low back pain with right-sided sciatica 06/13/2017   AKI (acute kidney injury) ([place]) 07/29/2017   Anemia    Anginal pain ([place])    a. 08/2012 Lexiscan MV: EF 54%, non ischemia/infarct.   Anxiety and depression    Baker's cyst of knee, right    a. 07/2016 U/S: 4.1 x 1.4 x 2.9 cystic structure in R poplitetal fossa.   Bell's palsy    CAD (coronary artery disease)    CHF (congestive heart failure) ([place])    a.) TTE 06/20/2017: EF 60-65%, no rwma, G1DD, no source of cardiac emboli.   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 05/2017 CTA Abd/pelvis: apparent thrombus or embolus in prox SMA (70-90%); b. 05/2017 catheter directed tPA, mechanical thrombectomy, and stenting of  the SMA.   Essential hypertension with goal blood pressure less than 140/90 01/19/2016   Gastroesophageal reflux disease 01/19/2016   GERD (gastroesophageal reflux disease)    H/O colectomy    History of 2019 novel coronavirus disease (COVID-19) 11/14/2019   History of kidney stones    Hyperlipidemia    Hypertension    Morbid obesity with BMI of 40.0-44.9, adult ([place])    NSTEMI (non-ST elevated myocardial infarction) ([place]) 06/27/2021   a.) high sensitivity troponins trended: 893 --> 1587 --> 1748 --> 868 --> 735 ng/L   Occlusive mesenteric ischemia ([place]) 06/19/2017   Osteoarthritis    PAD (peripheral artery disease) (HCC)    PAF (paroxysmal atrial fibrillation) (HCC)    a.) CHA2DS2-VASc = 6 (sex, CHF, HTN, DVT x2, aortic plaque). b.) rate/rhythm maintained on oral metoprolol tartrate; chronically anticoagulated with standard dose apixaban.   Palpitations    Peripheral edema    Personal history of other malignant neoplasm of skin 06/01/2016   Pneumonia    Pre-diabetes    Primary osteoarthritis of both knees 06/13/2017   SBO (small bowel obstruction) ([place]) 08/06/2017   Skin  cancer of nose    Superior mesenteric artery thrombosis (HCC)    Vitamin D deficiency     Medications:  Apixaban 5 mg BID (last patient reported dose was 2/10 PM)  Assessment: Patient is a 62 y/o F with medical history as above and including Afib and Hx DVT on apixaban who is admitted with Rt AKA stump infection / OM. Patient may need surgical intervention. Pharmacy consulted to initiate and mange heparin infusion while apixaban is on hold.   Baseline aPTT, PT-INR and, heparin level are pending. Baseline H&H, platelets within normal limits.  Goal of Therapy:  Heparin level 0.3-0.7 units/ml aPTT 66 - 102 seconds Monitor platelets by anticoagulation protocol: Yes   Plan:   2/14 @ 0309:  aPTT = 64 ,  HL = 0.41 2/15 @ 0524:  aPTT = 82,   HL = 0.53 - aPTT therapeutic X 1 , now correlating with HL  - Will  use HL to guide dosing from now on - Will draw confirmation HL on 2/15 @ 1100. --Daily CBC per protocol while on IV heparin  Majd Tissue D 12/07/2022 6:46 AM

## 2022-12-07 NOTE — Anesthesia Preprocedure Evaluation (Signed)
Anesthesia Evaluation  Patient identified by MRN, date of birth, ID band Patient awake    Reviewed: Allergy & Precautions, NPO status , Patient's Chart, lab work & pertinent test results  Airway Mallampati: III  TM Distance: >3 FB Neck ROM: full    Dental  (+) Edentulous Upper, Edentulous Lower   Pulmonary neg pulmonary ROS, shortness of breath, COPD,  COPD inhaler, former smoker   Pulmonary exam normal  + decreased breath sounds      Cardiovascular Exercise Tolerance: Poor hypertension, Pt. on medications + angina  + CAD, + Past MI, + Peripheral Vascular Disease and +CHF  negative cardio ROS Normal cardiovascular exam Rhythm:Regular     Neuro/Psych   Anxiety     negative neurological ROS  negative psych ROS   GI/Hepatic negative GI ROS, Neg liver ROS,GERD  Medicated,,  Endo/Other  negative endocrine ROS  Morbid obesity  Renal/GU Renal InsufficiencyRenal disease     Musculoskeletal   Abdominal  (+) + obese  Peds negative pediatric ROS (+)  Hematology negative hematology ROS (+)   Anesthesia Other Findings Past Medical History: No date: Abdominal aortic atherosclerosis ([place])     Comment:  a. 05/2017 CTA abd/pelvis: significant atherosclerotic               dzs of the infrarenal abd Ao w/ some mural thrombus. No               aneurysm or dissection. No date: Acute focal ischemia of small intestine ([place]) 06/13/2017: Acute right-sided low back pain with right-sided sciatica 07/29/2017: AKI (acute kidney injury) ([place]) No date: Anemia No date: Anginal pain ([place])     Comment:  a. 08/2012 Lexiscan MV: EF 54%, non ischemia/infarct. No date: Anxiety and depression No date: Baker's cyst of knee, right     Comment:  a. 07/2016 U/S: 4.1 x 1.4 x 2.9 cystic structure in R               poplitetal fossa. No date: Bell's palsy No date: CAD (coronary artery disease) No date: CHF (congestive heart failure) ([place])     Comment:   a.) TTE 06/20/2017: EF 60-65%, no rwma, G1DD, no source               of cardiac emboli. 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 05/2017 CTA Abd/pelvis: apparent thrombus or embolus               in prox SMA (70-90%); b. 05/2017 catheter directed tPA,               mechanical thrombectomy, and stenting of the SMA. 01/19/2016: Essential hypertension with goal blood pressure less than  140/90 01/19/2016: Gastroesophageal reflux disease No date: GERD (gastroesophageal reflux disease) No date: H/O colectomy 11/14/2019: History of 2019 novel coronavirus disease (COVID-19) No date: History of kidney stones No date: Hyperlipidemia No date: Hypertension No date: Morbid obesity with BMI of 40.0-44.9, adult ([place]) 06/27/2021: NSTEMI (non-ST elevated myocardial infarction) ([place])     Comment:  a.) high sensitivity troponins trended: 893 --> 1587 -->              1748 --> 868 --> 735 ng/L 06/19/2017: Occlusive mesenteric ischemia (HCC) No date:  Osteoarthritis No date: PAD (peripheral artery disease) (HCC) No date: PAF (paroxysmal atrial fibrillation) (HCC)     Comment:  a.) CHA2DS2-VASc = 6 (sex, CHF, HTN, DVT x2, aortic               plaque). b.) rate/rhythm maintained on oral metoprolol               tartrate; chronically anticoagulated with standard dose               apixaban. No date: Palpitations No date: Peripheral edema 06/01/2016: Personal history of other malignant neoplasm of skin No date: Pneumonia No date: Pre-diabetes 06/13/2017: Primary osteoarthritis of both knees 08/06/2017: SBO (small bowel obstruction) (HCC) No date: Skin cancer of nose No date: Superior mesenteric artery thrombosis (HCC) No date:  Vitamin D deficiency  Past Surgical History: 04/06/2022: AMPUTATION; Right     Comment:  Procedure: AMPUTATION ABOVE KNEE;  Surgeon: Algernon Huxley, MD;  Location: [facility];  Service: Vascular;                Laterality: Right; 12/09/2019: AMPUTATION TOE; Left     Comment:  Procedure: AMPUTATION TOE MPJ left;  Surgeon: Caroline More, DPM;  Location: [facility];  Service: Podiatry;                Laterality: Left; No date: APPENDECTOMY 99991111: APPLICATION OF WOUND VAC     Comment:  Procedure: APPLICATION OF WOUND VAC;  Surgeon: Algernon Huxley, MD;  Location: [facility];  Service: Vascular;; [id number]: APPLICATION OF WOUND VAC; Right     Comment:  Procedure: APPLICATION OF WOUND VAC;  Surgeon: Algernon Huxley, MD;  Location: [facility];  Service: Vascular;                Laterality: Right; [id number]: APPLICATION OF WOUND VAC; Left     Comment:  Procedure: APPLICATION OF WOUND VAC;  Surgeon: Serafina Mitchell, MD;  Location: [facility];  Service: Vascular;                Laterality: Left;  [place] [id number]: APPLICATION OF WOUND VAC; Right     Comment:  Procedure: APPLICATION OF WOUND VAC;  Surgeon: Algernon Huxley, MD;  Location: [facility];  Service: Vascular;                Laterality: Right; 07/23/2022: AXILLARY-FEMORAL BYPASS GRAFT; Left     Comment:  Procedure: BYPASS GRAFT AXILLA-BIFEMORAL;  Surgeon:               Serafina Mitchell, MD;  Location: [facility];  Service:               Vascular;  Laterality: Left; No date: CHOLECYSTECTOMY No date: COLON SURGERY 03/23/2022: COLONOSCOPY WITH PROPOFOL; N/A     Comment:  Procedure: COLONOSCOPY WITH PROPOFOL;  Surgeon: Jonathon Bellows, MD;  Location: [place];  Service:  Gastroenterology;  Laterality: N/A;  rectal bleed 06/21/2020: EMBOLECTOMY OR THROMBECTOMY, WITH OR WITHOUT CATHETER;  FEMOROPOPLITEAL, AORTOILIAC ARTERY, BY LEG INCISION; N/A      Comment:  Left - Decomp Fasciotomy Leg; Ant &/Or Lat Compart Only;              Midline - Angiography, Visceral, Selective Or               Supraselective (With Or Without Flush Aortogram); Left -               Revascularize, Endovasc, Open/Percut, Iliac Artery,               Unilat, Initial Vessel; W/Translum Stent, W/Angioplasty;               Right - Revascularize, Endovasc, Open/Percut, Iliac               Artery, Ea Add`L Ipsilateral; W/Translumin Stent,               W/Angioplasty; Location [facility]; 12/01/2021: ENDARTERECTOMY FEMORAL; Left     Comment:  Procedure: ENDARTERECTOMY FEMORAL;  Surgeon: Algernon Huxley, MD;  Location: [facility];  Service: Vascular;                Laterality: Left; 12/01/2021: ENDOVASCULAR REPAIR/STENT GRAFT; Left     Comment:  Procedure: ENDOVASCULAR REPAIR/STENT GRAFT;  Surgeon:               Algernon Huxley, MD;  Location: [place] [facility];                Service: Cardiovascular;  Laterality: Left; 05/03/2022: INCISION AND DRAINAGE OF WOUND; Right     Comment:  Procedure: IRRIGATION AND DEBRIDEMENT RIGHT STUMP;                Surgeon: Algernon Huxley, MD;  Location: [facility];  Service:              Vascular;  Laterality: Right; 08/28/2022: IRRIGATION AND DEBRIDEMENT ABSCESS; Right     Comment:  Procedure: AKA IRRIGATION AND DEBRIDEMENT ABSCESS;                Surgeon: Algernon Huxley, MD;  Location: [facility];  Service:              Vascular;  Laterality: Right; 08/08/2017: LAPAROTOMY; N/A     Comment:  Procedure: EXPLORATORY LAPAROTOMY POSSIBLE BOWEL               RESECTION;  Surgeon: Jules Husbands, MD;  Location: [facility];  Service: General;  Laterality: N/A; 05/17/2021: LEFT HEART CATH AND CORONARY ANGIOGRAPHY; N/A     Comment:  Procedure: LEFT HEART CATH AND CORONARY ANGIOGRAPHY;                Surgeon: Yolonda Kida, MD;  Location: [place]              [facility];  Service: Cardiovascular;  Laterality: N/A; 11/17/2019: LOWER  EXTREMITY ANGIOGRAPHY; Left     Comment:  Procedure: LOWER EXTREMITY ANGIOGRAPHY;  Surgeon: Algernon Huxley, MD;  Location: [place] [facility];  Service:  Cardiovascular;  Laterality: Left; 12/13/2020: LOWER EXTREMITY ANGIOGRAPHY; Right     Comment:  Procedure: LOWER EXTREMITY ANGIOGRAPHY;  Surgeon: Algernon Huxley, MD;  Location: [place] [facility];  Service:               Cardiovascular;  Laterality: Right; 01/10/2021: LOWER EXTREMITY ANGIOGRAPHY; Right     Comment:  Procedure: LOWER EXTREMITY ANGIOGRAPHY;  Surgeon: Algernon Huxley, MD;  Location: [place] [facility];  Service:               Cardiovascular;  Laterality: Right; 01/24/2021: LOWER EXTREMITY ANGIOGRAPHY; Right     Comment:  Procedure: LOWER EXTREMITY ANGIOGRAPHY;  Surgeon: Algernon Huxley, MD;  Location: [place] [facility];  Service:               Cardiovascular;  Laterality: Right; 06/28/2021: LOWER EXTREMITY ANGIOGRAPHY; Bilateral     Comment:  Procedure: Lower Extremity Angiography;  Surgeon: Algernon Huxley, MD;  Location: [place] [facility];  Service:               Cardiovascular;  Laterality: Bilateral; 11/28/2021: LOWER EXTREMITY ANGIOGRAPHY; Right     Comment:  Procedure: LOWER EXTREMITY ANGIOGRAPHY;  Surgeon: Algernon Huxley, MD;  Location: [place] [facility];  Service:               Cardiovascular;  Laterality: Right; 11/30/2021: LOWER EXTREMITY ANGIOGRAPHY; Left     Comment:  Procedure: Lower Extremity Angiography;  Surgeon: Algernon Huxley, MD;  Location: [place] [facility];  Service:               Cardiovascular;  Laterality: Left; 02/22/2022: LOWER EXTREMITY ANGIOGRAPHY; Right     Comment:  Procedure: Lower Extremity Angiography;  Surgeon: Algernon Huxley, MD;  Location: [place] [facility];  Service:               Cardiovascular;  Laterality: Right; 03/13/2022: LOWER EXTREMITY ANGIOGRAPHY; Right      Comment:  Procedure: Lower Extremity Angiography;  Surgeon: Algernon Huxley, MD;  Location: [place] [facility];  Service:               Cardiovascular;  Laterality: Right; 03/24/2022: LOWER EXTREMITY ANGIOGRAPHY; Right     Comment:  Procedure: Lower Extremity Angiography;  Surgeon: Algernon Huxley, MD;  Location: [place] [facility];  Service:               Cardiovascular;  Laterality: Right; 11/19/2019: LOWER EXTREMITY INTERVENTION; N/A     Comment:  Procedure: LOWER EXTREMITY INTERVENTION;  Surgeon: Algernon Huxley, MD;  Location: [place] [facility];  Service:  Cardiovascular;  Laterality: N/A; 06/29/2021: LOWER EXTREMITY INTERVENTION; Bilateral     Comment:  Procedure: LOWER EXTREMITY INTERVENTION;  Surgeon: Algernon Huxley, MD;  Location: [place] [facility];  Service:               Cardiovascular;  Laterality: Bilateral; 06/22/2017: TEE WITHOUT CARDIOVERSION; N/A     Comment:  Procedure: TRANSESOPHAGEAL ECHOCARDIOGRAM (TEE);                Surgeon: Wellington Hampshire, MD;  Location: [facility];                Service: Cardiovascular;  Laterality: N/A; 07/11/2018: TOTAL KNEE ARTHROPLASTY; Right     Comment:  Procedure: TOTAL KNEE ARTHROPLASTY;  Surgeon: Corky Mull, MD;  Location: [facility];  Service: Orthopedics;                Laterality: Right; No date: VAGINAL HYSTERECTOMY 06/20/2017: VISCERAL ARTERY INTERVENTION; N/A     Comment:  Procedure: Visceral Artery Intervention, possible aortic              thrombectomy;  Surgeon: Algernon Huxley, MD;  Location: [place] [facility];  Service: Cardiovascular;  Laterality:               N/A; 01/28/2018: VISCERAL ARTERY INTERVENTION; N/A     Comment:  Procedure: VISCERAL ARTERY INTERVENTION;  Surgeon: Algernon Huxley, MD;  Location: [place] [facility];  Service:               Cardiovascular;  Laterality: N/A;  BMI    Body Mass Index: 40.24  kg/m      Reproductive/Obstetrics negative OB ROS                             Anesthesia Physical Anesthesia Plan  ASA: 3  Anesthesia Plan: General   Post-op Pain Management:    Induction: Intravenous  PONV Risk Score and Plan: Ondansetron, Dexamethasone, Midazolam and Treatment may vary due to age or medical condition  Airway Management Planned: Oral ETT  Additional Equipment:   Intra-op Plan:   Post-operative Plan: Extubation in OR  Informed Consent: I have reviewed the patients History and Physical, chart, labs and discussed the procedure including the risks, benefits and alternatives for the proposed anesthesia with the patient or authorized representative who has indicated his/her understanding and acceptance.     Dental Advisory Given  Plan Discussed with: CRNA and Surgeon  Anesthesia Plan Comments:        Anesthesia Quick Evaluation

## 2022-12-08 DIAGNOSIS — T8740 Infection of amputation stump, unspecified extremity: Secondary | ICD-10-CM | POA: Diagnosis not present

## 2022-12-08 DIAGNOSIS — M86151 Other acute osteomyelitis, right femur: Secondary | ICD-10-CM | POA: Diagnosis not present

## 2022-12-08 LAB — BASIC METABOLIC PANEL
Anion gap: 10 (ref 5–15)
BUN: 12 mg/dL (ref 8–23)
CO2: 20 mmol/L — ABNORMAL LOW (ref 22–32)
Calcium: 8.1 mg/dL — ABNORMAL LOW (ref 8.9–10.3)
Chloride: 103 mmol/L (ref 98–111)
Creatinine, Ser: 0.59 mg/dL (ref 0.44–1.00)
GFR, Estimated: >60 mL/min (ref >60)
Glucose, Bld: 196 mg/dL — ABNORMAL HIGH (ref 70–99)
Potassium: 4.2 mmol/L (ref 3.5–5.1)
Sodium: 133 mmol/L — ABNORMAL LOW (ref 135–145)

## 2022-12-08 LAB — CBC
HCT: 31.1 % — ABNORMAL LOW (ref 36.0–46.0)
Hemoglobin: 9.6 g/dL — ABNORMAL LOW (ref 12.0–15.0)
MCH: 26.9 pg (ref 26.0–34.0)
MCHC: 30.9 g/dL (ref 30.0–36.0)
MCV: 87.1 fL (ref 80.0–100.0)
Platelets: 202 10*3/uL (ref 150–400)
RBC: 3.57 MIL/uL — ABNORMAL LOW (ref 3.87–5.11)
RDW: 18.2 % — ABNORMAL HIGH (ref 11.5–15.5)
WBC: 9.7 10*3/uL (ref 4.0–10.5)
nRBC: 0 % (ref 0.0–0.2)

## 2022-12-08 LAB — PHOSPHORUS: Phosphorus: 2.7 mg/dL (ref 2.5–4.6)

## 2022-12-08 LAB — HEPARIN LEVEL (UNFRACTIONATED)
Heparin Unfractionated: 0.43 IU/mL (ref 0.30–0.70)
Heparin Unfractionated: 0.5 IU/mL (ref 0.30–0.70)

## 2022-12-08 LAB — MAGNESIUM: Magnesium: 2.1 mg/dL (ref 1.7–2.4)

## 2022-12-08 MED ORDER — MORPHINE SULFATE (PF) 2 MG/ML IV SOLN
1.0000 mg | Freq: Four times a day (QID) | INTRAVENOUS | Status: DC | PRN
  Administered 2022-12-08 – 2022-12-11 (×9): 1 mg via INTRAVENOUS
  Filled 2022-12-08 (×9): qty 1

## 2022-12-08 NOTE — Consult Note (Signed)
ANTICOAGULATION CONSULT NOTE  Pharmacy Consult for IV Heparin Indication: atrial fibrillation and Hx DVT   Patient Measurements: Height: 5' 2"$  (157.5 cm) Weight: 99.8 kg (220 lb) IBW/kg (Calculated) : 50.1 Heparin Dosing Weight: 73.8 kg  Labs: Recent Labs    12/06/22 0309 12/06/22 1012 12/06/22 2104 12/07/22 0524 12/07/22 1059 12/08/22 0003 12/08/22 0331 12/08/22 0558  HGB  --   --   --   --   --   --  9.6*  --   HCT  --   --   --   --   --   --  31.1*  --   PLT  --   --   --   --   --   --  202  --   APTT 64* 63* 65* 82*  --   --   --   --   HEPARINUNFRC 0.41  --   --  0.53 <0.10* 0.43  --  0.50  CREATININE 0.80  --   --  0.61  --   --  0.59  --      Estimated Creatinine Clearance: 79.5 mL/min (by C-G formula based on SCr of 0.59 mg/dL).   Medical History: Past Medical History:  Diagnosis Date   Abdominal aortic atherosclerosis ([place])    a. 05/2017 CTA abd/pelvis: significant atherosclerotic dzs of the infrarenal abd Ao w/ some mural thrombus. No aneurysm or dissection.   Acute focal ischemia of small intestine (HCC)    Acute right-sided low back pain with right-sided sciatica 06/13/2017   AKI (acute kidney injury) ([place]) 07/29/2017   Anemia    Anginal pain ([place])    a. 08/2012 Lexiscan MV: EF 54%, non ischemia/infarct.   Anxiety and depression    Baker's cyst of knee, right    a. 07/2016 U/S: 4.1 x 1.4 x 2.9 cystic structure in R poplitetal fossa.   Bell's palsy    CAD (coronary artery disease)    CHF (congestive heart failure) ([place])    a.) TTE 06/20/2017: EF 60-65%, no rwma, G1DD, no source of cardiac emboli.   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 05/2017 CTA Abd/pelvis: apparent thrombus or embolus in prox SMA (70-90%); b. 05/2017 catheter directed tPA,  mechanical thrombectomy, and stenting of the SMA.   Essential hypertension with goal blood pressure less than 140/90 01/19/2016   Gastroesophageal reflux disease 01/19/2016   GERD (gastroesophageal reflux disease)    H/O colectomy    History of 2019 novel coronavirus disease (COVID-19) 11/14/2019   History of kidney stones    Hyperlipidemia    Hypertension    Morbid obesity with BMI of 40.0-44.9, adult ([place])    NSTEMI (non-ST elevated myocardial infarction) ([place]) 06/27/2021   a.) high sensitivity troponins trended: 893 --> 1587 --> 1748 --> 868 --> 735 ng/L   Occlusive mesenteric ischemia ([place]) 06/19/2017   Osteoarthritis    PAD (peripheral artery disease) (HCC)    PAF (paroxysmal atrial fibrillation) (HCC)    a.) CHA2DS2-VASc = 6 (sex, CHF, HTN, DVT x2, aortic plaque). b.) rate/rhythm maintained on oral metoprolol tartrate; chronically anticoagulated with standard dose apixaban.   Palpitations    Peripheral edema    Personal history of other malignant neoplasm of skin 06/01/2016  Pneumonia    Pre-diabetes    Primary osteoarthritis of both knees 06/13/2017   SBO (small bowel obstruction) ([place]) 08/06/2017   Skin cancer of nose    Superior mesenteric artery thrombosis (HCC)    Vitamin D deficiency     Medications:  Apixaban 5 mg BID (last patient reported dose was 2/10 PM)  Assessment: Patient is a 62 y/o F with medical history as above and including Afib and Hx DVT on apixaban who is admitted with Rt AKA stump infection / OM. Patient may need surgical intervention. Pharmacy consulted to initiate and mange heparin infusion while apixaban is on hold. 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    Goal of Therapy:  Heparin level 0.3-0.7 units/ml Monitor platelets by anticoagulation protocol: Yes   Plan:   Per RN, vascular wants to start heparin 1800. Will restart at  previous rate 1550 units/h No false elevated from DOAC.  2/16:  HL @ 0558 = 0.50, therapeutic X 2 Will continue pt on current rate and recheck HL on 2/17 with AM labs.    Yosgar Demirjian D, PharmD 12/08/2022 6:40 AM

## 2022-12-08 NOTE — Progress Notes (Signed)
Progress Note    12/08/2022 12:15 PM 1 Day Post-Op  Subjective:  Carla [name] is a 62 y.o. female who presents with a persistent wound in her right above-knee amputation and an area of previous hematoma evacuation above the original incision.  Imaging studies suggested osteomyelitis of the femur and an abscess around the femur.  She is now postop day 1 from incision and drainage of clotted and nonclotted blood products.  On exam this morning the patient is resting comfortably in bed.  She does complain of pain to her right above-the-knee amputation site.  Wound VAC is in place and working properly.  All remained stable.  No other complaints.   Vitals:   12/08/22 0427 12/08/22 0740  BP: (!) 99/57 105/70  Pulse: 73 76  Resp: 16 16  Temp: 98.4 F (36.9 C) 99 F (37.2 C)  SpO2: 96% 96%   Physical Exam: Cardiac:  RRR Lungs: There to auscultation.  Respiratory effort normal.  Respiration rate 16.  No wheezing rhonchi or rales to note. Incisions: Right AKA soft tissue incision where previous and healing ulceration was present.  Wound VAC in place and working properly. Extremities: Right AKA. Abdomen: Positive bowel sounds, soft, nontender abdomen. Neurologic: Alert and oriented x 3.  Neuro intact.  Answers all questions appropriately.  CBC    Component Value Date/Time   WBC 9.7 12/08/2022 0331   RBC 3.57 (L) 12/08/2022 0331   HGB 9.6 (L) 12/08/2022 0331   HGB 13.4 02/12/2015 1137   HCT 31.1 (L) 12/08/2022 0331   HCT 40.9 02/12/2015 1137   PLT 202 12/08/2022 0331   PLT 272 02/12/2015 1137   MCV 87.1 12/08/2022 0331   MCV 90 02/12/2015 1137   MCH 26.9 12/08/2022 0331   MCHC 30.9 12/08/2022 0331   RDW 18.2 (H) 12/08/2022 0331   RDW 14.8 (H) 02/12/2015 1137   LYMPHSABS 2.7 12/03/2022 0359   LYMPHSABS 1.7 08/27/2014 1145   MONOABS 0.8 12/03/2022 0359   MONOABS 0.4 08/27/2014 1145   EOSABS 0.1 12/03/2022 0359   EOSABS 0.0 08/27/2014 1145   BASOSABS 0.1 12/03/2022 0359    BASOSABS 0.0 08/27/2014 1145    BMET    Component Value Date/Time   NA 133 (L) 12/08/2022 0331   NA 140 02/12/2015 1137   K 4.2 12/08/2022 0331   K 2.9 (L) 02/12/2015 1137   CL 103 12/08/2022 0331   CL 107 02/12/2015 1137   CO2 20 (L) 12/08/2022 0331   CO2 27 02/12/2015 1137   GLUCOSE 196 (H) 12/08/2022 0331   GLUCOSE 120 (H) 02/12/2015 1137   BUN 12 12/08/2022 0331   BUN 15 02/12/2015 1137   CREATININE 0.59 12/08/2022 0331   CREATININE 0.78 02/12/2015 1137   CALCIUM 8.1 (L) 12/08/2022 0331   CALCIUM 9.0 02/12/2015 1137   GFRNONAA >60 12/08/2022 0331   GFRNONAA >60 02/12/2015 1137   GFRAA >60 11/23/2019 1330   GFRAA >60 02/12/2015 1137    INR    Component Value Date/Time   INR 1.1 12/04/2022 0451   INR 0.8 07/23/2012 0433     Intake/Output Summary (Last 24 hours) at 12/08/2022 1215 Last data filed at 12/08/2022 0406 Gross per 24 hour  Intake 256.52 ml  Output 850 ml  Net -593.48 ml     Assessment/Plan:  62 y.o. female is s/p incision and drainage of clotted and 9 clotted blood products.  1 Day Post-Op   Plan: Vascular surgery is okay with patient being discharged today. No  abscess or pus was drained from patient's right AKA.  What appears to have been read on imaging as an abscess was a pocket containing clotted and none clotted blood products. Patient's white blood cell count this morning was normal at 9.7.  Hemoglobin and hematocrit remained stable at 9.6 and 31.1.  Initial microbiology of the right femur bone that was taken during surgery shows no organisms or growth at this time. Patient will need to go home with wound VAC in place and changes by home health 2 times a week. Patient will need to continue on Eliquis 5 mg twice daily and aspirin 81 mg daily.   Patient to follow-up with vascular surgery in clinic in 1 month for overall evaluation.   DVT prophylaxis: ASA 81 mg daily, Eliquis 5 mg twice daily   Drema [facility] 12/08/2022 12:15 PM

## 2022-12-08 NOTE — Plan of Care (Signed)

## 2022-12-08 NOTE — Consult Note (Signed)
ANTICOAGULATION CONSULT NOTE  Pharmacy Consult for IV Heparin Indication: atrial fibrillation and Hx DVT   Patient Measurements: Height: 5' 2"$  (157.5 cm) Weight: 99.8 kg (220 lb) IBW/kg (Calculated) : 50.1 Heparin Dosing Weight: 73.8 kg  Labs: Recent Labs    12/05/22 0451 12/05/22 1146 12/06/22 0309 12/06/22 1012 12/06/22 2104 12/07/22 0524 12/07/22 1059 12/08/22 0003  HGB 10.3*  --   --   --   --   --   --   --   HCT 33.9*  --   --   --   --   --   --   --   PLT 198  --   --   --   --   --   --   --   APTT 107*   < > 64* 63* 65* 82*  --   --   HEPARINUNFRC 0.79*  --  0.41  --   --  0.53 <0.10* 0.43  CREATININE 0.78  --  0.80  --   --  0.61  --   --    < > = values in this interval not displayed.     Estimated Creatinine Clearance: 79.5 mL/min (by C-G formula based on SCr of 0.61 mg/dL).   Medical History: Past Medical History:  Diagnosis Date   Abdominal aortic atherosclerosis ([place])    a. 05/2017 CTA abd/pelvis: significant atherosclerotic dzs of the infrarenal abd Ao w/ some mural thrombus. No aneurysm or dissection.   Acute focal ischemia of small intestine (HCC)    Acute right-sided low back pain with right-sided sciatica 06/13/2017   AKI (acute kidney injury) ([place]) 07/29/2017   Anemia    Anginal pain ([place])    a. 08/2012 Lexiscan MV: EF 54%, non ischemia/infarct.   Anxiety and depression    Baker's cyst of knee, right    a. 07/2016 U/S: 4.1 x 1.4 x 2.9 cystic structure in R poplitetal fossa.   Bell's palsy    CAD (coronary artery disease)    CHF (congestive heart failure) ([place])    a.) TTE 06/20/2017: EF 60-65%, no rwma, G1DD, no source of cardiac emboli.   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 05/2017 CTA Abd/pelvis: apparent thrombus or embolus in  prox SMA (70-90%); b. 05/2017 catheter directed tPA, mechanical thrombectomy, and stenting of the SMA.   Essential hypertension with goal blood pressure less than 140/90 01/19/2016   Gastroesophageal reflux disease 01/19/2016   GERD (gastroesophageal reflux disease)    H/O colectomy    History of 2019 novel coronavirus disease (COVID-19) 11/14/2019   History of kidney stones    Hyperlipidemia    Hypertension    Morbid obesity with BMI of 40.0-44.9, adult ([place])    NSTEMI (non-ST elevated myocardial infarction) ([place]) 06/27/2021   a.) high sensitivity troponins trended: 893 --> 1587 --> 1748 --> 868 --> 735 ng/L   Occlusive mesenteric ischemia ([place]) 06/19/2017   Osteoarthritis    PAD (peripheral artery disease) (HCC)    PAF (paroxysmal atrial fibrillation) (HCC)    a.) CHA2DS2-VASc = 6 (sex, CHF, HTN, DVT x2, aortic plaque). b.) rate/rhythm maintained on oral metoprolol tartrate; chronically anticoagulated with standard dose apixaban.   Palpitations    Peripheral edema  Personal history of other malignant neoplasm of skin 06/01/2016   Pneumonia    Pre-diabetes    Primary osteoarthritis of both knees 06/13/2017   SBO (small bowel obstruction) ([place]) 08/06/2017   Skin cancer of nose    Superior mesenteric artery thrombosis (HCC)    Vitamin D deficiency     Medications:  Apixaban 5 mg BID (last patient reported dose was 2/10 PM)  Assessment: Patient is a 62 y/o F with medical history as above and including Afib and Hx DVT on apixaban who is admitted with Rt AKA stump infection / OM. Patient may need surgical intervention. Pharmacy consulted to initiate and mange heparin infusion while apixaban is on hold. 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    Goal of Therapy:  Heparin level 0.3-0.7 units/ml Monitor platelets by anticoagulation protocol: Yes   Plan:   Per RN,  vascular wants to start heparin 1800. Will restart at previous rate 1550 units/hr. Recheck heparin level in 6 hours. No false elevated from DOAC.  2/16:  HL @ 0003 = 0.43, therapeutic X 1 Will continue pt on current rate and recheck HL in 6 hrs on 2/16 @ 0600.    Zackery Brine D, PharmD 12/08/2022 12:45 AM

## 2022-12-08 NOTE — Progress Notes (Signed)
PROGRESS NOTE    Carla [name]  [id number] DOB: 07/26/1961 DOA: 12/03/2022 PCP: [facility]   Brief Narrative:  This 62 y.o. female with medical history significant of CHF , A-fib , peripheral vascular disease , COPD,  hypertension,  hyperlipidemia,  arthritis,  scoliosis and depression.  She complains of leg pain, heart fluttering,  dizziness and shortness of breath.  For the past 2-1/2 months she is having open wound on her right leg.  Her last dose of Eliquis was last night.  Also been very fatigued and sweating.  Has not noticed a fever.  CT scan of the femur shows stump osteomyelitis, complex fluid collection measuring 7 x 6 x 7.4 cm, tract from the collection to the open wound.  Patient was started on vancomycin and Zosyn and hospitalist services contacted for further evaluation.  Vascular surgery was consulted. Patient underwent Evacuation of hematoma from right above-knee amputation site, revision of amputation with resection of femur, negative pressure dressing placement.   Assessment & Plan:   Principal Problem:   Acute osteomyelitis of right thigh (HCC) Active Problems:   Hypokalemia   PVD (peripheral vascular disease) (HCC)   Paroxysmal atrial fibrillation (HCC)   Hyperlipidemia, unspecified   Chronic pain   Obesity, Class III, BMI 40-49.9 (morbid obesity) ([place])   Essential hypertension   GERD (gastroesophageal reflux disease)   Neuropathy   Diarrhea  Acute osteomyelitis of right thigh: She presented with Right stump infection.  CT A/P: Showed fluid collection around the graft slightly increased in the interval. 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  Likely will need an AKA revision.  Case discussed with vascular surgery.  Eliquis was discontinued.  Started on heparin drip.  Continue empiric antibiotics (vancomycin and Zosyn).  Follow-up wound care. Vascular surgery consulted,  s/p Evacuation of hematoma  from right above-knee amputation site, revision of amputation with resection of femur, negative pressure dressing placement.   Continue adequate pain control. Infectious disease consulted,  antibiotics de-escalated to meropenem,  needs long-term antibiotics.  Hypokalemia: Replaced and resolved.   Peripheral vascular disease: Patient has a predisposition for developing blood clots,  will bridge with heparin drip at this point.   Paroxysmal atrial fibrillation: Eliquis kept on hold. Started on heparin for bridging.  Continue metoprolol for rate control. Switch to Eliquis before discharge.   Hyperlipidemia, unspecified Continue statin.   Chronic pain: Continue her pain regimen.   Obesity, Class III, BMI 40-49.9 (morbid obesity) (HCC) BMI 40.24 with current height and weight in computer   Diarrhea: Improved No stool studies.  Hold MiraLAX.   Neuropathy: Continue Lyrica 200 mg three times daily.   GERD (gastroesophageal reflux disease) Continue pantoprazole 40 mg daily.   Essential hypertension: Continue metoprolol 12.5 mg q12hr.  Anxiety: Continue Xanax 0.5 mg twice daily as needed for anxiety.  DVT prophylaxis: Heparin IV Code Status: Full code. Family Communication: No family at bed side. Disposition Plan:   Status is: Inpatient Remains inpatient appropriate because: Admitted for acute osteomyelitis of the stump.  Vascular surgery consulted   s/p Evacuation of hematoma from right above-knee amputation site, revision of amputation with resection of femur, negative pressure dressing placement.  Infectious disease consulted recommended long-term IV antibiotics.   Consultants:  Vascular surgery Infectious Diseases.  Procedures: Antimicrobials:  Anti-infectives (From admission, onward)    Start     Dose/Rate Route Frequency Ordered Stop   12/07/22 0833  ceFAZolin (ANCEF) 2-4 GM/100ML-%  IVPB       Note to Pharmacy: Jeanene Erb E: cabinet override      12/07/22  0833 12/07/22 0911   12/06/22 2212  ceFAZolin (ANCEF) IVPB 2g/100 mL premix        2 g 200 mL/hr over 30 Minutes Intravenous 30 min pre-op 12/06/22 2212 12/07/22 0847   12/06/22 2200  meropenem (MERREM) 1 g in sodium chloride 0.9 % 100 mL IVPB        1 g 200 mL/hr over 30 Minutes Intravenous Every 8 hours 12/06/22 1634     12/04/22 1000  vancomycin (VANCOREADY) IVPB 1250 mg/250 mL  Status:  Discontinued        1,250 mg 166.7 mL/hr over 90 Minutes Intravenous Every 24 hours 12/03/22 0802 12/06/22 1634   12/03/22 1400  piperacillin-tazobactam (ZOSYN) IVPB 3.375 g        3.375 g 12.5 mL/hr over 240 Minutes Intravenous Every 8 hours 12/03/22 0801 12/06/22 2358   12/03/22 0630  piperacillin-tazobactam (ZOSYN) IVPB 3.375 g        3.375 g 100 mL/hr over 30 Minutes Intravenous  Once 12/03/22 0623 12/03/22 0715   12/03/22 0630  vancomycin (VANCOREADY) IVPB 2000 mg/400 mL        2,000 mg 200 mL/hr over 120 Minutes Intravenous  Once 12/03/22 [id number] 12/03/22 0916      Subjective: Patient was seen and examined at bedside.  Overnight events noted.   She is s/p right AKA revision. POD # 2.  Patient reports having significant pain in the extremity area. Pain medications adjusted.  Objective: Vitals:   12/07/22 1534 12/07/22 2155 12/08/22 0427 12/08/22 0740  BP: 107/67 (!) 124/101 (!) 99/57 105/70  Pulse: 81 85 73 76  Resp: (!) 22 19 16 16  $ Temp: 98.2 F (36.8 C) 98.3 F (36.8 C) 98.4 F (36.9 C) 99 F (37.2 C)  TempSrc:  Oral Oral   SpO2: 93% 95% 96% 96%  Weight:      Height:        Intake/Output Summary (Last 24 hours) at 12/08/2022 1317 Last data filed at 12/08/2022 0406 Gross per 24 hour  Intake 256.52 ml  Output 850 ml  Net -593.48 ml   Filed Weights   12/03/22 0319  Weight: 99.8 kg    Examination:  General exam: Appears comfortable, deconditioned but remains in pain. Respiratory system: CTA bilaterally. Respiratory effort normal.  RR 16 Cardiovascular system: S1 & S2  heard, regular rate and rhythm, no murmur. Gastrointestinal system: Abdomen is soft, non tender, non distended, BS+ Central nervous system: Alert and oriented x 3 . No focal neurological deficits. Extremities: S/p right AKA revision.  Dressing noted. Skin: No rashes, lesions or ulcers Psychiatry: Judgement and insight appear normal. Mood & affect appropriate.     Data Reviewed: I have personally reviewed following labs and imaging studies  CBC: Recent Labs  Lab 12/03/22 0359 12/04/22 0451 12/05/22 0451 12/08/22 0331  WBC 10.8* 6.6 6.4 9.7  NEUTROABS 7.2  --   --   --   HGB 14.0 10.6* 10.3* 9.6*  HCT 44.4 34.0* 33.9* 31.1*  MCV 83.8 86.3 86.3 87.1  PLT 272 209 198 [id number]   Basic Metabolic Panel: Recent Labs  Lab 12/03/22 0358 12/03/22 0359 12/03/22 0359 12/04/22 0451 12/05/22 0451 12/06/22 0309 12/07/22 0524 12/08/22 0331  NA  --  139  --  138 139  --   --  133*  K  --  3.0*  --  3.3* 3.9  --   --  4.2  CL  --  107  --  108 112*  --   --  103  CO2  --  21*  --  21* 20*  --   --  20*  GLUCOSE  --  125*  --  123* 111*  --   --  196*  BUN  --  14  --  15 10  --   --  12  CREATININE  --  0.80   < > 0.88 0.78 0.80 0.61 0.59  CALCIUM  --  9.4  --  7.9* 8.0*  --   --  8.1*  MG 2.2  --   --   --  1.9  --   --  2.1  PHOS  --   --   --   --  4.1  --   --  2.7   < > = values in this interval not displayed.   GFR: Estimated Creatinine Clearance: 79.5 mL/min (by C-G formula based on SCr of 0.59 mg/dL). Liver Function Tests: Recent Labs  Lab 12/03/22 0359  AST 23  ALT 15  ALKPHOS 121  BILITOT 1.0  PROT 7.9  ALBUMIN 3.7   No results for input(s): "LIPASE", "AMYLASE" in the last 168 hours. No results for input(s): "AMMONIA" in the last 168 hours. Coagulation Profile: Recent Labs  Lab 12/03/22 0832 12/04/22 0451  INR 1.1 1.1   Cardiac Enzymes: No results for input(s): "CKTOTAL", "CKMB", "CKMBINDEX", "TROPONINI" in the last 168 hours. BNP (last 3 results) No results  for input(s): "PROBNP" in the last 8760 hours. HbA1C: No results for input(s): "HGBA1C" in the last 72 hours. CBG: No results for input(s): "GLUCAP" in the last 168 hours. Lipid Profile: No results for input(s): "CHOL", "HDL", "LDLCALC", "TRIG", "CHOLHDL", "LDLDIRECT" in the last 72 hours. Thyroid Function Tests: No results for input(s): "TSH", "T4TOTAL", "FREET4", "T3FREE", "THYROIDAB" in the last 72 hours. Anemia Panel: No results for input(s): "VITAMINB12", "FOLATE", "FERRITIN", "TIBC", "IRON", "RETICCTPCT" in the last 72 hours. Sepsis Labs: Recent Labs  Lab 12/03/22 0358  PROCALCITON <0.10    Recent Results (from the past 240 hour(s))  Resp panel by RT-PCR (RSV, Flu A&B, Covid) Anterior Nasal Swab     Status: None   Collection Time: 12/03/22  3:59 AM   Specimen: Anterior Nasal Swab  Result Value Ref Range Status   SARS Coronavirus 2 by RT PCR NEGATIVE NEGATIVE Final    Comment: (NOTE) SARS-CoV-2 target nucleic acids are NOT DETECTED.  The SARS-CoV-2 RNA is generally detectable in upper respiratory specimens during the acute phase of infection. The lowest concentration of SARS-CoV-2 viral copies this assay can detect is 138 copies/mL. A negative result does not preclude SARS-Cov-2 infection and should not be used as the sole basis for treatment or other patient management decisions. A negative result may occur with  improper specimen collection/handling, submission of specimen other than nasopharyngeal swab, presence of viral mutation(s) within the areas targeted by this assay, and inadequate number of viral copies(<138 copies/mL). A negative result must be combined with clinical observations, patient history, and epidemiological information. The expected result is Negative.  Fact Sheet for Patients:  EntrepreneurPulse.com.au  Fact Sheet for Healthcare Providers:  IncredibleEmployment.be  This test is no t yet approved or cleared by  the [place] FDA and  has been authorized for detection and/or diagnosis of SARS-CoV-2 by FDA under an Emergency Use Authorization (EUA). This EUA will remain  in effect (meaning this test can be used) for  the duration of the COVID-19 declaration under Section 564(b)(1) of the Act, 21 U.S.C.section 360bbb-3(b)(1), unless the authorization is terminated  or revoked sooner.       Influenza A by PCR NEGATIVE NEGATIVE Final   Influenza B by PCR NEGATIVE NEGATIVE Final    Comment: (NOTE) The Xpert Xpress SARS-CoV-2/FLU/RSV plus assay is intended as an aid in the diagnosis of influenza from Nasopharyngeal swab specimens and should not be used as a sole basis for treatment. Nasal washings and aspirates are unacceptable for Xpert Xpress SARS-CoV-2/FLU/RSV testing.  Fact Sheet for Patients: EntrepreneurPulse.com.au  Fact Sheet for Healthcare Providers: IncredibleEmployment.be  This test is not yet approved or cleared by the [place] FDA and has been authorized for detection and/or diagnosis of SARS-CoV-2 by FDA under an Emergency Use Authorization (EUA). This EUA will remain in effect (meaning this test can be used) for the duration of the COVID-19 declaration under Section 564(b)(1) of the Act, 21 U.S.C. section 360bbb-3(b)(1), unless the authorization is terminated or revoked.     Resp Syncytial Virus by PCR NEGATIVE NEGATIVE Final    Comment: (NOTE) Fact Sheet for Patients: EntrepreneurPulse.com.au  Fact Sheet for Healthcare Providers: IncredibleEmployment.be  This test is not yet approved or cleared by the [place] FDA and has been authorized for detection and/or diagnosis of SARS-CoV-2 by FDA under an Emergency Use Authorization (EUA). This EUA will remain in effect (meaning this test can be used) for the duration of the COVID-19 declaration under Section 564(b)(1) of the Act, 21  U.S.C. section 360bbb-3(b)(1), unless the authorization is terminated or revoked.  Performed at [facility], [address] [place], [place] [address]   Aerobic/Anaerobic Culture w Gram Stain (surgical/deep wound)     Status: None (Preliminary result)   Collection Time: 12/03/22  9:56 AM   Specimen: Abscess; Wound  Result Value Ref Range Status   Specimen Description   Final    ABSCESS Performed at [facility], [address] [place], [place] [address]    Special Requests   Final    Normal Performed at [facility], [address] [place], [place] [address]    Gram Stain   Final    MODERATE WBC PRESENT, PREDOMINANTLY PMN NO ORGANISMS SEEN Performed at [facility], [place] [address], [place], [place] [address]    Culture   Final    RARE ESCHERICHIA COLI Confirmed Extended Spectrum Beta-Lactamase Producer (ESBL).  In bloodstream infections from ESBL organisms, carbapenems are preferred over piperacillin/tazobactam. They are shown to have a lower risk of mortality. RARE ENTEROCOCCUS FAECALIS NO ANAEROBES ISOLATED; CULTURE IN PROGRESS FOR 5 DAYS    Report Status PENDING  Incomplete   Organism ID, Bacteria ESCHERICHIA COLI  Final   Organism ID, Bacteria ENTEROCOCCUS FAECALIS  Final      Susceptibility   Escherichia coli - MIC*    AMPICILLIN >=32 RESISTANT Resistant     CEFEPIME >=32 RESISTANT Resistant     CEFTAZIDIME RESISTANT Resistant     CEFTRIAXONE >=64 RESISTANT Resistant     CIPROFLOXACIN >=4 RESISTANT Resistant     GENTAMICIN <=1 SENSITIVE Sensitive     IMIPENEM <=0.25 SENSITIVE Sensitive     TRIMETH/SULFA >=320 RESISTANT Resistant     AMPICILLIN/SULBACTAM 16 INTERMEDIATE Intermediate     PIP/TAZO <=4 SENSITIVE Sensitive     * RARE ESCHERICHIA COLI   Enterococcus faecalis - MIC*    AMPICILLIN <=2 SENSITIVE Sensitive     VANCOMYCIN 1 SENSITIVE Sensitive  GENTAMICIN SYNERGY SENSITIVE Sensitive     * RARE ENTEROCOCCUS  FAECALIS  Surgical PCR screen     Status: Abnormal   Collection Time: 12/07/22  2:38 AM   Specimen: Nasal Mucosa; Nasal Swab  Result Value Ref Range Status   MRSA, PCR POSITIVE (A) NEGATIVE Final    Comment: CRITICAL RESULT CALLED TO, READ BACK BY AND VERIFIED WITH: ERWIN MACROHON RN @0512$  12/07/22 ASW    Staphylococcus aureus POSITIVE (A) NEGATIVE Final    Comment: CRITICAL RESULT CALLED TO, READ BACK BY AND VERIFIED WITH: ERWIN MACROHON RN @0512$  12/07/22 ASW (NOTE) The Xpert SA Assay (FDA approved for NASAL specimens in patients 64 years of age and older), is one component of a comprehensive surveillance program. It is not intended to diagnose infection nor to guide or monitor treatment. Performed at [facility], [address] [place], [place] [address]   Aerobic/Anaerobic Culture w Gram Stain (surgical/deep wound)     Status: None (Preliminary result)   Collection Time: 12/07/22  9:10 AM   Specimen: PATH Other; Body Fluid  Result Value Ref Range Status   Specimen Description   Final    WOUND RIGHT ABOVE THE KNEE Performed at [facility], [address] [place], [place] [address]    Special Requests   Final    NONE Performed at [facility], [address] [place], [place] [address]    Gram Stain NO WBC SEEN NO ORGANISMS SEEN   Final   Culture   Final    CULTURE REINCUBATED FOR BETTER GROWTH Performed at [facility], [place] [address], [place], [place] [address]    Report Status PENDING  Incomplete  Aerobic/Anaerobic Culture w Gram Stain (surgical/deep wound)     Status: None (Preliminary result)   Collection Time: 12/07/22  9:18 AM   Specimen: PATH Other; Tissue  Result Value Ref Range Status   Specimen Description   Final    BONE RIGHT ABOVE THE KNEE AMPUTATION BONE Performed at [facility], [address] [place], [place] [address]    Special Requests   Final    NONE Performed at [facility], [address] [place], [place] [address]    Gram Stain   Final    FEW WBC PRESENT,BOTH PMN AND MONONUCLEAR NO ORGANISMS SEEN    Culture   Final    NO GROWTH < 24 HOURS Performed at [facility], [place] [address] [place],  [address]    Report Status PENDING  Incomplete    Radiology Studies: No results found.  Scheduled Meds:  atorvastatin  80 mg Oral QHS   Chlorhexidine Gluconate Cloth  6 each Topical Q0600   ferrous sulfate  325 mg Oral Q breakfast   isosorbide mononitrate  60 mg Oral Daily   lidocaine  1 patch Transdermal Q24H   metoprolol tartrate  12.5 mg Oral BID   morphine  15 mg Oral Q12H   multivitamin with minerals  1 tablet Oral Daily   mupirocin ointment  1 Application Nasal BID   pantoprazole  40 mg Oral Daily   pregabalin  200 mg Oral TID   Continuous Infusions:  heparin 1,550 Units/hr (12/08/22 0936)   meropenem (MERREM) IV 1 g (12/08/22 0605)     LOS: 5 days    Time spent: 35 mins    Leighana Neyman, MD [facility]   If 7PM-7AM, please contact night-coverage

## 2022-12-08 NOTE — Progress Notes (Signed)
   Date of Admission:  12/03/2022      ID: Carla [name] is a 62 y.o. female  Principal Problem:   Acute osteomyelitis of right thigh ([place]) Active Problems:   Essential hypertension   GERD (gastroesophageal reflux disease)   Obesity, Class III, BMI 40-49.9 (morbid obesity) (HCC)   Paroxysmal atrial fibrillation (HCC)   Chronic pain   PVD (peripheral vascular disease) (HCC)   Hypokalemia   Hyperlipidemia, unspecified   Neuropathy   Diarrhea    Subjective: Pt sedated and sleeping with fentanyl, morphine, NS contin , Oxycodone  Medications:   atorvastatin  80 mg Oral QHS   Chlorhexidine Gluconate Cloth  6 each Topical Q0600   ferrous sulfate  325 mg Oral Q breakfast   isosorbide mononitrate  60 mg Oral Daily   lidocaine  1 patch Transdermal Q24H   metoprolol tartrate  12.5 mg Oral BID   morphine  15 mg Oral Q12H   multivitamin with minerals  1 tablet Oral Daily   mupirocin ointment  1 Application Nasal BID   pantoprazole  40 mg Oral Daily   pregabalin  200 mg Oral TID    Objective: Vital signs in last 24 hours: Temp:  [98.2 F (36.8 C)-99 F (37.2 C)] 99 F (37.2 C) (02/16 0740) Pulse Rate:  [73-85] 76 (02/16 0740) Resp:  [16-22] 16 (02/16 0740) BP: (99-124)/(57-101) 105/70 (02/16 0740) SpO2:  [93 %-96 %] 96 % (02/16 0740)  LDA Foley Central lines Other catheters  PHYSICAL EXAM:  General: sedated/sleeping 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   Assessment/Plan: Infected Rt AKA stump wound  Acute osteomyelitis of Rt femur S/p revision ESBL E.coli infection On Meropenem Will need a long course    Anemia  PAD with h/o thrombectomy, Bypass   Paroxysmal Afib    HLD on statin  ID will follow her peripherally  this weekend- call if needed

## 2022-12-09 ENCOUNTER — Inpatient Hospital Stay: Payer: Self-pay

## 2022-12-09 DIAGNOSIS — M86151 Other acute osteomyelitis, right femur: Secondary | ICD-10-CM | POA: Diagnosis not present

## 2022-12-09 LAB — CBC
HCT: 30.2 % — ABNORMAL LOW (ref 36.0–46.0)
Hemoglobin: 9.2 g/dL — ABNORMAL LOW (ref 12.0–15.0)
MCH: 26.5 pg (ref 26.0–34.0)
MCHC: 30.5 g/dL (ref 30.0–36.0)
MCV: 87 fL (ref 80.0–100.0)
Platelets: 210 10*3/uL (ref 150–400)
RBC: 3.47 MIL/uL — ABNORMAL LOW (ref 3.87–5.11)
RDW: 18.9 % — ABNORMAL HIGH (ref 11.5–15.5)
WBC: 7.2 10*3/uL (ref 4.0–10.5)
nRBC: 0 % (ref 0.0–0.2)

## 2022-12-09 LAB — AEROBIC/ANAEROBIC CULTURE W GRAM STAIN (SURGICAL/DEEP WOUND): Special Requests: NORMAL

## 2022-12-09 LAB — HEPARIN LEVEL (UNFRACTIONATED): Heparin Unfractionated: 0.65 IU/mL (ref 0.30–0.70)

## 2022-12-09 MED ORDER — OXYCODONE-ACETAMINOPHEN 5-325 MG PO TABS
1.0000 | ORAL_TABLET | ORAL | Status: AC | PRN
  Administered 2022-12-09 – 2022-12-10 (×3): 1 via ORAL
  Filled 2022-12-09 (×3): qty 1

## 2022-12-09 MED ORDER — APIXABAN 5 MG PO TABS
5.0000 mg | ORAL_TABLET | Freq: Two times a day (BID) | ORAL | Status: DC
  Administered 2022-12-09 – 2022-12-13 (×9): 5 mg via ORAL
  Filled 2022-12-09 (×9): qty 1

## 2022-12-09 MED ORDER — DOCUSATE SODIUM 100 MG PO CAPS
100.0000 mg | ORAL_CAPSULE | Freq: Every day | ORAL | Status: DC
  Administered 2022-12-09 – 2022-12-13 (×5): 100 mg via ORAL
  Filled 2022-12-09 (×5): qty 1

## 2022-12-09 MED ORDER — SENNA 8.6 MG PO TABS
1.0000 | ORAL_TABLET | Freq: Every day | ORAL | Status: DC
  Administered 2022-12-09 – 2022-12-13 (×5): 8.6 mg via ORAL
  Filled 2022-12-09 (×5): qty 1

## 2022-12-09 NOTE — Progress Notes (Signed)
ID PROGRESS NOTE   62yo F with hx of R AKA complicated by stump infection with infected hematoma, osteomyelitis on imaging POD#2 since I x D with wound vac in place  - 2/15 tissue culture- reincubating for better growth - initial cx polymicrobial including e.coli and e.faecalis  Currently on meropenem. afebrile  Micro: Escherichia coli Enterococcus faecalis      MIC MIC    AMPICILLIN >=32 RESIST... Resistant <=2 SENSITIVE Sensitive    AMPICILLIN/SULBACTAM 16 INTERMED... Intermediate      CEFEPIME >=32 RESIST... Resistant      CEFTAZIDIME RESISTANT Resistant      CEFTRIAXONE >=64 RESIST... Resistant      CIPROFLOXACIN >=4 RESISTANT Resistant      GENTAMICIN <=1 SENSITIVE Sensitive      GENTAMICIN SYNERGY   SENSITIVE Sensitive    IMIPENEM <=0.25 SENS... Sensitive      PIP/TAZO <=4 SENSITIVE Sensitive      TRIMETH/SULFA >=320 RESIS... Resistant      VANCOMYCIN   1 SENSITIVE Sensitive    A/p: femur osteomyelitis - plan for 6 wk of meropenem -please get picc line - will check sed rate and crp  Diagnosis: Femur osteomyelitis  Culture Result: polymicrobial  Allergies  Allergen Reactions   Gabapentin     Tremors    Bactrim [Sulfamethoxazole-Trimethoprim] Itching    OPAT Orders Discharge antibiotics to be given via PICC line Discharge antibiotics: Per pharmacy protocol meropenem  Duration: 6 wk End Date: January 19, 2023  [facility] Care Per Protocol:  Home health RN for IV administration and teaching; PICC line care and labs.    Labs weekly while on IV antibiotics: x__ CBC with differential _x_ BMP  _x_ CRP _x_ ESR   _x_ Please pull PIC at completion of IV antibiotics   Fax weekly labs to [phone]  Clinic Follow Up Appt: With Dr Delaine Lame in 4-6 wk

## 2022-12-09 NOTE — Consult Note (Signed)
ANTICOAGULATION CONSULT NOTE  Pharmacy Consult for IV Heparin Indication: atrial fibrillation and Hx DVT   Patient Measurements: Height: 5' 2"$  (157.5 cm) Weight: 99.8 kg (220 lb) IBW/kg (Calculated) : 50.1 Heparin Dosing Weight: 73.8 kg  Labs: Recent Labs    12/06/22 1012 12/06/22 2104 12/07/22 0524 12/07/22 1059 12/08/22 0003 12/08/22 0331 12/08/22 0558 12/09/22 0344  HGB  --   --   --   --   --  9.6*  --  9.2*  HCT  --   --   --   --   --  31.1*  --  30.2*  PLT  --   --   --   --   --  202  --  210  APTT 63* 65* 82*  --   --   --   --   --   HEPARINUNFRC  --   --  0.53   < > 0.43  --  0.50 0.65  CREATININE  --   --  0.61  --   --  0.59  --   --    < > = values in this interval not displayed.     Estimated Creatinine Clearance: 79.5 mL/min (by C-G formula based on SCr of 0.59 mg/dL).   Medical History: Past Medical History:  Diagnosis Date   Abdominal aortic atherosclerosis ([place])    a. 05/2017 CTA abd/pelvis: significant atherosclerotic dzs of the infrarenal abd Ao w/ some mural thrombus. No aneurysm or dissection.   Acute focal ischemia of small intestine (HCC)    Acute right-sided low back pain with right-sided sciatica 06/13/2017   AKI (acute kidney injury) ([place]) 07/29/2017   Anemia    Anginal pain ([place])    a. 08/2012 Lexiscan MV: EF 54%, non ischemia/infarct.   Anxiety and depression    Baker's cyst of knee, right    a. 07/2016 U/S: 4.1 x 1.4 x 2.9 cystic structure in R poplitetal fossa.   Bell's palsy    CAD (coronary artery disease)    CHF (congestive heart failure) ([place])    a.) TTE 06/20/2017: EF 60-65%, no rwma, G1DD, no source of cardiac emboli.   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 05/2017 CTA Abd/pelvis: apparent thrombus or embolus in prox  SMA (70-90%); b. 05/2017 catheter directed tPA, mechanical thrombectomy, and stenting of the SMA.   Essential hypertension with goal blood pressure less than 140/90 01/19/2016   Gastroesophageal reflux disease 01/19/2016   GERD (gastroesophageal reflux disease)    H/O colectomy    History of 2019 novel coronavirus disease (COVID-19) 11/14/2019   History of kidney stones    Hyperlipidemia    Hypertension    Morbid obesity with BMI of 40.0-44.9, adult ([place])    NSTEMI (non-ST elevated myocardial infarction) ([place]) 06/27/2021   a.) high sensitivity troponins trended: 893 --> 1587 --> 1748 --> 868 --> 735 ng/L   Occlusive mesenteric ischemia ([place]) 06/19/2017   Osteoarthritis    PAD (peripheral artery disease) (HCC)    PAF (paroxysmal atrial fibrillation) (HCC)    a.) CHA2DS2-VASc = 6 (sex, CHF, HTN, DVT x2, aortic plaque). b.) rate/rhythm maintained on oral metoprolol tartrate; chronically anticoagulated with standard dose apixaban.   Palpitations    Peripheral edema  Personal history of other malignant neoplasm of skin 06/01/2016   Pneumonia    Pre-diabetes    Primary osteoarthritis of both knees 06/13/2017   SBO (small bowel obstruction) ([place]) 08/06/2017   Skin cancer of nose    Superior mesenteric artery thrombosis (HCC)    Vitamin D deficiency     Medications:  Apixaban 5 mg BID (last patient reported dose was 2/10 PM)  Assessment: Patient is a 62 y/o F with medical history as above and including Afib and Hx DVT on apixaban who is admitted with Rt AKA stump infection / OM. Patient may need surgical intervention. Pharmacy consulted to initiate and mange heparin infusion while apixaban is on hold. 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    Goal of Therapy:  Heparin level 0.3-0.7 units/ml Monitor platelets by anticoagulation protocol: Yes   Plan:   Per RN, vascular  wants to start heparin 1800. Will restart at previous rate 1550 units/h No false elevated from DOAC.  2/17:  HL @ 0344 = 0.65, therapeutic X 3 Will continue pt on current rate and recheck HL on 2/18 with AM labs.   Jahzeel Poythress D, PharmD 12/09/2022 4:56 AM

## 2022-12-09 NOTE — Progress Notes (Signed)
Per Dr.Kumar: pt's daughter is now agreeable to PICC insertion. PICC team will likely see pt on 2/18 for insertion.

## 2022-12-09 NOTE — Progress Notes (Signed)
PICC consult: Arrived to room, pt sleeping after receiving IV pain medicine. Called daughter [name], to discuss PICC insertion/ risks and benefits. [name] has concerns related to pt's "vascular disease" and would like to discuss PICC with MD and talk it over with her mom. [name] plans to visit on 2/18 around 3PM and is available by phone. Will make RN aware.

## 2022-12-09 NOTE — Progress Notes (Signed)
PROGRESS NOTE    Carla [name]  [id number] DOB: July 31, 1961 DOA: 12/03/2022 PCP: [facility]   Brief Narrative:  This 62 y.o. female with medical history significant of CHF , A-fib , peripheral vascular disease , COPD,  hypertension,  hyperlipidemia,  arthritis,  scoliosis and depression.  She complains of leg pain, heart fluttering,  dizziness and shortness of breath.  For the past 2-1/2 months she is having open wound on her right leg.  Her last dose of Eliquis was last night.  Also been very fatigued and sweating.  Has not noticed a fever.  CT scan of the femur shows stump osteomyelitis, complex fluid collection measuring 7 x 6 x 7.4 cm, tract from the collection to the open wound.  Patient was started on vancomycin and Zosyn and hospitalist services contacted for further evaluation.  Vascular surgery was consulted. Patient underwent Evacuation of hematoma from right above-knee amputation site, revision of amputation with resection of femur, negative pressure dressing placement.   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  CT A/P: Showed fluid collection around the graft slightly increased in the interval. 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  Likely will need an AKA revision.  Case discussed with vascular surgery.  Eliquis was discontinued.  Started on heparin drip.  Continue empiric antibiotics (vancomycin and Zosyn).  Follow-up wound care. Vascular surgery  consulted,  s/p Evacuation of hematoma from right above-knee amputation site, revision of amputation with resection of femur, negative pressure dressing placement.   Continue adequate pain control. Infectious disease consulted,  antibiotics de-escalated to meropenem,  needs long-term antibiotics. As per vascular surgery patient can be discharged home with wound VAC. Confirmed with Dr. Graylon Good in Dr. Sandford Craze absence,  she recommended 4 weeks of IV meropenem.   PICC line ordered placed.  Hypokalemia: Replaced and resolved.   Peripheral vascular disease: Patient has a predisposition for developing blood clots,  will bridge with heparin drip at this point. Resumed to take on Eliquis.   Paroxysmal atrial fibrillation: Eliquis kept on hold. Started on heparin for bridging.  Continue metoprolol for rate control. Resume back to Eliquis.   Hyperlipidemia, unspecified Continue statin.   Chronic pain: Continue her pain regimen.   Obesity, Class III, BMI 40-49.9 (morbid obesity) (HCC) BMI 40.24 with current height and weight in computer   Diarrhea: Improved No stool studies.  Hold MiraLAX.   Neuropathy: Continue Lyrica 200 mg three times daily.   GERD (gastroesophageal reflux disease) Continue pantoprazole 40 mg daily.   Essential hypertension: Continue metoprolol 12.5 mg q12hr. BP improved.  Anxiety: Continue Xanax 0.5 mg twice daily as needed for anxiety.  DVT prophylaxis: Heparin IV Code Status: Full code. Family Communication: No family at bed side. Disposition Plan:   Status is: Inpatient Remains inpatient appropriate because: Admitted for acute osteomyelitis of the stump.  Vascular surgery consulted   s/p Evacuation of hematoma from right above-knee amputation site, revision of amputation with resection of femur, negative pressure dressing placement.  Infectious disease consulted recommended long-term IV antibiotics.   Consultants:  Vascular surgery Infectious  Diseases.  Procedures: Antimicrobials:  Anti-infectives (From admission, onward)    Start     Dose/Rate Route Frequency Ordered Stop   12/07/22 0833  ceFAZolin (ANCEF) 2-4 GM/100ML-% IVPB       Note to Pharmacy: Jeanene Erb E: cabinet override      12/07/22 0833 12/07/22 0911   12/06/22 2212  ceFAZolin (ANCEF) IVPB 2g/100 mL premix        2 g 200 mL/hr over 30 Minutes Intravenous 30 min pre-op 12/06/22 2212 12/07/22 0847   12/06/22 2200  meropenem (MERREM) 1 g in sodium chloride 0.9 % 100 mL IVPB        1 g 200 mL/hr over 30 Minutes Intravenous Every 8 hours 12/06/22 1634     12/04/22 1000  vancomycin (VANCOREADY) IVPB 1250 mg/250 mL  Status:  Discontinued        1,250 mg 166.7 mL/hr over 90 Minutes Intravenous Every 24 hours 12/03/22 0802 12/06/22 1634   12/03/22 1400  piperacillin-tazobactam (ZOSYN) IVPB 3.375 g        3.375 g 12.5 mL/hr over 240 Minutes Intravenous Every 8 hours 12/03/22 0801 12/06/22 2358   12/03/22 0630  piperacillin-tazobactam (ZOSYN) IVPB 3.375 g        3.375 g 100 mL/hr over 30 Minutes Intravenous  Once 12/03/22 0623 12/03/22 0715   12/03/22 0630  vancomycin (VANCOREADY) IVPB 2000 mg/400 mL        2,000 mg 200 mL/hr over 120 Minutes Intravenous  Once 12/03/22 [id number] 12/03/22 0916      Subjective: Patient was seen and examined at bedside.  Overnight events noted.   She is s/p right AKA revision. POD # 3.  Patient reports having significant pain in the extremity area. Pain medications adjusted.  Patient resumed back on Eliquis.  Objective: Vitals:   12/08/22 1935 12/09/22 0602 12/09/22 0809 12/09/22 1000  BP: (!) 112/58 118/64 (!) 108/52   Pulse: 72 78 76   Resp: 18 18 17   $ Temp: 98.3 F (36.8 C) 98.3 F (36.8 C) 98.1 F (36.7 C)   TempSrc: Oral Oral Oral   SpO2: 100% 100% (!) 6% (!) 12%  Weight:      Height:        Intake/Output Summary (Last 24 hours) at 12/09/2022 1351 Last data filed at 12/09/2022 1339 Gross per 24 hour  Intake 340 ml   Output 2600 ml  Net -2260 ml   Filed Weights   12/03/22 0319  Weight: 99.8 kg    Examination:  General exam: Appears comfortable, deconditioned, remains in pain. Respiratory system: CTA bilaterally. Respiratory effort normal.  RR 15 Cardiovascular system: S1 & S2 heard, regular rate and rhythm, no murmur. Gastrointestinal system: Abdomen is  soft, nontender, nondistended, BS+ Central nervous system: Alert and oriented x 3 . No focal neurological deficits. Extremities: S/p right AKA revision.  Dressing noted. Skin: No rashes, lesions or ulcers Psychiatry: Judgement and insight appear normal. Mood & affect appropriate.     Data Reviewed: I have personally reviewed following labs and imaging studies  CBC: Recent Labs  Lab 12/03/22 0359 12/04/22 0451 12/05/22 0451 12/08/22 0331 12/09/22 0344  WBC 10.8* 6.6 6.4 9.7 7.2  NEUTROABS 7.2  --   --   --   --   HGB 14.0 10.6* 10.3* 9.6* 9.2*  HCT 44.4 34.0* 33.9* 31.1* 30.2*  MCV 83.8 86.3 86.3 87.1 87.0  PLT 272 209  198 202 [id number]   Basic Metabolic Panel: Recent Labs  Lab 12/03/22 0358 12/03/22 0359 12/03/22 0359 12/04/22 0451 12/05/22 0451 12/06/22 0309 12/07/22 0524 12/08/22 0331  NA  --  139  --  138 139  --   --  133*  K  --  3.0*  --  3.3* 3.9  --   --  4.2  CL  --  107  --  108 112*  --   --  103  CO2  --  21*  --  21* 20*  --   --  20*  GLUCOSE  --  125*  --  123* 111*  --   --  196*  BUN  --  14  --  15 10  --   --  12  CREATININE  --  0.80   < > 0.88 0.78 0.80 0.61 0.59  CALCIUM  --  9.4  --  7.9* 8.0*  --   --  8.1*  MG 2.2  --   --   --  1.9  --   --  2.1  PHOS  --   --   --   --  4.1  --   --  2.7   < > = values in this interval not displayed.   GFR: Estimated Creatinine Clearance: 79.5 mL/min (by C-G formula based on SCr of 0.59 mg/dL). Liver Function Tests: Recent Labs  Lab 12/03/22 0359  AST 23  ALT 15  ALKPHOS 121  BILITOT 1.0  PROT 7.9  ALBUMIN 3.7   No results for input(s): "LIPASE",  "AMYLASE" in the last 168 hours. No results for input(s): "AMMONIA" in the last 168 hours. Coagulation Profile: Recent Labs  Lab 12/03/22 0832 12/04/22 0451  INR 1.1 1.1   Cardiac Enzymes: No results for input(s): "CKTOTAL", "CKMB", "CKMBINDEX", "TROPONINI" in the last 168 hours. BNP (last 3 results) No results for input(s): "PROBNP" in the last 8760 hours. HbA1C: No results for input(s): "HGBA1C" in the last 72 hours. CBG: No results for input(s): "GLUCAP" in the last 168 hours. Lipid Profile: No results for input(s): "CHOL", "HDL", "LDLCALC", "TRIG", "CHOLHDL", "LDLDIRECT" in the last 72 hours. Thyroid Function Tests: No results for input(s): "TSH", "T4TOTAL", "FREET4", "T3FREE", "THYROIDAB" in the last 72 hours. Anemia Panel: No results for input(s): "VITAMINB12", "FOLATE", "FERRITIN", "TIBC", "IRON", "RETICCTPCT" in the last 72 hours. Sepsis Labs: Recent Labs  Lab 12/03/22 0358  PROCALCITON <0.10    Recent Results (from the past 240 hour(s))  Resp panel by RT-PCR (RSV, Flu A&B, Covid) Anterior Nasal Swab     Status: None   Collection Time: 12/03/22  3:59 AM   Specimen: Anterior Nasal Swab  Result Value Ref Range Status   SARS Coronavirus 2 by RT PCR NEGATIVE NEGATIVE Final    Comment: (NOTE) SARS-CoV-2 target nucleic acids are NOT DETECTED.  The SARS-CoV-2 RNA is generally detectable in upper respiratory specimens during the acute phase of infection. The lowest concentration of SARS-CoV-2 viral copies this assay can detect is 138 copies/mL. A negative result does not preclude SARS-Cov-2 infection and should not be used as the sole basis for treatment or other patient management decisions. A negative result may occur with  improper specimen collection/handling, submission of specimen other than nasopharyngeal swab, presence of viral mutation(s) within the areas targeted by this assay, and inadequate number of viral copies(<138 copies/mL). A negative result must be  combined with clinical observations, patient history, and epidemiological information. The expected result is Negative.  Fact Sheet for Patients:  EntrepreneurPulse.com.au  Fact Sheet for Healthcare Providers:  IncredibleEmployment.be  This test is no t yet approved or cleared by the [place] FDA and  has been authorized for detection and/or diagnosis of SARS-CoV-2 by FDA under an Emergency Use Authorization (EUA). This EUA will remain  in effect (meaning this test can be used) for the duration of the COVID-19 declaration under Section 564(b)(1) of the Act, 21 U.S.C.section 360bbb-3(b)(1), unless the authorization is terminated  or revoked sooner.       Influenza A by PCR NEGATIVE NEGATIVE Final   Influenza B by PCR NEGATIVE NEGATIVE Final    Comment: (NOTE) The Xpert Xpress SARS-CoV-2/FLU/RSV plus assay is intended as an aid in the diagnosis of influenza from Nasopharyngeal swab specimens and should not be used as a sole basis for treatment. Nasal washings and aspirates are unacceptable for Xpert Xpress SARS-CoV-2/FLU/RSV testing.  Fact Sheet for Patients: EntrepreneurPulse.com.au  Fact Sheet for Healthcare Providers: IncredibleEmployment.be  This test is not yet approved or cleared by the [place] FDA and has been authorized for detection and/or diagnosis of SARS-CoV-2 by FDA under an Emergency Use Authorization (EUA). This EUA will remain in effect (meaning this test can be used) for the duration of the COVID-19 declaration under Section 564(b)(1) of the Act, 21 U.S.C. section 360bbb-3(b)(1), unless the authorization is terminated or revoked.     Resp Syncytial Virus by PCR NEGATIVE NEGATIVE Final    Comment: (NOTE) Fact Sheet for Patients: EntrepreneurPulse.com.au  Fact Sheet for Healthcare Providers: IncredibleEmployment.be  This test is not yet  approved or cleared by the [place] FDA and has been authorized for detection and/or diagnosis of SARS-CoV-2 by FDA under an Emergency Use Authorization (EUA). This EUA will remain in effect (meaning this test can be used) for the duration of the COVID-19 declaration under Section 564(b)(1) of the Act, 21 U.S.C. section 360bbb-3(b)(1), unless the authorization is terminated or revoked.  Performed at [facility], [address] [place], [place] [address]   Aerobic/Anaerobic Culture w Gram Stain (surgical/deep wound)     Status: None (Preliminary result)   Collection Time: 12/03/22  9:56 AM   Specimen: Abscess; Wound  Result Value Ref Range Status   Specimen Description   Final    ABSCESS Performed at [facility], [address] [place], [place] [address]    Special Requests   Final    Normal Performed at [facility], [address] [place], [place] [address]    Gram Stain   Final    MODERATE WBC PRESENT, PREDOMINANTLY PMN NO ORGANISMS SEEN    Culture   Final    RARE ESCHERICHIA COLI Confirmed Extended Spectrum Beta-Lactamase Producer (ESBL).  In bloodstream infections from ESBL organisms, carbapenems are preferred over piperacillin/tazobactam. They are shown to have a lower risk of mortality. RARE ENTEROCOCCUS FAECALIS HOLDING FOR POSSIBLE ANAEROBE Performed at [facility], [place] [address], [place], [place] [address]    Report Status PENDING  Incomplete   Organism ID, Bacteria ESCHERICHIA COLI  Final   Organism ID, Bacteria ENTEROCOCCUS FAECALIS  Final      Susceptibility   Escherichia coli - MIC*    AMPICILLIN >=32 RESISTANT Resistant     CEFEPIME >=32 RESISTANT Resistant     CEFTAZIDIME RESISTANT Resistant     CEFTRIAXONE >=64 RESISTANT Resistant     CIPROFLOXACIN >=4 RESISTANT Resistant     GENTAMICIN <=1 SENSITIVE Sensitive     IMIPENEM <=0.25  SENSITIVE Sensitive     TRIMETH/SULFA >=320 RESISTANT Resistant      AMPICILLIN/SULBACTAM 16 INTERMEDIATE Intermediate     PIP/TAZO <=4 SENSITIVE Sensitive     * RARE ESCHERICHIA COLI   Enterococcus faecalis - MIC*    AMPICILLIN <=2 SENSITIVE Sensitive     VANCOMYCIN 1 SENSITIVE Sensitive     GENTAMICIN SYNERGY SENSITIVE Sensitive     * RARE ENTEROCOCCUS FAECALIS  Surgical PCR screen     Status: Abnormal   Collection Time: 12/07/22  2:38 AM   Specimen: Nasal Mucosa; Nasal Swab  Result Value Ref Range Status   MRSA, PCR POSITIVE (A) NEGATIVE Final    Comment: CRITICAL RESULT CALLED TO, READ BACK BY AND VERIFIED WITH: ERWIN MACROHON RN @0512$  12/07/22 ASW    Staphylococcus aureus POSITIVE (A) NEGATIVE Final    Comment: CRITICAL RESULT CALLED TO, READ BACK BY AND VERIFIED WITH: Erlene Quan RN @0512$  12/07/22 ASW (NOTE) The Xpert SA Assay (FDA approved for NASAL specimens in patients 51 years of age and older), is one component of a comprehensive surveillance program. It is not intended to diagnose infection nor to guide or monitor treatment. Performed at [facility], [address] [place], [place] [address]   Aerobic/Anaerobic Culture w Gram Stain (surgical/deep wound)     Status: None (Preliminary result)   Collection Time: 12/07/22  9:10 AM   Specimen: PATH Other; Body Fluid  Result Value Ref Range Status   Specimen Description   Final    WOUND RIGHT ABOVE THE KNEE Performed at [facility], [address] [place], [place] [address]    Special Requests   Final    NONE Performed at [facility], [address] [place], [place] [address]    Gram Stain NO WBC SEEN NO ORGANISMS SEEN   Final   Culture   Final    CULTURE REINCUBATED FOR BETTER GROWTH Performed at [facility], [place] [address], [place], [place] [address]    Report Status PENDING  Incomplete  Aerobic/Anaerobic Culture w Gram Stain (surgical/deep wound)     Status: None (Preliminary result)   Collection Time: 12/07/22  9:18 AM   Specimen:  PATH Other; Tissue  Result Value Ref Range Status   Specimen Description   Final    BONE RIGHT ABOVE THE KNEE AMPUTATION BONE Performed at [facility], [address] [place], [place] [address]    Special Requests   Final    NONE Performed at [facility], [address] [place],  [address]    Gram Stain   Final    FEW WBC PRESENT,BOTH PMN AND MONONUCLEAR NO ORGANISMS SEEN    Culture   Final    NO GROWTH 2 DAYS Performed at [facility], [place] [address], [place],  [address]    Report Status PENDING  Incomplete    Radiology Studies: [place] EKG SITE RITE  Result Date: 12/09/2022 If Site Rite image not attached, placement could not be confirmed due to current cardiac rhythm.   Scheduled Meds:  atorvastatin  80 mg Oral QHS   Chlorhexidine Gluconate Cloth  6 each Topical Q0600   ferrous sulfate  325 mg Oral Q breakfast   isosorbide mononitrate  60 mg Oral Daily   lidocaine  1 patch Transdermal Q24H   metoprolol tartrate  12.5 mg Oral BID   morphine  15 mg Oral Q12H   multivitamin with minerals  1 tablet Oral Daily  mupirocin ointment  1 Application Nasal BID   pantoprazole  40 mg Oral Daily   pregabalin  200 mg Oral TID   Continuous Infusions:  heparin 1,550 Units/hr (12/09/22 0202)   meropenem (MERREM) IV 1 g (12/09/22 0519)     LOS: 6 days    Time spent: 35 mins    Burnell Hurta, MD [facility]   If 7PM-7AM, please contact night-coverage

## 2022-12-09 NOTE — Consult Note (Signed)
[facility] for transition from IV heparin to apixaban Indication: atrial fibrillation and Hx DVT   Patient Measurements: Height: 5' 2"$  (157.5 cm) Weight: 99.8 kg (220 lb) IBW/kg (Calculated) : 50.1 Heparin Dosing Weight: 73.8 kg  Labs: Recent Labs    12/06/22 2104 12/07/22 0524 12/07/22 1059 12/08/22 0003 12/08/22 0331 12/08/22 0558 12/09/22 0344  HGB  --   --   --   --  9.6*  --  9.2*  HCT  --   --   --   --  31.1*  --  30.2*  PLT  --   --   --   --  202  --  210  APTT 65* 82*  --   --   --   --   --   HEPARINUNFRC  --  0.53   < > 0.43  --  0.50 0.65  CREATININE  --  0.61  --   --  0.59  --   --    < > = values in this interval not displayed.     Estimated Creatinine Clearance: 79.5 mL/min (by C-G formula based on SCr of 0.59 mg/dL).   Medical History: Past Medical History:  Diagnosis Date   Abdominal aortic atherosclerosis ([place])    a. 05/2017 CTA abd/pelvis: significant atherosclerotic dzs of the infrarenal abd Ao w/ some mural thrombus. No aneurysm or dissection.   Acute focal ischemia of small intestine (HCC)    Acute right-sided low back pain with right-sided sciatica 06/13/2017   AKI (acute kidney injury) ([place]) 07/29/2017   Anemia    Anginal pain ([place])    a. 08/2012 Lexiscan MV: EF 54%, non ischemia/infarct.   Anxiety and depression    Baker's cyst of knee, right    a. 07/2016 U/S: 4.1 x 1.4 x 2.9 cystic structure in R poplitetal fossa.   Bell's palsy    CAD (coronary artery disease)    CHF (congestive heart failure) ([place])    a.) TTE 06/20/2017: EF 60-65%, no rwma, G1DD, no source of cardiac emboli.   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 05/2017 CTA Abd/pelvis: apparent thrombus or embolus in prox SMA (70-90%); b.  05/2017 catheter directed tPA, mechanical thrombectomy, and stenting of the SMA.   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 b.) rate/rhythm maintained on oral metoprolol tartrate; chronically anticoagulated with standard dose apixaban.   Palpitations    Peripheral edema    Personal history of other malignant neoplasm of skin 06/01/2016   Pneumonia  Pre-diabetes    Primary osteoarthritis of both knees 06/13/2017   SBO (small bowel obstruction) ([place]) 08/06/2017   Skin cancer of nose    Superior mesenteric artery thrombosis (HCC)    Vitamin D deficiency     Medications:  Apixaban 5 mg BID (last patient reported dose was 2/10 PM)  Assessment: Patient is a 62 y/o F with medical history as above and including Afib and Hx DVT on apixaban who is admitted with Rt AKA stump infection / OM. Patient may need surgical intervention. Pharmacy consulted to transition from heparin infusion to apixaban.   Goal of Therapy:  Monitor platelets by anticoagulation protocol: Yes   Plan:   ---stop heparin infusion ---start apixaban 5 mg po BID (meets 0/3 dose-reduction criteria) ---CBC and SCr at least every 7 days  Dallie Piles, PharmD 12/09/2022 2:04 PM

## 2022-12-10 DIAGNOSIS — M86151 Other acute osteomyelitis, right femur: Secondary | ICD-10-CM | POA: Diagnosis not present

## 2022-12-10 LAB — BASIC METABOLIC PANEL
Anion gap: 7 (ref 5–15)
BUN: 14 mg/dL (ref 8–23)
CO2: 29 mmol/L (ref 22–32)
Calcium: 8.5 mg/dL — ABNORMAL LOW (ref 8.9–10.3)
Chloride: 102 mmol/L (ref 98–111)
Creatinine, Ser: 0.65 mg/dL (ref 0.44–1.00)
GFR, Estimated: >60 mL/min (ref >60)
Glucose, Bld: 90 mg/dL (ref 70–99)
Potassium: 4.6 mmol/L (ref 3.5–5.1)
Sodium: 138 mmol/L (ref 135–145)

## 2022-12-10 LAB — CBC
HCT: 36.7 % (ref 36.0–46.0)
Hemoglobin: 11.4 g/dL — ABNORMAL LOW (ref 12.0–15.0)
MCH: 26.7 pg (ref 26.0–34.0)
MCHC: 31.1 g/dL (ref 30.0–36.0)
MCV: 85.9 fL (ref 80.0–100.0)
Platelets: 218 10*3/uL (ref 150–400)
RBC: 4.27 MIL/uL (ref 3.87–5.11)
RDW: 19.5 % — ABNORMAL HIGH (ref 11.5–15.5)
WBC: 6.9 10*3/uL (ref 4.0–10.5)
nRBC: 0 % (ref 0.0–0.2)

## 2022-12-10 LAB — PHOSPHORUS: Phosphorus: 4.1 mg/dL (ref 2.5–4.6)

## 2022-12-10 LAB — MAGNESIUM: Magnesium: 2.3 mg/dL (ref 1.7–2.4)

## 2022-12-10 MED ORDER — SODIUM CHLORIDE 0.9% FLUSH: 10.0000 mL | INTRAVENOUS | Status: DC | PRN

## 2022-12-10 MED ORDER — SODIUM CHLORIDE 0.9% FLUSH
10.0000 mL | Freq: Two times a day (BID) | INTRAVENOUS | Status: DC
  Administered 2022-12-10 – 2022-12-12 (×5): 10 mL

## 2022-12-10 NOTE — Progress Notes (Signed)
PROGRESS NOTE    Carla [name]  [id number] DOB: 12/06/1960 DOA: 12/03/2022  [facility]: [facility]   Brief Narrative:  This 62 y.o. female with medical history significant of CHF, A-fib , peripheral vascular disease , COPD,  hypertension,  hyperlipidemia,  arthritis,  scoliosis and depression.  She complains of Right leg pain, heart fluttering,  dizziness and shortness of breath.  For the past 2-1/2 months,  She is having open wound on her right leg. Her last dose of Eliquis was last night.  Also been very fatigued and sweating.  Has not noticed a fever.  CT scan of the femur shows stump osteomyelitis, complex fluid collection measuring 7 x 6 x 7.4 cm, tract from the collection to the open wound. Patient was started on vancomycin and Zosyn and hospitalist services contacted for further evaluation.  Vascular surgery was consulted. Patient underwent Evacuation of hematoma from right above-knee amputation site, revision of amputation with resection of femur, negative pressure dressing placement.   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  CT A/P: Showed fluid collection around the graft slightly increased in the interval. 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  Likely will need an AKA revision.  Case discussed with vascular surgery.  Eliquis was discontinued.  Started on heparin drip.  Continue empiric antibiotics (vancomycin and Zosyn).  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   Continue adequate pain control. Infectious disease consulted,  antibiotics de-escalated to meropenem,  needs long-term antibiotics. As per vascular surgery patient can be discharged home with wound VAC. Confirmed with Dr. Graylon Good in Dr. Sandford Craze absence,  she recommended 6 weeks of IV meropenem.   PICC line ordered and inserted. OPAT ordered entered.  Hypokalemia: Replaced and resolved.   Peripheral vascular disease: Patient has a predisposition for developing blood clots,  will bridge with heparin drip at this point. Resumed back on Eliquis.   Paroxysmal atrial fibrillation: Eliquis kept on hold. Started on heparin for bridging.  Continue metoprolol for rate control. Resume back to Eliquis.   Hyperlipidemia, unspecified Continue statin.   Chronic pain: Continue her pain regimen.   Obesity, Class III, BMI 40-49.9 (morbid obesity) (HCC) BMI 40.24 with current height and weight in computer   Diarrhea: Improved Hold MiraLAX.   Neuropathy: Continue Lyrica 200 mg three times daily.   GERD (gastroesophageal reflux disease) Continue pantoprazole 40 mg daily.   Essential hypertension: Continue metoprolol 12.5 mg q12hr. BP improved.  Anxiety: Continue Xanax 0.5 mg twice daily as needed for anxiety.  DVT prophylaxis: Heparin IV Code Status: Full code. Family Communication: No family at bed side. Disposition Plan:   Status is: Inpatient Remains inpatient appropriate because: Admitted for acute osteomyelitis of the stump.  Vascular surgery consulted   s/p Evacuation of hematoma from right above-knee amputation site, revision of amputation with resection of femur, negative pressure dressing placement.  Infectious disease consulted,  recommended long-term IV antibiotics.  PICC line inserted,  OPAT orders placed. Anticipated  discharge home 12/11/2022.   Consultants:  Vascular surgery Infectious Diseases.  Procedures: Antimicrobials:  Anti-infectives (From admission, onward)    Start     Dose/Rate Route Frequency Ordered Stop   12/07/22 0833  ceFAZolin (ANCEF) 2-4 GM/100ML-% IVPB       Note to Pharmacy: Jeanene Erb E: cabinet override      12/07/22 0833 12/07/22 0911   12/06/22 2212  ceFAZolin (ANCEF) IVPB 2g/100 mL premix        2 g 200 mL/hr over 30 Minutes Intravenous 30 min pre-op 12/06/22 2212 12/07/22 0847   12/06/22 2200  meropenem (MERREM) 1 g in sodium chloride 0.9 % 100 mL IVPB        1 g 200 mL/hr over 30 Minutes Intravenous Every 8 hours 12/06/22 1634     12/04/22 1000  vancomycin (VANCOREADY) IVPB 1250 mg/250 mL  Status:  Discontinued        1,250 mg 166.7 mL/hr over 90 Minutes Intravenous Every 24 hours 12/03/22 0802 12/06/22 1634   12/03/22 1400  piperacillin-tazobactam (ZOSYN) IVPB 3.375 g        3.375 g 12.5 mL/hr over 240 Minutes Intravenous Every 8 hours 12/03/22 0801 12/06/22 2358   12/03/22 0630  piperacillin-tazobactam (ZOSYN) IVPB 3.375 g        3.375 g 100 mL/hr over 30 Minutes Intravenous  Once 12/03/22 0623 12/03/22 0715   12/03/22 0630  vancomycin (VANCOREADY) IVPB 2000 mg/400 mL        2,000 mg 200 mL/hr over 120 Minutes Intravenous  Once 12/03/22 [id number] 12/03/22 0916      Subjective: Patient was seen and examined at bedside. Overnight events noted.   She is s/p right AKA revision. POD # 4.  Patient reports having significant pain in the extremity area. Pain medications adjusted.  Patient resumed back on Eliquis.  Objective: Vitals:   12/09/22 1945 12/10/22 0424 12/10/22 0455 12/10/22 0733  BP: (!) 104/59 124/80  117/62  Pulse: 83 82  96  Resp: 14 18  18  $ Temp: 97.9 F (36.6 C) 98.5 F (36.9 C)  98.7 F (37.1 C)  TempSrc:      SpO2: 97% 98%  94%  Weight:   100.4 kg   Height:        Intake/Output Summary (Last 24 hours) at 12/10/2022 1427 Last data filed  at 12/10/2022 1042 Gross per 24 hour  Intake 480 ml  Output 2600 ml  Net -2120 ml   Filed Weights   12/03/22 0319 12/10/22 0455  Weight: 99.8 kg 100.4 kg    Examination:  General exam: Appears Comfortable, deconditioned, remains in pain. Respiratory system: CTA bilaterally. Respiratory effort normal.  RR 15 Cardiovascular system: S1 & S2 heard, regular rate and rhythm, no murmur. Gastrointestinal system: Abdomen is  soft, nontender, nondistended, BS+ Central nervous system: Alert and oriented x 3 . No focal neurological deficits. Extremities: S/p right AKA revision.  Wound vac noted. Skin: No rashes, lesions or ulcers Psychiatry: Judgement and insight appear normal. Mood & affect appropriate.     Data Reviewed: I have personally reviewed following labs and imaging studies  CBC: Recent Labs  Lab 12/04/22 0451 12/05/22 0451 12/08/22 0331 12/09/22 0344 12/10/22 0417  WBC 6.6 6.4 9.7 7.2 6.9  HGB 10.6* 10.3* 9.6* 9.2* 11.4*  HCT 34.0* 33.9* 31.1* 30.2* 36.7  MCV 86.3 86.3 87.1 87.0 85.9  PLT 209 198 202 210 218  Basic Metabolic Panel: Recent Labs  Lab 12/04/22 0451 12/05/22 0451 12/06/22 0309 12/07/22 0524 12/08/22 0331 12/10/22 0417  NA 138 139  --   --  133* 138  K 3.3* 3.9  --   --  4.2 4.6  CL 108 112*  --   --  103 102  CO2 21* 20*  --   --  20* 29  GLUCOSE 123* 111*  --   --  196* 90  BUN 15 10  --   --  12 14  CREATININE 0.88 0.78 0.80 0.61 0.59 0.65  CALCIUM 7.9* 8.0*  --   --  8.1* 8.5*  MG  --  1.9  --   --  2.1 2.3  PHOS  --  4.1  --   --  2.7 4.1   GFR: Estimated Creatinine Clearance: 79.8 mL/min (by C-G formula based on SCr of 0.65 mg/dL). Liver Function Tests: No results for input(s): "AST", "ALT", "ALKPHOS", "BILITOT", "PROT", "ALBUMIN" in the last 168 hours.  No results for input(s): "LIPASE", "AMYLASE" in the last 168 hours. No results for input(s): "AMMONIA" in the last 168 hours. Coagulation Profile: Recent Labs  Lab 12/04/22 0451   INR 1.1   Cardiac Enzymes: No results for input(s): "CKTOTAL", "CKMB", "CKMBINDEX", "TROPONINI" in the last 168 hours. BNP (last 3 results) No results for input(s): "PROBNP" in the last 8760 hours. HbA1C: No results for input(s): "HGBA1C" in the last 72 hours. CBG: No results for input(s): "GLUCAP" in the last 168 hours. Lipid Profile: No results for input(s): "CHOL", "HDL", "LDLCALC", "TRIG", "CHOLHDL", "LDLDIRECT" in the last 72 hours. Thyroid Function Tests: No results for input(s): "TSH", "T4TOTAL", "FREET4", "T3FREE", "THYROIDAB" in the last 72 hours. Anemia Panel: No results for input(s): "VITAMINB12", "FOLATE", "FERRITIN", "TIBC", "IRON", "RETICCTPCT" in the last 72 hours. Sepsis Labs: No results for input(s): "PROCALCITON", "LATICACIDVEN" in the last 168 hours.   Recent Results (from the past 240 hour(s))  Resp panel by RT-PCR (RSV, Flu A&B, Covid) Anterior Nasal Swab     Status: None   Collection Time: 12/03/22  3:59 AM   Specimen: Anterior Nasal Swab  Result Value Ref Range Status   SARS Coronavirus 2 by RT PCR NEGATIVE NEGATIVE Final    Comment: (NOTE) SARS-CoV-2 target nucleic acids are NOT DETECTED.  The SARS-CoV-2 RNA is generally detectable in upper respiratory specimens during the acute phase of infection. The lowest concentration of SARS-CoV-2 viral copies this assay can detect is 138 copies/mL. A negative result does not preclude SARS-Cov-2 infection and should not be used as the sole basis for treatment or other patient management decisions. A negative result may occur with  improper specimen collection/handling, submission of specimen other than nasopharyngeal swab, presence of viral mutation(s) within the areas targeted by this assay, and inadequate number of viral copies(<138 copies/mL). A negative result must be combined with clinical observations, patient history, and epidemiological information. The expected result is Negative.  Fact Sheet for  Patients:  EntrepreneurPulse.com.au  Fact Sheet for Healthcare Providers:  IncredibleEmployment.be  This test is no t yet approved or cleared by the [place] FDA and  has been authorized for detection and/or diagnosis of SARS-CoV-2 by FDA under an Emergency Use Authorization (EUA). This EUA will remain  in effect (meaning this test can be used) for the duration of the COVID-19 declaration under Section 564(b)(1) of the Act, 21 U.S.C.section 360bbb-3(b)(1), unless the authorization is terminated  or revoked sooner.       Influenza A  by PCR NEGATIVE NEGATIVE Final   Influenza B by PCR NEGATIVE NEGATIVE Final    Comment: (NOTE) The Xpert Xpress SARS-CoV-2/FLU/RSV plus assay is intended as an aid in the diagnosis of influenza from Nasopharyngeal swab specimens and should not be used as a sole basis for treatment. Nasal washings and aspirates are unacceptable for Xpert Xpress SARS-CoV-2/FLU/RSV testing.  Fact Sheet for Patients: EntrepreneurPulse.com.au  Fact Sheet for Healthcare Providers: IncredibleEmployment.be  This test is not yet approved or cleared by the [place] FDA and has been authorized for detection and/or diagnosis of SARS-CoV-2 by FDA under an Emergency Use Authorization (EUA). This EUA will remain in effect (meaning this test can be used) for the duration of the COVID-19 declaration under Section 564(b)(1) of the Act, 21 U.S.C. section 360bbb-3(b)(1), unless the authorization is terminated or revoked.     Resp Syncytial Virus by PCR NEGATIVE NEGATIVE Final    Comment: (NOTE) Fact Sheet for Patients: EntrepreneurPulse.com.au  Fact Sheet for Healthcare Providers: IncredibleEmployment.be  This test is not yet approved or cleared by the [place] FDA and has been authorized for detection and/or diagnosis of SARS-CoV-2 by FDA under an Emergency  Use Authorization (EUA). This EUA will remain in effect (meaning this test can be used) for the duration of the COVID-19 declaration under Section 564(b)(1) of the Act, 21 U.S.C. section 360bbb-3(b)(1), unless the authorization is terminated or revoked.  Performed at [facility], [address] [place], [place] [address]   Aerobic/Anaerobic Culture w Gram Stain (surgical/deep wound)     Status: None   Collection Time: 12/03/22  9:56 AM   Specimen: Abscess; Wound  Result Value Ref Range Status   Specimen Description   Final    ABSCESS Performed at [facility], [address] [place], [place] [address]    Special Requests   Final    Normal Performed at [facility], [address] [place], [place] [address]    Gram Stain   Final    MODERATE WBC PRESENT, PREDOMINANTLY PMN NO ORGANISMS SEEN    Culture   Final    RARE ESCHERICHIA COLI Confirmed Extended Spectrum Beta-Lactamase Producer (ESBL).  In bloodstream infections from ESBL organisms, carbapenems are preferred over piperacillin/tazobactam. They are shown to have a lower risk of mortality. RARE ENTEROCOCCUS FAECALIS RARE PREVOTELLA SPECIES BETA LACTAMASE POSITIVE Performed at [facility], [place] [address] [place],  [address]    Report Status 12/09/2022 FINAL  Final   Organism ID, Bacteria ESCHERICHIA COLI  Final   Organism ID, Bacteria ENTEROCOCCUS FAECALIS  Final      Susceptibility   Escherichia coli - MIC*    AMPICILLIN >=32 RESISTANT Resistant     CEFEPIME >=32 RESISTANT Resistant     CEFTAZIDIME RESISTANT Resistant     CEFTRIAXONE >=64 RESISTANT Resistant     CIPROFLOXACIN >=4 RESISTANT Resistant     GENTAMICIN <=1 SENSITIVE Sensitive     IMIPENEM <=0.25 SENSITIVE Sensitive     TRIMETH/SULFA >=320 RESISTANT Resistant     AMPICILLIN/SULBACTAM 16 INTERMEDIATE Intermediate     PIP/TAZO <=4 SENSITIVE Sensitive     * RARE ESCHERICHIA COLI   Enterococcus faecalis - MIC*     AMPICILLIN <=2 SENSITIVE Sensitive     VANCOMYCIN 1 SENSITIVE Sensitive     GENTAMICIN SYNERGY SENSITIVE Sensitive     * RARE ENTEROCOCCUS FAECALIS  Surgical PCR screen     Status: Abnormal   Collection Time: 12/07/22  2:38 AM   Specimen:  Nasal Mucosa; Nasal Swab  Result Value Ref Range Status   MRSA, PCR POSITIVE (A) NEGATIVE Final    Comment: CRITICAL RESULT CALLED TO, READ BACK BY AND VERIFIED WITH: ERWIN MACROHON RN @0512$  12/07/22 ASW    Staphylococcus aureus POSITIVE (A) NEGATIVE Final    Comment: CRITICAL RESULT CALLED TO, READ BACK BY AND VERIFIED WITH: Erlene Quan RN @0512$  12/07/22 ASW (NOTE) The Xpert SA Assay (FDA approved for NASAL specimens in patients 5 years of age and older), is one component of a comprehensive surveillance program. It is not intended to diagnose infection nor to guide or monitor treatment. Performed at [facility], [address] [place], [place] [address]   Aerobic/Anaerobic Culture w Gram Stain (surgical/deep wound)     Status: None (Preliminary result)   Collection Time: 12/07/22  9:10 AM   Specimen: PATH Other; Body Fluid  Result Value Ref Range Status   Specimen Description   Final    WOUND RIGHT ABOVE THE KNEE Performed at [facility], [address] [place], [place] [address]    Special Requests   Final    NONE Performed at [facility], [address] [place], [place] [address]    Gram Stain   Final    NO WBC SEEN NO ORGANISMS SEEN Performed at [facility], [place] [address], [place], [place] [address]    Culture   Final    RARE METHICILLIN RESISTANT STAPHYLOCOCCUS AUREUS NO ANAEROBES ISOLATED; CULTURE IN PROGRESS FOR 5 DAYS    Report Status PENDING  Incomplete   Organism ID, Bacteria METHICILLIN RESISTANT STAPHYLOCOCCUS AUREUS  Final      Susceptibility   Methicillin resistant staphylococcus aureus - MIC*    CIPROFLOXACIN >=8 RESISTANT Resistant     ERYTHROMYCIN >=8 RESISTANT  Resistant     GENTAMICIN <=0.5 SENSITIVE Sensitive     OXACILLIN >=4 RESISTANT Resistant     TETRACYCLINE >=16 RESISTANT Resistant     VANCOMYCIN 1 SENSITIVE Sensitive     TRIMETH/SULFA <=10 SENSITIVE Sensitive     CLINDAMYCIN >=8 RESISTANT Resistant     RIFAMPIN <=0.5 SENSITIVE Sensitive     Inducible Clindamycin NEGATIVE Sensitive     * RARE METHICILLIN RESISTANT STAPHYLOCOCCUS AUREUS  Aerobic/Anaerobic Culture w Gram Stain (surgical/deep wound)     Status: None (Preliminary result)   Collection Time: 12/07/22  9:18 AM   Specimen: PATH Other; Tissue  Result Value Ref Range Status   Specimen Description   Final    BONE RIGHT ABOVE THE KNEE AMPUTATION BONE Performed at [facility], [address], [place], [place] [address]    Special Requests   Final    NONE Performed at [facility], [address] [place], [place] [address]    Gram Stain   Final    FEW WBC PRESENT,BOTH PMN AND MONONUCLEAR NO ORGANISMS SEEN    Culture   Final    NO GROWTH 2 DAYS NO ANAEROBES ISOLATED; CULTURE IN PROGRESS FOR 5 DAYS Performed at [facility], [address], [place],  [address]    Report Status PENDING  Incomplete    Radiology Studies: [place] EKG SITE RITE  Result Date: 12/09/2022 If Site Rite image not attached, placement could not be confirmed due to current cardiac rhythm.   Scheduled Meds:  apixaban  5 mg Oral BID   atorvastatin  80 mg Oral QHS   Chlorhexidine Gluconate Cloth  6 each Topical Q0600   docusate sodium  100 mg Oral Daily   ferrous sulfate  325 mg Oral Q breakfast   isosorbide mononitrate  60 mg Oral Daily   lidocaine  1 patch Transdermal Q24H   metoprolol tartrate  12.5 mg Oral BID   morphine  15 mg Oral Q12H   multivitamin with minerals  1 tablet Oral Daily   mupirocin ointment  1 Application Nasal BID   pantoprazole  40 mg Oral Daily   pregabalin  200 mg Oral TID   senna  1 tablet Oral Daily   sodium chloride flush  10-40 mL  Intracatheter Q12H   Continuous Infusions:  meropenem (MERREM) IV 1 g (12/10/22 0501)     LOS: 7 days    Time spent: 35 mins    Fay Swider, MD [facility]   If 7PM-7AM, please contact night-coverage

## 2022-12-10 NOTE — Progress Notes (Signed)
Peripherally Inserted Central Catheter Placement  The IV Nurse has discussed with the patient and/or persons authorized to consent for the patient, the purpose of this procedure and the potential benefits and risks involved with this procedure.  The benefits include less needle sticks, lab draws from the catheter, and the patient may be discharged home with the catheter. Risks include, but not limited to, infection, bleeding, blood clot (thrombus formation), and puncture of an artery; nerve damage and irregular heartbeat and possibility to perform a PICC exchange if needed/ordered by physician.  Alternatives to this procedure were also discussed.  Bard Power PICC patient education guide, fact sheet on infection prevention and patient information card has been provided to patient /or left at bedside.  Pt A&O x4, signed consent with difficulty writing.  Spoke with dtr on telephone, also agreeable to PICC placement, stating all questions sufficiently answered last night by MD.  PICC Placement Documentation  PICC Single Lumen 12/10/22 Right Brachial 38 cm 0 cm (Active)  Indication for Insertion or Continuance of Line Home intravenous therapies (PICC only) 12/10/22 1146  Exposed Catheter (cm) 0 cm 12/10/22 1146  Site Assessment Clean, Dry, Intact 12/10/22 1146  Line Status Flushed;Saline locked;Blood return noted 12/10/22 1146  Dressing Type Transparent;Securing device 12/10/22 1146  Dressing Status Antimicrobial disc in place;Clean, Dry, Intact 12/10/22 1146  Safety Lock Not Applicable [id number]  Line Care Tubing changed;Connections checked and tightened 12/10/22 1146  Line Adjustment (NICU/IV Team Only) No 12/10/22 1146  Dressing Intervention New dressing 12/10/22 1146  Dressing Change Due 12/17/22 12/10/22 1146       Rolena Infante 12/10/2022, 11:47 AM

## 2022-12-10 NOTE — Progress Notes (Signed)
  OUTPATIENT  PARENTERAL ANTIBIOTIC THERAPY (OPAT)  Indication: femur osteomyelitis  Regimen: Meropenem 1g IV every 8 hours Duration: 6 wk End Date:January 19, 2023  IV antibiotic discharge orders are pended.  To discharging provider:  please sign these orders via discharge navigator,   Select New Orders & click on the button choice - Manage This Unsigned Work.    Thank you for allowing pharmacy to be a part of this patient's care.  Pernell Dupre, PharmD, BCPS Clinical Pharmacist 12/10/2022 8:58 AM

## 2022-12-11 DIAGNOSIS — R7881 Bacteremia: Secondary | ICD-10-CM

## 2022-12-11 DIAGNOSIS — T8743 Infection of amputation stump, right lower extremity: Secondary | ICD-10-CM | POA: Diagnosis not present

## 2022-12-11 DIAGNOSIS — Z89611 Acquired absence of right leg above knee: Secondary | ICD-10-CM | POA: Diagnosis not present

## 2022-12-11 DIAGNOSIS — M86151 Other acute osteomyelitis, right femur: Secondary | ICD-10-CM | POA: Diagnosis not present

## 2022-12-11 LAB — CBC
HCT: 33.9 % — ABNORMAL LOW (ref 36.0–46.0)
Hemoglobin: 10.6 g/dL — ABNORMAL LOW (ref 12.0–15.0)
MCH: 26.7 pg (ref 26.0–34.0)
MCHC: 31.3 g/dL (ref 30.0–36.0)
MCV: 85.4 fL (ref 80.0–100.0)
Platelets: 236 10*3/uL (ref 150–400)
RBC: 3.97 MIL/uL (ref 3.87–5.11)
RDW: 19.3 % — ABNORMAL HIGH (ref 11.5–15.5)
WBC: 7.4 10*3/uL (ref 4.0–10.5)
nRBC: 0 % (ref 0.0–0.2)

## 2022-12-11 LAB — BASIC METABOLIC PANEL
Anion gap: 10 (ref 5–15)
BUN: 16 mg/dL (ref 8–23)
CO2: 30 mmol/L (ref 22–32)
Calcium: 8.5 mg/dL — ABNORMAL LOW (ref 8.9–10.3)
Chloride: 92 mmol/L — ABNORMAL LOW (ref 98–111)
Creatinine, Ser: 0.65 mg/dL (ref 0.44–1.00)
GFR, Estimated: >60 mL/min (ref >60)
Glucose, Bld: 130 mg/dL — ABNORMAL HIGH (ref 70–99)
Potassium: 4.2 mmol/L (ref 3.5–5.1)
Sodium: 132 mmol/L — ABNORMAL LOW (ref 135–145)

## 2022-12-11 LAB — CK: Total CK: 12 U/L — ABNORMAL LOW (ref 38–234)

## 2022-12-11 LAB — MAGNESIUM: Magnesium: 2.4 mg/dL (ref 1.7–2.4)

## 2022-12-11 LAB — PHOSPHORUS: Phosphorus: 4.7 mg/dL — ABNORMAL HIGH (ref 2.5–4.6)

## 2022-12-11 MED ORDER — SODIUM CHLORIDE 0.9 % IV SOLN
500.0000 mg | Freq: Every day | INTRAVENOUS | Status: DC
  Administered 2022-12-11 – 2022-12-12 (×2): 500 mg via INTRAVENOUS
  Filled 2022-12-11 (×3): qty 10

## 2022-12-11 MED ORDER — CHLORHEXIDINE GLUCONATE CLOTH 2 % EX PADS
6.0000 | MEDICATED_PAD | Freq: Every day | CUTANEOUS | Status: DC
  Administered 2022-12-11 – 2022-12-13 (×3): 6 via TOPICAL

## 2022-12-11 MED ORDER — OXYCODONE-ACETAMINOPHEN 5-325 MG PO TABS
1.0000 | ORAL_TABLET | ORAL | Status: DC | PRN
  Administered 2022-12-11 – 2022-12-12 (×6): 1 via ORAL
  Filled 2022-12-11 (×6): qty 1

## 2022-12-11 MED ORDER — SODIUM CHLORIDE 0.9 % IV SOLN
1.0000 g | Freq: Every day | INTRAVENOUS | Status: DC
  Administered 2022-12-12 – 2022-12-13 (×2): 1000 mg via INTRAVENOUS
  Filled 2022-12-11 (×2): qty 1

## 2022-12-11 NOTE — Evaluation (Signed)
Occupational Therapy Evaluation Patient Details Name: ABBEE [name] MRN: [id number] DOB: 06/23/1961 Today's Date: 12/11/2022   History of Present Illness 62 y.o. female with medical history significant of R AKA, CHF, A-fib, peripheral vascular disease, COPD, hypertension, hyperlipidemia, arthritis, scoliosis and depression. She complains of leg pain, heart fluttering, dizziness and shortness of breath. For the past 2-1/2 months she is having open wound on her right leg. CT scan of the femur shows stump osteomyelitis. Now s/p R AKA revision and I&D on 12/07/22.   Clinical Impression   Patient presenting with decreased independence in self care, balance, functional mobility/transfers, and endurance. PTA pt lived with children, received assistance as needed for ADLs/IADLs, and required Min A for squat/stand pivot transfer to/from [place]. Pt currently functioning at Max A for rolling and Max-total A for self-care tasks. Pt's activity tolerance limited by RLE pain this date. Pt tearful t/o. RN aware and gave pain meds during session. Pt will benefit from acute OT to increase overall independence in the areas of ADLs and functional mobility in order to safely discharge to next venue of care. Upon hospital discharge, recommend STR to maximize pt safety and return to PLOF.    Recommendations for follow up therapy are one component of a multi-disciplinary discharge planning process, led by the attending physician.  Recommendations may be updated based on patient status, additional functional criteria and insurance authorization.   Follow Up Recommendations  Skilled nursing-short term rehab (<3 hours/day)     Assistance Recommended at Discharge Frequent or constant Supervision/Assistance  Patient can return home with the following Two people to help with walking and/or transfers;Two people to help with bathing/dressing/bathroom;Assistance with cooking/housework;Assist for transportation;Help with stairs or ramp  for entrance    Functional Status Assessment  Patient has had a recent decline in their functional status and demonstrates the ability to make significant improvements in function in a reasonable and predictable amount of time.  Equipment Recommendations  Other (comment) (defer to next venue of care)    Recommendations for Other Services       Precautions / Restrictions Precautions Precautions: Fall Precaution Comments: R AKA, wound vac RLE Restrictions Weight Bearing Restrictions: No      Mobility Bed Mobility Overal bed mobility: Needs Assistance Bed Mobility: Rolling Rolling: Max assist         General bed mobility comments: Max A to initiate rolling then able to maintain using bed rails    Transfers                   General transfer comment: unsafe/unable to attempt at this time      Balance       Sitting balance - Comments: NT this date, pt deferred 2/2 RLE pain                                   ADL either performed or assessed with clinical judgement   ADL Overall ADL's : Needs assistance/impaired Eating/Feeding: Set up;Bed level           Lower Body Bathing: Bed level;Total assistance   Upper Body Dressing : Maximal assistance;Bed level Upper Body Dressing Details (indicate cue type and reason): to don/doff gown Lower Body Dressing: Bed level;Total assistance       Toileting- Clothing Manipulation and Hygiene: Bed level;Total assistance         General ADL Comments: Max-Total A for all self-care tasks this  date     Vision Patient Visual Report: No change from baseline       Perception     Praxis      Pertinent Vitals/Pain Pain Assessment Pain Assessment: Faces Faces Pain Scale: Hurts worst Pain Location: RLE Pain Descriptors / Indicators: Crying, Grimacing, Guarding, Sore, Constant Pain Intervention(s): Limited activity within patient's tolerance, RN gave pain meds during session, Monitored during  session, Repositioned     Hand Dominance Right   Extremity/Trunk Assessment Upper Extremity Assessment Upper Extremity Assessment: Generalized weakness   Lower Extremity Assessment Lower Extremity Assessment: Generalized weakness;RLE deficits/detail RLE Deficits / Details: previous AKA       Communication Communication Communication: No difficulties   Cognition Arousal/Alertness: Awake/alert Behavior During Therapy: Anxious Overall Cognitive Status: No family/caregiver present to determine baseline cognitive functioning                                 General Comments: pt tearful t/o due to pain and reported being upset about not receiving pain meds when she feels like she needs it, pt fearful of any mobility due to pain     General Comments  RLE wound vac in place    Exercises Other Exercises Other Exercises: OT provided education re: role of OT, OT POC, post acute recs, sitting up for all meals, EOB/OOB mobility with assistance, home/fall safety    Shoulder Instructions      Home Living Family/patient expects to be discharged to:: Private residence Living Arrangements: Children Available Help at Discharge: Family;Available PRN/intermittently Type of Home: Mobile home Home Access: Level entry     Home Layout: One level     Bathroom Shower/Tub: Teacher, early years/pre: Standard     Home Equipment: Wheelchair - manual;Shower seat          Prior Functioning/Environment Prior Level of Function : Needs assist       Physical Assist : Mobility (physical);ADLs (physical) Mobility (physical): Bed mobility;Transfers ADLs (physical): Bathing;Dressing;IADLs;Toileting Mobility Comments: Pt requires min A for stand/squat pivot transfers to/from wheelchair; requires PRN assist for w/c mobility in the home; pt states that she has not been able to stand since her heart surgery several weeks ago. ADLs Comments: Daughter assists with ADLs/IADLs  as needed        OT Problem List: Decreased strength;Decreased range of motion;Decreased activity tolerance;Impaired balance (sitting and/or standing);Pain      OT Treatment/Interventions: Self-care/ADL training;Therapeutic exercise;Therapeutic activities;Energy conservation;DME and/or AE instruction;Patient/family education;Balance training;Cognitive remediation/compensation    OT Goals(Current goals can be found in the care plan section) Acute Rehab OT Goals Patient Stated Goal: reduce pain OT Goal Formulation: With patient Time For Goal Achievement: 12/25/22 Potential to Achieve Goals: Fair   OT Frequency: Min 2X/week    Co-evaluation              AM-PAC OT "6 Clicks" Daily Activity     Outcome Measure Help from another person eating meals?: A Little Help from another person taking care of personal grooming?: A Little Help from another person toileting, which includes using toliet, bedpan, or urinal?: Total Help from another person bathing (including washing, rinsing, drying)?: Total Help from another person to put on and taking off regular upper body clothing?: A Lot Help from another person to put on and taking off regular lower body clothing?: Total 6 Click Score: 11   End of Session Nurse Communication: Mobility status;Patient requests pain meds  Activity Tolerance: Patient limited by pain Patient left: in bed;with call bell/phone within reach;with bed alarm set  OT Visit Diagnosis: Other abnormalities of gait and mobility (R26.89);Muscle weakness (generalized) (M62.81);Pain Pain - Right/Left: Right Pain - part of body: Leg                Time: [id number] OT Time Calculation (min): 26 min Charges:  OT General Charges $OT Visit: 1 Visit OT Evaluation $OT Eval Moderate Complexity: 1 Mod  [facility] MS, OTR/L ascom [phone]  12/11/22, 5:18 PM

## 2022-12-11 NOTE — Progress Notes (Addendum)
Progress Note    12/11/2022 4:07 PM 4 Days Post-Op  Subjective:  Carla [name] is a 62 y.o. female who presents with a persistent wound in her right above-knee amputation and an area of previous hematoma evacuation above the original incision.  Imaging studies suggested osteomyelitis of the femur and an abscess around the femur.  She is now postop day 4 from incision and drainage of clotted and nonclotted blood products.   On exam this morning the patient is resting comfortably in bed.  She does complain of pain to her right above-the-knee amputation site.  Wound VAC is in place and working properly.  All remained stable.  No other complaints.   Vitals:   12/11/22 0629 12/11/22 0822  BP: 99/63 108/64  Pulse: 83 93  Resp: 18 17  Temp: 98.4 F (36.9 C) 98.7 F (37.1 C)  SpO2: 93% 94%   Physical Exam: Cardiac:  RRR Lungs: Clear to auscultation. Respiratory effort normal. Respiration rate 16. No wheezing rhonchi or rales to note.  Incisions:  Right Stump AKA. Wound vac in place Extremities:  Right AKA  Abdomen:   Positive bowel sounds, soft, nontender abdomen.  Neurologic:  Alert and oriented x 3.  Neuro intact.  Answers all questions appropriately.   CBC    Component Value Date/Time   WBC 7.4 12/11/2022 0355   RBC 3.97 12/11/2022 0355   HGB 10.6 (L) 12/11/2022 0355   HGB 13.4 02/12/2015 1137   HCT 33.9 (L) 12/11/2022 0355   HCT 40.9 02/12/2015 1137   PLT 236 12/11/2022 0355   PLT 272 02/12/2015 1137   MCV 85.4 12/11/2022 0355   MCV 90 02/12/2015 1137   MCH 26.7 12/11/2022 0355   MCHC 31.3 12/11/2022 0355   RDW 19.3 (H) 12/11/2022 0355   RDW 14.8 (H) 02/12/2015 1137   LYMPHSABS 2.7 12/03/2022 0359   LYMPHSABS 1.7 08/27/2014 1145   MONOABS 0.8 12/03/2022 0359   MONOABS 0.4 08/27/2014 1145   EOSABS 0.1 12/03/2022 0359   EOSABS 0.0 08/27/2014 1145   BASOSABS 0.1 12/03/2022 0359   BASOSABS 0.0 08/27/2014 1145    BMET    Component Value Date/Time   NA 132 (L)  12/11/2022 0355   NA 140 02/12/2015 1137   K 4.2 12/11/2022 0355   K 2.9 (L) 02/12/2015 1137   CL 92 (L) 12/11/2022 0355   CL 107 02/12/2015 1137   CO2 30 12/11/2022 0355   CO2 27 02/12/2015 1137   GLUCOSE 130 (H) 12/11/2022 0355   GLUCOSE 120 (H) 02/12/2015 1137   BUN 16 12/11/2022 0355   BUN 15 02/12/2015 1137   CREATININE 0.65 12/11/2022 0355   CREATININE 0.78 02/12/2015 1137   CALCIUM 8.5 (L) 12/11/2022 0355   CALCIUM 9.0 02/12/2015 1137   GFRNONAA >60 12/11/2022 0355   GFRNONAA >60 02/12/2015 1137   GFRAA >60 11/23/2019 1330   GFRAA >60 02/12/2015 1137    INR    Component Value Date/Time   INR 1.1 12/04/2022 0451   INR 0.8 07/23/2012 0433     Intake/Output Summary (Last 24 hours) at 12/11/2022 1607 Last data filed at 12/11/2022 1256 Gross per 24 hour  Intake --  Output 2750 ml  Net -2750 ml     Assessment/Plan:  62 y.o. female is s/p  62 y.o. female is s/p incision and drainage of clotted and non clotted blood products.  4 Days Post-Op   Plan: Vascular surgery is okay with patient being discharged today. Patient awaiting SNIF Placement  Continue on Antibiotics IV Per ID: Ertapenem 1GM Q 24 and Daptomycin 500 mg Q24  for 4-6 weeks Through PICC line. Weekly CBC, CMPand CK. Picc Line to be pulled at completion of antibiotic therapy.  No abscess or pus was drained from patient's right AKA.  What appears to have been read on imaging as an abscess was a pocket containing clotted and none clotted blood products. Patient's white blood cell count this morning was normal at 7.4.  Hemoglobin and hematocrit remained stable at 10.6 and 33.9.  Initial microbiology of the right femur bone that was taken during surgery shows no organisms or growth at this time. Patient will need to go home with wound VAC in place and changes by home health 2 times a week. Patient will need to continue on Eliquis 5 mg twice daily and aspirin 81 mg daily.   Patient to follow-up with vascular surgery  in clinic in 1 month for overall evaluation.     DVT prophylaxis: ASA 81 mg daily, Eliquis 5 mg twice daily   Drema Pry [facility] 12/11/2022 4:07 PM

## 2022-12-11 NOTE — Hospital Course (Signed)
This 62 y.o. female with medical history significant of CHF, A-fib , peripheral vascular disease , COPD,  hypertension,  hyperlipidemia,  arthritis,  scoliosis and depression.  She complains of Right leg pain, heart fluttering,  dizziness and shortness of breath.  For the past 2-1/2 months,  She is having open wound on her right leg. Her last dose of Eliquis was last night.  Also been very fatigued and sweating.  Has not noticed a fever.  CT scan of the femur shows stump osteomyelitis, complex fluid collection measuring 7 x 6 x 7.4 cm, tract from the collection to the open wound. Patient was started on vancomycin and Zosyn and hospitalist services contacted for further evaluation.  Vascular surgery was consulted. Patient underwent Evacuation of hematoma from right above-knee amputation site, revision of amputation with resection of femur, negative pressure dressing placement.  Wound culture came back with E. coli and Enterobacter, repeat culture has MRSA.  Antibiotic changed to ertapenem and daptomycin. Due to significant weakness, patient will need nursing home placement.

## 2022-12-11 NOTE — Treatment Plan (Signed)
Diagnosis: Rt AKA stump infection  with polymicrobes- MRSA, enterococcus, ESBL e.coli Baseline Creatinine < 1   Allergies  Allergen Reactions   Gabapentin     Tremors    Bactrim [Sulfamethoxazole-Trimethoprim] Itching    OPAT Orders Ertapenem 1 gram Iv every 24 hours Daptomycin 547m IV every 24 hrs Duration: 4 -6 weeks End Date: 01/21/23   [facility] Per Protocol:  Labs weekly while on IV antibiotics: X__ CBC with differential X__ CMP _X_ CK  _X_ Please pull PIC at completion of IV antibiotics   Fax weekly lab results  promptly to [phone]  Clinic Follow Up Appt: 01/04/23 at 10.45 AM   Call [phone] any questions

## 2022-12-11 NOTE — Consult Note (Signed)
[place] Nurse Consult Note: Reason for Consult:chronic, full thickness wound to right lower abdomen Wound type:infectious Pressure Injury POA: N/A Measurement:Per nursing flow sheet, 0.5cm round Wound bed:red with yellow fibrinous slough, moist Drainage (amount, consistency, odor) small to moderate Periwound:intact Dressing procedure/placement/frequency: I have provided Nursing with guidance for the care of this lesion while in house using a soap and water cleanse, rinse and dry. The dressing wil be with a silver hydrofiber for absorption and donation of antimicrobial properties and will be performed daily. Post discharge, recommend patient follow up with PCP regarding this wound.  [place] nursing team will not follow, but will remain available to this patient, the nursing and medical teams.  Please re-consult if needed.  Thank you for inviting [place] to participate in this patient's Plan of Care.  Maudie Flakes, MSN, RN, CNS, [place], Serita Grammes, [employer], [employer] phone:  [phone]

## 2022-12-11 NOTE — Progress Notes (Signed)
PHARMACY CONSULT NOTE FOR:  OUTPATIENT  PARENTERAL ANTIBIOTIC THERAPY (OPAT)  Indication: Polymicrobial AKA stump infection (MRSA< ESBL E coli, and E faecalis) Regimen:  Daptomycin 546m IV q24h Ertapenem 1gm IV q24h End date: 01/21/2023  Labs - Once weekly:  CBC/D, CMP, CPK Please pull PIC at completion of IV antibiotics Fax weekly labs to [phone] IV antibiotic discharge orders are pended. To discharging provider:  please sign these orders via discharge navigator,  Select New Orders & click on the button choice - Manage This Unsigned Work.     Thank you for allowing pharmacy to be a part of this patient's care.  DDoreene Eland PharmD, BCPS, BCIDP Work Cell: [phone]/19/2024 2:28 PM

## 2022-12-11 NOTE — Progress Notes (Addendum)
Progress Note   Patient: Carla [name] [id number] DOB: January 18, 1961 DOA: 12/03/2022     8 DOS: the patient was seen and examined on 12/11/2022   Brief hospital course: This 62 y.o. female with medical history significant of CHF, A-fib , peripheral vascular disease , COPD,  hypertension,  hyperlipidemia,  arthritis,  scoliosis and depression.  She complains of Right leg pain, heart fluttering,  dizziness and shortness of breath.  For the past 2-1/2 months,  She is having open wound on her right leg. Her last dose of Eliquis was last night.  Also been very fatigued and sweating.  Has not noticed a fever.  CT scan of the femur shows stump osteomyelitis, complex fluid collection measuring 7 x 6 x 7.4 cm, tract from the collection to the open wound. Patient was started on vancomycin and Zosyn and hospitalist services contacted for further evaluation.  Vascular surgery was consulted. Patient underwent Evacuation of hematoma from right above-knee amputation site, revision of amputation with resection of femur, negative pressure dressing placement.    Principal Problem:   Acute osteomyelitis of right thigh (HCC) Active Problems:   Hypokalemia   PVD (peripheral vascular disease) (HCC)   Paroxysmal atrial fibrillation (HCC)   Hyperlipidemia, unspecified   Chronic pain   Obesity, Class III, BMI 40-49.9 (morbid obesity) ([place])   Essential hypertension   GERD (gastroesophageal reflux disease)   Neuropathy   Diarrhea   Amputation stump infection ([place])   Bacteremia No bacteremia  Assessment and Plan: Acute osteomyelitis of right thigh: Due to E. coli, Enterobacter faecalis and MRSA. She presented with Right stump infection.  CT A/P: Showed fluid collection around the graft slightly increased in the interval. 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  Continue empiric antibiotics (vancomycin and Zosyn).  s/p Evacuation of hematoma from right above-knee  amputation site, revision of amputation with resection of femur, negative pressure dressing placement.   Wound culture came back with E. coli and Enterobacter, repeat culture has MRSA.  Antibiotic changed to ertapenem and daptomycin per infectious disease. Patient has a PICC line placed, but home health could not set IV infusion until Wednesday.    Abdominal wall wound.   Followed by wound care.  Generalized weakness with severe debility. Has not been able to get out of bed while in the hospital, unlikely patient will be going home.  Will obtain PT/OT, most likely she will need to go to nursing home.   Hypokalemia: Resolved.   Peripheral vascular disease: Resumed back on Eliquis.   Paroxysmal atrial fibrillation: Continue Eliquis.   Hyperlipidemia, unspecified Continue statin.   Chronic pain: Continue her pain regimen.   Obesity, Class III, BMI 40-49.9 (morbid obesity) (HCC) BMI 40.24 with current height and weight in computer   Diarrhea: Improved Hold MiraLAX.   Neuropathy: Continue Lyrica 200 mg three times daily.   GERD (gastroesophageal reflux disease) Continue pantoprazole 40 mg daily.   Essential hypertension: Continue metoprolol 12.5 mg q12hr. BP improved.   Anxiety: Continue Xanax 0.5 mg twice daily as needed for anxiety      Subjective:  Patient has some weakness and pain, requiring as needed pain medicine.  Physical Exam: Vitals:   12/10/22 1549 12/10/22 2107 12/11/22 0629 12/11/22 0822  BP: (!) 94/54 103/75 99/63 108/64  Pulse: 92 91 83 93  Resp: 16 16 18 17  $ Temp: 98.4 F (36.9 C) 99.2 F (37.3 C) 98.4 F (36.9 C) 98.7 F (37.1 C)  TempSrc: Oral     SpO2: 96% 97% 93% 94%  Weight:      Height:       General exam: Appears calm and comfortable  Respiratory system: Clear to auscultation. Respiratory effort normal. Cardiovascular system: S1 & S2 heard, RRR. No JVD, murmurs, rubs, gallops or clicks. No pedal edema. Gastrointestinal system:  Abdomen is nondistended, soft and nontender. No organomegaly or masses felt. Normal bowel sounds heard. Central nervous system: Alert and oriented. No focal neurological deficits. Extremities: Right AKA. Skin: No rashes, lesions or ulcers Psychiatry:  Mood & affect appropriate.    Data Reviewed:  Lab results reviewed  Family Communication: daughter updated, all questions answered.  Disposition: Status is: Inpatient Remains inpatient appropriate because: unsafe discharge     Time spent: 50 minutes  Author: Sharen Hones, MD 12/11/2022 2:02 PM  For on call review [email or web address].

## 2022-12-11 NOTE — TOC Progression Note (Signed)
Transition of Care [facility]) - Progression Note    Patient Details  Name: Carla [name] MRN: [id number] Date of Birth: 03/31/1961  [facility] CM/SW Contact  Beverly Sessions, RN Phone Number: 12/11/2022, 3:09 PM  Clinical Narrative:    Patient will require daptomycin 578m IV daily, ertapenem 1gm IV daily until 3/31  Daughter requesting SNF placement.  Patient in agreement.  I have requested MD to order a PT eval Fl2 sent for signature Bed search initiated         Expected Discharge Plan and Services                                               Social Determinants of Health (SDOH) Interventions SDOH Screenings   Food Insecurity: No Food Insecurity (12/04/2022)  Housing: Low Risk  (12/04/2022)  Transportation Needs: No Transportation Needs (12/04/2022)  Utilities: Not At Risk (12/04/2022)  Depression (PHQ2-9): Low Risk  (02/02/2021)  Tobacco Use: Medium Risk (12/07/2022)    Readmission Risk Interventions    12/06/2022    1:31 PM 07/25/2022   10:49 AM 05/05/2022   10:52 AM  Readmission Risk Prevention Plan  Transportation Screening Complete Complete Complete  Medication Review (Press photographer Complete Complete Complete  PCP or Specialist appointment within 3-5 days of discharge  Complete Complete  HRI or Home Care Consult  Complete   SW Recovery Care/Counseling Consult Complete Not Complete Complete  SW Consult Not Complete Comments  NA   Palliative Care Screening Not Applicable  Not [place] Patient Refused Patient Refused

## 2022-12-11 NOTE — Progress Notes (Signed)
   Date of Admission:  12/03/2022      ID: Carla [name] is a 62 y.o. female  Principal Problem:   Acute osteomyelitis of right thigh ([place]) Active Problems:   Essential hypertension   GERD (gastroesophageal reflux disease)   Obesity, Class III, BMI 40-49.9 (morbid obesity) (HCC)   Paroxysmal atrial fibrillation (HCC)   Chronic pain   PVD (peripheral vascular disease) (HCC)   Hypokalemia   Hyperlipidemia, unspecified   Neuropathy   Diarrhea   Amputation stump infection (HCC)    Subjective: Pt is awake and alert Says she is feeling okay  Medications:   apixaban  5 mg Oral BID   atorvastatin  80 mg Oral QHS   docusate sodium  100 mg Oral Daily   ferrous sulfate  325 mg Oral Q breakfast   isosorbide mononitrate  60 mg Oral Daily   lidocaine  1 patch Transdermal Q24H   metoprolol tartrate  12.5 mg Oral BID   morphine  15 mg Oral Q12H   multivitamin with minerals  1 tablet Oral Daily   mupirocin ointment  1 Application Nasal BID   pantoprazole  40 mg Oral Daily   pregabalin  200 mg Oral TID   senna  1 tablet Oral Daily   sodium chloride flush  10-40 mL Intracatheter Q12H    Objective: Vital signs in last 24 hours: Temp:  [98.4 F (36.9 C)-99.2 F (37.3 C)] 98.7 F (37.1 C) (02/19 0822) Pulse Rate:  [83-93] 93 (02/19 0822) Resp:  [16-18] 17 (02/19 0822) BP: (94-108)/(54-75) 108/64 (02/19 0822) SpO2:  [93 %-97 %] 94 % (02/19 0822)  LDA Foley Central lines Other catheters  PHYSICAL EXAM:  General: awake and alert Pale Chest b/l air entry Rt arm PICC Rt AKA-stump has wound vac Tender to palpate below the surgical site Lab Results Recent Labs    12/10/22 0417 12/11/22 0355  WBC 6.9 7.4  HGB 11.4* 10.6*  HCT 36.7 33.9*  NA 138 132*  K 4.6 4.2  CL 102 92*  CO2 29 30  BUN 14 16  CREATININE 0.65 0.65  Microbiology:  Studies/Results: [place] EKG SITE RITE  Result Date: 12/09/2022 If Site Rite image not attached, placement could not be confirmed due to  current cardiac rhythm.    Assessment/Plan: Infected Rt AKA stump wound  Acute osteomyelitis of Rt femur S/p revision ESBL E.coli /enterococcus and MRSA in the culture On Meropenem Will change to ertapenem and daptomycin Will need for 4-6 weeks   HLD on statin Also has severe atherosclerosis While on Daptomycin along with  atorvastatin watch closely for rhabdomyolysis Will monitor CPK weekly   Anemia  PAD with h/o thrombectomy, Bypass   Paroxysmal Afib    Discussed the management with the care team Will follow her as OP

## 2022-12-11 NOTE — Care Management Important Message (Signed)
Important Message  Patient Details  Name: Carla [name] MRN: [id number] Date of Birth: 1961-02-25   Medicare Important Message Given:  Other (see comment)  Attempted to review Medicare IM with patient via room phone [phone]) due to isolation status.  Line busy at this time, unable to reach upon attempt.   Dannette Barbara 12/11/2022, 1:27 PM

## 2022-12-12 DIAGNOSIS — E871 Hypo-osmolality and hyponatremia: Secondary | ICD-10-CM | POA: Insufficient documentation

## 2022-12-12 LAB — AEROBIC/ANAEROBIC CULTURE W GRAM STAIN (SURGICAL/DEEP WOUND)
Culture: NO GROWTH
Gram Stain: NONE SEEN

## 2022-12-12 LAB — GLUCOSE, CAPILLARY
Glucose-Capillary: 119 mg/dL — ABNORMAL HIGH (ref 70–99)
Glucose-Capillary: 131 mg/dL — ABNORMAL HIGH (ref 70–99)
Glucose-Capillary: 139 mg/dL — ABNORMAL HIGH (ref 70–99)

## 2022-12-12 MED ORDER — OXYCODONE-ACETAMINOPHEN 5-325 MG PO TABS
1.0000 | ORAL_TABLET | ORAL | Status: DC | PRN
  Administered 2022-12-12: 2 via ORAL
  Administered 2022-12-12 (×2): 1 via ORAL
  Administered 2022-12-13 (×3): 2 via ORAL
  Filled 2022-12-12 (×2): qty 2
  Filled 2022-12-12: qty 1
  Filled 2022-12-12 (×3): qty 2

## 2022-12-12 NOTE — TOC Progression Note (Signed)
Transition of Care [facility]) - Progression Note    Patient Details  Name: PEITON ANSELMO MRN: [id number] Date of Birth: 09/08/1961  Transition of Care [facility]) CM/SW Contact  Beverly Sessions, RN Phone Number: 12/12/2022, 11:06 AM  Clinical Narrative:    Bed offers presented to patient. Patient request that I follow up with daughter [name] and provide her the bed offers.  VM left for [facility]        Expected Discharge Plan and Services                                               Social Determinants of Health (SDOH) Interventions SDOH Screenings   Food Insecurity: No Food Insecurity (12/04/2022)  Housing: Low Risk  (12/04/2022)  Transportation Needs: No Transportation Needs (12/04/2022)  Utilities: Not At Risk (12/04/2022)  Depression (PHQ2-9): Low Risk  (02/02/2021)  Tobacco Use: Medium Risk (12/07/2022)    Readmission Risk Interventions    12/06/2022    1:31 PM 07/25/2022   10:49 AM 05/05/2022   10:52 AM  Readmission Risk Prevention Plan  Transportation Screening Complete Complete Complete  Medication Review Press photographer) Complete Complete Complete  PCP or Specialist appointment within 3-5 days of discharge  Complete Complete  HRI or Home Care Consult  Complete   SW Recovery Care/Counseling Consult Complete Not Complete Complete  SW Consult Not Complete Comments  NA   Palliative Care Screening Not Applicable  Not [place]  Patient Refused Patient Refused

## 2022-12-12 NOTE — Evaluation (Signed)
Physical Therapy Evaluation Patient Details Name: Carla [name] MRN: [id number] DOB: 1961/03/18 Today's Date: 12/12/2022  History of Present Illness  Pt is a 62 y.o. female presenting to hospital 12/03/22 with chest pain ("feeling like heart is fluttering").  Pt admitted with acute osteomyelitis of R thigh, hypokalemia, PVD, paroxysmal a-fib, and diarrhea.  12/07/22 pt s/p evacuation of hematoma from right above-knee amputation site, revision of amputation with resection of femur, non-disposable negative pressure dressing placement.  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  Clinical Impression  PT/OT co-session performed.  Prior to hospital admission, pt was min assist with stand/squat pivot transfers to/from w/c and able to propel self in manual w/c; lives with family.  Pt pre-medicated with pain meds for session but pt still reporting 9/10 R AKA (and L LE) pain during session.  Currently pt is mod assist x2 with bed mobility; good sitting balance; and able to laterally scoot to R on bed with min to mod assist x2 (minimal distance able to advance to R though).  Pt tearful/emotional most of session d/t pain/concern of pain with mobility but with encouragement and support pt able to stop crying at times and appeared appreciative of therapists help and support.  Pt's bed linens noted to be wet and were changed during session.  Pt would benefit from skilled PT to address noted impairments and functional limitations (see below for any additional details).  Upon hospital discharge, pt would benefit from SNF.    Recommendations for follow up therapy are one component of a multi-disciplinary discharge planning process, led by the  attending physician.  Recommendations may be updated based on patient status, additional functional criteria and insurance authorization.  Follow Up Recommendations Skilled nursing-short term rehab (<3 hours/day) Can patient physically be transported by private vehicle: No    Assistance Recommended at Discharge Frequent or constant Supervision/Assistance  Patient can return home with the following  Two people to help with walking and/or transfers;Two people to help with bathing/dressing/bathroom;Assistance with cooking/housework;Assist for transportation;Help with stairs or ramp for entrance    Equipment Recommendations Wheelchair (measurements PT);Wheelchair cushion (measurements PT);Hospital bed;Other (comment) (hoyer lift)  Recommendations for Other Services       Functional Status Assessment Patient has had a recent decline in their functional status and demonstrates the ability to make significant improvements in function in a reasonable and predictable amount of time.     Precautions / Restrictions Precautions Precautions: Fall Precaution Comments: R AKA; wound vac RLE Restrictions Weight Bearing Restrictions: Yes RLE Weight Bearing: Non weight bearing      Mobility  Bed Mobility Overal bed mobility: Needs Assistance Bed Mobility: Rolling, Supine to Sit, Sit to Supine Rolling: Mod assist   Supine to sit: Mod assist, +2 for physical assistance Sit to supine: Mod assist, +2 for physical assistance   General bed mobility comments: assist for trunk and B LE's; vc's for technique    Transfers Overall transfer level: Needs assistance Equipment used: None Transfers: Bed to chair/wheelchair/BSC            Lateral/Scoot Transfers: Min assist, Mod assist, +2 physical assistance General transfer comment: lateral scoot to R along bed with min to mod assist x2 (vc's for technique); minimal distance able to advance  to R though    Ambulation/Gait                General Gait Details: Deferred: pt non-ambulatory baseline  Stairs            Wheelchair Mobility    Modified Rankin (Stroke Patients Only)       Balance Overall balance assessment: Needs assistance Sitting-balance support: No upper extremity supported, Feet supported Sitting balance-[name]: Good Sitting balance - Comments: steady sitting reaching within BOS; pt able to laterally lean L/R sitting on edge of bed (CGA to return to midline with L lateral lean; min assist to return to midline with R lateral lean)                                     Pertinent Vitals/Pain Pain Assessment Pain Assessment: 0-10 Pain Score: 9  Pain Location: R LE (amputation site) > LE LE (d/t neuropathy per pt report) Pain Descriptors / Indicators: Crying, Grimacing, Guarding, Sore, Constant Pain Intervention(s): Limited activity within patient's tolerance, Monitored during session, Premedicated before session, Repositioned Vitals (HR and O2 on 2 L via nasal cannula) stable and WFL throughout treatment session.    Home Living Family/patient expects to be discharged to:: Private residence Living Arrangements: Children (pt's daughter, SIL, granddaughter, and grandchildren) Available Help at Discharge: Family;Available PRN/intermittently Type of Home: Mobile home Home Access: Level entry       Home Layout: One level Home Equipment: Wheelchair - manual;Shower seat      Prior Function Prior Level of Function : Needs assist       Physical Assist : Mobility (physical);ADLs (physical) Mobility (physical): Bed mobility;Transfers ADLs (physical): Bathing;Dressing;IADLs;Toileting Mobility Comments: Min assist for stand/squat pivot transfers to/from w/c; pt reports able to propel self in manual w/c ADLs Comments: Daughter assists with ADLs/IADLs as needed     Hand Dominance   Dominant Hand: Right    Extremity/Trunk Assessment   Upper Extremity Assessment Upper  Extremity Assessment: Generalized weakness    Lower Extremity Assessment Lower Extremity Assessment: Generalized weakness (L LE appearing grossly at least 3/5 AROM) RLE Deficits / Details: R AKA (appearing at least 2+/5 strength; pt using UE's to move R residual limb) RLE: Unable to fully assess due to pain    Cervical / Trunk Assessment Cervical / Trunk Assessment: Other exceptions Cervical / Trunk Exceptions: forward head/shoulders  Communication   Communication: No difficulties  Cognition Arousal/Alertness: Awake/alert Behavior During Therapy: Anxious Overall Cognitive Status: No family/caregiver present to determine baseline cognitive functioning                                 General Comments: Oriented to at least self and general situation; pt tearful most of session d/t pain/appearing fearful of mobility d/t pain        General Comments General comments (skin integrity, edema, etc.): R AKA wound vac in place.  Nursing cleared pt for participation in physical therapy.  Pt agreeable to PT session.    Exercises  Bed mobility and transfer training   Assessment/Plan    PT Assessment Patient needs continued PT services  PT Problem List Decreased strength;Decreased activity tolerance;Decreased balance;Decreased mobility;Decreased knowledge of use of DME;Decreased knowledge of precautions;Decreased skin integrity;Pain       PT Treatment Interventions DME instruction;Functional mobility training;Therapeutic activities;Therapeutic exercise;Balance training;Patient/family education;Wheelchair mobility training  PT Goals (Current goals can be found in the Care Plan section)  Acute Rehab PT Goals Patient Stated Goal: to improve pain and mobility PT Goal Formulation: With patient Time For Goal Achievement: 12/26/22 Potential to Achieve Goals: Fair    Frequency Min 2X/week     Co-evaluation PT/OT/SLP Co-Evaluation/Treatment: Yes Reason for Co-Treatment:  Necessary to address cognition/behavior during functional activity;For patient/therapist safety;To address functional/ADL transfers PT goals addressed during session: Mobility/safety with mobility;Balance OT goals addressed during session: ADL's and self-care       AM-PAC PT "6 Clicks" Mobility  Outcome Measure Help needed turning from your back to your side while in a flat bed without using bedrails?: A Lot Help needed moving from lying on your back to sitting on the side of a flat bed without using bedrails?: Total Help needed moving to and from a bed to a chair (including a wheelchair)?: Total Help needed standing up from a chair using your arms (e.g., wheelchair or bedside chair)?: Total Help needed to walk in hospital room?: Total Help needed climbing 3-5 steps with a railing? : Total 6 Click Score: 7    End of Session Equipment Utilized During Treatment: Oxygen Activity Tolerance: Patient limited by pain Patient left: in bed;with call bell/phone within reach;with bed alarm set;Other (comment) (B LE's elevated via pillow; L heel floating) Nurse Communication: Mobility status;Precautions PT Visit Diagnosis: Other abnormalities of gait and mobility (R26.89);Muscle weakness (generalized) (M62.81);Pain Pain - Right/Left: Right Pain - part of body: Leg    Time: 1000-1031 PT Time Calculation (min) (ACUTE ONLY): 31 min   Charges:   PT Evaluation $PT Eval Low Complexity: 1 Low         Evia Goldsmith, PT 12/12/22, 11:01 AM

## 2022-12-12 NOTE — Progress Notes (Signed)
Occupational Therapy Treatment Patient Details Name: Carla [name] MRN: [id number] DOB: 04/19/1961 Today's Date: 12/12/2022   History of present illness Pt is a 62 y.o. female presenting to hospital 12/03/22 with chest pain ("feeling like heart is fluttering").  Pt admitted with acute osteomyelitis of R thigh, hypokalemia, PVD, paroxysmal a-fib, and diarrhea.  12/07/22 pt s/p evacuation of hematoma from right above-knee amputation site, revision of amputation with resection of femur, non-disposable negative pressure dressing placement.  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   OT comments  Pt agreeable to OT/PT co-treatment to maximize safety and participation. Pt received supine in bed and agreeable to OT with encouragement. Pt demonstrated improvements in participation with therapy this date including EOB mobility, however, continues to be limited by pain (RLE amputation site and LLE) and overall anxiety. She required total A to don L sock prior to OOB mobility and Mod A +2 to come to EOB. Pt then engaged in seated grooming tasks with set up-supervision. She required Min-Mod A +2 for lateral scoot transfer along EOB before returning to supine. Pt then completed rolling L<>R with Mod A +1 in order to change soiled linens. OT provided total A for peri care (pt's skin very red and painful to touch). Pt left as received with all needs in reach. Pt is making progress toward goal completion. D/C recommendation remains appropriate. OT will continue to follow acutely.    Recommendations for follow up therapy are one component of a multi-disciplinary discharge planning process, led by the attending physician.  Recommendations may  be updated based on patient status, additional functional criteria and insurance authorization.    Follow Up Recommendations  Skilled nursing-short term rehab (<3 hours/day)     Assistance Recommended at Discharge Frequent or constant Supervision/Assistance  Patient can return home with the following  Two people to help with walking and/or transfers;Two people to help with bathing/dressing/bathroom;Assistance with cooking/housework;Assist for transportation;Help with stairs or ramp for entrance   Equipment Recommendations  Other (comment) (defer to next venue of care)    Recommendations for Other Services      Precautions / Restrictions Precautions Precautions: Fall Precaution Comments: R AKA; wound vac RLE Restrictions Weight Bearing Restrictions: Yes RLE Weight Bearing: Non weight bearing       Mobility Bed Mobility Overal bed mobility: Needs Assistance Bed Mobility: Rolling, Supine to Sit, Sit to Supine Rolling: Mod assist   Supine to sit: Mod assist, +2 for physical assistance Sit to supine: Mod assist, +2 for physical assistance   General bed mobility comments: assist for trunk and B LE's; vc's for technique    Transfers Overall transfer level: Needs assistance Equipment used: None Transfers: Bed to chair/wheelchair/BSC            Lateral/Scoot Transfers: Min assist, Mod assist, +2 physical assistance General transfer comment: lateral scoot to R along bed with min to mod assist x2 (vc's for technique); minimal distance able to advance to R though     Balance Overall balance assessment: Needs assistance Sitting-balance support: No upper extremity supported, Feet supported Sitting balance-[name]: Good Sitting balance - Comments: pt able to laterally lean L/R sitting on edge of bed (CGA to return to midline with L lateral lean; min assist  to return to midline with R lateral lean)                                   ADL either performed or  assessed with clinical judgement   ADL Overall ADL's : Needs assistance/impaired     Grooming: Set up;Sitting;Supervision/safety;Wash/dry face           Upper Body Dressing : Moderate assistance;Bed level Upper Body Dressing Details (indicate cue type and reason): to don/doff gown Lower Body Dressing: Bed level;Total assistance Lower Body Dressing Details (indicate cue type and reason): L sock     Toileting- Clothing Manipulation and Hygiene: Bed level;Total assistance Toileting - Clothing Manipulation Details (indicate cue type and reason): for peri care            Extremity/Trunk Assessment Upper Extremity Assessment Upper Extremity Assessment: Generalized weakness   Lower Extremity Assessment Lower Extremity Assessment: Generalized weakness RLE Deficits / Details: R AKA (appearing at least 2+/5 strength; pt using UE's to move R residual limb) RLE: Unable to fully assess due to pain   Cervical / Trunk Assessment Cervical / Trunk Assessment: Other exceptions Cervical / Trunk Exceptions: forward head/shoulders    Vision Baseline Vision/History: 1 Wears glasses (reading) Patient Visual Report: No change from baseline     Perception     Praxis      Cognition Arousal/Alertness: Awake/alert Behavior During Therapy: Anxious Overall Cognitive Status: No family/caregiver present to determine baseline cognitive functioning                                 General Comments: Oriented to at least self and general situation; pt tearful most of session d/t pain/appearing fearful of mobility d/t pain        Exercises      Shoulder Instructions       General Comments R AKA wound vac in place, constant VC t/o for deep breathing technique to decrease anxiety     Pertinent Vitals/ Pain       Pain Assessment Pain Assessment: 0-10 Faces Pain Scale: Hurts worst Pain Location: R LE (amputation site) > LE LE (d/t neuropathy per pt report) Pain Descriptors /  Indicators: Crying, Grimacing, Guarding, Sore, Constant Pain Intervention(s): Limited activity within patient's tolerance, Monitored during session, Premedicated before session, Repositioned  Home Living Family/patient expects to be discharged to:: Private residence Living Arrangements: Children (pt's daughter, SIL, granddaughter, and grandchildren) Available Help at Discharge: Family;Available PRN/intermittently Type of Home: Mobile home Home Access: Level entry     Home Layout: One level     Bathroom Shower/Tub: Teacher, early years/pre: Standard     Home Equipment: Wheelchair - manual;Shower seat          Prior Functioning/Environment              Frequency  Min 2X/week        Progress Toward Goals  OT Goals(current goals can now be found in the care plan section)  Progress towards OT goals: Progressing toward goals  Acute Rehab OT Goals Patient Stated Goal: reduce pain OT Goal Formulation: With patient Time For Goal Achievement: 12/25/22 Potential to Achieve Goals: [place] Discharge plan remains appropriate;Frequency remains appropriate    Co-evaluation    PT/OT/SLP Co-Evaluation/Treatment: Yes Reason for Co-Treatment: Necessary to address cognition/behavior during functional activity;For patient/therapist safety;To address functional/ADL  transfers PT goals addressed during session: Mobility/safety with mobility;Balance OT goals addressed during session: ADL's and self-care      AM-PAC OT "6 Clicks" Daily Activity     Outcome Measure   Help from another person eating meals?: A Little Help from another person taking care of personal grooming?: A Little Help from another person toileting, which includes using toliet, bedpan, or urinal?: Total Help from another person bathing (including washing, rinsing, drying)?: Total Help from another person to put on and taking off regular upper body clothing?: A Lot Help from another person to put  on and taking off regular lower body clothing?: Total 6 Click Score: 11    End of Session    OT Visit Diagnosis: Other abnormalities of gait and mobility (R26.89);Muscle weakness (generalized) (M62.81);Pain Pain - Right/Left: Right Pain - part of body: Leg   Activity Tolerance Patient limited by pain   Patient Left in bed;with call bell/phone within reach;with bed alarm set   Nurse Communication Mobility status        Time: 1000-1031 OT Time Calculation (min): 31 min  Charges: OT General Charges $OT Visit: 1 Visit OT Treatments $Self Care/Home Management : 8-22 mins  [facility] MS, OTR/L ascom [phone]  12/12/22, 1:47 PM

## 2022-12-12 NOTE — TOC Progression Note (Signed)
Transition of Care [facility]) - Progression Note    Patient Details  Name: Carla [name] MRN: [id number] Date of Birth: Jan 31, 1961  Transition of [facility]) CM/SW Contact  Beverly Sessions, RN Phone Number: 12/12/2022, 3:33 PM  Clinical Narrative:    Received return call from daughter Carla [name] and patient would like to accept bed at [facility].  Accepted in hub and notified Tammy at [facility]         Expected Discharge Plan and Services                                               Social Determinants of Health (SDOH) Interventions SDOH Screenings   Food Insecurity: No Food Insecurity (12/04/2022)  Housing: Low Risk  (12/04/2022)  Transportation Needs: No Transportation Needs (12/04/2022)  Utilities: Not At Risk (12/04/2022)  Depression (PHQ2-9): Low Risk  (02/02/2021)  Tobacco Use: Medium Risk (12/07/2022)    Readmission Risk Interventions    12/06/2022    1:31 PM 07/25/2022   10:49 AM 05/05/2022   10:52 AM  Readmission Risk Prevention Plan  Transportation Screening Complete Complete Complete  Medication Review Press photographer) Complete Complete Complete  PCP or Specialist appointment within 3-5 days of discharge  Complete Complete  HRI or Home Care Consult  Complete   SW Recovery Care/Counseling Consult Complete Not Complete Complete  SW Consult Not Complete Comments  NA   Palliative Care Screening Not Applicable  Not [place]  Patient Refused Patient Refused

## 2022-12-12 NOTE — Progress Notes (Signed)
Progress Note   Patient: Carla [name] [id number] DOB: 25-Aug-1961 DOA: 12/03/2022     9 DOS: the patient was seen and examined on 12/12/2022   Brief hospital course: This 62 y.o. female with medical history significant of CHF, A-fib , peripheral vascular disease , COPD,  hypertension,  hyperlipidemia,  arthritis,  scoliosis and depression.  She complains of Right leg pain, heart fluttering,  dizziness and shortness of breath.  For the past 2-1/2 months,  She is having open wound on her right leg. Her last dose of Eliquis was last night.  Also been very fatigued and sweating.  Has not noticed a fever.  CT scan of the femur shows stump osteomyelitis, complex fluid collection measuring 7 x 6 x 7.4 cm, tract from the collection to the open wound. Patient was started on vancomycin and Zosyn and hospitalist services contacted for further evaluation.  Vascular surgery was consulted. Patient underwent Evacuation of hematoma from right above-knee amputation site, revision of amputation with resection of femur, negative pressure dressing placement.  Wound culture came back with E. coli and Enterobacter, repeat culture has MRSA.  Antibiotic changed to ertapenem and daptomycin. Due to significant weakness, patient will need nursing home placement.   Principal Problem:   Acute osteomyelitis of right thigh (HCC) Active Problems:   Hypokalemia   PVD (peripheral vascular disease) (HCC)   Paroxysmal atrial fibrillation (HCC)   Hyperlipidemia, unspecified   Chronic pain   Obesity, Class III, BMI 40-49.9 (morbid obesity) ([place])   Essential hypertension   GERD (gastroesophageal reflux disease)   Neuropathy   Diarrhea   Amputation stump infection ([place])   Assessment and Plan: Acute osteomyelitis of right thigh: Due to E. coli, Enterobacter faecalis and MRSA. She presented with Right stump infection.  CT A/P: Showed fluid collection around the graft slightly increased in the interval. Osteomyelitis of the  right femur with fluid collection tracking up to the open wound.  Continue empiric antibiotics (vancomycin and Zosyn).  s/p Evacuation of hematoma from right above-knee amputation site, revision of amputation with resection of femur, negative pressure dressing placement.   Wound culture came back with E. coli and Enterobacter, repeat culture has MRSA.  Antibiotic changed to ertapenem and daptomycin per infectious disease. Patient currently waiting for nursing home placement.   Abdominal wall wound.   Followed by wound care.   Generalized weakness with severe debility. Continue PT/OT, pending nursing home placement.     Hypokalemia: Hyponatremia. Potassium normalized, sodium level stable.   Peripheral vascular disease: On Eliquis.   Paroxysmal atrial fibrillation: Continue Eliquis.   Hyperlipidemia, unspecified Continue statin.   Chronic pain: Continue her pain regimen.   Obesity, Class III, BMI 40-49.9 (morbid obesity) (HCC) BMI 40.24 with current height and weight in computer   Diarrhea: Improved Hold MiraLAX.   Neuropathy: Continue Lyrica 200 mg three times daily.   GERD (gastroesophageal reflux disease) Continue pantoprazole 40 mg daily.   Essential hypertension: Continue metoprolol 12.5 mg q12hr. BP improved.   Anxiety: Continue Xanax 0.5 mg twice daily as needed for anxiety      Subjective:  Patient has significant weakness, appetite is decent, no nausea vomiting.  Had a bowel movement yesterday.  Physical Exam: Vitals:   12/11/22 0822 12/11/22 1910 12/12/22 0522 12/12/22 0750  BP: 108/64 109/66 (!) 105/59 110/69  Pulse: 93 88 78 84  Resp: 17 16 16 18  $ Temp: 98.7 F (37.1 C) 98.6 F (37 C) 98.2 F (36.8 C) 98.1 F (36.7 C)  TempSrc:  Oral    SpO2: 94% 95% 94% 94%  Weight:      Height:       General exam: Appears calm and comfortable  Respiratory system: Clear to auscultation. Respiratory effort normal. Cardiovascular system: S1 & S2 heard,  RRR. No JVD, murmurs, rubs, gallops or clicks. No pedal edema. Gastrointestinal system: Abdomen is nondistended, soft and nontender. No organomegaly or masses felt. Normal bowel sounds heard. Central nervous system: Alert and oriented. No focal neurological deficits. Extremities: Right AKA. Skin: No rashes, lesions or ulcers Psychiatry: Judgement and insight appear normal. Mood & affect appropriate.    Data Reviewed:  Lab results reviewed.  Family Communication: None  Disposition: Status is: Inpatient Remains inpatient appropriate because: Unsafe discharge pending nursing home placement.     Time spent: 35 minutes  Author: Sharen Hones, MD 12/12/2022 11:32 AM  For on call review [email or web address].

## 2022-12-13 DIAGNOSIS — G894 Chronic pain syndrome: Secondary | ICD-10-CM

## 2022-12-13 MED ORDER — DAPTOMYCIN IV (FOR PTA / DISCHARGE USE ONLY): 500.0000 mg | INTRAVENOUS | 0 refills | Status: DC

## 2022-12-13 MED ORDER — OXYCODONE-ACETAMINOPHEN 5-325 MG PO TABS: 1.0000 | ORAL_TABLET | ORAL | 0 refills | Status: DC | PRN

## 2022-12-13 MED ORDER — ERTAPENEM IV (FOR PTA / DISCHARGE USE ONLY): 1.0000 g | INTRAVENOUS | 0 refills | Status: DC

## 2022-12-13 MED ORDER — MORPHINE SULFATE ER 15 MG PO TBCR: 15.0000 mg | EXTENDED_RELEASE_TABLET | Freq: Two times a day (BID) | ORAL | 0 refills | Status: DC

## 2022-12-13 NOTE — TOC Transition Note (Signed)
Transition of Care [facility]) - CM/SW Discharge Note   Patient Details  Name: Carla [name] MRN: [id number] Date of Birth: [birth year]  Transition of Care [facility]) CM/SW Contact:  Beverly Sessions, RN Phone Number: 12/13/2022, 10:47 AM   Clinical Narrative:     Josem Kaufmann received for patient MD in agreement for patient to discharge with wet to dry dressing in place and then facility place their wound vac when it arrives  Patient will DC [id number] Anticipated DC date: 12/13/22  Family notified:Daughter [name] Transport [phone]  Per MD patient ready for DC to . RN, patient, patient's family, and facility notified of DC. Discharge Summary sent to facility. RN given number for report. DC packet on chart. Ambulance transport requested for patient.  TOC signing off.  [name] [facility] [phone]         Patient Goals and CMS Choice      Discharge Placement                         Discharge Plan and Services Additional resources added to the After Visit Summary for                                       Social Determinants of Health (SDOH) Interventions SDOH Screenings   Food Insecurity: No Food Insecurity (12/04/2022)  Housing: Low Risk  (12/04/2022)  Transportation Needs: No Transportation Needs (12/04/2022)  Utilities: Not At Risk (12/04/2022)  Depression (PHQ2-9): Low Risk  (02/02/2021)  Tobacco Use: Medium Risk (12/07/2022)     Readmission Risk Interventions    12/06/2022    1:31 PM 07/25/2022   10:49 AM 05/05/2022   10:52 AM  Readmission Risk Prevention Plan  Transportation Screening Complete Complete Complete  Medication Review Press photographer) Complete Complete Complete  PCP or Specialist appointment within 3-5 days of discharge  Complete Complete  HRI or Home Care Consult  Complete   SW Recovery Care/Counseling Consult Complete Not Complete Complete  SW Consult Not Complete Comments  NA   Palliative Care Screening Not Applicable  Not [name]  Patient Refused Patient Refused

## 2022-12-13 NOTE — Progress Notes (Signed)
Report given to [facility] at [employer]. PICC line to right upper arm saline locked. PRN pain medication given. Patient aware of transport.

## 2022-12-13 NOTE — Care Management Important Message (Signed)
Important Message  Patient Details  Name: Carla [name] MRN: [id number] Date of Birth: 11-27-1960   Medicare Important Message Given:  Other (see comment)  Attempted to reach patient via room phone [phone]) to review Medicare IM.  Unable to reach upon attempt.    Dannette Barbara 12/13/2022, 10:51 AM

## 2022-12-13 NOTE — Plan of Care (Signed)
Patient AOX4, forgetful, VSS throughout shift.  All meds given on time as ordered.  Pt c/o pain throughout shift and anxiety relieved by PRN pain meds and xanax.  Diminished lungs, IS encouraged.  Purewick in place, output WDLs.  Wound vac intact, output minimal.  POC maintained, will continue to monitor.  Problem: Education: Goal: Knowledge of General Education information will improve Description: Including pain rating scale, medication(s)/side effects and non-pharmacologic comfort measures Outcome: Progressing   Problem: Health Behavior/Discharge Planning: Goal: Ability to manage health-related needs will improve Outcome: Progressing   Problem: Clinical Measurements: Goal: Ability to maintain clinical measurements within normal limits will improve Outcome: Progressing Goal: Will remain free from infection Outcome: Progressing Goal: Diagnostic test results will improve Outcome: Progressing Goal: Respiratory complications will improve Outcome: Progressing Goal: Cardiovascular complication will be avoided Outcome: Progressing   Problem: Activity: Goal: Risk for activity intolerance will decrease Outcome: Progressing   Problem: Nutrition: Goal: Adequate nutrition will be maintained Outcome: Progressing   Problem: Coping: Goal: Level of anxiety will decrease Outcome: Progressing   Problem: Elimination: Goal: Will not experience complications related to bowel motility Outcome: Progressing Goal: Will not experience complications related to urinary retention Outcome: Progressing   Problem: Pain Managment: Goal: General experience of comfort will improve Outcome: Progressing   Problem: Safety: Goal: Ability to remain free from injury will improve Outcome: Progressing   Problem: Skin Integrity: Goal: Risk for impaired skin integrity will decrease Outcome: Progressing

## 2022-12-13 NOTE — NC FL2 (Signed)
[place] LEVEL OF CARE FORM     IDENTIFICATION  Patient Name: Carla [name] Birthdate: Aug 01, 1961 Sex: female Admission Date (Current Location): 12/03/2022  [facility] and [place] Number:  Engineering geologist and Address:         Provider Number: [id number]  Attending Physician Name and Address:  Sharen Hones, MD  Relative Name and Phone Number:       Current Level of Care: Hospital Recommended Level of Care: [place] Prior Approval Number:    Date Approved/Denied:   PASRR Number: [id number]  Discharge Plan: SNF    Current Diagnoses: Patient Active Problem List   Diagnosis Date Noted   Hyponatremia 12/12/2022   Amputation stump infection ([place]) 12/08/2022   Acute osteomyelitis of right thigh ([place]) 12/03/2022   Neuropathy 12/03/2022   Diarrhea 12/03/2022   Leg abscess 08/31/2022   Anxiety [id number]   Chronic systolic CHF (congestive heart failure) ([place]) 08/30/2022   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 ([place]) 08/29/2022   Pressure injury of skin 08/28/2022   Right leg pain 08/26/2022   Hyperlipidemia, unspecified 08/26/2022   Iron deficiency anemia 08/26/2022   Acute blood loss anemia 08/09/2022   Hematoma 07/26/2022   Hematoma of right thigh 07/24/2022   Acute respiratory failure with hypoxia ([place]) 05/02/2022   Lactic acidosis 05/02/2022   Hypoalbuminemia due to protein-calorie malnutrition ([place]) 05/02/2022   Atherosclerosis of artery of extremity with rest pain ([place]) 04/06/2022   Rectal bleeding 03/21/2022   Hypokalemia 02/03/2022   Chronic anticoagulation    Anxiety and depression    Palpitations    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[place]) 12/03/2021   Critical limb ischemia of left lower extremity ([place]) 11/30/2021   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   Arterial occlusion 11/11/2021   CAD (coronary artery disease) 07/07/2021   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[place]) 06/27/2021   Ischemic pain of  right foot 06/27/2021   PVD (peripheral vascular disease) ([place]) 05/03/2021   Tobacco abuse counseling 10/26/2020   Smoker 10/26/2020   Chronic diastolic CHF (congestive heart failure) ([place]) 10/06/2020   Chronic pain 12/01/2019   Primary osteoarthritis involving multiple joints 12/01/2019   Carpal tunnel syndrome, right 11/10/2019   Health care maintenance 03/26/2019   Disorder of skeletal system 03/26/2019   Problems influencing health status 03/26/2019   Chronic low back pain (Secondary area of Pain) (Bilateral) w/o sciatica 03/26/2019   Aortic atherosclerosis ([place]) 09/12/2018   COPD (chronic obstructive pulmonary disease) ([place]) 09/12/2018   Moderate episode of recurrent major depressive disorder ([place]) 09/12/2018   Lumbar spondylosis 09/06/2018   Ventral hernia with obstruction and without gangrene 07/02/2018   Paroxysmal atrial fibrillation ([place]) 06/15/2018   Acute encephalopathy 04/08/2018   DDD (degenerative disc disease), lumbosacral 09/25/2017   Osteoarthritis of knee 09/25/2017   Adjustment disorder with anxiety 08/15/2017   Protein-calorie malnutrition, severe 08/08/2017   Chronic mesenteric ischemia (HCC)    AKI (acute kidney injury) ([place]) 07/29/2017   Hypotension 07/29/2017   Embolus of superior mesenteric artery (HCC)    Abdominal pain    Occlusive mesenteric ischemia ([place]) 06/19/2017   Acute focal ischemia of small intestine (HCC)    Acute right-sided low back pain with right-sided sciatica 06/13/2017   Osteoarthritis of knee (Bilateral) 06/13/2017   Chronic pain syndrome 09/13/2016   Obesity, Class III, BMI 40-49.9 (morbid obesity) ([place]) 07/16/2016   Personal history of other malignant neoplasm of skin 06/01/2016   Chronic knee pain (Primary Area of Pain) (Bilateral) 01/19/2016  Essential hypertension 01/19/2016   GERD (gastroesophageal reflux disease) 01/19/2016   Mixed hyperlipidemia 01/19/2016   Prediabetes 01/19/2016   Recurrent major depressive disorder  ([place]) 01/19/2016   Chronic pelvic pain in female 11/14/2013   Mixed stress and urge urinary incontinence 11/14/2013   Chest pain 08/06/2012    Orientation RESPIRATION BLADDER Height & Weight     Self, Time, Situation, Place  Normal Incontinent Weight: 100.4 kg Height:  5' 2"$  (157.5 cm)  BEHAVIORAL SYMPTOMS/MOOD NEUROLOGICAL BOWEL NUTRITION STATUS      Incontinent Diet (regular diet)  AMBULATORY STATUS COMMUNICATION OF NEEDS Skin   Extensive Assist Verbally Surgical wounds, PU Stage and Appropriate Care, Wound Vac                       Personal Care Assistance Level of Assistance              Functional Limitations Info             SPECIAL CARE FACTORS FREQUENCY  PT (By licensed PT), OT (By licensed OT)                    Contractures Contractures Info: Not present    Additional Factors Info  Code Status, Allergies Code Status Info: full Allergies Info: Gabapentin, Bactrim (Sulfamethoxazole-trimethoprim)           Current Medications (12/13/2022):  This is the current hospital active medication list Current Facility-Administered Medications  Medication Dose Route Frequency Provider Last Rate Last Admin   acetaminophen (TYLENOL) tablet 650 mg  650 mg Oral Q6H PRN Algernon Huxley, MD   650 mg at 12/12/22 2004   Or   acetaminophen (TYLENOL) suppository 650 mg  650 mg Rectal Q6H PRN Algernon Huxley, MD       ALPRAZolam Duanne Moron) tablet 0.5 mg  0.5 mg Oral BID PRN Algernon Huxley, MD   0.5 mg at 12/13/22 0307   alum & mag hydroxide-simeth (MAALOX/MYLANTA) 200-200-20 MG/5ML suspension 15-30 mL  15-30 mL Oral Q2H PRN Algernon Huxley, MD       apixaban Arne Cleveland) tablet 5 mg  5 mg Oral BID Dallie Piles, RPH   5 mg at 12/12/22 2223   atorvastatin (LIPITOR) tablet 80 mg  80 mg Oral QHS Algernon Huxley, MD   80 mg at 12/12/22 2221   bisacodyl (DULCOLAX) EC tablet 5 mg  5 mg Oral Daily PRN Algernon Huxley, MD       Chlorhexidine Gluconate Cloth 2 % PADS 6 each  6 each Topical  Daily Sharen Hones, MD   6 each at 12/12/22 [phone]   DAPTOmycin (CUBICIN) 500 mg in sodium chloride 0.9 % IVPB  500 mg Intravenous Q2000 Tsosie Billing, MD   Stopped at 12/12/22 1851   docusate sodium (COLACE) capsule 100 mg  100 mg Oral Daily Shawna Clamp, MD   100 mg at 12/12/22 [id number]   ertapenem [facility]) 1,000 mg in sodium chloride 0.9 % 100 mL IVPB  1 g Intravenous Q0600 Berton Mount, RPH 200 mL/hr at 12/13/22 0557 1,000 mg at 12/13/22 0557   ferrous sulfate tablet 325 mg  325 mg Oral Q breakfast Algernon Huxley, MD   325 mg at 12/12/22 [id number]   ipratropium-albuterol (DUONEB) 0.5-2.5 (3) MG/3ML nebulizer solution 3 mL  3 mL Nebulization Q6H PRN Algernon Huxley, MD       isosorbide mononitrate (IMDUR) 24 hr tablet 60 mg  60 mg Oral  Daily Algernon Huxley, MD   60 mg at 12/12/22 0937   lidocaine (LIDODERM) 5 % 1 patch  1 patch Transdermal Q24H Algernon Huxley, MD   1 patch at 12/12/22 [id number]   metoprolol tartrate (LOPRESSOR) tablet 12.5 mg  12.5 mg Oral BID Algernon Huxley, MD   12.5 mg at 12/12/22 2222   morphine (MS CONTIN) 12 hr tablet 15 mg  15 mg Oral Q12H Algernon Huxley, MD   15 mg at 12/12/22 2222   multivitamin with minerals tablet 1 tablet  1 tablet Oral Daily Algernon Huxley, MD   1 tablet at 12/12/22 0938   ondansetron (ZOFRAN) injection 4 mg  4 mg Intravenous Q6H PRN Pace, Brien R, NP       ondansetron (ZOFRAN) tablet 4 mg  4 mg Oral Q8H PRN Algernon Huxley, MD       Oral care mouth rinse  15 mL Mouth Rinse PRN Algernon Huxley, MD       oxyCODONE-acetaminophen (PERCOCET/ROXICET) 5-325 MG per tablet 1-2 tablet  1-2 tablet Oral Q4H PRN Sharen Hones, MD   2 tablet at 12/13/22 0658   pantoprazole (PROTONIX) EC tablet 40 mg  40 mg Oral Daily Algernon Huxley, MD   40 mg at 12/12/22 [id number]   pregabalin (LYRICA) capsule 200 mg  200 mg Oral TID Algernon Huxley, MD   200 mg at 12/12/22 2222   senna (SENOKOT) tablet 8.6 mg  1 tablet Oral Daily Shawna Clamp, MD   8.6 mg at 12/12/22 [id number]   sodium chloride flush (NS) 0.9 %  injection 10-40 mL  10-40 mL Intracatheter Q12H Algernon Huxley, MD   10 mL at 12/12/22 2223   sodium chloride flush (NS) 0.9 % injection 10-40 mL  10-40 mL Intracatheter PRN Algernon Huxley, MD         Discharge Medications: Please see discharge summary for a list of discharge medications.  Relevant Imaging Results:  Relevant Lab Results:   Additional Information SS#- [id number]  Beverly Sessions, RN

## 2022-12-13 NOTE — TOC Progression Note (Signed)
Transition of Care [facility]) - Progression Note    Patient Details  Name: MALERY PELLERITO MRN: [id number] Date of Birth: 1960/12/14  Transition of Care [facility]) CM/SW Contact  Beverly Sessions, RN Phone Number: 12/13/2022, 9:13 AM  Clinical Narrative:    Insurance auth pending for SNF        Expected Discharge Plan and Services                                               Social Determinants of Health (SDOH) Interventions SDOH Screenings   Food Insecurity: No Food Insecurity (12/04/2022)  Housing: Low Risk  (12/04/2022)  Transportation Needs: No Transportation Needs (12/04/2022)  Utilities: Not At Risk (12/04/2022)  Depression (PHQ2-9): Low Risk  (02/02/2021)  Tobacco Use: Medium Risk (12/07/2022)    Readmission Risk Interventions    12/06/2022    1:31 PM 07/25/2022   10:49 AM 05/05/2022   10:52 AM  Readmission Risk Prevention Plan  Transportation Screening Complete Complete Complete  Medication Review Press photographer) Complete Complete Complete  PCP or Specialist appointment within 3-5 days of discharge  Complete Complete  HRI or Home Care Consult  Complete   SW Recovery Care/Counseling Consult Complete Not Complete Complete  SW Consult Not Complete Comments  NA   Palliative Care Screening Not Applicable  Not [place]  Patient Refused Patient Refused

## 2022-12-13 NOTE — Progress Notes (Signed)
Notified by Colletta Maryland, RN/CM to removed wound vac and apply wet to dry dressing to right leg wound for transport. Daughter, [name], on the phone with patient during dressing change. Patient in pain, scheduled MS contin given prior to dressing change (see eMAR). Wet to dry applied and patient made aware that wound vac wound be applied at [place]. Daughter unhappy about this nurse removing wound vac, this nurse attempted to explain reasoning for removal, but daughter does not accept. Patient apologizes for crying in pain to this nurse, this nurse provided therapeutic listening to patient. Patient remains on phone with daughter as this nurse exits the room, call bell within reach.

## 2022-12-13 NOTE — Discharge Summary (Signed)
Physician Discharge Summary   Patient: Carla [name] MRN: [id number] DOB: 11/28/1960  Admit date:     12/03/2022  Discharge date: 12/13/22  Discharge Physician: Sharen Hones   PCP: [facility]   Recommendations at discharge:   Follow-up with PCP in 1 week. Follow-up with infectious disease for IV infusion. Follow-up with vascular surgery as scheduled.  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 [facility] Course: This 62 y.o. female with medical history significant of CHF, A-fib , peripheral vascular disease , COPD,  hypertension,  hyperlipidemia,  arthritis,  scoliosis and depression.  She complains of Right leg pain, heart fluttering,  dizziness and shortness of breath.  For the past 2-1/2 months,  She is having open wound on her right leg. Her last dose of Eliquis was last night.  Also been very fatigued and sweating.  Has not noticed a fever.  CT scan of the femur shows stump osteomyelitis, complex fluid collection measuring 7 x 6 x 7.4 cm, tract from the collection to the open wound. Patient was started on vancomycin and Zosyn and hospitalist services contacted for further evaluation.  Vascular surgery was consulted. Patient underwent Evacuation of hematoma from right above-knee amputation site, revision of amputation with resection of femur, negative pressure dressing placement.  Wound culture came back with E. coli and Enterobacter, repeat culture has MRSA.  Antibiotic changed to ertapenem and daptomycin. Due to  significant weakness, patient will need nursing home placement.  Assessment and Plan:  Acute osteomyelitis of right thigh: Due to E. coli, Enterobacter faecalis and MRSA. She presented with Right stump infection.  CT A/P: Showed fluid collection around the graft slightly increased in the interval. 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  Continue empiric antibiotics (vancomycin and Zosyn).  s/p Evacuation of hematoma from right above-knee amputation site, revision of amputation with resection of femur, negative pressure dressing placement.   Wound culture came back with E. coli and Enterobacter, repeat culture has MRSA.  Antibiotic changed to ertapenem and daptomycin per infectious disease. Patient will be transferred to nursing home today.  Discussed with vascular surgery, okay to keep wet-to-dry for 1 day at the wound.  Apply wound VAC when available tomorrow.   Abdominal wall wound.   Wound care order placed.  Follow-up with RN.   Generalized weakness with severe debility. Continue PT/OT, pending nursing home placement.     Hypokalemia: Hyponatremia. Potassium normalized, sodium level stable.   Peripheral vascular disease: On Eliquis.   Paroxysmal atrial fibrillation: Continue Eliquis.   Hyperlipidemia, unspecified Continue statin.   Chronic pain: Continue her pain regimen.   Obesity, Class III, BMI 40-49.9 (morbid obesity) (HCC) BMI 40.24 with current height and weight in computer   Diarrhea: Improved    Neuropathy: Resume home treatment.   GERD (gastroesophageal reflux disease) Continue PPI.   Essential hypertension: Continue metoprolol 12.5 mg q12hr.    Anxiety: Follow-up with PCP as outpatient.      Consultants: Vascular surgery, ID. Procedures performed: Right AKA  stump wound revision. Disposition: Skilled nursing facility Diet recommendation:  Discharge Diet Orders (From admission, onward)     Start      Ordered   12/13/22 0000  Diet - low sodium heart healthy        12/13/22 1005           Cardiac diet DISCHARGE MEDICATION: Allergies as of 12/13/2022       Reactions   Gabapentin    Tremors    Bactrim [sulfamethoxazole-trimethoprim] Itching        Medication List     STOP taking these medications    furosemide 40 MG tablet Commonly known as: LASIX   HYDROcodone-acetaminophen 7.5-325 MG tablet Commonly known as: NORCO   morphine 15 MG 12 hr tablet Commonly known as: MS CONTIN       TAKE these medications    alum & mag hydroxide-simeth 200-200-20 MG/5ML suspension Commonly known as: MAALOX/MYLANTA Take 15-30 mLs by mouth every 2 (two) hours as needed for indigestion.   apixaban 5 MG Tabs tablet Commonly known as: ELIQUIS Take 1 tablet (5 mg total) by mouth 2 (two) times daily.   aspirin EC 81 MG tablet Take 81 mg by mouth daily. Swallow whole.   atorvastatin 80 MG tablet Commonly known as: LIPITOR Take 80 mg by mouth at bedtime.   bisacodyl 5 MG EC tablet Commonly known as: DULCOLAX Take 1 tablet (5 mg total) by mouth daily as needed for moderate constipation.   citalopram 40 MG tablet Commonly known as: CeleXA Take 1 tablet (40 mg total) by mouth daily.   Combivent Respimat 20-100 MCG/ACT Aers respimat Generic drug: Ipratropium-Albuterol Inhale 1 puff into the lungs every 6 (six) hours as needed.   daptomycin  IVPB Commonly known as: CUBICIN Inject 500 mg into the vein daily. Indication:  Polymicrobial stump infection Last Day of Therapy: 01/21/2023 Labs - Once weekly:  CBC/D, CMP, CPK Please pull PIC at completion of IV antibiotics Fax weekly labs to [phone] Method of administration: IV Push Method of administration may be changed at the discretion of nursing facility   ertapenem  IVPB Commonly known as: INVANZ Inject 1 g into the vein daily. Indication: Polymicrobial stump Infection Last Day of Therapy: 01/21/2023 Labs - Once  weekly:  CBC/D, CMP, CPK Please pull PIC at completion of IV antibiotics Fax weekly labs to [phone] Method of administration: Mini-Bag Plus / Gravity Method of administration may be changed at the discretion of nursing facility   esomeprazole 20 MG capsule Commonly known as: NEXIUM Take 20 mg by mouth daily.   feeding supplement Liqd Take 237 mLs by mouth 2 (two) times daily between meals.   ferrous sulfate 325 (65 FE) MG tablet Take 325 mg by mouth daily.   isosorbide mononitrate 60 MG 24 hr tablet Commonly known as: IMDUR Take 60 mg by mouth daily.   lidocaine 5 % Commonly known as: LIDODERM Place 1 patch onto the skin daily. Remove & Discard patch within 12 hours or as directed by MD   metoprolol tartrate 25 MG tablet Commonly known as: LOPRESSOR Take 0.5 tablets (12.5 mg total) by mouth 2 (two) times daily. Hold if SBP is 100 or less or HR is less than 60. Notify Provider What changed: how much to take   oxyCODONE-acetaminophen 5-325 MG tablet Commonly known as: PERCOCET/ROXICET Take 1-2 tablets by mouth every 4 (four) hours as needed for severe pain.   polyethylene glycol 17 g packet Commonly known as: MIRALAX /  GLYCOLAX Take 17 g by mouth daily.   pregabalin 200 MG capsule Commonly known as: LYRICA Take 1 capsule (200 mg total) by mouth in the morning, at noon, and at bedtime.               Home Infusion Instuctions  (From admission, onward)           Start     Ordered   12/13/22 0000  Home infusion instructions       Question:  Instructions  Answer:  Flushing of vascular access device: 0.9% NaCl pre/post medication administration and prn patency; Heparin 100 u/ml, 54m for implanted ports and Heparin 10u/ml, 52mfor all other central venous catheters.   12/13/22 1002              Discharge Care Instructions  (From admission, onward)           Start     Ordered   12/13/22 0000  Discharge wound care:       Comments: Wound care   Daily      Comments: Wound care to right lower abdomen full thickness wound: cleanse with soap and water, rinse and pat dry. Cover with size appropriate piece of silver hydrofiber (Aquacel Ag+ Advantage, LaKellie Simmering [id number] Top with dry 2x2s and secure with paper tape. Change daily.  Wound right AKA stump: wet to dry x 1 day, wound vac when available tomorrow. Follow with wound care and vascular.   12/13/22 1005            Contact information for follow-up providers     [place], JaErskine SquibbMD Follow up in 1 month(s).   Specialties: Vascular Surgery, Radiology, Interventional Cardiology Why: F/U with FaEulogio Ditchor wound check with wound vac. Contact information: [phone]            Contact information for after-discharge care     Destination     [facility] Preferred [facility]F .   Service: Skilled Nursing Contact information: [phone]                  Discharge Exam: FiDanley Dankereights   12/03/22 0319 12/10/22 0455  Weight: 99.8 kg 100.4 kg   General exam: Appears calm and comfortable, morbidly obese. Respiratory system: Clear to auscultation. Respiratory effort normal. Cardiovascular system: S1 & S2 heard, RRR. No JVD, murmurs, rubs, gallops or clicks. No pedal edema. Gastrointestinal system: Abdomen is nondistended, soft and nontender. No organomegaly or masses felt. Normal bowel sounds heard. Central nervous system: Alert and oriented. No focal neurological deficits. Extremities: Right AKA. Skin: No rashes, lesions or ulcers Psychiatry:  Mood & affect appropriate.    Condition at discharge: fair  The results of significant diagnostics from this hospitalization (including imaging, microbiology, ancillary and laboratory) are listed below for reference.   Imaging Studies: [place] SITE RITE  Result Date: 12/09/2022 If Site Rite  image not attached, placement could not be confirmed due to current cardiac rhythm.  CT FEMUR RIGHT W CONTRAST  Result Date: 12/03/2022 CLINICAL DATA:  Right lower extremity above the knee amputation, with surface draining wound at the anterior stump and suspected stump abscess. EXAM: CT OF THE LOWER RIGHT EXTREMITY WITH CONTRAST TECHNIQUE: Multidetector CT imaging of the lower right extremity was performed according to the standard protocol following intravenous contrast administration. RADIATION DOSE REDUCTION: This exam was  performed according to the departmental dose-optimization program which includes automated exposure control, adjustment of the mA and/or kV according to patient size and/or use of iterative reconstruction technique. CONTRAST:  168m OMNIPAQUE IOHEXOL 300 MG/ML  SOLN COMPARISON:  Similar study dated 08/26/2022 FINDINGS: Bones/Joint/Cartilage An above-the-knee amputation of the right lower extremity is again noted. The proximal and mid shaft of the femur again demonstrates numerous punctate lucencies in the outer and endosteal cortex, probably due to cortical holes related to disuse osteopenia. 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 There is partial joint space loss of the right hip with acetabular spurs. There are no other findings to suggest osteomyelitis elsewhere. There is mild spurring of the right SI joint. The visualized right hemipelvis is osteopenic but intact. Ligaments Suboptimally assessed by CT. Muscles and Tendons Amputation changes with partial right thigh muscle atrophy. The more proximal tendons at the level of the gluteal insertions and iliopsoas, are unremarkable. There is also partial gluteal muscular atrophy. There is no soft tissue gas in deep intramuscular planes  in the thigh. Soft tissues A complex collection of mixed attenuation and a thick wall continues to surround the distal femoral stump. This continues to measure 7 x 6 cm coronal and AP, slightly larger craniocaudal with today's craniocaudal measurement of 7.4 cm, previously 6.7 cm. A portion of this of appears to extend into the intramedullary space of the stump. There is increasing enhancement of its outer wall. There is new demonstration of a tract from the collection to an open wound on the anterior skin surface of the distal thigh on series 3 axial images 252-260, which was not seen previously. No soft tissue gas is seen in the collection or in the subcutaneous tissues but an infectious complication is considered present until proven otherwise given the additional findings. There is increasing prominence of right inguinal chain nodes some of which measure up to 1 cm in short axis. Surgical clips are again noted in the right groin alongside vascular structures. A large ventral hernia sags to the right as before with multiple small and large bowel loops within a no dilated bowel in the hernia sac or visualized abdomen. There is a stent in proximal right external iliac artery. IMPRESSION: 1. Increasingly eroded appearance of the distal 5 cm of the femoral shaft to the resection margin, with increased raised periosteal reaction posteriorly, consistent with stump osteomyelitis. 2. 7 x 6 x 7.4 cm complex collection of mixed attenuation and a thick wall continues to surround the distal femoral stump. A portion of this appears to extend into the intramedullary space of the stump. 3. There is new demonstration of a tract from the collection to an open wound on the anterior skin surface of the distal thigh. No soft tissue gas is seen in the collection or in the subcutaneous tissues but an infectious collection is considered present until proven otherwise. 4. Increasing prominence of right inguinal chain nodes some of which  measure up to 1 cm in short axis. 5. Large ventral hernia again sags to the right with multiple small and large bowel loops within both with no dilated bowel in the hernia sac or visualized abdomen. Electronically Signed   By: KTelford NabM.D.   On: 12/03/2022 06:13   CT ABDOMEN PELVIS W CONTRAST  Result Date: 12/03/2022 CLINICAL DATA:  Left lower  quadrant abdominal pain and diarrhea. EXAM: CT ABDOMEN AND PELVIS WITH CONTRAST TECHNIQUE: Multidetector CT imaging of the abdomen and pelvis was performed using the standard protocol following bolus administration of intravenous contrast. RADIATION DOSE REDUCTION: This exam was performed according to the departmental dose-optimization program which includes automated exposure control, adjustment of the mA and/or kV according to patient size and/or use of iterative reconstruction technique. CONTRAST:  149m OMNIPAQUE IOHEXOL 300 MG/ML  SOLN COMPARISON:  09/07/2022 FINDINGS: Lower chest: Of the unremarkable. Hepatobiliary: No suspicious focal abnormality within the liver parenchyma. Gallbladder is surgically absent. Intra and extrahepatic biliary duct dilatation is similar to prior with gradual tapering of the common bile duct in the head of the pancreas measuring 7-8 mm diameter just proximal to the ampulla. Pancreas: No focal mass lesion. No dilatation of the main duct. No intraparenchymal cyst. No peripancreatic edema. Spleen: No splenomegaly. No focal mass lesion. Adrenals/Urinary Tract: Right adrenal gland unremarkable. Stable thickening left adrenal gland. Kidneys unremarkable. No evidence for hydroureter. The urinary bladder appears normal for the degree of distention. Stomach/Bowel: Stomach is unremarkable. No gastric wall thickening. No evidence of outlet obstruction. Duodenum is normally positioned as is the ligament of Treitz. No small bowel wall thickening. No small bowel dilatation. The terminal ileum is normal. The appendix is not well visualized, but  there is no edema or inflammation in the region of the cecum. No gross colonic mass. No colonic wall thickening. 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 Extra anatomic bypass graft left thoracoabdominal wall. Fluid collection around the graft again noted, slightly increased in the interval. Celiac axis is patent. Stent device noted proximal SMA with occlusion, as described on CTA exam 07/22/2022. Renal arteries are patent. There is no gastrohepatic or hepatoduodenal ligament lymphadenopathy. No retroperitoneal or mesenteric lymphadenopathy. No pelvic sidewall lymphadenopathy. Reproductive: Uterus surgically absent.  There is no adnexal mass. Other: No intraperitoneal free fluid. Musculoskeletal: No worrisome lytic or sclerotic osseous abnormality. Large wide-mouth ventral hernia containing small bowel and colon again noted without complicating features. IMPRESSION: 1. No acute findings in the abdomen or pelvis. Specifically, no findings to explain the patient's history of left lower quadrant pain and diarrhea. 2. 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 Extra anatomic bypass graft left thoracoabdominal wall. Fluid collection around the graft again noted, slightly increased in the interval. 3. Stable large wide-mouth ventral hernia containing small bowel and colon without complicating features. 4. Intra and extrahepatic biliary duct dilatation is similar to prior with gradual tapering of the common bile duct in the head of the pancreas measuring 7-8 mm diameter just proximal to the ampulla. This may be related to prior cholecystectomy. Correlation with liver function test may prove helpful. 5.  Aortic Atherosclerosis (ICD10-I70.0). Electronically Signed   By: EMisty StanleyM.D.   On: 12/03/2022 05:52   DG Chest Portable 1 View  Result Date: 12/03/2022 CLINICAL DATA:  Chest pain and tachycardia EXAM: PORTABLE  CHEST 1 VIEW COMPARISON:  05/02/2022 FINDINGS: Artifact from EKG leads. Normal heart size and mediastinal contours. No acute infiltrate or edema. No effusion or pneumothorax. No acute osseous findings. IMPRESSION: No active disease. Electronically Signed   By: JJorje GuildM.D.   On: 12/03/2022 04:03    Microbiology: Results for orders placed or performed during the hospital encounter of 12/03/22  Resp panel by RT-PCR (RSV, Flu A&B, Covid) Anterior Nasal Swab     Status: None   Collection  Time: 12/03/22  3:59 AM   Specimen: Anterior Nasal Swab  Result Value Ref Range Status   SARS Coronavirus 2 by RT PCR NEGATIVE NEGATIVE Final    Comment: (NOTE) SARS-CoV-2 target nucleic acids are NOT DETECTED.  The SARS-CoV-2 RNA is generally detectable in upper respiratory specimens during the acute phase of infection. The lowest concentration of SARS-CoV-2 viral copies this assay can detect is 138 copies/mL. A negative result does not preclude SARS-Cov-2 infection and should not be used as the sole basis for treatment or other patient management decisions. A negative result may occur with  improper specimen collection/handling, submission of specimen other than nasopharyngeal swab, presence of viral mutation(s) within the areas targeted by this assay, and inadequate number of viral copies(<138 copies/mL). A negative result must be combined with clinical observations, patient history, and epidemiological information. The expected result is Negative.  Fact Sheet for Patients:  EntrepreneurPulse.com.au  Fact Sheet for Healthcare Providers:  IncredibleEmployment.be  This test is no t yet approved or cleared by the [place] FDA and  has been authorized for detection and/or diagnosis of SARS-CoV-2 by FDA under an Emergency Use Authorization (EUA). This EUA will remain  in effect (meaning this test can be used) for the duration of the COVID-19 declaration  under Section 564(b)(1) of the Act, 21 U.S.C.section 360bbb-3(b)(1), unless the authorization is terminated  or revoked sooner.       Influenza A by PCR NEGATIVE NEGATIVE Final   Influenza B by PCR NEGATIVE NEGATIVE Final    Comment: (NOTE) The Xpert Xpress SARS-CoV-2/FLU/RSV plus assay is intended as an aid in the diagnosis of influenza from Nasopharyngeal swab specimens and should not be used as a sole basis for treatment. Nasal washings and aspirates are unacceptable for Xpert Xpress SARS-CoV-2/FLU/RSV testing.  Fact Sheet for Patients: EntrepreneurPulse.com.au  Fact Sheet for Healthcare Providers: IncredibleEmployment.be  This test is not yet approved or cleared by the [place] FDA and has been authorized for detection and/or diagnosis of SARS-CoV-2 by FDA under an Emergency Use Authorization (EUA). This EUA will remain in effect (meaning this test can be used) for the duration of the COVID-19 declaration under Section 564(b)(1) of the Act, 21 U.S.C. section 360bbb-3(b)(1), unless the authorization is terminated or revoked.     Resp Syncytial Virus by PCR NEGATIVE NEGATIVE Final    Comment: (NOTE) Fact Sheet for Patients: EntrepreneurPulse.com.au  Fact Sheet for Healthcare Providers: IncredibleEmployment.be  This test is not yet approved or cleared by the [place] FDA and has been authorized for detection and/or diagnosis of SARS-CoV-2 by FDA under an Emergency Use Authorization (EUA). This EUA will remain in effect (meaning this test can be used) for the duration of the COVID-19 declaration under Section 564(b)(1) of the Act, 21 U.S.C. section 360bbb-3(b)(1), unless the authorization is terminated or revoked.  Performed at [facility], [address] [place], [place] [address]   Aerobic/Anaerobic Culture w Gram Stain (surgical/deep wound)     Status: None   Collection  Time: 12/03/22  9:56 AM   Specimen: Abscess; Wound  Result Value Ref Range Status   Specimen Description   Final    ABSCESS Performed at [facility], [address], [place], [place] [address]    Special Requests   Final    Normal Performed at [facility], [address] [place], [place] [address]    Gram Stain   Final    MODERATE WBC PRESENT, PREDOMINANTLY PMN NO ORGANISMS SEEN  Culture   Final    RARE ESCHERICHIA COLI Confirmed Extended Spectrum Beta-Lactamase Producer (ESBL).  In bloodstream infections from ESBL organisms, carbapenems are preferred over piperacillin/tazobactam. They are shown to have a lower risk of mortality. RARE ENTEROCOCCUS FAECALIS RARE PREVOTELLA SPECIES BETA LACTAMASE POSITIVE Performed at [facility], [place] [address] [place], [place] [address]    Report Status 12/09/2022 FINAL  Final   Organism ID, Bacteria ESCHERICHIA COLI  Final   Organism ID, Bacteria ENTEROCOCCUS FAECALIS  Final      Susceptibility   Escherichia coli - MIC*    AMPICILLIN >=32 RESISTANT Resistant     CEFEPIME >=32 RESISTANT Resistant     CEFTAZIDIME RESISTANT Resistant     CEFTRIAXONE >=64 RESISTANT Resistant     CIPROFLOXACIN >=4 RESISTANT Resistant     GENTAMICIN <=1 SENSITIVE Sensitive     IMIPENEM <=0.25 SENSITIVE Sensitive     TRIMETH/SULFA >=320 RESISTANT Resistant     AMPICILLIN/SULBACTAM 16 INTERMEDIATE Intermediate     PIP/TAZO <=4 SENSITIVE Sensitive     * RARE ESCHERICHIA COLI   Enterococcus faecalis - MIC*    AMPICILLIN <=2 SENSITIVE Sensitive     VANCOMYCIN 1 SENSITIVE Sensitive     GENTAMICIN SYNERGY SENSITIVE Sensitive     * RARE ENTEROCOCCUS FAECALIS  Surgical PCR screen     Status: Abnormal   Collection Time: 12/07/22  2:38 AM   Specimen: Nasal Mucosa; Nasal Swab  Result Value Ref Range Status   MRSA, PCR POSITIVE (A) NEGATIVE Final    Comment: CRITICAL RESULT CALLED TO, READ BACK BY AND VERIFIED WITH: ERWIN MACROHON RN  @0512$  12/07/22 ASW    Staphylococcus aureus POSITIVE (A) NEGATIVE Final    Comment: CRITICAL RESULT CALLED TO, READ BACK BY AND VERIFIED WITH: Erlene Quan RN @0512$  12/07/22 ASW (NOTE) The Xpert SA Assay (FDA approved for NASAL specimens in patients 19 years of age and older), is one component of a comprehensive surveillance program. It is not intended to diagnose infection nor to guide or monitor treatment. Performed at [facility], [address] [place], [place] [address]   Aerobic/Anaerobic Culture w Gram Stain (surgical/deep wound)     Status: None   Collection Time: 12/07/22  9:10 AM   Specimen: PATH Other; Body Fluid  Result Value Ref Range Status   Specimen Description   Final    WOUND RIGHT ABOVE THE KNEE Performed at [facility], [address], [place], [place] [address]    Special Requests   Final    NONE Performed at [facility], [address] [place], [place] [address]    Gram Stain NO WBC SEEN NO ORGANISMS SEEN   Final   Culture   Final    RARE METHICILLIN RESISTANT STAPHYLOCOCCUS AUREUS NO ANAEROBES ISOLATED Performed at [facility], [place] [address] [place], [place] [address]    Report Status 12/12/2022 FINAL  Final   Organism ID, Bacteria METHICILLIN RESISTANT STAPHYLOCOCCUS AUREUS  Final      Susceptibility   Methicillin resistant staphylococcus aureus - MIC*    CIPROFLOXACIN >=8 RESISTANT Resistant     ERYTHROMYCIN >=8 RESISTANT Resistant     GENTAMICIN <=0.5 SENSITIVE Sensitive     OXACILLIN >=4 RESISTANT Resistant     TETRACYCLINE >=16 RESISTANT Resistant     VANCOMYCIN 1 SENSITIVE Sensitive     TRIMETH/SULFA <=10 SENSITIVE Sensitive     CLINDAMYCIN >=8 RESISTANT Resistant     RIFAMPIN <=0.5 SENSITIVE Sensitive  Inducible Clindamycin NEGATIVE Sensitive     * RARE METHICILLIN RESISTANT STAPHYLOCOCCUS AUREUS  Aerobic/Anaerobic Culture w Gram Stain (surgical/deep wound)     Status: None   Collection Time:  12/07/22  9:18 AM   Specimen: PATH Other; Tissue  Result Value Ref Range Status   Specimen Description   Final    BONE RIGHT ABOVE THE KNEE AMPUTATION BONE Performed at [facility], [address] [place], [place] [address]    Special Requests   Final    NONE Performed at [facility], [address] [place], [place] [address]    Gram Stain   Final    FEW WBC PRESENT,BOTH PMN AND MONONUCLEAR NO ORGANISMS SEEN    Culture   Final    No growth aerobically or anaerobically. Performed at [facility], [place] [address] [place], [place] [address]    Report Status 12/12/2022 FINAL  Final    Labs: CBC: Recent Labs  Lab 12/08/22 0331 12/09/22 0344 12/10/22 0417 12/11/22 0355  WBC 9.7 7.2 6.9 7.4  HGB 9.6* 9.2* 11.4* 10.6*  HCT 31.1* 30.2* 36.7 33.9*  MCV 87.1 87.0 85.9 85.4  PLT 202 210 218 [id number]   Basic Metabolic Panel: Recent Labs  Lab 12/07/22 0524 12/08/22 0331 12/10/22 0417 12/11/22 0355  NA  --  133* 138 132*  K  --  4.2 4.6 4.2  CL  --  103 102 92*  CO2  --  20* 29 30  GLUCOSE  --  196* 90 130*  BUN  --  12 14 16  $ CREATININE 0.61 0.59 0.65 0.65  CALCIUM  --  8.1* 8.5* 8.5*  MG  --  2.1 2.3 2.4  PHOS  --  2.7 4.1 4.7*   Liver Function Tests: No results for input(s): "AST", "ALT", "ALKPHOS", "BILITOT", "PROT", "ALBUMIN" in the last 168 hours. CBG: Recent Labs  Lab 12/12/22 0751 12/12/22 1137 12/12/22 1627  GLUCAP 119* 131* 139*    Discharge time spent: greater than 30 minutes.  Signed: Sharen Hones, MD [facility] 12/13/2022

## 2022-12-14 ENCOUNTER — Telehealth: Payer: Self-pay

## 2022-12-14 NOTE — Telephone Encounter (Signed)
Faxed medical records to [facility] at [phone].

## 2022-12-14 NOTE — Anesthesia Postprocedure Evaluation (Signed)
Anesthesia Post Note  Patient: Carla [name]  Procedure(s) Performed: APPLICATION OF WOUND VAC AMPUTATION ABOVE KNEE REVISION WITH RESECTION OF FEMUR (Knee) EVACUATION HEMATOMA ABOVE KNEE AMPUTATION  Patient location during evaluation: PACU Anesthesia Type: General Level of consciousness: sedated Pain management: satisfactory to patient Vital Signs Assessment: post-procedure vital signs reviewed and stable Respiratory status: spontaneous breathing Cardiovascular status: stable Anesthetic complications: no   No notable events documented.   Last Vitals:  Vitals:   12/13/22 0300 12/13/22 0812  BP: (!) 110/58 115/67  Pulse: 70 79  Resp: 16 18  Temp: 36.5 C 37 C  SpO2: 95% 94%    Last Pain:  Vitals:   12/13/22 1136  TempSrc:   PainSc: 0-No pain                 VAN STAVEREN,Tereza Gilham

## 2022-12-15 ENCOUNTER — Ambulatory Visit (INDEPENDENT_AMBULATORY_CARE_PROVIDER_SITE_OTHER): Payer: 59 | Admitting: Nurse Practitioner

## 2022-12-15 ENCOUNTER — Telehealth (INDEPENDENT_AMBULATORY_CARE_PROVIDER_SITE_OTHER): Payer: Self-pay

## 2022-12-15 NOTE — Telephone Encounter (Signed)
Patient reach out to the office stating that her wound dressing has not been change since she was discharge from the hospital. Patient is currently at [facility]. I spoke with wound care nurse Rachael and she stated that dressing is being change as directed. Also the nurse informed that they were waiting for the wound vac to be receive. The vac is schedule to come today and will be placed on patient wound as order.

## 2022-12-18 ENCOUNTER — Other Ambulatory Visit (INDEPENDENT_AMBULATORY_CARE_PROVIDER_SITE_OTHER): Payer: Self-pay | Admitting: Nurse Practitioner

## 2022-12-18 ENCOUNTER — Telehealth (INDEPENDENT_AMBULATORY_CARE_PROVIDER_SITE_OTHER): Payer: Self-pay | Admitting: Vascular Surgery

## 2022-12-18 DIAGNOSIS — Z9889 Other specified postprocedural states: Secondary | ICD-10-CM

## 2022-12-18 DIAGNOSIS — M79672 Pain in left foot: Secondary | ICD-10-CM

## 2022-12-18 NOTE — Telephone Encounter (Signed)
Patient can be seen on Wednesday with an ultrasound but if she can't wait she can go to the ED

## 2022-12-18 NOTE — Telephone Encounter (Signed)
Patient called and stated she is having a lot of pain in her left leg. She states she can stand for anyone to touch it and it is very painful for her to try to get into her wheelchair.  She also stated it is red down by the heel of her foot.  Please advise.

## 2022-12-18 NOTE — Telephone Encounter (Signed)
Patient nurse Logan from [facility] was notified with information.

## 2022-12-20 ENCOUNTER — Ambulatory Visit (INDEPENDENT_AMBULATORY_CARE_PROVIDER_SITE_OTHER): Payer: 59 | Admitting: Nurse Practitioner

## 2022-12-20 ENCOUNTER — Ambulatory Visit: Payer: 59 | Admitting: Surgery

## 2022-12-20 ENCOUNTER — Encounter (INDEPENDENT_AMBULATORY_CARE_PROVIDER_SITE_OTHER): Payer: Self-pay | Admitting: Nurse Practitioner

## 2022-12-20 ENCOUNTER — Ambulatory Visit (INDEPENDENT_AMBULATORY_CARE_PROVIDER_SITE_OTHER): Payer: PRIVATE HEALTH INSURANCE

## 2022-12-20 DIAGNOSIS — M79672 Pain in left foot: Secondary | ICD-10-CM

## 2022-12-20 DIAGNOSIS — I739 Peripheral vascular disease, unspecified: Secondary | ICD-10-CM | POA: Diagnosis not present

## 2022-12-20 DIAGNOSIS — Z9889 Other specified postprocedural states: Secondary | ICD-10-CM | POA: Diagnosis not present

## 2022-12-26 LAB — LAB REPORT - SCANNED

## 2022-12-28 ENCOUNTER — Telehealth (INDEPENDENT_AMBULATORY_CARE_PROVIDER_SITE_OTHER): Payer: Self-pay

## 2022-12-28 NOTE — Telephone Encounter (Signed)
Treatment nurse left a message about patient wound vac. I left a message on the voicemail for Jonelle Sidle to return call to the office

## 2022-12-29 NOTE — Telephone Encounter (Signed)
Wound care nurse reach out to informed that there is no drainage going into the wound vac. The wound measurement 4.5cm x7cm and 2.5cm depth. Wound care nurse want to know if they should continue with vac or change to different wound care. Please Advise

## 2022-12-29 NOTE — Telephone Encounter (Signed)
Left a detailed message on wound care nurse line ext [phone] for [facility] with wound orders

## 2022-12-29 NOTE — Telephone Encounter (Signed)
Please continue

## 2023-01-01 ENCOUNTER — Telehealth (INDEPENDENT_AMBULATORY_CARE_PROVIDER_SITE_OTHER): Payer: Self-pay

## 2023-01-02 ENCOUNTER — Telehealth (INDEPENDENT_AMBULATORY_CARE_PROVIDER_SITE_OTHER): Payer: Self-pay

## 2023-01-02 ENCOUNTER — Telehealth (INDEPENDENT_AMBULATORY_CARE_PROVIDER_SITE_OTHER): Payer: Self-pay | Admitting: Nurse Practitioner

## 2023-01-02 LAB — LAB REPORT - SCANNED: EGFR: 90

## 2023-01-02 NOTE — Telephone Encounter (Signed)
I spoke with Carla [name] and she stated Carla [name] gets discharged today 01/02/23 at 7 pm and had a wound vac at home. She is currently using the facilities wound vac, but doesn't want the wound vac changed. She would like to use wet and dry dressing instead of the wound vac. Carla [name] stated that Elzena's daughter said that they have a would vac at home and they would put it on her once she got home.

## 2023-01-02 NOTE — Telephone Encounter (Signed)
Patients Daughter called upset because [facility] hasn't changed her moms wound vac since last wednesday 12/27/2022. Just wanted to let us know.

## 2023-01-02 NOTE — Telephone Encounter (Signed)
Jonelle Sidle called right after the daughter called upset, stating that Saila gets discharge 01/02/23 and does she need to discharged with the wound Vac. April left a voice mail 12/29/22 stating that the patient needed to go home with the wound vac, but Jonelle Sidle said she never received the voicemail once again. I left a voicemail 01/01/2023 stating that Tresa should go home with the would Vac, and it should be changed Per April the patient should go home with the wound vac. I would call again 01/02/23 to speak with her verbally so it wouldn't lost in the voicemail.

## 2023-01-02 NOTE — Telephone Encounter (Signed)
It [facility] is refusing that is fine, but they need to be certain to put the [facility] on as soon as she gets home

## 2023-01-02 NOTE — Telephone Encounter (Signed)
Carla [name] was called to be notified that the wound vac should be put on as soon as she gets home. Carla [name] had another question as Carla [name] was sitting beside me and listened in on the call. Carla [name] instructed that Carla [name] could get the sponges put on before leaving the facility so her daughter could connect the wound vac when she gets home, but if she continue to refuse it. Just do wet to dry and let home health connect it tomorrow. When they get there. Home health has been set up by [facility].

## 2023-01-03 ENCOUNTER — Telehealth: Payer: Self-pay

## 2023-01-03 NOTE — Telephone Encounter (Signed)
Tiffany with [facility] [phone]) called to let our office know that patient's picc line is out 32 cm.   I spoke with the patient's daughter [name] who is in the home the patient and [name]. I gave orders to go ahead and remove the picc line, also makes sure patient keeps her scheduled appointment for tomorrow and we will discuss treatment plan then. Dr. Delaine Lame aware of orders to remove the picc line. [name] and [name] verbalized understanding.  Carla [name] [name]

## 2023-01-04 ENCOUNTER — Other Ambulatory Visit (HOSPITAL_COMMUNITY): Payer: Self-pay

## 2023-01-04 ENCOUNTER — Other Ambulatory Visit
Admission: RE | Admit: 2023-01-04 | Discharge: 2023-01-04 | Disposition: A | Discharge status: Home / Self Care | Payer: 59 | Source: Ambulatory Visit | Attending: Infectious Diseases | Admitting: Infectious Diseases

## 2023-01-04 ENCOUNTER — Ambulatory Visit: Payer: 59 | Attending: Infectious Diseases | Admitting: Infectious Diseases

## 2023-01-04 ENCOUNTER — Encounter: Payer: Self-pay | Admitting: Infectious Diseases

## 2023-01-04 ENCOUNTER — Telehealth: Payer: Self-pay

## 2023-01-04 VITALS — BP 110/74 | HR 80 | Temp 97.9°F

## 2023-01-04 DIAGNOSIS — I480 Paroxysmal atrial fibrillation: Secondary | ICD-10-CM | POA: Insufficient documentation

## 2023-01-04 DIAGNOSIS — T8740 Infection of amputation stump, unspecified extremity: Secondary | ICD-10-CM | POA: Insufficient documentation

## 2023-01-04 DIAGNOSIS — T8743 Infection of amputation stump, right lower extremity: Secondary | ICD-10-CM | POA: Insufficient documentation

## 2023-01-04 DIAGNOSIS — J449 Chronic obstructive pulmonary disease, unspecified: Secondary | ICD-10-CM | POA: Diagnosis not present

## 2023-01-04 DIAGNOSIS — I110 Hypertensive heart disease with heart failure: Secondary | ICD-10-CM | POA: Diagnosis not present

## 2023-01-04 DIAGNOSIS — X58XXXA Exposure to other specified factors, initial encounter: Secondary | ICD-10-CM | POA: Insufficient documentation

## 2023-01-04 DIAGNOSIS — D649 Anemia, unspecified: Secondary | ICD-10-CM | POA: Diagnosis not present

## 2023-01-04 DIAGNOSIS — I739 Peripheral vascular disease, unspecified: Secondary | ICD-10-CM | POA: Insufficient documentation

## 2023-01-04 DIAGNOSIS — E785 Hyperlipidemia, unspecified: Secondary | ICD-10-CM | POA: Insufficient documentation

## 2023-01-04 DIAGNOSIS — I509 Heart failure, unspecified: Secondary | ICD-10-CM | POA: Insufficient documentation

## 2023-01-04 LAB — CBC WITH DIFFERENTIAL/PLATELET
Abs Immature Granulocytes: 0.01 10*3/uL (ref 0.00–0.07)
Basophils Absolute: 0 10*3/uL (ref 0.0–0.1)
Basophils Relative: 1 %
Eosinophils Absolute: 0.3 10*3/uL (ref 0.0–0.5)
Eosinophils Relative: 6 %
HCT: 39.5 % (ref 36.0–46.0)
Hemoglobin: 12.2 g/dL (ref 12.0–15.0)
Immature Granulocytes: 0 %
Lymphocytes Relative: 27 %
Lymphs Abs: 1.6 10*3/uL (ref 0.7–4.0)
MCH: 27.4 pg (ref 26.0–34.0)
MCHC: 30.9 g/dL (ref 30.0–36.0)
MCV: 88.6 fL (ref 80.0–100.0)
Monocytes Absolute: 0.4 10*3/uL (ref 0.1–1.0)
Monocytes Relative: 7 %
Neutro Abs: 3.4 10*3/uL (ref 1.7–7.7)
Neutrophils Relative %: 59 %
Platelets: 223 10*3/uL (ref 150–400)
RBC: 4.46 MIL/uL (ref 3.87–5.11)
RDW: 18.8 % — ABNORMAL HIGH (ref 11.5–15.5)
WBC: 5.8 10*3/uL (ref 4.0–10.5)
nRBC: 0 % (ref 0.0–0.2)

## 2023-01-04 LAB — COMPREHENSIVE METABOLIC PANEL
ALT: 16 U/L (ref 0–44)
AST: 16 U/L (ref 15–41)
Albumin: 3.3 g/dL — ABNORMAL LOW (ref 3.5–5.0)
Alkaline Phosphatase: 123 U/L (ref 38–126)
Anion gap: 10 (ref 5–15)
BUN: 18 mg/dL (ref 8–23)
CO2: 26 mmol/L (ref 22–32)
Calcium: 9.1 mg/dL (ref 8.9–10.3)
Chloride: 103 mmol/L (ref 98–111)
Creatinine, Ser: 0.6 mg/dL (ref 0.44–1.00)
GFR, Estimated: >60 mL/min (ref >60)
Glucose, Bld: 103 mg/dL — ABNORMAL HIGH (ref 70–99)
Potassium: 4.4 mmol/L (ref 3.5–5.1)
Sodium: 139 mmol/L (ref 135–145)
Total Bilirubin: 0.4 mg/dL (ref 0.3–1.2)
Total Protein: 7.4 g/dL (ref 6.5–8.1)

## 2023-01-04 LAB — SEDIMENTATION RATE: Sed Rate: 60 mm/hr — ABNORMAL HIGH (ref 0–30)

## 2023-01-04 LAB — C-REACTIVE PROTEIN: CRP: 2.8 mg/dL — ABNORMAL HIGH (ref <1.0)

## 2023-01-04 MED ORDER — OMADACYCLINE TOSYLATE 150 MG PO TABS
300.0000 mg | ORAL_TABLET | Freq: Every day | ORAL | 0 refills | Status: DC
  Filled 2023-01-04 – 2023-01-15 (×2): qty 28, 14d supply, fill #0

## 2023-01-04 NOTE — Telephone Encounter (Signed)
Pam with [facility] aware patient's picc line was removed and patient started on oral abx

## 2023-01-04 NOTE — Patient Instructions (Addendum)
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 Today will give you omadacycline capsule which you will take 3 capsules X 3 days and then 2 capsules every day on a empty stomach- No food for 2-4 hr following that. Will get approval for this medicine- IF it is not approved then will need to go back on IV antibiotic

## 2023-01-04 NOTE — Telephone Encounter (Signed)
Received call from [place] with [facility] requesting update on patient's plan of care. Relayed that she was transitioned to oral antibiotics.   Beryle Flock, RN

## 2023-01-04 NOTE — Progress Notes (Signed)
NAME: Carla [name]  DOB: Aug 28, 1961  MRN: [id number]  Date/Time: 01/04/2023 8:13 PM   Subjective:  Patient here with daughter. Follow-up after recent hospitalization for right AKA stump infection in February 2024  ? INES TOENNIES is a 62 y.o. 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  She also had fever.  She was paralyzed to [facility] on 12/03/2022.  The CT scan of the femur showed a tracking abscess and osteomyelitis.  Culture was sent from the wound and it had ESBL E. coli and Enterococcus and MRSA.[name]  She underwent revision of the amputation on 12/07/2022.  [facility]22 with IV ertapenem and daptomycin for total of 6 weeks , to be given until 01/21/2023.  [facility]/2023.  Yesterday her PICC line had invertedly come out for 32 cm.  And it was removed by the home nurse.  Today patient is here to see me.  She has a wound VAC at the stump..  She does not have any fever or chills.  She is feeling better.  Past Medical History:  Diagnosis Date   Abdominal aortic atherosclerosis ([place])    a. 05/2017 CTA abd/pelvis: significant atherosclerotic dzs of the infrarenal abd Ao w/ some mural thrombus. No aneurysm or dissection.   Acute focal ischemia of small intestine (HCC)    Acute right-sided low back pain with right-sided sciatica 06/13/2017   AKI (acute kidney injury) ([place]) 07/29/2017   Anemia    Anginal pain ([place])    a. 08/2012 Lexiscan MV: EF 54%, non ischemia/infarct.   Anxiety and depression    Baker's cyst of knee, right    a. 07/2016 U/S: 4.1 x 1.4 x 2.9 cystic structure in R poplitetal fossa.   Bell's palsy    CAD (coronary artery disease)    CHF (congestive heart failure) ([place])    a.) TTE 06/20/2017: EF 60-65%, no rwma, G1DD, no source of cardiac emboli.   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 05/2017 CTA Abd/pelvis: apparent thrombus or embolus in prox SMA (70-90%); b. 05/2017 catheter directed tPA, mechanical thrombectomy, and stenting of the SMA.   Essential hypertension with goal blood pressure less than 140/90 01/19/2016   Gastroesophageal reflux disease 01/19/2016   GERD (gastroesophageal reflux disease)    H/O colectomy    History of 2019 novel coronavirus disease (COVID-19) 11/14/2019   History of kidney stones    Hyperlipidemia    Hypertension    Morbid obesity with BMI of 40.0-44.9, adult ([place])    NSTEMI (non-ST elevated myocardial infarction) ([place]) 06/27/2021   a.) high sensitivity troponins trended: 893 --> 1587 --> 1748 --> 868 --> 735 ng/L   Occlusive mesenteric ischemia ([place]) 06/19/2017   Osteoarthritis    PAD (peripheral artery disease) (HCC)    PAF (paroxysmal atrial fibrillation) (HCC)    a.) CHA2DS2-VASc = 6 (sex, CHF, HTN, DVT x2, aortic plaque). b.) rate/rhythm maintained on oral metoprolol tartrate; chronically anticoagulated with standard dose apixaban.   Palpitations    Peripheral edema    Personal history of other malignant neoplasm  of skin 06/01/2016   Pneumonia    Pre-diabetes    Primary osteoarthritis of both knees 06/13/2017   SBO (small bowel obstruction) ([place]) 08/06/2017   Skin cancer of nose    Superior mesenteric artery thrombosis (HCC)    Vitamin D deficiency     Past Surgical History:  Procedure Laterality Date   AMPUTATION Right 04/06/2022   Procedure: AMPUTATION ABOVE KNEE;  Surgeon: Algernon Huxley, MD;  Location: [facility];  Service: Vascular;  Laterality: Right;   AMPUTATION  12/07/2022   Procedure: AMPUTATION ABOVE KNEE REVISION WITH RESECTION OF FEMUR;  Surgeon: Algernon Huxley, MD;  Location: [facility];  Service: Vascular;;   AMPUTATION TOE Left  12/09/2019   Procedure: AMPUTATION TOE MPJ left;  Surgeon: Caroline More, DPM;  Location: [facility];  Service: Podiatry;  Laterality: Left;   APPENDECTOMY     APPLICATION OF WOUND VAC  12/01/2021   Procedure: APPLICATION OF WOUND VAC;  Surgeon: Algernon Huxley, MD;  Location: [facility];  Service: Vascular;;   APPLICATION OF WOUND VAC Right 05/03/2022   Procedure: APPLICATION OF WOUND VAC;  Surgeon: Algernon Huxley, MD;  Location: [facility];  Service: Vascular;  Laterality: Right;   APPLICATION OF WOUND VAC Left 07/23/2022   Procedure: APPLICATION OF WOUND VAC;  Surgeon: Serafina Mitchell, MD;  Location: [facility];  Service: Vascular;  Laterality: Left;  [place]   APPLICATION OF WOUND VAC Right 08/28/2022   Procedure: APPLICATION OF WOUND VAC;  Surgeon: Algernon Huxley, MD;  Location: [facility];  Service: Vascular;  Laterality: Right;   APPLICATION OF WOUND VAC  12/07/2022   Procedure: APPLICATION OF WOUND VAC;  Surgeon: Algernon Huxley, MD;  Location: [facility];  Service: Vascular;;   AXILLARY-FEMORAL BYPASS GRAFT Left 07/23/2022   Procedure: BYPASS GRAFT AXILLA-BIFEMORAL;  Surgeon: Serafina Mitchell, MD;  Location: [facility];  Service: Vascular;  Laterality: Left;   CHOLECYSTECTOMY     COLON SURGERY     COLONOSCOPY WITH PROPOFOL N/A 03/23/2022   Procedure: COLONOSCOPY WITH PROPOFOL;  Surgeon: Jonathon Bellows, MD;  Location[facility];  Service: Gastroenterology;  Laterality: N/A;  rectal bleed   EMBOLECTOMY OR THROMBECTOMY, WITH OR WITHOUT CATHETER; FEMOROPOPLITEAL, AORTOILIAC ARTERY, BY LEG INCISION N/A 06/21/2020   Left - Decomp Fasciotomy Leg; Ant &/Or Lat Compart Only; Midline - Angiography, Visceral, Selective Or Supraselective (With Or Without Flush Aortogram); Left - Revascularize, Endovasc, Open/Percut, Iliac Artery, Unilat, Initial Vessel; W/Translum Stent, W/Angioplasty; Right - Revascularize, Endovasc, Open/Percut, Iliac Artery, Ea Add`L Ipsilateral; W/Translumin Stent, W/Angioplasty; Location [facility];    ENDARTERECTOMY FEMORAL Left 12/01/2021   Procedure: ENDARTERECTOMY FEMORAL;  Surgeon: Algernon Huxley, MD;  Location: [facility];  Service: Vascular;  Laterality: Left;   ENDOVASCULAR REPAIR/STENT GRAFT Left 12/01/2021   Procedure: ENDOVASCULAR REPAIR/STENT GRAFT;  Surgeon: Algernon Huxley, MD;  Location: [place] [facility];  Service: Cardiovascular;  Laterality: Left;   INCISION AND DRAINAGE OF WOUND Right 05/03/2022   Procedure: IRRIGATION AND DEBRIDEMENT RIGHT STUMP;  Surgeon: Algernon Huxley, MD;  Location: [facility];  Service: Vascular;  Laterality: Right;   IRRIGATION AND DEBRIDEMENT ABSCESS Right 08/28/2022   Procedure: AKA IRRIGATION AND DEBRIDEMENT ABSCESS;  Surgeon: Algernon Huxley, MD;  Location: [facility];  Service: Vascular;  Laterality: Right;   LAPAROTOMY N/A 08/08/2017   Procedure: EXPLORATORY LAPAROTOMY POSSIBLE BOWEL RESECTION;  Surgeon: Jules Husbands, MD;  Location: [facility];  Service: General;  Laterality: N/A;   LEFT HEART CATH AND CORONARY ANGIOGRAPHY  N/A 05/17/2021   Procedure: LEFT HEART CATH AND CORONARY ANGIOGRAPHY;  Surgeon: Yolonda Kida, MD;  Location: [place] [facility];  Service: Cardiovascular;  Laterality: N/A;   LOWER EXTREMITY ANGIOGRAPHY Left 11/17/2019   Procedure: LOWER EXTREMITY ANGIOGRAPHY;  Surgeon: Algernon Huxley, MD;  Location: [place] [facility];  Service: Cardiovascular;  Laterality: Left;   LOWER EXTREMITY ANGIOGRAPHY Right 12/13/2020   Procedure: LOWER EXTREMITY ANGIOGRAPHY;  Surgeon: Algernon Huxley, MD;  Location: [place] [facility];  Service: Cardiovascular;  Laterality: Right;   LOWER EXTREMITY ANGIOGRAPHY Right 01/10/2021   Procedure: LOWER EXTREMITY ANGIOGRAPHY;  Surgeon: Algernon Huxley, MD;  Location: [place] [facility];  Service: Cardiovascular;  Laterality: Right;   LOWER EXTREMITY ANGIOGRAPHY Right 01/24/2021   Procedure: LOWER EXTREMITY ANGIOGRAPHY;  Surgeon: Algernon Huxley, MD;  Location: [place] [facility];  Service: Cardiovascular;  Laterality:  Right;   LOWER EXTREMITY ANGIOGRAPHY Bilateral 06/28/2021   Procedure: Lower Extremity Angiography;  Surgeon: Algernon Huxley, MD;  Location: [place] [facility];  Service: Cardiovascular;  Laterality: Bilateral;   LOWER EXTREMITY ANGIOGRAPHY Right 11/28/2021   Procedure: LOWER EXTREMITY ANGIOGRAPHY;  Surgeon: Algernon Huxley, MD;  Location: [place] [facility];  Service: Cardiovascular;  Laterality: Right;   LOWER EXTREMITY ANGIOGRAPHY Left 11/30/2021   Procedure: Lower Extremity Angiography;  Surgeon: Algernon Huxley, MD;  Location: [place] [facility];  Service: Cardiovascular;  Laterality: Left;   LOWER EXTREMITY ANGIOGRAPHY Right 02/22/2022   Procedure: Lower Extremity Angiography;  Surgeon: Algernon Huxley, MD;  Location: [place] [facility];  Service: Cardiovascular;  Laterality: Right;   LOWER EXTREMITY ANGIOGRAPHY Right 03/13/2022   Procedure: Lower Extremity Angiography;  Surgeon: Algernon Huxley, MD;  Location: [place] [facility];  Service: Cardiovascular;  Laterality: Right;   LOWER EXTREMITY ANGIOGRAPHY Right 03/24/2022   Procedure: Lower Extremity Angiography;  Surgeon: Algernon Huxley, MD;  Location: [place] [facility];  Service: Cardiovascular;  Laterality: Right;   LOWER EXTREMITY INTERVENTION N/A 11/19/2019   Procedure: LOWER EXTREMITY INTERVENTION;  Surgeon: Algernon Huxley, MD;  Location: [place] [facility];  Service: Cardiovascular;  Laterality: N/A;   LOWER EXTREMITY INTERVENTION Bilateral 06/29/2021   Procedure: LOWER EXTREMITY INTERVENTION;  Surgeon: Algernon Huxley, MD;  Location: [place] [facility];  Service: Cardiovascular;  Laterality: Bilateral;   TEE WITHOUT CARDIOVERSION N/A 06/22/2017   Procedure: TRANSESOPHAGEAL ECHOCARDIOGRAM (TEE);  Surgeon: Wellington Hampshire, MD;  Location: [facility];  Service: Cardiovascular;  Laterality: N/A;   TOTAL KNEE ARTHROPLASTY Right 07/11/2018   Procedure: TOTAL KNEE ARTHROPLASTY;  Surgeon: Corky Mull, MD;  Location: [facility];  Service: Orthopedics;   Laterality: Right;   VAGINAL HYSTERECTOMY     VISCERAL ARTERY INTERVENTION N/A 06/20/2017   Procedure: Visceral Artery Intervention, possible aortic thrombectomy;  Surgeon: Algernon Huxley, MD;  Location: [place] [facility];  Service: Cardiovascular;  Laterality: N/A;   VISCERAL ARTERY INTERVENTION N/A 01/28/2018   Procedure: VISCERAL ARTERY INTERVENTION;  Surgeon: Algernon Huxley, MD;  Location: [place] [facility];  Service: Cardiovascular;  Laterality: N/A;    Social History   Socioeconomic History   Marital status: Single    Spouse name: Not on file   Number of children: Not on file   Years of education: Not on file   Highest education level: Not on file  Occupational History   Occupation: Unemployed  Tobacco Use   Smoking status: Former    Packs/day: 1  Types: Cigarettes    Quit date: 2023    Years since quitting: 1.2   Smokeless tobacco: Never  Vaping Use   Vaping Use: Never used  Substance and Sexual Activity   Alcohol use: No   Drug use: No   Sexual activity: Not on file  Other Topics Concern   Not on file  Social History Narrative   Lives in [place] with daughter   Social Determinants of Health   Financial Resource Strain: Not on file  Food Insecurity: No Food Insecurity (12/04/2022)   Hunger Vital Sign    Worried About Running Out of Food in the Last Year: Never true    Ran Out of Food in the Last Year: Never true  Transportation Needs: No Transportation Needs (12/04/2022)   PRAPARE - Hydrologist (Medical): No    Lack of Transportation (Non-Medical): No  Physical Activity: Not on file  Stress: Not on file  Social Connections: Not on file  Intimate Partner Violence: Not At Risk (12/04/2022)   Humiliation, Afraid, Rape, and Kick questionnaire    Fear of Current or Ex-Partner: No    Emotionally Abused: No    Physically Abused: No    Sexually Abused: No    Family History  Problem Relation Age of Onset   Hypertension  Mother    Heart disease Mother    Heart attack Mother    Breast cancer Mother    Heart disease Father    Hypertension Father    Breast cancer Paternal Aunt    Allergies  Allergen Reactions   Gabapentin     Tremors    Bactrim [Sulfamethoxazole-Trimethoprim] Itching   I? Current Outpatient Medications  Medication Sig Dispense Refill   alum & mag hydroxide-simeth (MAALOX/MYLANTA) 200-200-20 MG/5ML suspension Take 15-30 mLs by mouth every 2 (two) hours as needed for indigestion. 355 mL 0   apixaban (ELIQUIS) 5 MG TABS tablet Take 1 tablet (5 mg total) by mouth 2 (two) times daily. 60 tablet 11   aspirin EC 81 MG tablet Take 81 mg by mouth daily. Swallow whole.     atorvastatin (LIPITOR) 80 MG tablet Take 80 mg by mouth at bedtime.     bisacodyl (DULCOLAX) 5 MG EC tablet Take 1 tablet (5 mg total) by mouth daily as needed for moderate constipation. 30 tablet 0   COMBIVENT RESPIMAT 20-100 MCG/ACT AERS respimat Inhale 1 puff into the lungs every 6 (six) hours as needed.     esomeprazole (NEXIUM) 20 MG capsule Take 20 mg by mouth daily.     feeding supplement (ENSURE ENLIVE / ENSURE PLUS) LIQD Take 237 mLs by mouth 2 (two) times daily between meals. 237 mL 12   ferrous sulfate 325 (65 FE) MG tablet Take 325 mg by mouth daily.     isosorbide mononitrate (IMDUR) 60 MG 24 hr tablet Take 60 mg by mouth daily.     lidocaine (LIDODERM) 5 % Place 1 patch onto the skin daily. Remove & Discard patch within 12 hours or as directed by MD 30 patch 0   metoprolol tartrate (LOPRESSOR) 25 MG tablet Take 0.5 tablets (12.5 mg total) by mouth 2 (two) times daily. Hold if SBP is 100 or less or HR is less than 60. Notify Provider (Patient taking differently: Take 50 mg by mouth 2 (two) times daily. Hold if SBP is 100 or less or HR is less than 60. Notify Provider)     Omadacycline Tosylate 150 MG TABS  Take 2 tablets (300 mg total) by mouth daily. 28 tablet 0   oxyCODONE-acetaminophen (PERCOCET/ROXICET) 5-325 MG  tablet Take 1-2 tablets by mouth every 4 (four) hours as needed for severe pain. 24 tablet 0   polyethylene glycol (MIRALAX / GLYCOLAX) 17 g packet Take 17 g by mouth daily. 14 each 0   pregabalin (LYRICA) 200 MG capsule Take 1 capsule (200 mg total) by mouth in the morning, at noon, and at bedtime. 90 capsule 3   citalopram (CELEXA) 40 MG tablet Take 1 tablet (40 mg total) by mouth daily. 30 tablet 2   No current facility-administered medications for this visit.     Abtx:  Anti-infectives (From admission, onward)    Start     Dose/Rate Route Frequency Ordered Stop   01/04/23 0000  Omadacycline Tosylate 150 MG TABS        300 mg Oral Daily 01/04/23 1152         REVIEW OF SYSTEMS:  Const: negative fever, negative chills, negative weight loss Eyes: negative diplopia or visual changes, negative eye pain ENT: negative coryza, negative sore throat Resp: negative cough, hemoptysis, dyspnea Cards: negative for chest pain, palpitations, lower extremity edema GU: negative for frequency, dysuria and hematuria GI: Negative for abdominal pain, diarrhea, bleeding, constipation Skin: negative for rash and pruritus Heme: negative for easy bruising and gum/nose bleeding MS: Some weakness Neurolo:negative for headaches, dizziness, vertigo, memory problems  Psych: negative for feelings of anxiety, depression  Endocrine: negative for thyroid, diabetes Allergy/Immunology-gabapentin  and sulfa Objective:  VITALS:  BP 110/74   Pulse 80   Temp 97.9 F (36.6 C) (Temporal)   PHYSICAL EXAM:  General: Alert, cooperative, no distress, in wheelchair Head: Normocephalic, without obvious abnormality, atraumatic. Eyes: Conjunctivae clear, anicteric sclerae. Pupils are equal ENT Nares normal. No drainage or sinus tenderness. Lips, mucosa, and tongue normal. No Thrush Neck: Supple, symmetrical, no adenopathy, thyroid: non tender no carotid bruit and no JVD. Back: No CVA tenderness. Lungs: Clear to  auscultation bilaterally. No Wheezing or Rhonchi. No rales. Heart: S1-S2 Abdomen: Did not examine  Extremities: Right AKA.  Stump has a wound that is covered with VAC Skin: Seborrheic dermatitis like rash on the face Lymph: Cervical, supraclavicular normal. Neurologic: Grossly non-focal  ? Impression/Recommendation Infected right AKA stump.  Status post revision in February Polymicrobial infection with ESBL E. coli, Enterococcus, MRSA.  Patient was on appropriate IV antibiotics from 12/07/2022 and was getting daptomycin and ertapenem IV on discharge.  PICC line fell out yesterday. She needs to get IV antibiotics until 01/21/2023 Will do omadacycline p.o. Will get prior authorization Today gave her 5 days worth of omadacycline samples If the PA is not approved then she will have to get a PICC line and restart IV antibiotic.? _ Today will get labs  HLD on statin Anemia PAD with history of thrombectomy and bypass surgery __  Paroxysmal A-fib  Discussed the management in detail with the patient and her daughter She has a follow-up with vascular next week.  I will reach out to them to get pictures. Follow-up in 3 weeks.  ________________________________________________ Discussed with patient, and daughter Note:  This document was prepared using Systems analyst and may include unintentional dictation errors.

## 2023-01-04 NOTE — Telephone Encounter (Signed)
[facility] Patient Advocate Encounter   Received notification from [employer] Medicare Part D that prior authorization for Carla [name] is required.   PA submitted on 01/04/23 Key [id number] Status is pending    [facility] will continue to follow.   Carla [name], [facility] Patient [facility] Phone: [phone] Fax:  [phone]

## 2023-01-05 ENCOUNTER — Other Ambulatory Visit: Payer: Self-pay | Admitting: Infectious Diseases

## 2023-01-05 ENCOUNTER — Telehealth: Payer: Self-pay

## 2023-01-05 ENCOUNTER — Other Ambulatory Visit (HOSPITAL_COMMUNITY): Payer: Self-pay

## 2023-01-05 MED ORDER — METRONIDAZOLE 500 MG PO TABS: 500.0000 mg | ORAL_TABLET | Freq: Two times a day (BID) | ORAL | 0 refills | Status: DC

## 2023-01-05 NOTE — Telephone Encounter (Signed)
-----   Message from Tsosie Billing, MD sent at 01/04/2023 10:44 PM EDT ----- Please let her know the inflammatory markers are elevated and hence she should continue antibiotics. Please check to see whether she is tolerating omadacyclcine, thx ----- Message ----- From: Interface, Lab In [place] Sent: 01/04/2023  12:30 PM EDT To: Tsosie Billing, MD

## 2023-01-05 NOTE — Progress Notes (Signed)
Sent metronidazole 500mg  PO  1 morning and 1 evening for 2 weeks

## 2023-01-05 NOTE — Telephone Encounter (Signed)
Patient's daughter informed of lab results and verbalized understanding. She is tolerating omadacyclcine okay.  Carla [name]

## 2023-01-06 ENCOUNTER — Other Ambulatory Visit (HOSPITAL_COMMUNITY): Payer: Self-pay

## 2023-01-07 ENCOUNTER — Encounter (INDEPENDENT_AMBULATORY_CARE_PROVIDER_SITE_OTHER): Payer: Self-pay | Admitting: Nurse Practitioner

## 2023-01-07 NOTE — Progress Notes (Incomplete)
Subjective:    Patient ID: Carla [name], female    DOB: 05-16-1961, 62 y.o.   MRN: [id number] Chief Complaint  Patient presents with  . Follow-up    Ultrasound follow up    HPI  Review of Systems  Skin:  Positive for wound.  All other systems reviewed and are negative.      Objective:   Physical Exam Vitals reviewed.  HENT:     Head: Normocephalic.  Cardiovascular:     Rate and Rhythm: Normal rate.  Pulmonary:     Effort: Pulmonary effort is normal.  Musculoskeletal:        General: Tenderness present.  Skin:    General: Skin is warm and dry.  Neurological:     Mental Status: She is alert and oriented to person, place, and time.  Psychiatric:        Mood and Affect: Mood normal.        Behavior: Behavior normal.        Thought Content: Thought content normal.        Judgment: Judgment normal.     BP 113/75 (BP Location: Left Arm)   Pulse 77   Resp 16   Past Medical History:  Diagnosis Date  . Abdominal aortic atherosclerosis ([place])    a. 05/2017 CTA abd/pelvis: significant atherosclerotic dzs of the infrarenal abd Ao w/ some mural thrombus. No aneurysm or dissection.  . Acute focal ischemia of small intestine ([place])   . Acute right-sided low back pain with right-sided sciatica 06/13/2017  . AKI (acute kidney injury) ([place]) 07/29/2017  . Anemia   . Anginal pain ([place])    a. 08/2012 Lexiscan MV: EF 54%, non ischemia/infarct.  . Anxiety and depression   . Baker's cyst of knee, right    a. 07/2016 U/S: 4.1 x 1.4 x 2.9 cystic structure in R poplitetal fossa.  . Bell's palsy   . CAD (coronary artery disease)   . CHF (congestive heart failure) ([place])    a.) TTE 06/20/2017: EF 60-65%, no rwma, G1DD, no source of cardiac emboli.  . Chronic anticoagulation    a.) apixaban  . Chronic idiopathic constipation   . Chronic mesenteric ischemia ([place])   . Chronic pain   . COPD (chronic obstructive pulmonary disease) ([place])   . Dyspnea   . Embolus of superior mesenteric  artery ([place])    a. 05/2017 CTA Abd/pelvis: apparent thrombus or embolus in prox SMA (70-90%); b. 05/2017 catheter directed tPA, mechanical thrombectomy, and stenting of the SMA.  . Essential hypertension with goal blood pressure less than 140/90 01/19/2016  . Gastroesophageal reflux disease 01/19/2016  . GERD (gastroesophageal reflux disease)   . H/O colectomy   . History of 2019 novel coronavirus disease (COVID-19) 11/14/2019  . History of kidney stones   . Hyperlipidemia   . Hypertension   . Morbid obesity with BMI of 40.0-44.9, adult ([place])   . NSTEMI (non-ST elevated myocardial infarction) ([place]) 06/27/2021   a.) high sensitivity troponins trended: 893 --> 1587 --> 1748 --> 868 --> 735 ng/L  . Occlusive mesenteric ischemia ([place]) 06/19/2017  . Osteoarthritis   . PAD (peripheral artery disease) ([place])   . PAF (paroxysmal atrial fibrillation) ([facility])    a.) CHA2DS2-VASc = 6 (sex, CHF, HTN, DVT x2, aortic plaque). b.) rate/rhythm maintained on oral metoprolol tartrate; chronically anticoagulated with standard dose apixaban.  . Palpitations   . Peripheral edema   . Personal history of other malignant neoplasm of skin 06/01/2016  .  Pneumonia   . Pre-diabetes   . Primary osteoarthritis of both knees 06/13/2017  . SBO (small bowel obstruction) ([place]) 08/06/2017  . Skin cancer of nose   . 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 Vitamin D deficiency     Social History   Socioeconomic History  . Marital status: Single    Spouse name: Not on file  . Number of children: Not on file  . Years of education: Not on file  . Highest education level: Not on file  Occupational History  . Occupation: Unemployed  Tobacco Use  . Smoking status: Former    Packs/day: 1    Types: Cigarettes    Quit date: 2023    Years since quitting: 1.2  . Smokeless tobacco: Never  Vaping Use  . Vaping Use: Never used  Substance and Sexual Activity  . Alcohol use: No  . Drug use: No  . Sexual activity:  Not on file  Other Topics Concern  . Not on file  Social History Narrative   Lives in [place] with daughter   Social Determinants of Health   Financial Resource Strain: Not on file  Food Insecurity: No Food Insecurity (12/04/2022)   Hunger Vital Sign   . Worried About Charity fundraiser in the Last Year: Never true   . Ran Out of Food in the Last Year: Never true  Transportation Needs: No Transportation Needs (12/04/2022)   PRAPARE - Transportation   . Lack of Transportation (Medical): No   . Lack of Transportation (Non-Medical): No  Physical Activity: Not on file  Stress: Not on file  Social Connections: Not on file  Intimate Partner Violence: Not At Risk (12/04/2022)   Humiliation, Afraid, Rape, and Kick questionnaire   . Fear of Current or Ex-Partner: No   . Emotionally Abused: No   . Physically Abused: No   . Sexually Abused: No    Past Surgical History:  Procedure Laterality Date  . AMPUTATION Right 04/06/2022   Procedure: AMPUTATION ABOVE KNEE;  Surgeon: Algernon Huxley, MD;  Location: [facility];  Service: Vascular;  Laterality: Right;  . AMPUTATION  12/07/2022   Procedure: AMPUTATION ABOVE KNEE REVISION WITH RESECTION OF FEMUR;  Surgeon: Algernon Huxley, MD;  Location: [facility];  Service: Vascular;;  . AMPUTATION TOE Left 12/09/2019   Procedure: AMPUTATION TOE MPJ left;  Surgeon: Caroline More, DPM;  Location: [facility];  Service: Podiatry;  Laterality: Left;  . APPENDECTOMY    . APPLICATION OF WOUND VAC  12/01/2021   Procedure: APPLICATION OF WOUND VAC;  Surgeon: Algernon Huxley, MD;  Location: [facility];  Service: Vascular;;  . APPLICATION OF WOUND VAC Right 05/03/2022   Procedure: APPLICATION OF WOUND VAC;  Surgeon: Algernon Huxley, MD;  Location: [facility];  Service: Vascular;  Laterality: Right;  . APPLICATION OF WOUND VAC Left 07/23/2022   Procedure: APPLICATION OF WOUND VAC;  Surgeon: Serafina Mitchell, MD;  Location: [facility];  Service: Vascular;  Laterality: Left;  [place]  .  APPLICATION OF WOUND VAC Right 08/28/2022   Procedure: APPLICATION OF WOUND VAC;  Surgeon: Algernon Huxley, MD;  Location: [facility];  Service: Vascular;  Laterality: Right;  . APPLICATION OF WOUND VAC  12/07/2022   Procedure: APPLICATION OF WOUND VAC;  Surgeon: Algernon Huxley, MD;  Location: [facility];  Service: Vascular;;  . AXILLARY-FEMORAL BYPASS GRAFT Left 07/23/2022   Procedure: BYPASS GRAFT AXILLA-BIFEMORAL;  Surgeon: Serafina Mitchell, MD;  Location: [facility]  ORS;  Service: Vascular;  Laterality: Left;  . CHOLECYSTECTOMY    . COLON SURGERY    . COLONOSCOPY WITH PROPOFOL N/A 03/23/2022   Procedure: COLONOSCOPY WITH PROPOFOL;  Surgeon: Jonathon Bellows, MD;  Location: [facility];  Service: Gastroenterology;  Laterality: N/A;  rectal bleed  . EMBOLECTOMY OR THROMBECTOMY, WITH OR WITHOUT CATHETER; FEMOROPOPLITEAL, AORTOILIAC ARTERY, BY LEG INCISION N/A 06/21/2020   Left - Decomp Fasciotomy Leg; Ant &/Or Lat Compart Only; Midline - Angiography, Visceral, Selective Or Supraselective (With Or Without Flush Aortogram); Left - Revascularize, Endovasc, Open/Percut, Iliac Artery, Unilat, Initial Vessel; W/Translum Stent, W/Angioplasty; Right - Revascularize, Endovasc, Open/Percut, Iliac Artery, Ea Add`L Ipsilateral; W/Translumin Stent, W/Angioplasty; Location [facility];  . ENDARTERECTOMY FEMORAL Left 12/01/2021   Procedure: ENDARTERECTOMY FEMORAL;  Surgeon: Algernon Huxley, MD;  Location: [facility];  Service: Vascular;  Laterality: Left;  . ENDOVASCULAR REPAIR/STENT GRAFT Left 12/01/2021   Procedure: ENDOVASCULAR REPAIR/STENT GRAFT;  Surgeon: Algernon Huxley, MD;  Location: [place] [facility];  Service: Cardiovascular;  Laterality: Left;  . INCISION AND DRAINAGE OF WOUND Right 05/03/2022   Procedure: IRRIGATION AND DEBRIDEMENT RIGHT STUMP;  Surgeon: Algernon Huxley, MD;  Location: [facility];  Service: Vascular;  Laterality: Right;  . IRRIGATION AND DEBRIDEMENT ABSCESS Right 08/28/2022   Procedure: AKA IRRIGATION AND DEBRIDEMENT ABSCESS;   Surgeon: Algernon Huxley, MD;  Location: [facility];  Service: Vascular;  Laterality: Right;  . LAPAROTOMY N/A 08/08/2017   Procedure: EXPLORATORY LAPAROTOMY POSSIBLE BOWEL RESECTION;  Surgeon: Jules Husbands, MD;  Location: [facility];  Service: General;  Laterality: N/A;  . LEFT HEART CATH AND CORONARY ANGIOGRAPHY N/A 05/17/2021   Procedure: LEFT HEART CATH AND CORONARY ANGIOGRAPHY;  Surgeon: Yolonda Kida, MD;  Location: [place] [facility];  Service: Cardiovascular;  Laterality: N/A;  . LOWER EXTREMITY ANGIOGRAPHY Left 11/17/2019   Procedure: LOWER EXTREMITY ANGIOGRAPHY;  Surgeon: Algernon Huxley, MD;  Location: [place] [facility];  Service: Cardiovascular;  Laterality: Left;  . LOWER EXTREMITY ANGIOGRAPHY Right 12/13/2020   Procedure: LOWER EXTREMITY ANGIOGRAPHY;  Surgeon: Algernon Huxley, MD;  Location: [place] [facility];  Service: Cardiovascular;  Laterality: Right;  . LOWER EXTREMITY ANGIOGRAPHY Right 01/10/2021   Procedure: LOWER EXTREMITY ANGIOGRAPHY;  Surgeon: Algernon Huxley, MD;  Location: [place] [facility];  Service: Cardiovascular;  Laterality: Right;  . LOWER EXTREMITY ANGIOGRAPHY Right 01/24/2021   Procedure: LOWER EXTREMITY ANGIOGRAPHY;  Surgeon: Algernon Huxley, MD;  Location: [place] [facility];  Service: Cardiovascular;  Laterality: Right;  . LOWER EXTREMITY ANGIOGRAPHY Bilateral 06/28/2021   Procedure: Lower Extremity Angiography;  Surgeon: Algernon Huxley, MD;  Location: [place] [facility];  Service: Cardiovascular;  Laterality: Bilateral;  . LOWER EXTREMITY ANGIOGRAPHY Right 11/28/2021   Procedure: LOWER EXTREMITY ANGIOGRAPHY;  Surgeon: Algernon Huxley, MD;  Location: [place] [facility];  Service: Cardiovascular;  Laterality: Right;  . LOWER EXTREMITY ANGIOGRAPHY Left 11/30/2021   Procedure: Lower Extremity Angiography;  Surgeon: Algernon Huxley, MD;  Location: [place] [facility];  Service: Cardiovascular;  Laterality: Left;  . LOWER EXTREMITY ANGIOGRAPHY Right 02/22/2022   Procedure:  Lower Extremity Angiography;  Surgeon: Algernon Huxley, MD;  Location: [place] [facility];  Service: Cardiovascular;  Laterality: Right;  . LOWER EXTREMITY ANGIOGRAPHY Right 03/13/2022   Procedure: Lower Extremity Angiography;  Surgeon: Algernon Huxley, MD;  Location: [place] [facility];  Service: Cardiovascular;  Laterality: Right;  . LOWER EXTREMITY ANGIOGRAPHY Right 03/24/2022   Procedure: Lower Extremity  Angiography;  Surgeon: Algernon Huxley, MD;  Location: [place] [facility];  Service: Cardiovascular;  Laterality: Right;  . LOWER EXTREMITY INTERVENTION N/A 11/19/2019   Procedure: LOWER EXTREMITY INTERVENTION;  Surgeon: Algernon Huxley, MD;  Location: [place] [facility];  Service: Cardiovascular;  Laterality: N/A;  . LOWER EXTREMITY INTERVENTION Bilateral 06/29/2021   Procedure: LOWER EXTREMITY INTERVENTION;  Surgeon: Algernon Huxley, MD;  Location: [place] [facility];  Service: Cardiovascular;  Laterality: Bilateral;  . TEE WITHOUT CARDIOVERSION N/A 06/22/2017   Procedure: TRANSESOPHAGEAL ECHOCARDIOGRAM (TEE);  Surgeon: Wellington Hampshire, MD;  Location: [facility];  Service: Cardiovascular;  Laterality: N/A;  . TOTAL KNEE ARTHROPLASTY Right 07/11/2018   Procedure: TOTAL KNEE ARTHROPLASTY;  Surgeon: Corky Mull, MD;  Location: [facility];  Service: Orthopedics;  Laterality: Right;  [name] VAGINAL HYSTERECTOMY    . VISCERAL ARTERY INTERVENTION N/A 06/20/2017   Procedure: Visceral Artery Intervention, possible aortic thrombectomy;  Surgeon: Algernon Huxley, MD;  Location: [place] [facility];  Service: Cardiovascular;  Laterality: N/A;  . VISCERAL ARTERY INTERVENTION N/A 01/28/2018   Procedure: VISCERAL ARTERY INTERVENTION;  Surgeon: Algernon Huxley, MD;  Location: [place] [facility];  Service: Cardiovascular;  Laterality: N/A;    Family History  Problem Relation Age of Onset  . Hypertension Mother   . Heart disease Mother   . Heart attack Mother   . Breast cancer Mother   . Heart disease Father   .  Hypertension Father   . Breast cancer Paternal Aunt     Allergies  Allergen Reactions  . Gabapentin     Tremors   . Bactrim [Sulfamethoxazole-Trimethoprim] Itching       Latest Ref Rng & Units 01/04/2023   12:16 PM 12/11/2022    3:55 AM 12/10/2022    4:17 AM  CBC  WBC 4.0 - 10.5 K/uL 5.8  7.4  6.9   Hemoglobin 12.0 - 15.0 g/dL 12.2  10.6  11.4   Hematocrit 36.0 - 46.0 % 39.5  33.9  36.7   Platelets 150 - 400 K/uL 223  236  218       CMP     Component Value Date/Time   NA 139 01/04/2023 1216   NA 140 02/12/2015 1137   K 4.4 01/04/2023 1216   K 2.9 (L) 02/12/2015 1137   CL 103 01/04/2023 1216   CL 107 02/12/2015 1137   CO2 26 01/04/2023 1216   CO2 27 02/12/2015 1137   GLUCOSE 103 (H) 01/04/2023 1216   GLUCOSE 120 (H) 02/12/2015 1137   BUN 18 01/04/2023 1216   BUN 15 02/12/2015 1137   CREATININE 0.60 01/04/2023 1216   CREATININE 0.78 02/12/2015 1137   CALCIUM 9.1 01/04/2023 1216   CALCIUM 9.0 02/12/2015 1137   PROT 7.4 01/04/2023 1216   PROT 7.0 09/14/2020 1139   PROT 7.1 01/19/2015 1112   ALBUMIN 3.3 (L) 01/04/2023 1216   ALBUMIN 3.9 09/14/2020 1139   ALBUMIN 3.8 01/19/2015 1112   AST 16 01/04/2023 1216   AST 17 01/19/2015 1112   ALT 16 01/04/2023 1216   ALT 11 (L) 01/19/2015 1112   ALKPHOS 123 01/04/2023 1216   ALKPHOS 84 01/19/2015 1112   BILITOT 0.4 01/04/2023 1216   BILITOT <0.2 09/14/2020 1139   BILITOT 0.5 01/19/2015 1112   GFRNONAA >60 01/04/2023 1216   GFRNONAA >60 02/12/2015 1137   GFRAA >60 11/23/2019 1330   GFRAA >60 02/12/2015 1137     No results found.  Assessment & Plan:   1. Peripheral arterial disease with history of revascularization ([place]) I suspect that the pain in the patient's left heel is more so related to irritation from constantly being laying in the bed.  The patient is advised to try to float her heels.  This information will also be communicated with her nursing facility.  There is no ulceration or bruising of the heel  or concern for ulceration or deep tissue injury.  Will have the patient return in 3 weeks - VAS [place] LOWER EXTREMITY ARTERIAL DUPLEX   Current Outpatient Medications on File Prior to Visit  Medication Sig Dispense Refill  . alum & mag hydroxide-simeth (MAALOX/MYLANTA) 200-200-20 MG/5ML suspension Take 15-30 mLs by mouth every 2 (two) hours as needed for indigestion. 355 mL 0  . apixaban (ELIQUIS) 5 MG TABS tablet Take 1 tablet (5 mg total) by mouth 2 (two) times daily. 60 tablet 11  . aspirin EC 81 MG tablet Take 81 mg by mouth daily. Swallow whole.    [name] atorvastatin (LIPITOR) 80 MG tablet Take 80 mg by mouth at bedtime.    . bisacodyl (DULCOLAX) 5 MG EC tablet Take 1 tablet (5 mg total) by mouth daily as needed for moderate constipation. 30 tablet 0  . COMBIVENT RESPIMAT 20-100 MCG/ACT AERS respimat Inhale 1 puff into the lungs every 6 (six) hours as needed.    [name] esomeprazole (NEXIUM) 20 MG capsule Take 20 mg by mouth daily.    . feeding supplement (ENSURE ENLIVE / ENSURE PLUS) LIQD Take 237 mLs by mouth 2 (two) times daily between meals. 237 mL 12  . ferrous sulfate 325 (65 FE) MG tablet Take 325 mg by mouth daily.    . isosorbide mononitrate (IMDUR) 60 MG 24 hr tablet Take 60 mg by mouth daily.    [name] lidocaine (LIDODERM) 5 % Place 1 patch onto the skin daily. Remove & Discard patch within 12 hours or as directed by MD 30 patch 0  . metoprolol tartrate (LOPRESSOR) 25 MG tablet Take 0.5 tablets (12.5 mg total) by mouth 2 (two) times daily. Hold if SBP is 100 or less or HR is less than 60. Notify Provider (Patient taking differently: Take 50 mg by mouth 2 (two) times daily. Hold if SBP is 100 or less or HR is less than 60. Notify Provider)    . oxyCODONE-acetaminophen (PERCOCET/ROXICET) 5-325 MG tablet Take 1-2 tablets by mouth every 4 (four) hours as needed for severe pain. 24 tablet 0  . polyethylene glycol (MIRALAX / GLYCOLAX) 17 g packet Take 17 g by mouth daily. 14 each 0  . pregabalin (LYRICA)  200 MG capsule Take 1 capsule (200 mg total) by mouth in the morning, at noon, and at bedtime. 90 capsule 3  . citalopram (CELEXA) 40 MG tablet Take 1 tablet (40 mg total) by mouth daily. 30 tablet 2   No current facility-administered medications on file prior to visit.    There are no Patient Instructions on file for this visit. No follow-ups on file.   Kris Hartmann, NP

## 2023-01-07 NOTE — Progress Notes (Signed)
Subjective:    Patient ID: Carla [name], female    DOB: 1961-02-11, 62 y.o.   MRN: [id number] Chief Complaint  Patient presents with   Follow-up    Ultrasound follow up    Carla [name] is a 62 year old female who returns today following recent debridement of her left above-knee amputation.  It was initially thought that the patient had osteomyelitis but the patient has not grown anything from the bone culture.  However she is still completing her antibiotics.  Her biggest complaint right now is that her left heel is sore.  There is no wound.  She notes that it is tender when she lays down on the bed.  The patient is very tearful and anxious.  She previously had a axillary bypass graft to her common femoral artery.  Imaging is limited on the bypass but the anastomosis to the common femoral artery is patent.  Or lower extremity arterial vessels are patent and flow is monophasic.    Review of Systems  Skin:  Positive for wound.  Neurological:  Positive for weakness.  All other systems reviewed and are negative.      Objective:   Physical Exam Vitals reviewed.  HENT:     Head: Normocephalic.  Cardiovascular:     Rate and Rhythm: Normal rate.  Pulmonary:     Effort: Pulmonary effort is normal.  Musculoskeletal:        General: Tenderness present.  Skin:    General: Skin is warm and dry.  Neurological:     Mental Status: She is alert and oriented to person, place, and time.  Psychiatric:        Mood and Affect: Mood normal.        Behavior: Behavior normal.        Thought Content: Thought content normal.        Judgment: Judgment normal.     BP 113/75 (BP Location: Left Arm)   Pulse 77   Resp 16   Past Medical History:  Diagnosis Date   Abdominal aortic atherosclerosis ([place])    a. 05/2017 CTA abd/pelvis: significant atherosclerotic dzs of the infrarenal abd Ao w/ some mural thrombus. No aneurysm or dissection.   Acute focal ischemia of small intestine (HCC)    Acute  right-sided low back pain with right-sided sciatica 06/13/2017   AKI (acute kidney injury) ([place]) 07/29/2017   Anemia    Anginal pain ([place])    a. 08/2012 Lexiscan MV: EF 54%, non ischemia/infarct.   Anxiety and depression    Baker's cyst of knee, right    a. 07/2016 U/S: 4.1 x 1.4 x 2.9 cystic structure in R poplitetal fossa.   Bell's palsy    CAD (coronary artery disease)    CHF (congestive heart failure) ([place])    a.) TTE 06/20/2017: EF 60-65%, no rwma, G1DD, no source of cardiac emboli.   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 05/2017 CTA Abd/pelvis: apparent thrombus or embolus in prox SMA (70-90%); b. 05/2017 catheter directed tPA, mechanical thrombectomy, and stenting of the SMA.   Essential hypertension with goal blood pressure less than 140/90 01/19/2016   Gastroesophageal reflux disease 01/19/2016   GERD (gastroesophageal reflux disease)    H/O colectomy  History of 2019 novel coronavirus disease (COVID-19) 11/14/2019   History of kidney stones    Hyperlipidemia    Hypertension    Morbid obesity with BMI of 40.0-44.9, adult (HCC)    NSTEMI (non-ST elevated myocardial infarction) ([place]) 06/27/2021   a.) high sensitivity troponins trended: 893 --> 1587 --> 1748 --> 868 --> 735 ng/L   Occlusive mesenteric ischemia (HCC) 06/19/2017   Osteoarthritis    PAD (peripheral artery disease) (HCC)    PAF (paroxysmal atrial fibrillation) (HCC)    a.) CHA2DS2-VASc = 6 (sex, CHF, HTN, DVT x2, aortic plaque). b.) rate/rhythm maintained on oral metoprolol tartrate; chronically anticoagulated with standard dose apixaban.   Palpitations    Peripheral edema    Personal history of other malignant neoplasm of skin 06/01/2016   Pneumonia    Pre-diabetes    Primary osteoarthritis of both  knees 06/13/2017   SBO (small bowel obstruction) ([place]) 08/06/2017   Skin cancer of nose    Superior mesenteric artery thrombosis (HCC)    Vitamin D deficiency     Social History   Socioeconomic History   Marital status: Single    Spouse name: Not on file   Number of children: Not on file   Years of education: Not on file   Highest education level: Not on file  Occupational History   Occupation: Unemployed  Tobacco Use   Smoking status: Former    Packs/day: 1    Types: Cigarettes    Quit date: 2023    Years since quitting: 1.2   Smokeless tobacco: Never  Vaping Use   Vaping Use: Never used  Substance and Sexual Activity   Alcohol use: No   Drug use: No   Sexual activity: Not on file  Other Topics Concern   Not on file  Social History Narrative   Lives in [place] daughter   Social Determinants of Health   Financial Resource Strain: Not on file  Food Insecurity: No Food Insecurity (12/04/2022)   Hunger Vital Sign    Worried About Running Out of Food in the Last Year: Never true    Ran Out of Food in the Last Year: Never true  Transportation Needs: No Transportation Needs (12/04/2022)   PRAPARE - Hydrologist (Medical): No    Lack of Transportation (Non-Medical): No  Physical Activity: Not on file  Stress: Not on file  Social Connections: Not on file  Intimate Partner Violence: Not At Risk (12/04/2022)   Humiliation, Afraid, Rape, and Kick questionnaire    Fear of Current or Ex-Partner: No    Emotionally Abused: No    Physically Abused: No    Sexually Abused: No    Past Surgical History:  Procedure Laterality Date   AMPUTATION Right 04/06/2022   Procedure: AMPUTATION ABOVE KNEE;  Surgeon: Algernon Huxley, MD;  Location: [facility];  Service: Vascular;  Laterality: Right;   AMPUTATION  12/07/2022   Procedure: AMPUTATION ABOVE KNEE REVISION WITH RESECTION OF FEMUR;  Surgeon: Algernon Huxley, MD;  Location: [facility];  Service:  Vascular;;   AMPUTATION TOE Left 12/09/2019   Procedure: AMPUTATION TOE MPJ left;  Surgeon: Caroline More, DPM;  Location: [facility];  Service: Podiatry;  Laterality: Left;   APPENDECTOMY     APPLICATION OF WOUND VAC  12/01/2021   Procedure: APPLICATION OF WOUND VAC;  Surgeon: Algernon Huxley, MD;  Location: [facility];  Service: Vascular;;   APPLICATION OF WOUND VAC Right 05/03/2022  Procedure: APPLICATION OF WOUND VAC;  Surgeon: Algernon Huxley, MD;  Location: [facility];  Service: Vascular;  Laterality: Right;   APPLICATION OF WOUND VAC Left 07/23/2022   Procedure: APPLICATION OF WOUND VAC;  Surgeon: Serafina Mitchell, MD;  Location: [facility];  Service: Vascular;  Laterality: Left;  [place]   APPLICATION OF WOUND VAC Right 08/28/2022   Procedure: APPLICATION OF WOUND VAC;  Surgeon: Algernon Huxley, MD;  Location: [facility];  Service: Vascular;  Laterality: Right;   APPLICATION OF WOUND VAC  12/07/2022   Procedure: APPLICATION OF WOUND VAC;  Surgeon: Algernon Huxley, MD;  Location: [facility];  Service: Vascular;;   AXILLARY-FEMORAL BYPASS GRAFT Left 07/23/2022   Procedure: BYPASS GRAFT AXILLA-BIFEMORAL;  Surgeon: Serafina Mitchell, MD;  Location: [facility];  Service: Vascular;  Laterality: Left;   CHOLECYSTECTOMY     COLON SURGERY     COLONOSCOPY WITH PROPOFOL N/A 03/23/2022   Procedure: COLONOSCOPY WITH PROPOFOL;  Surgeon: Jonathon Bellows, MD;  Location: [facility];  Service: Gastroenterology;  Laterality: N/A;  rectal bleed   EMBOLECTOMY OR THROMBECTOMY, WITH OR WITHOUT CATHETER; FEMOROPOPLITEAL, AORTOILIAC ARTERY, BY LEG INCISION N/A 06/21/2020   Left - Decomp Fasciotomy Leg; Ant &/Or Lat Compart Only; Midline - Angiography, Visceral, Selective Or Supraselective (With Or Without Flush Aortogram); Left - Revascularize, Endovasc, Open/Percut, Iliac Artery, Unilat, Initial Vessel; W/Translum Stent, W/Angioplasty; Right - Revascularize, Endovasc, Open/Percut, Iliac Artery, Ea Add`L Ipsilateral; W/Translumin Stent,  W/Angioplasty; Location [facility];   ENDARTERECTOMY FEMORAL Left 12/01/2021   Procedure: ENDARTERECTOMY FEMORAL;  Surgeon: Algernon Huxley, MD;  Location: [facility];  Service: Vascular;  Laterality: Left;   ENDOVASCULAR REPAIR/STENT GRAFT Left 12/01/2021   Procedure: ENDOVASCULAR REPAIR/STENT GRAFT;  Surgeon: Algernon Huxley, MD;  Location: [place] [facility];  Service: Cardiovascular;  Laterality: Left;   INCISION AND DRAINAGE OF WOUND Right 05/03/2022   Procedure: IRRIGATION AND DEBRIDEMENT RIGHT STUMP;  Surgeon: Algernon Huxley, MD;  Location: [facility];  Service: Vascular;  Laterality: Right;   IRRIGATION AND DEBRIDEMENT ABSCESS Right 08/28/2022   Procedure: AKA IRRIGATION AND DEBRIDEMENT ABSCESS;  Surgeon: Algernon Huxley, MD;  Location: [facility];  Service: Vascular;  Laterality: Right;   LAPAROTOMY N/A 08/08/2017   Procedure: EXPLORATORY LAPAROTOMY POSSIBLE BOWEL RESECTION;  Surgeon: Jules Husbands, MD;  Location: [facility];  Service: General;  Laterality: N/A;   LEFT HEART CATH AND CORONARY ANGIOGRAPHY N/A 05/17/2021   Procedure: LEFT HEART CATH AND CORONARY ANGIOGRAPHY;  Surgeon: Yolonda Kida, MD;  Location: [place] [facility];  Service: Cardiovascular;  Laterality: N/A;   LOWER EXTREMITY ANGIOGRAPHY Left 11/17/2019   Procedure: LOWER EXTREMITY ANGIOGRAPHY;  Surgeon: Algernon Huxley, MD;  Location: [place] [facility];  Service: Cardiovascular;  Laterality: Left;   LOWER EXTREMITY ANGIOGRAPHY Right 12/13/2020   Procedure: LOWER EXTREMITY ANGIOGRAPHY;  Surgeon: Algernon Huxley, MD;  Location: [place] [facility];  Service: Cardiovascular;  Laterality: Right;   LOWER EXTREMITY ANGIOGRAPHY Right 01/10/2021   Procedure: LOWER EXTREMITY ANGIOGRAPHY;  Surgeon: Algernon Huxley, MD;  Location: [place] [facility];  Service: Cardiovascular;  Laterality: Right;   LOWER EXTREMITY ANGIOGRAPHY Right 01/24/2021   Procedure: LOWER EXTREMITY ANGIOGRAPHY;  Surgeon: Algernon Huxley, MD;  Location: [place] [facility];  Service:  Cardiovascular;  Laterality: Right;   LOWER EXTREMITY ANGIOGRAPHY Bilateral 06/28/2021   Procedure: Lower Extremity Angiography;  Surgeon: Algernon Huxley, MD;  Location: [place] [facility];  Service: Cardiovascular;  Laterality: Bilateral;  LOWER EXTREMITY ANGIOGRAPHY Right 11/28/2021   Procedure: LOWER EXTREMITY ANGIOGRAPHY;  Surgeon: Algernon Huxley, MD;  Location: [place] [facility];  Service: Cardiovascular;  Laterality: Right;   LOWER EXTREMITY ANGIOGRAPHY Left 11/30/2021   Procedure: Lower Extremity Angiography;  Surgeon: Algernon Huxley, MD;  Location: [place] [facility];  Service: Cardiovascular;  Laterality: Left;   LOWER EXTREMITY ANGIOGRAPHY Right 02/22/2022   Procedure: Lower Extremity Angiography;  Surgeon: Algernon Huxley, MD;  Location: [place] [facility];  Service: Cardiovascular;  Laterality: Right;   LOWER EXTREMITY ANGIOGRAPHY Right 03/13/2022   Procedure: Lower Extremity Angiography;  Surgeon: Algernon Huxley, MD;  Location: [place] [facility];  Service: Cardiovascular;  Laterality: Right;   LOWER EXTREMITY ANGIOGRAPHY Right 03/24/2022   Procedure: Lower Extremity Angiography;  Surgeon: Algernon Huxley, MD;  Location: [place] [facility];  Service: Cardiovascular;  Laterality: Right;   LOWER EXTREMITY INTERVENTION N/A 11/19/2019   Procedure: LOWER EXTREMITY INTERVENTION;  Surgeon: Algernon Huxley, MD;  Location: [place] [facility];  Service: Cardiovascular;  Laterality: N/A;   LOWER EXTREMITY INTERVENTION Bilateral 06/29/2021   Procedure: LOWER EXTREMITY INTERVENTION;  Surgeon: Algernon Huxley, MD;  Location: [place] [facility];  Service: Cardiovascular;  Laterality: Bilateral;   TEE WITHOUT CARDIOVERSION N/A 06/22/2017   Procedure: TRANSESOPHAGEAL ECHOCARDIOGRAM (TEE);  Surgeon: Wellington [place], MD;  Location: [facility];  Service: Cardiovascular;  Laterality: N/A;   TOTAL KNEE ARTHROPLASTY Right 07/11/2018   Procedure: TOTAL KNEE ARTHROPLASTY;  Surgeon: Corky Mull, MD;  Location: [facility];   Service: Orthopedics;  Laterality: Right;   VAGINAL HYSTERECTOMY     VISCERAL ARTERY INTERVENTION N/A 06/20/2017   Procedure: Visceral Artery Intervention, possible aortic thrombectomy;  Surgeon: Algernon Huxley, MD;  Location: [place] [facility];  Service: Cardiovascular;  Laterality: N/A;   VISCERAL ARTERY INTERVENTION N/A 01/28/2018   Procedure: VISCERAL ARTERY INTERVENTION;  Surgeon: Algernon Huxley, MD;  Location: [place] [facility];  Service: Cardiovascular;  Laterality: N/A;    Family History  Problem Relation Age of Onset   Hypertension Mother    Heart disease Mother    Heart attack Mother    Breast cancer Mother    Heart disease Father    Hypertension Father    Breast cancer Paternal Aunt     Allergies  Allergen Reactions   Gabapentin     Tremors    Bactrim [Sulfamethoxazole-Trimethoprim] Itching       Latest Ref Rng & Units 01/04/2023   12:16 PM 12/11/2022    3:55 AM 12/10/2022    4:17 AM  CBC  WBC 4.0 - 10.5 K/uL 5.8  7.4  6.9   Hemoglobin 12.0 - 15.0 g/dL 12.2  10.6  11.4   Hematocrit 36.0 - 46.0 % 39.5  33.9  36.7   Platelets 150 - 400 K/uL 223  236  218       CMP     Component Value Date/Time   NA 139 01/04/2023 1216   NA 140 02/12/2015 1137   K 4.4 01/04/2023 1216   K 2.9 (L) 02/12/2015 1137   CL 103 01/04/2023 1216   CL 107 02/12/2015 1137   CO2 26 01/04/2023 1216   CO2 27 02/12/2015 1137   GLUCOSE 103 (H) 01/04/2023 1216   GLUCOSE 120 (H) 02/12/2015 1137   BUN 18 01/04/2023 1216   BUN 15 02/12/2015 1137   CREATININE 0.60 01/04/2023 1216   CREATININE 0.78 02/12/2015 1137   CALCIUM 9.1  01/04/2023 1216   CALCIUM 9.0 02/12/2015 1137   PROT 7.4 01/04/2023 1216   PROT 7.0 09/14/2020 1139   PROT 7.1 01/19/2015 1112   ALBUMIN 3.3 (L) 01/04/2023 1216   ALBUMIN 3.9 09/14/2020 1139   ALBUMIN 3.8 01/19/2015 1112   AST 16 01/04/2023 1216   AST 17 01/19/2015 1112   ALT 16 01/04/2023 1216   ALT 11 (L) 01/19/2015 1112   ALKPHOS 123 01/04/2023 1216    ALKPHOS 84 01/19/2015 1112   BILITOT 0.4 01/04/2023 1216   BILITOT <0.2 09/14/2020 1139   BILITOT 0.5 01/19/2015 1112   GFRNONAA >60 01/04/2023 1216   GFRNONAA >60 02/12/2015 1137   GFRAA >60 11/23/2019 1330   GFRAA >60 02/12/2015 1137     No results found.     Assessment & Plan:   1. Peripheral arterial disease with history of revascularization ([place]) I suspect that the pain in the patient's left heel is more so related to irritation from constantly being laying in the bed.  The patient is advised to try to float her heels.  This information will also be communicated with her nursing facility.  There is no ulceration or bruising of the heel or concern for ulceration or deep tissue injury.  Will have the patient return in 3 weeks - VAS [place] LOWER EXTREMITY ARTERIAL DUPLEX   Current Outpatient Medications on File Prior to Visit  Medication Sig Dispense Refill   alum & mag hydroxide-simeth (MAALOX/MYLANTA) 200-200-20 MG/5ML suspension Take 15-30 mLs by mouth every 2 (two) hours as needed for indigestion. 355 mL 0   apixaban (ELIQUIS) 5 MG TABS tablet Take 1 tablet (5 mg total) by mouth 2 (two) times daily. 60 tablet 11   aspirin EC 81 MG tablet Take 81 mg by mouth daily. Swallow whole.     atorvastatin (LIPITOR) 80 MG tablet Take 80 mg by mouth at bedtime.     bisacodyl (DULCOLAX) 5 MG EC tablet Take 1 tablet (5 mg total) by mouth daily as needed for moderate constipation. 30 tablet 0   COMBIVENT RESPIMAT 20-100 MCG/ACT AERS respimat Inhale 1 puff into the lungs every 6 (six) hours as needed.     esomeprazole (NEXIUM) 20 MG capsule Take 20 mg by mouth daily.     feeding supplement (ENSURE ENLIVE / ENSURE PLUS) LIQD Take 237 mLs by mouth 2 (two) times daily between meals. 237 mL 12   ferrous sulfate 325 (65 FE) MG tablet Take 325 mg by mouth daily.     isosorbide mononitrate (IMDUR) 60 MG 24 hr tablet Take 60 mg by mouth daily.     lidocaine (LIDODERM) 5 % Place 1 patch onto the skin  daily. Remove & Discard patch within 12 hours or as directed by MD 30 patch 0   metoprolol tartrate (LOPRESSOR) 25 MG tablet Take 0.5 tablets (12.5 mg total) by mouth 2 (two) times daily. Hold if SBP is 100 or less or HR is less than 60. Notify Provider (Patient taking differently: Take 50 mg by mouth 2 (two) times daily. Hold if SBP is 100 or less or HR is less than 60. Notify Provider)     oxyCODONE-acetaminophen (PERCOCET/ROXICET) 5-325 MG tablet Take 1-2 tablets by mouth every 4 (four) hours as needed for severe pain. 24 tablet 0   polyethylene glycol (MIRALAX / GLYCOLAX) 17 g packet Take 17 g by mouth daily. 14 each 0   pregabalin (LYRICA) 200 MG capsule Take 1 capsule (200 mg total) by mouth in the  morning, at noon, and at bedtime. 90 capsule 3   citalopram (CELEXA) 40 MG tablet Take 1 tablet (40 mg total) by mouth daily. 30 tablet 2   No current facility-administered medications on file prior to visit.    There are no Patient Instructions on file for this visit. No follow-ups on file.   Kris Hartmann, NP

## 2023-01-11 ENCOUNTER — Encounter (INDEPENDENT_AMBULATORY_CARE_PROVIDER_SITE_OTHER): Payer: Self-pay | Admitting: Nurse Practitioner

## 2023-01-11 ENCOUNTER — Ambulatory Visit (INDEPENDENT_AMBULATORY_CARE_PROVIDER_SITE_OTHER): Payer: 59 | Admitting: Nurse Practitioner

## 2023-01-11 VITALS — BP 95/63 | HR 71 | Resp 16

## 2023-01-11 DIAGNOSIS — I70222 Atherosclerosis of native arteries of extremities with rest pain, left leg: Secondary | ICD-10-CM

## 2023-01-11 DIAGNOSIS — Z89611 Acquired absence of right leg above knee: Secondary | ICD-10-CM

## 2023-01-12 ENCOUNTER — Ambulatory Visit (INDEPENDENT_AMBULATORY_CARE_PROVIDER_SITE_OTHER): Payer: 59 | Admitting: Vascular Surgery

## 2023-01-12 ENCOUNTER — Encounter (INDEPENDENT_AMBULATORY_CARE_PROVIDER_SITE_OTHER): Payer: Self-pay | Admitting: Nurse Practitioner

## 2023-01-12 NOTE — Progress Notes (Signed)
Subjective:    Patient ID: Carla [name], female    DOB: 1961-05-15, 62 y.o.   MRN: [id number] Chief Complaint  Patient presents with   Follow-up    [facility] 3 week wound check    Carla [name] is a 62 year old female who returns today for follow-up wound evaluation of her right above-knee amputation.  The patient has had multiple instances of debridement.  Following this was due to concern for possible osteomyelitis but it was found that the patient had worsening of her hematoma.  Following her most recent debridement in the wound has some good areas of granulation however she still has some significant slough and some tissue noted near the distal portion.  Also concern for the patient has her left lower extremity.  She has recently noticed that there has been some bruising and a wound on her left foot.  She notes that her left leg has been giving her extensive amounts of pain and worse at night.  She has not been able to sleep during the evening.  On exam her thigh is warm but approaching her leg it becomes cool near the feet and toes.  Patient also has some light mottling.  She has an axillary femoral graft.  She had a lower extremity arterial duplex which showed dampened monophasic flow throughout the left leg there is concern today that the patient has an occlusion of her graft.    Review of Systems  Skin:  Positive for color change and wound.  Psychiatric/Behavioral:  The patient is nervous/anxious.   All other systems reviewed and are negative.      Objective:   Physical Exam Vitals reviewed.  HENT:     Head: Normocephalic.  Cardiovascular:     Rate and Rhythm: Normal rate.  Pulmonary:     Effort: Pulmonary effort is normal.  Musculoskeletal:     Right Lower Extremity: Right leg is amputated below knee.  Skin:    Coloration: Skin is pale.  Neurological:     Mental Status: She is alert and oriented to person, place, and time.  Psychiatric:        Mood and Affect: Affect is  tearful.        Behavior: Behavior normal.        Thought Content: Thought content normal.        Judgment: Judgment normal.       BP 95/63 (BP Location: Right Arm)   Pulse 71   Resp 16   Past Medical History:  Diagnosis Date   Abdominal aortic atherosclerosis ([place])    a. 05/2017 CTA abd/pelvis: significant atherosclerotic dzs of the infrarenal abd Ao w/ some mural thrombus. No aneurysm or dissection.   Acute focal ischemia of small intestine (HCC)    Acute right-sided low back pain with right-sided sciatica 06/13/2017   AKI (acute kidney injury) ([place]) 07/29/2017   Anemia    Anginal pain ([place])    a. 08/2012 Lexiscan MV: EF 54%, non ischemia/infarct.   Anxiety and depression    Baker's cyst of knee, right    a. 07/2016 U/S: 4.1 x 1.4 x 2.9 cystic structure in R poplitetal fossa.   Bell's palsy    CAD (coronary artery disease)    CHF (congestive heart failure) ([place])    a.) TTE 06/20/2017: EF 60-65%, no rwma, G1DD, no source of cardiac emboli.   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 05/2017 CTA Abd/pelvis: apparent thrombus or embolus in prox SMA (70-90%); b. 05/2017 catheter directed tPA, mechanical thrombectomy, and stenting of the SMA.   Essential hypertension with goal blood pressure less than 140/90 01/19/2016   Gastroesophageal reflux disease 01/19/2016   GERD (gastroesophageal reflux disease)    H/O colectomy    History of 2019 novel coronavirus disease (COVID-19) 11/14/2019   History of kidney stones    Hyperlipidemia    Hypertension    Morbid obesity with BMI of 40.0-44.9, adult ([place])    NSTEMI (non-ST elevated myocardial infarction) ([place]) 06/27/2021   a.) high sensitivity troponins trended: 893 --> 1587 --> 1748 --> 868 --> 735 ng/L   Occlusive mesenteric  ischemia ([place]) 06/19/2017   Osteoarthritis    PAD (peripheral artery disease) (HCC)    PAF (paroxysmal atrial fibrillation) (HCC)    a.) CHA2DS2-VASc = 6 (sex, CHF, HTN, DVT x2, aortic plaque). b.) rate/rhythm maintained on oral metoprolol tartrate; chronically anticoagulated with standard dose apixaban.   Palpitations    Peripheral edema    Personal history of other malignant neoplasm of skin 06/01/2016   Pneumonia    Pre-diabetes    Primary osteoarthritis of both knees 06/13/2017   SBO (small bowel obstruction) ([place]) 08/06/2017   Skin cancer of nose    Superior mesenteric artery thrombosis (HCC)    Vitamin D deficiency     Social History   Socioeconomic History   Marital status: Single    Spouse name: Not on file   Number of children: Not on file   Years of education: Not on file   Highest education level: Not on file  Occupational History   Occupation: Unemployed  Tobacco Use   Smoking status: Former    Packs/day: 1    Types: Cigarettes    Quit date: 2023    Years since quitting: 1.2   Smokeless tobacco: Never  Vaping Use   Vaping Use: Never used  Substance and Sexual Activity   Alcohol use: No   Drug use: No   Sexual activity: Not on file  Other Topics Concern   Not on file  Social History Narrative   Lives in [place] with daughter   Social Determinants of Health   Financial Resource Strain: Not on file  Food Insecurity: No Food Insecurity (12/04/2022)   Hunger Vital Sign    Worried About Running Out of Food in the Last Year: Never true    Ran Out of Food in the Last Year: Never true  Transportation Needs: No Transportation Needs (12/04/2022)   PRAPARE - Hydrologist (Medical): No    Lack of Transportation (Non-Medical): No  Physical Activity: Not on file  Stress: Not on file  Social Connections: Not on file  Intimate Partner Violence: Not At Risk (12/04/2022)   Humiliation, Afraid, Rape, and Kick questionnaire    Fear  of Current or Ex-Partner: No    Emotionally Abused: No    Physically Abused: No    Sexually Abused: No    Past Surgical History:  Procedure Laterality Date   AMPUTATION Right 04/06/2022   Procedure: AMPUTATION ABOVE KNEE;  Surgeon: Algernon Huxley, MD;  Location: [facility];  Service: Vascular;  Laterality: Right;   AMPUTATION  12/07/2022   Procedure: AMPUTATION ABOVE KNEE REVISION WITH RESECTION OF FEMUR;  Surgeon: Algernon Huxley, MD;  Location: [facility];  Service: Vascular;;   AMPUTATION TOE Left 12/09/2019   Procedure: AMPUTATION TOE MPJ left;  Surgeon: Caroline More, DPM;  Location: [facility];  Service: Podiatry;  Laterality: Left;   APPENDECTOMY     APPLICATION OF WOUND VAC  12/01/2021   Procedure: APPLICATION OF WOUND VAC;  Surgeon: Algernon Huxley, MD;  Location: [facility];  Service: Vascular;;   APPLICATION OF WOUND VAC Right 05/03/2022   Procedure: APPLICATION OF WOUND VAC;  Surgeon: Algernon Huxley, MD;  Location: [facility];  Service: Vascular;  Laterality: Right;   APPLICATION OF WOUND VAC Left 07/23/2022   Procedure: APPLICATION OF WOUND VAC;  Surgeon: Serafina Mitchell, MD;  Location: [facility];  Service: Vascular;  Laterality: Left;  [place]   APPLICATION OF WOUND VAC Right 08/28/2022   Procedure: APPLICATION OF WOUND VAC;  Surgeon: Algernon Huxley, MD;  Location: [facility];  Service: Vascular;  Laterality: Right;   APPLICATION OF WOUND VAC  12/07/2022   Procedure: APPLICATION OF WOUND VAC;  Surgeon: Algernon Huxley, MD;  Location: [facility];  Service: Vascular;;   AXILLARY-FEMORAL BYPASS GRAFT Left 07/23/2022   Procedure: BYPASS GRAFT AXILLA-BIFEMORAL;  Surgeon: Serafina Mitchell, MD;  Location: [facility];  Service: Vascular;  Laterality: Left;   CHOLECYSTECTOMY     COLON SURGERY     COLONOSCOPY WITH PROPOFOL N/A 03/23/2022   Procedure: COLONOSCOPY WITH PROPOFOL;  Surgeon: Jonathon Bellows, MD;  Location: [facility];  Service: Gastroenterology;  Laterality: N/A;  rectal bleed   EMBOLECTOMY OR THROMBECTOMY,  WITH OR WITHOUT CATHETER; FEMOROPOPLITEAL, AORTOILIAC ARTERY, BY LEG INCISION N/A 06/21/2020   Left - Decomp Fasciotomy Leg; Ant &/Or Lat Compart Only; Midline - Angiography, Visceral, Selective Or Supraselective (With Or Without Flush Aortogram); Left - Revascularize, Endovasc, Open/Percut, Iliac Artery, Unilat, Initial Vessel; W/Translum Stent, W/Angioplasty; Right - Revascularize, Endovasc, Open/Percut, Iliac Artery, Ea Add`L Ipsilateral; W/Translumin Stent, W/Angioplasty; Location [facility];   ENDARTERECTOMY FEMORAL Left 12/01/2021   Procedure: ENDARTERECTOMY FEMORAL;  Surgeon: Algernon Huxley, MD;  Location: [facility];  Service: Vascular;  Laterality: Left;   ENDOVASCULAR REPAIR/STENT GRAFT Left 12/01/2021   Procedure: ENDOVASCULAR REPAIR/STENT GRAFT;  Surgeon: Algernon Huxley, MD;  Location: [place] [facility];  Service: Cardiovascular;  Laterality: Left;   INCISION AND DRAINAGE OF WOUND Right 05/03/2022   Procedure: IRRIGATION AND DEBRIDEMENT RIGHT STUMP;  Surgeon: Algernon Huxley, MD;  Location: [facility];  Service: Vascular;  Laterality: Right;   IRRIGATION AND DEBRIDEMENT ABSCESS Right 08/28/2022   Procedure: AKA IRRIGATION AND DEBRIDEMENT ABSCESS;  Surgeon: Algernon Huxley, MD;  Location: [facility];  Service: Vascular;  Laterality: Right;   LAPAROTOMY N/A 08/08/2017   Procedure: EXPLORATORY LAPAROTOMY POSSIBLE BOWEL RESECTION;  Surgeon: Jules Husbands, MD;  Location: [facility];  Service: General;  Laterality: N/A;   LEFT HEART CATH AND CORONARY ANGIOGRAPHY N/A 05/17/2021   Procedure: LEFT HEART CATH AND CORONARY ANGIOGRAPHY;  Surgeon: Yolonda Kida, MD;  Location: [place] [facility];  Service: Cardiovascular;  Laterality: N/A;   LOWER EXTREMITY ANGIOGRAPHY Left 11/17/2019   Procedure: LOWER EXTREMITY ANGIOGRAPHY;  Surgeon: Algernon Huxley, MD;  Location: [place] [facility];  Service: Cardiovascular;  Laterality: Left;   LOWER EXTREMITY ANGIOGRAPHY Right 12/13/2020   Procedure: LOWER EXTREMITY ANGIOGRAPHY;   Surgeon: Algernon Huxley, MD;  Location: [place] [facility];  Service: Cardiovascular;  Laterality: Right;   LOWER EXTREMITY ANGIOGRAPHY Right 01/10/2021   Procedure: LOWER  EXTREMITY ANGIOGRAPHY;  Surgeon: Algernon Huxley, MD;  Location: [place] [facility];  Service: Cardiovascular;  Laterality: Right;   LOWER EXTREMITY ANGIOGRAPHY Right 01/24/2021   Procedure: LOWER EXTREMITY ANGIOGRAPHY;  Surgeon: Algernon Huxley, MD;  Location: [place] [facility];  Service: Cardiovascular;  Laterality: Right;   LOWER EXTREMITY ANGIOGRAPHY Bilateral 06/28/2021   Procedure: Lower Extremity Angiography;  Surgeon: Algernon Huxley, MD;  Location: [place] [facility];  Service: Cardiovascular;  Laterality: Bilateral;   LOWER EXTREMITY ANGIOGRAPHY Right 11/28/2021   Procedure: LOWER EXTREMITY ANGIOGRAPHY;  Surgeon: Algernon Huxley, MD;  Location: [place] [facility];  Service: Cardiovascular;  Laterality: Right;   LOWER EXTREMITY ANGIOGRAPHY Left 11/30/2021   Procedure: Lower Extremity Angiography;  Surgeon: Algernon Huxley, MD;  Location: [place] [facility];  Service: Cardiovascular;  Laterality: Left;   LOWER EXTREMITY ANGIOGRAPHY Right 02/22/2022   Procedure: Lower Extremity Angiography;  Surgeon: Algernon Huxley, MD;  Location: [place] [facility];  Service: Cardiovascular;  Laterality: Right;   LOWER EXTREMITY ANGIOGRAPHY Right 03/13/2022   Procedure: Lower Extremity Angiography;  Surgeon: Algernon Huxley, MD;  Location: [place] [facility];  Service: Cardiovascular;  Laterality: Right;   LOWER EXTREMITY ANGIOGRAPHY Right 03/24/2022   Procedure: Lower Extremity Angiography;  Surgeon: Algernon Huxley, MD;  Location: [place] [facility];  Service: Cardiovascular;  Laterality: Right;   LOWER EXTREMITY INTERVENTION N/A 11/19/2019   Procedure: LOWER EXTREMITY INTERVENTION;  Surgeon: Algernon Huxley, MD;  Location: [place] [facility];  Service: Cardiovascular;  Laterality: N/A;   LOWER EXTREMITY INTERVENTION Bilateral 06/29/2021   Procedure:  LOWER EXTREMITY INTERVENTION;  Surgeon: Algernon Huxley, MD;  Location: [place] [facility];  Service: Cardiovascular;  Laterality: Bilateral;   TEE WITHOUT CARDIOVERSION N/A 06/22/2017   Procedure: TRANSESOPHAGEAL ECHOCARDIOGRAM (TEE);  Surgeon: Wellington Hampshire, MD;  Location: [facility];  Service: Cardiovascular;  Laterality: N/A;   TOTAL KNEE ARTHROPLASTY Right 07/11/2018   Procedure: TOTAL KNEE ARTHROPLASTY;  Surgeon: Corky Mull, MD;  Location: [facility];  Service: Orthopedics;  Laterality: Right;   VAGINAL HYSTERECTOMY     VISCERAL ARTERY INTERVENTION N/A 06/20/2017   Procedure: Visceral Artery Intervention, possible aortic thrombectomy;  Surgeon: Algernon Huxley, MD;  Location: [place] [facility];  Service: Cardiovascular;  Laterality: N/A;   VISCERAL ARTERY INTERVENTION N/A 01/28/2018   Procedure: VISCERAL ARTERY INTERVENTION;  Surgeon: Algernon Huxley, MD;  Location: [place] [facility];  Service: Cardiovascular;  Laterality: N/A;    Family History  Problem Relation Age of Onset   Hypertension Mother    Heart disease Mother    Heart attack Mother    Breast cancer Mother    Heart disease Father    Hypertension Father    Breast cancer Paternal Aunt     Allergies  Allergen Reactions   Gabapentin     Tremors    Bactrim [Sulfamethoxazole-Trimethoprim] Itching       Latest Ref Rng & Units 01/04/2023   12:16 PM 12/11/2022    3:55 AM 12/10/2022    4:17 AM  CBC  WBC 4.0 - 10.5 K/uL 5.8  7.4  6.9   Hemoglobin 12.0 - 15.0 g/dL 12.2  10.6  11.4   Hematocrit 36.0 - 46.0 % 39.5  33.9  36.7   Platelets 150 - 400 K/uL 223  236  218       CMP     Component Value Date/Time   NA 139 01/04/2023 1216  NA 140 02/12/2015 1137   K 4.4 01/04/2023 1216   K 2.9 (L) 02/12/2015 1137   CL 103 01/04/2023 1216   CL 107 02/12/2015 1137   CO2 26 01/04/2023 1216   CO2 27 02/12/2015 1137   GLUCOSE 103 (H) 01/04/2023 1216   GLUCOSE 120 (H) 02/12/2015 1137   BUN 18 01/04/2023 1216   BUN 15  02/12/2015 1137   CREATININE 0.60 01/04/2023 1216   CREATININE 0.78 02/12/2015 1137   CALCIUM 9.1 01/04/2023 1216   CALCIUM 9.0 02/12/2015 1137   PROT 7.4 01/04/2023 1216   PROT 7.0 09/14/2020 1139   PROT 7.1 01/19/2015 1112   ALBUMIN 3.3 (L) 01/04/2023 1216   ALBUMIN 3.9 09/14/2020 1139   ALBUMIN 3.8 01/19/2015 1112   AST 16 01/04/2023 1216   AST 17 01/19/2015 1112   ALT 16 01/04/2023 1216   ALT 11 (L) 01/19/2015 1112   ALKPHOS 123 01/04/2023 1216   ALKPHOS 84 01/19/2015 1112   BILITOT 0.4 01/04/2023 1216   BILITOT <0.2 09/14/2020 1139   BILITOT 0.5 01/19/2015 1112   GFRNONAA >60 01/04/2023 1216   GFRNONAA >60 02/12/2015 1137   GFRAA >60 11/23/2019 1330   GFRAA >60 02/12/2015 1137     No results found.     Assessment & Plan:   1. Critical limb ischemia of left lower extremity (HCC)  Recommend:  The patient has evidence of severe atherosclerotic changes of both lower extremities associated with ulceration and tissue loss of the left foot.  This represents a limb threatening ischemia and places the patient at the risk for left limb loss.  Patient should undergo angiography of the left lower extremity with the hope for intervention for limb salvage.  The patient will require a radial or brachial approach due to her axillary graft.  The risks and benefits as well as the alternative therapies was discussed in detail with the patient.  All questions were answered.  Patient agrees to proceed with left lower extremity  angiography.  The patient will follow up with me in the office after the procedure.   2. S/P AKA (above knee amputation) unilateral, right ([place]) The patient has a wound of her right above-knee amputation postdebridement.  There are some areas with good granulation however she still has some significant slough and tissue near the distal portion.  Will continue with wound VAC changes however if we are not seeing increased improvement and decrease in size we may need to  consider additional debridement.   Current Outpatient Medications on File Prior to Visit  Medication Sig Dispense Refill   alum & mag hydroxide-simeth (MAALOX/MYLANTA) 200-200-20 MG/5ML suspension Take 15-30 mLs by mouth every 2 (two) hours as needed for indigestion. 355 mL 0   apixaban (ELIQUIS) 5 MG TABS tablet Take 1 tablet (5 mg total) by mouth 2 (two) times daily. 60 tablet 11   aspirin EC 81 MG tablet Take 81 mg by mouth daily. Swallow whole.     atorvastatin (LIPITOR) 80 MG tablet Take 80 mg by mouth at bedtime.     bisacodyl (DULCOLAX) 5 MG EC tablet Take 1 tablet (5 mg total) by mouth daily as needed for moderate constipation. 30 tablet 0   COMBIVENT RESPIMAT 20-100 MCG/ACT AERS respimat Inhale 1 puff into the lungs every 6 (six) hours as needed.     esomeprazole (NEXIUM) 20 MG capsule Take 20 mg by mouth daily.     feeding supplement (ENSURE ENLIVE / ENSURE PLUS) LIQD Take 237 mLs by  mouth 2 (two) times daily between meals. 237 mL 12   ferrous sulfate 325 (65 FE) MG tablet Take 325 mg by mouth daily.     fluconazole (DIFLUCAN) 150 MG tablet Take 150 mg by mouth every 3 (three) days.     isosorbide mononitrate (IMDUR) 60 MG 24 hr tablet Take 60 mg by mouth daily.     lidocaine (LIDODERM) 5 % Place 1 patch onto the skin daily. Remove & Discard patch within 12 hours or as directed by MD 30 patch 0   metoprolol tartrate (LOPRESSOR) 25 MG tablet Take 0.5 tablets (12.5 mg total) by mouth 2 (two) times daily. Hold if SBP is 100 or less or HR is less than 60. Notify Provider (Patient taking differently: Take 50 mg by mouth 2 (two) times daily. Hold if SBP is 100 or less or HR is less than 60. Notify Provider)     metroNIDAZOLE (FLAGYL) 500 MG tablet Take 1 tablet (500 mg total) by mouth 2 (two) times daily. 28 tablet 0   Omadacycline Tosylate 150 MG TABS Take 2 tablets (300 mg total) by mouth daily. 28 tablet 0   oxyCODONE-acetaminophen (PERCOCET/ROXICET) 5-325 MG tablet Take 1-2 tablets by  mouth every 4 (four) hours as needed for severe pain. 24 tablet 0   polyethylene glycol (MIRALAX / GLYCOLAX) 17 g packet Take 17 g by mouth daily. 14 each 0   pregabalin (LYRICA) 200 MG capsule Take 1 capsule (200 mg total) by mouth in the morning, at noon, and at bedtime. 90 capsule 3   citalopram (CELEXA) 40 MG tablet Take 1 tablet (40 mg total) by mouth daily. 30 tablet 2   No current facility-administered medications on file prior to visit.    There are no Patient Instructions on file for this visit. No follow-ups on file.   Kris Hartmann, NP

## 2023-01-12 NOTE — H&P (View-Only) (Signed)
Subjective:    Patient ID: Carla [name], female    DOB: Aug 15, 1961, 62 y.o.   MRN: [id number] Chief Complaint  Patient presents with   Follow-up    [facility] 3 week wound check    Carla [name] is a 62 year old female who returns today for follow-up wound evaluation of her right above-knee amputation.  The patient has had multiple instances of debridement.  Following this was due to concern for possible osteomyelitis but it was found that the patient had worsening of her hematoma.  Following her most recent debridement in the wound has some good areas of granulation however she still has some significant slough and some tissue noted near the distal portion.  Also concern for the patient has her left lower extremity.  She has recently noticed that there has been some bruising and a wound on her left foot.  She notes that her left leg has been giving her extensive amounts of pain and worse at night.  She has not been able to sleep during the evening.  On exam her thigh is warm but approaching her leg it becomes cool near the feet and toes.  Patient also has some light mottling.  She has an axillary femoral graft.  She had a lower extremity arterial duplex which showed dampened monophasic flow throughout the left leg there is concern today that the patient has an occlusion of her graft.    Review of Systems  Skin:  Positive for color change and wound.  Psychiatric/Behavioral:  The patient is nervous/anxious.   All other systems reviewed and are negative.      Objective:   Physical Exam Vitals reviewed.  HENT:     Head: Normocephalic.  Cardiovascular:     Rate and Rhythm: Normal rate.  Pulmonary:     Effort: Pulmonary effort is normal.  Musculoskeletal:     Right Lower Extremity: Right leg is amputated below knee.  Skin:    Coloration: Skin is pale.  Neurological:     Mental Status: She is alert and oriented to person, place, and time.  Psychiatric:        Mood and Affect: Affect is  tearful.        Behavior: Behavior normal.        Thought Content: Thought content normal.        Judgment: Judgment normal.       BP 95/63 (BP Location: Right Arm)   Pulse 71   Resp 16   Past Medical History:  Diagnosis Date   Abdominal aortic atherosclerosis ([place])    a. 05/2017 CTA abd/pelvis: significant atherosclerotic dzs of the infrarenal abd Ao w/ some mural thrombus. No aneurysm or dissection.   Acute focal ischemia of small intestine (HCC)    Acute right-sided low back pain with right-sided sciatica 06/13/2017   AKI (acute kidney injury) ([place]) 07/29/2017   Anemia    Anginal pain ([place])    a. 08/2012 Lexiscan MV: EF 54%, non ischemia/infarct.   Anxiety and depression    Baker's cyst of knee, right    a. 07/2016 U/S: 4.1 x 1.4 x 2.9 cystic structure in R poplitetal fossa.   Bell's palsy    CAD (coronary artery disease)    CHF (congestive heart failure) ([place])    a.) TTE 06/20/2017: EF 60-65%, no rwma, G1DD, no source of cardiac emboli.   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 05/2017 CTA Abd/pelvis: apparent thrombus or embolus in prox SMA (70-90%); b. 05/2017 catheter directed tPA, mechanical thrombectomy, and stenting of the SMA.   Essential hypertension with goal blood pressure less than 140/90 01/19/2016   Gastroesophageal reflux disease 01/19/2016   GERD (gastroesophageal reflux disease)    H/O colectomy    History of 2019 novel coronavirus disease (COVID-19) 11/14/2019   History of kidney stones    Hyperlipidemia    Hypertension    Morbid obesity with BMI of 40.0-44.9, adult ([place])    NSTEMI (non-ST elevated myocardial infarction) ([place]) 06/27/2021   a.) high sensitivity troponins trended: 893 --> 1587 --> 1748 --> 868 --> 735 ng/L   Occlusive mesenteric  ischemia ([place]) 06/19/2017   Osteoarthritis    PAD (peripheral artery disease) (HCC)    PAF (paroxysmal atrial fibrillation) (HCC)    a.) CHA2DS2-VASc = 6 (sex, CHF, HTN, DVT x2, aortic plaque). b.) rate/rhythm maintained on oral metoprolol tartrate; chronically anticoagulated with standard dose apixaban.   Palpitations    Peripheral edema    Personal history of other malignant neoplasm of skin 06/01/2016   Pneumonia    Pre-diabetes    Primary osteoarthritis of both knees 06/13/2017   SBO (small bowel obstruction) ([place]) 08/06/2017   Skin cancer of nose    Superior mesenteric artery thrombosis (HCC)    Vitamin D deficiency     Social History   Socioeconomic History   Marital status: Single    Spouse name: Not on file   Number of children: Not on file   Years of education: Not on file   Highest education level: Not on file  Occupational History   Occupation: Unemployed  Tobacco Use   Smoking status: Former    Packs/day: 1    Types: Cigarettes    Quit date: 2023    Years since quitting: 1.2   Smokeless tobacco: Never  Vaping Use   Vaping Use: Never used  Substance and Sexual Activity   Alcohol use: No   Drug use: No   Sexual activity: Not on file  Other Topics Concern   Not on file  Social History Narrative   Lives in [place] with daughter   Social Determinants of Health   Financial Resource Strain: Not on file  Food Insecurity: No Food Insecurity (12/04/2022)   Hunger Vital Sign    Worried About Running Out of Food in the Last Year: Never true    Ran Out of Food in the Last Year: Never true  Transportation Needs: No Transportation Needs (12/04/2022)   PRAPARE - Hydrologist (Medical): No    Lack of Transportation (Non-Medical): No  Physical Activity: Not on file  Stress: Not on file  Social Connections: Not on file  Intimate Partner Violence: Not At Risk (12/04/2022)   Humiliation, Afraid, Rape, and Kick questionnaire    Fear  of Current or Ex-Partner: No    Emotionally Abused: No    Physically Abused: No    Sexually Abused: No    Past Surgical History:  Procedure Laterality Date   AMPUTATION Right 04/06/2022   Procedure: AMPUTATION ABOVE KNEE;  Surgeon: Algernon Huxley, MD;  Location: [facility];  Service: Vascular;  Laterality: Right;   AMPUTATION  12/07/2022   Procedure: AMPUTATION ABOVE KNEE REVISION WITH RESECTION OF FEMUR;  Surgeon: Algernon Huxley, MD;  Location: [facility];  Service: Vascular;;   AMPUTATION TOE Left 12/09/2019   Procedure: AMPUTATION TOE MPJ left;  Surgeon: Caroline More, DPM;  Location: [facility];  Service: Podiatry;  Laterality: Left;   APPENDECTOMY     APPLICATION OF WOUND VAC  12/01/2021   Procedure: APPLICATION OF WOUND VAC;  Surgeon: Algernon Huxley, MD;  Location: [facility];  Service: Vascular;;   APPLICATION OF WOUND VAC Right 05/03/2022   Procedure: APPLICATION OF WOUND VAC;  Surgeon: Algernon Huxley, MD;  Location: [facility];  Service: Vascular;  Laterality: Right;   APPLICATION OF WOUND VAC Left 07/23/2022   Procedure: APPLICATION OF WOUND VAC;  Surgeon: Serafina Mitchell, MD;  Location: [facility];  Service: Vascular;  Laterality: Left;  [place]   APPLICATION OF WOUND VAC Right 08/28/2022   Procedure: APPLICATION OF WOUND VAC;  Surgeon: Algernon Huxley, MD;  Location: [facility];  Service: Vascular;  Laterality: Right;   APPLICATION OF WOUND VAC  12/07/2022   Procedure: APPLICATION OF WOUND VAC;  Surgeon: Algernon Huxley, MD;  Location: [facility];  Service: Vascular;;   AXILLARY-FEMORAL BYPASS GRAFT Left 07/23/2022   Procedure: BYPASS GRAFT AXILLA-BIFEMORAL;  Surgeon: Serafina Mitchell, MD;  Location: [facility];  Service: Vascular;  Laterality: Left;   CHOLECYSTECTOMY     COLON SURGERY     COLONOSCOPY WITH PROPOFOL N/A 03/23/2022   Procedure: COLONOSCOPY WITH PROPOFOL;  Surgeon: Jonathon Bellows, MD;  Location: [facility];  Service: Gastroenterology;  Laterality: N/A;  rectal bleed   EMBOLECTOMY OR THROMBECTOMY,  WITH OR WITHOUT CATHETER; FEMOROPOPLITEAL, AORTOILIAC ARTERY, BY LEG INCISION N/A 06/21/2020   Left - Decomp Fasciotomy Leg; Ant &/Or Lat Compart Only; Midline - Angiography, Visceral, Selective Or Supraselective (With Or Without Flush Aortogram); Left - Revascularize, Endovasc, Open/Percut, Iliac Artery, Unilat, Initial Vessel; W/Translum Stent, W/Angioplasty; Right - Revascularize, Endovasc, Open/Percut, Iliac Artery, Ea Add`L Ipsilateral; W/Translumin Stent, W/Angioplasty; Location [facility];   ENDARTERECTOMY FEMORAL Left 12/01/2021   Procedure: ENDARTERECTOMY FEMORAL;  Surgeon: Algernon Huxley, MD;  Location: [facility];  Service: Vascular;  Laterality: Left;   ENDOVASCULAR REPAIR/STENT GRAFT Left 12/01/2021   Procedure: ENDOVASCULAR REPAIR/STENT GRAFT;  Surgeon: Algernon Huxley, MD;  Location: [place] [facility];  Service: Cardiovascular;  Laterality: Left;   INCISION AND DRAINAGE OF WOUND Right 05/03/2022   Procedure: IRRIGATION AND DEBRIDEMENT RIGHT STUMP;  Surgeon: Algernon Huxley, MD;  Location: [facility];  Service: Vascular;  Laterality: Right;   IRRIGATION AND DEBRIDEMENT ABSCESS Right 08/28/2022   Procedure: AKA IRRIGATION AND DEBRIDEMENT ABSCESS;  Surgeon: Algernon Huxley, MD;  Location: [facility];  Service: Vascular;  Laterality: Right;   LAPAROTOMY N/A 08/08/2017   Procedure: EXPLORATORY LAPAROTOMY POSSIBLE BOWEL RESECTION;  Surgeon: Jules Husbands, MD;  Location: [facility];  Service: General;  Laterality: N/A;   LEFT HEART CATH AND CORONARY ANGIOGRAPHY N/A 05/17/2021   Procedure: LEFT HEART CATH AND CORONARY ANGIOGRAPHY;  Surgeon: Yolonda Kida, MD;  Location: [place] [facility];  Service: Cardiovascular;  Laterality: N/A;   LOWER EXTREMITY ANGIOGRAPHY Left 11/17/2019   Procedure: LOWER EXTREMITY ANGIOGRAPHY;  Surgeon: Algernon Huxley, MD;  Location: [place] [facility];  Service: Cardiovascular;  Laterality: Left;   LOWER EXTREMITY ANGIOGRAPHY Right 12/13/2020   Procedure: LOWER EXTREMITY ANGIOGRAPHY;   Surgeon: Algernon Huxley, MD;  Location: [place] [facility];  Service: Cardiovascular;  Laterality: Right;   LOWER EXTREMITY ANGIOGRAPHY Right 01/10/2021   Procedure: LOWER  EXTREMITY ANGIOGRAPHY;  Surgeon: Algernon Huxley, MD;  Location: [place] [facility];  Service: Cardiovascular;  Laterality: Right;   LOWER EXTREMITY ANGIOGRAPHY Right 01/24/2021   Procedure: LOWER EXTREMITY ANGIOGRAPHY;  Surgeon: Algernon Huxley, MD;  Location: [place] [facility];  Service: Cardiovascular;  Laterality: Right;   LOWER EXTREMITY ANGIOGRAPHY Bilateral 06/28/2021   Procedure: Lower Extremity Angiography;  Surgeon: Algernon Huxley, MD;  Location: [place] [facility];  Service: Cardiovascular;  Laterality: Bilateral;   LOWER EXTREMITY ANGIOGRAPHY Right 11/28/2021   Procedure: LOWER EXTREMITY ANGIOGRAPHY;  Surgeon: Algernon Huxley, MD;  Location: [place] [facility];  Service: Cardiovascular;  Laterality: Right;   LOWER EXTREMITY ANGIOGRAPHY Left 11/30/2021   Procedure: Lower Extremity Angiography;  Surgeon: Algernon Huxley, MD;  Location: [place] [facility];  Service: Cardiovascular;  Laterality: Left;   LOWER EXTREMITY ANGIOGRAPHY Right 02/22/2022   Procedure: Lower Extremity Angiography;  Surgeon: Algernon Huxley, MD;  Location: [place] [facility];  Service: Cardiovascular;  Laterality: Right;   LOWER EXTREMITY ANGIOGRAPHY Right 03/13/2022   Procedure: Lower Extremity Angiography;  Surgeon: Algernon Huxley, MD;  Location: [place] [facility];  Service: Cardiovascular;  Laterality: Right;   LOWER EXTREMITY ANGIOGRAPHY Right 03/24/2022   Procedure: Lower Extremity Angiography;  Surgeon: Algernon Huxley, MD;  Location: [place] [facility];  Service: Cardiovascular;  Laterality: Right;   LOWER EXTREMITY INTERVENTION N/A 11/19/2019   Procedure: LOWER EXTREMITY INTERVENTION;  Surgeon: Algernon Huxley, MD;  Location: [place] [facility];  Service: Cardiovascular;  Laterality: N/A;   LOWER EXTREMITY INTERVENTION Bilateral 06/29/2021   Procedure:  LOWER EXTREMITY INTERVENTION;  Surgeon: Algernon Huxley, MD;  Location: [place] [facility];  Service: Cardiovascular;  Laterality: Bilateral;   TEE WITHOUT CARDIOVERSION N/A 06/22/2017   Procedure: TRANSESOPHAGEAL ECHOCARDIOGRAM (TEE);  Surgeon: Wellington Hampshire, MD;  Location: [facility];  Service: Cardiovascular;  Laterality: N/A;   TOTAL KNEE ARTHROPLASTY Right 07/11/2018   Procedure: TOTAL KNEE ARTHROPLASTY;  Surgeon: Corky Mull, MD;  Location: [facility];  Service: Orthopedics;  Laterality: Right;   VAGINAL HYSTERECTOMY     VISCERAL ARTERY INTERVENTION N/A 06/20/2017   Procedure: Visceral Artery Intervention, possible aortic thrombectomy;  Surgeon: Algernon Huxley, MD;  Location: [place] [facility];  Service: Cardiovascular;  Laterality: N/A;   VISCERAL ARTERY INTERVENTION N/A 01/28/2018   Procedure: VISCERAL ARTERY INTERVENTION;  Surgeon: Algernon Huxley, MD;  Location: [place] [facility];  Service: Cardiovascular;  Laterality: N/A;    Family History  Problem Relation Age of Onset   Hypertension Mother    Heart disease Mother    Heart attack Mother    Breast cancer Mother    Heart disease Father    Hypertension Father    Breast cancer Paternal Aunt     Allergies  Allergen Reactions   Gabapentin     Tremors    Bactrim [Sulfamethoxazole-Trimethoprim] Itching       Latest Ref Rng & Units 01/04/2023   12:16 PM 12/11/2022    3:55 AM 12/10/2022    4:17 AM  CBC  WBC 4.0 - 10.5 K/uL 5.8  7.4  6.9   Hemoglobin 12.0 - 15.0 g/dL 12.2  10.6  11.4   Hematocrit 36.0 - 46.0 % 39.5  33.9  36.7   Platelets 150 - 400 K/uL 223  236  218       CMP     Component Value Date/Time   NA 139 01/04/2023 1216  NA 140 02/12/2015 1137   K 4.4 01/04/2023 1216   K 2.9 (L) 02/12/2015 1137   CL 103 01/04/2023 1216   CL 107 02/12/2015 1137   CO2 26 01/04/2023 1216   CO2 27 02/12/2015 1137   GLUCOSE 103 (H) 01/04/2023 1216   GLUCOSE 120 (H) 02/12/2015 1137   BUN 18 01/04/2023 1216   BUN 15  02/12/2015 1137   CREATININE 0.60 01/04/2023 1216   CREATININE 0.78 02/12/2015 1137   CALCIUM 9.1 01/04/2023 1216   CALCIUM 9.0 02/12/2015 1137   PROT 7.4 01/04/2023 1216   PROT 7.0 09/14/2020 1139   PROT 7.1 01/19/2015 1112   ALBUMIN 3.3 (L) 01/04/2023 1216   ALBUMIN 3.9 09/14/2020 1139   ALBUMIN 3.8 01/19/2015 1112   AST 16 01/04/2023 1216   AST 17 01/19/2015 1112   ALT 16 01/04/2023 1216   ALT 11 (L) 01/19/2015 1112   ALKPHOS 123 01/04/2023 1216   ALKPHOS 84 01/19/2015 1112   BILITOT 0.4 01/04/2023 1216   BILITOT <0.2 09/14/2020 1139   BILITOT 0.5 01/19/2015 1112   GFRNONAA >60 01/04/2023 1216   GFRNONAA >60 02/12/2015 1137   GFRAA >60 11/23/2019 1330   GFRAA >60 02/12/2015 1137     No results found.     Assessment & Plan:   1. Critical limb ischemia of left lower extremity (HCC)  Recommend:  The patient has evidence of severe atherosclerotic changes of both lower extremities associated with ulceration and tissue loss of the left foot.  This represents a limb threatening ischemia and places the patient at the risk for left limb loss.  Patient should undergo angiography of the left lower extremity with the hope for intervention for limb salvage.  The patient will require a radial or brachial approach due to her axillary graft.  The risks and benefits as well as the alternative therapies was discussed in detail with the patient.  All questions were answered.  Patient agrees to proceed with left lower extremity  angiography.  The patient will follow up with me in the office after the procedure.   2. S/P AKA (above knee amputation) unilateral, right ([place]) The patient has a wound of her right above-knee amputation postdebridement.  There are some areas with good granulation however she still has some significant slough and tissue near the distal portion.  Will continue with wound VAC changes however if we are not seeing increased improvement and decrease in size we may need to  consider additional debridement.   Current Outpatient Medications on File Prior to Visit  Medication Sig Dispense Refill   alum & mag hydroxide-simeth (MAALOX/MYLANTA) 200-200-20 MG/5ML suspension Take 15-30 mLs by mouth every 2 (two) hours as needed for indigestion. 355 mL 0   apixaban (ELIQUIS) 5 MG TABS tablet Take 1 tablet (5 mg total) by mouth 2 (two) times daily. 60 tablet 11   aspirin EC 81 MG tablet Take 81 mg by mouth daily. Swallow whole.     atorvastatin (LIPITOR) 80 MG tablet Take 80 mg by mouth at bedtime.     bisacodyl (DULCOLAX) 5 MG EC tablet Take 1 tablet (5 mg total) by mouth daily as needed for moderate constipation. 30 tablet 0   COMBIVENT RESPIMAT 20-100 MCG/ACT AERS respimat Inhale 1 puff into the lungs every 6 (six) hours as needed.     esomeprazole (NEXIUM) 20 MG capsule Take 20 mg by mouth daily.     feeding supplement (ENSURE ENLIVE / ENSURE PLUS) LIQD Take 237 mLs by  mouth 2 (two) times daily between meals. 237 mL 12   ferrous sulfate 325 (65 FE) MG tablet Take 325 mg by mouth daily.     fluconazole (DIFLUCAN) 150 MG tablet Take 150 mg by mouth every 3 (three) days.     isosorbide mononitrate (IMDUR) 60 MG 24 hr tablet Take 60 mg by mouth daily.     lidocaine (LIDODERM) 5 % Place 1 patch onto the skin daily. Remove & Discard patch within 12 hours or as directed by MD 30 patch 0   metoprolol tartrate (LOPRESSOR) 25 MG tablet Take 0.5 tablets (12.5 mg total) by mouth 2 (two) times daily. Hold if SBP is 100 or less or HR is less than 60. Notify Provider (Patient taking differently: Take 50 mg by mouth 2 (two) times daily. Hold if SBP is 100 or less or HR is less than 60. Notify Provider)     metroNIDAZOLE (FLAGYL) 500 MG tablet Take 1 tablet (500 mg total) by mouth 2 (two) times daily. 28 tablet 0   Omadacycline Tosylate 150 MG TABS Take 2 tablets (300 mg total) by mouth daily. 28 tablet 0   oxyCODONE-acetaminophen (PERCOCET/ROXICET) 5-325 MG tablet Take 1-2 tablets by  mouth every 4 (four) hours as needed for severe pain. 24 tablet 0   polyethylene glycol (MIRALAX / GLYCOLAX) 17 g packet Take 17 g by mouth daily. 14 each 0   pregabalin (LYRICA) 200 MG capsule Take 1 capsule (200 mg total) by mouth in the morning, at noon, and at bedtime. 90 capsule 3   citalopram (CELEXA) 40 MG tablet Take 1 tablet (40 mg total) by mouth daily. 30 tablet 2   No current facility-administered medications on file prior to visit.    There are no Patient Instructions on file for this visit. No follow-ups on file.   Kris Hartmann, NP

## 2023-01-15 ENCOUNTER — Telehealth (INDEPENDENT_AMBULATORY_CARE_PROVIDER_SITE_OTHER): Payer: Self-pay

## 2023-01-15 ENCOUNTER — Other Ambulatory Visit (HOSPITAL_COMMUNITY): Payer: Self-pay

## 2023-01-15 ENCOUNTER — Other Ambulatory Visit: Payer: Self-pay

## 2023-01-15 NOTE — Telephone Encounter (Signed)
Cindy from [facility] left a message verbal orders for 1 time for 1 week and 1 time for 2 weeks. I left a message on [place] voicemail that orders were fine.

## 2023-01-15 NOTE — Telephone Encounter (Signed)
I attempted to contact the patient's daughter and left a voicemail for a return call. I called the home number and got the patient. Spoke with the patient and she is scheduled with Dr. Lucky Cowboy on on 01/17/23 with a 11:30 am arrival to the [facility]. Pre-procedure instructions were discussed and patient stated she understood. I did advise that she have her daughter to reach out to me if some clarification was needed.

## 2023-01-16 ENCOUNTER — Telehealth (INDEPENDENT_AMBULATORY_CARE_PROVIDER_SITE_OTHER): Payer: Self-pay

## 2023-01-16 ENCOUNTER — Other Ambulatory Visit: Payer: Self-pay

## 2023-01-16 NOTE — Telephone Encounter (Signed)
Ester from [facility] left a message that patient requested visits to be postpone until after procedure is done.

## 2023-01-17 ENCOUNTER — Encounter: Payer: Self-pay | Admitting: Vascular Surgery

## 2023-01-17 ENCOUNTER — Other Ambulatory Visit: Payer: Self-pay

## 2023-01-17 ENCOUNTER — Inpatient Hospital Stay
Admission: AD | Admit: 2023-01-17 | Discharge: 2023-01-19 | DRG: 271 | Disposition: A | Discharge status: Home Health | Payer: 59 | Attending: Vascular Surgery | Admitting: Vascular Surgery

## 2023-01-17 ENCOUNTER — Encounter
Admission: AD | Disposition: A | Discharge status: Home Health | Payer: Self-pay | Source: Home / Self Care | Attending: Vascular Surgery

## 2023-01-17 DIAGNOSIS — F419 Anxiety disorder, unspecified: Secondary | ICD-10-CM | POA: Diagnosis present

## 2023-01-17 DIAGNOSIS — L97529 Non-pressure chronic ulcer of other part of left foot with unspecified severity: Secondary | ICD-10-CM | POA: Diagnosis present

## 2023-01-17 DIAGNOSIS — B9620 Unspecified Escherichia coli [E. coli] as the cause of diseases classified elsewhere: Secondary | ICD-10-CM | POA: Diagnosis present

## 2023-01-17 DIAGNOSIS — Z96651 Presence of right artificial knee joint: Secondary | ICD-10-CM | POA: Diagnosis present

## 2023-01-17 DIAGNOSIS — G8929 Other chronic pain: Secondary | ICD-10-CM | POA: Diagnosis present

## 2023-01-17 DIAGNOSIS — L97909 Non-pressure chronic ulcer of unspecified part of unspecified lower leg with unspecified severity: Secondary | ICD-10-CM

## 2023-01-17 DIAGNOSIS — I70222 Atherosclerosis of native arteries of extremities with rest pain, left leg: Secondary | ICD-10-CM

## 2023-01-17 DIAGNOSIS — Y835 Amputation of limb(s) as the cause of abnormal reaction of the patient, or of later complication, without mention of misadventure at the time of the procedure: Secondary | ICD-10-CM | POA: Diagnosis present

## 2023-01-17 DIAGNOSIS — Z803 Family history of malignant neoplasm of breast: Secondary | ICD-10-CM

## 2023-01-17 DIAGNOSIS — F32A Depression, unspecified: Secondary | ICD-10-CM | POA: Diagnosis present

## 2023-01-17 DIAGNOSIS — I5032 Chronic diastolic (congestive) heart failure: Secondary | ICD-10-CM | POA: Diagnosis present

## 2023-01-17 DIAGNOSIS — I110 Hypertensive heart disease with heart failure: Secondary | ICD-10-CM | POA: Diagnosis present

## 2023-01-17 DIAGNOSIS — I252 Old myocardial infarction: Secondary | ICD-10-CM

## 2023-01-17 DIAGNOSIS — I998 Other disorder of circulatory system: Secondary | ICD-10-CM

## 2023-01-17 DIAGNOSIS — Z7901 Long term (current) use of anticoagulants: Secondary | ICD-10-CM

## 2023-01-17 DIAGNOSIS — Y832 Surgical operation with anastomosis, bypass or graft as the cause of abnormal reaction of the patient, or of later complication, without mention of misadventure at the time of the procedure: Secondary | ICD-10-CM | POA: Diagnosis present

## 2023-01-17 DIAGNOSIS — I70245 Atherosclerosis of native arteries of left leg with ulceration of other part of foot: Secondary | ICD-10-CM | POA: Diagnosis present

## 2023-01-17 DIAGNOSIS — T8743 Infection of amputation stump, right lower extremity: Secondary | ICD-10-CM | POA: Diagnosis present

## 2023-01-17 DIAGNOSIS — Z7951 Long term (current) use of inhaled steroids: Secondary | ICD-10-CM

## 2023-01-17 DIAGNOSIS — Z7189 Other specified counseling: Secondary | ICD-10-CM | POA: Diagnosis not present

## 2023-01-17 DIAGNOSIS — Z8249 Family history of ischemic heart disease and other diseases of the circulatory system: Secondary | ICD-10-CM

## 2023-01-17 DIAGNOSIS — I480 Paroxysmal atrial fibrillation: Secondary | ICD-10-CM | POA: Diagnosis present

## 2023-01-17 DIAGNOSIS — Z560 Unemployment, unspecified: Secondary | ICD-10-CM

## 2023-01-17 DIAGNOSIS — Z9889 Other specified postprocedural states: Secondary | ICD-10-CM | POA: Diagnosis not present

## 2023-01-17 DIAGNOSIS — Z9049 Acquired absence of other specified parts of digestive tract: Secondary | ICD-10-CM

## 2023-01-17 DIAGNOSIS — I2510 Atherosclerotic heart disease of native coronary artery without angina pectoris: Secondary | ICD-10-CM | POA: Diagnosis present

## 2023-01-17 DIAGNOSIS — T82868A Thrombosis of vascular prosthetic devices, implants and grafts, initial encounter: Principal | ICD-10-CM

## 2023-01-17 DIAGNOSIS — Z85828 Personal history of other malignant neoplasm of skin: Secondary | ICD-10-CM | POA: Diagnosis not present

## 2023-01-17 DIAGNOSIS — B9562 Methicillin resistant Staphylococcus aureus infection as the cause of diseases classified elsewhere: Secondary | ICD-10-CM | POA: Diagnosis present

## 2023-01-17 DIAGNOSIS — I743 Embolism and thrombosis of arteries of the lower extremities: Secondary | ICD-10-CM | POA: Diagnosis present

## 2023-01-17 DIAGNOSIS — Z87891 Personal history of nicotine dependence: Secondary | ICD-10-CM

## 2023-01-17 DIAGNOSIS — Z515 Encounter for palliative care: Secondary | ICD-10-CM

## 2023-01-17 DIAGNOSIS — E785 Hyperlipidemia, unspecified: Secondary | ICD-10-CM | POA: Diagnosis present

## 2023-01-17 DIAGNOSIS — D649 Anemia, unspecified: Secondary | ICD-10-CM | POA: Diagnosis present

## 2023-01-17 DIAGNOSIS — Z8616 Personal history of COVID-19: Secondary | ICD-10-CM

## 2023-01-17 DIAGNOSIS — I959 Hypotension, unspecified: Secondary | ICD-10-CM | POA: Diagnosis not present

## 2023-01-17 DIAGNOSIS — Z91199 Patient's noncompliance with other medical treatment and regimen due to unspecified reason: Secondary | ICD-10-CM

## 2023-01-17 DIAGNOSIS — Z887 Allergy status to serum and vaccine status: Secondary | ICD-10-CM

## 2023-01-17 DIAGNOSIS — J449 Chronic obstructive pulmonary disease, unspecified: Secondary | ICD-10-CM | POA: Diagnosis present

## 2023-01-17 DIAGNOSIS — I708 Atherosclerosis of other arteries: Secondary | ICD-10-CM

## 2023-01-17 DIAGNOSIS — K219 Gastro-esophageal reflux disease without esophagitis: Secondary | ICD-10-CM | POA: Diagnosis present

## 2023-01-17 DIAGNOSIS — Z89611 Acquired absence of right leg above knee: Secondary | ICD-10-CM | POA: Diagnosis not present

## 2023-01-17 DIAGNOSIS — Z79899 Other long term (current) drug therapy: Secondary | ICD-10-CM

## 2023-01-17 DIAGNOSIS — Z90710 Acquired absence of both cervix and uterus: Secondary | ICD-10-CM

## 2023-01-17 DIAGNOSIS — Z6839 Body mass index (BMI) 39.0-39.9, adult: Secondary | ICD-10-CM

## 2023-01-17 DIAGNOSIS — Z89422 Acquired absence of other left toe(s): Secondary | ICD-10-CM

## 2023-01-17 DIAGNOSIS — Z7982 Long term (current) use of aspirin: Secondary | ICD-10-CM

## 2023-01-17 DIAGNOSIS — I70299 Other atherosclerosis of native arteries of extremities, unspecified extremity: Secondary | ICD-10-CM

## 2023-01-17 DIAGNOSIS — Z888 Allergy status to other drugs, medicaments and biological substances status: Secondary | ICD-10-CM

## 2023-01-17 DIAGNOSIS — R7303 Prediabetes: Secondary | ICD-10-CM | POA: Diagnosis present

## 2023-01-17 DIAGNOSIS — I700 Atherosclerosis of aorta: Secondary | ICD-10-CM | POA: Diagnosis present

## 2023-01-17 HISTORY — PX: LOWER EXTREMITY ANGIOGRAPHY: CATH118251

## 2023-01-17 LAB — BUN: BUN: 13 mg/dL (ref 8–23)

## 2023-01-17 LAB — COMPREHENSIVE METABOLIC PANEL
ALT: 11 U/L (ref 0–44)
AST: 12 U/L — ABNORMAL LOW (ref 15–41)
Albumin: 3 g/dL — ABNORMAL LOW (ref 3.5–5.0)
Alkaline Phosphatase: 96 U/L (ref 38–126)
Anion gap: 10 (ref 5–15)
BUN: 11 mg/dL (ref 8–23)
CO2: 23 mmol/L (ref 22–32)
Calcium: 8.6 mg/dL — ABNORMAL LOW (ref 8.9–10.3)
Chloride: 107 mmol/L (ref 98–111)
Creatinine, Ser: 0.57 mg/dL (ref 0.44–1.00)
GFR, Estimated: >60 mL/min (ref >60)
Glucose, Bld: 112 mg/dL — ABNORMAL HIGH (ref 70–99)
Potassium: 3.3 mmol/L — ABNORMAL LOW (ref 3.5–5.1)
Sodium: 140 mmol/L (ref 135–145)
Total Bilirubin: 0.3 mg/dL (ref 0.3–1.2)
Total Protein: 7 g/dL (ref 6.5–8.1)

## 2023-01-17 LAB — CBC
HCT: 39.3 % (ref 36.0–46.0)
HCT: 35.8 % — ABNORMAL LOW (ref 36.0–46.0)
Hemoglobin: 12.3 g/dL (ref 12.0–15.0)
Hemoglobin: 11.4 g/dL — ABNORMAL LOW (ref 12.0–15.0)
MCH: 27.7 pg (ref 26.0–34.0)
MCH: 28.1 pg (ref 26.0–34.0)
MCHC: 31.3 g/dL (ref 30.0–36.0)
MCHC: 31.8 g/dL (ref 30.0–36.0)
MCV: 88.5 fL (ref 80.0–100.0)
MCV: 88.2 fL (ref 80.0–100.0)
Platelets: 225 10*3/uL (ref 150–400)
Platelets: 214 10*3/uL (ref 150–400)
RBC: 4.44 MIL/uL (ref 3.87–5.11)
RBC: 4.06 MIL/uL (ref 3.87–5.11)
RDW: 18.8 % — ABNORMAL HIGH (ref 11.5–15.5)
RDW: 18.9 % — ABNORMAL HIGH (ref 11.5–15.5)
WBC: 7.4 10*3/uL (ref 4.0–10.5)
WBC: 6.4 10*3/uL (ref 4.0–10.5)
nRBC: 0 % (ref 0.0–0.2)
nRBC: 0 % (ref 0.0–0.2)

## 2023-01-17 LAB — CREATININE, SERUM
Creatinine, Ser: 0.61 mg/dL (ref 0.44–1.00)
GFR, Estimated: >60 mL/min (ref >60)

## 2023-01-17 LAB — FIBRINOGEN
Fibrinogen: 541 mg/dL — ABNORMAL HIGH (ref 210–475)
Fibrinogen: 471 mg/dL (ref 210–475)

## 2023-01-17 LAB — LACTIC ACID, PLASMA
Lactic Acid, Venous: 1.6 mmol/L (ref 0.5–1.9)
Lactic Acid, Venous: 2.2 mmol/L (ref 0.5–1.9)

## 2023-01-17 SURGERY — LOWER EXTREMITY ANGIOGRAPHY
Anesthesia: Moderate Sedation | Site: Leg Lower | Laterality: Left

## 2023-01-17 MED ORDER — IPRATROPIUM-ALBUTEROL 0.5-2.5 (3) MG/3ML IN SOLN: 3.0000 mL | Freq: Four times a day (QID) | RESPIRATORY_TRACT | Status: DC | PRN

## 2023-01-17 MED ORDER — IODIXANOL 320 MG/ML IV SOLN
INTRAVENOUS | Status: DC | PRN
  Administered 2023-01-17: 40 mL

## 2023-01-17 MED ORDER — SODIUM CHLORIDE 0.9% FLUSH
3.0000 mL | Freq: Two times a day (BID) | INTRAVENOUS | Status: DC
  Administered 2023-01-18: 3 mL via INTRAVENOUS

## 2023-01-17 MED ORDER — PREGABALIN 100 MG PO CAPS: 200.0000 mg | ORAL_CAPSULE | Freq: Three times a day (TID) | ORAL | Status: DC

## 2023-01-17 MED ORDER — ASPIRIN 81 MG PO TBEC
81.0000 mg | DELAYED_RELEASE_TABLET | Freq: Every day | ORAL | Status: DC
  Administered 2023-01-17 – 2023-01-19 (×2): 81 mg via ORAL
  Filled 2023-01-17 (×2): qty 1

## 2023-01-17 MED ORDER — SODIUM CHLORIDE 0.9 % IV SOLN: INTRAVENOUS | Status: DC

## 2023-01-17 MED ORDER — SODIUM CHLORIDE 0.9 % IV SOLN
500.0000 mg | Freq: Every day | INTRAVENOUS | Status: DC
  Administered 2023-01-17 – 2023-01-18 (×2): 500 mg via INTRAVENOUS
  Filled 2023-01-17 (×3): qty 10

## 2023-01-17 MED ORDER — METRONIDAZOLE 500 MG PO TABS: 500.0000 mg | ORAL_TABLET | Freq: Two times a day (BID) | ORAL | Status: DC

## 2023-01-17 MED ORDER — HYDROMORPHONE HCL 1 MG/ML IJ SOLN
1.0000 mg | Freq: Once | INTRAMUSCULAR | Status: AC | PRN
  Administered 2023-01-17: 1 mg via INTRAVENOUS
  Filled 2023-01-17: qty 1

## 2023-01-17 MED ORDER — FENTANYL CITRATE PF 50 MCG/ML IJ SOSY
PREFILLED_SYRINGE | INTRAMUSCULAR | Status: AC
  Filled 2023-01-17: qty 1

## 2023-01-17 MED ORDER — FENTANYL CITRATE (PF) 100 MCG/2ML IJ SOLN
INTRAMUSCULAR | Status: AC
  Filled 2023-01-17: qty 2

## 2023-01-17 MED ORDER — MIDAZOLAM HCL 2 MG/2ML IJ SOLN
INTRAMUSCULAR | Status: AC
  Filled 2023-01-17: qty 2

## 2023-01-17 MED ORDER — CEFAZOLIN SODIUM-DEXTROSE 2-4 GM/100ML-% IV SOLN: 2.0000 g | INTRAVENOUS | Status: AC

## 2023-01-17 MED ORDER — HEPARIN SODIUM (PORCINE) 1000 UNIT/ML IJ SOLN
INTRAMUSCULAR | Status: AC
  Filled 2023-01-17: qty 10

## 2023-01-17 MED ORDER — OXYCODONE-ACETAMINOPHEN 5-325 MG PO TABS
1.0000 | ORAL_TABLET | ORAL | Status: DC | PRN
  Administered 2023-01-17 – 2023-01-19 (×6): 2 via ORAL
  Filled 2023-01-17 (×7): qty 2

## 2023-01-17 MED ORDER — FLUCONAZOLE 50 MG PO TABS
150.0000 mg | ORAL_TABLET | ORAL | Status: AC
  Administered 2023-01-19: 150 mg via ORAL
  Filled 2023-01-17: qty 1

## 2023-01-17 MED ORDER — SODIUM CHLORIDE 0.9 % IV SOLN
1.0000 g | Freq: Three times a day (TID) | INTRAVENOUS | Status: DC
  Administered 2023-01-17 – 2023-01-19 (×5): 1 g via INTRAVENOUS
  Filled 2023-01-17: qty 20
  Filled 2023-01-17 (×3): qty 1
  Filled 2023-01-17 (×3): qty 20

## 2023-01-17 MED ORDER — CITALOPRAM HYDROBROMIDE 20 MG PO TABS
40.0000 mg | ORAL_TABLET | Freq: Every day | ORAL | Status: DC
  Administered 2023-01-19: 40 mg via ORAL
  Filled 2023-01-17: qty 2

## 2023-01-17 MED ORDER — SODIUM CHLORIDE 0.9 % IV SOLN
1.0000 mg/h | INTRAVENOUS | Status: AC
  Administered 2023-01-17: 1 mg/h
  Filled 2023-01-17: qty 10

## 2023-01-17 MED ORDER — FAMOTIDINE 20 MG PO TABS: 40.0000 mg | ORAL_TABLET | Freq: Once | ORAL | Status: DC | PRN

## 2023-01-17 MED ORDER — METHYLPREDNISOLONE SODIUM SUCC 125 MG IJ SOLR: 125.0000 mg | Freq: Once | INTRAMUSCULAR | Status: DC | PRN

## 2023-01-17 MED ORDER — MIDAZOLAM HCL 2 MG/2ML IJ SOLN
INTRAMUSCULAR | Status: DC | PRN
  Administered 2023-01-17 (×3): 1 mg via INTRAVENOUS
  Administered 2023-01-17: 2 mg via INTRAVENOUS
  Administered 2023-01-17: 1 mg via INTRAVENOUS
  Administered 2023-01-17: .5 mg via INTRAVENOUS
  Administered 2023-01-17 (×3): 1 mg via INTRAVENOUS
  Administered 2023-01-17: .5 mg via INTRAVENOUS
  Administered 2023-01-17: 2 mg via INTRAVENOUS

## 2023-01-17 MED ORDER — METOPROLOL TARTRATE 50 MG PO TABS
50.0000 mg | ORAL_TABLET | Freq: Two times a day (BID) | ORAL | Status: DC
  Administered 2023-01-17 – 2023-01-19 (×2): 50 mg via ORAL
  Filled 2023-01-17 (×3): qty 1

## 2023-01-17 MED ORDER — CEFAZOLIN SODIUM-DEXTROSE 2-4 GM/100ML-% IV SOLN
INTRAVENOUS | Status: AC
  Administered 2023-01-17: 2 g via INTRAVENOUS
  Filled 2023-01-17: qty 100

## 2023-01-17 MED ORDER — ONDANSETRON HCL 4 MG/2ML IJ SOLN: 4.0000 mg | Freq: Four times a day (QID) | INTRAMUSCULAR | Status: DC | PRN

## 2023-01-17 MED ORDER — SODIUM CHLORIDE 0.9 % IV SOLN: 250.0000 mL | INTRAVENOUS | Status: DC | PRN

## 2023-01-17 MED ORDER — ISOSORBIDE MONONITRATE ER 30 MG PO TB24
60.0000 mg | ORAL_TABLET | Freq: Every day | ORAL | Status: DC
  Administered 2023-01-17 – 2023-01-19 (×2): 60 mg via ORAL
  Filled 2023-01-17 (×2): qty 2

## 2023-01-17 MED ORDER — HEPARIN (PORCINE) 25000 UT/250ML-% IV SOLN: 600.0000 [IU]/h | INTRAVENOUS | Status: DC

## 2023-01-17 MED ORDER — MIDAZOLAM HCL 2 MG/2ML IJ SOLN
INTRAMUSCULAR | Status: AC
  Filled 2023-01-17: qty 4

## 2023-01-17 MED ORDER — ATORVASTATIN CALCIUM 20 MG PO TABS
80.0000 mg | ORAL_TABLET | Freq: Every day | ORAL | Status: DC
  Administered 2023-01-17 – 2023-01-18 (×2): 80 mg via ORAL
  Filled 2023-01-17 (×2): qty 4
  Filled 2023-01-17: qty 1

## 2023-01-17 MED ORDER — FERROUS SULFATE 325 (65 FE) MG PO TABS
325.0000 mg | ORAL_TABLET | Freq: Every day | ORAL | Status: DC
  Administered 2023-01-19: 325 mg via ORAL
  Filled 2023-01-17: qty 1

## 2023-01-17 MED ORDER — DEXMEDETOMIDINE HCL IN NACL 400 MCG/100ML IV SOLN
0.0000 ug/kg/h | INTRAVENOUS | Status: DC
  Administered 2023-01-17: 0.4 ug/kg/h via INTRAVENOUS
  Administered 2023-01-17: 0.7 ug/kg/h via INTRAVENOUS
  Administered 2023-01-18: 0.6 ug/kg/h via INTRAVENOUS
  Administered 2023-01-18: 0.5 ug/kg/h via INTRAVENOUS
  Administered 2023-01-18 – 2023-01-19 (×3): 0.7 ug/kg/h via INTRAVENOUS
  Filled 2023-01-17 (×7): qty 100

## 2023-01-17 MED ORDER — MORPHINE SULFATE (PF) 2 MG/ML IV SOLN: 2.0000 mg | INTRAVENOUS | Status: DC | PRN

## 2023-01-17 MED ORDER — FENTANYL CITRATE PF 50 MCG/ML IJ SOSY
PREFILLED_SYRINGE | INTRAMUSCULAR | Status: AC
  Filled 2023-01-17: qty 2

## 2023-01-17 MED ORDER — LACTATED RINGERS IV BOLUS
1000.0000 mL | Freq: Once | INTRAVENOUS | Status: AC
  Administered 2023-01-17: 1000 mL via INTRAVENOUS

## 2023-01-17 MED ORDER — PREGABALIN 75 MG PO CAPS
200.0000 mg | ORAL_CAPSULE | Freq: Three times a day (TID) | ORAL | Status: DC
  Administered 2023-01-17 – 2023-01-19 (×4): 200 mg via ORAL
  Filled 2023-01-17 (×4): qty 1

## 2023-01-17 MED ORDER — HEPARIN SODIUM (PORCINE) 1000 UNIT/ML IJ SOLN
INTRAMUSCULAR | Status: DC | PRN
  Administered 2023-01-17: 5000 [IU] via INTRAVENOUS

## 2023-01-17 MED ORDER — OMADACYCLINE TOSYLATE 150 MG PO TABS: 300.0000 mg | ORAL_TABLET | Freq: Every day | ORAL | Status: DC

## 2023-01-17 MED ORDER — SODIUM CHLORIDE 0.9 % IV SOLN
0.5000 mg/h | INTRAVENOUS | Status: DC
  Administered 2023-01-18: 0.5 mg/h
  Filled 2023-01-17 (×2): qty 10

## 2023-01-17 MED ORDER — ALTEPLASE 2 MG IJ SOLR
INTRAMUSCULAR | Status: AC
  Filled 2023-01-17: qty 4

## 2023-01-17 MED ORDER — MIDAZOLAM HCL 2 MG/ML PO SYRP: 8.0000 mg | ORAL_SOLUTION | Freq: Once | ORAL | Status: DC | PRN

## 2023-01-17 MED ORDER — DIPHENHYDRAMINE HCL 50 MG/ML IJ SOLN: 50.0000 mg | Freq: Once | INTRAMUSCULAR | Status: DC | PRN

## 2023-01-17 MED ORDER — ALTEPLASE 2 MG IJ SOLR
INTRAMUSCULAR | Status: DC | PRN
  Administered 2023-01-17: 4 mg
  Administered 2023-01-17: 8 mg

## 2023-01-17 MED ORDER — SODIUM CHLORIDE 0.9% FLUSH: 3.0000 mL | INTRAVENOUS | Status: DC | PRN

## 2023-01-17 MED ORDER — POTASSIUM CHLORIDE CRYS ER 20 MEQ PO TBCR
40.0000 meq | EXTENDED_RELEASE_TABLET | Freq: Once | ORAL | Status: AC
  Administered 2023-01-17: 40 meq via ORAL
  Filled 2023-01-17: qty 2

## 2023-01-17 MED ORDER — PANTOPRAZOLE SODIUM 40 MG PO TBEC
40.0000 mg | DELAYED_RELEASE_TABLET | Freq: Every day | ORAL | Status: DC
  Administered 2023-01-17 – 2023-01-19 (×2): 40 mg via ORAL
  Filled 2023-01-17 (×2): qty 1

## 2023-01-17 MED ORDER — FENTANYL CITRATE (PF) 100 MCG/2ML IJ SOLN
INTRAMUSCULAR | Status: DC | PRN
  Administered 2023-01-17: 25 ug via INTRAVENOUS
  Administered 2023-01-17: 50 ug via INTRAVENOUS
  Administered 2023-01-17 (×2): 25 ug via INTRAVENOUS
  Administered 2023-01-17: 50 ug via INTRAVENOUS
  Administered 2023-01-17 (×5): 25 ug via INTRAVENOUS
  Administered 2023-01-17: 50 ug via INTRAVENOUS
  Administered 2023-01-17: 25 ug via INTRAVENOUS

## 2023-01-17 MED ORDER — ALTEPLASE 2 MG IJ SOLR
INTRAMUSCULAR | Status: AC
  Filled 2023-01-17: qty 8

## 2023-01-17 MED ORDER — HEPARIN (PORCINE) 25000 UT/250ML-% IV SOLN
INTRAVENOUS | Status: AC
  Administered 2023-01-17: 600 [IU]/h via INTRAVENOUS
  Filled 2023-01-17: qty 250

## 2023-01-17 MED ORDER — ENSURE ENLIVE PO LIQD
237.0000 mL | Freq: Two times a day (BID) | ORAL | Status: DC
  Administered 2023-01-18 – 2023-01-19 (×2): 237 mL via ORAL

## 2023-01-17 SURGICAL SUPPLY — 29 items
BALLN ULTRVRSE 5X60X75C (BALLOONS) ×1
BALLOON ULTRVRSE 5X60X75C (BALLOONS) IMPLANT
CANISTER PENUMBRA ENGINE (MISCELLANEOUS) IMPLANT
CANNULA 5F STIFF (CANNULA) IMPLANT
CATH ANGIO 5F PIGTAIL 100CM (CATHETERS) IMPLANT
CATH BEACON 5 .035 100 C2 TIP (CATHETERS) IMPLANT
CATH G 5FX100 (CATHETERS) IMPLANT
CATH INDIGO CAT6 KIT (CATHETERS) IMPLANT
CATH INFUS 135CMX50CM (CATHETERS) IMPLANT
CATH NAVICROSS ANGLED 135CM (MICROCATHETER) IMPLANT
CATH TEMPO 5F RIM 65CM (CATHETERS) IMPLANT
DEVICE TORQUE .025-.038 (MISCELLANEOUS) IMPLANT
DRAPE BRACHIAL (DRAPES) IMPLANT
GLIDEWIRE ADV .035X260CM (WIRE) IMPLANT
GLIDEWIRE ANGLED SS 035X260CM (WIRE) IMPLANT
GUIDEWIRE ANGLED .035X260CM (WIRE) IMPLANT
KIT ENCORE 26 ADVANTAGE (KITS) IMPLANT
PACK ANGIOGRAPHY (CUSTOM PROCEDURE TRAY) ×1 IMPLANT
SET INTRO CAPELLA COAXIAL (SET/KITS/TRAYS/PACK) IMPLANT
SHEATH 6FR 75 DEST SLENDER (SHEATH) IMPLANT
SHEATH BRITE TIP 5FRX11 (SHEATH) IMPLANT
SHEATH HALO 035 6FRX10 (SHEATH) IMPLANT
SHEATH HALO 6F 45 (SHEATH) IMPLANT
SUT PROLENE 0 CT 1 30 (SUTURE) IMPLANT
SYR MEDRAD MARK 7 150ML (SYRINGE) IMPLANT
TUBING CONTRAST HIGH PRESS 72 (TUBING) IMPLANT
WIRE EMERALD 3MM-J .035X150CM (WIRE) IMPLANT
WIRE G V18X300CM (WIRE) IMPLANT
WIRE SUPRACORE 300CM (WIRE) IMPLANT

## 2023-01-17 NOTE — Progress Notes (Signed)
Spoke with Dr. Lucky Cowboy and made MD aware that patient c/o pain in left arm and hand. Left arm very swollen compared to right. Dr. Lucky Cowboy stated "unfortunately that is expected with the sheath in place in the left radial." No new orders given. Palpable pulse and pulse ox 96% on left thumb.

## 2023-01-17 NOTE — Consult Note (Signed)
NAME:  Carla [name], MRN:  [id number], DOB:  25-May-1961, LOS: 0 ADMISSION DATE:  01/17/2023  CHIEF COMPLAINT:  acute limb ischemia    History of Present Illness:   62 year old female who returns today for follow-up wound evaluation of her right above-knee amputation.   The patient has had multiple instances of debridement.   Following this was due to concern for possible osteomyelitis but it was found that the patient had worsening of her hematoma.    Following her most recent debridement in the wound has some good areas of granulation however she still has some significant slough and some tissue noted near the distal portion.    Also concern for the patient has her left lower extremity.  She has recently noticed that there has been some bruising and a wound on her left foot.    She notes that her left leg has been giving her extensive amounts of pain and worse at night.  She has not been able to sleep during the evening.    On exam her thigh is warm but approaching her leg it becomes cool near the feet and toes.  Patient also has some light mottling.  She has an axillary femoral graft.  She had a lower extremity arterial duplex which showed dampened monophasic flow throughout the left leg there is concern today that the patient has an occlusion of her graft.    PATIENT SEEN BY VASCULAR SURGERY FOR ACUTE LIMB ISCHEMIA  Left Upper Extremity: This demonstrated occlusion of the axillary to femoral artery bypass at its origin.  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  The brachial artery was not occluded by the sheath.  There was flow through both the radial and ulnar arteries to the hand.  Left Lower Extremity:  This demonstrated lesion of the axillary to femoral artery bypass with a large amount of thrombus.  There is thrombus in the common femoral artery.  Both the profunda femoris and superficial femoral arteries reconstituted and appeared to have flow beyond the  occlusion.  LYSIS CATHETER PLACED FOR THROMBOLYTIC THERAPY TRANSFERRED TO ICU FOR ASSESSMENT   Significant Hospital Events: Including procedures, antibiotic start and stop dates in addition to other pertinent events   Admitted to ICU for acute limb ischemia s/p lysis therapy     Micro Data:  Cultures grew, ESBL E. coli, MRSA and enteroccus. patient was on IV Dapto + Meropenem during last admission and disharged with IV Abx until 01/21/23   Antimicrobials:   Antibiotics Given (last 72 hours)     Date/Time Action Medication Dose Rate   01/17/23 1318 New Bag/Given   ceFAZolin (ANCEF) IVPB 2g/100 mL premix 2 g 200 mL/hr            Interim History / Subjective:  Patient needs IV pain meds and precedex Patient moaning in pain UNABLE TO PROVIDE ROS    Objective   Blood pressure (!) 148/130, pulse 91, temperature 98.8 F (37.1 C), temperature source Oral, resp. rate 16, height 5\' 2"  (1.575 m), weight 97.5 kg, SpO2 96 %.        Intake/Output Summary (Last 24 hours) at 01/17/2023 1648 Last data filed at 01/17/2023 1606 Gross per 24 hour  Intake 240 ml  Output --  Net 240 ml   Filed Weights   01/17/23 1156  Weight: 97.5 kg     REVIEW OF SYSTEMS  PATIENT IS UNABLE TO PROVIDE COMPLETE REVIEW OF SYSTEMS DUE TO SEVERE CRITICAL ILLNESS AND SEVERE PAIN  PHYSICAL EXAMINATION:  GENERAL:critically ill appearing MORBIDLY OBESE EYES: Pupils equal, round, reactive to light.  No scleral icterus.  MOUTH: Moist mucosal membrane.  NECK: Supple.  PULMONARY: Lungs clear to auscultation, +rhonchi,  CARDIOVASCULAR: S1 and S2.  Regular rate and rhythm GASTROINTESTINAL: Soft, nontender, -distended. Positive bowel sounds.  NEUROLOGIC: lethargic SKIN SEE PICS BELOW SKIN:  Labs/imaging that I havepersonally reviewed  (right click and "Reselect all SmartList Selections" daily)      ASSESSMENT AND PLAN SYNOPSIS  SEVERE CRITICAL LIMB ISCHEMIA PATIENT IN CRITICAL  SITUATION HIGH CHANCE OF LOSING LEG  ADMIT TO ICU FOR LYTIC THERAPY PAIN MEDS AS NEEDED OXYGEN AS NEEDED PRECEDEX TO BE STARTED OBTAIN LABS   CARDIAC ICU monitoring    NEUROLOGY Acute  metabolic encephalopathy, need for sedation   INFECTIOUS DISEASE -continue antibiotics as prescribed -follow up cultures -follow up ID consultation  ENDO - ICU hypoglycemic\Hyperglycemia protocol -check FSBS per protocol   GI GI PROPHYLAXIS as indicated  NUTRITIONAL STATUS DIET-->NPO Constipation protocol as indicated   ELECTROLYTES -follow labs as needed -replace as needed -pharmacy consultation and following   ACUTE ANEMIA- TRANSFUSE AS NEEDED CONSIDER TRANSFUSION  IF HGB<7      Best practice (right click and "Reselect all SmartList Selections" daily)  Diet: NPO DVT prophylaxis: Contraindicated Mobility:  bed rest  Code Status:  FULL Disposition:ICU  Labs   CBC    Component Value Date/Time   WBC 5.8 01/04/2023 1216   RBC 4.46 01/04/2023 1216   HGB 12.2 01/04/2023 1216   HGB 13.4 02/12/2015 1137   HCT 39.5 01/04/2023 1216   HCT 40.9 02/12/2015 1137   PLT 223 01/04/2023 1216   PLT 272 02/12/2015 1137   MCV 88.6 01/04/2023 1216   MCV 90 02/12/2015 1137   MCH 27.4 01/04/2023 1216   MCHC 30.9 01/04/2023 1216   RDW 18.8 (H) 01/04/2023 1216   RDW 14.8 (H) 02/12/2015 1137   LYMPHSABS 1.6 01/04/2023 1216   LYMPHSABS 1.7 08/27/2014 1145   MONOABS 0.4 01/04/2023 1216   MONOABS 0.4 08/27/2014 1145   EOSABS 0.3 01/04/2023 1216   EOSABS 0.0 08/27/2014 1145   BASOSABS 0.0 01/04/2023 1216   BASOSABS 0.0 08/27/2014 1145      Latest Ref Rng & Units 01/17/2023   12:09 PM 01/04/2023   12:16 PM 12/11/2022    3:55 AM  BMP  Glucose 70 - 99 mg/dL  103  130   BUN 8 - 23 mg/dL 13  18  16    Creatinine 0.44 - 1.00 mg/dL 0.61  0.60  0.65   Sodium 135 - 145 mmol/L  139  132   Potassium 3.5 - 5.1 mmol/L  4.4  4.2   Chloride 98 - 111 mmol/L  103  92   CO2 22 - 32 mmol/L   26  30   Calcium 8.9 - 10.3 mg/dL  9.1  8.5       Basic Metabolic Panel: Recent Labs  Lab 01/17/23 1209  BUN 13  CREATININE 0.61   GFR: Estimated Creatinine Clearance: 78.5 mL/min (by C-G formula based on SCr of 0.61 mg/dL). No results for input(s): "PROCALCITON", "WBC", "LATICACIDVEN" in the last 168 hours.  Liver Function Tests: No results for input(s): "AST", "ALT", "ALKPHOS", "BILITOT", "PROT", "ALBUMIN" in the last 168 hours. No results for input(s): "LIPASE", "AMYLASE" in the last 168 hours. No results for input(s): "AMMONIA" in the last 168 hours.  ABG    Component Value Date/Time   HCO3 24.3 04/07/2018 2135  ACIDBASEDEF 0.8 04/07/2018 2135   O2SAT 98.2 04/07/2018 2135     Coagulation Profile: No results for input(s): "INR", "PROTIME" in the last 168 hours.  Cardiac Enzymes: No results for input(s): "CKTOTAL", "CKMB", "CKMBINDEX", "TROPONINI" in the last 168 hours.  HbA1C: Hemoglobin A1C  Date/Time Value Ref Range Status  07/22/2012 05:49 AM 5.7 4.2 - 6.3 % Final    Comment:    The American Diabetes Association recommends that a primary goal of therapy should be <7% and that physicians should reevaluate the treatment regimen in patients with HbA1c values consistently >8%.    Hgb A1c MFr Bld  Date/Time Value Ref Range Status  07/23/2022 05:19 PM 6.2 (H) 4.8 - 5.6 % Final    Comment:    (NOTE) Pre diabetes:          5.7%-6.4%  Diabetes:              >6.4%  Glycemic control for   <7.0% adults with diabetes   11/19/2019 04:19 AM 6.3 (H) 4.8 - 5.6 % Final    Comment:    (NOTE) Pre diabetes:          5.7%-6.4% Diabetes:              >6.4% Glycemic control for   <7.0% adults with diabetes     CBG: No results for input(s): "GLUCAP" in the last 168 hours.   Past Medical History:  She,  has a past medical history of Abdominal aortic atherosclerosis ([place]), Acute focal ischemia of small intestine ([place]), Acute right-sided low back pain with  right-sided sciatica (06/13/2017), AKI (acute kidney injury) ([place]) (07/29/2017), Anemia, Anginal pain ([place]), Anxiety and depression, Baker's cyst of knee, right, Bell's palsy, CAD (coronary artery disease), CHF (congestive heart failure) ([place]), Chronic anticoagulation, Chronic idiopathic constipation, Chronic mesenteric ischemia ([place]), Chronic pain, COPD (chronic obstructive pulmonary disease) ([place]), Dyspnea, Embolus of superior mesenteric artery ([place]), Essential hypertension with goal blood pressure less than 140/90 (01/19/2016), Gastroesophageal reflux disease (01/19/2016), GERD (gastroesophageal reflux disease), H/O colectomy, History of 2019 novel coronavirus disease (COVID-19) (11/14/2019), History of kidney stones, Hyperlipidemia, Hypertension, Morbid obesity with BMI of 40.0-44.9, adult ([place]), NSTEMI (non-ST elevated myocardial infarction) ([place]) (06/27/2021), Occlusive mesenteric ischemia ([place]) (06/19/2017), Osteoarthritis, PAD (peripheral artery disease) ([place]), PAF (paroxysmal atrial fibrillation) ([place]), Palpitations, Peripheral edema, Personal history of other malignant neoplasm of skin (06/01/2016), Pneumonia, Pre-diabetes, Primary osteoarthritis of both knees (06/13/2017), SBO (small bowel obstruction) ([place]) (08/06/2017), Skin cancer of nose, 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   Surgical History:   Past Surgical History:  Procedure Laterality Date   AMPUTATION Right 04/06/2022   Procedure: AMPUTATION ABOVE KNEE;  Surgeon: Algernon Huxley, MD;  Location: [facility];  Service: Vascular;  Laterality: Right;   AMPUTATION  12/07/2022   Procedure: AMPUTATION ABOVE KNEE REVISION WITH RESECTION OF FEMUR;  Surgeon: Algernon Huxley, MD;  Location: [facility];  Service: Vascular;;   AMPUTATION TOE Left 12/09/2019   Procedure: AMPUTATION TOE MPJ left;  Surgeon: Caroline More, DPM;  Location: [facility];  Service: Podiatry;  Laterality: Left;   APPENDECTOMY     APPLICATION OF WOUND  VAC  12/01/2021   Procedure: APPLICATION OF WOUND VAC;  Surgeon: Algernon Huxley, MD;  Location: [facility];  Service: Vascular;;   APPLICATION OF WOUND VAC Right 05/03/2022   Procedure: APPLICATION OF WOUND VAC;  Surgeon: Algernon Huxley, MD;  Location: [facility];  Service: Vascular;  Laterality: Right;   APPLICATION OF WOUND VAC  Left 07/23/2022   Procedure: APPLICATION OF WOUND VAC;  Surgeon: Serafina Mitchell, MD;  Location: [facility];  Service: Vascular;  Laterality: Left;  [place]   APPLICATION OF WOUND VAC Right 08/28/2022   Procedure: APPLICATION OF WOUND VAC;  Surgeon: Algernon Huxley, MD;  Location: [facility];  Service: Vascular;  Laterality: Right;   APPLICATION OF WOUND VAC  12/07/2022   Procedure: APPLICATION OF WOUND VAC;  Surgeon: Algernon Huxley, MD;  Location: [facility];  Service: Vascular;;   AXILLARY-FEMORAL BYPASS GRAFT Left 07/23/2022   Procedure: BYPASS GRAFT AXILLA-BIFEMORAL;  Surgeon: Serafina Mitchell, MD;  Location: [facility];  Service: Vascular;  Laterality: Left;   CHOLECYSTECTOMY     COLON SURGERY     COLONOSCOPY WITH PROPOFOL N/A 03/23/2022   Procedure: COLONOSCOPY WITH PROPOFOL;  Surgeon: Jonathon Bellows, MD;  Location: [facility]COPY;  Service: Gastroenterology;  Laterality: N/A;  rectal bleed   EMBOLECTOMY OR THROMBECTOMY, WITH OR WITHOUT CATHETER; FEMOROPOPLITEAL, AORTOILIAC ARTERY, BY LEG INCISION N/A 06/21/2020   Left - Decomp Fasciotomy Leg; Ant &/Or Lat Compart Only; Midline - Angiography, Visceral, Selective Or Supraselective (With Or Without Flush Aortogram); Left - Revascularize, Endovasc, Open/Percut, Iliac Artery, Unilat, Initial Vessel; W/Translum Stent, W/Angioplasty; Right - Revascularize, Endovasc, Open/Percut, Iliac Artery, Ea Add`L Ipsilateral; W/Translumin Stent, W/Angioplasty; Location [facility];   ENDARTERECTOMY FEMORAL Left 12/01/2021   Procedure: ENDARTERECTOMY FEMORAL;  Surgeon: Algernon Huxley, MD;  Location: [facility];  Service: Vascular;  Laterality: Left;   ENDOVASCULAR REPAIR/STENT  GRAFT Left 12/01/2021   Procedure: ENDOVASCULAR REPAIR/STENT GRAFT;  Surgeon: Algernon Huxley, MD;  Location: [place] [facility];  Service: Cardiovascular;  Laterality: Left;   INCISION AND DRAINAGE OF WOUND Right 05/03/2022   Procedure: IRRIGATION AND DEBRIDEMENT RIGHT STUMP;  Surgeon: Algernon Huxley, MD;  Location: [facility];  Service: Vascular;  Laterality: Right;   IRRIGATION AND DEBRIDEMENT ABSCESS Right 08/28/2022   Procedure: AKA IRRIGATION AND DEBRIDEMENT ABSCESS;  Surgeon: Algernon Huxley, MD;  Location: [facility];  Service: Vascular;  Laterality: Right;   LAPAROTOMY N/A 08/08/2017   Procedure: EXPLORATORY LAPAROTOMY POSSIBLE BOWEL RESECTION;  Surgeon: Jules Husbands, MD;  Location: [facility];  Service: General;  Laterality: N/A;   LEFT HEART CATH AND CORONARY ANGIOGRAPHY N/A 05/17/2021   Procedure: LEFT HEART CATH AND CORONARY ANGIOGRAPHY;  Surgeon: Yolonda Kida, MD;  Location: [place] [facility];  Service: Cardiovascular;  Laterality: N/A;   LOWER EXTREMITY ANGIOGRAPHY Left 11/17/2019   Procedure: LOWER EXTREMITY ANGIOGRAPHY;  Surgeon: Algernon Huxley, MD;  Location: [place] [facility];  Service: Cardiovascular;  Laterality: Left;   LOWER EXTREMITY ANGIOGRAPHY Right 12/13/2020   Procedure: LOWER EXTREMITY ANGIOGRAPHY;  Surgeon: Algernon Huxley, MD;  Location: [place] [facility];  Service: Cardiovascular;  Laterality: Right;   LOWER EXTREMITY ANGIOGRAPHY Right 01/10/2021   Procedure: LOWER EXTREMITY ANGIOGRAPHY;  Surgeon: Algernon Huxley, MD;  Location: [place] [facility];  Service: Cardiovascular;  Laterality: Right;   LOWER EXTREMITY ANGIOGRAPHY Right 01/24/2021   Procedure: LOWER EXTREMITY ANGIOGRAPHY;  Surgeon: Algernon Huxley, MD;  Location: [place] [facility];  Service: Cardiovascular;  Laterality: Right;   LOWER EXTREMITY ANGIOGRAPHY Bilateral 06/28/2021   Procedure: Lower Extremity Angiography;  Surgeon: Algernon Huxley, MD;  Location: [place] [facility];  Service: Cardiovascular;   Laterality: Bilateral;   LOWER EXTREMITY ANGIOGRAPHY Right 11/28/2021   Procedure: LOWER EXTREMITY ANGIOGRAPHY;  Surgeon: Algernon Huxley, MD;  Location: [place] [facility];  Service: Cardiovascular;  Laterality: Right;   LOWER EXTREMITY ANGIOGRAPHY Left 11/30/2021   Procedure: Lower Extremity Angiography;  Surgeon: Algernon Huxley, MD;  Location: [place] [facility];  Service: Cardiovascular;  Laterality: Left;   LOWER EXTREMITY ANGIOGRAPHY Right 02/22/2022   Procedure: Lower Extremity Angiography;  Surgeon: Algernon Huxley, MD;  Location: [place] [facility];  Service: Cardiovascular;  Laterality: Right;   LOWER EXTREMITY ANGIOGRAPHY Right 03/13/2022   Procedure: Lower Extremity Angiography;  Surgeon: Algernon Huxley, MD;  Location: [place] [facility];  Service: Cardiovascular;  Laterality: Right;   LOWER EXTREMITY ANGIOGRAPHY Right 03/24/2022   Procedure: Lower Extremity Angiography;  Surgeon: Algernon Huxley, MD;  Location: [place] [facility];  Service: Cardiovascular;  Laterality: Right;   LOWER EXTREMITY INTERVENTION N/A 11/19/2019   Procedure: LOWER EXTREMITY INTERVENTION;  Surgeon: Algernon Huxley, MD;  Location: [place] [facility];  Service: Cardiovascular;  Laterality: N/A;   LOWER EXTREMITY INTERVENTION Bilateral 06/29/2021   Procedure: LOWER EXTREMITY INTERVENTION;  Surgeon: Algernon Huxley, MD;  Location: [place] [facility];  Service: Cardiovascular;  Laterality: Bilateral;   TEE WITHOUT CARDIOVERSION N/A 06/22/2017   Procedure: TRANSESOPHAGEAL ECHOCARDIOGRAM (TEE);  Surgeon: Wellington Hampshire, MD;  Location: [facility];  Service: Cardiovascular;  Laterality: N/A;   TOTAL KNEE ARTHROPLASTY Right 07/11/2018   Procedure: TOTAL KNEE ARTHROPLASTY;  Surgeon: Corky Mull, MD;  Location: [facility];  Service: Orthopedics;  Laterality: Right;   VAGINAL HYSTERECTOMY     VISCERAL ARTERY INTERVENTION N/A 06/20/2017   Procedure: Visceral Artery Intervention, possible aortic thrombectomy;  Surgeon: Algernon Huxley, MD;   Location: [place] [facility];  Service: Cardiovascular;  Laterality: N/A;   VISCERAL ARTERY INTERVENTION N/A 01/28/2018   Procedure: VISCERAL ARTERY INTERVENTION;  Surgeon: Algernon Huxley, MD;  Location: [place] [facility];  Service: Cardiovascular;  Laterality: N/A;     Social History:   reports that she quit smoking about 14 months ago. Her smoking use included cigarettes. She smoked an average of 1 pack per day. She has never used smokeless tobacco. She reports that she does not drink alcohol and does not use drugs.   Family History:  Her family history includes Breast cancer in her mother and paternal aunt; Heart attack in her mother; Heart disease in her father and mother; Hypertension in her father and mother.   Allergies Allergies  Allergen Reactions   Gabapentin     Tremors    Bactrim [Sulfamethoxazole-Trimethoprim] Itching     Home Medications  Prior to Admission medications   Medication Sig Start Date End Date Taking? Authorizing Provider  apixaban (ELIQUIS) 5 MG TABS tablet Take 1 tablet (5 mg total) by mouth 2 (two) times daily. 01/25/21  Yes Stegmayer, Janalyn Harder, PA-C  aspirin EC 81 MG tablet Take 81 mg by mouth daily. Swallow whole.   Yes [provider]  atorvastatin (LIPITOR) 80 MG tablet Take 80 mg by mouth at bedtime.   Yes [provider]  COMBIVENT RESPIMAT 20-100 MCG/ACT AERS respimat Inhale 1 puff into the lungs every 6 (six) hours as needed. 03/27/22  Yes [provider]  esomeprazole (NEXIUM) 20 MG capsule Take 20 mg by mouth daily. 07/06/22  Yes [provider]  feeding supplement (ENSURE ENLIVE / ENSURE PLUS) LIQD Take 237 mLs by mouth 2 (two) times daily between meals. 05/06/22  Yes Fritzi Mandes, MD  ferrous sulfate 325 (65 FE) MG tablet Take 325 mg by mouth daily.   Yes [provider]  fluconazole (DIFLUCAN) 150 MG tablet Take 150 mg by mouth every 3 (three) days. 01/08/23  Yes [provider]  isosorbide  mononitrate (IMDUR) 60 MG 24 hr tablet Take 60 mg by mouth daily. 01/23/22  Yes [provider]  metoprolol tartrate (LOPRESSOR) 25 MG tablet Take 0.5 tablets (12.5 mg total) by mouth 2 (two) times daily. Hold if SBP is 100 or less or HR is less than 60. Notify Provider Patient taking differently: Take 50 mg by mouth 2 (two) times daily. Hold if SBP is 100 or less or HR is less than 60. Notify Provider 09/08/22  Yes Emeterio Reeve, DO  metroNIDAZOLE (FLAGYL) 500 MG tablet Take 1 tablet (500 mg total) by mouth 2 (two) times daily. 01/05/23  Yes Tsosie Billing, MD  pregabalin (LYRICA) 200 MG capsule Take 1 capsule (200 mg total) by mouth in the morning, at noon, and at bedtime. 10/04/22 02/01/23 Yes Kris Hartmann, NP  alum & mag hydroxide-simeth (MAALOX/MYLANTA) 200-200-20 MG/5ML suspension Take 15-30 mLs by mouth every 2 (two) hours as needed for indigestion. Patient not taking: Reported on 01/17/2023 08/11/22   Lorella Nimrod, MD  bisacodyl (DULCOLAX) 5 MG EC tablet Take 1 tablet (5 mg total) by mouth daily as needed for moderate constipation. Patient not taking: Reported on 01/17/2023 09/08/22   Emeterio Reeve, DO  citalopram (CELEXA) 40 MG tablet Take 1 tablet (40 mg total) by mouth daily. 02/04/22 09/07/22  Enzo Bi, MD  lidocaine (LIDODERM) 5 % Place 1 patch onto the skin daily. Remove & Discard patch within 12 hours or as directed by MD 08/11/22   Lorella Nimrod, MD  Omadacycline Tosylate 150 MG TABS Take 2 tablets (300 mg total) by mouth daily. 01/04/23   Tsosie Billing, MD  oxyCODONE-acetaminophen (PERCOCET/ROXICET) 5-325 MG tablet Take 1-2 tablets by mouth every 4 (four) hours as needed for severe pain. Patient not taking: Reported on 01/17/2023 12/13/22   Sharen Hones, MD  polyethylene glycol (MIRALAX / GLYCOLAX) 17 g packet Take 17 g by mouth daily. Patient not taking: Reported on 01/17/2023 08/12/22   Lorella Nimrod, MD        Critical Care Time devoted to  patient care services described in this note is 75 minutes.   Critical care was necessary to treat /prevent imminent and life-threatening deterioration.   PATIENT WITH VERY POOR PROGNOSIS I ANTICIPATE PROLONGED ICU LOS  RECOMMEND DNR/DNI STATUS RECOMMEND [place]  Patient is critically ill. Patient with Multiorgan failure and at high risk for cardiac arrest and death.    Corrin Parker, M.D.  [facility]  Medical Director [place] Director [facility] Cardio-Pulmonary Department

## 2023-01-17 NOTE — Consult Note (Signed)
Pharmacy Antibiotic Note  Carla [name] is a 62 y.o. female admitted on 01/17/2023 with acute limb ischemia. Patient with PMH significant for recent R AKA and stump infection for which patient was started on IV abx and discharged on IV Dapto + Ertapenem to complete 6 weeks of therapy 01/21/23. Patient followed by ID outpatient and lost IV access. Patient transitioned to PO metronidazole + omadacycline on 01/04/23. Patient admitted 3/27 for acute limb ischemia on left leg. Will transition to IV ABX to complete therapy as patient does not have PO medication omadacycline available. Pharmacy has been consulted for daptomycin and meropenem dosing based off culture results from last [place]: Initiate meropenem 1 gram Q8H Initiate Daptomycin 500 mg Q24H CK ordered for tomorrow  Height: 5\' 2"  (157.5 cm) Weight: 97.5 kg (215 lb) IBW/kg (Calculated) : 50.1  Temp (24hrs), Avg:98.8 F (37.1 C), Min:98.8 F (37.1 C), Max:98.8 F (37.1 C)  Recent Labs  Lab 01/17/23 1209  CREATININE 0.61    Estimated Creatinine Clearance: 78.5 mL/min (by C-G formula based on SCr of 0.61 mg/dL).    Allergies  Allergen Reactions   Gabapentin     Tremors    Bactrim [Sulfamethoxazole-Trimethoprim] Itching    Antimicrobials this admission: 3/27 daptomycin >>  3/27 meropenem >>   Dose adjustments this admission:   Microbiology results: 2/11: abscess: ESBL E, coli, pan-sensitive E. Faecalis 2/15: knee wound: MRSA   Thank you for allowing pharmacy to be a part of this patient's care.  Dorothe Pea, PharmD, BCPS Clinical Pharmacist   01/17/2023 4:56 PM

## 2023-01-17 NOTE — Consult Note (Signed)
PHARMACY CONSULT NOTE - FOLLOW UP  Pharmacy Consult for Electrolyte Monitoring and Replacement   Recent Labs: Potassium (mmol/L)  Date Value  01/17/2023 3.3 (L)  02/12/2015 2.9 (L)   Magnesium (mg/dL)  Date Value  12/11/2022 2.4   Calcium (mg/dL)  Date Value  01/17/2023 8.6 (L)   Calcium, Total (mg/dL)  Date Value  02/12/2015 9.0   Albumin (g/dL)  Date Value  01/17/2023 3.0 (L)  09/14/2020 3.9  01/19/2015 3.8   Phosphorus (mg/dL)  Date Value  12/11/2022 4.7 (H)   Sodium (mmol/L)  Date Value  01/17/2023 140  02/12/2015 140     Assessment: 61 year old female with history of recent right AKA with stump infection, afib and DVT on apixaban PTA, presented with acute left leg iscemia s/p revascularization. Pharmacy consulted to monitor and replace electrolytes while in ICU.  Goal of Therapy:  Electrolytes WNL  Plan:  Hypokalemia: Will replace with PO KCL 40 mEq x 1 Recheck all electrolytes with AM labs  Dorothe Pea, PharmD, BCPS Clinical Pharmacist   01/17/2023 6:09 PM

## 2023-01-17 NOTE — Interval H&P Note (Signed)
History and Physical Interval Note:  01/17/2023 12:48 PM  Carla [name]  has presented today for surgery, with the diagnosis of LLE Angio   BARD   ASO w ulceration.  The various methods of treatment have been discussed with the patient and family. After consideration of risks, benefits and other options for treatment, the patient has consented to  Procedure(s): Lower Extremity Angiography (Left) as a surgical intervention.  The patient's history has been reviewed, patient examined, no change in status, stable for surgery.  I have reviewed the patient's chart and labs.  Questions were answered to the patient's satisfaction.     [name]

## 2023-01-17 NOTE — Op Note (Signed)
[facility]  Percutaneous Study/Intervention Procedural Note   Date of Surgery: 01/17/2023  Surgeon(s):Mairim Bade    Assistants:none  Pre-operative Diagnosis: PAD with rest [name], previous left axillary to femoral artery bypass  Post-operative diagnosis:  Same  Procedure(s) Performed:             1.  Ultrasound guidance for vascular access left radial artery              2.  Catheter placement into left common femoral artery from left radial approach through the axillary artery to femoral artery bypass             3.  Left upper extremity angiogram and selective left lower extremity angiogram             4.  Catheter directed thrombolytic therapy with a total of 12 mg of tPA to the left axillary artery to femoral artery bypass             5.  Mechanical thrombectomy to the left axillary artery to femoral artery bypass with the penumbra CAT 6 device as well as mechanical thrombectomy to the left common femoral artery and proximal superficial femoral artery  6.  Percutaneous transluminal angioplasty of the left axillary artery to bypass anastomosis and the most proximal portion of the bypass graft with 5 mm diameter by 6 cm length angioplasty balloon             7.  Placement of an infusion catheter for continuous thrombolytic therapy to the left axillary artery to femoral artery bypass using a 135 cm total length 50 cm working length catheter  EBL: 100 cc  Contrast: 40 cc  Fluoro Time: 17.7 minutes  Moderate Conscious Sedation Time: approximately 94 minutes using 12 mg of Versed and 375 mcg of Fentanyl              Indications:  Patient is a 62 y.o.female with an ischemic left lower extremity after previous axillary artery to femoral artery bypass. The patient is brought in for angiography for further evaluation and potential treatment.  Due to the limb threatening nature of the situation, angiogram was performed for attempted limb salvage. The  patient is aware that if the procedure fails, amputation would be expected.  The patient also understands that even with successful revascularization, amputation may still be required due to the severity of the situation.  Risks and benefits are discussed and informed consent is obtained.   Procedure:  The patient was identified and appropriate procedural time out was performed.  The patient was then placed supine on the table and prepped and draped in the usual sterile fashion. Moderate conscious sedation was administered during a face to face encounter with the patient throughout the procedure with my supervision of the RN administering medicines and monitoring the patient's vital signs, pulse oximetry, telemetry and mental status throughout from the start of the procedure until the patient was taken to the recovery room. Ultrasound was used to evaluate the left radial artery.  It was patent .  A digital ultrasound image was acquired.  A micropuncture needle was used to access the left radial artery under direct ultrasound guidance and a permanent image was performed.  A micropuncture wire and sheath were then placed.  A 0.035 J wire was advanced without resistance and a 5Fr sheath was placed.  Pigtail catheter was placed into the proximal left subclavian artery and angiogram was performed. 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  The brachial artery was patent.  There was outflow to the hand through both the radial and ulnar arteries.  The axillary artery to femoral artery bypass was occluded with only a very short stump seen.  I then cannulated the bypass graft which was tedious and ultimately required a rim catheter and a Glidewire and then I exchanged for a Bentson Hanifin the catheter and ultimately an advantage wire to get the catheter distally.  The graft was occluded with a large amount of thrombus.  Tediously, I was able to get down to the left common femoral  artery through the bypass graft.  Selective left lower extremity angiogram was then performed. This demonstrated lesion of the axillary to femoral artery bypass with a large amount of thrombus.  There is thrombus in the common femoral artery.  Both the profunda femoris and superficial femoral arteries reconstituted and appeared to have flow beyond the occlusion. It was felt that it was in the patient's best interest to proceed with intervention after these images to avoid a second procedure and a larger amount of contrast and fluoroscopy based off of the findings from the initial angiogram. The patient was systemically heparinized and a 6 French 75 cm a low sheath was then placed over the Terumo Advantage wire with its distal portion about 10 to 15 cm into the occluded bypass graft. I then instilled 8 mg of tPA in the bypass graft and allowed this to dwell.  I then used the penumbra CAT 6 catheter and for mechanical thrombectomy throughout the axillary artery to femoral artery bypass and into the common femoral artery and proximal superficial femoral artery.  Some thrombus was removed, but there remained continued thrombosis and minimal flow.  Multiple passes were made and we exchanged for a 0.018 wire to get a better limb but there remained continued thrombosis.  We ultimately exchanged for a shorter sheath that terminated before the bypass and I was able to see a very tight hyperplastic stenosis in the proximal portion of the bypass to the axillary artery.  I performed balloon angioplasty to this area with a 5 mm diameter by 6 cm length angioplasty balloon inflated to 10 atm for 1 minute.  This resulted in improvement in flow within the graft and a large amount of thrombus remained.  I did not feel there was going to be much chance at restoring patency today without a continuous infusion of tPA.  An additional 4 mg of tPA were given in the graft and I placed a 135 cm total length 50 cm working length catheter with  the most proximal extent at the proximal bypass anastomosis in the distal extent of the catheter terminating in the distal graft above the femoral artery.  This was secured in place as well as the sheath.  She had ulnar outflow and the sheath was nonocclusive in the brachial artery so she should have perfusion to her hand.  The patient was taken to the recovery room in stable condition having tolerated the procedure well.  Findings:               Left Upper Extremity: This demonstrated occlusion of the axillary to femoral artery bypass at its origin.  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  The brachial artery was not occluded by the sheath.  There was flow through both the radial and ulnar arteries to the hand.  Left Lower Extremity:  This demonstrated lesion of the axillary to femoral artery bypass with a large amount of thrombus.  There is thrombus in the common femoral artery.  Both the profunda femoris and superficial femoral arteries reconstituted and appeared to have flow beyond the occlusion.   Disposition: Patient was taken to the recovery room in stable condition having tolerated the procedure well.  Complications: None  Carla [name] 01/17/2023 3:21 PM   This note was created with Dragon Medical transcription system. Any errors in dictation are purely unintentional.

## 2023-01-18 ENCOUNTER — Inpatient Hospital Stay: Payer: 59 | Admitting: Anesthesiology

## 2023-01-18 ENCOUNTER — Encounter: Payer: Self-pay | Admitting: Vascular Surgery

## 2023-01-18 ENCOUNTER — Encounter
Admission: AD | Disposition: A | Discharge status: Home Health | Payer: Self-pay | Source: Home / Self Care | Attending: Vascular Surgery

## 2023-01-18 DIAGNOSIS — Z9889 Other specified postprocedural states: Secondary | ICD-10-CM

## 2023-01-18 DIAGNOSIS — Z7189 Other specified counseling: Secondary | ICD-10-CM | POA: Diagnosis not present

## 2023-01-18 DIAGNOSIS — I998 Other disorder of circulatory system: Secondary | ICD-10-CM | POA: Diagnosis not present

## 2023-01-18 HISTORY — PX: LOWER EXTREMITY INTERVENTION: CATH118252

## 2023-01-18 LAB — FIBRINOGEN
Fibrinogen: 322 mg/dL (ref 210–475)
Fibrinogen: 212 mg/dL (ref 210–475)

## 2023-01-18 LAB — HEMOGLOBIN AND HEMATOCRIT, BLOOD
HCT: 26.4 % — ABNORMAL LOW (ref 36.0–46.0)
HCT: 27.5 % — ABNORMAL LOW (ref 36.0–46.0)
Hemoglobin: 8.3 g/dL — ABNORMAL LOW (ref 12.0–15.0)
Hemoglobin: 8.8 g/dL — ABNORMAL LOW (ref 12.0–15.0)

## 2023-01-18 LAB — BASIC METABOLIC PANEL
Anion gap: 3 — ABNORMAL LOW (ref 5–15)
BUN: 9 mg/dL (ref 8–23)
CO2: 23 mmol/L (ref 22–32)
Calcium: 7.8 mg/dL — ABNORMAL LOW (ref 8.9–10.3)
Chloride: 110 mmol/L (ref 98–111)
Creatinine, Ser: 0.6 mg/dL (ref 0.44–1.00)
GFR, Estimated: >60 mL/min (ref >60)
Glucose, Bld: 118 mg/dL — ABNORMAL HIGH (ref 70–99)
Potassium: 3.6 mmol/L (ref 3.5–5.1)
Sodium: 136 mmol/L (ref 135–145)

## 2023-01-18 LAB — CBC
HCT: 32.6 % — ABNORMAL LOW (ref 36.0–46.0)
Hemoglobin: 10.2 g/dL — ABNORMAL LOW (ref 12.0–15.0)
MCH: 27.9 pg (ref 26.0–34.0)
MCHC: 31.3 g/dL (ref 30.0–36.0)
MCV: 89.1 fL (ref 80.0–100.0)
Platelets: 170 10*3/uL (ref 150–400)
RBC: 3.66 MIL/uL — ABNORMAL LOW (ref 3.87–5.11)
RDW: 18.6 % — ABNORMAL HIGH (ref 11.5–15.5)
WBC: 4.4 10*3/uL (ref 4.0–10.5)
nRBC: 0 % (ref 0.0–0.2)

## 2023-01-18 LAB — PHOSPHORUS: Phosphorus: 3.1 mg/dL (ref 2.5–4.6)

## 2023-01-18 LAB — MAGNESIUM: Magnesium: 1.8 mg/dL (ref 1.7–2.4)

## 2023-01-18 LAB — CK: Total CK: 17 U/L — ABNORMAL LOW (ref 38–234)

## 2023-01-18 SURGERY — LOWER EXTREMITY INTERVENTION
Anesthesia: General | Laterality: Left

## 2023-01-18 MED ORDER — GLYCOPYRROLATE 0.2 MG/ML IJ SOLN
INTRAMUSCULAR | Status: DC | PRN
  Administered 2023-01-18: .1 mg via INTRAVENOUS

## 2023-01-18 MED ORDER — ADULT MULTIVITAMIN W/MINERALS CH
1.0000 | ORAL_TABLET | Freq: Every day | ORAL | Status: DC
  Administered 2023-01-19: 1 via ORAL
  Filled 2023-01-18: qty 1

## 2023-01-18 MED ORDER — CHLORHEXIDINE GLUCONATE CLOTH 2 % EX PADS: 6.0000 | MEDICATED_PAD | Freq: Every day | CUTANEOUS | Status: DC

## 2023-01-18 MED ORDER — SODIUM CHLORIDE 0.9 % IV SOLN
250.0000 mL | INTRAVENOUS | Status: DC
  Administered 2023-01-18: 250 mL via INTRAVENOUS

## 2023-01-18 MED ORDER — LIDOCAINE HCL (PF) 2 % IJ SOLN
INTRAMUSCULAR | Status: AC
  Filled 2023-01-18: qty 5

## 2023-01-18 MED ORDER — ZINC SULFATE 220 (50 ZN) MG PO CAPS
220.0000 mg | ORAL_CAPSULE | Freq: Every day | ORAL | Status: DC
  Administered 2023-01-19: 220 mg via ORAL
  Filled 2023-01-18: qty 1

## 2023-01-18 MED ORDER — ORAL CARE MOUTH RINSE: 15.0000 mL | OROMUCOSAL | Status: DC | PRN

## 2023-01-18 MED ORDER — HEPARIN SODIUM (PORCINE) 1000 UNIT/ML IJ SOLN
INTRAMUSCULAR | Status: AC
  Filled 2023-01-18: qty 10

## 2023-01-18 MED ORDER — PROPOFOL 10 MG/ML IV BOLUS
INTRAVENOUS | Status: DC | PRN
  Administered 2023-01-18 (×2): 30 mg via INTRAVENOUS
  Administered 2023-01-18: 20 mg via INTRAVENOUS

## 2023-01-18 MED ORDER — EPHEDRINE 5 MG/ML INJ
INTRAVENOUS | Status: AC
  Filled 2023-01-18: qty 5

## 2023-01-18 MED ORDER — ONDANSETRON HCL 4 MG/2ML IJ SOLN
INTRAMUSCULAR | Status: AC
  Filled 2023-01-18: qty 2

## 2023-01-18 MED ORDER — TIROFIBAN (AGGRASTAT) BOLUS VIA INFUSION
25.0000 ug/kg | Freq: Once | INTRAVENOUS | Status: AC
  Administered 2023-01-18: 2437.5 ug via INTRAVENOUS
  Filled 2023-01-18: qty 49

## 2023-01-18 MED ORDER — NOREPINEPHRINE 4 MG/250ML-% IV SOLN: 2.0000 ug/min | INTRAVENOUS | Status: DC

## 2023-01-18 MED ORDER — TIROFIBAN HCL IN NACL 5-0.9 MG/100ML-% IV SOLN
0.1500 ug/kg/min | INTRAVENOUS | Status: DC
  Administered 2023-01-18 (×2): 0.15 ug/kg/min via INTRAVENOUS
  Filled 2023-01-18 (×5): qty 100

## 2023-01-18 MED ORDER — LIDOCAINE HCL (CARDIAC) PF 100 MG/5ML IV SOSY
PREFILLED_SYRINGE | INTRAVENOUS | Status: DC | PRN
  Administered 2023-01-18: 40 mg via INTRAVENOUS

## 2023-01-18 MED ORDER — KETAMINE HCL 10 MG/ML IJ SOLN
INTRAMUSCULAR | Status: DC | PRN
  Administered 2023-01-18: 15 mg via INTRAVENOUS

## 2023-01-18 MED ORDER — PROPOFOL 1000 MG/100ML IV EMUL
INTRAVENOUS | Status: AC
  Filled 2023-01-18: qty 100

## 2023-01-18 MED ORDER — LACTATED RINGERS IV BOLUS
1000.0000 mL | Freq: Once | INTRAVENOUS | Status: AC
  Administered 2023-01-18: 1000 mL via INTRAVENOUS

## 2023-01-18 MED ORDER — PHENYLEPHRINE 80 MCG/ML (10ML) SYRINGE FOR IV PUSH (FOR BLOOD PRESSURE SUPPORT)
PREFILLED_SYRINGE | INTRAVENOUS | Status: AC
  Filled 2023-01-18: qty 10

## 2023-01-18 MED ORDER — GLYCOPYRROLATE 0.2 MG/ML IJ SOLN
INTRAMUSCULAR | Status: AC
  Filled 2023-01-18: qty 1

## 2023-01-18 MED ORDER — LACTATED RINGERS IV SOLN: INTRAVENOUS | Status: DC

## 2023-01-18 MED ORDER — PHENYLEPHRINE HCL (PRESSORS) 10 MG/ML IV SOLN
INTRAVENOUS | Status: DC | PRN
  Administered 2023-01-18: 120 ug via INTRAVENOUS
  Administered 2023-01-18: 80 ug via INTRAVENOUS
  Administered 2023-01-18: 120 ug via INTRAVENOUS

## 2023-01-18 MED ORDER — LACTATED RINGERS IV BOLUS
500.0000 mL | Freq: Once | INTRAVENOUS | Status: AC
  Administered 2023-01-18: 500 mL via INTRAVENOUS

## 2023-01-18 MED ORDER — VITAMIN C 500 MG PO TABS
500.0000 mg | ORAL_TABLET | Freq: Two times a day (BID) | ORAL | Status: DC
  Administered 2023-01-19: 500 mg via ORAL
  Filled 2023-01-18: qty 1

## 2023-01-18 MED ORDER — KETAMINE HCL 50 MG/5ML IJ SOSY
PREFILLED_SYRINGE | INTRAMUSCULAR | Status: AC
  Filled 2023-01-18: qty 5

## 2023-01-18 MED ORDER — IODIXANOL 320 MG/ML IV SOLN
INTRAVENOUS | Status: DC | PRN
  Administered 2023-01-18: 60 mL

## 2023-01-18 MED ORDER — HEPARIN SODIUM (PORCINE) 1000 UNIT/ML IJ SOLN
INTRAMUSCULAR | Status: DC | PRN
  Administered 2023-01-18: 4000 [IU] via INTRAVENOUS

## 2023-01-18 MED ORDER — ONDANSETRON HCL 4 MG/2ML IJ SOLN
INTRAMUSCULAR | Status: DC | PRN
  Administered 2023-01-18: 4 mg via INTRAVENOUS

## 2023-01-18 MED ORDER — PROPOFOL 500 MG/50ML IV EMUL
INTRAVENOUS | Status: DC | PRN
  Administered 2023-01-18: 100 ug/kg/min via INTRAVENOUS

## 2023-01-18 SURGICAL SUPPLY — 18 items
BALLN DORADO 8X40X80 (BALLOONS) ×1
BALLN LUTONIX 018 6X80X130 (BALLOONS) ×1
BALLN ULTRVRSE 8X40X75C (BALLOONS) ×1
BALLOON DORADO 8X40X80 (BALLOONS) IMPLANT
BALLOON LUTONIX 018 6X80X130 (BALLOONS) IMPLANT
BALLOON ULTRVRSE 8X40X75C (BALLOONS) IMPLANT
CANISTER PENUMBRA ENGINE (MISCELLANEOUS) IMPLANT
CATH INDIGO CAT6 KIT (CATHETERS) IMPLANT
CATH KUMPE SOFT-VU 5FR 65 (CATHETERS) IMPLANT
DEVICE RAD TR BAND REGULAR (VASCULAR PRODUCTS) IMPLANT
DRAPE BRACHIAL (DRAPES) IMPLANT
GLIDEWIRE ADV .035X180CM (WIRE) IMPLANT
KIT ENCORE 26 ADVANTAGE (KITS) IMPLANT
PACK ANGIOGRAPHY (CUSTOM PROCEDURE TRAY) IMPLANT
SHEATH HALO 035 6FRX10 (SHEATH) IMPLANT
STENT LIFESTREAM 7X26X80 (Permanent Stent) IMPLANT
STENT VIABAHN 6X7.5X120 (Permanent Stent) IMPLANT
WIRE G V18X300CM (WIRE) IMPLANT

## 2023-01-18 NOTE — Transfer of Care (Signed)
Immediate Anesthesia Transfer of Care Note  Patient: Carla [name]  Procedure(s) Performed: LOWER EXTREMITY INTERVENTION (Left)  Patient Location: PACU  Anesthesia Type:General  Level of Consciousness: awake  Airway & Oxygen Therapy: Patient Spontanous Breathing and Patient connected to face mask oxygen  Post-op Assessment: Report given to RN and Post -op Vital signs reviewed and stable  Post vital signs: Reviewed and stable  Last Vitals:  Vitals Value Taken Time  BP 162/57 01/18/23 1132  Temp 97.5   Pulse 69 01/18/23 1138  Resp 20 01/18/23 1138  SpO2 100 % 01/18/23 1138  Vitals shown include unvalidated device data.  Last Pain:  Vitals:   01/18/23 0800  TempSrc: Oral  PainSc: Asleep      Patients Stated Pain Goal: 3 ([id number])  Complications:  Encounter Notable Events  Notable Event Outcome Phase Comment  None  Intraprocedure

## 2023-01-18 NOTE — Interval H&P Note (Signed)
History and Physical Interval Note:  01/18/2023 9:10 AM  Carla [name]  has presented today for surgery, with the diagnosis of Left radial- lysis.  The various methods of treatment have been discussed with the patient and family. After consideration of risks, benefits and other options for treatment, the patient has consented to  Procedure(s): LOWER EXTREMITY INTERVENTION (Left) as a surgical intervention.  The patient's history has been reviewed, patient examined, no change in status, stable for surgery.  I have reviewed the patient's chart and labs.  Questions were answered to the patient's satisfaction.     [name]

## 2023-01-18 NOTE — Op Note (Signed)
[facility]  Percutaneous Study/Intervention Procedural Note   Date of Surgery: 01/17/2023 - 01/18/2023  Surgeon(s):Priest Lockridge    Assistants:none  Pre-operative Diagnosis: PAD with rest [name]  Post-operative diagnosis:  Same  Procedure(s) Performed:             1.  Left upper extremity angiogram, angiogram of the axillofemoral bypass, and the proximal left lower             2.  Mechanical thrombectomy to the left axillofemoral bypass graft with the penumbra CAT 6 device             3.  Stent placement to the proximal portion of the left axillofemoral bypass graft with 6 mm diameter by 7.5 cm length Viabahn stent             4.  Stent placement to the left subclavian artery with 7 mm diameter by 26 mm length Lifestream stent             5.  TR band placement left wrist    EBL: 250 cc  Contrast: 60 cc  Fluoro Time: 9.8 minutes  Moderate Conscious Sedation provided by anesthesia with propofol.              Indications:  Patient is a 62 y.o.female with an ischemic left lower extremity status post overnight thrombolytic therapy to her left axillofemoral bypass graft. The patient is brought in for angiography for further evaluation and potential treatment.  Due to the limb threatening nature of the situation, angiogram was performed for attempted limb salvage. The patient is aware that if the procedure fails, amputation would be expected.  The patient also understands that even with successful revascularization, amputation may still be required due to the severity of the situation.  Risks and benefits are discussed and informed consent is obtained.   Procedure:  The patient was identified and appropriate procedural time out was performed.  The patient was then placed supine on the table and prepped and draped in the usual sterile fashion. Moderate conscious sedation was administered by our anesthesia colleagues with propofol.  The existing lysis  catheter was removed and a V18 wire was placed into the superficial femoral artery distally.  Imaging through the sheath was performed to evaluate the left upper extremity as well as the left axillofemoral bypass graft.  There appeared to be some stenosis in the subclavian artery proximal to the bypass that we would elucidate further with a catheter placed more proximally later on in the procedure.  Evaluation of the axillofemoral bypass graft was done analogous to selective left lower extremity angiogram was then performed. This demonstrated that the axillofemoral bypass graft still had residual thrombus particularly in the proximal portion.  This was associated with a 70 to 80% stenosis in the proximal portion of the bypass graft.  The mid and distal portions of the graft were clear of thrombus.  The distal anastomosis was widely patent as were the proximal profunda femoris artery and superficial femoral arteries.  The patient was systemically heparinized and elected to perform mechanical thrombectomy within the graft with the penumbra CAT 6 device.  2 passes were made and this demonstrated only a small amount of residual thrombus proximally associated with the stenosis.  I elected to stent this area.  A 6 mm diameter by 7.5 cm length Viabahn stent was deployed in the proximal portion of the axillofemoral bypass graft and then postdilated with a 6 mm diameter  Lutonix drug-coated balloon with excellent angiographic completion result and less than 10% residual stenosis.  I then turned my attention to the subclavian lesion.  With a Kumpe catheter and the proximal subclavian artery, magnified images were performed to help further evaluate the stenosis.  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  This was about 3 to 4 cm proximal to the axillofemoral bypass graft.  I elected to stent this area and a 7 mm diameter by 26 mm length Lifestream  stent was deployed across the lesion and postdilated with an 8 mm balloon with excellent angiographic completion result and no significant residual stenosis. I elected to terminate the procedure. The sheath was removed and a TR band was placed with with excellent hemostatic result. The patient was taken to the recovery room in stable condition having tolerated the procedure well.  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  The brachial artery was patent beyond this area.             Left lower Extremity: The axillofemoral bypass graft still had residual thrombus particularly in the proximal portion.  This was associated with a 70 to 80% stenosis in the proximal portion of the bypass graft.  The mid and distal portions of the graft were clear of thrombus.  The distal anastomosis was widely patent as were the proximal profunda femoris artery and superficial femoral arteries.   Disposition: Patient was taken to the recovery room in stable condition having tolerated the procedure well.  Complications: None  Carla [name] 01/18/2023 11:13 AM   This note was created with Dragon Medical transcription system. Any errors in dictation are purely unintentional.

## 2023-01-18 NOTE — Consult Note (Signed)
NAME:  Carla [name], MRN:  [id number], DOB:  08-Sep-1961, LOS: 1 ADMISSION DATE:  01/17/2023  CHIEF COMPLAINT:  acute limb ischemia    History of Present Illness:   62 year old female who returns today for follow-up wound evaluation of her right above-knee amputation.   The patient has had multiple instances of debridement.   Following this was due to concern for possible osteomyelitis but it was found that the patient had worsening of her hematoma.    Following her most recent debridement in the wound has some good areas of granulation however she still has some significant slough and some tissue noted near the distal portion.    Also concern for the patient has her left lower extremity.  She has recently noticed that there has been some bruising and a wound on her left foot.    She notes that her left leg has been giving her extensive amounts of pain and worse at night.  She has not been able to sleep during the evening.    On exam her thigh is warm but approaching her leg it becomes cool near the feet and toes.  Patient also has some light mottling.  She has an axillary femoral graft.  She had a lower extremity arterial duplex which showed dampened monophasic flow throughout the left leg there is concern today that the patient has an occlusion of her graft.    PATIENT SEEN BY VASCULAR SURGERY FOR ACUTE LIMB ISCHEMIA  Left Upper Extremity: This demonstrated occlusion of the axillary to femoral artery bypass at its origin.  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  The brachial artery was not occluded by the sheath.  There was flow through both the radial and ulnar arteries to the hand.  Left Lower Extremity:  This demonstrated lesion of the axillary to femoral artery bypass with a large amount of thrombus.  There is thrombus in the common femoral artery.  Both the profunda femoris and superficial femoral arteries reconstituted and appeared to have flow beyond the  occlusion.  LYSIS CATHETER PLACED FOR THROMBOLYTIC THERAPY TRANSFERRED TO ICU FOR ASSESSMENT   Significant Hospital Events: Including procedures, antibiotic start and stop dates in addition to other pertinent events   3/27 Admitted to ICU for acute limb ischemia s/p lysis therapy 3/28 s/p Mechanical thrombectomy to the left axillofemoral bypass graft had bleeding from insertion site     Micro Data:  Cultures grew, ESBL E. coli, MRSA and enteroccus. patient was on IV Dapto + Meropenem during last admission and disharged with IV Abx until 01/21/23   Antimicrobials:   Antibiotics Given (last 72 hours)     Date/Time Action Medication Dose Rate   01/17/23 1318 New Bag/Given   ceFAZolin (ANCEF) IVPB 2g/100 mL premix 2 g 200 mL/hr   01/17/23 1755 New Bag/Given   meropenem (MERREM) 1 g in sodium chloride 0.9 % 100 mL IVPB 1 g 200 mL/hr   01/17/23 2022 New Bag/Given   DAPTOmycin (CUBICIN) 500 mg in sodium chloride 0.9 % IVPB 500 mg 120 mL/hr   01/18/23 0138 New Bag/Given   meropenem (MERREM) 1 g in sodium chloride 0.9 % 100 mL IVPB 1 g 200 mL/hr   01/18/23 1842 New Bag/Given   meropenem (MERREM) 1 g in sodium chloride 0.9 % 100 mL IVPB 1 g 200 mL/hr            Interim History / Subjective:  Patient needs IV pain meds and precedex Low BP given fluid boluses  Wants to live  Patient has very poor prognosis High risk for cardiac arrest and death    Objective   Blood pressure (!) 85/50, pulse 65, temperature 99.9 F (37.7 C), temperature source Oral, resp. rate 17, height 5\' 2"  (1.575 m), weight 97.5 kg, SpO2 97 %.        Intake/Output Summary (Last 24 hours) at 01/18/2023 1846 Last data filed at 01/18/2023 1200 Gross per 24 hour  Intake 2833.36 ml  Output 750 ml  Net 2083.36 ml    Filed Weights   01/17/23 1156  Weight: 97.5 kg     PHYSICAL EXAMINATION:  GENERAL:critically ill appearing MORBIDLY OBESE EYES: Pupils equal, round, reactive to light.  No scleral  icterus.  PULMONARY: Lungs clear to auscultation, +rhonchi,  CARDIOVASCULAR: S1 and S2.  Regular rate and rhythm GASTROINTESTINAL: Soft, nontender, -distended. Positive bowel sounds.  NEUROLOGIC: lethargic SKIN SEE PICS BELOW SKIN:  Labs/imaging that I havepersonally reviewed  (right click and "Reselect all SmartList Selections" daily)      ASSESSMENT AND PLAN SYNOPSIS  SEVERE CRITICAL LIMB ISCHEMIA PATIENT IN CRITICAL SITUATION HIGH CHANCE OF LOSING LEG  ADMIT TO ICU FOR LYTIC THERAPY PAIN MEDS AS NEEDED OXYGEN AS NEEDED PRECEDEX  VASC SURGERY FOLLOWING ABX CHANGED  ID consulted     CARDIAC ICU monitoring  INFECTIOUS DISEASE -continue antibiotics as prescribed -follow up cultures -follow up ID consultation   ENDO - ICU hypoglycemic\Hyperglycemia protocol -check FSBS per protocol   GI GI PROPHYLAXIS as indicated  NUTRITIONAL STATUS DIET--> as tolerated Constipation protocol as indicated   ELECTROLYTES -follow labs as needed -replace as needed -pharmacy consultation and following   ACUTE ANEMIA- TRANSFUSE AS NEEDED CONSIDER TRANSFUSION  IF HGB<7      Best practice (right click and "Reselect all SmartList Selections" daily)  Diet: NPO DVT prophylaxis: Contraindicated Mobility:  bed rest  Code Status:  FULL Disposition:ICU PALLIATIVE CARE CONSULTED  Labs   CBC    Component Value Date/Time   WBC 4.4 01/18/2023 0341   RBC 3.66 (L) 01/18/2023 0341   HGB 8.3 (L) 01/18/2023 1632   HGB 13.4 02/12/2015 1137   HCT 26.4 (L) 01/18/2023 1632   HCT 40.9 02/12/2015 1137   PLT 170 01/18/2023 0341   PLT 272 02/12/2015 1137   MCV 89.1 01/18/2023 0341   MCV 90 02/12/2015 1137   MCH 27.9 01/18/2023 0341   MCHC 31.3 01/18/2023 0341   RDW 18.6 (H) 01/18/2023 0341   RDW 14.8 (H) 02/12/2015 1137   LYMPHSABS 1.6 01/04/2023 1216   LYMPHSABS 1.7 08/27/2014 1145   MONOABS 0.4 01/04/2023 1216   MONOABS 0.4 08/27/2014 1145   EOSABS 0.3 01/04/2023  1216   EOSABS 0.0 08/27/2014 1145   BASOSABS 0.0 01/04/2023 1216   BASOSABS 0.0 08/27/2014 1145      Latest Ref Rng & Units 01/18/2023    3:41 AM 01/17/2023    5:23 PM 01/17/2023   12:09 PM  BMP  Glucose 70 - 99 mg/dL 118  112    BUN 8 - 23 mg/dL 9  11  13    Creatinine 0.44 - 1.00 mg/dL 0.60  0.57  0.61   Sodium 135 - 145 mmol/L 136  140    Potassium 3.5 - 5.1 mmol/L 3.6  3.3    Chloride 98 - 111 mmol/L 110  107    CO2 22 - 32 mmol/L 23  23    Calcium 8.9 - 10.3 mg/dL 7.8  8.6  Basic Metabolic Panel: Recent Labs  Lab 01/17/23 1209 01/17/23 1723 01/18/23 0341  NA  --  140 136  K  --  3.3* 3.6  CL  --  107 110  CO2  --  23 23  GLUCOSE  --  112* 118*  BUN 13 11 9   CREATININE 0.61 0.57 0.60  CALCIUM  --  8.6* 7.8*  MG  --   --  1.8  PHOS  --   --  3.1    GFR: Estimated Creatinine Clearance: 78.5 mL/min (by C-G formula based on SCr of 0.6 mg/dL). Recent Labs  Lab 01/17/23 1640 01/17/23 1723 01/17/23 2025 01/18/23 0341  WBC 7.4  --  6.4 4.4  LATICACIDVEN  --  1.6 2.2*  --     Liver Function Tests: Recent Labs  Lab 01/17/23 1723  AST 12*  ALT 11  ALKPHOS 96  BILITOT 0.3  PROT 7.0  ALBUMIN 3.0*   No results for input(s): "LIPASE", "AMYLASE" in the last 168 hours. No results for input(s): "AMMONIA" in the last 168 hours.  ABG    Component Value Date/Time   HCO3 24.3 04/07/2018 2135   ACIDBASEDEF 0.8 04/07/2018 2135   O2SAT 98.2 04/07/2018 2135     Coagulation Profile: No results for input(s): "INR", "PROTIME" in the last 168 hours.  Cardiac Enzymes: Recent Labs  Lab 01/18/23 0341  CKTOTAL 17*    HbA1C: Hemoglobin A1C  Date/Time Value Ref Range Status  07/22/2012 05:49 AM 5.7 4.2 - 6.3 % Final    Comment:    The American Diabetes Association recommends that a primary goal of therapy should be <7% and that physicians should reevaluate the treatment regimen in patients with HbA1c values consistently >8%.    Hgb A1c MFr Bld   Date/Time Value Ref Range Status  07/23/2022 05:19 PM 6.2 (H) 4.8 - 5.6 % Final    Comment:    (NOTE) Pre diabetes:          5.7%-6.4%  Diabetes:              >6.4%  Glycemic control for   <7.0% adults with diabetes   11/19/2019 04:19 AM 6.3 (H) 4.8 - 5.6 % Final    Comment:    (NOTE) Pre diabetes:          5.7%-6.4% Diabetes:              >6.4% Glycemic control for   <7.0% adults with diabetes     CBG: No results for input(s): "GLUCAP" in the last 168 hours.   Past Medical History:  She,  has a past medical history of Abdominal aortic atherosclerosis ([place]), Acute focal ischemia of small intestine ([place]), Acute right-sided low back pain with right-sided sciatica (06/13/2017), AKI (acute kidney injury) ([place]) (07/29/2017), Anemia, Anginal pain ([place]), Anxiety and depression, Baker's cyst of knee, right, Bell's palsy, CAD (coronary artery disease), CHF (congestive heart failure) ([place]), Chronic anticoagulation, Chronic idiopathic constipation, Chronic mesenteric ischemia ([place]), Chronic pain, COPD (chronic obstructive pulmonary disease) ([place]), Dyspnea, Embolus of superior mesenteric artery ([place]), Essential hypertension with goal blood pressure less than 140/90 (01/19/2016), Gastroesophageal reflux disease (01/19/2016), GERD (gastroesophageal reflux disease), H/O colectomy, History of 2019 novel coronavirus disease (COVID-19) (11/14/2019), History of kidney stones, Hyperlipidemia, Hypertension, Morbid obesity with BMI of 40.0-44.9, adult ([place]), NSTEMI (non-ST elevated myocardial infarction) ([place]) (06/27/2021), Occlusive mesenteric ischemia ([place]) (06/19/2017), Osteoarthritis, PAD (peripheral artery disease) ([place]), PAF (paroxysmal atrial fibrillation) ([place]), Palpitations, Peripheral edema, Personal history of other malignant neoplasm  of skin (06/01/2016), Pneumonia, Pre-diabetes, Primary osteoarthritis of both knees (06/13/2017), SBO (small bowel obstruction) ([place]) (08/06/2017), Skin cancer of  nose, 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   Surgical History:   Past Surgical History:  Procedure Laterality Date   AMPUTATION Right 04/06/2022   Procedure: AMPUTATION ABOVE KNEE;  Surgeon: Algernon Huxley, MD;  Location: [facility];  Service: Vascular;  Laterality: Right;   AMPUTATION  12/07/2022   Procedure: AMPUTATION ABOVE KNEE REVISION WITH RESECTION OF FEMUR;  Surgeon: Algernon Huxley, MD;  Location: [facility];  Service: Vascular;;   AMPUTATION TOE Left 12/09/2019   Procedure: AMPUTATION TOE MPJ left;  Surgeon: Caroline More, DPM;  Location: [facility];  Service: Podiatry;  Laterality: Left;   APPENDECTOMY     APPLICATION OF WOUND VAC  12/01/2021   Procedure: APPLICATION OF WOUND VAC;  Surgeon: Algernon Huxley, MD;  Location: [facility];  Service: Vascular;;   APPLICATION OF WOUND VAC Right 05/03/2022   Procedure: APPLICATION OF WOUND VAC;  Surgeon: Algernon Huxley, MD;  Location: [facility];  Service: Vascular;  Laterality: Right;   APPLICATION OF WOUND VAC Left 07/23/2022   Procedure: APPLICATION OF WOUND VAC;  Surgeon: Serafina Mitchell, MD;  Location: [facility];  Service: Vascular;  Laterality: Left;  [place]   APPLICATION OF WOUND VAC Right 08/28/2022   Procedure: APPLICATION OF WOUND VAC;  Surgeon: Algernon Huxley, MD;  Location: [facility];  Service: Vascular;  Laterality: Right;   APPLICATION OF WOUND VAC  12/07/2022   Procedure: APPLICATION OF WOUND VAC;  Surgeon: Algernon Huxley, MD;  Location: [facility];  Service: Vascular;;   AXILLARY-FEMORAL BYPASS GRAFT Left 07/23/2022   Procedure: BYPASS GRAFT AXILLA-BIFEMORAL;  Surgeon: Serafina Mitchell, MD;  Location: [facility];  Service: Vascular;  Laterality: Left;   CHOLECYSTECTOMY     COLON SURGERY     COLONOSCOPY WITH PROPOFOL N/A 03/23/2022   Procedure: COLONOSCOPY WITH PROPOFOL;  Surgeon: Jonathon Bellows, MD;  Location: [facility];  Service: Gastroenterology;  Laterality: N/A;  rectal bleed   EMBOLECTOMY OR THROMBECTOMY, WITH OR  WITHOUT CATHETER; FEMOROPOPLITEAL, AORTOILIAC ARTERY, BY LEG INCISION N/A 06/21/2020   Left - Decomp Fasciotomy Leg; Ant &/Or Lat Compart Only; Midline - Angiography, Visceral, Selective Or Supraselective (With Or Without Flush Aortogram); Left - Revascularize, Endovasc, Open/Percut, Iliac Artery, Unilat, Initial Vessel; W/Translum Stent, W/Angioplasty; Right - Revascularize, Endovasc, Open/Percut, Iliac Artery, Ea Add`L Ipsilateral; W/Translumin Stent, W/Angioplasty; Location [facility];   ENDARTERECTOMY FEMORAL Left 12/01/2021   Procedure: ENDARTERECTOMY FEMORAL;  Surgeon: Algernon Huxley, MD;  Location: [facility];  Service: Vascular;  Laterality: Left;   ENDOVASCULAR REPAIR/STENT GRAFT Left 12/01/2021   Procedure: ENDOVASCULAR REPAIR/STENT GRAFT;  Surgeon: Algernon Huxley, MD;  Location: [place] [facility];  Service: Cardiovascular;  Laterality: Left;   INCISION AND DRAINAGE OF WOUND Right 05/03/2022   Procedure: IRRIGATION AND DEBRIDEMENT RIGHT STUMP;  Surgeon: Algernon Huxley, MD;  Location: [facility];  Service: Vascular;  Laterality: Right;   IRRIGATION AND DEBRIDEMENT ABSCESS Right 08/28/2022   Procedure: AKA IRRIGATION AND DEBRIDEMENT ABSCESS;  Surgeon: Algernon Huxley, MD;  Location: [facility];  Service: Vascular;  Laterality: Right;   LAPAROTOMY N/A 08/08/2017   Procedure: EXPLORATORY LAPAROTOMY POSSIBLE BOWEL RESECTION;  Surgeon: Jules Husbands, MD;  Location: [facility];  Service: General;  Laterality: N/A;   LEFT HEART CATH AND CORONARY ANGIOGRAPHY N/A 05/17/2021   Procedure: LEFT HEART CATH AND CORONARY ANGIOGRAPHY;  Surgeon: Lujean Amel  D, MD;  Location: [place] [facility];  Service: Cardiovascular;  Laterality: N/A;   LOWER EXTREMITY ANGIOGRAPHY Left 11/17/2019   Procedure: LOWER EXTREMITY ANGIOGRAPHY;  Surgeon: Algernon Huxley, MD;  Location: [place] [facility];  Service: Cardiovascular;  Laterality: Left;   LOWER EXTREMITY ANGIOGRAPHY Right 12/13/2020   Procedure: LOWER EXTREMITY ANGIOGRAPHY;  Surgeon:  Algernon Huxley, MD;  Location: [place] [facility];  Service: Cardiovascular;  Laterality: Right;   LOWER EXTREMITY ANGIOGRAPHY Right 01/10/2021   Procedure: LOWER EXTREMITY ANGIOGRAPHY;  Surgeon: Algernon Huxley, MD;  Location: [place] [facility];  Service: Cardiovascular;  Laterality: Right;   LOWER EXTREMITY ANGIOGRAPHY Right 01/24/2021   Procedure: LOWER EXTREMITY ANGIOGRAPHY;  Surgeon: Algernon Huxley, MD;  Location: [place] [facility];  Service: Cardiovascular;  Laterality: Right;   LOWER EXTREMITY ANGIOGRAPHY Bilateral 06/28/2021   Procedure: Lower Extremity Angiography;  Surgeon: Algernon Huxley, MD;  Location: [place] [facility];  Service: Cardiovascular;  Laterality: Bilateral;   LOWER EXTREMITY ANGIOGRAPHY Right 11/28/2021   Procedure: LOWER EXTREMITY ANGIOGRAPHY;  Surgeon: Algernon Huxley, MD;  Location: [place] [facility];  Service: Cardiovascular;  Laterality: Right;   LOWER EXTREMITY ANGIOGRAPHY Left 11/30/2021   Procedure: Lower Extremity Angiography;  Surgeon: Algernon Huxley, MD;  Location: [place] [facility];  Service: Cardiovascular;  Laterality: Left;   LOWER EXTREMITY ANGIOGRAPHY Right 02/22/2022   Procedure: Lower Extremity Angiography;  Surgeon: Algernon Huxley, MD;  Location: [place] [facility];  Service: Cardiovascular;  Laterality: Right;   LOWER EXTREMITY ANGIOGRAPHY Right 03/13/2022   Procedure: Lower Extremity Angiography;  Surgeon: Algernon Huxley, MD;  Location: [place] [facility];  Service: Cardiovascular;  Laterality: Right;   LOWER EXTREMITY ANGIOGRAPHY Right 03/24/2022   Procedure: Lower Extremity Angiography;  Surgeon: Algernon Huxley, MD;  Location: [place] [facility];  Service: Cardiovascular;  Laterality: Right;   LOWER EXTREMITY ANGIOGRAPHY Left 01/17/2023   Procedure: Lower Extremity Angiography;  Surgeon: Algernon Huxley, MD;  Location: [place] [facility];  Service: Cardiovascular;  Laterality: Left;   LOWER EXTREMITY INTERVENTION N/A 11/19/2019   Procedure: LOWER EXTREMITY  INTERVENTION;  Surgeon: Algernon Huxley, MD;  Location: [place] [facility];  Service: Cardiovascular;  Laterality: N/A;   LOWER EXTREMITY INTERVENTION Bilateral 06/29/2021   Procedure: LOWER EXTREMITY INTERVENTION;  Surgeon: Algernon Huxley, MD;  Location: [place] [facility];  Service: Cardiovascular;  Laterality: Bilateral;   TEE WITHOUT CARDIOVERSION N/A 06/22/2017   Procedure: TRANSESOPHAGEAL ECHOCARDIOGRAM (TEE);  Surgeon: Wellington Hampshire, MD;  Location: [facility];  Service: Cardiovascular;  Laterality: N/A;   TOTAL KNEE ARTHROPLASTY Right 07/11/2018   Procedure: TOTAL KNEE ARTHROPLASTY;  Surgeon: Corky Mull, MD;  Location: [facility];  Service: Orthopedics;  Laterality: Right;   VAGINAL HYSTERECTOMY     VISCERAL ARTERY INTERVENTION N/A 06/20/2017   Procedure: Visceral Artery Intervention, possible aortic thrombectomy;  Surgeon: Algernon Huxley, MD;  Location: [place] [facility];  Service: Cardiovascular;  Laterality: N/A;   VISCERAL ARTERY INTERVENTION N/A 01/28/2018   Procedure: VISCERAL ARTERY INTERVENTION;  Surgeon: Algernon Huxley, MD;  Location: [place] [facility];  Service: Cardiovascular;  Laterality: N/A;   Home Medications  Prior to Admission medications   Medication Sig Start Date End Date Taking? Authorizing Provider  apixaban (ELIQUIS) 5 MG TABS tablet Take 1 tablet (5 mg total) by mouth 2 (two) times daily. 01/25/21  Yes Stegmayer, Janalyn Harder, PA-C  aspirin EC 81 MG tablet  Take 81 mg by mouth daily. Swallow whole.   Yes [provider]  atorvastatin (LIPITOR) 80 MG tablet Take 80 mg by mouth at bedtime.   Yes [provider]  COMBIVENT RESPIMAT 20-100 MCG/ACT AERS respimat Inhale 1 puff into the lungs every 6 (six) hours as needed. 03/27/22  Yes [provider]  esomeprazole (NEXIUM) 20 MG capsule Take 20 mg by mouth daily. 07/06/22  Yes [provider]  feeding supplement (ENSURE ENLIVE / ENSURE PLUS) LIQD Take 237 mLs by mouth 2 (two) times daily  between meals. 05/06/22  Yes Fritzi Mandes, MD  ferrous sulfate 325 (65 FE) MG tablet Take 325 mg by mouth daily.   Yes [provider]  fluconazole (DIFLUCAN) 150 MG tablet Take 150 mg by mouth every 3 (three) days. 01/08/23  Yes [provider]  isosorbide mononitrate (IMDUR) 60 MG 24 hr tablet Take 60 mg by mouth daily. 01/23/22  Yes [provider]  metoprolol tartrate (LOPRESSOR) 25 MG tablet Take 0.5 tablets (12.5 mg total) by mouth 2 (two) times daily. Hold if SBP is 100 or less or HR is less than 60. Notify Provider Patient taking differently: Take 50 mg by mouth 2 (two) times daily. Hold if SBP is 100 or less or HR is less than 60. Notify Provider 09/08/22  Yes Emeterio Reeve, DO  metroNIDAZOLE (FLAGYL) 500 MG tablet Take 1 tablet (500 mg total) by mouth 2 (two) times daily. 01/05/23  Yes Tsosie Billing, MD  pregabalin (LYRICA) 200 MG capsule Take 1 capsule (200 mg total) by mouth in the morning, at noon, and at bedtime. 10/04/22 02/01/23 Yes Kris Hartmann, NP  alum & mag hydroxide-simeth (MAALOX/MYLANTA) 200-200-20 MG/5ML suspension Take 15-30 mLs by mouth every 2 (two) hours as needed for indigestion. Patient not taking: Reported on 01/17/2023 08/11/22   Lorella Nimrod, MD  bisacodyl (DULCOLAX) 5 MG EC tablet Take 1 tablet (5 mg total) by mouth daily as needed for moderate constipation. Patient not taking: Reported on 01/17/2023 09/08/22   Emeterio Reeve, DO  citalopram (CELEXA) 40 MG tablet Take 1 tablet (40 mg total) by mouth daily. 02/04/22 09/07/22  Enzo Bi, MD  lidocaine (LIDODERM) 5 % Place 1 patch onto the skin daily. Remove & Discard patch within 12 hours or as directed by MD 08/11/22   Lorella Nimrod, MD  Omadacycline Tosylate 150 MG TABS Take 2 tablets (300 mg total) by mouth daily. 01/04/23   Tsosie Billing, MD  oxyCODONE-acetaminophen (PERCOCET/ROXICET) 5-325 MG tablet Take 1-2 tablets by mouth every 4 (four) hours as needed for severe  pain. Patient not taking: Reported on 01/17/2023 12/13/22   Sharen Hones, MD  polyethylene glycol (MIRALAX / GLYCOLAX) 17 g packet Take 17 g by mouth daily. Patient not taking: Reported on 01/17/2023 08/12/22   Lorella Nimrod, MD        PATIENT WITH VERY POOR PROGNOSIS I ANTICIPATE PROLONGED ICU LOS  RECOMMEND DNR/DNI STATUS RECOMMEND PALLIATIVE CARE CONSULTATION VERY POOR CHANCE OF MEANINGFUL RECOVERY   Critical Care Time devoted to patient care services described in this note is 65 minutes.  Critical care was necessary to treat /prevent imminent and life-threatening deterioration. Overall, patient is critically ill, prognosis is guarded.  Patient with Multiorgan failure and at high risk for cardiac arrest and death.    Corrin Parker, M.D.  [facility]  Medical Director [place] Director [facility] Cardio-Pulmonary Department

## 2023-01-18 NOTE — Consult Note (Signed)
PHARMACY CONSULT NOTE - FOLLOW UP  Pharmacy Consult for Electrolyte Monitoring and Replacement   Recent Labs: Potassium (mmol/L)  Date Value  01/18/2023 3.6  02/12/2015 2.9 (L)   Magnesium (mg/dL)  Date Value  01/18/2023 1.8   Calcium (mg/dL)  Date Value  01/18/2023 7.8 (L)   Calcium, Total (mg/dL)  Date Value  02/12/2015 9.0   Albumin (g/dL)  Date Value  01/17/2023 3.0 (L)  09/14/2020 3.9  01/19/2015 3.8   Phosphorus (mg/dL)  Date Value  01/18/2023 3.1   Sodium (mmol/L)  Date Value  01/18/2023 136  02/12/2015 140     Assessment: 62 year old female with history of recent right AKA with stump infection, afib and DVT on apixaban PTA, presented with acute left leg iscemia s/p revascularization. Pharmacy consulted to monitor and replace electrolytes while in ICU.    Goal of Therapy:  Electrolytes WNL  Plan:  No replacement currently indicated Recheck all electrolytes with AM labs  Pearla Dubonnet, PharmD Clinical Pharmacist   01/18/2023 7:35 AM

## 2023-01-18 NOTE — Progress Notes (Addendum)
Initial Nutrition Assessment  DOCUMENTATION CODES:   Obesity unspecified  INTERVENTION:   Ensure Enlive po BID, each supplement provides 350 kcal and 20 grams of protein.  MVI po daily   Vitamin C 500mg  po BID  Zinc 220mg  po daily x 14 days  Check vitamins E, A, D, C, B12, zinc and copper   Pt at high refeed risk; recommend monitor potassium, magnesium and phosphorus labs daily until stable  NUTRITION DIAGNOSIS:   Increased nutrient needs related to wound healing as evidenced by estimated needs.  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  RD unable to see patient today as pt in procedure at time of RD visit. Pt is well known to this RD from numerous previous admissions. Pt with good appetite and oral intake at baseline. Pt does drink chocolate Ensure. RD will add supplements and vitamins to help pt meet her estimated needs and to support wound healing. RD will check vitamin labs important for wound healing to r/o deficiency. RD will check B12 lab as pt s/p partial resection of her terminal ileum. Per chart, pt appears weight stable pta. RD will obtain history and exam at follow up.  Medications reviewed and include: aspirin, celexa, ferrous  sulfate, diflucan, protonix, daptomycin, LRS @75ml /hr, meropenem  Labs reviewed: K 3.6 wnl, P 3.1 wnl, Mg 1.8 wnl Hgb 10.2(L), Hct 32.6(L)  NUTRITION - FOCUSED PHYSICAL EXAM: Unable to perform at this time   Diet Order:   Diet Order             Diet Carb Modified Fluid consistency: Thin; Room service appropriate? Yes  Diet effective now                  EDUCATION NEEDS:   Not appropriate for education at this time  Skin:  Skin Assessment: Reviewed RN Assessment (incision R Leg s/p AKA, VAC, abdominal wound)  Last BM:  3/27  Height:   Ht Readings from Last 1 Encounters:  01/17/23 5\' 2"  (1.575 m)    Weight:   Wt Readings from Last 1 Encounters:  01/17/23 97.5 kg    BMI:  Body mass index is 39.32 kg/m.  Estimated Nutritional Needs:   Kcal:  1900-2200kcal/day  Protein:  95-110g/day  Fluid:  1.5-1.7L/day  Koleen Distance MS, RD, LDN Please refer to [facility] for RD and/or RD on-call/weekend/after hours pager

## 2023-01-18 NOTE — Anesthesia Preprocedure Evaluation (Addendum)
Anesthesia Evaluation  Patient identified by MRN, date of birth, ID band Patient confused  General Assessment Comment:Pt sedated on precedex. She follows commands in upper extremities. She has LUE pain.   Reviewed: Allergy & Precautions, NPO status , Patient's Chart, lab work & pertinent test results  Airway Mallampati: III  TM Distance: >3 FB Neck ROM: full    Dental  (+) Edentulous Upper, Edentulous Lower   Pulmonary shortness of breath, COPD,  COPD inhaler, former smoker    + decreased breath sounds      Cardiovascular Exercise Tolerance: Poor hypertension, Pt. on medications + angina  + CAD, + Past MI, + Peripheral Vascular Disease (previous left axillary to femoral artery bypass) and +CHF  Normal cardiovascular exam Rhythm:Regular     Neuro/Psych   Anxiety     negative neurological ROS  negative psych ROS   GI/Hepatic Neg liver ROS,GERD  Controlled,,  Endo/Other  negative endocrine ROS  Morbid obesity  Renal/GU Renal InsufficiencyRenal disease     Musculoskeletal   Abdominal  (+) + obese  Peds negative pediatric ROS (+)  Hematology  (+) Blood dyscrasia, anemia   Anesthesia Other Findings Recent right AKA with stump infection Presented with acute left leg iscemia s/p revascularization.  Past Medical History: No date: Abdominal aortic atherosclerosis ([place])     Comment:  a. 05/2017 CTA abd/pelvis: significant atherosclerotic               dzs of the infrarenal abd Ao w/ some mural thrombus. No               aneurysm or dissection. No date: Acute focal ischemia of small intestine ([place]) 06/13/2017: Acute right-sided low back pain with right-sided sciatica 07/29/2017: AKI (acute kidney injury) ([place]) No date: Anemia No date: Anginal pain ([place])     Comment:  a. 08/2012 Lexiscan MV: EF 54%, non ischemia/infarct. No date: Anxiety and depression No date: Baker's cyst of knee, right     Comment:  a. 07/2016 U/S: 4.1 x  1.4 x 2.9 cystic structure in R               poplitetal fossa. No date: Bell's palsy No date: CAD (coronary artery disease) No date: CHF (congestive heart failure) ([place])     Comment:  a.) TTE 06/20/2017: EF 60-65%, no rwma, G1DD, no source               of cardiac emboli. No date: Chronic anticoagulation     Comment:  a.) apixaban No date: Chronic idiopathic constipation No date: Chronic mesenteric ischemia (HCC) No date: Chronic pain No date: COPD (chronic obstructive pulmonary disease) (HCC) No date: Dyspnea No date: Embolus of superior mesenteric artery ([place])     Comment:  a. 05/2017 CTA Abd/pelvis: apparent thrombus or embolus               in prox SMA (70-90%); b. 05/2017 catheter directed tPA,               mechanical thrombectomy, and stenting of the SMA. 01/19/2016: Essential hypertension with goal blood pressure less than  140/90 01/19/2016: Gastroesophageal reflux disease No date: GERD (gastroesophageal reflux disease) No date: H/O colectomy 11/14/2019: History of 2019 novel coronavirus disease (COVID-19) No date: History of kidney stones No date: Hyperlipidemia No date: Hypertension No date: Morbid obesity with BMI of 40.0-44.9, adult ([place]) 06/27/2021: NSTEMI (non-ST elevated myocardial infarction) ([place])     Comment:  a.) high sensitivity troponins trended: 893 --> 1587 -->  1748 --> 868 --> 735 ng/L 06/19/2017: Occlusive mesenteric ischemia (HCC) No date: Osteoarthritis No date: PAD (peripheral artery disease) (HCC) No date: PAF (paroxysmal atrial fibrillation) (HCC)     Comment:  a.) CHA2DS2-VASc = 6 (sex, CHF, HTN, DVT x2, aortic               plaque). b.) rate/rhythm maintained on oral metoprolol               tartrate; chronically anticoagulated with standard dose               apixaban. No date: Palpitations No date: Peripheral edema 06/01/2016: Personal history of other malignant neoplasm of skin No date: Pneumonia No date:  Pre-diabetes 06/13/2017: Primary osteoarthritis of both knees 08/06/2017: SBO (small bowel obstruction) (HCC) No date: Skin cancer of nose No date: Superior mesenteric artery thrombosis (HCC) No date: Vitamin D deficiency  Past Surgical History: 04/06/2022: AMPUTATION; Right     Comment:  Procedure: AMPUTATION ABOVE KNEE;  Surgeon: Algernon Huxley, MD;  Location: [facility];  Service: Vascular;                Laterality: Right; 12/09/2019: AMPUTATION TOE; Left     Comment:  Procedure: AMPUTATION TOE MPJ left;  Surgeon: Caroline More, DPM;  Location: [facility];  Service: Podiatry;                Laterality: Left; No date: APPENDECTOMY 99991111: APPLICATION OF WOUND VAC     Comment:  Procedure: APPLICATION OF WOUND VAC;  Surgeon: Algernon Huxley, MD;  Location: [facility];  Service: Vascular;; [id number]: APPLICATION OF WOUND VAC; Right     Comment:  Procedure: APPLICATION OF WOUND VAC;  Surgeon: Algernon Huxley, MD;  Location: [facility];  Service: Vascular;                Laterality: Right; [id number]: APPLICATION OF WOUND VAC; Left     Comment:  Procedure: APPLICATION OF WOUND VAC;  Surgeon: Serafina Mitchell, MD;  Location: [facility];  Service: Vascular;                Laterality: Left;  [place] [id number]: APPLICATION OF WOUND VAC; Right     Comment:  Procedure: APPLICATION OF WOUND VAC;  Surgeon: Algernon Huxley, MD;  Location: [facility];  Service: Vascular;                Laterality: Right; 07/23/2022: AXILLARY-FEMORAL BYPASS GRAFT; Left     Comment:  Procedure: BYPASS GRAFT AXILLA-BIFEMORAL;  Surgeon:               Serafina Mitchell, MD;  Location: [facility];  Service:               Vascular;  Laterality: Left; No date: CHOLECYSTECTOMY No date: COLON SURGERY 03/23/2022: COLONOSCOPY WITH PROPOFOL; N/A     Comment:  Procedure: COLONOSCOPY WITH PROPOFOL;  Surgeon: Vicente Males,  Bailey Mech, MD;  Location: [place];   Service:               Gastroenterology;  Laterality: N/A;  rectal bleed 06/21/2020: EMBOLECTOMY OR THROMBECTOMY, WITH OR WITHOUT CATHETER;  FEMOROPOPLITEAL, AORTOILIAC ARTERY, BY LEG INCISION; N/A     Comment:  Left - Decomp Fasciotomy Leg; Ant &/Or Lat Compart Only;              Midline - Angiography, Visceral, Selective Or               Supraselective (With Or Without Flush Aortogram); Left -               Revascularize, Endovasc, Open/Percut, Iliac Artery,               Unilat, Initial Vessel; W/Translum Stent, W/Angioplasty;               Right - Revascularize, Endovasc, Open/Percut, Iliac               Artery, Ea Add`L Ipsilateral; W/Translumin Stent,               W/Angioplasty; Location [facility]; 12/01/2021: ENDARTERECTOMY FEMORAL; Left     Comment:  Procedure: ENDARTERECTOMY FEMORAL;  Surgeon: Algernon Huxley, MD;  Location: [facility];  Service: Vascular;                Laterality: Left; 12/01/2021: ENDOVASCULAR REPAIR/STENT GRAFT; Left     Comment:  Procedure: ENDOVASCULAR REPAIR/STENT GRAFT;  Surgeon:               Algernon Huxley, MD;  Location: [place] [facility];                Service: Cardiovascular;  Laterality: Left; 05/03/2022: INCISION AND DRAINAGE OF WOUND; Right     Comment:  Procedure: IRRIGATION AND DEBRIDEMENT RIGHT STUMP;                Surgeon: Algernon Huxley, MD;  Location: [facility];  Service:              Vascular;  Laterality: Right; 08/28/2022: IRRIGATION AND DEBRIDEMENT ABSCESS; Right     Comment:  Procedure: AKA IRRIGATION AND DEBRIDEMENT ABSCESS;                Surgeon: Algernon Huxley, MD;  Location: [facility];  Service:              Vascular;  Laterality: Right; 08/08/2017: LAPAROTOMY; N/A     Comment:  Procedure: EXPLORATORY LAPAROTOMY POSSIBLE BOWEL               RESECTION;  Surgeon: Jules Husbands, MD;  Location: [facility];  Service: General;  Laterality: N/A; 05/17/2021: LEFT HEART CATH AND CORONARY ANGIOGRAPHY; N/A     Comment:   Procedure: LEFT HEART CATH AND CORONARY ANGIOGRAPHY;                Surgeon: Yolonda Kida, MD;  Location: [place]              [facility];  Service: Cardiovascular;  Laterality: N/A; 11/17/2019: LOWER EXTREMITY ANGIOGRAPHY; Left     Comment:  Procedure: LOWER EXTREMITY ANGIOGRAPHY;  Surgeon: Algernon Huxley,  MD;  Location: [place] [facility];  Service:               Cardiovascular;  Laterality: Left; 12/13/2020: LOWER EXTREMITY ANGIOGRAPHY; Right     Comment:  Procedure: LOWER EXTREMITY ANGIOGRAPHY;  Surgeon: Algernon Huxley, MD;  Location: [place] [facility];  Service:               Cardiovascular;  Laterality: Right; 01/10/2021: LOWER EXTREMITY ANGIOGRAPHY; Right     Comment:  Procedure: LOWER EXTREMITY ANGIOGRAPHY;  Surgeon: Algernon Huxley, MD;  Location: [place] [facility];  Service:               Cardiovascular;  Laterality: Right; 01/24/2021: LOWER EXTREMITY ANGIOGRAPHY; Right     Comment:  Procedure: LOWER EXTREMITY ANGIOGRAPHY;  Surgeon: Algernon Huxley, MD;  Location: [place] [facility];  Service:               Cardiovascular;  Laterality: Right; 06/28/2021: LOWER EXTREMITY ANGIOGRAPHY; Bilateral     Comment:  Procedure: Lower Extremity Angiography;  Surgeon: Algernon Huxley, MD;  Location: [place] [facility];  Service:               Cardiovascular;  Laterality: Bilateral; 11/28/2021: LOWER EXTREMITY ANGIOGRAPHY; Right     Comment:  Procedure: LOWER EXTREMITY ANGIOGRAPHY;  Surgeon: Algernon Huxley, MD;  Location: [place] [facility];  Service:               Cardiovascular;  Laterality: Right; 11/30/2021: LOWER EXTREMITY ANGIOGRAPHY; Left     Comment:  Procedure: Lower Extremity Angiography;  Surgeon: Algernon Huxley, MD;  Location: [place] [facility];  Service:               Cardiovascular;  Laterality: Left; 02/22/2022: LOWER EXTREMITY ANGIOGRAPHY; Right     Comment:  Procedure: Lower  Extremity Angiography;  Surgeon: Algernon Huxley, MD;  Location: [place] [facility];  Service:               Cardiovascular;  Laterality: Right; 03/13/2022: LOWER EXTREMITY ANGIOGRAPHY; Right     Comment:  Procedure: Lower Extremity Angiography;  Surgeon: Algernon Huxley, MD;  Location: [place] [facility];  Service:               Cardiovascular;  Laterality: Right; 03/24/2022: LOWER EXTREMITY ANGIOGRAPHY; Right     Comment:  Procedure: Lower Extremity Angiography;  Surgeon: Algernon Huxley, MD;  Location: [place] [facility];  Service:               Cardiovascular;  Laterality: Right; 11/19/2019: LOWER EXTREMITY INTERVENTION; N/A     Comment:  Procedure: LOWER EXTREMITY INTERVENTION;  Surgeon: Lucky Cowboy,  Erskine Squibb, MD;  Location: [place] [facility];  Service:               Cardiovascular;  Laterality: N/A; 06/29/2021: LOWER EXTREMITY INTERVENTION; Bilateral     Comment:  Procedure: LOWER EXTREMITY INTERVENTION;  Surgeon: Algernon Huxley, MD;  Location: [place] [facility];  Service:               Cardiovascular;  Laterality: Bilateral; 06/22/2017: TEE WITHOUT CARDIOVERSION; N/A     Comment:  Procedure: TRANSESOPHAGEAL ECHOCARDIOGRAM (TEE);                Surgeon: Wellington Hampshire, MD;  Location: [facility];                Service: Cardiovascular;  Laterality: N/A; 07/11/2018: TOTAL KNEE ARTHROPLASTY; Right     Comment:  Procedure: TOTAL KNEE ARTHROPLASTY;  Surgeon: Corky Mull, MD;  Location: [facility];  Service: Orthopedics;                Laterality: Right; No date: VAGINAL HYSTERECTOMY 06/20/2017: VISCERAL ARTERY INTERVENTION; N/A     Comment:  Procedure: Visceral Artery Intervention, possible aortic              thrombectomy;  Surgeon: Algernon Huxley, MD;  Location: [place] [facility];  Service: Cardiovascular;  Laterality:               N/A; 01/28/2018: VISCERAL ARTERY INTERVENTION; N/A     Comment:   Procedure: VISCERAL ARTERY INTERVENTION;  Surgeon: Algernon Huxley, MD;  Location: [place] [facility];  Service:               Cardiovascular;  Laterality: N/A;  BMI    Body Mass Index: 40.24 kg/m      Reproductive/Obstetrics negative OB ROS                             Anesthesia Physical Anesthesia Plan  ASA: 3  Anesthesia Plan: General   Post-op Pain Management:    Induction: Intravenous  PONV Risk Score and Plan: Ondansetron, Propofol infusion and TIVA  Airway Management Planned: Natural Airway  Additional Equipment:   Intra-op Plan:   Post-operative Plan:   Informed Consent: I have reviewed the patients History and Physical, chart, labs and discussed the procedure including the risks, benefits and alternatives for the proposed anesthesia with the patient or authorized representative who has indicated his/her understanding and acceptance.     Dental Advisory Given and Consent reviewed with POA  Plan Discussed with: CRNA and Surgeon  Anesthesia Plan Comments:        Anesthesia Quick Evaluation

## 2023-01-18 NOTE — Consult Note (Addendum)
Consultation Note Date: 01/18/2023   Patient Name: Carla [name]  DOB: July 16, 1961  MRN: [id number]  Age / Sex: 62 y.o., female  PCP: Alfredo Bach, PA-C Referring Physician: Algernon Huxley, MD  Reason for Consultation: Establishing goals of care  HPI/Patient Profile: Per chart 62 year old female who returns today for follow-up wound evaluation of her right above-knee amputation.   The patient has had multiple instances of debridement.   Following this was due to concern for possible osteomyelitis but it was found that the patient had worsening of her hematoma.     Following her most recent debridement in the wound has some good areas of granulation however she still has some significant slough and some tissue noted near the distal portion.     Also concern for the patient has her left lower extremity.  She has recently noticed that there has been some bruising and a wound on her left foot.     She notes that her left leg has been giving her extensive amounts of pain and worse at night.  She has not been able to sleep during the evening.     On exam her thigh is warm but approaching her leg it becomes cool near the feet and toes.  Patient also has some light mottling.  She has an axillary femoral graft.  She had a lower extremity arterial duplex which showed dampened monophasic flow throughout the left leg there is concern today that the patient has an occlusion of her graft.  Clinical Assessment and Goals of Care: Notes and labs reviewed. Updated by staff regarding non-compliance outside of hospital and difficulty providing care here.   In to see patient. She is resting in bed, very tearful. She states she has 2/4 children living. She lives with her daughter. She states her father is living, but her mother, 2 children, and husband are deceased.   With broaching GOC, she becomes more tearful. Staff  treating anxiety actively. She states she needs to live for her grandchildren and great grandchildren. Will f/u tomorrow for further conversation.   SUMMARY OF RECOMMENDATIONS   PMT will continue to follow.   Prognosis:  Poor overall       Primary Diagnoses: Present on Admission:  Ischemic leg   I have reviewed the medical record, interviewed the patient and family, and examined the patient. The following aspects are pertinent.  Past Medical History:  Diagnosis Date   Abdominal aortic atherosclerosis ([place])    a. 05/2017 CTA abd/pelvis: significant atherosclerotic dzs of the infrarenal abd Ao w/ some mural thrombus. No aneurysm or dissection.   Acute focal ischemia of small intestine (HCC)    Acute right-sided low back pain with right-sided sciatica 06/13/2017   AKI (acute kidney injury) ([place]) 07/29/2017   Anemia    Anginal pain ([place])    a. 08/2012 Lexiscan MV: EF 54%, non ischemia/infarct.   Anxiety and depression    Baker's cyst of knee, right    a. 07/2016 U/S: 4.1 x 1.4 x 2.9  cystic structure in R poplitetal fossa.   Bell's palsy    CAD (coronary artery disease)    CHF (congestive heart failure) ([place])    a.) TTE 06/20/2017: EF 60-65%, no rwma, G1DD, no source of cardiac emboli.   Chronic anticoagulation    a.) apixaban   Chronic idiopathic constipation    Chronic mesenteric ischemia (HCC)    Chronic pain    COPD (chronic obstructive pulmonary disease) (HCC)    Dyspnea    Embolus of superior mesenteric artery ([place])    a. 05/2017 CTA Abd/pelvis: apparent thrombus or embolus in prox SMA (70-90%); b. 05/2017 catheter directed tPA, mechanical thrombectomy, and stenting of the SMA.   Essential hypertension with goal blood pressure less than 140/90 01/19/2016   Gastroesophageal reflux disease 01/19/2016   GERD (gastroesophageal reflux disease)    H/O colectomy    History of 2019 novel coronavirus disease (COVID-19) 11/14/2019   History of kidney stones    Hyperlipidemia     Hypertension    Morbid obesity with BMI of 40.0-44.9, adult ([place])    NSTEMI (non-ST elevated myocardial infarction) ([place]) 06/27/2021   a.) high sensitivity troponins trended: 893 --> 1587 --> 1748 --> 868 --> 735 ng/L   Occlusive mesenteric ischemia ([place]) 06/19/2017   Osteoarthritis    PAD (peripheral artery disease) (HCC)    PAF (paroxysmal atrial fibrillation) (HCC)    a.) CHA2DS2-VASc = 6 (sex, CHF, HTN, DVT x2, aortic plaque). b.) rate/rhythm maintained on oral metoprolol tartrate; chronically anticoagulated with standard dose apixaban.   Palpitations    Peripheral edema    Personal history of other malignant neoplasm of skin 06/01/2016   Pneumonia    Pre-diabetes    Primary osteoarthritis of both knees 06/13/2017   SBO (small bowel obstruction) ([place]) 08/06/2017   Skin cancer of nose    Superior mesenteric artery thrombosis (HCC)    Vitamin D deficiency    Social History   Socioeconomic History   Marital status: Single    Spouse name: Not on file   Number of children: Not on file   Years of education: Not on file   Highest education level: Not on file  Occupational History   Occupation: Unemployed  Tobacco Use   Smoking status: Former    Packs/day: 1    Types: Cigarettes    Quit date: 2023    Years since quitting: 1.2   Smokeless tobacco: Never  Vaping Use   Vaping Use: Never used  Substance and Sexual Activity   Alcohol use: No   Drug use: No   Sexual activity: Not on file  Other Topics Concern   Not on file  Social History Narrative   Lives in [place] with daughter   Social Determinants of Health   Financial Resource Strain: Not on file  Food Insecurity: No Food Insecurity (01/17/2023)   Hunger Vital Sign    Worried About Running Out of Food in the Last Year: Never true    Ran Out of Food in the Last Year: Never true  Transportation Needs: No Transportation Needs (01/17/2023)   PRAPARE - Hydrologist (Medical): No     Lack of Transportation (Non-Medical): No  Physical Activity: Not on file  Stress: Not on file  Social Connections: Not on file   Family History  Problem Relation Age of Onset   Hypertension Mother    Heart disease Mother    Heart attack Mother    Breast cancer Mother  Heart disease Father    Hypertension Father    Breast cancer Paternal Aunt    Scheduled Meds:  [START ON 01/19/2023] vitamin C  500 mg Oral BID   aspirin EC  81 mg Oral Daily   atorvastatin  80 mg Oral QHS   Chlorhexidine Gluconate Cloth  6 each Topical Daily   citalopram  40 mg Oral Daily   feeding supplement  237 mL Oral BID BM   ferrous sulfate  325 mg Oral Daily   [START ON 01/19/2023] fluconazole  150 mg Oral Q3 days   isosorbide mononitrate  60 mg Oral Daily   metoprolol tartrate  50 mg Oral BID   [START ON 01/19/2023] multivitamin with minerals  1 tablet Oral Daily   pantoprazole  40 mg Oral Daily   pregabalin  200 mg Oral TID   sodium chloride flush  3 mL Intravenous Q12H   [START ON 01/19/2023] zinc sulfate  220 mg Oral Daily   Continuous Infusions:  sodium chloride     DAPTOmycin (CUBICIN) 500 mg in sodium chloride 0.9 % IVPB Stopped (01/17/23 2052)   dexmedetomidine (PRECEDEX) IV infusion 0.6 mcg/kg/hr (01/18/23 1240)   lactated ringers 75 mL/hr at 01/18/23 1239   meropenem (MERREM) IV Stopped (01/18/23 0208)   tirofiban 0.15 mcg/kg/min (01/18/23 1459)   PRN Meds:.sodium chloride, ipratropium-albuterol, ondansetron (ZOFRAN) IV, mouth rinse, oxyCODONE-acetaminophen, sodium chloride flush Medications Prior to Admission:  Prior to Admission medications   Medication Sig Start Date End Date Taking? Authorizing Provider  apixaban (ELIQUIS) 5 MG TABS tablet Take 1 tablet (5 mg total) by mouth 2 (two) times daily. 01/25/21  Yes Stegmayer, Janalyn Harder, PA-C  aspirin EC 81 MG tablet Take 81 mg by mouth daily. Swallow whole.   Yes [provider]  atorvastatin (LIPITOR) 80 MG tablet Take 80 mg by mouth  at bedtime.   Yes [provider]  COMBIVENT RESPIMAT 20-100 MCG/ACT AERS respimat Inhale 1 puff into the lungs every 6 (six) hours as needed. 03/27/22  Yes [provider]  esomeprazole (NEXIUM) 20 MG capsule Take 20 mg by mouth daily. 07/06/22  Yes [provider]  feeding supplement (ENSURE ENLIVE / ENSURE PLUS) LIQD Take 237 mLs by mouth 2 (two) times daily between meals. 05/06/22  Yes Fritzi Mandes, MD  ferrous sulfate 325 (65 FE) MG tablet Take 325 mg by mouth daily.   Yes [provider]  fluconazole (DIFLUCAN) 150 MG tablet Take 150 mg by mouth every 3 (three) days. 01/08/23  Yes [provider]  isosorbide mononitrate (IMDUR) 60 MG 24 hr tablet Take 60 mg by mouth daily. 01/23/22  Yes [provider]  metoprolol tartrate (LOPRESSOR) 25 MG tablet Take 0.5 tablets (12.5 mg total) by mouth 2 (two) times daily. Hold if SBP is 100 or less or HR is less than 60. Notify Provider Patient taking differently: Take 50 mg by mouth 2 (two) times daily. Hold if SBP is 100 or less or HR is less than 60. Notify Provider 09/08/22  Yes Emeterio Reeve, DO  metroNIDAZOLE (FLAGYL) 500 MG tablet Take 1 tablet (500 mg total) by mouth 2 (two) times daily. 01/05/23  Yes Tsosie Billing, MD  pregabalin (LYRICA) 200 MG capsule Take 1 capsule (200 mg total) by mouth in the morning, at noon, and at bedtime. 10/04/22 02/01/23 Yes Kris Hartmann, NP  alum & mag hydroxide-simeth (MAALOX/MYLANTA) 200-200-20 MG/5ML suspension Take 15-30 mLs by mouth every 2 (two) hours as needed for indigestion. Patient not  taking: Reported on 01/17/2023 08/11/22   Lorella Nimrod, MD  bisacodyl (DULCOLAX) 5 MG EC tablet Take 1 tablet (5 mg total) by mouth daily as needed for moderate constipation. Patient not taking: Reported on 01/17/2023 09/08/22   Emeterio Reeve, DO  citalopram (CELEXA) 40 MG tablet Take 1 tablet (40 mg total) by mouth daily. 02/04/22 09/07/22  Enzo Bi, MD   lidocaine (LIDODERM) 5 % Place 1 patch onto the skin daily. Remove & Discard patch within 12 hours or as directed by MD 08/11/22   Lorella Nimrod, MD  Omadacycline Tosylate 150 MG TABS Take 2 tablets (300 mg total) by mouth daily. 01/04/23   Tsosie Billing, MD  oxyCODONE-acetaminophen (PERCOCET/ROXICET) 5-325 MG tablet Take 1-2 tablets by mouth every 4 (four) hours as needed for severe pain. Patient not taking: Reported on 01/17/2023 12/13/22   Sharen Hones, MD  polyethylene glycol (MIRALAX / GLYCOLAX) 17 g packet Take 17 g by mouth daily. Patient not taking: Reported on 01/17/2023 08/12/22   Lorella Nimrod, MD   Allergies  Allergen Reactions   Gabapentin     Tremors    Bactrim [Sulfamethoxazole-Trimethoprim] Itching   Review of Systems  Psychiatric/Behavioral:  The patient is nervous/anxious.     Physical Exam Pulmonary:     Effort: Pulmonary effort is normal.  Neurological:     Mental Status: She is alert.     Vital Signs: BP (!) 85/50   Pulse 65   Temp 99.9 F (37.7 C) (Oral)   Resp 17   Ht 5\' 2"  (1.575 m)   Wt 97.5 kg   SpO2 97%   BMI 39.32 kg/m  Pain Scale: 0-10 POSS *See Group Information*: 2-Acceptable,Slightly drowsy, easily aroused Pain Score: Asleep   SpO2: SpO2: 97 % O2 Device:SpO2: 97 % O2 Flow Rate: .O2 Flow Rate (L/min): 4 L/min  IO: Intake/output summary:  Intake/Output Summary (Last 24 hours) at 01/18/2023 1615 Last data filed at 01/18/2023 1200 Gross per 24 hour  Intake 3215.34 ml  Output 750 ml  Net 2465.34 ml    LBM: Last BM Date : 01/17/23 Baseline Weight: Weight: 97.5 kg Most recent weight: Weight: 97.5 kg       Signed by: Asencion Gowda, NP   Please contact Palliative Medicine Team phone at [phone] for questions and concerns.  For individual provider: See Shea Evans

## 2023-01-18 NOTE — Progress Notes (Signed)
Automatic USGPIV populated. Nurse to place consult when patient needing USGPIV for vasopressor. Fran Lowes, RN VAST

## 2023-01-19 ENCOUNTER — Other Ambulatory Visit: Payer: Self-pay

## 2023-01-19 DIAGNOSIS — I998 Other disorder of circulatory system: Secondary | ICD-10-CM | POA: Diagnosis not present

## 2023-01-19 DIAGNOSIS — Z7189 Other specified counseling: Secondary | ICD-10-CM | POA: Diagnosis not present

## 2023-01-19 LAB — BASIC METABOLIC PANEL
Anion gap: 5 (ref 5–15)
BUN: 7 mg/dL — ABNORMAL LOW (ref 8–23)
CO2: 24 mmol/L (ref 22–32)
Calcium: 8 mg/dL — ABNORMAL LOW (ref 8.9–10.3)
Chloride: 111 mmol/L (ref 98–111)
Creatinine, Ser: 0.56 mg/dL (ref 0.44–1.00)
GFR, Estimated: >60 mL/min (ref >60)
Glucose, Bld: 115 mg/dL — ABNORMAL HIGH (ref 70–99)
Potassium: 3.5 mmol/L (ref 3.5–5.1)
Sodium: 140 mmol/L (ref 135–145)

## 2023-01-19 LAB — CBC
HCT: 27.7 % — ABNORMAL LOW (ref 36.0–46.0)
Hemoglobin: 8.8 g/dL — ABNORMAL LOW (ref 12.0–15.0)
MCH: 28.1 pg (ref 26.0–34.0)
MCHC: 31.8 g/dL (ref 30.0–36.0)
MCV: 88.5 fL (ref 80.0–100.0)
Platelets: 137 10*3/uL — ABNORMAL LOW (ref 150–400)
RBC: 3.13 MIL/uL — ABNORMAL LOW (ref 3.87–5.11)
RDW: 18.2 % — ABNORMAL HIGH (ref 11.5–15.5)
WBC: 3.2 10*3/uL — ABNORMAL LOW (ref 4.0–10.5)
nRBC: 0 % (ref 0.0–0.2)

## 2023-01-19 LAB — GLUCOSE, CAPILLARY: Glucose-Capillary: 118 mg/dL — ABNORMAL HIGH (ref 70–99)

## 2023-01-19 LAB — FIBRINOGEN: Fibrinogen: 228 mg/dL (ref 210–475)

## 2023-01-19 LAB — MAGNESIUM: Magnesium: 1.6 mg/dL — ABNORMAL LOW (ref 1.7–2.4)

## 2023-01-19 LAB — PHOSPHORUS: Phosphorus: 4.2 mg/dL (ref 2.5–4.6)

## 2023-01-19 LAB — VITAMIN B12: Vitamin B-12: 222 pg/mL (ref 180–914)

## 2023-01-19 LAB — VITAMIN D 25 HYDROXY (VIT D DEFICIENCY, FRACTURES): Vit D, 25-Hydroxy: 14.43 ng/mL — ABNORMAL LOW (ref 30–100)

## 2023-01-19 MED ORDER — VITAMIN D (ERGOCALCIFEROL) 1.25 MG (50000 UNIT) PO CAPS: 50000.0000 [IU] | ORAL_CAPSULE | ORAL | Status: AC

## 2023-01-19 MED ORDER — APIXABAN 5 MG PO TABS: 5.0000 mg | ORAL_TABLET | Freq: Two times a day (BID) | ORAL | Status: DC

## 2023-01-19 MED ORDER — APIXABAN 5 MG PO TABS
10.0000 mg | ORAL_TABLET | Freq: Once | ORAL | Status: AC
  Administered 2023-01-19: 10 mg via ORAL
  Filled 2023-01-19: qty 2

## 2023-01-19 MED ORDER — OXYCODONE HCL 5 MG PO CAPS: 5.0000 mg | ORAL_CAPSULE | ORAL | 0 refills | Status: DC | PRN

## 2023-01-19 MED ORDER — MAGNESIUM SULFATE 4 GM/100ML IV SOLN
4.0000 g | Freq: Once | INTRAVENOUS | Status: AC
  Administered 2023-01-19: 4 g via INTRAVENOUS
  Filled 2023-01-19: qty 100

## 2023-01-19 MED ORDER — POTASSIUM CHLORIDE CRYS ER 20 MEQ PO TBCR
40.0000 meq | EXTENDED_RELEASE_TABLET | Freq: Once | ORAL | Status: AC
  Administered 2023-01-19: 40 meq via ORAL
  Filled 2023-01-19: qty 2

## 2023-01-19 MED ORDER — OXYCODONE HCL 5 MG PO TABS: 5.0000 mg | ORAL_TABLET | ORAL | 0 refills | Status: DC | PRN

## 2023-01-19 NOTE — Plan of Care (Signed)
Patient continues to not accept any accountability for her health and wellness. She denies that smoking and lifestyle choices are factors and causes for her health issues. Education is rejected by both patient and daughter.   Problem: Education: Goal: Knowledge of General Education information will improve Description: Including pain rating scale, medication(s)/side effects and non-pharmacologic comfort measures Outcome: Adequate for Discharge   Problem: Health Behavior/Discharge Planning: Goal: Ability to manage health-related needs will improve Outcome: Adequate for Discharge   Problem: Clinical Measurements: Goal: Ability to maintain clinical measurements within normal limits will improve Outcome: Adequate for Discharge Goal: Will remain free from infection Outcome: Adequate for Discharge Goal: Diagnostic test results will improve Outcome: Adequate for Discharge Goal: Respiratory complications will improve Outcome: Adequate for Discharge Goal: Cardiovascular complication will be avoided Outcome: Adequate for Discharge   Problem: Activity: Goal: Risk for activity intolerance will decrease Outcome: Adequate for Discharge   Problem: Nutrition: Goal: Adequate nutrition will be maintained Outcome: Adequate for Discharge   Problem: Coping: Goal: Level of anxiety will decrease Outcome: Adequate for Discharge   Problem: Elimination: Goal: Will not experience complications related to bowel motility Outcome: Adequate for Discharge Goal: Will not experience complications related to urinary retention Outcome: Adequate for Discharge   Problem: Pain Managment: Goal: General experience of comfort will improve Outcome: Adequate for Discharge   Problem: Safety: Goal: Ability to remain free from injury will improve Outcome: Adequate for Discharge   Problem: Skin Integrity: Goal: Risk for impaired skin integrity will decrease Outcome: Adequate for Discharge   Problem:  Education: Goal: Understanding of CV disease, CV risk reduction, and recovery process will improve Outcome: Adequate for Discharge Goal: Individualized Educational Video(s) Outcome: Adequate for Discharge   Problem: Activity: Goal: Ability to return to baseline activity level will improve Outcome: Adequate for Discharge   Problem: Cardiovascular: Goal: Ability to achieve and maintain adequate cardiovascular perfusion will improve Outcome: Adequate for Discharge Goal: Vascular access site(s) Level 0-1 will be maintained Outcome: Adequate for Discharge   Problem: Health Behavior/Discharge Planning: Goal: Ability to safely manage health-related needs after discharge will improve Outcome: Adequate for Discharge

## 2023-01-19 NOTE — TOC Transition Note (Addendum)
Transition of Care [facility]) - CM/SW Discharge Note   Patient Details  Name: Carla [name] MRN: [id number] Date of Birth: 1961-04-30  Transition of [facility]) CM/SW Contact:  Tiburcio Bash, LCSW Phone Number: 01/19/2023, 10:49 AM   Clinical Narrative:     Patient to discharge home today resuming [facility] PT and RN services, [name] with [facility] aware of dc today.   Patient was previously active with [facility] for home IV abx. Per NP patient will dc today on PO antibiotics. Per RN patient to discharge with wound vac she was admitted with. No additional dc needs noted. Patients daughter aware of above, reports patient has all wound vac supplies at home and the [facility] nurse assists with that. She reports no additional needs, patient's granddaughter to pick up at discharge today.   Final next level of care: [place] Barriers to Discharge: No Barriers Identified   Patient Goals and CMS Choice      Discharge Placement                         Discharge Plan and Services Additional resources added to the After Visit Summary for                            HH Arranged: PT, RN [facility] Agency: [place] ([facility]) Date HH Agency Contacted: 01/19/23 Time HH Agency Contacted: [phone]    Social Determinants of Health (SDOH) Interventions SDOH Screenings   Food Insecurity: No Food Insecurity (01/17/2023)  Housing: Low Risk  (01/17/2023)  Transportation Needs: No Transportation Needs (01/17/2023)  Utilities: Not At Risk (01/17/2023)  Depression (PHQ2-9): Low Risk  (01/04/2023)  Tobacco Use: Medium Risk (01/18/2023)     Readmission Risk Interventions    12/06/2022    1:31 PM 07/25/2022   10:49 AM 05/05/2022   10:52 AM  Readmission Risk Prevention Plan  Transportation Screening Complete Complete Complete  Medication Review Press photographer) Complete Complete Complete  PCP or Specialist appointment within 3-5 days of discharge  Complete Complete  HRI or  Home Care Consult  Complete   SW Recovery Care/Counseling Consult Complete Not Complete Complete  SW Consult Not Complete Comments  NA   Palliative Care Screening Not Applicable  Not [place]  Patient Refused Patient Refused

## 2023-01-19 NOTE — Discharge Summary (Addendum)
[facility]    Discharge Summary    Patient ID:  Carla [name] MRN: [id number] DOB/AGE: 1960-12-13 62 y.o.  Admit date: 01/17/2023 Discharge date: 01/19/2023 Date of Surgery: 01/18/2023 Surgeon: Surgeon(s): Algernon Huxley, MD  Admission Diagnosis: Ischemic leg [I99.8]  Discharge Diagnoses:  Ischemic leg [I99.8]  Secondary Diagnoses: Past Medical History:  Diagnosis Date   Abdominal aortic atherosclerosis ([place])    a. 05/2017 CTA abd/pelvis: significant atherosclerotic dzs of the infrarenal abd Ao w/ some mural thrombus. No aneurysm or dissection.   Acute focal ischemia of small intestine (HCC)    Acute right-sided low back pain with right-sided sciatica 06/13/2017   AKI (acute kidney injury) ([place]) 07/29/2017   Anemia    Anginal pain ([place])    a. 08/2012 Lexiscan MV: EF 54%, non ischemia/infarct.   Anxiety and depression    [place]'s cyst of knee, right    a. 07/2016 U/S: 4.1 x 1.4 x 2.9 cystic structure in R poplitetal fossa.   Bell's palsy    CAD (coronary artery disease)    CHF (congestive heart failure) ([place])    a.) TTE 06/20/2017: EF 60-65%, no rwma, G1DD, no source of cardiac emboli.   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 05/2017 CTA Abd/pelvis: apparent thrombus or embolus in prox SMA (70-90%); b. 05/2017 catheter directed tPA, mechanical thrombectomy, and stenting of the SMA.   Essential hypertension with goal blood pressure less than 140/90 01/19/2016   Gastroesophageal reflux disease 01/19/2016   GERD (gastroesophageal reflux disease)    H/O colectomy    History of 2019 novel coronavirus disease (COVID-19) 11/14/2019   History of kidney stones    Hyperlipidemia    Hypertension    Morbid obesity with BMI of 40.0-44.9, adult ([place])     NSTEMI (non-ST elevated myocardial infarction) ([place]) 06/27/2021   a.) high sensitivity troponins trended: 893 --> 1587 --> 1748 --> 868 --> 735 ng/L   Occlusive mesenteric ischemia ([place]) 06/19/2017   Osteoarthritis    PAD (peripheral artery disease) (HCC)    PAF (paroxysmal atrial fibrillation) (HCC)    a.) CHA2DS2-VASc = 6 (sex, CHF, HTN, DVT x2, aortic plaque). b.) rate/rhythm maintained on oral metoprolol tartrate; chronically anticoagulated with standard dose apixaban.   Palpitations    Peripheral edema    Personal history of other malignant neoplasm of skin 06/01/2016   Pneumonia    Pre-diabetes    Primary osteoarthritis of both knees 06/13/2017   SBO (small bowel obstruction) ([place]) 08/06/2017   Skin cancer of nose    Superior mesenteric artery thrombosis (HCC)    Vitamin D deficiency     Procedure(s): LOWER EXTREMITY INTERVENTION  Discharged Condition: good  HPI:  Carla [name] is a 62 year old female who presented to the vein and vascular lab with concerns of poor blood flow to her left lower extremity.  Patient taken to the lab on 01/17/2023 and found to have her axillary to femoral bypass graft to her left lower extremity thrombosed.  Patient was accessed in the left upper extremity from the radial approach and left lower extremity from the femoral artery bypass.  Lumbar lytic therapy was administered to the axillary artery to the femoral artery bypass.  Mechanical thrombectomy to the left axillary  artery to the femoral artery bypass was completed.  Angioplasty after removal and thrombosis was done as well.  Patient was placed on Aggrastat overnight.  She was taken back to the vascular lab where another angiogram was completed to open the left axillary to femoral artery bypass.  Postoperatively patient was placed on Aggrastat infusion.  Once completed patient was converted to her home dose of Eliquis.  Patient was then discharged home.  Hospital Course:  [name] is a 62 y.o.  female is S/P Left Extubated: POD # 0 Physical Exam:  Alert notes x3, no acute distress Face: Symmetrical.  Tongue is midline. Neck: Trachea is midline.  No swelling or bruising. Cardiovascular: Regular rate and rhythm Pulmonary: Clear to auscultation bilaterally Abdomen: Soft, nontender, nondistended Right radial access: Clean dry and intact.  No swelling or drainage notedwith positive erythema. Left groin access: Clean dry and intact.  No swelling or drainage noted Left lower extremity: Thigh soft.  Calf soft.  Extremities warm distally toes.  Hard to palpate pedal pulses however the foot is warm is her good capillary refill. Right lower extremity: RT AKA with wound vac to open area.  Neurological: No deficits noted   Post-op wounds:  clean, dry, intact or healing well  Pt. Ambulating, voiding and taking PO diet without difficulty. Pt pain controlled with PO pain meds.  Labs:  As below  Complications: none  Consults:    Significant Diagnostic Studies: CBC Lab Results  Component Value Date   WBC 3.2 (L) 01/19/2023   HGB 8.8 (L) 01/19/2023   HCT 27.7 (L) 01/19/2023   MCV 88.5 01/19/2023   PLT 137 (L) 01/19/2023    BMET    Component Value Date/Time   NA 140 01/19/2023 0425   NA 140 02/12/2015 1137   K 3.5 01/19/2023 0425   K 2.9 (L) 02/12/2015 1137   CL 111 01/19/2023 0425   CL 107 02/12/2015 1137   CO2 24 01/19/2023 0425   CO2 27 02/12/2015 1137   GLUCOSE 115 (H) 01/19/2023 0425   GLUCOSE 120 (H) 02/12/2015 1137   BUN 7 (L) 01/19/2023 0425   BUN 15 02/12/2015 1137   CREATININE 0.56 01/19/2023 0425   CREATININE 0.78 02/12/2015 1137   CALCIUM 8.0 (L) 01/19/2023 0425   CALCIUM 9.0 02/12/2015 1137   GFRNONAA >60 01/19/2023 0425   GFRNONAA >60 02/12/2015 1137   GFRAA >60 11/23/2019 1330   GFRAA >60 02/12/2015 1137   COAG Lab Results  Component Value Date   INR 1.1 12/04/2022   INR 1.1 12/03/2022   INR 1.3 (H) 08/26/2022     Disposition:   Discharge to :Home  Allergies as of 01/19/2023       Reactions   Gabapentin    Tremors    Bactrim [sulfamethoxazole-trimethoprim] Itching        Medication List     TAKE these medications    alum & mag hydroxide-simeth 200-200-20 MG/5ML suspension Commonly known as: MAALOX/MYLANTA Take 15-30 mLs by mouth every 2 (two) hours as needed for indigestion.   apixaban 5 MG Tabs tablet Commonly known as: ELIQUIS Take 1 tablet (5 mg total) by mouth 2 (two) times daily.   aspirin EC 81 MG tablet Take 81 mg by mouth daily. Swallow whole.   atorvastatin 80 MG tablet Commonly known as: LIPITOR Take 80 mg by mouth at bedtime.   bisacodyl 5 MG EC tablet Commonly known as: DULCOLAX Take 1 tablet (5 mg total) by mouth daily as needed  for moderate constipation.   citalopram 40 MG tablet Commonly known as: CeleXA Take 1 tablet (40 mg total) by mouth daily.   Combivent Respimat 20-100 MCG/ACT Aers respimat Generic drug: Ipratropium-Albuterol Inhale 1 puff into the lungs every 6 (six) hours as needed.   esomeprazole 20 MG capsule Commonly known as: NEXIUM Take 20 mg by mouth daily.   feeding supplement Liqd Take 237 mLs by mouth 2 (two) times daily between meals.   ferrous sulfate 325 (65 FE) MG tablet Take 325 mg by mouth daily.   fluconazole 150 MG tablet Commonly known as: DIFLUCAN Take 150 mg by mouth every 3 (three) days.   isosorbide mononitrate 60 MG 24 hr tablet Commonly known as: IMDUR Take 60 mg by mouth daily.   lidocaine 5 % Commonly known as: LIDODERM Place 1 patch onto the skin daily. Remove & Discard patch within 12 hours or as directed by MD   metoprolol tartrate 25 MG tablet Commonly known as: LOPRESSOR Take 0.5 tablets (12.5 mg total) by mouth 2 (two) times daily. Hold if SBP is 100 or less or HR is less than 60. Notify Provider What changed: how much to take   metroNIDAZOLE 500 MG tablet Commonly known as: Flagyl Take 1 tablet (500 mg total) by  mouth 2 (two) times daily.   Omadacycline Tosylate 150 MG Tabs Take 2 tablets (300 mg total) by mouth daily.   oxyCODONE 5 MG immediate release tablet Commonly known as: Roxicodone Take 1-2 tablets (5-10 mg total) by mouth every 4 (four) hours as needed for severe pain.   oxyCODONE-acetaminophen 5-325 MG tablet Commonly known as: PERCOCET/ROXICET Take 1-2 tablets by mouth every 4 (four) hours as needed for severe pain.   polyethylene glycol 17 g packet Commonly known as: MIRALAX / GLYCOLAX Take 17 g by mouth daily.   pregabalin 200 MG capsule Commonly known as: LYRICA Take 1 capsule (200 mg total) by mouth in the morning, at noon, and at bedtime.       Verbal and written Discharge instructions given to the patient. Wound care per Discharge AVS  Follow-up Information     Dew, Erskine Squibb, MD Follow up in 1 month(s).   Specialties: Vascular Surgery, Radiology, Interventional Cardiology Why: Ultrasound Axillary to Fem Bypass with left lower extremity ABI Contact information: [address] [address] [phone]                 Signed: Drema Pry, NP  01/19/2023, 10:59 AM

## 2023-01-19 NOTE — Progress Notes (Addendum)
Update: family has now decided to pick patient up, [facility] has been called and cancelled.    Per RN family refusing to pick patient up, CSW received angry vm from daughter stating she did not feel patient should be discharged today.   Family agreeable for [facility] to be called, CSW has called. No further dc needs.   Kelby Fam, [place], MSW, [place]

## 2023-01-19 NOTE — Progress Notes (Signed)
1500-removed 68mL of air during the deflating process of the patient's TR Band.   1525-was called to the patient's room from another room, notified her radial procedure site was bleeding. Another nurse reinflated to 10mL to stop the bleeding, which was successful. Patient's bed linen was changed and she was cleaned up. An H&H was drawn and resulted as stable.   During this event the daughter was on a video call with the patient. The patient was observed struggling to hold onto the phone while being cleaned, she was also moaning and complaining of pain. Upon suggesting the patient either hang up and call her daughter back later or put the cell phone down, the daughter aggressively stated her mother would not be hanging up and that she was recording everything that was happening to her mother.   The rest of the afternoon her blood pressure has fluctuated. Multiple times the patient's SBP would read low (70s-90s) with her mentation remaining the same.  She continues to be alert and oriented x4 throughout the end of the shift. She was interactive with staff and family at the bedside. She was frequently observed playing games on her phone and video calling with other family members.   The patient requested numerous times for pain medicine despite being educated on the importance of determining whether the monitor blood pressure reading was accurate or not. It was decided the best practice would be to take a manual blood pressure. Auscultating a right forearm/radial blood pressure was difficult to hear, to confirm a palpation blood pressure was measured which correlated with the auscultated blood pressure of 110/78. The daughter, who was at the bedside, supported her receiving her pain medication despite the expressed concern of her blood pressure by this RN. The daughter shared that one of the last times she was admitted, Dr. Lucky Cowboy told her the the patient had a "pain induced heart attack" and that is why in and out  of the hospital she puts a high priority on her pain management. The patient lives with her daughter who cares for her and manages her medications.  Smoking cessation was attempted with patient. Educated that smoking will not only not help her heal but make her other health issues worse. The daughter, who was now at the bedside, supported the patient in continuing the smoke because "it is the only thing she can control." When this RN walked into the room while the daughter and the granddaughter were at the bedside, there was a scent of smoke which could have been marijuana.   Education reinforcing the importance of safety, low sugar/low fat/low carb diet, blood pressure and blood sugar management, smoking cessation, and increasing physical activity was continued.

## 2023-01-19 NOTE — Anesthesia Postprocedure Evaluation (Signed)
Anesthesia Post Note  Patient: Carla [name]  Procedure(s) Performed: LOWER EXTREMITY INTERVENTION (Left)  Patient location during evaluation: ICU Anesthesia Type: General Level of consciousness: awake Pain management: pain level controlled Vital Signs Assessment: post-procedure vital signs reviewed and stable Respiratory status: patient remains intubated per anesthesia plan Cardiovascular status: stable Postop Assessment: no apparent nausea or vomiting Anesthetic complications: no   Encounter Notable Events  Notable Event Outcome Phase Comment  None  Intraprocedure      Last Vitals:  Vitals:   01/19/23 0630 01/19/23 0645  BP: (!) 114/53 (!) 118/52  Pulse: (!) 59 (!) 59  Resp: 20 19  Temp:    SpO2: 95% 95%    Last Pain:  Vitals:   01/19/23 0015  TempSrc:   PainSc: Asleep                 Sonny Poth,  Clearnce Sorrel

## 2023-01-19 NOTE — Progress Notes (Signed)
NAME:  Carla [name], MRN:  [id number], DOB:  Apr 19, 1961, LOS: 2 ADMISSION DATE:  01/17/2023  CHIEF COMPLAINT:  Acute limb ischemia    History of Present Illness:  62 year old female  with extensive PMH including CAD, CHF, chronic mesenteric ischemia, Chronic Pain, COPD, HTN, GERD, HLD, P. A.Fib, S/P Right AKA complicated with wound requiring VAC currently. Presents to Vascular surgery with Dr Lucky Cowboy for for follow-up wound evaluation of her right above-knee amputation. History of multiple debridements. Concern for possible osteomyelitis but it was found that the patient had worsening of her hematoma.  Has an axillary femoral graft.  She had a lower extremity arterial duplex which showed dampened monophasic flow throughout the left leg with concern of an occlusion of her graft.   Decision made to undergo angiography of left lower extremity with hope of limb salvage. Intra-procedure with Left Upper Extremity: This demonstrated occlusion of the axillary to femoral artery bypass at its origin.  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  The brachial artery was not occluded by the sheath.  There was flow through both the radial and ulnar arteries to the hand. Left Lower Extremity:  This demonstrated lesion of the axillary to femoral artery bypass with a large amount of thrombus.  There is thrombus in the common femoral artery.  Both the profunda femoris and superficial femoral arteries reconstituted and appeared to have flow beyond the occlusion. Catheter directed thrombolytic therapy with a total of 12 mg tPA placed to left axillary to femoral artery bypass. Underwent mechanical thrombectomy to the left axillary artery to femoral artery bypass as well as mechanical thrombectomy to the left common femoral artery and proximal superficial femoral artery. Placement of an infusion catheter for continuous thrombolytic therapy to the left axillary artery to femoral artery bypass.   Post-operatively taken to  ICU on Precedex gtt. Critical Care consulted.    Significant Hospital Events: Including procedures, antibiotic start and stop dates in addition to other pertinent events   3/27 Admitted to ICU for acute limb ischemia s/p lysis therapy 3/28 s/p Mechanical thrombectomy to the left axillofemoral bypass graft had bleeding from insertion site  Micro Data:  Cultures grew, ESBL E. coli, MRSA and enteroccus. patient was on IV Dapto + Meropenem during last admission and disharged with IV Abx until 01/21/23   Antimicrobials:   Antibiotics Given (last 72 hours)     Date/Time Action Medication Dose Rate   01/17/23 1318 New Bag/Given   ceFAZolin (ANCEF) IVPB 2g/100 mL premix 2 g 200 mL/hr   01/17/23 1755 New Bag/Given   meropenem (MERREM) 1 g in sodium chloride 0.9 % 100 mL IVPB 1 g 200 mL/hr   01/17/23 2022 New Bag/Given   DAPTOmycin (CUBICIN) 500 mg in sodium chloride 0.9 % IVPB 500 mg 120 mL/hr   01/18/23 0138 New Bag/Given   meropenem (MERREM) 1 g in sodium chloride 0.9 % 100 mL IVPB 1 g 200 mL/hr   01/18/23 1842 New Bag/Given   meropenem (MERREM) 1 g in sodium chloride 0.9 % 100 mL IVPB 1 g 200 mL/hr   01/18/23 2017 New Bag/Given   DAPTOmycin (CUBICIN) 500 mg in sodium chloride 0.9 % IVPB 500 mg 120 mL/hr   01/19/23 0229 New Bag/Given   meropenem (MERREM) 1 g in sodium chloride 0.9 % 100 mL IVPB 1 g 200 mL/hr       Interim History / Subjective:  This AM patient alert, oriented, following commands, on Precedex 0.6. Overnight with some bleeding noted  at access site to left radial, aggrastat gtt stopped. Plan to transition back to home DOAC this AM per Vascular.   Objective   Blood pressure (!) 118/52, pulse (!) 59, temperature 98.9 F (37.2 C), temperature source Oral, resp. rate 19, height 5\' 2"  (1.575 m), weight 97.5 kg, SpO2 95 %.        Intake/Output Summary (Last 24 hours) at 01/19/2023 0758 Last data filed at 01/19/2023 0559 Gross per 24 hour  Intake 3103.46 ml  Output 950 ml   Net 2153.46 ml   Filed Weights   01/17/23 1156  Weight: 97.5 kg   PHYSICAL EXAMINATION:  GENERAL: Chronically ill appearing adult female, lying in bed  EYES: Pupils equal, round, reactive to light.  No scleral icterus.  PULMONARY: Clear breath sounds, no use of accessory muscles  CARDIOVASCULAR:  Brady, HR 58, no mRG GASTROINTESTINAL: Soft, nontender, -distended, active bowel sounds  NEUROLOGIC: Alert, follows commands, oriented  EXTREM: Wound vac noted to right AKA, left pedal with palpable pulses, left radial access site >> swollen, tender, good cap refill, denies decreased sensation.   Labs/imaging that I havepersonally reviewed  (right click and "Reselect all SmartList Selections" daily)   SEVERE CRITICAL LIMB ISCHEMIA Left with occluded graft s/p thrombectomy, tPA S/P Right AKA  Plan - Per Vascular  - Loading with 10 mg Eliquis this AM and then continuing 5 mg BID  Sedation Needs Post-Op - D/C Precedex  - Vascular aware of access site assessment as above.  Chronic Pain  Plan  - Continue home Percocet PRN q4h  - Continue home Lyrica   Right Stump Infection  - Wound Culture + ESBL E.Coli, Enterococcus, MRSA - Completed Ertapenem/Daptomycin for 6 weeks. End date 3/31  H/O Osteomyelitis   Plan - Currently on Daptomycin, Meropenem - On discharge resumed home flaygl, omadacycline   CAD CHF HTN HLD  Plan - Continue home Statin, ASA  - Continue home Lopressor, Imdur    Best practice (right click and "Reselect all SmartList Selections" daily)   Diet: Regular  DVT prophylaxis: DOAC as above  Mobility:  bed rest  Code Status:  FULL Disposition: Plan to d/c home hopefully today.   Time Spent: 32 minutes  Hayden Pedro, AGACNP-BC [facility]  PCCM Pgr: [phone]

## 2023-01-19 NOTE — Progress Notes (Signed)
Vascular lab request made for sheath removal prior to discharge

## 2023-01-19 NOTE — Progress Notes (Signed)
Daughter called this RN expressing her dissatisfaction over her mother being discharged today. She stated "she has the right to dispute her discharge" as she does not agree that her mother should be released so early following the procedure.  Secure chat with Dr. Lucky Cowboy and Warden Fillers NP, they shared the decision was made that she is medically cleared for discharge. Her prognosis is that she will clot again due to noncompliance and lifestyle choices and she will never not be in pain while alive due to her disease process.   This RN has attempted to education both the daughter and the patient that her noncompliance has a direct impact on the reoccurrence of thrombosis and the pace of the disease process. The patient states she does not understand because smoking did not cause her issues and she is certain of this. Both daughter and patient refuse to accept education and instructions on care plan and management.   Throughout these conversations with the patient and her daughter, aggressive and abusive language was frequently being used toward the patient. The patient was asked multiple times whether she felt safe at home or if she felt like she was taken advantage of. She tearfully stated no she does not feel either is happening and her daughter is only mean to her when she isn't feeling well. Supposedly, the daughter was in the ED at [facility] at some point during these conversations.   When the granddaughter arrived to pick up the patient, her and her significant other smelled strongly of marijuana. This RN observed the granddaughter speaking to the patient in a way that appeared to be inpatient and frustrated with her being tearful regarding her circumstances as well as the patient being tearful regarding her daughter being in the hospital.

## 2023-01-19 NOTE — Progress Notes (Signed)
Daily Progress Note   Patient Name: Carla [name]       Date: 01/19/2023 DOB: [birth year]  Age: 62 y.o. MRN#: [id number] Attending Physician: Algernon Huxley, MD Primary Care Physician: Robert Bellow Admit Date: 01/17/2023  Reason for Consultation/Follow-up: Establishing goals of care  Subjective: Notes and labs reviewed.  In to see patient.  Patient states she is planning on going home with her granddaughter today on discharge.  She discusses living with her daughter at baseline, and not wanting to live with anyone, or anywhere outside of with her.  She discusses knowing what lifestyle modifications are recommended to help prevent further clotting.  She states she wants to do what she can to stay on this earth as long as possible.  She states she is aware that comfort focused care is an option should she ever decide that route.   Length of Stay: 2  Current Medications: Scheduled Meds:   apixaban  5 mg Oral BID   vitamin C  500 mg Oral BID   aspirin EC  81 mg Oral Daily   atorvastatin  80 mg Oral QHS   Chlorhexidine Gluconate Cloth  6 each Topical Daily   citalopram  40 mg Oral Daily   feeding supplement  237 mL Oral BID BM   ferrous sulfate  325 mg Oral Daily   isosorbide mononitrate  60 mg Oral Daily   metoprolol tartrate  50 mg Oral BID   multivitamin with minerals  1 tablet Oral Daily   pantoprazole  40 mg Oral Daily   pregabalin  200 mg Oral TID   sodium chloride flush  3 mL Intravenous Q12H   zinc sulfate  220 mg Oral Daily    Continuous Infusions:  sodium chloride     DAPTOmycin (CUBICIN) 500 mg in sodium chloride 0.9 % IVPB Stopped (01/18/23 2047)   meropenem (MERREM) IV Stopped (01/19/23 1030)    PRN Meds: sodium chloride, ipratropium-albuterol, ondansetron  (ZOFRAN) IV, mouth rinse, oxyCODONE-acetaminophen, sodium chloride flush  Physical Exam Pulmonary:     Effort: Pulmonary effort is normal.  Neurological:     Mental Status: She is alert.             Vital Signs: BP 97/65   Pulse 66   Temp 98.9 F (37.2 C) (Oral)  Resp 16   Ht 5\' 2"  (1.575 m)   Wt 97.5 kg   SpO2 97%   BMI 39.32 kg/m  SpO2: SpO2: 97 % O2 Device: O2 Device: Room Air O2 Flow Rate: O2 Flow Rate (L/min): 4 L/min  Intake/output summary:  Intake/Output Summary (Last 24 hours) at 01/19/2023 1445 Last data filed at 01/19/2023 1200 Gross per 24 hour  Intake 2477.78 ml  Output 400 ml  Net 2077.78 ml   LBM: Last BM Date : 01/19/23 Baseline Weight: Weight: 97.5 kg Most recent weight: Weight: 97.5 kg        Patient Active Problem List   Diagnosis Date Noted   Ischemic leg 01/17/2023   Hyponatremia 12/12/2022   Amputation stump infection ([place]) 12/08/2022   Acute osteomyelitis of right thigh ([place]) 12/03/2022   Neuropathy 12/03/2022   Diarrhea 12/03/2022   Leg abscess 08/31/2022   Anxiety [id number]   Chronic systolic CHF (congestive heart failure) ([place]) 08/30/2022   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 ([place]) 08/29/2022   Pressure injury of skin 08/28/2022   Right leg pain 08/26/2022   Hyperlipidemia, unspecified 08/26/2022   Iron deficiency anemia 08/26/2022   Acute blood loss anemia 08/09/2022   Hematoma 07/26/2022   Hematoma of right thigh 07/24/2022   Acute respiratory failure with hypoxia ([place]) 05/02/2022   Lactic acidosis 05/02/2022   Hypoalbuminemia due to protein-calorie malnutrition ([place]) 05/02/2022   Atherosclerosis of artery of extremity with rest pain ([place]) 04/06/2022   Rectal bleeding 03/21/2022   Hypokalemia 02/03/2022   Chronic anticoagulation    Anxiety and depression    Palpitations    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[place]) 12/03/2021   Critical limb ischemia of left lower extremity ([place]) 11/30/2021   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   Arterial occlusion 11/11/2021   CAD (coronary artery disease) 07/07/2021   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[place]) 06/27/2021   Ischemic pain of right foot 06/27/2021   PVD (peripheral vascular disease) ([place]) 05/03/2021   Tobacco abuse counseling 10/26/2020   Smoker 10/26/2020   Chronic diastolic CHF (congestive heart failure) ([place]) 10/06/2020   Chronic pain 12/01/2019   Primary osteoarthritis involving multiple joints 12/01/2019   Carpal tunnel syndrome, right 11/10/2019   Health care maintenance 03/26/2019   Disorder of skeletal system 03/26/2019   Problems influencing health status 03/26/2019   Chronic low back pain (Secondary area of Pain) (Bilateral) w/o sciatica 03/26/2019   Aortic atherosclerosis ([place]) 09/12/2018   COPD (chronic obstructive pulmonary disease) ([place]) 09/12/2018   Moderate episode of recurrent major depressive disorder ([place]) 09/12/2018   Lumbar spondylosis 09/06/2018   Ventral hernia with obstruction and without gangrene 07/02/2018   Paroxysmal atrial fibrillation ([place]) 06/15/2018   Acute encephalopathy 04/08/2018   DDD (degenerative disc disease), lumbosacral 09/25/2017   Osteoarthritis of knee 09/25/2017   Adjustment disorder with anxiety 08/15/2017   Protein-calorie malnutrition, severe 08/08/2017   Chronic mesenteric ischemia (HCC)    AKI (acute kidney injury) ([place]) 07/29/2017   Hypotension 07/29/2017   Embolus of superior mesenteric artery (HCC)    Abdominal pain    Occlusive mesenteric ischemia ([place]) 06/19/2017   Acute focal ischemia of small intestine ([place])    Acute right-sided low back pain with right-sided sciatica 06/13/2017   Osteoarthritis of knee (Bilateral) 06/13/2017   Chronic pain syndrome 09/13/2016   Obesity, Class III, BMI 40-49.9 (morbid obesity) ([place]) 07/16/2016   Personal history of other malignant neoplasm of skin 06/01/2016   Chronic knee pain (Primary Area of Pain) (Bilateral) 01/19/2016  Essential  hypertension 01/19/2016   GERD (gastroesophageal reflux disease) 01/19/2016   Mixed hyperlipidemia 01/19/2016   Prediabetes 01/19/2016   Recurrent major depressive disorder ([place]) 01/19/2016   Chronic pelvic pain in female 11/14/2013   Mixed stress and urge urinary incontinence 11/14/2013   Chest pain 08/06/2012    Palliative Care Assessment & Plan    Recommendations/Plan: Patient discharging today.  Code Status:    Code Status Orders  (From admission, onward)           Start     Ordered   01/17/23 1546  Full code  (Code Status)  Continuous       Question:  By:  Answer:  Consent: discussion documented in EHR   01/17/23 1545           Code Status History     Date Active Date Inactive Code Status Order ID Comments User Context   12/03/2022 0814 12/13/2022 1708 Full Code [id number]  Loletha Grayer, MD ED   09/07/2022 2326 09/08/2022 2210 Full Code [id number]  Athena Masse, MD ED   08/26/2022 1112 08/31/2022 1906 Full Code [id number]  Ivor Costa, MD ED   07/22/2022 0302 08/12/2022 2055 Full Code [id number]  Athena Masse, MD ED   05/02/2022 1530 05/06/2022 1955 Full Code [id number]  Bernadette Hoit, DO Inpatient   04/06/2022 1806 04/12/2022 1941 Full Code [id number]  Algernon Huxley, MD Inpatient   03/21/2022 1720 03/25/2022 0103 Full Code [id number]  Collier Bullock, MD Inpatient   02/03/2022 2253 02/04/2022 1634 Full Code [id number]  Athena Masse, MD ED   11/30/2021 1445 12/03/2021 2122 Full Code [id number]  Schnier, Dolores Lory, MD Inpatient   06/27/2021 0451 07/01/2021 1846 Full Code [id number]  Vianne Bulls, MD ED   05/17/2021 1335 05/17/2021 2114 Full Code [id number]  Yolonda Kida, MD Inpatient   01/24/2021 1657 01/27/2021 1849 Full Code [id number]  Sela Hua, PA-C Inpatient   01/10/2021 1331 01/11/2021 0004 Full Code [id number]  Algernon Huxley, MD Inpatient   12/13/2020 1057 12/13/2020 1748 Full Code [id number]  Algernon Huxley, MD Inpatient   11/17/2019 1103 11/17/2019 1103 Full Code  [id number]  Sela Hua, PA-C Inpatient   11/17/2019 1103 11/17/2019 1103 Full Code [id number]  Sela Hua, PA-C Inpatient   02/03/2019 0839 02/04/2019 1445 Full Code [id number]  Nicholes Mango, MD ED   07/11/2018 1147 07/15/2018 1456 Full Code [id number]  Corky Mull, MD Inpatient   06/15/2018 0142 06/15/2018 1632 Full Code [id number]  Lance Coon, MD Inpatient   04/08/2018 0117 04/09/2018 1913 Full Code [id number]  Lance Coon, MD Inpatient   01/24/2018 2305 01/31/2018 1915 Full Code [id number]  Lance Coon, MD Inpatient   08/06/2017 2022 08/17/2017 2158 Full Code [id number]  Florene Glen, MD ED   07/29/2017 1600 08/02/2017 1814 Full Code [id number]  Gorden Harms, MD Inpatient   06/19/2017 2019 06/28/2017 1638 Full Code [id number]  Vickie Epley, MD ED       Prognosis: Poor overall    Thank you for allowing the Palliative Medicine Team to assist in the care of this patient.   Asencion Gowda, NP  Please contact Palliative Medicine Team phone at [phone] for questions and concerns.

## 2023-01-22 ENCOUNTER — Other Ambulatory Visit (HOSPITAL_COMMUNITY): Payer: Self-pay

## 2023-01-22 ENCOUNTER — Telehealth: Payer: Self-pay

## 2023-01-22 ENCOUNTER — Other Ambulatory Visit: Payer: Self-pay | Admitting: Infectious Diseases

## 2023-01-22 LAB — ZINC: Zinc: 56 ug/dL (ref 44–115)

## 2023-01-22 LAB — COPPER, SERUM: Copper: 155 ug/dL (ref 80–158)

## 2023-01-22 NOTE — Telephone Encounter (Signed)
Patient's daughter called, states they never received a call from the pharmacy regarding omadacycline and Seresa missed her dose yesterday and today, she would like to make Dr. Delaine Lame aware of this.   Provided [name] with phone number for [facility] to provide delivery address.   Beryle Flock, RN

## 2023-01-22 NOTE — Telephone Encounter (Signed)
Spoke with patient's daughter, [name], and relayed that patient was supposed to have finished antibiotics yesterday 3/31. She verbalized understanding. Sent link to her phone to set up MyChart and requested she send a picture of the wound as Dr. Delaine Lame is waiting to hear back from the vascular surgeon.   Beryle Flock, RN

## 2023-01-23 LAB — VITAMIN E
Vitamin E (Alpha Tocopherol): 11.4 mg/L (ref 9.0–29.0)
Vitamin E(Gamma Tocopherol): 2 mg/L (ref 0.5–4.9)

## 2023-01-23 LAB — VITAMIN A: Vitamin A (Retinoic Acid): 36.3 ug/dL (ref 22.0–69.5)

## 2023-01-24 ENCOUNTER — Other Ambulatory Visit (HOSPITAL_COMMUNITY): Payer: Self-pay

## 2023-01-24 MED ORDER — NUZYRA 150 MG PO TABS
300.0000 mg | ORAL_TABLET | Freq: Every day | ORAL | 0 refills | Status: DC
  Filled 2023-01-24 – 2023-02-01 (×2): qty 28, 14d supply, fill #0

## 2023-01-25 ENCOUNTER — Telehealth (INDEPENDENT_AMBULATORY_CARE_PROVIDER_SITE_OTHER): Payer: Self-pay

## 2023-01-25 ENCOUNTER — Ambulatory Visit: Payer: 59 | Attending: Infectious Diseases | Admitting: Infectious Diseases

## 2023-01-25 ENCOUNTER — Other Ambulatory Visit (INDEPENDENT_AMBULATORY_CARE_PROVIDER_SITE_OTHER): Payer: Self-pay | Admitting: Nurse Practitioner

## 2023-01-25 ENCOUNTER — Encounter (INDEPENDENT_AMBULATORY_CARE_PROVIDER_SITE_OTHER): Payer: Self-pay | Admitting: Nurse Practitioner

## 2023-01-25 ENCOUNTER — Ambulatory Visit (INDEPENDENT_AMBULATORY_CARE_PROVIDER_SITE_OTHER): Payer: 59 | Admitting: Nurse Practitioner

## 2023-01-25 ENCOUNTER — Encounter: Payer: Self-pay | Admitting: Infectious Diseases

## 2023-01-25 VITALS — BP 137/79 | HR 85 | Temp 97.9°F

## 2023-01-25 VITALS — BP 159/85 | HR 73 | Resp 16

## 2023-01-25 DIAGNOSIS — I480 Paroxysmal atrial fibrillation: Secondary | ICD-10-CM | POA: Diagnosis not present

## 2023-01-25 DIAGNOSIS — B9562 Methicillin resistant Staphylococcus aureus infection as the cause of diseases classified elsewhere: Secondary | ICD-10-CM | POA: Insufficient documentation

## 2023-01-25 DIAGNOSIS — X58XXXA Exposure to other specified factors, initial encounter: Secondary | ICD-10-CM | POA: Insufficient documentation

## 2023-01-25 DIAGNOSIS — D649 Anemia, unspecified: Secondary | ICD-10-CM | POA: Diagnosis not present

## 2023-01-25 DIAGNOSIS — I70222 Atherosclerosis of native arteries of extremities with rest pain, left leg: Secondary | ICD-10-CM

## 2023-01-25 DIAGNOSIS — Z89611 Acquired absence of right leg above knee: Secondary | ICD-10-CM

## 2023-01-25 DIAGNOSIS — E785 Hyperlipidemia, unspecified: Secondary | ICD-10-CM | POA: Diagnosis not present

## 2023-01-25 DIAGNOSIS — R21 Rash and other nonspecific skin eruption: Secondary | ICD-10-CM | POA: Insufficient documentation

## 2023-01-25 DIAGNOSIS — T8743 Infection of amputation stump, right lower extremity: Secondary | ICD-10-CM | POA: Diagnosis present

## 2023-01-25 DIAGNOSIS — T8740 Infection of amputation stump, unspecified extremity: Secondary | ICD-10-CM

## 2023-01-25 DIAGNOSIS — Z79899 Other long term (current) drug therapy: Secondary | ICD-10-CM | POA: Diagnosis not present

## 2023-01-25 NOTE — Patient Instructions (Signed)
Today you are here for the rt stump wound- you have completed antibiotics on 01/20/23- the wound looks so much better except the lower edge where there eis some slough- that needs some debridement. Will communicate with your vascular team At this time will hold off on any more antibiotics

## 2023-01-25 NOTE — Telephone Encounter (Signed)
Tiffany called from [place], she called stating her nurse called trying to schedule a time to change Nicha's wound vac 3/29 for 3/31 on Nik[facility] and her daughters phone and no one answered. She talked to the daughter Monday and the said she changed it herself because no one wanted to come out there and change her moms wound vac. The nurse tried to set a time with her for Wednesday and the daughter told her she didn't need to come because she has an appointment here at [facility] today 01/25/23 and we would change it. Tiffany said they only come out to do wound vacs on Monday, Wednesday, Friday and Sundays, but since she's getting it done today they would come out Sunday and the daughter said no she would do it.  Tiffany said that if she continues to deny care they would have to release her and she would have to find a new facility, also it's almost time for her to do another contract.

## 2023-01-25 NOTE — Progress Notes (Signed)
NAME: Carla [name]  DOB: [birth year]  MRN: [id number]  Date/Time: 01/25/2023 12:09 PM   Subjective:  Patient here with daughter. Follow-up for  for right AKA stump infection Since her last visit to see me she has been in hospital  3/27-3/29 for acute ischemia of the left foot and underwent  Left upper extremity angiogram, angiogram of the axillofemoral bypass, and the proximal left lower             2.  Mechanical thrombectomy to the left axillofemoral bypass graft with the penumbra CAT 6 device             3.  Stent placement to the proximal portion of the left axillofemoral bypass graft with 6 mm diameter by 7.5 cm length Viabahn stent             4.  Stent placement to the left subclavian artery with 7 mm diameter by 26 mm length Lifestream stent ?  She is doing better  Also the rt stump wound appears to be better from pictures- one area of slough Carla [name] is a 62 y.o. 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  She also had fever.  She was paralyzed to [facility] on 12/03/2022.  The CT scan of the femur showed a tracking abscess and osteomyelitis.  Culture was sent from the wound and it had ESBL E. coli and Enterococcus and MRSA.[name]  She underwent revision of the amputation on 12/07/2022.  [facility]  [facility]/2022 with IV ertapenem and daptomycin for total of 6 weeks , to be given until 01/21/2023.  [facility]23.   her PICC line had invertedly come out for 32 cm on 01/03/23 and it was removed  by the home nurse.  I saw her on 01/04/23 and gave her omadacycline a broad spectrum antibiotic to cover ESBL ecoli and MRSA and enterococcus .( Samples X 2 weeks). She would have finished it on 3/30. In between she was in [facility] and got IV meropenem and dapto for 3 days and then completed omadacylcine on 3/30 at home  She has no fever Some pain at the  wound vac site due to the suction Past Medical History:  Diagnosis Date   Abdominal aortic atherosclerosis    a. 05/2017 CTA abd/pelvis: significant atherosclerotic dzs of the infrarenal abd Ao w/ some mural thrombus. No aneurysm or dissection.   Acute focal ischemia of small intestine    Acute right-sided low back pain with right-sided sciatica 06/13/2017   AKI (acute kidney injury) 07/29/2017   Anemia    Anginal pain    a. 08/2012 Lexiscan MV: EF 54%, non ischemia/infarct.   Anxiety and depression    Baker's cyst of knee, right    a. 07/2016 U/S: 4.1 x 1.4 x 2.9 cystic structure in R poplitetal fossa.   Bell's palsy    CAD (coronary artery disease)    CHF (congestive heart failure)    a.) TTE 06/20/2017: EF 60-65%, no rwma, G1DD, no source of cardiac emboli.   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  05/2017 CTA Abd/pelvis: apparent thrombus or embolus in prox SMA (70-90%); b. 05/2017 catheter directed tPA, mechanical thrombectomy, and stenting of the SMA.   Essential hypertension with goal blood pressure less than 140/90 01/19/2016   Gastroesophageal reflux disease 01/19/2016   GERD (gastroesophageal reflux disease)    H/O colectomy    History of 2019 novel coronavirus disease (COVID-19) 11/14/2019   History of kidney stones    Hyperlipidemia    Hypertension    Morbid obesity with BMI of 40.0-44.9, adult    NSTEMI (non-ST elevated myocardial infarction) 06/27/2021   a.) high sensitivity troponins trended: 893 --> 1587 --> 1748 --> 868 --> 735 ng/L   Occlusive mesenteric ischemia 06/19/2017   Osteoarthritis    PAD (peripheral artery disease)    PAF (paroxysmal atrial fibrillation)    a.) CHA2DS2-VASc = 6 (sex, CHF, HTN, DVT x2, aortic plaque). b.) rate/rhythm maintained on oral metoprolol  tartrate; chronically anticoagulated with standard dose apixaban.   Palpitations    Peripheral edema    Personal history of other malignant neoplasm of skin 06/01/2016   Pneumonia    Pre-diabetes    Primary osteoarthritis of both knees 06/13/2017   SBO (small bowel obstruction) 08/06/2017   Skin cancer of nose    Superior mesenteric artery thrombosis    Vitamin D deficiency     Past Surgical History:  Procedure Laterality Date   AMPUTATION Right 04/06/2022   Procedure: AMPUTATION ABOVE KNEE;  Surgeon: Algernon Huxley, MD;  Location: [facility];  Service: Vascular;  Laterality: Right;   AMPUTATION  12/07/2022   Procedure: AMPUTATION ABOVE KNEE REVISION WITH RESECTION OF FEMUR;  Surgeon: Algernon Huxley, MD;  Location: [facility];  Service: Vascular;;   AMPUTATION TOE Left 12/09/2019   Procedure: AMPUTATION TOE MPJ left;  Surgeon: Caroline More, DPM;  Location: [facility];  Service: Podiatry;  Laterality: Left;   APPENDECTOMY     APPLICATION OF WOUND VAC  12/01/2021   Procedure: APPLICATION OF WOUND VAC;  Surgeon: Algernon Huxley, MD;  Location: [facility];  Service: Vascular;;   APPLICATION OF WOUND VAC Right 05/03/2022   Procedure: APPLICATION OF WOUND VAC;  Surgeon: Algernon Huxley, MD;  Location: [facility];  Service: Vascular;  Laterality: Right;   APPLICATION OF WOUND VAC Left 07/23/2022   Procedure: APPLICATION OF WOUND VAC;  Surgeon: Serafina Mitchell, MD;  Location: [facility];  Service: Vascular;  Laterality: Left;  Prevena   APPLICATION OF WOUND VAC Right 08/28/2022   Procedure: APPLICATION OF WOUND VAC;  Surgeon: Algernon Huxley, MD;  Location: [facility];  Service: Vascular;  Laterality: Right;   APPLICATION OF WOUND VAC  12/07/2022   Procedure: APPLICATION OF WOUND VAC;  Surgeon: Algernon Huxley, MD;  Location: [facility];  Service: Vascular;;   AXILLARY-FEMORAL BYPASS GRAFT Left 07/23/2022   Procedure: BYPASS GRAFT AXILLA-BIFEMORAL;  Surgeon: Serafina Mitchell, MD;  Location: [facility];  Service: Vascular;   Laterality: Left;   CHOLECYSTECTOMY     COLON SURGERY     COLONOSCOPY WITH PROPOFOL N/A 03/23/2022   Procedure: COLONOSCOPY WITH PROPOFOL;  Surgeon: Jonathon Bellows, MD;  Location: [facility];  Service: Gastroenterology;  Laterality: N/A;  rectal bleed   EMBOLECTOMY OR THROMBECTOMY, WITH OR WITHOUT CATHETER; FEMOROPOPLITEAL, AORTOILIAC ARTERY, BY LEG INCISION N/A 06/21/2020   Left - Decomp Fasciotomy Leg; Ant &/Or Lat Compart Only; Midline - Angiography, Visceral, Selective Or Supraselective (With Or Without Flush Aortogram); Left - Revascularize, Endovasc, Open/Percut, Iliac Artery, Unilat, Initial Vessel;  W/Translum Stent, W/Angioplasty; Right - Revascularize, Endovasc, Open/Percut, Iliac Artery, Ea Add`L Ipsilateral; W/Translumin Stent, W/Angioplasty; Location [facility];   ENDARTERECTOMY FEMORAL Left 12/01/2021   Procedure: ENDARTERECTOMY FEMORAL;  Surgeon: Algernon Huxley, MD;  Location: [facility];  Service: Vascular;  Laterality: Left;   ENDOVASCULAR REPAIR/STENT GRAFT Left 12/01/2021   Procedure: ENDOVASCULAR REPAIR/STENT GRAFT;  Surgeon: Algernon Huxley, MD;  Location: [place] [facility];  Service: Cardiovascular;  Laterality: Left;   INCISION AND DRAINAGE OF WOUND Right 05/03/2022   Procedure: IRRIGATION AND DEBRIDEMENT RIGHT STUMP;  Surgeon: Algernon Huxley, MD;  Location: [facility];  Service: Vascular;  Laterality: Right;   IRRIGATION AND DEBRIDEMENT ABSCESS Right 08/28/2022   Procedure: AKA IRRIGATION AND DEBRIDEMENT ABSCESS;  Surgeon: Algernon Huxley, MD;  Location: [facility];  Service: Vascular;  Laterality: Right;   LAPAROTOMY N/A 08/08/2017   Procedure: EXPLORATORY LAPAROTOMY POSSIBLE BOWEL RESECTION;  Surgeon: Jules Husbands, MD;  Location: [facility];  Service: General;  Laterality: N/A;   LEFT HEART CATH AND CORONARY ANGIOGRAPHY N/A 05/17/2021   Procedure: LEFT HEART CATH AND CORONARY ANGIOGRAPHY;  Surgeon: Yolonda Kida, MD;  Location: [place] [facility];  Service: Cardiovascular;  Laterality: N/A;    LOWER EXTREMITY ANGIOGRAPHY Left 11/17/2019   Procedure: LOWER EXTREMITY ANGIOGRAPHY;  Surgeon: Algernon Huxley, MD;  Location: [place] [facility];  Service: Cardiovascular;  Laterality: Left;   LOWER EXTREMITY ANGIOGRAPHY Right 12/13/2020   Procedure: LOWER EXTREMITY ANGIOGRAPHY;  Surgeon: Algernon Huxley, MD;  Location: [place] [facility];  Service: Cardiovascular;  Laterality: Right;   LOWER EXTREMITY ANGIOGRAPHY Right 01/10/2021   Procedure: LOWER EXTREMITY ANGIOGRAPHY;  Surgeon: Algernon Huxley, MD;  Location: [place] [facility];  Service: Cardiovascular;  Laterality: Right;   LOWER EXTREMITY ANGIOGRAPHY Right 01/24/2021   Procedure: LOWER EXTREMITY ANGIOGRAPHY;  Surgeon: Algernon Huxley, MD;  Location: [place] [facility];  Service: Cardiovascular;  Laterality: Right;   LOWER EXTREMITY ANGIOGRAPHY Bilateral 06/28/2021   Procedure: Lower Extremity Angiography;  Surgeon: Algernon Huxley, MD;  Location: [place] [facility];  Service: Cardiovascular;  Laterality: Bilateral;   LOWER EXTREMITY ANGIOGRAPHY Right 11/28/2021   Procedure: LOWER EXTREMITY ANGIOGRAPHY;  Surgeon: Algernon Huxley, MD;  Location: [place] [facility];  Service: Cardiovascular;  Laterality: Right;   LOWER EXTREMITY ANGIOGRAPHY Left 11/30/2021   Procedure: Lower Extremity Angiography;  Surgeon: Algernon Huxley, MD;  Location: [place] [facility];  Service: Cardiovascular;  Laterality: Left;   LOWER EXTREMITY ANGIOGRAPHY Right 02/22/2022   Procedure: Lower Extremity Angiography;  Surgeon: Algernon Huxley, MD;  Location: [place] [facility];  Service: Cardiovascular;  Laterality: Right;   LOWER EXTREMITY ANGIOGRAPHY Right 03/13/2022   Procedure: Lower Extremity Angiography;  Surgeon: Algernon Huxley, MD;  Location: [place] [facility];  Service: Cardiovascular;  Laterality: Right;   LOWER EXTREMITY ANGIOGRAPHY Right 03/24/2022   Procedure: Lower Extremity Angiography;  Surgeon: Algernon Huxley, MD;  Location: [place] [facility];  Service: Cardiovascular;   Laterality: Right;   LOWER EXTREMITY ANGIOGRAPHY Left 01/17/2023   Procedure: Lower Extremity Angiography;  Surgeon: Algernon Huxley, MD;  Location: [place] [facility];  Service: Cardiovascular;  Laterality: Left;   LOWER EXTREMITY INTERVENTION N/A 11/19/2019   Procedure: LOWER EXTREMITY INTERVENTION;  Surgeon: Algernon Huxley, MD;  Location: [place] [facility];  Service: Cardiovascular;  Laterality: N/A;   LOWER EXTREMITY INTERVENTION Bilateral 06/29/2021   Procedure: LOWER EXTREMITY INTERVENTION;  Surgeon: Algernon Huxley, MD;  Location: [place] [facility];  Service: Cardiovascular;  Laterality: Bilateral;   LOWER EXTREMITY INTERVENTION Left 01/18/2023   Procedure: LOWER EXTREMITY INTERVENTION;  Surgeon: Algernon Huxley, MD;  Location: [place] [facility];  Service: Cardiovascular;  Laterality: Left;   TEE WITHOUT CARDIOVERSION N/A 06/22/2017   Procedure: TRANSESOPHAGEAL ECHOCARDIOGRAM (TEE);  Surgeon: Wellington Hampshire, MD;  Location: [facility];  Service: Cardiovascular;  Laterality: N/A;   TOTAL KNEE ARTHROPLASTY Right 07/11/2018   Procedure: TOTAL KNEE ARTHROPLASTY;  Surgeon: Corky Mull, MD;  Location: [facility];  Service: Orthopedics;  Laterality: Right;   VAGINAL HYSTERECTOMY     VISCERAL ARTERY INTERVENTION N/A 06/20/2017   Procedure: Visceral Artery Intervention, possible aortic thrombectomy;  Surgeon: Algernon Huxley, MD;  Location: [place] [facility];  Service: Cardiovascular;  Laterality: N/A;   VISCERAL ARTERY INTERVENTION N/A 01/28/2018   Procedure: VISCERAL ARTERY INTERVENTION;  Surgeon: Algernon Huxley, MD;  Location: [place] [facility];  Service: Cardiovascular;  Laterality: N/A;    Social History   Socioeconomic History   Marital status: Single    Spouse name: Not on file   Number of children: Not on file   Years of education: Not on file   Highest education level: Not on file  Occupational History   Occupation: Unemployed  Tobacco Use   Smoking status: Former    Packs/day: 1     Types: Cigarettes    Quit date: 2023    Years since quitting: 1.2   Smokeless tobacco: Never  Vaping Use   Vaping Use: Never used  Substance and Sexual Activity   Alcohol use: No   Drug use: No   Sexual activity: Not on file  Other Topics Concern   Not on file  Social History Narrative   Lives in [place] with daughter   Social Determinants of Health   Financial Resource Strain: Not on file  Food Insecurity: No [place] (01/17/2023)   Hunger Vital Sign    Worried About Running Out of Food in the Last Year: Never true    Ran Out of Food in the Last Year: Never true  Transportation Needs: No Transportation Needs (01/17/2023)   PRAPARE - Hydrologist (Medical): No    Lack of Transportation (Non-Medical): No  Physical Activity: Not on file  Stress: Not on file  Social Connections: Not on file  Intimate Partner Violence: Not At Risk (01/17/2023)   Humiliation, Afraid, Rape, and Kick questionnaire    Fear of Current or Ex-Partner: No    Emotionally Abused: No    Physically Abused: No    Sexually Abused: No    Family History  Problem Relation Age of Onset   Hypertension Mother    Heart disease Mother    Heart attack Mother    Breast cancer Mother    Heart disease Father    Hypertension Father    Breast cancer Paternal Aunt    Allergies  Allergen Reactions   Gabapentin     Tremors    Bactrim [Sulfamethoxazole-Trimethoprim] Itching   I? Current Outpatient Medications  Medication Sig Dispense Refill   alum & mag hydroxide-simeth (MAALOX/MYLANTA) 200-200-20 MG/5ML suspension Take 15-30 mLs by mouth every 2 (two) hours as needed for indigestion. 355 mL 0   apixaban (ELIQUIS) 5 MG TABS tablet Take 1 tablet (5 mg total) by mouth 2 (two) times daily. 60 tablet 11   aspirin EC 81 MG tablet Take 81 mg by mouth daily.  Swallow whole.     atorvastatin (LIPITOR) 80 MG tablet Take 80 mg by mouth at bedtime.     bisacodyl (DULCOLAX) 5 MG  EC tablet Take 1 tablet (5 mg total) by mouth daily as needed for moderate constipation. 30 tablet 0   COMBIVENT RESPIMAT 20-100 MCG/ACT AERS respimat Inhale 1 puff into the lungs every 6 (six) hours as needed.     esomeprazole (NEXIUM) 20 MG capsule Take 20 mg by mouth daily.     feeding supplement (ENSURE ENLIVE / ENSURE PLUS) LIQD Take 237 mLs by mouth 2 (two) times daily between meals. 237 mL 12   ferrous sulfate 325 (65 FE) MG tablet Take 325 mg by mouth daily.     fluconazole (DIFLUCAN) 150 MG tablet Take 150 mg by mouth every 3 (three) days.     HYDROcodone-acetaminophen (NORCO) 10-325 MG tablet Take 1 tablet by mouth every 4 (four) hours as needed.     isosorbide mononitrate (IMDUR) 60 MG 24 hr tablet Take 60 mg by mouth daily.     lidocaine (LIDODERM) 5 % Place 1 patch onto the skin daily. Remove & Discard patch within 12 hours or as directed by MD 30 patch 0   metoprolol tartrate (LOPRESSOR) 25 MG tablet Take 0.5 tablets (12.5 mg total) by mouth 2 (two) times daily. Hold if SBP is 100 or less or HR is less than 60. Notify Provider (Patient taking differently: Take 50 mg by mouth 2 (two) times daily. Hold if SBP is 100 or less or HR is less than 60. Notify Provider)     metroNIDAZOLE (FLAGYL) 500 MG tablet Take 1 tablet (500 mg total) by mouth 2 (two) times daily. 28 tablet 0   Omadacycline Tosylate (NUZYRA) 150 MG TABS Take 2 tablets (300 mg total) by mouth daily. 28 tablet 0   oxyCODONE (ROXICODONE) 5 MG immediate release tablet Take 1-2 tablets (5-10 mg total) by mouth every 4 (four) hours as needed for severe pain. 30 tablet 0   oxyCODONE-acetaminophen (PERCOCET/ROXICET) 5-325 MG tablet Take 1-2 tablets by mouth every 4 (four) hours as needed for severe pain. 24 tablet 0   polyethylene glycol (MIRALAX / GLYCOLAX) 17 g packet Take 17 g by mouth daily. 14 each 0   pregabalin (LYRICA) 200 MG capsule Take 1 capsule (200 mg total) by mouth in the morning, at noon, and at bedtime. 90 capsule 3    citalopram (CELEXA) 40 MG tablet Take 1 tablet (40 mg total) by mouth daily. 30 tablet 2   Current Facility-Administered Medications  Medication Dose Route Frequency Provider Last Rate Last Admin   Vitamin D (Ergocalciferol) (DRISDOL) 1.25 MG (50000 UNIT) capsule 50,000 Units  50,000 Units Oral Once per day on Mon Wed Fri Pace, Brien R, NP         Abtx:  Anti-infectives (From admission, onward)    None       REVIEW OF SYSTEMS:  Const: negative fever, negative chills, negative weight loss Eyes: negative diplopia or visual changes, negative eye pain ENT: negative coryza, negative sore throat Resp: negative cough, hemoptysis, dyspnea Cards: negative for chest pain, palpitations, lower extremity edema GU: negative for frequency, dysuria and hematuria GI: Negative for abdominal pain, diarrhea, bleeding, constipation Skin: negative for rash and pruritus Heme: negative for easy bruising and gum/nose bleeding MS: tired and weak Neurolo:negative for headaches, dizziness, vertigo, memory problems  Psych:f anxiety, depression  Endocrine: negative for thyroid, diabetes Allergy/Immunology-gabapentin  and sulfa Objective:  VITALS:  BP 137/79  Pulse 85   Temp 97.9 F (36.6 C) (Temporal)   PHYSICAL EXAM:  General: Alert, cooperative, no distress, in wheelchair Head: Normocephalic, without obvious abnormality, atraumatic. Eyes: Conjunctivae clear, anicteric sclerae. Pupils are equal ENT Nares normal. No drainage or sinus tenderness. Lips, mucosa, and tongue normal. No Thrush Neck: Supple, symmetrical, no adenopathy, thyroid: non tender no carotid bruit and no JVD. Back: No CVA tenderness. Lungs: Clear to auscultation bilaterally. No Wheezing or Rhonchi. No rales. Heart: S1-S2 Abdomen: Did not examine  Extremities: Right AKA.  Stump has a wound that is covered with VAC Looked at pictures 01/21/23   01/11/23   01/11/23   Compared to 3/21 the wound is now smaller and less  deep and has some slough persisting at one edege' The left foot /toe is not cyanotic 01/25/23   01/17/23   Skin: Seborrheic dermatitis like rash on the face Lymph: Cervical, supraclavicular normal. Neurologic: Grossly non-focal  ? Impression/Recommendation Infected right AKA stump.  Status post revision in February Polymicrobial infection with ESBL E. coli, Enterococcus, MRSA.  Patient was on appropriate IV antibiotics from 12/07/2022 and was getting daptomycin and ertapenem IV on discharge.  PICC line fell out on 01/03/23 And she went on PO omadacycline and has completed antibiotics on 01/20/23 Wound looks better There si some slough at one end which can be managed by topical care No antibiotics currently needed, she is being seen by vascular this afternoon- communicated with NP and she will assess the wound and do culture if needed  Acute ischemia left leg was recently u=in hospital and underwent mechanical thrombectomy of the axillary femoral graft- on eliquis PAD with history of thrombectomy and bypass surgery __   HLD on statin Anemia   Paroxysmal A-fib    Discussed the management in detail with the patient and her daughter    ________________________________________________ Discussed with patient, and daughter Note:  This document was prepared using Dragon voice recognition software and may include unintentional dictation errors.

## 2023-01-26 ENCOUNTER — Other Ambulatory Visit (HOSPITAL_COMMUNITY): Payer: Self-pay

## 2023-01-26 NOTE — Telephone Encounter (Signed)
I had an extensive discussion with the patient in regards to home health.  There is some frustration on the patient's side as they feel that the home health nurse does not always come when she advises that she is going to do so.  But they are agreeable to try to continue to work with the patient.

## 2023-01-27 LAB — VITAMIN C: Vitamin C: 0.8 mg/dL (ref 0.4–2.0)

## 2023-01-29 ENCOUNTER — Telehealth (INDEPENDENT_AMBULATORY_CARE_PROVIDER_SITE_OTHER): Payer: Self-pay

## 2023-01-29 NOTE — Telephone Encounter (Signed)
That is fine 

## 2023-01-29 NOTE — Telephone Encounter (Signed)
Cindy from [employer] called for verbal orders to do physical therapy  for Pierrette 1 time a week for 5 weeks and 1 time every other week for 4 weeks.   Please advise

## 2023-01-30 NOTE — Telephone Encounter (Signed)
Left orders on secure voice mail

## 2023-01-31 ENCOUNTER — Telehealth (INDEPENDENT_AMBULATORY_CARE_PROVIDER_SITE_OTHER): Payer: Self-pay

## 2023-01-31 ENCOUNTER — Encounter: Payer: Self-pay | Admitting: Dietician

## 2023-01-31 ENCOUNTER — Other Ambulatory Visit (INDEPENDENT_AMBULATORY_CARE_PROVIDER_SITE_OTHER): Payer: Self-pay | Admitting: Nurse Practitioner

## 2023-01-31 LAB — AEROBIC CULTURE

## 2023-01-31 MED ORDER — PREGABALIN 200 MG PO CAPS: 200.0000 mg | ORAL_CAPSULE | Freq: Three times a day (TID) | ORAL | 3 refills | Status: AC

## 2023-01-31 NOTE — Telephone Encounter (Signed)
Message relayed.

## 2023-01-31 NOTE — Telephone Encounter (Signed)
Refill sent.

## 2023-01-31 NOTE — Telephone Encounter (Signed)
Tiffany from [employer] is calling to get approval for [facility] home health certification. She needs verbal orders for 2 times a week for 1 week and 3 times a week for 8 weeks for the wound vac. Starting yesterday 01/30/23.  Please advise

## 2023-01-31 NOTE — Progress Notes (Signed)
Just wanted to update that results were availabe.  Thank you

## 2023-01-31 NOTE — Progress Notes (Signed)
Nutrition Brief Note   Vitamin labs sent out while patient was hospitalized to ensure levels were adequate for wound healing. Vitamin D returned significantly low. Pt was recommended for supplementation prior to discharge but refused supplementation as she did not want to take additional pills.   -Recommend ergocalciferol 50,000 IU po weekly x 6 weeks -Recommend recheck vitamin D lab 60-90 days after supplementation.   Betsey Holiday MS, RD, LDN Please refer to [facility] for RD and/or RD on-call/weekend/after hours pager

## 2023-01-31 NOTE — Telephone Encounter (Signed)
Yes that is fine

## 2023-01-31 NOTE — Telephone Encounter (Signed)
Carla [name] called requesting a refill for Lyrica 200 mg.  Please advise

## 2023-02-01 ENCOUNTER — Other Ambulatory Visit (HOSPITAL_COMMUNITY): Payer: Self-pay

## 2023-02-01 ENCOUNTER — Telehealth: Payer: Self-pay

## 2023-02-01 NOTE — Telephone Encounter (Signed)
Patient's daughter will come by [facility] tomorrow to pick up samples

## 2023-02-01 NOTE — Telephone Encounter (Signed)
I attempted to relay results to patient's daughter Gearldine Bienenstock. I LVM for her to call the office back to let her know that her culture was positive and she will need to restart the Omadacycline that she should have received from [facility]. Anubis Fundora T Pricilla Loveless

## 2023-02-01 NOTE — Telephone Encounter (Signed)
-----   Message from Lynn Ito, MD sent at 01/31/2023 10:16 PM EDT ----- Steffi Noviello, can you please check whether she got omadacycline from [facility], and if so to start taking it for 2 weeks- thx ----- Message ----- From: Georgiana Spinner, NP Sent: 01/31/2023  12:41 PM EDT To: Lynn Ito, MD  Just wanted to update that results were availabe.  Thank you

## 2023-02-01 NOTE — Telephone Encounter (Signed)
Patient advised that of culture results and states they did not received the [place].  I confirmed with [facility] and patient should receive it by Tues.  Is it ok for her to wait until Tues to start the medication? Maisyn Nouri T Pricilla Loveless

## 2023-02-02 ENCOUNTER — Other Ambulatory Visit: Payer: Self-pay

## 2023-02-02 ENCOUNTER — Other Ambulatory Visit: Payer: Self-pay | Admitting: Infectious Diseases

## 2023-02-02 ENCOUNTER — Other Ambulatory Visit (HOSPITAL_COMMUNITY): Payer: Self-pay

## 2023-02-02 NOTE — Progress Notes (Signed)
MRSA/ESBL ecoli stump infection  Pts daughter [name] came and picked up 12 tablets of omadacycline 150mg  each ( professional sample)- Pt will take 3 a day for 2 days and then 2 a day until she gets omadacycline delivered to her from [facility]. She will have to fast for 4 hrs and then take the medicine and then not eat for 2 hrs following that. She has taken this before and is aware

## 2023-02-08 ENCOUNTER — Ambulatory Visit (INDEPENDENT_AMBULATORY_CARE_PROVIDER_SITE_OTHER): Payer: 59 | Admitting: Nurse Practitioner

## 2023-02-09 ENCOUNTER — Telehealth (INDEPENDENT_AMBULATORY_CARE_PROVIDER_SITE_OTHER): Payer: Self-pay

## 2023-02-09 ENCOUNTER — Ambulatory Visit (INDEPENDENT_AMBULATORY_CARE_PROVIDER_SITE_OTHER): Payer: 59 | Admitting: Nurse Practitioner

## 2023-02-09 ENCOUNTER — Encounter (INDEPENDENT_AMBULATORY_CARE_PROVIDER_SITE_OTHER): Payer: Self-pay | Admitting: Nurse Practitioner

## 2023-02-09 VITALS — BP 146/77 | HR 75 | Resp 16

## 2023-02-09 DIAGNOSIS — I70222 Atherosclerosis of native arteries of extremities with rest pain, left leg: Secondary | ICD-10-CM

## 2023-02-09 DIAGNOSIS — Z89611 Acquired absence of right leg above knee: Secondary | ICD-10-CM

## 2023-02-09 NOTE — Telephone Encounter (Signed)
Carla [name] from [facility] called stating that she had and appt 02/07/23 with Carla [name] to change her wound vac no one answered the phone or the door. She he got a hold of the daughter later that day and rescheduled for 02/08/23, but the daughter texted that morning and cancel the appt and said that Carla [name] has an appt 02/09/23 to see Carla [name] and she didn't have to come. The wound vac has not been changed since Monday 02/05/23.

## 2023-02-12 ENCOUNTER — Encounter (INDEPENDENT_AMBULATORY_CARE_PROVIDER_SITE_OTHER): Payer: Self-pay | Admitting: Nurse Practitioner

## 2023-02-12 NOTE — Progress Notes (Signed)
Subjective:    Patient ID: Carla [name], female    DOB: 23-May-1961, 62 y.o.   MRN: [id number] Chief Complaint  Patient presents with   Follow-up    2 week follow up    Carla [name] is a 62 year old female who returns today for follow-up wound evaluation of her right above-knee amputation.  She recently underwent angiogram on her left lower extremity which was successful at revascularization.  The patient still continues to have some numbness and discomfort but I suspect this is related to multiple episodes of ischemia over the years.  The patient's wound on her right above-knee amputation site is improving the depth is decreasing and the size is also decreasing.      Review of Systems  Skin:  Positive for color change and wound.  Psychiatric/Behavioral:  The patient is nervous/anxious.   All other systems reviewed and are negative.      Objective:   Physical Exam Vitals reviewed.  HENT:     Head: Normocephalic.  Cardiovascular:     Rate and Rhythm: Normal rate.  Pulmonary:     Effort: Pulmonary effort is normal.  Musculoskeletal:     Right Lower Extremity: Right leg is amputated below knee.  Skin:    Coloration: Skin is pale.  Neurological:     Mental Status: She is alert and oriented to person, place, and time.  Psychiatric:        Mood and Affect: Affect is tearful.        Behavior: Behavior normal.        Thought Content: Thought content normal.        Judgment: Judgment normal.       BP (!) 159/85 (BP Location: Left Arm)   Pulse 73   Resp 16   Past Medical History:  Diagnosis Date   Abdominal aortic atherosclerosis    a. 05/2017 CTA abd/pelvis: significant atherosclerotic dzs of the infrarenal abd Ao w/ some mural thrombus. No aneurysm or dissection.   Acute focal ischemia of small intestine    Acute right-sided low back pain with right-sided sciatica 06/13/2017   AKI (acute kidney injury) 07/29/2017   Anemia    Anginal pain    a. 08/2012 Lexiscan MV: EF  54%, non ischemia/infarct.   Anxiety and depression    Baker's cyst of knee, right    a. 07/2016 U/S: 4.1 x 1.4 x 2.9 cystic structure in R poplitetal fossa.   Bell's palsy    CAD (coronary artery disease)    CHF (congestive heart failure)    a.) TTE 06/20/2017: EF 60-65%, no rwma, G1DD, no source of cardiac emboli.   Chronic anticoagulation    a.) apixaban   Chronic idiopathic constipation    Chronic mesenteric ischemia    Chronic pain    COPD (chronic obstructive pulmonary disease)    Dyspnea    Embolus of superior mesenteric artery (HCC)    a. 05/2017 CTA Abd/pelvis: apparent thrombus or embolus in prox SMA (70-90%); b. 05/2017 catheter directed tPA, mechanical thrombectomy, and stenting of the SMA.   Essential hypertension with goal blood pressure less than 140/90 01/19/2016   Gastroesophageal reflux disease 01/19/2016   GERD (gastroesophageal reflux disease)    H/O colectomy    History of 2019 novel coronavirus disease (COVID-19) 11/14/2019   History of kidney stones    Hyperlipidemia    Hypertension    Morbid obesity with BMI of 40.0-44.9, adult    NSTEMI (non-ST elevated myocardial infarction) 06/27/2021  a.) high sensitivity troponins trended: 893 --> 1587 --> 1748 --> 868 --> 735 ng/L   Occlusive mesenteric ischemia 06/19/2017   Osteoarthritis    PAD (peripheral artery disease)    PAF (paroxysmal atrial fibrillation)    a.) CHA2DS2-VASc = 6 (sex, CHF, HTN, DVT x2, aortic plaque). b.) rate/rhythm maintained on oral metoprolol tartrate; chronically anticoagulated with standard dose apixaban.   Palpitations    Peripheral edema    Personal history of other malignant neoplasm of skin 06/01/2016   Pneumonia    Pre-diabetes    Primary osteoarthritis of both knees 06/13/2017   SBO (small bowel obstruction) 08/06/2017   Skin cancer of nose    Superior mesenteric artery thrombosis    Vitamin D deficiency     Social History   Socioeconomic History   Marital status: Single     Spouse name: Not on file   Number of children: Not on file   Years of education: Not on file   Highest education level: Not on file  Occupational History   Occupation: Unemployed  Tobacco Use   Smoking status: Former    Packs/day: 1    Types: Cigarettes    Quit date: 2023    Years since quitting: 1.3   Smokeless tobacco: Never  Vaping Use   Vaping Use: Never used  Substance and Sexual Activity   Alcohol use: No   Drug use: No   Sexual activity: Not on file  Other Topics Concern   Not on file  Social History Narrative   Lives in [place] with daughter   Social Determinants of Health   Financial Resource Strain: Not on file  Food Insecurity: No Food Insecurity (01/17/2023)   Hunger Vital Sign    Worried About Running Out of Food in the Last Year: Never true    Ran Out of Food in the Last Year: Never true  Transportation Needs: No Transportation Needs (01/17/2023)   PRAPARE - Administrator, Civil Service (Medical): No    Lack of Transportation (Non-Medical): No  Physical Activity: Not on file  Stress: Not on file  Social Connections: Not on file  Intimate Partner Violence: Not At Risk (01/17/2023)   Humiliation, Afraid, Rape, and Kick questionnaire    Fear of Current or Ex-Partner: No    Emotionally Abused: No    Physically Abused: No    Sexually Abused: No    Past Surgical History:  Procedure Laterality Date   AMPUTATION Right 04/06/2022   Procedure: AMPUTATION ABOVE KNEE;  Surgeon: Annice Needy, MD;  Location: [facility];  Service: Vascular;  Laterality: Right;   AMPUTATION  12/07/2022   Procedure: AMPUTATION ABOVE KNEE REVISION WITH RESECTION OF FEMUR;  Surgeon: Annice Needy, MD;  Location: [facility];  Service: Vascular;;   AMPUTATION TOE Left 12/09/2019   Procedure: AMPUTATION TOE MPJ left;  Surgeon: Rosetta Posner, DPM;  Location: [facility];  Service: Podiatry;  Laterality: Left;   APPENDECTOMY     APPLICATION OF WOUND VAC  12/01/2021   Procedure:  APPLICATION OF WOUND VAC;  Surgeon: Annice Needy, MD;  Location: [facility];  Service: Vascular;;   APPLICATION OF WOUND VAC Right 05/03/2022   Procedure: APPLICATION OF WOUND VAC;  Surgeon: Annice Needy, MD;  Location: [facility];  Service: Vascular;  Laterality: Right;   APPLICATION OF WOUND VAC Left 07/23/2022   Procedure: APPLICATION OF WOUND VAC;  Surgeon: Nada Libman, MD;  Location: [facility];  Service: Vascular;  Laterality: Left;  Prevena   APPLICATION OF WOUND VAC Right 08/28/2022   Procedure: APPLICATION OF WOUND VAC;  Surgeon: Annice Needy, MD;  Location: [facility];  Service: Vascular;  Laterality: Right;   APPLICATION OF WOUND VAC  12/07/2022   Procedure: APPLICATION OF WOUND VAC;  Surgeon: Annice Needy, MD;  Location: [facility];  Service: Vascular;;   AXILLARY-FEMORAL BYPASS GRAFT Left 07/23/2022   Procedure: BYPASS GRAFT AXILLA-BIFEMORAL;  Surgeon: Nada Libman, MD;  Location: [facility];  Service: Vascular;  Laterality: Left;   CHOLECYSTECTOMY     COLON SURGERY     COLONOSCOPY WITH PROPOFOL N/A 03/23/2022   Procedure: COLONOSCOPY WITH PROPOFOL;  Surgeon: Wyline Mood, MD;  Location: [facility];  Service: Gastroenterology;  Laterality: N/A;  rectal bleed   EMBOLECTOMY OR THROMBECTOMY, WITH OR WITHOUT CATHETER; FEMOROPOPLITEAL, AORTOILIAC ARTERY, BY LEG INCISION N/A 06/21/2020   Left - Decomp Fasciotomy Leg; Ant &/Or Lat Compart Only; Midline - Angiography, Visceral, Selective Or Supraselective (With Or Without Flush Aortogram); Left - Revascularize, Endovasc, Open/Percut, Iliac Artery, Unilat, Initial Vessel; W/Translum Stent, W/Angioplasty; Right - Revascularize, Endovasc, Open/Percut, Iliac Artery, Ea Add`L Ipsilateral; W/Translumin Stent, W/Angioplasty; Location [facility];   ENDARTERECTOMY FEMORAL Left 12/01/2021   Procedure: ENDARTERECTOMY FEMORAL;  Surgeon: Annice Needy, MD;  Location: [facility];  Service: Vascular;  Laterality: Left;   ENDOVASCULAR REPAIR/STENT GRAFT Left 12/01/2021    Procedure: ENDOVASCULAR REPAIR/STENT GRAFT;  Surgeon: Annice Needy, MD;  Location: [facility];  Service: Cardiovascular;  Laterality: Left;   INCISION AND DRAINAGE OF WOUND Right 05/03/2022   Procedure: IRRIGATION AND DEBRIDEMENT RIGHT STUMP;  Surgeon: Annice Needy, MD;  Location: [facility];  Service: Vascular;  Laterality: Right;   IRRIGATION AND DEBRIDEMENT ABSCESS Right 08/28/2022   Procedure: AKA IRRIGATION AND DEBRIDEMENT ABSCESS;  Surgeon: Annice Needy, MD;  Location: [facility];  Service: Vascular;  Laterality: Right;   LAPAROTOMY N/A 08/08/2017   Procedure: EXPLORATORY LAPAROTOMY POSSIBLE BOWEL RESECTION;  Surgeon: Leafy Ro, MD;  Location: [facility];  Service: General;  Laterality: N/A;   LEFT HEART CATH AND CORONARY ANGIOGRAPHY N/A 05/17/2021   Procedure: LEFT HEART CATH AND CORONARY ANGIOGRAPHY;  Surgeon: Alwyn Pea, MD;  Location: [facility];  Service: Cardiovascular;  Laterality: N/A;   LOWER EXTREMITY ANGIOGRAPHY Left 11/17/2019   Procedure: LOWER EXTREMITY ANGIOGRAPHY;  Surgeon: Annice Needy, MD;  Location: [facility];  Service: Cardiovascular;  Laterality: Left;   LOWER EXTREMITY ANGIOGRAPHY Right 12/13/2020   Procedure: LOWER EXTREMITY ANGIOGRAPHY;  Surgeon: Annice Needy, MD;  Location: [facility];  Service: Cardiovascular;  Laterality: Right;   LOWER EXTREMITY ANGIOGRAPHY Right 01/10/2021   Procedure: LOWER EXTREMITY ANGIOGRAPHY;  Surgeon: Annice Needy, MD;  Location: [facility];  Service: Cardiovascular;  Laterality: Right;   LOWER EXTREMITY ANGIOGRAPHY Right 01/24/2021   Procedure: LOWER EXTREMITY ANGIOGRAPHY;  Surgeon: Annice Needy, MD;  Location: [facility];  Service: Cardiovascular;  Laterality: Right;   LOWER EXTREMITY ANGIOGRAPHY Bilateral 06/28/2021   Procedure: Lower Extremity Angiography;  Surgeon: Annice Needy, MD;  Location: [facility];  Service: Cardiovascular;  Laterality: Bilateral;   LOWER  EXTREMITY ANGIOGRAPHY Right 11/28/2021   Procedure: LOWER EXTREMITY ANGIOGRAPHY;  Surgeon: Annice Needy, MD;  Location: [facility];  Service: Cardiovascular;  Laterality: Right;   LOWER EXTREMITY ANGIOGRAPHY Left 11/30/2021   Procedure: Lower Extremity Angiography;  Surgeon: Annice Needy, MD;  Location: [facility];  Service: Cardiovascular;  Laterality: Left;   LOWER EXTREMITY ANGIOGRAPHY Right 02/22/2022   Procedure: Lower Extremity Angiography;  Surgeon: Annice Needy, MD;  Location: [facility];  Service: Cardiovascular;  Laterality: Right;   LOWER EXTREMITY ANGIOGRAPHY Right 03/13/2022   Procedure: Lower Extremity Angiography;  Surgeon: Annice Needy, MD;  Location: [facility];  Service: Cardiovascular;  Laterality: Right;   LOWER EXTREMITY ANGIOGRAPHY Right 03/24/2022   Procedure: Lower Extremity Angiography;  Surgeon: Annice Needy, MD;  Location: [facility];  Service: Cardiovascular;  Laterality: Right;   LOWER EXTREMITY ANGIOGRAPHY Left 01/17/2023   Procedure: Lower Extremity Angiography;  Surgeon: Annice Needy, MD;  Location: [facility];  Service: Cardiovascular;  Laterality: Left;   LOWER EXTREMITY INTERVENTION N/A 11/19/2019   Procedure: LOWER EXTREMITY INTERVENTION;  Surgeon: Annice Needy, MD;  Location: [facility];  Service: Cardiovascular;  Laterality: N/A;   LOWER EXTREMITY INTERVENTION Bilateral 06/29/2021   Procedure: LOWER EXTREMITY INTERVENTION;  Surgeon: Annice Needy, MD;  Location: [facility];  Service: Cardiovascular;  Laterality: Bilateral;   LOWER EXTREMITY INTERVENTION Left 01/18/2023   Procedure: LOWER EXTREMITY INTERVENTION;  Surgeon: Annice Needy, MD;  Location: [facility];  Service: Cardiovascular;  Laterality: Left;   TEE WITHOUT CARDIOVERSION N/A 06/22/2017   Procedure: TRANSESOPHAGEAL ECHOCARDIOGRAM (TEE);  Surgeon: Iran Ouch, MD;  Location: [facility];  Service: Cardiovascular;   Laterality: N/A;   TOTAL KNEE ARTHROPLASTY Right 07/11/2018   Procedure: TOTAL KNEE ARTHROPLASTY;  Surgeon: Christena Flake, MD;  Location: [facility];  Service: Orthopedics;  Laterality: Right;   VAGINAL HYSTERECTOMY     VISCERAL ARTERY INTERVENTION N/A 06/20/2017   Procedure: Visceral Artery Intervention, possible aortic thrombectomy;  Surgeon: Annice Needy, MD;  Location: [facility];  Service: Cardiovascular;  Laterality: N/A;   VISCERAL ARTERY INTERVENTION N/A 01/28/2018   Procedure: VISCERAL ARTERY INTERVENTION;  Surgeon: Annice Needy, MD;  Location: [facility];  Service: Cardiovascular;  Laterality: N/A;    Family History  Problem Relation Age of Onset   Hypertension Mother    Heart disease Mother    Heart attack Mother    Breast cancer Mother    Heart disease Father    Hypertension Father    Breast cancer Paternal Aunt     Allergies  Allergen Reactions   Gabapentin     Tremors    Bactrim [Sulfamethoxazole-Trimethoprim] Itching       Latest Ref Rng & Units 01/19/2023    4:25 AM 01/18/2023   10:33 PM 01/18/2023    4:32 PM  CBC  WBC 4.0 - 10.5 K/uL 3.2     Hemoglobin 12.0 - 15.0 g/dL 8.8  8.8  8.3   Hematocrit 36.0 - 46.0 % 27.7  27.5  26.4   Platelets 150 - 400 K/uL 137         CMP     Component Value Date/Time   NA 140 01/19/2023 0425   NA 140 02/12/2015 1137   K 3.5 01/19/2023 0425   K 2.9 (L) 02/12/2015 1137   CL 111 01/19/2023 0425   CL 107 02/12/2015 1137   CO2 24 01/19/2023 0425   CO2 27 02/12/2015 1137   GLUCOSE 115 (H) 01/19/2023 0425   GLUCOSE 120 (H) 02/12/2015 1137   BUN 7 (L) 01/19/2023 0425   BUN 15 02/12/2015 1137   CREATININE 0.56 01/19/2023 0425  CREATININE 0.78 02/12/2015 1137   CALCIUM 8.0 (L) 01/19/2023 0425   CALCIUM 9.0 02/12/2015 1137   PROT 7.0 01/17/2023 1723   PROT 7.0 09/14/2020 1139   PROT 7.1 01/19/2015 1112   ALBUMIN 3.0 (L) 01/17/2023 1723   ALBUMIN 3.9 09/14/2020 1139   ALBUMIN 3.8 01/19/2015 1112    AST 12 (L) 01/17/2023 1723   AST 17 01/19/2015 1112   ALT 11 01/17/2023 1723   ALT 11 (L) 01/19/2015 1112   ALKPHOS 96 01/17/2023 1723   ALKPHOS 84 01/19/2015 1112   BILITOT 0.3 01/17/2023 1723   BILITOT <0.2 09/14/2020 1139   BILITOT 0.5 01/19/2015 1112   GFRNONAA >60 01/19/2023 0425   GFRNONAA >60 02/12/2015 1137   GFRAA >60 11/23/2019 1330   GFRAA >60 02/12/2015 1137     No results found.     Assessment & Plan:   1. Critical limb ischemia of left lower extremity (HCC) The patient's left lower extremity is improved.  Much better and her leg is warm.  Will continue to monitor with ABIs.  2. S/P AKA (above knee amputation) unilateral, right (HCC) The patient has improvement of the wound with continued use of her wound VAC.  There is significantly less fibrinous exudate at the posterior portion.  There is good granulation at the base.  Will continue with wound VAC usage and have the patient follow-up in 2 weeks.   Current Outpatient Medications on File Prior to Visit  Medication Sig Dispense Refill   alum & mag hydroxide-simeth (MAALOX/MYLANTA) 200-200-20 MG/5ML suspension Take 15-30 mLs by mouth every 2 (two) hours as needed for indigestion. 355 mL 0   apixaban (ELIQUIS) 5 MG TABS tablet Take 1 tablet (5 mg total) by mouth 2 (two) times daily. 60 tablet 11   aspirin EC 81 MG tablet Take 81 mg by mouth daily. Swallow whole.     atorvastatin (LIPITOR) 80 MG tablet Take 80 mg by mouth at bedtime.     bisacodyl (DULCOLAX) 5 MG EC tablet Take 1 tablet (5 mg total) by mouth daily as needed for moderate constipation. 30 tablet 0   COMBIVENT RESPIMAT 20-100 MCG/ACT AERS respimat Inhale 1 puff into the lungs every 6 (six) hours as needed.     esomeprazole (NEXIUM) 20 MG capsule Take 20 mg by mouth daily.     feeding supplement (ENSURE ENLIVE / ENSURE PLUS) LIQD Take 237 mLs by mouth 2 (two) times daily between meals. 237 mL 12   ferrous sulfate 325 (65 FE) MG tablet Take 325 mg by mouth  daily.     fluconazole (DIFLUCAN) 150 MG tablet Take 150 mg by mouth every 3 (three) days.     HYDROcodone-acetaminophen (NORCO) 10-325 MG tablet Take 1 tablet by mouth every 4 (four) hours as needed.     isosorbide mononitrate (IMDUR) 60 MG 24 hr tablet Take 60 mg by mouth daily.     lidocaine (LIDODERM) 5 % Place 1 patch onto the skin daily. Remove & Discard patch within 12 hours or as directed by MD 30 patch 0   metoprolol tartrate (LOPRESSOR) 25 MG tablet Take 0.5 tablets (12.5 mg total) by mouth 2 (two) times daily. Hold if SBP is 100 or less or HR is less than 60. Notify Provider (Patient taking differently: Take 50 mg by mouth 2 (two) times daily. Hold if SBP is 100 or less or HR is less than 60. Notify Provider)     metroNIDAZOLE (FLAGYL) 500 MG tablet Take 1 tablet (500  mg total) by mouth 2 (two) times daily. 28 tablet 0   Omadacycline Tosylate (NUZYRA) 150 MG TABS Take 2 tablets (300 mg total) by mouth daily. 28 tablet 0   oxyCODONE (ROXICODONE) 5 MG immediate release tablet Take 1-2 tablets (5-10 mg total) by mouth every 4 (four) hours as needed for severe pain. 30 tablet 0   oxyCODONE-acetaminophen (PERCOCET/ROXICET) 5-325 MG tablet Take 1-2 tablets by mouth every 4 (four) hours as needed for severe pain. 24 tablet 0   polyethylene glycol (MIRALAX / GLYCOLAX) 17 g packet Take 17 g by mouth daily. 14 each 0   citalopram (CELEXA) 40 MG tablet Take 1 tablet (40 mg total) by mouth daily. 30 tablet 2   Current Facility-Administered Medications on File Prior to Visit  Medication Dose Route Frequency Provider Last Rate Last Admin   Vitamin D (Ergocalciferol) (DRISDOL) 1.25 MG (50000 UNIT) capsule 50,000 Units  50,000 Units Oral Once per day on Mon Wed Fri Pace, Brien R, NP        There are no Patient Instructions on file for this visit. No follow-ups on file.   Georgiana Spinner, NP

## 2023-02-15 ENCOUNTER — Encounter
Admission: EM | Disposition: A | Discharge status: Home Health | Payer: Self-pay | Source: Home / Self Care | Attending: Internal Medicine

## 2023-02-15 ENCOUNTER — Telehealth (INDEPENDENT_AMBULATORY_CARE_PROVIDER_SITE_OTHER): Payer: Self-pay | Admitting: Vascular Surgery

## 2023-02-15 ENCOUNTER — Inpatient Hospital Stay
Admission: EM | Admit: 2023-02-15 | Discharge: 2023-02-24 | DRG: 240 | Disposition: A | Discharge status: Home Health | Payer: 59 | Attending: Internal Medicine | Admitting: Internal Medicine

## 2023-02-15 DIAGNOSIS — R9431 Abnormal electrocardiogram [ECG] [EKG]: Secondary | ICD-10-CM | POA: Diagnosis not present

## 2023-02-15 DIAGNOSIS — Z8249 Family history of ischemic heart disease and other diseases of the circulatory system: Secondary | ICD-10-CM | POA: Diagnosis not present

## 2023-02-15 DIAGNOSIS — T82868A Thrombosis of vascular prosthetic devices, implants and grafts, initial encounter: Secondary | ICD-10-CM

## 2023-02-15 DIAGNOSIS — E559 Vitamin D deficiency, unspecified: Secondary | ICD-10-CM | POA: Diagnosis present

## 2023-02-15 DIAGNOSIS — K5900 Constipation, unspecified: Secondary | ICD-10-CM | POA: Insufficient documentation

## 2023-02-15 DIAGNOSIS — K5904 Chronic idiopathic constipation: Secondary | ICD-10-CM | POA: Diagnosis present

## 2023-02-15 DIAGNOSIS — Z85828 Personal history of other malignant neoplasm of skin: Secondary | ICD-10-CM

## 2023-02-15 DIAGNOSIS — K429 Umbilical hernia without obstruction or gangrene: Secondary | ICD-10-CM | POA: Diagnosis present

## 2023-02-15 DIAGNOSIS — T8744 Infection of amputation stump, left lower extremity: Secondary | ICD-10-CM | POA: Diagnosis present

## 2023-02-15 DIAGNOSIS — Z6841 Body Mass Index (BMI) 40.0 and over, adult: Secondary | ICD-10-CM | POA: Diagnosis not present

## 2023-02-15 DIAGNOSIS — Z87891 Personal history of nicotine dependence: Secondary | ICD-10-CM | POA: Diagnosis not present

## 2023-02-15 DIAGNOSIS — Z8701 Personal history of pneumonia (recurrent): Secondary | ICD-10-CM

## 2023-02-15 DIAGNOSIS — Z882 Allergy status to sulfonamides status: Secondary | ICD-10-CM

## 2023-02-15 DIAGNOSIS — J439 Emphysema, unspecified: Secondary | ICD-10-CM | POA: Diagnosis not present

## 2023-02-15 DIAGNOSIS — K551 Chronic vascular disorders of intestine: Secondary | ICD-10-CM | POA: Diagnosis present

## 2023-02-15 DIAGNOSIS — I70229 Atherosclerosis of native arteries of extremities with rest pain, unspecified extremity: Secondary | ICD-10-CM | POA: Diagnosis not present

## 2023-02-15 DIAGNOSIS — Z8616 Personal history of COVID-19: Secondary | ICD-10-CM

## 2023-02-15 DIAGNOSIS — Z7982 Long term (current) use of aspirin: Secondary | ICD-10-CM

## 2023-02-15 DIAGNOSIS — T8740 Infection of amputation stump, unspecified extremity: Secondary | ICD-10-CM | POA: Diagnosis not present

## 2023-02-15 DIAGNOSIS — E785 Hyperlipidemia, unspecified: Secondary | ICD-10-CM | POA: Diagnosis present

## 2023-02-15 DIAGNOSIS — Z86718 Personal history of other venous thrombosis and embolism: Secondary | ICD-10-CM

## 2023-02-15 DIAGNOSIS — I252 Old myocardial infarction: Secondary | ICD-10-CM

## 2023-02-15 DIAGNOSIS — I70222 Atherosclerosis of native arteries of extremities with rest pain, left leg: Principal | ICD-10-CM | POA: Diagnosis present

## 2023-02-15 DIAGNOSIS — J449 Chronic obstructive pulmonary disease, unspecified: Secondary | ICD-10-CM | POA: Diagnosis present

## 2023-02-15 DIAGNOSIS — Z560 Unemployment, unspecified: Secondary | ICD-10-CM

## 2023-02-15 DIAGNOSIS — Z79899 Other long term (current) drug therapy: Secondary | ICD-10-CM

## 2023-02-15 DIAGNOSIS — R202 Paresthesia of skin: Secondary | ICD-10-CM | POA: Diagnosis present

## 2023-02-15 DIAGNOSIS — M1991 Primary osteoarthritis, unspecified site: Secondary | ICD-10-CM | POA: Diagnosis present

## 2023-02-15 DIAGNOSIS — Z7901 Long term (current) use of anticoagulants: Secondary | ICD-10-CM | POA: Diagnosis not present

## 2023-02-15 DIAGNOSIS — I110 Hypertensive heart disease with heart failure: Secondary | ICD-10-CM | POA: Diagnosis present

## 2023-02-15 DIAGNOSIS — I2510 Atherosclerotic heart disease of native coronary artery without angina pectoris: Secondary | ICD-10-CM | POA: Diagnosis present

## 2023-02-15 DIAGNOSIS — Z87442 Personal history of urinary calculi: Secondary | ICD-10-CM

## 2023-02-15 DIAGNOSIS — R7303 Prediabetes: Secondary | ICD-10-CM | POA: Diagnosis present

## 2023-02-15 DIAGNOSIS — I5032 Chronic diastolic (congestive) heart failure: Secondary | ICD-10-CM | POA: Diagnosis not present

## 2023-02-15 DIAGNOSIS — I998 Other disorder of circulatory system: Secondary | ICD-10-CM | POA: Diagnosis not present

## 2023-02-15 DIAGNOSIS — Z96651 Presence of right artificial knee joint: Secondary | ICD-10-CM | POA: Diagnosis present

## 2023-02-15 DIAGNOSIS — F32A Depression, unspecified: Secondary | ICD-10-CM | POA: Diagnosis present

## 2023-02-15 DIAGNOSIS — Z8719 Personal history of other diseases of the digestive system: Secondary | ICD-10-CM

## 2023-02-15 DIAGNOSIS — G8929 Other chronic pain: Secondary | ICD-10-CM | POA: Diagnosis present

## 2023-02-15 DIAGNOSIS — I480 Paroxysmal atrial fibrillation: Secondary | ICD-10-CM | POA: Diagnosis not present

## 2023-02-15 DIAGNOSIS — E669 Obesity, unspecified: Secondary | ICD-10-CM | POA: Insufficient documentation

## 2023-02-15 DIAGNOSIS — G894 Chronic pain syndrome: Secondary | ICD-10-CM

## 2023-02-15 DIAGNOSIS — I25118 Atherosclerotic heart disease of native coronary artery with other forms of angina pectoris: Secondary | ICD-10-CM

## 2023-02-15 DIAGNOSIS — Z89611 Acquired absence of right leg above knee: Secondary | ICD-10-CM | POA: Diagnosis not present

## 2023-02-15 DIAGNOSIS — R200 Anesthesia of skin: Secondary | ICD-10-CM | POA: Diagnosis present

## 2023-02-15 DIAGNOSIS — K5909 Other constipation: Secondary | ICD-10-CM | POA: Diagnosis not present

## 2023-02-15 DIAGNOSIS — I700 Atherosclerosis of aorta: Secondary | ICD-10-CM | POA: Diagnosis present

## 2023-02-15 DIAGNOSIS — F419 Anxiety disorder, unspecified: Secondary | ICD-10-CM | POA: Diagnosis present

## 2023-02-15 DIAGNOSIS — Z888 Allergy status to other drugs, medicaments and biological substances status: Secondary | ICD-10-CM

## 2023-02-15 DIAGNOSIS — K219 Gastro-esophageal reflux disease without esophagitis: Secondary | ICD-10-CM | POA: Diagnosis present

## 2023-02-15 DIAGNOSIS — Y835 Amputation of limb(s) as the cause of abnormal reaction of the patient, or of later complication, without mention of misadventure at the time of the procedure: Secondary | ICD-10-CM | POA: Diagnosis present

## 2023-02-15 DIAGNOSIS — M5441 Lumbago with sciatica, right side: Secondary | ICD-10-CM | POA: Diagnosis present

## 2023-02-15 DIAGNOSIS — Z9049 Acquired absence of other specified parts of digestive tract: Secondary | ICD-10-CM

## 2023-02-15 DIAGNOSIS — Z90710 Acquired absence of both cervix and uterus: Secondary | ICD-10-CM

## 2023-02-15 DIAGNOSIS — M79605 Pain in left leg: Secondary | ICD-10-CM | POA: Diagnosis present

## 2023-02-15 DIAGNOSIS — M170 Bilateral primary osteoarthritis of knee: Secondary | ICD-10-CM | POA: Diagnosis present

## 2023-02-15 HISTORY — PX: LEFT HEART CATH AND CORONARY ANGIOGRAPHY: CATH118249

## 2023-02-15 HISTORY — PX: CORONARY/GRAFT ACUTE MI REVASCULARIZATION: CATH118305

## 2023-02-15 LAB — LACTIC ACID, PLASMA: Lactic Acid, Venous: 1.6 mmol/L (ref 0.5–1.9)

## 2023-02-15 LAB — CBC
HCT: 34.2 % — ABNORMAL LOW (ref 36.0–46.0)
Hemoglobin: 10.4 g/dL — ABNORMAL LOW (ref 12.0–15.0)
MCH: 28 pg (ref 26.0–34.0)
MCHC: 30.4 g/dL (ref 30.0–36.0)
MCV: 92.2 fL (ref 80.0–100.0)
Platelets: 275 10*3/uL (ref 150–400)
RBC: 3.71 MIL/uL — ABNORMAL LOW (ref 3.87–5.11)
RDW: 17 % — ABNORMAL HIGH (ref 11.5–15.5)
WBC: 5.9 10*3/uL (ref 4.0–10.5)
nRBC: 0 % (ref 0.0–0.2)

## 2023-02-15 LAB — TROPONIN I (HIGH SENSITIVITY)
Troponin I (High Sensitivity): 4 ng/L (ref <18)
Troponin I (High Sensitivity): 9 ng/L (ref <18)

## 2023-02-15 LAB — PROTIME-INR
INR: 1.2 (ref 0.8–1.2)
Prothrombin Time: 14.6 seconds (ref 11.4–15.2)

## 2023-02-15 LAB — BASIC METABOLIC PANEL
Anion gap: 8 (ref 5–15)
BUN: 14 mg/dL (ref 8–23)
CO2: 25 mmol/L (ref 22–32)
Calcium: 8.6 mg/dL — ABNORMAL LOW (ref 8.9–10.3)
Chloride: 104 mmol/L (ref 98–111)
Creatinine, Ser: 0.57 mg/dL (ref 0.44–1.00)
GFR, Estimated: >60 mL/min (ref >60)
Glucose, Bld: 112 mg/dL — ABNORMAL HIGH (ref 70–99)
Potassium: 4.5 mmol/L (ref 3.5–5.1)
Sodium: 137 mmol/L (ref 135–145)

## 2023-02-15 LAB — POCT ACTIVATED CLOTTING TIME: Activated Clotting Time: 255 seconds

## 2023-02-15 LAB — CK: Total CK: 27 U/L — ABNORMAL LOW (ref 38–234)

## 2023-02-15 LAB — GLUCOSE, CAPILLARY: Glucose-Capillary: 93 mg/dL (ref 70–99)

## 2023-02-15 SURGERY — CORONARY/GRAFT ACUTE MI REVASCULARIZATION
Anesthesia: Moderate Sedation

## 2023-02-15 MED ORDER — SODIUM CHLORIDE 0.9 % IV SOLN
250.0000 mL | INTRAVENOUS | Status: DC | PRN
  Administered 2023-02-15: 250 mL via INTRAVENOUS

## 2023-02-15 MED ORDER — FERROUS SULFATE 325 (65 FE) MG PO TABS
325.0000 mg | ORAL_TABLET | Freq: Every day | ORAL | Status: DC
  Administered 2023-02-16 – 2023-02-24 (×9): 325 mg via ORAL
  Filled 2023-02-15 (×9): qty 1

## 2023-02-15 MED ORDER — FENTANYL CITRATE (PF) 100 MCG/2ML IJ SOLN
INTRAMUSCULAR | Status: AC
  Filled 2023-02-15: qty 2

## 2023-02-15 MED ORDER — LABETALOL HCL 5 MG/ML IV SOLN: 10.0000 mg | INTRAVENOUS | Status: AC | PRN

## 2023-02-15 MED ORDER — IPRATROPIUM-ALBUTEROL 20-100 MCG/ACT IN AERS: 1.0000 | INHALATION_SPRAY | Freq: Four times a day (QID) | RESPIRATORY_TRACT | Status: DC | PRN

## 2023-02-15 MED ORDER — HEPARIN (PORCINE) IN NACL 1000-0.9 UT/500ML-% IV SOLN
INTRAVENOUS | Status: AC
  Filled 2023-02-15: qty 1000

## 2023-02-15 MED ORDER — ASPIRIN 81 MG PO TBEC
81.0000 mg | DELAYED_RELEASE_TABLET | Freq: Every day | ORAL | Status: DC
  Administered 2023-02-16 – 2023-02-24 (×9): 81 mg via ORAL
  Filled 2023-02-15 (×9): qty 1

## 2023-02-15 MED ORDER — FLUCONAZOLE 50 MG PO TABS
150.0000 mg | ORAL_TABLET | ORAL | Status: AC
  Administered 2023-02-16: 150 mg via ORAL
  Filled 2023-02-15: qty 1

## 2023-02-15 MED ORDER — SODIUM CHLORIDE 0.9% FLUSH
3.0000 mL | INTRAVENOUS | Status: DC | PRN
  Administered 2023-02-17: 3 mL via INTRAVENOUS

## 2023-02-15 MED ORDER — HEPARIN SODIUM (PORCINE) 1000 UNIT/ML IJ SOLN
INTRAMUSCULAR | Status: AC
  Filled 2023-02-15: qty 10

## 2023-02-15 MED ORDER — SODIUM CHLORIDE 0.9 % IV SOLN: INTRAVENOUS | Status: AC

## 2023-02-15 MED ORDER — VITAMIN D (ERGOCALCIFEROL) 1.25 MG (50000 UNIT) PO CAPS: 50000.0000 [IU] | ORAL_CAPSULE | ORAL | Status: DC

## 2023-02-15 MED ORDER — VERAPAMIL HCL 2.5 MG/ML IV SOLN
INTRAVENOUS | Status: AC
  Filled 2023-02-15: qty 2

## 2023-02-15 MED ORDER — VERAPAMIL HCL 2.5 MG/ML IV SOLN
INTRAVENOUS | Status: DC | PRN
  Administered 2023-02-15: 2.5 mg via INTRAVENOUS

## 2023-02-15 MED ORDER — ISOSORBIDE MONONITRATE ER 30 MG PO TB24
90.0000 mg | ORAL_TABLET | Freq: Every day | ORAL | Status: DC
  Administered 2023-02-16 – 2023-02-19 (×4): 90 mg via ORAL
  Filled 2023-02-15 (×4): qty 3

## 2023-02-15 MED ORDER — ATORVASTATIN CALCIUM 20 MG PO TABS
80.0000 mg | ORAL_TABLET | Freq: Every day | ORAL | Status: DC
  Administered 2023-02-15 – 2023-02-23 (×9): 80 mg via ORAL
  Filled 2023-02-15 (×9): qty 4

## 2023-02-15 MED ORDER — ASPIRIN 81 MG PO CHEW
324.0000 mg | CHEWABLE_TABLET | Freq: Once | ORAL | Status: AC
  Administered 2023-02-15: 324 mg via ORAL
  Filled 2023-02-15: qty 4

## 2023-02-15 MED ORDER — VITAMIN C 500 MG PO TABS
500.0000 mg | ORAL_TABLET | Freq: Two times a day (BID) | ORAL | Status: DC
  Administered 2023-02-15 – 2023-02-24 (×18): 500 mg via ORAL
  Filled 2023-02-15 (×18): qty 1

## 2023-02-15 MED ORDER — PANTOPRAZOLE SODIUM 40 MG PO TBEC
40.0000 mg | DELAYED_RELEASE_TABLET | Freq: Every day | ORAL | Status: DC
  Administered 2023-02-15 – 2023-02-16 (×2): 40 mg via ORAL
  Filled 2023-02-15 (×2): qty 1

## 2023-02-15 MED ORDER — OMADACYCLINE TOSYLATE 150 MG PO TABS: 300.0000 mg | ORAL_TABLET | Freq: Every day | ORAL | Status: DC

## 2023-02-15 MED ORDER — CHLORHEXIDINE GLUCONATE CLOTH 2 % EX PADS
6.0000 | MEDICATED_PAD | Freq: Every day | CUTANEOUS | Status: DC
  Administered 2023-02-15 – 2023-02-24 (×9): 6 via TOPICAL

## 2023-02-15 MED ORDER — OXYCODONE-ACETAMINOPHEN 5-325 MG PO TABS: 1.0000 | ORAL_TABLET | ORAL | Status: DC | PRN

## 2023-02-15 MED ORDER — ONDANSETRON HCL 4 MG/2ML IJ SOLN
4.0000 mg | Freq: Four times a day (QID) | INTRAMUSCULAR | Status: DC | PRN
  Administered 2023-02-16: 4 mg via INTRAVENOUS
  Filled 2023-02-15: qty 2

## 2023-02-15 MED ORDER — HYDRALAZINE HCL 20 MG/ML IJ SOLN: 10.0000 mg | INTRAMUSCULAR | Status: AC | PRN

## 2023-02-15 MED ORDER — SODIUM CHLORIDE 0.9% FLUSH
3.0000 mL | Freq: Two times a day (BID) | INTRAVENOUS | Status: DC
  Administered 2023-02-16 – 2023-02-24 (×14): 3 mL via INTRAVENOUS

## 2023-02-15 MED ORDER — ACETAMINOPHEN 325 MG PO TABS: 650.0000 mg | ORAL_TABLET | ORAL | Status: DC | PRN

## 2023-02-15 MED ORDER — OXYCODONE HCL 5 MG PO TABS
5.0000 mg | ORAL_TABLET | ORAL | Status: DC | PRN
  Administered 2023-02-15: 5 mg via ORAL
  Filled 2023-02-15: qty 1

## 2023-02-15 MED ORDER — METOPROLOL TARTRATE 25 MG PO TABS
12.5000 mg | ORAL_TABLET | Freq: Two times a day (BID) | ORAL | Status: DC
  Administered 2023-02-15 – 2023-02-16 (×2): 12.5 mg via ORAL
  Filled 2023-02-15 (×2): qty 1

## 2023-02-15 MED ORDER — HEPARIN SODIUM (PORCINE) 1000 UNIT/ML IJ SOLN
INTRAMUSCULAR | Status: DC | PRN
  Administered 2023-02-15: 8000 [IU] via INTRAVENOUS

## 2023-02-15 MED ORDER — VITAMIN B-12 1000 MCG PO TABS
1000.0000 ug | ORAL_TABLET | Freq: Every day | ORAL | Status: DC
  Administered 2023-02-16 – 2023-02-24 (×9): 1000 ug via ORAL
  Filled 2023-02-15 (×9): qty 1

## 2023-02-15 MED ORDER — HYDROCODONE-ACETAMINOPHEN 10-325 MG PO TABS
1.0000 | ORAL_TABLET | ORAL | Status: DC | PRN
  Administered 2023-02-15 – 2023-02-18 (×14): 1 via ORAL
  Filled 2023-02-15 (×14): qty 1

## 2023-02-15 MED ORDER — PREGABALIN 75 MG PO CAPS
200.0000 mg | ORAL_CAPSULE | Freq: Three times a day (TID) | ORAL | Status: DC
  Administered 2023-02-16 – 2023-02-24 (×25): 200 mg via ORAL
  Filled 2023-02-15 (×8): qty 1
  Filled 2023-02-15: qty 2
  Filled 2023-02-15 (×16): qty 1

## 2023-02-15 MED ORDER — FENTANYL CITRATE PF 50 MCG/ML IJ SOSY
25.0000 ug | PREFILLED_SYRINGE | Freq: Once | INTRAMUSCULAR | Status: AC
  Administered 2023-02-15: 25 ug via INTRAVENOUS
  Filled 2023-02-15: qty 1

## 2023-02-15 MED ORDER — MIDAZOLAM HCL 2 MG/2ML IJ SOLN
INTRAMUSCULAR | Status: AC
  Filled 2023-02-15: qty 2

## 2023-02-15 MED ORDER — HEPARIN (PORCINE) 25000 UT/250ML-% IV SOLN
1550.0000 [IU]/h | INTRAVENOUS | Status: DC
  Administered 2023-02-15: 800 [IU]/h via INTRAVENOUS
  Administered 2023-02-16 – 2023-02-18 (×3): 1200 [IU]/h via INTRAVENOUS
  Administered 2023-02-19: 1350 [IU]/h via INTRAVENOUS
  Administered 2023-02-20 (×2): 1550 [IU]/h via INTRAVENOUS
  Filled 2023-02-15 (×7): qty 250

## 2023-02-15 MED ORDER — POLYETHYLENE GLYCOL 3350 17 G PO PACK
17.0000 g | PACK | Freq: Every day | ORAL | Status: DC
  Administered 2023-02-17 – 2023-02-20 (×4): 17 g via ORAL
  Filled 2023-02-15 (×5): qty 1

## 2023-02-15 MED ORDER — SODIUM CHLORIDE 0.9% FLUSH
3.0000 mL | Freq: Two times a day (BID) | INTRAVENOUS | Status: DC
  Administered 2023-02-15 – 2023-02-24 (×18): 3 mL via INTRAVENOUS

## 2023-02-15 MED ORDER — HYDROMORPHONE HCL 1 MG/ML IJ SOLN
1.0000 mg | INTRAMUSCULAR | Status: DC | PRN
  Administered 2023-02-15 – 2023-02-22 (×33): 1 mg via INTRAVENOUS
  Filled 2023-02-15 (×35): qty 1

## 2023-02-15 MED ORDER — IOHEXOL 300 MG/ML  SOLN
INTRAMUSCULAR | Status: DC | PRN
  Administered 2023-02-15: 85 mL

## 2023-02-15 MED ORDER — METRONIDAZOLE 500 MG PO TABS: 500.0000 mg | ORAL_TABLET | Freq: Two times a day (BID) | ORAL | Status: DC

## 2023-02-15 MED ORDER — BISACODYL 5 MG PO TBEC
5.0000 mg | DELAYED_RELEASE_TABLET | Freq: Every day | ORAL | Status: DC | PRN
  Administered 2023-02-18 – 2023-02-20 (×2): 5 mg via ORAL
  Filled 2023-02-15 (×2): qty 1

## 2023-02-15 MED ORDER — IPRATROPIUM-ALBUTEROL 0.5-2.5 (3) MG/3ML IN SOLN: 3.0000 mL | Freq: Four times a day (QID) | RESPIRATORY_TRACT | Status: DC | PRN

## 2023-02-15 MED ORDER — FENTANYL CITRATE (PF) 100 MCG/2ML IJ SOLN
INTRAMUSCULAR | Status: DC | PRN
  Administered 2023-02-15: 25 ug via INTRAVENOUS

## 2023-02-15 MED ORDER — VITAMIN D (ERGOCALCIFEROL) 1.25 MG (50000 UNIT) PO CAPS
50000.0000 [IU] | ORAL_CAPSULE | ORAL | Status: DC
  Administered 2023-02-16 – 2023-02-23 (×2): 50000 [IU] via ORAL
  Filled 2023-02-15 (×2): qty 1

## 2023-02-15 MED ORDER — NITROGLYCERIN 1 MG/10 ML FOR IR/CATH LAB
INTRA_ARTERIAL | Status: DC | PRN
  Administered 2023-02-15: 200 ug

## 2023-02-15 MED ORDER — ORAL CARE MOUTH RINSE: 15.0000 mL | OROMUCOSAL | Status: DC | PRN

## 2023-02-15 MED ORDER — OMADACYCLINE TOSYLATE 150 MG PO TABS
300.0000 mg | ORAL_TABLET | Freq: Every day | ORAL | Status: AC
  Administered 2023-02-16 – 2023-02-20 (×5): 300 mg via ORAL
  Filled 2023-02-15 (×4): qty 2

## 2023-02-15 MED ORDER — HEPARIN (PORCINE) IN NACL 2000-0.9 UNIT/L-% IV SOLN
INTRAVENOUS | Status: DC | PRN
  Administered 2023-02-15: 1000 mL

## 2023-02-15 MED ORDER — ENSURE ENLIVE PO LIQD
237.0000 mL | Freq: Two times a day (BID) | ORAL | Status: DC
  Administered 2023-02-15 – 2023-02-20 (×6): 237 mL via ORAL

## 2023-02-15 MED ORDER — LIDOCAINE HCL (PF) 1 % IJ SOLN
INTRAMUSCULAR | Status: DC | PRN
  Administered 2023-02-15: 2 mL via SUBCUTANEOUS

## 2023-02-15 MED ORDER — SODIUM CHLORIDE 0.9 % IV BOLUS
500.0000 mL | Freq: Once | INTRAVENOUS | Status: AC
  Administered 2023-02-15: 500 mL via INTRAVENOUS

## 2023-02-15 MED ORDER — LIDOCAINE 5 % EX PTCH
1.0000 | MEDICATED_PATCH | CUTANEOUS | Status: DC
  Administered 2023-02-15 – 2023-02-23 (×7): 1 via TRANSDERMAL
  Filled 2023-02-15 (×10): qty 1

## 2023-02-15 MED ORDER — ZINC SULFATE 220 (50 ZN) MG PO CAPS
220.0000 mg | ORAL_CAPSULE | Freq: Every day | ORAL | Status: DC
  Administered 2023-02-16 – 2023-02-24 (×9): 220 mg via ORAL
  Filled 2023-02-15 (×9): qty 1

## 2023-02-15 MED ORDER — ISOSORBIDE MONONITRATE ER 30 MG PO TB24: 120.0000 mg | ORAL_TABLET | Freq: Every day | ORAL | Status: DC

## 2023-02-15 SURGICAL SUPPLY — 15 items
CATH 5F 110X4 TIG (CATHETERS) IMPLANT
CATH INFINITI 5FR ANG PIGTAIL (CATHETERS) IMPLANT
CATH LAUNCHER 6FR JR4 (CATHETERS) IMPLANT
DEVICE RAD TR BAND REGULAR (VASCULAR PRODUCTS) IMPLANT
DRAPE FEMORAL ANGIO W/ POUCH (DRAPES) IMPLANT
GLIDESHEATH SLEND SS 6F .021 (SHEATH) IMPLANT
GUIDEWIRE INQWIRE 1.5J.035X260 (WIRE) IMPLANT
INQWIRE 1.5J .035X260CM (WIRE) ×1
KIT ENCORE 26 ADVANTAGE (KITS) IMPLANT
PACK CARDIAC CATH (CUSTOM PROCEDURE TRAY) ×1 IMPLANT
PROTECTION STATION PRESSURIZED (MISCELLANEOUS) ×1
SET ATX-X65L (MISCELLANEOUS) IMPLANT
STATION PROTECTION PRESSURIZED (MISCELLANEOUS) IMPLANT
TUBING CIL FLEX 10 FLL-RA (TUBING) IMPLANT
WIRE ASAHI PROWATER 180CM (WIRE) IMPLANT

## 2023-02-15 NOTE — Assessment & Plan Note (Addendum)
Continue home bronchodilators with DuoNebs as needed.

## 2023-02-15 NOTE — Progress Notes (Signed)
Patient alert with complaints of left leg pain. Pain medication given. Dr. Huel Cote in room assessing patient when admitted to unit. MD made aware patient has no (/palpable/doppler) pulse in the left leg and unable to doppler right groin. Patient is tolerating diet, pure-wick intact. Continue to assess.

## 2023-02-15 NOTE — Assessment & Plan Note (Addendum)
-   Cardizem if blood pressure allows. - Anticoagulation with heparin

## 2023-02-15 NOTE — Assessment & Plan Note (Addendum)
Patient appears euvolemic at this time.

## 2023-02-15 NOTE — Consult Note (Signed)
Hospital Consult    Reason for Consult:  Left Lower extremity Ischemia Requesting Physician:  Dr Imagene Gurney MD MRN #:  [id number]  History of Present Illness: This is a 62 y.o. female with medical history significant of severe PAD s/p right AKA, CAD, HFpEF, hypertension, hyperlipidemia, paroxysmal atrial fibrillation on Eliquis, SMA thrombosis, who presents to the ED due to leg pain.  Patient states she has been experiencing left lower extremity pain for the last 1 week, however it suddenly worsened this morning and that is why she called EMS.  She endorses chronic numbness and tingling that is unchanged.  In addition, she has been experiencing intermittent chest pain that occurs at rest and self resolves.  She denies any shortness of breath or palpitations.   Past Medical History:  Diagnosis Date   Abdominal aortic atherosclerosis    a. 05/2017 CTA abd/pelvis: significant atherosclerotic dzs of the infrarenal abd Ao w/ some mural thrombus. No aneurysm or dissection.   Acute focal ischemia of small intestine    Acute right-sided low back pain with right-sided sciatica 06/13/2017   AKI (acute kidney injury) 07/29/2017   Anemia    Anginal pain    a. 08/2012 Lexiscan MV: EF 54%, non ischemia/infarct.   Anxiety and depression    Baker's cyst of knee, right    a. 07/2016 U/S: 4.1 x 1.4 x 2.9 cystic structure in R poplitetal fossa.   Bell's palsy    CAD (coronary artery disease)    CHF (congestive heart failure)    a.) TTE 06/20/2017: EF 60-65%, no rwma, G1DD, no source of cardiac emboli.   Chronic anticoagulation    a.) apixaban   Chronic idiopathic constipation    Chronic mesenteric ischemia    Chronic pain    COPD (chronic obstructive pulmonary disease)    Dyspnea    Embolus of superior mesenteric artery (HCC)    a. 05/2017 CTA Abd/pelvis: apparent thrombus or embolus in prox SMA (70-90%); b. 05/2017 catheter directed tPA, mechanical thrombectomy, and stenting of the SMA.    Essential hypertension with goal blood pressure less than 140/90 01/19/2016   Gastroesophageal reflux disease 01/19/2016   GERD (gastroesophageal reflux disease)    H/O colectomy    History of 2019 novel coronavirus disease (COVID-19) 11/14/2019   History of kidney stones    Hyperlipidemia    Hypertension    Morbid obesity with BMI of 40.0-44.9, adult    NSTEMI (non-ST elevated myocardial infarction) 06/27/2021   a.) high sensitivity troponins trended: 893 --> 1587 --> 1748 --> 868 --> 735 ng/L   Occlusive mesenteric ischemia 06/19/2017   Osteoarthritis    PAD (peripheral artery disease)    PAF (paroxysmal atrial fibrillation)    a.) CHA2DS2-VASc = 6 (sex, CHF, HTN, DVT x2, aortic plaque). b.) rate/rhythm maintained on oral metoprolol tartrate; chronically anticoagulated with standard dose apixaban.   Palpitations    Peripheral edema    Personal history of other malignant neoplasm of skin 06/01/2016   Pneumonia    Pre-diabetes    Primary osteoarthritis of both knees 06/13/2017   SBO (small bowel obstruction) 08/06/2017   Skin cancer of nose    Superior mesenteric artery thrombosis    Vitamin D deficiency     Past Surgical History:  Procedure Laterality Date   AMPUTATION Right 04/06/2022   Procedure: AMPUTATION ABOVE KNEE;  Surgeon: Annice Needy, MD;  Location: [facility];  Service: Vascular;  Laterality: Right;   AMPUTATION  12/07/2022   Procedure: AMPUTATION ABOVE  KNEE REVISION WITH RESECTION OF FEMUR;  Surgeon: Annice Needy, MD;  Location: [facility];  Service: Vascular;;   AMPUTATION TOE Left 12/09/2019   Procedure: AMPUTATION TOE MPJ left;  Surgeon: Rosetta Posner, DPM;  Location: [facility];  Service: Podiatry;  Laterality: Left;   APPENDECTOMY     APPLICATION OF WOUND VAC  12/01/2021   Procedure: APPLICATION OF WOUND VAC;  Surgeon: Annice Needy, MD;  Location: [facility];  Service: Vascular;;   APPLICATION OF WOUND VAC Right 05/03/2022   Procedure: APPLICATION OF WOUND VAC;  Surgeon:  Annice Needy, MD;  Location: [facility];  Service: Vascular;  Laterality: Right;   APPLICATION OF WOUND VAC Left 07/23/2022   Procedure: APPLICATION OF WOUND VAC;  Surgeon: Nada Libman, MD;  Location: [facility];  Service: Vascular;  Laterality: Left;  Prevena   APPLICATION OF WOUND VAC Right 08/28/2022   Procedure: APPLICATION OF WOUND VAC;  Surgeon: Annice Needy, MD;  Location: [facility];  Service: Vascular;  Laterality: Right;   APPLICATION OF WOUND VAC  12/07/2022   Procedure: APPLICATION OF WOUND VAC;  Surgeon: Annice Needy, MD;  Location: [facility];  Service: Vascular;;   AXILLARY-FEMORAL BYPASS GRAFT Left 07/23/2022   Procedure: BYPASS GRAFT AXILLA-BIFEMORAL;  Surgeon: Nada Libman, MD;  Location: [facility];  Service: Vascular;  Laterality: Left;   CHOLECYSTECTOMY     COLON SURGERY     COLONOSCOPY WITH PROPOFOL N/A 03/23/2022   Procedure: COLONOSCOPY WITH PROPOFOL;  Surgeon: Wyline Mood, MD;  Location: [facility];  Service: Gastroenterology;  Laterality: N/A;  rectal bleed   EMBOLECTOMY OR THROMBECTOMY, WITH OR WITHOUT CATHETER; FEMOROPOPLITEAL, AORTOILIAC ARTERY, BY LEG INCISION N/A 06/21/2020   Left - Decomp Fasciotomy Leg; Ant &/Or Lat Compart Only; Midline - Angiography, Visceral, Selective Or Supraselective (With Or Without Flush Aortogram); Left - Revascularize, Endovasc, Open/Percut, Iliac Artery, Unilat, Initial Vessel; W/Translum Stent, W/Angioplasty; Right - Revascularize, Endovasc, Open/Percut, Iliac Artery, Ea Add`L Ipsilateral; W/Translumin Stent, W/Angioplasty; Location [facility];   ENDARTERECTOMY FEMORAL Left 12/01/2021   Procedure: ENDARTERECTOMY FEMORAL;  Surgeon: Annice Needy, MD;  Location: [facility];  Service: Vascular;  Laterality: Left;   ENDOVASCULAR REPAIR/STENT GRAFT Left 12/01/2021   Procedure: ENDOVASCULAR REPAIR/STENT GRAFT;  Surgeon: Annice Needy, MD;  Location: [facility];  Service: Cardiovascular;  Laterality: Left;   INCISION AND DRAINAGE OF WOUND Right  05/03/2022   Procedure: IRRIGATION AND DEBRIDEMENT RIGHT STUMP;  Surgeon: Annice Needy, MD;  Location: [facility];  Service: Vascular;  Laterality: Right;   IRRIGATION AND DEBRIDEMENT ABSCESS Right 08/28/2022   Procedure: AKA IRRIGATION AND DEBRIDEMENT ABSCESS;  Surgeon: Annice Needy, MD;  Location: [facility];  Service: Vascular;  Laterality: Right;   LAPAROTOMY N/A 08/08/2017   Procedure: EXPLORATORY LAPAROTOMY POSSIBLE BOWEL RESECTION;  Surgeon: Leafy Ro, MD;  Location: [facility];  Service: General;  Laterality: N/A;   LEFT HEART CATH AND CORONARY ANGIOGRAPHY N/A 05/17/2021   Procedure: LEFT HEART CATH AND CORONARY ANGIOGRAPHY;  Surgeon: Alwyn Pea, MD;  Location: [facility];  Service: Cardiovascular;  Laterality: N/A;   LOWER EXTREMITY ANGIOGRAPHY Left 11/17/2019   Procedure: LOWER EXTREMITY ANGIOGRAPHY;  Surgeon: Annice Needy, MD;  Location: [facility];  Service: Cardiovascular;  Laterality: Left;   LOWER EXTREMITY ANGIOGRAPHY Right 12/13/2020   Procedure: LOWER EXTREMITY ANGIOGRAPHY;  Surgeon: Annice Needy, MD;  Location: [facility];  Service: Cardiovascular;  Laterality: Right;   LOWER EXTREMITY  ANGIOGRAPHY Right 01/10/2021   Procedure: LOWER EXTREMITY ANGIOGRAPHY;  Surgeon: Annice Needy, MD;  Location: [facility];  Service: Cardiovascular;  Laterality: Right;   LOWER EXTREMITY ANGIOGRAPHY Right 01/24/2021   Procedure: LOWER EXTREMITY ANGIOGRAPHY;  Surgeon: Annice Needy, MD;  Location: [facility];  Service: Cardiovascular;  Laterality: Right;   LOWER EXTREMITY ANGIOGRAPHY Bilateral 06/28/2021   Procedure: Lower Extremity Angiography;  Surgeon: Annice Needy, MD;  Location: [facility];  Service: Cardiovascular;  Laterality: Bilateral;   LOWER EXTREMITY ANGIOGRAPHY Right 11/28/2021   Procedure: LOWER EXTREMITY ANGIOGRAPHY;  Surgeon: Annice Needy, MD;  Location: [facility];  Service: Cardiovascular;  Laterality: Right;    LOWER EXTREMITY ANGIOGRAPHY Left 11/30/2021   Procedure: Lower Extremity Angiography;  Surgeon: Annice Needy, MD;  Location: [facility];  Service: Cardiovascular;  Laterality: Left;   LOWER EXTREMITY ANGIOGRAPHY Right 02/22/2022   Procedure: Lower Extremity Angiography;  Surgeon: Annice Needy, MD;  Location: [facility];  Service: Cardiovascular;  Laterality: Right;   LOWER EXTREMITY ANGIOGRAPHY Right 03/13/2022   Procedure: Lower Extremity Angiography;  Surgeon: Annice Needy, MD;  Location: [facility];  Service: Cardiovascular;  Laterality: Right;   LOWER EXTREMITY ANGIOGRAPHY Right 03/24/2022   Procedure: Lower Extremity Angiography;  Surgeon: Annice Needy, MD;  Location: [facility];  Service: Cardiovascular;  Laterality: Right;   LOWER EXTREMITY ANGIOGRAPHY Left 01/17/2023   Procedure: Lower Extremity Angiography;  Surgeon: Annice Needy, MD;  Location: [facility];  Service: Cardiovascular;  Laterality: Left;   LOWER EXTREMITY INTERVENTION N/A 11/19/2019   Procedure: LOWER EXTREMITY INTERVENTION;  Surgeon: Annice Needy, MD;  Location: [facility];  Service: Cardiovascular;  Laterality: N/A;   LOWER EXTREMITY INTERVENTION Bilateral 06/29/2021   Procedure: LOWER EXTREMITY INTERVENTION;  Surgeon: Annice Needy, MD;  Location: [facility];  Service: Cardiovascular;  Laterality: Bilateral;   LOWER EXTREMITY INTERVENTION Left 01/18/2023   Procedure: LOWER EXTREMITY INTERVENTION;  Surgeon: Annice Needy, MD;  Location: [facility];  Service: Cardiovascular;  Laterality: Left;   TEE WITHOUT CARDIOVERSION N/A 06/22/2017   Procedure: TRANSESOPHAGEAL ECHOCARDIOGRAM (TEE);  Surgeon: Iran Ouch, MD;  Location: [facility];  Service: Cardiovascular;  Laterality: N/A;   TOTAL KNEE ARTHROPLASTY Right 07/11/2018   Procedure: TOTAL KNEE ARTHROPLASTY;  Surgeon: Christena Flake, MD;  Location: [facility];  Service: Orthopedics;  Laterality: Right;    VAGINAL HYSTERECTOMY     VISCERAL ARTERY INTERVENTION N/A 06/20/2017   Procedure: Visceral Artery Intervention, possible aortic thrombectomy;  Surgeon: Annice Needy, MD;  Location: [facility];  Service: Cardiovascular;  Laterality: N/A;   VISCERAL ARTERY INTERVENTION N/A 01/28/2018   Procedure: VISCERAL ARTERY INTERVENTION;  Surgeon: Annice Needy, MD;  Location: [facility];  Service: Cardiovascular;  Laterality: N/A;    Allergies  Allergen Reactions   Gabapentin     Tremors    Bactrim [Sulfamethoxazole-Trimethoprim] Itching    Prior to Admission medications   Medication Sig Start Date End Date Taking? Authorizing Provider  alum & mag hydroxide-simeth (MAALOX/MYLANTA) 200-200-20 MG/5ML suspension Take 15-30 mLs by mouth every 2 (two) hours as needed for indigestion. 08/11/22   Arnetha Courser, MD  apixaban (ELIQUIS) 5 MG TABS tablet Take 1 tablet (5 mg total) by mouth 2 (two) times daily. 01/25/21   Stegmayer, Ranae Plumber, PA-C  aspirin EC 81 MG tablet Take 81  mg by mouth daily. Swallow whole.    [provider]  atorvastatin (LIPITOR) 80 MG tablet Take 80 mg by mouth at bedtime.    [provider]  bisacodyl (DULCOLAX) 5 MG EC tablet Take 1 tablet (5 mg total) by mouth daily as needed for moderate constipation. 09/08/22   Sunnie Nielsen, DO  citalopram (CELEXA) 40 MG tablet Take 1 tablet (40 mg total) by mouth daily. 02/04/22 09/07/22  Darlin Priestly, MD  COMBIVENT RESPIMAT 20-100 MCG/ACT AERS respimat Inhale 1 puff into the lungs every 6 (six) hours as needed. 03/27/22   [provider]  esomeprazole (NEXIUM) 20 MG capsule Take 20 mg by mouth daily. 07/06/22   [provider]  feeding supplement (ENSURE ENLIVE / ENSURE PLUS) LIQD Take 237 mLs by mouth 2 (two) times daily between meals. 05/06/22   [name], MD  ferrous sulfate 325 (65 FE) MG tablet Take 325 mg by mouth daily.    [provider]  fluconazole (DIFLUCAN) 150 MG tablet  Take 150 mg by mouth every 3 (three) days. 01/08/23   [provider]  HYDROcodone-acetaminophen (NORCO) 10-325 MG tablet Take 1 tablet by mouth every 4 (four) hours as needed. 01/07/23   [provider]  isosorbide mononitrate (IMDUR) 60 MG 24 hr tablet Take 60 mg by mouth daily. 01/23/22   [provider]  lidocaine (LIDODERM) 5 % Place 1 patch onto the skin daily. Remove & Discard patch within 12 hours or as directed by MD 08/11/22   Arnetha Courser, MD  metoprolol tartrate (LOPRESSOR) 25 MG tablet Take 0.5 tablets (12.5 mg total) by mouth 2 (two) times daily. Hold if SBP is 100 or less or HR is less than 60. Notify Provider Patient taking differently: Take 50 mg by mouth 2 (two) times daily. Hold if SBP is 100 or less or HR is less than 60. Notify Provider 09/08/22   Sunnie Nielsen, DO  metroNIDAZOLE (FLAGYL) 500 MG tablet Take 1 tablet (500 mg total) by mouth 2 (two) times daily. 01/05/23   Lynn Ito, MD  Omadacycline Tosylate (NUZYRA) 150 MG TABS Take 2 tablets (300 mg total) by mouth daily. 01/24/23   Lynn Ito, MD  oxyCODONE (ROXICODONE) 5 MG immediate release tablet Take 1-2 tablets (5-10 mg total) by mouth every 4 (four) hours as needed for severe pain. 01/19/23   Chae Oommen, Peggye Ley, NP  oxyCODONE-acetaminophen (PERCOCET/ROXICET) 5-325 MG tablet Take 1-2 tablets by mouth every 4 (four) hours as needed for severe pain. 12/13/22   Marrion Coy, MD  polyethylene glycol (MIRALAX / GLYCOLAX) 17 g packet Take 17 g by mouth daily. 08/12/22   Arnetha Courser, MD  pregabalin (LYRICA) 200 MG capsule Take 1 capsule (200 mg total) by mouth in the morning, at noon, and at bedtime. 01/31/23 05/31/23  Georgiana Spinner, NP    Social History   Socioeconomic History   Marital status: Single    Spouse name: Not on file   Number of children: Not on file   Years of education: Not on file   Highest education level: Not on file  Occupational History   Occupation:  Unemployed  Tobacco Use   Smoking status: Former    Packs/day: 1    Types: Cigarettes    Quit date: 2023    Years since quitting: 1.3   Smokeless tobacco: Never  Vaping Use   Vaping Use: Never used  Substance and Sexual Activity   Alcohol use: No   Drug use: No  Sexual activity: Not on file  Other Topics Concern   Not on file  Social History Narrative   Lives in [place] with daughter   Social Determinants of Health   Financial Resource Strain: Not on file  Food Insecurity: No Food Insecurity (01/17/2023)   Hunger Vital Sign    Worried About Running Out of Food in the Last Year: Never true    Ran Out of Food in the Last Year: Never true  Transportation Needs: No Transportation Needs (01/17/2023)   PRAPARE - Administrator, Civil Service (Medical): No    Lack of Transportation (Non-Medical): No  Physical Activity: Not on file  Stress: Not on file  Social Connections: Not on file  Intimate Partner Violence: Not At Risk (01/17/2023)   Humiliation, Afraid, Rape, and Kick questionnaire    Fear of Current or Ex-Partner: No    Emotionally Abused: No    Physically Abused: No    Sexually Abused: No     Family History  Problem Relation Age of Onset   Hypertension Mother    Heart disease Mother    Heart attack Mother    Breast cancer Mother    Heart disease Father    Hypertension Father    Breast cancer Paternal Aunt     ROS: Otherwise negative unless mentioned in HPI  Physical Examination  Vitals:   02/15/23 1118 02/15/23 1130  BP:  100/64  Pulse:  65  Resp:  18  Temp:  98 F (36.7 C)  SpO2: 96% 96%   Body mass index is 39.31 kg/m.  General:  WDWN in NAD Gait: Not observed HENT: WNL, normocephalic Pulmonary: normal non-labored breathing, without Rales, rhonchi,  wheezing Cardiac: regular, without  Murmurs, rubs or gallops; without carotid bruits Abdomen: Positive bowel sounds with positive hernia, soft, NT/ND, no masses Skin: without  rashes Vascular Exam/Pulses: Rt AKA, Left lower extremity without pulses femoral, popliteal, Post tibial and dorsalis pedis.  Extremities: with ischemic changes, without Gangrene , without cellulitis; without open wounds;  Musculoskeletal: no muscle wasting or atrophy  Neurologic: A&O X 3;  No focal weakness or paresthesias are detected; speech is fluent/normal Psychiatric:  The pt has Normal affect. Lymph:  Unremarkable  CBC    Component Value Date/Time   WBC 3.2 (L) 01/19/2023 0425   RBC 3.13 (L) 01/19/2023 0425   HGB 8.8 (L) 01/19/2023 0425   HGB 13.4 02/12/2015 1137   HCT 27.7 (L) 01/19/2023 0425   HCT 40.9 02/12/2015 1137   PLT 137 (L) 01/19/2023 0425   PLT 272 02/12/2015 1137   MCV 88.5 01/19/2023 0425   MCV 90 02/12/2015 1137   MCH 28.1 01/19/2023 0425   MCHC 31.8 01/19/2023 0425   RDW 18.2 (H) 01/19/2023 0425   RDW 14.8 (H) 02/12/2015 1137   LYMPHSABS 1.6 01/04/2023 1216   LYMPHSABS 1.7 08/27/2014 1145   MONOABS 0.4 01/04/2023 1216   MONOABS 0.4 08/27/2014 1145   EOSABS 0.3 01/04/2023 1216   EOSABS 0.0 08/27/2014 1145   BASOSABS 0.0 01/04/2023 1216   BASOSABS 0.0 08/27/2014 1145    BMET    Component Value Date/Time   NA 140 01/19/2023 0425   NA 140 02/12/2015 1137   K 3.5 01/19/2023 0425   K 2.9 (L) 02/12/2015 1137   CL 111 01/19/2023 0425   CL 107 02/12/2015 1137   CO2 24 01/19/2023 0425   CO2 27 02/12/2015 1137   GLUCOSE 115 (H) 01/19/2023 0425   GLUCOSE 120 (H)  02/12/2015 1137   BUN 7 (L) 01/19/2023 0425   BUN 15 02/12/2015 1137   CREATININE 0.56 01/19/2023 0425   CREATININE 0.78 02/12/2015 1137   CALCIUM 8.0 (L) 01/19/2023 0425   CALCIUM 9.0 02/12/2015 1137   GFRNONAA >60 01/19/2023 0425   GFRNONAA >60 02/12/2015 1137   GFRAA >60 11/23/2019 1330   GFRAA >60 02/12/2015 1137    COAGS: Lab Results  Component Value Date   INR 1.1 12/04/2022   INR 1.1 12/03/2022   INR 1.3 (H) 08/26/2022     Non-Invasive Vascular Imaging:   NONE  Statin:   Yes.   Beta Blocker:  Yes.   Aspirin:  Yes.   ACEI:  Yes.   ARB:  No. CCB use:  No Other antiplatelets/anticoagulants:  Yes.   Eliquis 5 mg BID   ASSESSMENT/PLAN: This is a 62 y.o. female well-known to vascular surgery. Ms Shor underwent axillary to femoral bypass graft back in 07/23/2022.  On 01/17/2023 patient underwent left lower extremity angiogram with angioplasty and stent placement to restore blood flow.  He is now less than 1 month since his procedure and she has occluded again.  This occlusion occurs while she has been on daily aspirin 81 mg and Eliquis 5 mg twice daily.  On exam today I was unable to get Doppler pulses in her left femoral artery, left popliteal artery, left posttibial artery, and left dorsalis pedis artery.  Patient's lower extremity is painful to palpation and cool to touch.  I discussed the case in detail with Dr. Festus Barren MD.  At this point there does not seem to be a revascularization option.  It would be very difficult if at all possible to access the prior bypass graft.  She has now occluded this bypass graft in less than a month while being on aspirin and Eliquis.  At this time vascular surgery recommends left above-the-knee amputation.  We will discuss with the patient and schedule for sometime next week.   -I discussed the case in detail with Dr. Festus Barren MD and he agrees with the plan   [name] [facility] 02/15/2023 11:56 AM

## 2023-02-15 NOTE — Assessment & Plan Note (Addendum)
Extensive history of PAD, with prior right AKA (June 2023) with recent stump revision (February 2024), and left femoral bypass mechanical thrombectomy (March 2024). Patient is presenting with 1 week of left lower extremity. - Oral and IV pain medications -Vascular team to set up for amputation.

## 2023-02-15 NOTE — Consult Note (Addendum)
Cardiology Consultation   Patient ID: Carla [name] MRN: [id number]; DOB: [birth year]  Admit date: 02/15/2023 Date of Consult: 02/15/2023  PCP:  Terri Piedra, PA-C   [facility] Providers-none Cardiologist: Dr. Juliann Pares      Patient Profile:   Carla [name] is a 62 y.o. female with a hx of significant peripheral vascular disease status post right AKA and left axillofemoral bypass with moderate CAD who is being seen 02/15/2023 for the evaluation of ST elevations on EKG with intermittent chest pain at the request of Dr. Allayne Stack.  History of Present Illness:   Carla [name] was brought in via EMS to the [facility] ER with complaint of severe left leg/foot pain.  EKG by EMS in transit showed subtle inferior ST elevations with no reciprocal changes, and the patient had not complained of chest pain.  There was no concern for this being acute STEMI based on clinical presentation.  EKG persisted with inferior changes on EKG and after extensive questioning the patient did mention having some intermittent chest discomfort over the last few days to the ER physician.  I was asked to consult on the ER based on the EKG changes and her being a vasculopath.  On my initial evaluation she was somewhat lethargic and borderline sedated having been given some fentanyl, and was being evaluated by Carla Plate, NP from Vascular Surgery who was confirming that she has minimal pulses in the pedal and popliteal arteries.  When I spoke to her she indicated that she has been having off-and-on chest comfort for 3 days that spontaneously resolves.  It can last anywhere to an hour.  Her troponin levels were negative, but she does have subtle 1 mm ST elevations in inferior leads but no reciprocal changes.  However she did mention to me that currently was having 7 out of 10 chest pain.  She did not meet criteria for true STEMI, however knowing her anatomy with proximal RCA lesion and ongoing chest pain with subtle ST changes  and likely potential urgent vascular procedure, I chose to bring the patient to the Cath Lab as an urgent STEMI patient to ensure that that we did not miss a possible early STEMI.  There was already a built in delay based on her very atypical presentation, and no complaint of chest pain until I saw her in ER, the diagnosis of possible STEMI was not until my evaluation just prior to STEMI activation.  We then had to wait for the active cardiac cath procedure being performed to complete and for the room to be prepped.  This led to an additional 25 to 30-minute delay.   Past Medical History:  Diagnosis Date   Abdominal aortic atherosclerosis    a. 05/2017 CTA abd/pelvis: significant atherosclerotic dzs of the infrarenal abd Ao w/ some mural thrombus. No aneurysm or dissection.   Acute focal ischemia of small intestine    Acute right-sided low back pain with right-sided sciatica 06/13/2017   AKI (acute kidney injury) 07/29/2017   Anemia    Anginal pain    a. 08/2012 Lexiscan MV: EF 54%, non ischemia/infarct.   Anxiety and depression    Baker's cyst of knee, right    a. 07/2016 U/S: 4.1 x 1.4 x 2.9 cystic structure in R poplitetal fossa.   Bell's palsy    CAD (coronary artery disease)    CHF (congestive heart failure)    a.) TTE 06/20/2017: EF 60-65%, no rwma, G1DD, no source of cardiac emboli.  Chronic anticoagulation    a.) apixaban   Chronic idiopathic constipation    Chronic mesenteric ischemia    Chronic pain    COPD (chronic obstructive pulmonary disease)    Dyspnea    Embolus of superior mesenteric artery (HCC)    a. 05/2017 CTA Abd/pelvis: apparent thrombus or embolus in prox SMA (70-90%); b. 05/2017 catheter directed tPA, mechanical thrombectomy, and stenting of the SMA.   Essential hypertension with goal blood pressure less than 140/90 01/19/2016   Gastroesophageal reflux disease 01/19/2016   GERD (gastroesophageal reflux disease)    H/O colectomy    History of 2019 novel  coronavirus disease (COVID-19) 11/14/2019   History of kidney stones    Hyperlipidemia    Hypertension    Morbid obesity with BMI of 40.0-44.9, adult    NSTEMI (non-ST elevated myocardial infarction) 06/27/2021   a.) high sensitivity troponins trended: 893 --> 1587 --> 1748 --> 868 --> 735 ng/L   Occlusive mesenteric ischemia 06/19/2017   Osteoarthritis    PAD (peripheral artery disease)    PAF (paroxysmal atrial fibrillation)    a.) CHA2DS2-VASc = 6 (sex, CHF, HTN, DVT x2, aortic plaque). b.) rate/rhythm maintained on oral metoprolol tartrate; chronically anticoagulated with standard dose apixaban.   Palpitations    Peripheral edema    Personal history of other malignant neoplasm of skin 06/01/2016   Pneumonia    Pre-diabetes    Primary osteoarthritis of both knees 06/13/2017   SBO (small bowel obstruction) 08/06/2017   Skin cancer of nose    Superior mesenteric artery thrombosis    Vitamin D deficiency     Past Surgical History:  Procedure Laterality Date   AMPUTATION Right 04/06/2022   Procedure: AMPUTATION ABOVE KNEE;  Surgeon: Annice Needy, MD;  Location: [facility];  Service: Vascular;  Laterality: Right;   AMPUTATION  12/07/2022   Procedure: AMPUTATION ABOVE KNEE REVISION WITH RESECTION OF FEMUR;  Surgeon: Annice Needy, MD;  Location: [facility];  Service: Vascular;;   AMPUTATION TOE Left 12/09/2019   Procedure: AMPUTATION TOE MPJ left;  Surgeon: Rosetta Posner, DPM;  Location: [facility];  Service: Podiatry;  Laterality: Left;   APPENDECTOMY     APPLICATION OF WOUND VAC  12/01/2021   Procedure: APPLICATION OF WOUND VAC;  Surgeon: Annice Needy, MD;  Location: [facility];  Service: Vascular;;   APPLICATION OF WOUND VAC Right 05/03/2022   Procedure: APPLICATION OF WOUND VAC;  Surgeon: Annice Needy, MD;  Location: [facility];  Service: Vascular;  Laterality: Right;   APPLICATION OF WOUND VAC Left 07/23/2022   Procedure: APPLICATION OF WOUND VAC;  Surgeon: Nada Libman, MD;  Location:  [facility];  Service: Vascular;  Laterality: Left;  Prevena   APPLICATION OF WOUND VAC Right 08/28/2022   Procedure: APPLICATION OF WOUND VAC;  Surgeon: Annice Needy, MD;  Location: [facility];  Service: Vascular;  Laterality: Right;   APPLICATION OF WOUND VAC  12/07/2022   Procedure: APPLICATION OF WOUND VAC;  Surgeon: Annice Needy, MD;  Location: [facility];  Service: Vascular;;   AXILLARY-FEMORAL BYPASS GRAFT Left 07/23/2022   Procedure: BYPASS GRAFT AXILLA-BIFEMORAL;  Surgeon: Nada Libman, MD;  Location: [facility];  Service: Vascular;  Laterality: Left;   CHOLECYSTECTOMY     COLON SURGERY     COLONOSCOPY WITH PROPOFOL N/A 03/23/2022   Procedure: COLONOSCOPY WITH PROPOFOL;  Surgeon: Wyline Mood, MD;  Location: [facility];  Service: Gastroenterology;  Laterality: N/A;  rectal bleed   EMBOLECTOMY  OR THROMBECTOMY, WITH OR WITHOUT CATHETER; FEMOROPOPLITEAL, AORTOILIAC ARTERY, BY LEG INCISION N/A 06/21/2020   Left - Decomp Fasciotomy Leg; Ant &/Or Lat Compart Only; Midline - Angiography, Visceral, Selective Or Supraselective (With Or Without Flush Aortogram); Left - Revascularize, Endovasc, Open/Percut, Iliac Artery, Unilat, Initial Vessel; W/Translum Stent, W/Angioplasty; Right - Revascularize, Endovasc, Open/Percut, Iliac Artery, Ea Add`L Ipsilateral; W/Translumin Stent, W/Angioplasty; Location [facility];   ENDARTERECTOMY FEMORAL Left 12/01/2021   Procedure: ENDARTERECTOMY FEMORAL;  Surgeon: Annice Needy, MD;  Location: [facility];  Service: Vascular;  Laterality: Left;   ENDOVASCULAR REPAIR/STENT GRAFT Left 12/01/2021   Procedure: ENDOVASCULAR REPAIR/STENT GRAFT;  Surgeon: Annice Needy, MD;  Location: [facility];  Service: Cardiovascular;  Laterality: Left;   INCISION AND DRAINAGE OF WOUND Right 05/03/2022   Procedure: IRRIGATION AND DEBRIDEMENT RIGHT STUMP;  Surgeon: Annice Needy, MD;  Location: [facility];  Service: Vascular;  Laterality: Right;   IRRIGATION AND DEBRIDEMENT ABSCESS Right 08/28/2022    Procedure: AKA IRRIGATION AND DEBRIDEMENT ABSCESS;  Surgeon: Annice Needy, MD;  Location: [facility];  Service: Vascular;  Laterality: Right;   LAPAROTOMY N/A 08/08/2017   Procedure: EXPLORATORY LAPAROTOMY POSSIBLE BOWEL RESECTION;  Surgeon: Leafy Ro, MD;  Location: [facility];  Service: General;  Laterality: N/A;   LEFT HEART CATH AND CORONARY ANGIOGRAPHY N/A 05/17/2021   Procedure: LEFT HEART CATH AND CORONARY ANGIOGRAPHY;  Surgeon: Alwyn Pea, MD;  Location: [facility];  Service: Cardiovascular;  Laterality: N/A;   LOWER EXTREMITY ANGIOGRAPHY Left 11/17/2019   Procedure: LOWER EXTREMITY ANGIOGRAPHY;  Surgeon: Annice Needy, MD;  Location: [facility];  Service: Cardiovascular;  Laterality: Left;   LOWER EXTREMITY ANGIOGRAPHY Right 12/13/2020   Procedure: LOWER EXTREMITY ANGIOGRAPHY;  Surgeon: Annice Needy, MD;  Location: [facility];  Service: Cardiovascular;  Laterality: Right;   LOWER EXTREMITY ANGIOGRAPHY Right 01/10/2021   Procedure: LOWER EXTREMITY ANGIOGRAPHY;  Surgeon: Annice Needy, MD;  Location: [facility];  Service: Cardiovascular;  Laterality: Right;   LOWER EXTREMITY ANGIOGRAPHY Right 01/24/2021   Procedure: LOWER EXTREMITY ANGIOGRAPHY;  Surgeon: Annice Needy, MD;  Location: [facility];  Service: Cardiovascular;  Laterality: Right;   LOWER EXTREMITY ANGIOGRAPHY Bilateral 06/28/2021   Procedure: Lower Extremity Angiography;  Surgeon: Annice Needy, MD;  Location: [facility];  Service: Cardiovascular;  Laterality: Bilateral;   LOWER EXTREMITY ANGIOGRAPHY Right 11/28/2021   Procedure: LOWER EXTREMITY ANGIOGRAPHY;  Surgeon: Annice Needy, MD;  Location: [facility];  Service: Cardiovascular;  Laterality: Right;   LOWER EXTREMITY ANGIOGRAPHY Left 11/30/2021   Procedure: Lower Extremity Angiography;  Surgeon: Annice Needy, MD;  Location: [facility];  Service: Cardiovascular;  Laterality: Left;   LOWER EXTREMITY  ANGIOGRAPHY Right 02/22/2022   Procedure: Lower Extremity Angiography;  Surgeon: Annice Needy, MD;  Location: [facility];  Service: Cardiovascular;  Laterality: Right;   LOWER EXTREMITY ANGIOGRAPHY Right 03/13/2022   Procedure: Lower Extremity Angiography;  Surgeon: Annice Needy, MD;  Location: [facility];  Service: Cardiovascular;  Laterality: Right;   LOWER EXTREMITY ANGIOGRAPHY Right 03/24/2022   Procedure: Lower Extremity Angiography;  Surgeon: Annice Needy, MD;  Location: [facility];  Service: Cardiovascular;  Laterality: Right;   LOWER EXTREMITY ANGIOGRAPHY Left 01/17/2023   Procedure: Lower Extremity Angiography;  Surgeon: Annice Needy, MD;  Location: [facility];  Service: Cardiovascular;  Laterality: Left;  LOWER EXTREMITY INTERVENTION N/A 11/19/2019   Procedure: LOWER EXTREMITY INTERVENTION;  Surgeon: Annice Needy, MD;  Location: [facility];  Service: Cardiovascular;  Laterality: N/A;   LOWER EXTREMITY INTERVENTION Bilateral 06/29/2021   Procedure: LOWER EXTREMITY INTERVENTION;  Surgeon: Annice Needy, MD;  Location: [facility];  Service: Cardiovascular;  Laterality: Bilateral;   LOWER EXTREMITY INTERVENTION Left 01/18/2023   Procedure: LOWER EXTREMITY INTERVENTION;  Surgeon: Annice Needy, MD;  Location: [facility];  Service: Cardiovascular;  Laterality: Left;   TEE WITHOUT CARDIOVERSION N/A 06/22/2017   Procedure: TRANSESOPHAGEAL ECHOCARDIOGRAM (TEE);  Surgeon: Iran Ouch, MD;  Location: [facility];  Service: Cardiovascular;  Laterality: N/A;   TOTAL KNEE ARTHROPLASTY Right 07/11/2018   Procedure: TOTAL KNEE ARTHROPLASTY;  Surgeon: Christena Flake, MD;  Location: [facility];  Service: Orthopedics;  Laterality: Right;   VAGINAL HYSTERECTOMY     VISCERAL ARTERY INTERVENTION N/A 06/20/2017   Procedure: Visceral Artery Intervention, possible aortic thrombectomy;  Surgeon: Annice Needy, MD;  Location: [facility];   Service: Cardiovascular;  Laterality: N/A;   VISCERAL ARTERY INTERVENTION N/A 01/28/2018   Procedure: VISCERAL ARTERY INTERVENTION;  Surgeon: Annice Needy, MD;  Location: [facility];  Service: Cardiovascular;  Laterality: N/A;     Home Medications:  Prior to Admission medications   Medication Sig Start Date End Date Taking? Authorizing Provider  alum & mag hydroxide-simeth (MAALOX/MYLANTA) 200-200-20 MG/5ML suspension Take 15-30 mLs by mouth every 2 (two) hours as needed for indigestion. 08/11/22   Arnetha Courser, MD  apixaban (ELIQUIS) 5 MG TABS tablet Take 1 tablet (5 mg total) by mouth 2 (two) times daily. 01/25/21   Stegmayer, Ranae Plumber, PA-C  aspirin EC 81 MG tablet Take 81 mg by mouth daily. Swallow whole.    [provider]  atorvastatin (LIPITOR) 80 MG tablet Take 80 mg by mouth at bedtime.    [provider]  bisacodyl (DULCOLAX) 5 MG EC tablet Take 1 tablet (5 mg total) by mouth daily as needed for moderate constipation. 09/08/22   Sunnie Nielsen, DO  citalopram (CELEXA) 40 MG tablet Take 1 tablet (40 mg total) by mouth daily. 02/04/22 09/07/22  Darlin Priestly, MD  COMBIVENT RESPIMAT 20-100 MCG/ACT AERS respimat Inhale 1 puff into the lungs every 6 (six) hours as needed. 03/27/22   [provider]  esomeprazole (NEXIUM) 20 MG capsule Take 20 mg by mouth daily. 07/06/22   [provider]  feeding supplement (ENSURE ENLIVE / ENSURE PLUS) LIQD Take 237 mLs by mouth 2 (two) times daily between meals. 05/06/22   [name], MD  ferrous sulfate 325 (65 FE) MG tablet Take 325 mg by mouth daily.    [provider]  fluconazole (DIFLUCAN) 150 MG tablet Take 150 mg by mouth every 3 (three) days. 01/08/23   [provider]  HYDROcodone-acetaminophen (NORCO) 10-325 MG tablet Take 1 tablet by mouth every 4 (four) hours as needed. 01/07/23   [provider]  isosorbide mononitrate (IMDUR) 60 MG 24 hr tablet Take 60 mg by mouth daily.  01/23/22   [provider]  lidocaine (LIDODERM) 5 % Place 1 patch onto the skin daily. Remove & Discard patch within 12 hours or as directed by MD 08/11/22   Arnetha Courser, MD  metoprolol tartrate (LOPRESSOR) 25 MG tablet Take 0.5 tablets (12.5 mg total) by mouth 2 (two) times daily. Hold if SBP is 100 or less or HR is less than  60. Notify Provider Patient taking differently: Take 50 mg by mouth 2 (two) times daily. Hold if SBP is 100 or less or HR is less than 60. Notify Provider 09/08/22   Sunnie Nielsen, DO  metroNIDAZOLE (FLAGYL) 500 MG tablet Take 1 tablet (500 mg total) by mouth 2 (two) times daily. 01/05/23   Lynn Ito, MD  Omadacycline Tosylate (NUZYRA) 150 MG TABS Take 2 tablets (300 mg total) by mouth daily. 01/24/23   Lynn Ito, MD  oxyCODONE (ROXICODONE) 5 MG immediate release tablet Take 1-2 tablets (5-10 mg total) by mouth every 4 (four) hours as needed for severe pain. 01/19/23   Pace, Peggye Ley, NP  oxyCODONE-acetaminophen (PERCOCET/ROXICET) 5-325 MG tablet Take 1-2 tablets by mouth every 4 (four) hours as needed for severe pain. 12/13/22   Marrion Coy, MD  polyethylene glycol (MIRALAX / GLYCOLAX) 17 g packet Take 17 g by mouth daily. 08/12/22   Arnetha Courser, MD  pregabalin (LYRICA) 200 MG capsule Take 1 capsule (200 mg total) by mouth in the morning, at noon, and at bedtime. 01/31/23 05/31/23  Georgiana Spinner, NP    Inpatient Medications: Scheduled Meds:  Continuous Infusions:  PRN Meds:   Allergies:    Allergies  Allergen Reactions   Gabapentin     Tremors    Bactrim [Sulfamethoxazole-Trimethoprim] Itching    Social History:   Social History   Socioeconomic History   Marital status: Single    Spouse name: Not on file   Number of children: Not on file   Years of education: Not on file   Highest education level: Not on file  Occupational History   Occupation: Unemployed  Tobacco Use   Smoking status: Former    Packs/day: 1     Types: Cigarettes    Quit date: 2023    Years since quitting: 1.3   Smokeless tobacco: Never  Vaping Use   Vaping Use: Never used  Substance and Sexual Activity   Alcohol use: No   Drug use: No   Sexual activity: Not on file  Other Topics Concern   Not on file  Social History Narrative   Lives in [place] with daughter   Social Determinants of Health   Financial Resource Strain: Not on file  Food Insecurity: No Food Insecurity (01/17/2023)   Hunger Vital Sign    Worried About Running Out of Food in the Last Year: Never true    Ran Out of Food in the Last Year: Never true  Transportation Needs: No Transportation Needs (01/17/2023)   PRAPARE - Administrator, Civil Service (Medical): No    Lack of Transportation (Non-Medical): No  Physical Activity: Not on file  Stress: Not on file  Social Connections: Not on file  Intimate Partner Violence: Not At Risk (01/17/2023)   Humiliation, Afraid, Rape, and Kick questionnaire    Fear of Current or Ex-Partner: No    Emotionally Abused: No    Physically Abused: No    Sexually Abused: No    Family History:    Family History  Problem Relation Age of Onset   Hypertension Mother    Heart disease Mother    Heart attack Mother    Breast cancer Mother    Heart disease Father    Hypertension Father    Breast cancer Paternal Aunt      ROS:  Please see the history of present illness.  Notes intermittent dyspnea, fatigue.  She has a wound VAC on the right leg  for poor healing stump. All other ROS reviewed and negative.     Physical Exam/Data:   Vitals:   02/15/23 1322 02/15/23 1327 02/15/23 1332 02/15/23 1337  BP: 108/69 108/61 120/75 117/72  Pulse: 68 65 67 65  Resp: 17 (!) 23 (!) 21 19  Temp:      TempSrc:      SpO2: 95% 95% 97% 97%  Weight:      Height:        Intake/Output Summary (Last 24 hours) at 02/15/2023 1423 Last data filed at 02/15/2023 1221 Gross per 24 hour  Intake 500 ml  Output --  Net 500  ml      02/15/2023   11:22 AM 01/17/2023   11:56 AM 12/10/2022    4:55 AM  Last 3 Weights  Weight (lbs) 214 lb 15.2 oz 215 lb 221 lb 5.5 oz  Weight (kg) 97.5 kg 97.523 kg 100.4 kg     Body mass index is 39.31 kg/m.  General: Obese, chronically ill-appearing woman who looks older than stated age in moderate distress although she seems somewhat sedated from fatigue and fentanyl HEENT: NCAT, EOMI. Neck: no JVD Vascular: No carotid bruits although there is a turbulent murmur heard in the left axilla.  Unable to palpate left pedal pulses in the right AKA stump has a wound VAC in place. Cardiac: Distant heart sounds, normal S1, S2; RRR; no murmur rub or Lungs:  clear to auscultation bilaterally, no wheezing, rhonchi or rales ; nonlabored, but seems to have some complaints of dyspnea Abd: soft, nontender, no hepatomegaly  Ext: Does not appear to have any left leg edema Musculoskeletal: Right AKA stump with wound VAC.  No femoral pulse.  Left leg has axillofemoral bypass with nondopplerable pulses in the popliteal and pedal arteries. Skin: warm and dry  Neuro:  CNs 2-12 intact, no focal abnormalities noted Psych: Seems to be very upset and distressed.  EKG:  The EKG was personally reviewed and demonstrates: Sinus rhythm-rate 60 bpm.  Subtle 0.5 to 1 mm ST elevations in II, 3 and aVF with J-point elevation in V4 through V 6.  No reciprocal changes.  Cannot exclude inferior injury pattern. Telemetry:  Telemetry was personally reviewed and demonstrates: Sinus rhythm  Relevant CV Studies: Extensive PVD procedures  TTE 06/15/2018: EF 60 to 65%.  No RWMA.  GR 1 DD. Echocardiogram 2D complete: (09/14/2021) INTERPRETATION NORMAL LEFT VENTRICULAR SYSTOLIC FUNCTION NORMAL RIGHT VENTRICULAR SYSTOLIC FUNCTION TRIVIAL REGURGITATION NOTED (See above) NO VALVULAR STENOSIS TRIVIAL MR, TR EF >55%  Cardiac cath 05/18/2019: Proximal RCA focal 60%.  D1 50%.  EF 55 to 65%.  Normal EDP.  RCA lesion equivocal.   Recommended medical management.  Laboratory Data:  High Sensitivity Troponin:   Recent Labs  Lab 02/15/23 1136  TROPONINIHS 4     Chemistry Recent Labs  Lab 02/15/23 1136  NA 137  K 4.5  CL 104  CO2 25  GLUCOSE 112*  BUN 14  CREATININE 0.57  CALCIUM 8.6*  GFRNONAA >60  ANIONGAP 8    No results for input(s): "PROT", "ALBUMIN", "AST", "ALT", "ALKPHOS", "BILITOT" in the last 168 hours. Lipids No results for input(s): "CHOL", "TRIG", "HDL", "LABVLDL", "LDLCALC", "CHOLHDL" in the last 168 hours.  Hematology Recent Labs  Lab 02/15/23 1136  WBC 5.9  RBC 3.71*  HGB 10.4*  HCT 34.2*  MCV 92.2  MCH 28.0  MCHC 30.4  RDW 17.0*  PLT 275   Thyroid No results for input(s): "TSH", "FREET4" in the  last 168 hours.  BNPNo results for input(s): "BNP", "PROBNP" in the last 168 hours.  DDimer No results for input(s): "DDIMER" in the last 168 hours.   Radiology/Studies:  No results found.   Assessment and Plan:   Principal Problem:   Acute lower limb ischemia Active Problems:   Coronary artery disease involving native coronary artery of native heart   ST elevation   Atherosclerosis of artery of extremity with rest pain  Patient presents with noncardiac issues albeit a cardiovascular issue having acute leg pain but now she says she is having 7/10 at chest pain with an EKG concerning for possible ST elevation MI.  I agree with Dr. Fanny Bien that this is probably not a STEMI however with her known anatomy and need for preop evaluation for possible treatment of left leg ischemic limb, I felt that it was prudent to urgently evaluate for possible STEMI.  She was taken to the cardiac catheterization lab where she was found to have significant proximal RCA spasm exacerbated by catheter placement.  I was able to exchanged for a guide catheter and gives IC NTG and the 90+ percent lesion resolved to about 30%.  This indicates that she very well has some coronary spasm, but does not require  PCI. => Recommendation would be to increase antispasm medications-I have increased Imdur to 120 mg.  Could also consider calcium channel blocker but will defer to her primary cardiologist.  She has an ongoing consult by vascular surgery and they are aware of the results. She will be admitted to the medicine service. She was on Eliquis preop, as I am not sure if she will need any procedures, I have not reordered Eliquis but it ordered for heparin and start 2 hours after TR band removal.  Patient's cardiologist is Dr. Juliann Pares from [facility], he is aware, but-would need for formal consult. I will continue her other home medication regimen.  She has lots of pain control medications but I will defer to the hospitalist service after admission. She is on 80 mg atorvastatin.  At home was on 60 mg of Imdur which I will increase to 120 mg.  Continue aspirin and low-dose Lopressor-limited by blood pressure.    Risk Assessment/Risk Scores:     TIMI Risk Score for ST  Elevation MI:   The patient's TIMI risk score is 6, which indicates a 16.1% risk of all cause mortality at 30 days.   New York Heart Association (NYHA) Functional Class NYHA Class II     For questions or updates, please contact [facility] Please consult [email or web address] for contact info under    Signed, Bryan Lemma, MD  02/15/2023 2:23 PM

## 2023-02-15 NOTE — Telephone Encounter (Signed)
Given her history, I suspect she has an occlusion of her graft.  She should go to the ED

## 2023-02-15 NOTE — Assessment & Plan Note (Addendum)
Patient presented with chest pain has been intermittent, in addition to ST elevation in the lateral leads.  Left heart cath demonstrated RCA spasm, resolved with nitroglycerin.  No stenting required. - Imdur increased to 120 mg daily - Continue Cardizem if blood pressure allows.

## 2023-02-15 NOTE — ED Triage Notes (Signed)
Pt presents to the ED with left leg pain. Pt has a hx of vascular disease and a DVT in the right leg. 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 Pt had this done in June. After complications from the amputation she had a wound vac placed a couple of months later. Wound vac still in place. Pt reports new onset of left leg pain about 3-4 days ago. States that this pain feels similar to when she had the DVT in her right leg. Denies further pain. Pt A&Ox4, from home and BIB [facility].

## 2023-02-15 NOTE — ED Notes (Signed)
EKG showing some ST elevation. MD is aware. NOT code STEMI at this time per MD. Troponin to be collected and plan to consult with cardiology.

## 2023-02-15 NOTE — Progress Notes (Signed)
ANTICOAGULATION CONSULT NOTE - Initial Consult  Pharmacy Consult for IV heparin Indication: STEMI  Allergies  Allergen Reactions   Gabapentin     Tremors    Bactrim [Sulfamethoxazole-Trimethoprim] Itching    Patient Measurements: Height:  (157.5 cm) Weight: 97.5 kg (214 lb 15.2 oz) IBW/kg (Calculated) : 50.1 Heparin Dosing Weight: 73.1 kg  Vital Signs: Temp: 97.6 F (36.4 C) (04/25 1500) Temp Source: Oral (04/25 1130) BP: 109/65 (04/25 1830) Pulse Rate: 62 (04/25 1830)  Labs: Recent Labs    02/15/23 1136 02/15/23 1427  HGB 10.4*  --   HCT 34.2*  --   PLT 275  --   LABPROT 14.6  --   INR 1.2  --   CREATININE 0.57  --   CKTOTAL 27*  --   TROPONINIHS 4 9     Estimated Creatinine Clearance: 78.5 mL/min (by C-G formula based on SCr of 0.57 mg/dL).   Medical History: Past Medical History:  Diagnosis Date   Abdominal aortic atherosclerosis (HCC)    a. 05/2017 CTA abd/pelvis: significant atherosclerotic dzs of the infrarenal abd Ao w/ some mural thrombus. No aneurysm or dissection.   Acute focal ischemia of small intestine    Acute right-sided low back pain with right-sided sciatica 06/13/2017   AKI (acute kidney injury) (HCC) 07/29/2017   Anemia    Anginal pain (HCC)    a. 08/2012 Lexiscan MV: EF 54%, non ischemia/infarct.   Anxiety and depression    Baker's cyst of knee, right    a. 07/2016 U/S: 4.1 x 1.4 x 2.9 cystic structure in R poplitetal fossa.   Bell's palsy    CAD (coronary artery disease)    CHF (congestive heart failure) (HCC)    a.) TTE 06/20/2017: EF 60-65%, no rwma, G1DD, no source of cardiac emboli.   Chronic anticoagulation    a.) apixaban   Chronic idiopathic constipation    Chronic mesenteric ischemia (HCC)    Chronic pain    COPD (chronic obstructive pulmonary disease) (HCC)    Dyspnea    Embolus of superior mesenteric artery (HCC)    a. 05/2017 CTA Abd/pelvis: apparent thrombus or embolus in prox SMA (70-90%); b. 05/2017 catheter  directed tPA, mechanical thrombectomy, and stenting of the SMA.   Essential hypertension with goal blood pressure less than 140/90 01/19/2016   Gastroesophageal reflux disease 01/19/2016   GERD (gastroesophageal reflux disease)    H/O colectomy    History of 2019 novel coronavirus disease (COVID-19) 11/14/2019   History of kidney stones    Hyperlipidemia    Hypertension    Morbid obesity with BMI of 40.0-44.9, adult (HCC)    NSTEMI (non-ST elevated myocardial infarction) (HCC) 06/27/2021   a.) high sensitivity troponins trended: 893 --> 1587 --> 1748 --> 868 --> 735 ng/L   Occlusive mesenteric ischemia (HCC) 06/19/2017   Osteoarthritis    PAD (peripheral artery disease) (HCC)    PAF (paroxysmal atrial fibrillation) (HCC)    a.) CHA2DS2-VASc = 6 (sex, CHF, HTN, DVT x2, aortic plaque). b.) rate/rhythm maintained on oral metoprolol tartrate; chronically anticoagulated with standard dose apixaban.   Palpitations    Peripheral edema    Personal history of other malignant neoplasm of skin 06/01/2016   Pneumonia    Pre-diabetes    Primary osteoarthritis of both knees 06/13/2017   SBO (small bowel obstruction) (HCC) 08/06/2017   Skin cancer of nose    Superior mesenteric artery thrombosis (HCC)    Vitamin D deficiency     Medications:  Eliquis 5 mg BID  Assessment: 62 year old female admitted with STEMI now s/p cath. PMH includes PAD s/p right AKA, CAD, HFpEF, HTN, HLD, atrial fibrillation on Eliquis prior to admission. Pharmacy has been consulted to start heparin 2 hours after radial band removal.  Goal of Therapy:  aPTT 66-85 seconds  Monitor platelets by anticoagulation protocol: Yes   Plan:  Start heparin infusion 800 units/hr Check aPTT 6 hours from initiation of heparin CBC daily  Sharen Hones, PharmD, BCPS Clinical Pharmacist   02/15/2023 6:40 PM

## 2023-02-15 NOTE — ED Notes (Addendum)
MD at bedside and unable to doppler a pulse.

## 2023-02-15 NOTE — H&P (Addendum)
History and Physical    Patient: Carla [name] ZOX:[id number] DOB: 1960-11-20 DOA: 02/15/2023 DOS: the patient was seen and examined on 02/15/2023 PCP: Terri Piedra, PA-C  Patient coming from: Home  Chief Complaint:  Chief Complaint  Patient presents with   Leg Pain   HPI: Carla [name] is a 62 y.o. female with medical history significant of severe PAD s/p right AKA, CAD, HFpEF, hypertension, hyperlipidemia, paroxysmal atrial fibrillation on Eliquis, SMA thrombosis, who presents to the ED due to leg pain.  [name] states she has been experiencing left lower extremity pain for the last 1 week, however it suddenly worsened this morning and that is why she called EMS.  She endorses chronic numbness and tingling that is unchanged.  In addition, she has been experiencing intermittent chest pain that occurs at rest and self resolves.  She denies any shortness of breath or palpitations.  She denies any nausea, vomiting, diarrhea.  She endorses some pain overlying her umbilical hernia but no other abdominal pain.  ED course: On arrival to the ED, patient was normotensive at 97/84 with heart rate of 66.  She was saturating at 92% on room air.  She was afebrile at 98.  EKG obtained on arrival demonstrated ST elevations in the inferior leads.  Patient noted chest pain and she was emergently taken to the Cath Lab.  Initial workup demonstrated hemoglobin of 10.4, potassium 4.5, glucose 112, BUN 14, creatinine 0.57 and GFR above 60.  Initial troponin negative at 4.  LHC demonstrated RCA spasm with improvement after nitroglycerin.  TRH contacted for admission.  Review of Systems: As mentioned in the history of present illness. All other systems reviewed and are negative.  Past Medical History:  Diagnosis Date   Abdominal aortic atherosclerosis    a. 05/2017 CTA abd/pelvis: significant atherosclerotic dzs of the infrarenal abd Ao w/ some mural thrombus. No aneurysm or dissection.   Acute focal  ischemia of small intestine    Acute right-sided low back pain with right-sided sciatica 06/13/2017   AKI (acute kidney injury) 07/29/2017   Anemia    Anginal pain    a. 08/2012 Lexiscan MV: EF 54%, non ischemia/infarct.   Anxiety and depression    Baker's cyst of knee, right    a. 07/2016 U/S: 4.1 x 1.4 x 2.9 cystic structure in R poplitetal fossa.   Bell's palsy    CAD (coronary artery disease)    CHF (congestive heart failure)    a.) TTE 06/20/2017: EF 60-65%, no rwma, G1DD, no source of cardiac emboli.   Chronic anticoagulation    a.) apixaban   Chronic idiopathic constipation    Chronic mesenteric ischemia    Chronic pain    COPD (chronic obstructive pulmonary disease)    Dyspnea    Embolus of superior mesenteric artery (HCC)    a. 05/2017 CTA Abd/pelvis: apparent thrombus or embolus in prox SMA (70-90%); b. 05/2017 catheter directed tPA, mechanical thrombectomy, and stenting of the SMA.   Essential hypertension with goal blood pressure less than 140/90 01/19/2016   Gastroesophageal reflux disease 01/19/2016   GERD (gastroesophageal reflux disease)    H/O colectomy    History of 2019 novel coronavirus disease (COVID-19) 11/14/2019   History of kidney stones    Hyperlipidemia    Hypertension    Morbid obesity with BMI of 40.0-44.9, adult    NSTEMI (non-ST elevated myocardial infarction) 06/27/2021   a.) high sensitivity troponins trended: 893 --> 1587 --> 1748 --> 868 -->  735 ng/L   Occlusive mesenteric ischemia 06/19/2017   Osteoarthritis    PAD (peripheral artery disease)    PAF (paroxysmal atrial fibrillation)    a.) CHA2DS2-VASc = 6 (sex, CHF, HTN, DVT x2, aortic plaque). b.) rate/rhythm maintained on oral metoprolol tartrate; chronically anticoagulated with standard dose apixaban.   Palpitations    Peripheral edema    Personal history of other malignant neoplasm of skin 06/01/2016   Pneumonia    Pre-diabetes    Primary osteoarthritis of both knees 06/13/2017   SBO  (small bowel obstruction) 08/06/2017   Skin cancer of nose    Superior mesenteric artery thrombosis    Vitamin D deficiency    Past Surgical History:  Procedure Laterality Date   AMPUTATION Right 04/06/2022   Procedure: AMPUTATION ABOVE KNEE;  Surgeon: Annice Needy, MD;  Location: [facility];  Service: Vascular;  Laterality: Right;   AMPUTATION  12/07/2022   Procedure: AMPUTATION ABOVE KNEE REVISION WITH RESECTION OF FEMUR;  Surgeon: Annice Needy, MD;  Location: [facility];  Service: Vascular;;   AMPUTATION TOE Left 12/09/2019   Procedure: AMPUTATION TOE MPJ left;  Surgeon: Rosetta Posner, DPM;  Location: [facility];  Service: Podiatry;  Laterality: Left;   APPENDECTOMY     APPLICATION OF WOUND VAC  12/01/2021   Procedure: APPLICATION OF WOUND VAC;  Surgeon: Annice Needy, MD;  Location: [facility];  Service: Vascular;;   APPLICATION OF WOUND VAC Right 05/03/2022   Procedure: APPLICATION OF WOUND VAC;  Surgeon: Annice Needy, MD;  Location: [facility];  Service: Vascular;  Laterality: Right;   APPLICATION OF WOUND VAC Left 07/23/2022   Procedure: APPLICATION OF WOUND VAC;  Surgeon: Nada Libman, MD;  Location: [facility];  Service: Vascular;  Laterality: Left;  Prevena   APPLICATION OF WOUND VAC Right 08/28/2022   Procedure: APPLICATION OF WOUND VAC;  Surgeon: Annice Needy, MD;  Location: [facility];  Service: Vascular;  Laterality: Right;   APPLICATION OF WOUND VAC  12/07/2022   Procedure: APPLICATION OF WOUND VAC;  Surgeon: Annice Needy, MD;  Location: [facility];  Service: Vascular;;   AXILLARY-FEMORAL BYPASS GRAFT Left 07/23/2022   Procedure: BYPASS GRAFT AXILLA-BIFEMORAL;  Surgeon: Nada Libman, MD;  Location: [facility];  Service: Vascular;  Laterality: Left;   CHOLECYSTECTOMY     COLON SURGERY     COLONOSCOPY WITH PROPOFOL N/A 03/23/2022   Procedure: COLONOSCOPY WITH PROPOFOL;  Surgeon: Wyline Mood, MD;  [facility];  Service: Gastroenterology;  Laterality: N/A;  rectal bleed   EMBOLECTOMY  OR THROMBECTOMY, WITH OR WITHOUT CATHETER; FEMOROPOPLITEAL, AORTOILIAC ARTERY, BY LEG INCISION N/A 06/21/2020   Left - Decomp Fasciotomy Leg; Ant &/Or Lat Compart Only; Midline - Angiography, Visceral, Selective Or Supraselective (With Or Without Flush Aortogram); Left - Revascularize, Endovasc, Open/Percut, Iliac Artery, Unilat, Initial Vessel; W/Translum Stent, W/Angioplasty; Right - Revascularize, Endovasc, Open/Percut, Iliac Artery, Ea Add`L Ipsilateral; W/Translumin Stent, W/Angioplasty; Location [facility];   ENDARTERECTOMY FEMORAL Left 12/01/2021   Procedure: ENDARTERECTOMY FEMORAL;  Surgeon: Annice Needy, MD;  Location: [facility];  Service: Vascular;  Laterality: Left;   ENDOVASCULAR REPAIR/STENT GRAFT Left 12/01/2021   Procedure: ENDOVASCULAR REPAIR/STENT GRAFT;  Surgeon: Annice Needy, MD;  Location: [facility];  Service: Cardiovascular;  Laterality: Left;   INCISION AND DRAINAGE OF WOUND Right 05/03/2022   Procedure: IRRIGATION AND DEBRIDEMENT RIGHT STUMP;  Surgeon: Annice Needy, MD;  Location: [facility];  Service: Vascular;  Laterality: Right;   IRRIGATION  AND DEBRIDEMENT ABSCESS Right 08/28/2022   Procedure: AKA IRRIGATION AND DEBRIDEMENT ABSCESS;  Surgeon: Annice Needy, MD;  Location: [facility];  Service: Vascular;  Laterality: Right;   LAPAROTOMY N/A 08/08/2017   Procedure: EXPLORATORY LAPAROTOMY POSSIBLE BOWEL RESECTION;  Surgeon: Leafy Ro, MD;  Location: [facility];  Service: General;  Laterality: N/A;   LEFT HEART CATH AND CORONARY ANGIOGRAPHY N/A 05/17/2021   Procedure: LEFT HEART CATH AND CORONARY ANGIOGRAPHY;  Surgeon: Alwyn Pea, MD;  Location: [facility];  Service: Cardiovascular;  Laterality: N/A;   LOWER EXTREMITY ANGIOGRAPHY Left 11/17/2019   Procedure: LOWER EXTREMITY ANGIOGRAPHY;  Surgeon: Annice Needy, MD;  Location: [facility];  Service: Cardiovascular;  Laterality: Left;   LOWER EXTREMITY ANGIOGRAPHY Right 12/13/2020   Procedure: LOWER  EXTREMITY ANGIOGRAPHY;  Surgeon: Annice Needy, MD;  Location: [facility];  Service: Cardiovascular;  Laterality: Right;   LOWER EXTREMITY ANGIOGRAPHY Right 01/10/2021   Procedure: LOWER EXTREMITY ANGIOGRAPHY;  Surgeon: Annice Needy, MD;  Location: [facility];  Service: Cardiovascular;  Laterality: Right;   LOWER EXTREMITY ANGIOGRAPHY Right 01/24/2021   Procedure: LOWER EXTREMITY ANGIOGRAPHY;  Surgeon: Annice Needy, MD;  Location: [facility];  Service: Cardiovascular;  Laterality: Right;   LOWER EXTREMITY ANGIOGRAPHY Bilateral 06/28/2021   Procedure: Lower Extremity Angiography;  Surgeon: Annice Needy, MD;  Location: [facility];  Service: Cardiovascular;  Laterality: Bilateral;   LOWER EXTREMITY ANGIOGRAPHY Right 11/28/2021   Procedure: LOWER EXTREMITY ANGIOGRAPHY;  Surgeon: Annice Needy, MD;  Location: [facility];  Service: Cardiovascular;  Laterality: Right;   LOWER EXTREMITY ANGIOGRAPHY Left 11/30/2021   Procedure: Lower Extremity Angiography;  Surgeon: Annice Needy, MD;  Location: [facility];  Service: Cardiovascular;  Laterality: Left;   LOWER EXTREMITY ANGIOGRAPHY Right 02/22/2022   Procedure: Lower Extremity Angiography;  Surgeon: Annice Needy, MD;  Location: [facility];  Service: Cardiovascular;  Laterality: Right;   LOWER EXTREMITY ANGIOGRAPHY Right 03/13/2022   Procedure: Lower Extremity Angiography;  Surgeon: Annice Needy, MD;  Location: [facility];  Service: Cardiovascular;  Laterality: Right;   LOWER EXTREMITY ANGIOGRAPHY Right 03/24/2022   Procedure: Lower Extremity Angiography;  Surgeon: Annice Needy, MD;  Location: [facility];  Service: Cardiovascular;  Laterality: Right;   LOWER EXTREMITY ANGIOGRAPHY Left 01/17/2023   Procedure: Lower Extremity Angiography;  Surgeon: Annice Needy, MD;  Location: [facility];  Service: Cardiovascular;  Laterality: Left;   LOWER EXTREMITY INTERVENTION N/A  11/19/2019   Procedure: LOWER EXTREMITY INTERVENTION;  Surgeon: Annice Needy, MD;  Location: [facility];  Service: Cardiovascular;  Laterality: N/A;   LOWER EXTREMITY INTERVENTION Bilateral 06/29/2021   Procedure: LOWER EXTREMITY INTERVENTION;  Surgeon: Annice Needy, MD;  Location: [facility];  Service: Cardiovascular;  Laterality: Bilateral;   LOWER EXTREMITY INTERVENTION Left 01/18/2023   Procedure: LOWER EXTREMITY INTERVENTION;  Surgeon: Annice Needy, MD;  Location: [facility];  Service: Cardiovascular;  Laterality: Left;   TEE WITHOUT CARDIOVERSION N/A 06/22/2017   Procedure: TRANSESOPHAGEAL ECHOCARDIOGRAM (TEE);  Surgeon: Iran Ouch, MD;  Location: [facility];  Service: Cardiovascular;  Laterality: N/A;   TOTAL KNEE ARTHROPLASTY Right 07/11/2018   Procedure: TOTAL KNEE ARTHROPLASTY;  Surgeon: Christena Flake, MD;  Location: [facility];  Service: Orthopedics;  Laterality: Right;   VAGINAL HYSTERECTOMY     VISCERAL ARTERY INTERVENTION  N/A 06/20/2017   Procedure: Visceral Artery Intervention, possible aortic thrombectomy;  Surgeon: Annice Needy, MD;  Location: [facility];  Service: Cardiovascular;  Laterality: N/A;   VISCERAL ARTERY INTERVENTION N/A 01/28/2018   Procedure: VISCERAL ARTERY INTERVENTION;  Surgeon: Annice Needy, MD;  Location: [facility];  Service: Cardiovascular;  Laterality: N/A;   Social History:  reports that she quit smoking about 15 months ago. Her smoking use included cigarettes. She smoked an average of 1 pack per day. She has never used smokeless tobacco. She reports that she does not drink alcohol and does not use drugs.  Allergies  Allergen Reactions   Gabapentin     Tremors    Bactrim [Sulfamethoxazole-Trimethoprim] Itching    Family History  Problem Relation Age of Onset   Hypertension Mother    Heart disease Mother    Heart attack Mother    Breast cancer Mother    Heart disease Father    Hypertension Father     Breast cancer Paternal Aunt     Prior to Admission medications   Medication Sig Start Date End Date Taking? Authorizing Provider  alum & mag hydroxide-simeth (MAALOX/MYLANTA) 200-200-20 MG/5ML suspension Take 15-30 mLs by mouth every 2 (two) hours as needed for indigestion. 08/11/22   Arnetha Courser, MD  apixaban (ELIQUIS) 5 MG TABS tablet Take 1 tablet (5 mg total) by mouth 2 (two) times daily. 01/25/21   Stegmayer, Ranae Plumber, PA-C  aspirin EC 81 MG tablet Take 81 mg by mouth daily. Swallow whole.    [provider]  atorvastatin (LIPITOR) 80 MG tablet Take 80 mg by mouth at bedtime.    [provider]  bisacodyl (DULCOLAX) 5 MG EC tablet Take 1 tablet (5 mg total) by mouth daily as needed for moderate constipation. 09/08/22   Sunnie Nielsen, DO  citalopram (CELEXA) 40 MG tablet Take 1 tablet (40 mg total) by mouth daily. 02/04/22 09/07/22  Darlin Priestly, MD  COMBIVENT RESPIMAT 20-100 MCG/ACT AERS respimat Inhale 1 puff into the lungs every 6 (six) hours as needed. 03/27/22   [provider]  esomeprazole (NEXIUM) 20 MG capsule Take 20 mg by mouth daily. 07/06/22   [provider]  feeding supplement (ENSURE ENLIVE / ENSURE PLUS) LIQD Take 237 mLs by mouth 2 (two) times daily between meals. 05/06/22   [name], MD  ferrous sulfate 325 (65 FE) MG tablet Take 325 mg by mouth daily.    [provider]  fluconazole (DIFLUCAN) 150 MG tablet Take 150 mg by mouth every 3 (three) days. 01/08/23   [provider]  HYDROcodone-acetaminophen (NORCO) 10-325 MG tablet Take 1 tablet by mouth every 4 (four) hours as needed. 01/07/23   [provider]  isosorbide mononitrate (IMDUR) 60 MG 24 hr tablet Take 60 mg by mouth daily. 01/23/22   [provider]  lidocaine (LIDODERM) 5 % Place 1 patch onto the skin daily. Remove & Discard patch within 12 hours or as directed by MD 08/11/22   Arnetha Courser, MD  metoprolol tartrate (LOPRESSOR) 25 MG tablet  Take 0.5 tablets (12.5 mg total) by mouth 2 (two) times daily. Hold if SBP is 100 or less or HR is less than 60. Notify Provider Patient taking differently: Take 50 mg by mouth 2 (two) times daily. Hold if SBP is 100 or less or HR is less than 60. Notify Provider 09/08/22   Sunnie Nielsen, DO  metroNIDAZOLE (FLAGYL) 500 MG tablet Take 1 tablet (500  mg total) by mouth 2 (two) times daily. 01/05/23   Lynn Ito, MD  Omadacycline Tosylate (NUZYRA) 150 MG TABS Take 2 tablets (300 mg total) by mouth daily. 01/24/23   Lynn Ito, MD  oxyCODONE (ROXICODONE) 5 MG immediate release tablet Take 1-2 tablets (5-10 mg total) by mouth every 4 (four) hours as needed for severe pain. 01/19/23   Pace, Peggye Ley, NP  oxyCODONE-acetaminophen (PERCOCET/ROXICET) 5-325 MG tablet Take 1-2 tablets by mouth every 4 (four) hours as needed for severe pain. 12/13/22   Marrion Coy, MD  polyethylene glycol (MIRALAX / GLYCOLAX) 17 g packet Take 17 g by mouth daily. 08/12/22   Arnetha Courser, MD  pregabalin (LYRICA) 200 MG capsule Take 1 capsule (200 mg total) by mouth in the morning, at noon, and at bedtime. 01/31/23 05/31/23  Georgiana Spinner, NP    Physical Exam: Vitals:   02/15/23 1332 02/15/23 1337 02/15/23 1500 02/15/23 1530  BP: 120/75 117/72 107/61 (!) 105/48  Pulse: 67 65 (!) 56 62  Resp: (!) Temp:   97.6 F (36.4 C)   TempSrc:      SpO2: 97% 97% 95% 96%  Weight:      Height:       Physical Exam Vitals and nursing note reviewed.  Constitutional:      General: She is not in acute distress.    Appearance: She is obese.  HENT:     Head: Normocephalic and atraumatic.     Mouth/Throat:     Mouth: Mucous membranes are moist.     Pharynx: Oropharynx is clear.  Eyes:     Conjunctiva/sclera: Conjunctivae normal.     Pupils: Pupils are equal, round, and reactive to light.  Cardiovascular:     Rate and Rhythm: Regular rhythm. Bradycardia present.     Heart sounds: No murmur heard.     No gallop.     Comments: Unable to doppler any left lower extremity pulses, including femoral.  Pulmonary:     Effort: Pulmonary effort is normal. No respiratory distress.     Breath sounds: Normal breath sounds. No wheezing.  Abdominal:     General: There is no distension.     Palpations: Abdomen is soft.     Tenderness: There is no abdominal tenderness.     Hernia: A hernia (large umbilical hernia with two small ulcerations that appear non-infected) is present.  Musculoskeletal:     Left lower leg: No edema.  Skin:    General: Skin is dry.     Comments:  Wound vac in place on right AKA stump. No significant erythema.  Left lower extremity cool to the tocuh  Neurological:     General: No focal deficit present.     Mental Status: She is alert and oriented to person, place, and time.  Psychiatric:        Mood and Affect: Mood normal. Affect is tearful.        Behavior: Behavior normal.    Data Reviewed: CBC with WBC of 5.9, hemoglobin 10.4, MCV 92, platelets of 275 BMP with sodium of 137, potassium 4.5, bicarb 25, glucose 112, BUN 14, creatinine 0.57, and GFR above 60 Troponin negative at 4 Lactic acid negative at 1.6 CK within normal limits at 27 INR within normal limits at 1.2  Initial EKG personally reviewed.  Sinus rhythm with rate of 63.  ST elevation noted in leads II, III and aVF.  Some elevation in the lateral leads, but  this appears more consistent with J-point elevation.  Repeat EKG with no significant changes.  There are no new results to review at this time.  Assessment and Plan:  * Acute lower limb ischemia Extensive history of PAD, with prior right AKA (June 2023) with recent stump revision (February 2024), and left femoral bypass mechanical thrombectomy (March 2024). Patient is presenting with 1 week of left lower extremity pain with sudden worsening today.  Left leg pulses are unable to be dopplered throughout, including femoral.   - Vascular surgery  consulted; appreciate their recommendations - Heparin infusion per pharmacy dosing 2 hours after cath being removed - Continue aspirin and high intensity statin - Lipid panel and lipoprotein a in the a.m. - Dilaudid for breakthrough pain  ST elevation Patient presented with chest pain has been intermittent, in addition to ST elevation in the lateral leads.  Left heart cath demonstrated RCA spasm, resolved with nitroglycerin.  No stenting required.  - Cardiology consulted; appreciate their recommendations - Echocardiogram ordered - Imdur increased to 120 mg daily - Continue home metoprolol  Coronary artery disease involving native coronary artery of native heart LHC today demonstrated nonobstructive CAD.  - Continue aspirin, metoprolol, Imdur  Amputation stump infection Stump wound covered by wound VAC, but no notable erythema.  - Continue home Omadacyclin - Wound VAC per vascular surgery - Dietitian consulted for wound healing  Paroxysmal atrial fibrillation Heart rate well-controlled at this time.  - Continue home metoprolol - Anticoagulation with heparin  Chronic diastolic CHF (congestive heart failure) Patient appears euvolemic at this time.  She denies any shortness of breath.  COPD (chronic obstructive pulmonary disease) No wheezing on examination at this time.  - Continue home bronchodilators with DuoNebs as needed.  Chronic pain - Continue home hydrocodone-acetaminophen with Dilaudid for breakthrough pain given acute ischemia  Advance Care Planning:   Code Status: Full Code verified by patient  Consults: Cardiology, vascular surgery  Family Communication: No family at bedside  Severity of Illness: The appropriate patient status for this patient is INPATIENT. Inpatient status is judged to be reasonable and necessary in order to provide the required intensity of service to ensure the patient's safety. The patient's presenting symptoms, physical exam findings,  and initial radiographic and laboratory data in the context of their chronic comorbidities is felt to place them at high risk for further clinical deterioration. Furthermore, it is not anticipated that the patient will be medically stable for discharge from the hospital within 2 midnights of admission.   * I certify that at the point of admission it is my clinical judgment that the patient will require inpatient hospital care spanning beyond 2 midnights from the point of admission due to high intensity of service, high risk for further deterioration and high frequency of surveillance required.*   Author: Verdene Lennert, MD 02/15/2023 4:02 PM  For on call review [email or web address].

## 2023-02-15 NOTE — Assessment & Plan Note (Addendum)
Increased oxycodone on 4/28 and continue with Dilaudid for breakthrough pain given acute ischemia

## 2023-02-15 NOTE — Telephone Encounter (Signed)
Patient called in stating she thinks she may have another blood clot her foot has been hurting really bad and she in unbearable pain.    Please call and advise

## 2023-02-15 NOTE — ED Provider Notes (Signed)
[facility] Provider Note    None    (approximate)   History   Leg Pain   HPI  Carla [name] is a 62 y.o. female patient reports she woke up with severe left leg pain.  She reports it feels similar to when she had problems with her right leg.  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  This morning when she got up she felt like her left leg felt a little cold in her feet.  She reports that is actually improved now.  She reports a severe pain in the whole left lower leg.  She called the vascular surgery clinic who recommended she come to the ER for evaluation.  Additionally EMS obtained EKG that demonstrates mild ST segment elevation in 2 3 and aVF only.  I discussed with the patient and she does relate that she has been having a very slight chest pain for about 3 days now.  She reports is very mild minor, and primarily her concern she reports severity of pain in the left leg.  No ripping tearing or moving pain  She feels like she has poor blood flow in the left foot.  He does take Eliquis, she took a dose this morning     Physical Exam   Triage Vital Signs: ED Triage Vitals  Enc Vitals Group     BP 02/15/23 1130 100/64     Pulse Rate 02/15/23 1130 65     Resp 02/15/23 1130 18     Temp 02/15/23 1130 98 F (36.7 C)     Temp Source 02/15/23 1130 Oral     SpO2 02/15/23 1118 96 %     Weight 02/15/23 1122 214 lb 15.2 oz (97.5 kg)     Height 02/15/23 1122  (1.575 m)     Head Circumference --      Peak Flow --      Pain Score 02/15/23 1122 10     Pain Loc --      Pain Edu? --      Excl. in GC? --     Most recent vital signs: Vitals:   02/15/23 1200 02/15/23 1230  BP: 97/84 106/62  Pulse: 63 72  Resp: 11 (!) 22  Temp:    SpO2: 92% 97%     General: Awake, no distress, except appears in mild to moderate pain reporting pain from about the left thigh down to the left foot. CV:  Good peripheral perfusion including in the right lower limb stump.  The left  lower extremity demonstrates no dopplerable dorsalis pedis, and a thready flow that is nonpulsatile through the left posterior tibial.  The toes are slightly cool to touch but there is no frank purpuric color, cyanosis or severe ischemia.  The left foot is slightly cool to touch Resp:  Normal effort.  Clear bilateral Abd:  No distention.  Soft nontender nondistended Other:     ED Results / Procedures / Treatments   Labs (all labs ordered are listed, but only abnormal results are displayed) Labs Reviewed  CBC - Abnormal; Notable for the following components:      Result Value   RBC 3.71 (*)    Hemoglobin 10.4 (*)    HCT 34.2 (*)    RDW 17.0 (*)    All other components within normal limits  BASIC METABOLIC PANEL - Abnormal; Notable for the following components:   Glucose, Bld 112 (*)    Calcium 8.6 (*)  All other components within normal limits  CK - Abnormal; Notable for the following components:   Total CK 27 (*)    All other components within normal limits  PROTIME-INR  LACTIC ACID, PLASMA  TROPONIN I (HIGH SENSITIVITY)     EKG  And interpreted by me at 1132 heart rate 65 QRS 90 QTc 420 Sinus rhythm, ST segment elevation is present in 2 3 and aVF.  Notably, the patient chief complaint is left leg pain and she has a clinical history and exam concerning for left limb ischemia.  I have consulted with vascular surgery, and the vascular surgery team is coming to see her for a stat consult.  She is already anticoagulated.  Additionally her EKG shows concerning findings for possible ischemia, but she reports she is only had very mild Beg minimal chest pain for 3 days and the reason for her presentation today involves significant concerning pain in her left lower leg.  At this juncture, I do not think STEMI activation is necessitated, but I have sent message to Dr. Herbie Baltimore  Dr. Herbie Baltimore came to the ER and evaluated the patient, he advised plans to take the patient for cardiac  catheterization after discussing with her.  Also vascular surgery team aware and has already seen and evaluated the patient.  Given the patient has been having chest pain for 3 days, she is well greater than 24 hours of symptoms, I did not activate as a code STEMI in addition clinical history is she seems to be most suggestive of left lower limb ischemia as primary concern for evaluation today.   RADIOLOGY     PROCEDURES:  Critical Care performed: Yes, see critical care procedure note(s)  Procedures  CRITICAL CARE Performed by: Sharyn Creamer   Total critical care time: 30 minutes  Critical care time was exclusive of separately billable procedures and treating other patients.  Critical care was necessary to treat or prevent imminent or life-threatening deterioration.  Critical care was time spent personally by me on the following activities: development of treatment plan with patient and/or surrogate as well as nursing, discussions with consultants, evaluation of patient's response to treatment, examination of patient, obtaining history from patient or surrogate, ordering and performing treatments and interventions, ordering and review of laboratory studies, ordering and review of radiographic studies, pulse oximetry and re-evaluation of patient's condition.   MEDICATIONS ORDERED IN ED: Medications  fentaNYL (SUBLIMAZE) injection 25 mcg (25 mcg Intravenous Given 02/15/23 1154)  sodium chloride 0.9 % bolus 500 mL (0 mLs Intravenous Stopped 02/15/23 1221)  aspirin chewable tablet 324 mg (324 mg Oral Given 02/15/23 1222)     IMPRESSION / MDM / ASSESSMENT AND PLAN / ED COURSE  I reviewed the triage vital signs and the nursing notes.                              Differential diagnosis includes, but is not limited to, critical limb ischemia, demand ischemia, ACS, rule out STEMI, etc.  Multiple facets and factors to consider here but of note she does have an exam concerning for left leg  ischemia with a chief complaint of severe left leg pain.  She does have ST elevation in inferior leads on EKG but again reports very mild chest discomfort for 3 days.  I have provided and obtain consult from vascular surgery as well as cardiology.  Patient's presentation is most consistent with acute complicated illness / injury requiring  diagnostic workup.   Labs notable for normal initial troponin.  Normal basic metabolic panel, mild anemia.  INR slightly elevated at 1.2, this would be in keeping with the patient's use of anticoagulant  The patient is on the cardiac monitor to evaluate for evidence of arrhythmia and/or significant heart rate changes.   Clinical Course as of 02/15/23 1241  Thu Feb 15, 2023  1209 Dr. Herbie Baltimore at bedside [MQ]    Clinical Course User Index [MQ] Sharyn Creamer, MD   ----------------------------------------- 12:40 PM on 02/15/2023 ----------------------------------------- Cardiology taking patient to cardiac catheterization lab to evaluate for possible acute ACS.  Vascular surgery team aware and following.  FINAL CLINICAL IMPRESSION(S) / ED DIAGNOSES   Final diagnoses:  Acute lower limb ischemia  ST elevation     Rx / DC Orders   ED Discharge Orders     None        Note:  This document was prepared using Dragon voice recognition software and may include unintentional dictation errors.   Sharyn Creamer, MD 02/15/23 [phone]

## 2023-02-15 NOTE — Telephone Encounter (Signed)
Patient also states that her leg is cold and woke her up out of her sleep

## 2023-02-15 NOTE — Assessment & Plan Note (Addendum)
-   Nonobstructive CAD, continue aspirin, metoprolol, Imdur

## 2023-02-15 NOTE — Assessment & Plan Note (Addendum)
Stump wound covered by wound VAC. - Continue home Omadacyclin - Wound VAC per vascular surgery

## 2023-02-15 NOTE — Telephone Encounter (Signed)
Patient has been notified to go to the ED

## 2023-02-15 NOTE — ED Notes (Signed)
Pt transport at this time by STEMI coordinator and Cardiologist to cath lab on pads and zoll. Stable at time of departure.

## 2023-02-16 ENCOUNTER — Encounter: Payer: Self-pay | Admitting: Cardiology

## 2023-02-16 ENCOUNTER — Inpatient Hospital Stay (HOSPITAL_COMMUNITY)
Admit: 2023-02-16 | Discharge: 2023-02-16 | Disposition: A | Discharge status: Home / Self Care | Payer: 59 | Attending: Cardiology | Admitting: Cardiology

## 2023-02-16 DIAGNOSIS — I2510 Atherosclerotic heart disease of native coronary artery without angina pectoris: Secondary | ICD-10-CM | POA: Diagnosis not present

## 2023-02-16 DIAGNOSIS — R9431 Abnormal electrocardiogram [ECG] [EKG]: Secondary | ICD-10-CM | POA: Diagnosis not present

## 2023-02-16 DIAGNOSIS — T8740 Infection of amputation stump, unspecified extremity: Secondary | ICD-10-CM | POA: Diagnosis not present

## 2023-02-16 DIAGNOSIS — I998 Other disorder of circulatory system: Secondary | ICD-10-CM | POA: Diagnosis not present

## 2023-02-16 DIAGNOSIS — E669 Obesity, unspecified: Secondary | ICD-10-CM

## 2023-02-16 DIAGNOSIS — I480 Paroxysmal atrial fibrillation: Secondary | ICD-10-CM

## 2023-02-16 LAB — CBC WITH DIFFERENTIAL/PLATELET
Abs Immature Granulocytes: 0.01 10*3/uL (ref 0.00–0.07)
Basophils Absolute: 0 10*3/uL (ref 0.0–0.1)
Basophils Relative: 0 %
Eosinophils Absolute: 0.3 10*3/uL (ref 0.0–0.5)
Eosinophils Relative: 4 %
HCT: 30.5 % — ABNORMAL LOW (ref 36.0–46.0)
Hemoglobin: 9.5 g/dL — ABNORMAL LOW (ref 12.0–15.0)
Immature Granulocytes: 0 %
Lymphocytes Relative: 25 %
Lymphs Abs: 1.6 10*3/uL (ref 0.7–4.0)
MCH: 28.2 pg (ref 26.0–34.0)
MCHC: 31.1 g/dL (ref 30.0–36.0)
MCV: 90.5 fL (ref 80.0–100.0)
Monocytes Absolute: 0.4 10*3/uL (ref 0.1–1.0)
Monocytes Relative: 6 %
Neutro Abs: 4.3 10*3/uL (ref 1.7–7.7)
Neutrophils Relative %: 65 %
Platelets: 220 10*3/uL (ref 150–400)
RBC: 3.37 MIL/uL — ABNORMAL LOW (ref 3.87–5.11)
RDW: 16.6 % — ABNORMAL HIGH (ref 11.5–15.5)
WBC: 6.6 10*3/uL (ref 4.0–10.5)
nRBC: 0 % (ref 0.0–0.2)

## 2023-02-16 LAB — ECHOCARDIOGRAM COMPLETE
AR max vel: 2.69 cm2
AV Area VTI: 3.02 cm2
AV Area mean vel: 2.89 cm2
AV Mean grad: 3 mmHg
AV Peak grad: 6.1 mmHg
Ao pk vel: 1.24 m/s
Area-P 1/2: 2.68 cm2
Height: 62 in
MV VTI: 2.47 cm2
S' Lateral: 2.3 cm
Weight: 3439.18 oz

## 2023-02-16 LAB — APTT
aPTT: 37 seconds — ABNORMAL HIGH (ref 24–36)
aPTT: 56 seconds — ABNORMAL HIGH (ref 24–36)
aPTT: 83 seconds — ABNORMAL HIGH (ref 24–36)

## 2023-02-16 LAB — HIV ANTIBODY (ROUTINE TESTING W REFLEX): HIV Screen 4th Generation wRfx: NONREACTIVE

## 2023-02-16 LAB — BASIC METABOLIC PANEL
Anion gap: 7 (ref 5–15)
BUN: 10 mg/dL (ref 8–23)
CO2: 28 mmol/L (ref 22–32)
Calcium: 8.1 mg/dL — ABNORMAL LOW (ref 8.9–10.3)
Chloride: 102 mmol/L (ref 98–111)
Creatinine, Ser: 0.5 mg/dL (ref 0.44–1.00)
GFR, Estimated: >60 mL/min (ref >60)
Glucose, Bld: 126 mg/dL — ABNORMAL HIGH (ref 70–99)
Potassium: 4.1 mmol/L (ref 3.5–5.1)
Sodium: 137 mmol/L (ref 135–145)

## 2023-02-16 LAB — LIPID PANEL
Cholesterol: 233 mg/dL — ABNORMAL HIGH (ref 0–200)
HDL: 34 mg/dL — ABNORMAL LOW (ref >40)
LDL Cholesterol: 121 mg/dL — ABNORMAL HIGH (ref 0–99)
Total CHOL/HDL Ratio: 6.9 RATIO
Triglycerides: 390 mg/dL — ABNORMAL HIGH (ref <150)
VLDL: 78 mg/dL — ABNORMAL HIGH (ref 0–40)

## 2023-02-16 LAB — HEMOGLOBIN A1C
Hgb A1c MFr Bld: 5.7 % — ABNORMAL HIGH (ref 4.8–5.6)
Mean Plasma Glucose: 116.89 mg/dL

## 2023-02-16 LAB — HEPARIN LEVEL (UNFRACTIONATED): Heparin Unfractionated: >1.1 IU/mL — ABNORMAL HIGH (ref 0.30–0.70)

## 2023-02-16 MED ORDER — HEPARIN BOLUS VIA INFUSION
2200.0000 [IU] | Freq: Once | INTRAVENOUS | Status: AC
  Administered 2023-02-16: 2200 [IU] via INTRAVENOUS
  Filled 2023-02-16: qty 2200

## 2023-02-16 MED ORDER — ALUM & MAG HYDROXIDE-SIMETH 200-200-20 MG/5ML PO SUSP
30.0000 mL | ORAL | Status: DC | PRN
  Administered 2023-02-16 – 2023-02-17 (×4): 30 mL via ORAL
  Filled 2023-02-16 (×4): qty 30

## 2023-02-16 MED ORDER — ACETAMINOPHEN 325 MG PO TABS
650.0000 mg | ORAL_TABLET | ORAL | Status: DC | PRN
  Administered 2023-02-23 – 2023-02-24 (×4): 650 mg via ORAL
  Filled 2023-02-16 (×4): qty 2

## 2023-02-16 MED ORDER — PANTOPRAZOLE SODIUM 40 MG PO TBEC
40.0000 mg | DELAYED_RELEASE_TABLET | Freq: Two times a day (BID) | ORAL | Status: DC
  Administered 2023-02-16 – 2023-02-24 (×16): 40 mg via ORAL
  Filled 2023-02-16 (×16): qty 1

## 2023-02-16 MED ORDER — DILTIAZEM HCL 30 MG PO TABS
30.0000 mg | ORAL_TABLET | Freq: Two times a day (BID) | ORAL | Status: DC
  Administered 2023-02-16 – 2023-02-24 (×12): 30 mg via ORAL
  Filled 2023-02-16 (×16): qty 1

## 2023-02-16 MED ORDER — NITROGLYCERIN 0.4 MG SL SUBL: 0.4000 mg | SUBLINGUAL_TABLET | SUBLINGUAL | Status: DC | PRN

## 2023-02-16 MED ORDER — HEPARIN BOLUS VIA INFUSION
1100.0000 [IU] | Freq: Once | INTRAVENOUS | Status: AC
  Administered 2023-02-16: 1100 [IU] via INTRAVENOUS
  Filled 2023-02-16: qty 1100

## 2023-02-16 NOTE — Progress Notes (Signed)
ANTICOAGULATION CONSULT NOTE - Initial Consult  Pharmacy Consult for IV heparin Indication: STEMI  Allergies  Allergen Reactions   Gabapentin     Tremors    Bactrim [Sulfamethoxazole-Trimethoprim] Itching    Patient Measurements: Height: 5\' 2"  (157.5 cm) Weight: 97.5 kg (214 lb 15.2 oz) IBW/kg (Calculated) : 50.1 Heparin Dosing Weight: 73.1 kg  Vital Signs: Temp: 97.8 F (36.6 C) (04/26 0000) Temp Source: Oral (04/25 2016) BP: 100/59 (04/26 0400) Pulse Rate: 60 (04/26 0400)  Labs: Recent Labs    02/15/23 1136 02/15/23 1427 02/16/23 0400  HGB 10.4*  --   --   HCT 34.2*  --   --   PLT 275  --   --   APTT  --   --  37*  LABPROT 14.6  --   --   INR 1.2  --   --   HEPARINUNFRC  --   --  >1.10*  CREATININE 0.57  --   --   CKTOTAL 27*  --   --   TROPONINIHS 4 9  --      Estimated Creatinine Clearance: 78.5 mL/min (by C-G formula based on SCr of 0.57 mg/dL).   Medical History: Past Medical History:  Diagnosis Date   Abdominal aortic atherosclerosis (HCC)    a. 05/2017 CTA abd/pelvis: significant atherosclerotic dzs of the infrarenal abd Ao w/ some mural thrombus. No aneurysm or dissection.   Acute focal ischemia of small intestine (HCC)    Acute right-sided low back pain with right-sided sciatica 06/13/2017   AKI (acute kidney injury) (HCC) 07/29/2017   Anemia    Anginal pain (HCC)    a. 08/2012 Lexiscan MV: EF 54%, non ischemia/infarct.   Anxiety and depression    Baker's cyst of knee, right    a. 07/2016 U/S: 4.1 x 1.4 x 2.9 cystic structure in R poplitetal fossa.   Bell's palsy    CAD (coronary artery disease)    CHF (congestive heart failure) (HCC)    a.) TTE 06/20/2017: EF 60-65%, no rwma, G1DD, no source of cardiac emboli.   Chronic anticoagulation    a.) apixaban   Chronic idiopathic constipation    Chronic mesenteric ischemia (HCC)    Chronic pain    COPD (chronic obstructive pulmonary disease) (HCC)    Dyspnea    Embolus of superior mesenteric  artery (HCC)    a. 05/2017 CTA Abd/pelvis: apparent thrombus or embolus in prox SMA (70-90%); b. 05/2017 catheter directed tPA, mechanical thrombectomy, and stenting of the SMA.   Essential hypertension with goal blood pressure less than 140/90 01/19/2016   Gastroesophageal reflux disease 01/19/2016   GERD (gastroesophageal reflux disease)    H/O colectomy    History of 2019 novel coronavirus disease (COVID-19) 11/14/2019   History of kidney stones    Hyperlipidemia    Hypertension    Morbid obesity with BMI of 40.0-44.9, adult (HCC)    NSTEMI (non-ST elevated myocardial infarction) (HCC) 06/27/2021   a.) high sensitivity troponins trended: 893 --> 1587 --> 1748 --> 868 --> 735 ng/L   Occlusive mesenteric ischemia (HCC) 06/19/2017   Osteoarthritis    PAD (peripheral artery disease) (HCC)    PAF (paroxysmal atrial fibrillation) (HCC)    a.) CHA2DS2-VASc = 6 (sex, CHF, HTN, DVT x2, aortic plaque). b.) rate/rhythm maintained on oral metoprolol tartrate; chronically anticoagulated with standard dose apixaban.   Palpitations    Peripheral edema    Personal history of other malignant neoplasm of skin 06/01/2016   Pneumonia  Pre-diabetes    Primary osteoarthritis of both knees 06/13/2017   SBO (small bowel obstruction) (HCC) 08/06/2017   Skin cancer of nose    Superior mesenteric artery thrombosis (HCC)    Vitamin D deficiency     Medications:  Eliquis 5 mg BID  Assessment: 62 year old female admitted with STEMI now s/p cath. PMH includes PAD s/p right AKA, CAD, HFpEF, HTN, HLD, atrial fibrillation on Eliquis prior to admission. Pharmacy has been consulted to start heparin 2 hours after radial band removal.  Goal of Therapy:  HL :  0.3 - 0.7 aPTT 66-85 seconds  Monitor platelets by anticoagulation protocol: Yes   Plan:  4/26 @ 0400:  aPTT = 37,  HL = > 1.10  - aPTT subtherapeutic,  HL elevated from Eliquis PTA - Will order heparin 2200 units IV X 1 bolus and increase drip rate to  1050 units/hr.  - Will recheck aPTT 6 hrs after rate change - Will recheck HL on 4/27 with AM labs.  - Use aPTT to guide dosing until correlating with HL CBC daily  Lucienne Sawyers D, PharmD Clinical Pharmacist   02/16/2023 5:37 AM

## 2023-02-16 NOTE — Progress Notes (Signed)
Progress Note   Patient: Carla [name] ZOX:[id number] DOB: 1961/05/23 DOA: 02/15/2023     1 DOS: the patient was seen and examined on 02/16/2023   Brief hospital course: 62 y.o. female with medical history significant of severe PAD s/p right AKA, CAD, HFpEF, hypertension, hyperlipidemia, paroxysmal atrial fibrillation on Eliquis, SMA thrombosis, who presents to the ED due to leg pain.   Ms. Hassey states she has been experiencing left lower extremity pain for the last 1 week, however it suddenly worsened this morning and that is why she called EMS.  She endorses chronic numbness and tingling that is unchanged.  In addition, she has been experiencing intermittent chest pain that occurs at rest and self resolves.  She denies any shortness of breath or palpitations.  She denies any nausea, vomiting, diarrhea.  She endorses some pain overlying her umbilical hernia but no other abdominal pain.  Cardiac catheterization showed a proximal RCA lesion 30% stenosed and a first diagonal lesion 50% stenosis, EF 65% by visual estimate.  ST elevation believed to be secondary to coronary spasm and Imdur was increased.  4/26.  Patient upset about vascular team talking about amputation of her left leg.  Patient still having pain left leg.  Assessment and Plan: * Acute lower limb ischemia Extensive history of PAD, with prior right AKA (June 2023) with recent stump revision (February 2024), and left femoral bypass mechanical thrombectomy (March 2024). Patient is presenting with 1 week of left lower extremity. - Oral and IV pain medications -Vascular team to set up for amputation.  ST elevation Patient presented with chest pain has been intermittent, in addition to ST elevation in the lateral leads.  Left heart cath demonstrated RCA spasm, resolved with nitroglycerin.  No stenting required.  - Echocardiogram ordered - Imdur increased to 120 mg daily - Continue home metoprolol  Coronary artery disease involving  native coronary artery of native heart - Nonobstructive CAD, continue aspirin, metoprolol, Imdur  Amputation stump infection (HCC) Stump wound covered by wound VAC, but no notable erythema.  - Continue home Omadacyclin - Wound VAC per vascular surgery   Paroxysmal atrial fibrillation (HCC) Heart rate well-controlled at this time.  - Continue home metoprolol - Anticoagulation with heparin  Chronic diastolic CHF (congestive heart failure) (HCC) Patient appears euvolemic at this time.   COPD (chronic obstructive pulmonary disease) (HCC) No wheezing on examination at this time.  - Continue home bronchodilators with DuoNebs as needed.  Chronic pain - Continue home hydrocodone-acetaminophen with Dilaudid for breakthrough pain given acute ischemia  Obesity (BMI 30-39.9) BMI 39.31        Subjective: Patient upset about needing an amputation of her left leg.  Came in with a few weeks worth of left leg pain.  Cardiac catheterization done for ST elevations came back without any need for intervention.  Physical Exam: Vitals:   02/16/23 0700 02/16/23 0715 02/16/23 0800 02/16/23 0900  BP: 124/71  112/86 (!) 121/104  Pulse: 68 85 78 80  Resp: 19 17 14 15   Temp:  97.9 F (36.6 C)    TempSrc:  Oral    SpO2: 91% 95% 95% 95%  Weight:      Height:       Physical Exam HENT:     Head: Normocephalic.     Mouth/Throat:     Pharynx: No oropharyngeal exudate.  Eyes:     General: Lids are normal.     Conjunctiva/sclera: Conjunctivae normal.  Cardiovascular:     Rate and  Rhythm: Normal rate and regular rhythm.     Heart sounds: Normal heart sounds, S1 normal and S2 normal.  Pulmonary:     Breath sounds: No decreased breath sounds, wheezing, rhonchi or rales.  Abdominal:     Palpations: Abdomen is soft.     Tenderness: There is no abdominal tenderness.  Musculoskeletal:     Left lower leg: Swelling present.     Right Lower Extremity: Right leg is amputated above knee.  Skin:     General: Skin is warm.     Comments: Small wound VAC over right stump.  Neurological:     Mental Status: She is alert and oriented to person, place, and time.    Data Reviewed: Last hemoglobin 10.4, last creatinine 0.57  Family Communication: Updated patient's daughter Gearldine Bienenstock on the phone.  She requested phone call from vascular team.  Disposition: Status is: Inpatient Remains inpatient appropriate because: Vascular team planning on amputation next week.  Continue IV heparin drip.  Planned Discharge Destination: To be determined    Time spent: 28 minutes  Author: Alford Highland, MD 02/16/2023 11:31 AM  For on call review [email or web address].

## 2023-02-16 NOTE — Consult Note (Addendum)
WOC Nurse Consult Note: Vascular team is following for assessment and plan of care for right AKA, which was performed in Feb.  Pt has been using a NPWT device at home for this wound prior to admission.  WOC team requested to perform dressing changes Q M/W/F. Removed patient's home machine/charger/cannister and placed in a plastic bag with her belongings for use again after discharge. Pt states she will have her daughter bring in a new cannister for use upon discharge so the soiled one can be discarded at that time.  Wound type: Full thickness post-op wound to right AKA; 100% beefy red, 2.5X4X.2cm, scant amt pink draiange.  Applied one piece black foam to cont suction.  Pt tolerated with minimal amt discomfort.   Dressing procedure/placement/frequency: WOC team will perform dressing changes as requested Q M/W/F. Thank-you,  Cammie [name] MSN, RN, CWOCN, [place], CNS [phone]

## 2023-02-16 NOTE — Plan of Care (Signed)
  Problem: Cardiovascular: Goal: Vascular access site(s) Level 0-1 will be maintained Outcome: Progressing   Problem: Nutrition: Goal: Adequate nutrition will be maintained Outcome: Progressing   Problem: Elimination: Goal: Will not experience complications related to urinary retention Outcome: Progressing

## 2023-02-16 NOTE — Progress Notes (Addendum)
Progress Note    02/16/2023 10:15 AM 1 Day Post-Op  Subjective: Carla [name] is a 62 year old female with medical history significant of severe PAD s/p right AKA, CAD, HFpEF, hypertension, hyperlipidemia, paroxysmal atrial fibrillation on Eliquis, SMA thrombosis, who presents to the ED due to leg pain.  Patient states she has been experiencing left lower extremity pain for the last 1 week, however it suddenly worsened this morning and that is why she called EMS.  She endorses chronic numbness and tingling that is unchanged.  In addition, she has been experiencing intermittent chest pain that occurs at rest and self resolves.  She denies any shortness of breath or palpitations.   On exam this morning patient is resting comfortably in bed.  Patient continues to endorse pain to her left lower extremity.  Patient is currently on heparin drip while her Eliquis for her A-fib is on hold.  Patient's vitals all remained stable.  Patient complains of pain to the left lower extremity due to ischemia.   Vitals:   02/16/23 0800 02/16/23 0900  BP: 112/86 (!) 121/104  Pulse: 78 80  Resp: 14 15  Temp:    SpO2: 95% 95%   Physical Exam: Cardiac:  AFIB, controlled rate, No murmurs  Lungs: Clear on auscultation throughout.  Without rales rhonchi or wheezing.  Nonlabored breathing. Incisions: NONE  Extremities:  Rt AKA, Left lower extremity without pulses femoral, popliteal, Post tibial and dorsalis pedis.  Abdomen:  Positive bowel sounds with positive hernia, soft, NT/ND, no masses Skin: without rashes Neurologic: A&O X 3; No focal weakness or paresthesias are detected; speech is fluent/normal   CBC    Component Value Date/Time   WBC 5.9 02/15/2023 1136   RBC 3.71 (L) 02/15/2023 1136   HGB 10.4 (L) 02/15/2023 1136   HGB 13.4 02/12/2015 1137   HCT 34.2 (L) 02/15/2023 1136   HCT 40.9 02/12/2015 1137   PLT 275 02/15/2023 1136   PLT 272 02/12/2015 1137   MCV 92.2 02/15/2023 1136   MCV 90  02/12/2015 1137   MCH 28.0 02/15/2023 1136   MCHC 30.4 02/15/2023 1136   RDW 17.0 (H) 02/15/2023 1136   RDW 14.8 (H) 02/12/2015 1137   LYMPHSABS 1.6 01/04/2023 1216   LYMPHSABS 1.7 08/27/2014 1145   MONOABS 0.4 01/04/2023 1216   MONOABS 0.4 08/27/2014 1145   EOSABS 0.3 01/04/2023 1216   EOSABS 0.0 08/27/2014 1145   BASOSABS 0.0 01/04/2023 1216   BASOSABS 0.0 08/27/2014 1145    BMET    Component Value Date/Time   NA 137 02/15/2023 1136   NA 140 02/12/2015 1137   K 4.5 02/15/2023 1136   K 2.9 (L) 02/12/2015 1137   CL 104 02/15/2023 1136   CL 107 02/12/2015 1137   CO2 25 02/15/2023 1136   CO2 27 02/12/2015 1137   GLUCOSE 112 (H) 02/15/2023 1136   GLUCOSE 120 (H) 02/12/2015 1137   BUN 14 02/15/2023 1136   BUN 15 02/12/2015 1137   CREATININE 0.57 02/15/2023 1136   CREATININE 0.78 02/12/2015 1137   CALCIUM 8.6 (L) 02/15/2023 1136   CALCIUM 9.0 02/12/2015 1137   GFRNONAA >60 02/15/2023 1136   GFRNONAA >60 02/12/2015 1137   GFRAA >60 11/23/2019 1330   GFRAA >60 02/12/2015 1137    INR    Component Value Date/Time   INR 1.2 02/15/2023 1136   INR 0.8 07/23/2012 0433     Intake/Output Summary (Last 24 hours) at 02/16/2023 1015 Last data filed at 02/16/2023 0600 Gross per  24 hour  Intake 1749.37 ml  Output 1300 ml  Net 449.37 ml     Assessment/Plan:  62 y.o. female is s/p  axillary to femoral bypass graft back in 07/23/2022.  On 01/17/2023 patient underwent left lower extremity angiogram with angioplasty and stent placement to restore blood flow.  He is now less than 1 month since his procedure and she has occluded again.  This occlusion occurs while she has been on daily aspirin 81 mg and Eliquis 5 mg twice daily.  1 Day Post-Op   Plan: Per Dr. Festus Barren MD at this time the patient will need an above-the-knee amputation of her left lower extremity.  Occlusion of her axilla to femoral bypass graft does not allow any revascularization option at this time.  I discussed this  with the patient this morning.  Patient became tearful and upset.  I informed the patient we would set this up for Wednesday, Feb 21, 2023.  It would be best patient stay on heparin at this point in time prior to surgery. Wound VAC to right lower extremity to be changed Monday Wednesday Friday by nursing staff.  Will also place consult to wound care.  DVT prophylaxis:  Heparin Infusion   [name] [facility] 02/16/2023 10:15 AM

## 2023-02-16 NOTE — Assessment & Plan Note (Signed)
BMI 39.31

## 2023-02-16 NOTE — Consult Note (Signed)
[facility] CLINIC CARDIOLOGY CONSULT NOTE       Patient ID: Carla [name] MRN: [id number] DOB/AGE: 63-Jul-1962 63 y.o.  Admit date: 02/15/2023 Referring Physician Dr. Alford Highland Primary Physician  Primary Cardiologist Dr. Juliann Pares Reason for Consultation medical optimization prior to leg amputation / code STEMI   HPI: Carla [name]. Vice is a 62yoF with a PMH of HFpEF, CAD (Non-obstructive by cath 04/2021), severe PAD s/p right AKA and left axillofemoral bypass, paroxysmal A-fib, COPD, chronic pain, morbid obesity who presented to [facility] ED via EMS on 02/15/2023 with acute on chronic left leg and foot pain with intermittent self resolving chest discomfort a few days prior.  Initial EKG showed subtle inferior ST elevations without reciprocal changes, and began having 7/10 chest pain during evaluation with the STEMI MD in the emergency department.  She was brought emergently to the Cath Lab where LHC revealed significant proximal RCA spasm exacerbated by the catheter placement which resolved the 90% lesion to 30% after IC nitroglycerin.  [facility] cardiology taking over care for further medical optimization prior to left leg amputation with vascular surgery.  History is obtained primarily from the patient's chart as she is sleeping soundly after receiving Norco and Dilaudid earlier in the morning.  She has been followed by Dr. Juliann Pares for her paroxysmal atrial fibrillation, HFpEF, and moderate CAD.  Her last left heart catheterization in July 2022 revealed a 60% proximal RCA lesion and 50% D1 lesion without significant disease elsewhere.  She has struggled with severe peripheral artery disease and is now s/p right AKA and left axillofemoral bypass although, her left leg is without dopplerable pulses, likely indicating occlusion of this graft.  She initially presented to [facility] ED with acute on chronic left leg pain with initial EKG transmitted by EMS with subtle inferior ST elevations without reciprocal changes.   The ED doctor contacted the on-call STEMI physician (Dr. Herbie Baltimore) regarding these EKG changes.  While he was evaluating the patient she complained of 7/10 chest pain and noted intermittent periods of fleeting chest discomfort over the past several days.  Based on her known coronary anatomy with 60% proximal RCA lesion, significant peripheral artery disease likely needing additional surgery, and EKG changes she was brought emergently to the Cath Lab on 4/25 by Dr. Herbie Baltimore.  LHC revealed significant proximal RCA spasm exacerbated by the catheter placement.  The spasm decreased from 90% lesion to a 30% lesion after IC nitroglycerin.  There was no additional significant CAD and no lesions requiring stent intervention.  Troponins remain negative at 4, 9.  Repeat EKG this morning with slightly more pronounced STE inferiorly still without reciprocal changes, reviewed my me and Dr. Juliann Pares.  Overnight, the patient remained hemodynamically stable.  At my time of evaluation she was sleeping soundly and snoring.  Awakens easily to voice and appears calm and comfortable.  Her main complaint is 7/10 left knee pain.  She admits to mild "indigestion" but no overt chest pain or pressure, shortness of breath, palpitations, and leg swelling when questioned, but quickly drifts off to sleep seconds later.    Review of systems complete and found to be negative unless listed above   Past Medical History:  Diagnosis Date   Abdominal aortic atherosclerosis (HCC)    a. 05/2017 CTA abd/pelvis: significant atherosclerotic dzs of the infrarenal abd Ao w/ some mural thrombus. No aneurysm or dissection.   Acute focal ischemia of small intestine (HCC)    Acute right-sided low back pain with right-sided sciatica 06/13/2017  AKI (acute kidney injury) (HCC) 07/29/2017   Anemia    Anginal pain (HCC)    a. 08/2012 Lexiscan MV: EF 54%, non ischemia/infarct.   Anxiety and depression    Baker's cyst of knee, right    a. 07/2016 U/S: 4.1  x 1.4 x 2.9 cystic structure in R poplitetal fossa.   Bell's palsy    CAD (coronary artery disease)    CHF (congestive heart failure) (HCC)    a.) TTE 06/20/2017: EF 60-65%, no rwma, G1DD, no source of cardiac emboli.   Chronic anticoagulation    a.) apixaban   Chronic idiopathic constipation    Chronic mesenteric ischemia (HCC)    Chronic pain    COPD (chronic obstructive pulmonary disease) (HCC)    Dyspnea    Embolus of superior mesenteric artery (HCC)    a. 05/2017 CTA Abd/pelvis: apparent thrombus or embolus in prox SMA (70-90%); b. 05/2017 catheter directed tPA, mechanical thrombectomy, and stenting of the SMA.   Essential hypertension with goal blood pressure less than 140/90 01/19/2016   Gastroesophageal reflux disease 01/19/2016   GERD (gastroesophageal reflux disease)    H/O colectomy    History of 2019 novel coronavirus disease (COVID-19) 11/14/2019   History of kidney stones    Hyperlipidemia    Hypertension    Morbid obesity with BMI of 40.0-44.9, adult (HCC)    NSTEMI (non-ST elevated myocardial infarction) (HCC) 06/27/2021   a.) high sensitivity troponins trended: 893 --> 1587 --> 1748 --> 868 --> 735 ng/L   Occlusive mesenteric ischemia (HCC) 06/19/2017   Osteoarthritis    PAD (peripheral artery disease) (HCC)    PAF (paroxysmal atrial fibrillation) (HCC)    a.) CHA2DS2-VASc = 6 (sex, CHF, HTN, DVT x2, aortic plaque). b.) rate/rhythm maintained on oral metoprolol tartrate; chronically anticoagulated with standard dose apixaban.   Palpitations    Peripheral edema    Personal history of other malignant neoplasm of skin 06/01/2016   Pneumonia    Pre-diabetes    Primary osteoarthritis of both knees 06/13/2017   SBO (small bowel obstruction) (HCC) 08/06/2017   Skin cancer of nose    Superior mesenteric artery thrombosis (HCC)    Vitamin D deficiency     Past Surgical History:  Procedure Laterality Date   AMPUTATION Right 04/06/2022   Procedure: AMPUTATION ABOVE KNEE;   Surgeon: Annice Needy, MD;  Location: [facility] ORS;  Service: Vascular;  Laterality: Right;   AMPUTATION  12/07/2022   Procedure: AMPUTATION ABOVE KNEE REVISION WITH RESECTION OF FEMUR;  Surgeon: Annice Needy, MD;  Location: [facility];  Service: Vascular;;   AMPUTATION TOE Left 12/09/2019   Procedure: AMPUTATION TOE MPJ left;  Surgeon: Rosetta Posner, DPM;  Location: [facility];  Service: Podiatry;  Laterality: Left;   APPENDECTOMY     APPLICATION OF WOUND VAC  12/01/2021   Procedure: APPLICATION OF WOUND VAC;  Surgeon: Annice Needy, MD;  Location: [facility];  Service: Vascular;;   APPLICATION OF WOUND VAC Right 05/03/2022   Procedure: APPLICATION OF WOUND VAC;  Surgeon: Annice Needy, MD;  Location: [facility];  Service: Vascular;  Laterality: Right;   APPLICATION OF WOUND VAC Left 07/23/2022   Procedure: APPLICATION OF WOUND VAC;  Surgeon: Nada Libman, MD;  Location: [facility];  Service: Vascular;  Laterality: Left;  Prevena   APPLICATION OF WOUND VAC Right 08/28/2022   Procedure: APPLICATION OF WOUND VAC;  Surgeon: Annice Needy, MD;  Location: [facility];  Service: Vascular;  Laterality: Right;  APPLICATION OF WOUND VAC  12/07/2022   Procedure: APPLICATION OF WOUND VAC;  Surgeon: Annice Needy, MD;  Location: [facility];  Service: Vascular;;   AXILLARY-FEMORAL BYPASS GRAFT Left 07/23/2022   Procedure: BYPASS GRAFT AXILLA-BIFEMORAL;  Surgeon: Nada Libman, MD;  Location: [facility];  Service: Vascular;  Laterality: Left;   CHOLECYSTECTOMY     COLON SURGERY     COLONOSCOPY WITH PROPOFOL N/A 03/23/2022   Procedure: COLONOSCOPY WITH PROPOFOL;  Surgeon: Wyline Mood, MD;  Location: [facility] ENDOSCOPY;  Service: Gastroenterology;  Laterality: N/A;  rectal bleed   CORONARY/GRAFT ACUTE MI REVASCULARIZATION N/A 02/15/2023   Procedure: Coronary/Graft Acute MI Revascularization;  Surgeon: Marykay Lex, MD;  Location: [facility] INV[facility]IVE CV LAB;  Service: Cardiovascular;  Laterality: N/A;   EMBOLECTOMY OR THROMBECTOMY, WITH OR  WITHOUT CATHETER; FEMOROPOPLITEAL, AORTOILIAC ARTERY, BY LEG INCISION N/A 06/21/2020   Left - Decomp Fasciotomy Leg; Ant &/Or Lat Compart Only; Midline - Angiography, Visceral, Selective Or Supraselective (With Or Without Flush Aortogram); Left - Revascularize, Endovasc, Open/Percut, Iliac Artery, Unilat, Initial Vessel; W/Translum Stent, W/Angioplasty; Right - Revascularize, Endovasc, Open/Percut, Iliac Artery, Ea Add`L Ipsilateral; W/Translumin Stent, W/Angioplasty; Location [facility];   ENDARTERECTOMY FEMORAL Left 12/01/2021   Procedure: ENDARTERECTOMY FEMORAL;  Surgeon: Annice Needy, MD;  Location: [facility];  Service: Vascular;  Laterality: Left;   ENDOVASCULAR REPAIR/STENT GRAFT Left 12/01/2021   Procedure: ENDOVASCULAR REPAIR/STENT GRAFT;  Surgeon: Annice Needy, MD;  Location: [facility];  Service: Cardiovascular;  Laterality: Left;   INCISION AND DRAINAGE OF WOUND Right 05/03/2022   Procedure: IRRIGATION AND DEBRIDEMENT RIGHT STUMP;  Surgeon: Annice Needy, MD;  Location: [facility];  Service: Vascular;  Laterality: Right;   IRRIGATION AND DEBRIDEMENT ABSCESS Right 08/28/2022   Procedure: AKA IRRIGATION AND DEBRIDEMENT ABSCESS;  Surgeon: Annice Needy, MD;  Location: [facility];  Service: Vascular;  Laterality: Right;   LAPAROTOMY N/A 08/08/2017   Procedure: EXPLORATORY LAPAROTOMY POSSIBLE BOWEL RESECTION;  Surgeon: Leafy Ro, MD;  Location: [facility];  Service: General;  Laterality: N/A;   LEFT HEART CATH AND CORONARY ANGIOGRAPHY N/A 05/17/2021   Procedure: LEFT HEART CATH AND CORONARY ANGIOGRAPHY;  Surgeon: Alwyn Pea, MD;  Location: [facility];  Service: Cardiovascular;  Laterality: N/A;   LEFT HEART CATH AND CORONARY ANGIOGRAPHY N/A 02/15/2023   Procedure: LEFT HEART CATH AND CORONARY ANGIOGRAPHY;  Surgeon: Marykay Lex, MD;  Location: [facility];  Service: Cardiovascular;  Laterality: N/A;   LOWER EXTREMITY ANGIOGRAPHY Left 11/17/2019   Procedure: LOWER  EXTREMITY ANGIOGRAPHY;  Surgeon: Annice Needy, MD;  Location: [facility];  Service: Cardiovascular;  Laterality: Left;   LOWER EXTREMITY ANGIOGRAPHY Right 12/13/2020   Procedure: LOWER EXTREMITY ANGIOGRAPHY;  Surgeon: Annice Needy, MD;  Location: [facility];  Service: Cardiovascular;  Laterality: Right;   LOWER EXTREMITY ANGIOGRAPHY Right 01/10/2021   Procedure: LOWER EXTREMITY ANGIOGRAPHY;  Surgeon: Annice Needy, MD;  Location: [facility];  Service: Cardiovascular;  Laterality: Right;   LOWER EXTREMITY ANGIOGRAPHY Right 01/24/2021   Procedure: LOWER EXTREMITY ANGIOGRAPHY;  Surgeon: Annice Needy, MD;  Location: [facility];  Service: Cardiovascular;  Laterality: Right;   LOWER EXTREMITY ANGIOGRAPHY Bilateral 06/28/2021   Procedure: Lower Extremity Angiography;  Surgeon: Annice Needy, MD;  Location: [facility];  Service: Cardiovascular;  Laterality: Bilateral;   LOWER EXTREMITY ANGIOGRAPHY Right 11/28/2021   Procedure: LOWER EXTREMITY ANGIOGRAPHY;  Surgeon: Annice Needy,  MD;  Location: [facility];  Service: Cardiovascular;  Laterality: Right;   LOWER EXTREMITY ANGIOGRAPHY Left 11/30/2021   Procedure: Lower Extremity Angiography;  Surgeon: Annice Needy, MD;  Location: [facility];  Service: Cardiovascular;  Laterality: Left;   LOWER EXTREMITY ANGIOGRAPHY Right 02/22/2022   Procedure: Lower Extremity Angiography;  Surgeon: Annice Needy, MD;  Location: [facility];  Service: Cardiovascular;  Laterality: Right;   LOWER EXTREMITY ANGIOGRAPHY Right 03/13/2022   Procedure: Lower Extremity Angiography;  Surgeon: Annice Needy, MD;  Location: [facility];  Service: Cardiovascular;  Laterality: Right;   LOWER EXTREMITY ANGIOGRAPHY Right 03/24/2022   Procedure: Lower Extremity Angiography;  Surgeon: Annice Needy, MD;  Location: [facility];  Service: Cardiovascular;  Laterality: Right;   LOWER EXTREMITY ANGIOGRAPHY Left  01/17/2023   Procedure: Lower Extremity Angiography;  Surgeon: Annice Needy, MD;  Location: [facility];  Service: Cardiovascular;  Laterality: Left;   LOWER EXTREMITY INTERVENTION N/A 11/19/2019   Procedure: LOWER EXTREMITY INTERVENTION;  Surgeon: Annice Needy, MD;  Location: [facility];  Service: Cardiovascular;  Laterality: N/A;   LOWER EXTREMITY INTERVENTION Bilateral 06/29/2021   Procedure: LOWER EXTREMITY INTERVENTION;  Surgeon: Annice Needy, MD;  Location: [facility];  Service: Cardiovascular;  Laterality: Bilateral;   LOWER EXTREMITY INTERVENTION Left 01/18/2023   Procedure: LOWER EXTREMITY INTERVENTION;  Surgeon: Annice Needy, MD;  Location: [facility];  Service: Cardiovascular;  Laterality: Left;   TEE WITHOUT CARDIOVERSION N/A 06/22/2017   Procedure: TRANSESOPHAGEAL ECHOCARDIOGRAM (TEE);  Surgeon: Iran Ouch, MD;  Location: [facility];  Service: Cardiovascular;  Laterality: N/A;   TOTAL KNEE ARTHROPLASTY Right 07/11/2018   Procedure: TOTAL KNEE ARTHROPLASTY;  Surgeon: Christena Flake, MD;  Location: [facility];  Service: Orthopedics;  Laterality: Right;   VAGINAL HYSTERECTOMY     VISCERAL ARTERY INTERVENTION N/A 06/20/2017   Procedure: Visceral Artery Intervention, possible aortic thrombectomy;  Surgeon: Annice Needy, MD;  Location: [facility];  Service: Cardiovascular;  Laterality: N/A;   VISCERAL ARTERY INTERVENTION N/A 01/28/2018   Procedure: VISCERAL ARTERY INTERVENTION;  Surgeon: Annice Needy, MD;  Location: [facility];  Service: Cardiovascular;  Laterality: N/A;    Facility-Administered Medications Prior to Admission  Medication Dose Route Frequency Provider Last Rate Last Admin   Vitamin D (Ergocalciferol) (DRISDOL) 1.25 MG (50000 UNIT) capsule 50,000 Units  50,000 Units Oral Once per day on Mon Wed Fri Pace, Brien R, NP       Medications Prior to Admission  Medication Sig Dispense Refill Last Dose   apixaban (ELIQUIS)  5 MG TABS tablet Take 1 tablet (5 mg total) by mouth 2 (two) times daily. 60 tablet 11 02/15/2023 at 0900   aspirin EC 81 MG tablet Take 81 mg by mouth daily. Swallow whole.   02/15/2023   COMBIVENT RESPIMAT 20-100 MCG/ACT AERS respimat Inhale 1 puff into the lungs every 6 (six) hours as needed.   unknown   diphenoxylate-atropine (LOMOTIL) 2.5-0.025 MG tablet Take 1 tablet by mouth 4 (four) times daily.   02/15/2023   esomeprazole (NEXIUM) 20 MG capsule Take 20 mg by mouth daily.   02/15/2023   ferrous sulfate 325 (65 FE) MG tablet Take 325 mg by mouth daily.   02/15/2023   HYDROcodone-acetaminophen (NORCO) 10-325 MG tablet Take 1 tablet by mouth every 4 (four) hours as needed.   02/15/2023   lidocaine (LIDODERM)  5 % Place 1 patch onto the skin daily. Remove & Discard patch within 12 hours or as directed by MD 30 patch 0 unknown   metoprolol tartrate (LOPRESSOR) 50 MG tablet Take 50 mg by mouth 2 (two) times daily.   02/15/2023   Omadacycline Tosylate (NUZYRA) 150 MG TABS Take 2 tablets (300 mg total) by mouth daily. 28 tablet 0 02/14/2023   pregabalin (LYRICA) 200 MG capsule Take 1 capsule (200 mg total) by mouth in the morning, at noon, and at bedtime. 90 capsule 3 02/15/2023   alum & mag hydroxide-simeth (MAALOX/MYLANTA) 200-200-20 MG/5ML suspension Take 15-30 mLs by mouth every 2 (two) hours as needed for indigestion. (Patient not taking: Reported on 02/15/2023) 355 mL 0 Not Taking   atorvastatin (LIPITOR) 80 MG tablet Take 80 mg by mouth at bedtime. (Patient not taking: Reported on 02/15/2023)   Not Taking   bisacodyl (DULCOLAX) 5 MG EC tablet Take 1 tablet (5 mg total) by mouth daily as needed for moderate constipation. (Patient not taking: Reported on 02/15/2023) 30 tablet 0 Not Taking   citalopram (CELEXA) 40 MG tablet Take 1 tablet (40 mg total) by mouth daily. 30 tablet 2    feeding supplement (ENSURE ENLIVE / ENSURE PLUS) LIQD Take 237 mLs by mouth 2 (two) times daily between meals. 237 mL 12     fluconazole (DIFLUCAN) 150 MG tablet Take 150 mg by mouth every 3 (three) days. (Patient not taking: Reported on 02/15/2023)   Not Taking   isosorbide mononitrate (IMDUR) 60 MG 24 hr tablet Take 60 mg by mouth daily. (Patient not taking: Reported on 02/15/2023)   Not Taking   metroNIDAZOLE (FLAGYL) 500 MG tablet Take 1 tablet (500 mg total) by mouth 2 (two) times daily. (Patient not taking: Reported on 02/15/2023) 28 tablet 0 Not Taking   oxyCODONE (ROXICODONE) 5 MG immediate release tablet Take 1-2 tablets (5-10 mg total) by mouth every 4 (four) hours as needed for severe pain. (Patient not taking: Reported on 02/15/2023) 30 tablet 0 Not Taking   oxyCODONE-acetaminophen (PERCOCET/ROXICET) 5-325 MG tablet Take 1-2 tablets by mouth every 4 (four) hours as needed for severe pain. (Patient not taking: Reported on 02/15/2023) 24 tablet 0 Not Taking   polyethylene glycol (MIRALAX / GLYCOLAX) 17 g packet Take 17 g by mouth daily. (Patient not taking: Reported on 02/15/2023) 14 each 0 Not Taking   Social History   Socioeconomic History   Marital status: Single    Spouse name: Not on file   Number of children: Not on file   Years of education: Not on file   Highest education level: Not on file  Occupational History   Occupation: Unemployed  Tobacco Use   Smoking status: Former    Packs/day: 1    Types: Cigarettes    Quit date: 2023    Years since quitting: 1.3   Smokeless tobacco: Never  Vaping Use   Vaping Use: Never used  Substance and Sexual Activity   Alcohol use: No   Drug use: No   Sexual activity: Not on file  Other Topics Concern   Not on file  Social History Narrative   Lives in [place] with daughter   Social Determinants of Health   Financial Resource Strain: Not on file  Food Insecurity: No Food Insecurity (01/17/2023)   Hunger Vital Sign    Worried About Running Out of Food in the Last Year: Never true    Ran Out of Food in the Last Year:  Never true  Transportation  Needs: No Transportation Needs (01/17/2023)   PRAPARE - Administrator, Civil Service (Medical): No    Lack of Transportation (Non-Medical): No  Physical Activity: Not on file  Stress: Not on file  Social Connections: Not on file  Intimate Partner Violence: Not At Risk (01/17/2023)   Humiliation, Afraid, Rape, and Kick questionnaire    Fear of Current or Ex-Partner: No    Emotionally Abused: No    Physically Abused: No    Sexually Abused: No    Family History  Problem Relation Age of Onset   Hypertension Mother    Heart disease Mother    Heart attack Mother    Breast cancer Mother    Heart disease Father    Hypertension Father    Breast cancer Paternal Aunt       Intake/Output Summary (Last 24 hours) at 02/16/2023 0852 Last data filed at 02/16/2023 0600 Gross per 24 hour  Intake 1749.37 ml  Output 1300 ml  Net 449.37 ml    Vitals:   02/16/23 0600 02/16/23 0700 02/16/23 0715 02/16/23 0800  BP: 133/74 124/71  112/86  Pulse: 82 68 85 78  Resp: 18 19 17 14   Temp:   97.9 F (36.6 C)   TempSrc:   Oral   SpO2: 93% 91% 95% 95%  Weight:      Height:        PHYSICAL EXAM General: Chronically ill-appearing obese Caucasian female, in no acute distress while sleeping soundly, open mouth snoring.  Awakens easily to voice HEENT:  Normocephalic and atraumatic. Neck:  No JVD.  Lungs: Normal respiratory effort on room air.  Decreased breath sounds bilaterally, exam limited with patient's level of sedation Heart: HRRR . Normal S1 and S2 without gallops or murmurs.  Abdomen: Non-distended appearing with excess adiposity.  Msk: Normal strength and tone for age. Extremities: Left leg cool to touch without dopplerable pulses, s/p right AKA with wound VAC in place. R wrist with gauze and tegaderm intact, 2+ R radial pulse proximally and distally to arteriotomy without active bleeding, significant ecchymosis or tenderness. Neuro: Somnolent, awakens to voice but quickly falls  asleep  Psych: Unable to assess  Labs: Basic Metabolic Panel: Recent Labs    02/15/23 1136  NA 137  K 4.5  CL 104  CO2 25  GLUCOSE 112*  BUN 14  CREATININE 0.57  CALCIUM 8.6*   Liver Function Tests: No results for input(s): "AST", "ALT", "ALKPHOS", "BILITOT", "PROT", "ALBUMIN" in the last 72 hours. No results for input(s): "LIPASE", "AMYLASE" in the last 72 hours. CBC: Recent Labs    02/15/23 1136  WBC 5.9  HGB 10.4*  HCT 34.2*  MCV 92.2  PLT 275   Cardiac Enzymes: Recent Labs    02/15/23 1136 02/15/23 1427  CKTOTAL 27*  --   TROPONINIHS 4 9   BNP: No results for input(s): "BNP" in the last 72 hours. D-Dimer: No results for input(s): "DDIMER" in the last 72 hours. Hemoglobin A1C: No results for input(s): "HGBA1C" in the last 72 hours. Fasting Lipid Panel: No results for input(s): "CHOL", "HDL", "LDLCALC", "TRIG", "CHOLHDL", "LDLDIRECT" in the last 72 hours. Thyroid Function Tests: No results for input(s): "TSH", "T4TOTAL", "T3FREE", "THYROIDAB" in the last 72 hours.  Invalid input(s): "FREET3" Anemia Panel: No results for input(s): "VITAMINB12", "FOLATE", "FERRITIN", "TIBC", "IRON", "RETICCTPCT" in the last 72 hours.   Radiology: CARDIAC CATHETERIZATION  Result Date: 02/15/2023   Prox RCA lesion is 30% stenosed.  1st Diag lesion is 50% stenosed.   The left ventricular systolic function is normal.   LV end diastolic pressure is normal.   The left ventricular ejection fraction is greater than 65% by visual estimate.   There is no aortic valve stenosis. Post-Cath Findings Severe spasm of the proximal RCA existing lesion (roughly 30%) => catheter related spasm was 90% reduced to 30% after IC NTG Mild D1 disease but otherwise no significant CAD. Normal LVEF with no RWMA and normal LVEDP.   I was able to exchanged for a guide catheter and gives IC NTG and the 90+ percent lesion resolved to about 30%.  This indicates that she very well has some coronary spasm, but  does not require PCI. => Recommendation would be to increase antispasm medications-I have increased Imdur to 120 mg.  Could also consider calcium channel blocker but will defer to her primary cardiologist.  She has an ongoing consult by vascular surgery and they are aware of the results. She will be admitted to the medicine service. She was on Eliquis preop, as I am not sure if she will need any procedures, I have not reordered Eliquis but it ordered for heparin and start 2 hours after TR band removal.  Patient's cardiologist is Dr. Juliann Pares from [facility], he is aware, but-would need for formal consult. I will continue her other home medication regimen.  She has lots of pain control medications but I will defer to the hospitalist service after admission. She is on 80 mg atorvastatin.  At home was on 60 mg of Imdur which I will increase to 120 mg.  Continue aspirin and low-dose Lopressor-limited by blood pressure. Bryan Lemma, MD  PERIPHERAL VASCULAR CATHETERIZATION  Result Date: 01/18/2023 See surgical note for result.  PERIPHERAL VASCULAR CATHETERIZATION  Result Date: 01/17/2023 See surgical note for result.   05/17/2021 LHC    Prox RCA lesion is 60% stenosed.   1st Diag lesion is 50% stenosed.   The left ventricular systolic function is normal.   LV end diastolic pressure is normal.   The left ventricular ejection fraction is 55-65% by visual estimate.   Conclusion Normal left ventricular function of at least 55% Coronaries Left main large free of disease LAD large minor irregularities Diagonal 1   50% ostial lesion Circumflex large free of disease RCA large 60-70% proximal Right dominant Intervention deferred Recommend medical therapy  ECHO 09/14/2022 INTERPRETATION  NORMAL LEFT VENTRICULAR SYSTOLIC FUNCTION  NORMAL RIGHT VENTRICULAR SYSTOLIC FUNCTION  TRIVIAL REGURGITATION NOTED (See above)  NO VALVULAR STENOSIS  TRIVIAL MR, TR  EF >55%   _________________________________________________________________________________________  Electronically signed by    Dorothyann Peng, MD on 09/14/2021 04: 41 PM           Performed By: Merla Riches, RDCS     Ordering Physician: Dorothyann Peng   TELEMETRY reviewed by me (LT) 02/16/2023 : NSR 60s to 80s  EKG reviewed by me: NSR 1mm STE in II, III, avF without reciprocal changes, Repeat EKG this morning showing NSR with slightly more pronounced STE inferiorly still without reciprocal changes, reviewed my me and Dr. Juliann Pares.  Data reviewed by me (LT) 02/16/2023: prior cardiology notes, ED note, admission H&P, vascular surgery note, hospitalist progress note last 24h vitals tele labs imaging I/O   Principal Problem:   Acute lower limb ischemia Active Problems:   Paroxysmal atrial fibrillation (HCC)   COPD (chronic obstructive pulmonary disease) (HCC)   Chronic pain   Chronic diastolic CHF (congestive heart failure) (  HCC)   Coronary artery disease involving native coronary artery of native heart   Atherosclerosis of artery of extremity with rest pain (HCC)   Amputation stump infection (HCC)   ST elevation    ASSESSMENT AND PLAN:   Carla [name] is a 62yoF with a PMH of HFpEF, CAD (Non-obstructive by cath 04/2021), severe PAD s/p right AKA (June 2023), with recent stump revision (February 2024) and left axillofemoral bypass (March 2024), paroxysmal A-fib, COPD, chronic pain, morbid obesity who presented to [facility] ED via EMS on 02/15/2023 with acute on chronic left leg and foot pain with intermittent self resolving chest discomfort a few days prior.  Initial EKG showed subtle inferior ST elevations without reciprocal changes, and began having 7/10 chest pain during evaluation with the STEMI MD in the emergency department.  She was brought emergently to the Cath Lab where LHC revealed significant proximal RCA spasm exacerbated by the catheter placement which resolved the 90% lesion to 30% after  IC nitroglycerin.  [facility] cardiology taking over care for further medical optimization prior to left leg amputation with vascular surgery.  # Severe PAD # S/p right AKA and left axillofemoral bypass # Medical optimization prior to vascular surgery Followed closely by vascular surgery with multiple prior interventions and an attempt at limb salvage.  Now left lower extremity is pulseless and vascular surgery is recommending left lower extremity amputation, likely Tuesday or Wednesday next week.  From a cardiac perspective, will obtain echocardiogram to evaluate LVEF, estimate by LV gram yesterday was 65%, and historically has preserved EF. -Continue aspirin 81 mg daily, indefinitely -Continue heparin per vascular surgery, also for anticoagulation with Hx paroxysmal AF -Continue atorvastatin 80 mg daily, recommend PCSK9 inhibitor as lipids are not at goal (LDL 121, triglycerides 390) -Change metoprolol to Cardizem 30 mg p.o. twice daily to decrease coronary spasm, uptitrate as BP allows -Continue isosorbide 90 mg daily, uptitrate as BP allows -Will augment GDMT based on echo results.  Could likely benefit from SGLT2i/GLP-1  -Check A1c -She is at mild-moderate risk for cardiovascular complications without further modifiable risk factors or additional procedures recommended other than an echocardiogram prior to proceeding with vascular surgery.  Continue aspirin and Cardizem, pre-, peri-, and post procedurally.  # RCA spasm # Abnormal EKG c/f early inferior STEMI Plan as above -aggressive risk factor modification.   # Paroxysmal AF In sinus rhythm on telemetry, rate controlled in the 60s-80s, heparin while inpatient in anticipation of left leg amputation, restart Eliquis 5 mg twice daily at discharge or when okay per vascular.  This patient's plan of care was discussed and created with Dr. Juliann Pares and he is in agreement.  Signed: Rebeca Allegra , PA-C 02/16/2023, 8:52 AM [facility]  Cardiology

## 2023-02-16 NOTE — Progress Notes (Signed)
*  PRELIMINARY RESULTS* Echocardiogram 2D Echocardiogram has been performed.  Cristela Blue 02/16/2023, 9:56 AM

## 2023-02-16 NOTE — Progress Notes (Signed)
Initial Nutrition Assessment  DOCUMENTATION CODES:   Obesity unspecified  INTERVENTION:   Ensure Enlive po BID, each supplement provides 350 kcal and 20 grams of protein.  Vitamin C 500mg  po BID  Zinc 220mg  po daily x 30 days  Vitamin D 50,000 units po weekly   Vitamin B12 1000 units po daily   Pt at refeed risk; recommend monitor potassium, magnesium and phosphorus labs daily until stable  NUTRITION DIAGNOSIS:   Increased nutrient needs related to wound healing as evidenced by estimated needs.  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  Met with pt in room today. Pt is well known to this RD from numerous previous admissions. Pt reports decreased appetite and oral intake pta r/t leg pain. Pt is complaining of pain during RD visit today. Pt reports that she did eat a little breakfast and lunch today. Pt is documented to be eating 100% of meals. Pt reports that she has not drank any Ensure but reports that she does like Ensure. RD discussed with pt the importance of adequate nutrition needed to preserve lean muscle and to support wound healing. RD will  add supplements and vitamins to help pt meet her estimated needs. Pt is at refeed risk. Vitamin labs checked last admission and were noted to be sufficient for wound healing. Pt is noted to have vitamin D deficiency, this is being supplemented. Per chart, pt appears weight stable pta.       Medications reviewed and include: vitamin C, aspirin, B12, ferrous sulfate, protonix, miralax, vitamin D, zinc, heparin   Labs reviewed: K 4.1 wnl Hgb 9.5(L), Ht 30.5(L) Vitamin D 14.43(L), vitamin A 36.3 wnl, B12 222 wnl, vitamin C 0.8 wnl, vitamin E 11.4 wnl, zinc 56 wnl, copper 155 wnl- 3/29   NUTRITION - FOCUSED PHYSICAL EXAM:  Flowsheet Row Most Recent Value  Orbital Region No depletion  Upper Arm Region No depletion  Thoracic and Lumbar Region No depletion  Buccal Region No depletion  Temple Region No depletion  Clavicle Bone Region No depletion  Clavicle and Acromion Bone Region No depletion  Scapular Bone Region No depletion  Dorsal Hand No depletion  Patellar Region No depletion  Anterior Thigh Region No depletion  Posterior Calf Region No depletion  Edema (RD Assessment) Mild  Hair Reviewed  Eyes Reviewed  Mouth Reviewed  Skin Reviewed  Nails Reviewed   Diet Order:   Diet Order             Diet Heart Room service appropriate? Yes; Fluid consistency: Thin  Diet effective now                  EDUCATION NEEDS:   Education needs have been addressed  Skin:  Skin Assessment: Reviewed RN Assessment (ecchymosis, stump wound 2.5X4X.2cm)  Last BM:  4/24  Height:   Ht Readings from Last 1 Encounters:  02/15/23 5\' 2"  (1.575 m)    Weight:   Wt Readings from Last 1 Encounters:  02/15/23 97.5 kg    Ideal Body Weight:  50 kg  BMI:  Body mass index is 39.31 kg/m.  Estimated Nutritional Needs:   Kcal:  1900-2200kcal/day  Protein:  95-110g/day  Fluid:  1.5-1.8L/day  Betsey Holiday MS, RD, LDN Please refer to [facility] for RD and/or RD on-call/weekend/after hours  pager

## 2023-02-16 NOTE — Progress Notes (Signed)
ANTICOAGULATION CONSULT NOTE -   Pharmacy Consult for IV heparin Indication: STEMI  Allergies  Allergen Reactions   Gabapentin     Tremors    Bactrim [Sulfamethoxazole-Trimethoprim] Itching    Patient Measurements: Height: 5\' 2"  (157.5 cm) Weight: 97.5 kg (214 lb 15.2 oz) IBW/kg (Calculated) : 50.1 Heparin Dosing Weight: 73.1 kg  Vital Signs: Temp: 97.9 F (36.6 C) (04/26 0715) Temp Source: Oral (04/26 0715) BP: 106/54 (04/26 1100) Pulse Rate: 71 (04/26 1100)  Labs: Recent Labs    02/15/23 1136 02/15/23 1427 02/16/23 0400 02/16/23 1141 02/16/23 1149  HGB 10.4*  --   --  9.5*  --   HCT 34.2*  --   --  30.5*  --   PLT 275  --   --  220  --   APTT  --   --  37*  --  56*  LABPROT 14.6  --   --   --   --   INR 1.2  --   --   --   --   HEPARINUNFRC  --   --  >1.10*  --   --   CREATININE 0.57  --   --  0.50  --   CKTOTAL 27*  --   --   --   --   TROPONINIHS 4 9  --   --   --      Estimated Creatinine Clearance: 78.5 mL/min (by C-G formula based on SCr of 0.5 mg/dL).   Medical History: Past Medical History:  Diagnosis Date   Abdominal aortic atherosclerosis (HCC)    a. 05/2017 CTA abd/pelvis: significant atherosclerotic dzs of the infrarenal abd Ao w/ some mural thrombus. No aneurysm or dissection.   Acute focal ischemia of small intestine (HCC)    Acute right-sided low back pain with right-sided sciatica 06/13/2017   AKI (acute kidney injury) (HCC) 07/29/2017   Anemia    Anginal pain (HCC)    a. 08/2012 Lexiscan MV: EF 54%, non ischemia/infarct.   Anxiety and depression    Baker's cyst of knee, right    a. 07/2016 U/S: 4.1 x 1.4 x 2.9 cystic structure in R poplitetal fossa.   Bell's palsy    CAD (coronary artery disease)    CHF (congestive heart failure) (HCC)    a.) TTE 06/20/2017: EF 60-65%, no rwma, G1DD, no source of cardiac emboli.   Chronic anticoagulation    a.) apixaban   Chronic idiopathic constipation    Chronic mesenteric ischemia (HCC)     Chronic pain    COPD (chronic obstructive pulmonary disease) (HCC)    Dyspnea    Embolus of superior mesenteric artery (HCC)    a. 05/2017 CTA Abd/pelvis: apparent thrombus or embolus in prox SMA (70-90%); b. 05/2017 catheter directed tPA, mechanical thrombectomy, and stenting of the SMA.   Essential hypertension with goal blood pressure less than 140/90 01/19/2016   Gastroesophageal reflux disease 01/19/2016   GERD (gastroesophageal reflux disease)    H/O colectomy    History of 2019 novel coronavirus disease (COVID-19) 11/14/2019   History of kidney stones    Hyperlipidemia    Hypertension    Morbid obesity with BMI of 40.0-44.9, adult (HCC)    NSTEMI (non-ST elevated myocardial infarction) (HCC) 06/27/2021   a.) high sensitivity troponins trended: 893 --> 1587 --> 1748 --> 868 --> 735 ng/L   Occlusive mesenteric ischemia (HCC) 06/19/2017   Osteoarthritis    PAD (peripheral artery disease) (HCC)    PAF (paroxysmal atrial  fibrillation) (HCC)    a.) CHA2DS2-VASc = 6 (sex, CHF, HTN, DVT x2, aortic plaque). b.) rate/rhythm maintained on oral metoprolol tartrate; chronically anticoagulated with standard dose apixaban.   Palpitations    Peripheral edema    Personal history of other malignant neoplasm of skin 06/01/2016   Pneumonia    Pre-diabetes    Primary osteoarthritis of both knees 06/13/2017   SBO (small bowel obstruction) (HCC) 08/06/2017   Skin cancer of nose    Superior mesenteric artery thrombosis (HCC)    Vitamin D deficiency     Medications:  Eliquis 5 mg BID  Assessment: 62 year old female admitted with STEMI now s/p cath. PMH includes PAD s/p right AKA, CAD, HFpEF, HTN, HLD, atrial fibrillation on Eliquis prior to admission. Pharmacy has been consulted to start heparin 2 hours after radial band removal.  4/26 @ 0400:  aPTT = 37,  HL = > 1.10 rate inc to 1050 u/hr 4/26 1149 aPTT=56  subtherapeutic,  1050>>  Goal of Therapy:  HL :  0.3 - 0.7 aPTT 66-85 seconds   Monitor platelets by anticoagulation protocol: Yes   Plan:  4/26 @ 1149:  aPTT = 56 - aPTT subtherapeutic - Will order heparin 1100 units IV X 1 bolus and increase drip rate to 1200 units/hr.  - Will recheck aPTT 6 hrs after rate change - Will recheck HL on 4/27 with AM labs.  -planning for AKA 02/21/23 and maintain on heparin drip until then - Use aPTT to guide dosing until correlating with HL CBC daily  Angelique Blonder, PharmD Clinical Pharmacist  02/16/2023 12:44 PM

## 2023-02-16 NOTE — Hospital Course (Signed)
62 y.o. female with medical history significant of severe PAD s/p right AKA, CAD, HFpEF, hypertension, hyperlipidemia, paroxysmal atrial fibrillation on Eliquis, SMA thrombosis, who presents to the ED due to leg pain.   Carla [name] states she has been experiencing left lower extremity pain for the last 1 week, however it suddenly worsened this morning and that is why she called EMS.  She endorses chronic numbness and tingling that is unchanged.  In addition, she has been experiencing intermittent chest pain that occurs at rest and self resolves.  She denies any shortness of breath or palpitations.  She denies any nausea, vomiting, diarrhea.  She endorses some pain overlying her umbilical hernia but no other abdominal pain.  Cardiac catheterization showed a proximal RCA lesion 30% stenosed and a first diagonal lesion 50% stenosis, EF 65% by visual estimate.  ST elevation believed to be secondary to coronary spasm and Imdur was increased.  4/26.  Patient upset about vascular team talking about amputation of her left leg.  Patient still having pain left leg. 4/27.  Patient still very upset but agreeable to amputation.  Still having pain in left leg. 4/28.  Patient stating she wants to go up on her oral pain medications because she is in severe pain with her left leg.  She was on Norco 07/25/2024 and I will switch to oxycodone 15 mg every 4 hours as needed for pain. 4/29.  Patient states he is in a lot of pain with her left knee.  Does not want to change pain medications currently. 4/30.  Patient still has not had a bowel movement despite MiraLAX.  Will increase MiraLAX to twice daily.  Will give a dose of lactulose.

## 2023-02-16 NOTE — Progress Notes (Signed)
ANTICOAGULATION CONSULT NOTE -   Pharmacy Consult for IV heparin Indication: STEMI  Allergies  Allergen Reactions   Gabapentin     Tremors    Bactrim [Sulfamethoxazole-Trimethoprim] Itching    Patient Measurements: Height: 5\' 2"  (157.5 cm) Weight: 97.5 kg (214 lb 15.2 oz) IBW/kg (Calculated) : 50.1 Heparin Dosing Weight: 73.1 kg  Vital Signs: Temp: 97.8 F (36.6 C) (04/26 2030) BP: 105/52 (04/26 2030) Pulse Rate: 78 (04/26 2030)  Labs: Recent Labs    02/15/23 1136 02/15/23 1427 02/16/23 0400 02/16/23 1141 02/16/23 1149 02/16/23 2012  HGB 10.4*  --   --  9.5*  --   --   HCT 34.2*  --   --  30.5*  --   --   PLT 275  --   --  220  --   --   APTT  --   --  37*  --  56* 83*  LABPROT 14.6  --   --   --   --   --   INR 1.2  --   --   --   --   --   HEPARINUNFRC  --   --  >1.10*  --   --   --   CREATININE 0.57  --   --  0.50  --   --   CKTOTAL 27*  --   --   --   --   --   TROPONINIHS 4 9  --   --   --   --      Estimated Creatinine Clearance: 78.5 mL/min (by C-G formula based on SCr of 0.5 mg/dL).   Medical History: Past Medical History:  Diagnosis Date   Abdominal aortic atherosclerosis (HCC)    a. 05/2017 CTA abd/pelvis: significant atherosclerotic dzs of the infrarenal abd Ao w/ some mural thrombus. No aneurysm or dissection.   Acute focal ischemia of small intestine (HCC)    Acute right-sided low back pain with right-sided sciatica 06/13/2017   AKI (acute kidney injury) (HCC) 07/29/2017   Anemia    Anginal pain (HCC)    a. 08/2012 Lexiscan MV: EF 54%, non ischemia/infarct.   Anxiety and depression    Baker's cyst of knee, right    a. 07/2016 U/S: 4.1 x 1.4 x 2.9 cystic structure in R poplitetal fossa.   Bell's palsy    CAD (coronary artery disease)    CHF (congestive heart failure) (HCC)    a.) TTE 06/20/2017: EF 60-65%, no rwma, G1DD, no source of cardiac emboli.   Chronic anticoagulation    a.) apixaban   Chronic idiopathic constipation    Chronic  mesenteric ischemia (HCC)    Chronic pain    COPD (chronic obstructive pulmonary disease) (HCC)    Dyspnea    Embolus of superior mesenteric artery (HCC)    a. 05/2017 CTA Abd/pelvis: apparent thrombus or embolus in prox SMA (70-90%); b. 05/2017 catheter directed tPA, mechanical thrombectomy, and stenting of the SMA.   Essential hypertension with goal blood pressure less than 140/90 01/19/2016   Gastroesophageal reflux disease 01/19/2016   GERD (gastroesophageal reflux disease)    H/O colectomy    History of 2019 novel coronavirus disease (COVID-19) 11/14/2019   History of kidney stones    Hyperlipidemia    Hypertension    Morbid obesity with BMI of 40.0-44.9, adult (HCC)    NSTEMI (non-ST elevated myocardial infarction) (HCC) 06/27/2021   a.) high sensitivity troponins trended: 893 --> 1587 --> 1748 --> 868 --> 735  ng/L   Occlusive mesenteric ischemia (HCC) 06/19/2017   Osteoarthritis    PAD (peripheral artery disease) (HCC)    PAF (paroxysmal atrial fibrillation) (HCC)    a.) CHA2DS2-VASc = 6 (sex, CHF, HTN, DVT x2, aortic plaque). b.) rate/rhythm maintained on oral metoprolol tartrate; chronically anticoagulated with standard dose apixaban.   Palpitations    Peripheral edema    Personal history of other malignant neoplasm of skin 06/01/2016   Pneumonia    Pre-diabetes    Primary osteoarthritis of both knees 06/13/2017   SBO (small bowel obstruction) (HCC) 08/06/2017   Skin cancer of nose    Superior mesenteric artery thrombosis (HCC)    Vitamin D deficiency     Medications:  Eliquis 5 mg BID  Assessment: 62 year old female admitted with STEMI now s/p cath. PMH includes PAD s/p right AKA, CAD, HFpEF, HTN, HLD, atrial fibrillation on Eliquis prior to admission. Pharmacy has been consulted to start heparin 2 hours after radial band removal.  4/26 @ 0400:  aPTT = 37,  HL = > 1.10 rate inc to 1050 u/hr 4/26 1149 aPTT=56  subtherapeutic,  1050>> 4/26 2012 aPTT 83  Goal of  Therapy:  HL :  0.3 - 0.7 aPTT 66-85 seconds  Monitor platelets by anticoagulation protocol: Yes   Plan:  aPTT subtherapeutic continue heparin infusion rate at 1200 units/hr.  Will recheck aPTT 6 hrs to confirm planning for AKA 02/21/23 and maintain on heparin drip until then Use aPTT to guide dosing until correlating with HL CBC daily  Sharen Hones, PharmD, BCPS Clinical Pharmacist   02/16/2023 8:44 PM

## 2023-02-17 DIAGNOSIS — I2510 Atherosclerotic heart disease of native coronary artery without angina pectoris: Secondary | ICD-10-CM | POA: Diagnosis not present

## 2023-02-17 DIAGNOSIS — T8740 Infection of amputation stump, unspecified extremity: Secondary | ICD-10-CM | POA: Diagnosis not present

## 2023-02-17 DIAGNOSIS — R9431 Abnormal electrocardiogram [ECG] [EKG]: Secondary | ICD-10-CM | POA: Diagnosis not present

## 2023-02-17 DIAGNOSIS — J439 Emphysema, unspecified: Secondary | ICD-10-CM

## 2023-02-17 DIAGNOSIS — I998 Other disorder of circulatory system: Secondary | ICD-10-CM | POA: Diagnosis not present

## 2023-02-17 LAB — BASIC METABOLIC PANEL
Anion gap: 9 (ref 5–15)
BUN: 9 mg/dL (ref 8–23)
CO2: 24 mmol/L (ref 22–32)
Calcium: 8.5 mg/dL — ABNORMAL LOW (ref 8.9–10.3)
Chloride: 103 mmol/L (ref 98–111)
Creatinine, Ser: 0.57 mg/dL (ref 0.44–1.00)
GFR, Estimated: >60 mL/min (ref >60)
Glucose, Bld: 103 mg/dL — ABNORMAL HIGH (ref 70–99)
Potassium: 4.1 mmol/L (ref 3.5–5.1)
Sodium: 136 mmol/L (ref 135–145)

## 2023-02-17 LAB — APTT: aPTT: 76 seconds — ABNORMAL HIGH (ref 24–36)

## 2023-02-17 LAB — CBC
HCT: 33.9 % — ABNORMAL LOW (ref 36.0–46.0)
Hemoglobin: 10.2 g/dL — ABNORMAL LOW (ref 12.0–15.0)
MCH: 27.6 pg (ref 26.0–34.0)
MCHC: 30.1 g/dL (ref 30.0–36.0)
MCV: 91.6 fL (ref 80.0–100.0)
Platelets: 215 10*3/uL (ref 150–400)
RBC: 3.7 MIL/uL — ABNORMAL LOW (ref 3.87–5.11)
RDW: 16.7 % — ABNORMAL HIGH (ref 11.5–15.5)
WBC: 5.2 10*3/uL (ref 4.0–10.5)
nRBC: 0 % (ref 0.0–0.2)

## 2023-02-17 LAB — HEPARIN LEVEL (UNFRACTIONATED): Heparin Unfractionated: >1.1 IU/mL — ABNORMAL HIGH (ref 0.30–0.70)

## 2023-02-17 NOTE — Progress Notes (Signed)
Progress Note   Patient: Carla [name] WGN:[id number] DOB: [birth year] DOA: 02/15/2023     2 DOS: the patient was seen and examined on 02/17/2023   Brief hospital course: 62 y.o. female with medical history significant of severe PAD s/p right AKA, CAD, HFpEF, hypertension, hyperlipidemia, paroxysmal atrial fibrillation on Eliquis, SMA thrombosis, who presents to the ED due to leg pain.   [name] states she has been experiencing left lower extremity pain for the last 1 week, however it suddenly worsened this morning and that is why she called EMS.  She endorses chronic numbness and tingling that is unchanged.  In addition, she has been experiencing intermittent chest pain that occurs at rest and self resolves.  She denies any shortness of breath or palpitations.  She denies any nausea, vomiting, diarrhea.  She endorses some pain overlying her umbilical hernia but no other abdominal pain.  Cardiac catheterization showed a proximal RCA lesion 30% stenosed and a first diagonal lesion 50% stenosis, EF 65% by visual estimate.  ST elevation believed to be secondary to coronary spasm and Imdur was increased.  4/26.  Patient upset about vascular team talking about amputation of her left leg.  Patient still having pain left leg. 4/27.  Patient still very upset but agreeable to amputation.  Still having pain in left leg.  Assessment and Plan: * Acute lower limb ischemia Extensive history of PAD, with prior right AKA (June 2023) with recent stump revision (February 2024), and left femoral bypass mechanical thrombectomy (March 2024). Patient is presenting with 1 week of left lower extremity. - Oral and IV pain medications -Vascular team set up for amputation for May 1.  ST elevation Patient presented with chest pain has been intermittent, in addition to ST elevation in the lateral leads.  Left heart cath demonstrated RCA spasm, resolved with nitroglycerin.  No stenting required. - Imdur increased to 90 mg  daily - Continue Cardizem if blood pressure allows.  Coronary artery disease involving native coronary artery of native heart - Nonobstructive CAD, continue aspirin, Imdur  Amputation stump infection (HCC) Stump wound covered by wound VAC. - Continue home Omadacyclin - Wound VAC per vascular surgery   Paroxysmal atrial fibrillation (HCC) - Cardizem if blood pressure allows. - Anticoagulation with heparin  Chronic diastolic CHF (congestive heart failure) (HCC) Patient appears euvolemic at this time.   COPD (chronic obstructive pulmonary disease) (HCC) Continue home bronchodilators with DuoNebs as needed.  Chronic pain Continue home hydrocodone-acetaminophen with Dilaudid for breakthrough pain given acute ischemia  Obesity, Class III, BMI 40-49.9 (morbid obesity) (HCC) BMI 41.49 with current height and weight.        Subjective: Patient again very tearful during my visit with her.  Agreeable to above-knee amputation on the left.  Still having a lot of pain in her left leg.  Admitted with acute ischemia left leg.  Physical Exam: Vitals:   02/17/23 0059 02/17/23 0418 02/17/23 0715 02/17/23 0844  BP: (!) 85/55 97/64  (!) 99/51  Pulse: 65 68  70  Resp: 18 18  16   Temp: 98.1 F (36.7 C) 97.7 F (36.5 C)  98 F (36.7 C)  TempSrc:      SpO2: 95% 97%  97%  Weight:   102.9 kg   Height:       Physical Exam HENT:     Head: Normocephalic.     Mouth/Throat:     Pharynx: No oropharyngeal exudate.  Eyes:     General: Lids are normal.  Conjunctiva/sclera: Conjunctivae normal.  Cardiovascular:     Rate and Rhythm: Normal rate and regular rhythm.     Heart sounds: Normal heart sounds, S1 normal and S2 normal.  Pulmonary:     Breath sounds: No decreased breath sounds, wheezing, rhonchi or rales.  Abdominal:     Palpations: Abdomen is soft.     Tenderness: There is no abdominal tenderness.  Musculoskeletal:     Left lower leg: Swelling present.     Right Lower  Extremity: Right leg is amputated above knee.  Skin:    General: Skin is warm.     Comments: Small wound VAC over right stump.  Neurological:     Mental Status: She is alert and oriented to person, place, and time.     Data Reviewed: Hemoglobin 10.2, creatinine 0.57  Family Communication: Left message for patient's daughter  Disposition: Status is: Inpatient Remains inpatient appropriate because: Will have left AKA likely on Wednesday next week  Planned Discharge Destination: To be determined    Time spent: 28 minutes Case discussed with nursing staff  Author: Alford Highland, MD 02/17/2023 11:25 AM  For on call review [email or web address].

## 2023-02-17 NOTE — Plan of Care (Signed)
  Problem: Education: Goal: Understanding of CV disease, CV risk reduction, and recovery process will improve Outcome: Progressing   Problem: Education: Goal: Knowledge of General Education information will improve Description: Including pain rating scale, medication(s)/side effects and non-pharmacologic comfort measures Outcome: Progressing   Problem: Clinical Measurements: Goal: Ability to maintain clinical measurements within normal limits will improve Outcome: Progressing   Problem: Nutrition: Goal: Adequate nutrition will be maintained Outcome: Progressing   Problem: Safety: Goal: Ability to remain free from injury will improve Outcome: Progressing

## 2023-02-17 NOTE — Assessment & Plan Note (Signed)
BMI 41.49 with current height and weight.

## 2023-02-17 NOTE — Progress Notes (Signed)
Per MD hold diltiazem, pt BP is 99/51, Hr is 70.

## 2023-02-17 NOTE — Plan of Care (Signed)
  Problem: Activity: Goal: Ability to return to baseline activity level will improve Outcome: Progressing   Problem: Cardiovascular: Goal: Ability to achieve and maintain adequate cardiovascular perfusion will improve Outcome: Progressing Goal: Vascular access site(s) Level 0-1 will be maintained Outcome: Progressing   Problem: Clinical Measurements: Goal: Respiratory complications will improve Outcome: Progressing Goal: Cardiovascular complication will be avoided Outcome: Progressing   Problem: Pain Managment: Goal: General experience of comfort will improve Outcome: Progressing   Problem: Safety: Goal: Ability to remain free from injury will improve Outcome: Progressing   Problem: Skin Integrity: Goal: Risk for impaired skin integrity will decrease Outcome: Progressing

## 2023-02-17 NOTE — Progress Notes (Signed)
ANTICOAGULATION CONSULT NOTE -   Pharmacy Consult for IV heparin Indication: STEMI  Allergies  Allergen Reactions   Gabapentin     Tremors    Bactrim [Sulfamethoxazole-Trimethoprim] Itching    Patient Measurements: Height: 5\' 2"  (157.5 cm) Weight: 97.5 kg (214 lb 15.2 oz) IBW/kg (Calculated) : 50.1 Heparin Dosing Weight: 73.1 kg  Vital Signs: Temp: 98.1 F (36.7 C) (04/27 0059) BP: 85/55 (04/27 0059) Pulse Rate: 65 (04/27 0059)  Labs: Recent Labs    02/15/23 1136 02/15/23 1136 02/15/23 1427 02/16/23 0400 02/16/23 1141 02/16/23 1149 02/16/23 2012 02/17/23 0158 02/17/23 0159  HGB 10.4*  --   --   --  9.5*  --   --   --  10.2*  HCT 34.2*  --   --   --  30.5*  --   --   --  33.9*  PLT 275  --   --   --  220  --   --   --  215  APTT  --    < >  --  37*  --  56* 83*  --  76*  LABPROT 14.6  --   --   --   --   --   --   --   --   INR 1.2  --   --   --   --   --   --   --   --   HEPARINUNFRC  --   --   --  >1.10*  --   --   --  >1.10*  --   CREATININE 0.57  --   --   --  0.50  --   --   --  0.57  CKTOTAL 27*  --   --   --   --   --   --   --   --   TROPONINIHS 4  --  9  --   --   --   --   --   --    < > = values in this interval not displayed.     Estimated Creatinine Clearance: 78.5 mL/min (by C-G formula based on SCr of 0.57 mg/dL).   Medical History: Past Medical History:  Diagnosis Date   Abdominal aortic atherosclerosis (HCC)    a. 05/2017 CTA abd/pelvis: significant atherosclerotic dzs of the infrarenal abd Ao w/ some mural thrombus. No aneurysm or dissection.   Acute focal ischemia of small intestine (HCC)    Acute right-sided low back pain with right-sided sciatica 06/13/2017   AKI (acute kidney injury) (HCC) 07/29/2017   Anemia    Anginal pain (HCC)    a. 08/2012 Lexiscan MV: EF 54%, non ischemia/infarct.   Anxiety and depression    Baker's cyst of knee, right    a. 07/2016 U/S: 4.1 x 1.4 x 2.9 cystic structure in R poplitetal fossa.   Bell's palsy     CAD (coronary artery disease)    CHF (congestive heart failure) (HCC)    a.) TTE 06/20/2017: EF 60-65%, no rwma, G1DD, no source of cardiac emboli.   Chronic anticoagulation    a.) apixaban   Chronic idiopathic constipation    Chronic mesenteric ischemia (HCC)    Chronic pain    COPD (chronic obstructive pulmonary disease) (HCC)    Dyspnea    Embolus of superior mesenteric artery (HCC)    a. 05/2017 CTA Abd/pelvis: apparent thrombus or embolus in prox SMA (70-90%); b. 05/2017 catheter directed tPA, mechanical thrombectomy,  and stenting of the SMA.   Essential hypertension with goal blood pressure less than 140/90 01/19/2016   Gastroesophageal reflux disease 01/19/2016   GERD (gastroesophageal reflux disease)    H/O colectomy    History of 2019 novel coronavirus disease (COVID-19) 11/14/2019   History of kidney stones    Hyperlipidemia    Hypertension    Morbid obesity with BMI of 40.0-44.9, adult (HCC)    NSTEMI (non-ST elevated myocardial infarction) (HCC) 06/27/2021   a.) high sensitivity troponins trended: 893 --> 1587 --> 1748 --> 868 --> 735 ng/L   Occlusive mesenteric ischemia (HCC) 06/19/2017   Osteoarthritis    PAD (peripheral artery disease) (HCC)    PAF (paroxysmal atrial fibrillation) (HCC)    a.) CHA2DS2-VASc = 6 (sex, CHF, HTN, DVT x2, aortic plaque). b.) rate/rhythm maintained on oral metoprolol tartrate; chronically anticoagulated with standard dose apixaban.   Palpitations    Peripheral edema    Personal history of other malignant neoplasm of skin 06/01/2016   Pneumonia    Pre-diabetes    Primary osteoarthritis of both knees 06/13/2017   SBO (small bowel obstruction) (HCC) 08/06/2017   Skin cancer of nose    Superior mesenteric artery thrombosis (HCC)    Vitamin D deficiency     Medications:  Eliquis 5 mg BID  Assessment: 62 year old female admitted with STEMI now s/p cath. PMH includes PAD s/p right AKA, CAD, HFpEF, HTN, HLD, atrial fibrillation on Eliquis  prior to admission. Pharmacy has been consulted to start heparin 2 hours after radial band removal.  4/26 @ 0400:  aPTT = 37,  HL = > 1.10 rate inc to 1050 u/hr 4/26 1149 aPTT=56  subtherapeutic,  1050>> 4/26  2012 aPTT 83 4/27  0159:  aPTT = 76,   HL = > 1.10   Goal of Therapy:  HL :  0.3 - 0.7 aPTT 66-85 seconds  Monitor platelets by anticoagulation protocol: Yes   Plan:  4/27 @ 0159: aPTT = 76, HL = > 1.10  - aPTT therapeutic X 2 but HL still elevated from Eliquis PTA - Will continue pt on current rate and recheck aPTT and HL on 4/28 with AM labs.   planning for AKA 02/21/23 and maintain on heparin drip until then Use aPTT to guide dosing until correlating with HL CBC daily  Chad Donoghue D, PharmD Clinical Pharmacist   02/17/2023 3:13 AM

## 2023-02-18 DIAGNOSIS — I2510 Atherosclerotic heart disease of native coronary artery without angina pectoris: Secondary | ICD-10-CM | POA: Diagnosis not present

## 2023-02-18 DIAGNOSIS — R9431 Abnormal electrocardiogram [ECG] [EKG]: Secondary | ICD-10-CM | POA: Diagnosis not present

## 2023-02-18 DIAGNOSIS — I998 Other disorder of circulatory system: Secondary | ICD-10-CM | POA: Diagnosis not present

## 2023-02-18 DIAGNOSIS — T8740 Infection of amputation stump, unspecified extremity: Secondary | ICD-10-CM | POA: Diagnosis not present

## 2023-02-18 LAB — LIPOPROTEIN A (LPA): Lipoprotein (a): 450.6 nmol/L — ABNORMAL HIGH (ref <75.0)

## 2023-02-18 LAB — HEPARIN LEVEL (UNFRACTIONATED): Heparin Unfractionated: 0.82 IU/mL — ABNORMAL HIGH (ref 0.30–0.70)

## 2023-02-18 LAB — APTT: aPTT: 66 seconds — ABNORMAL HIGH (ref 24–36)

## 2023-02-18 MED ORDER — OXYCODONE HCL 5 MG PO TABS
15.0000 mg | ORAL_TABLET | ORAL | Status: DC | PRN
  Administered 2023-02-18 – 2023-02-24 (×25): 15 mg via ORAL
  Filled 2023-02-18 (×26): qty 3

## 2023-02-18 NOTE — Progress Notes (Signed)
[facility] Cardiology    SUBJECTIVE: Resting quietly in bed denies any chest pain still has some left leg discomfort and swelling no shortness of breath   Vitals:   02/17/23 1604 02/17/23 2156 02/17/23 2314 02/18/23 0949  BP: 115/61 94/60 (!) 114/48 121/71  Pulse: 81 94 86 91  Resp: 18  16 18   Temp: 98.2 F (36.8 C)  98.2 F (36.8 C) (!) 97.3 F (36.3 C)  TempSrc:      SpO2: 95% 94% 97% 93%  Weight:      Height:         Intake/Output Summary (Last 24 hours) at 02/18/2023 1304 Last data filed at 02/18/2023 1038 Gross per 24 hour  Intake 720 ml  Output 2640 ml  Net -1920 ml      PHYSICAL EXAM  General: Well developed, well nourished, in no acute distress HEENT:  Normocephalic and atramatic Neck:  No JVD.  Lungs: Clear bilaterally to auscultation and percussion. Heart: HRRR . Normal S1 and S2 without gallops or murmurs.  Abdomen: Bowel sounds are positive, abdomen soft and non-tender  Msk:  Back normal, normal gait. Normal strength and tone for age. Extremities: No clubbing, cyanosis or edema.   Neuro: Alert and oriented X 3. Psych:  Good affect, responds appropriately   LABS: Basic Metabolic Panel: Recent Labs    02/16/23 1141 02/17/23 0159  NA 137 136  K 4.1 4.1  CL 102 103  CO2 28 24  GLUCOSE 126* 103*  BUN 10 9  CREATININE 0.50 0.57  CALCIUM 8.1* 8.5*   Liver Function Tests: No results for input(s): "AST", "ALT", "ALKPHOS", "BILITOT", "PROT", "ALBUMIN" in the last 72 hours. No results for input(s): "LIPASE", "AMYLASE" in the last 72 hours. CBC: Recent Labs    02/16/23 1141 02/17/23 0159  WBC 6.6 5.2  NEUTROABS 4.3  --   HGB 9.5* 10.2*  HCT 30.5* 33.9*  MCV 90.5 91.6  PLT 220 215   Cardiac Enzymes: No results for input(s): "CKTOTAL", "CKMB", "CKMBINDEX", "TROPONINI" in the last 72 hours. BNP: Invalid input(s): "POCBNP" D-Dimer: No results for input(s): "DDIMER" in the last 72 hours. Hemoglobin A1C: Recent Labs    02/16/23 1141  HGBA1C 5.7*    Fasting Lipid Panel: Recent Labs    02/16/23 1141  CHOL 233*  HDL 34*  LDLCALC 121*  TRIG 390*  CHOLHDL 6.9   Thyroid Function Tests: No results for input(s): "TSH", "T4TOTAL", "T3FREE", "THYROIDAB" in the last 72 hours.  Invalid input(s): "FREET3" Anemia Panel: No results for input(s): "VITAMINB12", "FOLATE", "FERRITIN", "TIBC", "IRON", "RETICCTPCT" in the last 72 hours.  No results found.   Echo history of preserved left ventricular function EF of 60%  TELEMETRY: Normal sinus rhythm rate of 80 nonspecific CT changes:  ASSESSMENT AND PLAN:  Principal Problem:   Acute lower limb ischemia Active Problems:   Obesity, Class III, BMI 40-49.9 (morbid obesity) (HCC)   Paroxysmal atrial fibrillation (HCC)   COPD (chronic obstructive pulmonary disease) (HCC)   Chronic pain   Chronic diastolic CHF (congestive heart failure) (HCC)   Coronary artery disease involving native coronary artery of native heart   Atherosclerosis of artery of extremity with rest pain (HCC)   Amputation stump infection (HCC)   ST elevation    Plan Critical limb ischemia recommend preop clearance for amputation with vascular Paroxysmal atrial fibrillation anticoagulation will hold prior to amputation maintain heparin while in hospital for anticoagulation management Chronic diastolic congestive heart failure compensated no worsening shortness of breath continue  conservative therapy Cardiac spasm status post cardiac cath with spasm of the right coronary artery recommend Imdur calcium blocker in place of beta-blocker Continue pain control with conservative management Continue to follow conservatively from a cardiac standpoint    Alwyn Pea, MD 02/18/2023 1:04 PM

## 2023-02-18 NOTE — Progress Notes (Signed)
Progress Note   Patient: Carla [name]:[id number] DOB: [birth year] DOA: 02/15/2023     3 DOS: the patient was seen and examined on 02/18/2023   Brief hospital course: 62 y.o. female with medical history significant of severe PAD s/p right AKA, CAD, HFpEF, hypertension, hyperlipidemia, paroxysmal atrial fibrillation on Eliquis, SMA thrombosis, who presents to the ED due to leg pain.   Carla [name] states she has been experiencing left lower extremity pain for the last 1 week, however it suddenly worsened this morning and that is why she called EMS.  She endorses chronic numbness and tingling that is unchanged.  In addition, she has been experiencing intermittent chest pain that occurs at rest and self resolves.  She denies any shortness of breath or palpitations.  She denies any nausea, vomiting, diarrhea.  She endorses some pain overlying her umbilical hernia but no other abdominal pain.  Cardiac catheterization showed a proximal RCA lesion 30% stenosed and a first diagonal lesion 50% stenosis, EF 65% by visual estimate.  ST elevation believed to be secondary to coronary spasm and Imdur was increased.  4/26.  Patient upset about vascular team talking about amputation of her left leg.  Patient still having pain left leg. 4/27.  Patient still very upset but agreeable to amputation.  Still having pain in left leg. 4/28.  Patient stating she wants to go up on her oral pain medications because she is in severe pain with her left leg.  She was on Norco 07/25/2024 and I will switch to oxycodone 15 mg every 4 hours as needed for pain.  Assessment and Plan: * Acute lower limb ischemia Extensive history of PAD, with prior right AKA (June 2023) with recent stump revision (February 2024), and left femoral bypass mechanical thrombectomy (March 2024). Patient is presenting with 1 week of left lower extremity. - Oral (increased to oxycodone 15 mg every 4 hours as needed) and IV pain medications -Vascular team set  up for amputation for May 1.  ST elevation Patient presented with chest pain has been intermittent, in addition to ST elevation in the lateral leads.  Left heart cath demonstrated RCA spasm, resolved with nitroglycerin.  No stenting required. - Imdur increased to 90 mg daily - Continue Cardizem if blood pressure allows.  Coronary artery disease involving native coronary artery of native heart - Nonobstructive CAD, continue aspirin, Imdur  Amputation stump infection (HCC) Stump wound covered by wound VAC. - Continue home Omadacyclin - Wound VAC per vascular surgery   Paroxysmal atrial fibrillation (HCC) - Cardizem if blood pressure allows. - Anticoagulation with heparin  Chronic diastolic CHF (congestive heart failure) (HCC) Patient appears euvolemic at this time.   COPD (chronic obstructive pulmonary disease) (HCC) Continue home bronchodilators with DuoNebs as needed.  Chronic pain Continue home hydrocodone-acetaminophen with Dilaudid for breakthrough pain given acute ischemia  Obesity, Class III, BMI 40-49.9 (morbid obesity) (HCC) BMI 41.49 with current height and weight.        Subjective: Patient states that she is in a lot of pain and wants to go up on her oral pain medication.  Already on 07/25/2024 of Norco.  Will increase to oxycodone 15 mg every 4 hours as needed.  Admitted with left lower extremity ischemia.  Physical Exam: Vitals:   02/17/23 1604 02/17/23 2156 02/17/23 2314 02/18/23 0949  BP: 115/61 94/60 (!) 114/48 121/71  Pulse: 81 94 86 91  Resp: 18  16 18   Temp: 98.2 F (36.8 C)  98.2 F (36.8 C) (!)  97.3 F (36.3 C)  TempSrc:      SpO2: 95% 94% 97% 93%  Weight:      Height:       Physical Exam HENT:     Head: Normocephalic.     Mouth/Throat:     Pharynx: No oropharyngeal exudate.  Eyes:     General: Lids are normal.     Conjunctiva/sclera: Conjunctivae normal.  Cardiovascular:     Rate and Rhythm: Normal rate and regular rhythm.     Heart  sounds: Normal heart sounds, S1 normal and S2 normal.  Pulmonary:     Breath sounds: No decreased breath sounds, wheezing, rhonchi or rales.  Abdominal:     Palpations: Abdomen is soft.     Tenderness: There is no abdominal tenderness.  Musculoskeletal:     Left lower leg: Swelling present.     Right Lower Extremity: Right leg is amputated above knee.  Skin:    General: Skin is warm.     Comments: Small wound VAC over right stump.  Neurological:     Mental Status: She is alert and oriented to person, place, and time.     Data Reviewed: Last hemoglobin 10.2, last creatinine 0.57  Family Communication: Left message for Carla [name] on the phone  Disposition: Status is: Inpatient Remains inpatient appropriate because: Will have amputation AKA left leg on 5/1.  Planned Discharge Destination: Home with Home Health    Time spent: 28 minutes  Author: Alford Highland, MD 02/18/2023 1:00 PM  For on call review [email or web address].

## 2023-02-18 NOTE — Progress Notes (Signed)
ANTICOAGULATION CONSULT NOTE -   Pharmacy Consult for IV heparin Indication: STEMI  Allergies  Allergen Reactions   Gabapentin     Tremors    Bactrim [Sulfamethoxazole-Trimethoprim] Itching    Patient Measurements: Height: 5\' 2"  (157.5 cm) Weight: 102.9 kg (226 lb 13.7 oz) IBW/kg (Calculated) : 50.1 Heparin Dosing Weight: 73.1 kg  Vital Signs: Temp: 98.2 F (36.8 C) (04/27 2314) BP: 114/48 (04/27 2314) Pulse Rate: 86 (04/27 2314)  Labs: Recent Labs    02/15/23 1136 02/15/23 1427 02/16/23 0400 02/16/23 1141 02/16/23 1149 02/16/23 2012 02/17/23 0158 02/17/23 0159 02/18/23 0542  HGB 10.4*  --   --  9.5*  --   --   --  10.2*  --   HCT 34.2*  --   --  30.5*  --   --   --  33.9*  --   PLT 275  --   --  220  --   --   --  215  --   APTT  --   --  37*  --    < > 83*  --  76* 66*  LABPROT 14.6  --   --   --   --   --   --   --   --   INR 1.2  --   --   --   --   --   --   --   --   HEPARINUNFRC  --   --  >1.10*  --   --   --  >1.10*  --  0.82*  CREATININE 0.57  --   --  0.50  --   --   --  0.57  --   CKTOTAL 27*  --   --   --   --   --   --   --   --   TROPONINIHS 4 9  --   --   --   --   --   --   --    < > = values in this interval not displayed.     Estimated Creatinine Clearance: 80.9 mL/min (by C-G formula based on SCr of 0.57 mg/dL).   Medical History: Past Medical History:  Diagnosis Date   Abdominal aortic atherosclerosis (HCC)    a. 05/2017 CTA abd/pelvis: significant atherosclerotic dzs of the infrarenal abd Ao w/ some mural thrombus. No aneurysm or dissection.   Acute focal ischemia of small intestine (HCC)    Acute right-sided low back pain with right-sided sciatica 06/13/2017   AKI (acute kidney injury) (HCC) 07/29/2017   Anemia    Anginal pain (HCC)    a. 08/2012 Lexiscan MV: EF 54%, non ischemia/infarct.   Anxiety and depression    Baker's cyst of knee, right    a. 07/2016 U/S: 4.1 x 1.4 x 2.9 cystic structure in R poplitetal fossa.   Bell's  palsy    CAD (coronary artery disease)    CHF (congestive heart failure) (HCC)    a.) TTE 06/20/2017: EF 60-65%, no rwma, G1DD, no source of cardiac emboli.   Chronic anticoagulation    a.) apixaban   Chronic idiopathic constipation    Chronic mesenteric ischemia (HCC)    Chronic pain    COPD (chronic obstructive pulmonary disease) (HCC)    Dyspnea    Embolus of superior mesenteric artery (HCC)    a. 05/2017 CTA Abd/pelvis: apparent thrombus or embolus in prox SMA (70-90%); b. 05/2017 catheter directed tPA, mechanical thrombectomy, and stenting  of the SMA.   Essential hypertension with goal blood pressure less than 140/90 01/19/2016   Gastroesophageal reflux disease 01/19/2016   GERD (gastroesophageal reflux disease)    H/O colectomy    History of 2019 novel coronavirus disease (COVID-19) 11/14/2019   History of kidney stones    Hyperlipidemia    Hypertension    Morbid obesity with BMI of 40.0-44.9, adult (HCC)    NSTEMI (non-ST elevated myocardial infarction) (HCC) 06/27/2021   a.) high sensitivity troponins trended: 893 --> 1587 --> 1748 --> 868 --> 735 ng/L   Occlusive mesenteric ischemia (HCC) 06/19/2017   Osteoarthritis    PAD (peripheral artery disease) (HCC)    PAF (paroxysmal atrial fibrillation) (HCC)    a.) CHA2DS2-VASc = 6 (sex, CHF, HTN, DVT x2, aortic plaque). b.) rate/rhythm maintained on oral metoprolol tartrate; chronically anticoagulated with standard dose apixaban.   Palpitations    Peripheral edema    Personal history of other malignant neoplasm of skin 06/01/2016   Pneumonia    Pre-diabetes    Primary osteoarthritis of both knees 06/13/2017   SBO (small bowel obstruction) (HCC) 08/06/2017   Skin cancer of nose    Superior mesenteric artery thrombosis (HCC)    Vitamin D deficiency     Medications:  Eliquis 5 mg BID  Assessment: 62 year old female admitted with STEMI now s/p cath. PMH includes PAD s/p right AKA, CAD, HFpEF, HTN, HLD, atrial fibrillation on  Eliquis prior to admission. Pharmacy has been consulted to start heparin 2 hours after radial band removal.  4/26 @ 0400:  aPTT = 37,  HL = > 1.10 rate inc to 1050 u/hr 4/26 1149 aPTT=56  subtherapeutic,  1050>> 4/26  2012 aPTT 83 4/27  0159:  aPTT = 76,   HL = > 1.10  4/28  0542:  aPTT = 66,   HL = 0.82  Goal of Therapy:  HL :  0.3 - 0.7 aPTT 66-85 seconds  Monitor platelets by anticoagulation protocol: Yes   Plan:  4/28 @ 0542:  aPTT = 66, HL = 0.82 - aPTT therapeutic X 3,  HL still elevated from Eliquis PTA - Will continue pt on current rate and recheck HL and aPTT on 4/29 with AM labs.   planning for AKA 02/21/23 and maintain on heparin drip until then Use aPTT to guide dosing until correlating with HL CBC daily  Marytza Grandpre D, PharmD Clinical Pharmacist   02/18/2023 7:16 AM

## 2023-02-18 NOTE — Plan of Care (Signed)
  Problem: Activity: Goal: Ability to return to baseline activity level will improve Outcome: Progressing   Problem: Clinical Measurements: Goal: Ability to maintain clinical measurements within normal limits will improve Outcome: Progressing Goal: Will remain free from infection Outcome: Progressing Goal: Diagnostic test results will improve Outcome: Progressing Goal: Cardiovascular complication will be avoided Outcome: Progressing   Problem: Activity: Goal: Risk for activity intolerance will decrease Outcome: Progressing   Problem: Coping: Goal: Level of anxiety will decrease Outcome: Progressing   Problem: Pain Managment: Goal: General experience of comfort will improve Outcome: Progressing   Problem: Safety: Goal: Ability to remain free from injury will improve Outcome: Progressing   Problem: Skin Integrity: Goal: Risk for impaired skin integrity will decrease Outcome: Progressing

## 2023-02-19 ENCOUNTER — Encounter (INDEPENDENT_AMBULATORY_CARE_PROVIDER_SITE_OTHER): Payer: Self-pay | Admitting: Nurse Practitioner

## 2023-02-19 DIAGNOSIS — I998 Other disorder of circulatory system: Secondary | ICD-10-CM | POA: Diagnosis not present

## 2023-02-19 DIAGNOSIS — R9431 Abnormal electrocardiogram [ECG] [EKG]: Secondary | ICD-10-CM | POA: Diagnosis not present

## 2023-02-19 DIAGNOSIS — T8740 Infection of amputation stump, unspecified extremity: Secondary | ICD-10-CM | POA: Diagnosis not present

## 2023-02-19 DIAGNOSIS — I2510 Atherosclerotic heart disease of native coronary artery without angina pectoris: Secondary | ICD-10-CM | POA: Diagnosis not present

## 2023-02-19 LAB — BASIC METABOLIC PANEL
Anion gap: 11 (ref 5–15)
BUN: 9 mg/dL (ref 8–23)
CO2: 27 mmol/L (ref 22–32)
Calcium: 9.2 mg/dL (ref 8.9–10.3)
Chloride: 101 mmol/L (ref 98–111)
Creatinine, Ser: 0.68 mg/dL (ref 0.44–1.00)
GFR, Estimated: >60 mL/min (ref >60)
Glucose, Bld: 151 mg/dL — ABNORMAL HIGH (ref 70–99)
Potassium: 3.8 mmol/L (ref 3.5–5.1)
Sodium: 139 mmol/L (ref 135–145)

## 2023-02-19 LAB — CBC
HCT: 34.1 % — ABNORMAL LOW (ref 36.0–46.0)
Hemoglobin: 10.2 g/dL — ABNORMAL LOW (ref 12.0–15.0)
MCH: 27.5 pg (ref 26.0–34.0)
MCHC: 29.9 g/dL — ABNORMAL LOW (ref 30.0–36.0)
MCV: 91.9 fL (ref 80.0–100.0)
Platelets: 250 10*3/uL (ref 150–400)
RBC: 3.71 MIL/uL — ABNORMAL LOW (ref 3.87–5.11)
RDW: 16.8 % — ABNORMAL HIGH (ref 11.5–15.5)
WBC: 6.4 10*3/uL (ref 4.0–10.5)
nRBC: 0 % (ref 0.0–0.2)

## 2023-02-19 LAB — APTT
aPTT: 58 seconds — ABNORMAL HIGH (ref 24–36)
aPTT: 59 seconds — ABNORMAL HIGH (ref 24–36)
aPTT: 67 seconds — ABNORMAL HIGH (ref 24–36)

## 2023-02-19 LAB — HEPARIN LEVEL (UNFRACTIONATED): Heparin Unfractionated: 0.71 IU/mL — ABNORMAL HIGH (ref 0.30–0.70)

## 2023-02-19 MED ORDER — HEPARIN BOLUS VIA INFUSION
1100.0000 [IU] | Freq: Once | INTRAVENOUS | Status: AC
  Administered 2023-02-19: 1100 [IU] via INTRAVENOUS
  Filled 2023-02-19: qty 1100

## 2023-02-19 MED ORDER — LIDOCAINE 5 % EX PTCH
1.0000 | MEDICATED_PATCH | CUTANEOUS | Status: DC
  Administered 2023-02-19 – 2023-02-24 (×6): 1 via TRANSDERMAL
  Filled 2023-02-19 (×6): qty 1

## 2023-02-19 NOTE — Progress Notes (Signed)
ANTICOAGULATION CONSULT NOTE -   Pharmacy Consult for IV heparin Indication: STEMI  Allergies  Allergen Reactions   Gabapentin     Tremors    Bactrim [Sulfamethoxazole-Trimethoprim] Itching    Patient Measurements: Height: 5\' 2"  (157.5 cm) Weight: 102.9 kg (226 lb 13.7 oz) IBW/kg (Calculated) : 50.1 Heparin Dosing Weight: 73.1 kg  Vital Signs:    Labs: Recent Labs    02/16/23 1141 02/16/23 1149 02/17/23 0158 02/17/23 0159 02/18/23 0542 02/19/23 0421  HGB 9.5*  --   --  10.2*  --  10.2*  HCT 30.5*  --   --  33.9*  --  34.1*  PLT 220  --   --  215  --  250  APTT  --    < >  --  76* 66* 58*  HEPARINUNFRC  --   --  >1.10*  --  0.82* 0.71*  CREATININE 0.50  --   --  0.57  --  0.68   < > = values in this interval not displayed.     Estimated Creatinine Clearance: 80.9 mL/min (by C-G formula based on SCr of 0.68 mg/dL).   Medical History: Past Medical History:  Diagnosis Date   Abdominal aortic atherosclerosis (HCC)    a. 05/2017 CTA abd/pelvis: significant atherosclerotic dzs of the infrarenal abd Ao w/ some mural thrombus. No aneurysm or dissection.   Acute focal ischemia of small intestine (HCC)    Acute right-sided low back pain with right-sided sciatica 06/13/2017   AKI (acute kidney injury) (HCC) 07/29/2017   Anemia    Anginal pain (HCC)    a. 08/2012 Lexiscan MV: EF 54%, non ischemia/infarct.   Anxiety and depression    Baker's cyst of knee, right    a. 07/2016 U/S: 4.1 x 1.4 x 2.9 cystic structure in R poplitetal fossa.   Bell's palsy    CAD (coronary artery disease)    CHF (congestive heart failure) (HCC)    a.) TTE 06/20/2017: EF 60-65%, no rwma, G1DD, no source of cardiac emboli.   Chronic anticoagulation    a.) apixaban   Chronic idiopathic constipation    Chronic mesenteric ischemia (HCC)    Chronic pain    COPD (chronic obstructive pulmonary disease) (HCC)    Dyspnea    Embolus of superior mesenteric artery (HCC)    a. 05/2017 CTA Abd/pelvis:  apparent thrombus or embolus in prox SMA (70-90%); b. 05/2017 catheter directed tPA, mechanical thrombectomy, and stenting of the SMA.   Essential hypertension with goal blood pressure less than 140/90 01/19/2016   Gastroesophageal reflux disease 01/19/2016   GERD (gastroesophageal reflux disease)    H/O colectomy    History of 2019 novel coronavirus disease (COVID-19) 11/14/2019   History of kidney stones    Hyperlipidemia    Hypertension    Morbid obesity with BMI of 40.0-44.9, adult (HCC)    NSTEMI (non-ST elevated myocardial infarction) (HCC) 06/27/2021   a.) high sensitivity troponins trended: 893 --> 1587 --> 1748 --> 868 --> 735 ng/L   Occlusive mesenteric ischemia (HCC) 06/19/2017   Osteoarthritis    PAD (peripheral artery disease) (HCC)    PAF (paroxysmal atrial fibrillation) (HCC)    a.) CHA2DS2-VASc = 6 (sex, CHF, HTN, DVT x2, aortic plaque). b.) rate/rhythm maintained on oral metoprolol tartrate; chronically anticoagulated with standard dose apixaban.   Palpitations    Peripheral edema    Personal history of other malignant neoplasm of skin 06/01/2016   Pneumonia    Pre-diabetes  Primary osteoarthritis of both knees 06/13/2017   SBO (small bowel obstruction) (HCC) 08/06/2017   Skin cancer of nose    Superior mesenteric artery thrombosis (HCC)    Vitamin D deficiency     Medications:  Eliquis 5 mg BID  Assessment: 62 year old female admitted with STEMI now s/p cath. PMH includes PAD s/p right AKA, CAD, HFpEF, HTN, HLD, atrial fibrillation on Eliquis prior to admission. Pharmacy has been consulted to start heparin 2 hours after radial band removal.  4/26 @ 0400:  aPTT = 37,  HL = > 1.10 rate inc to 1050 u/hr 4/26 1149 aPTT=56  subtherapeutic,  1050>> 4/26  2012 aPTT 83 4/27  0159:  aPTT = 76,   HL = > 1.10  4/28  0542:  aPTT = 66,   HL = 0.82 4/29  0421:  aPTT = 58,   HL = 0.71  Goal of Therapy:  HL :  0.3 - 0.7 aPTT 66-85 seconds  Monitor platelets by  anticoagulation protocol: Yes   Plan:  4/29 @ 0421:  aPTT = 58,  HL = 0.72 - aPTT slightly subtherapeutic ,  HL still elevated from Eliquis PTA - Will order heparin 1100 units IV X 1 bolus and increase drip rate to 1350 units/hr. - Will recheck aPTT 6 hrs after rate change - Will recheck HL on 4/30 with AM labs  planning for AKA 02/21/23 and maintain on heparin drip until then Use aPTT to guide dosing until correlating with HL CBC daily  Guy Seese D, PharmD Clinical Pharmacist   02/19/2023 5:43 AM

## 2023-02-19 NOTE — Consult Note (Addendum)
WOC Nurse Consult Note: R AKA w/revision and NPWT placement 12/07/2022; has been undergoing NPWT dressing changes at home prior to this admit  Reason for Consult:NPWT  Wound type: full thickness, surgical  Pressure Injury POA: NA Measurement: 2.5 cms x 4.2 cms x 0.2 cms 100% beefy red   Drainage (amount, consistency, odor) minimal serosanguinous  Periwound: intact  Dressing procedure/placement/frequency: Removed old NPWT dressing Cleansed wound with normal saline Filled wound with 1 piece of black foam Sealed NPWT dressing at HG immediately Patient received both PO pain med (2 hours prior) and IV pain medication (approximately 30 minutes prior) but still complained of pain; patient dozing on and off during dressing change Patient tolerated procedure fair   WOC nurse will continue to provide NPWT dressing changes due to the complexity of the dressing change; next change Wednesday 02/21/2023   Thank you,   Priscella Mann MSN, RN-BC, [employer] [phone]

## 2023-02-19 NOTE — Progress Notes (Signed)
Subjective:    Patient ID: Carla [name], female    DOB: 04-15-1961, 62 y.o.   MRN: [id number] Chief Complaint  Patient presents with   Follow-up    2 week follow up    The patient returns today for evaluation of her right above-knee amputation wound site as well as evaluation of her left lower extremity.  She recently underwent revascularization of the left lower extremity.  Previously the leg was colder throughout but now it is warm.  She still continues to note neuropathic pain but no rest pain like she was having previously.  As far as the patient's wound it is decreasing in size and the granulation tissue is doing very well.  The fibrinous exudate that she had previously has decreased drastically.    Review of Systems  Skin:  Positive for wound.  Psychiatric/Behavioral:  The patient is nervous/anxious.   All other systems reviewed and are negative.      Objective:   Physical Exam Vitals reviewed.  HENT:     Head: Normocephalic.  Cardiovascular:     Rate and Rhythm: Normal rate.  Pulmonary:     Effort: Pulmonary effort is normal.  Skin:    General: Skin is warm and dry.  Neurological:     Mental Status: She is alert and oriented to person, place, and time.  Psychiatric:        Mood and Affect: Mood normal. Affect is tearful.        Behavior: Behavior normal.        Thought Content: Thought content normal.          BP (!) 146/77   Pulse 75   Resp 16   Past Medical History:  Diagnosis Date   Abdominal aortic atherosclerosis (HCC)    a. 05/2017 CTA abd/pelvis: significant atherosclerotic dzs of the infrarenal abd Ao w/ some mural thrombus. No aneurysm or dissection.   Acute focal ischemia of small intestine (HCC)    Acute right-sided low back pain with right-sided sciatica 06/13/2017   AKI (acute kidney injury) (HCC) 07/29/2017   Anemia    Anginal pain (HCC)    a. 08/2012 Lexiscan MV: EF 54%, non ischemia/infarct.   Anxiety and depression    Baker's cyst  of knee, right    a. 07/2016 U/S: 4.1 x 1.4 x 2.9 cystic structure in R poplitetal fossa.   Bell's palsy    CAD (coronary artery disease)    CHF (congestive heart failure) (HCC)    a.) TTE 06/20/2017: EF 60-65%, no rwma, G1DD, no source of cardiac emboli.   Chronic anticoagulation    a.) apixaban   Chronic idiopathic constipation    Chronic mesenteric ischemia (HCC)    Chronic pain    COPD (chronic obstructive pulmonary disease) (HCC)    Dyspnea    Embolus of superior mesenteric artery (HCC)    a. 05/2017 CTA Abd/pelvis: apparent thrombus or embolus in prox SMA (70-90%); b. 05/2017 catheter directed tPA, mechanical thrombectomy, and stenting of the SMA.   Essential hypertension with goal blood pressure less than 140/90 01/19/2016   Gastroesophageal reflux disease 01/19/2016   GERD (gastroesophageal reflux disease)    H/O colectomy    History of 2019 novel coronavirus disease (COVID-19) 11/14/2019   History of kidney stones    Hyperlipidemia    Hypertension    Morbid obesity with BMI of 40.0-44.9, adult (HCC)    NSTEMI (non-ST elevated myocardial infarction) (HCC) 06/27/2021   a.) high sensitivity troponins trended:  893 --> 1587 --> 1748 --> 868 --> 735 ng/L   Occlusive mesenteric ischemia (HCC) 06/19/2017   Osteoarthritis    PAD (peripheral artery disease) (HCC)    PAF (paroxysmal atrial fibrillation) (HCC)    a.) CHA2DS2-VASc = 6 (sex, CHF, HTN, DVT x2, aortic plaque). b.) rate/rhythm maintained on oral metoprolol tartrate; chronically anticoagulated with standard dose apixaban.   Palpitations    Peripheral edema    Personal history of other malignant neoplasm of skin 06/01/2016   Pneumonia    Pre-diabetes    Primary osteoarthritis of both knees 06/13/2017   SBO (small bowel obstruction) (HCC) 08/06/2017   Skin cancer of nose    Superior mesenteric artery thrombosis (HCC)    Vitamin D deficiency     Social History   Socioeconomic History   Marital status: Single    Spouse  name: Not on file   Number of children: Not on file   Years of education: Not on file   Highest education level: Not on file  Occupational History   Occupation: Unemployed  Tobacco Use   Smoking status: Former    Packs/day: 1    Types: Cigarettes    Quit date: 2023    Years since quitting: 1.3   Smokeless tobacco: Never  Vaping Use   Vaping Use: Never used  Substance and Sexual Activity   Alcohol use: No   Drug use: No   Sexual activity: Not on file  Other Topics Concern   Not on file  Social History Narrative   Lives in [place] with daughter   Social Determinants of Health   Financial Resource Strain: Not on file  Food Insecurity: No Food Insecurity (01/17/2023)   Hunger Vital Sign    Worried About Running Out of Food in the Last Year: Never true    Ran Out of Food in the Last Year: Never true  Transportation Needs: No Transportation Needs (01/17/2023)   PRAPARE - Administrator, Civil Service (Medical): No    Lack of Transportation (Non-Medical): No  Physical Activity: Not on file  Stress: Not on file  Social Connections: Not on file  Intimate Partner Violence: Not At Risk (01/17/2023)   Humiliation, Afraid, Rape, and Kick questionnaire    Fear of Current or Ex-Partner: No    Emotionally Abused: No    Physically Abused: No    Sexually Abused: No    Past Surgical History:  Procedure Laterality Date   AMPUTATION Right 04/06/2022   Procedure: AMPUTATION ABOVE KNEE;  Surgeon: Annice Needy, MD;  Location: [facility];  Service: Vascular;  Laterality: Right;   AMPUTATION  12/07/2022   Procedure: AMPUTATION ABOVE KNEE REVISION WITH RESECTION OF FEMUR;  Surgeon: Annice Needy, MD;  Location: [facility];  Service: Vascular;;   AMPUTATION TOE Left 12/09/2019   Procedure: AMPUTATION TOE MPJ left;  Surgeon: Rosetta Posner, DPM;  Location: [facility];  Service: Podiatry;  Laterality: Left;   APPENDECTOMY     APPLICATION OF WOUND VAC  12/01/2021   Procedure: APPLICATION  OF WOUND VAC;  Surgeon: Annice Needy, MD;  Location: [facility];  Service: Vascular;;   APPLICATION OF WOUND VAC Right 05/03/2022   Procedure: APPLICATION OF WOUND VAC;  Surgeon: Annice Needy, MD;  Location: [facility];  Service: Vascular;  Laterality: Right;   APPLICATION OF WOUND VAC Left 07/23/2022   Procedure: APPLICATION OF WOUND VAC;  Surgeon: Nada Libman, MD;  Location: [facility];  Service: Vascular;  Laterality: Left;  Prevena   APPLICATION OF WOUND VAC Right 08/28/2022   Procedure: APPLICATION OF WOUND VAC;  Surgeon: Annice Needy, MD;  Location: [facility];  Service: Vascular;  Laterality: Right;   APPLICATION OF WOUND VAC  12/07/2022   Procedure: APPLICATION OF WOUND VAC;  Surgeon: Annice Needy, MD;  Location: [facility];  Service: Vascular;;   AXILLARY-FEMORAL BYPASS GRAFT Left 07/23/2022   Procedure: BYPASS GRAFT AXILLA-BIFEMORAL;  Surgeon: Nada Libman, MD;  Location: [facility];  Service: Vascular;  Laterality: Left;   CHOLECYSTECTOMY     COLON SURGERY     COLONOSCOPY WITH PROPOFOL N/A 03/23/2022   Procedure: COLONOSCOPY WITH PROPOFOL;  Surgeon: Wyline Mood, MD;  Location: [facility];  Service: Gastroenterology;  Laterality: N/A;  rectal bleed   CORONARY/GRAFT ACUTE MI REVASCULARIZATION N/A 02/15/2023   Procedure: Coronary/Graft Acute MI Revascularization;  Surgeon: Marykay Lex, MD;  Location: [facility];  Service: Cardiovascular;  Laterality: N/A;   EMBOLECTOMY OR THROMBECTOMY, WITH OR WITHOUT CATHETER; FEMOROPOPLITEAL, AORTOILIAC ARTERY, BY LEG INCISION N/A 06/21/2020   Left - Decomp Fasciotomy Leg; Ant &/Or Lat Compart Only; Midline - Angiography, Visceral, Selective Or Supraselective (With Or Without Flush Aortogram); Left - Revascularize, Endovasc, Open/Percut, Iliac Artery, Unilat, Initial Vessel; W/Translum Stent, W/Angioplasty; Right - Revascularize, Endovasc, Open/Percut, Iliac Artery, Ea Add`L Ipsilateral; W/Translumin Stent, W/Angioplasty; Location [facility];    ENDARTERECTOMY FEMORAL Left 12/01/2021   Procedure: ENDARTERECTOMY FEMORAL;  Surgeon: Annice Needy, MD;  Location: [facility];  Service: Vascular;  Laterality: Left;   ENDOVASCULAR REPAIR/STENT GRAFT Left 12/01/2021   Procedure: ENDOVASCULAR REPAIR/STENT GRAFT;  Surgeon: Annice Needy, MD;  Location: [facility];  Service: Cardiovascular;  Laterality: Left;   INCISION AND DRAINAGE OF WOUND Right 05/03/2022   Procedure: IRRIGATION AND DEBRIDEMENT RIGHT STUMP;  Surgeon: Annice Needy, MD;  Location: [facility];  Service: Vascular;  Laterality: Right;   IRRIGATION AND DEBRIDEMENT ABSCESS Right 08/28/2022   Procedure: AKA IRRIGATION AND DEBRIDEMENT ABSCESS;  Surgeon: Annice Needy, MD;  Location: [facility];  Service: Vascular;  Laterality: Right;   LAPAROTOMY N/A 08/08/2017   Procedure: EXPLORATORY LAPAROTOMY POSSIBLE BOWEL RESECTION;  Surgeon: Leafy Ro, MD;  Location: [facility];  Service: General;  Laterality: N/A;   LEFT HEART CATH AND CORONARY ANGIOGRAPHY N/A 05/17/2021   Procedure: LEFT HEART CATH AND CORONARY ANGIOGRAPHY;  Surgeon: Alwyn Pea, MD;  Location: [facility];  Service: Cardiovascular;  Laterality: N/A;   LEFT HEART CATH AND CORONARY ANGIOGRAPHY N/A 02/15/2023   Procedure: LEFT HEART CATH AND CORONARY ANGIOGRAPHY;  Surgeon: Marykay Lex, MD;  Location: [facility];  Service: Cardiovascular;  Laterality: N/A;   LOWER EXTREMITY ANGIOGRAPHY Left 11/17/2019   Procedure: LOWER EXTREMITY ANGIOGRAPHY;  Surgeon: Annice Needy, MD;  Location: [facility];  Service: Cardiovascular;  Laterality: Left;   LOWER EXTREMITY ANGIOGRAPHY Right 12/13/2020   Procedure: LOWER EXTREMITY ANGIOGRAPHY;  Surgeon: Annice Needy, MD;  Location: [facility];  Service: Cardiovascular;  Laterality: Right;   LOWER EXTREMITY ANGIOGRAPHY Right 01/10/2021   Procedure: LOWER EXTREMITY ANGIOGRAPHY;  Surgeon: Annice Needy, MD;  Location: [facility];  Service:  Cardiovascular;  Laterality: Right;   LOWER EXTREMITY ANGIOGRAPHY Right 01/24/2021   Procedure: LOWER EXTREMITY ANGIOGRAPHY;  Surgeon: Annice Needy, MD;  Location: [facility];  Service: Cardiovascular;  Laterality: Right;   LOWER EXTREMITY ANGIOGRAPHY Bilateral 06/28/2021   Procedure: Lower Extremity  Angiography;  Surgeon: Annice Needy, MD;  [facility];  Service: Cardiovascular;  Laterality: Bilateral;   LOWER EXTREMITY ANGIOGRAPHY Right 11/28/2021   Procedure: LOWER EXTREMITY ANGIOGRAPHY;  Surgeon: Annice Needy, MD;  Location: [facility];  Service: Cardiovascular;  Laterality: Right;   LOWER EXTREMITY ANGIOGRAPHY Left 11/30/2021   Procedure: Lower Extremity Angiography;  Surgeon: Annice Needy, MD;  Location: [facility];  Service: Cardiovascular;  Laterality: Left;   LOWER EXTREMITY ANGIOGRAPHY Right 02/22/2022   Procedure: Lower Extremity Angiography;  Surgeon: Annice Needy, MD;  Location: [facility];  Service: Cardiovascular;  Laterality: Right;   LOWER EXTREMITY ANGIOGRAPHY Right 03/13/2022   Procedure: Lower Extremity Angiography;  Surgeon: Annice Needy, MD;  Location: [facility];  Service: Cardiovascular;  Laterality: Right;   LOWER EXTREMITY ANGIOGRAPHY Right 03/24/2022   Procedure: Lower Extremity Angiography;  Surgeon: Annice Needy, MD;  Location: [facility];  Service: Cardiovascular;  Laterality: Right;   LOWER EXTREMITY ANGIOGRAPHY Left 01/17/2023   Procedure: Lower Extremity Angiography;  Surgeon: Annice Needy, MD;  Location: [facility];  Service: Cardiovascular;  Laterality: Left;   LOWER EXTREMITY INTERVENTION N/A 11/19/2019   Procedure: LOWER EXTREMITY INTERVENTION;  Surgeon: Annice Needy, MD;  Location: [facility];  Service: Cardiovascular;  Laterality: N/A;   LOWER EXTREMITY INTERVENTION Bilateral 06/29/2021   Procedure: LOWER EXTREMITY INTERVENTION;  Surgeon: Annice Needy, MD;  Location: [facility];  Service: Cardiovascular;  Laterality: Bilateral;   LOWER EXTREMITY INTERVENTION Left 01/18/2023   Procedure: LOWER EXTREMITY INTERVENTION;  Surgeon: Annice Needy, MD;  Location: [facility];  Service: Cardiovascular;  Laterality: Left;   TEE WITHOUT CARDIOVERSION N/A 06/22/2017   Procedure: TRANSESOPHAGEAL ECHOCARDIOGRAM (TEE);  Surgeon: Iran Ouch, MD;  Location: [facility];  Service: Cardiovascular;  Laterality: N/A;   TOTAL KNEE ARTHROPLASTY Right 07/11/2018   Procedure: TOTAL KNEE ARTHROPLASTY;  Surgeon: Christena Flake, MD;  Location: [facility];  Service: Orthopedics;  Laterality: Right;   VAGINAL HYSTERECTOMY     VISCERAL ARTERY INTERVENTION N/A 06/20/2017   Procedure: Visceral Artery Intervention, possible aortic thrombectomy;  Surgeon: Annice Needy, MD;  Location: [facility];  Service: Cardiovascular;  Laterality: N/A;   VISCERAL ARTERY INTERVENTION N/A 01/28/2018   Procedure: VISCERAL ARTERY INTERVENTION;  Surgeon: Annice Needy, MD;  Location: [facility];  Service: Cardiovascular;  Laterality: N/A;    Family History  Problem Relation Age of Onset   Hypertension Mother    Heart disease Mother    Heart attack Mother    Breast cancer Mother    Heart disease Father    Hypertension Father    Breast cancer Paternal Aunt     Allergies  Allergen Reactions   Gabapentin     Tremors    Bactrim [Sulfamethoxazole-Trimethoprim] Itching       Latest Ref Rng & Units 02/17/2023    1:59 AM 02/16/2023   11:41 AM 02/15/2023   11:36 AM  CBC  WBC 4.0 - 10.5 K/uL 5.2  6.6  5.9   Hemoglobin 12.0 - 15.0 g/dL 09.8  9.5  11.9   Hematocrit 36.0 - 46.0 % 33.9  30.5  34.2   Platelets 150 - 400 K/uL 215  220  275       CMP     Component Value Date/Time   NA 136 02/17/2023 0159   NA  140 02/12/2015 1137   K 4.1 02/17/2023 0159   K 2.9 (L) 02/12/2015 1137   CL 103 02/17/2023 0159   CL 107 02/12/2015 1137   CO2 24 02/17/2023 0159   CO2 27  02/12/2015 1137   GLUCOSE 103 (H) 02/17/2023 0159   GLUCOSE 120 (H) 02/12/2015 1137   BUN 9 02/17/2023 0159   BUN 15 02/12/2015 1137   CREATININE 0.57 02/17/2023 0159   CREATININE 0.78 02/12/2015 1137   CALCIUM 8.5 (L) 02/17/2023 0159   CALCIUM 9.0 02/12/2015 1137   PROT 7.0 01/17/2023 1723   PROT 7.0 09/14/2020 1139   PROT 7.1 01/19/2015 1112   ALBUMIN 3.0 (L) 01/17/2023 1723   ALBUMIN 3.9 09/14/2020 1139   ALBUMIN 3.8 01/19/2015 1112   AST 12 (L) 01/17/2023 1723   AST 17 01/19/2015 1112   ALT 11 01/17/2023 1723   ALT 11 (L) 01/19/2015 1112   ALKPHOS 96 01/17/2023 1723   ALKPHOS 84 01/19/2015 1112   BILITOT 0.3 01/17/2023 1723   BILITOT <0.2 09/14/2020 1139   BILITOT 0.5 01/19/2015 1112   GFRNONAA >60 02/17/2023 0159   GFRNONAA >60 02/12/2015 1137   GFRAA >60 11/23/2019 1330   GFRAA >60 02/12/2015 1137     No results found.     Assessment & Plan:   1. S/P AKA (above knee amputation) unilateral, right (HCC) The patient's wound is granulating very well.  The size is decreased and slough has improved dramatically.  We will continue with the wound vacuum.  I am hopeful that within the next several weeks he may be able to move forward with a topical dressing as the patient is groomed.  Tiresome wearing the wound vacuum.  2. Critical limb ischemia of left lower extremity (HCC) Today the patient's left lower extremity is warm.  Will have her return with noninvasive studies at her follow-up.   No current facility-administered medications on file prior to visit.   Current Outpatient Medications on File Prior to Visit  Medication Sig Dispense Refill   alum & mag hydroxide-simeth (MAALOX/MYLANTA) 200-200-20 MG/5ML suspension Take 15-30 mLs by mouth every 2 (two) hours as needed for indigestion. (Patient not taking: Reported on 02/15/2023) 355 mL 0   apixaban (ELIQUIS) 5 MG TABS tablet Take 1 tablet (5 mg total) by mouth 2 (two) times daily. 60 tablet 11   aspirin EC 81 MG tablet  Take 81 mg by mouth daily. Swallow whole.     atorvastatin (LIPITOR) 80 MG tablet Take 80 mg by mouth at bedtime. (Patient not taking: Reported on 02/15/2023)     bisacodyl (DULCOLAX) 5 MG EC tablet Take 1 tablet (5 mg total) by mouth daily as needed for moderate constipation. (Patient not taking: Reported on 02/15/2023) 30 tablet 0   COMBIVENT RESPIMAT 20-100 MCG/ACT AERS respimat Inhale 1 puff into the lungs every 6 (six) hours as needed.     esomeprazole (NEXIUM) 20 MG capsule Take 20 mg by mouth daily.     feeding supplement (ENSURE ENLIVE / ENSURE PLUS) LIQD Take 237 mLs by mouth 2 (two) times daily between meals. 237 mL 12   ferrous sulfate 325 (65 FE) MG tablet Take 325 mg by mouth daily.     fluconazole (DIFLUCAN) 150 MG tablet Take 150 mg by mouth every 3 (three) days. (Patient not taking: Reported on 02/15/2023)     HYDROcodone-acetaminophen (NORCO) 10-325 MG tablet Take 1 tablet by mouth every 4 (four) hours as needed.     isosorbide mononitrate (IMDUR) 60  MG 24 hr tablet Take 60 mg by mouth daily. (Patient not taking: Reported on 02/15/2023)     lidocaine (LIDODERM) 5 % Place 1 patch onto the skin daily. Remove & Discard patch within 12 hours or as directed by MD 30 patch 0   metroNIDAZOLE (FLAGYL) 500 MG tablet Take 1 tablet (500 mg total) by mouth 2 (two) times daily. (Patient not taking: Reported on 02/15/2023) 28 tablet 0   Omadacycline Tosylate (NUZYRA) 150 MG TABS Take 2 tablets (300 mg total) by mouth daily. 28 tablet 0   oxyCODONE (ROXICODONE) 5 MG immediate release tablet Take 1-2 tablets (5-10 mg total) by mouth every 4 (four) hours as needed for severe pain. (Patient not taking: Reported on 02/15/2023) 30 tablet 0   oxyCODONE-acetaminophen (PERCOCET/ROXICET) 5-325 MG tablet Take 1-2 tablets by mouth every 4 (four) hours as needed for severe pain. (Patient not taking: Reported on 02/15/2023) 24 tablet 0   polyethylene glycol (MIRALAX / GLYCOLAX) 17 g packet Take 17 g by mouth daily.  (Patient not taking: Reported on 02/15/2023) 14 each 0   pregabalin (LYRICA) 200 MG capsule Take 1 capsule (200 mg total) by mouth in the morning, at noon, and at bedtime. 90 capsule 3   citalopram (CELEXA) 40 MG tablet Take 1 tablet (40 mg total) by mouth daily. 30 tablet 2    There are no Patient Instructions on file for this visit. No follow-ups on file.   Georgiana Spinner, NP

## 2023-02-19 NOTE — Progress Notes (Signed)
[name] C[facility] CONSULT NOTE       Patient ID: Carla SEEVERS MRN: [id number] DOB/AGE: [birth year] 63 y.o.  Admit date: 02/15/2023 Referring Physician Dr. Alford Highland Primary Physician  Primary Cardiologist Dr. Juliann Pares Reason for Consultation medical optimization prior to leg amputation / code STEMI   HPI: Carla [name] is a 62yoF with a PMH of HFpEF, CAD (Non-obstructive by cath 04/2021), severe PAD s/p right AKA and left axillofemoral bypass, paroxysmal A-fib, COPD, chronic pain, morbid obesity who presented to [facility] ED via EMS on 02/15/2023 with acute on chronic left leg and foot pain with intermittent self resolving chest discomfort a few days prior.  Initial EKG showed subtle inferior ST elevations without reciprocal changes, and began having 7/10 chest pain during evaluation with the STEMI MD in the emergency department.  She was brought emergently to the Cath Lab where LHC revealed significant proximal RCA spasm exacerbated by the catheter placement which resolved the 90% lesion to 30% after IC nitroglycerin.  [facility] cardiology taking over care for further medical optimization prior to left leg amputation with vascular surgery.  Interval History:  -No acute events -Continues to have significant rest pain of her left leg, tearful, and requesting pain medications -No chest pain, shortness of breath, heart racing or palpitations.   Review of systems complete and found to be negative unless listed above   Past Medical History:  Diagnosis Date   Abdominal aortic atherosclerosis (HCC)    a. 05/2017 CTA abd/pelvis: significant atherosclerotic dzs of the infrarenal abd Ao w/ some mural thrombus. No aneurysm or dissection.   Acute focal ischemia of small intestine (HCC)    Acute right-sided low back pain with right-sided sciatica 06/13/2017   AKI (acute kidney injury) (HCC) 07/29/2017   Anemia    Anginal pain (HCC)    a. 08/2012 Lexiscan MV: EF 54%, non ischemia/infarct.    Anxiety and depression    Baker's cyst of knee, right    a. 07/2016 U/S: 4.1 x 1.4 x 2.9 cystic structure in R poplitetal fossa.   Bell's palsy    CAD (coronary artery disease)    CHF (congestive heart failure) (HCC)    a.) TTE 06/20/2017: EF 60-65%, no rwma, G1DD, no source of cardiac emboli.   Chronic anticoagulation    a.) apixaban   Chronic idiopathic constipation    Chronic mesenteric ischemia (HCC)    Chronic pain    COPD (chronic obstructive pulmonary disease) (HCC)    Dyspnea    Embolus of superior mesenteric artery (HCC)    a. 05/2017 CTA Abd/pelvis: apparent thrombus or embolus in prox SMA (70-90%); b. 05/2017 catheter directed tPA, mechanical thrombectomy, and stenting of the SMA.   Essential hypertension with goal blood pressure less than 140/90 01/19/2016   Gastroesophageal reflux disease 01/19/2016   GERD (gastroesophageal reflux disease)    H/O colectomy    History of 2019 novel coronavirus disease (COVID-19) 11/14/2019   History of kidney stones    Hyperlipidemia    Hypertension    Morbid obesity with BMI of 40.0-44.9, adult (HCC)    NSTEMI (non-ST elevated myocardial infarction) (HCC) 06/27/2021   a.) high sensitivity troponins trended: 893 --> 1587 --> 1748 --> 868 --> 735 ng/L   Occlusive mesenteric ischemia (HCC) 06/19/2017   Osteoarthritis    PAD (peripheral artery disease) (HCC)    PAF (paroxysmal atrial fibrillation) (HCC)    a.) CHA2DS2-VASc = 6 (sex, CHF, HTN, DVT x2, aortic plaque). b.) rate/rhythm maintained on oral metoprolol  tartrate; chronically anticoagulated with standard dose apixaban.   Palpitations    Peripheral edema    Personal history of other malignant neoplasm of skin 06/01/2016   Pneumonia    Pre-diabetes    Primary osteoarthritis of both knees 06/13/2017   SBO (small bowel obstruction) (HCC) 08/06/2017   Skin cancer of nose    Superior mesenteric artery thrombosis (HCC)    Vitamin D deficiency     Past Surgical History:  Procedure  Laterality Date   AMPUTATION Right 04/06/2022   Procedure: AMPUTATION ABOVE KNEE;  Surgeon: Annice Needy, MD;  Location: [facility] ORS;  Service: Vascular;  Laterality: Right;   AMPUTATION  12/07/2022   Procedure: AMPUTATION ABOVE KNEE REVISION WITH RESECTION OF FEMUR;  Surgeon: Annice Needy, MD;  Location: [facility];  Service: Vascular;;   AMPUTATION TOE Left 12/09/2019   Procedure: AMPUTATION TOE MPJ left;  Surgeon: Rosetta Posner, DPM;  Location: [facility];  Service: Podiatry;  Laterality: Left;   APPENDECTOMY     APPLICATION OF WOUND VAC  12/01/2021   Procedure: APPLICATION OF WOUND VAC;  Surgeon: Annice Needy, MD;  Location: [facility];  Service: Vascular;;   APPLICATION OF WOUND VAC Right 05/03/2022   Procedure: APPLICATION OF WOUND VAC;  Surgeon: Annice Needy, MD;  Location: [facility];  Service: Vascular;  Laterality: Right;   APPLICATION OF WOUND VAC Left 07/23/2022   Procedure: APPLICATION OF WOUND VAC;  Surgeon: Nada Libman, MD;  Location: [facility];  Service: Vascular;  Laterality: Left;  Prevena   APPLICATION OF WOUND VAC Right 08/28/2022   Procedure: APPLICATION OF WOUND VAC;  Surgeon: Annice Needy, MD;  Location: [facility];  Service: Vascular;  Laterality: Right;   APPLICATION OF WOUND VAC  12/07/2022   Procedure: APPLICATION OF WOUND VAC;  Surgeon: Annice Needy, MD;  Location: [facility];  Service: Vascular;;   AXILLARY-FEMORAL BYPASS GRAFT Left 07/23/2022   Procedure: BYPASS GRAFT AXILLA-BIFEMORAL;  Surgeon: Nada Libman, MD;  Location: [facility];  Service: Vascular;  Laterality: Left;   CHOLECYSTECTOMY     COLON SURGERY     COLONOSCOPY WITH PROPOFOL N/A 03/23/2022   Procedure: COLONOSCOPY WITH PROPOFOL;  Surgeon: Wyline Mood, MD;  Location: [facility] ENDOSCOPY;  Service: Gastroenterology;  Laterality: N/A;  rectal bleed   CORONARY/GRAFT ACUTE MI REVASCULARIZATION N/A 02/15/2023   Procedure: Coronary/Graft Acute MI Revascularization;  Surgeon: Marykay Lex, MD;  Location: [facility] INV[facility]IVE [facility];   Service: Cardiovascular;  Laterality: N/A;   EMBOLECTOMY OR THROMBECTOMY, WITH OR WITHOUT CATHETER; FEMOROPOPLITEAL, AORTOILIAC ARTERY, BY LEG INCISION N/A 06/21/2020   Left - Decomp Fasciotomy Leg; Ant &/Or Lat Compart Only; Midline - Angiography, Visceral, Selective Or Supraselective (With Or Without Flush Aortogram); Left - Revascularize, Endovasc, Open/Percut, Iliac Artery, Unilat, Initial Vessel; W/Translum Stent, W/Angioplasty; Right - Revascularize, Endovasc, Open/Percut, Iliac Artery, Ea Add`L Ipsilateral; W/Translumin Stent, W/Angioplasty; Location [facility];   ENDARTERECTOMY FEMORAL Left 12/01/2021   Procedure: ENDARTERECTOMY FEMORAL;  Surgeon: Annice Needy, MD;  Location: [facility];  Service: Vascular;  Laterality: Left;   ENDOVASCULAR REPAIR/STENT GRAFT Left 12/01/2021   Procedure: ENDOVASCULAR REPAIR/STENT GRAFT;  Surgeon: Annice Needy, MD;  Location: [facility];  Service: Cardiovascular;  Laterality: Left;   INCISION AND DRAINAGE OF WOUND Right 05/03/2022   Procedure: IRRIGATION AND DEBRIDEMENT RIGHT STUMP;  Surgeon: Annice Needy, MD;  Location: [facility];  Service: Vascular;  Laterality: Right;   IRRIGATION AND DEBRIDEMENT ABSCESS Right 08/28/2022   Procedure: AKA  IRRIGATION AND DEBRIDEMENT ABSCESS;  Surgeon: Annice Needy, MD;  Location: [facility];  Service: Vascular;  Laterality: Right;   LAPAROTOMY N/A 08/08/2017   Procedure: EXPLORATORY LAPAROTOMY POSSIBLE BOWEL RESECTION;  Surgeon: Leafy Ro, MD;  Location: [facility];  Service: General;  Laterality: N/A;   LEFT HEART CATH AND CORONARY ANGIOGRAPHY N/A 05/17/2021   Procedure: LEFT HEART CATH AND CORONARY ANGIOGRAPHY;  Surgeon: Alwyn Pea, MD;  Location: [facility];  Service: Cardiovascular;  Laterality: N/A;   LEFT HEART CATH AND CORONARY ANGIOGRAPHY N/A 02/15/2023   Procedure: LEFT HEART CATH AND CORONARY ANGIOGRAPHY;  Surgeon: Marykay Lex, MD;  Location: [facility];  Service: Cardiovascular;   Laterality: N/A;   LOWER EXTREMITY ANGIOGRAPHY Left 11/17/2019   Procedure: LOWER EXTREMITY ANGIOGRAPHY;  Surgeon: Annice Needy, MD;  Location: [facility];  Service: Cardiovascular;  Laterality: Left;   LOWER EXTREMITY ANGIOGRAPHY Right 12/13/2020   Procedure: LOWER EXTREMITY ANGIOGRAPHY;  Surgeon: Annice Needy, MD;  Location: [facility];  Service: Cardiovascular;  Laterality: Right;   LOWER EXTREMITY ANGIOGRAPHY Right 01/10/2021   Procedure: LOWER EXTREMITY ANGIOGRAPHY;  Surgeon: Annice Needy, MD;  Location: [facility];  Service: Cardiovascular;  Laterality: Right;   LOWER EXTREMITY ANGIOGRAPHY Right 01/24/2021   Procedure: LOWER EXTREMITY ANGIOGRAPHY;  Surgeon: Annice Needy, MD;  Location: [facility];  Service: Cardiovascular;  Laterality: Right;   LOWER EXTREMITY ANGIOGRAPHY Bilateral 06/28/2021   Procedure: Lower Extremity Angiography;  Surgeon: Annice Needy, MD;  Location: [facility];  Service: Cardiovascular;  Laterality: Bilateral;   LOWER EXTREMITY ANGIOGRAPHY Right 11/28/2021   Procedure: LOWER EXTREMITY ANGIOGRAPHY;  Surgeon: Annice Needy, MD;  Location: [facility];  Service: Cardiovascular;  Laterality: Right;   LOWER EXTREMITY ANGIOGRAPHY Left 11/30/2021   Procedure: Lower Extremity Angiography;  Surgeon: Annice Needy, MD;  Location: [facility];  Service: Cardiovascular;  Laterality: Left;   LOWER EXTREMITY ANGIOGRAPHY Right 02/22/2022   Procedure: Lower Extremity Angiography;  Surgeon: Annice Needy, MD;  Location: [facility];  Service: Cardiovascular;  Laterality: Right;   LOWER EXTREMITY ANGIOGRAPHY Right 03/13/2022   Procedure: Lower Extremity Angiography;  Surgeon: Annice Needy, MD;  Location: [facility];  Service: Cardiovascular;  Laterality: Right;   LOWER EXTREMITY ANGIOGRAPHY Right 03/24/2022   Procedure: Lower Extremity Angiography;  Surgeon: Annice Needy, MD;  Location: [facility];   Service: Cardiovascular;  Laterality: Right;   LOWER EXTREMITY ANGIOGRAPHY Left 01/17/2023   Procedure: Lower Extremity Angiography;  Surgeon: Annice Needy, MD;  Location: [facility];  Service: Cardiovascular;  Laterality: Left;   LOWER EXTREMITY INTERVENTION N/A 11/19/2019   Procedure: LOWER EXTREMITY INTERVENTION;  Surgeon: Annice Needy, MD;  Location: [facility];  Service: Cardiovascular;  Laterality: N/A;   LOWER EXTREMITY INTERVENTION Bilateral 06/29/2021   Procedure: LOWER EXTREMITY INTERVENTION;  Surgeon: Annice Needy, MD;  Location: [facility];  Service: Cardiovascular;  Laterality: Bilateral;   LOWER EXTREMITY INTERVENTION Left 01/18/2023   Procedure: LOWER EXTREMITY INTERVENTION;  Surgeon: Annice Needy, MD;  Location: [facility];  Service: Cardiovascular;  Laterality: Left;   TEE WITHOUT CARDIOVERSION N/A 06/22/2017   Procedure: TRANSESOPHAGEAL ECHOCARDIOGRAM (TEE);  Surgeon: Iran Ouch, MD;  Location: [facility];  Service: Cardiovascular;  Laterality: N/A;   TOTAL KNEE ARTHROPLASTY Right 07/11/2018   Procedure: TOTAL KNEE  ARTHROPLASTY;  Surgeon: Christena Flake, MD;  Location: [facility];  Service: Orthopedics;  Laterality: Right;   VAGINAL HYSTERECTOMY     VISCERAL ARTERY INTERVENTION N/A 06/20/2017   Procedure: Visceral Artery Intervention, possible aortic thrombectomy;  Surgeon: Annice Needy, MD;  Location: [facility];  Service: Cardiovascular;  Laterality: N/A;   VISCERAL ARTERY INTERVENTION N/A 01/28/2018   Procedure: VISCERAL ARTERY INTERVENTION;  Surgeon: Annice Needy, MD;  Location: [facility];  Service: Cardiovascular;  Laterality: N/A;    Facility-Administered Medications Prior to Admission  Medication Dose Route Frequency Provider Last Rate Last Admin   Vitamin D (Ergocalciferol) (DRISDOL) 1.25 MG (50000 UNIT) capsule 50,000 Units  50,000 Units Oral Once per day on Mon Wed Fri Pace, Brien R, NP       Medications  Prior to Admission  Medication Sig Dispense Refill Last Dose   apixaban (ELIQUIS) 5 MG TABS tablet Take 1 tablet (5 mg total) by mouth 2 (two) times daily. 60 tablet 11 02/15/2023 at 0900   aspirin EC 81 MG tablet Take 81 mg by mouth daily. Swallow whole.   02/15/2023   COMBIVENT RESPIMAT 20-100 MCG/ACT AERS respimat Inhale 1 puff into the lungs every 6 (six) hours as needed.   unknown   diphenoxylate-atropine (LOMOTIL) 2.5-0.025 MG tablet Take 1 tablet by mouth 4 (four) times daily.   02/15/2023   esomeprazole (NEXIUM) 20 MG capsule Take 20 mg by mouth daily.   02/15/2023   ferrous sulfate 325 (65 FE) MG tablet Take 325 mg by mouth daily.   02/15/2023   HYDROcodone-acetaminophen (NORCO) 10-325 MG tablet Take 1 tablet by mouth every 4 (four) hours as needed.   02/15/2023   lidocaine (LIDODERM) 5 % Place 1 patch onto the skin daily. Remove & Discard patch within 12 hours or as directed by MD 30 patch 0 unknown   metoprolol tartrate (LOPRESSOR) 50 MG tablet Take 50 mg by mouth 2 (two) times daily.   02/15/2023   Omadacycline Tosylate (NUZYRA) 150 MG TABS Take 2 tablets (300 mg total) by mouth daily. 28 tablet 0 02/14/2023   pregabalin (LYRICA) 200 MG capsule Take 1 capsule (200 mg total) by mouth in the morning, at noon, and at bedtime. 90 capsule 3 02/15/2023   alum & mag hydroxide-simeth (MAALOX/MYLANTA) 200-200-20 MG/5ML suspension Take 15-30 mLs by mouth every 2 (two) hours as needed for indigestion. (Patient not taking: Reported on 02/15/2023) 355 mL 0 Not Taking   atorvastatin (LIPITOR) 80 MG tablet Take 80 mg by mouth at bedtime. (Patient not taking: Reported on 02/15/2023)   Not Taking   bisacodyl (DULCOLAX) 5 MG EC tablet Take 1 tablet (5 mg total) by mouth daily as needed for moderate constipation. (Patient not taking: Reported on 02/15/2023) 30 tablet 0 Not Taking   citalopram (CELEXA) 40 MG tablet Take 1 tablet (40 mg total) by mouth daily. 30 tablet 2    feeding supplement (ENSURE ENLIVE / ENSURE  PLUS) LIQD Take 237 mLs by mouth 2 (two) times daily between meals. 237 mL 12    fluconazole (DIFLUCAN) 150 MG tablet Take 150 mg by mouth every 3 (three) days. (Patient not taking: Reported on 02/15/2023)   Not Taking   isosorbide mononitrate (IMDUR) 60 MG 24 hr tablet Take 60 mg by mouth daily. (Patient not taking: Reported on 02/15/2023)   Not Taking   metroNIDAZOLE (FLAGYL) 500 MG tablet Take 1 tablet (500 mg total) by mouth 2 (two) times daily. (Patient not  taking: Reported on 02/15/2023) 28 tablet 0 Not Taking   oxyCODONE (ROXICODONE) 5 MG immediate release tablet Take 1-2 tablets (5-10 mg total) by mouth every 4 (four) hours as needed for severe pain. (Patient not taking: Reported on 02/15/2023) 30 tablet 0 Not Taking   oxyCODONE-acetaminophen (PERCOCET/ROXICET) 5-325 MG tablet Take 1-2 tablets by mouth every 4 (four) hours as needed for severe pain. (Patient not taking: Reported on 02/15/2023) 24 tablet 0 Not Taking   polyethylene glycol (MIRALAX / GLYCOLAX) 17 g packet Take 17 g by mouth daily. (Patient not taking: Reported on 02/15/2023) 14 each 0 Not Taking   Social History   Socioeconomic History   Marital status: Single    Spouse name: Not on file   Number of children: Not on file   Years of education: Not on file   Highest education level: Not on file  Occupational History   Occupation: Unemployed  Tobacco Use   Smoking status: Former    Packs/day: 1    Types: Cigarettes    Quit date: 2023    Years since quitting: 1.3   Smokeless tobacco: Never  Vaping Use   Vaping Use: Never used  Substance and Sexual Activity   Alcohol use: No   Drug use: No   Sexual activity: Not on file  Other Topics Concern   Not on file  Social History Narrative   Lives in [place] with daughter   Social Determinants of Health   Financial Resource Strain: Not on file  Food Insecurity: No Food Insecurity (01/17/2023)   Hunger Vital Sign    Worried About Running Out of Food in the Last Year:  Never true    Ran Out of Food in the Last Year: Never true  Transportation Needs: No Transportation Needs (01/17/2023)   PRAPARE - Administrator, Civil Service (Medical): No    Lack of Transportation (Non-Medical): No  Physical Activity: Not on file  Stress: Not on file  Social Connections: Not on file  Intimate Partner Violence: Not At Risk (01/17/2023)   Humiliation, Afraid, Rape, and Kick questionnaire    Fear of Current or Ex-Partner: No    Emotionally Abused: No    Physically Abused: No    Sexually Abused: No    Family History  Problem Relation Age of Onset   Hypertension Mother    Heart disease Mother    Heart attack Mother    Breast cancer Mother    Heart disease Father    Hypertension Father    Breast cancer Paternal Aunt       Intake/Output Summary (Last 24 hours) at 02/19/2023 1014 Last data filed at 02/19/2023 1610 Gross per 24 hour  Intake 840 ml  Output 3700 ml  Net -2860 ml     Vitals:   02/19/23 0758 02/19/23 0837 02/19/23 0903 02/19/23 0948  BP:      Pulse:      Resp: 16  16 16   Temp:  98.5 F (36.9 C)    TempSrc:  Oral    SpO2:      Weight:      Height:        PHYSICAL EXAM General: Chronically ill-appearing obese Caucasian female, laying nearly flat in bed on room air.   HEENT:  Normocephalic and atraumatic. Neck:  No JVD.  Lungs: Normal respiratory effort on room air.  Decreased breath sounds bilaterally Heart: HRRR . Normal S1 and S2 without gallops or murmurs.  Abdomen: Non-distended appearing with  excess adiposity.  Msk: Normal strength and tone for age. Extremities: Left warm to touch - without dopplerable pulses, s/p right AKA with wound VAC in place.  Neuro: alert and oriented Psych: tearful   Labs: Basic Metabolic Panel: Recent Labs    02/17/23 0159 02/19/23 0421  NA 136 139  K 4.1 3.8  CL 103 101  CO2 24 27  GLUCOSE 103* 151*  BUN 9 9  CREATININE 0.57 0.68  CALCIUM 8.5* 9.2    Liver Function Tests: No  results for input(s): "AST", "ALT", "ALKPHOS", "BILITOT", "PROT", "ALBUMIN" in the last 72 hours. No results for input(s): "LIPASE", "AMYLASE" in the last 72 hours. CBC: Recent Labs    02/16/23 1141 02/17/23 0159 02/19/23 0421  WBC 6.6 5.2 6.4  NEUTROABS 4.3  --   --   HGB 9.5* 10.2* 10.2*  HCT 30.5* 33.9* 34.1*  MCV 90.5 91.6 91.9  PLT 220 215 250    Cardiac Enzymes: No results for input(s): "CKTOTAL", "CKMB", "CKMBINDEX", "TROPONINIHS" in the last 72 hours.  BNP: No results for input(s): "BNP" in the last 72 hours. D-Dimer: No results for input(s): "DDIMER" in the last 72 hours. Hemoglobin A1C: Recent Labs    02/16/23 1141  HGBA1C 5.7*   Fasting Lipid Panel: Recent Labs    02/16/23 1141  CHOL 233*  HDL 34*  LDLCALC 121*  TRIG 390*  CHOLHDL 6.9   Thyroid Function Tests: No results for input(s): "TSH", "T4TOTAL", "T3FREE", "THYROIDAB" in the last 72 hours.  Invalid input(s): "FREET3" Anemia Panel: No results for input(s): "VITAMINB12", "FOLATE", "FERRITIN", "TIBC", "IRON", "RETICCTPCT" in the last 72 hours.   Radiology: ECHOCARDIOGRAM COMPLETE  Result Date: 02/16/2023    ECHOCARDIOGRAM REPORT   Patient Name:   KAMARAH BILOTTA Date of Exam: 02/16/2023 Medical Rec #:  [id number]     Height:       62.0 in Accession #:    [id number]    Weight:       214.9 lb Date of Birth:  1960-12-28      BSA:          1.971 m Patient Age:    62 years      BP:           133/74 mmHg Patient Gender: F             HR:           82 bpm. Exam Location:  [facility] Procedure: 2D Echo, Cardiac Doppler and Color Doppler Indications:     CAD, native vessel I25.10  History:         Patient has prior history of Echocardiogram examinations, most                  recent 06/15/2018. CHF; Risk Factors:Hypertension and                  Dyslipidemia.  Sonographer:     Cristela Blue Referring Phys:  [id number] DAVID W HARDING Diagnosing Phys: Julien Nordmann MD  Sonographer Comments: No parasternal window. IMPRESSIONS  1. Left  ventricular ejection fraction, by estimation, is 55 to 60%. The left ventricle has normal function. The left ventricle has no regional wall motion abnormalities. There is mild left ventricular hypertrophy. Left ventricular diastolic parameters are consistent with Grade I diastolic dysfunction (impaired relaxation).  2. Right ventricular systolic function is normal. The right ventricular size is normal.  3. The mitral valve is normal in structure. No evidence of mitral valve regurgitation.  No evidence of mitral stenosis.  4. The aortic valve is normal in structure. There is mild calcification of the aortic valve. Aortic valve regurgitation is not visualized. Aortic valve sclerosis is present, with no evidence of aortic valve stenosis.  5. The inferior vena cava is normal in size with greater than 50% respiratory variability, suggesting right atrial pressure of 3 mmHg. FINDINGS  Left Ventricle: Left ventricular ejection fraction, by estimation, is 55 to 60%. The left ventricle has normal function. The left ventricle has no regional wall motion abnormalities. The left ventricular internal cavity size was normal in size. There is  mild left ventricular hypertrophy. Left ventricular diastolic parameters are consistent with Grade I diastolic dysfunction (impaired relaxation). Right Ventricle: The right ventricular size is normal. No increase in right ventricular wall thickness. Right ventricular systolic function is normal. Left Atrium: Left atrial size was normal in size. Right Atrium: Right atrial size was normal in size. Pericardium: There is no evidence of pericardial effusion. Mitral Valve: The mitral valve is normal in structure. There is mild calcification of the mitral valve leaflet(s). No evidence of mitral valve regurgitation. No evidence of mitral valve stenosis. MV peak gradient, 3.2 mmHg. The mean mitral valve gradient  is 1.0 mmHg. Tricuspid Valve: The tricuspid valve is normal in structure. Tricuspid valve  regurgitation is not demonstrated. No evidence of tricuspid stenosis. Aortic Valve: The aortic valve is normal in structure. There is mild calcification of the aortic valve. Aortic valve regurgitation is not visualized. Aortic valve sclerosis is present, with no evidence of aortic valve stenosis. Aortic valve mean gradient  measures 3.0 mmHg. Aortic valve peak gradient measures 6.1 mmHg. Aortic valve area, by VTI measures 3.02 cm. Pulmonic Valve: The pulmonic valve was normal in structure. Pulmonic valve regurgitation is not visualized. No evidence of pulmonic stenosis. Aorta: The aortic root is normal in size and structure. Venous: The inferior vena cava is normal in size with greater than 50% respiratory variability, suggesting right atrial pressure of 3 mmHg. IAS/Shunts: No atrial level shunt detected by color flow Doppler.  LEFT VENTRICLE PLAX 2D LVIDd:         3.10 cm   Diastology LVIDs:         2.30 cm   LV e' medial:    7.18 cm/s LV PW:         1.20 cm   LV E/e' medial:  7.8 LV IVS:        1.50 cm   LV e' lateral:   11.10 cm/s LVOT diam:     1.90 cm   LV E/e' lateral: 5.1 LV SV:         61 LV SV Index:   31 LVOT Area:     2.84 cm  RIGHT VENTRICLE RV Basal diam:  2.70 cm RV Mid diam:    2.50 cm RV S prime:     13.90 cm/s TAPSE (M-mode): 1.3 cm LEFT ATRIUM           Index       RIGHT ATRIUM          Index LA diam:      2.30 cm 1.17 cm/m  RA Area:     6.87 cm LA Vol (A2C): 17.8 ml 9.03 ml/m  RA Volume:   10.50 ml 5.33 ml/m LA Vol (A4C): 12.4 ml 6.29 ml/m  AORTIC VALVE AV Area (Vmax):    2.69 cm AV Area (Vmean):   2.89 cm AV Area (VTI):  3.02 cm AV Vmax:           123.50 cm/s AV Vmean:          81.200 cm/s AV VTI:            0.202 m AV Peak Grad:      6.1 mmHg AV Mean Grad:      3.0 mmHg LVOT Vmax:         117.00 cm/s LVOT Vmean:        82.700 cm/s LVOT VTI:          0.216 m LVOT/AV VTI ratio: 1.07  AORTA Ao Root diam: 2.40 cm MITRAL VALVE               TRICUSPID VALVE MV Area (PHT): 2.68 cm    TR  Peak grad:   21.3 mmHg MV Area VTI:   2.47 cm    TR Vmax:        231.00 cm/s MV Peak grad:  3.2 mmHg MV Mean grad:  1.0 mmHg    SHUNTS MV Vmax:       0.90 m/s    Systemic VTI:  0.22 m MV Vmean:      52.3 cm/s   Systemic Diam: 1.90 cm MV Decel Time: 283 msec MV E velocity: 56.10 cm/s MV A velocity: 72.30 cm/s MV E/A ratio:  0.78 Julien Nordmann MD Electronically signed by Julien Nordmann MD Signature Date/Time: 02/16/2023/11:49:55 AM    Final    CARDIAC CATHETERIZATION  Result Date: 02/15/2023   Prox RCA lesion is 30% stenosed.   1st Diag lesion is 50% stenosed.   The left ventricular systolic function is normal.   LV end diastolic pressure is normal.   The left ventricular ejection fraction is greater than 65% by visual estimate.   There is no aortic valve stenosis. Post-Cath Findings Severe spasm of the proximal RCA existing lesion (roughly 30%) => catheter related spasm was 90% reduced to 30% after IC NTG Mild D1 disease but otherwise no significant CAD. Normal LVEF with no RWMA and normal LVEDP.   I was able to exchanged for a guide catheter and gives IC NTG and the 90+ percent lesion resolved to about 30%.  This indicates that she very well has some coronary spasm, but does not require PCI. => Recommendation would be to increase antispasm medications-I have increased Imdur to 120 mg.  Could also consider calcium channel blocker but will defer to her primary cardiologist.  She has an ongoing consult by vascular surgery and they are aware of the results. She will be admitted to the medicine service. She was on Eliquis preop, as I am not sure if she will need any procedures, I have not reordered Eliquis but it ordered for heparin and start 2 hours after TR band removal.  Patient's cardiologist is Dr. Juliann Pares from [facility], he is aware, but-would need for formal consult. I will continue her other home medication regimen.  She has lots of pain control medications but I will defer to the hospitalist service  after admission. She is on 80 mg atorvastatin.  At home was on 60 mg of Imdur which I will increase to 120 mg.  Continue aspirin and low-dose Lopressor-limited by blood pressure. Bryan Lemma, MD   05/17/2021 LHC    Prox RCA lesion is 60% stenosed.   1st Diag lesion is 50% stenosed.   The left ventricular systolic function is normal.   LV end diastolic pressure is normal.  The left ventricular ejection fraction is 55-65% by visual estimate.   Conclusion Normal left ventricular function of at least 55% Coronaries Left main large free of disease LAD large minor irregularities Diagonal 1   50% ostial lesion Circumflex large free of disease RCA large 60-70% proximal Right dominant Intervention deferred Recommend medical therapy  ECHO 09/14/2022 INTERPRETATION  NORMAL LEFT VENTRICULAR SYSTOLIC FUNCTION  NORMAL RIGHT VENTRICULAR SYSTOLIC FUNCTION  TRIVIAL REGURGITATION NOTED (See above)  NO VALVULAR STENOSIS  TRIVIAL MR, TR  EF >55%  _________________________________________________________________________________________  Electronically signed by    Dorothyann Peng, MD on 09/14/2021 04: 41 PM           Performed By: Merla Riches, RDCS     Ordering Physician: Dorothyann Peng   TELEMETRY reviewed by me (LT) 02/19/2023 : none available for review  EKG reviewed by me: NSR 1mm STE in II, III, avF without reciprocal changes, Repeat EKG this morning showing NSR with slightly more pronounced STE inferiorly still without reciprocal changes, reviewed my me and Dr. Juliann Pares.  Data reviewed by me (LT) 02/19/2023: , vascular surgery note, hospitalist progress note last 24h vitals tele labs imaging I/O   Principal Problem:   Acute lower limb ischemia Active Problems:   Obesity, Class III, BMI 40-49.9 (morbid obesity) (HCC)   Paroxysmal atrial fibrillation (HCC)   COPD (chronic obstructive pulmonary disease) (HCC)   Chronic pain   Chronic diastolic CHF (congestive heart failure) (HCC)    Coronary artery disease involving native coronary artery of native heart   Atherosclerosis of artery of extremity with rest pain (HCC)   Amputation stump infection (HCC)   ST elevation    ASSESSMENT AND PLAN:   Kateena Degroote. Farino is a 62yoF with a PMH of HFpEF, CAD (Non-obstructive by cath 04/2021), severe PAD s/p right AKA (June 2023), with recent stump revision (February 2024) and left axillofemoral bypass (March 2024), paroxysmal A-fib, COPD, chronic pain, morbid obesity who presented to [name] via EMS on 02/15/2023 with acute on chronic left leg and foot pain with intermittent self resolving chest discomfort a few days prior.  Initial EKG showed subtle inferior ST elevations without reciprocal changes, and began having 7/10 chest pain during evaluation with the STEMI MD in the emergency department.  She was brought emergently to the Cath Lab where LHC revealed significant proximal RCA spasm exacerbated by the catheter placement which resolved the 90% lesion to 30% after IC nitroglycerin.  [facility] cardiology taking over care for further medical optimization prior to left leg amputation with vascular surgery.  # Severe PAD # S/p right AKA and left axillofemoral bypass # Medical optimization prior to vascular surgery Followed closely by vascular surgery with multiple prior interventions and an attempt at limb salvage.  Now left lower extremity is pulseless and vascular surgery is recommending left lower extremity amputation, likely Tuesday or Wednesday next week.  From a cardiac perspective, will obtain echocardiogram to evaluate LVEF, estimate by LV gram yesterday was 65%, and historically has preserved EF. -Continue aspirin 81 mg daily, indefinitely -Continue heparin per vascular surgery, also for anticoagulation with Hx paroxysmal AF -Continue atorvastatin 80 mg daily, recommend PCSK9 inhibitor as lipids are not at goal (LDL 121, triglycerides 390) -continue Cardizem 30 mg p.o. twice daily to  decrease coronary spasm, uptitrate as BP allows -increase isosorbide from 90 mg to 120mg  daily tomorrow AM -Echo resulted with low normal LVEF at 55-60% with grade 1 diastolic dysfunction. -A1c 5.7% -She is at mild-moderate risk for cardiovascular  complications without further modifiable risk factors or additional procedures recommended other than an echocardiogram prior to proceeding with vascular surgery.  Continue aspirin and Cardizem, pre-, peri-, and post procedurally.  # RCA spasm # Abnormal EKG c/f early inferior STEMI Plan as above -Fortunately remains chest pain-free today. -aggressive risk factor modification.   # Paroxysmal AF Rate appears regular by physical exam, no longer on telemetry today.  Continue heparin while inpatient in anticipation of left leg amputation, restart Eliquis 5 mg twice daily at discharge or when okay per vascular.  This patient's plan of care was discussed and created with Dr. Darrold Junker and he is in agreement.  Signed: Rebeca Allegra , PA-C 02/19/2023, 10:14 AM [name]

## 2023-02-19 NOTE — Progress Notes (Signed)
ANTICOAGULATION CONSULT NOTE -   Pharmacy Consult for IV heparin Indication: STEMI  Allergies  Allergen Reactions   Gabapentin     Tremors    Bactrim [Sulfamethoxazole-Trimethoprim] Itching    Patient Measurements: Height: 5\' 2"  (157.5 cm) Weight: 102.9 kg (226 lb 13.7 oz) IBW/kg (Calculated) : 50.1 Heparin Dosing Weight: 73.1 kg  Vital Signs: Temp: 98.5 F (36.9 C) (04/29 1632) Temp Source: Oral (04/29 0837) BP: 123/71 (04/29 1632) Pulse Rate: 98 (04/29 1632)  Labs: Recent Labs    02/17/23 0158 02/17/23 0159 02/17/23 0159 02/18/23 0542 02/19/23 0421 02/19/23 1147 02/19/23 1902  HGB  --  10.2*  --   --  10.2*  --   --   HCT  --  33.9*  --   --  34.1*  --   --   PLT  --  215  --   --  250  --   --   APTT  --  76*   < > 66* 58* 59* 67*  HEPARINUNFRC >1.10*  --   --  0.82* 0.71*  --   --   CREATININE  --  0.57  --   --  0.68  --   --    < > = values in this interval not displayed.     Estimated Creatinine Clearance: 80.9 mL/min (by C-G formula based on SCr of 0.68 mg/dL).   Medical History: Past Medical History:  Diagnosis Date   Abdominal aortic atherosclerosis (HCC)    a. 05/2017 CTA abd/pelvis: significant atherosclerotic dzs of the infrarenal abd Ao w/ some mural thrombus. No aneurysm or dissection.   Acute focal ischemia of small intestine (HCC)    Acute right-sided low back pain with right-sided sciatica 06/13/2017   AKI (acute kidney injury) (HCC) 07/29/2017   Anemia    Anginal pain (HCC)    a. 08/2012 Lexiscan MV: EF 54%, non ischemia/infarct.   Anxiety and depression    Baker's cyst of knee, right    a. 07/2016 U/S: 4.1 x 1.4 x 2.9 cystic structure in R poplitetal fossa.   Bell's palsy    CAD (coronary artery disease)    CHF (congestive heart failure) (HCC)    a.) TTE 06/20/2017: EF 60-65%, no rwma, G1DD, no source of cardiac emboli.   Chronic anticoagulation    a.) apixaban   Chronic idiopathic constipation    Chronic mesenteric ischemia (HCC)     Chronic pain    COPD (chronic obstructive pulmonary disease) (HCC)    Dyspnea    Embolus of superior mesenteric artery (HCC)    a. 05/2017 CTA Abd/pelvis: apparent thrombus or embolus in prox SMA (70-90%); b. 05/2017 catheter directed tPA, mechanical thrombectomy, and stenting of the SMA.   Essential hypertension with goal blood pressure less than 140/90 01/19/2016   Gastroesophageal reflux disease 01/19/2016   GERD (gastroesophageal reflux disease)    H/O colectomy    History of 2019 novel coronavirus disease (COVID-19) 11/14/2019   History of kidney stones    Hyperlipidemia    Hypertension    Morbid obesity with BMI of 40.0-44.9, adult (HCC)    NSTEMI (non-ST elevated myocardial infarction) (HCC) 06/27/2021   a.) high sensitivity troponins trended: 893 --> 1587 --> 1748 --> 868 --> 735 ng/L   Occlusive mesenteric ischemia (HCC) 06/19/2017   Osteoarthritis    PAD (peripheral artery disease) (HCC)    PAF (paroxysmal atrial fibrillation) (HCC)    a.) CHA2DS2-VASc = 6 (sex, CHF, HTN, DVT x2, aortic plaque).  b.) rate/rhythm maintained on oral metoprolol tartrate; chronically anticoagulated with standard dose apixaban.   Palpitations    Peripheral edema    Personal history of other malignant neoplasm of skin 06/01/2016   Pneumonia    Pre-diabetes    Primary osteoarthritis of both knees 06/13/2017   SBO (small bowel obstruction) (HCC) 08/06/2017   Skin cancer of nose    Superior mesenteric artery thrombosis (HCC)    Vitamin D deficiency     Medications:  Eliquis 5 mg BID  Assessment: 62 year old female admitted with STEMI now s/p cath. PMH includes PAD s/p right AKA, CAD, HFpEF, HTN, HLD, atrial fibrillation on Eliquis prior to admission. Pharmacy has been consulted to start heparin 2 hours after radial band removal.  4/26 @ 0400:  aPTT = 37,  HL = > 1.10 rate inc to 1050 u/hr 4/26 1149 aPTT=56  subtherapeutic,  1050>> 4/26  2012 aPTT 83 4/27  0159:  aPTT = 76,   HL = > 1.10   4/28  0542:  aPTT = 66,   HL = 0.82 4/29  0421:  aPTT = 58,   HL = 0.71 4/29  1147:  aPTT = 59  inc to 1500 units/hr. 4/29  1902:  aPTT = 67  slightly therapeutic , slightly inc to 1550 units/hr   Goal of Therapy:  HL :  0.3 - 0.7 aPTT 66-85 seconds  Monitor platelets by anticoagulation protocol: Yes   Plan:  4/29  1902:  aPTT = 67  - aPTT slightly therapeutic  - Will slightly increase drip rate to 1550 units/hr. - Will recheck aPTT 6 hrs after rate change - Will recheck HL on 4/30 with aPTT -planning for AKA 02/21/23 and maintain on heparin drip until then -Use aPTT to guide dosing until correlating with HL CBC daily  Bari Mantis PharmD Clinical Pharmacist 02/19/2023

## 2023-02-19 NOTE — Progress Notes (Signed)
[facility]  Daily Progress Note   Subjective  -   Patient still having rest pain in the left lower leg.  No fevers or chills.  No open wounds.  Objective Vitals:   02/19/23 0948 02/19/23 1000 02/19/23 1051 02/19/23 1258  BP:      Pulse:      Resp: 16 16 18 18   Temp:      TempSrc:      SpO2:      Weight:      Height:        Intake/Output Summary (Last 24 hours) at 02/19/2023 1324 Last data filed at 02/19/2023 0519 Gross per 24 hour  Intake 600 ml  Output 3700 ml  Net -3100 ml    PULM  CTAB CV  RRR VASC  left foot is cool with diminished capillary refill  Laboratory CBC    Component Value Date/Time   WBC 6.4 02/19/2023 0421   HGB 10.2 (L) 02/19/2023 0421   HGB 13.4 02/12/2015 1137   HCT 34.1 (L) 02/19/2023 0421   HCT 40.9 02/12/2015 1137   PLT 250 02/19/2023 0421   PLT 272 02/12/2015 1137    BMET    Component Value Date/Time   NA 139 02/19/2023 0421   NA 140 02/12/2015 1137   K 3.8 02/19/2023 0421   K 2.9 (L) 02/12/2015 1137   CL 101 02/19/2023 0421   CL 107 02/12/2015 1137   CO2 27 02/19/2023 0421   CO2 27 02/12/2015 1137   GLUCOSE 151 (H) 02/19/2023 0421   GLUCOSE 120 (H) 02/12/2015 1137   BUN 9 02/19/2023 0421   BUN 15 02/12/2015 1137   CREATININE 0.68 02/19/2023 0421   CREATININE 0.78 02/12/2015 1137   CALCIUM 9.2 02/19/2023 0421   CALCIUM 9.0 02/12/2015 1137   GFRNONAA >60 02/19/2023 0421   GFRNONAA >60 02/12/2015 1137   GFRAA >60 11/23/2019 1330   GFRAA >60 02/12/2015 1137    Assessment/Planning:   Ischemic left lower extremity with no further revascularization options likely present Has already failed multiple endovascular options, aorto-iliac stent graft placement, left axillofemoral bypass and intervention on the bypass from the radial approach which is no longer an option. At this point, left above-knee amputation will be the only option for pain relief She is still having significant rest pain on the left I  will plan on a left above-knee amputation on Wednesday, May 1. Risks and benefits were discussed in detail with patient and she is agreeable to proceeding   Festus Barren  02/19/2023, 1:24 PM

## 2023-02-19 NOTE — Progress Notes (Signed)
ANTICOAGULATION CONSULT NOTE -   Pharmacy Consult for IV heparin Indication: STEMI  Allergies  Allergen Reactions   Gabapentin     Tremors    Bactrim [Sulfamethoxazole-Trimethoprim] Itching    Patient Measurements: Height: 5\' 2"  (157.5 cm) Weight: 102.9 kg (226 lb 13.7 oz) IBW/kg (Calculated) : 50.1 Heparin Dosing Weight: 73.1 kg  Vital Signs: Temp: 98.5 F (36.9 C) (04/29 0837) Temp Source: Oral (04/29 0837) BP: 115/65 (04/29 0654) Pulse Rate: 87 (04/29 0654)  Labs: Recent Labs    02/17/23 0158 02/17/23 0159 02/18/23 0542 02/19/23 0421 02/19/23 1147  HGB  --  10.2*  --  10.2*  --   HCT  --  33.9*  --  34.1*  --   PLT  --  215  --  250  --   APTT  --  76* 66* 58* 59*  HEPARINUNFRC >1.10*  --  0.82* 0.71*  --   CREATININE  --  0.57  --  0.68  --      Estimated Creatinine Clearance: 80.9 mL/min (by C-G formula based on SCr of 0.68 mg/dL).   Medical History: Past Medical History:  Diagnosis Date   Abdominal aortic atherosclerosis (HCC)    a. 05/2017 CTA abd/pelvis: significant atherosclerotic dzs of the infrarenal abd Ao w/ some mural thrombus. No aneurysm or dissection.   Acute focal ischemia of small intestine (HCC)    Acute right-sided low back pain with right-sided sciatica 06/13/2017   AKI (acute kidney injury) (HCC) 07/29/2017   Anemia    Anginal pain (HCC)    a. 08/2012 Lexiscan MV: EF 54%, non ischemia/infarct.   Anxiety and depression    Baker's cyst of knee, right    a. 07/2016 U/S: 4.1 x 1.4 x 2.9 cystic structure in R poplitetal fossa.   Bell's palsy    CAD (coronary artery disease)    CHF (congestive heart failure) (HCC)    a.) TTE 06/20/2017: EF 60-65%, no rwma, G1DD, no source of cardiac emboli.   Chronic anticoagulation    a.) apixaban   Chronic idiopathic constipation    Chronic mesenteric ischemia (HCC)    Chronic pain    COPD (chronic obstructive pulmonary disease) (HCC)    Dyspnea    Embolus of superior mesenteric artery (HCC)     a. 05/2017 CTA Abd/pelvis: apparent thrombus or embolus in prox SMA (70-90%); b. 05/2017 catheter directed tPA, mechanical thrombectomy, and stenting of the SMA.   Essential hypertension with goal blood pressure less than 140/90 01/19/2016   Gastroesophageal reflux disease 01/19/2016   GERD (gastroesophageal reflux disease)    H/O colectomy    History of 2019 novel coronavirus disease (COVID-19) 11/14/2019   History of kidney stones    Hyperlipidemia    Hypertension    Morbid obesity with BMI of 40.0-44.9, adult (HCC)    NSTEMI (non-ST elevated myocardial infarction) (HCC) 06/27/2021   a.) high sensitivity troponins trended: 893 --> 1587 --> 1748 --> 868 --> 735 ng/L   Occlusive mesenteric ischemia (HCC) 06/19/2017   Osteoarthritis    PAD (peripheral artery disease) (HCC)    PAF (paroxysmal atrial fibrillation) (HCC)    a.) CHA2DS2-VASc = 6 (sex, CHF, HTN, DVT x2, aortic plaque). b.) rate/rhythm maintained on oral metoprolol tartrate; chronically anticoagulated with standard dose apixaban.   Palpitations    Peripheral edema    Personal history of other malignant neoplasm of skin 06/01/2016   Pneumonia    Pre-diabetes    Primary osteoarthritis of both knees 06/13/2017  SBO (small bowel obstruction) (HCC) 08/06/2017   Skin cancer of nose    Superior mesenteric artery thrombosis (HCC)    Vitamin D deficiency     Medications:  Eliquis 5 mg BID  Assessment: 62 year old female admitted with STEMI now s/p cath. PMH includes PAD s/p right AKA, CAD, HFpEF, HTN, HLD, atrial fibrillation on Eliquis prior to admission. Pharmacy has been consulted to start heparin 2 hours after radial band removal.  4/26 @ 0400:  aPTT = 37,  HL = > 1.10 rate inc to 1050 u/hr 4/26 1149 aPTT=56  subtherapeutic,  1050>> 4/26  2012 aPTT 83 4/27  0159:  aPTT = 76,   HL = > 1.10  4/28  0542:  aPTT = 66,   HL = 0.82 4/29  0421:  aPTT = 58,   HL = 0.71 4/29  1147:  aPTT = 59  Goal of Therapy:  HL :  0.3 -  0.7 aPTT 66-85 seconds  Monitor platelets by anticoagulation protocol: Yes   Plan:  4/29 @ 1147:  aPTT = 59 - aPTT slightly subtherapeutic  - Will order heparin 1100 units IV X 1 bolus and increase drip rate to 15000 units/hr. - Will recheck aPTT 6 hrs after rate change - Will recheck HL on 4/30 with AM labs -planning for AKA 02/21/23 and maintain on heparin drip until then -Use aPTT to guide dosing until correlating with HL CBC daily  Clovia Cuff, PharmD, BCPS 02/19/2023 2:13 PM

## 2023-02-19 NOTE — Care Management Important Message (Signed)
Important Message  Patient Details  Name: SUBRINA VECCHIARELLI MRN: [id number] Date of Birth: April 05, 1961   Medicare Important Message Given:  Yes     Johnell Comings 02/19/2023, 10:55 AM

## 2023-02-19 NOTE — Progress Notes (Signed)
ANTICOAGULATION CONSULT NOTE -   Pharmacy Consult for IV heparin Indication: STEMI  Allergies  Allergen Reactions   Gabapentin     Tremors    Bactrim [Sulfamethoxazole-Trimethoprim] Itching    Patient Measurements: Height: 5\' 2"  (157.5 cm) Weight: 102.9 kg (226 lb 13.7 oz) IBW/kg (Calculated) : 50.1 Heparin Dosing Weight: 73.1 kg  Vital Signs:    Labs: Recent Labs    02/16/23 1141 02/16/23 1149 02/17/23 0158 02/17/23 0159 02/18/23 0542 02/19/23 0421  HGB 9.5*  --   --  10.2*  --  10.2*  HCT 30.5*  --   --  33.9*  --  34.1*  PLT 220  --   --  215  --  250  APTT  --    < >  --  76* 66* 58*  HEPARINUNFRC  --   --  >1.10*  --  0.82* 0.71*  CREATININE 0.50  --   --  0.57  --  0.68   < > = values in this interval not displayed.     Estimated Creatinine Clearance: 80.9 mL/min (by C-G formula based on SCr of 0.68 mg/dL).   Medical History: Past Medical History:  Diagnosis Date   Abdominal aortic atherosclerosis (HCC)    a. 05/2017 CTA abd/pelvis: significant atherosclerotic dzs of the infrarenal abd Ao w/ some mural thrombus. No aneurysm or dissection.   Acute focal ischemia of small intestine (HCC)    Acute right-sided low back pain with right-sided sciatica 06/13/2017   AKI (acute kidney injury) (HCC) 07/29/2017   Anemia    Anginal pain (HCC)    a. 08/2012 Lexiscan MV: EF 54%, non ischemia/infarct.   Anxiety and depression    Baker's cyst of knee, right    a. 07/2016 U/S: 4.1 x 1.4 x 2.9 cystic structure in R poplitetal fossa.   Bell's palsy    CAD (coronary artery disease)    CHF (congestive heart failure) (HCC)    a.) TTE 06/20/2017: EF 60-65%, no rwma, G1DD, no source of cardiac emboli.   Chronic anticoagulation    a.) apixaban   Chronic idiopathic constipation    Chronic mesenteric ischemia (HCC)    Chronic pain    COPD (chronic obstructive pulmonary disease) (HCC)    Dyspnea    Embolus of superior mesenteric artery (HCC)    a. 05/2017 CTA Abd/pelvis:  apparent thrombus or embolus in prox SMA (70-90%); b. 05/2017 catheter directed tPA, mechanical thrombectomy, and stenting of the SMA.   Essential hypertension with goal blood pressure less than 140/90 01/19/2016   Gastroesophageal reflux disease 01/19/2016   GERD (gastroesophageal reflux disease)    H/O colectomy    History of 2019 novel coronavirus disease (COVID-19) 11/14/2019   History of kidney stones    Hyperlipidemia    Hypertension    Morbid obesity with BMI of 40.0-44.9, adult (HCC)    NSTEMI (non-ST elevated myocardial infarction) (HCC) 06/27/2021   a.) high sensitivity troponins trended: 893 --> 1587 --> 1748 --> 868 --> 735 ng/L   Occlusive mesenteric ischemia (HCC) 06/19/2017   Osteoarthritis    PAD (peripheral artery disease) (HCC)    PAF (paroxysmal atrial fibrillation) (HCC)    a.) CHA2DS2-VASc = 6 (sex, CHF, HTN, DVT x2, aortic plaque). b.) rate/rhythm maintained on oral metoprolol tartrate; chronically anticoagulated with standard dose apixaban.   Palpitations    Peripheral edema    Personal history of other malignant neoplasm of skin 06/01/2016   Pneumonia    Pre-diabetes  Primary osteoarthritis of both knees 06/13/2017   SBO (small bowel obstruction) (HCC) 08/06/2017   Skin cancer of nose    Superior mesenteric artery thrombosis (HCC)    Vitamin D deficiency     Medications:  Eliquis 5 mg BID  Assessment: 62 year old female admitted with STEMI now s/p cath. PMH includes PAD s/p right AKA, CAD, HFpEF, HTN, HLD, atrial fibrillation on Eliquis prior to admission. Pharmacy has been consulted to start heparin 2 hours after radial band removal.  4/26 @ 0400:  aPTT = 37,  HL = > 1.10 rate inc to 1050 u/hr 4/26 1149 aPTT=56  subtherapeutic,  1050>> 4/26  2012 aPTT 83 4/27  0159:  aPTT = 76,   HL = > 1.10  4/28  0542:  aPTT = 66,   HL = 0.82 4/29  0421:  aPTT = 58,   HL = 0.71  Goal of Therapy:  HL :  0.3 - 0.7 aPTT 66-85 seconds  Monitor platelets by  anticoagulation protocol: Yes   Plan:  4/29 @ 0421:  aPTT = 58,  HL = 0.72 - aPTT slightly subtherapeutic ,  HL still elevated from Eliquis PTA - Will order heparin 1100 units IV X 1 bolus and increase drip rate to 1350 units/hr. - Will recheck aPTT 6 hrs after rate change - Will recheck HL on 4/30 with AM labs  planning for AKA 02/21/23 and maintain on heparin drip until then Use aPTT to guide dosing until correlating with HL CBC daily  [name] D, PharmD Clinical Pharmacist   02/19/2023 6:05 AM

## 2023-02-19 NOTE — Progress Notes (Signed)
Progress Note   Patient: Carla [name] ZOX:[id number] DOB: Mar 12, 1961 DOA: 02/15/2023     4 DOS: the patient was seen and examined on 02/19/2023   Brief hospital course: 62 y.o. female with medical history significant of severe PAD s/p right AKA, CAD, HFpEF, hypertension, hyperlipidemia, paroxysmal atrial fibrillation on Eliquis, SMA thrombosis, who presents to the ED due to leg pain.   Carla [name] states she has been experiencing left lower extremity pain for the last 1 week, however it suddenly worsened this morning and that is why she called EMS.  She endorses chronic numbness and tingling that is unchanged.  In addition, she has been experiencing intermittent chest pain that occurs at rest and self resolves.  She denies any shortness of breath or palpitations.  She denies any nausea, vomiting, diarrhea.  She endorses some pain overlying her umbilical hernia but no other abdominal pain.  Cardiac catheterization showed a proximal RCA lesion 30% stenosed and a first diagonal lesion 50% stenosis, EF 65% by visual estimate.  ST elevation believed to be secondary to coronary spasm and Imdur was increased.  4/26.  Patient upset about vascular team talking about amputation of her left leg.  Patient still having pain left leg. 4/27.  Patient still very upset but agreeable to amputation.  Still having pain in left leg. 4/28.  Patient stating she wants to go up on her oral pain medications because she is in severe pain with her left leg.  She was on Norco 07/25/2024 and I will switch to oxycodone 15 mg every 4 hours as needed for pain. 4/29.  Patient states he is in a lot of pain with her left knee.  Does not want to change pain medications currently.  Assessment and Plan: * Acute lower limb ischemia Extensive history of PAD, with prior right AKA (June 2023) with recent stump revision (February 2024), and left femoral bypass mechanical thrombectomy (March 2024). Patient is presenting with 1 week of left lower  extremity. - Oral (increased to oxycodone 15 mg every 4 hours as needed on 4/28) and IV pain medications -Vascular team set up for amputation for May 1.  ST elevation Patient presented with chest pain has been intermittent, in addition to ST elevation in the lateral leads.  Left heart cath demonstrated RCA spasm, resolved with nitroglycerin.  No stenting required. - Imdur increased to 90 mg daily - Continue Cardizem if blood pressure allows.  Coronary artery disease involving native coronary artery of native heart - Nonobstructive CAD, continue aspirin, Imdur  Amputation stump infection (HCC) Stump wound covered by wound VAC. - Continue home Omadacyclin - Wound VAC per vascular surgery   Paroxysmal atrial fibrillation (HCC) - Cardizem if blood pressure allows. - Anticoagulation with heparin  Chronic diastolic CHF (congestive heart failure) (HCC) Patient appears euvolemic at this time.   COPD (chronic obstructive pulmonary disease) (HCC) Continue home bronchodilators with DuoNebs as needed.  Chronic pain Increased oxycodone on 4/28 and continue with Dilaudid for breakthrough pain given acute ischemia  Obesity, Class III, BMI 40-49.9 (morbid obesity) (HCC) BMI 41.49 with current height and weight.        Subjective: Patient in a lot of pain with her left knee.  Physical Exam: Vitals:   02/19/23 1051 02/19/23 1258 02/19/23 1330 02/19/23 1426  BP:      Pulse:      Resp: 18 18 18 16   Temp:      TempSrc:      SpO2:  Weight:      Height:       Physical Exam HENT:     Head: Normocephalic.     Mouth/Throat:     Pharynx: No oropharyngeal exudate.  Eyes:     General: Lids are normal.     Conjunctiva/sclera: Conjunctivae normal.  Cardiovascular:     Rate and Rhythm: Normal rate and regular rhythm.     Heart sounds: Normal heart sounds, S1 normal and S2 normal.  Pulmonary:     Breath sounds: No decreased breath sounds, wheezing, rhonchi or rales.  Abdominal:      Palpations: Abdomen is soft.     Tenderness: There is no abdominal tenderness.  Musculoskeletal:     Left lower leg: Swelling present.     Right Lower Extremity: Right leg is amputated above knee.  Skin:    General: Skin is warm.     Comments: Small wound VAC over right stump.  Neurological:     Mental Status: She is alert and oriented to person, place, and time.     Data Reviewed: Creatinine 0.68, hemoglobin 10.2  Family Communication: Updated patient's daughter yesterday  Disposition: Status is: Inpatient Remains inpatient appropriate because: For left AKA on Wednesday, May 1  Planned Discharge Destination: Home    Time spent: 28 minutes  Author: Alford Highland, MD 02/19/2023 2:55 PM  For on call review [email or web address].

## 2023-02-20 DIAGNOSIS — R9431 Abnormal electrocardiogram [ECG] [EKG]: Secondary | ICD-10-CM | POA: Diagnosis not present

## 2023-02-20 DIAGNOSIS — I998 Other disorder of circulatory system: Secondary | ICD-10-CM | POA: Diagnosis not present

## 2023-02-20 DIAGNOSIS — K5909 Other constipation: Secondary | ICD-10-CM

## 2023-02-20 DIAGNOSIS — I480 Paroxysmal atrial fibrillation: Secondary | ICD-10-CM | POA: Diagnosis not present

## 2023-02-20 DIAGNOSIS — I2510 Atherosclerotic heart disease of native coronary artery without angina pectoris: Secondary | ICD-10-CM | POA: Diagnosis not present

## 2023-02-20 DIAGNOSIS — K5900 Constipation, unspecified: Secondary | ICD-10-CM | POA: Insufficient documentation

## 2023-02-20 DIAGNOSIS — G8929 Other chronic pain: Secondary | ICD-10-CM

## 2023-02-20 LAB — CBC
HCT: 32.3 % — ABNORMAL LOW (ref 36.0–46.0)
Hemoglobin: 10 g/dL — ABNORMAL LOW (ref 12.0–15.0)
MCH: 27.6 pg (ref 26.0–34.0)
MCHC: 31 g/dL (ref 30.0–36.0)
MCV: 89.2 fL (ref 80.0–100.0)
Platelets: 254 10*3/uL (ref 150–400)
RBC: 3.62 MIL/uL — ABNORMAL LOW (ref 3.87–5.11)
RDW: 16.9 % — ABNORMAL HIGH (ref 11.5–15.5)
WBC: 7.1 10*3/uL (ref 4.0–10.5)
nRBC: 0 % (ref 0.0–0.2)

## 2023-02-20 LAB — TYPE AND SCREEN
ABO/RH(D): A POS
Antibody Screen: NEGATIVE

## 2023-02-20 LAB — APTT
aPTT: 79 seconds — ABNORMAL HIGH (ref 24–36)
aPTT: 79 seconds — ABNORMAL HIGH (ref 24–36)

## 2023-02-20 LAB — HEPARIN LEVEL (UNFRACTIONATED): Heparin Unfractionated: 0.83 IU/mL — ABNORMAL HIGH (ref 0.30–0.70)

## 2023-02-20 MED ORDER — ENSURE ENLIVE PO LIQD
237.0000 mL | Freq: Three times a day (TID) | ORAL | Status: DC
  Administered 2023-02-20 – 2023-02-24 (×10): 237 mL via ORAL

## 2023-02-20 MED ORDER — POLYETHYLENE GLYCOL 3350 17 G PO PACK
17.0000 g | PACK | Freq: Two times a day (BID) | ORAL | Status: DC
  Administered 2023-02-20 – 2023-02-24 (×7): 17 g via ORAL
  Filled 2023-02-20 (×7): qty 1

## 2023-02-20 MED ORDER — LACTULOSE 10 GM/15ML PO SOLN
30.0000 g | Freq: Once | ORAL | Status: AC
  Administered 2023-02-20: 30 g via ORAL
  Filled 2023-02-20: qty 60

## 2023-02-20 MED ORDER — ISOSORBIDE MONONITRATE ER 30 MG PO TB24
120.0000 mg | ORAL_TABLET | Freq: Every day | ORAL | Status: DC
  Administered 2023-02-20 – 2023-02-24 (×5): 120 mg via ORAL
  Filled 2023-02-20 (×5): qty 4

## 2023-02-20 NOTE — Assessment & Plan Note (Signed)
Increase MiraLAX to twice daily dosing.  Given a dose of lactulose.

## 2023-02-20 NOTE — Progress Notes (Signed)
Progress Note   Patient: Carla [name]:[id number] DOB: 05-May-1961 DOA: 02/15/2023     5 DOS: the patient was seen and examined on 02/20/2023   Brief hospital course: 62 y.o. female with medical history significant of severe PAD s/p right AKA, CAD, HFpEF, hypertension, hyperlipidemia, paroxysmal atrial fibrillation on Eliquis, SMA thrombosis, who presents to the ED due to leg pain.   Carla [name] states she has been experiencing left lower extremity pain for the last 1 week, however it suddenly worsened this morning and that is why she called EMS.  She endorses chronic numbness and tingling that is unchanged.  In addition, she has been experiencing intermittent chest pain that occurs at rest and self resolves.  She denies any shortness of breath or palpitations.  She denies any nausea, vomiting, diarrhea.  She endorses some pain overlying her umbilical hernia but no other abdominal pain.  Cardiac catheterization showed a proximal RCA lesion 30% stenosed and a first diagonal lesion 50% stenosis, EF 65% by visual estimate.  ST elevation believed to be secondary to coronary spasm and Imdur was increased.  4/26.  Patient upset about vascular team talking about amputation of her left leg.  Patient still having pain left leg. 4/27.  Patient still very upset but agreeable to amputation.  Still having pain in left leg. 4/28.  Patient stating she wants to go up on her oral pain medications because she is in severe pain with her left leg.  She was on Norco 07/25/2024 and I will switch to oxycodone 15 mg every 4 hours as needed for pain. 4/29.  Patient states he is in a lot of pain with her left knee.  Does not want to change pain medications currently. 4/30.  Patient still has not had a bowel movement despite MiraLAX.  Will increase MiraLAX to twice daily.  Will give a dose of lactulose.  Assessment and Plan: * Acute lower limb ischemia Extensive history of PAD, with prior right AKA (June 2023) with recent  stump revision (February 2024), and left femoral bypass mechanical thrombectomy (March 2024). Patient is presenting with 1 week of left lower extremity. - Oral (increased to oxycodone 15 mg every 4 hours as needed on 4/28) and IV pain medications -Vascular team set up for left AKA for May 1.  ST elevation Patient presented with chest pain has been intermittent, in addition to ST elevation in the lateral leads.  Left heart cath demonstrated RCA spasm, resolved with nitroglycerin.  No stenting required. - Imdur increased to 120 mg daily - Continue Cardizem if blood pressure allows.  Coronary artery disease involving native coronary artery of native heart - Nonobstructive CAD, continue aspirin, Imdur  Amputation stump infection (HCC) Stump wound covered by wound VAC. - Continue home Omadacyclin to completion - Wound VAC per vascular surgery   Paroxysmal atrial fibrillation (HCC) - Cardizem if blood pressure allows. - Anticoagulation with heparin  Chronic diastolic CHF (congestive heart failure) (HCC) Patient appears euvolemic at this time.   COPD (chronic obstructive pulmonary disease) (HCC) Continue home bronchodilators with DuoNebs as needed.  Chronic pain Increased oxycodone on 4/28 and continue with Dilaudid for breakthrough pain given acute ischemia  Obesity, Class III, BMI 40-49.9 (morbid obesity) (HCC) BMI 41.49 with current height and weight.  Constipation Increase MiraLAX to twice daily dosing.  Given a dose of lactulose.        Subjective: Patient still has not had a bowel movement.  Asking for some ensures because normally she goes  with it.  Will give a dose of lactulose.  Awaiting to have left AKA tomorrow.  Admitted with ischemia left leg.  Physical Exam: Vitals:   02/19/23 1747 02/19/23 1853 02/19/23 2254 02/19/23 2344  BP:   108/73 128/61  Pulse:   99 82  Resp: 16 18 20 20   Temp:   99 F (37.2 C) 98.5 F (36.9 C)  TempSrc:      SpO2:    93%  Weight:       Height:       Physical Exam HENT:     Head: Normocephalic.     Mouth/Throat:     Pharynx: No oropharyngeal exudate.  Eyes:     General: Lids are normal.     Conjunctiva/sclera: Conjunctivae normal.  Cardiovascular:     Rate and Rhythm: Normal rate and regular rhythm.     Heart sounds: Normal heart sounds, S1 normal and S2 normal.  Pulmonary:     Breath sounds: No decreased breath sounds, wheezing, rhonchi or rales.  Abdominal:     Palpations: Abdomen is soft.     Tenderness: There is no abdominal tenderness.  Musculoskeletal:     Left lower leg: Swelling present.     Right Lower Extremity: Right leg is amputated above knee.  Skin:    General: Skin is warm.     Comments: Small wound VAC over right stump.  Neurological:     Mental Status: She is alert and oriented to person, place, and time.     Data Reviewed: Last hemoglobin 10  Family Communication: Updated patient's daughter yesterday  Disposition: Status is: Inpatient Remains inpatient appropriate because: Left AKA scheduled for 5/1  Planned Discharge Destination: Home    Time spent: 27 minutes  Author: Alford Highland, MD 02/20/2023 2:54 PM  For on call review [email or web address].

## 2023-02-20 NOTE — Progress Notes (Signed)
ANTICOAGULATION CONSULT NOTE   Pharmacy Consult for IV heparin Indication: STEMI  Allergies  Allergen Reactions   Gabapentin     Tremors    Bactrim [Sulfamethoxazole-Trimethoprim] Itching    Patient Measurements: Height: 5\' 2"  (157.5 cm) Weight: 102.9 kg (226 lb 13.7 oz) IBW/kg (Calculated) : 50.1 Heparin Dosing Weight: 73.1 kg  Vital Signs: Temp: 98.5 F (36.9 C) (04/29 2344) BP: 128/61 (04/29 2344) Pulse Rate: 82 (04/29 2344)  Labs: Recent Labs    02/18/23 0542 02/19/23 0421 02/19/23 1147 02/19/23 1902 02/20/23 0434  HGB  --  10.2*  --   --  10.0*  HCT  --  34.1*  --   --  32.3*  PLT  --  250  --   --  254  APTT 66* 58* 59* 67* 79*  HEPARINUNFRC 0.82* 0.71*  --   --  0.83*  CREATININE  --  0.68  --   --   --      Estimated Creatinine Clearance: 80.9 mL/min (by C-G formula based on SCr of 0.68 mg/dL).   Medical History: Past Medical History:  Diagnosis Date   Abdominal aortic atherosclerosis (HCC)    a. 05/2017 CTA abd/pelvis: significant atherosclerotic dzs of the infrarenal abd Ao w/ some mural thrombus. No aneurysm or dissection.   Acute focal ischemia of small intestine (HCC)    Acute right-sided low back pain with right-sided sciatica 06/13/2017   AKI (acute kidney injury) (HCC) 07/29/2017   Anemia    Anginal pain (HCC)    a. 08/2012 Lexiscan MV: EF 54%, non ischemia/infarct.   Anxiety and depression    Baker's cyst of knee, right    a. 07/2016 U/S: 4.1 x 1.4 x 2.9 cystic structure in R poplitetal fossa.   Bell's palsy    CAD (coronary artery disease)    CHF (congestive heart failure) (HCC)    a.) TTE 06/20/2017: EF 60-65%, no rwma, G1DD, no source of cardiac emboli.   Chronic anticoagulation    a.) apixaban   Chronic idiopathic constipation    Chronic mesenteric ischemia (HCC)    Chronic pain    COPD (chronic obstructive pulmonary disease) (HCC)    Dyspnea    Embolus of superior mesenteric artery (HCC)    a. 05/2017 CTA Abd/pelvis: apparent  thrombus or embolus in prox SMA (70-90%); b. 05/2017 catheter directed tPA, mechanical thrombectomy, and stenting of the SMA.   Essential hypertension with goal blood pressure less than 140/90 01/19/2016   Gastroesophageal reflux disease 01/19/2016   GERD (gastroesophageal reflux disease)    H/O colectomy    History of 2019 novel coronavirus disease (COVID-19) 11/14/2019   History of kidney stones    Hyperlipidemia    Hypertension    Morbid obesity with BMI of 40.0-44.9, adult (HCC)    NSTEMI (non-ST elevated myocardial infarction) (HCC) 06/27/2021   a.) high sensitivity troponins trended: 893 --> 1587 --> 1748 --> 868 --> 735 ng/L   Occlusive mesenteric ischemia (HCC) 06/19/2017   Osteoarthritis    PAD (peripheral artery disease) (HCC)    PAF (paroxysmal atrial fibrillation) (HCC)    a.) CHA2DS2-VASc = 6 (sex, CHF, HTN, DVT x2, aortic plaque). b.) rate/rhythm maintained on oral metoprolol tartrate; chronically anticoagulated with standard dose apixaban.   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HCC)    Vitamin D deficiency     Medications:  Eliquis 5 mg BID  Assessment: 62 year old female admitted with STEMI now s/p cath. PMH includes PAD s/p right AKA, CAD, HFpEF, HTN, HLD, atrial fibrillation on Eliquis prior to admission. Pharmacy has been consulted to start heparin 2 hours after radial band removal.  4/26 @ 0400:  aPTT = 37,  HL = > 1.10 rate inc to 1050 u/hr 4/26 1149 aPTT=56  subtherapeutic,  1050>> 4/26  2012 aPTT 83 4/27  0159:  aPTT = 76,   HL = > 1.10  4/28  0542:  aPTT = 66,   HL = 0.82 4/29  0421:  aPTT = 58,   HL = 0.71 4/29  1147:  aPTT = 59  inc to 1500 units/hr. 4/29  1902:  aPTT = 67  slightly therapeutic ,  slightly inc to 1550 units/hr  4/29 0434 aPTT 79 sec, therapeutic x 1, HL 0.83 not correlating  Goal of Therapy:  HL :  0.3 - 0.7 aPTT 66-85 seconds  Monitor platelets by anticoagulation protocol: Yes   Plan:  - aPTT therapeutic  - Will continue drip rate at 1550 units/hr. - Will recheck aPTT at 1800 to reconfirm, then daily - Will recheck HL on 5/1 with AM labs -planning for AKA 02/21/23 and maintain on heparin drip until then -Use aPTT to guide dosing until correlating with HL CBC daily  Otelia Sergeant, PharmD, [facility] 02/20/2023 5:11 AM

## 2023-02-20 NOTE — H&P (View-Only) (Signed)
Progress Note    02/20/2023 4:06 PM 5 Days Post-Op  Subjective: Carla [name] is a 62-year-old female who is well-known to vascular surgery.  She still continues to have pain at rest in her left lower leg due to claudication and ischemia.  Denies any chest pain or shortness of breath today.  She denies any fevers or chills.  There is no open wounds to her left lower extremity.  Is all remained stable.   Vitals:   02/19/23 2254 02/19/23 2344  BP: 108/73 128/61  Pulse: 99 82  Resp: 20 20  Temp: 99 F (37.2 C) 98.5 F (36.9 C)  SpO2:  93%   Physical Exam: Cardiac:  AFIB, controlled rate, No murmurs  Lungs:  Clear on auscultation throughout. Without rales rhonchi or wheezing. Nonlabored breathing.  Incisions:  NONE  Extremities:  Rt AKA, Left lower extremity without pulses femoral, popliteal, Post tibial and dorsalis pedis.  Abdomen:  Positive bowel sounds with positive hernia, soft, NT/ND, no masses  Neurologic:  A&O X 3; No focal weakness or paresthesias are detected; speech is fluent/normal   CBC    Component Value Date/Time   WBC 7.1 02/20/2023 0434   RBC 3.62 (L) 02/20/2023 0434   HGB 10.0 (L) 02/20/2023 0434   HGB 13.4 02/12/2015 1137   HCT 32.3 (L) 02/20/2023 0434   HCT 40.9 02/12/2015 1137   PLT 254 02/20/2023 0434   PLT 272 02/12/2015 1137   MCV 89.2 02/20/2023 0434   MCV 90 02/12/2015 1137   MCH 27.6 02/20/2023 0434   MCHC 31.0 02/20/2023 0434   RDW 16.9 (H) 02/20/2023 0434   RDW 14.8 (H) 02/12/2015 1137   LYMPHSABS 1.6 02/16/2023 1141   LYMPHSABS 1.7 08/27/2014 1145   MONOABS 0.4 02/16/2023 1141   MONOABS 0.4 08/27/2014 1145   EOSABS 0.3 02/16/2023 1141   EOSABS 0.0 08/27/2014 1145   BASOSABS 0.0 02/16/2023 1141   BASOSABS 0.0 08/27/2014 1145    BMET    Component Value Date/Time   NA 139 02/19/2023 0421   NA 140 02/12/2015 1137   K 3.8 02/19/2023 0421   K 2.9 (L) 02/12/2015 1137   CL 101 02/19/2023 0421   CL 107 02/12/2015 1137   CO2 27  02/19/2023 0421   CO2 27 02/12/2015 1137   GLUCOSE 151 (H) 02/19/2023 0421   GLUCOSE 120 (H) 02/12/2015 1137   BUN 9 02/19/2023 0421   BUN 15 02/12/2015 1137   CREATININE 0.68 02/19/2023 0421   CREATININE 0.78 02/12/2015 1137   CALCIUM 9.2 02/19/2023 0421   CALCIUM 9.0 02/12/2015 1137   GFRNONAA >60 02/19/2023 0421   GFRNONAA >60 02/12/2015 1137   GFRAA >60 11/23/2019 1330   GFRAA >60 02/12/2015 1137    INR    Component Value Date/Time   INR 1.2 02/15/2023 1136   INR 0.8 07/23/2012 0433     Intake/Output Summary (Last 24 hours) at 02/20/2023 1606 Last data filed at 02/20/2023 1141 Gross per 24 hour  Intake 243 ml  Output 2300 ml  Net -2057 ml     Assessment/Plan:  62 y.o. female is s/p axillary to femoral bypass graft back in 07/23/2022. On 01/17/2023 patient underwent left lower extremity angiogram with angioplasty and stent placement to restore blood flow. She is now less than 1 month since his procedure and she has occluded again. This occlusion occurs while she has been on daily aspirin 81 mg and Eliquis 5 mg twice daily.  5 Days Post-Op   PLAN   Left Lower Above the Knee Amputation schedule for tomorrow 02/21/2023.   DVT prophylaxis:  Heparin Infusion    Carla [name] [facility] 02/20/2023 4:06 PM   

## 2023-02-20 NOTE — Progress Notes (Signed)
ANTICOAGULATION CONSULT NOTE   Pharmacy Consult for IV heparin Indication: STEMI  Allergies  Allergen Reactions   Gabapentin     Tremors    Bactrim [Sulfamethoxazole-Trimethoprim] Itching    Patient Measurements: Height: 5\' 2"  (157.5 cm) Weight: 102.9 kg (226 lb 13.7 oz) IBW/kg (Calculated) : 50.1 Heparin Dosing Weight: 73.1 kg  Vital Signs: Temp: 98.2 F (36.8 C) (04/30 1636) BP: 110/67 (04/30 1638) Pulse Rate: 70 (04/30 1638)  Labs: Recent Labs    02/18/23 0542 02/19/23 0421 02/19/23 1147 02/19/23 1902 02/20/23 0434 02/20/23 1814  HGB  --  10.2*  --   --  10.0*  --   HCT  --  34.1*  --   --  32.3*  --   PLT  --  250  --   --  254  --   APTT 66* 58*   < > 67* 79* 79*  HEPARINUNFRC 0.82* 0.71*  --   --  0.83*  --   CREATININE  --  0.68  --   --   --   --    < > = values in this interval not displayed.     Estimated Creatinine Clearance: 80.9 mL/min (by C-G formula based on SCr of 0.68 mg/dL).   Medical History: Past Medical History:  Diagnosis Date   Abdominal aortic atherosclerosis (HCC)    a. 05/2017 CTA abd/pelvis: significant atherosclerotic dzs of the infrarenal abd Ao w/ some mural thrombus. No aneurysm or dissection.   Acute focal ischemia of small intestine (HCC)    Acute right-sided low back pain with right-sided sciatica 06/13/2017   AKI (acute kidney injury) (HCC) 07/29/2017   Anemia    Anginal pain (HCC)    a. 08/2012 Lexiscan MV: EF 54%, non ischemia/infarct.   Anxiety and depression    Baker's cyst of knee, right    a. 07/2016 U/S: 4.1 x 1.4 x 2.9 cystic structure in R poplitetal fossa.   Bell's palsy    CAD (coronary artery disease)    CHF (congestive heart failure) (HCC)    a.) TTE 06/20/2017: EF 60-65%, no rwma, G1DD, no source of cardiac emboli.   Chronic anticoagulation    a.) apixaban   Chronic idiopathic constipation    Chronic mesenteric ischemia (HCC)    Chronic pain    COPD (chronic obstructive pulmonary disease) (HCC)     Dyspnea    Embolus of superior mesenteric artery (HCC)    a. 05/2017 CTA Abd/pelvis: apparent thrombus or embolus in prox SMA (70-90%); b. 05/2017 catheter directed tPA, mechanical thrombectomy, and stenting of the SMA.   Essential hypertension with goal blood pressure less than 140/90 01/19/2016   Gastroesophageal reflux disease 01/19/2016   GERD (gastroesophageal reflux disease)    H/O colectomy    History of 2019 novel coronavirus disease (COVID-19) 11/14/2019   History of kidney stones    Hyperlipidemia    Hypertension    Morbid obesity with BMI of 40.0-44.9, adult (HCC)    NSTEMI (non-ST elevated myocardial infarction) (HCC) 06/27/2021   a.) high sensitivity troponins trended: 893 --> 1587 --> 1748 --> 868 --> 735 ng/L   Occlusive mesenteric ischemia (HCC) 06/19/2017   Osteoarthritis    PAD (peripheral artery disease) (HCC)    PAF (paroxysmal atrial fibrillation) (HCC)    a.) CHA2DS2-VASc = 6 (sex, CHF, HTN, DVT x2, aortic plaque). b.) rate/rhythm maintained on oral metoprolol tartrate; chronically anticoagulated with standard dose apixaban.   Palpitations    Peripheral edema  Personal history of other malignant neoplasm of skin 06/01/2016   Pneumonia    Pre-diabetes    Primary osteoarthritis of both knees 06/13/2017   SBO (small bowel obstruction) (HCC) 08/06/2017   Skin cancer of nose    Superior mesenteric artery thrombosis (HCC)    Vitamin D deficiency     Medications:  Eliquis 5 mg BID  Assessment: 62 year old female admitted with STEMI now s/p cath. PMH includes PAD s/p right AKA, CAD, HFpEF, HTN, HLD, atrial fibrillation on Eliquis prior to admission. Pharmacy has been consulted to start heparin 2 hours after radial band removal.  4/26 @ 0400:  aPTT = 37,  HL = > 1.10 rate inc to 1050 u/hr 4/26 1149 aPTT=56  subtherapeutic,  1050>> 4/26  2012 aPTT 83 4/27  0159:  aPTT = 76,   HL = > 1.10  4/28  0542:  aPTT = 66,   HL = 0.82 4/29  0421:  aPTT = 58,   HL =  0.71 4/29  1147:  aPTT = 59  inc to 1500 units/hr. 4/29  1902:  aPTT = 67  slightly therapeutic , slightly inc to 1550 units/hr  4/30 0434 aPTT 79 sec, therapeutic x 1, HL 0.83 not correlating 4/30 1814 aPTT 79 sec, therapeutic x 2  Goal of Therapy:  HL :  0.3 - 0.7 aPTT 66-85 seconds  Monitor platelets by anticoagulation protocol: Yes   Plan:  - aPTT therapeutic  - Will continue drip rate at 1550 units/hr. -Will ensure heparin stopped 5/01 at 0430 before AKA CBC daily  Otelia Sergeant, PharmD, [facility] 02/20/2023 7:06 PM

## 2023-02-20 NOTE — Progress Notes (Signed)
Progress Note    02/20/2023 4:06 PM 5 Days Post-Op  Subjective: Carla [name]. Carla [name] is a 62 year old female who is well-known to vascular surgery.  She still continues to have pain at rest in her left lower leg due to claudication and ischemia.  Denies any chest pain or shortness of breath today.  She denies any fevers or chills.  There is no open wounds to her left lower extremity.  Is all remained stable.   Vitals:   02/19/23 2254 02/19/23 2344  BP: 108/73 128/61  Pulse: 99 82  Resp: 20 20  Temp: 99 F (37.2 C) 98.5 F (36.9 C)  SpO2:  93%   Physical Exam: Cardiac:  AFIB, controlled rate, No murmurs  Lungs:  Clear on auscultation throughout. Without rales rhonchi or wheezing. Nonlabored breathing.  Incisions:  NONE  Extremities:  Rt AKA, Left lower extremity without pulses femoral, popliteal, Post tibial and dorsalis pedis.  Abdomen:  Positive bowel sounds with positive hernia, soft, NT/ND, no masses  Neurologic:  A&O X 3; No focal weakness or paresthesias are detected; speech is fluent/normal   CBC    Component Value Date/Time   WBC 7.1 02/20/2023 0434   RBC 3.62 (L) 02/20/2023 0434   HGB 10.0 (L) 02/20/2023 0434   HGB 13.4 02/12/2015 1137   HCT 32.3 (L) 02/20/2023 0434   HCT 40.9 02/12/2015 1137   PLT 254 02/20/2023 0434   PLT 272 02/12/2015 1137   MCV 89.2 02/20/2023 0434   MCV 90 02/12/2015 1137   MCH 27.6 02/20/2023 0434   MCHC 31.0 02/20/2023 0434   RDW 16.9 (H) 02/20/2023 0434   RDW 14.8 (H) 02/12/2015 1137   LYMPHSABS 1.6 02/16/2023 1141   LYMPHSABS 1.7 08/27/2014 1145   MONOABS 0.4 02/16/2023 1141   MONOABS 0.4 08/27/2014 1145   EOSABS 0.3 02/16/2023 1141   EOSABS 0.0 08/27/2014 1145   BASOSABS 0.0 02/16/2023 1141   BASOSABS 0.0 08/27/2014 1145    BMET    Component Value Date/Time   NA 139 02/19/2023 0421   NA 140 02/12/2015 1137   K 3.8 02/19/2023 0421   K 2.9 (L) 02/12/2015 1137   CL 101 02/19/2023 0421   CL 107 02/12/2015 1137   CO2 27  02/19/2023 0421   CO2 27 02/12/2015 1137   GLUCOSE 151 (H) 02/19/2023 0421   GLUCOSE 120 (H) 02/12/2015 1137   BUN 9 02/19/2023 0421   BUN 15 02/12/2015 1137   CREATININE 0.68 02/19/2023 0421   CREATININE 0.78 02/12/2015 1137   CALCIUM 9.2 02/19/2023 0421   CALCIUM 9.0 02/12/2015 1137   GFRNONAA >60 02/19/2023 0421   GFRNONAA >60 02/12/2015 1137   GFRAA >60 11/23/2019 1330   GFRAA >60 02/12/2015 1137    INR    Component Value Date/Time   INR 1.2 02/15/2023 1136   INR 0.8 07/23/2012 0433     Intake/Output Summary (Last 24 hours) at 02/20/2023 1606 Last data filed at 02/20/2023 1141 Gross per 24 hour  Intake 243 ml  Output 2300 ml  Net -2057 ml     Assessment/Plan:  62 y.o. female is s/p axillary to femoral bypass graft back in 07/23/2022. On 01/17/2023 patient underwent left lower extremity angiogram with angioplasty and stent placement to restore blood flow. She is now less than 1 month since his procedure and she has occluded again. This occlusion occurs while she has been on daily aspirin 81 mg and Eliquis 5 mg twice daily.  5 Days Post-Op   PLAN  Left Lower Above the Knee Amputation schedule for tomorrow 02/21/2023.   DVT prophylaxis:  Heparin Infusion    [name] 02/20/2023 4:06 PM

## 2023-02-21 ENCOUNTER — Inpatient Hospital Stay: Payer: 59 | Admitting: Certified Registered"

## 2023-02-21 ENCOUNTER — Other Ambulatory Visit: Payer: Self-pay

## 2023-02-21 ENCOUNTER — Encounter
Admission: EM | Disposition: A | Discharge status: Home Health | Payer: Self-pay | Source: Home / Self Care | Attending: Internal Medicine

## 2023-02-21 DIAGNOSIS — I5032 Chronic diastolic (congestive) heart failure: Secondary | ICD-10-CM | POA: Diagnosis not present

## 2023-02-21 DIAGNOSIS — I480 Paroxysmal atrial fibrillation: Secondary | ICD-10-CM | POA: Diagnosis not present

## 2023-02-21 DIAGNOSIS — I70222 Atherosclerosis of native arteries of extremities with rest pain, left leg: Principal | ICD-10-CM

## 2023-02-21 DIAGNOSIS — G8929 Other chronic pain: Secondary | ICD-10-CM | POA: Diagnosis not present

## 2023-02-21 DIAGNOSIS — I998 Other disorder of circulatory system: Secondary | ICD-10-CM | POA: Diagnosis not present

## 2023-02-21 HISTORY — PX: AMPUTATION: SHX166

## 2023-02-21 SURGERY — AMPUTATION, ABOVE KNEE
Anesthesia: General | Site: Knee | Laterality: Left

## 2023-02-21 MED ORDER — FENTANYL CITRATE (PF) 100 MCG/2ML IJ SOLN
25.0000 ug | INTRAMUSCULAR | Status: AC | PRN
  Administered 2023-02-21 (×6): 25 ug via INTRAVENOUS

## 2023-02-21 MED ORDER — CEFAZOLIN SODIUM-DEXTROSE 2-4 GM/100ML-% IV SOLN
2.0000 g | INTRAVENOUS | Status: AC
  Administered 2023-02-21: 2 g via INTRAVENOUS

## 2023-02-21 MED ORDER — ONDANSETRON HCL 4 MG/2ML IJ SOLN
INTRAMUSCULAR | Status: AC
  Filled 2023-02-21: qty 2

## 2023-02-21 MED ORDER — MIDAZOLAM HCL 2 MG/2ML IJ SOLN
INTRAMUSCULAR | Status: AC
  Filled 2023-02-21: qty 2

## 2023-02-21 MED ORDER — GLYCOPYRROLATE 0.2 MG/ML IJ SOLN
INTRAMUSCULAR | Status: DC | PRN
  Administered 2023-02-21: .2 mg via INTRAVENOUS

## 2023-02-21 MED ORDER — DIPHENHYDRAMINE HCL 50 MG/ML IJ SOLN: 50.0000 mg | Freq: Once | INTRAMUSCULAR | Status: DC | PRN

## 2023-02-21 MED ORDER — DEXAMETHASONE SODIUM PHOSPHATE 10 MG/ML IJ SOLN
INTRAMUSCULAR | Status: AC
  Filled 2023-02-21: qty 1

## 2023-02-21 MED ORDER — FAMOTIDINE 20 MG PO TABS: 40.0000 mg | ORAL_TABLET | Freq: Once | ORAL | Status: DC | PRN

## 2023-02-21 MED ORDER — GLYCOPYRROLATE 0.2 MG/ML IJ SOLN
INTRAMUSCULAR | Status: AC
  Filled 2023-02-21: qty 1

## 2023-02-21 MED ORDER — FENTANYL CITRATE (PF) 100 MCG/2ML IJ SOLN
INTRAMUSCULAR | Status: DC | PRN
  Administered 2023-02-21: 100 ug via INTRAVENOUS
  Administered 2023-02-21 (×2): 50 ug via INTRAVENOUS

## 2023-02-21 MED ORDER — PROPOFOL 10 MG/ML IV BOLUS
INTRAVENOUS | Status: DC | PRN
  Administered 2023-02-21: 50 mg via INTRAVENOUS
  Administered 2023-02-21: 100 mg via INTRAVENOUS

## 2023-02-21 MED ORDER — SUCCINYLCHOLINE CHLORIDE 200 MG/10ML IV SOSY
PREFILLED_SYRINGE | INTRAVENOUS | Status: DC | PRN
  Administered 2023-02-21: 100 mg via INTRAVENOUS

## 2023-02-21 MED ORDER — PROPOFOL 10 MG/ML IV BOLUS
INTRAVENOUS | Status: AC
  Filled 2023-02-21: qty 20

## 2023-02-21 MED ORDER — KETAMINE HCL 50 MG/ML IJ SOLN
INTRAMUSCULAR | Status: DC | PRN
  Administered 2023-02-21 (×2): 25 mg via INTRAVENOUS

## 2023-02-21 MED ORDER — NYSTATIN 100000 UNIT/GM EX POWD
Freq: Two times a day (BID) | CUTANEOUS | Status: DC
  Filled 2023-02-21: qty 15

## 2023-02-21 MED ORDER — FENTANYL CITRATE PF 50 MCG/ML IJ SOSY
12.5000 ug | PREFILLED_SYRINGE | Freq: Once | INTRAMUSCULAR | Status: AC | PRN
  Administered 2023-02-23: 12.5 ug via INTRAVENOUS
  Filled 2023-02-21: qty 1

## 2023-02-21 MED ORDER — SODIUM CHLORIDE 0.9 % IV SOLN: INTRAVENOUS | Status: DC

## 2023-02-21 MED ORDER — HYDROMORPHONE HCL 1 MG/ML IJ SOLN
INTRAMUSCULAR | Status: AC
  Filled 2023-02-21: qty 1

## 2023-02-21 MED ORDER — KETAMINE HCL 50 MG/5ML IJ SOSY
PREFILLED_SYRINGE | INTRAMUSCULAR | Status: AC
  Filled 2023-02-21: qty 5

## 2023-02-21 MED ORDER — LIDOCAINE HCL (PF) 2 % IJ SOLN
INTRAMUSCULAR | Status: AC
  Filled 2023-02-21: qty 5

## 2023-02-21 MED ORDER — ACETAMINOPHEN 10 MG/ML IV SOLN
INTRAVENOUS | Status: AC
  Filled 2023-02-21: qty 100

## 2023-02-21 MED ORDER — HYDROMORPHONE HCL 1 MG/ML IJ SOLN
1.0000 mg | Freq: Once | INTRAMUSCULAR | Status: AC | PRN
  Administered 2023-02-21: 1 mg via INTRAVENOUS

## 2023-02-21 MED ORDER — LIDOCAINE HCL (CARDIAC) PF 100 MG/5ML IV SOSY
PREFILLED_SYRINGE | INTRAVENOUS | Status: DC | PRN
  Administered 2023-02-21: 50 mg via INTRAVENOUS

## 2023-02-21 MED ORDER — FENTANYL CITRATE (PF) 100 MCG/2ML IJ SOLN
INTRAMUSCULAR | Status: AC
  Filled 2023-02-21: qty 2

## 2023-02-21 MED ORDER — ACETAMINOPHEN 10 MG/ML IV SOLN
INTRAVENOUS | Status: DC | PRN
  Administered 2023-02-21: 1000 mg via INTRAVENOUS

## 2023-02-21 MED ORDER — 0.9 % SODIUM CHLORIDE (POUR BTL) OPTIME
TOPICAL | Status: DC | PRN
  Administered 2023-02-21: 1000 mL

## 2023-02-21 MED ORDER — ONDANSETRON HCL 4 MG/2ML IJ SOLN: 4.0000 mg | Freq: Four times a day (QID) | INTRAMUSCULAR | Status: DC | PRN

## 2023-02-21 MED ORDER — CEFAZOLIN SODIUM-DEXTROSE 2-4 GM/100ML-% IV SOLN
INTRAVENOUS | Status: AC
  Filled 2023-02-21: qty 100

## 2023-02-21 MED ORDER — SUCCINYLCHOLINE CHLORIDE 200 MG/10ML IV SOSY
PREFILLED_SYRINGE | INTRAVENOUS | Status: AC
  Filled 2023-02-21: qty 10

## 2023-02-21 MED ORDER — METHYLPREDNISOLONE SODIUM SUCC 125 MG IJ SOLR: 125.0000 mg | Freq: Once | INTRAMUSCULAR | Status: DC | PRN

## 2023-02-21 MED ORDER — DEXMEDETOMIDINE HCL IN NACL 80 MCG/20ML IV SOLN
INTRAVENOUS | Status: AC
  Filled 2023-02-21: qty 20

## 2023-02-21 MED ORDER — DEXAMETHASONE SODIUM PHOSPHATE 10 MG/ML IJ SOLN
INTRAMUSCULAR | Status: DC | PRN
  Administered 2023-02-21: 10 mg via INTRAVENOUS

## 2023-02-21 MED ORDER — CHLORHEXIDINE GLUCONATE 4 % EX SOLN: 60.0000 mL | Freq: Once | CUTANEOUS | Status: DC

## 2023-02-21 MED ORDER — MIDAZOLAM HCL 2 MG/ML PO SYRP: 8.0000 mg | ORAL_SOLUTION | Freq: Once | ORAL | Status: DC | PRN

## 2023-02-21 MED ORDER — DEXMEDETOMIDINE HCL IN NACL 80 MCG/20ML IV SOLN
INTRAVENOUS | Status: DC | PRN
  Administered 2023-02-21: 12 ug via INTRAVENOUS

## 2023-02-21 MED ORDER — APIXABAN 5 MG PO TABS
5.0000 mg | ORAL_TABLET | Freq: Two times a day (BID) | ORAL | Status: DC
  Administered 2023-02-21 – 2023-02-24 (×6): 5 mg via ORAL
  Filled 2023-02-21 (×6): qty 1

## 2023-02-21 MED ORDER — ONDANSETRON HCL 4 MG/2ML IJ SOLN
INTRAMUSCULAR | Status: DC | PRN
  Administered 2023-02-21: 4 mg via INTRAVENOUS

## 2023-02-21 MED ORDER — MIDAZOLAM HCL 2 MG/2ML IJ SOLN
INTRAMUSCULAR | Status: DC | PRN
  Administered 2023-02-21: 1 mg via INTRAVENOUS

## 2023-02-21 SURGICAL SUPPLY — 42 items
APL PRP STRL LF DISP 70% ISPRP (MISCELLANEOUS) ×1
BLADE SAGITTAL WIDE XTHICK NO (BLADE) ×1 IMPLANT
BNDG CMPR 5X4 CHSV STRCH STRL (GAUZE/BANDAGES/DRESSINGS) ×2
BNDG CMPR STD VLCR NS LF 5.8X6 (GAUZE/BANDAGES/DRESSINGS) ×2
BNDG COHESIVE 4X5 TAN STRL LF (GAUZE/BANDAGES/DRESSINGS) ×1 IMPLANT
BNDG ELASTIC 6X5.8 VLCR NS LF (GAUZE/BANDAGES/DRESSINGS) ×1 IMPLANT
BNDG GAUZE DERMACEA FLUFF 4 (GAUZE/BANDAGES/DRESSINGS) ×2 IMPLANT
BNDG GZE DERMACEA 4 6PLY (GAUZE/BANDAGES/DRESSINGS) ×2
BRUSH SCRUB EZ  4% CHG (MISCELLANEOUS) ×1
BRUSH SCRUB EZ 4% CHG (MISCELLANEOUS) ×1 IMPLANT
CHLORAPREP W/TINT 26 (MISCELLANEOUS) ×1 IMPLANT
DRAPE INCISE IOBAN 66X45 STRL (DRAPES) ×1 IMPLANT
DRAPE INCISE IOBAN 66X60 STRL (DRAPES) ×1 IMPLANT
ELECT CAUTERY BLADE 6.4 (BLADE) ×1 IMPLANT
ELECT REM PT RETURN 9FT ADLT (ELECTROSURGICAL) ×1
ELECTRODE REM PT RTRN 9FT ADLT (ELECTROSURGICAL) ×1 IMPLANT
GAUZE XEROFORM 1X8 LF (GAUZE/BANDAGES/DRESSINGS) ×2 IMPLANT
GLOVE BIO SURGEON STRL SZ7 (GLOVE) ×2 IMPLANT
GOWN STRL REUS W/ TWL LRG LVL3 (GOWN DISPOSABLE) ×2 IMPLANT
GOWN STRL REUS W/ TWL XL LVL3 (GOWN DISPOSABLE) ×1 IMPLANT
GOWN STRL REUS W/TWL LRG LVL3 (GOWN DISPOSABLE) ×3
GOWN STRL REUS W/TWL XL LVL3 (GOWN DISPOSABLE) ×1
HANDLE YANKAUER SUCT BULB TIP (MISCELLANEOUS) ×1 IMPLANT
KIT TURNOVER KIT A (KITS) ×1 IMPLANT
LABEL OR SOLS (LABEL) ×1 IMPLANT
MANIFOLD NEPTUNE II (INSTRUMENTS) ×1 IMPLANT
MAT ABSORB  FLUID 56X50 GRAY (MISCELLANEOUS) ×2
MAT ABSORB FLUID 56X50 GRAY (MISCELLANEOUS) ×1 IMPLANT
NS IRRIG 1000ML POUR BTL (IV SOLUTION) ×1 IMPLANT
PACK EXTREMITY ARMC (MISCELLANEOUS) ×1 IMPLANT
PAD ABD DERMACEA PRESS 5X9 (GAUZE/BANDAGES/DRESSINGS) ×2 IMPLANT
PAD PREP OB/GYN DISP 24X41 (PERSONAL CARE ITEMS) ×1 IMPLANT
SPONGE T-LAP 18X18 ~~LOC~~+RFID (SPONGE) ×2 IMPLANT
STAPLER SKIN PROX 35W (STAPLE) ×1 IMPLANT
STOCKINETTE M/LG 89821 (MISCELLANEOUS) ×1 IMPLANT
SUT SILK 2 0 (SUTURE) ×1
SUT SILK 2 0 SH (SUTURE) ×2 IMPLANT
SUT SILK 2-0 18XBRD TIE 12 (SUTURE) ×1 IMPLANT
SUT VIC AB 0 CT1 36 (SUTURE) ×2 IMPLANT
SUT VIC AB 2-0 CT1 (SUTURE) ×2 IMPLANT
TRAP FLUID SMOKE EVACUATOR (MISCELLANEOUS) ×1 IMPLANT
WATER STERILE IRR 500ML POUR (IV SOLUTION) ×1 IMPLANT

## 2023-02-21 NOTE — Anesthesia Procedure Notes (Signed)
Procedure Name: Intubation Date/Time: 02/21/2023 10:54 AM  Performed by: Mathews Argyle, CRNAPre-anesthesia Checklist: Patient identified, Patient being monitored, Timeout performed, Emergency Drugs available and Suction available Patient Re-evaluated:Patient Re-evaluated prior to induction Oxygen Delivery Method: Circle system utilized Preoxygenation: Pre-oxygenation with 100% oxygen Induction Type: IV induction and Rapid sequence Laryngoscope Size: 3 and McGraph Grade View: Grade I Tube type: Oral Tube size: 7.0 mm Number of attempts: 1 Airway Equipment and Method: Stylet and Video-laryngoscopy Placement Confirmation: ETT inserted through vocal cords under direct vision, positive ETCO2 and breath sounds checked- equal and bilateral Secured at: 20 cm Tube secured with: Tape Dental Injury: Teeth and Oropharynx as per pre-operative assessment

## 2023-02-21 NOTE — Consult Note (Addendum)
WOC Nurse wound follow up; R AKA w/revision and NPWT placement 12/07/2022 Wound type: surgical, full thickness  Measurement:2.5 cms x 4.2 cms x 0.2 cms; did note separation at distal aspect of wound with 0. 8 cm depth of this area  (5-7 o'clock)  Wound bed:90% pink and moist 10% tan devitalized tissue at distal aspect  Drainage (amount, consistency, odor) minimal serosanguinous  Periwound:intact  Dressing procedure/placement/frequency: After discussing with Pace, NP Removed NPWT at this time.  Will change wound care to silver hydrofiber daily as follows:  Clean R stump wound with NS, apply Silver Hydrofiber [place] [phone]) cut to fit wound bed daily.  Using a Q tip applicator make sure to fill in bottom of wound with Silver.  Cover silver with foam dressing. May lift foam dressing daily to replace silver.  IF SILVER STUCK TO WOUND BED MOISTEN WITH NS BEFORE REMOVING.  Change foam dressing q3 days and prn soiling Patient requires pre medication before any wound care.  Patient screaming out in pain in spite of receiving po and IV pain medication prior to dressing change.  Patient screaming in pain and crying with even light touch.    POC discussed with patient, bedside nurse and vascular surgery NP.  Did secure chat Pace NP regarding new opening at distal aspect of wound.   WOC team will not follow patient at this time  Re-consult if further needs arise.   Thank you,     Priscella Mann MSN, RN-BC, [employer] [phone]

## 2023-02-21 NOTE — Telephone Encounter (Signed)
Error

## 2023-02-21 NOTE — Anesthesia Postprocedure Evaluation (Signed)
Anesthesia Post Note  Patient: Carla [name]  Procedure(s) Performed: AMPUTATION ABOVE KNEE (Left: Knee)  Patient location during evaluation: PACU Anesthesia Type: General Level of consciousness: awake and alert Pain management: pain level controlled Vital Signs Assessment: post-procedure vital signs reviewed and stable Respiratory status: spontaneous breathing, nonlabored ventilation, respiratory function stable and patient connected to nasal cannula oxygen Cardiovascular status: blood pressure returned to baseline and stable Postop Assessment: no apparent nausea or vomiting Anesthetic complications: no  No notable events documented.   Last Vitals:  Vitals:   02/21/23 1310 02/21/23 1335  BP: (!) 155/96 (!) 148/97  Pulse: (!) 114 (!) 118  Resp: 10 18  Temp:  37.3 C  SpO2: 94% 96%    Last Pain:  Vitals:   02/21/23 1305  TempSrc:   PainSc: Asleep                 Stephanie Coup

## 2023-02-21 NOTE — Interval H&P Note (Signed)
History and Physical Interval Note:  02/21/2023 10:19 AM  Carla [name]  has presented today for surgery, with the diagnosis of PAD.  The various methods of treatment have been discussed with the patient and family. After consideration of risks, benefits and other options for treatment, the patient has consented to  Procedure(s): AMPUTATION ABOVE KNEE (Left) as a surgical intervention.  The patient's history has been reviewed, patient examined, no change in status, stable for surgery.  I have reviewed the patient's chart and labs.  Questions were answered to the patient's satisfaction.     Festus Barren

## 2023-02-21 NOTE — Progress Notes (Signed)
[facility]  - [place] [facility]   PATIENT NAME: Carla [name]    MR#:  [id number]  DATE OF BIRTH:  May 06, 1961  SUBJECTIVE:  no family at bedside. Patient just got back from left a.k.a. Seen earlier. Appears sedated from surgery.    VITALS:  Blood pressure (!) 148/97, pulse (!) 118, temperature 99.2 F (37.3 C), resp. rate 18, height 5\' 2"  (1.575 m), weight 102.9 kg, SpO2 96 %.  PHYSICAL EXAMINATION:   GENERAL:  62 y.o.-year-old patient with no acute distress.  LUNGS: Normal breath sounds bilaterally, no wheezing CARDIOVASCULAR: S1, S2 normal. No murmur   ABDOMEN: Soft, nontender, nondistended. Bowel sounds present.  EXTREMITIES: left AKA stump dressing + NEUROLOGIC: nonfocal  patient is  sedated   LABORATORY PANEL:  CBC Recent Labs  Lab 02/20/23 0434  WBC 7.1  HGB 10.0*  HCT 32.3*  PLT 254    Chemistries  Recent Labs  Lab 02/19/23 0421  NA 139  K 3.8  CL 101  CO2 27  GLUCOSE 151*  BUN 9  CREATININE 0.68  CALCIUM 9.2   Assessment and Plan 61 y.o. female with medical history significant of severe PAD s/p right AKA, CAD, HFpEF, hypertension, hyperlipidemia, paroxysmal atrial fibrillation on Eliquis, SMA thrombosis, who presents to the ED due to leg pain.      Cardiac catheterization showed a proximal RCA lesion 30% stenosed and a first diagonal lesion 50% stenosis, EF 65% by visual estimate. ST elevation believed to be secondary to coronary spasm and Imdur was increased.   5/1: patient just got left above knee amputation due to severe ischemia. Appears sedated from anesthesia. No family in the room. Okay with Dr. due to start eliquis. Discontinue heparin drip.  Acute lower limb ischemia Extensive history of PAD, with prior right AKA (June 2023) with recent stump revision (February 2024), and left femoral bypass mechanical thrombectomy (March 2024). Patient is presenting with 1 week of left lower extremity. - Oral (increased to oxycodone 15 mg  every 4 hours as needed on 4/28) and IV pain medications -s/p  left AKA  per Dr Wyn Quaker (02/21/23)   ST elevation Patient presented with chest pain has been intermittent, in addition to ST elevation in the lateral leads.   -Left heart cath demonstrated RCA spasm, resolved with nitroglycerin.  No stenting required. - Imdur increased to 120 mg daily - Continue Cardizem if blood pressure allows.   Coronary artery disease involving native coronary artery of native heart - Nonobstructive CAD, continue aspirin, Imdur   Paroxysmal atrial fibrillation (HCC) - Cardizem if blood pressure allows. - Anticoagulation with heparin--ok to restart eliquis   Chronic diastolic CHF (congestive heart failure) (HCC) Patient appears euvolemic at this time.    COPD (chronic obstructive pulmonary disease) (HCC) Continue home bronchodilators with DuoNebs as needed.   Chronic pain Increased oxycodone on 4/28 and continue with Dilaudid for breakthrough pain given acute ischemia   Obesity, Class III, BMI 40-49.9 (morbid obesity) (HCC) BMI 41.49 with current height and weight.   Constipation -- use bowel prep/regimen    Procedures: Family communication :none today Consults :Vascular, cardiology CODE STATUS:FULL  DVT Prophylaxis :eliquis Level of care: Med-Surg Status is: Inpatient Remains inpatient appropriate because: POD 0 left AKA    TOTAL TIME TAKING CARE OF THIS PATIENT: 35 minutes.  >50% time spent on counselling and coordination of care  Note: This dictation was prepared with Dragon dictation along with smaller phrase technology. Any transcriptional errors that result from this  process are unintentional.  [name] M.D    [facility]   CC: Primary care physician; Terri Piedra, PA-C

## 2023-02-21 NOTE — Op Note (Signed)
  [facility]   OPERATIVE NOTE   PROCEDURE:  Left above-the-knee amputation  PRE-OPERATIVE DIAGNOSIS: Left leg irreversible ischemia with rest pain  POST-OPERATIVE DIAGNOSIS: same as above  SURGEON:  Festus Barren, MD  ASSISTANT(S): Rolla Plate  ANESTHESIA: general  ESTIMATED BLOOD LOSS: 50 cc  FINDING(S): none  SPECIMEN(S):  Left above-the-knee amputation  INDICATIONS:   Carla [name] is a 62 y.o. female who presents with left leg irreversible ischemia with rest pain.  The patient is scheduled for a left above-the-knee amputation.  I discussed in depth with the patient the risks, benefits, and alternatives to this procedure.  The patient is aware that the risk of this operation included but are not limited to:  bleeding, infection, myocardial infarction, stroke, death, failure to heal amputation wound, and possible need for more proximal amputation.  The patient is aware of the risks and agrees proceed forward with the procedure. An assistant was present during the procedure to help facilitate the exposure and expedite the procedure.   DESCRIPTION: After full informed written consent was obtained from the patient, the patient was taken to the operating room, and placed supine upon the operating table.  Prior to induction, the patient received IV antibiotics.  The patient was then prepped and draped in the standard fashion for a left above-the-knee amputation.  The assistant provided retraction and mobilization to help facilitate exposure and expedite the procedure throughout the entire procedure.  This included following suture, using retractors, and optimizing lighting. After obtaining adequate anesthesia, the patient was prepped and draped in the standard fashion for a above-the-knee amputation.  I marked out the anterior and posterior flaps for a fish-mouth type of amputation.  I made the incisions for these flaps, and then dissected through the subcutaneous  tissue, fascia, and muscles circumferentially.  I elevated  the periosteal tissue 4-5 cm more proximal than the anterior skin flap.  I then transected the femur with a power saw at this level.  Then I smoothed out the rough edges of the bone.  At this point, the specimen was passed off the field as the above-the-knee amputation.  At this point, I clamped all visibly bleeding arteries and veins using a combination of suture ligation with silk suture and electrocautery.   Bleeding continued to be controlled with electrocautery and suture ligature.  The stump was washed off with sterile normal saline and no further active bleeding was noted.  I reapproximated the anterior and posterior fascia  with interrupted stitches of 0 Vicryl.  This was completed along the entire length of anterior and posterior fascia until there were no more loose space in the fascial line. The subcutaneous tissue was then approximated with 2-0 vicryl sutures. The skin was then  reapproximated with staples.  The stump was washed off and dried.  The incision was dressed with Xeroform and ABD pads, and  then fluffs were applied.  Kerlix was wrapped around the leg and then gently an ACE wrap was applied.  A large Ioban was then placed over the ACE wrap to secure the dressing. The patient was then awakened and take to the recovery room in stable condition.   COMPLICATIONS: none  CONDITION: stable  Festus Barren  02/21/2023, 11:42 AM   This note was created with Dragon Medical transcription system. Any errors in dictation are purely unintentional.

## 2023-02-21 NOTE — Anesthesia Preprocedure Evaluation (Signed)
Anesthesia Evaluation  Patient identified by MRN, date of birth, ID band Patient confused  General Assessment Comment:Pt sedated on precedex. She follows commands in upper extremities. She has LUE pain.   Reviewed: Allergy & Precautions, NPO status , Patient's Chart, lab work & pertinent test results  Airway Mallampati: IV  TM Distance: >3 FB Neck ROM: full    Dental  (+) Edentulous Upper, Edentulous Lower, Dental Advidsory Given   Pulmonary shortness of breath, COPD,  COPD inhaler, former smoker    + decreased breath sounds      Cardiovascular Exercise Tolerance: Poor hypertension, Pt. on medications + angina  + CAD, + Past MI, + Peripheral Vascular Disease (previous left axillary to femoral artery bypass) and +CHF  Normal cardiovascular exam Rhythm:Regular     Neuro/Psych   Anxiety     negative neurological ROS  negative psych ROS   GI/Hepatic Neg liver ROS,GERD  Controlled,,  Endo/Other  negative endocrine ROS  Morbid obesity  Renal/GU Renal InsufficiencyRenal disease     Musculoskeletal   Abdominal  (+) + obese  Peds negative pediatric ROS (+)  Hematology  (+) Blood dyscrasia, anemia   Anesthesia Other Findings Recent right AKA with stump infection Presented with acute left leg iscemia s/p revascularization.  Past Medical History: No date: Abdominal aortic atherosclerosis (HCC)     Comment:  a. 05/2017 CTA abd/pelvis: significant atherosclerotic               dzs of the infrarenal abd Ao w/ some mural thrombus. No               aneurysm or dissection. No date: Acute focal ischemia of small intestine (HCC) 06/13/2017: Acute right-sided low back pain with right-sided sciatica 07/29/2017: AKI (acute kidney injury) (HCC) No date: Anemia No date: Anginal pain (HCC)     Comment:  a. 08/2012 Lexiscan MV: EF 54%, non ischemia/infarct. No date: Anxiety and depression No date: Baker's cyst of knee, right     Comment:   a. 07/2016 U/S: 4.1 x 1.4 x 2.9 cystic structure in R               poplitetal fossa. No date: Bell's palsy No date: CAD (coronary artery disease) No date: CHF (congestive heart failure) (HCC)     Comment:  a.) TTE 06/20/2017: EF 60-65%, no rwma, G1DD, no source               of cardiac emboli. No date: Chronic anticoagulation     Comment:  a.) apixaban No date: Chronic idiopathic constipation No date: Chronic mesenteric ischemia (HCC) No date: Chronic pain No date: COPD (chronic obstructive pulmonary disease) (HCC) No date: Dyspnea No date: Embolus of superior mesenteric artery (HCC)     Comment:  a. 05/2017 CTA Abd/pelvis: apparent thrombus or embolus               in prox SMA (70-90%); b. 05/2017 catheter directed tPA,               mechanical thrombectomy, and stenting of the SMA. 01/19/2016: Essential hypertension with goal blood pressure less than  140/90 01/19/2016: Gastroesophageal reflux disease No date: GERD (gastroesophageal reflux disease) No date: H/O colectomy 11/14/2019: History of 2019 novel coronavirus disease (COVID-19) No date: History of kidney stones No date: Hyperlipidemia No date: Hypertension No date: Morbid obesity with BMI of 40.0-44.9, adult (HCC) 06/27/2021: NSTEMI (non-ST elevated myocardial infarction) (HCC)     Comment:  a.) high sensitivity troponins trended:  893 --> 1587 -->              1748 --> 868 --> 735 ng/L 06/19/2017: Occlusive mesenteric ischemia (HCC) No date: Osteoarthritis No date: PAD (peripheral artery disease) (HCC) No date: PAF (paroxysmal atrial fibrillation) (HCC)     Comment:  a.) CHA2DS2-VASc = 6 (sex, CHF, HTN, DVT x2, aortic               plaque). b.) rate/rhythm maintained on oral metoprolol               tartrate; chronically anticoagulated with standard dose               apixaban. No date: Palpitations No date: Peripheral edema 06/01/2016: Personal history of other malignant neoplasm of skin No date: Pneumonia No date:  Pre-diabetes 06/13/2017: Primary osteoarthritis of both knees 08/06/2017: SBO (small bowel obstruction) (HCC) No date: Skin cancer of nose No date: Superior mesenteric artery thrombosis (HCC) No date: Vitamin D deficiency  Past Surgical History: 04/06/2022: AMPUTATION; Right     Comment:  Procedure: AMPUTATION ABOVE KNEE;  Surgeon: Annice Needy, MD;  Location: [facility];  Service: Vascular;                Laterality: Right; 12/09/2019: AMPUTATION TOE; Left     Comment:  Procedure: AMPUTATION TOE MPJ left;  Surgeon: Rosetta Posner, DPM;  Location: [facility];  Service: Podiatry;                Laterality: Left; No date: APPENDECTOMY 12/01/2021: APPLICATION OF WOUND VAC     Comment:  Procedure: APPLICATION OF WOUND VAC;  Surgeon: Annice Needy, MD;  Location: [facility];  Service: Vascular;; 05/03/2022: APPLICATION OF WOUND VAC; Right     Comment:  Procedure: APPLICATION OF WOUND VAC;  Surgeon: Annice Needy, MD;  Location: [facility];  Service: Vascular;                Laterality: Right; 07/23/2022: APPLICATION OF WOUND VAC; Left     Comment:  Procedure: APPLICATION OF WOUND VAC;  Surgeon: Nada Libman, MD;  Location: [facility];  Service: Vascular;                Laterality: Left;  Prevena 08/28/2022: APPLICATION OF WOUND VAC; Right     Comment:  Procedure: APPLICATION OF WOUND VAC;  Surgeon: Annice Needy, MD;  Location: [facility];  Service: Vascular;                Laterality: Right; 07/23/2022: AXILLARY-FEMORAL BYPASS GRAFT; Left     Comment:  Procedure: BYPASS GRAFT AXILLA-BIFEMORAL;  Surgeon:               Nada Libman, MD;  Location: [facility];  Service:               Vascular;  Laterality: Left; No date: CHOLECYSTECTOMY No date: COLON SURGERY 03/23/2022: COLONOSCOPY WITH PROPOFOL; N/A     Comment:  Procedure: COLONOSCOPY WITH  PROPOFOL;  Surgeon: Wyline Mood, MD;  Location: [facility];   Service:               Gastroenterology;  Laterality: N/A;  rectal bleed 06/21/2020: EMBOLECTOMY OR THROMBECTOMY, WITH OR WITHOUT CATHETER;  FEMOROPOPLITEAL, AORTOILIAC ARTERY, BY LEG INCISION; N/A     Comment:  Left - Decomp Fasciotomy Leg; Ant &/Or Lat Compart Only;              Midline - Angiography, Visceral, Selective Or               Supraselective (With Or Without Flush Aortogram); Left -               Revascularize, Endovasc, Open/Percut, Iliac Artery,               Unilat, Initial Vessel; W/Translum Stent, W/Angioplasty;               Right - Revascularize, Endovasc, Open/Percut, Iliac               Artery, Ea Add`L Ipsilateral; W/Translumin Stent,               W/Angioplasty; Location [facility]; 12/01/2021: ENDARTERECTOMY FEMORAL; Left     Comment:  Procedure: ENDARTERECTOMY FEMORAL;  Surgeon: Annice Needy, MD;  Location: [facility];  Service: Vascular;                Laterality: Left; 12/01/2021: ENDOVASCULAR REPAIR/STENT GRAFT; Left     Comment:  Procedure: ENDOVASCULAR REPAIR/STENT GRAFT;  Surgeon:               Annice Needy, MD;  Location: [facility];                Service: Cardiovascular;  Laterality: Left; 05/03/2022: INCISION AND DRAINAGE OF WOUND; Right     Comment:  Procedure: IRRIGATION AND DEBRIDEMENT RIGHT STUMP;                Surgeon: Annice Needy, MD;  Location: [facility];  Service:              Vascular;  Laterality: Right; 08/28/2022: IRRIGATION AND DEBRIDEMENT ABSCESS; Right     Comment:  Procedure: AKA IRRIGATION AND DEBRIDEMENT ABSCESS;                Surgeon: Annice Needy, MD;  Location: [facility];  Service:              Vascular;  Laterality: Right; 08/08/2017: LAPAROTOMY; N/A     Comment:  Procedure: EXPLORATORY LAPAROTOMY POSSIBLE BOWEL               RESECTION;  Surgeon: Leafy Ro, MD;  Location: [facility]               ORS;  Service: General;  Laterality: N/A; 05/17/2021: LEFT HEART CATH AND CORONARY ANGIOGRAPHY; N/A     Comment:   Procedure: LEFT HEART CATH AND CORONARY ANGIOGRAPHY;                Surgeon: Alwyn Pea, MD;  Location: [facility] INV[facility]              CV LAB;  Service: Cardiovascular;  Laterality: N/A; 11/17/2019: LOWER EXTREMITY ANGIOGRAPHY; Left     Comment:  Procedure: LOWER EXTREMITY ANGIOGRAPHY;  Surgeon: Annice Needy, MD;  Location: [facility];  Service:               Cardiovascular;  Laterality: Left; 12/13/2020: LOWER EXTREMITY ANGIOGRAPHY; Right     Comment:  Procedure: LOWER EXTREMITY ANGIOGRAPHY;  Surgeon: Annice Needy, MD;  Location: [facility];  Service:               Cardiovascular;  Laterality: Right; 01/10/2021: LOWER EXTREMITY ANGIOGRAPHY; Right     Comment:  Procedure: LOWER EXTREMITY ANGIOGRAPHY;  Surgeon: Annice Needy, MD;  Location: [facility];  Service:               Cardiovascular;  Laterality: Right; 01/24/2021: LOWER EXTREMITY ANGIOGRAPHY; Right     Comment:  Procedure: LOWER EXTREMITY ANGIOGRAPHY;  Surgeon: Annice Needy, MD;  Location: [facility];  Service:               Cardiovascular;  Laterality: Right; 06/28/2021: LOWER EXTREMITY ANGIOGRAPHY; Bilateral     Comment:  Procedure: Lower Extremity Angiography;  Surgeon: Annice Needy, MD;  Location: [facility];  Service:               Cardiovascular;  Laterality: Bilateral; 11/28/2021: LOWER EXTREMITY ANGIOGRAPHY; Right     Comment:  Procedure: LOWER EXTREMITY ANGIOGRAPHY;  Surgeon: Annice Needy, MD;  Location: [facility];  Service:               Cardiovascular;  Laterality: Right; 11/30/2021: LOWER EXTREMITY ANGIOGRAPHY; Left     Comment:  Procedure: Lower Extremity Angiography;  Surgeon: Annice Needy, MD;  Location: [facility];  Service:               Cardiovascular;  Laterality: Left; 02/22/2022: LOWER EXTREMITY ANGIOGRAPHY; Right     Comment:  Procedure: Lower  Extremity Angiography;  Surgeon: Annice Needy, MD;  Location: [facility];  Service:               Cardiovascular;  Laterality: Right; 03/13/2022: LOWER EXTREMITY ANGIOGRAPHY; Right     Comment:  Procedure: Lower Extremity Angiography;  Surgeon: Annice Needy, MD;  Location: [facility];  Service:               Cardiovascular;  Laterality: Right; 03/24/2022: LOWER EXTREMITY ANGIOGRAPHY; Right     Comment:  Procedure: Lower Extremity Angiography;  Surgeon: Annice Needy, MD;  Location: [facility];  Service:               Cardiovascular;  Laterality: Right; 11/19/2019: LOWER EXTREMITY INTERVENTION; N/A  Comment:  Procedure: LOWER EXTREMITY INTERVENTION;  Surgeon: Annice Needy, MD;  Location: [facility];  Service:               Cardiovascular;  Laterality: N/A; 06/29/2021: LOWER EXTREMITY INTERVENTION; Bilateral     Comment:  Procedure: LOWER EXTREMITY INTERVENTION;  Surgeon: Annice Needy, MD;  Location: [facility];  Service:               Cardiovascular;  Laterality: Bilateral; 06/22/2017: TEE WITHOUT CARDIOVERSION; N/A     Comment:  Procedure: TRANSESOPHAGEAL ECHOCARDIOGRAM (TEE);                Surgeon: Iran Ouch, MD;  Location: [facility];                Service: Cardiovascular;  Laterality: N/A; 07/11/2018: TOTAL KNEE ARTHROPLASTY; Right     Comment:  Procedure: TOTAL KNEE ARTHROPLASTY;  Surgeon: Christena Flake, MD;  Location: [facility];  Service: Orthopedics;                Laterality: Right; No date: VAGINAL HYSTERECTOMY 06/20/2017: VISCERAL ARTERY INTERVENTION; N/A     Comment:  Procedure: Visceral Artery Intervention, possible aortic              thrombectomy;  Surgeon: Annice Needy, MD;  Location: [facility]              INVASIVE CV LAB;  Service: Cardiovascular;  Laterality:               N/A; 01/28/2018: VISCERAL ARTERY INTERVENTION; N/A     Comment:   Procedure: VISCERAL ARTERY INTERVENTION;  Surgeon: Annice Needy, MD;  Location: [facility];  Service:               Cardiovascular;  Laterality: N/A;  BMI    Body Mass Index: 40.24 kg/m      Reproductive/Obstetrics negative OB ROS                             Anesthesia Physical Anesthesia Plan  ASA: 3  Anesthesia Plan: General ETT   Post-op Pain Management:    Induction: Intravenous  PONV Risk Score and Plan: Ondansetron, Propofol infusion and TIVA  Airway Management Planned: Oral ETT  Additional Equipment:   Intra-op Plan:   Post-operative Plan: Extubation in OR  Informed Consent: I have reviewed the patients History and Physical, chart, labs and discussed the procedure including the risks, benefits and alternatives for the proposed anesthesia with the patient or authorized representative who has indicated his/her understanding and acceptance.     Dental Advisory Given  Plan Discussed with: Anesthesiologist, CRNA and Surgeon  Anesthesia Plan Comments: (Patient consented for risks of anesthesia including but not limited to:  - adverse reactions to medications - damage to eyes, teeth, lips or other oral mucosa - nerve damage due to positioning  - sore throat or hoarseness - Damage to heart, brain, nerves, lungs, other parts of body or loss of life  Patient voiced understanding.)       Anesthesia Quick Evaluation

## 2023-02-21 NOTE — Telephone Encounter (Signed)
erreo

## 2023-02-21 NOTE — Transfer of Care (Signed)
Immediate Anesthesia Transfer of Care Note  Patient: Carla [name]  Procedure(s) Performed: AMPUTATION ABOVE KNEE (Left: Knee)  Patient Location: PACU  Anesthesia Type:General  Level of Consciousness: sedated  Airway & Oxygen Therapy: Patient Spontanous Breathing and Patient connected to face mask oxygen  Post-op Assessment: Report given to RN and Post -op Vital signs reviewed and stable  Post vital signs: Reviewed  Last Vitals:  Vitals Value Taken Time  BP 160/95   Temp    Pulse 103 02/21/23 1210  Resp 12 02/21/23 1210  SpO2 93 % 02/21/23 1210  Vitals shown include unvalidated device data.  Last Pain:  Vitals:   02/21/23 0923  TempSrc: Oral  PainSc:       Patients Stated Pain Goal: 2 (02/20/23 2112)  Complications: No notable events documented.

## 2023-02-22 ENCOUNTER — Telehealth (HOSPITAL_COMMUNITY): Payer: Self-pay | Admitting: Pharmacy Technician

## 2023-02-22 ENCOUNTER — Encounter: Payer: Self-pay | Admitting: Vascular Surgery

## 2023-02-22 ENCOUNTER — Other Ambulatory Visit (HOSPITAL_COMMUNITY): Payer: Self-pay

## 2023-02-22 ENCOUNTER — Ambulatory Visit (INDEPENDENT_AMBULATORY_CARE_PROVIDER_SITE_OTHER): Payer: 59 | Admitting: Nurse Practitioner

## 2023-02-22 DIAGNOSIS — I998 Other disorder of circulatory system: Secondary | ICD-10-CM | POA: Diagnosis not present

## 2023-02-22 LAB — CBC
HCT: 33.5 % — ABNORMAL LOW (ref 36.0–46.0)
Hemoglobin: 10.3 g/dL — ABNORMAL LOW (ref 12.0–15.0)
MCH: 27.8 pg (ref 26.0–34.0)
MCHC: 30.7 g/dL (ref 30.0–36.0)
MCV: 90.3 fL (ref 80.0–100.0)
Platelets: 314 10*3/uL (ref 150–400)
RBC: 3.71 MIL/uL — ABNORMAL LOW (ref 3.87–5.11)
RDW: 17 % — ABNORMAL HIGH (ref 11.5–15.5)
WBC: 15.6 10*3/uL — ABNORMAL HIGH (ref 4.0–10.5)
nRBC: 0 % (ref 0.0–0.2)

## 2023-02-22 MED ORDER — HYDROMORPHONE HCL 1 MG/ML IJ SOLN
2.0000 mg | INTRAMUSCULAR | Status: DC | PRN
  Administered 2023-02-22 – 2023-02-23 (×3): 2 mg via INTRAVENOUS
  Filled 2023-02-22 (×2): qty 2

## 2023-02-22 NOTE — Evaluation (Signed)
Physical Therapy Evaluation Patient Details Name: Carla [name] MRN: [id number] DOB: 1961/05/13 Today's Date: 02/22/2023  History of Present Illness  62 y.o. female s/p L AKA 02/21/23 due to ongoing ischemia.  H/O right above-knee amputation, revision of amputation with resection of femur in Feb 2024. PMH includes PAD s/p R AKA 04/06/22,  abdominal aortic atherosclerosis, AKI, anxiety, Bell's palsy, CAD, CHF, chronic pain, COPD, h/o colectomy, COVID-19, htn, NSTEMI, SBO, large ventral hernia with ulceration, h/o GI bleed, a-fib on Eliquis, L axillary-femoral bypass graft 07/23/2022.  Clinical Impression  Limited eval secondary to pt c/o exquisite pain in both the acute L AKA as well as ongoing R AKA issues.  She showed ability to do some limited AROM in the R LE, but due to pain refused/could not do any AROM on R and refused attempts at gentle PROM.  Pt made some effort to try rolling in bed but on initiation of movement called out in pain and refused further mobility.  Pt reports she has equipment at home and 24/7 assist but that she has largely been bed bound for months.  Educated on appropriate positioning and acute AKA exercises, pt will benefit from continued PT to address functional limitation and educate for decreased caregiver burden.  RN notified about pt request for more pain meds.      Recommendations for follow up therapy are one component of a multi-disciplinary discharge planning process, led by the attending physician.  Recommendations may be updated based on patient status, additional functional criteria and insurance authorization.  Follow Up Recommendations       Assistance Recommended at Discharge Frequent or constant Supervision/Assistance  Patient can return home with the following  Assistance with feeding;Assistance with cooking/housework;A little help with bathing/dressing/bathroom    Equipment Recommendations Other (comment) (has essential equipment at home)  Recommendations  for Other Services       Functional Status Assessment Patient has had a recent decline in their functional status and demonstrates the ability to make significant improvements in function in a reasonable and predictable amount of time.     Precautions / Restrictions Precautions Precautions: Fall Restrictions Weight Bearing Restrictions: Yes RLE Weight Bearing: Weight bearing as tolerated (per chat no WB limitations - pt very pain limited) LLE Weight Bearing: Non weight bearing (per chat - very pain limited)      Mobility  Bed Mobility Overal bed mobility: Needs Assistance Bed Mobility: Rolling Rolling: Total assist         General bed mobility comments: Pt showed some effort in getting UEs to bed rail and starting to initiate roll to L, however became increasingly anxious and tearful and ultimately when PT assisted to initiate actual roll to the side she screamed out in pain "please, no" and refused further active or passive mobility    Transfers                        Ambulation/Gait                  Stairs            Wheelchair Mobility    Modified Rankin (Stroke Patients Only)       Balance Overall balance assessment:  (untested as pt unable to tolerate roll to side much less attempts at sitting EOB)  Pertinent Vitals/Pain Pain Assessment Pain Assessment: 0-10 Pain Score: 10-Worst pain ever Pain Location: Pt reports severe pain in the R residual limb and intolerable pain in the L    Home Living Family/patient expects to be discharged to:: Private residence Living Arrangements: Children Available Help at Discharge: Family;Available PRN/intermittently Type of Home: Mobile home Home Access: Level entry       Home Layout: One level Home Equipment: Wheelchair - manual;Shower seat;Hospital bed Secondary school teacher)      Prior Function Prior Level of Function : Needs assist              Mobility Comments: Pt reports she has essentially been bed bound since R AKA revision 10 weeks ago - family with max assist transfer to w/c (have not used [employer] much) when she needs to get to MD but reports reports minimal OOB activity       Hand Dominance        Extremity/Trunk Assessment   Upper Extremity Assessment Upper Extremity Assessment: Overall WFL for tasks assessed;Generalized weakness    Lower Extremity Assessment Lower Extremity Assessment:  (limited testing due to pain, pt refuses any AROM on the L, able to raise R stump and initiate Abd but anxious and severely pain hesitant)       Communication   Communication: No difficulties  Cognition Arousal/Alertness: Awake/alert Behavior During Therapy: Restless, Anxious Overall Cognitive Status: Within Functional Limits for tasks assessed                                          General Comments General comments (skin integrity, edema, etc.): severely pain limited/hesitant    Exercises     Assessment/Plan    PT Assessment Patient needs continued PT services  PT Problem List Decreased strength;Decreased range of motion;Decreased activity tolerance;Decreased mobility;Decreased balance;Decreased safety awareness;Pain       PT Treatment Interventions Functional mobility training;Therapeutic activities;Therapeutic exercise;Balance training;Patient/family education    PT Goals (Current goals can be found in the Care Plan section)  Acute Rehab PT Goals Patient Stated Goal: control pain PT Goal Formulation: With patient Time For Goal Achievement: 03/07/23 Potential to Achieve Goals: Poor    Frequency Min 3X/week     Co-evaluation               AM-PAC PT "6 Clicks" Mobility  Outcome Measure Help needed turning from your back to your side while in a flat bed without using bedrails?: Total Help needed moving from lying on your back to sitting on the side of a flat bed without using  bedrails?: Total Help needed moving to and from a bed to a chair (including a wheelchair)?: Total Help needed standing up from a chair using your arms (e.g., wheelchair or bedside chair)?: Total Help needed to walk in hospital room?: Total Help needed climbing 3-5 steps with a railing? : Total 6 Click Score: 6    End of Session   Activity Tolerance: Patient limited by pain Patient left: with bed alarm set;with call bell/phone within reach Nurse Communication: Mobility status PT Visit Diagnosis: Muscle weakness (generalized) (M62.81);Pain Pain - Right/Left: Left Pain - part of body: Hip    Time: 8295-6213 PT Time Calculation (min) (ACUTE ONLY): 17 min   Charges:   PT Evaluation $PT Eval Low Complexity: 1 Low          Malachi Pro, DPT 02/22/2023, 11:22  AM

## 2023-02-22 NOTE — Telephone Encounter (Signed)
Patient Advocate Encounter  Prior Authorization for Repatha SureClick 140MG /ML auto-injectors  has been approved.    PA# [id number] Insurance [employer] Medicare Part D Electronic Prior Authorization Form  Effective dates: 02/22/2023 through 08/25/2023  Patients co-pay is $0.00.     Roland Earl, CPhT Pharmacy Patient Advocate Specialist [facility] Patient Advocate Team Direct Number: [phone]  Fax: [phone]

## 2023-02-22 NOTE — TOC Progression Note (Signed)
Transition of Care [facility]) - Progression Note    Patient Details  Name: Carla [name] MRN: [id number] Date of Birth: 1961/07/19  [facility] CM/SW Contact  Marlowe Sax, RN Phone Number: 02/22/2023, 3:10 PM  Clinical Narrative:     The patient lives at home with her family and they are her caregivers I notified French Ana at [facility] that she will need a wound vac She will work on it and get back to me ASAP, Dressing change tomorrow Plan to DC on Sat  Expected Discharge Plan: Home w Home Health Services Barriers to Discharge: Continued Medical Work up  Expected Discharge Plan and Services   Discharge Planning Services: CM Consult   Living arrangements for the past 2 months: Single Family Home                 DME Arranged: Negative pressure wound device DME Agency: [employer] Date DME Agency Contacted: 02/22/23 Time DME Agency Contacted: [id number] Representative spoke with at DME Agency: French Ana             Social Determinants of Health (SDOH) Interventions SDOH Screenings   Food Insecurity: No Food Insecurity (01/17/2023)  Housing: Low Risk  (01/17/2023)  Transportation Needs: No Transportation Needs (01/17/2023)  Utilities: Not At Risk (01/17/2023)  Depression (PHQ2-9): Low Risk  (01/25/2023)  Tobacco Use: Medium Risk (02/22/2023)    Readmission Risk Interventions    12/06/2022    1:31 PM 07/25/2022   10:49 AM 05/05/2022   10:52 AM  Readmission Risk Prevention Plan  Transportation Screening Complete Complete Complete  Medication Review Oceanographer) Complete Complete Complete  PCP or Specialist appointment within 3-5 days of discharge  Complete Complete  HRI or Home Care Consult  Complete   SW Recovery Care/Counseling Consult Complete Not Complete Complete  SW Consult Not Complete Comments  NA   Palliative Care Screening Not Applicable  Not Applicable  Skilled Nursing Facility  Patient Refused Patient Refused

## 2023-02-22 NOTE — TOC Benefit Eligibility Note (Signed)
Patient Product/process development scientist completed.    The patient is currently admitted and upon discharge could be taking Repatha SureClick 140 mg/ml Soaj .  Prior Authorization Required   The patient is insured through [employer] Part D   This test claim was processed through [facility]- copay amounts may vary at other pharmacies due to pharmacy/plan contracts, or as the patient moves through the different stages of their insurance plan.  Roland Earl, CPHT Pharmacy Patient Advocate Specialist [facility] Pharm[facility] Patient Advocate Team Direct Number: [phone]  Fax: [phone]

## 2023-02-22 NOTE — Telephone Encounter (Signed)
Patient Advocate Encounter   Received notification that prior authorization for Repatha SureClick 140MG /ML auto-injectors is required.   PA submitted on 02/22/2023 Key [id number] Insurance [employer] Medicare Part D Electronic Prior Authorization Form Status is pending       Roland Earl, CPhT Pharmacy Patient Advocate Specialist [facility] Pharmacy Patient Advocate Team Direct Number: [phone]  Fax: [phone]

## 2023-02-22 NOTE — Plan of Care (Signed)

## 2023-02-22 NOTE — Progress Notes (Signed)
Nutrition Follow-up  DOCUMENTATION CODES:   Obesity unspecified  INTERVENTION:   -Continue regular diet -Continue Ensure Enlive po TID, each supplement provides 350 kcal and 20 grams of protein.  -Continue 500 mg vitamin C BID -Continue 220 mg zinc sulfate daily  NUTRITION DIAGNOSIS:   Increased nutrient needs related to wound healing as evidenced by estimated needs.  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  5/1- s/p lt AKA  Reviewed I/O's: -120 ml x 24 hours and -10.9 L since admission  UOP: 1.5 L x 24 hours   Plan for rt stump dressing change tomorrow by [employer].   Pt lying in bed, talking on phone at time of visit.   Pt remains on regular diet. Noted meal completions 10-75%. Pt is also drinking supplements. Due to increased nutritional needs for healing, pt would greatly benefit from continuing supplements.     Medications reviewed and include cardizem, vitamin B-12, ferrous sulfate, miralax, and vitamin D. Pt with no BM since 02/14/23. Miralax added on 02/20/23.   Labs reviewed.   Diet Order:   Diet Order              Diet regular Room service appropriate? Yes; Fluid consistency: Thin  Diet effective now                   EDUCATION NEEDS:   Education needs have been addressed  Skin:  Skin Assessment: Skin Integrity Issues: Skin Integrity Issues:: Wound VAC, Incisions Wound Vac: rt stump Incisions: s/p lt AKA  Last BM:  02/14/23  Height:   Ht Readings from Last 1 Encounters:  02/15/23 5\' 2"  (1.575 m)    Weight:   Wt Readings from Last 1 Encounters:  02/22/23 85 kg    Ideal Body Weight:  42 kg (adjusted for bilateral AKA)  BMI:  Body mass index is 34.27 kg/m.  Estimated Nutritional Needs:   Kcal:  1700-1900  Protein:  85-100 grams  Fluid:  > 1.7 L    Levada Schilling, RD, LDN, CDCES Registered Dietitian II Certified Diabetes Care and Education Specialist Please refer to [facility] for RD and/or RD on-call/weekend/after hours pager

## 2023-02-22 NOTE — TOC Progression Note (Signed)
Transition of Care [facility]) - Progression Note    Patient Details  Name: Carla [name] MRN: [id number] Date of Birth: 1961-01-05  [facility]) CM/SW Contact  Marlowe Sax, RN Phone Number: 02/22/2023, 11:16 AM  Clinical Narrative:     The patient had surgery yesterday, TOC to follow and assist with Needs and DC planning, Therapy to eval       Expected Discharge Plan and Services                                               Social Determinants of Health (SDOH) Interventions SDOH Screenings   Food Insecurity: No Food Insecurity (01/17/2023)  Housing: Low Risk  (01/17/2023)  Transportation Needs: No Transportation Needs (01/17/2023)  Utilities: Not At Risk (01/17/2023)  Depression (PHQ2-9): Low Risk  (01/25/2023)  Tobacco Use: Medium Risk (02/22/2023)    Readmission Risk Interventions    12/06/2022    1:31 PM 07/25/2022   10:49 AM 05/05/2022   10:52 AM  Readmission Risk Prevention Plan  Transportation Screening Complete Complete Complete  Medication Review Oceanographer) Complete Complete Complete  PCP or Specialist appointment within 3-5 days of discharge  Complete Complete  HRI or Home Care Consult  Complete   SW Recovery Care/Counseling Consult Complete Not Complete Complete  SW Consult Not Complete Comments  NA   Palliative Care Screening Not Applicable  Not Applicable  Skilled Nursing Facility  Patient Refused Patient Refused

## 2023-02-22 NOTE — Progress Notes (Signed)
[facility]  - [place] at [facility]   PATIENT NAME: Carla [name]    MR#:  [id number]  DATE OF BIRTH:  11/30/1960  SUBJECTIVE:  no family at bedside. Chronic pain. Tearful abot getting dressing changed tomorrow. Wants to go home at d/c Does not want wound vac on right stump   VITALS:  Blood pressure 118/76, pulse (!) 107, temperature 99.4 F (37.4 C), resp. rate 18, height 5\' 2"  (1.575 m), weight 85 kg, SpO2 94 %.  PHYSICAL EXAMINATION:   GENERAL:  62 y.o.-year-old patient with no acute distress.  LUNGS: Normal breath sounds bilaterally, no wheezing CARDIOVASCULAR: S1, S2 normal. No murmur   ABDOMEN: Soft, nontender, nondistended. Bowel sounds present.  EXTREMITIES: left AKA stump dressing +, right stump wound + NEUROLOGIC: nonfocal  patient is  awake   LABORATORY PANEL:  CBC Recent Labs  Lab 02/22/23 0512  WBC 15.6*  HGB 10.3*  HCT 33.5*  PLT 314     Chemistries  Recent Labs  Lab 02/19/23 0421  NA 139  K 3.8  CL 101  CO2 27  GLUCOSE 151*  BUN 9  CREATININE 0.68  CALCIUM 9.2    Assessment and Plan 62 y.o. female with medical history significant of severe PAD s/p right AKA, CAD, HFpEF, hypertension, hyperlipidemia, paroxysmal atrial fibrillation on Eliquis, SMA thrombosis, who presents to the ED due to leg pain.      Cardiac catheterization showed a proximal RCA lesion 30% stenosed and a first diagonal lesion 50% stenosis, EF 65% by visual estimate. ST elevation believed to be secondary to coronary spasm and Imdur was increased.   5/1: patient just got left above knee amputation due to severe ischemia. Appears sedated from anesthesia. No family in the room. Okay with Dr. due to start eliquis. Discontinue heparin drip. 5/2 patient tearful and worried about drastic change in pain tomorrow. Wants to go home at discharge. Not too keen on getting wound VAC for right amputation stump.  Acute lower limb ischemia Extensive history of PAD, with prior  right AKA (June 2023) with recent stump revision (February 2024), and left femoral bypass mechanical thrombectomy (March 2024). Patient is presenting with 1 week of left lower extremity. - Oral (increased to oxycodone 15 mg every 4 hours as needed on 4/28) and IV pain medications -s/p  left AKA  per Dr Wyn Quaker (02/21/23) -- continue current pain meds. -- Wound evaluation by practitioner to see if she will continue to need wound VAC   ST elevation Patient presented with chest pain has been intermittent, in addition to ST elevation in the lateral leads.   -Left heart cath demonstrated RCA spasm, resolved with nitroglycerin.  No stenting required. - Imdur increased to 120 mg daily - Continue Cardizem if blood pressure allows.   Coronary artery disease involving native coronary artery of native heart - Nonobstructive CAD, continue aspirin, Imdur   Paroxysmal atrial fibrillation (HCC) - Cardizem if blood pressure allows. - Anticoagulation with heparin--ok to restart eliquis   Chronic diastolic CHF (congestive heart failure) (HCC) Patient appears euvolemic at this time.    COPD (chronic obstructive pulmonary disease) (HCC) Continue home bronchodilators with DuoNebs as needed.   Chronic pain Increased oxycodone on 4/28 and continue with Dilaudid for breakthrough pain given acute ischemia   Obesity, Class III, BMI 40-49.9 (morbid obesity) (HCC) BMI 41.49 with current height and weight.   Constipation -- use bowel prep/regimen    Procedures: Family communication :none today Consults :Vascular, cardiology CODE STATUS:FULL  DVT Prophylaxis :eliquis Level of care: Med-Surg Status is: Inpatient Remains inpatient appropriate because: POD 1 left AKA needs amputation stump dressing change tomorrow along with assessment of right stump   patient will go home at discharge likely 1-2 days TOTAL TIME TAKING CARE OF THIS PATIENT: 35 minutes.  >50% time spent on counselling and coordination of  care  Note: This dictation was prepared with Dragon dictation along with smaller phrase technology. Any transcriptional errors that result from this process are unintentional.  [name] M.D    [facility]   CC: Primary care physician; Terri Piedra, PA-C

## 2023-02-22 NOTE — Progress Notes (Signed)
Progress Note    02/22/2023 11:22 AM 1 Day Post-Op  Subjective:  Carla [name]. Carla [name] is a 62 year old female who is now status post op day 1 from left lower extremity AKA.  Patient rest comfortably in bed.  She endorses pain to her left lower extremity.  She denies any chest pain or shortness of breath this morning.  Denies any nausea vomiting or diarrhea.  No complaints overnight vitals all remained stable.  Patient is requesting to go home.   Vitals:   02/22/23 0504 02/22/23 0717  BP: 116/73 118/76  Pulse: 90 (!) 107  Resp: 20 18  Temp: 99.8 F (37.7 C) 99.4 F (37.4 C)  SpO2: 97% 94%   Physical Exam: Cardiac:  AFIB, controlled rate, No murmurs  Lungs:  Clear on auscultation throughout. Without rales rhonchi or wheezing. Nonlabored breathing.  Incisions:  Left lower extremity AKA. Dressing clean dry and intact. No drainage noted.  Extremities:    Rt AKA, Left lower extremity  AKA.  Abdomen:   Positive bowel sounds with positive hernia, soft, NT/ND, no masses  Neurologic: A&O X 3; No focal weakness or paresthesias are detected; speech is fluent/normal  CBC    Component Value Date/Time   WBC 15.6 (H) 02/22/2023 0512   RBC 3.71 (L) 02/22/2023 0512   HGB 10.3 (L) 02/22/2023 0512   HGB 13.4 02/12/2015 1137   HCT 33.5 (L) 02/22/2023 0512   HCT 40.9 02/12/2015 1137   PLT 314 02/22/2023 0512   PLT 272 02/12/2015 1137   MCV 90.3 02/22/2023 0512   MCV 90 02/12/2015 1137   MCH 27.8 02/22/2023 0512   MCHC 30.7 02/22/2023 0512   RDW 17.0 (H) 02/22/2023 0512   RDW 14.8 (H) 02/12/2015 1137   LYMPHSABS 1.6 02/16/2023 1141   LYMPHSABS 1.7 08/27/2014 1145   MONOABS 0.4 02/16/2023 1141   MONOABS 0.4 08/27/2014 1145   EOSABS 0.3 02/16/2023 1141   EOSABS 0.0 08/27/2014 1145   BASOSABS 0.0 02/16/2023 1141   BASOSABS 0.0 08/27/2014 1145    BMET    Component Value Date/Time   NA 139 02/19/2023 0421   NA 140 02/12/2015 1137   K 3.8 02/19/2023 0421   K 2.9 (L) 02/12/2015 1137   CL  101 02/19/2023 0421   CL 107 02/12/2015 1137   CO2 27 02/19/2023 0421   CO2 27 02/12/2015 1137   GLUCOSE 151 (H) 02/19/2023 0421   GLUCOSE 120 (H) 02/12/2015 1137   BUN 9 02/19/2023 0421   BUN 15 02/12/2015 1137   CREATININE 0.68 02/19/2023 0421   CREATININE 0.78 02/12/2015 1137   CALCIUM 9.2 02/19/2023 0421   CALCIUM 9.0 02/12/2015 1137   GFRNONAA >60 02/19/2023 0421   GFRNONAA >60 02/12/2015 1137   GFRAA >60 11/23/2019 1330   GFRAA >60 02/12/2015 1137    INR    Component Value Date/Time   INR 1.2 02/15/2023 1136   INR 0.8 07/23/2012 0433     Intake/Output Summary (Last 24 hours) at 02/22/2023 1122 Last data filed at 02/22/2023 1042 Gross per 24 hour  Intake 1230 ml  Output 2100 ml  Net -870 ml     Assessment/Plan:  62 y.o. female is s/p Left lower extremity AKA 1 Day Post-Op   Plan:  Patient to continue Eliquis and ASA  Per Vascular Surgery Okay to discharge tomorrow after dressing change to Left Lower Extremity.  Wound care to follow up with care to right lower extremity with instructions for discharge. No need for wound vac  to go home with patient.  DVT prophylaxis:  Eliquis 5 mg BID and ASA 81 mg daily   [name] [facility] 02/22/2023 11:22 AM

## 2023-02-22 NOTE — Telephone Encounter (Signed)
Pharmacy Patient Advocate Encounter  Insurance verification completed.    The patient is insured through [employer]   The patient is currently admitted and ran test claims for the following: [place].  Copays and coinsurance results were relayed to Inpatient clinical team.

## 2023-02-23 DIAGNOSIS — I998 Other disorder of circulatory system: Secondary | ICD-10-CM | POA: Diagnosis not present

## 2023-02-23 MED ORDER — BISACODYL 10 MG RE SUPP
10.0000 mg | Freq: Every day | RECTAL | Status: DC
  Administered 2023-02-23 – 2023-02-24 (×2): 10 mg via RECTAL
  Filled 2023-02-23 (×2): qty 1

## 2023-02-23 MED ORDER — HYDROMORPHONE HCL 1 MG/ML IJ SOLN
2.0000 mg | Freq: Once | INTRAMUSCULAR | Status: DC
  Filled 2023-02-23: qty 2

## 2023-02-23 NOTE — TOC Progression Note (Signed)
Transition of Care [facility]) - Progression Note    Patient Details  Name: Carla [name] MRN: [id number] Date of Birth: 10/20/1961  Transition of Care [facility] CM/SW Contact  Marlowe Sax, RN Phone Number: 02/23/2023, 2:01 PM  Clinical Narrative:    Met with the patient and discussed with her the fact that she has used all of the free insurance days at [facility] and to go to STR again it would be 204$ per day copay out of pocket, she stated that she is not able to do that and that she will go home, She requested that I call her daughter [name] and let her know, I called Brandi and left a general VM for a call back, the patient understands that the plan is to DC home with [facility] tomorrow, she has a hospital bed, hoyer lift, Wheelchair at home, does not need additional, she is open with HH with [facility],  I explained to her in order for her STR Bed days free to start over she would need a 60 day break from being in the hospital and in STR, she stated understanding     Expected Discharge Plan: Home w Home Health Services Barriers to Discharge: Continued Medical Work up  Expected Discharge Plan and Services   Discharge Planning Services: CM Consult   Living arrangements for the past 2 months: Single Family Home                 DME Arranged: Negative pressure wound device DME Agency: [employer] Date DME Agency Contacted: 02/22/23 Time DME Agency Contacted: [id number] Representative spoke with at DME Agency: French [employer]             Social Determinants of Health (SDOH) Interventions SDOH Screenings   Food Insecurity: No Food Insecurity (01/17/2023)  Housing: Low Risk  (01/17/2023)  Transportation Needs: No Transportation Needs (01/17/2023)  Utilities: Not At Risk (01/17/2023)  Depression (PHQ2-9): Low Risk  (01/25/2023)  Tobacco Use: Medium Risk (02/22/2023)    Readmission Risk Interventions    12/06/2022    1:31 PM 07/25/2022   10:49 AM 05/05/2022   10:52 AM  Readmission Risk Prevention Plan  Transportation  Screening Complete Complete Complete  Medication Review Oceanographer) Complete Complete Complete  PCP or Specialist appointment within 3-5 days of discharge  Complete Complete  HRI or Home Care Consult  Complete   SW Recovery Care/Counseling Consult Complete Not Complete Complete  SW Consult Not Complete Comments  NA   Palliative Care Screening Not Applicable  Not Applicable  Skilled Nursing Facility  Patient Refused Patient Refused

## 2023-02-23 NOTE — TOC Progression Note (Signed)
Transition of Care [facility]) - Progression Note    Patient Details  Name: Carla [name] MRN: [id number] Date of Birth: 04/28/1961  Transition of Care [facility]) CM/SW Contact  Marlowe Sax, RN Phone Number: 02/23/2023, 9:48 AM  Clinical Narrative:    Confirmed that the patient is open with [facility] for Nursing to manage the wound vac, she has a hospital bed, a hoyer lift and a wheelchair at home   Expected Discharge Plan: Home w Home Health Services Barriers to Discharge: Continued Medical Work up  Expected Discharge Plan and Services   Discharge Planning Services: CM Consult   Living arrangements for the past 2 months: Single Family Home                 DME Arranged: Negative pressure wound device DME Agency: [employer] Date DME Agency Contacted: 02/22/23 Time DME Agency Contacted: [phone] Representative spoke with at DME Agency: French Ana             Social Determinants of Health (SDOH) Interventions SDOH Screenings   Food Insecurity: No Food Insecurity (01/17/2023)  Housing: Low Risk  (01/17/2023)  Transportation Needs: No Transportation Needs (01/17/2023)  Utilities: Not At Risk (01/17/2023)  Depression (PHQ2-9): Low Risk  (01/25/2023)  Tobacco Use: Medium Risk (02/22/2023)    Readmission Risk Interventions    12/06/2022    1:31 PM 07/25/2022   10:49 AM 05/05/2022   10:52 AM  Readmission Risk Prevention Plan  Transportation Screening Complete Complete Complete  Medication Review Oceanographer) Complete Complete Complete  PCP or Specialist appointment within 3-5 days of discharge  Complete Complete  HRI or Home Care Consult  Complete   SW Recovery Care/Counseling Consult Complete Not Complete Complete  SW Consult Not Complete Comments  NA   Palliative Care Screening Not Applicable  Not Applicable  Skilled Nursing Facility  Patient Refused Patient Refused

## 2023-02-23 NOTE — Plan of Care (Signed)
  Problem: Coping: Goal: Level of anxiety will decrease Outcome: Progressing   Problem: Nutrition: Goal: Adequate nutrition will be maintained Outcome: Progressing   Problem: Pain Managment: Goal: General experience of comfort will improve Outcome: Progressing   Problem: Skin Integrity: Goal: Risk for impaired skin integrity will decrease Outcome: Progressing   Problem: Safety: Goal: Ability to remain free from injury will improve Outcome: Progressing   Problem: Elimination: Goal: Will not experience complications related to bowel motility Outcome: Progressing

## 2023-02-23 NOTE — Progress Notes (Signed)
Progress Note    02/23/2023 2:24 PM 2 Days Post-Op  Subjective:  Carla [name] is a 62 year old female who is now status post op day 1 from left lower extremity AKA.  Patient rest comfortably in bed.  She endorses pain to her left lower extremity.  She denies any chest pain or shortness of breath this morning.  Denies any nausea vomiting or diarrhea.  No complaints overnight vitals all remained stable.  Patient is requesting to go home.    Vitals:   02/22/23 2343 02/23/23 0745  BP: 107/72 120/73  Pulse: 96 94  Resp: 18 19  Temp: 98 F (36.7 C) 98.7 F (37.1 C)  SpO2: 95% 95%   Physical Exam: Cardiac:   AFIB, controlled rate, No murmurs  Lungs:  Clear on auscultation throughout. Without rales rhonchi or wheezing. Nonlabored breathing.  Incisions:   Left lower extremity AKA. Dressing clean dry and intact. No drainage noted.  Extremities:   Rt AKA, Left lower extremity  AKA.  Abdomen:  Positive bowel sounds with positive hernia, soft, NT/ND, no masses  Neurologic: A&O X 3; No focal weakness or paresthesias are detected; speech is fluent/normal   CBC    Component Value Date/Time   WBC 15.6 (H) 02/22/2023 0512   RBC 3.71 (L) 02/22/2023 0512   HGB 10.3 (L) 02/22/2023 0512   HGB 13.4 02/12/2015 1137   HCT 33.5 (L) 02/22/2023 0512   HCT 40.9 02/12/2015 1137   PLT 314 02/22/2023 0512   PLT 272 02/12/2015 1137   MCV 90.3 02/22/2023 0512   MCV 90 02/12/2015 1137   MCH 27.8 02/22/2023 0512   MCHC 30.7 02/22/2023 0512   RDW 17.0 (H) 02/22/2023 0512   RDW 14.8 (H) 02/12/2015 1137   LYMPHSABS 1.6 02/16/2023 1141   LYMPHSABS 1.7 08/27/2014 1145   MONOABS 0.4 02/16/2023 1141   MONOABS 0.4 08/27/2014 1145   EOSABS 0.3 02/16/2023 1141   EOSABS 0.0 08/27/2014 1145   BASOSABS 0.0 02/16/2023 1141   BASOSABS 0.0 08/27/2014 1145    BMET    Component Value Date/Time   NA 139 02/19/2023 0421   NA 140 02/12/2015 1137   K 3.8 02/19/2023 0421   K 2.9 (L) 02/12/2015 1137   CL 101  02/19/2023 0421   CL 107 02/12/2015 1137   CO2 27 02/19/2023 0421   CO2 27 02/12/2015 1137   GLUCOSE 151 (H) 02/19/2023 0421   GLUCOSE 120 (H) 02/12/2015 1137   BUN 9 02/19/2023 0421   BUN 15 02/12/2015 1137   CREATININE 0.68 02/19/2023 0421   CREATININE 0.78 02/12/2015 1137   CALCIUM 9.2 02/19/2023 0421   CALCIUM 9.0 02/12/2015 1137   GFRNONAA >60 02/19/2023 0421   GFRNONAA >60 02/12/2015 1137   GFRAA >60 11/23/2019 1330   GFRAA >60 02/12/2015 1137    INR    Component Value Date/Time   INR 1.2 02/15/2023 1136   INR 0.8 07/23/2012 0433     Intake/Output Summary (Last 24 hours) at 02/23/2023 1424 Last data filed at 02/23/2023 0518 Gross per 24 hour  Intake --  Output 700 ml  Net -700 ml     Assessment/Plan:  62 y.o. female is s/p Left lower extremity AKA  2 Days Post-Op   Plan:  Patient to continue Eliquis and ASA  Per Vascular Surgery Okay to discharge tomorrow after dressing change completed  to Left Lower Extremity. Dressing has been changed  Wound care to follow up with care to right lower extremity with instructions  for discharge.  Wound Vac to right lower extremity now. Patient will continue with wound vac at home.    DVT prophylaxis:  Eliquis 5 mg BID and ASA 81 mg daily   [name] [facility] 02/23/2023 2:24 PM

## 2023-02-23 NOTE — Progress Notes (Signed)
Physical Therapy Treatment Patient Details Name: Carla [name] MRN: [id number] DOB: [birth year] Today's Date: 02/23/2023   History of Present Illness Pt is a 62 y.o. female s/p L AKA 02/21/23 due to ongoing ischemia.  H/O right above-knee amputation, revision of amputation with resection of femur in Feb 2024. PMH includes PAD s/p R AKA 04/06/22,  abdominal aortic atherosclerosis, AKI, anxiety, Bell's palsy, CAD, CHF, chronic pain, COPD, h/o colectomy, COVID-19, htn, NSTEMI, SBO, large ventral hernia with ulceration, h/o GI bleed, a-fib on Eliquis, L axillary-femoral bypass graft 07/23/2022.    PT Comments    Pt anxious throughout the session with max encouragement and education provided on physiological benefits of activity.  Pt reported that she will be discharging home secondary to no SNF days left from prior admission.  Pt education provided on need to build tolerance to moving in preparation for discharge home.  Pt required +2 total assist to come to sitting at the EOB with pt pivoted on her chuck pads.  As soon as pt was in the sitting position she began to cry out in pain and was returned to supine and positioned for comfort with all needs in reach.  Pt will benefit from continued PT services upon discharge to safely address deficits listed in patient problem list for decreased caregiver assistance and eventual return to PLOF.    Recommendations for follow up therapy are one component of a multi-disciplinary discharge planning process, led by the attending physician.  Recommendations may be updated based on patient status, additional functional criteria and insurance authorization.  Follow Up Recommendations       Assistance Recommended at Discharge Frequent or constant Supervision/Assistance  Patient can return home with the following Assistance with feeding;Assistance with cooking/housework;A lot of help with bathing/dressing/bathroom;Direct supervision/assist for medications management;Assist  for transportation;Help with stairs or ramp for entrance   Equipment Recommendations  None recommended by PT    Recommendations for Other Services       Precautions / Restrictions Precautions Precautions: Fall Restrictions Weight Bearing Restrictions: Yes RLE Weight Bearing: Weight bearing as tolerated LLE Weight Bearing: Non weight bearing     Mobility  Bed Mobility Overal bed mobility: Needs Assistance Bed Mobility: Supine to Sit     Supine to sit: +2 for physical assistance, Total assist     General bed mobility comments: Pt put forth very little effort with chuck pads utilized to pivot pt from sup to/from sitting at EOB with +2 total assist    Transfers                   General transfer comment: NT secondary to extreme pain in sitting position    Ambulation/Gait                   Stairs             Wheelchair Mobility    Modified Rankin (Stroke Patients Only)       Balance Overall balance assessment: Needs assistance Sitting-balance support: Bilateral upper extremity supported Sitting balance-[name]: Poor Sitting balance - Comments: Min A to prevent posterior lean                                    Cognition Arousal/Alertness: Awake/alert Behavior During Therapy: Anxious Overall Cognitive Status: Within Functional Limits for tasks assessed  Exercises Other Exercises Other Exercises: Extensive education provided on physiological benefits of activity/movement and repositioning body for pressure relief    General Comments        Pertinent Vitals/Pain Pain Assessment Pain Assessment: 0-10 Pain Score: 10-Worst pain ever Pain Location: RLE Pain Descriptors / Indicators: Sore, Aching, Guarding, Grimacing, Moaning Pain Intervention(s): Repositioned, Premedicated before session, Monitored during session, Patient requesting pain meds-RN notified     Home Living                          Prior Function            PT Goals (current goals can now be found in the care plan section) Progress towards PT goals: Progressing toward goals    Frequency    Min 3X/week      PT Plan Current plan remains appropriate    Co-evaluation              AM-PAC PT "6 Clicks" Mobility   Outcome Measure  Help needed turning from your back to your side while in a flat bed without using bedrails?: Total Help needed moving from lying on your back to sitting on the side of a flat bed without using bedrails?: Total Help needed moving to and from a bed to a chair (including a wheelchair)?: Total Help needed standing up from a chair using your arms (e.g., wheelchair or bedside chair)?: Total Help needed to walk in hospital room?: Total Help needed climbing 3-5 steps with a railing? : Total 6 Click Score: 6    End of Session   Activity Tolerance: Patient limited by pain Patient left: with bed alarm set;with call bell/phone within reach;in bed Nurse Communication: Mobility status PT Visit Diagnosis: Muscle weakness (generalized) (M62.81);Pain Pain - Right/Left: Left Pain - part of body: Hip     Time: 1610-9604 PT Time Calculation (min) (ACUTE ONLY): 19 min  Charges:  $Therapeutic Activity: 8-22 mins                     D. Scott Milanna Kozlov PT, DPT 02/23/23, 4:14 PM

## 2023-02-23 NOTE — Care Management Important Message (Signed)
Important Message  Patient Details  Name: Carla [name] MRN: [id number] Date of Birth: 1961-08-28   Medicare Important Message Given:  Yes     Johnell Comings 02/23/2023, 11:49 AM

## 2023-02-23 NOTE — Consult Note (Signed)
WOC Nurse wound follow up; WOC Team asked to reapply NPWT to R stump by vascular surgery  Wound type: surgical, full thickness  Measurement: 2.5 cms x 4.2 cms x 1.5 cms; wound has separated at most distal portion as per Wednesday note; 1.2 cm depth noted at this area (5-7 o'clock)  Wound bed:80% red and moist, 20% tan devitalized tissue at distal portion of wound bed  Drainage (amount, consistency, odor) minimal serosanguinous  Periwound: intact  Dressing procedure/placement/frequency: Removed old silver dressing.  Cleansed wound with normal saline Filled wound with 2 pieces of black foam with one piece being placed in depth at distal aspect of wound.   Sealed NPWT dressing at HG Patient received IV pain medication per bedside nurse prior to dressing change; tolerated fair  WOC nurse will continue to provide NPWT dressing changed due to the complexity of the dressing change M-W-F; next due 02/26/2023.   Thank you,    Priscella Mann MSN, RN-BC, [employer] [phone]

## 2023-02-23 NOTE — Plan of Care (Signed)

## 2023-02-23 NOTE — Progress Notes (Signed)
[facility]  - [place] at [facility]   PATIENT NAME: Carla [name]    MR#:  [id number]  DATE OF BIRTH:  [birth year]  SUBJECTIVE:  no family at bedside. Cont with Chronic pain.  Pt now has wound vac on right stump place Dressing on left AKA done this am Had BM+   VITALS:  Blood pressure 120/73, pulse 94, temperature 98.7 F (37.1 C), temperature source Oral, resp. rate 19, height 5\' 2"  (1.575 m), weight 85 kg, SpO2 95 %.  PHYSICAL EXAMINATION:   GENERAL:  62 y.o.-year-old patient with no acute distress.  LUNGS: Normal breath sounds bilaterally, no wheezing CARDIOVASCULAR: S1, S2 normal. No murmur   ABDOMEN: Soft, nontender, nondistended. Bowel sounds present.  EXTREMITIES: left AKA stump dressing +, right stump wound vac + NEUROLOGIC: nonfocal  patient is  awake   LABORATORY PANEL:  CBC Recent Labs  Lab 02/22/23 0512  WBC 15.6*  HGB 10.3*  HCT 33.5*  PLT 314     Chemistries  Recent Labs  Lab 02/19/23 0421  NA 139  K 3.8  CL 101  CO2 27  GLUCOSE 151*  BUN 9  CREATININE 0.68  CALCIUM 9.2    Assessment and Plan 62 y.o. female with medical history significant of severe PAD s/p right AKA, CAD, HFpEF, hypertension, hyperlipidemia, paroxysmal atrial fibrillation on Eliquis, SMA thrombosis, who presents to the ED due to leg pain.      Cardiac catheterization showed a proximal RCA lesion 30% stenosed and a first diagonal lesion 50% stenosis, EF 65% by visual estimate. ST elevation believed to be secondary to coronary spasm and Imdur was increased.   5/1: patient just got left above knee amputation due to severe ischemia. Appears sedated from anesthesia. No family in the room. Okay with Dr. due to start eliquis. Discontinue heparin drip. 5/2 patient tearful and worried about drastic change in pain tomorrow. Wants to go home at discharge. Not too keen on getting wound VAC for right amputation stump.  Acute lower limb ischemia Extensive history of PAD,  with prior right AKA (June 2023) with recent stump revision (February 2024), and left femoral bypass mechanical thrombectomy (March 2024). Patient is presenting with 1 week of left lower extremity. - Oral (increased to oxycodone 15 mg every 4 hours as needed on 4/28) and IV pain medications -s/p  left AKA  per Dr Wyn Quaker (02/21/23) -- continue current pain meds. -- s/p right amputation stump wound vac   ST elevation -Patient presented with chest pain has been intermittent, in addition to ST elevation in the lateral leads.   -Left heart cath demonstrated RCA spasm, resolved with nitroglycerin.  No stenting required. - Imdur increased to 120 mg daily - Continue Cardizem   Coronary artery disease involving native coronary artery of native heart - Nonobstructive CAD, continue aspirin, Imdur   Paroxysmal atrial fibrillation (HCC) - Cardizem if blood pressure allows. - Anticoagulation with heparin--ok to restart eliquis   Chronic diastolic CHF (congestive heart failure) (HCC) Patient appears euvolemic at this time.    COPD (chronic obstructive pulmonary disease) (HCC) Continue home bronchodilators with DuoNebs as needed.   Chronic pain Increased oxycodone on 4/28 and continue with Dilaudid for breakthrough pain given acute ischemia   Obesity, Class III, BMI 40-49.9 (morbid obesity) (HCC) BMI 41.49 with current height and weight.   Constipation -- pt had BM today  Okay from vascular surgery for discharge. Left amputation surgical dressing change today. Right amputation stump wound VAC placed. Patient does  not have any rehab days left. Plan is to discharge with home health. Patient is in agreement. I left message for daughter Shaterrica Guanzon    Procedures:left AKA Family communication :left message for Dter Adonis Brook Consults :Vascular, cardiology CODE STATUS:FULL  DVT Prophylaxis :eliquis Level of care: Med-Surg Status is: Inpatient Remains inpatient appropriate because: POD 2 left AKA    patient will go home in am if stable TOTAL TIME TAKING CARE OF THIS PATIENT: 35 minutes.  >50% time spent on counselling and coordination of care  Note: This dictation was prepared with Dragon dictation along with smaller phrase technology. Any transcriptional errors that result from this process are unintentional.  [name] M.D    [facility]   CC: Primary care physician; Terri Piedra, PA-C

## 2023-02-24 DIAGNOSIS — I998 Other disorder of circulatory system: Secondary | ICD-10-CM | POA: Diagnosis not present

## 2023-02-24 MED ORDER — ZINC SULFATE 220 (50 ZN) MG PO CAPS: 220.0000 mg | ORAL_CAPSULE | Freq: Every day | ORAL | 0 refills | Status: DC

## 2023-02-24 MED ORDER — CYANOCOBALAMIN 1000 MCG PO TABS: 1000.0000 ug | ORAL_TABLET | Freq: Every day | ORAL | 0 refills | Status: DC

## 2023-02-24 MED ORDER — HYDROMORPHONE HCL 1 MG/ML IJ SOLN
2.0000 mg | Freq: Four times a day (QID) | INTRAMUSCULAR | Status: DC | PRN
  Administered 2023-02-24: 2 mg via SUBCUTANEOUS
  Filled 2023-02-24: qty 2

## 2023-02-24 MED ORDER — ASCORBIC ACID 500 MG PO TABS: 500.0000 mg | ORAL_TABLET | Freq: Two times a day (BID) | ORAL | 0 refills | Status: AC

## 2023-02-24 MED ORDER — HYDROCODONE-ACETAMINOPHEN 10-325 MG PO TABS: 1.0000 | ORAL_TABLET | ORAL | 0 refills | Status: DC | PRN

## 2023-02-24 MED ORDER — HYDROCODONE-ACETAMINOPHEN 10-325 MG PO TABS
1.0000 | ORAL_TABLET | ORAL | Status: DC | PRN
  Administered 2023-02-24 (×2): 1 via ORAL
  Filled 2023-02-24 (×2): qty 1

## 2023-02-24 MED ORDER — ISOSORBIDE MONONITRATE ER 120 MG PO TB24: 120.0000 mg | ORAL_TABLET | Freq: Every day | ORAL | 1 refills | Status: AC

## 2023-02-24 NOTE — Discharge Summary (Addendum)
Physician Discharge Summary   Patient: Carla [name] MRN: [id number] DOB: [birth year]  Admit date:     02/15/2023  Discharge date: 02/24/23  Discharge Physician: [name]   PCP: Terri Piedra, PA-C   Recommendations at discharge:    F/u [facility] Plumber in 2-3 weeks for staple removal and wound vac chaeck F/u PCP in 1-2 weeks F/u your pain clinic physician at [place]  Discharge Diagnoses: Principal Problem:   Acute lower limb ischemia Active Problems:   ST elevation   Coronary artery disease involving native coronary artery of native heart   Amputation stump infection (HCC)   Paroxysmal atrial fibrillation (HCC)   Chronic diastolic CHF (congestive heart failure) (HCC)   COPD (chronic obstructive pulmonary disease) (HCC)   Chronic pain   Obesity, Class III, BMI 40-49.9 (morbid obesity) (HCC)   Atherosclerosis of artery of extremity with rest pain [facility])   Constipation  62 y.o. female with medical history significant of severe PAD s/p right AKA, CAD, HFpEF, hypertension, hyperlipidemia, paroxysmal atrial fibrillation on Eliquis, SMA thrombosis, who presents to the ED due to leg pain.      Cardiac catheterization showed a proximal RCA lesion 30% stenosed and a first diagonal lesion 50% stenosis, EF 65% by visual estimate. ST elevation believed to be secondary to coronary spasm and Imdur was increased.    5/1: patient just got left above knee amputation due to severe ischemia. Appears sedated from anesthesia. No family in the room. Okay with Dr. due to start eliquis. Discontinue heparin drip. 5/2 patient tearful and worried about drastic change in pain tomorrow. Wants to go home at discharge. Not too keen on getting wound VAC for right amputation stump.   Acute lower limb ischemia Extensive history of PAD, with prior right AKA (June 2023) with recent stump revision (February 2024), and left femoral bypass mechanical thrombectomy (March 2024). Patient is presenting with 1 week of  left lower extremity. - Oral (increased to oxycodone 15 mg every 4 hours as needed on 4/28) and IV pain medications -s/p  left AKA  per Dr Wyn Quaker (02/21/23) -- continue current pain meds. -- s/p right amputation stump wound vac--HH to resume at d/c   ST elevation -Patient presented with chest pain has been intermittent, in addition to ST elevation in the lateral leads.   -Left heart cath demonstrated RCA spasm, resolved with nitroglycerin.  No stenting required. - Imdur increased to 120 mg daily - Continue BB ( home med)   Coronary artery disease involving native coronary artery of native heart - Nonobstructive CAD, continue aspirin, Imdur, BB   Paroxysmal atrial fibrillation (HCC) - Cardizem if blood pressure allows. - Anticoagulation with heparin--ok to restart eliquis   Chronic diastolic CHF (congestive heart failure) (HCC) Patient appears euvolemic at this time.    COPD (chronic obstructive pulmonary disease) (HCC) Continue home bronchodilators with DuoNebs as needed.   Chronic pain Pt placed back on Norco 10/325 one tablet Q4 PRN. She follows at pain clinic in [facility]. She is recommended to make follow-up appointment   Obesity, Class III, BMI 40-49.9 (morbid obesity) (HCC) BMI 41.49 with current height and weight.   Constipation -- pt had BM today   Okay from vascular surgery for discharge. Left amputation surgical dressing change today. Right amputation stump wound VAC placed. Patient does not have any rehab days left. Plan is to discharge with home health. Patient is in agreement. I left message for daughter Aliece Vallejos on 02/23/23  D/c home  Procedures:left AKA, LHC Family communication :left message for Dter Adonis Brook Consults :Vascular, cardiology CODE STATUS:FULL  DVT Prophylaxis :eliquis      Pain control - [facility] Controlled Substance Reporting System database was reviewed. and patient was instructed, not to drive, operate heavy machinery, perform  activities at heights, swimming or participation in water activities or provide baby-sitting services while on Pain, Sleep and Anxiety Medications; until their outpatient Physician has advised to do so again. Also recommended to not to take more than prescribed Pain, Sleep and Anxiety Medications.  Disposition: Home health Diet recommendation:  Discharge Diet Orders (From admission, onward)     Start     Ordered   02/24/23 0000  Diet - low sodium heart healthy        02/24/23 1120           Cardiac diet DISCHARGE MEDICATION: Allergies as of 02/24/2023       Reactions   Gabapentin    Tremors    Bactrim [sulfamethoxazole-trimethoprim] Itching        Medication List     STOP taking these medications    alum & mag hydroxide-simeth 200-200-20 MG/5ML suspension Commonly known as: MAALOX/MYLANTA   bisacodyl 5 MG EC tablet Commonly known as: DULCOLAX   fluconazole 150 MG tablet Commonly known as: DIFLUCAN   metroNIDAZOLE 500 MG tablet Commonly known as: Flagyl   Nuzyra 150 MG Tabs Generic drug: Omadacycline Tosylate   oxyCODONE 5 MG immediate release tablet Commonly known as: Roxicodone   oxyCODONE-acetaminophen 5-325 MG tablet Commonly known as: PERCOCET/ROXICET   polyethylene glycol 17 g packet Commonly known as: MIRALAX / GLYCOLAX       TAKE these medications    apixaban 5 MG Tabs tablet Commonly known as: ELIQUIS Take 1 tablet (5 mg total) by mouth 2 (two) times daily.   ascorbic acid 500 MG tablet Commonly known as: VITAMIN C Take 1 tablet (500 mg total) by mouth 2 (two) times daily.   aspirin EC 81 MG tablet Take 81 mg by mouth daily. Swallow whole.   atorvastatin 80 MG tablet Commonly known as: LIPITOR Take 80 mg by mouth at bedtime.   citalopram 40 MG tablet Commonly known as: CeleXA Take 1 tablet (40 mg total) by mouth daily.   Combivent Respimat 20-100 MCG/ACT Aers respimat Generic drug: Ipratropium-Albuterol Inhale 1 puff into the  lungs every 6 (six) hours as needed.   cyanocobalamin 1000 MCG tablet Take 1 tablet (1,000 mcg total) by mouth daily. Start taking on: Feb 25, 2023   diphenoxylate-atropine 2.5-0.025 MG tablet Commonly known as: LOMOTIL Take 1 tablet by mouth 4 (four) times daily.   esomeprazole 20 MG capsule Commonly known as: NEXIUM Take 20 mg by mouth daily.   feeding supplement Liqd Take 237 mLs by mouth 2 (two) times daily between meals.   ferrous sulfate 325 (65 FE) MG tablet Take 325 mg by mouth daily.   HYDROcodone-acetaminophen 10-325 MG tablet Commonly known as: NORCO Take 1 tablet by mouth every 4 (four) hours as needed.   isosorbide mononitrate 120 MG 24 hr tablet Commonly known as: IMDUR Take 1 tablet (120 mg total) by mouth daily. Start taking on: Feb 25, 2023 What changed:  medication strength how much to take   lidocaine 5 % Commonly known as: LIDODERM Place 1 patch onto the skin daily. Remove & Discard patch within 12 hours or as directed by MD   metoprolol tartrate 50 MG tablet Commonly known as: LOPRESSOR Take 50 mg  by mouth 2 (two) times daily.   pregabalin 200 MG capsule Commonly known as: LYRICA Take 1 capsule (200 mg total) by mouth in the morning, at noon, and at bedtime.   zinc sulfate 220 (50 Zn) MG capsule Take 1 capsule (220 mg total) by mouth daily.               Discharge Care Instructions  (From admission, onward)           Start     Ordered   02/24/23 0000  Discharge wound care:       Comments: 02/23/23 1000    Negative pressure wound therapy  Every Mon-Wed-Fri 1000    Comments: WOC to change M-W-F; next due 02/26/2023 Question:  Amount of suction?  Answer:  125 mm/Hg  02/23/23 0935   02/24/23 1120            Follow-up Information     Georgiana Spinner, NP Follow up in 3 week(s).   Specialty: Vascular Surgery Why: Post op Follow up for staple removal and wound assessment of right lower extremity Contact information: [address] [place] [address] [phone]         Terri Piedra, PA-C. Schedule an appointment as soon as possible for a visit in 1 week(s).   Specialty: Physician Assistant Contact information: [address] [place] [address] [phone]                Discharge Exam: Filed Weights   02/17/23 0715 02/22/23 0600 02/24/23 0500  Weight: 102.9 kg 85 kg 90.3 kg   GENERAL:  62 y.o.-year-old patient with no acute distress.  LUNGS: Normal breath sounds bilaterally, no wheezing CARDIOVASCULAR: S1, S2 normal. No murmur   ABDOMEN: Soft, nontender, nondistended. Bowel sounds present.  EXTREMITIES: left AKA stump dressing +, right stump wound vac + NEUROLOGIC: nonfocal  patient is  awake  Condition at discharge: fair  The results of significant diagnostics from this hospitalization (including imaging, microbiology, ancillary and laboratory) are listed below for reference.   Imaging Studies: ECHOCARDIOGRAM COMPLETE  Result Date: 02/16/2023    ECHOCARDIOGRAM REPORT   Patient Name:   PERNA OKORO Date of Exam: 02/16/2023 Medical Rec #:  [id number]     Height:       62.0 in Accession #:    [id number]    Weight:       214.9 lb Date of Birth:  [birth year]      BSA:          1.971 m Patient Age:    62 years      BP:           133/74 mmHg Patient Gender: F             HR:           82 bpm. Exam Location:  [facility] Procedure: 2D Echo, Cardiac Doppler and Color Doppler Indications:     CAD, native vessel I25.10  History:         Patient has prior history of Echocardiogram examinations, most                  recent 06/15/2018. CHF; Risk Factors:Hypertension and                  Dyslipidemia.  Sonographer:     Cristela Blue Referring Phys:  [id number] DAVID W HARDING Diagnosing Phys: Julien Nordmann MD  Sonographer Comments: No parasternal  window. IMPRESSIONS  1. Left ventricular ejection fraction, by estimation, is 55 to 60%. The left ventricle has normal function. The left ventricle has  no regional wall motion abnormalities. There is mild left ventricular hypertrophy. Left ventricular diastolic parameters are consistent with Grade I diastolic dysfunction (impaired relaxation).  2. Right ventricular systolic function is normal. The right ventricular size is normal.  3. The mitral valve is normal in structure. No evidence of mitral valve regurgitation. No evidence of mitral stenosis.  4. The aortic valve is normal in structure. There is mild calcification of the aortic valve. Aortic valve regurgitation is not visualized. Aortic valve sclerosis is present, with no evidence of aortic valve stenosis.  5. The inferior vena cava is normal in size with greater than 50% respiratory variability, suggesting right atrial pressure of 3 mmHg. FINDINGS  Left Ventricle: Left ventricular ejection fraction, by estimation, is 55 to 60%. The left ventricle has normal function. The left ventricle has no regional wall motion abnormalities. The left ventricular internal cavity size was normal in size. There is  mild left ventricular hypertrophy. Left ventricular diastolic parameters are consistent with Grade I diastolic dysfunction (impaired relaxation). Right Ventricle: The right ventricular size is normal. No increase in right ventricular wall thickness. Right ventricular systolic function is normal. Left Atrium: Left atrial size was normal in size. Right Atrium: Right atrial size was normal in size. Pericardium: There is no evidence of pericardial effusion. Mitral Valve: The mitral valve is normal in structure. There is mild calcification of the mitral valve leaflet(s). No evidence of mitral valve regurgitation. No evidence of mitral valve stenosis. MV peak gradient, 3.2 mmHg. The mean mitral valve gradient  is 1.0 mmHg. Tricuspid Valve: The tricuspid valve is normal in structure. Tricuspid valve regurgitation is not demonstrated. No evidence of tricuspid stenosis. Aortic Valve: The aortic valve is normal in  structure. There is mild calcification of the aortic valve. Aortic valve regurgitation is not visualized. Aortic valve sclerosis is present, with no evidence of aortic valve stenosis. Aortic valve mean gradient  measures 3.0 mmHg. Aortic valve peak gradient measures 6.1 mmHg. Aortic valve area, by VTI measures 3.02 cm. Pulmonic Valve: The pulmonic valve was normal in structure. Pulmonic valve regurgitation is not visualized. No evidence of pulmonic stenosis. Aorta: The aortic root is normal in size and structure. Venous: The inferior vena cava is normal in size with greater than 50% respiratory variability, suggesting right atrial pressure of 3 mmHg. IAS/Shunts: No atrial level shunt detected by color flow Doppler.  LEFT VENTRICLE PLAX 2D LVIDd:         3.10 cm   Diastology LVIDs:         2.30 cm   LV e' medial:    7.18 cm/s LV PW:         1.20 cm   LV E/e' medial:  7.8 LV IVS:        1.50 cm   LV e' lateral:   11.10 cm/s LVOT diam:     1.90 cm   LV E/e' lateral: 5.1 LV SV:         61 LV SV Index:   31 LVOT Area:     2.84 cm  RIGHT VENTRICLE RV Basal diam:  2.70 cm RV Mid diam:    2.50 cm RV S prime:     13.90 cm/s TAPSE (M-mode): 1.3 cm LEFT ATRIUM           Index       RIGHT  ATRIUM          Index LA diam:      2.30 cm 1.17 cm/m  RA Area:     6.87 cm LA Vol (A2C): 17.8 ml 9.03 ml/m  RA Volume:   10.50 ml 5.33 ml/m LA Vol (A4C): 12.4 ml 6.29 ml/m  AORTIC VALVE AV Area (Vmax):    2.69 cm AV Area (Vmean):   2.89 cm AV Area (VTI):     3.02 cm AV Vmax:           123.50 cm/s AV Vmean:          81.200 cm/s AV VTI:            0.202 m AV Peak Grad:      6.1 mmHg AV Mean Grad:      3.0 mmHg LVOT Vmax:         117.00 cm/s LVOT Vmean:        82.700 cm/s LVOT VTI:          0.216 m LVOT/AV VTI ratio: 1.07  AORTA Ao Root diam: 2.40 cm MITRAL VALVE               TRICUSPID VALVE MV Area (PHT): 2.68 cm    TR Peak grad:   21.3 mmHg MV Area VTI:   2.47 cm    TR Vmax:        231.00 cm/s MV Peak grad:  3.2 mmHg MV Mean grad:   1.0 mmHg    SHUNTS MV Vmax:       0.90 m/s    Systemic VTI:  0.22 m MV Vmean:      52.3 cm/s   Systemic Diam: 1.90 cm MV Decel Time: 283 msec MV E velocity: 56.10 cm/s MV A velocity: 72.30 cm/s MV E/A ratio:  0.78 Julien Nordmann MD Electronically signed by Julien Nordmann MD Signature Date/Time: 02/16/2023/11:49:55 AM    Final    CARDIAC CATHETERIZATION  Result Date: 02/15/2023   Prox RCA lesion is 30% stenosed.   1st Diag lesion is 50% stenosed.   The left ventricular systolic function is normal.   LV end diastolic pressure is normal.   The left ventricular ejection fraction is greater than 65% by visual estimate.   There is no aortic valve stenosis. Post-Cath Findings Severe spasm of the proximal RCA existing lesion (roughly 30%) => catheter related spasm was 90% reduced to 30% after IC NTG Mild D1 disease but otherwise no significant CAD. Normal LVEF with no RWMA and normal LVEDP.   I was able to exchanged for a guide catheter and gives IC NTG and the 90+ percent lesion resolved to about 30%.  This indicates that she very well has some coronary spasm, but does not require PCI. => Recommendation would be to increase antispasm medications-I have increased Imdur to 120 mg.  Could also consider calcium channel blocker but will defer to her primary cardiologist.  She has an ongoing consult by vascular surgery and they are aware of the results. She will be admitted to the medicine service. She was on Eliquis preop, as I am not sure if she will need any procedures, I have not reordered Eliquis but it ordered for heparin and start 2 hours after TR band removal.  Patient's cardiologist is Dr. Juliann Pares from [facility], he is aware, but-would need for formal consult. I will continue her other home medication regimen.  She has lots of pain control medications but I will defer to the  hospitalist service after admission. She is on 80 mg atorvastatin.  At home was on 60 mg of Imdur which I will increase to 120 mg.   Continue aspirin and low-dose Lopressor-limited by blood pressure. Bryan Lemma, MD   Microbiology: Results for orders placed or performed in visit on 01/25/23  Aerobic culture     Status: Abnormal   Collection Time: 01/25/23  1:20 AM  Result Value Ref Range Status   Aerobic Bacterial Culture Final report (A)  Final   Organism ID, Bacteria Comment (A)  Final    Comment: Methicillin - resistant Staphylococcus aureus Based on resistance to oxacillin this isolate would be resistant to all currently available beta-lactam antimicrobial agents, with the exception of the newer cephalosporins with anti-MRSA activity, such as Ceftaroline Light growth    Organism ID, Bacteria Comment (A)  Final    Comment: Escherichia coli, identified by an automated biochemical system. Multi-Drug Resistant Organism Susceptibility profile is consistent with a probable ESBL. Heavy growth    Antimicrobial Susceptibility Comment  Final    Comment:       ** S = Susceptible; I = Intermediate; R = Resistant **                    P = Positive; N = Negative             MICS are expressed in micrograms per mL    Antibiotic                 RSLT#1    RSLT#2    RSLT#3    RSLT#4 Amoxicillin/Clavulanic Acid              S Ampicillin                               R Cefazolin                                R Cefepime                                 R Ceftriaxone                              R Cefuroxime                               R Ciprofloxacin                  R         R Clindamycin                    R Ertapenem                                S Erythromycin                   R Gentamicin                     S         S Imipenem  S Levofloxacin                   R         R Linezolid                      S Meropenem                                S Oxacillin                      R Penicillin                     R Piperacillin/Tazobactam                  S Rifampin                         S Tetracycline                   R         S Tobramycin                               S Trimethoprim/Sulfa             S         R Vancomycin                     S     Labs: CBC: Recent Labs  Lab 02/19/23 0421 02/20/23 0434 02/22/23 0512  WBC 6.4 7.1 15.6*  HGB 10.2* 10.0* 10.3*  HCT 34.1* 32.3* 33.5*  MCV 91.9 89.2 90.3  PLT 250 254 314   Basic Metabolic Panel: Recent Labs  Lab 02/19/23 0421  NA 139  K 3.8  CL 101  CO2 27  GLUCOSE 151*  BUN 9  CREATININE 0.68  CALCIUM 9.2    Discharge time spent: greater than 30 minutes.  Signed: [name], MD [facility] 02/24/2023

## 2023-02-24 NOTE — Discharge Instructions (Signed)
Wound vac right AKA per vascular instruions

## 2023-02-24 NOTE — TOC Transition Note (Addendum)
Transition of Care [facility]) - CM/SW Discharge Note   Patient Details  Name: Carla [name] MRN: [id number] Date of Birth: 29-Jul-1961  Transition of Care [facility]) CM/SW Contact:  Bing Quarry, RN Phone Number: 02/24/2023, 11:25 AM   Clinical Narrative: 5/4: POD #3 for LEFT AKA on 02/21/23 and on 5/2 wound vac placed on RIGHT Stump that underwent revision in February 2024 per provider notes. Does not have any STR days left and will discharge home with [facility] resumption with [facility] for PT/OT/RN and orders in. Has [employer], [facility], hospital bed at home. CSW spoke with patient regarding this situation and discharge plan to home with [facility]. Daughter, Carla [name], was contacted by provider on 01/24/23 to discuss plan. A VM was also left with daughter on 02/23/23 at 2 pm. Pending discharge today possibly. [facility] notified via [place]. Wound vac in place on RIGHT stump. Will need EMS transport to home per provider due to bilateral AKA, wound vac, prior conditions. Will set up EMS transport and provider EMS forms to unit RN.  Gabriel Cirri RN CM (Weekends only) [phone].   [name],Carla (Daughter) [phone] (Mobile)   200 pm: Son in law to pick up patient from [facility] and transport to home as [facility] is unable to transport. Gabriel Cirri RN CM   325: EMS showed up for patient transport to home address per Unit RN. Gabriel Cirri RN CM   Final next level of care: Home w Home Health Services Barriers to Discharge: Barriers Resolved   Patient Goals and CMS Choice      Discharge Placement                  Patient to be transferred to facility by: [facility] to Home address (Patient will need EMS transport to home due to bilateral AKA, wound vac in place on RIGHT.)   Patient and family notified of of transfer: 02/24/23  Discharge Plan and Services Additional resources added to the After Visit Summary for     Discharge Planning Services: CM Consult            DME Arranged: Negative pressure wound device DME Agency:  [facility] Date DME Agency Contacted: 02/22/23 Time DME Agency Contacted: 1509 Representative spoke with at DME Agency: [facility] Arranged: RN, PT, OT [facility] Agency: [facility] Date HH Agency Contacted: 02/24/23 Time HH Agency Contacted: 1124 Representative spoke with at [facility] Agency: Carla [name]  Social Determinants of Health (SDOH) Interventions SDOH Screenings   Food Insecurity: No Food Insecurity (01/17/2023)  Housing: Low Risk  (01/17/2023)  Transportation Needs: No Transportation Needs (01/17/2023)  Utilities: Not At Risk (01/17/2023)  Depression (PHQ2-9): Low Risk  (01/25/2023)  Tobacco Use: Medium Risk (02/22/2023)     Readmission Risk Interventions    12/06/2022    1:31 PM 07/25/2022   10:49 AM 05/05/2022   10:52 AM  Readmission Risk Prevention Plan  Transportation Screening Complete Complete Complete  Medication Review Oceanographer) Complete Complete Complete  PCP or Specialist appointment within 3-5 days of discharge  Complete Complete  HRI or Home Care Consult  Complete   SW Recovery Care/Counseling Consult Complete Not Complete Complete  SW Consult Not Complete Comments  NA   Palliative Care Screening Not Applicable  Not Applicable  Skilled Nursing Facility  Patient Refused Patient Refused

## 2023-02-24 NOTE — Progress Notes (Signed)
Reviewed AVS instructions, new medications, pharmacy pick-up location, and follow-up information with patient.  Patient questions answered and patient voiced understanding.  No additional concerns at this time.  Wound vac removed and wet-to-dry dressing placed.  IV removed without incidence.  Patient awaiting EMS for discharge to home.

## 2023-02-26 LAB — SURGICAL PATHOLOGY

## 2023-03-01 ENCOUNTER — Telehealth (INDEPENDENT_AMBULATORY_CARE_PROVIDER_SITE_OTHER): Payer: Self-pay

## 2023-03-01 NOTE — Telephone Encounter (Signed)
For the amputation site, they should place xeroform over the incision covered with gauze and ace bandage.  I'm ok with those verbal orders

## 2023-03-01 NOTE — Telephone Encounter (Signed)
Left a detailed message for Carla [name] with instructions and let her know to call us with any questions.

## 2023-03-08 ENCOUNTER — Telehealth (INDEPENDENT_AMBULATORY_CARE_PROVIDER_SITE_OTHER): Payer: Self-pay

## 2023-03-08 ENCOUNTER — Other Ambulatory Visit (INDEPENDENT_AMBULATORY_CARE_PROVIDER_SITE_OTHER): Payer: Self-pay | Admitting: Nurse Practitioner

## 2023-03-08 MED ORDER — HYDROCODONE-ACETAMINOPHEN 10-325 MG PO TABS: 1.0000 | ORAL_TABLET | Freq: Four times a day (QID) | ORAL | 0 refills | Status: DC | PRN

## 2023-03-08 MED ORDER — NICOTINE 21 MG/24HR TD PT24: 21.0000 mg | MEDICATED_PATCH | Freq: Every day | TRANSDERMAL | 1 refills | Status: DC

## 2023-03-08 NOTE — Telephone Encounter (Signed)
Patient was notified with medical recommendations

## 2023-03-08 NOTE — Telephone Encounter (Signed)
I spoke with the patient and she informed that she having pain with the left AKA at 10 out of 10. Patient is requesting for pain medication to help with relief and also requesting nicotine patches to help stop smoking . Patient was being seen by a pain doctor but she was recently release from office. Patient stated that she was contacting the [facility] pain clinic to schedule a appointment. Please Advise and requesting medications to be sent to [facility]

## 2023-03-08 NOTE — Telephone Encounter (Signed)
I have sent in pain medication however she needs to re-establish with a pain clinic as we will not be able to provide long term pain relief.  Additionally I have sent in the Rx for patches.

## 2023-03-15 ENCOUNTER — Telehealth (INDEPENDENT_AMBULATORY_CARE_PROVIDER_SITE_OTHER): Payer: Self-pay

## 2023-03-15 NOTE — Telephone Encounter (Signed)
Patient contacted the office stating that nurse was not able to use wound vac on yesterday and place a wet to dry dressing over wound. The wound is to small for the wound vac sponge. Patient also is having a pain level above 10 with left AKA and bruising. Please Advise

## 2023-03-16 NOTE — Telephone Encounter (Signed)
Carla [name] from [facility] left a message informing that the surround skin around the wound has a whitish rash possible yeast. The nurse states that the depth of the wound has decreased. Also she informed the patient did decline the wound vac. I informed [name] NP with information and she is fine with nurse continuing wet to dry. Also she should monitor the area.  I left a message on the nurse voicemail to return a call back to the office.

## 2023-03-21 ENCOUNTER — Emergency Department: Payer: 59

## 2023-03-21 ENCOUNTER — Encounter (INDEPENDENT_AMBULATORY_CARE_PROVIDER_SITE_OTHER): Payer: Self-pay | Admitting: Nurse Practitioner

## 2023-03-21 ENCOUNTER — Encounter: Payer: Self-pay | Admitting: Internal Medicine

## 2023-03-21 ENCOUNTER — Inpatient Hospital Stay
Admission: EM | Admit: 2023-03-21 | Discharge: 2023-04-17 | DRG: 464 | Disposition: A | Discharge status: Home / Self Care | Payer: 59 | Attending: Internal Medicine | Admitting: Internal Medicine

## 2023-03-21 ENCOUNTER — Inpatient Hospital Stay: Payer: 59

## 2023-03-21 ENCOUNTER — Other Ambulatory Visit: Payer: Self-pay

## 2023-03-21 ENCOUNTER — Ambulatory Visit (INDEPENDENT_AMBULATORY_CARE_PROVIDER_SITE_OTHER): Payer: 59 | Admitting: Nurse Practitioner

## 2023-03-21 VITALS — BP 155/88 | HR 67 | Resp 16

## 2023-03-21 DIAGNOSIS — I480 Paroxysmal atrial fibrillation: Secondary | ICD-10-CM | POA: Diagnosis not present

## 2023-03-21 DIAGNOSIS — E669 Obesity, unspecified: Secondary | ICD-10-CM | POA: Diagnosis present

## 2023-03-21 DIAGNOSIS — A4902 Methicillin resistant Staphylococcus aureus infection, unspecified site: Secondary | ICD-10-CM | POA: Diagnosis not present

## 2023-03-21 DIAGNOSIS — Z89611 Acquired absence of right leg above knee: Secondary | ICD-10-CM

## 2023-03-21 DIAGNOSIS — I110 Hypertensive heart disease with heart failure: Secondary | ICD-10-CM | POA: Diagnosis present

## 2023-03-21 DIAGNOSIS — A498 Other bacterial infections of unspecified site: Secondary | ICD-10-CM

## 2023-03-21 DIAGNOSIS — I959 Hypotension, unspecified: Secondary | ICD-10-CM | POA: Diagnosis present

## 2023-03-21 DIAGNOSIS — D62 Acute posthemorrhagic anemia: Secondary | ICD-10-CM | POA: Diagnosis not present

## 2023-03-21 DIAGNOSIS — I10 Essential (primary) hypertension: Secondary | ICD-10-CM | POA: Diagnosis not present

## 2023-03-21 DIAGNOSIS — B9562 Methicillin resistant Staphylococcus aureus infection as the cause of diseases classified elsewhere: Secondary | ICD-10-CM | POA: Diagnosis not present

## 2023-03-21 DIAGNOSIS — L8990 Pressure ulcer of unspecified site, unspecified stage: Secondary | ICD-10-CM | POA: Diagnosis present

## 2023-03-21 DIAGNOSIS — B964 Proteus (mirabilis) (morganii) as the cause of diseases classified elsewhere: Secondary | ICD-10-CM | POA: Diagnosis not present

## 2023-03-21 DIAGNOSIS — I700 Atherosclerosis of aorta: Secondary | ICD-10-CM | POA: Diagnosis present

## 2023-03-21 DIAGNOSIS — Z9889 Other specified postprocedural states: Secondary | ICD-10-CM | POA: Diagnosis not present

## 2023-03-21 DIAGNOSIS — Z95820 Peripheral vascular angioplasty status with implants and grafts: Secondary | ICD-10-CM

## 2023-03-21 DIAGNOSIS — Z1612 Extended spectrum beta lactamase (ESBL) resistance: Secondary | ICD-10-CM | POA: Diagnosis present

## 2023-03-21 DIAGNOSIS — K219 Gastro-esophageal reflux disease without esophagitis: Secondary | ICD-10-CM | POA: Diagnosis present

## 2023-03-21 DIAGNOSIS — Y835 Amputation of limb(s) as the cause of abnormal reaction of the patient, or of later complication, without mention of misadventure at the time of the procedure: Secondary | ICD-10-CM | POA: Diagnosis present

## 2023-03-21 DIAGNOSIS — Z6832 Body mass index (BMI) 32.0-32.9, adult: Secondary | ICD-10-CM | POA: Diagnosis not present

## 2023-03-21 DIAGNOSIS — E785 Hyperlipidemia, unspecified: Secondary | ICD-10-CM | POA: Diagnosis present

## 2023-03-21 DIAGNOSIS — Z89612 Acquired absence of left leg above knee: Secondary | ICD-10-CM | POA: Diagnosis not present

## 2023-03-21 DIAGNOSIS — T8754 Necrosis of amputation stump, left lower extremity: Secondary | ICD-10-CM | POA: Diagnosis present

## 2023-03-21 DIAGNOSIS — E876 Hypokalemia: Secondary | ICD-10-CM | POA: Diagnosis not present

## 2023-03-21 DIAGNOSIS — M79652 Pain in left thigh: Secondary | ICD-10-CM | POA: Insufficient documentation

## 2023-03-21 DIAGNOSIS — B9620 Unspecified Escherichia coli [E. coli] as the cause of diseases classified elsewhere: Secondary | ICD-10-CM | POA: Diagnosis not present

## 2023-03-21 DIAGNOSIS — L089 Local infection of the skin and subcutaneous tissue, unspecified: Principal | ICD-10-CM

## 2023-03-21 DIAGNOSIS — F32A Depression, unspecified: Secondary | ICD-10-CM | POA: Diagnosis present

## 2023-03-21 DIAGNOSIS — I739 Peripheral vascular disease, unspecified: Secondary | ICD-10-CM | POA: Diagnosis present

## 2023-03-21 DIAGNOSIS — Z87891 Personal history of nicotine dependence: Secondary | ICD-10-CM | POA: Diagnosis not present

## 2023-03-21 DIAGNOSIS — I2510 Atherosclerotic heart disease of native coronary artery without angina pectoris: Secondary | ICD-10-CM | POA: Diagnosis present

## 2023-03-21 DIAGNOSIS — D508 Other iron deficiency anemias: Secondary | ICD-10-CM | POA: Diagnosis not present

## 2023-03-21 DIAGNOSIS — T8781 Dehiscence of amputation stump: Secondary | ICD-10-CM | POA: Diagnosis not present

## 2023-03-21 DIAGNOSIS — T8789 Other complications of amputation stump: Secondary | ICD-10-CM | POA: Diagnosis not present

## 2023-03-21 DIAGNOSIS — G894 Chronic pain syndrome: Secondary | ICD-10-CM | POA: Diagnosis present

## 2023-03-21 DIAGNOSIS — Z8616 Personal history of COVID-19: Secondary | ICD-10-CM | POA: Diagnosis not present

## 2023-03-21 DIAGNOSIS — D509 Iron deficiency anemia, unspecified: Secondary | ICD-10-CM | POA: Diagnosis present

## 2023-03-21 DIAGNOSIS — T148XXA Other injury of unspecified body region, initial encounter: Secondary | ICD-10-CM | POA: Diagnosis present

## 2023-03-21 DIAGNOSIS — I252 Old myocardial infarction: Secondary | ICD-10-CM

## 2023-03-21 DIAGNOSIS — I5032 Chronic diastolic (congestive) heart failure: Secondary | ICD-10-CM | POA: Diagnosis present

## 2023-03-21 DIAGNOSIS — I9589 Other hypotension: Secondary | ICD-10-CM | POA: Diagnosis not present

## 2023-03-21 DIAGNOSIS — J439 Emphysema, unspecified: Secondary | ICD-10-CM | POA: Diagnosis not present

## 2023-03-21 DIAGNOSIS — F17200 Nicotine dependence, unspecified, uncomplicated: Secondary | ICD-10-CM | POA: Diagnosis present

## 2023-03-21 DIAGNOSIS — Z888 Allergy status to other drugs, medicaments and biological substances status: Secondary | ICD-10-CM

## 2023-03-21 DIAGNOSIS — I952 Hypotension due to drugs: Secondary | ICD-10-CM | POA: Diagnosis not present

## 2023-03-21 DIAGNOSIS — F419 Anxiety disorder, unspecified: Secondary | ICD-10-CM | POA: Diagnosis present

## 2023-03-21 DIAGNOSIS — Z7901 Long term (current) use of anticoagulants: Secondary | ICD-10-CM

## 2023-03-21 DIAGNOSIS — T8740 Infection of amputation stump, unspecified extremity: Secondary | ICD-10-CM | POA: Diagnosis not present

## 2023-03-21 DIAGNOSIS — Z79899 Other long term (current) drug therapy: Secondary | ICD-10-CM | POA: Diagnosis not present

## 2023-03-21 DIAGNOSIS — L89322 Pressure ulcer of left buttock, stage 2: Secondary | ICD-10-CM | POA: Diagnosis present

## 2023-03-21 DIAGNOSIS — Z8619 Personal history of other infectious and parasitic diseases: Secondary | ICD-10-CM | POA: Diagnosis not present

## 2023-03-21 DIAGNOSIS — J449 Chronic obstructive pulmonary disease, unspecified: Secondary | ICD-10-CM | POA: Diagnosis present

## 2023-03-21 DIAGNOSIS — Z85828 Personal history of other malignant neoplasm of skin: Secondary | ICD-10-CM

## 2023-03-21 DIAGNOSIS — T3995XA Adverse effect of unspecified nonopioid analgesic, antipyretic and antirheumatic, initial encounter: Secondary | ICD-10-CM | POA: Diagnosis not present

## 2023-03-21 DIAGNOSIS — Z8249 Family history of ischemic heart disease and other diseases of the circulatory system: Secondary | ICD-10-CM

## 2023-03-21 DIAGNOSIS — Z96651 Presence of right artificial knee joint: Secondary | ICD-10-CM | POA: Diagnosis present

## 2023-03-21 DIAGNOSIS — T8744 Infection of amputation stump, left lower extremity: Principal | ICD-10-CM | POA: Diagnosis present

## 2023-03-21 DIAGNOSIS — S78112A Complete traumatic amputation at level between left hip and knee, initial encounter: Secondary | ICD-10-CM

## 2023-03-21 DIAGNOSIS — F339 Major depressive disorder, recurrent, unspecified: Secondary | ICD-10-CM | POA: Diagnosis present

## 2023-03-21 DIAGNOSIS — Z7982 Long term (current) use of aspirin: Secondary | ICD-10-CM

## 2023-03-21 DIAGNOSIS — F17210 Nicotine dependence, cigarettes, uncomplicated: Secondary | ICD-10-CM | POA: Diagnosis not present

## 2023-03-21 DIAGNOSIS — F4322 Adjustment disorder with anxiety: Secondary | ICD-10-CM | POA: Diagnosis present

## 2023-03-21 DIAGNOSIS — Z9049 Acquired absence of other specified parts of digestive tract: Secondary | ICD-10-CM

## 2023-03-21 DIAGNOSIS — Z560 Unemployment, unspecified: Secondary | ICD-10-CM

## 2023-03-21 DIAGNOSIS — L03116 Cellulitis of left lower limb: Secondary | ICD-10-CM | POA: Diagnosis present

## 2023-03-21 DIAGNOSIS — G510 Bell's palsy: Secondary | ICD-10-CM | POA: Diagnosis present

## 2023-03-21 DIAGNOSIS — T85693A Other mechanical complication of artificial skin graft and decellularized allodermis, initial encounter: Secondary | ICD-10-CM | POA: Diagnosis not present

## 2023-03-21 DIAGNOSIS — T8743 Infection of amputation stump, right lower extremity: Secondary | ICD-10-CM | POA: Diagnosis not present

## 2023-03-21 LAB — APTT: aPTT: 34 seconds (ref 24–36)

## 2023-03-21 LAB — CBC
HCT: 40.9 % (ref 36.0–46.0)
Hemoglobin: 12.8 g/dL (ref 12.0–15.0)
MCH: 26.8 pg (ref 26.0–34.0)
MCHC: 31.3 g/dL (ref 30.0–36.0)
MCV: 85.7 fL (ref 80.0–100.0)
Platelets: 421 10*3/uL — ABNORMAL HIGH (ref 150–400)
RBC: 4.77 MIL/uL (ref 3.87–5.11)
RDW: 15.7 % — ABNORMAL HIGH (ref 11.5–15.5)
WBC: 7.5 10*3/uL (ref 4.0–10.5)
nRBC: 0 % (ref 0.0–0.2)

## 2023-03-21 LAB — SEDIMENTATION RATE: Sed Rate: 60 mm/h — ABNORMAL HIGH (ref 0–30)

## 2023-03-21 LAB — BASIC METABOLIC PANEL
Anion gap: 14 (ref 5–15)
BUN: 16 mg/dL (ref 8–23)
CO2: 19 mmol/L — ABNORMAL LOW (ref 22–32)
Calcium: 9.6 mg/dL (ref 8.9–10.3)
Chloride: 104 mmol/L (ref 98–111)
Creatinine, Ser: 0.64 mg/dL (ref 0.44–1.00)
GFR, Estimated: >60 mL/min (ref >60)
Glucose, Bld: 121 mg/dL — ABNORMAL HIGH (ref 70–99)
Potassium: 3.6 mmol/L (ref 3.5–5.1)
Sodium: 137 mmol/L (ref 135–145)

## 2023-03-21 LAB — PROTIME-INR
INR: 1.6 — ABNORMAL HIGH (ref 0.8–1.2)
Prothrombin Time: 19.6 s — ABNORMAL HIGH (ref 11.4–15.2)

## 2023-03-21 LAB — C-REACTIVE PROTEIN: CRP: 1.2 mg/dL — ABNORMAL HIGH (ref <1.0)

## 2023-03-21 LAB — LACTIC ACID, PLASMA: Lactic Acid, Venous: 1.2 mmol/L (ref 0.5–1.9)

## 2023-03-21 MED ORDER — PIPERACILLIN-TAZOBACTAM 3.375 G IVPB 30 MIN
3.3750 g | Freq: Once | INTRAVENOUS | Status: AC
  Administered 2023-03-21: 3.375 g via INTRAVENOUS
  Filled 2023-03-21: qty 50

## 2023-03-21 MED ORDER — VANCOMYCIN HCL 1500 MG/300ML IV SOLN
1500.0000 mg | INTRAVENOUS | Status: DC
  Administered 2023-03-22 – 2023-03-23 (×2): 1500 mg via INTRAVENOUS
  Filled 2023-03-21 (×3): qty 300

## 2023-03-21 MED ORDER — LACTATED RINGERS IV SOLN: INTRAVENOUS | Status: AC

## 2023-03-21 MED ORDER — SENNOSIDES-DOCUSATE SODIUM 8.6-50 MG PO TABS: 1.0000 | ORAL_TABLET | Freq: Every evening | ORAL | Status: DC | PRN

## 2023-03-21 MED ORDER — HYDROMORPHONE HCL 1 MG/ML IJ SOLN
0.5000 mg | INTRAMUSCULAR | Status: AC | PRN
  Administered 2023-03-21 – 2023-03-22 (×2): 0.5 mg via INTRAVENOUS
  Filled 2023-03-21 (×2): qty 0.5

## 2023-03-21 MED ORDER — ISOSORBIDE MONONITRATE ER 30 MG PO TB24
120.0000 mg | ORAL_TABLET | Freq: Every day | ORAL | Status: DC
  Administered 2023-03-23 – 2023-04-17 (×22): 120 mg via ORAL
  Filled 2023-03-21: qty 2
  Filled 2023-03-21: qty 4
  Filled 2023-03-21 (×2): qty 2
  Filled 2023-03-21 (×2): qty 4
  Filled 2023-03-21: qty 2
  Filled 2023-03-21 (×2): qty 4
  Filled 2023-03-21: qty 2
  Filled 2023-03-21 (×3): qty 4
  Filled 2023-03-21: qty 2
  Filled 2023-03-21 (×2): qty 4
  Filled 2023-03-21: qty 2
  Filled 2023-03-21: qty 4
  Filled 2023-03-21 (×2): qty 2
  Filled 2023-03-21: qty 4
  Filled 2023-03-21: qty 2
  Filled 2023-03-21 (×2): qty 4
  Filled 2023-03-21 (×2): qty 2

## 2023-03-21 MED ORDER — IPRATROPIUM-ALBUTEROL 0.5-2.5 (3) MG/3ML IN SOLN: 3.0000 mL | Freq: Four times a day (QID) | RESPIRATORY_TRACT | Status: DC | PRN

## 2023-03-21 MED ORDER — METOPROLOL TARTRATE 50 MG PO TABS
50.0000 mg | ORAL_TABLET | Freq: Two times a day (BID) | ORAL | Status: DC
  Administered 2023-03-21 – 2023-03-24 (×6): 50 mg via ORAL
  Filled 2023-03-21 (×3): qty 1
  Filled 2023-03-21: qty 2
  Filled 2023-03-21: qty 1
  Filled 2023-03-21: qty 2

## 2023-03-21 MED ORDER — ACETAMINOPHEN 650 MG RE SUPP: 650.0000 mg | Freq: Four times a day (QID) | RECTAL | Status: DC | PRN

## 2023-03-21 MED ORDER — ZINC SULFATE 220 (50 ZN) MG PO CAPS
220.0000 mg | ORAL_CAPSULE | Freq: Every day | ORAL | Status: DC
  Administered 2023-03-21 – 2023-04-17 (×27): 220 mg via ORAL
  Filled 2023-03-21 (×27): qty 1

## 2023-03-21 MED ORDER — VANCOMYCIN HCL IN DEXTROSE 1-5 GM/200ML-% IV SOLN: 1000.0000 mg | Freq: Once | INTRAVENOUS | Status: DC

## 2023-03-21 MED ORDER — ONDANSETRON HCL 4 MG/2ML IJ SOLN
4.0000 mg | Freq: Four times a day (QID) | INTRAMUSCULAR | Status: AC | PRN
  Administered 2023-03-22: 4 mg via INTRAVENOUS
  Filled 2023-03-21: qty 2

## 2023-03-21 MED ORDER — FENTANYL CITRATE PF 50 MCG/ML IJ SOSY: 50.0000 ug | PREFILLED_SYRINGE | Freq: Once | INTRAMUSCULAR | Status: DC

## 2023-03-21 MED ORDER — MORPHINE SULFATE (PF) 4 MG/ML IV SOLN
4.0000 mg | INTRAVENOUS | Status: AC | PRN
  Administered 2023-03-21 – 2023-03-22 (×2): 4 mg via INTRAVENOUS
  Filled 2023-03-21 (×2): qty 1

## 2023-03-21 MED ORDER — DIPHENOXYLATE-ATROPINE 2.5-0.025 MG PO TABS: 1.0000 | ORAL_TABLET | Freq: Four times a day (QID) | ORAL | Status: DC | PRN

## 2023-03-21 MED ORDER — ACETAMINOPHEN 325 MG PO TABS: 650.0000 mg | ORAL_TABLET | Freq: Four times a day (QID) | ORAL | Status: DC | PRN

## 2023-03-21 MED ORDER — HEPARIN SODIUM (PORCINE) 5000 UNIT/ML IJ SOLN
5000.0000 [IU] | Freq: Three times a day (TID) | INTRAMUSCULAR | Status: AC
  Administered 2023-03-21: 5000 [IU] via SUBCUTANEOUS
  Filled 2023-03-21: qty 1

## 2023-03-21 MED ORDER — SODIUM CHLORIDE 0.9 % IV BOLUS
500.0000 mL | Freq: Once | INTRAVENOUS | Status: AC
  Administered 2023-03-21: 500 mL via INTRAVENOUS

## 2023-03-21 MED ORDER — HYDROMORPHONE HCL 1 MG/ML IJ SOLN
0.5000 mg | Freq: Once | INTRAMUSCULAR | Status: AC
  Administered 2023-03-21: 0.5 mg via INTRAVENOUS
  Filled 2023-03-21: qty 0.5

## 2023-03-21 MED ORDER — SODIUM CHLORIDE 0.9 % IV SOLN
2.0000 g | Freq: Three times a day (TID) | INTRAVENOUS | Status: DC
  Administered 2023-03-21 – 2023-03-24 (×8): 2 g via INTRAVENOUS
  Filled 2023-03-21 (×9): qty 12.5

## 2023-03-21 MED ORDER — HEPARIN SODIUM (PORCINE) 5000 UNIT/ML IJ SOLN
5000.0000 [IU] | Freq: Three times a day (TID) | INTRAMUSCULAR | Status: DC
Start: 2023-03-22 — End: 2023-03-21

## 2023-03-21 MED ORDER — ENSURE ENLIVE PO LIQD
237.0000 mL | Freq: Two times a day (BID) | ORAL | Status: DC
  Administered 2023-03-22 – 2023-04-17 (×45): 237 mL via ORAL

## 2023-03-21 MED ORDER — VANCOMYCIN HCL 1750 MG/350ML IV SOLN
1750.0000 mg | Freq: Once | INTRAVENOUS | Status: AC
  Administered 2023-03-21: 1750 mg via INTRAVENOUS
  Filled 2023-03-21: qty 350

## 2023-03-21 MED ORDER — PANTOPRAZOLE SODIUM 40 MG PO TBEC
40.0000 mg | DELAYED_RELEASE_TABLET | Freq: Every day | ORAL | Status: DC
  Administered 2023-03-22 – 2023-04-17 (×27): 40 mg via ORAL
  Filled 2023-03-21 (×27): qty 1

## 2023-03-21 MED ORDER — IOHEXOL 300 MG/ML  SOLN
100.0000 mL | Freq: Once | INTRAMUSCULAR | Status: AC | PRN
  Administered 2023-03-21: 100 mL via INTRAVENOUS

## 2023-03-21 MED ORDER — VITAMIN C 500 MG PO TABS
500.0000 mg | ORAL_TABLET | Freq: Two times a day (BID) | ORAL | Status: DC
  Administered 2023-03-21 – 2023-04-17 (×54): 500 mg via ORAL
  Filled 2023-03-21 (×54): qty 1

## 2023-03-21 MED ORDER — ONDANSETRON HCL 4 MG PO TABS: 4.0000 mg | ORAL_TABLET | Freq: Four times a day (QID) | ORAL | Status: AC | PRN

## 2023-03-21 MED ORDER — HYDRALAZINE HCL 20 MG/ML IJ SOLN: 5.0000 mg | Freq: Four times a day (QID) | INTRAMUSCULAR | Status: AC | PRN

## 2023-03-21 MED ORDER — ATORVASTATIN CALCIUM 80 MG PO TABS
80.0000 mg | ORAL_TABLET | Freq: Every day | ORAL | Status: DC
  Administered 2023-03-21 – 2023-03-27 (×7): 80 mg via ORAL
  Filled 2023-03-21 (×2): qty 1
  Filled 2023-03-21: qty 4
  Filled 2023-03-21 (×4): qty 1

## 2023-03-21 NOTE — Assessment & Plan Note (Addendum)
5/30 - taken to OR for debridement and wound vac placement 6/1 - wound cultures growing ESBL E coli 6/5 -went to the OR to change wound VAC and debridement 6/5.  Wound culture growing MRSA and Proteus 6/10.  Irrigation debridement of left AKA stump. -Patient currently on ertapenem and daptomycin.  Plan will be ertapenem and daptomycin upon discharge.  Antibiotics will go through 7/12. -Pain control.  Continue oxycodone to 15 mg every 6 hours as needed. 6/24.  Wound VAC change in the operating room.  Vascular surgery thinks that the wound looks good.  They plan on taking him back to the operating room for the next 2 Mondays to change wound VAC. 6/25.  Wound care nurse connected the home wound VAC machine tubing to be wound VAC already present on her leg.  Antibiotics were delivered to her room.  Pain medications E scribed into pharmacy and I did speak with the pharmacist about these medications (Xtampza and oxycodone).

## 2023-03-21 NOTE — Assessment & Plan Note (Addendum)
Continue home Imdur, metoprolol

## 2023-03-21 NOTE — Hospital Course (Addendum)
Ms. Reegan Mctighe is a 62 year old female with history of depression, anxiety, hyperlipidemia, hypertension, tobacco use, paroxysmal atrial fibrillation on Eliquis, obesity, atherosclerosis, CAD, PAD, history of left stump infection post amputation, who presented on 03/21/2023 from vascular outpatient for due to necrosis of the left AKA stump.  Pt was admitted and started on IV antibiotics. Vascular surgery was consulted and took patient to the OR on 5/30 for debridement and wound vac placement.   6/5 status postdebridement by vascular surgery in the operating room. 6/10.  Irrigation debridement of skin and soft tissue done by vascular surgery.  6/19.  Vascular surgery planning to take back to the operating room on 6/24 to change wound VAC.  Increased oral oxycodone to 15 mg. 6/20.  Pain seems better controlled but still having pain.  Continue same regimen today. 621.  Patient still having a lot of pain but wants to continue the same pain regimen.

## 2023-03-21 NOTE — Assessment & Plan Note (Deleted)
With elevated sed rate Continue with vancomycin and cefepime per pharmacy N.p.o. after midnight Vascular surgery has been consulted, plan for debridement in the a.m. Admit to telemetry cardiac, inpatient

## 2023-03-21 NOTE — Consult Note (Signed)
PHARMACY -  BRIEF ANTIBIOTIC NOTE   Pharmacy has received consult(s) for Vancomycin from an ED provider.  The patient's profile has been reviewed for ht/wt/allergies/indication/available labs.    One time order(s) placed for Vancomycin 1750mg  IV x 1 dose  Further antibiotics/pharmacy consults should be ordered by admitting physician if indicated.                       Thank you, Sabino Denning Rodriguez-Guzman PharmD, BCPS 03/21/2023 4:43 PM

## 2023-03-21 NOTE — Consult Note (Signed)
CODE SEPSIS - PHARMACY COMMUNICATION  **Broad Spectrum Antibiotics should be administered within 1 hour of Sepsis diagnosis**  Time Code Sepsis Called/Page Received: 1644  Antibiotics Ordered: Vancomycin and Zosyn  Time of 1st antibiotic administration: 1656  Additional action taken by pharmacy: none  If necessary, Name of Provider/Nurse Contacted: n/a   Wyman Meschke Rodriguez-Guzman PharmD, BCPS 03/21/2023 4:45 PM

## 2023-03-21 NOTE — H&P (Signed)
History and Physical   Carla [name] ZOX:[id number] DOB: 1961-05-21 DOA: 03/21/2023  PCP: [facility]  Outpatient Specialists: Dr. Wyn Quaker, vascular Patient coming from: Outpatient past clinic  I have personally briefly reviewed patient's old medical records in [facility] EMR.  Chief Concern: Left thigh stump infection  HPI: Carla [name] is a 62 year old female with history of depression, anxiety, hyperlipidemia, hypertension, tobacco use, paroxysmal atrial fibrillation on Eliquis, obesity, atherosclerosis, CAD, PAD, history of left stump infection post amputation, who presents emergency department from vascular outpatient for chief concerns of necrosis at the left thigh stump.  Vitals in the ED showed temperature of 97.9, respiration rate of 16, heart rate of 67, blood pressure 155/88, SpO2 of 95% on room air.  Serum sodium is 137, potassium 3.6, chloride 104, bicarb 19, BUN of 16, serum creatinine of 0.64, EGFR greater than 60, nonfasting blood glucose 121, WBC 7.5, hemoglobin 12.8, platelets of 421.  Sed rate was elevated at 60.  ED treatment: Fentanyl 50 mcg one-time dose, Dilaudid 0.5 mg IV 2 doses were ordered, vancomycin, Zosyn, sodium chloride 500 mL bolus. --------------------- At bedside, at bedside patient was able to tell me her name, age, location, current calendar year.  She denies fever, chills at home.  She reports the wound has been worsening over the past, opening, with purulent drainage over the past several days.  She states that the home nurse who helped her states that the skin is just dead skin.  She endorses nausea and denies vomiting.  She endorses baseline loose bowel movements about 4 times per day.  She denies chest pain, shortness of breath, dysuria, hematuria.  Social history: She lives at home with her daughter and son-in-law.  She has quit smoking, last cigarette was 2 to 3 weeks ago.  She denies EtOH and recreational drug use.  She is  retired and formerly worked in the [employer]  ROS: Constitutional: no weight change, no fever ENT/Mouth: no sore throat, no rhinorrhea Eyes: no eye pain, no vision changes Cardiovascular: no chest pain, no dyspnea,  no edema, no palpitations Respiratory: no cough, no sputum, no wheezing Gastrointestinal: + nausea, no vomiting, no diarrhea, no constipation Genitourinary: no urinary incontinence, no dysuria, no hematuria Musculoskeletal: no arthralgias, no myalgias Skin: + skin lesions with odor and purulence, no pruritus, Neuro: + weakness, no loss of consciousness, no syncope Psych: no anxiety, no depression, no decrease appetite Heme/Lymph: no bruising, no bleeding  ED Course: Discussed with emergency medicine provider, patient requiring hospitalization for chief concerns of left thigh stump infection post AKA.  Assessment/Plan  Principal Problem:   Cellulitis of left thigh Active Problems:   Chronic anticoagulation   PVD (peripheral vascular disease) (HCC)   Amputation stump infection (HCC)   Paroxysmal atrial fibrillation (HCC)   COPD (chronic obstructive pulmonary disease) (HCC)   Recurrent major depressive disorder (HCC)   Essential hypertension   GERD (gastroesophageal reflux disease)   Chronic pain syndrome   Adjustment disorder with anxiety   Anxiety and depression   Left thigh pain   Assessment and Plan:  * Cellulitis of left thigh With elevated sed rate Continue with vancomycin and cefepime per pharmacy N.p.o. after midnight Vascular surgery has been consulted, plan for debridement in the a.m. Admit to telemetry cardiac, inpatient  Chronic anticoagulation Home Eliquis not resumed on admission due to surgical debridement in the a.m. AM team to resume when the benefits outweigh the risk  Amputation stump infection [facility] Vascular service has been  consulted, plan for or debridement in the a.m.  COPD (chronic obstructive pulmonary disease) (HCC) Does not appear to  be in acute exacerbation at this time Ipratropium-albuterol 1 puff inhalation every 6 hours as needed for wheezing and shortness of breath resumed  Left thigh pain Complicated by chronic pain syndrome Morphine 4 g IV every 4 hours as needed for moderate pain, 15 hours of coverage ordered; Dilaudid 0.5 mg IV every 4 hours as needed for severe pain, 15 hours of coverage ordered  Essential hypertension Home Imdur 120 mg daily resumed, metoprolol tartrate 50 mg p.o. twice daily  Chart reviewed.   Complete echo on 02/16/2023: Estimated ejection fraction of 55 to 60%.  Grade 1 diastolic dysfunction.  DVT prophylaxis: Heparin 5000 units subcutaneous one-time dose ordered on day of admission; AM team to re-initiate pharmacologic DVT prophylaxis when the benefits outweigh the risk. Code Status: Full code Diet: Heart healthy Family Communication: No, patient states that her daughter already knows she is in the hospital Disposition Plan: Pending vascular evaluation Consults called: Vascular via timed staff message and Epic order Admission status: Telemetry cardiac, inpatient  Past Medical History:  Diagnosis Date   Abdominal aortic atherosclerosis (HCC)    a. 05/2017 CTA abd/pelvis: significant atherosclerotic dzs of the infrarenal abd Ao w/ some mural thrombus. No aneurysm or dissection.   Acute focal ischemia of small intestine (HCC)    Acute right-sided low back pain with right-sided sciatica 06/13/2017   AKI (acute kidney injury) (HCC) 07/29/2017   Anemia    Anginal pain (HCC)    a. 08/2012 Lexiscan MV: EF 54%, non ischemia/infarct.   Anxiety and depression    Baker's cyst of knee, right    a. 07/2016 U/S: 4.1 x 1.4 x 2.9 cystic structure in R poplitetal fossa.   Bell's palsy    CAD (coronary artery disease)    CHF (congestive heart failure) (HCC)    a.) TTE 06/20/2017: EF 60-65%, no rwma, G1DD, no source of cardiac emboli.   Chronic anticoagulation    a.) apixaban   Chronic idiopathic  constipation    Chronic mesenteric ischemia (HCC)    Chronic pain    COPD (chronic obstructive pulmonary disease) (HCC)    Dyspnea    Embolus of superior mesenteric artery (HCC)    a. 05/2017 CTA Abd/pelvis: apparent thrombus or embolus in prox SMA (70-90%); b. 05/2017 catheter directed tPA, mechanical thrombectomy, and stenting of the SMA.   Essential hypertension with goal blood pressure less than 140/90 01/19/2016   Gastroesophageal reflux disease 01/19/2016   GERD (gastroesophageal reflux disease)    H/O colectomy    History of 2019 novel coronavirus disease (COVID-19) 11/14/2019   History of kidney stones    Hyperlipidemia    Hypertension    Morbid obesity with BMI of 40.0-44.9, adult (HCC)    NSTEMI (non-ST elevated myocardial infarction) (HCC) 06/27/2021   a.) high sensitivity troponins trended: 893 --> 1587 --> 1748 --> 868 --> 735 ng/L   Occlusive mesenteric ischemia (HCC) 06/19/2017   Osteoarthritis    PAD (peripheral artery disease) (HCC)    PAF (paroxysmal atrial fibrillation) (HCC)    a.) CHA2DS2-VASc = 6 (sex, CHF, HTN, DVT x2, aortic plaque). b.) rate/rhythm maintained on oral metoprolol tartrate; chronically anticoagulated with standard dose apixaban.   Palpitations    Peripheral edema    Personal history of other malignant neoplasm of skin 06/01/2016   Pneumonia    Pre-diabetes    Primary osteoarthritis of both knees 06/13/2017  SBO (small bowel obstruction) (HCC) 08/06/2017   Skin cancer of nose    Superior mesenteric artery thrombosis (HCC)    Vitamin D deficiency    Past Surgical History:  Procedure Laterality Date   AMPUTATION Right 04/06/2022   Procedure: AMPUTATION ABOVE KNEE;  Surgeon: Annice Needy, MD;  Location: [facility];  Service: Vascular;  Laterality: Right;   AMPUTATION  12/07/2022   Procedure: AMPUTATION ABOVE KNEE REVISION WITH RESECTION OF FEMUR;  Surgeon: Annice Needy, MD;  Location: [facility];  Service: Vascular;;   AMPUTATION Left 02/21/2023    Procedure: AMPUTATION ABOVE KNEE;  Surgeon: Annice Needy, MD;  Location: [facility];  Service: General;  Laterality: Left;   AMPUTATION TOE Left 12/09/2019   Procedure: AMPUTATION TOE MPJ left;  Surgeon: Rosetta Posner, DPM;  Location: [facility];  Service: Podiatry;  Laterality: Left;   APPENDECTOMY     APPLICATION OF WOUND VAC  12/01/2021   Procedure: APPLICATION OF WOUND VAC;  Surgeon: Annice Needy, MD;  Location: [facility];  Service: Vascular;;   APPLICATION OF WOUND VAC Right 05/03/2022   Procedure: APPLICATION OF WOUND VAC;  Surgeon: Annice Needy, MD;  Location: [facility];  Service: Vascular;  Laterality: Right;   APPLICATION OF WOUND VAC Left 07/23/2022   Procedure: APPLICATION OF WOUND VAC;  Surgeon: Nada Libman, MD;  Location: [facility];  Service: Vascular;  Laterality: Left;  Prevena   APPLICATION OF WOUND VAC Right 08/28/2022   Procedure: APPLICATION OF WOUND VAC;  Surgeon: Annice Needy, MD;  Location: [facility];  Service: Vascular;  Laterality: Right;   APPLICATION OF WOUND VAC  12/07/2022   Procedure: APPLICATION OF WOUND VAC;  Surgeon: Annice Needy, MD;  Location: [facility];  Service: Vascular;;   AXILLARY-FEMORAL BYPASS GRAFT Left 07/23/2022   Procedure: BYPASS GRAFT AXILLA-BIFEMORAL;  Surgeon: Nada Libman, MD;  Location: [facility];  Service: Vascular;  Laterality: Left;   CHOLECYSTECTOMY     COLON SURGERY     COLONOSCOPY WITH PROPOFOL N/A 03/23/2022   Procedure: COLONOSCOPY WITH PROPOFOL;  Surgeon: Wyline Mood, MD;  Location: [facility];  Service: Gastroenterology;  Laterality: N/A;  rectal bleed   CORONARY/GRAFT ACUTE MI REVASCULARIZATION N/A 02/15/2023   Procedure: Coronary/Graft Acute MI Revascularization;  Surgeon: Marykay Lex, MD;  Location: [facility];  Service: Cardiovascular;  Laterality: N/A;   EMBOLECTOMY OR THROMBECTOMY, WITH OR WITHOUT CATHETER; FEMOROPOPLITEAL, AORTOILIAC ARTERY, BY LEG INCISION N/A 06/21/2020   Left - Decomp Fasciotomy Leg; Ant &/Or Lat  Compart Only; Midline - Angiography, Visceral, Selective Or Supraselective (With Or Without Flush Aortogram); Left - Revascularize, Endovasc, Open/Percut, Iliac Artery, Unilat, Initial Vessel; W/Translum Stent, W/Angioplasty; Right - Revascularize, Endovasc, Open/Percut, Iliac Artery, Ea Add`L Ipsilateral; W/Translumin Stent, W/Angioplasty; Location [facility];   ENDARTERECTOMY FEMORAL Left 12/01/2021   Procedure: ENDARTERECTOMY FEMORAL;  Surgeon: Annice Needy, MD;  Location: [facility];  Service: Vascular;  Laterality: Left;   ENDOVASCULAR REPAIR/STENT GRAFT Left 12/01/2021   Procedure: ENDOVASCULAR REPAIR/STENT GRAFT;  Surgeon: Annice Needy, MD;  Location: [facility];  Service: Cardiovascular;  Laterality: Left;   INCISION AND DRAINAGE OF WOUND Right 05/03/2022   Procedure: IRRIGATION AND DEBRIDEMENT RIGHT STUMP;  Surgeon: Annice Needy, MD;  Location: [facility];  Service: Vascular;  Laterality: Right;   IRRIGATION AND DEBRIDEMENT ABSCESS Right 08/28/2022   Procedure: AKA IRRIGATION AND DEBRIDEMENT ABSCESS;  Surgeon: Annice Needy, MD;  Location: [facility];  Service: Vascular;  Laterality:  Right;   LAPAROTOMY N/A 08/08/2017   Procedure: EXPLORATORY LAPAROTOMY POSSIBLE BOWEL RESECTION;  Surgeon: Leafy Ro, MD;  Location: [facility];  Service: General;  Laterality: N/A;   LEFT HEART CATH AND CORONARY ANGIOGRAPHY N/A 05/17/2021   Procedure: LEFT HEART CATH AND CORONARY ANGIOGRAPHY;  Surgeon: Alwyn Pea, MD;  Location: [facility];  Service: Cardiovascular;  Laterality: N/A;   LEFT HEART CATH AND CORONARY ANGIOGRAPHY N/A 02/15/2023   Procedure: LEFT HEART CATH AND CORONARY ANGIOGRAPHY;  Surgeon: Marykay Lex, MD;  Location: [facility];  Service: Cardiovascular;  Laterality: N/A;   LOWER EXTREMITY ANGIOGRAPHY Left 11/17/2019   Procedure: LOWER EXTREMITY ANGIOGRAPHY;  Surgeon: Annice Needy, MD;  Location: [facility];  Service: Cardiovascular;  Laterality: Left;   LOWER  EXTREMITY ANGIOGRAPHY Right 12/13/2020   Procedure: LOWER EXTREMITY ANGIOGRAPHY;  Surgeon: Annice Needy, MD;  Location: [facility];  Service: Cardiovascular;  Laterality: Right;   LOWER EXTREMITY ANGIOGRAPHY Right 01/10/2021   Procedure: LOWER EXTREMITY ANGIOGRAPHY;  Surgeon: Annice Needy, MD;  Location: [facility];  Service: Cardiovascular;  Laterality: Right;   LOWER EXTREMITY ANGIOGRAPHY Right 01/24/2021   Procedure: LOWER EXTREMITY ANGIOGRAPHY;  Surgeon: Annice Needy, MD;  Location: [facility];  Service: Cardiovascular;  Laterality: Right;   LOWER EXTREMITY ANGIOGRAPHY Bilateral 06/28/2021   Procedure: Lower Extremity Angiography;  Surgeon: Annice Needy, MD;  Location: [facility];  Service: Cardiovascular;  Laterality: Bilateral;   LOWER EXTREMITY ANGIOGRAPHY Right 11/28/2021   Procedure: LOWER EXTREMITY ANGIOGRAPHY;  Surgeon: Annice Needy, MD;  Location: [facility];  Service: Cardiovascular;  Laterality: Right;   LOWER EXTREMITY ANGIOGRAPHY Left 11/30/2021   Procedure: Lower Extremity Angiography;  Surgeon: Annice Needy, MD;  Location: [facility];  Service: Cardiovascular;  Laterality: Left;   LOWER EXTREMITY ANGIOGRAPHY Right 02/22/2022   Procedure: Lower Extremity Angiography;  Surgeon: Annice Needy, MD;  Location: [facility];  Service: Cardiovascular;  Laterality: Right;   LOWER EXTREMITY ANGIOGRAPHY Right 03/13/2022   Procedure: Lower Extremity Angiography;  Surgeon: Annice Needy, MD;  Location: [facility];  Service: Cardiovascular;  Laterality: Right;   LOWER EXTREMITY ANGIOGRAPHY Right 03/24/2022   Procedure: Lower Extremity Angiography;  Surgeon: Annice Needy, MD;  Location: [facility];  Service: Cardiovascular;  Laterality: Right;   LOWER EXTREMITY ANGIOGRAPHY Left 01/17/2023   Procedure: Lower Extremity Angiography;  Surgeon: Annice Needy, MD;  Location: [facility];  Service: Cardiovascular;   Laterality: Left;   LOWER EXTREMITY INTERVENTION N/A 11/19/2019   Procedure: LOWER EXTREMITY INTERVENTION;  Surgeon: Annice Needy, MD;  Location: [facility];  Service: Cardiovascular;  Laterality: N/A;   LOWER EXTREMITY INTERVENTION Bilateral 06/29/2021   Procedure: LOWER EXTREMITY INTERVENTION;  Surgeon: Annice Needy, MD;  Location: [facility];  Service: Cardiovascular;  Laterality: Bilateral;   LOWER EXTREMITY INTERVENTION Left 01/18/2023   Procedure: LOWER EXTREMITY INTERVENTION;  Surgeon: Annice Needy, MD;  Location: [facility];  Service: Cardiovascular;  Laterality: Left;   TEE WITHOUT CARDIOVERSION N/A 06/22/2017   Procedure: TRANSESOPHAGEAL ECHOCARDIOGRAM (TEE);  Surgeon: Iran Ouch, MD;  Location: [facility];  Service: Cardiovascular;  Laterality: N/A;   TOTAL KNEE ARTHROPLASTY Right 07/11/2018   Procedure: TOTAL KNEE ARTHROPLASTY;  Surgeon: Christena Flake, MD;  Location: [facility];  Service: Orthopedics;  Laterality: Right;  VAGINAL HYSTERECTOMY     VISCERAL ARTERY INTERVENTION N/A 06/20/2017   Procedure: Visceral Artery Intervention, possible aortic thrombectomy;  Surgeon: Annice Needy, MD;  Location: [facility];  Service: Cardiovascular;  Laterality: N/A;   VISCERAL ARTERY INTERVENTION N/A 01/28/2018   Procedure: VISCERAL ARTERY INTERVENTION;  Surgeon: Annice Needy, MD;  Location: [facility];  Service: Cardiovascular;  Laterality: N/A;   Social History:  reports that she quit smoking about 16 months ago. Her smoking use included cigarettes. She smoked an average of 1 pack per day. She has never used smokeless tobacco. She reports that she does not drink alcohol and does not use drugs.  Allergies  Allergen Reactions   Gabapentin     Tremors    Bactrim [Sulfamethoxazole-Trimethoprim] Itching   Family History  Problem Relation Age of Onset   Hypertension Mother    Heart disease Mother    Heart attack Mother    Breast cancer  Mother    Heart disease Father    Hypertension Father    Breast cancer Paternal Aunt    Family history: Family history reviewed and not pertinent  Prior to Admission medications   Medication Sig Start Date End Date Taking? Authorizing Provider  apixaban (ELIQUIS) 5 MG TABS tablet Take 1 tablet (5 mg total) by mouth 2 (two) times daily. 01/25/21   Stegmayer, Ranae Plumber, PA-C  ascorbic acid (VITAMIN C) 500 MG tablet Take 1 tablet (500 mg total) by mouth 2 (two) times daily. 02/24/23   [name], MD  aspirin EC 81 MG tablet Take 81 mg by mouth daily. Swallow whole.    [provider]  atorvastatin (LIPITOR) 80 MG tablet Take 80 mg by mouth at bedtime. Patient not taking: Reported on 02/15/2023    [provider]  citalopram (CELEXA) 40 MG tablet Take 1 tablet (40 mg total) by mouth daily. 02/04/22 09/07/22  Darlin Priestly, MD  COMBIVENT RESPIMAT 20-100 MCG/ACT AERS respimat Inhale 1 puff into the lungs every 6 (six) hours as needed. 03/27/22   [provider]  cyanocobalamin 1000 MCG tablet Take 1 tablet (1,000 mcg total) by mouth daily. 02/25/23   [name], MD  diphenoxylate-atropine (LOMOTIL) 2.5-0.025 MG tablet Take 1 tablet by mouth 4 (four) times daily. 02/11/23   [provider]  esomeprazole (NEXIUM) 20 MG capsule Take 20 mg by mouth daily. 07/06/22   [provider]  feeding supplement (ENSURE ENLIVE / ENSURE PLUS) LIQD Take 237 mLs by mouth 2 (two) times daily between meals. 05/06/22   [name], MD  ferrous sulfate 325 (65 FE) MG tablet Take 325 mg by mouth daily.    [provider]  HYDROcodone-acetaminophen (NORCO) 10-325 MG tablet Take 1 tablet by mouth every 6 (six) hours as needed for up to 14 days. Patient not taking: Reported on 03/21/2023 03/08/23 03/22/23  Georgiana Spinner, NP  isosorbide mononitrate (IMDUR) 120 MG 24 hr tablet Take 1 tablet (120 mg total) by mouth daily. 02/25/23   [name], MD  lidocaine (LIDODERM) 5 % Place 1 patch  onto the skin daily. Remove & Discard patch within 12 hours or as directed by MD 08/11/22   Arnetha Courser, MD  metoprolol tartrate (LOPRESSOR) 50 MG tablet Take 50 mg by mouth 2 (two) times daily. 02/11/23   [provider]  nicotine (NICODERM CQ - DOSED IN MG/24 HOURS) 21 mg/24hr patch Place 1 patch (21 mg total) onto the skin daily. 03/08/23   Sheppard Plumber  E, NP  oxyCODONE-acetaminophen (PERCOCET) 10-325 MG tablet Take 1 tablet by mouth every 6 (six) hours as needed for pain. 03/16/23   [provider]  pregabalin (LYRICA) 200 MG capsule Take 1 capsule (200 mg total) by mouth in the morning, at noon, and at bedtime. 01/31/23 05/31/23  Georgiana Spinner, NP  zinc sulfate 220 (50 Zn) MG capsule Take 1 capsule (220 mg total) by mouth daily. 02/24/23   [name], MD   Physical Exam: Vitals:   03/21/23 1625 03/21/23 1715 03/21/23 1745 03/21/23 1841  BP:  122/65 104/68 (!) 156/72  Pulse: 68 (!) 55 62 75  Resp: 14   14  Temp:      SpO2: 99% 99% 98% 97%  Weight:       Constitutional: appears older than chronological age, frail, chronically ill, NAD, calm Eyes: PERRL, lids and conjunctivae normal ENMT: Mucous membranes are moist. Posterior pharynx clear of any exudate or lesions. Age-appropriate dentition. Hearing appropriate Neck: normal, supple, no masses, no thyromegaly Respiratory: clear to auscultation bilaterally, no wheezing, no crackles. Normal respiratory effort. No accessory muscle use.  Cardiovascular: Regular rate and rhythm, no murmurs / rubs / gallops. No extremity edema. 2+ pedal pulses. No carotid bruits.  Abdomen: Morbidly obese abdomen, no tenderness, no masses palpated, no hepatosplenomegaly. Bowel sounds positive.  Musculoskeletal: no clubbing / cyanosis.  Bilateral AKA. Skin:  Neurologic: Sensation intact. Strength 5/5 in all 4.  Psychiatric: Normal judgment and insight. Alert and oriented x 3. Normal mood.   EKG: Not indicated at this time  Chest x-ray on  Admission: I personally reviewed and I agree with radiologist reading as below.  DG Femur Min 2 Views Left  Result Date: 03/21/2023 CLINICAL DATA:  Left thigh infection. Status post left above knee amputation. EXAM: LEFT FEMUR 2 VIEWS COMPARISON:  None Available. FINDINGS: Status post left above knee amputation. No acute fracture or dislocation is noted. No lytic destruction is seen to suggest osteomyelitis. Surgical staples are seen in the soft tissues of the stump. IMPRESSION: Status post left above knee amputation. No fracture or evidence of osteomyelitis is noted. Electronically Signed   By: Lupita Raider M.D.   On: 03/21/2023 17:41    Labs on Admission: I have personally reviewed following labs  CBC: Recent Labs  Lab 03/21/23 1412  WBC 7.5  HGB 12.8  HCT 40.9  MCV 85.7  PLT 421*   Basic Metabolic Panel: Recent Labs  Lab 03/21/23 1412  NA 137  K 3.6  CL 104  CO2 19*  GLUCOSE 121*  BUN 16  CREATININE 0.64  CALCIUM 9.6   GFR: Estimated Creatinine Clearance: 71.2 mL/min (by C-G formula based on SCr of 0.64 mg/dL).  Coagulation Profile: Recent Labs  Lab 03/21/23 1627  INR 1.6*   Urine analysis:    Component Value Date/Time   COLORURINE AMBER (A) 12/03/2022 0358   APPEARANCEUR HAZY (A) 12/03/2022 0358   APPEARANCEUR Clear 01/19/2015 1112   LABSPEC 1.039 (H) 12/03/2022 0358   LABSPEC 1.019 01/19/2015 1112   PHURINE 5.0 12/03/2022 0358   GLUCOSEU NEGATIVE 12/03/2022 0358   GLUCOSEU Negative 01/19/2015 1112   HGBUR NEGATIVE 12/03/2022 0358   BILIRUBINUR NEGATIVE 12/03/2022 0358   BILIRUBINUR Negative 01/19/2015 1112   KETONESUR 5 (A) 12/03/2022 0358   PROTEINUR 100 (A) 12/03/2022 0358   NITRITE NEGATIVE 12/03/2022 0358   LEUKOCYTESUR NEGATIVE 12/03/2022 0358   LEUKOCYTESUR Negative 01/19/2015 1112   This document was prepared using Dragon Voice Recognition software  and may include unintentional dictation errors.  Dr. Sedalia Muta [facility]  If 7PM-7AM,  please contact overnight-coverage provider If 7AM-7PM, please contact day attending provider [email or web address]  03/21/2023, 7:19 PM

## 2023-03-21 NOTE — Assessment & Plan Note (Addendum)
Respiratory status stable. 

## 2023-03-21 NOTE — Progress Notes (Signed)
Subjective:    Patient ID: Carla [name], female    DOB: 05/20/1961, 62 y.o.   MRN: [id number] Chief Complaint  Patient presents with   Follow-up    [facility] 3 week follow up    Carla [name] is a 62 year old female who presents today for follow-up evaluation of her left above-knee amputation as well as right above-knee amputation wound.  She underwent left AKA on 02/21/2023 after multiple revascularization and attempts at limb salvage were unsuccessful.  She also had a wound following debridement of her right AKA several months ago.  Today the right AKA wound is much improved and much shallower.  We have stopped use of the wound VAC and transition Aquacel.  There is still a small portion that remains open.  The left above-knee amputation wound has areas of necrotic tissue and very purulent foul-smelling drainage.  She notes that the drainage area began being foul-smelling yesterday but the wound has not look good for several days.  She has a home health nurse and she said that the home health nurse told her it was just some dead skin.  She notes that the stump is also very very tender to slight touch.    Review of Systems  Skin:  Positive for wound.  All other systems reviewed and are negative.      Objective:   Physical Exam Vitals reviewed.  HENT:     Head: Normocephalic.  Cardiovascular:     Rate and Rhythm: Normal rate.  Pulmonary:     Effort: Pulmonary effort is normal.  Musculoskeletal:     Right Lower Extremity: Right leg is amputated above knee.     Left Lower Extremity: Left leg is amputated above knee.  Feet:     Left foot:     Skin integrity: Skin breakdown, erythema and warmth present.     Comments: See picture of L AKA Skin:    General: Skin is warm.  Neurological:     Mental Status: She is alert and oriented to person, place, and time.  Psychiatric:        Mood and Affect: Mood normal.        Behavior: Behavior normal.        Thought Content: Thought content normal.         Judgment: Judgment normal.       BP (!) 155/88 (BP Location: Left Arm)   Pulse 67   Resp 16   Past Medical History:  Diagnosis Date   Abdominal aortic atherosclerosis (HCC)    a. 05/2017 CTA abd/pelvis: significant atherosclerotic dzs of the infrarenal abd Ao w/ some mural thrombus. No aneurysm or dissection.   Acute focal ischemia of small intestine (HCC)    Acute right-sided low back pain with right-sided sciatica 06/13/2017   AKI (acute kidney injury) (HCC) 07/29/2017   Anemia    Anginal pain (HCC)    a. 08/2012 Lexiscan MV: EF 54%, non ischemia/infarct.   Anxiety and depression    Baker's cyst of knee, right    a. 07/2016 U/S: 4.1 x 1.4 x 2.9 cystic structure in R poplitetal fossa.   Bell's palsy    CAD (coronary artery disease)    CHF (congestive heart failure) (HCC)    a.) TTE 06/20/2017: EF 60-65%, no rwma, G1DD, no source of cardiac emboli.   Chronic anticoagulation    a.) apixaban   Chronic idiopathic constipation    Chronic mesenteric ischemia (HCC)    Chronic pain  COPD (chronic obstructive pulmonary disease) (HCC)    Dyspnea    Embolus of superior mesenteric artery (HCC)    a. 05/2017 CTA Abd/pelvis: apparent thrombus or embolus in prox SMA (70-90%); b. 05/2017 catheter directed tPA, mechanical thrombectomy, and stenting of the SMA.   Essential hypertension with goal blood pressure less than 140/90 01/19/2016   Gastroesophageal reflux disease 01/19/2016   GERD (gastroesophageal reflux disease)    H/O colectomy    History of 2019 novel coronavirus disease (COVID-19) 11/14/2019   History of kidney stones    Hyperlipidemia    Hypertension    Morbid obesity with BMI of 40.0-44.9, adult (HCC)    NSTEMI (non-ST elevated myocardial infarction) (HCC) 06/27/2021   a.) high sensitivity troponins trended: 893 --> 1587 --> 1748 --> 868 --> 735 ng/L   Occlusive mesenteric ischemia (HCC) 06/19/2017   Osteoarthritis    PAD (peripheral artery disease) (HCC)    PAF  (paroxysmal atrial fibrillation) (HCC)    a.) CHA2DS2-VASc = 6 (sex, CHF, HTN, DVT x2, aortic plaque). b.) rate/rhythm maintained on oral metoprolol tartrate; chronically anticoagulated with standard dose apixaban.   Palpitations    Peripheral edema    Personal history of other malignant neoplasm of skin 06/01/2016   Pneumonia    Pre-diabetes    Primary osteoarthritis of both knees 06/13/2017   SBO (small bowel obstruction) (HCC) 08/06/2017   Skin cancer of nose    Superior mesenteric artery thrombosis (HCC)    Vitamin D deficiency     Social History   Socioeconomic History   Marital status: Single    Spouse name: Not on file   Number of children: Not on file   Years of education: Not on file   Highest education level: Not on file  Occupational History   Occupation: Unemployed  Tobacco Use   Smoking status: Former    Packs/day: 1    Types: Cigarettes    Quit date: 2023    Years since quitting: 1.4   Smokeless tobacco: Never  Vaping Use   Vaping Use: Never used  Substance and Sexual Activity   Alcohol use: No   Drug use: No   Sexual activity: Not on file  Other Topics Concern   Not on file  Social History Narrative   Lives in [place] with daughter   Social Determinants of Health   Financial Resource Strain: Not on file  Food Insecurity: No Food Insecurity (01/17/2023)   Hunger Vital Sign    Worried About Running Out of Food in the Last Year: Never true    Ran Out of Food in the Last Year: Never true  Transportation Needs: No Transportation Needs (01/17/2023)   PRAPARE - Administrator, Civil Service (Medical): No    Lack of Transportation (Non-Medical): No  Physical Activity: Not on file  Stress: Not on file  Social Connections: Not on file  Intimate Partner Violence: Not At Risk (01/17/2023)   Humiliation, Afraid, Rape, and Kick questionnaire    Fear of Current or Ex-Partner: No    Emotionally Abused: No    Physically Abused: No    Sexually  Abused: No    Past Surgical History:  Procedure Laterality Date   AMPUTATION Right 04/06/2022   Procedure: AMPUTATION ABOVE KNEE;  Surgeon: Annice Needy, MD;  Location: [facility];  Service: Vascular;  Laterality: Right;   AMPUTATION  12/07/2022   Procedure: AMPUTATION ABOVE KNEE REVISION WITH RESECTION OF FEMUR;  Surgeon: Annice Needy, MD;  Location: [facility];  Service: Vascular;;   AMPUTATION Left 02/21/2023   Procedure: AMPUTATION ABOVE KNEE;  Surgeon: Annice Needy, MD;  Location: [facility];  Service: General;  Laterality: Left;   AMPUTATION TOE Left 12/09/2019   Procedure: AMPUTATION TOE MPJ left;  Surgeon: Rosetta Posner, DPM;  Location: [facility];  Service: Podiatry;  Laterality: Left;   APPENDECTOMY     APPLICATION OF WOUND VAC  12/01/2021   Procedure: APPLICATION OF WOUND VAC;  Surgeon: Annice Needy, MD;  Location: [facility];  Service: Vascular;;   APPLICATION OF WOUND VAC Right 05/03/2022   Procedure: APPLICATION OF WOUND VAC;  Surgeon: Annice Needy, MD;  Location: [facility];  Service: Vascular;  Laterality: Right;   APPLICATION OF WOUND VAC Left 07/23/2022   Procedure: APPLICATION OF WOUND VAC;  Surgeon: Nada Libman, MD;  Location: [facility];  Service: Vascular;  Laterality: Left;  Prevena   APPLICATION OF WOUND VAC Right 08/28/2022   Procedure: APPLICATION OF WOUND VAC;  Surgeon: Annice Needy, MD;  Location: [facility];  Service: Vascular;  Laterality: Right;   APPLICATION OF WOUND VAC  12/07/2022   Procedure: APPLICATION OF WOUND VAC;  Surgeon: Annice Needy, MD;  Location: [facility];  Service: Vascular;;   AXILLARY-FEMORAL BYPASS GRAFT Left 07/23/2022   Procedure: BYPASS GRAFT AXILLA-BIFEMORAL;  Surgeon: Nada Libman, MD;  Location: [facility];  Service: Vascular;  Laterality: Left;   CHOLECYSTECTOMY     COLON SURGERY     COLONOSCOPY WITH PROPOFOL N/A 03/23/2022   Procedure: COLONOSCOPY WITH PROPOFOL;  Surgeon: Wyline Mood, MD;  Location[facility];  Service: Gastroenterology;   Laterality: N/A;  rectal bleed   CORONARY/GRAFT ACUTE MI REVASCULARIZATION N/A 02/15/2023   Procedure: Coronary/Graft Acute MI Revascularization;  Surgeon: Marykay Lex, MD;  Location: [facility] INVASIVE [facility];  Service: Cardiovascular;  Laterality: N/A;   EMBOLECTOMY OR THROMBECTOMY, WITH OR WITHOUT CATHETER; FEMOROPOPLITEAL, AORTOILIAC ARTERY, BY LEG INCISION N/A 06/21/2020   Left - Decomp Fasciotomy Leg; Ant &/Or Lat Compart Only; Midline - Angiography, Visceral, Selective Or Supraselective (With Or Without Flush Aortogram); Left - Revascularize, Endovasc, Open/Percut, Iliac Artery, Unilat, Initial Vessel; W/Translum Stent, W/Angioplasty; Right - Revascularize, Endovasc, Open/Percut, Iliac Artery, Ea Add`L Ipsilateral; W/Translumin Stent, W/Angioplasty; Location [facility];   ENDARTERECTOMY FEMORAL Left 12/01/2021   Procedure: ENDARTERECTOMY FEMORAL;  Surgeon: Annice Needy, MD;  Location: [facility];  Service: Vascular;  Laterality: Left;   ENDOVASCULAR REPAIR/STENT GRAFT Left 12/01/2021   Procedure: ENDOVASCULAR REPAIR/STENT GRAFT;  Surgeon: Annice Needy, MD;  Location: [facility];  Service: Cardiovascular;  Laterality: Left;   INCISION AND DRAINAGE OF WOUND Right 05/03/2022   Procedure: IRRIGATION AND DEBRIDEMENT RIGHT STUMP;  Surgeon: Annice Needy, MD;  Location: [facility];  Service: Vascular;  Laterality: Right;   IRRIGATION AND DEBRIDEMENT ABSCESS Right 08/28/2022   Procedure: AKA IRRIGATION AND DEBRIDEMENT ABSCESS;  Surgeon: Annice Needy, MD;  Location: [facility];  Service: Vascular;  Laterality: Right;   LAPAROTOMY N/A 08/08/2017   Procedure: EXPLORATORY LAPAROTOMY POSSIBLE BOWEL RESECTION;  Surgeon: Leafy Ro, MD;  Location: [facility];  Service: General;  Laterality: N/A;   LEFT HEART CATH AND CORONARY ANGIOGRAPHY N/A 05/17/2021   Procedure: LEFT HEART CATH AND CORONARY ANGIOGRAPHY;  Surgeon: Alwyn Pea, MD;  Location: [facility];  Service: Cardiovascular;  Laterality: N/A;    LEFT HEART CATH AND CORONARY ANGIOGRAPHY N/A 02/15/2023   Procedure: LEFT HEART CATH AND CORONARY ANGIOGRAPHY;  Surgeon: Marykay Lex, MD;  Location: [facility] INVASIVE CV LAB;  Service: Cardiovascular;  Laterality: N/A;   LOWER EXTREMITY ANGIOGRAPHY Left 11/17/2019   Procedure: LOWER EXTREMITY ANGIOGRAPHY;  Surgeon: Annice Needy, MD;  Location: [facility];  Service: Cardiovascular;  Laterality: Left;   LOWER EXTREMITY ANGIOGRAPHY Right 12/13/2020   Procedure: LOWER EXTREMITY ANGIOGRAPHY;  Surgeon: Annice Needy, MD;  Location: [facility];  Service: Cardiovascular;  Laterality: Right;   LOWER EXTREMITY ANGIOGRAPHY Right 01/10/2021   Procedure: LOWER EXTREMITY ANGIOGRAPHY;  Surgeon: Annice Needy, MD;  Location: [facility];  Service: Cardiovascular;  Laterality: Right;   LOWER EXTREMITY ANGIOGRAPHY Right 01/24/2021   Procedure: LOWER EXTREMITY ANGIOGRAPHY;  Surgeon: Annice Needy, MD;  Location: [facility];  Service: Cardiovascular;  Laterality: Right;   LOWER EXTREMITY ANGIOGRAPHY Bilateral 06/28/2021   Procedure: Lower Extremity Angiography;  Surgeon: Annice Needy, MD;  Location: [facility];  Service: Cardiovascular;  Laterality: Bilateral;   LOWER EXTREMITY ANGIOGRAPHY Right 11/28/2021   Procedure: LOWER EXTREMITY ANGIOGRAPHY;  Surgeon: Annice Needy, MD;  Location: [facility];  Service: Cardiovascular;  Laterality: Right;   LOWER EXTREMITY ANGIOGRAPHY Left 11/30/2021   Procedure: Lower Extremity Angiography;  Surgeon: Annice Needy, MD;  Location: [facility];  Service: Cardiovascular;  Laterality: Left;   LOWER EXTREMITY ANGIOGRAPHY Right 02/22/2022   Procedure: Lower Extremity Angiography;  Surgeon: Annice Needy, MD;  Location: [facility];  Service: Cardiovascular;  Laterality: Right;   LOWER EXTREMITY ANGIOGRAPHY Right 03/13/2022   Procedure: Lower Extremity Angiography;  Surgeon: Annice Needy, MD;  Location: [facility]  [facility];  Service: Cardiovascular;  Laterality: Right;   LOWER EXTREMITY ANGIOGRAPHY Right 03/24/2022   Procedure: Lower Extremity Angiography;  Surgeon: Annice Needy, MD;  Location: [facility];  Service: Cardiovascular;  Laterality: Right;   LOWER EXTREMITY ANGIOGRAPHY Left 01/17/2023   Procedure: Lower Extremity Angiography;  Surgeon: Annice Needy, MD;  Location: [facility];  Service: Cardiovascular;  Laterality: Left;   LOWER EXTREMITY INTERVENTION N/A 11/19/2019   Procedure: LOWER EXTREMITY INTERVENTION;  Surgeon: Annice Needy, MD;  Location: [facility];  Service: Cardiovascular;  Laterality: N/A;   LOWER EXTREMITY INTERVENTION Bilateral 06/29/2021   Procedure: LOWER EXTREMITY INTERVENTION;  Surgeon: Annice Needy, MD;  Location: [facility];  Service: Cardiovascular;  Laterality: Bilateral;   LOWER EXTREMITY INTERVENTION Left 01/18/2023   Procedure: LOWER EXTREMITY INTERVENTION;  Surgeon: Annice Needy, MD;  Location: [facility];  Service: Cardiovascular;  Laterality: Left;   TEE WITHOUT CARDIOVERSION N/A 06/22/2017   Procedure: TRANSESOPHAGEAL ECHOCARDIOGRAM (TEE);  Surgeon: Iran Ouch, MD;  Location: [facility];  Service: Cardiovascular;  Laterality: N/A;   TOTAL KNEE ARTHROPLASTY Right 07/11/2018   Procedure: TOTAL KNEE ARTHROPLASTY;  Surgeon: Christena Flake, MD;  Location: [facility];  Service: Orthopedics;  Laterality: Right;   VAGINAL HYSTERECTOMY     VISCERAL ARTERY INTERVENTION N/A 06/20/2017   Procedure: Visceral Artery Intervention, possible aortic thrombectomy;  Surgeon: Annice Needy, MD;  Location: [facility];  Service: Cardiovascular;  Laterality: N/A;   VISCERAL ARTERY INTERVENTION N/A 01/28/2018   Procedure: VISCERAL ARTERY INTERVENTION;  Surgeon: Annice Needy, MD;  Location: [facility];  Service: Cardiovascular;  Laterality: N/A;    Family History  Problem Relation Age of Onset   Hypertension Mother    Heart  disease Mother    Heart attack Mother    Breast cancer Mother    Heart disease Father    Hypertension Father    Breast cancer Paternal Aunt     Allergies  Allergen Reactions   Gabapentin     Tremors    Bactrim [Sulfamethoxazole-Trimethoprim] Itching       Latest Ref Rng & Units 02/22/2023    5:12 AM 02/20/2023    4:34 AM 02/19/2023    4:21 AM  CBC  WBC 4.0 - 10.5 K/uL 15.6  7.1  6.4   Hemoglobin 12.0 - 15.0 g/dL 16.1  09.6  04.5   Hematocrit 36.0 - 46.0 % 33.5  32.3  34.1   Platelets 150 - 400 K/uL 314  254  250       CMP     Component Value Date/Time   NA 139 02/19/2023 0421   NA 140 02/12/2015 1137   K 3.8 02/19/2023 0421   K 2.9 (L) 02/12/2015 1137   CL 101 02/19/2023 0421   CL 107 02/12/2015 1137   CO2 27 02/19/2023 0421   CO2 27 02/12/2015 1137   GLUCOSE 151 (H) 02/19/2023 0421   GLUCOSE 120 (H) 02/12/2015 1137   BUN 9 02/19/2023 0421   BUN 15 02/12/2015 1137   CREATININE 0.68 02/19/2023 0421   CREATININE 0.78 02/12/2015 1137   CALCIUM 9.2 02/19/2023 0421   CALCIUM 9.0 02/12/2015 1137   PROT 7.0 01/17/2023 1723   PROT 7.0 09/14/2020 1139   PROT 7.1 01/19/2015 1112   ALBUMIN 3.0 (L) 01/17/2023 1723   ALBUMIN 3.9 09/14/2020 1139   ALBUMIN 3.8 01/19/2015 1112   AST 12 (L) 01/17/2023 1723   AST 17 01/19/2015 1112   ALT 11 01/17/2023 1723   ALT 11 (L) 01/19/2015 1112   ALKPHOS 96 01/17/2023 1723   ALKPHOS 84 01/19/2015 1112   BILITOT 0.3 01/17/2023 1723   BILITOT <0.2 09/14/2020 1139   BILITOT 0.5 01/19/2015 1112   GFRNONAA >60 02/19/2023 0421   GFRNONAA >60 02/12/2015 1137   GFRAA >60 11/23/2019 1330   GFRAA >60 02/12/2015 1137     No results found.     Assessment & Plan:   1. S/P AKA (above knee amputation) unilateral, right (HCC) The wound on the right lower extremity is improving.  The wound VAC has been removed.  Will continue to use Aquacel at the wound.  2. Unilateral complete AKA, left (HCC) The patient's wound is very clearly  infected.  With movement the patient begins to have copious purulent drainage drainage, with a significant foul-smelling odor.  The incision itself clearly has necrotic areas.  Besides the evident infection, I do have some concern that the remaining bone may have some underlying osteomyelitis as the patient has significant tenderness and discomfort.  Due to the infection and the concern for possible osteomyelitis we will send the patient to the emergency room today for evaluation.  I suspect that the patient will need an incision and drainage of the cavity.   Current Outpatient Medications on File Prior to Visit  Medication Sig Dispense Refill   apixaban (ELIQUIS) 5 MG TABS tablet Take 1 tablet (5 mg total) by mouth 2 (two) times daily. 60 tablet 11   ascorbic acid (VITAMIN C) 500 MG tablet Take 1 tablet (500 mg total) by mouth 2 (two) times daily. 30 tablet 0   aspirin EC 81 MG tablet Take 81 mg by mouth daily. Swallow whole.     COMBIVENT RESPIMAT  20-100 MCG/ACT AERS respimat Inhale 1 puff into the lungs every 6 (six) hours as needed.     cyanocobalamin 1000 MCG tablet Take 1 tablet (1,000 mcg total) by mouth daily. 30 tablet 0   diphenoxylate-atropine (LOMOTIL) 2.5-0.025 MG tablet Take 1 tablet by mouth 4 (four) times daily.     esomeprazole (NEXIUM) 20 MG capsule Take 20 mg by mouth daily.     feeding supplement (ENSURE ENLIVE / ENSURE PLUS) LIQD Take 237 mLs by mouth 2 (two) times daily between meals. 237 mL 12   ferrous sulfate 325 (65 FE) MG tablet Take 325 mg by mouth daily.     isosorbide mononitrate (IMDUR) 120 MG 24 hr tablet Take 1 tablet (120 mg total) by mouth daily. 30 tablet 1   lidocaine (LIDODERM) 5 % Place 1 patch onto the skin daily. Remove & Discard patch within 12 hours or as directed by MD 30 patch 0   metoprolol tartrate (LOPRESSOR) 50 MG tablet Take 50 mg by mouth 2 (two) times daily.     nicotine (NICODERM CQ - DOSED IN MG/24 HOURS) 21 mg/24hr patch Place 1 patch (21 mg  total) onto the skin daily. 30 patch 1   oxyCODONE-acetaminophen (PERCOCET) 10-325 MG tablet Take 1 tablet by mouth every 6 (six) hours as needed for pain.     pregabalin (LYRICA) 200 MG capsule Take 1 capsule (200 mg total) by mouth in the morning, at noon, and at bedtime. 90 capsule 3   zinc sulfate 220 (50 Zn) MG capsule Take 1 capsule (220 mg total) by mouth daily. 30 capsule 0   atorvastatin (LIPITOR) 80 MG tablet Take 80 mg by mouth at bedtime. (Patient not taking: Reported on 02/15/2023)     citalopram (CELEXA) 40 MG tablet Take 1 tablet (40 mg total) by mouth daily. 30 tablet 2   HYDROcodone-acetaminophen (NORCO) 10-325 MG tablet Take 1 tablet by mouth every 6 (six) hours as needed for up to 14 days. (Patient not taking: Reported on 03/21/2023) 56 tablet 0   Current Facility-Administered Medications on File Prior to Visit  Medication Dose Route Frequency Provider Last Rate Last Admin   Vitamin D (Ergocalciferol) (DRISDOL) 1.25 MG (50000 UNIT) capsule 50,000 Units  50,000 Units Oral Once per day on Mon Wed Fri Pace, Brien R, NP        There are no Patient Instructions on file for this visit. No follow-ups on file.   Georgiana Spinner, NP

## 2023-03-21 NOTE — Assessment & Plan Note (Signed)
Due to AKA stump infection, with underlying chronic pain syndrome --Pain control per orders

## 2023-03-21 NOTE — ED Provider Notes (Signed)
[facility] Provider Note    Event Date/Time   First MD Initiated Contact with Patient 03/21/23 1600     (approximate)   History   Wound Infection   HPI  Carla [name] is a 62 y.o. female past medical history significant for PAD with recent left AKA on 02/21/2023 after multiple revascularization attempts and limb salvage procedures were unsuccessful with Dr. Wyn Quaker, who presents to the emergency department with concern for left lower leg infection.  Ongoing pain since surgery.  Patient has been followed by wound care.  Evaluated today by vascular surgery and told to come into the emergency department given concern for an infection of the wound.  Patient with severe pain to her leg.  Denies any fever but does endorse chills.     Physical Exam   Triage Vital Signs: ED Triage Vitals [03/21/23 1410]  Enc Vitals Group     BP (!) 109/90     Pulse Rate 66     Resp 18     Temp 97.9 F (36.6 C)     Temp src      SpO2 95 %     Weight 180 lb (81.6 kg)     Height      Head Circumference      Peak Flow      Pain Score 10     Pain Loc      Pain Edu?      Excl. in GC?     Most recent vital signs: Vitals:   03/21/23 1620 03/21/23 1625  BP:    Pulse: 76 68  Resp: 15 14  Temp:    SpO2: 100% 99%    Physical Exam Constitutional:      General: She is in acute distress.     Appearance: She is well-developed.  HENT:     Head: Atraumatic.  Eyes:     Conjunctiva/sclera: Conjunctivae normal.  Cardiovascular:     Rate and Rhythm: Regular rhythm.  Pulmonary:     Effort: No respiratory distress.  Abdominal:     General: There is no distension.  Musculoskeletal:     Cervical back: Normal range of motion.     Comments: Bilateral AKA's.  Left AKA with staples in place.  Ulcerative wound to the left lower leg with surrounding erythema warmth and induration.  Significant tenderness to palpation.  No crepitus.  Purulent drainage.  Skin:    General: Skin is warm.   Neurological:     Mental Status: She is alert. Mental status is at baseline.     IMPRESSION / MDM / ASSESSMENT AND PLAN / ED COURSE  I reviewed the triage vital signs and the nursing notes.  Differential diagnosis including osteomyelitis, abscess, complicated skin infection from postoperative wound from AKA  On chart review patient was evaluated by vascular surgery today and sent to the emergency department for concern for possible osteomyelitis for antibiotics and possible drainage of an abscess.  RADIOLOGY I independently reviewed imaging, my interpretation of imaging: X-ray of the left femur with no acute fracture or dislocation.  No obvious signs of osteomyelitis.  LABS (all labs ordered are listed, but only abnormal results are displayed) Labs interpreted as -    Labs Reviewed  CBC - Abnormal; Notable for the following components:      Result Value   RDW 15.7 (*)    Platelets 421 (*)    All other components within normal limits  BASIC METABOLIC PANEL -  Abnormal; Notable for the following components:   CO2 19 (*)    Glucose, Bld 121 (*)    All other components within normal limits  PROTIME-INR - Abnormal; Notable for the following components:   Prothrombin Time 19.6 (*)    INR 1.6 (*)    All other components within normal limits  SEDIMENTATION RATE - Abnormal; Notable for the following components:   Sed Rate 60 (*)    All other components within normal limits  CULTURE, BLOOD (ROUTINE X 2)  CULTURE, BLOOD (ROUTINE X 2)  LACTIC ACID, PLASMA  APTT  LACTIC ACID, PLASMA  C-REACTIVE PROTEIN     MDM  Elevated acute inflammatory markers.  Normal lactic acid.  No significant leukocytosis.  X-ray imaging without an obvious osteomyelitis.  Blood cultures obtained.  Started on broad-spectrum antibiotics to cover for MRSA and Pseudomonas.  Given IV vancomycin and Zosyn.  Given 1 L of LR.    Message vascular surgery and made aware that the patient was in the emergency  department.  Admitted to the hospitalist for concern for soft tissue infection and possible osteomyelitis.  Given multiple doses of IV pain medication.     PROCEDURES:  Critical Care performed: No  Procedures  Patient's presentation is most consistent with acute presentation with potential threat to life or bodily function.   MEDICATIONS ORDERED IN ED: Medications  lactated ringers infusion ( Intravenous New Bag/Given 03/21/23 1700)  vancomycin (VANCOREADY) IVPB 1750 mg/350 mL (has no administration in time range)  sodium chloride 0.9 % bolus 500 mL (0 mLs Intravenous Stopped 03/21/23 1700)  HYDROmorphone (DILAUDID) injection 0.5 mg (0.5 mg Intravenous Given 03/21/23 1638)  piperacillin-tazobactam (ZOSYN) IVPB 3.375 g (0 g Intravenous Stopped 03/21/23 1709)  HYDROmorphone (DILAUDID) injection 0.5 mg (0.5 mg Intravenous Given 03/21/23 1708)    FINAL CLINICAL IMPRESSION(S) / ED DIAGNOSES   Final diagnoses:  Wound infection  Cellulitis of left lower extremity     Rx / DC Orders   ED Discharge Orders     None        Note:  This document was prepared using Dragon voice recognition software and may include unintentional dictation errors.   [name], MD 03/21/23 1751

## 2023-03-21 NOTE — Assessment & Plan Note (Addendum)
-   Continue Eliquis 

## 2023-03-21 NOTE — Consult Note (Signed)
Pharmacy Antibiotic Note  Carla [name] is a 62 y.o. female admitted on 03/21/2023 with  wound infection . PMH significant for HTN, GERD, AF, COPD, PVD. Patient was sent to ED by vascular given concern for possible osteomyelitis. Patient has received multiple IV antibiotics this year including meropenem, daptomycin, and ertapenem in February and March. She has also received other oral agents including omadacycline and metronidazole this year. Historical wound cultures have grown resistant bacteria including MRSA and ESBL E. coli. Surgical pathology from 5/1 AKA was negative for OM. She has a documented intolerance to Bactrim (itching). Pharmacy has been consulted for vancomycin and cefepime dosing.  Plan: Day 1 of antibiotics Give vancomycin 1750 mg IV x1 followed by 1500 mg IV Q24H. Goal AUC 400-550. Expected AUC: 494.3 Expected Css min: 11.2 SCr used: 0.8 (actual 0.64)  Weight used: IBW, Vd used: 0.72 (BMI 32.8) Start cefepime 2 g IV Q8H Continue to monitor renal function and follow culture results  Weight: 81.6 kg (180 lb)  Temp (24hrs), Avg:97.9 F (36.6 C), Min:97.9 F (36.6 C), Max:97.9 F (36.6 C)  Recent Labs  Lab 03/21/23 1412 03/21/23 1627  WBC 7.5  --   CREATININE 0.64  --   LATICACIDVEN  --  1.2    Estimated Creatinine Clearance: 71.2 mL/min (by C-G formula based on SCr of 0.64 mg/dL).    Allergies  Allergen Reactions   Gabapentin     Tremors    Bactrim [Sulfamethoxazole-Trimethoprim] Itching    Antimicrobials this admission: 5/29 Zosyn x1  5/29 Vancomycin >>  5/29 Cefepime >>   Dose adjustments this admission: N/A  Microbiology results: 5/29 BCx: IP  Thank you for allowing pharmacy to be a part of this patient's care.  Celene Squibb, PharmD PGY1 Pharmacy Resident 03/21/2023 6:35 PM

## 2023-03-21 NOTE — Sepsis Progress Note (Signed)
Code Sepsis protocol being monitored by eLink. 

## 2023-03-21 NOTE — ED Triage Notes (Signed)
Pt to ED from vascular for infection to left thigh. Pt bilateral leg amputee. States has been having chills for the past few days.

## 2023-03-21 NOTE — ED Notes (Signed)
EDP at [facility]. Pt alert, tearful, interactive, c/o pain.

## 2023-03-22 ENCOUNTER — Inpatient Hospital Stay: Payer: 59 | Admitting: Registered Nurse

## 2023-03-22 ENCOUNTER — Other Ambulatory Visit: Payer: Self-pay

## 2023-03-22 ENCOUNTER — Encounter
Admission: EM | Disposition: A | Discharge status: Home / Self Care | Payer: Self-pay | Source: Home / Self Care | Attending: Internal Medicine

## 2023-03-22 ENCOUNTER — Encounter: Payer: Self-pay | Admitting: Vascular Surgery

## 2023-03-22 DIAGNOSIS — T8744 Infection of amputation stump, left lower extremity: Secondary | ICD-10-CM

## 2023-03-22 DIAGNOSIS — Z9889 Other specified postprocedural states: Secondary | ICD-10-CM

## 2023-03-22 DIAGNOSIS — T8740 Infection of amputation stump, unspecified extremity: Secondary | ICD-10-CM | POA: Diagnosis not present

## 2023-03-22 DIAGNOSIS — Z89612 Acquired absence of left leg above knee: Secondary | ICD-10-CM

## 2023-03-22 DIAGNOSIS — T8754 Necrosis of amputation stump, left lower extremity: Secondary | ICD-10-CM | POA: Diagnosis not present

## 2023-03-22 DIAGNOSIS — T8789 Other complications of amputation stump: Secondary | ICD-10-CM

## 2023-03-22 DIAGNOSIS — Z87891 Personal history of nicotine dependence: Secondary | ICD-10-CM

## 2023-03-22 HISTORY — PX: AMPUTATION: SHX166

## 2023-03-22 LAB — CBC
HCT: 33.8 % — ABNORMAL LOW (ref 36.0–46.0)
Hemoglobin: 10.4 g/dL — ABNORMAL LOW (ref 12.0–15.0)
MCH: 26.8 pg (ref 26.0–34.0)
MCHC: 30.8 g/dL (ref 30.0–36.0)
MCV: 87.1 fL (ref 80.0–100.0)
Platelets: 289 10*3/uL (ref 150–400)
RBC: 3.88 MIL/uL (ref 3.87–5.11)
RDW: 15.9 % — ABNORMAL HIGH (ref 11.5–15.5)
WBC: 6.7 10*3/uL (ref 4.0–10.5)
nRBC: 0 % (ref 0.0–0.2)

## 2023-03-22 LAB — BASIC METABOLIC PANEL
Anion gap: 9 (ref 5–15)
BUN: 13 mg/dL (ref 8–23)
CO2: 22 mmol/L (ref 22–32)
Calcium: 8.8 mg/dL — ABNORMAL LOW (ref 8.9–10.3)
Chloride: 109 mmol/L (ref 98–111)
Creatinine, Ser: 0.4 mg/dL — ABNORMAL LOW (ref 0.44–1.00)
GFR, Estimated: >60 mL/min (ref >60)
Glucose, Bld: 104 mg/dL — ABNORMAL HIGH (ref 70–99)
Potassium: 3.5 mmol/L (ref 3.5–5.1)
Sodium: 140 mmol/L (ref 135–145)

## 2023-03-22 LAB — CULTURE, BLOOD (ROUTINE X 2): Special Requests: ADEQUATE

## 2023-03-22 LAB — AEROBIC/ANAEROBIC CULTURE W GRAM STAIN (SURGICAL/DEEP WOUND)

## 2023-03-22 SURGERY — AMPUTATION, ABOVE KNEE
Anesthesia: General | Site: Leg Upper | Laterality: Left

## 2023-03-22 MED ORDER — OXYCODONE HCL 5 MG PO TABS
5.0000 mg | ORAL_TABLET | ORAL | Status: DC | PRN
  Administered 2023-03-22 – 2023-03-23 (×3): 10 mg via ORAL
  Administered 2023-03-23: 5 mg via ORAL
  Filled 2023-03-22 (×5): qty 2

## 2023-03-22 MED ORDER — DROPERIDOL 2.5 MG/ML IJ SOLN: 0.6250 mg | Freq: Once | INTRAMUSCULAR | Status: DC | PRN

## 2023-03-22 MED ORDER — 0.9 % SODIUM CHLORIDE (POUR BTL) OPTIME
TOPICAL | Status: DC | PRN
  Administered 2023-03-22: 500 mL

## 2023-03-22 MED ORDER — ONDANSETRON HCL 4 MG/2ML IJ SOLN: 4.0000 mg | Freq: Four times a day (QID) | INTRAMUSCULAR | Status: DC | PRN

## 2023-03-22 MED ORDER — FENTANYL CITRATE (PF) 100 MCG/2ML IJ SOLN
25.0000 ug | INTRAMUSCULAR | Status: DC | PRN
  Administered 2023-03-22 (×4): 25 ug via INTRAVENOUS

## 2023-03-22 MED ORDER — FENTANYL CITRATE (PF) 100 MCG/2ML IJ SOLN
INTRAMUSCULAR | Status: AC
  Filled 2023-03-22: qty 2

## 2023-03-22 MED ORDER — FENTANYL CITRATE (PF) 100 MCG/2ML IJ SOLN
INTRAMUSCULAR | Status: DC | PRN
  Administered 2023-03-22 (×2): 50 ug via INTRAVENOUS
  Administered 2023-03-22 (×2): 25 ug via INTRAVENOUS
  Administered 2023-03-22: 50 ug via INTRAVENOUS

## 2023-03-22 MED ORDER — PREGABALIN 75 MG PO CAPS
200.0000 mg | ORAL_CAPSULE | Freq: Three times a day (TID) | ORAL | Status: DC
  Administered 2023-03-22 – 2023-04-17 (×78): 200 mg via ORAL
  Filled 2023-03-22 (×79): qty 1

## 2023-03-22 MED ORDER — PROPOFOL 10 MG/ML IV BOLUS
INTRAVENOUS | Status: AC
  Filled 2023-03-22: qty 20

## 2023-03-22 MED ORDER — CHLORHEXIDINE GLUCONATE CLOTH 2 % EX PADS: 6.0000 | MEDICATED_PAD | Freq: Once | CUTANEOUS | Status: DC

## 2023-03-22 MED ORDER — DEXAMETHASONE SODIUM PHOSPHATE 10 MG/ML IJ SOLN
INTRAMUSCULAR | Status: DC | PRN
  Administered 2023-03-22: 10 mg via INTRAVENOUS

## 2023-03-22 MED ORDER — HYDROMORPHONE HCL 1 MG/ML IJ SOLN
1.0000 mg | Freq: Once | INTRAMUSCULAR | Status: AC | PRN
  Administered 2023-03-23: 1 mg via INTRAVENOUS
  Filled 2023-03-22: qty 1

## 2023-03-22 MED ORDER — FENTANYL CITRATE (PF) 100 MCG/2ML IJ SOLN
INTRAMUSCULAR | Status: AC
  Filled 2023-03-22: qty 4

## 2023-03-22 MED ORDER — LIDOCAINE HCL (CARDIAC) PF 100 MG/5ML IV SOSY
PREFILLED_SYRINGE | INTRAVENOUS | Status: DC | PRN
  Administered 2023-03-22: 40 mg via INTRAVENOUS

## 2023-03-22 MED ORDER — MIDAZOLAM HCL 2 MG/2ML IJ SOLN
1.0000 mg | Freq: Once | INTRAMUSCULAR | Status: AC
  Administered 2023-03-22: 1 mg via INTRAVENOUS

## 2023-03-22 MED ORDER — MIDAZOLAM HCL 2 MG/2ML IJ SOLN
INTRAMUSCULAR | Status: AC
  Filled 2023-03-22: qty 2

## 2023-03-22 MED ORDER — LACTATED RINGERS IV SOLN: INTRAVENOUS | Status: DC | PRN

## 2023-03-22 MED ORDER — EPHEDRINE SULFATE (PRESSORS) 50 MG/ML IJ SOLN
INTRAMUSCULAR | Status: DC | PRN
  Administered 2023-03-22 (×3): 10 mg via INTRAVENOUS

## 2023-03-22 MED ORDER — ONDANSETRON HCL 4 MG/2ML IJ SOLN
INTRAMUSCULAR | Status: DC | PRN
  Administered 2023-03-22: 4 mg via INTRAVENOUS

## 2023-03-22 MED ORDER — ACETAMINOPHEN 10 MG/ML IV SOLN
1000.0000 mg | Freq: Once | INTRAVENOUS | Status: DC | PRN
  Administered 2023-03-22: 1000 mg via INTRAVENOUS

## 2023-03-22 MED ORDER — ACETAMINOPHEN 10 MG/ML IV SOLN
INTRAVENOUS | Status: AC
  Filled 2023-03-22: qty 100

## 2023-03-22 MED ORDER — PROMETHAZINE HCL 25 MG/ML IJ SOLN: 6.2500 mg | INTRAMUSCULAR | Status: DC | PRN

## 2023-03-22 MED ORDER — OXYCODONE HCL 5 MG PO TABS
5.0000 mg | ORAL_TABLET | Freq: Once | ORAL | Status: AC | PRN
  Administered 2023-03-22: 5 mg via ORAL

## 2023-03-22 MED ORDER — MIDAZOLAM HCL 2 MG/2ML IJ SOLN
1.0000 mg | Freq: Once | INTRAMUSCULAR | Status: AC | PRN
  Administered 2023-03-22: 1 mg via INTRAVENOUS

## 2023-03-22 MED ORDER — OXYCODONE HCL 5 MG/5ML PO SOLN: 5.0000 mg | Freq: Once | ORAL | Status: AC | PRN

## 2023-03-22 MED ORDER — OXYCODONE HCL 5 MG PO TABS
ORAL_TABLET | ORAL | Status: AC
  Filled 2023-03-22: qty 1

## 2023-03-22 MED ORDER — CHLORHEXIDINE GLUCONATE CLOTH 2 % EX PADS
6.0000 | MEDICATED_PAD | Freq: Once | CUTANEOUS | Status: DC
  Administered 2023-03-22: 6 via TOPICAL
  Filled 2023-03-22: qty 6

## 2023-03-22 MED ORDER — APIXABAN 5 MG PO TABS
5.0000 mg | ORAL_TABLET | Freq: Two times a day (BID) | ORAL | Status: DC
  Administered 2023-03-22 – 2023-04-17 (×53): 5 mg via ORAL
  Filled 2023-03-22 (×54): qty 1

## 2023-03-22 MED ORDER — PROPOFOL 10 MG/ML IV BOLUS
INTRAVENOUS | Status: DC | PRN
  Administered 2023-03-22: 100 mg via INTRAVENOUS
  Administered 2023-03-22: 50 mg via INTRAVENOUS

## 2023-03-22 MED ORDER — MIDAZOLAM HCL 2 MG/2ML IJ SOLN
1.0000 mg | INTRAMUSCULAR | Status: DC | PRN
  Administered 2023-03-22: 1 mg via INTRAVENOUS

## 2023-03-22 SURGICAL SUPPLY — 61 items
AGENT HMST PWDR BTL CLGN 5GM (Miscellaneous) ×2 IMPLANT
APL PRP STRL LF DISP 70% ISPRP (MISCELLANEOUS) ×2
BLADE SAGITTAL WIDE XTHICK NO (BLADE) ×1 IMPLANT
BNDG CMPR 5X4 CHSV STRCH STRL (GAUZE/BANDAGES/DRESSINGS) ×2
BNDG CMPR STD VLCR NS LF 5.8X6 (GAUZE/BANDAGES/DRESSINGS) ×2
BNDG COHESIVE 4X5 TAN STRL LF (GAUZE/BANDAGES/DRESSINGS) ×2 IMPLANT
BNDG ELASTIC 6X5.8 VLCR NS LF (GAUZE/BANDAGES/DRESSINGS) ×2 IMPLANT
BNDG GAUZE DERMACEA FLUFF 4 (GAUZE/BANDAGES/DRESSINGS) ×4 IMPLANT
BNDG GZE DERMACEA 4 6PLY (GAUZE/BANDAGES/DRESSINGS) ×4
BRUSH SCRUB EZ  4% CHG (MISCELLANEOUS) ×2
BRUSH SCRUB EZ 4% CHG (MISCELLANEOUS) ×2 IMPLANT
CANISTER WOUND CARE 500ML ATS (WOUND CARE) ×1 IMPLANT
CHLORAPREP W/TINT 26 (MISCELLANEOUS) ×2 IMPLANT
COLLAGEN CELLERATERX 5 GRAM (Miscellaneous) ×1 IMPLANT
DRAPE INCISE IOBAN 66X45 STRL (DRAPES) ×2 IMPLANT
DRAPE INCISE IOBAN 66X60 STRL (DRAPES) ×2 IMPLANT
DRSG EMULSION OIL 3X8 NADH (GAUZE/BANDAGES/DRESSINGS) ×1 IMPLANT
DRSG VAC GRANUFOAM MED (GAUZE/BANDAGES/DRESSINGS) ×1 IMPLANT
ELECT CAUTERY BLADE 6.4 (BLADE) ×2 IMPLANT
ELECT REM PT RETURN 9FT ADLT (ELECTROSURGICAL) ×2
ELECTRODE REM PT RTRN 9FT ADLT (ELECTROSURGICAL) ×2 IMPLANT
GAUZE XEROFORM 1X8 LF (GAUZE/BANDAGES/DRESSINGS) ×4 IMPLANT
GLOVE BIO SURGEON STRL SZ7 (GLOVE) ×4 IMPLANT
GOWN STRL REUS W/ TWL LRG LVL3 (GOWN DISPOSABLE) ×4 IMPLANT
GOWN STRL REUS W/ TWL XL LVL3 (GOWN DISPOSABLE) ×2 IMPLANT
GOWN STRL REUS W/TWL LRG LVL3 (GOWN DISPOSABLE) ×4
GOWN STRL REUS W/TWL XL LVL3 (GOWN DISPOSABLE) ×2
HANDLE YANKAUER SUCT BULB TIP (MISCELLANEOUS) ×2 IMPLANT
IV NS 1000ML (IV SOLUTION)
IV NS 1000ML BAXH (IV SOLUTION) ×1 IMPLANT
KIT TURNOVER KIT A (KITS) ×2 IMPLANT
LABEL OR SOLS (LABEL) ×2 IMPLANT
MANIFOLD NEPTUNE II (INSTRUMENTS) ×2 IMPLANT
MAT ABSORB  FLUID 56X50 GRAY (MISCELLANEOUS) ×2
MAT ABSORB FLUID 56X50 GRAY (MISCELLANEOUS) ×2 IMPLANT
NS IRRIG 1000ML POUR BTL (IV SOLUTION) ×2 IMPLANT
NS IRRIG 500ML POUR BTL (IV SOLUTION) ×2 IMPLANT
PACK EXTREMITY ARMC (MISCELLANEOUS) ×2 IMPLANT
PAD ABD DERMACEA PRESS 5X9 (GAUZE/BANDAGES/DRESSINGS) ×4 IMPLANT
PAD PREP OB/GYN DISP 24X41 (PERSONAL CARE ITEMS) ×2 IMPLANT
PULSAVAC PLUS IRRIG FAN TIP (DISPOSABLE)
SOL PREP PVP 2OZ (MISCELLANEOUS) ×2
SOLUTION PREP PVP 2OZ (MISCELLANEOUS) ×2 IMPLANT
SPONGE T-LAP 18X18 ~~LOC~~+RFID (SPONGE) ×4 IMPLANT
STAPLER SKIN PROX 35W (STAPLE) ×2 IMPLANT
STOCKINETTE M/LG 89821 (MISCELLANEOUS) ×2 IMPLANT
SUT ETHILON 4-0 (SUTURE)
SUT ETHILON 4-0 FS2 18XMFL BLK (SUTURE)
SUT SILK 2 0 (SUTURE)
SUT SILK 2 0 SH (SUTURE) ×2 IMPLANT
SUT SILK 2-0 18XBRD TIE 12 (SUTURE) ×1 IMPLANT
SUT VIC AB 0 CT1 36 (SUTURE) ×2 IMPLANT
SUT VIC AB 2-0 CT1 (SUTURE) ×2 IMPLANT
SUT VIC AB 3-0 SH 27 (SUTURE)
SUT VIC AB 3-0 SH 27X BRD (SUTURE) IMPLANT
SUTURE ETHLN 4-0 FS2 18XMF BLK (SUTURE) IMPLANT
SWAB CULTURE AMIES ANAERIB BLU (MISCELLANEOUS) ×1 IMPLANT
SYR BULB IRRIG 60ML STRL (SYRINGE) ×2 IMPLANT
TIP FAN IRRIG PULSAVAC PLUS (DISPOSABLE) ×1 IMPLANT
TRAP FLUID SMOKE EVACUATOR (MISCELLANEOUS) ×2 IMPLANT
WATER STERILE IRR 500ML POUR (IV SOLUTION) ×2 IMPLANT

## 2023-03-22 NOTE — Anesthesia Procedure Notes (Signed)
Procedure Name: LMA Insertion Date/Time: 03/22/2023 11:20 AM  Performed by: Lily Lovings, CRNAPre-anesthesia Checklist: Patient identified, Patient being monitored, Timeout performed, Emergency Drugs available and Suction available Patient Re-evaluated:Patient Re-evaluated prior to induction Oxygen Delivery Method: Circle system utilized Preoxygenation: Pre-oxygenation with 100% oxygen Induction Type: IV induction Ventilation: Mask ventilation without difficulty LMA: LMA inserted LMA Size: 4.0 Tube type: Oral Number of attempts: 1 Placement Confirmation: positive ETCO2 and breath sounds checked- equal and bilateral Tube secured with: Tape Dental Injury: Teeth and Oropharynx as per pre-operative assessment

## 2023-03-22 NOTE — Assessment & Plan Note (Addendum)
-   Continue Eliquis 

## 2023-03-22 NOTE — Progress Notes (Signed)
  Progress Note   Patient: DILIA TARVER ZOX:[id number] DOB: 1961-06-13 DOA: 03/21/2023     1 DOS: the patient was seen and examined on 03/22/2023   Brief hospital course: Ms. Alayza Merced is a 62 year old female with history of depression, anxiety, hyperlipidemia, hypertension, tobacco use, paroxysmal atrial fibrillation on Eliquis, obesity, atherosclerosis, CAD, PAD, history of left stump infection post amputation, who presented on 03/21/2023 from vascular outpatient for due to necrosis of the left AKA stump.  Pt was admitted and started on IV antibiotics. Vascular surgery was consulted and took patient to the OR on 5/30 for debridement and wound vac placement.   Plan is to return to OR next Wednesday, 03/28/23.  Assessment and Plan: * Amputation stump infection [facility]) Vascular service consulted. 5/30 - taken to OR for debridement and wound vac placement Plan is return to OR Wed 6/5 for first wound vac change. Continue IV antibiotics.  Chronic anticoagulation Eliquis held on admission. Resume when okay from vascular surgery's standpoint.  PVD (peripheral vascular disease) (HCC) Complicated by bilateral AKA's. Mgmt per vascular surgery  Paroxysmal atrial fibrillation (HCC) HR's controlled. Eliquis on hold, resume when okay with vascular. Continue metoprolol  COPD (chronic obstructive pulmonary disease) (HCC) Stable, not exacerbated. Continue bronchodilators PRN  Recurrent major depressive disorder (HCC) Appears no longer on Celexa  Left thigh pain Due to AKA stump infection, with underlying chronic pain syndrome --Pain control per orders  Cellulitis of left thigh See amputation stump infection  Anxiety and depression Appears no longer on Celexa  Adjustment disorder with anxiety Appears no longer taking Celexa  Chronic pain syndrome Continue home Lyrica. Acute pain control with PRN oxycodone, IV Dilaudid for breakthrough pain  GERD (gastroesophageal reflux  disease) Continue PPI  Essential hypertension Continue home Imdur, metoprolol         Subjective: Pt seen in the ED holding for a bed, before going to OR today.  She reports ongoing pain, intermittent chills.  No other acute complaints.   Physical Exam: Vitals:   03/22/23 1230 03/22/23 1245 03/22/23 1300 03/22/23 1340  BP: (!) 141/95 136/67 131/66 (!) 140/77  Pulse: 82 81 72 66  Resp: 12 16 10 15   Temp: (!) 97.4 F (36.3 C) (!) 97.5 F (36.4 C) (!) 97.5 F (36.4 C)   TempSrc:      SpO2: 92% 92% 93% 96%  Weight:       General exam: awake, alert, no acute distress, obese HEENT: moist mucus membranes, hearing grossly normal  Respiratory system: CTAB, no wheezes, rales or rhonchi, normal respiratory effort. Cardiovascular system: normal S1/S2, RRR,  Gastrointestinal system: soft, NT, ND Central nervous system: A&O x3. no gross focal neurologic deficits, normal speech Extremities: b/l AKA's with left stump staples intact with necrotic tissue Skin: dry, intact, normal temperature Psychiatry: normal mood, congruent affect, judgement and insight appear normal   Data Reviewed:  Notable labs ---  glucose 104, Cr 0.40, Ca 8.8, Hbg 10.4  Family Communication: None present  Disposition: Status is: Inpatient Remains inpatient appropriate because: ongoing vascular evaluation, remains on IV antibiotics, further OR procedure/s planned   Planned Discharge Destination: Home    Time spent: 42 minutes  Author: Pennie Banter, DO 03/22/2023 2:18 PM  For on call review [email or web address].

## 2023-03-22 NOTE — Interval H&P Note (Signed)
History and Physical Interval Note:  03/22/2023 10:31 AM  Carla [name]  has presented today for surgery, with the diagnosis of Left AKA Stump Debridement.  The various methods of treatment have been discussed with the patient and family. After consideration of risks, benefits and other options for treatment, the patient has consented to  Procedure(s): AMPUTATION ABOVE KNEE STUMP DEBIDEMENT (Left) IRRIGATION AND DEBRIDEMENT WOUND (Left) as a surgical intervention.  The patient's history has been reviewed, patient examined, no change in status, stable for surgery.  I have reviewed the patient's chart and labs.  Questions were answered to the patient's satisfaction.     Festus Barren

## 2023-03-22 NOTE — Assessment & Plan Note (Signed)
Appears no longer on Celexa

## 2023-03-22 NOTE — Assessment & Plan Note (Signed)
Appears no longer taking Celexa

## 2023-03-22 NOTE — Anesthesia Postprocedure Evaluation (Signed)
Anesthesia Post Note  Patient: LAYLIE DAMASO  Procedure(s) Performed: AMPUTATION ABOVE KNEE STUMP DEBIDEMENT (Left: Leg Upper) IRRIGATION AND DEBRIDEMENT WOUND (Left: Leg Upper)  Patient location during evaluation: PACU Anesthesia Type: General Level of consciousness: awake and alert Pain management: pain level controlled Vital Signs Assessment: post-procedure vital signs reviewed and stable Respiratory status: spontaneous breathing, nonlabored ventilation, respiratory function stable and patient connected to nasal cannula oxygen Cardiovascular status: blood pressure returned to baseline and stable Postop Assessment: no apparent nausea or vomiting Anesthetic complications: no   No notable events documented.   Last Vitals:  Vitals:   03/22/23 1006 03/22/23 1215  BP: 119/74 (!) 140/92  Pulse:  95  Resp: 16 10  Temp: 36.7 C   SpO2: 96% 98%    Last Pain:  Vitals:   03/22/23 1006  TempSrc: Oral  PainSc: 10-Worst pain ever                 Yevette Edwards

## 2023-03-22 NOTE — Consult Note (Signed)
Hospital Consult    Reason for Consult:  Left Thigh Stump Infection Requesting Physician: Dr Londell Moh MD MRN #:  [id number]  History of Present Illness: This is a 62 y.o. female with history of depression, anxiety, hyperlipidemia, hypertension, tobacco use, paroxysmal atrial fibrillation on Eliquis, obesity, atherosclerosis, CAD, PAD, history of left stump infection post amputation, who presents emergency department from vascular outpatient for chief concerns of necrosis at the left thigh stump.   Patient is resting comfortably this morning.  Patient does endorse pain to her left stump.  Patient is requiring pain medicine.  She denies any chest pain shortness of breath dizziness nausea or vomiting.  Past Medical History:  Diagnosis Date   Abdominal aortic atherosclerosis (HCC)    a. 05/2017 CTA abd/pelvis: significant atherosclerotic dzs of the infrarenal abd Ao w/ some mural thrombus. No aneurysm or dissection.   Acute focal ischemia of small intestine (HCC)    Acute right-sided low back pain with right-sided sciatica 06/13/2017   AKI (acute kidney injury) (HCC) 07/29/2017   Anemia    Anginal pain (HCC)    a. 08/2012 Lexiscan MV: EF 54%, non ischemia/infarct.   Anxiety and depression    Baker's cyst of knee, right    a. 07/2016 U/S: 4.1 x 1.4 x 2.9 cystic structure in R poplitetal fossa.   Bell's palsy    CAD (coronary artery disease)    CHF (congestive heart failure) (HCC)    a.) TTE 06/20/2017: EF 60-65%, no rwma, G1DD, no source of cardiac emboli.   Chronic anticoagulation    a.) apixaban   Chronic idiopathic constipation    Chronic mesenteric ischemia (HCC)    Chronic pain    COPD (chronic obstructive pulmonary disease) (HCC)    Dyspnea    Embolus of superior mesenteric artery (HCC)    a. 05/2017 CTA Abd/pelvis: apparent thrombus or embolus in prox SMA (70-90%); b. 05/2017 catheter directed tPA, mechanical thrombectomy, and stenting of the SMA.   Essential hypertension with  goal blood pressure less than 140/90 01/19/2016   Gastroesophageal reflux disease 01/19/2016   GERD (gastroesophageal reflux disease)    H/O colectomy    History of 2019 novel coronavirus disease (COVID-19) 11/14/2019   History of kidney stones    Hyperlipidemia    Hypertension    Morbid obesity with BMI of 40.0-44.9, adult (HCC)    NSTEMI (non-ST elevated myocardial infarction) (HCC) 06/27/2021   a.) high sensitivity troponins trended: 893 --> 1587 --> 1748 --> 868 --> 735 ng/L   Occlusive mesenteric ischemia (HCC) 06/19/2017   Osteoarthritis    PAD (peripheral artery disease) (HCC)    PAF (paroxysmal atrial fibrillation) (HCC)    a.) CHA2DS2-VASc = 6 (sex, CHF, HTN, DVT x2, aortic plaque). b.) rate/rhythm maintained on oral metoprolol tartrate; chronically anticoagulated with standard dose apixaban.   Palpitations    Peripheral edema    Personal history of other malignant neoplasm of skin 06/01/2016   Pneumonia    Pre-diabetes    Primary osteoarthritis of both knees 06/13/2017   SBO (small bowel obstruction) (HCC) 08/06/2017   Skin cancer of nose    Superior mesenteric artery thrombosis (HCC)    Vitamin D deficiency     Past Surgical History:  Procedure Laterality Date   AMPUTATION Right 04/06/2022   Procedure: AMPUTATION ABOVE KNEE;  Surgeon: Annice Needy, MD;  Location: [facility];  Service: Vascular;  Laterality: Right;   AMPUTATION  12/07/2022   Procedure: AMPUTATION ABOVE KNEE REVISION WITH RESECTION OF  FEMUR;  Surgeon: Annice Needy, MD;  Location: [facility];  Service: Vascular;;   AMPUTATION Left 02/21/2023   Procedure: AMPUTATION ABOVE KNEE;  Surgeon: Annice Needy, MD;  Location: [facility];  Service: General;  Laterality: Left;   AMPUTATION TOE Left 12/09/2019   Procedure: AMPUTATION TOE MPJ left;  Surgeon: Rosetta Posner, DPM;  Location: [facility];  Service: Podiatry;  Laterality: Left;   APPENDECTOMY     APPLICATION OF WOUND VAC  12/01/2021   Procedure: APPLICATION OF WOUND  VAC;  Surgeon: Annice Needy, MD;  Location: [facility];  Service: Vascular;;   APPLICATION OF WOUND VAC Right 05/03/2022   Procedure: APPLICATION OF WOUND VAC;  Surgeon: Annice Needy, MD;  Location: [facility];  Service: Vascular;  Laterality: Right;   APPLICATION OF WOUND VAC Left 07/23/2022   Procedure: APPLICATION OF WOUND VAC;  Surgeon: Nada Libman, MD;  Location: [facility];  Service: Vascular;  Laterality: Left;  Prevena   APPLICATION OF WOUND VAC Right 08/28/2022   Procedure: APPLICATION OF WOUND VAC;  Surgeon: Annice Needy, MD;  Location: [facility];  Service: Vascular;  Laterality: Right;   APPLICATION OF WOUND VAC  12/07/2022   Procedure: APPLICATION OF WOUND VAC;  Surgeon: Annice Needy, MD;  Location: [facility];  Service: Vascular;;   AXILLARY-FEMORAL BYPASS GRAFT Left 07/23/2022   Procedure: BYPASS GRAFT AXILLA-BIFEMORAL;  Surgeon: Nada Libman, MD;  Location: [facility];  Service: Vascular;  Laterality: Left;   CHOLECYSTECTOMY     COLON SURGERY     COLONOSCOPY WITH PROPOFOL N/A 03/23/2022   Procedure: COLONOSCOPY WITH PROPOFOL;  Surgeon: Wyline Mood, MD;  Location: [facility];  Service: Gastroenterology;  Laterality: N/A;  rectal bleed   CORONARY/GRAFT ACUTE MI REVASCULARIZATION N/A 02/15/2023   Procedure: Coronary/Graft Acute MI Revascularization;  Surgeon: Marykay Lex, MD;  Location: [facility];  Service: Cardiovascular;  Laterality: N/A;   EMBOLECTOMY OR THROMBECTOMY, WITH OR WITHOUT CATHETER; FEMOROPOPLITEAL, AORTOILIAC ARTERY, BY LEG INCISION N/A 06/21/2020   Left - Decomp Fasciotomy Leg; Ant &/Or Lat Compart Only; Midline - Angiography, Visceral, Selective Or Supraselective (With Or Without Flush Aortogram); Left - Revascularize, Endovasc, Open/Percut, Iliac Artery, Unilat, Initial Vessel; W/Translum Stent, W/Angioplasty; Right - Revascularize, Endovasc, Open/Percut, Iliac Artery, Ea Add`L Ipsilateral; W/Translumin Stent, W/Angioplasty; Location [facility];   ENDARTERECTOMY  FEMORAL Left 12/01/2021   Procedure: ENDARTERECTOMY FEMORAL;  Surgeon: Annice Needy, MD;  Location: [facility];  Service: Vascular;  Laterality: Left;   ENDOVASCULAR REPAIR/STENT GRAFT Left 12/01/2021   Procedure: ENDOVASCULAR REPAIR/STENT GRAFT;  Surgeon: Annice Needy, MD;  Location: [facility];  Service: Cardiovascular;  Laterality: Left;   INCISION AND DRAINAGE OF WOUND Right 05/03/2022   Procedure: IRRIGATION AND DEBRIDEMENT RIGHT STUMP;  Surgeon: Annice Needy, MD;  Location: [facility];  Service: Vascular;  Laterality: Right;   IRRIGATION AND DEBRIDEMENT ABSCESS Right 08/28/2022   Procedure: AKA IRRIGATION AND DEBRIDEMENT ABSCESS;  Surgeon: Annice Needy, MD;  Location: [facility];  Service: Vascular;  Laterality: Right;   LAPAROTOMY N/A 08/08/2017   Procedure: EXPLORATORY LAPAROTOMY POSSIBLE BOWEL RESECTION;  Surgeon: Leafy Ro, MD;  Location: [facility];  Service: General;  Laterality: N/A;   LEFT HEART CATH AND CORONARY ANGIOGRAPHY N/A 05/17/2021   Procedure: LEFT HEART CATH AND CORONARY ANGIOGRAPHY;  Surgeon: Alwyn Pea, MD;  Location: [facility];  Service: Cardiovascular;  Laterality: N/A;   LEFT HEART CATH AND CORONARY ANGIOGRAPHY N/A 02/15/2023  Procedure: LEFT HEART CATH AND CORONARY ANGIOGRAPHY;  Surgeon: Marykay Lex, MD;  Location[facility];  Service: Cardiovascular;  Laterality: N/A;   LOWER EXTREMITY ANGIOGRAPHY Left 11/17/2019   Procedure: LOWER EXTREMITY ANGIOGRAPHY;  Surgeon: Annice Needy, MD;  Location: [facility];  Service: Cardiovascular;  Laterality: Left;   LOWER EXTREMITY ANGIOGRAPHY Right 12/13/2020   Procedure: LOWER EXTREMITY ANGIOGRAPHY;  Surgeon: Annice Needy, MD;  Location: [facility];  Service: Cardiovascular;  Laterality: Right;   LOWER EXTREMITY ANGIOGRAPHY Right 01/10/2021   Procedure: LOWER EXTREMITY ANGIOGRAPHY;  Surgeon: Annice Needy, MD;  Location: [facility];  Service: Cardiovascular;   Laterality: Right;   LOWER EXTREMITY ANGIOGRAPHY Right 01/24/2021   Procedure: LOWER EXTREMITY ANGIOGRAPHY;  Surgeon: Annice Needy, MD;  Location: [facility];  Service: Cardiovascular;  Laterality: Right;   LOWER EXTREMITY ANGIOGRAPHY Bilateral 06/28/2021   Procedure: Lower Extremity Angiography;  Surgeon: Annice Needy, MD;  Location: [facility];  Service: Cardiovascular;  Laterality: Bilateral;   LOWER EXTREMITY ANGIOGRAPHY Right 11/28/2021   Procedure: LOWER EXTREMITY ANGIOGRAPHY;  Surgeon: Annice Needy, MD;  Location: [facility];  Service: Cardiovascular;  Laterality: Right;   LOWER EXTREMITY ANGIOGRAPHY Left 11/30/2021   Procedure: Lower Extremity Angiography;  Surgeon: Annice Needy, MD;  Location: [facility];  Service: Cardiovascular;  Laterality: Left;   LOWER EXTREMITY ANGIOGRAPHY Right 02/22/2022   Procedure: Lower Extremity Angiography;  Surgeon: Annice Needy, MD;  Location: [facility];  Service: Cardiovascular;  Laterality: Right;   LOWER EXTREMITY ANGIOGRAPHY Right 03/13/2022   Procedure: Lower Extremity Angiography;  Surgeon: Annice Needy, MD;  Location: [facility];  Service: Cardiovascular;  Laterality: Right;   LOWER EXTREMITY ANGIOGRAPHY Right 03/24/2022   Procedure: Lower Extremity Angiography;  Surgeon: Annice Needy, MD;  Location: [facility];  Service: Cardiovascular;  Laterality: Right;   LOWER EXTREMITY ANGIOGRAPHY Left 01/17/2023   Procedure: Lower Extremity Angiography;  Surgeon: Annice Needy, MD;  Location: [facility];  Service: Cardiovascular;  Laterality: Left;   LOWER EXTREMITY INTERVENTION N/A 11/19/2019   Procedure: LOWER EXTREMITY INTERVENTION;  Surgeon: Annice Needy, MD;  Location: [facility];  Service: Cardiovascular;  Laterality: N/A;   LOWER EXTREMITY INTERVENTION Bilateral 06/29/2021   Procedure: LOWER EXTREMITY INTERVENTION;  Surgeon: Annice Needy, MD;  Location: [facility];   Service: Cardiovascular;  Laterality: Bilateral;   LOWER EXTREMITY INTERVENTION Left 01/18/2023   Procedure: LOWER EXTREMITY INTERVENTION;  Surgeon: Annice Needy, MD;  Location: [facility];  Service: Cardiovascular;  Laterality: Left;   TEE WITHOUT CARDIOVERSION N/A 06/22/2017   Procedure: TRANSESOPHAGEAL ECHOCARDIOGRAM (TEE);  Surgeon: Iran Ouch, MD;  Location: [facility];  Service: Cardiovascular;  Laterality: N/A;   TOTAL KNEE ARTHROPLASTY Right 07/11/2018   Procedure: TOTAL KNEE ARTHROPLASTY;  Surgeon: Christena Flake, MD;  Location: [facility];  Service: Orthopedics;  Laterality: Right;   VAGINAL HYSTERECTOMY     VISCERAL ARTERY INTERVENTION N/A 06/20/2017   Procedure: Visceral Artery Intervention, possible aortic thrombectomy;  Surgeon: Annice Needy, MD;  Location: [facility];  Service: Cardiovascular;  Laterality: N/A;   VISCERAL ARTERY INTERVENTION N/A 01/28/2018   Procedure: VISCERAL ARTERY INTERVENTION;  Surgeon: Annice Needy, MD;  Location: [facility];  Service: Cardiovascular;  Laterality: N/A;    Allergies  Allergen Reactions   Gabapentin  Tremors    Bactrim [Sulfamethoxazole-Trimethoprim] Itching    Prior to Admission medications   Medication Sig Start Date End Date Taking? Authorizing Provider  apixaban (ELIQUIS) 5 MG TABS tablet Take 1 tablet (5 mg total) by mouth 2 (two) times daily. 01/25/21  Yes Stegmayer, Ranae Plumber, PA-C  ascorbic acid (VITAMIN C) 500 MG tablet Take 1 tablet (500 mg total) by mouth 2 (two) times daily. 02/24/23  Yes [name], MD  atorvastatin (LIPITOR) 80 MG tablet Take 80 mg by mouth at bedtime.   Yes [provider]  COMBIVENT RESPIMAT 20-100 MCG/ACT AERS respimat Inhale 1 puff into the lungs every 6 (six) hours as needed. 03/27/22  Yes [provider]  diphenoxylate-atropine (LOMOTIL) 2.5-0.025 MG tablet Take 1 tablet by mouth 4 (four) times daily. 02/11/23  Yes [provider]  esomeprazole  (NEXIUM) 20 MG capsule Take 20 mg by mouth daily. 07/06/22  Yes [provider]  ferrous sulfate 325 (65 FE) MG tablet Take 325 mg by mouth daily.   Yes [provider]  isosorbide mononitrate (IMDUR) 120 MG 24 hr tablet Take 1 tablet (120 mg total) by mouth daily. 02/25/23  Yes [name], MD  metoprolol tartrate (LOPRESSOR) 50 MG tablet Take 50 mg by mouth 2 (two) times daily. 02/11/23  Yes [provider]  oxyCODONE-acetaminophen (PERCOCET) 10-325 MG tablet Take 1 tablet by mouth every 6 (six) hours as needed for pain. 03/16/23  Yes [provider]  pregabalin (LYRICA) 200 MG capsule Take 1 capsule (200 mg total) by mouth in the morning, at noon, and at bedtime. 01/31/23 05/31/23 Yes Georgiana Spinner, NP  aspirin EC 81 MG tablet Take 81 mg by mouth daily. Swallow whole. Patient not taking: Reported on 03/21/2023    [provider]  citalopram (CELEXA) 40 MG tablet Take 1 tablet (40 mg total) by mouth daily. 02/04/22 09/07/22  Darlin Priestly, MD  cyanocobalamin 1000 MCG tablet Take 1 tablet (1,000 mcg total) by mouth daily. Patient not taking: Reported on 03/21/2023 02/25/23   [name], MD  feeding supplement (ENSURE ENLIVE / ENSURE PLUS) LIQD Take 237 mLs by mouth 2 (two) times daily between meals. 05/06/22   [name], MD  HYDROcodone-acetaminophen (NORCO) 10-325 MG tablet Take 1 tablet by mouth every 6 (six) hours as needed for up to 14 days. Patient not taking: Reported on 03/21/2023 03/08/23 03/22/23  Georgiana Spinner, NP  lidocaine (LIDODERM) 5 % Place 1 patch onto the skin daily. Remove & Discard patch within 12 hours or as directed by MD Patient not taking: Reported on 03/21/2023 08/11/22   Arnetha Courser, MD  nicotine (NICODERM CQ - DOSED IN MG/24 HOURS) 21 mg/24hr patch Place 1 patch (21 mg total) onto the skin daily. Patient not taking: Reported on 03/21/2023 03/08/23   Georgiana Spinner, NP  zinc sulfate 220 (50 Zn) MG capsule Take 1 capsule (220 mg total) by  mouth daily. Patient not taking: Reported on 03/21/2023 02/24/23   [name], MD    Social History   Socioeconomic History   Marital status: Single    Spouse name: Not on file   Number of children: Not on file   Years of education: Not on file   Highest education level: Not on file  Occupational History   Occupation: Unemployed  Tobacco Use   Smoking status: Former    Packs/day: 1    Types: Cigarettes    Quit date: 2023    Years since quitting: 1.4  Smokeless tobacco: Never  Vaping Use   Vaping Use: Never used  Substance and Sexual Activity   Alcohol use: No   Drug use: No   Sexual activity: Not Currently  Other Topics Concern   Not on file  Social History Narrative   Lives in [place] with daughter   Social Determinants of Health   Financial Resource Strain: Not on file  Food Insecurity: No Food Insecurity (01/17/2023)   Hunger Vital Sign    Worried About Running Out of Food in the Last Year: Never true    Ran Out of Food in the Last Year: Never true  Transportation Needs: No Transportation Needs (01/17/2023)   PRAPARE - Administrator, Civil Service (Medical): No    Lack of Transportation (Non-Medical): No  Physical Activity: Not on file  Stress: Not on file  Social Connections: Not on file  Intimate Partner Violence: Not At Risk (01/17/2023)   Humiliation, Afraid, Rape, and Kick questionnaire    Fear of Current or Ex-Partner: No    Emotionally Abused: No    Physically Abused: No    Sexually Abused: No     Family History  Problem Relation Age of Onset   Hypertension Mother    Heart disease Mother    Heart attack Mother    Breast cancer Mother    Heart disease Father    Hypertension Father    Breast cancer Paternal Aunt     ROS: Otherwise negative unless mentioned in HPI  Physical Examination  Vitals:   03/22/23 0137 03/22/23 0400  BP:  111/67  Pulse: 71 73  Resp: 15 15  Temp:  98 F (36.7 C)  SpO2: 96% 92%   Body mass  index is 32.92 kg/m.  General:  WDWN in NAD Gait: Not observed HENT: WNL, normocephalic Pulmonary: normal non-labored breathing, without Rales, rhonchi,  wheezing Cardiac: irregular, HX of atrial fibrillation, without  Murmurs, rubs or gallops; without carotid bruits Abdomen: Positive bowel sounds, soft, NT/ND, no masses Skin: without rashes Vascular Exam/Pulses: Bilateral lower extremity AKA's.  Extremities: without ischemic changes, without Gangrene , with cellulitis; with open wounds;  Musculoskeletal: no muscle wasting or atrophy  Neurologic: A&O X 3;  No focal weakness or paresthesias are detected; speech is fluent/normal Psychiatric:  The pt has Normal affect. Lymph:  Unremarkable  CBC    Component Value Date/Time   WBC 6.7 03/22/2023 0542   RBC 3.88 03/22/2023 0542   HGB 10.4 (L) 03/22/2023 0542   HGB 13.4 02/12/2015 1137   HCT 33.8 (L) 03/22/2023 0542   HCT 40.9 02/12/2015 1137   PLT 289 03/22/2023 0542   PLT 272 02/12/2015 1137   MCV 87.1 03/22/2023 0542   MCV 90 02/12/2015 1137   MCH 26.8 03/22/2023 0542   MCHC 30.8 03/22/2023 0542   RDW 15.9 (H) 03/22/2023 0542   RDW 14.8 (H) 02/12/2015 1137   LYMPHSABS 1.6 02/16/2023 1141   LYMPHSABS 1.7 08/27/2014 1145   MONOABS 0.4 02/16/2023 1141   MONOABS 0.4 08/27/2014 1145   EOSABS 0.3 02/16/2023 1141   EOSABS 0.0 08/27/2014 1145   BASOSABS 0.0 02/16/2023 1141   BASOSABS 0.0 08/27/2014 1145    BMET    Component Value Date/Time   NA 140 03/22/2023 0542   NA 140 02/12/2015 1137   K 3.5 03/22/2023 0542   K 2.9 (L) 02/12/2015 1137   CL 109 03/22/2023 0542   CL 107 02/12/2015 1137   CO2 22 03/22/2023 0542  CO2 27 02/12/2015 1137   GLUCOSE 104 (H) 03/22/2023 0542   GLUCOSE 120 (H) 02/12/2015 1137   BUN 13 03/22/2023 0542   BUN 15 02/12/2015 1137   CREATININE 0.40 (L) 03/22/2023 0542   CREATININE 0.78 02/12/2015 1137   CALCIUM 8.8 (L) 03/22/2023 0542   CALCIUM 9.0 02/12/2015 1137   GFRNONAA >60 03/22/2023  0542   GFRNONAA >60 02/12/2015 1137   GFRAA >60 11/23/2019 1330   GFRAA >60 02/12/2015 1137    COAGS: Lab Results  Component Value Date   INR 1.6 (H) 03/21/2023   INR 1.2 02/15/2023   INR 1.1 12/04/2022     Non-Invasive Vascular Imaging:   EXAM:03/21/2023 CT OF THE LOWER LEFT EXTREMITY WITH CONTRAST   TECHNIQUE: Multidetector CT imaging of the lower left extremity was performed according to the standard protocol following intravenous contrast administration.   RADIATION DOSE REDUCTION: This exam was performed according to the departmental dose-optimization program which includes automated exposure control, adjustment of the mA and/or kV according to patient size and/or use of iterative reconstruction technique.   CONTRAST:  OMNIPAQUE IOHEXOL 300 MG/ML  SOLN   COMPARISON:  X-ray left femur 03/21/2023   FINDINGS: Vascular: Bilateral partially visualized common iliac artery stent with the left obstructed and right almost fully obstructed. Diminutive left common femoral and superficial femoral arteries with retrograde opacification from the inferior epigastric artery.   Bones/Joint/Cartilage   Above the knee amputation. No evidence of fracture, dislocation, or joint effusion. No evidence of severe arthropathy. No aggressive appearing focal bone abnormality.   Ligaments   Muscles and Tendons   Atrophic musculature. Suboptimally assessed by CT.   Soft tissues   Partially visualized large ventral wall hernia containing bowel with an abdominal defect of 12.5 cm. Skin staples overlying the amputation site with couple foci of gas likely postsurgical. Mild subcutaneus soft tissue edema. Organized fluid collection.   Other: Colonic diverticulosis.   IMPRESSION: 1. Bilateral partially visualized common iliac artery stent with the left obstructed and right almost fully obstructed. Diminutive left common femoral and superficial femoral arteries with  retrograde opacification from the inferior epigastric artery. 2. Above the knee amputation. Skin staples overlying the amputation site with couple foci of gas likely postsurgical.  Statin:  Yes.   Beta Blocker:  Yes.   Aspirin:  Yes.   ACEI:  No. ARB:  No. CCB use:  No Other antiplatelets/anticoagulants:  Yes.   Eliquis 5 mg bid   ASSESSMENT/PLAN: This is a 62 y.o. female who is now status post 1 month from 02/21/2023 from a left above-the-knee amputation.  He presents to [facility] emergency department after being seen in the vascular clinic for left stump infection.  She was started on antibiotics.  PLAN: Vascular surgery plans on taking the patient to the operating room later today 03/22/2023 for incision drainage and revision of the left stump infection with wound VAC placement.  I discussed in detail this morning with the patient the procedure, benefits, risks, and complications.  She verbalizes her understanding and wishes to proceed.  I answered all the patient's questions this morning.  Patient has been n.p.o. since midnight last night.  Vitals all remained stable   -I discussed the plan in detail with Dr. Festus Barren MD and he is in agreement with the plan.   [name] [facility] 03/22/2023 7:15 AM

## 2023-03-22 NOTE — Transfer of Care (Signed)
Immediate Anesthesia Transfer of Care Note  Patient: Carla [name]  Procedure(s) Performed: AMPUTATION ABOVE KNEE STUMP DEBIDEMENT (Left: Leg Upper) IRRIGATION AND DEBRIDEMENT WOUND (Left: Leg Upper)  Patient Location: PACU  Anesthesia Type:General  Level of Consciousness: awake and alert   Airway & Oxygen Therapy: Patient Spontanous Breathing and Patient connected to face mask oxygen  Post-op Assessment: Report given to RN and Post -op Vital signs reviewed and stable  Post vital signs: Reviewed and stable  Last Vitals:  Vitals Value Taken Time  BP 140/92 03/22/23 1215  Temp    Pulse 96 03/22/23 1216  Resp 13 03/22/23 1216  SpO2 98 % 03/22/23 1216  Vitals shown include unvalidated device data.  Last Pain:  Vitals:   03/22/23 1006  TempSrc: Oral  PainSc: 10-Worst pain ever      Patients Stated Pain Goal: 0 (03/22/23 0317)  Complications: No notable events documented.

## 2023-03-22 NOTE — Assessment & Plan Note (Addendum)
Continue home Lyrica. Switch to OxyContin 40 mg twice a day to Xtampza 36 mg twice a day upon discharge.  Increased oxycodone to 15 mg every 6 hours on 6/19.

## 2023-03-22 NOTE — Plan of Care (Signed)
  Problem: Education: Goal: Knowledge of General Education information will improve Description: Including pain rating scale, medication(s)/side effects and non-pharmacologic comfort measures Outcome: Progressing   Problem: Clinical Measurements: Goal: Will remain free from infection Outcome: Progressing   Problem: Clinical Measurements: Goal: Respiratory complications will improve Outcome: Progressing   Problem: Clinical Measurements: Goal: Cardiovascular complication will be avoided Outcome: Progressing   Problem: Nutrition: Goal: Adequate nutrition will be maintained Outcome: Progressing   Problem: Pain Managment: Goal: General experience of comfort will improve Outcome: Progressing   Problem: Safety: Goal: Ability to remain free from injury will improve Outcome: Progressing

## 2023-03-22 NOTE — Assessment & Plan Note (Signed)
Continue PPI ?

## 2023-03-22 NOTE — Op Note (Signed)
    OPERATIVE NOTE   PROCEDURE: Irrigation and debridement of left above-knee amputation stump of skin, soft tissue, and muscle for approximately 110 cm with application of negative pressure dressing (nondisposable)  PRE-OPERATIVE DIAGNOSIS: Nonviable tissue and infection of   left above-knee amputation stump  POST-OPERATIVE DIAGNOSIS: Same as above  SURGEON: Festus Barren, MD  ASSISTANT(S): None  ANESTHESIA: General  ESTIMATED BLOOD LOSS: 5 cc  FINDING(S): None  SPECIMEN(S): Swab sent for wound culture  INDICATIONS:   Carla [name] is a 63 y.o. 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  She is brought in for wound debridement and negative pressure dressing placement.  Risks and benefits are discussed.  DESCRIPTION: After obtaining full informed written consent, the patient was brought back to the operating room and placed supine upon the operating table.  The patient received IV antibiotics prior to induction.  After obtaining adequate anesthesia, the patient was prepped and draped in the standard fashion.  The wound was then opened and excisional debridement was performed to the skin, soft tissue, and a small amount of muscle fascia as well to remove all clearly non-viable tissue.  The tissue was taken back to bleeding tissue that appeared viable.  There is extensive nonviable adipose tissue that was debrided back.  The debridement was performed with Metzenbaum scissors and electrocautery and encompassed an area of approximately 110 cm2.  The wound measured about 22 to 23 cm in length and about 5 to 6 cm in width.  It had about 4 to 5 cm in depth at its deepest portion as well.  After all clearly non-viable tissue was removed, the wound was copiously irrigated with sterile saline.  I used 5 cc of celerate and then used Adaptic and a large VAC sponge cut to fit the wound.  The VAC sponge was tucked into  the wound bed and strips of Ioban were used again a good occlusive seal.  Seal was obtained once it was connected to suction tubing and was connected to 125 cm of suction continuously. The patient was then awakened from anesthesia and taken to the recovery room in stable condition having tolerated the procedure well.  COMPLICATIONS: none  CONDITION: stable  Festus Barren  03/22/2023, 12:19 PM   This note was created with Dragon Medical transcription system. Any errors in dictation are purely unintentional.

## 2023-03-22 NOTE — Assessment & Plan Note (Addendum)
-   Continue Eliquis and metoprolol 

## 2023-03-22 NOTE — Anesthesia Preprocedure Evaluation (Signed)
Anesthesia Evaluation  Patient identified by MRN, date of birth, ID band Patient awake    Reviewed: Allergy & Precautions, H&P , NPO status , Patient's Chart, lab work & pertinent test results, reviewed documented beta blocker date and time   Airway Mallampati: III  TM Distance: >3 FB Neck ROM: full    Dental  (+) Teeth Intact   Pulmonary shortness of breath and with exertion, pneumonia, resolved, COPD,  COPD inhaler, former smoker   Pulmonary exam normal        Cardiovascular Exercise Tolerance: Poor hypertension, On Medications + angina with exertion + CAD, + Past MI, + Peripheral Vascular Disease and +CHF  Normal cardiovascular exam Rate:Normal     Neuro/Psych  PSYCHIATRIC DISORDERS Anxiety Depression     Neuromuscular disease    GI/Hepatic Neg liver ROS,GERD  Medicated,,  Endo/Other  negative endocrine ROS    Renal/GU ESRFRenal disease  negative genitourinary   Musculoskeletal   Abdominal   Peds  Hematology  (+) Blood dyscrasia, anemia   Anesthesia Other Findings   Reproductive/Obstetrics negative OB ROS                             Anesthesia Physical Anesthesia Plan  ASA: 4 and emergent  Anesthesia Plan: General LMA   Post-op Pain Management:    Induction:   PONV Risk Score and Plan: 4 or greater  Airway Management Planned:   Additional Equipment:   Intra-op Plan:   Post-operative Plan:   Informed Consent: I have reviewed the patients History and Physical, chart, labs and discussed the procedure including the risks, benefits and alternatives for the proposed anesthesia with the patient or authorized representative who has indicated his/her understanding and acceptance.       Plan Discussed with: CRNA  Anesthesia Plan Comments:        Anesthesia Quick Evaluation

## 2023-03-22 NOTE — Assessment & Plan Note (Signed)
Appears no longer on Celexa 

## 2023-03-22 NOTE — H&P (View-Only) (Signed)
Hospital Consult    Reason for Consult:  Left Thigh Stump Infection Requesting Physician: Dr Amy Cox MD MRN #:  [id number]  History of Present Illness: This is a 62 y.o. female with history of depression, anxiety, hyperlipidemia, hypertension, tobacco use, paroxysmal atrial fibrillation on Eliquis, obesity, atherosclerosis, CAD, PAD, history of left stump infection post amputation, who presents emergency department from vascular outpatient for chief concerns of necrosis at the left thigh stump.   Patient is resting comfortably this morning.  Patient does endorse pain to her left stump.  Patient is requiring pain medicine.  She denies any chest pain shortness of breath dizziness nausea or vomiting.  Past Medical History:  Diagnosis Date   Abdominal aortic atherosclerosis (HCC)    a. 05/2017 CTA abd/pelvis: significant atherosclerotic dzs of the infrarenal abd Ao w/ some mural thrombus. No aneurysm or dissection.   Acute focal ischemia of small intestine (HCC)    Acute right-sided low back pain with right-sided sciatica 06/13/2017   AKI (acute kidney injury) (HCC) 07/29/2017   Anemia    Anginal pain (HCC)    a. 08/2012 Lexiscan MV: EF 54%, non ischemia/infarct.   Anxiety and depression    Baker's cyst of knee, right    a. 07/2016 U/S: 4.1 x 1.4 x 2.9 cystic structure in R poplitetal fossa.   Bell's palsy    CAD (coronary artery disease)    CHF (congestive heart failure) (HCC)    a.) TTE 06/20/2017: EF 60-65%, no rwma, G1DD, no source of cardiac emboli.   Chronic anticoagulation    a.) apixaban   Chronic idiopathic constipation    Chronic mesenteric ischemia (HCC)    Chronic pain    COPD (chronic obstructive pulmonary disease) (HCC)    Dyspnea    Embolus of superior mesenteric artery (HCC)    a. 05/2017 CTA Abd/pelvis: apparent thrombus or embolus in prox SMA (70-90%); b. 05/2017 catheter directed tPA, mechanical thrombectomy, and stenting of the SMA.   Essential hypertension with  goal blood pressure less than 140/90 01/19/2016   Gastroesophageal reflux disease 01/19/2016   GERD (gastroesophageal reflux disease)    H/O colectomy    History of 2019 novel coronavirus disease (COVID-19) 11/14/2019   History of kidney stones    Hyperlipidemia    Hypertension    Morbid obesity with BMI of 40.0-44.9, adult (HCC)    NSTEMI (non-ST elevated myocardial infarction) (HCC) 06/27/2021   a.) high sensitivity troponins trended: 893 --> 1587 --> 1748 --> 868 --> 735 ng/L   Occlusive mesenteric ischemia (HCC) 06/19/2017   Osteoarthritis    PAD (peripheral artery disease) (HCC)    PAF (paroxysmal atrial fibrillation) (HCC)    a.) CHA2DS2-VASc = 6 (sex, CHF, HTN, DVT x2, aortic plaque). b.) rate/rhythm maintained on oral metoprolol tartrate; chronically anticoagulated with standard dose apixaban.   Palpitations    Peripheral edema    Personal history of other malignant neoplasm of skin 06/01/2016   Pneumonia    Pre-diabetes    Primary osteoarthritis of both knees 06/13/2017   SBO (small bowel obstruction) (HCC) 08/06/2017   Skin cancer of nose    Superior mesenteric artery thrombosis (HCC)    Vitamin D deficiency     Past Surgical History:  Procedure Laterality Date   AMPUTATION Right 04/06/2022   Procedure: AMPUTATION ABOVE KNEE;  Surgeon: Dew, Jason S, MD;  Location: [facility];  Service: Vascular;  Laterality: Right;   AMPUTATION  12/07/2022   Procedure: AMPUTATION ABOVE KNEE REVISION WITH RESECTION OF   FEMUR;  Surgeon: Dew, Jason S, MD;  Location: [facility];  Service: Vascular;;   AMPUTATION Left 02/21/2023   Procedure: AMPUTATION ABOVE KNEE;  Surgeon: Dew, Jason S, MD;  Location: [facility];  Service: General;  Laterality: Left;   AMPUTATION TOE Left 12/09/2019   Procedure: AMPUTATION TOE MPJ left;  Surgeon: Baker, Andrew, DPM;  Location: [facility];  Service: Podiatry;  Laterality: Left;   APPENDECTOMY     APPLICATION OF WOUND VAC  12/01/2021   Procedure: APPLICATION OF WOUND  VAC;  Surgeon: Dew, Jason S, MD;  Location: [facility];  Service: Vascular;;   APPLICATION OF WOUND VAC Right 05/03/2022   Procedure: APPLICATION OF WOUND VAC;  Surgeon: Dew, Jason S, MD;  Location: [facility];  Service: Vascular;  Laterality: Right;   APPLICATION OF WOUND VAC Left 07/23/2022   Procedure: APPLICATION OF WOUND VAC;  Surgeon: Brabham, Vance W, MD;  Location: [facility];  Service: Vascular;  Laterality: Left;  Prevena   APPLICATION OF WOUND VAC Right 08/28/2022   Procedure: APPLICATION OF WOUND VAC;  Surgeon: Dew, Jason S, MD;  Location: [facility];  Service: Vascular;  Laterality: Right;   APPLICATION OF WOUND VAC  12/07/2022   Procedure: APPLICATION OF WOUND VAC;  Surgeon: Dew, Jason S, MD;  Location: [facility];  Service: Vascular;;   AXILLARY-FEMORAL BYPASS GRAFT Left 07/23/2022   Procedure: BYPASS GRAFT AXILLA-BIFEMORAL;  Surgeon: Brabham, Vance W, MD;  Location: [facility];  Service: Vascular;  Laterality: Left;   CHOLECYSTECTOMY     COLON SURGERY     COLONOSCOPY WITH PROPOFOL N/A 03/23/2022   Procedure: COLONOSCOPY WITH PROPOFOL;  Surgeon: Anna, Kiran, MD;  Location: [facility];  Service: Gastroenterology;  Laterality: N/A;  rectal bleed   CORONARY/GRAFT ACUTE MI REVASCULARIZATION N/A 02/15/2023   Procedure: Coronary/Graft Acute MI Revascularization;  Surgeon: Harding, David W, MD;  Location: [facility];  Service: Cardiovascular;  Laterality: N/A;   EMBOLECTOMY OR THROMBECTOMY, WITH OR WITHOUT CATHETER; FEMOROPOPLITEAL, AORTOILIAC ARTERY, BY LEG INCISION N/A 06/21/2020   Left - Decomp Fasciotomy Leg; Ant &/Or Lat Compart Only; Midline - Angiography, Visceral, Selective Or Supraselective (With Or Without Flush Aortogram); Left - Revascularize, Endovasc, Open/Percut, Iliac Artery, Unilat, Initial Vessel; W/Translum Stent, W/Angioplasty; Right - Revascularize, Endovasc, Open/Percut, Iliac Artery, Ea Add`L Ipsilateral; W/Translumin Stent, W/Angioplasty; Location [facility];   ENDARTERECTOMY  FEMORAL Left 12/01/2021   Procedure: ENDARTERECTOMY FEMORAL;  Surgeon: Dew, Jason S, MD;  Location: [facility];  Service: Vascular;  Laterality: Left;   ENDOVASCULAR REPAIR/STENT GRAFT Left 12/01/2021   Procedure: ENDOVASCULAR REPAIR/STENT GRAFT;  Surgeon: Dew, Jason S, MD;  Location: [facility];  Service: Cardiovascular;  Laterality: Left;   INCISION AND DRAINAGE OF WOUND Right 05/03/2022   Procedure: IRRIGATION AND DEBRIDEMENT RIGHT STUMP;  Surgeon: Dew, Jason S, MD;  Location: [facility];  Service: Vascular;  Laterality: Right;   IRRIGATION AND DEBRIDEMENT ABSCESS Right 08/28/2022   Procedure: AKA IRRIGATION AND DEBRIDEMENT ABSCESS;  Surgeon: Dew, Jason S, MD;  Location: [facility];  Service: Vascular;  Laterality: Right;   LAPAROTOMY N/A 08/08/2017   Procedure: EXPLORATORY LAPAROTOMY POSSIBLE BOWEL RESECTION;  Surgeon: Pabon, Diego F, MD;  Location: [facility];  Service: General;  Laterality: N/A;   LEFT HEART CATH AND CORONARY ANGIOGRAPHY N/A 05/17/2021   Procedure: LEFT HEART CATH AND CORONARY ANGIOGRAPHY;  Surgeon: Callwood, Dwayne D, MD;  Location: [facility];  Service: Cardiovascular;  Laterality: N/A;   LEFT HEART CATH AND CORONARY ANGIOGRAPHY N/A 02/15/2023     Procedure: LEFT HEART CATH AND CORONARY ANGIOGRAPHY;  Surgeon: Harding, David W, MD;  Location: [facility];  Service: Cardiovascular;  Laterality: N/A;   LOWER EXTREMITY ANGIOGRAPHY Left 11/17/2019   Procedure: LOWER EXTREMITY ANGIOGRAPHY;  Surgeon: Dew, Jason S, MD;  Location: [facility];  Service: Cardiovascular;  Laterality: Left;   LOWER EXTREMITY ANGIOGRAPHY Right 12/13/2020   Procedure: LOWER EXTREMITY ANGIOGRAPHY;  Surgeon: Dew, Jason S, MD;  Location: [facility];  Service: Cardiovascular;  Laterality: Right;   LOWER EXTREMITY ANGIOGRAPHY Right 01/10/2021   Procedure: LOWER EXTREMITY ANGIOGRAPHY;  Surgeon: Dew, Jason S, MD;  Location: [facility];  Service: Cardiovascular;   Laterality: Right;   LOWER EXTREMITY ANGIOGRAPHY Right 01/24/2021   Procedure: LOWER EXTREMITY ANGIOGRAPHY;  Surgeon: Dew, Jason S, MD;  Location: [facility];  Service: Cardiovascular;  Laterality: Right;   LOWER EXTREMITY ANGIOGRAPHY Bilateral 06/28/2021   Procedure: Lower Extremity Angiography;  Surgeon: Dew, Jason S, MD;  Location: [facility];  Service: Cardiovascular;  Laterality: Bilateral;   LOWER EXTREMITY ANGIOGRAPHY Right 11/28/2021   Procedure: LOWER EXTREMITY ANGIOGRAPHY;  Surgeon: Dew, Jason S, MD;  Location: [facility];  Service: Cardiovascular;  Laterality: Right;   LOWER EXTREMITY ANGIOGRAPHY Left 11/30/2021   Procedure: Lower Extremity Angiography;  Surgeon: Dew, Jason S, MD;  Location: [facility];  Service: Cardiovascular;  Laterality: Left;   LOWER EXTREMITY ANGIOGRAPHY Right 02/22/2022   Procedure: Lower Extremity Angiography;  Surgeon: Dew, Jason S, MD;  Location: [facility];  Service: Cardiovascular;  Laterality: Right;   LOWER EXTREMITY ANGIOGRAPHY Right 03/13/2022   Procedure: Lower Extremity Angiography;  Surgeon: Dew, Jason S, MD;  Location: [facility];  Service: Cardiovascular;  Laterality: Right;   LOWER EXTREMITY ANGIOGRAPHY Right 03/24/2022   Procedure: Lower Extremity Angiography;  Surgeon: Dew, Jason S, MD;  Location: [facility];  Service: Cardiovascular;  Laterality: Right;   LOWER EXTREMITY ANGIOGRAPHY Left 01/17/2023   Procedure: Lower Extremity Angiography;  Surgeon: Dew, Jason S, MD;  Location: [facility];  Service: Cardiovascular;  Laterality: Left;   LOWER EXTREMITY INTERVENTION N/A 11/19/2019   Procedure: LOWER EXTREMITY INTERVENTION;  Surgeon: Dew, Jason S, MD;  Location: [facility];  Service: Cardiovascular;  Laterality: N/A;   LOWER EXTREMITY INTERVENTION Bilateral 06/29/2021   Procedure: LOWER EXTREMITY INTERVENTION;  Surgeon: Dew, Jason S, MD;  Location: [facility];   Service: Cardiovascular;  Laterality: Bilateral;   LOWER EXTREMITY INTERVENTION Left 01/18/2023   Procedure: LOWER EXTREMITY INTERVENTION;  Surgeon: Dew, Jason S, MD;  Location: [facility];  Service: Cardiovascular;  Laterality: Left;   TEE WITHOUT CARDIOVERSION N/A 06/22/2017   Procedure: TRANSESOPHAGEAL ECHOCARDIOGRAM (TEE);  Surgeon: Arida, Muhammad A, MD;  Location: [facility];  Service: Cardiovascular;  Laterality: N/A;   TOTAL KNEE ARTHROPLASTY Right 07/11/2018   Procedure: TOTAL KNEE ARTHROPLASTY;  Surgeon: Poggi, John J, MD;  Location: [facility];  Service: Orthopedics;  Laterality: Right;   VAGINAL HYSTERECTOMY     VISCERAL ARTERY INTERVENTION N/A 06/20/2017   Procedure: Visceral Artery Intervention, possible aortic thrombectomy;  Surgeon: Dew, Jason S, MD;  Location: [facility];  Service: Cardiovascular;  Laterality: N/A;   VISCERAL ARTERY INTERVENTION N/A 01/28/2018   Procedure: VISCERAL ARTERY INTERVENTION;  Surgeon: Dew, Jason S, MD;  Location: [facility];  Service: Cardiovascular;  Laterality: N/A;    Allergies  Allergen Reactions   Gabapentin       Tremors    Bactrim [Sulfamethoxazole-Trimethoprim] Itching    Prior to Admission medications   Medication Sig Start Date End Date Taking? Authorizing Provider  apixaban (ELIQUIS) 5 MG TABS tablet Take 1 tablet (5 mg total) by mouth 2 (two) times daily. 01/25/21  Yes Stegmayer, Kimberly A, PA-C  ascorbic acid (VITAMIN C) 500 MG tablet Take 1 tablet (500 mg total) by mouth 2 (two) times daily. 02/24/23  Yes Patel, Sona, MD  atorvastatin (LIPITOR) 80 MG tablet Take 80 mg by mouth at bedtime.   Yes [provider]  COMBIVENT RESPIMAT 20-100 MCG/ACT AERS respimat Inhale 1 puff into the lungs every 6 (six) hours as needed. 03/27/22  Yes [provider]  diphenoxylate-atropine (LOMOTIL) 2.5-0.025 MG tablet Take 1 tablet by mouth 4 (four) times daily. 02/11/23  Yes [provider]  esomeprazole  (NEXIUM) 20 MG capsule Take 20 mg by mouth daily. 07/06/22  Yes [provider]  ferrous sulfate 325 (65 FE) MG tablet Take 325 mg by mouth daily.   Yes [provider]  isosorbide mononitrate (IMDUR) 120 MG 24 hr tablet Take 1 tablet (120 mg total) by mouth daily. 02/25/23  Yes Patel, Sona, MD  metoprolol tartrate (LOPRESSOR) 50 MG tablet Take 50 mg by mouth 2 (two) times daily. 02/11/23  Yes [provider]  oxyCODONE-acetaminophen (PERCOCET) 10-325 MG tablet Take 1 tablet by mouth every 6 (six) hours as needed for pain. 03/16/23  Yes [provider]  pregabalin (LYRICA) 200 MG capsule Take 1 capsule (200 mg total) by mouth in the morning, at noon, and at bedtime. 01/31/23 05/31/23 Yes Brown, Fallon E, NP  aspirin EC 81 MG tablet Take 81 mg by mouth daily. Swallow whole. Patient not taking: Reported on 03/21/2023    [provider]  citalopram (CELEXA) 40 MG tablet Take 1 tablet (40 mg total) by mouth daily. 02/04/22 09/07/22  Lai, Tina, MD  cyanocobalamin 1000 MCG tablet Take 1 tablet (1,000 mcg total) by mouth daily. Patient not taking: Reported on 03/21/2023 02/25/23   Patel, Sona, MD  feeding supplement (ENSURE ENLIVE / ENSURE PLUS) LIQD Take 237 mLs by mouth 2 (two) times daily between meals. 05/06/22   Patel, Sona, MD  HYDROcodone-acetaminophen (NORCO) 10-325 MG tablet Take 1 tablet by mouth every 6 (six) hours as needed for up to 14 days. Patient not taking: Reported on 03/21/2023 03/08/23 03/22/23  Brown, Fallon E, NP  lidocaine (LIDODERM) 5 % Place 1 patch onto the skin daily. Remove & Discard patch within 12 hours or as directed by MD Patient not taking: Reported on 03/21/2023 08/11/22   Amin, Sumayya, MD  nicotine (NICODERM CQ - DOSED IN MG/24 HOURS) 21 mg/24hr patch Place 1 patch (21 mg total) onto the skin daily. Patient not taking: Reported on 03/21/2023 03/08/23   Brown, Fallon E, NP  zinc sulfate 220 (50 Zn) MG capsule Take 1 capsule (220 mg total) by  mouth daily. Patient not taking: Reported on 03/21/2023 02/24/23   Patel, Sona, MD    Social History   Socioeconomic History   Marital status: Single    Spouse name: Not on file   Number of children: Not on file   Years of education: Not on file   Highest education level: Not on file  Occupational History   Occupation: Unemployed  Tobacco Use   Smoking status: Former    Packs/day: 1    Types: Cigarettes    Quit date: 2023    Years since quitting: 1.4     Smokeless tobacco: Never  Vaping Use   Vaping Use: Never used  Substance and Sexual Activity   Alcohol use: No   Drug use: No   Sexual activity: Not Currently  Other Topics Concern   Not on file  Social History Narrative   Lives in [place] with daughter   Social Determinants of Health   Financial Resource Strain: Not on file  Food Insecurity: No Food Insecurity (01/17/2023)   Hunger Vital Sign    Worried About Running Out of Food in the Last Year: Never true    Ran Out of Food in the Last Year: Never true  Transportation Needs: No Transportation Needs (01/17/2023)   PRAPARE - Transportation    Lack of Transportation (Medical): No    Lack of Transportation (Non-Medical): No  Physical Activity: Not on file  Stress: Not on file  Social Connections: Not on file  Intimate Partner Violence: Not At Risk (01/17/2023)   Humiliation, Afraid, Rape, and Kick questionnaire    Fear of Current or Ex-Partner: No    Emotionally Abused: No    Physically Abused: No    Sexually Abused: No     Family History  Problem Relation Age of Onset   Hypertension Mother    Heart disease Mother    Heart attack Mother    Breast cancer Mother    Heart disease Father    Hypertension Father    Breast cancer Paternal Aunt     ROS: Otherwise negative unless mentioned in HPI  Physical Examination  Vitals:   03/22/23 0137 03/22/23 0400  BP:  111/67  Pulse: 71 73  Resp: 15 15  Temp:  98 F (36.7 C)  SpO2: 96% 92%   Body mass  index is 32.92 kg/m.  General:  WDWN in NAD Gait: Not observed HENT: WNL, normocephalic Pulmonary: normal non-labored breathing, without Rales, rhonchi,  wheezing Cardiac: irregular, HX of atrial fibrillation, without  Murmurs, rubs or gallops; without carotid bruits Abdomen: Positive bowel sounds, soft, NT/ND, no masses Skin: without rashes Vascular Exam/Pulses: Bilateral lower extremity AKA's.  Extremities: without ischemic changes, without Gangrene , with cellulitis; with open wounds;  Musculoskeletal: no muscle wasting or atrophy  Neurologic: A&O X 3;  No focal weakness or paresthesias are detected; speech is fluent/normal Psychiatric:  The pt has Normal affect. Lymph:  Unremarkable  CBC    Component Value Date/Time   WBC 6.7 03/22/2023 0542   RBC 3.88 03/22/2023 0542   HGB 10.4 (L) 03/22/2023 0542   HGB 13.4 02/12/2015 1137   HCT 33.8 (L) 03/22/2023 0542   HCT 40.9 02/12/2015 1137   PLT 289 03/22/2023 0542   PLT 272 02/12/2015 1137   MCV 87.1 03/22/2023 0542   MCV 90 02/12/2015 1137   MCH 26.8 03/22/2023 0542   MCHC 30.8 03/22/2023 0542   RDW 15.9 (H) 03/22/2023 0542   RDW 14.8 (H) 02/12/2015 1137   LYMPHSABS 1.6 02/16/2023 1141   LYMPHSABS 1.7 08/27/2014 1145   MONOABS 0.4 02/16/2023 1141   MONOABS 0.4 08/27/2014 1145   EOSABS 0.3 02/16/2023 1141   EOSABS 0.0 08/27/2014 1145   BASOSABS 0.0 02/16/2023 1141   BASOSABS 0.0 08/27/2014 1145    BMET    Component Value Date/Time   NA 140 03/22/2023 0542   NA 140 02/12/2015 1137   K 3.5 03/22/2023 0542   K 2.9 (L) 02/12/2015 1137   CL 109 03/22/2023 0542   CL 107 02/12/2015 1137   CO2 22 03/22/2023 0542     CO2 27 02/12/2015 1137   GLUCOSE 104 (H) 03/22/2023 0542   GLUCOSE 120 (H) 02/12/2015 1137   BUN 13 03/22/2023 0542   BUN 15 02/12/2015 1137   CREATININE 0.40 (L) 03/22/2023 0542   CREATININE 0.78 02/12/2015 1137   CALCIUM 8.8 (L) 03/22/2023 0542   CALCIUM 9.0 02/12/2015 1137   GFRNONAA >60 03/22/2023  0542   GFRNONAA >60 02/12/2015 1137   GFRAA >60 11/23/2019 1330   GFRAA >60 02/12/2015 1137    COAGS: Lab Results  Component Value Date   INR 1.6 (H) 03/21/2023   INR 1.2 02/15/2023   INR 1.1 12/04/2022     Non-Invasive Vascular Imaging:   EXAM:03/21/2023 CT OF THE LOWER LEFT EXTREMITY WITH CONTRAST   TECHNIQUE: Multidetector CT imaging of the lower left extremity was performed according to the standard protocol following intravenous contrast administration.   RADIATION DOSE REDUCTION: This exam was performed according to the departmental dose-optimization program which includes automated exposure control, adjustment of the mA and/or kV according to patient size and/or use of iterative reconstruction technique.   CONTRAST:  100mL OMNIPAQUE IOHEXOL 300 MG/ML  SOLN   COMPARISON:  X-ray left femur 03/21/2023   FINDINGS: Vascular: Bilateral partially visualized common iliac artery stent with the left obstructed and right almost fully obstructed. Diminutive left common femoral and superficial femoral arteries with retrograde opacification from the inferior epigastric artery.   Bones/Joint/Cartilage   Above the knee amputation. No evidence of fracture, dislocation, or joint effusion. No evidence of severe arthropathy. No aggressive appearing focal bone abnormality.   Ligaments   Muscles and Tendons   Atrophic musculature. Suboptimally assessed by CT.   Soft tissues   Partially visualized large ventral wall hernia containing bowel with an abdominal defect of 12.5 cm. Skin staples overlying the amputation site with couple foci of gas likely postsurgical. Mild subcutaneus soft tissue edema. Organized fluid collection.   Other: Colonic diverticulosis.   IMPRESSION: 1. Bilateral partially visualized common iliac artery stent with the left obstructed and right almost fully obstructed. Diminutive left common femoral and superficial femoral arteries with  retrograde opacification from the inferior epigastric artery. 2. Above the knee amputation. Skin staples overlying the amputation site with couple foci of gas likely postsurgical.  Statin:  Yes.   Beta Blocker:  Yes.   Aspirin:  Yes.   ACEI:  No. ARB:  No. CCB use:  No Other antiplatelets/anticoagulants:  Yes.   Eliquis 5 mg bid   ASSESSMENT/PLAN: This is a 62 y.o. female who is now status post 1 month from 02/21/2023 from a left above-the-knee amputation.  He presents to [facility]'s emergency department after being seen in the vascular clinic for left stump infection.  She was started on antibiotics.  PLAN: Vascular surgery plans on taking the patient to the operating room later today 03/22/2023 for incision drainage and revision of the left stump infection with wound VAC placement.  I discussed in detail this morning with the patient the procedure, benefits, risks, and complications.  She verbalizes her understanding and wishes to proceed.  I answered all the patient's questions this morning.  Patient has been n.p.o. since midnight last night.  Vitals all remained stable   -I discussed the plan in detail with Dr. Jason Dew MD and he is in agreement with the plan.   Saloni Lablanc R Chela [facility] 03/22/2023 7:15 AM  

## 2023-03-23 DIAGNOSIS — T8740 Infection of amputation stump, unspecified extremity: Secondary | ICD-10-CM | POA: Diagnosis not present

## 2023-03-23 LAB — BASIC METABOLIC PANEL
Anion gap: 8 (ref 5–15)
BUN: 10 mg/dL (ref 8–23)
CO2: 20 mmol/L — ABNORMAL LOW (ref 22–32)
Calcium: 8.6 mg/dL — ABNORMAL LOW (ref 8.9–10.3)
Chloride: 112 mmol/L — ABNORMAL HIGH (ref 98–111)
Creatinine, Ser: 0.41 mg/dL — ABNORMAL LOW (ref 0.44–1.00)
GFR, Estimated: >60 mL/min (ref >60)
Glucose, Bld: 152 mg/dL — ABNORMAL HIGH (ref 70–99)
Potassium: 3.1 mmol/L — ABNORMAL LOW (ref 3.5–5.1)
Sodium: 140 mmol/L (ref 135–145)

## 2023-03-23 LAB — CBC
HCT: 31 % — ABNORMAL LOW (ref 36.0–46.0)
Hemoglobin: 9.7 g/dL — ABNORMAL LOW (ref 12.0–15.0)
MCH: 27.2 pg (ref 26.0–34.0)
MCHC: 31.3 g/dL (ref 30.0–36.0)
MCV: 86.8 fL (ref 80.0–100.0)
Platelets: 264 10*3/uL (ref 150–400)
RBC: 3.57 MIL/uL — ABNORMAL LOW (ref 3.87–5.11)
RDW: 15.5 % (ref 11.5–15.5)
WBC: 6.5 10*3/uL (ref 4.0–10.5)
nRBC: 0 % (ref 0.0–0.2)

## 2023-03-23 LAB — AEROBIC/ANAEROBIC CULTURE W GRAM STAIN (SURGICAL/DEEP WOUND)

## 2023-03-23 LAB — MAGNESIUM: Magnesium: 1.8 mg/dL (ref 1.7–2.4)

## 2023-03-23 LAB — CULTURE, BLOOD (ROUTINE X 2): Culture: NO GROWTH

## 2023-03-23 MED ORDER — OXYCODONE HCL 5 MG PO TABS
5.0000 mg | ORAL_TABLET | Freq: Three times a day (TID) | ORAL | Status: DC | PRN
  Administered 2023-03-23 – 2023-03-25 (×5): 10 mg via ORAL
  Filled 2023-03-23 (×4): qty 2

## 2023-03-23 MED ORDER — SENNOSIDES-DOCUSATE SODIUM 8.6-50 MG PO TABS
1.0000 | ORAL_TABLET | Freq: Two times a day (BID) | ORAL | Status: DC
  Administered 2023-03-23 – 2023-04-16 (×41): 1 via ORAL
  Filled 2023-03-23 (×47): qty 1

## 2023-03-23 MED ORDER — OXYCODONE HCL 5 MG PO TABS
10.0000 mg | ORAL_TABLET | Freq: Three times a day (TID) | ORAL | Status: DC
  Administered 2023-03-23: 10 mg via ORAL
  Filled 2023-03-23 (×2): qty 2

## 2023-03-23 MED ORDER — ACETAMINOPHEN 500 MG PO TABS
1000.0000 mg | ORAL_TABLET | Freq: Three times a day (TID) | ORAL | Status: DC
  Administered 2023-03-23 – 2023-04-17 (×69): 1000 mg via ORAL
  Filled 2023-03-23 (×69): qty 2

## 2023-03-23 MED ORDER — POTASSIUM CHLORIDE CRYS ER 20 MEQ PO TBCR
40.0000 meq | EXTENDED_RELEASE_TABLET | ORAL | Status: AC
  Administered 2023-03-23 (×2): 40 meq via ORAL
  Filled 2023-03-23 (×2): qty 2

## 2023-03-23 MED ORDER — POLYETHYLENE GLYCOL 3350 17 G PO PACK
17.0000 g | PACK | Freq: Every day | ORAL | Status: DC
  Administered 2023-03-23 – 2023-04-13 (×13): 17 g via ORAL
  Filled 2023-03-23 (×23): qty 1

## 2023-03-23 MED ORDER — HYDROMORPHONE HCL 1 MG/ML IJ SOLN
0.5000 mg | INTRAMUSCULAR | Status: DC | PRN
  Administered 2023-03-23 – 2023-03-30 (×29): 0.5 mg via INTRAVENOUS
  Filled 2023-03-23 (×29): qty 1

## 2023-03-23 MED ORDER — BISACODYL 5 MG PO TBEC: 5.0000 mg | DELAYED_RELEASE_TABLET | Freq: Every day | ORAL | Status: DC | PRN

## 2023-03-23 MED ORDER — ACETAMINOPHEN 650 MG RE SUPP
650.0000 mg | Freq: Three times a day (TID) | RECTAL | Status: DC
Start: 2023-03-23 — End: 2023-03-23

## 2023-03-23 NOTE — Consult Note (Signed)
Pharmacy Antibiotic Note  Carla [name] is a 62 y.o. female admitted on 03/21/2023 with  wound infection . PMH significant for HTN, GERD, AF, COPD, PVD. Patient was sent to ED by vascular given concern for possible osteomyelitis. Patient has received multiple IV antibiotics this year including meropenem, daptomycin, and ertapenem in February and March. She has also received other oral agents including omadacycline and metronidazole this year. Historical wound cultures have grown resistant bacteria including MRSA and ESBL E. coli. Surgical pathology from 5/1 AKA was negative for OM. She has a documented intolerance to Bactrim (itching). Pharmacy has been consulted for vancomycin and cefepime dosing.  Plan:  Continue cefepime 2 g IV q8h  Continue vancomycin 1500 mg IV q24h Goal AUC 400-550. Expected AUC: 494.3 Expected Css min: 11.2 SCr used: 0.8 (actual 0.41)  Weight used: IBW, Vd used: 0.72 (BMI 32.8) Check levels with vancomycin dose 6/1 at 1600  Continue to monitor renal function and follow culture results  Weight: 81.6 kg (180 lb)  Temp (24hrs), Avg:97.9 F (36.6 C), Min:97.4 F (36.3 C), Max:98.5 F (36.9 C)  Recent Labs  Lab 03/21/23 1412 03/21/23 1627 03/22/23 0542 03/23/23 0415  WBC 7.5  --  6.7 6.5  CREATININE 0.64  --  0.40* 0.41*  LATICACIDVEN  --  1.2  --   --      Estimated Creatinine Clearance: 71.2 mL/min (A) (by C-G formula based on SCr of 0.41 mg/dL (L)).    Allergies  Allergen Reactions   Gabapentin     Tremors    Bactrim [Sulfamethoxazole-Trimethoprim] Itching    Antimicrobials this admission: 5/29 Zosyn x 1  5/29 Vancomycin >>  5/29 Cefepime >>   Dose adjustments this admission: N/A  Microbiology results: 5/29 BCx: NGTD 5/30 OR cultures: pending  Thank you for allowing pharmacy to be a part of this patient's care.  Tressie Ellis 03/23/2023 9:34 AM

## 2023-03-23 NOTE — TOC CM/SW Note (Signed)
CSW went by room for readmission prevention screen. Nurse was entering the room. CSW came back later and patient was on the phone. Will try again later.  Charlynn Court, CSW [phone]

## 2023-03-23 NOTE — Consult Note (Signed)
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 Left stump w/NPWT in place; per vascular PA will be taken back to OR for first NPWT change Wednesday 6/5; WOC team to begin NPWT dressing changes Friday 03/30/2023 2  R stump full thickness wound 1.5 cm x 3 cm x  0.3 cm 50% dry brown exudate 25% pink moist 25% yellow fibrin   Drainage (amount, consistency, odor) minimal serosanguinous  Periwound: scaly dry skin  Dressing procedure/placement/frequency: Clean R stump wound with NS, apply saline moistened (not saturated) gauze to wound bed twice daily making sure to keep off intact skin surrounding wound.  Cover with dry gauze and silicone foam.  May lift foam to change dressing daily.  Change foam dressing q3 days and prn soiling.   Per vascular note 03/16/2023 ok to use saline wet to dry dressings to this area.    WOC team will follow for NPWT L stump M-W-F per vascular team with first dressing change Friday 03/30/2023.   Thank you,     Priscella Mann MSN, RN-BC, [employer] [phone]

## 2023-03-23 NOTE — Discharge Instructions (Addendum)
  Clean right stump daily with NS, apply saline moistened gauze to wound (keep moisented part off intact skin).  Cover with dry gauze and silicone foam.  Change dressing daily and silicone foam every 3 days.     Home Ramps  [facility] For permanent ramp Call [place] office: [phone] Will have to obtain documentation to confirm medical and financial eligibility. Not a quick process. Can take up to a year to complete.  [employer] For temporary ramp Call [phone]

## 2023-03-23 NOTE — TOC Initial Note (Addendum)
Transition of Care [facility]) - Initial/Assessment Note    Patient Details  Name: Carla [name] MRN: [id number] Date of Birth: 26-Aug-1961  Transition of Care [facility]) CM/SW Contact:    Margarito Liner, LCSW Phone Number: 03/23/2023, 12:18 PM  Clinical Narrative:   Readmission prevention screen complete. CSW met with patient. No supports at bedside. CSW introduced role and explained that discharge planning would be discussed. PCP is Beverely Low, MD at [facility]. She uses Medicaid Transportation to get to appointments. She uses the [facility]. No issues obtaining medications. Patient lives home with her daughter and son-in-law. She was active with [facility] for PT, OT, RN prior to admission. Patient does not want them doing her wound care anymore and would prefer to go to the vascular office three times a week for dressing changes. The vascular office is willing to change the dressing once per week and home health will have to change it the other two times. Patient was upset by this but expressed understanding. Patient already has a home wound vac through [employer]. CSW notified the liaison that she was here. If patient needs extra supplies they will send them to the home. Patient has a wheelchair (at the bedside) and a shower chair. She has an Mining engineer wheelchair that is at her other daughter's house. Patient said she has been asking her landlord for a year to build her a ramp. CSW will add ramp resources to AVS. No further concerns. CSW encouraged patient to contact CSW as needed. CSW will continue to follow patient for support and facilitate return home when stable. Patient said she spoke to her Medicaid Transportation driver and they will pick her up at discharge. She needs 1-2 days notice to set it up. Attending physician is aware.             2:38 pm: Put resources for home ramps on AVS.  Expected Discharge Plan: Home w Home Health Services Barriers to Discharge: Continued Medical Work  up   Patient Goals and CMS Choice     Choice offered to / list presented to : NA      Expected Discharge Plan and Services     Post Acute Care Choice: Resumption of Svcs/PTA Provider Living arrangements for the past 2 months: Single Family Home                           HH Arranged: RN, PT, OT [facility] Agency: [facility] Date HH Agency Contacted: 03/22/23   Representative spoke with at [facility] Agency: Feliberto Gottron  Prior Living Arrangements/Services Living arrangements for the past 2 months: Single Family Home Lives with:: Adult Children Patient language and need for interpreter reviewed:: Yes Do you feel safe going back to the place where you live?: Yes      Need for Family Participation in Patient Care: Yes (Comment) Care giver support system in place?: Yes (comment) Current home services: DME, Home OT, Home PT, Home RN Criminal Activity/Legal Involvement Pertinent to Current Situation/Hospitalization: No - Comment as needed  Activities of Daily Living Home Assistive Devices/Equipment: Wheelchair, Nurse, adult ADL Screening (condition at time of admission) Patient's cognitive ability adequate to safely complete daily activities?: Yes Is the patient deaf or have difficulty hearing?: No Does the patient have difficulty seeing, even when wearing glasses/contacts?: No Does the patient have difficulty concentrating, remembering, or making decisions?: No Patient able to express need for assistance with ADLs?: Yes  Does the patient have difficulty dressing or bathing?: Yes Independently performs ADLs?: No Communication: Independent Dressing (OT): Needs assistance Is this a change from baseline?: Pre-admission baseline Grooming: Independent Feeding: Independent Bathing: Needs assistance Is this a change from baseline?: Pre-admission baseline Toileting: Needs assistance Is this a change from baseline?: Pre-admission baseline In/Out Bed: Dependent Is this a  change from baseline?: Pre-admission baseline Walks in Home: Dependent Is this a change from baseline?: Pre-admission baseline Does the patient have difficulty walking or climbing stairs?: Yes Weakness of Legs: Both (amputation) Weakness of Arms/Hands: None  Permission Sought/Granted Permission sought to share information with : Facility Industrial/product designer granted to share information with : Yes, Verbal Permission Granted     Permission granted to share info w AGENCY: [facility]        Emotional Assessment Appearance:: Appears stated age Attitude/Demeanor/Rapport: Engaged, Gracious Affect (typically observed): Accepting, Appropriate, Calm, Pleasant Orientation: : Oriented to Self, Oriented to Place, Oriented to  Time, Oriented to Situation Alcohol / Substance Use: Not Applicable Psych Involvement: No (comment)  Admission diagnosis:  Wound infection [T14.8XXA, L08.9] Cellulitis of left lower extremity [L03.116] Cellulitis of left thigh [L03.116] Patient Active Problem List   Diagnosis Date Noted   Cellulitis of left thigh 03/21/2023   Left thigh pain 03/21/2023   Constipation 02/20/2023   Acute lower limb ischemia 02/15/2023   ST elevation 02/15/2023   Ischemic leg 01/17/2023   Hyponatremia 12/12/2022   Amputation stump infection (HCC) 12/08/2022   Acute osteomyelitis of right thigh (HCC) 12/03/2022   Neuropathy 12/03/2022   Diarrhea 12/03/2022   Leg abscess 08/31/2022   Anxiety 08/31/2022   Chronic systolic CHF (congestive heart failure) (HCC) 08/30/2022   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) 08/29/2022   Pressure injury of skin 08/28/2022   Right leg pain 08/26/2022   Hyperlipidemia, unspecified 08/26/2022   Iron deficiency anemia 08/26/2022   Acute blood loss anemia 08/09/2022   Hematoma 07/26/2022   Hematoma of right thigh 07/24/2022   Acute respiratory failure with hypoxia (HCC) 05/02/2022   Lactic  acidosis 05/02/2022   Hypoalbuminemia due to protein-calorie malnutrition (HCC) 05/02/2022   Atherosclerosis of artery of extremity with rest pain (HCC) 04/06/2022   Rectal bleeding 03/21/2022   Hypokalemia 02/03/2022   Chronic anticoagulation    Anxiety and depression    Palpitations    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HCC) 12/03/2021   Critical limb ischemia of left lower extremity (HCC) 11/30/2021   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   Arterial occlusion 11/11/2021   Coronary artery disease involving native coronary artery of native heart 07/07/2021   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HCC) 06/27/2021   Ischemic pain of right foot 06/27/2021   PVD (peripheral vascular disease) (HCC) 05/03/2021   Tobacco abuse counseling 10/26/2020   Smoker 10/26/2020   Chronic diastolic CHF (congestive heart failure) (HCC) 10/06/2020   Chronic pain 12/01/2019   Primary osteoarthritis involving multiple joints 12/01/2019   Carpal tunnel syndrome, right 11/10/2019   Health care maintenance 03/26/2019   Disorder of skeletal system 03/26/2019   Problems influencing health status 03/26/2019   Chronic low back pain (Secondary area of Pain) (Bilateral) w/o sciatica 03/26/2019   Aortic atherosclerosis (HCC) 09/12/2018   COPD (chronic obstructive pulmonary disease) (HCC) 09/12/2018   Moderate episode of recurrent major depressive disorder (HCC) 09/12/2018   Lumbar spondylosis 09/06/2018   Ventral hernia with obstruction and without gangrene 07/02/2018   Paroxysmal atrial fibrillation (HCC) 06/15/2018   Acute encephalopathy 04/08/2018  DDD (degenerative disc disease), lumbosacral 09/25/2017   Osteoarthritis of knee 09/25/2017   Adjustment disorder with anxiety 08/15/2017   Protein-calorie malnutrition, severe 08/08/2017   Chronic mesenteric ischemia (HCC)    AKI (acute kidney injury) (HCC) 07/29/2017   Hypotension 07/29/2017   Embolus of superior mesenteric artery (HCC)    Abdominal pain     Occlusive mesenteric ischemia (HCC) 06/19/2017   Acute focal ischemia of small intestine (HCC)    Acute right-sided low back pain with right-sided sciatica 06/13/2017   Osteoarthritis of knee (Bilateral) 06/13/2017   Chronic pain syndrome 09/13/2016   Obesity, Class III, BMI 40-49.9 (morbid obesity) (HCC) 07/16/2016   Personal history of other malignant neoplasm of skin 06/01/2016   Chronic knee pain (Primary Area of Pain) (Bilateral) 01/19/2016   Essential hypertension 01/19/2016   GERD (gastroesophageal reflux disease) 01/19/2016   Mixed hyperlipidemia 01/19/2016   Prediabetes 01/19/2016   Recurrent major depressive disorder (HCC) 01/19/2016   Chronic pelvic pain in female 11/14/2013   Mixed stress and urge urinary incontinence 11/14/2013   Chest pain 08/06/2012   PCP:  [facility] Pharmacy:   [facility] [address] ([address]), [place] - [address] [place] ([place]) [place] [address] Phone: [phone] Fax: [phone]  [place] [facility] [address] [place] [address] Phone: [phone] Fax: [phone]  [id number] [place], [place] - [address] AT [facility] OF [address] [place] [address] Phone: [phone] Fax: [phone]     Social Determinants of Health (SDOH) Social History: SDOH Screenings   Food Insecurity: No Food Insecurity (03/22/2023)  Housing: Low Risk  (03/22/2023)  Transportation Needs: No Transportation Needs (03/22/2023)  Utilities: Not At Risk (03/22/2023)  Depression (PHQ2-9): Low Risk  (01/25/2023)  Tobacco Use: Medium Risk (03/22/2023)   SDOH Interventions:     Readmission Risk Interventions    03/23/2023   12:17 PM 12/06/2022    1:31 PM 07/25/2022   10:49 AM  Readmission Risk Prevention Plan  Transportation Screening Complete Complete Complete  Medication Review Oceanographer) Complete  Complete Complete  PCP or Specialist appointment within 3-5 days of discharge Complete  Complete  HRI or Home Care Consult Complete  Complete  SW Recovery Care/Counseling Consult Complete Complete Not Complete  SW Consult Not Complete Comments   NA  Palliative Care Screening Not Applicable Not Applicable   Skilled Nursing Facility Not Applicable  Patient Refused

## 2023-03-23 NOTE — Assessment & Plan Note (Addendum)
Replaced. °

## 2023-03-23 NOTE — Progress Notes (Addendum)
Progress Note   Patient: Carla [name] QIO:[id number] DOB: 1960-12-28 DOA: 03/21/2023     2 DOS: the patient was seen and examined on 03/23/2023   Brief hospital course: [name] is a 62 year old female with history of depression, anxiety, hyperlipidemia, hypertension, tobacco use, paroxysmal atrial fibrillation on Eliquis, obesity, atherosclerosis, CAD, PAD, history of left stump infection post amputation, who presented on 03/21/2023 from vascular outpatient for due to necrosis of the left AKA stump.  Pt was admitted and started on IV antibiotics. Vascular surgery was consulted and took patient to the OR on 5/30 for debridement and wound vac placement.   Plan is to return to OR next Wednesday, 03/28/23.  Assessment and Plan: * Amputation stump infection [facility]) Vascular service consulted. 5/30 - taken to OR for debridement and wound vac placement Plan is return to OR Wed 6/5 for first wound vac change. Continue IV antibiotics - Vanc, Cefepime for now Pain control per orders, adjusted with scheduled Tylenol+Oxy, PRN Oxy, PRN IV for breakthrough Bowel regimen  Hypokalemia K 3.1 this AM Oral Kcl replacement ordered. Monitor BMP, check Mg level  Chronic anticoagulation Eliquis held on admission. Resume when okay from vascular surgery's standpoint.  PVD (peripheral vascular disease) (HCC) Complicated by bilateral AKA's. Mgmt per vascular surgery  Paroxysmal atrial fibrillation (HCC) HR's controlled. Eliquis on hold, resume when okay with vascular. Continue metoprolol  COPD (chronic obstructive pulmonary disease) (HCC) Stable, not exacerbated. Continue bronchodilators PRN  Recurrent major depressive disorder (HCC) Appears no longer on Celexa  Left thigh pain Due to AKA stump infection, with underlying chronic pain syndrome --Pain control per orders  Cellulitis of left thigh See amputation stump infection  Anxiety and depression Appears no longer on  Celexa  Adjustment disorder with anxiety Appears no longer taking Celexa  Chronic pain syndrome Continue home Lyrica. Acute pain control with PRN oxycodone, IV Dilaudid for breakthrough pain  GERD (gastroesophageal reflux disease) Continue PPI  Essential hypertension Continue home Imdur, metoprolol         Subjective: Pt awake sitting up in bed this AM.  Reports pain is very poorly controlled.   No other acute complaints.    Physical Exam: Vitals:   03/23/23 0551 03/23/23 0700 03/23/23 0934 03/23/23 1100  BP: 122/69 109/62 (!) 128/43 (!) 107/55  Pulse: 64 62 69 81  Resp: 14 17  17   Temp:  97.7 F (36.5 C)  98.1 F (36.7 C)  TempSrc:  Oral  Oral  SpO2: 98% 98%  98%  Weight:       General exam: awake, alert, no acute distress, obese HEENT: moist mucus membranes, hearing grossly normal  Respiratory system: CTAB, no wheezes, rales or rhonchi, normal respiratory effort. Cardiovascular system: normal S1/S2, RRR,  Gastrointestinal system: soft, NT, ND Central nervous system: A&O x3. no gross focal neurologic deficits, normal speech Extremities: b/l AKA's with new wound vac on left stump appears well sealed with serosanguinous fluid in tubing Skin: dry, intact, normal temperature Psychiatry: normal mood, congruent affect, judgement and insight appear normal   Data Reviewed:  Notable labs ---  K 3.1, Cl 112, bicarb 20, glucose 152, Cr 0.41, Ca 8.6, Hbg 9.7 from 10.4   Family Communication: None present  Disposition: Status is: Inpatient Remains inpatient appropriate because: ongoing vascular evaluation, remains on IV antibiotics, further OR procedure/s planned   Planned Discharge Destination: Home    Time spent: 42 minutes  Author: Pennie Banter, DO 03/23/2023 1:02 PM  For on call review [email or web address].

## 2023-03-23 NOTE — Progress Notes (Signed)
Progress Note    03/23/2023 1:02 PM 1 Day Post-Op  Subjective: Carla [name] is a 62 year old female who presented to the vein and vascular clinic with an infected left stump status post left above-the-knee amputation.  She was then sent to the emergency department where she was admitted for antibiotics and incision and drainage of infection.  Patient is now postop day 1 from incision and drainage with wound VAC placement to the left stump.  Patient is recovering as expected.  Patient cries consistently and endorses 10 out of 10 pain from the surgical site.  Wound VAC is in place and working properly.   Vitals:   03/23/23 0934 03/23/23 1100  BP: (!) 128/43 (!) 107/55  Pulse: 69 81  Resp:  17  Temp:  98.1 F (36.7 C)  SpO2:  98%   Physical Exam: Cardiac:  HX Atrial fibrillation rate in the 90's Lungs:  Clear on auscultation throughout.  Incisions:  Left stump incision dressed with wound vac. Working well Extremities:  Bilateral lower extremity AKA's. Left with infection and now wound vac  Abdomen:  Positive bowel sounds. Right lower quadrant hernia.  Neurologic: AAOX3 follows commands.   CBC    Component Value Date/Time   WBC 6.5 03/23/2023 0415   RBC 3.57 (L) 03/23/2023 0415   HGB 9.7 (L) 03/23/2023 0415   HGB 13.4 02/12/2015 1137   HCT 31.0 (L) 03/23/2023 0415   HCT 40.9 02/12/2015 1137   PLT 264 03/23/2023 0415   PLT 272 02/12/2015 1137   MCV 86.8 03/23/2023 0415   MCV 90 02/12/2015 1137   MCH 27.2 03/23/2023 0415   MCHC 31.3 03/23/2023 0415   RDW 15.5 03/23/2023 0415   RDW 14.8 (H) 02/12/2015 1137   LYMPHSABS 1.6 02/16/2023 1141   LYMPHSABS 1.7 08/27/2014 1145   MONOABS 0.4 02/16/2023 1141   MONOABS 0.4 08/27/2014 1145   EOSABS 0.3 02/16/2023 1141   EOSABS 0.0 08/27/2014 1145   BASOSABS 0.0 02/16/2023 1141   BASOSABS 0.0 08/27/2014 1145    BMET    Component Value Date/Time   NA 140 03/23/2023 0415   NA 140 02/12/2015 1137   K 3.1 (L) 03/23/2023 0415    K 2.9 (L) 02/12/2015 1137   CL 112 (H) 03/23/2023 0415   CL 107 02/12/2015 1137   CO2 20 (L) 03/23/2023 0415   CO2 27 02/12/2015 1137   GLUCOSE 152 (H) 03/23/2023 0415   GLUCOSE 120 (H) 02/12/2015 1137   BUN 10 03/23/2023 0415   BUN 15 02/12/2015 1137   CREATININE 0.41 (L) 03/23/2023 0415   CREATININE 0.78 02/12/2015 1137   CALCIUM 8.6 (L) 03/23/2023 0415   CALCIUM 9.0 02/12/2015 1137   GFRNONAA >60 03/23/2023 0415   GFRNONAA >60 02/12/2015 1137   GFRAA >60 11/23/2019 1330   GFRAA >60 02/12/2015 1137    INR    Component Value Date/Time   INR 1.6 (H) 03/21/2023 1627   INR 0.8 07/23/2012 0433     Intake/Output Summary (Last 24 hours) at 03/23/2023 1302 Last data filed at 03/23/2023 1610 Gross per 24 hour  Intake 2670.99 ml  Output 700 ml  Net 1970.99 ml     Assessment/Plan:  62 y.o. female is s/p Incision and drainage of left stump infection with wound vac placement  1 Day Post-Op   PLAN: Patient will be taken back to the operating room for first wound VAC change on Wednesday, March 28, 2023.  Will most likely entail wound washout and possibly antibiotic beads  placed.  Patient is aware and agrees to the timeline.  DVT prophylaxis:  Eliquis 5 mg twice daily.    [name] [facility] 03/23/2023 1:02 PM

## 2023-03-24 DIAGNOSIS — T8740 Infection of amputation stump, unspecified extremity: Secondary | ICD-10-CM | POA: Diagnosis not present

## 2023-03-24 LAB — BASIC METABOLIC PANEL
Anion gap: 5 (ref 5–15)
BUN: 17 mg/dL (ref 8–23)
CO2: 23 mmol/L (ref 22–32)
Calcium: 8.4 mg/dL — ABNORMAL LOW (ref 8.9–10.3)
Chloride: 111 mmol/L (ref 98–111)
Creatinine, Ser: 0.49 mg/dL (ref 0.44–1.00)
GFR, Estimated: >60 mL/min (ref >60)
Glucose, Bld: 119 mg/dL — ABNORMAL HIGH (ref 70–99)
Potassium: 4.1 mmol/L (ref 3.5–5.1)
Sodium: 139 mmol/L (ref 135–145)

## 2023-03-24 LAB — CULTURE, BLOOD (ROUTINE X 2)

## 2023-03-24 MED ORDER — ALUM & MAG HYDROXIDE-SIMETH 200-200-20 MG/5ML PO SUSP
30.0000 mL | Freq: Four times a day (QID) | ORAL | Status: DC | PRN
  Administered 2023-03-24 – 2023-03-25 (×2): 30 mL via ORAL
  Filled 2023-03-24 (×2): qty 30

## 2023-03-24 MED ORDER — METOPROLOL TARTRATE 25 MG PO TABS
25.0000 mg | ORAL_TABLET | Freq: Two times a day (BID) | ORAL | Status: DC
  Administered 2023-03-25: 25 mg via ORAL
  Filled 2023-03-24 (×2): qty 1

## 2023-03-24 MED ORDER — MIDODRINE HCL 5 MG PO TABS
5.0000 mg | ORAL_TABLET | Freq: Three times a day (TID) | ORAL | Status: DC
  Administered 2023-03-24 – 2023-03-28 (×13): 5 mg via ORAL
  Filled 2023-03-24 (×12): qty 1

## 2023-03-24 MED ORDER — SODIUM CHLORIDE 0.9 % IV SOLN
1.0000 g | Freq: Three times a day (TID) | INTRAVENOUS | Status: AC
  Administered 2023-03-24 – 2023-04-09 (×49): 1 g via INTRAVENOUS
  Filled 2023-03-24 (×52): qty 20

## 2023-03-24 MED ORDER — NYSTATIN 100000 UNIT/GM EX CREA
TOPICAL_CREAM | Freq: Two times a day (BID) | CUTANEOUS | Status: DC
  Administered 2023-03-24 – 2023-04-02 (×3): 1 via TOPICAL
  Filled 2023-03-24 (×2): qty 30

## 2023-03-24 MED ORDER — OXYCODONE HCL ER 10 MG PO T12A
10.0000 mg | EXTENDED_RELEASE_TABLET | Freq: Two times a day (BID) | ORAL | Status: DC
  Administered 2023-03-24 – 2023-03-25 (×4): 10 mg via ORAL
  Filled 2023-03-24 (×4): qty 1

## 2023-03-24 NOTE — Plan of Care (Signed)

## 2023-03-24 NOTE — Consult Note (Addendum)
Pharmacy Antibiotic Note  Carla [name] is a 62 y.o. female admitted on 03/21/2023 with  wound infection . PMH significant for HTN, GERD, AF, COPD, PVD. Patient was sent to ED by vascular given concern for possible osteomyelitis. Patient has received multiple IV antibiotics this year including meropenem, daptomycin, and ertapenem in February and March. She has also received other oral agents including omadacycline and metronidazole this year. Historical wound cultures have grown resistant bacteria including MRSA and ESBL E. coli. Surgical pathology from 5/1 AKA was negative for OM, but tissue cultures returned on 6/1 growing ESBL E. coli. Pharmacy now consulted to initiate meropenem.  Plan: Discontinue cefepime and vancomycin Initiate meropenem 1 g IV q8H Monitor renal function to assess for any necessary antibiotic dosing changes  Weight: 81.6 kg (180 lb)  Temp (24hrs), Avg:98.3 F (36.8 C), Min:97.6 F (36.4 C), Max:98.7 F (37.1 C)  Recent Labs  Lab 03/21/23 1412 03/21/23 1627 03/22/23 0542 03/23/23 0415 03/24/23 0431  WBC 7.5  --  6.7 6.5  --   CREATININE 0.64  --  0.40* 0.41* 0.49  LATICACIDVEN  --  1.2  --   --   --      Estimated Creatinine Clearance: 71.2 mL/min (by C-G formula based on SCr of 0.49 mg/dL).    Allergies  Allergen Reactions   Gabapentin     Tremors    Bactrim [Sulfamethoxazole-Trimethoprim] Itching    Antimicrobials this admission: 5/29 Zosyn x 1  5/29 Vancomycin >> 6/1 5/29 Cefepime >> 6/1 6/01 Meropenem >>  Dose adjustments this admission: N/A  Microbiology results: 5/29 BCx: NGTD 5/30 OR cultures: ESBL E. coli (S-imipenem, S-gentamicin) + Rare GPCs, GPRs, GNRs  Thank you for allowing pharmacy to be a part of this patient's care.  Will M. Dareen Piano, PharmD PGY-1 Pharmacy Resident 03/24/2023 2:21 PM

## 2023-03-24 NOTE — Progress Notes (Signed)
Progress Note   Patient: Carla [name] XBJ:[id number] DOB: 06-18-1961 DOA: 03/21/2023     3 DOS: the patient was seen and examined on 03/24/2023   Brief hospital course: [name] is a 62 year old female with history of depression, anxiety, hyperlipidemia, hypertension, tobacco use, paroxysmal atrial fibrillation on Eliquis, obesity, atherosclerosis, CAD, PAD, history of left stump infection post amputation, who presented on 03/21/2023 from vascular outpatient for due to necrosis of the left AKA stump.  Pt was admitted and started on IV antibiotics. Vascular surgery was consulted and took patient to the OR on 5/30 for debridement and wound vac placement.   Plan is to return to OR next Wednesday, 03/28/23.  Assessment and Plan: * Amputation stump infection [facility]) Vascular service consulted. 5/30 - taken to OR for debridement and wound vac placement 6/1 - wound cultures growing ESBL E coli --Plan return to OR Wed 6/5 for first wound vac change. --Change IV antibiotics - Vanc, Cefepime >> Merrem --Pain control per orders, adjusted with scheduled Tylenol, scheduled Oxycontin, PRN Oxy, PRN IV for breakthrough --Bowel regimen  Hypotension Likely due to pain meds and infection. Low BP's have led to pain meds needing to be held.   --Reduce metoprolol 50 >> 25 mg BID --Started midodrine 5 mg TID WC for now --Wean off midodrine as BP tolerates, expect that will be when requiring less pain meds  --Maintain MAP>65  Hypokalemia Resolved with replacement on 5/31 Monitor BMP, check Mg level  Chronic anticoagulation Eliquis held on admission -resumed  PVD (peripheral vascular disease) (HCC) Complicated by bilateral AKA's. Mgmt per vascular surgery  Paroxysmal atrial fibrillation (HCC) HR's controlled. Eliquis on hold, resume when okay with vascular. Continue metoprolol  COPD (chronic obstructive pulmonary disease) (HCC) Stable, not exacerbated. Continue bronchodilators  PRN  Recurrent major depressive disorder (HCC) Appears no longer on Celexa  Left thigh pain Due to AKA stump infection, with underlying chronic pain syndrome --Pain control per orders  Cellulitis of left thigh See amputation stump infection  Pressure injury of skin I agree with the wound description as below. --Continue local wound care --Frequent repositioning --Diligent hygiene --Monitor for signs of infection Pressure Injury 03/22/23 Buttocks Left Stage 2 -  Partial thickness loss of dermis presenting as a shallow open injury with a red, pink wound bed without slough. (Active)  03/22/23 1700 (This RN was the 2nd RN at initial skin check on admission on 03/22/23.  There was a small stage II to left buttock at that time.  This RN and admitting RN placed a foam dressing to the coccyx at that time)  Location: Buttocks  Location Orientation: Left  Staging: Stage 2 -  Partial thickness loss of dermis presenting as a shallow open injury with a red, pink wound bed without slough.  Wound Description (Comments):   Present on Admission: Yes      Anxiety and depression Appears no longer on Celexa  Adjustment disorder with anxiety Appears no longer taking Celexa  Chronic pain syndrome Continue home Lyrica. Acute pain control as outlined above  GERD (gastroesophageal reflux disease) Continue PPI  Essential hypertension Continue home Imdur, metoprolol (reduced from 50 >> 25 mg BID due to hypotension)        Subjective: Pt awake sitting up in bed this AM. She becomes tearful as soon as I enter the room, stating pain is uncontrolled and severe.  Reports it was 7 hours she went without pain medication and it is out of control.  Discussed her low  blood pressures, and needing to be cautious with pain meds as they will drop BP even more. She expressed understanding, but pleads for medicine for relief.    Physical Exam: Vitals:   03/23/23 2335 03/24/23 0405 03/24/23 0857 03/24/23  1150  BP: (!) 90/52 (!) 84/55 (!) 110/50 (!) 95/56  Pulse: 70 69 61 64  Resp: 18 18 18 18   Temp: 98.7 F (37.1 C) 98.4 F (36.9 C) 98.6 F (37 C) 97.6 F (36.4 C)  TempSrc:   Oral Oral  SpO2: 97% 97% 98% 98%  Weight:       General exam: awake, alert, tearful, no acute distress but appears in pain, obese HEENT: moist mucus membranes, hearing grossly normal  Respiratory system: CTAB, no wheezes, rales or rhonchi, normal respiratory effort. Cardiovascular system: normal S1/S2, RRR,  Gastrointestinal system: soft, NT, ND Central nervous system: A&O x3. no gross focal neurologic deficits, normal speech Extremities: b/l AKA's with new wound vac on left stump appears well sealed with serosanguinous fluid in tubing Skin: dry, intact, normal temperature Psychiatry: normal mood, congruent affect, judgement and insight appear normal   Data Reviewed:  Notable labs ---  BMP normal except glucose 119, Ca 8.4    Micro   - L AKA stump wound cultures -- ESBL E coli sensitive to gentamicin and imipenem, resistant to ampicillin, cephalosporins, ciprofloxacin, zosyn, bactrim, unasyn.  - Blood cultures 5/29 -- neg to date    Family Communication: None present  Disposition: Status is: Inpatient Remains inpatient appropriate because: ongoing vascular evaluation, remains on IV antibiotics, further OR procedure/s planned   Planned Discharge Destination: Home    Time spent: 38 minutes  Author: Pennie Banter, DO 03/24/2023 1:53 PM  For on call review [email or web address].

## 2023-03-24 NOTE — Plan of Care (Addendum)
Patient consistently been tearful and complaining of the need for pain meds throughout the shift.  2x, this RN has walked into the room and noted patient on phone with family (seeming to be functioning fine.).  However, when patient gets off the phone and I'm still in the room - patient looks at the clock and begins to cry.  Patients pain was acknowledged and treated at morning meds pass, but is now requesting her dilauidid.  Unfortunately, current BP is 95/56.  DR. Informed and ordered midodrine, but asked to allow midodrine a little time, before giving the dilaudid IV.    Meanwhile NT has been in room and noted that patient needs cleaned up, but is refusing till pain meds can be giving.  Patient informed, d/t her BP, midodrine will be giving now, but we will have to wait till 1245 to give dilaudid.    Offered to clean patient up again, but still refused current assistance.  1600 - Patient continues to press her call light about every 30 min and request pain meds.  Patient is oriented x4, but argues and crys when we try to explain "pain meds can only be given every so often; it's to protect you."  Patient has been given ordered meds as appropriate and Dr. Is aware of situation.

## 2023-03-24 NOTE — Assessment & Plan Note (Addendum)
Present on admission.  See full description below. 

## 2023-03-24 NOTE — Assessment & Plan Note (Addendum)
Likely due to pain meds and infection.  Midodrine low-dose prescribed.  On lower dose metoprolol.

## 2023-03-25 DIAGNOSIS — T8740 Infection of amputation stump, unspecified extremity: Secondary | ICD-10-CM | POA: Diagnosis not present

## 2023-03-25 LAB — CULTURE, BLOOD (ROUTINE X 2): Culture: NO GROWTH

## 2023-03-25 LAB — CBC
HCT: 30.9 % — ABNORMAL LOW (ref 36.0–46.0)
Hemoglobin: 9.4 g/dL — ABNORMAL LOW (ref 12.0–15.0)
MCH: 26.9 pg (ref 26.0–34.0)
MCHC: 30.4 g/dL (ref 30.0–36.0)
MCV: 88.5 fL (ref 80.0–100.0)
Platelets: 283 10*3/uL (ref 150–400)
RBC: 3.49 MIL/uL — ABNORMAL LOW (ref 3.87–5.11)
RDW: 15.9 % — ABNORMAL HIGH (ref 11.5–15.5)
WBC: 6.2 10*3/uL (ref 4.0–10.5)
nRBC: 0 % (ref 0.0–0.2)

## 2023-03-25 LAB — AEROBIC/ANAEROBIC CULTURE W GRAM STAIN (SURGICAL/DEEP WOUND)

## 2023-03-25 LAB — CREATININE, SERUM
Creatinine, Ser: 0.53 mg/dL (ref 0.44–1.00)
GFR, Estimated: >60 mL/min (ref >60)

## 2023-03-25 MED ORDER — METOPROLOL TARTRATE 25 MG PO TABS
12.5000 mg | ORAL_TABLET | Freq: Two times a day (BID) | ORAL | Status: DC
  Administered 2023-03-25 – 2023-04-12 (×24): 12.5 mg via ORAL
  Filled 2023-03-25 (×32): qty 1

## 2023-03-25 NOTE — Plan of Care (Signed)

## 2023-03-25 NOTE — Progress Notes (Signed)
Progress Note   Patient: Carla [name]:[id number] DOB: 1960/11/18 DOA: 03/21/2023     4 DOS: the patient was seen and examined on 03/25/2023   Brief hospital course: [name] is a 62 year old female with history of depression, anxiety, hyperlipidemia, hypertension, tobacco use, paroxysmal atrial fibrillation on Eliquis, obesity, atherosclerosis, CAD, PAD, history of left stump infection post amputation, who presented on 03/21/2023 from vascular outpatient for due to necrosis of the left AKA stump.  Pt was admitted and started on IV antibiotics. Vascular surgery was consulted and took patient to the OR on 5/30 for debridement and wound vac placement.   Plan is to return to OR next Wednesday, 03/28/23.  Assessment and Plan: * Amputation stump infection [facility]) Vascular service consulted. 5/30 - taken to OR for debridement and wound vac placement 6/1 - wound cultures growing ESBL E coli --Plan return to OR Wed 6/5 for first wound vac change. --Change IV antibiotics - Vanc, Cefepime >> Merrem --Pain control per orders, adjusted with scheduled Tylenol, scheduled Oxycontin, PRN Oxy, PRN IV for breakthrough --Bowel regimen  Hypotension Likely due to pain meds and infection. Low BP's have led to pain meds needing to be held.   --Reduce metoprolol 50 >> 25 >> 12.5 mg BID --Started midodrine 5 mg TID WC for now --Wean off midodrine as BP tolerates, expect that will be when requiring less pain meds  --Maintain MAP>65  Hypokalemia Resolved with replacement on 5/31 Monitor BMP, check Mg level  Chronic anticoagulation Eliquis held on admission -resumed  PVD (peripheral vascular disease) (HCC) Complicated by bilateral AKA's. Mgmt per vascular surgery  Paroxysmal atrial fibrillation (HCC) HR's controlled. Eliquis on hold, resume when okay with vascular. Continue metoprolol  COPD (chronic obstructive pulmonary disease) (HCC) Stable, not exacerbated. Continue bronchodilators  PRN  Recurrent major depressive disorder (HCC) Appears no longer on Celexa  Left thigh pain Due to AKA stump infection, with underlying chronic pain syndrome --Pain control per orders  Cellulitis of left thigh See amputation stump infection  Pressure injury of skin I agree with the wound description as below. --Continue local wound care --Frequent repositioning --Diligent hygiene --Monitor for signs of infection Pressure Injury 03/22/23 Buttocks Left Stage 2 -  Partial thickness loss of dermis presenting as a shallow open injury with a red, pink wound bed without slough. (Active)  03/22/23 1700 (This RN was the 2nd RN at initial skin check on admission on 03/22/23.  There was a small stage II to left buttock at that time.  This RN and admitting RN placed a foam dressing to the coccyx at that time)  Location: Buttocks  Location Orientation: Left  Staging: Stage 2 -  Partial thickness loss of dermis presenting as a shallow open injury with a red, pink wound bed without slough.  Wound Description (Comments):   Present on Admission: Yes      Anxiety and depression Appears no longer on Celexa  Adjustment disorder with anxiety Appears no longer taking Celexa  Chronic pain syndrome Continue home Lyrica. Acute pain control as outlined above  GERD (gastroesophageal reflux disease) Continue PPI  Essential hypertension Continue home Imdur, metoprolol (reduced from 50 >> 25 mg BID due to hypotension)        Subjective: Pt awake sitting up in bed this AM. She was on the phone with a family member.  She reports pain is better controlled today.  Gets tearful again discussing that yesterday, her nurse would not give her meds when she asked for  them, pain was uncontrolled.  We discussed her low BP's and needing be be extremely cautious not to harm her with meds that would further drop her BP.  She became more tearful, but does agree today is better.  No other acute complaints.    Physical Exam: Vitals:   03/25/23 0910 03/25/23 1214 03/25/23 1217 03/25/23 1244  BP: 103/73  (!) 92/56   Pulse: 83  77   Resp: 20 20 20 18   Temp: 98.2 F (36.8 C)  98.2 F (36.8 C)   TempSrc:      SpO2: 98%  94%   Weight:       General exam: awake, alert, tearful, no acute distress but appears in pain, obese HEENT: moist mucus membranes, hearing grossly normal  Respiratory system: CTAB, no wheezes, rales or rhonchi, normal respiratory effort. Cardiovascular system: normal S1/S2, RRR,  Gastrointestinal system: soft, NT, ND Central nervous system: A&O x3. no gross focal neurologic deficits, normal speech Extremities: b/l AKA's with new wound vac on left stump appears well sealed with serosanguinous fluid in tubing Skin: dry, intact, normal temperature Psychiatry: normal mood, congruent affect, judgement and insight appear normal   Data Reviewed:  Notable labs ---  Hbg 9.4   Micro   - L AKA stump wound cultures -- ESBL E coli sensitive to gentamicin and imipenem  - Blood cultures 5/29 -- neg to date    Family Communication: None present  Disposition: Status is: Inpatient Remains inpatient appropriate because: ongoing vascular evaluation, remains on IV antibiotics, further OR procedure/s planned   Planned Discharge Destination: Home    Time spent: 35 minutes  Author: Pennie Banter, DO 03/25/2023 2:30 PM  For on call review [email or web address].

## 2023-03-26 DIAGNOSIS — T8740 Infection of amputation stump, unspecified extremity: Secondary | ICD-10-CM | POA: Diagnosis not present

## 2023-03-26 LAB — CULTURE, BLOOD (ROUTINE X 2)

## 2023-03-26 LAB — AEROBIC/ANAEROBIC CULTURE W GRAM STAIN (SURGICAL/DEEP WOUND)

## 2023-03-26 MED ORDER — OXYCODONE HCL ER 15 MG PO T12A
15.0000 mg | EXTENDED_RELEASE_TABLET | Freq: Two times a day (BID) | ORAL | Status: DC
  Administered 2023-03-26 (×2): 15 mg via ORAL
  Filled 2023-03-26 (×2): qty 1

## 2023-03-26 MED ORDER — OXYCODONE HCL 5 MG PO TABS
5.0000 mg | ORAL_TABLET | Freq: Four times a day (QID) | ORAL | Status: DC | PRN
  Administered 2023-03-26 – 2023-04-04 (×28): 10 mg via ORAL
  Administered 2023-04-04: 5 mg via ORAL
  Administered 2023-04-06 – 2023-04-08 (×5): 10 mg via ORAL
  Administered 2023-04-08 – 2023-04-09 (×2): 5 mg via ORAL
  Administered 2023-04-09 – 2023-04-11 (×5): 10 mg via ORAL
  Filled 2023-03-26 (×22): qty 2
  Filled 2023-03-26: qty 1
  Filled 2023-03-26 (×17): qty 2
  Filled 2023-03-26: qty 1

## 2023-03-26 NOTE — Assessment & Plan Note (Addendum)
Last hemoglobin 8.7.  Last ferritin 14.  Patient on oral iron.

## 2023-03-26 NOTE — Progress Notes (Addendum)
Progress Note   Patient: Carla [name] ZOX:[id number] DOB: 11/28/1960 DOA: 03/21/2023     5 DOS: the patient was seen and examined on 03/26/2023   Brief hospital course: [name] is a 62 year old female with history of depression, anxiety, hyperlipidemia, hypertension, tobacco use, paroxysmal atrial fibrillation on Eliquis, obesity, atherosclerosis, CAD, PAD, history of left stump infection post amputation, who presented on 03/21/2023 from vascular outpatient for due to necrosis of the left AKA stump.  Pt was admitted and started on IV antibiotics. Vascular surgery was consulted and took patient to the OR on 5/30 for debridement and wound vac placement.   Plan is to return to OR next Wednesday, 03/28/23.  Assessment and Plan: * Amputation stump infection [facility]) Vascular service consulted. 5/30 - taken to OR for debridement and wound vac placement 6/1 - wound cultures growing ESBL E coli --Plan return to OR Wed 6/5 for first wound vac change. --Change IV antibiotics - Vanc, Cefepime >> Merrem --Pain control per orders, adjusted with scheduled Tylenol, scheduled Oxycontin, PRN Oxy, PRN IV for breakthrough --Bowel regimen  Hypotension Likely due to pain meds and infection. Low BP's have led to pain meds needing to be held.   --Reduce metoprolol 50 >> 25 >> 12.5 mg BID --Started midodrine 5 mg TID WC for now --Wean off midodrine as BP tolerates, expect that will be when requiring less pain meds  --Maintain MAP>65  Hypokalemia Resolved with replacement on 5/31 Monitor BMP, check Mg level  Chronic anticoagulation Eliquis held on admission -resumed  Acute blood loss anemia Hbg on admission was 12.2, trended down post-operatively to 10.4 >> 9.7 >> 9.4. No signs of active bleeding, some serosanginous drainage in wound vac canister. --Check anemia panel with AM labs --Will replace any deficient iron, B12 or folate --Monitor CBC  PVD (peripheral vascular disease) (HCC) Complicated  by bilateral AKA's. Mgmt per vascular surgery  Paroxysmal atrial fibrillation (HCC) HR's controlled. Eliquis on hold, resume when okay with vascular. Continue metoprolol  COPD (chronic obstructive pulmonary disease) (HCC) Stable, not exacerbated. Continue bronchodilators PRN  Recurrent major depressive disorder (HCC) Appears no longer on Celexa  Left thigh pain Due to AKA stump infection, with underlying chronic pain syndrome --Pain control per orders  Cellulitis of left thigh See amputation stump infection  Pressure injury of skin I agree with the wound description as below. --Continue local wound care --Frequent repositioning --Diligent hygiene --Monitor for signs of infection Pressure Injury 03/22/23 Buttocks Left Stage 2 -  Partial thickness loss of dermis presenting as a shallow open injury with a red, pink wound bed without slough. (Active)  03/22/23 1700 (This RN was the 2nd RN at initial skin check on admission on 03/22/23.  There was a small stage II to left buttock at that time.  This RN and admitting RN placed a foam dressing to the coccyx at that time)  Location: Buttocks  Location Orientation: Left  Staging: Stage 2 -  Partial thickness loss of dermis presenting as a shallow open injury with a red, pink wound bed without slough.  Wound Description (Comments):   Present on Admission: Yes      Anxiety and depression Appears no longer on Celexa  Adjustment disorder with anxiety Appears no longer taking Celexa  Chronic pain syndrome Continue home Lyrica. Acute pain control as outlined above  GERD (gastroesophageal reflux disease) Continue PPI  Essential hypertension Continue home Imdur, metoprolol (reduced from 50 >> 25 mg BID due to hypotension)  Subjective: Pt awake sitting up in bed this AM. She becomes tearful again, stating she is constantly in severe pain.  States her prior amputation site was never this painful and wonders why this time  she has so much pain.  No other acute complaints.  Discussed adjusting pain meds to help.     Physical Exam: Vitals:   03/25/23 2225 03/25/23 2359 03/26/23 0433 03/26/23 1220  BP: 106/67 112/65 (!) 108/59 (!) 101/58  Pulse:  76 67 81  Resp:  18 18 18   Temp:  98.3 F (36.8 C) 98.2 F (36.8 C) 97.8 F (36.6 C)  TempSrc:      SpO2:  96% 98% 96%  Weight:       General exam: awake, alert, tearful, no acute distress, obese HEENT: moist mucus membranes, hearing grossly normal  Respiratory system: on room air, normal respiratory effort. Cardiovascular system: normal S1/S2, RRR,  Central nervous system: A&O x3. no gross focal neurologic deficits, normal speech Extremities: b/l AKA's with new wound vac on left stump appears well sealed with serosanguinous fluid in tubing Skin: dry, intact, normal temperature Psychiatry: normal mood, congruent affect, judgement and insight appear normal   Data Reviewed: No new labs today   Micro   - L AKA stump wound cultures -- ESBL E coli sensitive to gentamicin and imipenem  - Blood cultures 5/29 -- neg to date    Family Communication: None present  Disposition: Status is: Inpatient Remains inpatient appropriate because: ongoing vascular evaluation, remains on IV antibiotics, further OR procedure/s planned   Planned Discharge Destination: Home    Time spent: 35 minutes  Author: Pennie Banter, DO 03/26/2023 2:32 PM  For on call review [email or web address].

## 2023-03-27 DIAGNOSIS — T8743 Infection of amputation stump, right lower extremity: Secondary | ICD-10-CM

## 2023-03-27 DIAGNOSIS — F17210 Nicotine dependence, cigarettes, uncomplicated: Secondary | ICD-10-CM

## 2023-03-27 DIAGNOSIS — B9620 Unspecified Escherichia coli [E. coli] as the cause of diseases classified elsewhere: Secondary | ICD-10-CM

## 2023-03-27 DIAGNOSIS — T8740 Infection of amputation stump, unspecified extremity: Secondary | ICD-10-CM | POA: Diagnosis not present

## 2023-03-27 DIAGNOSIS — I2510 Atherosclerotic heart disease of native coronary artery without angina pectoris: Secondary | ICD-10-CM

## 2023-03-27 DIAGNOSIS — Z8619 Personal history of other infectious and parasitic diseases: Secondary | ICD-10-CM | POA: Diagnosis not present

## 2023-03-27 DIAGNOSIS — T8744 Infection of amputation stump, left lower extremity: Secondary | ICD-10-CM | POA: Diagnosis not present

## 2023-03-27 DIAGNOSIS — I739 Peripheral vascular disease, unspecified: Secondary | ICD-10-CM

## 2023-03-27 LAB — IRON AND TIBC
Iron: 20 ug/dL — ABNORMAL LOW (ref 28–170)
Saturation Ratios: 5 % — ABNORMAL LOW (ref 10.4–31.8)
TIBC: 392 ug/dL (ref 250–450)
UIBC: 372 ug/dL

## 2023-03-27 LAB — BASIC METABOLIC PANEL
Anion gap: 10 (ref 5–15)
BUN: 14 mg/dL (ref 8–23)
CO2: 25 mmol/L (ref 22–32)
Calcium: 8.6 mg/dL — ABNORMAL LOW (ref 8.9–10.3)
Chloride: 105 mmol/L (ref 98–111)
Creatinine, Ser: 0.44 mg/dL (ref 0.44–1.00)
GFR, Estimated: >60 mL/min (ref >60)
Glucose, Bld: 122 mg/dL — ABNORMAL HIGH (ref 70–99)
Potassium: 4.3 mmol/L (ref 3.5–5.1)
Sodium: 140 mmol/L (ref 135–145)

## 2023-03-27 LAB — RETICULOCYTES
Immature Retic Fract: 23.3 % — ABNORMAL HIGH (ref 2.3–15.9)
RBC.: 3.3 MIL/uL — ABNORMAL LOW (ref 3.87–5.11)
Retic Count, Absolute: 65.7 10*3/uL (ref 19.0–186.0)
Retic Ct Pct: 2 % (ref 0.4–3.1)

## 2023-03-27 LAB — CBC
HCT: 29.5 % — ABNORMAL LOW (ref 36.0–46.0)
Hemoglobin: 8.8 g/dL — ABNORMAL LOW (ref 12.0–15.0)
MCH: 26.3 pg (ref 26.0–34.0)
MCHC: 29.8 g/dL — ABNORMAL LOW (ref 30.0–36.0)
MCV: 88.3 fL (ref 80.0–100.0)
Platelets: 260 10*3/uL (ref 150–400)
RBC: 3.34 MIL/uL — ABNORMAL LOW (ref 3.87–5.11)
RDW: 16.4 % — ABNORMAL HIGH (ref 11.5–15.5)
WBC: 8.2 10*3/uL (ref 4.0–10.5)
nRBC: 0 % (ref 0.0–0.2)

## 2023-03-27 LAB — FERRITIN: Ferritin: 14 ng/mL (ref 11–307)

## 2023-03-27 LAB — VITAMIN B12: Vitamin B-12: 234 pg/mL (ref 180–914)

## 2023-03-27 LAB — FOLATE: Folate: 8.4 ng/mL (ref >5.9)

## 2023-03-27 LAB — AEROBIC/ANAEROBIC CULTURE W GRAM STAIN (SURGICAL/DEEP WOUND)

## 2023-03-27 MED ORDER — OXYCODONE HCL ER 10 MG PO T12A
20.0000 mg | EXTENDED_RELEASE_TABLET | Freq: Two times a day (BID) | ORAL | Status: DC
  Administered 2023-03-27 – 2023-03-30 (×7): 20 mg via ORAL
  Filled 2023-03-27 (×7): qty 2

## 2023-03-27 MED ORDER — FERROUS SULFATE 325 (65 FE) MG PO TABS
325.0000 mg | ORAL_TABLET | Freq: Every day | ORAL | Status: DC
  Administered 2023-03-28 – 2023-04-17 (×20): 325 mg via ORAL
  Filled 2023-03-27 (×21): qty 1

## 2023-03-27 NOTE — TOC Initial Note (Signed)
Transition of Care [facility]) - Initial/Assessment Note    Patient Details  Name: Carla [name] MRN: [id number] Date of Birth: [birth year]  Transition [facility] Care [facility]) CM/SW Contact:    Margarito Liner, LCSW Phone Number: 03/27/2023, 10:20 AM  Clinical Narrative:  TOC continues to follow for return home with home health.                Expected Discharge Plan: Home w Home Health Services Barriers to Discharge: Continued Medical Work up   Patient Goals and CMS Choice     Choice offered to / list presented to : NA      Expected Discharge Plan and Services     Post Acute Care Choice: Resumption of Svcs/PTA Provider Living arrangements for the past 2 months: Single Family Home                           HH Arranged: RN, PT, OT [facility] Agency: [facility] Date HH Agency Contacted: 03/22/23   Representative spoke with at [facility] Agency: Feliberto Gottron  Prior Living Arrangements/Services Living arrangements for the past 2 months: Single Family Home Lives with:: Adult Children Patient language and need for interpreter reviewed:: Yes Do you feel safe going back to the place where you live?: Yes      Need for Family Participation in Patient Care: Yes (Comment) Care giver support system in place?: Yes (comment) Current home services: DME, Home OT, Home PT, Home RN Criminal Activity/Legal Involvement Pertinent to Current Situation/Hospitalization: No - Comment as needed  Activities of Daily Living Home Assistive Devices/Equipment: Wheelchair, Nurse, adult ADL Screening (condition at time of admission) Patient's cognitive ability adequate to safely complete daily activities?: Yes Is the patient deaf or have difficulty hearing?: No Does the patient have difficulty seeing, even when wearing glasses/contacts?: No Does the patient have difficulty concentrating, remembering, or making decisions?: No Patient able to express need for assistance with ADLs?: Yes Does the patient have  difficulty dressing or bathing?: Yes Independently performs ADLs?: No Communication: Independent Dressing (OT): Needs assistance Is this a change from baseline?: Pre-admission baseline Grooming: Independent Feeding: Independent Bathing: Needs assistance Is this a change from baseline?: Pre-admission baseline Toileting: Needs assistance Is this a change from baseline?: Pre-admission baseline In/Out Bed: Dependent Is this a change from baseline?: Pre-admission baseline Walks in Home: Dependent Is this a change from baseline?: Pre-admission baseline Does the patient have difficulty walking or climbing stairs?: Yes Weakness of Legs: Both (amputation) Weakness of Arms/Hands: None  Permission Sought/Granted Permission sought to share information with : Facility Industrial/product designer granted to share information with : Yes, Verbal Permission Granted     Permission granted to share info w AGENCY: [facility]        Emotional Assessment Appearance:: Appears stated age Attitude/Demeanor/Rapport: Engaged, Gracious Affect (typically observed): Accepting, Appropriate, Calm, Pleasant Orientation: : Oriented to Self, Oriented to Place, Oriented to  Time, Oriented to Situation Alcohol / Substance Use: Not Applicable Psych Involvement: No (comment)  Admission diagnosis:  Wound infection [T14.8XXA, L08.9] Cellulitis of left lower extremity [L03.116] Cellulitis of left thigh [L03.116] Patient Active Problem List   Diagnosis Date Noted   Cellulitis of left thigh 03/21/2023   Left thigh pain 03/21/2023   Constipation 02/20/2023   Acute lower limb ischemia 02/15/2023   ST elevation 02/15/2023   Ischemic leg 01/17/2023   Hyponatremia 12/12/2022   Amputation stump infection (HCC) 12/08/2022   Acute  osteomyelitis of right thigh (HCC) 12/03/2022   Neuropathy 12/03/2022   Diarrhea 12/03/2022   Leg abscess 08/31/2022   Anxiety 08/31/2022   Chronic systolic CHF  (congestive heart failure) (HCC) 08/30/2022   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) 08/29/2022   Pressure injury of skin 08/28/2022   Right leg pain 08/26/2022   Hyperlipidemia, unspecified 08/26/2022   Iron deficiency anemia 08/26/2022   Acute blood loss anemia 08/09/2022   Hematoma 07/26/2022   Hematoma of right thigh 07/24/2022   Acute respiratory failure with hypoxia (HCC) 05/02/2022   Lactic acidosis 05/02/2022   Hypoalbuminemia due to protein-calorie malnutrition (HCC) 05/02/2022   Atherosclerosis of artery of extremity with rest pain (HCC) 04/06/2022   Rectal bleeding 03/21/2022   Hypokalemia 02/03/2022   Chronic anticoagulation    Anxiety and depression    Palpitations    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HCC) 12/03/2021   Critical limb ischemia of left lower extremity (HCC) 11/30/2021   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   Arterial occlusion 11/11/2021   Coronary artery disease involving native coronary artery of native heart 07/07/2021   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HCC) 06/27/2021   Ischemic pain of right foot 06/27/2021   PVD (peripheral vascular disease) (HCC) 05/03/2021   Tobacco abuse counseling 10/26/2020   Smoker 10/26/2020   Chronic diastolic CHF (congestive heart failure) (HCC) 10/06/2020   Chronic pain 12/01/2019   Primary osteoarthritis involving multiple joints 12/01/2019   Carpal tunnel syndrome, right 11/10/2019   Health care maintenance 03/26/2019   Disorder of skeletal system 03/26/2019   Problems influencing health status 03/26/2019   Chronic low back pain (Secondary area of Pain) (Bilateral) w/o sciatica 03/26/2019   Aortic atherosclerosis (HCC) 09/12/2018   COPD (chronic obstructive pulmonary disease) (HCC) 09/12/2018   Moderate episode of recurrent major depressive disorder (HCC) 09/12/2018   Lumbar spondylosis 09/06/2018   Ventral hernia with obstruction and without gangrene 07/02/2018   Paroxysmal atrial  fibrillation (HCC) 06/15/2018   Acute encephalopathy 04/08/2018   DDD (degenerative disc disease), lumbosacral 09/25/2017   Osteoarthritis of knee 09/25/2017   Adjustment disorder with anxiety 08/15/2017   Protein-calorie malnutrition, severe 08/08/2017   Chronic mesenteric ischemia (HCC)    AKI (acute kidney injury) (HCC) 07/29/2017   Hypotension 07/29/2017   Embolus of superior mesenteric artery (HCC)    Abdominal pain    Occlusive mesenteric ischemia (HCC) 06/19/2017   Acute focal ischemia of small intestine (HCC)    Acute right-sided low back pain with right-sided sciatica 06/13/2017   Osteoarthritis of knee (Bilateral) 06/13/2017   Chronic pain syndrome 09/13/2016   Obesity, Class III, BMI 40-49.9 (morbid obesity) (HCC) 07/16/2016   Personal history of other malignant neoplasm of skin 06/01/2016   Chronic knee pain (Primary Area of Pain) (Bilateral) 01/19/2016   Essential hypertension 01/19/2016   GERD (gastroesophageal reflux disease) 01/19/2016   Mixed hyperlipidemia 01/19/2016   Prediabetes 01/19/2016   Recurrent major depressive disorder (HCC) 01/19/2016   Chronic pelvic pain in female 11/14/2013   Mixed stress and urge urinary incontinence 11/14/2013   Chest pain 08/06/2012   PCP:  [facility]:   [facility] [address]), [place] - [address] [place] ([place]) [place] [address] Phone: [phone] Fax: [phone]  [place] - [facility] [address] [place] [address] Phone: [phone] Fax: [phone]  [id number] - [place],  - [address] [place] [address] Phone: [phone] Fax: [phone]     Social Determinants of Health (SDOH) Social History: SDOH Screenings   Food Insecurity: No Food Insecurity (03/22/2023)  Housing: Low Risk  (03/22/2023)  Transportation  Needs: No Transportation Needs (03/22/2023)  Utilities: Not At Risk (03/22/2023)  Depression (PHQ2-9): Low Risk  (01/25/2023)  Tobacco Use: Medium Risk (03/22/2023)   SDOH Interventions:     Readmission Risk Interventions    03/23/2023   12:17 PM 12/06/2022    1:31 PM 07/25/2022   10:49 AM  Readmission Risk Prevention Plan  Transportation Screening Complete Complete Complete  Medication Review Oceanographer) Complete Complete Complete  PCP or Specialist appointment within 3-5 days of discharge Complete  Complete  HRI or Home Care Consult Complete  Complete  SW Recovery Care/Counseling Consult Complete Complete Not Complete  SW Consult Not Complete Comments   NA  Palliative Care Screening Not Applicable Not Applicable   Skilled Nursing Facility Not Applicable  Patient Refused

## 2023-03-27 NOTE — H&P (View-Only) (Signed)
Progress Note    03/27/2023 9:59 AM 5 Days Post-Op  Subjective:  Carla [name] is a 62-year-old female who presented to the vein and vascular clinic with an infected left stump status post left above-the-knee amputation.  She was then sent to the emergency department where she was admitted for antibiotics and incision and drainage of infection.   Patient is now postop day 5 from incision and drainage with wound VAC placement to the left stump.  Wound VAC started alarming last night.  Upon examination this morning wound VAC sponges clogged.  Patient is recovering as expected.  Patient cries consistently and endorses 10 out of 10 pain from the surgical site.  Wound VAC is turned off for today.  Will be replaced in the OR tomorrow.   Vitals:   03/27/23 0305 03/27/23 0808  BP: 122/68 108/74  Pulse: 87 79  Resp: 18 18  Temp: 98.2 F (36.8 C) 97.7 F (36.5 C)  SpO2: 96% 98%   Physical Exam: Cardiac:  HX Atrial fibrillation rate in the 90's Lungs:  Clear on auscultation throughout.  Incisions:  Left stump incision dressed with wound vac. Working well Extremities:  Bilateral lower extremity AKA's. Left with infection and now wound vac  Abdomen:  Positive bowel sounds. Right lower quadrant hernia.  Neurologic: AAOX3 follows commands.   CBC    Component Value Date/Time   WBC 8.2 03/27/2023 0422   RBC 3.34 (L) 03/27/2023 0422   RBC 3.30 (L) 03/27/2023 0422   HGB 8.8 (L) 03/27/2023 0422   HGB 13.4 02/12/2015 1137   HCT 29.5 (L) 03/27/2023 0422   HCT 40.9 02/12/2015 1137   PLT 260 03/27/2023 0422   PLT 272 02/12/2015 1137   MCV 88.3 03/27/2023 0422   MCV 90 02/12/2015 1137   MCH 26.3 03/27/2023 0422   MCHC 29.8 (L) 03/27/2023 0422   RDW 16.4 (H) 03/27/2023 0422   RDW 14.8 (H) 02/12/2015 1137   LYMPHSABS 1.6 02/16/2023 1141   LYMPHSABS 1.7 08/27/2014 1145   MONOABS 0.4 02/16/2023 1141   MONOABS 0.4 08/27/2014 1145   EOSABS 0.3 02/16/2023 1141   EOSABS 0.0 08/27/2014 1145    BASOSABS 0.0 02/16/2023 1141   BASOSABS 0.0 08/27/2014 1145    BMET    Component Value Date/Time   NA 140 03/27/2023 0422   NA 140 02/12/2015 1137   K 4.3 03/27/2023 0422   K 2.9 (L) 02/12/2015 1137   CL 105 03/27/2023 0422   CL 107 02/12/2015 1137   CO2 25 03/27/2023 0422   CO2 27 02/12/2015 1137   GLUCOSE 122 (H) 03/27/2023 0422   GLUCOSE 120 (H) 02/12/2015 1137   BUN 14 03/27/2023 0422   BUN 15 02/12/2015 1137   CREATININE 0.44 03/27/2023 0422   CREATININE 0.78 02/12/2015 1137   CALCIUM 8.6 (L) 03/27/2023 0422   CALCIUM 9.0 02/12/2015 1137   GFRNONAA >60 03/27/2023 0422   GFRNONAA >60 02/12/2015 1137   GFRAA >60 11/23/2019 1330   GFRAA >60 02/12/2015 1137    INR    Component Value Date/Time   INR 1.6 (H) 03/21/2023 1627   INR 0.8 07/23/2012 0433     Intake/Output Summary (Last 24 hours) at 03/27/2023 0959 Last data filed at 03/27/2023 0647 Gross per 24 hour  Intake 1045 ml  Output 1700 ml  Net -655 ml     Assessment/Plan:  62 y.o. female is s/p Left Lower extremity AKA wound washout with Vac application 5 Days Post-Op   PLAN: Patient will   be taken back to the operating room for first wound VAC change on Wednesday, March 28, 2023.  Will most likely entail wound washout and possibly antibiotic beads placed.  Patient is aware and agrees to the timeline.   DVT prophylaxis:  Eliquis 5 mg twice daily    [name] 03/27/2023 9:59 AM

## 2023-03-27 NOTE — Consult Note (Signed)
NAME: Carla [name]  DOB: 1960-10-31  MRN: [id number]  Date/Time: 03/27/2023 11:22 AM  REQUESTING PROVIDER: Dr.Griffith Subjective:  REASON FOR CONSULT: AKA stump infection ? Carla [name] is a 62 y.o. 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 She was asked to get admitted  Vitals in the ED   03/21/23 16:10  BP 140/68 !  Pulse Rate 79  SpO2 100 %   Labs  Latest Reference Range & Units 03/21/23 14:12  WBC 4.0 - 10.5 K/uL 7.5  RBC 3.87 - 5.11 MIL/uL 4.77  Hemoglobin 12.0 - 15.0 g/dL 40.9  HCT 81.1 - 91.4 % 40.9  MCV 80.0 - 100.0 fL 85.7  MCH 26.0 - 34.0 pg 26.8  MCHC 30.0 - 36.0 g/dL 78.2  RDW 95.6 - 21.3 % 15.7 (H)  Platelets 150 - 400 K/uL 421 (H)  nRBC 0.0 - 0.2 % 0.0  Cr 0.64 Glucose 121 Was started on vanco/cefepime Pt underwent I/D of the Left AKA stump site and skin, soft tissue and muscle were debrided and wound vac placed on 03/22/23 I am asked to see patient as culture from the infected stump is positive for ESBL ecol and Proteus   Past Medical History:  Diagnosis Date   Abdominal aortic atherosclerosis (HCC)    a. 05/2017 CTA abd/pelvis: significant atherosclerotic dzs of the infrarenal abd Ao w/ some mural thrombus. No aneurysm or dissection.   Acute focal ischemia of small intestine (HCC)    Acute right-sided low back pain with right-sided sciatica 06/13/2017   AKI (acute kidney injury) (HCC) 07/29/2017   Anemia    Anginal pain (HCC)    a. 08/2012 Lexiscan MV: EF 54%, non ischemia/infarct.    Anxiety and depression    Baker's cyst of knee, right    a. 07/2016 U/S: 4.1 x 1.4 x 2.9 cystic structure in R poplitetal fossa.   Bell's palsy    CAD (coronary artery disease)    CHF (congestive heart failure) (HCC)    a.) TTE 06/20/2017: EF 60-65%, no rwma, G1DD, no source of cardiac emboli.   Chronic anticoagulation    a.) apixaban   Chronic idiopathic constipation    Chronic mesenteric ischemia (HCC)    Chronic pain    COPD (chronic obstructive pulmonary disease) (HCC)    Dyspnea    Embolus of superior mesenteric artery (HCC)    a. 05/2017 CTA Abd/pelvis: apparent thrombus or embolus in prox SMA (70-90%); b. 05/2017 catheter directed tPA, mechanical thrombectomy, and stenting of the SMA.   Essential hypertension with goal blood pressure less than 140/90 01/19/2016   Gastroesophageal reflux disease 01/19/2016   GERD (gastroesophageal reflux disease)    H/O colectomy    History of 2019 novel coronavirus disease (COVID-19) 11/14/2019   History of kidney stones    Hyperlipidemia    Hypertension    Morbid obesity with BMI of 40.0-44.9, adult (HCC)    NSTEMI (non-ST elevated myocardial infarction) (HCC) 06/27/2021   a.) high sensitivity troponins trended: 893 --> 1587 --> 1748 --> 868 --> 735 ng/L  Occlusive mesenteric ischemia (HCC) 06/19/2017   Osteoarthritis    PAD (peripheral artery disease) (HCC)    PAF (paroxysmal atrial fibrillation) (HCC)    a.) CHA2DS2-VASc = 6 (sex, CHF, HTN, DVT x2, aortic plaque). b.) rate/rhythm maintained on oral metoprolol tartrate; chronically anticoagulated with standard dose apixaban.   Palpitations    Peripheral edema    Personal history of other malignant neoplasm of skin 06/01/2016   Pneumonia    Pre-diabetes    Primary osteoarthritis of both knees 06/13/2017   SBO (small bowel obstruction) (HCC) 08/06/2017   Skin cancer of nose    Superior mesenteric artery thrombosis (HCC)    Vitamin D deficiency     Past Surgical History:  Procedure  Laterality Date   AMPUTATION Right 04/06/2022   Procedure: AMPUTATION ABOVE KNEE;  Surgeon: Annice Needy, MD;  Location: [facility];  Service: Vascular;  Laterality: Right;   AMPUTATION  12/07/2022   Procedure: AMPUTATION ABOVE KNEE REVISION WITH RESECTION OF FEMUR;  Surgeon: Annice Needy, MD;  Location: [facility];  Service: Vascular;;   AMPUTATION Left 02/21/2023   Procedure: AMPUTATION ABOVE KNEE;  Surgeon: Annice Needy, MD;  Location: [facility];  Service: General;  Laterality: Left;   AMPUTATION Left 03/22/2023   Procedure: AMPUTATION ABOVE KNEE STUMP DEBIDEMENT;  Surgeon: Annice Needy, MD;  Location: [facility];  Service: Vascular;  Laterality: Left;   AMPUTATION TOE Left 12/09/2019   Procedure: AMPUTATION TOE MPJ left;  Surgeon: Rosetta Posner, DPM;  Location: [facility];  Service: Podiatry;  Laterality: Left;   APPENDECTOMY     APPLICATION OF WOUND VAC  12/01/2021   Procedure: APPLICATION OF WOUND VAC;  Surgeon: Annice Needy, MD;  Location: [facility];  Service: Vascular;;   APPLICATION OF WOUND VAC Right 05/03/2022   Procedure: APPLICATION OF WOUND VAC;  Surgeon: Annice Needy, MD;  Location: [facility];  Service: Vascular;  Laterality: Right;   APPLICATION OF WOUND VAC Left 07/23/2022   Procedure: APPLICATION OF WOUND VAC;  Surgeon: Nada Libman, MD;  Location: [facility];  Service: Vascular;  Laterality: Left;  Prevena   APPLICATION OF WOUND VAC Right 08/28/2022   Procedure: APPLICATION OF WOUND VAC;  Surgeon: Annice Needy, MD;  Location: [facility];  Service: Vascular;  Laterality: Right;   APPLICATION OF WOUND VAC  12/07/2022   Procedure: APPLICATION OF WOUND VAC;  Surgeon: Annice Needy, MD;  Location: [facility];  Service: Vascular;;   AXILLARY-FEMORAL BYPASS GRAFT Left 07/23/2022   Procedure: BYPASS GRAFT AXILLA-BIFEMORAL;  Surgeon: Nada Libman, MD;  Location: [facility];  Service: Vascular;  Laterality: Left;   CHOLECYSTECTOMY     COLON SURGERY     COLONOSCOPY WITH PROPOFOL N/A 03/23/2022   Procedure:  COLONOSCOPY WITH PROPOFOL;  Surgeon: Wyline Mood, MD;  Location: [facility];  Service: Gastroenterology;  Laterality: N/A;  rectal bleed   CORONARY/GRAFT ACUTE MI REVASCULARIZATION N/A 02/15/2023   Procedure: Coronary/Graft Acute MI Revascularization;  Surgeon: Marykay Lex, MD;  Location: [facility];  Service: Cardiovascular;  Laterality: N/A;   EMBOLECTOMY OR THROMBECTOMY, WITH OR WITHOUT CATHETER; FEMOROPOPLITEAL, AORTOILIAC ARTERY, BY LEG INCISION N/A 06/21/2020   Left - Decomp Fasciotomy Leg; Ant &/Or Lat Compart Only; Midline - Angiography, Visceral, Selective Or Supraselective (With Or Without Flush Aortogram); Left - Revascularize, Endovasc, Open/Percut, Iliac Artery, Unilat, Initial Vessel; W/Translum Stent, W/Angioplasty; Right - Revascularize, Endovasc, Open/Percut, Iliac Artery, Ea Add`L Ipsilateral; W/Translumin Stent, W/Angioplasty; Location [facility];   ENDARTERECTOMY  FEMORAL Left 12/01/2021   Procedure: ENDARTERECTOMY FEMORAL;  Surgeon: Annice Needy, MD;  Location: [facility];  Service: Vascular;  Laterality: Left;   ENDOVASCULAR REPAIR/STENT GRAFT Left 12/01/2021   Procedure: ENDOVASCULAR REPAIR/STENT GRAFT;  Surgeon: Annice Needy, MD;  Location: [facility];  Service: Cardiovascular;  Laterality: Left;   INCISION AND DRAINAGE OF WOUND Right 05/03/2022   Procedure: IRRIGATION AND DEBRIDEMENT RIGHT STUMP;  Surgeon: Annice Needy, MD;  Location: [facility];  Service: Vascular;  Laterality: Right;   IRRIGATION AND DEBRIDEMENT ABSCESS Right 08/28/2022   Procedure: AKA IRRIGATION AND DEBRIDEMENT ABSCESS;  Surgeon: Annice Needy, MD;  Location: [facility];  Service: Vascular;  Laterality: Right;   LAPAROTOMY N/A 08/08/2017   Procedure: EXPLORATORY LAPAROTOMY POSSIBLE BOWEL RESECTION;  Surgeon: Leafy Ro, MD;  Location: [facility];  Service: General;  Laterality: N/A;   LEFT HEART CATH AND CORONARY ANGIOGRAPHY N/A 05/17/2021   Procedure: LEFT HEART CATH AND CORONARY ANGIOGRAPHY;   Surgeon: Alwyn Pea, MD;  Location: [facility];  Service: Cardiovascular;  Laterality: N/A;   LEFT HEART CATH AND CORONARY ANGIOGRAPHY N/A 02/15/2023   Procedure: LEFT HEART CATH AND CORONARY ANGIOGRAPHY;  Surgeon: Marykay Lex, MD;  Location: [facility];  Service: Cardiovascular;  Laterality: N/A;   LOWER EXTREMITY ANGIOGRAPHY Left 11/17/2019   Procedure: LOWER EXTREMITY ANGIOGRAPHY;  Surgeon: Annice Needy, MD;  Location: [facility];  Service: Cardiovascular;  Laterality: Left;   LOWER EXTREMITY ANGIOGRAPHY Right 12/13/2020   Procedure: LOWER EXTREMITY ANGIOGRAPHY;  Surgeon: Annice Needy, MD;  Location: [facility];  Service: Cardiovascular;  Laterality: Right;   LOWER EXTREMITY ANGIOGRAPHY Right 01/10/2021   Procedure: LOWER EXTREMITY ANGIOGRAPHY;  Surgeon: Annice Needy, MD;  Location: [facility];  Service: Cardiovascular;  Laterality: Right;   LOWER EXTREMITY ANGIOGRAPHY Right 01/24/2021   Procedure: LOWER EXTREMITY ANGIOGRAPHY;  Surgeon: Annice Needy, MD;  Location: [facility];  Service: Cardiovascular;  Laterality: Right;   LOWER EXTREMITY ANGIOGRAPHY Bilateral 06/28/2021   Procedure: Lower Extremity Angiography;  Surgeon: Annice Needy, MD;  Location: [facility];  Service: Cardiovascular;  Laterality: Bilateral;   LOWER EXTREMITY ANGIOGRAPHY Right 11/28/2021   Procedure: LOWER EXTREMITY ANGIOGRAPHY;  Surgeon: Annice Needy, MD;  Location: [facility];  Service: Cardiovascular;  Laterality: Right;   LOWER EXTREMITY ANGIOGRAPHY Left 11/30/2021   Procedure: Lower Extremity Angiography;  Surgeon: Annice Needy, MD;  Location: [facility];  Service: Cardiovascular;  Laterality: Left;   LOWER EXTREMITY ANGIOGRAPHY Right 02/22/2022   Procedure: Lower Extremity Angiography;  Surgeon: Annice Needy, MD;  Location: [facility];  Service: Cardiovascular;  Laterality: Right;   LOWER EXTREMITY ANGIOGRAPHY Right  03/13/2022   Procedure: Lower Extremity Angiography;  Surgeon: Annice Needy, MD;  Location: [facility];  Service: Cardiovascular;  Laterality: Right;   LOWER EXTREMITY ANGIOGRAPHY Right 03/24/2022   Procedure: Lower Extremity Angiography;  Surgeon: Annice Needy, MD;  Location: [facility];  Service: Cardiovascular;  Laterality: Right;   LOWER EXTREMITY ANGIOGRAPHY Left 01/17/2023   Procedure: Lower Extremity Angiography;  Surgeon: Annice Needy, MD;  Location: [facility];  Service: Cardiovascular;  Laterality: Left;   LOWER EXTREMITY INTERVENTION N/A 11/19/2019   Procedure: LOWER EXTREMITY INTERVENTION;  Surgeon: Annice Needy, MD;  Location: [facility];  Service: Cardiovascular;  Laterality: N/A;   LOWER  EXTREMITY INTERVENTION Bilateral 06/29/2021   Procedure: LOWER EXTREMITY INTERVENTION;  Surgeon: Annice Needy, MD;  Location: [facility];  Service: Cardiovascular;  Laterality: Bilateral;   LOWER EXTREMITY INTERVENTION Left 01/18/2023   Procedure: LOWER EXTREMITY INTERVENTION;  Surgeon: Annice Needy, MD;  Location: [facility];  Service: Cardiovascular;  Laterality: Left;   TEE WITHOUT CARDIOVERSION N/A 06/22/2017   Procedure: TRANSESOPHAGEAL ECHOCARDIOGRAM (TEE);  Surgeon: Iran Ouch, MD;  Location: [facility];  Service: Cardiovascular;  Laterality: N/A;   TOTAL KNEE ARTHROPLASTY Right 07/11/2018   Procedure: TOTAL KNEE ARTHROPLASTY;  Surgeon: Christena Flake, MD;  Location: [facility];  Service: Orthopedics;  Laterality: Right;   VAGINAL HYSTERECTOMY     VISCERAL ARTERY INTERVENTION N/A 06/20/2017   Procedure: Visceral Artery Intervention, possible aortic thrombectomy;  Surgeon: Annice Needy, MD;  Location: [facility];  Service: Cardiovascular;  Laterality: N/A;   VISCERAL ARTERY INTERVENTION N/A 01/28/2018   Procedure: VISCERAL ARTERY INTERVENTION;  Surgeon: Annice Needy, MD;  Location: [facility];  Service: Cardiovascular;   Laterality: N/A;    Social History   Socioeconomic History   Marital status: Single    Spouse name: Not on file   Number of children: Not on file   Years of education: Not on file   Highest education level: Not on file  Occupational History   Occupation: Unemployed  Tobacco Use   Smoking status: Former    Packs/day: 1    Types: Cigarettes    Quit date: 2023    Years since quitting: 1.4   Smokeless tobacco: Never  Vaping Use   Vaping Use: Never used  Substance and Sexual Activity   Alcohol use: No   Drug use: No   Sexual activity: Not Currently  Other Topics Concern   Not on file  Social History Narrative   Lives in [place] with daughter   Social Determinants of Health   Financial Resource Strain: Not on file  Food Insecurity: No Food Insecurity (03/22/2023)   Hunger Vital Sign    Worried About Running Out of Food in the Last Year: Never true    Ran Out of Food in the Last Year: Never true  Transportation Needs: No Transportation Needs (03/22/2023)   PRAPARE - Administrator, Civil Service (Medical): No    Lack of Transportation (Non-Medical): No  Physical Activity: Not on file  Stress: Not on file  Social Connections: Not on file  Intimate Partner Violence: Not At Risk (03/22/2023)   Humiliation, Afraid, Rape, and Kick questionnaire    Fear of Current or Ex-Partner: No    Emotionally Abused: No    Physically Abused: No    Sexually Abused: No    Family History  Problem Relation Age of Onset   Hypertension Mother    Heart disease Mother    Heart attack Mother    Breast cancer Mother    Heart disease Father    Hypertension Father    Breast cancer Paternal Aunt    Allergies  Allergen Reactions   Gabapentin     Tremors    Bactrim [Sulfamethoxazole-Trimethoprim] Itching   I? Current Facility-Administered Medications  Medication Dose Route Frequency Provider Last Rate Last Admin   acetaminophen (TYLENOL) tablet 1,000 mg  1,000 mg Oral Q8H  Griffith, Kelly A, DO   1,000 mg at 03/27/23 0907   alum & mag hydroxide-simeth (MAALOX/MYLANTA) 200-200-20 MG/5ML suspension 30 mL  30 mL Oral Q6H  PRN Esaw Grandchild A, DO   30 mL at 03/25/23 0358   apixaban (ELIQUIS) tablet 5 mg  5 mg Oral BID Esaw Grandchild A, DO   5 mg at 03/27/23 1610   ascorbic acid (VITAMIN C) tablet 500 mg  500 mg Oral BID Annice Needy, MD   500 mg at 03/27/23 9604   atorvastatin (LIPITOR) tablet 80 mg  80 mg Oral QHS Annice Needy, MD   80 mg at 03/26/23 2120   bisacodyl (DULCOLAX) EC tablet 5 mg  5 mg Oral Daily PRN Pennie Banter, DO       diphenoxylate-atropine (LOMOTIL) 2.5-0.025 MG per tablet 1 tablet  1 tablet Oral QID PRN Annice Needy, MD       feeding supplement (ENSURE ENLIVE / ENSURE PLUS) liquid 237 mL  237 mL Oral BID BM Annice Needy, MD   237 mL at 03/27/23 0911   HYDROmorphone (DILAUDID) injection 0.5 mg  0.5 mg Intravenous Q4H PRN Esaw Grandchild A, DO   0.5 mg at 03/27/23 0724   ipratropium-albuterol (DUONEB) 0.5-2.5 (3) MG/3ML nebulizer solution 3 mL  3 mL Inhalation Q6H PRN Annice Needy, MD       isosorbide mononitrate (IMDUR) 24 hr tablet 120 mg  120 mg Oral Daily Annice Needy, MD   120 mg at 03/27/23 0907   meropenem (MERREM) 1 g in sodium chloride 0.9 % 100 mL IVPB  1 g Intravenous Q8H Orson Aloe, [facility]   Stopped at 03/27/23 0559   metoprolol tartrate (LOPRESSOR) tablet 12.5 mg  12.5 mg Oral BID Esaw Grandchild A, DO   12.5 mg at 03/25/23 2225   midodrine (PROAMATINE) tablet 5 mg  5 mg Oral TID WC Esaw Grandchild A, DO   5 mg at 03/27/23 5409   nystatin cream (MYCOSTATIN)   Topical BID Pennie Banter, DO   Given at 03/27/23 8119   oxyCODONE (Oxy IR/ROXICODONE) immediate release tablet 5-10 mg  5-10 mg Oral Q6H PRN Esaw Grandchild A, DO   10 mg at 03/27/23 1478   oxyCODONE (OXYCONTIN) 12 hr tablet 20 mg  20 mg Oral Q12H Griffith, Kelly A, DO       pantoprazole (PROTONIX) EC tablet 40 mg  40 mg Oral Daily Annice Needy, MD   40 mg at 03/27/23  0907   polyethylene glycol (MIRALAX / GLYCOLAX) packet 17 g  17 g Oral Daily Esaw Grandchild A, DO   17 g at 03/25/23 2956   pregabalin (LYRICA) capsule 200 mg  200 mg Oral TID Esaw Grandchild A, DO   200 mg at 03/27/23 2130   senna-docusate (Senokot-S) tablet 1 tablet  1 tablet Oral BID Esaw Grandchild A, DO   1 tablet at 03/25/23 2219   zinc sulfate capsule 220 mg  220 mg Oral Daily Annice Needy, MD   220 mg at 03/27/23 0907     Abtx:  Anti-infectives (From admission, onward)    Start     Dose/Rate Route Frequency Ordered Stop   03/24/23 1500  meropenem (MERREM) 1 g in sodium chloride 0.9 % 100 mL IVPB        1 g 200 mL/hr over 30 Minutes Intravenous Every 8 hours 03/24/23 1414     03/22/23 1600  vancomycin (VANCOREADY) IVPB 1500 mg/300 mL  Status:  Discontinued        1,500 mg 150 mL/hr over 120 Minutes Intravenous Every 24 hours 03/21/23 1837 03/24/23 1343   03/22/23  0000  ceFEPIme (MAXIPIME) 2 g in sodium chloride 0.9 % 100 mL IVPB  Status:  Discontinued        2 g 200 mL/hr over 30 Minutes Intravenous Every 8 hours 03/21/23 1837 03/24/23 1343   03/21/23 1700  vancomycin (VANCOREADY) IVPB 1750 mg/350 mL        1,750 mg 175 mL/hr over 120 Minutes Intravenous  Once 03/21/23 1641 03/21/23 2123   03/21/23 1645  vancomycin (VANCOCIN) IVPB 1000 mg/200 mL premix  Status:  Discontinued        1,000 mg 200 mL/hr over 60 Minutes Intravenous  Once 03/21/23 1637 03/21/23 1641   03/21/23 1645  piperacillin-tazobactam (ZOSYN) IVPB 3.375 g        3.375 g 100 mL/hr over 30 Minutes Intravenous  Once 03/21/23 1637 03/21/23 1709       REVIEW OF SYSTEMS:  Const:  fever, chills, negative weight loss Eyes: negative diplopia or visual changes, negative eye pain ENT: negative coryza, negative sore throat Resp: negative cough, hemoptysis, dyspnea Cards: negative for chest pain, palpitations, lower extremity edema GU: negative for frequency, dysuria and hematuria GI: Negative for abdominal pain,  diarrhea, bleeding, constipation Skin: negative for rash and pruritus Heme: negative for easy bruising and gum/nose bleeding MS: severe left stump pain Neurolo:negative for headaches, dizziness, vertigo, memory problems  Psych:  anxiety, depression  Endocrine: negative for thyroid, diabetes Allergy/Immunology- bactrim - rash Objective:  VITALS:  BP 108/74 (BP Location: Right Arm)   Pulse 79   Temp 97.7 F (36.5 C)   Resp 18   Wt 81.6 kg   SpO2 98%   BMI 32.92 kg/m   PHYSICAL EXAM:  General: Alert, cooperative, in  distress, pale.  Head: Normocephalic, without obvious abnormality, atraumatic. Eyes: Conjunctivae clear, anicteric sclerae. Pupils are equal ENT Nares normal. No drainage or sinus tenderness. Lips, mucosa, and tongue normal. No Thrush Neck: Supple, symmetrical, no adenopathy, thyroid: non tender no carotid bruit and no JVD. Back: No CVA tenderness. Lungs: Clear to auscultation bilaterally. No Wheezing or Rhonchi. No rales. Heart: Regular rate and rhythm, no murmur, rub or gallop. Abdomen: Soft, non-tender,not distended. Bowel sounds normal. No masses Extremities: rt AKA- stump site wound almost healed Left AKA- has wound vac Skin: No rashes or lesions. Or bruising Lymph: Cervical, supraclavicular normal. Neurologic: Grossly non-focal Pertinent Labs Lab Results CBC    Component Value Date/Time   WBC 8.2 03/27/2023 0422   RBC 3.34 (L) 03/27/2023 0422   RBC 3.30 (L) 03/27/2023 0422   HGB 8.8 (L) 03/27/2023 0422   HGB 13.4 02/12/2015 1137   HCT 29.5 (L) 03/27/2023 0422   HCT 40.9 02/12/2015 1137   PLT 260 03/27/2023 0422   PLT 272 02/12/2015 1137   MCV 88.3 03/27/2023 0422   MCV 90 02/12/2015 1137   MCH 26.3 03/27/2023 0422   MCHC 29.8 (L) 03/27/2023 0422   RDW 16.4 (H) 03/27/2023 0422   RDW 14.8 (H) 02/12/2015 1137   LYMPHSABS 1.6 02/16/2023 1141   LYMPHSABS 1.7 08/27/2014 1145   MONOABS 0.4 02/16/2023 1141   MONOABS 0.4 08/27/2014 1145   EOSABS 0.3  02/16/2023 1141   EOSABS 0.0 08/27/2014 1145   BASOSABS 0.0 02/16/2023 1141   BASOSABS 0.0 08/27/2014 1145       Latest Ref Rng & Units 03/27/2023    4:22 AM 03/25/2023    2:59 PM 03/24/2023    4:31 AM  CMP  Glucose 70 - 99 mg/dL 161   096   BUN  8 - 23 mg/dL 14   17   Creatinine 4.09 - 1.00 mg/dL 8.11  9.14  7.82   Sodium 135 - 145 mmol/L 140   139   Potassium 3.5 - 5.1 mmol/L 4.3   4.1   Chloride 98 - 111 mmol/L 105   111   CO2 22 - 32 mmol/L 25   23   Calcium 8.9 - 10.3 mg/dL 8.6   8.4       Microbiology: Recent Results (from the past 240 hour(s))  Blood Culture (routine x 2)     Status: None   Collection Time: 03/21/23  4:21 PM   Specimen: BLOOD  Result Value Ref Range Status   Specimen Description BLOOD BLOOD RIGHT HAND  Final   Special Requests   Final    BOTTLES DRAWN AEROBIC AND ANAEROBIC Blood Culture adequate volume   Culture   Final    NO GROWTH 5 DAYS Performed at [facility], [address] [place], [place] [address]    Report Status 03/26/2023 FINAL  Final  Blood Culture (routine x 2)     Status: None   Collection Time: 03/21/23  4:49 PM   Specimen: BLOOD  Result Value Ref Range Status   Specimen Description BLOOD BLOOD LEFT HAND  Final   Special Requests   Final    BOTTLES DRAWN AEROBIC AND ANAEROBIC Blood Culture results may not be optimal due to an inadequate volume of blood received in culture bottles   Culture   Final    NO GROWTH 5 DAYS Performed at [facility], [address], [place], [place] [address]    Report Status 03/26/2023 FINAL  Final  Aerobic/Anaerobic Culture w Gram Stain (surgical/deep wound)     Status: None (Preliminary result)   Collection Time: 03/22/23 11:51 AM   Specimen: Path Tissue  Result Value Ref Range Status   Specimen Description   Final    TISSUE Performed at [facility], [address] [place], [place] [address]    Special Requests SWAB OF LEFT AKA STUMP WOUND  Final   Gram Stain    Final    RARE WBC PRESENT,BOTH PMN AND MONONUCLEAR RARE GRAM POSITIVE COCCI RARE GRAM POSITIVE RODS RARE GRAM NEGATIVE RODS Performed at [facility], [address], [place], [place] [address]    Culture   Final    FEW ESCHERICHIA COLI FEW PROTEUS MIRABILIS Confirmed Extended Spectrum Beta-Lactamase Producer (ESBL).  In bloodstream infections from ESBL organisms, carbapenems are preferred over piperacillin/tazobactam. They are shown to have a lower risk of mortality. RARE DIPHTHEROIDS(CORYNEBACTERIUM SPECIES) Standardized susceptibility testing for this organism is not available. NO ANAEROBES ISOLATED; CULTURE IN PROGRESS FOR 5 DAYS    Report Status PENDING  Incomplete   Organism ID, Bacteria ESCHERICHIA COLI  Final   Organism ID, Bacteria PROTEUS MIRABILIS  Final      Susceptibility   Escherichia coli - MIC*    AMPICILLIN >=32 RESISTANT Resistant     CEFEPIME >=32 RESISTANT Resistant     CEFTAZIDIME >=64 RESISTANT Resistant     CEFTRIAXONE >=64 RESISTANT Resistant     CIPROFLOXACIN >=4 RESISTANT Resistant     GENTAMICIN <=1 SENSITIVE Sensitive     IMIPENEM <=0.25 SENSITIVE Sensitive     TRIMETH/SULFA >=320 RESISTANT Resistant     AMPICILLIN/SULBACTAM 16 INTERMEDIATE Intermediate     PIP/TAZO 64 INTERMEDIATE Intermediate     * FEW ESCHERICHIA COLI   Proteus mirabilis - MIC*  AMPICILLIN <=2 SENSITIVE Sensitive     CEFEPIME <=0.12 SENSITIVE Sensitive     CEFTAZIDIME <=1 SENSITIVE Sensitive     CEFTRIAXONE <=0.25 SENSITIVE Sensitive     CIPROFLOXACIN <=0.25 SENSITIVE Sensitive     GENTAMICIN <=1 SENSITIVE Sensitive     IMIPENEM 4 SENSITIVE Sensitive     TRIMETH/SULFA <=20 SENSITIVE Sensitive     AMPICILLIN/SULBACTAM <=2 SENSITIVE Sensitive     PIP/TAZO <=4 SENSITIVE Sensitive     * FEW PROTEUS MIRABILIS    IMAGING RESULTS: CT left femur- no osteo I have personally reviewed the films ? Impression/Recommendation ?62 y.o. female with a history of A-fib, CHF, PAD,  COPD, HLD, hypertension, right AKA, stump infection with ESBL E. coli and Enterococcus and MRSA needing revision of the amputation site and IV antibiotics followed by oral omadacycline, last seen by me in my office in April 2024, left leg irreversible ischemia April 2024 and was in [facility] 02/15/23-02/24/23 needing left above-the-knee amputation on 02/21/2023 She went for a vascular follow up appt on 03/21/23 and they noted the surgical site was necrotic and had purulent discharge. She was asked to get admitted  Left AKA stump infection- ESBL ecoli and proteus I wound Currently on meropenem Inclined to cover for MRSA which was infecting the rt AKA in the past She is goig to need Iv antibitcs for 4 -6 weeks  Rt AKA stump= wound Is very small and almost closed Had chronic infection for 3 months ( MRSA, SBL, enterococcus) and took many courses of antibiotics  Current smoker Severe PAD with stents  CAD Imdur Metoprolol atorvastatin  HTN? ? __AFIB controlled- on eliquis  Anemia  _________________________________________________ Discussed with patient, requesting provider Note:  This document was prepared using Dragon voice recognition software and may include unintentional dictation errors.

## 2023-03-27 NOTE — Progress Notes (Signed)
Progress Note   Patient: Carla [name] ZOX:[id number] DOB: 1961/06/08 DOA: 03/21/2023     6 DOS: the patient was seen and examined on 03/27/2023   Brief hospital course: [name] is a 62 year old female with history of depression, anxiety, hyperlipidemia, hypertension, tobacco use, paroxysmal atrial fibrillation on Eliquis, obesity, atherosclerosis, CAD, PAD, history of left stump infection post amputation, who presented on 03/21/2023 from vascular outpatient for due to necrosis of the left AKA stump.  Pt was admitted and started on IV antibiotics. Vascular surgery was consulted and took patient to the OR on 5/30 for debridement and wound vac placement.   Plan is to return to OR next Wednesday, 03/28/23.  Assessment and Plan: * Amputation stump infection Carla [name]) Vascular service consulted. 5/30 - taken to OR for debridement and wound vac placement 6/1 - wound cultures growing ESBL E coli --Plan return to OR Wed 6/5 for first wound vac change. --Changed IV antibiotics - Vanc, Cefepime >> Merrem --ID consult for d/c abx recs --Pain control per orders, adjusted with scheduled Tylenol, scheduled Oxycontin, PRN Oxy, PRN IV for breakthrough --Titrate PO pain meds in prep for d/c - wean off IV --Bowel regimen  Hypotension Likely due to pain meds and infection. Low BP's have led to pain meds needing to be held.   --Reduce metoprolol 50 >> 25 >> 12.5 mg BID --Started midodrine 5 mg TID WC for now --Wean off midodrine as BP tolerates, expect that will be when requiring less pain meds  --Maintain MAP>65  Hypokalemia Resolved with replacement on 5/31 Monitor BMP, check Mg level  Chronic anticoagulation Eliquis held on admission -resumed  Acute blood loss anemia Hbg on admission was 12.2, trended down post-operatively to 10.4 >> 9.7 >> 9.4. No signs of active bleeding, some serosanginous drainage in wound vac canister. Anemia panel c/w iron deficiency. --Pending B12  --PO iron  supplement started --Monitor CBC  PVD (peripheral vascular disease) (HCC) Complicated by bilateral AKA's. Mgmt per vascular surgery  Paroxysmal atrial fibrillation (HCC) HR's controlled. Eliquis on hold, resume when okay with vascular. Continue metoprolol  COPD (chronic obstructive pulmonary disease) (HCC) Stable, not exacerbated. Continue bronchodilators PRN  Recurrent major depressive disorder (HCC) Appears no longer on Celexa  Iron deficiency anemia Start PO iron supplement.  Left thigh pain Due to AKA stump infection, with underlying chronic pain syndrome --Pain control per orders  Cellulitis of left thigh See amputation stump infection  Pressure injury of skin I agree with the wound description as below. --Continue local wound care --Frequent repositioning --Diligent hygiene --Monitor for signs of infection Pressure Injury 03/22/23 Buttocks Left Stage 2 -  Partial thickness loss of dermis presenting as a shallow open injury with a red, pink wound bed without slough. (Active)  03/22/23 1700 (This RN was the 2nd RN at initial skin check on admission on 03/22/23.  There was a small stage II to left buttock at that time.  This RN and admitting RN placed a foam dressing to the coccyx at that time)  Location: Buttocks  Location Orientation: Left  Staging: Stage 2 -  Partial thickness loss of dermis presenting as a shallow open injury with a red, pink wound bed without slough.  Wound Description (Comments):   Present on Admission: Yes      Anxiety and depression Appears no longer on Celexa  Adjustment disorder with anxiety Appears no longer taking Celexa  Chronic pain syndrome Continue home Lyrica. Acute pain control as outlined above. Highly suspect hyperalgesia complicating  pain control.  GERD (gastroesophageal reflux disease) Continue PPI  Essential hypertension Continue home Imdur, metoprolol (reduced from 50 >> 25 mg BID due to hypotension)         Subjective: Pt awake sitting up in bed this AM. She was on facetime with someone.  She says she is doing "fair" then begins to get tearful again saying her pain continues to be 10/10 in severity constantly without any relief. Aside from pain, no acute complaints.       Physical Exam: Vitals:   03/26/23 1953 03/26/23 2308 03/27/23 0305 03/27/23 0808  BP: 96/62 (!) 125/98 122/68 108/74  Pulse: 79 71 87 79  Resp: 18 18 18 18   Temp: 98.2 F (36.8 C) 98.1 F (36.7 C) 98.2 F (36.8 C) 97.7 F (36.5 C)  TempSrc: Oral Oral    SpO2: 97% 97% 96% 98%  Weight:       General exam: awake, alert, tearful, no acute distress, obese HEENT: moist mucus membranes, hearing grossly normal  Respiratory system: on room air, normal respiratory effort. Cardiovascular system: normal S1/S2, RRR,  Central nervous system: A&O x3. no gross focal neurologic deficits, normal speech Extremities: b/l AKA's with new wound vac on left stump appears well sealed with serosanguinous fluid in tubing Skin: dry, intact, normal temperature Psychiatry: normal mood, congruent affect, judgement and insight appear normal   Data Reviewed: Notable labs -- glucose 122, Ca 8.6 othrwise normal BMP.  WBC with Hbg 9.4 >> 8.8, normal WBC and platelets Anemia panel - iron low 20, normal TIBC and ferritin and folate, elevated immature retics   Micro   - L AKA stump wound cultures -- ESBL E coli sensitive to gentamicin and imipenem  - Blood cultures 5/29 -- neg to date    Family Communication: None present  Disposition: Status is: Inpatient Remains inpatient appropriate because: ongoing vascular evaluation, remains on IV antibiotics, further OR procedure/s planned   Planned Discharge Destination: Home    Time spent: 35 minutes  Author: Pennie Banter, DO 03/27/2023 12:47 PM  For on call review [email or web address].

## 2023-03-27 NOTE — Assessment & Plan Note (Addendum)
Continue iron supplement.

## 2023-03-27 NOTE — Progress Notes (Signed)
Progress Note    03/27/2023 9:59 AM 5 Days Post-Op  Subjective:  Carla [name] is a 62 year old female who presented to the vein and vascular clinic with an infected left stump status post left above-the-knee amputation.  She was then sent to the emergency department where she was admitted for antibiotics and incision and drainage of infection.   Patient is now postop day 5 from incision and drainage with wound VAC placement to the left stump.  Wound VAC started alarming last night.  Upon examination this morning wound VAC sponges clogged.  Patient is recovering as expected.  Patient cries consistently and endorses 10 out of 10 pain from the surgical site.  Wound VAC is turned off for today.  Will be replaced in the OR tomorrow.   Vitals:   03/27/23 0305 03/27/23 0808  BP: 122/68 108/74  Pulse: 87 79  Resp: 18 18  Temp: 98.2 F (36.8 C) 97.7 F (36.5 C)  SpO2: 96% 98%   Physical Exam: Cardiac:  HX Atrial fibrillation rate in the 90's Lungs:  Clear on auscultation throughout.  Incisions:  Left stump incision dressed with wound vac. Working well Extremities:  Bilateral lower extremity AKA's. Left with infection and now wound vac  Abdomen:  Positive bowel sounds. Right lower quadrant hernia.  Neurologic: AAOX3 follows commands.   CBC    Component Value Date/Time   WBC 8.2 03/27/2023 0422   RBC 3.34 (L) 03/27/2023 0422   RBC 3.30 (L) 03/27/2023 0422   HGB 8.8 (L) 03/27/2023 0422   HGB 13.4 02/12/2015 1137   HCT 29.5 (L) 03/27/2023 0422   HCT 40.9 02/12/2015 1137   PLT 260 03/27/2023 0422   PLT 272 02/12/2015 1137   MCV 88.3 03/27/2023 0422   MCV 90 02/12/2015 1137   MCH 26.3 03/27/2023 0422   MCHC 29.8 (L) 03/27/2023 0422   RDW 16.4 (H) 03/27/2023 0422   RDW 14.8 (H) 02/12/2015 1137   LYMPHSABS 1.6 02/16/2023 1141   LYMPHSABS 1.7 08/27/2014 1145   MONOABS 0.4 02/16/2023 1141   MONOABS 0.4 08/27/2014 1145   EOSABS 0.3 02/16/2023 1141   EOSABS 0.0 08/27/2014 1145    BASOSABS 0.0 02/16/2023 1141   BASOSABS 0.0 08/27/2014 1145    BMET    Component Value Date/Time   NA 140 03/27/2023 0422   NA 140 02/12/2015 1137   K 4.3 03/27/2023 0422   K 2.9 (L) 02/12/2015 1137   CL 105 03/27/2023 0422   CL 107 02/12/2015 1137   CO2 25 03/27/2023 0422   CO2 27 02/12/2015 1137   GLUCOSE 122 (H) 03/27/2023 0422   GLUCOSE 120 (H) 02/12/2015 1137   BUN 14 03/27/2023 0422   BUN 15 02/12/2015 1137   CREATININE 0.44 03/27/2023 0422   CREATININE 0.78 02/12/2015 1137   CALCIUM 8.6 (L) 03/27/2023 0422   CALCIUM 9.0 02/12/2015 1137   GFRNONAA >60 03/27/2023 0422   GFRNONAA >60 02/12/2015 1137   GFRAA >60 11/23/2019 1330   GFRAA >60 02/12/2015 1137    INR    Component Value Date/Time   INR 1.6 (H) 03/21/2023 1627   INR 0.8 07/23/2012 0433     Intake/Output Summary (Last 24 hours) at 03/27/2023 0959 Last data filed at 03/27/2023 0647 Gross per 24 hour  Intake 1045 ml  Output 1700 ml  Net -655 ml     Assessment/Plan:  62 y.o. female is s/p Left Lower extremity AKA wound washout with Vac application 5 Days Post-Op   PLAN: Patient will  be taken back to the operating room for first wound VAC change on Wednesday, March 28, 2023.  Will most likely entail wound washout and possibly antibiotic beads placed.  Patient is aware and agrees to the timeline.   DVT prophylaxis:  Eliquis 5 mg twice daily    [name] 03/27/2023 9:59 AM

## 2023-03-28 ENCOUNTER — Encounter: Payer: Self-pay | Admitting: Internal Medicine

## 2023-03-28 ENCOUNTER — Encounter
Admission: EM | Disposition: A | Discharge status: Home / Self Care | Payer: Self-pay | Source: Home / Self Care | Attending: Internal Medicine

## 2023-03-28 ENCOUNTER — Inpatient Hospital Stay: Payer: 59 | Admitting: Anesthesiology

## 2023-03-28 ENCOUNTER — Other Ambulatory Visit: Payer: Self-pay

## 2023-03-28 DIAGNOSIS — T8781 Dehiscence of amputation stump: Secondary | ICD-10-CM

## 2023-03-28 DIAGNOSIS — B964 Proteus (mirabilis) (morganii) as the cause of diseases classified elsewhere: Secondary | ICD-10-CM

## 2023-03-28 DIAGNOSIS — T8743 Infection of amputation stump, right lower extremity: Secondary | ICD-10-CM | POA: Diagnosis not present

## 2023-03-28 DIAGNOSIS — T8740 Infection of amputation stump, unspecified extremity: Secondary | ICD-10-CM | POA: Diagnosis not present

## 2023-03-28 DIAGNOSIS — T8744 Infection of amputation stump, left lower extremity: Secondary | ICD-10-CM | POA: Diagnosis not present

## 2023-03-28 DIAGNOSIS — Z89612 Acquired absence of left leg above knee: Secondary | ICD-10-CM | POA: Diagnosis not present

## 2023-03-28 DIAGNOSIS — B9620 Unspecified Escherichia coli [E. coli] as the cause of diseases classified elsewhere: Secondary | ICD-10-CM | POA: Diagnosis not present

## 2023-03-28 HISTORY — PX: APPLICATION OF WOUND VAC: SHX5189

## 2023-03-28 LAB — CBC
HCT: 28.2 % — ABNORMAL LOW (ref 36.0–46.0)
Hemoglobin: 8.8 g/dL — ABNORMAL LOW (ref 12.0–15.0)
MCH: 27.2 pg (ref 26.0–34.0)
MCHC: 31.2 g/dL (ref 30.0–36.0)
MCV: 87 fL (ref 80.0–100.0)
Platelets: 271 10*3/uL (ref 150–400)
RBC: 3.24 MIL/uL — ABNORMAL LOW (ref 3.87–5.11)
RDW: 16.6 % — ABNORMAL HIGH (ref 11.5–15.5)
WBC: 8 10*3/uL (ref 4.0–10.5)
nRBC: 0 % (ref 0.0–0.2)

## 2023-03-28 LAB — CK: Total CK: 14 U/L — ABNORMAL LOW (ref 38–234)

## 2023-03-28 SURGERY — APPLICATION, WOUND VAC
Anesthesia: General | Site: Leg Lower | Laterality: Left

## 2023-03-28 MED ORDER — FENTANYL CITRATE (PF) 100 MCG/2ML IJ SOLN
INTRAMUSCULAR | Status: DC | PRN
  Administered 2023-03-28: 25 ug via INTRAVENOUS

## 2023-03-28 MED ORDER — CYANOCOBALAMIN 1000 MCG/ML IJ SOLN
1000.0000 ug | Freq: Every day | INTRAMUSCULAR | Status: AC
  Administered 2023-03-28 – 2023-04-03 (×7): 1000 ug via INTRAMUSCULAR
  Filled 2023-03-28 (×7): qty 1

## 2023-03-28 MED ORDER — OXYCODONE HCL 5 MG/5ML PO SOLN: 5.0000 mg | Freq: Once | ORAL | Status: AC | PRN

## 2023-03-28 MED ORDER — OXYCODONE HCL 5 MG PO TABS
5.0000 mg | ORAL_TABLET | Freq: Once | ORAL | Status: AC | PRN
  Administered 2023-03-28: 5 mg via ORAL

## 2023-03-28 MED ORDER — CHLORHEXIDINE GLUCONATE CLOTH 2 % EX PADS: 6.0000 | MEDICATED_PAD | Freq: Once | CUTANEOUS | Status: DC

## 2023-03-28 MED ORDER — KETAMINE HCL 10 MG/ML IJ SOLN
INTRAMUSCULAR | Status: DC | PRN
  Administered 2023-03-28: 10 mg via INTRAVENOUS
  Administered 2023-03-28: 20 mg via INTRAVENOUS

## 2023-03-28 MED ORDER — FAMOTIDINE 20 MG PO TABS: 40.0000 mg | ORAL_TABLET | Freq: Once | ORAL | Status: DC | PRN

## 2023-03-28 MED ORDER — FENTANYL CITRATE (PF) 100 MCG/2ML IJ SOLN
INTRAMUSCULAR | Status: AC
  Filled 2023-03-28: qty 2

## 2023-03-28 MED ORDER — SODIUM CHLORIDE 0.9 % IV SOLN: INTRAVENOUS | Status: DC

## 2023-03-28 MED ORDER — ONDANSETRON HCL 4 MG/2ML IJ SOLN: 4.0000 mg | Freq: Four times a day (QID) | INTRAMUSCULAR | Status: DC | PRN

## 2023-03-28 MED ORDER — OXYCODONE HCL 5 MG PO TABS
ORAL_TABLET | ORAL | Status: AC
  Filled 2023-03-28: qty 1

## 2023-03-28 MED ORDER — SODIUM CHLORIDE 0.9 % IV SOLN
8.0000 mg/kg | Freq: Every day | INTRAVENOUS | Status: DC
  Administered 2023-03-28 – 2023-04-17 (×21): 500 mg via INTRAVENOUS
  Filled 2023-03-28 (×21): qty 10

## 2023-03-28 MED ORDER — ATORVASTATIN CALCIUM 20 MG PO TABS
40.0000 mg | ORAL_TABLET | Freq: Every day | ORAL | Status: DC
  Administered 2023-03-28 – 2023-04-16 (×20): 40 mg via ORAL
  Filled 2023-03-28 (×21): qty 2

## 2023-03-28 MED ORDER — DIPHENHYDRAMINE HCL 50 MG/ML IJ SOLN: 50.0000 mg | Freq: Once | INTRAMUSCULAR | Status: DC | PRN

## 2023-03-28 MED ORDER — MIDAZOLAM HCL 2 MG/2ML IJ SOLN
INTRAMUSCULAR | Status: AC
  Filled 2023-03-28: qty 2

## 2023-03-28 MED ORDER — ACETAMINOPHEN 10 MG/ML IV SOLN
1000.0000 mg | Freq: Once | INTRAVENOUS | Status: DC | PRN
  Administered 2023-03-28: 1000 mg via INTRAVENOUS

## 2023-03-28 MED ORDER — ACETAMINOPHEN 10 MG/ML IV SOLN
INTRAVENOUS | Status: AC
  Filled 2023-03-28: qty 100

## 2023-03-28 MED ORDER — MIDAZOLAM HCL 2 MG/ML PO SYRP: 8.0000 mg | ORAL_SOLUTION | Freq: Once | ORAL | Status: DC | PRN

## 2023-03-28 MED ORDER — PROPOFOL 10 MG/ML IV BOLUS
INTRAVENOUS | Status: DC | PRN
  Administered 2023-03-28: 50 mg via INTRAVENOUS
  Administered 2023-03-28: 20 mg via INTRAVENOUS

## 2023-03-28 MED ORDER — PROMETHAZINE HCL 25 MG/ML IJ SOLN: 6.2500 mg | INTRAMUSCULAR | Status: DC | PRN

## 2023-03-28 MED ORDER — HYDROMORPHONE HCL 1 MG/ML IJ SOLN
1.0000 mg | Freq: Once | INTRAMUSCULAR | Status: AC | PRN
  Administered 2023-03-28: 1 mg via INTRAVENOUS
  Filled 2023-03-28: qty 1

## 2023-03-28 MED ORDER — FOLIC ACID 1 MG PO TABS
1.0000 mg | ORAL_TABLET | Freq: Every day | ORAL | Status: DC
  Administered 2023-03-28 – 2023-04-17 (×21): 1 mg via ORAL
  Filled 2023-03-28 (×20): qty 1

## 2023-03-28 MED ORDER — FENTANYL CITRATE PF 50 MCG/ML IJ SOSY
12.5000 ug | PREFILLED_SYRINGE | Freq: Once | INTRAMUSCULAR | Status: AC | PRN
  Administered 2023-04-02: 12.5 ug via INTRAVENOUS
  Filled 2023-03-28: qty 1

## 2023-03-28 MED ORDER — PROPOFOL 500 MG/50ML IV EMUL
INTRAVENOUS | Status: DC | PRN
  Administered 2023-03-28: 100 ug/kg/min via INTRAVENOUS

## 2023-03-28 MED ORDER — PROPOFOL 10 MG/ML IV BOLUS
INTRAVENOUS | Status: AC
  Filled 2023-03-28: qty 40

## 2023-03-28 MED ORDER — FENTANYL CITRATE (PF) 100 MCG/2ML IJ SOLN
25.0000 ug | INTRAMUSCULAR | Status: DC | PRN
  Administered 2023-03-28: 25 ug via INTRAVENOUS
  Administered 2023-03-28: 50 ug via INTRAVENOUS
  Administered 2023-03-28: 25 ug via INTRAVENOUS
  Administered 2023-03-28: 50 ug via INTRAVENOUS

## 2023-03-28 MED ORDER — SODIUM CHLORIDE 0.9 % IR SOLN
Status: DC | PRN
  Administered 2023-03-28: 3000 mL

## 2023-03-28 MED ORDER — MIDAZOLAM HCL 2 MG/2ML IJ SOLN
INTRAMUSCULAR | Status: DC | PRN
  Administered 2023-03-28: 2 mg via INTRAVENOUS

## 2023-03-28 MED ORDER — VITAMIN D (ERGOCALCIFEROL) 1.25 MG (50000 UNIT) PO CAPS
50000.0000 [IU] | ORAL_CAPSULE | ORAL | Status: DC
  Administered 2023-03-28 – 2023-04-11 (×3): 50000 [IU] via ORAL
  Filled 2023-03-28 (×3): qty 1

## 2023-03-28 MED ORDER — KETAMINE HCL 50 MG/5ML IJ SOSY
PREFILLED_SYRINGE | INTRAMUSCULAR | Status: AC
  Filled 2023-03-28: qty 5

## 2023-03-28 MED ORDER — METHYLPREDNISOLONE SODIUM SUCC 125 MG IJ SOLR: 125.0000 mg | Freq: Once | INTRAMUSCULAR | Status: DC | PRN

## 2023-03-28 MED ORDER — MIDAZOLAM HCL 2 MG/2ML IJ SOLN: 1.0000 mg | Freq: Once | INTRAMUSCULAR | Status: DC

## 2023-03-28 MED ORDER — VITAMIN B-12 1000 MCG PO TABS
1000.0000 ug | ORAL_TABLET | Freq: Every day | ORAL | Status: DC
  Administered 2023-04-04 – 2023-04-17 (×14): 1000 ug via ORAL
  Filled 2023-03-28 (×14): qty 1

## 2023-03-28 MED ORDER — DROPERIDOL 2.5 MG/ML IJ SOLN: 0.6250 mg | Freq: Once | INTRAMUSCULAR | Status: DC | PRN

## 2023-03-28 SURGICAL SUPPLY — 27 items
CANISTER WOUND CARE 500ML ATS (WOUND CARE) ×1 IMPLANT
CLEANER CAUTERY TIP 5X5 PAD (MISCELLANEOUS) IMPLANT
DRAPE INCISE IOBAN 66X45 STRL (DRAPES) ×1 IMPLANT
DRAPE LAPAROTOMY 100X77 ABD (DRAPES) ×1 IMPLANT
DRSG VAC GRANUFOAM LG (GAUZE/BANDAGES/DRESSINGS) ×1 IMPLANT
DRSG VAC GRANUFOAM MED (GAUZE/BANDAGES/DRESSINGS) ×1 IMPLANT
ELECT REM PT RETURN 9FT ADLT (ELECTROSURGICAL) ×1
ELECTRODE REM PT RTRN 9FT ADLT (ELECTROSURGICAL) ×1 IMPLANT
GAUZE 4X4 16PLY ~~LOC~~+RFID DBL (SPONGE) ×1 IMPLANT
GLOVE BIO SURGEON STRL SZ7 (GLOVE) ×2 IMPLANT
GOWN STRL REUS W/ TWL LRG LVL3 (GOWN DISPOSABLE) ×2 IMPLANT
GOWN STRL REUS W/ TWL XL LVL3 (GOWN DISPOSABLE) ×1 IMPLANT
GOWN STRL REUS W/TWL LRG LVL3 (GOWN DISPOSABLE) ×1
GOWN STRL REUS W/TWL XL LVL3 (GOWN DISPOSABLE) ×1
IV NS IRRIG 3000ML ARTHROMATIC (IV SOLUTION) IMPLANT
KIT TURNOVER KIT A (KITS) ×1 IMPLANT
MANIFOLD NEPTUNE II (INSTRUMENTS) ×1 IMPLANT
NS IRRIG 1000ML POUR BTL (IV SOLUTION) ×1 IMPLANT
PACK BASIN MINOR ARMC (MISCELLANEOUS) ×1 IMPLANT
PULSAVAC PLUS IRRIG FAN TIP (DISPOSABLE) ×1
SOL PREP PVP 2OZ (MISCELLANEOUS) ×1
SOLUTION PREP PVP 2OZ (MISCELLANEOUS) ×1 IMPLANT
SPONGE T-LAP 18X18 ~~LOC~~+RFID (SPONGE) ×1 IMPLANT
SWAB CULTURE AMIES ANAERIB BLU (MISCELLANEOUS) IMPLANT
TIP FAN IRRIG PULSAVAC PLUS (DISPOSABLE) IMPLANT
TRAP FLUID SMOKE EVACUATOR (MISCELLANEOUS) ×1 IMPLANT
WATER STERILE IRR 500ML POUR (IV SOLUTION) ×1 IMPLANT

## 2023-03-28 NOTE — Op Note (Signed)
    OPERATIVE NOTE   PROCEDURE: Irrigation and debridement of skin, soft tissue, and muscle to the left AKA stump for approximately 150 cm2 with negative pressure dressing placement   PRE-OPERATIVE DIAGNOSIS: Nonviable tissue and infection of the the left AKA wound  POST-OPERATIVE DIAGNOSIS: Same as above  SURGEON: Festus Barren, MD  ASSISTANT(S): Rolla Plate, NP  ANESTHESIA: MAC  ESTIMATED BLOOD LOSS: 20 cc  FINDING(S): None  SPECIMEN(S):  culture swab sent  INDICATIONS:   TENNILLE SANCHEZ is a 62 y.o. female who presents with a left AKA wound without continued breakdown.  This was debrided in the operating room last week and continues to have murky drainage and has had enlargement of the wound with now having the entirety of the wound dehisced and not just the medial portion. An assistant was present during the procedure to help facilitate the exposure and expedite the procedure.   DESCRIPTION: After obtaining full informed written consent, the patient was brought back to the operating room and placed supine upon the operating table.  The patient received IV antibiotics prior to induction.  After obtaining adequate anesthesia, the patient was prepped and draped in the standard fashion. The assistant provided retraction and mobilization to help facilitate exposure and expedite the procedure throughout the entire procedure.  This included following suture, using retractors, and optimizing lighting.  The wound was then opened and excisional debridement was performed to the skin, soft tissue, and down to the muscle fascia to remove all clearly non-viable tissue.  This was particularly present in the lateral portion of the wound that was not opened previously as well as the medial portion of the wound.  All staples were taken out as the wound had dehisced and there was significant amount of nonviable tissue particularly in the adipose level.  The tissue was taken back to bleeding tissue that  appeared viable.  The midportion of the wound had some fibrinous exudate from previous debridement but this was better than the medial and lateral portions.  The wound now measured roughly 20 cm across and had about 7 to 8 cm in width and about 5 to 6 cm in depth.  The debridement was performed with Metzenbaum scissors and electrocautery and encompassed an area of approximately 150 cm2.  The wound was irrigated copiously with pulse lavage saline irrigation.  After all clearly non-viable tissue was removed, a large VAC sponge was cut to fit the wound.  Strips of [place] were used and this was connected to suction tubing with a good occlusive seal obtained when connected to the suction box.. The patient was then awakened from anesthesia and taken to the recovery room in stable condition having tolerated the procedure well.  COMPLICATIONS: none  CONDITION: stable  Festus Barren  03/28/2023, 11:34 AM   This note was created with Dragon Medical transcription system. Any errors in dictation are purely unintentional.

## 2023-03-28 NOTE — Interval H&P Note (Signed)
History and Physical Interval Note:  03/28/2023 10:01 AM  Carla [name]  has presented today for surgery, with the diagnosis of wound left stump.  The various methods of treatment have been discussed with the patient and family. After consideration of risks, benefits and other options for treatment, the patient has consented to  Procedure(s): WOUND VAC EXCHANGE (Left) as a surgical intervention.  The patient's history has been reviewed, patient examined, no change in status, stable for surgery.  I have reviewed the patient's chart and labs.  Questions were answered to the patient's satisfaction.     Festus Barren

## 2023-03-28 NOTE — Progress Notes (Signed)
Date of Admission:  03/21/2023      ID: Carla [name] is a 62 y.o. female  Principal Problem:   Amputation stump infection (HCC) Active Problems:   Essential hypertension   GERD (gastroesophageal reflux disease)   Chronic pain syndrome   Recurrent major depressive disorder (HCC)   Hypotension   Adjustment disorder with anxiety   Paroxysmal atrial fibrillation (HCC)   COPD (chronic obstructive pulmonary disease) (HCC)   PVD (peripheral vascular disease) (HCC)   Hypokalemia   Chronic anticoagulation   Anxiety and depression   Acute blood loss anemia   Iron deficiency anemia   Pressure injury of skin   Cellulitis of left thigh   Left thigh pain    Subjective: Had further debridement today Pt in pain Crying Family at bed side  Medications:   [MAR Hold] acetaminophen  1,000 mg Oral Q8H   [MAR Hold] apixaban  5 mg Oral BID   [MAR Hold] ascorbic acid  500 mg Oral BID   [MAR Hold] atorvastatin  80 mg Oral QHS   Chlorhexidine Gluconate Cloth  6 each Topical Once   cyanocobalamin  1,000 mcg Intramuscular Daily   Followed by   Melene Muller ON 04/04/2023] vitamin B-12  1,000 mcg Oral Daily   [MAR Hold] feeding supplement  237 mL Oral BID BM   [MAR Hold] ferrous sulfate  325 mg Oral Q breakfast   folic acid  1 mg Oral Daily   [MAR Hold] isosorbide mononitrate  120 mg Oral Daily   [MAR Hold] metoprolol tartrate  12.5 mg Oral BID   midazolam  1 mg Intravenous Once   [MAR Hold] midodrine  5 mg Oral TID WC   [MAR Hold] nystatin cream   Topical BID   [MAR Hold] oxyCODONE  20 mg Oral Q12H   [MAR Hold] pantoprazole  40 mg Oral Daily   [MAR Hold] polyethylene glycol  17 g Oral Daily   [MAR Hold] pregabalin  200 mg Oral TID   [MAR Hold] senna-docusate  1 tablet Oral BID   Vitamin D (Ergocalciferol)  50,000 Units Oral Q7 days   [MAR Hold] zinc sulfate  220 mg Oral Daily    Objective: Vital signs in last 24 hours: Patient Vitals for the past 24 hrs:  BP Temp Temp src Pulse Resp SpO2  Height Weight  03/28/23 1132 103/66 -- -- -- 20 96 % -- --  03/28/23 1125 -- 98.4 F (36.9 C) -- -- -- -- -- --  03/28/23 0953 113/66 97.6 F (36.4 C) -- 73 16 95 % 5\' 2"  (1.575 m) 79.4 kg  03/28/23 0821 (!) 95/58 98.3 F (36.8 C) -- 74 18 94 % -- --  03/28/23 0340 (!) 109/57 98.5 F (36.9 C) -- 84 18 93 % -- --  03/27/23 2306 (!) 102/52 99.1 F (37.3 C) Oral 92 18 94 % -- --  03/27/23 1932 (!) 94/55 98.5 F (36.9 C) Oral 97 18 95 % -- --  03/27/23 1710 (!) 97/56 98.5 F (36.9 C) -- 88 18 97 % -- --  03/27/23 1248 (!) 84/60 98 F (36.7 C) Oral 81 18 98 % -- --     PHYSICAL EXAM:  General: Alert, cooperative, in  distress,pale Head: Normocephalic, without obvious abnormality, atraumatic. Eyes: Conjunctivae clear, anicteric sclerae. Pupils are equal ENT Nares normal. No drainage or sinus tenderness. Lips, mucosa, and tongue normal. No Thrush edentulous Neck:  symmetrical, no adenopathy, thyroid: non tender no carotid bruit and no JVD. Lungs:  Clear to auscultation bilaterally. No Wheezing or Rhonchi. No rales. Heart:Tachycardia Abdomen: Soft, non-tender,not distended. Bowel sounds normal. No masses Extremities: b/l AKA- -left has wound vac R stump  small wound almost closed Skin: No rashes or lesions. Or bruising Lymph: Cervical, supraclavicular normal. Neurologic: Grossly non-focal  Lab Results    Latest Ref Rng & Units 03/28/2023    4:25 AM 03/27/2023    4:22 AM 03/25/2023    4:07 AM  CBC  WBC 4.0 - 10.5 K/uL 8.0  8.2  6.2   Hemoglobin 12.0 - 15.0 g/dL 8.8  8.8  9.4   Hematocrit 36.0 - 46.0 % 28.2  29.5  30.9   Platelets 150 - 400 K/uL 271  260  283        Latest Ref Rng & Units 03/27/2023    4:22 AM 03/25/2023    2:59 PM 03/24/2023    4:31 AM  CMP  Glucose 70 - 99 mg/dL 295   621   BUN 8 - 23 mg/dL 14   17   Creatinine 3.08 - 1.00 mg/dL 6.57  8.46  9.62   Sodium 135 - 145 mmol/L 140   139   Potassium 3.5 - 5.1 mmol/L 4.3   4.1   Chloride 98 - 111 mmol/L 105   111    CO2 22 - 32 mmol/L 25   23   Calcium 8.9 - 10.3 mg/dL 8.6   8.4       Microbiology: Left AKA stump  surgical wound has ESBL ecoli and proteus in the wound Studies/Results: No results found.   Assessment/Plan: 62 y.o. female with a history of A-fib, CHF, PAD, COPD, HLD, hypertension, right AKA, stump infection with ESBL E. coli and Enterococcus and MRSA needing revision of the amputation site and IV antibiotics followed by oral omadacycline, last seen by me in my office in April 2024, left leg irreversible ischemia April 2024 and was in [facility]/15[facility]23-02/24/23 needing left above-the-knee amputation on 02/21/2023 She went for a vascular follow up appt on 03/21/23 and they noted the surgical site was necrotic and had purulent discharge. She was asked to get admitted   Left AKA stump infection- ESBL ecoli and proteus in the  wound Currently on meropenem Will add daptomycin as she had MRSA rt AKA She is going to need Iv antibitcs for 4 -6 weeks Will need IV antibiotic ( ertapenem and dapto on discharge)   Rt AKA stump= wound Is very small and almost closed Had chronic infection for 3 months ( MRSA, ESBL, enterococcus) and took many courses of antibiotics   Current smoker Severe PAD with stents   CAD Imdur Metoprolol atorvastatin   HTN? ? AFIB controlled- on eliquis   Anemia    Discussed the management with the patient

## 2023-03-28 NOTE — TOC Progression Note (Signed)
Transition of Care [facility]) - Progression Note    Patient Details  Name: Carla [name] MRN: [id number] Date of Birth: 12-24-1960  Transition of Care [facility] CM/SW Contact  Margarito Liner, LCSW Phone Number: 03/28/2023, 2:38 PM  Clinical Narrative: [facility] received referral from ID regarding IV abx at discharge.  Expected Discharge Plan: Home w Home Health Services Barriers to Discharge: Continued Medical Work up  Expected Discharge Plan and Services     Post Acute Care Choice: Resumption of Svcs/PTA Provider Living arrangements for the past 2 months: Single Family Home                           HH Arranged: RN, PT, OT [facility] Agency: [facility] Date HH Agency Contacted: 03/22/23   Representative spoke with at [facility] Agency: Fel[facility] Gottron   Social Determinants of Health (SDOH) Interventions SDOH Screenings   Food Insecurity: No Food Insecurity (03/22/2023)  Housing: Low Risk  (03/22/2023)  Transportation Needs: No Transportation Needs (03/22/2023)  Utilities: Not At Risk (03/22/2023)  Depression (PHQ2-9): Low Risk  (01/25/2023)  Tobacco Use: Medium Risk (03/28/2023)    Readmission Risk Interventions    03/23/2023   12:17 PM 12/06/2022    1:31 PM 07/25/2022   10:49 AM  Readmission Risk Prevention Plan  Transportation Screening Complete Complete Complete  Medication Review Oceanographer) Complete Complete Complete  PCP or Specialist appointment within 3-5 days of discharge Complete  Complete  HRI or Home Care Consult Complete  Complete  SW Recovery Care/Counseling Consult Complete Complete Not Complete  SW Consult Not Complete Comments   NA  Palliative Care Screening Not Applicable Not Applicable   Skilled Nursing Facility Not Applicable  Patient Refused

## 2023-03-28 NOTE — Consult Note (Signed)
Pharmacy Antibiotic Note  Carla [name] is a 61 y.o. female admitted on 03/21/2023 with  wound infection . Patient was sent to ED by vascular given concern for possible osteomyelitis. Patient has received multiple IV antibiotics this year including meropenem, daptomycin, and ertapenem in February and March. She has also received other oral agents including omadacycline and metronidazole this year. Historical wound cultures have grown resistant bacteria including MRSA and ESBL E. coli. Pharmacy consulted to dose daptomycin and meropenem for L AKA stump wound infection. Discussed GPC from 5/30 culture with micro lab and they were unable to isolate that organisms. 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   S/P wound vac exchange on 6/5- repeat cultures sent from OR. Noted patient is on atorvastatin 80 mg for PVD. Will consider dose reduction.   Plan: Continue meropenem 1 g IV q8H Start daptomycin 500 mg IV Q24H (~8 mg/kg AdjBW for BMI>30)  Monitor renal function, C/S, CK weekly, F/U discharge plans   Height: 5\' 2"  (157.5 cm) Weight: 79.4 kg (175 lb) IBW/kg (Calculated) : 50.1  Temp (24hrs), Avg:98.4 F (36.9 C), Min:97.6 F (36.4 C), Max:99.1 F (37.3 C)  Recent Labs  Lab 03/21/23 1627 03/22/23 0542 03/23/23 0415 03/24/23 0431 03/25/23 0407 03/25/23 1459 03/27/23 0422 03/28/23 0425  WBC  --  6.7 6.5  --  6.2  --  8.2 8.0  CREATININE  --  0.40* 0.41* 0.49  --  0.53 0.44  --   LATICACIDVEN 1.2  --   --   --   --   --   --   --      Estimated Creatinine Clearance: 70.2 mL/min (by C-G formula based on SCr of 0.44 mg/dL).    Allergies  Allergen Reactions   Gabapentin     Tremors    Bactrim [Sulfamethoxazole-Trimethoprim] Itching    Antimicrobials this admission: 5/29 Zosyn x 1  5/29 Vancomycin >> 6/1 5/29 Cefepime >> 6/1 6/01 Meropenem >>c 6/05 daptomycin >>c  Dose adjustments this admission: N/A  Microbiology  results: 5/29 BCx: NGTD 5/30 OR cultures: ESBL E. coli (S-imipenem, S-gentamicin) + Rare GPCs (micro lab unable to isolate), GPRs, GNRs  Thank you for allowing pharmacy to be a part of this patient's care.  Jani Gravel, PharmD PGY-2 Infectious Diseases Resident  03/28/2023 2:56 PM

## 2023-03-28 NOTE — Progress Notes (Signed)
[facility] Progress Note  Patient: Carla [name]    ZOX:[id number]  DOA: 03/21/2023     Date of Service: the patient was seen and examined on 03/28/2023  Chief Complaint  Patient presents with   Wound Infection   Brief hospital course: Ms. Francy Mont is a 62 year old female with history of depression, anxiety, hyperlipidemia, hypertension, tobacco use, paroxysmal atrial fibrillation on Eliquis, obesity, atherosclerosis, CAD, PAD, history of left stump infection post amputation, who presented on 03/21/2023 from vascular outpatient for due to necrosis of the left AKA stump.   Pt was admitted and started on IV antibiotics. Vascular surgery was consulted and took patient to the OR on 5/30 for debridement and wound vac placement.   6/5 s/p debridement done by vascular surgery   Assessment and Plan: * Amputation stump infection [facility]) Vascular service consulted. 5/30 - taken to OR for debridement and wound vac placement 6/1 - wound cultures growing ESBL E coli 6/5 s/p debridement and  wound vac change. --Changed IV antibiotics - Vanc >> Dapto and Cefepime >> Merrem --ID consult for d/c abx recs --Pain control per orders, adjusted with scheduled Tylenol, scheduled Oxycontin, PRN Oxy, PRN IV for breakthrough --Titrate PO pain meds in prep for d/c - wean off IV --Bowel regimen   Hypotension, resolved Likely due to pain meds and infection. Low BP's have led to pain meds needing to be held.   --Reduce metoprolol 50 >> 25 >> 12.5 mg BID --s/p midodrine 5 mg TID, DC'd on 6/5   Hypokalemia Resolved with replacement on 5/31 Monitor BMP, check Mg level   Chronic anticoagulation Eliquis held on admission -resumed   Acute blood loss anemia Hbg on admission was 12.2, trended down post-operatively to 10.4 >> 9.7 >> 9.4. No signs of active bleeding, some serosanginous drainage in wound vac canister. Anemia panel c/w iron deficiency.  --Pending B12 level 234 low, goal >400, started  vitamin B12 1000 mcg IM injection daily during hospital stay followed by oral supplement.  Started folic acid 1 mg p.o. daily and PO iron supplement w/Vit C --Monitor CBC   PVD (peripheral vascular disease)  Complicated by bilateral AKA's. Mgmt per vascular surgery   Paroxysmal atrial fibrillation  HR's controlled. Eliquis on hold, resume when okay with vascular. Continue metoprolol   COPD (chronic obstructive pulmonary disease) (HCC) Stable, not exacerbated. Continue bronchodilators PRN   Recurrent major depressive disorder (HCC) Appears no longer on Celexa   Iron deficiency anemia Start PO iron supplement.   Left thigh pain Due to AKA stump infection, with underlying chronic pain syndrome --Pain control per orders   Cellulitis of left thigh See amputation stump infection     Anxiety and depression Appears no longer on Celexa   Adjustment disorder with anxiety Appears no longer taking Celexa   Chronic pain syndrome Continue home Lyrica. Acute pain control as outlined above. Highly suspect hyperalgesia complicating pain control.   GERD (gastroesophageal reflux disease) Continue PPI   Essential hypertension Continue home Imdur, metoprolol (reduced from 50 >> 25 mg BID due to hypotension)  Pressure injury of skin I agree with the wound description as below. --Continue local wound care --Frequent repositioning --Diligent hygiene --Monitor for signs of infection Pressure Injury 03/22/23 Buttocks Left Stage 2 -  Partial thickness loss of dermis presenting as a shallow open injury with a red, pink wound bed without slough. (Active)  03/22/23 1700 (This RN was the 2nd RN at initial skin check on admission on 03/22/23.  There was  a small stage II to left buttock at that time.  This RN and admitting RN placed a foam dressing to the coccyx at that time)  Location: Buttocks  Location Orientation: Left  Staging: Stage 2 -  Partial thickness loss of dermis presenting as a  shallow open injury with a red, pink wound bed without slough.  Wound Description (Comments):   Present on Admission: Yes    Body mass index is 32.01 kg/m.  Interventions:   Pressure Injury 03/22/23 Buttocks Left Stage 2 -  Partial thickness loss of dermis presenting as a shallow open injury with a red, pink wound bed without slough. (Active)  03/22/23 1700  Location: Buttocks  Location Orientation: Left  Staging: Stage 2 -  Partial thickness loss of dermis presenting as a shallow open injury with a red, pink wound bed without slough.  Wound Description (Comments):   Present on Admission: Yes  Dressing Type Foam - Lift dressing to assess site every shift 03/28/23 0800     Diet: Regular diet DVT Prophylaxis: Therapeutic Anticoagulation with Eliquis    Advance goals of care discussion: Full code  Family Communication: family was Not present at bedside, at the time of interview.  The pt provided permission to discuss medical plan with the family. Opportunity was given to ask question and all questions were answered satisfactorily.   Disposition:  Pt is from Home, admitted with necrosis of left AKA stump, s/p debridement and wound VAC placement, still on IV antibiotics, which precludes a safe discharge. Discharge to home with home health, when cleared by vascular surgery and ID.  Subjective: No significant events overnight, patient was seen after debridement done in the OR, tolerated procedure well but complaining of severe pain 10/10.  Requesting for more pain medications.  Physical Exam: General: NAD, lying comfortably Appear in no distress, affect appropriate Eyes: PERRLA ENT: Oral Mucosa Clear, moist  Neck: no JVD,  Cardiovascular: S1 and S2 Present, no Murmur,  Respiratory: good respiratory effort, Bilateral Air entry equal and Decreased, no Crackles, no wheezes Abdomen: Bowel Sound present, Soft and no tenderness,  Skin: no rashes Extremities: RLE no Pedal edema, no calf  tenderness, s/p Left AKA, wound VAC attached. Neurologic: without any new focal findings Gait not checked due to patient safety concerns  Vitals:   03/28/23 1132 03/28/23 1200 03/28/23 1209 03/28/23 1216  BP: 103/66 (!) 163/88 (!) 163/70 (!) 146/88  Pulse:  88 80 (!) 102  Resp: 20 (!) 28 18 14   Temp:    98.4 F (36.9 C)  TempSrc:      SpO2: 96% 96% 97% 94%  Weight:      Height:        Intake/Output Summary (Last 24 hours) at 03/28/2023 1608 Last data filed at 03/28/2023 1131 Gross per 24 hour  Intake 170 ml  Output 2450 ml  Net -2280 ml   Filed Weights   03/21/23 1410 03/22/23 1006 03/28/23 0953  Weight: 81.6 kg 81.6 kg 79.4 kg    Data Reviewed: I have personally reviewed and interpreted daily labs, tele strips, imagings as discussed above. I reviewed all nursing notes, pharmacy notes, vitals, pertinent old records I have discussed plan of care as described above with RN and patient/family.  CBC: Recent Labs  Lab 03/22/23 0542 03/23/23 0415 03/25/23 0407 03/27/23 0422 03/28/23 0425  WBC 6.7 6.5 6.2 8.2 8.0  HGB 10.4* 9.7* 9.4* 8.8* 8.8*  HCT 33.8* 31.0* 30.9* 29.5* 28.2*  MCV 87.1 86.8 88.5 88.3 87.0  PLT 289 264 283 260 271   Basic Metabolic Panel: Recent Labs  Lab 03/22/23 0542 03/23/23 0415 03/24/23 0431 03/25/23 1459 03/27/23 0422  NA 140 140 139  --  140  K 3.5 3.1* 4.1  --  4.3  CL 109 112* 111  --  105  CO2 22 20* 23  --  25  GLUCOSE 104* 152* 119*  --  122*  BUN 13 10 17   --  14  CREATININE 0.40* 0.41* 0.49 0.53 0.44  CALCIUM 8.8* 8.6* 8.4*  --  8.6*  MG  --  1.8  --   --   --     Studies: No results found.  Scheduled Meds:  acetaminophen  1,000 mg Oral Q8H   apixaban  5 mg Oral BID   ascorbic acid  500 mg Oral BID   atorvastatin  40 mg Oral QHS   cyanocobalamin  1,000 mcg Intramuscular Daily   Followed by   Melene Muller ON 04/04/2023] vitamin B-12  1,000 mcg Oral Daily   feeding supplement  237 mL Oral BID BM   ferrous sulfate  325 mg Oral Q  breakfast   folic acid  1 mg Oral Daily   isosorbide mononitrate  120 mg Oral Daily   metoprolol tartrate  12.5 mg Oral BID   nystatin cream   Topical BID   oxyCODONE  20 mg Oral Q12H   pantoprazole  40 mg Oral Daily   polyethylene glycol  17 g Oral Daily   pregabalin  200 mg Oral TID   senna-docusate  1 tablet Oral BID   Vitamin D (Ergocalciferol)  50,000 Units Oral Q7 days   zinc sulfate  220 mg Oral Daily   Continuous Infusions:  DAPTOmycin (CUBICIN) 500 mg in sodium chloride 0.9 % IVPB     meropenem (MERREM) IV 1 g (03/28/23 1330)   PRN Meds: alum & mag hydroxide-simeth, bisacodyl, diphenoxylate-atropine, fentaNYL (SUBLIMAZE) injection, HYDROmorphone (DILAUDID) injection, HYDROmorphone (DILAUDID) injection, ipratropium-albuterol, ondansetron (ZOFRAN) IV, oxyCODONE  Time spent: 35 minutes  Author: Gillis Santa. MD [facility] 03/28/2023 4:08 PM  To reach On-call, see care teams to locate the attending and reach out to them via [email or web address]. If 7PM-7AM, please contact night-coverage If you still have difficulty reaching the attending provider, please page the [facility] (Director on Call) for [facility] on amion for assistance.

## 2023-03-28 NOTE — Transfer of Care (Signed)
Immediate Anesthesia Transfer of Care Note  Patient: Carla [name]  Procedure(s) Performed: LEFT STUMP WOUND VAC EXCHANGE (Left: Leg Lower)  Patient Location: PACU  Anesthesia Type:General  Level of Consciousness: drowsy and patient cooperative  Airway & Oxygen Therapy: Patient Spontanous Breathing and Patient connected to nasal cannula oxygen  Post-op Assessment: Report given to RN and Post -op Vital signs reviewed and stable  Post vital signs: Reviewed and stable  Last Vitals:  Vitals Value Taken Time  BP 103/66 03/28/23 1132  Temp 36.9 C 03/28/23 1125  Pulse 86 03/28/23 1132  Resp 16 03/28/23 1132  SpO2 96 % 03/28/23 1132  Vitals shown include unvalidated device data.  Last Pain:  Vitals:   03/28/23 0953  TempSrc:   PainSc: 10-Worst pain ever      Patients Stated Pain Goal: 2 (03/27/23 1628)  Complications: No notable events documented.

## 2023-03-28 NOTE — Anesthesia Postprocedure Evaluation (Signed)
Anesthesia Post Note  Patient: Carla [name]  Procedure(s) Performed: LEFT STUMP WOUND VAC EXCHANGE (Left: Leg Lower)  Patient location during evaluation: PACU Anesthesia Type: General Level of consciousness: awake and alert Pain management: pain level controlled Vital Signs Assessment: post-procedure vital signs reviewed and stable Respiratory status: spontaneous breathing, nonlabored ventilation, respiratory function stable and patient connected to nasal cannula oxygen Cardiovascular status: blood pressure returned to baseline and stable Postop Assessment: no apparent nausea or vomiting Anesthetic complications: no   No notable events documented.   Last Vitals:  Vitals:   03/28/23 1209 03/28/23 1216  BP: (!) 163/70 (!) 146/88  Pulse: 80 (!) 102  Resp: 18 14  Temp:  36.9 C  SpO2: 97% 94%    Last Pain:  Vitals:   03/28/23 1352  TempSrc:   PainSc: 7                  Foye Deer

## 2023-03-28 NOTE — Anesthesia Preprocedure Evaluation (Addendum)
Anesthesia Evaluation  Patient identified by MRN, date of birth, ID band Patient confused    Reviewed: Allergy & Precautions, NPO status , Patient's Chart, lab work & pertinent test results  Airway Mallampati: IV  TM Distance: >3 FB Neck ROM: full    Dental  (+) Edentulous Upper, Edentulous Lower, Dental Advidsory Given   Pulmonary shortness of breath, COPD,  COPD inhaler, Patient abstained from smoking., former smoker    + wheezing      Cardiovascular Exercise Tolerance: Poor hypertension, Pt. on medications + angina  + CAD, + Past MI, + Peripheral Vascular Disease (previous left axillary to femoral artery bypass) and +CHF  Normal cardiovascular exam Rhythm:Regular  Cath 01/2023:   Prox RCA lesion is 30% stenosed.   1st Diag lesion is 50% stenosed.   The left ventricular systolic function is normal.   LV end diastolic pressure is normal.   The left ventricular ejection fraction is greater than 65% by visual estimate.   There is no aortic valve stenosis.   Post-Cath Findings  Severe spasm of the proximal RCA existing lesion (roughly 30%) => catheter related spasm was 90% reduced to 30% after IC NTG  Mild D1 disease but otherwise no significant CAD.  Normal LVEF with no RWMA and normal LVEDP.       Neuro/Psych   Anxiety     negative neurological ROS  negative psych ROS   GI/Hepatic Neg liver ROS,GERD  Controlled,,  Endo/Other  negative endocrine ROS  Morbid obesity  Renal/GU Renal InsufficiencyRenal disease     Musculoskeletal   Abdominal  (+) + obese  Peds negative pediatric ROS (+)  Hematology  (+) Blood dyscrasia, anemia   Anesthesia Other Findings Recent right AKA with stump infection Presented with acute left leg iscemia s/p revascularization.  Past Medical History: No date: Abdominal aortic atherosclerosis (HCC)     Comment:  a. 05/2017 CTA abd/pelvis: significant atherosclerotic                dzs of the infrarenal abd Ao w/ some mural thrombus. No               aneurysm or dissection. No date: Acute focal ischemia of small intestine (HCC) 06/13/2017: Acute right-sided low back pain with right-sided sciatica 07/29/2017: AKI (acute kidney injury) (HCC) No date: Anemia No date: Anginal pain (HCC)     Comment:  a. 08/2012 Lexiscan MV: EF 54%, non ischemia/infarct. No date: Anxiety and depression No date: Baker's cyst of knee, right     Comment:  a. 07/2016 U/S: 4.1 x 1.4 x 2.9 cystic structure in R               poplitetal fossa. No date: Bell's palsy No date: CAD (coronary artery disease) No date: CHF (congestive heart failure) (HCC)     Comment:  a.) TTE 06/20/2017: EF 60-65%, no rwma, G1DD, no source               of cardiac emboli. No date: Chronic anticoagulation     Comment:  a.) apixaban No date: Chronic idiopathic constipation No date: Chronic mesenteric ischemia (HCC) No date: Chronic pain No date: COPD (chronic obstructive pulmonary disease) (HCC) No date: Dyspnea No date: Embolus of superior mesenteric artery (HCC)     Comment:  a. 05/2017 CTA Abd/pelvis: apparent thrombus or embolus               in prox SMA (70-90%); b. 05/2017 catheter directed tPA,  mechanical thrombectomy, and stenting of the SMA. 01/19/2016: Essential hypertension with goal blood pressure less than  140/90 01/19/2016: Gastroesophageal reflux disease No date: GERD (gastroesophageal reflux disease) No date: H/O colectomy 11/14/2019: History of 2019 novel coronavirus disease (COVID-19) No date: History of kidney stones No date: Hyperlipidemia No date: Hypertension No date: Morbid obesity with BMI of 40.0-44.9, adult (HCC) 06/27/2021: NSTEMI (non-ST elevated myocardial infarction) (HCC)     Comment:  a.) high sensitivity troponins trended: 893 --> 1587 -->              1748 --> 868 --> 735 ng/L 06/19/2017: Occlusive mesenteric ischemia (HCC) No date: Osteoarthritis No date:  PAD (peripheral artery disease) (HCC) No date: PAF (paroxysmal atrial fibrillation) (HCC)     Comment:  a.) CHA2DS2-VASc = 6 (sex, CHF, HTN, DVT x2, aortic               plaque). b.) rate/rhythm maintained on oral metoprolol               tartrate; chronically anticoagulated with standard dose               apixaban. No date: Palpitations No date: Peripheral edema 06/01/2016: Personal history of other malignant neoplasm of skin No date: Pneumonia No date: Pre-diabetes 06/13/2017: Primary osteoarthritis of both knees 08/06/2017: SBO (small bowel obstruction) (HCC) No date: Skin cancer of nose No date: Superior mesenteric artery thrombosis (HCC) No date: Vitamin D deficiency  Past Surgical History: 04/06/2022: AMPUTATION; Right     Comment:  Procedure: AMPUTATION ABOVE KNEE;  Surgeon: Annice Needy, MD;  Location: [facility];  Service: Vascular;                Laterality: Right; 12/09/2019: AMPUTATION TOE; Left     Comment:  Procedure: AMPUTATION TOE MPJ left;  Surgeon: Rosetta Posner, DPM;  Location: [facility];  Service: Podiatry;                Laterality: Left; No date: APPENDECTOMY 12/01/2021: APPLICATION OF WOUND VAC     Comment:  Procedure: APPLICATION OF WOUND VAC;  Surgeon: Annice Needy, MD;  Location: [facility];  Service: Vascular;; 05/03/2022: APPLICATION OF WOUND VAC; Right     Comment:  Procedure: APPLICATION OF WOUND VAC;  Surgeon: Annice Needy, MD;  Location: [facility];  Service: Vascular;                Laterality: Right; 07/23/2022: APPLICATION OF WOUND VAC; Left     Comment:  Procedure: APPLICATION OF WOUND VAC;  Surgeon: Nada Libman, MD;  Location: [facility];  Service: Vascular;                Laterality: Left;  Prevena 08/28/2022: APPLICATION OF WOUND VAC; Right     Comment:  Procedure: APPLICATION OF WOUND VAC;  Surgeon: Annice Needy, MD;  Location: [facility];  Service: Vascular;                 Laterality:  Right; 07/23/2022: AXILLARY-FEMORAL BYPASS GRAFT; Left     Comment:  Procedure: BYPASS GRAFT AXILLA-BIFEMORAL;  Surgeon:               Nada Libman, MD;  Location: [facility];  Service:               Vascular;  Laterality: Left; No date: CHOLECYSTECTOMY No date: COLON SURGERY 03/23/2022: COLONOSCOPY WITH PROPOFOL; N/A     Comment:  Procedure: COLONOSCOPY WITH PROPOFOL;  Surgeon: Wyline Mood, MD;  Location: [facility] ENDOS[facility];  Service:               Gastroenterology;  Laterality: N/A;  rectal bleed 06/21/2020: EMBOLECTOMY OR THROMBECTOMY, WITH OR WITHOUT CATHETER;  FEMOROPOPLITEAL, AORTOILIAC ARTERY, BY LEG INCISION; N/A     Comment:  Left - Decomp Fasciotomy Leg; Ant &/Or Lat Compart Only;              Midline - Angiography, Visceral, Selective Or               Supraselective (With Or Without Flush Aortogram); Left -               Revascularize, Endovasc, Open/Percut, Iliac Artery,               Unilat, Initial Vessel; W/Translum Stent, W/Angioplasty;               Right - Revascularize, Endovasc, Open/Percut, Iliac               Artery, Ea Add`L Ipsilateral; W/Translumin Stent,               W/Angioplasty; Location [facility]; 12/01/2021: ENDARTERECTOMY FEMORAL; Left     Comment:  Procedure: ENDARTERECTOMY FEMORAL;  Surgeon: Annice Needy, MD;  Location: [facility];  Service: Vascular;                Laterality: Left; 12/01/2021: ENDOVASCULAR REPAIR/STENT GRAFT; Left     Comment:  Procedure: ENDOVASCULAR REPAIR/STENT GRAFT;  Surgeon:               Annice Needy, MD;  Location: [facility];                Service: Cardiovascular;  Laterality: Left; 05/03/2022: INCISION AND DRAINAGE OF WOUND; Right     Comment:  Procedure: IRRIGATION AND DEBRIDEMENT RIGHT STUMP;                Surgeon: Annice Needy, MD;  Location: [facility];  Service:              Vascular;  Laterality: Right; 08/28/2022: IRRIGATION AND DEBRIDEMENT ABSCESS; Right     Comment:   Procedure: AKA IRRIGATION AND DEBRIDEMENT ABSCESS;                Surgeon: Annice Needy, MD;  Location: [facility];  Service:              Vascular;  Laterality: Right; 08/08/2017: LAPAROTOMY; N/A     Comment:  Procedure: EXPLORATORY LAPAROTOMY POSSIBLE BOWEL               RESECTION;  Surgeon: Leafy Ro, MD;  Location: [facility]               [facility];  Service: General;  Laterality: N/A; 05/17/2021: LEFT  HEART CATH AND CORONARY ANGIOGRAPHY; N/A     Comment:  Procedure: LEFT HEART CATH AND CORONARY ANGIOGRAPHY;                Surgeon: Alwyn Pea, MD;  Location: [facility]              [facility];  Service: Cardiovascular;  Laterality: N/A; 11/17/2019: LOWER EXTREMITY ANGIOGRAPHY; Left     Comment:  Procedure: LOWER EXTREMITY ANGIOGRAPHY;  Surgeon: Annice Needy, MD;  Location: [facility];  Service:               Cardiovascular;  Laterality: Left; 12/13/2020: LOWER EXTREMITY ANGIOGRAPHY; Right     Comment:  Procedure: LOWER EXTREMITY ANGIOGRAPHY;  Surgeon: Annice Needy, MD;  Location: [facility];  Service:               Cardiovascular;  Laterality: Right; 01/10/2021: LOWER EXTREMITY ANGIOGRAPHY; Right     Comment:  Procedure: LOWER EXTREMITY ANGIOGRAPHY;  Surgeon: Annice Needy, MD;  Location: [facility];  Service:               Cardiovascular;  Laterality: Right; 01/24/2021: LOWER EXTREMITY ANGIOGRAPHY; Right     Comment:  Procedure: LOWER EXTREMITY ANGIOGRAPHY;  Surgeon: Annice Needy, MD;  Location: [facility];  Service:               Cardiovascular;  Laterality: Right; 06/28/2021: LOWER EXTREMITY ANGIOGRAPHY; Bilateral     Comment:  Procedure: Lower Extremity Angiography;  Surgeon: Annice Needy, MD;  Location: [facility];  Service:               Cardiovascular;  Laterality: Bilateral; 11/28/2021: LOWER EXTREMITY ANGIOGRAPHY; Right     Comment:  Procedure: LOWER EXTREMITY  ANGIOGRAPHY;  Surgeon: Annice Needy, MD;  Location: [facility];  Service:               Cardiovascular;  Laterality: Right; 11/30/2021: LOWER EXTREMITY ANGIOGRAPHY; Left     Comment:  Procedure: Lower Extremity Angiography;  Surgeon: Annice Needy, MD;  Location: [facility];  Service:               Cardiovascular;  Laterality: Left; 02/22/2022: LOWER EXTREMITY ANGIOGRAPHY; Right     Comment:  Procedure: Lower Extremity Angiography;  Surgeon: Annice Needy, MD;  Location: [facility];  Service:               Cardiovascular;  Laterality: Right; 03/13/2022: LOWER EXTREMITY ANGIOGRAPHY; Right     Comment:  Procedure: Lower Extremity Angiography;  Surgeon: Annice Needy, MD;  Location: [facility];  Service:  Cardiovascular;  Laterality: Right; 03/24/2022: LOWER EXTREMITY ANGIOGRAPHY; Right     Comment:  Procedure: Lower Extremity Angiography;  Surgeon: Annice Needy, MD;  Location: [facility];  Service:               Cardiovascular;  Laterality: Right; 11/19/2019: LOWER EXTREMITY INTERVENTION; N/A     Comment:  Procedure: LOWER EXTREMITY INTERVENTION;  Surgeon: Annice Needy, MD;  Location: [facility];  Service:               Cardiovascular;  Laterality: N/A; 06/29/2021: LOWER EXTREMITY INTERVENTION; Bilateral     Comment:  Procedure: LOWER EXTREMITY INTERVENTION;  Surgeon: Annice Needy, MD;  Location: [facility];  Service:               Cardiovascular;  Laterality: Bilateral; 06/22/2017: TEE WITHOUT CARDIOVERSION; N/A     Comment:  Procedure: TRANSESOPHAGEAL ECHOCARDIOGRAM (TEE);                Surgeon: Iran Ouch, MD;  Location: [facility];                Service: Cardiovascular;  Laterality: N/A; 07/11/2018: TOTAL KNEE ARTHROPLASTY; Right     Comment:  Procedure: TOTAL KNEE ARTHROPLASTY;  Surgeon: Christena Flake, MD;  Location: [facility];  Service: Orthopedics;                Laterality: Right; No date: VAGINAL HYSTERECTOMY 06/20/2017: VISCERAL ARTERY INTERVENTION; N/A     Comment:  Procedure: Visceral Artery Intervention, possible aortic              thrombectomy;  Surgeon: Annice Needy, MD;  Location: [facility]              [facility]VAS[facility];  Service: Cardiovascular;  Laterality:               N/A; 01/28/2018: VISCERAL ARTERY INTERVENTION; N/A     Comment:  Procedure: VISCERAL ARTERY INTERVENTION;  Surgeon: Annice Needy, MD;  Location: [facility];  Service:               Cardiovascular;  Laterality: N/A;  BMI    Body Mass Index: 40.24 kg/m      Reproductive/Obstetrics negative OB ROS                             Anesthesia Physical Anesthesia Plan  ASA: 3  Anesthesia Plan: General   Post-op Pain Management: Tylenol PO (pre-op)*   Induction: Intravenous  PONV Risk Score and Plan: Ondansetron, Propofol infusion and TIVA  Airway Management Planned: Natural Airway and Simple Face Mask  Additional Equipment:   Intra-op Plan:   Post-operative Plan:   Informed Consent: I have reviewed the patients History and Physical, chart, labs and discussed the procedure including the risks, benefits and alternatives for the proposed anesthesia with the patient or authorized representative who has indicated his/her understanding and acceptance.     Dental Advisory Given  Plan Discussed  with: Anesthesiologist, CRNA and Surgeon  Anesthesia Plan Comments: (Patient consented for risks of anesthesia including but not limited to:  - adverse reactions to medications - damage to eyes, teeth, lips or other oral mucosa - nerve damage due to positioning  - sore throat or hoarseness - Damage to heart, brain, nerves, lungs, other parts of body or loss of life  Patient voiced understanding.)       Anesthesia Quick Evaluation

## 2023-03-29 DIAGNOSIS — T8740 Infection of amputation stump, unspecified extremity: Secondary | ICD-10-CM | POA: Diagnosis not present

## 2023-03-29 DIAGNOSIS — B964 Proteus (mirabilis) (morganii) as the cause of diseases classified elsewhere: Secondary | ICD-10-CM | POA: Diagnosis not present

## 2023-03-29 DIAGNOSIS — B9620 Unspecified Escherichia coli [E. coli] as the cause of diseases classified elsewhere: Secondary | ICD-10-CM | POA: Diagnosis not present

## 2023-03-29 DIAGNOSIS — T8744 Infection of amputation stump, left lower extremity: Secondary | ICD-10-CM | POA: Diagnosis not present

## 2023-03-29 DIAGNOSIS — T8743 Infection of amputation stump, right lower extremity: Secondary | ICD-10-CM | POA: Diagnosis not present

## 2023-03-29 LAB — CBC
HCT: 32.8 % — ABNORMAL LOW (ref 36.0–46.0)
Hemoglobin: 10.2 g/dL — ABNORMAL LOW (ref 12.0–15.0)
MCH: 27 pg (ref 26.0–34.0)
MCHC: 31.1 g/dL (ref 30.0–36.0)
MCV: 86.8 fL (ref 80.0–100.0)
Platelets: 304 10*3/uL (ref 150–400)
RBC: 3.78 MIL/uL — ABNORMAL LOW (ref 3.87–5.11)
RDW: 16.9 % — ABNORMAL HIGH (ref 11.5–15.5)
WBC: 7.9 10*3/uL (ref 4.0–10.5)
nRBC: 0 % (ref 0.0–0.2)

## 2023-03-29 LAB — BASIC METABOLIC PANEL
Anion gap: 11 (ref 5–15)
BUN: 13 mg/dL (ref 8–23)
CO2: 30 mmol/L (ref 22–32)
Calcium: 9.5 mg/dL (ref 8.9–10.3)
Chloride: 100 mmol/L (ref 98–111)
Creatinine, Ser: 0.4 mg/dL — ABNORMAL LOW (ref 0.44–1.00)
GFR, Estimated: >60 mL/min (ref >60)
Glucose, Bld: 125 mg/dL — ABNORMAL HIGH (ref 70–99)
Potassium: 4.3 mmol/L (ref 3.5–5.1)
Sodium: 141 mmol/L (ref 135–145)

## 2023-03-29 LAB — MAGNESIUM: Magnesium: 2.5 mg/dL — ABNORMAL HIGH (ref 1.7–2.4)

## 2023-03-29 LAB — PHOSPHORUS: Phosphorus: 4.3 mg/dL (ref 2.5–4.6)

## 2023-03-29 NOTE — Progress Notes (Signed)
[facility] Progress Note  Patient: Carla [name]    WUJ:[id number]  DOA: 03/21/2023     Date of Service: the patient was seen and examined on 03/29/2023  Chief Complaint  Patient presents with   Wound Infection   Brief hospital course: Carla [name] is a 62 year old female with history of depression, anxiety, hyperlipidemia, hypertension, tobacco use, paroxysmal atrial fibrillation on Eliquis, obesity, atherosclerosis, CAD, PAD, history of left stump infection post amputation, who presented on 03/21/2023 from vascular outpatient for due to necrosis of the left AKA stump.   Pt was admitted and started on IV antibiotics. Vascular surgery was consulted and took patient to the OR on 5/30 for debridement and wound vac placement.   6/5 s/p debridement done by vascular surgery   Assessment and Plan: * Amputation stump infection [facility]) Vascular service consulted. 5/30 - taken to OR for debridement and wound vac placement 6/1 - wound cultures growing ESBL E coli 6/5 s/p debridement and  wound vac change. --Changed IV antibiotics - Vanc >> Dapto and Cefepime >> Merrem --ID consult for d/c abx recs --Pain control per orders, adjusted with scheduled Tylenol, scheduled Oxycontin, PRN Oxy, PRN IV for breakthrough --Titrate PO pain meds in prep for d/c - wean off IV --Bowel regimen Vascular surgery planning to take her to the OR on Monday and then on Thursday for washout and VAC change   Hypotension, resolved Likely due to pain meds and infection. Low BP's have led to pain meds needing to be held.   --Reduce metoprolol 50 >> 25 >> 12.5 mg BID --s/p midodrine 5 mg TID, DC'd on 6/5   Hypokalemia Resolved with replacement on 5/31 Monitor BMP, check Mg level   Chronic anticoagulation Eliquis held on admission -resumed   Acute blood loss anemia Hbg on admission was 12.2, trended down post-operatively to 10.4 >> 9.7 >> 9.4>10.2 No signs of active bleeding, some serosanginous drainage in  wound vac canister. Anemia panel c/w iron deficiency.  B12 level 234 low, goal >400, started vitamin B12 1000 mcg IM injection daily during hospital stay followed by oral supplement.  Started folic acid 1 mg p.o. daily and PO iron supplement w/Vit C --Monitor CBC   PVD (peripheral vascular disease)  Complicated by bilateral AKA's. Mgmt per vascular surgery   Paroxysmal atrial fibrillation  HR's controlled. Eliquis on hold, resume when okay with vascular. Continue metoprolol   COPD (chronic obstructive pulmonary disease) (HCC) Stable, not exacerbated. Continue bronchodilators PRN   Recurrent major depressive disorder (HCC) Appears no longer on Celexa   Iron deficiency anemia Start PO iron supplement.   Left thigh pain Due to AKA stump infection, with underlying chronic pain syndrome --Pain control per orders   Cellulitis of left thigh See amputation stump infection     Anxiety and depression Appears no longer on Celexa   Adjustment disorder with anxiety Appears no longer taking Celexa   Chronic pain syndrome Continue home Lyrica. Acute pain control as outlined above. Highly suspect hyperalgesia complicating pain control.   GERD (gastroesophageal reflux disease) Continue PPI   Essential hypertension Continue home Imdur, metoprolol (reduced from 50 >> 25 mg BID due to hypotension)  Pressure injury of skin I agree with the wound description as below. --Continue local wound care --Frequent repositioning --Diligent hygiene --Monitor for signs of infection Pressure Injury 03/22/23 Buttocks Left Stage 2 -  Partial thickness loss of dermis presenting as a shallow open injury with a red, pink wound bed without slough. (Active)  03/22/23 1700 (This RN was the 2nd RN at initial skin check on admission on 03/22/23.  There was a small stage II to left buttock at that time.  This RN and admitting RN placed a foam dressing to the coccyx at that time)  Location: Buttocks   Location Orientation: Left  Staging: Stage 2 -  Partial thickness loss of dermis presenting as a shallow open injury with a red, pink wound bed without slough.  Wound Description (Comments):   Present on Admission: Yes    Body mass index is 32.01 kg/m.  Interventions:   Pressure Injury 03/22/23 Buttocks Left Stage 2 -  Partial thickness loss of dermis presenting as a shallow open injury with a red, pink wound bed without slough. (Active)  03/22/23 1700  Location: Buttocks  Location Orientation: Left  Staging: Stage 2 -  Partial thickness loss of dermis presenting as a shallow open injury with a red, pink wound bed without slough.  Wound Description (Comments):   Present on Admission: Yes  Dressing Type Foam - Lift dressing to assess site every shift 03/28/23 2100     Diet: Regular diet DVT Prophylaxis: Therapeutic Anticoagulation with Eliquis    Advance goals of care discussion: Full code  Family Communication: family was Not present at bedside, at the time of interview.  The pt provided permission to discuss medical plan with the family. Opportunity was given to ask question and all questions were answered satisfactorily.   Disposition:  Pt is from Home, admitted with necrosis of left AKA stump, s/p debridement and wound VAC placement, still on IV antibiotics, which precludes a safe discharge. Discharge to home with home health, when cleared by vascular surgery and ID.  Subjective: No significant events overnight, Pt still has pain 9-10/10, no any other complaints    Physical Exam: General: NAD, lying comfortably Appear in no distress, affect appropriate Eyes: PERRLA ENT: Oral Mucosa Clear, moist  Neck: no JVD,  Cardiovascular: S1 and S2 Present, no Murmur,  Respiratory: good respiratory effort, Bilateral Air entry equal and Decreased, no Crackles, no wheezes Abdomen: Bowel Sound present, Soft and no tenderness,  Skin: no rashes Extremities: RLE no Pedal edema, no calf  tenderness, s/p Left AKA, wound VAC attached. Neurologic: without any new focal findings Gait not checked due to patient safety concerns  Vitals:   03/29/23 0323 03/29/23 0924 03/29/23 1107 03/29/23 1602  BP: 116/63 123/60 102/65 99/69  Pulse: 91 91 87 89  Resp: 18 18 18 18   Temp: 99.1 F (37.3 C) 98.2 F (36.8 C) 98.6 F (37 C) 98.2 F (36.8 C)  TempSrc:      SpO2: 96% 100% 99% 96%  Weight:      Height:        Intake/Output Summary (Last 24 hours) at 03/29/2023 1617 Last data filed at 03/29/2023 0657 Gross per 24 hour  Intake 800 ml  Output 500 ml  Net 300 ml   Filed Weights   03/21/23 1410 03/22/23 1006 03/28/23 0953  Weight: 81.6 kg 81.6 kg 79.4 kg    Data Reviewed: I have personally reviewed and interpreted daily labs, tele strips, imagings as discussed above. I reviewed all nursing notes, pharmacy notes, vitals, pertinent old records I have discussed plan of care as described above with RN and patient/family.  CBC: Recent Labs  Lab 03/23/23 0415 03/25/23 0407 03/27/23 0422 03/28/23 0425 03/29/23 0645  WBC 6.5 6.2 8.2 8.0 7.9  HGB 9.7* 9.4* 8.8* 8.8* 10.2*  HCT 31.0* 30.9*  29.5* 28.2* 32.8*  MCV 86.8 88.5 88.3 87.0 86.8  PLT 264 283 260 271 304   Basic Metabolic Panel: Recent Labs  Lab 03/23/23 0415 03/24/23 0431 03/25/23 1459 03/27/23 0422 03/29/23 0645  NA 140 139  --  140 141  K 3.1* 4.1  --  4.3 4.3  CL 112* 111  --  105 100  CO2 20* 23  --  25 30  GLUCOSE 152* 119*  --  122* 125*  BUN 10 17  --  14 13  CREATININE 0.41* 0.49 0.53 0.44 0.40*  CALCIUM 8.6* 8.4*  --  8.6* 9.5  MG 1.8  --   --   --  2.5*  PHOS  --   --   --   --  4.3    Studies: No results found.  Scheduled Meds:  acetaminophen  1,000 mg Oral Q8H   apixaban  5 mg Oral BID   ascorbic acid  500 mg Oral BID   atorvastatin  40 mg Oral QHS   cyanocobalamin  1,000 mcg Intramuscular Daily   Followed by   Melene Muller ON 04/04/2023] vitamin B-12  1,000 mcg Oral Daily   feeding  supplement  237 mL Oral BID BM   ferrous sulfate  325 mg Oral Q breakfast   folic acid  1 mg Oral Daily   isosorbide mononitrate  120 mg Oral Daily   metoprolol tartrate  12.5 mg Oral BID   nystatin cream   Topical BID   oxyCODONE  20 mg Oral Q12H   pantoprazole  40 mg Oral Daily   polyethylene glycol  17 g Oral Daily   pregabalin  200 mg Oral TID   senna-docusate  1 tablet Oral BID   Vitamin D (Ergocalciferol)  50,000 Units Oral Q7 days   zinc sulfate  220 mg Oral Daily   Continuous Infusions:  DAPTOmycin (CUBICIN) 500 mg in sodium chloride 0.9 % IVPB 500 mg (03/29/23 1503)   meropenem (MERREM) IV 1 g (03/29/23 1545)   PRN Meds: alum & mag hydroxide-simeth, bisacodyl, diphenoxylate-atropine, fentaNYL (SUBLIMAZE) injection, HYDROmorphone (DILAUDID) injection, ipratropium-albuterol, ondansetron (ZOFRAN) IV, oxyCODONE  Time spent: 35 minutes  Author: Gillis Santa. MD [facility] 03/29/2023 4:17 PM  To reach On-call, see care teams to locate the attending and reach out to them via [email or web address]. If 7PM-7AM, please contact night-coverage If you still have difficulty reaching the attending provider, please page the [facility] (Director on Call) for [facility] on amion for assistance.

## 2023-03-29 NOTE — H&P (View-Only) (Signed)
Progress Note    03/29/2023 11:30 PM 1 Day Post-Op  Subjective:  Carla [name] is a 62-year-old female who presented to the vein and vascular clinic with an infected left stump status post left above-the-knee amputation 02/21/2023.  Patient returns to clinic on 03/21/23 for evaluation of the left stump.  She is found to have an infection with drainage.  Patient was sent to the emergency room for further workup.  Patient was then taken to the OR on 03/22/2023 for debridement and wound VAC placement.  Patient then had her second wound VAC change on 03/28/2023.  This morning in bed patient rests comfortably.  She states she is continues to have pain in the left stump area where the wound VAC was placed.  I explained to the patient that this is normal at this time.  Patient does have prescribed pain medicines.  No other complaints.  Vitals are remained stable.   Vitals:   03/29/23 1946 03/29/23 2038  BP: (!) 92/42 103/61  Pulse: 83 83  Resp: 18   Temp: 98.7 F (37.1 C)   SpO2: 98%    Physical Exam: Cardiac:  HX Atrial fibrillation rate in the 90's Lungs:  Clear on auscultation throughout.  Incisions:  Left stump incision dressed with wound vac. Working well Extremities:  Bilateral lower extremity AKA's. Left with infection and now wound vac  Abdomen:  Positive bowel sounds. Right lower quadrant hernia.  Neurologic: AAOX3 follows commands.  CBC    Component Value Date/Time   WBC 7.9 03/29/2023 0645   RBC 3.78 (L) 03/29/2023 0645   HGB 10.2 (L) 03/29/2023 0645   HGB 13.4 02/12/2015 1137   HCT 32.8 (L) 03/29/2023 0645   HCT 40.9 02/12/2015 1137   PLT 304 03/29/2023 0645   PLT 272 02/12/2015 1137   MCV 86.8 03/29/2023 0645   MCV 90 02/12/2015 1137   MCH 27.0 03/29/2023 0645   MCHC 31.1 03/29/2023 0645   RDW 16.9 (H) 03/29/2023 0645   RDW 14.8 (H) 02/12/2015 1137   LYMPHSABS 1.6 02/16/2023 1141   LYMPHSABS 1.7 08/27/2014 1145   MONOABS 0.4 02/16/2023 1141   MONOABS 0.4 08/27/2014  1145   EOSABS 0.3 02/16/2023 1141   EOSABS 0.0 08/27/2014 1145   BASOSABS 0.0 02/16/2023 1141   BASOSABS 0.0 08/27/2014 1145    BMET    Component Value Date/Time   NA 141 03/29/2023 0645   NA 140 02/12/2015 1137   K 4.3 03/29/2023 0645   K 2.9 (L) 02/12/2015 1137   CL 100 03/29/2023 0645   CL 107 02/12/2015 1137   CO2 30 03/29/2023 0645   CO2 27 02/12/2015 1137   GLUCOSE 125 (H) 03/29/2023 0645   GLUCOSE 120 (H) 02/12/2015 1137   BUN 13 03/29/2023 0645   BUN 15 02/12/2015 1137   CREATININE 0.40 (L) 03/29/2023 0645   CREATININE 0.78 02/12/2015 1137   CALCIUM 9.5 03/29/2023 0645   CALCIUM 9.0 02/12/2015 1137   GFRNONAA >60 03/29/2023 0645   GFRNONAA >60 02/12/2015 1137   GFRAA >60 11/23/2019 1330   GFRAA >60 02/12/2015 1137    INR    Component Value Date/Time   INR 1.6 (H) 03/21/2023 1627   INR 0.8 07/23/2012 0433     Intake/Output Summary (Last 24 hours) at 03/29/2023 2330 Last data filed at 03/29/2023 1800 Gross per 24 hour  Intake 556.6 ml  Output 400 ml  Net 156.6 ml     Assessment/Plan:  62 y.o. female is s/p  Left Lower   extremity AKA wound washout with Vac application  1 Day Post-Op   PLAN: Patient will be taken back to the operating room for first wound VAC change on Wednesday, April 02, 2023.  Will most likely entail wound washout and possibly antibiotic beads placed.  Patient is aware and agrees to the timeline.    DVT prophylaxis:  Eliquis 5 mg twice daily    Rydge Texidor R Nohemy Koop [facility] 03/29/2023 11:30 PM   

## 2023-03-29 NOTE — Progress Notes (Signed)
Progress Note    03/29/2023 11:30 PM 1 Day Post-Op  Subjective:  Carla [name] is a 62 year old female who presented to the vein and vascular clinic with an infected left stump status post left above-the-knee amputation 02/21/2023.  Patient returns to clinic on 03/21/23 for evaluation of the left stump.  She is found to have an infection with drainage.  Patient was sent to the emergency room for further workup.  Patient was then taken to the OR on 03/22/2023 for debridement and wound VAC placement.  Patient then had her second wound VAC change on 03/28/2023.  This morning in bed patient rests comfortably.  She states she is continues to have pain in the left stump area where the wound VAC was placed.  I explained to the patient that this is normal at this time.  Patient does have prescribed pain medicines.  No other complaints.  Vitals are remained stable.   Vitals:   03/29/23 1946 03/29/23 2038  BP: (!) 92/42 103/61  Pulse: 83 83  Resp: 18   Temp: 98.7 F (37.1 C)   SpO2: 98%    Physical Exam: Cardiac:  HX Atrial fibrillation rate in the 90's Lungs:  Clear on auscultation throughout.  Incisions:  Left stump incision dressed with wound vac. Working well Extremities:  Bilateral lower extremity AKA's. Left with infection and now wound vac  Abdomen:  Positive bowel sounds. Right lower quadrant hernia.  Neurologic: AAOX3 follows commands.  CBC    Component Value Date/Time   WBC 7.9 03/29/2023 0645   RBC 3.78 (L) 03/29/2023 0645   HGB 10.2 (L) 03/29/2023 0645   HGB 13.4 02/12/2015 1137   HCT 32.8 (L) 03/29/2023 0645   HCT 40.9 02/12/2015 1137   PLT 304 03/29/2023 0645   PLT 272 02/12/2015 1137   MCV 86.8 03/29/2023 0645   MCV 90 02/12/2015 1137   MCH 27.0 03/29/2023 0645   MCHC 31.1 03/29/2023 0645   RDW 16.9 (H) 03/29/2023 0645   RDW 14.8 (H) 02/12/2015 1137   LYMPHSABS 1.6 02/16/2023 1141   LYMPHSABS 1.7 08/27/2014 1145   MONOABS 0.4 02/16/2023 1141   MONOABS 0.4 08/27/2014  1145   EOSABS 0.3 02/16/2023 1141   EOSABS 0.0 08/27/2014 1145   BASOSABS 0.0 02/16/2023 1141   BASOSABS 0.0 08/27/2014 1145    BMET    Component Value Date/Time   NA 141 03/29/2023 0645   NA 140 02/12/2015 1137   K 4.3 03/29/2023 0645   K 2.9 (L) 02/12/2015 1137   CL 100 03/29/2023 0645   CL 107 02/12/2015 1137   CO2 30 03/29/2023 0645   CO2 27 02/12/2015 1137   GLUCOSE 125 (H) 03/29/2023 0645   GLUCOSE 120 (H) 02/12/2015 1137   BUN 13 03/29/2023 0645   BUN 15 02/12/2015 1137   CREATININE 0.40 (L) 03/29/2023 0645   CREATININE 0.78 02/12/2015 1137   CALCIUM 9.5 03/29/2023 0645   CALCIUM 9.0 02/12/2015 1137   GFRNONAA >60 03/29/2023 0645   GFRNONAA >60 02/12/2015 1137   GFRAA >60 11/23/2019 1330   GFRAA >60 02/12/2015 1137    INR    Component Value Date/Time   INR 1.6 (H) 03/21/2023 1627   INR 0.8 07/23/2012 0433     Intake/Output Summary (Last 24 hours) at 03/29/2023 2330 Last data filed at 03/29/2023 1800 Gross per 24 hour  Intake 556.6 ml  Output 400 ml  Net 156.6 ml     Assessment/Plan:  62 y.o. female is s/p  Left Lower  extremity AKA wound washout with Vac application  1 Day Post-Op   PLAN: Patient will be taken back to the operating room for first wound VAC change on Wednesday, April 02, 2023.  Will most likely entail wound washout and possibly antibiotic beads placed.  Patient is aware and agrees to the timeline.    DVT prophylaxis:  Eliquis 5 mg twice daily    [name] [facility] 03/29/2023 11:30 PM

## 2023-03-29 NOTE — Progress Notes (Signed)
Date of Admission:  03/21/2023      ID: SILAH KERCHNER is a 62 y.o. female  Principal Problem:   Amputation stump infection (HCC) Active Problems:   Essential hypertension   GERD (gastroesophageal reflux disease)   Chronic pain syndrome   Recurrent major depressive disorder (HCC)   Hypotension   Adjustment disorder with anxiety   Paroxysmal atrial fibrillation (HCC)   COPD (chronic obstructive pulmonary disease) (HCC)   PVD (peripheral vascular disease) (HCC)   Hypokalemia   Chronic anticoagulation   Anxiety and depression   Acute blood loss anemia   Iron deficiency anemia   Pressure injury of skin   Cellulitis of left thigh   Left thigh pain    Subjective:  Pt is doing okay today  Medications:   acetaminophen  1,000 mg Oral Q8H   apixaban  5 mg Oral BID   ascorbic acid  500 mg Oral BID   atorvastatin  40 mg Oral QHS   cyanocobalamin  1,000 mcg Intramuscular Daily   Followed by   Melene Muller ON 04/04/2023] vitamin B-12  1,000 mcg Oral Daily   feeding supplement  237 mL Oral BID BM   ferrous sulfate  325 mg Oral Q breakfast   folic acid  1 mg Oral Daily   isosorbide mononitrate  120 mg Oral Daily   metoprolol tartrate  12.5 mg Oral BID   nystatin cream   Topical BID   oxyCODONE  20 mg Oral Q12H   pantoprazole  40 mg Oral Daily   polyethylene glycol  17 g Oral Daily   pregabalin  200 mg Oral TID   senna-docusate  1 tablet Oral BID   Vitamin D (Ergocalciferol)  50,000 Units Oral Q7 days   zinc sulfate  220 mg Oral Daily    Objective: Vital signs in last 24 hours: Patient Vitals for the past 24 hrs:  BP Temp Pulse Resp SpO2  03/29/23 1602 99/69 98.2 F (36.8 C) 89 18 96 %  03/29/23 1107 102/65 98.6 F (37 C) 87 18 99 %  03/29/23 0924 123/60 98.2 F (36.8 C) 91 18 100 %  03/29/23 0323 116/63 99.1 F (37.3 C) 91 18 96 %  03/28/23 2340 (!) 112/57 99.1 F (37.3 C) 79 18 94 %     PHYSICAL EXAM:  General: Alert, cooperative, no  distress,pale Lungs: Clear to  auscultation bilaterally. No Wheezing or Rhonchi. No rales. Heart:s1s2 Abdomen: Soft, non-tender,not distended. Bowel sounds normal. No masses Extremities: b/l AKA- -left has wound vac R stump  small wound almost closed Skin: No rashes or lesions. Or bruising Lymph: Cervical, supraclavicular normal. Neurologic: Grossly non-focal  Lab Results    Latest Ref Rng & Units 03/29/2023    6:45 AM 03/28/2023    4:25 AM 03/27/2023    4:22 AM  CBC  WBC 4.0 - 10.5 K/uL 7.9  8.0  8.2   Hemoglobin 12.0 - 15.0 g/dL 56.2  8.8  8.8   Hematocrit 36.0 - 46.0 % 32.8  28.2  29.5   Platelets 150 - 400 K/uL 304  271  260        Latest Ref Rng & Units 03/29/2023    6:45 AM 03/27/2023    4:22 AM 03/25/2023    2:59 PM  CMP  Glucose 70 - 99 mg/dL 130  865    BUN 8 - 23 mg/dL 13  14    Creatinine 7.84 - 1.00 mg/dL 6.96  2.95  2.84  Sodium 135 - 145 mmol/L 141  140    Potassium 3.5 - 5.1 mmol/L 4.3  4.3    Chloride 98 - 111 mmol/L 100  105    CO2 22 - 32 mmol/L 30  25    Calcium 8.9 - 10.3 mg/dL 9.5  8.6        Microbiology: Left AKA stump  surgical wound has ESBL ecoli and proteus in the wound Studies/Results: No results found.   Assessment/Plan: 62 y.o. female with a history of A-fib, CHF, PAD, COPD, HLD, hypertension, right AKA, stump infection with ESBL E. coli and Enterococcus and MRSA needing revision of the amputation site and IV antibiotics followed by oral omadacycline, last seen by me in my office in April 2024, left leg irreversible ischemia April 2024 and was in [facility] 02/15/23-02/24/23 needing left above-the-knee amputation on 02/21/2023 She went for a vascular follow up appt on 03/21/23 and they noted the surgical site was necrotic and had purulent discharge. She was asked to get admitted   Left AKA stump infection- ESBL ecoli and proteus in the  wound Currently on meropenem  added daptomycin as she had MRSA rt AKA wound And now the repeat culture from the left stump has staph aureus She is going  to need Iv antibitcs for 4 -6 weeks Will need IV antibiotic ( ertapenem and dapto on discharge)   Rt AKA stump= wound Is very small and almost closed Had chronic infection for 3 months ( MRSA, ESBL, enterococcus) and took many courses of antibiotics   Current smoker Severe PAD with stents   CAD Imdur Metoprolol atorvastatin   HTN? ? AFIB controlled- on eliquis   Anemia    Discussed the management with the patient Dr.Vu covering [facility] 6/7-04/01/23- by phone- call if needed

## 2023-03-29 NOTE — TOC Progression Note (Addendum)
Transition of Care [facility]) - Progression Note    Patient Details  Name: Carla [name] MRN: [id number] Date of Birth: Feb 04, 1961  Transition of Care [facility]) CM/SW Contact  Margarito Liner, LCSW Phone Number: 03/29/2023, 10:20 AM  Clinical Narrative:   [facility] confirmed they can still provide home health services at discharge. They are aware of plan for IV abx.  10:43 am: Per vascular, plan to return to OR on Monday for another vac change. [facility] and [facility] are aware.  Expected Discharge Plan: Home w Home Health Services Barriers to Discharge: Continued Medical Work up  Expected Discharge Plan and Services     Post Acute Care Choice: Resumption of Svcs/PTA Provider Living arrangements for the past 2 months: Single Family Home                           HH Arranged: RN, PT, OT [facility] Agency: [facility] Date HH Agency Contacted: 03/22/23   Representative spoke with at [facility] Agency: Feliberto Gottron   Social Determinants of Health (SDOH) Interventions SDOH Screenings   Food Insecurity: No Food Insecurity (03/22/2023)  Housing: Low Risk  (03/22/2023)  Transportation Needs: No Transportation Needs (03/22/2023)  Utilities: Not At Risk (03/22/2023)  Depression (PHQ2-9): Low Risk  (01/25/2023)  Tobacco Use: Medium Risk (03/28/2023)    Readmission Risk Interventions    03/23/2023   12:17 PM 12/06/2022    1:31 PM 07/25/2022   10:49 AM  Readmission Risk Prevention Plan  Transportation Screening Complete Complete Complete  Medication Review Oceanographer) Complete Complete Complete  PCP or Specialist appointment within 3-5 days of discharge Complete  Complete  HRI or Home Care Consult Complete  Complete  SW Recovery Care/Counseling Consult Complete Complete Not Complete  SW Consult Not Complete Comments   NA  Palliative Care Screening Not Applicable Not Applicable   Skilled Nursing Facility Not Applicable  Patient Refused

## 2023-03-29 NOTE — Progress Notes (Signed)
   03/29/23 1000  Spiritual Encounters  Type of Visit Initial  Care provided to: Patient  Referral source Nurse (RN/NT/LPN)  Reason for visit Urgent spiritual support  OnCall Visit Yes  Spiritual Framework  Presenting Themes Meaning/purpose/sources of inspiration;Goals in life/care  Strengths Family  Needs/Challenges/Barriers Grief  Patient Stress Factors Major life changes;Health changes  Interventions  Spiritual Care Interventions Made Established relationship of care and support;Compassionate presence;Reflective listening;Normalization of emotions;Explored ethical dilemma  Spiritual Care Plan  Spiritual Care Issues Still Outstanding Chaplain will continue to follow   Receive phone call to see patient. Patient is dealing with major life changes and past loss of love ones'. Advise patient that she has so much more to live for with the multitude of grandchildren and great grandchildren she has. That she has family support even in the mist of her loss. Patient felt like giving up because of pain and thinking about the passing of her love ones.

## 2023-03-29 NOTE — Plan of Care (Signed)

## 2023-03-30 DIAGNOSIS — T8740 Infection of amputation stump, unspecified extremity: Secondary | ICD-10-CM | POA: Diagnosis not present

## 2023-03-30 LAB — CBC
HCT: 28.2 % — ABNORMAL LOW (ref 36.0–46.0)
Hemoglobin: 8.6 g/dL — ABNORMAL LOW (ref 12.0–15.0)
MCH: 27 pg (ref 26.0–34.0)
MCHC: 30.5 g/dL (ref 30.0–36.0)
MCV: 88.4 fL (ref 80.0–100.0)
Platelets: 268 10*3/uL (ref 150–400)
RBC: 3.19 MIL/uL — ABNORMAL LOW (ref 3.87–5.11)
RDW: 16.4 % — ABNORMAL HIGH (ref 11.5–15.5)
WBC: 6.5 10*3/uL (ref 4.0–10.5)
nRBC: 0 % (ref 0.0–0.2)

## 2023-03-30 LAB — BASIC METABOLIC PANEL
Anion gap: 9 (ref 5–15)
BUN: 17 mg/dL (ref 8–23)
CO2: 29 mmol/L (ref 22–32)
Calcium: 8.6 mg/dL — ABNORMAL LOW (ref 8.9–10.3)
Chloride: 102 mmol/L (ref 98–111)
Creatinine, Ser: 0.37 mg/dL — ABNORMAL LOW (ref 0.44–1.00)
GFR, Estimated: >60 mL/min (ref >60)
Glucose, Bld: 113 mg/dL — ABNORMAL HIGH (ref 70–99)
Potassium: 4.3 mmol/L (ref 3.5–5.1)
Sodium: 140 mmol/L (ref 135–145)

## 2023-03-30 LAB — MAGNESIUM: Magnesium: 2.4 mg/dL (ref 1.7–2.4)

## 2023-03-30 LAB — PHOSPHORUS: Phosphorus: 4.2 mg/dL (ref 2.5–4.6)

## 2023-03-30 LAB — AEROBIC/ANAEROBIC CULTURE W GRAM STAIN (SURGICAL/DEEP WOUND)

## 2023-03-30 MED ORDER — HYDROMORPHONE HCL 1 MG/ML IJ SOLN
0.7500 mg | INTRAMUSCULAR | Status: DC | PRN
  Administered 2023-03-30 – 2023-04-10 (×36): 0.75 mg via INTRAVENOUS
  Filled 2023-03-30 (×37): qty 1

## 2023-03-30 MED ORDER — HYDROMORPHONE HCL 1 MG/ML IJ SOLN
0.5000 mg | Freq: Once | INTRAMUSCULAR | Status: AC | PRN
  Administered 2023-03-30: 0.5 mg via INTRAVENOUS
  Filled 2023-03-30: qty 1

## 2023-03-30 MED ORDER — OXYCODONE HCL ER 20 MG PO T12A
30.0000 mg | EXTENDED_RELEASE_TABLET | Freq: Two times a day (BID) | ORAL | Status: DC
  Administered 2023-03-30 – 2023-04-09 (×20): 30 mg via ORAL
  Filled 2023-03-30 (×2): qty 1
  Filled 2023-03-30: qty 2
  Filled 2023-03-30: qty 1
  Filled 2023-03-30 (×3): qty 2
  Filled 2023-03-30 (×2): qty 1
  Filled 2023-03-30: qty 2
  Filled 2023-03-30 (×5): qty 1
  Filled 2023-03-30 (×2): qty 2
  Filled 2023-03-30 (×2): qty 1
  Filled 2023-03-30: qty 2

## 2023-03-30 NOTE — Progress Notes (Signed)
[facility] Progress Note  Patient: Carla [name]    EXB:[id number]  DOA: 03/21/2023     Date of Service: the patient was seen and examined on 03/30/2023  Chief Complaint  Patient presents with   Wound Infection   Brief hospital course: Ms. Germain Benyo is a 62 year old female with history of depression, anxiety, hyperlipidemia, hypertension, tobacco use, paroxysmal atrial fibrillation on Eliquis, obesity, atherosclerosis, CAD, PAD, history of left stump infection post amputation, who presented on 03/21/2023 from vascular outpatient for due to necrosis of the left AKA stump.   Pt was admitted and started on IV antibiotics. Vascular surgery was consulted and took patient to the OR on 5/30 for debridement and wound vac placement.   6/5 s/p debridement done by vascular surgery   Assessment and Plan: * Amputation stump infection [facility]) Vascular service consulted. 5/30 - taken to OR for debridement and wound vac placement 6/1 - wound cultures growing ESBL E coli 6/5 s/p debridement and  wound vac change. --Changed IV antibiotics - Vanc >> Dapto and Cefepime >> Merrem --ID consult for d/c abx recs --Pain control per orders, adjusted with scheduled Tylenol, scheduled Oxycontin, PRN Oxy, PRN IV for breakthrough --Titrate PO pain meds in prep for d/c - wean off IV --Bowel regimen Vascular surgery planning to take her to the OR on Monday and then on Thursday for washout and VAC change   Hypotension, resolved Likely due to pain meds and infection. Low BP's have led to pain meds needing to be held.   --Reduce metoprolol 50 >> 25 >> 12.5 mg BID --s/p midodrine 5 mg TID, DC'd on 6/5   Hypokalemia Resolved with replacement on 5/31 Monitor BMP, check Mg level   Chronic anticoagulation Eliquis held on admission -resumed   Acute blood loss anemia Hbg on admission was 12.2, trended down post-operatively to 10.4 >> 9.7 >> 9.4>10.2 No signs of active bleeding, some serosanginous drainage in  wound vac canister. Anemia panel c/w iron deficiency.  B12 level 234 low, goal >400, started vitamin B12 1000 mcg IM injection daily during hospital stay followed by oral supplement.   Started folic acid 1 mg p.o. daily and PO iron supplement w/Vit C --Monitor CBC   PVD (peripheral vascular disease)  Complicated by bilateral AKA's. Mgmt per vascular surgery   Paroxysmal atrial fibrillation  HR's controlled. Eliquis on hold, resume when okay with vascular. Continue metoprolol   COPD (chronic obstructive pulmonary disease) (HCC) Stable, not exacerbated. Continue bronchodilators PRN   Recurrent major depressive disorder (HCC) Appears no longer on Celexa   Iron deficiency anemia Start PO iron supplement.   Left thigh pain Due to AKA stump infection, with underlying chronic pain syndrome --Pain control per orders   Cellulitis of left thigh See amputation stump infection     Anxiety and depression Appears no longer on Celexa   Adjustment disorder with anxiety Appears no longer taking Celexa   Chronic pain syndrome Continue home Lyrica. Acute pain control as outlined above. Highly suspect hyperalgesia complicating pain control.   GERD (gastroesophageal reflux disease) Continue PPI   Essential hypertension Continue home Imdur, metoprolol (reduced from 50 >> 25 mg BID due to hypotension)  Pressure injury of skin I agree with the wound description as below. --Continue local wound care --Frequent repositioning --Diligent hygiene --Monitor for signs of infection Pressure Injury 03/22/23 Buttocks Left Stage 2 -  Partial thickness loss of dermis presenting as a shallow open injury with a red, pink wound bed without slough. (Active)  03/22/23 1700 (This RN was the 2nd RN at initial skin check on admission on 03/22/23.  There was a small stage II to left buttock at that time.  This RN and admitting RN placed a foam dressing to the coccyx at that time)  Location: Buttocks   Location Orientation: Left  Staging: Stage 2 -  Partial thickness loss of dermis presenting as a shallow open injury with a red, pink wound bed without slough.  Wound Description (Comments):   Present on Admission: Yes    Body mass index is 32.01 kg/m.  Interventions:   Pressure Injury 03/22/23 Buttocks Left Stage 2 -  Partial thickness loss of dermis presenting as a shallow open injury with a red, pink wound bed without slough. (Active)  03/22/23 1700  Location: Buttocks  Location Orientation: Left  Staging: Stage 2 -  Partial thickness loss of dermis presenting as a shallow open injury with a red, pink wound bed without slough.  Wound Description (Comments):   Present on Admission: Yes  Dressing Type Foam - Lift dressing to assess site every shift 03/29/23 2100     Diet: Regular diet DVT Prophylaxis: Therapeutic Anticoagulation with Eliquis    Advance goals of care discussion: Full code  Family Communication: family was Not present at bedside, at the time of interview.  The pt provided permission to discuss medical plan with the family. Opportunity was given to ask question and all questions were answered satisfactorily.   Disposition:  Pt is from Home, admitted with necrosis of left AKA stump, s/p debridement and wound VAC placement, still on IV antibiotics, which precludes a safe discharge. Discharge to home with home health, when cleared by vascular surgery and ID.  Subjective: No significant events overnight, patient still has significant pain 10/10, requesting to increase pain medications.  No any other complaints    Physical Exam: General: NAD, lying comfortably Appear in no distress, affect appropriate Eyes: PERRLA ENT: Oral Mucosa Clear, moist  Neck: no JVD,  Cardiovascular: S1 and S2 Present, no Murmur,  Respiratory: good respiratory effort, Bilateral Air entry equal and Decreased, no Crackles, no wheezes Abdomen: Bowel Sound present, Soft and no tenderness,   Skin: no rashes Extremities: RLE no Pedal edema, no calf tenderness, s/p Left AKA, wound VAC attached. Neurologic: without any new focal findings Gait not checked due to patient safety concerns  Vitals:   03/30/23 0137 03/30/23 0519 03/30/23 0929 03/30/23 1317  BP: (!) 109/57 96/64 (!) 112/44 97/61  Pulse: 92 86 87 90  Resp:  20 18 19   Temp:  98.5 F (36.9 C) (!) 97.4 F (36.3 C) 98.2 F (36.8 C)  TempSrc:    Oral  SpO2:  96% 99% 96%  Weight:      Height:        Intake/Output Summary (Last 24 hours) at 03/30/2023 1506 Last data filed at 03/30/2023 1322 Gross per 24 hour  Intake 156.6 ml  Output 1950 ml  Net -1793.4 ml   Filed Weights   03/21/23 1410 03/22/23 1006 03/28/23 0953  Weight: 81.6 kg 81.6 kg 79.4 kg    Data Reviewed: I have personally reviewed and interpreted daily labs, tele strips, imagings as discussed above. I reviewed all nursing notes, pharmacy notes, vitals, pertinent old records I have discussed plan of care as described above with RN and patient/family.  CBC: Recent Labs  Lab 03/25/23 0407 03/27/23 0422 03/28/23 0425 03/29/23 0645 03/30/23 0404  WBC 6.2 8.2 8.0 7.9 6.5  HGB 9.4* 8.8*  8.8* 10.2* 8.6*  HCT 30.9* 29.5* 28.2* 32.8* 28.2*  MCV 88.5 88.3 87.0 86.8 88.4  PLT 283 260 271 304 268   Basic Metabolic Panel: Recent Labs  Lab 03/24/23 0431 03/25/23 1459 03/27/23 0422 03/29/23 0645 03/30/23 0404  NA 139  --  140 141 140  K 4.1  --  4.3 4.3 4.3  CL 111  --  105 100 102  CO2 23  --  25 30 29   GLUCOSE 119*  --  122* 125* 113*  BUN 17  --  14 13 17   CREATININE 0.49 0.53 0.44 0.40* 0.37*  CALCIUM 8.4*  --  8.6* 9.5 8.6*  MG  --   --   --  2.5* 2.4  PHOS  --   --   --  4.3 4.2    Studies: No results found.  Scheduled Meds:  acetaminophen  1,000 mg Oral Q8H   apixaban  5 mg Oral BID   ascorbic acid  500 mg Oral BID   atorvastatin  40 mg Oral QHS   cyanocobalamin  1,000 mcg Intramuscular Daily   Followed by   Melene Muller ON  04/04/2023] vitamin B-12  1,000 mcg Oral Daily   feeding supplement  237 mL Oral BID BM   ferrous sulfate  325 mg Oral Q breakfast   folic acid  1 mg Oral Daily   isosorbide mononitrate  120 mg Oral Daily   metoprolol tartrate  12.5 mg Oral BID   nystatin cream   Topical BID   oxyCODONE  20 mg Oral Q12H   pantoprazole  40 mg Oral Daily   polyethylene glycol  17 g Oral Daily   pregabalin  200 mg Oral TID   senna-docusate  1 tablet Oral BID   Vitamin D (Ergocalciferol)  50,000 Units Oral Q7 days   zinc sulfate  220 mg Oral Daily   Continuous Infusions:  DAPTOmycin (CUBICIN) 500 mg in sodium chloride 0.9 % IVPB 500 mg (03/30/23 1448)   meropenem (MERREM) IV 1 g (03/30/23 1312)   PRN Meds: alum & mag hydroxide-simeth, bisacodyl, diphenoxylate-atropine, fentaNYL (SUBLIMAZE) injection, HYDROmorphone (DILAUDID) injection, ipratropium-albuterol, ondansetron (ZOFRAN) IV, oxyCODONE  Time spent: 35 minutes  Author: Gillis Santa. MD [facility] 03/30/2023 3:06 PM  To reach On-call, see care teams to locate the attending and reach out to them via [email or web address]. If 7PM-7AM, please contact night-coverage If you still have difficulty reaching the attending provider, please page the [facility] (Director on Call) for [facility] on amion for assistance.

## 2023-03-30 NOTE — Consult Note (Signed)
Pharmacy Antibiotic Note  Carla [name] is a 62 y.o. female admitted on 03/21/2023 with  wound infection . Patient was sent to ED by vascular given concern for possible osteomyelitis. Patient has received multiple IV antibiotics this year including meropenem, daptomycin, and ertapenem in February and March. She has also received other oral agents including omadacycline and metronidazole this year. Historical wound cultures have grown resistant bacteria including MRSA and ESBL E. coli. Pharmacy consulted to dose daptomycin and meropenem for L AKA stump wound infection. Discussed GPC from 5/30 culture with micro lab and they were unable to isolate that organisms. 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   S/P wound vac exchange on 6/5- repeat cultures sent from OR. Noted patient is on atorvastatin 80 mg for PVD. Reduced to atorvastatin 40mg .   -  baseline CK 6/5 = 14 - 6/5 wound culture with S aureus isolated, susceptibilities pending. Also proteus mirabilis which was seen in previous culture  Plan: Continue meropenem 1 g IV q8H Continue daptomycin 500 mg IV Q24H (~8 mg/kg AdjBW for BMI>30)  Monitor renal function, C/S, CK weekly, F/U discharge plans  Await OPAT  Height: 5\' 2"  (157.5 cm) Weight: 79.4 kg (175 lb) IBW/kg (Calculated) : 50.1  Temp (24hrs), Avg:98.2 F (36.8 C), Min:97.4 F (36.3 C), Max:98.7 F (37.1 C)  Recent Labs  Lab 03/24/23 0431 03/25/23 0407 03/25/23 1459 03/27/23 0422 03/28/23 0425 03/29/23 0645 03/30/23 0404  WBC  --  6.2  --  8.2 8.0 7.9 6.5  CREATININE 0.49  --  0.53 0.44  --  0.40* 0.37*     Estimated Creatinine Clearance: 70.2 mL/min (A) (by C-G formula based on SCr of 0.37 mg/dL (L)).    Allergies  Allergen Reactions   Gabapentin     Tremors    Bactrim [Sulfamethoxazole-Trimethoprim] Itching    Antimicrobials this admission: 5/29 Zosyn x 1  5/29 Vancomycin >> 6/1 5/29 Cefepime >> 6/1 6/01  Meropenem >>c 6/05 daptomycin >>c  Dose adjustments this admission: N/A  Microbiology results: 5/29 BCx: NGTD 5/30 OR cultures: ESBL E. coli (S-imipenem, S-gentamicin) + Rare GPCs (micro lab unable to isolate), GPRs, GNRs 6/5 tissue cx (during VAC change): S aureus, P mirabilis  Thank you for allowing pharmacy to be a part of this patient's care.  Juliette Alcide, PharmD, BCPS, BCIDP Work Cell: [phone] 03/30/2023 2:21 PM

## 2023-03-31 DIAGNOSIS — T8740 Infection of amputation stump, unspecified extremity: Secondary | ICD-10-CM | POA: Diagnosis not present

## 2023-03-31 LAB — CBC
HCT: 28.8 % — ABNORMAL LOW (ref 36.0–46.0)
Hemoglobin: 8.7 g/dL — ABNORMAL LOW (ref 12.0–15.0)
MCH: 27 pg (ref 26.0–34.0)
MCHC: 30.2 g/dL (ref 30.0–36.0)
MCV: 89.4 fL (ref 80.0–100.0)
Platelets: 278 10*3/uL (ref 150–400)
RBC: 3.22 MIL/uL — ABNORMAL LOW (ref 3.87–5.11)
RDW: 16.4 % — ABNORMAL HIGH (ref 11.5–15.5)
WBC: 5 10*3/uL (ref 4.0–10.5)
nRBC: 0 % (ref 0.0–0.2)

## 2023-03-31 LAB — BASIC METABOLIC PANEL
Anion gap: 10 (ref 5–15)
BUN: 17 mg/dL (ref 8–23)
CO2: 29 mmol/L (ref 22–32)
Calcium: 8.8 mg/dL — ABNORMAL LOW (ref 8.9–10.3)
Chloride: 99 mmol/L (ref 98–111)
Creatinine, Ser: 0.44 mg/dL (ref 0.44–1.00)
GFR, Estimated: >60 mL/min (ref >60)
Glucose, Bld: 120 mg/dL — ABNORMAL HIGH (ref 70–99)
Potassium: 4.7 mmol/L (ref 3.5–5.1)
Sodium: 138 mmol/L (ref 135–145)

## 2023-03-31 LAB — PHOSPHORUS: Phosphorus: 5 mg/dL — ABNORMAL HIGH (ref 2.5–4.6)

## 2023-03-31 LAB — AEROBIC/ANAEROBIC CULTURE W GRAM STAIN (SURGICAL/DEEP WOUND)

## 2023-03-31 LAB — MAGNESIUM: Magnesium: 2.5 mg/dL — ABNORMAL HIGH (ref 1.7–2.4)

## 2023-03-31 NOTE — Plan of Care (Signed)
  Problem: Clinical Measurements: Goal: Respiratory complications will improve Outcome: Progressing   Problem: Nutrition: Goal: Adequate nutrition will be maintained Outcome: Progressing   Problem: Coping: Goal: Level of anxiety will decrease Outcome: Progressing   Problem: Elimination: Goal: Will not experience complications related to bowel motility Outcome: Progressing   Problem: Safety: Goal: Ability to remain free from injury will improve Outcome: Progressing   Problem: Skin Integrity: Goal: Risk for impaired skin integrity will decrease Outcome: Progressing   Problem: Education: Goal: Knowledge of General Education information will improve Description: Including pain rating scale, medication(s)/side effects and non-pharmacologic comfort measures Outcome: Not Progressing   Problem: Health Behavior/Discharge Planning: Goal: Ability to manage health-related needs will improve Outcome: Not Progressing

## 2023-03-31 NOTE — Progress Notes (Addendum)
Progress Note   Patient: Carla [name] ZOX:[id number] DOB: [birth year] DOA: 03/21/2023     10 DOS: the patient was seen and examined on 03/31/2023   Brief hospital course: [name] is a 62 year old female with history of depression, anxiety, hyperlipidemia, hypertension, tobacco use, paroxysmal atrial fibrillation on Eliquis, obesity, atherosclerosis, CAD, PAD, history of left stump infection post amputation, who presented on 03/21/2023 from vascular outpatient for due to necrosis of the left AKA stump.   Pt was admitted and started on IV antibiotics. Vascular surgery was consulted and took patient to the OR on 5/30 for debridement and wound vac placement.   6/5 s/p debridement done by vascular surgery   Assessment and Plan:  Amputation stump infection/cellulitis of the left thigh with abscess [facility]) Vascular service consulted. 5/30 - taken to OR for debridement and wound vac placement 6/1 - wound cultures growing ESBL E coli 6/5 s/p debridement and  wound vac change. --Changed IV antibiotics - Vanc >> Dapto and Cefepime >> Merrem --ID consult for d/c abx recs noted patient will need 4-6 weeks of iv antibiotics with daptomycin and ertapenem. --Pain control  adjusted with scheduled Tylenol, scheduled Oxycontin, PRN Oxy, PRN IV  dilaudid for breakthrough --Titrate PO pain meds in prep for d/c - wean off IV --Bowel regimen Vascular surgery planning to take her to the OR on Wednesday 6/12 for washout and VAC change     Hypotension, resolved Likely due to pain meds and infection. Low BP's have led to pain meds needing to be held.   --Reduce metoprolol 50 >> 25 >> 12.5 mg BID --s/p midodrine 5 mg TID, DC'd on 6/5   Hypokalemia Resolved with replacement on 5/31 Monitor BMP, check Mg level   Chronic anticoagulation Eliquis held on admission -resumed   Acute blood loss anemia Hbg on admission was 12.2, trended down post-operatively to 10.4 >> 9.7 >> 9.4>10.2 No signs of active  bleeding, some serosanginous drainage in wound vac canister. Anemia panel c/w iron deficiency.  B12 level 234 low, goal >400, started vitamin B12 1000 mcg IM injection daily during hospital stay followed by oral supplement.   continue folic acid 1 mg p.o. daily and PO iron supplement w/Vit C --Monitor CBC   PVD (peripheral vascular disease)  Complicated by bilateral AKA's. Mgmt per vascular surgery   Paroxysmal atrial fibrillation  HR's controlled. Eliquis on hold, resume when okay with vascular. Continue metoprolol   COPD (chronic obstructive pulmonary disease) (HCC) Stable, not exacerbated. Continue bronchodilators PRN   Recurrent major depressive disorder (HCC) Appears no longer on Celexa   Iron deficiency anemia Continue PO iron supplement.   Left thigh pain Due to AKA stump infection, with underlying chronic pain syndrome --Pain control per orders       Anxiety and depression Appears no longer on Celexa   Adjustment disorder with anxiety Appears no longer taking Celexa   Chronic pain syndrome Continue home Lyrica. Acute pain control as outlined above. Highly suspect hyperalgesia complicating pain control.   GERD (gastroesophageal reflux disease) Continue PPI   Essential hypertension Continue home Imdur, metoprolol (reduced from 50 >> 25 mg BID due to hypotension)   Pressure injury of skin I agree with the wound description as below. --Continue local wound care --Frequent repositioning --Diligent hygiene --Monitor for signs of infection    Subjective: Seen at the bedside patient was tearful due to lower extremity pain and she wants more pain medication.the pain  is likely due to phantom limb pain  Physical Exam: Vitals:   03/31/23 0459 03/31/23 0747 03/31/23 1153 03/31/23 1614  BP: 98/61 (!) 101/58 (!) 81/34 (!) 101/58  Pulse: 70 74 87 94  Resp: 20 16 16 16   Temp: 98.4 F (36.9 C) 98.4 F (36.9 C) 98.7 F (37.1 C) 98.3 F (36.8 C)  TempSrc:  Oral     SpO2: 99% 98% 96% 95%  Weight:      Height:      Physical Exam: General: NAD, lying comfortably Appear in no distress, affect appropriate Eyes: PERRLA ENT: Oral Mucosa Clear, moist  Neck: no JVD,  Cardiovascular: S1 and S2 Present, no Murmur,  Respiratory: good respiratory effort, Bilateral Air entry equal and Decreased, no Crackles, no wheezes Abdomen: Bowel Sound present, Soft and no tenderness,  Skin: no rashes Extremities: RLE amputation, no calf tenderness, s/p Left AKA, wound VAC attached. Neurologic: without any new focal findings Gait not checked due to patient safety concerns  Data Reviewed:     Family Communication: Daugthter Carla [name] not available on phone or at the bedside.  Disposition: Status is: Inpatient Remains inpatient appropriate because: on multiple  iv antibiotics  Planned Discharge Destination: Rehab    Time spent: 37 minutes  Author: Florencia Reasons, MD 03/31/2023 4:29 PM  For on call review [email or web address].

## 2023-04-01 DIAGNOSIS — T8740 Infection of amputation stump, unspecified extremity: Secondary | ICD-10-CM | POA: Diagnosis not present

## 2023-04-01 LAB — BASIC METABOLIC PANEL
Anion gap: 8 (ref 5–15)
BUN: 22 mg/dL (ref 8–23)
CO2: 30 mmol/L (ref 22–32)
Calcium: 8.4 mg/dL — ABNORMAL LOW (ref 8.9–10.3)
Chloride: 101 mmol/L (ref 98–111)
Creatinine, Ser: 0.45 mg/dL (ref 0.44–1.00)
GFR, Estimated: >60 mL/min (ref >60)
Glucose, Bld: 131 mg/dL — ABNORMAL HIGH (ref 70–99)
Potassium: 4.2 mmol/L (ref 3.5–5.1)
Sodium: 139 mmol/L (ref 135–145)

## 2023-04-01 LAB — CBC
HCT: 28 % — ABNORMAL LOW (ref 36.0–46.0)
Hemoglobin: 8.3 g/dL — ABNORMAL LOW (ref 12.0–15.0)
MCH: 26.4 pg (ref 26.0–34.0)
MCHC: 29.6 g/dL — ABNORMAL LOW (ref 30.0–36.0)
MCV: 89.2 fL (ref 80.0–100.0)
Platelets: 250 10*3/uL (ref 150–400)
RBC: 3.14 MIL/uL — ABNORMAL LOW (ref 3.87–5.11)
RDW: 16.4 % — ABNORMAL HIGH (ref 11.5–15.5)
WBC: 4.7 10*3/uL (ref 4.0–10.5)
nRBC: 0 % (ref 0.0–0.2)

## 2023-04-01 MED ORDER — DIPHENHYDRAMINE HCL 50 MG/ML IJ SOLN: 50.0000 mg | Freq: Once | INTRAMUSCULAR | Status: DC | PRN

## 2023-04-01 MED ORDER — METHYLPREDNISOLONE SODIUM SUCC 125 MG IJ SOLR: 125.0000 mg | Freq: Once | INTRAMUSCULAR | Status: DC | PRN

## 2023-04-01 MED ORDER — MIDAZOLAM HCL 2 MG/ML PO SYRP: 8.0000 mg | ORAL_SOLUTION | Freq: Once | ORAL | Status: DC | PRN

## 2023-04-01 MED ORDER — SODIUM CHLORIDE 0.9 % IV SOLN: INTRAVENOUS | Status: DC

## 2023-04-01 MED ORDER — CEFAZOLIN SODIUM-DEXTROSE 2-4 GM/100ML-% IV SOLN
2.0000 g | INTRAVENOUS | Status: AC
  Administered 2023-04-02: 2 g via INTRAVENOUS
  Filled 2023-04-01: qty 100

## 2023-04-01 MED ORDER — FAMOTIDINE 20 MG PO TABS: 40.0000 mg | ORAL_TABLET | Freq: Once | ORAL | Status: DC | PRN

## 2023-04-01 NOTE — Progress Notes (Signed)
Progress Note   Patient: Carla [name]:[id number] DOB: [birth year] DOA: 03/21/2023     11 DOS: the patient was seen and examined on 04/01/2023   Brief hospital course: [name] is a 62 year old female with history of depression, anxiety, hyperlipidemia, hypertension, tobacco use, paroxysmal atrial fibrillation on Eliquis, obesity, atherosclerosis, CAD, PAD, history of left stump infection post amputation, who presented on 03/21/2023 from vascular outpatient for due to necrosis of the left AKA stump.   Pt was admitted and started on IV antibiotics. Vascular surgery was consulted and took patient to the OR on 5/30 for debridement and wound vac placement.   6/5 s/p debridement done by vascular surgery   Assessment and Plan:  Amputation stump infection/cellulitis of the left thigh with abscess [name]) Vascular service consulted. 5/30 - taken to OR for debridement and wound vac placement 6/1 - wound cultures growing ESBL E coli 6/5 s/p debridement and  wound vac change. --Changed IV antibiotics - Vanc >> Dapto and Cefepime >> Merrem --ID consult for d/c abx recs noted patient will need 4-6 weeks of iv antibiotics with daptomycin and ertapenem. --Pain control  adjusted with scheduled Tylenol, scheduled Oxycontin, PRN Oxy, PRN IV  dilaudid for breakthrough --Titrate PO pain meds in prep for d/c - wean off IV --Bowel regimen Vascular surgery planning to take her to the OR on Wednesday 6/12 for washout and VAC change     Hypotension, resolved Likely due to pain meds and infection. Low BP's have led to pain meds needing to be held.   --Reduce metoprolol 50 >> 25 >> 12.5 mg BID --s/p midodrine 5 mg TID, DC'd on 6/5   Hypokalemia Resolved with replacement on 5/31 Monitor BMP, check Mg level   Chronic anticoagulation Eliquis held on admission -resumed   Acute blood loss anemia Hbg on admission was 12.2, trended down post-operatively to 10.4 >> 9.7 >> 9.4>10.2 No signs of active  bleeding, some serosanginous drainage in wound vac canister. Anemia panel c/w iron deficiency.  B12 level 234 low, goal >400, started vitamin B12 1000 mcg IM injection daily during hospital stay followed by oral supplement.   continue folic acid 1 mg p.o. daily and PO iron supplement w/Vit C --Monitor CBC   PVD (peripheral vascular disease)  Complicated by bilateral AKA's. Mgmt per vascular surgery   Paroxysmal atrial fibrillation  HR's controlled. Eliquis on hold, resume when okay with vascular. Continue metoprolol   COPD (chronic obstructive pulmonary disease) (HCC) Stable, not exacerbated. Continue bronchodilators PRN   Recurrent major depressive disorder (HCC) Appears no longer on Celexa   Iron deficiency anemia Continue PO iron supplement.   Left thigh pain Due to AKA stump infection, with underlying chronic pain syndrome --Pain control per orders       Anxiety and depression Appears no longer on Celexa   Adjustment disorder with anxiety Appears no longer taking Celexa   Chronic pain syndrome Continue home Lyrica. Acute pain control as outlined above. Highly suspect hyperalgesia complicating pain control.   GERD (gastroesophageal reflux disease) Continue PPI   Essential hypertension Continue home Imdur, metoprolol (reduced from 50 >> 25 mg BID due to hypotension)   Pressure injury of skin I agree with the wound description as below. --Continue local wound care --Frequent repositioning --Diligent hygiene --Monitor for signs of infection    Subjective: Seen at the bedside patient more sleepy today  Physical Exam: Vitals:   03/31/23 2346 04/01/23 0455 04/01/23 0750 04/01/23 1303  BP: 104/71 (!) 104/54 [name])  112/51 112/72  Pulse: 86 75 71 80  Resp: 19 18 16 16   Temp: 98.1 F (36.7 C) 98.4 F (36.9 C) 97.8 F (36.6 C) 97.6 F (36.4 C)  TempSrc:      SpO2: 98% 100% 98% 97%  Weight:      Height:      Physical Exam: General: NAD, lying  comfortably Appear in no distress, affect appropriate Eyes: PERRLA ENT: Oral Mucosa Clear, moist  Neck: no JVD,  Cardiovascular: S1 and S2 Present, no Murmur,  Respiratory: good respiratory effort, Bilateral Air entry equal and Decreased, no Crackles, no wheezes Abdomen: Bowel Sound present, Soft and no tenderness,  Skin: no rashes Extremities: RLE amputation, no calf tenderness, s/p Left AKA, wound VAC attached. Neurologic: without any new focal findings Gait not checked due to patient safety concerns  Data Reviewed:     Family Communication: Daugthter Carla [name] not available on phone call or at the bedside.  Disposition: Status is: Inpatient Remains inpatient appropriate because: on multiple  iv antibiotics  Planned Discharge Destination: Rehab    Time spent: 37 minutes  Author: Florencia Reasons, MD 04/01/2023 3:03 PM  For on call review [email or web address].

## 2023-04-02 ENCOUNTER — Other Ambulatory Visit: Payer: Self-pay

## 2023-04-02 ENCOUNTER — Inpatient Hospital Stay: Payer: 59

## 2023-04-02 ENCOUNTER — Encounter
Admission: EM | Disposition: A | Discharge status: Home / Self Care | Payer: Self-pay | Source: Home / Self Care | Attending: Internal Medicine

## 2023-04-02 DIAGNOSIS — T8744 Infection of amputation stump, left lower extremity: Secondary | ICD-10-CM | POA: Diagnosis not present

## 2023-04-02 DIAGNOSIS — Z1612 Extended spectrum beta lactamase (ESBL) resistance: Secondary | ICD-10-CM | POA: Diagnosis not present

## 2023-04-02 DIAGNOSIS — T8740 Infection of amputation stump, unspecified extremity: Secondary | ICD-10-CM | POA: Diagnosis not present

## 2023-04-02 DIAGNOSIS — B9562 Methicillin resistant Staphylococcus aureus infection as the cause of diseases classified elsewhere: Secondary | ICD-10-CM | POA: Diagnosis not present

## 2023-04-02 DIAGNOSIS — B9620 Unspecified Escherichia coli [E. coli] as the cause of diseases classified elsewhere: Secondary | ICD-10-CM | POA: Diagnosis not present

## 2023-04-02 HISTORY — PX: APPLICATION OF WOUND VAC: SHX5189

## 2023-04-02 LAB — AEROBIC/ANAEROBIC CULTURE W GRAM STAIN (SURGICAL/DEEP WOUND): Gram Stain: NONE SEEN

## 2023-04-02 LAB — CBC
HCT: 28.2 % — ABNORMAL LOW (ref 36.0–46.0)
Hemoglobin: 8.6 g/dL — ABNORMAL LOW (ref 12.0–15.0)
MCH: 27 pg (ref 26.0–34.0)
MCHC: 30.5 g/dL (ref 30.0–36.0)
MCV: 88.4 fL (ref 80.0–100.0)
Platelets: 276 10*3/uL (ref 150–400)
RBC: 3.19 MIL/uL — ABNORMAL LOW (ref 3.87–5.11)
RDW: 16.5 % — ABNORMAL HIGH (ref 11.5–15.5)
WBC: 6.1 10*3/uL (ref 4.0–10.5)
nRBC: 0 % (ref 0.0–0.2)

## 2023-04-02 LAB — BASIC METABOLIC PANEL
Anion gap: 8 (ref 5–15)
BUN: 22 mg/dL (ref 8–23)
CO2: 30 mmol/L (ref 22–32)
Calcium: 8.6 mg/dL — ABNORMAL LOW (ref 8.9–10.3)
Chloride: 101 mmol/L (ref 98–111)
Creatinine, Ser: 0.42 mg/dL — ABNORMAL LOW (ref 0.44–1.00)
GFR, Estimated: >60 mL/min (ref >60)
Glucose, Bld: 106 mg/dL — ABNORMAL HIGH (ref 70–99)
Potassium: 4.3 mmol/L (ref 3.5–5.1)
Sodium: 139 mmol/L (ref 135–145)

## 2023-04-02 SURGERY — APPLICATION, WOUND VAC
Anesthesia: General | Laterality: Left

## 2023-04-02 MED ORDER — OXYCODONE HCL 5 MG PO TABS
ORAL_TABLET | ORAL | Status: AC
  Filled 2023-04-02: qty 1

## 2023-04-02 MED ORDER — OXYCODONE HCL 5 MG PO TABS
5.0000 mg | ORAL_TABLET | Freq: Once | ORAL | Status: AC | PRN
  Administered 2023-04-02: 5 mg via ORAL

## 2023-04-02 MED ORDER — KETOROLAC TROMETHAMINE 30 MG/ML IJ SOLN: 30.0000 mg | Freq: Three times a day (TID) | INTRAMUSCULAR | Status: DC

## 2023-04-02 MED ORDER — KETOROLAC TROMETHAMINE 30 MG/ML IJ SOLN
30.0000 mg | Freq: Three times a day (TID) | INTRAMUSCULAR | Status: AC
  Administered 2023-04-02 – 2023-04-04 (×6): 30 mg via INTRAVENOUS
  Filled 2023-04-02 (×6): qty 1

## 2023-04-02 MED ORDER — KETAMINE HCL 10 MG/ML IJ SOLN
INTRAMUSCULAR | Status: DC | PRN
  Administered 2023-04-02: 30 mg via INTRAVENOUS

## 2023-04-02 MED ORDER — FENTANYL CITRATE (PF) 100 MCG/2ML IJ SOLN
INTRAMUSCULAR | Status: AC
  Filled 2023-04-02: qty 2

## 2023-04-02 MED ORDER — MIDAZOLAM HCL 2 MG/2ML IJ SOLN: INTRAMUSCULAR | Status: DC | PRN

## 2023-04-02 MED ORDER — MIDAZOLAM HCL 2 MG/2ML IJ SOLN
INTRAMUSCULAR | Status: DC | PRN
  Administered 2023-04-02: 2 mg via INTRAVENOUS

## 2023-04-02 MED ORDER — ONDANSETRON HCL 4 MG/2ML IJ SOLN
INTRAMUSCULAR | Status: AC
  Filled 2023-04-02: qty 4

## 2023-04-02 MED ORDER — 0.9 % SODIUM CHLORIDE (POUR BTL) OPTIME
TOPICAL | Status: DC | PRN
  Administered 2023-04-02: 1000 mL

## 2023-04-02 MED ORDER — FENTANYL CITRATE (PF) 100 MCG/2ML IJ SOLN
INTRAMUSCULAR | Status: DC | PRN
  Administered 2023-04-02 (×4): 25 ug via INTRAVENOUS

## 2023-04-02 MED ORDER — PROPOFOL 10 MG/ML IV BOLUS
INTRAVENOUS | Status: DC | PRN
  Administered 2023-04-02: 30 mg via INTRAVENOUS
  Administered 2023-04-02: 20 mg via INTRAVENOUS

## 2023-04-02 MED ORDER — PROPOFOL 10 MG/ML IV BOLUS
INTRAVENOUS | Status: AC
  Filled 2023-04-02: qty 40

## 2023-04-02 MED ORDER — OXYCODONE HCL 5 MG/5ML PO SOLN: 5.0000 mg | Freq: Once | ORAL | Status: AC | PRN

## 2023-04-02 MED ORDER — PROPOFOL 500 MG/50ML IV EMUL
INTRAVENOUS | Status: DC | PRN
  Administered 2023-04-02: 120 ug/kg/min via INTRAVENOUS

## 2023-04-02 MED ORDER — LIDOCAINE HCL (PF) 2 % IJ SOLN
INTRAMUSCULAR | Status: AC
  Filled 2023-04-02: qty 5

## 2023-04-02 MED ORDER — MIDAZOLAM HCL 2 MG/2ML IJ SOLN
INTRAMUSCULAR | Status: AC
  Filled 2023-04-02: qty 2

## 2023-04-02 MED ORDER — PROPOFOL 10 MG/ML IV BOLUS
INTRAVENOUS | Status: AC
  Filled 2023-04-02: qty 20

## 2023-04-02 MED ORDER — KETAMINE HCL 50 MG/5ML IJ SOSY
PREFILLED_SYRINGE | INTRAMUSCULAR | Status: AC
  Filled 2023-04-02: qty 5

## 2023-04-02 MED ORDER — DEXAMETHASONE SODIUM PHOSPHATE 10 MG/ML IJ SOLN
INTRAMUSCULAR | Status: AC
  Filled 2023-04-02: qty 1

## 2023-04-02 MED ORDER — ONDANSETRON HCL 4 MG/2ML IJ SOLN
INTRAMUSCULAR | Status: DC | PRN
  Administered 2023-04-02: 4 mg via INTRAVENOUS

## 2023-04-02 MED ORDER — FENTANYL CITRATE (PF) 100 MCG/2ML IJ SOLN
25.0000 ug | INTRAMUSCULAR | Status: DC | PRN
  Administered 2023-04-02 (×2): 50 ug via INTRAVENOUS

## 2023-04-02 SURGICAL SUPPLY — 22 items
BASIN KIT SINGLE STR (MISCELLANEOUS) IMPLANT
CANISTER WOUND CARE 500ML ATS (WOUND CARE) ×1 IMPLANT
DERMATAC DRAPE AND VAC GRANUFOAM IMPLANT
DRAPE INCISE IOBAN 66X45 STRL (DRAPES) ×1 IMPLANT
DRAPE LAPAROTOMY 100X77 ABD (DRAPES) ×1 IMPLANT
DRSG VAC GRANUFOAM MED (GAUZE/BANDAGES/DRESSINGS) IMPLANT
ELECT REM PT RETURN 9FT ADLT (ELECTROSURGICAL) ×1
ELECTRODE REM PT RTRN 9FT ADLT (ELECTROSURGICAL) ×1 IMPLANT
GLOVE BIO SURGEON STRL SZ7 (GLOVE) ×2 IMPLANT
GOWN STRL REUS W/ TWL LRG LVL3 (GOWN DISPOSABLE) ×2 IMPLANT
GOWN STRL REUS W/ TWL XL LVL3 (GOWN DISPOSABLE) ×1 IMPLANT
GOWN STRL REUS W/TWL LRG LVL3 (GOWN DISPOSABLE) ×1
GOWN STRL REUS W/TWL XL LVL3 (GOWN DISPOSABLE) ×1
GRAFT SKIN WND MICRO 38 (Tissue) IMPLANT
KIT TURNOVER KIT A (KITS) ×1 IMPLANT
MANIFOLD NEPTUNE II (INSTRUMENTS) ×1 IMPLANT
NS IRRIG 1000ML POUR BTL (IV SOLUTION) ×1 IMPLANT
PACK BASIN MINOR ARMC (MISCELLANEOUS) ×1 IMPLANT
SOL PREP PVP 2OZ (MISCELLANEOUS) ×2
SOLUTION PREP PVP 2OZ (MISCELLANEOUS) ×1 IMPLANT
TRAP FLUID SMOKE EVACUATOR (MISCELLANEOUS) ×1 IMPLANT
WATER STERILE IRR 500ML POUR (IV SOLUTION) ×1 IMPLANT

## 2023-04-02 NOTE — Interval H&P Note (Signed)
History and Physical Interval Note:  04/02/2023 7:47 AM  Carla [name]  has presented today for surgery, with the diagnosis of infection.  The various methods of treatment have been discussed with the patient and family. After consideration of risks, benefits and other options for treatment, the patient has consented to  Procedure(s): LEFT STUMP WOUND VAC EXCHANGE (Left) as a surgical intervention.  The patient's history has been reviewed, patient examined, no change in status, stable for surgery.  I have reviewed the patient's chart and labs.  Questions were answered to the patient's satisfaction.     Festus Barren

## 2023-04-02 NOTE — Anesthesia Preprocedure Evaluation (Signed)
Anesthesia Evaluation  Patient identified by MRN, date of birth, ID band Patient confused    Reviewed: Allergy & Precautions, NPO status , Patient's Chart, lab work & pertinent test results  Airway Mallampati: IV  TM Distance: >3 FB Neck ROM: full    Dental  (+) Edentulous Upper, Edentulous Lower, Dental Advidsory Given   Pulmonary shortness of breath, COPD,  COPD inhaler, Patient abstained from smoking., former smoker    + wheezing      Cardiovascular Exercise Tolerance: Poor hypertension, Pt. on medications + angina  + CAD, + Past MI, + Peripheral Vascular Disease (previous left axillary to femoral artery bypass) and +CHF   Rhythm:Regular  Cath 01/2023:   Prox RCA lesion is 30% stenosed.   1st Diag lesion is 50% stenosed.   The left ventricular systolic function is normal.   LV end diastolic pressure is normal.   The left ventricular ejection fraction is greater than 65% by visual estimate.   There is no aortic valve stenosis.   Post-Cath Findings  Severe spasm of the proximal RCA existing lesion (roughly 30%) => catheter related spasm was 90% reduced to 30% after IC NTG  Mild D1 disease but otherwise no significant CAD.  Normal LVEF with no RWMA and normal LVEDP.       Neuro/Psych  PSYCHIATRIC DISORDERS Anxiety      Neuromuscular disease    GI/Hepatic Neg liver ROS,GERD  Controlled,,  Endo/Other  negative endocrine ROS  Morbid obesity  Renal/GU Renal InsufficiencyRenal disease     Musculoskeletal   Abdominal  (+) + obese  Peds negative pediatric ROS (+)  Hematology  (+) Blood dyscrasia, anemia   Anesthesia Other Findings Recent right AKA with stump infection Presented with acute left leg iscemia s/p revascularization.  Past Medical History: No date: Abdominal aortic atherosclerosis (HCC)     Comment:  a. 05/2017 CTA abd/pelvis: significant atherosclerotic               dzs of the infrarenal abd Ao  w/ some mural thrombus. No               aneurysm or dissection. No date: Acute focal ischemia of small intestine (HCC) 06/13/2017: Acute right-sided low back pain with right-sided sciatica 07/29/2017: AKI (acute kidney injury) (HCC) No date: Anemia No date: Anginal pain (HCC)     Comment:  a. 08/2012 Lexiscan MV: EF 54%, non ischemia/infarct. No date: Anxiety and depression No date: Baker's cyst of knee, right     Comment:  a. 07/2016 U/S: 4.1 x 1.4 x 2.9 cystic structure in R               poplitetal fossa. No date: Bell's palsy No date: CAD (coronary artery disease) No date: CHF (congestive heart failure) (HCC)     Comment:  a.) TTE 06/20/2017: EF 60-65%, no rwma, G1DD, no source               of cardiac emboli. No date: Chronic anticoagulation     Comment:  a.) apixaban No date: Chronic idiopathic constipation No date: Chronic mesenteric ischemia (HCC) No date: Chronic pain No date: COPD (chronic obstructive pulmonary disease) (HCC) No date: Dyspnea No date: Embolus of superior mesenteric artery (HCC)     Comment:  a. 05/2017 CTA Abd/pelvis: apparent thrombus or embolus               in prox SMA (70-90%); b. 05/2017 catheter directed tPA,  mechanical thrombectomy, and stenting of the SMA. 01/19/2016: Essential hypertension with goal blood pressure less than  140/90 01/19/2016: Gastroesophageal reflux disease No date: GERD (gastroesophageal reflux disease) No date: H/O colectomy 11/14/2019: History of 2019 novel coronavirus disease (COVID-19) No date: History of kidney stones No date: Hyperlipidemia No date: Hypertension No date: Morbid obesity with BMI of 40.0-44.9, adult (HCC) 06/27/2021: NSTEMI (non-ST elevated myocardial infarction) (HCC)     Comment:  a.) high sensitivity troponins trended: 893 --> 1587 -->              1748 --> 868 --> 735 ng/L 06/19/2017: Occlusive mesenteric ischemia (HCC) No date: Osteoarthritis No date: PAD (peripheral artery disease)  (HCC) No date: PAF (paroxysmal atrial fibrillation) (HCC)     Comment:  a.) CHA2DS2-VASc = 6 (sex, CHF, HTN, DVT x2, aortic               plaque). b.) rate/rhythm maintained on oral metoprolol               tartrate; chronically anticoagulated with standard dose               apixaban. No date: Palpitations No date: Peripheral edema 06/01/2016: Personal history of other malignant neoplasm of skin No date: Pneumonia No date: Pre-diabetes 06/13/2017: Primary osteoarthritis of both knees 08/06/2017: SBO (small bowel obstruction) (HCC) No date: Skin cancer of nose No date: Superior mesenteric artery thrombosis (HCC) No date: Vitamin D deficiency  Past Surgical History: 04/06/2022: AMPUTATION; Right     Comment:  Procedure: AMPUTATION ABOVE KNEE;  Surgeon: Annice Needy, MD;  Location: [facility];  Service: Vascular;                Laterality: Right; 12/09/2019: AMPUTATION TOE; Left     Comment:  Procedure: AMPUTATION TOE MPJ left;  Surgeon: Rosetta Posner, DPM;  Location: [facility];  Service: Podiatry;                Laterality: Left; No date: APPENDECTOMY 12/01/2021: APPLICATION OF WOUND VAC     Comment:  Procedure: APPLICATION OF WOUND VAC;  Surgeon: Annice Needy, MD;  Location: [facility];  Service: Vascular;; 05/03/2022: APPLICATION OF WOUND VAC; Right     Comment:  Procedure: APPLICATION OF WOUND VAC;  Surgeon: Annice Needy, MD;  Location: [facility];  Service: Vascular;                Laterality: Right; 07/23/2022: APPLICATION OF WOUND VAC; Left     Comment:  Procedure: APPLICATION OF WOUND VAC;  Surgeon: Nada Libman, MD;  Location: [facility];  Service: Vascular;                Laterality: Left;  Prevena 08/28/2022: APPLICATION OF WOUND VAC; Right     Comment:  Procedure: APPLICATION OF WOUND VAC;  Surgeon: Annice Needy, MD;  Location: [facility];  Service: Vascular;                Laterality:  Right; 07/23/2022: AXILLARY-FEMORAL BYPASS GRAFT; Left     Comment:  Procedure: BYPASS GRAFT AXILLA-BIFEMORAL;  Surgeon:               Nada Libman, MD;  Location: [facility];  Service:               Vascular;  Laterality: Left; No date: CHOLECYSTECTOMY No date: COLON SURGERY 03/23/2022: COLONOSCOPY WITH PROPOFOL; N/A     Comment:  Procedure: COLONOSCOPY WITH PROPOFOL;  Surgeon: Wyline Mood, MD;  Location: [facility];  Service:               Gastroenterology;  Laterality: N/A;  rectal bleed 06/21/2020: EMBOLECTOMY OR THROMBECTOMY, WITH OR WITHOUT CATHETER;  FEMOROPOPLITEAL, AORTOILIAC ARTERY, BY LEG INCISION; N/A     Comment:  Left - Decomp Fasciotomy Leg; Ant &/Or Lat Compart Only;              Midline - Angiography, Visceral, Selective Or               Supraselective (With Or Without Flush Aortogram); Left -               Revascularize, Endovasc, Open/Percut, Iliac Artery,               Unilat, Initial Vessel; W/Translum Stent, W/Angioplasty;               Right - Revascularize, Endovasc, Open/Percut, Iliac               Artery, Ea Add`L Ipsilateral; W/Translumin Stent,               W/Angioplasty; Location [facility]; 12/01/2021: ENDARTERECTOMY FEMORAL; Left     Comment:  Procedure: ENDARTERECTOMY FEMORAL;  Surgeon: Annice Needy, MD;  Location: [facility];  Service: Vascular;                Laterality: Left; 12/01/2021: ENDOVASCULAR REPAIR/STENT GRAFT; Left     Comment:  Procedure: ENDOVASCULAR REPAIR/STENT GRAFT;  Surgeon:               Annice Needy, MD;  Location: [facility];                Service: Cardiovascular;  Laterality: Left; 05/03/2022: INCISION AND DRAINAGE OF WOUND; Right     Comment:  Procedure: IRRIGATION AND DEBRIDEMENT RIGHT STUMP;                Surgeon: Annice Needy, MD;  Location: [facility];  Service:              Vascular;  Laterality: Right; 08/28/2022: IRRIGATION AND DEBRIDEMENT ABSCESS; Right     Comment:  Procedure: AKA IRRIGATION  AND DEBRIDEMENT ABSCESS;                Surgeon: Annice Needy, MD;  Location: [facility];  Service:              Vascular;  Laterality: Right; 08/08/2017: LAPAROTOMY; N/A     Comment:  Procedure: EXPLORATORY LAPAROTOMY POSSIBLE BOWEL               RESECTION;  Surgeon: Leafy Ro, MD;  Location: [facility]               [facility];  Service: General;  Laterality: N/A; 05/17/2021: LEFT  HEART CATH AND CORONARY ANGIOGRAPHY; N/A     Comment:  Procedure: LEFT HEART CATH AND CORONARY ANGIOGRAPHY;                Surgeon: Alwyn Pea, MD;  Location: [facility]              [facility];  Service: Cardiovascular;  Laterality: N/A; 11/17/2019: LOWER EXTREMITY ANGIOGRAPHY; Left     Comment:  Procedure: LOWER EXTREMITY ANGIOGRAPHY;  Surgeon: Annice Needy, MD;  Location: [facility];  Service:               Cardiovascular;  Laterality: Left; 12/13/2020: LOWER EXTREMITY ANGIOGRAPHY; Right     Comment:  Procedure: LOWER EXTREMITY ANGIOGRAPHY;  Surgeon: Annice Needy, MD;  Location: [facility];  Service:               Cardiovascular;  Laterality: Right; 01/10/2021: LOWER EXTREMITY ANGIOGRAPHY; Right     Comment:  Procedure: LOWER EXTREMITY ANGIOGRAPHY;  Surgeon: Annice Needy, MD;  Location: [facility];  Service:               Cardiovascular;  Laterality: Right; 01/24/2021: LOWER EXTREMITY ANGIOGRAPHY; Right     Comment:  Procedure: LOWER EXTREMITY ANGIOGRAPHY;  Surgeon: Annice Needy, MD;  Location: [facility];  Service:               Cardiovascular;  Laterality: Right; 06/28/2021: LOWER EXTREMITY ANGIOGRAPHY; Bilateral     Comment:  Procedure: Lower Extremity Angiography;  Surgeon: Annice Needy, MD;  Location: [facility];  Service:               Cardiovascular;  Laterality: Bilateral; 11/28/2021: LOWER EXTREMITY ANGIOGRAPHY; Right     Comment:  Procedure: LOWER EXTREMITY ANGIOGRAPHY;  Surgeon:  Annice Needy, MD;  Location: [facility];  Service:               Cardiovascular;  Laterality: Right; 11/30/2021: LOWER EXTREMITY ANGIOGRAPHY; Left     Comment:  Procedure: Lower Extremity Angiography;  Surgeon: Annice Needy, MD;  Location: [facility];  Service:               Cardiovascular;  Laterality: Left; 02/22/2022: LOWER EXTREMITY ANGIOGRAPHY; Right     Comment:  Procedure: Lower Extremity Angiography;  Surgeon: Annice Needy, MD;  Location: [facility];  Service:               Cardiovascular;  Laterality: Right; 03/13/2022: LOWER EXTREMITY ANGIOGRAPHY; Right     Comment:  Procedure: Lower Extremity Angiography;  Surgeon: Annice Needy, MD;  Location: [facility];  Service:  Cardiovascular;  Laterality: Right; 03/24/2022: LOWER EXTREMITY ANGIOGRAPHY; Right     Comment:  Procedure: Lower Extremity Angiography;  Surgeon: Annice Needy, MD;  Location: [facility];  Service:               Cardiovascular;  Laterality: Right; 11/19/2019: LOWER EXTREMITY INTERVENTION; N/A     Comment:  Procedure: LOWER EXTREMITY INTERVENTION;  Surgeon: Annice Needy, MD;  Location: [facility];  Service:               Cardiovascular;  Laterality: N/A; 06/29/2021: LOWER EXTREMITY INTERVENTION; Bilateral     Comment:  Procedure: LOWER EXTREMITY INTERVENTION;  Surgeon: Annice Needy, MD;  Location: [facility];  Service:               Cardiovascular;  Laterality: Bilateral; 06/22/2017: TEE WITHOUT CARDIOVERSION; N/A     Comment:  Procedure: TRANSESOPHAGEAL ECHOCARDIOGRAM (TEE);                Surgeon: Iran Ouch, MD;  Location: [facility];                Service: Cardiovascular;  Laterality: N/A; 07/11/2018: TOTAL KNEE ARTHROPLASTY; Right     Comment:  Procedure: TOTAL KNEE ARTHROPLASTY;  Surgeon: Christena Flake, MD;  Location:  [facility];  Service: Orthopedics;                Laterality: Right; No date: VAGINAL HYSTERECTOMY 06/20/2017: VISCERAL ARTERY INTERVENTION; N/A     Comment:  Procedure: Visceral Artery Intervention, possible aortic              thrombectomy;  Surgeon: Annice Needy, MD;  Location: [facility];  Service: Cardiovascular;  Laterality:               N/A; 01/28/2018: VISCERAL ARTERY INTERVENTION; N/A     Comment:  Procedure: VISCERAL ARTERY INTERVENTION;  Surgeon: Annice Needy, MD;  Location: [facility];  Service:               Cardiovascular;  Laterality: N/A;  BMI    Body Mass Index: 40.24 kg/m      Reproductive/Obstetrics negative OB ROS                              Anesthesia Physical Anesthesia Plan  ASA: 3  Anesthesia Plan: General   Post-op Pain Management:    Induction: Intravenous  PONV Risk Score and Plan: Ondansetron, Propofol infusion and TIVA  Airway Management Planned: Natural Airway and Simple Face Mask  Additional Equipment:   Intra-op Plan:   Post-operative Plan:   Informed Consent: I have reviewed the patients History and Physical, chart, labs and discussed the procedure including the risks, benefits and alternatives for the proposed anesthesia with the patient or authorized representative who has indicated his/her understanding and acceptance.     Dental Advisory Given  Plan Discussed with:  Anesthesiologist, CRNA and Surgeon  Anesthesia Plan Comments: (Patient consented for risks of anesthesia including but not limited to:  - adverse reactions to medications - damage to eyes, teeth, lips or other oral mucosa - nerve damage due to positioning  - sore throat or hoarseness - Damage to heart, brain, nerves, lungs, other parts of body or loss of life  Patient voiced understanding.)        Anesthesia Quick Evaluation

## 2023-04-02 NOTE — Progress Notes (Signed)
Date of Admission:  03/21/2023      ID: Carla [name] is a 62 y.o. female  Principal Problem:   Amputation stump infection (HCC) Active Problems:   Essential hypertension   GERD (gastroesophageal reflux disease)   Chronic pain syndrome   Recurrent major depressive disorder (HCC)   Hypotension   Adjustment disorder with anxiety   Paroxysmal atrial fibrillation (HCC)   COPD (chronic obstructive pulmonary disease) (HCC)   PVD (peripheral vascular disease) (HCC)   Hypokalemia   Chronic anticoagulation   Anxiety and depression   Acute blood loss anemia   Iron deficiency anemia   Pressure injury of skin   Cellulitis of left thigh   Left thigh pain    Subjective:  Pt went for vac chage today  Medications:   acetaminophen  1,000 mg Oral Q8H   apixaban  5 mg Oral BID   ascorbic acid  500 mg Oral BID   atorvastatin  40 mg Oral QHS   cyanocobalamin  1,000 mcg Intramuscular Daily   Followed by   Melene Muller ON 04/04/2023] vitamin B-12  1,000 mcg Oral Daily   feeding supplement  237 mL Oral BID BM   ferrous sulfate  325 mg Oral Q breakfast   folic acid  1 mg Oral Daily   isosorbide mononitrate  120 mg Oral Daily   metoprolol tartrate  12.5 mg Oral BID   nystatin cream   Topical BID   oxyCODONE  30 mg Oral Q12H   pantoprazole  40 mg Oral Daily   polyethylene glycol  17 g Oral Daily   pregabalin  200 mg Oral TID   senna-docusate  1 tablet Oral BID   Vitamin D (Ergocalciferol)  50,000 Units Oral Q7 days   zinc sulfate  220 mg Oral Daily    Objective: Vital signs in last 24 hours: Patient Vitals for the past 24 hrs:  BP Temp Temp src Pulse Resp SpO2  04/02/23 1053 (!) 118/57 98.7 F (37.1 C) Oral 89 15 93 %  04/02/23 0900 134/78 98.5 F (36.9 C) -- 80 16 99 %  04/02/23 0853 -- -- -- 79 10 100 %  04/02/23 0845 (!) 154/71 97.9 F (36.6 C) -- 85 19 100 %  04/02/23 0835 -- -- -- 79 15 100 %  04/02/23 0830 (!) 141/97 -- -- 77 16 100 %  04/02/23 0826 -- 97.6 F (36.4 C) --  -- -- 100 %  04/02/23 0657 (!) 107/58 (!) 97.4 F (36.3 C) Temporal 74 18 93 %  04/02/23 0524 (!) 101/56 98.3 F (36.8 C) Oral 74 17 93 %  04/02/23 0210 103/68 98.4 F (36.9 C) Oral 86 19 93 %  04/01/23 1952 110/64 98.2 F (36.8 C) Oral 85 16 94 %  04/01/23 1613 (!) 93/51 -- -- 77 -- 98 %  04/01/23 1603 (!) 83/46 98.6 F (37 C) -- 79 16 93 %     PHYSICAL EXAM:  General: Alert, cooperative, some distress, pale Lungs: Clear to auscultation bilaterally. No Wheezing or Rhonchi. No rales. Heart:s1s2 Abdomen: Soft, non-tender,not distended. Bowel sounds normal. No masses Extremities: b/l AKA- -left has wound vac R stump  small wound almost closed Skin: No rashes or lesions. Or bruising Lymph: Cervical, supraclavicular normal. Neurologic: Grossly non-focal  Lab Results    Latest Ref Rng & Units 04/02/2023    4:00 AM 04/01/2023    3:46 AM 03/31/2023    4:10 AM  CBC  WBC 4.0 - 10.5 K/uL 6.1  4.7  5.0   Hemoglobin 12.0 - 15.0 g/dL 8.6  8.3  8.7   Hematocrit 36.0 - 46.0 % 28.2  28.0  28.8   Platelets 150 - 400 K/uL 276  250  278        Latest Ref Rng & Units 04/02/2023    4:00 AM 04/01/2023    3:46 AM 03/31/2023    4:10 AM  CMP  Glucose 70 - 99 mg/dL 161  096  045   BUN 8 - 23 mg/dL 22  22  17    Creatinine 0.44 - 1.00 mg/dL 4.09  8.11  9.14   Sodium 135 - 145 mmol/L 139  139  138   Potassium 3.5 - 5.1 mmol/L 4.3  4.2  4.7   Chloride 98 - 111 mmol/L 101  101  99   CO2 22 - 32 mmol/L 30  30  29    Calcium 8.9 - 10.3 mg/dL 8.6  8.4  8.8       Microbiology: Left AKA stump  surgical wound has ESBL ecoli and proteus in the wound MRSA     Assessment/Plan: 62 y.o. female with a history of A-fib, CHF, PAD, COPD, HLD, hypertension, right AKA, stump infection with ESBL E. coli and Enterococcus and MRSA needing revision of the amputation site and IV antibiotics followed by oral omadacycline, last seen by me in my office in April 2024, left leg irreversible ischemia April 2024 and was in  [facility] 02/15/23-02/24/23 needing left above-the-knee amputation on 02/21/2023 She went for a vascular follow up appt on 03/21/23 and they noted the surgical site was necrotic and had purulent discharge. She was asked to get admitted   Left AKA stump infection- ESBL ecoli and proteus. MRSA  in the  wound Currently on meropenem and daptomycin   She is going to need Iv antibitcs for 4 -6 weeks Will need IV antibiotic ( ertapenem and dapto on discharge)   Rt AKA stump= wound Is very small and almost closed Had chronic infection for 3 months ( MRSA, ESBL, enterococcus) and took many courses of antibiotics   Current smoker Severe PAD with stents   CAD Imdur Metoprolol atorvastatin   HTN? ? AFIB controlled- on eliquis   Anemia    Discussed the management with the patient

## 2023-04-02 NOTE — Transfer of Care (Signed)
Immediate Anesthesia Transfer of Care Note  Patient: Carla [name]  Procedure(s) Performed: LEFT STUMP WOUND VAC EXCHANGE (Left)  Patient Location: PACU  Anesthesia Type:General  Level of Consciousness: awake  Airway & Oxygen Therapy: Patient Spontanous Breathing and Patient connected to face mask oxygen  Post-op Assessment: Report given to RN and Post -op Vital signs reviewed and stable  Post vital signs: Reviewed and stable  Last Vitals:  Vitals Value Taken Time  BP 116/66 04/02/23 0826  Temp    Pulse 77 04/02/23 0829  Resp 16 04/02/23 0829  SpO2 100 % 04/02/23 0829  Vitals shown include unvalidated device data.  Last Pain:  Vitals:   04/02/23 0657  TempSrc: Temporal  PainSc: 9       Patients Stated Pain Goal: 2 (04/01/23 1341)  Complications: No notable events documented.

## 2023-04-02 NOTE — Anesthesia Postprocedure Evaluation (Signed)
Anesthesia Post Note  Patient: Carla [name]  Procedure(s) Performed: LEFT STUMP WOUND VAC EXCHANGE (Left)  Patient location during evaluation: PACU Anesthesia Type: General Level of consciousness: awake and alert Pain management: pain level controlled Vital Signs Assessment: post-procedure vital signs reviewed and stable Respiratory status: spontaneous breathing, nonlabored ventilation, respiratory function stable and patient connected to nasal cannula oxygen Cardiovascular status: blood pressure returned to baseline and stable Postop Assessment: no apparent nausea or vomiting Anesthetic complications: no   No notable events documented.   Last Vitals:  Vitals:   04/02/23 0900 04/02/23 1053  BP: 134/78 (!) 118/57  Pulse: 80 89  Resp: 16 15  Temp: 36.9 C 37.1 C  SpO2: 99% 93%    Last Pain:  Vitals:   04/02/23 1053  TempSrc: Oral  PainSc:                  Cleda Mccreedy Alease Fait

## 2023-04-02 NOTE — Op Note (Signed)
    OPERATIVE NOTE   PROCEDURE: Irrigation and debridement of skin and soft tissue approximately 130 cm to the left AKA stump. Negative pressure dressing placement.  PRE-OPERATIVE DIAGNOSIS: Nonviable tissue and infection of left AKA stump  POST-OPERATIVE DIAGNOSIS: Same as above  SURGEON: Festus Barren, MD  ASSISTANT(S): Rolla Plate, NP  ANESTHESIA: MAC  ESTIMATED BLOOD LOSS: 10 cc  FINDING(S): None  SPECIMEN(S):  none  INDICATIONS:   Carla [name] is a 62 y.o. female who presents with a left AKA stump with necrotic fat and fibrinous exudate.  It has had two trips to the OR and is coming back today for further debridement.  An assistant was present during the procedure to help facilitate the exposure and expedite the procedure. Risks and benefits are discussed.  DESCRIPTION: After obtaining full informed written consent, the patient was brought back to the operating room and placed supine upon the operating table.  The patient received IV antibiotics prior to induction.  After obtaining adequate anesthesia, the patient was prepped and draped in the standard fashion for.  The wound was then opened and excisional debridement was performed to the skin and soft tissue, especially the necrotic fat to remove all clearly non-viable tissue.  The tissue was taken back to bleeding tissue that appeared viable.  The debridement was performed with metzenbaum scissors and electrocautery and encompassed an area of approximately 130 cm2. The wound was about 19-20 cm in length, 7 cm in width and 5 cm in depth. At this point, the wound was irrigated copiously with saline.  After all clearly non-viable tissue was removed, Kerecis was placed and a large VAC sponge was fit to the wound.  Strips of Ioban were placed and it was connected to suction tubing and good seal was obtained. The patient was then awakened from anesthesia and taken to the recovery room in stable condition having tolerated the procedure  well.  COMPLICATIONS: none  CONDITION: stable  Festus Barren  04/02/2023, 9:00 AM   This note was created with Dragon Medical transcription system. Any errors in dictation are purely unintentional.

## 2023-04-02 NOTE — Progress Notes (Signed)
Consult for PIV placement. Discussed POC with nurse. Sufficient vascular access at this time. Tomasita Morrow, RN VAST

## 2023-04-02 NOTE — Progress Notes (Signed)
Progress Note   Patient: Carla [name] ZOX:[id number] DOB: 09/28/1961 DOA: 03/21/2023     12 DOS: the patient was seen and examined on 04/02/2023   Brief hospital course: Ms. Latrena Nicodemus is a 62 year old female with history of depression, anxiety, hyperlipidemia, hypertension, tobacco use, paroxysmal atrial fibrillation on Eliquis, obesity, atherosclerosis, CAD, PAD, history of left stump infection post amputation, who presented on 03/21/2023 from vascular outpatient for due to necrosis of the left AKA stump.   Pt was admitted and started on IV antibiotics. Vascular surgery was consulted and took patient to the OR on 5/30 for debridement and wound vac placement.   6/5 s/p debridement done by vascular surgery 6/10 irrigation and debridement of skin and soft tissue approximately 130 cm to the left AKA stump.  Negative pressure dressing placement   Assessment and Plan:  Amputation stump infection/cellulitis of the left thigh with abscess [facility]) Vascular service consulted. 5/30 - taken to OR for debridement and wound vac placement 6/1 - wound cultures growing ESBL E coli 6/5 s/p debridement and  wound vac change. 6/10 irrigation and debridement of skin and soft tissue approximately 130 cm to the left AKA stump.  Negative pressure dressing placement  --Changed IV antibiotics - Vanc >> Dapto and Cefepime >> Merrem --ID consult for d/c abx recs noted patient will need 4-6 weeks of iv antibiotics with daptomycin and ertapenem. --Pain control  adjusted with scheduled Tylenol, scheduled Oxycontin, PRN Oxy, PRN IV  dilaudid for breakthrough --Titrate PO pain meds in prep for d/c - wean off IV --Bowel regimen      Hypotension, resolved Likely due to pain meds and infection. Low BP's have led to pain meds needing to be held.   --Reduce metoprolol 50 >> 25 >> 12.5 mg BID --s/p midodrine 5 mg TID, DC'd on 6/5   Hypokalemia Resolved with replacement on 5/31 Monitor BMP, check Mg level   Chronic  anticoagulation Eliquis held on admission -resumed   Acute blood loss anemia Hbg on admission was 12.2, trended down post-operatively to 10.4 >> 9.7 >> 9.4>10.2 No signs of active bleeding, some serosanginous drainage in wound vac canister. Anemia panel c/w iron deficiency.  B12 level 234 low, goal >400, started vitamin B12 1000 mcg IM injection daily during hospital stay followed by oral supplement.   continue folic acid 1 mg p.o. daily and PO iron supplement w/Vit C --Monitor CBC   PVD (peripheral vascular disease)  Complicated by bilateral AKA's. Mgmt per vascular surgery   Paroxysmal atrial fibrillation  HR's controlled. Eliquis on hold, resume when okay with vascular. Continue metoprolol   COPD (chronic obstructive pulmonary disease) (HCC) Stable, not exacerbated. Continue bronchodilators PRN   Recurrent major depressive disorder (HCC) Appears no longer on Celexa   Iron deficiency anemia Continue PO iron supplement.   Left thigh pain Due to AKA stump infection, with underlying chronic pain syndrome --Pain control per orders       Anxiety and depression Appears no longer on Celexa   Adjustment disorder with anxiety Appears no longer taking Celexa   Chronic pain syndrome Continue home Lyrica. Acute pain control as outlined above. Highly suspect hyperalgesia complicating pain control.   GERD (gastroesophageal reflux disease) Continue PPI   Essential hypertension Continue home Imdur, metoprolol (reduced from 50 >> 25 mg BID due to hypotension)   Pressure injury of skin I agree with the wound description as below. --Continue local wound care --Frequent repositioning --Diligent hygiene --Monitor for signs of infection  Subjective: Patient seen and examined in PACU.  Complains of severe pain involving the left AKA stump.  Physical Exam: Vitals:   04/02/23 0845 04/02/23 0853 04/02/23 0900 04/02/23 1053  BP: (!) 154/71  134/78 (!) 118/57  Pulse: 85  79 80 89  Resp: 19 10 16 15   Temp: 97.9 F (36.6 C)  98.5 F (36.9 C) 98.7 F (37.1 C)  TempSrc:    Oral  SpO2: 100% 100% 99% 93%  Weight:      Height:       General: NAD, lying comfortably Appear in no distress, affect appropriate Eyes: PERRLA ENT: Oral Mucosa Clear, moist  Neck: no JVD,  Cardiovascular: S1 and S2 Present, no Murmur,  Respiratory: good respiratory effort, Bilateral Air entry equal and Decreased, no Crackles, no wheezes Abdomen: Bowel Sound present, Soft and no tenderness,  Skin: no rashes Extremities: RLE amputation, no calf tenderness, s/p Left AKA, wound VAC attached. Neurologic: without any new focal findings Gait not checked due to patient safety concerns Data Reviewed: Labs reviewed There are no new results to review at this time.  Family Communication: None  Disposition: Status is: Inpatient Remains inpatient appropriate because: On IV antibiotics for left BKA stump infection  Planned Discharge Destination: Skilled nursing facility    Time spent: 35  minutes  Author: Lucile Shutters, MD 04/02/2023 1:17 PM  For on call review [email or web address].

## 2023-04-03 ENCOUNTER — Encounter: Payer: Self-pay | Admitting: Vascular Surgery

## 2023-04-03 DIAGNOSIS — T8740 Infection of amputation stump, unspecified extremity: Secondary | ICD-10-CM | POA: Diagnosis not present

## 2023-04-03 NOTE — Consult Note (Signed)
Pharmacy Antibiotic Note  Carla [name] is a 62 y.o. female admitted on 03/21/2023 with  wound infection . Patient was sent to ED by vascular given concern for possible osteomyelitis. Patient has received multiple IV antibiotics this year including meropenem, daptomycin, and ertapenem in February and March. She has also received other oral agents including omadacycline and metronidazole this year. Historical wound cultures have grown resistant bacteria including MRSA and ESBL E. coli. Pharmacy consulted to dose daptomycin and meropenem for L AKA stump wound infection. Discussed GPC from 5/30 culture with micro lab and they were unable to isolate that organisms. 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   S/P wound vac exchange on 6/5- repeat cultures sent from OR. Noted patient is on atorvastatin 80 mg for PVD. Reduced to atorvastatin 40mg .   -  baseline CK 6/5 = 14 - 6/5 wound culture with S aureus isolated, susceptibilities pending. Also proteus mirabilis which was seen in previous culture  Wound vac exchange on 6/10, no labs today 6/11, next CK scheduled for 6/12  Plan: Day# 14 of Abx, Day# 11 on meropenem Continue meropenem 1 g IV q8H Continue daptomycin 500 mg IV Q24H (~8 mg/kg AdjBW for BMI>30)  Monitor renal function, C/S, CK weekly, F/U discharge plans  Await OPAT  Height: 5\' 2"  (157.5 cm) Weight: 79.4 kg (175 lb) IBW/kg (Calculated) : 50.1  Temp (24hrs), Avg:98.2 F (36.8 C), Min:97.8 F (36.6 C), Max:98.5 F (36.9 C)  Recent Labs  Lab 03/29/23 0645 03/30/23 0404 03/31/23 0410 04/01/23 0346 04/02/23 0400  WBC 7.9 6.5 5.0 4.7 6.1  CREATININE 0.40* 0.37* 0.44 0.45 0.42*     Estimated Creatinine Clearance: 70.2 mL/min (A) (by C-G formula based on SCr of 0.42 mg/dL (L)).    Allergies  Allergen Reactions   Gabapentin     Tremors    Bactrim [Sulfamethoxazole-Trimethoprim] Itching    Antimicrobials this  admission: 5/29 Zosyn x 1  5/29 Vancomycin >> 6/1 5/29 Cefepime >> 6/1 6/01 Meropenem >>c 6/05 daptomycin >>c  Dose adjustments this admission: N/A  Microbiology results: 5/29 BCx: NGTD 5/30 OR cultures: ESBL E. coli (S-imipenem, S-gentamicin) + Rare GPCs (micro lab unable to isolate), GPRs, GNRs 6/5 tissue cx (during VAC change): S aureus, P mirabilis  Thank you for allowing pharmacy to be a part of this patient's care.  Krysteena Stalker Rodriguez-Guzman PharmD, BCPS 04/03/2023 12:23 PM

## 2023-04-03 NOTE — Progress Notes (Signed)
Progress Note    04/03/2023 10:23 AM 1 Day Post-Op  Subjective:  Carla [name] is a 62 year old female who presented to the vein and vascular clinic with an infected left stump status post left above-the-knee amputation 02/21/2023.  Patient returns to clinic on 03/21/23 for evaluation of the left stump.  She is found to have an infection with drainage.  Patient was sent to the emergency room for further workup.  Patient was then taken to the OR on 03/22/2023 for debridement and wound VAC placement.  Patient then had her second wound VAC change on 03/28/2023. Patient had her third wound vac change on 04/02/2023.   This morning in bed patient rests comfortably. She states she is continues to have pain in the left stump area where the wound VAC was placed. I explained to the patient that this is normal at this time. Patient does have prescribed pain medicines. No other complaints. Vitals are remained stable.   Vitals:   04/03/23 0312 04/03/23 0744  BP: 106/79 (!) 98/50  Pulse: 77 77  Resp: 20 18  Temp: 98.3 F (36.8 C) 98.1 F (36.7 C)  SpO2: 95% 94%   Physical Exam: Cardiac:  HX Atrial fibrillation rate in the 90's Lungs:  Clear on auscultation throughout.  Incisions:  Left stump incision dressed with wound vac. Working well Extremities:  Bilateral lower extremity AKA's. Left with infection and now wound vac  Abdomen:  Positive bowel sounds. Right lower quadrant hernia.  Neurologic: AAOX3 follows commands.  CBC    Component Value Date/Time   WBC 6.1 04/02/2023 0400   RBC 3.19 (L) 04/02/2023 0400   HGB 8.6 (L) 04/02/2023 0400   HGB 13.4 02/12/2015 1137   HCT 28.2 (L) 04/02/2023 0400   HCT 40.9 02/12/2015 1137   PLT 276 04/02/2023 0400   PLT 272 02/12/2015 1137   MCV 88.4 04/02/2023 0400   MCV 90 02/12/2015 1137   MCH 27.0 04/02/2023 0400   MCHC 30.5 04/02/2023 0400   RDW 16.5 (H) 04/02/2023 0400   RDW 14.8 (H) 02/12/2015 1137   LYMPHSABS 1.6 02/16/2023 1141   LYMPHSABS 1.7  08/27/2014 1145   MONOABS 0.4 02/16/2023 1141   MONOABS 0.4 08/27/2014 1145   EOSABS 0.3 02/16/2023 1141   EOSABS 0.0 08/27/2014 1145   BASOSABS 0.0 02/16/2023 1141   BASOSABS 0.0 08/27/2014 1145    BMET    Component Value Date/Time   NA 139 04/02/2023 0400   NA 140 02/12/2015 1137   K 4.3 04/02/2023 0400   K 2.9 (L) 02/12/2015 1137   CL 101 04/02/2023 0400   CL 107 02/12/2015 1137   CO2 30 04/02/2023 0400   CO2 27 02/12/2015 1137   GLUCOSE 106 (H) 04/02/2023 0400   GLUCOSE 120 (H) 02/12/2015 1137   BUN 22 04/02/2023 0400   BUN 15 02/12/2015 1137   CREATININE 0.42 (L) 04/02/2023 0400   CREATININE 0.78 02/12/2015 1137   CALCIUM 8.6 (L) 04/02/2023 0400   CALCIUM 9.0 02/12/2015 1137   GFRNONAA >60 04/02/2023 0400   GFRNONAA >60 02/12/2015 1137   GFRAA >60 11/23/2019 1330   GFRAA >60 02/12/2015 1137    INR    Component Value Date/Time   INR 1.6 (H) 03/21/2023 1627   INR 0.8 07/23/2012 0433     Intake/Output Summary (Last 24 hours) at 04/03/2023 1023 Last data filed at 04/03/2023 0613 Gross per 24 hour  Intake 1217 ml  Output 1650 ml  Net -433 ml     Assessment/Plan:  62 y.o. female is s/p Left Lower extremity AKA wound washout with Vac application  1 Day Post-Op   PLAN: Patient will be taken back to the operating room for first wound VAC change on Wednesday, April 05, 2023.  Will most likely entail wound washout and possibly antibiotic with Kerecis placed.  Patient is aware and agrees to the timeline.   DVT prophylaxis:  Eliquis 5 mg twice daily    [name] 04/03/2023 10:23 AM

## 2023-04-03 NOTE — Progress Notes (Signed)
Progress Note   Patient: Carla [name]:[id number] DOB: 09-22-1961 DOA: 03/21/2023     13 DOS: the patient was seen and examined on 04/03/2023   Brief hospital course:  [name] is a 62 year old female with history of depression, anxiety, hyperlipidemia, hypertension, tobacco use, paroxysmal atrial fibrillation on Eliquis, obesity, atherosclerosis, CAD, PAD, history of left stump infection post amputation, who presented on 03/21/2023 from vascular outpatient for due to necrosis of the left AKA stump.   Pt was admitted and started on IV antibiotics. Vascular surgery was consulted and took patient to the OR on 5/30 for debridement and wound vac placement.   6/5 s/p debridement done by vascular surgery 6/10 irrigation and debridement of skin and soft tissue approximately 130 cm to the left AKA stump.  Negative pressure dressing placement    Assessment and Plan:  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 coli and Enterococcus and MRSA needing revision of the amputation site and IV antibiotics followed by oral omadacycline. Left leg irreversible ischemia April 2024 and was in [facility] 02/15/23-02/24/23 needing left above-the-knee amputation on 02/21/2023 She went for a vascular follow up appt on 03/21/23 and they noted the surgical site was necrotic and had purulent discharge. She was asked to get admitted 5/30 - taken to OR for debridement and wound vac placement 6/1 - wound cultures growing ESBL E coli 6/5 s/p debridement and  wound vac change. 6/10 irrigation and debridement of skin and soft tissue approximately 130 cm to the left AKA stump.  Negative pressure dressing placement   --Changed IV antibiotics - Vanc >> Dapto and Cefepime >> Merrem --ID consult for d/c abx recs noted patient will need 4-6 weeks of iv antibiotics with daptomycin and ertapenem. --Pain control  adjusted with scheduled Tylenol, scheduled  Oxycontin, PRN Oxy, PRN IV  dilaudid for breakthrough --Titrate PO pain meds in prep for d/c - wean off IV --Bowel regimen Per vascular surgery, patient will return to the OR for wound VAC change on 04/05/23.  Will likely require wound washout and possibly antibiotic with Kerecis placed.       Hypotension, resolved Likely due to pain meds and infection. Low BP's have led to pain meds needing to be held.   --Reduce metoprolol 50 >> 25 >> 12.5 mg BID --s/p midodrine 5 mg TID, DC'd on 6/5    Hypokalemia Resolved with replacement on 5/31 Monitor BMP, check Mg level   Chronic anticoagulation Eliquis held on admission -resumed   Acute blood loss anemia Hbg on admission was 12.2, trended down post-operatively to 10.4 >> 9.7 >> 9.4>10.2 No signs of active bleeding, some serosanginous drainage in wound vac canister. Anemia panel c/w iron deficiency.  B12 level 234 low, goal >400, started vitamin B12 1000 mcg IM injection daily during hospital stay followed by oral supplement.   continue folic acid 1 mg p.o. daily and PO iron supplement w/Vit C --Monitor CBC   PVD (peripheral vascular disease)  Complicated by bilateral AKA's. Mgmt per vascular surgery   Paroxysmal atrial fibrillation  HR's controlled. Eliquis on hold, resume when okay with vascular. Continue metoprolol   COPD (chronic obstructive pulmonary disease) (HCC) Stable, not exacerbated. Continue bronchodilators PRN   Recurrent major depressive disorder (HCC) Appears no longer on Celexa   Iron deficiency anemia Continue PO iron supplement.   Left thigh pain Due to AKA stump infection, with underlying chronic pain syndrome --Pain control per orders  Anxiety and depression Stable   Adjustment disorder with anxiety Stable   Chronic pain syndrome Continue home Lyrica. Acute pain control as outlined above. Highly suspect hyperalgesia complicating pain control.   GERD (gastroesophageal reflux  disease) Continue PPI   Essential hypertension Continue home Imdur, metoprolol (reduced from 50 >> 25 mg BID due to hypotension)   Pressure injury of skin I agree with the wound description as below. --Continue local wound care --Frequent repositioning --Diligent hygiene --Monitor for signs of infection     Subjective: Patient is seen and examined at the bedside.  Continues to complain of pain over the site of insertion of the wound VAC.  Physical Exam: Vitals:   04/02/23 2340 04/03/23 0312 04/03/23 0744 04/03/23 1211  BP: (!) 101/55 106/79 (!) 98/50 (!) 103/49  Pulse: 79 77 77 73  Resp: 16 20 18 18   Temp: 98.3 F (36.8 C) 98.3 F (36.8 C) 98.1 F (36.7 C) 97.8 F (36.6 C)  TempSrc: Oral     SpO2: 93% 95% 94% 98%  Weight:      Height:       General: NAD, lying comfortably Appear in no distress, affect appropriate Eyes: PERRLA ENT: Oral Mucosa Clear, moist  Neck: no JVD,  Cardiovascular: S1 and S2 Present, no Murmur,  Respiratory: good respiratory effort, Bilateral Air entry equal and Decreased, no Crackles, no wheezes Abdomen: Bowel Sound present, Soft and no tenderness,  Skin: no rashes Extremities: RLE amputation, no calf tenderness, s/p Left AKA, wound VAC attached. Neurologic: without any new focal findings Gait not checked due to patient safety concerns  Data Reviewed: Labs reviewed There are no new results to review at this time.  Family Communication: Plan of care discussed with patient at the bedside.  She verbalizes understanding and agrees to the plan.  Disposition: Status is: Inpatient Remains inpatient appropriate because: On IV antibiotics  Planned Discharge Destination: Skilled nursing facility    Time spent: 35  minutes  Author: Lucile Shutters, MD 04/03/2023 2:30 PM  For on call review [email or web address].

## 2023-04-04 DIAGNOSIS — T8740 Infection of amputation stump, unspecified extremity: Secondary | ICD-10-CM | POA: Diagnosis not present

## 2023-04-04 LAB — BASIC METABOLIC PANEL
Anion gap: 9 (ref 5–15)
BUN: 22 mg/dL (ref 8–23)
CO2: 27 mmol/L (ref 22–32)
Calcium: 7.5 mg/dL — ABNORMAL LOW (ref 8.9–10.3)
Chloride: 104 mmol/L (ref 98–111)
Creatinine, Ser: 0.47 mg/dL (ref 0.44–1.00)
GFR, Estimated: >60 mL/min (ref >60)
Glucose, Bld: 89 mg/dL (ref 70–99)
Potassium: 4.5 mmol/L (ref 3.5–5.1)
Sodium: 140 mmol/L (ref 135–145)

## 2023-04-04 LAB — CBC WITH DIFFERENTIAL/PLATELET
Abs Immature Granulocytes: 0.02 10*3/uL (ref 0.00–0.07)
Basophils Absolute: 0 10*3/uL (ref 0.0–0.1)
Basophils Relative: 0 %
Eosinophils Absolute: 0.2 10*3/uL (ref 0.0–0.5)
Eosinophils Relative: 4 %
HCT: 27.5 % — ABNORMAL LOW (ref 36.0–46.0)
Hemoglobin: 8.1 g/dL — ABNORMAL LOW (ref 12.0–15.0)
Immature Granulocytes: 0 %
Lymphocytes Relative: 25 %
Lymphs Abs: 1.7 10*3/uL (ref 0.7–4.0)
MCH: 26.3 pg (ref 26.0–34.0)
MCHC: 29.5 g/dL — ABNORMAL LOW (ref 30.0–36.0)
MCV: 89.3 fL (ref 80.0–100.0)
Monocytes Absolute: 0.6 10*3/uL (ref 0.1–1.0)
Monocytes Relative: 9 %
Neutro Abs: 4.1 10*3/uL (ref 1.7–7.7)
Neutrophils Relative %: 62 %
Platelets: 275 10*3/uL (ref 150–400)
RBC: 3.08 MIL/uL — ABNORMAL LOW (ref 3.87–5.11)
RDW: 17 % — ABNORMAL HIGH (ref 11.5–15.5)
WBC: 6.6 10*3/uL (ref 4.0–10.5)
nRBC: 0 % (ref 0.0–0.2)

## 2023-04-04 LAB — CK: Total CK: 33 U/L — ABNORMAL LOW (ref 38–234)

## 2023-04-04 MED ORDER — OXYCODONE HCL 5 MG PO TABS
10.0000 mg | ORAL_TABLET | Freq: Once | ORAL | Status: AC
  Administered 2023-04-05: 10 mg via ORAL

## 2023-04-04 NOTE — Progress Notes (Signed)
PROGRESS NOTE    Carla [name]  DVV:[id number] DOB: 06-28-1961 DOA: 03/21/2023 PCP: [facility]    Brief Narrative:  62 year old female with history of depression, anxiety, hyperlipidemia, hypertension, tobacco use, paroxysmal atrial fibrillation on Eliquis, obesity, atherosclerosis, CAD, PAD, history of left stump infection post amputation, who presented on 03/21/2023 from vascular outpatient for due to necrosis of the left AKA stump.   Pt was admitted and started on IV antibiotics. Vascular surgery was consulted and took patient to the OR on 5/30 for debridement and wound vac placement.   6/5 s/p debridement done by vascular surgery 6/10 irrigation and debridement of skin and soft tissue approximately 130 cm to the left AKA stump.  Negative pressure dressing placement   Assessment & Plan:   Principal Problem:   Amputation stump infection (HCC) Active Problems:   Hypotension   Hypokalemia   Chronic anticoagulation   PVD (peripheral vascular disease) (HCC)   Acute blood loss anemia   Paroxysmal atrial fibrillation (HCC)   COPD (chronic obstructive pulmonary disease) (HCC)   Recurrent major depressive disorder (HCC)   Iron deficiency anemia   Essential hypertension   GERD (gastroesophageal reflux disease)   Chronic pain syndrome   Adjustment disorder with anxiety   Anxiety and depression   Pressure injury of skin   Cellulitis of left thigh   Left thigh pain   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 coli and Enterococcus and MRSA needing revision of the amputation site and IV antibiotics followed by oral omadacycline. 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 She was asked to get  admitted 5/30 - taken to OR for debridement and wound vac placement 6/1 - wound cultures growing ESBL E coli 6/5 s/p debridement and  wound vac change. 6/10 irrigation and debridement of skin and soft tissue approximately 130 cm to the left AKA stump.  Negative pressure dressing placement  Plan: Continue broad-spectrum IV antibiotics meropenem and daptomycin.  Per infectious disease will need 4 to 6 weeks.  Multimodal pain control.  Avoid IV narcotic use.  Continue bowel regimen.  Vascular surgery to take patient back to operating room on 6/13 for VAC change.    Hypotension, resolved Likely due to pain meds and infection. Low BP's have led to pain meds needing to be held.   --Reduce metoprolol 50 >> 25 >> 12.5 mg BID --s/p midodrine 5 mg TID, DC'd on 6/5 -Blood pressure stable     Hypokalemia Resolved with replacement on 5/31 Monitor BMP, check Mg level   Chronic anticoagulation Eliquis held on admission -resumed   Acute blood loss anemia Hbg on admission was 12.2, trended down post-operatively to 10.4 >> 9.7 >> 9.4>10.2 No signs of active bleeding, some serosanginous drainage in wound vac canister. Anemia panel c/w iron deficiency.  B12 level 234 low, goal >400, started vitamin B12 1000 mcg IM injection daily during hospital stay followed by oral supplement.   continue folic acid 1 mg p.o. daily and PO iron supplement w/Vit C --Monitor CBC   PVD (peripheral vascular disease)  Complicated by bilateral AKA's. Mgmt per vascular surgery   Paroxysmal atrial fibrillation  HR's controlled. Eliquis on hold, resume when okay with vascular. Continue metoprolol   COPD (chronic obstructive pulmonary  disease) (HCC) Stable, not exacerbated. Continue bronchodilators PRN   Recurrent major depressive disorder (HCC) Appears no longer on Celexa   Iron deficiency anemia Continue PO iron supplement.   Left thigh pain Due to AKA stump infection, with underlying chronic pain  syndrome --Pain control per orders       Anxiety and depression Stable   Adjustment disorder with anxiety Stable   Chronic pain syndrome Continue home Lyrica. Acute pain control as outlined above. Highly suspect hyperalgesia complicating pain control.   GERD (gastroesophageal reflux disease) Continue PPI   Essential hypertension Continue home Imdur, metoprolol (reduced from 50 >> 25 mg BID due to hypotension)   Pressure injury of skin I agree with the wound description as below. --Continue local wound care --Frequent repositioning --Diligent hygiene --Monitor for signs of infection     DVT prophylaxis: Eliquis Code Status: Full Family Communication: None Disposition Plan: Status is: Inpatient Remains inpatient appropriate because: Acute medical issues as above     Level of care: Med-Surg  Consultants:  Vascular surgery Infectious disease  Procedures:  Debridement and wound VAC change on right lower extremity stump   Antimicrobials: Daptomycin Meropenem   Subjective: Seen and examined.  Tearful.  Complains of pain  Objective: Vitals:   04/03/23 2009 04/03/23 2250 04/04/23 0638 04/04/23 0728  BP: (!) 93/45 (!) 120/48 (!) 105/59 97/61  Pulse: 85 79 67 73  Resp: 20 18 20 18   Temp: 97.8 F (36.6 C) 98 F (36.7 C) 97.8 F (36.6 C) 98 F (36.7 C)  TempSrc: Oral Oral Oral   SpO2: 92% 97% 98% 98%  Weight:      Height:        Intake/Output Summary (Last 24 hours) at 04/04/2023 1556 Last data filed at 04/04/2023 0728 Gross per 24 hour  Intake 580.54 ml  Output 900 ml  Net -319.46 ml   Filed Weights   03/21/23 1410 03/22/23 1006 03/28/23 0953  Weight: 81.6 kg 81.6 kg 79.4 kg    Examination:  General exam: Tearful.  Labile mood Respiratory system: Clear to auscultation. Respiratory effort normal.  Room air Cardiovascular system: S1 & S2 heard, RRR. No JVD, murmurs, rubs, gallops or clicks. No pedal edema. Gastrointestinal system: Soft,  NT/ND, normal bowel sounds Central nervous system: Alert and oriented. No focal neurological deficits. Extremities: Bilateral AKA Skin: No rashes, lesions or ulcers Psychiatry: Judgement and insight appear impaired. Mood & affect tearful.     Data Reviewed: I have personally reviewed following labs and imaging studies  CBC: Recent Labs  Lab 03/30/23 0404 03/31/23 0410 04/01/23 0346 04/02/23 0400 04/04/23 0502  WBC 6.5 5.0 4.7 6.1 6.6  NEUTROABS  --   --   --   --  4.1  HGB 8.6* 8.7* 8.3* 8.6* 8.1*  HCT 28.2* 28.8* 28.0* 28.2* 27.5*  MCV 88.4 89.4 89.2 88.4 89.3  PLT 268 278 250 276 275   Basic Metabolic Panel: Recent Labs  Lab 03/29/23 0645 03/30/23 0404 03/31/23 0410 04/01/23 0346 04/02/23 0400 04/04/23 0502  NA 141 140 138 139 139 140  K 4.3 4.3 4.7 4.2 4.3 4.5  CL 100 102 99 101 101 104  CO2 30 29 29 30 30 27   GLUCOSE 125* 113* 120* 131* 106* 89  BUN 13 17 17 22 22 22   CREATININE 0.40* 0.37* 0.44 0.45 0.42* 0.47  CALCIUM 9.5 8.6* 8.8* 8.4* 8.6* 7.5*  MG 2.5* 2.4 2.5*  --   --   --   PHOS 4.3 4.2  5.0*  --   --   --    GFR: Estimated Creatinine Clearance: 70.2 mL/min (by C-G formula based on SCr of 0.47 mg/dL). Liver Function Tests: No results for input(s): "AST", "ALT", "ALKPHOS", "BILITOT", "PROT", "ALBUMIN" in the last 168 hours. No results for input(s): "LIPASE", "AMYLASE" in the last 168 hours. No results for input(s): "AMMONIA" in the last 168 hours. Coagulation Profile: No results for input(s): "INR", "PROTIME" in the last 168 hours. Cardiac Enzymes: Recent Labs  Lab 03/28/23 1623 04/04/23 0502  CKTOTAL 14* 33*   BNP (last 3 results) No results for input(s): "PROBNP" in the last 8760 hours. HbA1C: No results for input(s): "HGBA1C" in the last 72 hours. CBG: No results for input(s): "GLUCAP" in the last 168 hours. Lipid Profile: No results for input(s): "CHOL", "HDL", "LDLCALC", "TRIG", "CHOLHDL", "LDLDIRECT" in the last 72 hours. Thyroid  Function Tests: No results for input(s): "TSH", "T4TOTAL", "FREET4", "T3FREE", "THYROIDAB" in the last 72 hours. Anemia Panel: No results for input(s): "VITAMINB12", "FOLATE", "FERRITIN", "TIBC", "IRON", "RETICCTPCT" in the last 72 hours. Sepsis Labs: No results for input(s): "PROCALCITON", "LATICACIDVEN" in the last 168 hours.  Recent Results (from the past 240 hour(s))  Aerobic/Anaerobic Culture w Gram Stain (surgical/deep wound)     Status: None   Collection Time: 03/28/23 10:59 AM   Specimen: Path Tissue  Result Value Ref Range Status   Specimen Description   Final    TISSUE Performed at [facility], [address] [place], [place] [address]    Special Requests SWAB OF LEFT LEG STUMP  Final   Gram Stain NO WBC SEEN NO ORGANISMS SEEN   Final   Culture   Final    RARE STAPHYLOCOCCUS AUREUS RARE PROTEUS MIRABILIS NO ANAEROBES ISOLATED Performed at [facility], [address], [place], [place] [address]    Report Status 04/02/2023 FINAL  Final   Organism ID, Bacteria STAPHYLOCOCCUS AUREUS  Final   Organism ID, Bacteria PROTEUS MIRABILIS  Final      Susceptibility   Proteus mirabilis - MIC*    AMPICILLIN <=2 SENSITIVE Sensitive     CEFEPIME <=0.12 SENSITIVE Sensitive     CEFTAZIDIME <=1 SENSITIVE Sensitive     CEFTRIAXONE <=0.25 SENSITIVE Sensitive     CIPROFLOXACIN <=0.25 SENSITIVE Sensitive     GENTAMICIN <=1 SENSITIVE Sensitive     IMIPENEM 2 SENSITIVE Sensitive     TRIMETH/SULFA <=20 SENSITIVE Sensitive     AMPICILLIN/SULBACTAM <=2 SENSITIVE Sensitive     PIP/TAZO <=4 SENSITIVE Sensitive     * RARE PROTEUS MIRABILIS   Staphylococcus aureus - MIC*    CIPROFLOXACIN >=8 RESISTANT Resistant     ERYTHROMYCIN >=8 RESISTANT Resistant     GENTAMICIN <=0.5 SENSITIVE Sensitive     OXACILLIN >=4 RESISTANT Resistant     TETRACYCLINE >=16 RESISTANT Resistant     VANCOMYCIN 1 SENSITIVE Sensitive     TRIMETH/SULFA <=10 SENSITIVE Sensitive     CLINDAMYCIN >=8  RESISTANT Resistant     RIFAMPIN <=0.5 SENSITIVE Sensitive     Inducible Clindamycin NEGATIVE Sensitive     LINEZOLID 2 SENSITIVE Sensitive     * RARE STAPHYLOCOCCUS AUREUS         Radiology Studies: No results found.      Scheduled Meds:  acetaminophen  1,000 mg Oral Q8H   apixaban  5 mg Oral BID   ascorbic acid  500 mg Oral BID   atorvastatin  40 mg Oral QHS   vitamin B-12  1,000 mcg Oral Daily   feeding supplement  237 mL Oral BID BM   ferrous sulfate  325 mg Oral Q breakfast   folic acid  1 mg Oral Daily   isosorbide mononitrate  120 mg Oral Daily   metoprolol tartrate  12.5 mg Oral BID   nystatin cream   Topical BID   oxyCODONE  10 mg Oral Once   oxyCODONE  30 mg Oral Q12H   pantoprazole  40 mg Oral Daily   polyethylene glycol  17 g Oral Daily   pregabalin  200 mg Oral TID   senna-docusate  1 tablet Oral BID   Vitamin D (Ergocalciferol)  50,000 Units Oral Q7 days   zinc sulfate  220 mg Oral Daily   Continuous Infusions:  DAPTOmycin (CUBICIN) 500 mg in sodium chloride 0.9 % IVPB 500 mg (04/04/23 1417)   meropenem (MERREM) IV 200 mL/hr at 04/04/23 1338     LOS: 14 days    Tresa Moore, MD [facility]   If 7PM-7AM, please contact night-coverage  04/04/2023, 3:56 PM

## 2023-04-04 NOTE — H&P (View-Only) (Signed)
Progress Note    04/04/2023 10:57 AM 2 Days Post-Op  Subjective:   Carla [name] is a 62-year-old female who presented to the vein and vascular clinic with an infected left stump status post left above-the-knee amputation 02/21/2023.  Patient returns to clinic on 03/21/23 for evaluation of the left stump.  She is found to have an infection with drainage.  Patient was sent to the emergency room for further workup.  Patient was then taken to the OR on 03/22/2023 for debridement and wound VAC placement.  Patient then had her second wound VAC change on 03/28/2023. Patient had her third wound vac change on 04/02/2023.   Patient is resting comfortably in bed this morning. She continues to endorse strong 7-8 out of 10 pain where the wound vac is placed. No other complaints overnight. Vitals all remain stable.    Vitals:   04/04/23 0638 04/04/23 0728  BP: (!) 105/59 97/61  Pulse: 67 73  Resp: 20 18  Temp: 97.8 F (36.6 C) 98 F (36.7 C)  SpO2: 98% 98%   Physical Exam: Cardiac:  HX Atrial fibrillation rate in the 90's Lungs:  Clear on auscultation throughout.  Incisions:  Left stump incision dressed with wound vac. Working well Extremities:  Bilateral lower extremity AKA's. Left with infection and now wound vac  Abdomen:  Positive bowel sounds. Right lower quadrant hernia.  Neurologic: AAOX3 follows commands.  CBC    Component Value Date/Time   WBC 6.6 04/04/2023 0502   RBC 3.08 (L) 04/04/2023 0502   HGB 8.1 (L) 04/04/2023 0502   HGB 13.4 02/12/2015 1137   HCT 27.5 (L) 04/04/2023 0502   HCT 40.9 02/12/2015 1137   PLT 275 04/04/2023 0502   PLT 272 02/12/2015 1137   MCV 89.3 04/04/2023 0502   MCV 90 02/12/2015 1137   MCH 26.3 04/04/2023 0502   MCHC 29.5 (L) 04/04/2023 0502   RDW 17.0 (H) 04/04/2023 0502   RDW 14.8 (H) 02/12/2015 1137   LYMPHSABS 1.7 04/04/2023 0502   LYMPHSABS 1.7 08/27/2014 1145   MONOABS 0.6 04/04/2023 0502   MONOABS 0.4 08/27/2014 1145   EOSABS 0.2 04/04/2023  0502   EOSABS 0.0 08/27/2014 1145   BASOSABS 0.0 04/04/2023 0502   BASOSABS 0.0 08/27/2014 1145    BMET    Component Value Date/Time   NA 140 04/04/2023 0502   NA 140 02/12/2015 1137   K 4.5 04/04/2023 0502   K 2.9 (L) 02/12/2015 1137   CL 104 04/04/2023 0502   CL 107 02/12/2015 1137   CO2 27 04/04/2023 0502   CO2 27 02/12/2015 1137   GLUCOSE 89 04/04/2023 0502   GLUCOSE 120 (H) 02/12/2015 1137   BUN 22 04/04/2023 0502   BUN 15 02/12/2015 1137   CREATININE 0.47 04/04/2023 0502   CREATININE 0.78 02/12/2015 1137   CALCIUM 7.5 (L) 04/04/2023 0502   CALCIUM 9.0 02/12/2015 1137   GFRNONAA >60 04/04/2023 0502   GFRNONAA >60 02/12/2015 1137   GFRAA >60 11/23/2019 1330   GFRAA >60 02/12/2015 1137    INR    Component Value Date/Time   INR 1.6 (H) 03/21/2023 1627   INR 0.8 07/23/2012 0433     Intake/Output Summary (Last 24 hours) at 04/04/2023 1057 Last data filed at 04/04/2023 0728 Gross per 24 hour  Intake 840.42 ml  Output 900 ml  Net -59.58 ml     Assessment/Plan:  62 y.o. female is s/p Left Lower extremity AKA wound washout with Vac re-application  2 Days   Post-Op   PLAN: Patient will be taken back to the operating room for first wound VAC change on Thursday, April 05, 2023.  Will most likely entail wound washout and possibly antibiotic with Kerecis placed.  Patient is aware and agrees to the timeline.   DVT prophylaxis:  Eliquis 5 mg twice daily   [name] R Andree [facility] 04/04/2023 10:57 AM   

## 2023-04-04 NOTE — TOC Progression Note (Signed)
Transition of Care [facility]) - Progression Note    Patient Details  Name: Carla [name] MRN: [id number] Date of Birth: 08-02-1961  Transition of [facility]) CM/SW Contact  Allena Katz, LCSW Phone Number: 04/04/2023, 2:21 PM  Clinical Narrative:   Per RN patient going for L knee debridement. Per RN pt does not have support for wound vac and antibiotics at home and feels she may be better served in a facility. CSW will speak with patient about this.    Expected Discharge Plan: Home w Home Health Services Barriers to Discharge: Continued Medical Work up  Expected Discharge Plan and Services     Post Acute Care Choice: Resumption of Svcs/PTA Provider Living arrangements for the past 2 months: Single Family Home                           HH Arranged: RN, PT, OT [facility] Agency: [facility] Date HH Agency Contacted: 03/22/23   Representative spoke with at [facility] Agency: Feliberto Gottron   Social Determinants of Health (SDOH) Interventions SDOH Screenings   Food Insecurity: No Food Insecurity (03/22/2023)  Housing: Low Risk  (03/22/2023)  Transportation Needs: No Transportation Needs (03/22/2023)  Utilities: Not At Risk (03/22/2023)  Depression (PHQ2-9): Low Risk  (01/25/2023)  Tobacco Use: Medium Risk (04/03/2023)    Readmission Risk Interventions    03/23/2023   12:17 PM 12/06/2022    1:31 PM 07/25/2022   10:49 AM  Readmission Risk Prevention Plan  Transportation Screening Complete Complete Complete  Medication Review Oceanographer) Complete Complete Complete  PCP or Specialist appointment within 3-5 days of discharge Complete  Complete  HRI or Home Care Consult Complete  Complete  SW Recovery Care/Counseling Consult Complete Complete Not Complete  SW Consult Not Complete Comments   NA  Palliative Care Screening Not Applicable Not Applicable   Skilled Nursing Facility Not Applicable  Patient Refused

## 2023-04-04 NOTE — Progress Notes (Signed)
Progress Note    04/04/2023 10:57 AM 2 Days Post-Op  Subjective:   Carla [name] is a 62 year old female who presented to the vein and vascular clinic with an infected left stump status post left above-the-knee amputation 02/21/2023.  Patient returns to clinic on 03/21/23 for evaluation of the left stump.  She is found to have an infection with drainage.  Patient was sent to the emergency room for further workup.  Patient was then taken to the OR on 03/22/2023 for debridement and wound VAC placement.  Patient then had her second wound VAC change on 03/28/2023. Patient had her third wound vac change on 04/02/2023.   Patient is resting comfortably in bed this morning. She continues to endorse strong 7-8 out of 10 pain where the wound vac is placed. No other complaints overnight. Vitals all remain stable.    Vitals:   04/04/23 0638 04/04/23 0728  BP: (!) 105/59 97/61  Pulse: 67 73  Resp: 20 18  Temp: 97.8 F (36.6 C) 98 F (36.7 C)  SpO2: 98% 98%   Physical Exam: Cardiac:  HX Atrial fibrillation rate in the 90's Lungs:  Clear on auscultation throughout.  Incisions:  Left stump incision dressed with wound vac. Working well Extremities:  Bilateral lower extremity AKA's. Left with infection and now wound vac  Abdomen:  Positive bowel sounds. Right lower quadrant hernia.  Neurologic: AAOX3 follows commands.  CBC    Component Value Date/Time   WBC 6.6 04/04/2023 0502   RBC 3.08 (L) 04/04/2023 0502   HGB 8.1 (L) 04/04/2023 0502   HGB 13.4 02/12/2015 1137   HCT 27.5 (L) 04/04/2023 0502   HCT 40.9 02/12/2015 1137   PLT 275 04/04/2023 0502   PLT 272 02/12/2015 1137   MCV 89.3 04/04/2023 0502   MCV 90 02/12/2015 1137   MCH 26.3 04/04/2023 0502   MCHC 29.5 (L) 04/04/2023 0502   RDW 17.0 (H) 04/04/2023 0502   RDW 14.8 (H) 02/12/2015 1137   LYMPHSABS 1.7 04/04/2023 0502   LYMPHSABS 1.7 08/27/2014 1145   MONOABS 0.6 04/04/2023 0502   MONOABS 0.4 08/27/2014 1145   EOSABS 0.2 04/04/2023  0502   EOSABS 0.0 08/27/2014 1145   BASOSABS 0.0 04/04/2023 0502   BASOSABS 0.0 08/27/2014 1145    BMET    Component Value Date/Time   NA 140 04/04/2023 0502   NA 140 02/12/2015 1137   K 4.5 04/04/2023 0502   K 2.9 (L) 02/12/2015 1137   CL 104 04/04/2023 0502   CL 107 02/12/2015 1137   CO2 27 04/04/2023 0502   CO2 27 02/12/2015 1137   GLUCOSE 89 04/04/2023 0502   GLUCOSE 120 (H) 02/12/2015 1137   BUN 22 04/04/2023 0502   BUN 15 02/12/2015 1137   CREATININE 0.47 04/04/2023 0502   CREATININE 0.78 02/12/2015 1137   CALCIUM 7.5 (L) 04/04/2023 0502   CALCIUM 9.0 02/12/2015 1137   GFRNONAA >60 04/04/2023 0502   GFRNONAA >60 02/12/2015 1137   GFRAA >60 11/23/2019 1330   GFRAA >60 02/12/2015 1137    INR    Component Value Date/Time   INR 1.6 (H) 03/21/2023 1627   INR 0.8 07/23/2012 0433     Intake/Output Summary (Last 24 hours) at 04/04/2023 1057 Last data filed at 04/04/2023 1610 Gross per 24 hour  Intake 840.42 ml  Output 900 ml  Net -59.58 ml     Assessment/Plan:  62 y.o. female is s/p Left Lower extremity AKA wound washout with Vac re-application  2 Days  Post-Op   PLAN: Patient will be taken back to the operating room for first wound VAC change on Thursday, April 05, 2023.  Will most likely entail wound washout and possibly antibiotic with Kerecis placed.  Patient is aware and agrees to the timeline.   DVT prophylaxis:  Eliquis 5 mg twice daily   [name] [facility] 04/04/2023 10:57 AM

## 2023-04-05 ENCOUNTER — Encounter
Admission: EM | Disposition: A | Discharge status: Home / Self Care | Payer: Self-pay | Source: Home / Self Care | Attending: Internal Medicine

## 2023-04-05 ENCOUNTER — Inpatient Hospital Stay: Payer: 59 | Admitting: Registered Nurse

## 2023-04-05 ENCOUNTER — Other Ambulatory Visit: Payer: Self-pay

## 2023-04-05 DIAGNOSIS — B9562 Methicillin resistant Staphylococcus aureus infection as the cause of diseases classified elsewhere: Secondary | ICD-10-CM | POA: Diagnosis not present

## 2023-04-05 DIAGNOSIS — A4902 Methicillin resistant Staphylococcus aureus infection, unspecified site: Secondary | ICD-10-CM

## 2023-04-05 DIAGNOSIS — B964 Proteus (mirabilis) (morganii) as the cause of diseases classified elsewhere: Secondary | ICD-10-CM | POA: Diagnosis not present

## 2023-04-05 DIAGNOSIS — A498 Other bacterial infections of unspecified site: Secondary | ICD-10-CM

## 2023-04-05 DIAGNOSIS — B9620 Unspecified Escherichia coli [E. coli] as the cause of diseases classified elsewhere: Secondary | ICD-10-CM | POA: Diagnosis not present

## 2023-04-05 DIAGNOSIS — T8744 Infection of amputation stump, left lower extremity: Secondary | ICD-10-CM | POA: Diagnosis not present

## 2023-04-05 DIAGNOSIS — T8740 Infection of amputation stump, unspecified extremity: Secondary | ICD-10-CM | POA: Diagnosis not present

## 2023-04-05 HISTORY — PX: APPLICATION OF WOUND VAC: SHX5189

## 2023-04-05 SURGERY — APPLICATION, WOUND VAC
Anesthesia: General | Laterality: Left

## 2023-04-05 MED ORDER — CEFAZOLIN SODIUM-DEXTROSE 2-4 GM/100ML-% IV SOLN
INTRAVENOUS | Status: AC
  Filled 2023-04-05: qty 100

## 2023-04-05 MED ORDER — PHENYLEPHRINE HCL (PRESSORS) 10 MG/ML IV SOLN
INTRAVENOUS | Status: DC | PRN
  Administered 2023-04-05: 80 ug via INTRAVENOUS
  Administered 2023-04-05: 100 ug via INTRAVENOUS

## 2023-04-05 MED ORDER — FENTANYL CITRATE (PF) 100 MCG/2ML IJ SOLN
INTRAMUSCULAR | Status: DC | PRN
  Administered 2023-04-05: 25 ug via INTRAVENOUS

## 2023-04-05 MED ORDER — FENTANYL CITRATE (PF) 100 MCG/2ML IJ SOLN
INTRAMUSCULAR | Status: AC
  Filled 2023-04-05: qty 2

## 2023-04-05 MED ORDER — CHLORHEXIDINE GLUCONATE 4 % EX SOLN
60.0000 mL | Freq: Once | CUTANEOUS | Status: AC
  Administered 2023-04-05: 4 via TOPICAL

## 2023-04-05 MED ORDER — FENTANYL CITRATE (PF) 100 MCG/2ML IJ SOLN
25.0000 ug | INTRAMUSCULAR | Status: AC | PRN
  Administered 2023-04-05 (×8): 25 ug via INTRAVENOUS

## 2023-04-05 MED ORDER — CEFAZOLIN SODIUM-DEXTROSE 2-4 GM/100ML-% IV SOLN
2.0000 g | INTRAVENOUS | Status: AC
  Administered 2023-04-05: 2 g via INTRAVENOUS

## 2023-04-05 MED ORDER — PROPOFOL 500 MG/50ML IV EMUL
INTRAVENOUS | Status: DC | PRN
  Administered 2023-04-05: 100 ug/kg/min via INTRAVENOUS

## 2023-04-05 MED ORDER — KETOROLAC TROMETHAMINE 30 MG/ML IJ SOLN
30.0000 mg | Freq: Once | INTRAMUSCULAR | Status: AC
  Administered 2023-04-05: 30 mg via INTRAVENOUS
  Filled 2023-04-05: qty 1

## 2023-04-05 MED ORDER — PROPOFOL 1000 MG/100ML IV EMUL
INTRAVENOUS | Status: AC
  Filled 2023-04-05: qty 100

## 2023-04-05 MED ORDER — ONDANSETRON HCL 4 MG/2ML IJ SOLN
INTRAMUSCULAR | Status: AC
  Filled 2023-04-05: qty 2

## 2023-04-05 MED ORDER — KETOROLAC TROMETHAMINE 15 MG/ML IJ SOLN
15.0000 mg | Freq: Four times a day (QID) | INTRAMUSCULAR | Status: AC | PRN
  Administered 2023-04-05 – 2023-04-10 (×10): 15 mg via INTRAVENOUS
  Filled 2023-04-05 (×10): qty 1

## 2023-04-05 MED ORDER — ONDANSETRON HCL 4 MG/2ML IJ SOLN: 4.0000 mg | Freq: Once | INTRAMUSCULAR | Status: DC | PRN

## 2023-04-05 MED ORDER — FAMOTIDINE 20 MG PO TABS: 40.0000 mg | ORAL_TABLET | Freq: Once | ORAL | Status: DC | PRN

## 2023-04-05 MED ORDER — DIPHENHYDRAMINE HCL 50 MG/ML IJ SOLN: 50.0000 mg | Freq: Once | INTRAMUSCULAR | Status: DC | PRN

## 2023-04-05 MED ORDER — MIDAZOLAM HCL 2 MG/2ML IJ SOLN
INTRAMUSCULAR | Status: DC | PRN
  Administered 2023-04-05 (×2): 1 mg via INTRAVENOUS

## 2023-04-05 MED ORDER — METHYLPREDNISOLONE SODIUM SUCC 125 MG IJ SOLR: 125.0000 mg | Freq: Once | INTRAMUSCULAR | Status: DC | PRN

## 2023-04-05 MED ORDER — MIDODRINE HCL 5 MG PO TABS
10.0000 mg | ORAL_TABLET | Freq: Once | ORAL | Status: AC
  Administered 2023-04-05: 10 mg via ORAL
  Filled 2023-04-05: qty 2

## 2023-04-05 MED ORDER — SODIUM CHLORIDE 0.9 % IV SOLN: INTRAVENOUS | Status: DC

## 2023-04-05 MED ORDER — PHENYLEPHRINE 80 MCG/ML (10ML) SYRINGE FOR IV PUSH (FOR BLOOD PRESSURE SUPPORT)
PREFILLED_SYRINGE | INTRAVENOUS | Status: AC
  Filled 2023-04-05: qty 10

## 2023-04-05 MED ORDER — MIDAZOLAM HCL 2 MG/ML PO SYRP: 8.0000 mg | ORAL_SOLUTION | Freq: Once | ORAL | Status: DC | PRN

## 2023-04-05 MED ORDER — FENTANYL CITRATE PF 50 MCG/ML IJ SOSY: 12.5000 ug | PREFILLED_SYRINGE | Freq: Once | INTRAMUSCULAR | Status: DC | PRN

## 2023-04-05 MED ORDER — ONDANSETRON HCL 4 MG/2ML IJ SOLN: 4.0000 mg | Freq: Four times a day (QID) | INTRAMUSCULAR | Status: DC | PRN

## 2023-04-05 MED ORDER — HYDROMORPHONE HCL 1 MG/ML IJ SOLN
1.0000 mg | Freq: Once | INTRAMUSCULAR | Status: AC | PRN
  Administered 2023-04-05: 1 mg via INTRAVENOUS
  Filled 2023-04-05: qty 1

## 2023-04-05 MED ORDER — PROPOFOL 10 MG/ML IV BOLUS
INTRAVENOUS | Status: DC | PRN
  Administered 2023-04-05: 20 mg via INTRAVENOUS
  Administered 2023-04-05: 40 mg via INTRAVENOUS
  Administered 2023-04-05: 20 mg via INTRAVENOUS

## 2023-04-05 MED ORDER — CHLORHEXIDINE GLUCONATE 4 % EX SOLN: 60.0000 mL | Freq: Once | CUTANEOUS | Status: DC

## 2023-04-05 MED ORDER — 0.9 % SODIUM CHLORIDE (POUR BTL) OPTIME
TOPICAL | Status: DC | PRN
  Administered 2023-04-05: 500 mL

## 2023-04-05 MED ORDER — ONDANSETRON HCL 4 MG/2ML IJ SOLN
INTRAMUSCULAR | Status: DC | PRN
  Administered 2023-04-05: 4 mg via INTRAVENOUS

## 2023-04-05 MED ORDER — MIDAZOLAM HCL 2 MG/2ML IJ SOLN
INTRAMUSCULAR | Status: AC
  Filled 2023-04-05: qty 2

## 2023-04-05 SURGICAL SUPPLY — 23 items
CANISTER WOUND CARE 500ML ATS (WOUND CARE) ×1 IMPLANT
DRAPE INCISE IOBAN 66X45 STRL (DRAPES) ×1 IMPLANT
DRAPE LAPAROTOMY 100X77 ABD (DRAPES) ×1 IMPLANT
DRSG VAC GRANUFOAM LG (GAUZE/BANDAGES/DRESSINGS) ×1 IMPLANT
DRSG VAC GRANUFOAM MED (GAUZE/BANDAGES/DRESSINGS) ×1 IMPLANT
ELECT REM PT RETURN 9FT ADLT (ELECTROSURGICAL)
ELECTRODE REM PT RTRN 9FT ADLT (ELECTROSURGICAL) ×1 IMPLANT
GAUZE 4X4 16PLY ~~LOC~~+RFID DBL (SPONGE) ×1 IMPLANT
GLOVE BIO SURGEON STRL SZ7 (GLOVE) ×2 IMPLANT
GOWN STRL REUS W/ TWL LRG LVL3 (GOWN DISPOSABLE) ×2 IMPLANT
GOWN STRL REUS W/ TWL XL LVL3 (GOWN DISPOSABLE) ×1 IMPLANT
GOWN STRL REUS W/TWL LRG LVL3 (GOWN DISPOSABLE) ×2
GOWN STRL REUS W/TWL XL LVL3 (GOWN DISPOSABLE) ×1
GRAFT SKIN WND MICRO 38 (Tissue) IMPLANT
KIT TURNOVER KIT A (KITS) ×1 IMPLANT
MANIFOLD NEPTUNE II (INSTRUMENTS) ×1 IMPLANT
NS IRRIG 1000ML POUR BTL (IV SOLUTION) ×1 IMPLANT
PACK BASIN MINOR ARMC (MISCELLANEOUS) ×1 IMPLANT
SOL PREP PVP 2OZ (MISCELLANEOUS) ×1
SOLUTION PREP PVP 2OZ (MISCELLANEOUS) ×1 IMPLANT
SPONGE T-LAP 18X18 ~~LOC~~+RFID (SPONGE) ×1 IMPLANT
TRAP FLUID SMOKE EVACUATOR (MISCELLANEOUS) ×1 IMPLANT
WATER STERILE IRR 500ML POUR (IV SOLUTION) ×1 IMPLANT

## 2023-04-05 NOTE — Progress Notes (Signed)
Date of Admission:  03/21/2023      ID: Carla [name] is a 62 y.o. female  Principal Problem:   Amputation stump infection (HCC) Active Problems:   Essential hypertension   GERD (gastroesophageal reflux disease)   Chronic pain syndrome   Recurrent major depressive disorder (HCC)   Hypotension   Adjustment disorder with anxiety   Paroxysmal atrial fibrillation (HCC)   COPD (chronic obstructive pulmonary disease) (HCC)   PVD (peripheral vascular disease) (HCC)   Hypokalemia   Chronic anticoagulation   Anxiety and depression   Acute blood loss anemia   Iron deficiency anemia   Pressure injury of skin   Cellulitis of left thigh   Left thigh pain    Subjective:  C/o pan left stump  Medications:   acetaminophen  1,000 mg Oral Q8H   apixaban  5 mg Oral BID   ascorbic acid  500 mg Oral BID   atorvastatin  40 mg Oral QHS   [START ON 04/06/2023] chlorhexidine  60 mL Topical Once   vitamin B-12  1,000 mcg Oral Daily   feeding supplement  237 mL Oral BID BM   ferrous sulfate  325 mg Oral Q breakfast   folic acid  1 mg Oral Daily   isosorbide mononitrate  120 mg Oral Daily   metoprolol tartrate  12.5 mg Oral BID   midodrine  10 mg Oral Once   nystatin cream   Topical BID   oxyCODONE  10 mg Oral Once   oxyCODONE  30 mg Oral Q12H   pantoprazole  40 mg Oral Daily   polyethylene glycol  17 g Oral Daily   pregabalin  200 mg Oral TID   senna-docusate  1 tablet Oral BID   Vitamin D (Ergocalciferol)  50,000 Units Oral Q7 days   zinc sulfate  220 mg Oral Daily    Objective: Vital signs in last 24 hours: Patient Vitals for the past 24 hrs:  BP Temp Temp src Pulse Resp SpO2  04/05/23 1648 (!) 90/36 98 F (36.7 C) -- 70 18 92 %  04/05/23 1324 (!) 112/45 -- -- 73 18 99 %  04/05/23 1300 115/60 (!) 97.5 F (36.4 C) -- 71 11 100 %  04/05/23 1245 (!) 136/55 -- -- 74 16 99 %  04/05/23 1230 129/82 97.6 F (36.4 C) -- 79 13 97 %  04/05/23 1215 (!) 109/59 -- -- 82 18 96 %   04/05/23 1200 (!) 104/46 (!) 97.5 F (36.4 C) -- 76 12 100 %  04/05/23 1018 (!) 101/49 98.8 F (37.1 C) Temporal 67 16 96 %  04/05/23 0820 (!) 93/41 98.3 F (36.8 C) -- 70 18 97 %  04/05/23 0528 (!) 85/54 98.3 F (36.8 C) -- 69 18 92 %  04/04/23 2138 121/69 98.4 F (36.9 C) -- 81 18 97 %     PHYSICAL EXAM:  General: Alert, cooperative,  Lungs: Clear to auscultation bilaterally. No Wheezing or Rhonchi. No rales. Heart:s1s2 Abdomen: Soft, non-tender,not distended. Bowel sounds normal. No masses Extremities: b/l AKA- - Left stump- not closed- wound clean     R stump  small wound almost closed Skin: No rashes or lesions. Or bruising Lymph: Cervical, supraclavicular normal. Neurologic: Grossly non-focal  Lab Results    Latest Ref Rng & Units 04/04/2023    5:02 AM 04/02/2023    4:00 AM 04/01/2023    3:46 AM  CBC  WBC 4.0 - 10.5 K/uL 6.6  6.1  4.7  Hemoglobin 12.0 - 15.0 g/dL 8.1  8.6  8.3   Hematocrit 36.0 - 46.0 % 27.5  28.2  28.0   Platelets 150 - 400 K/uL 275  276  250        Latest Ref Rng & Units 04/04/2023    5:02 AM 04/02/2023    4:00 AM 04/01/2023    3:46 AM  CMP  Glucose 70 - 99 mg/dL 89  725  366   BUN 8 - 23 mg/dL 22  22  22    Creatinine 0.44 - 1.00 mg/dL 4.40  3.47  4.25   Sodium 135 - 145 mmol/L 140  139  139   Potassium 3.5 - 5.1 mmol/L 4.5  4.3  4.2   Chloride 98 - 111 mmol/L 104  101  101   CO2 22 - 32 mmol/L 27  30  30    Calcium 8.9 - 10.3 mg/dL 7.5  8.6  8.4       Microbiology: Left AKA stump  surgical wound has ESBL ecoli and proteus in the wound MRSA     Assessment/Plan: 62 y.o. female with a history of A-fib, CHF, PAD, COPD, HLD, hypertension, right AKA, left leg irreversible ischemia April 2024 and was in [facility] 02/15/23-02/24/23 needing left above-the-knee amputation on 02/21/2023 She went for a vascular follow up appt on 03/21/23 and they noted the surgical site was necrotic and had purulent discharge. She was asked to get admitted   Left AKA  stump infection- ESBL ecoli and proteus and MRSA  in the  wound Currently on meropenem and daptomycin   She is going to need Iv antibitcs for 4 -6 weeks Will need IV antibiotic ( ertapenem and dapto on discharge) would need until 05/04/23   Rt AKA stump= wound Is very small and almost closed Had chronic infection for 3 months ( MRSA, ESBL, enterococcus) and took many courses of antibiotics   Current smoker Severe PAD with stents   CAD Imdur Metoprolol atorvastatin   HTN? ? AFIB controlled- on eliquis   Anemia    Discussed the management with the patient and care team

## 2023-04-05 NOTE — Anesthesia Preprocedure Evaluation (Signed)
Anesthesia Evaluation  Patient identified by MRN, date of birth, ID band Patient awake    Reviewed: Allergy & Precautions, NPO status , Patient's Chart, lab work & pertinent test results  Airway Mallampati: III  TM Distance: >3 FB Neck ROM: full    Dental  (+) Edentulous Upper, Edentulous Lower   Pulmonary neg pulmonary ROS, shortness of breath and with exertion, COPD,  COPD inhaler, former smoker   Pulmonary exam normal  + decreased breath sounds      Cardiovascular Exercise Tolerance: Poor hypertension, Pt. on medications + angina  + CAD, + Past MI, + Peripheral Vascular Disease and +CHF  negative cardio ROS Normal cardiovascular exam Rhythm:Regular Rate:Normal     Neuro/Psych   Anxiety Depression    negative neurological ROS  negative psych ROS   GI/Hepatic negative GI ROS, Neg liver ROS,GERD  Medicated,,  Endo/Other  negative endocrine ROS  Morbid obesity  Renal/GU      Musculoskeletal   Abdominal  (+) + obese  Peds negative pediatric ROS (+)  Hematology negative hematology ROS (+)   Anesthesia Other Findings Past Medical History: No date: Abdominal aortic atherosclerosis (HCC)     Comment:  a. 05/2017 CTA abd/pelvis: significant atherosclerotic               dzs of the infrarenal abd Ao w/ some mural thrombus. No               aneurysm or dissection. No date: Acute focal ischemia of small intestine (HCC) 06/13/2017: Acute right-sided low back pain with right-sided sciatica 07/29/2017: AKI (acute kidney injury) (HCC) No date: Anemia No date: Anginal pain (HCC)     Comment:  a. 08/2012 Lexiscan MV: EF 54%, non ischemia/infarct. No date: Anxiety and depression No date: Baker's cyst of knee, right     Comment:  a. 07/2016 U/S: 4.1 x 1.4 x 2.9 cystic structure in R               poplitetal fossa. No date: Bell's palsy No date: CAD (coronary artery disease) No date: CHF (congestive heart failure) (HCC)      Comment:  a.) TTE 06/20/2017: EF 60-65%, no rwma, G1DD, no source               of cardiac emboli. No date: Chronic anticoagulation     Comment:  a.) apixaban No date: Chronic idiopathic constipation No date: Chronic mesenteric ischemia (HCC) No date: Chronic pain No date: COPD (chronic obstructive pulmonary disease) (HCC) No date: Dyspnea No date: Embolus of superior mesenteric artery (HCC)     Comment:  a. 05/2017 CTA Abd/pelvis: apparent thrombus or embolus               in prox SMA (70-90%); b. 05/2017 catheter directed tPA,               mechanical thrombectomy, and stenting of the SMA. 01/19/2016: Essential hypertension with goal blood pressure less than  140/90 01/19/2016: Gastroesophageal reflux disease No date: GERD (gastroesophageal reflux disease) No date: H/O colectomy 11/14/2019: History of 2019 novel coronavirus disease (COVID-19) No date: History of kidney stones No date: Hyperlipidemia No date: Hypertension No date: Morbid obesity with BMI of 40.0-44.9, adult (HCC) 06/27/2021: NSTEMI (non-ST elevated myocardial infarction) (HCC)     Comment:  a.) high sensitivity troponins trended: 893 --> 1587 -->              1748 --> 868 --> 735 ng/L 06/19/2017: Occlusive mesenteric ischemia (HCC)  No date: Osteoarthritis No date: PAD (peripheral artery disease) (HCC) No date: PAF (paroxysmal atrial fibrillation) (HCC)     Comment:  a.) CHA2DS2-VASc = 6 (sex, CHF, HTN, DVT x2, aortic               plaque). b.) rate/rhythm maintained on oral metoprolol               tartrate; chronically anticoagulated with standard dose               apixaban. No date: Palpitations No date: Peripheral edema 06/01/2016: Personal history of other malignant neoplasm of skin No date: Pneumonia No date: Pre-diabetes 06/13/2017: Primary osteoarthritis of both knees 08/06/2017: SBO (small bowel obstruction) (HCC) No date: Skin cancer of nose No date: Superior mesenteric artery thrombosis (HCC) No  date: Vitamin D deficiency  Past Surgical History: 04/06/2022: AMPUTATION; Right     Comment:  Procedure: AMPUTATION ABOVE KNEE;  Surgeon: Annice Needy, MD;  Location: [facility];  Service: Vascular;                Laterality: Right; 12/07/2022: AMPUTATION     Comment:  Procedure: AMPUTATION ABOVE KNEE REVISION WITH RESECTION              OF FEMUR;  Surgeon: Annice Needy, MD;  Location: [facility]               [facility];  Service: Vascular;; 02/21/2023: AMPUTATION; Left     Comment:  Procedure: AMPUTATION ABOVE KNEE;  Surgeon: Annice Needy, MD;  Location: [facility];  Service: General;                Laterality: Left; 03/22/2023: AMPUTATION; Left     Comment:  Procedure: AMPUTATION ABOVE KNEE STUMP DEBIDEMENT;                Surgeon: Annice Needy, MD;  Location: [facility];  Service:              Vascular;  Laterality: Left; 12/09/2019: AMPUTATION TOE; Left     Comment:  Procedure: AMPUTATION TOE MPJ left;  Surgeon: Rosetta Posner, DPM;  Location: [facility];  Service: Podiatry;                Laterality: Left; No date: APPENDECTOMY 12/01/2021: APPLICATION OF WOUND VAC     Comment:  Procedure: APPLICATION OF WOUND VAC;  Surgeon: Annice Needy, MD;  Location: [facility];  Service: Vascular;; 05/03/2022: APPLICATION OF WOUND VAC; Right     Comment:  Procedure: APPLICATION OF WOUND VAC;  Surgeon: Annice Needy, MD;  Location: [facility];  Service: Vascular;                Laterality: Right; 07/23/2022: APPLICATION OF WOUND VAC; Left     Comment:  Procedure: APPLICATION OF WOUND VAC;  Surgeon: Nada Libman, MD;  Location: [facility];  Service: Vascular;                Laterality:  Left;  Prevena 08/28/2022: APPLICATION OF WOUND VAC; Right     Comment:  Procedure: APPLICATION OF WOUND VAC;  Surgeon: Annice Needy, MD;  Location: [facility];  Service: Vascular;                Laterality: Right; 12/07/2022: APPLICATION OF  WOUND VAC     Comment:  Procedure: APPLICATION OF WOUND VAC;  Surgeon: Annice Needy, MD;  Location: [facility];  Service: Vascular;; 03/28/2023: APPLICATION OF WOUND VAC; Left     Comment:  Procedure: LEFT STUMP WOUND VAC EXCHANGE;  Surgeon: Annice Needy, MD;  Location: [facility];  Service: Vascular;                Laterality: Left; 04/02/2023: APPLICATION OF WOUND VAC; Left     Comment:  Procedure: LEFT STUMP WOUND VAC EXCHANGE;  Surgeon: Annice Needy, MD;  Location: [facility];  Service: Vascular;                Laterality: Left; 07/23/2022: AXILLARY-FEMORAL BYPASS GRAFT; Left     Comment:  Procedure: BYPASS GRAFT AXILLA-BIFEMORAL;  Surgeon:               Nada Libman, MD;  Location: [facility];  Service:               Vascular;  Laterality: Left; No date: CHOLECYSTECTOMY No date: COLON SURGERY 03/23/2022: COLONOSCOPY WITH PROPOFOL; N/A     Comment:  Procedure: COLONOSCOPY WITH PROPOFOL;  Surgeon: Wyline Mood, MD;  Location: [facility];  Service:               Gastroenterology;  Laterality: N/A;  rectal bleed 02/15/2023: CORONARY/GRAFT ACUTE MI REVASCULARIZATION; N/A     Comment:  Procedure: Coronary/Graft Acute MI Revascularization;                Surgeon: Marykay Lex, MD;  Location: [facility]               CV [facility];  Service: Cardiovascular;  Laterality: N/A; 06/21/2020: EMBOLECTOMY OR THROMBECTOMY, WITH OR WITHOUT CATHETER;  FEMOROPOPLITEAL, AORTOILIAC ARTERY, BY LEG INCISION; N/A     Comment:  Left - Decomp Fasciotomy Leg; Ant &/Or Lat Compart Only;              Midline - Angiography, Visceral, Selective Or               Supraselective (With Or Without Flush Aortogram); Left -               Revascularize, Endovasc, Open/Percut, Iliac Artery,               Unilat, Initial Vessel; W/Translum Stent, W/Angioplasty;               Right - Revascularize, Endovasc, Open/Percut, Iliac               Artery, Ea Add`L Ipsilateral;  W/Translumin Stent,               W/Angioplasty; Location [facility]; 12/01/2021: ENDARTERECTOMY FEMORAL; Left     Comment:  Procedure: ENDARTERECTOMY FEMORAL;  Surgeon: Annice Needy, MD;  Location: [facility];  Service: Vascular;                Laterality: Left; 12/01/2021: ENDOVASCULAR REPAIR/STENT GRAFT; Left     Comment:  Procedure: ENDOVASCULAR REPAIR/STENT GRAFT;  Surgeon:               Annice Needy, MD;  Location: [facility];                Service: Cardiovascular;  Laterality: Left; 05/03/2022: INCISION AND DRAINAGE OF WOUND; Right     Comment:  Procedure: IRRIGATION AND DEBRIDEMENT RIGHT STUMP;                Surgeon: Annice Needy, MD;  Location: [facility];  Service:              Vascular;  Laterality: Right; 08/28/2022: IRRIGATION AND DEBRIDEMENT ABSCESS; Right     Comment:  Procedure: AKA IRRIGATION AND DEBRIDEMENT ABSCESS;                Surgeon: Annice Needy, MD;  Location: [facility];  Service:              Vascular;  Laterality: Right; 08/08/2017: LAPAROTOMY; N/A     Comment:  Procedure: EXPLORATORY LAPAROTOMY POSSIBLE BOWEL               RESECTION;  Surgeon: Leafy Ro, MD;  Location: [facility]               [facility];  Service: General;  Laterality: N/A; 05/17/2021: LEFT HEART CATH AND CORONARY ANGIOGRAPHY; N/A     Comment:  Procedure: LEFT HEART CATH AND CORONARY ANGIOGRAPHY;                Surgeon: Alwyn Pea, MD;  Location: [facility]              [facility];  Service: Cardiovascular;  Laterality: N/A; 02/15/2023: LEFT HEART CATH AND CORONARY ANGIOGRAPHY; N/A     Comment:  Procedure: LEFT HEART CATH AND CORONARY ANGIOGRAPHY;                Surgeon: Marykay Lex, MD;  Location: [facility]               [facility];  Service: Cardiovascular;  Laterality: N/A; 11/17/2019: LOWER EXTREMITY ANGIOGRAPHY; Left     Comment:  Procedure: LOWER EXTREMITY ANGIOGRAPHY;  Surgeon: Annice Needy, MD;  Location: [facility];  Service:                Cardiovascular;  Laterality: Left; 12/13/2020: LOWER EXTREMITY ANGIOGRAPHY; Right     Comment:  Procedure: LOWER EXTREMITY ANGIOGRAPHY;  Surgeon: Annice Needy, MD;  Location: [facility];  Service:               Cardiovascular;  Laterality: Right; 01/10/2021: LOWER EXTREMITY ANGIOGRAPHY; Right     Comment:  Procedure: LOWER EXTREMITY ANGIOGRAPHY;  Surgeon: Annice Needy, MD;  Location: [facility];  Service:  Cardiovascular;  Laterality: Right; 01/24/2021: LOWER EXTREMITY ANGIOGRAPHY; Right     Comment:  Procedure: LOWER EXTREMITY ANGIOGRAPHY;  Surgeon: Annice Needy, MD;  Location: [facility];  Service:               Cardiovascular;  Laterality: Right; 06/28/2021: LOWER EXTREMITY ANGIOGRAPHY; Bilateral     Comment:  Procedure: Lower Extremity Angiography;  Surgeon: Annice Needy, MD;  Location: [facility];  Service:               Cardiovascular;  Laterality: Bilateral; 11/28/2021: LOWER EXTREMITY ANGIOGRAPHY; Right     Comment:  Procedure: LOWER EXTREMITY ANGIOGRAPHY;  Surgeon: Annice Needy, MD;  Location: [facility];  Service:               Cardiovascular;  Laterality: Right; 11/30/2021: LOWER EXTREMITY ANGIOGRAPHY; Left     Comment:  Procedure: Lower Extremity Angiography;  Surgeon: Annice Needy, MD;  Location: [facility];  Service:               Cardiovascular;  Laterality: Left; 02/22/2022: LOWER EXTREMITY ANGIOGRAPHY; Right     Comment:  Procedure: Lower Extremity Angiography;  Surgeon: Annice Needy, MD;  Location: [facility];  Service:               Cardiovascular;  Laterality: Right; 03/13/2022: LOWER EXTREMITY ANGIOGRAPHY; Right     Comment:  Procedure: Lower Extremity Angiography;  Surgeon: Annice Needy, MD;  Location: [facility];  Service:               Cardiovascular;  Laterality:  Right; 03/24/2022: LOWER EXTREMITY ANGIOGRAPHY; Right     Comment:  Procedure: Lower Extremity Angiography;  Surgeon: Annice Needy, MD;  Location: [facility];  Service:               Cardiovascular;  Laterality: Right; 01/17/2023: LOWER EXTREMITY ANGIOGRAPHY; Left     Comment:  Procedure: Lower Extremity Angiography;  Surgeon: Annice Needy, MD;  Location: [facility];  Service:               Cardiovascular;  Laterality: Left; 11/19/2019: LOWER EXTREMITY INTERVENTION; N/A     Comment:  Procedure: LOWER EXTREMITY INTERVENTION;  Surgeon: Annice Needy, MD;  Location: [facility];  Service:               Cardiovascular;  Laterality: N/A; 06/29/2021: LOWER EXTREMITY INTERVENTION; Bilateral     Comment:  Procedure: LOWER EXTREMITY INTERVENTION;  Surgeon: Annice Needy, MD;  Location: [facility];  Service:  Cardiovascular;  Laterality: Bilateral; 01/18/2023: LOWER EXTREMITY INTERVENTION; Left     Comment:  Procedure: LOWER EXTREMITY INTERVENTION;  Surgeon: Annice Needy, MD;  Location: [facility];  Service:               Cardiovascular;  Laterality: Left; 06/22/2017: TEE WITHOUT CARDIOVERSION; N/A     Comment:  Procedure: TRANSESOPHAGEAL ECHOCARDIOGRAM (TEE);                Surgeon: Iran Ouch, MD;  Location: [facility];                Service: Cardiovascular;  Laterality: N/A; 07/11/2018: TOTAL KNEE ARTHROPLASTY; Right     Comment:  Procedure: TOTAL KNEE ARTHROPLASTY;  Surgeon: Christena Flake, MD;  Location: [facility];  Service: Orthopedics;                Laterality: Right; No date: VAGINAL HYSTERECTOMY 06/20/2017: VISCERAL ARTERY INTERVENTION; N/A     Comment:  Procedure: Visceral Artery Intervention, possible aortic              thrombectomy;  Surgeon: Annice Needy, MD;  Location: [facility];  Service: Cardiovascular;   Laterality:               N/A; 01/28/2018: VISCERAL ARTERY INTERVENTION; N/A     Comment:  Procedure: VISCERAL ARTERY INTERVENTION;  Surgeon: Annice Needy, MD;  Location: [facility];  Service:               Cardiovascular;  Laterality: N/A;  BMI    Body Mass Index: 32.01 kg/m      Reproductive/Obstetrics negative OB ROS                             Anesthesia Physical Anesthesia Plan  ASA: 4  Anesthesia Plan: General   Post-op Pain Management:    Induction: Intravenous  PONV Risk Score and Plan:   Airway Management Planned: Natural Airway and Nasal Cannula  Additional Equipment:   Intra-op Plan:   Post-operative Plan:   Informed Consent: I have reviewed the patients History and Physical, chart, labs and discussed the procedure including the risks, benefits and alternatives for the proposed anesthesia with the patient or authorized representative who has indicated his/her understanding and acceptance.       Plan Discussed with: CRNA and Surgeon  Anesthesia Plan Comments:        Anesthesia Quick Evaluation

## 2023-04-05 NOTE — Transfer of Care (Signed)
Immediate Anesthesia Transfer of Care Note  Patient: Carla [name]  Procedure(s) Performed: LEFT STUMP WOUND VAC EXCHANGE (Left)  Patient Location: PACU  Anesthesia Type:General  Level of Consciousness: drowsy  Airway & Oxygen Therapy: Patient Spontanous Breathing and Patient connected to face mask oxygen  Post-op Assessment: Report given to RN, Post -op Vital signs reviewed and stable, and Patient moving all extremities  Post vital signs: Reviewed and stable  Last Vitals:  Vitals Value Taken Time  BP 104/46 04/05/23 1200  Temp    Pulse 82 04/05/23 1203  Resp 10 04/05/23 1203  SpO2 100 % 04/05/23 1203  Vitals shown include unvalidated device data.  Last Pain:  Vitals:   04/05/23 1018  TempSrc: Temporal  PainSc: 9       Patients Stated Pain Goal: 5 (04/02/23 2045)  Complications: No notable events documented.

## 2023-04-05 NOTE — Op Note (Addendum)
    OPERATIVE NOTE   PROCEDURE: Irrigation and debridement of skin and soft tissue for approximately 90 cm to the left above-knee amputation site with placement of a long term (non-disposable) wound VAC.  PRE-OPERATIVE DIAGNOSIS: Nonviable tissue and infection of left above-knee amputation wound  POST-OPERATIVE DIAGNOSIS: Same as above  SURGEON: Festus Barren, MD  ASSISTANT(S): Rolla Plate, NP  ANESTHESIA: MAC  ESTIMATED BLOOD LOSS: 10 cc  FINDING(S): None  SPECIMEN(S):  none  INDICATIONS:   Carla [name] is a 62 y.o. female who presents with a left above-knee amputation wound.  We have done serial debridements and it has decreased in size but there remains nonviable fat tissue present.  She is brought back for debridement and change of her dressing.  Risks and benefits were discussed. An assistant was present during the procedure to help facilitate the exposure and expedite the procedure.   DESCRIPTION: After obtaining full informed written consent, the patient was brought back to the operating room and placed supine upon the operating table.  The patient received IV antibiotics prior to induction. The assistant provided retraction and mobilization to help facilitate exposure and expedite the procedure throughout the entire procedure.  This included following suture, using retractors, and optimizing lighting.  After obtaining adequate anesthesia, the patient was prepped and draped in the standard fashion.  The wound was then opened and excisional debridement was performed to a small amount of skin edge and a significant amount of the necrotic adipose tissue in the soft tissue to remove all clearly non-viable tissue.  The tissue was taken back to bleeding tissue that appeared viable.  The debridement was performed with Metzenbaum scissors and encompassed an area of approximately 90 cm2.  The wound was irrigated copiously with sterile saline.  The wound had decreased in size and now measures  16 cm in length by about 6 cm in width and only about 3 cm in depth.  After all clearly non-viable tissue was removed, micronized Kerecis was placed into the wound.  A VAC dressing sponge was then cut to fit the wound.  Strips of Ioban were used and a good occlusive seal was obtained.  This was connected to suction tubing to 125 cm of suction with a good seal. This was a long term (non-disposable) VAC dressing.  The patient was then awakened from anesthesia and taken to the recovery room in stable condition having tolerated the procedure well.  COMPLICATIONS: none  CONDITION: stable  Festus Barren  04/05/2023, 11:54 AM   This note was created with Dragon Medical transcription system. Any errors in dictation are purely unintentional.

## 2023-04-05 NOTE — Anesthesia Postprocedure Evaluation (Signed)
Anesthesia Post Note  Patient: Carla [name]  Procedure(s) Performed: LEFT STUMP WOUND VAC EXCHANGE (Left)  Patient location during evaluation: PACU Anesthesia Type: General Level of consciousness: awake Pain management: pain level not controlled Vital Signs Assessment: post-procedure vital signs reviewed and stable Respiratory status: spontaneous breathing Cardiovascular status: stable Anesthetic complications: no   No notable events documented.   Last Vitals:  Vitals:   04/05/23 1245 04/05/23 1300  BP: (!) 136/55   Pulse: 74 71  Resp: 16 11  Temp:  (!) 36.4 C  SpO2: 99% 100%    Last Pain:  Vitals:   04/05/23 1300  TempSrc:   PainSc: 5                  VAN STAVEREN,Haelie Clapp

## 2023-04-05 NOTE — Progress Notes (Signed)
PROGRESS NOTE    Carla [name]  WUJ:[id number] DOB: 04/08/1961 DOA: 03/21/2023 PCP: [facility]    Brief Narrative:  62 year old female with history of depression, anxiety, hyperlipidemia, hypertension, tobacco use, paroxysmal atrial fibrillation on Eliquis, obesity, atherosclerosis, CAD, PAD, history of left stump infection post amputation, who presented on 03/21/2023 from vascular outpatient for due to necrosis of the left AKA stump.   Pt was admitted and started on IV antibiotics. Vascular surgery was consulted and took patient to the OR on 5/30 for debridement and wound vac placement.   6/5 s/p debridement done by vascular surgery 6/10 irrigation and debridement of skin and soft tissue approximately 130 cm to the left AKA stump.  Negative pressure dressing placement   Assessment & Plan:   Principal Problem:   Amputation stump infection (HCC) Active Problems:   Hypotension   Hypokalemia   Chronic anticoagulation   PVD (peripheral vascular disease) (HCC)   Acute blood loss anemia   Paroxysmal atrial fibrillation (HCC)   COPD (chronic obstructive pulmonary disease) (HCC)   Recurrent major depressive disorder (HCC)   Iron deficiency anemia   Essential hypertension   GERD (gastroesophageal reflux disease)   Chronic pain syndrome   Adjustment disorder with anxiety   Anxiety and depression   Pressure injury of skin   Cellulitis of left thigh   Left thigh pain   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 coli and Enterococcus and MRSA needing revision of the amputation site and IV antibiotics followed by oral omadacycline. 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 She was asked to get  admitted 5/30 - taken to OR for debridement and wound vac placement 6/1 - wound cultures growing ESBL E coli 6/5 s/p debridement and  wound vac change. 6/10 irrigation and debridement of skin and soft tissue approximately 130 cm to the left AKA stump.  Negative pressure dressing placement  Plan: Continue broad-spectrum IV antibiotics meropenem and daptomycin.  Per infectious disease will need 4 to 6 weeks.  Multimodal pain control.  Avoid IV narcotic use.  Continue bowel regimen.  Back to operating room today for VAC change    Hypotension, resolved Likely due to pain meds and infection. Low BP's have led to pain meds needing to be held.   --Reduce metoprolol 50 >> 25 >> 12.5 mg BID --s/p midodrine 5 mg TID, DC'd on 6/5 -Blood pressure stable     Hypokalemia Monitor and replace as necessary   Chronic anticoagulation Eliquis held on admission -resumed   Acute blood loss anemia Hbg on admission was 12.2, trended down post-operatively to 10.4 >> 9.7 >> 9.4>10.2 No signs of active bleeding, some serosanginous drainage in wound vac canister. Anemia panel c/w iron deficiency.  B12 level 234 low, goal >400, started vitamin B12 1000 mcg IM injection daily during hospital stay followed by oral supplement.   continue folic acid 1 mg p.o. daily and PO iron supplement w/Vit C --Monitor CBC   PVD (peripheral vascular disease)  Complicated by bilateral AKA's. Mgmt per vascular surgery   Paroxysmal atrial fibrillation  HR's controlled. Eliquis on hold, resume when okay with vascular. Continue metoprolol   COPD (chronic obstructive pulmonary disease) (HCC) Stable, not exacerbated. Continue bronchodilators PRN   Recurrent  major depressive disorder (HCC) Appears no longer on Celexa   Iron deficiency anemia Continue PO iron supplement.   Left thigh pain Due to AKA stump infection, with underlying chronic pain syndrome --Pain control per orders       Anxiety and  depression Stable   Adjustment disorder with anxiety Stable   Chronic pain syndrome Continue home Lyrica. Acute pain control as outlined above. Highly suspect hyperalgesia complicating pain control.   GERD (gastroesophageal reflux disease) Continue PPI   Essential hypertension Continue home Imdur, metoprolol (reduced from 50 >> 25 mg BID due to hypotension)   Pressure injury of skin I agree with the wound description as below. --Continue local wound care --Frequent repositioning --Diligent hygiene --Monitor for signs of infection     DVT prophylaxis: Eliquis Code Status: Full Family Communication: None Disposition Plan: Status is: Inpatient Remains inpatient appropriate because: Acute medical issues as above     Level of care: Med-Surg  Consultants:  Vascular surgery Infectious disease  Procedures:  Debridement and wound VAC change on right lower extremity stump   Antimicrobials: Daptomycin Meropenem   Subjective: Seen and examined.  Pain control appears improved this morning.  Objective: Vitals:   04/04/23 2138 04/05/23 0528 04/05/23 0820 04/05/23 1018  BP: 121/69 (!) 85/54 (!) 93/41 (!) 101/49  Pulse: 81 69 70 67  Resp: 18 18 18 16   Temp: 98.4 F (36.9 C) 98.3 F (36.8 C) 98.3 F (36.8 C) 98.8 F (37.1 C)  TempSrc:    Temporal  SpO2: 97% 92% 97% 96%  Weight:      Height:       No intake or output data in the 24 hours ending 04/05/23 1106  Filed Weights   03/21/23 1410 03/22/23 1006 03/28/23 0953  Weight: 81.6 kg 81.6 kg 79.4 kg    Examination:  General exam: No acute distress Respiratory system: Clear to auscultation. Respiratory effort normal.  Room air Cardiovascular system: S1-S2, RRR, no murmurs Gastrointestinal system: Soft, NT/ND, normal bowel sounds Central nervous system: Alert and oriented. No focal neurological deficits. Extremities: Bilateral AKA Skin: No rashes, lesions or ulcers Psychiatry: Judgement and insight  appear impaired. Mood & affect tearful.     Data Reviewed: I have personally reviewed following labs and imaging studies  CBC: Recent Labs  Lab 03/30/23 0404 03/31/23 0410 04/01/23 0346 04/02/23 0400 04/04/23 0502  WBC 6.5 5.0 4.7 6.1 6.6  NEUTROABS  --   --   --   --  4.1  HGB 8.6* 8.7* 8.3* 8.6* 8.1*  HCT 28.2* 28.8* 28.0* 28.2* 27.5*  MCV 88.4 89.4 89.2 88.4 89.3  PLT 268 278 250 276 275   Basic Metabolic Panel: Recent Labs  Lab 03/30/23 0404 03/31/23 0410 04/01/23 0346 04/02/23 0400 04/04/23 0502  NA 140 138 139 139 140  K 4.3 4.7 4.2 4.3 4.5  CL 102 99 101 101 104  CO2 29 29 30 30 27   GLUCOSE 113* 120* 131* 106* 89  BUN 17 17 22 22 22   CREATININE 0.37* 0.44 0.45 0.42* 0.47  CALCIUM 8.6* 8.8* 8.4* 8.6* 7.5*  MG 2.4 2.5*  --   --   --   PHOS 4.2 5.0*  --   --   --    GFR: Estimated Creatinine Clearance: 70.2 mL/min (by C-G formula based on SCr of 0.47 mg/dL). Liver Function Tests: No results for input(s): "AST", "ALT", "ALKPHOS", "BILITOT", "PROT", "ALBUMIN" in the last 168 hours. No results for input(s): "LIPASE", "AMYLASE" in the last  168 hours. No results for input(s): "AMMONIA" in the last 168 hours. Coagulation Profile: No results for input(s): "INR", "PROTIME" in the last 168 hours. Cardiac Enzymes: Recent Labs  Lab 04/04/23 0502  CKTOTAL 33*   BNP (last 3 results) No results for input(s): "PROBNP" in the last 8760 hours. HbA1C: No results for input(s): "HGBA1C" in the last 72 hours. CBG: No results for input(s): "GLUCAP" in the last 168 hours. Lipid Profile: No results for input(s): "CHOL", "HDL", "LDLCALC", "TRIG", "CHOLHDL", "LDLDIRECT" in the last 72 hours. Thyroid Function Tests: No results for input(s): "TSH", "T4TOTAL", "FREET4", "T3FREE", "THYROIDAB" in the last 72 hours. Anemia Panel: No results for input(s): "VITAMINB12", "FOLATE", "FERRITIN", "TIBC", "IRON", "RETICCTPCT" in the last 72 hours. Sepsis Labs: No results for input(s):  "PROCALCITON", "LATICACIDVEN" in the last 168 hours.  Recent Results (from the past 240 hour(s))  Aerobic/Anaerobic Culture w Gram Stain (surgical/deep wound)     Status: None   Collection Time: 03/28/23 10:59 AM   Specimen: Path Tissue  Result Value Ref Range Status   Specimen Description   Final    TISSUE Performed at [facility], [address], [place], [place] [address]    Special Requests SWAB OF LEFT LEG STUMP  Final   Gram Stain NO WBC SEEN NO ORGANISMS SEEN   Final   Culture   Final    RARE STAPHYLOCOCCUS AUREUS RARE PROTEUS MIRABILIS NO ANAEROBES ISOLATED Performed at [facility], [address], [place], [place] [address]    Report Status 04/02/2023 FINAL  Final   Organism ID, Bacteria STAPHYLOCOCCUS AUREUS  Final   Organism ID, Bacteria PROTEUS MIRABILIS  Final      Susceptibility   Proteus mirabilis - MIC*    AMPICILLIN <=2 SENSITIVE Sensitive     CEFEPIME <=0.12 SENSITIVE Sensitive     CEFTAZIDIME <=1 SENSITIVE Sensitive     CEFTRIAXONE <=0.25 SENSITIVE Sensitive     CIPROFLOXACIN <=0.25 SENSITIVE Sensitive     GENTAMICIN <=1 SENSITIVE Sensitive     IMIPENEM 2 SENSITIVE Sensitive     TRIMETH/SULFA <=20 SENSITIVE Sensitive     AMPICILLIN/SULBACTAM <=2 SENSITIVE Sensitive     PIP/TAZO <=4 SENSITIVE Sensitive     * RARE PROTEUS MIRABILIS   Staphylococcus aureus - MIC*    CIPROFLOXACIN >=8 RESISTANT Resistant     ERYTHROMYCIN >=8 RESISTANT Resistant     GENTAMICIN <=0.5 SENSITIVE Sensitive     OXACILLIN >=4 RESISTANT Resistant     TETRACYCLINE >=16 RESISTANT Resistant     VANCOMYCIN 1 SENSITIVE Sensitive     TRIMETH/SULFA <=10 SENSITIVE Sensitive     CLINDAMYCIN >=8 RESISTANT Resistant     RIFAMPIN <=0.5 SENSITIVE Sensitive     Inducible Clindamycin NEGATIVE Sensitive     LINEZOLID 2 SENSITIVE Sensitive     * RARE STAPHYLOCOCCUS AUREUS         Radiology Studies: No results found.      Scheduled Meds:  [MAR Hold] acetaminophen   1,000 mg Oral Q8H   [MAR Hold] apixaban  5 mg Oral BID   [MAR Hold] ascorbic acid  500 mg Oral BID   [MAR Hold] atorvastatin  40 mg Oral QHS   [START ON 04/06/2023] chlorhexidine  60 mL Topical Once   [MAR Hold] vitamin B-12  1,000 mcg Oral Daily   [MAR Hold] feeding supplement  237 mL Oral BID BM   [MAR Hold] ferrous sulfate  325 mg Oral Q breakfast   [MAR Hold] folic acid  1  mg Oral Daily   [MAR Hold] isosorbide mononitrate  120 mg Oral Daily   [MAR Hold] metoprolol tartrate  12.5 mg Oral BID   [MAR Hold] nystatin cream   Topical BID   [MAR Hold] oxyCODONE  10 mg Oral Once   [MAR Hold] oxyCODONE  30 mg Oral Q12H   [MAR Hold] pantoprazole  40 mg Oral Daily   [MAR Hold] polyethylene glycol  17 g Oral Daily   [MAR Hold] pregabalin  200 mg Oral TID   [MAR Hold] senna-docusate  1 tablet Oral BID   [MAR Hold] Vitamin D (Ergocalciferol)  50,000 Units Oral Q7 days   [MAR Hold] zinc sulfate  220 mg Oral Daily   Continuous Infusions:  sodium chloride 75 mL/hr at 04/05/23 0848    ceFAZolin (ANCEF) IV     [MAR Hold] DAPTOmycin (CUBICIN) 500 mg in sodium chloride 0.9 % IVPB 500 mg (04/04/23 1417)   [MAR Hold] meropenem (MERREM) IV 1 g (04/05/23 0559)     LOS: 15 days    Tresa Moore, MD [facility]   If 7PM-7AM, please contact night-coverage  04/05/2023, 11:06 AM

## 2023-04-05 NOTE — Interval H&P Note (Signed)
History and Physical Interval Note:  04/05/2023 10:57 AM  Carla [name]  has presented today for surgery, with the diagnosis of infection.  The various methods of treatment have been discussed with the patient and family. After consideration of risks, benefits and other options for treatment, the patient has consented to  Procedure(s): LEFT STUMP WOUND VAC EXCHANGE (Left) as a surgical intervention.  The patient's history has been reviewed, patient examined, no change in status, stable for surgery.  I have reviewed the patient's chart and labs.  Questions were answered to the patient's satisfaction.     Festus Barren

## 2023-04-06 ENCOUNTER — Encounter: Payer: Self-pay | Admitting: Vascular Surgery

## 2023-04-06 DIAGNOSIS — A498 Other bacterial infections of unspecified site: Secondary | ICD-10-CM

## 2023-04-06 DIAGNOSIS — T8740 Infection of amputation stump, unspecified extremity: Secondary | ICD-10-CM | POA: Diagnosis not present

## 2023-04-06 LAB — C-REACTIVE PROTEIN: CRP: 1.6 mg/dL — ABNORMAL HIGH (ref <1.0)

## 2023-04-06 LAB — SEDIMENTATION RATE: Sed Rate: 83 mm/hr — ABNORMAL HIGH (ref 0–30)

## 2023-04-06 NOTE — Plan of Care (Signed)

## 2023-04-06 NOTE — Progress Notes (Signed)
PROGRESS NOTE    Carla [name]  ZOX:[id number] DOB: March 29, 1961 DOA: 03/21/2023 PCP: [facility]    Brief Narrative:  62 year old female with history of depression, anxiety, hyperlipidemia, hypertension, tobacco use, paroxysmal atrial fibrillation on Eliquis, obesity, atherosclerosis, CAD, PAD, history of left stump infection post amputation, who presented on 03/21/2023 from vascular outpatient for due to necrosis of the left AKA stump.   Pt was admitted and started on IV antibiotics. Vascular surgery was consulted and took patient to the OR on 5/30 for debridement and wound vac placement.   6/5 s/p debridement done by vascular surgery 6/10 irrigation and debridement of skin and soft tissue approximately 130 cm to the left AKA stump.  Negative pressure dressing placement   Assessment & Plan:   Principal Problem:   Amputation stump infection (HCC) Active Problems:   Hypotension   Hypokalemia   Chronic anticoagulation   PVD (peripheral vascular disease) (HCC)   Acute blood loss anemia   Paroxysmal atrial fibrillation (HCC)   COPD (chronic obstructive pulmonary disease) (HCC)   Recurrent major depressive disorder (HCC)   Iron deficiency anemia   Essential hypertension   GERD (gastroesophageal reflux disease)   Chronic pain syndrome   Adjustment disorder with anxiety   Anxiety and depression   Pressure injury of skin   Cellulitis of left thigh   Left thigh pain   MRSA infection   Infection due to ESBL-producing Escherichia coli   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 coli and Enterococcus and MRSA needing revision of the amputation site and IV antibiotics followed by oral omadacycline. Left leg irreversible ischemia April 2024 and was in [facility] 02/15/23-02/24/23 needing left above-the-knee amputation on 02/21/2023 She went for a vascular follow up appt on 03/21/23 and they noted the surgical  site was necrotic and had purulent discharge. She was asked to get admitted 5/30 - taken to OR for debridement and wound vac placement 6/1 - wound cultures growing ESBL E coli 6/5 s/p debridement and  wound vac change. 6/10 irrigation and debridement of skin and soft tissue approximately 130 cm to the left AKA stump.  Negative pressure dressing placement  Plan: Continue broad-spectrum IV antibiotics meropenem and daptomycin.  Per infectious disease will need 4 to 6 weeks.  Multimodal pain control.  Avoid IV narcotic use.  Continue bowel regimen.  Next trip to the operating room for [facility] change and wound washout 6/17   Hypotension, resolved Likely due to pain meds and infection. Low BP's have led to pain meds needing to be held.   --Reduce metoprolol 50 >> 25 >> 12.5 mg BID --s/p midodrine 5 mg TID, DC'd on 6/5 -Blood pressure stable     Hypokalemia Monitor and replace as necessary   Chronic anticoagulation Continue Eliquis   Acute blood loss anemia Hbg on admission was 12.2, trended down post-operatively to 10.4 >> 9.7 >> 9.4>10.2 No signs of active bleeding, some serosanginous drainage in wound vac canister. Anemia panel c/w iron deficiency.  B12 level 234 low, goal >400, started vitamin B12 1000 mcg IM injection daily during hospital stay followed by oral supplement.   continue folic acid 1 mg p.o. daily and PO iron supplement w/Vit C --Monitor CBC   PVD (peripheral vascular disease)  Complicated by bilateral AKA's. Mgmt per vascular surgery   Paroxysmal atrial fibrillation  HR's controlled. Eliquis on hold, resume when okay with vascular. Continue metoprolol   COPD (chronic  obstructive pulmonary disease) (HCC) Stable, not exacerbated. Continue bronchodilators PRN   Recurrent major depressive disorder (HCC) Appears no longer on Celexa   Iron deficiency anemia Continue PO iron supplement.   Left thigh pain Due to AKA stump infection, with underlying chronic pain  syndrome --Pain control per orders       Anxiety and depression Stable   Adjustment disorder with anxiety Stable   Chronic pain syndrome Continue home Lyrica. Acute pain control as outlined above. Highly suspect hyperalgesia complicating pain control.   GERD (gastroesophageal reflux disease) Continue PPI   Essential hypertension Continue home Imdur, metoprolol (reduced from 50 >> 25 mg BID due to hypotension)   Pressure injury of skin I agree with the wound description as below. --Continue local wound care --Frequent repositioning --Diligent hygiene --Monitor for signs of infection     DVT prophylaxis: Eliquis Code Status: Full Family Communication: None Disposition Plan: Status is: Inpatient Remains inpatient appropriate because: Acute medical issues as above     Level of care: Med-Surg  Consultants:  Vascular surgery Infectious disease  Procedures:  Debridement and wound VAC change on right lower extremity stump   Antimicrobials: Daptomycin Meropenem   Subjective: Seen and examined.  Sleeping on arrival.  Continues to endorse pain  Objective: Vitals:   04/05/23 2338 04/06/23 0456 04/06/23 0910 04/06/23 1014  BP: 106/69 (!) 124/51 (!) 104/46 (!) 104/44  Pulse: 90 61 72 70  Resp:  12 16   Temp:  98.2 F (36.8 C) 97.8 F (36.6 C)   TempSrc:   Oral   SpO2:  98% 98% 97%  Weight:      Height:        Intake/Output Summary (Last 24 hours) at 04/06/2023 1104 Last data filed at 04/06/2023 0644 Gross per 24 hour  Intake 300 ml  Output 70 ml  Net 230 ml    Filed Weights   03/21/23 1410 03/22/23 1006 03/28/23 0953  Weight: 81.6 kg 81.6 kg 79.4 kg    Examination:  General exam: NAD Respiratory system: Bibasilar crackles.  Normal work of breathing.  Room air Cardiovascular system: S1-S2, RRR, no murmurs Gastrointestinal system: Soft, NT/ND, normal bowel sounds Central nervous system: Alert and oriented. No focal neurological  deficits. Extremities: Bilateral AKA Skin: No rashes, lesions or ulcers Psychiatry: Judgement and insight appear impaired. Mood & affect tearful.     Data Reviewed: I have personally reviewed following labs and imaging studies  CBC: Recent Labs  Lab 03/31/23 0410 04/01/23 0346 04/02/23 0400 04/04/23 0502  WBC 5.0 4.7 6.1 6.6  NEUTROABS  --   --   --  4.1  HGB 8.7* 8.3* 8.6* 8.1*  HCT 28.8* 28.0* 28.2* 27.5*  MCV 89.4 89.2 88.4 89.3  PLT 278 250 276 275   Basic Metabolic Panel: Recent Labs  Lab 03/31/23 0410 04/01/23 0346 04/02/23 0400 04/04/23 0502  NA 138 139 139 140  K 4.7 4.2 4.3 4.5  CL 99 101 101 104  CO2 29 30 30 27   GLUCOSE 120* 131* 106* 89  BUN 17 22 22 22   CREATININE 0.44 0.45 0.42* 0.47  CALCIUM 8.8* 8.4* 8.6* 7.5*  MG 2.5*  --   --   --   PHOS 5.0*  --   --   --    GFR: Estimated Creatinine Clearance: 70.2 mL/min (by C-G formula based on SCr of 0.47 mg/dL). Liver Function Tests: No results for input(s): "AST", "ALT", "ALKPHOS", "BILITOT", "PROT", "ALBUMIN" in the last 168 hours. No results  for input(s): "LIPASE", "AMYLASE" in the last 168 hours. No results for input(s): "AMMONIA" in the last 168 hours. Coagulation Profile: No results for input(s): "INR", "PROTIME" in the last 168 hours. Cardiac Enzymes: Recent Labs  Lab 04/04/23 0502  CKTOTAL 33*   BNP (last 3 results) No results for input(s): "PROBNP" in the last 8760 hours. HbA1C: No results for input(s): "HGBA1C" in the last 72 hours. CBG: No results for input(s): "GLUCAP" in the last 168 hours. Lipid Profile: No results for input(s): "CHOL", "HDL", "LDLCALC", "TRIG", "CHOLHDL", "LDLDIRECT" in the last 72 hours. Thyroid Function Tests: No results for input(s): "TSH", "T4TOTAL", "FREET4", "T3FREE", "THYROIDAB" in the last 72 hours. Anemia Panel: No results for input(s): "VITAMINB12", "FOLATE", "FERRITIN", "TIBC", "IRON", "RETICCTPCT" in the last 72 hours. Sepsis Labs: No results for  input(s): "PROCALCITON", "LATICACIDVEN" in the last 168 hours.  Recent Results (from the past 240 hour(s))  Aerobic/Anaerobic Culture w Gram Stain (surgical/deep wound)     Status: None   Collection Time: 03/28/23 10:59 AM   Specimen: Path Tissue  Result Value Ref Range Status   Specimen Description   Final    TISSUE Performed at [facility], [address], [place], [place] [address]    Special Requests SWAB OF LEFT LEG STUMP  Final   Gram Stain NO WBC SEEN NO ORGANISMS SEEN   Final   Culture   Final    RARE STAPHYLOCOCCUS AUREUS RARE PROTEUS MIRABILIS NO ANAEROBES ISOLATED Performed at [facility], [address], [place], [place] [address]    Report Status 04/02/2023 FINAL  Final   Organism ID, Bacteria STAPHYLOCOCCUS AUREUS  Final   Organism ID, Bacteria PROTEUS MIRABILIS  Final      Susceptibility   Proteus mirabilis - MIC*    AMPICILLIN <=2 SENSITIVE Sensitive     CEFEPIME <=0.12 SENSITIVE Sensitive     CEFTAZIDIME <=1 SENSITIVE Sensitive     CEFTRIAXONE <=0.25 SENSITIVE Sensitive     CIPROFLOXACIN <=0.25 SENSITIVE Sensitive     GENTAMICIN <=1 SENSITIVE Sensitive     IMIPENEM 2 SENSITIVE Sensitive     TRIMETH/SULFA <=20 SENSITIVE Sensitive     AMPICILLIN/SULBACTAM <=2 SENSITIVE Sensitive     PIP/TAZO <=4 SENSITIVE Sensitive     * RARE PROTEUS MIRABILIS   Staphylococcus aureus - MIC*    CIPROFLOXACIN >=8 RESISTANT Resistant     ERYTHROMYCIN >=8 RESISTANT Resistant     GENTAMICIN <=0.5 SENSITIVE Sensitive     OXACILLIN >=4 RESISTANT Resistant     TETRACYCLINE >=16 RESISTANT Resistant     VANCOMYCIN 1 SENSITIVE Sensitive     TRIMETH/SULFA <=10 SENSITIVE Sensitive     CLINDAMYCIN >=8 RESISTANT Resistant     RIFAMPIN <=0.5 SENSITIVE Sensitive     Inducible Clindamycin NEGATIVE Sensitive     LINEZOLID 2 SENSITIVE Sensitive     * RARE STAPHYLOCOCCUS AUREUS         Radiology Studies: No results found.      Scheduled Meds:  acetaminophen   1,000 mg Oral Q8H   apixaban  5 mg Oral BID   ascorbic acid  500 mg Oral BID   atorvastatin  40 mg Oral QHS   chlorhexidine  60 mL Topical Once   vitamin B-12  1,000 mcg Oral Daily   feeding supplement  237 mL Oral BID BM   ferrous sulfate  325 mg Oral Q breakfast   folic acid  1 mg Oral Daily   isosorbide mononitrate  120 mg Oral Daily  metoprolol tartrate  12.5 mg Oral BID   nystatin cream   Topical BID   oxyCODONE  30 mg Oral Q12H   pantoprazole  40 mg Oral Daily   polyethylene glycol  17 g Oral Daily   pregabalin  200 mg Oral TID   senna-docusate  1 tablet Oral BID   Vitamin D (Ergocalciferol)  50,000 Units Oral Q7 days   zinc sulfate  220 mg Oral Daily   Continuous Infusions:  DAPTOmycin (CUBICIN) 500 mg in sodium chloride 0.9 % IVPB 500 mg (04/05/23 1844)   meropenem (MERREM) IV 1 g (04/06/23 7829)     LOS: 16 days    Tresa Moore, MD [facility]   If 7PM-7AM, please contact night-coverage  04/06/2023, 11:04 AM

## 2023-04-06 NOTE — Progress Notes (Signed)
Progress Note    04/06/2023 9:33 AM 1 Day Post-Op  Subjective:  Carla [name] is a 62 year old female who presented to the vein and vascular clinic with an infected left stump status post left above-the-knee amputation 02/21/2023.  Patient returns to clinic on 03/21/23 for evaluation of the left stump.  She is found to have an infection with drainage.  Patient was sent to the emergency room for further workup.  Patient was then taken to the OR on 03/22/2023 for debridement and wound VAC placement.  Patient then had her second wound VAC change on 03/28/2023. Patient had her third wound vac change on 04/02/2023. Patient had her fourth wound vac change on 04/05/2023.  Patient is resting comfortably in bed this morning sleeping. She continues to endorse strong 7-8 out of 10 pain where the wound vac is placed while awake. No other complaints overnight. Vitals all remain stable.      Vitals:   04/06/23 0456 04/06/23 0910  BP: (!) 124/51 (!) 104/46  Pulse: 61 72  Resp: 12 16  Temp: 98.2 F (36.8 C) 97.8 F (36.6 C)  SpO2: 98% 98%   Physical Exam: Cardiac:  HX Atrial fibrillation rate in the 90's Lungs:  Clear on auscultation throughout.  Incisions:  Left stump incision dressed with wound vac. Working well Extremities:  Bilateral lower extremity AKA's. Left with infection and now wound vac  Abdomen:  Positive bowel sounds. Right lower quadrant hernia.  Neurologic: AAOX3 follows commands.  CBC    Component Value Date/Time   WBC 6.6 04/04/2023 0502   RBC 3.08 (L) 04/04/2023 0502   HGB 8.1 (L) 04/04/2023 0502   HGB 13.4 02/12/2015 1137   HCT 27.5 (L) 04/04/2023 0502   HCT 40.9 02/12/2015 1137   PLT 275 04/04/2023 0502   PLT 272 02/12/2015 1137   MCV 89.3 04/04/2023 0502   MCV 90 02/12/2015 1137   MCH 26.3 04/04/2023 0502   MCHC 29.5 (L) 04/04/2023 0502   RDW 17.0 (H) 04/04/2023 0502   RDW 14.8 (H) 02/12/2015 1137   LYMPHSABS 1.7 04/04/2023 0502   LYMPHSABS 1.7 08/27/2014 1145    MONOABS 0.6 04/04/2023 0502   MONOABS 0.4 08/27/2014 1145   EOSABS 0.2 04/04/2023 0502   EOSABS 0.0 08/27/2014 1145   BASOSABS 0.0 04/04/2023 0502   BASOSABS 0.0 08/27/2014 1145    BMET    Component Value Date/Time   NA 140 04/04/2023 0502   NA 140 02/12/2015 1137   K 4.5 04/04/2023 0502   K 2.9 (L) 02/12/2015 1137   CL 104 04/04/2023 0502   CL 107 02/12/2015 1137   CO2 27 04/04/2023 0502   CO2 27 02/12/2015 1137   GLUCOSE 89 04/04/2023 0502   GLUCOSE 120 (H) 02/12/2015 1137   BUN 22 04/04/2023 0502   BUN 15 02/12/2015 1137   CREATININE 0.47 04/04/2023 0502   CREATININE 0.78 02/12/2015 1137   CALCIUM 7.5 (L) 04/04/2023 0502   CALCIUM 9.0 02/12/2015 1137   GFRNONAA >60 04/04/2023 0502   GFRNONAA >60 02/12/2015 1137   GFRAA >60 11/23/2019 1330   GFRAA >60 02/12/2015 1137    INR    Component Value Date/Time   INR 1.6 (H) 03/21/2023 1627   INR 0.8 07/23/2012 0433     Intake/Output Summary (Last 24 hours) at 04/06/2023 0933 Last data filed at 04/06/2023 0644 Gross per 24 hour  Intake 300 ml  Output 270 ml  Net 30 ml     Assessment/Plan:  62 y.o. female is  s/p Left Lower extremity AKA wound washout with Vac re-application  1 Day Post-Op   PLAN: Patient will be taken back to the operating room for first wound VAC change on Thursday, April 09, 2023.  Will most likely entail wound washout and possibly antibiotic with Kerecis placed.  Patient is aware and agrees to the timeline.    DVT prophylaxis:  Eliquis 5 mg twice daily    [name] [facility] 04/06/2023 9:33 AM

## 2023-04-06 NOTE — Progress Notes (Signed)
Date of Admission:  03/21/2023      ID: Carla [name] is a 62 y.o. female  Principal Problem:   Amputation stump infection (HCC) Active Problems:   Essential hypertension   GERD (gastroesophageal reflux disease)   Chronic pain syndrome   Recurrent major depressive disorder (HCC)   Hypotension   Adjustment disorder with anxiety   Paroxysmal atrial fibrillation (HCC)   COPD (chronic obstructive pulmonary disease) (HCC)   PVD (peripheral vascular disease) (HCC)   Hypokalemia   Chronic anticoagulation   Anxiety and depression   Acute blood loss anemia   Iron deficiency anemia   Pressure injury of skin   Cellulitis of left thigh   Left thigh pain   MRSA infection   Infection due to ESBL-producing Escherichia coli    Subjective: Patient feeling better today  Medications:   acetaminophen  1,000 mg Oral Q8H   apixaban  5 mg Oral BID   ascorbic acid  500 mg Oral BID   atorvastatin  40 mg Oral QHS   chlorhexidine  60 mL Topical Once   vitamin B-12  1,000 mcg Oral Daily   feeding supplement  237 mL Oral BID BM   ferrous sulfate  325 mg Oral Q breakfast   folic acid  1 mg Oral Daily   isosorbide mononitrate  120 mg Oral Daily   metoprolol tartrate  12.5 mg Oral BID   nystatin cream   Topical BID   oxyCODONE  30 mg Oral Q12H   pantoprazole  40 mg Oral Daily   polyethylene glycol  17 g Oral Daily   pregabalin  200 mg Oral TID   senna-docusate  1 tablet Oral BID   Vitamin D (Ergocalciferol)  50,000 Units Oral Q7 days   zinc sulfate  220 mg Oral Daily    Objective: Vital signs in last 24 hours: Patient Vitals for the past 24 hrs:  BP Temp Temp src Pulse Resp SpO2  04/06/23 1853 (!) 101/56 -- -- 85 -- 93 %  04/06/23 1538 (!) 110/56 98.6 F (37 C) Oral 83 16 96 %  04/06/23 1438 134/67 -- -- 76 -- 98 %  04/06/23 1014 (!) 104/44 -- -- 70 -- 97 %  04/06/23 0910 (!) 104/46 97.8 F (36.6 C) Oral 72 16 98 %  04/06/23 0456 (!) 124/51 98.2 F (36.8 C) -- 61 12 98 %   04/05/23 2338 106/69 -- -- 90 -- --  04/05/23 2033 (!) 110/36 (!) 97.5 F (36.4 C) -- 81 16 97 %     PHYSICAL EXAM:  General: Alert, cooperative, no distress Lungs: Clear to auscultation bilaterally. No Wheezing or Rhonchi. No rales. Heart:s1s2 Abdomen: Soft, non-tender,not distended. Bowel sounds normal. No masses Extremities: b/l AKA- - Left stump- not closed- wound clean     R stump  small wound almost closed Skin: No rashes or lesions. Or bruising Lymph: Cervical, supraclavicular normal. Neurologic: Grossly non-focal  Lab Results    Latest Ref Rng & Units 04/04/2023    5:02 AM 04/02/2023    4:00 AM 04/01/2023    3:46 AM  CBC  WBC 4.0 - 10.5 K/uL 6.6  6.1  4.7   Hemoglobin 12.0 - 15.0 g/dL 8.1  8.6  8.3   Hematocrit 36.0 - 46.0 % 27.5  28.2  28.0   Platelets 150 - 400 K/uL 275  276  250        Latest Ref Rng & Units 04/04/2023    5:02 AM 04/02/2023  4:00 AM 04/01/2023    3:46 AM  CMP  Glucose 70 - 99 mg/dL 89  478  295   BUN 8 - 23 mg/dL 22  22  22    Creatinine 0.44 - 1.00 mg/dL 6.21  3.08  6.57   Sodium 135 - 145 mmol/L 140  139  139   Potassium 3.5 - 5.1 mmol/L 4.5  4.3  4.2   Chloride 98 - 111 mmol/L 104  101  101   CO2 22 - 32 mmol/L 27  30  30    Calcium 8.9 - 10.3 mg/dL 7.5  8.6  8.4       Microbiology: Left AKA stump  surgical wound has ESBL ecoli and proteus in the wound MRSA     Assessment/Plan: 62 y.o. female with a history of A-fib, CHF, PAD, COPD, HLD, hypertension, right AKA, left leg irreversible ischemia April 2024 and was in [facility] 02/15/23-02/24/23 needing left above-the-knee amputation on 02/21/2023 She went for a vascular follow up appt on 03/21/23 and they noted the surgical site was necrotic and had purulent discharge. She was asked to get admitted   Left AKA stump infection- ESBL ecoli and proteus and MRSA  in the  wound Currently on meropenem and daptomycin   She is going to need Iv antibitcs for 4 -6 weeks Will need IV antibiotic (  ertapenem and dapto on discharge) would need until 05/04/23   Rt AKA stump= wound Is very small and almost closed Had chronic infection for 3 months ( MRSA, ESBL, enterococcus) and took many courses of antibiotics   Current smoker Severe PAD with stents   CAD Imdur Metoprolol atorvastatin   HTN? ? AFIB controlled- on eliquis   Anemia    Discussed the management with the patient

## 2023-04-06 NOTE — TOC Progression Note (Addendum)
Transition of Care [facility]) - Progression Note    Patient Details  Name: Carla [name] MRN: [id number] Date of Birth: 1960-11-04  Transition of Care [facility]) CM/SW Contact  Allena Katz, LCSW Phone Number: 04/06/2023, 3:00 PM  Clinical Narrative:   CSW spoke with patient who reports brandy will assist at home with Vac changes and antibiotics. Pt reports she is having a procedure on Monday and would like to plan for her to be able to go home. Pt was wondering about brandy getting paid for taking care of her. CSW informed her there is a program for this we can refer her to. Pt agreeable.    3:24pm Referral submitted for CAP for patient. They will mail application to patient to complete for daughter to be paid to be caretaker.    Expected Discharge Plan: Home w Home Health Services Barriers to Discharge: Continued Medical Work up  Expected Discharge Plan and Services     Post Acute Care Choice: Resumption of Svcs/PTA Provider Living arrangements for the past 2 months: Single Family Home                           HH Arranged: RN, PT, OT [facility] Agency: [facility] Date HH Agency Contacted: 03/22/23   Representative spoke with at [facility] Agency: Feliberto Gottron   Social Determinants of Health (SDOH) Interventions SDOH Screenings   Food Insecurity: No Food Insecurity (03/22/2023)  Housing: Low Risk  (03/22/2023)  Transportation Needs: No Transportation Needs (03/22/2023)  Utilities: Not At Risk (03/22/2023)  Depression (PHQ2-9): Low Risk  (01/25/2023)  Tobacco Use: Medium Risk (04/06/2023)    Readmission Risk Interventions    03/23/2023   12:17 PM 12/06/2022    1:31 PM 07/25/2022   10:49 AM  Readmission Risk Prevention Plan  Transportation Screening Complete Complete Complete  Medication Review [employer]) Complete Complete Complete  PCP or Specialist appointment within 3-5 days of discharge Complete  Complete  HRI or Home Care Consult Complete  Complete  SW  Recovery Care/Counseling Consult Complete Complete Not Complete  SW Consult Not Complete Comments   NA  Palliative Care Screening Not Applicable Not Applicable   Skilled Nursing Facility Not Applicable  Patient Refused

## 2023-04-06 NOTE — Consult Note (Signed)
Pharmacy Antibiotic Note  Carla [name] is a 62 y.o. female admitted on 03/21/2023 with  wound infection . Patient was sent to ED by vascular given concern for possible osteomyelitis. Patient has received multiple IV antibiotics this year including meropenem, daptomycin, and ertapenem in February and March. She has also received other oral agents including omadacycline and metronidazole this year. Historical wound cultures have grown resistant bacteria including MRSA and ESBL E. coli. Pharmacy consulted to dose daptomycin and meropenem for L AKA stump wound infection. Discussed GPC from 5/30 culture with micro lab and they were unable to isolate that organisms. 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   S/P wound vac exchange on 6/5- repeat cultures sent from OR. Noted patient is on atorvastatin 80 mg for PVD. Reduced to atorvastatin 40mg .   -  baseline CK 6/5 = 14 - 6/5 wound culture with S aureus isolated, susceptibilities pending. Also proteus mirabilis which was seen in previous culture  Wound vac exchange on 6/13 with next one planned for 6/17, next CK scheduled for 6/19  Plan: Day# 17 of Abx, Day# 14 on meropenem Continue meropenem 1 g IV q8H Continue daptomycin 500 mg IV Q24H (~8 mg/kg AdjBW for BMI>30) 6/12 CK: 33 Monitor renal function, C/S, CK weekly, F/U discharge plans  Await OPAT(will need IV abx until 05/04/23)  Height: 5\' 2"  (157.5 cm) Weight: 79.4 kg (175 lb) IBW/kg (Calculated) : 50.1  Temp (24hrs), Avg:98.1 F (36.7 C), Min:97.5 F (36.4 C), Max:98.9 F (37.2 C)  Recent Labs  Lab 03/31/23 0410 04/01/23 0346 04/02/23 0400 04/04/23 0502  WBC 5.0 4.7 6.1 6.6  CREATININE 0.44 0.45 0.42* 0.47     Estimated Creatinine Clearance: 70.2 mL/min (by C-G formula based on SCr of 0.47 mg/dL).    Allergies  Allergen Reactions   Gabapentin     Tremors    Bactrim [Sulfamethoxazole-Trimethoprim] Itching     Antimicrobials this admission: 5/29 Zosyn x 1  5/29 Vancomycin >> 6/1 5/29 Cefepime >> 6/1 6/01 Meropenem >>c 6/05 daptomycin >>c  Dose adjustments this admission: N/A  Microbiology results: 5/29 BCx: NGTD 5/30 OR cultures: ESBL E. coli (S-imipenem, S-gentamicin) + Rare GPCs (micro lab unable to isolate), GPRs, GNRs 6/5 tissue cx (during VAC change): S aureus, P mirabilis  Thank you for allowing pharmacy to be a part of this patient's care.  Bettey Costa, PharmD Clinical Pharmacist 04/06/2023 3:09 PM

## 2023-04-07 DIAGNOSIS — T8740 Infection of amputation stump, unspecified extremity: Secondary | ICD-10-CM | POA: Diagnosis not present

## 2023-04-07 LAB — CBC WITH DIFFERENTIAL/PLATELET
Abs Immature Granulocytes: 0.03 10*3/uL (ref 0.00–0.07)
Basophils Absolute: 0 10*3/uL (ref 0.0–0.1)
Basophils Relative: 0 %
Eosinophils Absolute: 0.2 10*3/uL (ref 0.0–0.5)
Eosinophils Relative: 4 %
HCT: 31.2 % — ABNORMAL LOW (ref 36.0–46.0)
Hemoglobin: 9.2 g/dL — ABNORMAL LOW (ref 12.0–15.0)
Immature Granulocytes: 1 %
Lymphocytes Relative: 26 %
Lymphs Abs: 1.4 10*3/uL (ref 0.7–4.0)
MCH: 26.5 pg (ref 26.0–34.0)
MCHC: 29.5 g/dL — ABNORMAL LOW (ref 30.0–36.0)
MCV: 89.9 fL (ref 80.0–100.0)
Monocytes Absolute: 0.4 10*3/uL (ref 0.1–1.0)
Monocytes Relative: 7 %
Neutro Abs: 3.2 10*3/uL (ref 1.7–7.7)
Neutrophils Relative %: 62 %
Platelets: 265 10*3/uL (ref 150–400)
RBC: 3.47 MIL/uL — ABNORMAL LOW (ref 3.87–5.11)
RDW: 17.1 % — ABNORMAL HIGH (ref 11.5–15.5)
WBC: 5.2 10*3/uL (ref 4.0–10.5)
nRBC: 0 % (ref 0.0–0.2)

## 2023-04-07 LAB — BASIC METABOLIC PANEL
Anion gap: 10 (ref 5–15)
BUN: 16 mg/dL (ref 8–23)
CO2: 27 mmol/L (ref 22–32)
Calcium: 8.6 mg/dL — ABNORMAL LOW (ref 8.9–10.3)
Chloride: 102 mmol/L (ref 98–111)
Creatinine, Ser: 0.57 mg/dL (ref 0.44–1.00)
GFR, Estimated: >60 mL/min (ref >60)
Glucose, Bld: 116 mg/dL — ABNORMAL HIGH (ref 70–99)
Potassium: 3.9 mmol/L (ref 3.5–5.1)
Sodium: 139 mmol/L (ref 135–145)

## 2023-04-07 NOTE — Progress Notes (Signed)
PROGRESS NOTE    Carla [name]  ZOX:[id number] DOB: 1961-05-30 DOA: 03/21/2023 PCP: [facility]    Brief Narrative:  62 year old female with history of depression, anxiety, hyperlipidemia, hypertension, tobacco use, paroxysmal atrial fibrillation on Eliquis, obesity, atherosclerosis, CAD, PAD, history of left stump infection post amputation, who presented on 03/21/2023 from vascular outpatient for due to necrosis of the left AKA stump.   Pt was admitted and started on IV antibiotics. Vascular surgery was consulted and took patient to the OR on 5/30 for debridement and wound vac placement.   6/5 s/p debridement done by vascular surgery 6/10 irrigation and debridement of skin and soft tissue approximately 130 cm to the left AKA stump.  Negative pressure dressing placement   Assessment & Plan:   Principal Problem:   Amputation stump infection (HCC) Active Problems:   Hypotension   Hypokalemia   Chronic anticoagulation   PVD (peripheral vascular disease) (HCC)   Acute blood loss anemia   Paroxysmal atrial fibrillation (HCC)   COPD (chronic obstructive pulmonary disease) (HCC)   Recurrent major depressive disorder (HCC)   Iron deficiency anemia   Essential hypertension   GERD (gastroesophageal reflux disease)   Chronic pain syndrome   Adjustment disorder with anxiety   Anxiety and depression   Pressure injury of skin   Cellulitis of left thigh   Left thigh pain   MRSA infection   Infection due to ESBL-producing Escherichia coli   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 coli and Enterococcus and MRSA needing revision of the amputation site and IV antibiotics followed by oral omadacycline. Left leg irreversible ischemia April 2024 and was in [place] 02/15/23-02/24/23 needing left above-the-knee amputation on 02/21/2023 She went for a vascular follow up appt on 03/21/23 and they noted the surgical  site was necrotic and had purulent discharge. She was asked to get admitted 5/30 - taken to OR for debridement and wound vac placement 6/1 - wound cultures growing ESBL E coli 6/5 s/p debridement and  wound vac change. 6/10 irrigation and debridement of skin and soft tissue approximately 130 cm to the left AKA stump.  Negative pressure dressing placement  Plan: Continue broad-spectrum IV antibiotics meropenem and daptomycin.  Per infectious disease will need 4 to 6 weeks.  Multimodal pain control.  Avoid IV narcotic use.  Continue bowel regimen.  Next trip to the operating room for [facility] change and wound washout 6/17.   Hypotension, resolved Likely due to pain meds and infection. Low BP's have led to pain meds needing to be held.   --Reduce metoprolol 50 >> 25 >> 12.5 mg BID --s/p midodrine 5 mg TID, DC'd on 6/5 -Blood pressure stable     Hypokalemia Monitor and replace as necessary   Chronic anticoagulation Continue Eliquis   Acute blood loss anemia Hbg on admission was 12.2, trended down post-operatively to 10.4 >> 9.7 >> 9.4>10.2 No signs of active bleeding, some serosanginous drainage in wound vac canister. Anemia panel c/w iron deficiency.  B12 level 234 low, goal >400, started vitamin B12 1000 mcg IM injection daily during hospital stay followed by oral supplement.   continue folic acid 1 mg p.o. daily and PO iron supplement w/Vit C --Monitor CBC   PVD (peripheral vascular disease)  Complicated by bilateral AKA's. Mgmt per vascular surgery   Paroxysmal atrial fibrillation  HR's controlled. Eliquis on hold, resume when okay with vascular. Continue metoprolol   COPD (chronic  obstructive pulmonary disease) (HCC) Stable, not exacerbated. Continue bronchodilators PRN   Recurrent major depressive disorder (HCC) Appears no longer on Celexa   Iron deficiency anemia Continue PO iron supplement.   Left thigh pain Due to AKA stump infection, with underlying chronic pain  syndrome --Pain control per orders       Anxiety and depression Stable   Adjustment disorder with anxiety Stable   Chronic pain syndrome Continue home Lyrica. Acute pain control as outlined above. Highly suspect hyperalgesia complicating pain control.   GERD (gastroesophageal reflux disease) Continue PPI   Essential hypertension Continue home Imdur, metoprolol (reduced from 50 >> 25 mg BID due to hypotension)   Pressure injury of skin I agree with the wound description as below. --Continue local wound care --Frequent repositioning --Diligent hygiene --Monitor for signs of infection     DVT prophylaxis: Eliquis Code Status: Full Family Communication: None Disposition Plan: Status is: Inpatient Remains inpatient appropriate because: Acute medical issues as above     Level of care: Med-Surg  Consultants:  Vascular surgery Infectious disease  Procedures:  Debridement and wound VAC change on right lower extremity stump   Antimicrobials: Daptomycin Meropenem   Subjective: Seen and examined.  Sleeping on arrival.  Per RN continues to endorse pain request narcotics  Objective: Vitals:   04/06/23 1538 04/06/23 1853 04/06/23 2040 04/07/23 0338  BP: (!) 110/56 (!) 101/56 124/62 119/84  Pulse: 83 85 83 71  Resp: 16  16 18   Temp: 98.6 F (37 C)  98.5 F (36.9 C) 98.4 F (36.9 C)  TempSrc: Oral   Oral  SpO2: 96% 93% 97% 100%  Weight:      Height:        Intake/Output Summary (Last 24 hours) at 04/07/2023 1118 Last data filed at 04/07/2023 0318 Gross per 24 hour  Intake --  Output 1900 ml  Net -1900 ml    Filed Weights   03/21/23 1410 03/22/23 1006 03/28/23 0953  Weight: 81.6 kg 81.6 kg 79.4 kg    Examination:  General exam: No acute distress.  Appears chronically ill Respiratory system: Bibasilar crackles.  Normal work of breathing.  Room air Cardiovascular system: S1-S2, RRR, no murmurs Gastrointestinal system: Soft, NT/ND, normal bowel  sounds Central nervous system: Alert and oriented. No focal neurological deficits. Extremities: Bilateral AKA Skin: No rashes, lesions or ulcers Psychiatry: Judgement and insight appear impaired. Mood & affect tearful.     Data Reviewed: I have personally reviewed following labs and imaging studies  CBC: Recent Labs  Lab 04/01/23 0346 04/02/23 0400 04/04/23 0502 04/07/23 0927  WBC 4.7 6.1 6.6 5.2  NEUTROABS  --   --  4.1 3.2  HGB 8.3* 8.6* 8.1* 9.2*  HCT 28.0* 28.2* 27.5* 31.2*  MCV 89.2 88.4 89.3 89.9  PLT 250 276 275 265   Basic Metabolic Panel: Recent Labs  Lab 04/01/23 0346 04/02/23 0400 04/04/23 0502 04/07/23 0927  NA 139 139 140 139  K 4.2 4.3 4.5 3.9  CL 101 101 104 102  CO2 30 30 27 27   GLUCOSE 131* 106* 89 116*  BUN 22 22 22 16   CREATININE 0.45 0.42* 0.47 0.57  CALCIUM 8.4* 8.6* 7.5* 8.6*   GFR: Estimated Creatinine Clearance: 70.2 mL/min (by C-G formula based on SCr of 0.57 mg/dL). Liver Function Tests: No results for input(s): "AST", "ALT", "ALKPHOS", "BILITOT", "PROT", "ALBUMIN" in the last 168 hours. No results for input(s): "LIPASE", "AMYLASE" in the last 168 hours. No results for input(s): "AMMONIA" in  the last 168 hours. Coagulation Profile: No results for input(s): "INR", "PROTIME" in the last 168 hours. Cardiac Enzymes: Recent Labs  Lab 04/04/23 0502  CKTOTAL 33*   BNP (last 3 results) No results for input(s): "PROBNP" in the last 8760 hours. HbA1C: No results for input(s): "HGBA1C" in the last 72 hours. CBG: No results for input(s): "GLUCAP" in the last 168 hours. Lipid Profile: No results for input(s): "CHOL", "HDL", "LDLCALC", "TRIG", "CHOLHDL", "LDLDIRECT" in the last 72 hours. Thyroid Function Tests: No results for input(s): "TSH", "T4TOTAL", "FREET4", "T3FREE", "THYROIDAB" in the last 72 hours. Anemia Panel: No results for input(s): "VITAMINB12", "FOLATE", "FERRITIN", "TIBC", "IRON", "RETICCTPCT" in the last 72 hours. Sepsis  Labs: No results for input(s): "PROCALCITON", "LATICACIDVEN" in the last 168 hours.  No results found for this or any previous visit (from the past 240 hour(s)).        Radiology Studies: No results found.      Scheduled Meds:  acetaminophen  1,000 mg Oral Q8H   apixaban  5 mg Oral BID   ascorbic acid  500 mg Oral BID   atorvastatin  40 mg Oral QHS   chlorhexidine  60 mL Topical Once   vitamin B-12  1,000 mcg Oral Daily   feeding supplement  237 mL Oral BID BM   ferrous sulfate  325 mg Oral Q breakfast   folic acid  1 mg Oral Daily   isosorbide mononitrate  120 mg Oral Daily   metoprolol tartrate  12.5 mg Oral BID   nystatin cream   Topical BID   oxyCODONE  30 mg Oral Q12H   pantoprazole  40 mg Oral Daily   polyethylene glycol  17 g Oral Daily   pregabalin  200 mg Oral TID   senna-docusate  1 tablet Oral BID   Vitamin D (Ergocalciferol)  50,000 Units Oral Q7 days   zinc sulfate  220 mg Oral Daily   Continuous Infusions:  DAPTOmycin (CUBICIN) 500 mg in sodium chloride 0.9 % IVPB 500 mg (04/06/23 1533)   meropenem (MERREM) IV 1 g (04/07/23 0518)     LOS: 17 days    Tresa Moore, MD [facility]   If 7PM-7AM, please contact night-coverage  04/07/2023, 11:18 AM

## 2023-04-08 DIAGNOSIS — T8740 Infection of amputation stump, unspecified extremity: Secondary | ICD-10-CM | POA: Diagnosis not present

## 2023-04-08 MED ORDER — CHLORHEXIDINE GLUCONATE 4 % EX SOLN
60.0000 mL | Freq: Once | CUTANEOUS | Status: AC
  Administered 2023-04-09: 4 via TOPICAL

## 2023-04-08 MED ORDER — SODIUM CHLORIDE 0.9 % IV SOLN: INTRAVENOUS | Status: DC

## 2023-04-08 MED ORDER — CEFAZOLIN SODIUM-DEXTROSE 2-4 GM/100ML-% IV SOLN: 2.0000 g | INTRAVENOUS | Status: DC

## 2023-04-08 NOTE — Progress Notes (Signed)
[facility] [place] PROGRESS NOTE  ASSESSMENT / PLAN: Carla [name] is a 62 y.o. 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 Will plan to debride this in the operating room tomorrow. Keep NPO after midnight. Keep VAC on until OR. Please call for questions in the interim.    SUBJECTIVE: No complaints. Reviewed operative plan for tomorrow.   OBJECTIVE: BP (!) 106/54 (BP Location: Right Arm)   Pulse 77   Temp 98.1 F (36.7 C)   Resp 16   Ht 5' 2" (1.575 m)   Wt 79.4 kg   SpO2 95%   BMI 32.01 kg/m   Intake/Output Summary (Last 24 hours) at 04/08/2023 0755 Last data filed at 04/08/2023 0700 Gross per 24 hour  Intake 960 ml  Output --  Net 960 ml    Chronically ill woman in no distress L AKA with VAC     Latest Ref Rng & Units 04/07/2023    9:27 AM 04/04/2023    5:02 AM 04/02/2023    4:00 AM  CBC  WBC 4.0 - 10.5 K/uL 5.2  6.6  6.1   Hemoglobin 12.0 - 15.0 g/dL 9.2  8.1  8.6   Hematocrit 36.0 - 46.0 % 31.2  27.5  28.2   Platelets 150 - 400 K/uL 265  275  276         Latest Ref Rng & Units 04/07/2023    9:27 AM 04/04/2023    5:02 AM 04/02/2023    4:00 AM  CMP  Glucose 70 - 99 mg/dL 116  89  106   BUN 8 - 23 mg/dL 16  22  22   Creatinine 0.44 - 1.00 mg/dL 0.57  0.47  0.42   Sodium 135 - 145 mmol/L 139  140  139   Potassium 3.5 - 5.1 mmol/L 3.9  4.5  4.3   Chloride 98 - 111 mmol/L 102  104  101   CO2 22 - 32 mmol/L 27  27  30   Calcium 8.9 - 10.3 mg/dL 8.6  7.5  8.6     Estimated Creatinine Clearance: 70.2 mL/min (by C-G formula based on SCr of 0.57 mg/dL).  Kaeleen Odom N. Tonika Eden, MD FACS [facility] [place] Office Phone Number: [phone] 04/08/2023 7:55 AM     

## 2023-04-08 NOTE — H&P (View-Only) (Signed)
[facility] [place] PROGRESS NOTE  ASSESSMENT / PLAN: Carla [name] is a 62 y.o. 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 Will plan to debride this in the operating room tomorrow. Keep NPO after midnight. Keep VAC on until OR. Please call for questions in the interim.    SUBJECTIVE: No complaints. Reviewed operative plan for tomorrow.   OBJECTIVE: BP (!) 106/54 (BP Location: Right Arm)   Pulse 77   Temp 98.1 F (36.7 C)   Resp 16   Ht 5\' 2"  (1.575 m)   Wt 79.4 kg   SpO2 95%   BMI 32.01 kg/m   Intake/Output Summary (Last 24 hours) at 04/08/2023 0755 Last data filed at 04/08/2023 0700 Gross per 24 hour  Intake 960 ml  Output --  Net 960 ml    Chronically ill woman in no distress L AKA with VAC     Latest Ref Rng & Units 04/07/2023    9:27 AM 04/04/2023    5:02 AM 04/02/2023    4:00 AM  CBC  WBC 4.0 - 10.5 K/uL 5.2  6.6  6.1   Hemoglobin 12.0 - 15.0 g/dL 9.2  8.1  8.6   Hematocrit 36.0 - 46.0 % 31.2  27.5  28.2   Platelets 150 - 400 K/uL 265  275  276         Latest Ref Rng & Units 04/07/2023    9:27 AM 04/04/2023    5:02 AM 04/02/2023    4:00 AM  CMP  Glucose 70 - 99 mg/dL 161  89  096   BUN 8 - 23 mg/dL 16  22  22    Creatinine 0.44 - 1.00 mg/dL 0.45  4.09  8.11   Sodium 135 - 145 mmol/L 139  140  139   Potassium 3.5 - 5.1 mmol/L 3.9  4.5  4.3   Chloride 98 - 111 mmol/L 102  104  101   CO2 22 - 32 mmol/L 27  27  30    Calcium 8.9 - 10.3 mg/dL 8.6  7.5  8.6     Estimated Creatinine Clearance: 70.2 mL/min (by C-G formula based on SCr of 0.57 mg/dL).  Rande Brunt. Lenell Antu, MD [facility] Phone Number: [phone] 04/08/2023 7:55 AM

## 2023-04-08 NOTE — Progress Notes (Signed)
PROGRESS NOTE    Carla [name]  ZOX:[id number] DOB: 1961/03/28 DOA: 03/21/2023 PCP: [facility]    Brief Narrative:  62 year old female with history of depression, anxiety, hyperlipidemia, hypertension, tobacco use, paroxysmal atrial fibrillation on Eliquis, obesity, atherosclerosis, CAD, PAD, history of left stump infection post amputation, who presented on 03/21/2023 from vascular outpatient for due to necrosis of the left AKA stump.   Pt was admitted and started on IV antibiotics. Vascular surgery was consulted and took patient to the OR on 5/30 for debridement and wound vac placement.   6/5 s/p debridement done by vascular surgery 6/10 irrigation and debridement of skin and soft tissue approximately 130 cm to the left AKA stump.  Negative pressure dressing placement   Assessment & Plan:   Principal Problem:   Amputation stump infection (HCC) Active Problems:   Hypotension   Hypokalemia   Chronic anticoagulation   PVD (peripheral vascular disease) (HCC)   Acute blood loss anemia   Paroxysmal atrial fibrillation (HCC)   COPD (chronic obstructive pulmonary disease) (HCC)   Recurrent major depressive disorder (HCC)   Iron deficiency anemia   Essential hypertension   GERD (gastroesophageal reflux disease)   Chronic pain syndrome   Adjustment disorder with anxiety   Anxiety and depression   Pressure injury of skin   Cellulitis of left thigh   Left thigh pain   MRSA infection   Infection due to ESBL-producing Escherichia coli   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 coli and Enterococcus and MRSA needing revision of the amputation site and IV antibiotics followed by oral omadacycline. Left leg irreversible ischemia April 2024 and was in [facility] 02/15/23-02/24/23 needing left above-the-knee amputation on 02/21/2023 She went for a vascular follow up appt on 03/21/23 and they noted the surgical  site was necrotic and had purulent discharge. She was asked to get admitted 5/30 - taken to OR for debridement and wound vac placement 6/1 - wound cultures growing ESBL E coli 6/5 s/p debridement and  wound vac change. 6/10 irrigation and debridement of skin and soft tissue approximately 130 cm to the left AKA stump.  Negative pressure dressing placement  Plan: Continue broad-spectrum IV antibiotics meropenem and daptomycin.  Per infectious disease will need 4 to 6 weeks.  Multimodal pain control.  Avoid IV narcotic use.  Continue bowel regimen.  N.p.o. after midnight.  Return to OR on 6/17   Hypotension, resolved Likely due to pain meds and infection. Low BP's have led to pain meds needing to be held.   --Reduce metoprolol 50 >> 25 >> 12.5 mg BID --s/p midodrine 5 mg TID, DC'd on 6/5 -Blood pressure stable     Hypokalemia Monitor and replace as necessary   Chronic anticoagulation Continue Eliquis   Acute blood loss anemia Hbg on admission was 12.2, trended down post-operatively to 10.4 >> 9.7 >> 9.4>10.2 No signs of active bleeding, some serosanginous drainage in wound vac canister. Anemia panel c/w iron deficiency.  B12 level 234 low, goal >400, started vitamin B12 1000 mcg IM injection daily during hospital stay followed by oral supplement.   continue folic acid 1 mg p.o. daily and PO iron supplement w/Vit C --Monitor CBC   PVD (peripheral vascular disease)  Complicated by bilateral AKA's. Mgmt per vascular surgery   Paroxysmal atrial fibrillation  HR's controlled. Eliquis on hold, resume when okay with vascular. Continue metoprolol   COPD (chronic obstructive pulmonary disease) (HCC)  Stable, not exacerbated. Continue bronchodilators PRN   Recurrent major depressive disorder (HCC) Appears no longer on Celexa   Iron deficiency anemia Continue PO iron supplement.   Left thigh pain Due to AKA stump infection, with underlying chronic pain syndrome --Pain control per  orders       Anxiety and depression Stable   Adjustment disorder with anxiety Stable   Chronic pain syndrome Continue home Lyrica. Acute pain control as outlined above. Highly suspect hyperalgesia complicating pain control.   GERD (gastroesophageal reflux disease) Continue PPI   Essential hypertension Continue home Imdur, metoprolol (reduced from 50 >> 25 mg BID due to hypotension)   Pressure injury of skin I agree with the wound description as below. --Continue local wound care --Frequent repositioning --Diligent hygiene --Monitor for signs of infection     DVT prophylaxis: Apixaban Code Status: Full Family Communication: None Disposition Plan: Status is: Inpatient Remains inpatient appropriate because: Acute medical issues as above     Level of care: Med-Surg  Consultants:  Vascular surgery Infectious disease  Procedures:  Debridement and wound VAC change on right lower extremity stump   Antimicrobials: Daptomycin Meropenem   Subjective: Seen and examined.  Sleeping on arrival.  Appears chronically ill but no distress  Objective: Vitals:   04/08/23 0205 04/08/23 0420 04/08/23 0814 04/08/23 0815  BP: (!) 106/57 (!) 106/54 126/61 126/61  Pulse: 78 77 71 72  Resp: 20 16 16 16   Temp:  98.1 F (36.7 C) 97.7 F (36.5 C) 97.7 F (36.5 C)  TempSrc:      SpO2: 97% 95% 96% 96%  Weight:      Height:        Intake/Output Summary (Last 24 hours) at 04/08/2023 1116 Last data filed at 04/08/2023 1054 Gross per 24 hour  Intake 13444.12 ml  Output --  Net 13444.12 ml    Filed Weights   03/21/23 1410 03/22/23 1006 03/28/23 0953  Weight: 81.6 kg 81.6 kg 79.4 kg    Examination:  General exam: NAD.  Sleepy.  Chronically ill Respiratory system: Bibasilar crackles.  Normal work of breathing.  Room air Cardiovascular system: S1-S2, RRR, no murmurs Gastrointestinal system: Soft, NT/ND, normal bowel sounds Central nervous system: Alert and oriented.  No focal neurological deficits. Extremities: Bilateral AKA Skin: No rashes, lesions or ulcers Psychiatry: Judgement and insight appear impaired. Mood & affect flattened.     Data Reviewed: I have personally reviewed following labs and imaging studies  CBC: Recent Labs  Lab 04/02/23 0400 04/04/23 0502 04/07/23 0927  WBC 6.1 6.6 5.2  NEUTROABS  --  4.1 3.2  HGB 8.6* 8.1* 9.2*  HCT 28.2* 27.5* 31.2*  MCV 88.4 89.3 89.9  PLT 276 275 265   Basic Metabolic Panel: Recent Labs  Lab 04/02/23 0400 04/04/23 0502 04/07/23 0927  NA 139 140 139  K 4.3 4.5 3.9  CL 101 104 102  CO2 30 27 27   GLUCOSE 106* 89 116*  BUN 22 22 16   CREATININE 0.42* 0.47 0.57  CALCIUM 8.6* 7.5* 8.6*   GFR: Estimated Creatinine Clearance: 70.2 mL/min (by C-G formula based on SCr of 0.57 mg/dL). Liver Function Tests: No results for input(s): "AST", "ALT", "ALKPHOS", "BILITOT", "PROT", "ALBUMIN" in the last 168 hours. No results for input(s): "LIPASE", "AMYLASE" in the last 168 hours. No results for input(s): "AMMONIA" in the last 168 hours. Coagulation Profile: No results for input(s): "INR", "PROTIME" in the last 168 hours. Cardiac Enzymes: Recent Labs  Lab 04/04/23 0502  CKTOTAL  33*   BNP (last 3 results) No results for input(s): "PROBNP" in the last 8760 hours. HbA1C: No results for input(s): "HGBA1C" in the last 72 hours. CBG: No results for input(s): "GLUCAP" in the last 168 hours. Lipid Profile: No results for input(s): "CHOL", "HDL", "LDLCALC", "TRIG", "CHOLHDL", "LDLDIRECT" in the last 72 hours. Thyroid Function Tests: No results for input(s): "TSH", "T4TOTAL", "FREET4", "T3FREE", "THYROIDAB" in the last 72 hours. Anemia Panel: No results for input(s): "VITAMINB12", "FOLATE", "FERRITIN", "TIBC", "IRON", "RETICCTPCT" in the last 72 hours. Sepsis Labs: No results for input(s): "PROCALCITON", "LATICACIDVEN" in the last 168 hours.  No results found for this or any previous visit (from  the past 240 hour(s)).        Radiology Studies: No results found.      Scheduled Meds:  acetaminophen  1,000 mg Oral Q8H   apixaban  5 mg Oral BID   ascorbic acid  500 mg Oral BID   atorvastatin  40 mg Oral QHS   chlorhexidine  60 mL Topical Once   vitamin B-12  1,000 mcg Oral Daily   feeding supplement  237 mL Oral BID BM   ferrous sulfate  325 mg Oral Q breakfast   folic acid  1 mg Oral Daily   isosorbide mononitrate  120 mg Oral Daily   metoprolol tartrate  12.5 mg Oral BID   nystatin cream   Topical BID   oxyCODONE  30 mg Oral Q12H   pantoprazole  40 mg Oral Daily   polyethylene glycol  17 g Oral Daily   pregabalin  200 mg Oral TID   senna-docusate  1 tablet Oral BID   Vitamin D (Ergocalciferol)  50,000 Units Oral Q7 days   zinc sulfate  220 mg Oral Daily   Continuous Infusions:  DAPTOmycin (CUBICIN) 500 mg in sodium chloride 0.9 % IVPB Stopped (04/08/23 0125)   meropenem (MERREM) IV Stopped (04/08/23 0726)     LOS: 18 days    Tresa Moore, MD [facility]   If 7PM-7AM, please contact night-coverage  04/08/2023, 11:16 AM

## 2023-04-09 ENCOUNTER — Inpatient Hospital Stay: Payer: 59 | Admitting: Certified Registered"

## 2023-04-09 ENCOUNTER — Encounter
Admission: EM | Disposition: A | Discharge status: Home / Self Care | Payer: Self-pay | Source: Home / Self Care | Attending: Internal Medicine

## 2023-04-09 ENCOUNTER — Other Ambulatory Visit: Payer: Self-pay

## 2023-04-09 ENCOUNTER — Encounter: Payer: Self-pay | Admitting: Internal Medicine

## 2023-04-09 DIAGNOSIS — T8740 Infection of amputation stump, unspecified extremity: Secondary | ICD-10-CM | POA: Diagnosis not present

## 2023-04-09 HISTORY — PX: APPLICATION OF WOUND VAC: SHX5189

## 2023-04-09 LAB — CBC WITH DIFFERENTIAL/PLATELET
Abs Immature Granulocytes: 0.03 10*3/uL (ref 0.00–0.07)
Basophils Absolute: 0 10*3/uL (ref 0.0–0.1)
Basophils Relative: 0 %
Eosinophils Absolute: 0.3 10*3/uL (ref 0.0–0.5)
Eosinophils Relative: 4 %
HCT: 33.5 % — ABNORMAL LOW (ref 36.0–46.0)
Hemoglobin: 10.1 g/dL — ABNORMAL LOW (ref 12.0–15.0)
Immature Granulocytes: 0 %
Lymphocytes Relative: 22 %
Lymphs Abs: 1.6 10*3/uL (ref 0.7–4.0)
MCH: 26.5 pg (ref 26.0–34.0)
MCHC: 30.1 g/dL (ref 30.0–36.0)
MCV: 87.9 fL (ref 80.0–100.0)
Monocytes Absolute: 0.4 10*3/uL (ref 0.1–1.0)
Monocytes Relative: 6 %
Neutro Abs: 4.8 10*3/uL (ref 1.7–7.7)
Neutrophils Relative %: 68 %
Platelets: 308 10*3/uL (ref 150–400)
RBC: 3.81 MIL/uL — ABNORMAL LOW (ref 3.87–5.11)
RDW: 17.4 % — ABNORMAL HIGH (ref 11.5–15.5)
WBC: 7 10*3/uL (ref 4.0–10.5)
nRBC: 0 % (ref 0.0–0.2)

## 2023-04-09 LAB — BASIC METABOLIC PANEL
Anion gap: 10 (ref 5–15)
BUN: 20 mg/dL (ref 8–23)
CO2: 27 mmol/L (ref 22–32)
Calcium: 8.9 mg/dL (ref 8.9–10.3)
Chloride: 102 mmol/L (ref 98–111)
Creatinine, Ser: 0.5 mg/dL (ref 0.44–1.00)
GFR, Estimated: >60 mL/min (ref >60)
Glucose, Bld: 91 mg/dL (ref 70–99)
Potassium: 4.7 mmol/L (ref 3.5–5.1)
Sodium: 139 mmol/L (ref 135–145)

## 2023-04-09 LAB — PROTIME-INR
INR: 1.2 (ref 0.8–1.2)
Prothrombin Time: 15 seconds (ref 11.4–15.2)

## 2023-04-09 SURGERY — APPLICATION, WOUND VAC
Anesthesia: General | Laterality: Left

## 2023-04-09 MED ORDER — MIDAZOLAM HCL 2 MG/2ML IJ SOLN
INTRAMUSCULAR | Status: DC | PRN
  Administered 2023-04-09 (×2): 1 mg via INTRAVENOUS

## 2023-04-09 MED ORDER — PROPOFOL 10 MG/ML IV BOLUS
INTRAVENOUS | Status: AC
  Filled 2023-04-09: qty 20

## 2023-04-09 MED ORDER — PROPOFOL 500 MG/50ML IV EMUL
INTRAVENOUS | Status: DC | PRN
  Administered 2023-04-09: 125 ug/kg/min via INTRAVENOUS

## 2023-04-09 MED ORDER — CEFAZOLIN SODIUM-DEXTROSE 2-4 GM/100ML-% IV SOLN
INTRAVENOUS | Status: AC
  Filled 2023-04-09: qty 100

## 2023-04-09 MED ORDER — KETAMINE HCL 10 MG/ML IJ SOLN
INTRAMUSCULAR | Status: DC | PRN
  Administered 2023-04-09: 30 mg via INTRAVENOUS
  Administered 2023-04-09: 20 mg via INTRAVENOUS

## 2023-04-09 MED ORDER — OXYCODONE HCL ER 20 MG PO T12A
40.0000 mg | EXTENDED_RELEASE_TABLET | Freq: Two times a day (BID) | ORAL | Status: DC
  Administered 2023-04-09 – 2023-04-17 (×16): 40 mg via ORAL
  Filled 2023-04-09 (×16): qty 2

## 2023-04-09 MED ORDER — CEFAZOLIN SODIUM-DEXTROSE 2-3 GM-%(50ML) IV SOLR
INTRAVENOUS | Status: DC | PRN
  Administered 2023-04-09: 2 g via INTRAVENOUS

## 2023-04-09 MED ORDER — SODIUM CHLORIDE 0.9 % IV SOLN
1.0000 g | INTRAVENOUS | Status: DC
  Administered 2023-04-10 – 2023-04-17 (×8): 1000 mg via INTRAVENOUS
  Filled 2023-04-09 (×8): qty 1

## 2023-04-09 MED ORDER — OXYCODONE HCL 5 MG PO TABS: 5.0000 mg | ORAL_TABLET | Freq: Once | ORAL | Status: DC | PRN

## 2023-04-09 MED ORDER — FENTANYL CITRATE (PF) 100 MCG/2ML IJ SOLN
INTRAMUSCULAR | Status: AC
  Filled 2023-04-09: qty 2

## 2023-04-09 MED ORDER — MIDAZOLAM HCL 2 MG/2ML IJ SOLN
INTRAMUSCULAR | Status: AC
  Filled 2023-04-09: qty 2

## 2023-04-09 MED ORDER — ONDANSETRON HCL 4 MG/2ML IJ SOLN: 4.0000 mg | Freq: Once | INTRAMUSCULAR | Status: DC | PRN

## 2023-04-09 MED ORDER — FENTANYL CITRATE (PF) 100 MCG/2ML IJ SOLN
INTRAMUSCULAR | Status: DC | PRN
  Administered 2023-04-09 (×4): 25 ug via INTRAVENOUS

## 2023-04-09 MED ORDER — OXYCODONE HCL 5 MG/5ML PO SOLN: 5.0000 mg | Freq: Once | ORAL | Status: DC | PRN

## 2023-04-09 MED ORDER — ACETAMINOPHEN 10 MG/ML IV SOLN: 1000.0000 mg | Freq: Once | INTRAVENOUS | Status: DC | PRN

## 2023-04-09 MED ORDER — FENTANYL CITRATE (PF) 100 MCG/2ML IJ SOLN
25.0000 ug | INTRAMUSCULAR | Status: DC | PRN
  Administered 2023-04-09 (×3): 50 ug via INTRAVENOUS

## 2023-04-09 MED ORDER — 0.9 % SODIUM CHLORIDE (POUR BTL) OPTIME
TOPICAL | Status: DC | PRN
  Administered 2023-04-09: 50 mL

## 2023-04-09 MED ORDER — KETAMINE HCL 50 MG/5ML IJ SOSY
PREFILLED_SYRINGE | INTRAMUSCULAR | Status: AC
  Filled 2023-04-09: qty 5

## 2023-04-09 SURGICAL SUPPLY — 25 items
CANISTER WOUND CARE 500ML ATS (WOUND CARE) ×1 IMPLANT
DRAPE IMP U-DRAPE 54X76 (DRAPES) IMPLANT
DRAPE INCISE IOBAN 66X45 STRL (DRAPES) ×1 IMPLANT
DRAPE LAPAROTOMY 100X77 ABD (DRAPES) ×1 IMPLANT
DRSG VAC GRANUFOAM LG (GAUZE/BANDAGES/DRESSINGS) ×1 IMPLANT
DRSG VAC GRANUFOAM MED (GAUZE/BANDAGES/DRESSINGS) ×1 IMPLANT
ELECT REM PT RETURN 9FT ADLT (ELECTROSURGICAL) ×1
ELECTRODE REM PT RTRN 9FT ADLT (ELECTROSURGICAL) ×1 IMPLANT
GAUZE 4X4 16PLY ~~LOC~~+RFID DBL (SPONGE) ×1 IMPLANT
GLOVE BIO SURGEON STRL SZ7 (GLOVE) ×2 IMPLANT
GOWN STRL REUS W/ TWL LRG LVL3 (GOWN DISPOSABLE) ×2 IMPLANT
GOWN STRL REUS W/ TWL XL LVL3 (GOWN DISPOSABLE) ×1 IMPLANT
GOWN STRL REUS W/TWL LRG LVL3 (GOWN DISPOSABLE) ×2
GOWN STRL REUS W/TWL XL LVL3 (GOWN DISPOSABLE) ×1
GRAFT SKIN SURGIBIND FENS 7X20 (Tissue) IMPLANT
GRAFT SKIN WND MICRO 38 (Tissue) IMPLANT
KIT TURNOVER KIT A (KITS) ×1 IMPLANT
MANIFOLD NEPTUNE II (INSTRUMENTS) ×1 IMPLANT
NS IRRIG 1000ML POUR BTL (IV SOLUTION) ×1 IMPLANT
PACK BASIN MINOR ARMC (MISCELLANEOUS) ×1 IMPLANT
SOL PREP PVP 2OZ (MISCELLANEOUS) ×1
SOLUTION PREP PVP 2OZ (MISCELLANEOUS) ×1 IMPLANT
SPONGE T-LAP 18X18 ~~LOC~~+RFID (SPONGE) ×1 IMPLANT
TRAP FLUID SMOKE EVACUATOR (MISCELLANEOUS) ×1 IMPLANT
WATER STERILE IRR 500ML POUR (IV SOLUTION) ×1 IMPLANT

## 2023-04-09 NOTE — Anesthesia Preprocedure Evaluation (Signed)
Anesthesia Evaluation  Patient identified by MRN, date of birth, ID band Patient awake    Reviewed: Allergy & Precautions, NPO status , Patient's Chart, lab work & pertinent test results  Airway Mallampati: III  TM Distance: >3 FB Neck ROM: full    Dental  (+) Edentulous Upper, Edentulous Lower   Pulmonary shortness of breath and with exertion, COPD,  COPD inhaler, former smoker    + decreased breath sounds      Cardiovascular Exercise Tolerance: Poor hypertension, Pt. on medications + angina  + CAD, + Past MI, + Peripheral Vascular Disease and +CHF  Normal cardiovascular exam Rhythm:Regular Rate:Normal     Neuro/Psych  PSYCHIATRIC DISORDERS Anxiety Depression    negative neurological ROS     GI/Hepatic Neg liver ROS,GERD  Medicated,,  Endo/Other  negative endocrine ROS  Morbid obesity  Renal/GU      Musculoskeletal   Abdominal  (+) + obese  Peds negative pediatric ROS (+)  Hematology negative hematology ROS (+)   Anesthesia Other Findings Past Medical History: No date: Abdominal aortic atherosclerosis (HCC)     Comment:  a. 05/2017 CTA abd/pelvis: significant atherosclerotic               dzs of the infrarenal abd Ao w/ some mural thrombus. No               aneurysm or dissection. No date: Acute focal ischemia of small intestine (HCC) 06/13/2017: Acute right-sided low back pain with right-sided sciatica 07/29/2017: AKI (acute kidney injury) (HCC) No date: Anemia No date: Anginal pain (HCC)     Comment:  a. 08/2012 Lexiscan MV: EF 54%, non ischemia/infarct. No date: Anxiety and depression No date: Baker's cyst of knee, right     Comment:  a. 07/2016 U/S: 4.1 x 1.4 x 2.9 cystic structure in R               poplitetal fossa. No date: Bell's palsy No date: CAD (coronary artery disease) No date: CHF (congestive heart failure) (HCC)     Comment:  a.) TTE 06/20/2017: EF 60-65%, no rwma, G1DD, no source                of cardiac emboli. No date: Chronic anticoagulation     Comment:  a.) apixaban No date: Chronic idiopathic constipation No date: Chronic mesenteric ischemia (HCC) No date: Chronic pain No date: COPD (chronic obstructive pulmonary disease) (HCC) No date: Dyspnea No date: Embolus of superior mesenteric artery (HCC)     Comment:  a. 05/2017 CTA Abd/pelvis: apparent thrombus or embolus               in prox SMA (70-90%); b. 05/2017 catheter directed tPA,               mechanical thrombectomy, and stenting of the SMA. 01/19/2016: Essential hypertension with goal blood pressure less than  140/90 01/19/2016: Gastroesophageal reflux disease No date: GERD (gastroesophageal reflux disease) No date: H/O colectomy 11/14/2019: History of 2019 novel coronavirus disease (COVID-19) No date: History of kidney stones No date: Hyperlipidemia No date: Hypertension No date: Morbid obesity with BMI of 40.0-44.9, adult (HCC) 06/27/2021: NSTEMI (non-ST elevated myocardial infarction) (HCC)     Comment:  a.) high sensitivity troponins trended: 893 --> 1587 -->              1748 --> 868 --> 735 ng/L 06/19/2017: Occlusive mesenteric ischemia (HCC) No date: Osteoarthritis No date: PAD (peripheral artery disease) (HCC) No date: PAF (  paroxysmal atrial fibrillation) (HCC)     Comment:  a.) CHA2DS2-VASc = 6 (sex, CHF, HTN, DVT x2, aortic               plaque). b.) rate/rhythm maintained on oral metoprolol               tartrate; chronically anticoagulated with standard dose               apixaban. No date: Palpitations No date: Peripheral edema 06/01/2016: Personal history of other malignant neoplasm of skin No date: Pneumonia No date: Pre-diabetes 06/13/2017: Primary osteoarthritis of both knees 08/06/2017: SBO (small bowel obstruction) (HCC) No date: Skin cancer of nose No date: Superior mesenteric artery thrombosis (HCC) No date: Vitamin D deficiency  Past Surgical History: 04/06/2022: AMPUTATION;  Right     Comment:  Procedure: AMPUTATION ABOVE KNEE;  Surgeon: Annice Needy, MD;  Location: [facility];  Service: Vascular;                Laterality: Right; 12/07/2022: AMPUTATION     Comment:  Procedure: AMPUTATION ABOVE KNEE REVISION WITH RESECTION              OF FEMUR;  Surgeon: Annice Needy, MD;  Location: [facility]               [facility];  Service: Vascular;; 02/21/2023: AMPUTATION; Left     Comment:  Procedure: AMPUTATION ABOVE KNEE;  Surgeon: Annice Needy, MD;  Location: [facility];  Service: General;                Laterality: Left; 03/22/2023: AMPUTATION; Left     Comment:  Procedure: AMPUTATION ABOVE KNEE STUMP DEBIDEMENT;                Surgeon: Annice Needy, MD;  Location: [facility];  Service:              Vascular;  Laterality: Left; 12/09/2019: AMPUTATION TOE; Left     Comment:  Procedure: AMPUTATION TOE MPJ left;  Surgeon: Rosetta Posner, DPM;  Location: [facility];  Service: Podiatry;                Laterality: Left; No date: APPENDECTOMY 12/01/2021: APPLICATION OF WOUND VAC     Comment:  Procedure: APPLICATION OF WOUND VAC;  Surgeon: Annice Needy, MD;  Location: [facility];  Service: Vascular;; 05/03/2022: APPLICATION OF WOUND VAC; Right     Comment:  Procedure: APPLICATION OF WOUND VAC;  Surgeon: Annice Needy, MD;  Location: [facility];  Service: Vascular;                Laterality: Right; 07/23/2022: APPLICATION OF WOUND VAC; Left     Comment:  Procedure: APPLICATION OF WOUND VAC;  Surgeon: Nada Libman, MD;  Location: [facility];  Service: Vascular;                Laterality: Left;  Prevena 08/28/2022: APPLICATION OF WOUND VAC; Right  Comment:  Procedure: APPLICATION OF WOUND VAC;  Surgeon: Annice Needy, MD;  Location: [facility];  Service: Vascular;                Laterality: Right; 12/07/2022: APPLICATION OF WOUND VAC     Comment:  Procedure: APPLICATION OF WOUND VAC;  Surgeon: Annice Needy, MD;  Location: [facility];  Service: Vascular;; 03/28/2023: APPLICATION OF WOUND VAC; Left     Comment:  Procedure: LEFT STUMP WOUND VAC EXCHANGE;  Surgeon: Annice Needy, MD;  Location: [facility];  Service: Vascular;                Laterality: Left; 04/02/2023: APPLICATION OF WOUND VAC; Left     Comment:  Procedure: LEFT STUMP WOUND VAC EXCHANGE;  Surgeon: Annice Needy, MD;  Location: [facility];  Service: Vascular;                Laterality: Left; 07/23/2022: AXILLARY-FEMORAL BYPASS GRAFT; Left     Comment:  Procedure: BYPASS GRAFT AXILLA-BIFEMORAL;  Surgeon:               Nada Libman, MD;  Location: [facility];  Service:               Vascular;  Laterality: Left; No date: CHOLECYSTECTOMY No date: COLON SURGERY 03/23/2022: COLONOSCOPY WITH PROPOFOL; N/A     Comment:  Procedure: COLONOSCOPY WITH PROPOFOL;  Surgeon: Wyline Mood, MD;  Location: [facility];  Service:               Gastroenterology;  Laterality: N/A;  rectal bleed 02/15/2023: CORONARY/GRAFT ACUTE MI REVASCULARIZATION; N/A     Comment:  Procedure: Coronary/Graft Acute MI Revascularization;                Surgeon: Marykay Lex, MD;  Location: [facility]               CV [facility];  Service: Cardiovascular;  Laterality: N/A; 06/21/2020: EMBOLECTOMY OR THROMBECTOMY, WITH OR WITHOUT CATHETER;  FEMOROPOPLITEAL, AORTOILIAC ARTERY, BY LEG INCISION; N/A     Comment:  Left - Decomp Fasciotomy Leg; Ant &/Or Lat Compart Only;              Midline - Angiography, Visceral, Selective Or               Supraselective (With Or Without Flush Aortogram); Left -               Revascularize, Endovasc, Open/Percut, Iliac Artery,               Unilat, Initial Vessel; W/Translum Stent, W/Angioplasty;               Right - Revascularize, Endovasc, Open/Percut, Iliac               Artery, Ea Add`L Ipsilateral; W/Translumin Stent,               W/Angioplasty; Location [facility]; 12/01/2021:  ENDARTERECTOMY FEMORAL; Left     Comment:  Procedure: ENDARTERECTOMY FEMORAL;  Surgeon: Festus Barren  S, MD;  Location: [facility];  Service: Vascular;                Laterality: Left; 12/01/2021: ENDOVASCULAR REPAIR/STENT GRAFT; Left     Comment:  Procedure: ENDOVASCULAR REPAIR/STENT GRAFT;  Surgeon:               Annice Needy, MD;  Location: [facility];                Service: Cardiovascular;  Laterality: Left; 05/03/2022: INCISION AND DRAINAGE OF WOUND; Right     Comment:  Procedure: IRRIGATION AND DEBRIDEMENT RIGHT STUMP;                Surgeon: Annice Needy, MD;  Location: [facility];  Service:              Vascular;  Laterality: Right; 08/28/2022: IRRIGATION AND DEBRIDEMENT ABSCESS; Right     Comment:  Procedure: AKA IRRIGATION AND DEBRIDEMENT ABSCESS;                Surgeon: Annice Needy, MD;  Location: [facility];  Service:              Vascular;  Laterality: Right; 08/08/2017: LAPAROTOMY; N/A     Comment:  Procedure: EXPLORATORY LAPAROTOMY POSSIBLE BOWEL               RESECTION;  Surgeon: Leafy Ro, MD;  Location: [facility]               [facility];  Service: General;  Laterality: N/A; 05/17/2021: LEFT HEART CATH AND CORONARY ANGIOGRAPHY; N/A     Comment:  Procedure: LEFT HEART CATH AND CORONARY ANGIOGRAPHY;                Surgeon: Alwyn Pea, MD;  Location: [facility]              [facility];  Service: Cardiovascular;  Laterality: N/A; 02/15/2023: LEFT HEART CATH AND CORONARY ANGIOGRAPHY; N/A     Comment:  Procedure: LEFT HEART CATH AND CORONARY ANGIOGRAPHY;                Surgeon: Marykay Lex, MD;  Location: [facility]               [facility];  Service: Cardiovascular;  Laterality: N/A; 11/17/2019: LOWER EXTREMITY ANGIOGRAPHY; Left     Comment:  Procedure: LOWER EXTREMITY ANGIOGRAPHY;  Surgeon: Annice Needy, MD;  Location: [facility];  Service:               Cardiovascular;  Laterality: Left; 12/13/2020: LOWER EXTREMITY ANGIOGRAPHY;  Right     Comment:  Procedure: LOWER EXTREMITY ANGIOGRAPHY;  Surgeon: Annice Needy, MD;  Location: [facility];  Service:               Cardiovascular;  Laterality: Right; 01/10/2021: LOWER EXTREMITY ANGIOGRAPHY; Right     Comment:  Procedure: LOWER EXTREMITY ANGIOGRAPHY;  Surgeon: Annice Needy, MD;  Location: [facility];  Service:               Cardiovascular;  Laterality: Right; 01/24/2021: LOWER EXTREMITY ANGIOGRAPHY; Right     Comment:  Procedure: LOWER EXTREMITY  ANGIOGRAPHY;  Surgeon: Annice Needy, MD;  Location: [facility];  Service:               Cardiovascular;  Laterality: Right; 06/28/2021: LOWER EXTREMITY ANGIOGRAPHY; Bilateral     Comment:  Procedure: Lower Extremity Angiography;  Surgeon: Annice Needy, MD;  Location: [facility];  Service:               Cardiovascular;  Laterality: Bilateral; 11/28/2021: LOWER EXTREMITY ANGIOGRAPHY; Right     Comment:  Procedure: LOWER EXTREMITY ANGIOGRAPHY;  Surgeon: Annice Needy, MD;  Location: [facility];  Service:               Cardiovascular;  Laterality: Right; 11/30/2021: LOWER EXTREMITY ANGIOGRAPHY; Left     Comment:  Procedure: Lower Extremity Angiography;  Surgeon: Annice Needy, MD;  Location: [facility];  Service:               Cardiovascular;  Laterality: Left; 02/22/2022: LOWER EXTREMITY ANGIOGRAPHY; Right     Comment:  Procedure: Lower Extremity Angiography;  Surgeon: Annice Needy, MD;  Location: [facility];  Service:               Cardiovascular;  Laterality: Right; 03/13/2022: LOWER EXTREMITY ANGIOGRAPHY; Right     Comment:  Procedure: Lower Extremity Angiography;  Surgeon: Annice Needy, MD;  Location: [facility];  Service:               Cardiovascular;  Laterality: Right; 03/24/2022: LOWER EXTREMITY ANGIOGRAPHY; Right     Comment:  Procedure:  Lower Extremity Angiography;  Surgeon: Annice Needy, MD;  Location: [facility];  Service:               Cardiovascular;  Laterality: Right; 01/17/2023: LOWER EXTREMITY ANGIOGRAPHY; Left     Comment:  Procedure: Lower Extremity Angiography;  Surgeon: Annice Needy, MD;  Location: [facility];  Service:               Cardiovascular;  Laterality: Left; 11/19/2019: LOWER EXTREMITY INTERVENTION; N/A     Comment:  Procedure: LOWER EXTREMITY INTERVENTION;  Surgeon: Annice Needy, MD;  Location: [facility];  Service:               Cardiovascular;  Laterality: N/A; 06/29/2021: LOWER EXTREMITY INTERVENTION; Bilateral     Comment:  Procedure: LOWER EXTREMITY INTERVENTION;  Surgeon: Annice Needy, MD;  Location: [facility];  Service:               Cardiovascular;  Laterality: Bilateral; 01/18/2023: LOWER EXTREMITY  INTERVENTION; Left     Comment:  Procedure: LOWER EXTREMITY INTERVENTION;  Surgeon: Annice Needy, MD;  Location: [facility];  Service:               Cardiovascular;  Laterality: Left; 06/22/2017: TEE WITHOUT CARDIOVERSION; N/A     Comment:  Procedure: TRANSESOPHAGEAL ECHOCARDIOGRAM (TEE);                Surgeon: Iran Ouch, MD;  Location: [facility];                Service: Cardiovascular;  Laterality: N/A; 07/11/2018: TOTAL KNEE ARTHROPLASTY; Right     Comment:  Procedure: TOTAL KNEE ARTHROPLASTY;  Surgeon: Christena Flake, MD;  Location: [facility];  Service: Orthopedics;                Laterality: Right; No date: VAGINAL HYSTERECTOMY 06/20/2017: VISCERAL ARTERY INTERVENTION; N/A     Comment:  Procedure: Visceral Artery Intervention, possible aortic              thrombectomy;  Surgeon: Annice Needy, MD;  Location: [facility];  Service: Cardiovascular;  Laterality:               N/A; 01/28/2018: VISCERAL ARTERY INTERVENTION; N/A      Comment:  Procedure: VISCERAL ARTERY INTERVENTION;  Surgeon: Annice Needy, MD;  Location: [facility];  Service:               Cardiovascular;  Laterality: N/A;  BMI    Body Mass Index: 32.01 kg/m      Reproductive/Obstetrics negative OB ROS                             Anesthesia Physical Anesthesia Plan  ASA: 4  Anesthesia Plan: General   Post-op Pain Management: Minimal or no pain anticipated   Induction: Intravenous  PONV Risk Score and Plan: 3 and Propofol infusion, TIVA and Ondansetron  Airway Management Planned: Natural Airway and Nasal Cannula  Additional Equipment: None  Intra-op Plan:   Post-operative Plan:   Informed Consent: I have reviewed the patients History and Physical, chart, labs and discussed the procedure including the risks, benefits and alternatives for the proposed anesthesia with the patient or authorized representative who has indicated his/her understanding and acceptance.     Dental advisory given  Plan Discussed with: CRNA and Surgeon  Anesthesia Plan Comments: (Discussed risks of anesthesia with patient, including possibility of difficulty with spontaneous ventilation under anesthesia necessitating airway intervention, PONV, and rare risks such as cardiac or respiratory or neurological events, and allergic reactions. Discussed the role of CRNA in patient's perioperative care. Patient understands.)       Anesthesia Quick Evaluation

## 2023-04-09 NOTE — Consult Note (Addendum)
WOC Nurse wound follow up Pt is followed by the vascular team for assessment and plan of care for left stump and went to the OR today for Vac change and application of Kerecis sheet synthetic skin graft.   WOC team will not plan to follow further at this time; please re-consult if further assistance is needed.  Thank-you,  Cammie [name] MSN, RN, CWOCN, [place], CNS [phone]

## 2023-04-09 NOTE — Consult Note (Signed)
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 To discharging provider:  please sign these orders via discharge navigator,  Select New Orders & click on the button choice - Manage This Unsigned Work.     Thank you for allowing pharmacy to be a part of this patient's care.  Tressie Ellis 04/09/2023, 6:54 PM

## 2023-04-09 NOTE — Op Note (Signed)
    OPERATIVE NOTE   PROCEDURE: Irrigation and debridement of skin and soft tissue for approximately 75 cm to the left above-knee amputation site Placement of Kerecis sheet synthetic skin graft for approximately 75 cm to the left above-knee amputation site Negative pressure dressing placement left above-knee amputation site  PRE-OPERATIVE DIAGNOSIS: Nonviable tissue and open wound left above-knee amputation site  POST-OPERATIVE DIAGNOSIS: Same as above  SURGEON: Festus Barren, MD  ASSISTANT(S): Rolla Plate, NP  ANESTHESIA: MAC  ESTIMATED BLOOD LOSS: 10 cc  FINDING(S): None  SPECIMEN(S):  none  INDICATIONS:   Carla [name] is a 62 y.o. female who presents with an open wound on her left above-knee amputation site.  There is still some nonviable fat present but overall the wound has granulated well and is ready for synthetic skin graft.  We have been doing OR debridements and VAC changes for the past couple of weeks. An assistant was present during the procedure to help facilitate the exposure and expedite the procedure.  Risks and benefits are discussed and informed consent was obtained.  DESCRIPTION: After obtaining full informed written consent, the patient was brought back to the operating room and placed supine upon the operating table.  The patient received IV antibiotics prior to induction.  After obtaining adequate anesthesia, the patient was prepped and draped in the standard fashion. The assistant provided retraction and mobilization to help facilitate exposure and expedite the procedure throughout the entire procedure.  This included following suture, using retractors, and optimizing lighting.   The wound was then opened and excisional debridement was performed to the superior side skin edge and some nonviable fat and soft tissue in the wound to remove all clearly non-viable tissue.  The tissue was taken back to bleeding tissue that appeared viable.  The debridement was performed  with Metzenbaum scissors and encompassed an area of approximately 75 cm2.  The wound had contracted down some and was now measuring about 16 to 17 cm in length, about 5 cm in width, and now only about 1-2 cm in depth.  The wound was irrigated copiously with saline.  After all clearly non-viable tissue was removed, sheets of the Kerecis synthetic skin graft were placed overlying the wound.  A VAC sponge was then placed over the Kerecis.  Strips of [place] were used and a good occlusive seal was obtained when suction tubing was connected. The patient was then awakened from anesthesia and taken to the recovery room in stable condition having tolerated the procedure well.  COMPLICATIONS: none  CONDITION: stable  Festus Barren  04/09/2023, 11:02 AM   This note was created with Dragon Medical transcription system. Any errors in dictation are purely unintentional.

## 2023-04-09 NOTE — Transfer of Care (Signed)
Immediate Anesthesia Transfer of Care Note  Patient: Carla [name]  Procedure(s) Performed: LEFT STUMP WOUND VAC EXCHANGE (Left)  Patient Location: PACU  Anesthesia Type:General  Level of Consciousness: awake, drowsy, and patient cooperative  Airway & Oxygen Therapy: Patient Spontanous Breathing  Post-op Assessment: Report given to RN and Post -op Vital signs reviewed and stable  Post vital signs: Reviewed and stable  Last Vitals:  Vitals Value Taken Time  BP    Temp    Pulse 82 04/09/23 1044  Resp 16 04/09/23 1044  SpO2 98 % 04/09/23 1044  Vitals shown include unvalidated device data.  Last Pain:  Vitals:   04/09/23 0919  TempSrc: Temporal  PainSc: 6       Patients Stated Pain Goal: 2 (04/07/23 1600)  Complications: No notable events documented.

## 2023-04-09 NOTE — Anesthesia Postprocedure Evaluation (Signed)
Anesthesia Post Note  Patient: Carla [name]  Procedure(s) Performed: LEFT STUMP WOUND VAC EXCHANGE (Left)  Patient location during evaluation: PACU Anesthesia Type: General Level of consciousness: awake and alert Pain management: pain level controlled Vital Signs Assessment: post-procedure vital signs reviewed and stable Respiratory status: spontaneous breathing, nonlabored ventilation, respiratory function stable and patient connected to nasal cannula oxygen Cardiovascular status: blood pressure returned to baseline and stable Postop Assessment: no apparent nausea or vomiting Anesthetic complications: no   No notable events documented.   Last Vitals:  Vitals:   04/09/23 1100 04/09/23 1200  BP: (!) 102/91 (!) 133/104  Pulse: 80 79  Resp: 16 16  Temp:  36.7 C  SpO2: 97% 94%    Last Pain:  Vitals:   04/09/23 1200  TempSrc: Oral  PainSc:                  Corinda Gubler

## 2023-04-09 NOTE — Treatment Plan (Signed)
Diagnosis: AKA stump infection left- MRSA, ESBL ecoli Baseline Creatinine 0.50   Allergies  Allergen Reactions   Gabapentin     Tremors    Bactrim [Sulfamethoxazole-Trimethoprim] Itching    OPAT Orders Discharge antibiotics: Ertapenem 1 gram IV every 24 hours Daptomycin 500mg  IV every 24 hrs Duration: 6 weeks End Date: 05/04/23  [facility] Care Per Protocol:  Labs weekly while on IV antibiotics: _X_ CBC with differential  _X_ CMP    _X_ Please pull PIC at completion of IV antibiotics   Fax weekly lab results  promptly to [phone]  Clinic Follow Up Appt: 05/01/23 at 11.45 Am   Call [phone] with any questions/critical value

## 2023-04-09 NOTE — Interval H&P Note (Signed)
History and Physical Interval Note:  04/09/2023 8:25 AM  Carla [name]  has presented today for surgery, with the diagnosis of infection.  The various methods of treatment have been discussed with the patient and family. After consideration of risks, benefits and other options for treatment, the patient has consented to  Procedure(s): LEFT STUMP WOUND VAC EXCHANGE (Left) as a surgical intervention.  The patient's history has been reviewed, patient examined, no change in status, stable for surgery.  I have reviewed the patient's chart and labs.  Questions were answered to the patient's satisfaction.     Festus Barren

## 2023-04-09 NOTE — Progress Notes (Signed)
PROGRESS NOTE    Carla [name]  ZOX:[id number] DOB: 15-Dec-1960 DOA: 03/21/2023 PCP: [facility]    Brief Narrative:  62 year old female with history of depression, anxiety, hyperlipidemia, hypertension, tobacco use, paroxysmal atrial fibrillation on Eliquis, obesity, atherosclerosis, CAD, PAD, history of left stump infection post amputation, who presented on 03/21/2023 from vascular outpatient for due to necrosis of the left AKA stump.   Pt was admitted and started on IV antibiotics. Vascular surgery was consulted and took patient to the OR on 5/30 for debridement and wound vac placement.   6/5 s/p debridement done by vascular surgery 6/10 irrigation and debridement of skin and soft tissue approximately 130 cm to the left AKA stump.  Negative pressure dressing placement   Assessment & Plan:   Principal Problem:   Amputation stump infection (HCC) Active Problems:   Hypotension   Hypokalemia   Chronic anticoagulation   PVD (peripheral vascular disease) (HCC)   Acute blood loss anemia   Paroxysmal atrial fibrillation (HCC)   COPD (chronic obstructive pulmonary disease) (HCC)   Recurrent major depressive disorder (HCC)   Iron deficiency anemia   Essential hypertension   GERD (gastroesophageal reflux disease)   Chronic pain syndrome   Adjustment disorder with anxiety   Anxiety and depression   Pressure injury of skin   Cellulitis of left thigh   Left thigh pain   MRSA infection   Infection due to ESBL-producing Escherichia coli   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 coli and Enterococcus and MRSA needing revision of the amputation site and IV antibiotics followed by oral omadacycline. Left leg irreversible ischemia April 2024 and was in [facility] 02/15/23-02/24/23 needing left above-the-knee amputation on 02/21/2023 She went for a vascular follow up appt on 03/21/23 and they noted the surgical  site was necrotic and had purulent discharge. She was asked to get admitted 5/30 - taken to OR for debridement and wound vac placement 6/1 - wound cultures growing ESBL E coli 6/5 s/p debridement and  wound vac change. 6/10 irrigation and debridement of skin and soft tissue approximately 130 cm to the left AKA stump.  Negative pressure dressing placement  Plan: Continue broad-spectrum IV antibiotics meropenem and daptomycin.  Per infectious disease will need 4 to 6 weeks.  Multimodal pain control.  Avoid IV narcotic use.  Continue bowel regimen.  VAC change in OR today.  If no further surgical intervention from vascular will need PICC line placement   Hypotension, resolved Likely due to pain meds and infection. Low BP's have led to pain meds needing to be held.   --Reduce metoprolol 50 >> 25 >> 12.5 mg BID --s/p midodrine 5 mg TID, DC'd on 6/5 -Blood pressure stable     Hypokalemia Monitor and replace as necessary   Chronic anticoagulation Continue Eliquis   Acute blood loss anemia Hbg on admission was 12.2, trended down post-operatively to 10.4 >> 9.7 >> 9.4>10.2 No signs of active bleeding, some serosanginous drainage in wound vac canister. Anemia panel c/w iron deficiency.  B12 level 234 low, goal >400, started vitamin B12 1000 mcg IM injection daily during hospital stay followed by oral supplement.   continue folic acid 1 mg p.o. daily and PO iron supplement w/Vit C --Monitor CBC   PVD (peripheral vascular disease)  Complicated by bilateral AKA's. Mgmt per vascular surgery   Paroxysmal atrial fibrillation  HR's controlled. Eliquis resumed Continue metoprolol   COPD (chronic obstructive  pulmonary disease) (HCC) Stable, not exacerbated. Continue bronchodilators PRN   Recurrent major depressive disorder (HCC) Appears no longer on Celexa   Iron deficiency anemia Continue PO iron supplement.   Left thigh pain Due to AKA stump infection, with underlying chronic pain  syndrome --Pain control per orders       Anxiety and depression Stable   Adjustment disorder with anxiety Stable   Chronic pain syndrome Continue home Lyrica. Acute pain control as outlined above. Highly suspect hyperalgesia complicating pain control.   GERD (gastroesophageal reflux disease) Continue PPI   Essential hypertension Continue home Imdur, metoprolol (reduced from 50 >> 25 mg BID due to hypotension)   Pressure injury of skin I agree with the wound description as below. --Continue local wound care --Frequent repositioning --Diligent hygiene --Monitor for signs of infection     DVT prophylaxis: Apixaban Code Status: Full Family Communication: None Disposition Plan: Status is: Inpatient Remains inpatient appropriate because: Acute medical issues as above     Level of care: Med-Surg  Consultants:  Vascular surgery Infectious disease  Procedures:  Debridement and wound VAC change on right lower extremity stump   Antimicrobials: Daptomycin Meropenem   Subjective: Seen and examined.  Sleeping on arrival.  No apparent distress  Objective: Vitals:   04/09/23 0919 04/09/23 1045 04/09/23 1100 04/09/23 1200  BP: (!) 146/61 138/71 (!) 102/91 (!) 133/104  Pulse: 77 84 80 79  Resp: 15 17 16 16   Temp: 98 F (36.7 C) (!) 97.4 F (36.3 C)  98 F (36.7 C)  TempSrc: Temporal   Oral  SpO2: 96% 98% 97% 94%  Weight:      Height:        Intake/Output Summary (Last 24 hours) at 04/09/2023 1338 Last data filed at 04/09/2023 1036 Gross per 24 hour  Intake 660.77 ml  Output 1200 ml  Net -539.23 ml    Filed Weights   03/21/23 1410 03/22/23 1006 03/28/23 0953  Weight: 81.6 kg 81.6 kg 79.4 kg    Examination:  General exam: No acute distress.  Chronically ill-appearing Respiratory system: Poor respiratory effort.  Normal work of breathing.  Room air Cardiovascular system: S1-S2, RRR, no murmurs Gastrointestinal system: Soft, NT/ND, normal bowel  sounds Central nervous system: Alert and oriented. No focal neurological deficits. Extremities: Bilateral AKA Skin: No rashes, lesions or ulcers Psychiatry: Judgement and insight appear impaired. Mood & affect flattened.     Data Reviewed: I have personally reviewed following labs and imaging studies  CBC: Recent Labs  Lab 04/04/23 0502 04/07/23 0927 04/09/23 0838  WBC 6.6 5.2 7.0  NEUTROABS 4.1 3.2 4.8  HGB 8.1* 9.2* 10.1*  HCT 27.5* 31.2* 33.5*  MCV 89.3 89.9 87.9  PLT 275 265 308   Basic Metabolic Panel: Recent Labs  Lab 04/04/23 0502 04/07/23 0927 04/09/23 0838  NA 140 139 139  K 4.5 3.9 4.7  CL 104 102 102  CO2 27 27 27   GLUCOSE 89 116* 91  BUN 22 16 20   CREATININE 0.47 0.57 0.50  CALCIUM 7.5* 8.6* 8.9   GFR: Estimated Creatinine Clearance: 70.2 mL/min (by C-G formula based on SCr of 0.5 mg/dL). Liver Function Tests: No results for input(s): "AST", "ALT", "ALKPHOS", "BILITOT", "PROT", "ALBUMIN" in the last 168 hours. No results for input(s): "LIPASE", "AMYLASE" in the last 168 hours. No results for input(s): "AMMONIA" in the last 168 hours. Coagulation Profile: Recent Labs  Lab 04/09/23 0838  INR 1.2   Cardiac Enzymes: Recent Labs  Lab 04/04/23 0502  CKTOTAL 33*   BNP (last 3 results) No results for input(s): "PROBNP" in the last 8760 hours. HbA1C: No results for input(s): "HGBA1C" in the last 72 hours. CBG: No results for input(s): "GLUCAP" in the last 168 hours. Lipid Profile: No results for input(s): "CHOL", "HDL", "LDLCALC", "TRIG", "CHOLHDL", "LDLDIRECT" in the last 72 hours. Thyroid Function Tests: No results for input(s): "TSH", "T4TOTAL", "FREET4", "T3FREE", "THYROIDAB" in the last 72 hours. Anemia Panel: No results for input(s): "VITAMINB12", "FOLATE", "FERRITIN", "TIBC", "IRON", "RETICCTPCT" in the last 72 hours. Sepsis Labs: No results for input(s): "PROCALCITON", "LATICACIDVEN" in the last 168 hours.  No results found for this or  any previous visit (from the past 240 hour(s)).        Radiology Studies: No results found.      Scheduled Meds:  acetaminophen  1,000 mg Oral Q8H   apixaban  5 mg Oral BID   ascorbic acid  500 mg Oral BID   atorvastatin  40 mg Oral QHS   chlorhexidine  60 mL Topical Once   vitamin B-12  1,000 mcg Oral Daily   feeding supplement  237 mL Oral BID BM   ferrous sulfate  325 mg Oral Q breakfast   folic acid  1 mg Oral Daily   isosorbide mononitrate  120 mg Oral Daily   metoprolol tartrate  12.5 mg Oral BID   nystatin cream   Topical BID   oxyCODONE  30 mg Oral Q12H   pantoprazole  40 mg Oral Daily   polyethylene glycol  17 g Oral Daily   pregabalin  200 mg Oral TID   senna-docusate  1 tablet Oral BID   Vitamin D (Ergocalciferol)  50,000 Units Oral Q7 days   zinc sulfate  220 mg Oral Daily   Continuous Infusions:  DAPTOmycin (CUBICIN) 500 mg in sodium chloride 0.9 % IVPB Stopped (04/08/23 1553)   meropenem (MERREM) IV 1 g (04/09/23 0534)     LOS: 19 days    Tresa Moore, MD [facility]   If 7PM-7AM, please contact night-coverage  04/09/2023, 1:38 PM

## 2023-04-09 NOTE — Progress Notes (Signed)
Date of Admission:  03/21/2023      ID: Carla [name] is a 62 y.o. female  Principal Problem:   Amputation stump infection (HCC) Active Problems:   Essential hypertension   GERD (gastroesophageal reflux disease)   Chronic pain syndrome   Recurrent major depressive disorder (HCC)   Hypotension   Adjustment disorder with anxiety   Paroxysmal atrial fibrillation (HCC)   COPD (chronic obstructive pulmonary disease) (HCC)   PVD (peripheral vascular disease) (HCC)   Hypokalemia   Chronic anticoagulation   Anxiety and depression   Acute blood loss anemia   Iron deficiency anemia   Pressure injury of skin   Cellulitis of left thigh   Left thigh pain   MRSA infection   Infection due to ESBL-producing Escherichia coli  Was taken back to OR today for debridement and placement of Kerecis sheet synthetic skin graft and wound vac  Subjective: Comfortable No distress currently  Medications:   acetaminophen  1,000 mg Oral Q8H   apixaban  5 mg Oral BID   ascorbic acid  500 mg Oral BID   atorvastatin  40 mg Oral QHS   chlorhexidine  60 mL Topical Once   vitamin B-12  1,000 mcg Oral Daily   feeding supplement  237 mL Oral BID BM   ferrous sulfate  325 mg Oral Q breakfast   folic acid  1 mg Oral Daily   isosorbide mononitrate  120 mg Oral Daily   metoprolol tartrate  12.5 mg Oral BID   nystatin cream   Topical BID   oxyCODONE  40 mg Oral Q12H   pantoprazole  40 mg Oral Daily   polyethylene glycol  17 g Oral Daily   pregabalin  200 mg Oral TID   senna-docusate  1 tablet Oral BID   Vitamin D (Ergocalciferol)  50,000 Units Oral Q7 days   zinc sulfate  220 mg Oral Daily    Objective: Vital signs in last 24 hours: Patient Vitals for the past 24 hrs:  BP Temp Temp src Pulse Resp SpO2  04/09/23 1548 121/67 98.9 F (37.2 C) -- 73 17 94 %  04/09/23 1200 (!) 133/104 98 F (36.7 C) Oral 79 16 94 %  04/09/23 1100 (!) 102/91 -- -- 80 16 97 %  04/09/23 1045 138/71 (!) 97.4 F (36.3  C) -- 84 17 98 %  04/09/23 0919 (!) 146/61 98 F (36.7 C) Temporal 77 15 96 %  04/09/23 0738 132/78 98 F (36.7 C) Oral 76 16 92 %  04/09/23 0635 126/60 98.3 F (36.8 C) -- 73 -- 97 %     PHYSICAL EXAM:  General: Alert, cooperative, no distress Lungs: Clear to auscultation bilaterally. No Wheezing or Rhonchi. No rales. Heart:s1s2 Abdomen: Soft, non-tender,not distended. Bowel sounds normal. No masses Extremities: b/l AKA- - Left stump- wound vac      R stump  small wound almost closed Skin: No rashes or lesions. Or bruising Lymph: Cervical, supraclavicular normal. Neurologic: Grossly non-focal  Lab Results    Latest Ref Rng & Units 04/09/2023    8:38 AM 04/07/2023    9:27 AM 04/04/2023    5:02 AM  CBC  WBC 4.0 - 10.5 K/uL 7.0  5.2  6.6   Hemoglobin 12.0 - 15.0 g/dL 16.1  9.2  8.1   Hematocrit 36.0 - 46.0 % 33.5  31.2  27.5   Platelets 150 - 400 K/uL 308  265  275        Latest  Ref Rng & Units 04/09/2023    8:38 AM 04/07/2023    9:27 AM 04/04/2023    5:02 AM  CMP  Glucose 70 - 99 mg/dL 91  161  89   BUN 8 - 23 mg/dL 20  16  22    Creatinine 0.44 - 1.00 mg/dL 0.96  0.45  4.09   Sodium 135 - 145 mmol/L 139  139  140   Potassium 3.5 - 5.1 mmol/L 4.7  3.9  4.5   Chloride 98 - 111 mmol/L 102  102  104   CO2 22 - 32 mmol/L 27  27  27    Calcium 8.9 - 10.3 mg/dL 8.9  8.6  7.5       Microbiology: Left AKA stump  surgical wound has ESBL ecoli and proteus in the wound MRSA     Assessment/Plan: 62 y.o. female with a history of A-fib, CHF, PAD, COPD, HLD, hypertension, right AKA, left leg irreversible ischemia April 2024 and was in [facility] 02/15/23-02/24/23 needing left above-the-knee amputation on 02/21/2023 She went for a vascular follow up appt on 03/21/23 and they noted the surgical site was necrotic and had purulent discharge. She was asked to get admitted   Left AKA stump infection- ESBL ecoli and proteus and MRSA  in the  wound Currently on meropenem and daptomycin   She  is going to need Iv antibitcs for 4 -6 weeks Will need IV antibiotic ( ertapenem and dapto on discharge) would need until 05/04/23   Rt AKA stump= wound Is very small and almost closed Had chronic infection for 3 months ( MRSA, ESBL, enterococcus) and took many courses of antibiotics   Current smoker Severe PAD with stents   CAD Imdur Metoprolol atorvastatin   HTN? ? AFIB controlled- on eliquis   Anemia    Discussed the management with the patient and hospitalist Will place OPAT orders

## 2023-04-09 NOTE — Anesthesia Procedure Notes (Signed)
Procedure Name: MAC Date/Time: 04/09/2023 10:10 AM  Performed by: Elmarie Mainland, CRNAPre-anesthesia Checklist: Patient identified, Emergency Drugs available, Suction available and Patient being monitored Patient Re-evaluated:Patient Re-evaluated prior to induction Oxygen Delivery Method: Simple face mask

## 2023-04-10 ENCOUNTER — Encounter: Payer: Self-pay | Admitting: Vascular Surgery

## 2023-04-10 DIAGNOSIS — T8740 Infection of amputation stump, unspecified extremity: Secondary | ICD-10-CM | POA: Diagnosis not present

## 2023-04-10 MED ORDER — HYDROMORPHONE HCL 1 MG/ML IJ SOLN
0.5000 mg | INTRAMUSCULAR | Status: DC | PRN
  Administered 2023-04-10 – 2023-04-17 (×25): 0.5 mg via INTRAVENOUS
  Filled 2023-04-10 (×26): qty 1

## 2023-04-10 NOTE — TOC Progression Note (Signed)
Transition of Care [facility]) - Progression Note    Patient Details  Name: Carla [name] MRN: [id number] Date of Birth: 1961-04-13  Transition of Care [facility]) CM/SW Contact  Garret Reddish, RN Phone Number: 04/10/2023, 9:19 PM  Clinical Narrative:   Informed by Amalia Hailey with Pharmacy that patient will require home IV antibiotics on discharge.  Dustin reached out to verify the disposition plan.    I have spoken with [name].  She informs me that she would prefer to go home.  She reports that he daughter Gearldine Bienenstock will be help her with the IV infusion and wound vac at home.  [name] reports that she is active with [facility] but would like to have an RN come to the home instead of an LPN.    [name] reports that prior to admission she lived at home with her daughter.  She informed me that she had a hoyer lift, wheelchair, hospital bed, and a shower chair at home.    I have spoken to [name] about STR to assist with the IV infusion and wound vac.  She informs me that she has spoken to a previous [facility] worker about her daughter getting paid to care for her at home.  I have informed [name] that previous [facility] worker stated in her note that she has contacted CAP office and the CAP office would mail an application to patient's home for her daughter to complete.  [name] reports that her daughter did receive the application but has not turned back in to the CAP office.  I informed [name] that the application process to pay her daughter may not be complete prior to her discharging home.  She informs me that her daughter will still be able to assist her with the care at home.    I have confirmed with Barbara Cower with [facility] that their agency is not able to handle the care that [name] will require at home.    I have asked [facility] and [facility] to consider patient for home health services. These agencies are not able to accept patient at this time.  TOC will continue to reach out to [facility] agencies to see if  anyone can accept patient for [facility] services for IV infusion, wound vac, OT and in home aide.  TOC will continue to follow for discharge planning.      Expected Discharge Plan: Home w Home Health Services Barriers to Discharge: Continued Medical Work up  Expected Discharge Plan and Services     Post Acute Care Choice: Resumption of Svcs/PTA Provider Living arrangements for the past 2 months: Single Family Home                           HH Arranged: RN, PT, OT [facility] Agency: [facility] ([facility]) Date HH Agency Contacted: 03/22/23   Representative spoke with at [facility] Agency: Feliberto Gottron   Social Determinants of Health (SDOH) Interventions SDOH Screenings   Food Insecurity: No Food Insecurity (03/22/2023)  Housing: Low Risk  (03/22/2023)  Transportation Needs: No Transportation Needs (03/22/2023)  Utilities: Not At Risk (03/22/2023)  Depression (PHQ2-9): Low Risk  (01/25/2023)  Tobacco Use: Medium Risk (04/10/2023)    Readmission Risk Interventions    03/23/2023   12:17 PM 12/06/2022    1:31 PM 07/25/2022   10:49 AM  Readmission Risk Prevention Plan  Transportation Screening Complete Complete Complete  Medication Review (RN Care Manager) Complete Complete Complete  PCP or Specialist appointment within 3-5 days of discharge Complete  Complete  HRI or Home Care Consult Complete  Complete  SW Recovery Care/Counseling Consult Complete Complete Not Complete  SW Consult Not Complete Comments   NA  Palliative Care Screening Not Applicable Not Applicable   Skilled Nursing Facility Not Applicable  Patient Refused

## 2023-04-10 NOTE — Progress Notes (Signed)
PROGRESS NOTE    Carla [name]  WGN:[id number] DOB: 1961/06/18 DOA: 03/21/2023 PCP: [facility]    Brief Narrative:  62 year old female with history of depression, anxiety, hyperlipidemia, hypertension, tobacco use, paroxysmal atrial fibrillation on Eliquis, obesity, atherosclerosis, CAD, PAD, history of left stump infection post amputation, who presented on 03/21/2023 from vascular outpatient for due to necrosis of the left AKA stump.   Pt was admitted and started on IV antibiotics. Vascular surgery was consulted and took patient to the OR on 5/30 for debridement and wound vac placement.   6/5 s/p debridement done by vascular surgery 6/10 irrigation and debridement of skin and soft tissue approximately 130 cm to the left AKA stump.  Negative pressure dressing placement   Assessment & Plan:   Principal Problem:   Amputation stump infection (HCC) Active Problems:   Hypotension   Hypokalemia   Chronic anticoagulation   PVD (peripheral vascular disease) (HCC)   Acute blood loss anemia   Paroxysmal atrial fibrillation (HCC)   COPD (chronic obstructive pulmonary disease) (HCC)   Recurrent major depressive disorder (HCC)   Iron deficiency anemia   Essential hypertension   GERD (gastroesophageal reflux disease)   Chronic pain syndrome   Adjustment disorder with anxiety   Anxiety and depression   Pressure injury of skin   Cellulitis of left thigh   Left thigh pain   MRSA infection   Infection due to ESBL-producing Escherichia coli   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 coli and Enterococcus and MRSA needing revision of the amputation site and IV antibiotics followed by oral omadacycline. Left leg irreversible ischemia April 2024 and was in [facility] 02/15/23-02/24/23 needing left above-the-knee amputation on 02/21/2023 She went for a vascular follow up appt on 03/21/23 and they noted the surgical  site was necrotic and had purulent discharge. She was asked to get admitted 5/30 - taken to OR for debridement and wound vac placement 6/1 - wound cultures growing ESBL E coli 6/5 s/p debridement and  wound vac change. 6/10 irrigation and debridement of skin and soft tissue approximately 130 cm to the left AKA stump.  Negative pressure dressing placement  Plan: Continue broad-spectrum IV antibiotics meropenem and daptomycin.  Per infectious disease will need 4 to 6 weeks.  At time of discharge will continue daptomycin and switch meropenem for ertapenem.    Continue multimodal pain control.  Increase long-acting oxycodone.  Start to taper off intravenous Dilaudid.    Per vascular surgery patient will return to the operating room for [facility] change on 6/20  Will need PICC line.  Waiting till no further trips to operating room to place.     Hypotension, resolved Likely due to pain meds and infection. Low BP's have led to pain meds needing to be held.   --Reduce metoprolol 50 >> 25 >> 12.5 mg BID --s/p midodrine 5 mg TID, DC'd on 6/5 -Blood pressure stable     Hypokalemia Monitor and replace as necessary   Chronic anticoagulation Continue Eliquis   Acute blood loss anemia Hbg on admission was 12.2, trended down post-operatively  No signs of active bleeding, some serosanginous drainage in wound vac canister. Anemia panel c/w iron deficiency.  Plan: Continue B12 and folate supplementation Monitor hemoglobin    PVD (peripheral vascular disease)  Complicated by bilateral AKA's. Mgmt per vascular surgery   Paroxysmal atrial fibrillation  HR's controlled. Eliquis resumed Continue metoprolol   COPD (chronic  obstructive pulmonary disease) (HCC) Stable, not exacerbated. Continue bronchodilators PRN   Recurrent major depressive disorder (HCC) Appears no longer on Celexa   Iron deficiency anemia Continue PO iron supplement.   Left thigh pain Due to AKA stump infection, with  underlying chronic pain syndrome --Pain control per orders   Anxiety and depression Stable   Adjustment disorder with anxiety Stable   Chronic pain syndrome Continue home Lyrica. Acute pain control as outlined above. Highly suspect hyperalgesia complicating pain control.   GERD (gastroesophageal reflux disease) Continue PPI   Essential hypertension Continue home Imdur, metoprolol (reduced from 50 >> 25 mg BID due to hypotension)   Pressure injury of skin WOC consult.  Frequent local wound care.  Frequent repositioning   DVT prophylaxis: Apixaban Code Status: Full Family Communication: None Disposition Plan: Status is: Inpatient Remains inpatient appropriate because: Acute medical issues as above     Level of care: Med-Surg  Consultants:  Vascular surgery Infectious disease  Procedures:  Debridement and wound VAC change on right lower extremity stump   Antimicrobials: Daptomycin Meropenem   Subjective: Seen and examined.  Sleeping on arrival.  No apparent distress.  Does awaken and endorse pain almost immediately  Objective: Vitals:   04/09/23 1200 04/09/23 1548 04/09/23 2009 04/10/23 0800  BP: (!) 133/104 121/67 (!) 118/57 132/82  Pulse: 79 73 83 80  Resp: 16 17 18 18   Temp: 98 F (36.7 C) 98.9 F (37.2 C) 98.7 F (37.1 C) 98.7 F (37.1 C)  TempSrc: Oral  Oral Oral  SpO2: 94% 94% 96% 95%  Weight:      Height:        Intake/Output Summary (Last 24 hours) at 04/10/2023 1117 Last data filed at 04/10/2023 0835 Gross per 24 hour  Intake 1360 ml  Output 2420 ml  Net -1060 ml    Filed Weights   03/21/23 1410 03/22/23 1006 03/28/23 0953  Weight: 81.6 kg 81.6 kg 79.4 kg    Examination:  General exam: Tearful.  Chronically ill-appearing Respiratory system: Poor respiratory effort.  Normal work of breathing.  Room air Cardiovascular system: S1-S2, RRR, no murmurs Gastrointestinal system: Soft, NT/ND, normal bowel sounds Central nervous system:  Alert and oriented. No focal neurological deficits. Extremities: Bilateral AKA Skin: No rashes, lesions or ulcers Psychiatry: Judgement and insight appear impaired. Mood & affect tearful.     Data Reviewed: I have personally reviewed following labs and imaging studies  CBC: Recent Labs  Lab 04/04/23 0502 04/07/23 0927 04/09/23 0838  WBC 6.6 5.2 7.0  NEUTROABS 4.1 3.2 4.8  HGB 8.1* 9.2* 10.1*  HCT 27.5* 31.2* 33.5*  MCV 89.3 89.9 87.9  PLT 275 265 308   Basic Metabolic Panel: Recent Labs  Lab 04/04/23 0502 04/07/23 0927 04/09/23 0838  NA 140 139 139  K 4.5 3.9 4.7  CL 104 102 102  CO2 27 27 27   GLUCOSE 89 116* 91  BUN 22 16 20   CREATININE 0.47 0.57 0.50  CALCIUM 7.5* 8.6* 8.9   GFR: Estimated Creatinine Clearance: 70.2 mL/min (by C-G formula based on SCr of 0.5 mg/dL). Liver Function Tests: No results for input(s): "AST", "ALT", "ALKPHOS", "BILITOT", "PROT", "ALBUMIN" in the last 168 hours. No results for input(s): "LIPASE", "AMYLASE" in the last 168 hours. No results for input(s): "AMMONIA" in the last 168 hours. Coagulation Profile: Recent Labs  Lab 04/09/23 0838  INR 1.2   Cardiac Enzymes: Recent Labs  Lab 04/04/23 0502  CKTOTAL 33*   BNP (last 3 results)  No results for input(s): "PROBNP" in the last 8760 hours. HbA1C: No results for input(s): "HGBA1C" in the last 72 hours. CBG: No results for input(s): "GLUCAP" in the last 168 hours. Lipid Profile: No results for input(s): "CHOL", "HDL", "LDLCALC", "TRIG", "CHOLHDL", "LDLDIRECT" in the last 72 hours. Thyroid Function Tests: No results for input(s): "TSH", "T4TOTAL", "FREET4", "T3FREE", "THYROIDAB" in the last 72 hours. Anemia Panel: No results for input(s): "VITAMINB12", "FOLATE", "FERRITIN", "TIBC", "IRON", "RETICCTPCT" in the last 72 hours. Sepsis Labs: No results for input(s): "PROCALCITON", "LATICACIDVEN" in the last 168 hours.  No results found for this or any previous visit (from the past  240 hour(s)).        Radiology Studies: No results found.      Scheduled Meds:  acetaminophen  1,000 mg Oral Q8H   apixaban  5 mg Oral BID   ascorbic acid  500 mg Oral BID   atorvastatin  40 mg Oral QHS   chlorhexidine  60 mL Topical Once   vitamin B-12  1,000 mcg Oral Daily   feeding supplement  237 mL Oral BID BM   ferrous sulfate  325 mg Oral Q breakfast   folic acid  1 mg Oral Daily   isosorbide mononitrate  120 mg Oral Daily   metoprolol tartrate  12.5 mg Oral BID   nystatin cream   Topical BID   oxyCODONE  40 mg Oral Q12H   pantoprazole  40 mg Oral Daily   polyethylene glycol  17 g Oral Daily   pregabalin  200 mg Oral TID   senna-docusate  1 tablet Oral BID   Vitamin D (Ergocalciferol)  50,000 Units Oral Q7 days   zinc sulfate  220 mg Oral Daily   Continuous Infusions:  DAPTOmycin (CUBICIN) 500 mg in sodium chloride 0.9 % IVPB Stopped (04/09/23 1443)   ertapenem 1,000 mg (04/10/23 0606)     LOS: 20 days    Tresa Moore, MD [facility]   If 7PM-7AM, please contact night-coverage  04/10/2023, 11:17 AM

## 2023-04-10 NOTE — Progress Notes (Signed)
Progress Note    04/10/2023 1:36 PM 1 Day Post-Op  Subjective:  Carla [name] is a 62 year old female who presented to the vein and vascular clinic with an infected left stump status post left above-the-knee amputation 02/21/2023.  Patient returns to clinic on 03/21/23 for evaluation of the left stump.  She is found to have an infection with drainage.  Patient was sent to the emergency room for further workup.  Patient was then taken to the OR on 03/22/2023 for debridement and wound VAC placement.  Patient then had her second wound VAC change on 03/28/2023. Patient had her third wound vac change on 04/02/2023. Patient had her fourth wound vac change on 04/05/2023. Patient had her fifth wound vac change with Kerecis placed on 04/09/2023   Patient is resting comfortably in bed this morning sleeping. She continues to endorse strong 7-8 out of 10 pain where the wound vac is placed while awake. No other complaints overnight. Vitals all remain stable.    Vitals:   04/09/23 2009 04/10/23 0800  BP: (!) 118/57 132/82  Pulse: 83 80  Resp: 18 18  Temp: 98.7 F (37.1 C) 98.7 F (37.1 C)  SpO2: 96% 95%   Physical Exam: Cardiac:  HX Atrial fibrillation rate in the 90's Lungs:  Clear on auscultation throughout.  Incisions:  Left stump incision dressed with wound vac. Working well Extremities:  Bilateral lower extremity AKA's. Left with infection and now wound vac  Abdomen:  Positive bowel sounds. Right lower quadrant hernia.  Neurologic: AAOX3 follows commands.  CBC    Component Value Date/Time   WBC 7.0 04/09/2023 0838   RBC 3.81 (L) 04/09/2023 0838   HGB 10.1 (L) 04/09/2023 0838   HGB 13.4 02/12/2015 1137   HCT 33.5 (L) 04/09/2023 0838   HCT 40.9 02/12/2015 1137   PLT 308 04/09/2023 0838   PLT 272 02/12/2015 1137   MCV 87.9 04/09/2023 0838   MCV 90 02/12/2015 1137   MCH 26.5 04/09/2023 0838   MCHC 30.1 04/09/2023 0838   RDW 17.4 (H) 04/09/2023 0838   RDW 14.8 (H) 02/12/2015 1137   LYMPHSABS  1.6 04/09/2023 0838   LYMPHSABS 1.7 08/27/2014 1145   MONOABS 0.4 04/09/2023 0838   MONOABS 0.4 08/27/2014 1145   EOSABS 0.3 04/09/2023 0838   EOSABS 0.0 08/27/2014 1145   BASOSABS 0.0 04/09/2023 0838   BASOSABS 0.0 08/27/2014 1145    BMET    Component Value Date/Time   NA 139 04/09/2023 0838   NA 140 02/12/2015 1137   K 4.7 04/09/2023 0838   K 2.9 (L) 02/12/2015 1137   CL 102 04/09/2023 0838   CL 107 02/12/2015 1137   CO2 27 04/09/2023 0838   CO2 27 02/12/2015 1137   GLUCOSE 91 04/09/2023 0838   GLUCOSE 120 (H) 02/12/2015 1137   BUN 20 04/09/2023 0838   BUN 15 02/12/2015 1137   CREATININE 0.50 04/09/2023 0838   CREATININE 0.78 02/12/2015 1137   CALCIUM 8.9 04/09/2023 0838   CALCIUM 9.0 02/12/2015 1137   GFRNONAA >60 04/09/2023 0838   GFRNONAA >60 02/12/2015 1137   GFRAA >60 11/23/2019 1330   GFRAA >60 02/12/2015 1137    INR    Component Value Date/Time   INR 1.2 04/09/2023 0838   INR 0.8 07/23/2012 0433     Intake/Output Summary (Last 24 hours) at 04/10/2023 1336 Last data filed at 04/10/2023 0835 Gross per 24 hour  Intake 1360 ml  Output 2420 ml  Net -1060 ml  Assessment/Plan:  62 y.o. female is s/p Left Lower extremity AKA wound washout with Vac re-application  and Kerecis biologic placed. 1 Day Post-Op   PLAN: Patient will be taken back to the operating room for first wound VAC change on Monday April 16, 2023.  Will most likely entail wound washout with Kerecis biologic placed.  Patient is aware and agrees to the timeline.    DVT prophylaxis:  Eliquis 5 mg twice daily    [name] [facility] 04/10/2023 1:36 PM

## 2023-04-11 ENCOUNTER — Encounter: Payer: Self-pay | Admitting: Vascular Surgery

## 2023-04-11 DIAGNOSIS — E876 Hypokalemia: Secondary | ICD-10-CM

## 2023-04-11 DIAGNOSIS — I9589 Other hypotension: Secondary | ICD-10-CM

## 2023-04-11 DIAGNOSIS — G894 Chronic pain syndrome: Secondary | ICD-10-CM

## 2023-04-11 DIAGNOSIS — I10 Essential (primary) hypertension: Secondary | ICD-10-CM

## 2023-04-11 DIAGNOSIS — I480 Paroxysmal atrial fibrillation: Secondary | ICD-10-CM

## 2023-04-11 DIAGNOSIS — F4322 Adjustment disorder with anxiety: Secondary | ICD-10-CM

## 2023-04-11 DIAGNOSIS — T8740 Infection of amputation stump, unspecified extremity: Secondary | ICD-10-CM | POA: Diagnosis not present

## 2023-04-11 DIAGNOSIS — D508 Other iron deficiency anemias: Secondary | ICD-10-CM

## 2023-04-11 DIAGNOSIS — D62 Acute posthemorrhagic anemia: Secondary | ICD-10-CM

## 2023-04-11 DIAGNOSIS — Z7901 Long term (current) use of anticoagulants: Secondary | ICD-10-CM | POA: Diagnosis not present

## 2023-04-11 DIAGNOSIS — J449 Chronic obstructive pulmonary disease, unspecified: Secondary | ICD-10-CM

## 2023-04-11 DIAGNOSIS — K219 Gastro-esophageal reflux disease without esophagitis: Secondary | ICD-10-CM

## 2023-04-11 LAB — CBC WITH DIFFERENTIAL/PLATELET
Abs Immature Granulocytes: 0.02 10*3/uL (ref 0.00–0.07)
Basophils Absolute: 0 10*3/uL (ref 0.0–0.1)
Basophils Relative: 1 %
Eosinophils Absolute: 0.4 10*3/uL (ref 0.0–0.5)
Eosinophils Relative: 7 %
HCT: 27.4 % — ABNORMAL LOW (ref 36.0–46.0)
Hemoglobin: 8.2 g/dL — ABNORMAL LOW (ref 12.0–15.0)
Immature Granulocytes: 0 %
Lymphocytes Relative: 32 %
Lymphs Abs: 1.6 10*3/uL (ref 0.7–4.0)
MCH: 26.9 pg (ref 26.0–34.0)
MCHC: 29.9 g/dL — ABNORMAL LOW (ref 30.0–36.0)
MCV: 89.8 fL (ref 80.0–100.0)
Monocytes Absolute: 0.4 10*3/uL (ref 0.1–1.0)
Monocytes Relative: 8 %
Neutro Abs: 2.7 10*3/uL (ref 1.7–7.7)
Neutrophils Relative %: 52 %
Platelets: 307 10*3/uL (ref 150–400)
RBC: 3.05 MIL/uL — ABNORMAL LOW (ref 3.87–5.11)
RDW: 17.3 % — ABNORMAL HIGH (ref 11.5–15.5)
WBC: 5.2 10*3/uL (ref 4.0–10.5)
nRBC: 0 % (ref 0.0–0.2)

## 2023-04-11 LAB — BASIC METABOLIC PANEL
Anion gap: 8 (ref 5–15)
BUN: 20 mg/dL (ref 8–23)
CO2: 26 mmol/L (ref 22–32)
Calcium: 8.5 mg/dL — ABNORMAL LOW (ref 8.9–10.3)
Chloride: 103 mmol/L (ref 98–111)
Creatinine, Ser: 0.54 mg/dL (ref 0.44–1.00)
GFR, Estimated: >60 mL/min (ref >60)
Glucose, Bld: 100 mg/dL — ABNORMAL HIGH (ref 70–99)
Potassium: 4.8 mmol/L (ref 3.5–5.1)
Sodium: 137 mmol/L (ref 135–145)

## 2023-04-11 LAB — CK: Total CK: 14 U/L — ABNORMAL LOW (ref 38–234)

## 2023-04-11 MED ORDER — OXYCODONE HCL 5 MG PO TABS
15.0000 mg | ORAL_TABLET | Freq: Four times a day (QID) | ORAL | Status: DC | PRN
  Administered 2023-04-11 – 2023-04-17 (×13): 15 mg via ORAL
  Filled 2023-04-11 (×14): qty 3

## 2023-04-11 MED ORDER — KETOROLAC TROMETHAMINE 15 MG/ML IJ SOLN
15.0000 mg | Freq: Once | INTRAMUSCULAR | Status: AC
  Administered 2023-04-11: 15 mg via INTRAVENOUS
  Filled 2023-04-11: qty 1

## 2023-04-11 NOTE — Progress Notes (Signed)
Progress Note   Patient: Carla [name] CWC:[id number] DOB: 09-08-1961 DOA: 03/21/2023     21 DOS: the patient was seen and examined on 04/11/2023   Brief hospital course: [name] is a 62 year old female with history of depression, anxiety, hyperlipidemia, hypertension, tobacco use, paroxysmal atrial fibrillation on Eliquis, obesity, atherosclerosis, CAD, PAD, history of left stump infection post amputation, who presented on 03/21/2023 from vascular outpatient for due to necrosis of the left AKA stump.  Pt was admitted and started on IV antibiotics. Vascular surgery was consulted and took patient to the OR on 5/30 for debridement and wound vac placement.   6/5 status postdebridement by vascular surgery in the operating room. 6/10.  Irrigation debridement of skin and soft tissue done by vascular surgery.  6/19.  Vascular surgery planning to take back to the operating room on 6/24 to change wound VAC.  Assessment and Plan: * Amputation stump infection (HCC) 5/30 - taken to OR for debridement and wound vac placement 6/1 - wound cultures growing ESBL E coli 6/5 -went to the OR to change wound VAC and debridement 6/5.  Wound culture growing MRSA and Proteus 6/10.  Irrigation debridement of left AKA stump. -Patient currently on ertapenem and daptomycin.  Plan will be ertapenem and daptomycin upon discharge.  Antibiotics will go through 7/12. -Pain control.  Increase oxycodone to 15 mg every 6 hours as needed.  Hypokalemia Replaced  Hypotension Likely due to pain meds and infection.  Midodrine tapered off.  On lower dose metoprolol.  Chronic anticoagulation Continue Eliquis  Acute blood loss anemia Last hemoglobin 8.2.  Last ferritin 14.  PVD (peripheral vascular disease) (HCC) Continue Eliquis  Paroxysmal atrial fibrillation (HCC) Continue Eliquis and metoprolol  COPD (chronic obstructive pulmonary disease) (HCC) Respiratory status stable  Recurrent major depressive  disorder (HCC) Appears no longer on Celexa  Iron deficiency anemia Continue iron supplement.  Left thigh pain Due to AKA stump infection, with underlying chronic pain syndrome --Pain control per orders  Pressure injury of skin Present on admission.  See full description below.     Anxiety and depression Appears no longer on Celexa  Adjustment disorder with anxiety Appears no longer taking Celexa  Chronic pain syndrome Continue home Lyrica. Patient on high-dose OxyContin.  Increase oxycodone to 15 mg every 6 hours  GERD (gastroesophageal reflux disease) Continue PPI  Essential hypertension Continue home Imdur, metoprolol         Subjective: Patient complaining of a lot of pain in her left leg.  She states that she takes oxycodone 10 mg at home along with OxyContin.  Admitted with left stump infection.  Physical Exam: Vitals:   04/10/23 1500 04/10/23 2112 04/11/23 0419 04/11/23 0842  BP: 129/73 (!) 97/51 (!) 104/58 133/65  Pulse: 86 75 68 69  Resp: 17 18 20 16   Temp: 98.7 F (37.1 C) 98.1 F (36.7 C) 98.9 F (37.2 C) 98.6 F (37 C)  TempSrc:  Oral Oral Oral  SpO2:  95% 94% 97%  Weight:      Height:       Physical Exam HENT:     Head: Normocephalic.     Mouth/Throat:     Pharynx: No oropharyngeal exudate.  Eyes:     General: Lids are normal.     Conjunctiva/sclera: Conjunctivae normal.  Cardiovascular:     Rate and Rhythm: Normal rate and regular rhythm.     Heart sounds: Normal heart sounds, S1 normal and S2 normal.  Pulmonary:  Breath sounds: No decreased breath sounds or wheezing.  Abdominal:     Palpations: Abdomen is soft.     Tenderness: There is no abdominal tenderness.  Musculoskeletal:     Right Lower Extremity: Right leg is amputated above knee.     Left Lower Extremity: Left leg is amputated above knee.  Skin:    General: Skin is warm.     Comments: Wound VAC covering left leg.  Neurological:     Mental Status: She is alert and  oriented to person, place, and time.     Data Reviewed: Creatinine 0.54, hemoglobin 8.2  Family Communication: Updated patient's daughter on the phone  Disposition: Status is: Inpatient Remains inpatient appropriate because: Will have wound VAC change on Monday in the operating room.  Planned Discharge Destination: Home with Home Health    Time spent: 28 minutes  Author: Alford Highland, MD 04/11/2023 3:18 PM  For on call review [email or web address].

## 2023-04-11 NOTE — Progress Notes (Signed)
Progress Note    04/11/2023 3:24 PM 2 Days Post-Op  Subjective:  Carla [name] is a 62 year old female who presented to the vein and vascular clinic with an infected left stump status post left above-the-knee amputation 02/21/2023.  Patient returns to clinic on 03/21/23 for evaluation of the left stump.  She is found to have an infection with drainage.  Patient was sent to the emergency room for further workup.  Patient was then taken to the OR on 03/22/2023 for debridement and wound VAC placement.  Patient then had her second wound VAC change on 03/28/2023. Patient had her third wound vac change on 04/02/2023. Patient had her fourth wound vac change on 04/05/2023. Patient had her fifth wound vac change with Kerecis placed on 04/09/2023   Patient is resting comfortably in bed this morning eating breakfast. She continues to endorse strong 7-8 out of 10 pain where the wound vac is placed while awake. No other complaints overnight. Vitals all remain stable.    Vitals:   04/11/23 0419 04/11/23 0842  BP: (!) 104/58 133/65  Pulse: 68 69  Resp: 20 16  Temp: 98.9 F (37.2 C) 98.6 F (37 C)  SpO2: 94% 97%   Physical Exam: Cardiac:  HX Atrial fibrillation rate in the 90's Lungs:  Clear on auscultation throughout.  Incisions:  Left stump incision dressed with wound vac. Working well Extremities:  Bilateral lower extremity AKA's. Left with infection and now wound vac  Abdomen:  Positive bowel sounds. Right lower quadrant hernia.  Neurologic: AAOX3 follows commands.  CBC    Component Value Date/Time   WBC 5.2 04/11/2023 0515   RBC 3.05 (L) 04/11/2023 0515   HGB 8.2 (L) 04/11/2023 0515   HGB 13.4 02/12/2015 1137   HCT 27.4 (L) 04/11/2023 0515   HCT 40.9 02/12/2015 1137   PLT 307 04/11/2023 0515   PLT 272 02/12/2015 1137   MCV 89.8 04/11/2023 0515   MCV 90 02/12/2015 1137   MCH 26.9 04/11/2023 0515   MCHC 29.9 (L) 04/11/2023 0515   RDW 17.3 (H) 04/11/2023 0515   RDW 14.8 (H) 02/12/2015 1137    LYMPHSABS 1.6 04/11/2023 0515   LYMPHSABS 1.7 08/27/2014 1145   MONOABS 0.4 04/11/2023 0515   MONOABS 0.4 08/27/2014 1145   EOSABS 0.4 04/11/2023 0515   EOSABS 0.0 08/27/2014 1145   BASOSABS 0.0 04/11/2023 0515   BASOSABS 0.0 08/27/2014 1145    BMET    Component Value Date/Time   NA 137 04/11/2023 0515   NA 140 02/12/2015 1137   K 4.8 04/11/2023 0515   K 2.9 (L) 02/12/2015 1137   CL 103 04/11/2023 0515   CL 107 02/12/2015 1137   CO2 26 04/11/2023 0515   CO2 27 02/12/2015 1137   GLUCOSE 100 (H) 04/11/2023 0515   GLUCOSE 120 (H) 02/12/2015 1137   BUN 20 04/11/2023 0515   BUN 15 02/12/2015 1137   CREATININE 0.54 04/11/2023 0515   CREATININE 0.78 02/12/2015 1137   CALCIUM 8.5 (L) 04/11/2023 0515   CALCIUM 9.0 02/12/2015 1137   GFRNONAA >60 04/11/2023 0515   GFRNONAA >60 02/12/2015 1137   GFRAA >60 11/23/2019 1330   GFRAA >60 02/12/2015 1137    INR    Component Value Date/Time   INR 1.2 04/09/2023 0838   INR 0.8 07/23/2012 0433     Intake/Output Summary (Last 24 hours) at 04/11/2023 1524 Last data filed at 04/11/2023 1044 Gross per 24 hour  Intake 1540 ml  Output 1900 ml  Net -360  ml     Assessment/Plan:  62 y.o. female is s/p Left Lower extremity AKA wound washout with Vac re-application  and Kerecis biologic placed  2 Days Post-Op   PLAN: Patient will be taken back to the operating room for first wound VAC change on Monday April 16, 2023.  Will most likely entail wound washout with Kerecis biologic placed.  Patient is aware and agrees to the timeline.    DVT prophylaxis:  Eliquis 5 mg twice daily   [name] [facility] 04/11/2023 3:24 PM

## 2023-04-11 NOTE — Care Management Important Message (Signed)
Important Message  Patient Details  Name: Carla [name] MRN: [id number] Date of Birth: 1960-11-18   Medicare Important Message Given:  Yes  Patient is in an isolation room so I reviewed the Important Message from Medicare by phone with her [phone]). She stated she understood these rights and I wished her well and thanked her for her time.   Olegario Messier A Taylah Dubiel 04/11/2023, 1:19 PM

## 2023-04-11 NOTE — Consult Note (Signed)
Pharmacy Antibiotic Note  Carla [name] is a 62 y.o. female admitted on 03/21/2023 with  wound infection . Patient was sent to ED by vascular given concern for possible osteomyelitis. Patient has received multiple IV antibiotics this year including meropenem, daptomycin, and ertapenem in February and March. She has also received other oral agents including omadacycline and metronidazole this year. Historical wound cultures have grown resistant bacteria including MRSA and ESBL E. coli. Pharmacy consulted to dose daptomycin and meropenem for L AKA stump wound infection. Discussed GPC from 5/30 culture with micro lab and they were unable to isolate that organisms. 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   S/P wound vac exchange on 6/5- repeat cultures sent from OR. Noted patient is on atorvastatin 80 mg for PVD. Reduced to atorvastatin 40mg .   -  baseline CK 6/5 = 14 - 6/5 wound culture with S aureus isolated, susceptibilities pending. Also proteus mirabilis which was seen in previous culture  Most recent Wound vac exchange and I&D was on 6/17, next trip to OR planned for 6/24 6/19 CK = 14  Plan: Day # 15 daptomycin/Day# 19 on meropenem/ertapenem Continue ertapenem 1 g IV q24H Continue daptomycin 500 mg IV Q24H (~8 mg/kg AdjBW for BMI>30) Weekly CK Monitor renal function CK weekly, F/U discharge plans  OPAT completed for daptomycin and ertapenem (will need IV abx until 05/04/23)  Height: 5\' 2"  (157.5 cm) Weight: 79.4 kg (175 lb) IBW/kg (Calculated) : 50.1  Temp (24hrs), Avg:98.6 F (37 C), Min:98.1 F (36.7 C), Max:98.9 F (37.2 C)  Recent Labs  Lab 04/07/23 0927 04/09/23 0838 04/11/23 0515  WBC 5.2 7.0 5.2  CREATININE 0.57 0.50 0.54     Estimated Creatinine Clearance: 70.2 mL/min (by C-G formula based on SCr of 0.54 mg/dL).    Allergies  Allergen Reactions   Gabapentin     Tremors    Bactrim [Sulfamethoxazole-Trimethoprim]  Itching    Antimicrobials this admission: 5/29 Zosyn x 1  5/29 Vancomycin >> 6/1 5/29 Cefepime >> 6/1 6/01 Meropenem >>c 6/05 daptomycin >>c  Dose adjustments this admission: N/A  Microbiology results: 5/29 BCx: NGTD 5/30 OR cultures: ESBL E. coli (S-imipenem, S-gentamicin) + Rare GPCs (micro lab unable to isolate), GPRs, GNRs 6/5 tissue cx (during VAC change): S aureus, P mirabilis  Thank you for allowing pharmacy to be a part of this patient's care.  Juliette Alcide, PharmD, BCPS, BCIDP Work Cell: [phone] 04/11/2023 1:43 PM

## 2023-04-11 NOTE — Progress Notes (Signed)
Date of Admission:  03/21/2023      ID: Carla [name] is a 62 y.o. female  Principal Problem:   Amputation stump infection (HCC) Active Problems:   Essential hypertension   GERD (gastroesophageal reflux disease)   Chronic pain syndrome   Recurrent major depressive disorder (HCC)   Hypotension   Adjustment disorder with anxiety   Paroxysmal atrial fibrillation (HCC)   COPD (chronic obstructive pulmonary disease) (HCC)   PVD (peripheral vascular disease) (HCC)   Hypokalemia   Chronic anticoagulation   Anxiety and depression   Acute blood loss anemia   Iron deficiency anemia   Pressure injury of skin   Cellulitis of left thigh   Left thigh pain   MRSA infection   Infection due to ESBL-producing Escherichia coli  debridement and placement of Kerecis sheet synthetic skin graft and wound vac Back to OR on 04/16/23 Subjective: Pt doing better  Medications:   acetaminophen  1,000 mg Oral Q8H   apixaban  5 mg Oral BID   ascorbic acid  500 mg Oral BID   atorvastatin  40 mg Oral QHS   chlorhexidine  60 mL Topical Once   vitamin B-12  1,000 mcg Oral Daily   feeding supplement  237 mL Oral BID BM   ferrous sulfate  325 mg Oral Q breakfast   folic acid  1 mg Oral Daily   isosorbide mononitrate  120 mg Oral Daily   metoprolol tartrate  12.5 mg Oral BID   nystatin cream   Topical BID   oxyCODONE  40 mg Oral Q12H   pantoprazole  40 mg Oral Daily   polyethylene glycol  17 g Oral Daily   pregabalin  200 mg Oral TID   senna-docusate  1 tablet Oral BID   Vitamin D (Ergocalciferol)  50,000 Units Oral Q7 days   zinc sulfate  220 mg Oral Daily    Objective: Vital signs in last 24 hours: Patient Vitals for the past 24 hrs:  BP Temp Temp src Pulse Resp SpO2  04/11/23 0842 133/65 98.6 F (37 C) Oral 69 16 97 %  04/11/23 0419 (!) 104/58 98.9 F (37.2 C) Oral 68 20 94 %  04/10/23 2112 (!) 97/51 98.1 F (36.7 C) Oral 75 18 95 %  04/10/23 1500 129/73 98.7 F (37.1 C) -- 86 17 --      PHYSICAL EXAM:  General: Alert, cooperative, no distress Lungs: Clear to auscultation bilaterally. No Wheezing or Rhonchi. No rales. Heart:s1s2 Abdomen: Soft, non-tender,not distended. Bowel sounds normal. No masses Extremities: b/l AKA- - Left stump- wound vac      R stump  small wound almost closed Skin: No rashes or lesions. Or bruising Lymph: Cervical, supraclavicular normal. Neurologic: Grossly non-focal  Lab Results    Latest Ref Rng & Units 04/11/2023    5:15 AM 04/09/2023    8:38 AM 04/07/2023    9:27 AM  CBC  WBC 4.0 - 10.5 K/uL 5.2  7.0  5.2   Hemoglobin 12.0 - 15.0 g/dL 8.2  16.1  9.2   Hematocrit 36.0 - 46.0 % 27.4  33.5  31.2   Platelets 150 - 400 K/uL 307  308  265        Latest Ref Rng & Units 04/11/2023    5:15 AM 04/09/2023    8:38 AM 04/07/2023    9:27 AM  CMP  Glucose 70 - 99 mg/dL 096  91  045   BUN 8 - 23 mg/dL 20  20  16   Creatinine 0.44 - 1.00 mg/dL 1.61  0.96  0.45   Sodium 135 - 145 mmol/L 137  139  139   Potassium 3.5 - 5.1 mmol/L 4.8  4.7  3.9   Chloride 98 - 111 mmol/L 103  102  102   CO2 22 - 32 mmol/L 26  27  27    Calcium 8.9 - 10.3 mg/dL 8.5  8.9  8.6       Microbiology: Left AKA stump  surgical wound has ESBL ecoli and proteus in the wound MRSA     Assessment/Plan: 62 y.o. female with a history of A-fib, CHF, PAD, COPD, HLD, hypertension, right AKA, left leg irreversible ischemia April 2024 and was in [facility]/15/[facility]-02/24/23 needing left above-the-knee amputation on 02/21/2023 She went for a vascular follow up appt on 03/21/23 and they noted the surgical site was necrotic and had purulent discharge. She was asked to get admitted   Left AKA stump infection- ESBL ecoli and proteus and MRSA  in the  wound Currently on meropenem and daptomycin   She is going to need Iv antibitcs for 4 -6 weeks Will need IV antibiotic ( ertapenem and dapto on discharge) would need until 05/04/23 Back to OR on 04/16/23   Rt AKA stump= wound Is very  small and almost closed Had chronic infection for 3 months ( MRSA, ESBL, enterococcus) and took many courses of antibiotics   Current smoker Severe PAD with stents   CAD Imdur Metoprolol atorvastatin   HTN? ? AFIB controlled- on eliquis   Anemia    Discussed the management with the patient and her daughter

## 2023-04-11 NOTE — H&P (View-Only) (Signed)
Progress Note    04/11/2023 3:24 PM 2 Days Post-Op  Subjective:  Carla [name] is a 62-year-old female who presented to the vein and vascular clinic with an infected left stump status post left above-the-knee amputation 02/21/2023.  Patient returns to clinic on 03/21/23 for evaluation of the left stump.  She is found to have an infection with drainage.  Patient was sent to the emergency room for further workup.  Patient was then taken to the OR on 03/22/2023 for debridement and wound VAC placement.  Patient then had her second wound VAC change on 03/28/2023. Patient had her third wound vac change on 04/02/2023. Patient had her fourth wound vac change on 04/05/2023. Patient had her fifth wound vac change with Kerecis placed on 04/09/2023   Patient is resting comfortably in bed this morning eating breakfast. She continues to endorse strong 7-8 out of 10 pain where the wound vac is placed while awake. No other complaints overnight. Vitals all remain stable.    Vitals:   04/11/23 0419 04/11/23 0842  BP: (!) 104/58 133/65  Pulse: 68 69  Resp: 20 16  Temp: 98.9 F (37.2 C) 98.6 F (37 C)  SpO2: 94% 97%   Physical Exam: Cardiac:  HX Atrial fibrillation rate in the 90's Lungs:  Clear on auscultation throughout.  Incisions:  Left stump incision dressed with wound vac. Working well Extremities:  Bilateral lower extremity AKA's. Left with infection and now wound vac  Abdomen:  Positive bowel sounds. Right lower quadrant hernia.  Neurologic: AAOX3 follows commands.  CBC    Component Value Date/Time   WBC 5.2 04/11/2023 0515   RBC 3.05 (L) 04/11/2023 0515   HGB 8.2 (L) 04/11/2023 0515   HGB 13.4 02/12/2015 1137   HCT 27.4 (L) 04/11/2023 0515   HCT 40.9 02/12/2015 1137   PLT 307 04/11/2023 0515   PLT 272 02/12/2015 1137   MCV 89.8 04/11/2023 0515   MCV 90 02/12/2015 1137   MCH 26.9 04/11/2023 0515   MCHC 29.9 (L) 04/11/2023 0515   RDW 17.3 (H) 04/11/2023 0515   RDW 14.8 (H) 02/12/2015 1137    LYMPHSABS 1.6 04/11/2023 0515   LYMPHSABS 1.7 08/27/2014 1145   MONOABS 0.4 04/11/2023 0515   MONOABS 0.4 08/27/2014 1145   EOSABS 0.4 04/11/2023 0515   EOSABS 0.0 08/27/2014 1145   BASOSABS 0.0 04/11/2023 0515   BASOSABS 0.0 08/27/2014 1145    BMET    Component Value Date/Time   NA 137 04/11/2023 0515   NA 140 02/12/2015 1137   K 4.8 04/11/2023 0515   K 2.9 (L) 02/12/2015 1137   CL 103 04/11/2023 0515   CL 107 02/12/2015 1137   CO2 26 04/11/2023 0515   CO2 27 02/12/2015 1137   GLUCOSE 100 (H) 04/11/2023 0515   GLUCOSE 120 (H) 02/12/2015 1137   BUN 20 04/11/2023 0515   BUN 15 02/12/2015 1137   CREATININE 0.54 04/11/2023 0515   CREATININE 0.78 02/12/2015 1137   CALCIUM 8.5 (L) 04/11/2023 0515   CALCIUM 9.0 02/12/2015 1137   GFRNONAA >60 04/11/2023 0515   GFRNONAA >60 02/12/2015 1137   GFRAA >60 11/23/2019 1330   GFRAA >60 02/12/2015 1137    INR    Component Value Date/Time   INR 1.2 04/09/2023 0838   INR 0.8 07/23/2012 0433     Intake/Output Summary (Last 24 hours) at 04/11/2023 1524 Last data filed at 04/11/2023 1044 Gross per 24 hour  Intake 1540 ml  Output 1900 ml  Net -360   ml     Assessment/Plan:  62 y.o. female is s/p Left Lower extremity AKA wound washout with Vac re-application  and Kerecis biologic placed  2 Days Post-Op   PLAN: Patient will be taken back to the operating room for first wound VAC change on Monday April 16, 2023.  Will most likely entail wound washout with Kerecis biologic placed.  Patient is aware and agrees to the timeline.    DVT prophylaxis:  Eliquis 5 mg twice daily   Tyra Gural R Shenise [facility] 04/11/2023 3:24 PM   

## 2023-04-11 NOTE — TOC Progression Note (Addendum)
Transition of Care [facility]) - Progression Note    Patient Details  Name: [name] MRN: [id number] Date of Birth: 02-01-1961  Transition of Care [facility]) CM/SW Contact  Allena Katz, LCSW Phone Number: 04/11/2023, 1:03 PM  Clinical Narrative:   CSW still unable to secure [facility] agency. [facility], [facility], [facility] and  [facility] all unable to take.     Expected Discharge Plan: Home w Home Health Services Barriers to Discharge: Continued Medical Work up  Expected Discharge Plan and Services     Post Acute Care Choice: Resumption of Svcs/PTA Provider Living arrangements for the past 2 months: Single Family Home                           HH Arranged: RN, PT, OT [facility] Agency: [facility] Date HH Agency Contacted: 03/22/23   Representative spoke with at [facility] Agency: Feliberto Gottron   Social Determinants of Health (SDOH) Interventions SDOH Screenings   Food Insecurity: No Food Insecurity (03/22/2023)  Housing: Low Risk  (03/22/2023)  Transportation Needs: No Transportation Needs (03/22/2023)  Utilities: Not At Risk (03/22/2023)  Depression (PHQ2-9): Low Risk  (01/25/2023)  Tobacco Use: Medium Risk (04/11/2023)    Readmission Risk Interventions    03/23/2023   12:17 PM 12/06/2022    1:31 PM 07/25/2022   10:49 AM  Readmission Risk Prevention Plan  Transportation Screening Complete Complete Complete  Medication Review Oceanographer) Complete Complete Complete  PCP or Specialist appointment within 3-5 days of discharge Complete  Complete  HRI or Home Care Consult Complete  Complete  SW Recovery Care/Counseling Consult Complete Complete Not Complete  SW Consult Not Complete Comments   NA  Palliative Care Screening Not Applicable Not Applicable   Skilled Nursing Facility Not Applicable  Patient Refused

## 2023-04-12 MED ORDER — METOPROLOL TARTRATE 25 MG PO TABS
12.5000 mg | ORAL_TABLET | Freq: Two times a day (BID) | ORAL | Status: DC
  Administered 2023-04-13 – 2023-04-17 (×8): 12.5 mg via ORAL
  Filled 2023-04-12 (×9): qty 1

## 2023-04-12 NOTE — Plan of Care (Signed)

## 2023-04-12 NOTE — Progress Notes (Addendum)
       CROSS COVER NOTE  NAME: Carla [name] MRN: [id number] DOB : [birth year]    Concern as stated by nurse / staff   Patient is admitted with infection and necrosis to L AKA site infection, she had a debridement and 3 wound vac changes. She has a history of Chronic diastolic heart failure,depression, anxiety, hyperlipidemia, hypertension, tobacco use, paroxysmal atrial fibrillation on Eliquis, obesity, atherosclerosis, CAD, PAD, history of left stump infection post amputation. Patient's BP was 92/47 MAP 61 I rechecked manual BP it was 100/58 MAP 72. I am notifying you per the order to notify if DBP <60. Patient is asymptomatic.      Pertinent findings on chart review: Per review of last progress note from Dr. Renae Gloss: Hypotension Likely due to pain meds and infection.  Midodrine tapered off.  On lower dose metoprolol.    04/12/2023    8:15 PM 04/12/2023    8:00 PM 04/12/2023    4:59 PM  Vitals with BMI  Systolic 100 92 93  Diastolic 58 47 54  Pulse  79 82     Assessment and  Interventions   Assessment: Known history of hypotension, previously on midodrine 3 times daily with meals 5 mg from 6/1 to 6/5 (per review of med history)  Plan: Continue current meds to keep MAP over 65 May consider restarting midodrine if MAP consistently below 65 with BP rechecks Will hold tonight's dose of metoprolol

## 2023-04-12 NOTE — Progress Notes (Signed)
Progress Note   Patient: Carla [name] HYQ:[id number] DOB: [birth year] DOA: 03/21/2023     22 DOS: the patient was seen and examined on 04/12/2023   Brief hospital course: Ms. Mckensi Mariner is a 62 year old female with history of depression, anxiety, hyperlipidemia, hypertension, tobacco use, paroxysmal atrial fibrillation on Eliquis, obesity, atherosclerosis, CAD, PAD, history of left stump infection post amputation, who presented on 03/21/2023 from vascular outpatient for due to necrosis of the left AKA stump.  Pt was admitted and started on IV antibiotics. Vascular surgery was consulted and took patient to the OR on 5/30 for debridement and wound vac placement.   6/5 status postdebridement by vascular surgery in the operating room. 6/10.  Irrigation debridement of skin and soft tissue done by vascular surgery.  6/19.  Vascular surgery planning to take back to the operating room on 6/24 to change wound VAC.  Assessment and Plan: * Amputation stump infection (HCC) 5/30 - taken to OR for debridement and wound vac placement 6/1 - wound cultures growing ESBL E coli 6/5 -went to the OR to change wound VAC and debridement 6/5.  Wound culture growing MRSA and Proteus 6/10.  Irrigation debridement of left AKA stump. -Patient currently on ertapenem and daptomycin.  Plan will be ertapenem and daptomycin upon discharge.  Antibiotics will go through 7/12. -Pain control.  Increase oxycodone to 15 mg every 6 hours as needed.  Hypokalemia Replaced  Hypotension Likely due to pain meds and infection.  Midodrine tapered off.  On lower dose metoprolol.  Chronic anticoagulation Continue Eliquis  Acute blood loss anemia Last hemoglobin 8.2.  Last ferritin 14.  PVD (peripheral vascular disease) (HCC) Continue Eliquis  Paroxysmal atrial fibrillation (HCC) Continue Eliquis and metoprolol  COPD (chronic obstructive pulmonary disease) (HCC) Respiratory status stable  Recurrent major depressive  disorder (HCC) Appears no longer on Celexa  Iron deficiency anemia Continue iron supplement.  Left thigh pain Due to AKA stump infection, with underlying chronic pain syndrome --Pain control per orders  Pressure injury of skin Present on admission.  See full description below.     Anxiety and depression Appears no longer on Celexa  Adjustment disorder with anxiety Appears no longer taking Celexa  Chronic pain syndrome Continue home Lyrica. Patient on high-dose OxyContin.  Increase oxycodone to 15 mg every 6 hours  GERD (gastroesophageal reflux disease) Continue PPI  Essential hypertension Continue home Imdur, metoprolol         Subjective: Patient states she still in pain but better than yesterday.  Admitted with left AKA stump infection.  Physical Exam: Vitals:   04/12/23 0602 04/12/23 0605 04/12/23 0619 04/12/23 0804  BP: (!) 89/49 110/62 112/70 (!) 108/54  Pulse: 74   75  Resp: 18   19  Temp: 98.4 F (36.9 C)   98.8 F (37.1 C)  TempSrc:      SpO2: 95%   96%  Weight:      Height:       Physical Exam HENT:     Head: Normocephalic.     Mouth/Throat:     Pharynx: No oropharyngeal exudate.  Eyes:     General: Lids are normal.     Conjunctiva/sclera: Conjunctivae normal.  Cardiovascular:     Rate and Rhythm: Normal rate and regular rhythm.     Heart sounds: Normal heart sounds, S1 normal and S2 normal.  Pulmonary:     Breath sounds: No decreased breath sounds or wheezing.  Abdominal:     Palpations: Abdomen is soft.  Tenderness: There is no abdominal tenderness.  Musculoskeletal:     Right Lower Extremity: Right leg is amputated above knee.     Left Lower Extremity: Left leg is amputated above knee.  Skin:    General: Skin is warm.     Comments: Wound VAC covering left leg.  Neurological:     Mental Status: She is alert and oriented to person, place, and time.     Data Reviewed: Last hemoglobin 8.2  Family Communication: Updated  family yesterday  Disposition: Status is: Inpatient Remains inpatient appropriate because: Will have wound VAC change in OR on Monday  Planned Discharge Destination: Home with Home Health    Time spent: 28 minutes  Author: Alford Highland, MD 04/12/2023 2:51 PM  For on call review [email or web address].

## 2023-04-12 NOTE — Evaluation (Signed)
Occupational Therapy Evaluation Patient Details Name: Carla [name] MRN: [id number] DOB: 19-Feb-1961 Today's Date: 04/12/2023   History of Present Illness Carla [name] is a 62 year old female who presented to the vein and vascular clinic with an infected left stump status post left above-the-knee amputation 02/21/2023.  Patient returns to clinic on 03/21/23 for evaluation of the left stump.  She is found to have an infection with drainage.  Patient was sent to the emergency room for further workup.  Patient was then taken to the OR on 03/22/2023 for debridement and wound VAC placement.Patient had her fifth wound vac change with Kerecis placed on 04/09/2023   Clinical Impression   Upon entering the room, pt supine in bed. She endorses 10/10 pain in L residual limb. She reports living at home with daughter who can be available 24/7 at discharge. She states self care tasks performed for bed level by daughter as well as toileting in disposable brief that daughter changes during the day. Pt has hoyer lift but reports not using it much at home. Pt refusing all bed mobility and becoming irritated by therapist asking home set up questions. She reports she does not want to be seen by therapy while in the hospital and pt is likely close to new baseline at this time. OT to complete orders at this time per pt request.      Recommendations for follow up therapy are one component of a multi-disciplinary discharge planning process, led by the attending physician.  Recommendations may be updated based on patient status, additional functional criteria and insurance authorization.   Assistance Recommended at Discharge Intermittent Supervision/Assistance  Patient can return home with the following A lot of help with walking and/or transfers;A lot of help with bathing/dressing/bathroom;Assistance with cooking/housework;Assist for transportation;Help with stairs or ramp for entrance       Equipment Recommendations  None  recommended by OT       Precautions / Restrictions Precautions Precautions: Fall      Mobility Bed Mobility               General bed mobility comments: pt refusal    Transfers                   General transfer comment: pt refusal          ADL either performed or assessed with clinical judgement   ADL Overall ADL's : At baseline                                       General ADL Comments: max- total A from bed level for self care needs     Vision Baseline Vision/History: 1 Wears glasses Patient Visual Report: No change from baseline              Pertinent Vitals/Pain Pain Assessment Pain Assessment: 0-10 Pain Score: 10-Worst pain ever Pain Location: L LE Pain Descriptors / Indicators: Discomfort, Aching Pain Intervention(s): Limited activity within patient's tolerance, Monitored during session     Hand Dominance Right   Extremity/Trunk Assessment Upper Extremity Assessment Upper Extremity Assessment: Generalized weakness   Lower Extremity Assessment Lower Extremity Assessment: Generalized weakness       Communication Communication Communication: No difficulties   Cognition Arousal/Alertness: Awake/alert Behavior During Therapy: WFL for tasks assessed/performed Overall Cognitive Status: Within Functional Limits for tasks assessed  Home Living Family/patient expects to be discharged to:: Private residence Living Arrangements: Children Available Help at Discharge: Family;Available 24 hours/day Type of Home: Mobile home Home Access: Level entry     Home Layout: One level     Bathroom Shower/Tub: Chief Strategy Officer: Standard     Home Equipment: Wheelchair - manual;Shower seat;Hospital bed;Other (comment) (hoyer lift and sling)          Prior Functioning/Environment Prior Level of Function : Needs assist              Mobility Comments: Pt reports being mostly bed bound with occasional use of hoyer lift. ADLs Comments: Pt reports daughter assists with all IADLs and ADLs from bed level. She uses disposable brief for toileting and daughter perfroms hygiene.                 OT Goals(Current goals can be found in the care plan section) Acute Rehab OT Goals Patient Stated Goal: to go home OT Goal Formulation: With patient Time For Goal Achievement: 04/12/23 Potential to Achieve Goals: Fair  OT Frequency:      Co-evaluation PT/OT/SLP Co-Evaluation/Treatment: Yes Reason for Co-Treatment: Complexity of the patient's impairments (multi-system involvement);Necessary to address cognition/behavior during functional activity;For patient/therapist safety;To address functional/ADL transfers PT goals addressed during session: Mobility/safety with mobility OT goals addressed during session: ADL's and self-care      AM-PAC OT "6 Clicks" Daily Activity     Outcome Measure Help from another person eating meals?: None Help from another person taking care of personal grooming?: None Help from another person toileting, which includes using toliet, bedpan, or urinal?: Total Help from another person bathing (including washing, rinsing, drying)?: Total Help from another person to put on and taking off regular upper body clothing?: A Lot Help from another person to put on and taking off regular lower body clothing?: Total 6 Click Score: 13   End of Session Nurse Communication: Mobility status  Activity Tolerance: Other (comment);Patient limited by pain (pt is self limiting) Patient left: in bed;with call bell/phone within reach;with bed alarm set                   Time: 1004-1015 OT Time Calculation (min): 11 min Charges:  OT General Charges $OT Visit: 1 Visit OT Evaluation $OT Eval Moderate Complexity: 1 [address], [place], OTR/L , CBIS ascom [phone]  04/12/23, 12:23 PM

## 2023-04-12 NOTE — Progress Notes (Addendum)
   04/12/23 0602 04/12/23 0605 04/12/23 0619  Vitals  BP (!) 89/49 110/62 112/70  MAP (mmHg) (!) 61  --   --   BP Location Left Arm  --   --   BP Method Automatic Manual Manual    RN rechecked BP manually 2 times this morning prior to administering IV pain medication to this patient.   RN notified Manuela Schwartz, NP of the above information

## 2023-04-12 NOTE — Evaluation (Signed)
Physical Therapy Evaluation Patient Details Name: Carla [name] MRN: [id number] DOB: 03-Mar-1961 Today's Date: 04/12/2023  History of Present Illness  Patient is a 62 year old female who presented on 03/21/2023 from vascular outpatient for due to necrosis of the left AKA stump. S/p multiple debridements and IV antibiotics. History of bilateral AKA   Clinical Impression  Patient seen for PT evaluation. Patient reports she is essentially bed bound at home. She has a hoyer lift for getting up to the wheelchair if needed for doctors appointments. Her daughter assists her with ADLs from bed level and patient reports using briefs for toileting at baseline. She has DME in place at home.  Patient reports she only performs bed mobility when nursing requires her to. She refused to allow therapist to assist with repositioning despite education on importance of routine repositioning for skin integrity. Educated patient on importance of positioning of residual limbs, taking pillows out routinely for stretching hip flexors by positioning the bed flat. She verbalized understanding. Patient reports she is not interested for PT to return while in the hospital. She is likely at her baseline level of function.      Recommendations for follow up therapy are one component of a multi-disciplinary discharge planning process, led by the attending physician.  Recommendations may be updated based on patient status, additional functional criteria and insurance authorization.  Follow Up Recommendations       Assistance Recommended at Discharge Intermittent Supervision/Assistance  Patient can return home with the following  A lot of help with bathing/dressing/bathroom;Help with stairs or ramp for entrance;Assist for transportation;Assistance with cooking/housework;A lot of help with walking and/or transfers    Equipment Recommendations None recommended by PT  Recommendations for Other Services       Functional Status  Assessment Patient has not had a recent decline in their functional status     Precautions / Restrictions Precautions Precautions: Fall Restrictions Weight Bearing Restrictions: No      Mobility  Bed Mobility               General bed mobility comments: patient refused. encouraged patient to actively participate with routine repositioning which she reports she does only when "they make me." educated patient on the risk for pressure wounds from being immobile. also educated patient on the importance for taking the pillows out from under residual limbs and lay the bed flat to stretch hip flexors in preparation for eventual prosthesis as patient reports this is her ultimate goal.    Transfers                   General transfer comment: patient refused to move    Ambulation/Gait                  Stairs            Wheelchair Mobility    Modified Rankin (Stroke Patients Only)       Balance                                             Pertinent Vitals/Pain Pain Assessment Pain Assessment: 0-10 Pain Score: 10-Worst pain ever Pain Location: L LE Pain Descriptors / Indicators: Discomfort, Aching Pain Intervention(s): Limited activity within patient's tolerance, Monitored during session    Home Living Family/patient expects to be discharged to:: Private residence Living Arrangements: Children Available Help  at Discharge: Family;Available 24 hours/day Type of Home: Mobile home Home Access: Level entry       Home Layout: One level Home Equipment: Wheelchair - manual;Shower seat;Hospital bed;Other (comment) (hoyer lift) Additional Comments: takes medical transportation in wheelchair [place] for doctors appointment    Prior Function Prior Level of Function : Needs assist             Mobility Comments: bed bound primarily. gets in the wheelchair if needed to go to the doctor's office ADLs Comments: assistance required from  daughter. uses briefs for toileting     Hand Dominance   Dominant Hand: Right    Extremity/Trunk Assessment   Upper Extremity Assessment Upper Extremity Assessment: Generalized weakness    Lower Extremity Assessment Lower Extremity Assessment: Generalized weakness (AKA bilaterally. patient has pillows under both residual limbs and refuses therapist to touch her legs in any way. active movement is observed in the right residual limb)       Communication   Communication: No difficulties  Cognition Arousal/Alertness: Awake/alert Behavior During Therapy: WFL for tasks assessed/performed Overall Cognitive Status: Within Functional Limits for tasks assessed                                          General Comments General comments (skin integrity, edema, etc.): patient reports she is not interested for therapy to return to her room at this time    Exercises     Assessment/Plan    PT Assessment Patient does not need any further PT services  PT Problem List         PT Treatment Interventions      PT Goals (Current goals can be found in the Care Plan section)  Acute Rehab PT Goals PT Goal Formulation: All assessment and education complete, DC therapy    Frequency       Co-evaluation PT/OT/SLP Co-Evaluation/Treatment: Yes Reason for Co-Treatment: Complexity of the patient's impairments (multi-system involvement);Necessary to address cognition/behavior during functional activity;For patient/therapist safety;To address functional/ADL transfers PT goals addressed during session: Mobility/safety with mobility OT goals addressed during session: ADL's and self-care       AM-PAC PT "6 Clicks" Mobility  Outcome Measure Help needed turning from your back to your side while in a flat bed without using bedrails?: A Lot Help needed moving from lying on your back to sitting on the side of a flat bed without using bedrails?: Total Help needed moving to and from a  bed to a chair (including a wheelchair)?: Total Help needed standing up from a chair using your arms (e.g., wheelchair or bedside chair)?: Total Help needed to walk in hospital room?: Total Help needed climbing 3-5 steps with a railing? : Total 6 Click Score: 7    End of Session   Activity Tolerance:  (limited by willingness to participate with therapy/mobility efforts) Patient left: in bed;with call bell/phone within reach;with bed alarm set        Time: 1003-1015 PT Time Calculation (min) (ACUTE ONLY): 12 min   Charges:   PT Evaluation $PT Eval Low Complexity: 1 Low          Donna Bernard, PT, MPT   Ina Homes 04/12/2023, 12:55 PM

## 2023-04-13 DIAGNOSIS — A4902 Methicillin resistant Staphylococcus aureus infection, unspecified site: Secondary | ICD-10-CM

## 2023-04-13 LAB — CBC
HCT: 28.3 % — ABNORMAL LOW (ref 36.0–46.0)
Hemoglobin: 8.4 g/dL — ABNORMAL LOW (ref 12.0–15.0)
MCH: 26.7 pg (ref 26.0–34.0)
MCHC: 29.7 g/dL — ABNORMAL LOW (ref 30.0–36.0)
MCV: 89.8 fL (ref 80.0–100.0)
Platelets: 296 10*3/uL (ref 150–400)
RBC: 3.15 MIL/uL — ABNORMAL LOW (ref 3.87–5.11)
RDW: 17.3 % — ABNORMAL HIGH (ref 11.5–15.5)
WBC: 5.7 10*3/uL (ref 4.0–10.5)
nRBC: 0 % (ref 0.0–0.2)

## 2023-04-13 LAB — BASIC METABOLIC PANEL
Anion gap: 10 (ref 5–15)
BUN: 18 mg/dL (ref 8–23)
CO2: 25 mmol/L (ref 22–32)
Calcium: 8.7 mg/dL — ABNORMAL LOW (ref 8.9–10.3)
Chloride: 101 mmol/L (ref 98–111)
Creatinine, Ser: 0.61 mg/dL (ref 0.44–1.00)
GFR, Estimated: >60 mL/min (ref >60)
Glucose, Bld: 93 mg/dL (ref 70–99)
Potassium: 4.5 mmol/L (ref 3.5–5.1)
Sodium: 136 mmol/L (ref 135–145)

## 2023-04-13 NOTE — Progress Notes (Signed)
Date of Admission:  03/21/2023      ID: Carla [name] is a 62 y.o. female  Principal Problem:   Amputation stump infection (HCC) Active Problems:   Essential hypertension   GERD (gastroesophageal reflux disease)   Chronic pain syndrome   Recurrent major depressive disorder (HCC)   Hypotension   Adjustment disorder with anxiety   Paroxysmal atrial fibrillation (HCC)   COPD (chronic obstructive pulmonary disease) (HCC)   PVD (peripheral vascular disease) (HCC)   Hypokalemia   Chronic anticoagulation   Anxiety and depression   Acute blood loss anemia   Iron deficiency anemia   Pressure injury of skin   Left thigh pain   MRSA infection   Infection due to ESBL-producing Escherichia coli  debridement and placement of Kerecis sheet synthetic skin graft and wound vac Back to OR on 04/16/23 Subjective: Pt doing better  Medications:   acetaminophen  1,000 mg Oral Q8H   apixaban  5 mg Oral BID   ascorbic acid  500 mg Oral BID   atorvastatin  40 mg Oral QHS   chlorhexidine  60 mL Topical Once   vitamin B-12  1,000 mcg Oral Daily   feeding supplement  237 mL Oral BID BM   ferrous sulfate  325 mg Oral Q breakfast   folic acid  1 mg Oral Daily   isosorbide mononitrate  120 mg Oral Daily   metoprolol tartrate  12.5 mg Oral BID   nystatin cream   Topical BID   oxyCODONE  40 mg Oral Q12H   pantoprazole  40 mg Oral Daily   polyethylene glycol  17 g Oral Daily   pregabalin  200 mg Oral TID   senna-docusate  1 tablet Oral BID   Vitamin D (Ergocalciferol)  50,000 Units Oral Q7 days   zinc sulfate  220 mg Oral Daily    Objective: Vital signs in last 24 hours: Patient Vitals for the past 24 hrs:  BP Temp Temp src Pulse Resp SpO2  04/13/23 0851 (!) 105/59 (!) 97.2 F (36.2 C) -- 80 20 92 %  04/13/23 0527 105/70 98.6 F (37 C) Oral 78 18 93 %  04/13/23 0333 112/78 -- -- -- -- --  04/13/23 0013 110/60 -- -- -- -- --  04/12/23 2015 (!) 100/58 -- -- -- -- --  04/12/23 2000 (!)  92/47 98.4 F (36.9 C) Oral 79 18 95 %  04/12/23 1659 (!) 93/54 98.4 F (36.9 C) -- 82 18 94 %     PHYSICAL EXAM:  General: Alert, cooperative, no distress Lungs: Clear to auscultation bilaterally. No Wheezing or Rhonchi. No rales. Heart:s1s2 Abdomen: Soft, non-tender,not distended. Bowel sounds normal. No masses Extremities: b/l AKA- - Left stump- wound vac  R stump  small wound almost closed Skin: No rashes or lesions. Or bruising Lymph: Cervical, supraclavicular normal. Neurologic: Grossly non-focal  Lab Results    Latest Ref Rng & Units 04/13/2023    6:28 AM 04/11/2023    5:15 AM 04/09/2023    8:38 AM  CBC  WBC 4.0 - 10.5 K/uL 5.7  5.2  7.0   Hemoglobin 12.0 - 15.0 g/dL 8.4  8.2  51.7   Hematocrit 36.0 - 46.0 % 28.3  27.4  33.5   Platelets 150 - 400 K/uL 296  307  308        Latest Ref Rng & Units 04/13/2023    6:28 AM 04/11/2023    5:15 AM 04/09/2023    8:38 AM  CMP  Glucose 70 - 99 mg/dL 93  161  91   BUN 8 - 23 mg/dL 18  20  20    Creatinine 0.44 - 1.00 mg/dL 0.96  0.45  4.09   Sodium 135 - 145 mmol/L 136  137  139   Potassium 3.5 - 5.1 mmol/L 4.5  4.8  4.7   Chloride 98 - 111 mmol/L 101  103  102   CO2 22 - 32 mmol/L 25  26  27    Calcium 8.9 - 10.3 mg/dL 8.7  8.5  8.9       Microbiology: Left AKA stump  surgical wound has ESBL ecoli and proteus in the wound MRSA     Assessment/Plan: 62 y.o. female with a history of A-fib, CHF, PAD, COPD, HLD, hypertension, right AKA, left leg irreversible ischemia April 2024 and was in [facility] 02/15/23-02/24/23 needing left above-the-knee amputation on 02/21/2023 She went for a vascular follow up appt on 03/21/23 and they noted the surgical site was necrotic and had purulent discharge. She was asked to get admitted   Left AKA stump infection- ESBL ecoli and proteus and MRSA  in the  wound Currently on meropenem and daptomycin   She is going to need Iv antibitcs for 4 -6 weeks Will need IV antibiotic ( ertapenem and dapto on  discharge) would need until 05/04/23 Back to OR on 04/16/23   Rt AKA stump= wound Is very small and almost closed Had chronic infection for 3 months ( MRSA, ESBL, enterococcus) and took many courses of antibiotics   Current smoker Severe PAD with stents   CAD Imdur Metoprolol atorvastatin   HTN? ? AFIB controlled- on eliquis   Anemia    Discussed the management with the patient and her daughter RCID on call this weekend- available for urgent issues only by phone

## 2023-04-13 NOTE — Consult Note (Signed)
Pharmacy Antibiotic Note  Carla [name] is a 62 y.o. female admitted on 03/21/2023 with  wound infection . Patient was sent to ED by vascular given concern for possible osteomyelitis. Patient has received multiple IV antibiotics this year including meropenem, daptomycin, and ertapenem in February and March. She has also received other oral agents including omadacycline and metronidazole this year. Historical wound cultures have grown resistant bacteria including MRSA and ESBL E. coli. Pharmacy consulted to dose daptomycin and meropenem for L AKA stump wound infection. Discussed [facility] from 5/30 culture with micro lab and they were unable to isolate that organisms. 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   S/P wound vac exchange on 6/5- repeat cultures sent from OR. Noted patient is on atorvastatin 80 mg for PVD. Reduced to atorvastatin 40mg .   -  baseline CK 6/5 = 14 - 6/5 wound culture with S aureus isolated, susceptibilities pending. Also proteus mirabilis which was seen in previous culture  Most recent Wound vac exchange and I&D was on 6/17, next trip to OR planned for 6/24 6/19 CK = 14  Plan: Day # 117 daptomycin/Day# 21 on meropenem/ertapenem Continue ertapenem 1 g IV q24H Continue daptomycin 500 mg IV Q24H (~8 mg/kg AdjBW for BMI>30) Weekly CK on Wed Monitor renal function CK weekly, F/U discharge plans  OPAT completed for daptomycin and ertapenem (will need IV abx until 05/04/23)  Height: 5\' 2"  (157.5 cm) Weight: 79.4 kg (175 lb) IBW/kg (Calculated) : 50.1  Temp (24hrs), Avg:98.2 F (36.8 C), Min:97.2 F (36.2 C), Max:98.6 F (37 C)  Recent Labs  Lab 04/07/23 0927 04/09/23 0838 04/11/23 0515 04/13/23 0628  WBC 5.2 7.0 5.2 5.7  CREATININE 0.57 0.50 0.54 0.61     Estimated Creatinine Clearance: 70.2 mL/min (by C-G formula based on SCr of 0.61 mg/dL).    Allergies  Allergen Reactions   Gabapentin     Tremors    Bactrim  [Sulfamethoxazole-Trimethoprim] Itching    Antimicrobials this admission: 5/29 Zosyn x 1  5/29 Vancomycin >> 6/1 5/29 Cefepime >> 6/1 6/01 Meropenem >>c 6/05 daptomycin >>c  Dose adjustments this admission: N/A  Microbiology results: 5/29 BCx: NGTD 5/30 OR cultures: ESBL E. coli (S-imipenem, S-gentamicin), P mirabilis 6/5 tissue cx (during VAC change): MRSA, P mirabilis  Thank you for allowing pharmacy to be a part of this patient's care.  Juliette Alcide, PharmD, BCPS, BCIDP Work Cell: [phone] 04/13/2023 12:20 PM

## 2023-04-13 NOTE — Plan of Care (Signed)

## 2023-04-13 NOTE — Progress Notes (Signed)
Progress Note   Patient: Carla [name] ZOX:[id number] DOB: 01/17/1961 DOA: 03/21/2023     23 DOS: the patient was seen and examined on 04/13/2023   Brief hospital course: Ms. Dalal Livengood is a 62 year old female with history of depression, anxiety, hyperlipidemia, hypertension, tobacco use, paroxysmal atrial fibrillation on Eliquis, obesity, atherosclerosis, CAD, PAD, history of left stump infection post amputation, who presented on 03/21/2023 from vascular outpatient for due to necrosis of the left AKA stump.  Pt was admitted and started on IV antibiotics. Vascular surgery was consulted and took patient to the OR on 5/30 for debridement and wound vac placement.   6/5 status postdebridement by vascular surgery in the operating room. 6/10.  Irrigation debridement of skin and soft tissue done by vascular surgery.  6/19.  Vascular surgery planning to take back to the operating room on 6/24 to change wound VAC.  Increased oral oxycodone to 15 mg. 6/20.  Pain seems better controlled but still having pain.  Continue same regimen today. 621.  Patient still having a lot of pain but wants to continue the same pain regimen.  Assessment and Plan: * Amputation stump infection (HCC) 5/30 - taken to OR for debridement and wound vac placement 6/1 - wound cultures growing ESBL E coli 6/5 -went to the OR to change wound VAC and debridement 6/5.  Wound culture growing MRSA and Proteus 6/10.  Irrigation debridement of left AKA stump. -Patient currently on ertapenem and daptomycin.  Plan will be ertapenem and daptomycin upon discharge.  Antibiotics will go through 7/12. -Pain control.  Increased oxycodone to 15 mg every 6 hours as needed.  Hypokalemia Replaced  Hypotension Likely due to pain meds and infection.  Midodrine tapered off.  On lower dose metoprolol.  Chronic anticoagulation Continue Eliquis  Acute blood loss anemia Last hemoglobin 8.4.  Last ferritin 14.  PVD (peripheral vascular disease)  (HCC) Continue Eliquis  Paroxysmal atrial fibrillation (HCC) Continue Eliquis and metoprolol  COPD (chronic obstructive pulmonary disease) (HCC) Respiratory status stable  Recurrent major depressive disorder (HCC) Appears no longer on Celexa  Iron deficiency anemia Continue iron supplement.  Left thigh pain Due to AKA stump infection, with underlying chronic pain syndrome --Pain control per orders  Pressure injury of skin Present on admission.  See full description below.     Anxiety and depression Appears no longer on Celexa  Adjustment disorder with anxiety Appears no longer taking Celexa  Chronic pain syndrome Continue home Lyrica. Patient on high-dose OxyContin.  Increased oxycodone to 15 mg every 6 hours on 6/19.  GERD (gastroesophageal reflux disease) Continue PPI  Essential hypertension Continue home Imdur, metoprolol         Subjective patient states that she always has quite a bit of pain chronically.  Having acute pain in her left leg at the wound site.  Pain always graded around 10.  Physical Exam: Vitals:   04/13/23 0013 04/13/23 0333 04/13/23 0527 04/13/23 0851  BP: 110/60 112/78 105/70 (!) 105/59  Pulse:   78 80  Resp:   18 20  Temp:   98.6 F (37 C) (!) 97.2 F (36.2 C)  TempSrc:   Oral   SpO2:   93% 92%  Weight:      Height:       Physical Exam HENT:     Head: Normocephalic.     Mouth/Throat:     Pharynx: No oropharyngeal exudate.  Eyes:     General: Lids are normal.     Conjunctiva/sclera:  Conjunctivae normal.  Cardiovascular:     Rate and Rhythm: Normal rate and regular rhythm.     Heart sounds: Normal heart sounds, S1 normal and S2 normal.  Pulmonary:     Breath sounds: No decreased breath sounds or wheezing.  Abdominal:     Palpations: Abdomen is soft.     Tenderness: There is no abdominal tenderness.  Musculoskeletal:     Right Lower Extremity: Right leg is amputated above knee.     Left Lower Extremity: Left leg is  amputated above knee.  Skin:    General: Skin is warm.     Comments: Wound VAC covering left leg.  Neurological:     Mental Status: She is alert and oriented to person, place, and time.     Data Reviewed: Creatinine 0.61, hemoglobin 8.4  Family Communication: Left message for [name]  Disposition: Status is: Inpatient Remains inpatient appropriate because: Will have wound VAC change on Monday  Planned Discharge Destination: Home with Home Health    Time spent: 28 minutes  Author: Alford Highland, MD 04/13/2023 2:00 PM  For on call review [email or web address].

## 2023-04-14 DIAGNOSIS — F419 Anxiety disorder, unspecified: Secondary | ICD-10-CM

## 2023-04-14 DIAGNOSIS — F32A Depression, unspecified: Secondary | ICD-10-CM

## 2023-04-14 NOTE — Progress Notes (Signed)
Progress Note   Patient: Carla [name] JXB:[id number] DOB: 1961-09-11 DOA: 03/21/2023     24 DOS: the patient was seen and examined on 04/14/2023   Brief hospital course: Ms. Luna Audia is a 62 year old female with history of depression, anxiety, hyperlipidemia, hypertension, tobacco use, paroxysmal atrial fibrillation on Eliquis, obesity, atherosclerosis, CAD, PAD, history of left stump infection post amputation, who presented on 03/21/2023 from vascular outpatient for due to necrosis of the left AKA stump.  Pt was admitted and started on IV antibiotics. Vascular surgery was consulted and took patient to the OR on 5/30 for debridement and wound vac placement.   6/5 status postdebridement by vascular surgery in the operating room. 6/10.  Irrigation debridement of skin and soft tissue done by vascular surgery.  6/19.  Vascular surgery planning to take back to the operating room on 6/24 to change wound VAC.  Increased oral oxycodone to 15 mg. 6/20.  Pain seems better controlled but still having pain.  Continue same regimen today. 6/21&22.  Patient still having a lot of pain but wants to continue the same pain regimen.  Assessment and Plan: * Amputation stump infection (HCC) 5/30 - taken to OR for debridement and wound vac placement 6/1 - wound cultures growing ESBL E coli 6/5 -went to the OR to change wound VAC and debridement 6/5.  Wound culture growing MRSA and Proteus 6/10.  Irrigation debridement of left AKA stump. -Patient currently on ertapenem and daptomycin.  Plan will be ertapenem and daptomycin upon discharge.  Antibiotics will go through 7/12. -Pain control.  Continue oxycodone to 15 mg every 6 hours as needed. -Wound VAC change on 6/24  Hypokalemia Replaced  Hypotension Likely due to pain meds and infection.  Midodrine tapered off.  On lower dose metoprolol.  Chronic anticoagulation Continue Eliquis  Acute blood loss anemia Last hemoglobin 8.4.  Last ferritin 14.   Patient on oral iron.  PVD (peripheral vascular disease) (HCC) Continue Eliquis  Paroxysmal atrial fibrillation (HCC) Continue Eliquis and metoprolol  COPD (chronic obstructive pulmonary disease) (HCC) Respiratory status stable  Recurrent major depressive disorder (HCC) Appears no longer on Celexa  Iron deficiency anemia Continue iron supplement.  Left thigh pain Due to AKA stump infection, with underlying chronic pain syndrome --Pain control per orders  Pressure injury of skin Present on admission.  See full description below.     Anxiety and depression Appears no longer on Celexa  Adjustment disorder with anxiety Appears no longer taking Celexa  Chronic pain syndrome Continue home Lyrica. Patient on high-dose OxyContin.  Increased oxycodone to 15 mg every 6 hours on 6/19.  GERD (gastroesophageal reflux disease) Continue PPI  Essential hypertension Continue home Imdur, metoprolol         Subjective: Patient awakened from sleep.  Still having quite a bit of pain in her left leg.  For wound VAC change on Monday.  Admitted with left lower extremity stump infection.  Physical Exam: Vitals:   04/13/23 1616 04/13/23 2043 04/14/23 0341 04/14/23 0818  BP: 95/62 114/63 109/67 (!) 99/52  Pulse: 93 75 78 76  Resp: 16 18 20 18   Temp: 98.3 F (36.8 C) 97.9 F (36.6 C) 98.3 F (36.8 C) 98.6 F (37 C)  TempSrc: Oral     SpO2: 93% 96% 93% 92%  Weight:      Height:       Physical Exam HENT:     Head: Normocephalic.     Mouth/Throat:     Pharynx: No oropharyngeal  exudate.  Eyes:     General: Lids are normal.     Conjunctiva/sclera: Conjunctivae normal.  Cardiovascular:     Rate and Rhythm: Normal rate and regular rhythm.     Heart sounds: Normal heart sounds, S1 normal and S2 normal.  Pulmonary:     Breath sounds: No decreased breath sounds or wheezing.  Abdominal:     Palpations: Abdomen is soft.     Tenderness: There is no abdominal tenderness.   Musculoskeletal:     Right Lower Extremity: Right leg is amputated above knee.     Left Lower Extremity: Left leg is amputated above knee.  Skin:    General: Skin is warm.     Comments: Wound VAC covering left leg.  Neurological:     Mental Status: She is alert and oriented to person, place, and time.     Data Reviewed: No new data today  Family Communication: updated patient daughter on the phone  Disposition: Status is: Inpatient Remains inpatient appropriate because: TOC having difficulty obtaining a home health agency. Planned Discharge Destination: Home with Home Health    Time spent: 27 minutes  Author: Alford Highland, MD 04/14/2023 12:35 PM  For on call review [email or web address].

## 2023-04-15 NOTE — Progress Notes (Signed)
Progress Note   Patient: Carla [name] ZOX:[id number] DOB: 06-18-1961 DOA: 03/21/2023     25 DOS: the patient was seen and examined on 04/15/2023   Brief hospital course: Ms. Trinitee Horgan is a 62 year old female with history of depression, anxiety, hyperlipidemia, hypertension, tobacco use, paroxysmal atrial fibrillation on Eliquis, obesity, atherosclerosis, CAD, PAD, history of left stump infection post amputation, who presented on 03/21/2023 from vascular outpatient for due to necrosis of the left AKA stump.  Pt was admitted and started on IV antibiotics. Vascular surgery was consulted and took patient to the OR on 5/30 for debridement and wound vac placement.   6/5 status postdebridement by vascular surgery in the operating room. 6/10.  Irrigation debridement of skin and soft tissue done by vascular surgery.  6/19.  Vascular surgery planning to take back to the operating room on 6/24 to change wound VAC.  Increased oral oxycodone to 15 mg. 6/20.  Pain seems better controlled but still having pain.  Continue same regimen today. 6/21&22.  Patient still having a lot of pain but wants to continue the same pain regimen. 6/23.  Patient less talkative today.  Still in pain.  Assessment and Plan: * Amputation stump infection (HCC) 5/30 - taken to OR for debridement and wound vac placement 6/1 - wound cultures growing ESBL E coli 6/5 -went to the OR to change wound VAC and debridement 6/5.  Wound culture growing MRSA and Proteus 6/10.  Irrigation debridement of left AKA stump. -Patient currently on ertapenem and daptomycin.  Plan will be ertapenem and daptomycin upon discharge.  Antibiotics will go through 7/12. -Pain control.  Continue oxycodone to 15 mg every 6 hours as needed. -Wound VAC change on 6/24  Hypokalemia Replaced  Hypotension Likely due to pain meds and infection.  Midodrine tapered off.  On lower dose metoprolol.  Chronic anticoagulation Continue Eliquis  Acute blood loss  anemia Last hemoglobin 8.4.  Last ferritin 14.  Patient on oral iron.  PVD (peripheral vascular disease) (HCC) Continue Eliquis  Paroxysmal atrial fibrillation (HCC) Continue Eliquis and metoprolol  COPD (chronic obstructive pulmonary disease) (HCC) Respiratory status stable  Recurrent major depressive disorder (HCC) Appears no longer on Celexa  Iron deficiency anemia Continue iron supplement.  Left thigh pain Due to AKA stump infection, with underlying chronic pain syndrome --Pain control per orders  Pressure injury of skin Present on admission.  See full description below.     Anxiety and depression Appears no longer on Celexa  Adjustment disorder with anxiety Appears no longer taking Celexa  Chronic pain syndrome Continue home Lyrica. Patient on high-dose OxyContin.  Increased oxycodone to 15 mg every 6 hours on 6/19.  GERD (gastroesophageal reflux disease) Continue PPI  Essential hypertension Continue home Imdur, metoprolol         Subjective: Patient awakened from sleep.  Less talkative today.  States she does have pain.  Will have wound VAC changed tomorrow.  Admitted with stump infection.  Physical Exam: Vitals:   04/14/23 1702 04/14/23 2034 04/15/23 0644 04/15/23 0900  BP: (!) 90/54 (!) 90/44 (!) 103/58 (!) 97/55  Pulse: 79 85 77 76  Resp: 16 16 18 16   Temp: 99.5 F (37.5 C) 98.5 F (36.9 C) 97.7 F (36.5 C) 98.5 F (36.9 C)  TempSrc: Oral Oral Oral Oral  SpO2: 95% 93% 96% 93%  Weight:      Height:       Physical Exam HENT:     Head: Normocephalic.  Mouth/Throat:     Pharynx: No oropharyngeal exudate.  Eyes:     General: Lids are normal.     Conjunctiva/sclera: Conjunctivae normal.  Cardiovascular:     Rate and Rhythm: Normal rate and regular rhythm.     Heart sounds: Normal heart sounds, S1 normal and S2 normal.  Pulmonary:     Breath sounds: No decreased breath sounds or wheezing.  Abdominal:     Palpations: Abdomen is  soft.     Tenderness: There is no abdominal tenderness.  Musculoskeletal:     Right Lower Extremity: Right leg is amputated above knee.     Left Lower Extremity: Left leg is amputated above knee.  Skin:    General: Skin is warm.     Comments: Wound VAC covering left leg.  Neurological:     Mental Status: She is alert and oriented to person, place, and time.     Data Reviewed: No new data today  Family Communication: Updated daughter yesterday  Disposition: Status is: Inpatient Remains inpatient appropriate because: Will have wound VAC changed tomorrow  Planned Discharge Destination: Home    Time spent: 26 minutes  Author: Alford Highland, MD 04/15/2023 3:48 PM  For on call review [email or web address].

## 2023-04-16 ENCOUNTER — Encounter
Admission: EM | Disposition: A | Discharge status: Home / Self Care | Payer: Self-pay | Source: Home / Self Care | Attending: Internal Medicine

## 2023-04-16 ENCOUNTER — Other Ambulatory Visit (HOSPITAL_COMMUNITY): Payer: Self-pay

## 2023-04-16 ENCOUNTER — Inpatient Hospital Stay: Payer: 59 | Admitting: Anesthesiology

## 2023-04-16 ENCOUNTER — Other Ambulatory Visit: Payer: Self-pay

## 2023-04-16 DIAGNOSIS — T85693A Other mechanical complication of artificial skin graft and decellularized allodermis, initial encounter: Secondary | ICD-10-CM

## 2023-04-16 HISTORY — PX: APPLICATION OF WOUND VAC: SHX5189

## 2023-04-16 LAB — CBC
HCT: 29.2 % — ABNORMAL LOW (ref 36.0–46.0)
Hemoglobin: 8.7 g/dL — ABNORMAL LOW (ref 12.0–15.0)
MCH: 26.9 pg (ref 26.0–34.0)
MCHC: 29.8 g/dL — ABNORMAL LOW (ref 30.0–36.0)
MCV: 90.1 fL (ref 80.0–100.0)
Platelets: 283 10*3/uL (ref 150–400)
RBC: 3.24 MIL/uL — ABNORMAL LOW (ref 3.87–5.11)
RDW: 17.2 % — ABNORMAL HIGH (ref 11.5–15.5)
WBC: 5.5 10*3/uL (ref 4.0–10.5)
nRBC: 0 % (ref 0.0–0.2)

## 2023-04-16 LAB — BASIC METABOLIC PANEL
Anion gap: 6 (ref 5–15)
BUN: 12 mg/dL (ref 8–23)
CO2: 27 mmol/L (ref 22–32)
Calcium: 8.6 mg/dL — ABNORMAL LOW (ref 8.9–10.3)
Chloride: 105 mmol/L (ref 98–111)
Creatinine, Ser: 0.46 mg/dL (ref 0.44–1.00)
GFR, Estimated: >60 mL/min (ref >60)
Glucose, Bld: 94 mg/dL (ref 70–99)
Potassium: 4.2 mmol/L (ref 3.5–5.1)
Sodium: 138 mmol/L (ref 135–145)

## 2023-04-16 SURGERY — APPLICATION, WOUND VAC
Anesthesia: General | Laterality: Left

## 2023-04-16 MED ORDER — AMOXICILLIN-POT CLAVULANATE 875-125 MG PO TABS
1.0000 | ORAL_TABLET | Freq: Two times a day (BID) | ORAL | 0 refills | Status: DC
  Filled 2023-04-16: qty 36, 18d supply, fill #0

## 2023-04-16 MED ORDER — MIDODRINE HCL 5 MG PO TABS
5.0000 mg | ORAL_TABLET | Freq: Three times a day (TID) | ORAL | Status: DC
  Administered 2023-04-16: 5 mg via ORAL
  Filled 2023-04-16: qty 1

## 2023-04-16 MED ORDER — KETAMINE HCL 50 MG/5ML IJ SOSY
PREFILLED_SYRINGE | INTRAMUSCULAR | Status: AC
  Filled 2023-04-16: qty 5

## 2023-04-16 MED ORDER — SODIUM CHLORIDE 0.9 % IV SOLN: INTRAVENOUS | Status: DC

## 2023-04-16 MED ORDER — FENTANYL CITRATE (PF) 100 MCG/2ML IJ SOLN
INTRAMUSCULAR | Status: DC | PRN
  Administered 2023-04-16 (×2): 25 ug via INTRAVENOUS

## 2023-04-16 MED ORDER — CHLORHEXIDINE GLUCONATE 4 % EX SOLN: 60.0000 mL | Freq: Once | CUTANEOUS | Status: DC

## 2023-04-16 MED ORDER — KETAMINE HCL 10 MG/ML IJ SOLN
INTRAMUSCULAR | Status: DC | PRN
  Administered 2023-04-16: 30 mg via INTRAVENOUS

## 2023-04-16 MED ORDER — FENTANYL CITRATE (PF) 100 MCG/2ML IJ SOLN
INTRAMUSCULAR | Status: AC
  Filled 2023-04-16: qty 2

## 2023-04-16 MED ORDER — ONDANSETRON HCL 4 MG/2ML IJ SOLN: 4.0000 mg | Freq: Once | INTRAMUSCULAR | Status: DC | PRN

## 2023-04-16 MED ORDER — PROPOFOL 1000 MG/100ML IV EMUL
INTRAVENOUS | Status: AC
  Filled 2023-04-16: qty 100

## 2023-04-16 MED ORDER — FENTANYL CITRATE (PF) 100 MCG/2ML IJ SOLN
25.0000 ug | INTRAMUSCULAR | Status: AC | PRN
  Administered 2023-04-16 (×6): 25 ug via INTRAVENOUS

## 2023-04-16 MED ORDER — OMADACYCLINE TOSYLATE 150 MG PO TABS
300.0000 mg | ORAL_TABLET | Freq: Every day | ORAL | 0 refills | Status: DC
  Filled 2023-04-16: qty 30, 10d supply, fill #0
  Filled 2023-04-16: qty 38, 19d supply, fill #0

## 2023-04-16 MED ORDER — 0.9 % SODIUM CHLORIDE (POUR BTL) OPTIME
TOPICAL | Status: DC | PRN
  Administered 2023-04-16: 500 mL

## 2023-04-16 MED ORDER — ONDANSETRON HCL 4 MG/2ML IJ SOLN
INTRAMUSCULAR | Status: DC | PRN
  Administered 2023-04-16: 4 mg via INTRAVENOUS

## 2023-04-16 MED ORDER — MIDAZOLAM HCL 2 MG/2ML IJ SOLN
INTRAMUSCULAR | Status: AC
  Filled 2023-04-16: qty 2

## 2023-04-16 MED ORDER — PROPOFOL 500 MG/50ML IV EMUL
INTRAVENOUS | Status: DC | PRN
  Administered 2023-04-16: 40 mg via INTRAVENOUS
  Administered 2023-04-16: 125 ug/kg/min via INTRAVENOUS

## 2023-04-16 MED ORDER — ONDANSETRON HCL 4 MG/2ML IJ SOLN
INTRAMUSCULAR | Status: AC
  Filled 2023-04-16: qty 2

## 2023-04-16 MED ORDER — MIDAZOLAM HCL 2 MG/2ML IJ SOLN
INTRAMUSCULAR | Status: DC | PRN
  Administered 2023-04-16: 2 mg via INTRAVENOUS

## 2023-04-16 SURGICAL SUPPLY — 28 items
CANISTER WOUND CARE 500ML ATS (WOUND CARE) ×1 IMPLANT
DRAPE INCISE IOBAN 66X45 STRL (DRAPES) ×1 IMPLANT
DRAPE LAPAROTOMY 100X77 ABD (DRAPES) ×1 IMPLANT
DRSG VAC GRANUFOAM LG (GAUZE/BANDAGES/DRESSINGS) ×1 IMPLANT
DRSG VAC GRANUFOAM MED (GAUZE/BANDAGES/DRESSINGS) ×1 IMPLANT
ELECT REM PT RETURN 9FT ADLT (ELECTROSURGICAL)
ELECTRODE REM PT RTRN 9FT ADLT (ELECTROSURGICAL) ×1 IMPLANT
GAUZE 4X4 16PLY ~~LOC~~+RFID DBL (SPONGE) ×1 IMPLANT
GAUZE SPONGE 4X4 12PLY STRL (GAUZE/BANDAGES/DRESSINGS) IMPLANT
GLOVE BIO SURGEON STRL SZ7 (GLOVE) ×2 IMPLANT
GOWN STRL REUS W/ TWL LRG LVL3 (GOWN DISPOSABLE) ×2 IMPLANT
GOWN STRL REUS W/ TWL XL LVL3 (GOWN DISPOSABLE) ×1 IMPLANT
GOWN STRL REUS W/TWL LRG LVL3 (GOWN DISPOSABLE) ×2
GOWN STRL REUS W/TWL XL LVL3 (GOWN DISPOSABLE) ×1
GRAFT SKIN WND MESHED 7X20 (Tissue) IMPLANT
GRAFT SKIN WND MICRO 38 (Tissue) IMPLANT
KIT TURNOVER KIT A (KITS) ×1 IMPLANT
MANIFOLD NEPTUNE II (INSTRUMENTS) ×1 IMPLANT
NS IRRIG 1000ML POUR BTL (IV SOLUTION) ×1 IMPLANT
NS IRRIG 500ML POUR BTL (IV SOLUTION) IMPLANT
PACK BASIN MINOR ARMC (MISCELLANEOUS) ×1 IMPLANT
PAD PREP 24X41 OB/GYN DISP (PERSONAL CARE ITEMS) IMPLANT
SOL PREP PVP 2OZ (MISCELLANEOUS) ×2
SOLUTION PREP PVP 2OZ (MISCELLANEOUS) ×1 IMPLANT
SPONGE T-LAP 18X18 ~~LOC~~+RFID (SPONGE) ×1 IMPLANT
STAPLER SKIN PROX 35W (STAPLE) IMPLANT
TRAP FLUID SMOKE EVACUATOR (MISCELLANEOUS) ×1 IMPLANT
WATER STERILE IRR 500ML POUR (IV SOLUTION) ×1 IMPLANT

## 2023-04-16 NOTE — Progress Notes (Signed)
Progress Note   Patient: Carla [name] MVH:[id number] DOB: 01-Aug-1961 DOA: 03/21/2023     26 DOS: the patient was seen and examined on 04/16/2023   Brief hospital course: [name] is a 62 year old female with history of depression, anxiety, hyperlipidemia, hypertension, tobacco use, paroxysmal atrial fibrillation on Eliquis, obesity, atherosclerosis, CAD, PAD, history of left stump infection post amputation, who presented on 03/21/2023 from vascular outpatient for due to necrosis of the left AKA stump.  Pt was admitted and started on IV antibiotics. Vascular surgery was consulted and took patient to the OR on 5/30 for debridement and wound vac placement.   6/5 status postdebridement by vascular surgery in the operating room. 6/10.  Irrigation debridement of skin and soft tissue done by vascular surgery.  6/19.  Vascular surgery planning to take back to the operating room on 6/24 to change wound VAC.  Increased oral oxycodone to 15 mg. 6/20.  Pain seems better controlled but still having pain.  Continue same regimen today. 6/21&22.  Patient still having a lot of pain but wants to continue the same pain regimen. 6/23.  Patient less talkative today.  Still in pain. 6/24.  Wound VAC changed today in the operating room by vascular surgery.  Vascular surgery was happy about the way the wound looks.  They plan on taking back to the OR for the next 2 Mondays to change the wound VAC.  Assessment and Plan: * Amputation stump infection (HCC) 5/30 - taken to OR for debridement and wound vac placement 6/1 - wound cultures growing ESBL E coli 6/5 -went to the OR to change wound VAC and debridement 6/5.  Wound culture growing MRSA and Proteus 6/10.  Irrigation debridement of left AKA stump. -Patient currently on ertapenem and daptomycin.  Plan will be ertapenem and daptomycin upon discharge.  Antibiotics will go through 7/12. -Pain control.  Continue oxycodone to 15 mg every 6 hours as  needed. 6/24.  Wound VAC change in the operating room.  Vascular surgery thinks that the wound looks good.  They plan on taking him back to the operating room for the next 2 Mondays to change wound VAC.  Hypokalemia Replaced  Hypotension Likely due to pain meds and infection.  Midodrine tapered off.  On lower dose metoprolol.  Chronic anticoagulation Continue Eliquis  Acute blood loss anemia Last hemoglobin 8.7.  Last ferritin 14.  Patient on oral iron.  PVD (peripheral vascular disease) (HCC) Continue Eliquis  Paroxysmal atrial fibrillation (HCC) Continue Eliquis and metoprolol  COPD (chronic obstructive pulmonary disease) (HCC) Respiratory status stable  Recurrent major depressive disorder (HCC) Appears no longer on Celexa  Iron deficiency anemia Continue iron supplement.  Left thigh pain Due to AKA stump infection, with underlying chronic pain syndrome --Pain control per orders  Pressure injury of skin Present on admission.  See full description below.     Anxiety and depression Appears no longer on Celexa  Adjustment disorder with anxiety Appears no longer taking Celexa  Chronic pain syndrome Continue home Lyrica. Patient on high-dose OxyContin.  Increased oxycodone to 15 mg every 6 hours on 6/19.  GERD (gastroesophageal reflux disease) Continue PPI  Essential hypertension Continue home Imdur, metoprolol         Subjective: Patient in a lot of pain after wound VAC change.  Received some IV Dilaudid.  Taken to the OR before her chronic pain medications.  Admitted with stump infection.  Physical Exam: Vitals:   04/16/23 0932 04/16/23 8413 04/16/23 0945 04/16/23  1013  BP: 113/69  110/61 101/67  Pulse: 81 85 88 88  Resp:  11 12 16   Temp:   98.2 F (36.8 C) 97.9 F (36.6 C)  TempSrc:    Oral  SpO2: 94% 93% 94% 94%  Weight:      Height:       Physical Exam HENT:     Head: Normocephalic.     Mouth/Throat:     Pharynx: No oropharyngeal  exudate.  Eyes:     General: Lids are normal.     Conjunctiva/sclera: Conjunctivae normal.  Cardiovascular:     Rate and Rhythm: Normal rate and regular rhythm.     Heart sounds: Normal heart sounds, S1 normal and S2 normal.  Pulmonary:     Breath sounds: No decreased breath sounds or wheezing.  Abdominal:     Palpations: Abdomen is soft.     Tenderness: There is no abdominal tenderness.  Musculoskeletal:     Right Lower Extremity: Right leg is amputated above knee.     Left Lower Extremity: Left leg is amputated above knee.  Skin:    General: Skin is warm.     Comments: Wound VAC covering left leg.  Neurological:     Mental Status: She is alert and oriented to person, place, and time.     Data Reviewed: Creatinine 0.46, hemoglobin 8.7, platelet count 283, white blood cell count 5.5  Family Communication: Spoke with patient's daughter on the phone  Disposition: Status is: Inpatient Remains inpatient appropriate because: As per vascular surgery, wound looks good.  Unfortunately we are unable to set up a home health agency.  Will speak with infectious disease specialist to help decide what our plan of action would be.  Patient will need IV antibiotics through 7/12.  Planned Discharge Destination: Home    Time spent: 27 minutes Case discussed with vascular surgery.  Will discuss with infectious disease to determine plan.   Author: Alford Highland, MD 04/16/2023 11:48 AM  For on call review [email or web address].

## 2023-04-16 NOTE — Anesthesia Preprocedure Evaluation (Signed)
Anesthesia Evaluation  Patient identified by MRN, date of birth, ID band Patient awake    Reviewed: Allergy & Precautions, NPO status , Patient's Chart, lab work & pertinent test results  Airway Mallampati: III  TM Distance: >3 FB Neck ROM: full    Dental  (+) Edentulous Upper, Edentulous Lower   Pulmonary shortness of breath and with exertion, COPD,  COPD inhaler, former smoker    + decreased breath sounds      Cardiovascular Exercise Tolerance: Poor hypertension, Pt. on medications + angina  + CAD, + Past MI, + Peripheral Vascular Disease and +CHF  Normal cardiovascular exam Rhythm:Regular Rate:Normal     Neuro/Psych  PSYCHIATRIC DISORDERS Anxiety Depression    negative neurological ROS     GI/Hepatic Neg liver ROS,GERD  Medicated,,  Endo/Other  negative endocrine ROS  Morbid obesity  Renal/GU      Musculoskeletal   Abdominal  (+) + obese  Peds negative pediatric ROS (+)  Hematology negative hematology ROS (+)   Anesthesia Other Findings Past Medical History: No date: Abdominal aortic atherosclerosis (HCC)     Comment:  a. 05/2017 CTA abd/pelvis: significant atherosclerotic               dzs of the infrarenal abd Ao w/ some mural thrombus. No               aneurysm or dissection. No date: Acute focal ischemia of small intestine (HCC) 06/13/2017: Acute right-sided low back pain with right-sided sciatica 07/29/2017: AKI (acute kidney injury) (HCC) No date: Anemia No date: Anginal pain (HCC)     Comment:  a. 08/2012 Lexiscan MV: EF 54%, non ischemia/infarct. No date: Anxiety and depression No date: Baker's cyst of knee, right     Comment:  a. 07/2016 U/S: 4.1 x 1.4 x 2.9 cystic structure in R               poplitetal fossa. No date: Bell's palsy No date: CAD (coronary artery disease) No date: CHF (congestive heart failure) (HCC)     Comment:  a.) TTE 06/20/2017: EF 60-65%, no rwma, G1DD, no source                of cardiac emboli. No date: Chronic anticoagulation     Comment:  a.) apixaban No date: Chronic idiopathic constipation No date: Chronic mesenteric ischemia (HCC) No date: Chronic pain No date: COPD (chronic obstructive pulmonary disease) (HCC) No date: Dyspnea No date: Embolus of superior mesenteric artery (HCC)     Comment:  a. 05/2017 CTA Abd/pelvis: apparent thrombus or embolus               in prox SMA (70-90%); b. 05/2017 catheter directed tPA,               mechanical thrombectomy, and stenting of the SMA. 01/19/2016: Essential hypertension with goal blood pressure less than  140/90 01/19/2016: Gastroesophageal reflux disease No date: GERD (gastroesophageal reflux disease) No date: H/O colectomy 11/14/2019: History of 2019 novel coronavirus disease (COVID-19) No date: History of kidney stones No date: Hyperlipidemia No date: Hypertension No date: Morbid obesity with BMI of 40.0-44.9, adult (HCC) 06/27/2021: NSTEMI (non-ST elevated myocardial infarction) (HCC)     Comment:  a.) high sensitivity troponins trended: 893 --> 1587 -->              1748 --> 868 --> 735 ng/L 06/19/2017: Occlusive mesenteric ischemia (HCC) No date: Osteoarthritis No date: PAD (peripheral artery disease) (HCC) No date: PAF (  paroxysmal atrial fibrillation) (HCC)     Comment:  a.) CHA2DS2-VASc = 6 (sex, CHF, HTN, DVT x2, aortic               plaque). b.) rate/rhythm maintained on oral metoprolol               tartrate; chronically anticoagulated with standard dose               apixaban. No date: Palpitations No date: Peripheral edema 06/01/2016: Personal history of other malignant neoplasm of skin No date: Pneumonia No date: Pre-diabetes 06/13/2017: Primary osteoarthritis of both knees 08/06/2017: SBO (small bowel obstruction) (HCC) No date: Skin cancer of nose No date: Superior mesenteric artery thrombosis (HCC) No date: Vitamin D deficiency  Past Surgical History: 04/06/2022: AMPUTATION;  Right     Comment:  Procedure: AMPUTATION ABOVE KNEE;  Surgeon: Dew, Jason               S, MD;  Location: [facility];  Service: Vascular;                Laterality: Right; 12/07/2022: AMPUTATION     Comment:  Procedure: AMPUTATION ABOVE KNEE REVISION WITH RESECTION              OF FEMUR;  Surgeon: Dew, Jason S, MD;  Location: [facility]               [facility];  Service: Vascular;; 02/21/2023: AMPUTATION; Left     Comment:  Procedure: AMPUTATION ABOVE KNEE;  Surgeon: Dew, Jason               S, MD;  Location: [facility];  Service: General;                Laterality: Left; 03/22/2023: AMPUTATION; Left     Comment:  Procedure: AMPUTATION ABOVE KNEE STUMP DEBIDEMENT;                Surgeon: Dew, Jason S, MD;  Location: [facility];  Service:              Vascular;  Laterality: Left; 12/09/2019: AMPUTATION TOE; Left     Comment:  Procedure: AMPUTATION TOE MPJ left;  Surgeon: Baker,               Andrew, DPM;  Location: [facility];  Service: Podiatry;                Laterality: Left; No date: APPENDECTOMY 12/01/2021: APPLICATION OF WOUND VAC     Comment:  Procedure: APPLICATION OF WOUND VAC;  Surgeon: Dew,               Jason S, MD;  Location: [facility];  Service: Vascular;; 05/03/2022: APPLICATION OF WOUND VAC; Right     Comment:  Procedure: APPLICATION OF WOUND VAC;  Surgeon: Dew,               Jason S, MD;  Location: [facility];  Service: Vascular;                Laterality: Right; 07/23/2022: APPLICATION OF WOUND VAC; Left     Comment:  Procedure: APPLICATION OF WOUND VAC;  Surgeon: Brabham,               Vance W, MD;  Location: [facility];  Service: Vascular;                Laterality: Left;  Prevena 08/28/2022: APPLICATION OF WOUND VAC; Right       Comment:  Procedure: APPLICATION OF WOUND VAC;  Surgeon: Dew,               Jason S, MD;  Location: [facility];  Service: Vascular;                Laterality: Right; 12/07/2022: APPLICATION OF WOUND VAC     Comment:  Procedure: APPLICATION OF WOUND VAC;  Surgeon: Dew,                Jason S, MD;  Location: [facility];  Service: Vascular;; 03/28/2023: APPLICATION OF WOUND VAC; Left     Comment:  Procedure: LEFT STUMP WOUND VAC EXCHANGE;  Surgeon: Dew,              Jason S, MD;  Location: [facility];  Service: Vascular;                Laterality: Left; 04/02/2023: APPLICATION OF WOUND VAC; Left     Comment:  Procedure: LEFT STUMP WOUND VAC EXCHANGE;  Surgeon: Dew,              Jason S, MD;  Location: [facility];  Service: Vascular;                Laterality: Left; 07/23/2022: AXILLARY-FEMORAL BYPASS GRAFT; Left     Comment:  Procedure: BYPASS GRAFT AXILLA-BIFEMORAL;  Surgeon:               Brabham, Vance W, MD;  Location: [facility];  Service:               Vascular;  Laterality: Left; No date: CHOLECYSTECTOMY No date: COLON SURGERY 03/23/2022: COLONOSCOPY WITH PROPOFOL; N/A     Comment:  Procedure: COLONOSCOPY WITH PROPOFOL;  Surgeon: Anna,               Kiran, MD;  Location: [facility];  Service:               Gastroenterology;  Laterality: N/A;  rectal bleed 02/15/2023: CORONARY/GRAFT ACUTE MI REVASCULARIZATION; N/A     Comment:  Procedure: Coronary/Graft Acute MI Revascularization;                Surgeon: Harding, David W, MD;  Location: [facility]               [facility];  Service: Cardiovascular;  Laterality: N/A; 06/21/2020: EMBOLECTOMY OR THROMBECTOMY, WITH OR WITHOUT CATHETER;  FEMOROPOPLITEAL, AORTOILIAC ARTERY, BY LEG INCISION; N/A     Comment:  Left - Decomp Fasciotomy Leg; Ant &/Or Lat Compart Only;              Midline - Angiography, Visceral, Selective Or               Supraselective (With Or Without Flush Aortogram); Left -               Revascularize, Endovasc, Open/Percut, Iliac Artery,               Unilat, Initial Vessel; W/Translum Stent, W/Angioplasty;               Right - Revascularize, Endovasc, Open/Percut, Iliac               Artery, Ea Add`L Ipsilateral; W/Translumin Stent,               W/Angioplasty; Location [facility]; 12/01/2021:  ENDARTERECTOMY FEMORAL; Left     Comment:  Procedure: ENDARTERECTOMY FEMORAL;  Surgeon: Dew, Jason                 S, MD;  Location: [facility];  Service: Vascular;                Laterality: Left; 12/01/2021: ENDOVASCULAR REPAIR/STENT GRAFT; Left     Comment:  Procedure: ENDOVASCULAR REPAIR/STENT GRAFT;  Surgeon:               Dew, Jason S, MD;  Location: [facility];                Service: Cardiovascular;  Laterality: Left; 05/03/2022: INCISION AND DRAINAGE OF WOUND; Right     Comment:  Procedure: IRRIGATION AND DEBRIDEMENT RIGHT STUMP;                Surgeon: Dew, Jason S, MD;  Location: [facility];  Service:              Vascular;  Laterality: Right; 08/28/2022: IRRIGATION AND DEBRIDEMENT ABSCESS; Right     Comment:  Procedure: AKA IRRIGATION AND DEBRIDEMENT ABSCESS;                Surgeon: Dew, Jason S, MD;  Location: [facility];  Service:              Vascular;  Laterality: Right; 08/08/2017: LAPAROTOMY; N/A     Comment:  Procedure: EXPLORATORY LAPAROTOMY POSSIBLE BOWEL               RESECTION;  Surgeon: Pabon, Diego F, MD;  Location: [facility]               [facility];  Service: General;  Laterality: N/A; 05/17/2021: LEFT HEART CATH AND CORONARY ANGIOGRAPHY; N/A     Comment:  Procedure: LEFT HEART CATH AND CORONARY ANGIOGRAPHY;                Surgeon: Callwood, Dwayne D, MD;  Location: [facility];  Service: Cardiovascular;  Laterality: N/A; 02/15/2023: LEFT HEART CATH AND CORONARY ANGIOGRAPHY; N/A     Comment:  Procedure: LEFT HEART CATH AND CORONARY ANGIOGRAPHY;                Surgeon: Harding, David W, MD;  Location: [facility]               [facility];  Service: Cardiovascular;  Laterality: N/A; 11/17/2019: LOWER EXTREMITY ANGIOGRAPHY; Left     Comment:  Procedure: LOWER EXTREMITY ANGIOGRAPHY;  Surgeon: Dew,               Jason S, MD;  Location: [facility];  Service:               Cardiovascular;  Laterality: Left; 12/13/2020: LOWER EXTREMITY ANGIOGRAPHY;  Right     Comment:  Procedure: LOWER EXTREMITY ANGIOGRAPHY;  Surgeon: Dew,               Jason S, MD;  Location: [facility];  Service:               Cardiovascular;  Laterality: Right; 01/10/2021: LOWER EXTREMITY ANGIOGRAPHY; Right     Comment:  Procedure: LOWER EXTREMITY ANGIOGRAPHY;  Surgeon: Dew,               Jason S, MD;  Location: [facility];  Service:               Cardiovascular;  Laterality: Right; 01/24/2021: LOWER EXTREMITY ANGIOGRAPHY; Right     Comment:  Procedure: LOWER EXTREMITY   ANGIOGRAPHY;  Surgeon: Dew,               Jason S, MD;  Location: [facility];  Service:               Cardiovascular;  Laterality: Right; 06/28/2021: LOWER EXTREMITY ANGIOGRAPHY; Bilateral     Comment:  Procedure: Lower Extremity Angiography;  Surgeon: Dew,               Jason S, MD;  Location: [facility];  Service:               Cardiovascular;  Laterality: Bilateral; 11/28/2021: LOWER EXTREMITY ANGIOGRAPHY; Right     Comment:  Procedure: LOWER EXTREMITY ANGIOGRAPHY;  Surgeon: Dew,               Jason S, MD;  Location: [facility];  Service:               Cardiovascular;  Laterality: Right; 11/30/2021: LOWER EXTREMITY ANGIOGRAPHY; Left     Comment:  Procedure: Lower Extremity Angiography;  Surgeon: Dew,               Jason S, MD;  Location: [facility];  Service:               Cardiovascular;  Laterality: Left; 02/22/2022: LOWER EXTREMITY ANGIOGRAPHY; Right     Comment:  Procedure: Lower Extremity Angiography;  Surgeon: Dew,               Jason S, MD;  Location: [facility];  Service:               Cardiovascular;  Laterality: Right; 03/13/2022: LOWER EXTREMITY ANGIOGRAPHY; Right     Comment:  Procedure: Lower Extremity Angiography;  Surgeon: Dew,               Jason S, MD;  Location: [facility];  Service:               Cardiovascular;  Laterality: Right; 03/24/2022: LOWER EXTREMITY ANGIOGRAPHY; Right     Comment:  Procedure:  Lower Extremity Angiography;  Surgeon: Dew,               Jason S, MD;  Location: [facility];  Service:               Cardiovascular;  Laterality: Right; 01/17/2023: LOWER EXTREMITY ANGIOGRAPHY; Left     Comment:  Procedure: Lower Extremity Angiography;  Surgeon: Dew,               Jason S, MD;  Location: [facility];  Service:               Cardiovascular;  Laterality: Left; 11/19/2019: LOWER EXTREMITY INTERVENTION; N/A     Comment:  Procedure: LOWER EXTREMITY INTERVENTION;  Surgeon: Dew,               Jason S, MD;  Location: [facility];  Service:               Cardiovascular;  Laterality: N/A; 06/29/2021: LOWER EXTREMITY INTERVENTION; Bilateral     Comment:  Procedure: LOWER EXTREMITY INTERVENTION;  Surgeon: Dew,               Jason S, MD;  Location: [facility];  Service:               Cardiovascular;  Laterality: Bilateral; 01/18/2023: LOWER EXTREMITY   INTERVENTION; Left     Comment:  Procedure: LOWER EXTREMITY INTERVENTION;  Surgeon: Dew,               Jason S, MD;  Location: [facility];  Service:               Cardiovascular;  Laterality: Left; 06/22/2017: TEE WITHOUT CARDIOVERSION; N/A     Comment:  Procedure: TRANSESOPHAGEAL ECHOCARDIOGRAM (TEE);                Surgeon: Arida, Muhammad A, MD;  Location: [facility];                Service: Cardiovascular;  Laterality: N/A; 07/11/2018: TOTAL KNEE ARTHROPLASTY; Right     Comment:  Procedure: TOTAL KNEE ARTHROPLASTY;  Surgeon: Poggi,               John J, MD;  Location: [facility];  Service: Orthopedics;                Laterality: Right; No date: VAGINAL HYSTERECTOMY 06/20/2017: VISCERAL ARTERY INTERVENTION; N/A     Comment:  Procedure: Visceral Artery Intervention, possible aortic              thrombectomy;  Surgeon: Dew, Jason S, MD;  Location: [facility];  Service: Cardiovascular;  Laterality:               N/A; 01/28/2018: VISCERAL ARTERY INTERVENTION; N/A      Comment:  Procedure: VISCERAL ARTERY INTERVENTION;  Surgeon: Dew,               Jason S, MD;  Location: [facility];  Service:               Cardiovascular;  Laterality: N/A;  BMI    Body Mass Index: 32.01 kg/m      Reproductive/Obstetrics negative OB ROS                             Anesthesia Physical Anesthesia Plan  ASA: 4  Anesthesia Plan: General   Post-op Pain Management: Minimal or no pain anticipated   Induction: Intravenous  PONV Risk Score and Plan: 3 and Propofol infusion, TIVA and Ondansetron  Airway Management Planned: Natural Airway and Nasal Cannula  Additional Equipment: None  Intra-op Plan:   Post-operative Plan:   Informed Consent: I have reviewed the patients History and Physical, chart, labs and discussed the procedure including the risks, benefits and alternatives for the proposed anesthesia with the patient or authorized representative who has indicated his/her understanding and acceptance.     Dental advisory given  Plan Discussed with: CRNA and Surgeon  Anesthesia Plan Comments: (Discussed risks of anesthesia with patient, including possibility of difficulty with spontaneous ventilation under anesthesia necessitating airway intervention, PONV, and rare risks such as cardiac or respiratory or neurological events, and allergic reactions. Discussed the role of CRNA in patient's perioperative care. Patient understands.)       Anesthesia Quick Evaluation  

## 2023-04-16 NOTE — Anesthesia Postprocedure Evaluation (Signed)
Anesthesia Post Note  Patient: CALLISTA HOH  Procedure(s) Performed: LEFT STUMP WOUND VAC EXCHANGE (Left)  Patient location during evaluation: PACU Anesthesia Type: General Level of consciousness: awake and alert Pain management: pain level controlled Vital Signs Assessment: post-procedure vital signs reviewed and stable Respiratory status: spontaneous breathing, nonlabored ventilation, respiratory function stable and patient connected to nasal cannula oxygen Cardiovascular status: blood pressure returned to baseline and stable Postop Assessment: no apparent nausea or vomiting Anesthetic complications: no   No notable events documented.   Last Vitals:  Vitals:   04/16/23 0932 04/16/23 0937  BP: 113/69   Pulse: 81 85  Resp:  11  Temp:    SpO2: 94% 93%    Last Pain:  Vitals:   04/16/23 0937  TempSrc:   PainSc: Asleep                 Corinda Gubler

## 2023-04-16 NOTE — Progress Notes (Signed)
Date of Admission:  03/21/2023      ID: Carla [name] is a 62 y.o. female  Principal Problem:   Amputation stump infection (HCC) Active Problems:   Essential hypertension   GERD (gastroesophageal reflux disease)   Chronic pain syndrome   Recurrent major depressive disorder (HCC)   Hypotension   Adjustment disorder with anxiety   Paroxysmal atrial fibrillation (HCC)   COPD (chronic obstructive pulmonary disease) (HCC)   PVD (peripheral vascular disease) (HCC)   Hypokalemia   Chronic anticoagulation   Anxiety and depression   Acute blood loss anemia   Iron deficiency anemia   Pressure injury of skin   Left thigh pain   MRSA infection   Infection due to ESBL-producing Escherichia coli   Back to OR today 04/16/23 Subjective: Pt doing better  Medications:   acetaminophen  1,000 mg Oral Q8H   apixaban  5 mg Oral BID   ascorbic acid  500 mg Oral BID   atorvastatin  40 mg Oral QHS   vitamin B-12  1,000 mcg Oral Daily   feeding supplement  237 mL Oral BID BM   ferrous sulfate  325 mg Oral Q breakfast   folic acid  1 mg Oral Daily   isosorbide mononitrate  120 mg Oral Daily   metoprolol tartrate  12.5 mg Oral BID   nystatin cream   Topical BID   oxyCODONE  40 mg Oral Q12H   pantoprazole  40 mg Oral Daily   polyethylene glycol  17 g Oral Daily   pregabalin  200 mg Oral TID   senna-docusate  1 tablet Oral BID   Vitamin D (Ergocalciferol)  50,000 Units Oral Q7 days   zinc sulfate  220 mg Oral Daily    Objective: Vital signs in last 24 hours: Patient Vitals for the past 24 hrs:  BP Temp Temp src Pulse Resp SpO2  04/16/23 1013 101/67 97.9 F (36.6 C) Oral 88 16 94 %  04/16/23 0945 110/61 98.2 F (36.8 C) -- 88 12 94 %  04/16/23 0937 -- -- -- 85 11 93 %  04/16/23 0932 113/69 -- -- 81 -- 94 %  04/16/23 0930 113/69 -- -- 81 12 94 %  04/16/23 0927 -- -- -- 87 13 94 %  04/16/23 0924 -- -- -- 85 15 92 %  04/16/23 0919 -- -- -- 84 14 93 %  04/16/23 0915 101/65 -- -- 86  -- 93 %  04/16/23 0909 -- -- -- 82 10 92 %  04/16/23 0904 -- -- -- 85 16 93 %  04/16/23 0900 130/67 -- -- 93 (!) 9 94 %  04/16/23 0855 -- -- -- 79 14 96 %  04/16/23 0850 -- -- -- 80 11 100 %  04/16/23 0843 106/69 (!) 97.5 F (36.4 C) -- 76 11 100 %  04/16/23 0655 111/69 97.9 F (36.6 C) Temporal 78 18 93 %  04/16/23 0518 92/69 98 F (36.7 C) Oral 84 18 91 %  04/16/23 0059 (!) 102/54 -- -- -- -- --  04/15/23 2220 (!) 96/52 -- -- 82 -- --  04/15/23 2220 (!) 96/52 -- -- 86 -- --  04/15/23 2117 (!) 93/49 98.2 F (36.8 C) Oral 80 16 93 %  04/15/23 1626 (!) 111/54 98.2 F (36.8 C) -- 77 18 94 %     PHYSICAL EXAM:  General: Alert, cooperative, no distress Lungs: Clear to auscultation bilaterally. No Wheezing or Rhonchi. No rales. Heart:s1s2 Abdomen: Soft, non-tender,not distended. Bowel  sounds normal. No masses Extremities: b/l AKA- - Left stump- wound vac Skin: No rashes or lesions. Or bruising Lymph: Cervical, supraclavicular normal. Neurologic: Grossly non-focal  Lab Results    Latest Ref Rng & Units 04/16/2023    3:34 AM 04/13/2023    6:28 AM 04/11/2023    5:15 AM  CBC  WBC 4.0 - 10.5 K/uL 5.5  5.7  5.2   Hemoglobin 12.0 - 15.0 g/dL 8.7  8.4  8.2   Hematocrit 36.0 - 46.0 % 29.2  28.3  27.4   Platelets 150 - 400 K/uL 283  296  307        Latest Ref Rng & Units 04/16/2023    3:34 AM 04/13/2023    6:28 AM 04/11/2023    5:15 AM  CMP  Glucose 70 - 99 mg/dL 94  93  161   BUN 8 - 23 mg/dL 12  18  20    Creatinine 0.44 - 1.00 mg/dL 0.96  0.45  4.09   Sodium 135 - 145 mmol/L 138  136  137   Potassium 3.5 - 5.1 mmol/L 4.2  4.5  4.8   Chloride 98 - 111 mmol/L 105  101  103   CO2 22 - 32 mmol/L 27  25  26    Calcium 8.9 - 10.3 mg/dL 8.6  8.7  8.5       Microbiology: Left AKA stump  surgical wound has ESBL ecoli and proteus in the wound MRSA     Assessment/Plan: 62 y.o. female with a history of A-fib, CHF, PAD, COPD, HLD, hypertension, right AKA, left leg irreversible  ischemia April 2024 and was in [facility] 02/15/23-02/24/23 needing left above-the-knee amputation on 02/21/2023 She went for a vascular follow up appt on 03/21/23 and they noted the surgical site was necrotic and had purulent discharge. She was asked to get admitted   Left AKA stump infection- ESBL ecoli and proteus and MRSA  in the  wound Currently on meropenem and daptomycin   She is going to need Iv antibitcs for 4 -6 weeks- being treted like osteomyelitis Will need IV antibiotic ( ertapenem and dapto on discharge) would need until 05/04/23 Went to OR today 04/16/23 As per vascular the wound looks much better and she does not need IV anymore Will give her omadacycline ( 450mg  X 2 days followed by 300mg  until 7/12 . Earle Gell will treat MRSA and ESBl ecoli The proteus will be treated by augmentin 875 mg PO BID until 7/12 Pt has taken omadacycline before  Will do labs ( CBC/CMP/ESR/CRP) in 7-10 days     Rt AKA stump= wound Is very small and almost closed Had chronic infection for 3 months ( MRSA, ESBL, enterococcus) and took many courses of antibiotics   Current smoker Severe PAD with stents   CAD Imdur Metoprolol atorvastatin   HTN? ? AFIB controlled- on eliquis   Anemia   Discussed the management with the patient and care team Will follow her as OP

## 2023-04-16 NOTE — Interval H&P Note (Signed)
History and Physical Interval Note:  04/16/2023 7:52 AM  Carla [name]  has presented today for surgery, with the diagnosis of infection.  The various methods of treatment have been discussed with the patient and family. After consideration of risks, benefits and other options for treatment, the patient has consented to  Procedure(s): LEFT STUMP WOUND VAC EXCHANGE (Left) as a surgical intervention.  The patient's history has been reviewed, patient examined, no change in status, stable for surgery.  I have reviewed the patient's chart and labs.  Questions were answered to the patient's satisfaction.     Festus Barren

## 2023-04-16 NOTE — Transfer of Care (Signed)
Immediate Anesthesia Transfer of Care Note  Patient: Carla [name]  Procedure(s) Performed: LEFT STUMP WOUND VAC EXCHANGE (Left)  Patient Location: PACU  Anesthesia Type:MAC  Level of Consciousness: drowsy  Airway & Oxygen Therapy: Patient Spontanous Breathing and Patient connected to face mask oxygen  Post-op Assessment: Report given to RN and Post -op Vital signs reviewed and stable  Post vital signs: stable  Last Vitals:  Vitals Value Taken Time  BP    Temp    Pulse    Resp    SpO2      Last Pain:  Vitals:   04/16/23 0655  TempSrc: Temporal  PainSc: 9       Patients Stated Pain Goal: 6 (04/14/23 1541)  Complications: No notable events documented.

## 2023-04-16 NOTE — TOC Benefit Eligibility Note (Signed)
Pharmacy Patient Advocate Encounter  Insurance verification completed.    The patient is insured through [facility] Medicare Part D  Ran test claim for Nuzyra 150 mg tablets and the current 30 day co-pay is $0.00.   This test claim was processed through [facility]- copay amounts may vary at other pharmacies due to pharmacy/plan contracts, or as the patient moves through the different stages of their insurance plan.    Roland Earl, CPHT Pharmacy Patient Advocate Specialist [facility] Patient Advocate Team Direct Number: [phone]  Fax: [phone]

## 2023-04-16 NOTE — Op Note (Signed)
    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  We did a Kerecis skin graft last week, VAC dressing this did not take so she is brought in for further debridement and replacement of Kerecis synthetic skin graft.  Risks and benefits were discussed. An assistant was present during the procedure to help facilitate the exposure and expedite the procedure. .  DESCRIPTION: After obtaining full informed written consent, the patient was brought back to the operating room and placed supine upon the operating table.  The patient received IV antibiotics prior to induction.  After obtaining adequate anesthesia, the patient was prepped and draped in the standard fashion. The assistant provided retraction and mobilization to help facilitate exposure and expedite the procedure throughout the entire procedure.  This included following suture, using retractors, and optimizing lighting.  The wound was then opened and excisional debridement was performed to skin and soft tissue to remove all clearly non-viable tissue.  Most of this was on the anterior flap and it was largely some necrotic skin edge and fat.  The tissue was taken back to bleeding tissue that appeared viable.  The debridement was performed with Metzenbaum scissors and encompassed an area of approximately 75 cm2.  The wound measured approximately 15 cm in its longest length, 6 cm in its  widest width, and 2 cm in depth.  After all clearly non-viable tissue was removed, micronized Kerecis was placed in the wound and then a meshed piece of Kerecis synthetic skin graft was placed to encompass the entirety of the wound.  This was stapled in place.  A negative pressure dressing was then placed over the [facility] synthetic skin graft.  Strips of [place] were used for an occlusive seal and once it was connected to suction tubing a good seal was obtained.. The patient was then awakened from anesthesia and taken to the recovery room in stable condition having tolerated the procedure well.  COMPLICATIONS: none  CONDITION: stable  Festus Barren  04/16/2023, 8:20 AM   This note was created with Dragon Medical transcription system. Any errors in dictation are purely unintentional.

## 2023-04-17 ENCOUNTER — Encounter: Payer: Self-pay | Admitting: Vascular Surgery

## 2023-04-17 ENCOUNTER — Other Ambulatory Visit: Payer: Self-pay

## 2023-04-17 DIAGNOSIS — J439 Emphysema, unspecified: Secondary | ICD-10-CM

## 2023-04-17 LAB — HEPATIC FUNCTION PANEL
ALT: 10 U/L (ref 0–44)
AST: 10 U/L — ABNORMAL LOW (ref 15–41)
Albumin: 2.6 g/dL — ABNORMAL LOW (ref 3.5–5.0)
Alkaline Phosphatase: 93 U/L (ref 38–126)
Bilirubin, Direct: <0.1 mg/dL (ref 0.0–0.2)
Total Bilirubin: 0.2 mg/dL — ABNORMAL LOW (ref 0.3–1.2)
Total Protein: 6.3 g/dL — ABNORMAL LOW (ref 6.5–8.1)

## 2023-04-17 LAB — SEDIMENTATION RATE: Sed Rate: 70 mm/hr — ABNORMAL HIGH (ref 0–30)

## 2023-04-17 LAB — C-REACTIVE PROTEIN: CRP: 0.9 mg/dL (ref <1.0)

## 2023-04-17 MED ORDER — OMADACYCLINE TOSYLATE 150 MG PO TABS
300.0000 mg | ORAL_TABLET | Freq: Every day | ORAL | 0 refills | Status: AC
  Filled 2023-04-17: qty 36, 18d supply, fill #0

## 2023-04-17 MED ORDER — POLYETHYLENE GLYCOL 3350 17 G PO PACK: 17.0000 g | PACK | Freq: Every day | ORAL | 0 refills | Status: AC | PRN

## 2023-04-17 MED ORDER — ZINC SULFATE 220 (50 ZN) MG PO CAPS: 220.0000 mg | ORAL_CAPSULE | Freq: Every day | ORAL | 0 refills | Status: AC

## 2023-04-17 MED ORDER — MORPHINE SULFATE ER 15 MG PO TBCR: EXTENDED_RELEASE_TABLET | ORAL | 0 refills | Status: DC

## 2023-04-17 MED ORDER — NYSTATIN 100000 UNIT/GM EX CREA: TOPICAL_CREAM | Freq: Two times a day (BID) | CUTANEOUS | 0 refills | Status: AC

## 2023-04-17 MED ORDER — MIDODRINE HCL 5 MG PO TABS
2.5000 mg | ORAL_TABLET | Freq: Three times a day (TID) | ORAL | Status: DC
  Administered 2023-04-17 (×2): 2.5 mg via ORAL
  Filled 2023-04-17 (×2): qty 1

## 2023-04-17 MED ORDER — XTAMPZA ER 36 MG PO C12A: 1.0000 | EXTENDED_RELEASE_CAPSULE | Freq: Two times a day (BID) | ORAL | 0 refills | Status: AC

## 2023-04-17 MED ORDER — METOPROLOL TARTRATE 25 MG PO TABS: 12.5000 mg | ORAL_TABLET | Freq: Two times a day (BID) | ORAL | 0 refills | Status: AC

## 2023-04-17 MED ORDER — FOLIC ACID 1 MG PO TABS: 1.0000 mg | ORAL_TABLET | Freq: Every day | ORAL | 0 refills | Status: AC

## 2023-04-17 MED ORDER — OXYCODONE HCL 15 MG PO TABS: 15.0000 mg | ORAL_TABLET | Freq: Four times a day (QID) | ORAL | 0 refills | Status: AC | PRN

## 2023-04-17 MED ORDER — MIDODRINE HCL 2.5 MG PO TABS: 2.5000 mg | ORAL_TABLET | Freq: Three times a day (TID) | ORAL | 0 refills | Status: AC

## 2023-04-17 NOTE — H&P (View-Only) (Signed)
Progress Note    04/17/2023 11:07 AM 1 Day Post-Op  Subjective:   Carla [name] is a 62 year old female who presented to the vein and vascular clinic with an infected left stump status post left above-the-knee amputation 02/21/2023. She had had multiple trips to the operating room for wound vac changes. Kerecis biologic was placed in the wound with each change. Patient is recovered and will be discharged to home with wound vac in place. She will be returning as outpatient next Monday to the OR for another wound vac change. Patient has her personal wound vac with her that she will be connected to in order to be discharged later today.    Vitals:   04/17/23 0204 04/17/23 0819  BP: (!) 140/71 92/73  Pulse: 75 75  Resp:  16  Temp:    SpO2: 97% 94%   Physical Exam: Cardiac:  HX Atrial fibrillation rate in the 90's Lungs:  Clear on auscultation throughout.  Incisions:  Left stump incision dressed with wound vac. Working well Extremities:  Bilateral lower extremity AKA's. Left with infection and now wound vac  Abdomen:  Positive bowel sounds. Right lower quadrant hernia.  Neurologic: AAOX3 follows commands.    CBC    Component Value Date/Time   WBC 5.5 04/16/2023 0334   RBC 3.24 (L) 04/16/2023 0334   HGB 8.7 (L) 04/16/2023 0334   HGB 13.4 02/12/2015 1137   HCT 29.2 (L) 04/16/2023 0334   HCT 40.9 02/12/2015 1137   PLT 283 04/16/2023 0334   PLT 272 02/12/2015 1137   MCV 90.1 04/16/2023 0334   MCV 90 02/12/2015 1137   MCH 26.9 04/16/2023 0334   MCHC 29.8 (L) 04/16/2023 0334   RDW 17.2 (H) 04/16/2023 0334   RDW 14.8 (H) 02/12/2015 1137   LYMPHSABS 1.6 04/11/2023 0515   LYMPHSABS 1.7 08/27/2014 1145   MONOABS 0.4 04/11/2023 0515   MONOABS 0.4 08/27/2014 1145   EOSABS 0.4 04/11/2023 0515   EOSABS 0.0 08/27/2014 1145   BASOSABS 0.0 04/11/2023 0515   BASOSABS 0.0 08/27/2014 1145    BMET    Component Value Date/Time   NA 138 04/16/2023 0334   NA 140 02/12/2015 1137   K 4.2  04/16/2023 0334   K 2.9 (L) 02/12/2015 1137   CL 105 04/16/2023 0334   CL 107 02/12/2015 1137   CO2 27 04/16/2023 0334   CO2 27 02/12/2015 1137   GLUCOSE 94 04/16/2023 0334   GLUCOSE 120 (H) 02/12/2015 1137   BUN 12 04/16/2023 0334   BUN 15 02/12/2015 1137   CREATININE 0.46 04/16/2023 0334   CREATININE 0.78 02/12/2015 1137   CALCIUM 8.6 (L) 04/16/2023 0334   CALCIUM 9.0 02/12/2015 1137   GFRNONAA >60 04/16/2023 0334   GFRNONAA >60 02/12/2015 1137   GFRAA >60 11/23/2019 1330   GFRAA >60 02/12/2015 1137    INR    Component Value Date/Time   INR 1.2 04/09/2023 0838   INR 0.8 07/23/2012 0433     Intake/Output Summary (Last 24 hours) at 04/17/2023 1107 Last data filed at 04/17/2023 1038 Gross per 24 hour  Intake 720 ml  Output 551 ml  Net 169 ml     Assessment/Plan:  62 y.o. female is s/p  Left Lower extremity AKA wound washout with Vac re-application and Kerecis biologic placed  1 Day Post-Op   PLAN: Patient to be discharged home later today connected to her personal home wound vac.  Patient will be discharged home on oral antibiotics via Infectious Disease.  Patient needs to take as prescribed.  Patient will return to [facility] Pre-op next Monday morning at 6:30 am for wound Vac change as outpatient to be discharged home later the same day.   DVT prophylaxis:  Eliquis 5 mg twice daily.    [name] 04/17/2023 11:07 AM

## 2023-04-17 NOTE — Discharge Summary (Signed)
Physician Discharge Summary   Patient: Carla [name] MRN: [id number] DOB: December 06, 1960  Admit date:     03/21/2023  Discharge date: 04/17/23  Discharge Physician: Alford Highland   PCP: [facility]   Recommendations at discharge:   Follow-up with PCP on Friday Follow-up with Dr. Wyn Quaker for the next 2 Mondays for change of wound VAC in the operating room Recommend checking CBC, CMP, ESR and CRP in 7 to 10 days.  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  Pt was admitted and started on IV antibiotics. Vascular surgery was consulted and took patient to the OR on 5/30 for debridement and wound vac placement.   6/5 status postdebridement by vascular surgery in the operating room. 6/10.  Irrigation debridement of skin and soft tissue done by vascular surgery.  6/19.  Vascular surgery planning to take back to the operating room on 6/24 to change wound VAC.  Increased oral oxycodone to 15 mg. 6/20.  Pain seems better  controlled but still having pain.  Continue same regimen today. 6/21&22.  Patient still having a lot of pain but wants to continue the same pain regimen. 6/23.  Patient less talkative today.  Still in pain. 6/24.  Wound VAC changed today in the operating room by vascular surgery.  Vascular surgery was happy about the way the wound looks.  They plan on taking back to the OR for the next 2 Mondays to change the wound VAC. 6/25.  Upon discharge infectious disease specialist changed antibiotics over to omadacycline 450 mg for 2 days followed by 300 mg until 7/12.  This will treat MRSA and ESBL E. coli.  Patient will be on Augmentin 875 mg twice a day until 712 which would cover the Proteus.  Xtampza 36 mg twice a day plus oxycodone 15 mg every 6 hours prescribed into her pharmacy for 4 days.  Refills to be prescribed by her PMD.  She has an appointment Friday with her PMD.  Assessment and Plan: * Amputation stump infection (HCC) 5/30 - taken to OR for debridement and wound vac placement 6/1 - wound cultures growing ESBL E coli 6/5 -went to the OR to change wound VAC and debridement 6/5.  Wound culture growing MRSA and Proteus 6/10.  Irrigation debridement of left AKA stump. -Patient currently on ertapenem and daptomycin.  Plan will be ertapenem and daptomycin upon discharge.  Antibiotics will go through 7/12. -Pain control.  Continue oxycodone to 15 mg every 6 hours as needed. 6/24.  Wound VAC change in the operating room.  Vascular surgery thinks that the wound  looks good.  They plan on taking him back to the operating room for the next 2 Mondays to change wound VAC. 6/25.  Wound care nurse connected the home wound VAC machine tubing to be wound VAC already present on her leg.  Antibiotics were delivered to her room.  Pain medications E scribed into pharmacy and I did speak with the pharmacist about these medications (Xtampza and oxycodone).  Hypokalemia Replaced  Hypotension Likely due to pain  meds and infection.  Midodrine low-dose prescribed.  On lower dose metoprolol.  Chronic anticoagulation Continue Eliquis  Acute blood loss anemia Last hemoglobin 8.7.  Last ferritin 14.  Patient on oral iron.  PVD (peripheral vascular disease) (HCC) Continue Eliquis  Paroxysmal atrial fibrillation (HCC) Continue Eliquis and metoprolol  COPD (chronic obstructive pulmonary disease) (HCC) Respiratory status stable  Recurrent major depressive disorder (HCC) Appears no longer on Celexa  Iron deficiency anemia Continue iron supplement.  Left thigh pain Due to AKA stump infection, with underlying chronic pain syndrome --Pain control per orders  Pressure injury of skin Present on admission.  See full description below.     Anxiety and depression Appears no longer on Celexa  Adjustment disorder with anxiety Appears no longer taking Celexa  Chronic pain syndrome Continue home Lyrica. Switch to OxyContin 40 mg twice a day to Xtampza 36 mg twice a day upon discharge.  Increased oxycodone to 15 mg every 6 hours on 6/19.  GERD (gastroesophageal reflux disease) Continue PPI  Essential hypertension Continue home Imdur, metoprolol          Consultants: Vascular surgery, infectious disease, wound care nurse Procedures performed: Wound VAC changes in the operating room, debridement left stump infection Disposition: Home Diet recommendation:  Cardiac diet DISCHARGE MEDICATION: Allergies as of 04/17/2023       Reactions   Gabapentin    Tremors    Bactrim [sulfamethoxazole-trimethoprim] Itching        Medication List     STOP taking these medications    aspirin EC 81 MG tablet   citalopram 40 MG tablet Commonly known as: CeleXA   HYDROcodone-acetaminophen 10-325 MG tablet Commonly known as: NORCO   lidocaine 5 % Commonly known as: LIDODERM   nicotine 21 mg/24hr patch Commonly known as: NICODERM CQ - dosed in mg/24 hours   oxyCODONE-acetaminophen  10-325 MG tablet Commonly known as: PERCOCET       TAKE these medications    amoxicillin-clavulanate 875-125 MG tablet Commonly known as: AUGMENTIN Take 1 tablet by mouth 2 (two) times daily.   apixaban 5 MG Tabs tablet Commonly known as: ELIQUIS Take 1 tablet (5 mg total) by mouth 2 (two) times daily.   ascorbic acid 500 MG tablet Commonly known as: VITAMIN C Take 1 tablet (500 mg total) by mouth 2 (two) times daily.   atorvastatin 80 MG tablet Commonly known as: LIPITOR Take 80 mg by mouth at bedtime.   Combivent Respimat 20-100 MCG/ACT Aers respimat Generic drug: Ipratropium-Albuterol Inhale 1 puff into the lungs every 6 (six) hours as needed.   cyanocobalamin 1000 MCG tablet Take 1 tablet (1,000 mcg total) by mouth daily.   diphenoxylate-atropine 2.5-0.025 MG tablet Commonly known as: LOMOTIL Take 1 tablet by mouth 4 (four) times daily.   esomeprazole 20 MG capsule Commonly known as: NEXIUM Take 20 mg by mouth daily.   feeding supplement Liqd Take 237 mLs by mouth 2 (two) times daily between meals.   ferrous sulfate 325 (65 FE) MG tablet Take 325 mg by  mouth daily.   folic acid 1 MG tablet Commonly known as: FOLVITE Take 1 tablet (1 mg total) by mouth daily. Start taking on: April 18, 2023   isosorbide mononitrate 120 MG 24 hr tablet Commonly known as: IMDUR Take 1 tablet (120 mg total) by mouth daily.   metoprolol tartrate 25 MG tablet Commonly known as: LOPRESSOR Take 0.5 tablets (12.5 mg total) by mouth 2 (two) times daily. What changed:  medication strength how much to take   midodrine 2.5 MG tablet Commonly known as: PROAMATINE Take 1 tablet (2.5 mg total) by mouth 3 (three) times daily with meals.   nystatin cream Commonly known as: MYCOSTATIN Apply topically 2 (two) times daily.   Omadacycline Tosylate 150 MG Tabs Take 3 tablets (total 450mg ) by mouth once daily in the morning on 6/26 and 6/27. On 6/28 start taking 2 tablets (300mg ) once  daily in the morning. Take on empty stomach with water. nothing to eat 4hr before and 2hr after dose.  Avoid vitamins and dairy 4hrs after dose.   oxyCODONE 15 MG immediate release tablet Commonly known as: ROXICODONE Take 1 tablet (15 mg total) by mouth every 6 (six) hours as needed for breakthrough pain or severe pain.   polyethylene glycol 17 g packet Commonly known as: MIRALAX / GLYCOLAX Take 17 g by mouth daily as needed for severe constipation.   pregabalin 200 MG capsule Commonly known as: LYRICA Take 1 capsule (200 mg total) by mouth in the morning, at noon, and at bedtime.   Xtampza ER 36 MG C12a Generic drug: oxyCODONE ER Take 1 capsule (36 mg total) by mouth 2 (two) times daily for 4 days.   zinc sulfate 220 (50 Zn) MG capsule Take 1 capsule (220 mg total) by mouth daily.               Durable Medical Equipment  (From admission, onward)           Start     Ordered   03/22/23 1353  For home use only DME Negative pressure wound device  Once       Question Answer Comment  Frequency of dressing change 2 times per week   Length of need 3 Months   Dressing type Foam   Amount of suction 125 mm/Hg   Pressure application Continuous pressure   Supplies 10 canisters and 15 dressings per month for duration of therapy      03/22/23 1352            Follow-up Information     your medical doctor Follow up.   Why: Keep appointment Friday        Dr Wyn Quaker Follow up.   Why: will bring you back to the OR on Monday for wound vac change               Discharge Exam: Filed Weights   03/21/23 1410 03/22/23 1006 03/28/23 0953  Weight: 81.6 kg 81.6 kg 79.4 kg   Physical Exam HENT:     Head: Normocephalic.     Mouth/Throat:     Pharynx: No oropharyngeal exudate.  Eyes:     General: Lids are normal.     Conjunctiva/sclera: Conjunctivae normal.  Cardiovascular:     Rate and Rhythm: Normal rate and regular rhythm.     Heart sounds: Normal heart sounds,  S1 normal and S2 normal.  Pulmonary:     Breath sounds: No decreased breath sounds or wheezing.  Abdominal:  Palpations: Abdomen is soft.     Tenderness: There is no abdominal tenderness.  Musculoskeletal:     Right Lower Extremity: Right leg is amputated above knee.     Left Lower Extremity: Left leg is amputated above knee.  Skin:    General: Skin is warm.     Comments: Wound VAC covering left leg.  Neurological:     Mental Status: She is alert and oriented to person, place, and time.      Condition at discharge: fair  The results of significant diagnostics from this hospitalization (including imaging, microbiology, ancillary and laboratory) are listed below for reference.   Imaging Studies: CT FEMUR LEFT W CONTRAST  Result Date: 03/21/2023 CLINICAL DATA:  Soft tissue infection suspected, thigh, xray done Pt to ED from vascular for infection to left thigh. Pt bilateral leg amputee. States has been having chills for the past few days. EXAM: CT OF THE LOWER LEFT EXTREMITY WITH CONTRAST TECHNIQUE: Multidetector CT imaging of the lower left extremity was performed according to the standard protocol following intravenous contrast administration. RADIATION DOSE REDUCTION: This exam was performed according to the departmental dose-optimization program which includes automated exposure control, adjustment of the mA and/or kV according to patient size and/or use of iterative reconstruction technique. CONTRAST:  OMNIPAQUE IOHEXOL 300 MG/ML  SOLN COMPARISON:  X-ray left femur 03/21/2023 FINDINGS: Vascular: Bilateral partially visualized common iliac artery stent with the left obstructed and right almost fully obstructed. Diminutive left common femoral and superficial femoral arteries with retrograde opacification from the inferior epigastric artery. Bones/Joint/Cartilage Above the knee amputation. No evidence of fracture, dislocation, or joint effusion. No evidence of severe arthropathy. No  aggressive appearing focal bone abnormality. Ligaments Muscles and Tendons Atrophic musculature. Suboptimally assessed by CT. Soft tissues Partially visualized large ventral wall hernia containing bowel with an abdominal defect of 12.5 cm. Skin staples overlying the amputation site with couple foci of gas likely postsurgical. Mild subcutaneus soft tissue edema. Organized fluid collection. Other: Colonic diverticulosis. IMPRESSION: 1. Bilateral partially visualized common iliac artery stent with the left obstructed and right almost fully obstructed. Diminutive left common femoral and superficial femoral arteries with retrograde opacification from the inferior epigastric artery. 2. Above the knee amputation. Skin staples overlying the amputation site with couple foci of gas likely postsurgical. Electronically Signed   By: Tish Frederickson M.D.   On: 03/21/2023 20:20   DG Femur Min 2 Views Left  Result Date: 03/21/2023 CLINICAL DATA:  Left thigh infection. Status post left above knee amputation. EXAM: LEFT FEMUR 2 VIEWS COMPARISON:  None Available. FINDINGS: Status post left above knee amputation. No acute fracture or dislocation is noted. No lytic destruction is seen to suggest osteomyelitis. Surgical staples are seen in the soft tissues of the stump. IMPRESSION: Status post left above knee amputation. No fracture or evidence of osteomyelitis is noted. Electronically Signed   By: Lupita Raider M.D.   On: 03/21/2023 17:41    Microbiology: Results for orders placed or performed during the hospital encounter of 03/21/23  Blood Culture (routine x 2)     Status: None   Collection Time: 03/21/23  4:21 PM   Specimen: BLOOD  Result Value Ref Range Status   Specimen Description BLOOD BLOOD RIGHT HAND  Final   Special Requests   Final    BOTTLES DRAWN AEROBIC AND ANAEROBIC Blood Culture adequate volume   Culture   Final    NO GROWTH 5 DAYS Performed at [facility], [address] [place],  [place] [address]    Report Status 03/26/2023 FINAL  Final  Blood Culture (routine x 2)     Status: None   Collection Time: 03/21/23  4:49 PM   Specimen: BLOOD  Result Value Ref Range Status   Specimen Description BLOOD BLOOD LEFT HAND  Final   Special Requests   Final    BOTTLES DRAWN AEROBIC AND ANAEROBIC Blood Culture results may not be optimal due to an inadequate volume of blood received in culture bottles   Culture   Final    NO GROWTH 5 DAYS Performed at [facility], [address], [place], [place] [address]    Report Status 03/26/2023 FINAL  Final  Aerobic/Anaerobic Culture w Gram Stain (surgical/deep wound)     Status: None   Collection Time: 03/22/23 11:51 AM   Specimen: Path Tissue  Result Value Ref Range Status   Specimen Description   Final    TISSUE Performed at [facility], [address] [place], [place] [address]    Special Requests SWAB OF LEFT AKA STUMP WOUND  Final   Gram Stain   Final    RARE WBC PRESENT,BOTH PMN AND MONONUCLEAR RARE GRAM POSITIVE COCCI RARE GRAM POSITIVE RODS RARE GRAM NEGATIVE RODS    Culture   Final    FEW ESCHERICHIA COLI FEW PROTEUS MIRABILIS Confirmed Extended Spectrum Beta-Lactamase Producer (ESBL).  In bloodstream infections from ESBL organisms, carbapenems are preferred over piperacillin/tazobactam. They are shown to have a lower risk of mortality. RARE DIPHTHEROIDS(CORYNEBACTERIUM SPECIES) Standardized susceptibility testing for this organism is not available. NO ANAEROBES ISOLATED Performed at [facility], [address], [place], [place] [address]    Report Status 03/27/2023 FINAL  Final   Organism ID, Bacteria ESCHERICHIA COLI  Final   Organism ID, Bacteria PROTEUS MIRABILIS  Final      Susceptibility   Escherichia coli - MIC*    AMPICILLIN >=32 RESISTANT Resistant     CEFEPIME >=32 RESISTANT Resistant     CEFTAZIDIME >=64 RESISTANT Resistant     CEFTRIAXONE >=64 RESISTANT Resistant      CIPROFLOXACIN >=4 RESISTANT Resistant     GENTAMICIN <=1 SENSITIVE Sensitive     IMIPENEM <=0.25 SENSITIVE Sensitive     TRIMETH/SULFA >=320 RESISTANT Resistant     AMPICILLIN/SULBACTAM 16 INTERMEDIATE Intermediate     PIP/TAZO 64 INTERMEDIATE Intermediate     * FEW ESCHERICHIA COLI   Proteus mirabilis - MIC*    AMPICILLIN <=2 SENSITIVE Sensitive     CEFEPIME <=0.12 SENSITIVE Sensitive     CEFTAZIDIME <=1 SENSITIVE Sensitive     CEFTRIAXONE <=0.25 SENSITIVE Sensitive     CIPROFLOXACIN <=0.25 SENSITIVE Sensitive     GENTAMICIN <=1 SENSITIVE Sensitive     IMIPENEM 4 SENSITIVE Sensitive     TRIMETH/SULFA <=20 SENSITIVE Sensitive     AMPICILLIN/SULBACTAM <=2 SENSITIVE Sensitive     PIP/TAZO <=4 SENSITIVE Sensitive     * FEW PROTEUS MIRABILIS  Aerobic/Anaerobic Culture w Gram Stain (surgical/deep wound)     Status: None   Collection Time: 03/28/23 10:59 AM   Specimen: Path Tissue  Result Value Ref Range Status   Specimen Description   Final    TISSUE Performed at [facility], [address], [place], [place] [address]    Special Requests SWAB OF LEFT LEG STUMP  Final   Gram Stain NO WBC SEEN NO ORGANISMS SEEN   Final   Culture   Final    RARE  STAPHYLOCOCCUS AUREUS RARE PROTEUS MIRABILIS NO ANAEROBES ISOLATED Performed at [facility], [address], [place], [place] [address]    Report Status 04/02/2023 FINAL  Final   Organism ID, Bacteria STAPHYLOCOCCUS AUREUS  Final   Organism ID, Bacteria PROTEUS MIRABILIS  Final      Susceptibility   Proteus mirabilis - MIC*    AMPICILLIN <=2 SENSITIVE Sensitive     CEFEPIME <=0.12 SENSITIVE Sensitive     CEFTAZIDIME <=1 SENSITIVE Sensitive     CEFTRIAXONE <=0.25 SENSITIVE Sensitive     CIPROFLOXACIN <=0.25 SENSITIVE Sensitive     GENTAMICIN <=1 SENSITIVE Sensitive     IMIPENEM 2 SENSITIVE Sensitive     TRIMETH/SULFA <=20 SENSITIVE Sensitive     AMPICILLIN/SULBACTAM <=2 SENSITIVE Sensitive     PIP/TAZO <=4 SENSITIVE  Sensitive     * RARE PROTEUS MIRABILIS   Staphylococcus aureus - MIC*    CIPROFLOXACIN >=8 RESISTANT Resistant     ERYTHROMYCIN >=8 RESISTANT Resistant     GENTAMICIN <=0.5 SENSITIVE Sensitive     OXACILLIN >=4 RESISTANT Resistant     TETRACYCLINE >=16 RESISTANT Resistant     VANCOMYCIN 1 SENSITIVE Sensitive     TRIMETH/SULFA <=10 SENSITIVE Sensitive     CLINDAMYCIN >=8 RESISTANT Resistant     RIFAMPIN <=0.5 SENSITIVE Sensitive     Inducible Clindamycin NEGATIVE Sensitive     LINEZOLID 2 SENSITIVE Sensitive     * RARE STAPHYLOCOCCUS AUREUS    Labs: CBC: Recent Labs  Lab 04/11/23 0515 04/13/23 0628 04/16/23 0334  WBC 5.2 5.7 5.5  NEUTROABS 2.7  --   --   HGB 8.2* 8.4* 8.7*  HCT 27.4* 28.3* 29.2*  MCV 89.8 89.8 90.1  PLT 307 296 283   Basic Metabolic Panel: Recent Labs  Lab 04/11/23 0515 04/13/23 0628 04/16/23 0334  NA 137 136 138  K 4.8 4.5 4.2  CL 103 101 105  CO2 26 25 27   GLUCOSE 100* 93 94  BUN 20 18 12   CREATININE 0.54 0.61 0.46  CALCIUM 8.5* 8.7* 8.6*   Liver Function Tests: Recent Labs  Lab 04/17/23 0527  AST 10*  ALT 10  ALKPHOS 93  BILITOT 0.2*  PROT 6.3*  ALBUMIN 2.6*   CBG: No results for input(s): "GLUCAP" in the last 168 hours.  Discharge time spent: greater than 30 minutes.  Signed: Alford Highland, MD [facility] 04/17/2023

## 2023-04-17 NOTE — H&P (View-Only) (Signed)
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 She had had multiple trips to the operating room for wound vac changes. Kerecis biologic was placed in the wound with each change. Patient is recovered and will be discharged to home with wound vac in place. She will be returning as outpatient next Monday to the OR for another wound vac change. Patient has her personal wound vac with her that she will be connected to in order to be discharged later today.    Vitals:   04/17/23 0204 04/17/23 0819  BP: (!) 140/71 92/73  Pulse: 75 75  Resp:  16  Temp:    SpO2: 97% 94%   Physical Exam: Cardiac:  HX Atrial fibrillation rate in the 90's Lungs:  Clear on auscultation throughout.  Incisions:  Left stump incision dressed with wound vac. Working well Extremities:  Bilateral lower extremity AKA's. Left with infection and now wound vac  Abdomen:  Positive bowel sounds. Right lower quadrant hernia.  Neurologic: AAOX3 follows commands.    CBC    Component Value Date/Time   WBC 5.5 04/16/2023 0334   RBC 3.24 (L) 04/16/2023 0334   HGB 8.7 (L) 04/16/2023 0334   HGB 13.4 02/12/2015 1137   HCT 29.2 (L) 04/16/2023 0334   HCT 40.9 02/12/2015 1137   PLT 283 04/16/2023 0334   PLT 272 02/12/2015 1137   MCV 90.1 04/16/2023 0334   MCV 90 02/12/2015 1137   MCH 26.9 04/16/2023 0334   MCHC 29.8 (L) 04/16/2023 0334   RDW 17.2 (H) 04/16/2023 0334   RDW 14.8 (H) 02/12/2015 1137   LYMPHSABS 1.6 04/11/2023 0515   LYMPHSABS 1.7 08/27/2014 1145   MONOABS 0.4 04/11/2023 0515   MONOABS 0.4 08/27/2014 1145   EOSABS 0.4 04/11/2023 0515   EOSABS 0.0 08/27/2014 1145   BASOSABS 0.0 04/11/2023 0515   BASOSABS 0.0 08/27/2014 1145    BMET    Component Value Date/Time   NA 138 04/16/2023 0334   NA 140 02/12/2015 1137   K 4.2  04/16/2023 0334   K 2.9 (L) 02/12/2015 1137   CL 105 04/16/2023 0334   CL 107 02/12/2015 1137   CO2 27 04/16/2023 0334   CO2 27 02/12/2015 1137   GLUCOSE 94 04/16/2023 0334   GLUCOSE 120 (H) 02/12/2015 1137   BUN 12 04/16/2023 0334   BUN 15 02/12/2015 1137   CREATININE 0.46 04/16/2023 0334   CREATININE 0.78 02/12/2015 1137   CALCIUM 8.6 (L) 04/16/2023 0334   CALCIUM 9.0 02/12/2015 1137   GFRNONAA >60 04/16/2023 0334   GFRNONAA >60 02/12/2015 1137   GFRAA >60 11/23/2019 1330   GFRAA >60 02/12/2015 1137    INR    Component Value Date/Time   INR 1.2 04/09/2023 0838   INR 0.8 07/23/2012 0433     Intake/Output Summary (Last 24 hours) at 04/17/2023 1107 Last data filed at 04/17/2023 1038 Gross per 24 hour  Intake 720 ml  Output 551 ml  Net 169 ml     Assessment/Plan:  62 y.o. female is s/p  Left Lower extremity AKA wound washout with Vac re-application and Kerecis biologic placed  1 Day Post-Op   PLAN: Patient to be discharged home later today connected to her personal home wound vac.  Patient will be discharged home on oral antibiotics via Infectious Disease.   Patient needs to take as prescribed.  Patient will return to [facility] Pre[facility] next Monday morning at 6:30 am for wound Vac change as outpatient to be discharged home later the same day.   DVT prophylaxis:  Eliquis 5 mg twice daily.    [name] R [facility] 04/17/2023 11:07 AM   

## 2023-04-17 NOTE — Care Management Important Message (Signed)
Important Message  Patient Details  Name: Carla [name] MRN: [id number] Date of Birth: 09/15/1961   Medicare Important Message Given:  Yes     Olegario Messier A Nia Nathaniel 04/17/2023, 10:48 AM

## 2023-04-17 NOTE — Progress Notes (Signed)
Received MD order to discharge patient to home.  I reviewed  discharge  instructions, home meds, prescriptions and follow up appointments  with patient and patient verbalized understanding.    °

## 2023-04-17 NOTE — Progress Notes (Signed)
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 She had had multiple trips to the operating room for wound vac changes. Kerecis biologic was placed in the wound with each change. Patient is recovered and will be discharged to home with wound vac in place. She will be returning as outpatient next Monday to the OR for another wound vac change. Patient has her personal wound vac with her that she will be connected to in order to be discharged later today.    Vitals:   04/17/23 0204 04/17/23 0819  BP: (!) 140/71 92/73  Pulse: 75 75  Resp:  16  Temp:    SpO2: 97% 94%   Physical Exam: Cardiac:  HX Atrial fibrillation rate in the 90's Lungs:  Clear on auscultation throughout.  Incisions:  Left stump incision dressed with wound vac. Working well Extremities:  Bilateral lower extremity AKA's. Left with infection and now wound vac  Abdomen:  Positive bowel sounds. Right lower quadrant hernia.  Neurologic: AAOX3 follows commands.    CBC    Component Value Date/Time   WBC 5.5 04/16/2023 0334   RBC 3.24 (L) 04/16/2023 0334   HGB 8.7 (L) 04/16/2023 0334   HGB 13.4 02/12/2015 1137   HCT 29.2 (L) 04/16/2023 0334   HCT 40.9 02/12/2015 1137   PLT 283 04/16/2023 0334   PLT 272 02/12/2015 1137   MCV 90.1 04/16/2023 0334   MCV 90 02/12/2015 1137   MCH 26.9 04/16/2023 0334   MCHC 29.8 (L) 04/16/2023 0334   RDW 17.2 (H) 04/16/2023 0334   RDW 14.8 (H) 02/12/2015 1137   LYMPHSABS 1.6 04/11/2023 0515   LYMPHSABS 1.7 08/27/2014 1145   MONOABS 0.4 04/11/2023 0515   MONOABS 0.4 08/27/2014 1145   EOSABS 0.4 04/11/2023 0515   EOSABS 0.0 08/27/2014 1145   BASOSABS 0.0 04/11/2023 0515   BASOSABS 0.0 08/27/2014 1145    BMET    Component Value Date/Time   NA 138 04/16/2023 0334   NA 140 02/12/2015 1137   K 4.2  04/16/2023 0334   K 2.9 (L) 02/12/2015 1137   CL 105 04/16/2023 0334   CL 107 02/12/2015 1137   CO2 27 04/16/2023 0334   CO2 27 02/12/2015 1137   GLUCOSE 94 04/16/2023 0334   GLUCOSE 120 (H) 02/12/2015 1137   BUN 12 04/16/2023 0334   BUN 15 02/12/2015 1137   CREATININE 0.46 04/16/2023 0334   CREATININE 0.78 02/12/2015 1137   CALCIUM 8.6 (L) 04/16/2023 0334   CALCIUM 9.0 02/12/2015 1137   GFRNONAA >60 04/16/2023 0334   GFRNONAA >60 02/12/2015 1137   GFRAA >60 11/23/2019 1330   GFRAA >60 02/12/2015 1137    INR    Component Value Date/Time   INR 1.2 04/09/2023 0838   INR 0.8 07/23/2012 0433     Intake/Output Summary (Last 24 hours) at 04/17/2023 1107 Last data filed at 04/17/2023 1038 Gross per 24 hour  Intake 720 ml  Output 551 ml  Net 169 ml     Assessment/Plan:  62 y.o. female is s/p  Left Lower extremity AKA wound washout with Vac re-application and Kerecis biologic placed  1 Day Post-Op   PLAN: Patient to be discharged home later today connected to her personal home wound vac.  Patient will be discharged home on oral antibiotics via Infectious Disease.   Patient needs to take as prescribed.  Patient will return to [facility] Pre-op next Monday morning at 6:30 am for wound Vac change as outpatient to be discharged home later the same day.   DVT prophylaxis:  Eliquis 5 mg twice daily.    Kaylin Marcon R Renesmae [facility] 04/17/2023 11:07 AM   

## 2023-04-17 NOTE — Consult Note (Addendum)
WOC consult made by primary MD to switch patient over to home NPWT device for impending discharge today.   This plan was verified by vascular surgery PA note as well as secure chat to TOC.    Patient has home NPWT device with new cannister in room.  This RN did clamp hospital NPWT device and switch existing NPWT dressing over to home NPWT device.  Immediate seal at 125 mm hg.  Dressing was not changed or altered in any way.  Home NPWT device plugged in to charge device as it was completely dead when arrived to room.  Did discuss above with patient and bedside nurse.  Plan is for patient to DC home later this afternoon.    Thank you,    Priscella Mann MSN, RN-BC, [employer] [phone]

## 2023-04-17 NOTE — TOC Transition Note (Addendum)
Transition of Care [facility]) - CM/SW Discharge Note   Patient Details  Name: Carla [name] MRN: [id number] Date of Birth: 05-05-1961  Transition of Care [facility]) CM/SW Contact:  Allena Katz, LCSW Phone Number: 04/17/2023, 9:37 AM   Clinical Narrative:  Pt to discharge home today with home vac through [facility]. CSW unable to secure [facility] at this time. Per vascular patient will go to OR on Mondays for Vac changes as she is unable to tolerate them in the office at this time. Vascular reports after changes they will set up [facility] if needed after. Vascular has confirmed that vac dimensions are the same.  Pt uses medicaid transport and will utilize this for appointments. No additional needs at this time.     Final next level of care: Home/Self Care Barriers to Discharge: Barriers Resolved   Patient Goals and CMS Choice CMS Medicare.gov Compare Post Acute Care list provided to:: Patient Choice offered to / list presented to : NA  Discharge Placement                  Patient to be transferred to facility by: daughter      Discharge Plan and Services Additional resources added to the After Visit Summary for       Post Acute Care Choice: Resumption of Svcs/PTA Provider                    HH Arranged: RN, PT, OT [facility] Agency: [facility] Date [facility] Agency Contacted: 03/22/23   Representative spoke with at [facility] Agency: Feliberto Gottron  Social Determinants of Health (SDOH) Interventions SDOH Screenings   Food Insecurity: No Food Insecurity (03/22/2023)  Housing: Low Risk  (03/22/2023)  Transportation Needs: No Transportation Needs (03/22/2023)  Utilities: Not At Risk (03/22/2023)  Depression (PHQ2-9): Low Risk  (01/25/2023)  Tobacco Use: Medium Risk (04/17/2023)     Readmission Risk Interventions    03/23/2023   12:17 PM 12/06/2022    1:31 PM 07/25/2022   10:49 AM  Readmission Risk Prevention Plan  Transportation Screening Complete Complete Complete  Medication Review Furniture conservator/restorer) Complete Complete Complete  PCP or Specialist appointment within 3-5 days of discharge Complete  Complete  HRI or Home Care Consult Complete  Complete  SW Recovery Care/Counseling Consult Complete Complete Not Complete  SW Consult Not Complete Comments   NA  Palliative Care Screening Not Applicable Not Applicable   Skilled Nursing Facility Not Applicable  Patient Refused

## 2023-04-19 ENCOUNTER — Other Ambulatory Visit: Payer: Self-pay

## 2023-04-19 ENCOUNTER — Inpatient Hospital Stay: Admission: RE | Admit: 2023-04-19 | Payer: 59 | Source: Ambulatory Visit

## 2023-04-19 ENCOUNTER — Telehealth (INDEPENDENT_AMBULATORY_CARE_PROVIDER_SITE_OTHER): Payer: Self-pay

## 2023-04-19 NOTE — Telephone Encounter (Signed)
Does she have home health? Can her family try to reinforce the dressings?

## 2023-04-19 NOTE — Telephone Encounter (Signed)
Patient reach out informing that her wound vac is not staying on and no drainage going down tubes.The machine registering an air leak. Patient stated that this started after being discharge from the hospital. Please Advise

## 2023-04-19 NOTE — Telephone Encounter (Signed)
Patient does not have home health at this time. Patient will speak with her daughter to see if she can try to reinforce the dressings and will contact the office if there are no changes.

## 2023-04-20 ENCOUNTER — Other Ambulatory Visit: Payer: Self-pay

## 2023-04-20 ENCOUNTER — Emergency Department
Admission: EM | Admit: 2023-04-20 | Discharge: 2023-04-20 | Disposition: A | Discharge status: Home / Self Care | Payer: 59 | Attending: Emergency Medicine | Admitting: Emergency Medicine

## 2023-04-20 DIAGNOSIS — T85698A Other mechanical complication of other specified internal prosthetic devices, implants and grafts, initial encounter: Secondary | ICD-10-CM | POA: Diagnosis present

## 2023-04-20 DIAGNOSIS — I509 Heart failure, unspecified: Secondary | ICD-10-CM | POA: Diagnosis not present

## 2023-04-20 DIAGNOSIS — Y828 Other medical devices associated with adverse incidents: Secondary | ICD-10-CM | POA: Insufficient documentation

## 2023-04-20 DIAGNOSIS — I110 Hypertensive heart disease with heart failure: Secondary | ICD-10-CM | POA: Diagnosis not present

## 2023-04-20 DIAGNOSIS — Z4689 Encounter for fitting and adjustment of other specified devices: Secondary | ICD-10-CM

## 2023-04-20 MED ORDER — OXYCODONE HCL 5 MG PO TABS
15.0000 mg | ORAL_TABLET | ORAL | Status: AC
  Administered 2023-04-20: 15 mg via ORAL
  Filled 2023-04-20: qty 3

## 2023-04-20 NOTE — ED Triage Notes (Signed)
Pt to ed from home via [facility] for wound vac complications. Pt wound vac was placed on Tuesday this week. Pt called her on call physician and they told her to go to ER. Pt has apt Monday. Pt is CAOx4, in no acute distress in triage.

## 2023-04-20 NOTE — ED Notes (Signed)
Aundra Millet, Consulting civil engineer at bedside attempting to facilitate transportation home for pt.

## 2023-04-20 NOTE — ED Notes (Signed)
[facility] called for transport to pt's home  

## 2023-04-20 NOTE — Telephone Encounter (Signed)
Patient left a message stating that the wound vac machine is reading a blockage. Patient daughter tried to reinforce vac but no changes. Patient was advise to go to the ED for further evaluation per [facility] NP. Patient was made aware and I left a message on patient daughter voicemail.Carla [name]

## 2023-04-20 NOTE — ED Provider Notes (Signed)
[facility] Provider Note    Event Date/Time   First MD Initiated Contact with Patient 04/20/23 2115     (approximate)   History   Post-op Problem   HPI  Carla [name] is a 62 y.o. female with a history of hypertension CHF morbid obesity prior AKI placement having regular wound VAC changes on her AKA surgical stump who comes ED complaining of wound VAC pump not functioning since yesterday.  She reports that it started alarming and she was not able to get it restarted.  Denies chest pain shortness of breath or fevers.  No increase of leg pain.  Reviewed outside records including operative note from April 16, 2023 wound VAC and skin graft change from Dr. Wyn Quaker.  Had some necrotic tissue debrided but overall uncomplicated procedure.     Physical Exam   Triage Vital Signs: ED Triage Vitals  Enc Vitals Group     BP 04/20/23 1900 111/76     Pulse Rate 04/20/23 1900 76     Resp 04/20/23 1900 16     Temp 04/20/23 1900 97.7 F (36.5 C)     Temp Source 04/20/23 1900 Oral     SpO2 04/20/23 1900 95 %     Weight 04/20/23 1858 176 lb 5.9 oz (80 kg)     Height 04/20/23 1858 5\' 2"  (1.575 m)     Head Circumference --      Peak Flow --      Pain Score 04/20/23 1858 0     Pain Loc --      Pain Edu? --      Excl. in GC? --     Most recent vital signs: Vitals:   04/20/23 1900 04/20/23 2136  BP: 111/76 101/76  Pulse: 76 73  Resp: 16 18  Temp: 97.7 F (36.5 C)   SpO2: 95% 92%    General: Awake, no distress.  CV:  Good peripheral perfusion.  Resp:  Normal effort.  Abd:  No distention.  Other:  AKA stump with intact dressing and seal.  No inflammatory changes or edema.   ED Results / Procedures / Treatments   Labs (all labs ordered are listed, but only abnormal results are displayed) Labs Reviewed - No data to display   RADIOLOGY    PROCEDURES:  Procedures   MEDICATIONS ORDERED IN ED: Medications  oxyCODONE (Oxy IR/ROXICODONE) immediate  release tablet 15 mg (15 mg Oral Given 04/20/23 2138)     IMPRESSION / MDM / ASSESSMENT AND PLAN / ED COURSE  I reviewed the triage vital signs and the nursing notes.                             Patient presents with wound VAC dysfunction at home.  Dressing and seal appear to be intact.  Tubing is not clamped or damaged.  VAC was reset and restarted, and operated successfully with negative pressure up to 125.  She was observed in the ED for several minutes and back continued working continuously.  No signs of surgical complication.  She is scheduled for interval VAC change in 4 days.  She stable for discharge home.      FINAL CLINICAL IMPRESSION(S) / ED DIAGNOSES   Final diagnoses:  Encounter for management of wound VAC     Rx / DC Orders   ED Discharge Orders     None        Note:  This document was prepared using Dragon voice recognition software and may include unintentional dictation errors.   [name], MD 04/20/23 2146

## 2023-04-20 NOTE — ED Notes (Signed)
[facility] contacted for transport back to residence

## 2023-04-23 ENCOUNTER — Ambulatory Visit
Admission: RE | Admit: 2023-04-23 | Discharge: 2023-04-23 | Disposition: A | Discharge status: Home / Self Care | Payer: 59 | Attending: Vascular Surgery | Admitting: Vascular Surgery

## 2023-04-23 ENCOUNTER — Other Ambulatory Visit: Payer: Self-pay

## 2023-04-23 ENCOUNTER — Encounter
Admission: RE | Disposition: A | Discharge status: Home / Self Care | Payer: Self-pay | Source: Home / Self Care | Attending: Vascular Surgery

## 2023-04-23 ENCOUNTER — Ambulatory Visit: Payer: 59 | Admitting: Anesthesiology

## 2023-04-23 DIAGNOSIS — F32A Depression, unspecified: Secondary | ICD-10-CM | POA: Diagnosis not present

## 2023-04-23 DIAGNOSIS — I25119 Atherosclerotic heart disease of native coronary artery with unspecified angina pectoris: Secondary | ICD-10-CM | POA: Diagnosis not present

## 2023-04-23 DIAGNOSIS — I700 Atherosclerosis of aorta: Secondary | ICD-10-CM | POA: Insufficient documentation

## 2023-04-23 DIAGNOSIS — I480 Paroxysmal atrial fibrillation: Secondary | ICD-10-CM | POA: Diagnosis not present

## 2023-04-23 DIAGNOSIS — G8929 Other chronic pain: Secondary | ICD-10-CM | POA: Insufficient documentation

## 2023-04-23 DIAGNOSIS — Z6841 Body Mass Index (BMI) 40.0 and over, adult: Secondary | ICD-10-CM | POA: Insufficient documentation

## 2023-04-23 DIAGNOSIS — Z87891 Personal history of nicotine dependence: Secondary | ICD-10-CM | POA: Insufficient documentation

## 2023-04-23 DIAGNOSIS — X58XXXA Exposure to other specified factors, initial encounter: Secondary | ICD-10-CM | POA: Diagnosis not present

## 2023-04-23 DIAGNOSIS — L97821 Non-pressure chronic ulcer of other part of left lower leg limited to breakdown of skin: Secondary | ICD-10-CM | POA: Diagnosis not present

## 2023-04-23 DIAGNOSIS — I5032 Chronic diastolic (congestive) heart failure: Secondary | ICD-10-CM | POA: Diagnosis not present

## 2023-04-23 DIAGNOSIS — E782 Mixed hyperlipidemia: Secondary | ICD-10-CM | POA: Diagnosis not present

## 2023-04-23 DIAGNOSIS — I739 Peripheral vascular disease, unspecified: Secondary | ICD-10-CM | POA: Insufficient documentation

## 2023-04-23 DIAGNOSIS — K219 Gastro-esophageal reflux disease without esophagitis: Secondary | ICD-10-CM | POA: Diagnosis not present

## 2023-04-23 DIAGNOSIS — T8789 Other complications of amputation stump: Secondary | ICD-10-CM | POA: Diagnosis not present

## 2023-04-23 DIAGNOSIS — D649 Anemia, unspecified: Secondary | ICD-10-CM | POA: Diagnosis not present

## 2023-04-23 DIAGNOSIS — I110 Hypertensive heart disease with heart failure: Secondary | ICD-10-CM | POA: Diagnosis not present

## 2023-04-23 DIAGNOSIS — J449 Chronic obstructive pulmonary disease, unspecified: Secondary | ICD-10-CM | POA: Insufficient documentation

## 2023-04-23 DIAGNOSIS — Z89612 Acquired absence of left leg above knee: Secondary | ICD-10-CM | POA: Diagnosis not present

## 2023-04-23 DIAGNOSIS — F419 Anxiety disorder, unspecified: Secondary | ICD-10-CM | POA: Insufficient documentation

## 2023-04-23 HISTORY — PX: APPLICATION OF WOUND VAC: SHX5189

## 2023-04-23 SURGERY — APPLICATION, WOUND VAC
Anesthesia: General | Site: Leg Lower | Laterality: Left

## 2023-04-23 MED ORDER — OXYCODONE HCL 5 MG PO TABS
ORAL_TABLET | ORAL | Status: AC
  Filled 2023-04-23: qty 1

## 2023-04-23 MED ORDER — DIPHENHYDRAMINE HCL 50 MG/ML IJ SOLN: 50.0000 mg | Freq: Once | INTRAMUSCULAR | Status: DC | PRN

## 2023-04-23 MED ORDER — FAMOTIDINE 20 MG PO TABS
ORAL_TABLET | ORAL | Status: AC
  Filled 2023-04-23: qty 2

## 2023-04-23 MED ORDER — PENTAFLUOROPROP-TETRAFLUOROETH EX AERO
INHALATION_SPRAY | CUTANEOUS | Status: AC
  Filled 2023-04-23: qty 30

## 2023-04-23 MED ORDER — DIPHENHYDRAMINE HCL 50 MG/ML IJ SOLN
INTRAMUSCULAR | Status: AC
  Filled 2023-04-23: qty 1

## 2023-04-23 MED ORDER — CHLORHEXIDINE GLUCONATE 4 % EX SOLN: 1.0000 | Freq: Once | CUTANEOUS | Status: DC

## 2023-04-23 MED ORDER — CEFAZOLIN SODIUM-DEXTROSE 2-4 GM/100ML-% IV SOLN
INTRAVENOUS | Status: AC
  Filled 2023-04-23: qty 100

## 2023-04-23 MED ORDER — ONDANSETRON HCL 4 MG/2ML IJ SOLN: 4.0000 mg | Freq: Four times a day (QID) | INTRAMUSCULAR | Status: DC | PRN

## 2023-04-23 MED ORDER — MIDAZOLAM HCL 2 MG/2ML IJ SOLN
INTRAMUSCULAR | Status: AC
  Filled 2023-04-23: qty 2

## 2023-04-23 MED ORDER — CHLORHEXIDINE GLUCONATE 0.12 % MT SOLN
OROMUCOSAL | Status: AC
  Filled 2023-04-23: qty 15

## 2023-04-23 MED ORDER — CEFAZOLIN SODIUM-DEXTROSE 2-4 GM/100ML-% IV SOLN
2.0000 g | INTRAVENOUS | Status: AC
  Administered 2023-04-23: 2 g via INTRAVENOUS

## 2023-04-23 MED ORDER — LIDOCAINE HCL (CARDIAC) PF 100 MG/5ML IV SOSY
PREFILLED_SYRINGE | INTRAVENOUS | Status: DC | PRN
  Administered 2023-04-23: 50 mg via INTRAVENOUS

## 2023-04-23 MED ORDER — PROPOFOL 1000 MG/100ML IV EMUL
INTRAVENOUS | Status: AC
  Filled 2023-04-23: qty 100

## 2023-04-23 MED ORDER — MIDAZOLAM HCL 2 MG/2ML IJ SOLN
INTRAMUSCULAR | Status: DC | PRN
  Administered 2023-04-23: 1 mg via INTRAVENOUS

## 2023-04-23 MED ORDER — EPHEDRINE SULFATE (PRESSORS) 50 MG/ML IJ SOLN
INTRAMUSCULAR | Status: DC | PRN
  Administered 2023-04-23: 10 mg via INTRAVENOUS

## 2023-04-23 MED ORDER — FAMOTIDINE 20 MG PO TABS: 40.0000 mg | ORAL_TABLET | Freq: Once | ORAL | Status: DC | PRN

## 2023-04-23 MED ORDER — ACETAMINOPHEN 10 MG/ML IV SOLN: 1000.0000 mg | Freq: Once | INTRAVENOUS | Status: DC | PRN

## 2023-04-23 MED ORDER — FENTANYL CITRATE (PF) 100 MCG/2ML IJ SOLN
INTRAMUSCULAR | Status: AC
  Filled 2023-04-23: qty 2

## 2023-04-23 MED ORDER — ACETAMINOPHEN 10 MG/ML IV SOLN
INTRAVENOUS | Status: AC
  Filled 2023-04-23: qty 100

## 2023-04-23 MED ORDER — FENTANYL CITRATE (PF) 100 MCG/2ML IJ SOLN
INTRAMUSCULAR | Status: DC | PRN
  Administered 2023-04-23 (×2): 50 ug via INTRAVENOUS

## 2023-04-23 MED ORDER — PROPOFOL 10 MG/ML IV BOLUS
INTRAVENOUS | Status: DC | PRN
  Administered 2023-04-23: 30 mg via INTRAVENOUS

## 2023-04-23 MED ORDER — KETAMINE HCL 50 MG/5ML IJ SOSY
PREFILLED_SYRINGE | INTRAMUSCULAR | Status: AC
  Filled 2023-04-23: qty 5

## 2023-04-23 MED ORDER — LIDOCAINE HCL (PF) 2 % IJ SOLN
INTRAMUSCULAR | Status: AC
  Filled 2023-04-23: qty 5

## 2023-04-23 MED ORDER — METHYLPREDNISOLONE SODIUM SUCC 125 MG IJ SOLR: 125.0000 mg | Freq: Once | INTRAMUSCULAR | Status: DC | PRN

## 2023-04-23 MED ORDER — DEXMEDETOMIDINE HCL IN NACL 80 MCG/20ML IV SOLN
INTRAVENOUS | Status: DC | PRN
  Administered 2023-04-23: 20 ug via INTRAVENOUS

## 2023-04-23 MED ORDER — ACETAMINOPHEN 10 MG/ML IV SOLN
INTRAVENOUS | Status: DC | PRN
  Administered 2023-04-23: 1000 mg via INTRAVENOUS

## 2023-04-23 MED ORDER — MIDAZOLAM HCL 2 MG/ML PO SYRP
8.0000 mg | ORAL_SOLUTION | Freq: Once | ORAL | Status: AC | PRN
  Administered 2023-04-23: 8 mg via ORAL

## 2023-04-23 MED ORDER — PROPOFOL 500 MG/50ML IV EMUL
INTRAVENOUS | Status: DC | PRN
  Administered 2023-04-23: 100 ug/kg/min via INTRAVENOUS

## 2023-04-23 MED ORDER — LIDOCAINE HCL (PF) 1 % IJ SOLN
INTRAMUSCULAR | Status: AC
  Filled 2023-04-23: qty 2

## 2023-04-23 MED ORDER — EPHEDRINE 5 MG/ML INJ
INTRAVENOUS | Status: AC
  Filled 2023-04-23: qty 5

## 2023-04-23 MED ORDER — FENTANYL CITRATE PF 50 MCG/ML IJ SOSY: 12.5000 ug | PREFILLED_SYRINGE | Freq: Once | INTRAMUSCULAR | Status: DC | PRN

## 2023-04-23 MED ORDER — FENTANYL CITRATE (PF) 100 MCG/2ML IJ SOLN
25.0000 ug | INTRAMUSCULAR | Status: DC | PRN
  Administered 2023-04-23: 50 ug via INTRAVENOUS
  Administered 2023-04-23 (×2): 25 ug via INTRAVENOUS
  Administered 2023-04-23: 50 ug via INTRAVENOUS

## 2023-04-23 MED ORDER — MIDAZOLAM HCL 2 MG/ML PO SYRP
ORAL_SOLUTION | ORAL | Status: AC
  Filled 2023-04-23: qty 5

## 2023-04-23 MED ORDER — KETAMINE HCL 10 MG/ML IJ SOLN
INTRAMUSCULAR | Status: DC | PRN
  Administered 2023-04-23: 30 mg via INTRAVENOUS
  Administered 2023-04-23: 20 mg via INTRAVENOUS

## 2023-04-23 MED ORDER — HYDROMORPHONE HCL 1 MG/ML IJ SOLN: 1.0000 mg | Freq: Once | INTRAMUSCULAR | Status: DC | PRN

## 2023-04-23 MED ORDER — ONDANSETRON HCL 4 MG/2ML IJ SOLN: 4.0000 mg | Freq: Once | INTRAMUSCULAR | Status: DC | PRN

## 2023-04-23 MED ORDER — OXYCODONE HCL 5 MG/5ML PO SOLN: 5.0000 mg | Freq: Once | ORAL | Status: AC | PRN

## 2023-04-23 MED ORDER — OXYCODONE HCL 5 MG PO TABS
5.0000 mg | ORAL_TABLET | Freq: Once | ORAL | Status: AC | PRN
  Administered 2023-04-23: 5 mg via ORAL

## 2023-04-23 MED ORDER — SODIUM CHLORIDE 0.9 % IV SOLN: INTRAVENOUS | Status: DC

## 2023-04-23 MED ORDER — LACTATED RINGERS IV SOLN: INTRAVENOUS | Status: DC

## 2023-04-23 SURGICAL SUPPLY — 22 items
DRAPE INCISE IOBAN 66X45 STRL (DRAPES) IMPLANT
ELECT REM PT RETURN 9FT ADLT (ELECTROSURGICAL) ×1
ELECTRODE REM PT RTRN 9FT ADLT (ELECTROSURGICAL) ×1 IMPLANT
GAUZE SPONGE 4X4 12PLY STRL (GAUZE/BANDAGES/DRESSINGS) IMPLANT
GLOVE BIO SURGEON STRL SZ7 (GLOVE) ×1 IMPLANT
GLOVE SURG SYN 8.0 (GLOVE) ×1 IMPLANT
GLOVE SURG SYN 8.0 PF PI (GLOVE) ×1 IMPLANT
GOWN STRL REUS W/ TWL LRG LVL3 (GOWN DISPOSABLE) ×2 IMPLANT
GOWN STRL REUS W/ TWL XL LVL3 (GOWN DISPOSABLE) ×1 IMPLANT
GOWN STRL REUS W/TWL LRG LVL3 (GOWN DISPOSABLE) ×2
GOWN STRL REUS W/TWL XL LVL3 (GOWN DISPOSABLE) ×1
GRAFT SKIN WND MICRO 38 (Tissue) IMPLANT
KIT TURNOVER KIT A (KITS) ×1 IMPLANT
LABEL OR SOLS (LABEL) ×1 IMPLANT
MANIFOLD NEPTUNE II (INSTRUMENTS) ×1 IMPLANT
NS IRRIG 1000ML POUR BTL (IV SOLUTION) IMPLANT
NS IRRIG 500ML POUR BTL (IV SOLUTION) ×1 IMPLANT
PACK EXTREMITY ARMC (MISCELLANEOUS) ×1 IMPLANT
PAD PREP OB/GYN DISP 24X41 (PERSONAL CARE ITEMS) ×1 IMPLANT
SOL PREP PVP 2OZ (MISCELLANEOUS) ×1
SOLUTION PREP PVP 2OZ (MISCELLANEOUS) ×1 IMPLANT
TRAP FLUID SMOKE EVACUATOR (MISCELLANEOUS) ×1 IMPLANT

## 2023-04-23 NOTE — Op Note (Signed)
    OPERATIVE NOTE   PROCEDURE: Wound VAC placement left above-knee amputation wound  PRE-OPERATIVE DIAGNOSIS: Left above-knee amputation wound  POST-OPERATIVE DIAGNOSIS: Same  SURGEON: Levora Dredge  ASSISTANT(S): Rolla Plate, NP  ANESTHESIA: MAC  ESTIMATED BLOOD LOSS: <5 cc  FINDING(S): Wound bed appears richly granulated and contracting  SPECIMEN(S): None  INDICATIONS:   Carla [name] is a 62 y.o. female who presents with ulceration of her left above-knee amputation.  It has been slowly granulating and she has been undergoing serial VAC changes.  She presents today for a follow-up wound assessment and change of the VAC.  Risks and benefits were reviewed all questions answered patient agrees to proceed..  DESCRIPTION: After full informed written consent was obtained from the patient, the patient was brought back to the operating room and placed supine upon the operating table.  Prior to induction, the patient received IV antibiotics.   After obtaining adequate anesthesia, the patient was then prepped and draped in the standard fashion for a a VAC dressing change.  The previous dressing was removed.  The wound is inspected.  It is richly granulated.  It appears to be doing quite well.  The wound is measured it is 14 cm x 5 cm (70 cm) a black VAC sponge was then trimmed to the appropriate shape and a dressing applied.  Seal is in the green before she left the operating room.   The patient tolerated this procedure well.   COMPLICATIONS: None  CONDITION: Velna Hatchet [facility]  Office: [phone]   04/23/2023, 1:11 PM

## 2023-04-23 NOTE — Interval H&P Note (Signed)
History and Physical Interval Note:  04/23/2023 8:23 AM  Carla [name]  has presented today for surgery, with the diagnosis of infection.  The various methods of treatment have been discussed with the patient and family. After consideration of risks, benefits and other options for treatment, the patient has consented to  Procedure(s): LEFT STUMP WOUND VAC EXCHANGE (Left) as a surgical intervention.  The patient's history has been reviewed, patient examined, no change in status, stable for surgery.  I have reviewed the patient's chart and labs.  Questions were answered to the patient's satisfaction.     Levora Dredge

## 2023-04-23 NOTE — Anesthesia Preprocedure Evaluation (Signed)
Anesthesia Evaluation  Patient identified by MRN, date of birth, ID band Patient awake    Reviewed: Allergy & Precautions, NPO status , Patient's Chart, lab work & pertinent test results  History of Anesthesia Complications (+) DIFFICULT IV STICK / SPECIAL LINE and history of anesthetic complications (difficult IV stick requiring ultrasound placement)  Airway Mallampati: IV   Neck ROM: Full    Dental  (+) Edentulous Upper, Edentulous Lower   Pulmonary COPD, former smoker (quit 2023)   Pulmonary exam normal breath sounds clear to auscultation       Cardiovascular hypertension, + CAD, + Peripheral Vascular Disease (on Eliquis) and +CHF  Normal cardiovascular exam+ dysrhythmias (a fib)  Rhythm:Regular Rate:Normal  Echo 09/14/21:  NORMAL LEFT VENTRICULAR SYSTOLIC FUNCTION  NORMAL RIGHT VENTRICULAR SYSTOLIC FUNCTION  TRIVIAL REGURGITATION NOTED  NO VALVULAR STENOSIS  TRIVIAL MR, TR  EF >55%   Myocardial perfusion 10/05/20:  Abnormal echo perfusion scan evidence of global hypokinesis  ejection fraction between 25 to 30% there is significant defect in the  inferior region relatively persistent no clear evidence of redistribution  mildly enlarged left ventricular chamber     Neuro/Psych  PSYCHIATRIC DISORDERS Anxiety Depression    Chronic pain    GI/Hepatic ,GERD  ,,  Endo/Other  Obesity   Renal/GU      Musculoskeletal   Abdominal   Peds  Hematology  (+) Blood dyscrasia, anemia   Anesthesia Other Findings Cardiology note 03/08/22:  62 y.o. female with  1. Coronary artery disease of native artery of native heart with stable angina pectoris (CMS-HCC)  2. Aortic atherosclerosis (CMS-HCC)  3. Right-sided chest wall pain  4. PAF (paroxysmal atrial fibrillation) (CMS-HCC)  5. Chronic diastolic CHF (congestive heart failure) (CMS-HCC)  6. Primary hypertension  7. Mixed hyperlipidemia  8. Chronic obstructive pulmonary  disease, unspecified COPD type (CMS-HCC)  9. Tobacco use  10. Morbid obesity with BMI of 40.0-44.9, adult (CMS-HCC)  11. PAD (peripheral artery disease) (CMS-HCC)    1. CAD 2. Aortic atherosclerosis Fairly stable. The patient's previous symptoms of chest pain has resolved. Cardiac catheterization from 04/2021 was remarkable for a 50% ostial lesion and a 60 to 70% proximal RCA lesion, in addition to a LVEF of at least 55%; however, intervention was referred and medical therapy was recommended.  -Continue Imdur 60 mg by mouth once daily. -Continue: Aspirin, atorvastatin, metoprolol. -Continue sublingual nitroglycerin as needed for severe chest pain. - We will consider repeat LHC if symptoms of chest pain returns with worsening - -Counseling discussion included heart healthy lifestyle inclusive of avoidance of tobacco products, weight control, blood pressure and blood sugar control, and regular exercise regimen to included daily aerobic exercise for least 150 minutes per week, as tolerated, at a moderate intensity.  3. Right-sided chest pain Resolved. Previous right-sided chest pain likely secondary to recent traumatic fall. CT of the chest from 01/04/2022 revealed a trace right-sided pleural fluid with evidence suggesting some complexity and potential blood/hemothorax; however, there was no evidence of associated trauma such as fracture, pulmonary contusion or pulmonary hemorrhage at that time. She was seen in the ER on 01/18/2022 and 02/04/22 for chest pain symptoms and her ACS work-up was negative. -Recommend following up with pain clinic and establishing care with PCP.  4. Paroxysmal atrial fibrillation Reasonably controlled. Rate controlled at this time. Patient has a CHA2DS2-VASc score of 6. She denies having any palpitations at this time. Holter monitor was placed previously; however, the patient did not return the holter to receive the  results.  - Holter monitor results pending as we are  awaiting the full equipment return by patient.  -Continue anticoagulation with Eliquis for stroke risk reduction. -Beta-blocker as above for rate control. -Education reinforced to the patient on the importance of following bleeding precautions and to report any signs or symptoms of abnormal bleeding to our office. She verbalized understanding of all information.  5. Chronic diastolic CHF Compensated. Not exacerbated at this time. Repeat echocardiogram from 08/2021 revealed normal LV systolic function with normal RV systolic function and estimated EF of >55%. The patient appears to be euvolemic on today and her symptoms of BLE edema is likely secondary to her underlying PAD which required recent stent placements. Chronic dyspnea likely secondary to COPD.  -Beta-blocker as above. -Continue diuresis with furosemide. - -Recommend decreasing sodium intake, elevating the BLE and utilizing compression socks. -Education reinforced to the patient on the following precautions: Please restrict sodium. Aim for no more than 1.5 grams (1,500 mgs) per day. Read food labels for salt content in all foods. Weigh yourself daily and record. If your weight increases by greater than 2 lbs in one day or 5 lbs in one week, call the office at [phone]. Please exercise. Aim for walking 20 minutes, 5 days per week Focus on obtaining a healthy weight. Additional weight places significant stress on your heart.  6. Essential hypertension Chronic, reasonably controlled. -Beta-blocker as above. -Recommend following DASH diet and monitoring blood pressure daily at home.   7. Hyperlipidemia Poorly controlled. Most recent lipid profile reveals a total cholesterol = 262 (H), HDL = 48.2, triglyceride = 311 (H) and LDL = 152 (H) from 03/2021. -Statin therapy as above. -Education reinforced to the patient on the importance of medication compliance and how important statin therapy is in tertiary prevention and lowering of  cholesterol levels. She verbalized understanding of all information.  8. COPD 9. Tobacco use COPD reasonably controlled. The patient denies having any dyspnea at this time. O2 saturation on today is at 97% on room air. The patient has increased from 1/4 pack to 1 pack of cigarettes daily -Recommend tobacco cessation. -Recommend following up with pulmonary soon as possible.  10. Obesity, morbid -Lifestyle changes as recommended above.  11. PAD s/p left common iliac stent placement x2  - Management per vascular surgery. Patient scheduled for left extremity angiography.  Return in about 4 months (around 07/09/2022). Discussed return precautions with patient for acute and chronic cardiovascular issues.    Reproductive/Obstetrics                             Anesthesia Physical Anesthesia Plan  ASA: 4  Anesthesia Plan: General   Post-op Pain Management:    Induction: Intravenous  PONV Risk Score and Plan: 3 and Propofol infusion, TIVA, Treatment may vary due to age or medical condition and Ondansetron  Airway Management Planned: Natural Airway  Additional Equipment:   Intra-op Plan:   Post-operative Plan:   Informed Consent: I have reviewed the patients History and Physical, chart, labs and discussed the procedure including the risks, benefits and alternatives for the proposed anesthesia with the patient or authorized representative who has indicated his/her understanding and acceptance.       Plan Discussed with: CRNA  Anesthesia Plan Comments: (LMA/GETA backup discussed.  Patient consented for risks of anesthesia including but not limited to:  - adverse reactions to medications - damage to eyes, teeth, lips or other oral mucosa -  nerve damage due to positioning  - sore throat or hoarseness - damage to heart, brain, nerves, lungs, other parts of body or loss of life  Informed patient about role of CRNA in peri- and intra-operative care.  Patient  voiced understanding.)        Anesthesia Quick Evaluation

## 2023-04-23 NOTE — Transfer of Care (Signed)
Immediate Anesthesia Transfer of Care Note  Patient: Carla [name]  Procedure(s) Performed: LEFT STUMP WOUND VAC EXCHANGE (Left: Leg Lower)  Patient Location: PACU  Anesthesia Type:MAC  Level of Consciousness: sedated and responds to stimulation  Airway & Oxygen Therapy: Patient Spontanous Breathing and Patient connected to face mask oxygen  Post-op Assessment: Report given to RN and Post -op Vital signs reviewed and stable  Post vital signs: Reviewed and stable  Last Vitals:  Vitals Value Taken Time  BP 101/67 04/23/23 1015  Temp    Pulse 71 04/23/23 1015  Resp 12 04/23/23 1015  SpO2 99 % 04/23/23 1015  Vitals shown include unvalidated device data.  Last Pain:  Vitals:   04/23/23 0810  PainSc: 8          Complications: No notable events documented.

## 2023-04-23 NOTE — Discharge Instructions (Signed)

## 2023-04-23 NOTE — Anesthesia Postprocedure Evaluation (Signed)
Anesthesia Post Note  Patient: Carla [name]  Procedure(s) Performed: LEFT STUMP WOUND VAC EXCHANGE (Left: Leg Lower)  Patient location during evaluation: PACU Anesthesia Type: General Level of consciousness: awake and alert, oriented and patient cooperative Pain management: pain level controlled Vital Signs Assessment: post-procedure vital signs reviewed and stable Respiratory status: spontaneous breathing, nonlabored ventilation and respiratory function stable Cardiovascular status: blood pressure returned to baseline and stable Postop Assessment: adequate PO intake Anesthetic complications: no   No notable events documented.   Last Vitals:  Vitals:   04/23/23 1045 04/23/23 1100  BP: 122/67 (!) 131/95  Pulse: 67 70  Resp: 12 15  Temp:  (!) 36.2 C  SpO2: 100% 93%    Last Pain:  Vitals:   04/23/23 1100  PainSc: 10-Worst pain ever                 Reed Breech

## 2023-04-23 NOTE — Progress Notes (Signed)
Rolla Plate NP at bedside stated wound vac working appropriately. Wound vac working at continuous suction 125. Site is clean, dry, and intact.

## 2023-04-24 ENCOUNTER — Encounter: Payer: Self-pay | Admitting: Vascular Surgery

## 2023-04-25 ENCOUNTER — Other Ambulatory Visit (INDEPENDENT_AMBULATORY_CARE_PROVIDER_SITE_OTHER): Payer: Self-pay | Admitting: Nurse Practitioner

## 2023-04-29 MED ORDER — LACTATED RINGERS IV SOLN: INTRAVENOUS | Status: DC

## 2023-04-29 MED ORDER — CHLORHEXIDINE GLUCONATE CLOTH 2 % EX PADS: 6.0000 | MEDICATED_PAD | Freq: Once | CUTANEOUS | Status: DC

## 2023-04-29 MED ORDER — ORAL CARE MOUTH RINSE: 15.0000 mL | Freq: Once | OROMUCOSAL | Status: AC

## 2023-04-29 MED ORDER — CHLORHEXIDINE GLUCONATE 0.12 % MT SOLN
15.0000 mL | Freq: Once | OROMUCOSAL | Status: AC
  Administered 2023-04-30: 15 mL via OROMUCOSAL

## 2023-04-29 MED ORDER — CEFAZOLIN SODIUM-DEXTROSE 2-4 GM/100ML-% IV SOLN
2.0000 g | INTRAVENOUS | Status: AC
  Administered 2023-04-30: 2 g via INTRAVENOUS

## 2023-04-30 ENCOUNTER — Ambulatory Visit
Admission: RE | Admit: 2023-04-30 | Discharge: 2023-04-30 | Disposition: A | Discharge status: Home / Self Care | Payer: 59 | Attending: Vascular Surgery | Admitting: Vascular Surgery

## 2023-04-30 ENCOUNTER — Ambulatory Visit: Payer: 59 | Admitting: Anesthesiology

## 2023-04-30 ENCOUNTER — Encounter: Payer: Self-pay | Admitting: Vascular Surgery

## 2023-04-30 ENCOUNTER — Encounter
Admission: RE | Disposition: A | Discharge status: Home / Self Care | Payer: Self-pay | Source: Home / Self Care | Attending: Vascular Surgery

## 2023-04-30 ENCOUNTER — Other Ambulatory Visit: Payer: Self-pay

## 2023-04-30 DIAGNOSIS — T8789 Other complications of amputation stump: Secondary | ICD-10-CM

## 2023-04-30 DIAGNOSIS — Z481 Encounter for planned postprocedural wound closure: Secondary | ICD-10-CM

## 2023-04-30 DIAGNOSIS — J449 Chronic obstructive pulmonary disease, unspecified: Secondary | ICD-10-CM | POA: Diagnosis not present

## 2023-04-30 DIAGNOSIS — I509 Heart failure, unspecified: Secondary | ICD-10-CM | POA: Diagnosis not present

## 2023-04-30 DIAGNOSIS — I2510 Atherosclerotic heart disease of native coronary artery without angina pectoris: Secondary | ICD-10-CM | POA: Diagnosis not present

## 2023-04-30 DIAGNOSIS — T8189XA Other complications of procedures, not elsewhere classified, initial encounter: Secondary | ICD-10-CM | POA: Insufficient documentation

## 2023-04-30 DIAGNOSIS — I252 Old myocardial infarction: Secondary | ICD-10-CM | POA: Diagnosis not present

## 2023-04-30 DIAGNOSIS — G709 Myoneural disorder, unspecified: Secondary | ICD-10-CM | POA: Insufficient documentation

## 2023-04-30 DIAGNOSIS — I110 Hypertensive heart disease with heart failure: Secondary | ICD-10-CM | POA: Insufficient documentation

## 2023-04-30 DIAGNOSIS — Z89612 Acquired absence of left leg above knee: Secondary | ICD-10-CM | POA: Insufficient documentation

## 2023-04-30 DIAGNOSIS — I739 Peripheral vascular disease, unspecified: Secondary | ICD-10-CM | POA: Insufficient documentation

## 2023-04-30 DIAGNOSIS — T8740 Infection of amputation stump, unspecified extremity: Secondary | ICD-10-CM

## 2023-04-30 HISTORY — PX: APPLICATION OF WOUND VAC: SHX5189

## 2023-04-30 SURGERY — APPLICATION, WOUND VAC
Anesthesia: General | Laterality: Left

## 2023-04-30 MED ORDER — PHENYLEPHRINE 80 MCG/ML (10ML) SYRINGE FOR IV PUSH (FOR BLOOD PRESSURE SUPPORT)
PREFILLED_SYRINGE | INTRAVENOUS | Status: DC | PRN
  Administered 2023-04-30: 160 ug via INTRAVENOUS
  Administered 2023-04-30: 80 ug via INTRAVENOUS

## 2023-04-30 MED ORDER — BUPIVACAINE HCL (PF) 0.5 % IJ SOLN
INTRAMUSCULAR | Status: AC
  Filled 2023-04-30: qty 30

## 2023-04-30 MED ORDER — OXYCODONE HCL 5 MG/5ML PO SOLN: 5.0000 mg | Freq: Once | ORAL | Status: AC | PRN

## 2023-04-30 MED ORDER — BUPIVACAINE HCL (PF) 0.5 % IJ SOLN
INTRAMUSCULAR | Status: DC | PRN
  Administered 2023-04-30: 20 mL

## 2023-04-30 MED ORDER — PROPOFOL 500 MG/50ML IV EMUL
INTRAVENOUS | Status: DC | PRN
  Administered 2023-04-30: 100 ug/kg/min via INTRAVENOUS

## 2023-04-30 MED ORDER — PHENYLEPHRINE 80 MCG/ML (10ML) SYRINGE FOR IV PUSH (FOR BLOOD PRESSURE SUPPORT)
PREFILLED_SYRINGE | INTRAVENOUS | Status: AC
  Filled 2023-04-30: qty 10

## 2023-04-30 MED ORDER — PROPOFOL 1000 MG/100ML IV EMUL
INTRAVENOUS | Status: AC
  Filled 2023-04-30: qty 100

## 2023-04-30 MED ORDER — CHLORHEXIDINE GLUCONATE 0.12 % MT SOLN
OROMUCOSAL | Status: AC
  Filled 2023-04-30: qty 15

## 2023-04-30 MED ORDER — OXYCODONE HCL 5 MG PO TABS
5.0000 mg | ORAL_TABLET | Freq: Once | ORAL | Status: AC | PRN
  Administered 2023-04-30: 5 mg via ORAL

## 2023-04-30 MED ORDER — FENTANYL CITRATE (PF) 100 MCG/2ML IJ SOLN
INTRAMUSCULAR | Status: AC
  Filled 2023-04-30: qty 2

## 2023-04-30 MED ORDER — LIDOCAINE HCL (PF) 2 % IJ SOLN
INTRAMUSCULAR | Status: AC
  Filled 2023-04-30: qty 5

## 2023-04-30 MED ORDER — FENTANYL CITRATE (PF) 100 MCG/2ML IJ SOLN
INTRAMUSCULAR | Status: DC | PRN
  Administered 2023-04-30: 50 ug via INTRAVENOUS

## 2023-04-30 MED ORDER — 0.9 % SODIUM CHLORIDE (POUR BTL) OPTIME
TOPICAL | Status: DC | PRN
  Administered 2023-04-30: 1000 mL

## 2023-04-30 MED ORDER — CEFAZOLIN SODIUM-DEXTROSE 2-4 GM/100ML-% IV SOLN
INTRAVENOUS | Status: AC
  Filled 2023-04-30: qty 100

## 2023-04-30 MED ORDER — OXYCODONE HCL 5 MG PO TABS
ORAL_TABLET | ORAL | Status: AC
  Filled 2023-04-30: qty 1

## 2023-04-30 MED ORDER — FENTANYL CITRATE (PF) 100 MCG/2ML IJ SOLN
25.0000 ug | INTRAMUSCULAR | Status: DC | PRN
  Administered 2023-04-30 (×3): 25 ug via INTRAVENOUS

## 2023-04-30 SURGICAL SUPPLY — 24 items
CANISTER WOUND CARE 500ML ATS (WOUND CARE) ×1 IMPLANT
DRAPE INCISE IOBAN 66X45 STRL (DRAPES) ×1 IMPLANT
DRAPE LAPAROTOMY 100X77 ABD (DRAPES) ×1 IMPLANT
DRSG VAC GRANUFOAM LG (GAUZE/BANDAGES/DRESSINGS) ×1 IMPLANT
DRSG VAC GRANUFOAM MED (GAUZE/BANDAGES/DRESSINGS) ×1 IMPLANT
ELECT REM PT RETURN 9FT ADLT (ELECTROSURGICAL) ×1
ELECTRODE REM PT RTRN 9FT ADLT (ELECTROSURGICAL) ×1 IMPLANT
GAUZE 4X4 16PLY ~~LOC~~+RFID DBL (SPONGE) ×1 IMPLANT
GLOVE BIO SURGEON STRL SZ7 (GLOVE) ×2 IMPLANT
GOWN STRL REUS W/ TWL LRG LVL3 (GOWN DISPOSABLE) ×2 IMPLANT
GOWN STRL REUS W/ TWL XL LVL3 (GOWN DISPOSABLE) ×1 IMPLANT
GOWN STRL REUS W/TWL LRG LVL3 (GOWN DISPOSABLE) ×2
GOWN STRL REUS W/TWL XL LVL3 (GOWN DISPOSABLE) ×1
GRAFT SKIN WND MICRO 38 (Tissue) IMPLANT
KIT TURNOVER KIT A (KITS) ×1 IMPLANT
MANIFOLD NEPTUNE II (INSTRUMENTS) ×1 IMPLANT
NS IRRIG 1000ML POUR BTL (IV SOLUTION) ×1 IMPLANT
PACK BASIN MINOR ARMC (MISCELLANEOUS) ×1 IMPLANT
SOL PREP PVP 2OZ (MISCELLANEOUS) ×1
SOLUTION PREP PVP 2OZ (MISCELLANEOUS) ×1 IMPLANT
SPONGE T-LAP 18X18 ~~LOC~~+RFID (SPONGE) ×1 IMPLANT
SUT ETHILON 2 0 FS 18 (SUTURE) IMPLANT
TRAP FLUID SMOKE EVACUATOR (MISCELLANEOUS) ×1 IMPLANT
WATER STERILE IRR 500ML POUR (IV SOLUTION) ×1 IMPLANT

## 2023-04-30 NOTE — Interval H&P Note (Signed)
History and Physical Interval Note:  04/30/2023 8:23 AM  Carla [name]  has presented today for surgery, with the diagnosis of infection.  The various methods of treatment have been discussed with the patient and family. After consideration of risks, benefits and other options for treatment, the patient has consented to  Procedure(s): LEFT STUMP WOUND VAC EXCHANGE (Left) as a surgical intervention.  The patient's history has been reviewed, patient examined, no change in status, stable for surgery.  I have reviewed the patient's chart and labs.  Questions were answered to the patient's satisfaction.     Festus Barren

## 2023-04-30 NOTE — Discharge Instructions (Signed)

## 2023-04-30 NOTE — Anesthesia Postprocedure Evaluation (Signed)
Anesthesia Post Note  Patient: Carla [name]  Procedure(s) Performed: LEFT STUMP WOUND VAC EXCHANGE (Left)  Patient location during evaluation: PACU Anesthesia Type: General Level of consciousness: awake and alert Pain management: pain level controlled Vital Signs Assessment: post-procedure vital signs reviewed and stable Respiratory status: spontaneous breathing, nonlabored ventilation, respiratory function stable and patient connected to nasal cannula oxygen Cardiovascular status: blood pressure returned to baseline and stable Postop Assessment: no apparent nausea or vomiting Anesthetic complications: no   No notable events documented.   Last Vitals:  Vitals:   04/30/23 1030 04/30/23 1109  BP: 110/64 129/75  Pulse: 85 81  Resp: 13 18  Temp:  37.1 C  SpO2: 94% 94%    Last Pain:  Vitals:   04/30/23 1109  TempSrc: Temporal  PainSc: 7                  Cleda Mccreedy Khaya Theissen

## 2023-04-30 NOTE — Op Note (Signed)
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   Delayed primary closure of left above-knee amputation site with wound VAC (nondisposable) placement along with Kerecis biologic placement in the wound  SURGEON: Festus Barren  ASSISTANT(S): None  ANESTHESIA: Local with MAC  ESTIMATED BLOOD LOSS: Minimal  FINDING(S): 1.  Wound with excellent granulation tissue and no evidence of purulence or infection.  It measured about 14 cm in length, was down to about 4 to 4-1/2 cm in width, and had about 2 cm in depth.  This was clean and mobile and could be reapproximated with nylon sutures to help bring skin reapproximation.   INDICATIONS:   Carla [name] is a 62 y.o. female who presents with an open left above-knee amputation site status postdebridement with multiple VAC changes and debridements.  She is brought in for reevaluation of the wound.  Risks and benefits were discussed and informed consent was obtained.  DESCRIPTION: After obtaining full informed written consent, the patient was brought back to the operating room and placed supine upon the operating table.  The patient was prepped and draped in the standard fashion.  The wound measured approximately 14 cm in length and was about 4 to 4-1/2 cm in width.  There is about 2 cm in depth at its deepest portion.  This was clean and the skin was mobile.  There was no evidence of purulence or infection.  It was relatively easy to reapproximate the skin edges and I thought this would be our best bet of getting the wound closed.  I then freshened up the wound with Metzenbaum scissors for any nonviable fat and then placed a packet of the granulated Kerecis biologic into the wound to help augment closure.  I then used a series of interrupted mattress 2-0 nylon sutures to  reapproximate the skin edge throughout the wound.  This was brought together without any tension whatsoever.  I then used a VAC sponge and cut to fit this over the loosely approximated skin edge.  Strips of Ioban were used and a good occlusive seal was obtained over the sponge.  It was then connected to suction tubing with a good seal. At this point, we elected to complete the procedure.  The patient was taken to the recovery room in stable condition.   COMPLICATIONS: None  CONDITION: Stable  Festus Barren  04/30/2023, 10:02 AM    This note was created with Dragon Medical transcription system. Any errors in dictation are purely unintentional.

## 2023-04-30 NOTE — Transfer of Care (Signed)
Immediate Anesthesia Transfer of Care Note  Patient: Carla [name]  Procedure(s) Performed: LEFT STUMP WOUND VAC EXCHANGE (Left)  Patient Location: PACU  Anesthesia Type:General  Level of Consciousness: drowsy and patient cooperative  Airway & Oxygen Therapy: Patient Spontanous Breathing and Patient connected to nasal cannula oxygen  Post-op Assessment: Report given to RN and Post -op Vital signs reviewed and stable  Post vital signs: Reviewed and stable  Last Vitals:  Vitals Value Taken Time  BP 129/75 04/30/23 1109  Temp 37.1 C 04/30/23 1109  Pulse 81 04/30/23 1109  Resp 18 04/30/23 1109  SpO2 94 % 04/30/23 1109    Last Pain:  Vitals:   04/30/23 1109  TempSrc: Temporal  PainSc: 7          Complications: No notable events documented.

## 2023-04-30 NOTE — OR Nursing (Signed)
Wound measurements 14 X 4.5 X 2 prior to close

## 2023-04-30 NOTE — Anesthesia Preprocedure Evaluation (Signed)
Anesthesia Evaluation  Patient identified by MRN, date of birth, ID band Patient awake    Reviewed: Allergy & Precautions, NPO status , Patient's Chart, lab work & pertinent test results  History of Anesthesia Complications Negative for: history of anesthetic complications  Airway Mallampati: III  TM Distance: <3 FB Neck ROM: full    Dental  (+) Poor Dentition, Missing   Pulmonary COPD, Patient abstained from smoking., former smoker   Pulmonary exam normal        Cardiovascular hypertension, (-) angina + CAD, + Past MI, + Peripheral Vascular Disease and +CHF  Normal cardiovascular exam     Neuro/Psych  PSYCHIATRIC DISORDERS       Neuromuscular disease    GI/Hepatic Neg liver ROS,GERD  ,,  Endo/Other  negative endocrine ROS    Renal/GU Renal disease  negative genitourinary   Musculoskeletal   Abdominal   Peds  Hematology negative hematology ROS (+)   Anesthesia Other Findings Past Medical History: No date: Abdominal aortic atherosclerosis (HCC)     Comment:  a. 05/2017 CTA abd/pelvis: significant atherosclerotic               dzs of the infrarenal abd Ao w/ some mural thrombus. No               aneurysm or dissection. No date: Acute focal ischemia of small intestine (HCC) 06/13/2017: Acute right-sided low back pain with right-sided sciatica 07/29/2017: AKI (acute kidney injury) (HCC) No date: Anemia No date: Anginal pain (HCC)     Comment:  a. 08/2012 Lexiscan MV: EF 54%, non ischemia/infarct. No date: Anxiety and depression No date: Baker's cyst of knee, right     Comment:  a. 07/2016 U/S: 4.1 x 1.4 x 2.9 cystic structure in R               poplitetal fossa. No date: Bell's palsy No date: CAD (coronary artery disease) No date: CHF (congestive heart failure) (HCC)     Comment:  a.) TTE 06/20/2017: EF 60-65%, no rwma, G1DD, no source               of cardiac emboli. No date: Chronic anticoagulation      Comment:  a.) apixaban No date: Chronic idiopathic constipation No date: Chronic mesenteric ischemia (HCC) No date: Chronic pain No date: COPD (chronic obstructive pulmonary disease) (HCC) No date: Dyspnea No date: Embolus of superior mesenteric artery (HCC)     Comment:  a. 05/2017 CTA Abd/pelvis: apparent thrombus or embolus               in prox SMA (70-90%); b. 05/2017 catheter directed tPA,               mechanical thrombectomy, and stenting of the SMA. 01/19/2016: Essential hypertension with goal blood pressure less than  140/90 01/19/2016: Gastroesophageal reflux disease No date: GERD (gastroesophageal reflux disease) No date: H/O colectomy 11/14/2019: History of 2019 novel coronavirus disease (COVID-19) No date: History of kidney stones No date: Hyperlipidemia No date: Hypertension No date: Morbid obesity with BMI of 40.0-44.9, adult (HCC) 06/27/2021: NSTEMI (non-ST elevated myocardial infarction) (HCC)     Comment:  a.) high sensitivity troponins trended: 893 --> 1587 -->              1748 --> 868 --> 735 ng/L 06/19/2017: Occlusive mesenteric ischemia (HCC) No date: Osteoarthritis No date: PAD (peripheral artery disease) (HCC) No date: PAF (paroxysmal atrial fibrillation) (HCC)     Comment:  a.)  CHA2DS2-VASc = 6 (sex, CHF, HTN, DVT x2, aortic               plaque). b.) rate/rhythm maintained on oral metoprolol               tartrate; chronically anticoagulated with standard dose               apixaban. No date: Palpitations No date: Peripheral edema 06/01/2016: Personal history of other malignant neoplasm of skin No date: Pneumonia No date: Pre-diabetes 06/13/2017: Primary osteoarthritis of both knees 08/06/2017: SBO (small bowel obstruction) (HCC) No date: Skin cancer of nose No date: Superior mesenteric artery thrombosis (HCC) No date: Vitamin D deficiency  Past Surgical History: 04/06/2022: AMPUTATION; Right     Comment:  Procedure: AMPUTATION ABOVE KNEE;   Surgeon: Annice Needy, MD;  Location: [facility];  Service: Vascular;                Laterality: Right; 12/07/2022: AMPUTATION     Comment:  Procedure: AMPUTATION ABOVE KNEE REVISION WITH RESECTION              OF FEMUR;  Surgeon: Annice Needy, MD;  Location: [facility]               ORS;  Service: Vascular;; 02/21/2023: AMPUTATION; Left     Comment:  Procedure: AMPUTATION ABOVE KNEE;  Surgeon: Annice Needy, MD;  Location: [facility];  Service: General;                Laterality: Left; 03/22/2023: AMPUTATION; Left     Comment:  Procedure: AMPUTATION ABOVE KNEE STUMP DEBIDEMENT;                Surgeon: Annice Needy, MD;  Location: [facility];  Service:              Vascular;  Laterality: Left; 12/09/2019: AMPUTATION TOE; Left     Comment:  Procedure: AMPUTATION TOE MPJ left;  Surgeon: Rosetta Posner, DPM;  Location: [facility];  Service: Podiatry;                Laterality: Left; No date: APPENDECTOMY 12/01/2021: APPLICATION OF WOUND VAC     Comment:  Procedure: APPLICATION OF WOUND VAC;  Surgeon: Annice Needy, MD;  Location: [facility];  Service: Vascular;; 05/03/2022: APPLICATION OF WOUND VAC; Right     Comment:  Procedure: APPLICATION OF WOUND VAC;  Surgeon: Annice Needy, MD;  Location: [facility];  Service: Vascular;                Laterality: Right; 07/23/2022: APPLICATION OF WOUND VAC; Left     Comment:  Procedure: APPLICATION OF WOUND VAC;  Surgeon: Nada Libman, MD;  Location: [facility];  Service: Vascular;                Laterality: Left;  Prevena 08/28/2022: APPLICATION OF WOUND VAC; Right     Comment:  Procedure: APPLICATION OF WOUND VAC;  Surgeon: Wyn Quaker,  Marlow Baars, MD;  Location: [facility];  Service: Vascular;                Laterality: Right; 12/07/2022: APPLICATION OF WOUND VAC     Comment:  Procedure: APPLICATION OF WOUND VAC;  Surgeon: Annice Needy, MD;  Location: [facility];  Service:  Vascular;; 03/28/2023: APPLICATION OF WOUND VAC; Left     Comment:  Procedure: LEFT STUMP WOUND VAC EXCHANGE;  Surgeon: Annice Needy, MD;  Location: [facility];  Service: Vascular;                Laterality: Left; 04/02/2023: APPLICATION OF WOUND VAC; Left     Comment:  Procedure: LEFT STUMP WOUND VAC EXCHANGE;  Surgeon: Annice Needy, MD;  Location: [facility];  Service: Vascular;                Laterality: Left; 04/05/2023: APPLICATION OF WOUND VAC; Left     Comment:  Procedure: LEFT STUMP WOUND VAC EXCHANGE;  Surgeon: Annice Needy, MD;  Location: [facility];  Service: Vascular;                Laterality: Left; 04/09/2023: APPLICATION OF WOUND VAC; Left     Comment:  Procedure: LEFT STUMP WOUND VAC EXCHANGE;  Surgeon: Annice Needy, MD;  Location: [facility];  Service: Vascular;                Laterality: Left; 04/16/2023: APPLICATION OF WOUND VAC; Left     Comment:  Procedure: LEFT STUMP WOUND VAC EXCHANGE;  Surgeon: Annice Needy, MD;  Location: [facility];  Service: Vascular;                Laterality: Left; 04/23/2023: APPLICATION OF WOUND VAC; Left     Comment:  Procedure: LEFT STUMP WOUND VAC EXCHANGE;  Surgeon:               Renford Dills, MD;  Location: [facility];  Service:               Vascular;  Laterality: Left; 07/23/2022: AXILLARY-FEMORAL BYPASS GRAFT; Left     Comment:  Procedure: BYPASS GRAFT AXILLA-BIFEMORAL;  Surgeon:               Nada Libman, MD;  Location: [facility];  Service:               Vascular;  Laterality: Left; No date: CHOLECYSTECTOMY No date: COLON SURGERY 03/23/2022: COLONOSCOPY WITH PROPOFOL; N/A     Comment:  Procedure: COLONOSCOPY WITH PROPOFOL;  Surgeon: Wyline Mood, MD;  Location: [facility] END[facility];  Service:               Gastroenterology;  Laterality: N/A;  rectal bleed 02/15/2023: CORONARY/GRAFT ACUTE MI REVASCULARIZATION; N/A     Comment:  Procedure: Coronary/Graft Acute MI  Revascularization;                Surgeon: Herbie Baltimore,  Piedad Climes, MD;  Location: [facility] INVASIVE               CV LAB;  Service: Cardiovascular;  Laterality: N/A; 06/21/2020: EMBOLECTOMY OR THROMBECTOMY, WITH OR WITHOUT CATHETER;  FEMOROPOPLITEAL, AORTOILIAC ARTERY, BY LEG INCISION; N/A     Comment:  Left - Decomp Fasciotomy Leg; Ant &/Or Lat Compart Only;              Midline - Angiography, Visceral, Selective Or               Supraselective (With Or Without Flush Aortogram); Left -               Revascularize, Endovasc, Open/Percut, Iliac Artery,               Unilat, Initial Vessel; W/Translum Stent, W/Angioplasty;               Right - Revascularize, Endovasc, Open/Percut, Iliac               Artery, Ea Add`L Ipsilateral; W/Translumin Stent,               W/Angioplasty; Location [facility]; 12/01/2021: ENDARTERECTOMY FEMORAL; Left     Comment:  Procedure: ENDARTERECTOMY FEMORAL;  Surgeon: Annice Needy, MD;  Location: [facility];  Service: Vascular;                Laterality: Left; 12/01/2021: ENDOVASCULAR REPAIR/STENT GRAFT; Left     Comment:  Procedure: ENDOVASCULAR REPAIR/STENT GRAFT;  Surgeon:               Annice Needy, MD;  Location: [facility];                Service: Cardiovascular;  Laterality: Left; 05/03/2022: INCISION AND DRAINAGE OF WOUND; Right     Comment:  Procedure: IRRIGATION AND DEBRIDEMENT RIGHT STUMP;                Surgeon: Annice Needy, MD;  Location: [facility];  Service:              Vascular;  Laterality: Right; 08/28/2022: IRRIGATION AND DEBRIDEMENT ABSCESS; Right     Comment:  Procedure: AKA IRRIGATION AND DEBRIDEMENT ABSCESS;                Surgeon: Annice Needy, MD;  Location: [facility];  Service:              Vascular;  Laterality: Right; 08/08/2017: LAPAROTOMY; N/A     Comment:  Procedure: EXPLORATORY LAPAROTOMY POSSIBLE BOWEL               RESECTION;  Surgeon: Leafy Ro, MD;  Location: [facility]               ORS;  Service: General;  Laterality:  N/A; 05/17/2021: LEFT HEART CATH AND CORONARY ANGIOGRAPHY; N/A     Comment:  Procedure: LEFT HEART CATH AND CORONARY ANGIOGRAPHY;                Surgeon: Alwyn Pea, MD;  Location: [facility]              CV LAB;  Service: Cardiovascular;  Laterality: N/A; 02/15/2023: LEFT HEART CATH AND CORONARY ANGIOGRAPHY; N/A     Comment:  Procedure: LEFT HEART CATH AND CORONARY ANGIOGRAPHY;  Surgeon: Marykay Lex, MD;  Location: [facility] INVAS[facility]               CV LAB;  Service: Cardiovascular;  Laterality: N/A; 11/17/2019: LOWER EXTREMITY ANGIOGRAPHY; Left     Comment:  Procedure: LOWER EXTREMITY ANGIOGRAPHY;  Surgeon: Annice Needy, MD;  Location: [facility];  Service:               Cardiovascular;  Laterality: Left; 12/13/2020: LOWER EXTREMITY ANGIOGRAPHY; Right     Comment:  Procedure: LOWER EXTREMITY ANGIOGRAPHY;  Surgeon: Annice Needy, MD;  Location: [facility];  Service:               Cardiovascular;  Laterality: Right; 01/10/2021: LOWER EXTREMITY ANGIOGRAPHY; Right     Comment:  Procedure: LOWER EXTREMITY ANGIOGRAPHY;  Surgeon: Annice Needy, MD;  Location: [facility];  Service:               Cardiovascular;  Laterality: Right; 01/24/2021: LOWER EXTREMITY ANGIOGRAPHY; Right     Comment:  Procedure: LOWER EXTREMITY ANGIOGRAPHY;  Surgeon: Annice Needy, MD;  Location: [facility];  Service:               Cardiovascular;  Laterality: Right; 06/28/2021: LOWER EXTREMITY ANGIOGRAPHY; Bilateral     Comment:  Procedure: Lower Extremity Angiography;  Surgeon: Annice Needy, MD;  Location: [facility];  Service:               Cardiovascular;  Laterality: Bilateral; 11/28/2021: LOWER EXTREMITY ANGIOGRAPHY; Right     Comment:  Procedure: LOWER EXTREMITY ANGIOGRAPHY;  Surgeon: Annice Needy, MD;  Location: [facility];  Service:                Cardiovascular;  Laterality: Right; 11/30/2021: LOWER EXTREMITY ANGIOGRAPHY; Left     Comment:  Procedure: Lower Extremity Angiography;  Surgeon: Annice Needy, MD;  Location: [facility];  Service:               Cardiovascular;  Laterality: Left; 02/22/2022: LOWER EXTREMITY ANGIOGRAPHY; Right     Comment:  Procedure: Lower Extremity Angiography;  Surgeon: Annice Needy, MD;  Location: [facility];  Service:               Cardiovascular;  Laterality: Right; 03/13/2022: LOWER EXTREMITY ANGIOGRAPHY; Right     Comment:  Procedure: Lower Extremity Angiography;  Surgeon: Annice Needy, MD;  Location: [facility];  Service:               Cardiovascular;  Laterality: Right; 03/24/2022: LOWER EXTREMITY ANGIOGRAPHY; Right     Comment:  Procedure: Lower Extremity Angiography;  Surgeon: Wyn Quaker,  Marlow Baars, MD;  Location: [facility];  Service:               Cardiovascular;  Laterality: Right; 01/17/2023: LOWER EXTREMITY ANGIOGRAPHY; Left     Comment:  Procedure: Lower Extremity Angiography;  Surgeon: Annice Needy, MD;  Location: [facility];  Service:               Cardiovascular;  Laterality: Left; 11/19/2019: LOWER EXTREMITY INTERVENTION; N/A     Comment:  Procedure: LOWER EXTREMITY INTERVENTION;  Surgeon: Annice Needy, MD;  Location: [facility];  Service:               Cardiovascular;  Laterality: N/A; 06/29/2021: LOWER EXTREMITY INTERVENTION; Bilateral     Comment:  Procedure: LOWER EXTREMITY INTERVENTION;  Surgeon: Annice Needy, MD;  Location: [facility];  Service:               Cardiovascular;  Laterality: Bilateral; 01/18/2023: LOWER EXTREMITY INTERVENTION; Left     Comment:  Procedure: LOWER EXTREMITY INTERVENTION;  Surgeon: Annice Needy, MD;  Location: [facility];  Service:               Cardiovascular;  Laterality:  Left; 06/22/2017: TEE WITHOUT CARDIOVERSION; N/A     Comment:  Procedure: TRANSESOPHAGEAL ECHOCARDIOGRAM (TEE);                Surgeon: Iran Ouch, MD;  Location: [facility];                Service: Cardiovascular;  Laterality: N/A; 07/11/2018: TOTAL KNEE ARTHROPLASTY; Right     Comment:  Procedure: TOTAL KNEE ARTHROPLASTY;  Surgeon: Christena Flake, MD;  Location: [facility];  Service: Orthopedics;                Laterality: Right; No date: VAGINAL HYSTERECTOMY 06/20/2017: VISCERAL ARTERY INTERVENTION; N/A     Comment:  Procedure: Visceral Artery Intervention, possible aortic              thrombectomy;  Surgeon: Annice Needy, MD;  Location: [facility]              INVASIVE CV LAB;  Service: Cardiovascular;  Laterality:               N/A; 01/28/2018: VISCERAL ARTERY INTERVENTION; N/A     Comment:  Procedure: VISCERAL ARTERY INTERVENTION;  Surgeon: Annice Needy, MD;  Location: [facility];  Service:               Cardiovascular;  Laterality: N/A;  BMI    Body Mass Index: 32.92 kg/m      Reproductive/Obstetrics negative OB ROS                             Anesthesia Physical Anesthesia Plan  ASA: 3  Anesthesia Plan: General   Post-op Pain Management:    Induction:  Intravenous  PONV Risk Score and Plan: Propofol infusion and TIVA  Airway Management Planned: Natural Airway and Nasal Cannula  Additional Equipment:   Intra-op Plan:   Post-operative Plan:   Informed Consent: I have reviewed the patients History and Physical, chart, labs and discussed the procedure including the risks, benefits and alternatives for the proposed anesthesia with the patient or authorized representative who has indicated his/her understanding and acceptance.     Dental Advisory Given  Plan Discussed with: Anesthesiologist, CRNA and Surgeon  Anesthesia Plan Comments: (Patient consented for risks of anesthesia including but not limited  to:  - adverse reactions to medications - risk of airway placement if required - damage to eyes, teeth, lips or other oral mucosa - nerve damage due to positioning  - sore throat or hoarseness - Damage to heart, brain, nerves, lungs, other parts of body or loss of life  Patient voiced understanding.)       Anesthesia Quick Evaluation

## 2023-05-01 ENCOUNTER — Other Ambulatory Visit: Payer: Self-pay

## 2023-05-01 ENCOUNTER — Inpatient Hospital Stay: Payer: 59 | Admitting: Infectious Diseases

## 2023-05-01 ENCOUNTER — Encounter: Payer: Self-pay | Admitting: Vascular Surgery

## 2023-05-02 ENCOUNTER — Other Ambulatory Visit (INDEPENDENT_AMBULATORY_CARE_PROVIDER_SITE_OTHER): Payer: Self-pay | Admitting: Nurse Practitioner

## 2023-05-02 ENCOUNTER — Telehealth (INDEPENDENT_AMBULATORY_CARE_PROVIDER_SITE_OTHER): Payer: Self-pay

## 2023-05-02 NOTE — Telephone Encounter (Signed)
Patient made notified with medical recommendations and also requesting information has been faxed to [employer] at [phone]. Patient verbalized understanding with recommendations.

## 2023-05-02 NOTE — Telephone Encounter (Signed)
Patient reach out to the office requesting for rx for Oxycodone 10mg  for pain relief. Also she informed that she reach to [employer] for wound vac supplies but the company stated that wound measurement will need to be provided. I have contacted [employer] and requesting information for measurements, new rx, will be faxed. Also they open a new claim to start this process

## 2023-05-02 NOTE — Telephone Encounter (Signed)
She cannot receive pain medication.  She received 30 days of xtampza (which is oxycodone) on 04/25/2023.  She also received 30 days of 15 mg of oxycodone on 04/20/2023.  We will not send in additional pain medication.  Additionally, if the person providing her pain medication sees that she is getting it from multiple sources, they may stop providing it to her so I would suggest that she continue if they are providing her with long term pain medication.  We will sign [employer] paperwork once received

## 2023-05-03 ENCOUNTER — Ambulatory Visit: Payer: 59 | Attending: Infectious Diseases | Admitting: Infectious Diseases

## 2023-05-03 ENCOUNTER — Encounter: Payer: Self-pay | Admitting: Vascular Surgery

## 2023-05-03 DIAGNOSIS — T8740 Infection of amputation stump, unspecified extremity: Secondary | ICD-10-CM

## 2023-05-03 DIAGNOSIS — T8744 Infection of amputation stump, left lower extremity: Secondary | ICD-10-CM | POA: Diagnosis present

## 2023-05-03 DIAGNOSIS — Z8619 Personal history of other infectious and parasitic diseases: Secondary | ICD-10-CM | POA: Diagnosis not present

## 2023-05-04 NOTE — Progress Notes (Signed)
The purpose of this virtual visit is to provide medical care while limiting exposure to the novel coronavirus (COVID19) for both patient and office staff.   Consent was obtained for phone visit:  Yes.   Answered questions that patient had about telehealth interaction:  Yes.   I discussed the limitations, risks, security and privacy concerns of performing an evaluation and management service by telephone. I also discussed with the patient that there may be a patient responsible charge related to this service. The patient expressed understanding and agreed to proceed.   Patient Location: Home Provider Location:office Follow-up visit after recent hospitalization between 03/21/2023 until 04/17/2023 close for infected left AKA stump.  She had multiple debridement.  Culture was positive for ESBL E. coli and MRSA.  Patient was treated with meropenem and daptomycin.  The plan was to give her 6 weeks of antibiotics.  Patient was discharged home on 04/17/2023 on omadacycline and Augmentin until 05/04/2023.  Patient states she has completed the omadacycline she has a few more doses of Augmentin she had a follow-up appointment with Dr. Wyn Quaker on Monday and had wound vac change As per Dr. Driscilla Grammes note the wound is looking very well with no purulence or infection with nice granulation tissue. Next appointment is on 15th.  Will discuss with Dr. Wyn Quaker to do labs including CBC, CMP, ESR and CRP.  Will also request him to take a picture Patient is doing okay except for pain. No fever or chills No nausea vomiting Appetite fair. 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 coli and Proteus.  Has nearly completed 6 weeks of antibiotics.   can stop antibiotics and observe off of it Discussed the management with the patient Time spent on this phone call was 10 minutes.

## 2023-05-07 ENCOUNTER — Ambulatory Visit (INDEPENDENT_AMBULATORY_CARE_PROVIDER_SITE_OTHER): Payer: 59 | Admitting: Nurse Practitioner

## 2023-05-07 ENCOUNTER — Ambulatory Visit (INDEPENDENT_AMBULATORY_CARE_PROVIDER_SITE_OTHER): Payer: 59

## 2023-05-08 ENCOUNTER — Ambulatory Visit (INDEPENDENT_AMBULATORY_CARE_PROVIDER_SITE_OTHER): Payer: 59

## 2023-05-08 ENCOUNTER — Telehealth (INDEPENDENT_AMBULATORY_CARE_PROVIDER_SITE_OTHER): Payer: Self-pay | Admitting: Nurse Practitioner

## 2023-05-08 NOTE — Telephone Encounter (Signed)
Can we call the company directly and ensure that she gets her canisters

## 2023-05-08 NOTE — Telephone Encounter (Signed)
Patient called and stated she didn't get the right supplies for her wound vac changes. States she keeps getting sponges and not the canisters.she states it hasn't been changed in 8 days. Wanted to know can she come to OR to get it done one more time.     Please call and advise

## 2023-05-08 NOTE — Telephone Encounter (Signed)
I reach out to Carla [name] spoke with [employer] on the status of wound vac containers and was informed that the order was placed today with no ETA. Patient will come in tomorrow and per Dr Wyn Quaker vac can be removed and placed xeroform with gauze over amputation site. Once supplies has been received patient will come in to have vac replaced. Patient was notified with medical advice and verbalized understanding.

## 2023-05-09 ENCOUNTER — Ambulatory Visit (INDEPENDENT_AMBULATORY_CARE_PROVIDER_SITE_OTHER): Payer: 59 | Admitting: Nurse Practitioner

## 2023-05-09 ENCOUNTER — Encounter (INDEPENDENT_AMBULATORY_CARE_PROVIDER_SITE_OTHER): Payer: Self-pay

## 2023-05-09 VITALS — BP 117/79 | HR 71 | Resp 16

## 2023-05-09 DIAGNOSIS — S78112A Complete traumatic amputation at level between left hip and knee, initial encounter: Secondary | ICD-10-CM

## 2023-05-09 NOTE — Progress Notes (Signed)
Patient came to have wound vac removed until supplies has been delivered to patient home. The left amputation site was covered with xeroform,gauze and tape. Patient will return next week.

## 2023-05-17 ENCOUNTER — Encounter (INDEPENDENT_AMBULATORY_CARE_PROVIDER_SITE_OTHER): Payer: Self-pay

## 2023-05-17 ENCOUNTER — Ambulatory Visit (INDEPENDENT_AMBULATORY_CARE_PROVIDER_SITE_OTHER): Payer: 59 | Admitting: Vascular Surgery

## 2023-05-17 VITALS — BP 118/84 | HR 94 | Resp 16

## 2023-05-17 DIAGNOSIS — Z89611 Acquired absence of right leg above knee: Secondary | ICD-10-CM

## 2023-05-17 NOTE — Progress Notes (Signed)
Patient came in for wound check for bilateral aka stump sites. The right stump wound was covered with Aquacel and gauze. The left stump site was covered with xeroform,gauze,and abd. Patient was advise to change right stump wound every other day. Patient will return in 1 week.

## 2023-05-21 ENCOUNTER — Other Ambulatory Visit: Payer: Self-pay

## 2023-05-23 ENCOUNTER — Encounter (INDEPENDENT_AMBULATORY_CARE_PROVIDER_SITE_OTHER): Payer: 59

## 2023-05-23 ENCOUNTER — Telehealth (INDEPENDENT_AMBULATORY_CARE_PROVIDER_SITE_OTHER): Payer: Self-pay
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
## 2023-05-23 NOTE — Telephone Encounter (Signed)
Patient was advised that she should be seen at the ED for further evaluation. Patient requested appointment has been schedule. Patient verbalized understanding to medical recommendations.

## 2023-05-25 ENCOUNTER — Ambulatory Visit (INDEPENDENT_AMBULATORY_CARE_PROVIDER_SITE_OTHER): Payer: 59 | Admitting: Nurse Practitioner

## 2023-05-25 ENCOUNTER — Other Ambulatory Visit (INDEPENDENT_AMBULATORY_CARE_PROVIDER_SITE_OTHER): Payer: Self-pay | Admitting: Nurse Practitioner

## 2023-05-25 ENCOUNTER — Encounter (INDEPENDENT_AMBULATORY_CARE_PROVIDER_SITE_OTHER): Payer: Self-pay

## 2023-05-25 VITALS — BP 111/72 | HR 65 | Resp 16

## 2023-05-25 DIAGNOSIS — Z89611 Acquired absence of right leg above knee: Secondary | ICD-10-CM

## 2023-05-25 NOTE — Progress Notes (Unsigned)
Patient was seen today for wound dressing changes for bilateral above knee amputation sites. The right above knee amputation site was cleaned and I applied Aquacel with guaze. The left above knee amputation site was culture due to drainage with odor. I applied xeroform,guaze,abd over incision. Patient will return next week.

## 2023-05-26 ENCOUNTER — Encounter (INDEPENDENT_AMBULATORY_CARE_PROVIDER_SITE_OTHER): Payer: Self-pay | Admitting: Nurse Practitioner

## 2023-06-01 ENCOUNTER — Encounter (INDEPENDENT_AMBULATORY_CARE_PROVIDER_SITE_OTHER): Payer: Self-pay

## 2023-06-01 ENCOUNTER — Ambulatory Visit (INDEPENDENT_AMBULATORY_CARE_PROVIDER_SITE_OTHER): Payer: 59 | Admitting: Nurse Practitioner

## 2023-06-01 ENCOUNTER — Telehealth (INDEPENDENT_AMBULATORY_CARE_PROVIDER_SITE_OTHER): Payer: Self-pay | Admitting: Nurse Practitioner

## 2023-06-01 ENCOUNTER — Other Ambulatory Visit (INDEPENDENT_AMBULATORY_CARE_PROVIDER_SITE_OTHER): Payer: Self-pay | Admitting: Nurse Practitioner

## 2023-06-01 VITALS — BP 123/70 | HR 72 | Resp 16

## 2023-06-01 DIAGNOSIS — Z89611 Acquired absence of right leg above knee: Secondary | ICD-10-CM

## 2023-06-01 MED ORDER — SULFAMETHOXAZOLE-TRIMETHOPRIM 800-160 MG PO TABS: 1.0000 | ORAL_TABLET | Freq: Two times a day (BID) | ORAL | 0 refills | Status: DC

## 2023-06-01 MED ORDER — AMOXICILLIN-POT CLAVULANATE 875-125 MG PO TABS: 1.0000 | ORAL_TABLET | Freq: Two times a day (BID) | ORAL | 0 refills | Status: DC
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
## 2023-06-01 NOTE — Progress Notes (Unsigned)
Patient came in today for bilateral above knee amputation wound check. The wound to right above knee site has decreased in size. The right aka wound was covered with Aquacel and gauze. Patient will continue changing right stump wound every other day until next follow up. The left stump incision was cleaned,then covered with xeroform,gauze, and tape. Patient will return next week for next wound check.

## 2023-06-02 ENCOUNTER — Encounter (INDEPENDENT_AMBULATORY_CARE_PROVIDER_SITE_OTHER): Payer: Self-pay | Admitting: Nurse Practitioner

## 2023-06-08 ENCOUNTER — Ambulatory Visit (INDEPENDENT_AMBULATORY_CARE_PROVIDER_SITE_OTHER): Payer: 59 | Admitting: Nurse Practitioner

## 2023-06-08 ENCOUNTER — Encounter (INDEPENDENT_AMBULATORY_CARE_PROVIDER_SITE_OTHER): Payer: Self-pay

## 2023-06-08 DIAGNOSIS — S78112A Complete traumatic amputation at level between left hip and knee, initial encounter: Secondary | ICD-10-CM

## 2023-06-08 NOTE — Progress Notes (Unsigned)
Patient came in for right and left above knee amputation wound dressing change. The right above knee wound has healed and no dressing is needed at this time. The left above knee amputation incision drainage has decreased and has no odor. The incision was cleaned and covered with xeroform,abd,with tape. The patient will received a second round of antibiotic. Patient is schedule to return next week.

## 2023-06-10 ENCOUNTER — Encounter (INDEPENDENT_AMBULATORY_CARE_PROVIDER_SITE_OTHER): Payer: Self-pay | Admitting: Nurse Practitioner

## 2023-06-10 NOTE — Addendum Note (Signed)
Addended by: Georgiana Spinner on: 06/10/2023 11:44 PM   Modules accepted: Level of Service

## 2023-06-12 ENCOUNTER — Ambulatory Visit (INDEPENDENT_AMBULATORY_CARE_PROVIDER_SITE_OTHER): Payer: 59 | Admitting: Vascular Surgery

## 2023-06-12 ENCOUNTER — Encounter (INDEPENDENT_AMBULATORY_CARE_PROVIDER_SITE_OTHER): Payer: Self-pay | Admitting: Vascular Surgery

## 2023-06-12 VITALS — BP 136/79 | HR 70 | Resp 18 | Ht 62.0 in | Wt 180.0 lb

## 2023-06-12 DIAGNOSIS — G894 Chronic pain syndrome: Secondary | ICD-10-CM

## 2023-06-12 DIAGNOSIS — I7090 Unspecified atherosclerosis: Secondary | ICD-10-CM

## 2023-06-12 DIAGNOSIS — T8740 Infection of amputation stump, unspecified extremity: Secondary | ICD-10-CM

## 2023-06-12 DIAGNOSIS — I10 Essential (primary) hypertension: Secondary | ICD-10-CM

## 2023-06-12 DIAGNOSIS — Z89611 Acquired absence of right leg above knee: Secondary | ICD-10-CM

## 2023-06-12 NOTE — Assessment & Plan Note (Signed)
Infection has been cleared and stop on the left leg has now healed.  Sutures were removed today.  Follow-up in 3 to 4 weeks.

## 2023-06-12 NOTE — Assessment & Plan Note (Signed)
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  Her blood vessels were not unreconstructable and she is now status post bilateral above-knee amputation.

## 2023-06-12 NOTE — Assessment & Plan Note (Signed)
She has ongoing chronic pain and at some point may need to be managed predominantly by the pain clinic.

## 2023-06-12 NOTE — Assessment & Plan Note (Signed)
Stable

## 2023-06-12 NOTE — Progress Notes (Signed)
MRN : [id number]  Carla [name] is a 62 y.o. (02-08-1961) female who presents with chief complaint of  Chief Complaint  Patient presents with   Follow-up    wound check  .  History of Present Illness: Patient returns today in follow up of her left above-knee amputation site wound.  Her wound is actually doing quite well.  She is still having a lot of pain.  She denies any fever or chills.  There is been minimal drainage from the wound.  No signs of erythema.  The pain is most noticeable laterally.  She has completed her antibiotic therapy.  She remains on anticoagulation for her hypercoagulable state.  Current Outpatient Medications  Medication Sig Dispense Refill   amoxicillin-clavulanate (AUGMENTIN) 875-125 MG tablet Take 1 tablet by mouth 2 (two) times daily. 20 tablet 0   apixaban (ELIQUIS) 5 MG TABS tablet Take 1 tablet (5 mg total) by mouth 2 (two) times daily. 60 tablet 11   ascorbic acid (VITAMIN C) 500 MG tablet Take 1 tablet (500 mg total) by mouth 2 (two) times daily. 30 tablet 0   atorvastatin (LIPITOR) 80 MG tablet Take 80 mg by mouth at bedtime.     COMBIVENT RESPIMAT 20-100 MCG/ACT AERS respimat Inhale 1 puff into the lungs every 6 (six) hours as needed.     diphenoxylate-atropine (LOMOTIL) 2.5-0.025 MG tablet Take 1 tablet by mouth 4 (four) times daily.     esomeprazole (NEXIUM) 20 MG capsule Take 20 mg by mouth daily.     feeding supplement (ENSURE ENLIVE / ENSURE PLUS) LIQD Take 237 mLs by mouth 2 (two) times daily between meals. 237 mL 12   folic acid (FOLVITE) 1 MG tablet Take 1 tablet (1 mg total) by mouth daily. 30 tablet 0   isosorbide mononitrate (IMDUR) 120 MG 24 hr tablet Take 1 tablet (120 mg total) by mouth daily. 30 tablet 1   metoprolol tartrate (LOPRESSOR) 25 MG tablet Take 0.5 tablets (12.5 mg total) by mouth 2 (two) times daily. 30 tablet 0   midodrine (PROAMATINE) 2.5 MG tablet Take 1 tablet (2.5 mg total) by mouth 3 (three) times daily with meals. 90  tablet 0   nystatin cream (MYCOSTATIN) Apply topically 2 (two) times daily. 30 g 0   oxyCODONE (ROXICODONE) 15 MG immediate release tablet Take 1 tablet (15 mg total) by mouth every 6 (six) hours as needed for breakthrough pain or severe pain. 16 tablet 0   polyethylene glycol (MIRALAX / GLYCOLAX) 17 g packet Take 17 g by mouth daily as needed for severe constipation. 30 each 0   pregabalin (LYRICA) 200 MG capsule Take 1 capsule (200 mg total) by mouth in the morning, at noon, and at bedtime. 90 capsule 3   sulfamethoxazole-trimethoprim (BACTRIM DS) 800-160 MG tablet Take 1 tablet by mouth 2 (two) times daily. 20 tablet 0   zinc sulfate 220 (50 Zn) MG capsule Take 1 capsule (220 mg total) by mouth daily. 30 capsule 0   ferrous sulfate 325 (65 FE) MG tablet Take 325 mg by mouth daily. (Patient not taking: Reported on 04/30/2023)     Current Facility-Administered Medications  Medication Dose Route Frequency Provider Last Rate Last Admin   Vitamin D (Ergocalciferol) (DRISDOL) 1.25 MG (50000 UNIT) capsule 50,000 Units  50,000 Units Oral Once per day on Mon Wed Fri Pace, Brien R, NP        Past Medical History:  Diagnosis Date   Abdominal aortic atherosclerosis (HCC)  a. 05/2017 CTA abd/pelvis: significant atherosclerotic dzs of the infrarenal abd Ao w/ some mural thrombus. No aneurysm or dissection.   Acute focal ischemia of small intestine (HCC)    Acute right-sided low back pain with right-sided sciatica 06/13/2017   AKI (acute kidney injury) (HCC) 07/29/2017   Anemia    Anginal pain (HCC)    a. 08/2012 Lexiscan MV: EF 54%, non ischemia/infarct.   Anxiety and depression    Baker's cyst of knee, right    a. 07/2016 U/S: 4.1 x 1.4 x 2.9 cystic structure in R poplitetal fossa.   Bell's palsy    CAD (coronary artery disease)    CHF (congestive heart failure) (HCC)    a.) TTE 06/20/2017: EF 60-65%, no rwma, G1DD, no source of cardiac emboli.   Chronic anticoagulation    a.) apixaban   Chronic  idiopathic constipation    Chronic mesenteric ischemia (HCC)    Chronic pain    COPD (chronic obstructive pulmonary disease) (HCC)    Dyspnea    Embolus of superior mesenteric artery (HCC)    a. 05/2017 CTA Abd/pelvis: apparent thrombus or embolus in prox SMA (70-90%); b. 05/2017 catheter directed tPA, mechanical thrombectomy, and stenting of the SMA.   Essential hypertension with goal blood pressure less than 140/90 01/19/2016   Gastroesophageal reflux disease 01/19/2016   GERD (gastroesophageal reflux disease)    H/O colectomy    History of 2019 novel coronavirus disease (COVID-19) 11/14/2019   History of kidney stones    Hyperlipidemia    Hypertension    Morbid obesity with BMI of 40.0-44.9, adult (HCC)    NSTEMI (non-ST elevated myocardial infarction) (HCC) 06/27/2021   a.) high sensitivity troponins trended: 893 --> 1587 --> 1748 --> 868 --> 735 ng/L   Occlusive mesenteric ischemia (HCC) 06/19/2017   Osteoarthritis    PAD (peripheral artery disease) (HCC)    PAF (paroxysmal atrial fibrillation) (HCC)    a.) CHA2DS2-VASc = 6 (sex, CHF, HTN, DVT x2, aortic plaque). b.) rate/rhythm maintained on oral metoprolol tartrate; chronically anticoagulated with standard dose apixaban.   Palpitations    Peripheral edema    Personal history of other malignant neoplasm of skin 06/01/2016   Pneumonia    Pre-diabetes    Primary osteoarthritis of both knees 06/13/2017   SBO (small bowel obstruction) (HCC) 08/06/2017   Skin cancer of nose    Superior mesenteric artery thrombosis (HCC)    Vitamin D deficiency     Past Surgical History:  Procedure Laterality Date   AMPUTATION Right 04/06/2022   Procedure: AMPUTATION ABOVE KNEE;  Surgeon: Annice Needy, MD;  Location: [facility];  Service: Vascular;  Laterality: Right;   AMPUTATION  12/07/2022   Procedure: AMPUTATION ABOVE KNEE REVISION WITH RESECTION OF FEMUR;  Surgeon: Annice Needy, MD;  Location: [facility];  Service: Vascular;;   AMPUTATION Left  02/21/2023   Procedure: AMPUTATION ABOVE KNEE;  Surgeon: Annice Needy, MD;  Location: [facility];  Service: General;  Laterality: Left;   AMPUTATION Left 03/22/2023   Procedure: AMPUTATION ABOVE KNEE STUMP DEBIDEMENT;  Surgeon: Annice Needy, MD;  Location: [facility];  Service: Vascular;  Laterality: Left;   AMPUTATION TOE Left 12/09/2019   Procedure: AMPUTATION TOE MPJ left;  Surgeon: Rosetta Posner, DPM;  Location: [facility];  Service: Podiatry;  Laterality: Left;   APPENDECTOMY     APPLICATION OF WOUND VAC  12/01/2021   Procedure: APPLICATION OF WOUND VAC;  Surgeon: Annice Needy, MD;  Location: [facility];  Service: Vascular;;   APPLICATION OF WOUND VAC Right 05/03/2022   Procedure: APPLICATION OF WOUND VAC;  Surgeon: Annice Needy, MD;  Location: [facility];  Service: Vascular;  Laterality: Right;   APPLICATION OF WOUND VAC Left 07/23/2022   Procedure: APPLICATION OF WOUND VAC;  Surgeon: Nada Libman, MD;  Location: [facility];  Service: Vascular;  Laterality: Left;  Prevena   APPLICATION OF WOUND VAC Right 08/28/2022   Procedure: APPLICATION OF WOUND VAC;  Surgeon: Annice Needy, MD;  Location: [facility];  Service: Vascular;  Laterality: Right;   APPLICATION OF WOUND VAC  12/07/2022   Procedure: APPLICATION OF WOUND VAC;  Surgeon: Annice Needy, MD;  Location: [facility];  Service: Vascular;;   APPLICATION OF WOUND VAC Left 03/28/2023   Procedure: LEFT STUMP WOUND VAC EXCHANGE;  Surgeon: Annice Needy, MD;  Location: [facility];  Service: Vascular;  Laterality: Left;   APPLICATION OF WOUND VAC Left 04/02/2023   Procedure: LEFT STUMP WOUND VAC EXCHANGE;  Surgeon: Annice Needy, MD;  Location: [facility];  Service: Vascular;  Laterality: Left;   APPLICATION OF WOUND VAC Left 04/05/2023   Procedure: LEFT STUMP WOUND VAC EXCHANGE;  Surgeon: Annice Needy, MD;  Location: [facility];  Service: Vascular;  Laterality: Left;   APPLICATION OF WOUND VAC Left 04/09/2023   Procedure: LEFT STUMP WOUND VAC EXCHANGE;  Surgeon: Annice Needy, MD;  Location: [facility];  Service: Vascular;  Laterality: Left;   APPLICATION OF WOUND VAC Left 04/16/2023   Procedure: LEFT STUMP WOUND VAC EXCHANGE;  Surgeon: Annice Needy, MD;  Location: [facility];  Service: Vascular;  Laterality: Left;   APPLICATION OF WOUND VAC Left 04/23/2023   Procedure: LEFT STUMP WOUND VAC EXCHANGE;  Surgeon: Renford Dills, MD;  Location: [facility];  Service: Vascular;  Laterality: Left;   APPLICATION OF WOUND VAC Left 04/30/2023   Procedure: LEFT STUMP WOUND VAC EXCHANGE;  Surgeon: Annice Needy, MD;  Location: [facility];  Service: Vascular;  Laterality: Left;   AXILLARY-FEMORAL BYPASS GRAFT Left 07/23/2022   Procedure: BYPASS GRAFT AXILLA-BIFEMORAL;  Surgeon: Nada Libman, MD;  Location: [facility];  Service: Vascular;  Laterality: Left;   CHOLECYSTECTOMY     COLON SURGERY     COLONOSCOPY WITH PROPOFOL N/A 03/23/2022   Procedure: COLONOSCOPY WITH PROPOFOL;  Surgeon: Wyline Mood, MD;  Location: [facility];  Service: Gastroenterology;  Laterality: N/A;  rectal bleed   CORONARY/GRAFT ACUTE MI REVASCULARIZATION N/A 02/15/2023   Procedure: Coronary/Graft Acute MI Revascularization;  Surgeon: Marykay Lex, MD;  Location: [facility];  Service: Cardiovascular;  Laterality: N/A;   EMBOLECTOMY OR THROMBECTOMY, WITH OR WITHOUT CATHETER; FEMOROPOPLITEAL, AORTOILIAC ARTERY, BY LEG INCISION N/A 06/21/2020   Left - Decomp Fasciotomy Leg; Ant &/Or Lat Compart Only; Midline - Angiography, Visceral, Selective Or Supraselective (With Or Without Flush Aortogram); Left - Revascularize, Endovasc, Open/Percut, Iliac Artery, Unilat, Initial Vessel; W/Translum Stent, W/Angioplasty; Right - Revascularize, Endovasc, Open/Percut, Iliac Artery, Ea Add`L Ipsilateral; W/Translumin Stent, W/Angioplasty; Location [facility];   ENDARTERECTOMY FEMORAL Left 12/01/2021   Procedure: ENDARTERECTOMY FEMORAL;  Surgeon: Annice Needy, MD;  Location: [facility];  Service: Vascular;  Laterality: Left;    ENDOVASCULAR REPAIR/STENT GRAFT Left 12/01/2021   Procedure: ENDOVASCULAR REPAIR/STENT GRAFT;  Surgeon: Annice Needy, MD;  Location: [facility];  Service: Cardiovascular;  Laterality: Left;   INCISION AND DRAINAGE OF WOUND Right 05/03/2022   Procedure: IRRIGATION AND DEBRIDEMENT RIGHT  STUMP;  Surgeon: Annice Needy, MD;  Location: [facility];  Service: Vascular;  Laterality: Right;   IRRIGATION AND DEBRIDEMENT ABSCESS Right 08/28/2022   Procedure: AKA IRRIGATION AND DEBRIDEMENT ABSCESS;  Surgeon: Annice Needy, MD;  Location: [facility];  Service: Vascular;  Laterality: Right;   LAPAROTOMY N/A 08/08/2017   Procedure: EXPLORATORY LAPAROTOMY POSSIBLE BOWEL RESECTION;  Surgeon: Leafy Ro, MD;  Location: [facility];  Service: General;  Laterality: N/A;   LEFT HEART CATH AND CORONARY ANGIOGRAPHY N/A 05/17/2021   Procedure: LEFT HEART CATH AND CORONARY ANGIOGRAPHY;  Surgeon: Alwyn Pea, MD;  Location: [facility];  Service: Cardiovascular;  Laterality: N/A;   LEFT HEART CATH AND CORONARY ANGIOGRAPHY N/A 02/15/2023   Procedure: LEFT HEART CATH AND CORONARY ANGIOGRAPHY;  Surgeon: Marykay Lex, MD;  Location: [facility];  Service: Cardiovascular;  Laterality: N/A;   LOWER EXTREMITY ANGIOGRAPHY Left 11/17/2019   Procedure: LOWER EXTREMITY ANGIOGRAPHY;  Surgeon: Annice Needy, MD;  Location: [facility];  Service: Cardiovascular;  Laterality: Left;   LOWER EXTREMITY ANGIOGRAPHY Right 12/13/2020   Procedure: LOWER EXTREMITY ANGIOGRAPHY;  Surgeon: Annice Needy, MD;  Location: [facility];  Service: Cardiovascular;  Laterality: Right;   LOWER EXTREMITY ANGIOGRAPHY Right 01/10/2021   Procedure: LOWER EXTREMITY ANGIOGRAPHY;  Surgeon: Annice Needy, MD;  Location: [facility];  Service: Cardiovascular;  Laterality: Right;   LOWER EXTREMITY ANGIOGRAPHY Right 01/24/2021   Procedure: LOWER EXTREMITY ANGIOGRAPHY;  Surgeon: Annice Needy, MD;  Location: [facility];  Service: Cardiovascular;  Laterality: Right;   LOWER EXTREMITY ANGIOGRAPHY Bilateral 06/28/2021   Procedure: Lower Extremity Angiography;  Surgeon: Annice Needy, MD;  Location: [facility];  Service: Cardiovascular;  Laterality: Bilateral;   LOWER EXTREMITY ANGIOGRAPHY Right 11/28/2021   Procedure: LOWER EXTREMITY ANGIOGRAPHY;  Surgeon: Annice Needy, MD;  Location: [facility];  Service: Cardiovascular;  Laterality: Right;   LOWER EXTREMITY ANGIOGRAPHY Left 11/30/2021   Procedure: Lower Extremity Angiography;  Surgeon: Annice Needy, MD;  Location: [facility];  Service: Cardiovascular;  Laterality: Left;   LOWER EXTREMITY ANGIOGRAPHY Right 02/22/2022   Procedure: Lower Extremity Angiography;  Surgeon: Annice Needy, MD;  Location: [facility];  Service: Cardiovascular;  Laterality: Right;   LOWER EXTREMITY ANGIOGRAPHY Right 03/13/2022   Procedure: Lower Extremity Angiography;  Surgeon: Annice Needy, MD;  Location: [facility];  Service: Cardiovascular;  Laterality: Right;   LOWER EXTREMITY ANGIOGRAPHY Right 03/24/2022   Procedure: Lower Extremity Angiography;  Surgeon: Annice Needy, MD;  Location: [facility];  Service: Cardiovascular;  Laterality: Right;   LOWER EXTREMITY ANGIOGRAPHY Left 01/17/2023   Procedure: Lower Extremity Angiography;  Surgeon: Annice Needy, MD;  Location: [facility];  Service: Cardiovascular;  Laterality: Left;   LOWER EXTREMITY INTERVENTION N/A 11/19/2019   Procedure: LOWER EXTREMITY INTERVENTION;  Surgeon: Annice Needy, MD;  Location: [facility];  Service: Cardiovascular;  Laterality: N/A;   LOWER EXTREMITY INTERVENTION Bilateral 06/29/2021   Procedure: LOWER EXTREMITY INTERVENTION;  Surgeon: Annice Needy, MD;  Location: [facility];  Service: Cardiovascular;  Laterality: Bilateral;   LOWER EXTREMITY INTERVENTION Left 01/18/2023   Procedure: LOWER EXTREMITY INTERVENTION;  Surgeon: Annice Needy, MD;  Location: [facility];  Service: Cardiovascular;  Laterality: Left;   TEE WITHOUT CARDIOVERSION N/A 06/22/2017   Procedure: TRANSESOPHAGEAL ECHOCARDIOGRAM (  TEE);  Surgeon: Iran Ouch, MD;  Location: [facility];  Service: Cardiovascular;  Laterality: N/A;   TOTAL KNEE ARTHROPLASTY Right 07/11/2018   Procedure: TOTAL KNEE ARTHROPLASTY;  Surgeon: Christena Flake, MD;  Location: [facility];  Service: Orthopedics;  Laterality: Right;   VAGINAL HYSTERECTOMY     VISCERAL ARTERY INTERVENTION N/A 06/20/2017   Procedure: Visceral Artery Intervention, possible aortic thrombectomy;  Surgeon: Annice Needy, MD;  Location: [facility];  Service: Cardiovascular;  Laterality: N/A;   VISCERAL ARTERY INTERVENTION N/A 01/28/2018   Procedure: VISCERAL ARTERY INTERVENTION;  Surgeon: Annice Needy, MD;  Location: [facility];  Service: Cardiovascular;  Laterality: N/A;     Social History   Tobacco Use   Smoking status: Former    Current packs/day: 0.00    Types: Cigarettes    Quit date: 2023    Years since quitting: 1.6   Smokeless tobacco: Never  Vaping Use   Vaping status: Never Used  Substance Use Topics   Alcohol use: No   Drug use: No       Family History  Problem Relation Age of Onset   Hypertension Mother    Heart disease Mother    Heart attack Mother    Breast cancer Mother    Heart disease Father    Hypertension Father    Breast cancer Paternal Aunt      Allergies  Allergen Reactions   Gabapentin     Tremors    Bactrim [Sulfamethoxazole-Trimethoprim] Hives, Itching and Other (See Comments)    Family member states she has burning sensation from the inside out     REVIEW OF SYSTEMS (Negative unless checked)  Constitutional: [] Weight loss  [] Fever  [] Chills Cardiac: [] Chest pain   [] Chest pressure   [] Palpitations   [] Shortness of breath when laying flat   [] Shortness of breath at rest   [x] Shortness of breath with exertion. Vascular:   [] Pain in legs with walking   [x] Pain in legs at rest   [x] Pain in legs when laying flat   [] Claudication   [] Pain in feet when walking  [] Pain in feet at rest  [] Pain in feet when laying flat   [] History of DVT   [] Phlebitis   [] Swelling in legs   [] Varicose veins   [] Non-healing ulcers Pulmonary:   [] Uses home oxygen   [] Productive cough   [] Hemoptysis   [] Wheeze  [] COPD   [] Asthma Neurologic:  [] Dizziness  [] Blackouts   [] Seizures   [] History of stroke   [] History of TIA  [] Aphasia   [] Temporary blindness   [] Dysphagia   [] Weakness or numbness in arms   [] Weakness or numbness in legs Musculoskeletal:  [] Arthritis   [] Joint swelling   [x] Joint pain   [] Low back pain Hematologic:  [] Easy bruising  [] Easy bleeding   [x] Hypercoagulable state   [] Anemic   Gastrointestinal:  [] Blood in stool   [] Vomiting blood  [] Gastroesophageal reflux/heartburn   [] Abdominal pain Genitourinary:  [] Chronic kidney disease   [] Difficult urination  [] Frequent urination  [] Burning with urination   [] Hematuria Skin:  [] Rashes   [x] Ulcers   [x] Wounds Psychological:  [x] History of anxiety   [x]  History of major depression.  Physical Examination  BP 136/79 (BP Location: Left Wrist)   Pulse 70   Resp 18   Ht 5\' 2"  (1.575 m)   Wt 180 lb (81.6 kg)   BMI 32.92 kg/m  Gen:  WD/WN, NAD Head: [place]/AT, No temporalis wasting. Ear/Nose/Throat: Hearing grossly intact, nares w/o  erythema or drainage Eyes: Conjunctiva clear. Sclera non-icteric Neck: Supple.  Trachea midline Pulmonary:  Good air movement, no use of accessory muscles.  Cardiac: RRR, no JVD Vascular:  Vessel Right Left  Radial Palpable Palpable                          PT Not palpable Not palpable  DP Not palpable Not palpable   Musculoskeletal: M/S 5/5 throughout.  Bilateral above-knee amputations are present. Neurologic: Sensation grossly intact in extremities.  Symmetrical.  Speech is fluent.  Psychiatric: Judgment intact, Mood & affect appropriate for  pt's clinical situation. Dermatologic: No rashes or ulcers noted.  Left above-knee amputation site has healed and sutures were removed today.  No erythema or drainage.      Labs Recent Results (from the past 2160 hour(s))  CBC     Status: Abnormal   Collection Time: 03/21/23  2:12 PM  Result Value Ref Range   WBC 7.5 4.0 - 10.5 K/uL   RBC 4.77 3.87 - 5.11 MIL/uL   Hemoglobin 12.8 12.0 - 15.0 g/dL   HCT 81.1 91.4 - 78.2 %   MCV 85.7 80.0 - 100.0 fL   MCH 26.8 26.0 - 34.0 pg   MCHC 31.3 30.0 - 36.0 g/dL   RDW 95.6 (H) 21.3 - 08.6 %   Platelets 421 (H) 150 - 400 K/uL   nRBC 0.0 0.0 - 0.2 %    Comment: Performed at [facility], [address], [place], [place] [address]  Basic metabolic panel     Status: Abnormal   Collection Time: 03/21/23  2:12 PM  Result Value Ref Range   Sodium 137 135 - 145 mmol/L   Potassium 3.6 3.5 - 5.1 mmol/L   Chloride 104 98 - 111 mmol/L   CO2 19 (L) 22 - 32 mmol/L   Glucose, Bld 121 (H) 70 - 99 mg/dL    Comment: Glucose reference range applies only to samples taken after fasting for at least 8 hours.   BUN 16 8 - 23 mg/dL   Creatinine, Ser 9.62 0.44 - 1.00 mg/dL   Calcium 9.6 8.9 - 95.2 mg/dL   GFR, Estimated >84 >13 mL/min    Comment: (NOTE) Calculated using the CKD-EPI Creatinine Equation (2021)    Anion gap 14 5 - 15    Comment: Performed at [facility], [address] [place], [place] [address]  Blood Culture (routine x 2)     Status: None   Collection Time: 03/21/23  4:21 PM   Specimen: BLOOD  Result Value Ref Range   Specimen Description BLOOD BLOOD RIGHT HAND    Special Requests      BOTTLES DRAWN AEROBIC AND ANAEROBIC Blood Culture adequate volume   Culture      NO GROWTH 5 DAYS Performed at [facility], [address], [place], [place] [address]    Report Status 03/26/2023 FINAL   Lactic acid, plasma     Status: None   Collection Time: 03/21/23  4:27 PM  Result Value Ref Range   Lactic Acid,  Venous 1.2 0.5 - 1.9 mmol/L    Comment: Performed at [facility], [address] [place], [place] [address]  Protime-INR     Status: Abnormal   Collection Time: 03/21/23  4:27 PM  Result Value Ref Range   Prothrombin Time 19.6 (H) 11.4 - 15.2 seconds   INR 1.6 (H) 0.8 - 1.2    Comment: (  NOTE) INR goal varies based on device and disease states. Performed at [facility], [address] [place], [place] [address]   APTT     Status: None   Collection Time: 03/21/23  4:27 PM  Result Value Ref Range   aPTT 34 24 - 36 seconds    Comment: Performed at [facility], [address] [place], [place] [address]  Sedimentation rate     Status: Abnormal   Collection Time: 03/21/23  4:27 PM  Result Value Ref Range   Sed Rate 60 (H) 0 - 30 mm/hr    Comment: Performed at [facility], [address] [place], [place] [address]  C-reactive protein     Status: Abnormal   Collection Time: 03/21/23  4:27 PM  Result Value Ref Range   CRP 1.2 (H) <1.0 mg/dL    Comment: Performed at [facility], [address], [place], [place] [address]  Blood Culture (routine x 2)     Status: None   Collection Time: 03/21/23  4:49 PM   Specimen: BLOOD  Result Value Ref Range   Specimen Description BLOOD BLOOD LEFT HAND    Special Requests      BOTTLES DRAWN AEROBIC AND ANAEROBIC Blood Culture results may not be optimal due to an inadequate volume of blood received in culture bottles   Culture      NO GROWTH 5 DAYS Performed at [facility], [address] [place], [place] [address]    Report Status 03/26/2023 FINAL   Basic metabolic panel     Status: Abnormal   Collection Time: 03/22/23  5:42 AM  Result Value Ref Range   Sodium 140 135 - 145 mmol/L   Potassium 3.5 3.5 - 5.1 mmol/L   Chloride 109 98 - 111 mmol/L   CO2 22 22 - 32 mmol/L   Glucose, Bld 104 (H) 70 - 99 mg/dL    Comment: Glucose reference range applies only to samples taken after  fasting for at least 8 hours.   BUN 13 8 - 23 mg/dL   Creatinine, Ser 0.93 (L) 0.44 - 1.00 mg/dL   Calcium 8.8 (L) 8.9 - 10.3 mg/dL   GFR, Estimated >23 >55 mL/min    Comment: (NOTE) Calculated using the CKD-EPI Creatinine Equation (2021)    Anion gap 9 5 - 15    Comment: Performed at [facility], [address] [place], [place] [address]  CBC     Status: Abnormal   Collection Time: 03/22/23  5:42 AM  Result Value Ref Range   WBC 6.7 4.0 - 10.5 K/uL   RBC 3.88 3.87 - 5.11 MIL/uL   Hemoglobin 10.4 (L) 12.0 - 15.0 g/dL   HCT 25.4 (L) 27.0 - 62.3 %   MCV 87.1 80.0 - 100.0 fL   MCH 26.8 26.0 - 34.0 pg   MCHC 30.8 30.0 - 36.0 g/dL   RDW 76.2 (H) 83.1 - 51.7 %   Platelets 289 150 - 400 K/uL   nRBC 0.0 0.0 - 0.2 %    Comment: Performed at [facility], [address] [place], [place] [address]  Aerobic/Anaerobic Culture w Gram Stain (surgical/deep wound)     Status: None   Collection Time: 03/22/23 11:51 AM   Specimen: Path Tissue  Result Value Ref Range   Specimen Description      TISSUE Performed at [facility], [address] [place], [place] [address]    Special Requests SWAB OF  LEFT AKA STUMP WOUND    Gram Stain      RARE WBC PRESENT,BOTH PMN AND MONONUCLEAR RARE GRAM POSITIVE COCCI RARE GRAM POSITIVE RODS RARE GRAM NEGATIVE RODS    Culture      FEW ESCHERICHIA COLI FEW PROTEUS MIRABILIS Confirmed Extended Spectrum Beta-Lactamase Producer (ESBL).  In bloodstream infections from ESBL organisms, carbapenems are preferred over piperacillin/tazobactam. They are shown to have a lower risk of mortality. RARE DIPHTHEROIDS(CORYNEBACTERIUM SPECIES) Standardized susceptibility testing for this organism is not available. NO ANAEROBES ISOLATED Performed at [facility], [address] [place] [address] [place], [place] [address]    Report Status 03/27/2023 FINAL    Organism ID, Bacteria ESCHERICHIA COLI    Organism ID, Bacteria PROTEUS MIRABILIS        Susceptibility   Escherichia coli - MIC*    AMPICILLIN >=32 RESISTANT Resistant     CEFEPIME >=32 RESISTANT Resistant     CEFTAZIDIME >=64 RESISTANT Resistant     CEFTRIAXONE >=64 RESISTANT Resistant     CIPROFLOXACIN >=4 RESISTANT Resistant     GENTAMICIN <=1 SENSITIVE Sensitive     IMIPENEM <=0.25 SENSITIVE Sensitive     TRIMETH/SULFA >=320 RESISTANT Resistant     AMPICILLIN/SULBACTAM 16 INTERMEDIATE Intermediate     PIP/TAZO 64 INTERMEDIATE Intermediate     * FEW ESCHERICHIA COLI   Proteus mirabilis - MIC*    AMPICILLIN <=2 SENSITIVE Sensitive     CEFEPIME <=0.12 SENSITIVE Sensitive     CEFTAZIDIME <=1 SENSITIVE Sensitive     CEFTRIAXONE <=0.25 SENSITIVE Sensitive     CIPROFLOXACIN <=0.25 SENSITIVE Sensitive     GENTAMICIN <=1 SENSITIVE Sensitive     IMIPENEM 4 SENSITIVE Sensitive     TRIMETH/SULFA <=20 SENSITIVE Sensitive     AMPICILLIN/SULBACTAM <=2 SENSITIVE Sensitive     PIP/TAZO <=4 SENSITIVE Sensitive     * FEW PROTEUS MIRABILIS  CBC     Status: Abnormal   Collection Time: 03/23/23  4:15 AM  Result Value Ref Range   WBC 6.5 4.0 - 10.5 K/uL   RBC 3.57 (L) 3.87 - 5.11 MIL/uL   Hemoglobin 9.7 (L) 12.0 - 15.0 g/dL   HCT 54.2 (L) 70.6 - 23.7 %   MCV 86.8 80.0 - 100.0 fL   MCH 27.2 26.0 - 34.0 pg   MCHC 31.3 30.0 - 36.0 g/dL   RDW 62.8 31.5 - 17.6 %   Platelets 264 150 - 400 K/uL   nRBC 0.0 0.0 - 0.2 %    Comment: Performed at [facility], [address] [place], [place] [address]  Basic metabolic panel     Status: Abnormal   Collection Time: 03/23/23  4:15 AM  Result Value Ref Range   Sodium 140 135 - 145 mmol/L   Potassium 3.1 (L) 3.5 - 5.1 mmol/L   Chloride 112 (H) 98 - 111 mmol/L   CO2 20 (L) 22 - 32 mmol/L   Glucose, Bld 152 (H) 70 - 99 mg/dL    Comment: Glucose reference range applies only to samples taken after fasting for at least 8 hours.   BUN 10 8 - 23 mg/dL   Creatinine, Ser 7.10 (L) 0.44 - 1.00 mg/dL   Calcium 8.6 (L) 8.9 - 10.3 mg/dL    GFR, Estimated >62 >69 mL/min    Comment: (NOTE) Calculated using the CKD-EPI Creatinine Equation (2021)    Anion gap 8 5 - 15    Comment: Performed at [facility], [address], [place], [place] [address]  Magnesium     Status: None   Collection Time: 03/23/23  4:15 AM  Result Value Ref Range   Magnesium 1.8 1.7 - 2.4 mg/dL    Comment: Performed at [facility], [address] [place], [place] [address]  Basic metabolic panel     Status: Abnormal   Collection Time: 03/24/23  4:31 AM  Result Value Ref Range   Sodium 139 135 - 145 mmol/L   Potassium 4.1 3.5 - 5.1 mmol/L   Chloride 111 98 - 111 mmol/L   CO2 23 22 - 32 mmol/L   Glucose, Bld 119 (H) 70 - 99 mg/dL    Comment: Glucose reference range applies only to samples taken after fasting for at least 8 hours.   BUN 17 8 - 23 mg/dL   Creatinine, Ser 4.69 0.44 - 1.00 mg/dL   Calcium 8.4 (L) 8.9 - 10.3 mg/dL   GFR, Estimated >62 >95 mL/min    Comment: (NOTE) Calculated using the CKD-EPI Creatinine Equation (2021)    Anion gap 5 5 - 15    Comment: Performed at [facility], [address] [place], [place] [address]  CBC     Status: Abnormal   Collection Time: 03/25/23  4:07 AM  Result Value Ref Range   WBC 6.2 4.0 - 10.5 K/uL   RBC 3.49 (L) 3.87 - 5.11 MIL/uL   Hemoglobin 9.4 (L) 12.0 - 15.0 g/dL   HCT 24.4 (L) 01.0 - 27.2 %   MCV 88.5 80.0 - 100.0 fL   MCH 26.9 26.0 - 34.0 pg   MCHC 30.4 30.0 - 36.0 g/dL   RDW 53.6 (H) 64.4 - 03.4 %   Platelets 283 150 - 400 K/uL   nRBC 0.0 0.0 - 0.2 %    Comment: Performed at [facility], [address] [place], [place] [address]  Creatinine, serum     Status: None   Collection Time: 03/25/23  2:59 PM  Result Value Ref Range   Creatinine, Ser 0.53 0.44 - 1.00 mg/dL   GFR, Estimated >56 >38 mL/min    Comment: (NOTE) Calculated using the CKD-EPI Creatinine Equation (2021) Performed at [facility], [address]  [place], [place] [address]   CBC     Status: Abnormal   Collection Time: 03/27/23  4:22 AM  Result Value Ref Range   WBC 8.2 4.0 - 10.5 K/uL   RBC 3.34 (L) 3.87 - 5.11 MIL/uL   Hemoglobin 8.8 (L) 12.0 - 15.0 g/dL   HCT 32.9 (L) 51.8 - 84.1 %   MCV 88.3 80.0 - 100.0 fL   MCH 26.3 26.0 - 34.0 pg   MCHC 29.8 (L) 30.0 - 36.0 g/dL   RDW 66.0 (H) 63.0 - 16.0 %   Platelets 260 150 - 400 K/uL   nRBC 0.0 0.0 - 0.2 %    Comment: Performed at [facility], [address] [place], [place] [address]  Vitamin B12     Status: None   Collection Time: 03/27/23  4:22 AM  Result Value Ref Range   Vitamin B-12 234 180 - 914 pg/mL    Comment: (NOTE) This assay is not validated for testing neonatal or myeloproliferative syndrome specimens for Vitamin B12 levels. Performed at [facility], [address], [place], [place] [address]   Folate     Status: None   Collection Time: 03/27/23  4:22 AM  Result Value Ref Range   Folate 8.4 >5.9 ng/mL  Comment: Performed at [facility], [address] [place], [place] [address]  Iron and TIBC     Status: Abnormal   Collection Time: 03/27/23  4:22 AM  Result Value Ref Range   Iron 20 (L) 28 - 170 ug/dL   TIBC 604 540 - 981 ug/dL   Saturation Ratios 5 (L) 10.4 - 31.8 %   UIBC 372 ug/dL    Comment: Performed at [facility], [address] [place], [place] [address]  Ferritin     Status: None   Collection Time: 03/27/23  4:22 AM  Result Value Ref Range   Ferritin 14 11 - 307 ng/mL    Comment: Performed at [facility], [address] [place], [place] [address]  Reticulocytes     Status: Abnormal   Collection Time: 03/27/23  4:22 AM  Result Value Ref Range   Retic Ct Pct 2.0 0.4 - 3.1 %   RBC. 3.30 (L) 3.87 - 5.11 MIL/uL   Retic Count, Absolute 65.7 19.0 - 186.0 K/uL   Immature Retic Fract 23.3 (H) 2.3 - 15.9 %    Comment: Performed at [facility], [address] [place], [place] [address]   Basic metabolic panel     Status: Abnormal   Collection Time: 03/27/23  4:22 AM  Result Value Ref Range   Sodium 140 135 - 145 mmol/L   Potassium 4.3 3.5 - 5.1 mmol/L   Chloride 105 98 - 111 mmol/L   CO2 25 22 - 32 mmol/L   Glucose, Bld 122 (H) 70 - 99 mg/dL    Comment: Glucose reference range applies only to samples taken after fasting for at least 8 hours.   BUN 14 8 - 23 mg/dL   Creatinine, Ser 6.57 0.44 - 1.00 mg/dL   Calcium 8.6 (L) 8.9 - 10.3 mg/dL   GFR, Estimated >84 >69 mL/min    Comment: (NOTE) Calculated using the CKD-EPI Creatinine Equation (2021)    Anion gap 10 5 - 15    Comment: Performed at [facility], [address] [place], [place] [address]  CBC     Status: Abnormal   Collection Time: 03/28/23  4:25 AM  Result Value Ref Range   WBC 8.0 4.0 - 10.5 K/uL   RBC 3.24 (L) 3.87 - 5.11 MIL/uL   Hemoglobin 8.8 (L) 12.0 - 15.0 g/dL   HCT 84.1 (L) 32.4 - 40.1 %   MCV 87.0 80.0 - 100.0 fL   MCH 27.2 26.0 - 34.0 pg   MCHC 31.2 30.0 - 36.0 g/dL   RDW 02.7 (H) 25.3 - 66.4 %   Platelets 271 150 - 400 K/uL   nRBC 0.0 0.0 - 0.2 %    Comment: Performed at [facility], [address], [place], [place] [address]  Aerobic/Anaerobic Culture w Gram Stain (surgical/deep wound)     Status: None   Collection Time: 03/28/23 10:59 AM   Specimen: Path Tissue  Result Value Ref Range   Specimen Description      TISSUE Performed at [facility], [address] [place], [place] [address]    Special Requests SWAB OF LEFT LEG STUMP    Gram Stain NO WBC SEEN NO ORGANISMS SEEN     Culture      RARE STAPHYLOCOCCUS AUREUS RARE PROTEUS MIRABILIS NO ANAEROBES ISOLATED Performed at [facility], [address] [place], [place] [address]    Report Status 04/02/2023 FINAL    Organism ID,  Bacteria STAPHYLOCOCCUS AUREUS    Organism ID, Bacteria PROTEUS MIRABILIS       Susceptibility   Proteus mirabilis - MIC*    AMPICILLIN <=2 SENSITIVE Sensitive      CEFEPIME <=0.12 SENSITIVE Sensitive     CEFTAZIDIME <=1 SENSITIVE Sensitive     CEFTRIAXONE <=0.25 SENSITIVE Sensitive     CIPROFLOXACIN <=0.25 SENSITIVE Sensitive     GENTAMICIN <=1 SENSITIVE Sensitive     IMIPENEM 2 SENSITIVE Sensitive     TRIMETH/SULFA <=20 SENSITIVE Sensitive     AMPICILLIN/SULBACTAM <=2 SENSITIVE Sensitive     PIP/TAZO <=4 SENSITIVE Sensitive     * RARE PROTEUS MIRABILIS   Staphylococcus aureus - MIC*    CIPROFLOXACIN >=8 RESISTANT Resistant     ERYTHROMYCIN >=8 RESISTANT Resistant     GENTAMICIN <=0.5 SENSITIVE Sensitive     OXACILLIN >=4 RESISTANT Resistant     TETRACYCLINE >=16 RESISTANT Resistant     VANCOMYCIN 1 SENSITIVE Sensitive     TRIMETH/SULFA <=10 SENSITIVE Sensitive     CLINDAMYCIN >=8 RESISTANT Resistant     RIFAMPIN <=0.5 SENSITIVE Sensitive     Inducible Clindamycin NEGATIVE Sensitive     LINEZOLID 2 SENSITIVE Sensitive     * RARE STAPHYLOCOCCUS AUREUS  CK     Status: Abnormal   Collection Time: 03/28/23  4:23 PM  Result Value Ref Range   Total CK 14 (L) 38 - 234 U/L    Comment: Performed at [facility], [address] [place], [place] [address]  CBC     Status: Abnormal   Collection Time: 03/29/23  6:45 AM  Result Value Ref Range   WBC 7.9 4.0 - 10.5 K/uL   RBC 3.78 (L) 3.87 - 5.11 MIL/uL   Hemoglobin 10.2 (L) 12.0 - 15.0 g/dL   HCT 60.4 (L) 54.0 - 98.1 %   MCV 86.8 80.0 - 100.0 fL   MCH 27.0 26.0 - 34.0 pg   MCHC 31.1 30.0 - 36.0 g/dL   RDW 19.1 (H) 47.8 - 29.5 %   Platelets 304 150 - 400 K/uL   nRBC 0.0 0.0 - 0.2 %    Comment: Performed at [facility], [address] [place], [place] [address]  Basic metabolic panel     Status: Abnormal   Collection Time: 03/29/23  6:45 AM  Result Value Ref Range   Sodium 141 135 - 145 mmol/L   Potassium 4.3 3.5 - 5.1 mmol/L   Chloride 100 98 - 111 mmol/L   CO2 30 22 - 32 mmol/L   Glucose, Bld 125 (H) 70 - 99 mg/dL    Comment: Glucose reference range applies only  to samples taken after fasting for at least 8 hours.   BUN 13 8 - 23 mg/dL   Creatinine, Ser 8.65 (L) 0.44 - 1.00 mg/dL   Calcium 9.5 8.9 - 78.4 mg/dL   GFR, Estimated >69 >62 mL/min    Comment: (NOTE) Calculated using the CKD-EPI Creatinine Equation (2021)    Anion gap 11 5 - 15    Comment: Performed at [facility], [address] [place], [place] [address]  Magnesium     Status: Abnormal   Collection Time: 03/29/23  6:45 AM  Result Value Ref Range   Magnesium 2.5 (H) 1.7 - 2.4 mg/dL    Comment: Performed at [facility], [address], [place], [place] [address]  Phosphorus     Status: None   Collection Time: 03/29/23  6:45 AM  Result  Value Ref Range   Phosphorus 4.3 2.5 - 4.6 mg/dL    Comment: Performed at [facility], [address] [place], [place] [address]  CBC     Status: Abnormal   Collection Time: 03/30/23  4:04 AM  Result Value Ref Range   WBC 6.5 4.0 - 10.5 K/uL   RBC 3.19 (L) 3.87 - 5.11 MIL/uL   Hemoglobin 8.6 (L) 12.0 - 15.0 g/dL   HCT 60.4 (L) 54.0 - 98.1 %   MCV 88.4 80.0 - 100.0 fL   MCH 27.0 26.0 - 34.0 pg   MCHC 30.5 30.0 - 36.0 g/dL   RDW 19.1 (H) 47.8 - 29.5 %   Platelets 268 150 - 400 K/uL   nRBC 0.0 0.0 - 0.2 %    Comment: Performed at [facility], [address], [place], [place] [address]  Basic metabolic panel     Status: Abnormal   Collection Time: 03/30/23  4:04 AM  Result Value Ref Range   Sodium 140 135 - 145 mmol/L   Potassium 4.3 3.5 - 5.1 mmol/L   Chloride 102 98 - 111 mmol/L   CO2 29 22 - 32 mmol/L   Glucose, Bld 113 (H) 70 - 99 mg/dL    Comment: Glucose reference range applies only to samples taken after fasting for at least 8 hours.   BUN 17 8 - 23 mg/dL   Creatinine, Ser 8.65 (L) 0.44 - 1.00 mg/dL   Calcium 8.6 (L) 8.9 - 10.3 mg/dL   GFR, Estimated >78 >46 mL/min    Comment: (NOTE) Calculated using the CKD-EPI Creatinine Equation (2021)    Anion gap 9 5 - 15    Comment: Performed at  [facility], [address] [place], [place] [address]  Magnesium     Status: None   Collection Time: 03/30/23  4:04 AM  Result Value Ref Range   Magnesium 2.4 1.7 - 2.4 mg/dL    Comment: Performed at [facility], [address] [place], [place] [address]  Phosphorus     Status: None   Collection Time: 03/30/23  4:04 AM  Result Value Ref Range   Phosphorus 4.2 2.5 - 4.6 mg/dL    Comment: Performed at [facility], [address] [place], [place] [address]  CBC     Status: Abnormal   Collection Time: 03/31/23  4:10 AM  Result Value Ref Range   WBC 5.0 4.0 - 10.5 K/uL   RBC 3.22 (L) 3.87 - 5.11 MIL/uL   Hemoglobin 8.7 (L) 12.0 - 15.0 g/dL   HCT 02.7 (L) 25.3 - 66.4 %   MCV 89.4 80.0 - 100.0 fL   MCH 27.0 26.0 - 34.0 pg   MCHC 30.2 30.0 - 36.0 g/dL   RDW 40.3 (H) 47.4 - 25.9 %   Platelets 278 150 - 400 K/uL   nRBC 0.0 0.0 - 0.2 %    Comment: Performed at [facility], [address] [place], [place] [address]  Basic metabolic panel     Status: Abnormal   Collection Time: 03/31/23  4:10 AM  Result Value Ref Range   Sodium 138 135 - 145 mmol/L   Potassium 4.7 3.5 - 5.1 mmol/L   Chloride 99 98 - 111 mmol/L   CO2 29 22 - 32 mmol/L   Glucose, Bld 120 (H) 70 - 99 mg/dL    Comment: Glucose reference range applies only to samples taken after fasting for at least 8 hours.  BUN 17 8 - 23 mg/dL   Creatinine, Ser 1.61 0.44 - 1.00 mg/dL   Calcium 8.8 (L) 8.9 - 10.3 mg/dL   GFR, Estimated >09 >60 mL/min    Comment: (NOTE) Calculated using the CKD-EPI Creatinine Equation (2021)    Anion gap 10 5 - 15    Comment: Performed at [facility], [address] [place] [address] [place], [place] [address]  Magnesium     Status: Abnormal   Collection Time: 03/31/23  4:10 AM  Result Value Ref Range   Magnesium 2.5 (H) 1.7 - 2.4 mg/dL    Comment: Performed at [facility], [address] [place], [place] [address]  Phosphorus     Status: Abnormal    Collection Time: 03/31/23  4:10 AM  Result Value Ref Range   Phosphorus 5.0 (H) 2.5 - 4.6 mg/dL    Comment: Performed at [facility], [address] [place], [place] [address]  CBC     Status: Abnormal   Collection Time: 04/01/23  3:46 AM  Result Value Ref Range   WBC 4.7 4.0 - 10.5 K/uL   RBC 3.14 (L) 3.87 - 5.11 MIL/uL   Hemoglobin 8.3 (L) 12.0 - 15.0 g/dL   HCT 56.2 (L) 13.0 - 86.5 %   MCV 89.2 80.0 - 100.0 fL   MCH 26.4 26.0 - 34.0 pg   MCHC 29.6 (L) 30.0 - 36.0 g/dL   RDW 78.4 (H) 69.6 - 29.5 %   Platelets 250 150 - 400 K/uL   nRBC 0.0 0.0 - 0.2 %    Comment: Performed at [facility], [address] [place], [place] [address]  Basic metabolic panel     Status: Abnormal   Collection Time: 04/01/23  3:46 AM  Result Value Ref Range   Sodium 139 135 - 145 mmol/L   Potassium 4.2 3.5 - 5.1 mmol/L   Chloride 101 98 - 111 mmol/L   CO2 30 22 - 32 mmol/L   Glucose, Bld 131 (H) 70 - 99 mg/dL    Comment: Glucose reference range applies only to samples taken after fasting for at least 8 hours.   BUN 22 8 - 23 mg/dL   Creatinine, Ser 2.44 0.44 - 1.00 mg/dL   Calcium 8.4 (L) 8.9 - 10.3 mg/dL   GFR, Estimated >01 >02 mL/min    Comment: (NOTE) Calculated using the CKD-EPI Creatinine Equation (2021)    Anion gap 8 5 - 15    Comment: Performed at [facility], [address] [place], [place] [address]  CBC     Status: Abnormal   Collection Time: 04/02/23  4:00 AM  Result Value Ref Range   WBC 6.1 4.0 - 10.5 K/uL   RBC 3.19 (L) 3.87 - 5.11 MIL/uL   Hemoglobin 8.6 (L) 12.0 - 15.0 g/dL   HCT 64.4 (L) 03.4 - 74.2 %   MCV 88.4 80.0 - 100.0 fL   MCH 27.0 26.0 - 34.0 pg   MCHC 30.5 30.0 - 36.0 g/dL   RDW 59.5 (H) 63.8 - 75.6 %   Platelets 276 150 - 400 K/uL   nRBC 0.0 0.0 - 0.2 %    Comment: Performed at [facility], [address], [place], [place] [address]  Basic metabolic panel     Status: Abnormal   Collection Time: 04/02/23  4:00 AM   Result Value Ref Range   Sodium 139 135 - 145 mmol/L   Potassium 4.3 3.5 - 5.1 mmol/L  Chloride 101 98 - 111 mmol/L   CO2 30 22 - 32 mmol/L   Glucose, Bld 106 (H) 70 - 99 mg/dL    Comment: Glucose reference range applies only to samples taken after fasting for at least 8 hours.   BUN 22 8 - 23 mg/dL   Creatinine, Ser 1.61 (L) 0.44 - 1.00 mg/dL   Calcium 8.6 (L) 8.9 - 10.3 mg/dL   GFR, Estimated >09 >60 mL/min    Comment: (NOTE) Calculated using the CKD-EPI Creatinine Equation (2021)    Anion gap 8 5 - 15    Comment: Performed at [facility], [address] [place], [place] [address]  CK     Status: Abnormal   Collection Time: 04/04/23  5:02 AM  Result Value Ref Range   Total CK 33 (L) 38 - 234 U/L    Comment: Performed at [facility], [address] [place], [place] [address]  CBC with Differential/Platelet     Status: Abnormal   Collection Time: 04/04/23  5:02 AM  Result Value Ref Range   WBC 6.6 4.0 - 10.5 K/uL   RBC 3.08 (L) 3.87 - 5.11 MIL/uL   Hemoglobin 8.1 (L) 12.0 - 15.0 g/dL   HCT 47.8 (L) 29.5 - 62.1 %   MCV 89.3 80.0 - 100.0 fL   MCH 26.3 26.0 - 34.0 pg   MCHC 29.5 (L) 30.0 - 36.0 g/dL   RDW 30.8 (H) 65.7 - 84.6 %   Platelets 275 150 - 400 K/uL   nRBC 0.0 0.0 - 0.2 %   Neutrophils Relative % 62 %   Neutro Abs 4.1 1.7 - 7.7 K/uL   Lymphocytes Relative 25 %   Lymphs Abs 1.7 0.7 - 4.0 K/uL   Monocytes Relative 9 %   Monocytes Absolute 0.6 0.1 - 1.0 K/uL   Eosinophils Relative 4 %   Eosinophils Absolute 0.2 0.0 - 0.5 K/uL   Basophils Relative 0 %   Basophils Absolute 0.0 0.0 - 0.1 K/uL   Immature Granulocytes 0 %   Abs Immature Granulocytes 0.02 0.00 - 0.07 K/uL    Comment: Performed at [facility], [address] [place], [place] [address]  Basic metabolic panel     Status: Abnormal   Collection Time: 04/04/23  5:02 AM  Result Value Ref Range   Sodium 140 135 - 145 mmol/L   Potassium 4.5 3.5 - 5.1 mmol/L   Chloride 104  98 - 111 mmol/L   CO2 27 22 - 32 mmol/L   Glucose, Bld 89 70 - 99 mg/dL    Comment: Glucose reference range applies only to samples taken after fasting for at least 8 hours.   BUN 22 8 - 23 mg/dL   Creatinine, Ser 2.84 0.44 - 1.00 mg/dL   Calcium 7.5 (L) 8.9 - 10.3 mg/dL   GFR, Estimated >13 >24 mL/min    Comment: (NOTE) Calculated using the CKD-EPI Creatinine Equation (2021)    Anion gap 9 5 - 15    Comment: Performed at [facility], [address] [place], [place] [address]  Sedimentation rate     Status: Abnormal   Collection Time: 04/06/23  6:06 AM  Result Value Ref Range   Sed Rate 83 (H) 0 - 30 mm/hr    Comment: Performed at [facility], [address], [place], [place] [address]  C-reactive protein     Status: Abnormal   Collection Time: 04/06/23  6:06 AM  Result Value Ref  Range   CRP 1.6 (H) <1.0 mg/dL    Comment: Performed at [facility], [address] [place], [place] [address]  CBC with Differential/Platelet     Status: Abnormal   Collection Time: 04/07/23  9:27 AM  Result Value Ref Range   WBC 5.2 4.0 - 10.5 K/uL   RBC 3.47 (L) 3.87 - 5.11 MIL/uL   Hemoglobin 9.2 (L) 12.0 - 15.0 g/dL   HCT 65.7 (L) 84.6 - 96.2 %   MCV 89.9 80.0 - 100.0 fL   MCH 26.5 26.0 - 34.0 pg   MCHC 29.5 (L) 30.0 - 36.0 g/dL   RDW 95.2 (H) 84.1 - 32.4 %   Platelets 265 150 - 400 K/uL   nRBC 0.0 0.0 - 0.2 %   Neutrophils Relative % 62 %   Neutro Abs 3.2 1.7 - 7.7 K/uL   Lymphocytes Relative 26 %   Lymphs Abs 1.4 0.7 - 4.0 K/uL   Monocytes Relative 7 %   Monocytes Absolute 0.4 0.1 - 1.0 K/uL   Eosinophils Relative 4 %   Eosinophils Absolute 0.2 0.0 - 0.5 K/uL   Basophils Relative 0 %   Basophils Absolute 0.0 0.0 - 0.1 K/uL   Immature Granulocytes 1 %   Abs Immature Granulocytes 0.03 0.00 - 0.07 K/uL    Comment: Performed at [facility], [address] [place], [place] [address]  Basic metabolic panel     Status: Abnormal   Collection Time:  04/07/23  9:27 AM  Result Value Ref Range   Sodium 139 135 - 145 mmol/L   Potassium 3.9 3.5 - 5.1 mmol/L   Chloride 102 98 - 111 mmol/L   CO2 27 22 - 32 mmol/L   Glucose, Bld 116 (H) 70 - 99 mg/dL    Comment: Glucose reference range applies only to samples taken after fasting for at least 8 hours.   BUN 16 8 - 23 mg/dL   Creatinine, Ser 7.25 0.44 - 1.00 mg/dL   Calcium 8.6 (L) 8.9 - 10.3 mg/dL   GFR, Estimated >36 >64 mL/min    Comment: (NOTE) Calculated using the CKD-EPI Creatinine Equation (2021)    Anion gap 10 5 - 15    Comment: Performed at [facility], [address] [place], [place] [address]  CBC with Differential/Platelet     Status: Abnormal   Collection Time: 04/09/23  8:38 AM  Result Value Ref Range   WBC 7.0 4.0 - 10.5 K/uL   RBC 3.81 (L) 3.87 - 5.11 MIL/uL   Hemoglobin 10.1 (L) 12.0 - 15.0 g/dL   HCT 42.5 (L) 95.6 - 38.7 %   MCV 87.9 80.0 - 100.0 fL   MCH 26.5 26.0 - 34.0 pg   MCHC 30.1 30.0 - 36.0 g/dL   RDW 56.4 (H) 33.2 - 95.1 %   Platelets 308 150 - 400 K/uL   nRBC 0.0 0.0 - 0.2 %   Neutrophils Relative % 68 %   Neutro Abs 4.8 1.7 - 7.7 K/uL   Lymphocytes Relative 22 %   Lymphs Abs 1.6 0.7 - 4.0 K/uL   Monocytes Relative 6 %   Monocytes Absolute 0.4 0.1 - 1.0 K/uL   Eosinophils Relative 4 %   Eosinophils Absolute 0.3 0.0 - 0.5 K/uL   Basophils Relative 0 %   Basophils Absolute 0.0 0.0 - 0.1 K/uL   Immature Granulocytes 0 %   Abs Immature Granulocytes 0.03 0.00 - 0.07 K/uL    Comment: Performed  at [facility], [address] [place], [place] [address]  Basic metabolic panel     Status: None   Collection Time: 04/09/23  8:38 AM  Result Value Ref Range   Sodium 139 135 - 145 mmol/L   Potassium 4.7 3.5 - 5.1 mmol/L   Chloride 102 98 - 111 mmol/L   CO2 27 22 - 32 mmol/L   Glucose, Bld 91 70 - 99 mg/dL    Comment: Glucose reference range applies only to samples taken after fasting for at least 8 hours.   BUN 20 8 - 23 mg/dL    Creatinine, Ser 6.04 0.44 - 1.00 mg/dL   Calcium 8.9 8.9 - 54.0 mg/dL   GFR, Estimated >98 >11 mL/min    Comment: (NOTE) Calculated using the CKD-EPI Creatinine Equation (2021)    Anion gap 10 5 - 15    Comment: Performed at [facility], [address] [place], [place] [address]  Protime-INR     Status: None   Collection Time: 04/09/23  8:38 AM  Result Value Ref Range   Prothrombin Time 15.0 11.4 - 15.2 seconds   INR 1.2 0.8 - 1.2    Comment: (NOTE) INR goal varies based on device and disease states. Performed at [facility], [address] [place], [place] [address]   CK     Status: Abnormal   Collection Time: 04/11/23  5:15 AM  Result Value Ref Range   Total CK 14 (L) 38 - 234 U/L    Comment: Performed at [facility], [address] [place], [place] [address]  CBC with Differential/Platelet     Status: Abnormal   Collection Time: 04/11/23  5:15 AM  Result Value Ref Range   WBC 5.2 4.0 - 10.5 K/uL   RBC 3.05 (L) 3.87 - 5.11 MIL/uL   Hemoglobin 8.2 (L) 12.0 - 15.0 g/dL   HCT 57.8 (L) 46.9 - 62.9 %   MCV 89.8 80.0 - 100.0 fL   MCH 26.9 26.0 - 34.0 pg   MCHC 29.9 (L) 30.0 - 36.0 g/dL   RDW 52.8 (H) 41.3 - 24.4 %   Platelets 307 150 - 400 K/uL   nRBC 0.0 0.0 - 0.2 %   Neutrophils Relative % 52 %   Neutro Abs 2.7 1.7 - 7.7 K/uL   Lymphocytes Relative 32 %   Lymphs Abs 1.6 0.7 - 4.0 K/uL   Monocytes Relative 8 %   Monocytes Absolute 0.4 0.1 - 1.0 K/uL   Eosinophils Relative 7 %   Eosinophils Absolute 0.4 0.0 - 0.5 K/uL   Basophils Relative 1 %   Basophils Absolute 0.0 0.0 - 0.1 K/uL   Immature Granulocytes 0 %   Abs Immature Granulocytes 0.02 0.00 - 0.07 K/uL    Comment: Performed at [facility], [address] [place], [place] [address]  Basic metabolic panel     Status: Abnormal   Collection Time: 04/11/23  5:15 AM  Result Value Ref Range   Sodium 137 135 - 145 mmol/L   Potassium 4.8 3.5 - 5.1 mmol/L   Chloride 103 98  - 111 mmol/L   CO2 26 22 - 32 mmol/L   Glucose, Bld 100 (H) 70 - 99 mg/dL    Comment: Glucose reference range applies only to samples taken after fasting for at least 8 hours.   BUN 20 8 - 23 mg/dL   Creatinine, Ser 2.53 0.44 - 1.00 mg/dL   Calcium 8.5 (L) 8.9 -  10.3 mg/dL   GFR, Estimated >29 >56 mL/min    Comment: (NOTE) Calculated using the CKD-EPI Creatinine Equation (2021)    Anion gap 8 5 - 15    Comment: Performed at [facility], [address] [place], [place] [address]  CBC     Status: Abnormal   Collection Time: 04/13/23  6:28 AM  Result Value Ref Range   WBC 5.7 4.0 - 10.5 K/uL   RBC 3.15 (L) 3.87 - 5.11 MIL/uL   Hemoglobin 8.4 (L) 12.0 - 15.0 g/dL   HCT 65.7 (L) 84.6 - 96.2 %   MCV 89.8 80.0 - 100.0 fL   MCH 26.7 26.0 - 34.0 pg   MCHC 29.7 (L) 30.0 - 36.0 g/dL   RDW 95.2 (H) 84.1 - 32.4 %   Platelets 296 150 - 400 K/uL   nRBC 0.0 0.0 - 0.2 %    Comment: Performed at [facility] [address] [place], [place] [address]  Basic metabolic panel     Status: Abnormal   Collection Time: 04/13/23  6:28 AM  Result Value Ref Range   Sodium 136 135 - 145 mmol/L   Potassium 4.5 3.5 - 5.1 mmol/L   Chloride 101 98 - 111 mmol/L   CO2 25 22 - 32 mmol/L   Glucose, Bld 93 70 - 99 mg/dL    Comment: Glucose reference range applies only to samples taken after fasting for at least 8 hours.   BUN 18 8 - 23 mg/dL   Creatinine, Ser 7.25 0.44 - 1.00 mg/dL   Calcium 8.7 (L) 8.9 - 10.3 mg/dL   GFR, Estimated >36 >64 mL/min    Comment: (NOTE) Calculated using the CKD-EPI Creatinine Equation (2021)    Anion gap 10 5 - 15    Comment: Performed at [facility], [address] [place], [place] [address]  CBC     Status: Abnormal   Collection Time: 04/16/23  3:34 AM  Result Value Ref Range   WBC 5.5 4.0 - 10.5 K/uL   RBC 3.24 (L) 3.87 - 5.11 MIL/uL   Hemoglobin 8.7 (L) 12.0 - 15.0 g/dL   HCT 42.5 (L) 95.6 - 38.7 %   MCV 90.1 80.0 - 100.0 fL   MCH  26.9 26.0 - 34.0 pg   MCHC 29.8 (L) 30.0 - 36.0 g/dL   RDW 56.4 (H) 33.2 - 95.1 %   Platelets 283 150 - 400 K/uL   nRBC 0.0 0.0 - 0.2 %    Comment: Performed at [facility], [address] [place], [place] [address]  Basic metabolic panel     Status: Abnormal   Collection Time: 04/16/23  3:34 AM  Result Value Ref Range   Sodium 138 135 - 145 mmol/L   Potassium 4.2 3.5 - 5.1 mmol/L   Chloride 105 98 - 111 mmol/L   CO2 27 22 - 32 mmol/L   Glucose, Bld 94 70 - 99 mg/dL    Comment: Glucose reference range applies only to samples taken after fasting for at least 8 hours.   BUN 12 8 - 23 mg/dL   Creatinine, Ser 6.06 0.44 - 1.00 mg/dL   Calcium 8.6 (L) 8.9 - 10.3 mg/dL   GFR, Estimated >30 >16 mL/min    Comment: (NOTE) Calculated using the CKD-EPI Creatinine Equation (2021)    Anion gap 6 5 - 15    Comment: Performed at [facility], [address], [place], [place] [address]  Sedimentation rate  Status: Abnormal   Collection Time: 04/17/23  5:27 AM  Result Value Ref Range   Sed Rate 70 (H) 0 - 30 mm/hr    Comment: Performed at [facility], [address] [place], [place] [address]  C-reactive protein     Status: None   Collection Time: 04/17/23  5:27 AM  Result Value Ref Range   CRP 0.9 <1.0 mg/dL    Comment: Performed at [facility], [address] [place], [place] [address]  Hepatic function panel     Status: Abnormal   Collection Time: 04/17/23  5:27 AM  Result Value Ref Range   Total Protein 6.3 (L) 6.5 - 8.1 g/dL   Albumin 2.6 (L) 3.5 - 5.0 g/dL   AST 10 (L) 15 - 41 U/L   ALT 10 0 - 44 U/L   Alkaline Phosphatase 93 38 - 126 U/L   Total Bilirubin 0.2 (L) 0.3 - 1.2 mg/dL   Bilirubin, Direct <8.2 0.0 - 0.2 mg/dL   Indirect Bilirubin NOT CALCULATED 0.3 - 0.9 mg/dL    Comment: Performed at [facility], [address] [place], [place] [address]  Aerobic culture     Status: Abnormal   Collection Time: 05/25/23  2:30 AM   Result Value Ref Range   Aerobic Bacterial Culture Final report (A)    Organism ID, Bacteria Comment (A)     Comment: Escherichia coli, identified by an automated biochemical system. Multi-Drug Resistant Organism Susceptibility profile is consistent with a probable ESBL. Heavy growth    Organism ID, Bacteria Proteus mirabilis (A)     Comment: Scant growth   Organism ID, Bacteria Comment (A)     Comment: Methicillin - resistant Staphylococcus aureus Based on resistance to oxacillin this isolate would be resistant to all currently available beta-lactam antimicrobial agents, with the exception of the newer cephalosporins with anti-MRSA activity, such as Ceftaroline Moderate growth    Organism ID, Bacteria Routine flora     Comment: Moderate growth   Antimicrobial Susceptibility Comment     Comment:       ** S = Susceptible; I = Intermediate; R = Resistant **                    P = Positive; N = Negative             MICS are expressed in micrograms per mL    Antibiotic                 RSLT#1    RSLT#2    RSLT#3    RSLT#4 Amoxicillin/Clavulanic Acid    S Ampicillin                     R         S Cefazolin                      R Cefepime                       R         S Ceftriaxone                    R         S Cefuroxime                     R  S Ciprofloxacin                  R         S         R Clindamycin                                        R Ertapenem                      S         S Erythromycin                                       R Gentamicin                     S         S         S Imipenem                       S Levofloxacin                   R         S         R Linezolid                                          S Meropenem                      S         S Oxacillin                                          R Penicillin                                          R Piperacillin/Tazobactam        S         S Rifampin                                            S Tetracycline                   S         R         R Tobramycin                     S         S Trimethoprim/Sulfa             R         S         S Vancomycin  S     Radiology No results found.  Assessment/Plan  Amputation stump infection (HCC) Infection has been cleared and stop on the left leg has now healed.  Sutures were removed today.  Follow-up in 3 to 4 weeks.  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) Stable.  Chronic pain syndrome She has ongoing chronic pain and at some point may need to be managed predominantly by the pain clinic.  Essential hypertension blood pressure control important in reducing the progression of atherosclerotic disease. On appropriate oral medications.   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  Her blood vessels were not unreconstructable and she is now status post bilateral above-knee amputation.    Festus Barren, MD  06/12/2023 12:17 PM    This note was created with Dragon medical transcription system.  Any errors from dictation are purely unintentional

## 2023-06-12 NOTE — Assessment & Plan Note (Signed)
blood pressure control important in reducing the progression of atherosclerotic disease. On appropriate oral medications.  

## 2023-06-20 IMAGING — CT CT ANGIO CHEST
2 of 6 series · 13 of 36 positions shown · IV contrast (agent unspecified)
Comparison: 02/03/2019

CLINICAL DATA: 61-year-old female with chest pain after a fall 1
week ago on the right

EXAM:
CT ANGIOGRAPHY CHEST WITH CONTRAST
TECHNIQUE: Multidetector CT imaging of the chest was performed using the
standard protocol during bolus administration of intravenous
contrast. Multiplanar CT image reconstructions and MIPs were
obtained to evaluate the vascular anatomy.

[Series 4: axial arterial cta thorax 2.00 · axial · arterial · 0.65mm/px · z∈[-1185,-905]mm · 12 of 166 slices shown]
[im 13/166  lung]
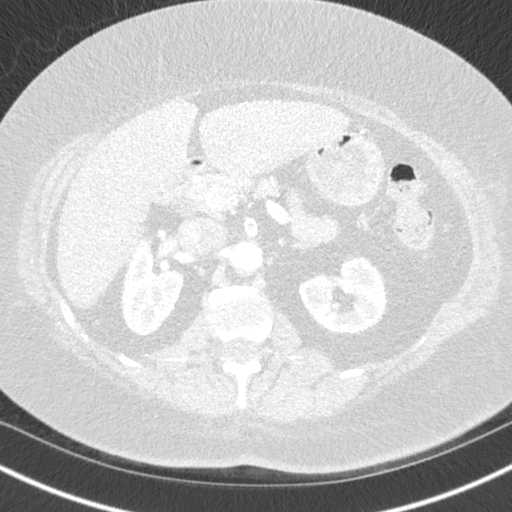
[im 26/166  mediastinal]
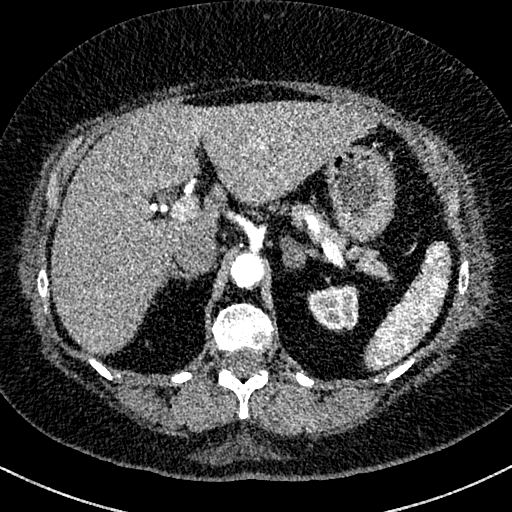
[im 39/166  lung]
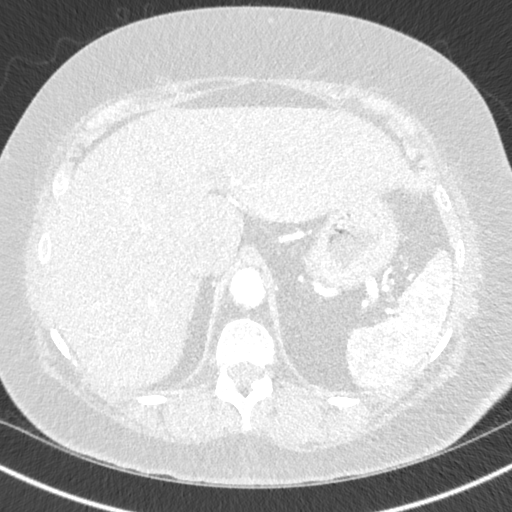
[im 51/166  mediastinal]
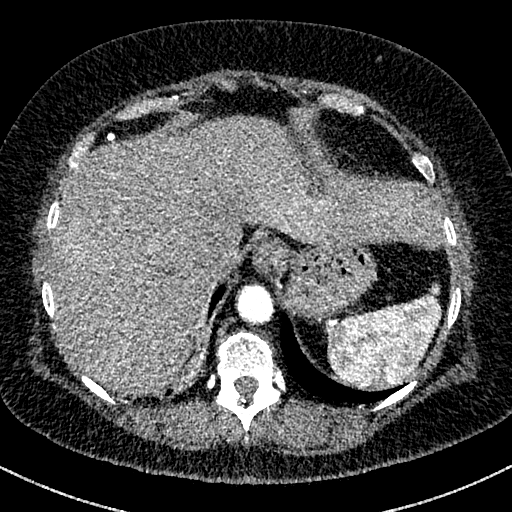
[im 64/166  lung]
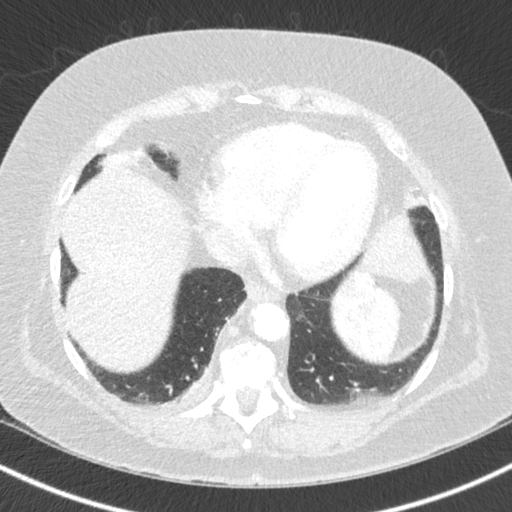
[im 77/166  mediastinal]
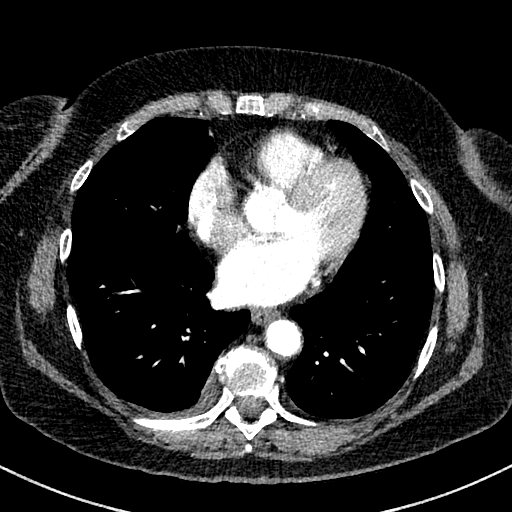
[im 89/166  lung]
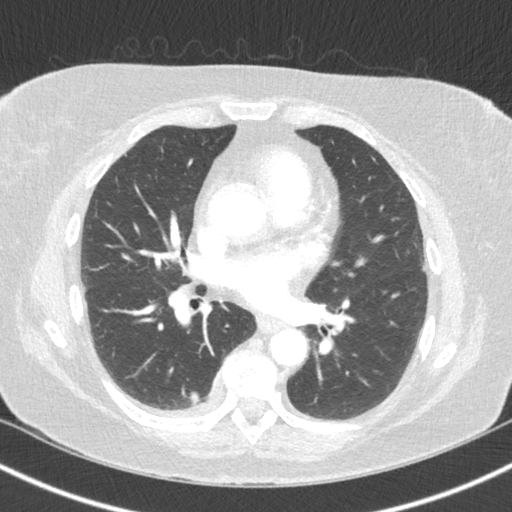
[im 102/166  mediastinal]
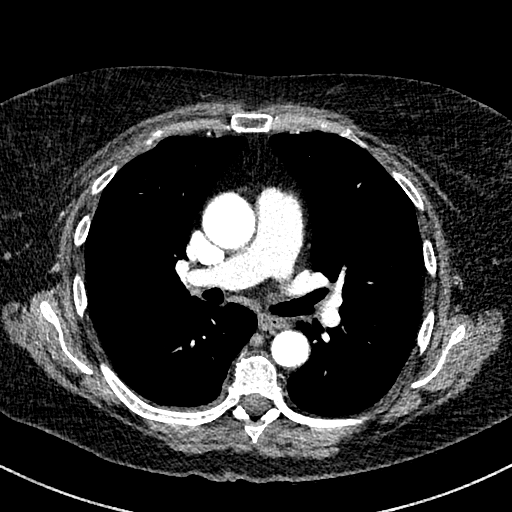
[im 115/166  lung]
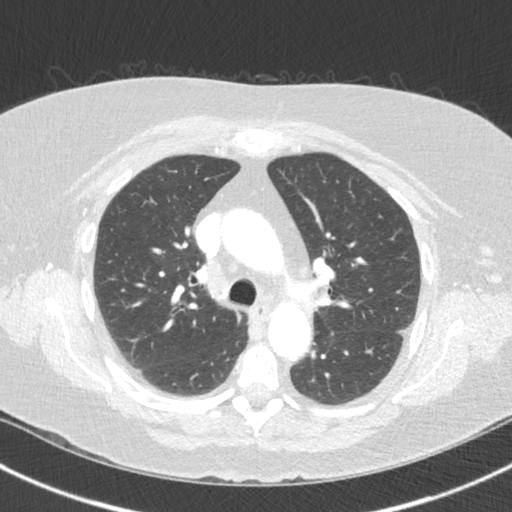
[im 127/166  mediastinal]
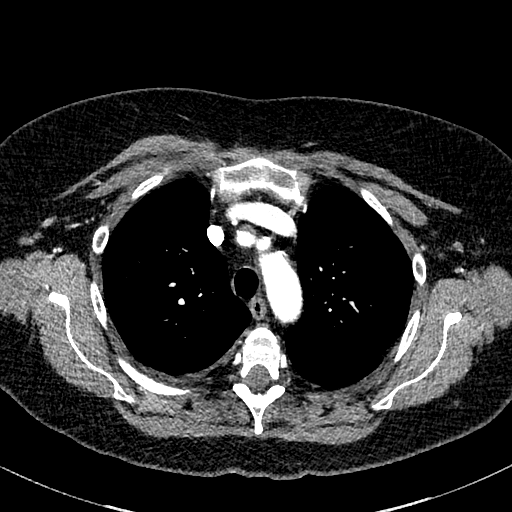
[im 140/166  lung]
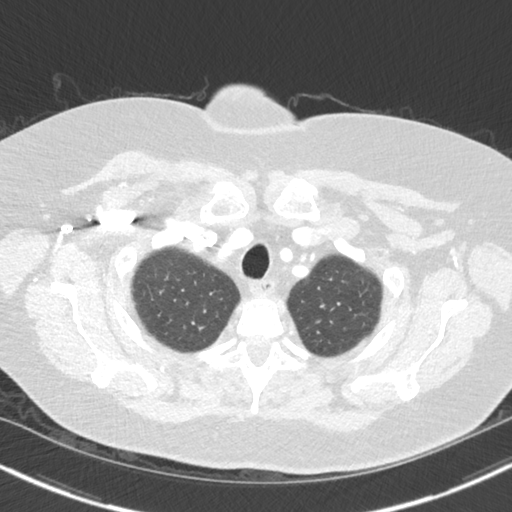
[im 153/166  mediastinal]
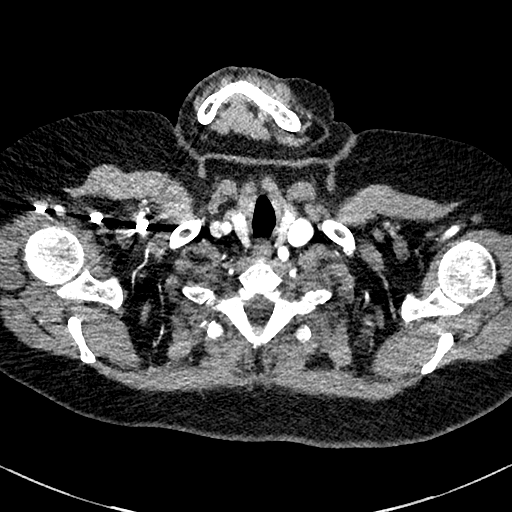

[Series 7: cor st cta thorax 2.00 cor · coronal · 0.65mm/px · 1 of 148 slices shown]
[im 74/148  mediastinal]
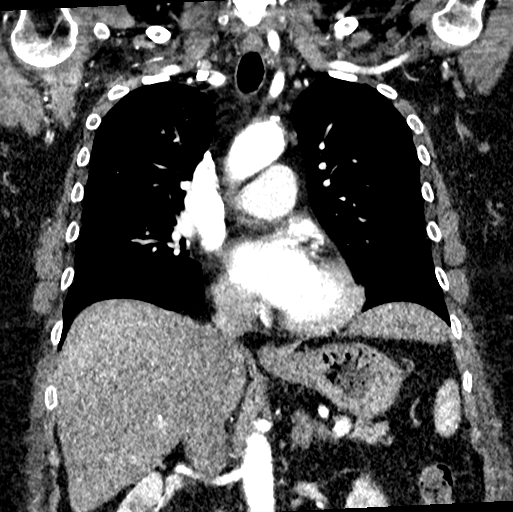

[13 of 36 positions shown; findings below may reference images not displayed]

RADIATION DOSE REDUCTION: This exam was performed according to the
departmental dose-optimization program which includes automated
exposure control, adjustment of the mA and/or kV according to
patient size and/or use of iterative reconstruction technique.

CONTRAST:  75mL OMNIPAQUE IOHEXOL 350 MG/ML SOLN
FINDINGS: Cardiovascular:

Heart:

Heart size borderline enlarged. No pericardial fluid/thickening.
Calcifications of the left anterior descending and right coronary
arteries.

Aorta:

Unremarkable course, caliber, contour of the thoracic aorta. No
aneurysm or dissection flap. No periaortic fluid. Mild
atherosclerotic changes of the aortic arch. Branch vessels are
patent.

Pulmonary arteries:

No central, lobar, segmental, or proximal subsegmental filling
defects.

Mediastinum/Nodes: No mediastinal adenopathy. Unremarkable
appearance of the thoracic esophagus.

Unremarkable appearance of the thoracic inlet and thyroid.

Lungs/Pleura: Trace right-sided pleural fluid at the dependent
aspects of the chest. Hounsfield units are intermediate density,
measuring 50. No pneumothorax. No confluent airspace disease.

Minimal atelectasis.

Redemonstration posterior right lung nodule which previously
measured 7 mm, currently approximately 6 mm in solid component with
increasing cystic/cavitary change on the margin. (Image 79 of series
5).

Upper Abdomen: No acute finding of the upper abdomen. Diffusely
decreased attenuation of liver parenchyma, compatible with
steatosis. Redemonstration of occluded SMA stent.

Musculoskeletal: No acute displaced fracture, specifically, no
displaced rib fracture. Mild degenerative changes of the spine.

Review of the MIP images confirms the above findings.
IMPRESSION: Trace right-sided pleural fluid, with Hounsfield units suggesting
some complexity, potentially blood/hemothorax. However, there is no
other evidence of associated trauma such as fracture, pulmonary
contusion, pulmonary hemorrhage, etc.

Redemonstration of posterior right pulmonary nodule which has not
increased in size from the comparison chest CT, however, has
developed some marginal cystic change/cavitation. While the most
likely entity would be post inflammatory/infectious changes, a
follow-up CT is indicated, in 6-12 months.

Aortic atherosclerosis and coronary artery disease. Aortic
Atherosclerosis ([id number].T).

Additional ancillary findings as above.

## 2023-07-02 ENCOUNTER — Other Ambulatory Visit (INDEPENDENT_AMBULATORY_CARE_PROVIDER_SITE_OTHER): Payer: Self-pay | Admitting: Nurse Practitioner

## 2023-07-03 ENCOUNTER — Ambulatory Visit (INDEPENDENT_AMBULATORY_CARE_PROVIDER_SITE_OTHER): Payer: 59 | Admitting: Nurse Practitioner

## 2023-07-04 IMAGING — DX DG CHEST 1V PORT
1 series · 1 of 1 positions shown · non-contrast
Comparison: 02/02/2019

CLINICAL DATA: CHF/COPD.  Chest pain

EXAM:
PORTABLE CHEST 1 VIEW

[chest ap]
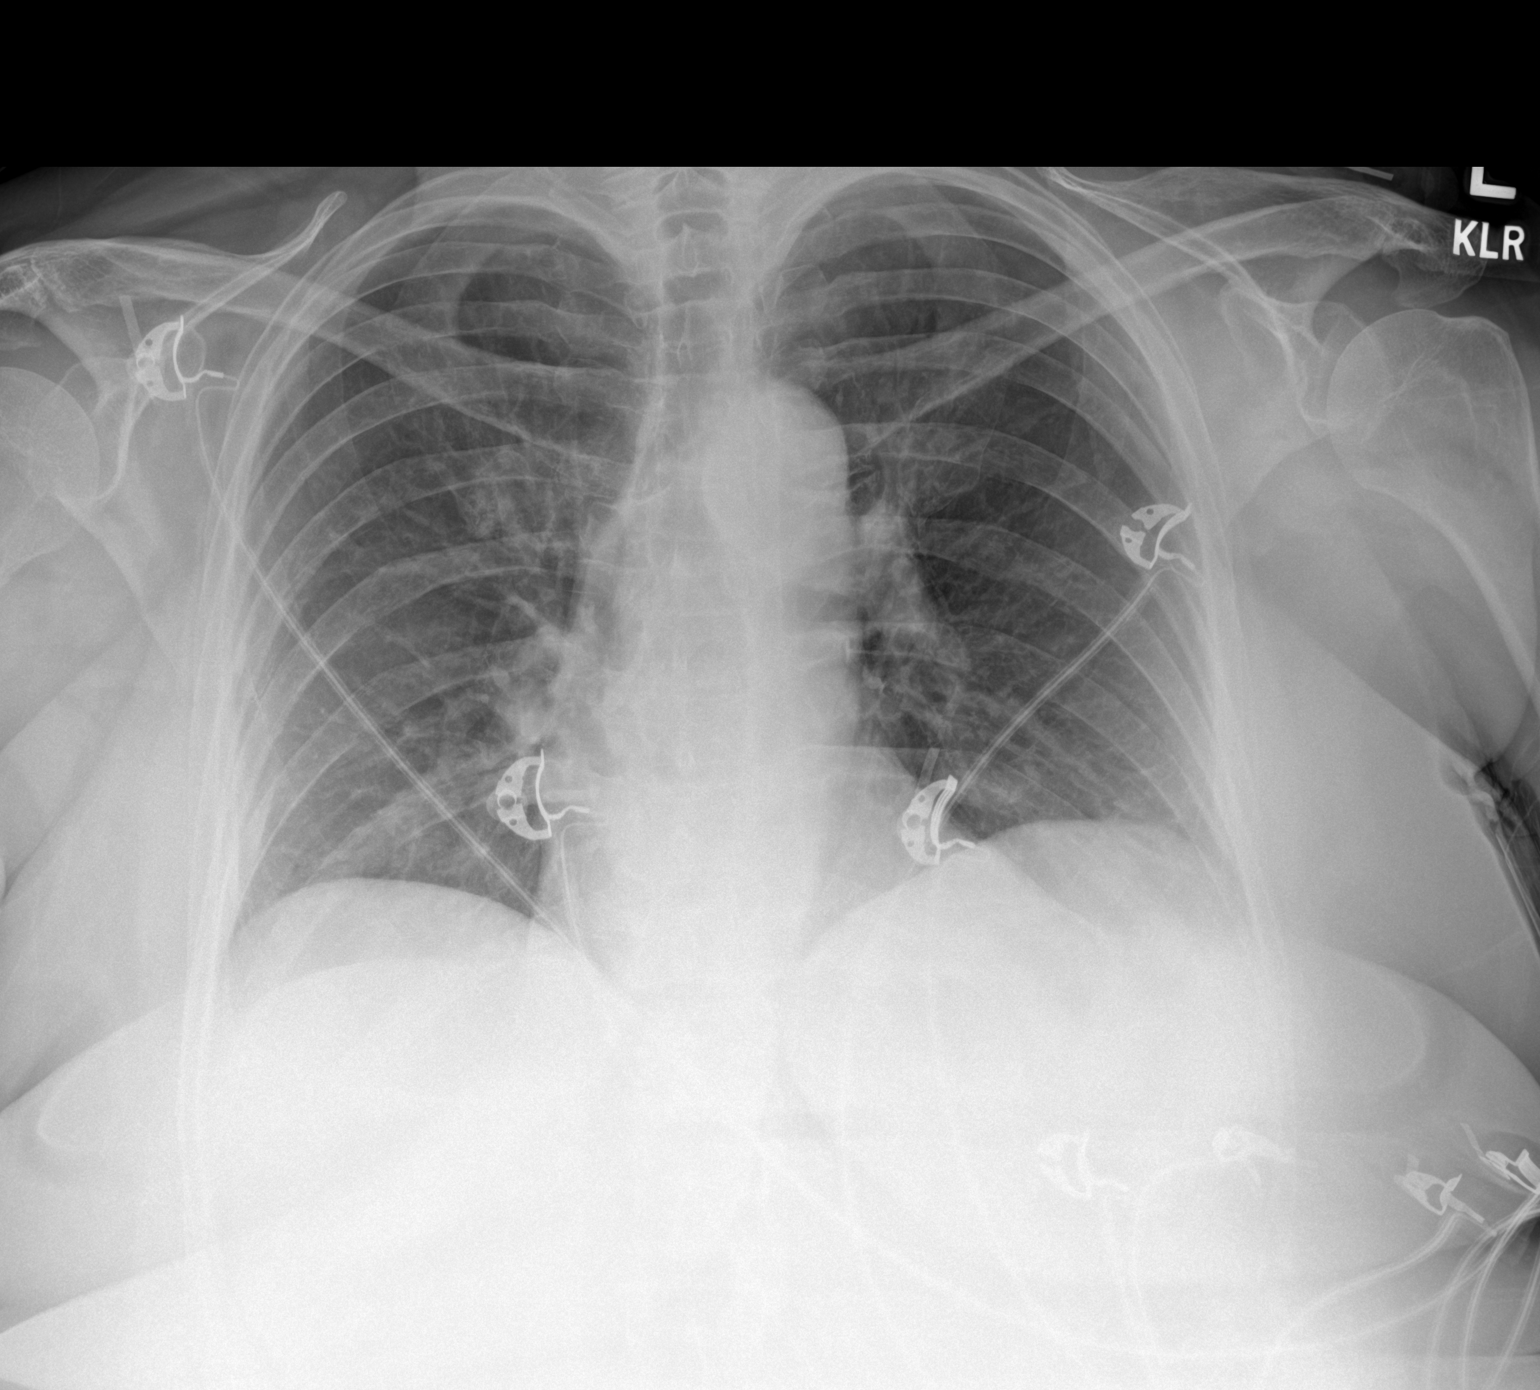

[1 of 1 positions shown; findings below may reference images not displayed]

FINDINGS: The heart size and mediastinal contours are within normal limits.
Both lungs are clear. The visualized skeletal structures are
unremarkable.
IMPRESSION: No active disease.

## 2023-07-10 ENCOUNTER — Ambulatory Visit (INDEPENDENT_AMBULATORY_CARE_PROVIDER_SITE_OTHER): Payer: 59 | Admitting: Nurse Practitioner

## 2023-07-12 ENCOUNTER — Ambulatory Visit (INDEPENDENT_AMBULATORY_CARE_PROVIDER_SITE_OTHER): Payer: 59 | Admitting: Nurse Practitioner

## 2023-07-12 ENCOUNTER — Other Ambulatory Visit (INDEPENDENT_AMBULATORY_CARE_PROVIDER_SITE_OTHER): Payer: Self-pay | Admitting: Nurse Practitioner

## 2023-07-12 VITALS — BP 102/77 | HR 64 | Resp 18

## 2023-07-12 DIAGNOSIS — G894 Chronic pain syndrome: Secondary | ICD-10-CM

## 2023-07-12 DIAGNOSIS — I10 Essential (primary) hypertension: Secondary | ICD-10-CM

## 2023-07-12 DIAGNOSIS — T8740 Infection of amputation stump, unspecified extremity: Secondary | ICD-10-CM

## 2023-07-12 MED ORDER — DOXYCYCLINE HYCLATE 100 MG PO CAPS: 100.0000 mg | ORAL_CAPSULE | Freq: Two times a day (BID) | ORAL | 0 refills | Status: AC

## 2023-07-15 ENCOUNTER — Encounter (INDEPENDENT_AMBULATORY_CARE_PROVIDER_SITE_OTHER): Payer: Self-pay | Admitting: Nurse Practitioner

## 2023-07-15 NOTE — Progress Notes (Signed)
Subjective:    Patient ID: Carla [name], female    DOB: [birth year], 62 y.o.   MRN: [id number] Chief Complaint  Patient presents with   Venous Insufficiency    The patient presents today for wound evaluation of her left above-knee amputation.  She notes that it is very painful and tender even to very slight touch.  She thinks that the wound is reopened but there are no signs of dehiscence but there is an area that does appear a little bit irritated.  She is unfortunately not very mobile at home.  Her right above-knee amputation has a site that is healed.    Review of Systems  Cardiovascular:  Positive for leg swelling.  Skin:  Positive for wound.  All other systems reviewed and are negative.      Objective:   Physical Exam Vitals reviewed.  HENT:     Head: Normocephalic.  Cardiovascular:     Rate and Rhythm: Normal rate.  Pulmonary:     Effort: Pulmonary effort is normal.  Musculoskeletal:     Right Lower Extremity: Right leg is amputated above knee.     Left Lower Extremity: Left leg is amputated above knee.  Skin:    General: Skin is warm and dry.  Neurological:     Mental Status: She is alert and oriented to person, place, and time.  Psychiatric:        Mood and Affect: Mood normal.        Behavior: Behavior normal.        Thought Content: Thought content normal.        Judgment: Judgment normal.     BP 102/77 (BP Location: Left Arm)   Pulse 64   Resp 18   Past Medical History:  Diagnosis Date   Abdominal aortic atherosclerosis (HCC)    a. 05/2017 CTA abd/pelvis: significant atherosclerotic dzs of the infrarenal abd Ao w/ some mural thrombus. No aneurysm or dissection.   Acute focal ischemia of small intestine (HCC)    Acute right-sided low back pain with right-sided sciatica 06/13/2017   AKI (acute kidney injury) (HCC) 07/29/2017   Anemia    Anginal pain (HCC)    a. 08/2012 Lexiscan MV: EF 54%, non ischemia/infarct.   Anxiety and depression    Baker's  cyst of knee, right    a. 07/2016 U/S: 4.1 x 1.4 x 2.9 cystic structure in R poplitetal fossa.   Bell's palsy    CAD (coronary artery disease)    CHF (congestive heart failure) (HCC)    a.) TTE 06/20/2017: EF 60-65%, no rwma, G1DD, no source of cardiac emboli.   Chronic anticoagulation    a.) apixaban   Chronic idiopathic constipation    Chronic mesenteric ischemia (HCC)    Chronic pain    COPD (chronic obstructive pulmonary disease) (HCC)    Dyspnea    Embolus of superior mesenteric artery (HCC)    a. 05/2017 CTA Abd/pelvis: apparent thrombus or embolus in prox SMA (70-90%); b. 05/2017 catheter directed tPA, mechanical thrombectomy, and stenting of the SMA.   Essential hypertension with goal blood pressure less than 140/90 01/19/2016   Gastroesophageal reflux disease 01/19/2016   GERD (gastroesophageal reflux disease)    H/O colectomy    History of 2019 novel coronavirus disease (COVID-19) 11/14/2019   History of kidney stones    Hyperlipidemia    Hypertension    Morbid obesity with BMI of 40.0-44.9, adult (HCC)    NSTEMI (non-ST elevated myocardial infarction) (HCC)  06/27/2021   a.) high sensitivity troponins trended: 893 --> 1587 --> 1748 --> 868 --> 735 ng/L   Occlusive mesenteric ischemia (HCC) 06/19/2017   Osteoarthritis    PAD (peripheral artery disease) (HCC)    PAF (paroxysmal atrial fibrillation) (HCC)    a.) CHA2DS2-VASc = 6 (sex, CHF, HTN, DVT x2, aortic plaque). b.) rate/rhythm maintained on oral metoprolol tartrate; chronically anticoagulated with standard dose apixaban.   Palpitations    Peripheral edema    Personal history of other malignant neoplasm of skin 06/01/2016   Pneumonia    Pre-diabetes    Primary osteoarthritis of both knees 06/13/2017   SBO (small bowel obstruction) (HCC) 08/06/2017   Skin cancer of nose    Superior mesenteric artery thrombosis (HCC)    Vitamin D deficiency     Social History   Socioeconomic History   Marital status: Single     Spouse name: Not on file   Number of children: Not on file   Years of education: Not on file   Highest education level: Not on file  Occupational History   Occupation: Unemployed  Tobacco Use   Smoking status: Former    Current packs/day: 0.00    Types: Cigarettes    Quit date: 2023    Years since quitting: 1.7   Smokeless tobacco: Never  Vaping Use   Vaping status: Never Used  Substance and Sexual Activity   Alcohol use: No   Drug use: No   Sexual activity: Not Currently  Other Topics Concern   Not on file  Social History Narrative   Lives in [place] with daughter   Social Determinants of Health   Financial Resource Strain: Not on file  Food Insecurity: No Food Insecurity (03/22/2023)   Hunger Vital Sign    Worried About Running Out of Food in the Last Year: Never true    Ran Out of Food in the Last Year: Never true  Transportation Needs: No Transportation Needs (03/22/2023)   PRAPARE - Administrator, Civil Service (Medical): No    Lack of Transportation (Non-Medical): No  Physical Activity: Not on file  Stress: Not on file  Social Connections: Not on file  Intimate Partner Violence: Not At Risk (03/22/2023)   Humiliation, Afraid, Rape, and Kick questionnaire    Fear of Current or Ex-Partner: No    Emotionally Abused: No    Physically Abused: No    Sexually Abused: No    Past Surgical History:  Procedure Laterality Date   AMPUTATION Right 04/06/2022   Procedure: AMPUTATION ABOVE KNEE;  Surgeon: Annice Needy, MD;  Location: [facility];  Service: Vascular;  Laterality: Right;   AMPUTATION  12/07/2022   Procedure: AMPUTATION ABOVE KNEE REVISION WITH RESECTION OF FEMUR;  Surgeon: Annice Needy, MD;  Location: [facility];  Service: Vascular;;   AMPUTATION Left 02/21/2023   Procedure: AMPUTATION ABOVE KNEE;  Surgeon: Annice Needy, MD;  Location: [facility];  Service: General;  Laterality: Left;   AMPUTATION Left 03/22/2023   Procedure: AMPUTATION ABOVE KNEE STUMP  DEBIDEMENT;  Surgeon: Annice Needy, MD;  Location: [facility];  Service: Vascular;  Laterality: Left;   AMPUTATION TOE Left 12/09/2019   Procedure: AMPUTATION TOE MPJ left;  Surgeon: Rosetta Posner, DPM;  Location: [facility];  Service: Podiatry;  Laterality: Left;   APPENDECTOMY     APPLICATION OF WOUND VAC  12/01/2021   Procedure: APPLICATION OF WOUND VAC;  Surgeon: Annice Needy, MD;  Location: [facility];  Service: Vascular;;   APPLICATION OF WOUND VAC Right 05/03/2022   Procedure: APPLICATION OF WOUND VAC;  Surgeon: Annice Needy, MD;  Location: [facility];  Service: Vascular;  Laterality: Right;   APPLICATION OF WOUND VAC Left 07/23/2022   Procedure: APPLICATION OF WOUND VAC;  Surgeon: Nada Libman, MD;  Location: [facility];  Service: Vascular;  Laterality: Left;  Prevena   APPLICATION OF WOUND VAC Right 08/28/2022   Procedure: APPLICATION OF WOUND VAC;  Surgeon: Annice Needy, MD;  Location: [facility];  Service: Vascular;  Laterality: Right;   APPLICATION OF WOUND VAC  12/07/2022   Procedure: APPLICATION OF WOUND VAC;  Surgeon: Annice Needy, MD;  Location: [facility];  Service: Vascular;;   APPLICATION OF WOUND VAC Left 03/28/2023   Procedure: LEFT STUMP WOUND VAC EXCHANGE;  Surgeon: Annice Needy, MD;  Location: [facility];  Service: Vascular;  Laterality: Left;   APPLICATION OF WOUND VAC Left 04/02/2023   Procedure: LEFT STUMP WOUND VAC EXCHANGE;  Surgeon: Annice Needy, MD;  Location: [facility];  Service: Vascular;  Laterality: Left;   APPLICATION OF WOUND VAC Left 04/05/2023   Procedure: LEFT STUMP WOUND VAC EXCHANGE;  Surgeon: Annice Needy, MD;  Location: [facility];  Service: Vascular;  Laterality: Left;   APPLICATION OF WOUND VAC Left 04/09/2023   Procedure: LEFT STUMP WOUND VAC EXCHANGE;  Surgeon: Annice Needy, MD;  Location: [facility];  Service: Vascular;  Laterality: Left;   APPLICATION OF WOUND VAC Left 04/16/2023   Procedure: LEFT STUMP WOUND VAC EXCHANGE;  Surgeon: Annice Needy, MD;  Location: [facility];   Service: Vascular;  Laterality: Left;   APPLICATION OF WOUND VAC Left 04/23/2023   Procedure: LEFT STUMP WOUND VAC EXCHANGE;  Surgeon: Renford Dills, MD;  Location: [facility];  Service: Vascular;  Laterality: Left;   APPLICATION OF WOUND VAC Left 04/30/2023   Procedure: LEFT STUMP WOUND VAC EXCHANGE;  Surgeon: Annice Needy, MD;  Location: [facility];  Service: Vascular;  Laterality: Left;   AXILLARY-FEMORAL BYPASS GRAFT Left 07/23/2022   Procedure: BYPASS GRAFT AXILLA-BIFEMORAL;  Surgeon: Nada Libman, MD;  Location: [facility];  Service: Vascular;  Laterality: Left;   CHOLECYSTECTOMY     COLON SURGERY     COLONOSCOPY WITH PROPOFOL N/A 03/23/2022   Procedure: COLONOSCOPY WITH PROPOFOL;  Surgeon: Wyline Mood, MD;  Location: [facility];  Service: Gastroenterology;  Laterality: N/A;  rectal bleed   CORONARY/GRAFT ACUTE MI REVASCULARIZATION N/A 02/15/2023   Procedure: Coronary/Graft Acute MI Revascularization;  Surgeon: Marykay Lex, MD;  Location: [facility];  Service: Cardiovascular;  Laterality: N/A;   EMBOLECTOMY OR THROMBECTOMY, WITH OR WITHOUT CATHETER; FEMOROPOPLITEAL, AORTOILIAC ARTERY, BY LEG INCISION N/A 06/21/2020   Left - Decomp Fasciotomy Leg; Ant &/Or Lat Compart Only; Midline - Angiography, Visceral, Selective Or Supraselective (With Or Without Flush Aortogram); Left - Revascularize, Endovasc, Open/Percut, Iliac Artery, Unilat, Initial Vessel; W/Translum Stent, W/Angioplasty; Right - Revascularize, Endovasc, Open/Percut, Iliac Artery, Ea Add`L Ipsilateral; W/Translumin Stent, W/Angioplasty; Location [facility];   ENDARTERECTOMY FEMORAL Left 12/01/2021   Procedure: ENDARTERECTOMY FEMORAL;  Surgeon: Annice Needy, MD;  Location: [facility];  Service: Vascular;  Laterality: Left;   ENDOVASCULAR REPAIR/STENT GRAFT Left 12/01/2021   Procedure: ENDOVASCULAR REPAIR/STENT GRAFT;  Surgeon: Annice Needy, MD;  Location: [facility];  Service: Cardiovascular;  Laterality: Left;   INCISION  AND DRAINAGE OF WOUND Right 05/03/2022   Procedure: IRRIGATION AND DEBRIDEMENT RIGHT  STUMP;  Surgeon: Annice Needy, MD;  Location: [facility];  Service: Vascular;  Laterality: Right;   IRRIGATION AND DEBRIDEMENT ABSCESS Right 08/28/2022   Procedure: AKA IRRIGATION AND DEBRIDEMENT ABSCESS;  Surgeon: Annice Needy, MD;  Location: [facility];  Service: Vascular;  Laterality: Right;   LAPAROTOMY N/A 08/08/2017   Procedure: EXPLORATORY LAPAROTOMY POSSIBLE BOWEL RESECTION;  Surgeon: Leafy Ro, MD;  Location: [facility];  Service: General;  Laterality: N/A;   LEFT HEART CATH AND CORONARY ANGIOGRAPHY N/A 05/17/2021   Procedure: LEFT HEART CATH AND CORONARY ANGIOGRAPHY;  Surgeon: Alwyn Pea, MD;  Location: [facility];  Service: Cardiovascular;  Laterality: N/A;   LEFT HEART CATH AND CORONARY ANGIOGRAPHY N/A 02/15/2023   Procedure: LEFT HEART CATH AND CORONARY ANGIOGRAPHY;  Surgeon: Marykay Lex, MD;  Location: [facility];  Service: Cardiovascular;  Laterality: N/A;   LOWER EXTREMITY ANGIOGRAPHY Left 11/17/2019   Procedure: LOWER EXTREMITY ANGIOGRAPHY;  Surgeon: Annice Needy, MD;  Location: [facility];  Service: Cardiovascular;  Laterality: Left;   LOWER EXTREMITY ANGIOGRAPHY Right 12/13/2020   Procedure: LOWER EXTREMITY ANGIOGRAPHY;  Surgeon: Annice Needy, MD;  Location: [facility];  Service: Cardiovascular;  Laterality: Right;   LOWER EXTREMITY ANGIOGRAPHY Right 01/10/2021   Procedure: LOWER EXTREMITY ANGIOGRAPHY;  Surgeon: Annice Needy, MD;  Location: [facility];  Service: Cardiovascular;  Laterality: Right;   LOWER EXTREMITY ANGIOGRAPHY Right 01/24/2021   Procedure: LOWER EXTREMITY ANGIOGRAPHY;  Surgeon: Annice Needy, MD;  Location: [facility];  Service: Cardiovascular;  Laterality: Right;   LOWER EXTREMITY ANGIOGRAPHY Bilateral 06/28/2021   Procedure: Lower Extremity Angiography;  Surgeon: Annice Needy, MD;  Location: [facility];  Service: Cardiovascular;  Laterality: Bilateral;   LOWER EXTREMITY ANGIOGRAPHY Right 11/28/2021   Procedure: LOWER EXTREMITY ANGIOGRAPHY;  Surgeon: Annice Needy, MD;  Location: [facility];  Service: Cardiovascular;  Laterality: Right;   LOWER EXTREMITY ANGIOGRAPHY Left 11/30/2021   Procedure: Lower Extremity Angiography;  Surgeon: Annice Needy, MD;  Location: [facility];  Service: Cardiovascular;  Laterality: Left;   LOWER EXTREMITY ANGIOGRAPHY Right 02/22/2022   Procedure: Lower Extremity Angiography;  Surgeon: Annice Needy, MD;  Location: [facility];  Service: Cardiovascular;  Laterality: Right;   LOWER EXTREMITY ANGIOGRAPHY Right 03/13/2022   Procedure: Lower Extremity Angiography;  Surgeon: Annice Needy, MD;  Location: [facility];  Service: Cardiovascular;  Laterality: Right;   LOWER EXTREMITY ANGIOGRAPHY Right 03/24/2022   Procedure: Lower Extremity Angiography;  Surgeon: Annice Needy, MD;  Location: [facility];  Service: Cardiovascular;  Laterality: Right;   LOWER EXTREMITY ANGIOGRAPHY Left 01/17/2023   Procedure: Lower Extremity Angiography;  Surgeon: Annice Needy, MD;  Location: [facility];  Service: Cardiovascular;  Laterality: Left;   LOWER EXTREMITY INTERVENTION N/A 11/19/2019   Procedure: LOWER EXTREMITY INTERVENTION;  Surgeon: Annice Needy, MD;  Location: [facility];  Service: Cardiovascular;  Laterality: N/A;   LOWER EXTREMITY INTERVENTION Bilateral 06/29/2021   Procedure: LOWER EXTREMITY INTERVENTION;  Surgeon: Annice Needy, MD;  Location: [facility];  Service: Cardiovascular;  Laterality: Bilateral;   LOWER EXTREMITY INTERVENTION Left 01/18/2023   Procedure: LOWER EXTREMITY INTERVENTION;  Surgeon: Annice Needy, MD;  Location: [facility];  Service: Cardiovascular;  Laterality: Left;   TEE WITHOUT CARDIOVERSION N/A 06/22/2017   Procedure: TRANSESOPHAGEAL ECHOCARDIOGRAM (TEE);  Surgeon: Iran Ouch, MD;  Location: [facility];  Service: Cardiovascular;  Laterality: N/A;   TOTAL KNEE ARTHROPLASTY Right 07/11/2018   Procedure: TOTAL KNEE ARTHROPLASTY;  Surgeon: Christena Flake, MD;  Location: [facility];  Service: Orthopedics;  Laterality: Right;   VAGINAL HYSTERECTOMY     VISCERAL ARTERY INTERVENTION N/A 06/20/2017   Procedure: Visceral Artery Intervention, possible aortic thrombectomy;  Surgeon: Annice Needy, MD;  Location: [facility];  Service: Cardiovascular;  Laterality: N/A;   VISCERAL ARTERY INTERVENTION N/A 01/28/2018   Procedure: VISCERAL ARTERY INTERVENTION;  Surgeon: Annice Needy, MD;  Location: [facility];  Service: Cardiovascular;  Laterality: N/A;    Family History  Problem Relation Age of Onset   Hypertension Mother    Heart disease Mother    Heart attack Mother    Breast cancer Mother    Heart disease Father    Hypertension Father    Breast cancer Paternal Aunt     Allergies  Allergen Reactions   Gabapentin     Tremors    Bactrim [Sulfamethoxazole-Trimethoprim] Hives, Itching and Other (See Comments)    Family member states she has burning sensation from the inside out       Latest Ref Rng & Units 04/16/2023    3:34 AM 04/13/2023    6:28 AM 04/11/2023    5:15 AM  CBC  WBC 4.0 - 10.5 K/uL 5.5  5.7  5.2   Hemoglobin 12.0 - 15.0 g/dL 8.7  8.4  8.2   Hematocrit 36.0 - 46.0 % 29.2  28.3  27.4   Platelets 150 - 400 K/uL 283  296  307       CMP     Component Value Date/Time   NA 138 04/16/2023 0334   NA 140 02/12/2015 1137   K 4.2 04/16/2023 0334   K 2.9 (L) 02/12/2015 1137   CL 105 04/16/2023 0334   CL 107 02/12/2015 1137   CO2 27 04/16/2023 0334   CO2 27 02/12/2015 1137   GLUCOSE 94 04/16/2023 0334   GLUCOSE 120 (H) 02/12/2015 1137   BUN 12 04/16/2023 0334   BUN 15 02/12/2015 1137   CREATININE 0.46 04/16/2023 0334   CREATININE 0.78 02/12/2015 1137   CALCIUM 8.6 (L) 04/16/2023 0334   CALCIUM 9.0 02/12/2015 1137   PROT 6.3 (L)  04/17/2023 0527   PROT 7.0 09/14/2020 1139   PROT 7.1 01/19/2015 1112   ALBUMIN 2.6 (L) 04/17/2023 0527   ALBUMIN 3.9 09/14/2020 1139   ALBUMIN 3.8 01/19/2015 1112   AST 10 (L) 04/17/2023 0527   AST 17 01/19/2015 1112   ALT 10 04/17/2023 0527   ALT 11 (L) 01/19/2015 1112   ALKPHOS 93 04/17/2023 0527   ALKPHOS 84 01/19/2015 1112   BILITOT 0.2 (L) 04/17/2023 0527   BILITOT <0.2 09/14/2020 1139   BILITOT 0.5 01/19/2015 1112   EGFR 90.0 01/02/2023 0745   GFRNONAA >60 04/16/2023 0334   GFRNONAA >60 02/12/2015 1137     No results found.     Assessment & Plan:   1. Amputation stump infection (HCC) The area of tenderness does not appear to be of open wound but there certainly may be an infection present.  We will culture the area and send in antibiotics to treat for the patient.  2. Chronic pain syndrome She still compelling evidence of significant tenderness in her phantom limb form despite all receiving significant pain medication from her primary care physician.  This morning  and she still continues to have provide for likely consider a pain center referral is still possible consideration for possible spinal stimulator.    3. Essential hypertension Continue antihypertensive medications as already ordered, these medications have been reviewed and there are no changes at this time.   Current Outpatient Medications on File Prior to Visit  Medication Sig Dispense Refill   apixaban (ELIQUIS) 5 MG TABS tablet Take 1 tablet (5 mg total) by mouth 2 (two) times daily. 60 tablet 11   atorvastatin (LIPITOR) 80 MG tablet Take 80 mg by mouth at bedtime.     COMBIVENT RESPIMAT 20-100 MCG/ACT AERS respimat Inhale 1 puff into the lungs every 6 (six) hours as needed.     esomeprazole (NEXIUM) 20 MG capsule Take 20 mg by mouth daily.     feeding supplement (ENSURE ENLIVE / ENSURE PLUS) LIQD Take 237 mLs by mouth 2 (two) times daily between meals. 237 mL 12   metoprolol tartrate (LOPRESSOR) 25 MG  tablet Take 0.5 tablets (12.5 mg total) by mouth 2 (two) times daily. 30 tablet 0   oxyCODONE (OXY IR/ROXICODONE) 5 MG immediate release tablet SMARTSIG:2 Tablet(s) By Mouth     polyethylene glycol (MIRALAX / GLYCOLAX) 17 g packet Take 17 g by mouth daily as needed for severe constipation. 30 each 0   pregabalin (LYRICA) 200 MG capsule Take 1 capsule (200 mg total) by mouth in the morning, at noon, and at bedtime. 90 capsule 3   ascorbic acid (VITAMIN C) 500 MG tablet Take 1 tablet (500 mg total) by mouth 2 (two) times daily. (Patient not taking: Reported on 07/12/2023) 30 tablet 0   diphenoxylate-atropine (LOMOTIL) 2.5-0.025 MG tablet Take 1 tablet by mouth 4 (four) times daily. (Patient not taking: Reported on 07/12/2023)     ferrous sulfate 325 (65 FE) MG tablet Take 325 mg by mouth daily. (Patient not taking: Reported on 04/30/2023)     folic acid (FOLVITE) 1 MG tablet Take 1 tablet (1 mg total) by mouth daily. (Patient not taking: Reported on 07/12/2023) 30 tablet 0   isosorbide mononitrate (IMDUR) 120 MG 24 hr tablet Take 1 tablet (120 mg total) by mouth daily. (Patient not taking: Reported on 07/12/2023) 30 tablet 1   midodrine (PROAMATINE) 2.5 MG tablet Take 1 tablet (2.5 mg total) by mouth 3 (three) times daily with meals. (Patient not taking: Reported on 07/12/2023) 90 tablet 0   nystatin cream (MYCOSTATIN) Apply topically 2 (two) times daily. (Patient not taking: Reported on 07/12/2023) 30 g 0   oxyCODONE (ROXICODONE) 15 MG immediate release tablet Take 1 tablet (15 mg total) by mouth every 6 (six) hours as needed for breakthrough pain or severe pain. (Patient not taking: Reported on 07/12/2023) 16 tablet 0   zinc sulfate 220 (50 Zn) MG capsule Take 1 capsule (220 mg total) by mouth daily. (Patient not taking: Reported on 07/12/2023) 30 capsule 0   Current Facility-Administered Medications on File Prior to Visit  Medication Dose Route Frequency Provider Last Rate Last Admin   Vitamin D  (Ergocalciferol) (DRISDOL) 1.25 MG (50000 UNIT) capsule 50,000 Units  50,000 Units Oral Once per day on Mon Wed Fri Pace, Brien R, NP        There are no Patient Instructions on file for this visit. No follow-ups on file.   Georgiana Spinner, NP

## 2023-07-17 LAB — AEROBIC CULTURE

## 2023-07-20 IMAGING — CR DG CHEST 2V
1 series · 2 of 2 positions shown · non-contrast
Comparison: 01/18/2022

CLINICAL DATA: Shortness of breath, dyspnea, possible atrial
fibrillation, dizziness, history CHF

EXAM:
CHEST - 2 VIEW

[Series 1: dg chest 2 view · 0.14mm/px · 2 of 2 slices shown]
[im 1/2]
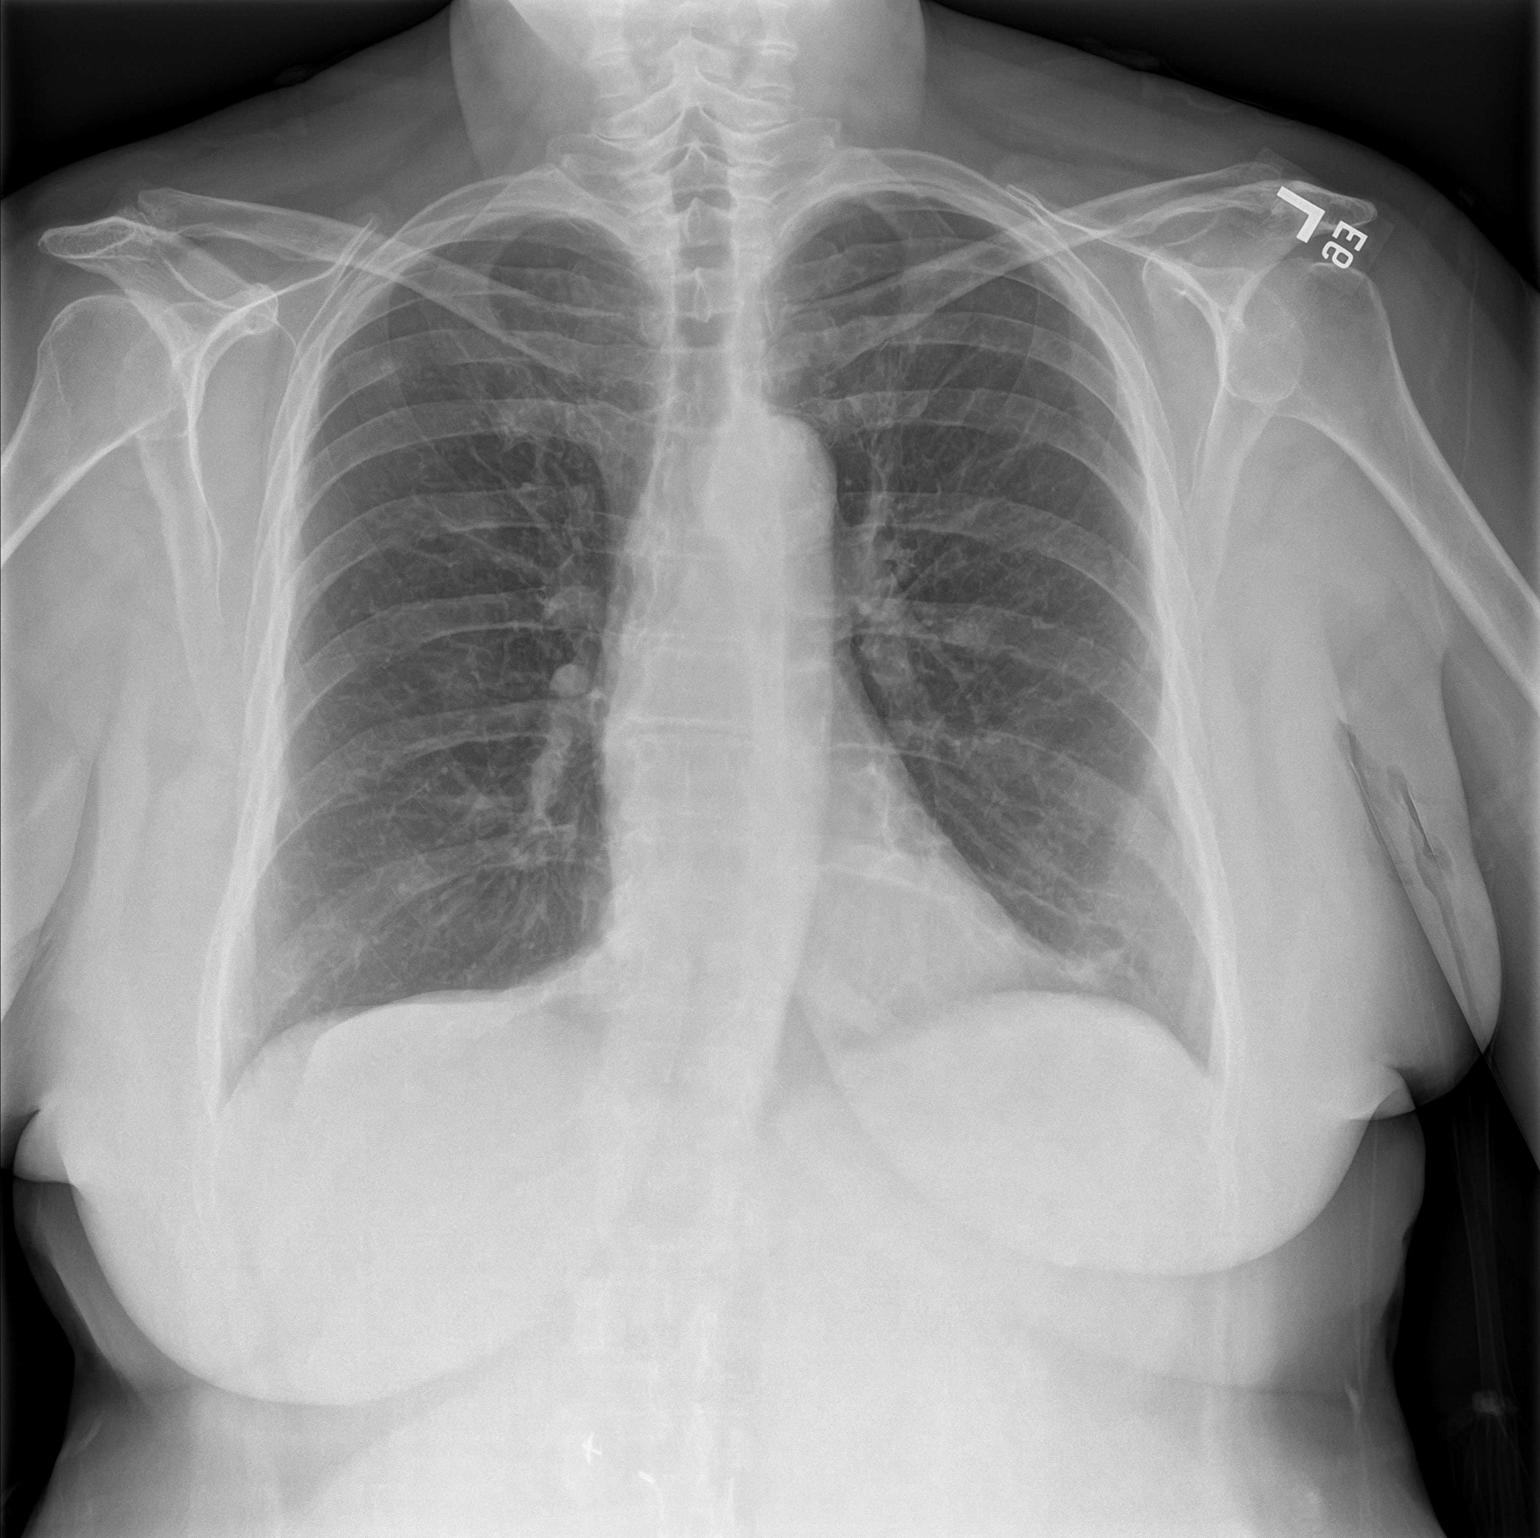
[im 2/2]
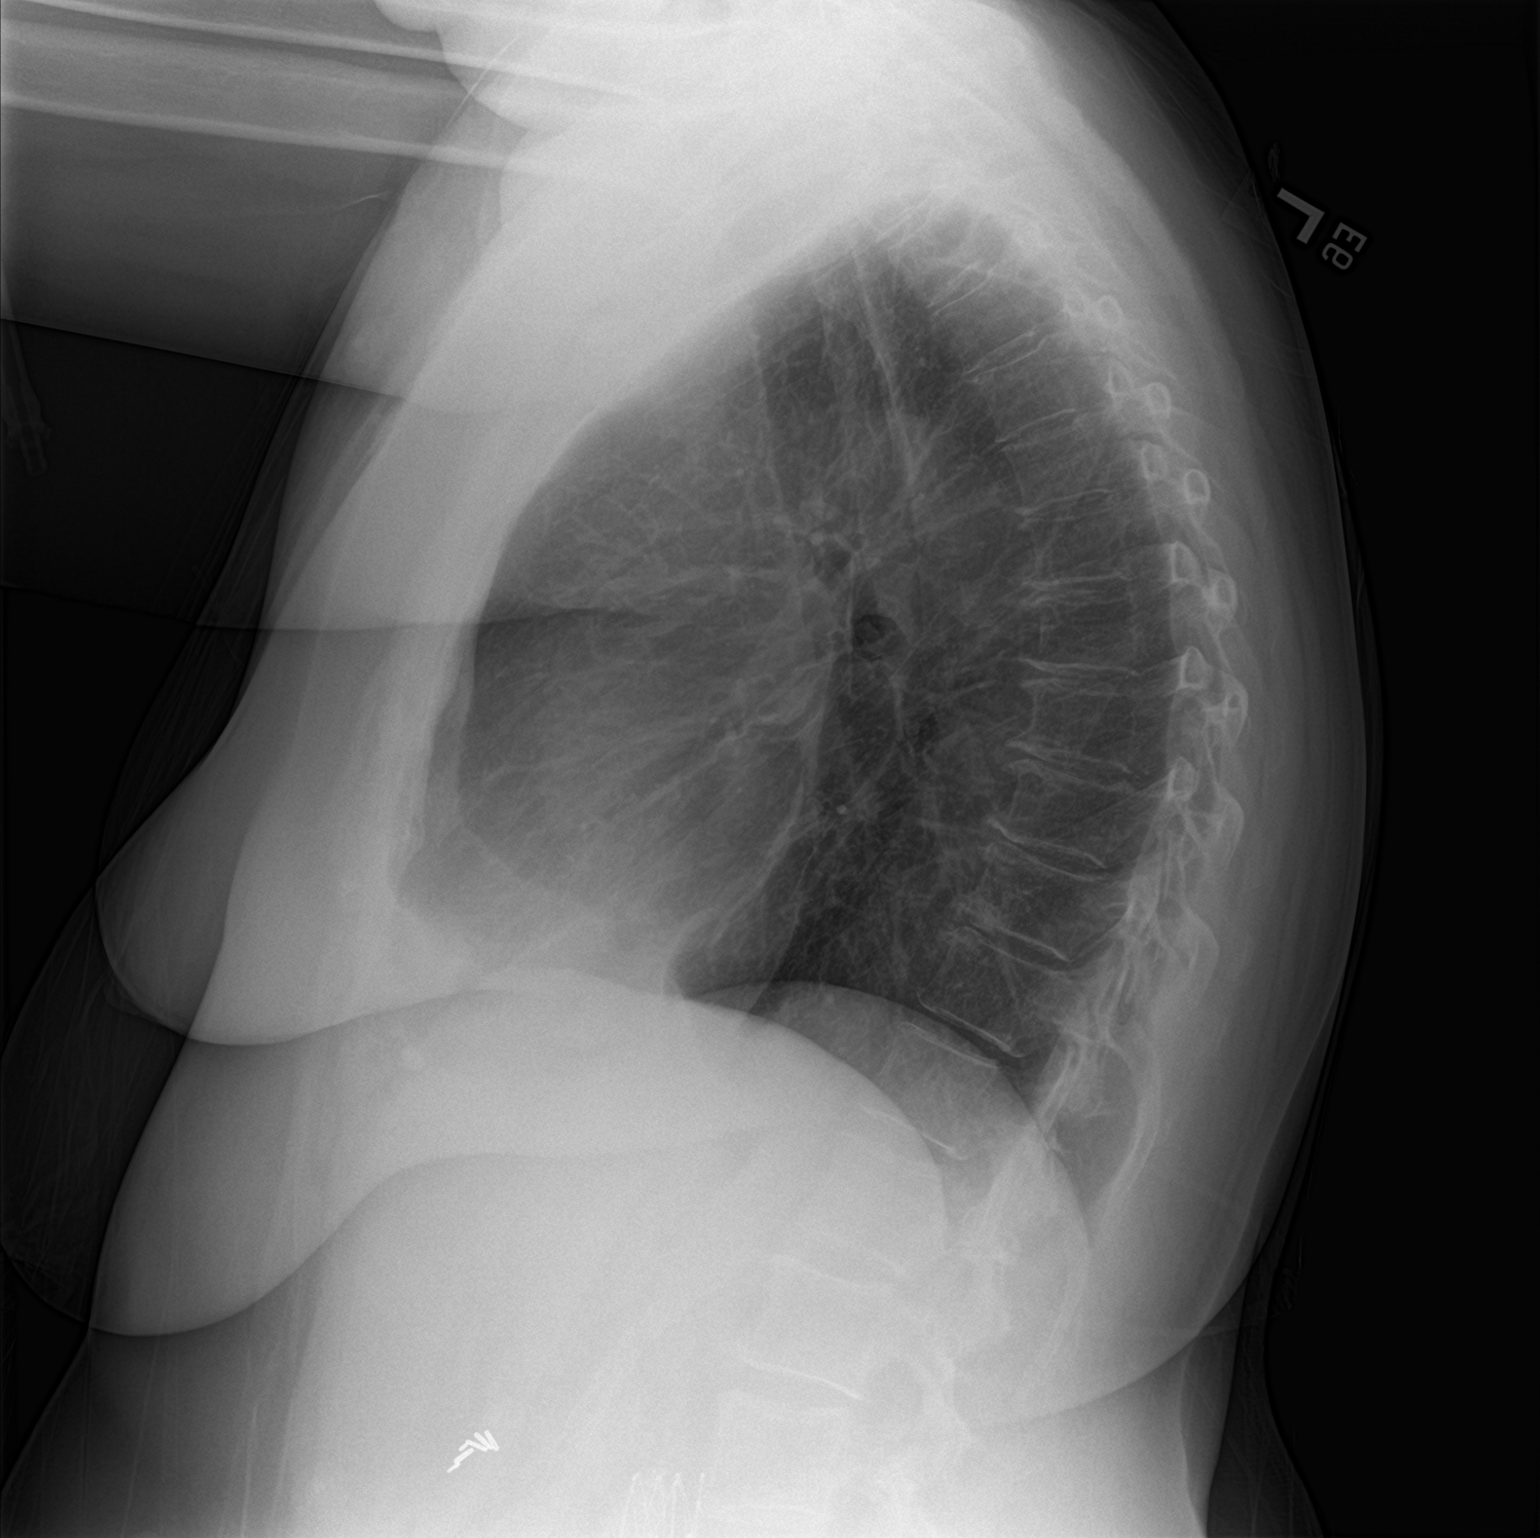

[2 of 2 positions shown; findings below may reference images not displayed]

FINDINGS: Normal heart size, mediastinal contours, and pulmonary vascularity.

Lungs clear.

No pulmonary infiltrate, pleural effusion, or pneumothorax.

No acute osseous findings.
IMPRESSION: No acute abnormalities.

## 2023-07-26 ENCOUNTER — Telehealth (INDEPENDENT_AMBULATORY_CARE_PROVIDER_SITE_OTHER): Payer: Self-pay

## 2023-07-26 NOTE — Telephone Encounter (Signed)
Patient reach out to the office asking when she can start using the shapers to start the process for prothesis. Please Advise

## 2023-07-27 NOTE — Telephone Encounter (Signed)
Patient notified with medical recommendations and verbalized understanding 

## 2023-07-27 NOTE — Telephone Encounter (Signed)
She can start now

## 2023-08-07 ENCOUNTER — Ambulatory Visit (INDEPENDENT_AMBULATORY_CARE_PROVIDER_SITE_OTHER): Payer: 59 | Admitting: Nurse Practitioner

## 2023-08-07 ENCOUNTER — Encounter (INDEPENDENT_AMBULATORY_CARE_PROVIDER_SITE_OTHER): Payer: Self-pay | Admitting: Nurse Practitioner

## 2023-08-07 VITALS — BP 125/68 | HR 67 | Resp 16

## 2023-08-07 DIAGNOSIS — Z89612 Acquired absence of left leg above knee: Secondary | ICD-10-CM

## 2023-08-07 DIAGNOSIS — T8740 Infection of amputation stump, unspecified extremity: Secondary | ICD-10-CM | POA: Diagnosis not present

## 2023-08-07 DIAGNOSIS — G894 Chronic pain syndrome: Secondary | ICD-10-CM

## 2023-08-07 DIAGNOSIS — Z89611 Acquired absence of right leg above knee: Secondary | ICD-10-CM | POA: Diagnosis not present

## 2023-08-07 MED ORDER — LIDOCAINE 5 % EX PTCH: 1.0000 | MEDICATED_PATCH | CUTANEOUS | 5 refills | Status: AC

## 2023-08-13 ENCOUNTER — Encounter (INDEPENDENT_AMBULATORY_CARE_PROVIDER_SITE_OTHER): Payer: Self-pay | Admitting: Nurse Practitioner

## 2023-08-13 NOTE — Progress Notes (Signed)
Subjective:    Patient ID: Carla [name], female    DOB: 12-31-1960, 62 y.o.   MRN: [id number] Chief Complaint  Patient presents with   Follow-up    3 week follow up    The patient is a 62 year old female that returns today for evaluation of her left above-knee amputation site.  She has had significant difficulty over the last several months with both of her amputation sites.  They are both mildly completely healed.  There is no evidence of infection noted bilaterally.  She still continues to note significant tenderness near the medial portions with palpation.  The wounds however are healed and the patient is very motivated to move forward with prosthetics.    Review of Systems  Neurological:  Positive for weakness.  All other systems reviewed and are negative.      Objective:   Physical Exam Vitals reviewed.  HENT:     Head: Normocephalic.  Cardiovascular:     Rate and Rhythm: Normal rate.  Pulmonary:     Effort: Pulmonary effort is normal.  Musculoskeletal:        General: Tenderness present.     Right Lower Extremity: Right leg is amputated above knee.     Left Lower Extremity: Left leg is amputated above knee.  Skin:    General: Skin is warm and dry.  Neurological:     Mental Status: She is alert and oriented to person, place, and time.  Psychiatric:        Mood and Affect: Mood normal.        Behavior: Behavior normal.        Thought Content: Thought content normal.        Judgment: Judgment normal.     BP 125/68 (BP Location: Right Arm)   Pulse 67   Resp 16   Past Medical History:  Diagnosis Date   Abdominal aortic atherosclerosis (HCC)    a. 05/2017 CTA abd/pelvis: significant atherosclerotic dzs of the infrarenal abd Ao w/ some mural thrombus. No aneurysm or dissection.   Acute focal ischemia of small intestine (HCC)    Acute right-sided low back pain with right-sided sciatica 06/13/2017   AKI (acute kidney injury) (HCC) 07/29/2017   Anemia    Anginal  pain (HCC)    a. 08/2012 Lexiscan MV: EF 54%, non ischemia/infarct.   Anxiety and depression    Baker's cyst of knee, right    a. 07/2016 U/S: 4.1 x 1.4 x 2.9 cystic structure in R poplitetal fossa.   Bell's palsy    CAD (coronary artery disease)    CHF (congestive heart failure) (HCC)    a.) TTE 06/20/2017: EF 60-65%, no rwma, G1DD, no source of cardiac emboli.   Chronic anticoagulation    a.) apixaban   Chronic idiopathic constipation    Chronic mesenteric ischemia (HCC)    Chronic pain    COPD (chronic obstructive pulmonary disease) (HCC)    Dyspnea    Embolus of superior mesenteric artery (HCC)    a. 05/2017 CTA Abd/pelvis: apparent thrombus or embolus in prox SMA (70-90%); b. 05/2017 catheter directed tPA, mechanical thrombectomy, and stenting of the SMA.   Essential hypertension with goal blood pressure less than 140/90 01/19/2016   Gastroesophageal reflux disease 01/19/2016   GERD (gastroesophageal reflux disease)    H/O colectomy    History of 2019 novel coronavirus disease (COVID-19) 11/14/2019   History of kidney stones    Hyperlipidemia    Hypertension    Morbid  obesity with BMI of 40.0-44.9, adult (HCC)    NSTEMI (non-ST elevated myocardial infarction) (HCC) 06/27/2021   a.) high sensitivity troponins trended: 893 --> 1587 --> 1748 --> 868 --> 735 ng/L   Occlusive mesenteric ischemia (HCC) 06/19/2017   Osteoarthritis    PAD (peripheral artery disease) (HCC)    PAF (paroxysmal atrial fibrillation) (HCC)    a.) CHA2DS2-VASc = 6 (sex, CHF, HTN, DVT x2, aortic plaque). b.) rate/rhythm maintained on oral metoprolol tartrate; chronically anticoagulated with standard dose apixaban.   Palpitations    Peripheral edema    Personal history of other malignant neoplasm of skin 06/01/2016   Pneumonia    Pre-diabetes    Primary osteoarthritis of both knees 06/13/2017   SBO (small bowel obstruction) (HCC) 08/06/2017   Skin cancer of nose    Superior mesenteric artery thrombosis  (HCC)    Vitamin D deficiency     Social History   Socioeconomic History   Marital status: Single    Spouse name: Not on file   Number of children: Not on file   Years of education: Not on file   Highest education level: Not on file  Occupational History   Occupation: Unemployed  Tobacco Use   Smoking status: Former    Current packs/day: 0.00    Types: Cigarettes    Quit date: 2023    Years since quitting: 1.8   Smokeless tobacco: Never  Vaping Use   Vaping status: Never Used  Substance and Sexual Activity   Alcohol use: No   Drug use: No   Sexual activity: Not Currently  Other Topics Concern   Not on file  Social History Narrative   Lives in [place] with daughter   Social Determinants of Health   Financial Resource Strain: Not on file  Food Insecurity: No Food Insecurity (03/22/2023)   Hunger Vital Sign    Worried About Running Out of Food in the Last Year: Never true    Ran Out of Food in the Last Year: Never true  Transportation Needs: No Transportation Needs (03/22/2023)   PRAPARE - Administrator, Civil Service (Medical): No    Lack of Transportation (Non-Medical): No  Physical Activity: Not on file  Stress: Not on file  Social Connections: Not on file  Intimate Partner Violence: Not At Risk (03/22/2023)   Humiliation, Afraid, Rape, and Kick questionnaire    Fear of Current or Ex-Partner: No    Emotionally Abused: No    Physically Abused: No    Sexually Abused: No    Past Surgical History:  Procedure Laterality Date   AMPUTATION Right 04/06/2022   Procedure: AMPUTATION ABOVE KNEE;  Surgeon: Annice Needy, MD;  Location: [facility];  Service: Vascular;  Laterality: Right;   AMPUTATION  12/07/2022   Procedure: AMPUTATION ABOVE KNEE REVISION WITH RESECTION OF FEMUR;  Surgeon: Annice Needy, MD;  Location: [facility];  Service: Vascular;;   AMPUTATION Left 02/21/2023   Procedure: AMPUTATION ABOVE KNEE;  Surgeon: Annice Needy, MD;  Location: [facility];   Service: General;  Laterality: Left;   AMPUTATION Left 03/22/2023   Procedure: AMPUTATION ABOVE KNEE STUMP DEBIDEMENT;  Surgeon: Annice Needy, MD;  Location: [facility];  Service: Vascular;  Laterality: Left;   AMPUTATION TOE Left 12/09/2019   Procedure: AMPUTATION TOE MPJ left;  Surgeon: Rosetta Posner, DPM;  Location: [facility];  Service: Podiatry;  Laterality: Left;   APPENDECTOMY     APPLICATION OF WOUND VAC  12/01/2021  Procedure: APPLICATION OF WOUND VAC;  Surgeon: Annice Needy, MD;  Location: [facility];  Service: Vascular;;   APPLICATION OF WOUND VAC Right 05/03/2022   Procedure: APPLICATION OF WOUND VAC;  Surgeon: Annice Needy, MD;  Location: [facility];  Service: Vascular;  Laterality: Right;   APPLICATION OF WOUND VAC Left 07/23/2022   Procedure: APPLICATION OF WOUND VAC;  Surgeon: Nada Libman, MD;  Location: [facility];  Service: Vascular;  Laterality: Left;  Prevena   APPLICATION OF WOUND VAC Right 08/28/2022   Procedure: APPLICATION OF WOUND VAC;  Surgeon: Annice Needy, MD;  Location: [facility];  Service: Vascular;  Laterality: Right;   APPLICATION OF WOUND VAC  12/07/2022   Procedure: APPLICATION OF WOUND VAC;  Surgeon: Annice Needy, MD;  Location: [facility];  Service: Vascular;;   APPLICATION OF WOUND VAC Left 03/28/2023   Procedure: LEFT STUMP WOUND VAC EXCHANGE;  Surgeon: Annice Needy, MD;  Location: [facility];  Service: Vascular;  Laterality: Left;   APPLICATION OF WOUND VAC Left 04/02/2023   Procedure: LEFT STUMP WOUND VAC EXCHANGE;  Surgeon: Annice Needy, MD;  Location: [facility];  Service: Vascular;  Laterality: Left;   APPLICATION OF WOUND VAC Left 04/05/2023   Procedure: LEFT STUMP WOUND VAC EXCHANGE;  Surgeon: Annice Needy, MD;  Location: [facility];  Service: Vascular;  Laterality: Left;   APPLICATION OF WOUND VAC Left 04/09/2023   Procedure: LEFT STUMP WOUND VAC EXCHANGE;  Surgeon: Annice Needy, MD;  Location: [facility];  Service: Vascular;  Laterality: Left;   APPLICATION OF WOUND VAC  Left 04/16/2023   Procedure: LEFT STUMP WOUND VAC EXCHANGE;  Surgeon: Annice Needy, MD;  Location: [facility];  Service: Vascular;  Laterality: Left;   APPLICATION OF WOUND VAC Left 04/23/2023   Procedure: LEFT STUMP WOUND VAC EXCHANGE;  Surgeon: Renford Dills, MD;  Location: [facility];  Service: Vascular;  Laterality: Left;   APPLICATION OF WOUND VAC Left 04/30/2023   Procedure: LEFT STUMP WOUND VAC EXCHANGE;  Surgeon: Annice Needy, MD;  Location: [facility];  Service: Vascular;  Laterality: Left;   AXILLARY-FEMORAL BYPASS GRAFT Left 07/23/2022   Procedure: BYPASS GRAFT AXILLA-BIFEMORAL;  Surgeon: Nada Libman, MD;  Location: [facility];  Service: Vascular;  Laterality: Left;   CHOLECYSTECTOMY     COLON SURGERY     COLONOSCOPY WITH PROPOFOL N/A 03/23/2022   Procedure: COLONOSCOPY WITH PROPOFOL;  Surgeon: Wyline Mood, MD;  Location: [facility];  Service: Gastroenterology;  Laterality: N/A;  rectal bleed   CORONARY/GRAFT ACUTE MI REVASCULARIZATION N/A 02/15/2023   Procedure: Coronary/Graft Acute MI Revascularization;  Surgeon: Marykay Lex, MD;  Location: [facility];  Service: Cardiovascular;  Laterality: N/A;   EMBOLECTOMY OR THROMBECTOMY, WITH OR WITHOUT CATHETER; FEMOROPOPLITEAL, AORTOILIAC ARTERY, BY LEG INCISION N/A 06/21/2020   Left - Decomp Fasciotomy Leg; Ant &/Or Lat Compart Only; Midline - Angiography, Visceral, Selective Or Supraselective (With Or Without Flush Aortogram); Left - Revascularize, Endovasc, Open/Percut, Iliac Artery, Unilat, Initial Vessel; W/Translum Stent, W/Angioplasty; Right - Revascularize, Endovasc, Open/Percut, Iliac Artery, Ea Add`L Ipsilateral; W/Translumin Stent, W/Angioplasty; Location [facility];   ENDARTERECTOMY FEMORAL Left 12/01/2021   Procedure: ENDARTERECTOMY FEMORAL;  Surgeon: Annice Needy, MD;  Location: [facility];  Service: Vascular;  Laterality: Left;   ENDOVASCULAR REPAIR/STENT GRAFT Left 12/01/2021   Procedure: ENDOVASCULAR REPAIR/STENT GRAFT;   Surgeon: Annice Needy, MD;  Location: [facility];  Service: Cardiovascular;  Laterality: Left;  INCISION AND DRAINAGE OF WOUND Right 05/03/2022   Procedure: IRRIGATION AND DEBRIDEMENT RIGHT STUMP;  Surgeon: Annice Needy, MD;  Location: [facility];  Service: Vascular;  Laterality: Right;   IRRIGATION AND DEBRIDEMENT ABSCESS Right 08/28/2022   Procedure: AKA IRRIGATION AND DEBRIDEMENT ABSCESS;  Surgeon: Annice Needy, MD;  Location: [facility];  Service: Vascular;  Laterality: Right;   LAPAROTOMY N/A 08/08/2017   Procedure: EXPLORATORY LAPAROTOMY POSSIBLE BOWEL RESECTION;  Surgeon: Leafy Ro, MD;  Location: [facility];  Service: General;  Laterality: N/A;   LEFT HEART CATH AND CORONARY ANGIOGRAPHY N/A 05/17/2021   Procedure: LEFT HEART CATH AND CORONARY ANGIOGRAPHY;  Surgeon: Alwyn Pea, MD;  Location: [facility];  Service: Cardiovascular;  Laterality: N/A;   LEFT HEART CATH AND CORONARY ANGIOGRAPHY N/A 02/15/2023   Procedure: LEFT HEART CATH AND CORONARY ANGIOGRAPHY;  Surgeon: Marykay Lex, MD;  Location: [facility];  Service: Cardiovascular;  Laterality: N/A;   LOWER EXTREMITY ANGIOGRAPHY Left 11/17/2019   Procedure: LOWER EXTREMITY ANGIOGRAPHY;  Surgeon: Annice Needy, MD;  Location: [facility];  Service: Cardiovascular;  Laterality: Left;   LOWER EXTREMITY ANGIOGRAPHY Right 12/13/2020   Procedure: LOWER EXTREMITY ANGIOGRAPHY;  Surgeon: Annice Needy, MD;  Location: [facility];  Service: Cardiovascular;  Laterality: Right;   LOWER EXTREMITY ANGIOGRAPHY Right 01/10/2021   Procedure: LOWER EXTREMITY ANGIOGRAPHY;  Surgeon: Annice Needy, MD;  Location: [facility];  Service: Cardiovascular;  Laterality: Right;   LOWER EXTREMITY ANGIOGRAPHY Right 01/24/2021   Procedure: LOWER EXTREMITY ANGIOGRAPHY;  Surgeon: Annice Needy, MD;  Location: [facility];  Service: Cardiovascular;  Laterality: Right;   LOWER EXTREMITY ANGIOGRAPHY  Bilateral 06/28/2021   Procedure: Lower Extremity Angiography;  Surgeon: Annice Needy, MD;  Location: [facility];  Service: Cardiovascular;  Laterality: Bilateral;   LOWER EXTREMITY ANGIOGRAPHY Right 11/28/2021   Procedure: LOWER EXTREMITY ANGIOGRAPHY;  Surgeon: Annice Needy, MD;  Location: [facility];  Service: Cardiovascular;  Laterality: Right;   LOWER EXTREMITY ANGIOGRAPHY Left 11/30/2021   Procedure: Lower Extremity Angiography;  Surgeon: Annice Needy, MD;  Location: [facility];  Service: Cardiovascular;  Laterality: Left;   LOWER EXTREMITY ANGIOGRAPHY Right 02/22/2022   Procedure: Lower Extremity Angiography;  Surgeon: Annice Needy, MD;  Location: [facility];  Service: Cardiovascular;  Laterality: Right;   LOWER EXTREMITY ANGIOGRAPHY Right 03/13/2022   Procedure: Lower Extremity Angiography;  Surgeon: Annice Needy, MD;  Location: [facility];  Service: Cardiovascular;  Laterality: Right;   LOWER EXTREMITY ANGIOGRAPHY Right 03/24/2022   Procedure: Lower Extremity Angiography;  Surgeon: Annice Needy, MD;  Location: [facility];  Service: Cardiovascular;  Laterality: Right;   LOWER EXTREMITY ANGIOGRAPHY Left 01/17/2023   Procedure: Lower Extremity Angiography;  Surgeon: Annice Needy, MD;  Location: [facility];  Service: Cardiovascular;  Laterality: Left;   LOWER EXTREMITY INTERVENTION N/A 11/19/2019   Procedure: LOWER EXTREMITY INTERVENTION;  Surgeon: Annice Needy, MD;  Location: [facility];  Service: Cardiovascular;  Laterality: N/A;   LOWER EXTREMITY INTERVENTION Bilateral 06/29/2021   Procedure: LOWER EXTREMITY INTERVENTION;  Surgeon: Annice Needy, MD;  Location: [facility];  Service: Cardiovascular;  Laterality: Bilateral;   LOWER EXTREMITY INTERVENTION Left 01/18/2023   Procedure: LOWER EXTREMITY INTERVENTION;  Surgeon: Annice Needy, MD;  Location: [facility];  Service: Cardiovascular;  Laterality:  Left;    TEE WITHOUT CARDIOVERSION N/A 06/22/2017   Procedure: TRANSESOPHAGEAL ECHOCARDIOGRAM (TEE);  Surgeon: Iran Ouch, MD;  Location: [facility];  Service: Cardiovascular;  Laterality: N/A;   TOTAL KNEE ARTHROPLASTY Right 07/11/2018   Procedure: TOTAL KNEE ARTHROPLASTY;  Surgeon: Christena Flake, MD;  Location: [facility];  Service: Orthopedics;  Laterality: Right;   VAGINAL HYSTERECTOMY     VISCERAL ARTERY INTERVENTION N/A 06/20/2017   Procedure: Visceral Artery Intervention, possible aortic thrombectomy;  Surgeon: Annice Needy, MD;  Location: [facility];  Service: Cardiovascular;  Laterality: N/A;   VISCERAL ARTERY INTERVENTION N/A 01/28/2018   Procedure: VISCERAL ARTERY INTERVENTION;  Surgeon: Annice Needy, MD;  Location: [facility];  Service: Cardiovascular;  Laterality: N/A;    Family History  Problem Relation Age of Onset   Hypertension Mother    Heart disease Mother    Heart attack Mother    Breast cancer Mother    Heart disease Father    Hypertension Father    Breast cancer Paternal Aunt     Allergies  Allergen Reactions   Gabapentin     Tremors    Bactrim [Sulfamethoxazole-Trimethoprim] Hives, Itching and Other (See Comments)    Family member states she has burning sensation from the inside out       Latest Ref Rng & Units 04/16/2023    3:34 AM 04/13/2023    6:28 AM 04/11/2023    5:15 AM  CBC  WBC 4.0 - 10.5 K/uL 5.5  5.7  5.2   Hemoglobin 12.0 - 15.0 g/dL 8.7  8.4  8.2   Hematocrit 36.0 - 46.0 % 29.2  28.3  27.4   Platelets 150 - 400 K/uL 283  296  307       CMP     Component Value Date/Time   NA 138 04/16/2023 0334   NA 140 02/12/2015 1137   K 4.2 04/16/2023 0334   K 2.9 (L) 02/12/2015 1137   CL 105 04/16/2023 0334   CL 107 02/12/2015 1137   CO2 27 04/16/2023 0334   CO2 27 02/12/2015 1137   GLUCOSE 94 04/16/2023 0334   GLUCOSE 120 (H) 02/12/2015 1137   BUN 12 04/16/2023 0334   BUN 15 02/12/2015 1137   CREATININE 0.46 04/16/2023 0334    CREATININE 0.78 02/12/2015 1137   CALCIUM 8.6 (L) 04/16/2023 0334   CALCIUM 9.0 02/12/2015 1137   PROT 6.3 (L) 04/17/2023 0527   PROT 7.0 09/14/2020 1139   PROT 7.1 01/19/2015 1112   ALBUMIN 2.6 (L) 04/17/2023 0527   ALBUMIN 3.9 09/14/2020 1139   ALBUMIN 3.8 01/19/2015 1112   AST 10 (L) 04/17/2023 0527   AST 17 01/19/2015 1112   ALT 10 04/17/2023 0527   ALT 11 (L) 01/19/2015 1112   ALKPHOS 93 04/17/2023 0527   ALKPHOS 84 01/19/2015 1112   BILITOT 0.2 (L) 04/17/2023 0527   BILITOT <0.2 09/14/2020 1139   BILITOT 0.5 01/19/2015 1112   EGFR 90.0 01/02/2023 0745   GFRNONAA >60 04/16/2023 0334   GFRNONAA >60 02/12/2015 1137     No results found.     Assessment & Plan:   1. Chronic pain syndrome The patient has significant issues with pain and she is receiving chronic pain management by her PCP currently.  Based on that I have discussed with her that we would not be able to send in any oral medication for pain for her.  However given the pain and tenderness she is  having in her residual hands we have offered a lidocaine patch that she can use over these areas that are uncomfortable.  I have also offered referral to pain management as a suspect some of the pain is related to possible phantom pain and the pain that she had previously in her lower back area.  However we discussed that they may want to use injections or spinal cord stimulators, she was absolutely against the idea of a referral at this time.  2. Amputation stump infection (HCC) No evidence of infection noted today  3. S/P AKA (above knee amputation) bilateral (HCC) Carla [name] was seen for further evaluation for bilateral above knee prosthesis.She Is a 62 year old female who had amputation on 03/27/2022 and 02/21/2023.  At the time he is well healed and ready for fitting of new above knee prosthesis.  She is a highly motivated individual and should do well once fitted with prosthesis.  She has no problem returning to a K2  ambulator, which will allow him to walk inside and outside of his home and overload level barriers.   Current Outpatient Medications on File Prior to Visit  Medication Sig Dispense Refill   apixaban (ELIQUIS) 5 MG TABS tablet Take 1 tablet (5 mg total) by mouth 2 (two) times daily. 60 tablet 11   atorvastatin (LIPITOR) 80 MG tablet Take 80 mg by mouth at bedtime.     COMBIVENT RESPIMAT 20-100 MCG/ACT AERS respimat Inhale 1 puff into the lungs every 6 (six) hours as needed.     doxycycline (VIBRAMYCIN) 100 MG capsule Take 1 capsule (100 mg total) by mouth 2 (two) times daily. 20 capsule 0   esomeprazole (NEXIUM) 20 MG capsule Take 20 mg by mouth daily.     feeding supplement (ENSURE ENLIVE / ENSURE PLUS) LIQD Take 237 mLs by mouth 2 (two) times daily between meals. 237 mL 12   metoprolol tartrate (LOPRESSOR) 25 MG tablet Take 0.5 tablets (12.5 mg total) by mouth 2 (two) times daily. 30 tablet 0   oxyCODONE (OXY IR/ROXICODONE) 5 MG immediate release tablet SMARTSIG:2 Tablet(s) By Mouth     polyethylene glycol (MIRALAX / GLYCOLAX) 17 g packet Take 17 g by mouth daily as needed for severe constipation. 30 each 0   pregabalin (LYRICA) 200 MG capsule Take 1 capsule (200 mg total) by mouth in the morning, at noon, and at bedtime. 90 capsule 3   ascorbic acid (VITAMIN C) 500 MG tablet Take 1 tablet (500 mg total) by mouth 2 (two) times daily. (Patient not taking: Reported on 07/12/2023) 30 tablet 0   diphenoxylate-atropine (LOMOTIL) 2.5-0.025 MG tablet Take 1 tablet by mouth 4 (four) times daily. (Patient not taking: Reported on 07/12/2023)     ferrous sulfate 325 (65 FE) MG tablet Take 325 mg by mouth daily. (Patient not taking: Reported on 04/30/2023)     folic acid (FOLVITE) 1 MG tablet Take 1 tablet (1 mg total) by mouth daily. (Patient not taking: Reported on 07/12/2023) 30 tablet 0   isosorbide mononitrate (IMDUR) 120 MG 24 hr tablet Take 1 tablet (120 mg total) by mouth daily. (Patient not taking:  Reported on 07/12/2023) 30 tablet 1   midodrine (PROAMATINE) 2.5 MG tablet Take 1 tablet (2.5 mg total) by mouth 3 (three) times daily with meals. (Patient not taking: Reported on 07/12/2023) 90 tablet 0   nystatin cream (MYCOSTATIN) Apply topically 2 (two) times daily. (Patient not taking: Reported on 07/12/2023) 30 g 0   oxyCODONE (ROXICODONE) 15  MG immediate release tablet Take 1 tablet (15 mg total) by mouth every 6 (six) hours as needed for breakthrough pain or severe pain. (Patient not taking: Reported on 07/12/2023) 16 tablet 0   zinc sulfate 220 (50 Zn) MG capsule Take 1 capsule (220 mg total) by mouth daily. (Patient not taking: Reported on 07/12/2023) 30 capsule 0   Current Facility-Administered Medications on File Prior to Visit  Medication Dose Route Frequency Provider Last Rate Last Admin   Vitamin D (Ergocalciferol) (DRISDOL) 1.25 MG (50000 UNIT) capsule 50,000 Units  50,000 Units Oral Once per day on Mon Wed Fri Pace, Brien R, NP        There are no Patient Instructions on file for this visit. No follow-ups on file.   Georgiana Spinner, NP

## 2023-08-16 ENCOUNTER — Telehealth (INDEPENDENT_AMBULATORY_CARE_PROVIDER_SITE_OTHER): Payer: Self-pay

## 2023-08-16 NOTE — Telephone Encounter (Signed)
Patient called stating she needs something faxed to [facility] in order to get therapist to come out to her house.   Please advise

## 2023-08-29 ENCOUNTER — Telehealth (INDEPENDENT_AMBULATORY_CARE_PROVIDER_SITE_OTHER): Payer: Self-pay

## 2023-08-29 NOTE — Telephone Encounter (Signed)
Patient notified and referral will be placed by provider

## 2023-08-29 NOTE — Telephone Encounter (Signed)
Patient left a message stating that she having a lot pain with the left stump towards the end of the incision for about a week. Please Advise

## 2023-09-03 NOTE — Telephone Encounter (Signed)
We can refer her to a pain clinic but she is getting a lot of pain medication from her PCP I believe.  If we refer her to pain management, it may end.  Does she still want to do that?

## 2023-09-03 NOTE — Telephone Encounter (Signed)
Could you contact patient to schedule appointment

## 2023-09-03 NOTE — Telephone Encounter (Signed)
She can come in and see Carla [name]

## 2023-09-03 NOTE — Telephone Encounter (Signed)
Patient does not want to move forward with referral but she states that she is still having same pain with left stump.

## 2023-09-03 NOTE — Telephone Encounter (Signed)
I suspect it is nerve related as we discussed.  She can continue with use of lidocaine patches

## 2023-09-03 NOTE — Telephone Encounter (Signed)
Patient was informed with medical recommendations but she feels like it is something wrong with bone in the stump area.

## 2023-09-04 IMAGING — CT CT ABD-PELV W/O CM
2 of 4 series · 16 of 46 positions shown, 18 images · non-contrast
Comparison: 10/01/2020

CLINICAL DATA: Rectal bleeding for 4 days, loose stools, right
lower quadrant pain

EXAM:
CT ABDOMEN AND PELVIS WITHOUT CONTRAST
TECHNIQUE: Multidetector CT imaging of the abdomen and pelvis was performed
following the standard protocol without IV contrast. Unenhanced CT
was performed per clinician order. Lack of IV contrast limits
sensitivity and specificity, especially for evaluation of
abdominal/pelvic solid viscera and vascular structures.
RADIATION DOSE REDUCTION: This exam was performed according to the
departmental dose-optimization program which includes automated
exposure control, adjustment of the mA and/or kV according to
patient size and/or use of iterative reconstruction technique.

[Series 2: axial st · axial · 0.98mm/px · z∈[-517,-82]mm · 13 of 95 slices shown, 15 images]
[im 4/95  soft-tissue]
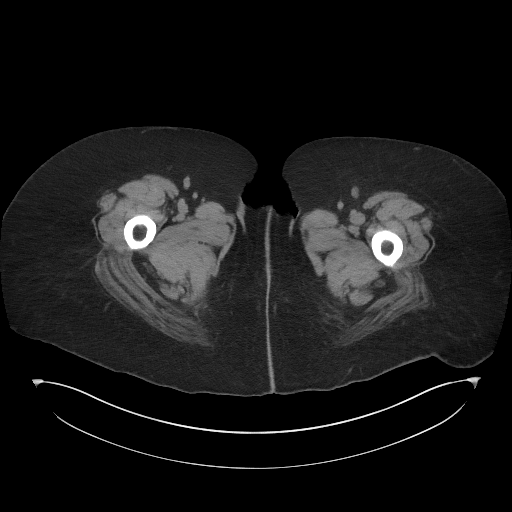
[im 4/95  bone]
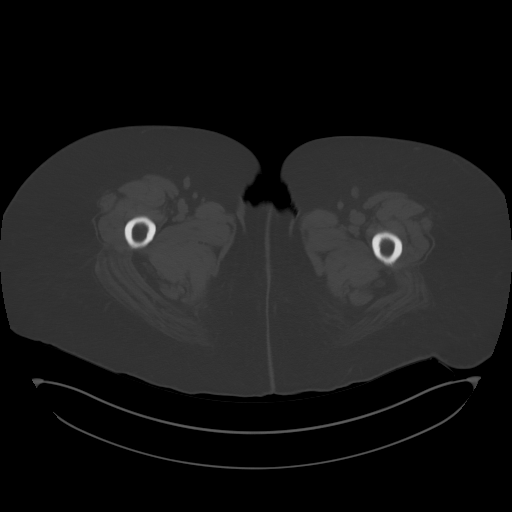
[im 11/95  soft-tissue]
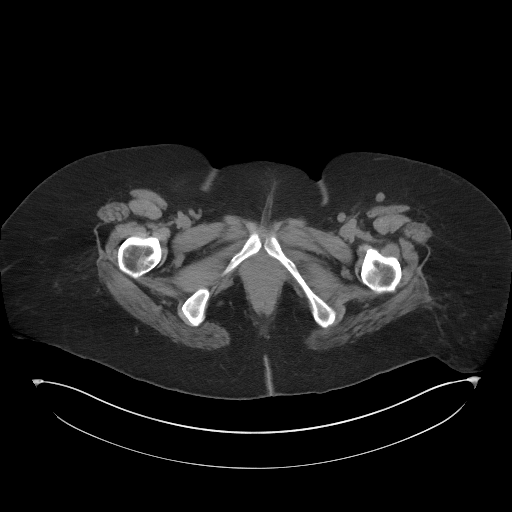
[im 19/95  soft-tissue]
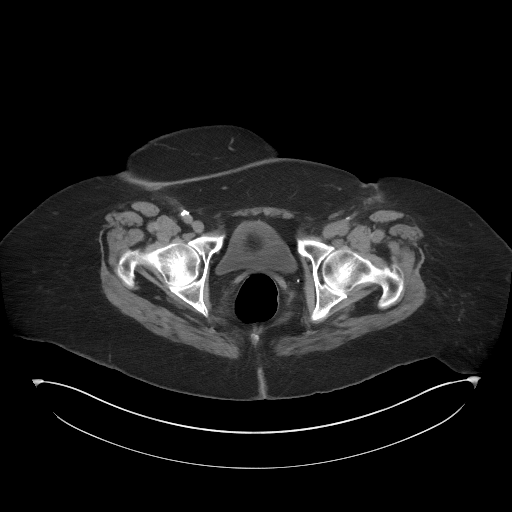
[im 26/95  soft-tissue]
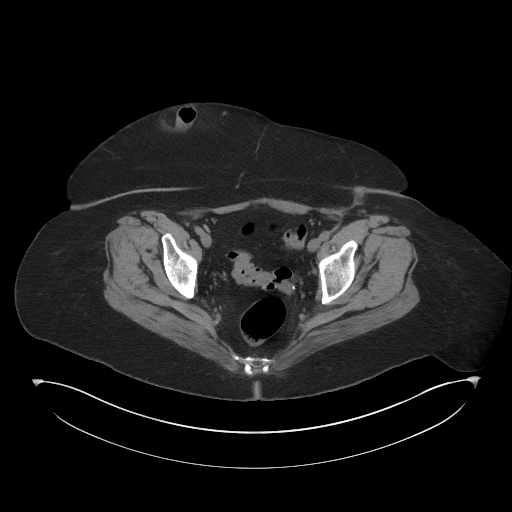
[im 33/95  soft-tissue]
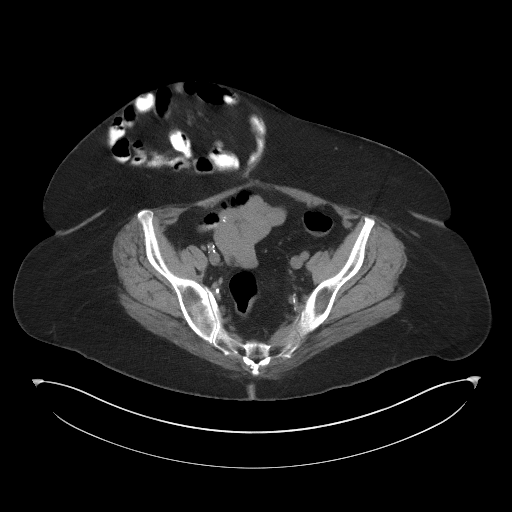
[im 40/95  soft-tissue]
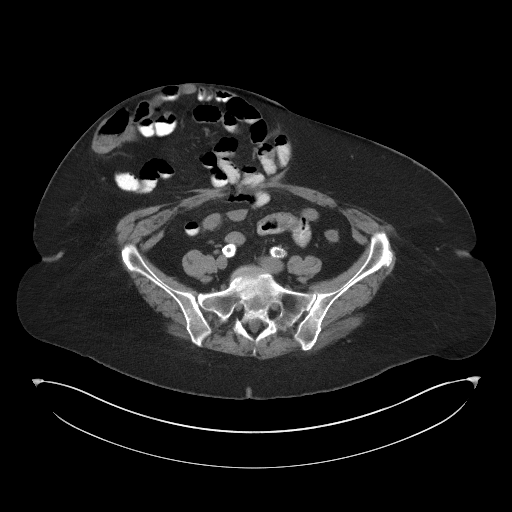
[im 48/95  soft-tissue]
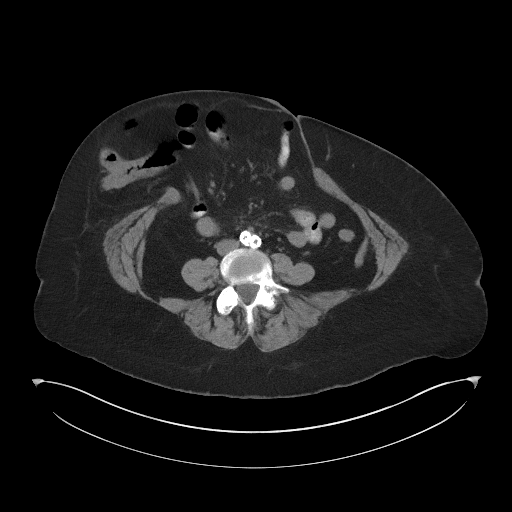
[im 55/95  soft-tissue]
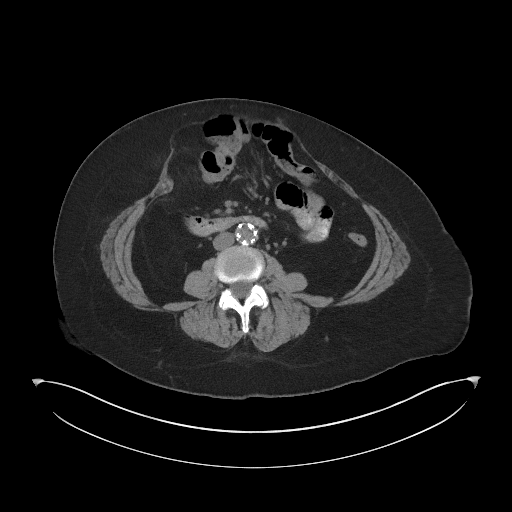
[im 62/95  soft-tissue]
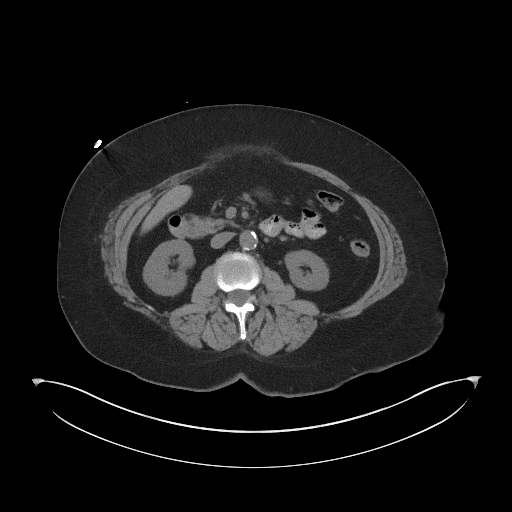
[im 62/95  bone]
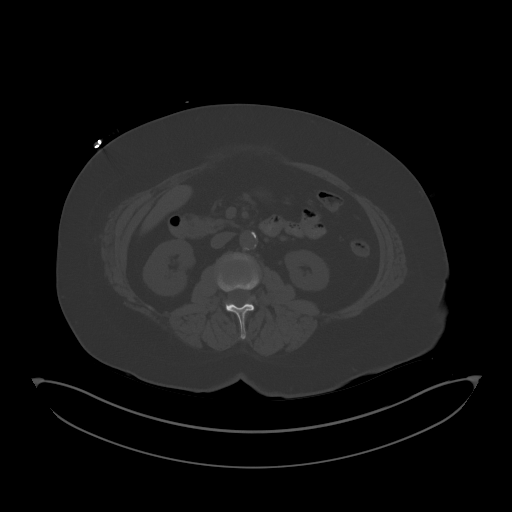
[im 69/95  soft-tissue]
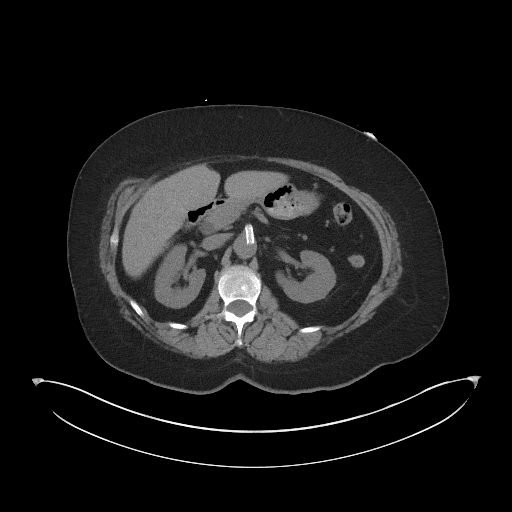
[im 76/95  soft-tissue]
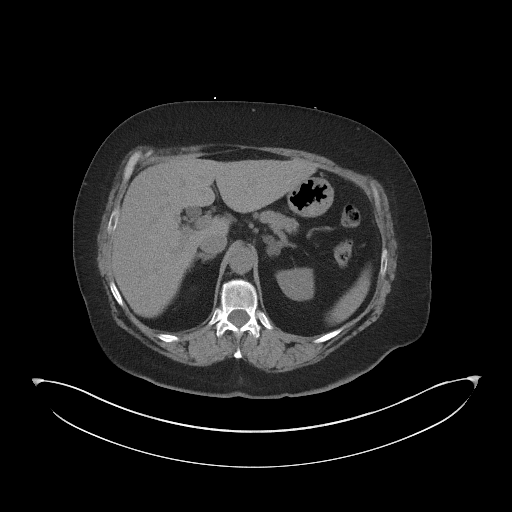
[im 84/95  soft-tissue]
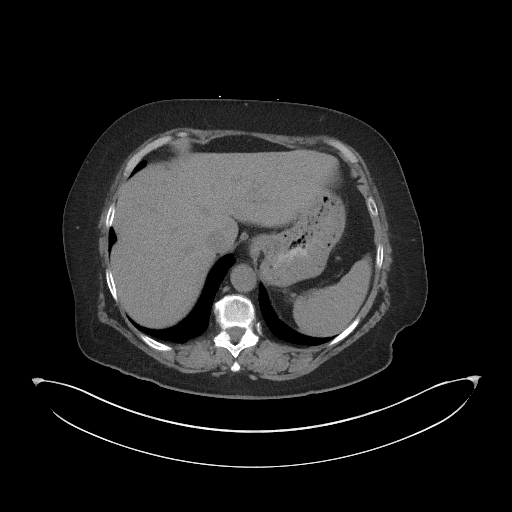
[im 91/95  soft-tissue]
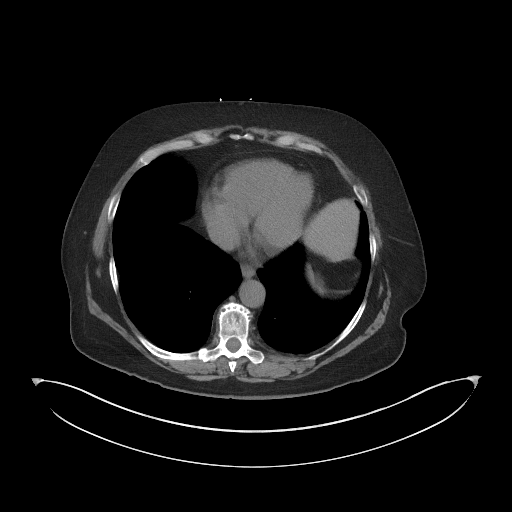

[Series 5: coronal st · coronal · 0.97mm/px · 3 of 102 slices shown]
[im 34/102  soft-tissue]
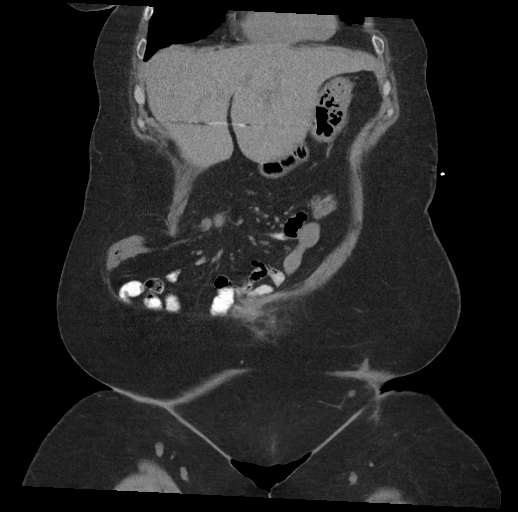
[im 45/102  soft-tissue]
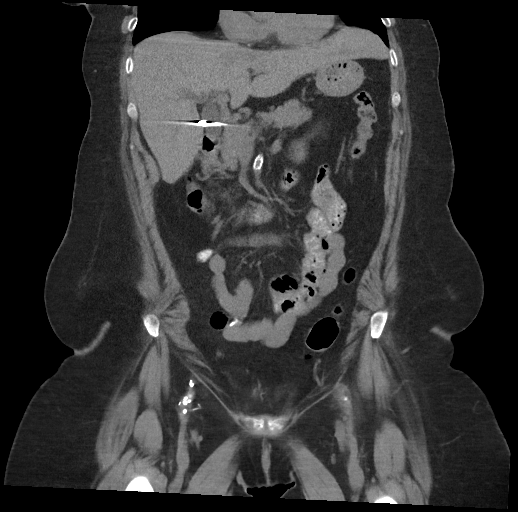
[im 57/102  soft-tissue]
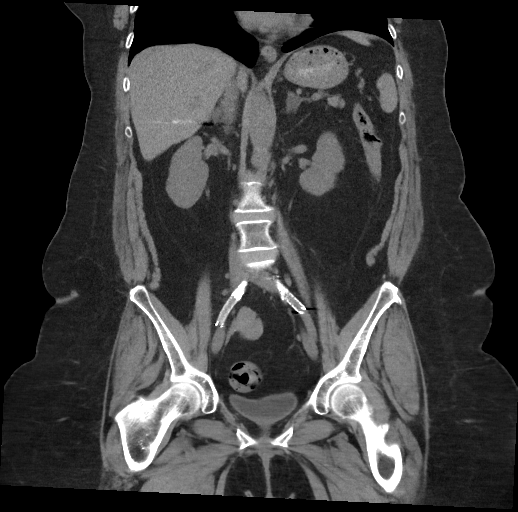

[16 of 46 positions shown; findings below may reference images not displayed]

FINDINGS: Lower chest: No acute pleural or parenchymal lung disease.

Hepatobiliary: Cholecystectomy. Unremarkable unenhanced appearance
of the liver.

Pancreas: Unremarkable unenhanced appearance.

Spleen: Unremarkable unenhanced appearance.

Adrenals/Urinary Tract: Chronic left adrenal thickening measuring up
to 15 mm, with attenuation of -4 HU, consistent with adenoma or
hyperplasia. No specific follow-up is required given stability and
imaging features. Right adrenal is unremarkable. No urinary tract
calculi or obstructive uropathy within either kidney. Bladder is
grossly normal.

Stomach/Bowel: No bowel obstruction or ileus. Large right lower
quadrant ventral hernia is again identified, containing multiple
loops of large and small bowel. No evidence of incarceration or
bowel obstruction. No bowel wall thickening or inflammatory change.

Vascular/Lymphatic: Stable aortic atherosclerosis. Stable SMA stent
and distal aortic endoluminal stent graft. Evaluation of the
vascular lumen is limited without IV contrast.

No pathologic adenopathy.

Reproductive: Status post hysterectomy. No adnexal masses.

Other: No free fluid or free intraperitoneal gas. Large right lower
quadrant ventral hernia as described above.

Musculoskeletal: No acute or destructive bony lesions. Reconstructed
images demonstrate no additional findings.
IMPRESSION: 1. Large right lower quadrant ventral hernia unchanged. No evidence
of incarceration or bowel obstruction.
2. No acute intra-abdominal or intrapelvic process.
3.  Aortic Atherosclerosis ([id number]).

## 2023-09-11 ENCOUNTER — Ambulatory Visit (INDEPENDENT_AMBULATORY_CARE_PROVIDER_SITE_OTHER): Payer: 59 | Admitting: Vascular Surgery

## 2023-09-11 ENCOUNTER — Encounter (INDEPENDENT_AMBULATORY_CARE_PROVIDER_SITE_OTHER): Payer: Self-pay | Admitting: Vascular Surgery

## 2023-09-11 VITALS — BP 136/81 | HR 65 | Resp 15

## 2023-09-11 DIAGNOSIS — G894 Chronic pain syndrome: Secondary | ICD-10-CM | POA: Diagnosis not present

## 2023-09-11 DIAGNOSIS — I7090 Unspecified atherosclerosis: Secondary | ICD-10-CM | POA: Diagnosis not present

## 2023-09-11 DIAGNOSIS — I10 Essential (primary) hypertension: Secondary | ICD-10-CM | POA: Diagnosis not present

## 2023-09-11 DIAGNOSIS — K436 Other and unspecified ventral hernia with obstruction, without gangrene: Secondary | ICD-10-CM

## 2023-09-11 NOTE — Progress Notes (Signed)
MRN : [id number]  Carla [name] is a 62 y.o. ([birth year]) female who presents with chief complaint of  Chief Complaint  Patient presents with   Follow-up    Left stump pain  .  History of Present Illness: Patient returns today in follow up of her severe hypercoagulable state with multiple previous surgeries and interventions that have thrombosed.  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  She still complains of severe and chronic pain particular from the left stump.  The wound has been healed from some time and there is no erythema or drainage.  She is on chronic narcotics by her primary care physician due to her severe pain.  She is also on Lyrica and other agents to try to improve her quality of life.  No fevers or chills.  No current wounds.  Current Outpatient Medications  Medication Sig Dispense Refill   apixaban (ELIQUIS) 5 MG TABS tablet Take 1 tablet (5 mg total) by mouth 2 (two) times daily. 60 tablet 11   atorvastatin (LIPITOR) 80 MG tablet Take 80 mg by mouth at bedtime.     COMBIVENT RESPIMAT 20-100 MCG/ACT AERS respimat Inhale 1 puff into the lungs every 6 (six) hours as needed.     doxycycline (VIBRAMYCIN) 100 MG capsule Take 1 capsule (100 mg total) by mouth 2 (two) times daily. 20 capsule 0   esomeprazole (NEXIUM) 20 MG capsule Take 20 mg by mouth daily.     feeding supplement (ENSURE ENLIVE / ENSURE PLUS) LIQD Take 237 mLs by mouth 2 (two) times daily between meals. 237 mL 12   lidocaine (LIDODERM) 5 % Place 1 patch onto the skin daily. Remove & Discard patch within 12 hours or as directed by MD 30 patch 5   metoprolol tartrate (LOPRESSOR) 25 MG tablet Take 0.5 tablets (12.5 mg total) by mouth 2 (two) times daily. 30 tablet 0   oxyCODONE (OXY IR/ROXICODONE) 5 MG immediate release tablet SMARTSIG:2 Tablet(s) By Mouth     polyethylene glycol (MIRALAX / GLYCOLAX) 17 g packet Take 17 g by mouth daily as needed for severe constipation. 30  each 0   pregabalin (LYRICA) 200 MG capsule Take 1 capsule (200 mg total) by mouth in the morning, at noon, and at bedtime. 90 capsule 3   ascorbic acid (VITAMIN C) 500 MG tablet Take 1 tablet (500 mg total) by mouth 2 (two) times daily. (Patient not taking: Reported on 07/12/2023) 30 tablet 0   diphenoxylate-atropine (LOMOTIL) 2.5-0.025 MG tablet Take 1 tablet by mouth 4 (four) times daily. (Patient not taking: Reported on 07/12/2023)     ferrous sulfate 325 (65 FE) MG tablet Take 325 mg by mouth daily. (Patient not taking: Reported on 04/30/2023)     folic acid (FOLVITE) 1 MG tablet Take 1 tablet (1 mg total) by mouth daily. (Patient not taking: Reported on 07/12/2023) 30 tablet 0   isosorbide mononitrate (IMDUR) 120 MG 24 hr tablet Take 1 tablet (120 mg total) by mouth daily. (Patient not taking: Reported on 07/12/2023) 30 tablet 1   midodrine (PROAMATINE) 2.5 MG tablet Take 1 tablet (2.5 mg total) by mouth 3 (three) times daily with meals. (Patient not taking: Reported on 07/12/2023) 90 tablet 0   nystatin cream (MYCOSTATIN) Apply topically 2 (two) times daily. (Patient not taking: Reported on 07/12/2023) 30 g 0   oxyCODONE (ROXICODONE) 15 MG immediate release tablet Take 1 tablet (15 mg total) by mouth every 6 (six) hours  as needed for breakthrough pain or severe pain. (Patient not taking: Reported on 07/12/2023) 16 tablet 0   zinc sulfate 220 (50 Zn) MG capsule Take 1 capsule (220 mg total) by mouth daily. (Patient not taking: Reported on 07/12/2023) 30 capsule 0   Current Facility-Administered Medications  Medication Dose Route Frequency Provider Last Rate Last Admin   Vitamin D (Ergocalciferol) (DRISDOL) 1.25 MG (50000 UNIT) capsule 50,000 Units  50,000 Units Oral Once per day on Mon Wed Fri Pace, Brien R, NP        Past Medical History:  Diagnosis Date   Abdominal aortic atherosclerosis (HCC)    a. 05/2017 CTA abd/pelvis: significant atherosclerotic dzs of the infrarenal abd Ao w/ some mural  thrombus. No aneurysm or dissection.   Acute focal ischemia of small intestine (HCC)    Acute right-sided low back pain with right-sided sciatica 06/13/2017   AKI (acute kidney injury) (HCC) 07/29/2017   Anemia    Anginal pain (HCC)    a. 08/2012 Lexiscan MV: EF 54%, non ischemia/infarct.   Anxiety and depression    Baker's cyst of knee, right    a. 07/2016 U/S: 4.1 x 1.4 x 2.9 cystic structure in R poplitetal fossa.   Bell's palsy    CAD (coronary artery disease)    CHF (congestive heart failure) (HCC)    a.) TTE 06/20/2017: EF 60-65%, no rwma, G1DD, no source of cardiac emboli.   Chronic anticoagulation    a.) apixaban   Chronic idiopathic constipation    Chronic mesenteric ischemia (HCC)    Chronic pain    COPD (chronic obstructive pulmonary disease) (HCC)    Dyspnea    Embolus of superior mesenteric artery (HCC)    a. 05/2017 CTA Abd/pelvis: apparent thrombus or embolus in prox SMA (70-90%); b. 05/2017 catheter directed tPA, mechanical thrombectomy, and stenting of the SMA.   Essential hypertension with goal blood pressure less than 140/90 01/19/2016   Gastroesophageal reflux disease 01/19/2016   GERD (gastroesophageal reflux disease)    H/O colectomy    History of 2019 novel coronavirus disease (COVID-19) 11/14/2019   History of kidney stones    Hyperlipidemia    Hypertension    Morbid obesity with BMI of 40.0-44.9, adult (HCC)    NSTEMI (non-ST elevated myocardial infarction) (HCC) 06/27/2021   a.) high sensitivity troponins trended: 893 --> 1587 --> 1748 --> 868 --> 735 ng/L   Occlusive mesenteric ischemia (HCC) 06/19/2017   Osteoarthritis    PAD (peripheral artery disease) (HCC)    PAF (paroxysmal atrial fibrillation) (HCC)    a.) CHA2DS2-VASc = 6 (sex, CHF, HTN, DVT x2, aortic plaque). b.) rate/rhythm maintained on oral metoprolol tartrate; chronically anticoagulated with standard dose apixaban.   Palpitations    Peripheral edema    Personal history of other malignant  neoplasm of skin 06/01/2016   Pneumonia    Pre-diabetes    Primary osteoarthritis of both knees 06/13/2017   SBO (small bowel obstruction) (HCC) 08/06/2017   Skin cancer of nose    Superior mesenteric artery thrombosis (HCC)    Vitamin D deficiency     Past Surgical History:  Procedure Laterality Date   AMPUTATION Right 04/06/2022   Procedure: AMPUTATION ABOVE KNEE;  Surgeon: Annice Needy, MD;  Location: [facility];  Service: Vascular;  Laterality: Right;   AMPUTATION  12/07/2022   Procedure: AMPUTATION ABOVE KNEE REVISION WITH RESECTION OF FEMUR;  Surgeon: Annice Needy, MD;  Location: [facility];  Service: Vascular;;   AMPUTATION Left  02/21/2023   Procedure: AMPUTATION ABOVE KNEE;  Surgeon: Annice Needy, MD;  Location: [facility];  Service: General;  Laterality: Left;   AMPUTATION Left 03/22/2023   Procedure: AMPUTATION ABOVE KNEE STUMP DEBIDEMENT;  Surgeon: Annice Needy, MD;  Location: [facility];  Service: Vascular;  Laterality: Left;   AMPUTATION TOE Left 12/09/2019   Procedure: AMPUTATION TOE MPJ left;  Surgeon: Rosetta Posner, DPM;  Location: [facility];  Service: Podiatry;  Laterality: Left;   APPENDECTOMY     APPLICATION OF WOUND VAC  12/01/2021   Procedure: APPLICATION OF WOUND VAC;  Surgeon: Annice Needy, MD;  Location: [facility];  Service: Vascular;;   APPLICATION OF WOUND VAC Right 05/03/2022   Procedure: APPLICATION OF WOUND VAC;  Surgeon: Annice Needy, MD;  Location: [facility];  Service: Vascular;  Laterality: Right;   APPLICATION OF WOUND VAC Left 07/23/2022   Procedure: APPLICATION OF WOUND VAC;  Surgeon: Nada Libman, MD;  Location: [facility];  Service: Vascular;  Laterality: Left;  Prevena   APPLICATION OF WOUND VAC Right 08/28/2022   Procedure: APPLICATION OF WOUND VAC;  Surgeon: Annice Needy, MD;  Location: [facility];  Service: Vascular;  Laterality: Right;   APPLICATION OF WOUND VAC  12/07/2022   Procedure: APPLICATION OF WOUND VAC;  Surgeon: Annice Needy, MD;  Location: [facility];   Service: Vascular;;   APPLICATION OF WOUND VAC Left 03/28/2023   Procedure: LEFT STUMP WOUND VAC EXCHANGE;  Surgeon: Annice Needy, MD;  Location: [facility];  Service: Vascular;  Laterality: Left;   APPLICATION OF WOUND VAC Left 04/02/2023   Procedure: LEFT STUMP WOUND VAC EXCHANGE;  Surgeon: Annice Needy, MD;  Location: [facility];  Service: Vascular;  Laterality: Left;   APPLICATION OF WOUND VAC Left 04/05/2023   Procedure: LEFT STUMP WOUND VAC EXCHANGE;  Surgeon: Annice Needy, MD;  Location: [facility];  Service: Vascular;  Laterality: Left;   APPLICATION OF WOUND VAC Left 04/09/2023   Procedure: LEFT STUMP WOUND VAC EXCHANGE;  Surgeon: Annice Needy, MD;  Location: [facility];  Service: Vascular;  Laterality: Left;   APPLICATION OF WOUND VAC Left 04/16/2023   Procedure: LEFT STUMP WOUND VAC EXCHANGE;  Surgeon: Annice Needy, MD;  Location: [facility];  Service: Vascular;  Laterality: Left;   APPLICATION OF WOUND VAC Left 04/23/2023   Procedure: LEFT STUMP WOUND VAC EXCHANGE;  Surgeon: Renford Dills, MD;  Location: [facility];  Service: Vascular;  Laterality: Left;   APPLICATION OF WOUND VAC Left 04/30/2023   Procedure: LEFT STUMP WOUND VAC EXCHANGE;  Surgeon: Annice Needy, MD;  Location: [facility];  Service: Vascular;  Laterality: Left;   AXILLARY-FEMORAL BYPASS GRAFT Left 07/23/2022   Procedure: BYPASS GRAFT AXILLA-BIFEMORAL;  Surgeon: Nada Libman, MD;  Location: [facility];  Service: Vascular;  Laterality: Left;   CHOLECYSTECTOMY     COLON SURGERY     COLONOSCOPY WITH PROPOFOL N/A 03/23/2022   Procedure: COLONOSCOPY WITH PROPOFOL;  Surgeon: Wyline Mood, MD;  Location: [facility];  Service: Gastroenterology;  Laterality: N/A;  rectal bleed   CORONARY/GRAFT ACUTE MI REVASCULARIZATION N/A 02/15/2023   Procedure: Coronary/Graft Acute MI Revascularization;  Surgeon: Marykay Lex, MD;  Location: [facility];  Service: Cardiovascular;  Laterality: N/A;   EMBOLECTOMY OR THROMBECTOMY, WITH OR WITHOUT  CATHETER; FEMOROPOPLITEAL, AORTOILIAC ARTERY, BY LEG INCISION N/A 06/21/2020   Left - Decomp Fasciotomy Leg; Ant &/Or Lat Compart Only; Midline - Angiography, Visceral, Selective  Or Supraselective (With Or Without Flush Aortogram); Left - Revascularize, Endovasc, Open/Percut, Iliac Artery, Unilat, Initial Vessel; W/Translum Stent, W/Angioplasty; Right - Revascularize, Endovasc, Open/Percut, Iliac Artery, Ea Add`L Ipsilateral; W/Translumin Stent, W/Angioplasty; Location [facility];   ENDARTERECTOMY FEMORAL Left 12/01/2021   Procedure: ENDARTERECTOMY FEMORAL;  Surgeon: Annice Needy, MD;  Location: [facility];  Service: Vascular;  Laterality: Left;   ENDOVASCULAR REPAIR/STENT GRAFT Left 12/01/2021   Procedure: ENDOVASCULAR REPAIR/STENT GRAFT;  Surgeon: Annice Needy, MD;  Location: [facility];  Service: Cardiovascular;  Laterality: Left;   INCISION AND DRAINAGE OF WOUND Right 05/03/2022   Procedure: IRRIGATION AND DEBRIDEMENT RIGHT STUMP;  Surgeon: Annice Needy, MD;  Location: [facility];  Service: Vascular;  Laterality: Right;   IRRIGATION AND DEBRIDEMENT ABSCESS Right 08/28/2022   Procedure: AKA IRRIGATION AND DEBRIDEMENT ABSCESS;  Surgeon: Annice Needy, MD;  Location: [facility];  Service: Vascular;  Laterality: Right;   LAPAROTOMY N/A 08/08/2017   Procedure: EXPLORATORY LAPAROTOMY POSSIBLE BOWEL RESECTION;  Surgeon: Leafy Ro, MD;  Location: [facility];  Service: General;  Laterality: N/A;   LEFT HEART CATH AND CORONARY ANGIOGRAPHY N/A 05/17/2021   Procedure: LEFT HEART CATH AND CORONARY ANGIOGRAPHY;  Surgeon: Alwyn Pea, MD;  Location: [facility];  Service: Cardiovascular;  Laterality: N/A;   LEFT HEART CATH AND CORONARY ANGIOGRAPHY N/A 02/15/2023   Procedure: LEFT HEART CATH AND CORONARY ANGIOGRAPHY;  Surgeon: Marykay Lex, MD;  Location: [facility];  Service: Cardiovascular;  Laterality: N/A;   LOWER EXTREMITY ANGIOGRAPHY Left 11/17/2019   Procedure: LOWER EXTREMITY  ANGIOGRAPHY;  Surgeon: Annice Needy, MD;  Location: [facility];  Service: Cardiovascular;  Laterality: Left;   LOWER EXTREMITY ANGIOGRAPHY Right 12/13/2020   Procedure: LOWER EXTREMITY ANGIOGRAPHY;  Surgeon: Annice Needy, MD;  Location: [facility];  Service: Cardiovascular;  Laterality: Right;   LOWER EXTREMITY ANGIOGRAPHY Right 01/10/2021   Procedure: LOWER EXTREMITY ANGIOGRAPHY;  Surgeon: Annice Needy, MD;  Location: [facility];  Service: Cardiovascular;  Laterality: Right;   LOWER EXTREMITY ANGIOGRAPHY Right 01/24/2021   Procedure: LOWER EXTREMITY ANGIOGRAPHY;  Surgeon: Annice Needy, MD;  Location: [facility];  Service: Cardiovascular;  Laterality: Right;   LOWER EXTREMITY ANGIOGRAPHY Bilateral 06/28/2021   Procedure: Lower Extremity Angiography;  Surgeon: Annice Needy, MD;  Location: [facility];  Service: Cardiovascular;  Laterality: Bilateral;   LOWER EXTREMITY ANGIOGRAPHY Right 11/28/2021   Procedure: LOWER EXTREMITY ANGIOGRAPHY;  Surgeon: Annice Needy, MD;  Location: [facility];  Service: Cardiovascular;  Laterality: Right;   LOWER EXTREMITY ANGIOGRAPHY Left 11/30/2021   Procedure: Lower Extremity Angiography;  Surgeon: Annice Needy, MD;  Location: [facility];  Service: Cardiovascular;  Laterality: Left;   LOWER EXTREMITY ANGIOGRAPHY Right 02/22/2022   Procedure: Lower Extremity Angiography;  Surgeon: Annice Needy, MD;  Location: [facility];  Service: Cardiovascular;  Laterality: Right;   LOWER EXTREMITY ANGIOGRAPHY Right 03/13/2022   Procedure: Lower Extremity Angiography;  Surgeon: Annice Needy, MD;  Location: [facility];  Service: Cardiovascular;  Laterality: Right;   LOWER EXTREMITY ANGIOGRAPHY Right 03/24/2022   Procedure: Lower Extremity Angiography;  Surgeon: Annice Needy, MD;  Location: [facility];  Service: Cardiovascular;  Laterality: Right;   LOWER EXTREMITY ANGIOGRAPHY Left 01/17/2023    Procedure: Lower Extremity Angiography;  Surgeon: Annice Needy, MD;  Location: [facility];  Service:  Cardiovascular;  Laterality: Left;   LOWER EXTREMITY INTERVENTION N/A 11/19/2019   Procedure: LOWER EXTREMITY INTERVENTION;  Surgeon: Annice Needy, MD;  Location: [facility];  Service: Cardiovascular;  Laterality: N/A;   LOWER EXTREMITY INTERVENTION Bilateral 06/29/2021   Procedure: LOWER EXTREMITY INTERVENTION;  Surgeon: Annice Needy, MD;  Location: [facility];  Service: Cardiovascular;  Laterality: Bilateral;   LOWER EXTREMITY INTERVENTION Left 01/18/2023   Procedure: LOWER EXTREMITY INTERVENTION;  Surgeon: Annice Needy, MD;  Location: [facility];  Service: Cardiovascular;  Laterality: Left;   TEE WITHOUT CARDIOVERSION N/A 06/22/2017   Procedure: TRANSESOPHAGEAL ECHOCARDIOGRAM (TEE);  Surgeon: Iran Ouch, MD;  Location: [facility];  Service: Cardiovascular;  Laterality: N/A;   TOTAL KNEE ARTHROPLASTY Right 07/11/2018   Procedure: TOTAL KNEE ARTHROPLASTY;  Surgeon: Christena Flake, MD;  Location: [facility];  Service: Orthopedics;  Laterality: Right;   VAGINAL HYSTERECTOMY     VISCERAL ARTERY INTERVENTION N/A 06/20/2017   Procedure: Visceral Artery Intervention, possible aortic thrombectomy;  Surgeon: Annice Needy, MD;  Location: [facility];  Service: Cardiovascular;  Laterality: N/A;   VISCERAL ARTERY INTERVENTION N/A 01/28/2018   Procedure: VISCERAL ARTERY INTERVENTION;  Surgeon: Annice Needy, MD;  Location: [facility];  Service: Cardiovascular;  Laterality: N/A;     Social History   Tobacco Use   Smoking status: Former    Current packs/day: 0.00    Types: Cigarettes    Quit date: 2023    Years since quitting: 1.8   Smokeless tobacco: Never  Vaping Use   Vaping status: Never Used  Substance Use Topics   Alcohol use: No   Drug use: No      Family History  Problem Relation Age of Onset   Hypertension Mother    Heart  disease Mother    Heart attack Mother    Breast cancer Mother    Heart disease Father    Hypertension Father    Breast cancer Paternal Aunt      Allergies  Allergen Reactions   Gabapentin     Tremors    Bactrim [Sulfamethoxazole-Trimethoprim] Hives, Itching and Other (See Comments)    Family member states she has burning sensation from the inside out         REVIEW OF SYSTEMS (Negative unless checked)   Constitutional: [] Weight loss  [] Fever  [] Chills Cardiac: [] Chest pain   [] Chest pressure   [] Palpitations   [] Shortness of breath when laying flat   [] Shortness of breath at rest   [x] Shortness of breath with exertion. Vascular:  [] Pain in legs with walking   [x] Pain in legs at rest   [x] Pain in legs when laying flat   [] Claudication   [] Pain in feet when walking  [] Pain in feet at rest  [] Pain in feet when laying flat   [] History of DVT   [] Phlebitis   [] Swelling in legs   [] Varicose veins   [] Non-healing ulcers Pulmonary:   [] Uses home oxygen   [] Productive cough   [] Hemoptysis   [] Wheeze  [] COPD   [] Asthma Neurologic:  [] Dizziness  [] Blackouts   [] Seizures   [] History of stroke   [] History of TIA  [] Aphasia   [] Temporary blindness   [] Dysphagia   [] Weakness or numbness in arms   [] Weakness or numbness in legs Musculoskeletal:  [] Arthritis   [] Joint swelling   [x] Joint pain   [] Low back pain Hematologic:  [] Easy bruising  [] Easy bleeding   [x] Hypercoagulable state   []   Anemic   Gastrointestinal:  [] Blood in stool   [] Vomiting blood  [] Gastroesophageal reflux/heartburn   [] Abdominal pain Genitourinary:  [] Chronic kidney disease   [] Difficult urination  [] Frequent urination  [] Burning with urination   [] Hematuria Skin:  [] Rashes   [x] Ulcers   [x] Wounds Psychological:  [x] History of anxiety   [x]  History of major depression.    Physical Examination  BP 136/81 (BP Location: Right Arm)   Pulse 65   Resp 15  Gen:  WD/WN, NAD Head: [place]/AT, No temporalis wasting. Ear/Nose/Throat:  Hearing grossly intact, nares w/o erythema or drainage Eyes: Conjunctiva clear. Sclera non-icteric Neck: Supple.  Trachea midline Pulmonary:  Good air movement, no use of accessory muscles.  Cardiac: RRR, no JVD Vascular:  Vessel Right Left  Radial Palpable Palpable                          PT Not Palpable Not Palpable  DP Not Palpable Not Palpable   Gastrointestinal: large ventral hernia present Musculoskeletal: M/S 5/5 throughout.  Amputations with healed wounds at this point.   Neurologic: Sensation grossly intact in extremities.  Symmetrical.  Speech is fluent.  Psychiatric: Judgment intact, Mood & affect appropriate for pt's clinical situation. Dermatologic: No rashes or ulcers noted.  No cellulitis or open wounds.      Labs Recent Results (from the past 2160 hour(s))  Aerobic culture     Status: None   Collection Time: 07/12/23  3:30 AM  Result Value Ref Range   Aerobic Bacterial Culture Final report    Organism ID, Bacteria Comment     Comment: Multiple negative rods present, none predominant. This is indicative of a heavily colonized/contaminated specimen site. No further microbiological characterization will be done as it will not provide clinically relevant information. Heavy growth     Radiology No results found.  Assessment/Plan Chronic pain syndrome She has ongoing chronic pain and there is not much I can do to help that at this point.  She is on a large amount of medications by her primary care physician.  Believe she would benefit from a spinal cord stimulator and I have made some calls to try to get a referral in order for this.  They will be in contact with her.   Essential hypertension blood pressure control important in reducing the progression of atherosclerotic disease. On appropriate oral medications.     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  Even had an axillofemoral bypass  but this has occluded despite multiple interventions as well.  Her blood vessels were not unreconstructable and she is now status post bilateral above-knee amputation.  Ventral hernia with obstruction and without gangrene Very poor surgical candidate    Festus Barren, MD  09/14/2023 12:34 PM

## 2023-09-14 NOTE — Assessment & Plan Note (Signed)
Very poor surgical candidate

## 2023-09-26 ENCOUNTER — Other Ambulatory Visit: Payer: Self-pay

## 2023-09-26 ENCOUNTER — Emergency Department
Admission: EM | Admit: 2023-09-26 | Discharge: 2023-09-26 | Disposition: A | Discharge status: Home / Self Care | Payer: 59 | Attending: Emergency Medicine | Admitting: Emergency Medicine

## 2023-09-26 DIAGNOSIS — R110 Nausea: Secondary | ICD-10-CM | POA: Insufficient documentation

## 2023-09-26 DIAGNOSIS — Z7901 Long term (current) use of anticoagulants: Secondary | ICD-10-CM | POA: Insufficient documentation

## 2023-09-26 DIAGNOSIS — F419 Anxiety disorder, unspecified: Secondary | ICD-10-CM | POA: Insufficient documentation

## 2023-09-26 DIAGNOSIS — I10 Essential (primary) hypertension: Secondary | ICD-10-CM | POA: Insufficient documentation

## 2023-09-26 MED ORDER — ONDANSETRON HCL 4 MG PO TABS: 4.0000 mg | ORAL_TABLET | Freq: Every day | ORAL | 1 refills | Status: AC | PRN

## 2023-09-26 MED ORDER — ONDANSETRON 4 MG PO TBDP
4.0000 mg | ORAL_TABLET | Freq: Once | ORAL | Status: AC
  Administered 2023-09-26: 4 mg via ORAL
  Filled 2023-09-26: qty 1

## 2023-09-26 MED ORDER — HYDROXYZINE HCL 50 MG PO TABS: 50.0000 mg | ORAL_TABLET | Freq: Three times a day (TID) | ORAL | 0 refills | Status: AC | PRN

## 2023-09-26 NOTE — ED Provider Notes (Signed)
[facility] Provider Note    Event Date/Time   First MD Initiated Contact with Patient 09/26/23 1727     (approximate)   History   Anxiety   HPI  Carla [name] is a 62 y.o. female with PMH of HTN, AKI, prediabetes, A-fib, chronic pain, multiple blood clots on chronic anticoagulation, bilateral above-the-knee amputation, anxiety and depression presents for evaluation of shortness of breath. Patient states that earlier today she felt short of breath and nauseous. She says that her SOB has resolved now and she only feel nauseous.       Physical Exam   Triage Vital Signs: ED Triage Vitals  Encounter Vitals Group     BP 09/26/23 1643 123/76     Systolic BP Percentile --      Diastolic BP Percentile --      Pulse Rate 09/26/23 1643 99     Resp 09/26/23 1643 16     Temp 09/26/23 1643 98.6 F (37 C)     Temp Source 09/26/23 1643 Oral     SpO2 09/26/23 1643 93 %     Weight 09/26/23 1620 179 lb 14.3 oz (81.6 kg)     Height --      Head Circumference --      Peak Flow --      Pain Score 09/26/23 1620 0     Pain Loc --      Pain Education --      Exclude from Growth Chart --     Most recent vital signs: Vitals:   09/26/23 1643  BP: 123/76  Pulse: 99  Resp: 16  Temp: 98.6 F (37 C)  SpO2: 93%   General: Awake, no distress.  CV:  Good peripheral perfusion. RRR. Resp:  Normal effort. CTAB. Abd:  No distention.    ED Results / Procedures / Treatments   Labs (all labs ordered are listed, but only abnormal results are displayed) Labs Reviewed - No data to display   PROCEDURES:  Critical Care performed: No  Procedures   MEDICATIONS ORDERED IN ED: Medications  ondansetron (ZOFRAN-ODT) disintegrating tablet 4 mg (4 mg Oral Given 09/26/23 1754)     IMPRESSION / MDM / ASSESSMENT AND PLAN / ED COURSE  I reviewed the triage vital signs and the nursing notes.                             62 year old female presents for evaluation of  shortness of breath. VSS and NAD on exam.   Differential includes, but is not limited to, viral syndrome, bronchitis including COPD exacerbation, pneumonia, reactive airway disease including asthma, CHF including exacerbation with or without pulmonary/interstitial edema, pneumothorax, ACS, thoracic trauma, pulmonary embolism and anxiety.  Patient's presentation is most consistent with acute, uncomplicated illness.  EMS reports that patient has called frequently for anxiety attacks when her daughter leaves the home. I discussed this with the patient to get her perspective on whether she feels anxiety is the cause. She was unsure. I offered to do a full work up for SOB including labs, CXR and ECG but patient declined as she preferred to go home. She was given nausea medication while in the ED, and I will also send her a prescription for some. I am sending PRN anxiety medication as well. She has a follow up appointment with her PCP later this month. I advised her to discuss her symptoms with her  PCP as she may benefit from a daily anxiety medication. Patient was agreeable to plan, voiced understanding and was stable at discharge.      FINAL CLINICAL IMPRESSION(S) / ED DIAGNOSES   Final diagnoses:  Anxiety     Rx / DC Orders   ED Discharge Orders          Ordered    ondansetron (ZOFRAN) 4 MG tablet  Daily PRN        09/26/23 1801    hydrOXYzine (ATARAX) 50 MG tablet  3 times daily PRN        09/26/23 1801             Note:  This document was prepared using Dragon voice recognition software and may include unintentional dictation errors.   Cameron Ali, PA-C 09/26/23 Erick Colace, MD 09/26/23 [phone]

## 2023-09-26 NOTE — ED Notes (Signed)
Patient discharged from ED by provider. Discharge instructions reviewed with patient and all questions answered. Patient carried via stretcher from ED in NAD.

## 2023-09-26 NOTE — ED Triage Notes (Signed)
Arrives from home via [facility].  C/O panic attack today. Lives with daughter, and EMS reports patient frequently having anxiety attacks when daughter leaves the home.  Patient with history of COPD and panic attacks typically with c/o SOB.  138/80 61 100% sat Lungs CTA

## 2023-09-26 NOTE — Discharge Instructions (Signed)
You can take the zofran daily as needed for nausea. You can take the hydroxyzine as needed for anxiety.   Please follow up with your primary care doctor later this month to discuss your symptoms and possibly starting a daily anxiety medication.

## 2023-11-06 ENCOUNTER — Ambulatory Visit (INDEPENDENT_AMBULATORY_CARE_PROVIDER_SITE_OTHER): Payer: 59 | Admitting: Vascular Surgery

## 2023-11-30 ENCOUNTER — Other Ambulatory Visit: Payer: Self-pay | Admitting: Family Medicine

## 2023-11-30 DIAGNOSIS — M79605 Pain in left leg: Secondary | ICD-10-CM

## 2024-03-11 ENCOUNTER — Encounter (INDEPENDENT_AMBULATORY_CARE_PROVIDER_SITE_OTHER): Payer: Self-pay

## 2024-05-16 ENCOUNTER — Other Ambulatory Visit: Payer: Self-pay | Admitting: Family Medicine

## 2024-05-16 DIAGNOSIS — K439 Ventral hernia without obstruction or gangrene: Secondary | ICD-10-CM
